# Patient Record
Sex: Female | Born: 1955 | Race: White | Hispanic: No | Marital: Married | Smoking: Former smoker
Health system: Southern US, Community
[De-identification: ages and names within clinical notes are randomized; demographics above are authoritative.]

## PROBLEM LIST (undated history)

## (undated) DIAGNOSIS — J45909 Unspecified asthma, uncomplicated: Secondary | ICD-10-CM

## (undated) DIAGNOSIS — M810 Age-related osteoporosis without current pathological fracture: Secondary | ICD-10-CM

## (undated) DIAGNOSIS — K219 Gastro-esophageal reflux disease without esophagitis: Secondary | ICD-10-CM

## (undated) DIAGNOSIS — F32A Depression, unspecified: Secondary | ICD-10-CM

## (undated) DIAGNOSIS — Q613 Polycystic kidney, unspecified: Secondary | ICD-10-CM

## (undated) DIAGNOSIS — M109 Gout, unspecified: Secondary | ICD-10-CM

## (undated) DIAGNOSIS — I10 Essential (primary) hypertension: Secondary | ICD-10-CM

## (undated) DIAGNOSIS — M1990 Unspecified osteoarthritis, unspecified site: Secondary | ICD-10-CM

## (undated) DIAGNOSIS — F329 Major depressive disorder, single episode, unspecified: Secondary | ICD-10-CM

## (undated) DIAGNOSIS — J449 Chronic obstructive pulmonary disease, unspecified: Secondary | ICD-10-CM

## (undated) DIAGNOSIS — Z9889 Other specified postprocedural states: Secondary | ICD-10-CM

## (undated) DIAGNOSIS — R112 Nausea with vomiting, unspecified: Secondary | ICD-10-CM

## (undated) DIAGNOSIS — E785 Hyperlipidemia, unspecified: Secondary | ICD-10-CM

## (undated) DIAGNOSIS — I509 Heart failure, unspecified: Secondary | ICD-10-CM

## (undated) DIAGNOSIS — E213 Hyperparathyroidism, unspecified: Secondary | ICD-10-CM

## (undated) HISTORY — DX: Unspecified osteoarthritis, unspecified site: M19.90

## (undated) HISTORY — DX: Major depressive disorder, single episode, unspecified: F32.9

## (undated) HISTORY — PX: BILATERAL OOPHORECTOMY: SHX1221

## (undated) HISTORY — DX: Gastro-esophageal reflux disease without esophagitis: K21.9

## (undated) HISTORY — PX: KIDNEY TRANSPLANT: SHX239

## (undated) HISTORY — DX: Hyperlipidemia, unspecified: E78.5

## (undated) HISTORY — DX: Depression, unspecified: F32.A

## (undated) HISTORY — DX: Polycystic kidney, unspecified: Q61.3

## (undated) HISTORY — DX: Unspecified asthma, uncomplicated: J45.909

## (undated) HISTORY — DX: Essential (primary) hypertension: I10

## (undated) HISTORY — DX: Hyperparathyroidism, unspecified: E21.3

## (undated) HISTORY — DX: Gout, unspecified: M10.9

## (undated) HISTORY — DX: Age-related osteoporosis without current pathological fracture: M81.0

## (undated) NOTE — *Deleted (*Deleted)
Patient had a very large amount of bright red blood in her stool.   MD made aware.  Will continue to monitor closely.

---
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
## 1998-11-23 HISTORY — PX: CARPAL TUNNEL RELEASE: SHX101

## 1999-03-18 ENCOUNTER — Inpatient Hospital Stay (HOSPITAL_COMMUNITY): Admission: RE | Admit: 1999-03-18 | Discharge: 1999-03-18 | Payer: Self-pay | Admitting: *Deleted

## 1999-03-21 ENCOUNTER — Encounter (HOSPITAL_COMMUNITY): Admission: RE | Admit: 1999-03-21 | Discharge: 1999-06-19 | Payer: Self-pay | Admitting: Obstetrics

## 1999-05-25 ENCOUNTER — Inpatient Hospital Stay (HOSPITAL_COMMUNITY): Admission: AD | Admit: 1999-05-25 | Discharge: 1999-05-25 | Payer: Self-pay | Admitting: Obstetrics

## 1999-06-03 ENCOUNTER — Inpatient Hospital Stay (HOSPITAL_COMMUNITY): Admission: AD | Admit: 1999-06-03 | Discharge: 1999-06-03 | Payer: Self-pay | Admitting: *Deleted

## 1999-06-06 ENCOUNTER — Encounter: Payer: Self-pay | Admitting: Obstetrics

## 1999-06-06 ENCOUNTER — Encounter (HOSPITAL_COMMUNITY): Admission: RE | Admit: 1999-06-06 | Discharge: 1999-07-02 | Payer: Self-pay | Admitting: *Deleted

## 1999-06-20 ENCOUNTER — Encounter (HOSPITAL_COMMUNITY): Admission: RE | Admit: 1999-06-20 | Discharge: 1999-09-18 | Payer: Self-pay | Admitting: *Deleted

## 1999-07-08 ENCOUNTER — Observation Stay (HOSPITAL_COMMUNITY): Admission: AD | Admit: 1999-07-08 | Discharge: 1999-07-09 | Payer: Self-pay | Admitting: *Deleted

## 1999-07-21 ENCOUNTER — Inpatient Hospital Stay (HOSPITAL_COMMUNITY): Admission: AD | Admit: 1999-07-21 | Discharge: 1999-07-23 | Payer: Self-pay | Admitting: *Deleted

## 1999-07-24 ENCOUNTER — Inpatient Hospital Stay (HOSPITAL_COMMUNITY): Admission: AD | Admit: 1999-07-24 | Discharge: 1999-07-28 | Payer: Self-pay | Admitting: *Deleted

## 1999-08-12 ENCOUNTER — Encounter: Payer: Self-pay | Admitting: *Deleted

## 1999-08-29 ENCOUNTER — Encounter: Admission: RE | Admit: 1999-08-29 | Discharge: 1999-11-27 | Payer: Self-pay | Admitting: *Deleted

## 1999-09-16 ENCOUNTER — Encounter: Payer: Self-pay | Admitting: *Deleted

## 1999-09-23 ENCOUNTER — Encounter (HOSPITAL_COMMUNITY): Admission: RE | Admit: 1999-09-23 | Discharge: 1999-10-06 | Payer: Self-pay | Admitting: *Deleted

## 1999-09-23 ENCOUNTER — Encounter: Payer: Self-pay | Admitting: *Deleted

## 1999-10-03 ENCOUNTER — Inpatient Hospital Stay (HOSPITAL_COMMUNITY): Admission: AD | Admit: 1999-10-03 | Discharge: 1999-10-06 | Payer: Self-pay | Admitting: *Deleted

## 1999-10-04 ENCOUNTER — Encounter: Payer: Self-pay | Admitting: *Deleted

## 1999-10-07 ENCOUNTER — Encounter: Payer: Self-pay | Admitting: *Deleted

## 1999-10-07 ENCOUNTER — Encounter (HOSPITAL_COMMUNITY): Admission: RE | Admit: 1999-10-07 | Discharge: 1999-11-05 | Payer: Self-pay | Admitting: *Deleted

## 1999-10-10 ENCOUNTER — Encounter: Payer: Self-pay | Admitting: *Deleted

## 1999-10-21 ENCOUNTER — Encounter: Payer: Self-pay | Admitting: *Deleted

## 1999-10-28 ENCOUNTER — Inpatient Hospital Stay (HOSPITAL_COMMUNITY): Admission: AD | Admit: 1999-10-28 | Discharge: 1999-11-04 | Payer: Self-pay | Admitting: *Deleted

## 1999-10-28 ENCOUNTER — Encounter (INDEPENDENT_AMBULATORY_CARE_PROVIDER_SITE_OTHER): Payer: Self-pay | Admitting: Specialist

## 1999-11-06 ENCOUNTER — Inpatient Hospital Stay (HOSPITAL_COMMUNITY): Admission: AD | Admit: 1999-11-06 | Discharge: 1999-11-09 | Payer: Self-pay | Admitting: Obstetrics & Gynecology

## 1999-11-13 ENCOUNTER — Encounter: Admission: RE | Admit: 1999-11-13 | Discharge: 1999-11-13 | Payer: Self-pay | Admitting: Family Medicine

## 1999-11-20 ENCOUNTER — Encounter: Admission: RE | Admit: 1999-11-20 | Discharge: 1999-11-20 | Payer: Self-pay | Admitting: Family Medicine

## 1999-11-28 ENCOUNTER — Encounter: Admission: RE | Admit: 1999-11-28 | Discharge: 1999-11-28 | Payer: Self-pay | Admitting: Family Medicine

## 1999-12-04 ENCOUNTER — Ambulatory Visit (HOSPITAL_COMMUNITY): Admission: RE | Admit: 1999-12-04 | Discharge: 1999-12-04 | Payer: Self-pay | Admitting: Nephrology

## 1999-12-04 ENCOUNTER — Encounter: Payer: Self-pay | Admitting: Nephrology

## 1999-12-09 ENCOUNTER — Inpatient Hospital Stay (HOSPITAL_COMMUNITY): Admission: EM | Admit: 1999-12-09 | Discharge: 1999-12-09 | Payer: Self-pay | Admitting: *Deleted

## 1999-12-09 ENCOUNTER — Encounter (INDEPENDENT_AMBULATORY_CARE_PROVIDER_SITE_OTHER): Payer: Self-pay

## 2000-01-01 ENCOUNTER — Encounter: Admission: RE | Admit: 2000-01-01 | Discharge: 2000-01-01 | Payer: Self-pay | Admitting: Family Medicine

## 2000-03-09 ENCOUNTER — Encounter: Admission: RE | Admit: 2000-03-09 | Discharge: 2000-03-09 | Payer: Self-pay | Admitting: Critical Care Medicine

## 2000-03-09 ENCOUNTER — Encounter: Payer: Self-pay | Admitting: Nephrology

## 2000-03-18 ENCOUNTER — Encounter: Payer: Self-pay | Admitting: Nephrology

## 2000-03-18 ENCOUNTER — Encounter: Admission: RE | Admit: 2000-03-18 | Discharge: 2000-03-18 | Payer: Self-pay | Admitting: Nephrology

## 2000-04-29 ENCOUNTER — Emergency Department (HOSPITAL_COMMUNITY): Admission: EM | Admit: 2000-04-29 | Discharge: 2000-04-29 | Payer: Self-pay | Admitting: Emergency Medicine

## 2000-06-04 ENCOUNTER — Emergency Department (HOSPITAL_COMMUNITY): Admission: EM | Admit: 2000-06-04 | Discharge: 2000-06-04 | Payer: Self-pay | Admitting: Emergency Medicine

## 2000-06-04 ENCOUNTER — Encounter: Payer: Self-pay | Admitting: Emergency Medicine

## 2000-08-24 ENCOUNTER — Encounter: Admission: RE | Admit: 2000-08-24 | Discharge: 2000-08-24 | Payer: Self-pay | Admitting: Family Medicine

## 2001-05-17 ENCOUNTER — Encounter: Admission: RE | Admit: 2001-05-17 | Discharge: 2001-05-17 | Payer: Self-pay | Admitting: Family Medicine

## 2001-05-18 ENCOUNTER — Encounter: Admission: RE | Admit: 2001-05-18 | Discharge: 2001-05-18 | Payer: Self-pay | Admitting: Sports Medicine

## 2001-05-18 ENCOUNTER — Encounter: Payer: Self-pay | Admitting: Sports Medicine

## 2001-05-19 ENCOUNTER — Encounter: Admission: RE | Admit: 2001-05-19 | Discharge: 2001-05-19 | Payer: Self-pay | Admitting: Family Medicine

## 2001-05-20 ENCOUNTER — Encounter: Payer: Self-pay | Admitting: Sports Medicine

## 2001-05-20 ENCOUNTER — Encounter: Admission: RE | Admit: 2001-05-20 | Discharge: 2001-05-20 | Payer: Self-pay | Admitting: Sports Medicine

## 2002-01-24 ENCOUNTER — Encounter: Payer: Self-pay | Admitting: Emergency Medicine

## 2002-01-24 ENCOUNTER — Emergency Department (HOSPITAL_COMMUNITY): Admission: EM | Admit: 2002-01-24 | Discharge: 2002-01-25 | Payer: Self-pay | Admitting: Emergency Medicine

## 2002-01-25 ENCOUNTER — Encounter: Payer: Self-pay | Admitting: Emergency Medicine

## 2002-02-07 ENCOUNTER — Encounter: Payer: Self-pay | Admitting: Nephrology

## 2002-02-07 ENCOUNTER — Encounter: Admission: RE | Admit: 2002-02-07 | Discharge: 2002-02-07 | Payer: Self-pay | Admitting: Nephrology

## 2002-04-25 ENCOUNTER — Other Ambulatory Visit: Admission: RE | Admit: 2002-04-25 | Discharge: 2002-04-25 | Payer: Self-pay | Admitting: Obstetrics & Gynecology

## 2002-05-01 ENCOUNTER — Encounter: Payer: Self-pay | Admitting: Obstetrics & Gynecology

## 2002-05-01 ENCOUNTER — Ambulatory Visit (HOSPITAL_COMMUNITY): Admission: RE | Admit: 2002-05-01 | Discharge: 2002-05-01 | Payer: Self-pay | Admitting: Obstetrics & Gynecology

## 2002-10-18 ENCOUNTER — Encounter: Payer: Self-pay | Admitting: Emergency Medicine

## 2002-10-18 ENCOUNTER — Emergency Department (HOSPITAL_COMMUNITY): Admission: EM | Admit: 2002-10-18 | Discharge: 2002-10-18 | Payer: Self-pay | Admitting: Emergency Medicine

## 2002-10-25 ENCOUNTER — Encounter: Payer: Self-pay | Admitting: Emergency Medicine

## 2002-10-25 ENCOUNTER — Emergency Department (HOSPITAL_COMMUNITY): Admission: EM | Admit: 2002-10-25 | Discharge: 2002-10-25 | Payer: Self-pay | Admitting: Emergency Medicine

## 2002-10-27 ENCOUNTER — Encounter: Payer: Self-pay | Admitting: Internal Medicine

## 2002-10-27 ENCOUNTER — Inpatient Hospital Stay (HOSPITAL_COMMUNITY): Admission: AD | Admit: 2002-10-27 | Discharge: 2002-10-31 | Payer: Self-pay | Admitting: Internal Medicine

## 2002-10-28 ENCOUNTER — Encounter: Payer: Self-pay | Admitting: Internal Medicine

## 2003-06-05 ENCOUNTER — Encounter: Payer: Self-pay | Admitting: Obstetrics & Gynecology

## 2003-06-05 ENCOUNTER — Ambulatory Visit (HOSPITAL_COMMUNITY): Admission: RE | Admit: 2003-06-05 | Discharge: 2003-06-05 | Payer: Self-pay | Admitting: Obstetrics & Gynecology

## 2003-06-08 ENCOUNTER — Encounter: Admission: RE | Admit: 2003-06-08 | Discharge: 2003-06-08 | Payer: Self-pay | Admitting: Rheumatology

## 2003-06-08 ENCOUNTER — Encounter: Payer: Self-pay | Admitting: Rheumatology

## 2003-06-12 ENCOUNTER — Encounter: Payer: Self-pay | Admitting: Rheumatology

## 2003-06-12 ENCOUNTER — Encounter: Admission: RE | Admit: 2003-06-12 | Discharge: 2003-06-12 | Payer: Self-pay | Admitting: Rheumatology

## 2003-07-09 ENCOUNTER — Encounter (HOSPITAL_COMMUNITY): Admission: RE | Admit: 2003-07-09 | Discharge: 2003-10-07 | Payer: Self-pay | Admitting: Nephrology

## 2003-08-30 ENCOUNTER — Ambulatory Visit (HOSPITAL_COMMUNITY): Admission: RE | Admit: 2003-08-30 | Discharge: 2003-08-30 | Payer: Self-pay | Admitting: Obstetrics & Gynecology

## 2003-08-30 ENCOUNTER — Encounter: Payer: Self-pay | Admitting: Obstetrics & Gynecology

## 2003-10-15 ENCOUNTER — Emergency Department (HOSPITAL_COMMUNITY): Admission: EM | Admit: 2003-10-15 | Discharge: 2003-10-16 | Payer: Self-pay | Admitting: Emergency Medicine

## 2003-10-17 ENCOUNTER — Ambulatory Visit (HOSPITAL_BASED_OUTPATIENT_CLINIC_OR_DEPARTMENT_OTHER): Admission: RE | Admit: 2003-10-17 | Discharge: 2003-10-17 | Payer: Self-pay | Admitting: *Deleted

## 2004-04-27 ENCOUNTER — Emergency Department (HOSPITAL_COMMUNITY): Admission: EM | Admit: 2004-04-27 | Discharge: 2004-04-27 | Payer: Self-pay | Admitting: Emergency Medicine

## 2004-12-23 ENCOUNTER — Encounter: Admission: RE | Admit: 2004-12-23 | Discharge: 2004-12-23 | Payer: Self-pay | Admitting: Internal Medicine

## 2005-07-15 ENCOUNTER — Emergency Department (HOSPITAL_COMMUNITY): Admission: EM | Admit: 2005-07-15 | Discharge: 2005-07-15 | Payer: Self-pay | Admitting: Emergency Medicine

## 2006-01-10 ENCOUNTER — Emergency Department (HOSPITAL_COMMUNITY): Admission: EM | Admit: 2006-01-10 | Discharge: 2006-01-10 | Payer: Self-pay | Admitting: Family Medicine

## 2006-06-29 IMAGING — CR DG CHEST 2V
3 series · 3 of 3 positions shown · non-contrast
Comparison: none

CLINICAL DATA: Chest pain. 
 CHEST ? TWO VIEW:
 Two views of the chest are compared to a chest x-ray of 05/09/03.  Basilar linear opacities are noted consistent with atelectasis or scarring.  No effusion is seen.  The heart is within normal limits in size.

[view not recorded (1 of 3)]
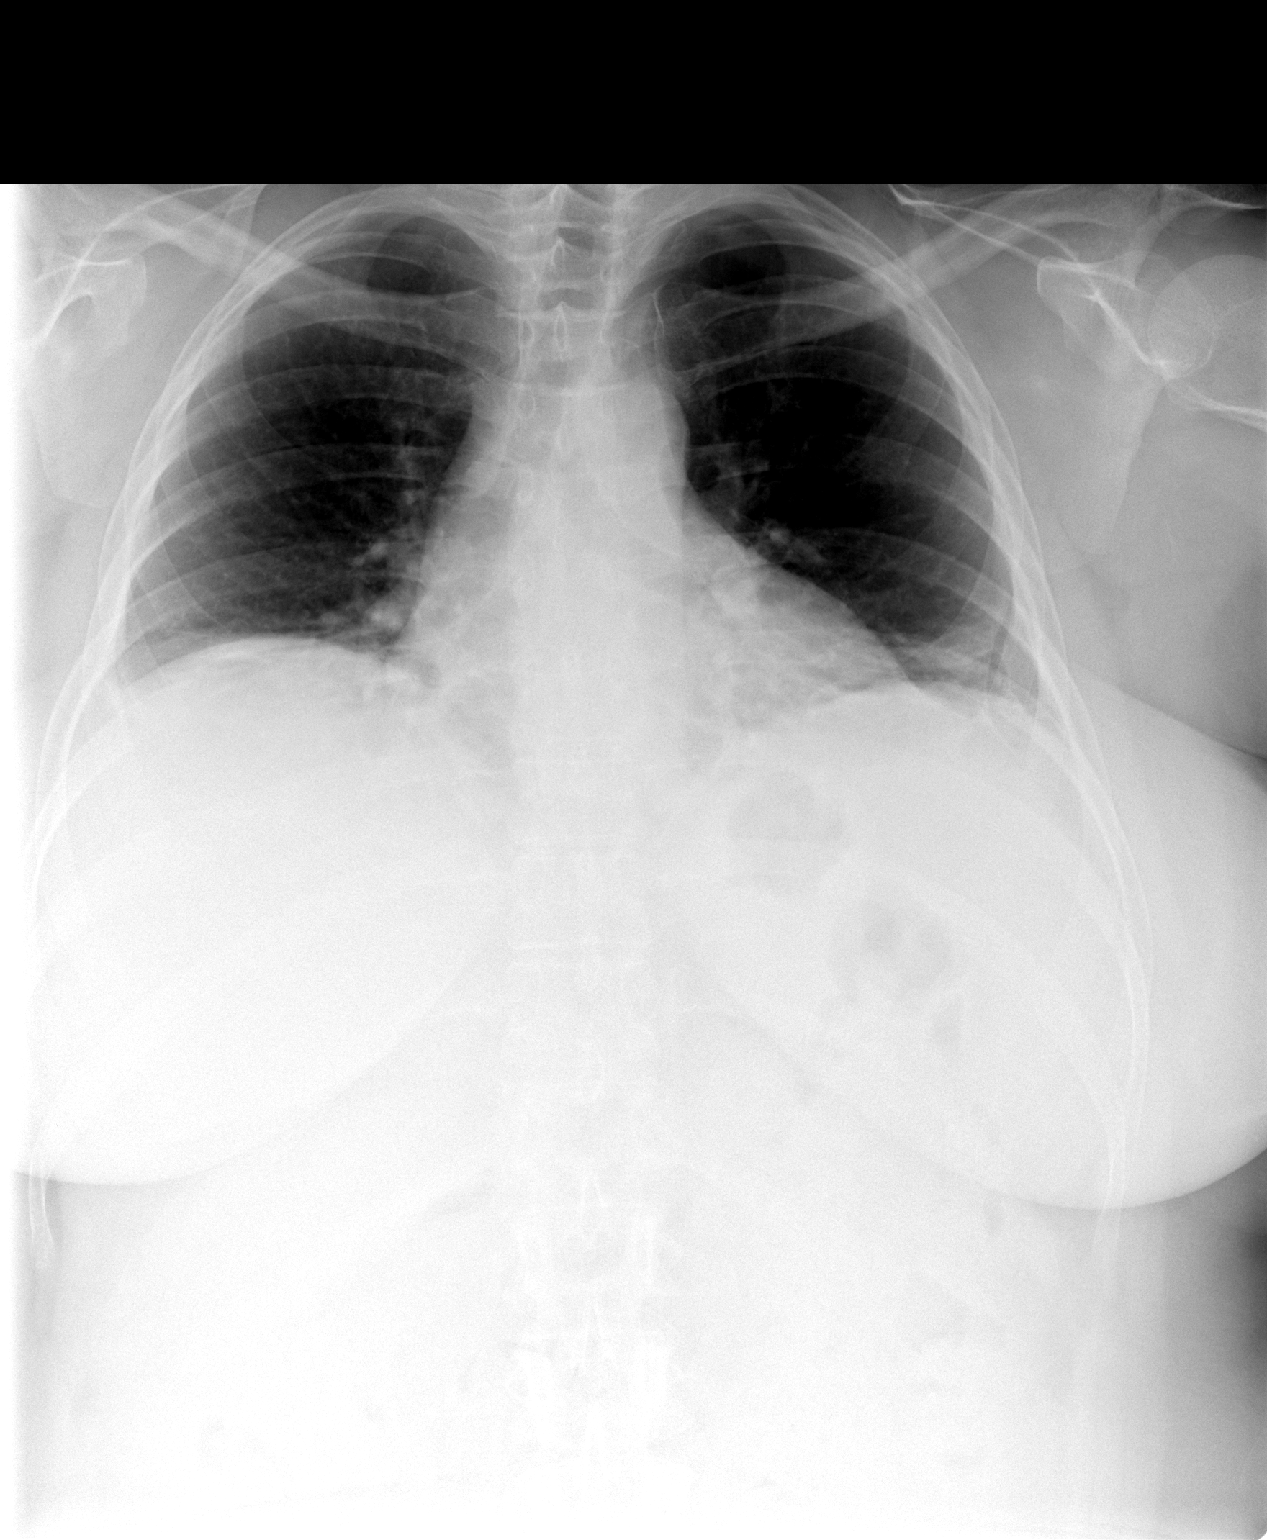

[view not recorded (2 of 3)]
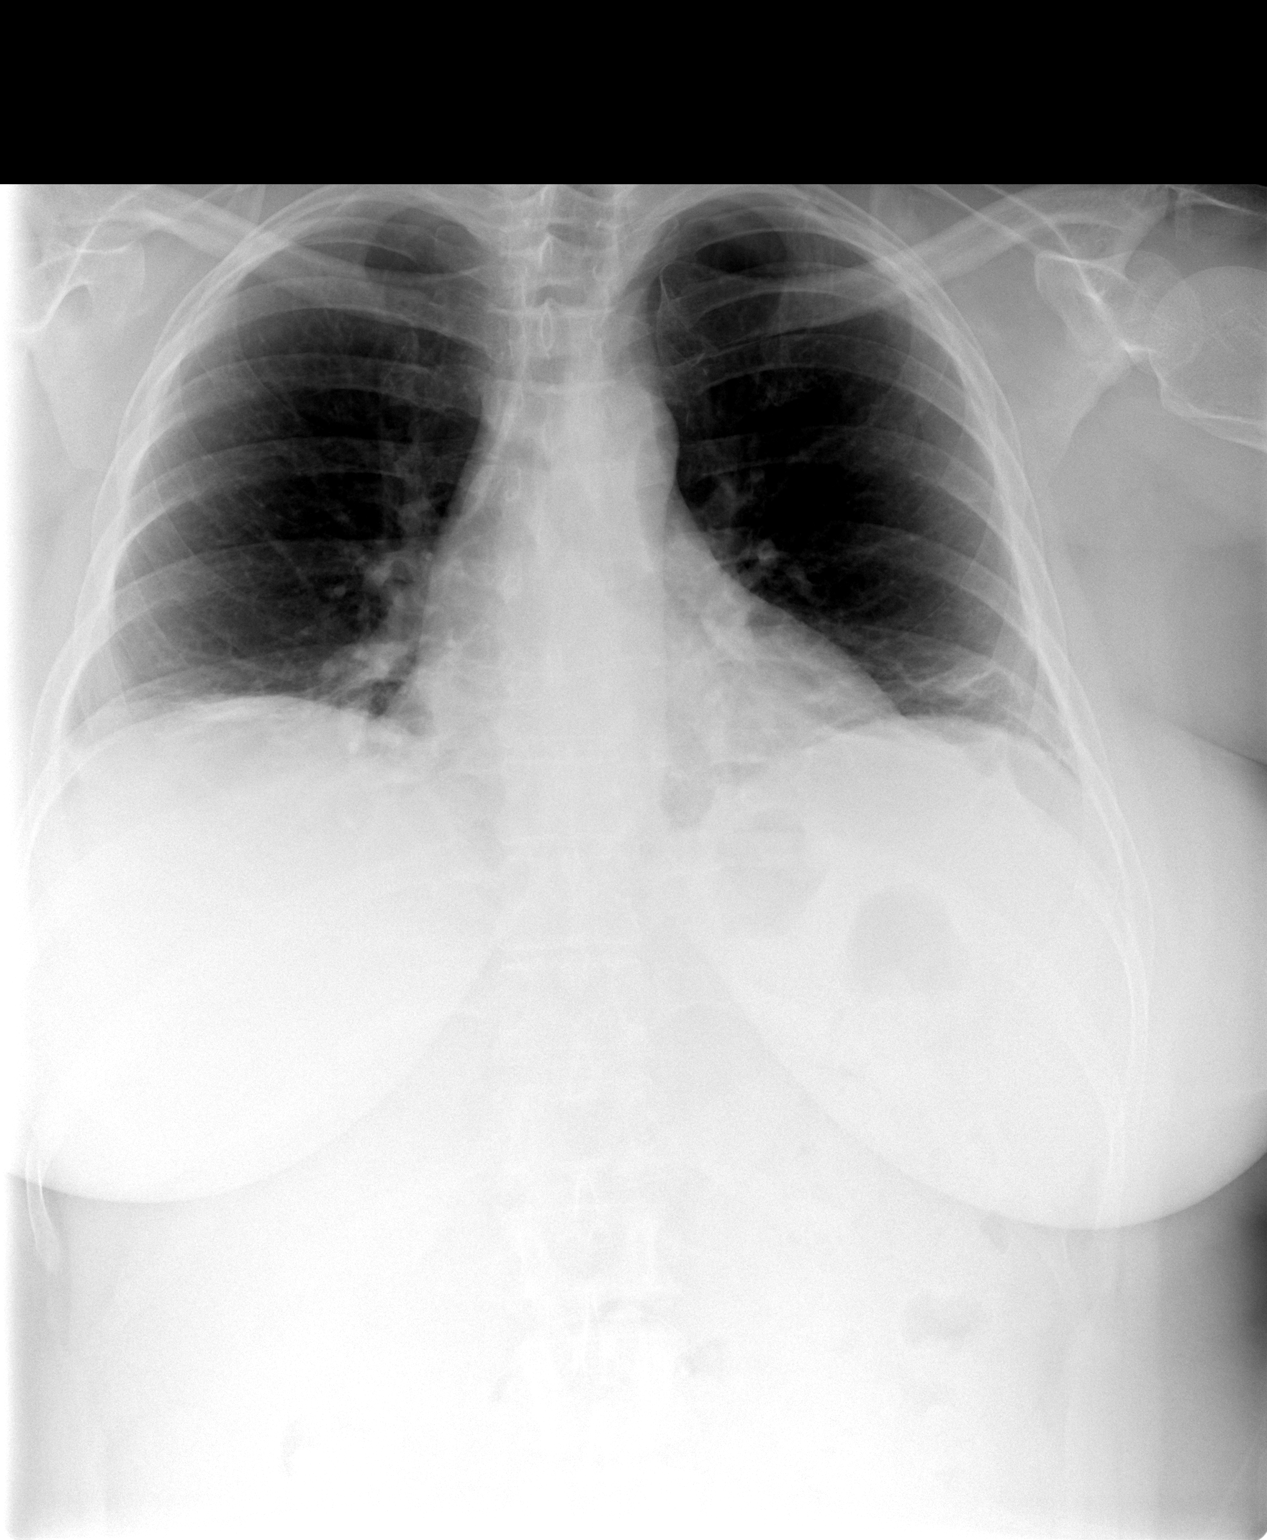

[view not recorded (3 of 3)]
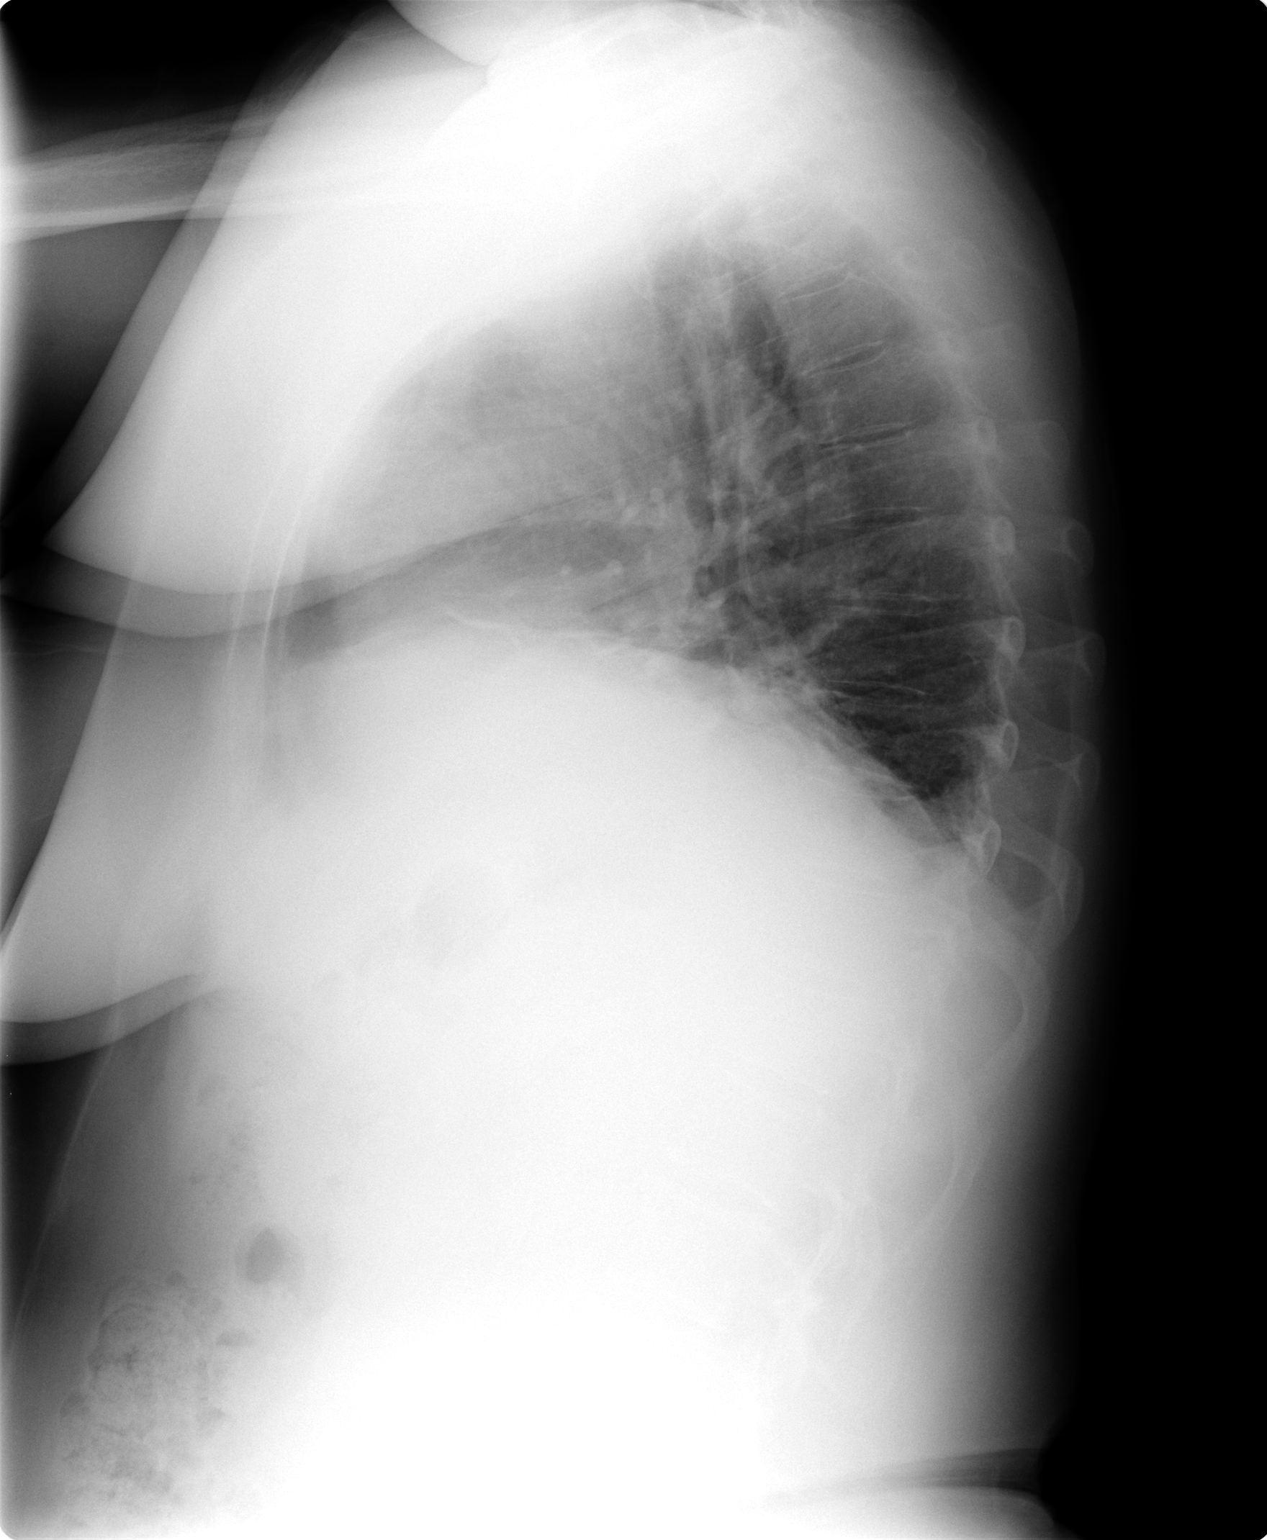

[3 of 3 positions shown; findings below may reference images not displayed]

IMPRESSION: Basilar linear opacities consistent with atelectasis or scarring.  Suboptimal inspiration.

## 2006-07-28 ENCOUNTER — Encounter: Admission: RE | Admit: 2006-07-28 | Discharge: 2006-07-28 | Payer: Self-pay | Admitting: Family Medicine

## 2006-11-11 ENCOUNTER — Encounter: Admission: RE | Admit: 2006-11-11 | Discharge: 2006-11-11 | Payer: Self-pay | Admitting: Family Medicine

## 2006-11-12 ENCOUNTER — Ambulatory Visit (HOSPITAL_COMMUNITY): Admission: RE | Admit: 2006-11-12 | Discharge: 2006-11-12 | Payer: Self-pay | Admitting: Nephrology

## 2006-12-22 ENCOUNTER — Encounter: Admission: RE | Admit: 2006-12-22 | Discharge: 2006-12-22 | Payer: Self-pay | Admitting: Internal Medicine

## 2008-02-01 IMAGING — CR DG LUMBAR SPINE COMPLETE 4+V
5 series · 5 of 5 positions shown · non-contrast
Comparison: None.

LUMBAR SPINE - 4  VIEW:

CLINICAL DATA: Right-sided back pain running into the right hip and leg.

[t l-spine a.p.]
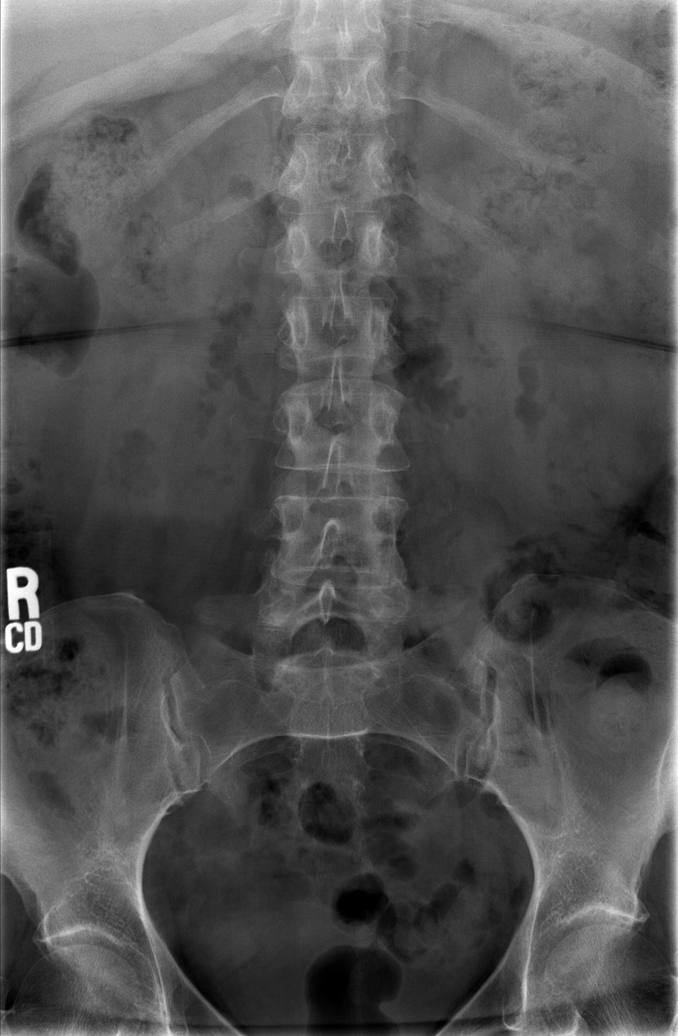

[t l-spine oblique exposure (1 of 2)]
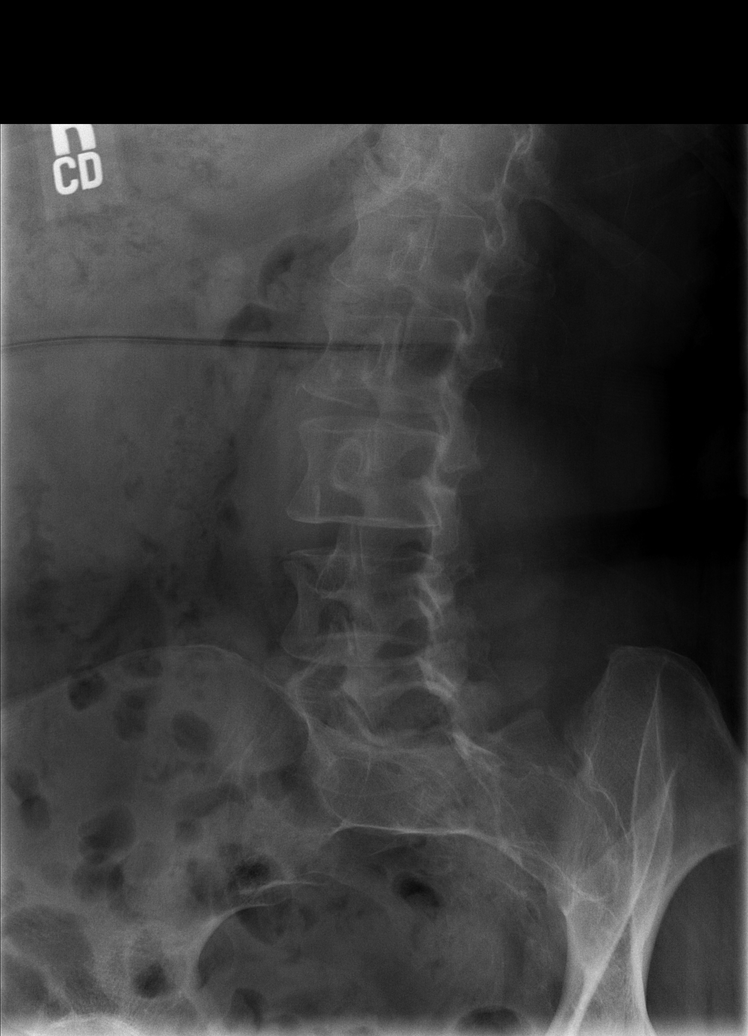

[t l-spine oblique exposure (2 of 2)]
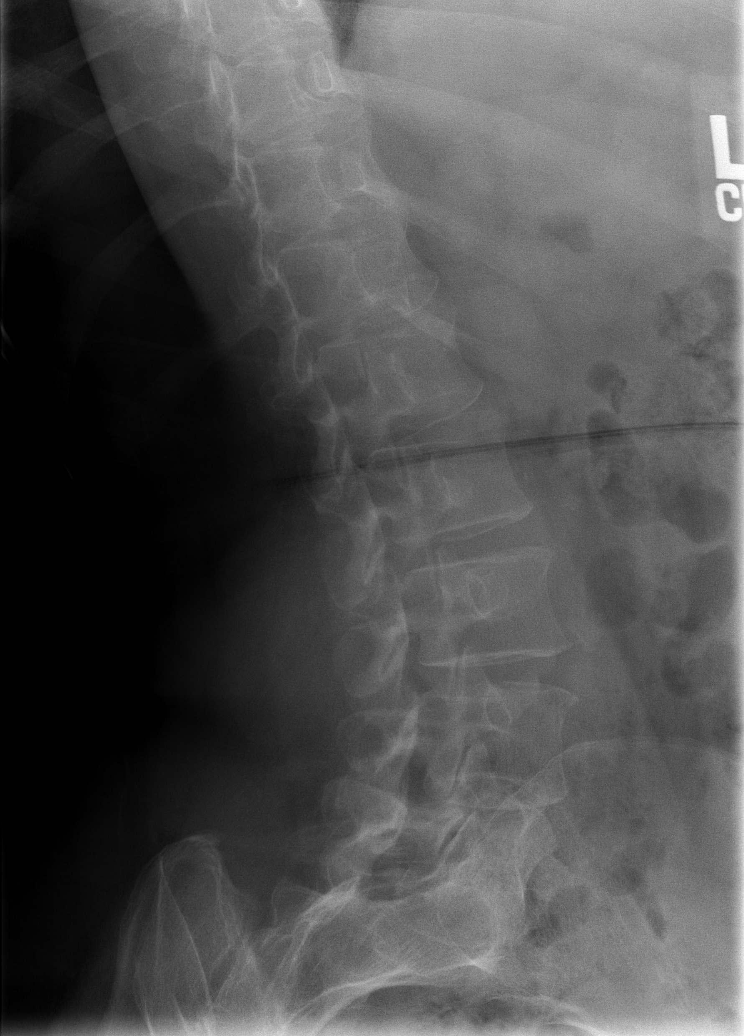

[t l-spine lat]
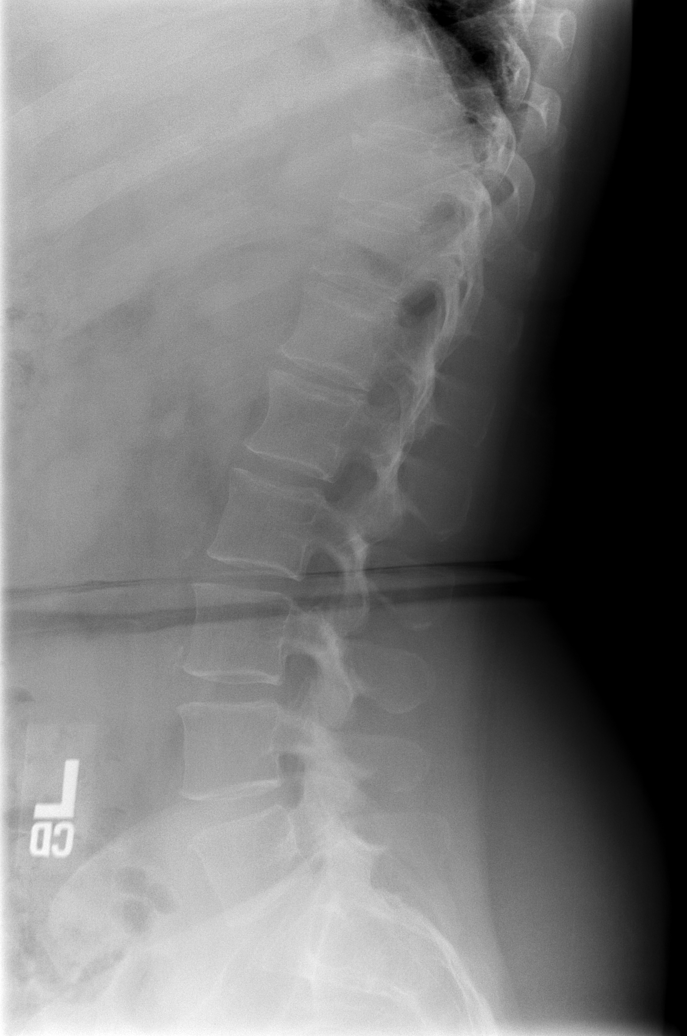

[t l-spine l5-s1 spot]
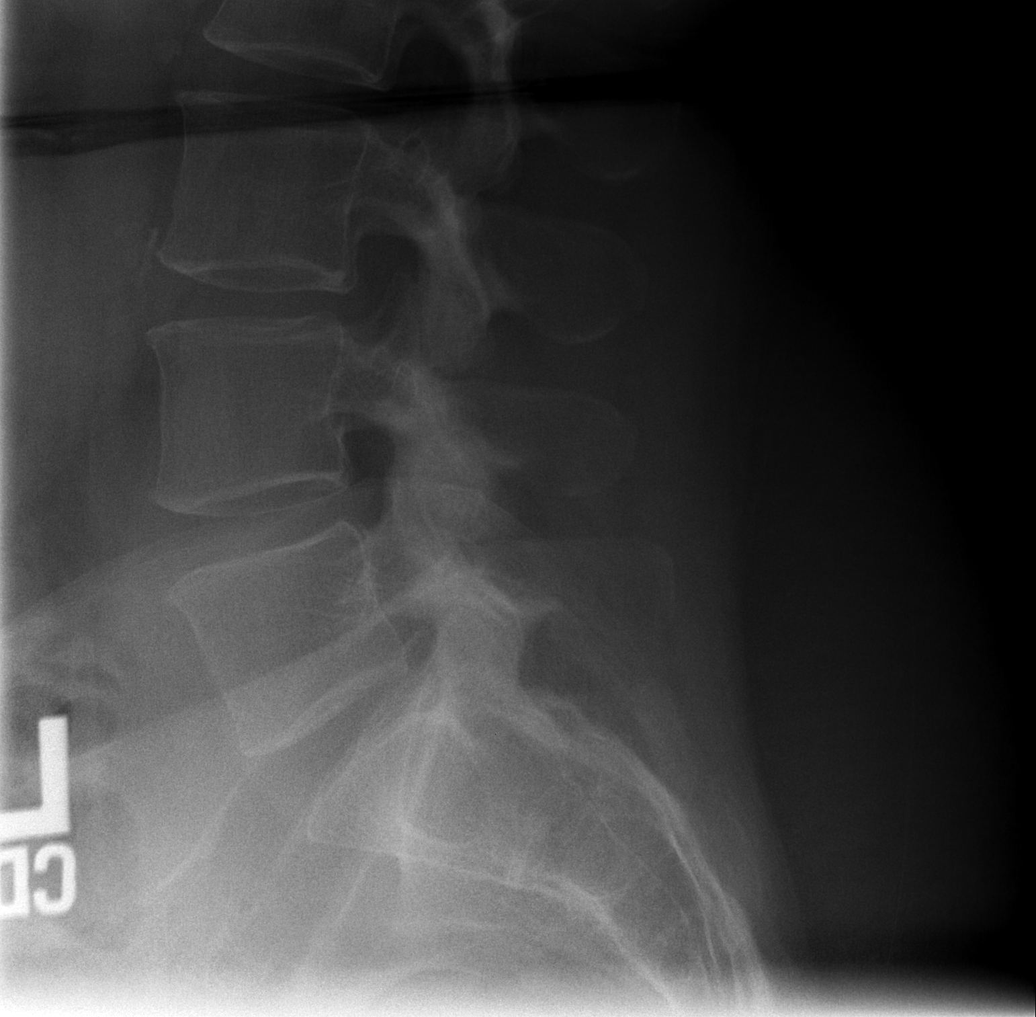

[5 of 5 positions shown; findings below may reference images not displayed]

FINDINGS: There are 5 nonrib-bearing lumbar type vertebral bodies.  Alignment
is normal and intervertebral disc spaces are preserved.  SI joints are
unremarkable.
IMPRESSION: No acute bony abnormality.

## 2008-02-01 IMAGING — CR DG HIP COMPLETE 2+V*R*
2 series · 2 of 2 positions shown · non-contrast
Comparison: none

CLINICAL DATA: Right hip pain

RIGHT HIP - 2 VIEW:

[t hip ap right]
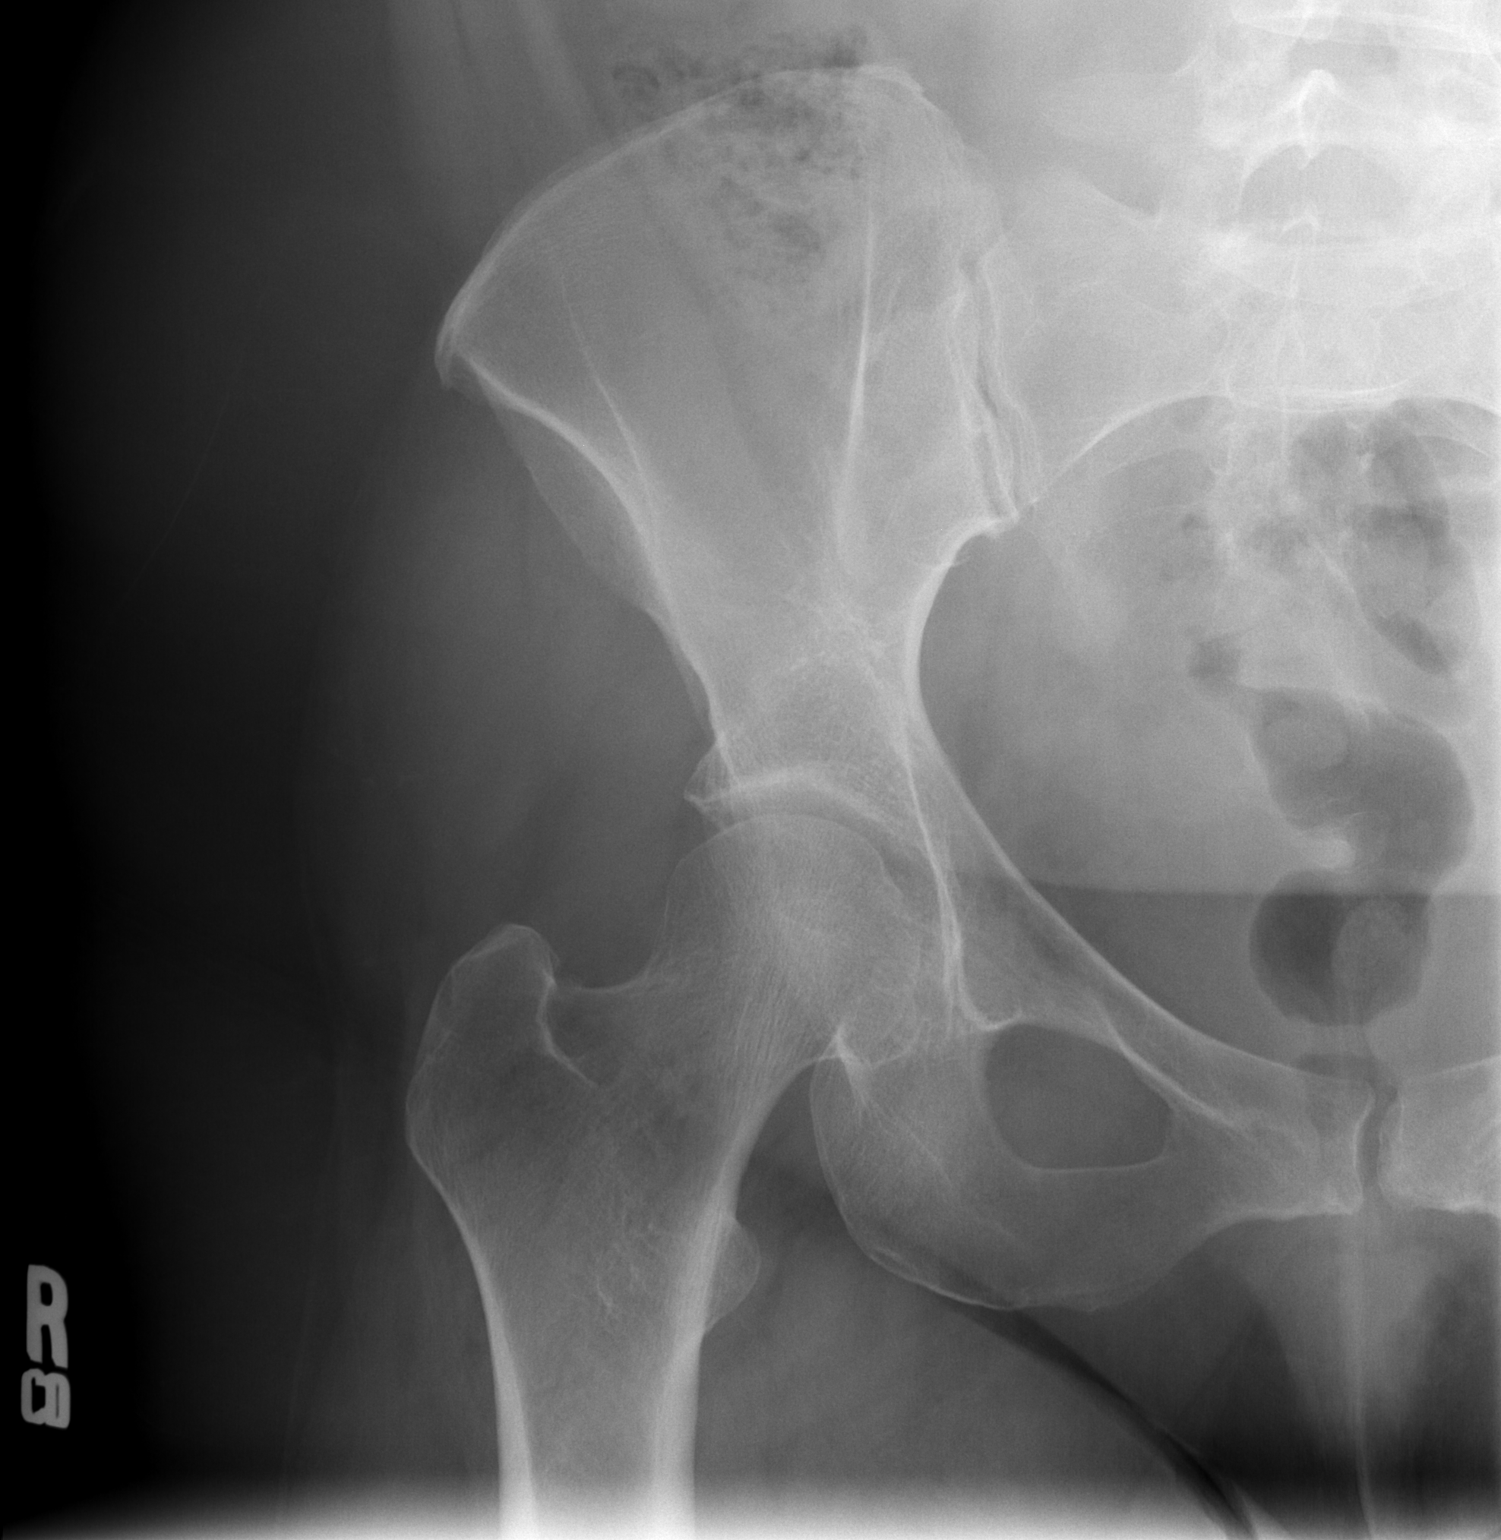

[t hip frog leg right]
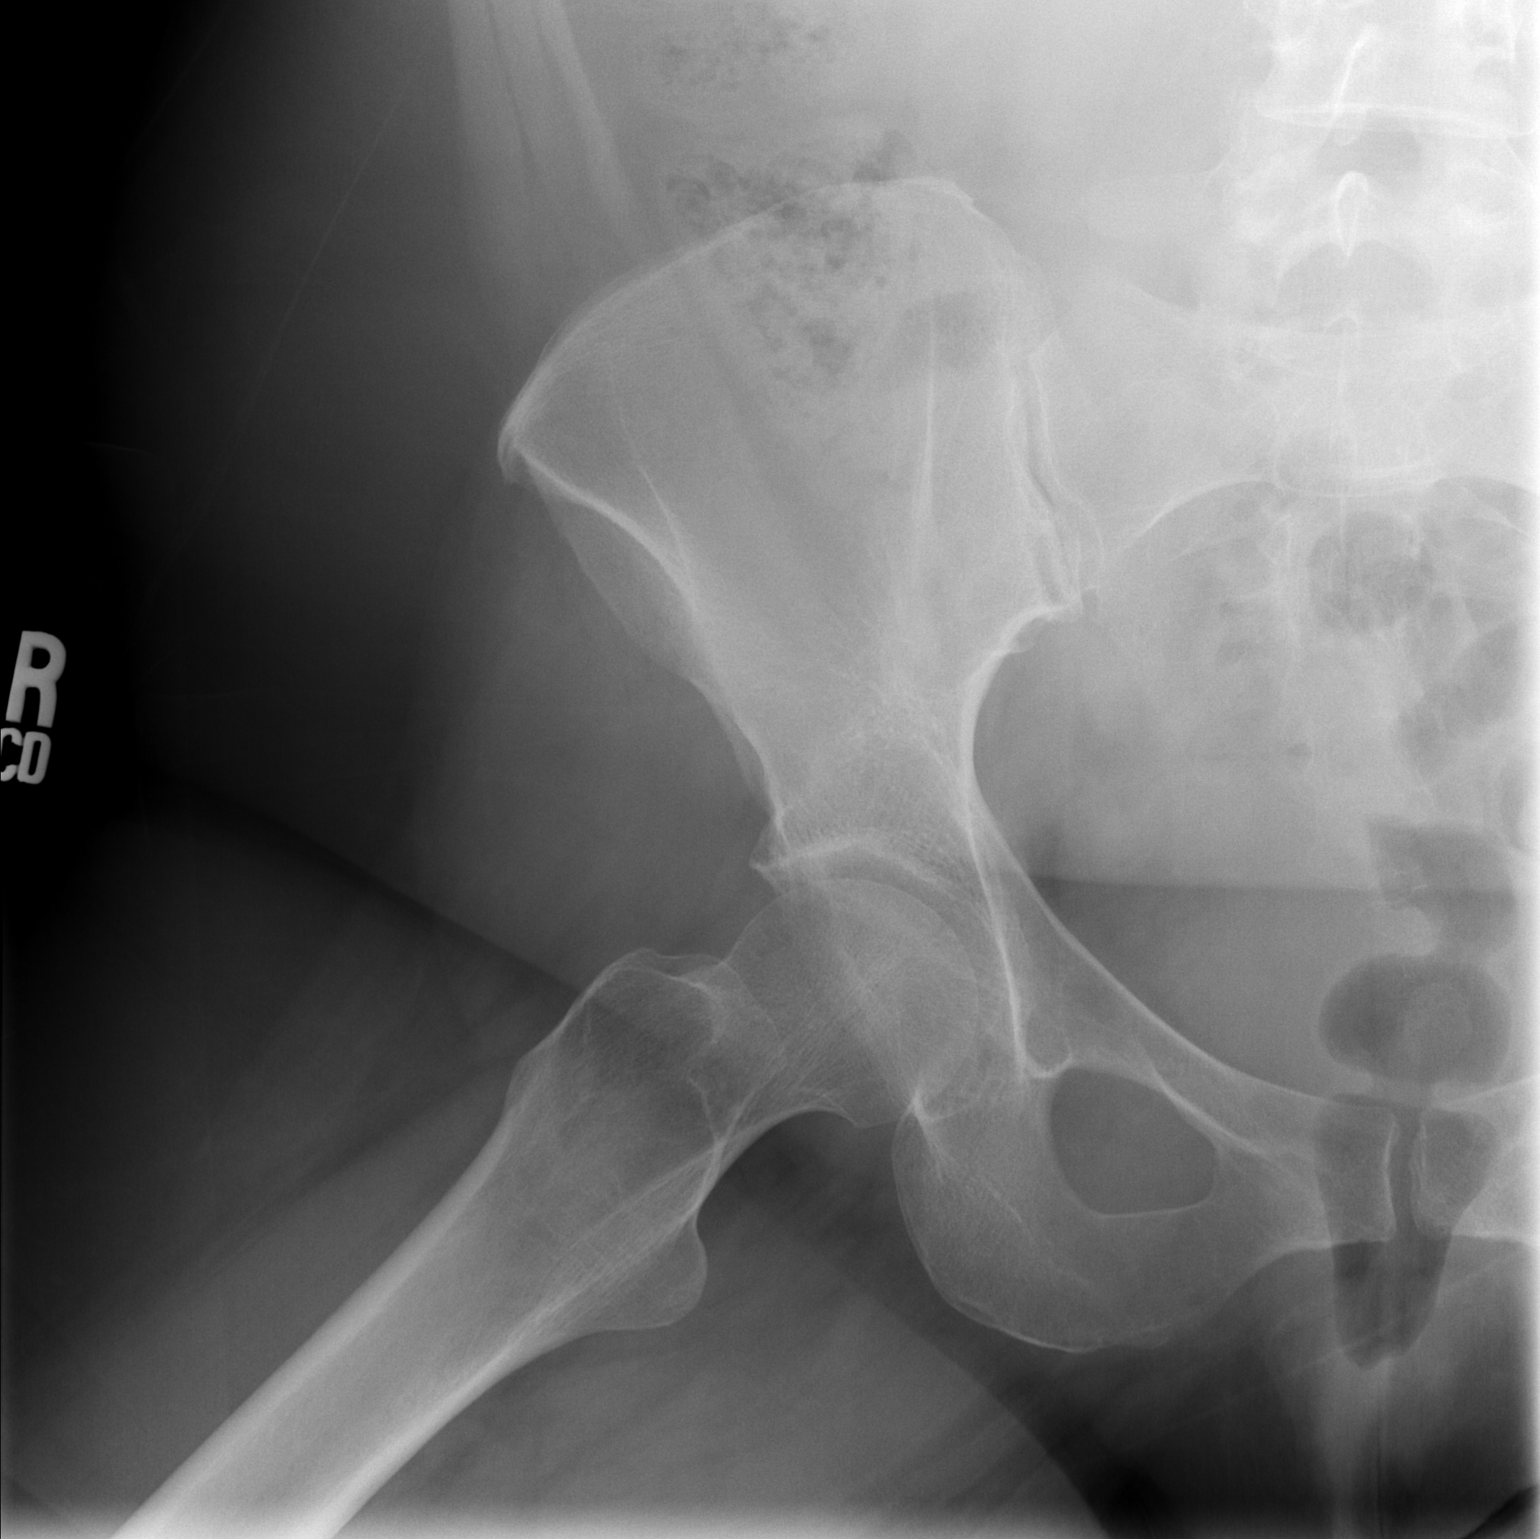

[2 of 2 positions shown; findings below may reference images not displayed]

FINDINGS: AP and frogleg lateral views of the right hip show no acute fracture.
Joint space is preserved. No focal bony abnormality identified. The right SI
joint is unremarkable.
IMPRESSION: No findings to explain this patient's history of pain.

## 2008-05-17 IMAGING — CR DG RIBS 2V*R*
2 series · 2 of 2 positions shown · non-contrast
Comparison: [REDACTED] chest x-ray 12/23/04.
COMPARISON: Chest x-ray 11/11/06.

CLINICAL DATA: Right pleuritic chest pain.  No trauma. 
 DIAGNOSTIC CHEST - 2 VIEW:

[w ribs ap/pa lower right]
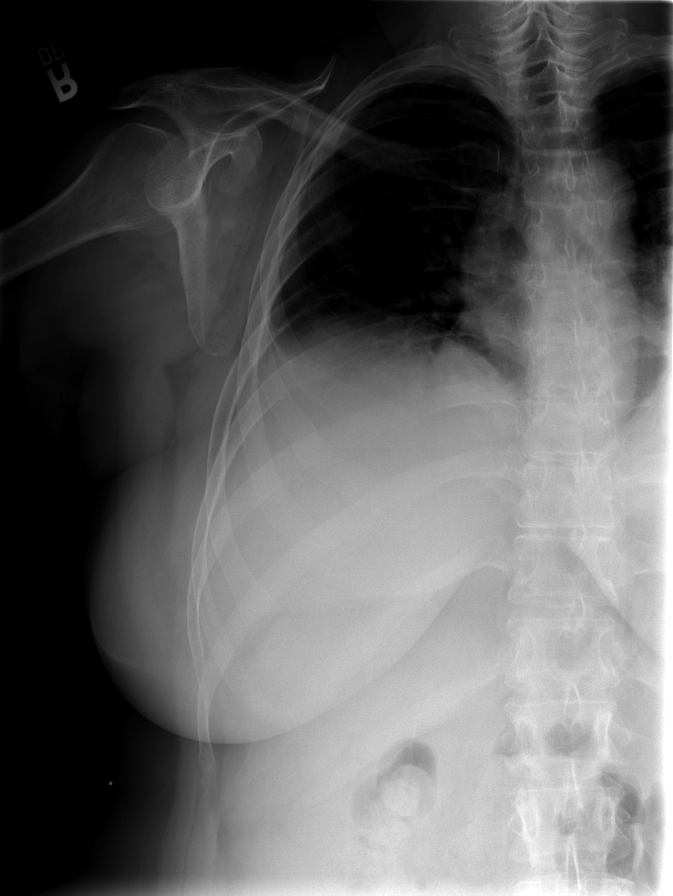

[w ribs oblique right]
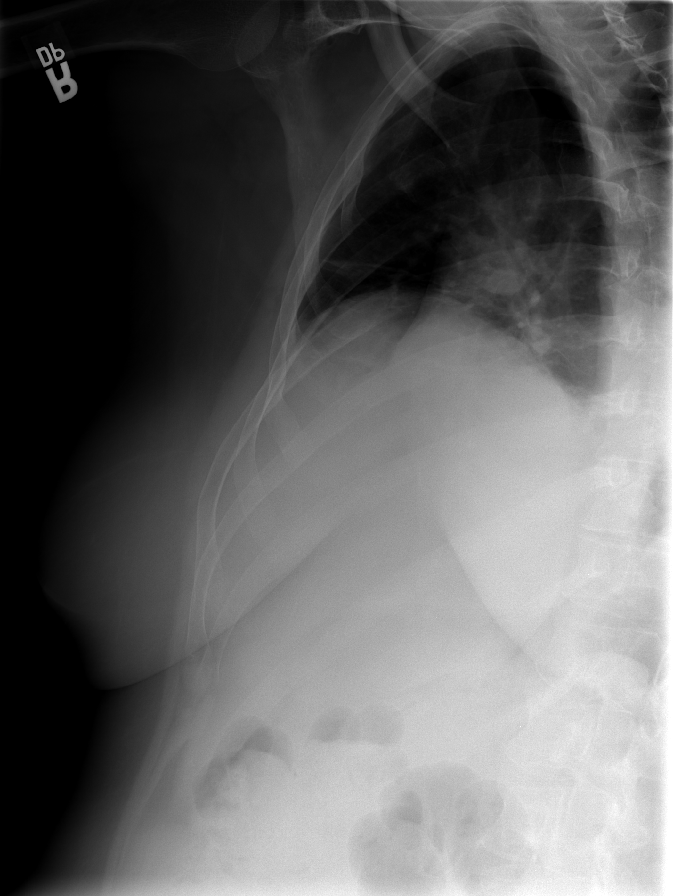

[2 of 2 positions shown; findings below may reference images not displayed]

FINDINGS: Lesser yet persistent linear atelectasis or scarring is seen at the bilateral lung bases with submaximal inspiration and the lungs otherwise clear.  No pneumothorax is seen.  Heart size remains normal.  Mediastinum, hila, pleural remain normal.
IMPRESSION: 1.  Submaximal inspiration with lesser bibasilar linear atelectasis or scarring. 
 2.  Otherwise no active disease. 
 DIAGNOSTIC RIGHT RIBS ? 2 VIEW:
FINDINGS: The heart size and mediastinal contours are within normal limits.  Both lungs are clear.  The visualized skeletal structures are unremarkable.  Region of maximal symptoms marked with surface BB.
IMPRESSION: Negative.

## 2008-05-17 IMAGING — CR DG CHEST 2V
3 series · 3 of 3 positions shown · non-contrast
Comparison: [REDACTED] chest x-ray 12/23/04.
COMPARISON: Chest x-ray 11/11/06.

CLINICAL DATA: Right pleuritic chest pain.  No trauma. 
 DIAGNOSTIC CHEST - 2 VIEW:

[w chest pa (1 of 2)]
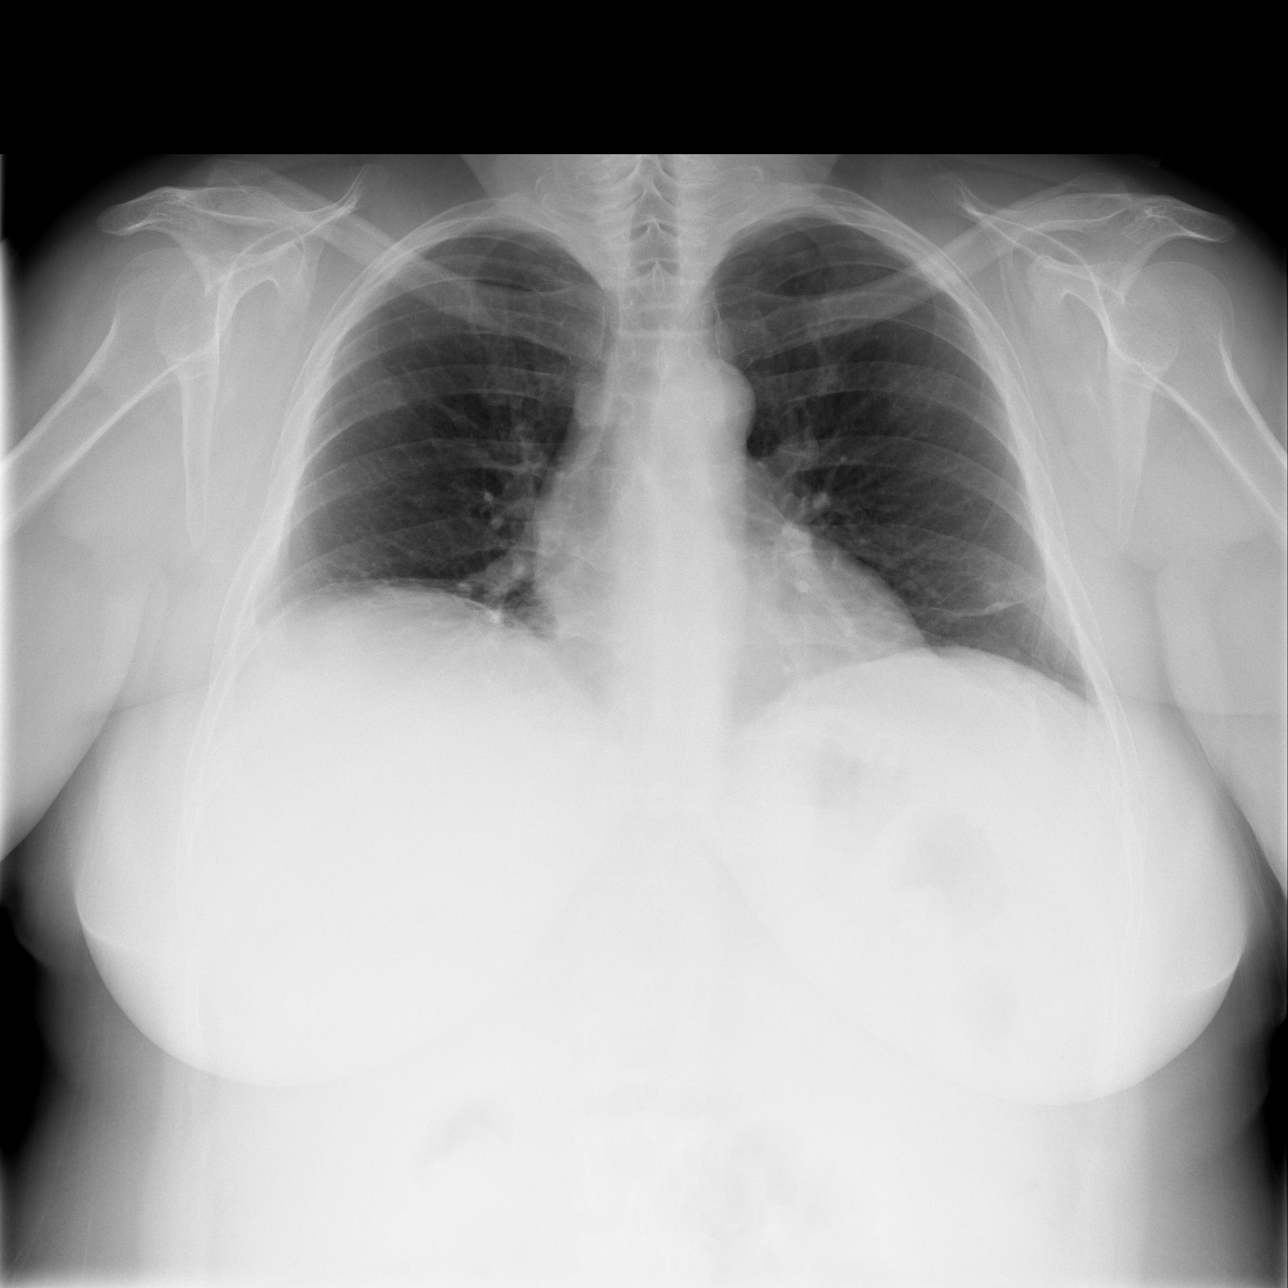

[w chest pa (2 of 2)]
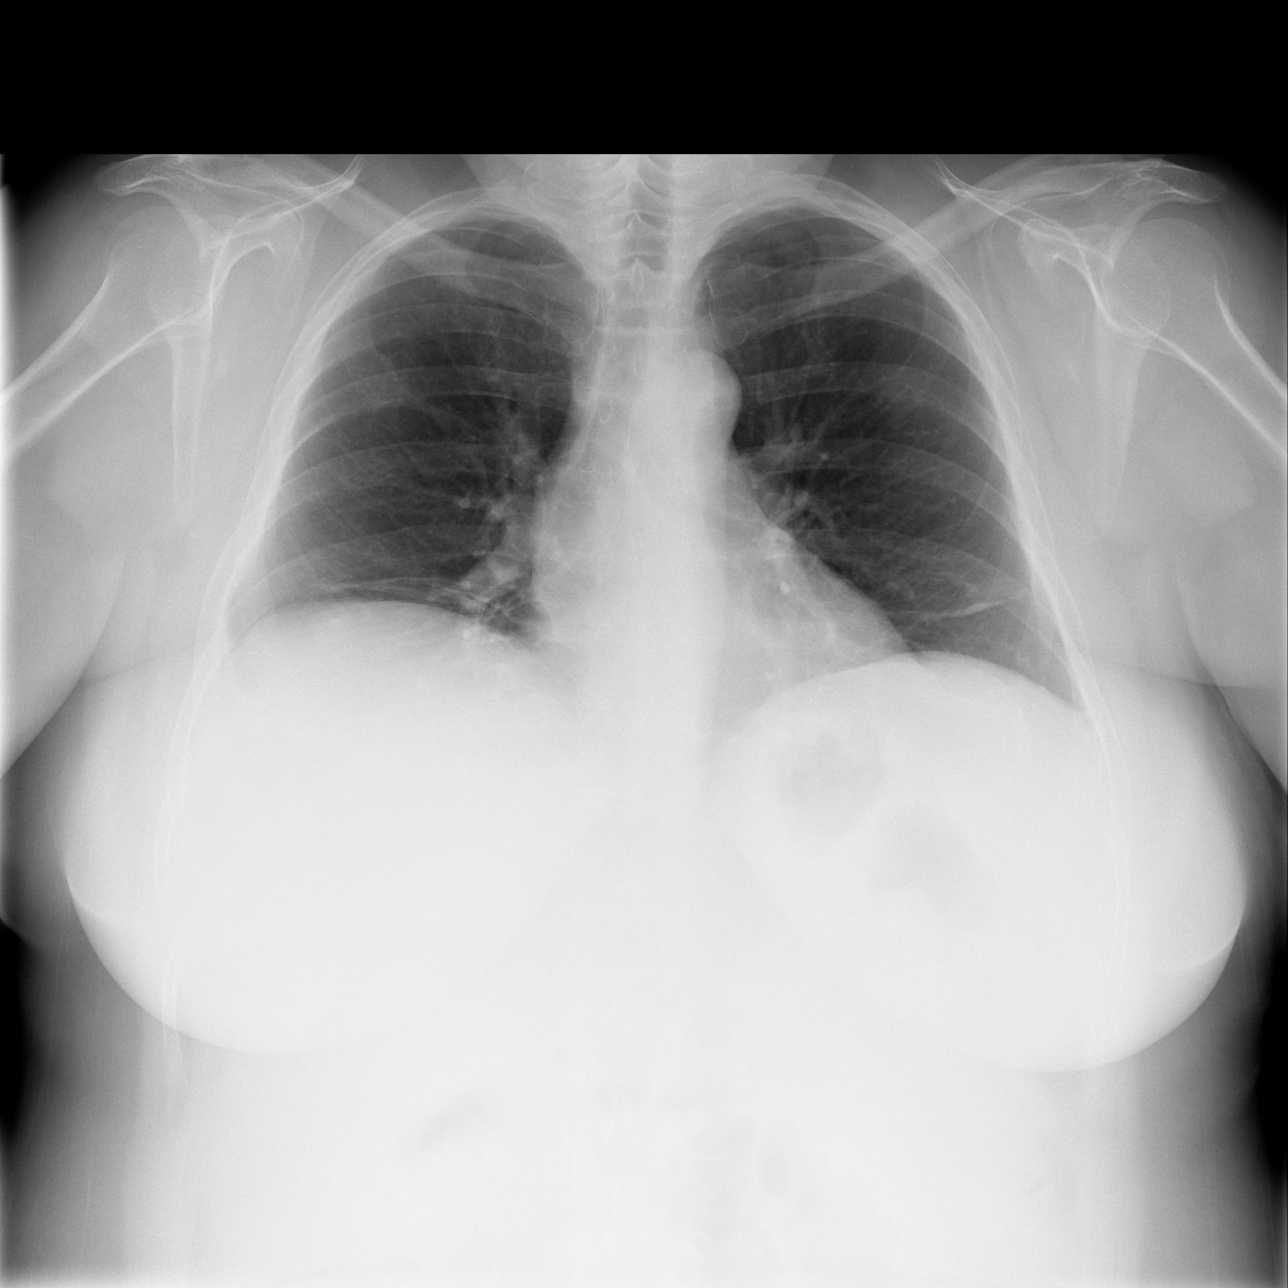

[w chest lat]
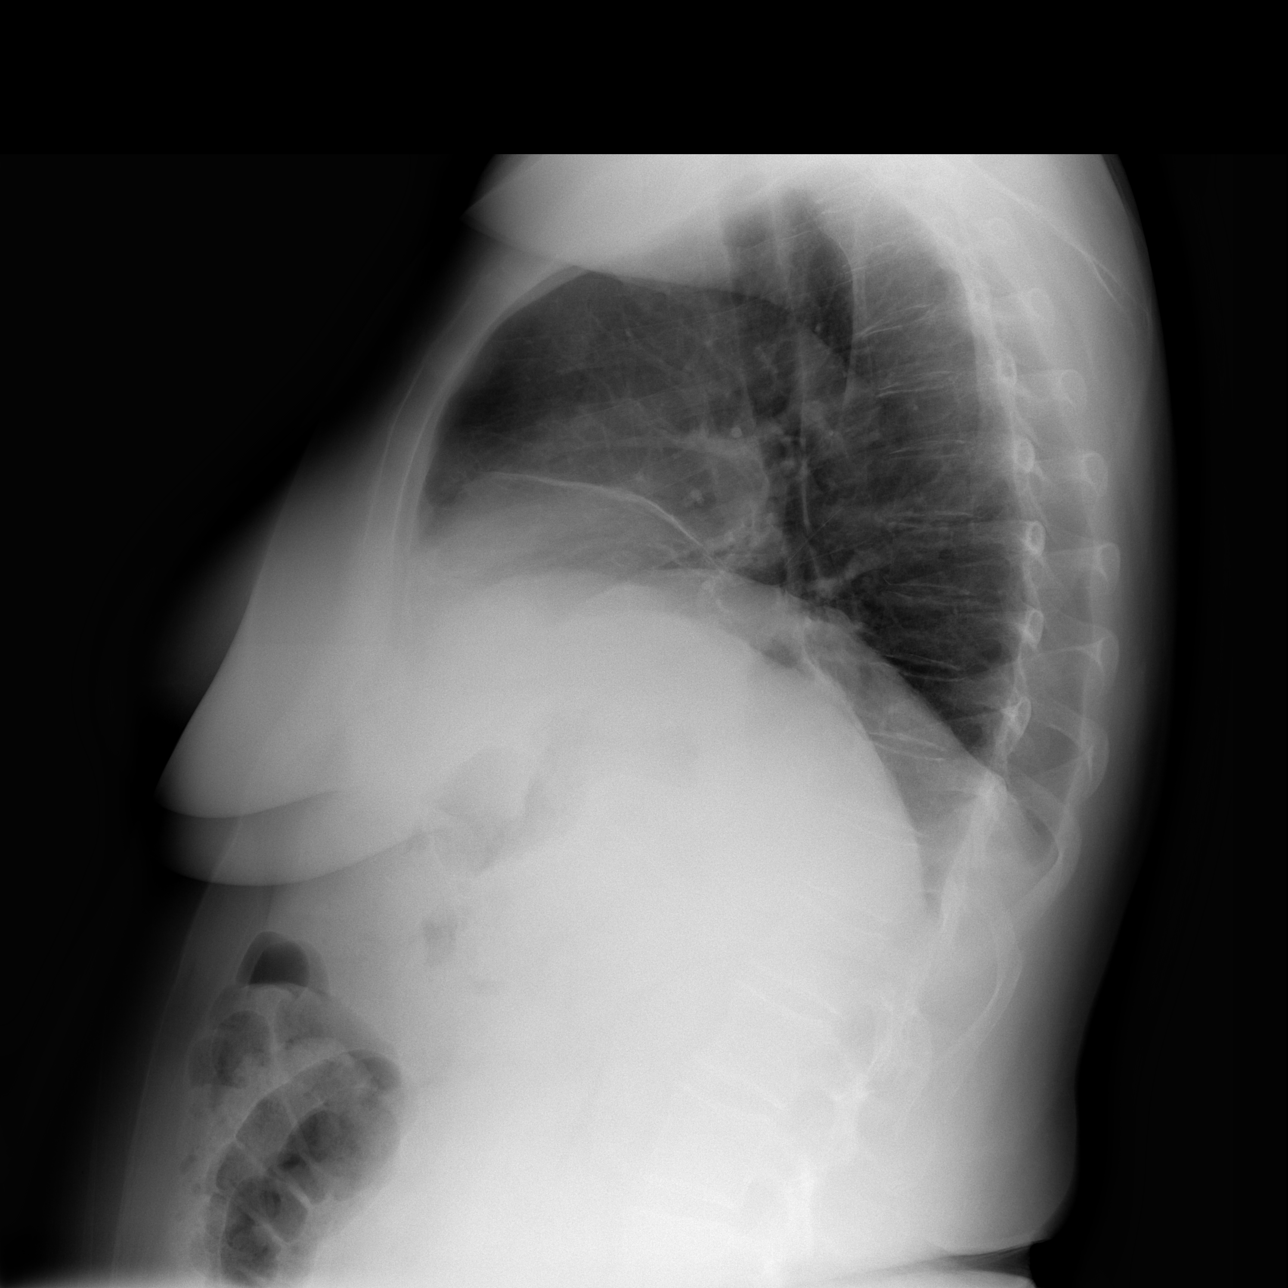

[3 of 3 positions shown; findings below may reference images not displayed]

FINDINGS: Lesser yet persistent linear atelectasis or scarring is seen at the bilateral lung bases with submaximal inspiration and the lungs otherwise clear.  No pneumothorax is seen.  Heart size remains normal.  Mediastinum, hila, pleural remain normal.
IMPRESSION: 1.  Submaximal inspiration with lesser bibasilar linear atelectasis or scarring. 
 2.  Otherwise no active disease. 
 DIAGNOSTIC RIGHT RIBS ? 2 VIEW:
FINDINGS: The heart size and mediastinal contours are within normal limits.  Both lungs are clear.  The visualized skeletal structures are unremarkable.  Region of maximal symptoms marked with surface BB.
IMPRESSION: Negative.

## 2008-05-18 IMAGING — US US RENAL
1 series · 14 of 25 positions shown · non-contrast
Comparison: None.

CLINICAL DATA: Right flank pain. History of hemorrhagic cysts. Polycystic kidney disease. 
RENAL ULTRASOUND:

[Series 1: unknown · 0.33mm/px · 14 of 39 slices shown]
[im 1/39]
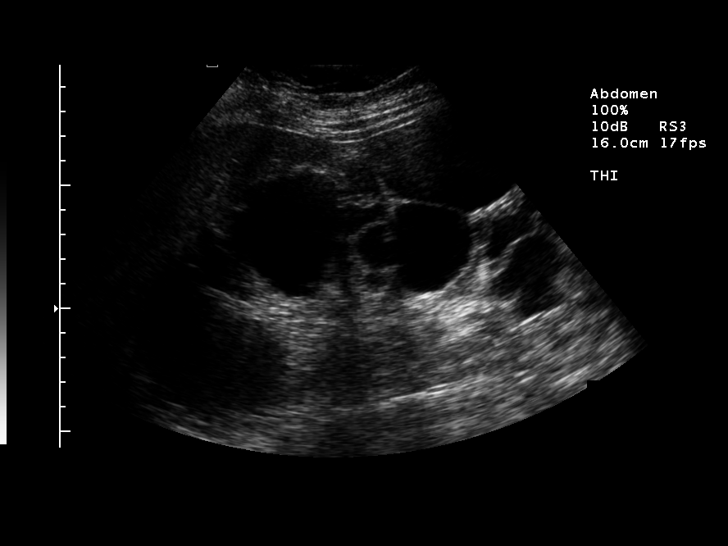
[im 4/39]
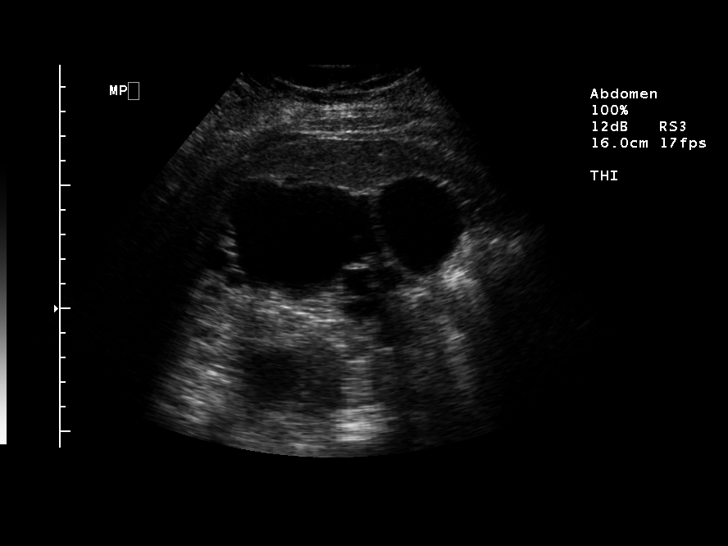
[im 7/39]
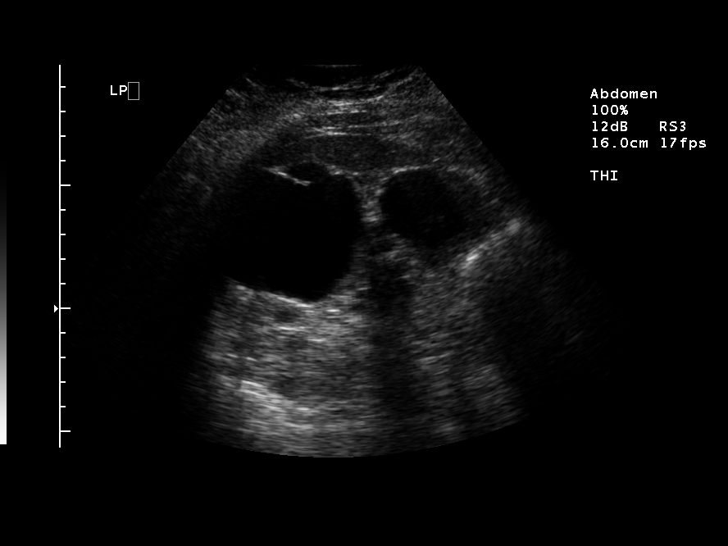
[im 10/39]
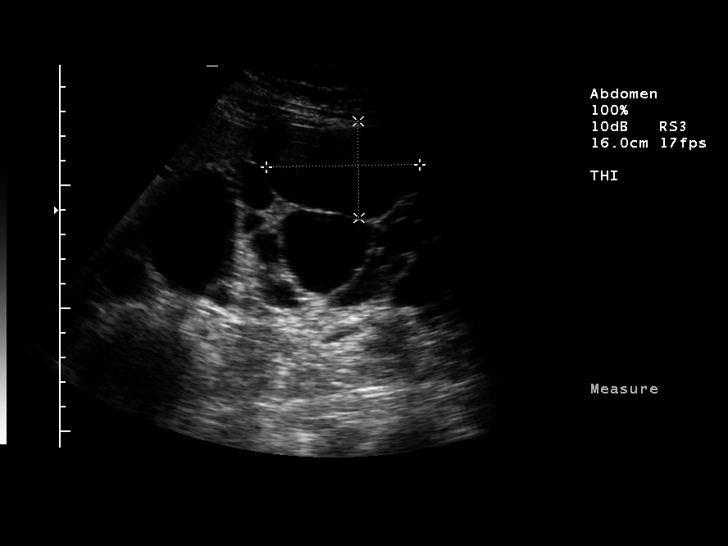
[im 13/39]
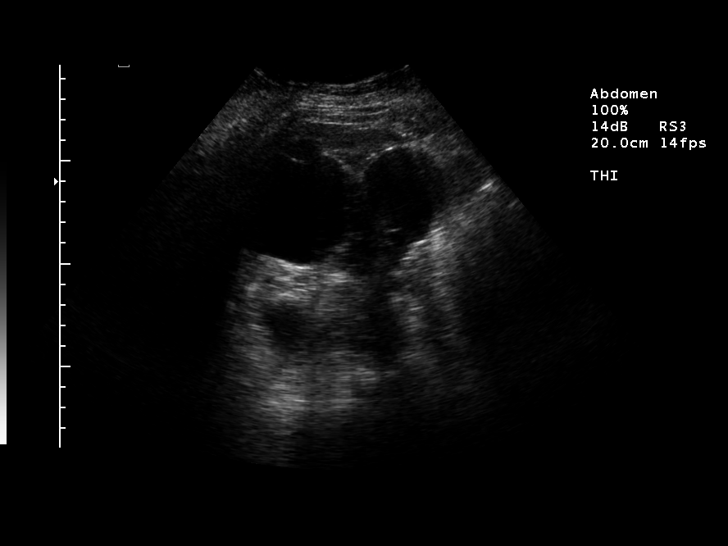
[im 15/39]
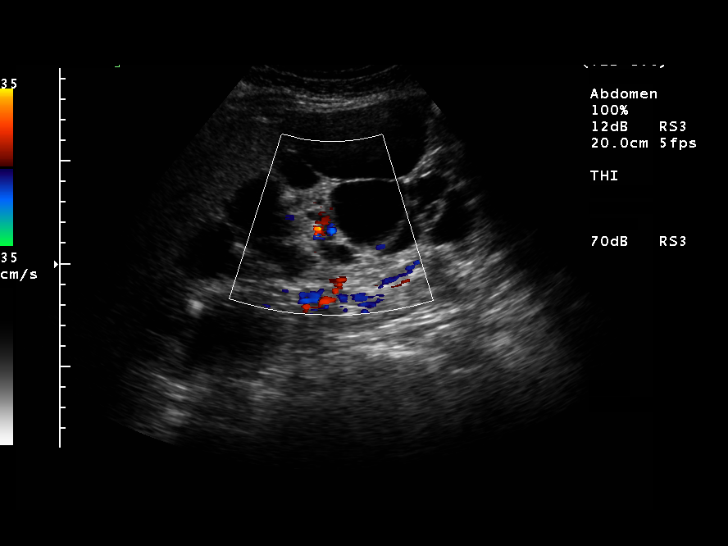
[im 18/39]
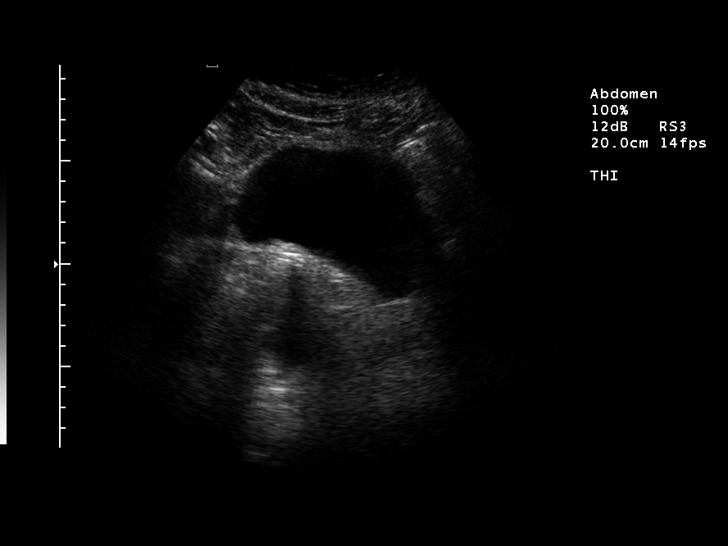
[im 21/39]
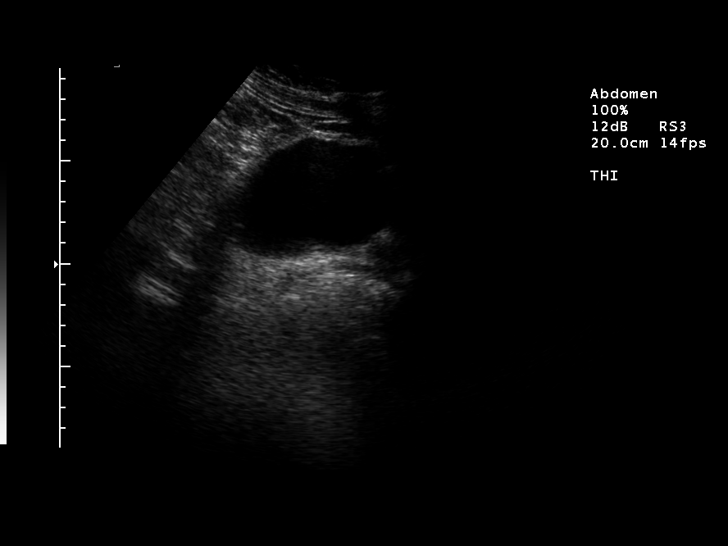
[im 24/39]
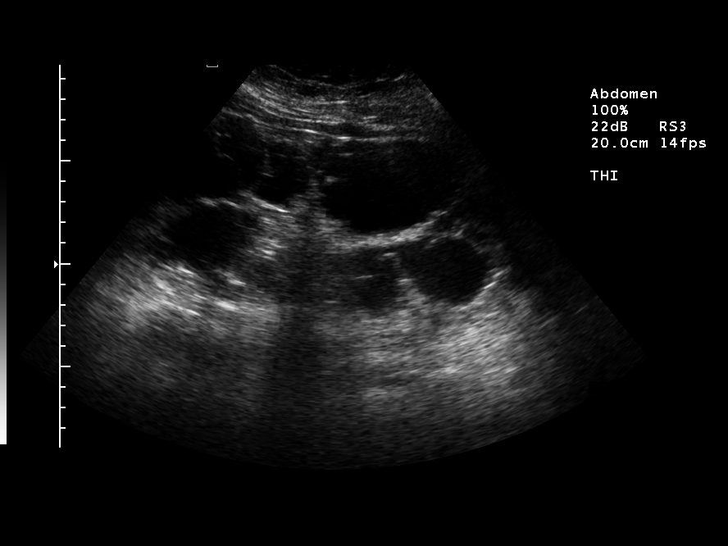
[im 26/39]
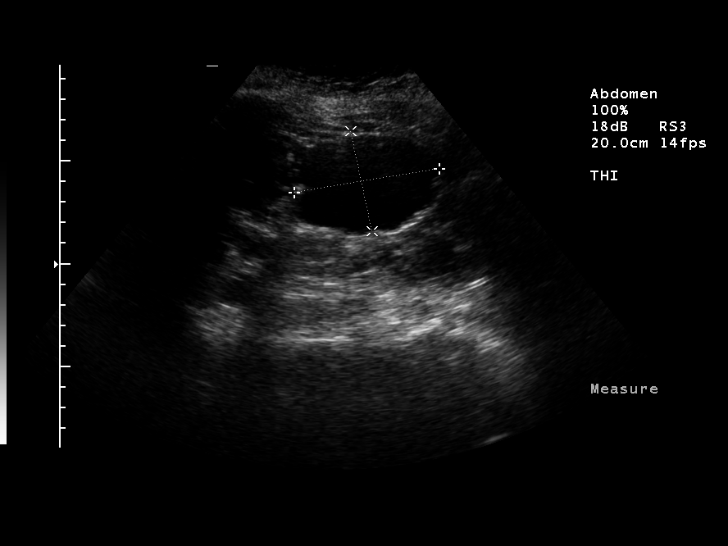
[im 29/39]
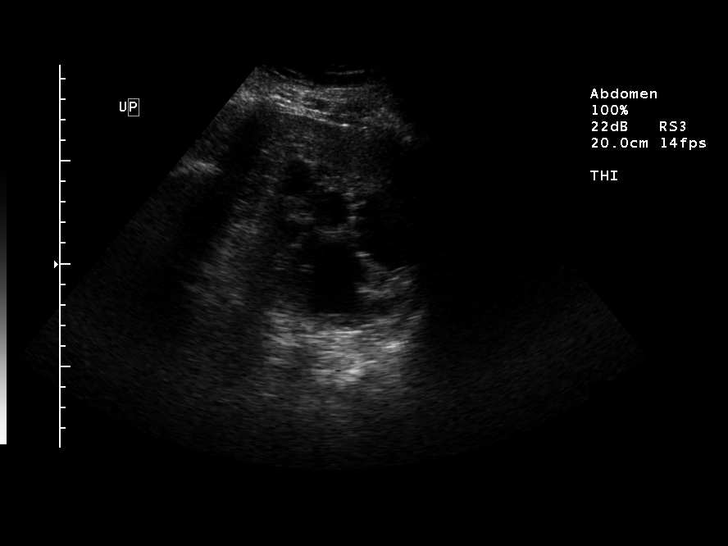
[im 32/39]
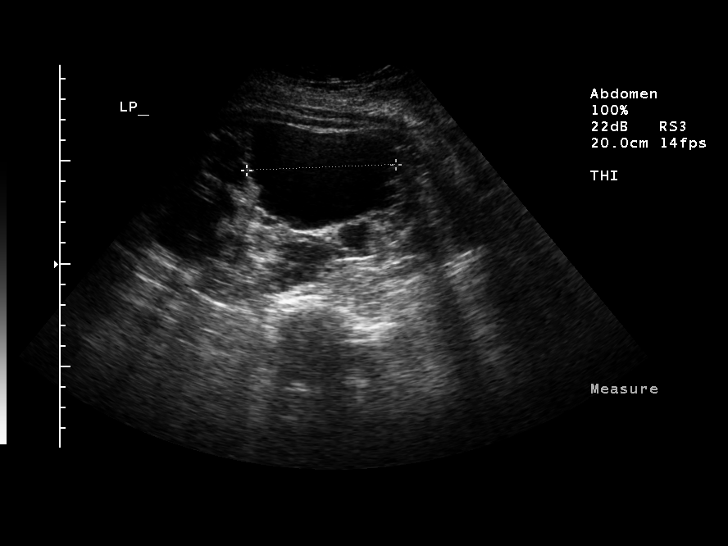
[im 35/39]
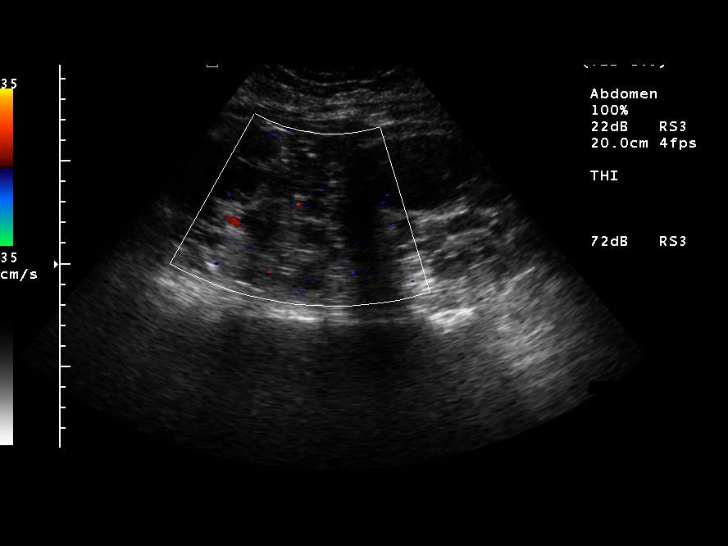
[im 39/39]
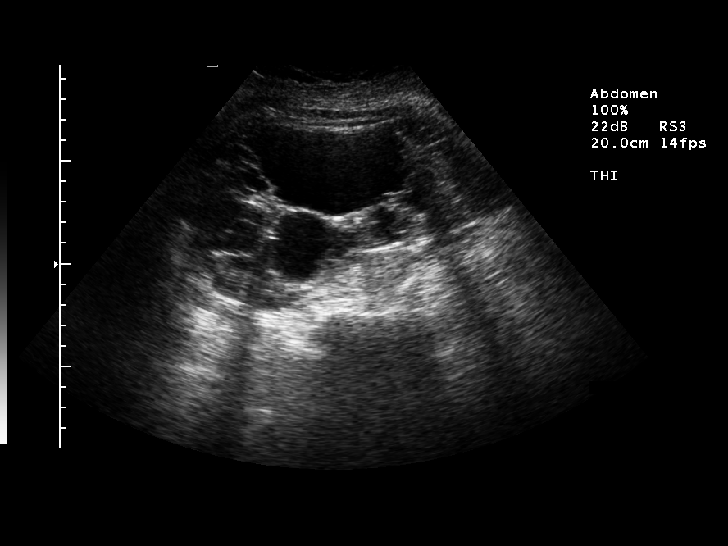

[14 of 25 positions shown; findings below may reference images not displayed]

FINDINGS: Right kidney 19.8 cm and left kidney 21.3 cm in length. There are multiple complex cysts within both kidneys, the largest on the right measures 6.2 x 3.9 x 6.2 cm, whereas the largest on the left measures 7.1 x 5.0 x 7.3 cm.  In this setting, malignancy cannot be completely excluded. If further delineation were clinically desired, MR imaging may be attempted. The bladder appears unremarkable.
IMPRESSION: Polycystic kidney disease with multiple cysts throughout both kidneys, several of which appear somewhat complex. If it would change the patient?s course of therapy and further evaluation of these renal structures is clinically desired, MR imaging (without gadolinium if there is elevated GFR) may be considered. Presently no definate evidence of hemorrhage within cystic lesions.

## 2008-06-27 IMAGING — CR DG FOOT COMPLETE 3+V*L*
3 series · 3 of 3 positions shown · non-contrast
Comparison: none

CLINICAL DATA: Pain left foot.
 LEFT FOOT - 3 VIEW:

[view not recorded (1 of 3)]
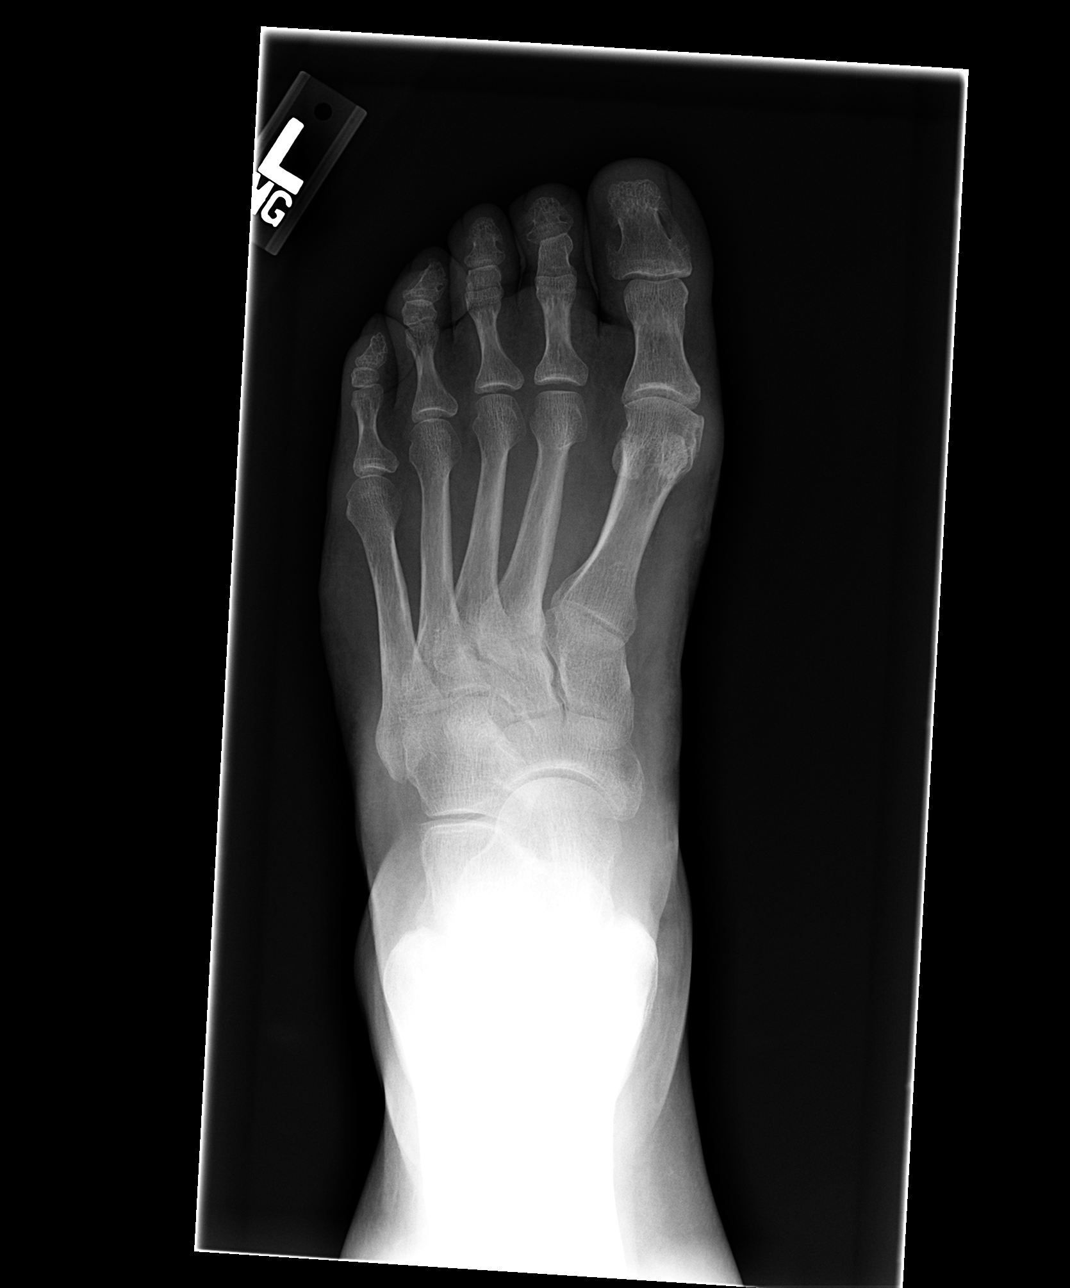

[view not recorded (2 of 3)]
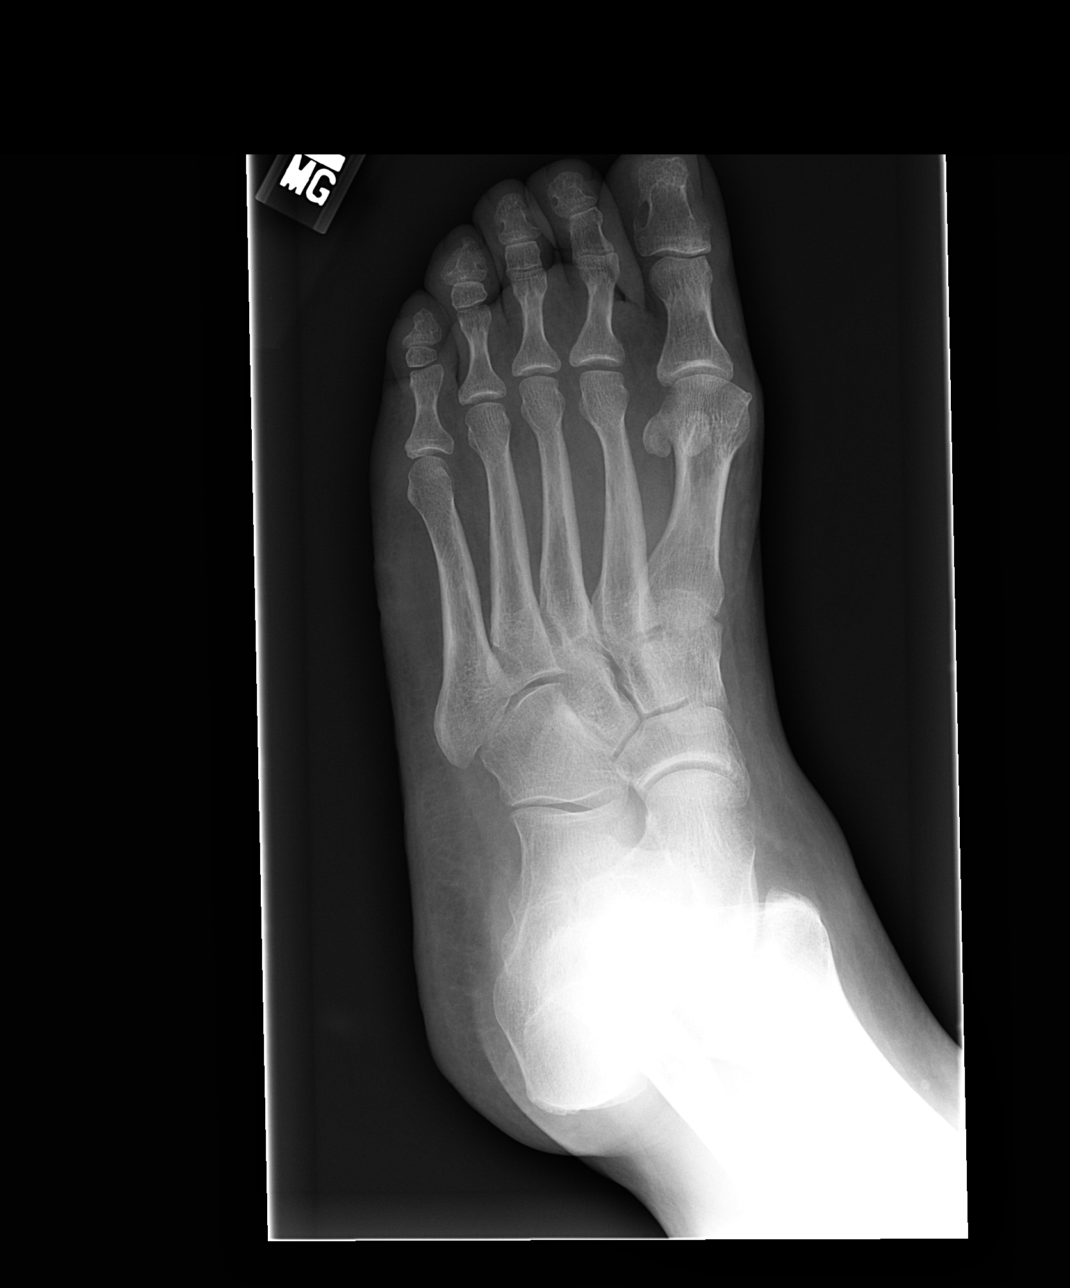

[view not recorded (3 of 3)]
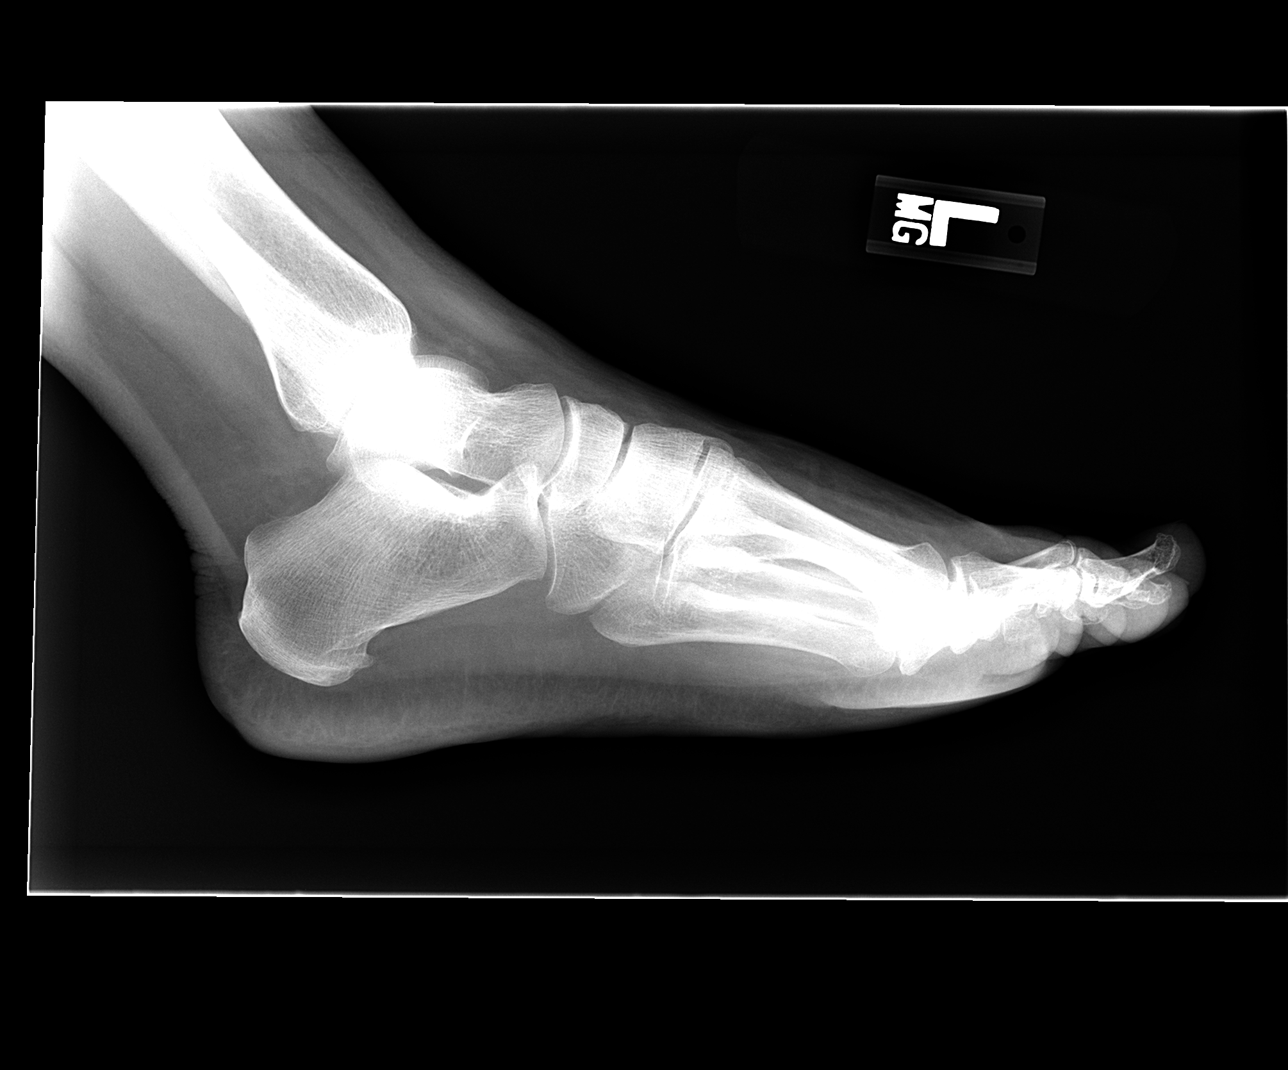

[3 of 3 positions shown; findings below may reference images not displayed]

FINDINGS: Three views of the left foot were obtained.  No acute bony abnormality is seen.  No periosteal reaction is noted.  If stress fracture is a concern, then a limited three phase bone scan of the feet and ankles is recommended.  A small calcaneal degenerative spur is noted on the plantar aspect.
IMPRESSION: No acute abnormality.  Consider a three phase bone scan of the feet and ankles if further assessment is warranted clinically.

## 2008-11-23 HISTORY — PX: TUBAL LIGATION: SHX77

## 2008-12-28 ENCOUNTER — Other Ambulatory Visit: Admission: RE | Admit: 2008-12-28 | Discharge: 2008-12-28 | Payer: Self-pay | Admitting: Family Medicine

## 2009-02-22 ENCOUNTER — Ambulatory Visit (HOSPITAL_COMMUNITY): Admission: RE | Admit: 2009-02-22 | Discharge: 2009-02-22 | Payer: Self-pay | Admitting: Gastroenterology

## 2009-03-27 ENCOUNTER — Encounter: Admission: RE | Admit: 2009-03-27 | Discharge: 2009-03-27 | Payer: Self-pay | Admitting: Gastroenterology

## 2009-04-23 ENCOUNTER — Ambulatory Visit (HOSPITAL_COMMUNITY): Admission: RE | Admit: 2009-04-23 | Discharge: 2009-04-23 | Payer: Self-pay | Admitting: Family Medicine

## 2009-06-03 ENCOUNTER — Ambulatory Visit (HOSPITAL_COMMUNITY): Admission: RE | Admit: 2009-06-03 | Discharge: 2009-06-03 | Payer: Self-pay | Admitting: Family Medicine

## 2009-08-01 ENCOUNTER — Ambulatory Visit (HOSPITAL_COMMUNITY): Admission: RE | Admit: 2009-08-01 | Discharge: 2009-08-01 | Payer: Self-pay | Admitting: Family Medicine

## 2009-08-16 ENCOUNTER — Ambulatory Visit (HOSPITAL_COMMUNITY): Admission: RE | Admit: 2009-08-16 | Discharge: 2009-08-16 | Payer: Self-pay | Admitting: Nephrology

## 2009-08-30 ENCOUNTER — Ambulatory Visit (HOSPITAL_COMMUNITY): Admission: RE | Admit: 2009-08-30 | Discharge: 2009-08-30 | Payer: Self-pay | Admitting: Nephrology

## 2010-01-29 ENCOUNTER — Ambulatory Visit: Payer: Self-pay | Admitting: Cardiology

## 2010-01-29 ENCOUNTER — Observation Stay (HOSPITAL_COMMUNITY): Admission: EM | Admit: 2010-01-29 | Discharge: 2010-01-31 | Payer: Self-pay | Admitting: Emergency Medicine

## 2010-01-31 ENCOUNTER — Encounter (INDEPENDENT_AMBULATORY_CARE_PROVIDER_SITE_OTHER): Payer: Self-pay | Admitting: Internal Medicine

## 2010-01-31 ENCOUNTER — Encounter: Payer: Self-pay | Admitting: Cardiology

## 2010-06-13 ENCOUNTER — Ambulatory Visit (HOSPITAL_COMMUNITY): Admission: RE | Admit: 2010-06-13 | Discharge: 2010-06-13 | Payer: Self-pay | Admitting: Nephrology

## 2010-08-29 IMAGING — NM NM HEPATO W/GB/PHARM/[PERSON_NAME]
2 series · 12 of 12 positions shown · non-contrast
Comparison: Ultrasound dated 02/22/2009

CLINICAL DATA: Epigastric pain.

NUCLEAR MEDICINE HEPATOBILIARY IMAGING WITH GALLBLADDER EF
TECHNIQUE: Sequential images of the abdomen were obtained [DATE]
minutes following intravenous administration of
radiopharmaceutical.  After slow intravenous infusion of 1.9 uCg
Cholecystokinin, gallbladder ejection fraction was determined.
Radiopharmaceutical:  5.2 mCi Rc-HHm Choletec

[Series 1: he hepato · 4.71mm/px · 6 of 30 frames shown (1 of 2)]
[frame 3/30]
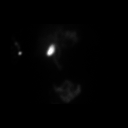
[frame 8/30]
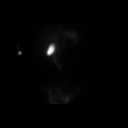
[frame 13/30]
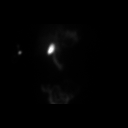
[frame 18/30]
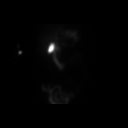
[frame 23/30]
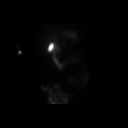
[frame 28/30]
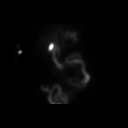

[Series 1: he hepato · 4.71mm/px · 6 of 60 frames shown (2 of 2)]
[frame 6/60]
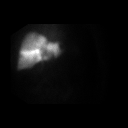
[frame 16/60]
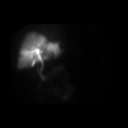
[frame 26/60]
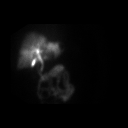
[frame 36/60]
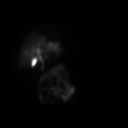
[frame 46/60]
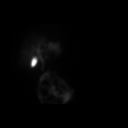
[frame 56/60]
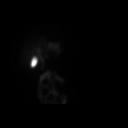

[12 of 12 positions shown; findings below may reference images not displayed]

FINDINGS: There was immediate uptake and excretion of the agent
into the biliary tree at 5 minutes.  The gallbladder was visualized
at 10 minutes.  Duodenum was also visualized at 10 minutes.  The
gallbladder continued to fill.  After CCK administration an
ejection fraction was calculated.  The ejection fraction was 63.9%
at 28 minutes.

The patient did not experience symptoms during CCK infusion.
IMPRESSION: Normal hepatobiliary scan with normal ejection fraction.

## 2010-08-29 IMAGING — US US ABDOMEN COMPLETE
1 series · 14 of 25 positions shown · non-contrast
Comparison: 10/23/2006

CLINICAL DATA: Epigastric pain

COMPLETE ABDOMINAL ULTRASOUND

[Series 1: us abdomen complete · 0.30mm/px · 14 of 82 slices shown]
[im 1/82]
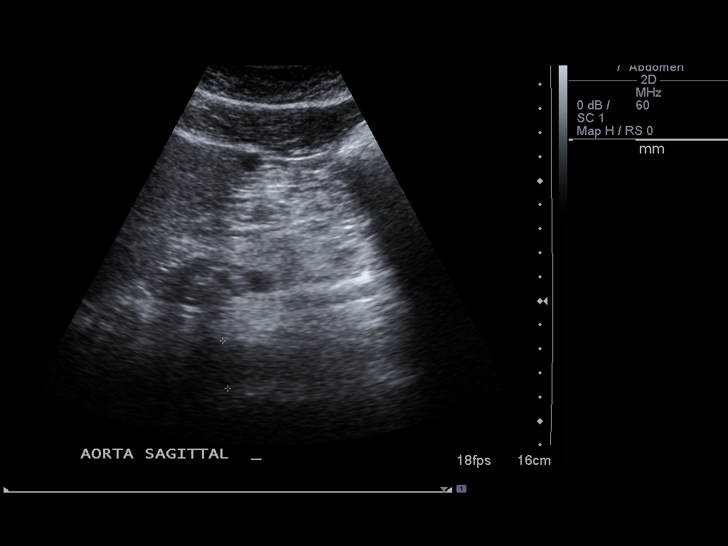
[im 7/82]
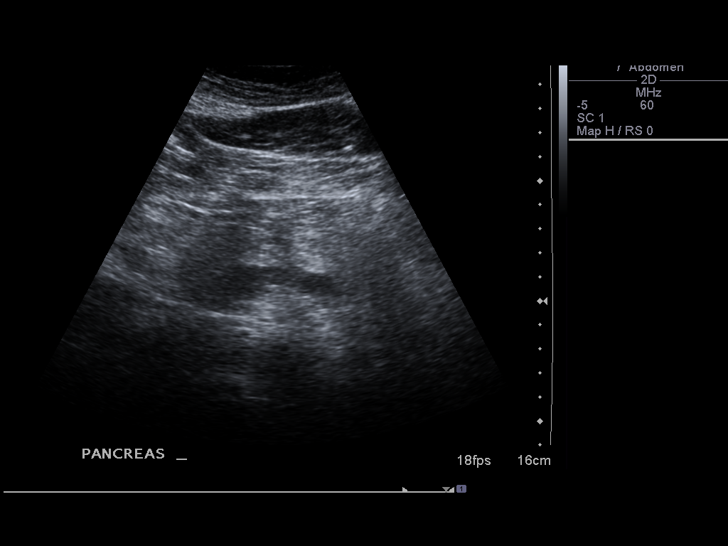
[im 14/82]
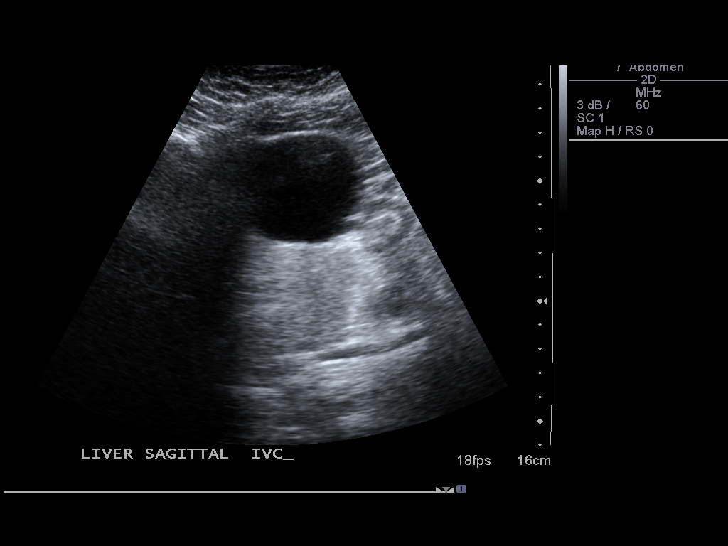
[im 21/82]
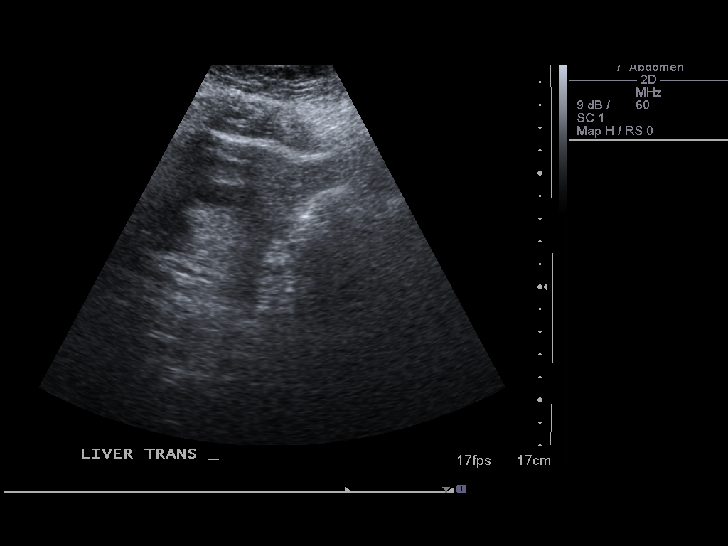
[im 28/82]
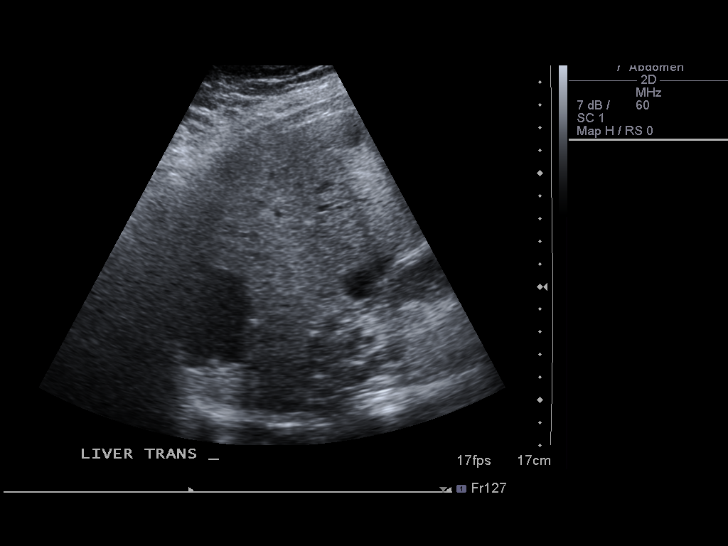
[im 31/82]
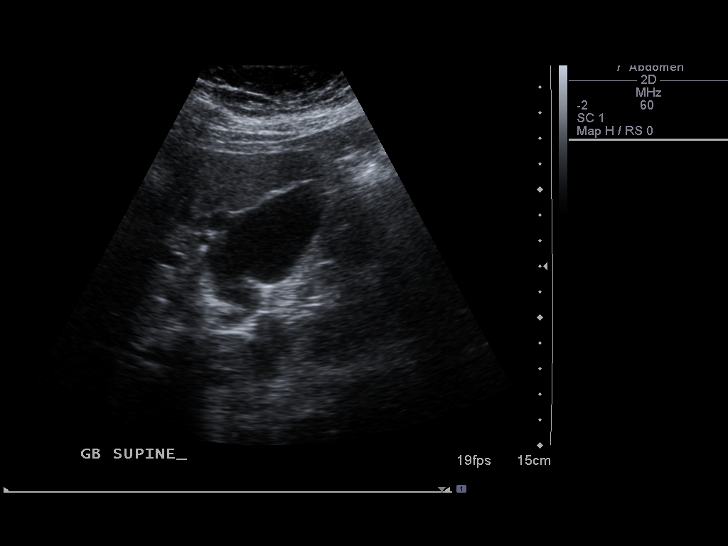
[im 38/82]
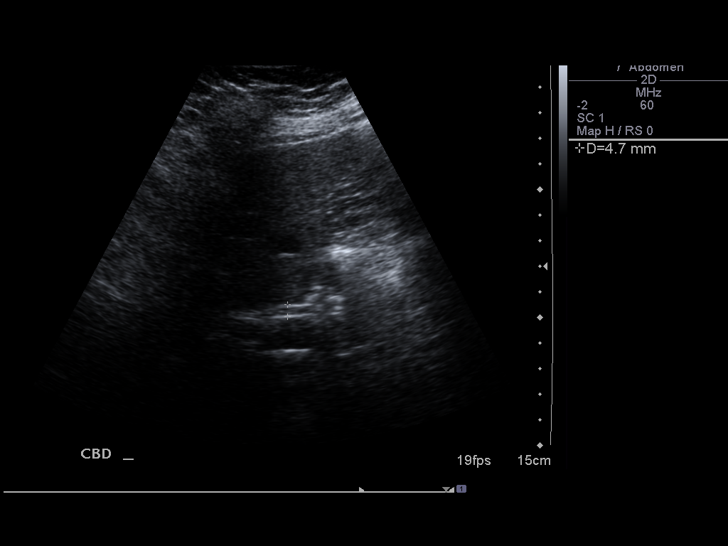
[im 44/82]
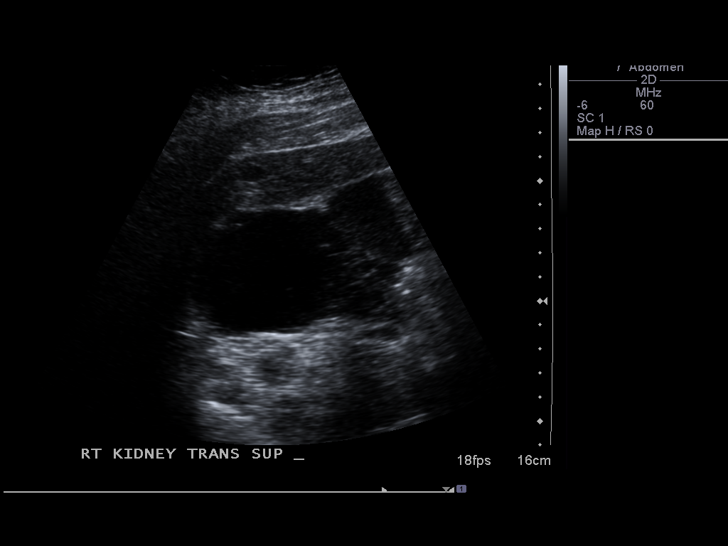
[im 51/82]
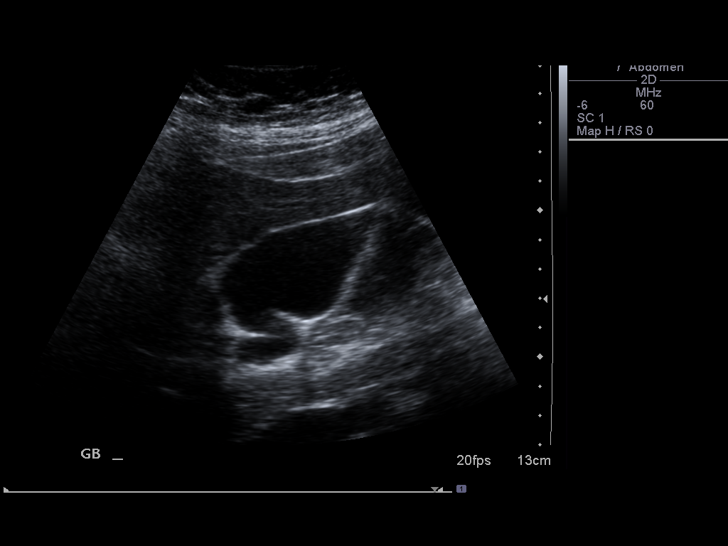
[im 55/82]
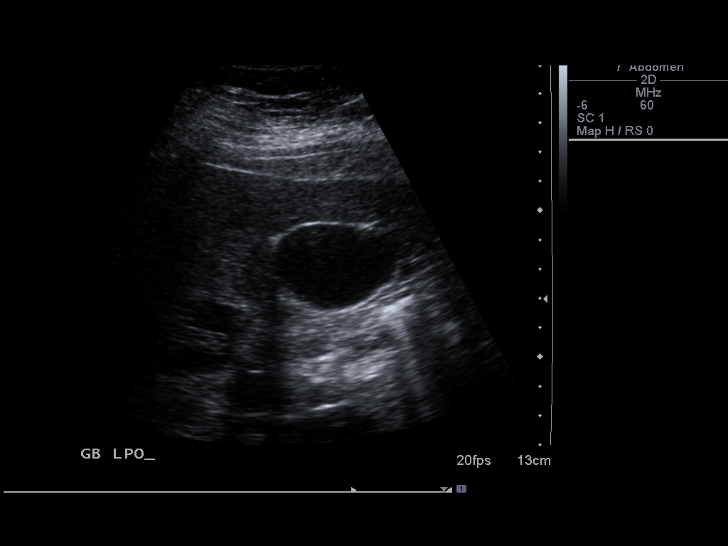
[im 61/82]
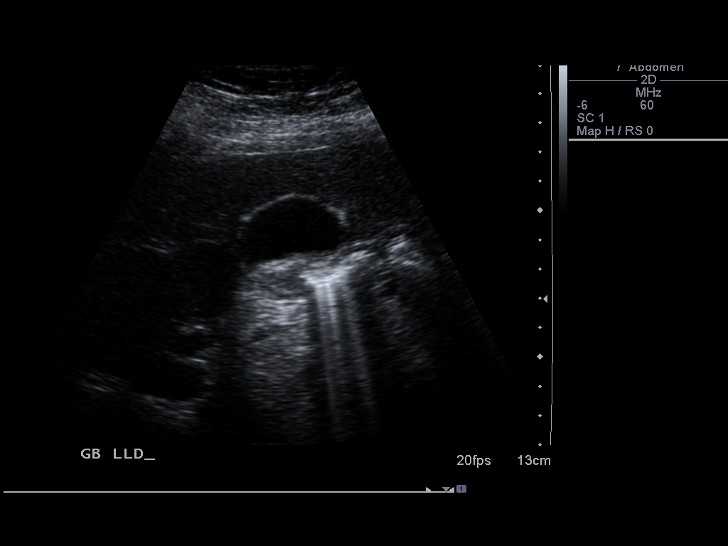
[im 68/82]
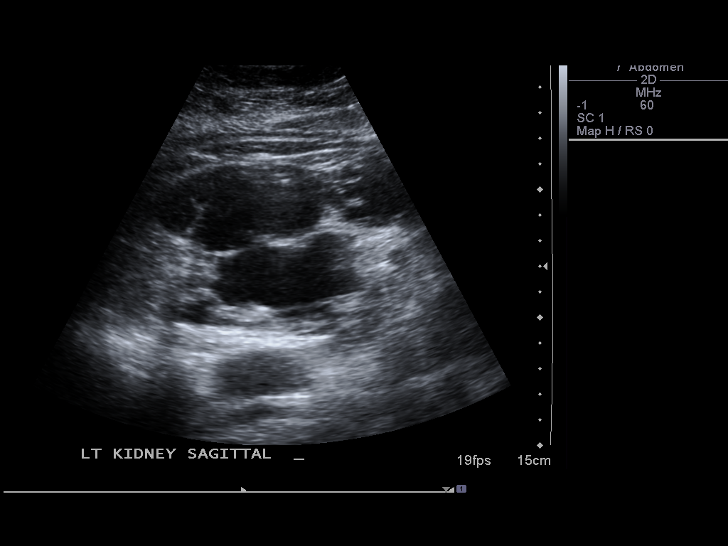
[im 75/82]
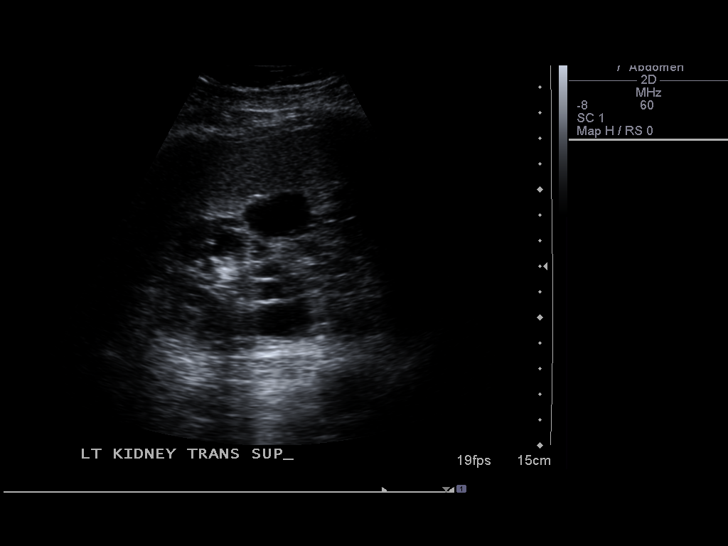
[im 82/82]
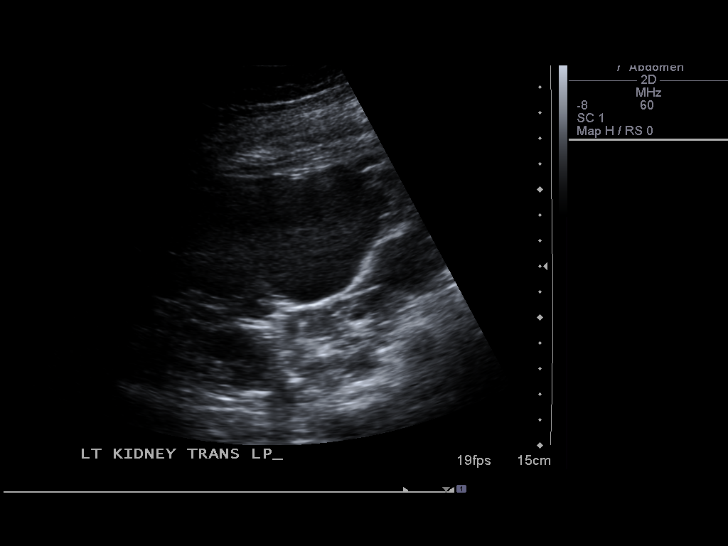

[14 of 25 positions shown; findings below may reference images not displayed]

FINDINGS: Gallbladder:  Physiologically distended without stones, wall
thickening, or pericholecystic fluid.  Sonographer reports no
sonographic Murphy's sign.

Common bile duct:  Normal in caliber, 4.7 mm diameter.

Liver:  Homogeneous in echotexture without   intrahepatic bile duct
dilatation. There is a 4.8 cm simple appearing cyst in the
posterior right hepatic segment.  There is a 5 cm simple appearing
cyst in the left hepatic lobe.

IVC:  Negative

Pancreas:  Negative

Spleen:  No focal lesion, 8.9cm in length.

Right Kidney:  Enlarged with multiple cysts, 16cm in length.

Left Kidney:  Enlarged with multiple cysts, 17cm in length.

Abdominal aorta:  Negative
IMPRESSION: 1.  Normal gallbladder.
2.  Multiple cysts enlarging bilateral kidneys consistent with
polycystic kidney disease.
2.  Two large simple appearing hepatic cysts.

## 2010-10-01 IMAGING — CT CT PELVIS W/O CM
3 of 4 series · 13 of 36 positions shown, 19 images · non-contrast
Comparison: Ultrasound abdomen 02/22/2009

CT ABDOMEN

CLINICAL DATA: Extrinsic compression of the stomach seen on recent
endoscopy.  Polycystic kidney disease.  Nausea and constipation.

CT ABDOMEN AND PELVIS WITHOUT CONTRAST
TECHNIQUE: Multidetector CT imaging of the abdomen and pelvis was
performed following the standard protocol without intravenous
contrast.

[Series 3: unenhanced · axial · 0.86mm/px · z∈[-384,-29]mm · 8 of 93 slices shown, 13 images]
[im 11/93  soft-tissue]
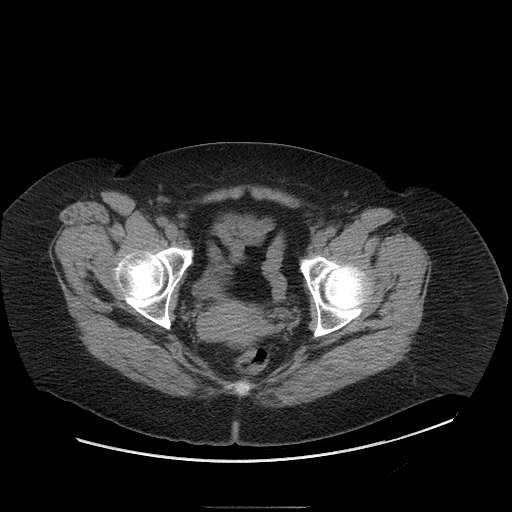
[im 11/93  bone]
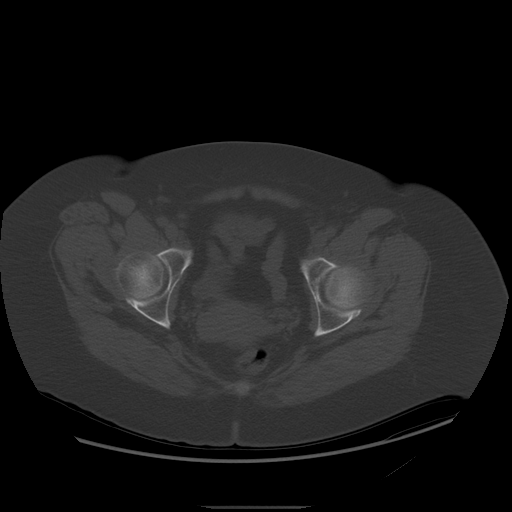
[im 21/93  soft-tissue]
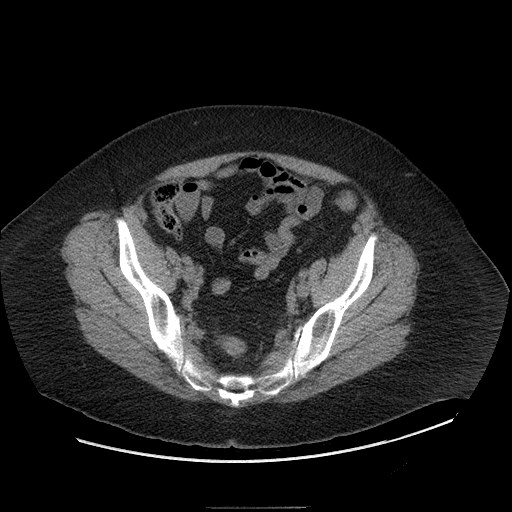
[im 31/93  soft-tissue]
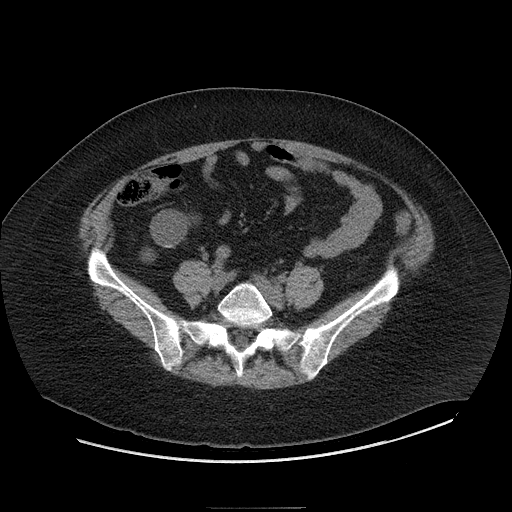
[im 41/93  soft-tissue]
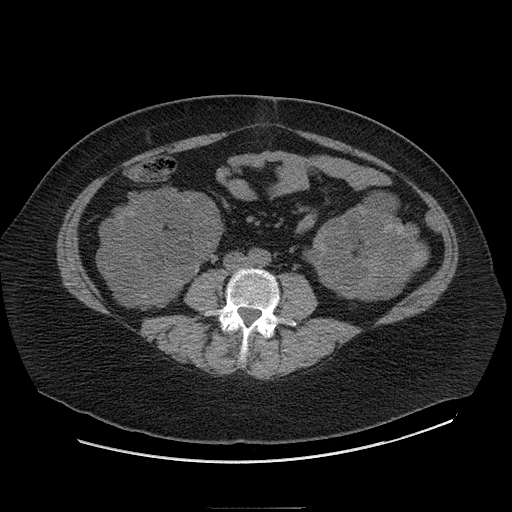
[im 52/93  soft-tissue]
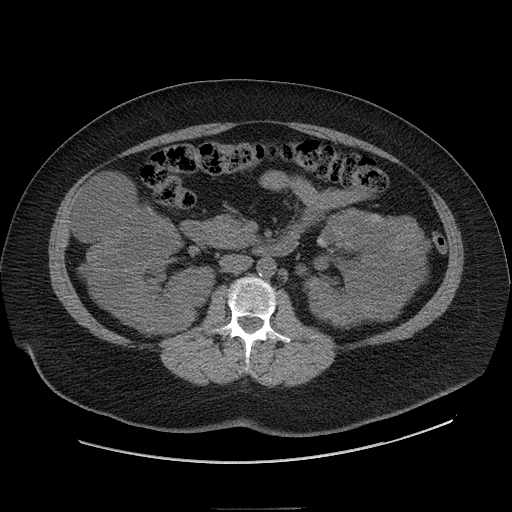
[im 52/93  lung]
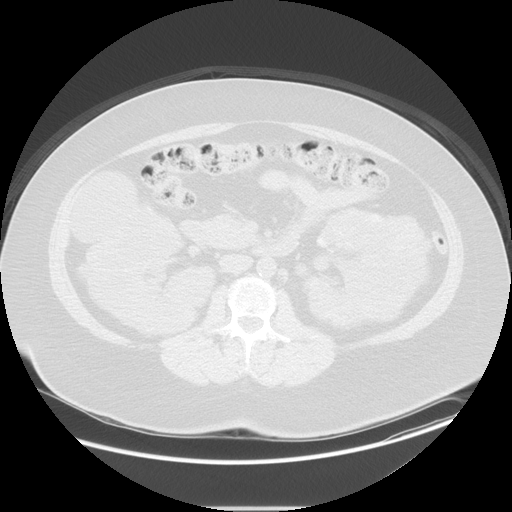
[im 62/93  soft-tissue]
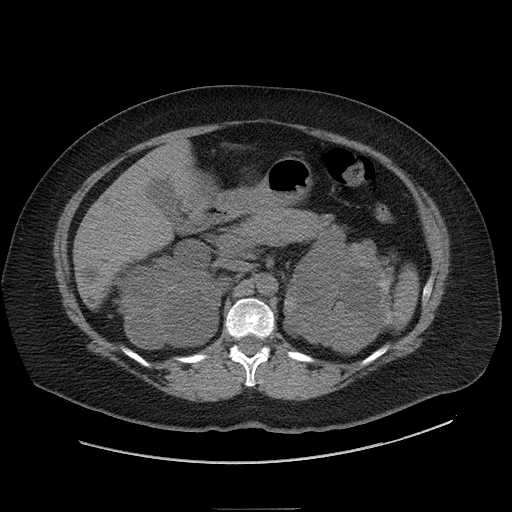
[im 62/93  lung]
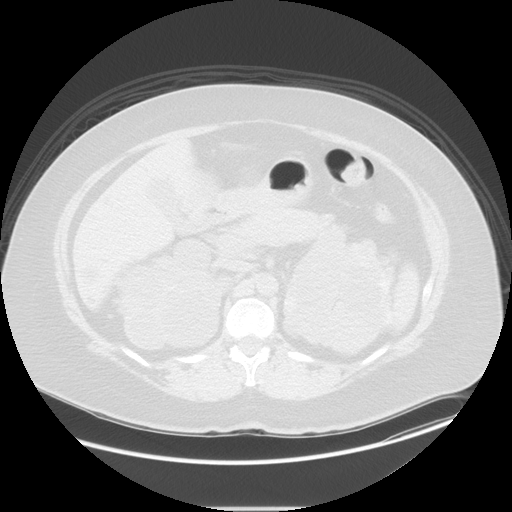
[im 72/93  soft-tissue]
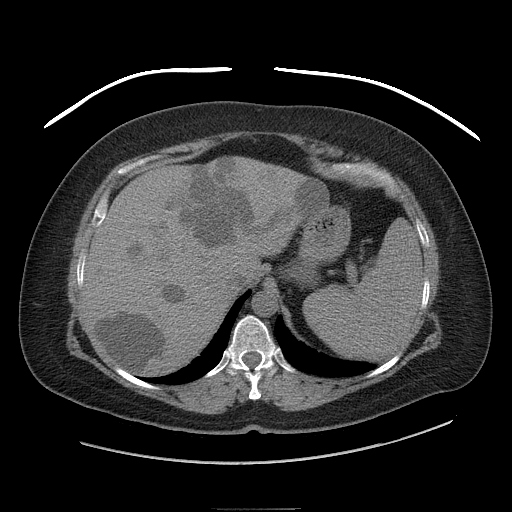
[im 72/93  lung]
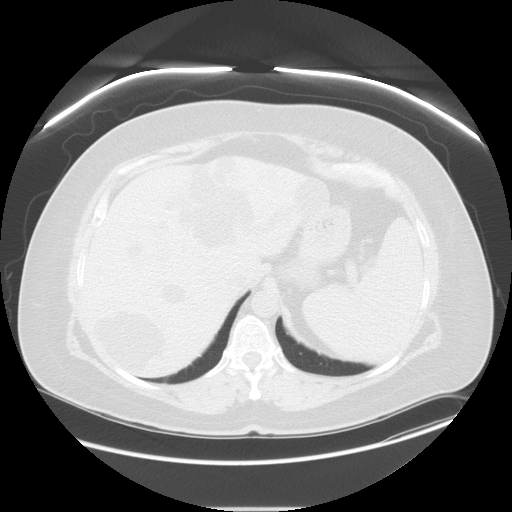
[im 82/93  soft-tissue]
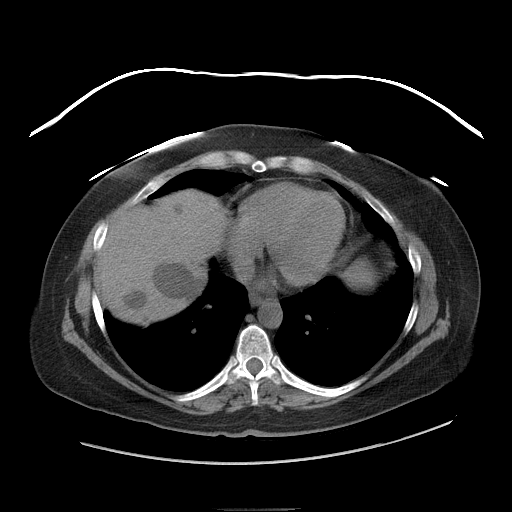
[im 82/93  lung]
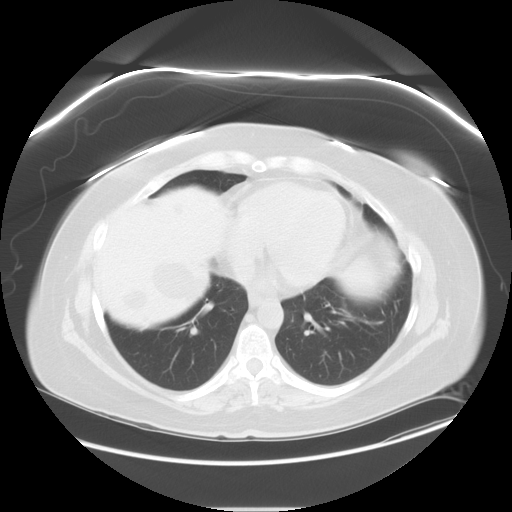

[Series 601: coronal body · coronal · 0.99mm/px · 1 of 130 slices shown, 2 images]
[im 44/130  soft-tissue]
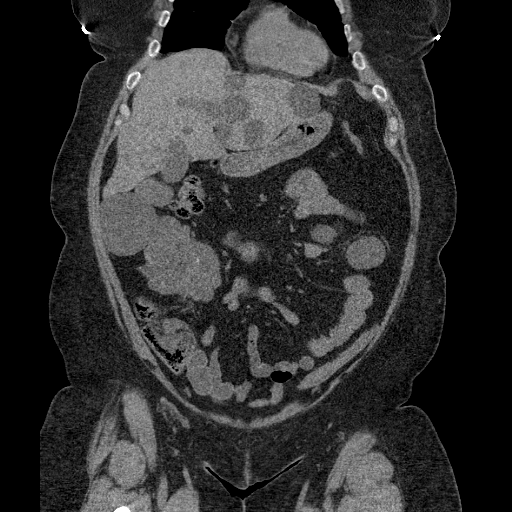
[im 44/130  bone]
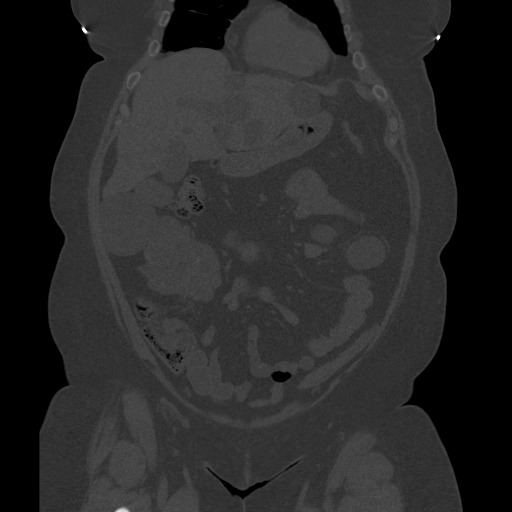

[Series 602: sagittal body · sagittal · 0.99mm/px · 4 of 177 slices shown]
[im 20/177  soft-tissue]
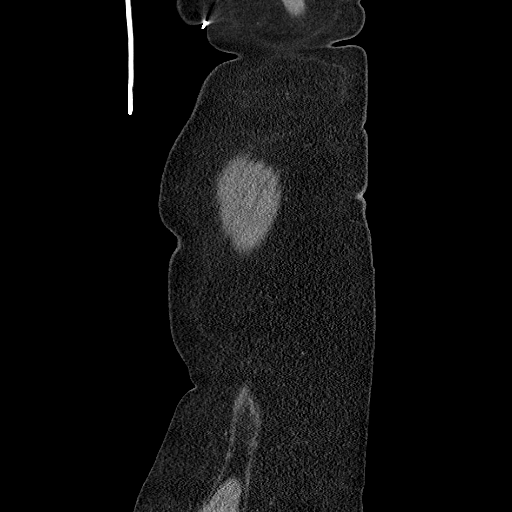
[im 40/177  soft-tissue]
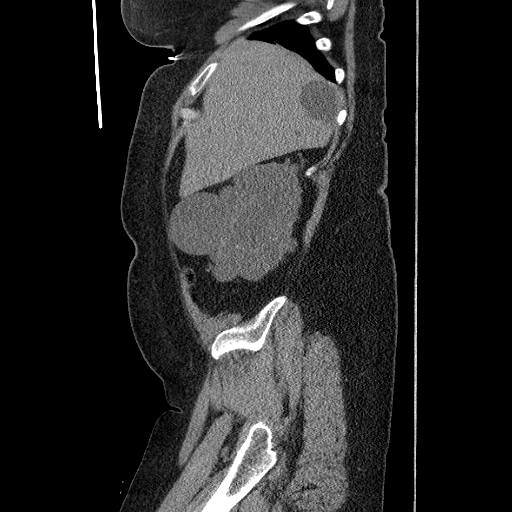
[im 59/177  soft-tissue]
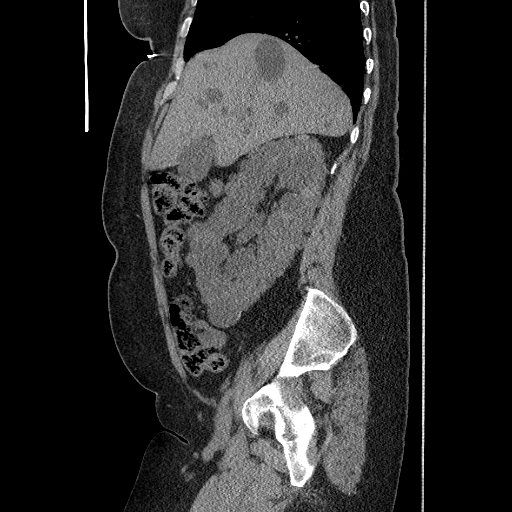
[im 79/177  soft-tissue]
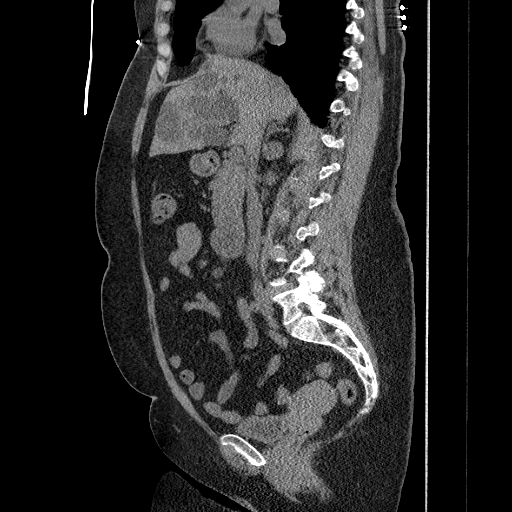

[13 of 36 positions shown; findings below may reference images not displayed]

FINDINGS: Lung bases show a 10 x 13 mm nodule in the left lower
lobe, which is incompletely imaged.  Scattered scarring is seen as
well.  Heart size normal.  No pericardial or pleural effusion.

There are multiple low attenuation lesions in the liver, measuring
up to 4.4 x 6.3 cm in the right hepatic lobe.  Gallbladder and
adrenal glands unremarkable.  Renal parenchyma is replaced by a
cystic lesions of varying density and scattered calcification.
Spleen, pancreas, stomach and small bowel are unremarkable.  Small
periumbilical hernia contains fat.  No pathologically enlarged
lymph nodes.  No free fluid.
IMPRESSION: 1.  Left lower lobe nodule is incompletely imaged.  Primary
bronchogenic carcinoma is considered.  Chest CT would be helpful in
further evaluation. This was discussed with [place], in Dr. [REDACTED], and [DATE] a.m. on 03/27/2009.
2.  No acute findings in the abdomen.
3.  Multiple cysts of varying complexity in the liver and kidneys,
consistent with given history of polycystic kidney disease.

CT PELVIS
FINDINGS: Colon, appendix and bladder are unremarkable.  Uterus
and ovaries are visualized.  No pathologically enlarged lymph
nodes.  No free fluid.  No worrisome lytic or sclerotic lesions. A
probable bone island is seen in the T8 vertebral body.
IMPRESSION: No acute findings in the pelvis.

## 2010-10-09 ENCOUNTER — Ambulatory Visit: Payer: Self-pay | Admitting: Vascular Surgery

## 2010-10-21 ENCOUNTER — Ambulatory Visit: Payer: Self-pay | Admitting: Vascular Surgery

## 2010-10-21 ENCOUNTER — Ambulatory Visit (HOSPITAL_COMMUNITY): Admission: RE | Admit: 2010-10-21 | Discharge: 2010-10-21 | Payer: Self-pay | Admitting: Vascular Surgery

## 2010-10-21 HISTORY — PX: AV FISTULA PLACEMENT: SHX1204

## 2010-10-28 IMAGING — CT NM PET TUM IMG INITIAL (PI) SKULL BASE T - THIGH
6 series · 25 of 25 positions shown · non-contrast
Comparison: Abdominal/pelvic CT dated 03/27/2009

CLINICAL DATA: Initial treatment strategy for left lower lobe lung
nodule.

NUCLEAR MEDICINE PET CT SKULL BASE TO THIGH
TECHNIQUE: 18.0 mCi F-18 FDG was injected intravenously via the
left AC.  Full-ring PET imaging was performed from the skull base
through the mid-thighs 71  minutes after injection.  CT data was
obtained and used for attenuation correction and anatomic
localization only.  (This was not acquired as a diagnostic CT
examination.)
Fasting Blood Glucose:  95

[Series 1: pet ac · axial · 3.3mm · 4.69mm/px · z∈[-870,+0]mm · 5 of 267 slices shown]
[im 1/267]
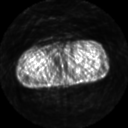
[im 67/267]
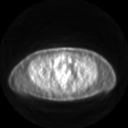
[im 134/267]
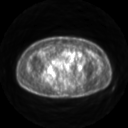
[im 200/267]
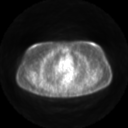
[im 267/267]
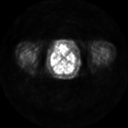

[Series 2: pet nac · axial · 3.3mm · 4.69mm/px · z∈[-870,+0]mm · 6 of 267 slices shown]
[im 1/267]
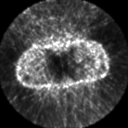
[im 54/267]
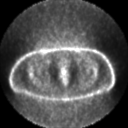
[im 107/267]
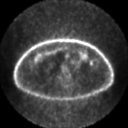
[im 160/267]
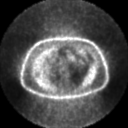
[im 213/267]
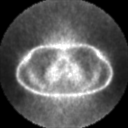
[im 267/267]
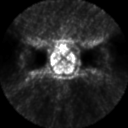

[Series 2: ct images · axial · 3.8mm · 0.98mm/px · z∈[-863,+0]mm · 6 of 257 slices shown]
[im 1/257]
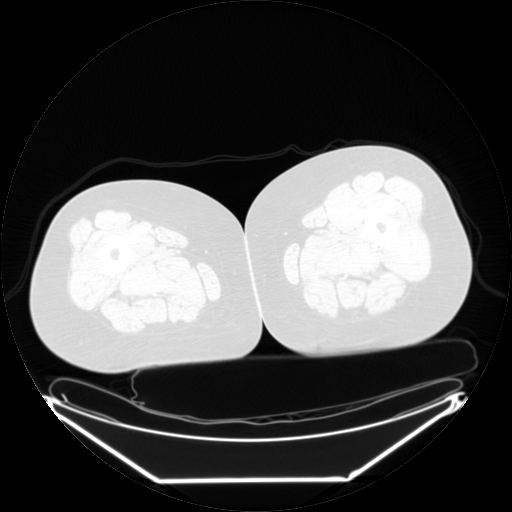
[im 52/257]
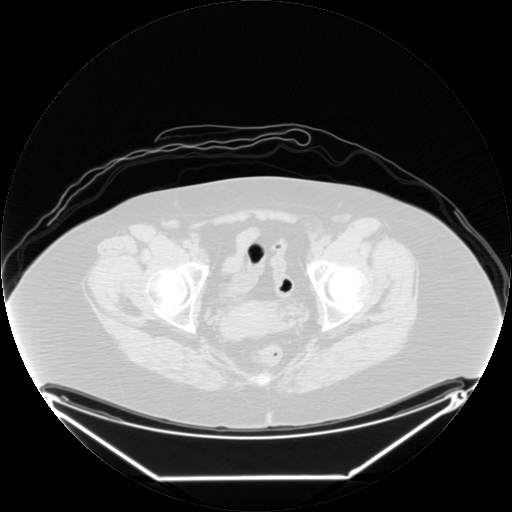
[im 103/257]
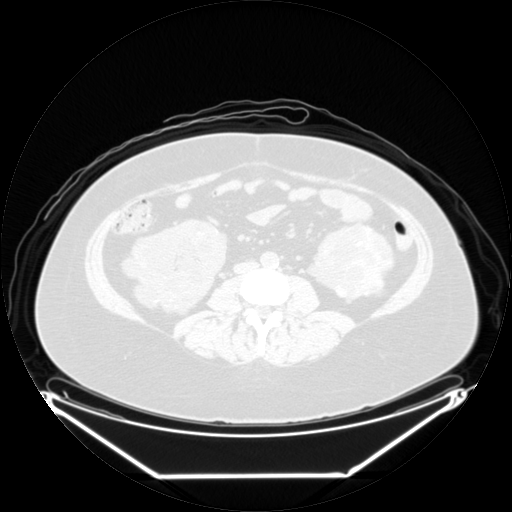
[im 154/257]
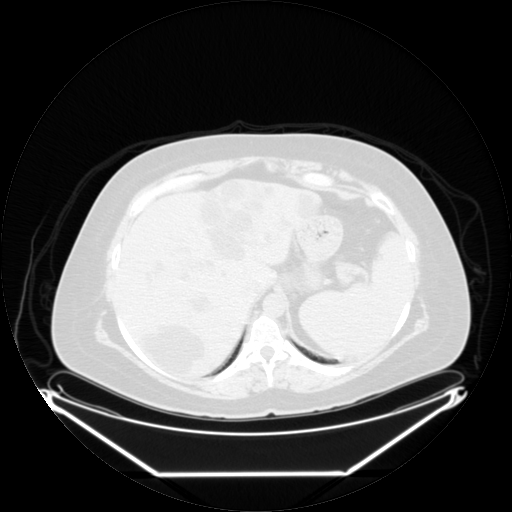
[im 205/257]
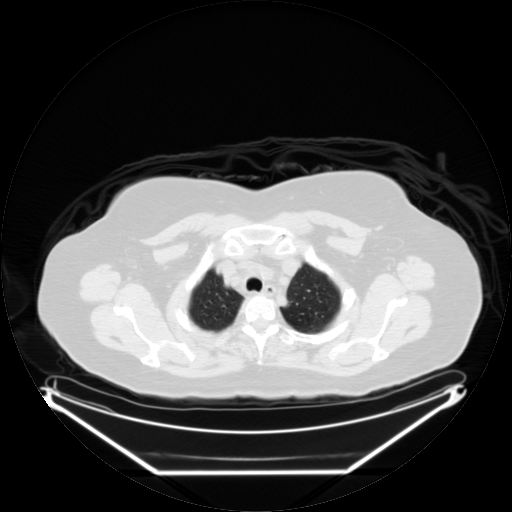
[im 257/257  brain]
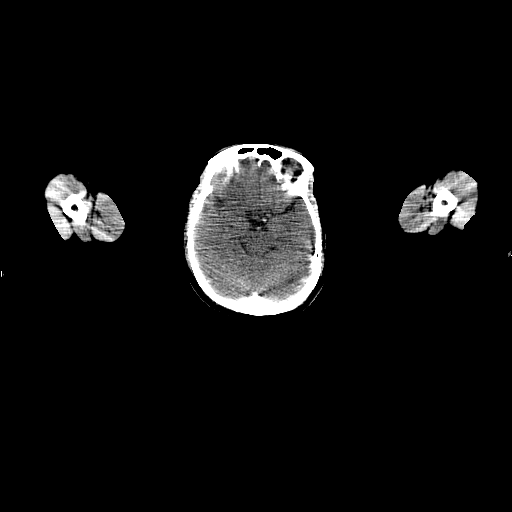

[Series 123: mip · coronal · 3.3mm · 4.69mm/px · 1 of 30 slices shown]
[im 1/30]
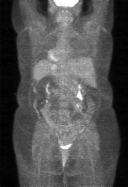

[Series 151: reformatted · axial · 3.3mm · 3.91mm/px · z∈[-870,+0]mm · 6 of 265 slices shown (1 of 2)]
[im 1/265]
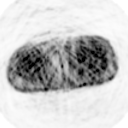
[im 53/265]
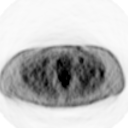
[im 106/265]
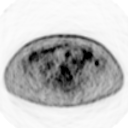
[im 159/265]
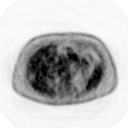
[im 212/265]
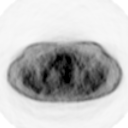
[im 265/265]
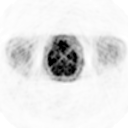

[Series 153: reformatted · coronal · 4.7mm · 6.98mm/px · 1 of 67 slices shown (2 of 2)]
[im 1/67]
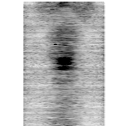

[25 of 25 positions shown; findings below may reference images not displayed]

FINDINGS: PET images demonstrate no abnormal activity within the
neck.  The left lower lobe lung nodule described on the diagnostic
abdominal CT is again identified.  This appears slightly less
conspicuous today.  9 mm on image 71 compared with 1.3 x 1.0 cm on
the prior CT.  This may be related to differences in technique on
this non breath held study or represent slight regression.  This
lesion is not significantly hypermetabolic.  It measures a S.U.V.
max of 1.7.

No abnormal nodal activity within the chest.  No abnormal activity
within the abdomen.  A focus of mild hypermetabolism, measuring a
S.U.V. max of 3.5 corresponds to a not enlarged left ovary on image
193.

CT images performed for attenuation correction demonstrate no
significant findings in the neck.  Mild cardiomegaly.  Mild right
middle lobe atelectasis or scar.  Abdominal/pelvic findings which
will be deferred to recent diagnostic CT.  Again demonstrated are
numerous renal masses of varying complexity.
IMPRESSION: 1.  The left lower lobe lung nodule is not significantly
hypermetabolic.  It appears slightly less conspicuous today.  This
difference may be due to technique or slight regression.
Regardless, differential considerations include most likely a
benign post infectious or inflammatory nodule or a non FDG avid
bronchogenic carcinoma such as a bronchoalveolar carcinoma or low
grade adenocarcinoma.  Recommend follow-up chest CT at 3 months to
confirm stability.
2.  Mild hypermetabolism corresponding to a not enlarged left
ovary.  This is most likely physiologic, especially if the patient
is premenopausal.  If the patient is post menopausal, consider
follow-up with ultrasound at six or 10 weeks to exclude underlying
ovarian lesion.

## 2010-12-03 ENCOUNTER — Ambulatory Visit
Admission: RE | Admit: 2010-12-03 | Discharge: 2010-12-03 | Payer: Self-pay | Source: Home / Self Care | Attending: Vascular Surgery | Admitting: Vascular Surgery

## 2010-12-08 IMAGING — US US TRANSVAGINAL NON-OB
1 series · 14 of 25 positions shown · non-contrast
Comparison: CT 03/27/2009, PET CT 04/23/2009.

CLINICAL DATA: Hypermetabolic activity in the left ovary on PET
scan performed to evaluate a pulmonary nodule.

TRANSABDOMINAL AND TRANSVAGINAL ULTRASOUND OF PELVIS
TECHNIQUE: Both transabdominal and transvaginal ultrasound
examinations of the pelvis were performed including evaluation of
the uterus, ovaries, adnexal regions, and pelvic cul-de-sac.

[Series 1: us pelvis complete modify · 0.25mm/px · 14 of 64 slices shown]
[im 1/64]
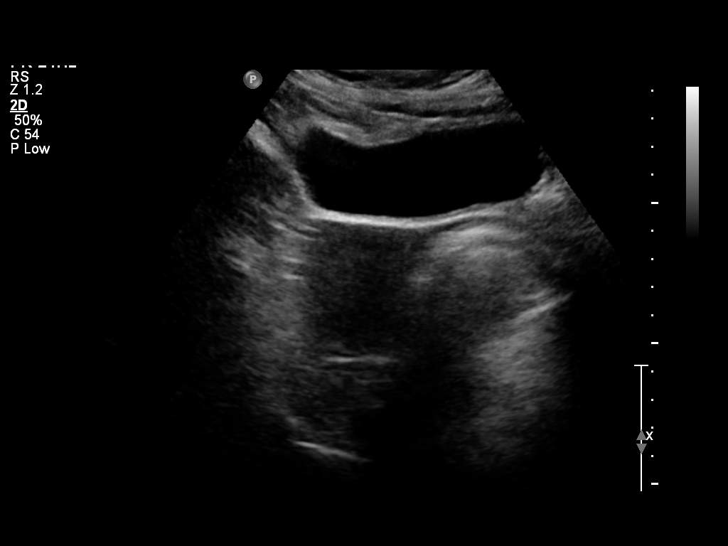
[im 6/64]
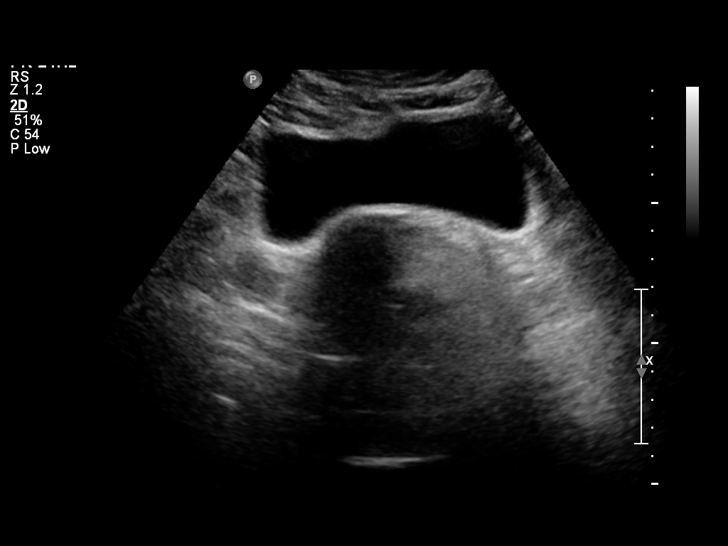
[im 11/64]
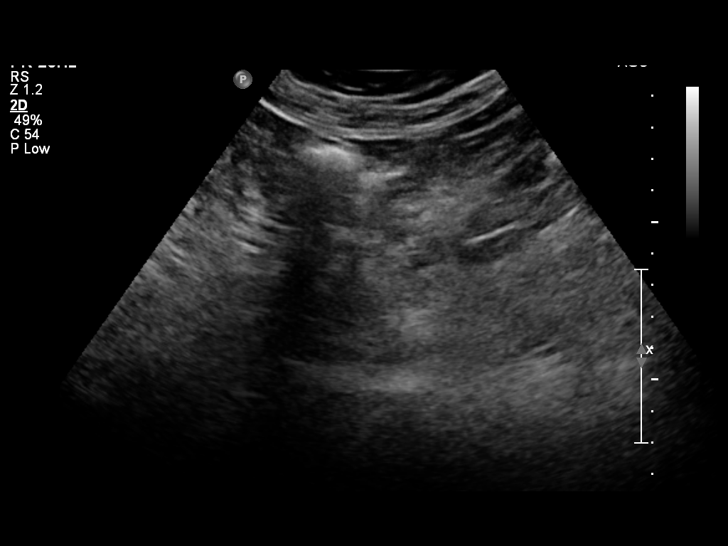
[im 16/64]
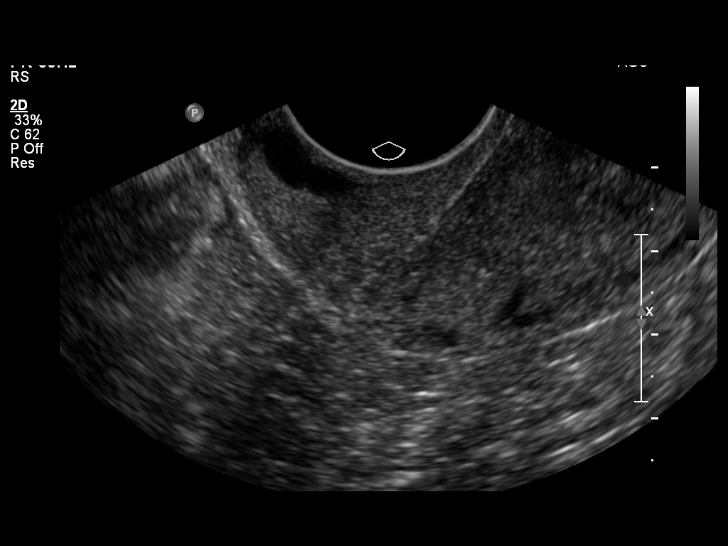
[im 22/64]
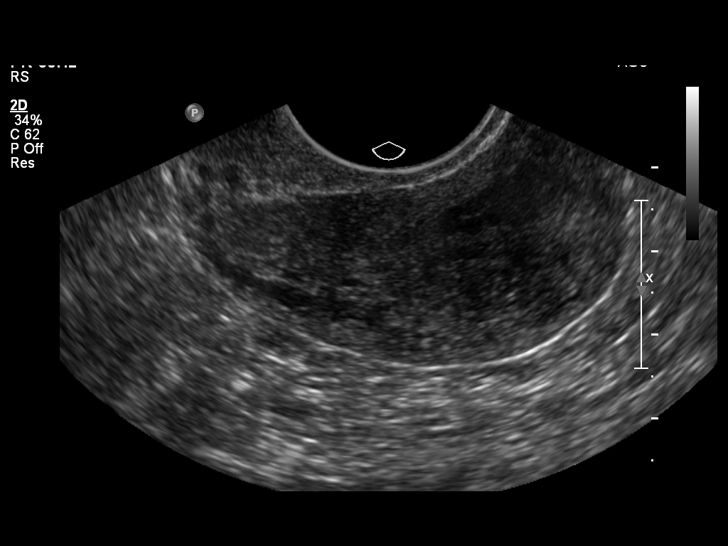
[im 24/64]
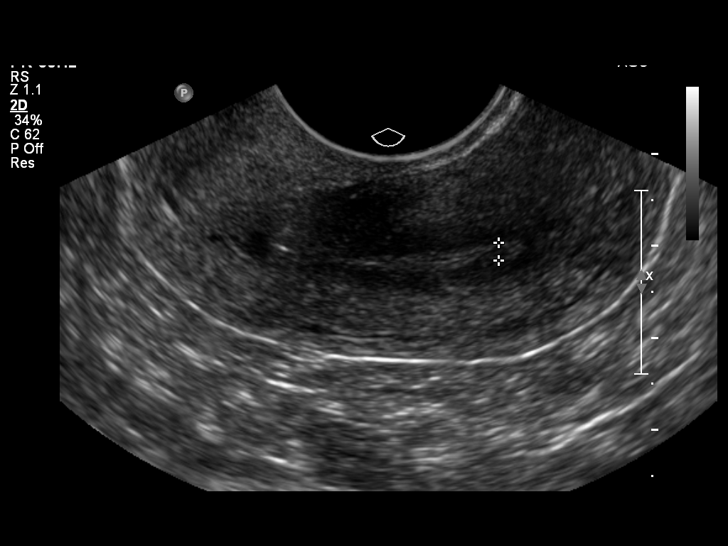
[im 29/64]
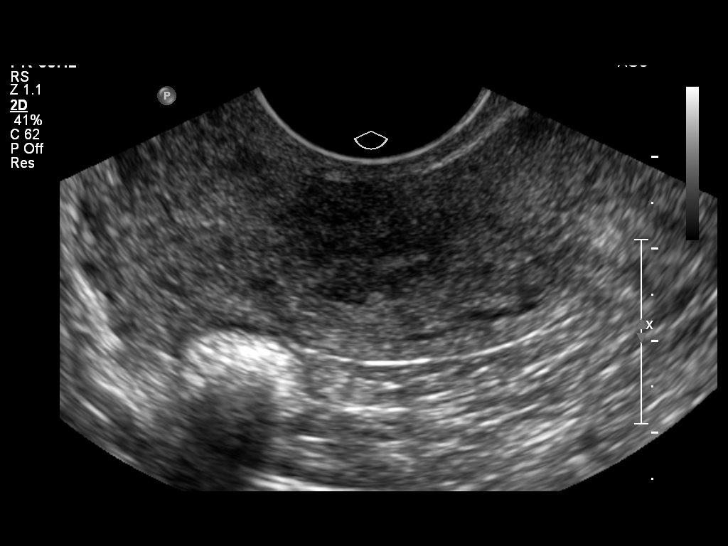
[im 35/64]
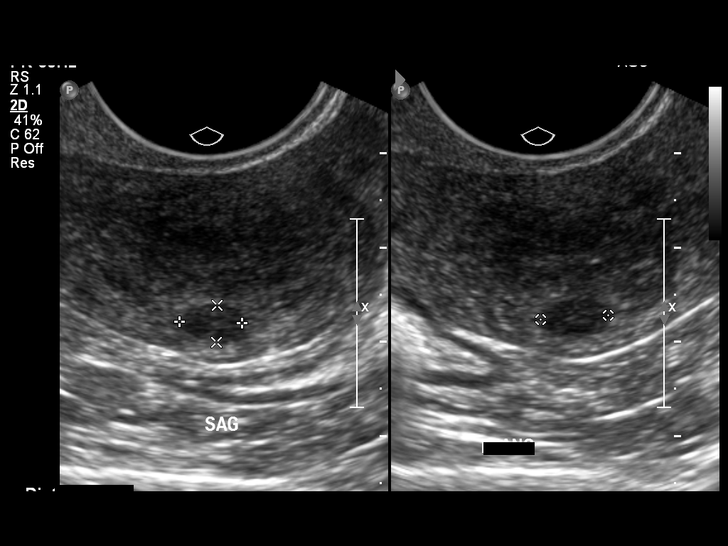
[im 40/64]
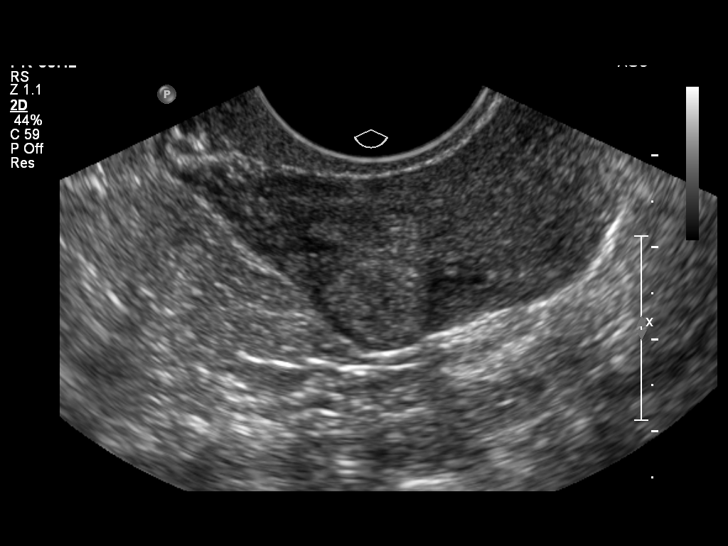
[im 43/64]
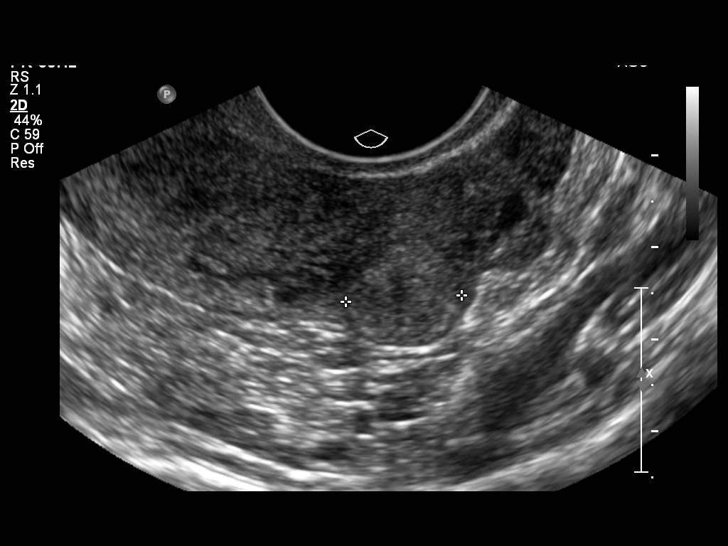
[im 48/64]
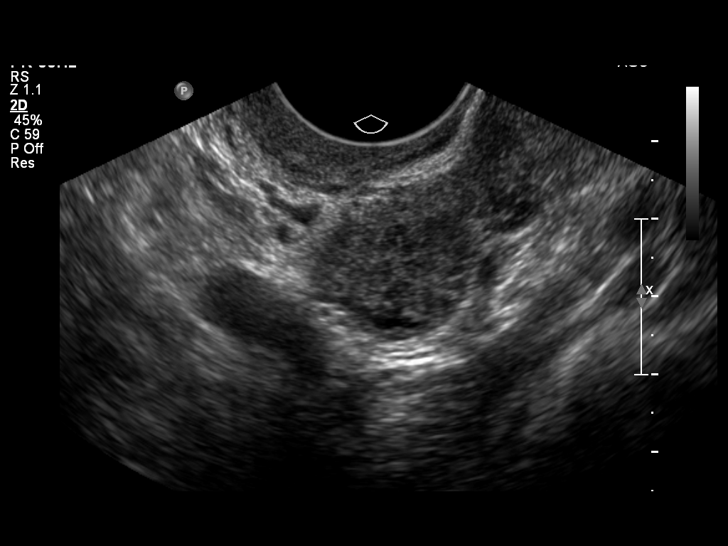
[im 53/64]
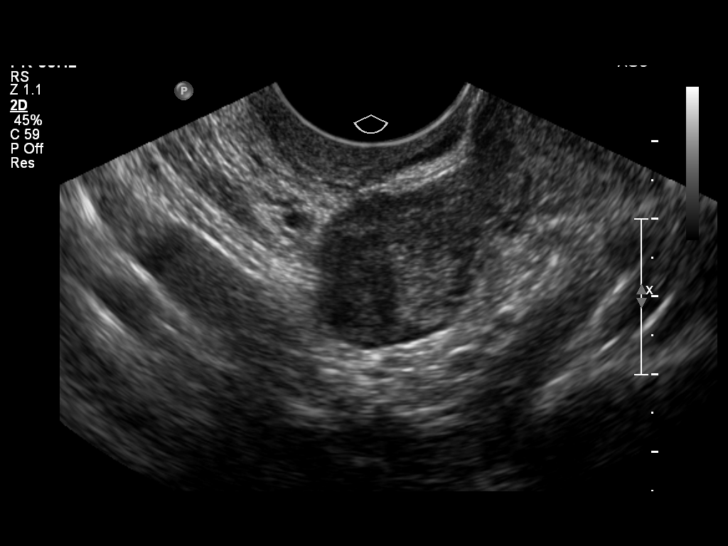
[im 58/64]
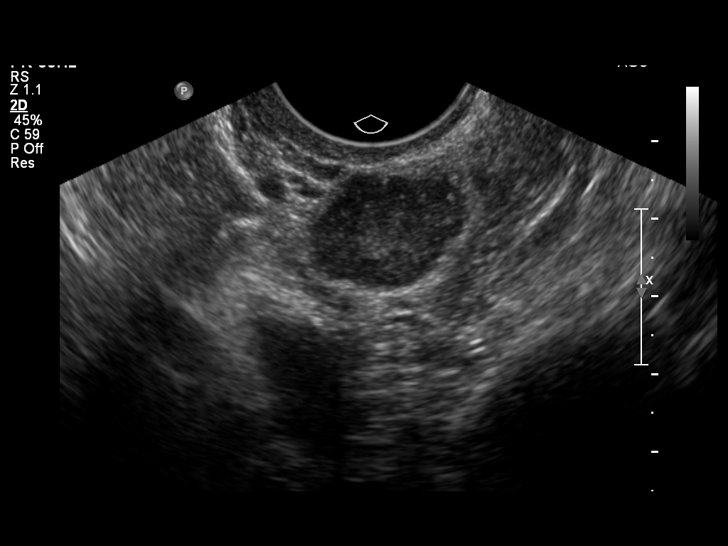
[im 64/64]
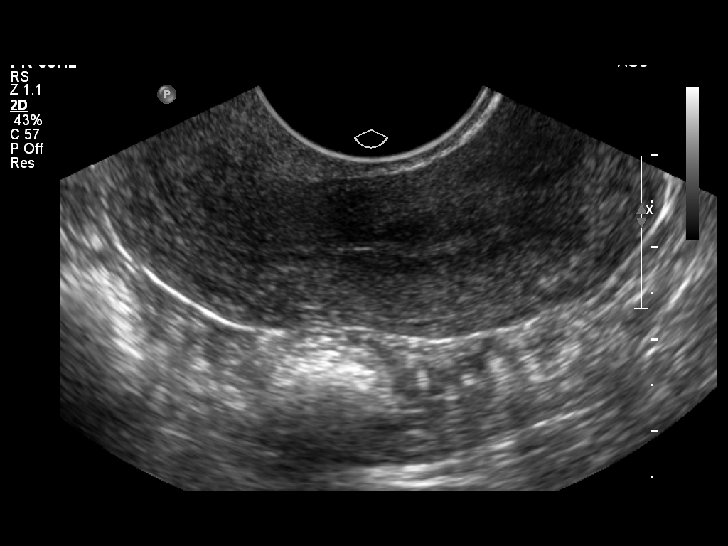

[14 of 25 positions shown; findings below may reference images not displayed]

FINDINGS: Uterus:  Retroverted, retroflexed, with intramural fibroids as
follows:

Anterior uterine fundus, intramural, 0.7 x 0.6 x 0.4 cm

Posterior uterine body, intramural, 1.5 x 0.9 x 0.6 cm

Anterior uterine body, right lateral, intramural, 1.3 x 1.2 x
cm.

Endometrium:  2 mm, uniformly echogenic.

Right Ovary:  Normal

Left Ovary:  Normal

Other Findings:  No free fluid.
IMPRESSION: Fibroid uterus, no sonographic abnormality.

## 2010-12-08 IMAGING — US US ABDOMEN COMPLETE
1 series · 14 of 25 positions shown · non-contrast
Comparison: CT 03/27/2009, ultrasound 02/22/2009

CLINICAL DATA: Polycystic liver and kidney disease.

COMPLETE ABDOMINAL ULTRASOUND

[Series 1: us abdomen complete · 0.25mm/px · 14 of 105 slices shown]
[im 1/105]
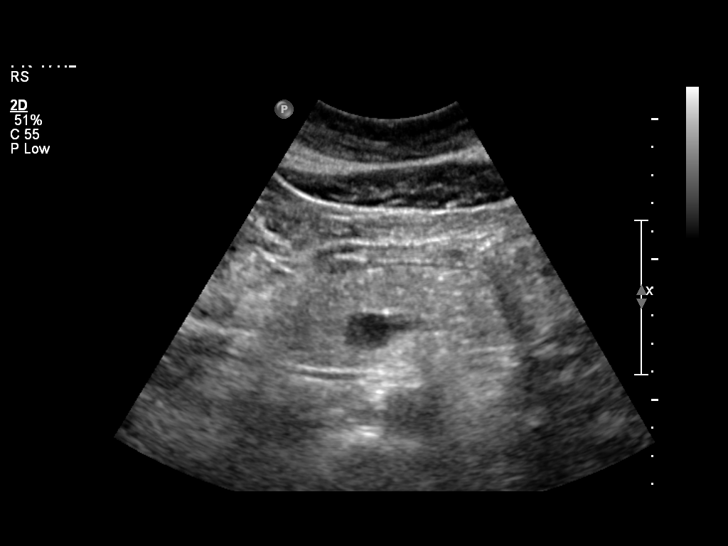
[im 9/105]
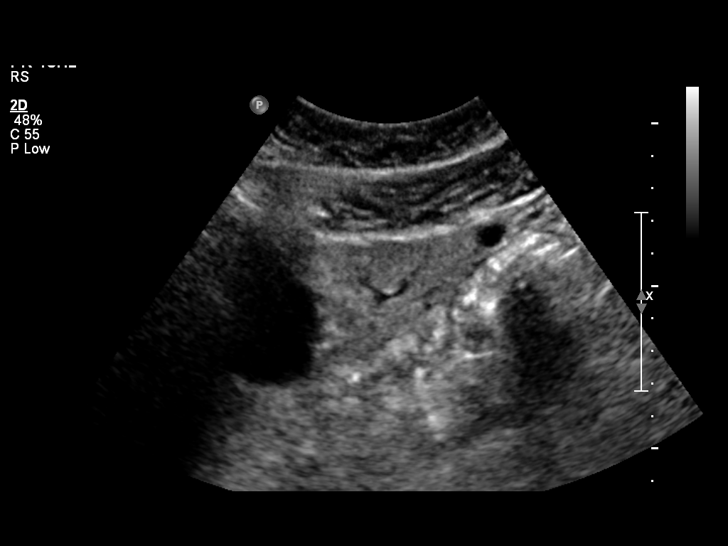
[im 18/105]
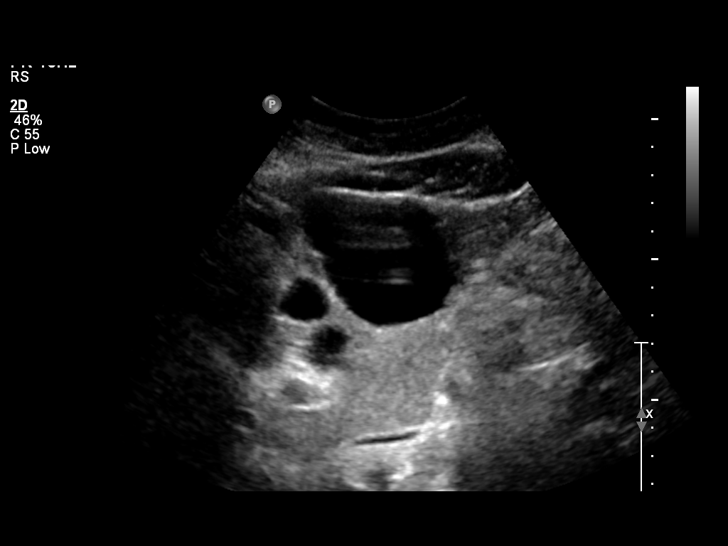
[im 27/105]
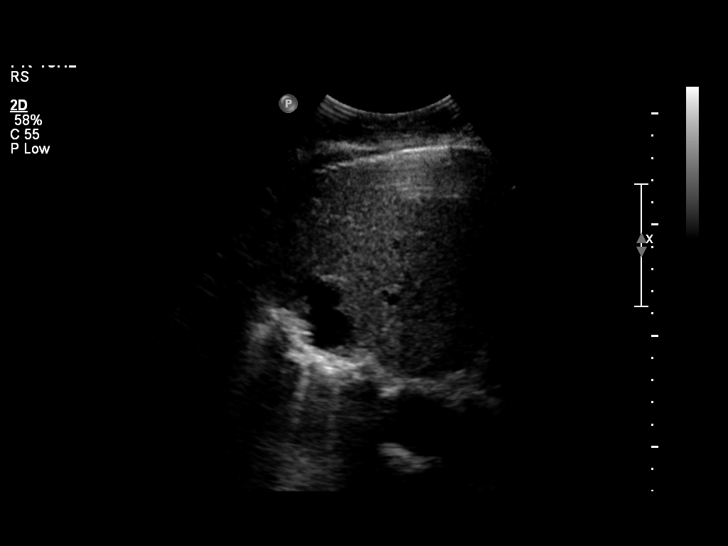
[im 35/105]
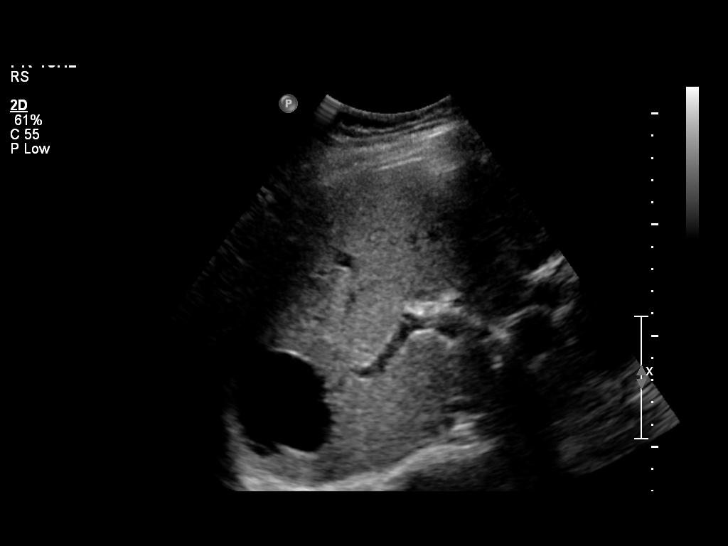
[im 40/105]
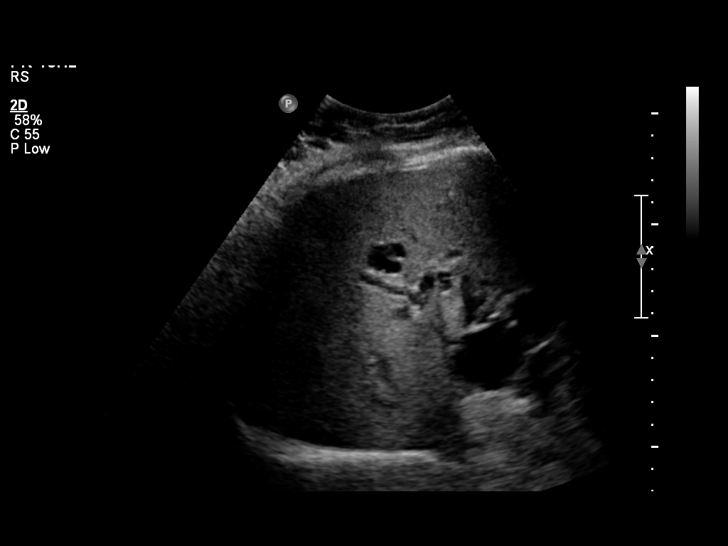
[im 48/105]
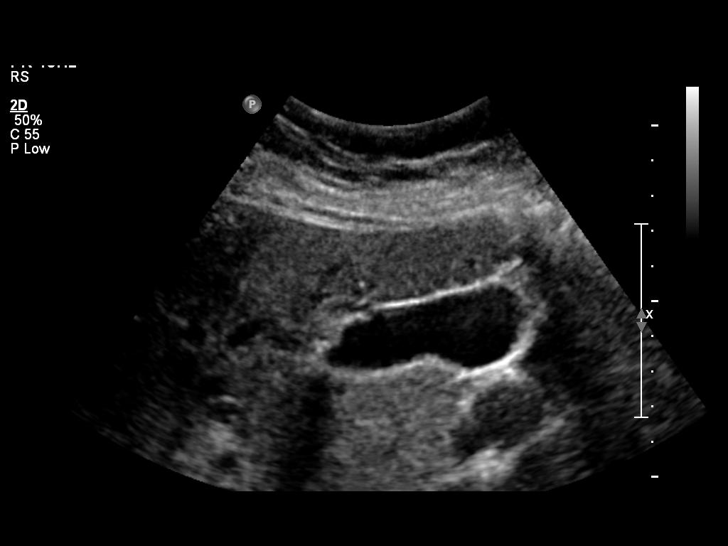
[im 57/105]
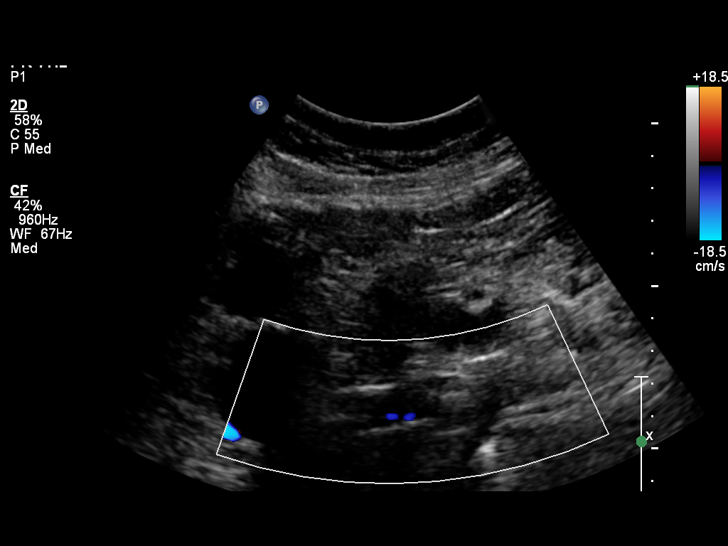
[im 66/105]
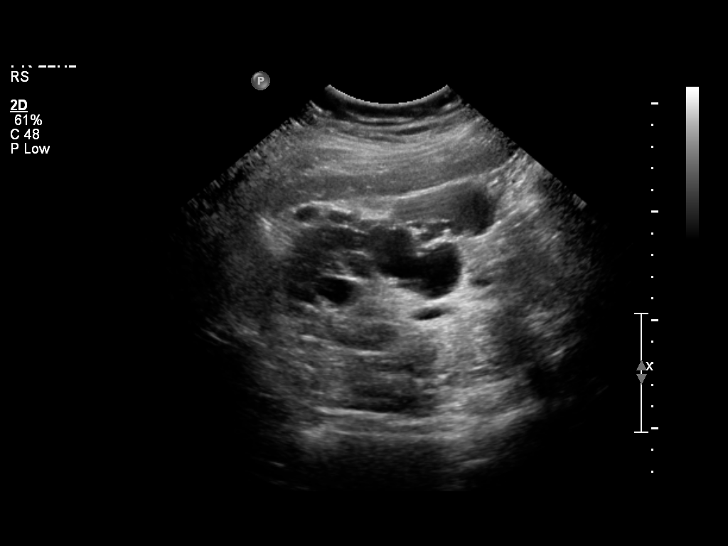
[im 70/105]
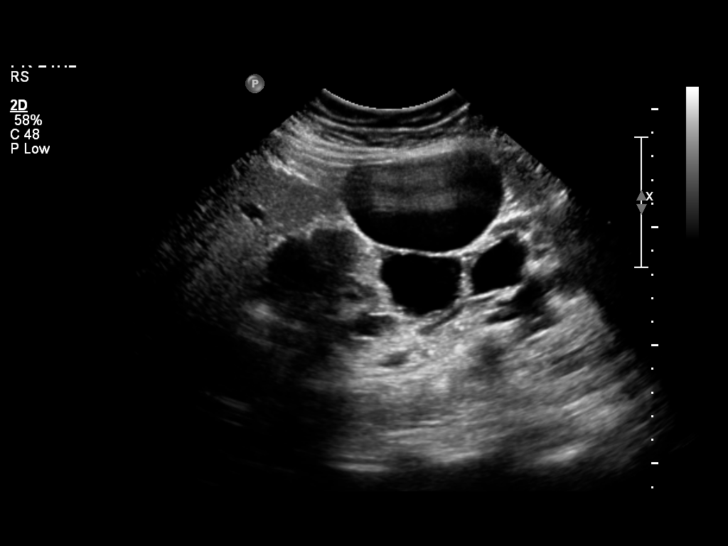
[im 79/105]
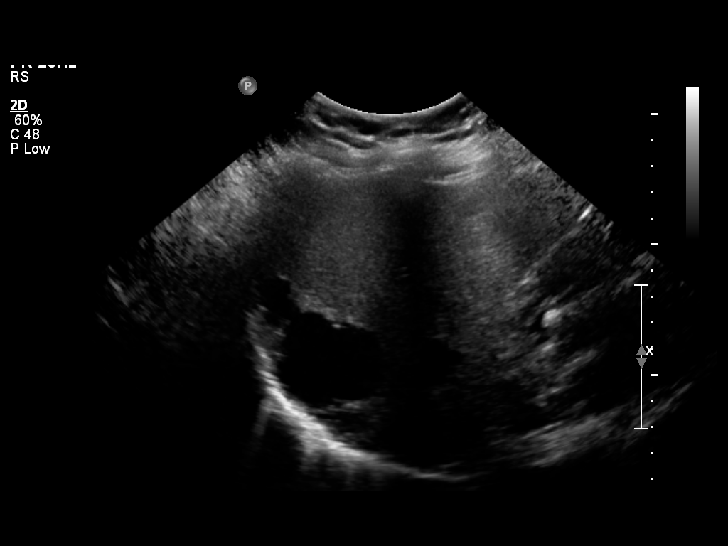
[im 87/105]
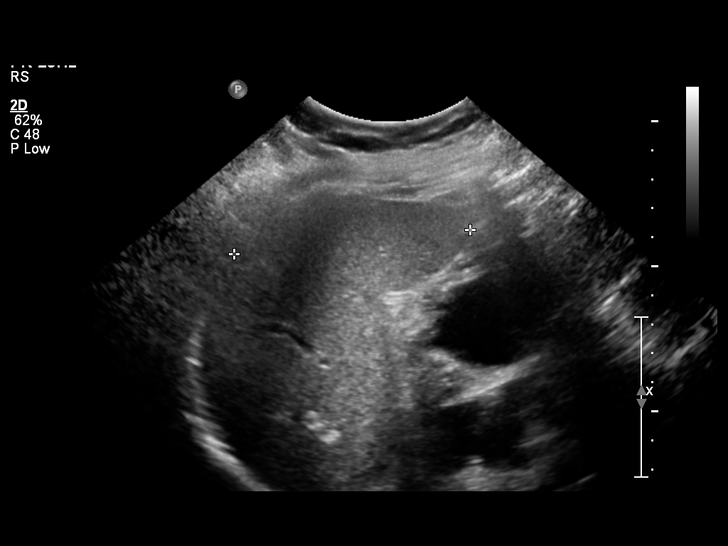
[im 96/105]
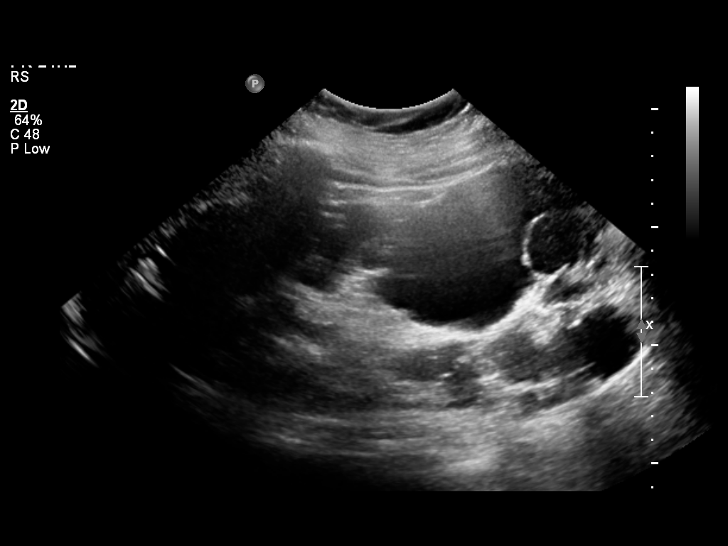
[im 105/105]
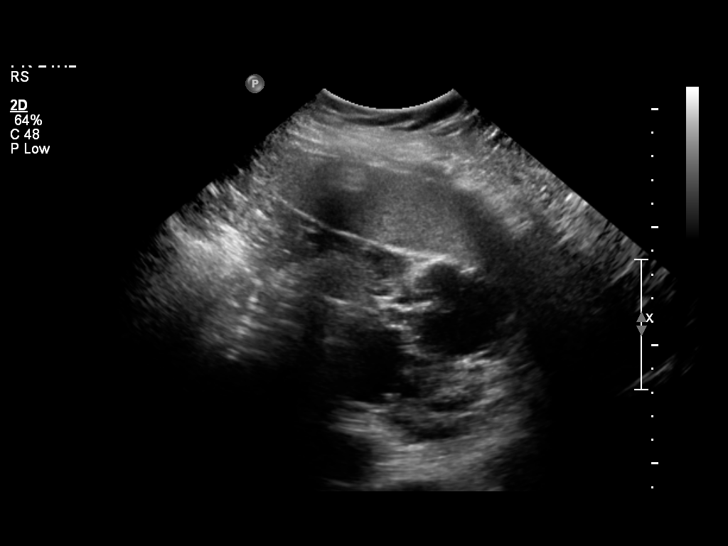

[14 of 25 positions shown; findings below may reference images not displayed]

FINDINGS: Gallbladder:  Normal, no stones

Common bile duct:  4 mm

Liver:  Multiple anechoic cysts again noted, largest in the left
hepatic lobe 5.0 x 5.0 x 4.5 cm and largest in the right hepatic
lobe 4.7 x 5.1 x 4.4 cm.  No intrahepatic ductal dilatation.

IVC:  Normal

Pancreas:  Normal

Spleen:  Normal

Right Kidney:  22.6 cm, multiple simple appearing cysts again
noted.  No hydronephrosis.  Largest cyst measured at the mid aspect
of the kidney 6.8 x 6.8 x 4.0 cm.

Left Kidney:  25 cm, multiple simple appearing cysts again noted.
No hydronephrosis.  Largest measured cyst in the inferior kidney
7.3 x 6.5 x 4.9 cm.

Abdominal aorta:  Normal
IMPRESSION: Multiple renal and hepatic cysts, compatible with polycystic kidney
disease.  No acute abnormality.

## 2010-12-14 ENCOUNTER — Encounter: Payer: Self-pay | Admitting: Nephrology

## 2010-12-16 ENCOUNTER — Other Ambulatory Visit
Admission: RE | Admit: 2010-12-16 | Discharge: 2010-12-16 | Payer: Self-pay | Source: Home / Self Care | Admitting: *Deleted

## 2010-12-16 ENCOUNTER — Other Ambulatory Visit: Payer: Self-pay | Admitting: Family Medicine

## 2010-12-29 ENCOUNTER — Other Ambulatory Visit: Payer: Self-pay | Admitting: Hematology & Oncology

## 2010-12-29 ENCOUNTER — Ambulatory Visit (HOSPITAL_BASED_OUTPATIENT_CLINIC_OR_DEPARTMENT_OTHER): Payer: 59 | Admitting: Hematology & Oncology

## 2010-12-29 DIAGNOSIS — I7300 Raynaud's syndrome without gangrene: Secondary | ICD-10-CM

## 2010-12-29 DIAGNOSIS — Q613 Polycystic kidney, unspecified: Secondary | ICD-10-CM

## 2010-12-29 DIAGNOSIS — N19 Unspecified kidney failure: Secondary | ICD-10-CM

## 2010-12-29 DIAGNOSIS — D472 Monoclonal gammopathy: Secondary | ICD-10-CM

## 2010-12-29 LAB — CBC WITH DIFFERENTIAL (CANCER CENTER ONLY)
BASO%: 0.4 % (ref 0.0–2.0)
EOS%: 2.8 % (ref 0.0–7.0)
HCT: 31.2 % — ABNORMAL LOW (ref 34.8–46.6)
MCH: 32 pg (ref 26.0–34.0)
MONO%: 6.2 % (ref 0.0–13.0)
NEUT#: 3.9 10*3/uL (ref 1.5–6.5)
RBC: 3.32 10*6/uL — ABNORMAL LOW (ref 3.70–5.32)

## 2010-12-29 LAB — CHCC SATELLITE - SMEAR

## 2010-12-31 LAB — SPEP & IFE WITH QIG
Alpha-2-Globulin: 9.4 % (ref 7.1–11.8)
Beta 2: 5.7 % (ref 3.2–6.5)
Beta Globulin: 6.3 % (ref 4.7–7.2)
Gamma Globulin: 14.8 % (ref 11.1–18.8)
IgA: 267 mg/dL (ref 68–378)
IgG (Immunoglobin G), Serum: 1110 mg/dL (ref 694–1618)
M-Spike, %: 0.5 g/dL
Total Protein, Serum Electrophoresis: 7.1 g/dL (ref 6.0–8.3)

## 2010-12-31 LAB — RETICULOCYTES (CHCC)
ABS Retic: 33.7 10*3/uL (ref 19.0–186.0)
RBC.: 3.37 MIL/uL — ABNORMAL LOW (ref 3.87–5.11)
Retic Ct Pct: 1 % (ref 0.4–3.1)

## 2010-12-31 LAB — COMPREHENSIVE METABOLIC PANEL
ALT: 12 U/L (ref 0–35)
AST: 20 U/L (ref 0–37)
Alkaline Phosphatase: 65 U/L (ref 39–117)
BUN: 66 mg/dL — ABNORMAL HIGH (ref 6–23)
Chloride: 100 mEq/L (ref 96–112)
Creatinine, Ser: 5.6 mg/dL — ABNORMAL HIGH (ref 0.40–1.20)
Total Bilirubin: 0.8 mg/dL (ref 0.3–1.2)

## 2010-12-31 LAB — KAPPA/LAMBDA LIGHT CHAINS
Kappa:Lambda Ratio: 0.71 (ref 0.26–1.65)
Lambda Free Lght Chn: 6.19 mg/dL — ABNORMAL HIGH (ref 0.57–2.63)

## 2010-12-31 LAB — ERYTHROPOIETIN: Erythropoietin: 18.7 m[IU]/mL (ref 2.6–34.0)

## 2010-12-31 LAB — FERRITIN: Ferritin: 393 ng/mL — ABNORMAL HIGH (ref 10–291)

## 2011-01-29 ENCOUNTER — Ambulatory Visit (INDEPENDENT_AMBULATORY_CARE_PROVIDER_SITE_OTHER): Payer: 59

## 2011-01-29 DIAGNOSIS — N186 End stage renal disease: Secondary | ICD-10-CM

## 2011-01-29 NOTE — Assessment & Plan Note (Signed)
OFFICE VISIT  April [name], April [name] DOB:  02-Feb-1956                                       01/29/2011 [id number]  CHIEF COMPLAINT:  Bulge at antecubital fossa, status post AV fistula.  HISTORY OF PRESENT ILLNESS:  Patient is a 55 year old woman who had a left brachiocephalic fistula placed in November 2011 in anticipation of hemodialysis for polycystic kidney disease.  She returns today because she had recently slept with her left arm underneath and in a flexed position.  She noted the next morning that she had numbness and tingling throughout her arm. This resolved, but she also noticed increased bulge at the area of the fistula anastomosis.  This is subsequently reduced in size but is not as flat as it was.  She says she has some stinging sensation in the area, but other than that no other issues.  Her wounds are all well-healed, and she has no other issues with the arm.  There no changes in her medical history as per our intake form.  Of note, the patient states she has been hearing increased heartbeat in her "head" at night but denies any signs of stroke, amaurosis, or other TIA symptoms.  PHYSICAL EXAMINATION:  This is a well-developed, well-nourished woman in no acute distress.  Her heart rate was 78.  Her sats were 99%. Respiratory rate was 10.  She had no carotid bruits bilaterally.  Left upper extremity:  She had a good thrill and bruit.  She had good grip, good sensation and motion in the left hand.  She had a palpable radial pulse.  Her AV fistula site was well-healed.  There is no sign of pseudoaneurysm or other issues with the fistula.  She had a good thrill and bruit up to the mid upper arm in the fistula, and it was fairly easily palpable.  ASSESSMENT:  Dilatation of the arteriovenous fistula at the antecubital space after sleeping in a flexed position.  This is improved with time. Plan is to follow up as needed.  Wray Kearns,  PA-C  [place]. Scot Dock, M.D. Electronically Signed  RR/MEDQ  D:  01/29/2011  T:  01/29/2011  Job:  [id number]

## 2011-02-03 LAB — POCT I-STAT 4, (NA,K, GLUC, HGB,HCT)
Hemoglobin: 11.9 g/dL — ABNORMAL LOW (ref 12.0–15.0)
Potassium: 4.6 mEq/L (ref 3.5–5.1)

## 2011-02-03 LAB — SURGICAL PCR SCREEN
MRSA, PCR: NEGATIVE
Staphylococcus aureus: NEGATIVE

## 2011-02-05 IMAGING — CT CT CHEST W/O CM
1 of 2 series · 14 of 30 positions shown, 18 images · non-contrast
Comparison: CT of the abdomen on 03/27/2009 and PET scan on
04/23/2009

CLINICAL DATA: Reevaluate left lower lung nodule seen on prior PET
scan in April 2009.  Nonsmoker.  History of asthma.  Known
polycystic kidney disease

CT CHEST WITHOUT CONTRAST
TECHNIQUE: Multidetector CT imaging of the chest was performed
following the standard protocol without IV contrast.

[Series 2: routine chest w/o · axial · non-contrast · 0.94mm/px · z∈[+217,+472]mm · 14 of 61 slices shown, 18 images]
[im 5/61  mediastinal]
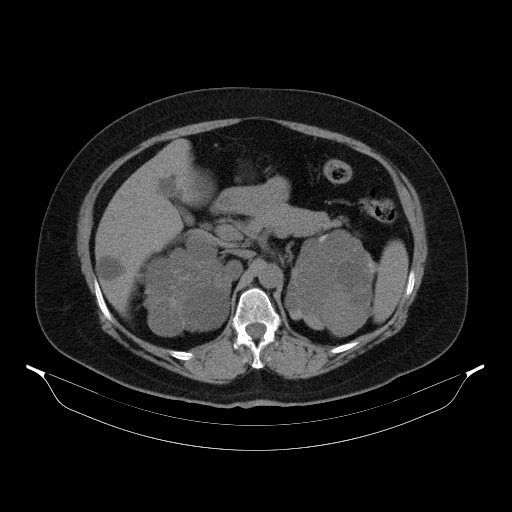
[im 5/61  lung]
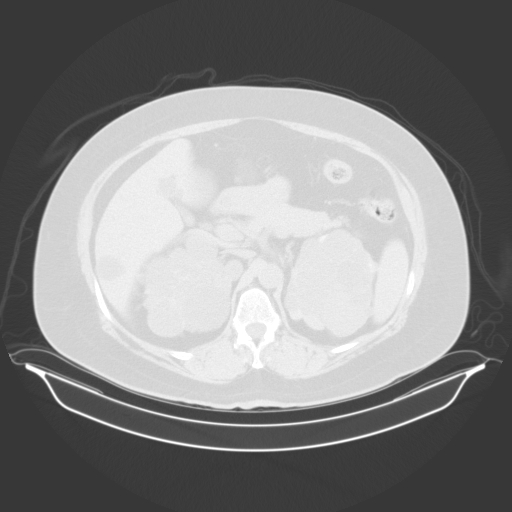
[im 9/61  lung]
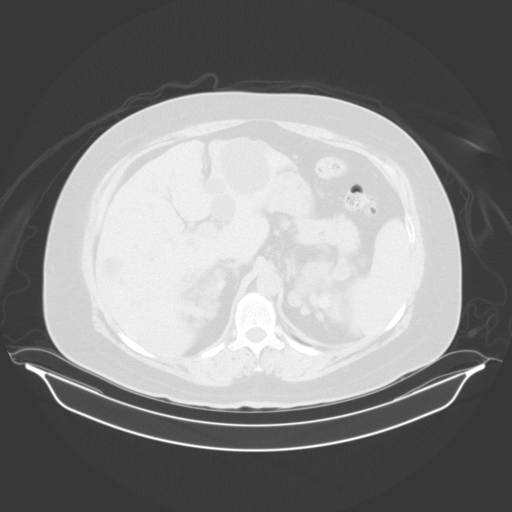
[im 13/61  lung]
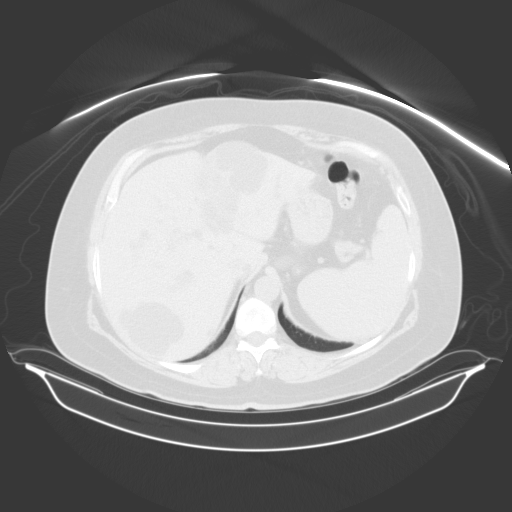
[im 18/61  lung]
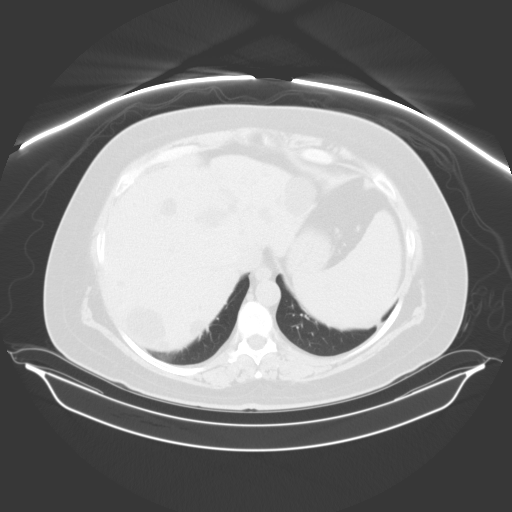
[im 22/61  mediastinal]
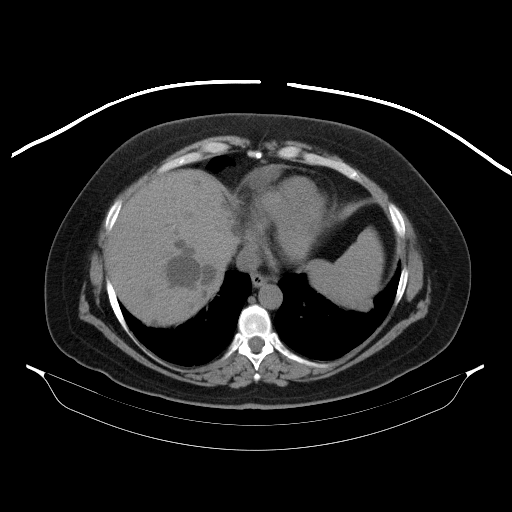
[im 22/61  lung]
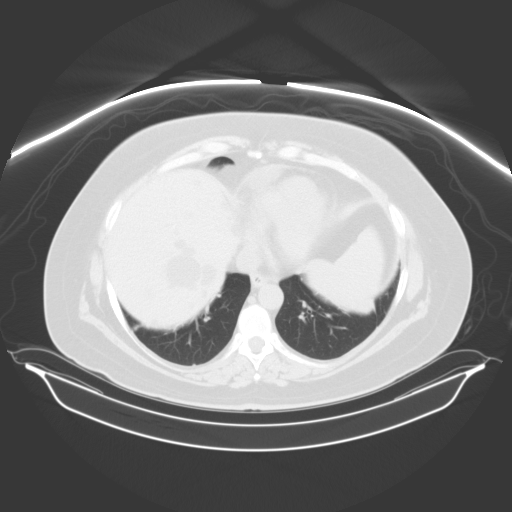
[im 26/61  lung]
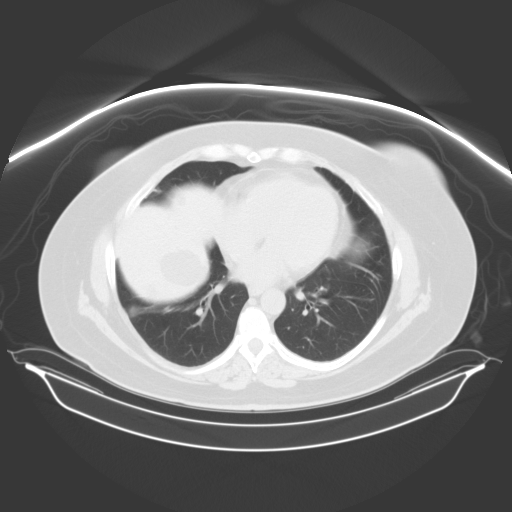
[im 29/61  lung]
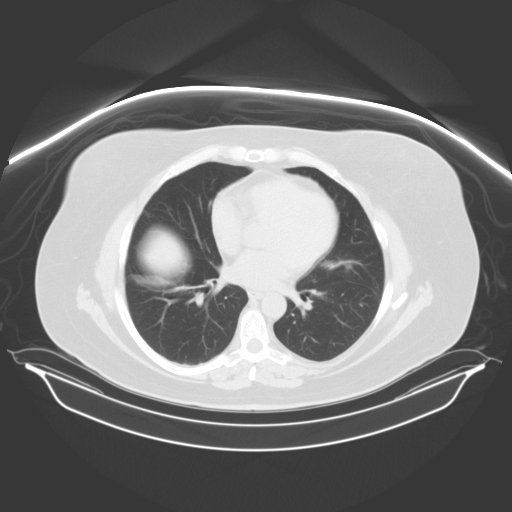
[im 31/61  lung]
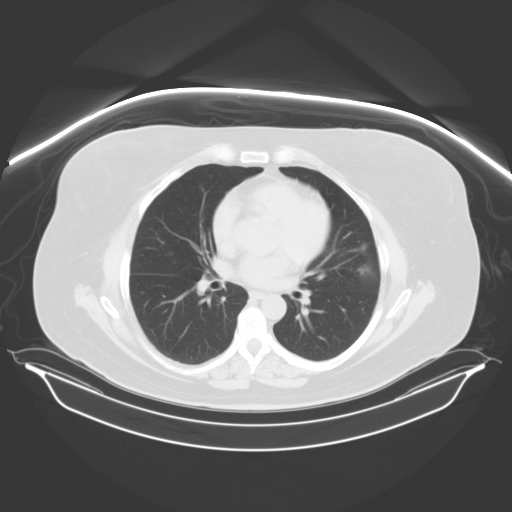
[im 35/61  mediastinal]
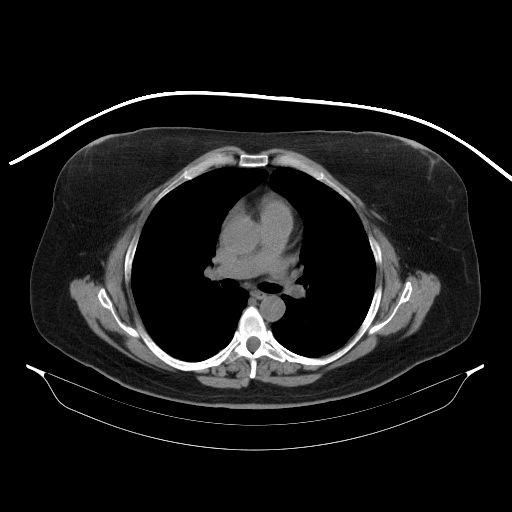
[im 35/61  lung]
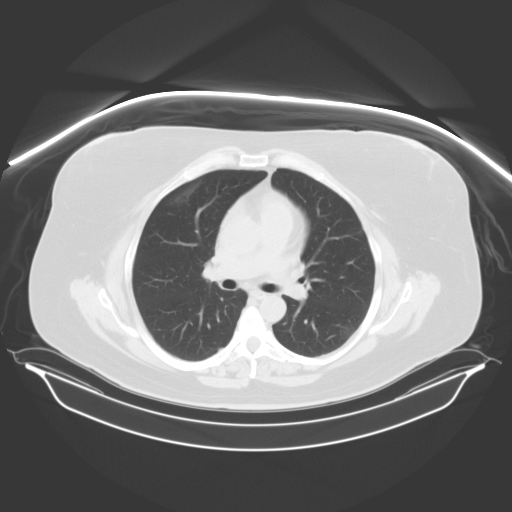
[im 39/61  lung]
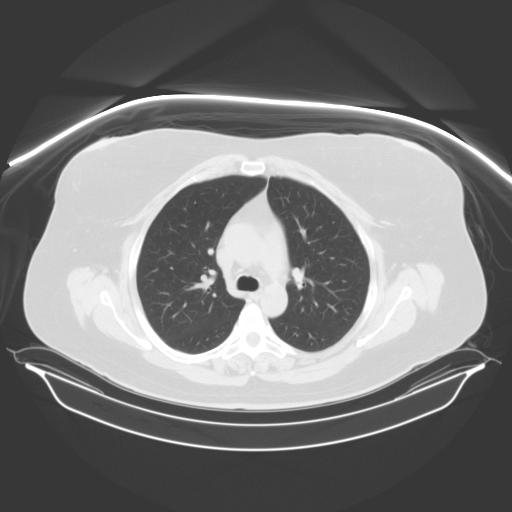
[im 43/61  lung]
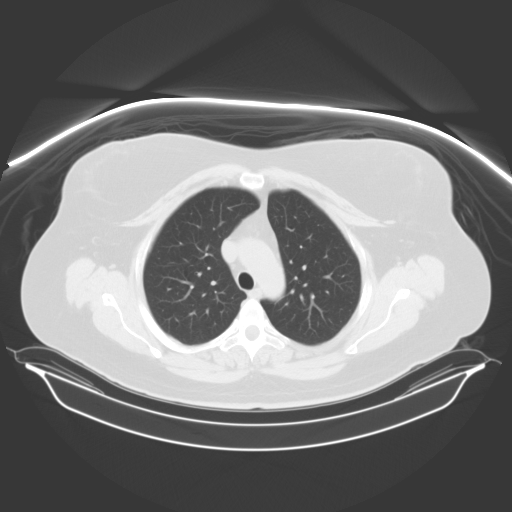
[im 48/61  lung]
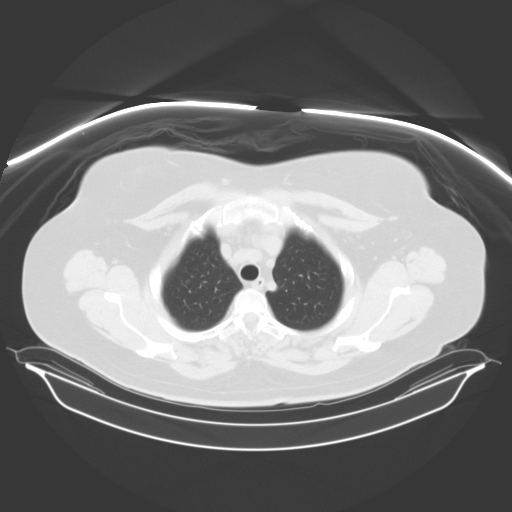
[im 52/61  mediastinal]
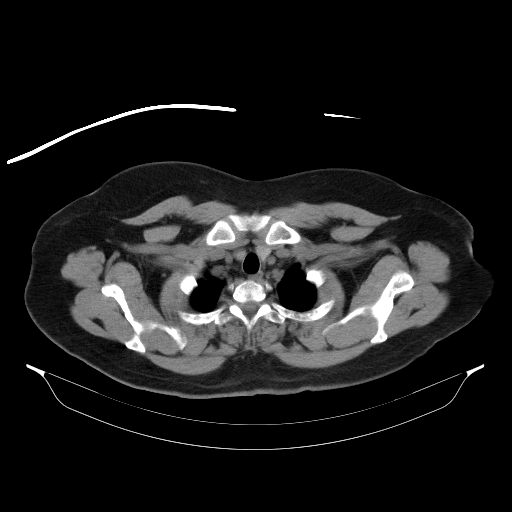
[im 52/61  lung]
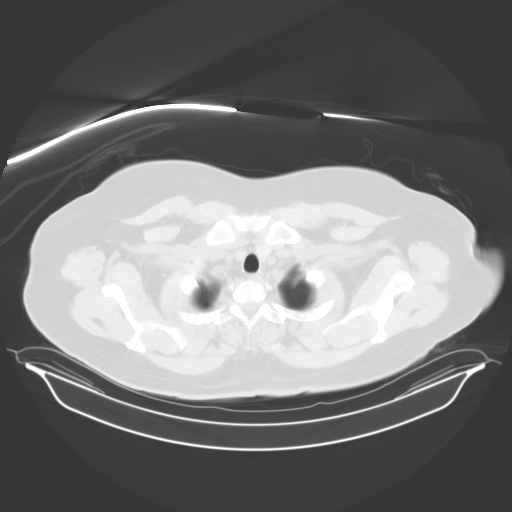
[im 56/61  lung]
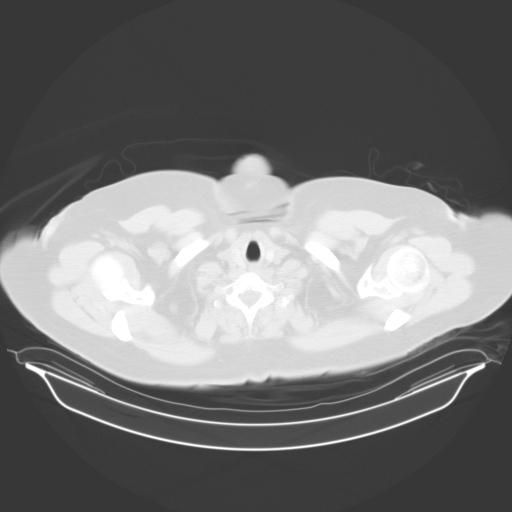

[14 of 30 positions shown; findings below may reference images not displayed]

FINDINGS: The previously noted left lower lobe nodule is no longer
apparent, compatible with interval resolution of an inflammatory
pseudotumor or other benign process.  Some focal linear bibasilar
scarring is seen as well as some linear scarring in the lingula and
lateral aspect of the right middle lobe.  No new nodular foci are
seen.

Multiple low density foci are again seen within both kidneys and
the liver compatible with the patient's history of polycystic
kidney disease and multiple cysts in these organs.  The adrenal
glands , spleen and visualized portion of the pancreas have a
normal noncontrast appearance.  No abnormal adenopathy is seen.
Bony structures demonstrate some minimal degenerative changes of
the thoracic spine and are otherwise intact.
IMPRESSION: Interval resolution of the left lower lobe nodule since the prior
exam compatible with resolution of a benign process.  No further
follow-up is felt warranted.

## 2011-02-15 LAB — DIFFERENTIAL
Eosinophils Absolute: 0.1 10*3/uL (ref 0.0–0.7)
Eosinophils Relative: 1 % (ref 0–5)
Lymphs Abs: 1.4 10*3/uL (ref 0.7–4.0)
Monocytes Absolute: 0.5 10*3/uL (ref 0.1–1.0)
Monocytes Relative: 5 % (ref 3–12)

## 2011-02-15 LAB — LIPID PANEL
HDL: 34 mg/dL — ABNORMAL LOW (ref >39)
Total CHOL/HDL Ratio: 5.6 RATIO
Triglycerides: 136 mg/dL (ref <150)

## 2011-02-15 LAB — CARDIAC PANEL(CRET KIN+CKTOT+MB+TROPI)
CK, MB: 1 ng/mL (ref 0.3–4.0)
Relative Index: INVALID (ref 0.0–2.5)
Relative Index: INVALID (ref 0.0–2.5)
Relative Index: INVALID (ref 0.0–2.5)
Total CK: 39 U/L (ref 7–177)
Troponin I: 0.01 ng/mL (ref 0.00–0.06)
Troponin I: 0.01 ng/mL (ref 0.00–0.06)

## 2011-02-15 LAB — COMPREHENSIVE METABOLIC PANEL
ALT: 16 U/L (ref 0–35)
ALT: 18 U/L (ref 0–35)
AST: 16 U/L (ref 0–37)
AST: 20 U/L (ref 0–37)
Albumin: 4.3 g/dL (ref 3.5–5.2)
CO2: 26 mEq/L (ref 19–32)
Calcium: 9.7 mg/dL (ref 8.4–10.5)
Calcium: 9.8 mg/dL (ref 8.4–10.5)
Creatinine, Ser: 3.61 mg/dL — ABNORMAL HIGH (ref 0.4–1.2)
GFR calc Af Amer: 16 mL/min — ABNORMAL LOW (ref >60)
GFR calc Af Amer: 16 mL/min — ABNORMAL LOW (ref >60)
GFR calc non Af Amer: 13 mL/min — ABNORMAL LOW (ref >60)
GFR calc non Af Amer: 13 mL/min — ABNORMAL LOW (ref >60)
Glucose, Bld: 101 mg/dL — ABNORMAL HIGH (ref 70–99)
Sodium: 140 mEq/L (ref 135–145)
Sodium: 141 mEq/L (ref 135–145)
Total Protein: 7.4 g/dL (ref 6.0–8.3)

## 2011-02-15 LAB — CBC
Hemoglobin: 9.7 g/dL — ABNORMAL LOW (ref 12.0–15.0)
MCHC: 32.6 g/dL (ref 30.0–36.0)
MCHC: 32.7 g/dL (ref 30.0–36.0)
Platelets: 146 10*3/uL — ABNORMAL LOW (ref 150–400)
RBC: 3.13 MIL/uL — ABNORMAL LOW (ref 3.87–5.11)
RBC: 3.59 MIL/uL — ABNORMAL LOW (ref 3.87–5.11)
RBC: 3.95 MIL/uL (ref 3.87–5.11)
WBC: 10.5 10*3/uL (ref 4.0–10.5)
WBC: 6.8 10*3/uL (ref 4.0–10.5)
WBC: 6.9 10*3/uL (ref 4.0–10.5)

## 2011-02-15 LAB — BASIC METABOLIC PANEL
CO2: 26 mEq/L (ref 19–32)
Calcium: 9.5 mg/dL (ref 8.4–10.5)
Creatinine, Ser: 3.67 mg/dL — ABNORMAL HIGH (ref 0.4–1.2)
GFR calc Af Amer: 16 mL/min — ABNORMAL LOW (ref >60)
GFR calc non Af Amer: 13 mL/min — ABNORMAL LOW (ref >60)
Sodium: 140 mEq/L (ref 135–145)

## 2011-02-15 LAB — CK TOTAL AND CKMB (NOT AT ARMC)
Relative Index: INVALID (ref 0.0–2.5)
Total CK: 40 U/L (ref 7–177)

## 2011-02-15 LAB — POCT CARDIAC MARKERS
CKMB, poc: 1.1 ng/mL (ref 1.0–8.0)
Myoglobin, poc: 202 ng/mL (ref 12–200)
Troponin i, poc: <0.05 ng/mL (ref 0.00–0.09)

## 2011-02-15 LAB — TROPONIN I: Troponin I: <0.01 ng/mL (ref 0.00–0.06)

## 2011-02-26 ENCOUNTER — Other Ambulatory Visit: Payer: Self-pay | Admitting: Nephrology

## 2011-02-26 ENCOUNTER — Ambulatory Visit
Admission: RE | Admit: 2011-02-26 | Discharge: 2011-02-26 | Disposition: A | Discharge status: Home / Self Care | Payer: 59 | Source: Ambulatory Visit | Attending: Nephrology | Admitting: Nephrology

## 2011-02-26 DIAGNOSIS — R0789 Other chest pain: Secondary | ICD-10-CM

## 2011-03-02 LAB — GLUCOSE, CAPILLARY: Glucose-Capillary: 95 mg/dL (ref 70–99)

## 2011-03-03 ENCOUNTER — Encounter (HOSPITAL_COMMUNITY): Payer: 59 | Attending: Nephrology

## 2011-03-03 ENCOUNTER — Other Ambulatory Visit: Payer: Self-pay | Admitting: Nephrology

## 2011-03-03 DIAGNOSIS — D638 Anemia in other chronic diseases classified elsewhere: Secondary | ICD-10-CM | POA: Insufficient documentation

## 2011-03-03 DIAGNOSIS — N184 Chronic kidney disease, stage 4 (severe): Secondary | ICD-10-CM | POA: Insufficient documentation

## 2011-03-04 LAB — POCT HEMOGLOBIN-HEMACUE: Hemoglobin: 9.1 g/dL — ABNORMAL LOW (ref 12.0–15.0)

## 2011-03-17 ENCOUNTER — Other Ambulatory Visit: Payer: Self-pay | Admitting: Nephrology

## 2011-03-17 ENCOUNTER — Encounter (HOSPITAL_COMMUNITY): Payer: 59

## 2011-03-18 LAB — POCT HEMOGLOBIN-HEMACUE: Hemoglobin: 10.4 g/dL — ABNORMAL LOW (ref 12.0–15.0)

## 2011-03-31 ENCOUNTER — Other Ambulatory Visit: Payer: Self-pay | Admitting: Nephrology

## 2011-03-31 ENCOUNTER — Encounter (HOSPITAL_COMMUNITY): Payer: 59 | Attending: Nephrology

## 2011-03-31 DIAGNOSIS — N184 Chronic kidney disease, stage 4 (severe): Secondary | ICD-10-CM | POA: Insufficient documentation

## 2011-03-31 DIAGNOSIS — D638 Anemia in other chronic diseases classified elsewhere: Secondary | ICD-10-CM | POA: Insufficient documentation

## 2011-03-31 LAB — IRON AND TIBC
Iron: 62 ug/dL (ref 42–135)
Saturation Ratios: 24 % (ref 20–55)
TIBC: 259 ug/dL (ref 250–470)

## 2011-04-07 NOTE — Assessment & Plan Note (Signed)
OFFICE VISIT   April [name], April [name]  DOB:  February 26, 1956                                       10/09/2010  [id number]   CHIEF COMPLAINT:  Needs hemodialysis access.   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  Chronic medical problems also include hypertension  and hyperlipidemia which are currently controlled and followed by Dr.  Jimmy [name].  She also has a history of asthma.  She is right-handed.   MEDICATIONS:  Enalapril, hydrochlorothiazide, p.r.n. Tylenol, Hectorol,  Protonix, colchicine, sodium bicarbonate, and Nasonex.   REVIEW OF SYSTEMS:  She denies any skin itching, shortness of breath, or  chest pain currently.   Most recent serum creatinine was 4.32.   PHYSICAL EXAM:  Blood pressure 103/71 in the left arm, respirations 18,  heart rate 80 and regular.  HEENT is unremarkable.  Neck has 2+ carotid  pulses.  Upper extremities:  She has 2+ brachial and radial pulses  bilaterally.  She has obese upper arms bilaterally.  On placement of  tourniquet on the arm, the cephalic vein is palpable in its proximal  aspect.   She had a vein mapping ultrasound today which I reviewed and  interpreted.  The cephalic vein in the upper arm on the left side is  between 34 and 50 mm in diameter.  The vein is fairly small at the wrist  level and has branching.  The right upper extremity is similar with a  good upper arm cephalic vein but branching and smaller cephalic vein at  the wrist.  Of note, she does have a good quality basilic vein as well  bilaterally.   I believe the best option for the patient at this point would be  placement of left brachiocephalic AV fistula.  I did discuss with her  that we may need to do additional procedures if the vein is deep due to  her obese upper arms.  I also discussed with her the risks, benefits,  and possible complications of the procedure including nonmaturation of  the fistula, bleeding, infection.  She understands and agrees to proceed  as scheduled for October 21, 2010.     April Oto. Fields, MD  Electronically Signed   CEF/MEDQ  D:  10/09/2010  T:  10/10/2010  Job:  [id number]   cc:   April [name]. [place], M.D.

## 2011-04-07 NOTE — Procedures (Signed)
CEPHALIC VEIN MAPPING   INDICATION:  Preop for AV fistula placement per standing order.   HISTORY:  Chronic kidney disease stage 4.   EXAM:  The right cephalic vein is compressible with diameter measurements  ranging from 0.24 to 0.61 cm.   The right basilic vein is compressible with diameter measurements  ranging from 0.35 to 0.51 cm.   The left cephalic vein is compressible with diameter measurements  ranging from 0.28 to 0.51 cm.   The left basilic vein is compressible with diameter measurements ranging  from 0.43 to 0.49 cm.   See attached worksheet for all measurements.   IMPRESSION:  Patent bilateral cephalic and basilic veins with diameter  measurements as described above.   ___________________________________________  [name], MD   CH/MEDQ  D:  10/10/2010  T:  10/10/2010  Job:  [id number]

## 2011-04-07 NOTE — Assessment & Plan Note (Signed)
OFFICE VISIT   Sia, Tonilynn L  DOB:  09-27-1956                                       12/03/2010  [id number]   I saw the patient in the office today for followup after placement of  her left upper arm AV fistula.  She had a brachiocephalic fistula placed  on 10/21/2010 and comes in for a routine followup visit.  Overall she  has been doing quite well and has no uremic symptoms.  Specifically, she  denies any nausea, vomiting, fatigue, anorexia, shortness of breath or  palpitations.  She plans on going to [facility] for transplant evaluation in  the near future.   PHYSICAL EXAMINATION:  Blood pressure 98/64, heart rate is 68.  Her  fistula has an excellent thrill, and it appears to be enlarging nicely.  She has a palpable radial pulse.  Her incision has healed nicely.   Overall I am pleased with her progress.  I think this will provide  adequate access if and when she needs it.  We will be happy to see her  back at any time.     Judeth Cornfield. Scot Dock, M.D.  Electronically Signed   CSD/MEDQ  D:  12/03/2010  T:  12/04/2010  Job:  [id number]   cc:   Jeneen Rinks L. Deterding, M.D.

## 2011-04-10 NOTE — Discharge Summary (Signed)
[facility]  Patient:    April [name]                     MRN: [id number] Adm. Date:  [id number] Disc. Date: 10/06/99 Attending:  Edmonia Caprio Dictator:   Jerilynn Birkenhead, M.D. CC:         [facility]                           Discharge Summary  DISCHARGE DIAGNOSES:          1. Intrauterine pregnancy at 35-6/7ths weeks.                               2. Viral syndrome.                               3. Dehydration, resolved.                               4. Gestational diabetes mellitus, insulin-requiring.                               5. Chronic hypertension.                               6. Newly diagnosed polycystic kidney disease.  DISCHARGE MEDICATIONS:        1. Labetalol 100 mg p.o. q.d.                               2. Baby aspirin p.o. q.d.                               3. Prenatal vitamins, one p.o. b.i.d.  CONSULTATIONS:                None.  PROCEDURES:                   Abdominal ultrasound to rule out gallstones showed no evidence of stones or common bile duct dilatation.  Liver showed several hepatic cysts in both lobes.  The right kidney and left kidney had multiple large and small cysts noted.  There is no definite hydronephrosis noted.  ADMISSION HISTORY AND PHYSICAL:                     April [name] is a 55 year old gravida 7, para 1-0-5-1, who presented at 35-3/7ths weeks to maternity admissions with the complaint of nausea, vomiting and diarrhea since that morning.  Patient also complained of intestinal cramps.  Patient has a history of gestational diabetes and chronic hypertension.  Patients other risk factors for this pregnancy include advanced maternal age and a history of preterm labor, and a previous C-section.  Patient is also Rh negative.  PHYSICAL EXAMINATION:  VITAL SIGNS:                  Temperature of 99.7, blood pressure 125/84, heart  rate 122, fetal heart rate of 160  to 170, no contractions noted on monitor. Patient had one  beat of clonus on admission.  NECK:                         Slight lymphadenopathy.  CHEST:                        Clear to auscultation bilaterally.  HEART:                        Regular rate and rhythm without murmur.  ABDOMEN:                      Gravid, nontender.  No edema.  Her urine showed positive ketones of 80.  A CBG was 74.  Patient had already given herself her insulin that morning.  ASSESSMENT/PLAN:              It was decided to admit this patient with viral syndrome, dehydration, and gestational diabetes mellitus.  HOSPITAL COURSE: #1 - VIRAL SYNDROME:          Patient presented with maternal and fetal tachycardia.  She was started on D-5 lactated Ringers solution and continued on  that until her ketones were negative.  Patient was having a poor p.o. intake. Patients maternal and fetal heart rate gradually decreased to within normal limits by hospital day #3. At that time her heart rate was 74 and the babys heart rate  was 130s to 140s.  #2 - GESTATIONAL DIABETES:    Patients home dose of NPH was held during this admission.  This is because the patient was not eating very much and she was covered with sliding scale insulin instead.  Patient did not need any insulin during the course of her hospitalization.  Patient had been on terbutaline in the past and this was thought to increase the blood sugar slightly.  Patient instructed to take fasting blood sugars at home and bring in record of them.  Patient instructed not to take insulin until followup tomorrow on October 07, 1999.  #3 - POLYCYSTIC KIDNEY DISEASE:                      Patient does have a family history of a paternal grandmother and a father who has been diagnosed with polycystic kidney disease.  Her father died at a young age, at the age of 49, secondary to this problem. Patient reportedly never had an ultrasound in the past and this  ultrasound seems to confirm this diagnosis.  Patient does not need further workup during this pregnancy; however it is recommended she follow up with her primary care physician for possible nephrologist referral after she delivers the baby.  #4 - CHRONIC HYPERTENSION:    Patients blood pressure was under fairly good control during her stay here at the hospital.  She was continued on her home medicine of Labetalol 100 mg p.o. q.d.  #5 - PRETERM CONTRACTIONS:    The night that the patient was admitted she was noted to have several uterine contractions that were not relieved by subcu or p.o. terbutaline.  She was started on magnesium and this was continued for greater than 24 hours.  After coming off the magnesium the patient had an occasional contraction noted on the monitors.  Patient does not need to take her p.o. terbutaline when  discharged home.  FOLLOWUP:  With Dr. Hoy Morn for an appointment November 14, 000 and also for biophysical profile at that time.  DIET:                         Continue ADA diet.  CONDITION ON DISCHARGE:       Stable.  Discharged to home. DD:  10/06/99 TD:  10/06/99 Job: [id number]

## 2011-04-10 NOTE — Discharge Summary (Signed)
[facility]  Patient:    April [name]                     MRN: [id number] Adm. Date:  [id number] Disc. Date: 11/04/99 Attending:  Edmonia Caprio Dictator:   Charlcie Cradle, M.D.                           Discharge Summary  ADMISSION DIAGNOSES:          1. Intrauterine pregnancy at 39 weeks by seven-week                                  ultrasound.                               2. Gestational diabetes.                               3. Symmetric intrauterine growth retardation.                               4. Chronic hypertension.                               5. Polycystic kidney disease.                               6. Possible superimposed pre-eclampsia.  DISCHARGE DIAGNOSES:          1. Intrauterine pregnancy at 39 weeks by seven-week                                  ultrasound.                               2. Gestational diabetes.                               3. Symmetric intrauterine growth retardation.                               4. Chronic hypertension.                               5. Polycystic kidney disease.                               6. Possible superimposed pre-eclampsia.                               7. Status post vacuum-assisted vaginal delivery of a                                  viable female  infant.                               8. Status post bilateral tubal ligation.  DISCHARGE MEDICATIONS:        1. Labetalol 100 mg one p.o. q.d.                               2. Hydrochlorothiazide 25 mg q.d.                               3. Prenatal vitamins one q.d.                               4. Percocet 1-2 q.4-6h. p.r.n. pain.                               5. Ibuprofen 800 mg q.8h. p.r.n. pain.  BRIEF ADMISSION HISTORY AND PHYSICAL:                 This patient is a 55 year old [place], admitted P1-0-5-1 at 39 weeks by seven-week ultrasound who was admitted for induction of labor secondary to gestational diabetes,  chronic hypertension, polycystic kidney disease, symmetric IUGR, and probable superimposed pre-eclampsia.  Briefly, patients OB history is significant for two therapeutic abortions, a low transverse C-section in 1990 secondary to arrest of descent and two spontaneous abortions in [place]. During the current pregnancy patient had preterm labor at 18 weeks and had been on bed rest at home.  Patients past medical history is significant for the items noted above plus history of cervical dysplasia.  HOSPITAL COURSE:              Upon admission patient was having contractions at 6-8 minutes.  Fetal heart tones were reassuring and her cervix was 1 cm, closed and  high.  Patient was admitted, started on Prostaglandin gel for cervical ripening  secondary to previous C-section.  Patient continued on Prostaglandin gel x 2 days. On hospital day #2, low-dose Pitocin was started.  During the induction process, patients CBGs were monitored carefully and patient was put on an insulin protocol. Status post a one-day trial of Pitocin, patient showed no cervical change and so patient was started back on Prostaglandin gel late on hospital day #2.  Patient was restarted on low-dose Pitocin and on hospital day #4, Pitocin was continued x 24 hours and discontinued overnight.  Patient rested overnight on hospital day #4 nd on hospital day #5, Pitocin was restarted per protocol.  Patient began active labor on hospital day #5 and penicillin was started due to group B strep positive. Patient received an epidural for pain management and patient progressed quickly to complete.  Patient then wavered for approximately one-and-one-half hours more and then began pushing.  After a three-hour second stage, baby began having some variable decelerations; however had good recovery.  Patient continued pushing and at 8:52 on November 02, 1999, patient had a vacuum-assisted vaginal delivery over a second  degree midline episiotomy of a viable female infant with Apgars 8 and 9. Patient delivered spontaneously and the midline episiotomy was repaired with 3-0 Vicryl.  There were no complications for the delivery and mother and baby did very  well after postpartum.  On hospital day #6, patient was scheduled for bilateral  tubal ligation.   Please see dictated operative note.  Otherwise patient received routine postpartum care.  She was breast feeding without difficulty.  Patient had minimal blood loss and pain was well controlled with ibuprofen and Percocet. Patients blood sugars postpartum ranged 74 to 110 and patients blood pressure ranged from 140-188/84-94.  Patient was deemed ready for discharge on hospital ay #7, on postpartum day #2.  DISCHARGE MEDICATIONS:        Patient will be discharged home with labetalol and hydrochlorothiazide for control of blood pressure.  DIET:                         Patient was advised to remain on an advanced diabetic diet and to continue checking her sugars q.a.m. or q.h.s. and record these sugars until her followup visit.  FOLLOWUP:                     Patient will receive six-week postpartum care from Dr. Hoy Morn and patient will follow up with myself, Dr. Lavell Luster, at the family practice center in one-and-one-half weeks for followup for her hypertension, gestational diabetes, and polycystic kidney disease.  CONDITION ON DISCHARGE:       Good.DD:  11/04/99 TD:  11/04/99 Job: [id number]

## 2011-04-10 NOTE — Op Note (Signed)
NAME:  April [name], April [name]                       ACCOUNT NO.:  [id number]   MEDICAL RECORD NO.:  [id number]                   PATIENT TYPE:  AMB   LOCATION:  DSC                                  FACILITY:  [facility]   PHYSICIAN:  Daryl Eastern, M.D.      DATE OF BIRTH:  09/25/1956   DATE OF PROCEDURE:  10/17/2003  DATE OF DISCHARGE:                                 OPERATIVE REPORT   PREOPERATIVE DIAGNOSIS:  Left carpal tunnel syndrome.   POSTOPERATIVE DIAGNOSIS:  Left carpal tunnel syndrome.   PROCEDURE:  Decompression median nerve, left carpal tunnel.   SURGEON:  Daryl Eastern, M.D.   ANESTHESIA:  0.5% Marcaine local with sedation.   OPERATIVE FINDINGS:  The patient had no masses in the carpal canal.  The  motor branch of the nerve was intact.   PROCEDURE:  Under 0.5% Marcaine local anesthesia with the tourniquet on the  left arm, the left hand was prepped and draped in the usual fashion and  after exsanguinating the limb, the tourniquet was inflated to 250 mmHg.  A 3  cm longitudinal incision was made in the palm just ulnar to the thenar  crease.  Sharp dissection was carried through the subcutaneous tissue and  then bleeding points were coagulated.  Blunt dissection was carried through  the superficial palmar fascia, distal to the transverse carpal ligament.  A  hemostat was placed in the carpal canal up against the hook of the hamate  and the transverse carpal ligament was divided on the ulnar border of the  median nerve.  The proximal end of the ligament was divided with the  scissors after dissecting the nerve away from the undersurface of the  ligament.  The carpal canal was then palpated and was found to be adequately  decompressed.  The nerve was examined and the motor branch identified.  The  wound was then irrigated with saline.  The skin was closed with 4-0 nylon  suture.  Sterile dressings were applied followed by a volar wrist splint.  The patient  tolerated the procedure well and went to the recovery room awake  and in stable and good condition.                                               Daryl Eastern, M.D.    EMM/MEDQ  D:  10/17/2003  T:  10/18/2003  Job:  [id number]   cc:   Ardeen Jourdain, M.D.  [place]  [place] [address]  Fax: [phone]

## 2011-04-10 NOTE — H&P (Signed)
NAME:  April [name], April [name]                       ACCOUNT NO.:  [id number]   MEDICAL RECORD NO.:  [id number]                   PATIENT TYPE:  INP   LOCATION:  5033                                 FACILITY:  [place] [facility]   PHYSICIAN:  Royetta Crochet. Karlton Lemon, M.D.           DATE OF BIRTH:  Jul 28, 1956   DATE OF ADMISSION:  10/27/2002  DATE OF DISCHARGE:                                HISTORY & PHYSICAL   CHIEF COMPLAINT:  1. Shortness of breath.  2. Pneumonia.   HISTORY OF PRESENT ILLNESS:  The patient is a 55 year old lady who has had a  two to four-week history of progressively worsening shortness of breath.  She has been seen in the urgent care office at least three times and given  outpatient antibiotic regimen, including Avalox, Augmentin, and Tequin.  She  also has been seen several times by her primary care physician for asthmatic  bronchitis symptoms.  She was most recently seen in the emergency department  several days ago and was started on Biaxin for pneumonia.  However, over the  last several days, her breathing has worsened.  She denies shortness of  breath and chest pain, except with deep breathing.  She has no other acute  constitutional or systemic complaints, including GI/GU symptoms, at this  time.   ALLERGIES:  ERYTHROMYCIN.   PAST MEDICAL HISTORY:  1. Hypertension.  2. Polycystic kidney disease.   MEDICATIONS:  1. Enalapril 10 mg p.o. q.d.  2. HCTZ 25 mg q.d.  3. Celexa 20 mg p.o. q.d.  4. Biaxin XL 1000 mg q.d.   FAMILY HISTORY:  Significant for hypertension in mother.   SOCIAL HISTORY:  The patient is married and is employed as a Education officer, [employer].  She denies tobacco, alcohol, or drugs of abuse.   REVIEW OF SYMPTOMS:  As per HPI.   PHYSICAL EXAMINATION:  GENERAL APPEARANCE:  A well-developed, well-  nourished, pale-appearing, white female.  VITAL SIGNS:  Temperature 97.9 degrees, heart rate 119, respirations 16,  blood pressure 122/67.  HEENT:  TMs within normal  limits bilaterally.  No oropharyngeal lesions.  NECK:  Supple.  No masses.  Carotids 2+.  No bruits.  LUNGS:  Poor air movement.  Decreased breath sounds in the bases  bilaterally.  HEART:  S1 and S2.  Regular rate and rhythm.  No murmurs, rubs, or gallops.  ABDOMEN:  Soft, nontender, and nondistended.  Positive bowel sounds.  EXTREMITIES:  No cyanosis, clubbing, or edema.  NEUROLOGIC:  Alert and oriented x 3.  Cranial nerves intact.   ASSESSMENT AND PLAN:  1. Pneumonia with shortness of breath.  Since the patient has failed     outpatient therapy, she will need admission for further treatment of     pneumonia.  She will be given IV medications with albuterol nebulizers.     She may require steroid treatment for an asthmatic component of her     shortness of breath.  2. Hypertension, well controlled.  She will continue current medications as     directed.                                               Royetta Crochet. Karlton Lemon, M.D.    KRS/MEDQ  D:  10/28/2002  T:  10/28/2002  Job:  [id number]

## 2011-04-10 NOTE — Discharge Summary (Signed)
NAME:  [name], April [name]                       ACCOUNT NO.:  [id number]   MEDICAL RECORD NO.:  [id number]                   PATIENT TYPE:  INP   LOCATION:  5033                                 FACILITY:  [facility]   PHYSICIAN:  Royetta Crochet. Karlton Lemon, M.D.           DATE OF BIRTH:  January 29, 1956   DATE OF ADMISSION:  10/27/2002  DATE OF DISCHARGE:  10/31/2002                                 DISCHARGE SUMMARY   DISCHARGE DIAGNOSES:  1. Pneumonia.  2. Asthmatic bronchitis.  3. Hypertension.  4. Abnormal liver function tests.  5. Anemia.  6. Polycystic kidney disease.   HOSPITAL COURSE:  The patient presented with a three-week history of  progressively worsening shortness of breath after being evaluated on several  occasions at the urgent care and emergency department.  Initial chest x-ray  showed bibasilar air space disease, left greater than right, and atelectasis  versus infiltrate with small effusions.  Her chest x-ray had not changed  significantly with several outpatient regimens of antibiotics.  During the  course of this admission, she was placed on albuterol nebulizer as well as  IV antibiotics.  Her symptoms did improve with this regimen as well as with  the addition of IV Solu-Medrol for treatment of asthmatic bronchitis.  Upon  discharge, she was breathing at 92% on room air with markedly improved air  movement.  During the course of this admission she denied chest pain or any  other acute concerns.  The patient's other chronic medical problems were  stable during this admission.  She did have elevated liver function tests  which she states is not a new problem for her.  This has been evaluated per  Dr. Jimmy Footman.  Acute hepatitis serologies during this admission were  negative.  She states that her abnormal liver function tests have been  attributed to her polycystic kidney disease.  She was noted to have a mild  anemia with a hemoglobin of 10.2.  No further anemia work-up was  pursued  during this admission.   DISCHARGE MEDICATIONS:  1. Enalapril 10 mg p.o. q.d.  2. Hydrochlorothiazide 25 mg p.o. q.d.  3. Celexa 20 mg p.o. q.d.  4. Zithromax 250 mg one p.o. q.d. for seven days.  5. Albuterol nebulizer q.4h.  6. Atrovent nebulizer q.4h.  7. Prednisone 12 day taper.  8. Humibid LA one p.o. b.i.d.  9. Endal HD one teaspoon p.o. q.6h. p.r.n. for cough.  10.      Claritin 10 mg one p.o. q.d.   FOLLOW UP:  The patient will be seen by Royetta Crochet. Karlton Lemon, M.D., on  Monday, November 06, 2002, at 9 a.m. for follow-up.                                               Royetta Crochet.  Karlton Lemon, M.D.   KRS/MEDQ  D:  10/31/2002  T:  10/31/2002  Job:  [id number]

## 2011-04-13 ENCOUNTER — Ambulatory Visit
Admission: RE | Admit: 2011-04-13 | Discharge: 2011-04-13 | Disposition: A | Discharge status: Home / Self Care | Payer: 59 | Source: Ambulatory Visit | Attending: Family Medicine | Admitting: Family Medicine

## 2011-04-13 ENCOUNTER — Other Ambulatory Visit: Payer: Self-pay | Admitting: Family Medicine

## 2011-04-13 DIAGNOSIS — R109 Unspecified abdominal pain: Secondary | ICD-10-CM

## 2011-04-13 DIAGNOSIS — R1011 Right upper quadrant pain: Secondary | ICD-10-CM

## 2011-04-13 DIAGNOSIS — R10A1 Flank pain, right side: Secondary | ICD-10-CM

## 2011-04-14 ENCOUNTER — Encounter (HOSPITAL_COMMUNITY): Payer: 59

## 2011-04-14 ENCOUNTER — Other Ambulatory Visit: Payer: Self-pay | Admitting: Family Medicine

## 2011-04-14 ENCOUNTER — Other Ambulatory Visit: Payer: Self-pay | Admitting: Nephrology

## 2011-04-14 DIAGNOSIS — N838 Other noninflammatory disorders of ovary, fallopian tube and broad ligament: Secondary | ICD-10-CM

## 2011-04-16 ENCOUNTER — Ambulatory Visit
Admission: RE | Admit: 2011-04-16 | Discharge: 2011-04-16 | Disposition: A | Discharge status: Home / Self Care | Payer: 59 | Source: Ambulatory Visit | Attending: Family Medicine | Admitting: Family Medicine

## 2011-04-16 DIAGNOSIS — N838 Other noninflammatory disorders of ovary, fallopian tube and broad ligament: Secondary | ICD-10-CM

## 2011-04-22 ENCOUNTER — Ambulatory Visit: Payer: 59 | Attending: Gynecologic Oncology | Admitting: Gynecologic Oncology

## 2011-04-22 DIAGNOSIS — M109 Gout, unspecified: Secondary | ICD-10-CM | POA: Insufficient documentation

## 2011-04-22 DIAGNOSIS — K7689 Other specified diseases of liver: Secondary | ICD-10-CM | POA: Insufficient documentation

## 2011-04-22 DIAGNOSIS — D259 Leiomyoma of uterus, unspecified: Secondary | ICD-10-CM | POA: Insufficient documentation

## 2011-04-22 DIAGNOSIS — IMO0001 Reserved for inherently not codable concepts without codable children: Secondary | ICD-10-CM | POA: Insufficient documentation

## 2011-04-22 DIAGNOSIS — K429 Umbilical hernia without obstruction or gangrene: Secondary | ICD-10-CM | POA: Insufficient documentation

## 2011-04-22 DIAGNOSIS — Z79899 Other long term (current) drug therapy: Secondary | ICD-10-CM | POA: Insufficient documentation

## 2011-04-22 DIAGNOSIS — N189 Chronic kidney disease, unspecified: Secondary | ICD-10-CM | POA: Insufficient documentation

## 2011-04-22 DIAGNOSIS — M810 Age-related osteoporosis without current pathological fracture: Secondary | ICD-10-CM | POA: Insufficient documentation

## 2011-04-22 DIAGNOSIS — N839 Noninflammatory disorder of ovary, fallopian tube and broad ligament, unspecified: Secondary | ICD-10-CM | POA: Insufficient documentation

## 2011-04-22 DIAGNOSIS — I129 Hypertensive chronic kidney disease with stage 1 through stage 4 chronic kidney disease, or unspecified chronic kidney disease: Secondary | ICD-10-CM | POA: Insufficient documentation

## 2011-04-22 DIAGNOSIS — J45909 Unspecified asthma, uncomplicated: Secondary | ICD-10-CM | POA: Insufficient documentation

## 2011-04-22 DIAGNOSIS — Q613 Polycystic kidney, unspecified: Secondary | ICD-10-CM | POA: Insufficient documentation

## 2011-04-22 DIAGNOSIS — I7300 Raynaud's syndrome without gangrene: Secondary | ICD-10-CM | POA: Insufficient documentation

## 2011-04-22 DIAGNOSIS — K219 Gastro-esophageal reflux disease without esophagitis: Secondary | ICD-10-CM | POA: Insufficient documentation

## 2011-04-22 DIAGNOSIS — Z87891 Personal history of nicotine dependence: Secondary | ICD-10-CM | POA: Insufficient documentation

## 2011-04-23 NOTE — Consult Note (Signed)
NAME:  April [name], April [name]             ACCOUNT NO.:  [id number]  MEDICAL RECORD NO.:  [id number]           PATIENT TYPE:  O  LOCATION:  GYN                          FACILITY:  [facility]  PHYSICIAN:  Teren Franckowiak A. Alycia Rossetti, MD    DATE OF BIRTH:  08-09-1956  DATE OF CONSULTATION:  04/22/2011 DATE OF DISCHARGE:                                CONSULTATION   REFERRING PHYSICIAN:  Leitha Bleak, M.D.  HISTORY OF PRESENT ILLNESS:  The patient is seen today in consultation at request of Dr. Addison Lank.  April [name] is a very pleasant 55 year old gravida 7, para 2 who had some abdominal pain and was evaluated with a CT scan of the abdomen and pelvis on Apr 13, 2011.  It revealed a new 4.3-cm mass in the right ovary when compared to a CT scan from 2 years ago.  There are numerous cysts in the liver and both kidneys consistent with polycystic disease which she is known for.  Many of the cysts in the kidneys were hyperdense consistent with proteinaceous cyst.  There were no worrisome lesions in the kidneys.  There was no adenopathy. There was no ascites.  She did have a small periumbilical hernia containing only fat.  The impression was a new 4.3 cm lesion on the right ovary.  Recommended pelvic ultrasound.  She subsequently underwent a pelvic ultrasound that revealed a uterus that 7.2 x 3.7 x 6.6 cm in size and retroverted.  There were several small fibroids noted.  She had a normal thickness in the endometrium of 2.5 mm.  Within the right ovary, she had a 4.2 x 3.2 x 3.7 cm mass with solid appearing mixed echogenic lesion associated with that ovary.  The left ovary measured 1.9 x 1.2 x 1.6 cm and was otherwise normal.  There was no free fluid. CA-125 was performed and was 18.  For this reason, she comes in to see [place].  She initially saw Dr. Addison Lank with symptoms consistent with UTI. She was treated and her abdominal pain had not really resolved and subsequently worked up with CT and ultrasound as described  above persisted.  The pain has now decreased.  Occasionally she will have some crampy pain like a period.  She denies any change in bowel or bladder habits.  She denies any vaginal bleeding.  She has been menopausal since the age of 30.  Her last delivery was at the age of 45.  She was breast- feeding for a couple of years and really has not had regular periods since that time.  She did take steroids for a medical issue, had a heavy period after that.  She has really not had regular cycles.  She does occasionally have some nausea which she attributes to her kidneys but that has not changed.  She is not eating as much as her appetite is somewhat decreased but she really would not necessarily call it early satiety.  PAST MEDICAL HISTORY:  Polycystic kidney disease, anemia, hypertension, chronic renal insufficiency with creatinine of 5 and under the care of Dr. Jimmy Footman, gastroesophageal reflux disease, fibromyalgia, asthma, gout, obesity, Raynaud's, allergic rhinitis with dysfunctional  eustachian tubes, osteoporosis, diverticulosis.  MEDICATIONS: 1. Sodium bicarb tablets 2 twice daily. 2. Lorazepam 4 mg 1/2 to 1 tablet q.h.s. p.r.n. 3. Albuterol inhaler 2 puffs every 6 hours. 4. Omeprazole 40 mg daily. 5. Enalapril 10 mg daily. 6. Advair 2 puffs twice a day. 7. Allopurinol 100 mg daily. 8. Citalopram 10 mg 1/2 tablet daily. 9. Nasonex 50 mcg 2 sprays via each nostril daily. 10.Allegra 30 mg daily.  ALLERGIES:  ERYTHROMYCIN makes her mouth breakout, OXYCODONE causes nausea and vomiting.  PAST SURGICAL HISTORY:  Cesarean section x1, tubal ligation, fistula in her left arm.  SOCIAL HISTORY:  She quit smoking 23 years ago.  She drinks alcohol rarely.  She is married.  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  She also works part-time at [employer].  Her husband is a English as a second language teacher.  PAST GYN HISTORY:  She has a gravida 7, para 2.  She has an  45 year old son and a 36 year old daughter.  HEALTH MAINTENANCE:  She is up-to-date on her mammogram.  She had a colonoscopy in 2010.  PHYSICAL EXAMINATION:  VITAL SIGNS:  Height 5 feet 3, weight 198 pounds for BMI of 35.  Blood pressure 100/70, pulse 64, respirations 16, temperature 97.8. GENERAL:  Well-nourished, well-developed female, in no acute distress. NECK:  Supple.  There is no lymphadenopathy, no thyromegaly. LUNGS:  Clear to auscultation bilaterally. CARDIOVASCULAR EXAM:  Regular rate and rhythm. ABDOMEN:  She has well-healed surgical incisions.  She has a reducible umbilical hernia that measures approximately 2 cm. In the right upper quadrant, her kidney is palpable.  It is more prominent than on the left.  Groins are negative for adenopathy. EXTREMITIES:  There is no edema. PELVIC:  External genitalia within normal limits.  Bimanual examination, the cervix is palpably normal; corpus of normal size, shape, consistency.  I cannot appreciate any adnexal masses.  ASSESSMENT:  This is a 55 year old with polycystic kidney disease whose baseline creatinine is 5.  She is also on Procrit.  She has been seen at [facility] for consideration of kidney transplant but is not on the kidney transplant list yet.  She did have a fistula put in for dialysis purposes but has not required dialysis at this time.  She does have a solid-appearing mass though I do not believe this is a malignancy, does need surgical removal and evaluation independent of the need for transplant.  We discussed performing laparoscopic bilateral salpingo- oophorectomy.  The right ovary will be removed first, it will be taken for frozen section.  If that is benign, we will proceed with removal of the left side hernia repair.  If there is a malignant process, she understands that she will need more extensive surgery.  She would like to keep the surgery as simple as possible secondary to not having to be under anesthesia  for prolonged period of time and to decrease the risk of injuring to her kidney.  Risks and benefits of surgery including bleeding, injury to surrounding organs clearing including the ureters were discussed with the patient.  She wishes to proceed. We tentatively have scheduled surgery for June 02, 2011.  She will contact us if she has any questions prior to that time.  There are no medications that she is on that need to be discontinued.  Standard DVT prophylaxis will be employed.     Lynna Zamorano A. Alycia Rossetti, MD     PAG/MEDQ  D:  04/22/2011  T:  04/22/2011  Job:  [id number]  cc:   Leitha Bleak, M.D. Fax: [place]. Deterding, M.D. Fax: [phone]  Caswell Corwin, R.N. [address], [place] [address]  Electronically Signed by Nancy Marus MD on 04/23/2011 09:06:17 AM

## 2011-04-28 ENCOUNTER — Other Ambulatory Visit: Payer: Self-pay | Admitting: Nephrology

## 2011-04-28 ENCOUNTER — Encounter (HOSPITAL_COMMUNITY): Payer: 59 | Attending: Nephrology

## 2011-04-28 DIAGNOSIS — N184 Chronic kidney disease, stage 4 (severe): Secondary | ICD-10-CM | POA: Insufficient documentation

## 2011-04-28 DIAGNOSIS — D638 Anemia in other chronic diseases classified elsewhere: Secondary | ICD-10-CM | POA: Insufficient documentation

## 2011-04-28 LAB — IRON AND TIBC
Iron: 58 ug/dL (ref 42–135)
TIBC: 260 ug/dL (ref 250–470)

## 2011-05-07 ENCOUNTER — Encounter (HOSPITAL_COMMUNITY): Payer: 59

## 2011-05-12 ENCOUNTER — Encounter (HOSPITAL_COMMUNITY): Payer: 59

## 2011-05-12 ENCOUNTER — Other Ambulatory Visit: Payer: Self-pay | Admitting: Nephrology

## 2011-05-26 ENCOUNTER — Encounter (HOSPITAL_COMMUNITY): Payer: 59

## 2011-05-29 ENCOUNTER — Other Ambulatory Visit: Payer: Self-pay | Admitting: Gynecologic Oncology

## 2011-05-29 ENCOUNTER — Encounter (HOSPITAL_COMMUNITY): Payer: 59

## 2011-05-29 LAB — CBC
HCT: 38.5 % (ref 36.0–46.0)
Hemoglobin: 12.3 g/dL (ref 12.0–15.0)
MCV: 97.7 fL (ref 78.0–100.0)
RBC: 3.94 MIL/uL (ref 3.87–5.11)
WBC: 5.4 10*3/uL (ref 4.0–10.5)

## 2011-05-29 LAB — COMPREHENSIVE METABOLIC PANEL
ALT: 21 U/L (ref 0–35)
AST: 22 U/L (ref 0–37)
Alkaline Phosphatase: 93 U/L (ref 39–117)
CO2: 24 mEq/L (ref 19–32)
Calcium: 10.2 mg/dL (ref 8.4–10.5)
GFR calc Af Amer: 10 mL/min — ABNORMAL LOW (ref >60)
GFR calc non Af Amer: 9 mL/min — ABNORMAL LOW (ref >60)
Glucose, Bld: 80 mg/dL (ref 70–99)
Potassium: 5.4 mEq/L — ABNORMAL HIGH (ref 3.5–5.1)
Sodium: 138 mEq/L (ref 135–145)
Total Protein: 7.8 g/dL (ref 6.0–8.3)

## 2011-05-29 LAB — SURGICAL PCR SCREEN: Staphylococcus aureus: POSITIVE — AB

## 2011-05-29 LAB — DIFFERENTIAL
Basophils Absolute: 0 10*3/uL (ref 0.0–0.1)
Lymphocytes Relative: 25 % (ref 12–46)
Lymphs Abs: 1.3 10*3/uL (ref 0.7–4.0)
Neutro Abs: 3.6 10*3/uL (ref 1.7–7.7)
Neutrophils Relative %: 66 % (ref 43–77)

## 2011-06-01 ENCOUNTER — Encounter (HOSPITAL_COMMUNITY): Payer: 59 | Attending: Nephrology

## 2011-06-01 ENCOUNTER — Other Ambulatory Visit: Payer: Self-pay | Admitting: Nephrology

## 2011-06-01 DIAGNOSIS — D638 Anemia in other chronic diseases classified elsewhere: Secondary | ICD-10-CM | POA: Insufficient documentation

## 2011-06-01 DIAGNOSIS — N184 Chronic kidney disease, stage 4 (severe): Secondary | ICD-10-CM | POA: Insufficient documentation

## 2011-06-02 ENCOUNTER — Other Ambulatory Visit: Payer: Self-pay | Admitting: Gynecologic Oncology

## 2011-06-02 ENCOUNTER — Ambulatory Visit (HOSPITAL_COMMUNITY)
Admission: RE | Admit: 2011-06-02 | Discharge: 2011-06-02 | Disposition: A | Discharge status: Home / Self Care | Payer: 59 | Source: Ambulatory Visit | Attending: Obstetrics & Gynecology | Admitting: Obstetrics & Gynecology

## 2011-06-02 DIAGNOSIS — K429 Umbilical hernia without obstruction or gangrene: Secondary | ICD-10-CM | POA: Insufficient documentation

## 2011-06-02 DIAGNOSIS — K219 Gastro-esophageal reflux disease without esophagitis: Secondary | ICD-10-CM | POA: Insufficient documentation

## 2011-06-02 DIAGNOSIS — Q613 Polycystic kidney, unspecified: Secondary | ICD-10-CM | POA: Insufficient documentation

## 2011-06-02 DIAGNOSIS — N184 Chronic kidney disease, stage 4 (severe): Secondary | ICD-10-CM | POA: Insufficient documentation

## 2011-06-02 DIAGNOSIS — J45909 Unspecified asthma, uncomplicated: Secondary | ICD-10-CM | POA: Insufficient documentation

## 2011-06-02 DIAGNOSIS — D279 Benign neoplasm of unspecified ovary: Secondary | ICD-10-CM | POA: Insufficient documentation

## 2011-06-02 DIAGNOSIS — Z01812 Encounter for preprocedural laboratory examination: Secondary | ICD-10-CM | POA: Insufficient documentation

## 2011-06-02 LAB — SAMPLE TO BLOOD BANK

## 2011-06-08 NOTE — Op Note (Signed)
NAMELATREECE, PHILIBERT             ACCOUNT NO.:  [id number]  MEDICAL RECORD NO.:  [id number]  LOCATION:  DAYL                         FACILITY:  [facility]  PHYSICIAN:  Maydelin Deming A. Alycia Rossetti, MD    DATE OF BIRTH:  [birth year]  DATE OF PROCEDURE:  06/02/2011 DATE OF DISCHARGE:                              OPERATIVE REPORT   PREOPERATIVE DIAGNOSIS:  Right-sided ovarian mass.  POSTOPERATIVE DIAGNOSIS:  Benign sex cord stromal tumor, favor fibroma or fibrothecoma.  PROCEDURE:  Laparoscopic bilateral salpingo-oophorectomy, hernia repair.  SURGEONS:  Mackinley Cassaday A. Alycia Rossetti, MD and Agnes Lawrence, M.D.  ASSISTANT:  Carlye Grippe  ANESTHESIA:  General.  BLOOD LOSS:  25 mL.  IV FLUIDS:  900 mL.  URINE OUTPUT:  100 mL.  SPECIMENS:  Bilateral tubes and ovaries sent to pathology.  COMPLICATIONS:  None.  OPERATIVE FINDINGS:  Included a 1-cm umbilical hernia with omentum within it.  There is a 4-cm solid appearing right ovarian mass.  A normal-appearing left ovarian mass.  Frozen section revealed an ovarian fibroma.  DESCRIPTION OF PROCEDURE:  The patient was identified in the preop holding area as self.  Informed consent was signed on the chart.  The patient's questions and answers were elicited and answered to her satisfaction.  Risks and benefits of the procedure were discussed with the patient.  She wanted to proceed with a bilateral salpingo- oophorectomy as to avoid an additional issue with the left ovary in the future even though I told her that there was less than a 5% risk.  She was then taken to the operating room, placed in position.  Arms were tucked at her sides while she was awake with appropriate precautions. General anesthesia was then induced.  She was then placed in dorsal lithotomy position with appropriate precautions with SCDs.  Shoulder blocks were placed in the usual fashion.  The abdomen was prepped in usual fashion.  Time-out was performed to confirm the patient,  the procedure, antibiotic and allergy status.  The perineum was then prepped in usual fashion.  Foley catheter was inserted in the bladder under sterile condition.  Sterile speculum was placed in the vagina.  The cervix was grasped with single-tooth tenaculum, dilated without difficulty and a ZUMI uterine manipulator was inserted.  After waiting 3 minutes dry time, the umbilicus was anesthetized with 0.25% Marcaine.  A 5 mm incision was made and with 5 mm Optiview, we entered the abdominal cavity.  Abdomen was insufflated with CO2 gas.  At this point and all points during the patient's procedure, the intra- abdominal pressure did not increase over 50 mmHg.  She was then placed in Trendelenburg position.  Due to visualization with a 5 mm scope, we opted to change to a 10/12.  Therefore a lateral 5-mm port was placed under direct visualization 2 cm above and medial to the ACIS.  Looking up at the port site, the 10/12 port was placed without difficulty.  The adhesive disease of the omentum to the umbilicus was taken down using monopolar cautery.  A right-sided 5 mm port and 10/12 suprapubic port was placed in the usual fashion under direct visualization.  The small bowel was folded out of the surgical field.  A Ray-Tec was used to provide suction for the small bowel to stay out of the surgical field.  Our attention was first drawn to the right side.  The posterior leaves of broad ligament were opened monopolar cautery.  The ureter was identified.  A window was made between the IP and the ureter.  The IP was coagulated with bipolar cautery using a Kleppinger and then transected.  The uteroovarian pedicle and the tube were also coagulated with bipolar cautery and transected.  The right tube and ovary were placed in EndoCatch bag and delivered to the suprapubic port.  A similar procedure was performed on the patient's left side.  Frozen section resistant returned as an ovarian fibroma.  The  IP pedicles were identified and noted to be hemostatic.  The ureters were noted to be coursing inferior to the IP.  CO2 gas was removed from the patient's abdomen.  The fascia of the umbilicus was grasped with Kocher clamps.  The umbilical hernia was repaired using figure-of-eight suture of 0 Vicryl on a UR-6.  The fascial incision of the suprapubic port was similarly closed using 0 Vicryl on a UR-6.  Skin was closed using 4-0 Vicryl. Steri-Strips were placed.  The ZUMI was removed as was the Foley catheter.  All instrument, Ray-Tec and needle counts were correct x2.  The patient tolerated the procedure well, in stable condition.  She was taken to recovery room in stable condition.  There were no complications.     Aalaysia Liggins A. Alycia Rossetti, MD     PAG/MEDQ  D:  06/02/2011  T:  06/02/2011  Job:  [id number]  cc:   Caswell Corwin, R.N. [address] [place], [place] [address]  Agnes Lawrence, M.D. Fax: [phone]  Leitha Bleak, M.D. Fax: [place]. Deterding, M.D. [phone]  Electronically Signed by Nancy Marus MD on 06/08/2011 12:46:21 PM

## 2011-06-15 ENCOUNTER — Encounter (HOSPITAL_COMMUNITY): Payer: 59

## 2011-06-15 ENCOUNTER — Other Ambulatory Visit: Payer: Self-pay | Admitting: Nephrology

## 2011-06-16 LAB — POCT HEMOGLOBIN-HEMACUE: Hemoglobin: 11.9 g/dL — ABNORMAL LOW (ref 12.0–15.0)

## 2011-06-17 ENCOUNTER — Ambulatory Visit: Payer: 59 | Attending: Gynecologic Oncology | Admitting: Gynecologic Oncology

## 2011-06-17 DIAGNOSIS — D259 Leiomyoma of uterus, unspecified: Secondary | ICD-10-CM | POA: Insufficient documentation

## 2011-06-17 DIAGNOSIS — N289 Disorder of kidney and ureter, unspecified: Secondary | ICD-10-CM | POA: Insufficient documentation

## 2011-06-17 DIAGNOSIS — Z09 Encounter for follow-up examination after completed treatment for conditions other than malignant neoplasm: Secondary | ICD-10-CM | POA: Insufficient documentation

## 2011-06-17 DIAGNOSIS — Z9079 Acquired absence of other genital organ(s): Secondary | ICD-10-CM | POA: Insufficient documentation

## 2011-06-18 NOTE — Consult Note (Signed)
  April [name], [name]             ACCOUNT NO.:  [id number]  MEDICAL RECORD NO.:  [id number]  LOCATION:  GYN                          FACILITY:  [facility]  PHYSICIAN:  Janie Morning, MD     DATE OF BIRTH:  [birth year]  DATE OF CONSULTATION:  06/17/2011 DATE OF DISCHARGE:                                CONSULTATION   REASON FOR VISIT:  Postoperative check status post bilateral salpingo- oophorectomy for benign ovarian tumor.  HISTORY OF PRESENT ILLNESS:  This is a very lovely 55 year old with underlying renal disease for which an ultrasound noted uterus retroverted with small fibroids and bilateral right adnexal abnormalities.  There was no free fluid and CA-125 was 18.  On June 02, 2011, she underwent laparoscopic bilateral salpingo-oophorectomy.  Final pathology demonstrated right benign ovarian fibroma and unremarkable left ovary.  Postoperatively, April [name] has done well and has no complaints.  PAST MEDICAL HISTORY:  No interval changes.  PAST SURGICAL HISTORY:  Interim laparoscopic bilateral salpingo- oophorectomy.  REVIEW OF SYSTEMS:  No nausea, vomiting, fever, chills.  No vaginal bleeding.  No flank pain.  Otherwise, 10-point review of systems negative.  PHYSICAL EXAMINATION:  GENERAL:  Well-developed female, in no acute distress. VITAL SIGNS:  Weight 196 pounds, blood pressure 118/62, pulse of 72, respiratory rate 18, temperature 98.1. BACK:  No CVA tenderness. CHEST:  Clear to auscultation. ABDOMEN:  Soft, nontender.  Laparoscopic site is well healed.  Umbilical repair is intact.  There is no tenderness or erythema around the periumbilical incision. PELVIC EXAMINATION:  No cul-de-sac fullness.  IMPRESSION:  Status post laparoscopic bilateral salpingo-oophorectomy with umbilical hernia repair.  April [name] is doing well.  I have advised her to follow up with Dr. Addison Lank for her regular gynecologic evaluations.     Janie Morning, MD     WB/MEDQ  D:   06/17/2011  T:  06/17/2011  Job:  [id number]  cc:   Caswell Corwin, R.N. [address] [place], [place] [address]  Leitha Bleak, M.D. Fax: [phone]  Electronically Signed by Janie Morning MD on 06/18/2011 10:59:26 AM

## 2011-06-30 ENCOUNTER — Encounter (HOSPITAL_COMMUNITY): Payer: 59 | Attending: Nephrology

## 2011-06-30 ENCOUNTER — Other Ambulatory Visit: Payer: Self-pay | Admitting: Nephrology

## 2011-06-30 DIAGNOSIS — N184 Chronic kidney disease, stage 4 (severe): Secondary | ICD-10-CM | POA: Insufficient documentation

## 2011-06-30 DIAGNOSIS — D638 Anemia in other chronic diseases classified elsewhere: Secondary | ICD-10-CM | POA: Insufficient documentation

## 2011-07-14 ENCOUNTER — Other Ambulatory Visit: Payer: Self-pay | Admitting: Nephrology

## 2011-07-14 ENCOUNTER — Encounter (HOSPITAL_COMMUNITY): Payer: 59

## 2011-07-14 LAB — POCT HEMOGLOBIN-HEMACUE: Hemoglobin: 11.5 g/dL — ABNORMAL LOW (ref 12.0–15.0)

## 2011-07-28 ENCOUNTER — Encounter (HOSPITAL_COMMUNITY): Payer: 59

## 2011-07-28 ENCOUNTER — Other Ambulatory Visit: Payer: Self-pay | Admitting: Nephrology

## 2011-07-28 ENCOUNTER — Encounter (HOSPITAL_COMMUNITY): Payer: 59 | Attending: Nephrology

## 2011-07-28 DIAGNOSIS — D638 Anemia in other chronic diseases classified elsewhere: Secondary | ICD-10-CM | POA: Insufficient documentation

## 2011-07-28 DIAGNOSIS — N184 Chronic kidney disease, stage 4 (severe): Secondary | ICD-10-CM | POA: Insufficient documentation

## 2011-07-28 LAB — RENAL FUNCTION PANEL
Albumin: 3.7 g/dL (ref 3.5–5.2)
CO2: 26 mEq/L (ref 19–32)
Calcium: 10.8 mg/dL — ABNORMAL HIGH (ref 8.4–10.5)
Creatinine, Ser: 5.86 mg/dL — ABNORMAL HIGH (ref 0.50–1.10)
GFR calc Af Amer: 9 mL/min — ABNORMAL LOW (ref >60)
GFR calc non Af Amer: 8 mL/min — ABNORMAL LOW (ref >60)
Sodium: 143 mEq/L (ref 135–145)

## 2011-07-28 LAB — FERRITIN: Ferritin: 481 ng/mL — ABNORMAL HIGH (ref 10–291)

## 2011-07-28 LAB — IRON AND TIBC: TIBC: 251 ug/dL (ref 250–470)

## 2011-08-11 ENCOUNTER — Encounter (HOSPITAL_COMMUNITY): Payer: 59

## 2011-08-11 ENCOUNTER — Other Ambulatory Visit: Payer: Self-pay | Admitting: Nephrology

## 2011-08-11 LAB — POCT HEMOGLOBIN-HEMACUE: Hemoglobin: 11.9 g/dL — ABNORMAL LOW (ref 12.0–15.0)

## 2011-08-25 ENCOUNTER — Encounter (HOSPITAL_COMMUNITY): Payer: 59

## 2011-09-01 ENCOUNTER — Encounter (HOSPITAL_COMMUNITY)
Admission: RE | Admit: 2011-09-01 | Discharge: 2011-09-01 | Disposition: A | Discharge status: Home / Self Care | Payer: 59 | Source: Ambulatory Visit | Attending: Nephrology | Admitting: Nephrology

## 2011-09-01 ENCOUNTER — Other Ambulatory Visit: Payer: Self-pay | Admitting: Nephrology

## 2011-09-01 DIAGNOSIS — N184 Chronic kidney disease, stage 4 (severe): Secondary | ICD-10-CM | POA: Insufficient documentation

## 2011-09-01 DIAGNOSIS — D638 Anemia in other chronic diseases classified elsewhere: Secondary | ICD-10-CM | POA: Insufficient documentation

## 2011-09-01 LAB — POCT HEMOGLOBIN-HEMACUE: Hemoglobin: 12.3 g/dL (ref 12.0–15.0)

## 2011-09-15 ENCOUNTER — Encounter (HOSPITAL_COMMUNITY): Payer: 59

## 2011-09-23 ENCOUNTER — Other Ambulatory Visit: Payer: Self-pay | Admitting: Nephrology

## 2011-09-23 ENCOUNTER — Encounter (HOSPITAL_COMMUNITY): Payer: 59 | Attending: Nephrology

## 2011-09-23 DIAGNOSIS — N184 Chronic kidney disease, stage 4 (severe): Secondary | ICD-10-CM | POA: Insufficient documentation

## 2011-09-23 DIAGNOSIS — D638 Anemia in other chronic diseases classified elsewhere: Secondary | ICD-10-CM | POA: Insufficient documentation

## 2011-09-23 LAB — IRON AND TIBC
Iron: 70 ug/dL (ref 42–135)
Saturation Ratios: 27 % (ref 20–55)
TIBC: 260 ug/dL (ref 250–470)

## 2011-10-12 ENCOUNTER — Other Ambulatory Visit (HOSPITAL_COMMUNITY): Payer: Self-pay | Admitting: *Deleted

## 2011-10-13 ENCOUNTER — Encounter (HOSPITAL_COMMUNITY)
Admission: RE | Admit: 2011-10-13 | Discharge: 2011-10-13 | Disposition: A | Discharge status: Home / Self Care | Payer: 59 | Source: Ambulatory Visit | Attending: Nephrology | Admitting: Nephrology

## 2011-10-13 DIAGNOSIS — D638 Anemia in other chronic diseases classified elsewhere: Secondary | ICD-10-CM | POA: Insufficient documentation

## 2011-10-13 DIAGNOSIS — N184 Chronic kidney disease, stage 4 (severe): Secondary | ICD-10-CM | POA: Insufficient documentation

## 2011-10-13 LAB — URINALYSIS, ROUTINE W REFLEX MICROSCOPIC
Bilirubin Urine: NEGATIVE
Ketones, ur: NEGATIVE mg/dL
Protein, ur: 30 mg/dL — AB
Specific Gravity, Urine: 1.009 (ref 1.005–1.030)
Urobilinogen, UA: 0.2 mg/dL (ref 0.0–1.0)

## 2011-10-13 LAB — COMPREHENSIVE METABOLIC PANEL
ALT: 15 U/L (ref 0–35)
Alkaline Phosphatase: 88 U/L (ref 39–117)
CO2: 26 mEq/L (ref 19–32)
GFR calc Af Amer: 7 mL/min — ABNORMAL LOW (ref >90)
GFR calc non Af Amer: 6 mL/min — ABNORMAL LOW (ref >90)
Glucose, Bld: 86 mg/dL (ref 70–99)
Potassium: 4.1 mEq/L (ref 3.5–5.1)
Sodium: 141 mEq/L (ref 135–145)
Total Bilirubin: 0.6 mg/dL (ref 0.3–1.2)

## 2011-10-13 LAB — CBC
HCT: 38.7 % (ref 36.0–46.0)
Hemoglobin: 12.6 g/dL (ref 12.0–15.0)
RBC: 3.9 MIL/uL (ref 3.87–5.11)

## 2011-10-13 LAB — URINE MICROSCOPIC-ADD ON

## 2011-10-13 LAB — POCT HEMOGLOBIN-HEMACUE: Hemoglobin: 12.5 g/dL (ref 12.0–15.0)

## 2011-10-13 MED ORDER — EPOETIN ALFA 10000 UNIT/ML IJ SOLN: 30000.0000 [IU] | INTRAMUSCULAR | Status: DC

## 2011-10-14 LAB — PTH, INTACT AND CALCIUM
Calcium, Total (PTH): 10.8 mg/dL — ABNORMAL HIGH (ref 8.4–10.5)
PTH: 180.3 pg/mL — ABNORMAL HIGH (ref 14.0–72.0)

## 2011-11-03 ENCOUNTER — Encounter (HOSPITAL_COMMUNITY)
Admission: RE | Admit: 2011-11-03 | Discharge: 2011-11-03 | Disposition: A | Discharge status: Home / Self Care | Payer: 59 | Source: Ambulatory Visit | Attending: Nephrology | Admitting: Nephrology

## 2011-11-03 DIAGNOSIS — D638 Anemia in other chronic diseases classified elsewhere: Secondary | ICD-10-CM | POA: Insufficient documentation

## 2011-11-03 DIAGNOSIS — N184 Chronic kidney disease, stage 4 (severe): Secondary | ICD-10-CM | POA: Insufficient documentation

## 2011-11-03 LAB — IRON AND TIBC: Saturation Ratios: 14 % — ABNORMAL LOW (ref 20–55)

## 2011-11-03 MED ORDER — EPOETIN ALFA 10000 UNIT/ML IJ SOLN
30000.0000 [IU] | INTRAMUSCULAR | Status: AC
  Administered 2011-11-03: 30000 [IU] via SUBCUTANEOUS

## 2011-11-25 ENCOUNTER — Encounter (HOSPITAL_COMMUNITY): Payer: 59

## 2011-11-25 ENCOUNTER — Encounter (HOSPITAL_COMMUNITY)
Admission: RE | Admit: 2011-11-25 | Discharge: 2011-11-25 | Disposition: A | Discharge status: Home / Self Care | Payer: 59 | Source: Ambulatory Visit | Attending: Nephrology | Admitting: Nephrology

## 2011-11-25 DIAGNOSIS — D638 Anemia in other chronic diseases classified elsewhere: Secondary | ICD-10-CM | POA: Insufficient documentation

## 2011-11-25 DIAGNOSIS — N184 Chronic kidney disease, stage 4 (severe): Secondary | ICD-10-CM | POA: Insufficient documentation

## 2011-11-25 LAB — POCT HEMOGLOBIN-HEMACUE: Hemoglobin: 11.6 g/dL — ABNORMAL LOW (ref 12.0–15.0)

## 2011-11-25 MED ORDER — EPOETIN ALFA 10000 UNIT/ML IJ SOLN: 30000.0000 [IU] | INTRAMUSCULAR | Status: DC

## 2011-11-25 MED ORDER — EPOETIN ALFA 20000 UNIT/ML IJ SOLN
INTRAMUSCULAR | Status: AC
  Administered 2011-11-25: 20000 [IU] via SUBCUTANEOUS
  Filled 2011-11-25: qty 1

## 2011-11-25 MED ORDER — FERUMOXYTOL INJECTION 510 MG/17 ML
510.0000 mg | Freq: Once | INTRAVENOUS | Status: AC
  Administered 2011-11-25: 510 mg via INTRAVENOUS

## 2011-11-25 MED ORDER — FERUMOXYTOL INJECTION 510 MG/17 ML
INTRAVENOUS | Status: AC
  Filled 2011-11-25: qty 17

## 2011-11-25 MED ORDER — EPOETIN ALFA 10000 UNIT/ML IJ SOLN
INTRAMUSCULAR | Status: AC
  Administered 2011-11-25: 10000 [IU] via SUBCUTANEOUS
  Filled 2011-11-25: qty 1

## 2011-12-11 ENCOUNTER — Other Ambulatory Visit (HOSPITAL_COMMUNITY): Payer: Self-pay | Admitting: *Deleted

## 2011-12-13 ENCOUNTER — Emergency Department (HOSPITAL_COMMUNITY)
Admission: EM | Admit: 2011-12-13 | Discharge: 2011-12-13 | Disposition: A | Discharge status: Home / Self Care | Payer: 59 | Attending: Emergency Medicine | Admitting: Emergency Medicine

## 2011-12-13 ENCOUNTER — Encounter (HOSPITAL_COMMUNITY): Payer: Self-pay | Admitting: Emergency Medicine

## 2011-12-13 DIAGNOSIS — S0501XA Injury of conjunctiva and corneal abrasion without foreign body, right eye, initial encounter: Secondary | ICD-10-CM

## 2011-12-13 DIAGNOSIS — IMO0002 Reserved for concepts with insufficient information to code with codable children: Secondary | ICD-10-CM | POA: Insufficient documentation

## 2011-12-13 DIAGNOSIS — S058X9A Other injuries of unspecified eye and orbit, initial encounter: Secondary | ICD-10-CM | POA: Insufficient documentation

## 2011-12-13 DIAGNOSIS — H5710 Ocular pain, unspecified eye: Secondary | ICD-10-CM | POA: Insufficient documentation

## 2011-12-13 HISTORY — DX: Polycystic kidney, unspecified: Q61.3

## 2011-12-13 MED ORDER — TETRACAINE HCL 0.5 % OP SOLN
2.0000 [drp] | Freq: Once | OPHTHALMIC | Status: AC
  Administered 2011-12-13: 2 [drp] via OPHTHALMIC
  Filled 2011-12-13: qty 2

## 2011-12-13 MED ORDER — KETOROLAC TROMETHAMINE 0.5 % OP SOLN: 2.0000 [drp] | Freq: Four times a day (QID) | OPHTHALMIC | Status: AC

## 2011-12-13 NOTE — ED Provider Notes (Signed)
Medical screening examination/treatment/procedure(s) were performed by non-physician practitioner and as supervising physician I was immediately available for consultation/collaboration.   Trisha Mangle, MD 12/13/11 2249

## 2011-12-13 NOTE — ED Provider Notes (Signed)
History     CSN: [id number]  Arrival date & time 12/13/11  1845   First MD Initiated Contact with Patient 12/13/11 1948      Chief Complaint  Patient presents with  . Eye Pain    was cutting jalapeno and thinks she got some in her eye 2 days ago thinks she rubbed it and cut it     (Consider location/radiation/quality/duration/timing/severity/associated sxs/prior treatment) HPI  Pt presents to the ED with complaints of getting Jalapeno juice in her eye yesterday around lunch time. She states it burned but she put milk in her eye which helped relieve a lot of the burning. When she woke up this morning it stopped burning quite as bad but as she has gone throughout her day it has began to burn again. She states her vision is more blurry than normal (she wears glasses) and does admit to photophobia.  She denies headache, weakness, chest pain, SOB.  Past Medical History  Diagnosis Date  . Polycystic kidney   . Hypertension   . Thyroid disease     Past Surgical History  Procedure Date  . Av fistula placement     not doing diaylsis yet     No family history on file.  History  Substance Use Topics  . Smoking status: Not on file  . Smokeless tobacco: Not on file  . Alcohol Use:     OB History    Grav Para Term Preterm Abortions TAB SAB Ect Mult Living                  Review of Systems  All other systems reviewed and are negative.    Allergies  Review of patient's allergies indicates no known allergies.  Home Medications   Current Outpatient Rx  Name Route Sig Dispense Refill  . KETOROLAC TROMETHAMINE 0.5 % OP SOLN Right Eye Place 2 drops into the right eye every 6 (six) hours. 5 mL 0    BP 155/69  Pulse 91  Resp 18  SpO2 98%  Physical Exam  Constitutional: She appears well-developed and well-nourished.  HENT:  Head: Normocephalic and atraumatic.  Eyes: EOM are normal. Pupils are equal, round, and reactive to light. Right eye exhibits no chemosis, no  discharge, no exudate and no hordeolum. No foreign body present in the right eye. Right conjunctiva is injected. Right conjunctiva has no hemorrhage. No scleral icterus.    Neck: Trachea normal, normal range of motion and full passive range of motion without pain. Neck supple.  Cardiovascular: Normal rate, regular rhythm and normal pulses.   Pulmonary/Chest: Effort normal. Chest wall is not dull to percussion. She exhibits no tenderness, no crepitus, no edema, no deformity and no retraction.  Abdominal: Normal appearance.  Musculoskeletal: Normal range of motion.  Neurological: She is alert. She has normal strength.  Skin: Skin is warm, dry and intact.  Psychiatric: She has a normal mood and affect. Her speech is normal and behavior is normal. Judgment and thought content normal. Cognition and memory are normal.    ED Course  Procedures (including critical care time)  Labs Reviewed - No data to display No results found.   1. Corneal abrasion, right       MDM  Pt has an appointment with her eye doctor coming up this week or next week. I have advised her to see her eye doctor this week. Pt will be given antiinflammatory eye drops for pain.  Visual acuity unchanged from patients baseline  Linus Mako, PA 12/13/11 2129

## 2011-12-15 ENCOUNTER — Encounter (HOSPITAL_COMMUNITY)
Admission: RE | Admit: 2011-12-15 | Discharge: 2011-12-15 | Disposition: A | Discharge status: Home / Self Care | Payer: 59 | Source: Ambulatory Visit | Attending: Nephrology | Admitting: Nephrology

## 2011-12-15 LAB — IRON AND TIBC
Iron: 34 ug/dL — ABNORMAL LOW (ref 42–135)
Saturation Ratios: 13 % — ABNORMAL LOW (ref 20–55)
TIBC: 258 ug/dL (ref 250–470)
UIBC: 224 ug/dL (ref 125–400)

## 2011-12-15 MED ORDER — EPOETIN ALFA 10000 UNIT/ML IJ SOLN: 30000.0000 [IU] | INTRAMUSCULAR | Status: DC

## 2011-12-28 ENCOUNTER — Other Ambulatory Visit (HOSPITAL_COMMUNITY): Payer: Self-pay | Admitting: *Deleted

## 2011-12-29 ENCOUNTER — Encounter (HOSPITAL_COMMUNITY)
Admission: RE | Admit: 2011-12-29 | Discharge: 2011-12-29 | Disposition: A | Discharge status: Home / Self Care | Payer: 59 | Source: Ambulatory Visit | Attending: Nephrology | Admitting: Nephrology

## 2011-12-29 ENCOUNTER — Encounter (HOSPITAL_COMMUNITY): Payer: 59

## 2011-12-29 DIAGNOSIS — N184 Chronic kidney disease, stage 4 (severe): Secondary | ICD-10-CM | POA: Insufficient documentation

## 2011-12-29 DIAGNOSIS — D638 Anemia in other chronic diseases classified elsewhere: Secondary | ICD-10-CM | POA: Insufficient documentation

## 2011-12-29 LAB — RENAL FUNCTION PANEL
BUN: 91 mg/dL — ABNORMAL HIGH (ref 6–23)
Calcium: 9.8 mg/dL (ref 8.4–10.5)
Creatinine, Ser: 6.75 mg/dL — ABNORMAL HIGH (ref 0.50–1.10)
Glucose, Bld: 92 mg/dL (ref 70–99)
Phosphorus: 4.7 mg/dL — ABNORMAL HIGH (ref 2.3–4.6)

## 2011-12-29 MED ORDER — FERUMOXYTOL INJECTION 510 MG/17 ML
510.0000 mg | Freq: Once | INTRAVENOUS | Status: AC
  Administered 2011-12-29: 510 mg via INTRAVENOUS

## 2011-12-29 MED ORDER — EPOETIN ALFA 20000 UNIT/ML IJ SOLN
INTRAMUSCULAR | Status: AC
  Administered 2011-12-29: 30000 [IU] via SUBCUTANEOUS
  Filled 2011-12-29: qty 1

## 2011-12-29 MED ORDER — FERUMOXYTOL INJECTION 510 MG/17 ML
INTRAVENOUS | Status: AC
  Administered 2011-12-29: 510 mg via INTRAVENOUS
  Filled 2011-12-29: qty 17

## 2011-12-29 MED ORDER — EPOETIN ALFA 10000 UNIT/ML IJ SOLN
INTRAMUSCULAR | Status: AC
  Filled 2011-12-29: qty 1

## 2011-12-29 MED ORDER — EPOETIN ALFA 10000 UNIT/ML IJ SOLN: 30000.0000 [IU] | INTRAMUSCULAR | Status: DC

## 2011-12-31 MED FILL — Epoetin Alfa Inj 10000 Unit/ML: INTRAMUSCULAR | Qty: 1 | Status: AC

## 2012-01-05 ENCOUNTER — Encounter (HOSPITAL_COMMUNITY): Payer: 59

## 2012-01-19 ENCOUNTER — Encounter (HOSPITAL_COMMUNITY)
Admission: RE | Admit: 2012-01-19 | Discharge: 2012-01-19 | Disposition: A | Discharge status: Home / Self Care | Payer: 59 | Source: Ambulatory Visit | Attending: Nephrology | Admitting: Nephrology

## 2012-01-19 LAB — IRON AND TIBC
Saturation Ratios: 30 % (ref 20–55)
UIBC: 159 ug/dL (ref 125–400)

## 2012-01-19 LAB — POCT HEMOGLOBIN-HEMACUE: Hemoglobin: 11.8 g/dL — ABNORMAL LOW (ref 12.0–15.0)

## 2012-01-19 MED ORDER — EPOETIN ALFA 10000 UNIT/ML IJ SOLN: 30000.0000 [IU] | INTRAMUSCULAR | Status: DC

## 2012-01-19 MED ORDER — EPOETIN ALFA 20000 UNIT/ML IJ SOLN
INTRAMUSCULAR | Status: AC
  Administered 2012-01-19: 20000 [IU] via SUBCUTANEOUS
  Filled 2012-01-19: qty 1

## 2012-01-19 MED ORDER — EPOETIN ALFA 10000 UNIT/ML IJ SOLN
INTRAMUSCULAR | Status: AC
  Administered 2012-01-19: 10000 [IU] via SUBCUTANEOUS
  Filled 2012-01-19: qty 1

## 2012-01-26 ENCOUNTER — Encounter (HOSPITAL_COMMUNITY): Payer: 59

## 2012-02-09 ENCOUNTER — Encounter (HOSPITAL_COMMUNITY): Payer: 59

## 2012-02-22 ENCOUNTER — Ambulatory Visit (HOSPITAL_COMMUNITY)
Admission: RE | Admit: 2012-02-22 | Discharge: 2012-02-22 | Disposition: A | Discharge status: Home / Self Care | Payer: 59 | Source: Ambulatory Visit | Attending: Nephrology | Admitting: Nephrology

## 2012-02-22 ENCOUNTER — Other Ambulatory Visit (HOSPITAL_COMMUNITY): Payer: Self-pay | Admitting: Nephrology

## 2012-02-22 DIAGNOSIS — N186 End stage renal disease: Secondary | ICD-10-CM | POA: Insufficient documentation

## 2012-02-22 LAB — POCT I-STAT 4, (NA,K, GLUC, HGB,HCT)
Glucose, Bld: 85 mg/dL (ref 70–99)
HCT: 35 % — ABNORMAL LOW (ref 36.0–46.0)

## 2012-02-22 MED ORDER — IOHEXOL 300 MG/ML  SOLN
100.0000 mL | Freq: Once | INTRAMUSCULAR | Status: AC | PRN
  Administered 2012-02-22: 40 mL via INTRAVENOUS

## 2012-02-23 ENCOUNTER — Other Ambulatory Visit (HOSPITAL_COMMUNITY): Payer: Self-pay | Admitting: Interventional Radiology

## 2012-02-23 ENCOUNTER — Other Ambulatory Visit (HOSPITAL_COMMUNITY): Payer: Self-pay | Admitting: Nephrology

## 2012-02-23 DIAGNOSIS — N186 End stage renal disease: Secondary | ICD-10-CM

## 2012-02-24 ENCOUNTER — Telehealth (HOSPITAL_COMMUNITY): Payer: Self-pay

## 2012-02-26 ENCOUNTER — Ambulatory Visit (HOSPITAL_COMMUNITY)
Admission: RE | Admit: 2012-02-26 | Discharge: 2012-02-26 | Disposition: A | Discharge status: Home / Self Care | Payer: 59 | Source: Ambulatory Visit | Attending: Nephrology | Admitting: Nephrology

## 2012-02-26 ENCOUNTER — Other Ambulatory Visit (HOSPITAL_COMMUNITY): Payer: Self-pay | Admitting: Nephrology

## 2012-02-26 DIAGNOSIS — I82619 Acute embolism and thrombosis of superficial veins of unspecified upper extremity: Secondary | ICD-10-CM | POA: Insufficient documentation

## 2012-02-26 DIAGNOSIS — N186 End stage renal disease: Secondary | ICD-10-CM

## 2012-02-26 DIAGNOSIS — T82898A Other specified complication of vascular prosthetic devices, implants and grafts, initial encounter: Secondary | ICD-10-CM | POA: Insufficient documentation

## 2012-02-26 DIAGNOSIS — Y832 Surgical operation with anastomosis, bypass or graft as the cause of abnormal reaction of the patient, or of later complication, without mention of misadventure at the time of the procedure: Secondary | ICD-10-CM | POA: Insufficient documentation

## 2012-02-26 MED ORDER — MIDAZOLAM HCL 2 MG/2ML IJ SOLN
INTRAMUSCULAR | Status: AC
  Filled 2012-02-26: qty 4

## 2012-02-26 MED ORDER — FENTANYL CITRATE 0.05 MG/ML IJ SOLN
INTRAMUSCULAR | Status: AC
  Filled 2012-02-26: qty 4

## 2012-02-26 MED ORDER — IOHEXOL 300 MG/ML  SOLN
100.0000 mL | Freq: Once | INTRAMUSCULAR | Status: AC | PRN
  Administered 2012-02-26: 80 mL via INTRAVENOUS

## 2012-02-26 MED ORDER — FENTANYL CITRATE 0.05 MG/ML IJ SOLN
INTRAMUSCULAR | Status: AC | PRN
  Administered 2012-02-26: 50 ug via INTRAVENOUS

## 2012-02-26 MED ORDER — MIDAZOLAM HCL 5 MG/5ML IJ SOLN
INTRAMUSCULAR | Status: AC | PRN
  Administered 2012-02-26: 1 mg via INTRAVENOUS

## 2012-02-26 NOTE — ED Notes (Signed)
Pt taken to car via wheelchair.  Her daughter will be driving her home.  Discharge instructions given to pt and her daughter

## 2012-02-26 NOTE — Discharge Instructions (Signed)
Moderate Sedation, Adult Moderate sedation is given to help you relax or even sleep through a procedure. You may remain sleepy, be clumsy, or have poor balance for several hours following this procedure. Arrange for a responsible adult, family member, or friend to take you home. A responsible adult should stay with you for at least 24 hours or until the medicines have worn off.  Do not participate in any activities where you could become injured for the next 24 hours, or until you feel normal again. Do not:   Drive.   Swim.   Ride a bicycle.   Operate heavy machinery.   Cook.   Use power tools.   Climb ladders.   Work at heights.   Do not make important decisions or sign legal documents until you are improved.   Vomiting may occur if you eat too soon. When you can drink without vomiting, try water, juice, or soup. Try solid foods if you feel little or no nausea.   Only take over-the-counter or prescription medications for pain, discomfort, or fever as directed by your caregiver.If pain medications have been prescribed for you, ask your caregiver how soon it is safe to take them.   Make sure you and your family fully understands everything about the medication given to you. Make sure you understand what side effects may occur.   You should not drink alcohol, take sleeping pills, or medications that cause drowsiness for at least 24 hours.   If you smoke, do not smoke alone.   If you are feeling better, you may resume normal activities 24 hours after receiving sedation.   Keep all appointments as scheduled. Follow all instructions.   Ask questions if you do not understand.  SEEK MEDICAL CARE IF:   Your skin is pale or bluish in color.   You continue to feel sick to your stomach (nauseous) or throw up (vomit).   Your pain is getting worse and not helped by medication.   You have bleeding or swelling.   You are still sleepy or feeling clumsy after 24 hours.  SEEK IMMEDIATE  MEDICAL CARE IF:   You develop a rash.   You have difficulty breathing.   You develop any type of allergic problem.   You have a fever.  Document Released: 08/04/2001 Document Revised: 10/29/2011 Document Reviewed: 12/26/2007 Exit[employer] Patient Information 2012 [employer]. 

## 2012-02-29 ENCOUNTER — Telehealth (HOSPITAL_COMMUNITY): Payer: Self-pay

## 2012-03-22 ENCOUNTER — Encounter: Payer: Self-pay | Admitting: Vascular Surgery

## 2012-03-23 ENCOUNTER — Encounter: Payer: Self-pay | Admitting: Vascular Surgery

## 2012-03-23 ENCOUNTER — Ambulatory Visit (INDEPENDENT_AMBULATORY_CARE_PROVIDER_SITE_OTHER): Payer: 59 | Admitting: Vascular Surgery

## 2012-03-23 ENCOUNTER — Encounter (HOSPITAL_COMMUNITY): Payer: Self-pay | Admitting: Pharmacy Technician

## 2012-03-23 VITALS — BP 118/67 | HR 95 | Temp 98.2°F | Ht 61.0 in | Wt 183.0 lb

## 2012-03-23 DIAGNOSIS — N186 End stage renal disease: Secondary | ICD-10-CM

## 2012-03-23 DIAGNOSIS — T82898A Other specified complication of vascular prosthetic devices, implants and grafts, initial encounter: Secondary | ICD-10-CM | POA: Insufficient documentation

## 2012-03-23 NOTE — Progress Notes (Signed)
[facility]  Patient name: April [name] MRN: [id number] DOB: May 20, 1956 Sex: female  REASON FOR VISIT: left upper arm AV fistula difficult to access.  HPI: April [name] is a 56 y.o. female who had a left brachiocephalic AV fistula placed in November of 2011. She began hemodialysis in March of this year. She hopes to eventually drained for home dialysis. Is an area of her fistula which has been difficult to cannulate and she had a fistulogram which showed some tortuosity of the fistula in the mid upper arm. There was also an area of stenosis that was angioplastied. She's continued to have problems with access. She currently has a unique dialysis schedule him that she dialyzes on Mondays Wednesdays and Saturdays. She's had no recent uremic symptoms. She denies nausea, vomiting, fatigue, anorexia, or palpitations.   REVIEW OF SYSTEMS: Valu.Nieves ] denotes positive finding; [  ] denotes negative finding  CARDIOVASCULAR:  [ ]  chest pain   [ ]  dyspnea on exertion    CONSTITUTIONAL:  [ ]  fever   [ ]  chills  PHYSICAL EXAM: Filed Vitals:   03/23/12 0857  BP: 118/67  Pulse: 95  Temp: 98.2 F (36.8 C)  TempSrc: Oral  Height: 5\' 1"  (1.549 m)  Weight: 183 lb (83.008 kg)   Body mass index is 34.58 kg/(m^2). GENERAL: The patient is a well-nourished female, in no acute distress. The vital signs are documented above. CARDIOVASCULAR: There is a regular rate and rhythm  PULMONARY: There is good air exchange bilaterally without wheezing or rales. Her fistula in the left upper arm appears to be low large in size. It has an excellent thrill. 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 She has a palpable left radial pulse.  I have reviewed her fistulogram which shows an area of tortuosity in the midportion of the vein. There were also several small branches which are noted.  MEDICAL ISSUES: Given the problems that she is having with accessing her  fistula I think the best way to attempt to salvage the fistula would be to fully mobilize the vein and then tunnel it such that this would straighten out the area of tortuosity. Given the aneurysm at the proximal anastomosis if there is enough length we could potentially excise this and reconnect the vein here. We'll also ligate the competing branches. I think this would be the best solution to her problem. This would require that the fistula not be access for several weeks and therefore we also have to place a dietetic catheter. Have discussed the procedure potential complications with the patient and she is agreeable to proceed. Her surgery scheduled for 03/31/2012.  [facility] [place] Beeper: [phone]

## 2012-03-28 ENCOUNTER — Other Ambulatory Visit: Payer: Self-pay

## 2012-03-30 ENCOUNTER — Encounter (HOSPITAL_COMMUNITY): Payer: Self-pay | Admitting: *Deleted

## 2012-03-30 MED ORDER — CEFAZOLIN SODIUM-DEXTROSE 2-3 GM-% IV SOLR
2.0000 g | INTRAVENOUS | Status: AC
  Administered 2012-03-31: 2 g via INTRAVENOUS
  Filled 2012-03-30: qty 50

## 2012-03-31 ENCOUNTER — Ambulatory Visit (HOSPITAL_COMMUNITY): Payer: 59 | Admitting: Anesthesiology

## 2012-03-31 ENCOUNTER — Ambulatory Visit (HOSPITAL_COMMUNITY): Payer: 59

## 2012-03-31 ENCOUNTER — Encounter (HOSPITAL_COMMUNITY): Payer: Self-pay | Admitting: Anesthesiology

## 2012-03-31 ENCOUNTER — Other Ambulatory Visit: Payer: Self-pay | Admitting: *Deleted

## 2012-03-31 ENCOUNTER — Telehealth: Payer: Self-pay | Admitting: Vascular Surgery

## 2012-03-31 ENCOUNTER — Encounter (HOSPITAL_COMMUNITY): Payer: Self-pay | Admitting: Surgery

## 2012-03-31 ENCOUNTER — Ambulatory Visit (HOSPITAL_COMMUNITY)
Admission: RE | Admit: 2012-03-31 | Discharge: 2012-03-31 | Disposition: A | Discharge status: Home / Self Care | Payer: 59 | Source: Ambulatory Visit | Attending: Vascular Surgery | Admitting: Vascular Surgery

## 2012-03-31 ENCOUNTER — Encounter (HOSPITAL_COMMUNITY)
Admission: RE | Disposition: A | Discharge status: Home / Self Care | Payer: Self-pay | Source: Ambulatory Visit | Attending: Vascular Surgery

## 2012-03-31 DIAGNOSIS — F3289 Other specified depressive episodes: Secondary | ICD-10-CM | POA: Insufficient documentation

## 2012-03-31 DIAGNOSIS — Y832 Surgical operation with anastomosis, bypass or graft as the cause of abnormal reaction of the patient, or of later complication, without mention of misadventure at the time of the procedure: Secondary | ICD-10-CM | POA: Insufficient documentation

## 2012-03-31 DIAGNOSIS — T82898A Other specified complication of vascular prosthetic devices, implants and grafts, initial encounter: Secondary | ICD-10-CM

## 2012-03-31 DIAGNOSIS — N186 End stage renal disease: Secondary | ICD-10-CM

## 2012-03-31 DIAGNOSIS — T82598A Other mechanical complication of other cardiac and vascular devices and implants, initial encounter: Secondary | ICD-10-CM

## 2012-03-31 DIAGNOSIS — F329 Major depressive disorder, single episode, unspecified: Secondary | ICD-10-CM | POA: Insufficient documentation

## 2012-03-31 DIAGNOSIS — K219 Gastro-esophageal reflux disease without esophagitis: Secondary | ICD-10-CM | POA: Insufficient documentation

## 2012-03-31 DIAGNOSIS — J45909 Unspecified asthma, uncomplicated: Secondary | ICD-10-CM | POA: Insufficient documentation

## 2012-03-31 HISTORY — DX: Other specified postprocedural states: R11.2

## 2012-03-31 HISTORY — PX: INSERTION OF DIALYSIS CATHETER: SHX1324

## 2012-03-31 HISTORY — DX: Other specified postprocedural states: Z98.890

## 2012-03-31 LAB — SURGICAL PCR SCREEN
MRSA, PCR: NEGATIVE
Staphylococcus aureus: POSITIVE — AB

## 2012-03-31 SURGERY — INSERTION OF DIALYSIS CATHETER
Anesthesia: General | Site: Neck | Wound class: Clean

## 2012-03-31 MED ORDER — ONDANSETRON HCL 4 MG/2ML IJ SOLN
INTRAMUSCULAR | Status: DC | PRN
  Administered 2012-03-31 (×2): 4 mg via INTRAVENOUS

## 2012-03-31 MED ORDER — ONDANSETRON HCL 4 MG/2ML IJ SOLN: 4.0000 mg | Freq: Once | INTRAMUSCULAR | Status: DC | PRN

## 2012-03-31 MED ORDER — HEPARIN SODIUM (PORCINE) 1000 UNIT/ML IJ SOLN
INTRAMUSCULAR | Status: DC | PRN
  Administered 2012-03-31: 6000 [IU] via INTRAVENOUS

## 2012-03-31 MED ORDER — PROPOFOL 10 MG/ML IV EMUL
INTRAVENOUS | Status: DC | PRN
  Administered 2012-03-31: 150 mg via INTRAVENOUS

## 2012-03-31 MED ORDER — PHENYLEPHRINE HCL 10 MG/ML IJ SOLN
10.0000 mg | INTRAVENOUS | Status: DC | PRN
  Administered 2012-03-31: 10 ug/min via INTRAVENOUS

## 2012-03-31 MED ORDER — SODIUM CHLORIDE 0.9 % IV SOLN
INTRAVENOUS | Status: DC
  Administered 2012-03-31 (×2): via INTRAVENOUS

## 2012-03-31 MED ORDER — HEPARIN SODIUM (PORCINE) 1000 UNIT/ML IJ SOLN
INTRAMUSCULAR | Status: DC | PRN
  Administered 2012-03-31: 4.2 mL

## 2012-03-31 MED ORDER — PROTAMINE SULFATE 10 MG/ML IV SOLN
INTRAVENOUS | Status: DC | PRN
  Administered 2012-03-31: 40 mg via INTRAVENOUS

## 2012-03-31 MED ORDER — MUPIROCIN 2 % EX OINT
TOPICAL_OINTMENT | CUTANEOUS | Status: AC
  Administered 2012-03-31: 1 via NASAL
  Filled 2012-03-31: qty 22

## 2012-03-31 MED ORDER — MIDAZOLAM HCL 5 MG/5ML IJ SOLN
INTRAMUSCULAR | Status: DC | PRN
  Administered 2012-03-31: 2 mg via INTRAVENOUS

## 2012-03-31 MED ORDER — MORPHINE SULFATE 4 MG/ML IJ SOLN: 0.0500 mg/kg | INTRAMUSCULAR | Status: DC | PRN

## 2012-03-31 MED ORDER — OXYCODONE HCL 5 MG PO CAPS: 5.0000 mg | ORAL_CAPSULE | Freq: Four times a day (QID) | ORAL | Status: AC | PRN

## 2012-03-31 MED ORDER — EPHEDRINE SULFATE 50 MG/ML IJ SOLN
INTRAMUSCULAR | Status: DC | PRN
  Administered 2012-03-31: 10 mg via INTRAVENOUS

## 2012-03-31 MED ORDER — LIDOCAINE-EPINEPHRINE (PF) 1 %-1:200000 IJ SOLN
INTRAMUSCULAR | Status: DC | PRN
  Administered 2012-03-31: 13 mL

## 2012-03-31 MED ORDER — SODIUM CHLORIDE 0.9 % IR SOLN
Status: DC | PRN
  Administered 2012-03-31: 08:00:00

## 2012-03-31 MED ORDER — FENTANYL CITRATE 0.05 MG/ML IJ SOLN
INTRAMUSCULAR | Status: DC | PRN
  Administered 2012-03-31: 25 ug via INTRAVENOUS
  Administered 2012-03-31: 100 ug via INTRAVENOUS
  Administered 2012-03-31: 25 ug via INTRAVENOUS
  Administered 2012-03-31: 50 ug via INTRAVENOUS

## 2012-03-31 MED ORDER — LIDOCAINE HCL (CARDIAC) 20 MG/ML IV SOLN
INTRAVENOUS | Status: DC | PRN
  Administered 2012-03-31: 50 mg via INTRAVENOUS

## 2012-03-31 MED ORDER — MUPIROCIN 2 % EX OINT
TOPICAL_OINTMENT | Freq: Two times a day (BID) | CUTANEOUS | Status: DC
  Filled 2012-03-31: qty 22

## 2012-03-31 MED ORDER — PHENYLEPHRINE HCL 10 MG/ML IJ SOLN
INTRAMUSCULAR | Status: DC | PRN
  Administered 2012-03-31: 80 ug via INTRAVENOUS
  Administered 2012-03-31: 40 ug via INTRAVENOUS
  Administered 2012-03-31 (×2): 80 ug via INTRAVENOUS
  Administered 2012-03-31: 120 ug via INTRAVENOUS

## 2012-03-31 MED ORDER — HYDROMORPHONE HCL PF 1 MG/ML IJ SOLN
0.2500 mg | INTRAMUSCULAR | Status: DC | PRN
  Administered 2012-03-31 (×2): 0.5 mg via INTRAVENOUS

## 2012-03-31 MED ORDER — SODIUM CHLORIDE 0.9 % IR SOLN
Status: DC | PRN
  Administered 2012-03-31: 1000 mL

## 2012-03-31 SURGICAL SUPPLY — 63 items
BAG DECANTER FOR FLEXI CONT (MISCELLANEOUS) ×3 IMPLANT
CANISTER SUCTION 2500CC (MISCELLANEOUS) ×3 IMPLANT
CATH CANNON HEMO 15F 50CM (CATHETERS) IMPLANT
CATH CANNON HEMO 15FR 19 (HEMODIALYSIS SUPPLIES) ×3 IMPLANT
CATH CANNON HEMO 15FR 23CM (HEMODIALYSIS SUPPLIES) ×3 IMPLANT
CATH CANNON HEMO 15FR 31CM (HEMODIALYSIS SUPPLIES) IMPLANT
CATH CANNON HEMO 15FR 32CM (HEMODIALYSIS SUPPLIES) IMPLANT
CLIP TI MEDIUM 6 (CLIP) ×3 IMPLANT
CLIP TI WIDE RED SMALL 6 (CLIP) ×3 IMPLANT
CLOTH BEACON ORANGE TIMEOUT ST (SAFETY) ×3 IMPLANT
COVER PROBE W GEL 5X96 (DRAPES) ×3 IMPLANT
COVER SURGICAL LIGHT HANDLE (MISCELLANEOUS) ×6 IMPLANT
DECANTER SPIKE VIAL GLASS SM (MISCELLANEOUS) ×3 IMPLANT
DERMABOND ADHESIVE PROPEN (GAUZE/BANDAGES/DRESSINGS) ×1
DERMABOND ADVANCED (GAUZE/BANDAGES/DRESSINGS) ×1
DERMABOND ADVANCED .7 DNX12 (GAUZE/BANDAGES/DRESSINGS) ×2 IMPLANT
DERMABOND ADVANCED .7 DNX6 (GAUZE/BANDAGES/DRESSINGS) ×2 IMPLANT
DRAIN PENROSE 1/2X12 LTX STRL (WOUND CARE) IMPLANT
DRAPE C-ARM 42X72 X-RAY (DRAPES) ×3 IMPLANT
DRAPE CHEST BREAST 15X10 FENES (DRAPES) ×3 IMPLANT
ELECT REM PT RETURN 9FT ADLT (ELECTROSURGICAL) ×3
ELECTRODE REM PT RTRN 9FT ADLT (ELECTROSURGICAL) ×2 IMPLANT
GAUZE SPONGE 2X2 8PLY STRL LF (GAUZE/BANDAGES/DRESSINGS) ×2 IMPLANT
GAUZE SPONGE 4X4 16PLY XRAY LF (GAUZE/BANDAGES/DRESSINGS) ×3 IMPLANT
GLOVE BIO SURGEON STRL SZ7.5 (GLOVE) ×6 IMPLANT
GLOVE BIOGEL M 6.5 STRL (GLOVE) ×6 IMPLANT
GLOVE BIOGEL PI IND STRL 6.5 (GLOVE) ×4 IMPLANT
GLOVE BIOGEL PI IND STRL 7.5 (GLOVE) ×10 IMPLANT
GLOVE BIOGEL PI IND STRL 8 (GLOVE) ×2 IMPLANT
GLOVE BIOGEL PI INDICATOR 6.5 (GLOVE) ×2
GLOVE BIOGEL PI INDICATOR 7.5 (GLOVE) ×5
GLOVE BIOGEL PI INDICATOR 8 (GLOVE) ×1
GLOVE SURG SS PI 7.5 STRL IVOR (GLOVE) ×9 IMPLANT
GOWN STRL NON-REIN LRG LVL3 (GOWN DISPOSABLE) ×15 IMPLANT
KIT BASIN OR (CUSTOM PROCEDURE TRAY) ×3 IMPLANT
KIT ROOM TURNOVER OR (KITS) ×3 IMPLANT
NEEDLE 18GX1X1/2 (RX/OR ONLY) (NEEDLE) ×3 IMPLANT
NEEDLE 22X1 1/2 (OR ONLY) (NEEDLE) ×3 IMPLANT
NEEDLE HYPO 25GX1X1/2 BEV (NEEDLE) ×3 IMPLANT
NS IRRIG 1000ML POUR BTL (IV SOLUTION) ×3 IMPLANT
PACK CV ACCESS (CUSTOM PROCEDURE TRAY) ×3 IMPLANT
PACK SURGICAL SETUP 50X90 (CUSTOM PROCEDURE TRAY) IMPLANT
PAD ARMBOARD 7.5X6 YLW CONV (MISCELLANEOUS) ×6 IMPLANT
SLEEVE SURGEON STRL (DRAPES) ×3 IMPLANT
SOAP 2 % CHG 4 OZ (WOUND CARE) ×3 IMPLANT
SPONGE GAUZE 2X2 STER 10/PKG (GAUZE/BANDAGES/DRESSINGS) ×1
SPONGE GAUZE 4X4 12PLY (GAUZE/BANDAGES/DRESSINGS) ×3 IMPLANT
SPONGE SURGIFOAM ABS GEL 100 (HEMOSTASIS) IMPLANT
SUT ETHILON 3 0 PS 1 (SUTURE) ×3 IMPLANT
SUT PROLENE 6 0 BV (SUTURE) ×9 IMPLANT
SUT VIC AB 3-0 SH 27 (SUTURE) ×2
SUT VIC AB 3-0 SH 27X BRD (SUTURE) ×4 IMPLANT
SUT VICRYL 4-0 PS2 18IN ABS (SUTURE) ×9 IMPLANT
SYR 20CC LL (SYRINGE) ×6 IMPLANT
SYR 30ML LL (SYRINGE) IMPLANT
SYR 5ML LL (SYRINGE) ×6 IMPLANT
SYR CONTROL 10ML LL (SYRINGE) ×3 IMPLANT
SYRINGE 10CC LL (SYRINGE) ×3 IMPLANT
TAPE CLOTH SURG 4X10 WHT LF (GAUZE/BANDAGES/DRESSINGS) ×3 IMPLANT
TOWEL OR 17X24 6PK STRL BLUE (TOWEL DISPOSABLE) ×9 IMPLANT
TOWEL OR 17X26 10 PK STRL BLUE (TOWEL DISPOSABLE) ×6 IMPLANT
UNDERPAD 30X30 INCONTINENT (UNDERPADS AND DIAPERS) ×3 IMPLANT
WATER STERILE IRR 1000ML POUR (IV SOLUTION) ×3 IMPLANT

## 2012-03-31 NOTE — Op Note (Signed)
NAME: April [name]   MRN: [id number] DOB: [birth year]    DATE OF OPERATION: 03/31/2012  PREOP DIAGNOSIS: poorly functioning left brachiocephalic AV fistula  POSTOP DIAGNOSIS: same  PROCEDURE:  1. Ultrasound-guided placement of right IJ 19 cm Diatek catheter 2. Revision of left brachiocephalic AV fistula (resection of redundant segment with primary anastomosis)  SURGEON: Judeth Cornfield. Scot Dock, MD, FACS  ASSIST: Leontine Locket, PA   ANESTHESIA: general   EBL: minimal  INDICATIONS: April [name] is a 57 y.o. female who has a left upper arm AV fistula. Fistulogram demonstrated a redundant segment with tortuosity making cannulation difficult. She had several infiltrates associated with her fistula. I recommended resection of the redundant segment and placement of a catheter until the fistula had healed.  FINDINGS: patent right IJ. Significant ecchymosis hematoma and induration at previously infiltrated sites of AV fistula.  TECHNIQUE: the patient was taken to the operating room and received a general anesthetic. Neck and upper chest were prepped and draped in the usual sterile fashion. Under ultrasound guidance, the right IJ was cannulated and a guidewire introduced into the superior vena cava under fluoroscopic control. Tract over the wire was dilated and then a dilator and peel-away sheath were passed over the wire the wire and dilator removed the cath was passed through the peel-away sheath and positioned in the right atrium. A second catheter was selected and the catheter brought through the tunnel. It was then cut to the appropriate length and distal ports were attached. Both ports withdrew easily with and flushed with heparinized saline and filled with concentrated heparin. The catheter was secured at its exit site with a 3-0 nylon suture. The IJ cannulation site was closed with 4-0 subcuticular stitch.  Dressing was applied.  Next the left upper extremity was prepped and draped in  the usual sterile fashion. Through 3 incisions along the fistula in the upper arm the fistula was mobilized completely. It was significantly scarred and indurated in the 2 areas that had infiltrated previously making dissection difficult. A Weber I was able to fully mobilize the vein. Area of tortuosity was identified and by mobilizing the vein I was able to retract on the vein to eliminate the redundant tortuosity. The redundant segment of vein was then excised after the patient heparinized and the vein clamped at both ends. The shoulder was then sewn end to end at the level of the middle incision with continuous 6-0 Prolene suture. At the completion and was an excellent thrill in the fistula. The wounds were then closed with a pair of 3-0 Vicryl and the skin closed with 4-0 Vicryl. Sterile dressing was applied the patient tolerated the procedure well and was transferred to recovery room in stable condition. All needle and sponge counts were correct.  Deitra Mayo, MD, FACS [facility]  DATE OF DICTATION:   03/31/2012

## 2012-03-31 NOTE — Discharge Instructions (Signed)
    03/31/2012 April [name] [id number] May 20, 1956  Surgeon(s): Angelia Mould, MD  Procedure(s): INSERTION OF DIALYSIS CATHETER REVISON OF ARTERIOVENOUS FISTULA   May stick graft immediately  x May stick graft on designated area only:   x Do not stick graft for 6 weeks-MAY STICK ABOVE AND BELOW MIDDLE INCISION.    DO NOT STICK OVER MIDDLE INCISION UNTIL AFTER DR. DICKSON SEES PT BACK.

## 2012-03-31 NOTE — Transfer of Care (Signed)
Immediate Anesthesia Transfer of Care Note  Patient: April [name]  Procedure(s) Performed: Procedure(s) (LRB): INSERTION OF DIALYSIS CATHETER (N/A) REVISON OF ARTERIOVENOUS FISTULA (Left)  Patient Location: PACU  Anesthesia Type: General  Level of Consciousness: awake and alert   Airway & Oxygen Therapy: Patient Spontanous Breathing and Patient connected to nasal cannula oxygen  Post-op Assessment: Report given to PACU RN and Post -op Vital signs reviewed and stable  Post vital signs: Reviewed  Complications: No apparent anesthesia complications

## 2012-03-31 NOTE — Anesthesia Postprocedure Evaluation (Signed)
Anesthesia Post Note  Patient: April [name]  Procedure(s) Performed: Procedure(s) (LRB): INSERTION OF DIALYSIS CATHETER (N/A) REVISON OF ARTERIOVENOUS FISTULA (Left)  Anesthesia type: general  Patient location: PACU  Post pain: Pain level controlled  Post assessment: Patient's Cardiovascular Status Stable  Last Vitals:  Filed Vitals:   03/31/12 1240  BP: 106/64  Pulse:   Temp:   Resp:     Post vital signs: Reviewed and stable  Level of consciousness: sedated  Complications: No apparent anesthesia complications

## 2012-03-31 NOTE — Telephone Encounter (Addendum)
Message copied by Doristine Section on Thu Mar 31, 2012  3:26 PM ------      Message from: [place], [place] K      Created: Thu Mar 31, 2012 11:13 AM      Regarding: schedule       AVF duplex added            ----- Message -----         From: Gabriel Earing, PA         Sent: 03/31/2012  10:57 AM           To: Mena Goes, CMA            Mccole,Annya LFemale, 56 y.o., Jul 26, 1957MRN: [id number]            She had a revision of left arm AVF.  She needs to see Dr. Scot Dock in 4 weeks.            Thanks,      Aldona Bar        notified patient of follow-up appt with csd on 05-06-12 10:45am

## 2012-03-31 NOTE — Anesthesia Procedure Notes (Signed)
Procedure Name: LMA Insertion Date/Time: 03/31/2012 8:25 AM Performed by: Chales Abrahams Pre-anesthesia Checklist: Patient identified, Timeout performed, Emergency Drugs available, Suction available and Patient being monitored Patient Re-evaluated:Patient Re-evaluated prior to inductionOxygen Delivery Method: Circle system utilized Preoxygenation: Pre-oxygenation with 100% oxygen Intubation Type: IV induction LMA: LMA inserted LMA Size: 4.0 Number of attempts: 1 Placement Confirmation: positive ETCO2 and breath sounds checked- equal and bilateral Tube secured with: Tape Dental Injury: Teeth and Oropharynx as per pre-operative assessment

## 2012-03-31 NOTE — Preoperative (Signed)
Beta Blockers   Reason not to administer Beta Blockers:Not Applicable. No home beta blockers 

## 2012-03-31 NOTE — H&P (View-Only) (Signed)
[facility] [place]  Patient name: April [name] MRN: [id number] DOB: 05-01-1956 Sex: female  REASON FOR VISIT: left upper arm AV fistula difficult to access.  HPI: April [name] is a 56 y.o. female who had a left brachiocephalic AV fistula placed in November of 2011. She began hemodialysis in March of this year. She hopes to eventually drained for home dialysis. Is an area of her fistula which has been difficult to cannulate and she had a fistulogram which showed some tortuosity of the fistula in the mid upper arm. There was also an area of stenosis that was angioplastied. She's continued to have problems with access. She currently has a unique dialysis schedule him that she dialyzes on Mondays Wednesdays and Saturdays. She's had no recent uremic symptoms. She denies nausea, vomiting, fatigue, anorexia, or palpitations.   REVIEW OF SYSTEMS: Valu.Nieves ] denotes positive finding; [  ] denotes negative finding  CARDIOVASCULAR:  [ ]  chest pain   [ ]  dyspnea on exertion    CONSTITUTIONAL:  [ ]  fever   [ ]  chills  PHYSICAL EXAM: Filed Vitals:   03/23/12 0857  BP: 118/67  Pulse: 95  Temp: 98.2 F (36.8 C)  TempSrc: Oral  Height: 5\' 1"  (1.549 m)  Weight: 183 lb (83.008 kg)   Body mass index is 34.58 kg/(m^2). GENERAL: The patient is a well-nourished female, in no acute distress. The vital signs are documented above. CARDIOVASCULAR: There is a regular rate and rhythm  PULMONARY: There is good air exchange bilaterally without wheezing or rales. Her fistula in the left upper arm appears to be low large in size. It has an excellent thrill. 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 She has a palpable left radial pulse.  I have reviewed her fistulogram which shows an area of tortuosity in the midportion of the vein. There were also several small branches which are noted.  MEDICAL ISSUES: Given the problems that she is having with accessing her  fistula I think the best way to attempt to salvage the fistula would be to fully mobilize the vein and then tunnel it such that this would straighten out the area of tortuosity. Given the aneurysm at the proximal anastomosis if there is enough length we could potentially excise this and reconnect the vein here. We'll also ligate the competing branches. I think this would be the best solution to her problem. This would require that the fistula not be access for several weeks and therefore we also have to place a dietetic catheter. Have discussed the procedure potential complications with the patient and she is agreeable to proceed. Her surgery scheduled for 03/31/2012.  [place] [facility] [place] Beeper: [phone]

## 2012-03-31 NOTE — Anesthesia Preprocedure Evaluation (Addendum)
Anesthesia Evaluation  Patient identified by MRN, date of birth, ID band Patient awake    Reviewed: Allergy & Precautions, H&P , NPO status , Patient's Chart, lab work & pertinent test results, reviewed documented beta blocker date and time   History of Anesthesia Complications (+) PONV  Airway Mallampati: II TM Distance: >3 FB Neck ROM: Full    Dental  (+) Teeth Intact and Dental Advisory Given   Pulmonary asthma ,          Cardiovascular     Neuro/Psych PSYCHIATRIC DISORDERS Depression    GI/Hepatic GERD-  Medicated and Controlled,  Endo/Other  Morbid obesity  Renal/GU      Musculoskeletal   Abdominal (+) + obese,   Peds  Hematology   Anesthesia Other Findings   Reproductive/Obstetrics                         Anesthesia Physical Anesthesia Plan  ASA: III  Anesthesia Plan: MAC   Post-op Pain Management:    Induction: Intravenous  Airway Management Planned: Nasal Cannula  Additional Equipment:   Intra-op Plan:   Post-operative Plan:   Informed Consent: I have reviewed the patients History and Physical, chart, labs and discussed the procedure including the risks, benefits and alternatives for the proposed anesthesia with the patient or authorized representative who has indicated his/her understanding and acceptance.   Dental advisory given  Plan Discussed with: CRNA and Surgeon  Anesthesia Plan Comments:        Anesthesia Quick Evaluation

## 2012-03-31 NOTE — Interval H&P Note (Signed)
History and Physical Interval Note:  03/31/2012 8:10 AM  April [name]  has presented today for surgery, with the diagnosis of End Stage Renal Disease  The various methods of treatment have been discussed with the patient and family. After consideration of risks, benefits and other options for treatment, the patient has consented to: [place] OF LEFT ARTERIOVENOUS FISTULA.  The patients' history has been reviewed, patient examined, no change in status, stable for surgery.  I have reviewed the patients' chart and labs.  Questions were answered to the patient's satisfaction.     [name] S

## 2012-03-31 NOTE — OR Nursing (Signed)
First procedure insertion of dialysis catheter ended at 0858,second procedure revision of left upper arm arteriovenous fistula started at 0912.

## 2012-04-01 LAB — POCT I-STAT 4, (NA,K, GLUC, HGB,HCT)
HCT: 37 % (ref 36.0–46.0)
Hemoglobin: 12.6 g/dL (ref 12.0–15.0)

## 2012-04-04 ENCOUNTER — Encounter (HOSPITAL_COMMUNITY): Payer: Self-pay | Admitting: Vascular Surgery

## 2012-04-26 IMAGING — CR DG CHEST 2V
2 series · 2 of 2 positions shown · non-contrast
Comparison: 01/29/2010

CLINICAL DATA: Preop

CHEST - 2 VIEW

[view not recorded (1 of 2)]
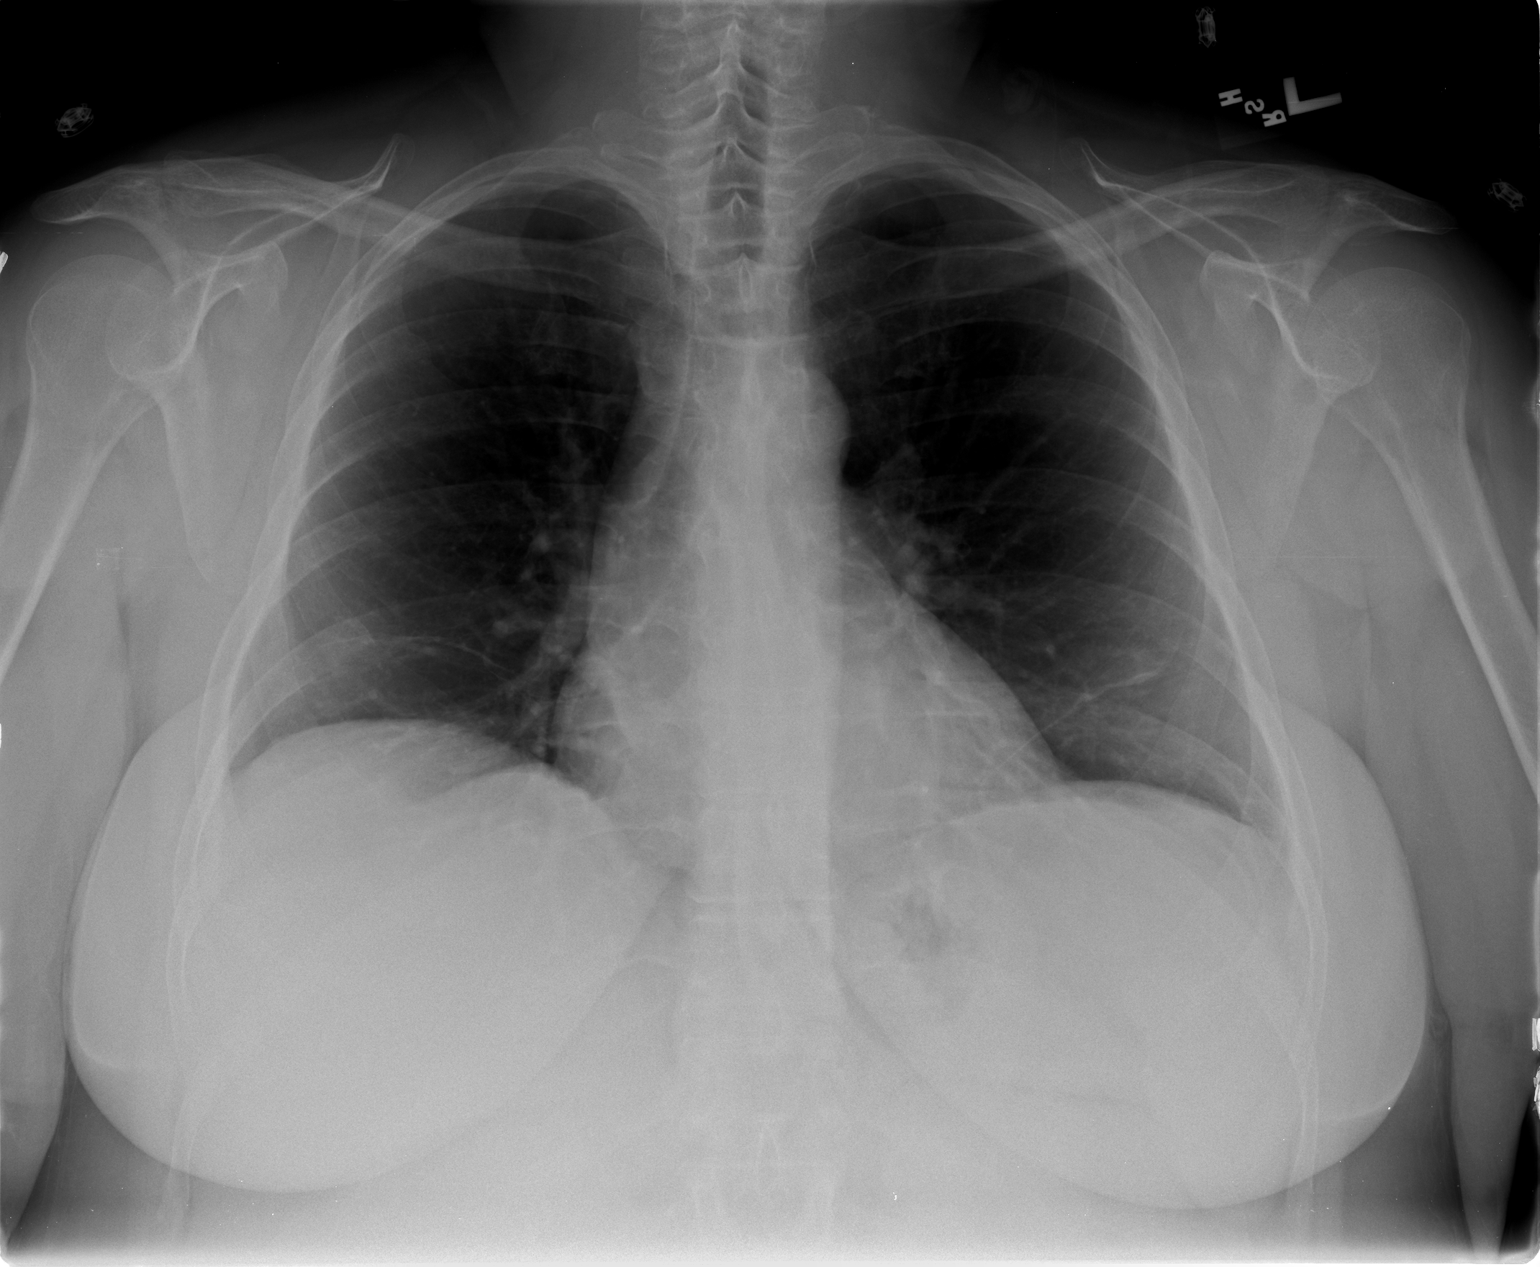

[view not recorded (2 of 2)]
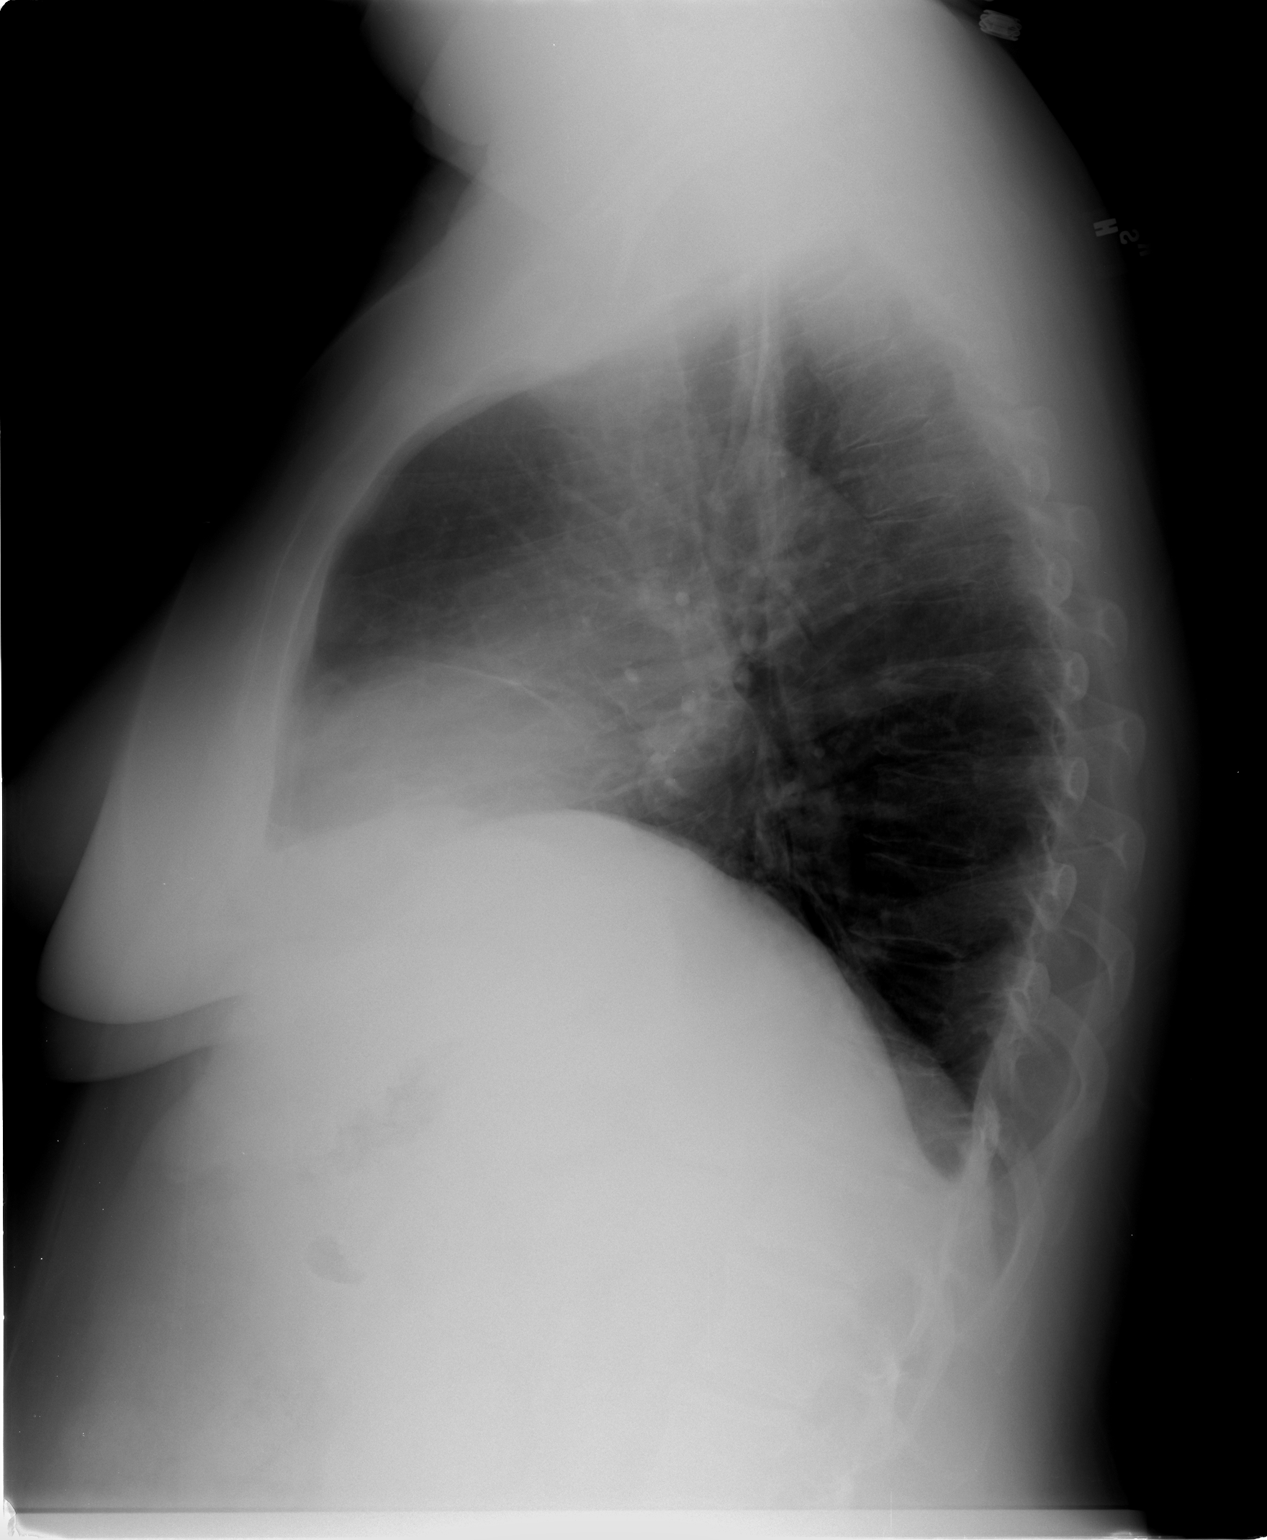

[2 of 2 positions shown; findings below may reference images not displayed]

FINDINGS: Cardiomediastinal silhouette is stable.  No acute
infiltrate or edema .Minimal degenerative changes mid thoracic
spine.  Linear atelectasis or scarring bilateral basilar is stable.
IMPRESSION: No active disease.  No significant change.

## 2012-05-05 ENCOUNTER — Encounter: Payer: Self-pay | Admitting: Vascular Surgery

## 2012-05-06 ENCOUNTER — Ambulatory Visit (INDEPENDENT_AMBULATORY_CARE_PROVIDER_SITE_OTHER): Payer: 59 | Admitting: Vascular Surgery

## 2012-05-06 ENCOUNTER — Encounter: Payer: Self-pay | Admitting: Vascular Surgery

## 2012-05-06 VITALS — BP 110/71 | HR 81 | Temp 98.2°F | Ht 61.5 in | Wt 182.7 lb

## 2012-05-06 DIAGNOSIS — N186 End stage renal disease: Secondary | ICD-10-CM

## 2012-05-06 NOTE — Progress Notes (Signed)
[facility] [place]  Postoperative Access Visit  History of Present Illness  April [name] is a 56 y.o. year old female who presents for postoperative follow-up for: L BC AVF revision by Dr. Scot Dock (Date: 03/31/12).  The patient's wounds are healed.  The patient notes no steal symptoms.  The patient is able to complete their activities of daily living.  The patient's current symptoms are: none.  Physical Examination  Filed Vitals:   05/06/12 1053  BP: 110/71  Pulse: 81  Temp: 98.2 F (36.8 C)   LUE: Incision is healed, skin feels warm, hand grip is 5/5, sensation in digits is intact, palpable thrill, bruit can be auscultated, On Sonosite: AVF is widely patent throughout, >6 mm throughout, at some locations > 6 mm deep, but there are two areas where the fistula are easily palpable  Medical Decision Making  April [name] is a 56 y.o. year old female who presents s/p L BC AVF revision.  The patient's access is  ready for use   The patient's tunneled dialysis catheter can be removed after two successful cannulations and completed dialysis treatments.  Thank you for allowing [place] to participate in this patient's care.  Adele Barthel, MD [facility] [place] Office: [phone] Pager: [phone]

## 2012-05-18 ENCOUNTER — Other Ambulatory Visit: Payer: Self-pay | Admitting: *Deleted

## 2012-05-19 ENCOUNTER — Encounter: Payer: Self-pay | Admitting: Physician Assistant

## 2012-05-19 ENCOUNTER — Encounter (HOSPITAL_COMMUNITY)
Admission: RE | Admit: 2012-05-19 | Discharge: 2012-05-19 | Disposition: A | Discharge status: Home / Self Care | Payer: 59 | Source: Ambulatory Visit | Attending: Vascular Surgery | Admitting: Vascular Surgery

## 2012-05-19 DIAGNOSIS — Z452 Encounter for adjustment and management of vascular access device: Secondary | ICD-10-CM | POA: Insufficient documentation

## 2012-05-19 DIAGNOSIS — N186 End stage renal disease: Secondary | ICD-10-CM

## 2012-05-19 NOTE — Progress Notes (Signed)
Patient ID: April [name], female   DOB: February 25, 1956, 56 y.o.   MRN: [id number] [facility] H&P  CC:  Catheter removal   HPI:  56 year old female with a Working Left arm AV fistula comes in today for diatek catheter removal.  She is currently in dialysis.  Past Medical History  Diagnosis Date  . Polycystic kidney   . Hyperlipidemia   . GERD (gastroesophageal reflux disease)   . Arthritis   . Depression   . PONV (postoperative nausea and vomiting)   . Asthma   . Hyperparathyroidism     FH:  Non-Contributory  History   Social History  . Marital Status: Married    Spouse Name: N/A    Number of Children: N/A  . Years of Education: N/A   Occupational History  . Not on file.   Social History Main Topics  . Smoking status: Former Smoker    Quit date: 03/22/1990  . Smokeless tobacco: Never Used  . Alcohol Use: No  . Drug Use: No  . Sexually Active:    Other Topics Concern  . Not on file   Social History Narrative  . No narrative on file    Allergies  Allergen Reactions  . Infed (Iron Dextran) Other (See Comments)    Chest tightness  . Erythromycin (Erythromycin) Other (See Comments)    Mouth Ulcers  . Ultram (Tramadol Hcl) Anxiety    Current Outpatient Prescriptions  Medication Sig Dispense Refill  . albuterol (PROVENTIL HFA;VENTOLIN HFA) 108 (90 BASE) MCG/ACT inhaler Inhale 2 puffs into the lungs every 6 (six) hours as needed. For shortness of breath      . allopurinol (ZYLOPRIM) 100 MG tablet Take 100 mg by mouth 2 (two) times daily.      . B Complex-C-Folic Acid (DIALYVITE TABLET) TABS Take 1 tablet by mouth daily.       . citalopram (CELEXA) 10 MG tablet Take 5 mg by mouth daily.      . clonazePAM (KLONOPIN) 0.5 MG tablet Take 0.25 mg by mouth 2 (two) times daily as needed. For anxiety      . COLCRYS 0.6 MG tablet       . ethyl chloride spray       . fexofenadine (ALLEGRA) 30 MG tablet Take 30 mg by mouth daily as needed. For  allergies      . fluticasone-salmeterol (ADVAIR HFA) 45-21 MCG/ACT inhaler Inhale 2 puffs into the lungs 2 (two) times daily as needed. For wheezing due to seasonal allergies      . indomethacin (INDOCIN) 25 MG capsule       . mometasone (NASONEX) 50 MCG/ACT nasal spray Place 2 sprays into the nose daily as needed. For congestion      . oxycodone (OXY-IR) 5 MG capsule       . pantoprazole (PROTONIX) 40 MG tablet Take 40 mg by mouth daily.      . paricalcitol (ZEMPLAR) 2 MCG capsule Take 2 mcg by mouth. Three times per week      . RENVELA 800 MG tablet Take 1,600-2,400 mg by mouth 5 (five) times daily as needed. With each meal and snack. 1600mg  with snacks, and 2400mg  with meals      . SENSIPAR 60 MG tablet Take 30 mg by mouth daily.       . sodium chloride 0.9 % SOLN 19 mL with heparin 1000 UNIT/ML SOLN 1,000 Units 2-20 Units/kg/hr by CRRT route. 8 ml q dialysis  day        ROS:  See HPI  PHYSICAL EXAM  There were no vitals filed for this visit.  Gen:  WDWN HEENT:  PERRL Neck:  Supple palpable right IJ diatek catheter Heart:  RRR Lungs:  CTA Abdomen:  soft Extremities:  Moves all extremities Skin:  Ecchymotic left fistula area non tender. Palpable radial pulses bilateral N/V/M intact bilateral upper extremities.  Lab/X-ray:  Impression: This is a 56 y.o. female here for diatek catheter removal  Plan:  Removal of right diatek catheter  Leontine Locket, PA-C [facility] [phone] 05/19/2012 1:09 PM    [facility] Catheter Removal Procedure Note  Diagnosis: ESRD with Functioning AVF/AVGG  Plan:  Remove right diatek catheter  Consent signed:  yes Time out completed:  yes Coumadin:  no PT/INR (if applicable):   Other labs:   Procedure: 1.  Sterile prepping and draping over catheter area 2. 0 ml 2% lidocaine plain instilled at removal site. 3.  right catheter removed in its entirety with cuff in tact. 4.  Complications: none  5.  Tip of catheter sent for culture:  no   Patient tolerated procedure well:  yes Pressure held, no bleeding noted, dressing applied Instructions given to the pt regarding wound care and bleeding.  OtherLaurence Slate [facility] 05/19/2012 1:10 PM

## 2012-05-19 NOTE — Progress Notes (Signed)
Discharged home; right subclavian dressing CDI; denies pain, discomfort,or SOB

## 2012-06-01 DIAGNOSIS — M1990 Unspecified osteoarthritis, unspecified site: Secondary | ICD-10-CM | POA: Insufficient documentation

## 2012-06-01 DIAGNOSIS — M109 Gout, unspecified: Secondary | ICD-10-CM | POA: Insufficient documentation

## 2012-06-01 DIAGNOSIS — Q612 Polycystic kidney, adult type: Secondary | ICD-10-CM

## 2012-06-02 DIAGNOSIS — Z940 Kidney transplant status: Secondary | ICD-10-CM

## 2012-06-02 DIAGNOSIS — D849 Immunodeficiency, unspecified: Secondary | ICD-10-CM | POA: Insufficient documentation

## 2012-06-05 DIAGNOSIS — R739 Hyperglycemia, unspecified: Secondary | ICD-10-CM | POA: Insufficient documentation

## 2012-06-07 DIAGNOSIS — K219 Gastro-esophageal reflux disease without esophagitis: Secondary | ICD-10-CM | POA: Diagnosis present

## 2012-08-15 DIAGNOSIS — M25551 Pain in right hip: Secondary | ICD-10-CM | POA: Insufficient documentation

## 2012-10-17 IMAGING — CT CT ABD-PELV W/O CM
2 of 4 series · 17 of 46 positions shown, 19 images · non-contrast
Comparison: CT scan of the abdomen dated 03/27/2009

CLINICAL DATA: Right flank pain and right upper quadrant abdominal
pain.  Nausea.  History of polycystic kidney disease.

CT ABDOMEN AND PELVIS WITHOUT CONTRAST
TECHNIQUE: Multidetector CT imaging of the abdomen and pelvis was
performed following the standard protocol without intravenous
contrast.

[Series 2: renal stone w/o · axial · non-contrast · 0.80mm/px · z∈[-420,+0]mm · 14 of 92 slices shown, 16 images]
[im 4/92  soft-tissue]
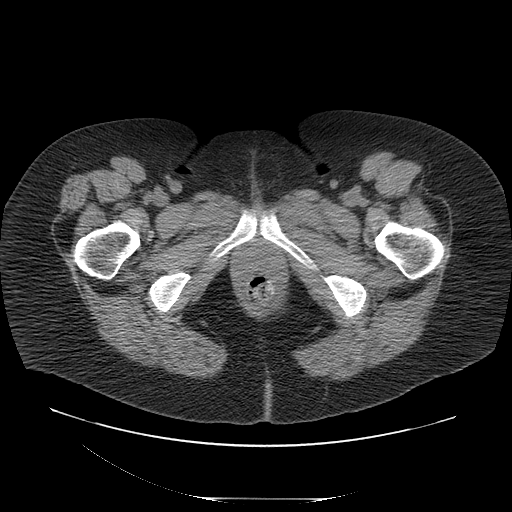
[im 4/92  bone]
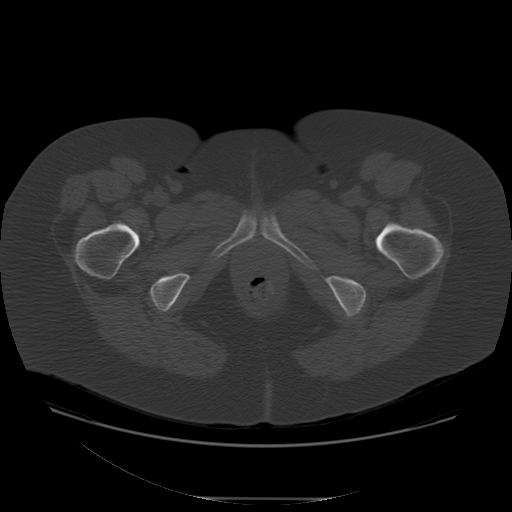
[im 12/92  soft-tissue]
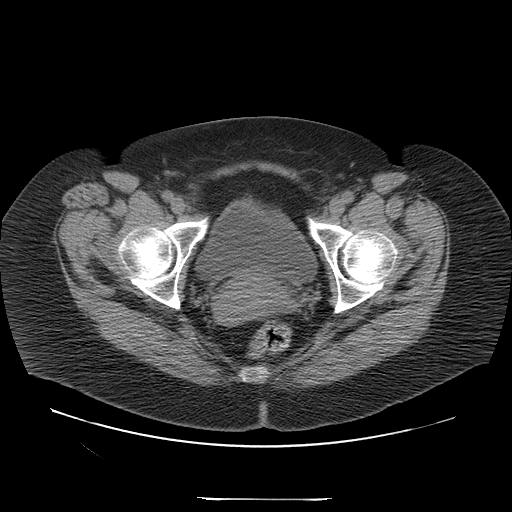
[im 19/92  soft-tissue]
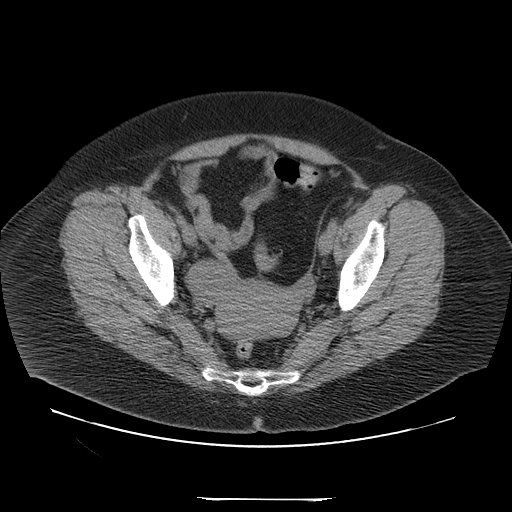
[im 23/92  soft-tissue]
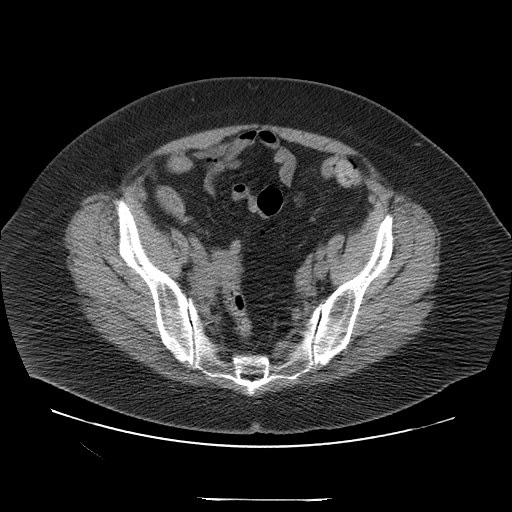
[im 31/92  soft-tissue]
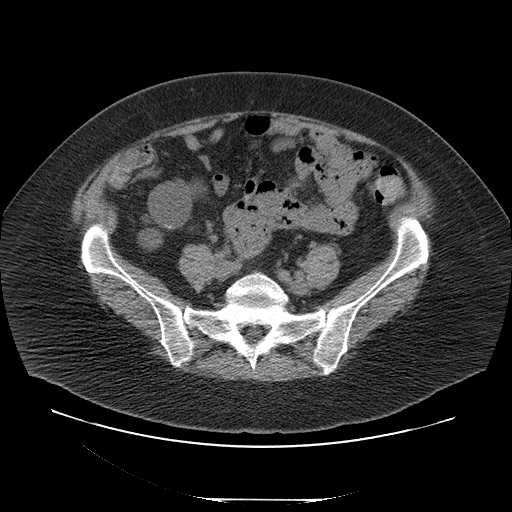
[im 38/92  soft-tissue]
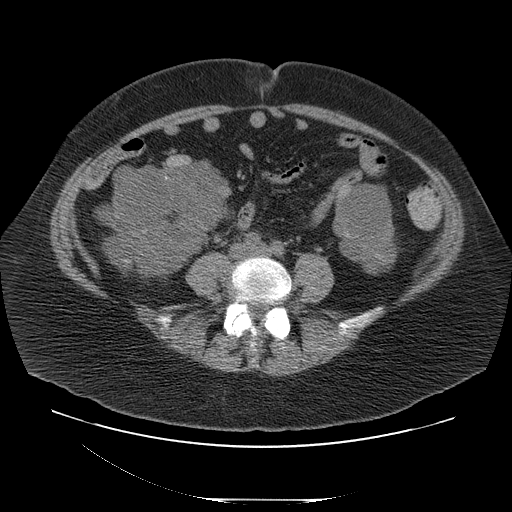
[im 42/92  soft-tissue]
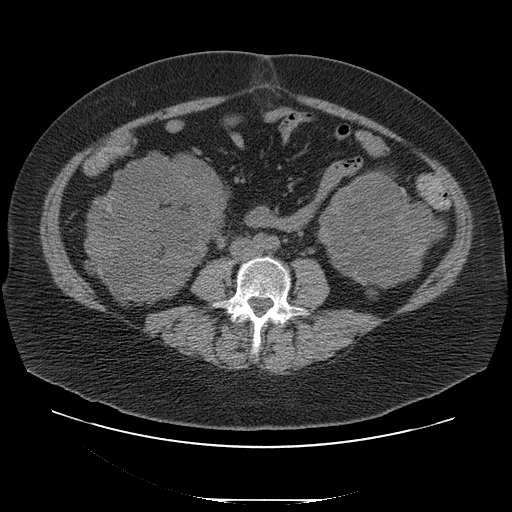
[im 50/92  soft-tissue]
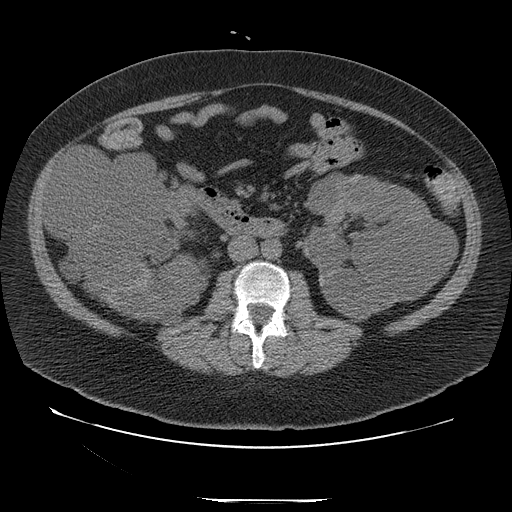
[im 54/92  soft-tissue]
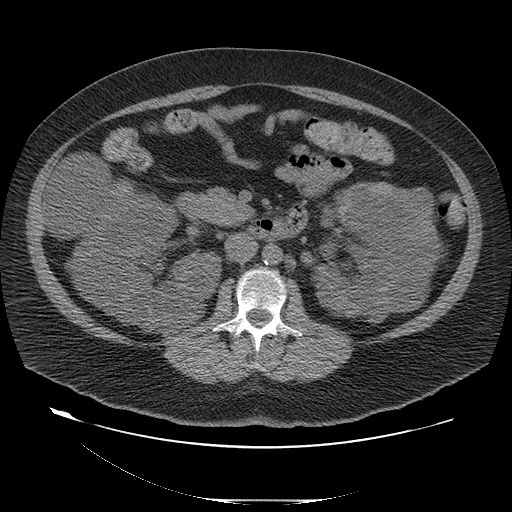
[im 54/92  bone]
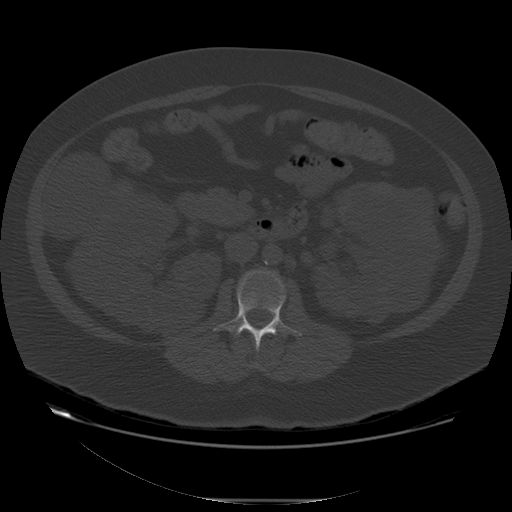
[im 61/92  soft-tissue]
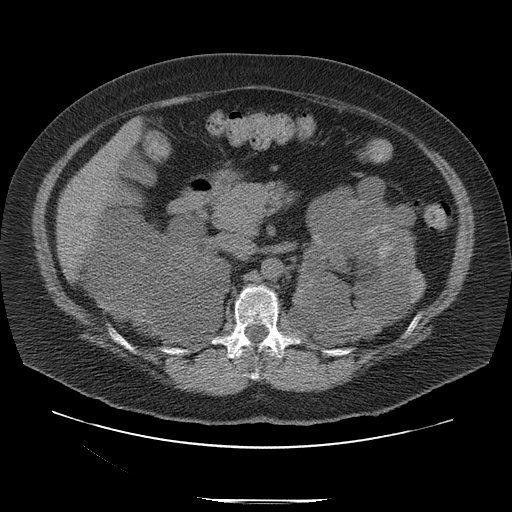
[im 69/92  soft-tissue]
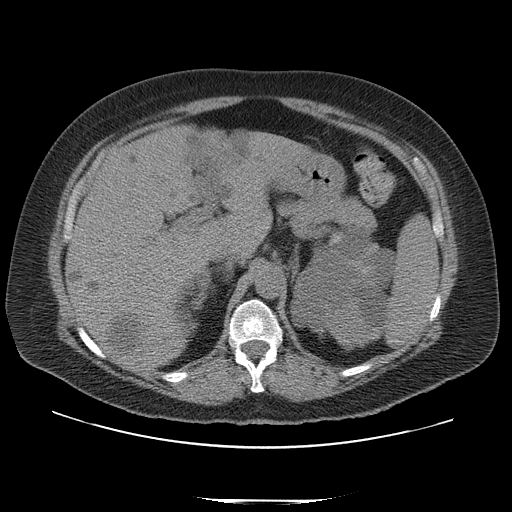
[im 73/92  soft-tissue]
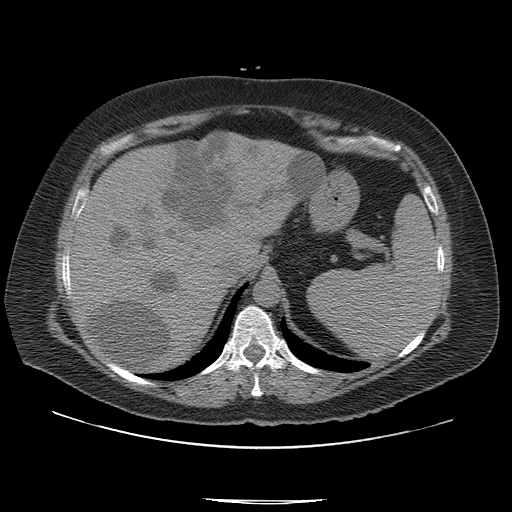
[im 80/92  soft-tissue]
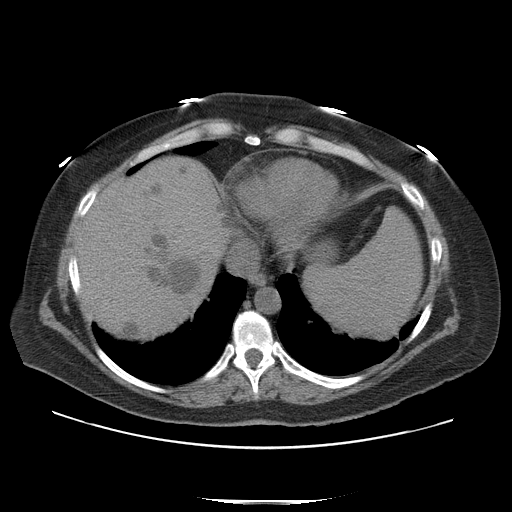
[im 88/92  soft-tissue]
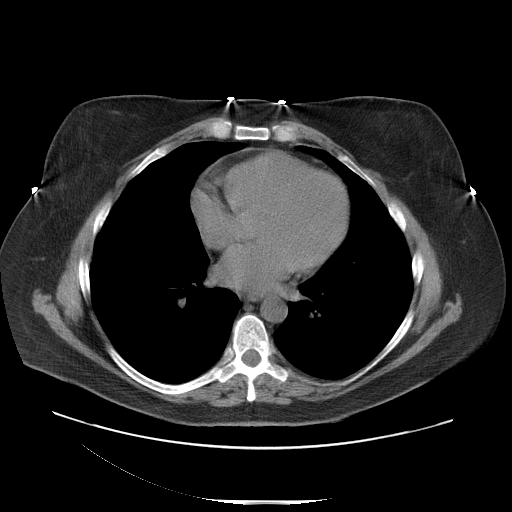

[Series 400: coronals · coronal · 0.93mm/px · 3 of 132 slices shown]
[im 44/132  soft-tissue]
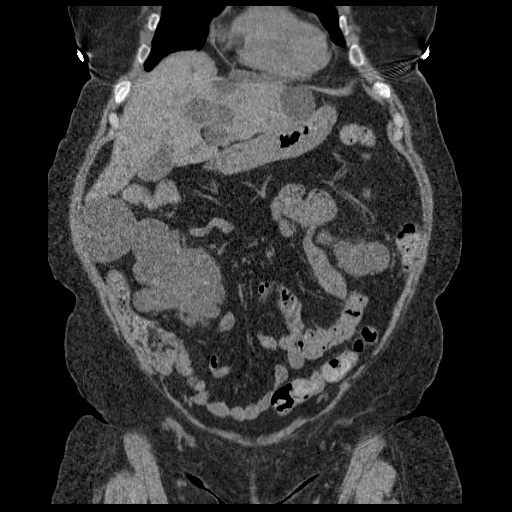
[im 59/132  soft-tissue]
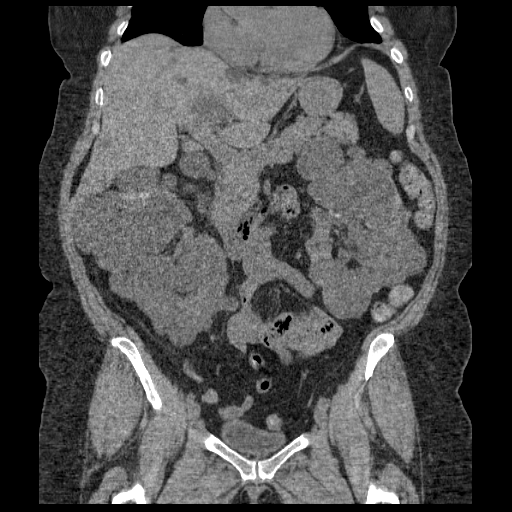
[im 73/132  soft-tissue]
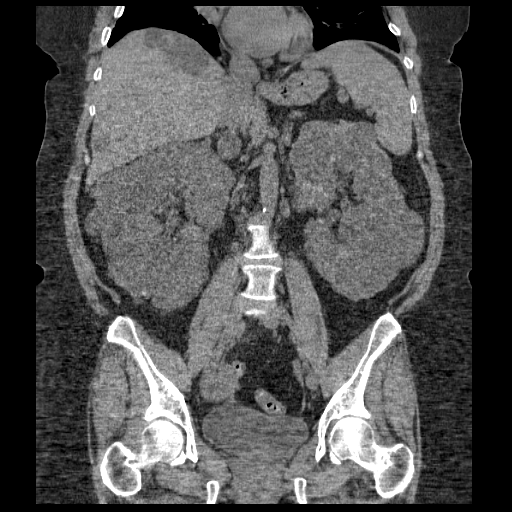

[17 of 46 positions shown; findings below may reference images not displayed]

FINDINGS: There is a new 4.3 cm mass on the right ovary.  The left
ovary and uterus are normal.

There are numerous cysts in the liver and both kidneys consistent
with polycystic disease.  Many of the cysts in both kidneys are
hyperdense consistent with proteinaceous cysts.  No discrete
worrisome lesions in the kidneys. No hydronephrosis.

There is no bile duct dilatation.  The gallbladder is not
distended.

The spleen and the pancreas and adrenal glands are normal.  No
adenopathy.

The bowel is normal including the terminal ileum and appendix.  No
significant osseous abnormality.

There is a small periumbilical hernia containing only fat,
minimally more prominent than on the prior exam.
IMPRESSION: 1.  New 4.3 cm lesion on the right ovary, indeterminate.  Pelvic
ultrasound may be useful for further evaluation.
2.  Polycystic disease involving the kidneys and liver, minimally
progressed in the kidneys.
3.  Tiny periumbilical hernia containing only fat.

## 2012-10-20 IMAGING — US US TRANSVAGINAL NON-OB
1 series · 13 of 25 positions shown · non-contrast
Comparison: CT scan 04/13/2011.

CLINICAL DATA: Follow-up right ovarian lesion.



[Series 1: us transvaginal non-ob · 0.33mm/px · 13 of 75 slices shown]
[im 1/75]
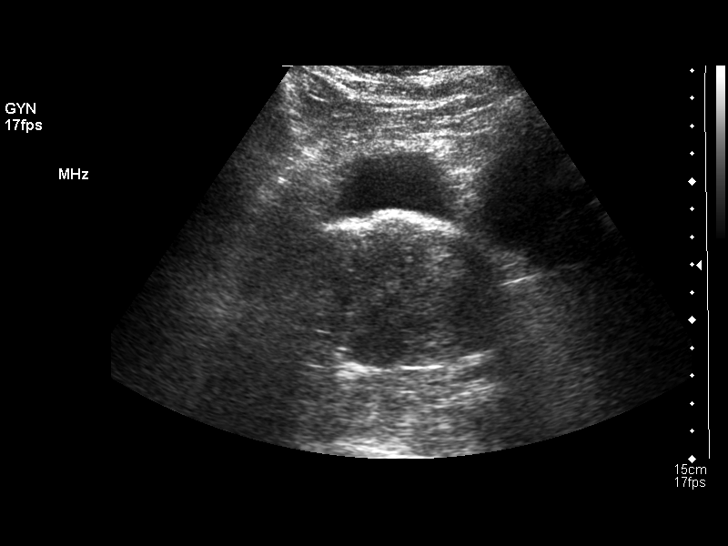
[im 7/75]
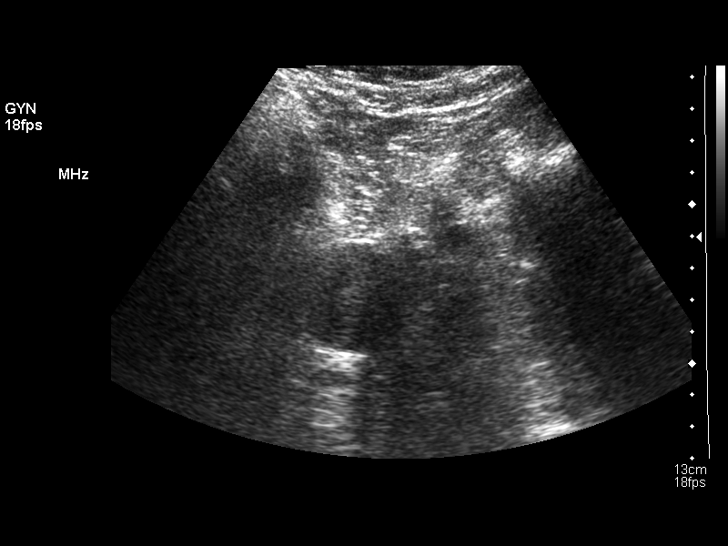
[im 13/75]
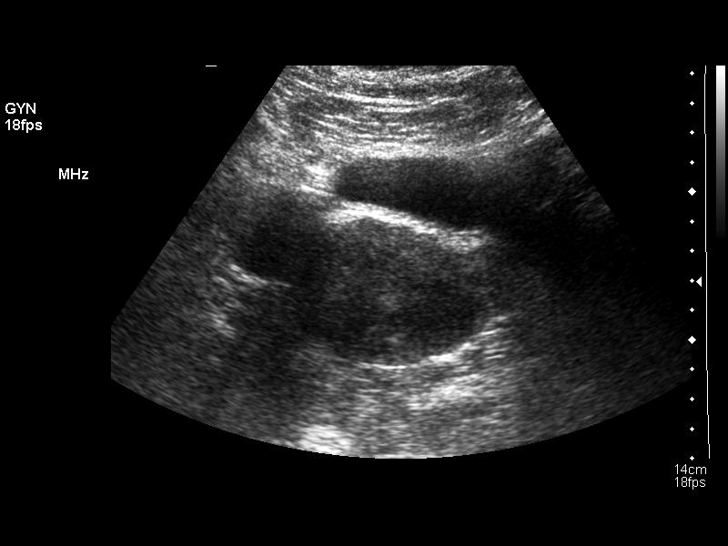
[im 19/75]
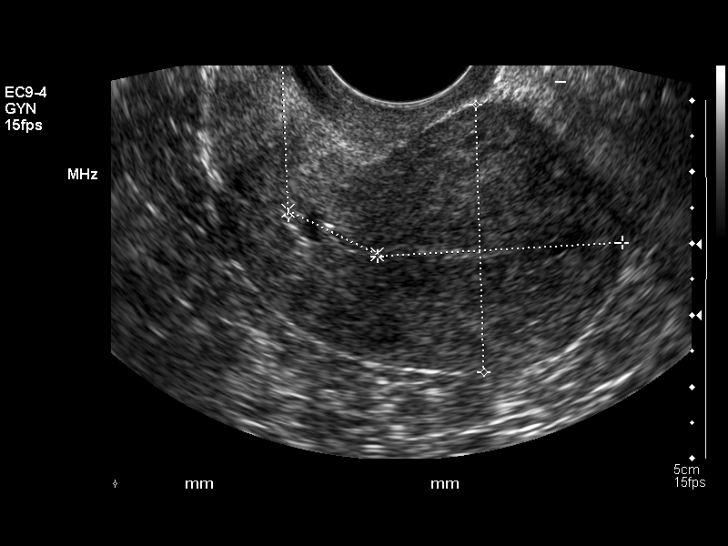
[im 25/75]
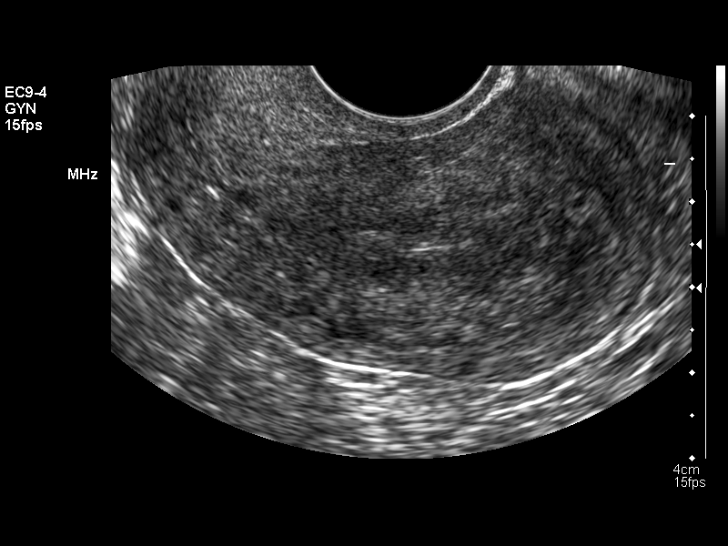
[im 31/75]
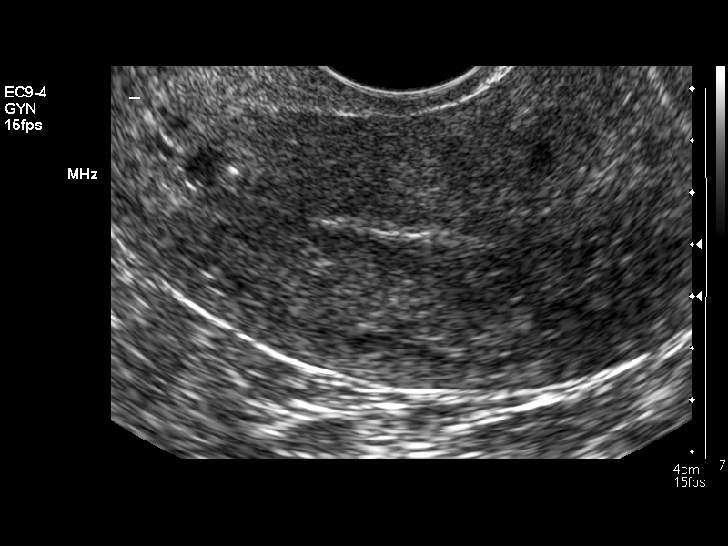
[im 38/75]
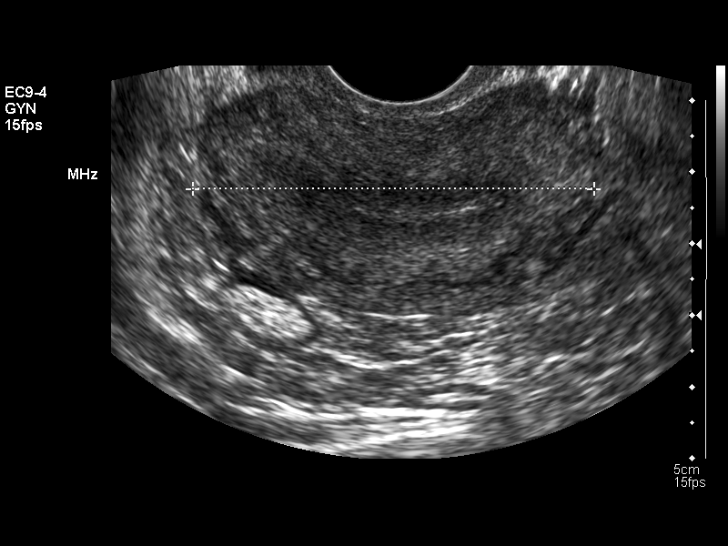
[im 44/75]
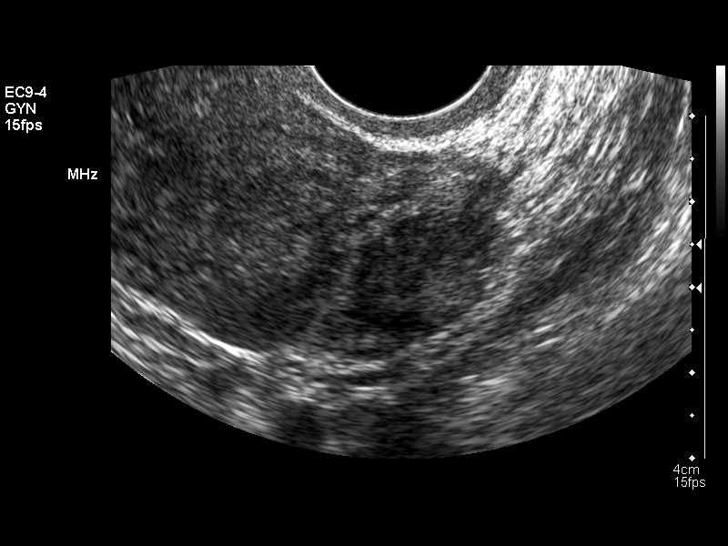
[im 50/75]
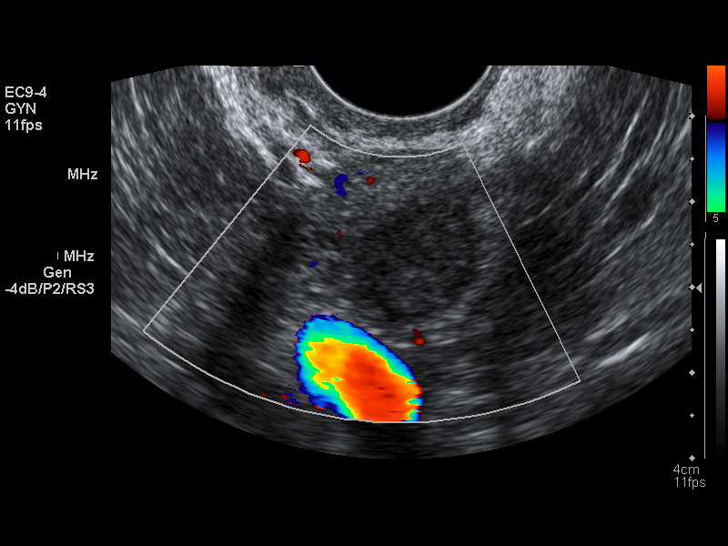
[im 56/75]
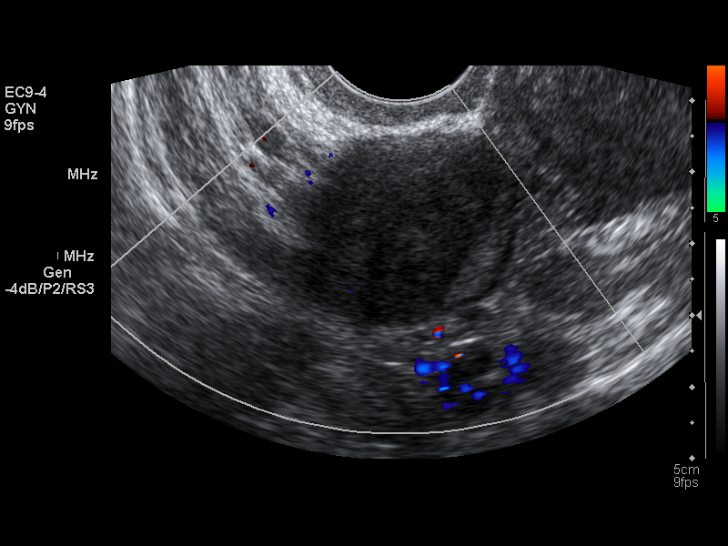
[im 62/75]
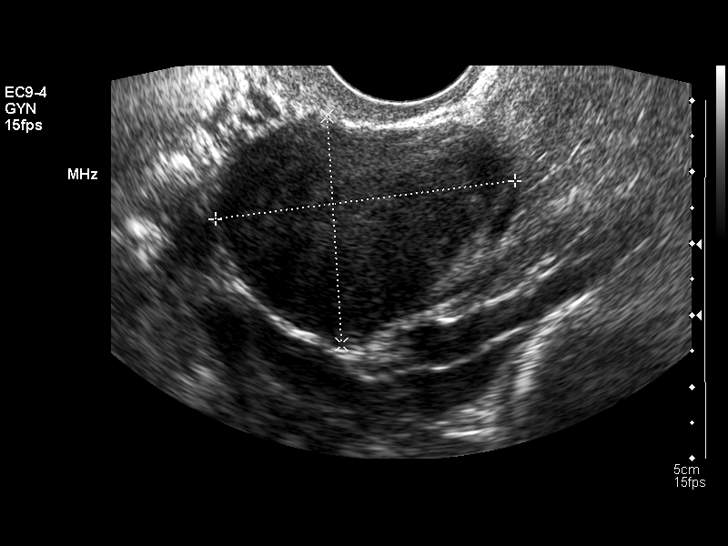
[im 68/75]
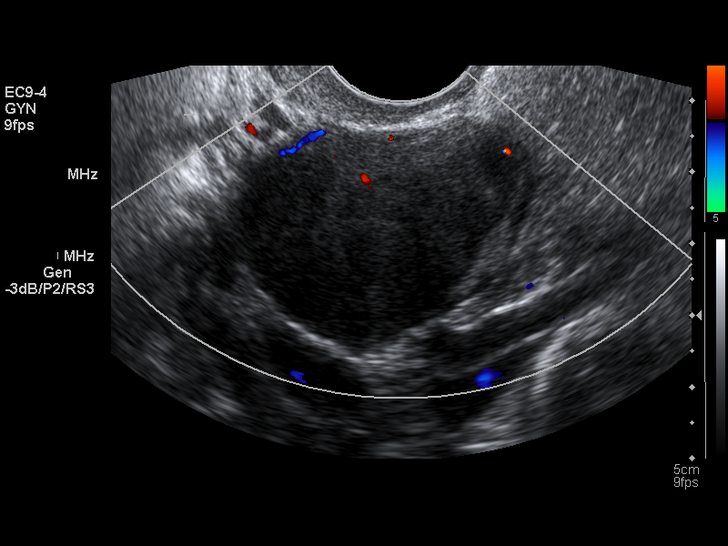
[im 75/75]
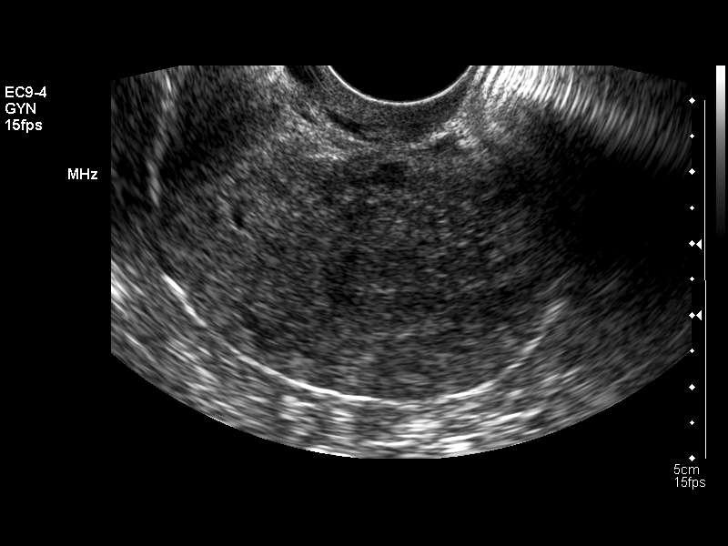

[13 of 25 positions shown; findings below may reference images not displayed]

FINDINGS: Uterus:  Measures 7.2 x 3.7 x 5.6 cm.  The uterus is retroverted.
Several small fibroids are noted.

Endometrium:  Normal in thickness measuring 2.5 mm.

Right ovary:  Measures 4.2 x 3.2 x 3.7 cm.  There is a solid
appearing mixed echogenic lesion associated with the right ovary
which measures 4.0 x 3.1 x 3.2 cm.

Left ovary:  Measures 1.9 x 1.2 x 1.6 cm.  Normal appearance/no
adnexal mass.

Other findings:  No free fluid.
IMPRESSION: 1.  Small uterine fibroids.
2.  Normal endometrium.
3.  Solid appearing 4.0 x 3.1 x 3.2 cm lesion associated with the
right ovary.  MRI of the pelvis without and with contrast may be
helpful for further evaluation and better characterization.

## 2013-04-04 ENCOUNTER — Emergency Department (HOSPITAL_COMMUNITY): Payer: 59

## 2013-04-04 ENCOUNTER — Encounter (HOSPITAL_COMMUNITY): Payer: Self-pay | Admitting: *Deleted

## 2013-04-04 ENCOUNTER — Emergency Department (HOSPITAL_COMMUNITY)
Admission: EM | Admit: 2013-04-04 | Discharge: 2013-04-04 | Disposition: A | Discharge status: Home / Self Care | Payer: 59 | Attending: Emergency Medicine | Admitting: Emergency Medicine

## 2013-04-04 DIAGNOSIS — Z7982 Long term (current) use of aspirin: Secondary | ICD-10-CM | POA: Insufficient documentation

## 2013-04-04 DIAGNOSIS — T148XXA Other injury of unspecified body region, initial encounter: Secondary | ICD-10-CM

## 2013-04-04 DIAGNOSIS — K219 Gastro-esophageal reflux disease without esophagitis: Secondary | ICD-10-CM | POA: Insufficient documentation

## 2013-04-04 DIAGNOSIS — Z79899 Other long term (current) drug therapy: Secondary | ICD-10-CM | POA: Insufficient documentation

## 2013-04-04 DIAGNOSIS — Z8639 Personal history of other endocrine, nutritional and metabolic disease: Secondary | ICD-10-CM | POA: Insufficient documentation

## 2013-04-04 DIAGNOSIS — Z87448 Personal history of other diseases of urinary system: Secondary | ICD-10-CM | POA: Insufficient documentation

## 2013-04-04 DIAGNOSIS — J45909 Unspecified asthma, uncomplicated: Secondary | ICD-10-CM | POA: Insufficient documentation

## 2013-04-04 DIAGNOSIS — IMO0002 Reserved for concepts with insufficient information to code with codable children: Secondary | ICD-10-CM | POA: Insufficient documentation

## 2013-04-04 DIAGNOSIS — Z862 Personal history of diseases of the blood and blood-forming organs and certain disorders involving the immune mechanism: Secondary | ICD-10-CM | POA: Insufficient documentation

## 2013-04-04 DIAGNOSIS — M62838 Other muscle spasm: Secondary | ICD-10-CM | POA: Insufficient documentation

## 2013-04-04 LAB — CBC WITH DIFFERENTIAL/PLATELET
HCT: 46.9 % — ABNORMAL HIGH (ref 36.0–46.0)
Hemoglobin: 15.7 g/dL — ABNORMAL HIGH (ref 12.0–15.0)
Lymphs Abs: 0.9 10*3/uL (ref 0.7–4.0)
MCH: 32.7 pg (ref 26.0–34.0)
Monocytes Relative: 8 % (ref 3–12)
Neutro Abs: 6.4 10*3/uL (ref 1.7–7.7)
Neutrophils Relative %: 80 % — ABNORMAL HIGH (ref 43–77)
RBC: 4.8 MIL/uL (ref 3.87–5.11)

## 2013-04-04 LAB — COMPREHENSIVE METABOLIC PANEL
Alkaline Phosphatase: 120 U/L — ABNORMAL HIGH (ref 39–117)
BUN: 29 mg/dL — ABNORMAL HIGH (ref 6–23)
Chloride: 103 mEq/L (ref 96–112)
GFR calc Af Amer: 52 mL/min — ABNORMAL LOW (ref >90)
GFR calc non Af Amer: 45 mL/min — ABNORMAL LOW (ref >90)
Glucose, Bld: 106 mg/dL — ABNORMAL HIGH (ref 70–99)
Potassium: 4.4 mEq/L (ref 3.5–5.1)
Total Bilirubin: 1 mg/dL (ref 0.3–1.2)

## 2013-04-04 LAB — URINALYSIS, ROUTINE W REFLEX MICROSCOPIC
Ketones, ur: NEGATIVE mg/dL
Leukocytes, UA: NEGATIVE
Nitrite: NEGATIVE
Protein, ur: NEGATIVE mg/dL

## 2013-04-04 MED ORDER — DIAZEPAM 5 MG/ML IJ SOLN
5.0000 mg | Freq: Once | INTRAMUSCULAR | Status: AC
  Administered 2013-04-04: 5 mg via INTRAVENOUS
  Filled 2013-04-04: qty 2

## 2013-04-04 MED ORDER — KETOROLAC TROMETHAMINE 30 MG/ML IJ SOLN
30.0000 mg | Freq: Once | INTRAMUSCULAR | Status: DC
  Filled 2013-04-04: qty 1

## 2013-04-04 MED ORDER — HYDROCODONE-ACETAMINOPHEN 5-325 MG PO TABS: 1.0000 | ORAL_TABLET | ORAL | Status: DC | PRN

## 2013-04-04 MED ORDER — MORPHINE SULFATE 4 MG/ML IJ SOLN
4.0000 mg | Freq: Once | INTRAMUSCULAR | Status: AC
  Administered 2013-04-04: 4 mg via INTRAVENOUS
  Filled 2013-04-04: qty 1

## 2013-04-04 MED ORDER — SODIUM CHLORIDE 0.9 % IV BOLUS (SEPSIS)
1000.0000 mL | Freq: Once | INTRAVENOUS | Status: AC
  Administered 2013-04-04: 1000 mL via INTRAVENOUS

## 2013-04-04 MED ORDER — DIAZEPAM 5 MG PO TABS: 5.0000 mg | ORAL_TABLET | Freq: Two times a day (BID) | ORAL | Status: DC

## 2013-04-04 NOTE — ED Provider Notes (Signed)
Medical screening examination/treatment/procedure(s) were performed by non-physician practitioner and as supervising physician I was immediately available for consultation/collaboration.  Kalman Drape, MD 04/04/13 Tresa Moore

## 2013-04-04 NOTE — ED Provider Notes (Signed)
Patient originally seen by Patria Mane, PA-C, who signed the patient out to me at shift and off. Patient presents to the emergency department with chief complaint of left-sided flank pain. Patient is he renal transplantation, and wants to be evaluated for pneumonia. She states that she might be "ultra cautious." The plan at signout, was to followup on chest x-ray, which if negative, will discharge the patient to home with primary care followup.  Results for orders placed during the hospital encounter of 04/04/13  CBC WITH DIFFERENTIAL      Result Value Range   WBC 8.0  4.0 - 10.5 K/uL   RBC 4.80  3.87 - 5.11 MIL/uL   Hemoglobin 15.7 (*) 12.0 - 15.0 g/dL   HCT 46.9 (*) 36.0 - 46.0 %   MCV 97.7  78.0 - 100.0 fL   MCH 32.7  26.0 - 34.0 pg   MCHC 33.5  30.0 - 36.0 g/dL   RDW 15.9 (*) 11.5 - 15.5 %   Platelets 141 (*) 150 - 400 K/uL   Neutrophils Relative 80 (*) 43 - 77 %   Neutro Abs 6.4  1.7 - 7.7 K/uL   Lymphocytes Relative 11 (*) 12 - 46 %   Lymphs Abs 0.9  0.7 - 4.0 K/uL   Monocytes Relative 8  3 - 12 %   Monocytes Absolute 0.6  0.1 - 1.0 K/uL   Eosinophils Relative 1  0 - 5 %   Eosinophils Absolute 0.1  0.0 - 0.7 K/uL   Basophils Relative 0  0 - 1 %   Basophils Absolute 0.0  0.0 - 0.1 K/uL  COMPREHENSIVE METABOLIC PANEL      Result Value Range   Sodium 139  135 - 145 mEq/L   Potassium 4.4  3.5 - 5.1 mEq/L   Chloride 103  96 - 112 mEq/L   CO2 29  19 - 32 mEq/L   Glucose, Bld 106 (*) 70 - 99 mg/dL   BUN 29 (*) 6 - 23 mg/dL   Creatinine, Ser 1.31 (*) 0.50 - 1.10 mg/dL   Calcium 10.7 (*) 8.4 - 10.5 mg/dL   Total Protein 7.0  6.0 - 8.3 g/dL   Albumin 3.7  3.5 - 5.2 g/dL   AST 29  0 - 37 U/L   ALT 48 (*) 0 - 35 U/L   Alkaline Phosphatase 120 (*) 39 - 117 U/L   Total Bilirubin 1.0  0.3 - 1.2 mg/dL   GFR calc non Af Amer 45 (*) >90 mL/min   GFR calc Af Amer 52 (*) >90 mL/min  URINALYSIS, ROUTINE W REFLEX MICROSCOPIC      Result Value Range   Color, Urine YELLOW  YELLOW   APPearance CLEAR  CLEAR   Specific Gravity, Urine 1.015  1.005 - 1.030   pH 6.5  5.0 - 8.0   Glucose, UA NEGATIVE  NEGATIVE mg/dL   Hgb urine dipstick NEGATIVE  NEGATIVE   Bilirubin Urine NEGATIVE  NEGATIVE   Ketones, ur NEGATIVE  NEGATIVE mg/dL   Protein, ur NEGATIVE  NEGATIVE mg/dL   Urobilinogen, UA 1.0  0.0 - 1.0 mg/dL   Nitrite NEGATIVE  NEGATIVE   Leukocytes, UA NEGATIVE  NEGATIVE   Dg Chest 2 View  04/04/2013  *RADIOLOGY REPORT*  Clinical Data: Left flank pain and cough  CHEST - 2 VIEW  Comparison: 08/14/2012 11/11/2006.  Findings: Hypoaeration.  Linear lung base opacities and right middle lobe.  Nodular component left lung base associated with the scarring is similar.  Heart size upper normal.  Mediastinal contours otherwise within normal range allowing for inspiratory effort.  No pleural effusion or pneumothorax.  No acute osseous finding.  IMPRESSION: Mild opacities at the bases and right middle lobe, favor atelectasis and/or scarring.   Original Report Authenticated By: Carlos Levering, M.D.      7:06 AM No evidence of pneumonia on chest x-ray. Will discharge the patient to home primary care followup.  Return precautions given.  Discussed x-ray results with Dr. Sharol Given, who agrees that the patient can be discharged home at this time.  No fever, cough, or white count.  Montine Circle, PA-C 04/04/13 [phone]

## 2013-04-04 NOTE — ED Notes (Signed)
Pt states that she is having left flank pain, pt states that she started having the pain Sunday and she took medication that did help, but this morning the pain woke her up out of her sleep. Pt has a kidney transplant on her right side. Pt denies blood in urine but is slightly nauseated.

## 2013-04-04 NOTE — ED Provider Notes (Signed)
History     CSN: [id number]  Arrival date & time 04/04/13  [id number]   First MD Initiated Contact with Patient 04/04/13 [phone]      Chief Complaint  Patient presents with  . Flank Pain    (Consider location/radiation/quality/duration/timing/severity/associated sxs/prior treatment) HPI History provided by pt.   Pt has had non-radiating pain in L flank for the past 3 days.  Intermittent at onset but constant for the past day.  Usually dull but becomes a severe spasm w/ certain movements.  Associated w/ cough.  Denies fever, SOB, N/V/D, urinary and vaginal sx.  Denies trauma and recent heavy lifting.  No h/o kidney stones.  Has had pain like this in the past, associated w/ PNA.  Past Medical History  Diagnosis Date  . Polycystic kidney   . Hyperlipidemia   . GERD (gastroesophageal reflux disease)   . Arthritis   . Depression   . PONV (postoperative nausea and vomiting)   . Asthma   . Hyperparathyroidism     Past Surgical History  Procedure Laterality Date  . Tonsillectomy    . Carpal tunnel release    . Cesarean section    . Tubal ligation    . Av fistula placement  10-21-2010    left Brachiocephalic AVF  . Bilateral oophorectomy    . Insertion of dialysis catheter  03/31/2012    Procedure: INSERTION OF DIALYSIS CATHETER;  Surgeon: Angelia Mould, MD;  Location: [facility];  Service: Vascular;  Laterality: N/A;  insertion of dialysis catheter right internal jugular vein  . Kidney transplant      06/02/2012    Family History  Problem Relation Age of Onset  . Hypertension Mother   . Heart disease Father     CABG history    History  Substance Use Topics  . Smoking status: Former Smoker    Quit date: 03/22/1990  . Smokeless tobacco: Never Used  . Alcohol Use: No    OB History   Grav Para Term Preterm Abortions TAB SAB Ect Mult Living                  Review of Systems  All other systems reviewed and are negative.    Allergies  Infed; Erythromycin; and  Ultram  Home Medications   Current Outpatient Rx  Name  Route  Sig  Dispense  Refill  . aspirin EC 81 MG tablet   Oral   Take 81 mg by mouth daily.         . citalopram (CELEXA) 20 MG tablet   Oral   Take 20 mg by mouth daily.         [name] losartan (COZAAR) 25 MG tablet   Oral   Take 25 mg by mouth daily.         . magnesium oxide (MAG-OX) 400 MG tablet   Oral   Take 400 mg by mouth 2 (two) times daily.         . mycophenolate (MYFORTIC) 180 MG EC tablet   Oral   Take 180 mg by mouth 2 (two) times daily.         . pantoprazole (PROTONIX) 40 MG tablet   Oral   Take 40 mg by mouth daily.         . predniSONE (DELTASONE) 5 MG tablet   Oral   Take 2.5-5 mg by mouth 2 (two) times daily. Take 1 tablet every morning and take 1/2 tablet every evening         .  PRESCRIPTION MEDICATION   Both Eyes   Place 1 drop into both eyes every evening. Eye Drops         . tacrolimus (PROGRAF) 1 MG capsule   Oral   Take 1 mg by mouth 2 (two) times daily.           BP 130/70  Pulse 88  Temp(Src) 98.4 F (36.9 C) (Oral)  Resp 16  SpO2 95%  Physical Exam  Nursing note and vitals reviewed. Constitutional: She is oriented to person, place, and time. She appears well-developed and well-nourished. No distress.  HENT:  Head: Normocephalic and atraumatic.  Eyes:  Normal appearance  Neck: Normal range of motion.  Cardiovascular: Normal rate and regular rhythm.   Pulmonary/Chest: Effort normal and breath sounds normal. No respiratory distress.  Pt denies pleuritic pain in L flank on exam  Abdominal: Soft. Bowel sounds are normal. She exhibits no distension. There is no tenderness.  obese  Genitourinary:  L CVA ttp  Musculoskeletal: Normal range of motion.  Neurological: She is alert and oriented to person, place, and time.  Skin: Skin is warm and dry. No rash noted.  Psychiatric: She has a normal mood and affect. Her behavior is normal.    ED Course  Procedures  (including critical care time)  Labs Reviewed  CBC WITH DIFFERENTIAL - Abnormal; Notable for the following:    Hemoglobin 15.7 (*)    HCT 46.9 (*)    RDW 15.9 (*)    Platelets 141 (*)    Neutrophils Relative 80 (*)    Lymphocytes Relative 11 (*)    All other components within normal limits  COMPREHENSIVE METABOLIC PANEL - Abnormal; Notable for the following:    Glucose, Bld 106 (*)    BUN 29 (*)    Creatinine, Ser 1.31 (*)    Calcium 10.7 (*)    ALT 48 (*)    Alkaline Phosphatase 120 (*)    GFR calc non Af Amer 45 (*)    GFR calc Af Amer 52 (*)    All other components within normal limits  URINALYSIS, ROUTINE W REFLEX MICROSCOPIC   No results found.   No diagnosis found.    MDM  56yo F w/ F w/ kidney transplant on immunosuppressants, presents w/ non-traumatic L flank pain.  History and exam most consistent w/ muscle strain/spasm and U/A negative for infection.  CXR ordered b/c patient has had this pain in association w/ PNA in the past and has has had recent cough.  [place], PA-C to follow results and reassess pt after she receive IV morphine and valium.  6:10 AM         Remer Macho, PA-C 04/04/13 [phone]

## 2013-05-15 ENCOUNTER — Encounter (HOSPITAL_COMMUNITY): Payer: Self-pay | Admitting: Emergency Medicine

## 2013-05-15 ENCOUNTER — Emergency Department (HOSPITAL_COMMUNITY): Payer: 59

## 2013-05-15 ENCOUNTER — Emergency Department (HOSPITAL_COMMUNITY)
Admission: EM | Admit: 2013-05-15 | Discharge: 2013-05-15 | Disposition: A | Discharge status: Home / Self Care | Payer: 59 | Attending: Emergency Medicine | Admitting: Emergency Medicine

## 2013-05-15 DIAGNOSIS — Z87891 Personal history of nicotine dependence: Secondary | ICD-10-CM | POA: Insufficient documentation

## 2013-05-15 DIAGNOSIS — R197 Diarrhea, unspecified: Secondary | ICD-10-CM | POA: Insufficient documentation

## 2013-05-15 DIAGNOSIS — R112 Nausea with vomiting, unspecified: Secondary | ICD-10-CM | POA: Insufficient documentation

## 2013-05-15 DIAGNOSIS — Z940 Kidney transplant status: Secondary | ICD-10-CM | POA: Insufficient documentation

## 2013-05-15 DIAGNOSIS — Z7982 Long term (current) use of aspirin: Secondary | ICD-10-CM | POA: Insufficient documentation

## 2013-05-15 DIAGNOSIS — Z79899 Other long term (current) drug therapy: Secondary | ICD-10-CM | POA: Insufficient documentation

## 2013-05-15 DIAGNOSIS — J45909 Unspecified asthma, uncomplicated: Secondary | ICD-10-CM | POA: Insufficient documentation

## 2013-05-15 DIAGNOSIS — Z8719 Personal history of other diseases of the digestive system: Secondary | ICD-10-CM | POA: Insufficient documentation

## 2013-05-15 DIAGNOSIS — F3289 Other specified depressive episodes: Secondary | ICD-10-CM | POA: Insufficient documentation

## 2013-05-15 DIAGNOSIS — IMO0002 Reserved for concepts with insufficient information to code with codable children: Secondary | ICD-10-CM | POA: Insufficient documentation

## 2013-05-15 DIAGNOSIS — K219 Gastro-esophageal reflux disease without esophagitis: Secondary | ICD-10-CM | POA: Insufficient documentation

## 2013-05-15 DIAGNOSIS — R109 Unspecified abdominal pain: Secondary | ICD-10-CM | POA: Insufficient documentation

## 2013-05-15 DIAGNOSIS — Z8639 Personal history of other endocrine, nutritional and metabolic disease: Secondary | ICD-10-CM | POA: Insufficient documentation

## 2013-05-15 DIAGNOSIS — M129 Arthropathy, unspecified: Secondary | ICD-10-CM | POA: Insufficient documentation

## 2013-05-15 DIAGNOSIS — Z87448 Personal history of other diseases of urinary system: Secondary | ICD-10-CM | POA: Insufficient documentation

## 2013-05-15 DIAGNOSIS — F329 Major depressive disorder, single episode, unspecified: Secondary | ICD-10-CM | POA: Insufficient documentation

## 2013-05-15 DIAGNOSIS — Z862 Personal history of diseases of the blood and blood-forming organs and certain disorders involving the immune mechanism: Secondary | ICD-10-CM | POA: Insufficient documentation

## 2013-05-15 LAB — URINALYSIS, ROUTINE W REFLEX MICROSCOPIC
Bilirubin Urine: NEGATIVE
Glucose, UA: NEGATIVE mg/dL
Ketones, ur: NEGATIVE mg/dL
Leukocytes, UA: NEGATIVE
Protein, ur: NEGATIVE mg/dL
pH: 6 (ref 5.0–8.0)

## 2013-05-15 LAB — CBC WITH DIFFERENTIAL/PLATELET
HCT: 44.6 % (ref 36.0–46.0)
Hemoglobin: 14.6 g/dL (ref 12.0–15.0)
Lymphocytes Relative: 9 % — ABNORMAL LOW (ref 12–46)
Monocytes Absolute: 0.7 10*3/uL (ref 0.1–1.0)
Monocytes Relative: 7 % (ref 3–12)
Neutro Abs: 7.4 10*3/uL (ref 1.7–7.7)
Neutrophils Relative %: 82 % — ABNORMAL HIGH (ref 43–77)
RBC: 4.51 MIL/uL (ref 3.87–5.11)
WBC: 9 10*3/uL (ref 4.0–10.5)

## 2013-05-15 LAB — COMPREHENSIVE METABOLIC PANEL
AST: 32 U/L (ref 0–37)
Albumin: 3.7 g/dL (ref 3.5–5.2)
Alkaline Phosphatase: 123 U/L — ABNORMAL HIGH (ref 39–117)
BUN: 26 mg/dL — ABNORMAL HIGH (ref 6–23)
CO2: 25 mEq/L (ref 19–32)
Chloride: 105 mEq/L (ref 96–112)
Creatinine, Ser: 1.22 mg/dL — ABNORMAL HIGH (ref 0.50–1.10)
GFR calc non Af Amer: 49 mL/min — ABNORMAL LOW (ref >90)
Potassium: 4.6 mEq/L (ref 3.5–5.1)
Total Bilirubin: 1 mg/dL (ref 0.3–1.2)

## 2013-05-15 LAB — LIPASE, BLOOD: Lipase: 36 U/L (ref 11–59)

## 2013-05-15 MED ORDER — ONDANSETRON HCL 4 MG/2ML IJ SOLN
INTRAMUSCULAR | Status: AC
  Administered 2013-05-15: 4 mg via INTRAVENOUS
  Filled 2013-05-15: qty 2

## 2013-05-15 MED ORDER — PROMETHAZINE HCL 25 MG/ML IJ SOLN
12.5000 mg | Freq: Once | INTRAMUSCULAR | Status: AC
  Administered 2013-05-15: 12.5 mg via INTRAVENOUS
  Filled 2013-05-15: qty 1

## 2013-05-15 MED ORDER — ONDANSETRON HCL 4 MG PO TABS: 4.0000 mg | ORAL_TABLET | Freq: Four times a day (QID) | ORAL | Status: DC

## 2013-05-15 MED ORDER — ONDANSETRON HCL 4 MG/2ML IJ SOLN
4.0000 mg | INTRAMUSCULAR | Status: AC
  Administered 2013-05-15: 4 mg via INTRAVENOUS
  Filled 2013-05-15: qty 2

## 2013-05-15 MED ORDER — SULFAMETHOXAZOLE-TRIMETHOPRIM 800-160 MG PO TABS: 1.0000 | ORAL_TABLET | Freq: Two times a day (BID) | ORAL | Status: AC

## 2013-05-15 MED ORDER — OXYCODONE-ACETAMINOPHEN 5-325 MG PO TABS: 1.0000 | ORAL_TABLET | Freq: Four times a day (QID) | ORAL | Status: DC | PRN

## 2013-05-15 MED ORDER — HYDROMORPHONE HCL PF 1 MG/ML IJ SOLN
1.0000 mg | Freq: Once | INTRAMUSCULAR | Status: AC
  Administered 2013-05-15: 1 mg via INTRAVENOUS
  Filled 2013-05-15: qty 1

## 2013-05-15 MED ORDER — SODIUM CHLORIDE 0.9 % IV BOLUS (SEPSIS)
1000.0000 mL | Freq: Once | INTRAVENOUS | Status: AC
  Administered 2013-05-15: 1000 mL via INTRAVENOUS

## 2013-05-15 MED ORDER — HYDROMORPHONE HCL PF 1 MG/ML IJ SOLN
0.5000 mg | Freq: Once | INTRAMUSCULAR | Status: AC
  Administered 2013-05-15: 0.5 mg via INTRAVENOUS
  Filled 2013-05-15: qty 1

## 2013-05-15 NOTE — ED Provider Notes (Signed)
History     CSN: [id number]  Arrival date & time 05/15/13  [id number]   None     Chief Complaint  Patient presents with  . Nausea  . Emesis    (Consider location/radiation/quality/duration/timing/severity/associated sxs/prior treatment) HPI Comments: 57 y/o female with hx of kidney transplant in July 2013, currently on immunosuppressants, who presents for epigastric and right-sided abdominal pain with onset 4 hours ago. Patient states the pain is sharp; intermittent at onset, but constant for the last hour or 2. Patient states the pain originates in her epigastric region and radiates to her right upper quadrant. Patient denies any aggravating or alleviating factors of this pain. Patient has had associated nausea and NB/NB emesis. She states she has had too many episodes of emesis to count. She also admits to looser stool which has been brown in color. Patient denies fevers, CP, SOB, melena, hematochezia, and urinary symptoms. Patient further denies hx of cholecystectomy or appendectomy; only other hx of abdominal surgery includes b/l oophorectomy, tubal ligation, and C-section.  The history is provided by the patient. No language interpreter was used.    Past Medical History  Diagnosis Date  . Polycystic kidney   . Hyperlipidemia   . GERD (gastroesophageal reflux disease)   . Arthritis   . Depression   . PONV (postoperative nausea and vomiting)   . Asthma   . Hyperparathyroidism     Past Surgical History  Procedure Laterality Date  . Tonsillectomy    . Carpal tunnel release    . Cesarean section    . Tubal ligation    . Av fistula placement  10-21-2010    left Brachiocephalic AVF  . Bilateral oophorectomy    . Insertion of dialysis catheter  03/31/2012    Procedure: INSERTION OF DIALYSIS CATHETER;  Surgeon: Angelia Mould, MD;  Location: [facility];  Service: Vascular;  Laterality: N/A;  insertion of dialysis catheter right internal jugular vein  . Kidney transplant       06/02/2012    Family History  Problem Relation Age of Onset  . Hypertension Mother   . Heart disease Father     CABG history    History  Substance Use Topics  . Smoking status: Former Smoker    Quit date: 03/22/1990  . Smokeless tobacco: Never Used  . Alcohol Use: No    OB History   Grav Para Term Preterm Abortions TAB SAB Ect Mult Living                  Review of Systems  Constitutional: Negative for fever.  Respiratory: Negative for shortness of breath.   Cardiovascular: Negative for chest pain.  Gastrointestinal: Positive for nausea, vomiting and abdominal pain. Negative for diarrhea and blood in stool.  Genitourinary: Negative for dysuria and hematuria.  All other systems reviewed and are negative.    Allergies  Infed; Erythromycin; and Ultram  Home Medications   Current Outpatient Rx  Name  Route  Sig  Dispense  Refill  . aspirin EC 81 MG tablet   Oral   Take 81 mg by mouth daily.         . citalopram (CELEXA) 20 MG tablet   Oral   Take 20 mg by mouth daily.         [name] losartan (COZAAR) 25 MG tablet   Oral   Take 25 mg by mouth daily.         . magnesium oxide (MAG-OX) 400 MG tablet  Oral   Take 400 mg by mouth 2 (two) times daily.         . mycophenolate (MYFORTIC) 180 MG EC tablet   Oral   Take 180 mg by mouth 2 (two) times daily.         . pantoprazole (PROTONIX) 40 MG tablet   Oral   Take 40 mg by mouth daily.         . predniSONE (DELTASONE) 5 MG tablet   Oral   Take 2.5-5 mg by mouth 2 (two) times daily. Take 1 tablet every morning and take 1/2 tablet every evening         . PRESCRIPTION MEDICATION   Both Eyes   Place 1 drop into both eyes every evening. Eye Drops         . tacrolimus (PROGRAF) 1 MG capsule   Oral   Take 1 mg by mouth 2 (two) times daily.         . ondansetron (ZOFRAN) 4 MG tablet   Oral   Take 1 tablet (4 mg total) by mouth every 6 (six) hours.   12 tablet   0   . oxyCODONE-acetaminophen  (PERCOCET/ROXICET) 5-325 MG per tablet   Oral   Take 1-2 tablets by mouth every 6 (six) hours as needed for pain.   12 tablet   0   . sulfamethoxazole-trimethoprim (BACTRIM DS,SEPTRA DS) 800-160 MG per tablet   Oral   Take 1 tablet by mouth 2 (two) times daily. For 10 days   20 tablet   0     BP 124/60  Pulse 78  Temp(Src) 97.7 F (36.5 C) (Oral)  Resp 18  SpO2 100%  Physical Exam  Nursing note and vitals reviewed. Constitutional: She is oriented to person, place, and time. She appears well-developed and well-nourished. No distress.  HENT:  Head: Normocephalic and atraumatic.  Mouth/Throat: Oropharynx is clear and moist. No oropharyngeal exudate.  Eyes: Conjunctivae and EOM are normal. No scleral icterus.  Neck: Normal range of motion. Neck supple.  Cardiovascular: Normal rate, regular rhythm, normal heart sounds and intact distal pulses.   Pulmonary/Chest: Effort normal and breath sounds normal. No respiratory distress. She has no wheezes. She has no rales.  Abdominal: Soft. She exhibits no distension and no mass. There is tenderness (epigastric and RUQ). There is no rebound and no guarding.  RUQ focal tenderness with mild murphy's sign; no peritoneal signs or palpable abdominal masses appreciated. +mild CVA TTP. No TTP at McBurney's point.  Musculoskeletal: Normal range of motion.  Lymphadenopathy:    She has no cervical adenopathy.  Neurological: She is alert and oriented to person, place, and time.  Skin: Skin is warm and dry. No rash noted. She is not diaphoretic. No erythema. No pallor.  Psychiatric: She has a normal mood and affect. Her behavior is normal.    ED Course  Procedures (including critical care time)  Labs Reviewed  CBC WITH DIFFERENTIAL - Abnormal; Notable for the following:    Platelets 128 (*)    Neutrophils Relative % 82 (*)    Lymphocytes Relative 9 (*)    All other components within normal limits  COMPREHENSIVE METABOLIC PANEL - Abnormal;  Notable for the following:    Glucose, Bld 157 (*)    BUN 26 (*)    Creatinine, Ser 1.22 (*)    ALT 43 (*)    Alkaline Phosphatase 123 (*)    GFR calc non Af Amer 49 (*)  GFR calc Af Amer 57 (*)    All other components within normal limits  URINALYSIS, ROUTINE W REFLEX MICROSCOPIC - Abnormal; Notable for the following:    APPearance HAZY (*)    All other components within normal limits  LIPASE, BLOOD   Ct Abdomen Pelvis Wo Contrast  05/15/2013   *RADIOLOGY REPORT*  Clinical Data:  Right-sided flank and abdominal pain.  History kidney transplant.  CT ABDOMEN AND PELVIS WITHOUT CONTRAST (CT UROGRAM)  Technique: Contiguous axial images of the abdomen and pelvis without oral or intravenous contrast were obtained.  Comparison: Ultrasound earlier today.  Most recent CT of 04/13/2011.  Findings:  Exam is limited for evaluation of entities other than urinary tract calculi due to lack of oral or intravenous contrast.   Lung bases:  Subsegmental atelectasis at the lung bases. Cardiomegaly, without pericardial or pleural effusion.  Abdomen/pelvis:  Multiple hepatic cysts, similar in size and configuration on the prior exam.  Normal spleen, stomach, pancreas, gallbladder, biliary tract, adrenal glands.  Enlarged kidneys with multiple renal masses bilaterally.  Many of these have complexity. No hydronephrosis.  Bilateral renal parenchymal calcifications, likely associated with cysts.  No renal collecting system calculi. No retroperitoneal or retrocrural adenopathy.  Mild motion degradation in the mid abdomen.  Normal colon, appendix, and terminal ileum.  Normal small bowel without abdominal ascites.  No pelvic adenopathy. Since the prior CT, interval renal transplant in the right iliac fossa.  Minimal nonspecific peritransplant edema is identified, including on images 68 - 73/series 2.  This is of indeterminate acuity.  No transplant hydronephrosis.  Normal urinary bladder and uterus, without adnexal mass or  significant free pelvic fluid.  Eccentric right paramidline fat containing ventral hernia. Minimally increased in size.  Bones/Musculoskeletal:  Mild osteopenia.  Bilateral nonacute lower rib fractures.  The left-sided fractures appear new since 04/13/2011 (six lateral and tenth posterior).  IMPRESSION:  1.  Interval right iliac fossa renal transplant.  Mild nonspecific peritransplant edema identified.  This is of indeterminate acuity. Considerations include transplant infection or even rejection, in the right clinical setting.  2.  No other explanation for right-sided pain. 3.  Findings of polycystic kidney/liver disease. 4.  Normal appendix. 5.  Fat containing umbilical hernia, slightly increased. 6.  Nonacute rib fractures, including interval left-sided fractures since 04/13/2011.   Original Report Authenticated By: Abigail Miyamoto, M.D.   US Abdomen Complete  05/15/2013   *RADIOLOGY REPORT*  Clinical Data:  Right upper quadrant abdominal pain.  COMPLETE ABDOMINAL ULTRASOUND  Comparison:  CT 04/13/2011  Findings:  Gallbladder:  Normal, without wall thickening, stone, or pericholecystic fluid.  Sonographic Murphy's sign was not elicited.  Common bile duct: Normal, 4 mm  Liver: Multiple hepatic cysts.  Right liver lobe 3.9 cm lesion on image 24.  A left liver lobe 3.9 cm lesion may have mild complexity on image 12.  IVC: Negative  Pancreas:  Poorly visualized due to overlying bowel gas.  Spleen:  Normal in size and echogenicity.  Right Kidney:  20.1 cm.  Multiple renal cysts.  The largest measures 8.7 cm.  Left Kidney:  19.3 cm. No hydronephrosis.  Multiple renal cysts. The largest measures 9.2 cm.  Abdominal aorta:  Partially obscured distally by bowel gas.  No aneurysm or ascites.  Incidental note is made of right lower quadrant renal transplant. This measures 11.6 cm.  No evidence of transplant hydronephrosis or peritransplant fluid collection.  IMPRESSION:  1.  Findings of polycystic liver/kidney disease. 2.  No  other  explanation for abdominal pain. 3.  Normal appearance of right lower quadrant renal transplant. 4. Portions of anatomy obscured by overlying bowel gas.   Original Report Authenticated By: Abigail Miyamoto, M.D.     1. Abdominal pain   2. Nausea and vomiting      MDM  Patient presents for epigastric and right upper quadrant abdominal pain with nausea and NB/NB emesis. Physical exam significant for focal TTP in epigastrium and RUQ with mild CVA TTP on right. Will further evaluate with abdominal ultrasound as well as CBC, CMP, lipase, and UA. IVF, IV zofran and IV dilaudid ordered for symptoms.  Patient states that abdominal pain, nausea, and emesis have resolved with IV Zofran and Dilaudid. Ultrasound findings consistent with polycystic liver and kidney disease the largest of the right renal cysts measuring 8.7 cm in the largest of the left renal cyst measuring 9.2 cm. No evidence of cholelithiasis or cholecystitis. Will fluid challenge.  Patient tolerating PO fluids, however pain has returned. CT abd/pelvis ordered for further evaluation of abdominal pain etiology.  CT scan with mild nonspecific edema and stranding around the right kidney. No other acute changes to explain patient's abdominal pain and symptoms. Dr. Sabra Heck consulted with Dr. Jimmy Footman who believes the patient may have had an infected cyst or ruptured cyst of R kidney. He has recommended patient be placed on Bactrim twice a day x10 days. Patient has continued to tolerate PO fluids; abdominal pain and nausea well controlled. Patient to d/c with PCP and nephrology follow up. Indications for ED return provided. Patient states comfort and understanding with plan with no unaddressed concerns.     Antonietta Breach, PA-C 05/15/13 1439

## 2013-05-15 NOTE — ED Notes (Addendum)
Per Pt: Patient states she woke up at 0400 with sudden onset n/v. Pt reports having several bowel movements since 0400 with the last few being loose stools. Pt continues to feel nauseated at this time. Pt remains ax4, NAD at this time. Pt states she is kidney transplant recipient.

## 2013-05-15 NOTE — ED Notes (Signed)
Awaiting phenergan to come from pharmacy

## 2013-05-15 NOTE — ED Provider Notes (Signed)
Medical screening examination/treatment/procedure(s) were conducted as a shared visit with non-physician practitioner(s) and myself.  I personally evaluated the patient during the encounter  Please see my separate respective documentation pertaining to this patient encounter   Johnna Acosta, MD 05/15/13 2100

## 2013-05-15 NOTE — ED Provider Notes (Addendum)
57 year old female, history of kidney transplant last year who presents with a complaint of right-sided abdominal and flank pain. Onset was 4:00 AM, is now persistent, sharp, associated with nausea, vomiting and frequent stools. She is on chronic prednisone therapy, immunosuppressive therapy with Prograf. she denies fevers, diarrhea, rectal bleeding or changes in urinary habits. On exam the patient has a soft abdomen which is obese, non-tender on the left but has tenderness in the right upper quadrant and epigastrium. There is mild guarding but no Percell Yeiren Whitecotton sign is present. There are no peritoneal signs. Her heart and lungs are clear, no murmurs rubs or gallops, no peripheral edema, there is mild CVA tenderness on the right. The patient has no jaundice or scleral icterus. Mucous membranes are moist.  At this time the patient will need evaluation for urinary pathology, cholecystitis, colitis or possible hepatitis.  I have reviewed the labs, and imaging and have discussed the findings with Dr. Jimmy Footman who agrees that there is no need to further pursue Ms Stuver sx with further testing or admission to the hospital but to be started on abx and d/c home.  Medical screening examination/treatment/procedure(s) were conducted as a shared visit with non-physician practitioner(s) and myself.  I personally evaluated the patient during the encounter      Johnna Acosta, MD 05/15/13 [id number]  Johnna Acosta, MD 05/15/13 2058

## 2013-05-15 NOTE — ED Notes (Signed)
Patient transported to Ultrasound 

## 2013-05-15 NOTE — ED Notes (Signed)
Remains in u/s.

## 2013-08-28 IMAGING — XA IR SHUNTOGRAM/ FISTULAGRAM
1 series · 12 of 18 positions shown · non-contrast
Comparison: None

CLINICAL DATA: End-stage renal disease.  The patient is dialyzed
through a left arm AV fistula.  There has been recent significant
return of clots.

DIALYSIS AV FISTULOGRAM

[Series 1: run · 12 of 18 slices shown]
[im 1/18]
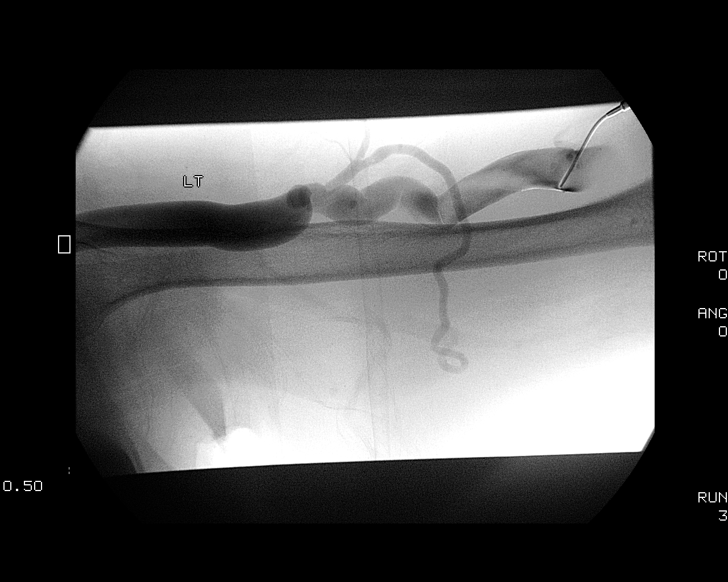
[im 3/18]
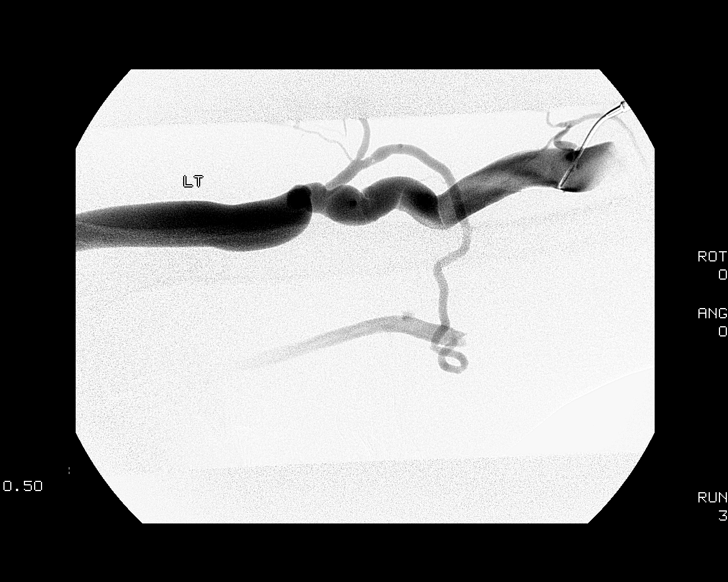
[im 4/18]
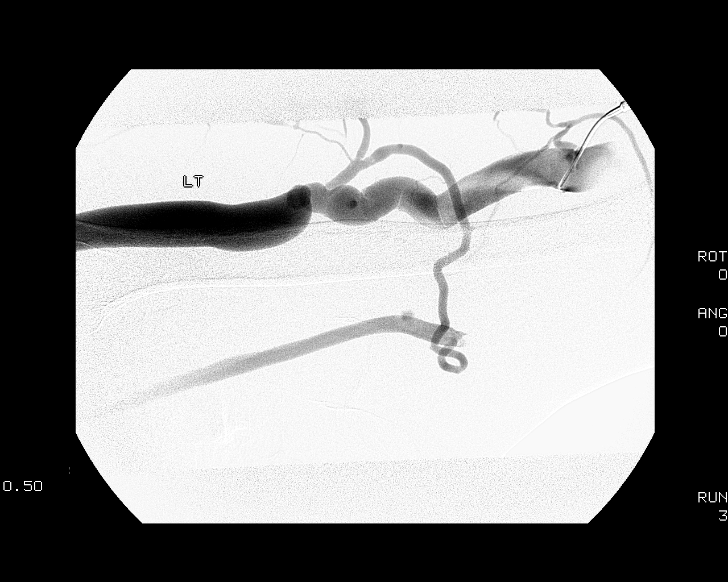
[im 6/18]
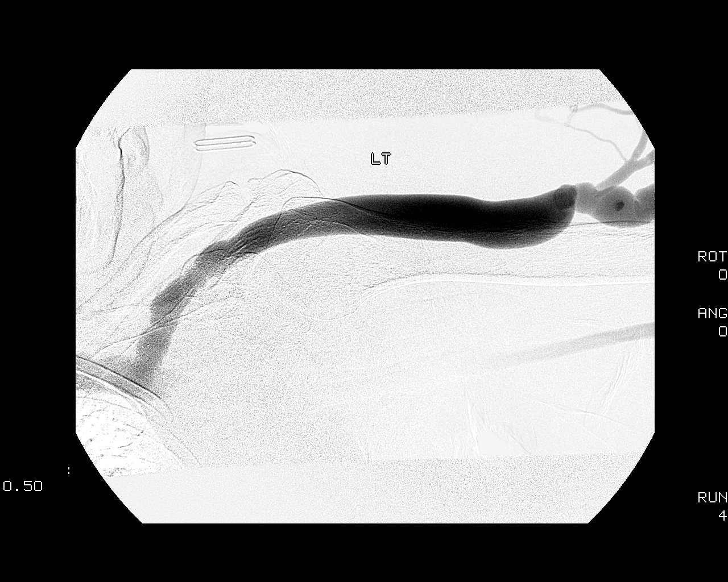
[im 7/18]
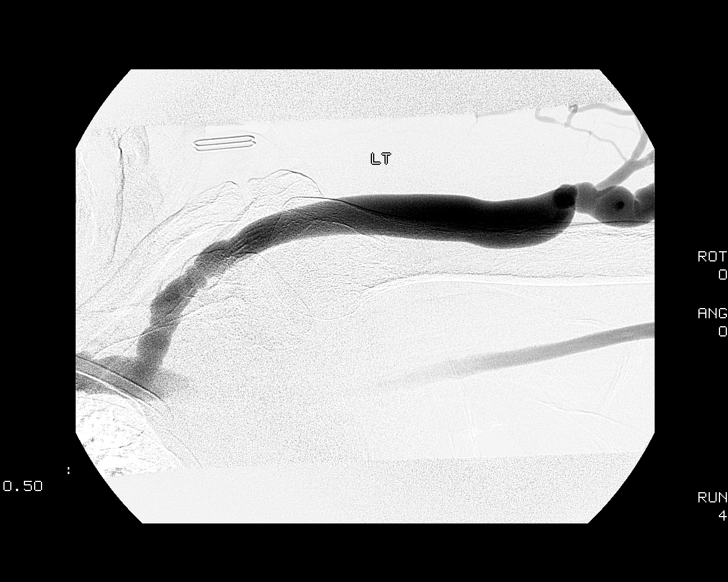
[im 9/18]
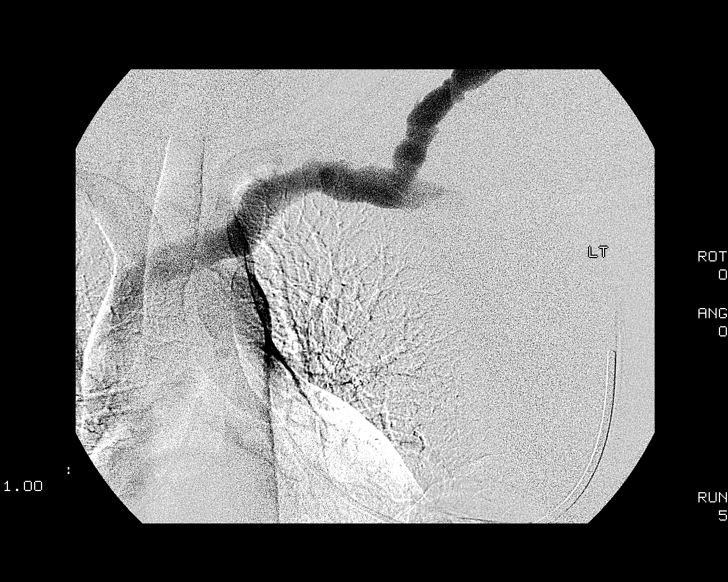
[im 10/18]
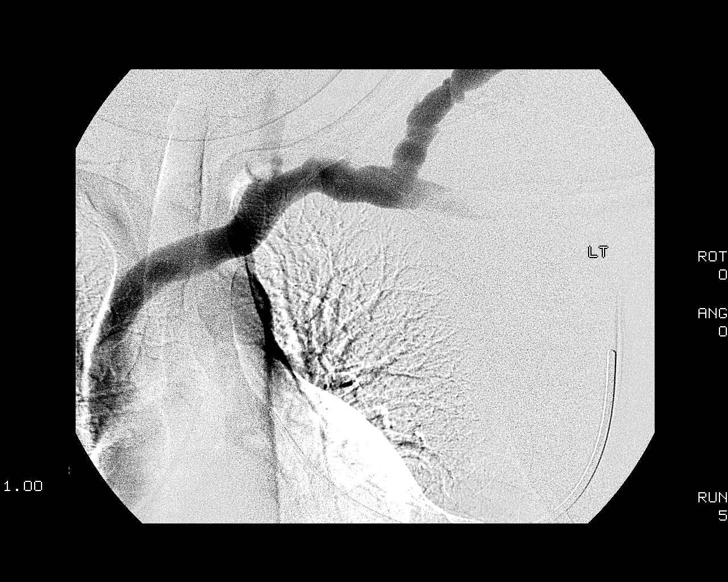
[im 12/18]
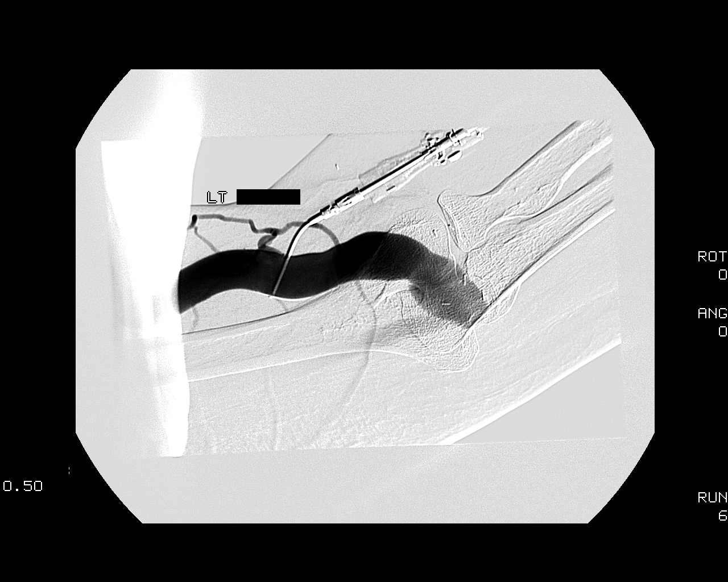
[im 13/18]
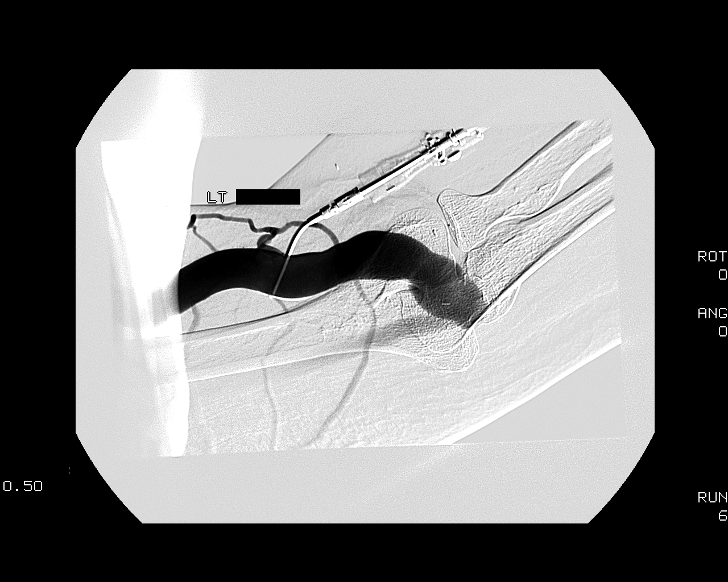
[im 15/18]
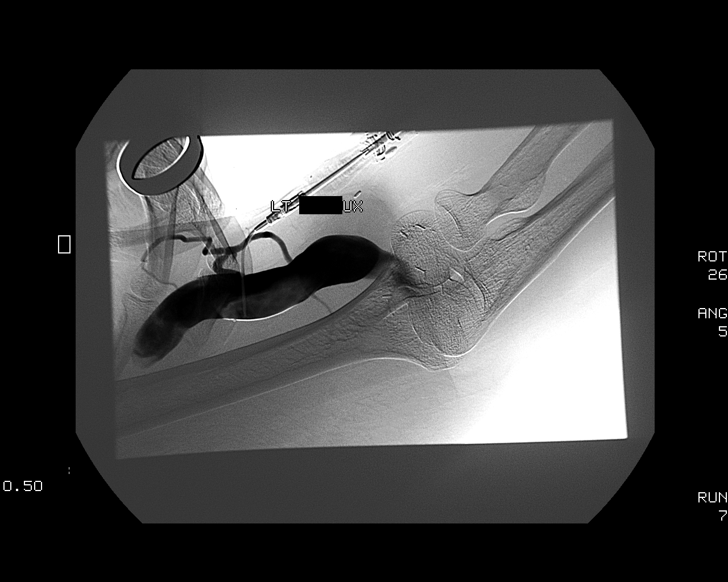
[im 16/18]
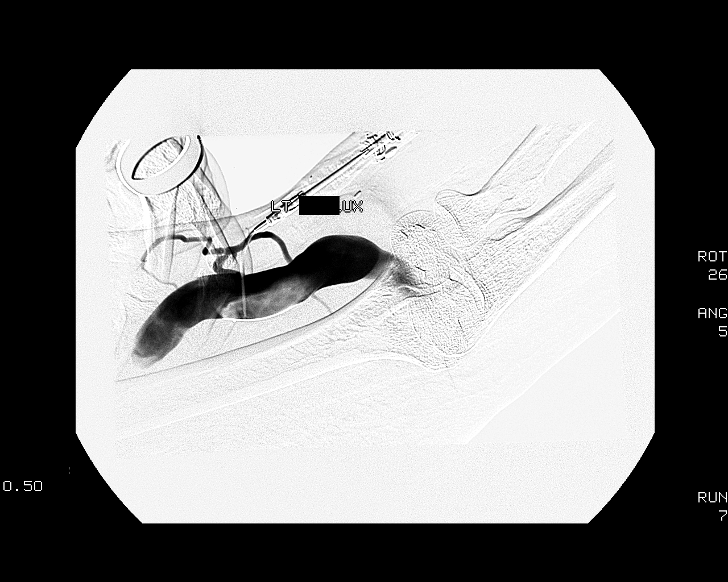
[im 18/18]
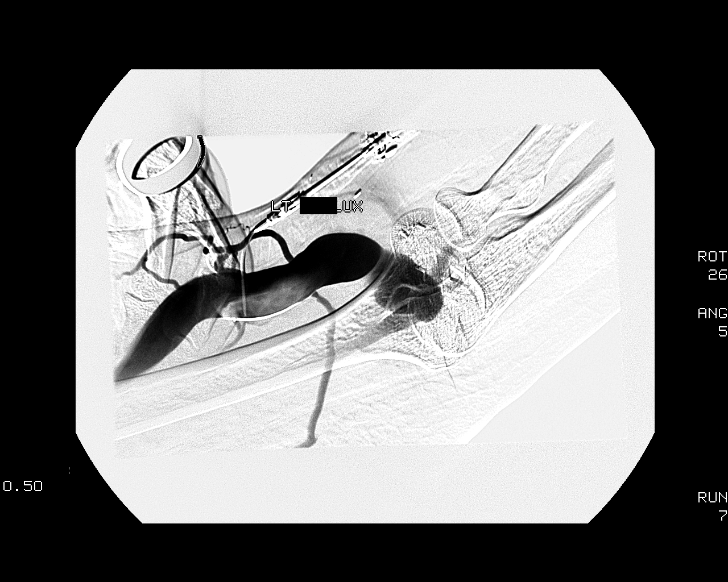

[12 of 18 positions shown; findings below may reference images not displayed]

Contrast:  40 ml Hmnipaque-5AA

Fluoroscopy Time: 1.0 minutes.

Procedure:  The procedure, risks, benefits, and alternatives were
explained to the patient.  Questions regarding the procedure were
encouraged and answered.  The patient understands and consents to
the procedure.

The left arm native fistula was prepped with Betadine in a sterile
fashion, and a sterile drape was applied covering the operative
field.  A diagnostic fistulogram was performed via an 18 gauge
angiocatheter introduced into venous outflow.  Venous drainage was
assessed to the level of the central veins in the chest.  Proximal
fistula was studied by reflux maneuver with temporary compression
of venous outflow.  After the procedure, the angiocatheter was
removed and hemostasis obtained with manual compression.

Complications: None
FINDINGS: Initial inspection of the fistula contrast study did not
reveal any significant stenoses with arterial anastomosis show
normal patency and central venous outflow being widely patent.
However, in review of the images obtained, there is a segment of
the cephalic vein in the mid upper arm that shows tortuosity and
filling of a branch vein that empties into the deep venous system.
At this level, due to tortuous overlap of the vein, an underlying
stenosis may be present.  For this reason, the patient will be
brought back for additional oblique views to determine if there is
a significant underlying stenosis.

The cephalic vein in general is large and well matured.  No other
outflow stenoses or obstructions are identified.
IMPRESSION: Possible focal area of stenosis at the level of venous tortuosity
of the cephalic vein in the mid upper arm with associated filling
of a branch vein which may be acting as a collateral.  This was
only noticed after the fact and the patient will be brought back
for additional oblique views and possible angioplasty if a
significant narrowing of the cephalic vein is uncovered.

Access Management:  See above discussion.

## 2013-09-01 IMAGING — XA IR PTA VENOUS
2 series · 12 of 24 positions shown · non-contrast
Comparison: Recent diagnostic fistulogram dated 02/22/2012.

CLINICAL DATA: Problems during hemodialysis via a left arm
fistula.  There has been return of clots and previous diagnostic
fistulogram demonstrated an area of potential significant narrowing
in the cephalic vein of the mid left upper arm.  The patient
returns for additional views and possible angioplasty.

VENOUS ANGIOPLASTY OF LEFT CEPHALIC VEIN

[Series 1: run · 11 of 113 slices shown (1 of 2)]
[im 6/113]
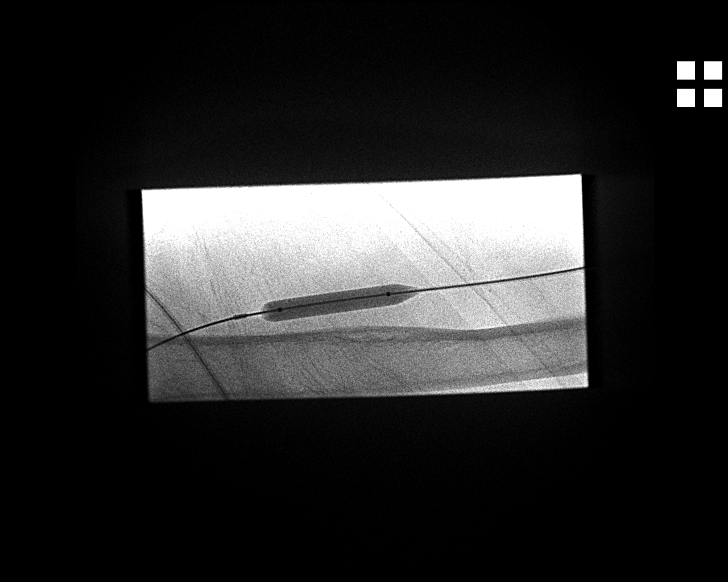
[im 17/113]
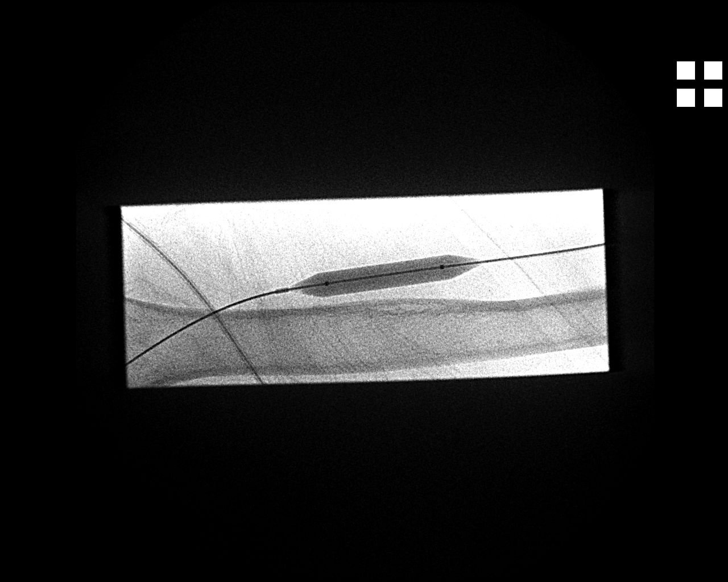
[im 27/113]
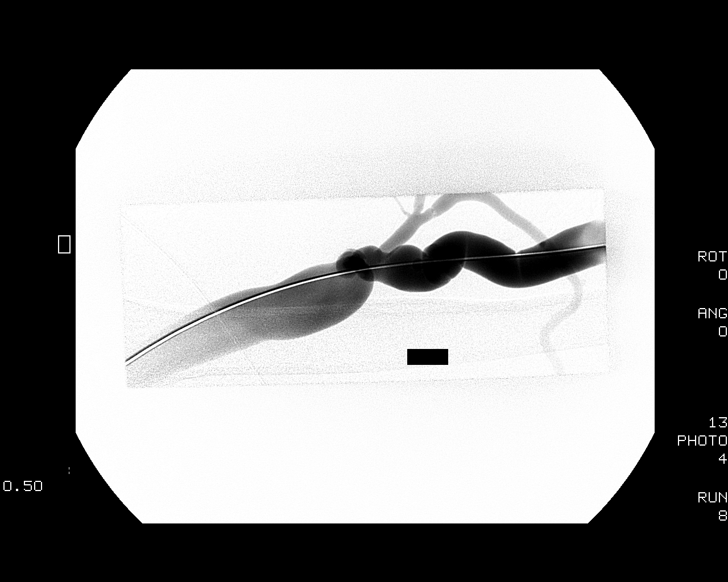
[im 38/113]
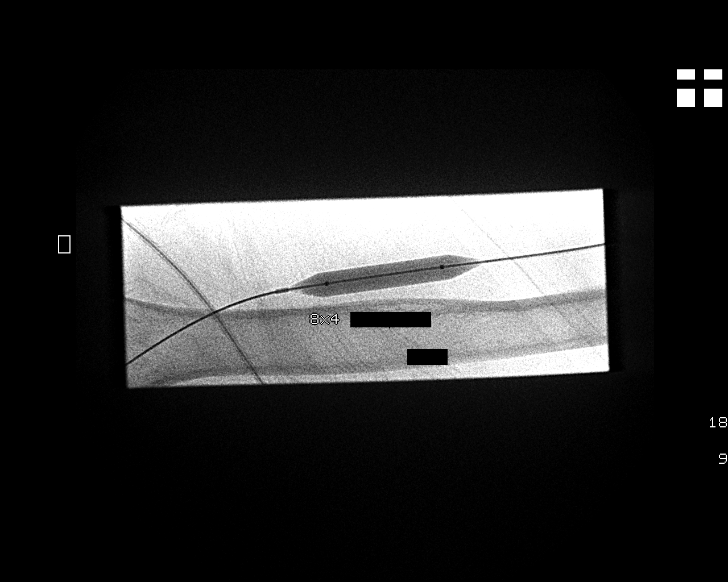
[im 49/113]
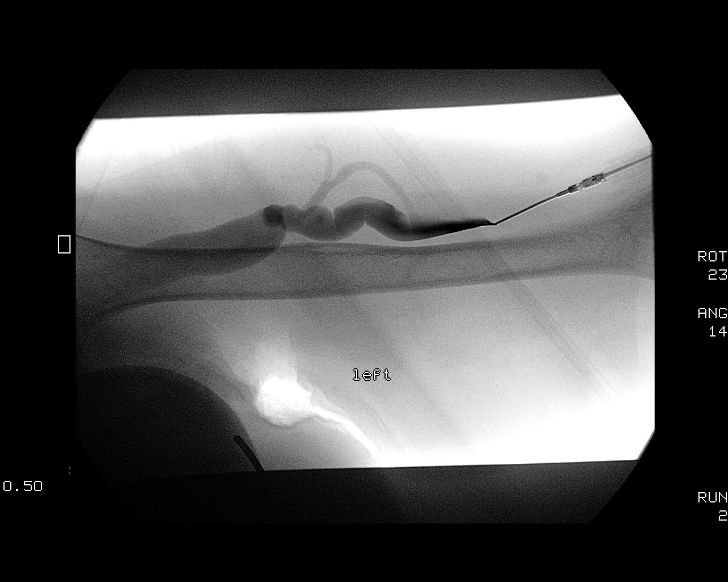
[im 59/113]
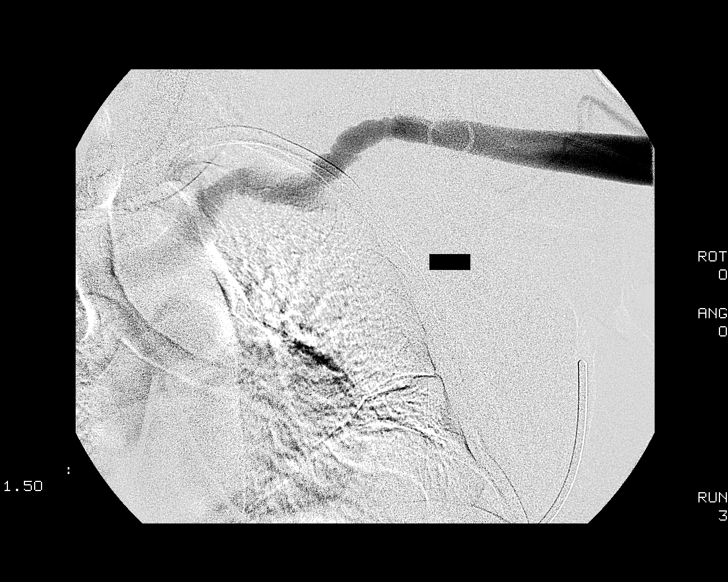
[im 70/113]
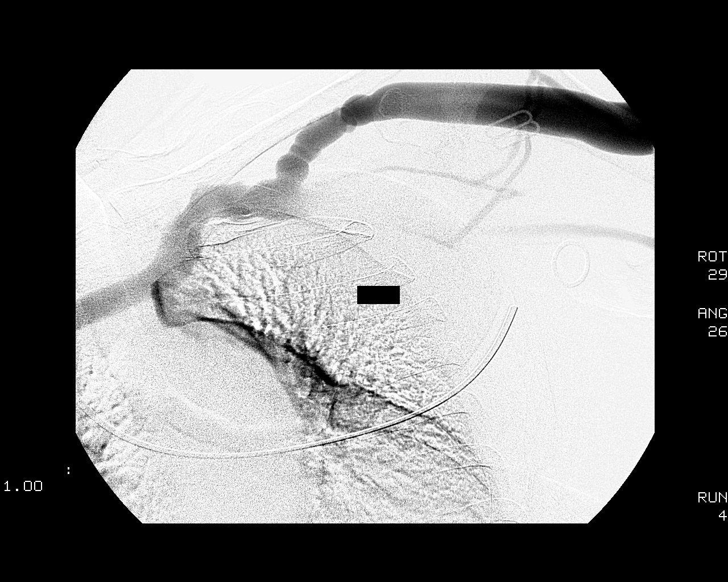
[im 81/113]
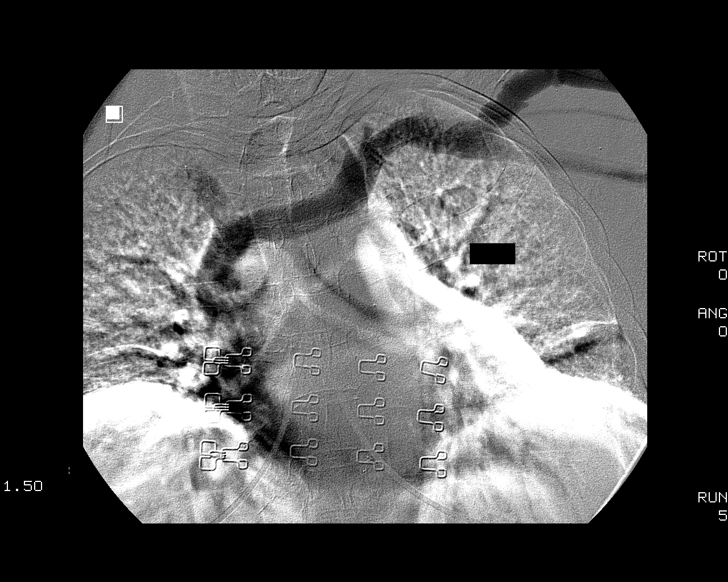
[im 91/113]
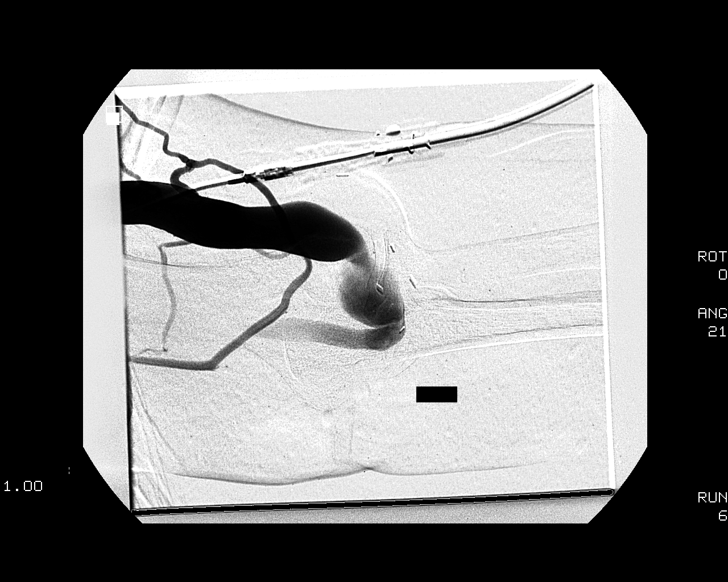
[im 102/113]
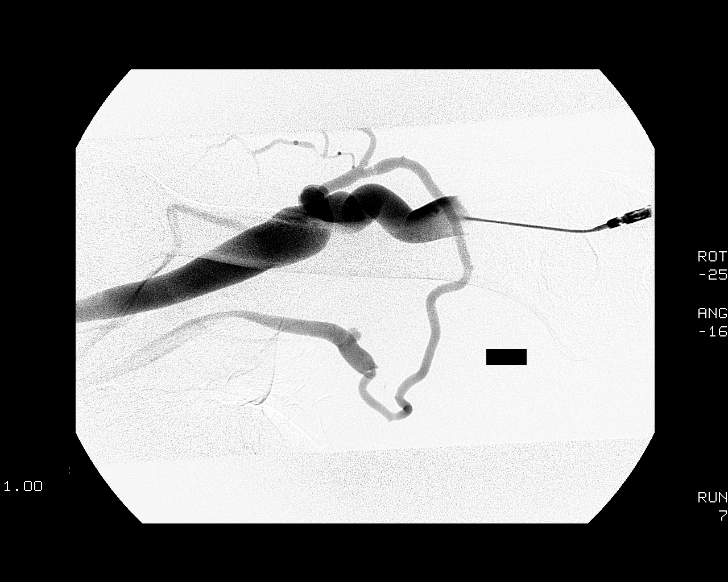
[im 113/113]
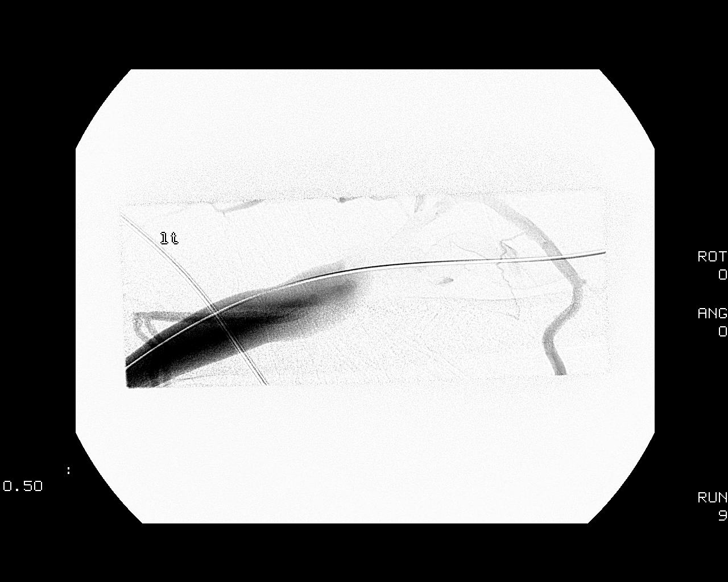

[Series 9: run · 1 of 8 slices shown (2 of 2)]
[im 8/8]
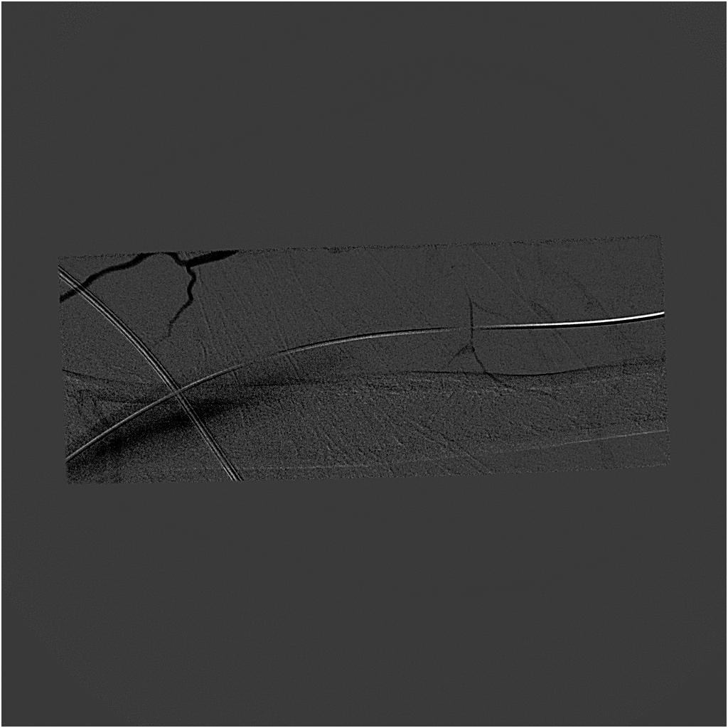

[12 of 24 positions shown; findings below may reference images not displayed]

Sedation:  1 mg IV Versed, 50 mcg IV Fentanyl

Total Moderate Sedation Time:  18 minutes.

Contrast:  80 ml 8mnipaque-HUU

Fluoroscopy Time: 2.3 minutes.

Procedure:  The procedure, risks, benefits, and alternatives were
explained to the patient.  Questions regarding the procedure were
encouraged and answered.  The patient understands and consents to
the procedure.

The left arm dialysis fistula was prepped with Betadine in a
sterile fashion, and a sterile drape was applied covering the
operative field. Cephalic vein patency was assessed into four
projections.

The angiocath was removed and replaced with a 6-French sheath over
a guidewire.  Balloon angioplasty of the cephalic vein was then
performed with a 7 mm x 4 cm Conquest balloon.  Balloon angioplasty
was followed by additional angiography. Further angioplasty was
performed with an 8 mm x 4 cm Conquest balloon.  Additional
angiography was performed.

The sheath was removed and hemostasis obtained with manual
compression.

Complications: None
FINDINGS: Initial imaging again demonstrates a focal area of
tortuosity of the cephalic vein in the mid upper arm with
associated filling of a branch vein that empties into the deep
venous system.  In different projections, there does appear to be a
moderate stenosis at the level of tortuosity of approximately 60 -
70%.  There was appearance of mild improvement after 7 mm balloon
angioplasty and moderate improvement after 8 mm balloon angioplasty
with roughly 30% stenosis remaining after treatment.  Flow did
appear improved through the segment and larger balloons were
therefore not utilized.
IMPRESSION: Moderate focal stenosis of the cephalic vein in the left mid upper
arm was confirmed.  This was treated with 7 mm and 8 mm balloon
angioplasty with improvement after treatment.

Access Management: The fistula remains amenable to percutaneous
intervention.

## 2013-10-05 IMAGING — CR DG CHEST 1V PORT
1 series · 1 of 1 positions shown · non-contrast
Comparison: Portable exam 6611 hours compared to 6461 hrs

CLINICAL DATA: Post right IJ Diatek catheter insertion

PORTABLE CHEST - 1 VIEW

[view not recorded]
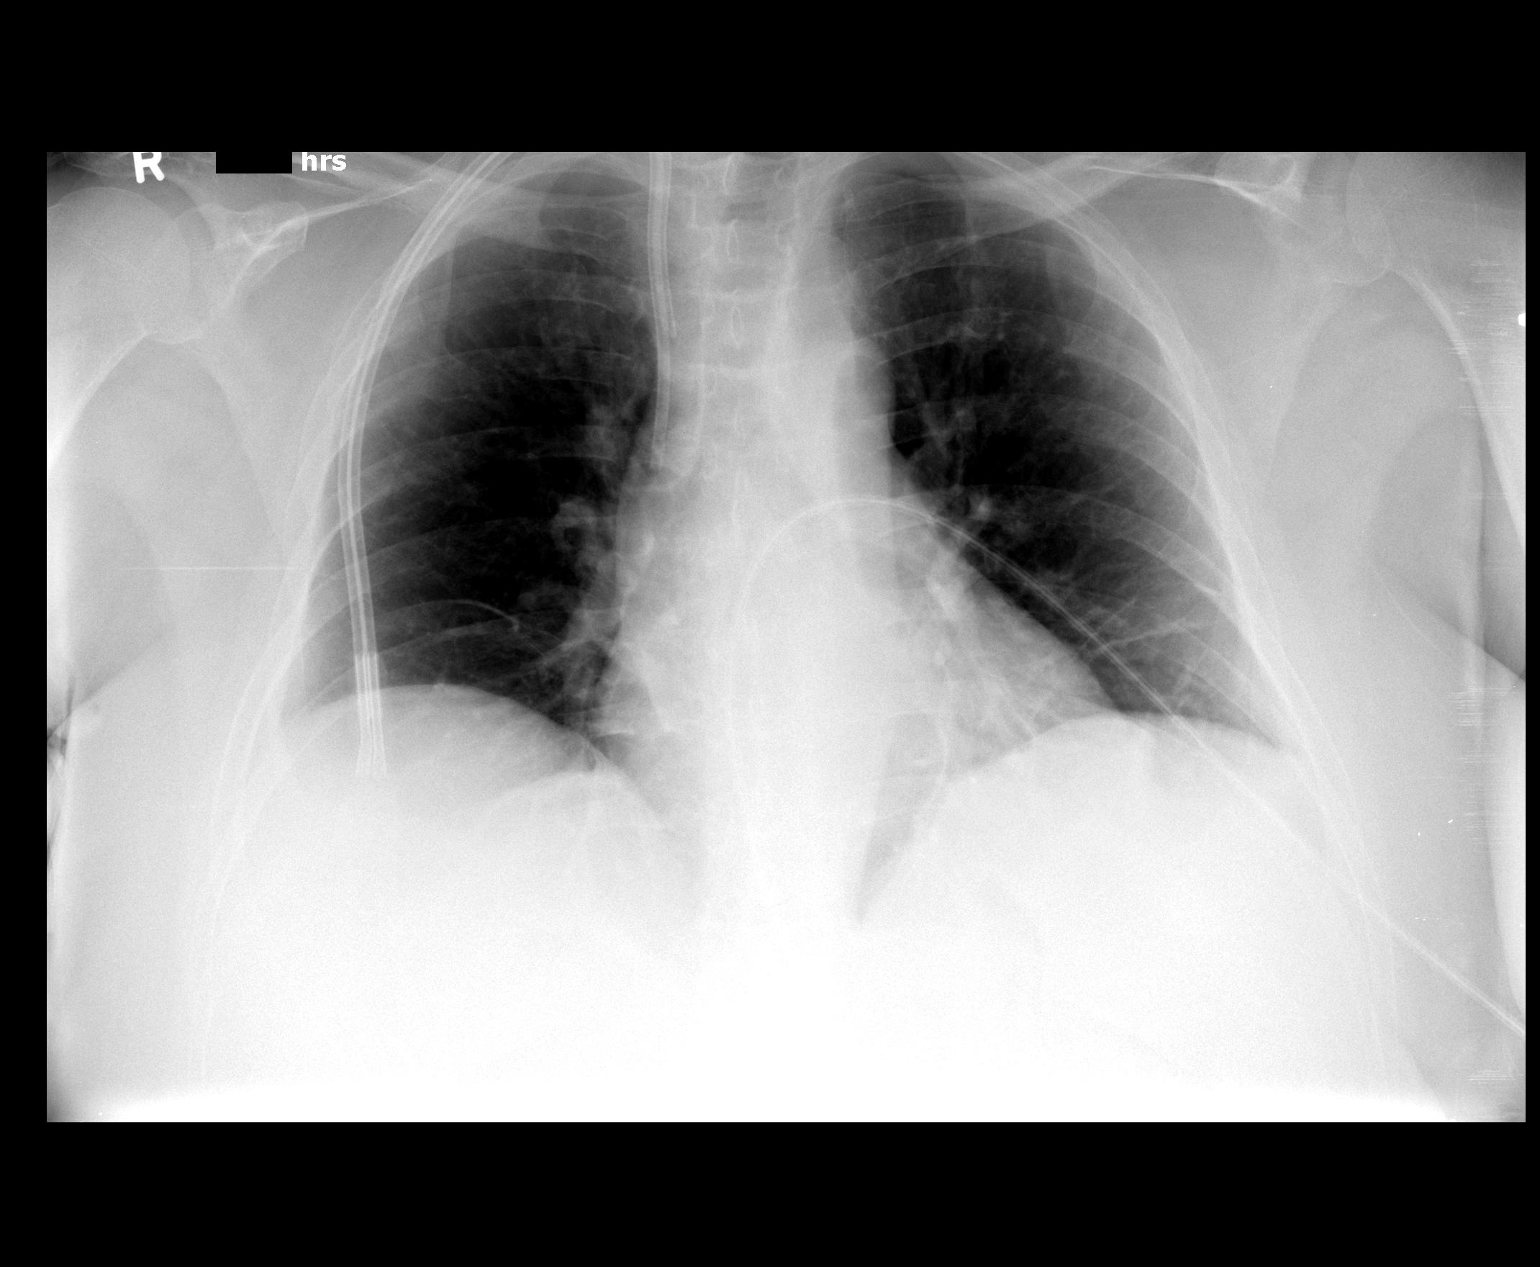

[1 of 1 positions shown; findings below may reference images not displayed]

FINDINGS: Right jugular dual-lumen central venous catheter, tip projecting
over SVC.
No pneumothorax.
Upper normal heart size.
Normal mediastinal contours and pulmonary vascularity.
Mild bibasilar atelectasis.
Lungs otherwise clear.
Bones unremarkable.
IMPRESSION: No pneumothorax following right jugular line insertion.
Mild bibasilar atelectasis.

## 2013-10-05 IMAGING — CR DG CHEST 2V
2 series · 2 of 2 positions shown · non-contrast
Comparison: None

CLINICAL DATA: Preop.  Hypertension.

CHEST - 2 VIEW

[view not recorded (1 of 2)]
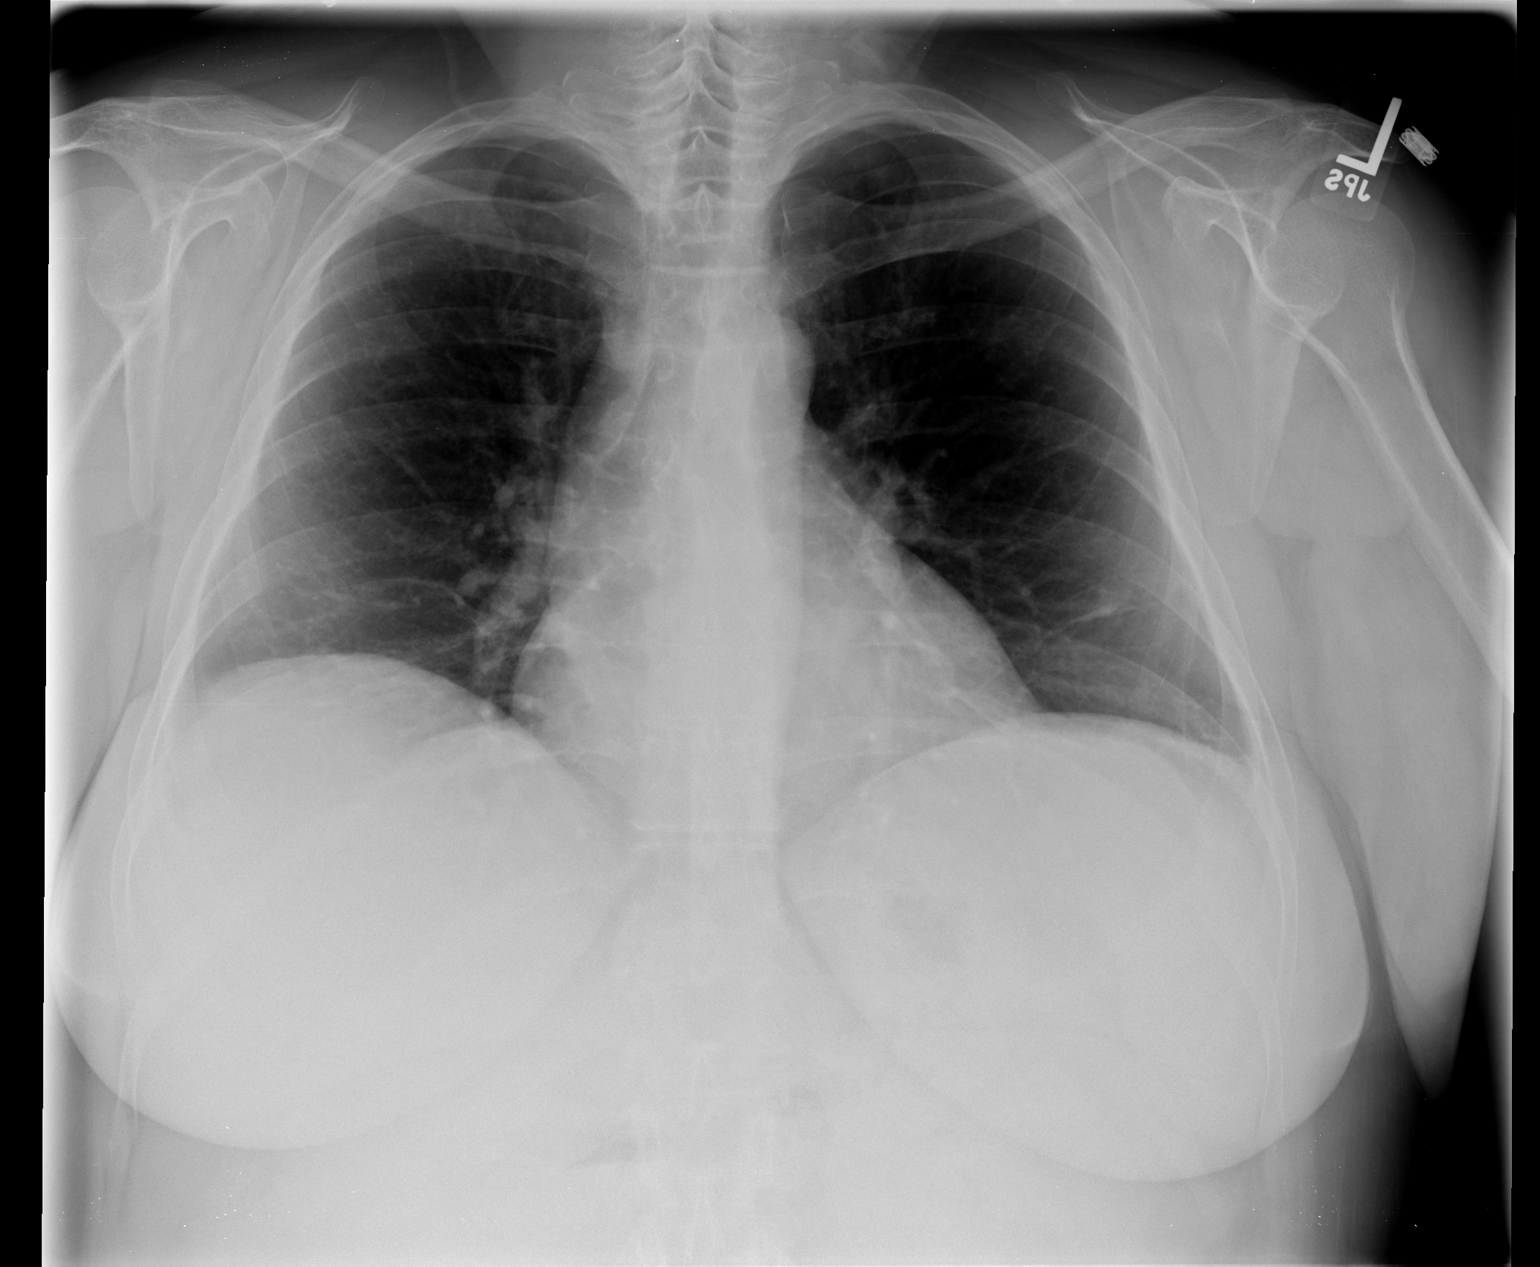

[view not recorded (2 of 2)]
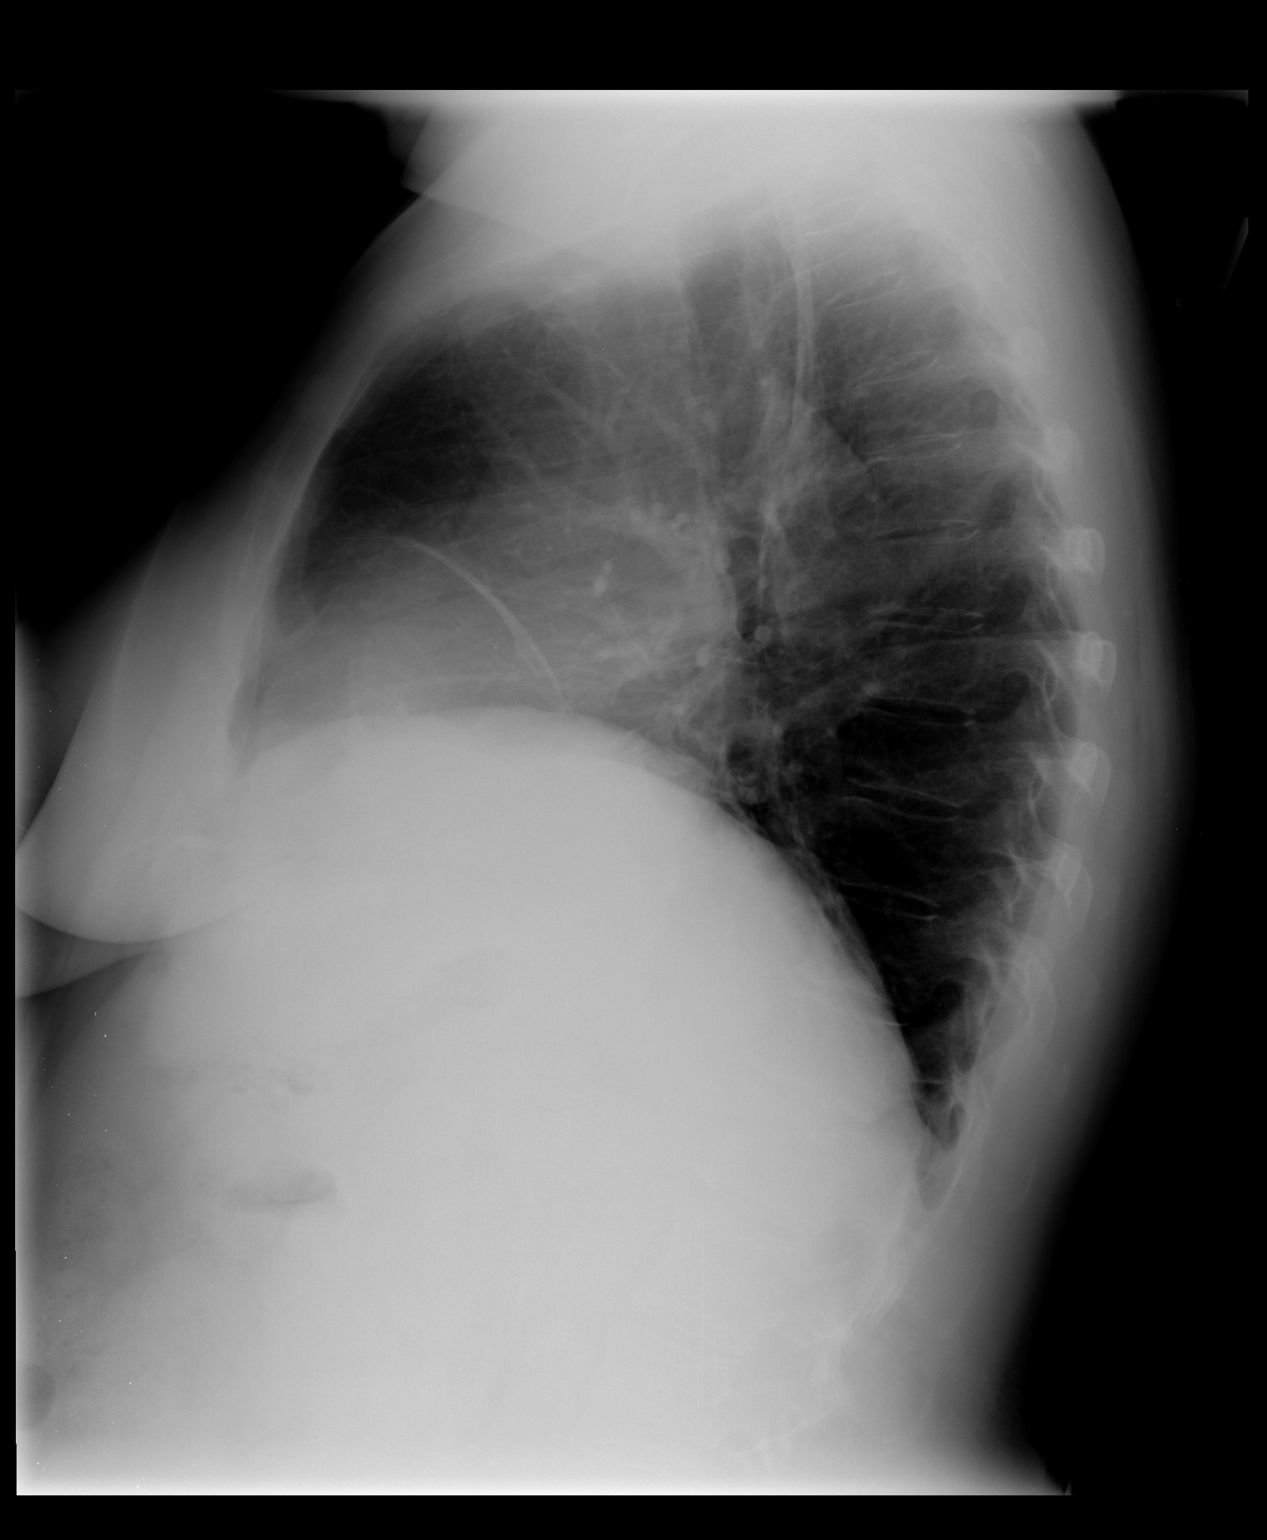

[2 of 2 positions shown; findings below may reference images not displayed]

FINDINGS: Linear areas of scarring in the lung bases.  Low lung
volumes.  Heart is normal size.  No acute opacities or effusions.
No acute bony abnormality.
IMPRESSION: Low lung volumes.  Bibasilar scarring.

## 2014-06-26 ENCOUNTER — Other Ambulatory Visit (HOSPITAL_COMMUNITY)
Admission: RE | Admit: 2014-06-26 | Discharge: 2014-06-26 | Disposition: A | Discharge status: Home / Self Care | Payer: Medicare Other | Source: Ambulatory Visit | Attending: Family Medicine | Admitting: Family Medicine

## 2014-06-26 ENCOUNTER — Other Ambulatory Visit: Payer: Self-pay | Admitting: Family Medicine

## 2014-06-26 DIAGNOSIS — Z124 Encounter for screening for malignant neoplasm of cervix: Secondary | ICD-10-CM | POA: Diagnosis present

## 2014-06-26 DIAGNOSIS — Z1151 Encounter for screening for human papillomavirus (HPV): Secondary | ICD-10-CM | POA: Diagnosis present

## 2014-06-28 LAB — CYTOLOGY - PAP

## 2014-07-24 ENCOUNTER — Encounter (INDEPENDENT_AMBULATORY_CARE_PROVIDER_SITE_OTHER): Payer: Self-pay

## 2014-07-24 ENCOUNTER — Encounter: Payer: Self-pay | Admitting: Pulmonary Disease

## 2014-07-24 ENCOUNTER — Ambulatory Visit (INDEPENDENT_AMBULATORY_CARE_PROVIDER_SITE_OTHER): Payer: Medicare Other | Admitting: Pulmonary Disease

## 2014-07-24 VITALS — BP 120/70 | HR 83 | Temp 97.7°F | Ht 61.0 in | Wt 203.2 lb

## 2014-07-24 DIAGNOSIS — R059 Cough, unspecified: Secondary | ICD-10-CM

## 2014-07-24 DIAGNOSIS — R05 Cough: Secondary | ICD-10-CM

## 2014-07-24 DIAGNOSIS — R053 Chronic cough: Secondary | ICD-10-CM | POA: Insufficient documentation

## 2014-07-24 NOTE — Patient Instructions (Addendum)
Will schedule for high resolution ct chest to evaluate for bronchiectasis and possible indolent opportunistic infection. Will check serum immunoglobulin levels, and can see if can be done at [facility] along with your other planned labs. Stop advair and atrovent inhalers now.  Can use proair 2 inhalations every 6 hrs if needed for worsening symptoms. Will schedule for breathing studies in 2 weeks, and will see you back the same day to review.  Please call if you feel your breathing is worsening before you have your breathing studies.

## 2014-07-24 NOTE — Assessment & Plan Note (Signed)
The patient is having recurring episodes of cough, increased shortness of breath. But only scant mucus production. She typically improves with a course of antibiotics and prednisone. She carries a diagnosis of asthma, but has never had pulmonary function studies. She is also on significant immunosuppressive medications for her kidney transplant. It is unclear whether her immune system has been significantly altered from her medications, and we'll therefore check serum immunoglobulins. Will also check a high-resolution CT to evaluate for bronchiectasis, as well as an indolent opportunistic infection.   Finally, I think it will be very important to find out if she really has asthma or not, since it inhaled corticosteroids in the presence of immunosuppression can increase her risk for pneumonia. Will schedule for pulmonary function studies off maintenance inhalers, and she continues rescue medication in the interim. Will also need to keep in mind the possibility of chronic sinus disease and contribution from laryngopharyngeal reflux.

## 2014-07-24 NOTE — Progress Notes (Signed)
   Subjective:    Patient ID: April [name], female    DOB: 1956/09/06, 58 y.o.   MRN: [id number]  HPI The patient is a 58 year old female who I've been asked to see for recurrent episodes of bronchitis. She has a history of a renal transplant in July of 2013, and is on multiple immunosuppressive medications. She also was diagnosed with asthma many years ago after having multiple bouts of "pneumonia". She has been on a standard bronchodilator regimen since that time. Over the last year or so she has been having recurring episodes where she develops myalgias, chest heaviness, as well as shortness of breath with cough productive of scant mucus. The mucous can be clear to slightly discolored. The patient tells me that she has had 4 episodes this year, and typically is treated with a prednisone taper and antibiotics. She typically will improve slowly, and it takes quite a while to get back to baseline. Her last episode was in August of this year. She tells me that she coughs quite a bit in the mornings and also at night upon lying down. She has postnasal drip and occasional sinus pressure, but tells me that she has not had issues with recurrent sinusitis. She also has a history of reflux for which she is on Protonix, and has rare breakthrough symptoms. The patient has a history of chronic dyspnea on exertion, but feels it is related to deconditioning. She tells me that her weight is up 40 pounds after her transplant because of high-dose prednisone, and she has lost 20 of those recently. She had a chest x-ray in March of this year with her primary care physician, and the results are currently unavailable. To her knowledge, she has never had spirometry or pulmonary function studies.   Review of Systems  Constitutional: Negative for fever and unexpected weight change.  HENT: Negative for congestion, dental problem, ear pain, nosebleeds, postnasal drip, rhinorrhea, sinus pressure, sneezing, sore throat and  trouble swallowing.   Eyes: Negative for redness and itching.  Respiratory: Positive for cough, shortness of breath and wheezing. Negative for chest tightness.   Cardiovascular: Negative for palpitations and leg swelling.  Gastrointestinal: Negative for nausea and vomiting.  Genitourinary: Negative for dysuria.  Musculoskeletal: Negative for joint swelling.  Skin: Negative for rash.  Neurological: Negative for headaches.  Hematological: Does not bruise/bleed easily.  Psychiatric/Behavioral: Positive for dysphoric mood. The patient is nervous/anxious.        Objective:   Physical Exam Constitutional:  Overweight female, no acute distress  HENT:  Nares patent without discharge  Oropharynx without exudate, palate and uvula are normal  Eyes:  Perrla, eomi, no scleral icterus  Neck:  No JVD, no TMG  Cardiovascular:  Normal rate, regular rhythm, no rubs or gallops.  ?diastolic murmur        Intact distal pulses  Pulmonary :  Normal breath sounds, no stridor or respiratory distress   No rales, rhonchi, or wheezing  Abdominal:  Soft, nondistended, bowel sounds present.  No tenderness noted.   Musculoskeletal:  mild lower extremity edema noted.  Lymph Nodes:  No cervical lymphadenopathy noted  Skin:  No cyanosis noted  Neurologic:  Alert, appropriate, moves all 4 extremities without obvious deficit.         Assessment & Plan:

## 2014-07-27 ENCOUNTER — Ambulatory Visit (INDEPENDENT_AMBULATORY_CARE_PROVIDER_SITE_OTHER)
Admission: RE | Admit: 2014-07-27 | Discharge: 2014-07-27 | Disposition: A | Discharge status: Home / Self Care | Payer: Medicare Other | Source: Ambulatory Visit | Attending: Pulmonary Disease | Admitting: Pulmonary Disease

## 2014-07-27 DIAGNOSIS — R05 Cough: Secondary | ICD-10-CM

## 2014-07-27 DIAGNOSIS — R059 Cough, unspecified: Secondary | ICD-10-CM

## 2014-07-27 DIAGNOSIS — R053 Chronic cough: Secondary | ICD-10-CM

## 2014-08-03 ENCOUNTER — Telehealth: Payer: Self-pay | Admitting: Pulmonary Disease

## 2014-08-03 NOTE — Telephone Encounter (Signed)
Please let pt know that her chest ct looks good. We need to track down her serum immunoglobulin levels that were sent to [facility]. They can probably fax over. She is supposed to have a f/u visit next week with pfts.   I spoke with patient about results and she verbalized understanding. She had lab work done at [facility] [employer]. Will need to call Monday to get results. Pt is scheduled for PFT 9/16 w/ OV afterwards with [facility].

## 2014-08-06 NOTE — Telephone Encounter (Signed)
Called [facility]. wcb NA  [phone]

## 2014-08-06 NOTE — Telephone Encounter (Signed)
Called # below. Was told to call [phone]. I called spoke with Legrand Como w/ [facility]. Will await fax.

## 2014-08-06 NOTE — Telephone Encounter (Signed)
Results received and placed in [place] green folder. Please advise thanks

## 2014-08-08 ENCOUNTER — Encounter: Payer: Self-pay | Admitting: Pulmonary Disease

## 2014-08-08 ENCOUNTER — Ambulatory Visit (INDEPENDENT_AMBULATORY_CARE_PROVIDER_SITE_OTHER): Payer: Medicare Other | Admitting: Pulmonary Disease

## 2014-08-08 ENCOUNTER — Ambulatory Visit (HOSPITAL_COMMUNITY)
Admission: RE | Admit: 2014-08-08 | Discharge: 2014-08-08 | Disposition: A | Discharge status: Home / Self Care | Payer: Medicare Other | Source: Ambulatory Visit | Attending: Pulmonary Disease | Admitting: Pulmonary Disease

## 2014-08-08 VITALS — BP 138/70 | HR 111 | Temp 98.0°F | Ht 61.0 in | Wt 201.0 lb

## 2014-08-08 DIAGNOSIS — R0602 Shortness of breath: Secondary | ICD-10-CM | POA: Diagnosis not present

## 2014-08-08 DIAGNOSIS — R053 Chronic cough: Secondary | ICD-10-CM

## 2014-08-08 DIAGNOSIS — F17200 Nicotine dependence, unspecified, uncomplicated: Secondary | ICD-10-CM | POA: Diagnosis not present

## 2014-08-08 DIAGNOSIS — R059 Cough, unspecified: Secondary | ICD-10-CM

## 2014-08-08 DIAGNOSIS — R05 Cough: Secondary | ICD-10-CM | POA: Diagnosis not present

## 2014-08-08 DIAGNOSIS — J411 Mucopurulent chronic bronchitis: Secondary | ICD-10-CM

## 2014-08-08 LAB — PULMONARY FUNCTION TEST
DL/VA % PRED: 98 %
DL/VA: 4.33 ml/min/mmHg/L
DLCO unc % pred: 57 %
DLCO unc: 11.68 ml/min/mmHg
FEF 25-75 PRE: 1.3 L/s
FEF 25-75 Post: 1.7 L/sec
FEF2575-%CHANGE-POST: 30 %
FEF2575-%PRED-POST: 74 %
FEF2575-%PRED-PRE: 56 %
FEV1-%CHANGE-POST: 8 %
FEV1-%PRED-PRE: 57 %
FEV1-%Pred-Post: 61 %
FEV1-PRE: 1.35 L
FEV1-Post: 1.46 L
FEV1FVC-%Change-Post: 2 %
FEV1FVC-%PRED-PRE: 101 %
FEV6-%Change-Post: 6 %
FEV6-%Pred-Post: 60 %
FEV6-%Pred-Pre: 56 %
FEV6-POST: 1.77 L
FEV6-Pre: 1.66 L
FEV6FVC-%PRED-PRE: 103 %
FEV6FVC-%Pred-Post: 103 %
FVC-%CHANGE-POST: 5 %
FVC-%Pred-Post: 58 %
FVC-%Pred-Pre: 55 %
FVC-Post: 1.77 L
FVC-Pre: 1.69 L
POST FEV1/FVC RATIO: 82 %
PRE FEV1/FVC RATIO: 80 %
Post FEV6/FVC ratio: 100 %
Pre FEV6/FVC Ratio: 100 %
RV % pred: 275 %
RV: 4.9 L
TLC % PRED: 145 %
TLC: 6.68 L

## 2014-08-08 MED ORDER — ALBUTEROL SULFATE (2.5 MG/3ML) 0.083% IN NEBU
2.5000 mg | INHALATION_SOLUTION | Freq: Once | RESPIRATORY_TRACT | Status: AC
  Administered 2014-08-08: 2.5 mg via RESPIRATORY_TRACT

## 2014-08-08 NOTE — Progress Notes (Signed)
   Subjective:    Patient ID: April [name], female    DOB: [birth year], 58 y.o.   MRN: [id number]  HPI The patient comes in today for followup of her recent lab work, PFTs, and HRCT as part of a workup for recurring episodes of cough and congestion with scant mucus. She has a questionable history of asthma, but has never had pulmonary function studies. Her PFTs today show no airflow obstruction by her FEV1 to FVC ratio, but her FVC and FEV1 are moderately decreased probably secondary to restriction from obesity. Her flow volume loop is suggestive of subtle airflow obstruction, but it is not overly impressive. Her lung volumes are an accurate, and her diffusion capacity is 57% of predicted but corrects to normal with alveolar volume adjustment. The patient's high-resolution CT showed no evidence of bronchiectasis, interstitial lung disease, or other parenchymal abnormality. 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 Her serum immunoglobulins were totally normal. I have reviewed all of the above studies with the patient, and answered all of her questions.   Review of Systems  Constitutional: Negative for fever and unexpected weight change.  HENT: Positive for congestion and postnasal drip. Negative for dental problem, ear pain, nosebleeds, rhinorrhea, sinus pressure, sneezing, sore throat and trouble swallowing.   Eyes: Negative for redness and itching.  Respiratory: Positive for cough, chest tightness, shortness of breath and wheezing. Negative for apnea.   Cardiovascular: Negative for palpitations and leg swelling.  Gastrointestinal: Negative for nausea and vomiting.  Genitourinary: Negative for dysuria.  Musculoskeletal: Negative for joint swelling.  Skin: Negative for rash.  Neurological: Negative for headaches.  Hematological: Does not bruise/bleed easily.  Psychiatric/Behavioral: Negative for dysphoric mood. The patient is not nervous/anxious.          Objective:   Physical Exam Overweight female in no acute distress  Nose without purulence or discharge noted Neck without lymphadenopathy or thyromegaly Chest fairly clear Exam with regular rate and rhythm Lower extremities with mild edema, no cyanosis Alert and oriented, moves all 4 extremities.       Assessment & Plan:

## 2014-08-08 NOTE — Patient Instructions (Signed)
I suspect asthma is not the cause of your recurrent pulmonary symptoms, but will try you on symbicort 80/4.5  2 puffs am and pm everyday for next 4 weeks.  Please rinse mouth well. Will schedule for xray of your sinuses, and will call you with results. Would discuss your immunosuppression with your transplant team, to see if this can cause you to have recurrent infections.  Please call me in 4 weeks to give update on how things are going with the symbicort.

## 2014-08-08 NOTE — Assessment & Plan Note (Signed)
The patient has no evidence for bronchiectasis on her high-resolution CT, and there is no evidence for hypogammaglobulinemia. One possibility for her recurrent pulmonary symptoms/infection is that it may be related to her aggressive immunosuppressive regimen. I have asked her to discuss this possibility with her transplant team. Her pulmonary function studies are not overly impressive, but there is the suggestion of subtle airflow obstruction by her flow volume loop. Her history is suggestive of recurrent asthmatic bronchitis, and therefore I would like to try her on a LABA/ICS to see if that makes a difference. I will use an inhaler with a low inhaled steroid dose since she is on low-dose prednisone for her transplant. I would also like to do a scan of her sinuses to make sure that is not the source of her recurrent infections/symptoms. If this is unremarkable, I can really find no source for her recurrent symptoms, and we need to strongly consider the role that her immunosuppression may be playing.

## 2014-08-10 ENCOUNTER — Ambulatory Visit (INDEPENDENT_AMBULATORY_CARE_PROVIDER_SITE_OTHER)
Admission: RE | Admit: 2014-08-10 | Discharge: 2014-08-10 | Disposition: A | Discharge status: Home / Self Care | Payer: Medicare Other | Source: Ambulatory Visit | Attending: Pulmonary Disease | Admitting: Pulmonary Disease

## 2014-08-10 DIAGNOSIS — R053 Chronic cough: Secondary | ICD-10-CM

## 2014-08-10 DIAGNOSIS — R059 Cough, unspecified: Secondary | ICD-10-CM

## 2014-08-10 DIAGNOSIS — R05 Cough: Secondary | ICD-10-CM

## 2014-08-10 NOTE — Telephone Encounter (Signed)
The serum immunoglobulins were discussed at the 9.16.15 ov with [place] As pt is aware of the lab and CT results per this note and that visit, will sign off on message.

## 2014-08-13 NOTE — Progress Notes (Signed)
Quick Note:  lmomtcb x1 ______ 

## 2014-08-22 ENCOUNTER — Encounter: Payer: Self-pay | Admitting: Pulmonary Disease

## 2014-09-13 ENCOUNTER — Telehealth: Payer: Self-pay | Admitting: Pulmonary Disease

## 2014-09-13 DIAGNOSIS — R05 Cough: Secondary | ICD-10-CM

## 2014-09-13 DIAGNOSIS — R053 Chronic cough: Secondary | ICD-10-CM

## 2014-09-13 NOTE — Telephone Encounter (Signed)
Called and spoke with pt and she stated that she has been using the symbicort and she is not able to tell a difference---she stated that she is having problems at night after laying down of catching her breath. She is also c/o throbbing/pulsating in her chest, and this can happen anytime.  She is having SOB even with the use of the symbicort.  She is also having to use her rescue inhaler as well.  Wanted to call and let [place] know. Any further recs from [facility]--please advise. Thanks  Allergies  Allergen Reactions  . Infed [Iron Dextran] Other (See Comments)    Chest tightness  . Pentamidine Itching, Shortness Of Breath and Swelling  . Erythromycin [Erythromycin] Other (See Comments)    Mouth Ulcers  . Oxycodone Nausea Only and Nausea And Vomiting  . Erythromycin Rash    Causes breakout in mouth  . Ultram [Tramadol Hcl] Anxiety    Current Outpatient Prescriptions on File Prior to Visit  Medication Sig Dispense Refill  . allopurinol (ZYLOPRIM) 100 MG tablet Take 200 mg by mouth daily.      [name] aspirin EC 81 MG tablet Take 81 mg by mouth daily.      . citalopram (CELEXA) 20 MG tablet Take 20 mg by mouth daily.      . fexofenadine (ALLEGRA) 30 MG/5ML suspension Take 30 mg by mouth as needed.      . furosemide (LASIX) 40 MG tablet Take 1 tablet by mouth daily.      [name] ipratropium (ATROVENT HFA) 17 MCG/ACT inhaler Inhale 2 puffs into the lungs every 6 (six) hours as needed.       . magnesium oxide (MAG-OX) 400 MG tablet Take 400 mg by mouth 2 (two) times daily.      . mycophenolate (MYFORTIC) 180 MG EC tablet Take 180 mg by mouth 2 (two) times daily.      . pantoprazole (PROTONIX) 40 MG tablet Take 40 mg by mouth daily.      . predniSONE (DELTASONE) 5 MG tablet Take 2.5-5 mg by mouth 2 (two) times daily. Take 1 tablet every morning and take 1/2 tablet every evening      . tacrolimus (PROGRAF) 1 MG capsule Take 1 mg by mouth 2 (two) times daily.      . vitamin B-12 (CYANOCOBALAMIN) 100 MCG tablet Take 100  mcg by mouth daily.       No current facility-administered medications on file prior to visit.

## 2014-09-14 NOTE — Telephone Encounter (Signed)
Spoke with the pt and notified of recs per [facility]  She verbalized understanding and verbalized understanding  Order for MCT was sent to [facility]

## 2014-09-14 NOTE — Telephone Encounter (Signed)
If symbicort is not helping, ok to stop Would like to put the issue of asthma to rest, and need to schedule for methacholine challenge testing.  She can use rescue inhaler until study is complete.  See if can be done soon Let her know that other than asthma, I have no explanation for her breathing difficulties and chest discomfort.  If asthma is ruled out, the next step may be what is called a cardiopulmonary exercise test to evaluate further.

## 2014-10-09 IMAGING — CR DG CHEST 2V
2 series · 2 of 2 positions shown · non-contrast
Comparison: 08/14/2012 11/11/2006.

CLINICAL DATA: Left flank pain and cough

CHEST - 2 VIEW

[w chest pa]
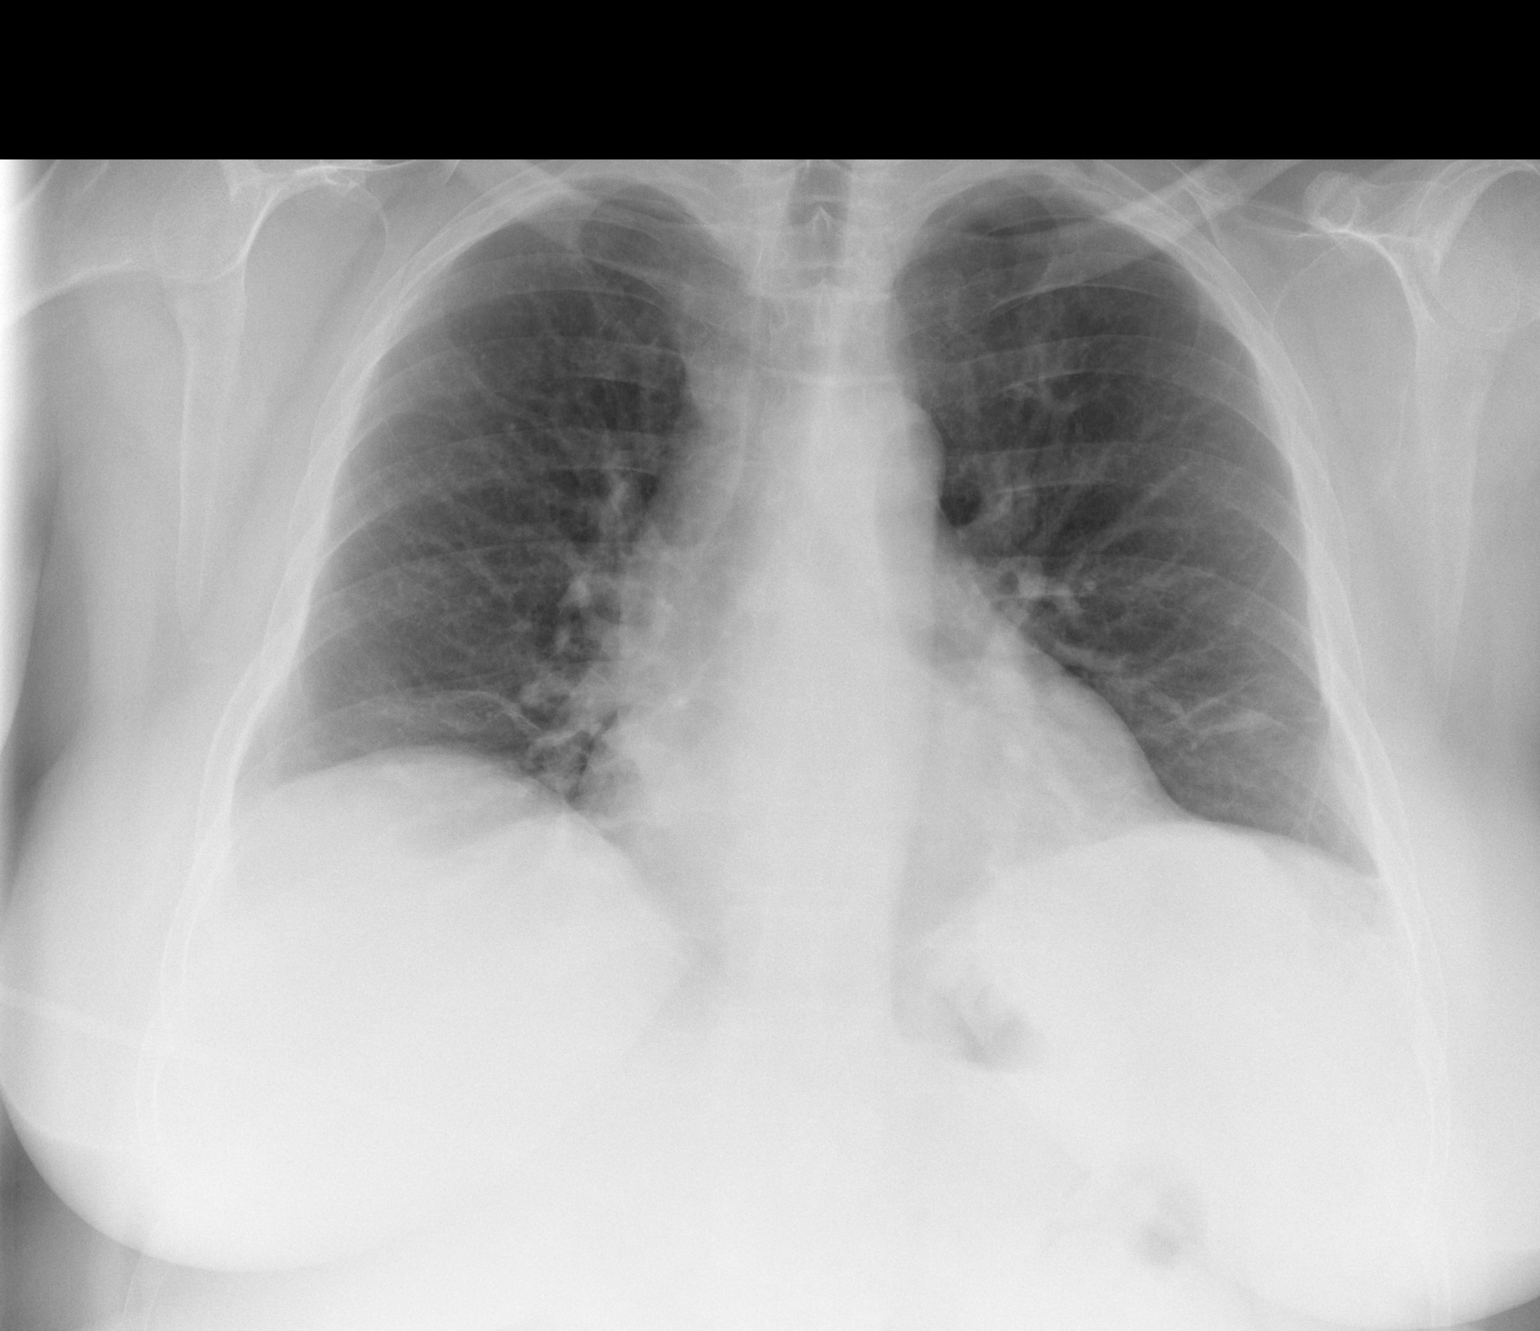

[w chest lat]
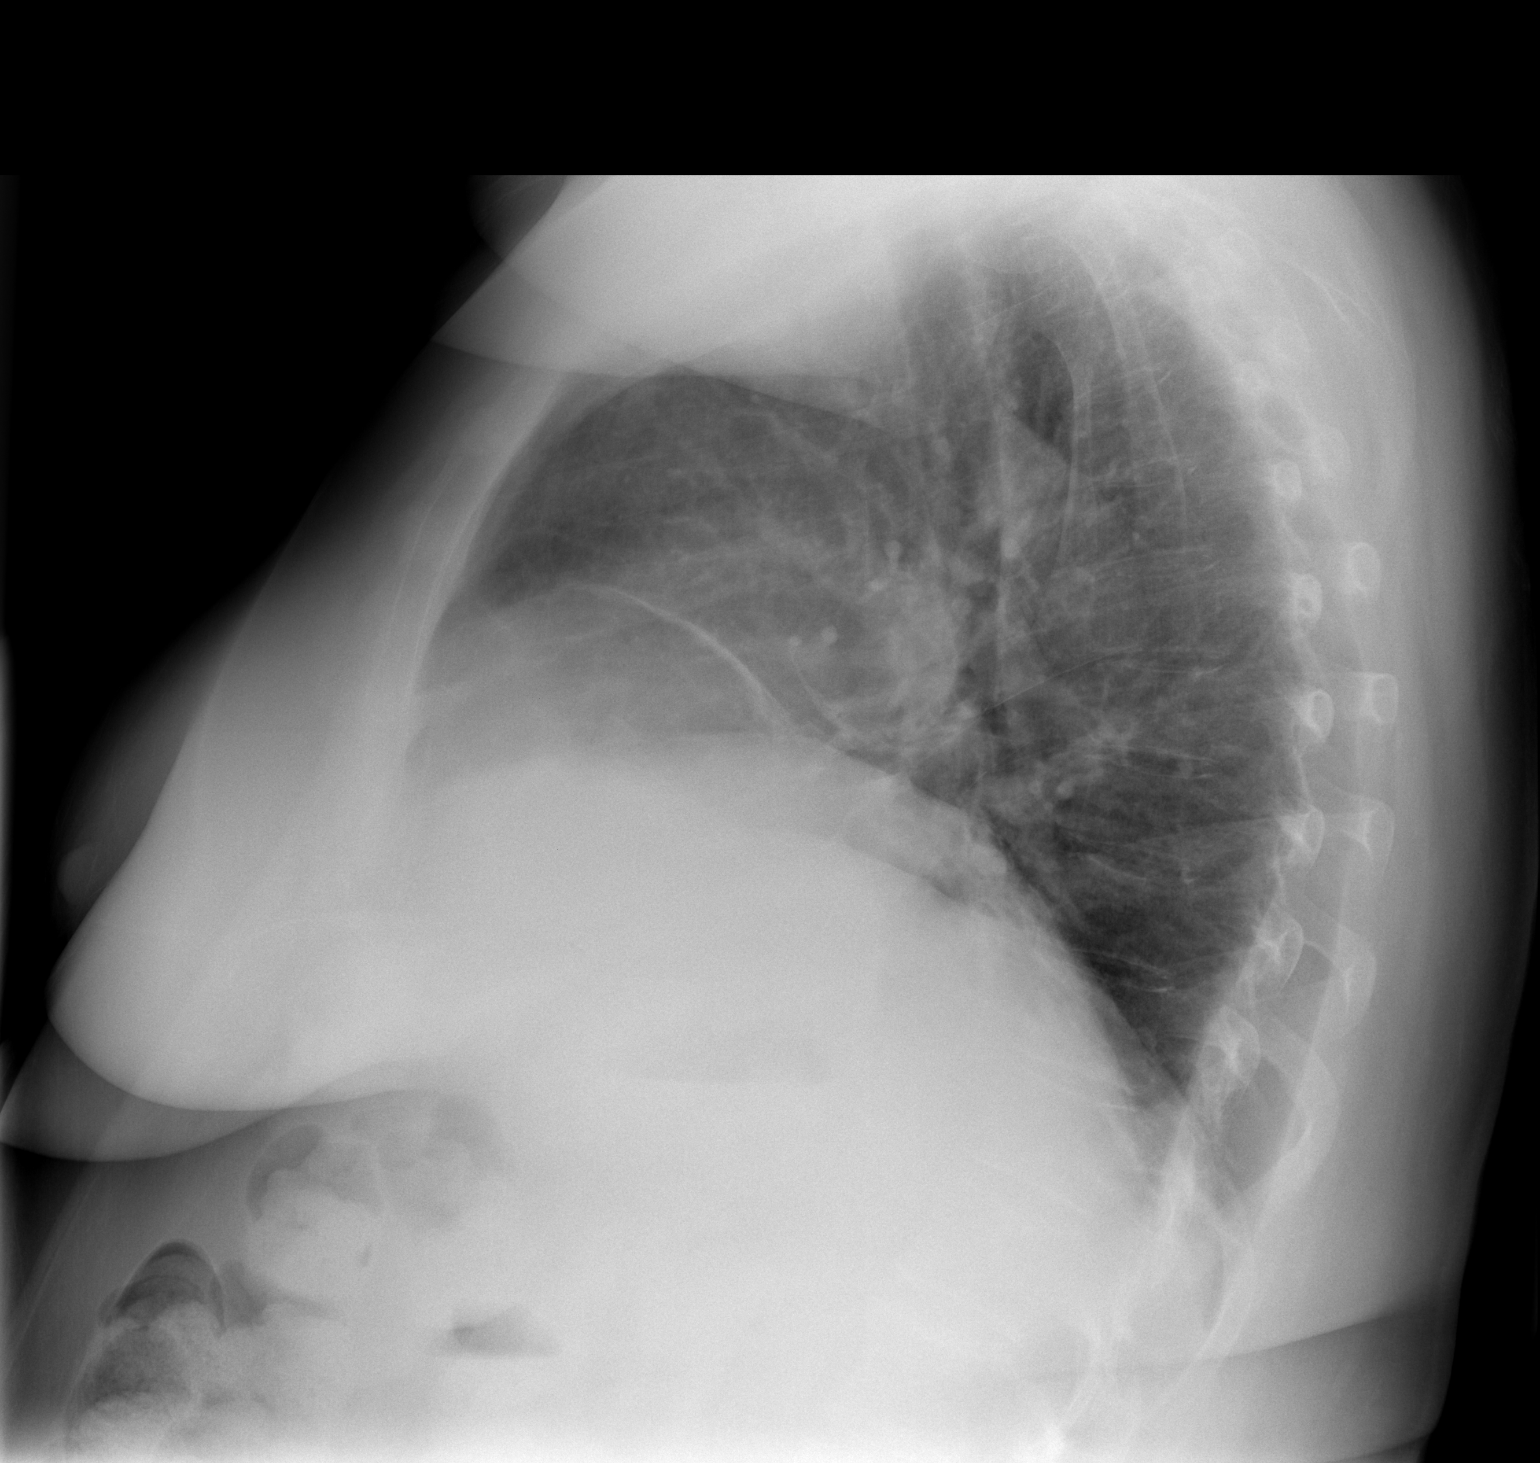

[2 of 2 positions shown; findings below may reference images not displayed]

FINDINGS: Hypoaeration.  Linear lung base opacities and right
middle lobe.  Nodular component left lung base associated with the
scarring is similar.  Heart size upper normal.  Mediastinal
contours otherwise within normal range allowing for inspiratory
effort.  No pleural effusion or pneumothorax.  No acute osseous
finding.
IMPRESSION: Mild opacities at the bases and right middle lobe, favor
atelectasis and/or scarring.

## 2014-11-10 ENCOUNTER — Other Ambulatory Visit: Payer: Self-pay | Admitting: Nurse Practitioner

## 2014-11-19 IMAGING — US US ABDOMEN COMPLETE
1 series · 13 of 25 positions shown · non-contrast
Comparison: CT 04/13/2011

CLINICAL DATA: Right upper quadrant abdominal pain.

COMPLETE ABDOMINAL ULTRASOUND

[Series 1: us abdomen complete · 0.30mm/px · 13 of 87 slices shown]
[im 1/87]
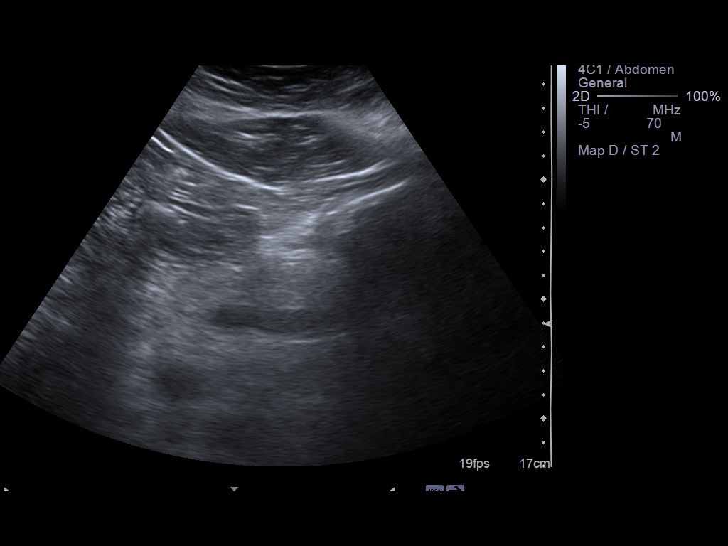
[im 8/87]
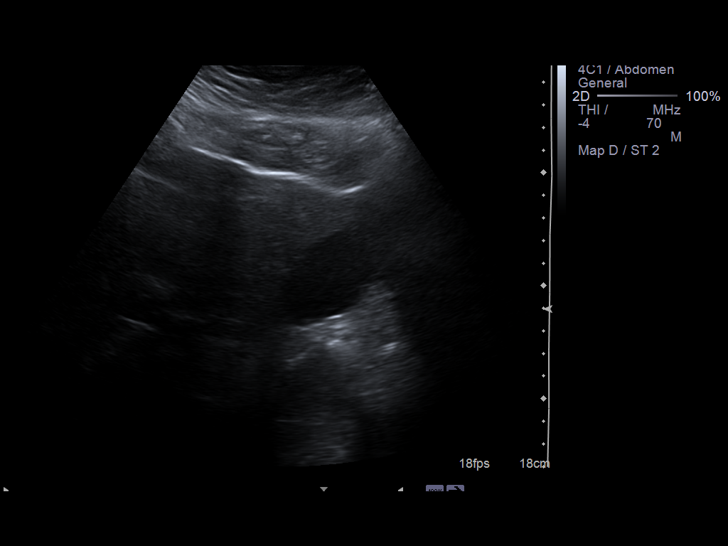
[im 15/87]
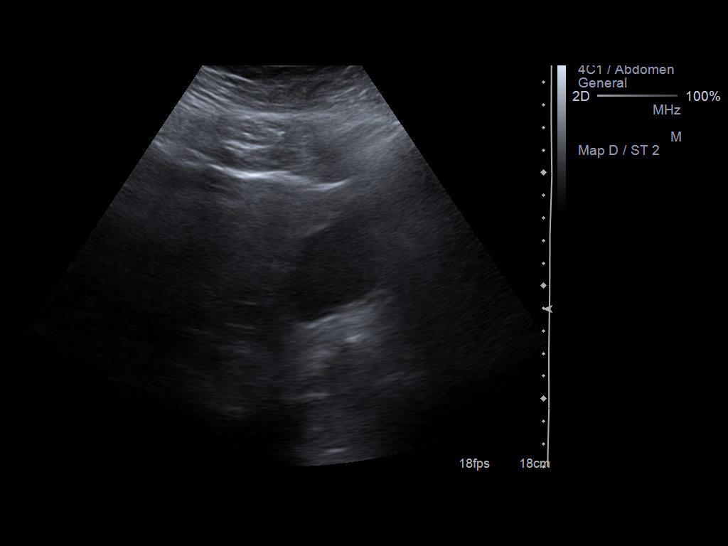
[im 22/87]
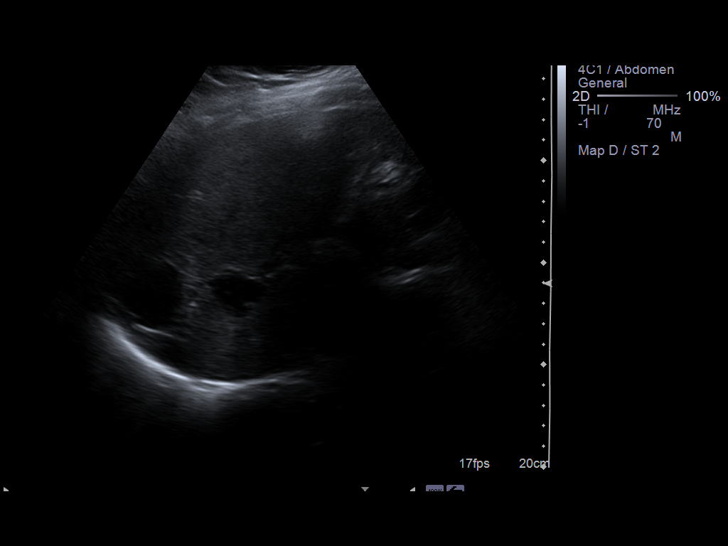
[im 29/87]
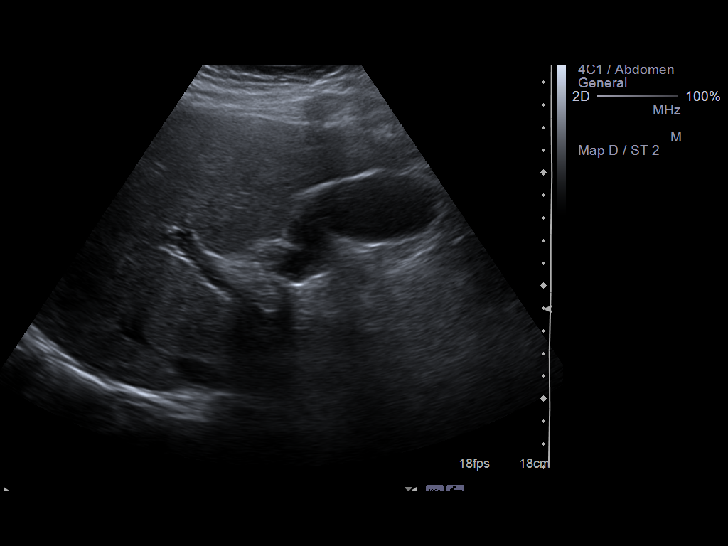
[im 36/87]
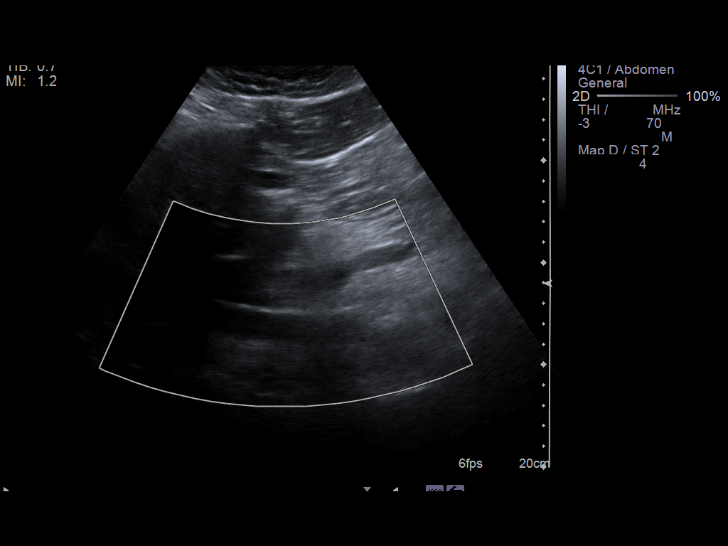
[im 44/87]
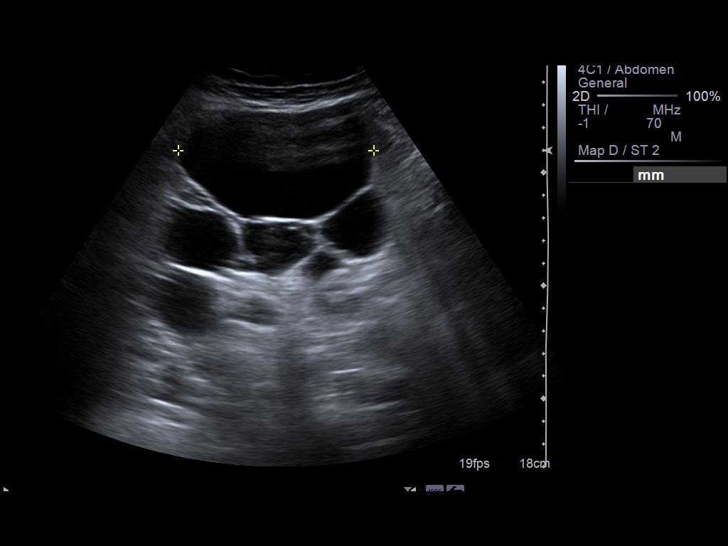
[im 51/87]
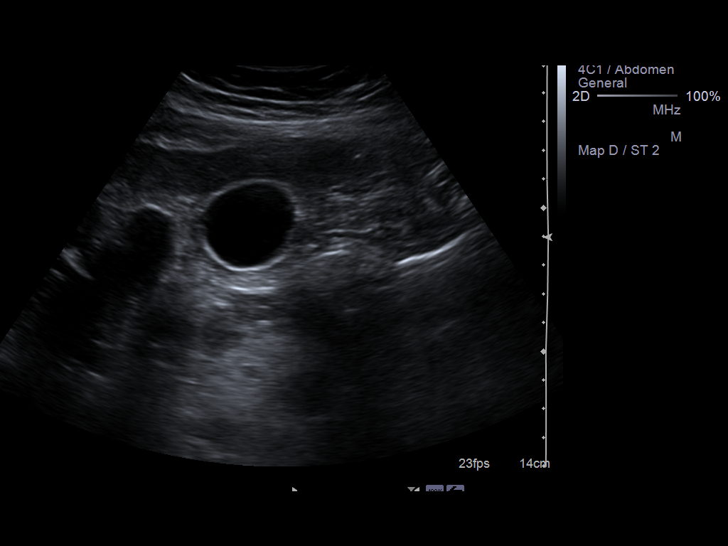
[im 58/87]
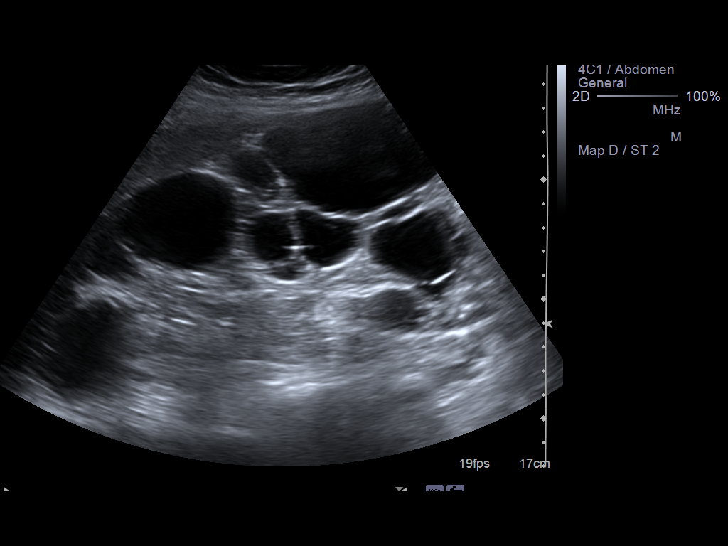
[im 65/87]
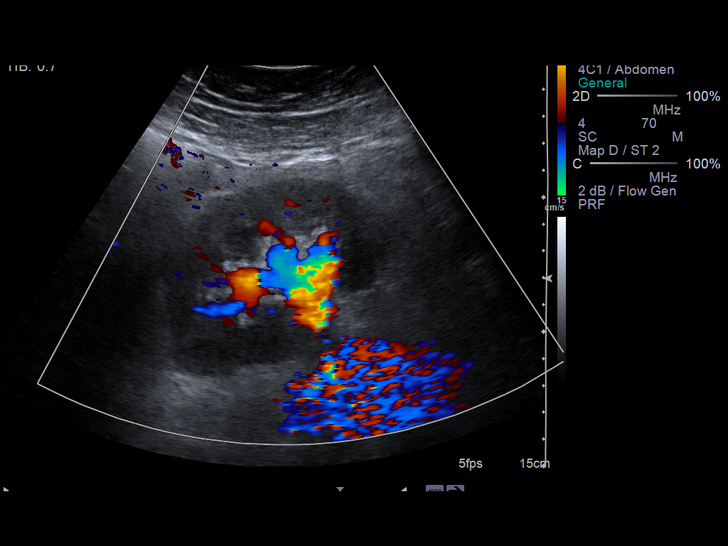
[im 72/87]
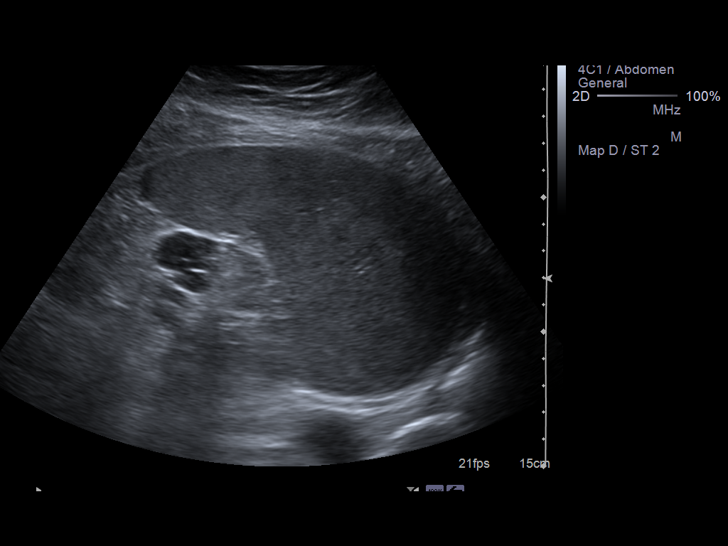
[im 79/87]
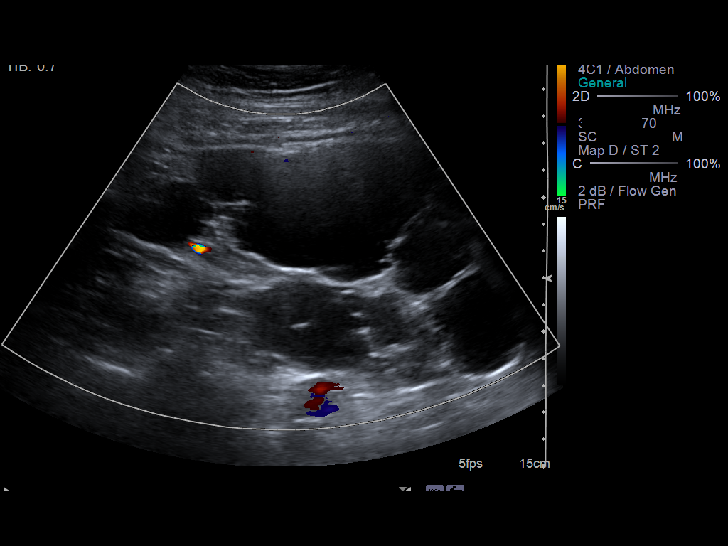
[im 87/87]
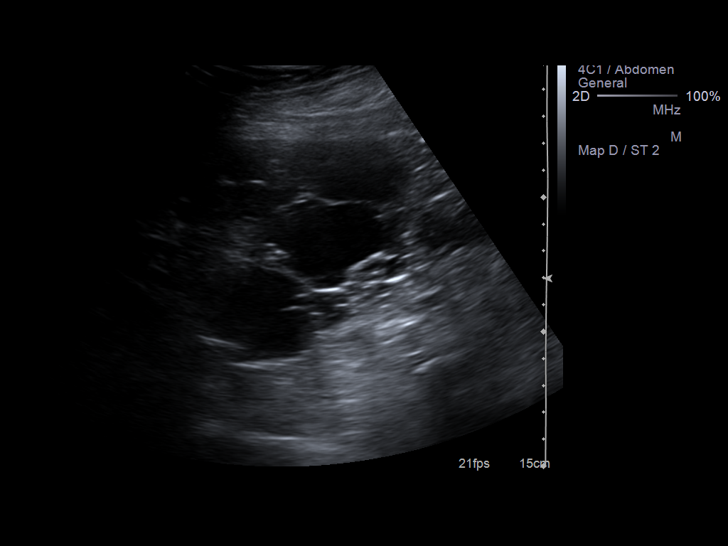

[13 of 25 positions shown; findings below may reference images not displayed]

FINDINGS: Gallbladder:  Normal, without wall thickening, stone, or
pericholecystic fluid.  Sonographic Murphy's sign was not elicited.

Common bile duct: Normal, 4 mm

Liver: Multiple hepatic cysts.  Right liver lobe 3.9 cm lesion on
image 24.  A left liver lobe 3.9 cm lesion may have mild complexity
on image 12.

IVC: Negative

Pancreas:  Poorly visualized due to overlying bowel gas.

Spleen:  Normal in size and echogenicity.

Right Kidney:  20.1 cm.  Multiple renal cysts.  The largest
measures 8.7 cm.

Left Kidney:  19.3 cm. No hydronephrosis.  Multiple renal cysts.
The largest measures 9.2 cm.

Abdominal aorta:  Partially obscured distally by bowel gas.  No
aneurysm or ascites.

Incidental note is made of right lower quadrant renal transplant.
This measures 11.6 cm.  No evidence of transplant hydronephrosis or
peritransplant fluid collection.
IMPRESSION: 1.  Findings of polycystic liver/kidney disease.
2.  No other explanation for abdominal pain.
3.  Normal appearance of right lower quadrant renal transplant.
4. Portions of anatomy obscured by overlying bowel gas.

## 2014-11-19 IMAGING — CT CT ABD-PELV W/O CM
2 of 4 series · 16 of 46 positions shown, 18 images · non-contrast
Comparison: Ultrasound earlier today.  Most recent CT of
04/13/2011.

CLINICAL DATA: Right-sided flank and abdominal pain.  History
kidney transplant.

CT ABDOMEN AND PELVIS WITHOUT CONTRAST (CT UROGRAM)
TECHNIQUE: Contiguous axial images of the abdomen and pelvis
without oral or intravenous contrast were obtained.

[Series 2: stone >200 lbs 5.0 b31f st · axial · 0.82mm/px · z∈[-512,-42]mm · 13 of 104 slices shown, 15 images]
[im 5/104  soft-tissue]
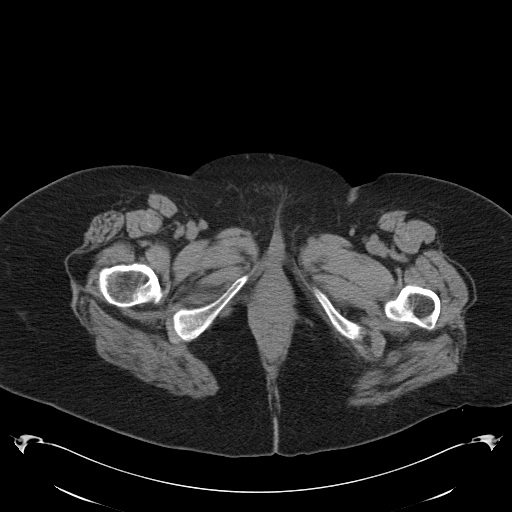
[im 5/104  bone]
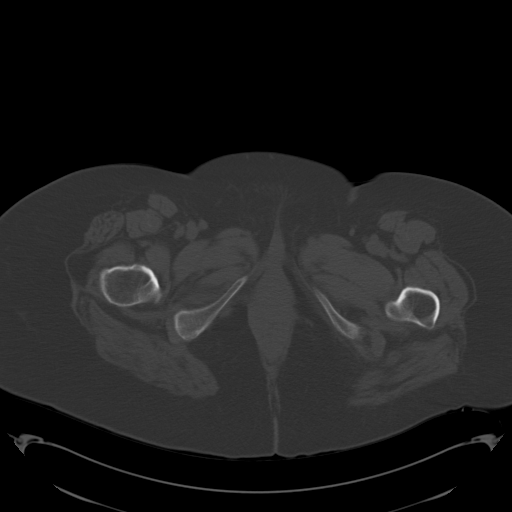
[im 13/104  soft-tissue]
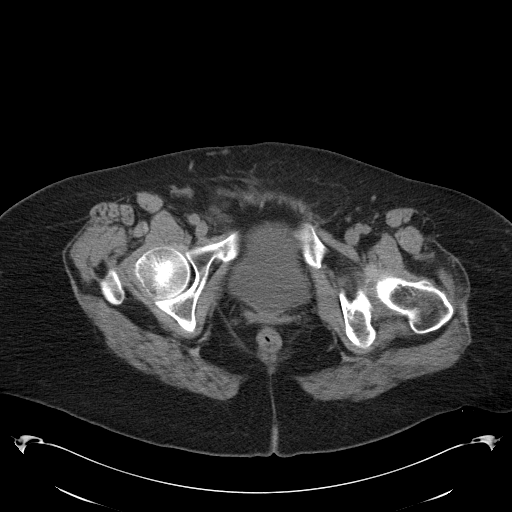
[im 22/104  soft-tissue]
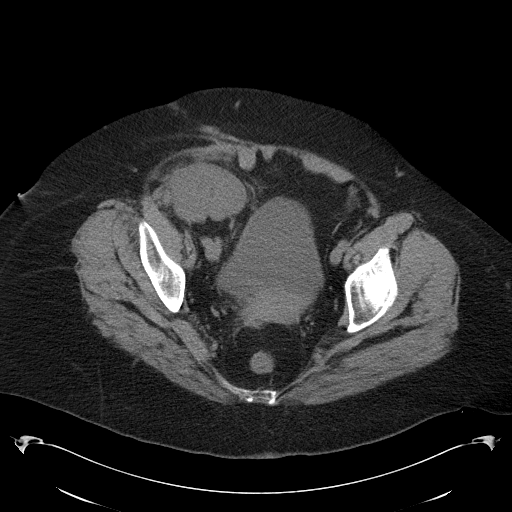
[im 31/104  soft-tissue]
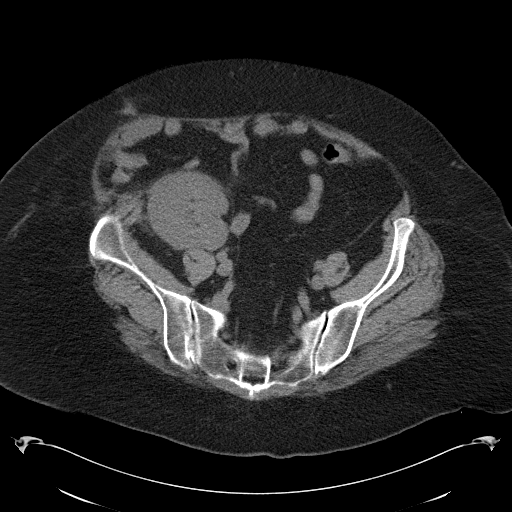
[im 35/104  soft-tissue]
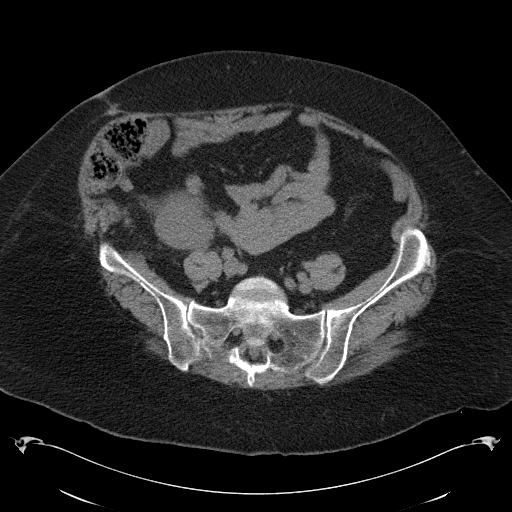
[im 43/104  soft-tissue]
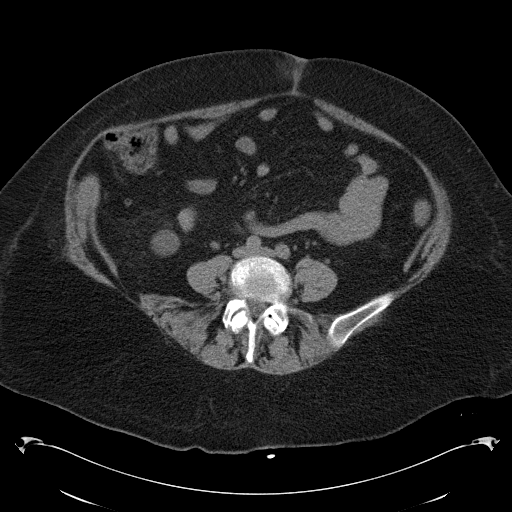
[im 52/104  soft-tissue]
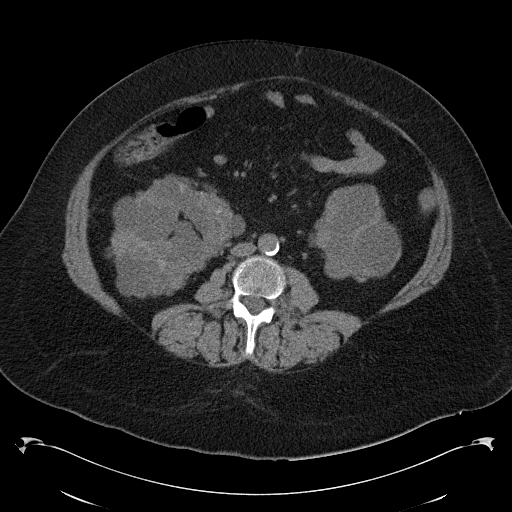
[im 61/104  soft-tissue]
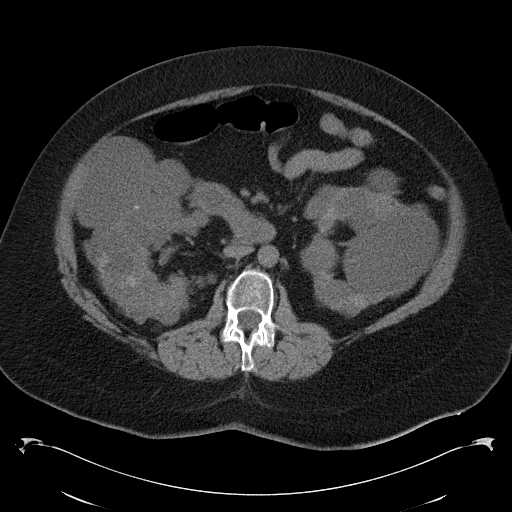
[im 69/104  soft-tissue]
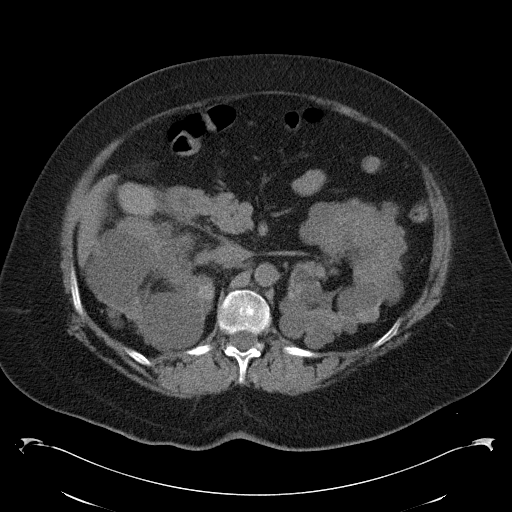
[im 69/104  bone]
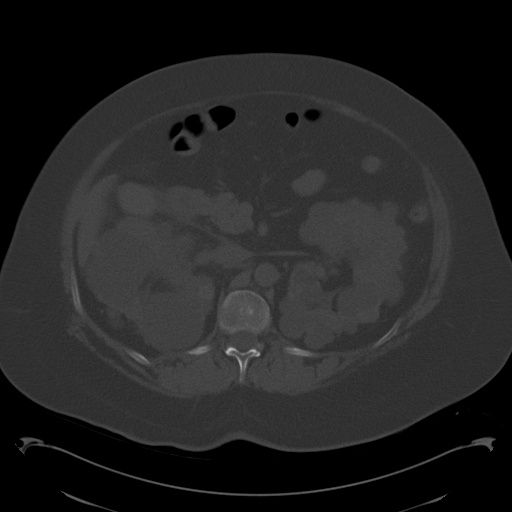
[im 73/104  soft-tissue]
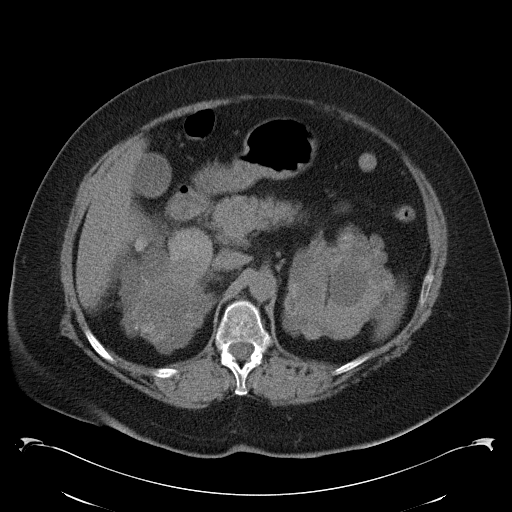
[im 82/104  soft-tissue]
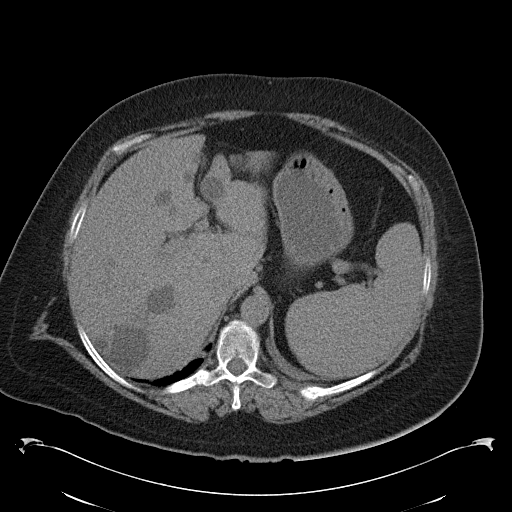
[im 91/104  soft-tissue]
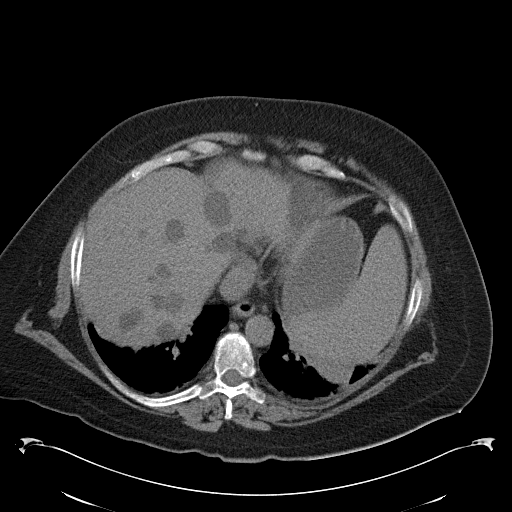
[im 99/104  soft-tissue]
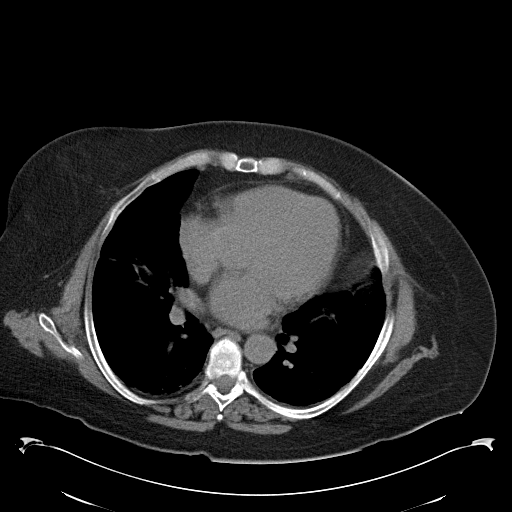

[Series 5: coronals cor · coronal · 0.87mm/px · 3 of 142 slices shown]
[im 48/142  soft-tissue]
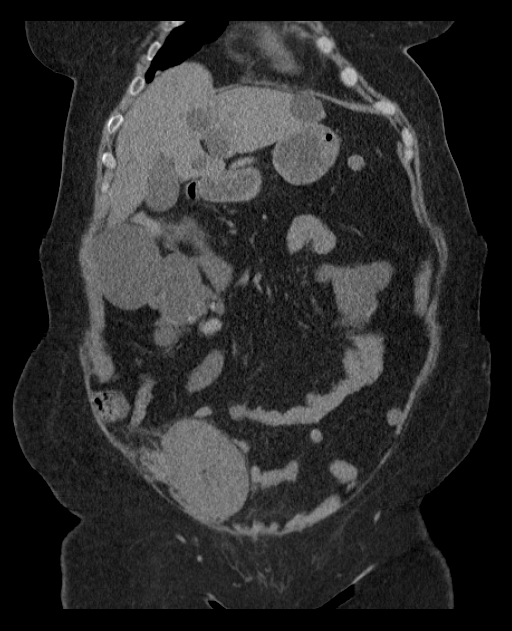
[im 63/142  soft-tissue]
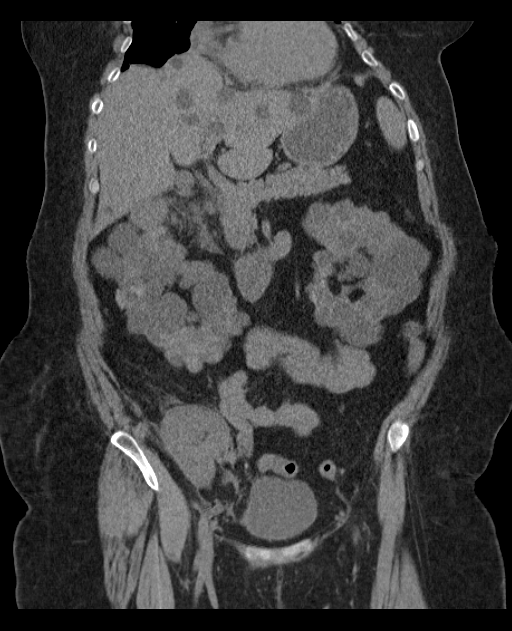
[im 79/142  soft-tissue]
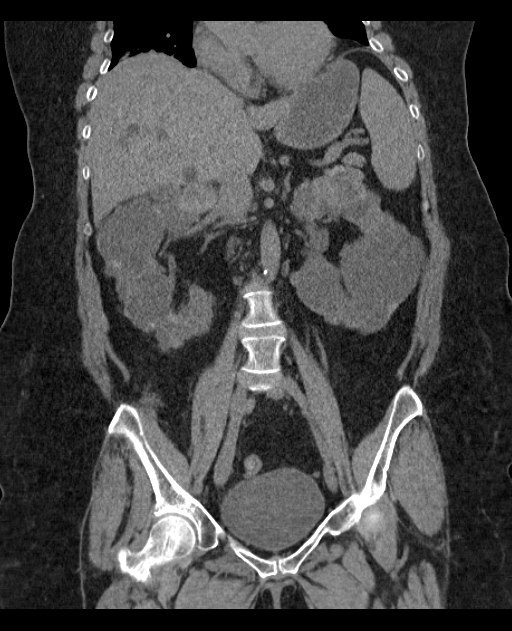

[16 of 46 positions shown; findings below may reference images not displayed]

FINDINGS: Exam is limited for evaluation of entities other than
urinary tract calculi due to lack of oral or intravenous contrast.

 Lung bases:  Subsegmental atelectasis at the lung bases.
Cardiomegaly, without pericardial or pleural effusion.

Abdomen/pelvis:  Multiple hepatic cysts, similar in size and
configuration on the prior exam.  Normal spleen, stomach, pancreas,
gallbladder, biliary tract, adrenal glands.

Enlarged kidneys with multiple renal masses bilaterally.  Many of
these have complexity. No hydronephrosis.  Bilateral renal
parenchymal calcifications, likely associated with cysts.  No renal
collecting system calculi. No retroperitoneal or retrocrural
adenopathy.

Mild motion degradation in the mid abdomen.

Normal colon, appendix, and terminal ileum.  Normal small bowel
without abdominal ascites.

No pelvic adenopathy. Since the prior CT, interval renal transplant
in the right iliac fossa.  Minimal nonspecific peritransplant edema
is identified, including on images 68 - 73/series 2.  This is of
indeterminate acuity.  No transplant hydronephrosis.

Normal urinary bladder and uterus, without adnexal mass or
significant free pelvic fluid.

Eccentric right paramidline fat containing ventral hernia.
Minimally increased in size.

Bones/Musculoskeletal:  Mild osteopenia.  Bilateral nonacute lower
rib fractures.  The left-sided fractures appear new since
04/13/2011 (six lateral and tenth posterior).
IMPRESSION: 1.  Interval right iliac fossa renal transplant.  Mild nonspecific
peritransplant edema identified.  This is of indeterminate acuity.
Considerations include transplant infection or even rejection, in
the right clinical setting.

2.  No other explanation for right-sided pain.
3.  Findings of polycystic kidney/liver disease.
4.  Normal appendix.
5.  Fat containing umbilical hernia, slightly increased.
6.  Nonacute rib fractures, including interval left-sided fractures
since 04/13/2011.

## 2014-12-24 ENCOUNTER — Other Ambulatory Visit: Payer: Self-pay | Admitting: Family Medicine

## 2014-12-24 DIAGNOSIS — R0989 Other specified symptoms and signs involving the circulatory and respiratory systems: Secondary | ICD-10-CM

## 2014-12-27 ENCOUNTER — Ambulatory Visit
Admission: RE | Admit: 2014-12-27 | Discharge: 2014-12-27 | Disposition: A | Discharge status: Home / Self Care | Payer: Medicare Other | Source: Ambulatory Visit | Attending: Family Medicine | Admitting: Family Medicine

## 2014-12-27 DIAGNOSIS — R0989 Other specified symptoms and signs involving the circulatory and respiratory systems: Secondary | ICD-10-CM

## 2015-02-07 ENCOUNTER — Other Ambulatory Visit: Payer: Self-pay | Admitting: Nephrology

## 2015-02-07 DIAGNOSIS — R109 Unspecified abdominal pain: Secondary | ICD-10-CM

## 2015-02-07 DIAGNOSIS — Q613 Polycystic kidney, unspecified: Secondary | ICD-10-CM

## 2015-02-07 DIAGNOSIS — Z940 Kidney transplant status: Secondary | ICD-10-CM

## 2015-02-11 ENCOUNTER — Ambulatory Visit
Admission: RE | Admit: 2015-02-11 | Discharge: 2015-02-11 | Disposition: A | Discharge status: Home / Self Care | Payer: Medicare Other | Source: Ambulatory Visit | Attending: Nephrology | Admitting: Nephrology

## 2015-02-11 DIAGNOSIS — Z940 Kidney transplant status: Secondary | ICD-10-CM

## 2015-02-11 DIAGNOSIS — R109 Unspecified abdominal pain: Secondary | ICD-10-CM

## 2015-02-11 DIAGNOSIS — Q613 Polycystic kidney, unspecified: Secondary | ICD-10-CM

## 2015-10-18 ENCOUNTER — Inpatient Hospital Stay: Payer: No Typology Code available for payment source | Admitting: Internal Medicine

## 2015-10-18 ENCOUNTER — Inpatient Hospital Stay
Admission: EM | Admit: 2015-10-18 | Discharge: 2015-10-21 | DRG: 194 | Disposition: A | Discharge status: Home / Self Care | Payer: No Typology Code available for payment source | Attending: Hospitalist | Admitting: Hospitalist

## 2015-10-18 DIAGNOSIS — J45901 Unspecified asthma with (acute) exacerbation: Secondary | ICD-10-CM | POA: Diagnosis present

## 2015-10-18 DIAGNOSIS — M109 Gout, unspecified: Secondary | ICD-10-CM

## 2015-10-18 DIAGNOSIS — J45909 Unspecified asthma, uncomplicated: Secondary | ICD-10-CM

## 2015-10-18 DIAGNOSIS — R0902 Hypoxemia: Secondary | ICD-10-CM | POA: Diagnosis present

## 2015-10-18 DIAGNOSIS — Z7982 Long term (current) use of aspirin: Secondary | ICD-10-CM

## 2015-10-18 DIAGNOSIS — I10 Essential (primary) hypertension: Secondary | ICD-10-CM

## 2015-10-18 DIAGNOSIS — F32A Depression, unspecified: Secondary | ICD-10-CM

## 2015-10-18 DIAGNOSIS — K219 Gastro-esophageal reflux disease without esophagitis: Secondary | ICD-10-CM

## 2015-10-18 DIAGNOSIS — Q613 Polycystic kidney, unspecified: Secondary | ICD-10-CM

## 2015-10-18 DIAGNOSIS — I129 Hypertensive chronic kidney disease with stage 1 through stage 4 chronic kidney disease, or unspecified chronic kidney disease: Secondary | ICD-10-CM | POA: Diagnosis present

## 2015-10-18 DIAGNOSIS — J189 Pneumonia, unspecified organism: Principal | ICD-10-CM | POA: Diagnosis present

## 2015-10-18 DIAGNOSIS — Z940 Kidney transplant status: Secondary | ICD-10-CM

## 2015-10-18 DIAGNOSIS — Z7952 Long term (current) use of systemic steroids: Secondary | ICD-10-CM

## 2015-10-18 DIAGNOSIS — R0602 Shortness of breath: Secondary | ICD-10-CM | POA: Diagnosis present

## 2015-10-18 DIAGNOSIS — N189 Chronic kidney disease, unspecified: Secondary | ICD-10-CM

## 2015-10-18 DIAGNOSIS — M1A9XX0 Chronic gout, unspecified, without tophus (tophi): Secondary | ICD-10-CM | POA: Diagnosis present

## 2015-10-18 NOTE — ED Notes (Signed)
C/O SOB and cough, onset 5 days ago, reports worsening since onset

## 2015-10-18 NOTE — ED Provider Notes (Signed)
Physician/Midlevel provider first contact with patient: 10/18/15 2351         EMERGENCY DEPARTMENT HISTORY AND PHYSICAL EXAM    Date: 10/18/2015  Patient Name: Judy Barrett,Judy Barrett  Attending Physician: Arlana Lindau, MD      Disposition and Treatment Plan    Clinical Impression: No diagnosis found.  Disposition:   ED Disposition     None               History of Presenting Illness     Chief Complaint:   Chief Complaint   Patient presents with   . Shortness of Breath     Judy Barrett is a 59 y.o. female who  has a past medical history of Polycystic kidney disease; Gout; Hypertension; Asthma; Hyperparathyroidism; and Osteoporosis. c/o increasing SOB for the last few days. Pt has a complex medical hx most recently treated for URI and asthma earlier this month with atb's and inhalers. Visiting form [place]. Now with increasing SOB and chest tightness. Pt is a kidney tx pt secondary to PCO.  This history was obtained from patient    PCP: Pcp, Noneorunknown, MD      Past Medical History     Past Medical History   Diagnosis Date   . Polycystic kidney disease    . Gout    . Hypertension    . Asthma    . Hyperparathyroidism    . Osteoporosis        Past Surgical History   Procedure Laterality Date   . Transplant, kidney (cadaver)     . Bilateral oophorectomy     . Av fistula placement     . Carpal tunnel release     . Cesarean section     . Tonsillectomy         Family History     No family history on file.    Social History     Social History     Social History   . Marital Status: Married     Spouse Name: N/A   . Number of Children: N/A   . Years of Education: N/A     Occupational History   . Not on file.     Social History Main Topics   . Smoking status: Former Games developer   . Smokeless tobacco: Not on file   . Alcohol Use: No   . Drug Use: No   . Sexual Activity: Not on file     Other Topics Concern   . Not on file     Social History Narrative   . No narrative on file        Allergies     Allergies   Allergen Reactions   . Infed  [Iron Dextran] Anaphylaxis   . Pentamidine Anaphylaxis   . Oxycodone Nausea And Vomiting   . Tramadol Anxiety       Medications     No current facility-administered medications for this encounter.    Current outpatient prescriptions:   .  allopurinol (ZYLOPRIM) 300 MG tablet, Take 300 mg by mouth daily., Disp: , Rfl:   .  aspirin 81 MG chewable tablet, Chew 81 mg by mouth daily., Disp: , Rfl:   .  furosemide (LASIX) 40 MG tablet, Take 40 mg by mouth daily., Disp: , Rfl:   .  magnesium oxide (MAG-OX) 400 MG tablet, Take 400 mg by mouth 2 (two) times daily., Disp: , Rfl:   .  pantoprazole (PROTONIX) 40 MG tablet, Take 40 mg by  mouth 2 (two) times daily., Disp: , Rfl:   .  predniSONE (DELTASONE) 5 MG tablet, Take by mouth 2 (two) times daily. 5 mg every morning, 2.5 mg every evening, Disp: , Rfl:   .  tacrolimus (PROGRAF) 0.5 MG capsule, Take 0.5 mg by mouth every evening., Disp: , Rfl:   .  tacrolimus (PROGRAF) 1 MG capsule, Take 1 mg by mouth every morning., Disp: , Rfl:     Review of Systems     Constitutional: Negative.  Negative for fever, chills and appetite change.   Eyes: Negative.    Gastrointestinal: Negative.  Negative for abdominal pain.   Genitourinary: Negative.  Negative for difficulty urinating.   Neurological: Negative.  Negative for headaches.   All Other Systems Reviewed and Negative: Yes    Physical Exam     Physical Exam   Nursing note and vitals reviewed.  Constitutional:Pt is oriented to person, place, and time. Pt appears well-developed and well-nourished. No distress. Elevated BMI  HEENT:   Head: Normocephalic and atraumatic.   Right Ear: External ear normal.   Left Ear: External ear normal.   Nose: Nose normal.   Mouth/Throat: Oropharynx is clear and moist. No oropharyngeal exudate.   Eyes: Conjunctivae and EOM are normal. Pupils are equal, round, and reactive to light. Right eye exhibits no discharge. Left eye exhibits no discharge. No scleral icterus.   Neck: Normal range of motion. Neck  supple. No JVD present. No tracheal deviation present. No thyromegaly present.   Cardiovascular: Normal rate, regular rhythm, normal heart sounds and intact distal pulses.  Exam reveals no gallop and no friction rub.    + 4/5 SEM   Pulmonary/Chest: Effort normal and breath sounds normal. No stridor. No respiratory distress. Pt has no wheezes. Pt has no rales. Pt exhibits no tenderness.   Abdominal: Soft. Bowel sounds are normal. Pt exhibits no distension and no mass. There is no tenderness. There is no rebound and no guarding.   Musculoskeletal:  No edema. FROM  Neurological: . Pt displays normal reflexes. No cranial nerve deficit. Pt exhibits normal muscle tone. Coordination normal.   Skin: Skin is warm and dry. Pt is not diaphoretic.   Psychiatric: Pt has a normal mood and affect. Behavior is normal. Judgment and thought content normal      Diagnostic Study Results     Labs -     Results     ** No results found for the last 24 hours. **          Radiologic Studies -   Radiology Results (24 Hour)     ** No results found for the last 24 hours. **      .    Clinical Course in the Emergency Department     EKG interpreted by me: sinus tach at 103, T wave inversions inf.     Medical Decision Making   I am the first provider for this patient.  I reviewed the vital signs, nursing notes, past medical history, past surgical history, family history and social history.  I have reviewed the patient's previous charts.    Pulse ox reviewed by me. All ordered labs and images reviewed by me.     Diagnosis and Treatment Plan       _______________________________      I am the first provider for this patient and I personally performed the services documented. . This note accurately reflects work and decisions made by me.  Arlana Lindau, MD  Arlana Lindau, MD  10/19/15 [facility]

## 2015-10-19 ENCOUNTER — Emergency Department: Payer: No Typology Code available for payment source

## 2015-10-19 ENCOUNTER — Inpatient Hospital Stay: Payer: No Typology Code available for payment source

## 2015-10-19 DIAGNOSIS — J189 Pneumonia, unspecified organism: Principal | ICD-10-CM

## 2015-10-19 DIAGNOSIS — R0902 Hypoxemia: Secondary | ICD-10-CM

## 2015-10-19 DIAGNOSIS — Z940 Kidney transplant status: Secondary | ICD-10-CM

## 2015-10-19 DIAGNOSIS — R0602 Shortness of breath: Secondary | ICD-10-CM

## 2015-10-19 DIAGNOSIS — N183 Chronic kidney disease, stage 3 (moderate): Secondary | ICD-10-CM

## 2015-10-19 LAB — ECG 12-LEAD
Atrial Rate: 103 {beats}/min
P Axis: 35 degrees
P-R Interval: 116 ms
Q-T Interval: 342 ms
QRS Duration: 78 ms
QTC Calculation (Bezet): 448 ms
R Axis: 7 degrees
T Axis: -6 degrees
Ventricular Rate: 103 {beats}/min

## 2015-10-19 LAB — COMPREHENSIVE METABOLIC PANEL
ALT: 23 U/L (ref 0–55)
AST (SGOT): 20 U/L (ref 5–34)
Albumin/Globulin Ratio: 0.9 (ref 0.9–2.2)
Albumin: 3 g/dL — ABNORMAL LOW (ref 3.5–5.0)
Alkaline Phosphatase: 105 U/L (ref 37–106)
Anion Gap: 10 (ref 5.0–15.0)
BUN: 24.7 mg/dL — ABNORMAL HIGH (ref 7.0–19.0)
Bilirubin, Total: 0.9 mg/dL (ref 0.2–1.2)
CO2: 24 mEq/L (ref 22–29)
Calcium: 9.2 mg/dL (ref 8.5–10.5)
Chloride: 107 mEq/L (ref 100–111)
Creatinine: 1.4 mg/dL — ABNORMAL HIGH (ref 0.6–1.0)
Globulin: 3.3 g/dL (ref 2.0–3.6)
Glucose: 150 mg/dL — ABNORMAL HIGH (ref 70–100)
Potassium: 4.1 mEq/L (ref 3.5–5.1)
Protein, Total: 6.3 g/dL (ref 6.0–8.3)
Sodium: 141 mEq/L (ref 136–145)

## 2015-10-19 LAB — URINALYSIS
Bilirubin, UA: NEGATIVE
Glucose, UA: NEGATIVE
Ketones UA: NEGATIVE
Nitrite, UA: NEGATIVE
Protein, UR: NEGATIVE
Specific Gravity UA: 1.015 (ref 1.001–1.035)
Urine pH: 5.5 (ref 5.0–8.0)
Urobilinogen, UA: 0.2 mg/dL (ref 0.2–2.0)

## 2015-10-19 LAB — PT/INR
PT INR: 1.1 (ref 0.9–1.1)
PT: 14.6 s (ref 12.6–15.0)

## 2015-10-19 LAB — CBC AND DIFFERENTIAL
Basophils Absolute Automated: 0.02 10*3/uL (ref 0.00–0.20)
Basophils Automated: 0 %
Eosinophils Absolute Automated: 0.1 10*3/uL (ref 0.00–0.70)
Eosinophils Automated: 1 %
Hematocrit: 42.2 % (ref 37.0–47.0)
Hgb: 12.6 g/dL (ref 12.0–16.0)
Lymphocytes Absolute Automated: 0.99 10*3/uL (ref 0.50–4.40)
Lymphocytes Automated: 10 %
MCH: 29 pg (ref 28.0–32.0)
MCHC: 29.9 g/dL — ABNORMAL LOW (ref 32.0–36.0)
MCV: 97 fL (ref 80.0–100.0)
MPV: 11.1 fL (ref 9.4–12.3)
Monocytes Absolute Automated: 0.6 10*3/uL (ref 0.00–1.20)
Monocytes: 6 %
Neutrophils Absolute: 8.57 10*3/uL — ABNORMAL HIGH (ref 1.80–8.10)
Neutrophils: 83 %
Platelets: 163 10*3/uL (ref 140–400)
RBC: 4.35 10*6/uL (ref 4.20–5.40)
RDW: 17 % — ABNORMAL HIGH (ref 12–15)
WBC: 10.28 10*3/uL (ref 3.50–10.80)

## 2015-10-19 LAB — APTT: PTT: 33 s (ref 23–37)

## 2015-10-19 LAB — I-STAT TROPONIN: i-STAT Troponin: 0 ng/mL (ref 0.00–0.09)

## 2015-10-19 LAB — URINE MICROSCOPIC

## 2015-10-19 LAB — GFR: EGFR: 38.6

## 2015-10-19 LAB — IHS D-DIMER: D-Dimer: 0.68 ug/mL FEU — ABNORMAL HIGH (ref 0.00–0.51)

## 2015-10-19 MED ORDER — NALOXONE HCL 0.4 MG/ML IJ SOLN (WRAP)
0.2000 mg | INTRAMUSCULAR | Status: DC | PRN
Start: 2015-10-19 — End: 2015-10-21

## 2015-10-19 MED ORDER — ENOXAPARIN SODIUM 100 MG/ML SC SOLN
1.0000 mg/kg | Freq: Once | SUBCUTANEOUS | Status: AC
Start: 2015-10-19 — End: 2015-10-19
  Administered 2015-10-19: 90 mg via SUBCUTANEOUS
  Filled 2015-10-19: qty 1

## 2015-10-19 MED ORDER — METHYLPREDNISOLONE SODIUM SUCC 125 MG IJ SOLR
125.0000 mg | Freq: Once | INTRAMUSCULAR | Status: AC
Start: 2015-10-19 — End: 2015-10-19
  Administered 2015-10-19: 125 mg via INTRAVENOUS
  Filled 2015-10-19: qty 2

## 2015-10-19 MED ORDER — ALBUTEROL SULFATE (2.5 MG/3ML) 0.083% IN NEBU
7.5000 mg | INHALATION_SOLUTION | Freq: Once | RESPIRATORY_TRACT | Status: AC
Start: 2015-10-19 — End: 2015-10-19
  Administered 2015-10-19: 7.5 mg via RESPIRATORY_TRACT
  Filled 2015-10-19: qty 9

## 2015-10-19 MED ORDER — MYCOPHENOLATE SODIUM 180 MG PO TBEC
180.0000 mg | DELAYED_RELEASE_TABLET | Freq: Two times a day (BID) | ORAL | Status: DC
Start: 2015-10-19 — End: 2015-10-21
  Administered 2015-10-19 – 2015-10-21 (×5): 180 mg via ORAL
  Filled 2015-10-19 (×7): qty 1

## 2015-10-19 MED ORDER — ACETAMINOPHEN 325 MG PO TABS
650.0000 mg | ORAL_TABLET | Freq: Three times a day (TID) | ORAL | Status: DC | PRN
Start: 2015-10-19 — End: 2015-10-21

## 2015-10-19 MED ORDER — LEVOFLOXACIN IN D5W 250 MG/50ML IV SOLN
250.0000 mg | INTRAVENOUS | Status: DC
Start: 2015-10-19 — End: 2015-10-21
  Administered 2015-10-19 – 2015-10-21 (×3): 250 mg via INTRAVENOUS
  Filled 2015-10-19 (×3): qty 50

## 2015-10-19 MED ORDER — ALBUTEROL-IPRATROPIUM 2.5-0.5 (3) MG/3ML IN SOLN
3.0000 mL | Freq: Four times a day (QID) | RESPIRATORY_TRACT | Status: DC
Start: 2015-10-19 — End: 2015-10-19
  Administered 2015-10-19 (×2): 3 mL via RESPIRATORY_TRACT
  Filled 2015-10-19 (×2): qty 3

## 2015-10-19 MED ORDER — ENOXAPARIN SODIUM 100 MG/ML SC SOLN
1.0000 mg/kg | Freq: Two times a day (BID) | SUBCUTANEOUS | Status: DC
Start: 2015-10-19 — End: 2015-10-19
  Administered 2015-10-19: 90 mg via SUBCUTANEOUS
  Filled 2015-10-19 (×2): qty 1

## 2015-10-19 MED ORDER — FLUOXETINE HCL 20 MG PO CAPS
20.0000 mg | ORAL_CAPSULE | Freq: Every day | ORAL | Status: DC
Start: 2015-10-19 — End: 2015-10-21
  Administered 2015-10-19 – 2015-10-21 (×3): 20 mg via ORAL
  Filled 2015-10-19 (×3): qty 1

## 2015-10-19 MED ORDER — TECHNETIUM TC 99M PENTETATE AEROSOL
28.6000 | Freq: Once | Status: AC | PRN
Start: 2015-10-19 — End: 2015-10-19
  Administered 2015-10-19: 29 via RESPIRATORY_TRACT
  Filled 2015-10-19: qty 75

## 2015-10-19 MED ORDER — TACROLIMUS 1 MG PO CAPS
1.0000 mg | ORAL_CAPSULE | Freq: Every day | ORAL | Status: DC
Start: 2015-10-19 — End: 2015-10-21
  Administered 2015-10-19 – 2015-10-21 (×3): 1 mg via ORAL
  Filled 2015-10-19 (×5): qty 1

## 2015-10-19 MED ORDER — ACETAMINOPHEN 325 MG PO TABS
650.0000 mg | ORAL_TABLET | ORAL | Status: DC | PRN
Start: 2015-10-19 — End: 2015-10-19

## 2015-10-19 MED ORDER — ASPIRIN 81 MG PO CHEW
81.0000 mg | CHEWABLE_TABLET | Freq: Every day | ORAL | Status: DC
Start: 2015-10-19 — End: 2015-10-21
  Administered 2015-10-19 – 2015-10-21 (×3): 81 mg via ORAL
  Filled 2015-10-19 (×3): qty 1

## 2015-10-19 MED ORDER — PREDNISONE 20 MG PO TABS
40.0000 mg | ORAL_TABLET | Freq: Every morning | ORAL | Status: DC
Start: 2015-10-19 — End: 2015-10-20
  Administered 2015-10-19 – 2015-10-20 (×2): 40 mg via ORAL
  Filled 2015-10-19 (×2): qty 2

## 2015-10-19 MED ORDER — FLUTICASONE PROPIONATE 50 MCG/ACT NA SUSP
1.0000 | Freq: Two times a day (BID) | NASAL | Status: DC
Start: 2015-10-19 — End: 2015-10-21
  Administered 2015-10-19 – 2015-10-21 (×5): 1 via NASAL
  Filled 2015-10-19: qty 16

## 2015-10-19 MED ORDER — TACROLIMUS 0.5 MG PO CAPS
0.5000 mg | ORAL_CAPSULE | Freq: Every day | ORAL | Status: DC
Start: 2015-10-19 — End: 2015-10-21
  Administered 2015-10-19 – 2015-10-20 (×2): 0.5 mg via ORAL
  Filled 2015-10-19 (×3): qty 1

## 2015-10-19 MED ORDER — TECHNETIUM TC 99M ALBUMIN AGGREGATED
5.7400 | Freq: Once | Status: AC | PRN
Start: 2015-10-19 — End: 2015-10-19
  Administered 2015-10-19: 6 via INTRAVENOUS
  Filled 2015-10-19: qty 10

## 2015-10-19 MED ORDER — FLUTICASONE FUROATE-VILANTEROL 100-25 MCG/INH IN AEPB
1.0000 | INHALATION_SPRAY | Freq: Every morning | RESPIRATORY_TRACT | Status: DC
Start: 2015-10-19 — End: 2015-10-21
  Administered 2015-10-20 – 2015-10-21 (×2): 1 via RESPIRATORY_TRACT
  Filled 2015-10-19: qty 28

## 2015-10-19 MED ORDER — ALBUTEROL-IPRATROPIUM 2.5-0.5 (3) MG/3ML IN SOLN
3.0000 mL | Freq: Four times a day (QID) | RESPIRATORY_TRACT | Status: DC | PRN
Start: 2015-10-19 — End: 2015-10-20
  Administered 2015-10-20: 3 mL via RESPIRATORY_TRACT
  Filled 2015-10-19: qty 3

## 2015-10-19 MED ORDER — PANTOPRAZOLE SODIUM 40 MG PO TBEC
40.0000 mg | DELAYED_RELEASE_TABLET | Freq: Every day | ORAL | Status: DC
Start: 2015-10-19 — End: 2015-10-21
  Administered 2015-10-19 – 2015-10-21 (×3): 40 mg via ORAL
  Filled 2015-10-19 (×3): qty 1

## 2015-10-19 MED ORDER — FUROSEMIDE 40 MG PO TABS
40.0000 mg | ORAL_TABLET | Freq: Every day | ORAL | Status: DC
Start: 2015-10-19 — End: 2015-10-21
  Administered 2015-10-19 – 2015-10-21 (×3): 40 mg via ORAL
  Filled 2015-10-19 (×3): qty 1

## 2015-10-19 MED ORDER — HEPARIN SODIUM (PORCINE) 5000 UNIT/ML IJ SOLN
5000.0000 [IU] | Freq: Three times a day (TID) | INTRAMUSCULAR | Status: DC
Start: 2015-10-19 — End: 2015-10-21
  Administered 2015-10-19 – 2015-10-21 (×5): 5000 [IU] via SUBCUTANEOUS
  Filled 2015-10-19 (×5): qty 1

## 2015-10-19 MED ORDER — ALLOPURINOL 300 MG PO TABS
300.0000 mg | ORAL_TABLET | Freq: Every day | ORAL | Status: DC
Start: 2015-10-19 — End: 2015-10-21
  Administered 2015-10-19 – 2015-10-21 (×3): 300 mg via ORAL
  Filled 2015-10-19 (×3): qty 1

## 2015-10-19 NOTE — Progress Notes (Signed)
Pt is off unit via transport stretcher to [facility] for VQ scan

## 2015-10-19 NOTE — Progress Notes (Signed)
10/19/15 1948   Patient Assessment   Status Completed   RT Priority 3   Subjective Breathing is good    Pulmonary History Asthma   Airway type Other (Comment)   Surgical Status None   Mobility Ambulatory with or without assistance   CXR Bibasler atelectasis, low lung volumes   Cough Spontaneous;Productive   Heart Rate 86   Resp Rate 18   Bilateral Breath Sounds Clear   Location Specific No   Home regimen   Home Treatments Y   Home Oxygen N   Home CPAP/BiLevel N   Oxygen Therapy   SpO2 92 %   O2 Device None (Room air)   Criteria for Therapy   Secretion Clearance (BPH) None   Lung Expansion None   Respiratory Medications Patient receives maintenance therapy at home   Oxygen None   Severity Index   Severity Index NONE   Outcomes   Respiratory medication Decreased wheezing, air movement or symptoms   Recommendations   Respiratory Medications Change to as needed   Performing Departments   O2 Device performing department formula [id number]   Resp assess adult performing department formula [id number]   Nebs given performing department formula [id number]   Duonebs changed to Q6 PRN per RT protocol.    Laurence Ferrari RRT-NPS  [phone]

## 2015-10-19 NOTE — ED Notes (Signed)
O2 decreased to 3L via [place]. Patient states she's feeling better.

## 2015-10-19 NOTE — Plan of Care (Signed)
Pt is stable; POC: off oxygen [place] and into RA; pt started incentive spirometer baseline of 1000 mL; pt will receive a VQ scan and possibly BLE doppler to rule out any clots; pt received 1st dose of IV abx; possibly [place] Monday to home

## 2015-10-19 NOTE — H&P (Signed)
ADMISSION HISTORY AND PHYSICAL EXAM    Date Time: 10/19/2015 3:25 AM  Patient Name: Judy Barrett,Judy Barrett  Attending Physician: Arlana Lindau, MD  Primary Care Physician: Christa See, MD    CC: chest tightness    Assessment:   59 y.o. female with history of polycytic kidney disease s/p transplant on chronic steroids, asthma, HTN, who presents with chest tightness found to have hypoxia concerning for pneumonia vs PE.     Plan:     #Hypoxia   CXR with R opacity and pt reporting productive cough with greenish sputum concerning for pneumonia.  PE cannot be ruled out, with recent travel and modified wells = 3.  Asthma exacerbation is also on the ddx given improvement with solumedrol and nebs.    Currently on 3L [place] (from none at baseline), but pt reports dyspnea and chest tightness is much improved.    - Check V/Q scan (CTA contraindicated given Cr 1.4.  Pt thinks baseline Cr is ~ 1.3)  - s/p lovenox at OSH.  Continue full dose lovenox for now and plan to discontinue if V/Q scan is negative  - Duo-nebs Q6h ATC and PRN  - s/p solumedrol IV.  Start PO prednisone 40mg  daily  - start IV levofloxacin for pneumonia  - Check sputum culture  - Check resp viral panel  - Check rapid flu  - check procalcitonin  - Continue home lasix 40mg  daily     #history of asthma - Substitute Breo for home symbicort    #CKD - Cr 1.4 on admission.  Pt reports baseline Cr is ~1.4. Continue to monitor and dose meds renally.    #history of PCKD s/p transplant   - Prednisone as above  - Continue home prograft 1mg  QAM 0.5mg  QHS.  Check prograft level.    - Continue home mycophenolate 180mg  BID    #History of HTN - continue home lasix 40mg  daily     #History of gout - home allopurinol     #hisotry of mood disorder - continue home fluoxetine     FEN: renal diet  Ppx: Lovenox for DVT  Code: FULL       Disposition:     Today's date: 10/19/2015  Admit Date: 10/18/2015 11:37 PM  Service status: Inpatient: risk of morbidity and mortality  Reason for  ongoing hospitalization: new hypoxia  Anticipated discharge needs: TBD    History of Presenting Illness:   Judy Barrett is a 59 y.o. female with history of polycytic kidney disease s/p transplant on chronic steroids, asthma, HTN, who presents with chest tightness.     Judy Barrett reports being in usual state of health until about 2 days ago when she developed productive cough with green sputum and dyspnea. Dyspnea got progressively worse and yesterday she also had chest tightness and severe dyspnea which did not respond to her rescue inhalor so she decided to go to the [facility] urgent care for evaluation.  She reports that she had an episode of bronchitis/sinusitis back in Nov and completed a course of antibiotics at that time, but this episode was much worse.      Of note, pt usually lives in [place] and drove here 2 days ago (5 hour drive). No sick contacts. She denies fevers, chills, back apin, N/V/D, dysuria, weight loss.     In the urgent care, she was noted to be hypoxic with SpO2 89% on room air, requiring 3L [place] to maintain sats.  Labs were notable for D dimer 0.68 and Cr 1.4.  She received IV solumedrol 125mg  x1, full dose lovenox x1, and albuterol neb.  She reports that she now feels a lot better.      Past Medical History:     Past Medical History   Diagnosis Date   . Polycystic kidney disease    . Gout    . Hypertension    . Asthma    . Hyperparathyroidism    . Osteoporosis    . Gastroesophageal reflux disease    . Depression          Past Surgical History:     Past Surgical History   Procedure Laterality Date   . Transplant, kidney (cadaver)     . Bilateral oophorectomy     . Av fistula placement     . Carpal tunnel release     . Cesarean section     . Tonsillectomy           Medications:     Current/Home Medications    ACETAMINOPHEN (TYLENOL) 325 MG TABLET    Take 650 mg by mouth as needed for Pain.    ALBUTEROL (PROVENTIL HFA;VENTOLIN HFA) 108 (90 BASE) MCG/ACT INHALER    Inhale 2 puffs into the lungs every  4 (four) hours as needed.    ALLOPURINOL (ZYLOPRIM) 300 MG TABLET    Take 300 mg by mouth daily.    ASPIRIN 81 MG CHEWABLE TABLET    Chew 81 mg by mouth daily.    BUDESONIDE-FORMOTEROL FUMARATE (SYMBICORT IN)    Inhale into the lungs 2 (two) times daily.    CYCLOSPORINE (RESTASIS) 0.05 % OPHTHALMIC EMULSION    Place 1 drop into both eyes 2 (two) times daily.    FLUOXETINE (PROZAC) 20 MG CAPSULE    Take 20 mg by mouth daily.    FLUTICASONE (FLONASE) 50 MCG/ACT NASAL SPRAY    1 spray by Nasal route 2 (two) times daily.    FUROSEMIDE (LASIX) 40 MG TABLET    Take 40 mg by mouth daily.    MAGNESIUM OXIDE (MAG-OX) 400 MG TABLET    Take 400 mg by mouth 2 (two) times daily.    MYCOPHENOLATE (MYFORTIC) 180 MG EC TABLET    Take by mouth 2 (two) times daily.    PANTOPRAZOLE (PROTONIX) 40 MG TABLET    Take 40 mg by mouth 2 (two) times daily.    PREDNISONE (DELTASONE) 5 MG TABLET    Take by mouth 2 (two) times daily. 5 mg every morning, 2.5 mg every evening    TACROLIMUS (PROGRAF) 0.5 MG CAPSULE    Take 0.5 mg by mouth every evening.    TACROLIMUS (PROGRAF) 1 MG CAPSULE    Take 1 mg by mouth every morning.    TRAVOPROST, BENZALKONIUM, (TRAVATAN) 0.004 % OPHTHALMIC SOLUTION    Place 1 drop into both eyes nightly.    VITAMIN B-12 (CYANOCOBALAMIN) 1000 MCG TABLET    Take 1,000 mcg by mouth daily.       Method by which medications were confirmed on admission: discussed with pt     Allergies:     Allergies   Allergen Reactions   . Infed [Iron Dextran] Anaphylaxis   . Pentamidine Anaphylaxis   . Oxycodone Nausea And Vomiting   . Tramadol Anxiety         Family History:   Father had PCKD and CV    Social History:   Former smoker, quit 26 year ago.  Rare EtOH.    Independent of ADLs. Visitng  from [place]    Review of Systems:   All other systems were reviewed and are negative except: as described in HPI    Physical Exam:   Patient Vitals for the past 24 hrs:   BP Temp Temp src Pulse Resp SpO2 Height Weight   10/19/15 0300 116/56 mmHg - - 96  19 97 % - -   10/19/15 0230 127/60 mmHg - - 97 19 95 % - -   10/19/15 0224 127/60 mmHg - - (!) 101 21 95 % - -   10/19/15 0200 126/59 mmHg - - (!) 103 (!) 30 96 % - -   10/19/15 0100 122/63 mmHg - - (!) 108 (!) 25 93 % - -   10/19/15 0030 142/74 mmHg - - 96 19 94 % - -   10/19/15 0013 - - - (!) 104 22 95 % - -   10/19/15 0008 134/64 mmHg - - (!) 104 (!) 23 93 % - -   10/18/15 2350 - - - - - (!) 89 % - -   10/18/15 2339 164/70 mmHg 99.2 F (37.3 C) Oral (!) 112 18 91 % 1.549 m (5\' 1" ) 88.905 kg (196 lb)     Body mass index is 37.05 kg/(m^2).  No intake or output data in the 24 hours ending 10/19/15 0325    Gen: lying comfortably in bed, no acute distress  HEENT: anicteric, moist mucus membranes, no oral lesions  NECK: supple, no JVD  CV: RRR, + systolic murmur loudest at RSB  PULM: ctab, no wheezes/crackles  ABDO: soft, non-tender, non-distended  EXTREMITIES: no lower/upper extremity edema  NEURO: alert and oriented, able to follow simple commands      Labs:     Results     Procedure Component Value Units Date/Time    Microscopic, Urine [[id number]] Collected:  10/19/15 0310     RBC, UA 0 - 5 /hpf Updated:  10/19/15 0323     WBC, UA 0 - 5 /hpf      Squamous Epithelial Cells, Urine 0 - 5 /hpf     UA with reflex to Micro [[id number]]  (Abnormal) Collected:  10/19/15 0310    Specimen Information:  Urine Updated:  10/19/15 0323     Urine Type Clean Catch      Color, UA Yellow      Clarity, UA Clear      Specific Gravity UA 1.015      Urine pH 5.5      Leukocyte Esterase, UA Trace (A)      Nitrite, UA Negative      Protein, UR Negative      Glucose, UA Negative      Ketones UA Negative      Urobilinogen, UA 0.2 mg/dL      Bilirubin, UA Negative      Blood, UA Trace (A)     APTT [[id number]] Collected:  10/19/15 0015     PTT 33 sec Updated:  10/19/15 0126    Comprehensive metabolic panel [[id number]]  (Abnormal) Collected:  10/19/15 0015    Specimen Information:  Blood Updated:  10/19/15 0047     Glucose 150 (H) mg/dL       BUN 30.1 (H) mg/dL      Creatinine 1.4 (H) mg/dL      Sodium 601 mEq/L      Potassium 4.1 mEq/L      Chloride 107 mEq/L      CO2 24 mEq/L  Calcium 9.2 mg/dL      Protein, Total 6.3 g/dL      Albumin 3.0 (L) g/dL      AST (SGOT) 20 U/L      ALT 23 U/L      Alkaline Phosphatase 105 U/L      Bilirubin, Total 0.9 mg/dL      Globulin 3.3 g/dL      Albumin/Globulin Ratio 0.9      Anion Gap 10.0     GFR [[id number]] Collected:  10/19/15 0015     EGFR 38.6 Updated:  10/19/15 0047    Prothrombin time/INR [[id number]]  (Abnormal) Collected:  10/19/15 0015    Specimen Information:  Blood Updated:  10/19/15 0040     PT 14.6 sec      PT INR 1.1      PT Anticoag. Given Within 48 hrs. None (A)     D-Dimer [[id number]]  (Abnormal) Collected:  10/19/15 0015     D-Dimer 0.68 (H) ug/mL FEU Updated:  10/19/15 0040    i-Stat Troponin [[id number]] Collected:  10/19/15 0015     i-STAT Troponin 0.00 ng/mL Updated:  10/19/15 0034    CBC with differential [[id number]]  (Abnormal) Collected:  10/19/15 0015    Specimen Information:  Blood from Blood Updated:  10/19/15 0025     WBC 10.28 x10 3/uL      Hgb 12.6 g/dL      Hematocrit 25.9 %      Platelets 163 x10 3/uL      RBC 4.35 x10 6/uL      MCV 97.0 fL      MCH 29.0 pg      MCHC 29.9 (L) g/dL      RDW 17 (H) %      MPV 11.1 fL      Neutrophils 83 %      Lymphocytes Automated 10 %      Monocytes 6 %      Eosinophils Automated 1 %      Basophils Automated 0 %      Neutrophils Absolute 8.57 (H) x10 3/uL      Abs Lymph Automated 0.99 x10 3/uL      Abs Mono Automated 0.60 x10 3/uL      Abs Eos Automated 0.10 x10 3/uL      Absolute Baso Automated 0.02 x10 3/uL           Imaging personally reviewed, including: Xr Chest  Ap Portable    10/19/2015    1.  Low lung volumes. Right basilar opacity could represent atelectasis or developing infiltrate. 2.  Possible small right pleural effusion. 3.  Left basilar atelectasis. Demetrios Isaacs, MD 10/19/2015 3:01 AM       Safety Checklist  DVT prophylaxis:  CHEST  guideline (See page [id number]) Chemical and Mechanical   Foley:  [facility] Rn Foley protocol Not present   IVs:  Peripheral IV   PT/OT: Ordered   Daily CBC & or Chem ordered:  SHM/ABIM guidelines (see #5) Yes, due to clinical and lab instability   Reference for approximate charges of common labs: CBC auto diff - $76  BMP - $99  Mg - $79    Signed by: Drenda Freeze, Internal Medicine PGY2 Pager [phone]  cc:Pcp, Noneorunknown, MD

## 2015-10-19 NOTE — Progress Notes (Signed)
IM Attending Addendum:  I have seen and examined patient personally. I have reviewed and agree with findings and plan as per Dr. Daphine Deutscher with following caveats:    Pt states feels much better already and prod cough improving, breathing easier--she feels nebs helped as well.    Impression:  # Acute hypoxic respiratory insufficiency, suspect this may be due to pneumonia and reactive airway disease/asthma exacerbation. Given recent travel and elevated d-dimer will also need to r/o pulmonary embolism  # Probable right lower lobe pneumonia. Patient is at some risk for resistant organisms given her recent oral abx use and chronic steroid/immunosuppressants, though clinically looks well and responded well to steroids/nebs  # Polycystic kidney disease s/p renal transplant in 2013  # Chronic kidney disease, Cr appears about baseline (~1.3 per pt)  # Essential HTN, currently normotensive  # Chronic gout    Plan:  -tele  -wean o2 as able; ISS  -received a dose of therapeutic lovenox at 01:42; will start dvt proph dose heparin sq   -changed order for VQ to stat; decide on need LE dopplers (no evid of dvt on exam)  -will start levaquin for possible RLL pna--f/u procalcitonin, MRSA swab, legionella/strep antigens  -cont increased steroid dose for bronchospasm/asthma: prednisone 40mg  daily and albuterol scheduled and prn; seems to be helping  -consider pulm c/s if no continued improvment  -check prograf level, continue myfortic and prograf home doses    FOLEY: none  DVT prophylaxis: sq heparin    Disposition:   Today's date: 10/19/2015  Admit Date: 10/18/2015 11:37 PM  Anticipated medical stability for discharge:Red - not tomorrow - estimated month/date: 11/28  Service status: Inpatient: risk of morbidity and mortality  Clinical Milestones: improved hypoxia, PE ruled out  Anticipated discharge needs: none    Khrystina Bonnes Maretta Bees, MD

## 2015-10-19 NOTE — UM Notes (Addendum)
Guideline: Pulmonary Disease, GRG  Care Day 1:  Care Date 10/19/15  Level of Care: Inpatient Floor, Medicine Unit     Admit to inpatient on 10/19/15 @0121      History of Presenting Illness: 59 y.o. female with history of polycytic kidney disease s/p transplant on chronic steroids, asthma, HTN, who presents with chest tightness found to have hypoxia concerning for pneumonia vs PE.     ED Care: albuterol 7.5mg  neb, solu-medrol 125mg  IV, lovenox 90mg  sq    EKG: sinus tachycardia    VS: T99.2, P112, O2 89%, R30, BP 164/70    Labs: D-dimer 0.68, glucose 150, BUN 24.7, Cr 1.4, Alb 3    Imaging:  CXR   1. Low lung volumes. Right basilar opacity could represent atelectasis or developing infiltrate.  2. Possible small right pleural effusion.  3. Left basilar atelectasis.    Assessment:   1. Acute hypoxic respiratory insufficiency, suspect this may be due to pneumonia and reactive airway disease/asthma exacerbation. Given recent travel and elevated d-dimer will also need to r/o pulmonary embolism  2. Probable right lower lobe pneumonia. Patient is at some risk for resistant organisms given her recent oral abx use and chronic steroid/immunosuppressants, though clinically looks well and responded well to steroids/nebs  3. Polycystic kidney disease s/p renal transplant in 2013  4. Chronic kidney disease, Cr appears about baseline  5. Essential HTN, currently normotensive  6. Chronic gout    Plan of Care/MEDS:  - medicine/tele inpatient admission  - received a dose of therapeutic lovenox at 01:42  - will check VQ scan and continue therapeutic lovenox while results are pending  - will start levaquin for possible RLL pna  - will check procalcitonin, MRSA swab, legionella/strep antigens  - will give increased steroid dose for bronchospasm/asthma: prednisone 40mg  daily  - albuterol scheduled and prn  - if no improvement in next 24-48h, can consider pulm c/s  - check prograf level, continue myfortic and prograf home  doses    Dispo: Anticipated medical stability for discharge:Red - not tomorrow - estimated month/date: 11/28  Service status: Inpatient: risk of morbidity and mortality  Clinical Milestones: improved hypoxia, PE ruled out  Anticipated discharge needs: none    Jiles Prows, RN, BSN, MHA, MBA  Utilization Review Nurse  [phone] (voicemail)  [phone] (phone)

## 2015-10-19 NOTE — ED Notes (Signed)
To [facility] via PTS.

## 2015-10-19 NOTE — Progress Notes (Signed)
Attending Attestation:     I have seen and personally examined the patient.  I agree with the findings and exam as documented by Dr. Tedra Coupe with following caveats: Patient is a 23F with polycystic kidney disease s/p renal transplant in 05/2102 ([facility]), chronic kidney disease (baseline Cr per patient about 1.3), asthma and HTN, presenting with productive cough, chest tightness and shortness of breath. Patient states that in early November she developed pleuritic chest discomfort and wheezing. She saw her PCP, was dx with bronchitis/sinusitis and asthma flare. She was given ?omnicef and nebs. She states that her sx improved, but 2d after completing the abx her cough returned, now much more productive. She drove from [place] 2d PTA and yesterday became much more short of breath so came to the ED. Has not noted any leg swelling/pain, no diarrhea, does not have any known cardiac history. She did get her flu vaccine this year, no recent hospitalizations. She has been on chronic/stable doses of her prednisone/myfortic/prograf. In the ED she had Tm 99.2, sinus tachycardia 112 and sats 89% on RA--> improved to mid 90s with nebs and 3L O2. Patient states that she feels much better on oxygen and s/p nebs. On exam, no accessory mm use, resting comfortably speaking in full sentences. Scant crackles R base otherwise clear lungs, rrr no m/g/r, no abd tenderness, no calf tenderness or LEE. Labs notbale for WBC 10K, Cr 1.4, d dimer 0.68. CXR with possible RLL infiltrate/effusion.     Assessment and Plan:  1. Acute hypoxic respiratory insufficiency, suspect this may be due to pneumonia and reactive airway disease/asthma exacerbation. Given recent travel and elevated d-dimer will also need to r/o pulmonary embolism  2. Probable right lower lobe pneumonia. Patient is at some risk for resistant organisms given her recent oral abx use and chronic steroid/immunosuppressants, though clinically looks well and responded well to  steroids/nebs  3. Polycystic kidney disease s/p renal transplant in 2013  4. Chronic kidney disease, Cr appears about baseline  5. Essential HTN, currently normotensive  6. Chronic gout    - medicine/tele inpatient admission  - received a dose of therapeutic lovenox at 01:42  - will check VQ scan and continue therapeutic lovenox while results are pending  - will start levaquin for possible RLL pna  - will check procalcitonin, MRSA swab, legionella/strep antigens  - will give increased steroid dose for bronchospasm/asthma: prednisone 40mg  daily  - albuterol scheduled and prn  - if no improvement in next 24-48h, can consider pulm c/s  - check prograf level, continue myfortic and prograf home doses  - full code      Disposition:     Today's date: 10/19/2015  Admit Date: 10/18/2015 11:37 PM  Anticipated medical stability for discharge:Red - not tomorrow - estimated month/date: 11/28  Service status: Inpatient: risk of morbidity and mortality  Clinical Milestones: improved hypoxia, PE ruled out  Anticipated discharge needs: none    Rondel Jumbo, MD

## 2015-10-19 NOTE — Plan of Care (Signed)
Problem: Inadequate breathing pattern (Asthma)  Goal: Effective ventilation maintained  Outcome: Progressing  Pt will be monitored for O2 sat while on RA; pt will adjust well on RA and not need oxygen maintenance

## 2015-10-19 NOTE — Plan of Care (Signed)
Problem: Inadequate breathing pattern (Asthma)  Goal: Effective ventilation maintained  Intervention: Provide mechanical and oxygen support to facilitate gas exchange.  Pt arrived to floor, alert and oriented x4.   On 3L [place], productive cough and sob with exertion.   Pt denies pain. MD notified of pt's arrival, orders pending MD assessment.

## 2015-10-19 NOTE — Progress Notes (Signed)
Pt returned back to the floor from VQ procedure;stable

## 2015-10-19 NOTE — Plan of Care (Signed)
Paged Dr. Silvestre Gunner r/t pt's VQ scan which need the order to be changed to STAT in order for pt to be seen today; awaiting for feedback or order

## 2015-10-20 DIAGNOSIS — N182 Chronic kidney disease, stage 2 (mild): Secondary | ICD-10-CM

## 2015-10-20 DIAGNOSIS — J45901 Unspecified asthma with (acute) exacerbation: Secondary | ICD-10-CM

## 2015-10-20 LAB — BASIC METABOLIC PANEL
BUN: 39 mg/dL — ABNORMAL HIGH (ref 7.0–19.0)
CO2: 23 mEq/L (ref 22–29)
Calcium: 10.3 mg/dL (ref 8.5–10.5)
Chloride: 106 mEq/L (ref 100–111)
Creatinine: 1.3 mg/dL — ABNORMAL HIGH (ref 0.6–1.0)
Glucose: 152 mg/dL — ABNORMAL HIGH (ref 70–100)
Potassium: 4.6 mEq/L (ref 3.5–5.1)
Sodium: 140 mEq/L (ref 136–145)

## 2015-10-20 LAB — GFR: EGFR: 42

## 2015-10-20 LAB — PROCALCITONIN: Procalcitonin: 0.1 (ref 0.0–0.1)

## 2015-10-20 MED ORDER — ALBUTEROL-IPRATROPIUM 2.5-0.5 (3) MG/3ML IN SOLN
3.0000 mL | Freq: Four times a day (QID) | RESPIRATORY_TRACT | Status: DC
Start: 2015-10-20 — End: 2015-10-21
  Administered 2015-10-20 – 2015-10-21 (×2): 3 mL via RESPIRATORY_TRACT
  Filled 2015-10-20 (×3): qty 3

## 2015-10-20 MED ORDER — PREDNISONE 20 MG PO TABS
20.0000 mg | ORAL_TABLET | Freq: Every morning | ORAL | Status: DC
Start: 2015-10-21 — End: 2015-10-21
  Administered 2015-10-21: 20 mg via ORAL
  Filled 2015-10-20: qty 1

## 2015-10-20 NOTE — Plan of Care (Signed)
Problem: Health Promotion  Goal: Knowledge - disease process  Extent of understanding conveyed about a specific disease process.   Outcome: Progressing  Discussed today's plan. Receiving scheduled antibiotic. Anticipated discharge for tomorrow.     Problem: Inadequate Gas Exchange  Goal: Adequately oxygenating and ventilation is improved  Outcome: Progressing  PSAT 97% on RA. Using incentive spirometer. Ambulating. Receiving scheduled respiratory treatment.

## 2015-10-20 NOTE — Plan of Care (Signed)
Problem: Inadequate breathing pattern (Asthma)  Goal: Effective ventilation maintained  Outcome: Progressing  Denies any SOB or chest tightness. On RA. Encouraged to cough and deep breath and use of IS while awake. HOB elevated. Denies any pain. Instructed to use call bell for assistance.

## 2015-10-20 NOTE — Progress Notes (Addendum)
Medicine Progress Note    Date Time: 10/20/2015 8:24 AM  Patient Name: Judy Barrett,Judy Barrett      Assessment & Plan    Assessment:   Hospital Course:  Patient is a 59 yo female visiting from [place] with history of polycytic kidney disease s/p transplant on chronic steroids, asthma, HTN, who presents with SOB, chest tightness and found to be hypoxic. She had CXR showing probable right lower lobe pneumonia and associated asthma exacerbation and had excellent response so far to empiric levaquin, increased steroids and nebs. VQ scan was done and showed no evidence of PE and she was able to weaned to RA. If she continues to improve, plan is to discharge her home tomorrow with course of antibiotics and rapid taper of steroids back to her baseline dosing + MDI bronchodilators. Will need to f/u sputum cultures and flu ag.    # Acute hypoxic respiratory insufficiency, suspect this may be due to pneumonia and reactive airway disease/asthma exacerbation--good improvement so far, now on RA  -V/Q scan normal, no evid of PE  # Probable right lower lobe pneumonia. Patient is at some risk for resistant organisms given her recent oral abx use and chronic steroid/immunosuppressants, though clinically looks well and responded well to steroids/nebs  # Polycystic kidney disease s/p renal transplant in 2013  # Chronic kidney disease, Cr appears about baseline (~1.3 per pt)  # Essential HTN, currently normotensive  # Chronic gout    Plan:   -ISS, incr activity  -cont empiric LQ and f/u sputum cx and flu ag  -start taper steroids toward baseline dose (5mg  am/2.5mg  pm)  -breo, prn nebs  -check prograf level, continue myfortic and prograf home doses    Foley: none  DVT proph: sq heparin    Disposition:   Today's date: 10/20/2015  Anticipated medical stability for discharge: 11/28 AM  Clinical Milestones:   Improved cough/wheezing/sob-esp as needs to travel back home to [place]  F/u sputum cx and flu ag  Anticipated discharge needs: return home to  [facility] and close f/u with PMD there      Signed by: Milana Salay Ashiq Ejay Lashley, MD            CC: sob/cough    Subjective:   Pt feels sob much improved and weaned to RA o/n--states having increased prod cough but wheezing better and no chest tightness      Medications     Current Facility-Administered Medications   Medication Dose Route Frequency   . allopurinol  300 mg Oral Daily   . aspirin  81 mg Oral Daily   . FLUoxetine  20 mg Oral Daily   . fluticasone  1 spray Each Nare BID   . fluticasone furoate-vilanterol  1 puff Inhalation QAM   . furosemide  40 mg Oral Daily   . heparin (porcine)  5,000 Units Subcutaneous Q8H SCH   . levofloxacin  250 mg Intravenous Q24H SCH   . mycophenolate  180 mg Oral Q12H SCH   . pantoprazole  40 mg Oral Daily   . predniSONE  40 mg Oral QAM W/BREAKFAST   . tacrolimus  0.5 mg Oral Daily at 1800   . tacrolimus  1 mg Oral Daily at 0600          Physical Exam   Patient Vitals for the past 24 hrs:   BP Temp Temp src Pulse Resp SpO2 Weight   10/20/15 0757 117/56 mmHg (!) 95.9 F (35.5 C) Axillary 62 18 97 % -  10/20/15 0613 - - - - - - 89.472 kg (197 lb 4 oz)   10/19/15 1948 - - - 86 18 92 % -   10/19/15 1939 147/74 mmHg 97.1 F (36.2 C) Oral 86 18 90 % -   10/19/15 1710 - - - - - 98 % -   10/19/15 1700 - - - - - 98 % -   10/19/15 0900 135/65 mmHg (!) 96 F (35.6 C) Axillary 85 18 97 % -     Body mass index is 37.29 kg/(m^2).  Intake and Output Summary (Last 24 hours) at Date Time  No intake or output data in the 24 hours ending 10/20/15 0824    General: awake, alert, oriented x 3; no acute distress.  Cardiovascular: regular rate and rhythm, no murmurs/rubs/gallops  Lungs: clear to auscultation bilaterally anteriorly, mild end expir wheezing and scat rhonchi diffusely  Abdomen: soft, non-tender, non-distended; normoactive bowel sounds, no rebound or guarding  Extremities: no clubbing/cyanosis/edema, neg homan's sign    Labs & Imaging     Results     Procedure Component Value Units Date/Time     Basic Metabolic Panel [[id number]]  (Abnormal) Collected:  10/20/15 0442    Specimen Information:  Blood Updated:  10/20/15 0641     Glucose 152 (H) mg/dL      BUN 78.2 (H) mg/dL      Creatinine 1.3 (H) mg/dL      Calcium 95.6 mg/dL      Sodium 213 mEq/L      Potassium 4.6 mEq/L      Chloride 106 mEq/L      CO2 23 mEq/L     GFR [[id number]] Collected:  10/20/15 0442     EGFR 42.0 Updated:  10/20/15 0641    Procalcitonin [[id number]] Collected:  10/20/15 0442     Updated:  10/20/15 0634          Imaging personally reviewed, including:  Radiology Results (24 Hour)     Procedure Component Value Units Date/Time    NM Pulmonary Ventilation And Perfusion [employer] Gas) [[id number]] Collected:  10/19/15 1721    Order Status:  Completed Updated:  10/19/15 1727    Narrative:      Question pulmonary embolism short of breath. D-dimer 0.68. Chronic renal  disease    Findings no prior VQ scans.   A ventilation scan was performed with 35 mCi Tc 7m DTPA  (diethylenetriaminepentaacetic acid) aerosol and a perfusion scan was  performed with 6 mCi Tc 65m MAA (macroaggregated albumin) injected  intravenously.  Multiple matched ventilation/perfusion images of the  lungs were obtained in different projections.  There is normal  homogeneous distribution of the radiopharmaceutical throughout both  lungs, and no focal unmatched segmental perfusion defects to suggest  pulmonary embolism.      Impression:        Normal ventilation/perfusion scan.        Marty Heck, MD   10/19/2015 5:22 PM                 Signed by: Ferguson Gertner Maretta Bees, MD

## 2015-10-20 NOTE — Progress Notes (Signed)
10/20/15 1300   Patient Assessment   Status Completed   RT Priority 3   Subjective feeling tight   Pulmonary History Asthma   Airway type Other (Comment)   Surgical Status None   Mobility Ambulatory with or without assistance   Cough Spontaneous   Heart Rate 62   Resp Rate 18   Bilateral Breath Sounds Diminished   R Breath Sounds Diminished   L Breath Sounds Diminished   Location Specific No   Cough Description   Sputum Amount Small   Sputum Color UTA   Sputum Consistency UTA   Sputum How Obtained Spontaneous cough   Home regimen   Home Treatments y   Home Oxygen n   Home CPAP/BiLevel n   Oxygen Therapy   O2 Device None (Room air)   Incentive Spirometry   Baseline Spirometry Level (mL) (n/a)   Criteria for Therapy   Secretion Clearance (BPH) None   Lung Expansion None   Respiratory Medications Patient receives maintenance therapy at home   Oxygen None   Severity Index   Severity Index MILD   Outcomes   Secretion Clearance (BPH)  (n/a)   Lung expansion  (n/a)   Respiratory medication (n/a)   Oxygen goals (n/a)   Recommendations   Secretion Clearance (BPH) (n/a)   Lung Expansion (n/a)   Respiratory Medications (changed neb to scheduled q6wa due to patients breath sounds)   Performing Departments   O2 Device performing department formula [id number]   Resp assess adult performing department formula [id number]   Nebs given performing department formula [id number]   MDI Treatment performing department formula [id number]   changed duo-neb from prn to Q6wa per respiratory patient driven protocol.

## 2015-10-21 ENCOUNTER — Other Ambulatory Visit: Payer: Self-pay

## 2015-10-21 DIAGNOSIS — J45909 Unspecified asthma, uncomplicated: Secondary | ICD-10-CM

## 2015-10-21 DIAGNOSIS — F32A Depression, unspecified: Secondary | ICD-10-CM

## 2015-10-21 DIAGNOSIS — Q613 Polycystic kidney, unspecified: Secondary | ICD-10-CM

## 2015-10-21 DIAGNOSIS — N189 Chronic kidney disease, unspecified: Secondary | ICD-10-CM

## 2015-10-21 DIAGNOSIS — M109 Gout, unspecified: Secondary | ICD-10-CM

## 2015-10-21 DIAGNOSIS — N185 Chronic kidney disease, stage 5: Secondary | ICD-10-CM | POA: Insufficient documentation

## 2015-10-21 DIAGNOSIS — I10 Essential (primary) hypertension: Secondary | ICD-10-CM

## 2015-10-21 DIAGNOSIS — K219 Gastro-esophageal reflux disease without esophagitis: Secondary | ICD-10-CM

## 2015-10-21 LAB — BASIC METABOLIC PANEL
BUN: 45 mg/dL — ABNORMAL HIGH (ref 7.0–19.0)
CO2: 21 mEq/L — ABNORMAL LOW (ref 22–29)
Calcium: 10 mg/dL (ref 8.5–10.5)
Chloride: 110 mEq/L (ref 100–111)
Creatinine: 1.2 mg/dL — ABNORMAL HIGH (ref 0.6–1.0)
Glucose: 155 mg/dL — ABNORMAL HIGH (ref 70–100)
Potassium: 4.2 mEq/L (ref 3.5–5.1)
Sodium: 143 mEq/L (ref 136–145)

## 2015-10-21 LAB — GFR: EGFR: 46.1

## 2015-10-21 LAB — TACROLIMUS LEVEL: FK506: 8.7 ng/mL (ref 5.0–20.0)

## 2015-10-21 MED ORDER — PREDNISONE 5 MG PO TABS
ORAL_TABLET | ORAL | Status: AC
Start: 2015-10-21 — End: ?

## 2015-10-21 MED ORDER — PREDNISONE 10 MG PO TABS
20.0000 mg | ORAL_TABLET | Freq: Every morning | ORAL | 0 refills | Status: AC
Start: 2015-10-21 — End: ?
  Filled 2015-10-21: qty 4, 2d supply, fill #0

## 2015-10-21 MED ORDER — LEVOFLOXACIN 250 MG PO TABS
250.0000 mg | ORAL_TABLET | Freq: Every day | ORAL | 0 refills | Status: DC
Start: 2015-10-22 — End: 2018-03-10
  Filled 2015-10-21: qty 4, 4d supply, fill #0

## 2015-10-21 NOTE — Progress Notes (Signed)
Alter and oriented x4, VSS, o2 sat 96%RA. Denies SOB, however shaking after breathing treatment. [place] instructions given to pt after receiving IVPB Levaquin, states understanding of [place] instructions. PIV [place]'d. [place]'d with brother when family arrives to pick up within the hour per pt.

## 2015-10-21 NOTE — Discharge Summary (Signed)
MEDICINE DISCHARGE SUMMARY    Date Time: 10/21/2015 12:56 PM  Patient Name: Judy Barrett,Judy Barrett  Attending Physician: No att. providers found  Primary Care Physician: Christa See, MD    Date of Admission: 10/18/2015  Date of Discharge: 10/21/2015    Discharge Diagnoses:     Principal Diagnosis (Diagnosis after study, that is chiefly responsible for admission to inpatient status): <principal problem not specified>  Active Hospital Problems    Diagnosis POA   . Polycystic kidney disease Not Applicable   . Hypertension Unknown   . Asthma Unknown   . Gastroesophageal reflux disease Unknown   . Depression Unknown   . Chronic kidney disease Unknown   . Gout Unknown      Resolved Hospital Problems    Diagnosis POA   . Shortness of breath Yes       Disposition:      Home with family    Pending Results, Recommendations & Instructions to providers after discharge:     1. Micro / Labs / Path pending:   Unresulted Labs     None        2. Wound Care Instructions: n/a  3. Date of completion for antibiotics or other medications: 4 more days of levaquin     Recent Labs - Last 2:         Recent Labs  Lab 10/19/15  0015   WBC 10.28   HGB 12.6   HEMATOCRIT 42.2   PLATELETS 163         Recent Labs  Lab 10/19/15  0015   PT 14.6   PT INR 1.1   PTT 33       Recent Labs  Lab 10/19/15  0015   ALKALINE PHOSPHATASE 105   BILIRUBIN, TOTAL 0.9   PROTEIN, TOTAL 6.3   ALBUMIN 3.0*   ALT 23   AST (SGOT) 20        Recent Labs  Lab 10/21/15  0519 10/20/15  0442   SODIUM 143 140   POTASSIUM 4.2 4.6   CHLORIDE 110 106   CO2 21* 23   BUN 45.0* 39.0*   GLUCOSE 155* 152*   CALCIUM 10.0 10.3                 Invalid input(s): FREET4         Procedures/Radiology performed:   Radiology: all results from this admission  Nm Pulmonary Ventilation And Perfusion (aerosol Or Gas)    10/19/2015  Normal ventilation/perfusion scan. Marty Heck, MD 10/19/2015 5:22 PM     Xr Chest  Ap Portable    10/19/2015    1.  Low lung volumes. Right basilar opacity could  represent atelectasis or developing infiltrate. 2.  Possible small right pleural effusion. 3.  Left basilar atelectasis. Demetrios Isaacs, MD 10/19/2015 3:01 AM     Surgery: all results from this admission  * No surgery found [place] Course:     Reason for admission/ HPI: 59 yo F with PMHx for PCKD s/p renal transplant on chronic immunosuppressive therapy, asthma, HTN presented with SOB, chest tightness, coughing. Found to be hypoxic.     Hospital Course:     The patient was admitted to the medicine service. Initial CXR showed probable right lower lobe pneumonia. She was treated with empiric levaquin, increased steroids, and nebs. A VQ scan was done and showed no evidence of PE. She was able to be weaned to room air. She was continued  on her other chronic home medications. She was medically stable for discharge 11/28. She was given a prescription for levaquin and a short course of prednisone taper. After this acute prednisone taper, she may resume her chronic home dose of prednisone. She made an appointment with her PCP later this week as well.     Discharge Day Exam:  Temp:  [96.1 F (35.6 C)-97.4 F (36.3 C)] 96.1 F (35.6 C)  Heart Rate:  [62-91] 78  Resp Rate:  [18] 18  BP: (127-141)/(70) 127/70 mmHg    Wounds/decutibus ulcers/stage: n/a  Gen: alert, oriented x 3, NAD  Heart: RRR S1S2 no m/g/r  Lungs: CTA BL no rales/wheeze/ronchi  Abd: soft, NTND +BS  Ext: no edema     Pt reports feeling much better today. Breathing has improved. No cp/sob. No fevers/chills. No n/v/d.     Consultations:          Discharge Condition:     stable    Discharge Instructions & Follow Up Plan for Patient:     Diet: regualr    Activity/Weight Bearing Status: as tolerated      Patient was instructed to follow up with:     Follow-up Information     Follow up with Primary care doctor in [place]. Schedule an appointment as soon as possible for a visit in 3 days.    Why:  For hospital follow-up            Discharge Code Status:  full  Patient Emergency Contact: Ayse, Mccartin [phone]    Complete instructions and follow up are in the patient's After Visit Summary    Minutes spent coordinating discharge and reviewing discharge plan: 35 minutes    Discharge Medications:        Discharge Medication List      Taking          acetaminophen 325 MG tablet   Dose:  650 mg   Commonly known as:  TYLENOL   Take 650 mg by mouth as needed for Pain.       albuterol 108 (90 BASE) MCG/ACT inhaler   Dose:  2 puff   Commonly known as:  PROVENTIL HFA;VENTOLIN HFA   Inhale 2 puffs into the lungs every 4 (four) hours as needed.       allopurinol 300 MG tablet   Dose:  300 mg   Commonly known as:  ZYLOPRIM   Take 300 mg by mouth daily.       aspirin 81 MG chewable tablet   Dose:  81 mg   Chew 81 mg by mouth daily.       cycloSPORINE 0.05 % ophthalmic emulsion   Dose:  1 drop   Commonly known as:  RESTASIS   Place 1 drop into both eyes 2 (two) times daily.       FLUoxetine 20 MG capsule   Dose:  20 mg   Commonly known as:  PROzac   Take 20 mg by mouth daily.       fluticasone 50 MCG/ACT nasal spray   Dose:  1 spray   Commonly known as:  FLONASE   1 spray by Nasal route 2 (two) times daily.       furosemide 40 MG tablet   Dose:  40 mg   Commonly known as:  LASIX   Take 40 mg by mouth daily.       levoFLOXacin 250 MG tablet   Dose:  250 mg   Commonly  known as:  LEVAQUIN   Start taking on:  10/22/2015   Take 1 tablet (250 mg total) by mouth daily.       magnesium oxide 400 MG tablet   Dose:  400 mg   Commonly known as:  MAG-OX   Take 400 mg by mouth 2 (two) times daily.       mycophenolate 180 MG EC tablet   Commonly known as:  MYFORTIC   Take by mouth 2 (two) times daily.       pantoprazole 40 MG tablet   Dose:  40 mg   Commonly known as:  PROTONIX   Take 40 mg by mouth 2 (two) times daily.       * predniSONE 10 MG tablet   Dose:  20 mg   What changed:  You were already taking a medication with the same name, and this prescription was added. Make sure you  understand how and when to take each.   Commonly known as:  DELTASONE   Take 2 tablets (20mg ) for 1 more day; then take 1 tablet (10 mg) for 2 days, then stop.       * predniSONE 5 MG tablet   What changed:    - how to take this  - when to take this  - additional instructions   Commonly known as:  DELTASONE   5 mg every morning, 2.5 mg every evening; resume after finish steroid taper from hospital       SYMBICORT IN   Inhale into the lungs 2 (two) times daily.       * tacrolimus 1 MG capsule   Dose:  1 mg   Commonly known as:  PROGRAF   Take 1 mg by mouth every morning. 1mg  AM and 0.5mg  HS       * tacrolimus 0.5 MG capsule   Dose:  0.5 mg   Commonly known as:  PROGRAF   Take 0.5 mg by mouth every evening.       travoprost (benzalkonium) 0.004 % ophthalmic solution   Dose:  1 drop   Commonly known as:  TRAVATAN   Place 1 drop into both eyes nightly.       vitamin B-12 1000 MCG tablet   Dose:  1000 mcg   Commonly known as:  CYANOCOBALAMIN   Take 1,000 mcg by mouth daily.       * Notice:  This list has 4 medication(s) that are the same as other medications prescribed for you. Read the directions carefully, and ask your doctor or other care provider to review them with you.            (FYI: you must refresh the link after final D/C Med reconciliation)    Immunizations provided:         [facility]  Department of Medicine  P: [phone]  F: [phone]    Signed by: Adella Hare, PA-C    CC: Christa See, MD

## 2015-10-21 NOTE — Plan of Care (Signed)
Problem: Inadequate Gas Exchange  Goal: Adequately oxygenating and ventilation is improved  Outcome: Progressing  Intervention: Encourage/Assist patient as needed to turn, cough and perform deep breathing every 2 hours.  Denies any SOB or chest tightness. On RA. Encouraged to cough and deep breath and use of IS while awake. HOB elevated. Denies any pain. Instructed to use call bell for assistance. No signs of respiratory distress noted.

## 2016-01-31 IMAGING — CT CT CHEST HIGH RESOLUTION W/O CM
2 of 6 series · 8 of 36 positions shown, 10 images · non-contrast
Comparison: 08/01/2009.

CLINICAL DATA: Recurrent cough, bronchitis. Question interstitial
lung disease.

EXAM:
CT CHEST WITHOUT CONTRAST
TECHNIQUE: Multidetector CT imaging of the chest was performed following the
standard protocol without intravenous contrast. High resolution
imaging of the lungs, as well as inspiratory and expiratory imaging,
was performed.

[Series 8: hires retro entire lungs · axial · 0.66mm/px · z∈[-238,-68]mm · 5 of 27 slices shown, 7 images]
[im 5/27  mediastinal]
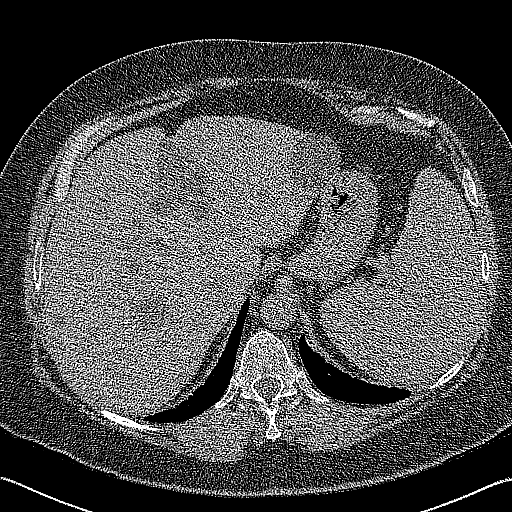
[im 5/27  lung]
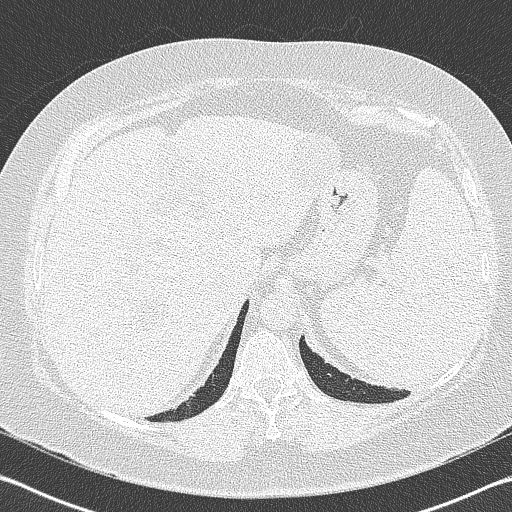
[im 9/27  lung]
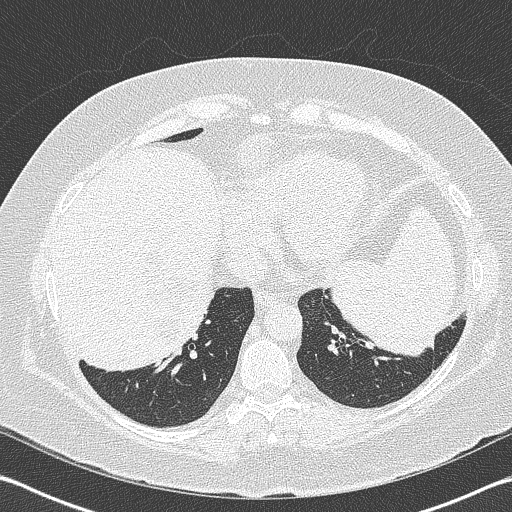
[im 14/27  lung]
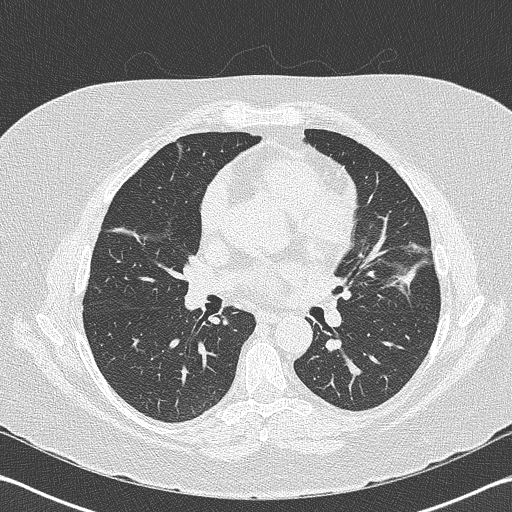
[im 18/27  lung]
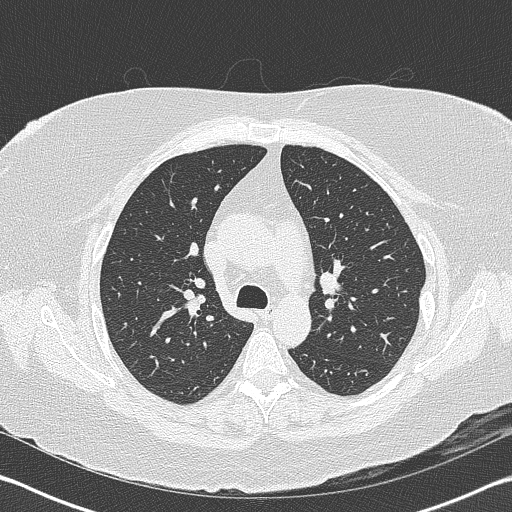
[im 22/27  mediastinal]
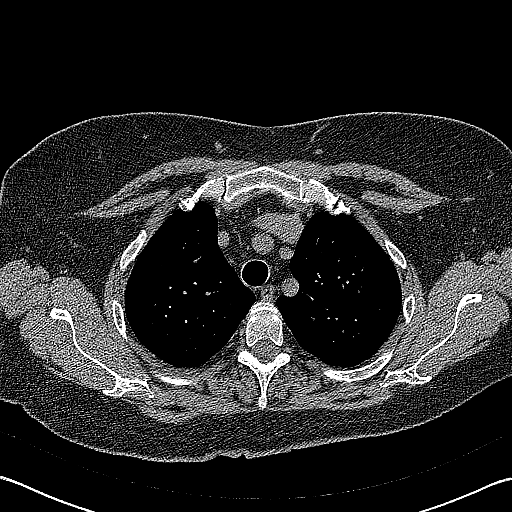
[im 22/27  lung]
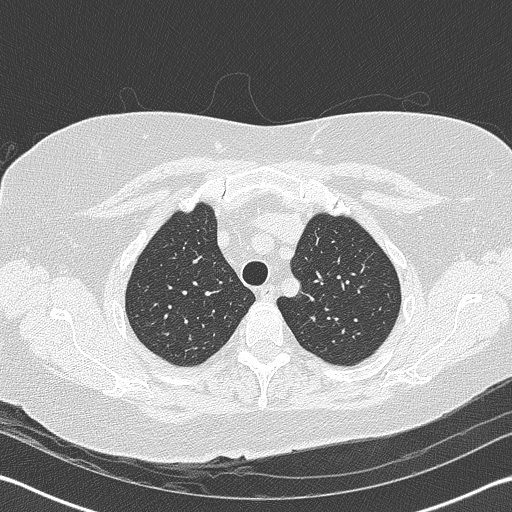

[Series 602: cor · coronal · 0.66mm/px · 3 of 111 slices shown]
[im 23/111  lung]
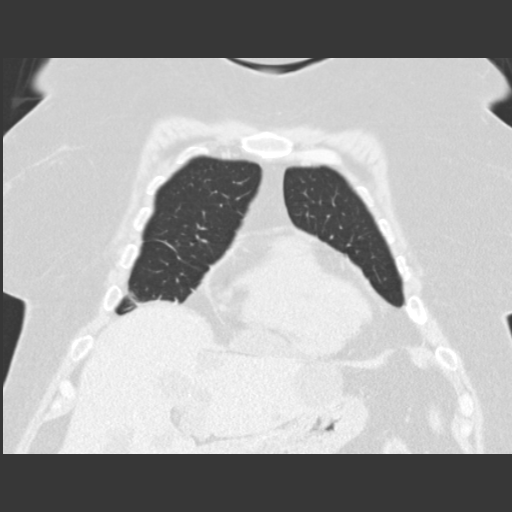
[im 45/111  lung]
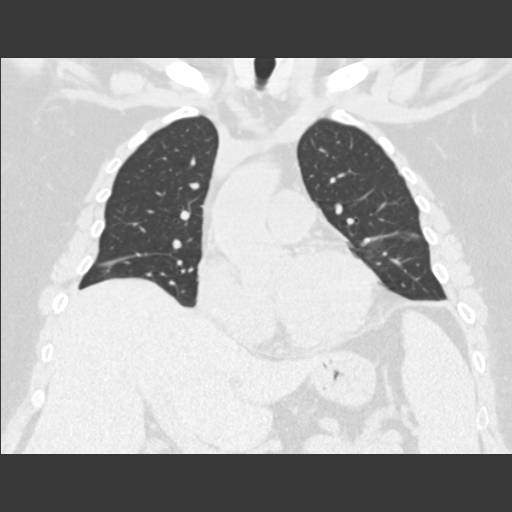
[im 67/111  lung]
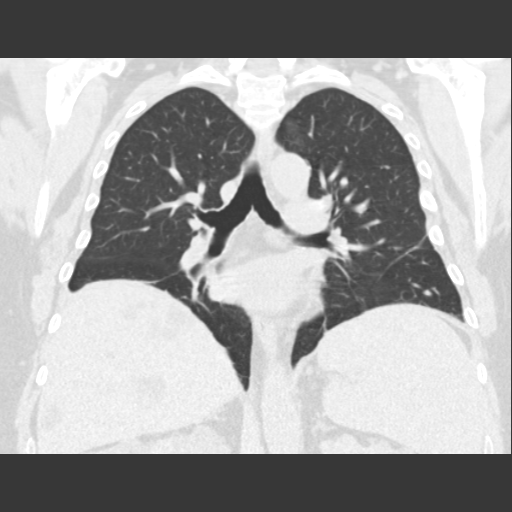

[8 of 36 positions shown; findings below may reference images not displayed]

FINDINGS: No pathologically enlarged mediastinal or axillary lymph nodes.
Hilar regions are difficult to definitively evaluate without IV
contrast. Heart size within normal limits. A 2.3 x 4.7 cm
low-density lesion is seen along the right lateral base of the heart
(coronal image 18). Lesion may be slightly larger than on
08/01/2009. No pericardial effusion.

Scattered scarring in the lung bases. No subpleural reticulation,
traction bronchiectasis/ bronchiolectasis, ground-glass,
architectural distortion or honeycombing. No pleural fluid. No air
trapping. Airway is unremarkable.

Incidental imaging of the upper abdomen shows numerous cysts in the
liver, measuring up to 3.8 cm in the left hepatic lobe. Visualized
portions of the gallbladder and adrenal glands are unremarkable.
Numerous low-attenuation lesions are seen in the visualized portions
of the kidneys, as on 05/15/2013. Spleen appears prominent but is
incompletely imaged. Visualized portions of the pancreas and stomach
are unremarkable. No worrisome lytic or sclerotic lesions.
IMPRESSION: 1. No evidence of interstitial lung disease.
2. Slight interval enlargement of a probable pericardial cyst.
3. Hepatic and renal cysts, as on 05/15/2013.

## 2016-07-02 IMAGING — US US CAROTID DUPLEX BILAT
1 series · 13 of 24 positions shown · non-contrast
Comparison: None.

CLINICAL DATA: Asymptomatic left carotid bruit, visual disturbance,
hyperlipidemia

EXAM:
BILATERAL CAROTID DUPLEX ULTRASOUND
TECHNIQUE: Gray scale imaging, color Doppler and duplex ultrasound were
performed of bilateral carotid and vertebral arteries in the neck.

[Series 1: us carotid duplex bilat · 0.08mm/px · 13 of 65 slices shown]
[im 1/65]
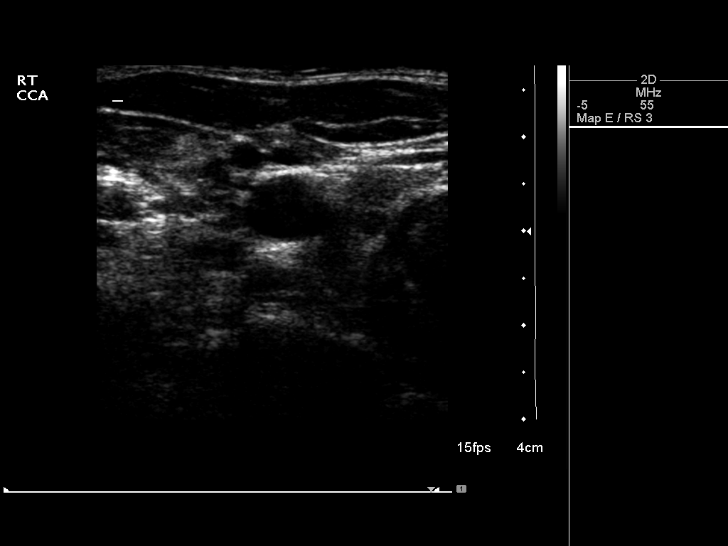
[im 6/65]
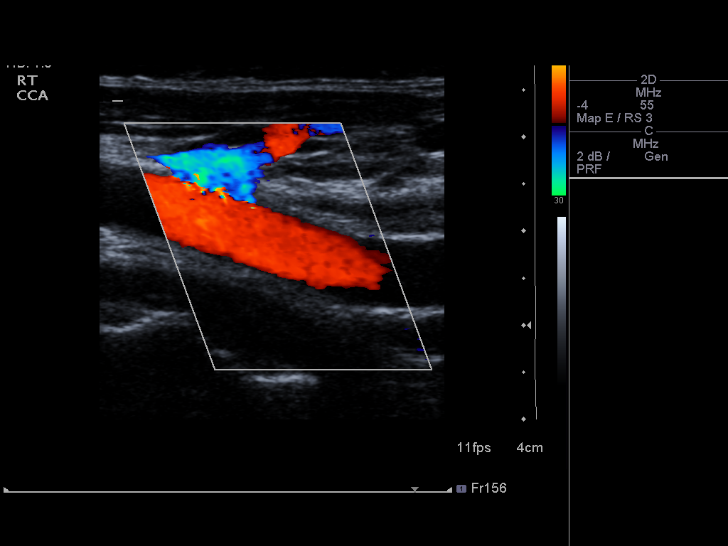
[im 12/65]
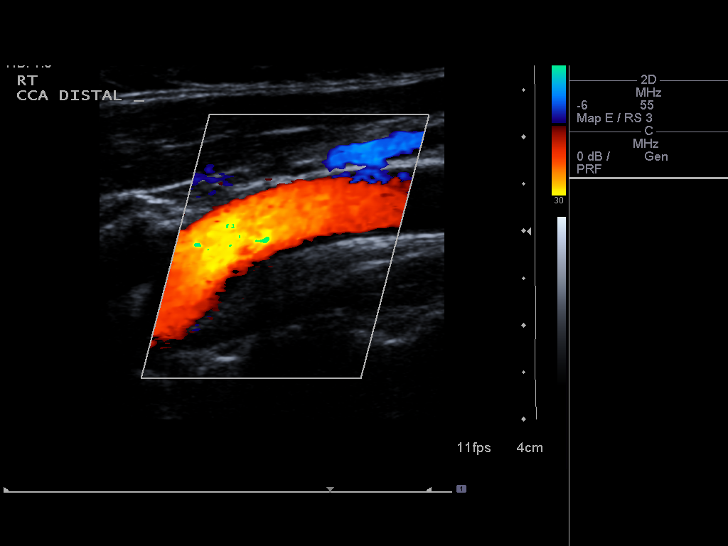
[im 17/65]
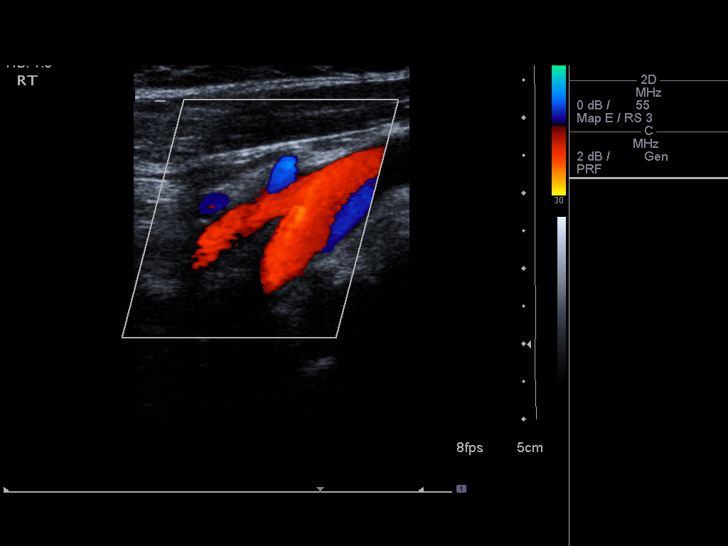
[im 23/65]
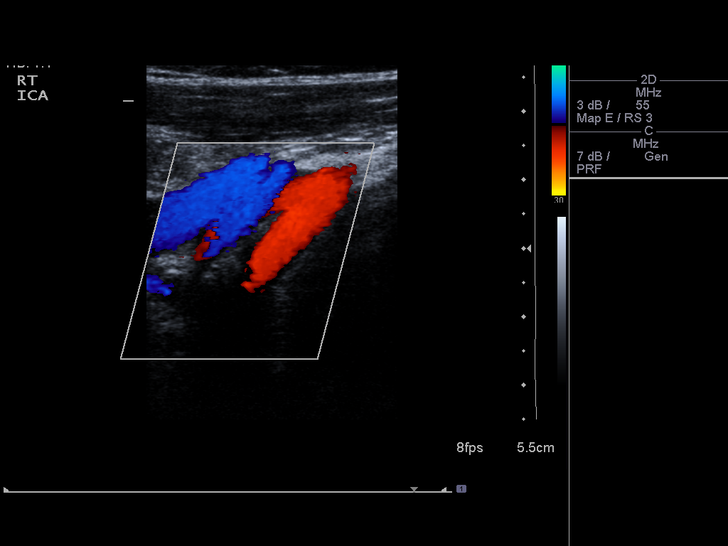
[im 28/65]
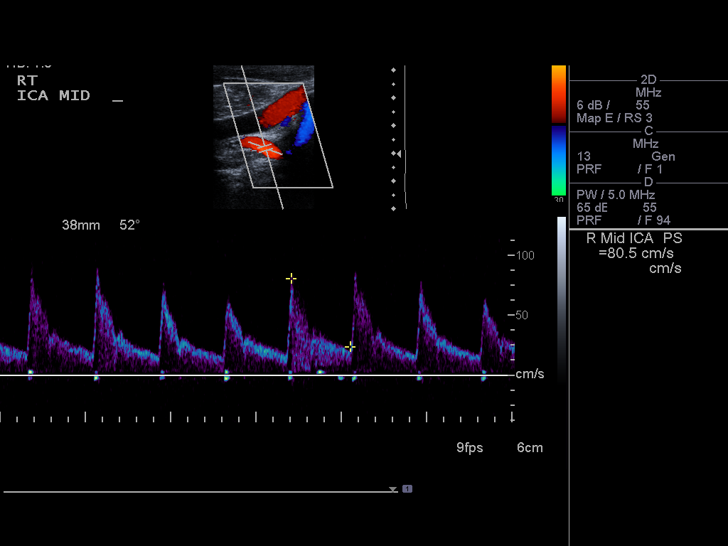
[im 34/65]
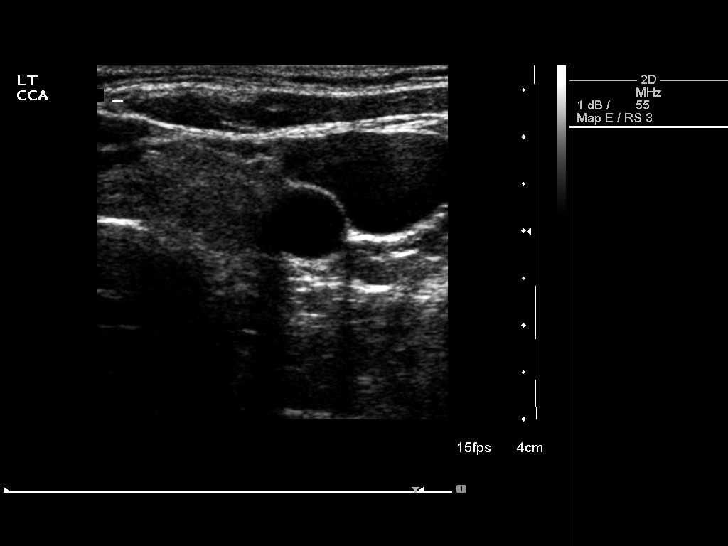
[im 37/65]
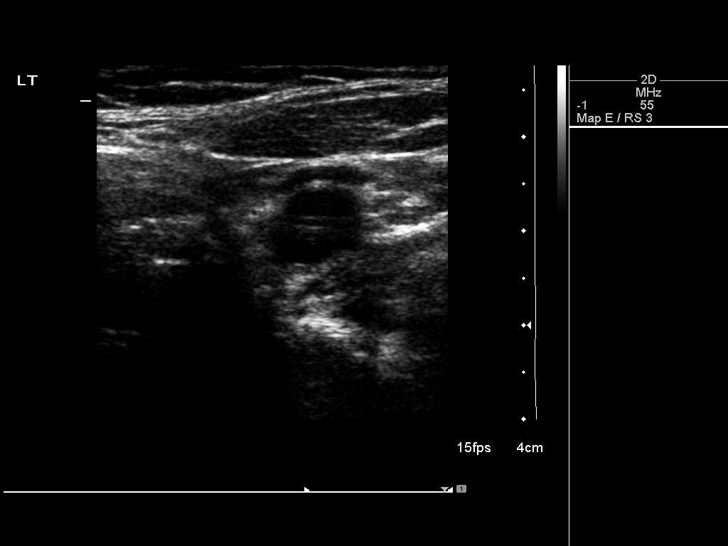
[im 42/65]
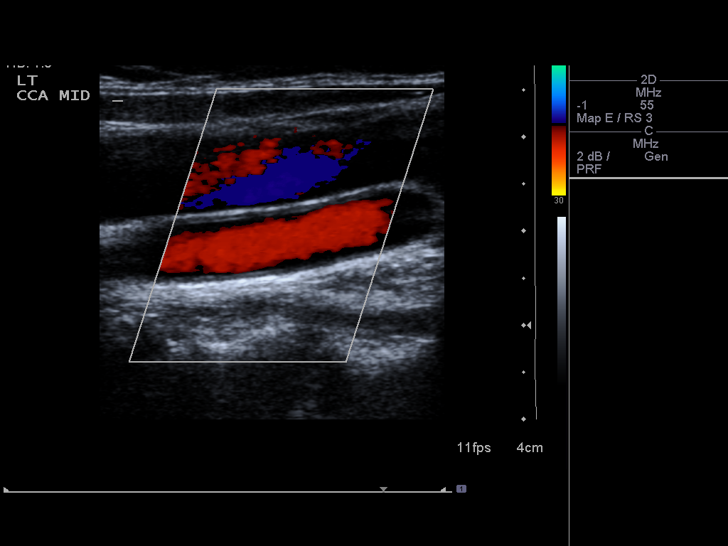
[im 48/65]
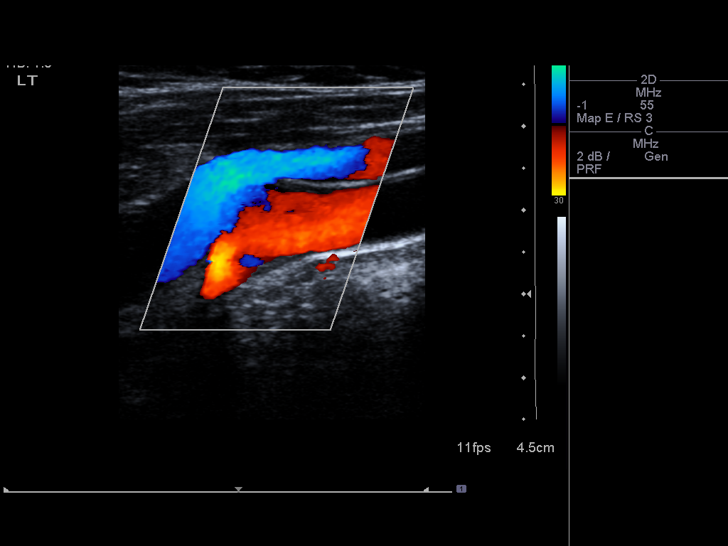
[im 53/65]
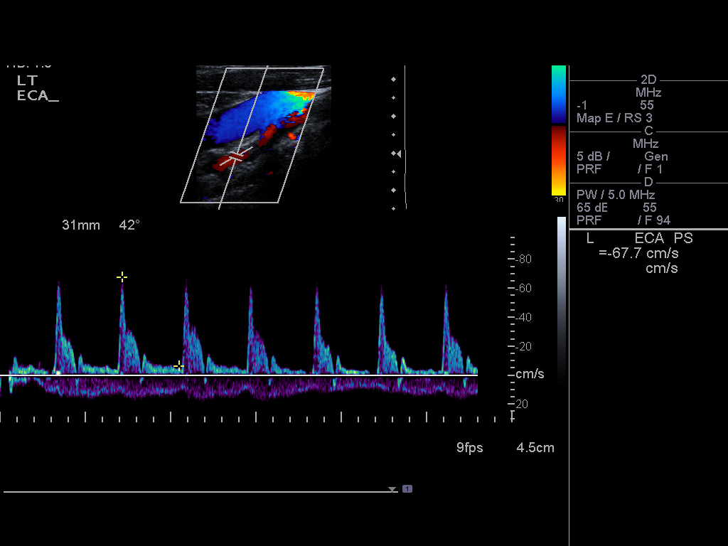
[im 59/65]
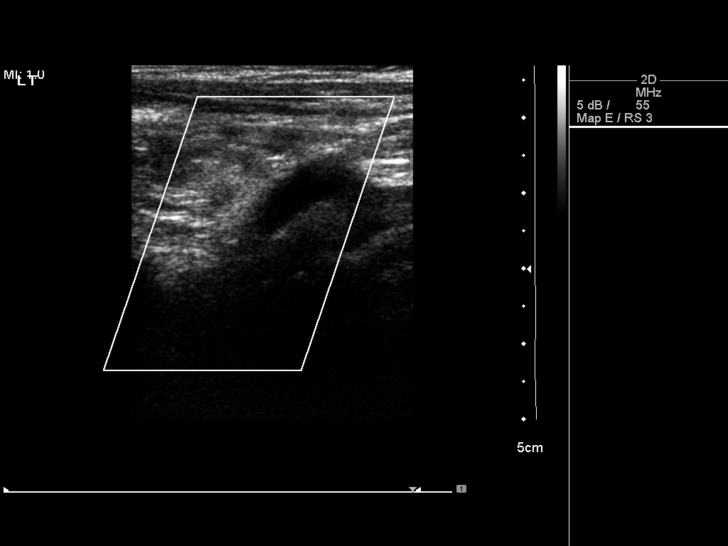
[im 65/65]
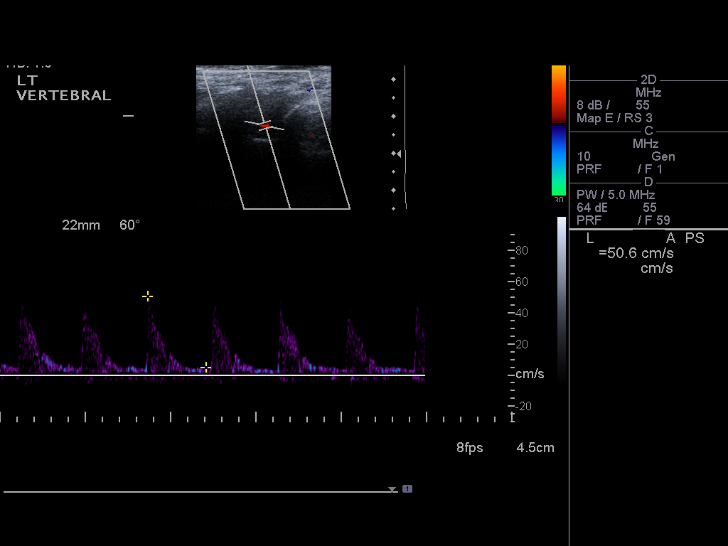

[13 of 24 positions shown; findings below may reference images not displayed]

FINDINGS: Criteria: Quantification of carotid stenosis is based on velocity
parameters that correlate the residual internal carotid diameter
with NASCET-based stenosis levels, using the diameter of the distal
internal carotid lumen as the denominator for stenosis measurement.

The following velocity measurements were obtained:

RIGHT

ICA:  81/24 cm/sec

CCA:  90/9 cm/sec

SYSTOLIC ICA/CCA RATIO:

DIASTOLIC ICA/CCA RATIO:

ECA:  76 cm/sec

LEFT

ICA:  60/20 cm/sec

CCA:  96/14 cm/sec

SYSTOLIC ICA/CCA RATIO:

DIASTOLIC ICA/CCA RATIO:

ECA:  70 cm/sec

RIGHT CAROTID ARTERY: Minor intimal thickening. No hemodynamically
significant right ICA stenosis, velocity elevation, or turbulent
flow. Degree of narrowing less than 50%.

RIGHT VERTEBRAL ARTERY:  Antegrade

LEFT CAROTID ARTERY: Similar minor intimal thickening. No
hemodynamically significant left ICA stenosis, velocity elevation,
or turbulent flow.

LEFT VERTEBRAL ARTERY:  Antegrade
IMPRESSION: Minor carotid intimal thickening.  No significant atherosclerosis.

No hemodynamically significant ICA stenosis by ultrasound.

## 2016-07-15 ENCOUNTER — Encounter (HOSPITAL_COMMUNITY): Payer: Self-pay

## 2016-07-15 ENCOUNTER — Emergency Department (HOSPITAL_COMMUNITY): Payer: Medicare Other

## 2016-07-15 ENCOUNTER — Inpatient Hospital Stay (HOSPITAL_COMMUNITY)
Admission: EM | Admit: 2016-07-15 | Discharge: 2016-07-16 | DRG: 202 | Disposition: A | Discharge status: Home / Self Care | Payer: Medicare Other | Attending: Internal Medicine | Admitting: Internal Medicine

## 2016-07-15 DIAGNOSIS — R0602 Shortness of breath: Secondary | ICD-10-CM | POA: Diagnosis present

## 2016-07-15 DIAGNOSIS — K219 Gastro-esophageal reflux disease without esophagitis: Secondary | ICD-10-CM | POA: Diagnosis present

## 2016-07-15 DIAGNOSIS — E785 Hyperlipidemia, unspecified: Secondary | ICD-10-CM | POA: Diagnosis not present

## 2016-07-15 DIAGNOSIS — J45901 Unspecified asthma with (acute) exacerbation: Principal | ICD-10-CM | POA: Diagnosis present

## 2016-07-15 DIAGNOSIS — H409 Unspecified glaucoma: Secondary | ICD-10-CM | POA: Diagnosis not present

## 2016-07-15 DIAGNOSIS — Z79899 Other long term (current) drug therapy: Secondary | ICD-10-CM | POA: Diagnosis not present

## 2016-07-15 DIAGNOSIS — J189 Pneumonia, unspecified organism: Secondary | ICD-10-CM | POA: Diagnosis present

## 2016-07-15 DIAGNOSIS — F329 Major depressive disorder, single episode, unspecified: Secondary | ICD-10-CM | POA: Diagnosis not present

## 2016-07-15 DIAGNOSIS — Z940 Kidney transplant status: Secondary | ICD-10-CM

## 2016-07-15 DIAGNOSIS — R0902 Hypoxemia: Secondary | ICD-10-CM | POA: Diagnosis present

## 2016-07-15 DIAGNOSIS — Z87891 Personal history of nicotine dependence: Secondary | ICD-10-CM | POA: Diagnosis not present

## 2016-07-15 DIAGNOSIS — Z7951 Long term (current) use of inhaled steroids: Secondary | ICD-10-CM | POA: Diagnosis not present

## 2016-07-15 DIAGNOSIS — Q613 Polycystic kidney, unspecified: Secondary | ICD-10-CM

## 2016-07-15 DIAGNOSIS — J4520 Mild intermittent asthma, uncomplicated: Secondary | ICD-10-CM | POA: Diagnosis present

## 2016-07-15 DIAGNOSIS — Z7982 Long term (current) use of aspirin: Secondary | ICD-10-CM | POA: Diagnosis not present

## 2016-07-15 DIAGNOSIS — E782 Mixed hyperlipidemia: Secondary | ICD-10-CM | POA: Diagnosis present

## 2016-07-15 DIAGNOSIS — Z8249 Family history of ischemic heart disease and other diseases of the circulatory system: Secondary | ICD-10-CM

## 2016-07-15 DIAGNOSIS — F32A Depression, unspecified: Secondary | ICD-10-CM | POA: Diagnosis present

## 2016-07-15 LAB — BASIC METABOLIC PANEL
ANION GAP: 9 (ref 5–15)
BUN: 22 mg/dL — ABNORMAL HIGH (ref 6–20)
CHLORIDE: 105 mmol/L (ref 101–111)
CO2: 24 mmol/L (ref 22–32)
CREATININE: 1.3 mg/dL — AB (ref 0.44–1.00)
Calcium: 10 mg/dL (ref 8.9–10.3)
GFR calc non Af Amer: 44 mL/min — ABNORMAL LOW (ref >60)
GFR, EST AFRICAN AMERICAN: 51 mL/min — AB (ref >60)
Glucose, Bld: 145 mg/dL — ABNORMAL HIGH (ref 65–99)
POTASSIUM: 4.3 mmol/L (ref 3.5–5.1)
SODIUM: 138 mmol/L (ref 135–145)

## 2016-07-15 LAB — I-STAT TROPONIN, ED: Troponin i, poc: 0 ng/mL (ref 0.00–0.08)

## 2016-07-15 LAB — CBC
HCT: 44.9 % (ref 36.0–46.0)
HEMOGLOBIN: 13.5 g/dL (ref 12.0–15.0)
MCH: 28.8 pg (ref 26.0–34.0)
MCHC: 30.1 g/dL (ref 30.0–36.0)
MCV: 95.9 fL (ref 78.0–100.0)
PLATELETS: 207 10*3/uL (ref 150–400)
RBC: 4.68 MIL/uL (ref 3.87–5.11)
RDW: 18.4 % — ABNORMAL HIGH (ref 11.5–15.5)
WBC: 10.5 10*3/uL (ref 4.0–10.5)

## 2016-07-15 LAB — BRAIN NATRIURETIC PEPTIDE: B NATRIURETIC PEPTIDE 5: 94.5 pg/mL (ref 0.0–100.0)

## 2016-07-15 LAB — I-STAT CG4 LACTIC ACID, ED: LACTIC ACID, VENOUS: 0.82 mmol/L (ref 0.5–1.9)

## 2016-07-15 MED ORDER — MYCOPHENOLATE SODIUM 180 MG PO TBEC
180.0000 mg | DELAYED_RELEASE_TABLET | Freq: Two times a day (BID) | ORAL | Status: DC
  Administered 2016-07-16 (×2): 180 mg via ORAL
  Filled 2016-07-15 (×2): qty 1

## 2016-07-15 MED ORDER — ALLOPURINOL 100 MG PO TABS
300.0000 mg | ORAL_TABLET | Freq: Every day | ORAL | Status: DC
  Administered 2016-07-16: 300 mg via ORAL
  Filled 2016-07-15: qty 3

## 2016-07-15 MED ORDER — ASPIRIN EC 81 MG PO TBEC
81.0000 mg | DELAYED_RELEASE_TABLET | Freq: Every day | ORAL | Status: DC
  Administered 2016-07-16: 81 mg via ORAL
  Filled 2016-07-15: qty 1

## 2016-07-15 MED ORDER — VITAMIN B-12 100 MCG PO TABS
100.0000 ug | ORAL_TABLET | Freq: Every day | ORAL | Status: DC
  Administered 2016-07-16: 100 ug via ORAL
  Filled 2016-07-15: qty 1

## 2016-07-15 MED ORDER — CITALOPRAM HYDROBROMIDE 10 MG PO TABS: 20.0000 mg | ORAL_TABLET | Freq: Every day | ORAL | Status: DC

## 2016-07-15 MED ORDER — METHYLPREDNISOLONE SODIUM SUCC 40 MG IJ SOLR
40.0000 mg | Freq: Four times a day (QID) | INTRAMUSCULAR | Status: DC
  Administered 2016-07-16: 40 mg via INTRAVENOUS
  Filled 2016-07-15: qty 1

## 2016-07-15 MED ORDER — PANTOPRAZOLE SODIUM 40 MG PO TBEC
40.0000 mg | DELAYED_RELEASE_TABLET | Freq: Two times a day (BID) | ORAL | Status: DC
  Administered 2016-07-16 (×2): 40 mg via ORAL
  Filled 2016-07-15 (×2): qty 1

## 2016-07-15 MED ORDER — IPRATROPIUM BROMIDE 0.02 % IN SOLN
0.5000 mg | Freq: Four times a day (QID) | RESPIRATORY_TRACT | Status: DC
  Filled 2016-07-15: qty 2.5

## 2016-07-15 MED ORDER — LEVOFLOXACIN IN D5W 750 MG/150ML IV SOLN
750.0000 mg | Freq: Once | INTRAVENOUS | Status: AC
  Administered 2016-07-15: 750 mg via INTRAVENOUS
  Filled 2016-07-15: qty 150

## 2016-07-15 MED ORDER — MAGNESIUM OXIDE 400 (241.3 MG) MG PO TABS
400.0000 mg | ORAL_TABLET | Freq: Two times a day (BID) | ORAL | Status: DC
  Administered 2016-07-16 (×2): 400 mg via ORAL
  Filled 2016-07-15 (×2): qty 1

## 2016-07-15 MED ORDER — LEVOFLOXACIN IN D5W 750 MG/150ML IV SOLN: 750.0000 mg | INTRAVENOUS | Status: DC

## 2016-07-15 MED ORDER — FUROSEMIDE 20 MG PO TABS
40.0000 mg | ORAL_TABLET | Freq: Every day | ORAL | Status: DC
  Administered 2016-07-16: 40 mg via ORAL
  Filled 2016-07-15: qty 2

## 2016-07-15 MED ORDER — SODIUM CHLORIDE 0.9% FLUSH
3.0000 mL | Freq: Two times a day (BID) | INTRAVENOUS | Status: DC
  Administered 2016-07-16 (×2): 3 mL via INTRAVENOUS

## 2016-07-15 MED ORDER — METHYLPREDNISOLONE SODIUM SUCC 125 MG IJ SOLR
125.0000 mg | Freq: Once | INTRAMUSCULAR | Status: AC
  Administered 2016-07-15: 125 mg via INTRAVENOUS
  Filled 2016-07-15: qty 2

## 2016-07-15 MED ORDER — ENOXAPARIN SODIUM 40 MG/0.4ML ~~LOC~~ SOLN
40.0000 mg | Freq: Every day | SUBCUTANEOUS | Status: DC
  Administered 2016-07-16: 40 mg via SUBCUTANEOUS
  Filled 2016-07-15: qty 0.4

## 2016-07-15 MED ORDER — ALBUTEROL SULFATE (2.5 MG/3ML) 0.083% IN NEBU: 2.5000 mg | INHALATION_SOLUTION | RESPIRATORY_TRACT | Status: DC | PRN

## 2016-07-15 MED ORDER — TACROLIMUS 1 MG PO CAPS: 1.0000 mg | ORAL_CAPSULE | Freq: Two times a day (BID) | ORAL | Status: DC

## 2016-07-15 MED ORDER — ALBUTEROL SULFATE (2.5 MG/3ML) 0.083% IN NEBU
2.5000 mg | INHALATION_SOLUTION | Freq: Four times a day (QID) | RESPIRATORY_TRACT | Status: DC
  Filled 2016-07-15: qty 3

## 2016-07-15 NOTE — Progress Notes (Signed)
Received report

## 2016-07-15 NOTE — ED Notes (Signed)
O2 sats 80% RA, placed on 2.5L [place]. O2 sats raised to 98%.

## 2016-07-15 NOTE — H&P (Signed)
History and Physical    April [name] [id number] DOB: 05-Jan-1956 DOA: 07/15/2016  PCP: [name], MD   Patient coming from: PCPs office.  Chief Complaint: Shortness of breath.  HPI: April [name] is a 60 y.o. female with medical history significant of arthritis, polycystic kidney disease, renal transplant recipient, asthma, depression, GERD, hyperlipidemia, hyperparathyroidism who comes to emergency department from her PCPs office due to fever and hypoxia.  Per patient, on Saturday evening she started feeling body aches. She subsequently went to sleep and woke up around 0200 Sunday morning with cough and had to use her rescue inhaler. She stayed awake for some time and subsequently went to sleep. When she woke up later in the morning, she felt that her body aches were worse and fatigued. She continue using her Symbicort and albuterol inhalers on Sunday. She went to sleep later in the evening and woke up early morning Monday with cough, chills, rigors and night sweats. She also complains of having decreased appetite, poor sleep and mild nausea. The same pattern continued to happen until Wednesday when she decided to visit her physician who suggested to her to come to the emergency department due to fever and hypoxia.   ED Course: In the ER, the patient was found to be hypoxic and wheezing. She was given supplemental oxygen, nebulized bronchodilators, IV Solu-Medrol 125 mg 1 and Levaquin 750 mg IVP reporting relief.  Review of Systems: As per HPI otherwise 10 point review of systems negative. Blood work was unremarkable with a normal WBC, but chest x-ray showed right base atelectasis versus early pneumonia.   Past Medical History:  Diagnosis Date  . Arthritis   . Asthma   . Depression   . GERD (gastroesophageal reflux disease)   . Hyperlipidemia   . Hyperparathyroidism   . Polycystic kidney   . PONV (postoperative nausea and vomiting)     Past Surgical History:    Procedure Laterality Date  . 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 BILATERAL OOPHORECTOMY    . CARPAL TUNNEL RELEASE    . CESAREAN SECTION    . INSERTION OF DIALYSIS CATHETER  03/31/2012   Procedure: INSERTION OF DIALYSIS CATHETER;  Surgeon: Christopher S Dickson, MD;  Location: [facility] OR;  Service: Vascular;  Laterality: N/A;  insertion of dialysis catheter right internal jugular vein  . KIDNEY TRANSPLANT     07 /09/2012  . TONSILLECTOMY    . TUBAL LIGATION       reports that she quit smoking about 18 years ago. Her smoking use included Cigarettes. She has a 3.75 pack-year smoking history. She has never used smokeless tobacco. She reports that she drinks alcohol. She reports that she does not use drugs.  Allergies  Allergen Reactions  . Infed [Iron Dextran] Other (See Comments)    Chest tightness  . Pentamidine Itching, Shortness Of Breath and Swelling  . Erythromycin [Erythromycin] Other (See Comments)    Mouth Ulcers  . Oxycodone Nausea Only and Nausea And Vomiting  . Erythromycin Rash    Causes breakout in mouth  . Ultram [Tramadol Hcl] Anxiety    Family History  Problem Relation Age of Onset  . Hypertension Mother   . Heart disease Father     CABG history     Prior to Admission medications   Medication Sig Start Date End Date Taking? Authorizing Provider  allopurinol (ZYLOPRIM) 100 MG tablet Take 200 mg by mouth daily.    Historical Provider, MD  aspirin EC 81 MG tablet Take 81 mg by mouth daily.    Historical Provider, MD  citalopram (CELEXA) 20 MG tablet Take 20 mg by mouth daily.    Historical Provider, MD  fexofenadine (ALLEGRA) 30 MG/5ML suspension Take 30 mg by mouth as needed.    Historical Provider, MD  furosemide (LASIX) 40 MG tablet Take 1 tablet by mouth daily. 04/20/14   Historical Provider, MD  ipratropium (ATROVENT HFA) 17 MCG/ACT inhaler Inhale 2 puffs into the lungs every 6 (six) hours as needed.     Historical Provider, MD   magnesium oxide (MAG-OX) 400 MG tablet Take 400 mg by mouth 2 (two) times daily.    Historical Provider, MD  mycophenolate (MYFORTIC) 180 MG EC tablet Take 180 mg by mouth 2 (two) times daily.    Historical Provider, MD  pantoprazole (PROTONIX) 40 MG tablet Take 40 mg by mouth daily.    Historical Provider, MD  predniSONE (DELTASONE) 5 MG tablet Take 2.5-5 mg by mouth 2 (two) times daily. Take 1 tablet every morning and take 1/2 tablet every evening    Historical Provider, MD  tacrolimus (PROGRAF) 1 MG capsule Take 1 mg by mouth 2 (two) times daily.    Historical Provider, MD  vitamin B-12 (CYANOCOBALAMIN) 100 MCG tablet Take 100 mcg by mouth daily.    Historical Provider, MD    Physical Exam: Vitals:   07/15/16 2105 07/15/16 2207 07/15/16 2230 07/15/16 2300  BP:  122/67 113/62 116/73  Pulse: 84 86 79 86  Resp: 15 21 18 21   Temp:      TempSrc:      SpO2: 98% 96% 96% 97%      Constitutional: NAD, calm, comfortable Vitals:   07/15/16 2105 07/15/16 2207 07/15/16 2230 07/15/16 2300  BP:  122/67 113/62 116/73  Pulse: 84 86 79 86  Resp: 15 21 18 21   Temp:      TempSrc:      SpO2: 98% 96% 96% 97%   Eyes: PERRL, lids and conjunctivae normal ENMT: Mucous membranes are moist. Posterior pharynx clear of any exudate or lesions.  Neck: normal, supple, no masses, no thyromegaly Respiratory: Bibasilar rales. Mild wheezing and rhonchi bilaterally. No accessory muscle use. Cardiovascular: Regular rate and rhythm, no murmurs / rubs / gallops. No extremity edema. 2+ pedal pulses. No carotid bruits.  Abdomen: no tenderness, no masses palpated. No hepatosplenomegaly. Bowel sounds positive.  Musculoskeletal: no clubbing / cyanosis. No joint deformity upper and lower extremities. Good ROM, no contractures. Normal muscle tone.  Skin: Multiple ecchymotic areas on right upper extremities. Neurologic: CN 2-12 grossly intact. Sensation intact, DTR normal. Strength 5/5 in all 4.  Psychiatric: Normal  judgment and insight. Alert and oriented x 4. Normal mood.      Labs on Admission: I have personally reviewed following labs and imaging studies  CBC:  Recent Labs Lab 07/15/16 1723  WBC 10.5  HGB 13.5  HCT 44.9  MCV 95.9  PLT [id number]   Basic Metabolic Panel:  Recent Labs Lab 07/15/16 1723  NA 138  K 4.3  CL 105  CO2 24  GLUCOSE 145*  BUN 22*  CREATININE 1.30*  CALCIUM 10.0   GFR: CrCl cannot be calculated (Unknown ideal weight.).  Urine analysis:    Component Value Date/Time   COLORURINE YELLOW 05/15/2013 0733   APPEARANCEUR HAZY (A) 05/15/2013 0733   LABSPEC 1.025 05/15/2013 0733   PHURINE 6.0 05/15/2013 0733   GLUCOSEU NEGATIVE 05/15/2013 0733   HGBUR NEGATIVE  05/15/2013 [place] 05/15/2013 0733   KETONESUR NEGATIVE 05/15/2013 0733   PROTEINUR NEGATIVE 05/15/2013 0733   UROBILINOGEN 0.2 05/15/2013 0733   NITRITE NEGATIVE 05/15/2013 0733   LEUKOCYTESUR NEGATIVE 05/15/2013 0733     Radiological Exams on Admission: Dg Chest 2 View  Result Date: 07/15/2016 CLINICAL DATA:  Cough.  Chest pain and shortness of breath tonight. EXAM: CHEST  2 VIEW COMPARISON:  Chest radiographs earlier this day. Radiographs 07/02/2015 FINDINGS: Improved right basilar aeration from radiographs earlier this day. Minimal residual streaky opacities. Left basilar scarring again seen. Cardiomegaly is unchanged. Interstitial opacities possibly representing pulmonary edema are unchanged. No new airspace disease, pleural effusion or pneumothorax. The bones appear under mineralized. IMPRESSION: 1. Improved right basilar aeration from exam earlier this day, favor atelectasis, less likely early pneumonia. Left basilar scarring. 2. Unchanged cardiomegaly and interstitial opacities, may reflect mild pulmonary edema. 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 Electronically Signed   By: Jeb Levering M.D.   On: 07/15/2016 21:35     EKG: Independently reviewed.  Vent. rate 98 BPM PR interval 124 ms QRS duration 78 ms QT/QTc 348/444 ms P-R-T axes 58 78 22 Normal sinus rhythm Cannot rule out Anterior infarct , age undetermined Abnormal ECG  Assessment/Plan Principal Problem:   CAP (community acquired pneumonia)   Asthma exacerbation Admit to telemetry/inpatient. Continue supplemental oxygen. Continue nebulized bronchodilators. Hold maintenance and rescue metered-dose inhalers. Continue Solu-Medrol at 40 mg every 6 hours IVP and hold oral prednisone. Continue Levaquin 750 mg IVP daily. Follow-up blood cultures and sensitivity. Check a sputum Gram stain, culture and sensitivity. Check strep pneumoniae and legionella urine antigens.  Active Problems:   GERD (gastroesophageal reflux disease) Continue Pantoprazole 40 mg by mouth twice a day    Renal transplant recipient Prednisone on hold while getting IV methylprednisolone. Continue Myfortic 180 mg by mouth twice a day. Continue tacrolimus 1 mg in the morning and half a milligram at bedtime.    Glaucoma Continue Xalatan, Travatan Z and Restasis drops    Hyperlipidemia Continue lifestyle modifications.    Depression Continue Prozac 40 mg by mouth daily.    DVT prophylaxis: Lovenox SQ. Code Status: Full code. Family Communication: None. Disposition Plan: Admit for IV antibiotic therapy for several days. Consults called: N/A Admission status: Inpatient/telemetry.   Reubin Milan MD [facility] Pager [phone].  If 7PM-7AM, please contact night-coverage [email or web address] Password [facility]  07/15/2016, 11:24 PM

## 2016-07-15 NOTE — ED Notes (Signed)
EDP at bedside  

## 2016-07-15 NOTE — ED Provider Notes (Signed)
[facility] DEPT Provider Note   CSN: [id number] Arrival date & time: 07/15/16  [name]     History   Chief Complaint Chief Complaint  Patient presents with  . Shortness of Breath    HPI April [name] is a 60 y.o. female.  Patient presents today from her PCP office with cough, fever, and new onset hypoxia. Patient has a history of asthma, on Symbicort and albuterol, and ESRD s/p renal transplant on prograf, myfortic, and daily prednisone. She was in her usual state of health until late Saturday night when she developed diffuse body aches, chills, HA, and productive cough. She has been using her rescue inhaler multiple times a day with no relief. She went to her PCP's office today and was found to be febrile at 101 and hypoxic O2 93%. CXR was concerning for early PNA with mild cardiomegaly and mild interstitial prominence suggestive of low-grade compensated HF. She was sent here for treatment and further work up.       Past Medical History:  Diagnosis Date  . Arthritis   . Asthma   . Depression   . GERD (gastroesophageal reflux disease)   . Hyperlipidemia   . Hyperparathyroidism   . Polycystic kidney   . PONV (postoperative nausea and vomiting)     Patient Active Problem List   Diagnosis Date Noted  . Bronchitis, mucopurulent recurrent ([place]) 08/08/2014  . Chronic cough 07/24/2014  . End stage renal disease ([place]) 03/23/2012  . 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 BILATERAL OOPHORECTOMY    . CARPAL TUNNEL RELEASE    . CESAREAN SECTION    . INSERTION OF DIALYSIS CATHETER  03/31/2012   Procedure: INSERTION OF DIALYSIS CATHETER;  Surgeon: Angelia Mould, MD;  Location: [facility];  Service: Vascular;  Laterality: N/A;  insertion of dialysis catheter right internal jugular vein  . 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 TONSILLECTOMY    . TUBAL LIGATION      OB History    No data available       Home Medications    Prior to Admission medications   Medication Sig Start Date End Date Taking? Authorizing Provider  allopurinol (ZYLOPRIM) 100 MG tablet Take 200 mg by mouth daily.    Historical Provider, MD  aspirin EC 81 MG tablet Take 81 mg by mouth daily.    Historical Provider, MD  citalopram (CELEXA) 20 MG tablet Take 20 mg by mouth daily.    Historical Provider, MD  fexofenadine (ALLEGRA) 30 MG/5ML suspension Take 30 mg by mouth as needed.    Historical Provider, MD  furosemide (LASIX) 40 MG tablet Take 1 tablet by mouth daily. 04/20/14   Historical Provider, MD  ipratropium (ATROVENT HFA) 17 MCG/ACT inhaler Inhale 2 puffs into the lungs every 6 (six) hours as needed.     Historical Provider, MD  magnesium oxide (MAG-OX) 400 MG tablet Take 400 mg by mouth 2 (two) times daily.    Historical Provider, MD  mycophenolate (MYFORTIC) 180 MG EC tablet Take 180 mg by mouth 2 (two) times daily.    Historical Provider, MD  pantoprazole (PROTONIX) 40 MG tablet Take 40 mg by mouth daily.    Historical Provider, MD  predniSONE (DELTASONE) 5 MG tablet Take 2.5-5 mg by mouth 2 (two) times daily. Take 1 tablet every  morning and take 1/2 tablet every evening    Historical Provider, MD  tacrolimus (PROGRAF) 1 MG capsule Take 1 mg by mouth 2 (two) times daily.    Historical Provider, MD  vitamin B-12 (CYANOCOBALAMIN) 100 MCG tablet Take 100 mcg by mouth daily.    Historical Provider, MD    Family History Family History  Problem Relation Age of Onset  . Hypertension Mother   . Heart disease Father     CABG history    Social History Social History  Substance Use Topics  . Smoking status: Former Smoker    Packs/day: 0.25    Years: 15.00    Types: Cigarettes    Quit date: 03/22/1998  . Smokeless tobacco: Never Used  . Alcohol use Yes     Comment: social     Allergies   Infed [iron dextran];  Pentamidine; Erythromycin [erythromycin]; Oxycodone; Erythromycin; and Ultram [tramadol hcl]   Review of Systems Review of Systems  Constitutional: Positive for chills and fever.  HENT: Positive for congestion.   Eyes: Negative.   Respiratory: Positive for cough and shortness of breath.   Cardiovascular: Negative for chest pain and palpitations.  Gastrointestinal: Negative.   Endocrine: Negative.   Genitourinary: Negative.   Musculoskeletal: Negative.   Skin: Negative.   Allergic/Immunologic: Negative.   Neurological: Negative.   Hematological: Negative.   Psychiatric/Behavioral: Negative.    Physical Exam Updated Vital Signs BP 122/67   Pulse 86   Temp 97.6 F (36.4 C) (Oral)   Resp 21   SpO2 96%   Physical Exam  Constitutional: She is oriented to person, place, and time. She appears well-developed and well-nourished. No distress.  HENT:  Head: Normocephalic and atraumatic.  Eyes: Conjunctivae are normal.  Neck: Neck supple.  Cardiovascular: Normal rate and regular rhythm.   No murmur heard. Pulmonary/Chest: Effort normal. No respiratory distress. She has no wheezes.  Mild bibasilar crackles   Abdominal: Soft. Bowel sounds are normal. There is no tenderness.  Musculoskeletal: She exhibits no edema.  Neurological: She is alert and oriented to person, place, and time.  Skin: Skin is warm and dry.  Psychiatric: She has a normal mood and affect.  Nursing note and vitals reviewed.  ED Treatments / Results  Labs (all labs ordered are listed, but only abnormal results are displayed) Labs Reviewed  BASIC METABOLIC PANEL - Abnormal; Notable for the following:       Result Value   Glucose, Bld 145 (*)    BUN 22 (*)    Creatinine, Ser 1.30 (*)    GFR calc non Af Amer 44 (*)    GFR calc Af Amer 51 (*)    All other components within normal limits  CBC - Abnormal; Notable for the following:    RDW 18.4 (*)    All other components within normal limits  CULTURE, BLOOD  (ROUTINE X 2)  CULTURE, BLOOD (ROUTINE X 2)  BRAIN NATRIURETIC PEPTIDE  I-STAT TROPOININ, ED  I-STAT CG4 LACTIC ACID, ED    EKG  EKG Interpretation  Date/Time:  Wednesday July 15 2016 17:13:08 EDT Ventricular Rate:  98 PR Interval:  124 QRS Duration: 78 QT Interval:  348 QTC Calculation: 444 R Axis:   78 Text Interpretation:  Normal sinus rhythm Cannot rule out Anterior infarct , age undetermined Abnormal ECG No significant change since last tracing Confirmed by YAO  MD, DAVID ([id number]) on 07/15/2016 9:11:03 PM       Radiology Dg Chest 2 View  Result  Date: 07/15/2016 CLINICAL DATA:  Cough.  Chest pain and shortness of breath tonight. EXAM: CHEST  2 VIEW COMPARISON:  Chest radiographs earlier this day. Radiographs 07/02/2015 FINDINGS: Improved right basilar aeration from radiographs earlier this day. Minimal residual streaky opacities. Left basilar scarring again seen. Cardiomegaly is unchanged. Interstitial opacities possibly representing pulmonary edema are unchanged. No new airspace disease, pleural effusion or pneumothorax. The bones appear under mineralized. IMPRESSION: 1. Improved right basilar aeration from exam earlier this day, favor atelectasis, less likely early pneumonia. Left basilar scarring. 2. Unchanged cardiomegaly and interstitial opacities, may reflect mild pulmonary edema. 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 Electronically Signed   By: Jeb Levering M.D.   On: 07/15/2016 21:35    Procedures Procedures (including critical care time)  Medications Ordered in ED Medications  levofloxacin (LEVAQUIN) IVPB 750 mg (750 mg Intravenous New Bag/Given 07/15/16 2239)  methylPREDNISolone sodium succinate (SOLU-MEDROL) 125 mg/2 mL injection 125 mg (125 mg Intravenous Given 07/15/16 2239)     Initial Impression / Assessment and Plan / ED Course  I have reviewed the triage vital signs and the nursing notes.  Pertinent labs  & imaging results that were available during my care of the patient were reviewed by me and considered in my medical decision making (see chart for details).  Clinical Course   Hypoxia: With cough, fever, and possible PNA on x-ray. On immunosuppression for renal transplant. Started on IV Levaquin. Also with a history of asthma and increase use of home rescue inhaler. Given solumedrol 125 mg IV x 1. PCP concerned for possible new onset CHF - BNP pending. Admit to medicine for further work up and treatment.   Final Clinical Impressions(s) / ED Diagnoses   Final diagnoses:  None    New Prescriptions New Prescriptions   No medications on file     Velna Ochs, MD 07/15/16 [place], MD 07/16/16 [phone]

## 2016-07-15 NOTE — ED Triage Notes (Signed)
Per Pt, Pt is coming from PCP with positive PNA and fever. Pt reports SOB and cough since Saturday with chest pain that worsens with cough and deep breathing. Pt was sent over from PCP for further evaluation.

## 2016-07-16 ENCOUNTER — Encounter (HOSPITAL_COMMUNITY): Payer: Self-pay | Admitting: Internal Medicine

## 2016-07-16 DIAGNOSIS — J45901 Unspecified asthma with (acute) exacerbation: Principal | ICD-10-CM

## 2016-07-16 DIAGNOSIS — R0602 Shortness of breath: Secondary | ICD-10-CM | POA: Diagnosis not present

## 2016-07-16 LAB — CBC WITH DIFFERENTIAL/PLATELET
BASOS ABS: 0 10*3/uL (ref 0.0–0.1)
Basophils Relative: 0 %
EOS ABS: 0 10*3/uL (ref 0.0–0.7)
EOS PCT: 0 %
HCT: 42.5 % (ref 36.0–46.0)
HEMOGLOBIN: 12.7 g/dL (ref 12.0–15.0)
LYMPHS ABS: 0.4 10*3/uL — AB (ref 0.7–4.0)
Lymphocytes Relative: 5 %
MCH: 28.5 pg (ref 26.0–34.0)
MCHC: 29.9 g/dL — ABNORMAL LOW (ref 30.0–36.0)
MCV: 95.3 fL (ref 78.0–100.0)
Monocytes Absolute: 0 10*3/uL — ABNORMAL LOW (ref 0.1–1.0)
Monocytes Relative: 0 %
NEUTROS PCT: 95 %
Neutro Abs: 7 10*3/uL (ref 1.7–7.7)
PLATELETS: 177 10*3/uL (ref 150–400)
RBC: 4.46 MIL/uL (ref 3.87–5.11)
RDW: 18 % — ABNORMAL HIGH (ref 11.5–15.5)
WBC: 7.5 10*3/uL (ref 4.0–10.5)

## 2016-07-16 LAB — COMPREHENSIVE METABOLIC PANEL
ALBUMIN: 2.9 g/dL — AB (ref 3.5–5.0)
ALK PHOS: 103 U/L (ref 38–126)
ALT: 30 U/L (ref 14–54)
AST: 27 U/L (ref 15–41)
Anion gap: 8 (ref 5–15)
BUN: 32 mg/dL — AB (ref 6–20)
CALCIUM: 9.8 mg/dL (ref 8.9–10.3)
CHLORIDE: 102 mmol/L (ref 101–111)
CO2: 24 mmol/L (ref 22–32)
CREATININE: 1.3 mg/dL — AB (ref 0.44–1.00)
GFR calc Af Amer: 51 mL/min — ABNORMAL LOW (ref >60)
GFR calc non Af Amer: 44 mL/min — ABNORMAL LOW (ref >60)
GLUCOSE: 258 mg/dL — AB (ref 65–99)
Potassium: 4.3 mmol/L (ref 3.5–5.1)
SODIUM: 134 mmol/L — AB (ref 135–145)
Total Bilirubin: 0.9 mg/dL (ref 0.3–1.2)
Total Protein: 7.1 g/dL (ref 6.5–8.1)

## 2016-07-16 MED ORDER — FLUOXETINE HCL 40 MG PO CAPS: 40.0000 mg | ORAL_CAPSULE | Freq: Every day | ORAL | Status: DC

## 2016-07-16 MED ORDER — ALBUTEROL SULFATE HFA 108 (90 BASE) MCG/ACT IN AERS: 2.0000 | INHALATION_SPRAY | Freq: Four times a day (QID) | RESPIRATORY_TRACT | Status: DC | PRN

## 2016-07-16 MED ORDER — LATANOPROST 0.005 % OP SOLN
1.0000 [drp] | Freq: Every day | OPHTHALMIC | Status: DC
Start: 2016-07-16 — End: 2016-07-16

## 2016-07-16 MED ORDER — TACROLIMUS 0.5 MG PO CAPS
0.5000 mg | ORAL_CAPSULE | Freq: Every day | ORAL | Status: DC
  Administered 2016-07-16: 0.5 mg via ORAL
  Filled 2016-07-16 (×2): qty 1

## 2016-07-16 MED ORDER — FLUOXETINE HCL 20 MG PO CAPS
40.0000 mg | ORAL_CAPSULE | Freq: Every day | ORAL | Status: DC
  Administered 2016-07-16: 40 mg via ORAL
  Filled 2016-07-16 (×2): qty 2

## 2016-07-16 MED ORDER — LEVOFLOXACIN IN D5W 750 MG/150ML IV SOLN: 750.0000 mg | INTRAVENOUS | Status: DC

## 2016-07-16 MED ORDER — PREDNISONE 5 MG PO TABS: ORAL_TABLET | ORAL | 0 refills | Status: DC

## 2016-07-16 MED ORDER — PREDNISONE 20 MG PO TABS
40.0000 mg | ORAL_TABLET | Freq: Every day | ORAL | Status: DC
  Filled 2016-07-16: qty 2

## 2016-07-16 MED ORDER — LATANOPROST 0.005 % OP SOLN
1.0000 [drp] | Freq: Every day | OPHTHALMIC | Status: DC
  Administered 2016-07-16: 1 [drp] via OPHTHALMIC
  Filled 2016-07-16: qty 2.5

## 2016-07-16 MED ORDER — CYCLOSPORINE 0.05 % OP EMUL: 1.0000 [drp] | Freq: Two times a day (BID) | OPHTHALMIC | Status: DC

## 2016-07-16 MED ORDER — ALBUTEROL SULFATE HFA 108 (90 BASE) MCG/ACT IN AERS: 2.0000 | INHALATION_SPRAY | RESPIRATORY_TRACT | 0 refills | Status: DC | PRN

## 2016-07-16 MED ORDER — TACROLIMUS 1 MG PO CAPS
1.0000 mg | ORAL_CAPSULE | Freq: Every day | ORAL | Status: DC
  Administered 2016-07-16: 1 mg via ORAL
  Filled 2016-07-16 (×2): qty 1

## 2016-07-16 MED ORDER — ALPRAZOLAM 0.5 MG PO TABS: 0.5000 mg | ORAL_TABLET | Freq: Every evening | ORAL | Status: DC | PRN

## 2016-07-16 MED ORDER — TACROLIMUS 0.5 MG PO CAPS: 0.5000 mg | ORAL_CAPSULE | Freq: Every evening | ORAL | Status: DC

## 2016-07-16 MED ORDER — ALBUTEROL SULFATE (2.5 MG/3ML) 0.083% IN NEBU: 2.5000 mg | INHALATION_SOLUTION | RESPIRATORY_TRACT | Status: DC | PRN

## 2016-07-16 MED ORDER — TACROLIMUS 1 MG PO CAPS: 1.0000 mg | ORAL_CAPSULE | Freq: Every day | ORAL | Status: DC

## 2016-07-16 MED ORDER — LEVOFLOXACIN 750 MG PO TABS: 750.0000 mg | ORAL_TABLET | Freq: Every day | ORAL | 0 refills | Status: DC

## 2016-07-16 MED ORDER — ALLOPURINOL 300 MG PO TABS: 300.0000 mg | ORAL_TABLET | Freq: Every day | ORAL | Status: DC

## 2016-07-16 MED ORDER — CYCLOSPORINE 0.05 % OP EMUL
1.0000 [drp] | Freq: Two times a day (BID) | OPHTHALMIC | Status: DC
  Administered 2016-07-16 (×2): 1 [drp] via OPHTHALMIC
  Filled 2016-07-16 (×3): qty 1

## 2016-07-16 MED ORDER — IPRATROPIUM BROMIDE 0.02 % IN SOLN: 0.5000 mg | RESPIRATORY_TRACT | Status: DC | PRN

## 2016-07-16 NOTE — Progress Notes (Signed)
Patient was seen and examined. H&P reviewed. Admitted with worsening cough and shortness of breath-likely has asthmatic bronchitis. Seems to have rapidly improved overnight-lungs are clear on exam, and she is requesting discharge home. I will have the nursing staff ambulate the patient, and we will reassess in the afternoon to see if she is ready for discharge. If she feels that she is close to her baseline, then she will be discharged home at her own request.

## 2016-07-16 NOTE — Care Management Obs Status (Signed)
[place] NOTIFICATION   Patient Details  Name: April [name] MRN: [id number] Date of Birth: [birth year]   Medicare Observation Status Notification Given:  Yes    Bertin Inabinet, Rory Percy, RN 07/16/2016, 1:13 PM

## 2016-07-16 NOTE — Progress Notes (Signed)
New Admission Note:  Arrival Method: Via stretcher with nurse Tech Mental Orientation: Alert and oriented x4 Telemetry: box 22, CCMD notified Assessment: Completed Skin: dry and intact IV: right forearm  Pain: denies pain Tubes: n/a Safety Measures: Safety Fall Prevention Plan discussed. Admission: Completed [address] Orientation: Patient has been orientated to the room, unit and the staff. Family: none at bedside  Orders have been reviewed and implemented. Will continue to monitor the patient. Call light has been placed within reach.   Leandro Reasoner BSN, RN  Phone Number: [phone] [facility]

## 2016-07-16 NOTE — Progress Notes (Signed)
April [name] to be D/C'd Home per MD order.  Discussed prescriptions and follow up appointments with the patient. Prescriptions given to patient, medication list explained in detail. Pt verbalized understanding.    Medication List    TAKE these medications   albuterol 108 (90 Base) MCG/ACT inhaler Commonly known as:  PROVENTIL HFA;VENTOLIN HFA Inhale 2 puffs into the lungs every 4 (four) hours as needed for wheezing or shortness of breath. What changed:  when to take this   allopurinol 300 MG tablet Commonly known as:  ZYLOPRIM Take 300 mg by mouth daily.   aspirin EC 81 MG tablet Take 81 mg by mouth daily.   budesonide-formoterol 80-4.5 MCG/ACT inhaler Commonly known as:  SYMBICORT Inhale 2 puffs into the lungs 2 (two) times daily.   cycloSPORINE 0.05 % ophthalmic emulsion Commonly known as:  RESTASIS Place 1 drop into both eyes 2 (two) times daily.   fexofenadine 30 MG/5ML suspension Commonly known as:  ALLEGRA Take 30 mg by mouth daily as needed (for allergies).   FLUoxetine 40 MG capsule Commonly known as:  PROZAC Take 40 mg by mouth daily.   furosemide 40 MG tablet Commonly known as:  LASIX Take 1 tablet by mouth daily.   levofloxacin 750 MG tablet Commonly known as:  LEVAQUIN Take 1 tablet (750 mg total) by mouth daily.   magnesium oxide 400 MG tablet Commonly known as:  MAG-OX Take 400 mg by mouth 2 (two) times daily.   mycophenolate 180 MG EC tablet Commonly known as:  MYFORTIC Take 180 mg by mouth 2 (two) times daily.   pantoprazole 40 MG tablet Commonly known as:  PROTONIX Take 40 mg by mouth 2 (two) times daily.   predniSONE 5 MG tablet Commonly known as:  DELTASONE 40 mg (8 tablets) daily for 1 day, then, 30 mg (6 tablets) daily for 1 day, then, 20 mg (4 tablets) daily for 1 day, then, 10 mg (2 tablets) daily for 1 day, then, 7.5 mg daily-and stay on it What changed:  how much to take  how to take this  when to take this  additional  instructions   tacrolimus 0.5 MG capsule Commonly known as:  PROGRAF Take 0.5 mg by mouth every evening.   tacrolimus 1 MG capsule Commonly known as:  PROGRAF Take 1 mg by mouth every morning.   TRAVATAN Z 0.004 % Soln ophthalmic solution Generic drug:  Travoprost (BAK Free) Place 1 drop into both eyes at bedtime.   vitamin B-12 100 MCG tablet Commonly known as:  CYANOCOBALAMIN Take 100 mcg by mouth daily.       Vitals:   07/16/16 0531 07/16/16 0835  BP: (!) 123/54 129/67  Pulse: 86 79  Resp: 19 18  Temp: 97.5 F (36.4 C) 97.9 F (36.6 C)    Skin clean, dry and intact without evidence of skin break down, no evidence of skin tears noted. IV catheter discontinued intact. Site without signs and symptoms of complications. Dressing and pressure applied. Pt denies pain at this time. No complaints noted.  An After Visit Summary was printed and given to the patient. Patient escorted via [place], and D/C home via private auto.  Mohammed Kindle 07/16/2016 6:47 PM

## 2016-07-16 NOTE — Care Management CC44 (Signed)
Condition Code 44 Documentation Completed  Patient Details  Name: April [name] MRN: [id number] Date of Birth: 1956/02/11   Condition Code 44 given:   yes Patient signature on Condition Code 44 notice:   yes Documentation of 2 MD's agreement:   yes Code 44 added to claim:   yes    Adron Bene, RN 07/16/2016, 1:13 PM

## 2016-07-16 NOTE — Discharge Summary (Signed)
PATIENT DETAILS Name: April [name] Age: 60 y.o. Sex: female Date of Birth: Jan 09, 1956 MRN: [id number]. Admitting Physician: Reubin Milan, MD [id number], MD  Admit Date: 07/15/2016 Discharge date: 07/16/2016  Recommendations for Outpatient Follow-up:  1. Follow up with PCP in 1-2 weeks 2. Please obtain BMP/CBC in one week 3. Repeat 2 view chest x-ray in 3-4 weeks 4. Please follow up on the following pending results: Blood cultures till final  Admitted From:  Home  Disposition: [place]: No  Equipment/Devices: None  Discharge Condition: Stable  CODE STATUS: FULL CODE  Diet recommendation:  Heart Healthy  Brief Summary: See H&P, Labs, Consult and Test reports for all details in brief, patient is a 60 year old female with history of renal transplant and numerous immunosuppressants, bronchial asthma who presented with worsening cough and shortness of breath for 3-4 days prior to this admission.   Brief Hospital Course: Acute asthmatic bronchitis: Patient was started on intravenous Levaquin, IV steroids and bronchodilators via nebulizers. With these measures patient rapidly improved. This morning, she was feeling close to her usual baseline and was wanting to be discharged home. We continued to observe her during the day, she was ambulated in the hallway by the nurses without any worsening shortness of breath. She feels that she is back to her usual baseline, on exam she is moving air well without any rhonchi. I do not think she had a pneumonia, chest x-ray done on admission was most suggestive of atelectasis. In any event, I think she is stable to be discharged home today as she is back to her baseline. She will be discharged home on tapering prednisone, empirically levofloxacin for a few more days, she is to continue using her rescue inhaler and a maintenance inhaler as before. I have asked her to follow-up with her primary care M.D. in one week. PCP  to repeat chest x-ray in 2-3 weeks to make sure the atelectatic area on the right lower lobe has completely resolved.  Rest of her medical problems were stable during this hospital course.   Procedures/Studies: None  Discharge Diagnoses:  Principal Problem: Acute asthmatic bronchitis Active Problems:   GERD (gastroesophageal reflux disease)   Renal transplant recipient   Hyperlipidemia   Depression  Discharge Instructions:  Activity:  As tolerated  Discharge Instructions    Call MD for:  difficulty breathing, headache or visual disturbances    Complete by:  As directed   Call MD for:  temperature >100.4    Complete by:  As directed   Diet - low sodium heart healthy    Complete by:  As directed   Increase activity slowly    Complete by:  As directed       Medication List    TAKE these medications   albuterol 108 (90 Base) MCG/ACT inhaler Commonly known as:  PROVENTIL HFA;VENTOLIN HFA Inhale 2 puffs into the lungs every 4 (four) hours as needed for wheezing or shortness of breath. What changed:  when to take this   allopurinol 300 MG tablet Commonly known as:  ZYLOPRIM Take 300 mg by mouth daily.   aspirin EC 81 MG tablet Take 81 mg by mouth daily.   budesonide-formoterol 80-4.5 MCG/ACT inhaler Commonly known as:  SYMBICORT Inhale 2 puffs into the lungs 2 (two) times daily.   cycloSPORINE 0.05 % ophthalmic emulsion Commonly known as:  RESTASIS Place 1 drop into both eyes 2 (two) times daily.   fexofenadine 30 MG/5ML suspension Commonly known as:  ALLEGRA Take 30 mg by mouth daily as needed (for allergies).   FLUoxetine 40 MG capsule Commonly known as:  PROZAC Take 40 mg by mouth daily.   furosemide 40 MG tablet Commonly known as:  LASIX Take 1 tablet by mouth daily.   levofloxacin 750 MG tablet Commonly known as:  LEVAQUIN Take 1 tablet (750 mg total) by mouth daily.   magnesium oxide 400 MG tablet Commonly known as:  MAG-OX Take 400 mg by mouth 2  (two) times daily.   mycophenolate 180 MG EC tablet Commonly known as:  MYFORTIC Take 180 mg by mouth 2 (two) times daily.   pantoprazole 40 MG tablet Commonly known as:  PROTONIX Take 40 mg by mouth 2 (two) times daily.   predniSONE 5 MG tablet Commonly known as:  DELTASONE 40 mg (8 tablets) daily for 1 day, then, 30 mg (6 tablets) daily for 1 day, then, 20 mg (4 tablets) daily for 1 day, then, 10 mg (2 tablets) daily for 1 day, then, 7.5 mg daily-and stay on it What changed:  how much to take  how to take this  when to take this  additional instructions   tacrolimus 0.5 MG capsule Commonly known as:  PROGRAF Take 0.5 mg by mouth every evening.   tacrolimus 1 MG capsule Commonly known as:  PROGRAF Take 1 mg by mouth every morning.   TRAVATAN Z 0.004 % Soln ophthalmic solution Generic drug:  Travoprost (BAK Free) Place 1 drop into both eyes at bedtime.   vitamin B-12 100 MCG tablet Commonly known as:  CYANOCOBALAMIN Take 100 mcg by mouth daily.      Follow-up Information    MCNEILL,WENDY, MD. Schedule an appointment as soon as possible for a visit in 1 week(s).   Specialty:  Family Medicine Contact information: [place] [address] [phone]          Allergies  Allergen Reactions  . Infed [Iron Dextran] Other (See Comments)    Chest tightness  . Pentamidine Itching, Shortness Of Breath and Swelling  . Erythromycin [Erythromycin] Other (See Comments)    Mouth Ulcers  . Oxycodone Nausea Only and Nausea And Vomiting  . Erythromycin Rash    Causes breakout in mouth  . Ultram [Tramadol Hcl] Anxiety      Consultations:  None  Other Procedures/Studies: Dg Chest 2 View  Result Date: 07/15/2016 CLINICAL DATA:  Cough.  Chest pain and shortness of breath tonight. EXAM: CHEST  2 VIEW COMPARISON:  Chest radiographs earlier this day. Radiographs 07/02/2015 FINDINGS: Improved right basilar aeration from radiographs earlier this day.  Minimal residual streaky opacities. Left basilar scarring again seen. Cardiomegaly is unchanged. Interstitial opacities possibly representing pulmonary edema are unchanged. No new airspace disease, pleural effusion or pneumothorax. The bones appear under mineralized. IMPRESSION: 1. Improved right basilar aeration from exam earlier this day, favor atelectasis, less likely early pneumonia. Left basilar scarring. 2. Unchanged cardiomegaly and interstitial opacities, may reflect mild pulmonary edema. 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 Electronically Signed   By: Jeb Levering M.D.   On: 07/15/2016 21:35     TODAY-DAY OF DISCHARGE:  Subjective:   April [name] today has no headache,no chest abdominal pain,no new weakness tingling or numbness, feels much better wants to go home today.   Objective:   Blood pressure 129/67, pulse 79, temperature 97.9 F (36.6 C), temperature source Oral, resp. rate 18, height 5\' 1"  (1.549 m),  weight 88.7 kg (195 lb 8.8 oz), SpO2 95 %.  Intake/Output Summary (Last 24 hours) at 07/16/16 1428 Last data filed at 07/16/16 1100  Gross per 24 hour  Intake              480 ml  Output                0 ml  Net              480 ml   Filed Weights   07/16/16 0004  Weight: 88.7 kg (195 lb 8.8 oz)    Exam: Awake Alert, Oriented *3, No new F.N deficits, Normal affect [place].AT,PERRAL Supple Neck,No JVD, No cervical lymphadenopathy appriciated.  Symmetrical Chest wall movement, Good air movement bilaterally, CTAB RRR,No Gallops,Rubs or new Murmurs, No Parasternal Heave +ve B.Sounds, Abd Soft, Non tender, No organomegaly appriciated, No rebound -guarding or rigidity. No Cyanosis, Clubbing or edema, No new Rash or bruise   PERTINENT RADIOLOGIC STUDIES: Dg Chest 2 View  Result Date: 07/15/2016 CLINICAL DATA:  Cough.  Chest pain and shortness of breath tonight. EXAM: CHEST  2 VIEW COMPARISON:  Chest radiographs earlier  this day. Radiographs 07/02/2015 FINDINGS: Improved right basilar aeration from radiographs earlier this day. Minimal residual streaky opacities. Left basilar scarring again seen. Cardiomegaly is unchanged. Interstitial opacities possibly representing pulmonary edema are unchanged. No new airspace disease, pleural effusion or pneumothorax. The bones appear under mineralized. IMPRESSION: 1. Improved right basilar aeration from exam earlier this day, favor atelectasis, less likely early pneumonia. Left basilar scarring. 2. Unchanged cardiomegaly and interstitial opacities, may reflect mild pulmonary edema. 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 Electronically Signed   By: Jeb Levering M.D.   On: 07/15/2016 21:35     PERTINENT LAB RESULTS: CBC:  Recent Labs  07/15/16 1723 07/16/16 0707  WBC 10.5 7.5  HGB 13.5 12.7  HCT 44.9 42.5  PLT 207 177   CMET CMP     Component Value Date/Time   NA 134 (L) 07/16/2016 0707   K 4.3 07/16/2016 0707   CL 102 07/16/2016 0707   CO2 24 07/16/2016 0707   GLUCOSE 258 (H) 07/16/2016 0707   BUN 32 (H) 07/16/2016 0707   CREATININE 1.30 (H) 07/16/2016 0707   CALCIUM 9.8 07/16/2016 0707   CALCIUM 10.8 (H) 10/13/2011 1405   PROT 7.1 07/16/2016 0707   ALBUMIN 2.9 (L) 07/16/2016 0707   AST 27 07/16/2016 0707   ALT 30 07/16/2016 0707   ALKPHOS 103 07/16/2016 0707   BILITOT 0.9 07/16/2016 0707   GFRNONAA 44 (L) 07/16/2016 0707   GFRAA 51 (L) 07/16/2016 0707    GFR Estimated Creatinine Clearance: 47.2 mL/min (by C-G formula based on SCr of 1.3 mg/dL). No results for input(s): LIPASE, AMYLASE in the last 72 hours. No results for input(s): CKTOTAL, CKMB, CKMBINDEX, TROPONINI in the last 72 hours. Invalid input(s): POCBNP No results for input(s): DDIMER in the last 72 hours. No results for input(s): HGBA1C in the last 72 hours. No results for input(s): CHOL, HDL, LDLCALC, TRIG, CHOLHDL, LDLDIRECT in the  last 72 hours. No results for input(s): TSH, T4TOTAL, T3FREE, THYROIDAB in the last 72 hours.  Invalid input(s): FREET3 No results for input(s): VITAMINB12, FOLATE, FERRITIN, TIBC, IRON, RETICCTPCT in the last 72 hours. Coags: No results for input(s): INR in the last 72 hours.  Invalid input(s): PT Microbiology: No results found for this or any previous visit (from the past  240 hour(s)).  FURTHER DISCHARGE INSTRUCTIONS:  Get Medicines reviewed and adjusted: Please take all your medications with you for your next visit with your Primary MD  Laboratory/radiological data: Please request your Primary MD to go over all hospital tests and procedure/radiological results at the follow up, please ask your Primary MD to get all Hospital records sent to his/her office.  In some cases, they will be blood work, cultures and biopsy results pending at the time of your discharge. Please request that your primary care M.D. goes through all the records of your hospital data and follows up on these results.  Also Note the following: If you experience worsening of your admission symptoms, develop shortness of breath, life threatening emergency, suicidal or homicidal thoughts you must seek medical attention immediately by calling 911 or calling your MD immediately  if symptoms less severe.  You must read complete instructions/literature along with all the possible adverse reactions/side effects for all the Medicines you take and that have been prescribed to you. Take any new Medicines after you have completely understood and accpet all the possible adverse reactions/side effects.   Do not drive when taking Pain medications or sleeping medications (Benzodaizepines)  Do not take more than prescribed Pain, Sleep and Anxiety Medications. It is not advisable to combine anxiety,sleep and pain medications without talking with your primary care practitioner  Special Instructions: If you have smoked or chewed  Tobacco  in the last 2 yrs please stop smoking, stop any regular Alcohol  and or any Recreational drug use.  Wear Seat belts while driving.  Please note: You were cared for by a hospitalist during your hospital stay. Once you are discharged, your primary care physician will handle any further medical issues. Please note that NO REFILLS for any discharge medications will be authorized once you are discharged, as it is imperative that you return to your primary care physician (or establish a relationship with a primary care physician if you do not have one) for your post hospital discharge needs so that they can reassess your need for medications and monitor your lab values.  Total Time spent coordinating discharge including counseling, education and face to face time equals 35 minutes.  SignedOren Binet 07/16/2016 2:28 PM

## 2016-07-22 LAB — CULTURE, BLOOD (ROUTINE X 2)
CULTURE: NO GROWTH
Culture: NO GROWTH

## 2016-08-17 IMAGING — US US RENAL
1 series · 14 of 25 positions shown · non-contrast
Comparison: CT 05/15/2013

CLINICAL DATA: Right-sided flank pain. Right-sided renal
transplant. History of polycystic kidney disease.

EXAM:
RENAL/URINARY TRACT ULTRASOUND COMPLETE

[Series 1: us renal · 0.37mm/px · 14 of 58 slices shown]
[im 1/58]
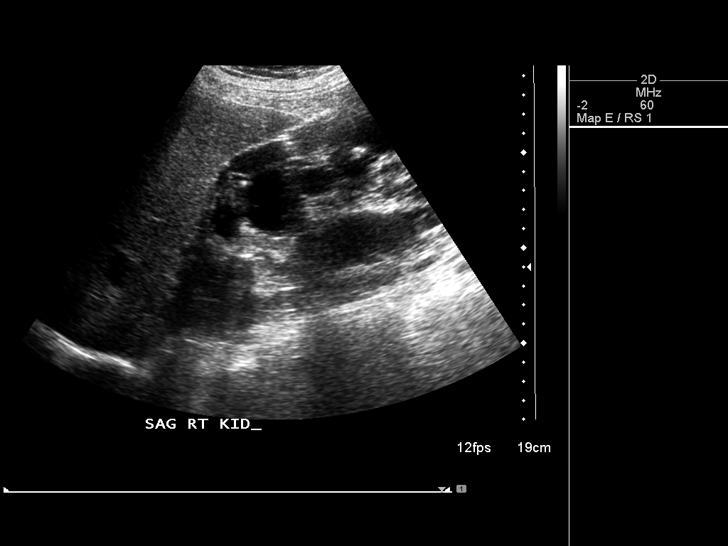
[im 5/58]
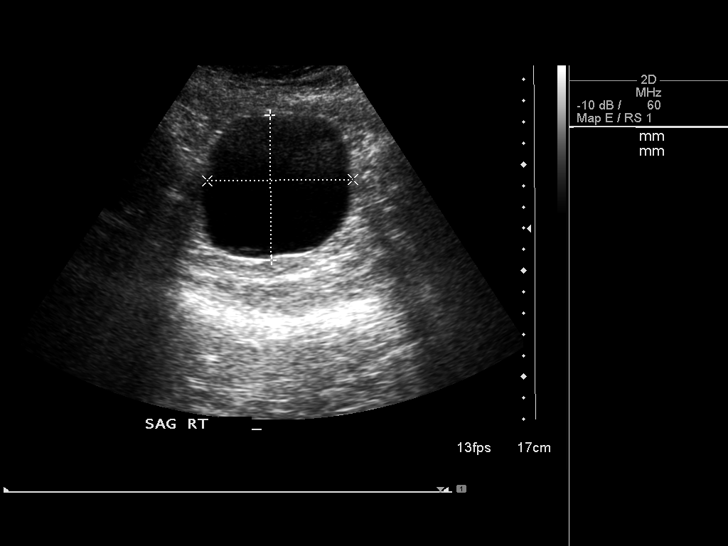
[im 10/58]
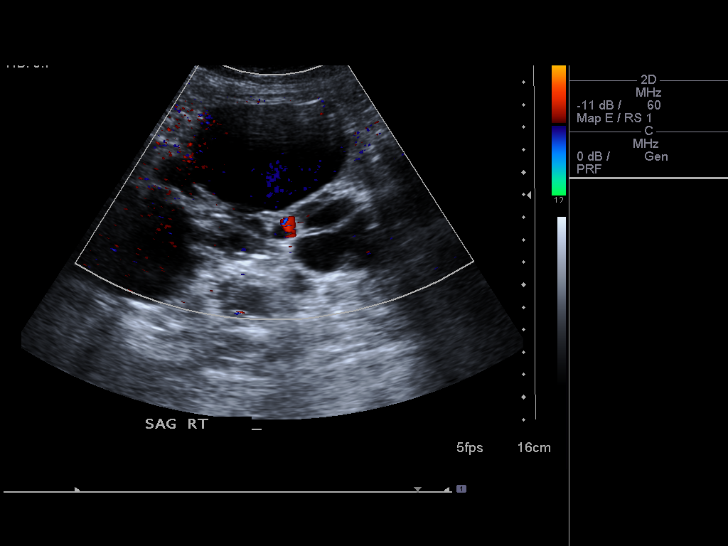
[im 15/58]
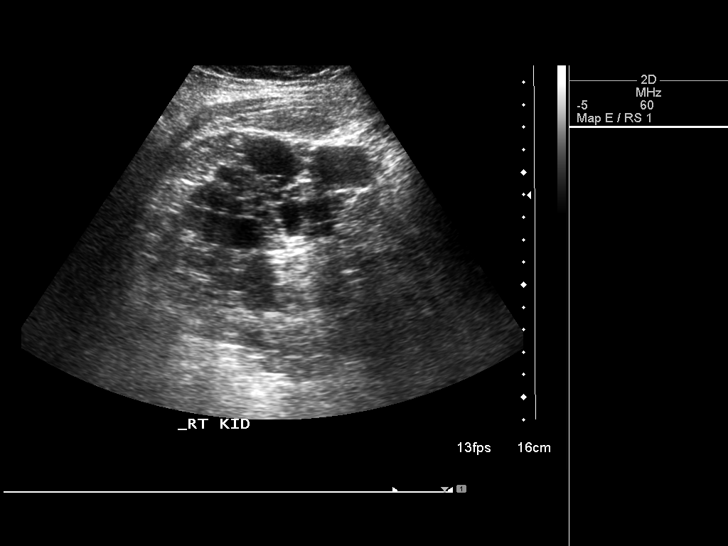
[im 20/58]
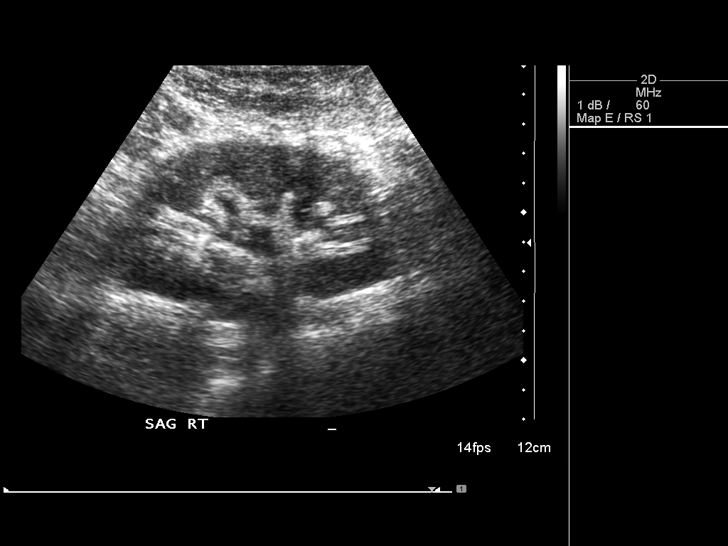
[im 22/58]
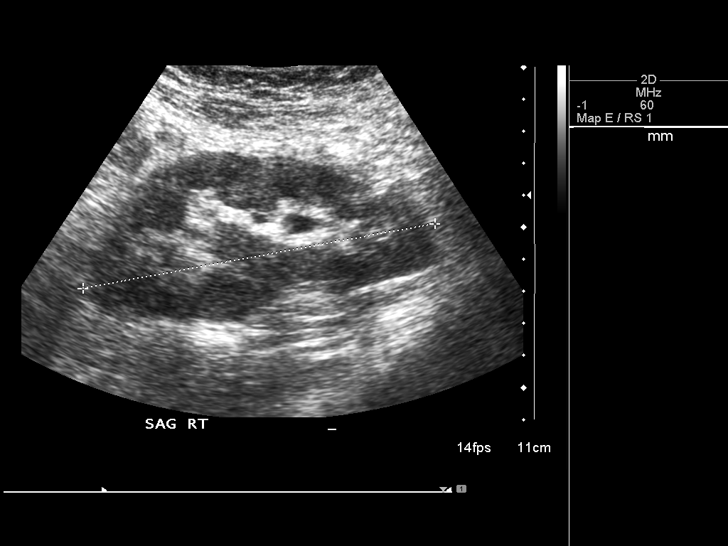
[im 27/58]
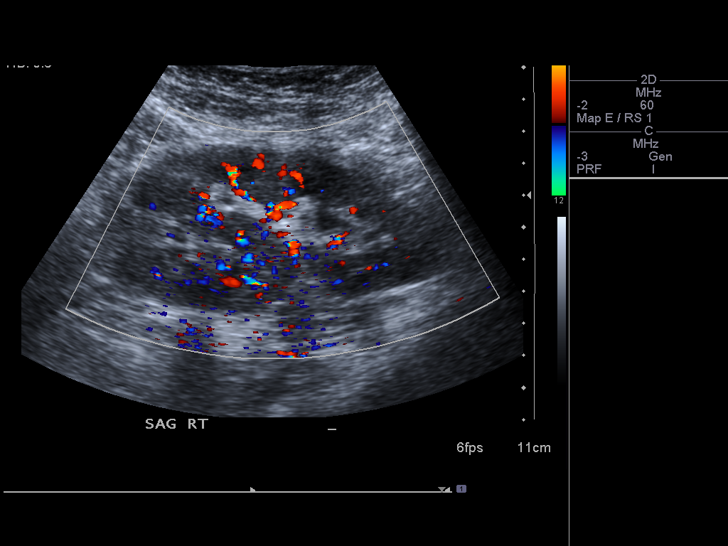
[im 31/58]
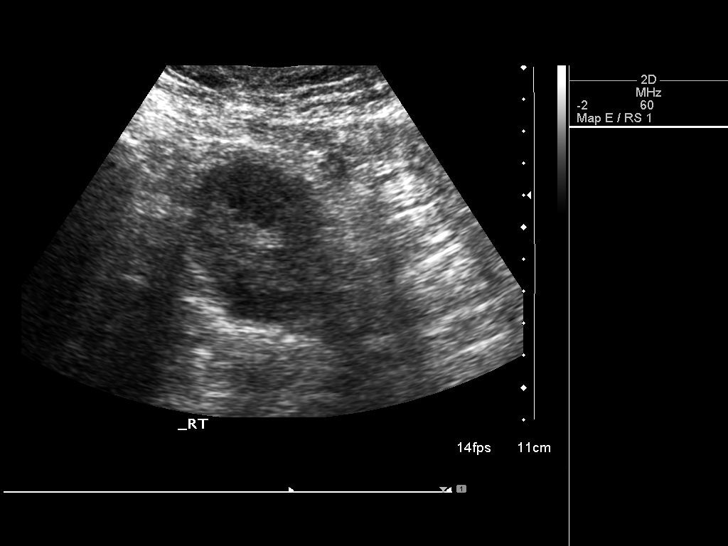
[im 36/58]
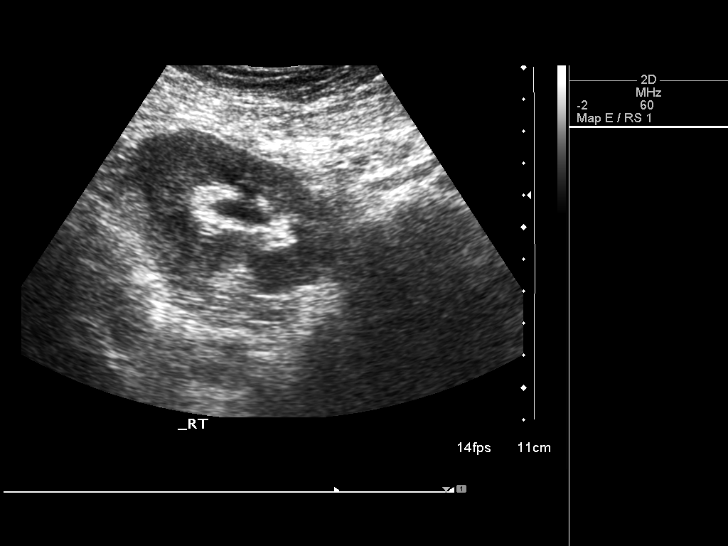
[im 39/58]
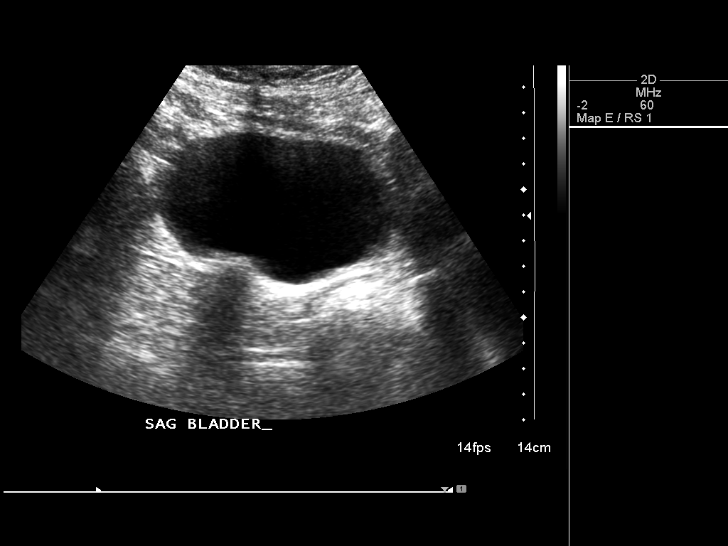
[im 43/58]
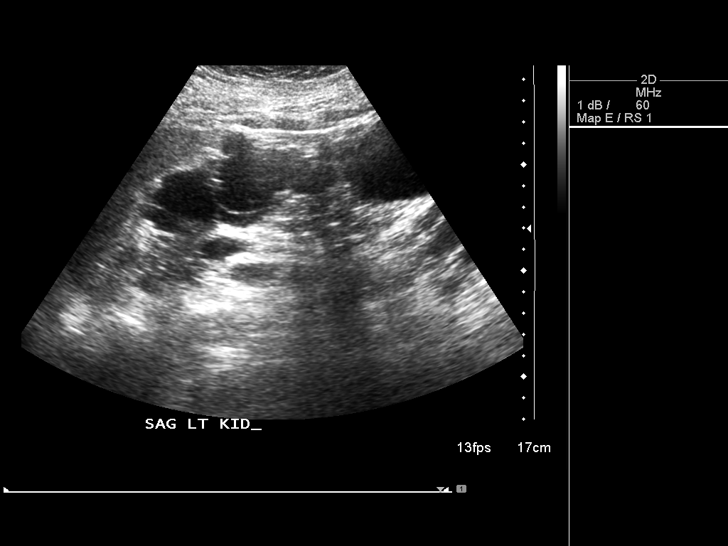
[im 48/58]
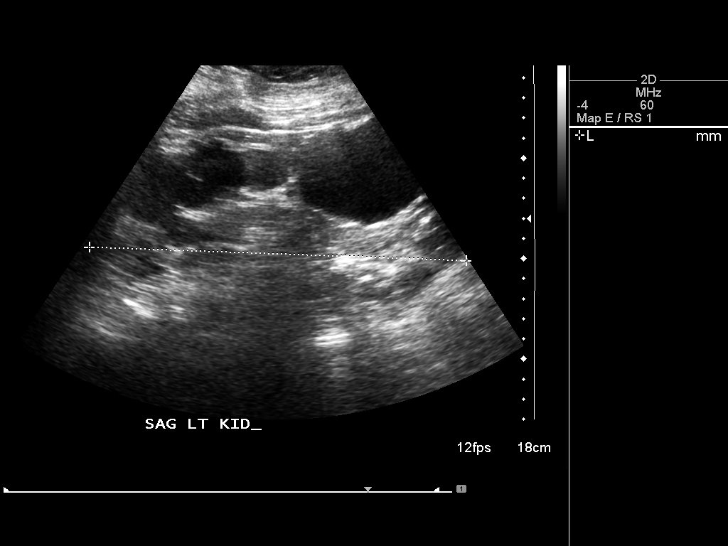
[im 53/58]
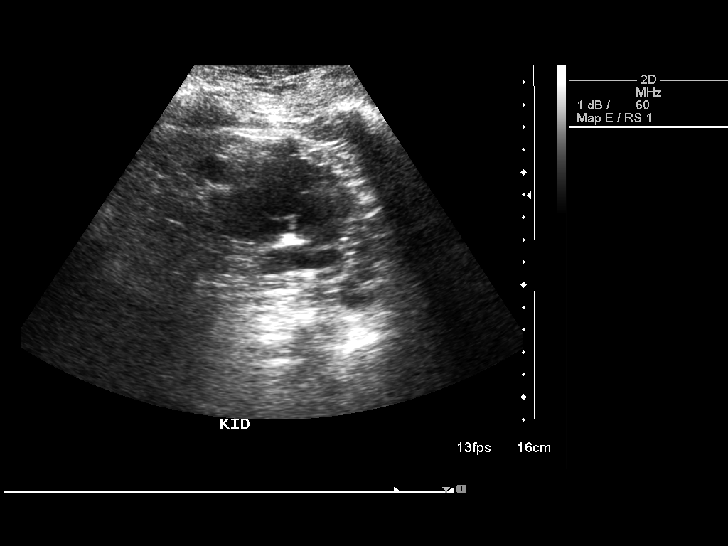
[im 58/58]
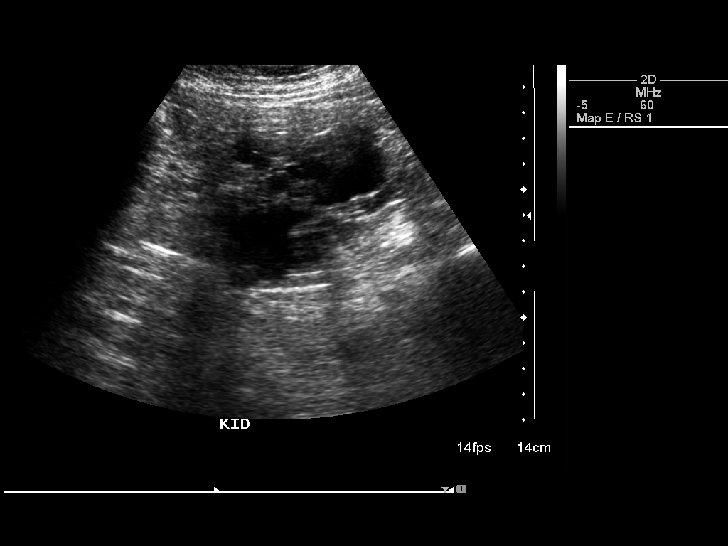

[14 of 25 positions shown; findings below may reference images not displayed]

FINDINGS: Right Kidney:

Length: 18.5 cm, enlarged. Multiple cystic lesions identified,
consistent with the clinical history.. No hydronephrosis.

Left Kidney:

Length: 18.8 cm, enlarged.. Multiple cystic lesions identified,
consistent with the clinical history. No hydronephrosis.

Right-sided renal transplant measures 11.2 cm. No peritransplant
fluid collection. Normal color Doppler signal. Minimal transplant
caliectasis could be physiologic. Example image 30.

Bladder:

Within normal limits.
IMPRESSION: 1. Enlarged bilateral native kidneys with multiple cystic lesions
within. Most consistent with the clinical history of polycystic
kidney disease. No superimposed hydronephrosis.
2. Right renal transplant. Minimal caliectasis which is
indeterminate. This could be physiologic. No peritransplant fluid
collection or other acute complication.

## 2016-08-26 ENCOUNTER — Other Ambulatory Visit (INDEPENDENT_AMBULATORY_CARE_PROVIDER_SITE_OTHER): Payer: Medicare Other

## 2016-08-26 ENCOUNTER — Ambulatory Visit (INDEPENDENT_AMBULATORY_CARE_PROVIDER_SITE_OTHER): Payer: Medicare Other | Admitting: Pulmonary Disease

## 2016-08-26 ENCOUNTER — Encounter: Payer: Self-pay | Admitting: Pulmonary Disease

## 2016-08-26 VITALS — BP 134/66 | HR 81 | Ht 61.5 in | Wt 196.2 lb

## 2016-08-26 DIAGNOSIS — R053 Chronic cough: Secondary | ICD-10-CM

## 2016-08-26 DIAGNOSIS — R05 Cough: Secondary | ICD-10-CM | POA: Diagnosis not present

## 2016-08-26 LAB — CBC WITH DIFFERENTIAL/PLATELET
BASOS PCT: 0.2 % (ref 0.0–3.0)
Basophils Absolute: 0 10*3/uL (ref 0.0–0.1)
EOS ABS: 0.2 10*3/uL (ref 0.0–0.7)
Eosinophils Relative: 1.6 % (ref 0.0–5.0)
HCT: 43.4 % (ref 36.0–46.0)
Hemoglobin: 14 g/dL (ref 12.0–15.0)
LYMPHS ABS: 1.2 10*3/uL (ref 0.7–4.0)
Lymphocytes Relative: 10.8 % — ABNORMAL LOW (ref 12.0–46.0)
MCHC: 32.3 g/dL (ref 30.0–36.0)
MCV: 89.6 fl (ref 78.0–100.0)
MONO ABS: 0.5 10*3/uL (ref 0.1–1.0)
Monocytes Relative: 4.2 % (ref 3.0–12.0)
NEUTROS ABS: 9.1 10*3/uL — AB (ref 1.4–7.7)
Neutrophils Relative %: 83.2 % — ABNORMAL HIGH (ref 43.0–77.0)
PLATELETS: 214 10*3/uL (ref 150.0–400.0)
RBC: 4.84 Mil/uL (ref 3.87–5.11)
RDW: 19.7 % — AB (ref 11.5–15.5)
WBC: 11 10*3/uL — ABNORMAL HIGH (ref 4.0–10.5)

## 2016-08-26 LAB — NITRIC OXIDE: Nitric Oxide: 13

## 2016-08-26 MED ORDER — AZELASTINE-FLUTICASONE 137-50 MCG/ACT NA SUSP: 1.0000 | Freq: Two times a day (BID) | NASAL | 5 refills | Status: DC

## 2016-08-26 MED ORDER — AZELASTINE-FLUTICASONE 137-50 MCG/ACT NA SUSP: 1.0000 | Freq: Two times a day (BID) | NASAL | 0 refills | Status: DC

## 2016-08-26 NOTE — Patient Instructions (Addendum)
Continue using Symbicort 160/4.5. We will schedule you for pulmonary function tests We'll check an FENO, CBC with differential and a blood allergy profile Continue using the Allegra. Will switch to flonase to dymista. Continue using the Protonix for acid reflux.  Return to clinic in 2 months.

## 2016-08-26 NOTE — Progress Notes (Signed)
April [name]    [id number]    [birth year]  Primary Care Physician:MCNEILL,WENDY, MD  Referring Physician: [name], MD [place], [place] [address]  Chief complaint:   Follow-up for dyspnea.  HPI: April [name] is a 60 year old who was evaluated in pulmonary clinic in 2015 for dyspnea by Dr. Gwenette Greet. She had a high-resolution CT of the chest at that time which was unremarkable. Her PFTs showed minimal obstructive lung disease. She was hospitalized in July 15, 2016 for acute asthmatic bronchitis. There was a question of left lower lobe pneumonia with a basal opacity. However this resolved on subsequent chest x-ray on the same day. The opacity was thought to be atelectasis. She was treated with IV Levaquin, IV steroids, bronchodilators and discharged on a prednisone taper. She was later seen in her primary care office and put on Symbicort 160/4.5.  She reports some improvement in symptoms since then. She uses her albuterol inhaler 1-2 times daily. She still has some cough with wheezing, sputum production. She has issues with rhinitis, postnasal drip but is just using the pediatric dose of Allegra as the full dose makes her very sleepy. She is also on Protonix for acid reflux. She has history of renal transplant for polycystic kidney disease and is chronically immune suppressed on mycophenolate and prednisone at 7.5 mg  Outpatient Encounter Prescriptions as of 08/26/2016  Medication Sig  . albuterol (PROVENTIL HFA;VENTOLIN HFA) 108 (90 Base) MCG/ACT inhaler Inhale 2 puffs into the lungs every 4 (four) hours as needed for wheezing or shortness of breath.  . allopurinol (ZYLOPRIM) 300 MG tablet Take 300 mg by mouth daily.  [name] aspirin EC 81 MG tablet Take 81 mg by mouth daily.  . budesonide-formoterol (SYMBICORT) 160-4.5 MCG/ACT inhaler Inhale 2 puffs into the lungs 2 (two) times daily.  . cycloSPORINE (RESTASIS) 0.05 % ophthalmic emulsion Place 1 drop into both  eyes 2 (two) times daily.  . fexofenadine (ALLEGRA) 30 MG/5ML suspension Take 30 mg by mouth daily as needed (for allergies).   [name] FLUoxetine (PROZAC) 40 MG capsule Take 40 mg by mouth daily.  . furosemide (LASIX) 40 MG tablet Take 1 tablet by mouth daily.  . magnesium oxide (MAG-OX) 400 MG tablet Take 400 mg by mouth 2 (two) times daily.  . mycophenolate (MYFORTIC) 180 MG EC tablet Take 180 mg by mouth 2 (two) times daily.  . pantoprazole (PROTONIX) 40 MG tablet Take 40 mg by mouth 2 (two) times daily.   . predniSONE (DELTASONE) 5 MG tablet 40 mg (8 tablets) daily for 1 day, then, 30 mg (6 tablets) daily for 1 day, then, 20 mg (4 tablets) daily for 1 day, then, 10 mg (2 tablets) daily for 1 day, then, 7.5 mg daily-and stay on it  . tacrolimus (PROGRAF) 0.5 MG capsule Take 0.5 mg by mouth every evening.  . tacrolimus (PROGRAF) 1 MG capsule Take 1 mg by mouth every morning.   . TRAVATAN Z 0.004 % SOLN ophthalmic solution Place 1 drop into both eyes at bedtime.  . vitamin B-12 (CYANOCOBALAMIN) 100 MCG tablet Take 100 mcg by mouth daily.  [name] HYDROmorphone (DILAUDID) 2 MG tablet Take 2 mg by mouth every 4 (four) hours as needed.  . [DISCONTINUED] budesonide-formoterol (SYMBICORT) 80-4.5 MCG/ACT inhaler Inhale 2 puffs into the lungs 2 (two) times daily.  . [DISCONTINUED] levofloxacin (LEVAQUIN) 750 MG tablet Take 1 tablet (750 mg total) by mouth daily. (Patient not taking: Reported on 08/26/2016)  No facility-administered encounter medications on file as of 08/26/2016.     Allergies as of 08/26/2016 - Review Complete 08/26/2016  Allergen Reaction Noted  . Infed [iron dextran] Other (See Comments) 02/26/2012  . Pentamidine Itching, Shortness Of Breath, and Swelling 07/24/2014  . Erythromycin [erythromycin] Other (See Comments) 02/26/2012  . Oxycodone Nausea Only and Nausea And Vomiting 07/24/2014  . Erythromycin Rash 07/24/2014  . Ultram [tramadol hcl] Anxiety 02/26/2012    Past Medical  History:  Diagnosis Date  . Arthritis   . Asthma   . Depression   . GERD (gastroesophageal reflux disease)   . Hyperlipidemia   . Hyperparathyroidism   . Polycystic kidney   . PONV (postoperative nausea and vomiting)     Past Surgical History:  Procedure Laterality Date  . 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 BILATERAL OOPHORECTOMY    . CARPAL TUNNEL RELEASE  2000  . CESAREAN SECTION    . INSERTION OF DIALYSIS CATHETER  03/31/2012   Procedure: INSERTION OF DIALYSIS CATHETER;  Surgeon: Angelia Mould, MD;  Location: [facility];  Service: Vascular;  Laterality: N/A;  insertion of dialysis catheter right internal jugular vein  . 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 TONSILLECTOMY  1967  . TUBAL LIGATION  2010    Family History  Problem Relation Age of Onset  . Hypertension Mother   . Heart disease Father     CABG history  . Polycystic kidney disease Father   . Polycystic kidney disease Brother     Social History   Social History  . Marital status: Married    Spouse name: N/A  . Number of children: N/A  . Years of education: N/A   Occupational History  . retired    Social History Main Topics  . Smoking status: Former Smoker    Packs/day: 0.25    Years: 15.00    Types: Cigarettes    Quit date: 03/22/1998  . Smokeless tobacco: Never Used  . Alcohol use No     Comment: social  . Drug use: No  . Sexual activity: Not on file   Other Topics Concern  . Not on file   Social History Narrative   Married, lives with spouse, son and mother   2 children > son and daughter   OCCUPATION: retired Education officer, [employer]     Review of systems: Review of Systems  Constitutional: Negative for fever and chills.  HENT: Negative.   Eyes: Negative for blurred vision.  Respiratory: as per HPI  Cardiovascular: Negative for chest pain and palpitations.  Gastrointestinal: Negative for vomiting, diarrhea, blood per rectum. Genitourinary: Negative for dysuria,  urgency, frequency and hematuria.  Musculoskeletal: Negative for myalgias, back pain and joint pain.  Skin: Negative for itching and rash.  Neurological: Negative for dizziness, tremors, focal weakness, seizures and loss of consciousness.  Endo/Heme/Allergies: Negative for environmental allergies.  Psychiatric/Behavioral: Negative for depression, suicidal ideas and hallucinations.  All other systems reviewed and are negative.   Physical Exam: Blood pressure 134/66, pulse 81, height 5' 1.5" (1.562 m), weight 196 lb 3.2 oz (89 kg), SpO2 93 %. Gen:      No acute distress HEENT:  EOMI, sclera anicteric Neck:     No masses; no thyromegaly Lungs:    Clear to auscultation bilaterally; normal respiratory effort CV:         Regular rate and rhythm; no murmurs Abd:      + bowel sounds; soft,  non-tender; no palpable masses, no distension Ext:    No edema; adequate peripheral perfusion Skin:      Warm and dry; no rash Neuro: alert and oriented x 3 Psych: normal mood and affect  Data Reviewed: PFTs 08/08/14 FVC 1.66 [52%]  FEV1 1.35 (57%] F/F 81 RV/TLC elevated. Mild obstructive small airway disease, hyperinflation, air trapping.  CT high res 07/27/14 No ILD. Images reviewed.  Chest x-ray 07/15/16 Improved right basilar infiltrate. Images reviewed.  Assessment:  Evaluation for dyspnea.  Her symptoms may be from asthmatic bronchitis. Her PFTs and CT scan from 2015 were reviewed. She does not have any evidence of interstitial lung disease but has very minimal small airways obstruction. I'll reevaluate by sending another set of lung function tests. She seems to be improving slowly on Symbicort at 160/4.5 and will continue the same. I'll also evaluate with an FENO, CBC with differential and blood allergy profile.  I suspect her postnasal drip is contributing to her symptoms. She would not tolerate first-generation antihistamine due to somnolence. I'll continue the pediatric dose of Allegra and  start her on dymista nasal spray. She'll also continue her Protonix for acid reflux.  Plan/Recommendations: - Continue Symbicort - PFTs - Check FENO, CBC with diff, blood allergy profile - Continue allegra, start dymista. - Continue protonix for acid reflux.   Marshell Garfinkel MD [facility] Pager [phone] 08/26/2016, 11:52 AM  CC: [name], MD

## 2016-08-27 LAB — RESPIRATORY ALLERGY PROFILE REGION II ~~LOC~~
Allergen, C. Herbarum, M2: <0.1 kU/L
Allergen, Comm Silver Birch, t9: <0.1 kU/L
Allergen, Cottonwood, t14: <0.1 kU/L
Allergen, Mouse Urine Protein, e78: <0.1 kU/L
Allergen, Mulberry, t76: <0.1 kU/L
Allergen, Oak,t7: <0.1 kU/L
Aspergillus fumigatus, m3: <0.1 kU/L
Bermuda Grass: <0.1 kU/L
Cat Dander: <0.1 kU/L
Cockroach: <0.1 kU/L
Dog Dander: <0.1 kU/L
Elm IgE: <0.1 kU/L
IgE (Immunoglobulin E), Serum: 4 kU/L (ref <115)

## 2016-11-05 ENCOUNTER — Ambulatory Visit (INDEPENDENT_AMBULATORY_CARE_PROVIDER_SITE_OTHER): Payer: Medicare Other | Admitting: Pulmonary Disease

## 2016-11-05 ENCOUNTER — Encounter: Payer: Self-pay | Admitting: Pulmonary Disease

## 2016-11-05 VITALS — BP 120/60 | HR 88 | Ht 61.5 in | Wt 194.0 lb

## 2016-11-05 DIAGNOSIS — R05 Cough: Secondary | ICD-10-CM | POA: Diagnosis not present

## 2016-11-05 DIAGNOSIS — R053 Chronic cough: Secondary | ICD-10-CM

## 2016-11-05 LAB — PULMONARY FUNCTION TEST
DL/VA % PRED: 103 %
DL/VA: 4.57 ml/min/mmHg/L
DLCO cor % pred: 65 %
DLCO cor: 13.28 ml/min/mmHg
DLCO unc % pred: 71 %
DLCO unc: 14.39 ml/min/mmHg
FEF 25-75 PRE: 1.03 L/s
FEF 25-75 Post: 1.58 L/sec
FEF2575-%CHANGE-POST: 52 %
FEF2575-%PRED-POST: 71 %
FEF2575-%Pred-Pre: 47 %
FEV1-%CHANGE-POST: 10 %
FEV1-%PRED-PRE: 47 %
FEV1-%Pred-Post: 52 %
FEV1-PRE: 1.09 L
FEV1-Post: 1.2 L
FEV1FVC-%Change-Post: 4 %
FEV1FVC-%Pred-Pre: 104 %
FEV6-%Change-Post: 6 %
FEV6-%PRED-POST: 49 %
FEV6-%Pred-Pre: 46 %
FEV6-PRE: 1.33 L
FEV6-Post: 1.41 L
FEV6FVC-%PRED-PRE: 103 %
FEV6FVC-%Pred-Post: 103 %
FVC-%CHANGE-POST: 6 %
FVC-%PRED-POST: 47 %
FVC-%PRED-PRE: 45 %
FVC-POST: 1.41 L
FVC-PRE: 1.33 L
POST FEV1/FVC RATIO: 85 %
PRE FEV6/FVC RATIO: 100 %
Post FEV6/FVC ratio: 100 %
Pre FEV1/FVC ratio: 82 %
RV % PRED: 85 %
RV: 1.56 L
TLC % pred: 68 %
TLC: 3.13 L

## 2016-11-05 NOTE — Patient Instructions (Signed)
Continue using the Symbicort. Continue using Allegra. Stop using the dymista. We will substitute this with Flonase nasal spray.  Return to clinic in 6 months

## 2016-11-05 NOTE — Progress Notes (Signed)
April [name]    [id number]    1956/04/07  Primary Care Physician:MCNEILL,WENDY, MD  Referring Physician: [name], MD [place], [place] [address]  Chief complaint:   Mild persistent asthma  HPI: April [name] is a 60 year old who was evaluated in pulmonary clinic in 2015 for dyspnea by Dr. Gwenette Greet. She had a high-resolution CT of the chest at that time which was unremarkable. Her PFTs showed minimal obstructive lung disease. She was hospitalized in July 15, 2016 for acute asthmatic bronchitis. There was a question of left lower lobe pneumonia with a basal opacity. However this resolved on subsequent chest x-ray on the same day. The opacity was thought to be atelectasis. She was treated with IV Levaquin, IV steroids, bronchodilators and discharged on a prednisone taper. She was later seen in her primary care office and put on Symbicort 160/4.5.  She reports some improvement in symptoms since then. She uses her albuterol inhaler 1-2 times daily. She still has some cough with wheezing, sputum production. She has issues with rhinitis, postnasal drip but is just using the pediatric dose of Allegra as the full dose makes her very sleepy. She is also on Protonix for acid reflux. She has history of renal transplant for polycystic kidney disease and is chronically immune suppressed on mycophenolate and prednisone at 7.5 mg.  Interim History: She has been put on Symbicort with improvement in symptoms. She still has occasional dyspnea with chest tightness. She uses her albuterol inhaler several times a week. She does not have any nocturnal awakenings.  Outpatient Encounter Prescriptions as of 11/05/2016  Medication Sig  . albuterol (PROVENTIL HFA;VENTOLIN HFA) 108 (90 Base) MCG/ACT inhaler Inhale 2 puffs into the lungs every 4 (four) hours as needed for wheezing or shortness of breath.  . allopurinol (ZYLOPRIM) 300 MG tablet Take 300 mg by mouth daily.  [name] aspirin  EC 81 MG tablet Take 81 mg by mouth daily.  . Azelastine-Fluticasone (DYMISTA) 137-50 MCG/ACT SUSP Place 1 spray into the nose 2 (two) times daily.  . budesonide-formoterol (SYMBICORT) 160-4.5 MCG/ACT inhaler Inhale 2 puffs into the lungs 2 (two) times daily.  . cycloSPORINE (RESTASIS) 0.05 % ophthalmic emulsion Place 1 drop into both eyes 2 (two) times daily.  . fexofenadine (ALLEGRA) 30 MG/5ML suspension Take 30 mg by mouth daily as needed (for allergies).   [name] FLUoxetine (PROZAC) 40 MG capsule Take 40 mg by mouth daily.  . furosemide (LASIX) 40 MG tablet Take 1 tablet by mouth daily.  [name] HYDROmorphone (DILAUDID) 2 MG tablet Take 2 mg by mouth every 4 (four) hours as needed.  . magnesium oxide (MAG-OX) 400 MG tablet Take 400 mg by mouth 2 (two) times daily.  . mycophenolate (MYFORTIC) 180 MG EC tablet Take 180 mg by mouth 2 (two) times daily.  . pantoprazole (PROTONIX) 40 MG tablet Take 40 mg by mouth 2 (two) times daily.   . predniSONE (DELTASONE) 5 MG tablet 40 mg (8 tablets) daily for 1 day, then, 30 mg (6 tablets) daily for 1 day, then, 20 mg (4 tablets) daily for 1 day, then, 10 mg (2 tablets) daily for 1 day, then, 7.5 mg daily-and stay on it  . tacrolimus (PROGRAF) 0.5 MG capsule Take 0.5 mg by mouth every evening.  . tacrolimus (PROGRAF) 1 MG capsule Take 1 mg by mouth every morning.   . TRAVATAN Z 0.004 % SOLN ophthalmic solution Place 1 drop into both eyes at bedtime.  [name]  vitamin B-12 (CYANOCOBALAMIN) 100 MCG tablet Take 100 mcg by mouth daily.  . Azelastine-Fluticasone (DYMISTA) 137-50 MCG/ACT SUSP Place 1 spray into the nose 2 (two) times daily.   No facility-administered encounter medications on file as of 11/05/2016.     Allergies as of 11/05/2016 - Review Complete 11/05/2016  Allergen Reaction Noted  . Infed [iron dextran] Other (See Comments) 02/26/2012  . Pentamidine Itching, Shortness Of Breath, and Swelling 07/24/2014  . Erythromycin [erythromycin] Other (See Comments)  02/26/2012  . Oxycodone Nausea Only and Nausea And Vomiting 07/24/2014  . Erythromycin Rash 07/24/2014  . Ultram [tramadol hcl] Anxiety 02/26/2012    Past Medical History:  Diagnosis Date  . Arthritis   . Asthma   . Depression   . GERD (gastroesophageal reflux disease)   . Hyperlipidemia   . Hyperparathyroidism   . Polycystic kidney   . PONV (postoperative nausea and vomiting)     Past Surgical History:  Procedure Laterality Date  . 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 BILATERAL OOPHORECTOMY    . CARPAL TUNNEL RELEASE  2000  . CESAREAN SECTION    . INSERTION OF DIALYSIS CATHETER  03/31/2012   Procedure: INSERTION OF DIALYSIS CATHETER;  Surgeon: Angelia Mould, MD;  Location: [facility];  Service: Vascular;  Laterality: N/A;  insertion of dialysis catheter right internal jugular vein  . 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 TONSILLECTOMY  1967  . TUBAL LIGATION  2010    Family History  Problem Relation Age of Onset  . Hypertension Mother   . Heart disease Father     CABG history  . Polycystic kidney disease Father   . Polycystic kidney disease Brother     Social History   Social History  . Marital status: Married    Spouse name: N/A  . Number of children: N/A  . Years of education: N/A   Occupational History  . retired    Social History Main Topics  . Smoking status: Former Smoker    Packs/day: 0.25    Years: 15.00    Types: Cigarettes    Quit date: 03/22/1998  . Smokeless tobacco: Never Used  . Alcohol use No     Comment: social  . Drug use: No  . Sexual activity: Not on file   Other Topics Concern  . Not on file   Social History Narrative   Married, lives with spouse, son and mother   2 children > son and daughter   OCCUPATION: retired Education officer, [employer]     Review of systems: Review of Systems  Constitutional: Negative for fever and chills.  HENT: Negative.   Eyes: Negative for blurred vision.  Respiratory: as per HPI   Cardiovascular: Negative for chest pain and palpitations.  Gastrointestinal: Negative for vomiting, diarrhea, blood per rectum. Genitourinary: Negative for dysuria, urgency, frequency and hematuria.  Musculoskeletal: Negative for myalgias, back pain and joint pain.  Skin: Negative for itching and rash.  Neurological: Negative for dizziness, tremors, focal weakness, seizures and loss of consciousness.  Endo/Heme/Allergies: Negative for environmental allergies.  Psychiatric/Behavioral: Negative for depression, suicidal ideas and hallucinations.  All other systems reviewed and are negative.   Physical Exam: Blood pressure 134/66, pulse 81, height 5' 1.5" (1.562 m), weight 196 lb 3.2 oz (89 kg), SpO2 93 %. Gen:      No acute distress HEENT:  EOMI, sclera anicteric Neck:     No masses; no thyromegaly Lungs:    Clear  to auscultation bilaterally; normal respiratory effort CV:         Regular rate and rhythm; no murmurs Abd:      + bowel sounds; soft, non-tender; no palpable masses, no distension Ext:    No edema; adequate peripheral perfusion Skin:      Warm and dry; no rash Neuro: alert and oriented x 3 Psych: normal mood and affect  Data Reviewed: PFTs 08/08/14 FVC 1.66 [52%]  FEV1 1.35 (57%] F/F 81 RV/TLC elevated. Mild obstructive small airway disease, hyperinflation, air trapping.  PFTs 10/26/16 FVC 1.41 [47%) FEV1 1.20 (52%) F/F 85 TLC 68% RV/TLC 127% DLCO 71% Minimal obstructive airway disease, hyperinflation, mild restriction and DLCO impairment  CT high res 07/27/14 No ILD. Images reviewed.  Chest x-ray 07/15/16 Improved right basilar infiltrate. Images reviewed.  FENO 08/26/16- 13 CBC with diff 08/26/16- WBC 11, Eos 1.6, Absolute eos 200 Blood allergy profile 08/26/16- Negative, IgE 4  Assessment:  Her symptoms are likely from asthmatic bronchitis. Her PFTs and CT scan from 2015 were reviewed. She does not have any evidence of interstitial lung disease but has very  minimal small airways obstruction. Repeat PFTs show reduction in mid flow rates suggestive of small airway disease with improvement post bronchodilator. She is improving on Symbicort at 160/4.5 and will continue the same.  I suspect her postnasal drip is contributing to her symptoms. She is using the allegra intermittently as it make her sleepy. She has stopped using the dymista because it makes her nose and eyes dry. I will substitute it with flonase. She'll also continue her Protonix for acid reflux.  Plan/Recommendations: - Continue Symbicort - Continue allegra, Stop dymista, start flonase - Continue protonix for acid reflux.   Marshell Garfinkel MD [facility] Pager [phone] 11/05/2016, 1:48 PM  CC: [name], MD

## 2016-11-22 DIAGNOSIS — A630 Anogenital (venereal) warts: Secondary | ICD-10-CM | POA: Insufficient documentation

## 2017-02-23 DIAGNOSIS — E119 Type 2 diabetes mellitus without complications: Secondary | ICD-10-CM

## 2017-02-23 HISTORY — DX: Type 2 diabetes mellitus without complications: E11.9

## 2017-10-01 ENCOUNTER — Encounter (HOSPITAL_COMMUNITY): Payer: Self-pay | Admitting: Pharmacy Technician

## 2017-10-01 ENCOUNTER — Emergency Department (HOSPITAL_COMMUNITY): Payer: Medicare Other

## 2017-10-01 ENCOUNTER — Observation Stay (HOSPITAL_COMMUNITY)
Admission: EM | Admit: 2017-10-01 | Discharge: 2017-10-02 | Disposition: A | Discharge status: Home / Self Care | Payer: Medicare Other | Attending: Internal Medicine | Admitting: Internal Medicine

## 2017-10-01 DIAGNOSIS — J45909 Unspecified asthma, uncomplicated: Secondary | ICD-10-CM | POA: Diagnosis not present

## 2017-10-01 DIAGNOSIS — M1990 Unspecified osteoarthritis, unspecified site: Secondary | ICD-10-CM | POA: Diagnosis not present

## 2017-10-01 DIAGNOSIS — K219 Gastro-esophageal reflux disease without esophagitis: Secondary | ICD-10-CM | POA: Insufficient documentation

## 2017-10-01 DIAGNOSIS — Z8249 Family history of ischemic heart disease and other diseases of the circulatory system: Secondary | ICD-10-CM | POA: Insufficient documentation

## 2017-10-01 DIAGNOSIS — J45902 Unspecified asthma with status asthmaticus: Secondary | ICD-10-CM | POA: Insufficient documentation

## 2017-10-01 DIAGNOSIS — Q613 Polycystic kidney, unspecified: Secondary | ICD-10-CM | POA: Diagnosis not present

## 2017-10-01 DIAGNOSIS — I5032 Chronic diastolic (congestive) heart failure: Secondary | ICD-10-CM | POA: Diagnosis not present

## 2017-10-01 DIAGNOSIS — Z881 Allergy status to other antibiotic agents status: Secondary | ICD-10-CM | POA: Insufficient documentation

## 2017-10-01 DIAGNOSIS — F32A Depression, unspecified: Secondary | ICD-10-CM | POA: Diagnosis present

## 2017-10-01 DIAGNOSIS — J4520 Mild intermittent asthma, uncomplicated: Secondary | ICD-10-CM | POA: Diagnosis present

## 2017-10-01 DIAGNOSIS — J189 Pneumonia, unspecified organism: Secondary | ICD-10-CM | POA: Diagnosis present

## 2017-10-01 DIAGNOSIS — J181 Lobar pneumonia, unspecified organism: Secondary | ICD-10-CM | POA: Diagnosis not present

## 2017-10-01 DIAGNOSIS — F329 Major depressive disorder, single episode, unspecified: Secondary | ICD-10-CM | POA: Insufficient documentation

## 2017-10-01 DIAGNOSIS — Z79899 Other long term (current) drug therapy: Secondary | ICD-10-CM | POA: Insufficient documentation

## 2017-10-01 DIAGNOSIS — F419 Anxiety disorder, unspecified: Secondary | ICD-10-CM | POA: Insufficient documentation

## 2017-10-01 DIAGNOSIS — J129 Viral pneumonia, unspecified: Secondary | ICD-10-CM | POA: Diagnosis not present

## 2017-10-01 DIAGNOSIS — Z7982 Long term (current) use of aspirin: Secondary | ICD-10-CM | POA: Diagnosis not present

## 2017-10-01 DIAGNOSIS — N182 Chronic kidney disease, stage 2 (mild): Secondary | ICD-10-CM | POA: Insufficient documentation

## 2017-10-01 DIAGNOSIS — Z940 Kidney transplant status: Secondary | ICD-10-CM | POA: Diagnosis not present

## 2017-10-01 DIAGNOSIS — E213 Hyperparathyroidism, unspecified: Secondary | ICD-10-CM | POA: Insufficient documentation

## 2017-10-01 DIAGNOSIS — Z888 Allergy status to other drugs, medicaments and biological substances status: Secondary | ICD-10-CM | POA: Diagnosis not present

## 2017-10-01 DIAGNOSIS — J101 Influenza due to other identified influenza virus with other respiratory manifestations: Secondary | ICD-10-CM | POA: Diagnosis not present

## 2017-10-01 DIAGNOSIS — J111 Influenza due to unidentified influenza virus with other respiratory manifestations: Secondary | ICD-10-CM | POA: Diagnosis present

## 2017-10-01 DIAGNOSIS — J1008 Influenza due to other identified influenza virus with other specified pneumonia: Principal | ICD-10-CM | POA: Insufficient documentation

## 2017-10-01 DIAGNOSIS — Z885 Allergy status to narcotic agent status: Secondary | ICD-10-CM | POA: Insufficient documentation

## 2017-10-01 DIAGNOSIS — Z87891 Personal history of nicotine dependence: Secondary | ICD-10-CM | POA: Diagnosis not present

## 2017-10-01 DIAGNOSIS — E785 Hyperlipidemia, unspecified: Secondary | ICD-10-CM | POA: Insufficient documentation

## 2017-10-01 LAB — COMPREHENSIVE METABOLIC PANEL
ALT: 11 U/L — AB (ref 14–54)
AST: 22 U/L (ref 15–41)
Albumin: 3 g/dL — ABNORMAL LOW (ref 3.5–5.0)
Alkaline Phosphatase: 121 U/L (ref 38–126)
Anion gap: 12 (ref 5–15)
BUN: 23 mg/dL — ABNORMAL HIGH (ref 6–20)
CHLORIDE: 102 mmol/L (ref 101–111)
CO2: 20 mmol/L — AB (ref 22–32)
CREATININE: 1.11 mg/dL — AB (ref 0.44–1.00)
Calcium: 9.6 mg/dL (ref 8.9–10.3)
GFR calc non Af Amer: 53 mL/min — ABNORMAL LOW (ref >60)
Glucose, Bld: 91 mg/dL (ref 65–99)
POTASSIUM: 4 mmol/L (ref 3.5–5.1)
SODIUM: 134 mmol/L — AB (ref 135–145)
Total Bilirubin: 1.4 mg/dL — ABNORMAL HIGH (ref 0.3–1.2)
Total Protein: 7.3 g/dL (ref 6.5–8.1)

## 2017-10-01 LAB — TROPONIN I

## 2017-10-01 LAB — CBC WITH DIFFERENTIAL/PLATELET
Basophils Absolute: 0 10*3/uL (ref 0.0–0.1)
Basophils Relative: 0 %
EOS ABS: 0.2 10*3/uL (ref 0.0–0.7)
Eosinophils Relative: 3 %
HEMATOCRIT: 39.4 % (ref 36.0–46.0)
HEMOGLOBIN: 12.1 g/dL (ref 12.0–15.0)
LYMPHS ABS: 1 10*3/uL (ref 0.7–4.0)
LYMPHS PCT: 16 %
MCH: 28.9 pg (ref 26.0–34.0)
MCHC: 30.7 g/dL (ref 30.0–36.0)
MCV: 94 fL (ref 78.0–100.0)
MONOS PCT: 8 %
Monocytes Absolute: 0.5 10*3/uL (ref 0.1–1.0)
NEUTROS PCT: 73 %
Neutro Abs: 4.6 10*3/uL (ref 1.7–7.7)
Platelets: 151 10*3/uL (ref 150–400)
RBC: 4.19 MIL/uL (ref 3.87–5.11)
RDW: 17.9 % — ABNORMAL HIGH (ref 11.5–15.5)
WBC: 6.2 10*3/uL (ref 4.0–10.5)

## 2017-10-01 LAB — URINALYSIS, ROUTINE W REFLEX MICROSCOPIC
BILIRUBIN URINE: NEGATIVE
Glucose, UA: NEGATIVE mg/dL
KETONES UR: NEGATIVE mg/dL
Nitrite: NEGATIVE
PROTEIN: NEGATIVE mg/dL
Specific Gravity, Urine: 1.011 (ref 1.005–1.030)
pH: 5 (ref 5.0–8.0)

## 2017-10-01 LAB — I-STAT CG4 LACTIC ACID, ED: LACTIC ACID, VENOUS: 1.51 mmol/L (ref 0.5–1.9)

## 2017-10-01 MED ORDER — SODIUM CHLORIDE 0.9 % IV SOLN
1000.0000 mL | INTRAVENOUS | Status: DC
  Administered 2017-10-01: 1000 mL via INTRAVENOUS

## 2017-10-01 MED ORDER — MAGNESIUM OXIDE 400 (241.3 MG) MG PO TABS
400.0000 mg | ORAL_TABLET | Freq: Two times a day (BID) | ORAL | Status: DC
  Administered 2017-10-01 – 2017-10-02 (×2): 400 mg via ORAL
  Filled 2017-10-01 (×3): qty 1

## 2017-10-01 MED ORDER — METHYLPREDNISOLONE SODIUM SUCC 40 MG IJ SOLR
40.0000 mg | Freq: Two times a day (BID) | INTRAMUSCULAR | Status: DC
  Administered 2017-10-01 – 2017-10-02 (×2): 40 mg via INTRAVENOUS
  Filled 2017-10-01 (×2): qty 1

## 2017-10-01 MED ORDER — ACETAMINOPHEN 325 MG PO TABS
650.0000 mg | ORAL_TABLET | Freq: Four times a day (QID) | ORAL | Status: DC | PRN
  Administered 2017-10-02: 650 mg via ORAL
  Filled 2017-10-01: qty 2

## 2017-10-01 MED ORDER — ALBUTEROL SULFATE (2.5 MG/3ML) 0.083% IN NEBU: 2.5000 mg | INHALATION_SOLUTION | RESPIRATORY_TRACT | Status: DC | PRN

## 2017-10-01 MED ORDER — FEXOFENADINE HCL 30 MG/5ML PO SUSP: 30.0000 mg | Freq: Every day | ORAL | Status: DC | PRN

## 2017-10-01 MED ORDER — ASPIRIN EC 81 MG PO TBEC
81.0000 mg | DELAYED_RELEASE_TABLET | Freq: Every day | ORAL | Status: DC
  Administered 2017-10-02: 81 mg via ORAL
  Filled 2017-10-01: qty 1

## 2017-10-01 MED ORDER — VANCOMYCIN HCL IN DEXTROSE 750-5 MG/150ML-% IV SOLN
750.0000 mg | Freq: Two times a day (BID) | INTRAVENOUS | Status: DC
  Administered 2017-10-02: 750 mg via INTRAVENOUS
  Filled 2017-10-01 (×2): qty 150

## 2017-10-01 MED ORDER — MOMETASONE FURO-FORMOTEROL FUM 200-5 MCG/ACT IN AERO
2.0000 | INHALATION_SPRAY | Freq: Two times a day (BID) | RESPIRATORY_TRACT | Status: DC
  Administered 2017-10-01 – 2017-10-02 (×2): 2 via RESPIRATORY_TRACT
  Filled 2017-10-01: qty 8.8

## 2017-10-01 MED ORDER — FLUTICASONE PROPIONATE 50 MCG/ACT NA SUSP
1.0000 | Freq: Two times a day (BID) | NASAL | Status: DC
  Filled 2017-10-01: qty 16

## 2017-10-01 MED ORDER — ENOXAPARIN SODIUM 40 MG/0.4ML ~~LOC~~ SOLN
40.0000 mg | SUBCUTANEOUS | Status: DC
  Administered 2017-10-02: 40 mg via SUBCUTANEOUS
  Filled 2017-10-01: qty 0.4

## 2017-10-01 MED ORDER — TACROLIMUS 1 MG PO CAPS
1.0000 mg | ORAL_CAPSULE | Freq: Every morning | ORAL | Status: DC
  Administered 2017-10-02: 1 mg via ORAL
  Filled 2017-10-01: qty 1

## 2017-10-01 MED ORDER — HYDROMORPHONE HCL 2 MG PO TABS
2.0000 mg | ORAL_TABLET | ORAL | Status: DC | PRN
Start: 2017-10-01 — End: 2017-10-02

## 2017-10-01 MED ORDER — MYCOPHENOLATE SODIUM 180 MG PO TBEC
180.0000 mg | DELAYED_RELEASE_TABLET | Freq: Two times a day (BID) | ORAL | Status: DC
  Administered 2017-10-02 (×2): 180 mg via ORAL
  Filled 2017-10-01 (×4): qty 1

## 2017-10-01 MED ORDER — OSELTAMIVIR PHOSPHATE 30 MG PO CAPS
30.0000 mg | ORAL_CAPSULE | Freq: Two times a day (BID) | ORAL | Status: DC
  Administered 2017-10-01 – 2017-10-02 (×2): 30 mg via ORAL
  Filled 2017-10-01 (×3): qty 1

## 2017-10-01 MED ORDER — VANCOMYCIN HCL 10 G IV SOLR
1500.0000 mg | Freq: Once | INTRAVENOUS | Status: AC
  Administered 2017-10-01: 1500 mg via INTRAVENOUS
  Filled 2017-10-01: qty 1500

## 2017-10-01 MED ORDER — TACROLIMUS 0.5 MG PO CAPS
0.5000 mg | ORAL_CAPSULE | Freq: Every evening | ORAL | Status: DC
  Administered 2017-10-01: 0.5 mg via ORAL
  Filled 2017-10-01 (×2): qty 1

## 2017-10-01 MED ORDER — PANTOPRAZOLE SODIUM 40 MG PO TBEC
40.0000 mg | DELAYED_RELEASE_TABLET | Freq: Two times a day (BID) | ORAL | Status: DC
  Administered 2017-10-02: 40 mg via ORAL
  Filled 2017-10-01: qty 1

## 2017-10-01 MED ORDER — SODIUM CHLORIDE 0.9 % IV SOLN
1000.0000 mL | INTRAVENOUS | Status: AC
  Administered 2017-10-01: 1000 mL via INTRAVENOUS

## 2017-10-01 MED ORDER — ALLOPURINOL 300 MG PO TABS
300.0000 mg | ORAL_TABLET | Freq: Every day | ORAL | Status: DC
  Administered 2017-10-02: 300 mg via ORAL
  Filled 2017-10-01: qty 1

## 2017-10-01 MED ORDER — CYCLOSPORINE 0.05 % OP EMUL
1.0000 [drp] | Freq: Two times a day (BID) | OPHTHALMIC | Status: DC
  Administered 2017-10-01 – 2017-10-02 (×2): 1 [drp] via OPHTHALMIC
  Filled 2017-10-01 (×3): qty 1

## 2017-10-01 MED ORDER — LORATADINE 5 MG/5ML PO SYRP
10.0000 mg | ORAL_SOLUTION | Freq: Every day | ORAL | Status: DC
  Filled 2017-10-01: qty 10

## 2017-10-01 MED ORDER — SODIUM CHLORIDE 0.9 % IV BOLUS (SEPSIS)
500.0000 mL | Freq: Once | INTRAVENOUS | Status: AC
  Administered 2017-10-01: 500 mL via INTRAVENOUS

## 2017-10-01 MED ORDER — VANCOMYCIN HCL IN DEXTROSE 1-5 GM/200ML-% IV SOLN
1000.0000 mg | Freq: Once | INTRAVENOUS | Status: DC
  Filled 2017-10-01: qty 200

## 2017-10-01 MED ORDER — AZELASTINE HCL 0.1 % NA SOLN
1.0000 | Freq: Two times a day (BID) | NASAL | Status: DC
  Administered 2017-10-02: 1 via NASAL
  Filled 2017-10-01: qty 30

## 2017-10-01 MED ORDER — OSELTAMIVIR PHOSPHATE 75 MG PO CAPS: 75.0000 mg | ORAL_CAPSULE | Freq: Once | ORAL | Status: DC

## 2017-10-01 MED ORDER — LEVOFLOXACIN IN D5W 750 MG/150ML IV SOLN
750.0000 mg | INTRAVENOUS | Status: DC
  Filled 2017-10-01: qty 150

## 2017-10-01 MED ORDER — FLUOXETINE HCL 20 MG PO CAPS
40.0000 mg | ORAL_CAPSULE | Freq: Every day | ORAL | Status: DC
  Administered 2017-10-02: 40 mg via ORAL
  Filled 2017-10-01 (×2): qty 2

## 2017-10-01 MED ORDER — AZELASTINE-FLUTICASONE 137-50 MCG/ACT NA SUSP: 1.0000 | Freq: Two times a day (BID) | NASAL | Status: DC

## 2017-10-01 MED ORDER — VITAMIN B-12 100 MCG PO TABS
100.0000 ug | ORAL_TABLET | Freq: Every day | ORAL | Status: DC
  Administered 2017-10-02: 100 ug via ORAL
  Filled 2017-10-01: qty 1

## 2017-10-01 MED ORDER — ONDANSETRON HCL 4 MG/2ML IJ SOLN: 4.0000 mg | Freq: Four times a day (QID) | INTRAMUSCULAR | Status: DC | PRN

## 2017-10-01 MED ORDER — LEVOFLOXACIN IN D5W 750 MG/150ML IV SOLN
750.0000 mg | Freq: Once | INTRAVENOUS | Status: AC
  Administered 2017-10-01: 750 mg via INTRAVENOUS
  Filled 2017-10-01: qty 150

## 2017-10-01 MED ORDER — LATANOPROST 0.005 % OP SOLN
1.0000 [drp] | Freq: Every day | OPHTHALMIC | Status: DC
  Administered 2017-10-01: 1 [drp] via OPHTHALMIC
  Filled 2017-10-01: qty 2.5

## 2017-10-01 NOTE — ED Triage Notes (Signed)
Pt arrives via [facility]. NVD with cough and chills since Sunday. Went to [facility] and +flu per MD. Kidney transplant in 2015. Unable to keep meds down. BP 84/42 with ems. 12 lead unremarkable. 90% RA. Simple mask at 8 increased oxygen saturations to 96%. 102.2 tympanic. 300cc NS en route. 22 R hand. Pt appears to be jaundiced.

## 2017-10-01 NOTE — ED Provider Notes (Signed)
[facility] EMERGENCY DEPARTMENT Provider Note   CSN: [id number] Arrival date & time: 10/01/17  1805     History   Chief Complaint No chief complaint on file.   HPI April [name] is a 61 y.o. female.  61 year old female with history of kidney transplant secondary to polycystic kidney disease who presents from her doctor's office after being diagnosed with influenza B.  Patient has had symptoms times 5 days consisting of a mild headache, diarrhea, emesis.  Some sore throat and congestion.  Cannot keep down any fluids.  Has had a cough which is been nonproductive.  No reported urinary symptoms.  No neck pain or photophobia.  Her physician's office today was noted to be hypotensive with a blood pressure of 84.  Was also noted to have a tympanic temperature of 102.2.  Patient given a bolus of IV saline 300 cc.  Was transferred here for further management      Past Medical History:  Diagnosis Date  . Arthritis   . Asthma   . Depression   . GERD (gastroesophageal reflux disease)   . Hyperlipidemia   . Hyperparathyroidism   . Polycystic kidney   . PONV (postoperative nausea and vomiting)     Patient Active Problem List   Diagnosis Date Noted  . CAP (community acquired pneumonia) 07/15/2016  . Asthma exacerbation 07/15/2016  . GERD (gastroesophageal reflux disease) 07/15/2016  . Renal transplant recipient 07/15/2016  . Hyperlipidemia 07/15/2016  . Depression 07/15/2016  . Bronchitis, mucopurulent recurrent ([place]) 08/08/2014  . Chronic cough 07/24/2014  . End stage renal disease ([place]) 03/23/2012  . 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 BILATERAL OOPHORECTOMY    . CARPAL TUNNEL RELEASE  2000  . CESAREAN SECTION    . 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 TONSILLECTOMY  1967  . TUBAL LIGATION   2010    OB History    No data available       Home Medications    Prior to Admission medications   Medication Sig Start Date End Date Taking? Authorizing Provider  albuterol (PROVENTIL HFA;VENTOLIN HFA) 108 (90 Base) MCG/ACT inhaler Inhale 2 puffs into the lungs every 4 (four) hours as needed for wheezing or shortness of breath. 07/16/16   Ghimire, Henreitta Leber, MD  allopurinol (ZYLOPRIM) 300 MG tablet Take 300 mg by mouth daily.    [provider]  aspirin EC 81 MG tablet Take 81 mg by mouth daily.    [provider]  Azelastine-Fluticasone (DYMISTA) 137-50 MCG/ACT SUSP Place 1 spray into the nose 2 (two) times daily. 08/26/16   Mannam, Hart Robinsons, MD  Azelastine-Fluticasone (DYMISTA) 137-50 MCG/ACT SUSP Place 1 spray into the nose 2 (two) times daily. 08/26/16 08/27/16  Marshell Garfinkel, MD  budesonide-formoterol (SYMBICORT) 160-4.5 MCG/ACT inhaler Inhale 2 puffs into the lungs 2 (two) times daily.    [provider]  cycloSPORINE (RESTASIS) 0.05 % ophthalmic emulsion Place 1 drop into both eyes 2 (two) times daily.    [provider]  fexofenadine (ALLEGRA) 30 MG/5ML suspension Take 30 mg by mouth daily as needed (for allergies).     [provider]  FLUoxetine (PROZAC) 40 MG capsule Take 40 mg by mouth daily.    [provider]  furosemide (LASIX) 40 MG tablet Take 1 tablet by mouth  daily. 04/20/14   [provider]  HYDROmorphone (DILAUDID) 2 MG tablet Take 2 mg by mouth every 4 (four) hours as needed. 06/04/16   [provider]  magnesium oxide (MAG-OX) 400 MG tablet Take 400 mg by mouth 2 (two) times daily.    [provider]  mycophenolate (MYFORTIC) 180 MG EC tablet Take 180 mg by mouth 2 (two) times daily.    [provider]  pantoprazole (PROTONIX) 40 MG tablet Take 40 mg by mouth 2 (two) times daily.     [provider]  predniSONE (DELTASONE) 5 MG tablet 40 mg (8 tablets) daily for 1 day,  then, 30 mg (6 tablets) daily for 1 day, then, 20 mg (4 tablets) daily for 1 day, then, 10 mg (2 tablets) daily for 1 day, then, 7.5 mg daily-and stay on it 07/16/16   Jonetta Osgood, MD  tacrolimus (PROGRAF) 0.5 MG capsule Take 0.5 mg by mouth every evening.    [provider]  tacrolimus (PROGRAF) 1 MG capsule Take 1 mg by mouth every morning.     [provider]  TRAVATAN Z 0.004 % SOLN ophthalmic solution Place 1 drop into both eyes at bedtime. 07/03/16   [provider]  vitamin B-12 (CYANOCOBALAMIN) 100 MCG tablet Take 100 mcg by mouth daily.    [provider]    Family History Family History  Problem Relation Age of Onset  . Hypertension Mother   . Heart disease Father        CABG history  . Polycystic kidney disease Father   . Polycystic kidney disease Brother     Social History Social History   Tobacco Use  . Smoking status: Former Smoker    Packs/day: 0.25    Years: 15.00    Pack years: 3.75    Types: Cigarettes    Last attempt to quit: 03/22/1998    Years since quitting: 19.5  . Smokeless tobacco: Never Used  Substance Use Topics  . Alcohol use: No    Comment: social  . Drug use: No     Allergies   Infed [iron dextran]; Pentamidine; Erythromycin [erythromycin]; Oxycodone; Erythromycin; and Ultram [tramadol hcl]   Review of Systems Review of Systems  All other systems reviewed and are negative.    Physical Exam Updated Vital Signs BP 129/70 (BP Location: Right Arm)   Pulse 96   Temp 100.1 F (37.8 C) (Oral)   Resp (!) 24   SpO2 100%   Physical Exam  Constitutional: She is oriented to person, place, and time. She appears well-developed and well-nourished.  Non-toxic appearance. No distress.  HENT:  Head: Normocephalic and atraumatic.  Eyes: Conjunctivae, EOM and lids are normal. Pupils are equal, round, and reactive to light.  Neck: Normal range of motion. Neck supple. No tracheal deviation present. No  thyroid mass present.  Cardiovascular: Normal rate, regular rhythm and normal heart sounds. Exam reveals no gallop.  No murmur heard. Pulmonary/Chest: Effort normal and breath sounds normal. No stridor. No respiratory distress. She has no decreased breath sounds. She has no wheezes. She has no rhonchi. She has no rales.  Abdominal: Soft. Normal appearance and bowel sounds are normal. She exhibits no distension. There is no tenderness. There is no rebound and no CVA tenderness.  Musculoskeletal: Normal range of motion. She exhibits no edema or tenderness.  Neurological: She is alert and oriented to person, place, and time. She has normal strength. No cranial nerve deficit or sensory deficit. GCS  eye subscore is 4. GCS verbal subscore is 5. GCS motor subscore is 6.  Skin: Skin is warm and dry. No abrasion and no rash noted.  Psychiatric: She has a normal mood and affect. Her speech is normal and behavior is normal.  Nursing note and vitals reviewed.    ED Treatments / Results  Labs (all labs ordered are listed, but only abnormal results are displayed) Labs Reviewed  CULTURE, BLOOD (ROUTINE X 2)  CULTURE, BLOOD (ROUTINE X 2)  COMPREHENSIVE METABOLIC PANEL  CBC WITH DIFFERENTIAL/PLATELET  URINALYSIS, ROUTINE W REFLEX MICROSCOPIC  TROPONIN I  I-STAT CG4 LACTIC ACID, ED    EKG  EKG Interpretation  Date/Time:  Friday October 01 2017 18:17:20 EST Ventricular Rate:  97 PR Interval:    QRS Duration: 107 QT Interval:  334 QTC Calculation: 425 R Axis:   86 Text Interpretation:  Sinus rhythm Borderline right axis deviation Low voltage, extremity and precordial leads Anteroseptal infarct, old No significant change since last tracing Confirmed by Lacretia Leigh ([id number]) on 10/01/2017 6:23:46 PM       Radiology No results found.  Procedures Procedures (including critical care time)  Medications Ordered in ED Medications  0.9 %  sodium chloride infusion (not administered)      Initial Impression / Assessment and Plan / ED Course  I have reviewed the triage vital signs and the nursing notes.  Pertinent labs & imaging results that were available during my care of the patient were reviewed by me and considered in my medical decision making (see chart for details).     Patient is started on IV antibiotics and given IV fluids here.  Suspect possible early pneumonia.  Urinalysis also positive for infection.  Discussed with hospitalist and patient to be admitted  Final Clinical Impressions(s) / ED Diagnoses   Final diagnoses:  None    ED Discharge Orders    None       Lacretia Leigh, MD 10/01/17 2117

## 2017-10-01 NOTE — ED Notes (Signed)
Family at bedside. 

## 2017-10-01 NOTE — H&P (Signed)
History and Physical    April [name] WFU:[id number] DOB: 1956-10-15 DOA: 10/01/2017  PCP: [name], MD   Patient coming from: Home, by way of PCP office   Chief Complaint: SOB, productive cough, chills, N/V   HPI: April [name] is a 61 y.o. female with medical history significant for end-stage renal disease status post renal transplantation, depression with anxiety, chronic diastolic CHF, and GERD, now presenting to the emergency department at the direction of her PCP for evaluation of shortness of breath, productive cough, chills, nausea, and vomiting with positive influenza B testing.  Patient reports that she had been in her usual state of health until 4-5 days ago when she noted the insidious development of chills and a mild headache.  She soon developed nausea and sinus congestion.  Since that time, nausea and vomiting has improved some, but she continues to have chills and now shortness of breath and productive cough.  Shortness of breath was particularly bad today and associated with cough productive of thick sputum, prompting her to see her PCP.  Blood pressure was on the low side, she was positive for influenza B, EMS was called, IV was started and she was treated with 300 cc of normal saline prior to arrival in the ED.  She was febrile at the PCP office.  ED Course: Upon arrival to the ED, patient is found to have a temp of 37.8 degree C, saturating adequately on 2 L/min of supplemental oxygen, tachypneic, and with vitals otherwise stable.  EKG features normal sinus rhythm and chest x-ray is notable for low lung volumes and streaky bibasilar opacities.  Chemistry panel reveals a sodium of 134, BUN of 23, and creatinine of 1.11, consistent with her apparent baseline.  CBC is unremarkable, troponin is undetectable, and lactic acid is reassuring at 1.51.  Blood cultures were collected, IV fluid was started, the patient was treated with Levaquin and vancomycin in the ED.  She  remained hemodynamically stable and has not been in any acute distress.  She will be admitted to the medical/surgical unit for ongoing evaluation and influenza B with concern for possible superimposed bacterial pneumonia.  Review of Systems:  All other systems reviewed and apart from HPI, are negative.  Past Medical History:  Diagnosis Date  . Arthritis   . Asthma   . Depression   . GERD (gastroesophageal reflux disease)   . Hyperlipidemia   . Hyperparathyroidism   . Polycystic kidney   . PONV (postoperative nausea and vomiting)     Past Surgical History:  Procedure Laterality Date  . 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 BILATERAL OOPHORECTOMY    . CARPAL TUNNEL RELEASE  2000  . CESAREAN SECTION    . 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 TONSILLECTOMY  1967  . TUBAL LIGATION  2010     reports that she quit smoking about 19 years ago. Her smoking use included cigarettes. She has a 3.75 pack-year smoking history. she has never used smokeless tobacco. She reports that she does not drink alcohol or use drugs.  Allergies  Allergen Reactions  . Infed [Iron Dextran] Other (See Comments)    Chest tightness  . Pentamidine Itching, Shortness Of Breath and Swelling  . Erythromycin [Erythromycin] Other (See Comments)    Mouth Ulcers  . Oxycodone Nausea Only and Nausea And Vomiting  . Erythromycin Rash    Causes breakout in mouth  . Ultram [Tramadol Hcl] Anxiety  Family History  Problem Relation Age of Onset  . Hypertension Mother   . Heart disease Father        CABG history  . Polycystic kidney disease Father   . Polycystic kidney disease Brother      Prior to Admission medications   Medication Sig Start Date End Date Taking? Authorizing Provider  albuterol (PROVENTIL HFA;VENTOLIN HFA) 108 (90 Base) MCG/ACT inhaler Inhale 2 puffs into the lungs every 4 (four) hours as needed for wheezing or shortness of breath. 07/16/16   Ghimire, Henreitta Leber, MD    allopurinol (ZYLOPRIM) 300 MG tablet Take 300 mg by mouth daily.    [provider]  aspirin EC 81 MG tablet Take 81 mg by mouth daily.    [provider]  Azelastine-Fluticasone (DYMISTA) 137-50 MCG/ACT SUSP Place 1 spray into the nose 2 (two) times daily. 08/26/16   Mannam, Hart Robinsons, MD  Azelastine-Fluticasone (DYMISTA) 137-50 MCG/ACT SUSP Place 1 spray into the nose 2 (two) times daily. 08/26/16 08/27/16  Marshell Garfinkel, MD  budesonide-formoterol (SYMBICORT) 160-4.5 MCG/ACT inhaler Inhale 2 puffs into the lungs 2 (two) times daily.    [provider]  cycloSPORINE (RESTASIS) 0.05 % ophthalmic emulsion Place 1 drop into both eyes 2 (two) times daily.    [provider]  fexofenadine (ALLEGRA) 30 MG/5ML suspension Take 30 mg by mouth daily as needed (for allergies).     [provider]  FLUoxetine (PROZAC) 40 MG capsule Take 40 mg by mouth daily.    [provider]  furosemide (LASIX) 40 MG tablet Take 1 tablet by mouth daily. 04/20/14   [provider]  HYDROmorphone (DILAUDID) 2 MG tablet Take 2 mg by mouth every 4 (four) hours as needed. 06/04/16   [provider]  magnesium oxide (MAG-OX) 400 MG tablet Take 400 mg by mouth 2 (two) times daily.    [provider]  mycophenolate (MYFORTIC) 180 MG EC tablet Take 180 mg by mouth 2 (two) times daily.    [provider]  pantoprazole (PROTONIX) 40 MG tablet Take 40 mg by mouth 2 (two) times daily.     [provider]  predniSONE (DELTASONE) 5 MG tablet 40 mg (8 tablets) daily for 1 day, then, 30 mg (6 tablets) daily for 1 day, then, 20 mg (4 tablets) daily for 1 day, then, 10 mg (2 tablets) daily for 1 day, then, 7.5 mg daily-and stay on it 07/16/16   Jonetta Osgood, MD  tacrolimus (PROGRAF) 0.5 MG capsule Take 0.5 mg by mouth every evening.    [provider]  tacrolimus (PROGRAF) 1 MG capsule Take 1 mg by mouth every morning.     [provider]  TRAVATAN Z 0.004 % SOLN ophthalmic solution Place 1 drop into both eyes at bedtime. 07/03/16   [provider]  vitamin B-12 (CYANOCOBALAMIN) 100 MCG tablet Take 100 mcg by mouth daily.    [provider]    Physical Exam: Vitals:   10/01/17 1915 10/01/17 2015 10/01/17 2045 10/01/17 2100  BP: 124/68 133/66 102/86 104/70  Pulse: 97  88 96  Resp: 18  18 (!) 24  Temp:      TempSrc:      SpO2: 97%  97% 97%  Weight:  87.1 kg (192 lb)    Height:  5' 1.5" (1.562 m)        Constitutional: NAD, calm, appears uncomfortable Eyes: PERTLA, lids and conjunctivae normal ENMT: Mucous membranes are moist. Posterior pharynx  clear of any exudate or lesions.   Neck: normal, supple, no masses, no thyromegaly Respiratory: Mild tachypnea and dyspnea with speech. Rhonchi at both bases Lt > Rt.  No accessory muscle use.  Cardiovascular: S1 & S2 heard, regular rate and rhythm. No extremity edema. No significant JVD. Abdomen: No distension, no tenderness, no masses palpated. Bowel sounds normal.  Musculoskeletal: no clubbing / cyanosis. No joint deformity upper and lower extremities.   Skin: no significant rashes, lesions, ulcers. Warm, dry, well-perfused. Neurologic: CN 2-12 grossly intact. Sensation intact. Strength 5/5 in all 4 limbs.  Psychiatric: Alert and oriented x 3. Calm, cooperative.     Labs on Admission: I have personally reviewed following labs and imaging studies  CBC: Recent Labs  Lab 10/01/17 1837  WBC 6.2  NEUTROABS 4.6  HGB 12.1  HCT 39.4  MCV 94.0  PLT 161   Basic Metabolic Panel: Recent Labs  Lab 10/01/17 1837  NA 134*  K 4.0  CL 102  CO2 20*  GLUCOSE 91  BUN 23*  CREATININE 1.11*  CALCIUM 9.6   GFR: Estimated Creatinine Clearance: 54.6 mL/min (A) (by C-G formula based on SCr of 1.11 mg/dL (H)). Liver Function Tests: Recent Labs  Lab 10/01/17 1837  AST 22  ALT 11*  ALKPHOS 121  BILITOT 1.4*  PROT 7.3  ALBUMIN 3.0*   No  results for input(s): LIPASE, AMYLASE in the last 168 hours. No results for input(s): AMMONIA in the last 168 hours. Coagulation Profile: No results for input(s): INR, PROTIME in the last 168 hours. Cardiac Enzymes: Recent Labs  Lab 10/01/17 1837  TROPONINI <0.03   BNP (last 3 results) No results for input(s): PROBNP in the last 8760 hours. HbA1C: No results for input(s): HGBA1C in the last 72 hours. CBG: No results for input(s): GLUCAP in the last 168 hours. Lipid Profile: No results for input(s): CHOL, HDL, LDLCALC, TRIG, CHOLHDL, LDLDIRECT in the last 72 hours. Thyroid Function Tests: No results for input(s): TSH, T4TOTAL, FREET4, T3FREE, THYROIDAB in the last 72 hours. Anemia Panel: No results for input(s): VITAMINB12, FOLATE, FERRITIN, TIBC, IRON, RETICCTPCT in the last 72 hours. Urine analysis:    Component Value Date/Time   COLORURINE YELLOW 10/01/2017 2027   APPEARANCEUR HAZY (A) 10/01/2017 2027   LABSPEC 1.011 10/01/2017 2027   PHURINE 5.0 10/01/2017 2027   GLUCOSEU NEGATIVE 10/01/2017 2027   HGBUR MODERATE (A) 10/01/2017 2027   BILIRUBINUR NEGATIVE 10/01/2017 2027   KETONESUR NEGATIVE 10/01/2017 2027   PROTEINUR NEGATIVE 10/01/2017 2027   UROBILINOGEN 0.2 05/15/2013 0733   NITRITE NEGATIVE 10/01/2017 2027   LEUKOCYTESUR LARGE (A) 10/01/2017 2027   Sepsis Labs: @LABRCNTIP (procalcitonin:4,lacticidven:4) )No results found for this or any previous visit (from the past 240 hour(s)).   Radiological Exams on Admission: Dg Chest 2 View  Result Date: 10/01/2017 CLINICAL DATA:  Short of breath EXAM: CHEST  2 VIEW COMPARISON:  10/01/2017, 04/24/2017 FINDINGS: Low lung volumes with streaky bibasilar atelectasis. Tiny right pleural effusion or thickening. Mild cardiomegaly. No pneumothorax. IMPRESSION: 1. Low lung volumes with streaky bibasilar atelectasis 2. Mild cardiomegaly. Electronically Signed   By: Donavan Foil M.D.   On: 10/01/2017 19:09    EKG: Independently  reviewed. Normal sinus rhythm.   Assessment/Plan  1. Influenza B, pneumonia  - Pt presents with fevers, chills, SOB, productive cough, and N/V  - She was febrile with PCP just PTA, had BP 84/42, and tested positive for influenza B  - There is bibasilar rhonchi on exam, new suplemental  O2 requirement, and concern for a superimposed bacterial PNA in this immunosuppressed patient  - Blood cultures were collected in ED and she was treated with Levaquin and vancomycin  - Plan to add sputum culture, check urine for strep pneumo and legionella antigens, continue supplemental O2, continue current abx, start Tamiflu and adjunctive glucocorticoid with Solu-Medrol 40 mg BID    2. Renal transplant recipient, CKD stage II  - No protein on UA, no HTN, SCr is stable  - Continue Prograf and mycophenolate, hold prednisone while using Solu-Medrol as above    3. Chronic diastolic CHF   - Appears well-compensated  - She is being hydrated in setting of acute infection with low-normal BP  - Hold Lasix initially, follow I/O's and daily wts    4. Asthma  - No wheezing on exam  - Continue ICS/LABA and prn albuterol    5. Anxiety  - Stable  - Continue Prozac    6. GERD - No EGD report on file  - Managed with daily PPI at home, will continue     DVT prophylaxis: Lovenox Code Status: Full  Family Communication: Husband updated at bedside Disposition Plan: Admit to med-surg Consults called: None Admission status: Inpatient    Vianne Bulls, MD [facility] Pager [phone]  If 7PM-7AM, please contact night-coverage [email or web address] Password [email or web address]  10/01/2017, 9:10 PM

## 2017-10-01 NOTE — ED Notes (Signed)
Duplicate HIV blood draw, blood collected at 2207.   Will have nurse to cancel HIV blood test for 0500

## 2017-10-01 NOTE — Progress Notes (Signed)
  Pharmacy Antibiotic Note  April [name] is a 61 y.o. female admitted on 10/01/2017 with pneumonia.  Pharmacy has been consulted for Levaquin/Vanc dosing.  Patient positive for flu per MD   LA 1.51, WBC 6.2, Scr 1.11   Plan: Vancomycin 1500mg  IV X1 then 750mg  IV Q12H.  Goal trough 15-20 mcg/mL. Levaquin 750mg  Q24H  F/U renal function, clinical status, C&S, vanc trough at SS and LOT   Height: 5' 1.5" (156.2 cm) Weight: 192 lb (87.1 kg) IBW/kg (Calculated) : 48.95     Temp (24hrs), Avg:100.1 F (37.8 C), Min:100.1 F (37.8 C), Max:100.1 F (37.8 C)  Recent Labs  Lab 10/01/17 1837 10/01/17 1852  WBC 6.2  --   CREATININE 1.11*  --   LATICACIDVEN  --  1.51    Estimated Creatinine Clearance: 54.6 mL/min (A) (by C-G formula based on SCr of 1.11 mg/dL (H)).    Allergies  Allergen Reactions  . Infed [Iron Dextran] Other (See Comments)    Chest tightness  . Pentamidine Itching, Shortness Of Breath and Swelling  . Erythromycin [Erythromycin] Other (See Comments)    Mouth Ulcers  . Oxycodone Nausea Only and Nausea And Vomiting  . Erythromycin Rash    Causes breakout in mouth  . Ultram [Tramadol Hcl] Anxiety    Antimicrobials this admission: Levaquin 11/9 >> Vanc 11/9 >>    Microbiology results 11/9 BCx:   Thank you for allowing pharmacy to be a part of this patient's care.  Felicity Pellegrini  PharmD Candidate '19 09/27/2017 3:30 PM

## 2017-10-02 ENCOUNTER — Other Ambulatory Visit: Payer: Self-pay

## 2017-10-02 DIAGNOSIS — J181 Lobar pneumonia, unspecified organism: Secondary | ICD-10-CM | POA: Diagnosis not present

## 2017-10-02 LAB — CBC WITH DIFFERENTIAL/PLATELET
BASOS PCT: 0 %
Basophils Absolute: 0 10*3/uL (ref 0.0–0.1)
EOS ABS: 0 10*3/uL (ref 0.0–0.7)
Eosinophils Relative: 1 %
HCT: 37.4 % (ref 36.0–46.0)
HEMOGLOBIN: 11.2 g/dL — AB (ref 12.0–15.0)
LYMPHS ABS: 0.4 10*3/uL — AB (ref 0.7–4.0)
Lymphocytes Relative: 7 %
MCH: 28.3 pg (ref 26.0–34.0)
MCHC: 29.9 g/dL — ABNORMAL LOW (ref 30.0–36.0)
MCV: 94.4 fL (ref 78.0–100.0)
Monocytes Absolute: 0.1 10*3/uL (ref 0.1–1.0)
Monocytes Relative: 2 %
NEUTROS ABS: 5.2 10*3/uL (ref 1.7–7.7)
NEUTROS PCT: 90 %
Platelets: 136 10*3/uL — ABNORMAL LOW (ref 150–400)
RBC: 3.96 MIL/uL (ref 3.87–5.11)
RDW: 17.8 % — ABNORMAL HIGH (ref 11.5–15.5)
WBC: 5.7 10*3/uL (ref 4.0–10.5)

## 2017-10-02 LAB — HIV ANTIBODY (ROUTINE TESTING W REFLEX): HIV Screen 4th Generation wRfx: NONREACTIVE

## 2017-10-02 LAB — BASIC METABOLIC PANEL
ANION GAP: 4 — AB (ref 5–15)
BUN: 18 mg/dL (ref 6–20)
CALCIUM: 7.4 mg/dL — AB (ref 8.9–10.3)
CHLORIDE: 114 mmol/L — AB (ref 101–111)
CO2: 19 mmol/L — ABNORMAL LOW (ref 22–32)
CREATININE: 0.8 mg/dL (ref 0.44–1.00)
GFR calc non Af Amer: >60 mL/min (ref >60)
Glucose, Bld: 124 mg/dL — ABNORMAL HIGH (ref 65–99)
Potassium: 3.6 mmol/L (ref 3.5–5.1)
SODIUM: 137 mmol/L (ref 135–145)

## 2017-10-02 LAB — STREP PNEUMONIAE URINARY ANTIGEN: Strep Pneumo Urinary Antigen: NEGATIVE

## 2017-10-02 MED ORDER — PREDNISONE 10 MG PO TABS: 30.0000 mg | ORAL_TABLET | Freq: Every day | ORAL | 0 refills | Status: DC

## 2017-10-02 MED ORDER — LORATADINE 10 MG PO TABS
10.0000 mg | ORAL_TABLET | Freq: Every day | ORAL | Status: DC
  Administered 2017-10-02: 10 mg via ORAL
  Filled 2017-10-02: qty 1

## 2017-10-02 MED ORDER — LEVOFLOXACIN 750 MG PO TABS: 750.0000 mg | ORAL_TABLET | Freq: Every day | ORAL | 0 refills | Status: AC

## 2017-10-02 MED ORDER — PREDNISONE 5 MG PO TABS: 7.5000 mg | ORAL_TABLET | Freq: Every day | ORAL | 0 refills | Status: AC

## 2017-10-02 MED ORDER — OSELTAMIVIR PHOSPHATE 30 MG PO CAPS
30.0000 mg | ORAL_CAPSULE | Freq: Two times a day (BID) | ORAL | 0 refills | Status: AC
Start: 2017-10-02 — End: 2017-10-06

## 2017-10-02 NOTE — Discharge Instructions (Signed)

## 2017-10-02 NOTE — Discharge Summary (Addendum)
Physician Discharge Summary  April [name] [facility]:[id number] DOB: 1956-09-25 DOA: 10/01/2017  PCP: [name], MD  Admit date: 10/01/2017 Discharge date: 10/02/2017  Recommendations for Outpatient Follow-up:  1. Continue Levaquin for 10 days on discharge 2. Continue Tamiflu for 4 days   Take 3 tablets (30 mg ) on 11/11, then 20 mg (2 tablets) on 11/12 and then 1 tablet (20 mg ) on 11/13. Then, starting 11/14 resume 7.5 mg daily dose  Discharge Diagnoses:  Principal Problem:   Pneumonia Active Problems:   Asthma   GERD (gastroesophageal reflux disease)   Renal transplant recipient   Depression   Chronic diastolic CHF (congestive heart failure) ([place])   Influenza B    Discharge Condition: stable   Diet recommendation: as tolerated   History of present illness:   Per HPI "61 y.o. female with medical history significant for end-stage renal disease status post renal transplantation, depression with anxiety, chronic diastolic CHF, and GERD, now presenting to the emergency department at the direction of her PCP for evaluation of shortness of breath, productive cough, chills, nausea, and vomiting with positive influenza B testing.  Patient reports that she had been in her usual state of health until 4-5 days ago when she noted the insidious development of chills and a mild headache.  She soon developed nausea and sinus congestion.  Since that time, nausea and vomiting has improved some, but she continues to have chills and now shortness of breath and productive cough.  Shortness of breath was particularly bad today and associated with cough productive of thick sputum, prompting her to see her PCP.  Blood pressure was on the low side, she was positive for influenza B, EMS was called, IV was started and she was treated with 300 cc of normal saline prior to arrival in the ED.  She was febrile at the PCP office. ED Course: Upon arrival to the ED, patient is found to have a temp of 37.8 degree  C, saturating adequately on 2 L/min of supplemental oxygen, tachypneic, and with vitals otherwise stable.  EKG features normal sinus rhythm and chest x-ray is notable for low lung volumes and streaky bibasilar opacities.  Chemistry panel reveals a sodium of 134, BUN of 23, and creatinine of 1.11, consistent with her apparent baseline.  CBC is unremarkable, troponin is undetectable, and lactic acid is reassuring at 1.51.  Blood cultures were collected, IV fluid was started, the patient was treated with Levaquin and vancomycin in the ED.  She remained hemodynamically stable and has not been in any acute distress.  She will be admitted to the medical/surgical unit for ongoing evaluation and influenza B with concern for possible superimposed bacterial pneumonia."   Hospital Course:   Principal Problem: Influenza B pneumonia and viral pneumonia - Continue tamiflu for 4 days and Levaquin for 10 days on discharge   Active Problems:   Renal transplant recipient - Continue steroid as per prior to the admission - Continue home meds    Chronic diastolic CHF (congestive heart failure) ([place]) - Compensated   Signed:  Leisa Lenz, MD  [facility] 10/02/2017, 3:22 PM  Pager #: [phone]  Time spent in minutes: more than 30 minutes  Procedures:  None  Consultations:  None  Discharge Exam: Vitals:   10/02/17 1000 10/02/17 1455  BP:  127/61  Pulse:  76  Resp:  19  Temp:  97.9 F (36.6 C)  SpO2: 96% 96%   Vitals:   10/02/17 0500 10/02/17 0502 10/02/17 1000  10/02/17 1455  BP:  116/61  127/61  Pulse:  62  76  Resp:  19  19  Temp:  98.7 F (37.1 C)  97.9 F (36.6 C)  TempSrc:    Oral  SpO2:  95% 96% 96%  Weight: 88.5 kg (195 lb)     Height:        General: Pt is alert, follows commands appropriately, not in acute distress Cardiovascular: Regular rate and rhythm, S1/S2 (+) Respiratory: Clear to auscultation bilaterally, no wheezing, no crackles, no rhonchi Abdominal:  Soft, non tender, non distended, bowel sounds +, no guarding Extremities: no edema, no cyanosis, pulses palpable bilaterally DP and PT Neuro: Grossly nonfocal  Discharge Instructions  Discharge Instructions    Call MD for:  persistant nausea and vomiting   Complete by:  As directed    Call MD for:  redness, tenderness, or signs of infection (pain, swelling, redness, odor or green/yellow discharge around incision site)   Complete by:  As directed    Call MD for:  severe uncontrolled pain   Complete by:  As directed    Diet - low sodium heart healthy   Complete by:  As directed    Diet - low sodium heart healthy   Complete by:  As directed    Discharge instructions   Complete by:  As directed    Take Levaquin for 10 days Take Tamiflu for 4 more days on discharge   Increase activity slowly   Complete by:  As directed    Increase activity slowly   Complete by:  As directed      Allergies as of 10/02/2017      Reactions   Infed [iron Dextran] Other (See Comments)   Chest tightness   Pentamidine Itching, Shortness Of Breath, Swelling   Erythromycin [erythromycin] Other (See Comments)   Mouth Ulcers   Oxycodone Nausea Only, Nausea And Vomiting   Erythromycin Rash   Causes breakout in mouth   Ultram [tramadol Hcl] Anxiety      Medication List    TAKE these medications   albuterol 108 (90 Base) MCG/ACT inhaler Commonly known as:  PROVENTIL HFA;VENTOLIN HFA Inhale 2 puffs into the lungs every 4 (four) hours as needed for wheezing or shortness of breath.   allopurinol 300 MG tablet Commonly known as:  ZYLOPRIM Take 300 mg by mouth daily.   aspirin EC 81 MG tablet Take 81 mg by mouth daily.   Azelastine-Fluticasone 137-50 MCG/ACT Susp Commonly known as:  DYMISTA Place 1 spray into the nose 2 (two) times daily. What changed:    when to take this  reasons to take this   brimonidine 0.1 % Soln Commonly known as:  ALPHAGAN P Place 1 drop 2 (two) times daily into the  left eye.   budesonide-formoterol 160-4.5 MCG/ACT inhaler Commonly known as:  SYMBICORT Inhale 2 puffs 2 (two) times daily as needed into the lungs (SOB).   cycloSPORINE 0.05 % ophthalmic emulsion Commonly known as:  RESTASIS Place 1 drop into both eyes 2 (two) times daily.   fexofenadine 30 MG/5ML suspension Commonly known as:  ALLEGRA Take 30 mg by mouth daily as needed (for allergies).   FLUoxetine 40 MG capsule Commonly known as:  PROZAC Take 40 mg by mouth daily.   furosemide 40 MG tablet Commonly known as:  LASIX Take 1 tablet by mouth daily.   HYDROmorphone 2 MG tablet Commonly known as:  DILAUDID Take 2 mg by mouth every 4 (four) hours as  needed.   levofloxacin 750 MG tablet Commonly known as:  LEVAQUIN Take 1 tablet (750 mg total) daily for 10 days by mouth.   magnesium oxide 400 MG tablet Commonly known as:  MAG-OX Take 400 mg by mouth 2 (two) times daily.   mycophenolate 180 MG EC tablet Commonly known as:  MYFORTIC Take 180 mg by mouth 2 (two) times daily.   oseltamivir 30 MG capsule Commonly known as:  TAMIFLU Take 1 capsule (30 mg total) 2 (two) times daily for 4 days by mouth.   pantoprazole 40 MG tablet Commonly known as:  PROTONIX Take 40 mg 2 (two) times daily as needed by mouth (acid reflux).   prednisoLONE acetate 1 % ophthalmic suspension Commonly known as:  PRED FORTE Place 1 drop See admin instructions into both eyes. 1 drop left eye four times daily. 1 drop right eye once daily.   predniSONE 5 MG tablet Commonly known as:  DELTASONE Take 1.5 tablets (7.5 mg total) daily by mouth. What changed:    how much to take  how to take this  when to take this  additional instructions   predniSONE 10 MG tablet Commonly known as:  DELTASONE Take 3 tablets (30 mg total) daily by mouth. Take 3 tablets (30 mg ) on 11/11, then 20 mg (2 tablets) on 11/12 and then 1 tablet (20 mg ) on 11/13. Then, starting 11/14 resume 7.5 mg daily dose What  changed:  You were already taking a medication with the same name, and this prescription was added. Make sure you understand how and when to take each.   tacrolimus 0.5 MG capsule Commonly known as:  PROGRAF Take 0.5 mg by mouth every evening.   tacrolimus 1 MG capsule Commonly known as:  PROGRAF Take 1 mg by mouth every morning.   TRAVATAN Z 0.004 % Soln ophthalmic solution Generic drug:  Travoprost (BAK Free) Place 1 drop into both eyes at bedtime.   vitamin B-12 100 MCG tablet Commonly known as:  CYANOCOBALAMIN Take 100 mcg by mouth daily.       Follow-up Information    [name], MD. Schedule an appointment as soon as possible for a visit in 1 week(s).   Specialty:  Family Medicine Contact information: [place] [address] [phone]            The results of significant diagnostics from this hospitalization (including imaging, microbiology, ancillary and laboratory) are listed below for reference.    Significant Diagnostic Studies: Dg Chest 2 View  Result Date: 10/01/2017 CLINICAL DATA:  Short of breath EXAM: CHEST  2 VIEW COMPARISON:  10/01/2017, 04/24/2017 FINDINGS: Low lung volumes with streaky bibasilar atelectasis. Tiny right pleural effusion or thickening. Mild cardiomegaly. No pneumothorax. IMPRESSION: 1. Low lung volumes with streaky bibasilar atelectasis 2. Mild cardiomegaly. Electronically Signed   By: Donavan Foil M.D.   On: 10/01/2017 19:09    Microbiology: No results found for this or any previous visit (from the past 240 hour(s)).   Labs: Basic Metabolic Panel: Recent Labs  Lab 10/01/17 1837 10/02/17 0508  NA 134* 137  K 4.0 3.6  CL 102 114*  CO2 20* 19*  GLUCOSE 91 124*  BUN 23* 18  CREATININE 1.11* 0.80  CALCIUM 9.6 7.4*   Liver Function Tests: Recent Labs  Lab 10/01/17 1837  AST 22  ALT 11*  ALKPHOS 121  BILITOT 1.4*  PROT 7.3  ALBUMIN 3.0*   No results for input(s): LIPASE, AMYLASE in  the last  168 hours. No results for input(s): AMMONIA in the last 168 hours. CBC: Recent Labs  Lab 10/01/17 1837 10/02/17 0508  WBC 6.2 5.7  NEUTROABS 4.6 5.2  HGB 12.1 11.2*  HCT 39.4 37.4  MCV 94.0 94.4  PLT 151 136*   Cardiac Enzymes: Recent Labs  Lab 10/01/17 1837  TROPONINI <0.03   BNP: BNP (last 3 results) No results for input(s): BNP in the last 8760 hours.  ProBNP (last 3 results) No results for input(s): PROBNP in the last 8760 hours.  CBG: No results for input(s): GLUCAP in the last 168 hours.

## 2017-10-02 NOTE — Care Management CC44 (Signed)
Condition Code 44 Documentation Completed  Patient Details  Name: April [name] MRN: [id number] Date of Birth: 1955/12/26   Condition Code 44 given:  Yes Patient signature on Condition Code 44 notice:  Yes Documentation of 2 MD's agreement:  Yes Code 44 added to claim:  Yes    Carles Collet, RN 10/02/2017, 3:35 PM

## 2017-10-02 NOTE — Care Management Obs Status (Signed)
[place] NOTIFICATION   Patient Details  Name: April [name] MRN: [id number] Date of Birth: 30-Mar-1956   Medicare Observation Status Notification Given:  Yes    Carles Collet, RN 10/02/2017, 3:35 PM

## 2017-10-04 LAB — LEGIONELLA PNEUMOPHILA SEROGP 1 UR AG: L. pneumophila Serogp 1 Ur Ag: NEGATIVE

## 2017-10-06 LAB — CULTURE, BLOOD (ROUTINE X 2)
CULTURE: NO GROWTH
Culture: NO GROWTH
Special Requests: ADEQUATE

## 2018-01-04 DIAGNOSIS — R87610 Atypical squamous cells of undetermined significance on cytologic smear of cervix (ASC-US): Secondary | ICD-10-CM | POA: Insufficient documentation

## 2018-01-19 IMAGING — DX DG CHEST 2V
2 series · 2 of 2 positions shown · non-contrast
Comparison: Chest radiographs earlier this day. Radiographs
07/02/2015

CLINICAL DATA: Cough.  Chest pain and shortness of breath tonight.

EXAM:
CHEST  2 VIEW

[chest pa]
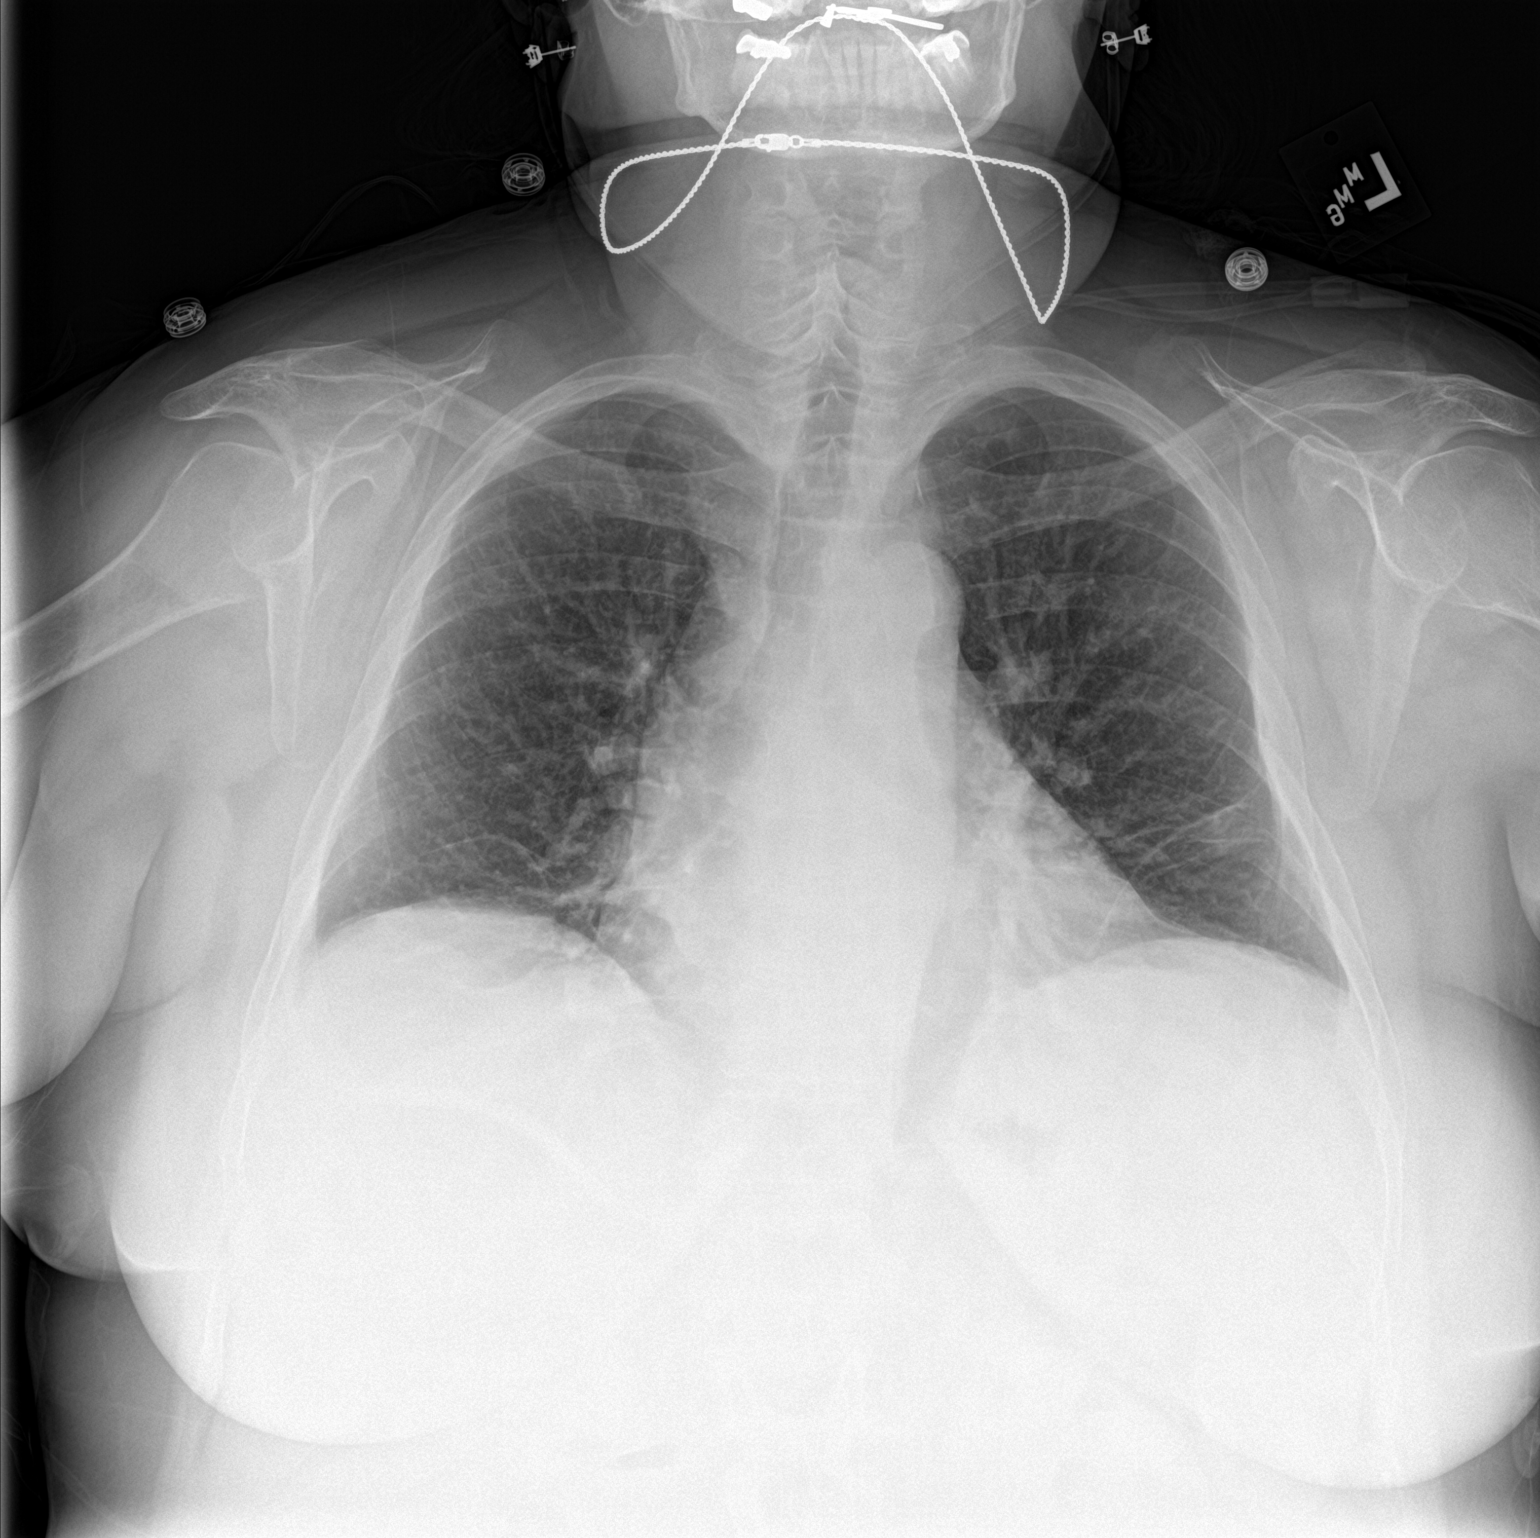

[chest lat]
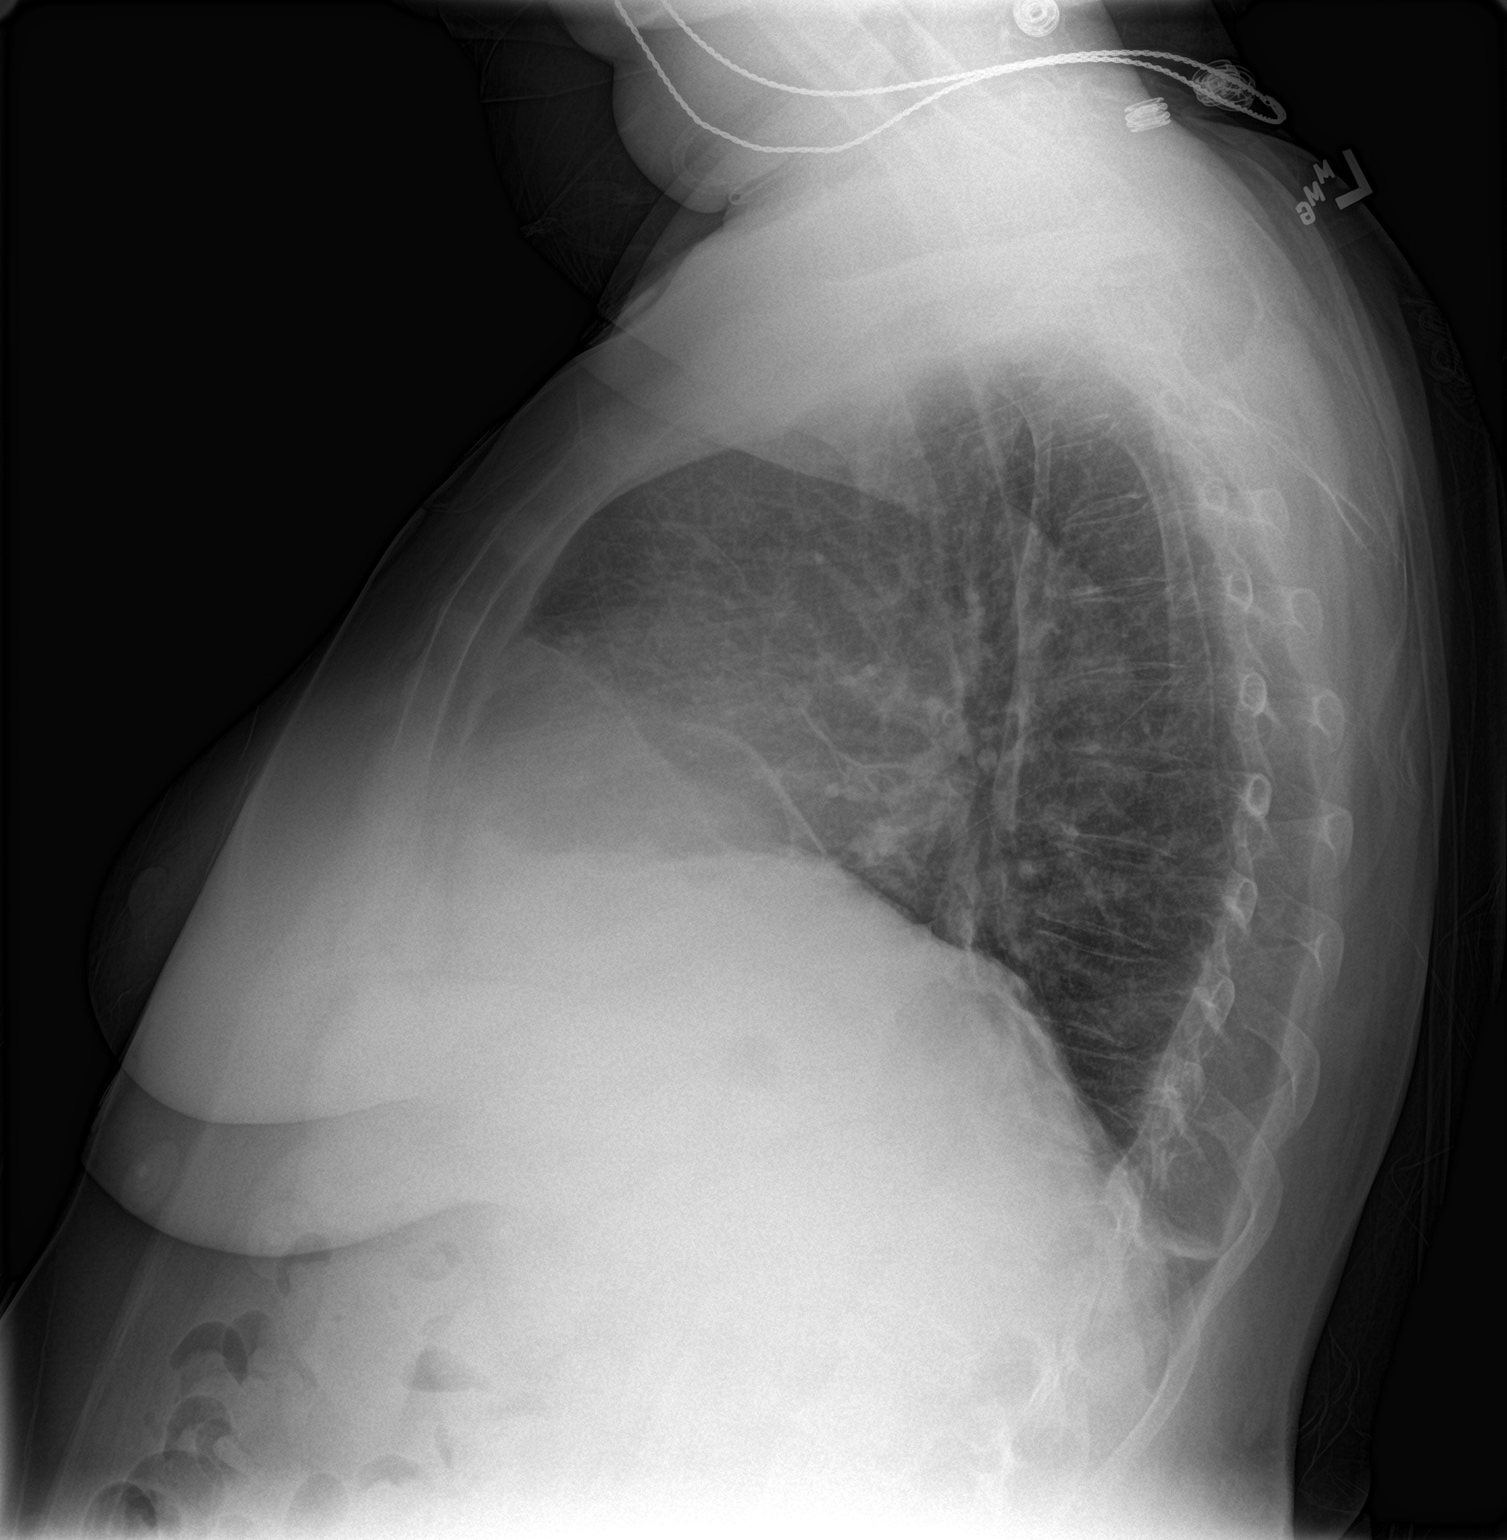

[2 of 2 positions shown; findings below may reference images not displayed]

FINDINGS: Improved right basilar aeration from radiographs earlier this day.
Minimal residual streaky opacities. Left basilar scarring again
seen. Cardiomegaly is unchanged. Interstitial opacities possibly
representing pulmonary edema are unchanged. No new airspace disease,
pleural effusion or pneumothorax. The bones appear under
mineralized.
IMPRESSION: 1. Improved right basilar aeration from exam earlier this day, favor
atelectasis, less likely early pneumonia. Left basilar scarring.
2. Unchanged cardiomegaly and interstitial opacities, may reflect
mild pulmonary edema. This pattern is unchanged from exam 1 year
prior and may be chronic cogestive failure.

## 2018-10-02 ENCOUNTER — Other Ambulatory Visit: Payer: Self-pay

## 2018-10-02 ENCOUNTER — Encounter (HOSPITAL_BASED_OUTPATIENT_CLINIC_OR_DEPARTMENT_OTHER): Payer: Self-pay | Admitting: Emergency Medicine

## 2018-10-02 ENCOUNTER — Emergency Department (HOSPITAL_BASED_OUTPATIENT_CLINIC_OR_DEPARTMENT_OTHER): Payer: Medicare Other

## 2018-10-02 ENCOUNTER — Inpatient Hospital Stay (HOSPITAL_BASED_OUTPATIENT_CLINIC_OR_DEPARTMENT_OTHER)
Admission: EM | Admit: 2018-10-02 | Discharge: 2018-10-05 | DRG: 872 | Disposition: A | Discharge status: Home / Self Care | Payer: Medicare Other | Attending: Internal Medicine | Admitting: Internal Medicine

## 2018-10-02 DIAGNOSIS — K219 Gastro-esophageal reflux disease without esophagitis: Secondary | ICD-10-CM | POA: Diagnosis present

## 2018-10-02 DIAGNOSIS — Z7982 Long term (current) use of aspirin: Secondary | ICD-10-CM | POA: Diagnosis not present

## 2018-10-02 DIAGNOSIS — M109 Gout, unspecified: Secondary | ICD-10-CM | POA: Diagnosis present

## 2018-10-02 DIAGNOSIS — Z940 Kidney transplant status: Secondary | ICD-10-CM

## 2018-10-02 DIAGNOSIS — Z79899 Other long term (current) drug therapy: Secondary | ICD-10-CM | POA: Diagnosis not present

## 2018-10-02 DIAGNOSIS — E785 Hyperlipidemia, unspecified: Secondary | ICD-10-CM | POA: Diagnosis present

## 2018-10-02 DIAGNOSIS — A419 Sepsis, unspecified organism: Secondary | ICD-10-CM | POA: Diagnosis not present

## 2018-10-02 DIAGNOSIS — Q613 Polycystic kidney, unspecified: Secondary | ICD-10-CM

## 2018-10-02 DIAGNOSIS — R197 Diarrhea, unspecified: Secondary | ICD-10-CM

## 2018-10-02 DIAGNOSIS — J45909 Unspecified asthma, uncomplicated: Secondary | ICD-10-CM | POA: Diagnosis present

## 2018-10-02 DIAGNOSIS — J45901 Unspecified asthma with (acute) exacerbation: Secondary | ICD-10-CM | POA: Diagnosis present

## 2018-10-02 DIAGNOSIS — Z87891 Personal history of nicotine dependence: Secondary | ICD-10-CM | POA: Diagnosis not present

## 2018-10-02 DIAGNOSIS — F329 Major depressive disorder, single episode, unspecified: Secondary | ICD-10-CM | POA: Diagnosis present

## 2018-10-02 DIAGNOSIS — E213 Hyperparathyroidism, unspecified: Secondary | ICD-10-CM | POA: Diagnosis not present

## 2018-10-02 DIAGNOSIS — Z7951 Long term (current) use of inhaled steroids: Secondary | ICD-10-CM

## 2018-10-02 DIAGNOSIS — R6510 Systemic inflammatory response syndrome (SIRS) of non-infectious origin without acute organ dysfunction: Secondary | ICD-10-CM | POA: Diagnosis present

## 2018-10-02 DIAGNOSIS — Z888 Allergy status to other drugs, medicaments and biological substances status: Secondary | ICD-10-CM | POA: Diagnosis not present

## 2018-10-02 DIAGNOSIS — N390 Urinary tract infection, site not specified: Secondary | ICD-10-CM | POA: Diagnosis present

## 2018-10-02 DIAGNOSIS — N183 Chronic kidney disease, stage 3 (moderate): Secondary | ICD-10-CM | POA: Diagnosis present

## 2018-10-02 DIAGNOSIS — E860 Dehydration: Secondary | ICD-10-CM | POA: Diagnosis not present

## 2018-10-02 DIAGNOSIS — R112 Nausea with vomiting, unspecified: Secondary | ICD-10-CM | POA: Diagnosis present

## 2018-10-02 DIAGNOSIS — Z885 Allergy status to narcotic agent status: Secondary | ICD-10-CM

## 2018-10-02 DIAGNOSIS — R0602 Shortness of breath: Secondary | ICD-10-CM

## 2018-10-02 DIAGNOSIS — I5032 Chronic diastolic (congestive) heart failure: Secondary | ICD-10-CM | POA: Diagnosis not present

## 2018-10-02 LAB — CBC
HEMATOCRIT: 45.1 % (ref 36.0–46.0)
HEMOGLOBIN: 13.2 g/dL (ref 12.0–15.0)
MCH: 28 pg (ref 26.0–34.0)
MCHC: 29.3 g/dL — AB (ref 30.0–36.0)
MCV: 95.8 fL (ref 80.0–100.0)
Platelets: 198 10*3/uL (ref 150–400)
RBC: 4.71 MIL/uL (ref 3.87–5.11)
RDW: 18.6 % — ABNORMAL HIGH (ref 11.5–15.5)
WBC: 22.7 10*3/uL — ABNORMAL HIGH (ref 4.0–10.5)
nRBC: 0 % (ref 0.0–0.2)

## 2018-10-02 LAB — URINALYSIS, ROUTINE W REFLEX MICROSCOPIC
Glucose, UA: NEGATIVE mg/dL
Ketones, ur: 15 mg/dL — AB
Nitrite: POSITIVE — AB
PH: 5.5 (ref 5.0–8.0)
Protein, ur: 100 mg/dL — AB
SPECIFIC GRAVITY, URINE: 1.025 (ref 1.005–1.030)

## 2018-10-02 LAB — COMPREHENSIVE METABOLIC PANEL
ALBUMIN: 3.3 g/dL — AB (ref 3.5–5.0)
ALK PHOS: 88 U/L (ref 38–126)
ALT: 16 U/L (ref 0–44)
AST: 24 U/L (ref 15–41)
Anion gap: 13 (ref 5–15)
BILIRUBIN TOTAL: 4.5 mg/dL — AB (ref 0.3–1.2)
BUN: 26 mg/dL — AB (ref 8–23)
CALCIUM: 9.2 mg/dL (ref 8.9–10.3)
CO2: 21 mmol/L — ABNORMAL LOW (ref 22–32)
CREATININE: 1.09 mg/dL — AB (ref 0.44–1.00)
Chloride: 100 mmol/L (ref 98–111)
GFR calc Af Amer: >60 mL/min (ref >60)
GFR calc non Af Amer: 54 mL/min — ABNORMAL LOW (ref >60)
GLUCOSE: 110 mg/dL — AB (ref 70–99)
Potassium: 4.3 mmol/L (ref 3.5–5.1)
Sodium: 134 mmol/L — ABNORMAL LOW (ref 135–145)
TOTAL PROTEIN: 7.6 g/dL (ref 6.5–8.1)

## 2018-10-02 LAB — I-STAT CG4 LACTIC ACID, ED: Lactic Acid, Venous: 2.18 mmol/L (ref 0.5–1.9)

## 2018-10-02 LAB — TROPONIN I

## 2018-10-02 LAB — URINALYSIS, MICROSCOPIC (REFLEX)

## 2018-10-02 LAB — BRAIN NATRIURETIC PEPTIDE: B Natriuretic Peptide: 309.1 pg/mL — ABNORMAL HIGH (ref 0.0–100.0)

## 2018-10-02 LAB — LIPASE, BLOOD: Lipase: 28 U/L (ref 11–51)

## 2018-10-02 MED ORDER — METRONIDAZOLE IN NACL 5-0.79 MG/ML-% IV SOLN
500.0000 mg | Freq: Three times a day (TID) | INTRAVENOUS | Status: DC
  Administered 2018-10-02 – 2018-10-03 (×2): 500 mg via INTRAVENOUS
  Filled 2018-10-02 (×2): qty 100

## 2018-10-02 MED ORDER — SODIUM CHLORIDE 0.9 % IV SOLN
2.0000 g | Freq: Once | INTRAVENOUS | Status: AC
  Administered 2018-10-02: 2 g via INTRAVENOUS

## 2018-10-02 MED ORDER — ALBUTEROL SULFATE (2.5 MG/3ML) 0.083% IN NEBU
2.5000 mg | INHALATION_SOLUTION | Freq: Once | RESPIRATORY_TRACT | Status: AC
  Administered 2018-10-02: 2.5 mg via RESPIRATORY_TRACT
  Filled 2018-10-02: qty 3

## 2018-10-02 MED ORDER — CEFEPIME HCL 2 G IJ SOLR
INTRAMUSCULAR | Status: AC
  Filled 2018-10-02: qty 2

## 2018-10-02 MED ORDER — SODIUM CHLORIDE 0.9 % IV BOLUS
1000.0000 mL | Freq: Once | INTRAVENOUS | Status: AC
  Administered 2018-10-02: 1000 mL via INTRAVENOUS

## 2018-10-02 MED ORDER — SODIUM CHLORIDE 0.9 % IV SOLN
2.0000 g | Freq: Two times a day (BID) | INTRAVENOUS | Status: DC
  Administered 2018-10-03: 2 g via INTRAVENOUS
  Filled 2018-10-02 (×2): qty 2

## 2018-10-02 MED ORDER — ONDANSETRON HCL 4 MG/2ML IJ SOLN
4.0000 mg | Freq: Once | INTRAMUSCULAR | Status: AC
  Administered 2018-10-02: 4 mg via INTRAVENOUS
  Filled 2018-10-02: qty 2

## 2018-10-02 MED ORDER — HYDROCORTISONE NA SUCCINATE PF 100 MG IJ SOLR
100.0000 mg | Freq: Once | INTRAMUSCULAR | Status: AC
  Administered 2018-10-02: 100 mg via INTRAVENOUS
  Filled 2018-10-02: qty 2

## 2018-10-02 NOTE — ED Provider Notes (Signed)
[facility] EMERGENCY DEPARTMENT Provider Note   CSN: [id number] Arrival date & time: 10/02/18  [place]     History   Chief Complaint Chief Complaint  Patient presents with  . Fever  . Emesis  . Diarrhea  . Generalized Body Aches    HPI April [name] is a 62 y.o. female.  Patient is a 62 year old female with a history of polycystic kidney disease status post kidney transplant, CHF, hyperlipidemia, and asthma who is presenting today with 3 days of fever, nausea, vomiting and diarrhea.  She states after the numerous episodes of vomiting she started to have some shortness of breath and cough.  She denies any productive cough.  Shortness of breath is worse if she attempts to ambulate and she is felt very weak.  She is not able to hold anything down.  She has not kept down her antirejection medication for the last 2 days.  She has had her kidney transplant for approximately 7 years with no prior rejection.  She denies any urinary complaints.  She is not having any localized abdominal pain or chest pain.  Highest temperature was 102 measured orally yesterday.  The history is provided by the patient.  Fever   This is a new problem. Episode onset: 3 days ago. The problem occurs constantly. The problem has been gradually worsening. The maximum temperature noted was 102 to 102.9 F. The temperature was taken using an oral thermometer. Associated symptoms include diarrhea, vomiting, congestion and cough. Associated symptoms comments: Weakness, myalgias, SOB.  No leg swelling.  No urinary or vaginal sx.  Family reports more yellow than normal.. Treatments tried: phenergan. The treatment provided no relief.  Emesis   Associated symptoms include cough, diarrhea and a fever.  Diarrhea   Associated symptoms include vomiting and cough.    Past Medical History:  Diagnosis Date  . Arthritis   . Asthma   . Depression   . GERD (gastroesophageal reflux disease)   . Hyperlipidemia   .  Hyperparathyroidism   . Polycystic kidney   . PONV (postoperative nausea and vomiting)     Patient Active Problem List   Diagnosis Date Noted  . Chronic diastolic CHF (congestive heart failure) ([place]) 10/01/2017  . Influenza B 10/01/2017  . Persistent asthma with status asthmaticus   . Pneumonia 07/15/2016  . Asthma 07/15/2016  . GERD (gastroesophageal reflux disease) 07/15/2016  . Renal transplant recipient 07/15/2016  . Hyperlipidemia 07/15/2016  . Depression 07/15/2016  . Bronchitis, mucopurulent recurrent ([place]) 08/08/2014  . Chronic cough 07/24/2014  . End stage renal disease ([place]) 03/23/2012  . 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 BILATERAL OOPHORECTOMY    . CARPAL TUNNEL RELEASE  2000  . CESAREAN SECTION    . INSERTION OF DIALYSIS CATHETER  03/31/2012   Procedure: INSERTION OF DIALYSIS CATHETER;  Surgeon: Angelia Mould, MD;  Location: [facility];  Service: Vascular;  Laterality: N/A;  insertion of dialysis catheter right internal jugular vein  . 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 TONSILLECTOMY  1967  . TUBAL LIGATION  2010     OB History   None      Home Medications    Prior to Admission medications   Medication Sig Start Date End Date Taking? Authorizing Provider  albuterol (PROVENTIL HFA;VENTOLIN HFA) 108 (90 Base) MCG/ACT  inhaler Inhale 2 puffs into the lungs every 4 (four) hours as needed for wheezing or shortness of breath. 07/16/16   Ghimire, Henreitta Leber, MD  allopurinol (ZYLOPRIM) 300 MG tablet Take 300 mg by mouth daily.    [provider]  aspirin EC 81 MG tablet Take 81 mg by mouth daily.    [provider]  Azelastine-Fluticasone (DYMISTA) 137-50 MCG/ACT SUSP Place 1 spray into the nose 2 (two) times daily. Patient taking differently: Place 1 spray 2 (two) times daily as  needed into the nose (alergies).  08/26/16   Mannam, Hart Robinsons, MD  brimonidine (ALPHAGAN P) 0.1 % SOLN Place 1 drop 2 (two) times daily into the left eye.    [provider]  budesonide-formoterol (SYMBICORT) 160-4.5 MCG/ACT inhaler Inhale 2 puffs 2 (two) times daily as needed into the lungs (SOB).     [provider]  cycloSPORINE (RESTASIS) 0.05 % ophthalmic emulsion Place 1 drop into both eyes 2 (two) times daily.    [provider]  fexofenadine (ALLEGRA) 30 MG/5ML suspension Take 30 mg by mouth daily as needed (for allergies).     [provider]  FLUoxetine (PROZAC) 40 MG capsule Take 40 mg by mouth daily.    [provider]  furosemide (LASIX) 40 MG tablet Take 1 tablet by mouth daily. 04/20/14   [provider]  HYDROmorphone (DILAUDID) 2 MG tablet Take 2 mg by mouth every 4 (four) hours as needed. 06/04/16   [provider]  magnesium oxide (MAG-OX) 400 MG tablet Take 400 mg by mouth 2 (two) times daily.    [provider]  mycophenolate (MYFORTIC) 180 MG EC tablet Take 180 mg by mouth 2 (two) times daily.    [provider]  pantoprazole (PROTONIX) 40 MG tablet Take 40 mg 2 (two) times daily as needed by mouth (acid reflux).     [provider]  prednisoLONE acetate (PRED FORTE) 1 % ophthalmic suspension Place 1 drop See admin instructions into both eyes. 1 drop left eye four times daily. 1 drop right eye once daily.    [provider]  predniSONE (DELTASONE) 10 MG tablet Take 3 tablets (30 mg total) daily by mouth. Take 3 tablets (30 mg ) on 11/11, then 20 mg (2 tablets) on 11/12 and then 1 tablet (20 mg ) on 11/13. Then, starting 11/14 resume 7.5 mg daily dose 10/02/17   Robbie Lis, MD  tacrolimus (PROGRAF) 0.5 MG capsule Take 0.5 mg by mouth every evening.    [provider]  tacrolimus (PROGRAF) 1 MG capsule Take 1 mg by mouth every morning.     [provider]  TRAVATAN Z  0.004 % SOLN ophthalmic solution Place 1 drop into both eyes at bedtime. 07/03/16   [provider]  vitamin B-12 (CYANOCOBALAMIN) 100 MCG tablet Take 100 mcg by mouth daily.    [provider]    Family History Family History  Problem Relation Age of Onset  . Hypertension Mother   . Heart disease Father        CABG history  . Polycystic kidney disease Father   . Polycystic kidney disease Brother     Social History Social History   Tobacco Use  . Smoking status: Former Smoker    Packs/day: 0.25    Years: 15.00    Pack years: 3.75    Types: Cigarettes    Last attempt to quit: 03/22/1998    Years since quitting: 20.5  .  Smokeless tobacco: Never Used  Substance Use Topics  . Alcohol use: No    Comment: social  . Drug use: No     Allergies   Infed [iron dextran]; Pentamidine; Erythromycin [erythromycin]; Oxycodone; Erythromycin; and Ultram [tramadol hcl]   Review of Systems Review of Systems  Constitutional: Positive for fever.  HENT: Positive for congestion.   Respiratory: Positive for cough.   Gastrointestinal: Positive for diarrhea and vomiting.  All other systems reviewed and are negative.    Physical Exam Updated Vital Signs BP (!) 115/55 (BP Location: Right Arm)   Pulse (!) 111   Temp 98.8 F (37.1 C) (Oral) Comment: Pt had ice water PTA  Resp 20   Ht 5' 1.5" (1.562 m)   Wt 83.5 kg   SpO2 96%   BMI 34.20 kg/m   Physical Exam  Constitutional: She is oriented to person, place, and time. She appears well-developed and well-nourished. She appears ill. No distress.  HENT:  Head: Normocephalic and atraumatic.  Dry mucous membranes  Eyes: Pupils are equal, round, and reactive to light. EOM are normal. Scleral icterus is present.  Pale conjuctiva  Neck: Normal range of motion. Neck supple.  Cardiovascular: Regular rhythm and intact distal pulses. Tachycardia present.  Murmur heard.  Systolic murmur is present with a grade of  2/6. Pulmonary/Chest: Effort normal. No respiratory distress. She has decreased breath sounds in the right lower field and the left lower field. She has no wheezes. She has rales in the left lower field.  Abdominal: Soft. She exhibits no distension. There is no tenderness. There is no rebound and no guarding.  Musculoskeletal: Normal range of motion. She exhibits no edema or tenderness.  Neurological: She is alert and oriented to person, place, and time.  Skin: Skin is warm and dry. No rash noted. No erythema.  Appears jaundiced  Psychiatric: She has a normal mood and affect. Her behavior is normal.  Nursing note and vitals reviewed.    ED Treatments / Results  Labs (all labs ordered are listed, but only abnormal results are displayed) Labs Reviewed  CBC - Abnormal; Notable for the following components:      Result Value   WBC 22.7 (*)    MCHC 29.3 (*)    RDW 18.6 (*)    All other components within normal limits  URINALYSIS, ROUTINE W REFLEX MICROSCOPIC - Abnormal; Notable for the following components:   Color, Urine ORANGE (*)    APPearance CLOUDY (*)    Hgb urine dipstick MODERATE (*)    Bilirubin Urine MODERATE (*)    Ketones, ur 15 (*)    Protein, ur 100 (*)    Nitrite POSITIVE (*)    Leukocytes, UA MODERATE (*)    All other components within normal limits  BRAIN NATRIURETIC PEPTIDE - Abnormal; Notable for the following components:   B Natriuretic Peptide 309.1 (*)    All other components within normal limits  COMPREHENSIVE METABOLIC PANEL - Abnormal; Notable for the following components:   Sodium 134 (*)    CO2 21 (*)    Glucose, Bld 110 (*)    BUN 26 (*)    Creatinine, Ser 1.09 (*)    Albumin 3.3 (*)    Total Bilirubin 4.5 (*)    GFR calc non Af Amer 54 (*)    All other components within normal limits  URINALYSIS, MICROSCOPIC (REFLEX) - Abnormal; Notable for the following components:   Bacteria, UA MANY (*)    All other components  within normal limits  I-STAT CG4  LACTIC ACID, ED - Abnormal; Notable for the following components:   Lactic Acid, Venous 2.18 (*)    All other components within normal limits  CULTURE, BLOOD (ROUTINE X 2)  CULTURE, BLOOD (ROUTINE X 2)  URINE CULTURE  TROPONIN I  LIPASE, BLOOD  I-STAT CG4 LACTIC ACID, ED    EKG EKG Interpretation  Date/Time:  Sunday October 02 2018 19:03:21 EST Ventricular Rate:  112 PR Interval:  124 QRS Duration: 78 QT Interval:  342 QTC Calculation: 466 R Axis:   86 Text Interpretation:  Sinus tachycardia Cannot rule out Anterior infarct , age undetermined No significant change since last tracing Confirmed by Blanchie Dessert [phone]) on 10/02/2018 7:07:05 PM   Radiology Dg Chest Port 1 View  Result Date: 10/02/2018 CLINICAL DATA:  Shortness of breath, fever for 2 days. EXAM: PORTABLE CHEST 1 VIEW COMPARISON:  Chest x-rays dated 08/30/2018 and 07/15/2016. FINDINGS: Study is hypoinspiratory with crowding of the perihilar and bibasilar bronchovascular markings. Given the low lung volumes, lungs appear clear. No evidence of pneumonia. No pleural effusion or pneumothorax seen. Heart size and mediastinal contours are stable. IMPRESSION: Low lung volumes. No active disease. No evidence of pneumonia or pulmonary edema. Electronically Signed   By: Franki Cabot M.D.   On: 10/02/2018 20:00    Procedures Procedures (including critical care time)  Medications Ordered in ED Medications  sodium chloride 0.9 % bolus 1,000 mL (has no administration in time range)     Initial Impression / Assessment and Plan / ED Course  I have reviewed the triage vital signs and the nursing notes.  Pertinent labs & imaging results that were available during my care of the patient were reviewed by me and considered in my medical decision making (see chart for details).  Pt with multiple medical problems who is currently immunocompromise from renal transplant presenting with 3 days of fever, vomiting and diarrhea.   Patient also appears slightly jaundiced on exam.  Concern for dehydration in addition to sepsis.  Patient has no focal abdominal tenderness but concern for acute kidney injury and UTI versus cholecystitis versus hepatitis.  Also possibility this is a viral etiology. Pt also c/o of cough and mild SOB but thinks it's her asthma.  No wheezing on exam and rales in the LLL.  No JVD or distal edema consistent with CHF.   Patient has not been able to hold down her antirejection medication.  On exam here she is neurologically intact.  She is tachycardic and appears dehydrated.  9:28 PM CBC with a leukocytosis of 22,000, lactate mildly elevated at 2.18, CMP with creatinine still within baseline at 1.09 mildly elevated BUN.  Patient's LFTs are within normal limits however total bilirubin is elevated at 4.5.  Patient still has no pain in the right upper quadrant or other places within the abdomen.  Chest x-ray negative for pneumonia or signs of fluid overload.  BNP of 300.  After 2 L of IV fluid nausea has improved.  UA with concern for possible UTI with positive nitrites, leukocytes and no evidence of contamination.  Patient was cover for intra-abdominal pathology.  Stress dose hydrocortisone was given per hospitalist request that she has not been able to hold down her antirejection medication.  She was still complaining of feeling tight like she could not get a deep breath.  Tried albuterol nebulizer to see if that helped.  However patient's oxygen saturation has remained in the mid to high 90s.  Will admit for further care.  CRITICAL CARE Performed by: Matti Minney Total critical care time: 30 minutes Critical care time was exclusive of separately billable procedures and treating other patients. Critical care was necessary to treat or prevent imminent or life-threatening deterioration. Critical care was time spent personally by me on the following activities: development of treatment plan with patient and/or  surrogate as well as nursing, discussions with consultants, evaluation of patient's response to treatment, examination of patient, obtaining history from patient or surrogate, ordering and performing treatments and interventions, ordering and review of laboratory studies, ordering and review of radiographic studies, pulse oximetry and re-evaluation of patient's condition.  Final Clinical Impressions(s) / ED Diagnoses   Final diagnoses:  Sepsis without acute organ dysfunction, due to unspecified organism [facility])  Lower urinary tract infectious disease  Nausea vomiting and diarrhea    ED Discharge Orders    None       Blanchie Dessert, MD 10/02/18 2131

## 2018-10-02 NOTE — Progress Notes (Signed)
Pharmacy Antibiotic Note  April [name] is a 62 y.o. female admitted on 10/02/2018 with sepsis.  Pharmacy has been consulted for cefepime dosing. CrCl ~50, lactate up.  Plan: Cefepime 2g IV q12h Follow cultures, renal function  Height: 5' 1.5" (156.2 cm) Weight: 184 lb (83.5 kg) IBW/kg (Calculated) : 48.95  Temp (24hrs), Avg:98.8 F (37.1 C), Min:98.8 F (37.1 C), Max:98.8 F (37.1 C)  Recent Labs  Lab 10/02/18 1929 10/02/18 1934 10/02/18 2021  WBC 22.7*  --   --   CREATININE  --   --  1.09*  LATICACIDVEN  --  2.18*  --     Estimated Creatinine Clearance: 53.7 mL/min (A) (by C-G formula based on SCr of 1.09 mg/dL (H)).    Allergies  Allergen Reactions  . Infed [Iron Dextran] Other (See Comments)    Chest tightness  . Pentamidine Itching, Shortness Of Breath and Swelling  . Erythromycin [Erythromycin] Other (See Comments)    Mouth Ulcers  . Oxycodone Nausea Only and Nausea And Vomiting  . Erythromycin Rash    Causes breakout in mouth  . Ultram [Tramadol Hcl] Anxiety    Antimicrobials this admission: Cefepime 11/10 >>   Dose adjustments this admission: none  Microbiology results: sent  Thank you for allowing pharmacy to be a part of this patient's care.  Arrie Senate, PharmD, BCPS Clinical Pharmacist [phone] Please check AMION for all [facility] numbers 10/02/2018

## 2018-10-02 NOTE — ED Notes (Signed)
Date and time results received: 10/02/18 1941 (use smartphrase ".now" to insert current time)  Test:ISTAT Lactic Acid Critical Value: 2.18  Name of Provider Notified: Dr. Purcell Mouton  Orders Received? Or Actions Taken?:

## 2018-10-02 NOTE — ED Notes (Signed)
[facility] notified (Tammy) - patient ready for transport 

## 2018-10-02 NOTE — ED Triage Notes (Signed)
Pt reports fever, vomiting, diarrhea, generalized body aches, shortness of breath and dizziness since Friday.

## 2018-10-03 ENCOUNTER — Encounter (HOSPITAL_COMMUNITY): Payer: Self-pay | Admitting: Internal Medicine

## 2018-10-03 ENCOUNTER — Inpatient Hospital Stay (HOSPITAL_COMMUNITY): Payer: Medicare Other

## 2018-10-03 DIAGNOSIS — Z87891 Personal history of nicotine dependence: Secondary | ICD-10-CM | POA: Diagnosis not present

## 2018-10-03 DIAGNOSIS — N390 Urinary tract infection, site not specified: Secondary | ICD-10-CM | POA: Diagnosis present

## 2018-10-03 DIAGNOSIS — J45909 Unspecified asthma, uncomplicated: Secondary | ICD-10-CM | POA: Diagnosis present

## 2018-10-03 DIAGNOSIS — F329 Major depressive disorder, single episode, unspecified: Secondary | ICD-10-CM | POA: Diagnosis present

## 2018-10-03 DIAGNOSIS — J45901 Unspecified asthma with (acute) exacerbation: Secondary | ICD-10-CM | POA: Diagnosis present

## 2018-10-03 DIAGNOSIS — Z888 Allergy status to other drugs, medicaments and biological substances status: Secondary | ICD-10-CM | POA: Diagnosis not present

## 2018-10-03 DIAGNOSIS — M109 Gout, unspecified: Secondary | ICD-10-CM | POA: Diagnosis present

## 2018-10-03 DIAGNOSIS — Z940 Kidney transplant status: Secondary | ICD-10-CM | POA: Diagnosis not present

## 2018-10-03 DIAGNOSIS — A419 Sepsis, unspecified organism: Secondary | ICD-10-CM | POA: Diagnosis present

## 2018-10-03 DIAGNOSIS — I5032 Chronic diastolic (congestive) heart failure: Secondary | ICD-10-CM | POA: Diagnosis present

## 2018-10-03 DIAGNOSIS — E213 Hyperparathyroidism, unspecified: Secondary | ICD-10-CM | POA: Diagnosis present

## 2018-10-03 DIAGNOSIS — R112 Nausea with vomiting, unspecified: Secondary | ICD-10-CM | POA: Diagnosis not present

## 2018-10-03 DIAGNOSIS — R6510 Systemic inflammatory response syndrome (SIRS) of non-infectious origin without acute organ dysfunction: Secondary | ICD-10-CM | POA: Diagnosis present

## 2018-10-03 DIAGNOSIS — R7989 Other specified abnormal findings of blood chemistry: Secondary | ICD-10-CM

## 2018-10-03 DIAGNOSIS — R197 Diarrhea, unspecified: Secondary | ICD-10-CM

## 2018-10-03 DIAGNOSIS — R0602 Shortness of breath: Secondary | ICD-10-CM | POA: Diagnosis not present

## 2018-10-03 DIAGNOSIS — Z7951 Long term (current) use of inhaled steroids: Secondary | ICD-10-CM | POA: Diagnosis not present

## 2018-10-03 DIAGNOSIS — E785 Hyperlipidemia, unspecified: Secondary | ICD-10-CM | POA: Diagnosis present

## 2018-10-03 DIAGNOSIS — Z885 Allergy status to narcotic agent status: Secondary | ICD-10-CM | POA: Diagnosis not present

## 2018-10-03 DIAGNOSIS — N183 Chronic kidney disease, stage 3 (moderate): Secondary | ICD-10-CM | POA: Diagnosis present

## 2018-10-03 DIAGNOSIS — Z79899 Other long term (current) drug therapy: Secondary | ICD-10-CM | POA: Diagnosis not present

## 2018-10-03 DIAGNOSIS — Z7982 Long term (current) use of aspirin: Secondary | ICD-10-CM | POA: Diagnosis not present

## 2018-10-03 DIAGNOSIS — E860 Dehydration: Secondary | ICD-10-CM | POA: Diagnosis present

## 2018-10-03 DIAGNOSIS — K219 Gastro-esophageal reflux disease without esophagitis: Secondary | ICD-10-CM | POA: Diagnosis present

## 2018-10-03 DIAGNOSIS — Q613 Polycystic kidney, unspecified: Secondary | ICD-10-CM | POA: Diagnosis not present

## 2018-10-03 LAB — CBC WITH DIFFERENTIAL/PLATELET
ABS IMMATURE GRANULOCYTES: 0.1 10*3/uL — AB (ref 0.00–0.07)
BASOS ABS: 0 10*3/uL (ref 0.0–0.1)
Basophils Relative: 0 %
EOS PCT: 0 %
Eosinophils Absolute: 0 10*3/uL (ref 0.0–0.5)
HEMATOCRIT: 39.5 % (ref 36.0–46.0)
HEMOGLOBIN: 11.4 g/dL — AB (ref 12.0–15.0)
Immature Granulocytes: 1 %
LYMPHS PCT: 4 %
Lymphs Abs: 0.6 10*3/uL — ABNORMAL LOW (ref 0.7–4.0)
MCH: 28.4 pg (ref 26.0–34.0)
MCHC: 28.9 g/dL — AB (ref 30.0–36.0)
MCV: 98.5 fL (ref 80.0–100.0)
Monocytes Absolute: 0.3 10*3/uL (ref 0.1–1.0)
Monocytes Relative: 2 %
NEUTROS ABS: 13.8 10*3/uL — AB (ref 1.7–7.7)
NRBC: 0 % (ref 0.0–0.2)
Neutrophils Relative %: 93 %
Platelets: 153 10*3/uL (ref 150–400)
RBC: 4.01 MIL/uL (ref 3.87–5.11)
RDW: 18.2 % — ABNORMAL HIGH (ref 11.5–15.5)
WBC: 14.8 10*3/uL — AB (ref 4.0–10.5)

## 2018-10-03 LAB — BASIC METABOLIC PANEL
ANION GAP: 8 (ref 5–15)
BUN: 26 mg/dL — ABNORMAL HIGH (ref 8–23)
CALCIUM: 8.5 mg/dL — AB (ref 8.9–10.3)
CO2: 22 mmol/L (ref 22–32)
CREATININE: 1.14 mg/dL — AB (ref 0.44–1.00)
Chloride: 107 mmol/L (ref 98–111)
GFR calc non Af Amer: 51 mL/min — ABNORMAL LOW (ref >60)
GFR, EST AFRICAN AMERICAN: 59 mL/min — AB (ref >60)
Glucose, Bld: 138 mg/dL — ABNORMAL HIGH (ref 70–99)
Potassium: 5.3 mmol/L — ABNORMAL HIGH (ref 3.5–5.1)
Sodium: 137 mmol/L (ref 135–145)

## 2018-10-03 LAB — HEPATIC FUNCTION PANEL
ALBUMIN: 2.8 g/dL — AB (ref 3.5–5.0)
ALK PHOS: 73 U/L (ref 38–126)
ALT: 15 U/L (ref 0–44)
AST: 19 U/L (ref 15–41)
BILIRUBIN TOTAL: 3.1 mg/dL — AB (ref 0.3–1.2)
Bilirubin, Direct: 1.2 mg/dL — ABNORMAL HIGH (ref 0.0–0.2)
Indirect Bilirubin: 1.9 mg/dL — ABNORMAL HIGH (ref 0.3–0.9)
TOTAL PROTEIN: 6.9 g/dL (ref 6.5–8.1)

## 2018-10-03 LAB — PROCALCITONIN: Procalcitonin: 1.01 ng/mL

## 2018-10-03 LAB — D-DIMER, QUANTITATIVE (NOT AT ARMC): D DIMER QUANT: 2.52 ug{FEU}/mL — AB (ref 0.00–0.50)

## 2018-10-03 LAB — TSH: TSH: 0.777 u[IU]/mL (ref 0.350–4.500)

## 2018-10-03 LAB — C DIFFICILE QUICK SCREEN W PCR REFLEX
C DIFFICILE (CDIFF) INTERP: NOT DETECTED
C Diff antigen: NEGATIVE
C Diff toxin: NEGATIVE

## 2018-10-03 LAB — MAGNESIUM: MAGNESIUM: 2.1 mg/dL (ref 1.7–2.4)

## 2018-10-03 LAB — TROPONIN I

## 2018-10-03 LAB — LACTIC ACID, PLASMA
Lactic Acid, Venous: 1.2 mmol/L (ref 0.5–1.9)
Lactic Acid, Venous: 1.3 mmol/L (ref 0.5–1.9)

## 2018-10-03 LAB — HIV ANTIBODY (ROUTINE TESTING W REFLEX): HIV SCREEN 4TH GENERATION: NONREACTIVE

## 2018-10-03 MED ORDER — FLUTICASONE PROPIONATE 50 MCG/ACT NA SUSP
1.0000 | Freq: Every day | NASAL | Status: DC
  Administered 2018-10-03 – 2018-10-05 (×3): 1 via NASAL
  Filled 2018-10-03: qty 16

## 2018-10-03 MED ORDER — CYCLOSPORINE 0.05 % OP EMUL
1.0000 [drp] | Freq: Two times a day (BID) | OPHTHALMIC | Status: DC
  Administered 2018-10-03 – 2018-10-05 (×5): 1 [drp] via OPHTHALMIC
  Filled 2018-10-03 (×5): qty 1

## 2018-10-03 MED ORDER — LATANOPROST 0.005 % OP SOLN
1.0000 [drp] | Freq: Every day | OPHTHALMIC | Status: DC
  Administered 2018-10-03 – 2018-10-04 (×3): 1 [drp] via OPHTHALMIC
  Filled 2018-10-03: qty 2.5

## 2018-10-03 MED ORDER — ACETAMINOPHEN 650 MG RE SUPP: 650.0000 mg | Freq: Four times a day (QID) | RECTAL | Status: DC | PRN

## 2018-10-03 MED ORDER — MYCOPHENOLATE SODIUM 180 MG PO TBEC
180.0000 mg | DELAYED_RELEASE_TABLET | Freq: Two times a day (BID) | ORAL | Status: DC
  Administered 2018-10-03 – 2018-10-05 (×5): 180 mg via ORAL
  Filled 2018-10-03 (×5): qty 1

## 2018-10-03 MED ORDER — ONDANSETRON HCL 4 MG/2ML IJ SOLN: 4.0000 mg | Freq: Four times a day (QID) | INTRAMUSCULAR | Status: DC | PRN

## 2018-10-03 MED ORDER — MAGNESIUM OXIDE 400 (241.3 MG) MG PO TABS
400.0000 mg | ORAL_TABLET | Freq: Two times a day (BID) | ORAL | Status: DC
  Administered 2018-10-03 – 2018-10-05 (×5): 400 mg via ORAL
  Filled 2018-10-03 (×5): qty 1

## 2018-10-03 MED ORDER — TACROLIMUS 1 MG PO CAPS
1.0000 mg | ORAL_CAPSULE | Freq: Every morning | ORAL | Status: DC
  Administered 2018-10-03 – 2018-10-05 (×3): 1 mg via ORAL
  Filled 2018-10-03 (×3): qty 1

## 2018-10-03 MED ORDER — BRIMONIDINE TARTRATE 0.15 % OP SOLN
1.0000 [drp] | Freq: Two times a day (BID) | OPHTHALMIC | Status: DC
  Administered 2018-10-03 – 2018-10-05 (×5): 1 [drp] via OPHTHALMIC
  Filled 2018-10-03: qty 5

## 2018-10-03 MED ORDER — SODIUM CHLORIDE 0.9 % IV SOLN
INTRAVENOUS | Status: DC
  Administered 2018-10-03 – 2018-10-04 (×2): via INTRAVENOUS

## 2018-10-03 MED ORDER — TACROLIMUS 0.5 MG PO CAPS
0.5000 mg | ORAL_CAPSULE | Freq: Every evening | ORAL | Status: DC
  Administered 2018-10-03 – 2018-10-04 (×2): 0.5 mg via ORAL
  Filled 2018-10-03 (×3): qty 1

## 2018-10-03 MED ORDER — ENOXAPARIN SODIUM 40 MG/0.4ML ~~LOC~~ SOLN
40.0000 mg | Freq: Every day | SUBCUTANEOUS | Status: DC
  Administered 2018-10-03 – 2018-10-04 (×3): 40 mg via SUBCUTANEOUS
  Filled 2018-10-03 (×3): qty 0.4

## 2018-10-03 MED ORDER — FLUOXETINE HCL 20 MG PO CAPS
40.0000 mg | ORAL_CAPSULE | Freq: Every day | ORAL | Status: DC
  Administered 2018-10-03 – 2018-10-05 (×3): 40 mg via ORAL
  Filled 2018-10-03 (×3): qty 2

## 2018-10-03 MED ORDER — VITAMIN B-12 100 MCG PO TABS
100.0000 ug | ORAL_TABLET | Freq: Every day | ORAL | Status: DC
  Administered 2018-10-03 – 2018-10-05 (×3): 100 ug via ORAL
  Filled 2018-10-03 (×3): qty 1

## 2018-10-03 MED ORDER — ALLOPURINOL 300 MG PO TABS
300.0000 mg | ORAL_TABLET | Freq: Every day | ORAL | Status: DC
  Administered 2018-10-03 – 2018-10-05 (×3): 300 mg via ORAL
  Filled 2018-10-03 (×3): qty 1

## 2018-10-03 MED ORDER — ACETAMINOPHEN 325 MG PO TABS
650.0000 mg | ORAL_TABLET | Freq: Four times a day (QID) | ORAL | Status: DC | PRN
  Administered 2018-10-03: 650 mg via ORAL
  Filled 2018-10-03: qty 2

## 2018-10-03 MED ORDER — MOMETASONE FURO-FORMOTEROL FUM 200-5 MCG/ACT IN AERO
2.0000 | INHALATION_SPRAY | Freq: Two times a day (BID) | RESPIRATORY_TRACT | Status: DC
  Administered 2018-10-03 – 2018-10-05 (×5): 2 via RESPIRATORY_TRACT
  Filled 2018-10-03: qty 8.8

## 2018-10-03 MED ORDER — PANTOPRAZOLE SODIUM 40 MG PO TBEC: 40.0000 mg | DELAYED_RELEASE_TABLET | Freq: Two times a day (BID) | ORAL | Status: DC | PRN

## 2018-10-03 MED ORDER — ASPIRIN EC 81 MG PO TBEC
81.0000 mg | DELAYED_RELEASE_TABLET | Freq: Every day | ORAL | Status: DC
  Administered 2018-10-03 – 2018-10-05 (×3): 81 mg via ORAL
  Filled 2018-10-03 (×3): qty 1

## 2018-10-03 MED ORDER — HYDROCORTISONE NA SUCCINATE PF 100 MG IJ SOLR
50.0000 mg | Freq: Three times a day (TID) | INTRAMUSCULAR | Status: DC
  Administered 2018-10-03 – 2018-10-04 (×5): 50 mg via INTRAVENOUS
  Filled 2018-10-03 (×5): qty 2

## 2018-10-03 MED ORDER — SODIUM CHLORIDE 0.9 % IV SOLN
1.0000 g | INTRAVENOUS | Status: DC
  Administered 2018-10-03 – 2018-10-04 (×2): 1 g via INTRAVENOUS
  Filled 2018-10-03 (×2): qty 1

## 2018-10-03 MED ORDER — ONDANSETRON HCL 4 MG PO TABS: 4.0000 mg | ORAL_TABLET | Freq: Four times a day (QID) | ORAL | Status: DC | PRN

## 2018-10-03 MED ORDER — ALBUTEROL SULFATE (2.5 MG/3ML) 0.083% IN NEBU: 2.5000 mg | INHALATION_SOLUTION | RESPIRATORY_TRACT | Status: DC | PRN

## 2018-10-03 NOTE — Progress Notes (Signed)
PROGRESS NOTE    April [name]  NKN:[id number] DOB: Sep 08, 1956 DOA: 10/02/2018 PCP: [name], MD    Brief Narrative:  62 year old female who presented with nausea, vomiting and diarrhea.  She does have significant past medical history of kidney transplant, heart failure, and asthma.  Reported nausea and vomiting positive fevers for last 3 days prior to hospitalization.  On her initial physical examination blood pressure 133/53, heart rate 103, respirate 231, oxygen saturation 92%, is clear to auscultation bilaterally, heart S1-S2 present rhythmic, abdomen soft nontender, no lower extremity edema.  Sodium 134, potassium 4.3, chloride 100, bicarb 21, glucose 110, BUN 26, creatinine 1.0, lipase 28, AST 24, ALT 16, white count 14.8, hemoglobin 0.4, hematocrit 39.5, platelets 153.  D-dimer 2.5, urinalysis with 11-20 white cells, 0-5 red cells, chest radiograph, hyperinflation, right base atelectasis.  EKG normal sinus rhythm, normal axis, poor R wave progression.  Patient was admitted to the hospital with working diagnosis of sepsis due to urinary tract infection.   Assessment & Plan:   Principal Problem:   SIRS (systemic inflammatory response syndrome) (H[facility]) Active Problems:   Chronic diastolic CHF (congestive heart failure) ([facility])   Nausea vomiting and diarrhea   Lower urinary tract infectious disease   Asthma  1. Sepsis due to urinary tract infection (present on admission). Will continue antibiotic therapy with ceftriaxone IV, will continue to follow on cell count, cultures and temperature curve. Continue gentle IV fluids with isotonics saline.   2. Diastolic heart failure. Stable with no signs of exacerbation, will continue gentle hydration with isotonic saline.   3. SP renal transplant. Continue immunosuppressive therapy, close follow up of renal function and electrolytes. Avoid hypotension and nephrotoxic medications.   4. Depression.  Continue fluoxetine.   5. Asthma. No  signs of exacerbation, continue with dulera.   6. Elevated D dimer. Will check lower extremities [place].   DVT prophylaxis: enoxaparin   Code Status: full Family Communication: no family at the bedside  Disposition Plan/ discharge barriers: pending clinical improvement   Body mass index is 35.91 kg/m. Malnutrition Type:      Malnutrition Characteristics:      Nutrition Interventions:     RN Pressure Injury Documentation:     Consultants:     Procedures:     Antimicrobials:       Subjective: Patient is feeling better, positive sinus congestion, improved nausea or vomiting, but not back to baseline.   Objective: Vitals:   10/02/18 2300 10/03/18 0500 10/03/18 0502 10/03/18 0749  BP: 132/64  113/65   Pulse: 99  79   Resp: 20  20   Temp: 99.8 F (37.7 C)  97.7 F (36.5 C)   TempSrc: Oral  Oral   SpO2: 94%  97% 97%  Weight: 86.3 kg 86.2 kg    Height: 5\' 1"  (1.549 m)       Intake/Output Summary (Last 24 hours) at 10/03/2018 1525 Last data filed at 10/03/2018 3790 Gross per 24 hour  Intake 220 ml  Output 450 ml  Net -230 ml   Filed Weights   10/02/18 1855 10/02/18 2300 10/03/18 0500  Weight: 83.5 kg 86.3 kg 86.2 kg    Examination:   General: deconditioned  Neurology: Awake and alert, non focal  E ENT: mild pallor, no icterus, oral mucosa moist Cardiovascular: No JVD. S1-S2 present, rhythmic, no gallops, rubs, or murmurs. No lower extremity edema. Pulmonary: decreased breath sounds bilaterally, adequate air movement, no wheezing, rhonchi or rales. Gastrointestinal. Abdomen distended with no  organomegaly, non tender, no rebound or guarding Skin. No rashes Musculoskeletal: no joint deformities     Data Reviewed: I have personally reviewed following labs and imaging studies  CBC: Recent Labs  Lab 10/02/18 1929 10/03/18 0404  WBC 22.7* 14.8*  NEUTROABS  --  13.8*  HGB 13.2 11.4*  HCT 45.1 39.5  MCV 95.8 98.5  PLT 198 025   Basic  Metabolic Panel: Recent Labs  Lab 10/02/18 2021 10/03/18 0117 10/03/18 0404  NA 134*  --  137  K 4.3  --  5.3*  CL 100  --  107  CO2 21*  --  22  GLUCOSE 110*  --  138*  BUN 26*  --  26*  CREATININE 1.09*  --  1.14*  CALCIUM 9.2  --  8.5*  MG  --  2.1  --    GFR: Estimated Creatinine Clearance: 51.7 mL/min (A) (by C-G formula based on SCr of 1.14 mg/dL (H)). Liver Function Tests: Recent Labs  Lab 10/02/18 2021 10/03/18 0404  AST 24 19  ALT 16 15  ALKPHOS 88 73  BILITOT 4.5* 3.1*  PROT 7.6 6.9  ALBUMIN 3.3* 2.8*   Recent Labs  Lab 10/02/18 2021  LIPASE 28   No results for input(s): AMMONIA in the last 168 hours. Coagulation Profile: No results for input(s): INR, PROTIME in the last 168 hours. Cardiac Enzymes: Recent Labs  Lab 10/02/18 1929 10/03/18 0117 10/03/18 0643 10/03/18 1233  TROPONINI <0.03 <0.03 <0.03 <0.03   BNP (last 3 results) No results for input(s): PROBNP in the last 8760 hours. HbA1C: No results for input(s): HGBA1C in the last 72 hours. CBG: No results for input(s): GLUCAP in the last 168 hours. Lipid Profile: No results for input(s): CHOL, HDL, LDLCALC, TRIG, CHOLHDL, LDLDIRECT in the last 72 hours. Thyroid Function Tests: Recent Labs    10/03/18 0117  TSH 0.777   Anemia Panel: No results for input(s): VITAMINB12, FOLATE, FERRITIN, TIBC, IRON, RETICCTPCT in the last 72 hours.    Radiology Studies: I have reviewed all of the imaging during this hospital visit personally     Scheduled Meds: . allopurinol  300 mg Oral Daily  . aspirin EC  81 mg Oral Daily  . brimonidine  1 drop Left Eye BID  . cycloSPORINE  1 drop Both Eyes BID  . enoxaparin (LOVENOX) injection  40 mg Subcutaneous QHS  . FLUoxetine  40 mg Oral Daily  . fluticasone  1 spray Each Nare Daily  . hydrocortisone sod succinate (SOLU-CORTEF) inj  50 mg Intravenous Q8H  . latanoprost  1 drop Both Eyes QHS  . magnesium oxide  400 mg Oral BID  .  mometasone-formoterol  2 puff Inhalation BID  . mycophenolate  180 mg Oral BID  . tacrolimus  0.5 mg Oral QPM  . tacrolimus  1 mg Oral q morning - 10a  . vitamin B-12  100 mcg Oral Daily   Continuous Infusions: . sodium chloride    . cefTRIAXone (ROCEPHIN)  IV       LOS: 0 days        Tawni Millers, MD [facility] Pager [phone]

## 2018-10-03 NOTE — H&P (Addendum)
History and Physical    April [name]:[id number] DOB: 08/26/1956 DOA: 10/02/2018  PCP: [name], MD  Patient coming from: Home.  Chief Complaint: Nausea vomiting and diarrhea.  HPI: April [name] is a 62 y.o. female with history of renal transplant, CHF, asthma presents to the ER with complaints of nausea vomiting and diarrhea with some fever last 3 days.  Patient states she has been on antibiotics recently but does not recall what she was on before.  Denies any recent travel or sick contacts.  Denies any abdominal pain.  Both of vomiting and diarrhea have was clear.  Denies any blood in it.  ED Course: While in the ER patient was mildly short of breath.  After nebulizer treatment patient shortness of breath improved chest x-ray was unremarkable mildly febrile WBC count was 22,000 lactate was 2.1 heart rate was 111.  Patient was given fluid bolus for possible sepsis.  UA showing features concerning for UTI and was placed on antibiotics.  At the time of my exam patient is not in distress.  Last diarrhea was almost 7 hours ago.  Abdomen appears benign on my exam.  Review of Systems: As per HPI, rest all negative.   Past Medical History:  Diagnosis Date  . Arthritis   . Asthma   . Depression   . GERD (gastroesophageal reflux disease)   . Hyperlipidemia   . Hyperparathyroidism   . Polycystic kidney   . PONV (postoperative nausea and vomiting)     Past Surgical History:  Procedure Laterality Date  . 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 BILATERAL OOPHORECTOMY    . CARPAL TUNNEL RELEASE  2000  . CESAREAN SECTION    . INSERTION OF DIALYSIS CATHETER  03/31/2012   Procedure: INSERTION OF DIALYSIS CATHETER;  Surgeon: Angelia Mould, MD;  Location: [facility];  Service: Vascular;  Laterality: N/A;  insertion of dialysis catheter right internal jugular vein  . 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 TONSILLECTOMY  1967  . TUBAL LIGATION  2010      reports that she quit smoking about 20 years ago. Her smoking use included cigarettes. She has a 3.75 pack-year smoking history. She has never used smokeless tobacco. She reports that she does not drink alcohol or use drugs.  Allergies  Allergen Reactions  . Infed [Iron Dextran] Other (See Comments)    Chest tightness  . Pentamidine Itching, Shortness Of Breath and Swelling  . Erythromycin [Erythromycin] Other (See Comments)    Mouth Ulcers  . Oxycodone Nausea Only and Nausea And Vomiting  . Erythromycin Rash    Causes breakout in mouth  . Ultram [Tramadol Hcl] Anxiety    Family History  Problem Relation Age of Onset  . Hypertension Mother   . Heart disease Father        CABG history  . Polycystic kidney disease Father   . Polycystic kidney disease Brother     Prior to Admission medications   Medication Sig Start Date End Date Taking? Authorizing Provider  albuterol (PROVENTIL HFA;VENTOLIN HFA) 108 (90 Base) MCG/ACT inhaler Inhale 2 puffs into the lungs every 4 (four) hours as needed for wheezing or shortness of breath. 07/16/16   Ghimire, Henreitta Leber, MD  allopurinol (ZYLOPRIM) 300 MG tablet Take 300 mg by mouth daily.    [provider]  aspirin EC 81 MG tablet Take 81 mg by mouth daily.    [provider]  Azelastine-Fluticasone (DYMISTA) 137-50 MCG/ACT SUSP Place 1 spray into the nose 2 (two) times daily. Patient taking differently: Place 1 spray 2 (two) times daily as needed into the nose (alergies).  08/26/16   Mannam, Hart Robinsons, MD  brimonidine (ALPHAGAN P) 0.1 % SOLN Place 1 drop 2 (two) times daily into the left eye.    [provider]  budesonide-formoterol (SYMBICORT) 160-4.5 MCG/ACT inhaler Inhale 2 puffs 2 (two) times daily as needed into the lungs (SOB).     [provider]  cycloSPORINE (RESTASIS) 0.05 % ophthalmic emulsion Place 1 drop into both eyes 2 (two) times daily.    [provider]  fexofenadine (ALLEGRA) 30  MG/5ML suspension Take 30 mg by mouth daily as needed (for allergies).     [provider]  FLUoxetine (PROZAC) 40 MG capsule Take 40 mg by mouth daily.    [provider]  furosemide (LASIX) 40 MG tablet Take 1 tablet by mouth daily. 04/20/14   [provider]  HYDROmorphone (DILAUDID) 2 MG tablet Take 2 mg by mouth every 4 (four) hours as needed. 06/04/16   [provider]  magnesium oxide (MAG-OX) 400 MG tablet Take 400 mg by mouth 2 (two) times daily.    [provider]  mycophenolate (MYFORTIC) 180 MG EC tablet Take 180 mg by mouth 2 (two) times daily.    [provider]  pantoprazole (PROTONIX) 40 MG tablet Take 40 mg 2 (two) times daily as needed by mouth (acid reflux).     [provider]  prednisoLONE acetate (PRED FORTE) 1 % ophthalmic suspension Place 1 drop See admin instructions into both eyes. 1 drop left eye four times daily. 1 drop right eye once daily.    [provider]  predniSONE (DELTASONE) 10 MG tablet Take 3 tablets (30 mg total) daily by mouth. Take 3 tablets (30 mg ) on 11/11, then 20 mg (2 tablets) on 11/12 and then 1 tablet (20 mg ) on 11/13. Then, starting 11/14 resume 7.5 mg daily dose 10/02/17   Robbie Lis, MD  tacrolimus (PROGRAF) 0.5 MG capsule Take 0.5 mg by mouth every evening.    [provider]  tacrolimus (PROGRAF) 1 MG capsule Take 1 mg by mouth every morning.     [provider]  TRAVATAN Z 0.004 % SOLN ophthalmic solution Place 1 drop into both eyes at bedtime. 07/03/16   [provider]  vitamin B-12 (CYANOCOBALAMIN) 100 MCG tablet Take 100 mcg by mouth daily.    [provider]    Physical Exam: Vitals:   10/02/18 2113 10/02/18 2130 10/02/18 2200 10/02/18 2300  BP:  (!) 133/53 (!) 120/59 132/64  Pulse:  (!) 103 (!) 101 99  Resp:  (!) 25 (!) 31 20  Temp:    99.8 F (37.7 C)  TempSrc:    Oral  SpO2: 95% 94% 92% 94%  Weight:    87.9 kg  Height:     5\' 1"  (1.549 m)      Constitutional: Moderately built and nourished. Vitals:   10/02/18 2113 10/02/18 2130 10/02/18 2200 10/02/18 2300  BP:  (!) 133/53 (!) 120/59 132/64  Pulse:  (!) 103 (!) 101 99  Resp:  (!) 25 (!) 31 20  Temp:    99.8 F (37.7 C)  TempSrc:    Oral  SpO2: 95% 94% 92% 94%  Weight:    87.9 kg  Height:    5\' 1"  (1.549 m)   Eyes: Anicteric no  pallor. ENMT: No discharge from the ears eyes nose or mouth. Neck: No mass or.  No neck rigidity.  No JVD appreciated. Respiratory: No rhonchi or crepitations. Cardiovascular: S1-S2 heard no murmurs appreciated. Abdomen: Soft nontender bowel sounds present. Musculoskeletal: No edema.  No joint effusion. Skin: No rash. Neurologic: Alert awake oriented to time place and person.  Moves all extremities. Psychiatric: Appears normal per normal affect.   Labs on Admission: I have personally reviewed following labs and imaging studies  CBC: Recent Labs  Lab 10/02/18 1929  WBC 22.7*  HGB 13.2  HCT 45.1  MCV 95.8  PLT 086   Basic Metabolic Panel: Recent Labs  Lab 10/02/18 2021  NA 134*  K 4.3  CL 100  CO2 21*  GLUCOSE 110*  BUN 26*  CREATININE 1.09*  CALCIUM 9.2   GFR: Estimated Creatinine Clearance: 54.6 mL/min (A) (by C-G formula based on SCr of 1.09 mg/dL (H)). Liver Function Tests: Recent Labs  Lab 10/02/18 2021  AST 24  ALT 16  ALKPHOS 88  BILITOT 4.5*  PROT 7.6  ALBUMIN 3.3*   Recent Labs  Lab 10/02/18 2021  LIPASE 28   No results for input(s): AMMONIA in the last 168 hours. Coagulation Profile: No results for input(s): INR, PROTIME in the last 168 hours. Cardiac Enzymes: Recent Labs  Lab 10/02/18 1929  TROPONINI <0.03   BNP (last 3 results) No results for input(s): PROBNP in the last 8760 hours. HbA1C: No results for input(s): HGBA1C in the last 72 hours. CBG: No results for input(s): GLUCAP in the last 168 hours. Lipid Profile: No results for input(s): CHOL, HDL, LDLCALC,  TRIG, CHOLHDL, LDLDIRECT in the last 72 hours. Thyroid Function Tests: No results for input(s): TSH, T4TOTAL, FREET4, T3FREE, THYROIDAB in the last 72 hours. Anemia Panel: No results for input(s): VITAMINB12, FOLATE, FERRITIN, TIBC, IRON, RETICCTPCT in the last 72 hours. Urine analysis:    Component Value Date/Time   COLORURINE ORANGE (A) 10/02/2018 1929   APPEARANCEUR CLOUDY (A) 10/02/2018 1929   LABSPEC 1.025 10/02/2018 1929   PHURINE 5.5 10/02/2018 1929   GLUCOSEU NEGATIVE 10/02/2018 1929   HGBUR MODERATE (A) 10/02/2018 1929   BILIRUBINUR MODERATE (A) 10/02/2018 1929   KETONESUR 15 (A) 10/02/2018 1929   PROTEINUR 100 (A) 10/02/2018 1929   UROBILINOGEN 0.2 05/15/2013 0733   NITRITE POSITIVE (A) 10/02/2018 1929   LEUKOCYTESUR MODERATE (A) 10/02/2018 1929   Sepsis Labs: @LABRCNTIP (procalcitonin:4,lacticidven:4) ) Recent Results (from the past 240 hour(s))  Blood Culture (routine x 2)     Status: None (Preliminary result)   Collection Time: 10/02/18  7:29 PM  Result Value Ref Range Status   Specimen Description BLOOD RIGHT HAND  Final   Special Requests   Final    Immunocompromised Blood Culture adequate volume BOTTLES DRAWN AEROBIC AND ANAEROBIC Performed at [facility], [address] [place], [place] [address]    Culture PENDING  Incomplete   Report Status PENDING  Incomplete     Radiological Exams on Admission: Dg Chest Port 1 View  Result Date: 10/02/2018 CLINICAL DATA:  Shortness of breath, fever for 2 days. EXAM: PORTABLE CHEST 1 VIEW COMPARISON:  Chest x-rays dated 08/30/2018 and 07/15/2016. FINDINGS: Study is hypoinspiratory with crowding of the perihilar and bibasilar bronchovascular markings. Given the low lung volumes, lungs appear clear. No evidence of pneumonia. No pleural effusion or pneumothorax seen. Heart size and mediastinal contours are stable. IMPRESSION: Low lung volumes. No active disease. No evidence of pneumonia or  pulmonary edema.  Electronically Signed   By: Franki Cabot M.D.   On: 10/02/2018 20:00    EKG: Independently reviewed.  Sinus tachycardia.  Assessment/Plan Principal Problem:   SIRS (systemic inflammatory response syndrome) (HCC) Active Problems:   Chronic diastolic CHF (congestive heart failure) (HCC)   Nausea vomiting and diarrhea   Lower urinary tract infectious disease   Asthma    1. SIRS secondary nausea vomiting in a.m. possible UTI -patient received sepsis protocol fluids.  Will follow lactate procalcitonin and blood cultures and urine cultures.  Patient has been empirically started on cefepime and metronidazole for UTI.  Since patient has had recent exposure to antibiotics will check stool for C. difficile given the markedly elevated leukocytosis. 2. Shortness of breath history of asthma and CHF -patient's shortness of breath improved with nebulizer which we will keep patient on PRN as needed.  Continue Symbicort.  Holding Lasix due to dehydration.  BNP is mildly elevated at 300.  Will check d-dimer.  Patient's last 2D echo done in 2011 was showing EF of 60 to 65% with grade 2 diastolic dysfunction. 3. History of renal transplant on Prograf mycophenolate and prednisone.  Since patient may not have reliable oral intake of prednisone I have kept patient on stress dose steroids. 4. History of depression continue Prozac. 5. Patient's total bilirubin is elevated but AST ALT are normal.  Abdomen appears benign.  Follow LFTs. 6. History of gout on allopurinol.   DVT prophylaxis: Lovenox. Code Status: Full code. Family Communication: Discussed with patient. Disposition Plan: Home. Consults called: None. Admission status: Observation.   Rise Patience MD [facility] Pager [phone].  If 7PM-7AM, please contact night-coverage [email or web address] Password [email or web address]  10/03/2018, 12:49 AM

## 2018-10-03 NOTE — Progress Notes (Signed)
LE venous duplex prelim: negative for DVT. April [name] Eunice, RDMS, RVT  

## 2018-10-03 NOTE — Progress Notes (Addendum)
PROGRESS NOTE    April [name]  MPN:[id number] DOB: 1956/11/17 DOA: 10/02/2018 PCP: [name], MD (Confirm with patient/family/NH records and if not entered, this HAS to be entered at [facility] point of entry. "No PCP" if truly none.) Outpatient Specialists: (Specify speciality and name if known)    Brief Narrative: April [name] is 62 y.o admitted with SIRS and possible UTI. Has a history of kidney transplant in 2013, CHF, and asthma. Patient presented with three days of nausea, vomiting, diarrhea, and SOB. Also, reported use of AA about a month ago. In the ED patient was febrile, tachycardic, tachypneic, and SOB. Placed on breathing treatment and admitted to the hospital with SIRS criteria. Chest x-ray is unremarkable. UA is positive for UTI.    Assessment & Plan:   Principal Problem:   SIRS (systemic inflammatory response syndrome) (HCC) Active Problems:   Chronic diastolic CHF (congestive heart failure) (HCC)   Nausea vomiting and diarrhea   Lower urinary tract infectious disease   Asthma  Sepsis: Secondary to UTI. Patient is afibrile, not tachycardic or tachypneic. N/V/D has resolved. WBC is trending down, today 14. Will discontinue metronidazole as there is no indication for it. Started on ceftriaxone for UTI treatment and Zoferen IV for nausea PRN. Urine and blood culture pending.   SOB with a history of Asthma: Patient breathing improved with neb treatment. Continue on Dulera inhaler and  albuterol 2.5 mg every 2 hours PRN. Added Flonase spray for sinus infection.   Chronic diastolic CHF: Echo done in 2011 shows EF of 60 to 65% with grade 2 diastolic dysfunction. BNP is elevated at 309. D-dimer is also elevated at 2.52 . Will order [place] to evaluate for DVT.    History of kidney transplant: Continue on Progra, mycophenolate and prednisone. Creatinine is slowly trending up, 1.09-->1.14. Placed on IV fluids.   History of depression: Continue Prozac 40 mg PO daily.   History  of gout: Continue on allopurinol  GERD: no acute symptom continue on Protonix 40 mg PO BID, PRN   DVT prophylaxis: Lovenox Code Status: Full Family Communication: No family present at bedside.  Disposition Plan:  Consultants: None    Procedures: Ultrasound  Antimicrobials: Cefepime Subjective: Patient indicated that her breathing has improved. No nausea, vomiting, and diarrhea since admission. Denied chest pain and abdominal pain.   Objective: Vitals:   10/02/18 2300 10/03/18 0500 10/03/18 0502 10/03/18 0749  BP: 132/64  113/65   Pulse: 99  79   Resp: 20  20   Temp: 99.8 F (37.7 C)  97.7 F (36.5 C)   TempSrc: Oral  Oral   SpO2: 94%  97% 97%  Weight: 86.3 kg 86.2 kg    Height: 5\' 1"  (1.549 m)       Intake/Output Summary (Last 24 hours) at 10/03/2018 1111 Last data filed at 10/03/2018 0625 Gross per 24 hour  Intake 220 ml  Output 450 ml  Net -230 ml   Filed Weights   10/02/18 1855 10/02/18 2300 10/03/18 0500  Weight: 83.5 kg 86.3 kg 86.2 kg    Examination:  General exam: deconditioned Respiratory system: Clear to auscultation. Respiratory effort normal. Cardiovascular system: S1 & S2 heard, RRR. No JVD, murmurs, rubs, gallops or clicks. No pedal edema. Gastrointestinal system: Abdomen is nondistended, soft and nontender. No organomegaly or masses felt. Normal bowel sounds heard. Central nervous system: Alert and oriented. No focal neurological deficits. Extremities: Symmetric 5 x 5 power. Skin: No rashes, lesions or ulcers Psychiatry: Judgement and insight  appear normal. Mood & affect appropriate.     Data Reviewed: I have personally reviewed following labs and imaging studies  CBC: Recent Labs  Lab 10/02/18 1929 10/03/18 0404  WBC 22.7* 14.8*  NEUTROABS  --  13.8*  HGB 13.2 11.4*  HCT 45.1 39.5  MCV 95.8 98.5  PLT 198 629   Basic Metabolic Panel: Recent Labs  Lab 10/02/18 2021 10/03/18 0117 10/03/18 0404  NA 134*  --  137  K 4.3  --  5.3*   CL 100  --  107  CO2 21*  --  22  GLUCOSE 110*  --  138*  BUN 26*  --  26*  CREATININE 1.09*  --  1.14*  CALCIUM 9.2  --  8.5*  MG  --  2.1  --    GFR: Estimated Creatinine Clearance: 51.7 mL/min (A) (by C-G formula based on SCr of 1.14 mg/dL (H)). Liver Function Tests: Recent Labs  Lab 10/02/18 2021 10/03/18 0404  AST 24 19  ALT 16 15  ALKPHOS 88 73  BILITOT 4.5* 3.1*  PROT 7.6 6.9  ALBUMIN 3.3* 2.8*   Recent Labs  Lab 10/02/18 2021  LIPASE 28   No results for input(s): AMMONIA in the last 168 hours. Coagulation Profile: No results for input(s): INR, PROTIME in the last 168 hours. Cardiac Enzymes: Recent Labs  Lab 10/02/18 1929 10/03/18 0117 10/03/18 0643  TROPONINI <0.03 <0.03 <0.03   BNP (last 3 results) No results for input(s): PROBNP in the last 8760 hours. HbA1C: No results for input(s): HGBA1C in the last 72 hours. CBG: No results for input(s): GLUCAP in the last 168 hours. Lipid Profile: No results for input(s): CHOL, HDL, LDLCALC, TRIG, CHOLHDL, LDLDIRECT in the last 72 hours. Thyroid Function Tests: Recent Labs    10/03/18 0117  TSH 0.777   Anemia Panel: No results for input(s): VITAMINB12, FOLATE, FERRITIN, TIBC, IRON, RETICCTPCT in the last 72 hours. Urine analysis:    Component Value Date/Time   COLORURINE ORANGE (A) 10/02/2018 1929   APPEARANCEUR CLOUDY (A) 10/02/2018 1929   LABSPEC 1.025 10/02/2018 1929   PHURINE 5.5 10/02/2018 1929   GLUCOSEU NEGATIVE 10/02/2018 1929   HGBUR MODERATE (A) 10/02/2018 1929   BILIRUBINUR MODERATE (A) 10/02/2018 1929   KETONESUR 15 (A) 10/02/2018 1929   PROTEINUR 100 (A) 10/02/2018 1929   UROBILINOGEN 0.2 05/15/2013 0733   NITRITE POSITIVE (A) 10/02/2018 1929   LEUKOCYTESUR MODERATE (A) 10/02/2018 1929   Sepsis Labs: @LABRCNTIP (procalcitonin:4,lacticidven:4)  ) Recent Results (from the past 240 hour(s))  Blood Culture (routine x 2)     Status: None (Preliminary result)   Collection Time:  10/02/18  7:29 PM  Result Value Ref Range Status   Specimen Description BLOOD RIGHT HAND  Final   Special Requests   Final    Immunocompromised Blood Culture adequate volume BOTTLES DRAWN AEROBIC AND ANAEROBIC Performed at [facility], [address] [place], [place] [address]    Culture   Final    NO GROWTH < 12 HOURS Performed at [facility], [place] [address], [place], [place] [address]    Report Status PENDING  Incomplete  Blood Culture (routine x 2)     Status: None (Preliminary result)   Collection Time: 10/02/18  8:15 PM  Result Value Ref Range Status   Specimen Description   Final    BLOOD RIGHT FOREARM Performed at [facility], [address], [place],  [address]  Special Requests   Final    BOTTLES DRAWN AEROBIC AND ANAEROBIC Blood Culture adequate volume Performed at [facility], [address] [place], [place] [address]    Culture   Final    NO GROWTH < 12 HOURS Performed at [facility] [address] [place], [place] [address]    Report Status PENDING  Incomplete         Radiology Studies: Dg Chest Port 1 View  Result Date: 10/02/2018 CLINICAL DATA:  Shortness of breath, fever for 2 days. EXAM: PORTABLE CHEST 1 VIEW COMPARISON:  Chest x-rays dated 08/30/2018 and 07/15/2016. FINDINGS: Study is hypoinspiratory with crowding of the perihilar and bibasilar bronchovascular markings. Given the low lung volumes, lungs appear clear. No evidence of pneumonia. No pleural effusion or pneumothorax seen. Heart size and mediastinal contours are stable. IMPRESSION: Low lung volumes. No active disease. No evidence of pneumonia or pulmonary edema. Electronically Signed   By: Franki Cabot M.D.   On: 10/02/2018 20:00        Scheduled Meds: . allopurinol  300 mg Oral Daily  . aspirin EC  81 mg Oral Daily  . brimonidine  1 drop Left Eye BID  . cycloSPORINE  1 drop Both Eyes BID  . enoxaparin (LOVENOX) injection  40  mg Subcutaneous QHS  . FLUoxetine  40 mg Oral Daily  . fluticasone  1 spray Each Nare Daily  . hydrocortisone sod succinate (SOLU-CORTEF) inj  50 mg Intravenous Q8H  . latanoprost  1 drop Both Eyes QHS  . magnesium oxide  400 mg Oral BID  . mometasone-formoterol  2 puff Inhalation BID  . mycophenolate  180 mg Oral BID  . tacrolimus  0.5 mg Oral QPM  . tacrolimus  1 mg Oral q morning - 10a  . vitamin B-12  100 mcg Oral Daily   Continuous Infusions: . sodium chloride    . ceFEPime (MAXIPIME) IV 2 g (10/03/18 1038)     LOS: 0 days    Time spent:     Sterling Big, MD [facility] Pager [phone]  If 7PM-7AM, please contact night-coverage [email or web address] Password [email or web address] 10/03/2018, 11:11 AM

## 2018-10-03 NOTE — Progress Notes (Signed)
Resumed care of this patient at 2300. I agree with previous RN's assessment. Pt resting at this time. Will continue to monitor.

## 2018-10-04 ENCOUNTER — Inpatient Hospital Stay (HOSPITAL_COMMUNITY): Payer: Medicare Other

## 2018-10-04 DIAGNOSIS — R0602 Shortness of breath: Secondary | ICD-10-CM

## 2018-10-04 LAB — CBC WITH DIFFERENTIAL/PLATELET
Abs Immature Granulocytes: 0.08 10*3/uL — ABNORMAL HIGH (ref 0.00–0.07)
Basophils Absolute: 0 10*3/uL (ref 0.0–0.1)
Basophils Relative: 0 %
Eosinophils Absolute: 0 10*3/uL (ref 0.0–0.5)
Eosinophils Relative: 0 %
HEMATOCRIT: 37 % (ref 36.0–46.0)
HEMOGLOBIN: 10.5 g/dL — AB (ref 12.0–15.0)
Immature Granulocytes: 1 %
LYMPHS PCT: 6 %
Lymphs Abs: 0.6 10*3/uL — ABNORMAL LOW (ref 0.7–4.0)
MCH: 28.6 pg (ref 26.0–34.0)
MCHC: 28.4 g/dL — AB (ref 30.0–36.0)
MCV: 100.8 fL — AB (ref 80.0–100.0)
MONO ABS: 0.4 10*3/uL (ref 0.1–1.0)
Monocytes Relative: 4 %
Neutro Abs: 8.8 10*3/uL — ABNORMAL HIGH (ref 1.7–7.7)
Neutrophils Relative %: 89 %
Platelets: 141 10*3/uL — ABNORMAL LOW (ref 150–400)
RBC: 3.67 MIL/uL — AB (ref 3.87–5.11)
RDW: 17.9 % — ABNORMAL HIGH (ref 11.5–15.5)
WBC: 9.9 10*3/uL (ref 4.0–10.5)
nRBC: 0 % (ref 0.0–0.2)

## 2018-10-04 LAB — COMPREHENSIVE METABOLIC PANEL
ALT: 14 U/L (ref 0–44)
AST: 16 U/L (ref 15–41)
Albumin: 2.5 g/dL — ABNORMAL LOW (ref 3.5–5.0)
Alkaline Phosphatase: 60 U/L (ref 38–126)
Anion gap: 7 (ref 5–15)
BUN: 36 mg/dL — AB (ref 8–23)
CHLORIDE: 109 mmol/L (ref 98–111)
CO2: 22 mmol/L (ref 22–32)
CREATININE: 1.16 mg/dL — AB (ref 0.44–1.00)
Calcium: 8.7 mg/dL — ABNORMAL LOW (ref 8.9–10.3)
GFR calc Af Amer: 58 mL/min — ABNORMAL LOW (ref >60)
GFR, EST NON AFRICAN AMERICAN: 50 mL/min — AB (ref >60)
Glucose, Bld: 127 mg/dL — ABNORMAL HIGH (ref 70–99)
Potassium: 4.3 mmol/L (ref 3.5–5.1)
Sodium: 138 mmol/L (ref 135–145)
Total Bilirubin: 0.9 mg/dL (ref 0.3–1.2)
Total Protein: 6.3 g/dL — ABNORMAL LOW (ref 6.5–8.1)

## 2018-10-04 LAB — URINE CULTURE

## 2018-10-04 MED ORDER — LORAZEPAM 1 MG PO TABS
1.0000 mg | ORAL_TABLET | Freq: Once | ORAL | Status: AC
  Administered 2018-10-04: 1 mg via ORAL
  Filled 2018-10-04: qty 1

## 2018-10-04 MED ORDER — PREDNISONE 5 MG PO TABS
7.5000 mg | ORAL_TABLET | Freq: Every day | ORAL | Status: DC
  Administered 2018-10-05: 7.5 mg via ORAL
  Filled 2018-10-04: qty 2

## 2018-10-04 MED ORDER — TECHNETIUM TC 99M DIETHYLENETRIAME-PENTAACETIC ACID
31.7000 | Freq: Once | INTRAVENOUS | Status: AC | PRN
  Administered 2018-10-04: 31.7 via RESPIRATORY_TRACT

## 2018-10-04 MED ORDER — TECHNETIUM TO 99M ALBUMIN AGGREGATED
4.4000 | Freq: Once | INTRAVENOUS | Status: AC | PRN
  Administered 2018-10-04: 4.4 via INTRAVENOUS

## 2018-10-04 NOTE — Progress Notes (Signed)
PROGRESS NOTE    April [name]  AGT:[id number] DOB: 1956-05-03 DOA: 10/02/2018 PCP: [name], MD    Brief Narrative:  62 year old female who presented with nausea, vomiting and diarrhea.  She does have significant past medical history of kidney transplant, heart failure, and asthma.  Reported nausea and vomiting positive fevers for last 3 days prior to hospitalization.  On her initial physical examination blood pressure 133/53, heart rate 103, respirate 231, oxygen saturation 92%, is clear to auscultation bilaterally, heart S1-S2 present rhythmic, abdomen soft nontender, no lower extremity edema.  Sodium 134, potassium 4.3, chloride 100, bicarb 21, glucose 110, BUN 26, creatinine 1.0, lipase 28, AST 24, ALT 16, white count 14.8, hemoglobin 0.4, hematocrit 39.5, platelets 153.  D-dimer 2.5, urinalysis with 11-20 white cells, 0-5 red cells, chest radiograph, hyperinflation, right base atelectasis.  EKG normal sinus rhythm, normal axis, poor R wave progression.  Patient was admitted to the hospital with working diagnosis of sepsis due to urinary tract infection.   Assessment & Plan:   Principal Problem:   SIRS (systemic inflammatory response syndrome) (HCC) Active Problems:   Chronic diastolic CHF (congestive heart failure) (HCC)   Nausea vomiting and diarrhea   Lower urinary tract infectious disease   Asthma   1. Sepsis due to urinary tract infection (present on admission).  Continue antibiotic therapy with ceftriaxone IV, WBC at 9.9, cultures have been no growth. Will discontinue IV fluids.   2. Diastolic heart failure. No signs of exacerbation, will continue blood pressure monitoring, dc IV fluids.  3. SP renal transplant. Immunosuppressive therapy. Avoid hypotension and nephrotoxic medications. Follow on renal panel in am.   4. Depression.  On fluoxetine.   5. Asthma. No clinical signs of exacerbation. On dulera.   6. Elevated D dimer. Negative [place] lower extremities  for DVT, will proceed with pulmonary V/Q scan, will avoid contrast to avoid kidney injury (sp transplant).    7. CKD stage 3. Will continue close monitoring of renal function and electrolytes, avoid nephrotoxic medications and hypotension.    DVT prophylaxis: enoxaparin   Code Status: full Family Communication: no family at the bedside  Disposition Plan/ discharge barriers: pending clinical improvement   Body mass index is 36.78 kg/m. Malnutrition Type:      Malnutrition Characteristics:      Nutrition Interventions:     RN Pressure Injury Documentation:     Consultants:     Procedures:     Antimicrobials:   Ceftriaxone     Subjective: Patient is feeling better, not yet back to baseline, mild confusion, no chest pain, no dyspnea, no nausea or vomiting.   Objective: Vitals:   10/03/18 2105 10/04/18 0500 10/04/18 0646 10/04/18 0934  BP: 112/65  118/68   Pulse: 70  70   Resp: 18  18   Temp: 98.1 F (36.7 C)  98.3 F (36.8 C)   TempSrc: Oral     SpO2: 97%  100% 95%  Weight:  88.3 kg    Height:        Intake/Output Summary (Last 24 hours) at 10/04/2018 1215 Last data filed at 10/04/2018 0649 Gross per 24 hour  Intake 875.15 ml  Output 1550 ml  Net -674.85 ml   Filed Weights   10/02/18 2300 10/03/18 0500 10/04/18 0500  Weight: 86.3 kg 86.2 kg 88.3 kg    Examination:   General: deconditioned  Neurology: Awake and alert, non focal  E ENT: mild pallor, no icterus, oral mucosa moist Cardiovascular: No JVD. S1-S2  present, rhythmic, no gallops, rubs, or murmurs. Trace lower extremity edema. Pulmonary: positive breath sounds bilaterally, adequate air movement, no wheezing, rhonchi or rales. Gastrointestinal. Abdomen with no organomegaly, non tender, no rebound or guarding Skin. No rashes Musculoskeletal: no joint deformities     Data Reviewed: I have personally reviewed following labs and imaging studies  CBC: Recent Labs  Lab  10/02/18 1929 10/03/18 0404 10/04/18 0523  WBC 22.7* 14.8* 9.9  NEUTROABS  --  13.8* 8.8*  HGB 13.2 11.4* 10.5*  HCT 45.1 39.5 37.0  MCV 95.8 98.5 100.8*  PLT 198 153 017*   Basic Metabolic Panel: Recent Labs  Lab 10/02/18 2021 10/03/18 0117 10/03/18 0404 10/04/18 0523  NA 134*  --  137 138  K 4.3  --  5.3* 4.3  CL 100  --  107 109  CO2 21*  --  22 22  GLUCOSE 110*  --  138* 127*  BUN 26*  --  26* 36*  CREATININE 1.09*  --  1.14* 1.16*  CALCIUM 9.2  --  8.5* 8.7*  MG  --  2.1  --   --    GFR: Estimated Creatinine Clearance: 51.5 mL/min (A) (by C-G formula based on SCr of 1.16 mg/dL (H)). Liver Function Tests: Recent Labs  Lab 10/02/18 2021 10/03/18 0404 10/04/18 0523  AST 24 19 16   ALT 16 15 14   ALKPHOS 88 73 60  BILITOT 4.5* 3.1* 0.9  PROT 7.6 6.9 6.3*  ALBUMIN 3.3* 2.8* 2.5*   Recent Labs  Lab 10/02/18 2021  LIPASE 28   No results for input(s): AMMONIA in the last 168 hours. Coagulation Profile: No results for input(s): INR, PROTIME in the last 168 hours. Cardiac Enzymes: Recent Labs  Lab 10/02/18 1929 10/03/18 0117 10/03/18 0643 10/03/18 1233  TROPONINI <0.03 <0.03 <0.03 <0.03   BNP (last 3 results) No results for input(s): PROBNP in the last 8760 hours. HbA1C: No results for input(s): HGBA1C in the last 72 hours. CBG: No results for input(s): GLUCAP in the last 168 hours. Lipid Profile: No results for input(s): CHOL, HDL, LDLCALC, TRIG, CHOLHDL, LDLDIRECT in the last 72 hours. Thyroid Function Tests: Recent Labs    10/03/18 0117  TSH 0.777   Anemia Panel: No results for input(s): VITAMINB12, FOLATE, FERRITIN, TIBC, IRON, RETICCTPCT in the last 72 hours.    Radiology Studies: I have reviewed all of the imaging during this hospital visit personally     Scheduled Meds: . allopurinol  300 mg Oral Daily  . aspirin EC  81 mg Oral Daily  . brimonidine  1 drop Left Eye BID  . cycloSPORINE  1 drop Both Eyes BID  . enoxaparin  (LOVENOX) injection  40 mg Subcutaneous QHS  . FLUoxetine  40 mg Oral Daily  . fluticasone  1 spray Each Nare Daily  . hydrocortisone sod succinate (SOLU-CORTEF) inj  50 mg Intravenous Q8H  . latanoprost  1 drop Both Eyes QHS  . magnesium oxide  400 mg Oral BID  . mometasone-formoterol  2 puff Inhalation BID  . mycophenolate  180 mg Oral BID  . tacrolimus  0.5 mg Oral QPM  . tacrolimus  1 mg Oral q morning - 10a  . vitamin B-12  100 mcg Oral Daily   Continuous Infusions: . cefTRIAXone (ROCEPHIN)  IV 1 g (10/03/18 1849)     LOS: 1 day        Dustine Stickler Gerome Apley, MD [facility] Pager [phone]

## 2018-10-05 DIAGNOSIS — R6510 Systemic inflammatory response syndrome (SIRS) of non-infectious origin without acute organ dysfunction: Secondary | ICD-10-CM

## 2018-10-05 LAB — BASIC METABOLIC PANEL
ANION GAP: 9 (ref 5–15)
BUN: 43 mg/dL — ABNORMAL HIGH (ref 8–23)
CALCIUM: 8.8 mg/dL — AB (ref 8.9–10.3)
CO2: 21 mmol/L — ABNORMAL LOW (ref 22–32)
Chloride: 108 mmol/L (ref 98–111)
Creatinine, Ser: 1.17 mg/dL — ABNORMAL HIGH (ref 0.44–1.00)
GFR, EST AFRICAN AMERICAN: 57 mL/min — AB (ref >60)
GFR, EST NON AFRICAN AMERICAN: 49 mL/min — AB (ref >60)
GLUCOSE: 131 mg/dL — AB (ref 70–99)
Potassium: 4.2 mmol/L (ref 3.5–5.1)
SODIUM: 138 mmol/L (ref 135–145)

## 2018-10-05 LAB — CBC WITH DIFFERENTIAL/PLATELET
Abs Immature Granulocytes: 0.05 10*3/uL (ref 0.00–0.07)
BASOS ABS: 0 10*3/uL (ref 0.0–0.1)
Basophils Relative: 0 %
Eosinophils Absolute: 0 10*3/uL (ref 0.0–0.5)
Eosinophils Relative: 0 %
HCT: 35.4 % — ABNORMAL LOW (ref 36.0–46.0)
Hemoglobin: 9.9 g/dL — ABNORMAL LOW (ref 12.0–15.0)
IMMATURE GRANULOCYTES: 1 %
LYMPHS ABS: 0.8 10*3/uL (ref 0.7–4.0)
LYMPHS PCT: 11 %
MCH: 28.6 pg (ref 26.0–34.0)
MCHC: 28 g/dL — ABNORMAL LOW (ref 30.0–36.0)
MCV: 102.3 fL — AB (ref 80.0–100.0)
MONOS PCT: 5 %
Monocytes Absolute: 0.4 10*3/uL (ref 0.1–1.0)
NEUTROS PCT: 83 %
NRBC: 0 % (ref 0.0–0.2)
Neutro Abs: 5.9 10*3/uL (ref 1.7–7.7)
PLATELETS: 128 10*3/uL — AB (ref 150–400)
RBC: 3.46 MIL/uL — ABNORMAL LOW (ref 3.87–5.11)
RDW: 17.8 % — AB (ref 11.5–15.5)
WBC: 7.2 10*3/uL (ref 4.0–10.5)

## 2018-10-05 MED ORDER — PANTOPRAZOLE SODIUM 40 MG PO TBEC
40.0000 mg | DELAYED_RELEASE_TABLET | Freq: Every day | ORAL | Status: DC
  Administered 2018-10-05: 40 mg via ORAL
  Filled 2018-10-05: qty 1

## 2018-10-05 MED ORDER — CEPHALEXIN 500 MG PO CAPS: 500.0000 mg | ORAL_CAPSULE | Freq: Two times a day (BID) | ORAL | Status: DC

## 2018-10-05 MED ORDER — CEPHALEXIN 500 MG PO CAPS: 500.0000 mg | ORAL_CAPSULE | Freq: Two times a day (BID) | ORAL | 0 refills | Status: AC

## 2018-10-05 NOTE — Care Management Note (Signed)
Case Management Note  Patient Details  Name: April [name] MRN: [id number] Date of Birth: 04-Jan-1956  Subjective/Objective:     Pt admitted in with SIRS.                Action/Plan: to Plan to discharge home with no needs at present time.   Expected Discharge Date:  10/05/18               Expected Discharge Plan:  Home/Self Care  In-House Referral:     Discharge planning Services  CM Consult  Post Acute Care Choice:    Choice offered to:     DME Arranged:    DME Agency:     HH Arranged:    HH Agency:     Status of Service:  Completed, signed off  If discussed at [employer] of Stay Meetings, dates discussed:    Additional CommentsPurcell Mouton, RN 10/05/2018, 3:04 PM

## 2018-10-05 NOTE — Evaluation (Signed)
Physical Therapy Evaluation Patient Details Name: April [name] MRN: [id number] DOB: Sep 25, 1956 Today's Date: 10/05/2018   History of Present Illness  62 year old female who presented with nausea, vomiting and diarrhea.  She does have significant past medical history of kidney transplant, heart failure, and asthma.  Reported nausea and vomiting positive fevers   Clinical Impression  Patient evaluated by Physical Therapy with no further acute PT needs identified. All education has been completed and the patient has no further questions. See below for any follow-up Physical Therapy or equipment needs. PT is signing off. Thank you for this referral. Reviewed activity progression at home, pt's goal is to return to exercising in the pool     Follow Up Recommendations No PT follow up;Supervision for mobility/OOB    Equipment Recommendations  None recommended by PT    Recommendations for Other Services       Precautions / Restrictions Precautions Precautions: Fall      Mobility  Bed Mobility Overal bed mobility: Modified Independent                Transfers Overall transfer level: Modified independent               General transfer comment: from bed, toilet; cues for safety not physical assist  Ambulation/Gait Ambulation/Gait assistance: Supervision;Min guard Gait Distance (Feet): 240 Feet Assistive device: Rolling walker (2 wheeled) Gait Pattern/deviations: Step-through pattern;Decreased stride length;Wide base of support Gait velocity: decr   General Gait Details: ~10' without  device,min/guard to close supervision for safety;  pt with mild instability however improved with distance, no overt LOB, RW for safety  Stairs            Wheelchair Mobility    Modified Rankin (Stroke Patients Only)       Balance Overall balance assessment: Needs assistance   Sitting balance-Leahy Scale: Good     Standing balance support: No upper extremity  supported;During functional activity Standing balance-Leahy Scale: Fair Standing balance comment: pt able to perform toilet hygiene without UE support, wash hands with intermittent unilateral UE support; no LOB during gait with RW, unable to tol challenges             High level balance activites: Side stepping;Direction changes;Turns;Head turns High Level Balance Comments: no LOB with above             Pertinent Vitals/Pain Pain Assessment: No/denies pain    Home Living Family/patient expects to be discharged to:: Private residence Living Arrangements: Spouse/significant other;Parent;Children Available Help at Discharge: Family Type of Home: House Home Access: Ramped entrance     Home Layout: Two level;Able to live on main level with bedroom/bathroom Home Equipment: Gilford Rile - 2 wheels      Prior Function Level of Independence: Independent with assistive device(s)         Comments: amb with RW at times     Hand Dominance        Extremity/Trunk Assessment   Upper Extremity Assessment Upper Extremity Assessment: Overall WFL for tasks assessed    Lower Extremity Assessment Lower Extremity Assessment: Overall WFL for tasks assessed       Communication   Communication: No difficulties  Cognition Arousal/Alertness: Awake/alert Behavior During Therapy: WFL for tasks assessed/performed   Area of Impairment: Following commands                       Following Commands: Follows multi-step commands with increased time       General  Comments: mildly delayed processing at times      General Comments      Exercises     Assessment/Plan    PT Assessment Patent does not need any further PT services  PT Problem List         PT Treatment Interventions      PT Goals (Current goals can be found in the Care Plan section)  Acute Rehab PT Goals Patient Stated Goal: get back in the pool and exercise PT Goal Formulation: All assessment and  education complete, DC therapy    Frequency     Barriers to discharge        Co-evaluation               AM-PAC PT "6 Clicks" Daily Activity  Outcome Measure Difficulty turning over in bed (including adjusting bedclothes, sheets and blankets)?: A Little Difficulty moving from lying on back to sitting on the side of the bed? : A Little Difficulty sitting down on and standing up from a chair with arms (e.g., wheelchair, bedside commode, etc,.)?: A Little Help needed moving to and from a bed to chair (including a wheelchair)?: None Help needed walking in hospital room?: A Little Help needed climbing 3-5 steps with a railing? : A Little 6 Click Score: 19    End of Session Equipment Utilized During Treatment: Gait belt Activity Tolerance: Patient tolerated treatment well Patient left: with call bell/phone within reach;in bed;with bed alarm set   PT Visit Diagnosis: Unsteadiness on feet (R26.81)    Time: 0141-0301 PT Time Calculation (min) (ACUTE ONLY): 20 min   Charges:   PT Evaluation $PT Eval Low Complexity: 1 Low          Kenyon Ana, PT  Pager: [phone] Acute Rehab Dept [facility]): [phone]   10/05/2018   [facility] 10/05/2018, 2:11 PM

## 2018-10-05 NOTE — Discharge Summary (Signed)
Discharge Summary  April [name] ZDG:[id number] DOB: 11-Mar-1956  PCP: [name], MD  Admit date: 10/02/2018 Discharge date: 10/05/2018  Time spent: 54mins  Recommendations for Outpatient Follow-up:  1. F/u with PCP within a week  for hospital discharge follow up, repeat cbc/bmp at follow up 2. F/u with nephrology Dr Deterding   Discharge Diagnoses:  Active Hospital Problems   Diagnosis Date Noted  . SIRS (systemic inflammatory response syndrome) ([place]) 10/03/2018  . Lower urinary tract infectious disease 10/03/2018  . Asthma 10/03/2018  . Nausea vomiting and diarrhea 10/02/2018  . Chronic diastolic CHF (congestive heart failure) ([place]) 10/01/2017    Resolved Hospital Problems  No resolved problems to display.    Discharge Condition: stable  Diet recommendation: regular diet  Filed Weights   10/03/18 0500 10/04/18 0500 10/05/18 0511  Weight: 86.2 kg 88.3 kg 88.1 kg    History of present illness: (per admitting MD Dr Louanne Skye) PCP: [name], MD  Patient coming from: Home.  Chief Complaint: Nausea vomiting and diarrhea.  HPI: April [name] is a 62 y.o. female with history of renal transplant, CHF, asthma presents to the ER with complaints of nausea vomiting and diarrhea with some fever last 3 days.  Patient states she has been on antibiotics recently but does not recall what she was on before.  Denies any recent travel or sick contacts.  Denies any abdominal pain.  Both of vomiting and diarrhea have was clear.  Denies any blood in it.  ED Course: While in the ER patient was mildly short of breath.  After nebulizer treatment patient shortness of breath improved chest x-ray was unremarkable mildly febrile WBC count was 22,000 lactate was 2.1 heart rate was 111.  Patient was given fluid bolus for possible sepsis.  UA showing features concerning for UTI and was placed on antibiotics.  At the time of my exam patient is not in distress.  Last diarrhea was  almost 7 hours ago.  Abdomen appears benign on my exam.  Hospital Course:  Principal Problem:   SIRS (systemic inflammatory response syndrome) ([facility]) Active Problems:   Chronic diastolic CHF (congestive heart failure) ([facility])   Nausea vomiting and diarrhea   Lower urinary tract infectious disease   Asthma  Sepsis due to urinary tract infection (present on admission) in an Immunosupressed patient.   -She presented with n/v/diarrhea, she presented with sinus tachycardia her heart rate 111, tachypnea respiration rate ranged from 27-31, leukocytosis WBC 22.7 , lactic acidosis lactic acid 2.18 , -C. difficile negative , blood culture no growth , urine culture with multiple bacteria non-predominant -treat improved with hydration and IV Rocephin , nausea vomiting diarrhea has resolved , sepsis etiology resolved ,  -She is discharged on Keflex to finish total 5 days antibiotic treatment .   Chronic Diastolic heart failure.  -She presented with dehydration and received hydration .  Home meds Lasix held during hospitalization , resumed at discharge  no signs of exacerbation, euvolemic at discharge  S/P renal transplant (h/o polycystic kidney)/CKDIII.  -continue Immunosuppressive therapy including prednisone, mycophenolate, tacrolimus. -cr at baseline, continue follow up with nephrology Dr Deterding  Asthma. No clinical signs of exacerbation. On dulera.     Depression. stable on home meds fluoxetine.   Procedures:  none  Consultations:  none  Discharge Exam: BP 105/60 (BP Location: Right Arm)   Pulse 62   Temp 97.6 F (36.4 C) (Oral)   Resp 16   Ht 5\' 1"  (1.549 m)   Wt 88.1  kg   SpO2 96%   BMI 36.70 kg/m   General: NAD Cardiovascular: RRR Respiratory: CTABL  Discharge Instructions You were cared for by a hospitalist during your hospital stay. If you have any questions about your discharge medications or the care you received while you were in the hospital after you are  discharged, you can call the unit and asked to speak with the hospitalist on call if the hospitalist that took care of you is not available. Once you are discharged, your primary care physician will handle any further medical issues. Please note that NO REFILLS for any discharge medications will be authorized once you are discharged, as it is imperative that you return to your primary care physician (or establish a relationship with a primary care physician if you do not have one) for your aftercare needs so that they can reassess your need for medications and monitor your lab values.  Discharge Instructions    Diet general   Complete by:  As directed    Increase activity slowly   Complete by:  As directed      Allergies as of 10/05/2018      Reactions   Infed [iron Dextran] Other (See Comments)   Chest tightness   Pentamidine Itching, Shortness Of Breath, Swelling   Erythromycin [erythromycin] Other (See Comments)   Mouth Ulcers   Oxycodone Nausea Only, Nausea And Vomiting   Erythromycin Rash   Causes breakout in mouth   Ultram [tramadol Hcl] Anxiety      Medication List    STOP taking these medications   Azelastine-Fluticasone 137-50 MCG/ACT Susp     TAKE these medications   acetaminophen 500 MG tablet Commonly known as:  TYLENOL Take 1,000 mg by mouth every 6 (six) hours as needed for moderate pain.   albuterol 108 (90 Base) MCG/ACT inhaler Commonly known as:  PROVENTIL HFA;VENTOLIN HFA Inhale 2 puffs into the lungs every 4 (four) hours as needed for wheezing or shortness of breath.   alendronate 70 MG tablet Commonly known as:  FOSAMAX Take 70 mg by mouth every Friday.   allopurinol 300 MG tablet Commonly known as:  ZYLOPRIM Take 300 mg by mouth daily.   aspirin EC 81 MG tablet Take 81 mg by mouth daily.   brimonidine 0.1 % Soln Commonly known as:  ALPHAGAN P Place 1 drop 2 (two) times daily into the left eye.   budesonide-formoterol 160-4.5 MCG/ACT  inhaler Commonly known as:  SYMBICORT Inhale 2 puffs 2 (two) times daily as needed into the lungs (SOB).   cephALEXin 500 MG capsule Commonly known as:  KEFLEX Take 1 capsule (500 mg total) by mouth every 12 (twelve) hours for 3 days.   cholecalciferol 25 MCG (1000 UT) tablet Commonly known as:  VITAMIN D3 Take 1,000 Units by mouth daily.   cycloSPORINE 0.05 % ophthalmic emulsion Commonly known as:  RESTASIS Place 1 drop into both eyes 2 (two) times daily.   FLUoxetine 40 MG capsule Commonly known as:  PROZAC Take 40 mg by mouth daily.   furosemide 40 MG tablet Commonly known as:  LASIX Take 1 tablet by mouth daily.   magnesium oxide 400 MG tablet Commonly known as:  MAG-OX Take 400 mg by mouth 2 (two) times daily.   mycophenolate 180 MG EC tablet Commonly known as:  MYFORTIC Take 180 mg by mouth 2 (two) times daily.   pantoprazole 40 MG tablet Commonly known as:  PROTONIX Take 40 mg by mouth daily.   prednisoLONE  acetate 1 % ophthalmic suspension Commonly known as:  PRED FORTE Place 1 drop See admin instructions into both eyes. 1 drop left eye four times daily. 1 drop right eye once daily.   predniSONE 5 MG tablet Commonly known as:  DELTASONE Take 7.5 mg by mouth daily with breakfast. What changed:  Another medication with the same name was removed. Continue taking this medication, and follow the directions you see here.   tacrolimus 0.5 MG capsule Commonly known as:  PROGRAF Take 0.5 mg by mouth every evening.   tacrolimus 1 MG capsule Commonly known as:  PROGRAF Take 1 mg by mouth every morning.   TRAVATAN Z 0.004 % Soln ophthalmic solution Generic drug:  Travoprost (BAK Free) Place 1 drop into both eyes at bedtime.   vitamin B-12 100 MCG tablet Commonly known as:  CYANOCOBALAMIN Take 100 mcg by mouth daily.      Allergies  Allergen Reactions  . Infed [Iron Dextran] Other (See Comments)    Chest tightness  . Pentamidine Itching, Shortness Of  Breath and Swelling  . Erythromycin [Erythromycin] Other (See Comments)    Mouth Ulcers  . Oxycodone Nausea Only and Nausea And Vomiting  . Erythromycin Rash    Causes breakout in mouth  . Ultram [Tramadol Hcl] Anxiety   Follow-up Information    [name], MD Follow up.   Specialty:  Family Medicine Why:  hospital discharge follow up, repeat cbc/bmp at follow up Contact information: [place] [address] [phone]        Mauricia Area, MD Follow up.   Specialty:  Nephrology Contact information: [place] [address] [phone]            The results of significant diagnostics from this hospitalization (including imaging, microbiology, ancillary and laboratory) are listed below for reference.    Significant Diagnostic Studies: Dg Chest 2 View  Result Date: 10/04/2018 CLINICAL DATA:  Increase shortness of breath over baseline. History of asthma. EXAM: CHEST - 2 VIEW COMPARISON:  Portable chest x-ray of October 02, 2018 and August 30, 2018 FINDINGS: The lungs are reasonably well inflated. The interstitial markings are coarse bilaterally and slightly more conspicuous overall today. There are linear increased densities at both lung bases which are also more conspicuous. There are no alveolar infiltrates. There is no pleural effusion. The heart is normal in size. The pulmonary vascularity is not engorged. IMPRESSION: Chronic bronchitic-reactive airway changes. I cannot exclude superimposed acute bronchitis. There is subsegmental atelectasis at both lung bases which is slightly more conspicuous today. Electronically Signed   By: David  Martinique M.D.   On: 10/04/2018 14:57   Nm Pulmonary Perf And Vent  Result Date: 10/04/2018 CLINICAL DATA:  Shortness of breath with chest pain EXAM: NUCLEAR MEDICINE VENTILATION - PERFUSION LUNG SCAN VIEWS: Anterior, posterior, left lateral, right lateral, RPO, LPO, RAO, LAO-ventilation and perfusion  RADIOPHARMACEUTICALS:  31.7 mCi of Tc-36m DTPA aerosol inhalation and 4.4 mCi Tc24m MAA IV COMPARISON:  Chest radiograph October 04, 2018 FINDINGS: Ventilation: Radiotracer uptake is homogeneous and symmetric bilaterally. No ventilation defects evident. Perfusion: Radiotracer uptake is homogeneous and symmetric bilaterally. No perfusion defects evident. IMPRESSION: No ventilation or perfusion defects evident. Very low probability of pulmonary embolus. Electronically Signed   By: Lowella Grip III M.D.   On: 10/04/2018 16:31   Dg Chest Port 1 View  Result Date: 10/02/2018 CLINICAL DATA:  Shortness of breath, fever for 2 days. EXAM: PORTABLE CHEST 1 VIEW COMPARISON:  Chest  x-rays dated 08/30/2018 and 07/15/2016. FINDINGS: Study is hypoinspiratory with crowding of the perihilar and bibasilar bronchovascular markings. Given the low lung volumes, lungs appear clear. No evidence of pneumonia. No pleural effusion or pneumothorax seen. Heart size and mediastinal contours are stable. IMPRESSION: Low lung volumes. No active disease. No evidence of pneumonia or pulmonary edema. Electronically Signed   By: Franki Cabot M.D.   On: 10/02/2018 20:00   Vas [place] Lower Extremity Venous (dvt)  Result Date: 10/03/2018  Lower Venous Study Indications: D dimer.  Performing Technologist: Landry Mellow RDMS, RVT  Examination Guidelines: A complete evaluation includes B-mode imaging, spectral Doppler, color Doppler, and power Doppler as needed of all accessible portions of each vessel. Bilateral testing is considered an integral part of a complete examination. Limited examinations for reoccurring indications may be performed as noted.  Right Venous Findings: +---------+---------------+---------+-----------+----------+-------+          CompressibilityPhasicitySpontaneityPropertiesSummary +---------+---------------+---------+-----------+----------+-------+ CFV      Full           Yes      Yes                           +---------+---------------+---------+-----------+----------+-------+ SFJ      Full                                                 +---------+---------------+---------+-----------+----------+-------+ FV Prox  Full                                                 +---------+---------------+---------+-----------+----------+-------+ FV Mid   Full                                                 +---------+---------------+---------+-----------+----------+-------+ FV DistalFull                                                 +---------+---------------+---------+-----------+----------+-------+ PFV      Full                                                 +---------+---------------+---------+-----------+----------+-------+ POP      Full           Yes      Yes                          +---------+---------------+---------+-----------+----------+-------+ PTV      Full                                                 +---------+---------------+---------+-----------+----------+-------+ PERO     Full                                                 +---------+---------------+---------+-----------+----------+-------+  Left Venous Findings: +---------+---------------+---------+-----------+----------+-------+          CompressibilityPhasicitySpontaneityPropertiesSummary +---------+---------------+---------+-----------+----------+-------+ CFV      Full           Yes      Yes                          +---------+---------------+---------+-----------+----------+-------+ SFJ      Full                                                 +---------+---------------+---------+-----------+----------+-------+ FV Prox  Full                                                 +---------+---------------+---------+-----------+----------+-------+ FV Mid   Full                                                  +---------+---------------+---------+-----------+----------+-------+ FV DistalFull                                                 +---------+---------------+---------+-----------+----------+-------+ PFV      Full                                                 +---------+---------------+---------+-----------+----------+-------+ POP      Full           Yes      Yes                          +---------+---------------+---------+-----------+----------+-------+ PTV      Full                                                 +---------+---------------+---------+-----------+----------+-------+ PERO     Full                                                 +---------+---------------+---------+-----------+----------+-------+    Summary: Right: There is no evidence of deep vein thrombosis in the lower extremity. No cystic structure found in the popliteal fossa. Left: There is no evidence of deep vein thrombosis in the lower extremity. No cystic structure found in the popliteal fossa.  *See table(s) above for measurements and observations. Electronically signed by Ruta Hinds MD on 10/03/2018 at 4:30:27 PM.    Final     Microbiology: Recent Results (from the past 240 hour(s))  Blood Culture (routine x 2)     Status: None (Preliminary result)   Collection Time: 10/02/18  7:29 PM  Result Value Ref Range Status   Specimen Description BLOOD RIGHT HAND  Final   Special Requests   Final    Immunocompromised Blood Culture adequate volume BOTTLES DRAWN AEROBIC AND ANAEROBIC Performed at [facility], [address], [place], [place] [address]    Culture   Final    NO GROWTH 3 DAYS Performed at [facility], [place] [address] [place], [place] [address]    Report Status PENDING  Incomplete  Urine culture     Status: None   Collection Time: 10/02/18  7:47 PM  Result Value Ref Range Status   Specimen Description   Final    URINE, CLEAN CATCH Performed at [facility], [address] [place], [place] [address]    Special Requests   Final    Immunocompromised Performed at [facility], [address] [place], [place] [address]    Culture   Final    Multiple bacterial morphotypes present, none predominant. Suggest appropriate recollection if clinically indicated.   Report Status 10/04/2018 FINAL  Final  Blood Culture (routine x 2)     Status: None (Preliminary result)   Collection Time: 10/02/18  8:15 PM  Result Value Ref Range Status   Specimen Description   Final    BLOOD RIGHT FOREARM Performed at [facility], [address] [place], [place] [address]    Special Requests   Final    BOTTLES DRAWN AEROBIC AND ANAEROBIC Blood Culture adequate volume Performed at [facility], [address] [place], [place] [address]    Culture   Final    NO GROWTH 3 DAYS Performed at [facility], [place] [address] [place], [place] [address]    Report Status PENDING  Incomplete  C difficile quick scan w PCR reflex     Status: None   Collection Time: 10/03/18 11:54 AM  Result Value Ref Range Status   C Diff antigen NEGATIVE NEGATIVE Final   C Diff toxin NEGATIVE NEGATIVE Final   C Diff interpretation No C. difficile detected.  Final    Comment: Performed at [name], [place] [address] [place], [place] [address]     Labs: Basic Metabolic Panel: Recent Labs  Lab 10/02/18 2021 10/03/18 0117 10/03/18 0404 10/04/18 0523 10/05/18 0449  NA 134*  --  137 138 138  K 4.3  --  5.3* 4.3 4.2  CL 100  --  107 109 108  CO2 21*  --  22 22 21*  GLUCOSE 110*  --  138* 127* 131*  BUN 26*  --  26* 36* 43*  CREATININE 1.09*  --  1.14* 1.16* 1.17*  CALCIUM 9.2  --  8.5* 8.7* 8.8*  MG  --  2.1  --   --   --    Liver Function Tests: Recent Labs  Lab 10/02/18 2021 10/03/18 0404 10/04/18 0523  AST 24 19 16   ALT 16 15 14   ALKPHOS 88 73 60  BILITOT 4.5* 3.1* 0.9  PROT 7.6 6.9 6.3*    ALBUMIN 3.3* 2.8* 2.5*   Recent Labs  Lab 10/02/18 2021  LIPASE 28   No results for input(s): AMMONIA in the last 168 hours. CBC: Recent Labs  Lab 10/02/18 1929 10/03/18 0404 10/04/18 0523 10/05/18 0449  WBC 22.7* 14.8* 9.9 7.2  NEUTROABS  --  13.8* 8.8* 5.9  HGB 13.2 11.4* 10.5* 9.9*  HCT 45.1  39.5 37.0 35.4*  MCV 95.8 98.5 100.8* 102.3*  PLT 198 153 141* 128*   Cardiac Enzymes: Recent Labs  Lab 10/02/18 1929 10/03/18 0117 10/03/18 0643 10/03/18 1233  TROPONINI <0.03 <0.03 <0.03 <0.03   BNP: BNP (last 3 results) Recent Labs    10/02/18 1930  BNP 309.1*    ProBNP (last 3 results) No results for input(s): PROBNP in the last 8760 hours.  CBG: No results for input(s): GLUCAP in the last 168 hours.     Signed:  Florencia Reasons MD, PhD  [facility] 10/05/2018, 2:45 PM

## 2018-10-07 LAB — CULTURE, BLOOD (ROUTINE X 2)
CULTURE: NO GROWTH
Culture: NO GROWTH
SPECIAL REQUESTS: ADEQUATE

## 2018-12-21 ENCOUNTER — Encounter (HOSPITAL_COMMUNITY): Payer: Self-pay | Admitting: *Deleted

## 2018-12-21 ENCOUNTER — Inpatient Hospital Stay (HOSPITAL_COMMUNITY): Payer: Medicare Other

## 2018-12-21 ENCOUNTER — Emergency Department (HOSPITAL_COMMUNITY): Payer: Medicare Other

## 2018-12-21 ENCOUNTER — Inpatient Hospital Stay (HOSPITAL_COMMUNITY)
Admission: EM | Admit: 2018-12-21 | Discharge: 2018-12-29 | DRG: 492 | Disposition: A | Discharge status: Skilled Nursing Facility | Payer: Medicare Other | Attending: Internal Medicine | Admitting: Internal Medicine

## 2018-12-21 DIAGNOSIS — Z8271 Family history of polycystic kidney: Secondary | ICD-10-CM

## 2018-12-21 DIAGNOSIS — S82102A Unspecified fracture of upper end of left tibia, initial encounter for closed fracture: Secondary | ICD-10-CM | POA: Diagnosis present

## 2018-12-21 DIAGNOSIS — T07XXXA Unspecified multiple injuries, initial encounter: Secondary | ICD-10-CM | POA: Diagnosis not present

## 2018-12-21 DIAGNOSIS — N179 Acute kidney failure, unspecified: Secondary | ICD-10-CM

## 2018-12-21 DIAGNOSIS — Z419 Encounter for procedure for purposes other than remedying health state, unspecified: Secondary | ICD-10-CM

## 2018-12-21 DIAGNOSIS — W010XXA Fall on same level from slipping, tripping and stumbling without subsequent striking against object, initial encounter: Secondary | ICD-10-CM | POA: Diagnosis not present

## 2018-12-21 DIAGNOSIS — Z951 Presence of aortocoronary bypass graft: Secondary | ICD-10-CM | POA: Diagnosis not present

## 2018-12-21 DIAGNOSIS — Y92009 Unspecified place in unspecified non-institutional (private) residence as the place of occurrence of the external cause: Secondary | ICD-10-CM

## 2018-12-21 DIAGNOSIS — Q613 Polycystic kidney, unspecified: Secondary | ICD-10-CM | POA: Diagnosis not present

## 2018-12-21 DIAGNOSIS — Z8249 Family history of ischemic heart disease and other diseases of the circulatory system: Secondary | ICD-10-CM

## 2018-12-21 DIAGNOSIS — I5032 Chronic diastolic (congestive) heart failure: Secondary | ICD-10-CM

## 2018-12-21 DIAGNOSIS — D72829 Elevated white blood cell count, unspecified: Secondary | ICD-10-CM | POA: Diagnosis present

## 2018-12-21 DIAGNOSIS — K219 Gastro-esophageal reflux disease without esophagitis: Secondary | ICD-10-CM | POA: Diagnosis present

## 2018-12-21 DIAGNOSIS — F329 Major depressive disorder, single episode, unspecified: Secondary | ICD-10-CM | POA: Diagnosis present

## 2018-12-21 DIAGNOSIS — W19XXXA Unspecified fall, initial encounter: Secondary | ICD-10-CM

## 2018-12-21 DIAGNOSIS — S82201A Unspecified fracture of shaft of right tibia, initial encounter for closed fracture: Secondary | ICD-10-CM

## 2018-12-21 DIAGNOSIS — S82141A Displaced bicondylar fracture of right tibia, initial encounter for closed fracture: Principal | ICD-10-CM | POA: Diagnosis present

## 2018-12-21 DIAGNOSIS — M810 Age-related osteoporosis without current pathological fracture: Secondary | ICD-10-CM | POA: Diagnosis not present

## 2018-12-21 DIAGNOSIS — S82891A Other fracture of right lower leg, initial encounter for closed fracture: Secondary | ICD-10-CM | POA: Diagnosis not present

## 2018-12-21 DIAGNOSIS — J4520 Mild intermittent asthma, uncomplicated: Secondary | ICD-10-CM | POA: Diagnosis present

## 2018-12-21 DIAGNOSIS — Z90721 Acquired absence of ovaries, unilateral: Secondary | ICD-10-CM | POA: Diagnosis not present

## 2018-12-21 DIAGNOSIS — S82202A Unspecified fracture of shaft of left tibia, initial encounter for closed fracture: Secondary | ICD-10-CM

## 2018-12-21 DIAGNOSIS — R0902 Hypoxemia: Secondary | ICD-10-CM

## 2018-12-21 DIAGNOSIS — G9340 Encephalopathy, unspecified: Secondary | ICD-10-CM | POA: Diagnosis not present

## 2018-12-21 DIAGNOSIS — T148XXA Other injury of unspecified body region, initial encounter: Secondary | ICD-10-CM | POA: Diagnosis not present

## 2018-12-21 DIAGNOSIS — Z87891 Personal history of nicotine dependence: Secondary | ICD-10-CM | POA: Diagnosis not present

## 2018-12-21 DIAGNOSIS — S82101A Unspecified fracture of upper end of right tibia, initial encounter for closed fracture: Secondary | ICD-10-CM

## 2018-12-21 DIAGNOSIS — N170 Acute kidney failure with tubular necrosis: Secondary | ICD-10-CM | POA: Diagnosis present

## 2018-12-21 DIAGNOSIS — R110 Nausea: Secondary | ICD-10-CM | POA: Diagnosis present

## 2018-12-21 DIAGNOSIS — M25531 Pain in right wrist: Secondary | ICD-10-CM | POA: Diagnosis present

## 2018-12-21 DIAGNOSIS — E875 Hyperkalemia: Secondary | ICD-10-CM | POA: Diagnosis present

## 2018-12-21 DIAGNOSIS — Z940 Kidney transplant status: Secondary | ICD-10-CM | POA: Diagnosis not present

## 2018-12-21 DIAGNOSIS — E213 Hyperparathyroidism, unspecified: Secondary | ICD-10-CM | POA: Diagnosis present

## 2018-12-21 DIAGNOSIS — R739 Hyperglycemia, unspecified: Secondary | ICD-10-CM | POA: Diagnosis not present

## 2018-12-21 DIAGNOSIS — E785 Hyperlipidemia, unspecified: Secondary | ICD-10-CM | POA: Diagnosis not present

## 2018-12-21 DIAGNOSIS — G9341 Metabolic encephalopathy: Secondary | ICD-10-CM | POA: Diagnosis not present

## 2018-12-21 DIAGNOSIS — E871 Hypo-osmolality and hyponatremia: Secondary | ICD-10-CM | POA: Diagnosis present

## 2018-12-21 LAB — CBC WITH DIFFERENTIAL/PLATELET
Abs Immature Granulocytes: 0.05 10*3/uL (ref 0.00–0.07)
Basophils Absolute: 0 10*3/uL (ref 0.0–0.1)
Basophils Relative: 0 %
EOS ABS: 0.1 10*3/uL (ref 0.0–0.5)
Eosinophils Relative: 1 %
HEMATOCRIT: 40 % (ref 36.0–46.0)
HEMOGLOBIN: 11.5 g/dL — AB (ref 12.0–15.0)
IMMATURE GRANULOCYTES: 1 %
LYMPHS ABS: 0.6 10*3/uL — AB (ref 0.7–4.0)
LYMPHS PCT: 7 %
MCH: 28.8 pg (ref 26.0–34.0)
MCHC: 28.8 g/dL — ABNORMAL LOW (ref 30.0–36.0)
MCV: 100.3 fL — AB (ref 80.0–100.0)
MONOS PCT: 3 %
Monocytes Absolute: 0.3 10*3/uL (ref 0.1–1.0)
NEUTROS PCT: 88 %
Neutro Abs: 8.1 10*3/uL — ABNORMAL HIGH (ref 1.7–7.7)
Platelets: 168 10*3/uL (ref 150–400)
RBC: 3.99 MIL/uL (ref 3.87–5.11)
RDW: 18.5 % — AB (ref 11.5–15.5)
WBC: 9.1 10*3/uL (ref 4.0–10.5)
nRBC: 0 % (ref 0.0–0.2)

## 2018-12-21 LAB — COMPREHENSIVE METABOLIC PANEL
ALBUMIN: 3.4 g/dL — AB (ref 3.5–5.0)
ALK PHOS: 67 U/L (ref 38–126)
ALT: 15 U/L (ref 0–44)
ANION GAP: 8 (ref 5–15)
AST: 22 U/L (ref 15–41)
BILIRUBIN TOTAL: 1.1 mg/dL (ref 0.3–1.2)
BUN: 31 mg/dL — ABNORMAL HIGH (ref 8–23)
CALCIUM: 9.1 mg/dL (ref 8.9–10.3)
CO2: 24 mmol/L (ref 22–32)
CREATININE: 1.18 mg/dL — AB (ref 0.44–1.00)
Chloride: 104 mmol/L (ref 98–111)
GFR, EST AFRICAN AMERICAN: 58 mL/min — AB (ref >60)
GFR, EST NON AFRICAN AMERICAN: 50 mL/min — AB (ref >60)
Glucose, Bld: 166 mg/dL — ABNORMAL HIGH (ref 70–99)
Potassium: 4 mmol/L (ref 3.5–5.1)
Sodium: 136 mmol/L (ref 135–145)
TOTAL PROTEIN: 7.3 g/dL (ref 6.5–8.1)

## 2018-12-21 MED ORDER — PREDNISONE 5 MG PO TABS
2.5000 mg | ORAL_TABLET | Freq: Every day | ORAL | Status: DC
  Administered 2018-12-22 – 2018-12-28 (×7): 2.5 mg via ORAL
  Filled 2018-12-21 (×7): qty 1

## 2018-12-21 MED ORDER — HYDROMORPHONE HCL 1 MG/ML IJ SOLN
0.5000 mg | Freq: Once | INTRAMUSCULAR | Status: AC
  Administered 2018-12-21: 0.5 mg via INTRAVENOUS
  Filled 2018-12-21: qty 1

## 2018-12-21 MED ORDER — PREDNISONE 5 MG PO TABS
5.0000 mg | ORAL_TABLET | Freq: Every day | ORAL | Status: DC
  Administered 2018-12-22: 5 mg via ORAL
  Filled 2018-12-21 (×2): qty 1

## 2018-12-21 MED ORDER — TACROLIMUS 1 MG PO CAPS
1.0000 mg | ORAL_CAPSULE | Freq: Every day | ORAL | Status: DC
  Administered 2018-12-22 – 2018-12-29 (×7): 1 mg via ORAL
  Filled 2018-12-21 (×8): qty 1

## 2018-12-21 MED ORDER — METHYLPREDNISOLONE SODIUM SUCC 40 MG IJ SOLR
5.0000 mg | Freq: Once | INTRAMUSCULAR | Status: AC
  Administered 2018-12-21: 5.2 mg via INTRAVENOUS
  Filled 2018-12-21: qty 1

## 2018-12-21 MED ORDER — TACROLIMUS 0.5 MG PO CAPS
0.5000 mg | ORAL_CAPSULE | Freq: Every day | ORAL | Status: DC
  Administered 2018-12-21 – 2018-12-28 (×8): 0.5 mg via ORAL
  Filled 2018-12-21 (×9): qty 1

## 2018-12-21 MED ORDER — METHOCARBAMOL 1000 MG/10ML IJ SOLN
500.0000 mg | Freq: Once | INTRAVENOUS | Status: AC
  Administered 2018-12-21: 500 mg via INTRAVENOUS
  Filled 2018-12-21: qty 500

## 2018-12-21 NOTE — ED Provider Notes (Addendum)
[facility] DEPT Provider Note   CSN: [id number] Arrival date & time: 12/21/18  1841     History   Chief Complaint Chief Complaint  Patient presents with  . Knee Injury  . Fall    HPI April [name] is a 63 y.o. female.  63 year old female presents with bilateral knee, right wrist, right ankle pain after a fall today.  States that she tripped and fell forward.  Did not strike her head or lose consciousness.  Denies any neck back chest or abdominal discomfort.  Denies any hip discomfort.  Notes severe pain characterizes sharp and worse with movement to both knees.  She cannot stand at this time.  EMS called and patient given 100 mcg of fentanyl and transported here.     Past Medical History:  Diagnosis Date  . Arthritis   . Asthma   . Depression   . GERD (gastroesophageal reflux disease)   . Hyperlipidemia   . Hyperparathyroidism   . Polycystic kidney   . PONV (postoperative nausea and vomiting)     Patient Active Problem List   Diagnosis Date Noted  . Lower urinary tract infectious disease 10/03/2018  . Asthma 10/03/2018  . SIRS (systemic inflammatory response syndrome) ([place]) 10/03/2018  . Nausea vomiting and diarrhea 10/02/2018  . Chronic diastolic CHF (congestive heart failure) ([place]) 10/01/2017  . Influenza B 10/01/2017  . Persistent asthma with status asthmaticus   . Pneumonia 07/15/2016  . Asthma 07/15/2016  . GERD (gastroesophageal reflux disease) 07/15/2016  . Renal transplant recipient 07/15/2016  . Hyperlipidemia 07/15/2016  . Depression 07/15/2016  . Bronchitis, mucopurulent recurrent ([place]) 08/08/2014  . Chronic cough 07/24/2014  . End stage renal disease ([place]) 03/23/2012  . 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 BILATERAL OOPHORECTOMY    . CARPAL TUNNEL  RELEASE  2000  . CESAREAN SECTION    . INSERTION OF DIALYSIS CATHETER  03/31/2012   Procedure: INSERTION OF DIALYSIS CATHETER;  Surgeon: Angelia Mould, MD;  Location: [facility];  Service: Vascular;  Laterality: N/A;  insertion of dialysis catheter right internal jugular vein  . 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 TONSILLECTOMY  1967  . TUBAL LIGATION  2010     OB History   No obstetric history on file.      Home Medications    Prior to Admission medications   Medication Sig Start Date End Date Taking? Authorizing Provider  acetaminophen (TYLENOL) 500 MG tablet Take 1,000 mg by mouth every 6 (six) hours as needed for moderate pain.    [provider]  albuterol (PROVENTIL HFA;VENTOLIN HFA) 108 (90 Base) MCG/ACT inhaler Inhale 2 puffs into the lungs every 4 (four) hours as needed for wheezing or shortness of breath. 07/16/16   Ghimire, Henreitta Leber, MD  alendronate (FOSAMAX) 70 MG tablet Take 70 mg by mouth every Friday.  05/26/18   [provider]  allopurinol (ZYLOPRIM) 300 MG tablet Take 300 mg by mouth daily.    [provider]  aspirin EC 81 MG tablet Take 81 mg by mouth daily.    [provider]  brimonidine (ALPHAGAN P) 0.1 % SOLN Place 1 drop 2 (two) times daily into the left eye.    [provider]  budesonide-formoterol (SYMBICORT) 160-4.5 MCG/ACT inhaler Inhale 2 puffs 2 (two) times  daily as needed into the lungs (SOB).     [provider]  cholecalciferol (VITAMIN D3) 25 MCG (1000 UT) tablet Take 1,000 Units by mouth daily.    [provider]  cycloSPORINE (RESTASIS) 0.05 % ophthalmic emulsion Place 1 drop into both eyes 2 (two) times daily.    [provider]  FLUoxetine (PROZAC) 40 MG capsule Take 40 mg by mouth daily.    [provider]  furosemide (LASIX) 40 MG tablet Take 1 tablet by mouth daily. 04/20/14   [provider]  magnesium oxide (MAG-OX) 400 MG tablet Take 400 mg by mouth 2  (two) times daily.    [provider]  mycophenolate (MYFORTIC) 180 MG EC tablet Take 180 mg by mouth 2 (two) times daily.    [provider]  pantoprazole (PROTONIX) 40 MG tablet Take 40 mg by mouth daily.     [provider]  prednisoLONE acetate (PRED FORTE) 1 % ophthalmic suspension Place 1 drop See admin instructions into both eyes. 1 drop left eye four times daily. 1 drop right eye once daily.    [provider]  predniSONE (DELTASONE) 5 MG tablet Take 7.5 mg by mouth daily with breakfast.    [provider]  tacrolimus (PROGRAF) 0.5 MG capsule Take 0.5 mg by mouth every evening.    [provider]  tacrolimus (PROGRAF) 1 MG capsule Take 1 mg by mouth every morning.     [provider]  TRAVATAN Z 0.004 % SOLN ophthalmic solution Place 1 drop into both eyes at bedtime. 07/03/16   [provider]  vitamin B-12 (CYANOCOBALAMIN) 100 MCG tablet Take 100 mcg by mouth daily.    [provider]    Family History Family History  Problem Relation Age of Onset  . Hypertension Mother   . Heart disease Father        CABG history  . Polycystic kidney disease Father   . Polycystic kidney disease Brother     Social History Social History   Tobacco Use  . Smoking status: Former Smoker    Packs/day: 0.25    Years: 15.00    Pack years: 3.75    Types: Cigarettes    Last attempt to quit: 03/22/1998    Years since quitting: 20.7  . Smokeless tobacco: Never Used  Substance Use Topics  . Alcohol use: No    Comment: social  . Drug use: No     Allergies   Infed [iron dextran]; Pentamidine; Erythromycin [erythromycin]; Oxycodone; Erythromycin; and Ultram [tramadol hcl]   Review of Systems Review of Systems  All other systems reviewed and are negative.    Physical Exam Updated Vital Signs BP 136/73 (BP Location: Right Arm)   Pulse 83   Temp 97.8 F (36.6 C) (Oral)   Resp 18   SpO2 92%   Physical  Exam Vitals signs and nursing note reviewed.  Constitutional:      General: She is not in acute distress.    Appearance: Normal appearance. She is well-developed. She is not toxic-appearing.  HENT:     Head: Normocephalic and atraumatic.  Eyes:     General: Lids are normal.     Conjunctiva/sclera: Conjunctivae normal.     Pupils: Pupils are equal, round, and reactive to light.  Neck:     Musculoskeletal: Normal range of motion and neck supple.     Thyroid: No thyroid mass.     Trachea: No tracheal deviation.  Cardiovascular:  Rate and Rhythm: Normal rate and regular rhythm.     Heart sounds: Normal heart sounds. No murmur. No gallop.   Pulmonary:     Effort: Pulmonary effort is normal. No respiratory distress.     Breath sounds: Normal breath sounds. No stridor. No decreased breath sounds, wheezing, rhonchi or rales.  Abdominal:     General: Bowel sounds are normal. There is no distension.     Palpations: Abdomen is soft.     Tenderness: There is no abdominal tenderness. There is no rebound.  Musculoskeletal:     Right wrist: She exhibits tenderness. She exhibits normal range of motion and no bony tenderness.     Right knee: She exhibits decreased range of motion. She exhibits no effusion and no deformity.     Left knee: She exhibits decreased range of motion, swelling and effusion.       Legs:  Skin:    General: Skin is warm and dry.     Findings: No abrasion or rash.  Neurological:     Mental Status: She is alert and oriented to person, place, and time.     GCS: GCS eye subscore is 4. GCS verbal subscore is 5. GCS motor subscore is 6.     Cranial Nerves: No cranial nerve deficit.     Sensory: No sensory deficit.  Psychiatric:        Speech: Speech normal.        Behavior: Behavior normal.      ED Treatments / Results  Labs (all labs ordered are listed, but only abnormal results are displayed) Labs Reviewed - No data to display  EKG EKG  Interpretation  Date/Time:  Wednesday December 21 2018 19:51:35 EST Ventricular Rate:  88 PR Interval:    QRS Duration: 89 QT Interval:  376 QTC Calculation: 455 R Axis:   80 Text Interpretation:  Sinus rhythm Low voltage with right axis deviation No significant change since last tracing Confirmed by Lacretia Leigh ([id number]) on 12/21/2018 9:03:40 PM   Radiology No results found.  Procedures Procedures (including critical care time)  Medications Ordered in ED Medications  HYDROmorphone (DILAUDID) injection 0.5 mg (has no administration in time range)     Initial Impression / Assessment and Plan / ED Course  I have reviewed the triage vital signs and the nursing notes.  Pertinent labs & imaging results that were available during my care of the patient were reviewed by me and considered in my medical decision making (see chart for details).    Patient medicated for pain here. Patient was stable neurovascular status at both feet.  X-rays noted and discussed with Dr. Lyla Glassing from orthopedics.  Recommends bilateral knee immolbilzers and transferred to [facility] to be seen by trauma orthopedics.  Request hospitalist admission.  Final Clinical Impressions(s) / ED Diagnoses   Final diagnoses:  None    ED Discharge Orders    None       Lacretia Leigh, MD 12/21/18 2059    Lacretia Leigh, MD 12/21/18 2104

## 2018-12-21 NOTE — ED Triage Notes (Signed)
Per EMS, pt tripped and fell onto both knees, states she felt a pop. Pt has deformity to left knee. Pt received 132mcg fentanyl en route. Pt denies head injury, loss of consciousness. Pt denies other injury. Pt is A&Ox4, lives at home with husband.   BP 143/76 HR 90s NSR O2 96% on RA

## 2018-12-21 NOTE — ED Notes (Signed)
EKG given to EDP,Allen,MD., for review. 

## 2018-12-21 NOTE — Progress Notes (Signed)
Orthopedic Tech Progress Note Patient Details:  April [name] [birth year] [id number]   Applied knee immobilizer to right knee was told to leave off left knee.  Patient ID: April [name], female   DOB: May 01, 1956, 63 y.o.   MRN: [id number]   [place] November 12/21/2018, 11:09 PM

## 2018-12-21 NOTE — ED Notes (Signed)
ED TO INPATIENT HANDOFF REPORT  Name/Age/Gender April [name] 63 y.o. female  Code Status Code Status History    Date Active Date Inactive Code Status Order ID Comments User Context   10/03/2018 0047 10/05/2018 1907 Full Code [id number]  Rise Patience, MD Inpatient   10/01/2017 2110 10/02/2017 1942 Full Code [id number]  Vianne Bulls, MD ED   07/15/2016 2352 07/16/2016 2035 Full Code [id number]  Reubin Milan, MD ED      Home/SNF/Other Home  Chief Complaint Fall  Level of Care/Admitting Diagnosis ED Disposition    ED Disposition Condition [place]: [facility] [100100]  Level of Care: Med-Surg [16]  Diagnosis: Multiple fractures [053976]  Admitting Physician: Toy Baker [[id number]]  Attending Physician: Toy Baker [[id number]]  Estimated length of stay: 3 - 4 days  Certification:: [name] will need inpatient services for at least 2 midnights  PT Class (Do Not Modify): Inpatient [101]  PT Acc Code (Do Not Modify): Private [1]       Medical History Past Medical History:  Diagnosis Date  . Arthritis   . Asthma   . Depression   . GERD (gastroesophageal reflux disease)   . Hyperlipidemia   . Hyperparathyroidism   . Polycystic kidney   . PONV (postoperative nausea and vomiting)     Allergies Allergies  Allergen Reactions  . Infed [Iron Dextran] Other (See Comments)    Chest tightness  . Pentamidine Itching, Shortness Of Breath and Swelling  . Erythromycin [Erythromycin] Other (See Comments)    Mouth Ulcers  . Oxycodone Nausea Only and Nausea And Vomiting  . Erythromycin Rash    Causes breakout in mouth  . Ultram [Tramadol Hcl] Anxiety    IV Location/Drains/Wounds Patient Lines/Drains/Airways Status   Active Line/Drains/Airways    Name:   Placement date:   Placement time:   Site:   Days:   Peripheral IV 12/21/18 Right Wrist   12/21/18    -    Wrist   less than 1   Vascular Access  Right Internal jugular   03/31/12    0847    Internal jugular   2456   Vascular Access Left Upper arm Arteriovenous fistula   -    -    Upper arm             Labs/Imaging Results for orders placed or performed during the hospital encounter of 12/21/18 (from the past 48 hour(s))  CBC with Differential/Platelet     Status: Abnormal   Collection Time: 12/21/18  7:59 PM  Result Value Ref Range   WBC 9.1 4.0 - 10.5 K/uL   RBC 3.99 3.87 - 5.11 MIL/uL   Hemoglobin 11.5 (L) 12.0 - 15.0 g/dL   HCT 40.0 36.0 - 46.0 %   MCV 100.3 (H) 80.0 - 100.0 fL   MCH 28.8 26.0 - 34.0 pg   MCHC 28.8 (L) 30.0 - 36.0 g/dL   RDW 18.5 (H) 11.5 - 15.5 %   Platelets 168 150 - 400 K/uL   nRBC 0.0 0.0 - 0.2 %   Neutrophils Relative % 88 %   Neutro Abs 8.1 (H) 1.7 - 7.7 K/uL   Lymphocytes Relative 7 %   Lymphs Abs 0.6 (L) 0.7 - 4.0 K/uL   Monocytes Relative 3 %   Monocytes Absolute 0.3 0.1 - 1.0 K/uL   Eosinophils Relative 1 %   Eosinophils Absolute 0.1 0.0 - 0.5 K/uL   Basophils  Relative 0 %   Basophils Absolute 0.0 0.0 - 0.1 K/uL   Immature Granulocytes 1 %   Abs Immature Granulocytes 0.05 0.00 - 0.07 K/uL    Comment: Performed at [facility], [place] [address], [place], [place] [address]  Comprehensive metabolic panel     Status: Abnormal   Collection Time: 12/21/18  7:59 PM  Result Value Ref Range   Sodium 136 135 - 145 mmol/L   Potassium 4.0 3.5 - 5.1 mmol/L   Chloride 104 98 - 111 mmol/L   CO2 24 22 - 32 mmol/L   Glucose, Bld 166 (H) 70 - 99 mg/dL   BUN 31 (H) 8 - 23 mg/dL   Creatinine, Ser 1.18 (H) 0.44 - 1.00 mg/dL   Calcium 9.1 8.9 - 10.3 mg/dL   Total Protein 7.3 6.5 - 8.1 g/dL   Albumin 3.4 (L) 3.5 - 5.0 g/dL   AST 22 15 - 41 U/L   ALT 15 0 - 44 U/L   Alkaline Phosphatase 67 38 - 126 U/L   Total Bilirubin 1.1 0.3 - 1.2 mg/dL   GFR calc non Af Amer 50 (L) >60 mL/min   GFR calc Af Amer 58 (L) >60 mL/min   Anion gap 8 5 - 15    Comment: Performed at [facility], [place] [address] [place],  [address]   Dg Wrist Complete Right  Result Date: 12/21/2018 CLINICAL DATA:  Trip and fall RIGHT wrist pain. EXAM: RIGHT WRIST - COMPLETE 3+ VIEW COMPARISON:  RIGHT wrist radiograph October 13, 2014. FINDINGS: There is no evidence of fracture or dislocation. There is no evidence of arthropathy or other focal bone abnormality. Osteopenia. Intravenous catheter in place. Soft tissues are unremarkable. IMPRESSION: 1. No acute fracture deformity or dislocation. 2. Osteopenia decreases sensitivity for acute nondisplaced fractures. Electronically Signed   By: Elon Alas M.D.   On: 12/21/2018 19:58   Dg Ankle 2 Views Left  Result Date: 12/21/2018 CLINICAL DATA:  Fall EXAM: LEFT ANKLE - 2 VIEW COMPARISON:  None. FINDINGS: There is no evidence of fracture, dislocation, or joint effusion. There is no evidence of arthropathy or other focal bone abnormality. Soft tissues are unremarkable. IMPRESSION: Negative. Electronically Signed   By: Ulyses Jarred M.D.   On: 12/21/2018 19:56   Dg Ankle 2 Views Right  Result Date: 12/21/2018 CLINICAL DATA:  Fall EXAM: RIGHT ANKLE - 2 VIEW COMPARISON:  None. FINDINGS: Nondisplaced fracture of the distal right fibula with mild soft tissue swelling. Ankle mortise remains approximated. No distal tibia fracture. IMPRESSION: Nondisplaced fracture of the distal right fibula with overlying soft tissue swelling. Electronically Signed   By: Ulyses Jarred M.D.   On: 12/21/2018 19:57   Dg Chest Port 1 View  Result Date: 12/21/2018 CLINICAL DATA:  Stated history of postop. Patient reports preop knee surgery. EXAM: PORTABLE CHEST 1 VIEW COMPARISON:  Radiographs 10/13/2018 FINDINGS: The cardiomediastinal contours are unchanged. Streaky left lung base scarring, unchanged from prior. Pulmonary vasculature is normal. No consolidation, pleural effusion, or pneumothorax. No acute osseous abnormalities are seen. IMPRESSION: Left lung  base scarring.  No acute chest findings. Electronically Signed   By: Keith Rake M.D.   On: 12/21/2018 20:21   Dg Knee Complete 4 Views Left  Result Date: 12/21/2018 CLINICAL DATA:  Fall with knee pain EXAM: LEFT KNEE - COMPLETE 4+ VIEW COMPARISON:  None. FINDINGS: Oblique fracture of the proximal left tibia with mild medial displacement. No intra-articular extension. There is  also a comminuted fracture of the proximal shaft of the left fibula. Minimal displacement. The knee remains approximated. IMPRESSION: Extra-articular fractures of the proximal tibia and fibula, with mild medial displacement of the tibia fracture. Electronically Signed   By: Ulyses Jarred M.D.   On: 12/21/2018 19:54   Dg Knee Complete 4 Views Right  Result Date: 12/21/2018 CLINICAL DATA:  Trip and fall onto knee, deformity. EXAM: RIGHT KNEE - COMPLETE 4+ VIEW COMPARISON:  None. FINDINGS: Acute mildly depressed lateral tibial plateau fracture. Linear lucency through medial tibial plateau. No dislocation. No destructive bony lesions. Osteopenia. Lipohemarthrosis suspected. IMPRESSION: 1. Acute depressed lateral tibial plateau fracture. Potential nondisplaced medial plateau component. No dislocation. 2. Osteopenia decreases sensitivity for acute nondisplaced fractures. Electronically Signed   By: Elon Alas M.D.   On: 12/21/2018 19:57    Pending Labs [employer] (From admission, onward)    Start     Ordered   Signed and Held  CBC  Tomorrow morning,   R     Signed and Held   Signed and Occupational hygienist morning,   R     Signed and Held   Signed and Held  Type and screen  Once,   R     Signed and Held   Signed and Held  VITAMIN D 25 Hydroxy (Vit-D Deficiency, Fractures)  Add-on,   R     Signed and Held          Vitals/Pain Today's Vitals   12/21/18 2100 12/21/18 2100 12/21/18 2103 12/21/18 2225  BP: 119/66     Pulse: 91     Resp: 17     Temp:      TempSrc:      SpO2: 97%      PainSc:  7  7  7      Isolation Precautions No active isolations  Medications Medications  methocarbamol (ROBAXIN) 500 mg in dextrose 5 % 50 mL IVPB ( Intravenous Rate/Dose Verify 12/21/18 2235)  predniSONE (DELTASONE) tablet 5 mg (has no administration in time range)  tacrolimus (PROGRAF) capsule 0.5 mg (has no administration in time range)  tacrolimus (PROGRAF) capsule 1 mg (has no administration in time range)  methylPREDNISolone sodium succinate (SOLU-MEDROL) 40 mg/mL injection 5.2 mg (has no administration in time range)  predniSONE (DELTASONE) tablet 2.5 mg (has no administration in time range)  HYDROmorphone (DILAUDID) injection 0.5 mg (0.5 mg Intravenous Given 12/21/18 2000)  HYDROmorphone (DILAUDID) injection 0.5 mg (0.5 mg Intravenous Given 12/21/18 2146)    Mobility non-ambulatory

## 2018-12-21 NOTE — H&P (Signed)
JERNEE MURTAUGH TDV:[id number] DOB: 28-Oct-1956 DOA: 12/21/2018     PCP: [name], MD   Outpatient Specialists:     NEphrology:  Dr Deterding  orthopedics Dr.  Boone Master  Patient arrived to ER on 12/21/18 at [place]  Patient coming from: home Lives  With family    Chief Complaint:  Chief Complaint  Patient presents with  . Knee Injury  . Fall    HPI: ASENATH BALASH is a 63 y.o. female with medical history significant of this post renal transplant  (h/o polycystic kidney)/CKDIII , asthma, chronic diastolic CHF    Presented with mechanical fall fell forward with upper body going one direction and lower body another.  in both knees patient felt a pop and noted to have deformity of the left knee EMS was called received 100 mg of fentanyl in route did not hit her head no LOC  She has Asthma she lately have had some exacerbations  Reports occasional chest pain that has been relieved with asthma medications.  At baseline unable to walk a block due to joint pain. She has hard time walking up the stairs due to joint pain.    Regarding pertinent Chronic problems:  Status post renal transplant at [facility] in 2013 secondary to polycystic kidney disease on prednisone mycophenolate and tacrolimus followed by nephrology Was in November in the setting of sepsis  Diastolic CHF last echo prior to transplant in 2013 showed grade 1 diastolic CHF no echo since non symptomatic  While in ER:  The following Work up has been ordered so far:  Orders Placed This Encounter  Procedures  . DG Knee Complete 4 Views Left  . DG Wrist Complete Right  . DG Ankle 2 Views Right  . DG Knee Complete 4 Views Right  . DG Ankle 2 Views Left  . DG Chest Port 1 View  . CBC with Differential/Platelet  . Comprehensive metabolic panel  . Diet NPO time specified  . Apply ice to affected area (if injury is <48 hours old)  . Remove jewelry  . Consult to orthopedic surgery  . Consult to hospitalist    . EKG 12-Lead  . EKG 12-Lead     Following Medications were ordered in ER: Medications  HYDROmorphone (DILAUDID) injection 0.5 mg (0.5 mg Intravenous Given 12/21/18 2000)    Significant initial  Findings: Abnormal Labs Reviewed  CBC WITH DIFFERENTIAL/PLATELET - Abnormal; Notable for the following components:      Result Value   Hemoglobin 11.5 (*)    MCV 100.3 (*)    MCHC 28.8 (*)    RDW 18.5 (*)    Neutro Abs 8.1 (*)    Lymphs Abs 0.6 (*)    All other components within normal limits  COMPREHENSIVE METABOLIC PANEL - Abnormal; Notable for the following components:   Glucose, Bld 166 (*)    BUN 31 (*)    Creatinine, Ser 1.18 (*)    Albumin 3.4 (*)    GFR calc non Af Amer 50 (*)    GFR calc Af Amer 58 (*)    All other components within normal limits    Lactic Acid, Venous    Component Value Date/Time   LATICACIDVEN 1.3 10/03/2018 0404    Na 136 K 4.0  Cr   Stable,  Lab Results  Component Value Date   CREATININE 1.18 (H) 12/21/2018   CREATININE 1.17 (H) 10/05/2018   CREATININE 1.16 (H) 10/04/2018    WBC  9.1  HG/HCT   stable,      Component Value Date/Time   HGB 11.5 (L) 12/21/2018 1959   HGB 10.6 (L) 12/29/2010 1522   HCT 40.0 12/21/2018 1959   HCT 31.2 (L) 12/29/2010 1522    Troponin (Point of Care Test) No results for input(s): TROPIPOC in the last 72 hours.   BNP (last 3 results) Recent Labs    10/02/18 1930  BNP 309.1*    ProBNP (last 3 results) No results for input(s): PROBNP in the last 8760 hours.    UA not ordered  Left knee - Extra-articular fractures of the proximal tibia and fibula, with mild medial displacement of the tibia fracture.   R Wrist neg R ankle Nondisplaced fracture of the distal right fibula with overlying soft tissue swelling. Left ankle - neg R. Knee -  lateral tibial plateau fracture  CXR -  NON acute   ECG:  Personally reviewed by me showing: HR : 88 Rhythm:  NSR,   Paced  no evidence of ischemic changes QTC  455     ED Triage Vitals  Enc Vitals Group     BP 12/21/18 1900 136/73     Pulse Rate 12/21/18 1900 83     Resp 12/21/18 1900 18     Temp 12/21/18 1900 97.8 F (36.6 C)     Temp Source 12/21/18 1900 Oral     SpO2 12/21/18 1900 92 %     Weight --      Height --      Head Circumference --      Peak Flow --      Pain Score 12/21/18 1903 7     Pain Loc --      Pain Edu? --      Excl. in [place]? --   TMAX(24)@       Latest  Blood pressure 136/73, pulse 83, temperature 97.8 F (36.6 C), temperature source Oral, resp. rate 18, SpO2 92 %.    ER Provider Called:     Dr.Swintek  They Recommend admit to medicine and transferred to [facility] Will see in AM   Hospitalist was called for admission for bilateral knee fractures as well as right ankle fracture   Review of Systems:    Pertinent positives include: knee  Constitutional:  No weight loss, night sweats, Fevers, chills, fatigue, weight loss  HEENT:  No headaches, Difficulty swallowing,Tooth/dental problems,Sore throat,  No sneezing, itching, ear ache, nasal congestion, post nasal drip,  Cardio-vascular:  No chest pain, Orthopnea, PND, anasarca, dizziness, palpitations.no Bilateral lower extremity swelling  GI:  No heartburn, indigestion, abdominal pain, nausea, vomiting, diarrhea, change in bowel habits, loss of appetite, melena, blood in stool, hematemesis Resp:  no shortness of breath at rest. No dyspnea on exertion, No excess mucus, no productive cough, No non-productive cough, No coughing up of blood.No change in color of mucus.No wheezing. Skin:  no rash or lesions. No jaundice GU:  no dysuria, change in color of urine, no urgency or frequency. No straining to urinate.  No flank pain.  Musculoskeletal:  No joint pain or no joint swelling. No decreased range of motion. No back pain.  Psych:  No change in mood or affect. No depression or anxiety. No memory loss.  Neuro: no localizing neurological complaints, no  tingling, no weakness, no double vision, no gait abnormality, no slurred speech, no confusion  All systems reviewed and apart from [place] all are negative  Past Medical History:   Past Medical  History:  Diagnosis Date  . Arthritis   . Asthma   . Depression   . GERD (gastroesophageal reflux disease)   . Hyperlipidemia   . Hyperparathyroidism   . Polycystic kidney   . PONV (postoperative nausea and vomiting)       Past Surgical History:  Procedure Laterality Date  . 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 BILATERAL OOPHORECTOMY    . CARPAL TUNNEL RELEASE  2000  . CESAREAN SECTION    . INSERTION OF DIALYSIS CATHETER  03/31/2012   Procedure: INSERTION OF DIALYSIS CATHETER;  Surgeon: Angelia Mould, MD;  Location: [place];  Service: Vascular;  Laterality: N/A;  insertion of dialysis catheter right internal jugular vein  . 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 TONSILLECTOMY  1967  . TUBAL LIGATION  2010    Social History:  Ambulatory   Independently     reports that she quit smoking about 20 years ago. Her smoking use included cigarettes. She has a 3.75 pack-year smoking history. She has never used smokeless tobacco. She reports that she does not drink alcohol or use drugs.     Family History:   Family History  Problem Relation Age of Onset  . Hypertension Mother   . Heart disease Father        CABG history  . Polycystic kidney disease Father   . Polycystic kidney disease Brother     Allergies: Allergies  Allergen Reactions  . Infed [Iron Dextran] Other (See Comments)    Chest tightness  . Pentamidine Itching, Shortness Of Breath and Swelling  . Erythromycin [Erythromycin] Other (See Comments)    Mouth Ulcers  . Oxycodone Nausea Only and Nausea And Vomiting  . Erythromycin Rash    Causes breakout in mouth  . Ultram [Tramadol Hcl] Anxiety     Prior to Admission medications   Medication Sig Start Date End Date Taking? Authorizing  Provider  acetaminophen (TYLENOL) 500 MG tablet Take 1,000 mg by mouth every 6 (six) hours as needed for moderate pain.   Yes [provider]  acyclovir (ZOVIRAX) 400 MG tablet Take 400 mg by mouth 2 (two) times daily as needed (outbreaks).    Yes [provider]  albuterol (PROVENTIL HFA;VENTOLIN HFA) 108 (90 Base) MCG/ACT inhaler Inhale 2 puffs into the lungs every 4 (four) hours as needed for wheezing or shortness of breath. 07/16/16  Yes Ghimire, Henreitta Leber, MD  alendronate (FOSAMAX) 70 MG tablet Take 70 mg by mouth every Friday.  05/26/18  Yes [provider]  allopurinol (ZYLOPRIM) 300 MG tablet Take 300 mg by mouth daily.   Yes [provider]  aspirin EC 81 MG tablet Take 81 mg by mouth daily.   Yes [provider]  brimonidine (ALPHAGAN P) 0.1 % SOLN Place 1 drop 2 (two) times daily into the left eye.   Yes [provider]  budesonide-formoterol (SYMBICORT) 160-4.5 MCG/ACT inhaler Inhale 2 puffs 2 (two) times daily as needed into the lungs (SOB).    Yes [provider]  cholecalciferol (VITAMIN D3) 25 MCG (1000 UT) tablet Take 1,000 Units by mouth every other day.    Yes [provider]  Colchicine 0.6 MG CAPS Take 0.6 mg by mouth daily as needed (gout).    Yes [provider]  cycloSPORINE (RESTASIS) 0.05 % ophthalmic emulsion Place 1 drop into both eyes 2 (two) times daily.   Yes [provider]  FLUoxetine (PROZAC) 40  MG capsule Take 40 mg by mouth daily.   Yes [provider]  furosemide (LASIX) 40 MG tablet Take 1 tablet by mouth daily as needed for edema.  04/20/14  Yes [provider]  latanoprost (XALATAN) 0.005 % ophthalmic solution Place 1 drop into both eyes at bedtime.   Yes [provider]  magnesium oxide (MAG-OX) 400 MG tablet Take 400 mg by mouth 2 (two) times daily.   Yes [provider]  mycophenolate (MYFORTIC) 180 MG EC tablet Take 180 mg by mouth 2  (two) times daily.   Yes [provider]  pantoprazole (PROTONIX) 40 MG tablet Take 40 mg by mouth daily as needed (acid reflux).    Yes [provider]  prednisoLONE acetate (PRED FORTE) 1 % ophthalmic suspension Place 1 drop See admin instructions into both eyes. 1 drop left eye four times daily. 1 drop right eye once daily.   Yes [provider]  predniSONE (DELTASONE) 5 MG tablet Take 2.5-5 mg by mouth See admin instructions. Take 5mg  by mouth in the morning and 2.5mg  in the evening   Yes [provider]  tacrolimus (PROGRAF) 0.5 MG capsule Take 0.5 mg by mouth daily. Take 0.5mg  by mouth daily at 22:00. Pt is taking brand name prograf.   Yes [provider]  tacrolimus (PROGRAF) 1 MG capsule Take 1 mg by mouth daily. Take 1mg  by mouth each morning at 10:00. Pt is taking brand name prograf. 12/17/17 12/29/18 Yes [provider]  TRAVATAN Z 0.004 % SOLN ophthalmic solution Place 1 drop into both eyes at bedtime. 07/03/16  Yes [provider]  vitamin B-12 (CYANOCOBALAMIN) 100 MCG tablet Take 100 mcg by mouth daily.   Yes [provider]   Physical Exam: Blood pressure 136/73, pulse 83, temperature 97.8 F (36.6 C), temperature source Oral, resp. rate 18, SpO2 92 %. 1. General:  in   Acute distress complaining of severe pain    Chronically ill  -appearing 2. Psychological: Alert and   Oriented 3. Head/ENT:   Dry Mucous Membranes                          Head Non traumatic, neck supple                           Poor Dentition 4. SKIN:   decreased Skin turgor,  Skin clean Dry and intact no rash 5. Heart: Regular rate and rhythm no  Murmur, no Rub or gallop 6. Lungs: Clear to auscultation bilaterally, no wheezes or crackles   7. Abdomen: Soft,  non-tender, Non distended  Obese bowel sounds present 8. Lower extremities: no clubbing, cyanosis, no  Edema left knee deformity  9. Neurologically Grossly intact, moving all 4  extremities equally  10. MSK: Normal range of motion limited due to pain    LABS:     Recent Labs  Lab 12/21/18 1959  WBC 9.1  NEUTROABS 8.1*  HGB 11.5*  HCT 40.0  MCV 100.3*  PLT 967   Basic Metabolic Panel: Recent Labs  Lab 12/21/18 1959  NA 136  K 4.0  CL 104  CO2 24  GLUCOSE 166*  BUN 31*  CREATININE 1.18*  CALCIUM 9.1      Recent Labs  Lab 12/21/18 1959  AST 22  ALT 15  ALKPHOS 67  BILITOT 1.1  PROT 7.3  ALBUMIN 3.4*   No results for  input(s): LIPASE, AMYLASE in the last 168 hours. No results for input(s): AMMONIA in the last 168 hours.    HbA1C: No results for input(s): HGBA1C in the last 72 hours. CBG: No results for input(s): GLUCAP in the last 168 hours.    Urine analysis:    Component Value Date/Time   COLORURINE ORANGE (A) 10/02/2018 1929   APPEARANCEUR CLOUDY (A) 10/02/2018 1929   LABSPEC 1.025 10/02/2018 1929   PHURINE 5.5 10/02/2018 1929   GLUCOSEU NEGATIVE 10/02/2018 1929   HGBUR MODERATE (A) 10/02/2018 1929   BILIRUBINUR MODERATE (A) 10/02/2018 1929   KETONESUR 15 (A) 10/02/2018 1929   PROTEINUR 100 (A) 10/02/2018 1929   UROBILINOGEN 0.2 05/15/2013 0733   NITRITE POSITIVE (A) 10/02/2018 1929   LEUKOCYTESUR MODERATE (A) 10/02/2018 1929     Cultures:    Component Value Date/Time   SDES  10/02/2018 2015    BLOOD RIGHT FOREARM Performed at [facility], [address] [place], [place] [address]    [facility]  10/02/2018 2015    BOTTLES DRAWN AEROBIC AND ANAEROBIC Blood Culture adequate volume Performed at [facility], [address], [place], [place] [address]    CULT  10/02/2018 2015    NO GROWTH 5 DAYS Performed at [facility], [place] [address], [place], [place] [address]    REPTSTATUS 10/07/2018 FINAL 10/02/2018 2015     Radiological Exams on Admission: Dg Wrist Complete Right  Result Date: 12/21/2018 CLINICAL DATA:  Trip and fall RIGHT wrist pain. EXAM: RIGHT WRIST - COMPLETE 3+ VIEW  COMPARISON:  RIGHT wrist radiograph October 13, 2014. FINDINGS: There is no evidence of fracture or dislocation. There is no evidence of arthropathy or other focal bone abnormality. Osteopenia. Intravenous catheter in place. Soft tissues are unremarkable. IMPRESSION: 1. No acute fracture deformity or dislocation. 2. Osteopenia decreases sensitivity for acute nondisplaced fractures. Electronically Signed   By: Elon Alas M.D.   On: 12/21/2018 19:58   Dg Ankle 2 Views Left  Result Date: 12/21/2018 CLINICAL DATA:  Fall EXAM: LEFT ANKLE - 2 VIEW COMPARISON:  None. FINDINGS: There is no evidence of fracture, dislocation, or joint effusion. There is no evidence of arthropathy or other focal bone abnormality. Soft tissues are unremarkable. IMPRESSION: Negative. Electronically Signed   By: Ulyses Jarred M.D.   On: 12/21/2018 19:56   Dg Ankle 2 Views Right  Result Date: 12/21/2018 CLINICAL DATA:  Fall EXAM: RIGHT ANKLE - 2 VIEW COMPARISON:  None. FINDINGS: Nondisplaced fracture of the distal right fibula with mild soft tissue swelling. Ankle mortise remains approximated. No distal tibia fracture. IMPRESSION: Nondisplaced fracture of the distal right fibula with overlying soft tissue swelling. Electronically Signed   By: Ulyses Jarred M.D.   On: 12/21/2018 19:57   Dg Chest Port 1 View  Result Date: 12/21/2018 CLINICAL DATA:  Stated history of postop. Patient reports preop knee surgery. EXAM: PORTABLE CHEST 1 VIEW COMPARISON:  Radiographs 10/13/2018 FINDINGS: The cardiomediastinal contours are unchanged. Streaky left lung base scarring, unchanged from prior. Pulmonary vasculature is normal. No consolidation, pleural effusion, or pneumothorax. No acute osseous abnormalities are seen. IMPRESSION: Left lung base scarring.  No acute chest findings. Electronically Signed   By: Keith Rake M.D.   On: 12/21/2018 20:21   Dg Knee Complete 4 Views Left  Result Date: 12/21/2018 CLINICAL DATA:  Fall with knee  pain EXAM: LEFT KNEE - COMPLETE 4+ VIEW COMPARISON:  None. FINDINGS: Oblique fracture of the proximal left  tibia with mild medial displacement. No intra-articular extension. There is also a comminuted fracture of the proximal shaft of the left fibula. Minimal displacement. The knee remains approximated. IMPRESSION: Extra-articular fractures of the proximal tibia and fibula, with mild medial displacement of the tibia fracture. Electronically Signed   By: Ulyses Jarred M.D.   On: 12/21/2018 19:54   Dg Knee Complete 4 Views Right  Result Date: 12/21/2018 CLINICAL DATA:  Trip and fall onto knee, deformity. EXAM: RIGHT KNEE - COMPLETE 4+ VIEW COMPARISON:  None. FINDINGS: Acute mildly depressed lateral tibial plateau fracture. Linear lucency through medial tibial plateau. No dislocation. No destructive bony lesions. Osteopenia. Lipohemarthrosis suspected. IMPRESSION: 1. Acute depressed lateral tibial plateau fracture. Potential nondisplaced medial plateau component. No dislocation. 2. Osteopenia decreases sensitivity for acute nondisplaced fractures. Electronically Signed   By: Elon Alas M.D.   On: 12/21/2018 19:57    Chart has been reviewed    Assessment/Plan  62 y.o. female with medical history significant of this post renal transplant  (h/o polycystic kidney)/CKDIII., asthma, chronic diastolic CHF  Admitted for bilateral knee fractures  Present on Admission: . Bilateral tibial fractures, closed, initial encounter - - management as per orthopedics and trauma surgery who plan to operate   in  a.m.     Keep nothing by mouth post midnight. Patient  not on anticoagulation or antiplatelet agents    Ordered type and screen, Place Foley, order a vitamin D level  Patient at baseline  unable to walk a flight of stairs or 100 feet   due to   joint pain     Patient denies any chest pain currently or shortness of breath currently and/or with exertion,  ECG showing no evidence of acute ischemia    known history of   CKD  Given sense of medical disease patient is at least moderate  risk   which has been discussed with family but at this point no furthther cardiac workup is indicated.        . Chronic diastolic CHF (congestive heart failure) (HCC) rate 1 diastolic dysfunction remotely currently appears to be asymptomatic continue to monitor  . Closed right ankle fracture as per orthopedics/trauma surgery plan to operate in a.m.   [name] Asthma, mild intermittent -continue albuterol as needed currently appears to be stable  Other plan as per orders.  DVT prophylaxis:  SCD    Code Status:  FULL CODE as per patient   I had personally discussed CODE STATUS with patient and family  Family Communication:   Family   at  Bedside  plan of care was discussed with      Husband,   Disposition Plan:    likely will need placement for rehabilitation                    Social Work  consulted                                     Consults called: Discussed with Dr. Lyla Glassing with orthopedics who recommends transfer to [facility] trauma surgery will see in consult with plan to operate in a.m.  Admission status:   inpatient     Expect 2 midnight stay secondary to severity of patient's current illness including   Severe lab/radiological/exam abnormalities including: Bilateral knee fractures   and extensive comorbidities including: History of renal transplant   CKD  That are currently  affecting medical management.   I expect  patient to be hospitalized for 2 midnights requiring inpatient medical care.  Patient is at high risk for adverse outcome (such as loss of life or disability) if not treated.  Indication for inpatient stay as follows:     severe pain requiring acute inpatient management,  inability to maintain oral hydration    Need for operative/procedural  intervention    Need for I  IV fluids IV pain medications    Level of care      medical floor            Toy Baker 12/21/2018, 10:55 PM    [facility]     after 2 AM please page floor coverage PA If 7AM-7PM, please contact the day team taking care of the patient using [email or web address]

## 2018-12-21 NOTE — ED Notes (Signed)
Patient transported to X-ray 

## 2018-12-22 ENCOUNTER — Other Ambulatory Visit: Payer: Self-pay

## 2018-12-22 DIAGNOSIS — S82101A Unspecified fracture of upper end of right tibia, initial encounter for closed fracture: Secondary | ICD-10-CM

## 2018-12-22 DIAGNOSIS — S82102A Unspecified fracture of upper end of left tibia, initial encounter for closed fracture: Secondary | ICD-10-CM

## 2018-12-22 LAB — TYPE AND SCREEN
ABO/RH(D): A NEG
Antibody Screen: NEGATIVE

## 2018-12-22 LAB — CBC
HCT: 40.2 % (ref 36.0–46.0)
Hemoglobin: 11.8 g/dL — ABNORMAL LOW (ref 12.0–15.0)
MCH: 28.3 pg (ref 26.0–34.0)
MCHC: 29.4 g/dL — ABNORMAL LOW (ref 30.0–36.0)
MCV: 96.4 fL (ref 80.0–100.0)
Platelets: 191 10*3/uL (ref 150–400)
RBC: 4.17 MIL/uL (ref 3.87–5.11)
RDW: 18.4 % — ABNORMAL HIGH (ref 11.5–15.5)
WBC: 14.5 10*3/uL — ABNORMAL HIGH (ref 4.0–10.5)
nRBC: 0 % (ref 0.0–0.2)

## 2018-12-22 LAB — BASIC METABOLIC PANEL
ANION GAP: 10 (ref 5–15)
BUN: 26 mg/dL — ABNORMAL HIGH (ref 8–23)
CO2: 24 mmol/L (ref 22–32)
Calcium: 9.4 mg/dL (ref 8.9–10.3)
Chloride: 103 mmol/L (ref 98–111)
Creatinine, Ser: 1.27 mg/dL — ABNORMAL HIGH (ref 0.44–1.00)
GFR calc Af Amer: 53 mL/min — ABNORMAL LOW (ref >60)
GFR calc non Af Amer: 46 mL/min — ABNORMAL LOW (ref >60)
Glucose, Bld: 209 mg/dL — ABNORMAL HIGH (ref 70–99)
Potassium: 4.4 mmol/L (ref 3.5–5.1)
Sodium: 137 mmol/L (ref 135–145)

## 2018-12-22 LAB — SURGICAL PCR SCREEN
MRSA, PCR: NEGATIVE
Staphylococcus aureus: NEGATIVE

## 2018-12-22 LAB — ABO/RH: ABO/RH(D): A NEG

## 2018-12-22 MED ORDER — CEFAZOLIN SODIUM-DEXTROSE 2-4 GM/100ML-% IV SOLN
2.0000 g | INTRAVENOUS | Status: AC
  Administered 2018-12-23: 2 g via INTRAVENOUS
  Filled 2018-12-22 (×2): qty 100

## 2018-12-22 MED ORDER — PREDNISOLONE ACETATE 1 % OP SUSP
1.0000 [drp] | Freq: Every day | OPHTHALMIC | Status: DC
  Administered 2018-12-22 – 2018-12-29 (×7): 1 [drp] via OPHTHALMIC

## 2018-12-22 MED ORDER — HYDROMORPHONE HCL 1 MG/ML IJ SOLN
1.0000 mg | Freq: Once | INTRAMUSCULAR | Status: AC
  Administered 2018-12-22: 1 mg via INTRAVENOUS
  Filled 2018-12-22: qty 1

## 2018-12-22 MED ORDER — HYDROCODONE-ACETAMINOPHEN 5-325 MG PO TABS: 1.0000 | ORAL_TABLET | Freq: Four times a day (QID) | ORAL | Status: DC | PRN

## 2018-12-22 MED ORDER — HEPARIN SODIUM (PORCINE) 5000 UNIT/ML IJ SOLN
5000.0000 [IU] | Freq: Three times a day (TID) | INTRAMUSCULAR | Status: AC
  Administered 2018-12-22 (×2): 5000 [IU] via SUBCUTANEOUS
  Filled 2018-12-22 (×2): qty 1

## 2018-12-22 MED ORDER — MIDAZOLAM HCL 2 MG/2ML IJ SOLN
2.0000 mg | Freq: Once | INTRAMUSCULAR | Status: AC
  Administered 2018-12-22: 2 mg via INTRAVENOUS
  Filled 2018-12-22: qty 2

## 2018-12-22 MED ORDER — MORPHINE SULFATE (PF) 2 MG/ML IV SOLN
0.5000 mg | INTRAVENOUS | Status: DC | PRN
  Administered 2018-12-22 (×2): 0.5 mg via INTRAVENOUS
  Filled 2018-12-22 (×2): qty 1

## 2018-12-22 MED ORDER — BRIMONIDINE TARTRATE 0.15 % OP SOLN
1.0000 [drp] | Freq: Two times a day (BID) | OPHTHALMIC | Status: DC
  Administered 2018-12-22 – 2018-12-29 (×14): 1 [drp] via OPHTHALMIC
  Filled 2018-12-22: qty 5

## 2018-12-22 MED ORDER — MYCOPHENOLATE SODIUM 180 MG PO TBEC
180.0000 mg | DELAYED_RELEASE_TABLET | Freq: Two times a day (BID) | ORAL | Status: DC
  Administered 2018-12-22 – 2018-12-29 (×14): 180 mg via ORAL
  Filled 2018-12-22 (×18): qty 1

## 2018-12-22 MED ORDER — PREDNISONE 5 MG PO TABS
5.0000 mg | ORAL_TABLET | Freq: Every day | ORAL | Status: DC
  Administered 2018-12-24 – 2018-12-29 (×6): 5 mg via ORAL
  Filled 2018-12-22 (×6): qty 1

## 2018-12-22 MED ORDER — PROMETHAZINE HCL 25 MG PO TABS: 12.5000 mg | ORAL_TABLET | Freq: Four times a day (QID) | ORAL | Status: DC | PRN

## 2018-12-22 MED ORDER — ALBUTEROL SULFATE (2.5 MG/3ML) 0.083% IN NEBU: 2.5000 mg | INHALATION_SOLUTION | Freq: Four times a day (QID) | RESPIRATORY_TRACT | Status: DC | PRN

## 2018-12-22 MED ORDER — CHLORHEXIDINE GLUCONATE 4 % EX LIQD
60.0000 mL | Freq: Once | CUTANEOUS | Status: AC
  Administered 2018-12-23: 4 via TOPICAL

## 2018-12-22 MED ORDER — METHOCARBAMOL 1000 MG/10ML IJ SOLN: 500.0000 mg | Freq: Four times a day (QID) | INTRAVENOUS | Status: DC | PRN

## 2018-12-22 MED ORDER — PREDNISOLONE ACETATE 1 % OP SUSP
1.0000 [drp] | Freq: Four times a day (QID) | OPHTHALMIC | Status: DC
  Administered 2018-12-22 – 2018-12-29 (×27): 1 [drp] via OPHTHALMIC
  Filled 2018-12-22: qty 5

## 2018-12-22 MED ORDER — SENNA 8.6 MG PO TABS
1.0000 | ORAL_TABLET | Freq: Every day | ORAL | Status: DC
  Administered 2018-12-22 – 2018-12-24 (×3): 8.6 mg via ORAL
  Filled 2018-12-22 (×3): qty 1

## 2018-12-22 MED ORDER — POLYETHYLENE GLYCOL 3350 17 G PO PACK
17.0000 g | PACK | Freq: Every day | ORAL | Status: DC
  Administered 2018-12-22 – 2018-12-25 (×3): 17 g via ORAL
  Filled 2018-12-22 (×3): qty 1

## 2018-12-22 MED ORDER — LACTATED RINGERS IV SOLN
INTRAVENOUS | Status: AC
  Administered 2018-12-22: 17:00:00 via INTRAVENOUS

## 2018-12-22 MED ORDER — PREDNISONE 5 MG PO TABS
5.0000 mg | ORAL_TABLET | Freq: Every day | ORAL | Status: AC
  Administered 2018-12-23: 5 mg via ORAL
  Filled 2018-12-22: qty 1

## 2018-12-22 MED ORDER — MORPHINE SULFATE (PF) 2 MG/ML IV SOLN
2.0000 mg | INTRAVENOUS | Status: DC | PRN
  Administered 2018-12-22 – 2018-12-23 (×5): 2 mg via INTRAVENOUS
  Filled 2018-12-22 (×5): qty 1

## 2018-12-22 MED ORDER — METHOCARBAMOL 500 MG PO TABS
500.0000 mg | ORAL_TABLET | Freq: Four times a day (QID) | ORAL | Status: DC | PRN
  Administered 2018-12-22 – 2018-12-27 (×10): 500 mg via ORAL
  Filled 2018-12-22 (×11): qty 1

## 2018-12-22 MED ORDER — POVIDONE-IODINE 10 % EX SWAB: 2.0000 "application " | Freq: Once | CUTANEOUS | Status: DC

## 2018-12-22 MED ORDER — ONDANSETRON HCL 4 MG/2ML IJ SOLN
4.0000 mg | Freq: Four times a day (QID) | INTRAMUSCULAR | Status: DC | PRN
  Administered 2018-12-22 – 2018-12-24 (×4): 4 mg via INTRAVENOUS
  Filled 2018-12-22 (×4): qty 2

## 2018-12-22 MED ORDER — HYDROCODONE-ACETAMINOPHEN 5-325 MG PO TABS
1.0000 | ORAL_TABLET | ORAL | Status: DC | PRN
  Administered 2018-12-23: 2 via ORAL
  Administered 2018-12-23: 1 via ORAL
  Administered 2018-12-24 (×3): 2 via ORAL
  Administered 2018-12-25 – 2018-12-27 (×4): 1 via ORAL
  Administered 2018-12-28: 2 via ORAL
  Administered 2018-12-29 (×2): 1 via ORAL
  Filled 2018-12-22: qty 1
  Filled 2018-12-22: qty 2
  Filled 2018-12-22 (×4): qty 1
  Filled 2018-12-22: qty 2
  Filled 2018-12-22 (×2): qty 1
  Filled 2018-12-22 (×3): qty 2
  Filled 2018-12-22: qty 1

## 2018-12-22 MED ORDER — LATANOPROST 0.005 % OP SOLN
1.0000 [drp] | Freq: Every day | OPHTHALMIC | Status: DC
  Administered 2018-12-22 – 2018-12-28 (×7): 1 [drp] via OPHTHALMIC
  Filled 2018-12-22: qty 2.5

## 2018-12-22 MED ORDER — CYCLOSPORINE 0.05 % OP EMUL
1.0000 [drp] | Freq: Two times a day (BID) | OPHTHALMIC | Status: DC
  Administered 2018-12-22 – 2018-12-29 (×14): 1 [drp] via OPHTHALMIC
  Filled 2018-12-22 (×17): qty 1

## 2018-12-22 MED ORDER — MOMETASONE FURO-FORMOTEROL FUM 200-5 MCG/ACT IN AERO
2.0000 | INHALATION_SPRAY | Freq: Two times a day (BID) | RESPIRATORY_TRACT | Status: DC
  Administered 2018-12-22 – 2018-12-29 (×14): 2 via RESPIRATORY_TRACT
  Filled 2018-12-22: qty 8.8

## 2018-12-22 NOTE — Progress Notes (Signed)
Orthopedic Tech Progress Note Patient Details:  April [name] 03-11-1956 [id number] Assisted Dr. With K.I    Ortho Devices Type of Ortho Device: Knee Immobilizer Ortho Device/Splint Location: left knee Ortho Device/Splint Interventions: Adjustment, Application, Ordered   Post Interventions Patient Tolerated: Well Instructions Provided: Care of device, Adjustment of device   Janit Pagan 12/22/2018, 11:03 AM

## 2018-12-22 NOTE — ED Notes (Signed)
[facility] on site to transport patient

## 2018-12-22 NOTE — Consult Note (Signed)
Reason for Consult:Bilateral tibia fxs Referring Physician: A Anyelin Mogle is an 63 y.o. female.  HPI: April [name] tripped at home and fell. She felt a pop in her knees and had immediate pain. She could not get up or ambulate. She was brought to [facility] where x-rays showed bilateral proximal tibia fxs. She was transferred to [facility]. She does not work and lives at home with her husband, son, and mother.  Past Medical History:  Diagnosis Date  . Arthritis   . Asthma   . Depression   . GERD (gastroesophageal reflux disease)   . Hyperlipidemia   . Hyperparathyroidism   . Polycystic kidney   . PONV (postoperative nausea and vomiting)     Past Surgical History:  Procedure Laterality Date  . 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 BILATERAL OOPHORECTOMY    . CARPAL TUNNEL RELEASE  2000  . CESAREAN SECTION    . INSERTION OF DIALYSIS CATHETER  03/31/2012   Procedure: INSERTION OF DIALYSIS CATHETER;  Surgeon: Angelia Mould, MD;  Location: [place];  Service: Vascular;  Laterality: N/A;  insertion of dialysis catheter right internal jugular vein  . 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 TONSILLECTOMY  1967  . TUBAL LIGATION  2010    Family History  Problem Relation Age of Onset  . Hypertension Mother   . Heart disease Father        CABG history  . Polycystic kidney disease Father   . Polycystic kidney disease Brother     Social History:  reports that she quit smoking about 20 years ago. Her smoking use included cigarettes. She has a 3.75 pack-year smoking history. She has never used smokeless tobacco. She reports that she does not drink alcohol or use drugs.  Allergies:  Allergies  Allergen Reactions  . Infed [Iron Dextran] Other (See Comments)    Chest tightness  . Pentamidine Itching, Shortness Of Breath and Swelling  . Erythromycin [Erythromycin] Other (See Comments)    Mouth Ulcers  . Oxycodone Nausea Only and Nausea And Vomiting  .  Erythromycin Rash    Causes breakout in mouth  . Ultram [Tramadol Hcl] Anxiety    Medications: I have reviewed the patient's current medications.  Results for orders placed or performed during the hospital encounter of 12/21/18 (from the past 48 hour(s))  CBC with Differential/Platelet     Status: Abnormal   Collection Time: 12/21/18  7:59 PM  Result Value Ref Range   WBC 9.1 4.0 - 10.5 K/uL   RBC 3.99 3.87 - 5.11 MIL/uL   Hemoglobin 11.5 (L) 12.0 - 15.0 g/dL   HCT 40.0 36.0 - 46.0 %   MCV 100.3 (H) 80.0 - 100.0 fL   MCH 28.8 26.0 - 34.0 pg   MCHC 28.8 (L) 30.0 - 36.0 g/dL   RDW 18.5 (H) 11.5 - 15.5 %   Platelets 168 150 - 400 K/uL   nRBC 0.0 0.0 - 0.2 %   Neutrophils Relative % 88 %   Neutro Abs 8.1 (H) 1.7 - 7.7 K/uL   Lymphocytes Relative 7 %   Lymphs Abs 0.6 (L) 0.7 - 4.0 K/uL   Monocytes Relative 3 %   Monocytes Absolute 0.3 0.1 - 1.0 K/uL   Eosinophils Relative 1 %   Eosinophils Absolute 0.1 0.0 - 0.5 K/uL   Basophils Relative 0 %   Basophils Absolute 0.0 0.0 - 0.1 K/uL   Immature Granulocytes 1 %  Abs Immature Granulocytes 0.05 0.00 - 0.07 K/uL    Comment: Performed at [facility], [place] [address] [place], [place] [address]  Comprehensive metabolic panel     Status: Abnormal   Collection Time: 12/21/18  7:59 PM  Result Value Ref Range   Sodium 136 135 - 145 mmol/L   Potassium 4.0 3.5 - 5.1 mmol/L   Chloride 104 98 - 111 mmol/L   CO2 24 22 - 32 mmol/L   Glucose, Bld 166 (H) 70 - 99 mg/dL   BUN 31 (H) 8 - 23 mg/dL   Creatinine, Ser 1.18 (H) 0.44 - 1.00 mg/dL   Calcium 9.1 8.9 - 10.3 mg/dL   Total Protein 7.3 6.5 - 8.1 g/dL   Albumin 3.4 (L) 3.5 - 5.0 g/dL   AST 22 15 - 41 U/L   ALT 15 0 - 44 U/L   Alkaline Phosphatase 67 38 - 126 U/L   Total Bilirubin 1.1 0.3 - 1.2 mg/dL   GFR calc non Af Amer 50 (L) >60 mL/min   GFR calc Af Amer 58 (L) >60 mL/min   Anion gap 8 5 - 15    Comment: Performed at [facility], [place]  [address], [place], [place] [address]  Surgical pcr screen     Status: None   Collection Time: 12/22/18  1:29 AM  Result Value Ref Range   MRSA, PCR NEGATIVE NEGATIVE   Staphylococcus aureus NEGATIVE NEGATIVE    Comment: (NOTE) The Xpert SA Assay (FDA approved for NASAL specimens in patients 82 years of age and older), is one component of a comprehensive surveillance program. It is not intended to diagnose infection nor to guide or monitor treatment. Performed at [facility], [place] [address], [place], [place] [address]   CBC     Status: Abnormal   Collection Time: 12/22/18  2:09 AM  Result Value Ref Range   WBC 14.5 (H) 4.0 - 10.5 K/uL   RBC 4.17 3.87 - 5.11 MIL/uL   Hemoglobin 11.8 (L) 12.0 - 15.0 g/dL   HCT 40.2 36.0 - 46.0 %   MCV 96.4 80.0 - 100.0 fL   MCH 28.3 26.0 - 34.0 pg   MCHC 29.4 (L) 30.0 - 36.0 g/dL   RDW 18.4 (H) 11.5 - 15.5 %   Platelets 191 150 - 400 K/uL   nRBC 0.0 0.0 - 0.2 %    Comment: Performed at [facility], [place] [address] [place], [place] [address]  Basic metabolic panel     Status: Abnormal   Collection Time: 12/22/18  2:09 AM  Result Value Ref Range   Sodium 137 135 - 145 mmol/L   Potassium 4.4 3.5 - 5.1 mmol/L   Chloride 103 98 - 111 mmol/L   CO2 24 22 - 32 mmol/L   Glucose, Bld 209 (H) 70 - 99 mg/dL   BUN 26 (H) 8 - 23 mg/dL   Creatinine, Ser 1.27 (H) 0.44 - 1.00 mg/dL   Calcium 9.4 8.9 - 10.3 mg/dL   GFR calc non Af Amer 46 (L) >60 mL/min   GFR calc Af Amer 53 (L) >60 mL/min   Anion gap 10 5 - 15    Comment: Performed at [facility] [address], [place], [place] [address]  Type and screen     Status: None   Collection Time: 12/22/18  2:10 AM  Result Value Ref Range   ABO/RH(D) A NEG    Antibody  Screen      NEG Performed at [facility], [place] [address] [place], [place] [address]    Sample Expiration 12/25/2018   ABO/Rh     Status: None   Collection Time: 12/22/18  2:10 AM  Result Value Ref Range    ABO/RH(D)      A NEG Performed at [facility] [address], [place], [place] [address]     Dg Wrist Complete Right  Result Date: 12/21/2018 CLINICAL DATA:  Trip and fall RIGHT wrist pain. EXAM: RIGHT WRIST - COMPLETE 3+ VIEW COMPARISON:  RIGHT wrist radiograph October 13, 2014. FINDINGS: There is no evidence of fracture or dislocation. There is no evidence of arthropathy or other focal bone abnormality. Osteopenia. Intravenous catheter in place. Soft tissues are unremarkable. IMPRESSION: 1. No acute fracture deformity or dislocation. 2. Osteopenia decreases sensitivity for acute nondisplaced fractures. Electronically Signed   By: Elon Alas M.D.   On: 12/21/2018 19:58   Dg Ankle 2 Views Left  Result Date: 12/21/2018 CLINICAL DATA:  Fall EXAM: LEFT ANKLE - 2 VIEW COMPARISON:  None. FINDINGS: There is no evidence of fracture, dislocation, or joint effusion. There is no evidence of arthropathy or other focal bone abnormality. Soft tissues are unremarkable. IMPRESSION: Negative. Electronically Signed   By: Ulyses Jarred M.D.   On: 12/21/2018 19:56   Dg Ankle 2 Views Right  Result Date: 12/21/2018 CLINICAL DATA:  Fall EXAM: RIGHT ANKLE - 2 VIEW COMPARISON:  None. FINDINGS: Nondisplaced fracture of the distal right fibula with mild soft tissue swelling. Ankle mortise remains approximated. No distal tibia fracture. IMPRESSION: Nondisplaced fracture of the distal right fibula with overlying soft tissue swelling. Electronically Signed   By: Ulyses Jarred M.D.   On: 12/21/2018 19:57   Ct Knee Left Wo Contrast  Result Date: 12/21/2018 CLINICAL DATA:  Post fall with bilateral knee pain. Bilateral knee fractures. EXAM: CT OF THE LEFT KNEE WITHOUT CONTRAST TECHNIQUE: Multidetector CT imaging of the LEFT knee was performed according to the standard protocol. Multiplanar CT image reconstructions were also generated. COMPARISON:  Radiographs earlier this day. FINDINGS: Bones/Joint/Cartilage  Comminuted displaced fracture of the proximal tibial metaphysis. Displacement anteriorly of approximately 9 mm. Fracture approaches but does not extend to the articular surface laterally. There is fracture involvement of the tibial attachment of the patellar tendon. Comminuted displaced proximal fibular diaphyseal fracture. No involvement of the fibular head. Patella and distal femur are intact. There is no knee joint effusion. Ligaments Suboptimally assessed by CT. ACL and PCL fibers are visualized. Muscles and Tendons Hematoma superficial to the patellar tendon, with fracture at the tibial insertion. Quadriceps tendon is intact. No intramuscular collection. Soft tissues Soft tissue hematoma anteriorly extending from the patellar tendon to the proximal tibial diaphysis. IMPRESSION: 1. Comminuted displaced fracture of the proximal tibial metaphysis. Displacement anteriorly of approximately 9 mm, involving the patellar tendon insertion. Fracture approaches but does not extend to the articular surface laterally. 2. Comminuted displaced proximal fibular diaphyseal fracture. Electronically Signed   By: Keith Rake M.D.   On: 12/21/2018 22:56   Ct Knee Right Wo Contrast  Result Date: 12/21/2018 CLINICAL DATA:  Trip and fall with bilateral knee pain. Bilateral knee fractures. EXAM: CT OF THE RIGHT KNEE WITHOUT CONTRAST TECHNIQUE: Multidetector CT imaging of the RIGHT knee was performed according to the standard protocol. Multiplanar CT image reconstructions were also generated. COMPARISON:  Radiographs earlier this day. FINDINGS: Bones/Joint/Cartilage Comminuted tibial plateau fracture with dominant fracture in  the lateral tibial plateau which is decompressed, articular depression of approximately 7 mm. Fracture extends through the tibial spines with suspected nondisplaced posteromedial compartment involvement. No metaphyseal fracture plane. Proximal fibula, patella, and distal femur are intact without additional  acute fracture. Moderately point hemarthrosis. Ligaments Suboptimally assessed by CT. PCL fibers are visualized, attenuated ACL fibers tentatively visualized. Muscles and Tendons Quadriceps and patellar tendons are intact. No evidence of intramuscular hematoma. Soft tissues Mild subcutaneous soft tissue edema. IMPRESSION: Comminuted lateral tibial plateau fracture with articular depression of approximately 7 mm. Fracture extends through the tibial spines with suspected nondisplaced posteromedial compartment involvement. Electronically Signed   By: Keith Rake M.D.   On: 12/21/2018 22:51   Dg Chest Port 1 View  Result Date: 12/21/2018 CLINICAL DATA:  Stated history of postop. Patient reports preop knee surgery. EXAM: PORTABLE CHEST 1 VIEW COMPARISON:  Radiographs 10/13/2018 FINDINGS: The cardiomediastinal contours are unchanged. Streaky left lung base scarring, unchanged from prior. Pulmonary vasculature is normal. No consolidation, pleural effusion, or pneumothorax. No acute osseous abnormalities are seen. IMPRESSION: Left lung base scarring.  No acute chest findings. Electronically Signed   By: Keith Rake M.D.   On: 12/21/2018 20:21   Dg Knee Complete 4 Views Left  Result Date: 12/21/2018 CLINICAL DATA:  Fall with knee pain EXAM: LEFT KNEE - COMPLETE 4+ VIEW COMPARISON:  None. FINDINGS: Oblique fracture of the proximal left tibia with mild medial displacement. No intra-articular extension. There is also a comminuted fracture of the proximal shaft of the left fibula. Minimal displacement. The knee remains approximated. IMPRESSION: Extra-articular fractures of the proximal tibia and fibula, with mild medial displacement of the tibia fracture. Electronically Signed   By: Ulyses Jarred M.D.   On: 12/21/2018 19:54   Dg Knee Complete 4 Views Right  Result Date: 12/21/2018 CLINICAL DATA:  Trip and fall onto knee, deformity. EXAM: RIGHT KNEE - COMPLETE 4+ VIEW COMPARISON:  None. FINDINGS: Acute  mildly depressed lateral tibial plateau fracture. Linear lucency through medial tibial plateau. No dislocation. No destructive bony lesions. Osteopenia. Lipohemarthrosis suspected. IMPRESSION: 1. Acute depressed lateral tibial plateau fracture. Potential nondisplaced medial plateau component. No dislocation. 2. Osteopenia decreases sensitivity for acute nondisplaced fractures. Electronically Signed   By: Elon Alas M.D.   On: 12/21/2018 19:57    Review of Systems  Constitutional: Negative for weight loss.  HENT: Negative for ear discharge, ear pain, hearing loss and tinnitus.   Eyes: Negative for blurred vision, double vision, photophobia and pain.  Respiratory: Negative for cough, sputum production and shortness of breath.   Cardiovascular: Negative for chest pain.  Gastrointestinal: Negative for abdominal pain, nausea and vomiting.  Genitourinary: Negative for dysuria, flank pain, frequency and urgency.  Musculoskeletal: Positive for joint pain (Bilateral knees). Negative for back pain, falls, myalgias and neck pain.  Neurological: Negative for dizziness, tingling, sensory change, focal weakness, loss of consciousness and headaches.  Endo/Heme/Allergies: Does not bruise/bleed easily.  Psychiatric/Behavioral: Negative for depression, memory loss and substance abuse. The patient is not nervous/anxious.    Blood pressure (!) 153/81, pulse 95, temperature 97.8 F (36.6 C), temperature source Oral, resp. rate 16, height 5' 1.2" (1.554 m), weight 82.7 kg, SpO2 99 %. Physical Exam  Constitutional: She appears well-developed and well-nourished. No distress.  HENT:  Head: Normocephalic and atraumatic.  Eyes: Conjunctivae are normal. Right eye exhibits no discharge. Left eye exhibits no discharge. No scleral icterus.  Neck: Normal range of motion.  Cardiovascular: Normal rate and regular rhythm.  Respiratory:  Effort normal. No respiratory distress.  Musculoskeletal:     Comments: RLE No  traumatic wounds, ecchymosis, or rash  KI in place  No ankle effusion  Sens DPN, SPN, TN intact  Motor EHL, ext, flex, evers 5/5  DP 2+, PT 0, No significant edema  LLE No traumatic wounds, ecchymosis, or rash  Knee bent at 45 degree angle  No knee or ankle effusion  Sens DPN, SPN, TN intact  Motor EHL, ext, flex, evers 5/5  DP 2+, PT 0, No significant edema  Neurological: She is alert.  Skin: Skin is warm and dry. She is not diaphoretic.  Psychiatric: She has a normal mood and affect. Her behavior is normal.    Assessment/Plan: Fall Right tibia plateau fx -- Plan for ORIF tomorrow by Dr. Doreatha Martin. NPO after MN. Will be NWB following surgery. Left tib/fib fxs -- Plan for IMN tomorrow. Will be WBAT following surgery. Multiple medical problems including PCKD s/p renal transplant, asthma, and CHF -- per primary service    Lisette Abu, PA-C Orthopedic Surgery [phone] 12/22/2018, 9:42 AM

## 2018-12-22 NOTE — Progress Notes (Signed)
**Note De-Identified vi Obfusction** PROGRESS NOTE    April [name]  AUQ:[id number] DOB: 21-My-1957 DOA: 12/21/2018 PCP: Cri Crwy, MD   Brief Nrrtive:  April [name] is  63 y.o. femle with medicl history significnt of this post renl trnsplnt (h/o polycystic kidney disese), chronic kidney disese stge III, sthm, chronic distolic CHF who presented fter  mechnicl fll found to hve bilterl knee frctures.   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  Bowel regimen.  Ortho plnning to discuss ntirejection meds with nephrology given risk of infection Follow vitmin D level RCRI 0.  No dditionl crdic w/u indicted t this point.  Discussed by previous MD with pt. As prednisone indicted for renl trnsplnt nd not primry drenl insufficiency, will continue current PO dose      Hx Renl Trnsplnt  Hx PCKD  CKD Continue prednisone, tcrolimus, nd mycophenolte Orthopedics plnning to discuss with renl s noted bove  Asthm  O2 Requirement:  No evidence of excerbtion Wen O2 s tolerted, dditionl w/u s needed  HFpEF: Per previous provider, pt with grde 1 distolic dysfunction.  I don't see echo in chrt.  In cre everywhere, she hs unremrkble echo from 2013.    Nuse: possibly relted to pin meds bove (oxy cuses nuse for her).  Prn ntiemetics.  Leukocytosis: likely rective  DVT prophylxis: heprin for tody, hold tomorrow prior to surgery Code Sttus: full  Fmily Communiction: none t  bedside Disposition Pln: pending further improvement  Consultnts:   orthopedics  Procedures:   none  Antimicrobils:  Anti-infectives (From dmission, onwrd)   Strt     Dose/Rte Route Frequency Ordered Stop   12/23/18 0615  ceFAZolin (ANCEF) IVPB 2g/100 mL premix     2 g 200 mL/hr over 30 Minutes Intrvenous On cll to O.R. 12/22/18 1147 12/24/18 0559     Subjective: C/o nuse, pin  Objective: Vitls:   12/22/18 0035 12/22/18 0131 12/22/18 0139 12/22/18 0522  BP: (!) 165/77 (!) 159/68  (!) 153/81  Pulse: 96 99  95  Resp: (!) 23 16  16   Temp:  98.7 F (37.1 C)  97.8 F (36.6 C)  TempSrc:  Orl  Orl  SpO2: 91% 95%  99%  Weight:   82.7 kg   Height:   5' 1.2" (1.554 m)     Intke/Output Summry (Lst 24 hours) t 12/22/2018 0934 Lst dt filed t 12/21/2018 2253 Gross per 24 hour  Intke 68.58 ml  Output -  Net 68.58 ml   Filed Weights   12/22/18 0139  Weight: 82.7 kg    Exmintion:  Generl exm: Appers clm nd comfortble  Respirtory system: Cler to usculttion. Respirtory effort norml. Crdiovsculr system: S1 & S2 herd, RRR.  Gstrointestinl system: Abdomen is nondistended, soft nd nontender.  Centrl nervous system: Alert nd oriented. No focl neurologicl deficits. Extremities: L knee flexed, R knee in brce Skin: No rshes, lesions or ulcers Psychitry: Judgement nd insight pper norml. Mood & ffect pproprite.     Dt Reviewed: I hve personlly reviewed following lbs nd imging studies  CBC: Recent Lbs  Lb 12/21/18 1959 **Note De-Identified vi Obfusction** 12/22/18 0209  WBC 9.1 14.5*  NEUTROBS 8.1*  --   HGB 11.5* 11.8*  HCT 40.0 40.2  MCV 100.3* 96.4  PLT 168 789   Bsic Metbolic Pnel: Recent Lbs  Lb 12/21/18 1959 12/22/18 0209  N 136 137  K 4.0 4.4  CL 104 103  CO2 24 24  GLUCOSE 166* 209*  BUN 31* 26*  CRETININE 1.18* 1.27*  CLCIUM 9.1 9.4   GFR: Estimted Cretinine Clernce: 45.6 mL/min () (by [place] formul bsed  on SCr of 1.27 mg/dL (H)). Liver Function Tests: Recent Lbs  Lb 12/21/18 1959  ST 22  LT 15  LKPHOS 67  BILITOT 1.1  PROT 7.3  LBUMIN 3.4*   No results for input(s): LIPSE, MYLSE in the lst 168 hours. No results for input(s): MMONI in the lst 168 hours. Cogultion Profile: No results for input(s): INR, PROTIME in the lst 168 hours. Crdic Enzymes: No results for input(s): CKTOTL, CKMB, CKMBINDEX, TROPONINI in the lst 168 hours. BNP (lst 3 results) No results for input(s): PROBNP in the lst 8760 hours. Hb1C: No results for input(s): HGB1C in the lst 72 hours. CBG: No results for input(s): GLUCP in the lst 168 hours. Lipid Profile: No results for input(s): CHOL, HDL, LDLCLC, TRIG, CHOLHDL, LDLDIRECT in the lst 72 hours. Thyroid Function Tests: No results for input(s): TSH, T4TOTL, FREET4, T3FREE, THYROIDB in the lst 72 hours. nemi Pnel: No results for input(s): VITMINB12, FOLTE, FERRITIN, TIBC, IRON, RETICCTPCT in the lst 72 hours. Sepsis Lbs: No results for input(s): PROCLCITON, LTICCIDVEN in the lst 168 hours.  Recent Results (from the pst 240 hour(s))  Surgicl pcr screen     Sttus: None   Collection Time: 12/22/18  1:29 M  Result Vlue Ref Rnge Sttus   MRS, PCR NEGTIVE NEGTIVE Finl   Stphylococcus ureus NEGTIVE NEGTIVE Finl    Comment: (NOTE) The Xpert S ssy (FD pproved for NSL specimens in ptients 15 yers of ge nd older), is one component of  comprehensive surveillnce progrm. It is not intended to dignose infection nor to guide or monitor tretment. Performed t [facility], [address], [place], [place] [address]          Rdiology Studies: Dg Wrist Complete Right  Result Dte: 12/21/2018 CLINICL DT:  Trip nd fll RIGHT wrist pin. EXM: RIGHT WRIST - COMPLETE 3+ VIEW COMPRISON:  RIGHT wrist rdiogrph October 13, 2014. FINDINGS: There is no evidence of frcture or  disloction. There is no evidence of rthropthy or other focl bone bnormlity. Osteopeni. Intrvenous ctheter in plce. Soft tissues re unremrkble. IMPRESSION: 1. No cute frcture deformity or disloction. 2. Osteopeni decreses sensitivity for cute nondisplced frctures. Electroniclly Signed   By: Elon ls M.D.   On: 12/21/2018 19:58   Dg nkle 2 Views Left  Result Dte: 12/21/2018 CLINICL DT:  Fll EXM: LEFT NKLE - 2 VIEW COMPRISON:  None. FINDINGS: There is no evidence of frcture, disloction, or joint effusion. There is no evidence of rthropthy or other focl bone bnormlity. Soft tissues re unremrkble. IMPRESSION: Negtive. Electroniclly Signed   By: Ulyses Jrred M.D.   On: 12/21/2018 19:56   Dg nkle 2 Views Right  Result Dte: 12/21/2018 CLINICL DT:  Fll EXM: RIGHT NKLE - 2 VIEW COMPRISON:  None. FINDINGS: Nondisplced frcture of the distl right fibul with mild soft tissue swelling. nkle mortise remins pproximted. No distl tibi frcture. IMPRESSION: Nondisplced frcture of the distl right fibul with overlying soft tissue swelling. **Note De-Identified vi Obfusction** Electroniclly Signed   By: Ulyses Jrred M.D.   On: 12/21/2018 19:57   Ct Knee Left Wo Contrst  Result Dte: 12/21/2018 CLINICAL DATA:  Post fll with bilterl knee pin. Bilterl knee frctures. EXAM: CT OF THE LEFT KNEE WITHOUT CONTRAST TECHNIQUE: Multidetector CT imging of the LEFT knee ws performed ccording to the stndrd protocol. Multiplnr CT imge reconstructions were lso generted. COMPARISON:  Rdiogrphs erlier this dy. FINDINGS: Bones/Joint/Crtilge Comminuted displced frcture of the proximl tibil metphysis. Displcement nteriorly of pproximtely 9 mm. Frcture pproches but does not extend to the rticulr surfce lterlly. There is frcture involvement of the tibil ttchment of the ptellr tendon. Comminuted displced proximl fibulr diphysel frcture. No involvement of the  fibulr hed. Ptell nd distl femur re intct. There is no knee joint effusion. Ligments Suboptimlly ssessed by CT. ACL nd PCL fibers re visulized. Muscles nd Tendons Hemtom superficil to the ptellr tendon, with frcture t the tibil insertion. Qudriceps tendon is intct. No intrmusculr collection. Soft tissues Soft tissue hemtom nteriorly extending from the ptellr tendon to the proximl tibil diphysis. IMPRESSION: 1. Comminuted displced frcture of the proximl tibil metphysis. Displcement nteriorly of pproximtely 9 mm, involving the ptellr tendon insertion. Frcture pproches but does not extend to the rticulr surfce lterlly. 2. Comminuted displced proximl fibulr diphysel frcture. Electroniclly Signed   By: Keith Rke M.D.   On: 12/21/2018 22:56   Ct Knee Right Wo Contrst  Result Dte: 12/21/2018 CLINICAL DATA:  Trip nd fll with bilterl knee pin. Bilterl knee frctures. EXAM: CT OF THE RIGHT KNEE WITHOUT CONTRAST TECHNIQUE: Multidetector CT imging of the RIGHT knee ws performed ccording to the stndrd protocol. Multiplnr CT imge reconstructions were lso generted. COMPARISON:  Rdiogrphs erlier this dy. FINDINGS: Bones/Joint/Crtilge Comminuted tibil plteu frcture with dominnt frcture in the lterl tibil plteu which is decompressed, rticulr depression of pproximtely 7 mm. Frcture extends through the tibil spines with suspected nondisplced posteromedil comprtment involvement. No metphysel frcture plne. Proximl fibul, ptell, nd distl femur re intct without dditionl cute frcture. Modertely point hemrthrosis. Ligments Suboptimlly ssessed by CT. PCL fibers re visulized, ttenuted ACL fibers tenttively visulized. Muscles nd Tendons Qudriceps nd ptellr tendons re intct. No evidence of intrmusculr hemtom. Soft tissues Mild subcutneous soft tissue edem. IMPRESSION: Comminuted lterl  tibil plteu frcture with rticulr depression of pproximtely 7 mm. Frcture extends through the tibil spines with suspected nondisplced posteromedil comprtment involvement. Electroniclly Signed   By: Keith Rke M.D.   On: 12/21/2018 22:51   Dg Chest Port 1 View  Result Dte: 12/21/2018 CLINICAL DATA:  Stted history of postop. Ptient reports preop knee surgery. EXAM: PORTABLE CHEST 1 VIEW COMPARISON:  Rdiogrphs 10/13/2018 FINDINGS: The crdiomedistinl contours re unchnged. Streky left lung bse scrring, unchnged from prior. Pulmonry vsculture is norml. No consolidtion, pleurl effusion, or pneumothorx. No cute osseous bnormlities re seen. IMPRESSION: Left lung bse scrring.  No cute chest findings. Electroniclly Signed   By: Keith Rke M.D.   On: 12/21/2018 20:21   Dg Knee Complete 4 Views Left  Result Dte: 12/21/2018 CLINICAL DATA:  Fll with knee pin EXAM: LEFT KNEE - COMPLETE 4+ VIEW COMPARISON:  None. FINDINGS: Oblique frcture of the proximl left tibi with mild medil displcement. No intr-rticulr extension. There is lso  comminuted frcture of the proximl shft of the left fibul. Miniml displcement. The knee remins pproximted. IMPRESSION: Extr-rticulr frctures of the proximl tibi nd fibul, with mild medil displcement of the tibi frcture. Electroniclly Signed   By: Ulyses Jarred M.D.   On: 12/21/2018 19:54   Dg Knee Complete 4 Views Right  Result Date: 12/21/2018 CLINICAL DATA:  Trip and fall onto knee, deformity. EXAM: RIGHT KNEE - COMPLETE 4+ VIEW COMPARISON:  None. FINDINGS: Acute mildly depressed lateral tibial plateau fracture. Linear lucency through medial tibial plateau. No dislocation. No destructive bony lesions. Osteopenia. Lipohemarthrosis suspected. IMPRESSION: 1. Acute depressed lateral tibial plateau fracture. Potential nondisplaced medial plateau component. No dislocation. 2. Osteopenia decreases sensitivity  for acute nondisplaced fractures. Electronically Signed   By: Elon Alas M.D.   On: 12/21/2018 19:57        Scheduled Meds: . brimonidine  1 drop Left Eye BID  . cycloSPORINE  1 drop Both Eyes BID  .  HYDROmorphone (DILAUDID) injection  1 mg Intravenous Once  . latanoprost  1 drop Both Eyes QHS  . midazolam  2 mg Intravenous Once  . mometasone-formoterol  2 puff Inhalation BID  . mycophenolate  180 mg Oral BID  . prednisoLONE acetate  1 drop Left Eye QID  . prednisoLONE acetate  1 drop Right Eye Daily  . predniSONE  2.5 mg Oral Q supper  . predniSONE  5 mg Oral Q breakfast  . tacrolimus  0.5 mg Oral QHS  . tacrolimus  1 mg Oral Daily   Continuous Infusions: . methocarbamol (ROBAXIN) IV       LOS: 1 day    Time spent: over 30 min    Fayrene Helper, MD [facility] Pager AMION  If 7PM-7AM, please contact night-coverage [email or web address] Password [facility] 12/22/2018, 9:34 AM

## 2018-12-22 NOTE — Anesthesia Preprocedure Evaluation (Addendum)
Anesthesia Evaluation  Patient identified by MRN, date of birth, ID band Patient awake    Reviewed: Allergy & Precautions, NPO status , Patient's Chart, lab work & pertinent test results  History of Anesthesia Complications (+) PONV  Airway Mallampati: II  TM Distance: >3 FB Neck ROM: Full    Dental  (+) Teeth Intact, Dental Advisory Given   Pulmonary asthma , former smoker,    breath sounds clear to auscultation       Cardiovascular +CHF   Rhythm:Regular Rate:Normal  Previous notes indicate grade 1 diastolic dysfunction, no echos in EMR   Neuro/Psych Depression negative neurological ROS     GI/Hepatic Neg liver ROS, GERD  Controlled and Medicated,  Endo/Other  negative endocrine ROS  Renal/GU Renal InsufficiencyRenal diseasePCKD s/p renal transplant in 2013, CKD3, immunosuppression with prednisone, tacrolimus, mycophenolate     Musculoskeletal  (+) Arthritis ,   Abdominal   Peds  Hematology negative hematology ROS (+)   Anesthesia Other Findings Day of surgery medications reviewed with the patient.  Reproductive/Obstetrics                           Anesthesia Physical Anesthesia Plan  ASA: III  Anesthesia Plan: General   Post-op Pain Management:    Induction: Intravenous  PONV Risk Score and Plan: 4 or greater and Treatment may vary due to age or medical condition, Ondansetron, Dexamethasone, Midazolam and Scopolamine patch - Pre-op  Airway Management Planned: Oral ETT  Additional Equipment:   Intra-op Plan:   Post-operative Plan: Extubation in OR  Informed Consent: I have reviewed the patients History and Physical, chart, labs and discussed the procedure including the risks, benefits and alternatives for the proposed anesthesia with the patient or authorized representative who has indicated his/her understanding and acceptance.     Dental advisory given  Plan Discussed  with: Anesthesiologist, CRNA and Surgeon  Anesthesia Plan Comments:       Anesthesia Quick Evaluation

## 2018-12-22 NOTE — H&P (View-Only) (Signed)
Reason for Consult:Bilateral tibia fxs Referring Physician: A [name] is an 63 y.o. female.  HPI: April [name] tripped at home and fell. She felt a pop in her knees and had immediate pain. She could not get up or ambulate. She was brought to [facility] where x-rays showed bilateral proximal tibia fxs. She was transferred to [facility]. She does not work and lives at home with her husband, son, and mother.  Past Medical History:  Diagnosis Date  . Arthritis   . Asthma   . Depression   . GERD (gastroesophageal reflux disease)   . Hyperlipidemia   . Hyperparathyroidism   . Polycystic kidney   . PONV (postoperative nausea and vomiting)     Past Surgical History:  Procedure Laterality Date  . 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 BILATERAL OOPHORECTOMY    . CARPAL TUNNEL RELEASE  2000  . CESAREAN SECTION    . INSERTION OF DIALYSIS CATHETER  03/31/2012   Procedure: INSERTION OF DIALYSIS CATHETER;  Surgeon: Angelia Mould, MD;  Location: [place];  Service: Vascular;  Laterality: N/A;  insertion of dialysis catheter right internal jugular vein  . 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 TONSILLECTOMY  1967  . TUBAL LIGATION  2010    Family History  Problem Relation Age of Onset  . Hypertension Mother   . Heart disease Father        CABG history  . Polycystic kidney disease Father   . Polycystic kidney disease Brother     Social History:  reports that she quit smoking about 20 years ago. Her smoking use included cigarettes. She has a 3.75 pack-year smoking history. She has never used smokeless tobacco. She reports that she does not drink alcohol or use drugs.  Allergies:  Allergies  Allergen Reactions  . Infed [Iron Dextran] Other (See Comments)    Chest tightness  . Pentamidine Itching, Shortness Of Breath and Swelling  . Erythromycin [Erythromycin] Other (See Comments)    Mouth Ulcers  . Oxycodone Nausea Only and Nausea And Vomiting  .  Erythromycin Rash    Causes breakout in mouth  . Ultram [Tramadol Hcl] Anxiety    Medications: I have reviewed the patient's current medications.  Results for orders placed or performed during the hospital encounter of 12/21/18 (from the past 48 hour(s))  CBC with Differential/Platelet     Status: Abnormal   Collection Time: 12/21/18  7:59 PM  Result Value Ref Range   WBC 9.1 4.0 - 10.5 K/uL   RBC 3.99 3.87 - 5.11 MIL/uL   Hemoglobin 11.5 (L) 12.0 - 15.0 g/dL   HCT 40.0 36.0 - 46.0 %   MCV 100.3 (H) 80.0 - 100.0 fL   MCH 28.8 26.0 - 34.0 pg   MCHC 28.8 (L) 30.0 - 36.0 g/dL   RDW 18.5 (H) 11.5 - 15.5 %   Platelets 168 150 - 400 K/uL   nRBC 0.0 0.0 - 0.2 %   Neutrophils Relative % 88 %   Neutro Abs 8.1 (H) 1.7 - 7.7 K/uL   Lymphocytes Relative 7 %   Lymphs Abs 0.6 (L) 0.7 - 4.0 K/uL   Monocytes Relative 3 %   Monocytes Absolute 0.3 0.1 - 1.0 K/uL   Eosinophils Relative 1 %   Eosinophils Absolute 0.1 0.0 - 0.5 K/uL   Basophils Relative 0 %   Basophils Absolute 0.0 0.0 - 0.1 K/uL   Immature Granulocytes 1 %  Abs Immature Granulocytes 0.05 0.00 - 0.07 K/uL    Comment: Performed at [facility], [place] [address], [place], [place] [address]  Comprehensive metabolic panel     Status: Abnormal   Collection Time: 12/21/18  7:59 PM  Result Value Ref Range   Sodium 136 135 - 145 mmol/L   Potassium 4.0 3.5 - 5.1 mmol/L   Chloride 104 98 - 111 mmol/L   CO2 24 22 - 32 mmol/L   Glucose, Bld 166 (H) 70 - 99 mg/dL   BUN 31 (H) 8 - 23 mg/dL   Creatinine, Ser 1.18 (H) 0.44 - 1.00 mg/dL   Calcium 9.1 8.9 - 10.3 mg/dL   Total Protein 7.3 6.5 - 8.1 g/dL   Albumin 3.4 (L) 3.5 - 5.0 g/dL   AST 22 15 - 41 U/L   ALT 15 0 - 44 U/L   Alkaline Phosphatase 67 38 - 126 U/L   Total Bilirubin 1.1 0.3 - 1.2 mg/dL   GFR calc non Af Amer 50 (L) >60 mL/min   GFR calc Af Amer 58 (L) >60 mL/min   Anion gap 8 5 - 15    Comment: Performed at [facility], [place]  [address], [place], [place] [address]  Surgical pcr screen     Status: None   Collection Time: 12/22/18  1:29 AM  Result Value Ref Range   MRSA, PCR NEGATIVE NEGATIVE   Staphylococcus aureus NEGATIVE NEGATIVE    Comment: (NOTE) The Xpert SA Assay (FDA approved for NASAL specimens in patients 17 years of age and older), is one component of a comprehensive surveillance program. It is not intended to diagnose infection nor to guide or monitor treatment. Performed at [facility], [place] [address], [place], [place] [address]   CBC     Status: Abnormal   Collection Time: 12/22/18  2:09 AM  Result Value Ref Range   WBC 14.5 (H) 4.0 - 10.5 K/uL   RBC 4.17 3.87 - 5.11 MIL/uL   Hemoglobin 11.8 (L) 12.0 - 15.0 g/dL   HCT 40.2 36.0 - 46.0 %   MCV 96.4 80.0 - 100.0 fL   MCH 28.3 26.0 - 34.0 pg   MCHC 29.4 (L) 30.0 - 36.0 g/dL   RDW 18.4 (H) 11.5 - 15.5 %   Platelets 191 150 - 400 K/uL   nRBC 0.0 0.0 - 0.2 %    Comment: Performed at [facility], [place] [address], [place], [place] [address]  Basic metabolic panel     Status: Abnormal   Collection Time: 12/22/18  2:09 AM  Result Value Ref Range   Sodium 137 135 - 145 mmol/L   Potassium 4.4 3.5 - 5.1 mmol/L   Chloride 103 98 - 111 mmol/L   CO2 24 22 - 32 mmol/L   Glucose, Bld 209 (H) 70 - 99 mg/dL   BUN 26 (H) 8 - 23 mg/dL   Creatinine, Ser 1.27 (H) 0.44 - 1.00 mg/dL   Calcium 9.4 8.9 - 10.3 mg/dL   GFR calc non Af Amer 46 (L) >60 mL/min   GFR calc Af Amer 53 (L) >60 mL/min   Anion gap 10 5 - 15    Comment: Performed at [facility] [address] [place], [place] [address]  Type and screen     Status: None   Collection Time: 12/22/18  2:10 AM  Result Value Ref Range   ABO/RH(D) A NEG    Antibody  Screen      NEG Performed at [facility], [place] [address] [place], [place] [address]    Sample Expiration 12/25/2018   ABO/Rh     Status: None   Collection Time: 12/22/18  2:10 AM  Result Value Ref Range    ABO/RH(D)      A NEG Performed at [facility] [address], [place], [place] [address]     Dg Wrist Complete Right  Result Date: 12/21/2018 CLINICAL DATA:  Trip and fall RIGHT wrist pain. EXAM: RIGHT WRIST - COMPLETE 3+ VIEW COMPARISON:  RIGHT wrist radiograph October 13, 2014. FINDINGS: There is no evidence of fracture or dislocation. There is no evidence of arthropathy or other focal bone abnormality. Osteopenia. Intravenous catheter in place. Soft tissues are unremarkable. IMPRESSION: 1. No acute fracture deformity or dislocation. 2. Osteopenia decreases sensitivity for acute nondisplaced fractures. Electronically Signed   By: Elon Alas M.D.   On: 12/21/2018 19:58   Dg Ankle 2 Views Left  Result Date: 12/21/2018 CLINICAL DATA:  Fall EXAM: LEFT ANKLE - 2 VIEW COMPARISON:  None. FINDINGS: There is no evidence of fracture, dislocation, or joint effusion. There is no evidence of arthropathy or other focal bone abnormality. Soft tissues are unremarkable. IMPRESSION: Negative. Electronically Signed   By: Ulyses Jarred M.D.   On: 12/21/2018 19:56   Dg Ankle 2 Views Right  Result Date: 12/21/2018 CLINICAL DATA:  Fall EXAM: RIGHT ANKLE - 2 VIEW COMPARISON:  None. FINDINGS: Nondisplaced fracture of the distal right fibula with mild soft tissue swelling. Ankle mortise remains approximated. No distal tibia fracture. IMPRESSION: Nondisplaced fracture of the distal right fibula with overlying soft tissue swelling. Electronically Signed   By: Ulyses Jarred M.D.   On: 12/21/2018 19:57   Ct Knee Left Wo Contrast  Result Date: 12/21/2018 CLINICAL DATA:  Post fall with bilateral knee pain. Bilateral knee fractures. EXAM: CT OF THE LEFT KNEE WITHOUT CONTRAST TECHNIQUE: Multidetector CT imaging of the LEFT knee was performed according to the standard protocol. Multiplanar CT image reconstructions were also generated. COMPARISON:  Radiographs earlier this day. FINDINGS: Bones/Joint/Cartilage  Comminuted displaced fracture of the proximal tibial metaphysis. Displacement anteriorly of approximately 9 mm. Fracture approaches but does not extend to the articular surface laterally. There is fracture involvement of the tibial attachment of the patellar tendon. Comminuted displaced proximal fibular diaphyseal fracture. No involvement of the fibular head. Patella and distal femur are intact. There is no knee joint effusion. Ligaments Suboptimally assessed by CT. ACL and PCL fibers are visualized. Muscles and Tendons Hematoma superficial to the patellar tendon, with fracture at the tibial insertion. Quadriceps tendon is intact. No intramuscular collection. Soft tissues Soft tissue hematoma anteriorly extending from the patellar tendon to the proximal tibial diaphysis. IMPRESSION: 1. Comminuted displaced fracture of the proximal tibial metaphysis. Displacement anteriorly of approximately 9 mm, involving the patellar tendon insertion. Fracture approaches but does not extend to the articular surface laterally. 2. Comminuted displaced proximal fibular diaphyseal fracture. Electronically Signed   By: Keith Rake M.D.   On: 12/21/2018 22:56   Ct Knee Right Wo Contrast  Result Date: 12/21/2018 CLINICAL DATA:  Trip and fall with bilateral knee pain. Bilateral knee fractures. EXAM: CT OF THE RIGHT KNEE WITHOUT CONTRAST TECHNIQUE: Multidetector CT imaging of the RIGHT knee was performed according to the standard protocol. Multiplanar CT image reconstructions were also generated. COMPARISON:  Radiographs earlier this day. FINDINGS: Bones/Joint/Cartilage Comminuted tibial plateau fracture with dominant fracture in  the lateral tibial plateau which is decompressed, articular depression of approximately 7 mm. Fracture extends through the tibial spines with suspected nondisplaced posteromedial compartment involvement. No metaphyseal fracture plane. Proximal fibula, patella, and distal femur are intact without additional  acute fracture. Moderately point hemarthrosis. Ligaments Suboptimally assessed by CT. PCL fibers are visualized, attenuated ACL fibers tentatively visualized. Muscles and Tendons Quadriceps and patellar tendons are intact. No evidence of intramuscular hematoma. Soft tissues Mild subcutaneous soft tissue edema. IMPRESSION: Comminuted lateral tibial plateau fracture with articular depression of approximately 7 mm. Fracture extends through the tibial spines with suspected nondisplaced posteromedial compartment involvement. Electronically Signed   By: Keith Rake M.D.   On: 12/21/2018 22:51   Dg Chest Port 1 View  Result Date: 12/21/2018 CLINICAL DATA:  Stated history of postop. Patient reports preop knee surgery. EXAM: PORTABLE CHEST 1 VIEW COMPARISON:  Radiographs 10/13/2018 FINDINGS: The cardiomediastinal contours are unchanged. Streaky left lung base scarring, unchanged from prior. Pulmonary vasculature is normal. No consolidation, pleural effusion, or pneumothorax. No acute osseous abnormalities are seen. IMPRESSION: Left lung base scarring.  No acute chest findings. Electronically Signed   By: Keith Rake M.D.   On: 12/21/2018 20:21   Dg Knee Complete 4 Views Left  Result Date: 12/21/2018 CLINICAL DATA:  Fall with knee pain EXAM: LEFT KNEE - COMPLETE 4+ VIEW COMPARISON:  None. FINDINGS: Oblique fracture of the proximal left tibia with mild medial displacement. No intra-articular extension. There is also a comminuted fracture of the proximal shaft of the left fibula. Minimal displacement. The knee remains approximated. IMPRESSION: Extra-articular fractures of the proximal tibia and fibula, with mild medial displacement of the tibia fracture. Electronically Signed   By: Ulyses Jarred M.D.   On: 12/21/2018 19:54   Dg Knee Complete 4 Views Right  Result Date: 12/21/2018 CLINICAL DATA:  Trip and fall onto knee, deformity. EXAM: RIGHT KNEE - COMPLETE 4+ VIEW COMPARISON:  None. FINDINGS: Acute  mildly depressed lateral tibial plateau fracture. Linear lucency through medial tibial plateau. No dislocation. No destructive bony lesions. Osteopenia. Lipohemarthrosis suspected. IMPRESSION: 1. Acute depressed lateral tibial plateau fracture. Potential nondisplaced medial plateau component. No dislocation. 2. Osteopenia decreases sensitivity for acute nondisplaced fractures. Electronically Signed   By: Elon Alas M.D.   On: 12/21/2018 19:57    Review of Systems  Constitutional: Negative for weight loss.  HENT: Negative for ear discharge, ear pain, hearing loss and tinnitus.   Eyes: Negative for blurred vision, double vision, photophobia and pain.  Respiratory: Negative for cough, sputum production and shortness of breath.   Cardiovascular: Negative for chest pain.  Gastrointestinal: Negative for abdominal pain, nausea and vomiting.  Genitourinary: Negative for dysuria, flank pain, frequency and urgency.  Musculoskeletal: Positive for joint pain (Bilateral knees). Negative for back pain, falls, myalgias and neck pain.  Neurological: Negative for dizziness, tingling, sensory change, focal weakness, loss of consciousness and headaches.  Endo/Heme/Allergies: Does not bruise/bleed easily.  Psychiatric/Behavioral: Negative for depression, memory loss and substance abuse. The patient is not nervous/anxious.    Blood pressure (!) 153/81, pulse 95, temperature 97.8 F (36.6 C), temperature source Oral, resp. rate 16, height 5' 1.2" (1.554 m), weight 82.7 kg, SpO2 99 %. Physical Exam  Constitutional: She appears well-developed and well-nourished. No distress.  HENT:  Head: Normocephalic and atraumatic.  Eyes: Conjunctivae are normal. Right eye exhibits no discharge. Left eye exhibits no discharge. No scleral icterus.  Neck: Normal range of motion.  Cardiovascular: Normal rate and regular rhythm.  Respiratory:  Effort normal. No respiratory distress.  Musculoskeletal:     Comments: RLE No  traumatic wounds, ecchymosis, or rash  KI in place  No ankle effusion  Sens DPN, SPN, TN intact  Motor EHL, ext, flex, evers 5/5  DP 2+, PT 0, No significant edema  LLE No traumatic wounds, ecchymosis, or rash  Knee bent at 45 degree angle  No knee or ankle effusion  Sens DPN, SPN, TN intact  Motor EHL, ext, flex, evers 5/5  DP 2+, PT 0, No significant edema  Neurological: She is alert.  Skin: Skin is warm and dry. She is not diaphoretic.  Psychiatric: She has a normal mood and affect. Her behavior is normal.    Assessment/Plan: Fall Right tibia plateau fx -- Plan for ORIF tomorrow by Dr. Doreatha Martin. NPO after MN. Will be NWB following surgery. Left tib/fib fxs -- Plan for IMN tomorrow. Will be WBAT following surgery. Multiple medical problems including PCKD s/p renal transplant, asthma, and CHF -- per primary service    Lisette Abu, PA-C Orthopedic Surgery [phone] 12/22/2018, 9:42 AM

## 2018-12-22 NOTE — Progress Notes (Signed)
Patient arrived from [facility] via [place]. Patient is alert and oriented. Patient transferred from stretcher to bed. Complained of pain when transferring.  Patient is oriented to the room. Call bell is within reach. Pain med administered. Will continue to monitor.

## 2018-12-23 ENCOUNTER — Inpatient Hospital Stay (HOSPITAL_COMMUNITY): Payer: Medicare Other | Admitting: Anesthesiology

## 2018-12-23 ENCOUNTER — Inpatient Hospital Stay (HOSPITAL_COMMUNITY): Payer: Medicare Other

## 2018-12-23 ENCOUNTER — Encounter (HOSPITAL_COMMUNITY): Payer: Self-pay | Admitting: Certified Registered Nurse Anesthetist

## 2018-12-23 ENCOUNTER — Encounter (HOSPITAL_COMMUNITY)
Admission: EM | Disposition: A | Discharge status: Skilled Nursing Facility | Payer: Self-pay | Source: Home / Self Care | Attending: Family Medicine

## 2018-12-23 HISTORY — PX: ORIF TIBIA PLATEAU: SHX2132

## 2018-12-23 HISTORY — PX: ORIF TIBIA FRACTURE: SHX5416

## 2018-12-23 LAB — COMPREHENSIVE METABOLIC PANEL
ALT: 12 U/L (ref 0–44)
AST: 17 U/L (ref 15–41)
Albumin: 2.8 g/dL — ABNORMAL LOW (ref 3.5–5.0)
Alkaline Phosphatase: 66 U/L (ref 38–126)
Anion gap: 11 (ref 5–15)
BUN: 27 mg/dL — AB (ref 8–23)
CO2: 22 mmol/L (ref 22–32)
Calcium: 9.1 mg/dL (ref 8.9–10.3)
Chloride: 100 mmol/L (ref 98–111)
Creatinine, Ser: 1.27 mg/dL — ABNORMAL HIGH (ref 0.44–1.00)
GFR calc Af Amer: 53 mL/min — ABNORMAL LOW (ref >60)
GFR calc non Af Amer: 46 mL/min — ABNORMAL LOW (ref >60)
Glucose, Bld: 141 mg/dL — ABNORMAL HIGH (ref 70–99)
Potassium: 4.9 mmol/L (ref 3.5–5.1)
SODIUM: 133 mmol/L — AB (ref 135–145)
Total Bilirubin: 2.6 mg/dL — ABNORMAL HIGH (ref 0.3–1.2)
Total Protein: 7.3 g/dL (ref 6.5–8.1)

## 2018-12-23 LAB — CBC
HCT: 36.2 % (ref 36.0–46.0)
Hemoglobin: 11 g/dL — ABNORMAL LOW (ref 12.0–15.0)
MCH: 29 pg (ref 26.0–34.0)
MCHC: 30.4 g/dL (ref 30.0–36.0)
MCV: 95.5 fL (ref 80.0–100.0)
NRBC: 0 % (ref 0.0–0.2)
Platelets: 181 10*3/uL (ref 150–400)
RBC: 3.79 MIL/uL — AB (ref 3.87–5.11)
RDW: 18.6 % — ABNORMAL HIGH (ref 11.5–15.5)
WBC: 20.2 10*3/uL — ABNORMAL HIGH (ref 4.0–10.5)

## 2018-12-23 LAB — VITAMIN D 25 HYDROXY (VIT D DEFICIENCY, FRACTURES): Vit D, 25-Hydroxy: 24.4 ng/mL — ABNORMAL LOW (ref 30.0–100.0)

## 2018-12-23 LAB — MAGNESIUM: Magnesium: 1.9 mg/dL (ref 1.7–2.4)

## 2018-12-23 SURGERY — OPEN REDUCTION INTERNAL FIXATION (ORIF) TIBIAL PLATEAU
Anesthesia: General | Laterality: Right

## 2018-12-23 MED ORDER — LACTATED RINGERS IV SOLN
INTRAVENOUS | Status: DC | PRN
  Administered 2018-12-23 (×2): via INTRAVENOUS

## 2018-12-23 MED ORDER — ROCURONIUM BROMIDE 50 MG/5ML IV SOSY
PREFILLED_SYRINGE | INTRAVENOUS | Status: AC
  Filled 2018-12-23: qty 5

## 2018-12-23 MED ORDER — MORPHINE SULFATE (PF) 2 MG/ML IV SOLN
2.0000 mg | INTRAVENOUS | Status: DC | PRN
  Administered 2018-12-23 – 2018-12-27 (×3): 2 mg via INTRAVENOUS
  Filled 2018-12-23 (×3): qty 1

## 2018-12-23 MED ORDER — ROCURONIUM BROMIDE 10 MG/ML (PF) SYRINGE
PREFILLED_SYRINGE | INTRAVENOUS | Status: DC | PRN
  Administered 2018-12-23: 10 mg via INTRAVENOUS
  Administered 2018-12-23: 50 mg via INTRAVENOUS

## 2018-12-23 MED ORDER — SUGAMMADEX SODIUM 200 MG/2ML IV SOLN
INTRAVENOUS | Status: DC | PRN
  Administered 2018-12-23: 200 mg via INTRAVENOUS

## 2018-12-23 MED ORDER — ONDANSETRON HCL 4 MG/2ML IJ SOLN
INTRAMUSCULAR | Status: DC | PRN
  Administered 2018-12-23: 4 mg via INTRAVENOUS

## 2018-12-23 MED ORDER — PHENYLEPHRINE 40 MCG/ML (10ML) SYRINGE FOR IV PUSH (FOR BLOOD PRESSURE SUPPORT)
PREFILLED_SYRINGE | INTRAVENOUS | Status: AC
  Filled 2018-12-23: qty 20

## 2018-12-23 MED ORDER — ACETAMINOPHEN 10 MG/ML IV SOLN: 1000.0000 mg | Freq: Once | INTRAVENOUS | Status: DC | PRN

## 2018-12-23 MED ORDER — HYDROMORPHONE HCL 1 MG/ML IJ SOLN
0.2500 mg | INTRAMUSCULAR | Status: DC | PRN
  Administered 2018-12-23: 0.5 mg via INTRAVENOUS
  Administered 2018-12-23: 1 mg via INTRAVENOUS
  Administered 2018-12-23: 0.5 mg via INTRAVENOUS

## 2018-12-23 MED ORDER — PROPOFOL 10 MG/ML IV BOLUS
INTRAVENOUS | Status: DC | PRN
  Administered 2018-12-23: 160 mg via INTRAVENOUS

## 2018-12-23 MED ORDER — HYDROCODONE-ACETAMINOPHEN 5-325 MG PO TABS
ORAL_TABLET | ORAL | Status: AC
  Filled 2018-12-23: qty 2

## 2018-12-23 MED ORDER — LACTATED RINGERS IV SOLN: INTRAVENOUS | Status: DC

## 2018-12-23 MED ORDER — 0.9 % SODIUM CHLORIDE (POUR BTL) OPTIME
TOPICAL | Status: DC | PRN
  Administered 2018-12-23: 1000 mL

## 2018-12-23 MED ORDER — SCOPOLAMINE 1 MG/3DAYS TD PT72
MEDICATED_PATCH | TRANSDERMAL | Status: DC | PRN
  Administered 2018-12-23: 1 via TRANSDERMAL

## 2018-12-23 MED ORDER — LIDOCAINE 2% (20 MG/ML) 5 ML SYRINGE
INTRAMUSCULAR | Status: DC | PRN
  Administered 2018-12-23: 100 mg via INTRAVENOUS

## 2018-12-23 MED ORDER — CEFAZOLIN SODIUM-DEXTROSE 2-4 GM/100ML-% IV SOLN
2.0000 g | Freq: Three times a day (TID) | INTRAVENOUS | Status: AC
  Administered 2018-12-23 – 2018-12-24 (×3): 2 g via INTRAVENOUS
  Filled 2018-12-23 (×3): qty 100

## 2018-12-23 MED ORDER — SODIUM CHLORIDE 0.9 % IV SOLN
INTRAVENOUS | Status: DC | PRN
  Administered 2018-12-23: 80 ug/min via INTRAVENOUS

## 2018-12-23 MED ORDER — FENTANYL CITRATE (PF) 100 MCG/2ML IJ SOLN
INTRAMUSCULAR | Status: DC | PRN
  Administered 2018-12-23: 100 ug via INTRAVENOUS

## 2018-12-23 MED ORDER — FENTANYL CITRATE (PF) 250 MCG/5ML IJ SOLN
INTRAMUSCULAR | Status: AC
  Filled 2018-12-23: qty 5

## 2018-12-23 MED ORDER — SCOPOLAMINE 1 MG/3DAYS TD PT72
MEDICATED_PATCH | TRANSDERMAL | Status: AC
  Filled 2018-12-23: qty 1

## 2018-12-23 MED ORDER — PROMETHAZINE HCL 25 MG/ML IJ SOLN: 6.2500 mg | INTRAMUSCULAR | Status: DC | PRN

## 2018-12-23 MED ORDER — HYDROMORPHONE HCL 1 MG/ML IJ SOLN
INTRAMUSCULAR | Status: AC
  Administered 2018-12-23: 0.5 mg via INTRAVENOUS
  Filled 2018-12-23: qty 1

## 2018-12-23 MED ORDER — ONDANSETRON HCL 4 MG/2ML IJ SOLN
INTRAMUSCULAR | Status: AC
  Filled 2018-12-23: qty 2

## 2018-12-23 MED ORDER — VANCOMYCIN HCL 1000 MG IV SOLR
INTRAVENOUS | Status: AC
  Filled 2018-12-23: qty 2000

## 2018-12-23 MED ORDER — HYDROMORPHONE HCL 1 MG/ML IJ SOLN
INTRAMUSCULAR | Status: AC
  Filled 2018-12-23: qty 1

## 2018-12-23 MED ORDER — ENOXAPARIN SODIUM 40 MG/0.4ML ~~LOC~~ SOLN
40.0000 mg | SUBCUTANEOUS | Status: DC
  Administered 2018-12-24 – 2018-12-29 (×6): 40 mg via SUBCUTANEOUS
  Filled 2018-12-23 (×6): qty 0.4

## 2018-12-23 MED ORDER — VANCOMYCIN HCL 1000 MG IV SOLR
INTRAVENOUS | Status: DC | PRN
  Administered 2018-12-23 (×2): 1000 mg via TOPICAL

## 2018-12-23 MED ORDER — DEXAMETHASONE SODIUM PHOSPHATE 10 MG/ML IJ SOLN
INTRAMUSCULAR | Status: DC | PRN
  Administered 2018-12-23: 4 mg via INTRAVENOUS

## 2018-12-23 MED ORDER — MIDAZOLAM HCL 2 MG/2ML IJ SOLN
INTRAMUSCULAR | Status: AC
  Filled 2018-12-23: qty 2

## 2018-12-23 MED ORDER — SUCCINYLCHOLINE CHLORIDE 200 MG/10ML IV SOSY
PREFILLED_SYRINGE | INTRAVENOUS | Status: DC | PRN
  Administered 2018-12-23: 120 mg via INTRAVENOUS

## 2018-12-23 MED ORDER — SUCCINYLCHOLINE CHLORIDE 200 MG/10ML IV SOSY
PREFILLED_SYRINGE | INTRAVENOUS | Status: AC
  Filled 2018-12-23: qty 10

## 2018-12-23 MED ORDER — PHENYLEPHRINE 40 MCG/ML (10ML) SYRINGE FOR IV PUSH (FOR BLOOD PRESSURE SUPPORT)
PREFILLED_SYRINGE | INTRAVENOUS | Status: DC | PRN
  Administered 2018-12-23: 240 ug via INTRAVENOUS
  Administered 2018-12-23: 280 ug via INTRAVENOUS

## 2018-12-23 MED ORDER — PROPOFOL 10 MG/ML IV BOLUS
INTRAVENOUS | Status: AC
  Filled 2018-12-23: qty 20

## 2018-12-23 MED ORDER — BACITRACIN ZINC 500 UNIT/GM EX OINT
TOPICAL_OINTMENT | CUTANEOUS | Status: AC
  Filled 2018-12-23: qty 28.35

## 2018-12-23 MED ORDER — DEXAMETHASONE SODIUM PHOSPHATE 10 MG/ML IJ SOLN
INTRAMUSCULAR | Status: AC
  Filled 2018-12-23: qty 1

## 2018-12-23 MED FILL — Ondansetron HCl Inj 4 MG/2ML (2 MG/ML): INTRAMUSCULAR | Qty: 2 | Status: AC

## 2018-12-23 SURGICAL SUPPLY — 109 items
BANDAGE ACE 4X5 VEL STRL LF (GAUZE/BANDAGES/DRESSINGS) ×6 IMPLANT
BANDAGE ACE 6X5 VEL STRL LF (GAUZE/BANDAGES/DRESSINGS) ×6 IMPLANT
BANDAGE ESMARK 6X9 LF (GAUZE/BANDAGES/DRESSINGS) ×2 IMPLANT
BIT DRILL 2.5 X LONG (BIT) ×2
BIT DRILL 3.3 LONG (BIT) ×2 IMPLANT
BIT DRILL CALIBR QC 2.8X250 (BIT) ×2 IMPLANT
BIT DRILL PERC QC 2.8X200 100 (BIT) IMPLANT
BIT DRILL QC 3.3X195 (BIT) ×2 IMPLANT
BIT DRILL X LONG 2.5 (BIT) IMPLANT
BLADE CLIPPER SURG (BLADE) IMPLANT
BLADE SURG 15 STRL LF DISP TIS (BLADE) ×2 IMPLANT
BLADE SURG 15 STRL SS (BLADE) ×2
BNDG COHESIVE 4X5 TAN STRL (GAUZE/BANDAGES/DRESSINGS) IMPLANT
BNDG ESMARK 6X9 LF (GAUZE/BANDAGES/DRESSINGS) ×4
BNDG GAUZE ELAST 4 BULKY (GAUZE/BANDAGES/DRESSINGS) ×4 IMPLANT
BONE CANC CHIPS 20CC PCAN1/4 (Bone Implant) ×4 IMPLANT
BRUSH SCRUB SURG 4.25 DISP (MISCELLANEOUS) ×8 IMPLANT
CANISTER SUCT 3000ML PPV (MISCELLANEOUS) ×4 IMPLANT
CAP LOCK NCB (Cap) ×8 IMPLANT
CHIPS CANC BONE 20CC PCAN1/4 (Bone Implant) ×2 IMPLANT
CHLORAPREP W/TINT 26ML (MISCELLANEOUS) ×8 IMPLANT
COVER MAYO STAND STRL (DRAPES) ×2 IMPLANT
COVER SURGICAL LIGHT HANDLE (MISCELLANEOUS) ×4 IMPLANT
COVER WAND RF STERILE (DRAPES) ×4 IMPLANT
CUFF TOURNIQUET SINGLE 34IN LL (TOURNIQUET CUFF) ×6 IMPLANT
DRAPE C-ARM 42X72 X-RAY (DRAPES) ×4 IMPLANT
DRAPE C-ARMOR (DRAPES) ×4 IMPLANT
DRAPE EXTREMITY BILATERAL (DRAPES) ×2 IMPLANT
DRAPE HALF SHEET 40X57 (DRAPES) ×8 IMPLANT
DRAPE INCISE IOBAN 66X45 STRL (DRAPES) ×4 IMPLANT
DRAPE ORTHO SPLIT 77X108 STRL (DRAPES) ×4
DRAPE SURG ORHT 6 SPLT 77X108 (DRAPES) ×4 IMPLANT
DRAPE U-SHAPE 47X51 STRL (DRAPES) ×4 IMPLANT
DRILL BIT QUICK COUP 2.8MM 100 (BIT) ×2
DRILL BIT X LONG 2.5 (BIT) ×2
DRSG ADAPTIC 3X8 NADH LF (GAUZE/BANDAGES/DRESSINGS) ×6 IMPLANT
DRSG PAD ABDOMINAL 8X10 ST (GAUZE/BANDAGES/DRESSINGS) ×12 IMPLANT
ELECT REM PT RETURN 9FT ADLT (ELECTROSURGICAL) ×4
ELECTRODE REM PT RTRN 9FT ADLT (ELECTROSURGICAL) ×2 IMPLANT
GAUZE SPONGE 4X4 12PLY STRL (GAUZE/BANDAGES/DRESSINGS) ×4 IMPLANT
GAUZE SPONGE 4X4 12PLY STRL LF (GAUZE/BANDAGES/DRESSINGS) ×4 IMPLANT
GLOVE BIO SURGEON STRL SZ 6.5 (GLOVE) ×9 IMPLANT
GLOVE BIO SURGEON STRL SZ7.5 (GLOVE) ×16 IMPLANT
GLOVE BIO SURGEONS STRL SZ 6.5 (GLOVE) ×3
GLOVE BIOGEL PI IND STRL 6.5 (GLOVE) ×2 IMPLANT
GLOVE BIOGEL PI IND STRL 7.5 (GLOVE) ×2 IMPLANT
GLOVE BIOGEL PI INDICATOR 6.5 (GLOVE) ×2
GLOVE BIOGEL PI INDICATOR 7.5 (GLOVE) ×2
GLOVE PROGUARD SZ 7 1/2 (GLOVE) ×4 IMPLANT
GOWN STRL REUS W/ TWL LRG LVL3 (GOWN DISPOSABLE) ×4 IMPLANT
GOWN STRL REUS W/TWL LRG LVL3 (GOWN DISPOSABLE) ×4
GRAFT BNE CANC CHIPS 1-8 20CC (Bone Implant) IMPLANT
IMMOBILIZER KNEE 22 UNIV (SOFTGOODS) ×2 IMPLANT
K-WIRE 1.6X150 (WIRE) ×4
K-WIRE 2.0 (WIRE) ×4
K-WIRE FXSTD 280X2XNS SS (WIRE) ×4
KIT BASIN OR (CUSTOM PROCEDURE TRAY) ×4 IMPLANT
KIT TURNOVER KIT B (KITS) ×4 IMPLANT
KWIRE 1.6X150 (WIRE) IMPLANT
KWIRE FXSTD 280X2XNS SS (WIRE) IMPLANT
MANIFOLD NEPTUNE II (INSTRUMENTS) ×4 IMPLANT
NDL SUT 6 .5 CRC .975X.05 MAYO (NEEDLE) ×2 IMPLANT
NEEDLE MAYO TAPER (NEEDLE) ×2
NS IRRIG 1000ML POUR BTL (IV SOLUTION) ×4 IMPLANT
PACK TOTAL JOINT (CUSTOM PROCEDURE TRAY) ×4 IMPLANT
PAD ARMBOARD 7.5X6 YLW CONV (MISCELLANEOUS) ×8 IMPLANT
PAD CAST 4YDX4 CTTN HI CHSV (CAST SUPPLIES) ×2 IMPLANT
PADDING CAST COTTON 4X4 STRL (CAST SUPPLIES) ×4
PADDING CAST COTTON 6X4 STRL (CAST SUPPLIES) ×6 IMPLANT
PLATE NCB LAT PROX 3H TIB 7H (Plate) ×2 IMPLANT
PLATE PROX TIBIA RT 4H (Plate) ×2 IMPLANT
SCREW HEADED ST 3.5X34 (Screw) ×2 IMPLANT
SCREW HEADED ST 3.5X38 (Screw) ×2 IMPLANT
SCREW HEADED ST 3.5X80 (Screw) ×2 IMPLANT
SCREW LOCKING 3.5X70MM VA (Screw) ×2 IMPLANT
SCREW LOCKING VA 3.5X75MM (Screw) ×6 IMPLANT
SCREW NCB 4.0 28MM (Screw) ×2 IMPLANT
SCREW NCB 4.0MX30M (Screw) ×2 IMPLANT
SCREW NCB 4.0X40MM (Screw) ×2 IMPLANT
SCREW NCB 4.0X75 CORT S/T (Screw) ×4 IMPLANT
SCREW PROX ST NCB 4X80 (Screw) ×4 IMPLANT
SPONGE LAP 18X18 X RAY DECT (DISPOSABLE) ×2 IMPLANT
STAPLER VISISTAT 35W (STAPLE) ×4 IMPLANT
STOCKINETTE IMPERVIOUS LG (DRAPES) IMPLANT
SUCTION FRAZIER HANDLE 10FR (MISCELLANEOUS) ×2
SUCTION TUBE FRAZIER 10FR DISP (MISCELLANEOUS) ×2 IMPLANT
SUT ETHILON 2 0 FS 18 (SUTURE) ×6 IMPLANT
SUT ETHILON 3 0 PS 1 (SUTURE) IMPLANT
SUT FIBERWIRE #2 38 T-5 BLUE (SUTURE)
SUT MNCRL AB 3-0 PS2 18 (SUTURE) ×2 IMPLANT
SUT PROLENE 0 CT (SUTURE) IMPLANT
SUT VIC AB 0 CT1 18XCR BRD 8 (SUTURE) IMPLANT
SUT VIC AB 0 CT1 27 (SUTURE) ×2
SUT VIC AB 0 CT1 27XBRD ANBCTR (SUTURE) ×2 IMPLANT
SUT VIC AB 0 CT1 8-18 (SUTURE) ×2
SUT VIC AB 1 CT1 18XCR BRD 8 (SUTURE) IMPLANT
SUT VIC AB 1 CT1 27 (SUTURE) ×4
SUT VIC AB 1 CT1 27XBRD ANBCTR (SUTURE) ×2 IMPLANT
SUT VIC AB 1 CT1 8-18 (SUTURE)
SUT VIC AB 2-0 CT1 27 (SUTURE) ×4
SUT VIC AB 2-0 CT1 TAPERPNT 27 (SUTURE) ×4 IMPLANT
SUTURE FIBERWR #2 38 T-5 BLUE (SUTURE) IMPLANT
TOWEL OR 17X24 6PK STRL BLUE (TOWEL DISPOSABLE) ×4 IMPLANT
TOWEL OR 17X26 10 PK STRL BLUE (TOWEL DISPOSABLE) ×8 IMPLANT
TRAY FOLEY MTR SLVR 16FR STAT (SET/KITS/TRAYS/PACK) IMPLANT
TUBE CONNECTING 12'X1/4 (SUCTIONS) ×1
TUBE CONNECTING 12X1/4 (SUCTIONS) ×3 IMPLANT
WATER STERILE IRR 1000ML POUR (IV SOLUTION) ×4 IMPLANT
YANKAUER SUCT BULB TIP NO VENT (SUCTIONS) ×4 IMPLANT

## 2018-12-23 NOTE — Interval H&P Note (Signed)
History and Physical Interval Note:  12/23/2018 7:18 AM  April [name]  has presented today for surgery, with the diagnosis of Bilateral proximal tibia fractures  The various methods of treatment have been discussed with the patient and family. After consideration of risks, benefits and other options for treatment, the patient has consented to  Procedure(s): OPEN REDUCTION INTERNAL FIXATION (ORIF) TIBIAL PLATEAU (Right) OPEN REDUCTION INTERNAL FIXATION (ORIF) TIBIA FRACTURE (Left) as a surgical intervention .  The patient's history has been reviewed, patient examined, no change in status, stable for surgery.  I have reviewed the patient's chart and labs.  Questions were answered to the patient's satisfaction.     Lennette Bihari P Haddix

## 2018-12-23 NOTE — Op Note (Signed)
Orthopaedic Surgery Operative Note (CSN: [id number] ) Date of Surgery: 12/23/2018  Admit Date: 12/21/2018   Diagnoses: Pre-Op Diagnoses: Right Schatzker II tibial plateau fracture Right lateral malleolus fracture Left proximal tibial shaft fracture S/p renal transplant   Post-Op Diagnosis: Same  Procedures: 1. CPT 27535-Open reduction internal fixation of right lateral tibial plateau fracture 2. CPT 27558-Open reduction internal fixation of left proximal tibial fracture 3. CPT 77071-Stress examination of right ankle  Surgeons : Primary: Shona Needles, MD  Assistant: Patrecia Pace, PA-C  Location:[facility] 3   Anesthesia:General   Antibiotics: Ancef 2g preop   Tourniquet time: * Missing tourniquet times found for documented tourniquets in log: [id number] * Total Tourniquet Time Documented: Thigh (Right) - 56 minutes Total: Thigh (Right) - 56 minutes  Estimated Blood Loss:Minimal  Complications:None  Specimens:None   Implants: Implant Name Type Inv. Item Serial No. Manufacturer Lot No. LRB No. Used Action  BONE CANC CHIPS 20CC - [id number] Bone Implant BONE [facility] CHIPS 20CC [id number] [employer] [place] [employer]  Bilateral 1 Implanted  PLATE PROX TIBIA RT 4H - [id number] Plate PLATE PROX TIBIA RT 4H  [employer]   1 Implanted  SCREW HEADED ST 3.5X80 - [id number] Screw SCREW HEADED ST 3.5X80  [employer]   1 Implanted  SCREW HEADED ST 3.5X34 - [id number] Screw SCREW HEADED ST 3.5X34  [employer]   1 Implanted  SCREW LOCKING VA 3.5X75MM - [id number] Screw SCREW LOCKING VA 3.5X75MM  [employer]   3 Implanted  SCREW LOCKING 3.5X70MM VA - [id number] Screw SCREW LOCKING 3.5X70MM VA  [employer]   1 Implanted  SCREW HEADED ST 3.5X38 - [id number] Screw SCREW HEADED ST 3.5X38  [employer]MA   1 Implanted  7 hole proximal tibial plate    Zimmer   1 Implanted  SCREW NCB 4.0 28MM - [id number] Screw SCREW NCB 4.0 28MM  ZIMMER [employer]ON(ORTH,TRAU,BIO,SG)   1 Implanted  SCREW NCB  4.0MX30M - [id number] Screw SCREW NCB 4.0MX30M  [employer]IMMER [employer](ORTH,TRAU,BIO,SG)   1 Implanted  SCREW NCB 4.0X40MM - [id number] Screw SCREW NCB 4.0X40MM  [employer]IMMER [employer](ORTH,TRAU,BIO,SG)   1 Implanted  SCREW NCB 4.0X75 CORT S/T - [id number] Screw SCREW NCB 4.0X75 CORT S/T  ZIMMER [employer]ON(ORTH,TRAU,BIO,SG)   2 Implanted  SCREW PROX ST NCB 4X80 - [id number] Screw SCREW PROX ST NCB 4X80  ZIMMER [employer]ON(ORTH,TRAU,BIO,SG)   2 Implanted  CAP LOCK NCB - [id number] Cap CAP LOCK NCB  ZIMMER RECON(ORTH,TRAU,BIO,SG)   4 Implanted    Indications for Surgery: 63 year old female who has a history of renal transplant on multiple immunomodulators as well as prednisone that presented after a fall down a few stairs with a left proximal tibia fracture, right tibial plateau fracture and nondisplaced right lateral malleolus fracture.  I felt that due to the amount of displacement and need to restore anatomic alignment that fixation would be most appropriate for her bilateral tibia fractures and with stress the fibula fracture under fluoroscopy.  Risks and benefits were discussed with the patient.Risks discussed included bleeding requiring blood transfusion, bleeding causing a hematoma, infection, malunion, nonunion, damage to surrounding nerves and blood vessels, pain, hardware prominence or irritation, hardware failure, stiffness, post-traumatic arthritis, DVT/PE, compartment syndrome, and even death.  She agreed to proceed with surgery and consent was obtained.  Operative Findings: 1.  Open reduction internal fixation of right Schatzker 2 tibial plateau fracture using Synthes 4-hole proximal tibial locking plate 2.  Stress examination of right lateral malleolus fracture with external rotation stress showed no syndesmotic widening or  medial clear space widening. 3.  Open reduction internal fixation of left proximal tibia fracture using a Zimmer [employer]omet proximal tibia NCB 7 hole plate.  Procedure: The patient was identified in the  preoperative holding area. Consent was confirmed with the patient and their family and all questions were answered. The operative extremity was marked after confirmation with the patient. she was then brought back to the operating room by our anesthesia colleagues.  The patient was placed under general anesthetic and carefully transferred over to a radiolucent flat top table.  Nonsterile tourniquets were placed to her bilateral upper thighs. The extremities were then prepped and draped in usual sterile fashion. A preoperative timeout was performed to verify the patient, the procedure, and the extremity. Preoperative antibiotics were dosed.  Fluoroscopy was used evaluate the injury and were saved. An esmarch was used to exsanguinate the leg and it was inflated to 335mmHg. An anterolateral parapatellar incision was performed and carried through skin and subcutaneous tissue. The overlying fascia was incised in line with the skin incision just lateral to the patellar tendon. It was extended distally into the anterior compartment fascia. Bovie electrocautery was then used to elevated the IT band and musculature off of the anterolateral cortex of the tibia. The release was taken back until the fibular head was palpable. The plane between the IT band and the lateral capsule was developed and the anterior fat pad was resected to expose the capsule.  A submensical arthrotomy was then performed with a 15 blade. Tag sutures were used to retract the capsule and here I was able to visualize the impacted lateral joint and was able to see the meniscus. There was no meniscus tear visualized.  There was a split in the anterolateral cortex that was entered into with a Cobb elevator. The clot was debrided and bone tamp was used to start elevating the articular surface of the lateral tibial plateau. This was done with assistance by fluoroscopy in both the lateral and AP. Once I had elevated the joint sufficiently both visualized  and fluoroscopically, I used crushed cancellous allograft to back filled the void left. I packed the graft underneath the articular surface to provide some structural support. I reduced the lateral plateau split and restore the width of the tibial plateau. I provisionally held this in place with a number of K-wires.  A 3.15mm LCP proximal tibial locking plate was chosen and was aligned to the tibia in both the AP and lateral fluoroscopic views.  A non-locking screw was placed in the proximal segment to bring the plate flush with bone.  A percutaneous incision was made along the tibial shaft to place a nonlocking screw in the shaft to bring the distal portion of the plate flush with bone.  Locking screws were then drilled and placed crossing both the lateral and medial plateau, thereby rafting the impacted lateral joint.  Another nonlocking screw was placed in the tibial shaft.  The tourniquet was dropped and hemostasis was obtained. The incision was thoroughly irrigated. The tag sutures for the capsule were brought through the plate and tied down. One gram of vancomycin powder was placed in the wound and the IT band and anterior tibialis fascia was closed with 0-vicryl suture. The skin was closed with 2-0 vicryl and 3-0 nylon. The percutaneous incisions were closed with 3-0 nylon suture.  I then performed a external rotation stress view of the right ankle.  There is no medial clear space widening and I felt that this could  be treated nonoperatively.  I then turned my attention to the left lower extremity.  Marking our incision using AP and lateral fluoroscopic imaging I made a small incision over Gertie's tubercle and the split the IT band in line with my incision.  I reflected the soft tissue off of the proximal tibia to be able to place a plate and slide this submuscularly along the lateral border of the tibia.  Once the plate was appropriately aligned I held it provisionally with a 1.6 mm K wire.  I made  a percutaneous incision at the distal hole plate and held the distal portion in place with a K wire sleeve.  I then drilled into the proximal metaphysis in the proximal segment and I placed a 4.0 millimeter screw.  I then placed a nonlocking screw in the tibial shaft through the jig.  I was able to reduce the coronal alignment using this technique.  I placed another 2 nonlocking screws in the proximal segment.  Another screw was placed into the shaft.  Total of 3 screws were placed in the shaft and 3 screws were placed in the proximal segment.  Locking caps were placed on all the screws except the most distal 1.  I then removed the jig and placed another oblique screw into the proximal segment to reinforce the fixation and placed a locking cap on this.  Final fluoroscopic imaging was obtained.  The incision was copiously irrigated.  A gram of vancomycin powder was placed into the incision.  The IT band was closed over the plate.  The skin was closed with 2-0 Vicryl and 3-0 nylon.  Both lower extremities were dressed with bacitracin ointment, Adaptic, 4 x 4's and sterile cast padding with Ace wraps.  She was placed back in a knee immobilizer and awoken from anesthesia and taken to PACU in stable condition.  Post Op Plan/Instructions: The patient will be nonweightbearing to the bilateral lower extremities.  She will receive postoperative Ancef.  She will receive Lovenox for DVT prophylaxis.  She may be stand pivot transfer for the left lower extremity but no walker ambulation.  She will be nonweightbearing to the right lower extremity.  She will be placed in a hinged knee brace and allow for early knee range of motion.  I was present and performed the entire surgery.  Patrecia Pace, PA-C did assist me throughout the case. An assistant was necessary given the difficulty in approach, maintenance of reduction and ability to instrument the fracture.   Katha Hamming, MD [facility]

## 2018-12-23 NOTE — Transfer of Care (Signed)
Immediate Anesthesia Transfer of Care Note  Patient: April [name]  Procedure(s) Performed: OPEN REDUCTION INTERNAL FIXATION (ORIF) TIBIAL PLATEAU (Right ) OPEN REDUCTION INTERNAL FIXATION (ORIF) TIBIA FRACTURE (Left )  Patient Location: PACU  Anesthesia Type:General  Level of Consciousness: awake, alert , oriented and patient cooperative  Airway & Oxygen Therapy: Patient Spontanous Breathing and Patient connected to nasal cannula oxygen  Post-op Assessment: Report given to RN, Post -op Vital signs reviewed and stable and Patient moving all extremities X 4  Post vital signs: Reviewed and stable  Last Vitals:  Vitals Value Taken Time  BP 140/61 12/23/2018 10:15 AM  Temp    Pulse 104 12/23/2018 10:21 AM  Resp 21 12/23/2018 10:21 AM  SpO2 95 % 12/23/2018 10:21 AM  Vitals shown include unvalidated device data.  Last Pain:  Vitals:   12/23/18 0531  TempSrc: Oral  PainSc:       Patients Stated Pain Goal: 2 (03/70/48 8891)  Complications: No apparent anesthesia complications

## 2018-12-23 NOTE — Anesthesia Procedure Notes (Signed)
Procedure Name: Intubation Date/Time: 12/23/2018 7:35 AM Performed by: Carney Living, CRNA Pre-anesthesia Checklist: Patient identified, Emergency Drugs available, Suction available, Timeout performed and Patient being monitored Patient Re-evaluated:Patient Re-evaluated prior to induction Oxygen Delivery Method: Circle system utilized Preoxygenation: Pre-oxygenation with 100% oxygen Induction Type: IV induction, Rapid sequence and Cricoid Pressure applied Laryngoscope Size: Mac and 4 Grade View: Grade I Tube type: Oral Tube size: 7.0 mm Number of attempts: 1 Airway Equipment and Method: Stylet Placement Confirmation: ETT inserted through vocal cords under direct vision,  positive ETCO2 and breath sounds checked- equal and bilateral Secured at: 22 cm Tube secured with: Tape Dental Injury: Teeth and Oropharynx as per pre-operative assessment

## 2018-12-23 NOTE — Progress Notes (Signed)
PROGRESS NOTE    April [name]  IOM:[id number] DOB: 05/13/1956 DOA: 12/21/2018 PCP: April Caraway, MD   Brief Narrative:  April [name] is April [name] 63 y.o. female with medical history significant of this post renal transplant (h/o polycystic kidney disease), chronic kidney disease stage III, asthma, chronic diastolic CHF who presented after April [name] mechanical fall found to have bilateral knee fractures.   Assessment & Plan:   Active Problems:   Asthma, mild intermittent   Chronic diastolic CHF (congestive heart failure) (HCC)   Bilateral closed proximal tibial fracture   Closed right ankle fracture   Multiple fractures   Right Lateral Tibial Plateau Fracture Left Comminuted Displaced Fracture of Proximal Tibial Metaphysis Comminuted Displaced Proximal Fibular Diaphyseal Fracture Non displaced Fracture of Distal Right Fibula S/p ORIF R lateral tibial plateau fx, ORIF L proximal tibial fracture, stress examination of R ankle on 1/31 by ortho Plan for nonweightbearing (can stand pivot transfer to LLE, but NWB to RLE) Lovenox for DVT ppx Norco, morphine prn pain control.  Bowel regimen.  Follow vitamin D level (low, will need supplementation)  Hx Renal Transplant  Hx PCKD  CKD Continue prednisone, tacrolimus, and mycophenolate Discussed with ortho, ok with continuing  Asthma  O2 Requirement:  No evidence of exacerbation Continues on 2 L [place] post op, will need to wean as tolerated additional w/u as needed  HFpEF: Per previous provider, pt with grade 1 diastolic dysfunction.  I don't see echo in chart.  In care everywhere, she has unremarkable echo from 2013.    Nausea: possibly related to pain meds above (oxy causes nausea for her).  Prn antiemetics.  Leukocytosis: likely reactive, worse today, she's afebrile  DVT prophylaxis: lovenox Code Status: full  Family Communication: husband, daughter at bedside Disposition Plan: pending further improvement, ortho sign off, PT  eval  Consultants:   orthopedics  Procedures:   none  Antimicrobials:  Anti-infectives (From admission, onward)   Start     Dose/Rate Route Frequency Ordered Stop   12/23/18 1400  ceFAZolin (ANCEF) IVPB 2g/100 mL premix     2 g 200 mL/hr over 30 Minutes Intravenous Every 8 hours 12/23/18 1212 12/24/18 1359   12/23/18 0853  vancomycin (VANCOCIN) powder  Status:  Discontinued       As needed 12/23/18 0854 12/23/18 1008   12/23/18 0615  ceFAZolin (ANCEF) IVPB 2g/100 mL premix     2 g 200 mL/hr over 30 Minutes Intravenous On call to O.R. 12/22/18 1147 12/23/18 0800     Subjective: C/o some nausea and pain Sleepy, drowsy  Objective: Vitals:   12/23/18 1100 12/23/18 1115 12/23/18 1130 12/23/18 1359  BP: 130/63 130/65 119/71 115/60  Pulse: (!) 105 (!) 102 (!) 102 (!) 104  Resp: (!) 24 19 17 18   Temp:    97.7 F (36.5 C)  TempSrc:    Oral  SpO2: 93% 94% 93% (!) 72%  Weight:      Height:        Intake/Output Summary (Last 24 hours) at 12/23/2018 1458 Last data filed at 12/23/2018 1022 Gross per 24 hour  Intake 1860 ml  Output 675 ml  Net 1185 ml   Filed Weights   12/22/18 0139  Weight: 82.7 kg    Examination:  General: No acute distress. Cardiovascular: Heart sounds show April [name] regular rate, and rhythm Lungs: Clear to auscultation bilaterally Abdomen: Soft, nontender, nondistended  Neurological: Drowsy, but appropriately awakens. Moves all extremities 4. Cranial nerves II through XII grossly intact. Skin:  Warm and dry. No rashes or lesions. Extremities: bilateral LE in braces. Psychiatric: Mood and affect are normal. Insight and judgment are appropriate.   Data Reviewed: I have personally reviewed following labs and imaging studies  CBC: Recent Labs  Lab 12/21/18 1959 12/22/18 0209 12/23/18 0244  WBC 9.1 14.5* 20.2*  NEUTROABS 8.1*  --   --   HGB 11.5* 11.8* 11.0*  HCT 40.0 40.2 36.2  MCV 100.3* 96.4 95.5  PLT 168 191 875   Basic Metabolic  Panel: Recent Labs  Lab 12/21/18 1959 12/22/18 0209 12/23/18 0244  NA 136 137 133*  K 4.0 4.4 4.9  CL 104 103 100  CO2 24 24 22   GLUCOSE 166* 209* 141*  BUN 31* 26* 27*  CREATININE 1.18* 1.27* 1.27*  CALCIUM 9.1 9.4 9.1  MG  --   --  1.9   GFR: Estimated Creatinine Clearance: 45.6 mL/min (April [name]) (by C-G formula based on SCr of 1.27 mg/dL (H)). Liver Function Tests: Recent Labs  Lab 12/21/18 1959 12/23/18 0244  AST 22 17  ALT 15 12  ALKPHOS 67 66  BILITOT 1.1 2.6*  PROT 7.3 7.3  ALBUMIN 3.4* 2.8*   No results for input(s): LIPASE, AMYLASE in the last 168 hours. No results for input(s): AMMONIA in the last 168 hours. Coagulation Profile: No results for input(s): INR, PROTIME in the last 168 hours. Cardiac Enzymes: No results for input(s): CKTOTAL, CKMB, CKMBINDEX, TROPONINI in the last 168 hours. BNP (last 3 results) No results for input(s): PROBNP in the last 8760 hours. HbA1C: No results for input(s): HGBA1C in the last 72 hours. CBG: No results for input(s): GLUCAP in the last 168 hours. Lipid Profile: No results for input(s): CHOL, HDL, LDLCALC, TRIG, CHOLHDL, LDLDIRECT in the last 72 hours. Thyroid Function Tests: No results for input(s): TSH, T4TOTAL, FREET4, T3FREE, THYROIDAB in the last 72 hours. Anemia Panel: No results for input(s): VITAMINB12, FOLATE, FERRITIN, TIBC, IRON, RETICCTPCT in the last 72 hours. Sepsis Labs: No results for input(s): PROCALCITON, LATICACIDVEN in the last 168 hours.  Recent Results (from the past 240 hour(s))  Surgical pcr screen     Status: None   Collection Time: 12/22/18  1:29 AM  Result Value Ref Range Status   MRSA, PCR NEGATIVE NEGATIVE Final   Staphylococcus aureus NEGATIVE NEGATIVE Final    Comment: (NOTE) The Xpert SA Assay (FDA approved for NASAL specimens in patients 53 years of age and older), is one component of April [name] comprehensive surveillance program. It is not intended to diagnose infection nor to guide or monitor  treatment. Performed at [facility], [place] [address], [place], [place] [address]          Radiology Studies: Dg Wrist Complete Right  Result Date: 12/21/2018 CLINICAL DATA:  Trip and fall RIGHT wrist pain. EXAM: RIGHT WRIST - COMPLETE 3+ VIEW COMPARISON:  RIGHT wrist radiograph October 13, 2014. FINDINGS: There is no evidence of fracture or dislocation. There is no evidence of arthropathy or other focal bone abnormality. Osteopenia. Intravenous catheter in place. Soft tissues are unremarkable. IMPRESSION: 1. No acute fracture deformity or dislocation. 2. Osteopenia decreases sensitivity for acute nondisplaced fractures. Electronically Signed   By: Elon Alas M.D.   On: 12/21/2018 19:58   Dg Ankle 2 Views Left  Result Date: 12/21/2018 CLINICAL DATA:  Fall EXAM: LEFT ANKLE - 2 VIEW COMPARISON:  None. FINDINGS: There is no evidence of fracture, dislocation, or joint effusion. There is no evidence of arthropathy or other  focal bone abnormality. Soft tissues are unremarkable. IMPRESSION: Negative. Electronically Signed   By: Ulyses Jarred M.D.   On: 12/21/2018 19:56   Dg Ankle 2 Views Right  Result Date: 12/21/2018 CLINICAL DATA:  Fall EXAM: RIGHT ANKLE - 2 VIEW COMPARISON:  None. FINDINGS: Nondisplaced fracture of the distal right fibula with mild soft tissue swelling. Ankle mortise remains approximated. No distal tibia fracture. IMPRESSION: Nondisplaced fracture of the distal right fibula with overlying soft tissue swelling. Electronically Signed   By: Ulyses Jarred M.D.   On: 12/21/2018 19:57   Ct Knee Left Wo Contrast  Result Date: 12/21/2018 CLINICAL DATA:  Post fall with bilateral knee pain. Bilateral knee fractures. EXAM: CT OF THE LEFT KNEE WITHOUT CONTRAST TECHNIQUE: Multidetector CT imaging of the LEFT knee was performed according to the standard protocol. Multiplanar CT image reconstructions were also generated. COMPARISON:  Radiographs earlier this day. FINDINGS:  Bones/Joint/Cartilage Comminuted displaced fracture of the proximal tibial metaphysis. Displacement anteriorly of approximately 9 mm. Fracture approaches but does not extend to the articular surface laterally. There is fracture involvement of the tibial attachment of the patellar tendon. Comminuted displaced proximal fibular diaphyseal fracture. No involvement of the fibular head. Patella and distal femur are intact. There is no knee joint effusion. Ligaments Suboptimally assessed by CT. ACL and PCL fibers are visualized. Muscles and Tendons Hematoma superficial to the patellar tendon, with fracture at the tibial insertion. Quadriceps tendon is intact. No intramuscular collection. Soft tissues Soft tissue hematoma anteriorly extending from the patellar tendon to the proximal tibial diaphysis. IMPRESSION: 1. Comminuted displaced fracture of the proximal tibial metaphysis. Displacement anteriorly of approximately 9 mm, involving the patellar tendon insertion. Fracture approaches but does not extend to the articular surface laterally. 2. Comminuted displaced proximal fibular diaphyseal fracture. Electronically Signed   By: Keith Rake M.D.   On: 12/21/2018 22:56   Ct Knee Right Wo Contrast  Result Date: 12/21/2018 CLINICAL DATA:  Trip and fall with bilateral knee pain. Bilateral knee fractures. EXAM: CT OF THE RIGHT KNEE WITHOUT CONTRAST TECHNIQUE: Multidetector CT imaging of the RIGHT knee was performed according to the standard protocol. Multiplanar CT image reconstructions were also generated. COMPARISON:  Radiographs earlier this day. FINDINGS: Bones/Joint/Cartilage Comminuted tibial plateau fracture with dominant fracture in the lateral tibial plateau which is decompressed, articular depression of approximately 7 mm. Fracture extends through the tibial spines with suspected nondisplaced posteromedial compartment involvement. No metaphyseal fracture plane. Proximal fibula, patella, and distal femur are  intact without additional acute fracture. Moderately point hemarthrosis. Ligaments Suboptimally assessed by CT. PCL fibers are visualized, attenuated ACL fibers tentatively visualized. Muscles and Tendons Quadriceps and patellar tendons are intact. No evidence of intramuscular hematoma. Soft tissues Mild subcutaneous soft tissue edema. IMPRESSION: Comminuted lateral tibial plateau fracture with articular depression of approximately 7 mm. Fracture extends through the tibial spines with suspected nondisplaced posteromedial compartment involvement. Electronically Signed   By: Keith Rake M.D.   On: 12/21/2018 22:51   Dg Chest Port 1 View  Result Date: 12/21/2018 CLINICAL DATA:  Stated history of postop. Patient reports preop knee surgery. EXAM: PORTABLE CHEST 1 VIEW COMPARISON:  Radiographs 10/13/2018 FINDINGS: The cardiomediastinal contours are unchanged. Streaky left lung base scarring, unchanged from prior. Pulmonary vasculature is normal. No consolidation, pleural effusion, or pneumothorax. No acute osseous abnormalities are seen. IMPRESSION: Left lung base scarring.  No acute chest findings. Electronically Signed   By: Keith Rake M.D.   On: 12/21/2018 20:21   Dg Knee Complete  4 Views Left  Result Date: 12/23/2018 CLINICAL DATA:  ORIF. EXAM: DG C-ARM 61-120 MIN; LEFT KNEE - COMPLETE 4+ VIEW COMPARISON:  CT 12/21/2018. FINDINGS: Plate and screw fixation of the proximal tibia noted. Hardware intact. Near anatomic alignment. Proximal fibular fracture noted. IMPRESSION: Plate screw fixation of proximal tibia. Hardware intact. Near anatomic alignment. Electronically Signed   By: Marcello Moores  Register   On: 12/23/2018 11:10   Dg Knee Complete 4 Views Left  Result Date: 12/21/2018 CLINICAL DATA:  Fall with knee pain EXAM: LEFT KNEE - COMPLETE 4+ VIEW COMPARISON:  None. FINDINGS: Oblique fracture of the proximal left tibia with mild medial displacement. No intra-articular extension. There is also Cybill Uriegas  comminuted fracture of the proximal shaft of the left fibula. Minimal displacement. The knee remains approximated. IMPRESSION: Extra-articular fractures of the proximal tibia and fibula, with mild medial displacement of the tibia fracture. Electronically Signed   By: Ulyses Jarred M.D.   On: 12/21/2018 19:54   Dg Knee Complete 4 Views Right  Result Date: 12/23/2018 CLINICAL DATA:  ORIF EXAM: RIGHT KNEE - COMPLETE 4+ VIEW COMPARISON:  12/21/2018. FINDINGS: ORIF proximal right tibia.  Hardware intact.  Anatomic alignment. IMPRESSION: ORIF proximal right tibia.  Anatomic alignment. Electronically Signed   By: Marcello Moores  Register   On: 12/23/2018 11:14   Dg Knee Complete 4 Views Right  Result Date: 12/21/2018 CLINICAL DATA:  Trip and fall onto knee, deformity. EXAM: RIGHT KNEE - COMPLETE 4+ VIEW COMPARISON:  None. FINDINGS: Acute mildly depressed lateral tibial plateau fracture. Linear lucency through medial tibial plateau. No dislocation. No destructive bony lesions. Osteopenia. Lipohemarthrosis suspected. IMPRESSION: 1. Acute depressed lateral tibial plateau fracture. Potential nondisplaced medial plateau component. No dislocation. 2. Osteopenia decreases sensitivity for acute nondisplaced fractures. Electronically Signed   By: Elon Alas M.D.   On: 12/21/2018 19:57   Dg Knee Right Port  Result Date: 12/23/2018 CLINICAL DATA:  S/P bilateral tibial plateau's. EXAM: PORTABLE RIGHT KNEE - 1-2 VIEW COMPARISON:  Radiograph 12/21/2018 FINDINGS: Internal plate fixation tibial plateau fracture with dynamic fixation plate and 5 fixation cortical screws. Anatomic alignment. No complication. IMPRESSION: Internal fixation of tibial plateau fracture. Electronically Signed   By: Suzy Bouchard M.D.   On: 12/23/2018 12:44   Dg Tibia/fibula Left Port  Result Date: 12/23/2018 CLINICAL DATA:  BILATERAL tibial plateau fractures post surgery EXAM: PORTABLE LEFT TIBIA AND FIBULA - 2 VIEW COMPARISON:  Intraoperative  images [id number] FINDINGS: Lateral bar tests plate and multiple screws present at the proximal LEFT tibia post ORIF of Mikeal Winstanley proximal metaphyseal fracture. Knee and ankle joint alignments normal. Mildly displaced fracture of proximal LEFT fibular diaphysis also identified. IMPRESSION: Proximal LEFT fibular diaphyseal fracture, mildly displaced. Post ORIF of Katoya Amato proximal LEFT tibial metaphyseal fracture. Electronically Signed   By: Lavonia Dana M.D.   On: 12/23/2018 12:36   Dg C-arm 1-60 Min  Result Date: 12/23/2018 CLINICAL DATA:  ORIF. EXAM: DG C-ARM 61-120 MIN; LEFT KNEE - COMPLETE 4+ VIEW COMPARISON:  CT 12/21/2018. FINDINGS: Plate and screw fixation of the proximal tibia noted. Hardware intact. Near anatomic alignment. Proximal fibular fracture noted. IMPRESSION: Plate screw fixation of proximal tibia. Hardware intact. Near anatomic alignment. Electronically Signed   By: [place]   On: 12/23/2018 11:10        Scheduled Meds: . brimonidine  1 drop Left Eye BID  . cycloSPORINE  1 drop Both Eyes BID  . [START ON 12/24/2018] enoxaparin (LOVENOX) injection  40 mg Subcutaneous Q24H  .  HYDROcodone-acetaminophen      . HYDROmorphone      . latanoprost  1 drop Both Eyes QHS  . mometasone-formoterol  2 puff Inhalation BID  . mycophenolate  180 mg Oral BID  . polyethylene glycol  17 g Oral Daily  . prednisoLONE acetate  1 drop Left Eye QID  . prednisoLONE acetate  1 drop Right Eye Daily  . predniSONE  2.5 mg Oral Q supper  . [START ON 12/24/2018] predniSONE  5 mg Oral Q breakfast  . senna  1 tablet Oral QHS  . tacrolimus  0.5 mg Oral QHS  . tacrolimus  1 mg Oral Daily   Continuous Infusions: .  ceFAZolin (ANCEF) IV 2 g (12/23/18 1405)  . lactated ringers 100 mL/hr at 12/22/18 1716  . methocarbamol (ROBAXIN) IV       LOS: 2 days    Time spent: over 30 min    Fayrene Helper, MD [facility] Pager AMION  If 7PM-7AM, please contact night-coverage [email or web address] Password  [facility] 12/23/2018, 2:58 PM

## 2018-12-23 NOTE — Progress Notes (Signed)
Orthopedic Tech Progress Note Patient Details:  April [name] 1956/03/23 [id number] Called in order  Patient ID: April [name], female   DOB: [birth year], 63 y.o.   MRN: [id number]   Janit Pagan 12/23/2018, 12:41 PM

## 2018-12-23 NOTE — Anesthesia Postprocedure Evaluation (Signed)
Anesthesia Post Note  Patient: April [name]  Procedure(s) Performed: OPEN REDUCTION INTERNAL FIXATION (ORIF) TIBIAL PLATEAU (Right ) OPEN REDUCTION INTERNAL FIXATION (ORIF) TIBIA FRACTURE (Left )     Patient location during evaluation: PACU Anesthesia Type: General Level of consciousness: awake and alert Pain management: pain level controlled Vital Signs Assessment: post-procedure vital signs reviewed and stable Respiratory status: spontaneous breathing, nonlabored ventilation and respiratory function stable Cardiovascular status: blood pressure returned to baseline and stable Postop Assessment: no apparent nausea or vomiting Anesthetic complications: no    Last Vitals:  Vitals:   12/23/18 1115 12/23/18 1130  BP: 130/65 119/71  Pulse: (!) 102 (!) 102  Resp: 19 17  Temp:    SpO2: 94% 93%    Last Pain:  Vitals:   12/23/18 1030  TempSrc:   PainSc: [place]

## 2018-12-24 DIAGNOSIS — T148XXA Other injury of unspecified body region, initial encounter: Secondary | ICD-10-CM

## 2018-12-24 LAB — BASIC METABOLIC PANEL
ANION GAP: 12 (ref 5–15)
Anion gap: 12 (ref 5–15)
BUN: 39 mg/dL — ABNORMAL HIGH (ref 8–23)
BUN: 41 mg/dL — ABNORMAL HIGH (ref 8–23)
CO2: 22 mmol/L (ref 22–32)
CO2: 21 mmol/L — ABNORMAL LOW (ref 22–32)
Calcium: 9.1 mg/dL (ref 8.9–10.3)
Calcium: 8.7 mg/dL — ABNORMAL LOW (ref 8.9–10.3)
Chloride: 98 mmol/L (ref 98–111)
Chloride: 96 mmol/L — ABNORMAL LOW (ref 98–111)
Creatinine, Ser: 1.83 mg/dL — ABNORMAL HIGH (ref 0.44–1.00)
Creatinine, Ser: 1.67 mg/dL — ABNORMAL HIGH (ref 0.44–1.00)
GFR calc Af Amer: 34 mL/min — ABNORMAL LOW (ref >60)
GFR calc Af Amer: 38 mL/min — ABNORMAL LOW (ref >60)
GFR calc non Af Amer: 29 mL/min — ABNORMAL LOW (ref >60)
GFR calc non Af Amer: 33 mL/min — ABNORMAL LOW (ref >60)
Glucose, Bld: 148 mg/dL — ABNORMAL HIGH (ref 70–99)
Glucose, Bld: 142 mg/dL — ABNORMAL HIGH (ref 70–99)
Potassium: 5.5 mmol/L — ABNORMAL HIGH (ref 3.5–5.1)
Potassium: 5.2 mmol/L — ABNORMAL HIGH (ref 3.5–5.1)
SODIUM: 129 mmol/L — AB (ref 135–145)
Sodium: 132 mmol/L — ABNORMAL LOW (ref 135–145)

## 2018-12-24 LAB — URINALYSIS, ROUTINE W REFLEX MICROSCOPIC
Bilirubin Urine: NEGATIVE
Glucose, UA: NEGATIVE mg/dL
Hgb urine dipstick: NEGATIVE
KETONES UR: NEGATIVE mg/dL
Leukocytes, UA: NEGATIVE
Nitrite: NEGATIVE
PH: 5 (ref 5.0–8.0)
Protein, ur: NEGATIVE mg/dL
Specific Gravity, Urine: 1.012 (ref 1.005–1.030)

## 2018-12-24 LAB — CBC
HCT: 34 % — ABNORMAL LOW (ref 36.0–46.0)
Hemoglobin: 10 g/dL — ABNORMAL LOW (ref 12.0–15.0)
MCH: 28.4 pg (ref 26.0–34.0)
MCHC: 29.4 g/dL — ABNORMAL LOW (ref 30.0–36.0)
MCV: 96.6 fL (ref 80.0–100.0)
NRBC: 0 % (ref 0.0–0.2)
Platelets: 201 10*3/uL (ref 150–400)
RBC: 3.52 MIL/uL — ABNORMAL LOW (ref 3.87–5.11)
RDW: 18.2 % — AB (ref 11.5–15.5)
WBC: 22.1 10*3/uL — ABNORMAL HIGH (ref 4.0–10.5)

## 2018-12-24 LAB — HEMOGLOBIN A1C
Hgb A1c MFr Bld: 5.4 % (ref 4.8–5.6)
Mean Plasma Glucose: 108.28 mg/dL

## 2018-12-24 LAB — OSMOLALITY, URINE: Osmolality, Ur: 295 mOsm/kg — ABNORMAL LOW (ref 300–900)

## 2018-12-24 LAB — OSMOLALITY: Osmolality: 285 mOsm/kg (ref 275–295)

## 2018-12-24 LAB — SODIUM, URINE, RANDOM: Sodium, Ur: <10 mmol/L

## 2018-12-24 LAB — MAGNESIUM: Magnesium: 2 mg/dL (ref 1.7–2.4)

## 2018-12-24 MED ORDER — LACTATED RINGERS IV SOLN
INTRAVENOUS | Status: AC
  Administered 2018-12-24 – 2018-12-25 (×3): via INTRAVENOUS

## 2018-12-24 MED ORDER — CHOLECALCIFEROL 10 MCG (400 UNIT) PO TABS
1000.0000 [IU] | ORAL_TABLET | Freq: Every day | ORAL | Status: DC
  Administered 2018-12-24 – 2018-12-29 (×6): 1000 [IU] via ORAL
  Filled 2018-12-24 (×6): qty 3

## 2018-12-24 NOTE — Plan of Care (Signed)
  Problem: Activity: Goal: Risk for activity intolerance will decrease Outcome: Progressing   Problem: Nutrition: Goal: Adequate nutrition will be maintained Outcome: Progressing   Problem: Coping: Goal: Level of anxiety will decrease Outcome: Progressing   Problem: Elimination: Goal: Will not experience complications related to bowel motility Outcome: Progressing   Problem: Pain Managment: Goal: General experience of comfort will improve Outcome: Progressing   Problem: Safety: Goal: Ability to remain free from injury will improve Outcome: Progressing   Problem: Skin Integrity: Goal: Risk for impaired skin integrity will decrease Outcome: Progressing   

## 2018-12-24 NOTE — Progress Notes (Signed)
SPORTS MEDICINE AND JOINT REPLACEMENT  Lara Mulch, MD    Carlyon Shadow, PA-C [place], [place], [place]  [address]                             [phone]   PROGRESS NOTE  Subjective:  negative for Chest Pain  negative for Shortness of Breath  negative for Nausea/Vomiting   negative for Calf Pain  negative for Bowel Movement   Tolerating Diet: yes         Patient reports pain as 4 on 0-10 scale.    Objective: Vital signs in last 24 hours:    Patient Vitals for the past 24 hrs:  BP Temp Temp src Pulse Resp SpO2  12/24/18 0829 (!) 149/78 97.8 F (36.6 C) Oral 90 16 97 %  12/24/18 0553 134/70 97.6 F (36.4 C) Oral 91 - 98 %  12/24/18 0114 125/73 98 F (36.7 C) Oral 89 - 97 %  12/23/18 2041 135/67 97.8 F (36.6 C) Axillary 96 - 99 %  12/23/18 2027 - - - - - 90 %  12/23/18 1508 - - - - - 93 %  12/23/18 1359 115/60 97.7 F (36.5 C) Oral (!) 104 18 (!) 72 %  12/23/18 1130 119/71 - - (!) 102 17 93 %  12/23/18 1115 130/65 - - (!) 102 19 94 %  12/23/18 1100 130/63 - - (!) 105 (!) 24 93 %  12/23/18 1051 - - - (!) 106 20 94 %  12/23/18 1045 (!) 146/71 - - (!) 106 19 95 %  12/23/18 1030 (!) 148/68 - - (!) 103 (!) 21 93 %  12/23/18 1015 140/61 97.7 F (36.5 C) - (!) 106 19 96 %    @flow {1959:LAST@   Intake/Output from previous day:   01/31 0701 - 02/01 0700 In: 1500 [I.V.:1400] Out: 675 [Urine:600]   Intake/Output this shift:   No intake/output data recorded.   Intake/Output      01/31 0701 - 02/01 0700 02/01 0701 - 02/02 0700   P.O.     I.V. (mL/kg) 1400 (16.9)    IV Piggyback 100    Total Intake(mL/kg) 1500 (18.1)    Urine (mL/kg/hr) 600 (0.3)    Blood 75    Total Output 675    Net +825            LABORATORY DATA: Recent Labs    12/21/18 1959 12/22/18 0209 12/23/18 0244 12/24/18 0655  WBC 9.1 14.5* 20.2* 22.1*  HGB 11.5* 11.8* 11.0* 10.0*  HCT 40.0 40.2 36.2 34.0*  PLT 168 191 181 201   Recent Labs    12/21/18 1959 12/22/18 0209  12/23/18 0244 12/24/18 0655  NA 136 137 133* 132*  K 4.0 4.4 4.9 5.5*  CL 104 103 100 98  CO2 24 24 22 22   BUN 31* 26* 27* 39*  CREATININE 1.18* 1.27* 1.27* 1.83*  GLUCOSE 166* 209* 141* 148*  CALCIUM 9.1 9.4 9.1 9.1   Lab Results  Component Value Date   INR 1.10 01/29/2010    Examination:  General appearance: alert, cooperative and no distress Extremities: extremities normal, atraumatic, no cyanosis or edema  Wound Exam: clean, dry, intact   Drainage:  None: wound tissue dry  Motor Exam: Quadriceps and Hamstrings Intact  Sensory Exam: Superficial Peroneal, Deep Peroneal and Tibial normal   Assessment:    1 Day Post-Op  Procedure(s) (LRB): OPEN REDUCTION INTERNAL FIXATION (ORIF)  TIBIAL PLATEAU (Right) OPEN REDUCTION INTERNAL FIXATION (ORIF) TIBIA FRACTURE (Left)  ADDITIONAL DIAGNOSIS:  Active Problems:   Asthma, mild intermittent   Chronic diastolic CHF (congestive heart failure) (HCC)   Bilateral closed proximal tibial fracture   Closed right ankle fracture   Multiple fractures     Plan: Physical Therapy as ordered Non Weight Bearing (NWB)  DVT Prophylaxis:  Lovenox  DISCHARGE PLAN: TBD likely SNF  Donia Ast 12/24/2018, 8:43 AM

## 2018-12-24 NOTE — Progress Notes (Signed)
**Note De-Identified vi Obfusction** PROGRESS NOTE    April [name]  NGE:[id number] DOB: [birth year] DOA: 12/21/2018 PCP: April Crwy, MD   Brief Nrrtive:  April [name] is  63 y.o. femle with medicl history significnt of this post renl trnsplnt (h/o polycystic kidney disese), chronic kidney disese stge III, sthm, chronic distolic CHF who presented fter  mechnicl fll found to hve bilterl knee frctures.   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  Bowel regimen.  Follow vitmin D level (low, will need supplementtion)  Acute Kidney Injury on CKD  Hx Renl Trnsplnt  Hx PCKD  CKD I don't see ny obviously nephrotoxic meds on mr other thn vncomycin No obvious hypotension noted.  Possibly poor PO intke fter surgery yesterdy? Will check UA, increse IVF Recheck BMP in fternoon, lso hs mild hyperklemi Consider renl [place] nd discussion with renl if not improving Continue prednisone, tcrolimus, nd mycophenolte Discussed with ortho, ok with continuing  Hyperglycemi: check 1c, follow, consider SSI  Asthm  O2 Requirement:  No evidence of excerbtion Wened to 1 L this AM, continue to wen s tolerted dditionl w/u s needed  HFpEF: Per previous provider, pt with grde 1 distolic dysfunction.  I don't see echo in chrt.  In cre everywhere,  she hs unremrkble echo from 2013.    Nuse: possibly relted to pin meds bove (oxy cuses nuse for her).  Prn ntiemetics.  Leukocytosis: worsening tody, she continues to be febrile, will continue to monitor for now  DVT prophylxis: lovenox Code Sttus: full  Fmily Communiction: son t bedside Disposition Pln: pending further improvement, ortho sign off, PT evl  Consultnts:   orthopedics  Procedures:   none  Antimicrobils:  Anti-infectives (From dmission, onwrd)   Strt     Dose/Rte Route Frequency Ordered Stop   12/23/18 1400  ceFAZolin (ANCEF) IVPB 2g/100 mL premix     2 g 200 mL/hr over 30 Minutes Intrvenous Every 8 hours 12/23/18 1212 12/24/18 0612   12/23/18 0853  vncomycin (VANCOCIN) powder  Sttus:  Discontinued       As needed 12/23/18 0854 12/23/18 1008   12/23/18 0615  ceFAZolin (ANCEF) IVPB 2g/100 mL premix     2 g 200 mL/hr over 30 Minutes Intrvenous On cll to O.R. 12/22/18 1147 12/23/18 0800     Subjective: C/o nuse, pin Better thn yesterdy, but persistent Son t bedside tody  Objective: Vitls:   12/24/18 0114 12/24/18 0553 12/24/18 0829 12/24/18 0919  BP: 125/73 134/70 (!) 149/78   Pulse: 89 91 90   Resp:   16   Temp: 98 F (36.7 C) 97.6 F (36.4 C) 97.8 F (36.6 C)   TempSrc: Orl Orl Orl   SpO2: 97% 98% 97% 96%  Weight:      Height:        Intke/Output Summry (Lst 24 hours) t 12/24/2018 1020 Lst dt filed t 12/24/2018 0900 Gross per 24 **Note De-Identified vi Obfusction** hour  Intke 740 ml  Output 800 ml  Net -60 ml   Filed Weights   12/22/18 0139  Weight: 82.7 kg    Exmintion:  Generl: No cute distress. Crdiovsculr: Hert sounds show  regulr rte, nd rhythm. Lungs: Cler to usculttion bilterlly  bdomen: Soft, nontender, nondistended Neurologicl: lert nd oriented 3. Moves ll extremities 4. Crnil nerves II through XII grossly intct. Skin: Wrm nd dry. No rshes or lesions. Extremities: bilterl Le in  brce/dressing Psychitric: Mood nd ffect re norml. Insight nd judgment re pproprite.  Dt Reviewed: I hve personlly reviewed following lbs nd imging studies  CBC: Recent Lbs  Lb 12/21/18 1959 12/22/18 0209 12/23/18 0244 12/24/18 0655  WBC 9.1 14.5* 20.2* 22.1*  NEUTROBS 8.1*  --   --   --   HGB 11.5* 11.8* 11.0* 10.0*  HCT 40.0 40.2 36.2 34.0*  MCV 100.3* 96.4 95.5 96.6  PLT 168 191 181 503   Bsic Metbolic Pnel: Recent Lbs  Lb 12/21/18 1959 12/22/18 0209 12/23/18 0244 12/24/18 0655  N 136 137 133* 132*  K 4.0 4.4 4.9 5.5*  CL 104 103 100 98  CO2 24 24 22 22   GLUCOSE 166* 209* 141* 148*  BUN 31* 26* 27* 39*  CRETININE 1.18* 1.27* 1.27* 1.83*  CLCIUM 9.1 9.4 9.1 9.1  MG  --   --  1.9 2.0   GFR: Estimted Cretinine Clernce: 31.6 mL/min () (by [place] formul bsed on SCr of 1.83 mg/dL (H)). Liver Function Tests: Recent Lbs  Lb 12/21/18 1959 12/23/18 0244  ST 22 17  LT 15 12  LKPHOS 67 66  BILITOT 1.1 2.6*  PROT 7.3 7.3  LBUMIN 3.4* 2.8*   No results for input(s): LIPSE, MYLSE in the lst 168 hours. No results for input(s): MMONI in the lst 168 hours. Cogultion Profile: No results for input(s): INR, PROTIME in the lst 168 hours. Crdic Enzymes: No results for input(s): CKTOTL, CKMB, CKMBINDEX, TROPONINI in the lst 168 hours. BNP (lst 3 results) No results for input(s): PROBNP in the lst 8760 hours. Hb1C: No results for input(s): HGB1C in the lst 72 hours. CBG: No results for input(s): GLUCP in the lst 168 hours. Lipid Profile: No results for input(s): CHOL, HDL, LDLCLC, TRIG, CHOLHDL, LDLDIRECT in the lst 72 hours. Thyroid Function Tests: No results for input(s): TSH, T4TOTL, FREET4, T3FREE, THYROIDB in the lst 72 hours. nemi Pnel: No results for input(s): VITMINB12, FOLTE, FERRITIN, TIBC, IRON, RETICCTPCT in the lst 72 hours. Sepsis Lbs: No results for input(s): PROCLCITON, LTICCIDVEN  in the lst 168 hours.  Recent Results (from the pst 240 hour(s))  Surgicl pcr screen     Sttus: None   Collection Time: 12/22/18  1:29 M  Result Vlue Ref Rnge Sttus   MRS, PCR NEGTIVE NEGTIVE Finl   Stphylococcus ureus NEGTIVE NEGTIVE Finl    Comment: (NOTE) The Xpert S ssy (FD pproved for NSL specimens in ptients 69 yers of ge nd older), is one component of  comprehensive surveillnce progrm. It is not intended to dignose infection nor to guide or monitor tretment. Performed t [facility], ttll [address] [place], [place] [address]          Rdiology Studies: Dg Knee Complete 4 Views Left  Result Dte: 12/23/2018 CLINICL DT:  ORIF. EXM: DG C-RM 61-120 MIN; LEFT KNEE - COMPLETE 4+ VIEW COMPRISON:  CT 12/21/2018. FINDINGS: Plte nd screw fixtion of the proximl tibi noted. Hrdwre intct. Ner ntomic lignment. **Note De-Identified vi Obfusction** Proximl fibulr frcture noted. IMPRESSION: Plte screw fixtion of proximl tibi. Hrdwre intct. Ner ntomic lignment. Electroniclly Signed   By: Mrcello Moores  Register   On: 12/23/2018 11:10   Dg Knee Complete 4 Views Right  Result Dte: 12/23/2018 CLINICAL DATA:  ORIF EXAM: RIGHT KNEE - COMPLETE 4+ VIEW COMPARISON:  12/21/2018. FINDINGS: ORIF proximl right tibi.  Hrdwre intct.  Antomic lignment. IMPRESSION: ORIF proximl right tibi.  Antomic lignment. Electroniclly Signed   By: Stnley   On: 12/23/2018 11:14   Dg Knee Right Port  Result Dte: 12/23/2018 CLINICAL DATA:  S/P bilterl tibil plteu's. EXAM: PORTABLE RIGHT KNEE - 1-2 VIEW COMPARISON:  Rdiogrph 12/21/2018 FINDINGS: Internl plte fixtion tibil plteu frcture with dynmic fixtion plte nd 5 fixtion corticl screws. Antomic lignment. No compliction. IMPRESSION: Internl fixtion of tibil plteu frcture. Electroniclly Signed   By: Suzy Bouchrd M.D.   On: 12/23/2018 12:44   Dg Tibi/fibul Left Port  Result Dte:  12/23/2018 CLINICAL DATA:  BILATERAL tibil plteu frctures post surgery EXAM: PORTABLE LEFT TIBIA AND FIBULA - 2 VIEW COMPARISON:  Intropertive imges 1302020 FINDINGS: Lterl br tests plte nd multiple screws present t the proximl LEFT tibi post ORIF of  proximl metphysel frcture. Knee nd nkle joint lignments norml. Mildly displced frcture of proximl LEFT fibulr diphysis lso identified. IMPRESSION: Proximl LEFT fibulr diphysel frcture, mildly displced. Post ORIF of  proximl LEFT tibil metphysel frcture. Electroniclly Signed   By: Lvoni Dn M.D.   On: 12/23/2018 12:36   Dg C-rm 1-60 Min  Result Dte: 12/23/2018 CLINICAL DATA:  ORIF. EXAM: DG C-ARM 61-120 MIN; LEFT KNEE - COMPLETE 4+ VIEW COMPARISON:  CT 12/21/2018. FINDINGS: Plte nd screw fixtion of the proximl tibi noted. Hrdwre intct. Ner ntomic lignment. Proximl fibulr frcture noted. IMPRESSION: Plte screw fixtion of proximl tibi. Hrdwre intct. Ner ntomic lignment. Electroniclly Signed   By: Crystl Lkes   On: 12/23/2018 11:10        Scheduled Meds: . brimonidine  1 drop Left Eye BID  . choleclciferol  1,000 Units Orl Dily  . cycloSPORINE  1 drop Both Eyes BID  . enoxprin (LOVENOX) injection  40 mg Subcutneous Q24H  . ltnoprost  1 drop Both Eyes QHS  . mometsone-formoterol  2 puff Inhltion BID  . mycophenolte  180 mg Orl BID  . polyethylene glycol  17 g Orl Dily  . prednisoLONE cette  1 drop Left Eye QID  . prednisoLONE cette  1 drop Right Eye Dily  . predniSONE  2.5 mg Orl Q supper  . predniSONE  5 mg Orl Q brekfst  . senn  1 tblet Orl QHS  . tcrolimus  0.5 mg Orl QHS  . tcrolimus  1 mg Orl Dily   Continuous Infusions: . lctted ringers    . methocrbmol (ROBAXIN) IV       LOS: 3 dys    Time spent: over 30 min    Fyrene Helper, MD [facility] AMION  If 7PM-7AM, plese contct  night-coverge [email or web address] Pssword [email or web address] 12/24/2018, 10:20 AM

## 2018-12-24 NOTE — Evaluation (Signed)
Physical Therapy Evaluation Patient Details Name: April [name] MRN: [id number] DOB: 1956/02/23 Today's Date: 12/24/2018   History of Present Illness  Pt is a 63 yo female s/p fall resulting in R tibial plateau fx requiring ORIF and L ORIF to tibial fx. PMHx: arthritis and depression.  Clinical Impression  Patient required max a+2 to transfer to the edge of the bed. She was unable to transfer to the chair safely while maintaining weight bearing precautions. She would benefit from skilled therapy at [facility] or a SNF/ Skilled therapy will continue to work on transfers with the patient.     Follow Up Recommendations CIR;SNF    Equipment Recommendations  Rolling walker with 5" wheels    Recommendations for Other Services Rehab consult     Precautions / Restrictions Precautions Precautions: Knee;Fall Restrictions Weight Bearing Restrictions: Yes RLE Weight Bearing: Non weight bearing LLE Weight Bearing: Weight bearing as tolerated Other Position/Activity Restrictions: can pivot on left knee. No weight on the right       Mobility  Bed Mobility Overal bed mobility: Needs Assistance Bed Mobility: Supine to Sit;Sit to Supine Rolling: Max assist;+2 for physical assistance Sidelying to sit: Max assist;+2 for physical assistance;HOB elevated Supine to sit: +2 for physical assistance;+2 for safety/equipment;Max assist Sit to supine: +2 for physical assistance;+2 for safety/equipment;Max assist   General bed mobility comments: Max a to control legs in and out of bed. Max a to scoot to the edge of the bed  Transfers Overall transfer level: Needs assistance Equipment used: Rolling walker (2 wheeled) Transfers: Sit to/from Stand Sit to Stand: Total assist         General transfer comment: Therapy attmepted to stand patient 2 times. She wzas ubnable to stand without putting weight into the right LE. She was unable to come to a full standing position,.   Ambulation/Gait                 Stairs            Wheelchair Mobility    Modified Rankin (Stroke Patients Only)       Balance Overall balance assessment: Needs assistance   Sitting balance-Leahy Scale: Poor     Standing balance support: Bilateral upper extremity supported Standing balance-Leahy Scale: Zero                               Pertinent Vitals/Pain Pain Assessment: 0-10 Pain Score: 9  Pain Location: bilateral legs Pain Descriptors / Indicators: Burning;Jabbing Pain Intervention(s): Limited activity within patient's tolerance;Monitored during session;Premedicated before session;Repositioned    Home Living Family/patient expects to be discharged to:: Skilled nursing facility Living Arrangements: Spouse/significant other;Children Available Help at Discharge: Family;Available PRN/intermittently Type of Home: House Home Access: Ramped entrance     Home Layout: Two level;Able to live on main level with bedroom/bathroom Home Equipment: Walker - 2 wheels      Prior Function Level of Independence: Independent with assistive device(s)         Comments: amb with RW at times     Hand Dominance   Dominant Hand: Right    Extremity/Trunk Assessment   Upper Extremity Assessment Upper Extremity Assessment: Defer to OT evaluation    Lower Extremity Assessment Lower Extremity Assessment: LLE deficits/detail;RLE deficits/detail RLE Deficits / Details: NWB tibial plateau fx ORIF  LLE Deficits / Details: s/p L ORIF to tibial fx LLE: Unable to fully assess due to immobilization  Cervical / Trunk Assessment Cervical / Trunk Assessment: Normal  Communication   Communication: No difficulties  Cognition Arousal/Alertness: Awake/alert Behavior During Therapy: WFL for tasks assessed/performed Overall Cognitive Status: Within Functional Limits for tasks assessed                                        General Comments      Exercises      Assessment/Plan    PT Assessment Patient needs continued PT services  PT Problem List Decreased strength;Decreased range of motion;Decreased activity tolerance;Decreased balance;Decreased mobility;Decreased knowledge of use of DME;Pain       PT Treatment Interventions DME instruction;Gait training;Stair training;Functional mobility training;Therapeutic activities;Therapeutic exercise;Neuromuscular re-education;Patient/family education    PT Goals (Current goals can be found in the Care Plan section)  Acute Rehab PT Goals Patient Stated Goal: to get stronger to go home PT Goal Formulation: With patient Time For Goal Achievement: 12/31/18 Potential to Achieve Goals: Good    Frequency Min 5X/week   Barriers to discharge        Co-evaluation PT/OT/SLP Co-Evaluation/Treatment: Yes Reason for Co-Treatment: Complexity of the patient's impairments (multi-system involvement);Necessary to address cognition/behavior during functional activity;For patient/therapist safety;To address functional/ADL transfers PT goals addressed during session: Mobility/safety with mobility;Balance;Proper use of DME;Strengthening/ROM OT goals addressed during session: Strengthening/ROM;ADL's and self-care       AM-PAC PT "6 Clicks" Mobility  Outcome Measure Help needed turning from your back to your side while in a flat bed without using bedrails?: A Lot Help needed moving from lying on your back to sitting on the side of a flat bed without using bedrails?: A Lot Help needed moving to and from a bed to a chair (including a wheelchair)?: Total Help needed standing up from a chair using your arms (e.g., wheelchair or bedside chair)?: Total Help needed to walk in hospital room?: Total Help needed climbing 3-5 steps with a railing? : Total 6 Click Score: 8    End of Session Equipment Utilized During Treatment: Gait belt Activity Tolerance: Patient tolerated treatment well Patient left: in bed;with call  bell/phone within reach Nurse Communication: Mobility status PT Visit Diagnosis: Unsteadiness on feet (R26.81);Repeated falls (R29.6)    Time: 7425-9563 PT Time Calculation (min) (ACUTE ONLY): 30 min   Charges:   PT Evaluation $PT Eval High Complexity: 1 High            Carney Living PT DPT  12/24/2018, 2:59 PM

## 2018-12-24 NOTE — Evaluation (Signed)
Occupational Therapy Evaluation Patient Details Name: April [name] MRN: [id number] DOB: Sep 12, 1956 Today's Date: 12/24/2018    History of Present Illness Pt is a 63 yo female s/p fall resulting in R tibial plateau fx requiring ORIF and L ORIF to tibial fx. PMHx: arthritis and depression.   Clinical Impression   Pt is a 63 yo female s/p above dx. Pt PTA: living with family, independent with ADL and mobility. Pt currently, pt limited by severe pain in BLEs. Pt with b/l knee immobilizers and unable to move BLEs well. Pt performing UB self care with minA and LB self care with MaxA. Pt unable to safely sit to stand nor squat pivot with maxA +2. Pt would greatly benefit from continued OT skilled services for ADL, mobility and safety in CIR setting.    Follow Up Recommendations  CIR;Supervision/Assistance - 24 hour    Equipment Recommendations  3 in 1 bedside commode    Recommendations for Other Services       Precautions / Restrictions Precautions Precautions: Knee;Fall Restrictions Weight Bearing Restrictions: Yes RLE Weight Bearing: Non weight bearing LLE Weight Bearing: Weight bearing as tolerated Other Position/Activity Restrictions: can pivot on left knee. No weight on the right       Mobility Bed Mobility Overal bed mobility: Needs Assistance Bed Mobility: Supine to Sit;Sit to Supine Rolling: Max assist;+2 for physical assistance Sidelying to sit: Max assist;+2 for physical assistance;HOB elevated Supine to sit: +2 for physical assistance;+2 for safety/equipment;Max assist Sit to supine: +2 for physical assistance;+2 for safety/equipment;Max assist   General bed mobility comments: Max a to control legs in and out of bed. Max a to scoot to the edge of the bed  Transfers Overall transfer level: Needs assistance Equipment used: Rolling walker (2 wheeled) Transfers: Sit to/from Stand Sit to Stand: Total assist         General transfer comment: Therapy attmepted to  stand patient 2 times. She wzas ubnable to stand without putting weight into the right LE. She was unable to come to a full standing position,.     Balance Overall balance assessment: Needs assistance   Sitting balance-Leahy Scale: Poor                                     ADL either performed or assessed with clinical judgement   ADL Overall ADL's : Needs assistance/impaired Eating/Feeding: Modified independent   Grooming: Set up;Sitting   Upper Body Bathing: Minimal assistance;Sitting   Lower Body Bathing: Maximal assistance;Sitting/lateral leans   Upper Body Dressing : Minimal assistance;Sitting   Lower Body Dressing: Maximal assistance;Sitting/lateral leans;Bed level   Toilet Transfer: Total assistance(not ready for BSC)   Toileting- Clothing Manipulation and Hygiene: Maximal assistance;Sitting/lateral lean;Bed level       Functional mobility during ADLs: Maximal assistance;+2 for physical assistance;+2 for safety/equipment General ADL Comments: Pt performing ADLs UB with MinA due to R hand swelling and MaxA to [place] for LB ADL     Vision Baseline Vision/History: Wears glasses(previous retinal surgery still having effects) Wears Glasses: At all times Patient Visual Report: No change from baseline Vision Assessment?: Vision impaired- to be further tested in functional context     Perception     Praxis      Pertinent Vitals/Pain Pain Assessment: 0-10 Pain Score: 9  Pain Location: bilateral legs Pain Descriptors / Indicators: Burning;Jabbing Pain Intervention(s): Limited activity within patient's tolerance;Monitored during session;Premedicated before  session;Repositioned     Hand Dominance Right   Extremity/Trunk Assessment Upper Extremity Assessment Upper Extremity Assessment: Defer to OT evaluation   Lower Extremity Assessment Lower Extremity Assessment: LLE deficits/detail;RLE deficits/detail RLE Deficits / Details: NWB tibial plateau fx  ORIF  LLE Deficits / Details: s/p L ORIF to tibial fx LLE: Unable to fully assess due to immobilization   Cervical / Trunk Assessment Cervical / Trunk Assessment: Normal   Communication Communication Communication: No difficulties   Cognition Arousal/Alertness: Awake/alert Behavior During Therapy: WFL for tasks assessed/performed Overall Cognitive Status: Within Functional Limits for tasks assessed                                     General Comments       Exercises     Shoulder Instructions      Home Living Family/patient expects to be discharged to:: Skilled nursing facility Living Arrangements: Spouse/significant other;Children Available Help at Discharge: Family;Available PRN/intermittently Type of Home: House Home Access: Ramped entrance     Home Layout: Two level;Able to live on main level with bedroom/bathroom Alternate Level Stairs-Number of Steps: 5   Bathroom Shower/Tub: Teacher, early years/pre: Standard     Home Equipment: Environmental consultant - 2 wheels          Prior Functioning/Environment Level of Independence: Independent with assistive device(s)        Comments: amb with RW at times        OT Problem List: Decreased strength;Decreased activity tolerance;Impaired balance (sitting and/or standing);Decreased safety awareness;Pain      OT Treatment/Interventions: Self-care/ADL training;Therapeutic exercise;Energy conservation;Therapeutic activities;Patient/family education;Balance training;Neuromuscular education    OT Goals(Current goals can be found in the care plan section) Acute Rehab OT Goals Patient Stated Goal: to get stronger to go home OT Goal Formulation: With patient Time For Goal Achievement: 01/07/19 Potential to Achieve Goals: Good ADL Goals Pt Will Perform Grooming: with set-up;sitting Pt Will Perform Lower Body Dressing: with mod assist;sitting/lateral leans;bed level Pt Will Transfer to Toilet: with min  assist;with +2 assist;bedside commode Additional ADL Goal #1: Pt will perform transfers with modA +2 with sliding board  OT Frequency: Min 2X/week   Barriers to D/C: Inaccessible home environment          Co-evaluation PT/OT/SLP Co-Evaluation/Treatment: Yes Reason for Co-Treatment: Complexity of the patient's impairments (multi-system involvement);Necessary to address cognition/behavior during functional activity;For patient/therapist safety;To address functional/ADL transfers PT goals addressed during session: Mobility/safety with mobility;Balance;Proper use of DME;Strengthening/ROM OT goals addressed during session: Strengthening/ROM;ADL's and self-care      AM-PAC OT "6 Clicks" Daily Activity     Outcome Measure Help from another person eating meals?: None Help from another person taking care of personal grooming?: A Little Help from another person toileting, which includes using toliet, bedpan, or urinal?: Total Help from another person bathing (including washing, rinsing, drying)?: A Lot Help from another person to put on and taking off regular upper body clothing?: A Little Help from another person to put on and taking off regular lower body clothing?: Total 6 Click Score: 14   End of Session Equipment Utilized During Treatment: Gait belt;Rolling walker Nurse Communication: Mobility status  Activity Tolerance: Patient limited by pain;Treatment limited secondary to medical complications (Comment) Patient left: in bed;with call bell/phone within reach;with bed alarm set  OT Visit Diagnosis: Unsteadiness on feet (R26.81);Muscle weakness (generalized) (M62.81)  Time: 8250-0370 OT Time Calculation (min): 30 min Charges:  OT General Charges $OT Visit: 1 Visit OT Evaluation $OT Eval Moderate Complexity: 1 Mod  Darryl Nestle) Marsa Aris OTR/L Acute Rehabilitation Services Pager: [phone] Office: [phone]   Fredda Hammed 12/24/2018, 2:32 PM

## 2018-12-24 NOTE — Progress Notes (Signed)
Rehab Admissions Coordinator Note:  Per PT and OT recommendation, this patient was screened by Jhonnie Garner for appropriateness for an Inpatient Acute Rehab Consult.  At this time, we are recommending Inpatient Rehab consult. AC will contact MD regarding request for an IP Rehab Consult Order.   Jhonnie Garner 12/24/2018, 3:23 PM  I can be reached at [phone].

## 2018-12-25 ENCOUNTER — Inpatient Hospital Stay (HOSPITAL_COMMUNITY): Payer: Medicare Other

## 2018-12-25 LAB — CBC
HEMATOCRIT: 31.7 % — AB (ref 36.0–46.0)
Hemoglobin: 9.6 g/dL — ABNORMAL LOW (ref 12.0–15.0)
MCH: 28.9 pg (ref 26.0–34.0)
MCHC: 30.3 g/dL (ref 30.0–36.0)
MCV: 95.5 fL (ref 80.0–100.0)
Platelets: 210 10*3/uL (ref 150–400)
RBC: 3.32 MIL/uL — ABNORMAL LOW (ref 3.87–5.11)
RDW: 18 % — ABNORMAL HIGH (ref 11.5–15.5)
WBC: 19.5 10*3/uL — AB (ref 4.0–10.5)
nRBC: 0 % (ref 0.0–0.2)

## 2018-12-25 LAB — COMPREHENSIVE METABOLIC PANEL
ALT: 26 U/L (ref 0–44)
AST: 49 U/L — ABNORMAL HIGH (ref 15–41)
Albumin: 2.3 g/dL — ABNORMAL LOW (ref 3.5–5.0)
Alkaline Phosphatase: 70 U/L (ref 38–126)
Anion gap: 11 (ref 5–15)
BUN: 47 mg/dL — ABNORMAL HIGH (ref 8–23)
CO2: 21 mmol/L — ABNORMAL LOW (ref 22–32)
Calcium: 9 mg/dL (ref 8.9–10.3)
Chloride: 97 mmol/L — ABNORMAL LOW (ref 98–111)
Creatinine, Ser: 1.74 mg/dL — ABNORMAL HIGH (ref 0.44–1.00)
GFR calc Af Amer: 36 mL/min — ABNORMAL LOW (ref >60)
GFR calc non Af Amer: 31 mL/min — ABNORMAL LOW (ref >60)
Glucose, Bld: 139 mg/dL — ABNORMAL HIGH (ref 70–99)
Potassium: 5.1 mmol/L (ref 3.5–5.1)
Sodium: 129 mmol/L — ABNORMAL LOW (ref 135–145)
Total Bilirubin: 2.5 mg/dL — ABNORMAL HIGH (ref 0.3–1.2)
Total Protein: 6.7 g/dL (ref 6.5–8.1)

## 2018-12-25 LAB — MAGNESIUM: Magnesium: 2.2 mg/dL (ref 1.7–2.4)

## 2018-12-25 LAB — CK: Total CK: 128 U/L (ref 38–234)

## 2018-12-25 MED ORDER — SODIUM CHLORIDE 0.9 % IV SOLN
INTRAVENOUS | Status: AC
  Administered 2018-12-25: 12:00:00 via INTRAVENOUS

## 2018-12-25 MED ORDER — POLYETHYLENE GLYCOL 3350 17 G PO PACK
17.0000 g | PACK | Freq: Two times a day (BID) | ORAL | Status: DC
  Administered 2018-12-25 – 2018-12-29 (×7): 17 g via ORAL
  Filled 2018-12-25 (×8): qty 1

## 2018-12-25 MED ORDER — SENNA 8.6 MG PO TABS
2.0000 | ORAL_TABLET | Freq: Every day | ORAL | Status: DC
  Administered 2018-12-25 – 2018-12-28 (×4): 17.2 mg via ORAL
  Filled 2018-12-25 (×4): qty 2

## 2018-12-25 NOTE — Progress Notes (Signed)
RT NOTE: Patient sating 87% on RA. RT placed patient on nasal cannula 2L. Patient now sating 92%.

## 2018-12-25 NOTE — Plan of Care (Signed)
  Problem: Activity: Goal: Risk for activity intolerance will decrease Outcome: Progressing   Problem: Nutrition: Goal: Adequate nutrition will be maintained Outcome: Progressing   Problem: Coping: Goal: Level of anxiety will decrease Outcome: Progressing   Problem: Elimination: Goal: Will not experience complications related to bowel motility Outcome: Progressing   Problem: Pain Managment: Goal: General experience of comfort will improve Outcome: Progressing   Problem: Safety: Goal: Ability to remain free from injury will improve Outcome: Progressing   Problem: Skin Integrity: Goal: Risk for impaired skin integrity will decrease Outcome: Progressing   

## 2018-12-25 NOTE — Progress Notes (Signed)
PROGRESS NOTE    April [name]  YIF:[id number] DOB: [birth year] DOA: 12/21/2018 PCP: [name], MD   Brief Narrative:  April [name] is April [name] 63 y.o. female with medical history significant of this post renal transplant (h/o polycystic kidney disease), chronic kidney disease stage III, asthma, chronic diastolic CHF who presented after April [name] mechanical fall found to have bilateral knee fractures.   Assessment & Plan:   Active Problems:   Asthma, mild intermittent   Chronic diastolic CHF (congestive heart failure) (HCC)   Bilateral closed proximal tibial fracture   Closed right ankle fracture   Fracture   Right Lateral Tibial Plateau Fracture Left Comminuted Displaced Fracture of Proximal Tibial Metaphysis Comminuted Displaced Proximal Fibular Diaphyseal Fracture Non displaced Fracture of Distal Right Fibula S/p ORIF R lateral tibial plateau fx, ORIF L proximal tibial fracture, stress examination of R ankle on 1/31 by ortho Plan for nonweightbearing (can stand pivot transfer to LLE, but NWB to RLE) Lovenox for DVT ppx Norco, morphine prn pain control.  Bowel regimen.  Follow vitamin D level (low, start supplementation) PT/OT recommending CIR   Acute Kidney Injury on CKD  Hyponatremia  Hx Renal Transplant  Hx PCKD  CKD I don't see any obviously nephrotoxic meds on mar other than vancomycin No obvious hypotension noted.  Possibly poor PO intake after surgery yesterday? UA bland.  Urine sodium low, suggestive of pre renal etiology.  She does not appear grossly overloaded. Follow CXR Renal US is pending Renal c/s given lack of improvement, appreciate recs - suspects mild ATN Pt also hyponatremic, continue IVF Continue prednisone, tacrolimus, and mycophenolate  Hyperglycemia: A1c 5.4, continue to monitor BG  Asthma  O2 Requirement:  No evidence of exacerbation Continues to require supplemental O2 Follow CXR today additional w/u as needed  HFpEF: Per previous  provider, pt with grade 1 diastolic dysfunction.  I don't see echo in chart.  In care everywhere, she has unremarkable echo from 2013.    Nausea: possibly related to pain meds above (oxy causes nausea for her).  Prn antiemetics.  Leukocytosis: improved, persistent.  Continue to monitor.  Afebrile.   Elevated LFT's: mild, follow  DVT prophylaxis: lovenox Code Status: full  Family Communication: daughter at bedside Disposition Plan: pending further improvement, ortho sign off, PT eval  Consultants:   orthopedics  Procedures:   none  Antimicrobials:  Anti-infectives (From admission, onward)   Start     Dose/Rate Route Frequency Ordered Stop   12/23/18 1400  ceFAZolin (ANCEF) IVPB 2g/100 mL premix     2 g 200 mL/hr over 30 Minutes Intravenous Every 8 hours 12/23/18 1212 12/24/18 0612   12/23/18 0853  vancomycin (VANCOCIN) powder  Status:  Discontinued       As needed 12/23/18 0854 12/23/18 1008   12/23/18 0615  ceFAZolin (ANCEF) IVPB 2g/100 mL premix     2 g 200 mL/hr over 30 Minutes Intravenous On call to O.R. 12/22/18 1147 12/23/18 0800     Subjective: C/o persistent pain, overall better Nausea better  Objective: Vitals:   12/24/18 2053 12/25/18 0538 12/25/18 0811 12/25/18 0923  BP:  131/72 128/73   Pulse:  96 90   Resp:   16   Temp:  98.1 F (36.7 C)    TempSrc:  Oral    SpO2: 95% 92% 91% 92%  Weight:      Height:        Intake/Output Summary (Last 24 hours) at 12/25/2018 1309 Last data filed at 12/25/2018 1143  Gross per 24 hour  Intake 3151.04 ml  Output 1450 ml  Net 1701.04 ml   Filed Weights   12/22/18 0139  Weight: 82.7 kg    Examination:  General: No acute distress. Cardiovascular: Heart sounds show April [name] regular rate, and rhythm.  Lungs: Clear to auscultation bilaterally Abdomen: Soft, nontender, nondistended Neurological: Alert and oriented 3. Moves all extremities 4. Cranial nerves II through XII grossly intact. Skin: Warm and dry. No rashes or  lesions. Extremities: bilateral LE in dressing/brace Psychiatric: Mood and affect are normal. Insight and judgment are appropriate.  Data Reviewed: I have personally reviewed following labs and imaging studies  CBC: Recent Labs  Lab 12/21/18 1959 12/22/18 0209 12/23/18 0244 12/24/18 0655 12/25/18 0309  WBC 9.1 14.5* 20.2* 22.1* 19.5*  NEUTROABS 8.1*  --   --   --   --   HGB 11.5* 11.8* 11.0* 10.0* 9.6*  HCT 40.0 40.2 36.2 34.0* 31.7*  MCV 100.3* 96.4 95.5 96.6 95.5  PLT 168 191 181 201 828   Basic Metabolic Panel: Recent Labs  Lab 12/22/18 0209 12/23/18 0244 12/24/18 0655 12/24/18 1558 12/25/18 0309  NA 137 133* 132* 129* 129*  K 4.4 4.9 5.5* 5.2* 5.1  CL 103 100 98 96* 97*  CO2 24 22 22  21* 21*  GLUCOSE 209* 141* 148* 142* 139*  BUN 26* 27* 39* 41* 47*  CREATININE 1.27* 1.27* 1.83* 1.67* 1.74*  CALCIUM 9.4 9.1 9.1 8.7* 9.0  MG  --  1.9 2.0  --  2.2   GFR: Estimated Creatinine Clearance: 33.3 mL/min (April [name]) (by C-G formula based on SCr of 1.74 mg/dL (H)). Liver Function Tests: Recent Labs  Lab 12/21/18 1959 12/23/18 0244 12/25/18 0309  AST 22 17 49*  ALT 15 12 26   ALKPHOS 67 66 70  BILITOT 1.1 2.6* 2.5*  PROT 7.3 7.3 6.7  ALBUMIN 3.4* 2.8* 2.3*   No results for input(s): LIPASE, AMYLASE in the last 168 hours. No results for input(s): AMMONIA in the last 168 hours. Coagulation Profile: No results for input(s): INR, PROTIME in the last 168 hours. Cardiac Enzymes: Recent Labs  Lab 12/25/18 0309  CKTOTAL 128   BNP (last 3 results) No results for input(s): PROBNP in the last 8760 hours. HbA1C: Recent Labs    12/24/18 0655  HGBA1C 5.4   CBG: No results for input(s): GLUCAP in the last 168 hours. Lipid Profile: No results for input(s): CHOL, HDL, LDLCALC, TRIG, CHOLHDL, LDLDIRECT in the last 72 hours. Thyroid Function Tests: No results for input(s): TSH, T4TOTAL, FREET4, T3FREE, THYROIDAB in the last 72 hours. Anemia Panel: No results for input(s):  VITAMINB12, FOLATE, FERRITIN, TIBC, IRON, RETICCTPCT in the last 72 hours. Sepsis Labs: No results for input(s): PROCALCITON, LATICACIDVEN in the last 168 hours.  Recent Results (from the past 240 hour(s))  Surgical pcr screen     Status: None   Collection Time: 12/22/18  1:29 AM  Result Value Ref Range Status   MRSA, PCR NEGATIVE NEGATIVE Final   Staphylococcus aureus NEGATIVE NEGATIVE Final    Comment: (NOTE) The Xpert SA Assay (FDA approved for NASAL specimens in patients 77 years of age and older), is one component of [facility] comprehensive surveillance program. It is not intended to diagnose infection nor to guide or monitor treatment. Performed at [facility], [place] [address], [place],  [address]          Radiology Studies: No results found.      Scheduled Meds: . brimonidine  1 drop Left Eye BID  . cholecalciferol  1,000 Units Oral Daily  . cycloSPORINE  1 drop Both Eyes BID  . enoxaparin (LOVENOX) injection  40 mg Subcutaneous Q24H  . latanoprost  1 drop Both Eyes QHS  . mometasone-formoterol  2 puff Inhalation BID  . mycophenolate  180 mg Oral BID  . polyethylene glycol  17 g Oral BID  . prednisoLONE acetate  1 drop Left Eye QID  . prednisoLONE acetate  1 drop Right Eye Daily  . predniSONE  2.5 mg Oral Q supper  . predniSONE  5 mg Oral Q breakfast  . senna  2 tablet Oral QHS  . tacrolimus  0.5 mg Oral QHS  . tacrolimus  1 mg Oral Daily   Continuous Infusions: . sodium chloride 75 mL/hr at 12/25/18 1143  . methocarbamol (ROBAXIN) IV       LOS: 4 days    Time spent: over 30 min    Fayrene Helper, MD [facility] Pager AMION  If 7PM-7AM, please contact night-coverage [email or web address] Password [email or web address] 12/25/2018, 1:09 PM

## 2018-12-25 NOTE — Consult Note (Signed)
April [name] Admit Date: 12/21/2018 12/25/2018 April [name] Requesting Physician:  April Llanos [name]  Reason for Consult:  AoCKD3, renal transplant HPI:  63 year old female who had a mechanical fall on 1/29 and sustained fractures to bilateral tibia as well as right ankle.  Patient went for ORIF of bilateral knees on 1/31.  PMH Incudes:  Status post kidney transplant 2013, primary disease polycystic kidney disease, on immunosuppression mycophenolate, tacrolimus, prednisone.  Follows with April [name] at our office  Asthma  Osteoporosis  History of diastolic dysfunction  Patient's admission creatinine was 1.2, consistent with outpatient creatinine of 1.1-1.3.  Subsequently her creatinine has increased to evaluate 1.7 today.  Labs are also notable for mild hyponatremia with sodium of 129, potassium 5.1, bicarbonate 21.  She has a leukocytosis with WBC of 19.5, hemoglobin 9.6, platelet count 210.  Urine analysis on 2/1 without hemoglobin, protein.  Urine sodium on 10/1 was less than 10.  She made 1.2 L of urine yesterday.  She has been maintained on outpatient immunosuppression.  No IV contrast exposure.  No nonsteroidal use.  Has received cefazolin and vancomycin in a preoperative setting.  Currently receiving LR at 125 mL/h.  Is 2.5 L positive since admission.   Creatinine, Ser (mg/dL)  Date Value  12/25/2018 1.74 (H)  12/24/2018 1.67 (H)  12/24/2018 1.83 (H)  12/23/2018 1.27 (H)  12/22/2018 1.27 (H)  12/21/2018 1.18 (H)  10/05/2018 1.17 (H)  10/04/2018 1.16 (H)  10/03/2018 1.14 (H)  10/02/2018 1.09 (H)  ] I/Os: I/O last 3 completed shifts: In: 2870.4 [P.O.:660; I.V.:2210.4] Out: 1200 [Urine:1200]   ROS NSAIDS: No exposure IV Contrast no exposure TMP/SMX no exposure Hypotension no exposure Balance of 12 systems is negative w/ exceptions as above  PMH  Past Medical History:  Diagnosis Date  . Arthritis   . Asthma   . Depression   . GERD (gastroesophageal reflux  disease)   . Hyperlipidemia   . Hyperparathyroidism   . Polycystic kidney   . PONV (postoperative nausea and vomiting)    PSH  Past Surgical History:  Procedure Laterality Date  . 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 BILATERAL OOPHORECTOMY    . CARPAL TUNNEL RELEASE  2000  . CESAREAN SECTION    . INSERTION OF DIALYSIS CATHETER  03/31/2012   Procedure: INSERTION OF DIALYSIS CATHETER;  Surgeon: April Mould, [name];  Location: [facility];  Service: Vascular;  Laterality: N/A;  insertion of dialysis catheter right internal jugular vein  . 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 TONSILLECTOMY  1967  . TUBAL LIGATION  2010   FH  Family History  Problem Relation Age of Onset  . Hypertension Mother   . Heart disease Father        CABG history  . Polycystic kidney disease Father   . Polycystic kidney disease Brother    SH  reports that she quit smoking about 20 years ago. Her smoking use included cigarettes. She has a 3.75 pack-year smoking history. She has never used smokeless tobacco. She reports that she does not drink alcohol or use drugs. Allergies  Allergies  Allergen Reactions  . Infed [Iron Dextran] Other (See Comments)    Chest tightness  . Pentamidine Itching, Shortness Of Breath and Swelling  . Erythromycin [Erythromycin] Other (See Comments)    Mouth Ulcers  . Oxycodone Nausea Only and Nausea And Vomiting  . Erythromycin Rash    Causes breakout in mouth  .  Ultram [Tramadol Hcl] Anxiety   Home medications Prior to Admission medications   Medication Sig Start Date End Date Taking? Authorizing Provider  acetaminophen (TYLENOL) 500 MG tablet Take 1,000 mg by mouth every 6 (six) hours as needed for moderate pain.   Yes April [name]  acyclovir (ZOVIRAX) 400 MG tablet Take 400 mg by mouth 2 (two) times daily as needed (outbreaks).    Yes April [name]  albuterol (PROVENTIL HFA;VENTOLIN HFA) 108 (90 Base) MCG/ACT inhaler Inhale 2  puffs into the lungs every 4 (four) hours as needed for wheezing or shortness of breath. 07/16/16  Yes Ghimire, Henreitta Leber, [name]  alendronate (FOSAMAX) 70 MG tablet Take 70 mg by mouth every Friday.  05/26/18  Yes April [name]  allopurinol (ZYLOPRIM) 300 MG tablet Take 300 mg by mouth daily.   Yes April [name]  aspirin EC 81 MG tablet Take 81 mg by mouth daily.   Yes April [name]  brimonidine (ALPHAGAN P) 0.1 % SOLN Place 1 drop 2 (two) times daily into the left eye.   Yes April [name]  budesonide-formoterol (SYMBICORT) 160-4.5 MCG/ACT inhaler Inhale 2 puffs 2 (two) times daily as needed into the lungs (SOB).    Yes April [name]  cholecalciferol (VITAMIN D3) 25 MCG (1000 UT) tablet Take 1,000 Units by mouth every other day.    Yes April [name]  Colchicine 0.6 MG CAPS Take 0.6 mg by mouth daily as needed (gout).    Yes April [name]  cycloSPORINE (RESTASIS) 0.05 % ophthalmic emulsion Place 1 drop into both eyes 2 (two) times daily.   Yes April [name]  FLUoxetine (PROZAC) 40 MG capsule Take 40 mg by mouth daily.   Yes April [name]  furosemide (LASIX) 40 MG tablet Take 1 tablet by mouth daily as needed for edema.  04/20/14  Yes April [name]  latanoprost (XALATAN) 0.005 % ophthalmic solution Place 1 drop into both eyes at bedtime.   Yes April [name]  magnesium oxide (MAG-OX) 400 MG tablet Take 400 mg by mouth 2 (two) times daily.   Yes April [name]  mycophenolate (MYFORTIC) 180 MG EC tablet Take 180 mg by mouth 2 (two) times daily.   Yes April [name]  pantoprazole (PROTONIX) 40 MG tablet Take 40 mg by mouth daily as needed (acid reflux).    Yes April [name]  prednisoLONE acetate (PRED FORTE) 1 % ophthalmic suspension Place 1 drop See admin instructions into both eyes. 1 drop left eye four times daily. 1 drop right eye once daily.   Yes Provider,  Historical, [name]  predniSONE (DELTASONE) 5 MG tablet Take 2.5-5 mg by mouth See admin instructions. Take 5mg  by mouth in the morning and 2.5mg  in the evening   Yes April [name]  tacrolimus (PROGRAF) 0.5 MG capsule Take 0.5 mg by mouth daily. Take 0.5mg  by mouth daily at 22:00. Pt is taking brand name prograf.   Yes April [name]  tacrolimus (PROGRAF) 1 MG capsule Take 1 mg by mouth daily. Take 1mg  by mouth each morning at 10:00. Pt is taking brand name prograf. 12/17/17 12/29/18 Yes April [name]  TRAVATAN Z 0.004 % SOLN ophthalmic solution Place 1 drop into both eyes at bedtime. 07/03/16  Yes April [name]  vitamin B-12 (CYANOCOBALAMIN) 100 MCG tablet Take 100 mcg by mouth daily.   Yes April [name]    Current Medications Scheduled Meds: . brimonidine  1 drop Left  Eye BID  . cholecalciferol  1,000 Units Oral Daily  . cycloSPORINE  1 drop Both Eyes BID  . enoxaparin (LOVENOX) injection  40 mg Subcutaneous Q24H  . latanoprost  1 drop Both Eyes QHS  . mometasone-formoterol  2 puff Inhalation BID  . mycophenolate  180 mg Oral BID  . polyethylene glycol  17 g Oral Daily  . prednisoLONE acetate  1 drop Left Eye QID  . prednisoLONE acetate  1 drop Right Eye Daily  . predniSONE  2.5 mg Oral Q supper  . predniSONE  5 mg Oral Q breakfast  . senna  1 tablet Oral QHS  . tacrolimus  0.5 mg Oral QHS  . tacrolimus  1 mg Oral Daily   Continuous Infusions: . methocarbamol (ROBAXIN) IV     PRN Meds:.albuterol, HYDROcodone-acetaminophen, methocarbamol **OR** methocarbamol (ROBAXIN) IV, morphine injection, ondansetron (ZOFRAN) IV  CBC Recent Labs  Lab 12/21/18 1959  12/23/18 0244 12/24/18 0655 12/25/18 0309  WBC 9.1   < > 20.2* 22.1* 19.5*  NEUTROABS 8.1*  --   --   --   --   HGB 11.5*   < > 11.0* 10.0* 9.6*  HCT 40.0   < > 36.2 34.0* 31.7*  MCV 100.3*   < > 95.5 96.6 95.5  PLT 168   < > 181 201 210   < > = values in this interval not  displayed.   Basic Metabolic Panel Recent Labs  Lab 12/21/18 1959 12/22/18 0209 12/23/18 0244 12/24/18 0655 12/24/18 1558 12/25/18 0309  NA 136 137 133* 132* 129* 129*  K 4.0 4.4 4.9 5.5* 5.2* 5.1  CL 104 103 100 98 96* 97*  CO2 24 24 22 22  21* 21*  GLUCOSE 166* 209* 141* 148* 142* 139*  BUN 31* 26* 27* 39* 41* 47*  CREATININE 1.18* 1.27* 1.27* 1.83* 1.67* 1.74*  CALCIUM 9.1 9.4 9.1 9.1 8.7* 9.0    Physical Exam  Blood pressure 128/73, pulse 90, temperature 98.1 F (36.7 C), temperature source Oral, resp. rate 16, height 5' 1.2" (1.554 m), weight 82.7 kg, SpO2 92 %. GEN: Mild disorientation after pain medication, alert, conversant ENT: NCAT EYES: EOMI CV: Regular, normal S1-S2 PULM: Clear bilaterally ABD: Soft, , no focal tenderness SKIN: No rashes or lesions; bilateral knees immobilized and bandaged EXT: Difficult to assess with bandaging, trace to 1+ present   Assessment 69F with AoCKD3 with kidney transplant after b/l tibial fractures s/p ORIF  1. Nonoliguric AoCKD3T 1. BL SCr around 1.2;  2. Home IS is Tac/MMF/Pred -- cont at this time 3. No clear nephrotoxin exposure; no obvious hypotension 4. U Na suggests pre-renal but has had decent fluid challenge 5. Would cont maintenance IVFs at this time: NS @ 19mL/h given mild inc in K 6. F/u renal US 7. Check CK but doubt any rhabdo given neg Hb on UA 8. Most likely mild ATN 2. Chronic IS as above 3. S/p b/l tibial fractures s/p ORIF b/l 12/23/18  Plan 1. As above.  Daily weights, Daily Renal Panel, Strict I/Os, Avoid nephrotoxins (NSAIDs, judicious IV Contrast)    Pearson Grippe [name]  12/25/2018, 10:53 AM

## 2018-12-26 ENCOUNTER — Encounter (HOSPITAL_COMMUNITY): Payer: Self-pay | Admitting: *Deleted

## 2018-12-26 LAB — COMPREHENSIVE METABOLIC PANEL
ALT: 23 U/L (ref 0–44)
AST: 41 U/L (ref 15–41)
Albumin: 2.2 g/dL — ABNORMAL LOW (ref 3.5–5.0)
Alkaline Phosphatase: 74 U/L (ref 38–126)
Anion gap: 13 (ref 5–15)
BUN: 48 mg/dL — AB (ref 8–23)
CALCIUM: 9.1 mg/dL (ref 8.9–10.3)
CO2: 20 mmol/L — ABNORMAL LOW (ref 22–32)
CREATININE: 1.32 mg/dL — AB (ref 0.44–1.00)
Chloride: 98 mmol/L (ref 98–111)
GFR calc Af Amer: 50 mL/min — ABNORMAL LOW (ref >60)
GFR calc non Af Amer: 43 mL/min — ABNORMAL LOW (ref >60)
Glucose, Bld: 121 mg/dL — ABNORMAL HIGH (ref 70–99)
Potassium: 4.9 mmol/L (ref 3.5–5.1)
Sodium: 131 mmol/L — ABNORMAL LOW (ref 135–145)
Total Bilirubin: 2.2 mg/dL — ABNORMAL HIGH (ref 0.3–1.2)
Total Protein: 5.8 g/dL — ABNORMAL LOW (ref 6.5–8.1)

## 2018-12-26 LAB — URINALYSIS, ROUTINE W REFLEX MICROSCOPIC
Bacteria, UA: NONE SEEN
Bilirubin Urine: NEGATIVE
Glucose, UA: NEGATIVE mg/dL
Ketones, ur: NEGATIVE mg/dL
Leukocytes, UA: NEGATIVE
Nitrite: NEGATIVE
Protein, ur: NEGATIVE mg/dL
Specific Gravity, Urine: 1.008 (ref 1.005–1.030)
pH: 6 (ref 5.0–8.0)

## 2018-12-26 LAB — CBC
HEMATOCRIT: 31.7 % — AB (ref 36.0–46.0)
Hemoglobin: 9.3 g/dL — ABNORMAL LOW (ref 12.0–15.0)
MCH: 28.2 pg (ref 26.0–34.0)
MCHC: 29.3 g/dL — ABNORMAL LOW (ref 30.0–36.0)
MCV: 96.1 fL (ref 80.0–100.0)
NRBC: 0.2 % (ref 0.0–0.2)
Platelets: 159 10*3/uL (ref 150–400)
RBC: 3.3 MIL/uL — ABNORMAL LOW (ref 3.87–5.11)
RDW: 17.8 % — ABNORMAL HIGH (ref 11.5–15.5)
WBC: 10.7 10*3/uL — ABNORMAL HIGH (ref 4.0–10.5)

## 2018-12-26 LAB — MAGNESIUM: Magnesium: 2.3 mg/dL (ref 1.7–2.4)

## 2018-12-26 MED ORDER — TRAZODONE HCL 50 MG PO TABS
50.0000 mg | ORAL_TABLET | Freq: Every day | ORAL | Status: DC
  Administered 2018-12-26 – 2018-12-28 (×3): 50 mg via ORAL
  Filled 2018-12-26 (×3): qty 1

## 2018-12-26 MED ORDER — ACETAMINOPHEN 325 MG PO TABS: 650.0000 mg | ORAL_TABLET | Freq: Four times a day (QID) | ORAL | Status: DC | PRN

## 2018-12-26 NOTE — Progress Notes (Signed)
Physical Therapy Treatment Patient Details Name: April [name] MRN: [id number] DOB: July 11, 1956 Today's Date: 12/26/2018    History of Present Illness Pt is a 63 yo female s/p fall resulting in R tibial plateau fx requiring ORIF and L ORIF to tibial fx. PMHx: arthritis and depression.    PT Comments    Patient seen for mobility progression. Pt requires max/total A +2-3 for bed mobility and functional transfer training. Pt will continue to benefit from further skilled PT services both acute and post acute to maximize independence and safety with mobility.    Follow Up Recommendations  CIR;SNF     Equipment Recommendations  Other (comment)(TBD next venue)    Recommendations for Other Services Rehab consult     Precautions / Restrictions Precautions Precautions: Knee;Fall Precaution Comments: hinged braces locked in extension at night and unlocked during the day for unrestricted ROM Required Braces or Orthoses: Other Brace(bilateral Hinged knee braces) Restrictions Weight Bearing Restrictions: Yes RLE Weight Bearing: Non weight bearing LLE Weight Bearing: Weight bearing as tolerated Other Position/Activity Restrictions: can pivot on left LE; NWB on R LE    Mobility  Bed Mobility Overal bed mobility: Needs Assistance Bed Mobility: Rolling;Sidelying to Sit Rolling: Max assist;+2 for physical assistance Sidelying to sit: Max assist;+2 for physical assistance;HOB elevated Supine to sit: +2 for physical assistance;+2 for safety/equipment;Max assist     General bed mobility comments: Max a to control legs in and out of bed. Max a to scoot to the edge of the bed. VCs to use BUE to assist with pushing to seated position at EOB.   Transfers Overall transfer level: Needs assistance Equipment used: Rolling walker (2 wheeled) Transfers: Sit to/from Stand;Lateral/Scoot Transfers Sit to Stand: Total assist;+2 physical assistance;From elevated surface        Lateral/Scoot  Transfers: Max assist;Total assist(+3 ) General transfer comment: pt needs cues to maintain anterior translation of trunk while sitting EOB and when working on transfers; attempted sit to stand transfer but pt unable to achieve standing and pt attempting to put weight through bilat LE despite max multimodal cues; +3 for lateral scoot transfer EOB to drop arm recliner using bed pad and gait belt; therapist maintained NWB R LE during transfer; due to difficulty of transfer a maxi move lift pad was placed behind pt in recliner for nursing staff use   Ambulation/Gait                 Stairs             Wheelchair Mobility    Modified Rankin (Stroke Patients Only)       Balance Overall balance assessment: Needs assistance Sitting-balance support: Bilateral upper extremity supported Sitting balance-Leahy Scale: Poor     Standing balance support: Bilateral upper extremity supported Standing balance-Leahy Scale: Zero                              Cognition Arousal/Alertness: Awake/alert Behavior During Therapy: Restless Overall Cognitive Status: Within Functional Limits for tasks assessed                                 General Comments: Pt appeared fatigue at the start of session.       Exercises      General Comments        Pertinent Vitals/Pain Pain Assessment: 0-10 Pain Score: 8  Pain Location:  bilateral legs Pain Descriptors / Indicators: Burning;Jabbing Pain Intervention(s): Limited activity within patient's tolerance;Monitored during session;Repositioned;Patient requesting pain meds-RN notified    Home Living                      Prior Function            PT Goals (current goals can now be found in the care plan section) Acute Rehab PT Goals Patient Stated Goal: to get stronger to go home Progress towards PT goals: Progressing toward goals    Frequency    Min 5X/week      PT Plan Current plan remains  appropriate    Co-evaluation PT/OT/SLP Co-Evaluation/Treatment: Yes Reason for Co-Treatment: For patient/therapist safety;To address functional/ADL transfers PT goals addressed during session: Mobility/safety with mobility;Balance;Proper use of DME OT goals addressed during session: ADL's and self-care;Proper use of Adaptive equipment and DME;Strengthening/ROM      AM-PAC PT "6 Clicks" Mobility   Outcome Measure  Help needed turning from your back to your side while in a flat bed without using bedrails?: A Lot Help needed moving from lying on your back to sitting on the side of a flat bed without using bedrails?: A Lot Help needed moving to and from a bed to a chair (including a wheelchair)?: Total Help needed standing up from a chair using your arms (e.g., wheelchair or bedside chair)?: Total Help needed to walk in hospital room?: Total Help needed climbing 3-5 steps with a railing? : Total 6 Click Score: 8    End of Session Equipment Utilized During Treatment: Gait belt Activity Tolerance: Patient tolerated treatment well Patient left: in chair;with call bell/phone within reach;with family/visitor present Nurse Communication: Mobility status;Other (comment)(use of lift for return to bed) PT Visit Diagnosis: Unsteadiness on feet (R26.81);Repeated falls (R29.6)     Time: 6808-8110 PT Time Calculation (min) (ACUTE ONLY): 32 min  Charges:  $Gait Training: 8-22 mins                     Earney Navy, PTA Acute Rehabilitation Services Pager: [phone] Office: [phone]     Darliss Cheney 12/26/2018, 5:27 PM

## 2018-12-26 NOTE — Progress Notes (Signed)
Occupational Therapy Treatment Patient Details Name: April [name] MRN: [id number] DOB: Apr 14, 1956 Today's Date: 12/26/2018    History of present illness Pt is a 63 yo female s/p fall resulting in R tibial plateau fx requiring ORIF and L ORIF to tibial fx. PMHx: arthritis and depression.   OT comments  Pt not progressing toward OT goals. Pt demonstrates difficulty with functional transfers due to decreased stregnth and increased pain. Pt required Max A bed mobility to transition from supine to sit at EOB with rolling to R side and powering up to sit up right. Total A +3 simulated toilet transfer to drop arm recliner. Pt left with Maxi Move lift pad to assist with transfer back in to bed. Pt would benefit from continued OT to address weakness in functional transfer, education of AE, and compensatory techniques to maximize independence and safety to transition to next venue. OT will continue to follow acutely.   Follow Up Recommendations  CIR;Supervision/Assistance - 24 hour    Equipment Recommendations  3 in 1 bedside commode    Recommendations for Other Services      Precautions / Restrictions Precautions Precautions: Knee;Fall Required Braces or Orthoses: Knee Immobilizer - Right;Knee Immobilizer - Left(Hinge knee braces) Restrictions Weight Bearing Restrictions: Yes RLE Weight Bearing: Non weight bearing LLE Weight Bearing: Weight bearing as tolerated Other Position/Activity Restrictions: can pivot on left knee. No weight on the right        Mobility Bed Mobility Overal bed mobility: Needs Assistance Bed Mobility: Rolling;Sidelying to Sit Rolling: Max assist;+2 for physical assistance Sidelying to sit: Max assist;+2 for physical assistance;HOB elevated Supine to sit: +2 for physical assistance;+2 for safety/equipment;Max assist     General bed mobility comments: Max a to control legs in and out of bed. Max a to scoot to the edge of the bed. VCs to use BUE to assist with  pushing to seated position at EOB.   Transfers Overall transfer level: Needs assistance   Transfers: Sit to/from Stand Sit to Stand: Total assist         General transfer comment: OT/PT attempted to stand patient 2 times. She wzas ubnable to stand without putting weight into the right LE. She was unable to come to a full standing position,. Pt placed in recliner with lift pads for transfer back into bed due to decreased strength.    Balance Overall balance assessment: Needs assistance Sitting-balance support: Bilateral upper extremity supported Sitting balance-Leahy Scale: Poor     Standing balance support: Bilateral upper extremity supported Standing balance-Leahy Scale: Zero                             ADL either performed or assessed with clinical judgement   ADL Overall ADL's : Needs assistance/impaired                         Toilet Transfer: Total assistance;+2 for physical assistance;+2 for safety/equipment;Cueing for sequencing(Scoot transfer to drop arm recliner from L side. ) Toilet Transfer Details (indicate cue type and reason): Pt required total A due to generalized weakness of BUE and inability to not put weight on RLE.  Toileting- Clothing Manipulation and Hygiene: Maximal assistance;Sitting/lateral lean;Bed level Toileting - Clothing Manipulation Details (indicate cue type and reason): Max A hygiene required assist to roll from side to side to clean after using bed pan.     Functional mobility during ADLs: +2 for physical assistance;+2  for safety/equipment;Total assistance;Cueing for safety;Cueing for sequencing General ADL Comments: Simulated toilet transfer from bed to drop recliner, pt demonstrated decreased strength to assist with transfer.     Vision   Vision Assessment?: Vision impaired- to be further tested in functional context   Perception     Praxis      Cognition Arousal/Alertness: Awake/alert Behavior During Therapy:  Restless Overall Cognitive Status: Within Functional Limits for tasks assessed                                 General Comments: Pt appeared fatigue at the start of session.         Exercises     Shoulder Instructions       General Comments      Pertinent Vitals/ Pain       Pain Assessment: 0-10 Pain Score: 8  Pain Location: bilateral legs Pain Descriptors / Indicators: Burning;Jabbing Pain Intervention(s): Limited activity within patient's tolerance;Monitored during session;Repositioned;Patient requesting pain meds-RN notified  Home Living                                          Prior Functioning/Environment              Frequency  Min 2X/week        Progress Toward Goals  OT Goals(current goals can now be found in the care plan section)  Progress towards OT goals: Not progressing toward goals - comment  Acute Rehab OT Goals Patient Stated Goal: to get stronger to go home OT Goal Formulation: With patient Time For Goal Achievement: 01/07/19 Potential to Achieve Goals: Good ADL Goals Pt Will Perform Grooming: with set-up;sitting Pt Will Perform Lower Body Dressing: with mod assist;sitting/lateral leans;bed level Pt Will Transfer to Toilet: with min assist;with +2 assist;bedside commode Additional ADL Goal #1: Pt will perform transfers with modA +2 with sliding board  Plan Discharge plan remains appropriate;Frequency remains appropriate    Co-evaluation    PT/OT/SLP Co-Evaluation/Treatment: Yes Reason for Co-Treatment: Complexity of the patient's impairments (multi-system involvement);For patient/therapist safety   OT goals addressed during session: ADL's and self-care;Proper use of Adaptive equipment and DME;Strengthening/ROM      AM-PAC OT "6 Clicks" Daily Activity     Outcome Measure   Help from another person eating meals?: None Help from another person taking care of personal grooming?: A Little Help from  another person toileting, which includes using toliet, bedpan, or urinal?: Total Help from another person bathing (including washing, rinsing, drying)?: A Lot Help from another person to put on and taking off regular upper body clothing?: A Little Help from another person to put on and taking off regular lower body clothing?: Total 6 Click Score: 14    End of Session Equipment Utilized During Treatment: Gait belt;Rolling walker;Right knee immobilizer;Left knee immobilizer  OT Visit Diagnosis: Unsteadiness on feet (R26.81);Muscle weakness (generalized) (M62.81)   Activity Tolerance Patient limited by pain;Treatment limited secondary to medical complications (Comment)   Patient Left with call bell/phone within reach;with bed alarm set;in chair   Nurse Communication Mobility status        Time: 2353-6144 OT Time Calculation (min): 32 min  Charges: OT General Charges $OT Visit: 1 Visit OT Treatments $Self Care/Home Management : 8-22 mins  Minus Breeding, Onset  [phone]  Marius Ditch 12/26/2018, 5:02 PM

## 2018-12-26 NOTE — Progress Notes (Signed)
Orthopaedic Trauma Progress Note  S: Patient doing okay today. Pain has been well controlled on current regimen. Was able to begin working with therapies yesterday. Continues to wear bilateral hinge knee braces.  O:  Vitals:   12/26/18 0912 12/26/18 1327  BP:  (!) 141/73  Pulse: 89 88  Resp: 16 18  Temp:  98.4 F (36.9 C)  SpO2: 94% 94%   General: Sitting up in bed. Alert and oriented x3, NAD  RLE: Hinge knee brace and dressing in place. Minimal swelling noted in foot. Mild tenderness to palpation of knee and lower leg. Able to wiggle toes. Sensation intact. Neurovascularly intact with brisk cap refill.   LLE: Hinge knee brace and dressing in place. Minimal swelling noted in foot. Tenderness to palpation of knee. Able to wiggle toes. Sensation intact. Neurovascularly intact with brisk cap refill.  Imaging: stable post op imaging  Labs:  Results for orders placed or performed during the hospital encounter of 12/21/18 (from the past 24 hour(s))  CBC     Status: Abnormal   Collection Time: 12/26/18  3:42 AM  Result Value Ref Range   WBC 10.7 (H) 4.0 - 10.5 K/uL   RBC 3.30 (L) 3.87 - 5.11 MIL/uL   Hemoglobin 9.3 (L) 12.0 - 15.0 g/dL   HCT 31.7 (L) 36.0 - 46.0 %   MCV 96.1 80.0 - 100.0 fL   MCH 28.2 26.0 - 34.0 pg   MCHC 29.3 (L) 30.0 - 36.0 g/dL   RDW 17.8 (H) 11.5 - 15.5 %   Platelets 159 150 - 400 K/uL   nRBC 0.2 0.0 - 0.2 %  Comprehensive metabolic panel     Status: Abnormal   Collection Time: 12/26/18  3:42 AM  Result Value Ref Range   Sodium 131 (L) 135 - 145 mmol/L   Potassium 4.9 3.5 - 5.1 mmol/L   Chloride 98 98 - 111 mmol/L   CO2 20 (L) 22 - 32 mmol/L   Glucose, Bld 121 (H) 70 - 99 mg/dL   BUN 48 (H) 8 - 23 mg/dL   Creatinine, Ser 1.32 (H) 0.44 - 1.00 mg/dL   Calcium 9.1 8.9 - 10.3 mg/dL   Total Protein 5.8 (L) 6.5 - 8.1 g/dL   Albumin 2.2 (L) 3.5 - 5.0 g/dL   AST 41 15 - 41 U/L   ALT 23 0 - 44 U/L   Alkaline Phosphatase 74 38 - 126 U/L   Total Bilirubin 2.2  (H) 0.3 - 1.2 mg/dL   GFR calc non Af Amer 43 (L) >60 mL/min   GFR calc Af Amer 50 (L) >60 mL/min   Anion gap 13 5 - 15  Magnesium     Status: None   Collection Time: 12/26/18  3:42 AM  Result Value Ref Range   Magnesium 2.3 1.7 - 2.4 mg/dL    Assessment: 63 year old female s/p fall down steps  Injuries: 1. Right Schatzker II tibial plateau fracture s/p ORIF on 12/23/18 2. Right lateral malleolus fracture s/p stress exam under anesthesia and non-operative treatment  3. Left proximal tibial shaft fracture s/p ORIF on 12/23/18  Weightbearing: NWB RLE, Weightbearing LLE for transfers only  Insicional and dressing care: bilateral lower extremity dressings clean, dry, intact. Will change tomorrow.    Orthopedic device(s): bilateral hinge knee braces  CV/Blood loss: Hgb 9.3 today.  Hemodynamically stable  Pain management: 1. Robaxin 500 mg q 6 hours PRN 2.Morphine 2mg  q 2 hours PRN 3. Norco 5/325 , 1-2 tablets  q 4 hours  VTE prophylaxis: Lovenox 40 mg daily  ID: Ancef 2gm postoperatively completed  Foley/Lines: no foley, KVO IVFs  Medical co-morbidities: Asthma, CHF, ESRD, Hyperlipidemia, Hyperparathyroidism, Depression, Polycystic Kidney   Dispo: PT/OT eval, dispo pending  Follow - up plan: TBD   Jori Thrall A. [facility] ?[phone]? (phone)

## 2018-12-26 NOTE — Progress Notes (Signed)
Inpatient Rehabilitation-Admissions Coordinator   Met with pt at the bedside to discuss CIR program. [facility] provided pt and her daughter with booklets and handouts regarding program. Pt's daughter states she is an OT and helped progress discussion towards dispo planning. Per daughter, the pt will not have 24/7 A at DC but could have supervision. Based on current therapy notes, doubt the pt would be able to achieve a supervision level of assistance after short term rehab. After speaking with PTA via phone, pt may benefit from premedicating prior to next therapy session. Asked therapies to see her early tomorrow morning, with hopes that pt will be able to participate to a greater extent. If no progress, pt would require SNF placement.  AC will follow up tomorrow.   Jhonnie Garner, OTR/L  Rehab Admissions Coordinator  [phone] 12/26/2018 5:29 PM

## 2018-12-26 NOTE — Progress Notes (Signed)
PROGRESS NOTE    ADANYA SOSINSKI  BMW:[id number] DOB: August 24, 1956 DOA: 12/21/2018 PCP: [name], MD   Brief Narrative:  April [name] is a 63 y.o. female with medical history significant of this post renal transplant (h/o polycystic kidney disease), chronic kidney disease stage III, asthma, chronic diastolic CHF who presented after a mechanical fall found to have bilateral knee fractures.   Assessment & Plan:   Active Problems:   Asthma, mild intermittent   Chronic diastolic CHF (congestive heart failure) (HCC)   Bilateral closed proximal tibial fracture   Closed right ankle fracture   Fracture   Right Lateral Tibial Plateau Fracture Left Comminuted Displaced Fracture of Proximal Tibial Metaphysis Comminuted Displaced Proximal Fibular Diaphyseal Fracture Non displaced Fracture of Distal Right Fibula S/p ORIF R lateral tibial plateau fx, ORIF L proximal tibial fracture, stress examination of R ankle on 1/31 by ortho Plan for nonweightbearing (can stand pivot transfer to LLE, but NWB to RLE) Lovenox for DVT ppx - ortho recommending 30 days Norco, morphine prn pain control.  Bowel regimen.  Follow vitamin D level (low, start supplementation) PT/OT recommending CIR   Acute Encephalopathy: A&Ox2-3 (knew month, but not day or date).  Daughter has noted some mild confusion, intermittent over past few days.  Suspect this is delirium related to her acute hospitalization, pain, surgery. No focal neuro deficits on exam. Follow UA Delirium precautions Will continue to monitor, additional w/u as needed  Acute Kidney Injury on CKD  Hyponatremia  Hx Renal Transplant  Hx PCKD  CKD I don't see any obviously nephrotoxic meds on mar other than vancomycin No obvious hypotension noted.  Possibly poor PO intake after surgery? UA bland.  Urine sodium low, suggestive of pre renal etiology.  She does not appear grossly overloaded. Improved today with IVF, continue to monitor.  Follow  CXR (stable scarring L base, no acute cardiopulmonary findings) Renal US (with renal transplant in R pelvis without adverse features) Renal c/s given lack of improvement, appreciate recs - suspects mild ATN Pt also hyponatremic, continue IVF Continue prednisone, tacrolimus, and mycophenolate  Hyperglycemia: A1c 5.4, continue to monitor BG  Asthma  O2 Requirement:  No evidence of exacerbation Improved, on RA today  HFpEF: Per previous provider, pt with grade 1 diastolic dysfunction.  I don't see echo in chart.  In care everywhere, she has unremarkable echo from 2013.    Nausea: possibly related to pain meds above (oxy causes nausea for her).  Prn antiemetics.  Leukocytosis: improved, persistent.  Continue to monitor.  Afebrile.   Elevated LFT's: mild, follow  Concern for OSA and hoarseness: pt concerned she may have OSA, also concerned about hoarseness.  Discussed some hoarseness may be from recent surgery/intubation.  Will recommend follow up outpatient for sleep study.  Follow to ensure hoarseness resolves as well.  DVT prophylaxis: lovenox Code Status: full  Family Communication: none at bedside Disposition Plan: pending CIR  Consultants:   orthopedics  Procedures:   none  Antimicrobials:  Anti-infectives (From admission, onward)   Start     Dose/Rate Route Frequency Ordered Stop   12/23/18 1400  ceFAZolin (ANCEF) IVPB 2g/100 mL premix     2 g 200 mL/hr over 30 Minutes Intravenous Every 8 hours 12/23/18 1212 12/24/18 0612   12/23/18 0853  vancomycin (VANCOCIN) powder  Status:  Discontinued       As needed 12/23/18 0854 12/23/18 1008   12/23/18 0615  ceFAZolin (ANCEF) IVPB 2g/100 mL premix     2  g 200 mL/hr over 30 Minutes Intravenous On call to O.R. 12/22/18 1147 12/23/18 0800     Subjective: Still c/o persistent pain  Objective: Vitals:   12/25/18 1942 12/25/18 2057 12/26/18 0912 12/26/18 1327  BP:  139/73  (!) 141/73  Pulse:  89 89 88  Resp:   16 18    Temp:    98.4 F (36.9 C)  TempSrc:    Oral  SpO2: 94% 92% 94% 94%  Weight:      Height:        Intake/Output Summary (Last 24 hours) at 12/26/2018 1417 Last data filed at 12/26/2018 1114 Gross per 24 hour  Intake 1656.76 ml  Output 1350 ml  Net 306.76 ml   Filed Weights   12/22/18 0139  Weight: 82.7 kg    Examination:  General: No acute distress. Cardiovascular: Heart sounds show a regular rate, and rhythm. Lungs: Clear to auscultation bilaterally Abdomen: Soft, nontender, nondistended  Neurological: Alert and oriented 3. Moves all extremities 4. Cranial nerves II through XII intact. Skin: Warm and dry. No rashes or lesions. Extremities: bilateral LE in dressing/brace Psychiatric: Mood and affect are normal. Insight and judgment are appropraite.   Data Reviewed: I have personally reviewed following labs and imaging studies  CBC: Recent Labs  Lab 12/21/18 1959 12/22/18 0209 12/23/18 0244 12/24/18 0655 12/25/18 0309 12/26/18 0342  WBC 9.1 14.5* 20.2* 22.1* 19.5* 10.7*  NEUTROABS 8.1*  --   --   --   --   --   HGB 11.5* 11.8* 11.0* 10.0* 9.6* 9.3*  HCT 40.0 40.2 36.2 34.0* 31.7* 31.7*  MCV 100.3* 96.4 95.5 96.6 95.5 96.1  PLT 168 191 181 201 210 956   Basic Metabolic Panel: Recent Labs  Lab 12/23/18 0244 12/24/18 0655 12/24/18 1558 12/25/18 0309 12/26/18 0342  NA 133* 132* 129* 129* 131*  K 4.9 5.5* 5.2* 5.1 4.9  CL 100 98 96* 97* 98  CO2 22 22 21* 21* 20*  GLUCOSE 141* 148* 142* 139* 121*  BUN 27* 39* 41* 47* 48*  CREATININE 1.27* 1.83* 1.67* 1.74* 1.32*  CALCIUM 9.1 9.1 8.7* 9.0 9.1  MG 1.9 2.0  --  2.2 2.3   GFR: Estimated Creatinine Clearance: 43.9 mL/min (A) (by C-G formula based on SCr of 1.32 mg/dL (H)). Liver Function Tests: Recent Labs  Lab 12/21/18 1959 12/23/18 0244 12/25/18 0309 12/26/18 0342  AST 22 17 49* 41  ALT 15 12 26 23   ALKPHOS 67 66 70 74  BILITOT 1.1 2.6* 2.5* 2.2*  PROT 7.3 7.3 6.7 5.8*  ALBUMIN 3.4* 2.8* 2.3* 2.2*    No results for input(s): LIPASE, AMYLASE in the last 168 hours. No results for input(s): AMMONIA in the last 168 hours. Coagulation Profile: No results for input(s): INR, PROTIME in the last 168 hours. Cardiac Enzymes: Recent Labs  Lab 12/25/18 0309  CKTOTAL 128   BNP (last 3 results) No results for input(s): PROBNP in the last 8760 hours. HbA1C: Recent Labs    12/24/18 0655  HGBA1C 5.4   CBG: No results for input(s): GLUCAP in the last 168 hours. Lipid Profile: No results for input(s): CHOL, HDL, LDLCALC, TRIG, CHOLHDL, LDLDIRECT in the last 72 hours. Thyroid Function Tests: No results for input(s): TSH, T4TOTAL, FREET4, T3FREE, THYROIDAB in the last 72 hours. Anemia Panel: No results for input(s): VITAMINB12, FOLATE, FERRITIN, TIBC, IRON, RETICCTPCT in the last 72 hours. Sepsis Labs: No results for input(s): PROCALCITON, LATICACIDVEN in the last 168 hours.  Recent  Results (from the past 240 hour(s))  Surgical pcr screen     Status: None   Collection Time: 12/22/18  1:29 AM  Result Value Ref Range Status   MRSA, PCR NEGATIVE NEGATIVE Final   Staphylococcus aureus NEGATIVE NEGATIVE Final    Comment: (NOTE) The Xpert SA Assay (FDA approved for NASAL specimens in patients 68 years of age and older), is one component of a comprehensive surveillance program. It is not intended to diagnose infection nor to guide or monitor treatment. Performed at [facility], [place] [address] [place], [place] [address]          Radiology Studies: US Renal  Result Date: 12/25/2018 CLINICAL DATA:  63 year old female with acute kidney injury. Polycystic renal disease, renal transplant. EXAM: RENAL / URINARY TRACT ULTRASOUND COMPLETE COMPARISON:  CT Abdomen and Pelvis 05/15/2013. FINDINGS: Right Kidney: Renal measurements: 13.6 x 6.5 x 7.5 centimeters = volume: 345 mL. Virtually no normal right renal parenchyma is identified. Numerous renal cysts, more than 10 individually up to  6.1 centimeters diameter. No right hydronephrosis suspected. Left Kidney: Renal measurements: 15.4 x 8.8 x 9.3 centimeters = volume: 659 mL. Virtually no normal left renal parenchyma, with more than 10 left renal cysts individually up to 6.6 centimeters diameter. No left hydronephrosis suspected. Bladder: Appears normal for degree of bladder distention. Other findings: Right pelvis renal transplant with normal cortical echogenicity, no transplant hydronephrosis. The transplant kidney measures 10.6 x 6.3 x 4.8 centimeters (volume 168 milliliters). IMPRESSION: 1. Renal transplant in the right pelvis with no adverse features. 2. Native polycystic kidney disease with no normal native renal parenchyma identified. 3. Unremarkable urinary bladder. Electronically Signed   By: Genevie Ann M.D.   On: 12/25/2018 16:25   Dg Chest Port 1 View  Result Date: 12/25/2018 CLINICAL DATA:  Hypoxia. EXAM: PORTABLE CHEST 1 VIEW COMPARISON:  12/21/2018 FINDINGS: 1232 hours. The cardio pericardial silhouette is enlarged. There is pulmonary vascular congestion without overt pulmonary edema. Interstitial markings are diffusely coarsened with chronic features. Scarring at the left base is stable in the interval. The visualized bony structures of the thorax are intact. IMPRESSION: Stable scarring left base.  No acute cardiopulmonary findings. Electronically Signed   By: Misty Stanley M.D.   On: 12/25/2018 15:06        Scheduled Meds: . brimonidine  1 drop Left Eye BID  . cholecalciferol  1,000 Units Oral Daily  . cycloSPORINE  1 drop Both Eyes BID  . enoxaparin (LOVENOX) injection  40 mg Subcutaneous Q24H  . latanoprost  1 drop Both Eyes QHS  . mometasone-formoterol  2 puff Inhalation BID  . mycophenolate  180 mg Oral BID  . polyethylene glycol  17 g Oral BID  . prednisoLONE acetate  1 drop Left Eye QID  . prednisoLONE acetate  1 drop Right Eye Daily  . predniSONE  2.5 mg Oral Q supper  . predniSONE  5 mg Oral Q breakfast  .  senna  2 tablet Oral QHS  . tacrolimus  0.5 mg Oral QHS  . tacrolimus  1 mg Oral Daily   Continuous Infusions: . methocarbamol (ROBAXIN) IV       LOS: 5 days    Time spent: over 30 min    Fayrene Helper, MD [facility] Pager AMION  If 7PM-7AM, please contact night-coverage [email or web address] Password [email or web address] 12/26/2018, 2:17 PM

## 2018-12-27 ENCOUNTER — Inpatient Hospital Stay (HOSPITAL_COMMUNITY): Payer: Medicare Other

## 2018-12-27 ENCOUNTER — Encounter (HOSPITAL_COMMUNITY): Payer: Self-pay | Admitting: Student

## 2018-12-27 LAB — BLOOD GAS, VENOUS
Acid-Base Excess: 1 mmol/L (ref 0.0–2.0)
Bicarbonate: 24.5 mmol/L (ref 20.0–28.0)
O2 Saturation: 97.9 %
PATIENT TEMPERATURE: 98.6
pCO2, Ven: 34.8 mmHg — ABNORMAL LOW (ref 44.0–60.0)
pH, Ven: 7.461 — ABNORMAL HIGH (ref 7.250–7.430)
pO2, Ven: 98.3 mmHg — ABNORMAL HIGH (ref 32.0–45.0)

## 2018-12-27 LAB — TSH: TSH: 1.046 u[IU]/mL (ref 0.350–4.500)

## 2018-12-27 LAB — CBC
HCT: 30.6 % — ABNORMAL LOW (ref 36.0–46.0)
Hemoglobin: 9.3 g/dL — ABNORMAL LOW (ref 12.0–15.0)
MCH: 29 pg (ref 26.0–34.0)
MCHC: 30.4 g/dL (ref 30.0–36.0)
MCV: 95.3 fL (ref 80.0–100.0)
Platelets: 144 10*3/uL — ABNORMAL LOW (ref 150–400)
RBC: 3.21 MIL/uL — ABNORMAL LOW (ref 3.87–5.11)
RDW: 17.7 % — ABNORMAL HIGH (ref 11.5–15.5)
WBC: 9.7 10*3/uL (ref 4.0–10.5)
nRBC: 0.3 % — ABNORMAL HIGH (ref 0.0–0.2)

## 2018-12-27 LAB — VITAMIN B12: Vitamin B-12: 1045 pg/mL — ABNORMAL HIGH (ref 180–914)

## 2018-12-27 LAB — BASIC METABOLIC PANEL
Anion gap: 9 (ref 5–15)
BUN: 44 mg/dL — ABNORMAL HIGH (ref 8–23)
CO2: 24 mmol/L (ref 22–32)
Calcium: 9 mg/dL (ref 8.9–10.3)
Chloride: 99 mmol/L (ref 98–111)
Creatinine, Ser: 1.22 mg/dL — ABNORMAL HIGH (ref 0.44–1.00)
GFR calc Af Amer: 55 mL/min — ABNORMAL LOW (ref >60)
GFR, EST NON AFRICAN AMERICAN: 48 mL/min — AB (ref >60)
Glucose, Bld: 133 mg/dL — ABNORMAL HIGH (ref 70–99)
POTASSIUM: 4.5 mmol/L (ref 3.5–5.1)
Sodium: 132 mmol/L — ABNORMAL LOW (ref 135–145)

## 2018-12-27 LAB — MAGNESIUM: Magnesium: 2.1 mg/dL (ref 1.7–2.4)

## 2018-12-27 LAB — FOLATE: Folate: 10.1 ng/mL (ref >5.9)

## 2018-12-27 LAB — AMMONIA: Ammonia: 23 umol/L (ref 9–35)

## 2018-12-27 MED ORDER — ACETAMINOPHEN 500 MG PO TABS
500.0000 mg | ORAL_TABLET | Freq: Four times a day (QID) | ORAL | Status: DC
  Administered 2018-12-27 – 2018-12-29 (×8): 500 mg via ORAL
  Filled 2018-12-27 (×8): qty 1

## 2018-12-27 NOTE — Care Management Important Message (Signed)
Important Message  Patient Details  Name: April [name] MRN: [id number] Date of Birth: [birth year]   Medicare Important Message Given:  Yes    Ivon Roedel 12/27/2018, 8:25 AM

## 2018-12-27 NOTE — Progress Notes (Signed)
Inpatient Rehabilitation-Admissions Coordinator   After conversation with PTA this AM. Feel pt is most appropriate for SNF placement as pt unable to tolerate the intensity of the CIR program at this time. AC will contact CM/SW and sign off.   Please call if questions.   Jhonnie Garner, OTR/L  Rehab Admissions Coordinator  [phone] 12/27/2018 9:57 AM

## 2018-12-27 NOTE — Progress Notes (Addendum)
Orthopaedic Trauma Progress Note  S: Patient doing okay this morning, mildly confused during exam. States she feels kind of groggy this morning. Pain has been well controlled on current regimen. Continuing to work with therapies, will likely go to CIR today.  O:  Vitals:   12/26/18 2050 12/27/18 0453  BP:  130/71  Pulse:  95  Resp:  16  Temp:  98.8 F (37.1 C)  SpO2: 99% 90%   General: Laying in bed, No acute distress  RLE: Hinge knee brace in place. Dressing changed. Incisions healing well, are clean/dry/intact. Minimal swelling noted in foot. Mild tenderness to palpation of knee and lower leg. Able to wiggle toes. Sensation intact. Neurovascularly intact with brisk cap refill.   LLE: Hinge knee brace in place. Dressing removed and changed. Incisions healing well, are clean/dry/intact. Some swelling in ankle and foot. Significant bruising over anterior tibia. Tenderness to palpation of lower leg over fracture site. Compartments soft and compressible. Able to wiggle toes. Sensation intact. Neurovascularly intact with brisk cap refill.  Imaging: stable post op imaging  Labs:  Results for orders placed or performed during the hospital encounter of 12/21/18 (from the past 24 hour(s))  Urinalysis, Routine w reflex microscopic     Status: Abnormal   Collection Time: 12/26/18  3:31 PM  Result Value Ref Range   Color, Urine YELLOW YELLOW   APPearance CLEAR CLEAR   Specific Gravity, Urine 1.008 1.005 - 1.030   pH 6.0 5.0 - 8.0   Glucose, UA NEGATIVE NEGATIVE mg/dL   Hgb urine dipstick SMALL (A) NEGATIVE   Bilirubin Urine NEGATIVE NEGATIVE   Ketones, ur NEGATIVE NEGATIVE mg/dL   Protein, ur NEGATIVE NEGATIVE mg/dL   Nitrite NEGATIVE NEGATIVE   Leukocytes, UA NEGATIVE NEGATIVE   RBC / HPF 0-5 0 - 5 RBC/hpf   WBC, UA 0-5 0 - 5 WBC/hpf   Bacteria, UA NONE SEEN NONE SEEN   Squamous Epithelial / LPF 0-5 0 - 5  CBC     Status: Abnormal   Collection Time: 12/27/18  3:43 AM  Result Value  Ref Range   WBC 9.7 4.0 - 10.5 K/uL   RBC 3.21 (L) 3.87 - 5.11 MIL/uL   Hemoglobin 9.3 (L) 12.0 - 15.0 g/dL   HCT 30.6 (L) 36.0 - 46.0 %   MCV 95.3 80.0 - 100.0 fL   MCH 29.0 26.0 - 34.0 pg   MCHC 30.4 30.0 - 36.0 g/dL   RDW 17.7 (H) 11.5 - 15.5 %   Platelets 144 (L) 150 - 400 K/uL   nRBC 0.3 (H) 0.0 - 0.2 %  Basic metabolic panel     Status: Abnormal   Collection Time: 12/27/18  3:43 AM  Result Value Ref Range   Sodium 132 (L) 135 - 145 mmol/L   Potassium 4.5 3.5 - 5.1 mmol/L   Chloride 99 98 - 111 mmol/L   CO2 24 22 - 32 mmol/L   Glucose, Bld 133 (H) 70 - 99 mg/dL   BUN 44 (H) 8 - 23 mg/dL   Creatinine, Ser 1.22 (H) 0.44 - 1.00 mg/dL   Calcium 9.0 8.9 - 10.3 mg/dL   GFR calc non Af Amer 48 (L) >60 mL/min   GFR calc Af Amer 55 (L) >60 mL/min   Anion gap 9 5 - 15  Magnesium     Status: None   Collection Time: 12/27/18  3:43 AM  Result Value Ref Range   Magnesium 2.1 1.7 - 2.4 mg/dL  Assessment: 63 year old female s/p fall down steps  Injuries: 1. Right Schatzker II tibial plateau fracture s/p ORIF on 12/23/18 2. Right lateral malleolus fracture s/p stress exam under anesthesia and non-operative treatment  3. Left proximal tibial shaft fracture s/p ORIF on 12/23/18  Weightbearing: NWB RLE, Weightbearing LLE for transfers only  Insicional and dressing care: bilateral lower extremity dressings chamged. Can be changed as needed  Orthopedic device(s): bilateral hinge knee braces  CV/Blood loss: Hgb 9.3 today.  Hemodynamically stable  Pain management: 1. Robaxin 500 mg q 6 hours PRN 2. Morphine 2mg  q 2 hours PRN 3. Norco 5/325 , 1-2 tablets q 4 hours PRN 4. Tylenol 650 mg q 6 hours PRN  VTE prophylaxis: Lovenox 40 mg daily  ID: Ancef 2gm postoperatively completed  Foley/Lines: no foley, KVO IVFs  Medical co-morbidities: Asthma, CHF, ESRD, Hyperlipidemia, Hyperparathyroidism, Depression, Polycystic Kidney   Dispo: PT/OT eval, recommending CIR. Will likely be moved to  CIR today, okay from ortho perspective  Follow - up plan: Will continue to follow while patient is in hospital. Will follow up as outpatient after discharge   Felicha Frayne A. [facility] ?[phone]? (phone)

## 2018-12-27 NOTE — Progress Notes (Signed)
CSW spoke with patient husband who reports he would like a SNF close to the hospital and reports preference of [facility]. CSW sent initial referral and will request they initiate insurance auth.   [place], [place]

## 2018-12-27 NOTE — Progress Notes (Addendum)
PROGRESS NOTE    April [name]  DQQ:[id number] DOB: [birth year] DOA: 12/21/2018 PCP: [name], MD   Brief Narrative:  April [name] is a 63 y.o. female with medical history significant of this post renal transplant (h/o polycystic kidney disease), chronic kidney disease stage III, asthma, chronic diastolic CHF who presented after a mechanical fall found to have bilateral knee fractures.   Assessment & Plan:   Active Problems:   Asthma, mild intermittent   Chronic diastolic CHF (congestive heart failure) (HCC)   Bilateral closed proximal tibial fracture   Closed right ankle fracture   Fracture   Right Lateral Tibial Plateau Fracture Left Comminuted Displaced Fracture of Proximal Tibial Metaphysis Comminuted Displaced Proximal Fibular Diaphyseal Fracture Non displaced Fracture of Distal Right Fibula S/p ORIF R lateral tibial plateau fx, ORIF L proximal tibial fracture, stress examination of R ankle on 1/31 by ortho Plan for nonweightbearing (can stand pivot transfer to LLE, but NWB to RLE) Lovenox for DVT ppx - ortho recommending 30 days Norco, morphine prn pain control.  Bowel regimen.  Will schedule APAP, watch total daily APAP dose.  Follow vitamin D level (low, start supplementation) PT/OT recommending CIR   Acute Encephalopathy: A&Ox2 (intermittently confused, said [facility] briefly today, but able to correct).  Daughter has noted some mild confusion, intermittent over past few days.  Suspect this is delirium related to her acute hospitalization, pain, surgery.  No focal deficits appreciated on exam.  Today she seems more confused, mumbling incoherently at times.  This is interfering with her ability to participate with therapy  Follow UA (not suggestive of infection) Follow VBG (without evidence hypercarbia), B12 (elevated), folate (wnl), ammonia (wnl) Will obtain head CT today given persistent delirium, which has not significantly improved Delirium precautions Will  continue to monitor, additional w/u as needed  Acute Kidney Injury on CKD  Hyponatremia  Hx Renal Transplant  Hx PCKD  CKD I don't see any obviously nephrotoxic meds on mar other than vancomycin No obvious hypotension noted.  Possibly poor PO intake after surgery? UA bland.  Urine sodium low, suggestive of pre renal etiology.  She does not appear grossly overloaded. Continues to improve today with IVF, continue to monitor.  Follow CXR (stable scarring L base, no acute cardiopulmonary findings) Renal US (with renal transplant in R pelvis without adverse features) Renal c/s given lack of improvement, appreciate recs - suspects mild ATN Pt also hyponatremic, continue IVF Continue prednisone, tacrolimus, and mycophenolate  Hyperglycemia: A1c 5.4, continue to monitor BG  Asthma  O2 Requirement:  No evidence of exacerbation Improved, intermittently on and off O2, I think this will continue to improve post op Continue IS, flutter W/u additionally if persistent or worsening  HFpEF: Per previous provider, pt with grade 1 diastolic dysfunction.  I don't see echo in chart.  In care everywhere, she has unremarkable echo from 2013.    Nausea: possibly related to pain meds above (oxy causes nausea for her).  Prn antiemetics.  Leukocytosis: improved, persistent.  Continue to monitor.  Afebrile.   Elevated LFT's: mild, follow  Concern for OSA and hoarseness: pt concerned she may have OSA, also concerned about hoarseness.  Discussed some hoarseness may be from recent surgery/intubation.  Will recommend follow up outpatient for sleep study.  Follow to ensure hoarseness resolves as well.  DVT prophylaxis: lovenox Code Status: full  Family Communication: none at bedside Disposition Plan: pending SNF, improvement in delirium  Consultants:   orthopedics  Procedures:   none  Antimicrobials:  Anti-infectives (From admission, onward)   Start     Dose/Rate Route Frequency Ordered Stop    12/23/18 1400  ceFAZolin (ANCEF) IVPB 2g/100 mL premix     2 g 200 mL/hr over 30 Minutes Intravenous Every 8 hours 12/23/18 1212 12/24/18 0612   12/23/18 0853  vancomycin (VANCOCIN) powder  Status:  Discontinued       As needed 12/23/18 0854 12/23/18 1008   12/23/18 0615  ceFAZolin (ANCEF) IVPB 2g/100 mL premix     2 g 200 mL/hr over 30 Minutes Intravenous On call to O.R. 12/22/18 1147 12/23/18 0800     Subjective: Pain improved A&Ox~2 Confused.  Mumbles incoherently at time.  Inattention.  Objective: Vitals:   12/27/18 0453 12/27/18 0500 12/27/18 0934 12/27/18 1548  BP: 130/71   127/62  Pulse: 95   87  Resp: 16   18  Temp: 98.8 F (37.1 C)     TempSrc: Oral     SpO2: 90%  92% 97%  Weight:  92.5 kg    Height:        Intake/Output Summary (Last 24 hours) at 12/27/2018 1622 Last data filed at 12/27/2018 1300 Gross per 24 hour  Intake 840 ml  Output 1400 ml  Net -560 ml   Filed Weights   12/22/18 0139 12/27/18 0500  Weight: 82.7 kg 92.5 kg    Examination:  General: No acute distress. Cardiovascular: Heart sounds show a regular rate, and rhythm. Lungs: Clear to auscultation bilaterally Abdomen: Soft, nontender, nondistended. Neurological: Alert and oriented 2. Moves all extremities 4. Cranial nerves II through XII intact. Skin: Warm and dry. No rashes or lesions. Extremities: bilateral LE in dressing/braces Psychiatric: Mood and affect are normal. Insight and judgment are impaired.  Data Reviewed: I have personally reviewed following labs and imaging studies  CBC: Recent Labs  Lab 12/21/18 1959  12/23/18 0244 12/24/18 0655 12/25/18 0309 12/26/18 0342 12/27/18 0343  WBC 9.1   < > 20.2* 22.1* 19.5* 10.7* 9.7  NEUTROABS 8.1*  --   --   --   --   --   --   HGB 11.5*   < > 11.0* 10.0* 9.6* 9.3* 9.3*  HCT 40.0   < > 36.2 34.0* 31.7* 31.7* 30.6*  MCV 100.3*   < > 95.5 96.6 95.5 96.1 95.3  PLT 168   < > 181 201 210 159 144*   < > = values in this interval not  displayed.   Basic Metabolic Panel: Recent Labs  Lab 12/23/18 0244 12/24/18 0655 12/24/18 1558 12/25/18 0309 12/26/18 0342 12/27/18 0343  NA 133* 132* 129* 129* 131* 132*  K 4.9 5.5* 5.2* 5.1 4.9 4.5  CL 100 98 96* 97* 98 99  CO2 22 22 21* 21* 20* 24  GLUCOSE 141* 148* 142* 139* 121* 133*  BUN 27* 39* 41* 47* 48* 44*  CREATININE 1.27* 1.83* 1.67* 1.74* 1.32* 1.22*  CALCIUM 9.1 9.1 8.7* 9.0 9.1 9.0  MG 1.9 2.0  --  2.2 2.3 2.1   GFR: Estimated Creatinine Clearance: 50.5 mL/min (A) (by C-G formula based on SCr of 1.22 mg/dL (H)). Liver Function Tests: Recent Labs  Lab 12/21/18 1959 12/23/18 0244 12/25/18 0309 12/26/18 0342  AST 22 17 49* 41  ALT 15 12 26 23   ALKPHOS 67 66 70 74  BILITOT 1.1 2.6* 2.5* 2.2*  PROT 7.3 7.3 6.7 5.8*  ALBUMIN 3.4* 2.8* 2.3* 2.2*   No results for input(s): LIPASE, AMYLASE in the last  168 hours. Recent Labs  Lab 12/27/18 1029  AMMONIA 23   Coagulation Profile: No results for input(s): INR, PROTIME in the last 168 hours. Cardiac Enzymes: Recent Labs  Lab 12/25/18 0309  CKTOTAL 128   BNP (last 3 results) No results for input(s): PROBNP in the last 8760 hours. HbA1C: No results for input(s): HGBA1C in the last 72 hours. CBG: No results for input(s): GLUCAP in the last 168 hours. Lipid Profile: No results for input(s): CHOL, HDL, LDLCALC, TRIG, CHOLHDL, LDLDIRECT in the last 72 hours. Thyroid Function Tests: Recent Labs    12/27/18 1029  TSH 1.046   Anemia Panel: Recent Labs    12/27/18 1029  VITAMINB12 1,045*  FOLATE 10.1   Sepsis Labs: No results for input(s): PROCALCITON, LATICACIDVEN in the last 168 hours.  Recent Results (from the past 240 hour(s))  Surgical pcr screen     Status: None   Collection Time: 12/22/18  1:29 AM  Result Value Ref Range Status   MRSA, PCR NEGATIVE NEGATIVE Final   Staphylococcus aureus NEGATIVE NEGATIVE Final    Comment: (NOTE) The Xpert SA Assay (FDA approved for NASAL specimens in  patients 9 years of age and older), is one component of a comprehensive surveillance program. It is not intended to diagnose infection nor to guide or monitor treatment. Performed at [facility], [place] [address] [place], [place] [address]          Radiology Studies: Ct Head Wo Contrast  Result Date: 12/27/2018 CLINICAL DATA:  Encephalopathy EXAM: CT HEAD WITHOUT CONTRAST TECHNIQUE: Contiguous axial images were obtained from the base of the skull through the vertex without intravenous contrast. COMPARISON:  None. FINDINGS: Brain: No evidence of acute infarction, hemorrhage, hydrocephalus, extra-axial collection or mass lesion/mass effect. Vascular: Negative for hyperdense vessel Skull: Negative Sinuses/Orbits: Paranasal sinuses clear. Bilateral cataract surgery. Other: None IMPRESSION: Negative CT head Electronically Signed   By: Franchot Gallo M.D.   On: 12/27/2018 13:45        Scheduled Meds: . acetaminophen  500 mg Oral Q6H  . brimonidine  1 drop Left Eye BID  . cholecalciferol  1,000 Units Oral Daily  . cycloSPORINE  1 drop Both Eyes BID  . enoxaparin (LOVENOX) injection  40 mg Subcutaneous Q24H  . latanoprost  1 drop Both Eyes QHS  . mometasone-formoterol  2 puff Inhalation BID  . mycophenolate  180 mg Oral BID  . polyethylene glycol  17 g Oral BID  . prednisoLONE acetate  1 drop Left Eye QID  . prednisoLONE acetate  1 drop Right Eye Daily  . predniSONE  2.5 mg Oral Q supper  . predniSONE  5 mg Oral Q breakfast  . senna  2 tablet Oral QHS  . tacrolimus  0.5 mg Oral QHS  . tacrolimus  1 mg Oral Daily  . traZODone  50 mg Oral QHS   Continuous Infusions: . methocarbamol (ROBAXIN) IV       LOS: 6 days    Time spent: over 30 min    Fayrene Helper, MD [facility] Pager AMION  If 7PM-7AM, please contact night-coverage [email or web address] Password [email or web address] 12/27/2018, 4:22 PM

## 2018-12-27 NOTE — NC FL2 (Addendum)
[place] LEVEL OF CARE SCREENING TOOL     IDENTIFICATION  Patient Name: April [name] Birthdate: 1956-11-19 Sex: female Admission Date (Current Location): 12/21/2018  [facility] and [place] Number:  Herbalist and Address:  [facility] [address], [place] [address], [place], [place] [address]      Provider Number: [id number]  Attending Physician Name and Address:  Elodia Florence., *  Relative Name and Phone Number:  Edison Nasuti (spouse) [phone]    Current Level of Care: Hospital Recommended Level of Care: [place] Prior Approval Number:    Date Approved/Denied:   PASRR Number:    Discharge Plan: SNF    Current Diagnoses: Patient Active Problem List   Diagnosis Date Noted  . Bilateral closed proximal tibial fracture 12/21/2018  . Closed right ankle fracture 12/21/2018  . Fracture 12/21/2018  . Lower urinary tract infectious disease 10/03/2018  . Asthma 10/03/2018  . SIRS (systemic inflammatory response syndrome) ([place]) 10/03/2018  . Nausea vomiting and diarrhea 10/02/2018  . Chronic diastolic CHF (congestive heart failure) ([place]) 10/01/2017  . Influenza B 10/01/2017  . Persistent asthma with status asthmaticus   . Pneumonia 07/15/2016  . Asthma, mild intermittent 07/15/2016  . GERD (gastroesophageal reflux disease) 07/15/2016  . Renal transplant recipient 07/15/2016  . Hyperlipidemia 07/15/2016  . Depression 07/15/2016  . Bronchitis, mucopurulent recurrent ([place]) 08/08/2014  . Chronic cough 07/24/2014  . End stage renal disease ([place]) 03/23/2012  . 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, implant, and graft 03/23/2012    Orientation RESPIRATION BLADDER Height & Weight     Self  Normal Continent, External catheter Weight: 92.5 kg Height:  5' 1.2" (155.4 cm)  BEHAVIORAL SYMPTOMS/MOOD NEUROLOGICAL BOWEL NUTRITION STATUS      Continent Diet(see discharge summary)  AMBULATORY STATUS COMMUNICATION OF  NEEDS Skin   Extensive Assist Verbally Skin abrasions, Surgical wounds(left knee skin abrasion, ecchymosis right arm, right leg closed surgical incision, left pretibial closed surgical incision)                       Personal Care Assistance Level of Assistance  Bathing, Feeding, Dressing, Total care Bathing Assistance: Maximum assistance Feeding assistance: Independent Dressing Assistance: Maximum assistance Total Care Assistance: Maximum assistance   Functional Limitations Info  Sight, Hearing, Speech Sight Info: Impaired(retinal detachment right and left eyes) Hearing Info: Adequate Speech Info: Adequate    SPECIAL CARE FACTORS FREQUENCY  PT (By licensed PT), OT (By licensed OT)     PT Frequency: min 5x weekly OT Frequency: min 5x weekly             Contractures Contractures Info: Not present    Additional Factors Info  Code Status, Allergies Code Status Info: full Allergies Info: Infed (iron dextran), Pentamidine, Erythromycin, Oxycodone, Ultram (tramadol Hcl)           Current Medications (12/27/2018):  This is the current hospital active medication list Current Facility-Administered Medications  Medication Dose Route Frequency Provider Last Rate Last Dose  . acetaminophen (TYLENOL) tablet 500 mg  500 mg Oral Q6H Elodia Florence., MD      . albuterol (PROVENTIL) (2.5 MG/3ML) 0.083% nebulizer solution 2.5 mg  2.5 mg Nebulization Q6H PRN Patrecia Pace A, PA-C      . brimonidine (ALPHAGAN) 0.15 % ophthalmic solution 1 drop  1 drop Left Eye BID Delray Alt, PA-C   1 drop at 12/27/18 0847  . cholecalciferol (VITAMIN  D3) tablet 1,000 Units  1,000 Units Oral Daily Elodia Florence., MD   1,000 Units at 12/27/18 0845  . cycloSPORINE (RESTASIS) 0.05 % ophthalmic emulsion 1 drop  1 drop Both Eyes BID Delray Alt, PA-C   1 drop at 12/27/18 0846  . enoxaparin (LOVENOX) injection 40 mg  40 mg Subcutaneous Q24H Patrecia Pace A, PA-C   40 mg at 12/27/18 0901   . HYDROcodone-acetaminophen (NORCO/VICODIN) 5-325 MG per tablet 1-2 tablet  1-2 tablet Oral Q4H PRN Delray Alt, PA-C   1 tablet at 12/27/18 0845  . latanoprost (XALATAN) 0.005 % ophthalmic solution 1 drop  1 drop Both Eyes QHS Delray Alt, PA-C   1 drop at 12/26/18 2339  . methocarbamol (ROBAXIN) tablet 500 mg  500 mg Oral Q6H PRN Delray Alt, PA-C   500 mg at 12/26/18 2348   Or  . methocarbamol (ROBAXIN) 500 mg in dextrose 5 % 50 mL IVPB  500 mg Intravenous Q6H PRN Delray Alt, PA-C      . mometasone-formoterol (DULERA) 200-5 MCG/ACT inhaler 2 puff  2 puff Inhalation BID Patrecia Pace A, PA-C   2 puff at 12/27/18 0934  . morphine 2 MG/ML injection 2 mg  2 mg Intravenous Q2H PRN Delray Alt, PA-C   2 mg at 12/27/18 0450  . mycophenolate (MYFORTIC) EC tablet 180 mg  180 mg Oral BID Delray Alt, PA-C   180 mg at 12/27/18 0845  . ondansetron (ZOFRAN) injection 4 mg  4 mg Intravenous Q6H PRN Patrecia Pace A, PA-C   4 mg at 12/24/18 1004  . polyethylene glycol (MIRALAX / GLYCOLAX) packet 17 g  17 g Oral BID Elodia Florence., MD   17 g at 12/26/18 2345  . prednisoLONE acetate (PRED FORTE) 1 % ophthalmic suspension 1 drop  1 drop Left Eye QID Delray Alt, PA-C   1 drop at 12/27/18 0849  . prednisoLONE acetate (PRED FORTE) 1 % ophthalmic suspension 1 drop  1 drop Right Eye Daily Delray Alt, PA-C   1 drop at 12/27/18 0849  . predniSONE (DELTASONE) tablet 2.5 mg  2.5 mg Oral Q supper Patrecia Pace A, PA-C   2.5 mg at 12/26/18 1722  . predniSONE (DELTASONE) tablet 5 mg  5 mg Oral Q breakfast Patrecia Pace A, PA-C   5 mg at 12/27/18 0845  . senna (SENOKOT) tablet 17.2 mg  2 tablet Oral QHS Elodia Florence., MD   17.2 mg at 12/26/18 2345  . tacrolimus (PROGRAF) capsule 0.5 mg  0.5 mg Oral QHS Patrecia Pace A, PA-C   0.5 mg at 12/26/18 2347  . tacrolimus (PROGRAF) capsule 1 mg  1 mg Oral Daily Patrecia Pace A, PA-C   1 mg at 12/27/18 0846  . traZODone (DESYREL)  tablet 50 mg  50 mg Oral QHS Elodia Florence., MD   50 mg at 12/26/18 2347     Discharge Medications: Please see discharge summary for a list of discharge medications.  Relevant Imaging Results:  Relevant Lab Results:   Additional Information SSN: [id number]  Alberteen Sam, LCSW

## 2018-12-27 NOTE — Progress Notes (Signed)
Physical Therapy Treatment Patient Details Name: April [name] MRN: [id number] DOB: 01-05-1956 Today's Date: 12/27/2018    History of Present Illness Pt is a 63 yo female s/p fall resulting in R tibial plateau fx requiring ORIF and L ORIF to tibial fx. PMHx: arthritis and depression.    PT Comments    Patient seen for mobility progression. Pt was premedicated which helped with decreased pain however pt limited by lethargy this session. Pt on RA upon arrival and SpO2 87% so 1L O2 via [place] placed on pt and SpO2 up to 95% in sitting EOB. Worked on sitting balance EOB with pt reaching anteriorly as she tends to have posterior bias in sitting and while transferring. Attempted sit to stand but unable to achieve this session due to pt's difficulty with participation. Maxi move hoyer lift pad placed in pt's room for transfers OOB with nursing staff. Continue to progress as tolerated.     Follow Up Recommendations  SNF     Equipment Recommendations  Other (comment)(TBD next venue)    Recommendations for Other Services       Precautions / Restrictions Precautions Precautions: Knee;Fall Precaution Comments: hinged braces locked in extension at night and unlocked during the day for unrestricted ROM Required Braces or Orthoses: Other Brace(bilateral Hinged knee braces) Restrictions Weight Bearing Restrictions: Yes RLE Weight Bearing: Non weight bearing LLE Weight Bearing: Weight bearing as tolerated Other Position/Activity Restrictions: can pivot on left LE; NWB on R LE    Mobility  Bed Mobility Overal bed mobility: Needs Assistance Bed Mobility: Supine to Sit;Sit to Supine     Supine to sit: +2 for physical assistance;Max assist;HOB elevated Sit to supine: +2 for physical assistance;Total assist   General bed mobility comments: assistance to bring bilat LE and hips to EOB using bed pad and to elevate trunk into sitting; total for return to supine  Transfers Overall transfer level:  Needs assistance Equipment used: Rolling walker (2 wheeled) Transfers: Sit to/from Stand Sit to Stand: Total assist;+2 physical assistance;From elevated surface         General transfer comment: attempted sit to stand but unable; cues for hand placement and bilat LE positioning with therapist maintaining R LE NWB  Ambulation/Gait                 Stairs             Wheelchair Mobility    Modified Rankin (Stroke Patients Only)       Balance Overall balance assessment: Needs assistance Sitting-balance support: Bilateral upper extremity supported Sitting balance-Leahy Scale: Poor Sitting balance - Comments: pt likes to sit EOB with posterior lean; worked on sitting balance EOB with pt reaching anteriorly                                      Cognition Arousal/Alertness: Lethargic;Suspect due to medications Behavior During Therapy: Flat affect Overall Cognitive Status: Impaired/Different from baseline Area of Impairment: Following commands;Problem solving;Orientation                 Orientation Level: Disoriented to;Place;Time;Situation     Following Commands: Follows one step commands inconsistently;Follows one step commands with increased time     Problem Solving: Slow processing;Decreased initiation;Difficulty sequencing;Requires verbal cues;Requires tactile cues        Exercises      General Comments General comments (skin integrity, edema, etc.): pt on RA upon  arrival and SpO2 87% so placed on 1L and up to 95% in sitting and RN notified      Pertinent Vitals/Pain Pain Assessment: Faces Faces Pain Scale: Hurts little more Pain Location: bilateral legs Pain Descriptors / Indicators: Grimacing Pain Intervention(s): Limited activity within patient's tolerance;Premedicated before session;Repositioned    Home Living                      Prior Function            PT Goals (current goals can now be found in the care  plan section) Progress towards PT goals: Not progressing toward goals - comment(limited by lethargy)    Frequency    Min 5X/week      PT Plan Current plan remains appropriate    Co-evaluation              AM-PAC PT "6 Clicks" Mobility   Outcome Measure  Help needed turning from your back to your side while in a flat bed without using bedrails?: A Lot Help needed moving from lying on your back to sitting on the side of a flat bed without using bedrails?: A Lot Help needed moving to and from a bed to a chair (including a wheelchair)?: Total Help needed standing up from a chair using your arms (e.g., wheelchair or bedside chair)?: Total Help needed to walk in hospital room?: Total Help needed climbing 3-5 steps with a railing? : Total 6 Click Score: 8    End of Session Equipment Utilized During Treatment: Gait belt Activity Tolerance: Patient limited by lethargy Patient left: with call bell/phone within reach;in bed;with bed alarm set Nurse Communication: Mobility status;Other (comment)(use of lift for return to bed) PT Visit Diagnosis: Unsteadiness on feet (R26.81);Repeated falls (R29.6)     Time: 1540-0867 PT Time Calculation (min) (ACUTE ONLY): 31 min  Charges:  $Gait Training: 8-22 mins $Therapeutic Activity: 8-22 mins                     Earney Navy, PTA Acute Rehabilitation Services Pager: [phone] Office: [phone]     Darliss Cheney 12/27/2018, 10:28 AM

## 2018-12-27 NOTE — Clinical Social Work Note (Signed)
Clinical Social Work Assessment  Patient Details  Name: April [name] MRN: [id number] Date of Birth: 03/16/1956  Date of referral:  12/27/18               Reason for consult:  Discharge Planning                Permission sought to share information with:  Case Manager, Facility Sport and exercise psychologist, Family Supports Permission granted to share information::  Yes, Verbal Permission Granted  Name::     Edison Nasuti  Agency::  SNFs  Relationship::  spouse  Contact Information:  [phone]  Housing/Transportation Living arrangements for the past 2 months:  [place] of Information:  Patient Patient Interpreter Needed:  None Criminal Activity/Legal Involvement Pertinent to Current Situation/Hospitalization:  No - Comment as needed Significant Relationships:  Spouse Lives with:  Spouse Do you feel safe going back to the place where you live?  No Need for family participation in patient care:  Yes (Comment)  Care giving concerns:  CSW received referral for possible SNF placement at time of discharge. Spoke with patient regarding possibility of SNF placement . Patient's reports her family is currently unable to care for her at their home given patient's current needs and fall risk.  Patient expressed understanding of PT recommendation and are agreeable to SNF placement at time of discharge. CSW to continue to follow and assist with discharge planning needs.     Social Worker assessment / plan:  Spoke with patient concerning possibility of rehab at SNF before returning home. Patient appeared not oriented at times, asking CSW if she was at [facility] [place] and when the funeral was.CSW lvm with patient's husband to further assess discharge plan and choice of SNF due to patient's lack of orientation.    Employment status:  Retired Nurse, adult PT Recommendations:  [place] / Referral to community resources:  [place]  Patient/Family's Response to care:  Patient  recognizes need for rehab before returning home and are agreeable to a SNF . CSW left voice mail with patient's spouse to discuss SNF options, as patient appeared disoriented at times and poor historian. CSW explained insurance authorization process. CSW will continue to await husband's phone call back to consult regarding SNF placement options.    Patient/Family's Understanding of and Emotional Response to Diagnosis, Current Treatment, and Prognosis:  Patient is realistic regarding therapy needs and expressed being hopeful for SNF placement. Patient expressed understanding of CSW role and discharge process as well as medical condition. No questions/concerns about plan or treatment.    Emotional Assessment Appearance:  Appears stated age Attitude/Demeanor/Rapport:  Engaged Affect (typically observed):  Accepting, Adaptable Orientation:  Oriented to Self Alcohol / Substance use:  Not Applicable Psych involvement (Current and /or in the community):  No (Comment)  Discharge Needs  Concerns to be addressed:  Discharge Planning Concerns Readmission within the last 30 days:  No Current discharge risk:  Dependent with Mobility Barriers to Discharge:  Continued Medical Work up   [employer], LCSW 12/27/2018, 3:13 PM

## 2018-12-28 DIAGNOSIS — G9341 Metabolic encephalopathy: Secondary | ICD-10-CM

## 2018-12-28 LAB — MAGNESIUM: Magnesium: 2.1 mg/dL (ref 1.7–2.4)

## 2018-12-28 LAB — COMPREHENSIVE METABOLIC PANEL
ALBUMIN: 2.1 g/dL — AB (ref 3.5–5.0)
ALT: 16 U/L (ref 0–44)
AST: 24 U/L (ref 15–41)
Alkaline Phosphatase: 82 U/L (ref 38–126)
Anion gap: 11 (ref 5–15)
BUN: 40 mg/dL — ABNORMAL HIGH (ref 8–23)
CO2: 24 mmol/L (ref 22–32)
CREATININE: 1.21 mg/dL — AB (ref 0.44–1.00)
Calcium: 9.3 mg/dL (ref 8.9–10.3)
Chloride: 100 mmol/L (ref 98–111)
GFR calc Af Amer: 56 mL/min — ABNORMAL LOW (ref >60)
GFR calc non Af Amer: 48 mL/min — ABNORMAL LOW (ref >60)
Glucose, Bld: 103 mg/dL — ABNORMAL HIGH (ref 70–99)
Potassium: 4.8 mmol/L (ref 3.5–5.1)
Sodium: 135 mmol/L (ref 135–145)
Total Bilirubin: 2.3 mg/dL — ABNORMAL HIGH (ref 0.3–1.2)
Total Protein: 6.2 g/dL — ABNORMAL LOW (ref 6.5–8.1)

## 2018-12-28 LAB — CBC
HCT: 32.6 % — ABNORMAL LOW (ref 36.0–46.0)
Hemoglobin: 9.5 g/dL — ABNORMAL LOW (ref 12.0–15.0)
MCH: 28.4 pg (ref 26.0–34.0)
MCHC: 29.1 g/dL — ABNORMAL LOW (ref 30.0–36.0)
MCV: 97.6 fL (ref 80.0–100.0)
NRBC: 0 % (ref 0.0–0.2)
Platelets: 133 10*3/uL — ABNORMAL LOW (ref 150–400)
RBC: 3.34 MIL/uL — AB (ref 3.87–5.11)
RDW: 18 % — ABNORMAL HIGH (ref 11.5–15.5)
WBC: 8.3 10*3/uL (ref 4.0–10.5)

## 2018-12-28 NOTE — Progress Notes (Signed)
Orthopaedic Trauma Progress Note  S: Patient doing well this morning, delirium much improved from yesterday. Pain has been well controlled on current regimen. Continuing to work with therapies, states this is going okay. LCSW working on Ship [employer] for SNF placement.  O:  Vitals:   12/28/18 0241 12/28/18 0817  BP: (!) 147/68   Pulse: 87   Resp: 18   Temp: 98.4 F (36.9 C)   SpO2: 100% 96%   General: Laying in bed, alert and oriented. No acute distress  RLE: Hinge knee brace in place. Dressing clean/dry/intact. Minimal swelling noted in foot. Mild tenderness to palpation of knee. Able to wiggle toes. Sensation intact. Neurovascularly intact with brisk cap refill.   LLE: Hinge knee brace in place. Dressing clean/dry/intact. Some swelling in ankle and foot.Tenderness to palpation of lower leg over fracture site. Compartments soft and compressible. Able to wiggle toes. Sensation intact. Neurovascularly intact with brisk cap refill.  Imaging: stable post op imaging  Labs:  Results for orders placed or performed during the hospital encounter of 12/21/18 (from the past 24 hour(s))  Ammonia     Status: None   Collection Time: 12/27/18 10:29 AM  Result Value Ref Range   Ammonia 23 9 - 35 umol/L  Vitamin B12     Status: Abnormal   Collection Time: 12/27/18 10:29 AM  Result Value Ref Range   Vitamin B-12 1,045 (H) 180 - 914 pg/mL  TSH     Status: None   Collection Time: 12/27/18 10:29 AM  Result Value Ref Range   TSH 1.046 0.350 - 4.500 uIU/mL  Folate     Status: None   Collection Time: 12/27/18 10:29 AM  Result Value Ref Range   Folate 10.1 >5.9 ng/mL  Blood gas, venous     Status: Abnormal   Collection Time: 12/27/18 10:50 AM  Result Value Ref Range   pH, Ven 7.461 (H) 7.250 - 7.430   pCO2, Ven 34.8 (L) 44.0 - 60.0 mmHg   pO2, Ven 98.3 (H) 32.0 - 45.0 mmHg   Bicarbonate 24.5 20.0 - 28.0 mmol/L   Acid-Base Excess 1.0 0.0 - 2.0 mmol/L   O2 Saturation 97.9 %    Patient temperature 98.6   CBC     Status: Abnormal   Collection Time: 12/28/18  2:36 AM  Result Value Ref Range   WBC 8.3 4.0 - 10.5 K/uL   RBC 3.34 (L) 3.87 - 5.11 MIL/uL   Hemoglobin 9.5 (L) 12.0 - 15.0 g/dL   HCT 32.6 (L) 36.0 - 46.0 %   MCV 97.6 80.0 - 100.0 fL   MCH 28.4 26.0 - 34.0 pg   MCHC 29.1 (L) 30.0 - 36.0 g/dL   RDW 18.0 (H) 11.5 - 15.5 %   Platelets 133 (L) 150 - 400 K/uL   nRBC 0.0 0.0 - 0.2 %  Comprehensive metabolic panel     Status: Abnormal   Collection Time: 12/28/18  2:36 AM  Result Value Ref Range   Sodium 135 135 - 145 mmol/L   Potassium 4.8 3.5 - 5.1 mmol/L   Chloride 100 98 - 111 mmol/L   CO2 24 22 - 32 mmol/L   Glucose, Bld 103 (H) 70 - 99 mg/dL   BUN 40 (H) 8 - 23 mg/dL   Creatinine, Ser 1.21 (H) 0.44 - 1.00 mg/dL   Calcium 9.3 8.9 - 10.3 mg/dL   Total Protein 6.2 (L) 6.5 - 8.1 g/dL   Albumin 2.1 (L) 3.5 - 5.0 g/dL  AST 24 15 - 41 U/L   ALT 16 0 - 44 U/L   Alkaline Phosphatase 82 38 - 126 U/L   Total Bilirubin 2.3 (H) 0.3 - 1.2 mg/dL   GFR calc non Af Amer 48 (L) >60 mL/min   GFR calc Af Amer 56 (L) >60 mL/min   Anion gap 11 5 - 15  Magnesium     Status: None   Collection Time: 12/28/18  2:36 AM  Result Value Ref Range   Magnesium 2.1 1.7 - 2.4 mg/dL    Assessment: 63 year old female s/p fall down steps  Injuries: 1. Right Schatzker II tibial plateau fracture s/p ORIF on 12/23/18 2. Right lateral malleolus fracture s/p stress exam under anesthesia and non-operative treatment  3. Left proximal tibial shaft fracture s/p ORIF on 12/23/18  Weightbearing: NWB RLE, Weightbearing LLE for transfers only  Insicional and dressing care: bilateral lower extremity dressings chamged. Can be changed as needed  Orthopedic device(s): bilateral hinge knee braces  CV/Blood loss: Hgb 9.5 today.  Hemodynamically stable  Pain management: 1. Robaxin 500 mg q 6 hours PRN 2. Morphine 2mg  q 2 hours PRN 3. Norco 5/325 , 1-2 tablets q 4 hours PRN 4. Tylenol 500 mg  q 6 hours scheduled  VTE prophylaxis: Lovenox 40 mg daily  ID: Ancef 2gm postoperatively completed  Foley/Lines: no foley, KVO IVFs  Medical co-morbidities: Asthma, CHF, ESRD, Hyperlipidemia, Hyperparathyroidism, Depression, Polycystic Kidney   Dispo: PT/OT eval, awaiting insurance auth for SNF placement  Follow - up plan: 2 weeks   April [name] [facility] ?[phone]? (phone)

## 2018-12-28 NOTE — Progress Notes (Signed)
Physical Therapy Treatment Patient Details Name: April [name] MRN: [id number] DOB: 1956/04/01 Today's Date: 12/28/2018    History of Present Illness Pt is a 63 yo female s/p fall resulting in R tibial plateau fx requiring ORIF and L ORIF to tibial fx. PMHx: arthritis and depression.    PT Comments    Patient seen for mobility progression. Pt is making gradual progress toward PT goals and requires less for bed mobility and lateral scoot transfer this session. Pt demonstrates much improved unsupported sitting balance EOB and seems to be tolerating pain a little better today. Pt requires mod A-mod A +2 for bed mobility and max A +2 for lateral scoot transfer EOB to drop arm recliner. Maxi move hoyer lift should be utilized for OOB transfers with nursing staff.  Continue to progress as tolerated with anticipated d/c to SNF for further skilled PT services.     Follow Up Recommendations  SNF     Equipment Recommendations  Other (comment)(TBD next venue)    Recommendations for Other Services       Precautions / Restrictions Precautions Precautions: Knee;Fall Precaution Comments: hinged braces locked in extension at night and unlocked during the day for unrestricted ROM Required Braces or Orthoses: Other Brace(bilateral Hinged knee braces) Restrictions Weight Bearing Restrictions: Yes RLE Weight Bearing: Non weight bearing LLE Weight Bearing: Weight bearing as tolerated(for transfers only)    Mobility  Bed Mobility Overal bed mobility: Needs Assistance Bed Mobility: Rolling;Sidelying to Sit Rolling: Mod assist Sidelying to sit: +2 for physical assistance;Mod assist       General bed mobility comments: cues for sequencing to roll R and L with use of bed rail and assist to mobilize LE; pt assisted to lift trunk into sitting; assistance required to bring bilat LE and hips to EOB and to elevate trunk into sitting  Transfers Overall transfer level: Needs assistance Equipment  used: Rolling walker (2 wheeled) Transfers: Lateral/Scoot Transfers          Lateral/Scoot Transfers: Max assist;+2 physical assistance General transfer comment: pt able to assist mininally with L LE; therapist assisted to move bilat LE througout pivot and to maintain R LE NWB status; use of gait belt and bed pad; pt did much better maintaining anterior translation of trunk throughout; cues for sequencing   Ambulation/Gait                 Stairs             Wheelchair Mobility    Modified Rankin (Stroke Patients Only)       Balance Overall balance assessment: Needs assistance Sitting-balance support: Bilateral upper extremity supported Sitting balance-Leahy Scale: Fair Sitting balance - Comments: improved sitting balance without posterior bias today                                    Cognition Arousal/Alertness: Awake/alert Behavior During Therapy: WFL for tasks assessed/performed Overall Cognitive Status: Within Functional Limits for tasks assessed                                        Exercises      General Comments General comments (skin integrity, edema, etc.): pt c/o soreness around top of L LE brace so top strap loosended and there is a red spot underneath. RN notified and bilat top  straps loosened to skin a break      Pertinent Vitals/Pain Pain Assessment: Faces Faces Pain Scale: Hurts even more Pain Location: bilateral legs with mobility  Pain Descriptors / Indicators: Grimacing;Guarding;Sore Pain Intervention(s): Limited activity within patient's tolerance;Monitored during session;Repositioned    Home Living                      Prior Function            PT Goals (current goals can now be found in the care plan section) Progress towards PT goals: Progressing toward goals    Frequency    Min 5X/week      PT Plan Current plan remains appropriate    Co-evaluation               AM-PAC PT "6 Clicks" Mobility   Outcome Measure  Help needed turning from your back to your side while in a flat bed without using bedrails?: A Lot Help needed moving from lying on your back to sitting on the side of a flat bed without using bedrails?: A Lot Help needed moving to and from a bed to a chair (including a wheelchair)?: Total Help needed standing up from a chair using your arms (e.g., wheelchair or bedside chair)?: Total Help needed to walk in hospital room?: Total Help needed climbing 3-5 steps with a railing? : Total 6 Click Score: 8    End of Session Equipment Utilized During Treatment: Gait belt Activity Tolerance: Patient tolerated treatment well Patient left: with call bell/phone within reach;in chair;with chair alarm set Nurse Communication: Mobility status;Other (comment)(use of lift for return to bed) PT Visit Diagnosis: Unsteadiness on feet (R26.81);Repeated falls (R29.6)     Time: 0802-2336 PT Time Calculation (min) (ACUTE ONLY): 47 min  Charges:  $Gait Training: 8-22 mins $Therapeutic Activity: 23-37 mins                     Earney Navy, PTA Acute Rehabilitation Services Pager: [phone] Office: [phone]     Darliss Cheney 12/28/2018, 2:51 PM

## 2018-12-28 NOTE — Progress Notes (Signed)
Occupational Therapy Treatment Patient Details Name: April [name] MRN: [id number] DOB: Mar 28, 1956 Today's Date: 12/28/2018    History of present illness Pt is a 63 yo female s/p fall resulting in R tibial plateau fx requiring ORIF and L ORIF to tibial fx. PMHx: arthritis and depression.   OT comments  Pt is a 63 yo female s/p above dx. Pt tolerating sitting in recliner for OT session. Pt performing BUE AROM exercises seen in section below shoulder through digits 10 reps, 2 sets- handout provided. Pt pulling self using armrests forward in chair for 20 secs, 30 secs and 1 minute. Pt performing grooming task seated with set-upA due to weakness in grip strength in BUEs. Pt with decreased strength and mobility now requiring lift for safe transfers. Pt improving slowly and continues to require modA for UB ADL and MaxA to April [name] for ADL. Pt would benefit from continued OT skilled services for ADL, mobility and safety in SNF setting.    Follow Up Recommendations  SNF;Supervision/Assistance - 24 hour    Equipment Recommendations  3 in 1 bedside commode    Recommendations for Other Services      Precautions / Restrictions Precautions Precautions: Knee;Fall Precaution Comments: hinged braces locked in extension at night and unlocked during the day for unrestricted ROM Required Braces or Orthoses: Other Brace(bilateral hinge knee braces) Restrictions Weight Bearing Restrictions: Yes RLE Weight Bearing: Non weight bearing LLE Weight Bearing: Weight bearing as tolerated(transfers only)       Mobility Bed Mobility Overal bed mobility: Needs Assistance Bed Mobility: Rolling;Sidelying to Sit Rolling: Mod assist Sidelying to sit: +2 for physical assistance;Mod assist       General bed mobility comments: received in recliner  Transfers Overall transfer level: Needs assistance Equipment used: Rolling walker (2 wheeled) Transfers: Lateral/Scoot Transfers          Lateral/Scoot  Transfers: Max assist;+2 physical assistance General transfer comment: Pt pulling self up in chair with BLEs reclined to simulate bed mobility strengthening    Balance Overall balance assessment: Needs assistance Sitting-balance support: Bilateral upper extremity supported Sitting balance-Leahy Scale: Fair Sitting balance - Comments: improved sitting balance without posterior bias today                                   ADL either performed or assessed with clinical judgement   ADL Overall ADL's : Needs assistance/impaired     Grooming: Set up;Sitting                                 General ADL Comments: Pt requires increased assist at this time for mobility and ADLs as her RUE with swelling and pain- also her dominant hand. Set-upA for grooming in chair     Vision   Vision Assessment?: Vision impaired- to be further tested in functional context   Perception     Praxis      Cognition Arousal/Alertness: Awake/alert Behavior During Therapy: WFL for tasks assessed/performed Overall Cognitive Status: Within Functional Limits for tasks assessed                                          Exercises Exercises: General Upper Extremity(handout provided) General Exercises - Upper Extremity Shoulder Flexion: AROM;20 reps;Both;Seated Shoulder ABduction: AROM;20  reps;Both;Seated Elbow Flexion: AROM;20 reps;Both;Seated Elbow Extension: AROM;20 reps;Seated;Both Wrist Flexion: AROM;20 reps;Seated Wrist Extension: AROM;20 reps;Seated;Both Digit Composite Flexion: AROM;20 reps;Seated;Both Chair Push Up: (attempted)   Shoulder Instructions       General Comments pt c/o soreness around top of L LE brace so top strap loosended and there is a red spot underneath. RN notified and bilat top straps loosened to skin a break    Pertinent Vitals/ Pain       Pain Assessment: 0-10 Pain Score: 5  Faces Pain Scale: Hurts even more Pain Location:  bilateral legs with mobility  Pain Descriptors / Indicators: Grimacing;Guarding;Sore Pain Intervention(s): Limited activity within patient's tolerance  Home Living                                          Prior Functioning/Environment              Frequency  Min 2X/week        Progress Toward Goals  OT Goals(current goals can now be found in the care plan section)  Progress towards OT goals: Progressing toward goals  Acute Rehab OT Goals Patient Stated Goal: to get stronger to go home OT Goal Formulation: With patient Time For Goal Achievement: 01/07/19 Potential to Achieve Goals: Good ADL Goals Pt Will Perform Grooming: with set-up;sitting Pt Will Perform Lower Body Dressing: with mod assist;sitting/lateral leans;bed level Pt Will Transfer to Toilet: with min assist;with +2 assist;bedside commode Additional ADL Goal #1: Pt will perform transfers with modA +2 with sliding board  Plan Discharge plan needs to be updated    Co-evaluation                 AM-PAC OT "6 Clicks" Daily Activity     Outcome Measure   Help from another person eating meals?: None Help from another person taking care of personal grooming?: A Little Help from another person toileting, which includes using toliet, bedpan, or urinal?: Total Help from another person bathing (including washing, rinsing, drying)?: A Lot Help from another person to put on and taking off regular upper body clothing?: A Little Help from another person to put on and taking off regular lower body clothing?: Total 6 Click Score: 14    End of Session    OT Visit Diagnosis: Unsteadiness on feet (R26.81);Muscle weakness (generalized) (M62.81)   Activity Tolerance Patient limited by pain;Treatment limited secondary to medical complications (Comment)   Patient Left in chair;with call bell/phone within reach;with chair alarm set   Nurse Communication Mobility status        Time:  1500-1530 OT Time Calculation (min): 30 min  Charges: OT General Charges $OT Visit: 1 Visit OT Treatments $Self Care/Home Management : 8-22 mins $Therapeutic Exercise: 8-22 mins  Darryl Nestle) Marsa Aris OTR/L Acute Rehabilitation Services Pager: [phone] Office: [phone]    Fredda Hammed 12/28/2018, 3:42 PM

## 2018-12-28 NOTE — Progress Notes (Signed)
PROGRESS NOTE    April [name]  GYI:[id number] DOB: 1956-01-04 DOA: 12/21/2018 PCP: [name], MD   Brief Narrative:  April [name] a 63 y.o.femalewith medical history significant ofthis post renal transplant (h/o polycystic kidney disease), chronic kidney disease stage III,asthma, chronic diastolic CHF who presented after a mechanical fall found to have bilateral knee fractures    Assessment & Plan:   Active Problems:   Asthma, mild intermittent   Chronic diastolic CHF (congestive heart failure) (HCC)   Bilateral closed proximal tibial fracture   Closed right ankle fracture   Fracture   Right Lateral Tibial Plateau Fracture Left Comminuted Displaced Fracture of Proximal Tibial Metaphysis Comminuted Displaced Proximal Fibular Diaphyseal Fracture Non displaced Fracture of Distal Right Fibula S/p ORIF R lateral tibial plateau fx, ORIF L proximal tibial fracture, stress examination of R ankle on 1/31 by ortho Plan for nonweightbearing (can stand pivot transfer to LLE, but NWB to RLE) Lovenox for DVT ppx - ortho recommending 30 days Norco, morphine prn pain control.  Bowel regimen.  Will cont APAP, watch total daily APAP dose.  Follow vitamin D level (low, start supplementation) PT/OT recommending CIR   Acute Encephalopathy: A&Ox2 (intermittently confused, said [facility] briefly today, but able to correct).  Daughter has noted some mild confusion, intermittent over past few days.  Suspect this is delirium related to her acute hospitalization, pain, surgery.  No focal deficits appreciated on exam.  Today she seems more alert and awake although does doze off easily. This is interfering with her ability to participate with therapy  Follow UA (not suggestive of infection) Follow VBG (without evidence hypercarbia), B12 (elevated), folate (wnl), ammonia (wnl) CT of head was negative.  Suspect confusion is multifactorial with medications, pain, and acute illness. Delirium  precautions Will continue to monitor, additional w/u as needed  Acute Kidney Injury on CKD  Hyponatremia  Hx Renal Transplant  Hx PCKD  CKD I don't see any obviously nephrotoxic meds on mar other than vancomycin No obvious hypotension noted.  Possibly poor PO intake after surgery? UA bland.  Urine sodium low, suggestive of pre renal etiology.  She does not appear grossly overloaded. Continues to improve today with IVF with creatinine of 1.2, continue to monitor.  Follow CXR (stable scarring L base, no acute cardiopulmonary findings) Renal US (with renal transplant in R pelvis without adverse features) Renal c/s given lack of improvement, appreciate recs - suspects mild ATN Pt also hyponatremic, continue IVF Continue prednisone, tacrolimus, and mycophenolate  Hyperglycemia: A1c 5.4, continue to monitor BG  Asthma  O2 Requirement:  No evidence of exacerbation Improved, intermittently on and off O2, I think this will continue to improve post op Continue IS, flutter W/u additionally if persistent or worsening  HFpEF: Per previous provider, pt with grade 1 diastolic dysfunction.  I don't see echo in chart.  In care everywhere, she has unremarkable echo from 2013.    Nausea: possibly related to pain meds above (oxy causes nausea for her).  Prn antiemetics.  Leukocytosis: improved, persistent.  Continue to monitor.  Afebrile.   Elevated LFT's: mild, follow  Concern for OSA and hoarseness: pt concerned she may have OSA, also concerned about hoarseness.  Discussed some hoarseness may be from recent surgery/intubation.  Will recommend follow up outpatient for sleep study.  Follow to ensure hoarseness resolves as well.  DVT prophylaxis: Lovenox SQ  Code Status:     Code Status Orders  (From admission, onward)  Start     Ordered   12/22/18 0126  Full code  Continuous     12/22/18 0125        Code Status History    Date Active Date Inactive Code Status Order ID  Comments User Context   10/03/2018 0047 10/05/2018 1907 Full Code [id number]  Rise Patience, MD Inpatient   10/01/2017 2110 10/02/2017 1942 Full Code [id number]  Vianne Bulls, MD ED   07/15/2016 2352 07/16/2016 2035 Full Code [id number]  Reubin Milan, MD ED     Family Communication: None present Disposition Plan:   Skilled nursing facility awaiting authorization Consults called: ortho Admission status: Inpatient   Consultants:   ortho  Procedures:  Dg Wrist Complete Right  Result Date: 12/21/2018 CLINICAL DATA:  Trip and fall RIGHT wrist pain. EXAM: RIGHT WRIST - COMPLETE 3+ VIEW COMPARISON:  RIGHT wrist radiograph October 13, 2014. FINDINGS: There is no evidence of fracture or dislocation. There is no evidence of arthropathy or other focal bone abnormality. Osteopenia. Intravenous catheter in place. Soft tissues are unremarkable. IMPRESSION: 1. No acute fracture deformity or dislocation. 2. Osteopenia decreases sensitivity for acute nondisplaced fractures. Electronically Signed   By: Elon Alas M.D.   On: 12/21/2018 19:58   Dg Ankle 2 Views Left  Result Date: 12/21/2018 CLINICAL DATA:  Fall EXAM: LEFT ANKLE - 2 VIEW COMPARISON:  None. FINDINGS: There is no evidence of fracture, dislocation, or joint effusion. There is no evidence of arthropathy or other focal bone abnormality. Soft tissues are unremarkable. IMPRESSION: Negative. Electronically Signed   By: Ulyses Jarred M.D.   On: 12/21/2018 19:56   Dg Ankle 2 Views Right  Result Date: 12/21/2018 CLINICAL DATA:  Fall EXAM: RIGHT ANKLE - 2 VIEW COMPARISON:  None. FINDINGS: Nondisplaced fracture of the distal right fibula with mild soft tissue swelling. Ankle mortise remains approximated. No distal tibia fracture. IMPRESSION: Nondisplaced fracture of the distal right fibula with overlying soft tissue swelling. Electronically Signed   By: Ulyses Jarred M.D.   On: 12/21/2018 19:57   Ct Head Wo Contrast  Result Date:  12/27/2018 CLINICAL DATA:  Encephalopathy EXAM: CT HEAD WITHOUT CONTRAST TECHNIQUE: Contiguous axial images were obtained from the base of the skull through the vertex without intravenous contrast. COMPARISON:  None. FINDINGS: Brain: No evidence of acute infarction, hemorrhage, hydrocephalus, extra-axial collection or mass lesion/mass effect. Vascular: Negative for hyperdense vessel Skull: Negative Sinuses/Orbits: Paranasal sinuses clear. Bilateral cataract surgery. Other: None IMPRESSION: Negative CT head Electronically Signed   By: Franchot Gallo M.D.   On: 12/27/2018 13:45   Ct Knee Left Wo Contrast  Result Date: 12/21/2018 CLINICAL DATA:  Post fall with bilateral knee pain. Bilateral knee fractures. EXAM: CT OF THE LEFT KNEE WITHOUT CONTRAST TECHNIQUE: Multidetector CT imaging of the LEFT knee was performed according to the standard protocol. Multiplanar CT image reconstructions were also generated. COMPARISON:  Radiographs earlier this day. FINDINGS: Bones/Joint/Cartilage Comminuted displaced fracture of the proximal tibial metaphysis. Displacement anteriorly of approximately 9 mm. Fracture approaches but does not extend to the articular surface laterally. There is fracture involvement of the tibial attachment of the patellar tendon. Comminuted displaced proximal fibular diaphyseal fracture. No involvement of the fibular head. Patella and distal femur are intact. There is no knee joint effusion. Ligaments Suboptimally assessed by CT. ACL and PCL fibers are visualized. Muscles and Tendons Hematoma superficial to the patellar tendon, with fracture at the tibial insertion. Quadriceps tendon is intact. No intramuscular collection. Soft tissues Soft  tissue hematoma anteriorly extending from the patellar tendon to the proximal tibial diaphysis. IMPRESSION: 1. Comminuted displaced fracture of the proximal tibial metaphysis. Displacement anteriorly of approximately 9 mm, involving the patellar tendon insertion.  Fracture approaches but does not extend to the articular surface laterally. 2. Comminuted displaced proximal fibular diaphyseal fracture. Electronically Signed   By: Keith Rake M.D.   On: 12/21/2018 22:56   Ct Knee Right Wo Contrast  Result Date: 12/21/2018 CLINICAL DATA:  Trip and fall with bilateral knee pain. Bilateral knee fractures. EXAM: CT OF THE RIGHT KNEE WITHOUT CONTRAST TECHNIQUE: Multidetector CT imaging of the RIGHT knee was performed according to the standard protocol. Multiplanar CT image reconstructions were also generated. COMPARISON:  Radiographs earlier this day. FINDINGS: Bones/Joint/Cartilage Comminuted tibial plateau fracture with dominant fracture in the lateral tibial plateau which is decompressed, articular depression of approximately 7 mm. Fracture extends through the tibial spines with suspected nondisplaced posteromedial compartment involvement. No metaphyseal fracture plane. Proximal fibula, patella, and distal femur are intact without additional acute fracture. Moderately point hemarthrosis. Ligaments Suboptimally assessed by CT. PCL fibers are visualized, attenuated ACL fibers tentatively visualized. Muscles and Tendons Quadriceps and patellar tendons are intact. No evidence of intramuscular hematoma. Soft tissues Mild subcutaneous soft tissue edema. IMPRESSION: Comminuted lateral tibial plateau fracture with articular depression of approximately 7 mm. Fracture extends through the tibial spines with suspected nondisplaced posteromedial compartment involvement. Electronically Signed   By: Keith Rake M.D.   On: 12/21/2018 22:51   US Renal  Result Date: 12/25/2018 CLINICAL DATA:  63 year old female with acute kidney injury. Polycystic renal disease, renal transplant. EXAM: RENAL / URINARY TRACT ULTRASOUND COMPLETE COMPARISON:  CT Abdomen and Pelvis 05/15/2013. FINDINGS: Right Kidney: Renal measurements: 13.6 x 6.5 x 7.5 centimeters = volume: 345 mL. Virtually no normal  right renal parenchyma is identified. Numerous renal cysts, more than 10 individually up to 6.1 centimeters diameter. No right hydronephrosis suspected. Left Kidney: Renal measurements: 15.4 x 8.8 x 9.3 centimeters = volume: 659 mL. Virtually no normal left renal parenchyma, with more than 10 left renal cysts individually up to 6.6 centimeters diameter. No left hydronephrosis suspected. Bladder: Appears normal for degree of bladder distention. Other findings: Right pelvis renal transplant with normal cortical echogenicity, no transplant hydronephrosis. The transplant kidney measures 10.6 x 6.3 x 4.8 centimeters (volume 168 milliliters). IMPRESSION: 1. Renal transplant in the right pelvis with no adverse features. 2. Native polycystic kidney disease with no normal native renal parenchyma identified. 3. Unremarkable urinary bladder. Electronically Signed   By: Genevie Ann M.D.   On: 12/25/2018 16:25   Dg Chest Port 1 View  Result Date: 12/25/2018 CLINICAL DATA:  Hypoxia. EXAM: PORTABLE CHEST 1 VIEW COMPARISON:  12/21/2018 FINDINGS: 1232 hours. The cardio pericardial silhouette is enlarged. There is pulmonary vascular congestion without overt pulmonary edema. Interstitial markings are diffusely coarsened with chronic features. Scarring at the left base is stable in the interval. The visualized bony structures of the thorax are intact. IMPRESSION: Stable scarring left base.  No acute cardiopulmonary findings. Electronically Signed   By: Misty Stanley M.D.   On: 12/25/2018 15:06   Dg Chest Port 1 View  Result Date: 12/21/2018 CLINICAL DATA:  Stated history of postop. Patient reports preop knee surgery. EXAM: PORTABLE CHEST 1 VIEW COMPARISON:  Radiographs 10/13/2018 FINDINGS: The cardiomediastinal contours are unchanged. Streaky left lung base scarring, unchanged from prior. Pulmonary vasculature is normal. No consolidation, pleural effusion, or pneumothorax. No acute osseous abnormalities are seen. IMPRESSION: Left  lung base scarring.  No acute chest findings. Electronically Signed   By: Keith Rake M.D.   On: 12/21/2018 20:21   Dg Knee Complete 4 Views Left  Result Date: 12/23/2018 CLINICAL DATA:  ORIF. EXAM: DG C-ARM 61-120 MIN; LEFT KNEE - COMPLETE 4+ VIEW COMPARISON:  CT 12/21/2018. FINDINGS: Plate and screw fixation of the proximal tibia noted. Hardware intact. Near anatomic alignment. Proximal fibular fracture noted. IMPRESSION: Plate screw fixation of proximal tibia. Hardware intact. Near anatomic alignment. Electronically Signed   By: Marcello Moores  Register   On: 12/23/2018 11:10   Dg Knee Complete 4 Views Left  Result Date: 12/21/2018 CLINICAL DATA:  Fall with knee pain EXAM: LEFT KNEE - COMPLETE 4+ VIEW COMPARISON:  None. FINDINGS: Oblique fracture of the proximal left tibia with mild medial displacement. No intra-articular extension. There is also a comminuted fracture of the proximal shaft of the left fibula. Minimal displacement. The knee remains approximated. IMPRESSION: Extra-articular fractures of the proximal tibia and fibula, with mild medial displacement of the tibia fracture. Electronically Signed   By: Ulyses Jarred M.D.   On: 12/21/2018 19:54   Dg Knee Complete 4 Views Right  Result Date: 12/23/2018 CLINICAL DATA:  ORIF EXAM: RIGHT KNEE - COMPLETE 4+ VIEW COMPARISON:  12/21/2018. FINDINGS: ORIF proximal right tibia.  Hardware intact.  Anatomic alignment. IMPRESSION: ORIF proximal right tibia.  Anatomic alignment. Electronically Signed   By: Marcello Moores  Register   On: 12/23/2018 11:14   Dg Knee Complete 4 Views Right  Result Date: 12/21/2018 CLINICAL DATA:  Trip and fall onto knee, deformity. EXAM: RIGHT KNEE - COMPLETE 4+ VIEW COMPARISON:  None. FINDINGS: Acute mildly depressed lateral tibial plateau fracture. Linear lucency through medial tibial plateau. No dislocation. No destructive bony lesions. Osteopenia. Lipohemarthrosis suspected. IMPRESSION: 1. Acute depressed lateral tibial plateau  fracture. Potential nondisplaced medial plateau component. No dislocation. 2. Osteopenia decreases sensitivity for acute nondisplaced fractures. Electronically Signed   By: Elon Alas M.D.   On: 12/21/2018 19:57   Dg Knee Right Port  Result Date: 12/23/2018 CLINICAL DATA:  S/P bilateral tibial plateau's. EXAM: PORTABLE RIGHT KNEE - 1-2 VIEW COMPARISON:  Radiograph 12/21/2018 FINDINGS: Internal plate fixation tibial plateau fracture with dynamic fixation plate and 5 fixation cortical screws. Anatomic alignment. No complication. IMPRESSION: Internal fixation of tibial plateau fracture. Electronically Signed   By: Suzy Bouchard M.D.   On: 12/23/2018 12:44   Dg Tibia/fibula Left Port  Result Date: 12/23/2018 CLINICAL DATA:  BILATERAL tibial plateau fractures post surgery EXAM: PORTABLE LEFT TIBIA AND FIBULA - 2 VIEW COMPARISON:  Intraoperative images [id number] FINDINGS: Lateral bar tests plate and multiple screws present at the proximal LEFT tibia post ORIF of a proximal metaphyseal fracture. Knee and ankle joint alignments normal. Mildly displaced fracture of proximal LEFT fibular diaphysis also identified. IMPRESSION: Proximal LEFT fibular diaphyseal fracture, mildly displaced. Post ORIF of a proximal LEFT tibial metaphyseal fracture. Electronically Signed   By: Lavonia Dana M.D.   On: 12/23/2018 12:36   Dg C-arm 1-60 Min  Result Date: 12/23/2018 CLINICAL DATA:  ORIF. EXAM: DG C-ARM 61-120 MIN; LEFT KNEE - COMPLETE 4+ VIEW COMPARISON:  CT 12/21/2018. FINDINGS: Plate and screw fixation of the proximal tibia noted. Hardware intact. Near anatomic alignment. Proximal fibular fracture noted. IMPRESSION: Plate screw fixation of proximal tibia. Hardware intact. Near anatomic alignment. Electronically Signed   By: [place]   On: 12/23/2018 11:10     Antimicrobials:   None   Subjective: Patient appears to be  less confused this morning answering questions appropriately, does fall asleep  easily in the setting of pain medications postoperatively.  No evidence of an acute delirium  Objective: Vitals:   12/28/18 0500 12/28/18 0817 12/28/18 0924 12/28/18 1311  BP:   132/79 (!) 146/71  Pulse:   92 88  Resp:   16 16  Temp:   98.4 F (36.9 C)   TempSrc:   Oral   SpO2:  96% 97% 97%  Weight: 90.4 kg     Height:        Intake/Output Summary (Last 24 hours) at 12/28/2018 1428 Last data filed at 12/28/2018 0830 Gross per 24 hour  Intake 360 ml  Output 1900 ml  Net -1540 ml   Filed Weights   12/22/18 0139 12/27/18 0500 12/28/18 0500  Weight: 82.7 kg 92.5 kg 90.4 kg    Examination:  General exam: Appears calm and comfortable  Respiratory system: Clear to auscultation. Respiratory effort normal. Cardiovascular system: S1 & S2 heard, RRR. No JVD, murmurs, rubs, gallops or clicks. No pedal edema. Gastrointestinal system: Abdomen is nondistended, soft and nontender. No organomegaly or masses felt. Normal bowel sounds heard. Central nervous system: Alert and oriented. No focal neurological deficits. Extremities: Symmetric 5 x 5 power. Skin: No rashes, lesions or ulcers Psychiatry: Judgement and insight appear normal. Mood & affect appropriate.     Data Reviewed: I have personally reviewed following labs and imaging studies  CBC: Recent Labs  Lab 12/21/18 1959  12/24/18 0655 12/25/18 0309 12/26/18 0342 12/27/18 0343 12/28/18 0236  WBC 9.1   < > 22.1* 19.5* 10.7* 9.7 8.3  NEUTROABS 8.1*  --   --   --   --   --   --   HGB 11.5*   < > 10.0* 9.6* 9.3* 9.3* 9.5*  HCT 40.0   < > 34.0* 31.7* 31.7* 30.6* 32.6*  MCV 100.3*   < > 96.6 95.5 96.1 95.3 97.6  PLT 168   < > 201 210 159 144* 133*   < > = values in this interval not displayed.   Basic Metabolic Panel: Recent Labs  Lab 12/24/18 0655 12/24/18 1558 12/25/18 0309 12/26/18 0342 12/27/18 0343 12/28/18 0236  NA 132* 129* 129* 131* 132* 135  K 5.5* 5.2* 5.1 4.9 4.5 4.8  CL 98 96* 97* 98 99 100  CO2 22 21* 21*  20* 24 24  GLUCOSE 148* 142* 139* 121* 133* 103*  BUN 39* 41* 47* 48* 44* 40*  CREATININE 1.83* 1.67* 1.74* 1.32* 1.22* 1.21*  CALCIUM 9.1 8.7* 9.0 9.1 9.0 9.3  MG 2.0  --  2.2 2.3 2.1 2.1   GFR: Estimated Creatinine Clearance: 50.2 mL/min (A) (by C-G formula based on SCr of 1.21 mg/dL (H)). Liver Function Tests: Recent Labs  Lab 12/21/18 1959 12/23/18 0244 12/25/18 0309 12/26/18 0342 12/28/18 0236  AST 22 17 49* 41 24  ALT 15 12 26 23 16   ALKPHOS 67 66 70 74 82  BILITOT 1.1 2.6* 2.5* 2.2* 2.3*  PROT 7.3 7.3 6.7 5.8* 6.2*  ALBUMIN 3.4* 2.8* 2.3* 2.2* 2.1*   No results for input(s): LIPASE, AMYLASE in the last 168 hours. Recent Labs  Lab 12/27/18 1029  AMMONIA 23   Coagulation Profile: No results for input(s): INR, PROTIME in the last 168 hours. Cardiac Enzymes: Recent Labs  Lab 12/25/18 0309  CKTOTAL 128   BNP (last 3 results) No results for input(s): PROBNP in the last 8760 hours. HbA1C: No results for input(s):  HGBA1C in the last 72 hours. CBG: No results for input(s): GLUCAP in the last 168 hours. Lipid Profile: No results for input(s): CHOL, HDL, LDLCALC, TRIG, CHOLHDL, LDLDIRECT in the last 72 hours. Thyroid Function Tests: Recent Labs    12/27/18 1029  TSH 1.046   Anemia Panel: Recent Labs    12/27/18 1029  VITAMINB12 1,045*  FOLATE 10.1   Sepsis Labs: No results for input(s): PROCALCITON, LATICACIDVEN in the last 168 hours.  Recent Results (from the past 240 hour(s))  Surgical pcr screen     Status: None   Collection Time: 12/22/18  1:29 AM  Result Value Ref Range Status   MRSA, PCR NEGATIVE NEGATIVE Final   Staphylococcus aureus NEGATIVE NEGATIVE Final    Comment: (NOTE) The Xpert SA Assay (FDA approved for NASAL specimens in patients 63 years of age and older), is one component of a comprehensive surveillance program. It is not intended to diagnose infection nor to guide or monitor treatment. Performed at [facility], [place] [address], [place], [place] [address]          Radiology Studies: Ct Head Wo Contrast  Result Date: 12/27/2018 CLINICAL DATA:  Encephalopathy EXAM: CT HEAD WITHOUT CONTRAST TECHNIQUE: Contiguous axial images were obtained from the base of the skull through the vertex without intravenous contrast. COMPARISON:  None. FINDINGS: Brain: No evidence of acute infarction, hemorrhage, hydrocephalus, extra-axial collection or mass lesion/mass effect. Vascular: Negative for hyperdense vessel Skull: Negative Sinuses/Orbits: Paranasal sinuses clear. Bilateral cataract surgery. Other: None IMPRESSION: Negative CT head Electronically Signed   By: Franchot Gallo M.D.   On: 12/27/2018 13:45        Scheduled Meds: . acetaminophen  500 mg Oral Q6H  . brimonidine  1 drop Left Eye BID  . cholecalciferol  1,000 Units Oral Daily  . cycloSPORINE  1 drop Both Eyes BID  . enoxaparin (LOVENOX) injection  40 mg Subcutaneous Q24H  . latanoprost  1 drop Both Eyes QHS  . mometasone-formoterol  2 puff Inhalation BID  . mycophenolate  180 mg Oral BID  . polyethylene glycol  17 g Oral BID  . prednisoLONE acetate  1 drop Left Eye QID  . prednisoLONE acetate  1 drop Right Eye Daily  . predniSONE  2.5 mg Oral Q supper  . predniSONE  5 mg Oral Q breakfast  . senna  2 tablet Oral QHS  . tacrolimus  0.5 mg Oral QHS  . tacrolimus  1 mg Oral Daily  . traZODone  50 mg Oral QHS   Continuous Infusions: . methocarbamol (ROBAXIN) IV       LOS: 7 days    Time spent: 35 min    Nicolette Bang, MD [facility]  If 7PM-7AM, please contact night-coverage  12/28/2018, 2:28 PM

## 2018-12-29 LAB — BASIC METABOLIC PANEL
ANION GAP: 10 (ref 5–15)
BUN: 35 mg/dL — ABNORMAL HIGH (ref 8–23)
CO2: 23 mmol/L (ref 22–32)
Calcium: 9 mg/dL (ref 8.9–10.3)
Chloride: 104 mmol/L (ref 98–111)
Creatinine, Ser: 1.04 mg/dL — ABNORMAL HIGH (ref 0.44–1.00)
GFR calc Af Amer: >60 mL/min (ref >60)
GFR calc non Af Amer: 58 mL/min — ABNORMAL LOW (ref >60)
Glucose, Bld: 134 mg/dL — ABNORMAL HIGH (ref 70–99)
Potassium: 5.2 mmol/L — ABNORMAL HIGH (ref 3.5–5.1)
Sodium: 137 mmol/L (ref 135–145)

## 2018-12-29 MED ORDER — HYDROCODONE-ACETAMINOPHEN 5-325 MG PO TABS: 1.0000 | ORAL_TABLET | ORAL | 0 refills | Status: DC | PRN

## 2018-12-29 MED ORDER — ENOXAPARIN SODIUM 40 MG/0.4ML ~~LOC~~ SOLN: 40.0000 mg | SUBCUTANEOUS | 0 refills | Status: DC

## 2018-12-29 NOTE — Progress Notes (Signed)
Patient will DC to:[facility] Anticipated DC date:12/29/2018 Family notified:Spouse Transport PH:XTAV  Per MD patient ready for DC to [facility] . RN, patient, patient's family, and facility notified of DC. Discharge Summary sent to facility. RN given number for report [phone] Room 606 Lelon Frohlich is supervisor). DC packet on chart. Ambulance transport requested for patient.  CSW signing off.  [place], [place]

## 2018-12-29 NOTE — Clinical Social Work Placement (Signed)
   CLINICAL SOCIAL WORK PLACEMENT  NOTE  Date:  12/29/2018  Patient Details  Name: April [name] MRN: [id number] Date of Birth: 02-Oct-1956  Clinical Social Work is seeking post-discharge placement for this patient at the [place] level of care (*CSW will initial, date and re-position this form in  chart as items are completed):      Patient/family provided with [facility] Work Department's list of facilities offering this level of care within the geographic area requested by the patient (or if unable, by the patient's family).  Yes   Patient/family informed of their freedom to choose among providers that offer the needed level of care, that participate in Medicare, Medicaid or managed care program needed by the patient, have an available bed and are willing to accept the patient.      Patient/family informed of [facility]'s ownership interest in [facility] and [facility], as well as of the fact that they are under no obligation to receive care at these facilities.  PASRR submitted to EDS on       PASRR number received on 12/29/18     Existing PASRR number confirmed on       FL2 transmitted to all facilities in geographic area requested by pt/family on 12/28/18     FL2 transmitted to all facilities within larger geographic area on       Patient informed that his/her managed care company has contracts with or will negotiate with certain facilities, including the following:        Yes   Patient/family informed of bed offers received.  Patient chooses bed at [facility]     Physician recommends and patient chooses bed at      Patient to be transferred to [facility] on 12/29/18.  Patient to be transferred to facility by [facility]     Patient family notified on 12/29/18 of transfer.  Name of family member notified:  Edison Nasuti     PHYSICIAN       Additional Comment:    _______________________________________________ Alberteen Sam,  LCSW 12/29/2018, 1:44 PM

## 2018-12-29 NOTE — Progress Notes (Signed)
Discharge instructions completed with pt and her daughter.  They verbalized understanding of the information.  Pt denies chest pain, shortness of breath, dizziness, lightheadedness, and n/v.  Pt's IV discontinued.  Pt waiting for [facility].

## 2018-12-29 NOTE — Progress Notes (Signed)
PASRR number received: [id number] [place], [place]

## 2018-12-29 NOTE — Discharge Summary (Signed)
Physician Discharge Summary  April [name] QIW:[id number] DOB: 11/05/1956 DOA: 12/21/2018  PCP: [name], MD  Admit date: 12/21/2018 Discharge date: 12/29/2018  Admitted From: Inpatient Disposition: SNF  Recommendations for Outpatient Follow-up:  1. Follow up with PCP in 1-2 weeks 2. Please obtain BMP/CBC in one week 3. Please follow up on the following pending results:  Home Health:No Equipment/Devices:NONE  Discharge Condition:Stable CODE STATUS:Full code Diet recommendation: Renal diet  Brief/Interim Summary: April Zehring Jeffersis a 63 y.o.femalewith medical history significant ofthis post renal transplant (h/o polycystic kidney disease), chronic kidney disease stage III,asthma, chronic diastolic CHF who presented after a mechanical fall found to have bilateral knee fractures   Hospital course: Right Lateral Tibial Plateau Fracture Left Comminuted Displaced Fracture of Proximal Tibial Metaphysis Comminuted Displaced Proximal Fibular Diaphyseal Fracture Non displaced Fracture of Distal Right Fibula S/p ORIF R lateral tibial plateau fx, ORIF L proximal tibial fracture, stress examination of R ankle on 1/31 by ortho Plan for nonweightbearing (can stand pivot transfer to LLE, but NWB to RLE) Lovenox for DVT ppx - ortho recommending 30 days Norco, morphine prn pain control. Bowel regimen. Will cont APAP, watch total daily APAP dose. PT/OT recommending CIR with patient transitioning there today  Acute Encephalopathy: Resolved  Acute Kidney Injury on CKD  Hyponatremia  Hx Renal Transplant  Hx PCKD  CKD I don't see any obviously nephrotoxic meds on mar No obvious hypotension noted. Likely attributable to poor p.o. intake after surgery UA bland. Urine sodium low, suggestive of pre renal etiology. She does not appear grossly overloaded. ContinueD to improvewith IVF with creatinine of 1.2, continue to monitor.  Follow CXR (stable scarring L base, no acute  cardiopulmonary findings) Renal US (with renal transplant in R pelvis without adverse features) Renal c/s given lack of improvement, appreciate recs - suspects mild ATN Pt also hyponatremic which resolved with continued IVF Continue prednisone, tacrolimus, and mycophenolate  Hyperglycemia: A1c 5.4, continue to monitor BG  Asthma  O2 Requirement:  Improved postoperatively.  Patient now at baseline  HFpEF: Per previous provider, pt with grade 1 diastolic dysfunction. I don't see echo in chart. In care everywhere, she has unremarkable echo from 2013.   Nausea: possibly related to pain meds above (oxy causes nausea for her). Prn antiemetics treated effectively.  Now stable and resolved  Leukocytosis: improved, persistent. Continue to monitor. Afebrile.   Elevated LFT's:mild, follow outpatient PCP  Concern for OSA and hoarseness: pt concerned she may have OSA, also concerned about hoarseness. Discussed some hoarseness may be from recent surgery/intubation. Will recommend follow up outpatient for sleep study. Follow to ensure hoarseness resolves as well.  Discharge Diagnoses:  Active Problems:   Asthma, mild intermittent   Chronic diastolic CHF (congestive heart failure) (HCC)   Bilateral closed proximal tibial fracture   Closed right ankle fracture   Fracture    Discharge Instructions  Discharge Instructions    Call MD for:  difficulty breathing, headache or visual disturbances   Complete by:  As directed    Call MD for:  persistant nausea and vomiting   Complete by:  As directed    Call MD for:  redness, tenderness, or signs of infection (pain, swelling, redness, odor or green/yellow discharge around incision site)   Complete by:  As directed    Call MD for:  severe uncontrolled pain   Complete by:  As directed    Call MD for:  temperature >100.4   Complete by:  As directed    Diet -  low sodium heart healthy   Complete by:  As directed    Increase activity  slowly   Complete by:  As directed      Allergies as of 12/29/2018      Reactions   Infed [iron Dextran] Other (See Comments)   Chest tightness   Pentamidine Itching, Shortness Of Breath, Swelling   Erythromycin [erythromycin] Other (See Comments)   Mouth Ulcers   Oxycodone Nausea Only, Nausea And Vomiting   Erythromycin Rash   Causes breakout in mouth   Ultram [tramadol Hcl] Anxiety      Medication List    TAKE these medications   acetaminophen 500 MG tablet Commonly known as:  TYLENOL Take 1,000 mg by mouth every 6 (six) hours as needed for moderate pain.   acyclovir 400 MG tablet Commonly known as:  ZOVIRAX Take 400 mg by mouth 2 (two) times daily as needed (outbreaks).   albuterol 108 (90 Base) MCG/ACT inhaler Commonly known as:  PROVENTIL HFA;VENTOLIN HFA Inhale 2 puffs into the lungs every 4 (four) hours as needed for wheezing or shortness of breath.   alendronate 70 MG tablet Commonly known as:  FOSAMAX Take 70 mg by mouth every Friday.   allopurinol 300 MG tablet Commonly known as:  ZYLOPRIM Take 300 mg by mouth daily.   aspirin EC 81 MG tablet Take 81 mg by mouth daily.   brimonidine 0.1 % Soln Commonly known as:  ALPHAGAN P Place 1 drop 2 (two) times daily into the left eye.   budesonide-formoterol 160-4.5 MCG/ACT inhaler Commonly known as:  SYMBICORT Inhale 2 puffs 2 (two) times daily as needed into the lungs (SOB).   cholecalciferol 25 MCG (1000 UT) tablet Commonly known as:  VITAMIN D3 Take 1,000 Units by mouth every other day.   Colchicine 0.6 MG Caps Take 0.6 mg by mouth daily as needed (gout).   cycloSPORINE 0.05 % ophthalmic emulsion Commonly known as:  RESTASIS Place 1 drop into both eyes 2 (two) times daily.   enoxaparin 40 MG/0.4ML injection Commonly known as:  LOVENOX Inject 0.4 mLs (40 mg total) into the skin daily for 28 days. Start taking on:  December 30, 2018   FLUoxetine 40 MG capsule Commonly known as:  PROZAC Take 40 mg by  mouth daily.   furosemide 40 MG tablet Commonly known as:  LASIX Take 1 tablet by mouth daily as needed for edema.   HYDROcodone-acetaminophen 5-325 MG tablet Commonly known as:  NORCO/VICODIN Take 1-2 tablets by mouth every 4 (four) hours as needed for moderate pain.   latanoprost 0.005 % ophthalmic solution Commonly known as:  XALATAN Place 1 drop into both eyes at bedtime.   magnesium oxide 400 MG tablet Commonly known as:  MAG-OX Take 400 mg by mouth 2 (two) times daily.   mycophenolate 180 MG EC tablet Commonly known as:  MYFORTIC Take 180 mg by mouth 2 (two) times daily.   pantoprazole 40 MG tablet Commonly known as:  PROTONIX Take 40 mg by mouth daily as needed (acid reflux).   prednisoLONE acetate 1 % ophthalmic suspension Commonly known as:  PRED FORTE Place 1 drop See admin instructions into both eyes. 1 drop left eye four times daily. 1 drop right eye once daily.   predniSONE 5 MG tablet Commonly known as:  DELTASONE Take 2.5-5 mg by mouth See admin instructions. Take 5mg  by mouth in the morning and 2.5mg  in the evening   tacrolimus 0.5 MG capsule Commonly known as:  PROGRAF Take  0.5 mg by mouth daily. Take 0.5mg  by mouth daily at 22:00. Pt is taking brand name prograf.   PROGRAF 1 MG capsule Generic drug:  tacrolimus Take 1 mg by mouth daily. Take 1mg  by mouth each morning at 10:00. Pt is taking brand name prograf.   TRAVATAN Z 0.004 % Soln ophthalmic solution Generic drug:  Travoprost (BAK Free) Place 1 drop into both eyes at bedtime.   vitamin B-12 100 MCG tablet Commonly known as:  CYANOCOBALAMIN Take 100 mcg by mouth daily.       Allergies  Allergen Reactions  . Infed [Iron Dextran] Other (See Comments)    Chest tightness  . Pentamidine Itching, Shortness Of Breath and Swelling  . Erythromycin [Erythromycin] Other (See Comments)    Mouth Ulcers  . Oxycodone Nausea Only and Nausea And Vomiting  . Erythromycin Rash    Causes breakout in  mouth  . Ultram [Tramadol Hcl] Anxiety    Consultations:  ortho   Procedures/Studies: Dg Wrist Complete Right  Result Date: 12/21/2018 CLINICAL DATA:  Trip and fall RIGHT wrist pain. EXAM: RIGHT WRIST - COMPLETE 3+ VIEW COMPARISON:  RIGHT wrist radiograph October 13, 2014. FINDINGS: There is no evidence of fracture or dislocation. There is no evidence of arthropathy or other focal bone abnormality. Osteopenia. Intravenous catheter in place. Soft tissues are unremarkable. IMPRESSION: 1. No acute fracture deformity or dislocation. 2. Osteopenia decreases sensitivity for acute nondisplaced fractures. Electronically Signed   By: Elon Alas M.D.   On: 12/21/2018 19:58   Dg Ankle 2 Views Left  Result Date: 12/21/2018 CLINICAL DATA:  Fall EXAM: LEFT ANKLE - 2 VIEW COMPARISON:  None. FINDINGS: There is no evidence of fracture, dislocation, or joint effusion. There is no evidence of arthropathy or other focal bone abnormality. Soft tissues are unremarkable. IMPRESSION: Negative. Electronically Signed   By: Ulyses Jarred M.D.   On: 12/21/2018 19:56   Dg Ankle 2 Views Right  Result Date: 12/21/2018 CLINICAL DATA:  Fall EXAM: RIGHT ANKLE - 2 VIEW COMPARISON:  None. FINDINGS: Nondisplaced fracture of the distal right fibula with mild soft tissue swelling. Ankle mortise remains approximated. No distal tibia fracture. IMPRESSION: Nondisplaced fracture of the distal right fibula with overlying soft tissue swelling. Electronically Signed   By: Ulyses Jarred M.D.   On: 12/21/2018 19:57   Ct Head Wo Contrast  Result Date: 12/27/2018 CLINICAL DATA:  Encephalopathy EXAM: CT HEAD WITHOUT CONTRAST TECHNIQUE: Contiguous axial images were obtained from the base of the skull through the vertex without intravenous contrast. COMPARISON:  None. FINDINGS: Brain: No evidence of acute infarction, hemorrhage, hydrocephalus, extra-axial collection or mass lesion/mass effect. Vascular: Negative for hyperdense vessel  Skull: Negative Sinuses/Orbits: Paranasal sinuses clear. Bilateral cataract surgery. Other: None IMPRESSION: Negative CT head Electronically Signed   By: Franchot Gallo M.D.   On: 12/27/2018 13:45   Ct Knee Left Wo Contrast  Result Date: 12/21/2018 CLINICAL DATA:  Post fall with bilateral knee pain. Bilateral knee fractures. EXAM: CT OF THE LEFT KNEE WITHOUT CONTRAST TECHNIQUE: Multidetector CT imaging of the LEFT knee was performed according to the standard protocol. Multiplanar CT image reconstructions were also generated. COMPARISON:  Radiographs earlier this day. FINDINGS: Bones/Joint/Cartilage Comminuted displaced fracture of the proximal tibial metaphysis. Displacement anteriorly of approximately 9 mm. Fracture approaches but does not extend to the articular surface laterally. There is fracture involvement of the tibial attachment of the patellar tendon. Comminuted displaced proximal fibular diaphyseal fracture. No involvement of the fibular head. Patella and  distal femur are intact. There is no knee joint effusion. Ligaments Suboptimally assessed by CT. ACL and PCL fibers are visualized. Muscles and Tendons Hematoma superficial to the patellar tendon, with fracture at the tibial insertion. Quadriceps tendon is intact. No intramuscular collection. Soft tissues Soft tissue hematoma anteriorly extending from the patellar tendon to the proximal tibial diaphysis. IMPRESSION: 1. Comminuted displaced fracture of the proximal tibial metaphysis. Displacement anteriorly of approximately 9 mm, involving the patellar tendon insertion. Fracture approaches but does not extend to the articular surface laterally. 2. Comminuted displaced proximal fibular diaphyseal fracture. Electronically Signed   By: Keith Rake M.D.   On: 12/21/2018 22:56   Ct Knee Right Wo Contrast  Result Date: 12/21/2018 CLINICAL DATA:  Trip and fall with bilateral knee pain. Bilateral knee fractures. EXAM: CT OF THE RIGHT KNEE WITHOUT  CONTRAST TECHNIQUE: Multidetector CT imaging of the RIGHT knee was performed according to the standard protocol. Multiplanar CT image reconstructions were also generated. COMPARISON:  Radiographs earlier this day. FINDINGS: Bones/Joint/Cartilage Comminuted tibial plateau fracture with dominant fracture in the lateral tibial plateau which is decompressed, articular depression of approximately 7 mm. Fracture extends through the tibial spines with suspected nondisplaced posteromedial compartment involvement. No metaphyseal fracture plane. Proximal fibula, patella, and distal femur are intact without additional acute fracture. Moderately point hemarthrosis. Ligaments Suboptimally assessed by CT. PCL fibers are visualized, attenuated ACL fibers tentatively visualized. Muscles and Tendons Quadriceps and patellar tendons are intact. No evidence of intramuscular hematoma. Soft tissues Mild subcutaneous soft tissue edema. IMPRESSION: Comminuted lateral tibial plateau fracture with articular depression of approximately 7 mm. Fracture extends through the tibial spines with suspected nondisplaced posteromedial compartment involvement. Electronically Signed   By: Keith Rake M.D.   On: 12/21/2018 22:51   US Renal  Result Date: 12/25/2018 CLINICAL DATA:  64 year old female with acute kidney injury. Polycystic renal disease, renal transplant. EXAM: RENAL / URINARY TRACT ULTRASOUND COMPLETE COMPARISON:  CT Abdomen and Pelvis 05/15/2013. FINDINGS: Right Kidney: Renal measurements: 13.6 x 6.5 x 7.5 centimeters = volume: 345 mL. Virtually no normal right renal parenchyma is identified. Numerous renal cysts, more than 10 individually up to 6.1 centimeters diameter. No right hydronephrosis suspected. Left Kidney: Renal measurements: 15.4 x 8.8 x 9.3 centimeters = volume: 659 mL. Virtually no normal left renal parenchyma, with more than 10 left renal cysts individually up to 6.6 centimeters diameter. No left hydronephrosis  suspected. Bladder: Appears normal for degree of bladder distention. Other findings: Right pelvis renal transplant with normal cortical echogenicity, no transplant hydronephrosis. The transplant kidney measures 10.6 x 6.3 x 4.8 centimeters (volume 168 milliliters). IMPRESSION: 1. Renal transplant in the right pelvis with no adverse features. 2. Native polycystic kidney disease with no normal native renal parenchyma identified. 3. Unremarkable urinary bladder. Electronically Signed   By: Genevie Ann M.D.   On: 12/25/2018 16:25   Dg Chest Port 1 View  Result Date: 12/25/2018 CLINICAL DATA:  Hypoxia. EXAM: PORTABLE CHEST 1 VIEW COMPARISON:  12/21/2018 FINDINGS: 1232 hours. The cardio pericardial silhouette is enlarged. There is pulmonary vascular congestion without overt pulmonary edema. Interstitial markings are diffusely coarsened with chronic features. Scarring at the left base is stable in the interval. The visualized bony structures of the thorax are intact. IMPRESSION: Stable scarring left base.  No acute cardiopulmonary findings. Electronically Signed   By: Misty Stanley M.D.   On: 12/25/2018 15:06   Dg Chest Port 1 View  Result Date: 12/21/2018 CLINICAL DATA:  Stated history of postop.  Patient reports preop knee surgery. EXAM: PORTABLE CHEST 1 VIEW COMPARISON:  Radiographs 10/13/2018 FINDINGS: The cardiomediastinal contours are unchanged. Streaky left lung base scarring, unchanged from prior. Pulmonary vasculature is normal. No consolidation, pleural effusion, or pneumothorax. No acute osseous abnormalities are seen. IMPRESSION: Left lung base scarring.  No acute chest findings. Electronically Signed   By: Keith Rake M.D.   On: 12/21/2018 20:21   Dg Knee Complete 4 Views Left  Result Date: 12/23/2018 CLINICAL DATA:  ORIF. EXAM: DG C-ARM 61-120 MIN; LEFT KNEE - COMPLETE 4+ VIEW COMPARISON:  CT 12/21/2018. FINDINGS: Plate and screw fixation of the proximal tibia noted. Hardware intact. Near anatomic  alignment. Proximal fibular fracture noted. IMPRESSION: Plate screw fixation of proximal tibia. Hardware intact. Near anatomic alignment. Electronically Signed   By: Marcello Moores  Register   On: 12/23/2018 11:10   Dg Knee Complete 4 Views Left  Result Date: 12/21/2018 CLINICAL DATA:  Fall with knee pain EXAM: LEFT KNEE - COMPLETE 4+ VIEW COMPARISON:  None. FINDINGS: Oblique fracture of the proximal left tibia with mild medial displacement. No intra-articular extension. There is also a comminuted fracture of the proximal shaft of the left fibula. Minimal displacement. The knee remains approximated. IMPRESSION: Extra-articular fractures of the proximal tibia and fibula, with mild medial displacement of the tibia fracture. Electronically Signed   By: Ulyses Jarred M.D.   On: 12/21/2018 19:54   Dg Knee Complete 4 Views Right  Result Date: 12/23/2018 CLINICAL DATA:  ORIF EXAM: RIGHT KNEE - COMPLETE 4+ VIEW COMPARISON:  12/21/2018. FINDINGS: ORIF proximal right tibia.  Hardware intact.  Anatomic alignment. IMPRESSION: ORIF proximal right tibia.  Anatomic alignment. Electronically Signed   By: Marcello Moores  Register   On: 12/23/2018 11:14   Dg Knee Complete 4 Views Right  Result Date: 12/21/2018 CLINICAL DATA:  Trip and fall onto knee, deformity. EXAM: RIGHT KNEE - COMPLETE 4+ VIEW COMPARISON:  None. FINDINGS: Acute mildly depressed lateral tibial plateau fracture. Linear lucency through medial tibial plateau. No dislocation. No destructive bony lesions. Osteopenia. Lipohemarthrosis suspected. IMPRESSION: 1. Acute depressed lateral tibial plateau fracture. Potential nondisplaced medial plateau component. No dislocation. 2. Osteopenia decreases sensitivity for acute nondisplaced fractures. Electronically Signed   By: Elon Alas M.D.   On: 12/21/2018 19:57   Dg Knee Right Port  Result Date: 12/23/2018 CLINICAL DATA:  S/P bilateral tibial plateau's. EXAM: PORTABLE RIGHT KNEE - 1-2 VIEW COMPARISON:  Radiograph  12/21/2018 FINDINGS: Internal plate fixation tibial plateau fracture with dynamic fixation plate and 5 fixation cortical screws. Anatomic alignment. No complication. IMPRESSION: Internal fixation of tibial plateau fracture. Electronically Signed   By: Suzy Bouchard M.D.   On: 12/23/2018 12:44   Dg Tibia/fibula Left Port  Result Date: 12/23/2018 CLINICAL DATA:  BILATERAL tibial plateau fractures post surgery EXAM: PORTABLE LEFT TIBIA AND FIBULA - 2 VIEW COMPARISON:  Intraoperative images 1302020 FINDINGS: Lateral bar tests plate and multiple screws present at the proximal LEFT tibia post ORIF of a proximal metaphyseal fracture. Knee and ankle joint alignments normal. Mildly displaced fracture of proximal LEFT fibular diaphysis also identified. IMPRESSION: Proximal LEFT fibular diaphyseal fracture, mildly displaced. Post ORIF of a proximal LEFT tibial metaphyseal fracture. Electronically Signed   By: Lavonia Dana M.D.   On: 12/23/2018 12:36   Dg C-arm 1-60 Min  Result Date: 12/23/2018 CLINICAL DATA:  ORIF. EXAM: DG C-ARM 61-120 MIN; LEFT KNEE - COMPLETE 4+ VIEW COMPARISON:  CT 12/21/2018. FINDINGS: Plate and screw fixation of the proximal tibia noted. Hardware intact.  Near anatomic alignment. Proximal fibular fracture noted. IMPRESSION: Plate screw fixation of proximal tibia. Hardware intact. Near anatomic alignment. Electronically Signed   By: Marcello Moores  Register   On: 12/23/2018 11:10       Subjective:   Discharge Exam: Vitals:   12/29/18 0414 12/29/18 0840  BP: 132/79   Pulse: 80 96  Resp: 16 18  Temp: 97.7 F (36.5 C)   SpO2: 99% 96%   Vitals:   12/28/18 1311 12/28/18 2126 12/29/18 0414 12/29/18 0840  BP: (!) 146/71  132/79   Pulse: 88  80 96  Resp: 16  16 18   Temp:   97.7 F (36.5 C)   TempSrc:   Oral   SpO2: 97% 97% 99% 96%  Weight:      Height:        General: Pt is alert, awake, not in acute distress Cardiovascular: RRR, S1/S2 +, no rubs, no gallops Respiratory: CTA  bilaterally, no wheezing, no rhonchi Abdominal: Soft, NT, ND, bowel sounds + Extremities: no edema, no cyanosis    The results of significant diagnostics from this hospitalization (including imaging, microbiology, ancillary and laboratory) are listed below for reference.     Microbiology: Recent Results (from the past 240 hour(s))  Surgical pcr screen     Status: None   Collection Time: 12/22/18  1:29 AM  Result Value Ref Range Status   MRSA, PCR NEGATIVE NEGATIVE Final   Staphylococcus aureus NEGATIVE NEGATIVE Final    Comment: (NOTE) The Xpert SA Assay (FDA approved for NASAL specimens in patients 47 years of age and older), is one component of a comprehensive surveillance program. It is not intended to diagnose infection nor to guide or monitor treatment. Performed at [facility], [place] [address], [place], [place] [address]      Labs: BNP (last 3 results) Recent Labs    10/02/18 1930  BNP 977.4*   Basic Metabolic Panel: Recent Labs  Lab 12/24/18 0655  12/25/18 0309 12/26/18 0342 12/27/18 0343 12/28/18 0236 12/29/18 0233  NA 132*   < > 129* 131* 132* 135 137  K 5.5*   < > 5.1 4.9 4.5 4.8 5.2*  CL 98   < > 97* 98 99 100 104  CO2 22   < > 21* 20* 24 24 23   GLUCOSE 148*   < > 139* 121* 133* 103* 134*  BUN 39*   < > 47* 48* 44* 40* 35*  CREATININE 1.83*   < > 1.74* 1.32* 1.22* 1.21* 1.04*  CALCIUM 9.1   < > 9.0 9.1 9.0 9.3 9.0  MG 2.0  --  2.2 2.3 2.1 2.1  --    < > = values in this interval not displayed.   Liver Function Tests: Recent Labs  Lab 12/23/18 0244 12/25/18 0309 12/26/18 0342 12/28/18 0236  AST 17 49* 41 24  ALT 12 26 23 16   ALKPHOS 66 70 74 82  BILITOT 2.6* 2.5* 2.2* 2.3*  PROT 7.3 6.7 5.8* 6.2*  ALBUMIN 2.8* 2.3* 2.2* 2.1*   No results for input(s): LIPASE, AMYLASE in the last 168 hours. Recent Labs  Lab 12/27/18 1029  AMMONIA 23   CBC: Recent Labs  Lab 12/24/18 0655 12/25/18 0309 12/26/18 0342 12/27/18 0343  12/28/18 0236  WBC 22.1* 19.5* 10.7* 9.7 8.3  HGB 10.0* 9.6* 9.3* 9.3* 9.5*  HCT 34.0* 31.7* 31.7* 30.6* 32.6*  MCV 96.6 95.5 96.1 95.3 97.6  PLT 201 210 159 144* 133*   Cardiac Enzymes:  Recent Labs  Lab 12/25/18 0309  CKTOTAL 128   BNP: Invalid input(s): POCBNP CBG: No results for input(s): GLUCAP in the last 168 hours. D-Dimer No results for input(s): DDIMER in the last 72 hours. Hgb A1c No results for input(s): HGBA1C in the last 72 hours. Lipid Profile No results for input(s): CHOL, HDL, LDLCALC, TRIG, CHOLHDL, LDLDIRECT in the last 72 hours. Thyroid function studies Recent Labs    12/27/18 1029  TSH 1.046   Anemia work up Recent Labs    12/27/18 1029  VITAMINB12 1,045*  FOLATE 10.1   Urinalysis    Component Value Date/Time   COLORURINE YELLOW 12/26/2018 1531   APPEARANCEUR CLEAR 12/26/2018 1531   LABSPEC 1.008 12/26/2018 1531   PHURINE 6.0 12/26/2018 1531   GLUCOSEU NEGATIVE 12/26/2018 1531   HGBUR SMALL (A) 12/26/2018 1531   BILIRUBINUR NEGATIVE 12/26/2018 1531   KETONESUR NEGATIVE 12/26/2018 1531   PROTEINUR NEGATIVE 12/26/2018 1531   UROBILINOGEN 0.2 05/15/2013 0733   NITRITE NEGATIVE 12/26/2018 1531   LEUKOCYTESUR NEGATIVE 12/26/2018 1531   Sepsis Labs Invalid input(s): PROCALCITONIN,  WBC,  LACTICIDVEN Microbiology Recent Results (from the past 240 hour(s))  Surgical pcr screen     Status: None   Collection Time: 12/22/18  1:29 AM  Result Value Ref Range Status   MRSA, PCR NEGATIVE NEGATIVE Final   Staphylococcus aureus NEGATIVE NEGATIVE Final    Comment: (NOTE) The Xpert SA Assay (FDA approved for NASAL specimens in patients 12 years of age and older), is one component of a comprehensive surveillance program. It is not intended to diagnose infection nor to guide or monitor treatment. Performed at [facility], [place] [address], [place], [place] [address]      Time coordinating discharge: Over 30  minutes  SIGNED:   Nicolette Bang, MD  [facility] 12/29/2018, 1:34 PM Pager   If 7PM-7AM, please contact night-coverage [email or web address] Password [email or web address]

## 2018-12-29 NOTE — Progress Notes (Signed)
Physical Therapy Treatment Patient Details Name: April [name] MRN: [id number] DOB: 07/22/1956 Today's Date: 12/29/2018    History of Present Illness Pt is a 63 yo female s/p fall resulting in R tibial plateau fx requiring ORIF and L ORIF to tibial fx. PMHx: arthritis and depression.    PT Comments    Pt bilateral bledsoe braces unlocked on entry and pt able to perform AAROM of knees and hips prior to getting up with therapy. Pt requires mod Ax2 for bed mobility and for lateral scoot transfer to drop arm recliner. D/c plans remain appropriate at this time. PT will continue to follow acutely.   Follow Up Recommendations  SNF     Equipment Recommendations  Other (comment)(TBD next venue)       Precautions / Restrictions Precautions Precautions: Knee;Fall Precaution Comments: hinged braces locked in extension at night and unlocked during the day for unrestricted ROM Required Braces or Orthoses: Other Brace(bilateral Hinged knee braces) Restrictions Weight Bearing Restrictions: Yes RLE Weight Bearing: Non weight bearing LLE Weight Bearing: Weight bearing as tolerated(for transfers only)    Mobility  Bed Mobility Overal bed mobility: Needs Assistance Bed Mobility: Supine to Sit Rolling: Mod assist Sidelying to sit: +2 for physical assistance;Mod assist       General bed mobility comments: mod Ax2 for management of LE across bed and for trunk to   Transfers Overall transfer level: Needs assistance Equipment used: Rolling walker (2 wheeled) Transfers: Lateral/Scoot Transfers          Lateral/Scoot Transfers: +2 physical assistance;Mod assist General transfer comment: Pt able to provide increased assistance today for lateral scoot with multiple bumps to get to chair                          Balance Overall balance assessment: Needs assistance Sitting-balance support: Bilateral upper extremity supported Sitting balance-Leahy Scale: Fair Sitting balance -  Comments: improved sitting balance without posterior bias today                                    Cognition Arousal/Alertness: Awake/alert Behavior During Therapy: WFL for tasks assessed/performed Overall Cognitive Status: Within Functional Limits for tasks assessed                                        Exercises General Exercises - Lower Extremity Ankle Circles/Pumps: AROM;Both;10 reps;Supine Long Arc Quad: AROM;Both;5 reps;Seated Heel Slides: AAROM;Both;5 reps;Supine        Pertinent Vitals/Pain Pain Assessment: Faces Faces Pain Scale: Hurts whole lot Pain Location: bilateral legs with mobility  Pain Descriptors / Indicators: Grimacing;Guarding;Sore Pain Intervention(s): Limited activity within patient's tolerance;Monitored during session;Premedicated before session;Repositioned           PT Goals (current goals can now be found in the care plan section) Acute Rehab PT Goals Patient Stated Goal: to get stronger to go home PT Goal Formulation: With patient Time For Goal Achievement: 12/31/18 Potential to Achieve Goals: Good Progress towards PT goals: Progressing toward goals    Frequency    Min 5X/week      PT Plan Current plan remains appropriate       AM-PAC PT "6 Clicks" Mobility   Outcome Measure  Help needed turning from your back to your side while in a flat  bed without using bedrails?: A Lot Help needed moving from lying on your back to sitting on the side of a flat bed without using bedrails?: A Lot Help needed moving to and from a bed to a chair (including a wheelchair)?: Total Help needed standing up from a chair using your arms (e.g., wheelchair or bedside chair)?: Total Help needed to walk in hospital room?: Total Help needed climbing 3-5 steps with a railing? : Total 6 Click Score: 8    End of Session Equipment Utilized During Treatment: Gait belt Activity Tolerance: Patient tolerated treatment well Patient  left: with call bell/phone within reach;in chair;with chair alarm set Nurse Communication: Mobility status;Other (comment)(use of lift for return to bed) PT Visit Diagnosis: Unsteadiness on feet (R26.81);Repeated falls (R29.6)     Time: 7106-2694 PT Time Calculation (min) (ACUTE ONLY): 25 min  Charges:  $Therapeutic Exercise: 8-22 mins $Therapeutic Activity: 8-22 mins                     Tyrez Berrios B. Migdalia Dk PT, DPT Acute Rehabilitation Services Pager [phone] Office [phone]    [name] 12/29/2018, 3:49 PM

## 2018-12-29 NOTE — Plan of Care (Signed)
  Problem: Pain Managment: Goal: General experience of comfort will improve Outcome: Progressing   Problem: Safety: Goal: Ability to remain free from injury will improve Outcome: Progressing   

## 2019-03-31 ENCOUNTER — Emergency Department (HOSPITAL_COMMUNITY): Payer: Medicare Other

## 2019-03-31 ENCOUNTER — Other Ambulatory Visit: Payer: Self-pay

## 2019-03-31 ENCOUNTER — Emergency Department (HOSPITAL_COMMUNITY)
Admission: EM | Admit: 2019-03-31 | Discharge: 2019-03-31 | Disposition: A | Discharge status: Home / Self Care | Payer: Medicare Other | Attending: Emergency Medicine | Admitting: Emergency Medicine

## 2019-03-31 DIAGNOSIS — Z20828 Contact with and (suspected) exposure to other viral communicable diseases: Secondary | ICD-10-CM | POA: Insufficient documentation

## 2019-03-31 DIAGNOSIS — I5032 Chronic diastolic (congestive) heart failure: Secondary | ICD-10-CM | POA: Insufficient documentation

## 2019-03-31 DIAGNOSIS — Z87891 Personal history of nicotine dependence: Secondary | ICD-10-CM | POA: Diagnosis not present

## 2019-03-31 DIAGNOSIS — Z7982 Long term (current) use of aspirin: Secondary | ICD-10-CM | POA: Insufficient documentation

## 2019-03-31 DIAGNOSIS — Z79899 Other long term (current) drug therapy: Secondary | ICD-10-CM | POA: Diagnosis not present

## 2019-03-31 DIAGNOSIS — Z940 Kidney transplant status: Secondary | ICD-10-CM | POA: Diagnosis not present

## 2019-03-31 DIAGNOSIS — R112 Nausea with vomiting, unspecified: Secondary | ICD-10-CM | POA: Insufficient documentation

## 2019-03-31 DIAGNOSIS — N186 End stage renal disease: Secondary | ICD-10-CM | POA: Diagnosis not present

## 2019-03-31 LAB — CBC WITH DIFFERENTIAL/PLATELET
Abs Immature Granulocytes: 0.06 10*3/uL (ref 0.00–0.07)
Basophils Absolute: 0 10*3/uL (ref 0.0–0.1)
Basophils Relative: 0 %
Eosinophils Absolute: 0.1 10*3/uL (ref 0.0–0.5)
Eosinophils Relative: 1 %
HCT: 37 % (ref 36.0–46.0)
Hemoglobin: 10.7 g/dL — ABNORMAL LOW (ref 12.0–15.0)
Immature Granulocytes: 1 %
Lymphocytes Relative: 12 %
Lymphs Abs: 1.6 10*3/uL (ref 0.7–4.0)
MCH: 27.5 pg (ref 26.0–34.0)
MCHC: 28.9 g/dL — ABNORMAL LOW (ref 30.0–36.0)
MCV: 95.1 fL (ref 80.0–100.0)
Monocytes Absolute: 0.6 10*3/uL (ref 0.1–1.0)
Monocytes Relative: 5 %
Neutro Abs: 10.5 10*3/uL — ABNORMAL HIGH (ref 1.7–7.7)
Neutrophils Relative %: 81 %
Platelets: 223 10*3/uL (ref 150–400)
RBC: 3.89 MIL/uL (ref 3.87–5.11)
RDW: 20.1 % — ABNORMAL HIGH (ref 11.5–15.5)
WBC: 12.9 10*3/uL — ABNORMAL HIGH (ref 4.0–10.5)
nRBC: 0 % (ref 0.0–0.2)

## 2019-03-31 LAB — COMPREHENSIVE METABOLIC PANEL
ALT: 31 U/L (ref 0–44)
AST: 27 U/L (ref 15–41)
Albumin: 3.6 g/dL (ref 3.5–5.0)
Alkaline Phosphatase: 149 U/L — ABNORMAL HIGH (ref 38–126)
Anion gap: 9 (ref 5–15)
BUN: 26 mg/dL — ABNORMAL HIGH (ref 8–23)
CO2: 24 mmol/L (ref 22–32)
Calcium: 9.9 mg/dL (ref 8.9–10.3)
Chloride: 99 mmol/L (ref 98–111)
Creatinine, Ser: 0.97 mg/dL (ref 0.44–1.00)
GFR calc Af Amer: >60 mL/min (ref >60)
GFR calc non Af Amer: >60 mL/min (ref >60)
Glucose, Bld: 100 mg/dL — ABNORMAL HIGH (ref 70–99)
Potassium: 4.2 mmol/L (ref 3.5–5.1)
Sodium: 132 mmol/L — ABNORMAL LOW (ref 135–145)
Total Bilirubin: 4.1 mg/dL — ABNORMAL HIGH (ref 0.3–1.2)
Total Protein: 8.3 g/dL — ABNORMAL HIGH (ref 6.5–8.1)

## 2019-03-31 LAB — URINALYSIS, ROUTINE W REFLEX MICROSCOPIC
Glucose, UA: NEGATIVE mg/dL
Ketones, ur: NEGATIVE mg/dL
Leukocytes,Ua: NEGATIVE
Nitrite: NEGATIVE
Protein, ur: 30 mg/dL — AB
Specific Gravity, Urine: <1.005 — ABNORMAL LOW (ref 1.005–1.030)
pH: 6 (ref 5.0–8.0)

## 2019-03-31 LAB — URINALYSIS, MICROSCOPIC (REFLEX)
RBC / HPF: NONE SEEN RBC/hpf (ref 0–5)
Squamous Epithelial / HPF: NONE SEEN (ref 0–5)

## 2019-03-31 LAB — SARS CORONAVIRUS 2 BY RT PCR (HOSPITAL ORDER, PERFORMED IN ~~LOC~~ HOSPITAL LAB): SARS Coronavirus 2: NEGATIVE

## 2019-03-31 LAB — LIPASE, BLOOD: Lipase: 28 U/L (ref 11–51)

## 2019-03-31 LAB — LACTIC ACID, PLASMA: Lactic Acid, Venous: 1 mmol/L (ref 0.5–1.9)

## 2019-03-31 MED ORDER — ACETAMINOPHEN 325 MG PO TABS
650.0000 mg | ORAL_TABLET | Freq: Once | ORAL | Status: AC
  Administered 2019-03-31: 650 mg via ORAL
  Filled 2019-03-31: qty 2

## 2019-03-31 MED ORDER — SODIUM CHLORIDE 0.9 % IV BOLUS
1000.0000 mL | Freq: Once | INTRAVENOUS | Status: AC
  Administered 2019-03-31: 1000 mL via INTRAVENOUS

## 2019-03-31 MED ORDER — ONDANSETRON HCL 4 MG/2ML IJ SOLN
4.0000 mg | Freq: Once | INTRAMUSCULAR | Status: AC
  Administered 2019-03-31: 4 mg via INTRAVENOUS
  Filled 2019-03-31: qty 2

## 2019-03-31 MED ORDER — ONDANSETRON 4 MG PO TBDP: ORAL_TABLET | ORAL | 0 refills | Status: DC

## 2019-03-31 NOTE — ED Notes (Signed)
Bed: JD05 Expected date:  Expected time:  Means of arrival:  Comments: [place] -POV- cough, fever, N/V

## 2019-03-31 NOTE — ED Triage Notes (Signed)
Pt states she began to get sick on Wednesday with headache and low grade fever.  Thursday pt woke up with higher fever and began to have N/V. Pt hx of kidney transplant (2013).

## 2019-03-31 NOTE — ED Provider Notes (Signed)
[facility] DEPT Provider Note   CSN: [id number] Arrival date & time: 03/31/19  1633    History   Chief Complaint Chief Complaint  Patient presents with  . Fever  . Nausea  . Emesis  . Cough  . transplant pt    HPI RAIVYN KABLER is a 63 y.o. female history of hyperlipidemia, reflux, depression, polycystic kidney disease status post renal transplant on tacrolimus, here presenting with nausea, vomiting, headache, low-grade temperature, shortness of breath.  Patient states that for the last 2 days, she has been nauseated and had poor appetite.  She has several episodes of nausea and vomiting as well.  He also has some chills and low-grade temperature.  Moreover she has been having some headaches and subjective shortness of breath.  Patient does have a kidney transplant and is still taking her tacrolimus.  Patient denies any flank pain or abdominal pain.  Patient denies any dysuria or hematuria.  Patient states that she stays at home and has no sick contacts and has no known COVID exposure.      The history is provided by the patient.    Past Medical History:  Diagnosis Date  . Arthritis   . Asthma   . Depression   . GERD (gastroesophageal reflux disease)   . Hyperlipidemia   . Hyperparathyroidism   . Polycystic kidney   . PONV (postoperative nausea and vomiting)     Patient Active Problem List   Diagnosis Date Noted  . Bilateral closed proximal tibial fracture 12/21/2018  . Closed right ankle fracture 12/21/2018  . Fracture 12/21/2018  . Lower urinary tract infectious disease 10/03/2018  . Asthma 10/03/2018  . SIRS (systemic inflammatory response syndrome) ([place]) 10/03/2018  . Nausea vomiting and diarrhea 10/02/2018  . Chronic diastolic CHF (congestive heart failure) ([place]) 10/01/2017  . Influenza B 10/01/2017  . Persistent asthma with status asthmaticus   . Pneumonia 07/15/2016  . Asthma, mild intermittent 07/15/2016  . GERD  (gastroesophageal reflux disease) 07/15/2016  . Renal transplant recipient 07/15/2016  . Hyperlipidemia 07/15/2016  . Depression 07/15/2016  . Bronchitis, mucopurulent recurrent ([place]) 08/08/2014  . Chronic cough 07/24/2014  . End stage renal disease ([place]) 03/23/2012  . 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 BILATERAL OOPHORECTOMY    . CARPAL TUNNEL RELEASE  2000  . CESAREAN SECTION    . INSERTION OF DIALYSIS CATHETER  03/31/2012   Procedure: INSERTION OF DIALYSIS CATHETER;  Surgeon: Angelia Mould, MD;  Location: [facility];  Service: Vascular;  Laterality: N/A;  insertion of dialysis catheter right internal jugular vein  . 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 ORIF TIBIA FRACTURE Left 12/23/2018   Procedure: OPEN REDUCTION INTERNAL FIXATION (ORIF) TIBIA FRACTURE;  Surgeon: Shona Needles, MD;  Location: [facility];  Service: Orthopedics;  Laterality: Left;  . ORIF TIBIA PLATEAU Right 12/23/2018   Procedure: OPEN REDUCTION INTERNAL FIXATION (ORIF) TIBIAL PLATEAU;  Surgeon: Shona Needles, MD;  Location: [facility];  Service: Orthopedics;  Laterality: Right;  . TONSILLECTOMY  1967  . TUBAL LIGATION  2010     OB History   No obstetric history on file.      Home Medications    Prior to Admission medications   Medication Sig Start Date End Date Taking? Authorizing Provider  acetaminophen (TYLENOL) 500  MG tablet Take 1,000 mg by mouth every 6 (six) hours as needed for moderate pain.    [provider]  acyclovir (ZOVIRAX) 400 MG tablet Take 400 mg by mouth 2 (two) times daily as needed (outbreaks).     [provider]  albuterol (PROVENTIL HFA;VENTOLIN HFA) 108 (90 Base) MCG/ACT inhaler Inhale 2 puffs into the lungs every 4 (four) hours as needed for wheezing or shortness of breath. 07/16/16   Ghimire, Henreitta Leber, MD   alendronate (FOSAMAX) 70 MG tablet Take 70 mg by mouth every Friday.  05/26/18   [provider]  allopurinol (ZYLOPRIM) 300 MG tablet Take 300 mg by mouth daily.    [provider]  aspirin EC 81 MG tablet Take 81 mg by mouth daily.    [provider]  brimonidine (ALPHAGAN P) 0.1 % SOLN Place 1 drop 2 (two) times daily into the left eye.    [provider]  budesonide-formoterol (SYMBICORT) 160-4.5 MCG/ACT inhaler Inhale 2 puffs 2 (two) times daily as needed into the lungs (SOB).     [provider]  cholecalciferol (VITAMIN D3) 25 MCG (1000 UT) tablet Take 1,000 Units by mouth every other day.     [provider]  Colchicine 0.6 MG CAPS Take 0.6 mg by mouth daily as needed (gout).     [provider]  cycloSPORINE (RESTASIS) 0.05 % ophthalmic emulsion Place 1 drop into both eyes 2 (two) times daily.    [provider]  enoxaparin (LOVENOX) 40 MG/0.4ML injection Inject 0.4 mLs (40 mg total) into the skin daily for 28 days. 12/30/18 01/27/19  Marcell Anger, MD  FLUoxetine (PROZAC) 40 MG capsule Take 40 mg by mouth daily.    [provider]  furosemide (LASIX) 40 MG tablet Take 1 tablet by mouth daily as needed for edema.  04/20/14   [provider]  HYDROcodone-acetaminophen (NORCO/VICODIN) 5-325 MG tablet Take 1-2 tablets by mouth every 4 (four) hours as needed for moderate pain. 12/29/18   Spongberg, Audie Pinto, MD  latanoprost (XALATAN) 0.005 % ophthalmic solution Place 1 drop into both eyes at bedtime.    [provider]  magnesium oxide (MAG-OX) 400 MG tablet Take 400 mg by mouth 2 (two) times daily.    [provider]  mycophenolate (MYFORTIC) 180 MG EC tablet Take 180 mg by mouth 2 (two) times daily.    [provider]  pantoprazole (PROTONIX) 40 MG tablet Take 40 mg by mouth daily as needed (acid reflux).     [provider]  prednisoLONE acetate (PRED FORTE) 1  % ophthalmic suspension Place 1 drop See admin instructions into both eyes. 1 drop left eye four times daily. 1 drop right eye once daily.    [provider]  predniSONE (DELTASONE) 5 MG tablet Take 2.5-5 mg by mouth See admin instructions. Take 43m by mouth in the morning and 2.54min the evening    [provider]  tacrolimus (PROGRAF) 0.5 MG capsule Take 0.5 mg by mouth daily. Take 0.40m31my mouth daily at 22:00. Pt is taking brand name prograf.    [provider]  TRAVATAN Z 0.004 % SOLN ophthalmic solution Place 1 drop into both eyes at bedtime. 07/03/16   [provider]  vitamin B-12 (CYANOCOBALAMIN) 100 MCG tablet Take 100 mcg by mouth daily.    [provider]    Family History Family History  Problem Relation Age of Onset  . Hypertension Mother   .  Heart disease Father        CABG history  . Polycystic kidney disease Father   . Polycystic kidney disease Brother     Social History Social History   Tobacco Use  . Smoking status: Former Smoker    Packs/day: 0.25    Years: 15.00    Pack years: 3.75    Types: Cigarettes    Last attempt to quit: 03/22/1998    Years since quitting: 21.0  . Smokeless tobacco: Never Used  Substance Use Topics  . Alcohol use: No    Comment: social  . Drug use: No     Allergies   Infed [iron dextran]; Pentamidine; Erythromycin [erythromycin]; Oxycodone; Erythromycin; and Ultram [tramadol hcl]   Review of Systems Review of Systems  Constitutional: Positive for fever.  Respiratory: Positive for cough.   Gastrointestinal: Positive for vomiting.  All other systems reviewed and are negative.    Physical Exam Updated Vital Signs BP (!) 131/57 (BP Location: Right Arm)   Pulse (!) 107   Temp 99 F (37.2 C) (Oral)   Resp 19   Ht 5' 2"  (1.575 m)   Wt 82.6 kg   SpO2 95%   BMI 33.29 kg/m   Physical Exam Vitals signs and nursing note reviewed.  Constitutional:      Comments: Dehydrated    HENT:     Head: Normocephalic.     Right Ear: Tympanic membrane normal.     Left Ear: Tympanic membrane normal.     Nose: Nose normal.     Mouth/Throat:     Mouth: Mucous membranes are dry.  Eyes:     Extraocular Movements: Extraocular movements intact.     Pupils: Pupils are equal, round, and reactive to light.  Neck:     Musculoskeletal: Normal range of motion.  Cardiovascular:     Rate and Rhythm: Normal rate and regular rhythm.     Pulses: Normal pulses.     Heart sounds: Normal heart sounds.  Pulmonary:     Effort: Pulmonary effort is normal.     Breath sounds: Normal breath sounds.  Abdominal:     General: Abdomen is flat.     Palpations: Abdomen is soft.     Comments: Minimal epigastric tenderness. Renal transplant site nontender and no obvious cellulitis   Musculoskeletal: Normal range of motion.  Skin:    General: Skin is warm.     Capillary Refill: Capillary refill takes less than 2 seconds.  Neurological:     General: No focal deficit present.     Mental Status: She is alert and oriented to person, place, and time.  Psychiatric:        Mood and Affect: Mood normal.        Behavior: Behavior normal.      ED Treatments / Results  Labs (all labs ordered are listed, but only abnormal results are displayed) Labs Reviewed  CULTURE, BLOOD (ROUTINE X 2)  CULTURE, BLOOD (ROUTINE X 2)  URINE CULTURE  SARS CORONAVIRUS 2 (HOSPITAL ORDER, [facility])  CBC WITH DIFFERENTIAL/PLATELET  COMPREHENSIVE METABOLIC PANEL  LACTIC ACID, PLASMA  LACTIC ACID, PLASMA  LIPASE, BLOOD  URINALYSIS, ROUTINE W REFLEX MICROSCOPIC    EKG None  Radiology Dg Chest Port 1 View  Result Date: 03/31/2019 CLINICAL DATA:  Fever and cough EXAM: PORTABLE CHEST 1 VIEW COMPARISON:  Chest x-ray dated 12/25/2018 FINDINGS: The cardiac silhouette is enlarged. There is no pneumothorax. No significant pleural effusion. Bibasilar scarring  versus atelectasis is again noted.  No acute osseous abnormality. IMPRESSION: No active disease. Electronically Signed   By: Constance Holster M.D.   On: 03/31/2019 17:19    Procedures Procedures (including critical care time)  Medications Ordered in ED Medications  sodium chloride 0.9 % bolus 1,000 mL (has no administration in time range)  ondansetron (ZOFRAN) injection 4 mg (has no administration in time range)  acetaminophen (TYLENOL) tablet 650 mg (650 mg Oral Given 03/31/19 1714)     Initial Impression / Assessment and Plan / ED Course  I have reviewed the triage vital signs and the nursing notes.  Pertinent labs & imaging results that were available during my care of the patient were reviewed by me and considered in my medical decision making (see chart for details).       ALIEGHA PAULLIN is a 63 y.o. female here with SOB, chills, headaches, vomiting. Low grade temp in the ED. No meningeal signs of suggest meningitis. Minimal epigastric tenderness. Consider sepsis from UTI vs pneumonia vs COVID vs gastroenteritis. Will get labs, lactate, cultures, UA, CXR, COVID.   9:59 PM Labs showed WBC 12. UA showed no UTI. COVID negative. CXR clear. Bili is 4.1, Alk Phos 149. CT showed known polycystic kidney disease in the liver and kidneys. No obvious dilated CBD. Felt better after IVF and zofran. Tolerated PO fluids in the ED. Likely viral gastroenteritis. Will dc home with zofran prn. She will need to follow up with her transplant doctor this week and gave strict return precautions.    Final Clinical Impressions(s) / ED Diagnoses   Final diagnoses:  None    ED Discharge Orders    None       Drenda Freeze, MD 03/31/19 2201

## 2019-03-31 NOTE — ED Notes (Signed)
Patient transported back from CT via stretcher.  

## 2019-03-31 NOTE — Discharge Instructions (Signed)
Stay hydrated   Take zofran for nausea.   Take tylenol for chills or fever.   You were tested negative for coronavirus but the test is not perfect. Please self quarantine per guidelines.   See your transplant doctor in a week   Return to ER if you have fever > 101, vomiting, dehydration, severe abdominal pain, worse headaches, neck pain.       Person Under Monitoring Name: April [name]  Location: [place] [address]   Infection Prevention Recommendations for Individuals Confirmed to have, or Being Evaluated for, 2019 Novel Coronavirus (COVID-19) Infection Who Receive Care at Home  Individuals who are confirmed to have, or are being evaluated for, COVID-19 should follow the prevention steps below until a healthcare provider or local or state health department says they can return to normal activities.  Stay home except to get medical care You should restrict activities outside your home, except for getting medical care. Do not go to work, school, or public areas, and do not use public transportation or taxis.  Call ahead before visiting your doctor Before your medical appointment, call the healthcare provider and tell them that you have, or are being evaluated for, COVID-19 infection. This will help the healthcare providers office take steps to keep other people from getting infected. Ask your healthcare provider to call the local or state health department.  Monitor your symptoms Seek prompt medical attention if your illness is worsening (e.g., difficulty breathing). Before going to your medical appointment, call the healthcare provider and tell them that you have, or are being evaluated for, COVID-19 infection. Ask your healthcare provider to call the local or state health department.  Wear a facemask You should wear a facemask that covers your nose and mouth when you are in the same room with other people and when you visit a healthcare provider.  People who live with or visit you should also wear a facemask while they are in the same room with you.  Separate yourself from other people in your home As much as possible, you should stay in a different room from other people in your home. Also, you should use a separate bathroom, if available.  Avoid sharing household items You should not share dishes, drinking glasses, cups, eating utensils, towels, bedding, or other items with other people in your home. After using these items, you should wash them thoroughly with soap and water.  Cover your coughs and sneezes Cover your mouth and nose with a tissue when you cough or sneeze, or you can cough or sneeze into your sleeve. Throw used tissues in a lined trash can, and immediately wash your hands with soap and water for at least 20 seconds or use an alcohol-based hand rub.  Wash your [facility] your hands often and thoroughly with soap and water for at least 20 seconds. You can use an alcohol-based hand sanitizer if soap and water are not available and if your hands are not visibly dirty. Avoid touching your eyes, nose, and mouth with unwashed hands.   Prevention Steps for Caregivers and Household Members of Individuals Confirmed to have, or Being Evaluated for, COVID-19 Infection Being Cared for in the Home  If you live with, or provide care at home for, a person confirmed to have, or being evaluated for, COVID-19 infection please follow these guidelines to prevent infection:  Follow healthcare providers instructions Make sure that you understand and can help the patient follow any healthcare provider instructions for  all care.  Provide for the patients basic needs You should help the patient with basic needs in the home and provide support for getting groceries, prescriptions, and other personal needs.  Monitor the patients symptoms If they are getting sicker, call his or her medical provider and tell them that the patient  has, or is being evaluated for, COVID-19 infection. This will help the healthcare providers office take steps to keep other people from getting infected. Ask the healthcare provider to call the local or state health department.  Limit the number of people who have contact with the patient If possible, have only one caregiver for the patient. Other household members should stay in another home or place of residence. If this is not possible, they should stay in another room, or be separated from the patient as much as possible. Use a separate bathroom, if available. Restrict visitors who do not have an essential need to be in the home.  Keep older adults, very young children, and other sick people away from the patient Keep older adults, very young children, and those who have compromised immune systems or chronic health conditions away from the patient. This includes people with chronic heart, lung, or kidney conditions, diabetes, and cancer.  Ensure good ventilation Make sure that shared spaces in the home have good air flow, such as from an air conditioner or an opened window, weather permitting.  Wash your hands often Wash your hands often and thoroughly with soap and water for at least 20 seconds. You can use an alcohol based hand sanitizer if soap and water are not available and if your hands are not visibly dirty. Avoid touching your eyes, nose, and mouth with unwashed hands. Use disposable paper towels to dry your hands. If not available, use dedicated cloth towels and replace them when they become wet.  Wear a facemask and gloves Wear a disposable facemask at all times in the room and gloves when you touch or have contact with the patients blood, body fluids, and/or secretions or excretions, such as sweat, saliva, sputum, nasal mucus, vomit, urine, or feces.  Ensure the mask fits over your nose and mouth tightly, and do not touch it during use. Throw out disposable facemasks and  gloves after using them. Do not reuse. Wash your hands immediately after removing your facemask and gloves. If your personal clothing becomes contaminated, carefully remove clothing and launder. Wash your hands after handling contaminated clothing. Place all used disposable facemasks, gloves, and other waste in a lined container before disposing them with other household waste. Remove gloves and wash your hands immediately after handling these items.  Do not share dishes, glasses, or other household items with the patient Avoid sharing household items. You should not share dishes, drinking glasses, cups, eating utensils, towels, bedding, or other items with a patient who is confirmed to have, or being evaluated for, COVID-19 infection. After the person uses these items, you should wash them thoroughly with soap and water.  Wash laundry thoroughly Immediately remove and wash clothes or bedding that have blood, body fluids, and/or secretions or excretions, such as sweat, saliva, sputum, nasal mucus, vomit, urine, or feces, on them. Wear gloves when handling laundry from the patient. Read and follow directions on labels of laundry or clothing items and detergent. In general, wash and dry with the warmest temperatures recommended on the label.  Clean all areas the individual has used often Clean all touchable surfaces, such as counters, tabletops, doorknobs, bathroom fixtures, toilets,  phones, keyboards, tablets, and bedside tables, every day. Also, clean any surfaces that may have blood, body fluids, and/or secretions or excretions on them. Wear gloves when cleaning surfaces the patient has come in contact with. Use a diluted bleach solution (e.g., dilute bleach with 1 part bleach and 10 parts water) or a household disinfectant with a label that says EPA-registered for coronaviruses. To make a bleach solution at home, add 1 tablespoon of bleach to 1 quart (4 cups) of water. For a larger supply, add   cup of bleach to 1 gallon (16 cups) of water. Read labels of cleaning products and follow recommendations provided on product labels. Labels contain instructions for safe and effective use of the cleaning product including precautions you should take when applying the product, such as wearing gloves or eye protection and making sure you have good ventilation during use of the product. Remove gloves and wash hands immediately after cleaning.  Monitor yourself for signs and symptoms of illness Caregivers and household members are considered close contacts, should monitor their health, and will be asked to limit movement outside of the home to the extent possible. Follow the monitoring steps for close contacts listed on the symptom monitoring form.   ? If you have additional questions, contact your local health department or call the epidemiologist on call at [phone] (available 24/7). ? This guidance is subject to change. For the most up-to-date guidance from [facility], please refer to their website: [email or web address]

## 2019-03-31 NOTE — ED Notes (Signed)
pts O2 sats in upper 80's and low 90's.  Pt repositioned and placed on 2 L O2.

## 2019-03-31 NOTE — ED Notes (Signed)
Pt placed on purewick 

## 2019-04-02 LAB — URINE CULTURE: Culture: NO GROWTH

## 2019-04-05 LAB — CULTURE, BLOOD (ROUTINE X 2)
Culture: NO GROWTH
Culture: NO GROWTH
Special Requests: ADEQUATE

## 2019-04-07 IMAGING — CR DG CHEST 2V
2 series · 2 of 2 positions shown · non-contrast
Comparison: 10/01/2017, 04/24/2017

CLINICAL DATA: Short of breath

EXAM:
CHEST  2 VIEW

[chest lat]
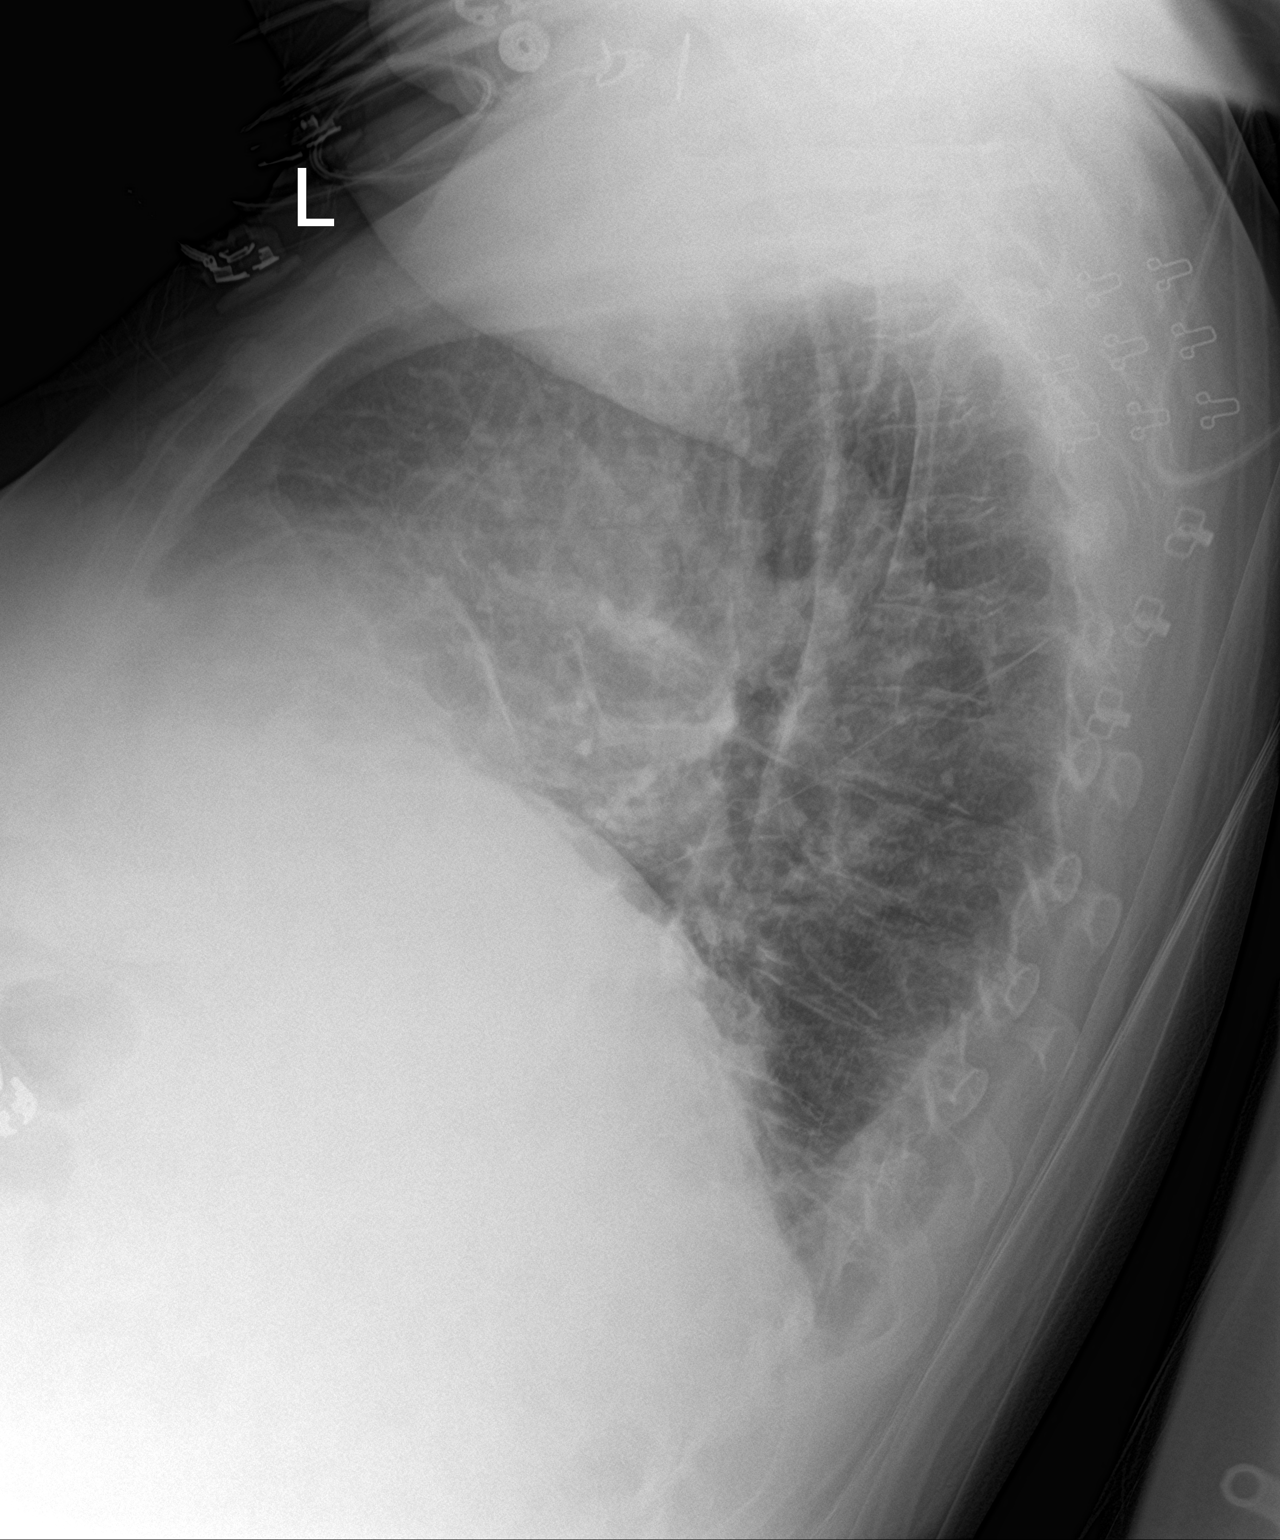

[chest ap]
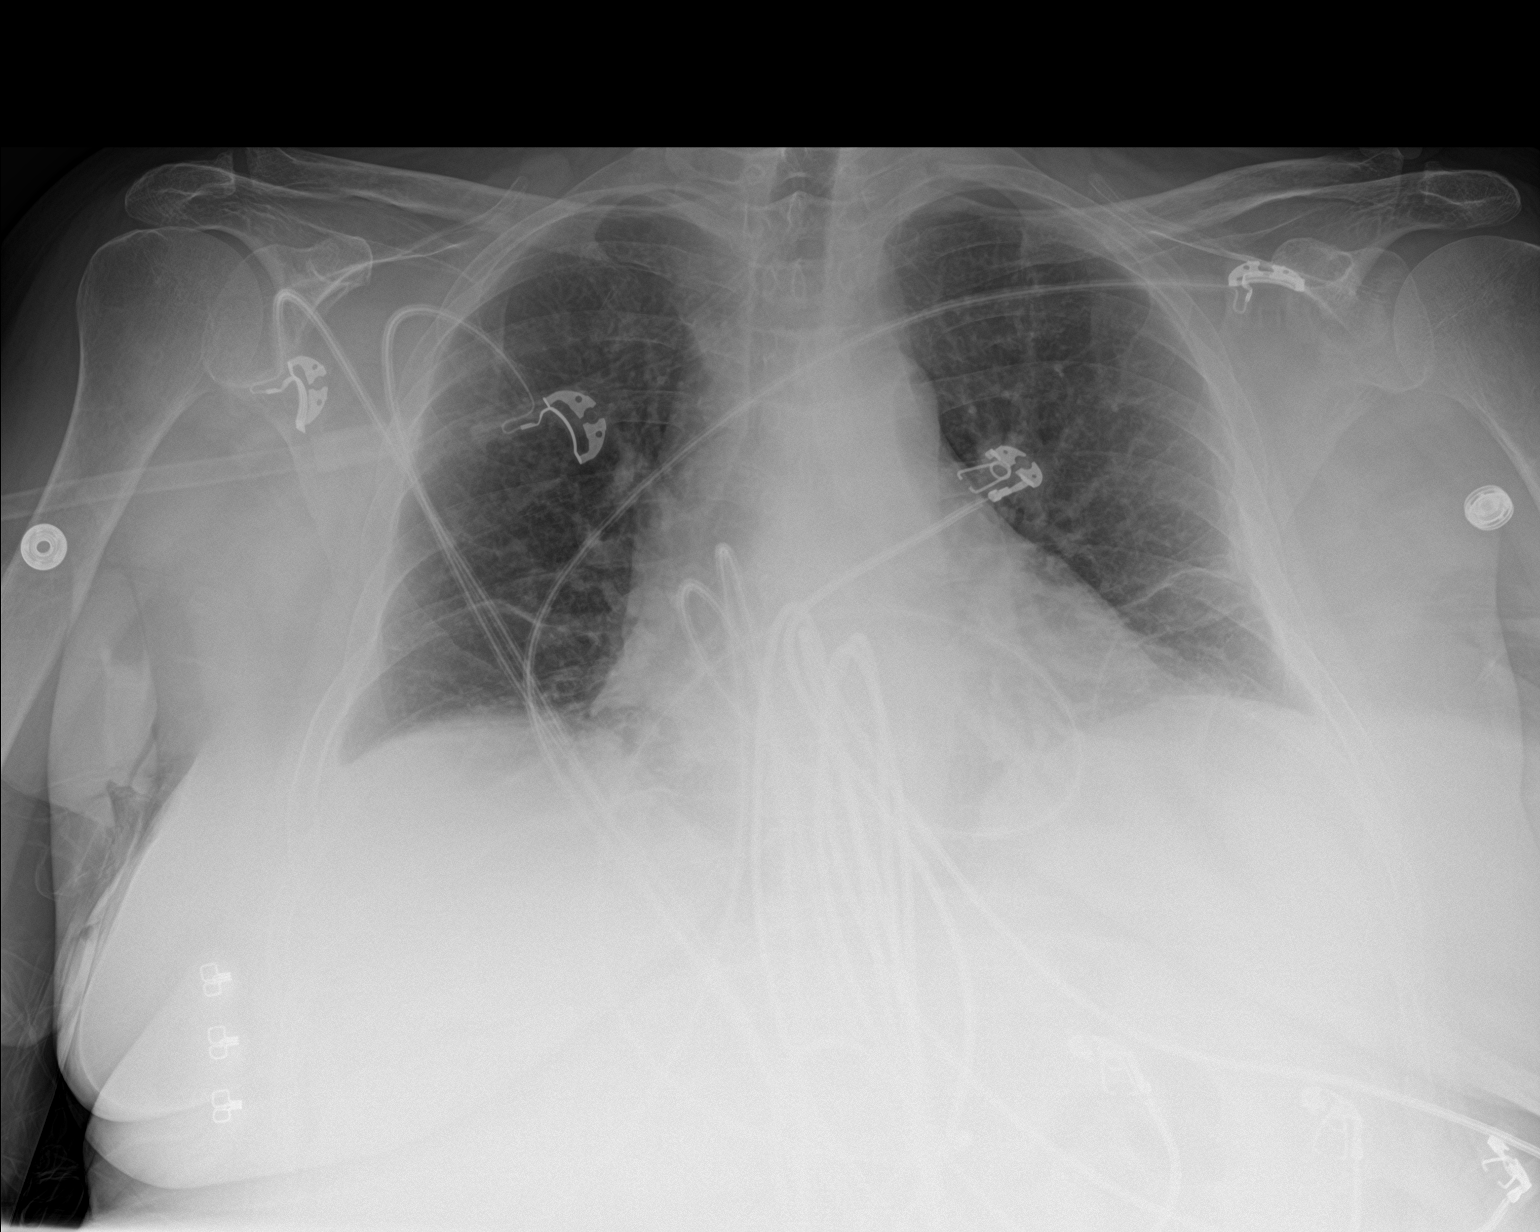

[2 of 2 positions shown; findings below may reference images not displayed]

FINDINGS: Low lung volumes with streaky bibasilar atelectasis. Tiny right
pleural effusion or thickening. Mild cardiomegaly. No pneumothorax.
IMPRESSION: 1. Low lung volumes with streaky bibasilar atelectasis
2. Mild cardiomegaly.

## 2019-04-19 ENCOUNTER — Inpatient Hospital Stay (HOSPITAL_COMMUNITY)
Admission: EM | Admit: 2019-04-19 | Discharge: 2019-04-23 | DRG: 291 | Disposition: A | Discharge status: Home / Self Care | Payer: Medicare Other | Attending: Internal Medicine | Admitting: Internal Medicine

## 2019-04-19 ENCOUNTER — Inpatient Hospital Stay (HOSPITAL_COMMUNITY): Payer: Medicare Other

## 2019-04-19 ENCOUNTER — Emergency Department (HOSPITAL_COMMUNITY): Payer: Medicare Other

## 2019-04-19 ENCOUNTER — Encounter (HOSPITAL_COMMUNITY): Payer: Self-pay

## 2019-04-19 ENCOUNTER — Other Ambulatory Visit: Payer: Self-pay

## 2019-04-19 DIAGNOSIS — Z940 Kidney transplant status: Secondary | ICD-10-CM | POA: Diagnosis not present

## 2019-04-19 DIAGNOSIS — Z8249 Family history of ischemic heart disease and other diseases of the circulatory system: Secondary | ICD-10-CM | POA: Diagnosis not present

## 2019-04-19 DIAGNOSIS — Z888 Allergy status to other drugs, medicaments and biological substances status: Secondary | ICD-10-CM | POA: Diagnosis not present

## 2019-04-19 DIAGNOSIS — J159 Unspecified bacterial pneumonia: Secondary | ICD-10-CM | POA: Diagnosis present

## 2019-04-19 DIAGNOSIS — E785 Hyperlipidemia, unspecified: Secondary | ICD-10-CM | POA: Diagnosis present

## 2019-04-19 DIAGNOSIS — Z7952 Long term (current) use of systemic steroids: Secondary | ICD-10-CM | POA: Diagnosis not present

## 2019-04-19 DIAGNOSIS — Z7982 Long term (current) use of aspirin: Secondary | ICD-10-CM

## 2019-04-19 DIAGNOSIS — E860 Dehydration: Secondary | ICD-10-CM | POA: Diagnosis present

## 2019-04-19 DIAGNOSIS — J9601 Acute respiratory failure with hypoxia: Secondary | ICD-10-CM | POA: Diagnosis present

## 2019-04-19 DIAGNOSIS — Z8271 Family history of polycystic kidney: Secondary | ICD-10-CM

## 2019-04-19 DIAGNOSIS — Z87891 Personal history of nicotine dependence: Secondary | ICD-10-CM

## 2019-04-19 DIAGNOSIS — Z79899 Other long term (current) drug therapy: Secondary | ICD-10-CM | POA: Diagnosis not present

## 2019-04-19 DIAGNOSIS — Z20828 Contact with and (suspected) exposure to other viral communicable diseases: Secondary | ICD-10-CM | POA: Diagnosis present

## 2019-04-19 DIAGNOSIS — I5033 Acute on chronic diastolic (congestive) heart failure: Principal | ICD-10-CM | POA: Diagnosis present

## 2019-04-19 DIAGNOSIS — Z885 Allergy status to narcotic agent status: Secondary | ICD-10-CM

## 2019-04-19 DIAGNOSIS — F329 Major depressive disorder, single episode, unspecified: Secondary | ICD-10-CM | POA: Diagnosis present

## 2019-04-19 DIAGNOSIS — J4521 Mild intermittent asthma with (acute) exacerbation: Secondary | ICD-10-CM | POA: Diagnosis not present

## 2019-04-19 DIAGNOSIS — K219 Gastro-esophageal reflux disease without esophagitis: Secondary | ICD-10-CM | POA: Diagnosis present

## 2019-04-19 DIAGNOSIS — M1990 Unspecified osteoarthritis, unspecified site: Secondary | ICD-10-CM | POA: Diagnosis present

## 2019-04-19 DIAGNOSIS — R110 Nausea: Secondary | ICD-10-CM | POA: Diagnosis not present

## 2019-04-19 DIAGNOSIS — M109 Gout, unspecified: Secondary | ICD-10-CM | POA: Diagnosis present

## 2019-04-19 DIAGNOSIS — I509 Heart failure, unspecified: Secondary | ICD-10-CM

## 2019-04-19 DIAGNOSIS — R0602 Shortness of breath: Secondary | ICD-10-CM

## 2019-04-19 DIAGNOSIS — J4520 Mild intermittent asthma, uncomplicated: Secondary | ICD-10-CM | POA: Diagnosis present

## 2019-04-19 DIAGNOSIS — Z881 Allergy status to other antibiotic agents status: Secondary | ICD-10-CM

## 2019-04-19 LAB — COMPREHENSIVE METABOLIC PANEL
ALT: 21 U/L (ref 0–44)
AST: 22 U/L (ref 15–41)
Albumin: 3.2 g/dL — ABNORMAL LOW (ref 3.5–5.0)
Alkaline Phosphatase: 114 U/L (ref 38–126)
Anion gap: 10 (ref 5–15)
BUN: 25 mg/dL — ABNORMAL HIGH (ref 8–23)
CO2: 24 mmol/L (ref 22–32)
Calcium: 9.5 mg/dL (ref 8.9–10.3)
Chloride: 97 mmol/L — ABNORMAL LOW (ref 98–111)
Creatinine, Ser: 1.05 mg/dL — ABNORMAL HIGH (ref 0.44–1.00)
GFR calc Af Amer: >60 mL/min (ref >60)
GFR calc non Af Amer: 57 mL/min — ABNORMAL LOW (ref >60)
Glucose, Bld: 111 mg/dL — ABNORMAL HIGH (ref 70–99)
Potassium: 4.2 mmol/L (ref 3.5–5.1)
Sodium: 131 mmol/L — ABNORMAL LOW (ref 135–145)
Total Bilirubin: 3.9 mg/dL — ABNORMAL HIGH (ref 0.3–1.2)
Total Protein: 7.6 g/dL (ref 6.5–8.1)

## 2019-04-19 LAB — BLOOD GAS, ARTERIAL
Acid-Base Excess: 0.2 mmol/L (ref 0.0–2.0)
Bicarbonate: 24.7 mmol/L (ref 20.0–28.0)
Drawn by: 560031
O2 Content: 2 L/min
O2 Saturation: 99.1 %
Patient temperature: 98.6
pCO2 arterial: 41.9 mmHg (ref 32.0–48.0)
pH, Arterial: 7.388 (ref 7.350–7.450)
pO2, Arterial: 153 mmHg — ABNORMAL HIGH (ref 83.0–108.0)

## 2019-04-19 LAB — CBC WITH DIFFERENTIAL/PLATELET
Abs Immature Granulocytes: 0.13 10*3/uL — ABNORMAL HIGH (ref 0.00–0.07)
Basophils Absolute: 0 10*3/uL (ref 0.0–0.1)
Basophils Relative: 0 %
Eosinophils Absolute: 0.1 10*3/uL (ref 0.0–0.5)
Eosinophils Relative: 0 %
HCT: 36 % (ref 36.0–46.0)
Hemoglobin: 10.8 g/dL — ABNORMAL LOW (ref 12.0–15.0)
Immature Granulocytes: 1 %
Lymphocytes Relative: 8 %
Lymphs Abs: 1.5 10*3/uL (ref 0.7–4.0)
MCH: 28.3 pg (ref 26.0–34.0)
MCHC: 30 g/dL (ref 30.0–36.0)
MCV: 94.5 fL (ref 80.0–100.0)
Monocytes Absolute: 0.8 10*3/uL (ref 0.1–1.0)
Monocytes Relative: 4 %
Neutro Abs: 15.6 10*3/uL — ABNORMAL HIGH (ref 1.7–7.7)
Neutrophils Relative %: 87 %
Platelets: 178 10*3/uL (ref 150–400)
RBC: 3.81 MIL/uL — ABNORMAL LOW (ref 3.87–5.11)
RDW: 19.9 % — ABNORMAL HIGH (ref 11.5–15.5)
WBC: 18.1 10*3/uL — ABNORMAL HIGH (ref 4.0–10.5)
nRBC: 0 % (ref 0.0–0.2)

## 2019-04-19 LAB — SARS CORONAVIRUS 2 BY RT PCR (HOSPITAL ORDER, PERFORMED IN ~~LOC~~ HOSPITAL LAB): SARS Coronavirus 2: NEGATIVE

## 2019-04-19 LAB — PROCALCITONIN: Procalcitonin: 0.82 ng/mL

## 2019-04-19 LAB — URINALYSIS, ROUTINE W REFLEX MICROSCOPIC
Bilirubin Urine: NEGATIVE
Glucose, UA: NEGATIVE mg/dL
Ketones, ur: NEGATIVE mg/dL
Nitrite: NEGATIVE
Protein, ur: 100 mg/dL — AB
Specific Gravity, Urine: 1.009 (ref 1.005–1.030)
pH: 5 (ref 5.0–8.0)

## 2019-04-19 LAB — PROTIME-INR
INR: 1.2 (ref 0.8–1.2)
Prothrombin Time: 15.4 seconds — ABNORMAL HIGH (ref 11.4–15.2)

## 2019-04-19 LAB — D-DIMER, QUANTITATIVE: D-Dimer, Quant: 1.77 ug/mL-FEU — ABNORMAL HIGH (ref 0.00–0.50)

## 2019-04-19 LAB — MRSA PCR SCREENING: MRSA by PCR: NEGATIVE

## 2019-04-19 LAB — FERRITIN: Ferritin: 168 ng/mL (ref 11–307)

## 2019-04-19 LAB — LACTIC ACID, PLASMA: Lactic Acid, Venous: 1 mmol/L (ref 0.5–1.9)

## 2019-04-19 LAB — ABO/RH: ABO/RH(D): A NEG

## 2019-04-19 LAB — C-REACTIVE PROTEIN: CRP: 28.4 mg/dL — ABNORMAL HIGH (ref <1.0)

## 2019-04-19 LAB — FIBRINOGEN: Fibrinogen: >800 mg/dL — ABNORMAL HIGH (ref 210–475)

## 2019-04-19 LAB — TROPONIN I: Troponin I: 0.03 ng/mL (ref <0.03)

## 2019-04-19 LAB — LIPASE, BLOOD: Lipase: 22 U/L (ref 11–51)

## 2019-04-19 LAB — BRAIN NATRIURETIC PEPTIDE: B Natriuretic Peptide: 1089.2 pg/mL — ABNORMAL HIGH (ref 0.0–100.0)

## 2019-04-19 MED ORDER — ALLOPURINOL 300 MG PO TABS
300.0000 mg | ORAL_TABLET | Freq: Every day | ORAL | Status: DC
  Administered 2019-04-19 – 2019-04-23 (×5): 300 mg via ORAL
  Filled 2019-04-19 (×2): qty 1
  Filled 2019-04-19 (×3): qty 3

## 2019-04-19 MED ORDER — CYCLOSPORINE 0.05 % OP EMUL
1.0000 [drp] | Freq: Two times a day (BID) | OPHTHALMIC | Status: DC
  Administered 2019-04-19 – 2019-04-23 (×8): 1 [drp] via OPHTHALMIC
  Filled 2019-04-19 (×8): qty 30

## 2019-04-19 MED ORDER — ONDANSETRON HCL 4 MG/2ML IJ SOLN: 4.0000 mg | Freq: Four times a day (QID) | INTRAMUSCULAR | Status: DC | PRN

## 2019-04-19 MED ORDER — TACROLIMUS 0.5 MG PO CAPS
0.5000 mg | ORAL_CAPSULE | Freq: Every day | ORAL | Status: DC
  Administered 2019-04-19 – 2019-04-22 (×4): 0.5 mg via ORAL
  Filled 2019-04-19 (×5): qty 1

## 2019-04-19 MED ORDER — ACETAMINOPHEN 325 MG PO TABS
650.0000 mg | ORAL_TABLET | Freq: Four times a day (QID) | ORAL | Status: DC | PRN
  Administered 2019-04-20 – 2019-04-21 (×4): 650 mg via ORAL
  Filled 2019-04-19 (×4): qty 2

## 2019-04-19 MED ORDER — TACROLIMUS 1 MG PO CAPS
1.0000 mg | ORAL_CAPSULE | Freq: Every day | ORAL | Status: DC
  Administered 2019-04-20 – 2019-04-23 (×4): 1 mg via ORAL
  Filled 2019-04-19 (×4): qty 1

## 2019-04-19 MED ORDER — LATANOPROST 0.005 % OP SOLN
1.0000 [drp] | Freq: Every day | OPHTHALMIC | Status: DC
  Administered 2019-04-19 – 2019-04-22 (×4): 1 [drp] via OPHTHALMIC
  Filled 2019-04-19: qty 2.5

## 2019-04-19 MED ORDER — ACETAMINOPHEN 650 MG RE SUPP: 650.0000 mg | Freq: Four times a day (QID) | RECTAL | Status: DC | PRN

## 2019-04-19 MED ORDER — MYCOPHENOLATE SODIUM 180 MG PO TBEC
180.0000 mg | DELAYED_RELEASE_TABLET | Freq: Two times a day (BID) | ORAL | Status: DC
  Administered 2019-04-19 – 2019-04-23 (×8): 180 mg via ORAL
  Filled 2019-04-19 (×8): qty 1

## 2019-04-19 MED ORDER — MAGNESIUM OXIDE 400 (241.3 MG) MG PO TABS
400.0000 mg | ORAL_TABLET | Freq: Two times a day (BID) | ORAL | Status: DC
  Administered 2019-04-19 – 2019-04-23 (×8): 400 mg via ORAL
  Filled 2019-04-19 (×8): qty 1

## 2019-04-19 MED ORDER — ALBUTEROL SULFATE HFA 108 (90 BASE) MCG/ACT IN AERS
4.0000 | INHALATION_SPRAY | Freq: Once | RESPIRATORY_TRACT | Status: AC
  Administered 2019-04-19: 4 via RESPIRATORY_TRACT
  Filled 2019-04-19: qty 6.7

## 2019-04-19 MED ORDER — MOMETASONE FURO-FORMOTEROL FUM 100-5 MCG/ACT IN AERO
2.0000 | INHALATION_SPRAY | Freq: Two times a day (BID) | RESPIRATORY_TRACT | Status: DC
  Administered 2019-04-19 – 2019-04-23 (×8): 2 via RESPIRATORY_TRACT
  Filled 2019-04-19: qty 8.8

## 2019-04-19 MED ORDER — PREDNISOLONE ACETATE 1 % OP SUSP
1.0000 [drp] | Freq: Three times a day (TID) | OPHTHALMIC | Status: DC
  Administered 2019-04-19 – 2019-04-23 (×14): 1 [drp] via OPHTHALMIC
  Filled 2019-04-19: qty 5

## 2019-04-19 MED ORDER — FLUOXETINE HCL 20 MG PO CAPS
40.0000 mg | ORAL_CAPSULE | Freq: Every day | ORAL | Status: DC
  Administered 2019-04-19 – 2019-04-23 (×5): 40 mg via ORAL
  Filled 2019-04-19 (×5): qty 2

## 2019-04-19 MED ORDER — SODIUM CHLORIDE 0.9 % IV SOLN
2.0000 g | Freq: Two times a day (BID) | INTRAVENOUS | Status: DC
  Administered 2019-04-20 – 2019-04-22 (×5): 2 g via INTRAVENOUS
  Filled 2019-04-19 (×6): qty 2

## 2019-04-19 MED ORDER — PREDNISONE 5 MG PO TABS
7.5000 mg | ORAL_TABLET | Freq: Every day | ORAL | Status: DC
  Administered 2019-04-20 – 2019-04-21 (×2): 7.5 mg via ORAL
  Filled 2019-04-19 (×2): qty 2

## 2019-04-19 MED ORDER — METOCLOPRAMIDE HCL 5 MG/ML IJ SOLN
10.0000 mg | Freq: Once | INTRAMUSCULAR | Status: AC
  Administered 2019-04-19: 10 mg via INTRAVENOUS
  Filled 2019-04-19: qty 2

## 2019-04-19 MED ORDER — PANTOPRAZOLE SODIUM 40 MG PO TBEC
40.0000 mg | DELAYED_RELEASE_TABLET | Freq: Every day | ORAL | Status: DC
  Administered 2019-04-19 – 2019-04-23 (×5): 40 mg via ORAL
  Filled 2019-04-19 (×5): qty 1

## 2019-04-19 MED ORDER — HYDRALAZINE HCL 20 MG/ML IJ SOLN
10.0000 mg | Freq: Once | INTRAMUSCULAR | Status: AC
  Administered 2019-04-19: 10 mg via INTRAVENOUS
  Filled 2019-04-19: qty 1

## 2019-04-19 MED ORDER — ASPIRIN EC 81 MG PO TBEC
81.0000 mg | DELAYED_RELEASE_TABLET | Freq: Every day | ORAL | Status: DC
  Administered 2019-04-19 – 2019-04-23 (×5): 81 mg via ORAL
  Filled 2019-04-19 (×5): qty 1

## 2019-04-19 MED ORDER — CYCLOBENZAPRINE HCL 5 MG PO TABS
5.0000 mg | ORAL_TABLET | Freq: Three times a day (TID) | ORAL | Status: DC | PRN
  Administered 2019-04-20 – 2019-04-21 (×3): 5 mg via ORAL
  Filled 2019-04-19 (×3): qty 1

## 2019-04-19 MED ORDER — ENOXAPARIN SODIUM 40 MG/0.4ML ~~LOC~~ SOLN
40.0000 mg | SUBCUTANEOUS | Status: DC
  Administered 2019-04-19 – 2019-04-22 (×4): 40 mg via SUBCUTANEOUS
  Filled 2019-04-19 (×4): qty 0.4

## 2019-04-19 MED ORDER — SODIUM CHLORIDE 0.9 % IV SOLN
2.0000 g | Freq: Once | INTRAVENOUS | Status: AC
  Administered 2019-04-19: 2 g via INTRAVENOUS
  Filled 2019-04-19: qty 2

## 2019-04-19 MED ORDER — SODIUM CHLORIDE 0.9% FLUSH: 3.0000 mL | INTRAVENOUS | Status: DC | PRN

## 2019-04-19 MED ORDER — DIPHENHYDRAMINE HCL 50 MG/ML IJ SOLN
25.0000 mg | Freq: Once | INTRAMUSCULAR | Status: AC
  Administered 2019-04-19: 20:00:00 25 mg via INTRAVENOUS
  Filled 2019-04-19: qty 1

## 2019-04-19 MED ORDER — PREDNISOLONE ACETATE 1 % OP SUSP
1.0000 [drp] | Freq: Every day | OPHTHALMIC | Status: DC
  Administered 2019-04-20 – 2019-04-23 (×4): 1 [drp] via OPHTHALMIC

## 2019-04-19 MED ORDER — SODIUM CHLORIDE 0.9 % IV SOLN: 250.0000 mL | INTRAVENOUS | Status: DC | PRN

## 2019-04-19 MED ORDER — FUROSEMIDE 10 MG/ML IJ SOLN
40.0000 mg | Freq: Once | INTRAMUSCULAR | Status: AC
  Administered 2019-04-19: 14:00:00 40 mg via INTRAVENOUS
  Filled 2019-04-19: qty 4

## 2019-04-19 MED ORDER — FUROSEMIDE 10 MG/ML IJ SOLN
40.0000 mg | Freq: Every day | INTRAMUSCULAR | Status: DC
  Administered 2019-04-20: 40 mg via INTRAVENOUS
  Filled 2019-04-19: qty 4

## 2019-04-19 MED ORDER — ONDANSETRON HCL 4 MG/2ML IJ SOLN
4.0000 mg | Freq: Once | INTRAMUSCULAR | Status: AC
  Administered 2019-04-19: 4 mg via INTRAVENOUS
  Filled 2019-04-19: qty 2

## 2019-04-19 MED ORDER — BRIMONIDINE TARTRATE 0.15 % OP SOLN
1.0000 [drp] | Freq: Two times a day (BID) | OPHTHALMIC | Status: DC
  Administered 2019-04-19 – 2019-04-23 (×8): 1 [drp] via OPHTHALMIC
  Filled 2019-04-19: qty 5

## 2019-04-19 MED ORDER — SODIUM CHLORIDE 0.9% FLUSH
3.0000 mL | Freq: Two times a day (BID) | INTRAVENOUS | Status: DC
  Administered 2019-04-19 – 2019-04-23 (×5): 3 mL via INTRAVENOUS

## 2019-04-19 NOTE — ED Triage Notes (Signed)
Patient c/o headache x 6 days and emesis x 3 days ago.

## 2019-04-19 NOTE — ED Notes (Signed)
ED TO INPATIENT HANDOFF REPORT  ED Nurse Name and Phone #: Tana Coast Name/Age/Gender April [name] 63 y.o. female Room/Bed: WA10/WA10  Code Status   Code Status: Prior  Home/SNF/Other Home Patient oriented to: self, place, time and situation Is this baseline? Yes   Triage Complete: Triage complete  Chief Complaint chest pain 2 days  Triage Note Patient c/o headache x 6 days and emesis x 3 days ago.   Allergies Allergies  Allergen Reactions  . Infed [Iron Dextran] Other (See Comments)    Chest tightness  . Pentamidine Itching, Shortness Of Breath and Swelling  . Erythromycin [Erythromycin] Other (See Comments)    Mouth Ulcers  . Oxycodone Nausea Only and Nausea And Vomiting  . Erythromycin Rash    Causes breakout in mouth  . Ultram [Tramadol Hcl] Anxiety    Level of Care/Admitting Diagnosis ED Disposition    ED Disposition Condition Comment   Admit  Hospital Area: [name] [[id number]]  Level of Care: Stepdown [14]  Admit to SDU based on following criteria: Respiratory Distress:  Frequent assessment and/or intervention to maintain adequate ventilation/respiration, pulmonary toilet, and respiratory treatment.  Admit to SDU based on following criteria: Cardiac Instability:  Patients experiencing chest pain, unconfirmed MI and stable, arrhythmias and CHF requiring medical management and potentially compromising patient's stability  Covid Evaluation: Person Under Investigation (PUI)  Isolation Risk Level: Low Risk/Droplet (Less than 4L [place] supplementation)  Diagnosis: Acute respiratory failure with hypoxia [facility]) [[id number]]  Admitting Physician: Mariel Aloe [phone]  Attending Physician: Mariel Aloe [phone]  Estimated length of stay: past midnight tomorrow  Certification:: [name] will need inpatient services for at least 2 midnights  PT Class (Do Not Modify): Inpatient [101]  PT Acc Code (Do Not Modify): Private [1]        B Medical/Surgery History Past Medical History:  Diagnosis Date  . Arthritis   . Asthma   . Depression   . GERD (gastroesophageal reflux disease)   . Hyperlipidemia   . Hyperparathyroidism   . Polycystic kidney   . PONV (postoperative nausea and vomiting)    Past Surgical History:  Procedure Laterality Date  . 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 BILATERAL OOPHORECTOMY    . CARPAL TUNNEL RELEASE  2000  . CESAREAN SECTION    . INSERTION OF DIALYSIS CATHETER  03/31/2012   Procedure: INSERTION OF DIALYSIS CATHETER;  Surgeon: Angelia Mould, MD;  Location: [place];  Service: Vascular;  Laterality: N/A;  insertion of dialysis catheter right internal jugular vein  . 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 ORIF TIBIA FRACTURE Left 12/23/2018   Procedure: OPEN REDUCTION INTERNAL FIXATION (ORIF) TIBIA FRACTURE;  Surgeon: Shona Needles, MD;  Location: [place];  Service: Orthopedics;  Laterality: Left;  . ORIF TIBIA PLATEAU Right 12/23/2018   Procedure: OPEN REDUCTION INTERNAL FIXATION (ORIF) TIBIAL PLATEAU;  Surgeon: Shona Needles, MD;  Location: [place];  Service: Orthopedics;  Laterality: Right;  . TONSILLECTOMY  1967  . TUBAL LIGATION  2010     A IV Location/Drains/Wounds Patient Lines/Drains/Airways Status   Active Line/Drains/Airways    Name:   Placement date:   Placement time:   Site:   Days:   Peripheral IV 04/19/19 Right Antecubital   04/19/19    1247    Antecubital   less than 1   Vascular Access Left Upper arm Arteriovenous fistula   -    -  Upper arm      Incision (Closed) 12/23/18 Leg Right   12/23/18    0949     117   Incision (Closed) 12/23/18 Pretibial Left   12/23/18    1032     117          Intake/Output Last 24 hours No intake or output data in the 24 hours ending 04/19/19 1654  Labs/Imaging Results for orders placed or performed during the hospital encounter of 04/19/19 (from the past 48 hour(s))  SARS Coronavirus 2 (CEPHEID-  Performed in [place] hospital lab), Hosp Order     Status: None   Collection Time: 04/19/19 12:14 PM  Result Value Ref Range   SARS Coronavirus 2 NEGATIVE NEGATIVE    Comment: (NOTE) If result is NEGATIVE SARS-CoV-2 target nucleic acids are NOT DETECTED. The SARS-CoV-2 RNA is generally detectable in upper and lower  respiratory specimens during the acute phase of infection. The lowest  concentration of SARS-CoV-2 viral copies this assay can detect is 250  copies / mL. A negative result does not preclude SARS-CoV-2 infection  and should not be used as the sole basis for treatment or other  patient management decisions.  A negative result may occur with  improper specimen collection / handling, submission of specimen other  than nasopharyngeal swab, presence of viral mutation(s) within the  areas targeted by this assay, and inadequate number of viral copies  (<250 copies / mL). A negative result must be combined with clinical  observations, patient history, and epidemiological information. If result is POSITIVE SARS-CoV-2 target nucleic acids are DETECTED. The SARS-CoV-2 RNA is generally detectable in upper and lower  respiratory specimens dur ing the acute phase of infection.  Positive  results are indicative of active infection with SARS-CoV-2.  Clinical  correlation with patient history and other diagnostic information is  necessary to determine patient infection status.  Positive results do  not rule out bacterial infection or co-infection with other viruses. If result is PRESUMPTIVE POSTIVE SARS-CoV-2 nucleic acids MAY BE PRESENT.   A presumptive positive result was obtained on the submitted specimen  and confirmed on repeat testing.  While 2019 novel coronavirus  (SARS-CoV-2) nucleic acids may be present in the submitted sample  additional confirmatory testing may be necessary for epidemiological  and / or clinical management purposes  to differentiate between  SARS-CoV-2  and other Sarbecovirus currently known to infect humans.  If clinically indicated additional testing with an alternate test  methodology [phone]) is advised. The SARS-CoV-2 RNA is generally  detectable in upper and lower respiratory sp ecimens during the acute  phase of infection. The expected result is Negative. Fact Sheet for Patients:  StrictlyIdeas.no Fact Sheet for Healthcare Providers: BankingDealers.co.za This test is not yet approved or cleared by the [place] FDA and has been authorized for detection and/or diagnosis of SARS-CoV-2 by FDA under an Emergency Use Authorization (EUA).  This EUA will remain in effect (meaning this test can be used) for the duration of the COVID-19 declaration under Section 564(b)(1) of the Act, 21 U.S.C. section 360bbb-3(b)(1), unless the authorization is terminated or revoked sooner. Performed at [facility], [place] [address], [place],  [address]   Comprehensive metabolic panel     Status: Abnormal   Collection Time: 04/19/19 12:43 PM  Result Value Ref Range   Sodium 131 (L) 135 - 145 mmol/L   Potassium 4.2 3.5 - 5.1 mmol/L   Chloride 97 (L) 98 - 111 mmol/L  CO2 24 22 - 32 mmol/L   Glucose, Bld 111 (H) 70 - 99 mg/dL   BUN 25 (H) 8 - 23 mg/dL   Creatinine, Ser 1.05 (H) 0.44 - 1.00 mg/dL   Calcium 9.5 8.9 - 10.3 mg/dL   Total Protein 7.6 6.5 - 8.1 g/dL   Albumin 3.2 (L) 3.5 - 5.0 g/dL   AST 22 15 - 41 U/L   ALT 21 0 - 44 U/L   Alkaline Phosphatase 114 38 - 126 U/L   Total Bilirubin 3.9 (H) 0.3 - 1.2 mg/dL   GFR calc non Af Amer 57 (L) >60 mL/min   GFR calc Af Amer >60 >60 mL/min   Anion gap 10 5 - 15    Comment: Performed at [facility], [place] [address] [place], [place] [address]  Lipase, blood     Status: None   Collection Time: 04/19/19 12:43 PM  Result Value Ref Range   Lipase 22 11 - 51 U/L    Comment: Performed at [facility], [place] [address], [place], [place] [address]  Brain natriuretic peptide     Status: Abnormal   Collection Time: 04/19/19 12:44 PM  Result Value Ref Range   B Natriuretic Peptide 1,089.2 (H) 0.0 - 100.0 pg/mL    Comment: Performed at [facility], [place] [address], [place], [place] [address]  Troponin I - Once     Status: Abnormal   Collection Time: 04/19/19 12:44 PM  Result Value Ref Range   Troponin I 0.03 (HH) <0.03 ng/mL    Comment: CRITICAL RESULT CALLED TO, READ BACK BY AND VERIFIED WITH: S.BINGHAM RN AT 1323 ON 04/19/2019 BY S.VANHOORNE Performed at [place] [address] [place], [place] [address]   Lactic acid, plasma     Status: None   Collection Time: 04/19/19 12:44 PM  Result Value Ref Range   Lactic Acid, Venous 1.0 0.5 - 1.9 mmol/L    Comment: Performed at [facility], [place] [address], [place], [place] [address]  CBC with Differential     Status: Abnormal   Collection Time: 04/19/19 12:44 PM  Result Value Ref Range   WBC 18.1 (H) 4.0 - 10.5 K/uL   RBC 3.81 (L) 3.87 - 5.11 MIL/uL   Hemoglobin 10.8 (L) 12.0 - 15.0 g/dL   HCT 36.0 36.0 - 46.0 %   MCV 94.5 80.0 - 100.0 fL   MCH 28.3 26.0 - 34.0 pg   MCHC 30.0 30.0 - 36.0 g/dL   RDW 19.9 (H) 11.5 - 15.5 %   Platelets 178 150 - 400 K/uL   nRBC 0.0 0.0 - 0.2 %   Neutrophils Relative % 87 %   Neutro Abs 15.6 (H) 1.7 - 7.7 K/uL   Lymphocytes Relative 8 %   Lymphs Abs 1.5 0.7 - 4.0 K/uL   Monocytes Relative 4 %   Monocytes Absolute 0.8 0.1 - 1.0 K/uL   Eosinophils Relative 0 %   Eosinophils Absolute 0.1 0.0 - 0.5 K/uL   Basophils Relative 0 %   Basophils Absolute 0.0 0.0 - 0.1 K/uL   WBC Morphology VACUOLATED NEUTROPHILS    Immature Granulocytes 1 %   Abs Immature Granulocytes 0.13 (H) 0.00 - 0.07 K/uL   Polychromasia PRESENT     Comment: Performed at [facility], [place] [address] [place], [place] [address]  Protime-INR      Status: Abnormal   Collection Time: 04/19/19 12:44 PM  Result Value Ref Range   Prothrombin Time 15.4 (H) 11.4 - 15.2 seconds   INR 1.2 0.8 - 1.2    Comment: (NOTE) INR goal varies based on device and disease states. Performed at [facility], [place] [address], [place], [place] [address]   Blood gas, arterial     Status: Abnormal   Collection Time: 04/19/19 12:58 PM  Result Value Ref Range   O2 Content 2.0 L/min   pH, Arterial 7.388 7.350 - 7.450   pCO2 arterial 41.9 32.0 - 48.0 mmHg   pO2, Arterial 153 (H) 83.0 - 108.0 mmHg   Bicarbonate 24.7 20.0 - 28.0 mmol/L   Acid-Base Excess 0.2 0.0 - 2.0 mmol/L   O2 Saturation 99.1 %   Patient temperature 98.6    Collection site RIGHT RADIAL    Drawn by [id number]    Sample type ARTERIAL DRAW    Allens test (pass/fail) PASS PASS    Comment: Performed at [facility], [place] [address] [place], [place] [address]   Ct Head Wo Contrast  Result Date: 04/19/2019 CLINICAL DATA:  Headache EXAM: CT HEAD WITHOUT CONTRAST TECHNIQUE: Contiguous axial images were obtained from the base of the skull through the vertex without intravenous contrast. COMPARISON:  CT head dated 12/27/2018 FINDINGS: Brain: No evidence of acute infarction, hemorrhage, hydrocephalus, extra-axial collection or mass lesion/mass effect. Vascular: No hyperdense vessel or unexpected calcification. Skull: Normal. Negative for fracture or focal lesion. Sinuses/Orbits: No acute finding. The patient is status post bilateral cataract surgery. Other: None. IMPRESSION: No acute intracranial abnormality detected. Electronically Signed   By: Constance Holster M.D.   On: 04/19/2019 15:17   Dg Abd Acute W/chest  Result Date: 04/19/2019 CLINICAL DATA:  Cough, fever, shortness of breath, and vomiting. EXAM: DG ABDOMEN ACUTE W/ 1V CHEST COMPARISON:  CT abdomen pelvis dated Mar 31, 2019. Chest x-ray dated Mar 31, 2019. FINDINGS: The cardiomediastinal silhouette is  normal in size. New mild pulmonary vascular congestion. No focal consolidation, pleural effusion, or pneumothorax. Unchanged linear bibasilar atelectasis/scarring. No acute osseous abnormality. There is no evidence of dilated bowel loops or free intraperitoneal air. No radiopaque calculi or other significant radiographic abnormality is seen. Unchanged splenomegaly. IMPRESSION: 1. No acute abnormality in the abdomen. 2. Unchanged splenomegaly. 3. New mild pulmonary vascular congestion. Electronically Signed   By: Titus Dubin M.D.   On: 04/19/2019 12:44    Pending Labs Unresulted Labs (From admission, onward)    Start     Ordered   04/19/19 1213  Culture, blood (routine x 2)  BLOOD CULTURE X 2,   STAT    Question:  Patient immune status  Answer:  Immunocompromised   04/19/19 1214   04/19/19 1212  Urinalysis, Routine w reflex microscopic  ONCE - STAT,   STAT     04/19/19 1214   04/19/19 1212  Urine culture  ONCE - STAT,   STAT    Question:  Patient immune status  Answer:  Immunocompromised   04/19/19 1214   Signed and Held  ABO/Rh  Once,   R     Signed and Held   Signed and Held  Novel Coronavirus, NAA (hospital order; send-out to ref lab)  (Novel Coronavirus, NAA [facility] Order))  Once,   R    Question Answer Comment  Current symptoms Fever and Shortness of breath   Excluded other viral illnesses No (testing not indicated)      Signed and Held   Signed and Held  C-reactive  protein  Once,   R     Signed and Held   Signed and Held  Procalcitonin  Once,   R     Signed and Held   Signed and Held  Ferritin  Once,   R     Signed and Held   Signed and Held  D-dimer, quantitative (not at [facility])  Once,   R     Signed and Held   Signed and [employer]  Once,   R     Signed and Held   Signed and Held  CBC with Differential/Platelet  Daily,   R     Signed and Held   Visual merchandiser and Held  Comprehensive metabolic panel  Daily,   R     Signed and Held   Signed and Held  Basic metabolic panel   Daily,   R     Signed and Held   Signed and Held  Creatinine, serum  (enoxaparin (LOVENOX)    CrCl >/= 30 ml/min)  Weekly,   R    Comments:  while on enoxaparin therapy    Signed and Held          Vitals/Pain Today's Vitals   04/19/19 1412 04/19/19 1415 04/19/19 1430 04/19/19 1611  BP: (!) 142/64 (!) 141/76 (!) 150/71 139/67  Pulse: (!) 106 (!) 107 (!) 115 (!) 109  Resp: (!) 24 (!) 29 (!) 33 (!) 27  Temp:      TempSrc:      SpO2: 99% 100% 100% 99%  Weight:      Height:      PainSc:        Isolation Precautions Droplet and Contact precautions  Medications Medications  ondansetron (ZOFRAN) injection 4 mg (has no administration in time range)  furosemide (LASIX) injection 40 mg (40 mg Intravenous Given 04/19/19 1410)  albuterol (VENTOLIN HFA) 108 (90 Base) MCG/ACT inhaler 4 puff (4 puffs Inhalation Given 04/19/19 1410)    Mobility walks Low fall risk   Focused Assessments CHF   R Recommendations: See Admitting Provider Note  Report given to:   Additional Notes: .

## 2019-04-19 NOTE — ED Provider Notes (Signed)
[facility] DEPT Provider Note   CSN: [id number] Arrival date & time: 04/19/19  1056    History   Chief Complaint Chief Complaint  Patient presents with  . Headache  . Nausea    HPI April [name] is a 63 y.o. female.     HPI April [name] is a 63 y.o. female history of hyperlipidemia, reflux, depression, polycystic kidney disease status post renal transplant on tacrolimus, here presenting with nausea, dry heaves, headache, fever to 101 this morning, shortness of breath.  Patient identifies the symptoms as going on for about the past 6 to 7 days.  Patient was seen for similar symptoms about 3 weeks ago.  She tested negative for coronavirus at that time. Patient denies she is having abdominal pain.  She reports she just gets to feeling very nauseated and starts retching with dry heaves.  She has had a fairly persistent headache now.  Is generalized and aching in quality.  No sore throat difficulty swallowing.  He does feel like she has congestion and drainage in her sinuses.  She is also had cough and shortness of breath are made worse by lying flat.  Her chest is felt just slightly generally tight.  She reports that some of this feels like her asthma.  She reports she finds that she is fairly chronically short of breath due to asthma.  She does use inhalers and a nebulizer machine at home with some improvement.  Does not use home oxygen.  She called her primary care doctor today who advised her to come to the emergency department.  She reports she has not updated her transplant team on her recent illness or symptoms at this time. Past Medical History:  Diagnosis Date  . Arthritis   . Asthma   . Depression   . GERD (gastroesophageal reflux disease)   . Hyperlipidemia   . Hyperparathyroidism   . Polycystic kidney   . PONV (postoperative nausea and vomiting)     Patient Active Problem List   Diagnosis Date Noted  . Bilateral closed proximal  tibial fracture 12/21/2018  . Closed right ankle fracture 12/21/2018  . Fracture 12/21/2018  . Lower urinary tract infectious disease 10/03/2018  . Asthma 10/03/2018  . SIRS (systemic inflammatory response syndrome) ([place]) 10/03/2018  . Nausea vomiting and diarrhea 10/02/2018  . Chronic diastolic CHF (congestive heart failure) ([place]) 10/01/2017  . Influenza B 10/01/2017  . Persistent asthma with status asthmaticus   . Pneumonia 07/15/2016  . Asthma, mild intermittent 07/15/2016  . GERD (gastroesophageal reflux disease) 07/15/2016  . Renal transplant recipient 07/15/2016  . Hyperlipidemia 07/15/2016  . Depression 07/15/2016  . Bronchitis, mucopurulent recurrent ([place]) 08/08/2014  . Chronic cough 07/24/2014  . End stage renal disease ([place]) 03/23/2012  . 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 BILATERAL OOPHORECTOMY    . CARPAL TUNNEL RELEASE  2000  . CESAREAN SECTION    . INSERTION OF DIALYSIS CATHETER  03/31/2012   Procedure: INSERTION OF DIALYSIS CATHETER;  Surgeon: Angelia Mould, MD;  Location: [facility];  Service: Vascular;  Laterality: N/A;  insertion of dialysis catheter right internal jugular vein  . 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 ORIF TIBIA FRACTURE Left 12/23/2018   Procedure: OPEN REDUCTION INTERNAL FIXATION (ORIF) TIBIA FRACTURE;  Surgeon: Shona Needles, MD;  Location: [facility];  Service: Orthopedics;  Laterality: Left;  . ORIF TIBIA PLATEAU Right 12/23/2018   Procedure: OPEN REDUCTION INTERNAL FIXATION (ORIF) TIBIAL PLATEAU;  Surgeon: Shona Needles, MD;  Location: [facility];  Service: Orthopedics;  Laterality: Right;  . TONSILLECTOMY  1967  . TUBAL LIGATION  2010     OB History   No obstetric history on file.      Home Medications    Prior to Admission medications   Medication Sig Start Date End Date  Taking? Authorizing Provider  acetaminophen (TYLENOL) 500 MG tablet Take 1,000 mg by mouth every 6 (six) hours as needed for moderate pain.   Yes [provider]  acyclovir (ZOVIRAX) 400 MG tablet Take 400 mg by mouth 2 (two) times daily as needed (outbreaks).    Yes [provider]  albuterol (PROVENTIL HFA;VENTOLIN HFA) 108 (90 Base) MCG/ACT inhaler Inhale 2 puffs into the lungs every 4 (four) hours as needed for wheezing or shortness of breath. 07/16/16  Yes Ghimire, Henreitta Leber, MD  allopurinol (ZYLOPRIM) 300 MG tablet Take 300 mg by mouth daily.   Yes [provider]  aspirin EC 81 MG tablet Take 81 mg by mouth daily.   Yes [provider]  brimonidine (ALPHAGAN P) 0.1 % SOLN Place 1 drop 2 (two) times daily into the left eye.   Yes [provider]  cholecalciferol (VITAMIN D3) 25 MCG (1000 UT) tablet Take 1,000 Units by mouth every other day.    Yes [provider]  Colchicine 0.6 MG CAPS Take 0.6 mg by mouth daily as needed (gout).    Yes [provider]  cycloSPORINE (RESTASIS) 0.05 % ophthalmic emulsion Place 1 drop into both eyes 2 (two) times daily.   Yes [provider]  FLUoxetine (PROZAC) 40 MG capsule Take 40 mg by mouth daily.   Yes [provider]  furosemide (LASIX) 40 MG tablet Take 1 tablet by mouth daily.  04/20/14  Yes [provider]  HYDROcodone-acetaminophen (NORCO/VICODIN) 5-325 MG tablet Take 1-2 tablets by mouth every 4 (four) hours as needed for moderate pain. 12/29/18  Yes Spongberg, Audie Pinto, MD  ipratropium-albuterol (DUONEB) 0.5-2.5 (3) MG/3ML SOLN Take 3 mLs by nebulization every 6 (six) hours as needed (shortness of breath).  03/11/19  Yes [provider]  latanoprost (XALATAN) 0.005 % ophthalmic solution Place 1 drop into both eyes at bedtime.   Yes [provider]  magnesium oxide (MAG-OX) 400 MG tablet Take 400 mg by mouth 2 (two) times daily.   Yes [provider]  mycophenolate (MYFORTIC) 180 MG EC tablet Take 180 mg by mouth 2 (two) times daily.   Yes [provider]  ondansetron (ZOFRAN ODT) 4 MG disintegrating tablet 4mg  ODT q4 hours prn nausea/vomit Patient taking differently: Take 4 mg by mouth every 4 (four) hours as needed for nausea or vomiting.  03/31/19  Yes Drenda Freeze, MD  pantoprazole (PROTONIX) 40 MG tablet Take 40 mg by mouth daily.    Yes [provider]  prednisoLONE acetate (PRED FORTE) 1 % ophthalmic suspension Place 1 drop into both eyes See admin instructions. Instill 1 drop left eye four times daily and 1 drop right eye once daily.   Yes [provider]  predniSONE (DELTASONE) 5 MG tablet Take 7.5 mg by mouth daily.    Yes [provider]  PROGRAF 1 MG capsule Take 1 mg by mouth daily. 03/22/19  Yes [provider]  [facility]  80-4.5 MCG/ACT inhaler Inhale 2 puffs into the lungs 2 (two) times a day.  03/11/19  Yes [provider]  tacrolimus (PROGRAF) 0.5 MG capsule Take 0.5 mg by mouth at bedtime. Takes brand name Prograf   Yes [provider]  TRAVATAN Z 0.004 % SOLN ophthalmic solution Place 1 drop into both eyes at bedtime. 07/03/16  Yes [provider]  vitamin B-12 (CYANOCOBALAMIN) 100 MCG tablet Take 100 mcg by mouth daily.   Yes [provider]  enoxaparin (LOVENOX) 40 MG/0.4ML injection Inject 0.4 mLs (40 mg total) into the skin daily for 28 days. Patient not taking: Reported on 04/19/2019 12/30/18 01/27/19  Marcell Anger, MD    Family History Family History  Problem Relation Age of Onset  . Hypertension Mother   . Heart disease Father        CABG history  . Polycystic kidney disease Father   . Polycystic kidney disease Brother     Social History Social History   Tobacco Use  . Smoking status: Former Smoker    Packs/day: 0.25    Years: 15.00    Pack years: 3.75    Types: Cigarettes    Last attempt to quit:  03/22/1998    Years since quitting: 21.0  . Smokeless tobacco: Never Used  Substance Use Topics  . Alcohol use: No    Comment: social  . Drug use: No     Allergies   Infed [iron dextran]; Pentamidine; Erythromycin [erythromycin]; Oxycodone; Erythromycin; and Ultram [tramadol hcl]   Review of Systems Review of Systems 10 Systems reviewed and are negative for acute change except as noted in the HPI.  Physical Exam Updated Vital Signs BP (!) 142/64   Pulse (!) 106   Temp 99.5 F (37.5 C) (Oral)   Resp (!) 24   Ht 5\' 2"  (1.575 m)   Wt 82.5 kg   SpO2 99%   BMI 33.27 kg/m   Physical Exam Constitutional:      Comments: Patient is alert and appropriate.  She does have clear mental status.  She appears mildly uncomfortable and slightly ill.  No respiratory distress at rest.  HENT:     Head: Normocephalic and atraumatic.     Nose: Nose normal.     Mouth/Throat:     Mouth: Mucous membranes are moist.     Pharynx: Oropharynx is clear.  Eyes:     Extraocular Movements: Extraocular movements intact.     Pupils: Pupils are equal, round, and reactive to light.  Neck:     Musculoskeletal: Neck supple.  Cardiovascular:     Comments: Tachycardia.  No gross rub murmur gallop. Pulmonary:     Comments: No respiratory distress at this time.  Lungs are grossly clear.  Slightly diminished breath sounds at the bases. Abdominal:     General: There is no distension.     Palpations: Abdomen is soft.     Tenderness: There is no abdominal tenderness. There is no guarding.  Musculoskeletal:     Comments: Patient has 1+ pitting edema bilateral lower legs ankles and feet.  Calves are soft and nontender.  Feet are in good condition.  There is no signs of cellulitis or wounds to the lower legs.  Skin:    General: Skin is warm and dry.  Neurological:     General: No focal deficit present.     Mental Status: She is oriented to person, place, and time.     Coordination: Coordination normal.  Psychiatric:        Mood and Affect: Mood normal.      ED Treatments / Results  Labs (all labs ordered are listed, but only abnormal results are displayed) Labs Reviewed  COMPREHENSIVE METABOLIC PANEL - Abnormal; Notable for the following components:      Result Value   Sodium 131 (*)    Chloride 97 (*)    Glucose, Bld 111 (*)    BUN 25 (*)    Creatinine, Ser 1.05 (*)    Albumin 3.2 (*)    Total Bilirubin 3.9 (*)    GFR calc non Af Amer 57 (*)    All other components within normal limits  BRAIN NATRIURETIC PEPTIDE - Abnormal; Notable for the following components:   B Natriuretic Peptide 1,089.2 (*)    All other components within normal limits  TROPONIN I - Abnormal; Notable for the following components:   Troponin I 0.03 (*)    All other components within normal limits  CBC WITH DIFFERENTIAL/PLATELET - Abnormal; Notable for the following components:   WBC 18.1 (*)    RBC 3.81 (*)    Hemoglobin 10.8 (*)    RDW 19.9 (*)    Neutro Abs 15.6 (*)    Abs Immature Granulocytes 0.13 (*)    All other components within normal limits  PROTIME-INR - Abnormal; Notable for the following components:   Prothrombin Time 15.4 (*)    All other components within normal limits  BLOOD GAS, ARTERIAL - Abnormal; Notable for the following components:   pO2, Arterial 153 (*)    All other components within normal limits  SARS CORONAVIRUS 2 (HOSPITAL ORDER, [facility])  URINE CULTURE  CULTURE, BLOOD (ROUTINE X 2)  CULTURE, BLOOD (ROUTINE X 2)  LIPASE, BLOOD  LACTIC ACID, PLASMA  URINALYSIS, ROUTINE W REFLEX MICROSCOPIC    EKG EKG Interpretation  Date/Time:  Wednesday Apr 19 2019 11:25:48 EDT Ventricular Rate:  112 PR Interval:    QRS Duration: 84 QT Interval:  323 QTC Calculation: 441 R Axis:   78 Text Interpretation:  Sinus tachycardia Borderline low voltage, extremity leads no sig change from previous Confirmed by Charlesetta Shanks [id number]) on 04/19/2019  2:01:08 PM   Radiology Dg Abd Acute W/chest  Result Date: 04/19/2019 CLINICAL DATA:  Cough, fever, shortness of breath, and vomiting. EXAM: DG ABDOMEN ACUTE W/ 1V CHEST COMPARISON:  CT abdomen pelvis dated Mar 31, 2019. Chest x-ray dated Mar 31, 2019. FINDINGS: The cardiomediastinal silhouette is normal in size. New mild pulmonary vascular congestion. No focal consolidation, pleural effusion, or pneumothorax. Unchanged linear bibasilar atelectasis/scarring. No acute osseous abnormality. There is no evidence of dilated bowel loops or free intraperitoneal air. No radiopaque calculi or other significant radiographic abnormality is seen. Unchanged splenomegaly. IMPRESSION: 1. No acute abnormality in the abdomen. 2. Unchanged splenomegaly. 3. New mild pulmonary vascular congestion. Electronically Signed   By: Titus Dubin M.D.   On: 04/19/2019 12:44    Procedures Procedures (including critical care time) CRITICAL CARE Performed by: Charlesetta Shanks   Total critical care time: 30 minutes  Critical care time was exclusive of separately billable procedures and treating other patients.  Critical care was necessary to treat or prevent imminent or life-threatening deterioration.  Critical care was time spent personally by me on the following activities: development of treatment plan with patient and/or surrogate as well as nursing, discussions with consultants, evaluation of patient's response to treatment, examination of patient, obtaining history from patient or  surrogate, ordering and performing treatments and interventions, ordering and review of laboratory studies, ordering and review of radiographic studies, pulse oximetry and re-evaluation of patient's condition. Medications Ordered in ED Medications  ondansetron (ZOFRAN) injection 4 mg (has no administration in time range)  furosemide (LASIX) injection 40 mg (40 mg Intravenous Given 04/19/19 1410)  albuterol (VENTOLIN HFA) 108 (90 Base) MCG/ACT  inhaler 4 puff (4 puffs Inhalation Given 04/19/19 1410)     Initial Impression / Assessment and Plan / ED Course  I have reviewed the triage vital signs and the nursing notes.  Pertinent labs & imaging results that were available during my care of the patient were reviewed by me and considered in my medical decision making (see chart for details).       Patient has had fairly severe nausea with dry heaves and inability to take her medications.  She does feel short of breath which is worsened by lying flat.  She also has peripheral edema of the lower extremities.  BNP is elevated and patient does have vascular congestion on chest x-ray.  At this time, I suspect CHF.  Patient does not apparently have known history of CHF however diagnostic studies and clinical appearance right now are very suggestive.  Patient has been given a dose of Lasix.  He continues to complain of significant nausea but denies abdominal pain.  She has complex comorbid condition of history of renal transplant on tacrolimus.  At this time, kidney function is stable.  Plan for admission for further management.  Final Clinical Impressions(s) / ED Diagnoses   Final diagnoses:  Acute congestive heart failure, unspecified heart failure type [facility])  H/O kidney transplant  Nausea    ED Discharge Orders    None       Charlesetta Shanks, MD 04/19/19 1506

## 2019-04-19 NOTE — H&P (Signed)
History and Physical    April [name] HQP:[id number] DOB: December 09, 1955 DOA: 04/19/2019  PCP: April Caraway, MD Patient coming from: Home  Chief Complaint: Dyspnea, vomiting  HPI: April [name] is a 63 y.o. female with medical history significant of asthma, GERD, ESRD status post renal transplant, chronic diastolic heart failure. Shortness of breath and vomiting started three days ago and had been persistent but has since improved. She also reports a occipital/sinus headache for the past 6 days that has progressed. Oxygen has helped her dyspnea. She reports taking not taking anything at home to help with symptoms secondary to vomiting, although she tried to take some Protonix. No sick contacts. No known exposure to COVID-19.  Patient reports a T-max of 101.7 F this morning.  ED Course: Vitals: T-max of 99.5 F, pulse in the 100s to low 1 teens, respirations in the mid 20s, mildly elevated blood pressure in the 599J to 570V systolic over 77L diastolic, SPO2 of high 39Q to 100% on 2 L via nasal cannula Labs: BNP of 1000, troponin of 0.03, WBC of 18.1k, hemoglobin of 10.8 Imaging: CT head significant for no acute abnormalities and acute chest/abdominal x-ray significant for pulmonary vascular congestion Medications/Course: Lasix IV  Review of Systems: Review of Systems  Constitutional: Positive for fever.  Respiratory: Positive for shortness of breath.   Cardiovascular: Negative for chest pain and leg swelling.  Gastrointestinal: Positive for nausea and vomiting. Negative for abdominal pain, constipation and diarrhea.  Neurological: Positive for headaches.  All other systems reviewed and are negative.   Past Medical History:  Diagnosis Date  . Arthritis   . Asthma   . Depression   . GERD (gastroesophageal reflux disease)   . Hyperlipidemia   . Hyperparathyroidism   . Polycystic kidney   . PONV (postoperative nausea and vomiting)     Past Surgical History:  Procedure  Laterality Date  . 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 BILATERAL OOPHORECTOMY    . CARPAL TUNNEL RELEASE  2000  . CESAREAN SECTION    . INSERTION OF DIALYSIS CATHETER  03/31/2012   Procedure: INSERTION OF DIALYSIS CATHETER;  Surgeon: Angelia Mould, MD;  Location: [facility];  Service: Vascular;  Laterality: N/A;  insertion of dialysis catheter right internal jugular vein  . 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 ORIF TIBIA FRACTURE Left 12/23/2018   Procedure: OPEN REDUCTION INTERNAL FIXATION (ORIF) TIBIA FRACTURE;  Surgeon: Shona Needles, MD;  Location: [facility];  Service: Orthopedics;  Laterality: Left;  . ORIF TIBIA PLATEAU Right 12/23/2018   Procedure: OPEN REDUCTION INTERNAL FIXATION (ORIF) TIBIAL PLATEAU;  Surgeon: Shona Needles, MD;  Location: [place];  Service: Orthopedics;  Laterality: Right;  . TONSILLECTOMY  1967  . TUBAL LIGATION  2010     reports that she quit smoking about 21 years ago. Her smoking use included cigarettes. She has a 3.75 pack-year smoking history. She has never used smokeless tobacco. She reports that she does not drink alcohol or use drugs.  Allergies  Allergen Reactions  . Infed [Iron Dextran] Other (See Comments)    Chest tightness  . Pentamidine Itching, Shortness Of Breath and Swelling  . Erythromycin [Erythromycin] Other (See Comments)    Mouth Ulcers  . Oxycodone Nausea Only and Nausea And Vomiting  . Erythromycin Rash    Causes breakout in mouth  . Ultram [Tramadol Hcl] Anxiety    Family History  Problem Relation Age of Onset  .  Hypertension Mother   . Heart disease Father        CABG history  . Polycystic kidney disease Father   . Polycystic kidney disease Brother    Prior to Admission medications   Medication Sig Start Date End Date Taking? Authorizing Provider  acetaminophen (TYLENOL) 500 MG tablet Take 1,000 mg by mouth every 6 (six) hours as needed for moderate pain.   Yes [provider]  acyclovir  (ZOVIRAX) 400 MG tablet Take 400 mg by mouth 2 (two) times daily as needed (outbreaks).    Yes [provider]  albuterol (PROVENTIL HFA;VENTOLIN HFA) 108 (90 Base) MCG/ACT inhaler Inhale 2 puffs into the lungs every 4 (four) hours as needed for wheezing or shortness of breath. 07/16/16  Yes Ghimire, Henreitta Leber, MD  allopurinol (ZYLOPRIM) 300 MG tablet Take 300 mg by mouth daily.   Yes [provider]  aspirin EC 81 MG tablet Take 81 mg by mouth daily.   Yes [provider]  brimonidine (ALPHAGAN P) 0.1 % SOLN Place 1 drop 2 (two) times daily into the left eye.   Yes [provider]  cholecalciferol (VITAMIN D3) 25 MCG (1000 UT) tablet Take 1,000 Units by mouth every other day.    Yes [provider]  Colchicine 0.6 MG CAPS Take 0.6 mg by mouth daily as needed (gout).    Yes [provider]  cycloSPORINE (RESTASIS) 0.05 % ophthalmic emulsion Place 1 drop into both eyes 2 (two) times daily.   Yes [provider]  FLUoxetine (PROZAC) 40 MG capsule Take 40 mg by mouth daily.   Yes [provider]  furosemide (LASIX) 40 MG tablet Take 1 tablet by mouth daily.  04/20/14  Yes [provider]  HYDROcodone-acetaminophen (NORCO/VICODIN) 5-325 MG tablet Take 1-2 tablets by mouth every 4 (four) hours as needed for moderate pain. 12/29/18  Yes Spongberg, Audie Pinto, MD  ipratropium-albuterol (DUONEB) 0.5-2.5 (3) MG/3ML SOLN Take 3 mLs by nebulization every 6 (six) hours as needed (shortness of breath).  03/11/19  Yes [provider]  latanoprost (XALATAN) 0.005 % ophthalmic solution Place 1 drop into both eyes at bedtime.   Yes [provider]  magnesium oxide (MAG-OX) 400 MG tablet Take 400 mg by mouth 2 (two) times daily.   Yes [provider]  mycophenolate (MYFORTIC) 180 MG EC tablet Take 180 mg by mouth 2 (two) times daily.   Yes [provider]  ondansetron (ZOFRAN ODT) 4 MG disintegrating  tablet 4mg  ODT q4 hours prn nausea/vomit Patient taking differently: Take 4 mg by mouth every 4 (four) hours as needed for nausea or vomiting.  03/31/19  Yes Drenda Freeze, MD  pantoprazole (PROTONIX) 40 MG tablet Take 40 mg by mouth daily.    Yes [provider]  prednisoLONE acetate (PRED FORTE) 1 % ophthalmic suspension Place 1 drop into both eyes See admin instructions. Instill 1 drop left eye four times daily and 1 drop right eye once daily.   Yes [provider]  predniSONE (DELTASONE) 5 MG tablet Take 7.5 mg by mouth daily.    Yes [provider]  PROGRAF 1 MG capsule Take 1 mg by mouth daily. 03/22/19  Yes [provider]  SYMBICORT 80-4.5 MCG/ACT inhaler Inhale 2 puffs into the lungs 2 (two) times a day.  03/11/19  Yes [provider]  tacrolimus (PROGRAF) 0.5 MG capsule Take 0.5 mg by mouth at bedtime. Takes brand name Prograf   Yes [provider]  TRAVATAN Z 0.004 % SOLN ophthalmic solution Place 1 drop into both eyes at bedtime. 07/03/16  Yes [provider]  vitamin B-12 (CYANOCOBALAMIN) 100 MCG tablet Take 100 mcg by mouth daily.   Yes [provider]  enoxaparin (LOVENOX) 40 MG/0.4ML injection Inject 0.4 mLs (40 mg total) into the skin daily for 28 days. Patient not taking: Reported on 04/19/2019 12/30/18 01/27/19  Marcell Anger, MD    Physical Exam:  Physical Exam Constitutional:      General: She is not in acute distress. HENT:     Mouth/Throat:     Mouth: Mucous membranes are moist.     Pharynx: Oropharynx is clear.  Neck:     Musculoskeletal: Normal range of motion.  Cardiovascular:     Rate and Rhythm: Regular rhythm. Tachycardia present.     Heart sounds: Murmur present. Systolic (harsh) murmur present with a grade of 3/6.  Pulmonary:     Effort: Tachypnea present.     Breath sounds: Decreased breath sounds present. No wheezing.  Abdominal:     General: There is no distension.      Palpations: Abdomen is soft.     Tenderness: There is no abdominal tenderness.  Musculoskeletal: Normal range of motion.  Lymphadenopathy:     Cervical: No cervical adenopathy.  Skin:    General: Skin is warm and dry.  Neurological:     Mental Status: She is alert and oriented to person, place, and time.  Psychiatric:        Mood and Affect: Mood normal.     Labs on Admission: I have personally reviewed following labs and imaging studies  CBC: Recent Labs  Lab 04/19/19 1244  WBC 18.1*  NEUTROABS 15.6*  HGB 10.8*  HCT 36.0  MCV 94.5  PLT 696    Basic Metabolic Panel: Recent Labs  Lab 04/19/19 1243  NA 131*  K 4.2  CL 97*  CO2 24  GLUCOSE 111*  BUN 25*  CREATININE 1.05*  CALCIUM 9.5    GFR: Estimated Creatinine Clearance: 56 mL/min (A) (by C-G formula based on SCr of 1.05 mg/dL (H)).  Liver Function Tests: Recent Labs  Lab 04/19/19 1243  AST 22  ALT 21  ALKPHOS 114  BILITOT 3.9*  PROT 7.6  ALBUMIN 3.2*   Recent Labs  Lab 04/19/19 1243  LIPASE 22   No results for input(s): AMMONIA in the last 168 hours.  Coagulation Profile: Recent Labs  Lab 04/19/19 1244  INR 1.2    Cardiac Enzymes: Recent Labs  Lab 04/19/19 1244  TROPONINI 0.03*    BNP (last 3 results) No results for input(s): PROBNP in the last 8760 hours.  HbA1C: No results for input(s): HGBA1C in the last 72 hours.  CBG: No results for input(s): GLUCAP in the last 168 hours.  Lipid Profile: No results for input(s): CHOL, HDL, LDLCALC, TRIG, CHOLHDL, LDLDIRECT in the last 72 hours.  Thyroid Function Tests: No results for input(s): TSH, T4TOTAL, FREET4, T3FREE, THYROIDAB in the last 72 hours.  Anemia Panel: No results for input(s): VITAMINB12, FOLATE, FERRITIN, TIBC, IRON, RETICCTPCT in the last 72 hours.  Urine analysis:    Component Value Date/Time   COLORURINE ORANGE (A) 03/31/2019 2035   APPEARANCEUR CLEAR 03/31/2019 2035   LABSPEC <1.005 (L) 03/31/2019 2035    PHURINE 6.0 03/31/2019 2035   GLUCOSEU NEGATIVE 03/31/2019 2035   HGBUR MODERATE (A) 03/31/2019 2035   BILIRUBINUR SMALL (A) 03/31/2019 2035   KETONESUR NEGATIVE 03/31/2019  2035   PROTEINUR 30 (A) 03/31/2019 2035   UROBILINOGEN 0.2 05/15/2013 0733   NITRITE NEGATIVE 03/31/2019 2035   LEUKOCYTESUR NEGATIVE 03/31/2019 2035     Radiological Exams on Admission: Ct Head Wo Contrast  Result Date: 04/19/2019 CLINICAL DATA:  Headache EXAM: CT HEAD WITHOUT CONTRAST TECHNIQUE: Contiguous axial images were obtained from the base of the skull through the vertex without intravenous contrast. COMPARISON:  CT head dated 12/27/2018 FINDINGS: Brain: No evidence of acute infarction, hemorrhage, hydrocephalus, extra-axial collection or mass lesion/mass effect. Vascular: No hyperdense vessel or unexpected calcification. Skull: Normal. Negative for fracture or focal lesion. Sinuses/Orbits: No acute finding. The patient is status post bilateral cataract surgery. Other: None. IMPRESSION: No acute intracranial abnormality detected. Electronically Signed   By: Constance Holster M.D.   On: 04/19/2019 15:17   Dg Abd Acute W/chest  Result Date: 04/19/2019 CLINICAL DATA:  Cough, fever, shortness of breath, and vomiting. EXAM: DG ABDOMEN ACUTE W/ 1V CHEST COMPARISON:  CT abdomen pelvis dated Mar 31, 2019. Chest x-ray dated Mar 31, 2019. FINDINGS: The cardiomediastinal silhouette is normal in size. New mild pulmonary vascular congestion. No focal consolidation, pleural effusion, or pneumothorax. Unchanged linear bibasilar atelectasis/scarring. No acute osseous abnormality. There is no evidence of dilated bowel loops or free intraperitoneal air. No radiopaque calculi or other significant radiographic abnormality is seen. Unchanged splenomegaly. IMPRESSION: 1. No acute abnormality in the abdomen. 2. Unchanged splenomegaly. 3. New mild pulmonary vascular congestion. Electronically Signed   By: Titus Dubin M.D.   On: 04/19/2019  12:44    EKG: Independently reviewed. Sinus tachycardia  Assessment/Plan Active Problems:   Asthma, mild intermittent   GERD (gastroesophageal reflux disease)   Renal transplant recipient   Acute respiratory failure with hypoxia (HCC)   Acute on chronic diastolic CHF (congestive heart failure) (HCC)   Acute respiratory failure with hypoxia Acute on chronic diastolic heart failure Patient with a history of grade to diastolic heart dysfunction from 2011 Transthoracic Echocardiogram. BNP elevated. Associated tachycardia. No chest pain. No history of DVT. Associated fever and significantly elevated WBC. Normal leukocytes. No known COVID-19 exposure. Chest x-ray unremarkable for definite infiltrate. -Continue O2 supplementation -Transthoracic Echocardiogram ordered and pending -Lasix 40 mg IV -Strict in and out with daily weights -Daily metabolic panel  SIRS No source. No obvious pneumonia. Other than dyspnea and hypoxia, no symptoms consistent with pneumonia. Initial COVID-19 testing was negative in the ED. Blood cultures obtained in the ED -Will start empiric Zosyn in setting of immunosuppression -Will not start Cefepime secondary to history of renal transplant and last hospitalization about 4 months ago  History of ESRD s/p kidney transplant Follows with [facility] as an outpatient. Creatinine stable. -Continue home prednisone, Prograf -Continuing Myfortic for now, but if concern for serious infection, would discuss with nephrology about discontinuing acutely -AM BMP  Headache Occipital. Mild. CT scan negative. -Analgesics prn -Will add Flexeril prn  Gout -Continue Allopurinol  Depression -Continue Prozac   DVT prophylaxis: Lovenox Code Status: Full code Family Communication: None Disposition Plan: Stepdown unit, discharge home pending improvement of oxygen requirement and improvement of heart failure Consults called: None Admission status: Inpatient   Cordelia Poche,  MD [facility] 04/19/2019, 3:41 PM  If 7PM-7AM, please contact night-coverage [email or web address] Password [email or web address]

## 2019-04-19 NOTE — Progress Notes (Signed)
Pharmacy Antibiotic Note  April [name] is a 63 y.o. female with hx renal transplant, presented to t he ED on 04/19/2019 with c/o headache and emesis.  To start cefepime for sepsis.  - scr 1.05 (crcl~56)  Plan: - cefepime 2gm IV q12h  __________________________________  Height: 5\' 2"  (157.5 cm) Weight: 181 lb 14.1 oz (82.5 kg) IBW/kg (Calculated) : 50.1  Temp (24hrs), Avg:99.5 F (37.5 C), Min:99.5 F (37.5 C), Max:99.5 F (37.5 C)  Recent Labs  Lab 04/19/19 1243 04/19/19 1244  WBC  --  18.1*  CREATININE 1.05*  --   LATICACIDVEN  --  1.0    Estimated Creatinine Clearance: 56 mL/min (A) (by C-G formula based on SCr of 1.05 mg/dL (H)).    Allergies  Allergen Reactions  . Infed [Iron Dextran] Other (See Comments)    Chest tightness  . Pentamidine Itching, Shortness Of Breath and Swelling  . Erythromycin [Erythromycin] Other (See Comments)    Mouth Ulcers  . Oxycodone Nausea Only and Nausea And Vomiting  . Erythromycin Rash    Causes breakout in mouth  . Ultram [Tramadol Hcl] Anxiety      Thank you for allowing pharmacy to be a part of this patient's care.  Lynelle Doctor 04/19/2019 6:31 PM

## 2019-04-20 ENCOUNTER — Other Ambulatory Visit (HOSPITAL_COMMUNITY): Payer: Medicare Other

## 2019-04-20 ENCOUNTER — Encounter (HOSPITAL_COMMUNITY): Payer: Self-pay | Admitting: Nephrology

## 2019-04-20 DIAGNOSIS — J4521 Mild intermittent asthma with (acute) exacerbation: Secondary | ICD-10-CM

## 2019-04-20 LAB — COMPREHENSIVE METABOLIC PANEL
ALT: 18 U/L (ref 0–44)
AST: 19 U/L (ref 15–41)
Albumin: 2.8 g/dL — ABNORMAL LOW (ref 3.5–5.0)
Alkaline Phosphatase: 102 U/L (ref 38–126)
Anion gap: 12 (ref 5–15)
BUN: 28 mg/dL — ABNORMAL HIGH (ref 8–23)
CO2: 21 mmol/L — ABNORMAL LOW (ref 22–32)
Calcium: 9.2 mg/dL (ref 8.9–10.3)
Chloride: 97 mmol/L — ABNORMAL LOW (ref 98–111)
Creatinine, Ser: 1.22 mg/dL — ABNORMAL HIGH (ref 0.44–1.00)
GFR calc Af Amer: 55 mL/min — ABNORMAL LOW (ref >60)
GFR calc non Af Amer: 48 mL/min — ABNORMAL LOW (ref >60)
Glucose, Bld: 98 mg/dL (ref 70–99)
Potassium: 4 mmol/L (ref 3.5–5.1)
Sodium: 130 mmol/L — ABNORMAL LOW (ref 135–145)
Total Bilirubin: 3.6 mg/dL — ABNORMAL HIGH (ref 0.3–1.2)
Total Protein: 7 g/dL (ref 6.5–8.1)

## 2019-04-20 LAB — CBC WITH DIFFERENTIAL/PLATELET
Abs Immature Granulocytes: 0.09 10*3/uL — ABNORMAL HIGH (ref 0.00–0.07)
Basophils Absolute: 0 10*3/uL (ref 0.0–0.1)
Basophils Relative: 0 %
Eosinophils Absolute: 0.1 10*3/uL (ref 0.0–0.5)
Eosinophils Relative: 1 %
HCT: 35.8 % — ABNORMAL LOW (ref 36.0–46.0)
Hemoglobin: 10.5 g/dL — ABNORMAL LOW (ref 12.0–15.0)
Immature Granulocytes: 1 %
Lymphocytes Relative: 11 %
Lymphs Abs: 1.9 10*3/uL (ref 0.7–4.0)
MCH: 27.1 pg (ref 26.0–34.0)
MCHC: 29.3 g/dL — ABNORMAL LOW (ref 30.0–36.0)
MCV: 92.5 fL (ref 80.0–100.0)
Monocytes Absolute: 0.5 10*3/uL (ref 0.1–1.0)
Monocytes Relative: 3 %
Neutro Abs: 14.1 10*3/uL — ABNORMAL HIGH (ref 1.7–7.7)
Neutrophils Relative %: 84 %
Platelets: 174 10*3/uL (ref 150–400)
RBC: 3.87 MIL/uL (ref 3.87–5.11)
RDW: 19.8 % — ABNORMAL HIGH (ref 11.5–15.5)
WBC: 16.8 10*3/uL — ABNORMAL HIGH (ref 4.0–10.5)
nRBC: 0 % (ref 0.0–0.2)

## 2019-04-20 LAB — URINE CULTURE: Culture: <10000 — AB

## 2019-04-20 MED ORDER — ORAL CARE MOUTH RINSE
15.0000 mL | Freq: Two times a day (BID) | OROMUCOSAL | Status: DC
  Administered 2019-04-20 – 2019-04-22 (×4): 15 mL via OROMUCOSAL

## 2019-04-20 MED ORDER — CHLORHEXIDINE GLUCONATE 0.12 % MT SOLN
15.0000 mL | Freq: Two times a day (BID) | OROMUCOSAL | Status: DC
  Administered 2019-04-20 – 2019-04-23 (×7): 15 mL via OROMUCOSAL
  Filled 2019-04-20 (×7): qty 15

## 2019-04-20 MED ORDER — SODIUM CHLORIDE 0.45 % IV SOLN
INTRAVENOUS | Status: DC
  Administered 2019-04-20 – 2019-04-21 (×3): via INTRAVENOUS

## 2019-04-20 MED ORDER — CHLORHEXIDINE GLUCONATE CLOTH 2 % EX PADS
6.0000 | MEDICATED_PAD | Freq: Every day | CUTANEOUS | Status: DC
  Administered 2019-04-20: 23:00:00 6 via TOPICAL

## 2019-04-20 NOTE — TOC Initial Note (Signed)
Transition of Care [facility]) - Initial/Assessment Note    Patient Details  Name: April [name] MRN: [id number] Date of Birth: [birth year]  Transition of Care [facility] CM/SW Contact:    Joaquin Courts, RN Phone Number: 04/20/2019, 10:27 AM  Clinical Narrative:    Pt from Home                Expected Discharge Plan: Home/Self Care Barriers to Discharge: Continued Medical Work up   Patient Goals and CMS Choice        Expected Discharge Plan and Services Expected Discharge Plan: Home/Self Care         Expected Discharge Date: (unknown)                                    Prior Living Arrangements/Services     Patient language and need for interpreter reviewed:: Yes Do you feel safe going back to the place where you live?: Yes      Need for Family Participation in Patient Care: Yes (Comment) Care giver support system in place?: Yes (comment)   Criminal Activity/Legal Involvement Pertinent to Current Situation/Hospitalization: No - Comment as needed  Activities of Daily Living Home Assistive Devices/Equipment: Eyeglasses, Environmental consultant (specify type), Other (Comment)(front wheeled walker, ramp entrance) ADL Screening (condition at time of admission) Patient's cognitive ability adequate to safely complete daily activities?: Yes Is the patient deaf or have difficulty hearing?: No Does the patient have difficulty seeing, even when wearing glasses/contacts?: No Does the patient have difficulty concentrating, remembering, or making decisions?: No Patient able to express need for assistance with ADLs?: Yes Does the patient have difficulty dressing or bathing?: No Independently performs ADLs?: Yes (appropriate for developmental age) Does the patient have difficulty walking or climbing stairs?: Yes Weakness of Legs: Both Weakness of Arms/Hands: None  Permission Sought/Granted                  Emotional Assessment Appearance:: Appears stated age      Orientation: : Oriented to Self, Oriented to Place, Oriented to  Time, Oriented to Situation Alcohol / Substance Use: Tobacco Use(quit) Psych Involvement: No (comment)  Admission diagnosis:  Nausea [R11.0] H/O kidney transplant [Z94.0] Acute congestive heart failure, unspecified heart failure type ([place]) [I50.9] Patient Active Problem List   Diagnosis Date Noted  . Acute respiratory failure with hypoxia ([place]) 04/19/2019  . Acute on chronic diastolic CHF (congestive heart failure) ([place]) 04/19/2019  . Bilateral closed proximal tibial fracture 12/21/2018  . Closed right ankle fracture 12/21/2018  . Fracture 12/21/2018  . Lower urinary tract infectious disease 10/03/2018  . Asthma 10/03/2018  . SIRS (systemic inflammatory response syndrome) ([place]) 10/03/2018  . Nausea vomiting and diarrhea 10/02/2018  . Chronic diastolic CHF (congestive heart failure) ([place]) 10/01/2017  . Influenza B 10/01/2017  . Persistent asthma with status asthmaticus   . Pneumonia 07/15/2016  . Asthma, mild intermittent 07/15/2016  . GERD (gastroesophageal reflux disease) 07/15/2016  . Renal transplant recipient 07/15/2016  . Hyperlipidemia 07/15/2016  . Depression 07/15/2016  . Bronchitis, mucopurulent recurrent ([place]) 08/08/2014  . Chronic cough 07/24/2014  . End stage renal disease ([place]) 03/23/2012  . 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, implant, and graft 03/23/2012   PCP:  [name], MD Pharmacy:   [place], [place] [address] Phone: [phone] Fax: [phone]  Social Determinants of Health (SDOH) Interventions    Readmission Risk Interventions Readmission Risk Prevention Plan 04/20/2019  Transportation Screening Complete  PCP or Specialist Appt within 3-5 Days Not Complete  Not Complete comments not yet ready for d/c  HRI or [place] Not Complete  HRI or Home Care Consult  comments no needs identified  Social Work Consult for [place] Planning/Counseling Not Complete  SW consult not completed comments no needs identified  Palliative Care Screening Not Applicable  Medication Review Press photographer) Complete  Some recent data might be hidden

## 2019-04-20 NOTE — Progress Notes (Signed)
PROGRESS NOTE    April [name]  YQI:[id number] DOB: 01-04-1956 DOA: 04/19/2019 PCP: [name], MD    Brief Narrative:  63 year old female with history of asthma, GERD, ESRD status post renal transplant and chronic diastolic heart failure who was admitted to the hospital with about 3 days of shortness of breath and nausea and vomiting.  Patient reported temperature 101.7 at home. In the emergency room, patient had low-grade temperature 99.5.  She was tachycardic and tachypneic.  BNP 1000.  WBC 18,000.  CT head was normal.  Chest x-ray showed pulmonary vascular congestion.  COVID-19 was negative.  She was given IV Lasix and admitted to the hospital.  Assessment & Plan:   Active Problems:   Asthma, mild intermittent   GERD (gastroesophageal reflux disease)   Renal transplant recipient   Acute respiratory failure with hypoxia (HCC)   Acute on chronic diastolic CHF (congestive heart failure) (HCC)  Acute respiratory failure with hypoxia: Suspect acute on chronic diastolic heart failure. Had some response with IV Lasix.  Will continue.  Intake and output monitoring.  Her creatinine slightly elevated today with diuresis.  Echocardiogram is done, results are pending. Continue Lasix IV. Keep on oxygen to keep saturations more than 90%.  Sirs: With no obvious source of infection.  Patient does have pulmonary symptoms including cough and wheezing.  She does have history of asthma.  Currently remains on cefepime.  Cultures are pending.  Blood pressures are fairly stable.  Continue cefepime until clinical improvement or final cultures.  Repeat COVID-19 test pending. Patient start aggressive bronchodilator therapy, cough and deep breathing exercises and incentive spirometry. Since patient has pulmonary symptoms, will continue COVID-19 precautions until repeat test resulted.  History of kidney transplant for ESRD: Renal functions more or less is stable.  Patient is on prednisone and Prograf.   Called and discussed case with nephrology and they will see patient in consultation.  Depression: On Prozac.  DVT prophylaxis: Lovenox subcu Code Status: Full code Family Communication: None Disposition Plan: Inpatient   Consultants:   Nephrology  Procedures:   None  Antimicrobials:   Cefepime, 04/19/2019----   Subjective: Patient was seen and examined.  Complained of some cough and wheezing.  T-max 99.6.  No sputum production.  Cultures negative so far.  Denies any nausea vomiting.  She was able to eat regular diet.  Objective: Vitals:   04/20/19 0500 04/20/19 0700 04/20/19 0800 04/20/19 0900  BP: (!) 123/47 (!) 116/46 (!) 114/50 (!) 121/44  Pulse: (!) 106 (!) 102 100 99  Resp: (!) 31 (!) 24 (!) 21 (!) 27  Temp:   97.9 F (36.6 C)   TempSrc:  Oral    SpO2: 99% 99% 99% 100%  Weight: 80.4 kg     Height:        Intake/Output Summary (Last 24 hours) at 04/20/2019 1251 Last data filed at 04/20/2019 0618 Gross per 24 hour  Intake 100 ml  Output 200 ml  Net -100 ml   Filed Weights   04/19/19 1112 04/20/19 0500  Weight: 82.5 kg 80.4 kg    Examination:  General exam: Appears calm and comfortable, chronically sick looking.  Slightly anxious. Respiratory system: Bilateral expiratory crackles and end expiratory wheezes. Cardiovascular system: S1 & S2 heard, RRR. No JVD, systolic murmurs, rubs, gallops or clicks.  1+ bilateral pedal edema. Gastrointestinal system: Abdomen is nondistended, soft and nontender. No organomegaly or masses felt. Normal bowel sounds heard. Central nervous system: Alert and oriented. No focal neurological deficits.  Extremities: Symmetric 5 x 5 power. Skin: No rashes, lesions or ulcers Psychiatry: Judgement and insight appear normal. Mood & affect flat and anxious.    Data Reviewed: I have personally reviewed following labs and imaging studies  CBC: Recent Labs  Lab 04/19/19 1244 04/20/19 0250  WBC 18.1* 16.8*  NEUTROABS 15.6* 14.1*   HGB 10.8* 10.5*  HCT 36.0 35.8*  MCV 94.5 92.5  PLT 178 299   Basic Metabolic Panel: Recent Labs  Lab 04/19/19 1243 04/20/19 0250  NA 131* 130*  K 4.2 4.0  CL 97* 97*  CO2 24 21*  GLUCOSE 111* 98  BUN 25* 28*  CREATININE 1.05* 1.22*  CALCIUM 9.5 9.2   GFR: Estimated Creatinine Clearance: 47.5 mL/min (A) (by C-G formula based on SCr of 1.22 mg/dL (H)). Liver Function Tests: Recent Labs  Lab 04/19/19 1243 04/20/19 0250  AST 22 19  ALT 21 18  ALKPHOS 114 102  BILITOT 3.9* 3.6*  PROT 7.6 7.0  ALBUMIN 3.2* 2.8*   Recent Labs  Lab 04/19/19 1243  LIPASE 22   No results for input(s): AMMONIA in the last 168 hours. Coagulation Profile: Recent Labs  Lab 04/19/19 1244  INR 1.2   Cardiac Enzymes: Recent Labs  Lab 04/19/19 1244  TROPONINI 0.03*   BNP (last 3 results) No results for input(s): PROBNP in the last 8760 hours. HbA1C: No results for input(s): HGBA1C in the last 72 hours. CBG: No results for input(s): GLUCAP in the last 168 hours. Lipid Profile: No results for input(s): CHOL, HDL, LDLCALC, TRIG, CHOLHDL, LDLDIRECT in the last 72 hours. Thyroid Function Tests: No results for input(s): TSH, T4TOTAL, FREET4, T3FREE, THYROIDAB in the last 72 hours. Anemia Panel: Recent Labs    04/19/19 1809  FERRITIN 168   Sepsis Labs: Recent Labs  Lab 04/19/19 1244 04/19/19 1809  PROCALCITON  --  0.82  LATICACIDVEN 1.0  --     Recent Results (from the past 240 hour(s))  SARS Coronavirus 2 (CEPHEID- Performed in [place] hospital lab), Hosp Order     Status: None   Collection Time: 04/19/19 12:14 PM  Result Value Ref Range Status   SARS Coronavirus 2 NEGATIVE NEGATIVE Final    Comment: (NOTE) If result is NEGATIVE SARS-CoV-2 target nucleic acids are NOT DETECTED. The SARS-CoV-2 RNA is generally detectable in upper and lower  respiratory specimens during the acute phase of infection. The lowest  concentration of SARS-CoV-2 viral copies this assay can  detect is 250  copies / mL. A negative result does not preclude SARS-CoV-2 infection  and should not be used as the sole basis for treatment or other  patient management decisions.  A negative result may occur with  improper specimen collection / handling, submission of specimen other  than nasopharyngeal swab, presence of viral mutation(s) within the  areas targeted by this assay, and inadequate number of viral copies  (<250 copies / mL). A negative result must be combined with clinical  observations, patient history, and epidemiological information. If result is POSITIVE SARS-CoV-2 target nucleic acids are DETECTED. The SARS-CoV-2 RNA is generally detectable in upper and lower  respiratory specimens dur ing the acute phase of infection.  Positive  results are indicative of active infection with SARS-CoV-2.  Clinical  correlation with patient history and other diagnostic information is  necessary to determine patient infection status.  Positive results do  not rule out bacterial infection or co-infection with other viruses. If result is PRESUMPTIVE POSTIVE SARS-CoV-2 nucleic acids MAY  BE PRESENT.   A presumptive positive result was obtained on the submitted specimen  and confirmed on repeat testing.  While 2019 novel coronavirus  (SARS-CoV-2) nucleic acids may be present in the submitted sample  additional confirmatory testing may be necessary for epidemiological  and / or clinical management purposes  to differentiate between  SARS-CoV-2 and other Sarbecovirus currently known to infect humans.  If clinically indicated additional testing with an alternate test  methodology [phone]) is advised. The SARS-CoV-2 RNA is generally  detectable in upper and lower respiratory sp ecimens during the acute  phase of infection. The expected result is Negative. Fact Sheet for Patients:  StrictlyIdeas.no Fact Sheet for Healthcare  Providers: BankingDealers.co.za This test is not yet approved or cleared by the [place] FDA and has been authorized for detection and/or diagnosis of SARS-CoV-2 by FDA under an Emergency Use Authorization (EUA).  This EUA will remain in effect (meaning this test can be used) for the duration of the COVID-19 declaration under Section 564(b)(1) of the Act, 21 U.S.C. section 360bbb-3(b)(1), unless the authorization is terminated or revoked sooner. Performed at [facility], [place] [address] [place], [place] [address]   MRSA PCR Screening     Status: None   Collection Time: 04/19/19  5:25 PM  Result Value Ref Range Status   MRSA by PCR NEGATIVE NEGATIVE Final    Comment:        The GeneXpert MRSA Assay (FDA approved for NASAL specimens only), is one component of a comprehensive MRSA colonization surveillance program. It is not intended to diagnose MRSA infection nor to guide or monitor treatment for MRSA infections. Performed at [facility], [place] [address] [place], [place] [address]          Radiology Studies: Ct Head Wo Contrast  Result Date: 04/19/2019 CLINICAL DATA:  Headache EXAM: CT HEAD WITHOUT CONTRAST TECHNIQUE: Contiguous axial images were obtained from the base of the skull through the vertex without intravenous contrast. COMPARISON:  CT head dated 12/27/2018 FINDINGS: Brain: No evidence of acute infarction, hemorrhage, hydrocephalus, extra-axial collection or mass lesion/mass effect. Vascular: No hyperdense vessel or unexpected calcification. Skull: Normal. Negative for fracture or focal lesion. Sinuses/Orbits: No acute finding. The patient is status post bilateral cataract surgery. Other: None. IMPRESSION: No acute intracranial abnormality detected. Electronically Signed   By: Constance Holster M.D.   On: 04/19/2019 15:17   Dg Chest Port 1 View  Result Date: 04/19/2019 CLINICAL DATA:  Shortness of  breath EXAM: PORTABLE CHEST 1 VIEW COMPARISON:  04/19/2019 FINDINGS: Cardiac shadow is stable. Vascular congestion has improved slightly in the interval from the prior exam. Mild bibasilar atelectasis is now noted. No bony abnormality is seen. IMPRESSION: Improved vascular congestion when compare with the prior exam. Mild bibasilar atelectasis is noted. Electronically Signed   By: Inez Catalina M.D.   On: 04/19/2019 22:16   Dg Abd Acute W/chest  Result Date: 04/19/2019 CLINICAL DATA:  Cough, fever, shortness of breath, and vomiting. EXAM: DG ABDOMEN ACUTE W/ 1V CHEST COMPARISON:  CT abdomen pelvis dated Mar 31, 2019. Chest x-ray dated Mar 31, 2019. FINDINGS: The cardiomediastinal silhouette is normal in size. New mild pulmonary vascular congestion. No focal consolidation, pleural effusion, or pneumothorax. Unchanged linear bibasilar atelectasis/scarring. No acute osseous abnormality. There is no evidence of dilated bowel loops or free intraperitoneal air. No radiopaque calculi or other significant radiographic abnormality is seen. Unchanged splenomegaly. IMPRESSION: 1. No acute abnormality in the abdomen. 2. Unchanged splenomegaly.  3. New mild pulmonary vascular congestion. Electronically Signed   By: Titus Dubin M.D.   On: 04/19/2019 12:44        Scheduled Meds: . allopurinol  300 mg Oral Daily  . aspirin EC  81 mg Oral Daily  . brimonidine  1 drop Left Eye BID  . chlorhexidine  15 mL Mouth Rinse BID  . Chlorhexidine Gluconate Cloth  6 each Topical Daily  . cycloSPORINE  1 drop Both Eyes BID  . enoxaparin (LOVENOX) injection  40 mg Subcutaneous Q24H  . FLUoxetine  40 mg Oral Daily  . furosemide  40 mg Intravenous Daily  . latanoprost  1 drop Both Eyes QHS  . magnesium oxide  400 mg Oral BID  . mouth rinse  15 mL Mouth Rinse q12n4p  . mometasone-formoterol  2 puff Inhalation BID  . mycophenolate  180 mg Oral BID  . pantoprazole  40 mg Oral Daily  . prednisoLONE acetate  1 drop Left Eye  TID WC & HS  . prednisoLONE acetate  1 drop Right Eye Q breakfast  . predniSONE  7.5 mg Oral Q breakfast  . sodium chloride flush  3 mL Intravenous Q12H  . tacrolimus  0.5 mg Oral QHS  . tacrolimus  1 mg Oral Daily   Continuous Infusions: . sodium chloride    . ceFEPime (MAXIPIME) IV Stopped (04/20/19 0616)     LOS: 1 day    Time spent: 25 minutes    Barb Merino, MD [facility] Pager [phone]  If 7PM-7AM, please contact night-coverage [email or web address] Password [facility] 04/20/2019, 12:51 PM

## 2019-04-20 NOTE — Consult Note (Addendum)
Renal Service Consult Note [facility]  April [name] 04/20/2019 [name] Requesting Physician:  Dr Sloan Leiter, Raliegh Ip.   Reason for Consult:  Renal Tx pt w/ fevers and N/V HPI: The patient is a 63 y.o. year-old with hx of esrd sp kidney transplant , diast HF, GERD and asthma presented w/ fevers, N/V, cough and wheezing, hx of asthma.  Admitted and started on IV abx and nebs. CXR w/o infiltrates. BP's stable.  Rapid COVID negative. Asked to see for renal transplant.    Pt got 1 dose IV lasix 78m for vasc congestion on CXR w/ SOB.  Repeat CXR last night should improved congestion.  Pt is taking all her immunosuppressive meds properly and w/o vomiting.       Pt states typical wt's are 182- 183 lbs.  On admit was 181 lbs.  Got IV lasix and today is 177lb.    Labs OK except for tbili 3.2 w/ normal AST/ ALT.  No abd pain.  CT done recently of abdomen showed cystic native kidneys and normal appearing transplant kidney in RLQ.   ROS  denies CP  no joint pain   no HA  no blurry vision  no rash  no diarrhea  no nausea/ vomiting  no dysuria  no difficulty voiding  no change in urine color    Past Medical History  Past Medical History:  Diagnosis Date  . Arthritis   . Asthma   . Depression   . GERD (gastroesophageal reflux disease)   . Hyperlipidemia   . Hyperparathyroidism   . Polycystic kidney   . PONV (postoperative nausea and vomiting)    Past Surgical History  Past Surgical History:  Procedure Laterality Date  . 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 BILATERAL OOPHORECTOMY    . CARPAL TUNNEL RELEASE  2000  . CESAREAN SECTION    . INSERTION OF DIALYSIS CATHETER  03/31/2012   Procedure: INSERTION OF DIALYSIS CATHETER;  Surgeon: CAngelia Mould MD;  [facility]  Service: Vascular;  Laterality: N/A;  insertion of dialysis catheter right internal jugular vein  . 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 ORIF TIBIA FRACTURE  Left 12/23/2018   Procedure: OPEN REDUCTION INTERNAL FIXATION (ORIF) TIBIA FRACTURE;  Surgeon: HShona Needles MD;  [facility]  Service: Orthopedics;  Laterality: Left;  . ORIF TIBIA PLATEAU Right 12/23/2018   Procedure: OPEN REDUCTION INTERNAL FIXATION (ORIF) TIBIAL PLATEAU;  Surgeon: HShona Needles MD;  [facility]  Service: Orthopedics;  Laterality: Right;  . TONSILLECTOMY  1967  . TUBAL LIGATION  2010   Family History  Family History  Problem Relation Age of Onset  . Hypertension Mother   . Heart disease Father        CABG history  . Polycystic kidney disease Father   . Polycystic kidney disease Brother    Social History  reports that she quit smoking about 21 years ago. Her smoking use included cigarettes. She has a 3.75 pack-year smoking history. She has never used smokeless tobacco. She reports that she does not drink alcohol or use drugs. Allergies  Allergies  Allergen Reactions  . Infed [Iron Dextran] Other (See Comments)    Chest tightness  . Pentamidine Itching, Shortness Of Breath and Swelling  . Erythromycin [Erythromycin] Other (See Comments)    Mouth Ulcers  . Oxycodone Nausea Only and Nausea And Vomiting  . Erythromycin Rash    Causes breakout  in mouth  . Ultram [Tramadol Hcl] Anxiety   Home medications Prior to Admission medications   Medication Sig Start Date End Date Taking? Authorizing Provider  acetaminophen (TYLENOL) 500 MG tablet Take 1,000 mg by mouth every 6 (six) hours as needed for moderate pain.   Yes [provider]  acyclovir (ZOVIRAX) 400 MG tablet Take 400 mg by mouth 2 (two) times daily as needed (outbreaks).    Yes [provider]  albuterol (PROVENTIL HFA;VENTOLIN HFA) 108 (90 Base) MCG/ACT inhaler Inhale 2 puffs into the lungs every 4 (four) hours as needed for wheezing or shortness of breath. 07/16/16  Yes Ghimire, Henreitta Leber, MD  allopurinol (ZYLOPRIM) 300 MG tablet Take 300 mg by mouth daily.   Yes [provider]  aspirin EC 81 MG tablet Take 81 mg by mouth daily.   Yes [provider]  brimonidine (ALPHAGAN P) 0.1 % SOLN Place 1 drop 2 (two) times daily into the left eye.   Yes [provider]  cholecalciferol (VITAMIN D3) 25 MCG (1000 UT) tablet Take 1,000 Units by mouth every other day.    Yes [provider]  Colchicine 0.6 MG CAPS Take 0.6 mg by mouth daily as needed (gout).    Yes [provider]  cycloSPORINE (RESTASIS) 0.05 % ophthalmic emulsion Place 1 drop into both eyes 2 (two) times daily.   Yes [provider]  FLUoxetine (PROZAC) 40 MG capsule Take 40 mg by mouth daily.   Yes [provider]  furosemide (LASIX) 40 MG tablet Take 1 tablet by mouth daily.  04/20/14  Yes [provider]  HYDROcodone-acetaminophen (NORCO/VICODIN) 5-325 MG tablet Take 1-2 tablets by mouth every 4 (four) hours as needed for moderate pain. 12/29/18  Yes Spongberg, Audie Pinto, MD  ipratropium-albuterol (DUONEB) 0.5-2.5 (3) MG/3ML SOLN Take 3 mLs by nebulization every 6 (six) hours as needed (shortness of breath).  03/11/19  Yes [provider]  latanoprost (XALATAN) 0.005 % ophthalmic solution Place 1 drop into both eyes at bedtime.   Yes [provider]  magnesium oxide (MAG-OX) 400 MG tablet Take 400 mg by mouth 2 (two) times daily.   Yes [provider]  mycophenolate (MYFORTIC) 180 MG EC tablet Take 180 mg by mouth 2 (two) times daily.   Yes [provider]  ondansetron (ZOFRAN ODT) 4 MG disintegrating tablet 78m ODT q4 hours prn nausea/vomit Patient taking differently: Take 4 mg by mouth every 4 (four) hours as needed for nausea or vomiting.  03/31/19  Yes YDrenda Freeze MD  pantoprazole (PROTONIX) 40 MG tablet Take 40 mg by mouth daily.    Yes [provider]  prednisoLONE acetate (PRED FORTE) 1 % ophthalmic suspension Place 1 drop into both eyes See admin instructions. Instill 1 drop left  eye four times daily and 1 drop right eye once daily.   Yes [provider]  predniSONE (DELTASONE) 5 MG tablet Take 7.5 mg by mouth daily.    Yes [provider]  PROGRAF 1 MG capsule Take 1 mg by mouth daily. 03/22/19  Yes [provider]  SYMBICORT 80-4.5 MCG/ACT inhaler Inhale 2 puffs into the lungs 2 (two) times a day.  03/11/19  Yes [provider]  tacrolimus (PROGRAF) 0.5 MG capsule Take 0.5 mg by mouth at bedtime. Takes brand name Prograf   Yes [provider]  TRAVATAN Z 0.004 % SOLN ophthalmic solution Place 1 drop into both eyes at bedtime. 07/03/16  Yes [provider]  vitamin B-12 (CYANOCOBALAMIN) 100 MCG tablet Take 100 mcg by mouth daily.   Yes [provider]  enoxaparin (LOVENOX) 40 MG/0.4ML injection Inject 0.4 mLs (40 mg total) into the skin daily for 28 days. Patient not taking: Reported on 04/19/2019 12/30/18 01/27/19  Marcell Anger, MD   Liver Function Tests Recent Labs  Lab 04/19/19 1243 04/20/19 0250  AST 22 19  ALT 21 18  ALKPHOS 114 102  BILITOT 3.9* 3.6*  PROT 7.6 7.0  ALBUMIN 3.2* 2.8*   Recent Labs  Lab 04/19/19 1243  LIPASE 22   CBC Recent Labs  Lab 04/19/19 1244 04/20/19 0250  WBC 18.1* 16.8*  NEUTROABS 15.6* 14.1*  HGB 10.8* 10.5*  HCT 36.0 35.8*  MCV 94.5 92.5  PLT 178 532   Basic Metabolic Panel Recent Labs  Lab 04/19/19 1243 04/20/19 0250  NA 131* 130*  K 4.2 4.0  CL 97* 97*  CO2 24 21*  GLUCOSE 111* 98  BUN 25* 28*  CREATININE 1.05* 1.22*  CALCIUM 9.5 9.2   Iron/TIBC/Ferritin/ %Sat    Component Value Date/Time   IRON 68 01/19/2012 0904   TIBC 227 (L) 01/19/2012 0904   FERRITIN 168 04/19/2019 1809   IRONPCTSAT 30 01/19/2012 0904    Vitals:   04/20/19 0500 04/20/19 0700 04/20/19 0800 04/20/19 0900  BP: (!) 123/47 (!) 116/46 (!) 114/50 (!) 121/44  Pulse: (!) 106 (!) 102 100 99  Resp: (!) 31 (!) 24 (!) 21 (!) 27  Temp:   97.9 F (36.6 C)   TempSrc:   Oral    SpO2: 99% 99% 99% 100%  Weight: 80.4 kg     Height:       Exam Gen tired appearing, no distress, chronically ill appearing, alert No rash, cyanosis or gangrene Sclera anicteric, throat clear and somewhat dry  No jvd or bruits Chest clear bilat to bases RRR no MRG Abd soft ntnd no mass or ascites +bs, RLQ transplant nontender GU deferred MS no joint effusions or deformity Ext no LE  edema, no wounds or ulcers Neuro is alert, Ox 3 , nf    Home meds:  - pred 7.5 qd/ mycophenolate 180 bid/ prograf 46m am and 0.5 mg pm  - pantoprazole 40/ allopurinol 300 qd  - aspirin 81  - furosemide 40 qd  - fluoxetine 40 qd/ prn norco - ipratropium-albuterol nebs prn  - prn's/ vitamins/ supplements/ eyedrops   UA negative, 100 prot  Rapid COVID 5/8 and 5/27 negative  Send-out COVID 5/27 - pending   CXR 5/27 12 pm - 1. No acute abnormality in the abdomen. 2. Unchanged splenomegaly. 3. New mild pulmonary vascular congestion.  CXR 5/27 10 pm - Improved vascular congestion when compare with the prior exam. Mild bibasilar atelectasis is noted.  Na 130  K 4  CO2 21  BUN 28  CR 1.22  Alb 2.8  Ca 9.2  AST/ ALT wnl   Tbili 3.6  eGFR 48   WBC 16k   Hb 10.5  plt 174   Blood cx's neg x 2   Renal CT stone study 03/31/19 > Hepatobiliary: Scattered cysts are noted throughout the liver. The gallbladder is within normal limits....  Adrenals/Urinary Tract: Adrenal glands are within normal limits. The native kidneys demonstrate multiple cysts consistent with polycystic kidney disease. No obstructive changes are seen. No obstructive changes are noted in the native kidneys. Bladder is well distended. A transplant kidney is noted in the right lower quadrant  without obstructive change or renal calculi. No inflammatory changes are seen.   Assessment: 1. Renal transplant (2013)- creat at baseline 1.2. UA negative, grossly normal appearing on CT abdomen from 5/8, nontender.  Keeping down her Tx meds here.  No  signs of rejection. Cont meds 2. Volume - don't think she is vol overloaded, dry if anything. CXR was a poor study/ poor inspiration. Will give IVF's gently and dc lasix.  3. Nausea/ vomiting/ fever - similar admit Nov 2019 w/o specific diagnosis. no abd pain now, renal Tx is not inflamed/ swollen on CT from recent ED visit. Tbili has been off and on elevated dating back to 09/2018 admission. Will d/w PMD.  AST/ ALT not elevated, no jaundice or biliary changes on recent CT abdomen.  4. H/o asthma 5. H/o fall and bilat knee/ tibial fractures - underwent ORIF bilat LE fractures on 1/31 by orthopedics    Plan: 1. Will follow.       Kelly Splinter  MD 04/20/2019, 3:19 PM

## 2019-04-21 ENCOUNTER — Inpatient Hospital Stay (HOSPITAL_COMMUNITY): Payer: Medicare Other

## 2019-04-21 DIAGNOSIS — I5033 Acute on chronic diastolic (congestive) heart failure: Secondary | ICD-10-CM

## 2019-04-21 LAB — BASIC METABOLIC PANEL
Anion gap: 10 (ref 5–15)
BUN: 36 mg/dL — ABNORMAL HIGH (ref 8–23)
CO2: 23 mmol/L (ref 22–32)
Calcium: 8.9 mg/dL (ref 8.9–10.3)
Chloride: 98 mmol/L (ref 98–111)
Creatinine, Ser: 1.3 mg/dL — ABNORMAL HIGH (ref 0.44–1.00)
GFR calc Af Amer: 51 mL/min — ABNORMAL LOW (ref >60)
GFR calc non Af Amer: 44 mL/min — ABNORMAL LOW (ref >60)
Glucose, Bld: 103 mg/dL — ABNORMAL HIGH (ref 70–99)
Potassium: 4 mmol/L (ref 3.5–5.1)
Sodium: 131 mmol/L — ABNORMAL LOW (ref 135–145)

## 2019-04-21 LAB — CBC WITH DIFFERENTIAL/PLATELET
Abs Immature Granulocytes: 0.09 10*3/uL — ABNORMAL HIGH (ref 0.00–0.07)
Basophils Absolute: 0 10*3/uL (ref 0.0–0.1)
Basophils Relative: 0 %
Eosinophils Absolute: 0.1 10*3/uL (ref 0.0–0.5)
Eosinophils Relative: 1 %
HCT: 32.1 % — ABNORMAL LOW (ref 36.0–46.0)
Hemoglobin: 9.2 g/dL — ABNORMAL LOW (ref 12.0–15.0)
Immature Granulocytes: 1 %
Lymphocytes Relative: 10 %
Lymphs Abs: 1.1 10*3/uL (ref 0.7–4.0)
MCH: 27.2 pg (ref 26.0–34.0)
MCHC: 28.7 g/dL — ABNORMAL LOW (ref 30.0–36.0)
MCV: 95 fL (ref 80.0–100.0)
Monocytes Absolute: 0.4 10*3/uL (ref 0.1–1.0)
Monocytes Relative: 4 %
Neutro Abs: 8.7 10*3/uL — ABNORMAL HIGH (ref 1.7–7.7)
Neutrophils Relative %: 84 %
Platelets: 148 10*3/uL — ABNORMAL LOW (ref 150–400)
RBC: 3.38 MIL/uL — ABNORMAL LOW (ref 3.87–5.11)
RDW: 19.2 % — ABNORMAL HIGH (ref 11.5–15.5)
WBC: 10.3 10*3/uL (ref 4.0–10.5)
nRBC: 0 % (ref 0.0–0.2)

## 2019-04-21 LAB — ECHOCARDIOGRAM COMPLETE
Height: 62 in
Weight: 2765.45 oz

## 2019-04-21 LAB — NOVEL CORONAVIRUS, NAA (HOSP ORDER, SEND-OUT TO REF LAB; TAT 18-24 HRS): SARS-CoV-2, NAA: NOT DETECTED

## 2019-04-21 MED ORDER — PREDNISONE 20 MG PO TABS
40.0000 mg | ORAL_TABLET | Freq: Every day | ORAL | Status: DC
  Administered 2019-04-21 – 2019-04-23 (×3): 40 mg via ORAL
  Filled 2019-04-21 (×3): qty 2

## 2019-04-21 NOTE — Progress Notes (Signed)
PROGRESS NOTE    April [name]  YPP:[id number] DOB: Jan 18, 1956 DOA: 04/19/2019 PCP: [name], MD    Brief Narrative:  63 year old female with history of asthma, GERD, ESRD status post renal transplant and chronic diastolic heart failure who was admitted to the hospital with about 3 days of shortness of breath and nausea and vomiting.  Patient reported temperature 101.7 at home. In the emergency room, patient had low-grade temperature 99.5.  She was tachycardic and tachypneic.  BNP 1000.  WBC 18,000.  CT head was normal.  Chest x-ray showed pulmonary vascular congestion.  COVID-19 was negative.  She was given IV Lasix and admitted to the hospital.   Assessment & Plan:   Active Problems:   Asthma, mild intermittent   GERD (gastroesophageal reflux disease)   Renal transplant recipient   Acute respiratory failure with hypoxia (HCC)   Acute on chronic diastolic CHF (congestive heart failure) (HCC)  Acute respiratory failure with hypoxia: Initially suspected with acute on chronic diastolic heart failure.  However, patient was clinically dehydrated.  Treated with IV fluids with some clinical improvement today. Her creatinine slightly elevated today.  Echocardiogram pending from today. Keep on oxygen to keep saturations more than 90%.  Sirs: Probable pneumonia, bacterial versus atypical.   Suspected pulmonary source.  Patient does have pulmonary symptoms including cough and wheezing.  She does have history of asthma.  Currently remains on cefepime.  Cultures are pending.  Blood pressures are fairly stable.  Continue cefepime until clinical improvement or final cultures.  Repeat COVID-19 test pending. aggressive bronchodilator therapy, cough and deep breathing exercises and incentive spirometry. Since patient has pulmonary symptoms, will continue COVID-19 precautions until repeat test resulted. Patient is on maintenance prednisone at home, will increase doses to 40 mg once a day for the  next few days.  History of kidney transplant for ESRD: Renal functions more or less stable.  Patient is on prednisone and Prograf.  Fairly stable.  Followed by nephrology.  Depression: On Prozac.  Abnormal bilirubin: Patient recently has elevated bilirubin level, transaminase normal.  Cause unknown.  Will request follow-up with transplant nephrology team.  DVT prophylaxis: Lovenox subcu Code Status: Full code Family Communication: None Disposition Plan: Inpatient   Consultants:   Nephrology  Procedures:   None  Antimicrobials:   Cefepime, 04/19/2019----   Subjective: Patient was seen and examined.  Afebrile overnight.  Remains on room air today.  Has some dry cough.  Mostly feels chest tightness and tightness at epigastrium. Patient walks with a walker because she had injury to both legs recently. Remains on room air overnight. Urine output 1350 mL over last 24 hours.  Objective: Vitals:   04/21/19 0400 04/21/19 0500 04/21/19 0700 04/21/19 0800  BP: (!) 110/54 (!) 115/49 (!) 106/37 (!) 115/46  Pulse: 81 79 88 89  Resp: (!) 30 (!) 21 (!) 30 (!) 29  Temp:      TempSrc:      SpO2: 96% 98% 96% 96%  Weight:  78.4 kg    Height:        Intake/Output Summary (Last 24 hours) at 04/21/2019 0859 Last data filed at 04/21/2019 0600 Gross per 24 hour  Intake 1139.6 ml  Output 1350 ml  Net -210.4 ml   Filed Weights   04/19/19 1112 04/20/19 0500 04/21/19 0500  Weight: 82.5 kg 80.4 kg 78.4 kg    Examination:  Physical Exam  Constitutional: She is oriented to person, place, and time. She appears well-developed and well-nourished. No  distress.  HENT:  Head: Normocephalic and atraumatic.  Mouth/Throat: No oropharyngeal exudate.  Eyes: Pupils are equal, round, and reactive to light. EOM are normal. Left eye exhibits no discharge.  Neck: Normal range of motion. Neck supple.  Cardiovascular: Normal rate and regular rhythm.  Murmur heard. Soft systolic murmur at apex.   Respiratory: Effort normal and breath sounds normal.  Patient has some expiratory crackles and mostly conducted airway sounds.  Bilateral equal air entry.  GI: Soft. Bowel sounds are normal. She exhibits no distension.  Musculoskeletal: Normal range of motion.        General: Edema present.     Comments: Trace bilateral pedal edema.  Lymphadenopathy:    She has no cervical adenopathy.  Neurological: She is alert and oriented to person, place, and time.  Skin: Skin is warm and dry.  Psychiatric: She has a normal mood and affect. Judgment normal.     Data Reviewed: I have personally reviewed following labs and imaging studies  CBC: Recent Labs  Lab 04/19/19 1244 04/20/19 0250 04/21/19 0233  WBC 18.1* 16.8* 10.3  NEUTROABS 15.6* 14.1* 8.7*  HGB 10.8* 10.5* 9.2*  HCT 36.0 35.8* 32.1*  MCV 94.5 92.5 95.0  PLT 178 174 409*   Basic Metabolic Panel: Recent Labs  Lab 04/19/19 1243 04/20/19 0250 04/21/19 0233  NA 131* 130* 131*  K 4.2 4.0 4.0  CL 97* 97* 98  CO2 24 21* 23  GLUCOSE 111* 98 103*  BUN 25* 28* 36*  CREATININE 1.05* 1.22* 1.30*  CALCIUM 9.5 9.2 8.9   GFR: Estimated Creatinine Clearance: 44 mL/min (A) (by C-G formula based on SCr of 1.3 mg/dL (H)). Liver Function Tests: Recent Labs  Lab 04/19/19 1243 04/20/19 0250  AST 22 19  ALT 21 18  ALKPHOS 114 102  BILITOT 3.9* 3.6*  PROT 7.6 7.0  ALBUMIN 3.2* 2.8*   Recent Labs  Lab 04/19/19 1243  LIPASE 22   No results for input(s): AMMONIA in the last 168 hours. Coagulation Profile: Recent Labs  Lab 04/19/19 1244  INR 1.2   Cardiac Enzymes: Recent Labs  Lab 04/19/19 1244  TROPONINI 0.03*   BNP (last 3 results) No results for input(s): PROBNP in the last 8760 hours. HbA1C: No results for input(s): HGBA1C in the last 72 hours. CBG: No results for input(s): GLUCAP in the last 168 hours. Lipid Profile: No results for input(s): CHOL, HDL, LDLCALC, TRIG, CHOLHDL, LDLDIRECT in the last 72  hours. Thyroid Function Tests: No results for input(s): TSH, T4TOTAL, FREET4, T3FREE, THYROIDAB in the last 72 hours. Anemia Panel: Recent Labs    04/19/19 1809  FERRITIN 168   Sepsis Labs: Recent Labs  Lab 04/19/19 1244 04/19/19 1809  PROCALCITON  --  0.82  LATICACIDVEN 1.0  --     Recent Results (from the past 240 hour(s))  SARS Coronavirus 2 (CEPHEID- Performed in [place] hospital lab), Hosp Order     Status: None   Collection Time: 04/19/19 12:14 PM  Result Value Ref Range Status   SARS Coronavirus 2 NEGATIVE NEGATIVE Final    Comment: (NOTE) If result is NEGATIVE SARS-CoV-2 target nucleic acids are NOT DETECTED. The SARS-CoV-2 RNA is generally detectable in upper and lower  respiratory specimens during the acute phase of infection. The lowest  concentration of SARS-CoV-2 viral copies this assay can detect is 250  copies / mL. A negative result does not preclude SARS-CoV-2 infection  and should not be used as the sole basis for treatment  or other  patient management decisions.  A negative result may occur with  improper specimen collection / handling, submission of specimen other  than nasopharyngeal swab, presence of viral mutation(s) within the  areas targeted by this assay, and inadequate number of viral copies  (<250 copies / mL). A negative result must be combined with clinical  observations, patient history, and epidemiological information. If result is POSITIVE SARS-CoV-2 target nucleic acids are DETECTED. The SARS-CoV-2 RNA is generally detectable in upper and lower  respiratory specimens dur ing the acute phase of infection.  Positive  results are indicative of active infection with SARS-CoV-2.  Clinical  correlation with patient history and other diagnostic information is  necessary to determine patient infection status.  Positive results do  not rule out bacterial infection or co-infection with other viruses. If result is PRESUMPTIVE  POSTIVE SARS-CoV-2 nucleic acids MAY BE PRESENT.   A presumptive positive result was obtained on the submitted specimen  and confirmed on repeat testing.  While 2019 novel coronavirus  (SARS-CoV-2) nucleic acids may be present in the submitted sample  additional confirmatory testing may be necessary for epidemiological  and / or clinical management purposes  to differentiate between  SARS-CoV-2 and other Sarbecovirus currently known to infect humans.  If clinically indicated additional testing with an alternate test  methodology [phone]) is advised. The SARS-CoV-2 RNA is generally  detectable in upper and lower respiratory sp ecimens during the acute  phase of infection. The expected result is Negative. Fact Sheet for Patients:  StrictlyIdeas.no Fact Sheet for Healthcare Providers: BankingDealers.co.za This test is not yet approved or cleared by the [place] FDA and has been authorized for detection and/or diagnosis of SARS-CoV-2 by FDA under an Emergency Use Authorization (EUA).  This EUA will remain in effect (meaning this test can be used) for the duration of the COVID-19 declaration under Section 564(b)(1) of the Act, 21 U.S.C. section 360bbb-3(b)(1), unless the authorization is terminated or revoked sooner. Performed at [facility], [place] [address] [place], [place] [address]   Culture, blood (routine x 2)     Status: None (Preliminary result)   Collection Time: 04/19/19 12:43 PM  Result Value Ref Range Status   Specimen Description   Final    BLOOD LEFT ANTECUBITAL Performed at [place] [address] [place], [place] [address]    Special Requests   Final    BOTTLES DRAWN AEROBIC AND ANAEROBIC Blood Culture adequate volume Performed at [place] [address], [place], [name] [address]    Culture   Final    NO GROWTH 1 DAY Performed at [facility], [place] [address] [place], [place] [address]    Report Status PENDING  Incomplete  Culture, blood (routine x 2)     Status: None (Preliminary result)   Collection Time: 04/19/19  1:00 PM  Result Value Ref Range Status   Specimen Description   Final    BLOOD RIGHT ANTECUBITAL Performed at [place] [address] [place], [place] [address]    Special Requests   Final    BOTTLES DRAWN AEROBIC AND ANAEROBIC Blood Culture adequate volume Performed at [place] [address], [place], [place] [address]    Culture   Final    NO GROWTH 1 DAY Performed at [facility], [place] [address], [place], [place] [address]    Report Status PENDING  Incomplete  Urine culture  Status: Abnormal   Collection Time: 04/19/19  5:03 PM  Result Value Ref Range Status   Specimen Description   Final    URINE, CLEAN CATCH Performed at [facility], [place] [address], [place], [place] [address]    Special Requests   Final    Immunocompromised Performed at [place] [facility], [place] [address], [place], [place] [address]    Culture (A)  Final    <10,000 COLONIES/mL INSIGNIFICANT GROWTH Performed at [place] [address] [place], [place] [address]    Report Status 04/20/2019 FINAL  Final  MRSA PCR Screening     Status: None   Collection Time: 04/19/19  5:25 PM  Result Value Ref Range Status   MRSA by PCR NEGATIVE NEGATIVE Final    Comment:        The GeneXpert MRSA Assay (FDA approved for NASAL specimens only), is one component of a comprehensive MRSA colonization surveillance program. It is not intended to diagnose MRSA infection nor to guide or monitor treatment for MRSA infections. Performed at [facility], [place] [address], [place],  [address]   Novel Coronavirus, NAA (hospital order; send-out to ref lab)     Status: None   Collection Time: 04/19/19  5:40 PM  Result  Value Ref Range Status   SARS-CoV-2, NAA NOT DETECTED NOT DETECTED Final    Comment: (NOTE) This test was developed and its performance characteristics determined by [employer]. This test has not been FDA cleared or approved. This test has been authorized by FDA under an Emergency Use Authorization (EUA). This test is only authorized for the duration of time the declaration that circumstances exist justifying the authorization of the emergency use of in vitro diagnostic tests for detection of SARS-CoV-2 virus and/or diagnosis of COVID-19 infection under section 564(b)(1) of the Act, 21 U.S.C. 557DUK-0(U)(5), unless the authorization is terminated or revoked sooner. When diagnostic testing is negative, the possibility of a false negative result should be considered in the context of a patient's recent exposures and the presence of clinical signs and symptoms consistent with COVID-19. An individual without symptoms of COVID-19 and who is not shedding SARS-CoV-2 virus would expect to have a negative (not detected) result in this assay. Performed  At: [facility] [place], [place] [id number] Rush Farmer MD EG:[id number]    [place]  Final    Comment: Performed at [place] [address] [place],  [address]         Radiology Studies: Ct Head Wo Contrast  Result Date: 04/19/2019 CLINICAL DATA:  Headache EXAM: CT HEAD WITHOUT CONTRAST TECHNIQUE: Contiguous axial images were obtained from the base of the skull through the vertex without intravenous contrast. COMPARISON:  CT head dated 12/27/2018 FINDINGS: Brain: No evidence of acute infarction, hemorrhage, hydrocephalus, extra-axial collection or mass lesion/mass effect. Vascular: No hyperdense vessel or unexpected calcification. Skull: Normal. Negative for fracture or focal lesion. Sinuses/Orbits: No acute finding. The patient is status post  bilateral cataract surgery. Other: None. IMPRESSION: No acute intracranial abnormality detected. Electronically Signed   By: Constance Holster M.D.   On: 04/19/2019 15:17   Dg Chest Port 1 View  Result Date: 04/19/2019 CLINICAL DATA:  Shortness of breath EXAM: PORTABLE CHEST 1 VIEW COMPARISON:  04/19/2019 FINDINGS: Cardiac shadow is stable. Vascular congestion has improved slightly in the interval from the prior exam. Mild bibasilar atelectasis is now noted. No bony abnormality is seen. IMPRESSION:  Improved vascular congestion when compare with the prior exam. Mild bibasilar atelectasis is noted. Electronically Signed   By: Inez Catalina M.D.   On: 04/19/2019 22:16   Dg Abd Acute W/chest  Result Date: 04/19/2019 CLINICAL DATA:  Cough, fever, shortness of breath, and vomiting. EXAM: DG ABDOMEN ACUTE W/ 1V CHEST COMPARISON:  CT abdomen pelvis dated Mar 31, 2019. Chest x-ray dated Mar 31, 2019. FINDINGS: The cardiomediastinal silhouette is normal in size. New mild pulmonary vascular congestion. No focal consolidation, pleural effusion, or pneumothorax. Unchanged linear bibasilar atelectasis/scarring. No acute osseous abnormality. There is no evidence of dilated bowel loops or free intraperitoneal air. No radiopaque calculi or other significant radiographic abnormality is seen. Unchanged splenomegaly. IMPRESSION: 1. No acute abnormality in the abdomen. 2. Unchanged splenomegaly. 3. New mild pulmonary vascular congestion. Electronically Signed   By: Titus Dubin M.D.   On: 04/19/2019 12:44        Scheduled Meds: . allopurinol  300 mg Oral Daily  . aspirin EC  81 mg Oral Daily  . brimonidine  1 drop Left Eye BID  . chlorhexidine  15 mL Mouth Rinse BID  . Chlorhexidine Gluconate Cloth  6 each Topical Daily  . cycloSPORINE  1 drop Both Eyes BID  . enoxaparin (LOVENOX) injection  40 mg Subcutaneous Q24H  . FLUoxetine  40 mg Oral Daily  . latanoprost  1 drop Both Eyes QHS  . magnesium oxide  400 mg  Oral BID  . mouth rinse  15 mL Mouth Rinse q12n4p  . mometasone-formoterol  2 puff Inhalation BID  . mycophenolate  180 mg Oral BID  . pantoprazole  40 mg Oral Daily  . prednisoLONE acetate  1 drop Left Eye TID WC & HS  . prednisoLONE acetate  1 drop Right Eye Q breakfast  . predniSONE  40 mg Oral Q breakfast  . sodium chloride flush  3 mL Intravenous Q12H  . tacrolimus  0.5 mg Oral QHS  . tacrolimus  1 mg Oral Daily   Continuous Infusions: . sodium chloride 75 mL/hr at 04/21/19 0828  . sodium chloride    . ceFEPime (MAXIPIME) IV Stopped (04/21/19 0539)     LOS: 2 days    Time spent: 25 minutes    Barb Merino, MD [facility] Pager [phone]  If 7PM-7AM, please contact night-coverage [email or web address] Password [email or web address] 04/21/2019, 8:59 AM

## 2019-04-21 NOTE — Progress Notes (Signed)
[facility] Progress Note  Subjective: "I feel much better", up in chair. WBC down to 8k and low grade temps resolving. BCx's negative. UCx <10K colonies.   Vitals:   04/21/19 0900 04/21/19 1000 04/21/19 1100 04/21/19 1200  BP: (!) 111/42 (!) 117/44 (!) 103/42 127/62  Pulse: 86 92 88 86  Resp: (!) 30 (!) 23 (!) 27 (!) 27  Temp:    98.3 F (36.8 C)  TempSrc:    Axillary  SpO2: 95% 95% 95% 97%  Weight:      Height:        Inpatient medications: . allopurinol  300 mg Oral Daily  . aspirin EC  81 mg Oral Daily  . brimonidine  1 drop Left Eye BID  . chlorhexidine  15 mL Mouth Rinse BID  . Chlorhexidine Gluconate Cloth  6 each Topical Daily  . cycloSPORINE  1 drop Both Eyes BID  . enoxaparin (LOVENOX) injection  40 mg Subcutaneous Q24H  . FLUoxetine  40 mg Oral Daily  . latanoprost  1 drop Both Eyes QHS  . magnesium oxide  400 mg Oral BID  . mouth rinse  15 mL Mouth Rinse q12n4p  . mometasone-formoterol  2 puff Inhalation BID  . mycophenolate  180 mg Oral BID  . pantoprazole  40 mg Oral Daily  . prednisoLONE acetate  1 drop Left Eye TID WC & HS  . prednisoLONE acetate  1 drop Right Eye Q breakfast  . predniSONE  40 mg Oral Q breakfast  . sodium chloride flush  3 mL Intravenous Q12H  . tacrolimus  0.5 mg Oral QHS  . tacrolimus  1 mg Oral Daily   . sodium chloride Stopped (04/21/19 1024)  . sodium chloride    . ceFEPime (MAXIPIME) IV Stopped (04/21/19 0539)   sodium chloride, acetaminophen **OR** acetaminophen, cyclobenzaprine, ondansetron (ZOFRAN) IV, sodium chloride flush    Exam: Gen looks a lot better, up in chair and cheerful Sclera anicteric, throat clear and moist No jvd or bruits Chest clear bilat to bases RRR no MRG Abd soft ntnd no mass or ascites +bs, RLQ transplant nontender Ext no LE  edema Neuro is alert, Ox 3 , nf    Home meds:  - pred 7.5 qd/ mycophenolate 180 bid/ prograf 1mg  am and 0.5 mg pm  - pantoprazole 40/ allopurinol 300  qd  - aspirin 81  - furosemide 40 qd  - fluoxetine 40 qd/ prn norco - ipratropium-albuterol nebs prn  - prn's/ vitamins/ supplements/ eyedrops   UA negative, 100 prot   CXR 5/27 12 pm - 1. No acute abnormality in the abdomen. 2. Unchanged splenomegaly. 3. New mild pulmonary vascular congestion.  CXR 5/27 10 pm - Improved vascular congestion when compare with the prior exam. Mild bibasilar atelectasis is noted.   Blood cx's neg x 2   Renal CT stone study 03/31/19 > Adrenals/Urinary Tract: Adrenal glands are within normal limits. The native kidneys demonstrate multiple cysts consistent with polycystic kidney disease. No obstructive changes are seen. No obstructive changes are noted in the native kidneys. Bladder is well distended. A transplant kidney is noted in the right lower quadrant without obstructive change or renal calculi. No inflammatory changes are seen.   Assessment: 1. Renal transplant (2013)- baseline creat 1.2. UA negative, grossly normal appearing on CT abdomen from 5/8, nontender.  taking all her po meds here. Feeling much better today.  No further recommendations. Will sign off.  2. Volume - feels better w/ IVF's  3. Nausea/ vomiting/ fever - similar admit Nov 2019. Covid neg x 2 here 4. H/o asthma 5. H/o fall and bilat knee/ tibial fractures - underwent ORIF bilat LE fractures on 1/31 by orthopedics  6. ^Tbili - 3-4 range, AST/ ALT wnl, asymptomatic, not sure cause, have d/w primary team    [facility] 04/21/2019, 2:56 PM  Iron/TIBC/Ferritin/ %Sat    Component Value Date/Time   IRON 68 01/19/2012 0904   TIBC 227 (L) 01/19/2012 0904   FERRITIN 168 04/19/2019 1809   IRONPCTSAT 30 01/19/2012 0904   Recent Labs  Lab 04/19/19 1244 04/20/19 0250 04/21/19 0233  NA  --  130* 131*  K  --  4.0 4.0  CL  --  97* 98  CO2  --  21* 23  GLUCOSE  --  98 103*  BUN  --  28* 36*  CREATININE  --  1.22* 1.30*  CALCIUM  --  9.2 8.9  ALBUMIN  --  2.8*  --    INR 1.2  --   --    Recent Labs  Lab 04/20/19 0250  AST 19  ALT 18  ALKPHOS 102  BILITOT 3.6*  PROT 7.0   Recent Labs  Lab 04/21/19 0233  WBC 10.3  HGB 9.2*  HCT 32.1*  PLT 148*

## 2019-04-21 NOTE — Progress Notes (Signed)
Echocardiogram 2D Echocardiogram has been performed.  April [name] 04/21/2019, 2:52 PM

## 2019-04-22 LAB — CBC WITH DIFFERENTIAL/PLATELET
Abs Immature Granulocytes: 0.04 10*3/uL (ref 0.00–0.07)
Basophils Absolute: 0 10*3/uL (ref 0.0–0.1)
Basophils Relative: 0 %
Eosinophils Absolute: 0 10*3/uL (ref 0.0–0.5)
Eosinophils Relative: 0 %
HCT: 30 % — ABNORMAL LOW (ref 36.0–46.0)
Hemoglobin: 8.7 g/dL — ABNORMAL LOW (ref 12.0–15.0)
Immature Granulocytes: 1 %
Lymphocytes Relative: 7 %
Lymphs Abs: 0.4 10*3/uL — ABNORMAL LOW (ref 0.7–4.0)
MCH: 27.8 pg (ref 26.0–34.0)
MCHC: 29 g/dL — ABNORMAL LOW (ref 30.0–36.0)
MCV: 95.8 fL (ref 80.0–100.0)
Monocytes Absolute: 0.1 10*3/uL (ref 0.1–1.0)
Monocytes Relative: 2 %
Neutro Abs: 5.3 10*3/uL (ref 1.7–7.7)
Neutrophils Relative %: 90 %
Platelets: 123 10*3/uL — ABNORMAL LOW (ref 150–400)
RBC: 3.13 MIL/uL — ABNORMAL LOW (ref 3.87–5.11)
RDW: 18.6 % — ABNORMAL HIGH (ref 11.5–15.5)
WBC: 5.9 10*3/uL (ref 4.0–10.5)
nRBC: 0 % (ref 0.0–0.2)

## 2019-04-22 LAB — BASIC METABOLIC PANEL
Anion gap: 9 (ref 5–15)
BUN: 44 mg/dL — ABNORMAL HIGH (ref 8–23)
CO2: 21 mmol/L — ABNORMAL LOW (ref 22–32)
Calcium: 9 mg/dL (ref 8.9–10.3)
Chloride: 99 mmol/L (ref 98–111)
Creatinine, Ser: 1.26 mg/dL — ABNORMAL HIGH (ref 0.44–1.00)
GFR calc Af Amer: 53 mL/min — ABNORMAL LOW (ref >60)
GFR calc non Af Amer: 46 mL/min — ABNORMAL LOW (ref >60)
Glucose, Bld: 161 mg/dL — ABNORMAL HIGH (ref 70–99)
Potassium: 4.7 mmol/L (ref 3.5–5.1)
Sodium: 129 mmol/L — ABNORMAL LOW (ref 135–145)

## 2019-04-22 NOTE — Progress Notes (Signed)
PROGRESS NOTE    April [name]  UTM:[id number] DOB: 18-Oct-1956 DOA: 04/19/2019 PCP: [name], MD    Brief Narrative:  63 year old female with history of asthma, GERD, ESRD status post renal transplant and chronic diastolic heart failure who was admitted to the hospital with about 3 days of shortness of breath and nausea and vomiting.  Patient reported temperature 101.7 at home. In the emergency room, patient had low-grade temperature 99.5.  She was tachycardic and tachypneic.  BNP 1000.  WBC 18,000.  CT head was normal.  Chest x-ray showed pulmonary vascular congestion.  COVID-19 was negative.  She was given IV Lasix and admitted to the hospital.   Assessment & Plan:   Active Problems:   Asthma, mild intermittent   GERD (gastroesophageal reflux disease)   Renal transplant recipient   Acute respiratory failure with hypoxia (HCC)   Acute on chronic diastolic CHF (congestive heart failure) (HCC)  Acute respiratory failure with hypoxia: Initially suspected with acute on chronic diastolic heart failure.  However, patient was clinically dehydrated.  Treated with IV fluids with clinical improvement today.  Renal functions normalizing. Echocardiogram with normal ejection fraction. Oxygenation improved.  Currently on room air.  Sirs: Probable pneumonia, bacterial versus atypical.   Suspected pulmonary source.  Patient did have pulmonary symptoms including cough and wheezing.  She does have history of asthma.  Treated with cefepime.  No positive data.  No evidence of ongoing infection.  We will discontinue antibiotics and monitor.  aggressive bronchodilator therapy, cough and deep breathing exercises and incentive spirometry. Patient is on maintenance prednisone at home, will stay on 40 mg once a day for the next few days.  History of kidney transplant for ESRD: Renal functions more or less stable.  Patient is on prednisone and Prograf.  Fairly stable.  Followed by  nephrology.  Depression: On Prozac.  Abnormal bilirubin: Patient recently has elevated bilirubin level, transaminase normal.  Cause unknown.  Fairly stable.  Will follow up with his provider as outpatient.  DVT prophylaxis: Lovenox subcu Code Status: Full code Family Communication: None Disposition Plan: Inpatient Advance activities.  Ambulate. Discontinue telemetry.  Discontinue IV fluids.  Discontinue antibiotics and monitor.  Anticipate discharge home tomorrow.  Consultants:   Nephrology, signed off  Procedures:   None  Antimicrobials:   Cefepime, 04/19/2019----04/22/2019   Subjective: Patient was seen and examined.  Afebrile overnight.  Remains on room air today.  Chest tightness improved.  Objective: Vitals:   04/21/19 2018 04/22/19 0500 04/22/19 0511 04/22/19 1050  BP: 139/79  120/68   Pulse: 80  73   Resp: 20  20   Temp: 98.2 F (36.8 C)  97.8 F (36.6 C)   TempSrc: Oral  Oral   SpO2: 96%  96% 99%  Weight:  80.6 kg    Height:        Intake/Output Summary (Last 24 hours) at 04/22/2019 1146 Last data filed at 04/22/2019 1003 Gross per 24 hour  Intake 1578.4 ml  Output 1300 ml  Net 278.4 ml   Filed Weights   04/20/19 0500 04/21/19 0500 04/22/19 0500  Weight: 80.4 kg 78.4 kg 80.6 kg    Examination:  Physical Exam  Constitutional: She is oriented to person, place, and time. She appears well-developed and well-nourished. No distress.  HENT:  Head: Normocephalic and atraumatic.  Mouth/Throat: No oropharyngeal exudate.  Eyes: Pupils are equal, round, and reactive to light. EOM are normal. Left eye exhibits no discharge.  Neck: Normal range of motion. Neck  supple.  Cardiovascular: Normal rate and regular rhythm.  Murmur heard. Soft systolic murmur at apex.  Respiratory: Effort normal and breath sounds normal.  Patient has some expiratory crackles and mostly conducted airway sounds.  Bilateral equal air entry.  GI: Soft. Bowel sounds are normal. She  exhibits no distension.  Musculoskeletal: Normal range of motion.        General: Edema present.     Comments: Trace bilateral pedal edema.  Lymphadenopathy:    She has no cervical adenopathy.  Neurological: She is alert and oriented to person, place, and time.  Skin: Skin is warm and dry.  Psychiatric: She has a normal mood and affect. Judgment normal.     Data Reviewed: I have personally reviewed following labs and imaging studies  CBC: Recent Labs  Lab 04/19/19 1244 04/20/19 0250 04/21/19 0233 04/22/19 0505  WBC 18.1* 16.8* 10.3 5.9  NEUTROABS 15.6* 14.1* 8.7* 5.3  HGB 10.8* 10.5* 9.2* 8.7*  HCT 36.0 35.8* 32.1* 30.0*  MCV 94.5 92.5 95.0 95.8  PLT 178 174 148* 616*   Basic Metabolic Panel: Recent Labs  Lab 04/19/19 1243 04/20/19 0250 04/21/19 0233 04/22/19 0505  NA 131* 130* 131* 129*  K 4.2 4.0 4.0 4.7  CL 97* 97* 98 99  CO2 24 21* 23 21*  GLUCOSE 111* 98 103* 161*  BUN 25* 28* 36* 44*  CREATININE 1.05* 1.22* 1.30* 1.26*  CALCIUM 9.5 9.2 8.9 9.0   GFR: Estimated Creatinine Clearance: 46.1 mL/min (A) (by C-G formula based on SCr of 1.26 mg/dL (H)). Liver Function Tests: Recent Labs  Lab 04/19/19 1243 04/20/19 0250  AST 22 19  ALT 21 18  ALKPHOS 114 102  BILITOT 3.9* 3.6*  PROT 7.6 7.0  ALBUMIN 3.2* 2.8*   Recent Labs  Lab 04/19/19 1243  LIPASE 22   No results for input(s): AMMONIA in the last 168 hours. Coagulation Profile: Recent Labs  Lab 04/19/19 1244  INR 1.2   Cardiac Enzymes: Recent Labs  Lab 04/19/19 1244  TROPONINI 0.03*   BNP (last 3 results) No results for input(s): PROBNP in the last 8760 hours. HbA1C: No results for input(s): HGBA1C in the last 72 hours. CBG: No results for input(s): GLUCAP in the last 168 hours. Lipid Profile: No results for input(s): CHOL, HDL, LDLCALC, TRIG, CHOLHDL, LDLDIRECT in the last 72 hours. Thyroid Function Tests: No results for input(s): TSH, T4TOTAL, FREET4, T3FREE, THYROIDAB in the last  72 hours. Anemia Panel: Recent Labs    04/19/19 1809  FERRITIN 168   Sepsis Labs: Recent Labs  Lab 04/19/19 1244 04/19/19 1809  PROCALCITON  --  0.82  LATICACIDVEN 1.0  --     Recent Results (from the past 240 hour(s))  SARS Coronavirus 2 (CEPHEID- Performed in [place] hospital lab), Hosp Order     Status: None   Collection Time: 04/19/19 12:14 PM  Result Value Ref Range Status   SARS Coronavirus 2 NEGATIVE NEGATIVE Final    Comment: (NOTE) If result is NEGATIVE SARS-CoV-2 target nucleic acids are NOT DETECTED. The SARS-CoV-2 RNA is generally detectable in upper and lower  respiratory specimens during the acute phase of infection. The lowest  concentration of SARS-CoV-2 viral copies this assay can detect is 250  copies / mL. A negative result does not preclude SARS-CoV-2 infection  and should not be used as the sole basis for treatment or other  patient management decisions.  A negative result may occur with  improper specimen collection / handling, submission  of specimen other  than nasopharyngeal swab, presence of viral mutation(s) within the  areas targeted by this assay, and inadequate number of viral copies  (<250 copies / mL). A negative result must be combined with clinical  observations, patient history, and epidemiological information. If result is POSITIVE SARS-CoV-2 target nucleic acids are DETECTED. The SARS-CoV-2 RNA is generally detectable in upper and lower  respiratory specimens dur ing the acute phase of infection.  Positive  results are indicative of active infection with SARS-CoV-2.  Clinical  correlation with patient history and other diagnostic information is  necessary to determine patient infection status.  Positive results do  not rule out bacterial infection or co-infection with other viruses. If result is PRESUMPTIVE POSTIVE SARS-CoV-2 nucleic acids MAY BE PRESENT.   A presumptive positive result was obtained on the submitted specimen  and  confirmed on repeat testing.  While 2019 novel coronavirus  (SARS-CoV-2) nucleic acids may be present in the submitted sample  additional confirmatory testing may be necessary for epidemiological  and / or clinical management purposes  to differentiate between  SARS-CoV-2 and other Sarbecovirus currently known to infect humans.  If clinically indicated additional testing with an alternate test  methodology [phone]) is advised. The SARS-CoV-2 RNA is generally  detectable in upper and lower respiratory sp ecimens during the acute  phase of infection. The expected result is Negative. Fact Sheet for Patients:  StrictlyIdeas.no Fact Sheet for Healthcare Providers: BankingDealers.co.za This test is not yet approved or cleared by the [place] FDA and has been authorized for detection and/or diagnosis of SARS-CoV-2 by FDA under an Emergency Use Authorization (EUA).  This EUA will remain in effect (meaning this test can be used) for the duration of the COVID-19 declaration under Section 564(b)(1) of the Act, 21 U.S.C. section 360bbb-3(b)(1), unless the authorization is terminated or revoked sooner. Performed at [facility], [place] [address] [place], [place] [address]   Culture, blood (routine x 2)     Status: None (Preliminary result)   Collection Time: 04/19/19 12:43 PM  Result Value Ref Range Status   Specimen Description   Final    BLOOD LEFT ANTECUBITAL Performed at [facility] [address] [place], [place] [address]    Special Requests   Final    BOTTLES DRAWN AEROBIC AND ANAEROBIC Blood Culture adequate volume Performed at [facility] [address] [place], [place] [address]    Culture   Final    NO GROWTH 2 DAYS Performed at [facility] [address], [place], [place] [address]    Report Status PENDING  Incomplete  Culture, blood (routine x 2)      Status: None (Preliminary result)   Collection Time: 04/19/19  1:00 PM  Result Value Ref Range Status   Specimen Description   Final    BLOOD RIGHT ANTECUBITAL Performed at [facility] [address] [place], [place] [address]    Special Requests   Final    BOTTLES DRAWN AEROBIC AND ANAEROBIC Blood Culture adequate volume Performed at [facility] [address] [place], [place] [address]    Culture   Final    NO GROWTH 2 DAYS Performed at [facility] [address], [place],  [address]    Report Status PENDING  Incomplete  Urine culture     Status: Abnormal   Collection Time: 04/19/19  5:03 PM  Result Value Ref Range Status  Specimen Description   Final    URINE, CLEAN CATCH Performed at [facility], [place] [address] [place], [place] [address]    Special Requests   Final    Immunocompromised Performed at [facility], [place] [address], [place], [place] [address]    Culture (A)  Final    <10,000 COLONIES/mL INSIGNIFICANT GROWTH Performed at [facility] [address], [place], [place] [address]    Report Status 04/20/2019 FINAL  Final  MRSA PCR Screening     Status: None   Collection Time: 04/19/19  5:25 PM  Result Value Ref Range Status   MRSA by PCR NEGATIVE NEGATIVE Final    Comment:        The GeneXpert MRSA Assay (FDA approved for NASAL specimens only), is one component of a comprehensive MRSA colonization surveillance program. It is not intended to diagnose MRSA infection nor to guide or monitor treatment for MRSA infections. Performed at [facility], [place] [address], [place], [place] [address]   Novel Coronavirus, NAA (hospital order; send-out to ref lab)     Status: None   Collection Time: 04/19/19  5:40 PM  Result Value Ref Range Status   SARS-CoV-2, NAA NOT DETECTED NOT DETECTED Final    Comment: (NOTE) This test was developed and its  performance characteristics determined by [employer]. This test has not been FDA cleared or approved. This test has been authorized by FDA under an Emergency Use Authorization (EUA). This test is only authorized for the duration of time the declaration that circumstances exist justifying the authorization of the emergency use of in vitro diagnostic tests for detection of SARS-CoV-2 virus and/or diagnosis of COVID-19 infection under section 564(b)(1) of the Act, 21 U.S.C. 267TIW-5(Y)(0), unless the authorization is terminated or revoked sooner. When diagnostic testing is negative, the possibility of a false negative result should be considered in the context of a patient's recent exposures and the presence of clinical signs and symptoms consistent with COVID-19. An individual without symptoms of COVID-19 and who is not shedding SARS-CoV-2 virus would expect to have a negative (not detected) result in this assay. Performed  At: [facility] [place], [place] [id number] Rush Farmer MD NL:[id number]    [place]  Final    Comment: Performed at [facility] [address], [place],  [address]         Radiology Studies: No results found.      Scheduled Meds: . allopurinol  300 mg Oral Daily  . aspirin EC  81 mg Oral Daily  . brimonidine  1 drop Left Eye BID  . chlorhexidine  15 mL Mouth Rinse BID  . Chlorhexidine Gluconate Cloth  6 each Topical Daily  . cycloSPORINE  1 drop Both Eyes BID  . enoxaparin (LOVENOX) injection  40 mg Subcutaneous Q24H  . FLUoxetine  40 mg Oral Daily  . latanoprost  1 drop Both Eyes QHS  . magnesium oxide  400 mg Oral BID  . mouth rinse  15 mL Mouth Rinse q12n4p  . mometasone-formoterol  2 puff Inhalation BID  . mycophenolate  180 mg Oral BID  . pantoprazole  40 mg Oral Daily  . prednisoLONE acetate  1 drop Left Eye TID WC & HS  . prednisoLONE acetate  1 drop  Right Eye Q breakfast  . predniSONE  40 mg Oral Q breakfast  . sodium chloride flush  3 mL Intravenous Q12H  .  tacrolimus  0.5 mg Oral QHS  . tacrolimus  1 mg Oral Daily   Continuous Infusions: . sodium chloride       LOS: 3 days    Time spent: 25 minutes    Barb Merino, MD [facility] Pager [phone]  If 7PM-7AM, please contact night-coverage [email or web address] Password [email or web address] 04/22/2019, 11:46 AM

## 2019-04-22 NOTE — Evaluation (Signed)
Occupational Therapy Evaluation Patient Details Name: April [name] MRN: [id number] DOB: [birth year] Today's Date: 04/22/2019    History of Present Illness 63 year old female with history of recent R tibial plateau fx s/p ORIF and L tibia fx s/p ORIF 12/23/2018,  asthma, GERD, ESRD status post renal transplant and chronic diastolic heart failure who was admitted to the hospital with about 3 days of shortness of breath and nausea and vomiting.    Clinical Impression   Pt admitted with the above diagnoses and presents with below problem list. Pt will benefit from continued acute OT to address the below listed deficits and maximize independence with basic ADLs prior to d/c home. PTA pt was setup to supervision with basic ADLs, using a rw since BLE surgeries earlier this year. Pt reported feeling lightheaded upon sitting forward in recliner and throughout session. Pt reports she feels this at home at times as well and she suspects it's med related. Pt is currently min guard with LB ADLs, functional mobility and toilet/tub (tub bench) transfers.      Follow Up Recommendations  Home health OT;Other (comment)(continue HH therapy)    Equipment Recommendations  None recommended by OT    Recommendations for Other Services       Precautions / Restrictions Precautions Precautions: Fall Restrictions Weight Bearing Restrictions: No      Mobility Bed Mobility               General bed mobility comments: up in chair  Transfers Overall transfer level: Needs assistance Equipment used: Rolling walker (2 wheeled) Transfers: Sit to/from Stand Sit to Stand: Min guard         General transfer comment: to/from recliner and 3n1 over toilet, min guard for safety    Balance Overall balance assessment: Needs assistance Sitting-balance support: Feet supported Sitting balance-Leahy Scale: Fair     Standing balance support: Bilateral upper extremity supported Standing balance-Leahy  Scale: Poor Standing balance comment: rw for support                           ADL either performed or assessed with clinical judgement   ADL Overall ADL's : Needs assistance/impaired Eating/Feeding: Set up;Sitting   Grooming: Min guard;Standing   Upper Body Bathing: Set up;Sitting   Lower Body Bathing: Min guard;Sit to/from stand   Upper Body Dressing : Set up;Sitting   Lower Body Dressing: Min guard;Sit to/from stand   Toilet Transfer: Min guard;Ambulation;RW Toilet Transfer Details (indicate cue type and reason): 3n1 over toilet Toileting- Clothing Manipulation and Hygiene: Set up;Min guard;Sitting/lateral lean;Sit to/from stand   Tub/ Shower Transfer: Tub transfer;Ambulation;Tub bench;Rolling walker;Min guard Tub/Shower Transfer Details (indicate cue type and reason): clinical judgement, based on home setup Functional mobility during ADLs: Min guard;Rolling walker General ADL Comments: Pt completed mobility to/from bathroom, toilet transfer, pericare, and grooming as detailed above.      Vision Baseline Vision/History: Wears glasses       Perception     Praxis      Pertinent Vitals/Pain Pain Assessment: Faces Faces Pain Scale: Hurts a little bit Pain Intervention(s): Monitored during session     Hand Dominance     Extremity/Trunk Assessment Upper Extremity Assessment Upper Extremity Assessment: Overall WFL for tasks assessed   Lower Extremity Assessment Lower Extremity Assessment: Defer to PT evaluation       Communication Communication Communication: No difficulties   Cognition Arousal/Alertness: Awake/alert Behavior During Therapy: WFL for tasks assessed/performed Overall Cognitive Status:  Within Functional Limits for tasks assessed                                     General Comments  pt reporting lightheadedness once sitting upright and while ambulating. Pt reports she suspects med related and that this happens at home  as well. VSS.    Exercises     Shoulder Instructions      Home Living Family/patient expects to be discharged to:: Private residence Living Arrangements: Spouse/significant other;Children Available Help at Discharge: Family;Available PRN/intermittently Type of Home: House Home Access: Ramped entrance     Home Layout: Two level;Able to live on main level with bedroom/bathroom Alternate Level Stairs-Number of Steps: 5   Bathroom Shower/Tub: Teacher, early years/pre: Standard     Home Equipment: Environmental consultant - 2 wheels;Tub bench;Wheelchair - manual;Bedside commode          Prior Functioning/Environment Level of Independence: Independent with assistive device(s)        Comments: amb with RW at times        OT Problem List: Decreased strength;Decreased activity tolerance;Impaired balance (sitting and/or standing);Decreased knowledge of use of DME or AE;Decreased knowledge of precautions      OT Treatment/Interventions: Self-care/ADL training;Therapeutic exercise;Energy conservation;DME and/or AE instruction;Therapeutic activities;Patient/family education;Balance training    OT Goals(Current goals can be found in the care plan section) Acute Rehab OT Goals Patient Stated Goal: home and continue rehab OT Goal Formulation: With patient Time For Goal Achievement: 05/06/19 Potential to Achieve Goals: Good ADL Goals Pt Will Perform Grooming: with set-up;with supervision;standing Pt Will Perform Upper Body Dressing: with set-up;sitting;with modified independence Pt Will Perform Lower Body Dressing: with modified independence;sit to/from stand Pt Will Perform Tub/Shower Transfer: with supervision;ambulating;tub bench;rolling walker  OT Frequency: Min 2X/week   Barriers to D/C:            Co-evaluation              AM-PAC OT "6 Clicks" Daily Activity     Outcome Measure Help from another person eating meals?: None Help from another person taking care of  personal grooming?: None Help from another person toileting, which includes using toliet, bedpan, or urinal?: None Help from another person bathing (including washing, rinsing, drying)?: A Little Help from another person to put on and taking off regular upper body clothing?: None Help from another person to put on and taking off regular lower body clothing?: None 6 Click Score: 23   End of Session Equipment Utilized During Treatment: Rolling walker  Activity Tolerance: Patient limited by fatigue;Patient tolerated treatment well;Other (comment)(lightheaded) Patient left: in chair;with call bell/phone within reach  OT Visit Diagnosis: Unsteadiness on feet (R26.81);Pain                Time: 3491-7915 OT Time Calculation (min): 22 min Charges:  OT General Charges $OT Visit: 1 Visit OT Evaluation $OT Eval Low Complexity: [place], OT Acute Rehabilitation Services Pager: [phone] Office: [phone]  Hortencia Pilar 04/22/2019, 2:29 PM

## 2019-04-22 NOTE — Progress Notes (Signed)
Pharmacy Antibiotic Note  April [name] is a 63 y.o. female with hx renal transplant, presented to t he ED on 04/19/2019 with c/o headache and emesis.  To start cefepime for sepsis.  Today, 04/22/19  Day 3 Cefepime  WBC WNL improved  Afebrile   SCr pretty stable - CrCl 46  Cultures negative or insignificant growth  Plan: Cefepime 2g IV q12 appropriate per renal function but would recommend to discontinue abx's at this time  __________________________________  Height: 5\' 2"  (157.5 cm) Weight: 177 lb 11.1 oz (80.6 kg) IBW/kg (Calculated) : 50.1  Temp (24hrs), Avg:98.2 F (36.8 C), Min:97.8 F (36.6 C), Max:98.3 F (36.8 C)  Recent Labs  Lab 04/19/19 1243 04/19/19 1244 04/20/19 0250 04/21/19 0233 04/22/19 0505  WBC  --  18.1* 16.8* 10.3 5.9  CREATININE 1.05*  --  1.22* 1.30* 1.26*  LATICACIDVEN  --  1.0  --   --   --     Estimated Creatinine Clearance: 46.1 mL/min (A) (by C-G formula based on SCr of 1.26 mg/dL (H)).    Allergies  Allergen Reactions  . Infed [Iron Dextran] Other (See Comments)    Chest tightness  . Pentamidine Itching, Shortness Of Breath and Swelling  . Erythromycin [Erythromycin] Other (See Comments)    Mouth Ulcers  . Oxycodone Nausea Only and Nausea And Vomiting  . Erythromycin Rash    Causes breakout in mouth  . Ultram [Tramadol Hcl] Anxiety      Thank you for allowing pharmacy to be a part of this patient's care.  Kara Mead 04/22/2019 11:13 AM

## 2019-04-23 LAB — COMPREHENSIVE METABOLIC PANEL
ALT: 13 U/L (ref 0–44)
AST: 12 U/L — ABNORMAL LOW (ref 15–41)
Albumin: 2.5 g/dL — ABNORMAL LOW (ref 3.5–5.0)
Alkaline Phosphatase: 77 U/L (ref 38–126)
Anion gap: 8 (ref 5–15)
BUN: 51 mg/dL — ABNORMAL HIGH (ref 8–23)
CO2: 21 mmol/L — ABNORMAL LOW (ref 22–32)
Calcium: 9.2 mg/dL (ref 8.9–10.3)
Chloride: 104 mmol/L (ref 98–111)
Creatinine, Ser: 1.15 mg/dL — ABNORMAL HIGH (ref 0.44–1.00)
GFR calc Af Amer: 59 mL/min — ABNORMAL LOW (ref >60)
GFR calc non Af Amer: 51 mL/min — ABNORMAL LOW (ref >60)
Glucose, Bld: 177 mg/dL — ABNORMAL HIGH (ref 70–99)
Potassium: 4.5 mmol/L (ref 3.5–5.1)
Sodium: 133 mmol/L — ABNORMAL LOW (ref 135–145)
Total Bilirubin: 0.5 mg/dL (ref 0.3–1.2)
Total Protein: 6.3 g/dL — ABNORMAL LOW (ref 6.5–8.1)

## 2019-04-23 LAB — GLUCOSE, CAPILLARY: Glucose-Capillary: 101 mg/dL — ABNORMAL HIGH (ref 70–99)

## 2019-04-23 NOTE — Progress Notes (Signed)
AVS given to patient and explained at the bedside. Medications and follow up appointments have been explained with pt verbalizing understanding.  

## 2019-04-23 NOTE — Discharge Summary (Signed)
Physician Discharge Summary  April [name] VPX:[id number] DOB: Mar 30, 1956 DOA: 04/19/2019  PCP: [name], MD  Admit date: 04/19/2019 Discharge date: 04/23/2019  Admitted From: Home. Disposition: Home  Recommendations for Outpatient Follow-up:  1. Follow up with PCP in 1-2 weeks 2. Please follow-up with your transplant team as well as nephrology as outpatient.  Home Health: Not applicable Equipment/Devices: Not applicable  Discharge Condition: Stable CODE STATUS: Full code Diet recommendation: Cardiac diet  Brief/Interim Summary: 63 year old female with history of asthma, GERD, ESRD status post renal transplant and chronic diastolic heart failure who was admitted to the hospital with 3 days of shortness of breath and nausea and vomiting.  Also reported temperature of 101.7 at home.  Patient had low-grade temperature in the ER.  Was tachycardic and tachypneic.  BNP was 1000.  WBC count 18,000.  CT head was normal.  Chest x-ray showed pulmonary vascular congestion.'s COVID-19 negative.  Initially given IV Lasix with diagnosis of diastolic heart failure.  Discharge Diagnoses:  Active Problems:   Asthma, mild intermittent   GERD (gastroesophageal reflux disease)   Renal transplant recipient   Acute respiratory failure with hypoxia (HCC)   Acute on chronic diastolic CHF (congestive heart failure) (HCC)  Acute respiratory failure with hypoxia: Initially suspected with acute on chronic diastolic heart failure.  However patient was clinically dehydrated and was treated with IV fluids.  Echocardiogram showed normal ejection fraction and diastolic dysfunction.  She was treated symptomatically with breathing exercises and bronchodilator therapy and improved symptomatically and currently on room air.  She was also treated with antibiotics, however did not show any evidence of infection on culture data.  Patient responded well to increased dose of prednisone from her home doses.  Currently  all symptoms improved, on room air.  History of kidney transplant for ESRD: Renal functions are stable.  On prednisone and Prograf.  Stable.  She will follow-up with her nephrologist as outpatient.  Abnormal bilirubin: Patient had bilirubin of 3 with normal transaminases.  Cause unknown.  However improved with hydration and normalized.  Patient is currently medically stable.  She is going home with outpatient follow-up.   Discharge Instructions  Discharge Instructions    Diet - low sodium heart healthy   Complete by:  As directed    Discharge instructions   Complete by:  As directed    Call and follow up with your Kidney doctor   Increase activity slowly   Complete by:  As directed      Allergies as of 04/23/2019      Reactions   Infed [iron Dextran] Other (See Comments)   Chest tightness   Pentamidine Itching, Shortness Of Breath, Swelling   Erythromycin [erythromycin] Other (See Comments)   Mouth Ulcers   Oxycodone Nausea Only, Nausea And Vomiting   Erythromycin Rash   Causes breakout in mouth   Ultram [tramadol Hcl] Anxiety      Medication List    STOP taking these medications   enoxaparin 40 MG/0.4ML injection Commonly known as:  LOVENOX     TAKE these medications   acetaminophen 500 MG tablet Commonly known as:  TYLENOL Take 1,000 mg by mouth every 6 (six) hours as needed for moderate pain.   acyclovir 400 MG tablet Commonly known as:  ZOVIRAX Take 400 mg by mouth 2 (two) times daily as needed (outbreaks).   albuterol 108 (90 Base) MCG/ACT inhaler Commonly known as:  VENTOLIN HFA Inhale 2 puffs into the lungs every 4 (four) hours as needed  for wheezing or shortness of breath.   allopurinol 300 MG tablet Commonly known as:  ZYLOPRIM Take 300 mg by mouth daily.   aspirin EC 81 MG tablet Take 81 mg by mouth daily.   brimonidine 0.1 % Soln Commonly known as:  ALPHAGAN P Place 1 drop 2 (two) times daily into the left eye.   cholecalciferol 25 MCG (1000  UT) tablet Commonly known as:  VITAMIN D3 Take 1,000 Units by mouth every other day.   Colchicine 0.6 MG Caps Take 0.6 mg by mouth daily as needed (gout).   cycloSPORINE 0.05 % ophthalmic emulsion Commonly known as:  RESTASIS Place 1 drop into both eyes 2 (two) times daily.   FLUoxetine 40 MG capsule Commonly known as:  PROZAC Take 40 mg by mouth daily.   furosemide 40 MG tablet Commonly known as:  LASIX Take 1 tablet by mouth daily.   HYDROcodone-acetaminophen 5-325 MG tablet Commonly known as:  NORCO/VICODIN Take 1-2 tablets by mouth every 4 (four) hours as needed for moderate pain.   ipratropium-albuterol 0.5-2.5 (3) MG/3ML Soln Commonly known as:  DUONEB Take 3 mLs by nebulization every 6 (six) hours as needed (shortness of breath).   latanoprost 0.005 % ophthalmic solution Commonly known as:  XALATAN Place 1 drop into both eyes at bedtime.   magnesium oxide 400 MG tablet Commonly known as:  MAG-OX Take 400 mg by mouth 2 (two) times daily.   mycophenolate 180 MG EC tablet Commonly known as:  MYFORTIC Take 180 mg by mouth 2 (two) times daily.   ondansetron 4 MG disintegrating tablet Commonly known as:  Zofran ODT 4mg  ODT q4 hours prn nausea/vomit What changed:    how much to take  how to take this  when to take this  reasons to take this  additional instructions   pantoprazole 40 MG tablet Commonly known as:  PROTONIX Take 40 mg by mouth daily.   prednisoLONE acetate 1 % ophthalmic suspension Commonly known as:  PRED FORTE Place 1 drop into both eyes See admin instructions. Instill 1 drop left eye four times daily and 1 drop right eye once daily.   predniSONE 5 MG tablet Commonly known as:  DELTASONE Take 7.5 mg by mouth daily.   Symbicort 80-4.5 MCG/ACT inhaler Generic drug:  budesonide-formoterol Inhale 2 puffs into the lungs 2 (two) times a day.   tacrolimus 0.5 MG capsule Commonly known as:  PROGRAF Take 0.5 mg by mouth at bedtime. Takes  brand name Prograf   Prograf 1 MG capsule Generic drug:  tacrolimus Take 1 mg by mouth daily.   Travatan Z 0.004 % Soln ophthalmic solution Generic drug:  Travoprost (BAK Free) Place 1 drop into both eyes at bedtime.   vitamin B-12 100 MCG tablet Commonly known as:  CYANOCOBALAMIN Take 100 mcg by mouth daily.       Allergies  Allergen Reactions  . Infed [Iron Dextran] Other (See Comments)    Chest tightness  . Pentamidine Itching, Shortness Of Breath and Swelling  . Erythromycin [Erythromycin] Other (See Comments)    Mouth Ulcers  . Oxycodone Nausea Only and Nausea And Vomiting  . Erythromycin Rash    Causes breakout in mouth  . Ultram [Tramadol Hcl] Anxiety    Consultations:  Nephrology   Procedures/Studies: Ct Head Wo Contrast  Result Date: 04/19/2019 CLINICAL DATA:  Headache EXAM: CT HEAD WITHOUT CONTRAST TECHNIQUE: Contiguous axial images were obtained from the base of the skull through the vertex without intravenous contrast. COMPARISON:  CT head dated 12/27/2018 FINDINGS: Brain: No evidence of acute infarction, hemorrhage, hydrocephalus, extra-axial collection or mass lesion/mass effect. Vascular: No hyperdense vessel or unexpected calcification. Skull: Normal. Negative for fracture or focal lesion. Sinuses/Orbits: No acute finding. The patient is status post bilateral cataract surgery. Other: None. IMPRESSION: No acute intracranial abnormality detected. Electronically Signed   By: Constance Holster M.D.   On: 04/19/2019 15:17   Dg Chest Port 1 View  Result Date: 04/19/2019 CLINICAL DATA:  Shortness of breath EXAM: PORTABLE CHEST 1 VIEW COMPARISON:  04/19/2019 FINDINGS: Cardiac shadow is stable. Vascular congestion has improved slightly in the interval from the prior exam. Mild bibasilar atelectasis is now noted. No bony abnormality is seen. IMPRESSION: Improved vascular congestion when compare with the prior exam. Mild bibasilar atelectasis is noted. Electronically  Signed   By: Inez Catalina M.D.   On: 04/19/2019 22:16   Dg Chest Port 1 View  Result Date: 03/31/2019 CLINICAL DATA:  Fever and cough EXAM: PORTABLE CHEST 1 VIEW COMPARISON:  Chest x-ray dated 12/25/2018 FINDINGS: The cardiac silhouette is enlarged. There is no pneumothorax. No significant pleural effusion. Bibasilar scarring versus atelectasis is again noted. No acute osseous abnormality. IMPRESSION: No active disease. Electronically Signed   By: Constance Holster M.D.   On: 03/31/2019 17:19   Dg Abd Acute W/chest  Result Date: 04/19/2019 CLINICAL DATA:  Cough, fever, shortness of breath, and vomiting. EXAM: DG ABDOMEN ACUTE W/ 1V CHEST COMPARISON:  CT abdomen pelvis dated Mar 31, 2019. Chest x-ray dated Mar 31, 2019. FINDINGS: The cardiomediastinal silhouette is normal in size. New mild pulmonary vascular congestion. No focal consolidation, pleural effusion, or pneumothorax. Unchanged linear bibasilar atelectasis/scarring. No acute osseous abnormality. There is no evidence of dilated bowel loops or free intraperitoneal air. No radiopaque calculi or other significant radiographic abnormality is seen. Unchanged splenomegaly. IMPRESSION: 1. No acute abnormality in the abdomen. 2. Unchanged splenomegaly. 3. New mild pulmonary vascular congestion. Electronically Signed   By: Titus Dubin M.D.   On: 04/19/2019 12:44   Ct Renal Stone Study  Result Date: 03/31/2019 CLINICAL DATA:  Nausea and flank pain EXAM: CT ABDOMEN AND PELVIS WITHOUT CONTRAST TECHNIQUE: Multidetector CT imaging of the abdomen and pelvis was performed following the standard protocol without IV contrast. COMPARISON:  None. FINDINGS: Lower chest: Mild atelectatic changes are noted in the bases bilaterally. Hepatobiliary: Scattered cysts are noted throughout the liver. The gallbladder is within normal limits. Pancreas: Pancreas is within normal limits. Spleen: Spleen is unremarkable. Adrenals/Urinary Tract: Adrenal glands are within normal  limits. The native kidneys demonstrate multiple cysts consistent with polycystic kidney disease. No obstructive changes are seen. No obstructive changes are noted in the native kidneys. Bladder is well distended. A transplant kidney is noted in the right lower quadrant without obstructive change or renal calculi. No inflammatory changes are seen. Stomach/Bowel: Scattered diverticular changes noted throughout the colon. No obstructive or inflammatory changes are seen. The appendix is within normal limits. Small bowel and stomach are unremarkable. Vascular/Lymphatic: Aortic atherosclerosis. No enlarged abdominal or pelvic lymph nodes. Reproductive: Uterus and bilateral adnexa are unremarkable. Other: Small periumbilical fat containing hernia is noted. Musculoskeletal: Mild degenerative changes of lumbar spine are noted. IMPRESSION: Renal transplant kidney in the right lower quadrant without obstructive change. Changes consistent with polycystic renal disease and multiple hepatic cysts. No acute obstructive changes are seen. Diverticulosis without diverticulitis. Normal-appearing appendix. Periumbilical fat containing hernia. No bowel loops are noted within. Electronically Signed   By: Linus Mako.D.  On: 03/31/2019 20:14    Echocardiogram was normal   Subjective: Patient was seen and examined on the day of discharge.  Denied any complaints.  No fever.  No shortness of breath.  On room air.   Discharge Exam: Vitals:   04/23/19 0743 04/23/19 0746  BP:    Pulse:    Resp:    Temp:    SpO2: 97% 97%   Vitals:   04/22/19 2143 04/23/19 0552 04/23/19 0743 04/23/19 0746  BP: 123/65 121/63    Pulse: 69 78    Resp: 18 16    Temp: 97.9 F (36.6 C) 98 F (36.7 C)    TempSrc:  Oral    SpO2: 98% 97% 97% 97%  Weight:  79.9 kg    Height:        General: Pt is alert, awake, not in acute distress Cardiovascular: RRR, S1/S2 +, no rubs, no gallops Respiratory: CTA bilaterally, no wheezing, no  rhonchi Abdominal: Soft, NT, ND, bowel sounds + Extremities: no edema, no cyanosis    The results of significant diagnostics from this hospitalization (including imaging, microbiology, ancillary and laboratory) are listed below for reference.     Microbiology: Recent Results (from the past 240 hour(s))  SARS Coronavirus 2 (CEPHEID- Performed in [place] hospital lab), Hosp Order     Status: None   Collection Time: 04/19/19 12:14 PM  Result Value Ref Range Status   SARS Coronavirus 2 NEGATIVE NEGATIVE Final    Comment: (NOTE) If result is NEGATIVE SARS-CoV-2 target nucleic acids are NOT DETECTED. The SARS-CoV-2 RNA is generally detectable in upper and lower  respiratory specimens during the acute phase of infection. The lowest  concentration of SARS-CoV-2 viral copies this assay can detect is 250  copies / mL. A negative result does not preclude SARS-CoV-2 infection  and should not be used as the sole basis for treatment or other  patient management decisions.  A negative result may occur with  improper specimen collection / handling, submission of specimen other  than nasopharyngeal swab, presence of viral mutation(s) within the  areas targeted by this assay, and inadequate number of viral copies  (<250 copies / mL). A negative result must be combined with clinical  observations, patient history, and epidemiological information. If result is POSITIVE SARS-CoV-2 target nucleic acids are DETECTED. The SARS-CoV-2 RNA is generally detectable in upper and lower  respiratory specimens dur ing the acute phase of infection.  Positive  results are indicative of active infection with SARS-CoV-2.  Clinical  correlation with patient history and other diagnostic information is  necessary to determine patient infection status.  Positive results do  not rule out bacterial infection or co-infection with other viruses. If result is PRESUMPTIVE POSTIVE SARS-CoV-2 nucleic acids MAY BE PRESENT.    A presumptive positive result was obtained on the submitted specimen  and confirmed on repeat testing.  While 2019 novel coronavirus  (SARS-CoV-2) nucleic acids may be present in the submitted sample  additional confirmatory testing may be necessary for epidemiological  and / or clinical management purposes  to differentiate between  SARS-CoV-2 and other Sarbecovirus currently known to infect humans.  If clinically indicated additional testing with an alternate test  methodology [phone]) is advised. The SARS-CoV-2 RNA is generally  detectable in upper and lower respiratory sp ecimens during the acute  phase of infection. The expected result is Negative. Fact Sheet for Patients:  StrictlyIdeas.no Fact Sheet for Healthcare Providers: BankingDealers.co.za This test is not yet approved or  cleared by the [place] and has been authorized for detection and/or diagnosis of SARS-CoV-2 by FDA under an Emergency Use Authorization (EUA).  This EUA will remain in effect (meaning this test can be used) for the duration of the COVID-19 declaration under Section 564(b)(1) of the Act, 21 U.S.C. section 360bbb-3(b)(1), unless the authorization is terminated or revoked sooner. Performed at [facility], [place] [address], [place], [place] [address]   Culture, blood (routine x 2)     Status: None (Preliminary result)   Collection Time: 04/19/19 12:43 PM  Result Value Ref Range Status   Specimen Description   Final    BLOOD LEFT ANTECUBITAL Performed at [facility] [address], [place], [place] [address]    Special Requests   Final    BOTTLES DRAWN AEROBIC AND ANAEROBIC Blood Culture adequate volume Performed at [place] [address] [place], [place] [address]    Culture   Final    NO GROWTH 3 DAYS Performed at [facility], [place] [address], [place], [place] [address]     Report Status PENDING  Incomplete  Culture, blood (routine x 2)     Status: None (Preliminary result)   Collection Time: 04/19/19  1:00 PM  Result Value Ref Range Status   Specimen Description   Final    BLOOD RIGHT ANTECUBITAL Performed at [facility] [address], [place], [place] [address]    Special Requests   Final    BOTTLES DRAWN AEROBIC AND ANAEROBIC Blood Culture adequate volume Performed at [facility] [address] [place], [place] [address]    Culture   Final    NO GROWTH 3 DAYS Performed at [facility], [place] [address] [place], [place] [address]    Report Status PENDING  Incomplete  Urine culture     Status: Abnormal   Collection Time: 04/19/19  5:03 PM  Result Value Ref Range Status   Specimen Description   Final    URINE, CLEAN CATCH Performed at [facility], [place] [address], [place], [place] [address]    Special Requests   Final    Immunocompromised Performed at [facility], [place] [address], [place], [place] [address]    Culture (A)  Final    <10,000 COLONIES/mL INSIGNIFICANT GROWTH Performed at [place] [facility] [address] [place], [place] [address]    Report Status 04/20/2019 FINAL  Final  MRSA PCR Screening     Status: None   Collection Time: 04/19/19  5:25 PM  Result Value Ref Range Status   MRSA by PCR NEGATIVE NEGATIVE Final    Comment:        The GeneXpert MRSA Assay (FDA approved for NASAL specimens only), is one component of a comprehensive MRSA colonization surveillance program. It is not intended to diagnose MRSA infection nor to guide or monitor treatment for MRSA infections. Performed at [facility], [place] [address], [place], [place] [address]   Novel Coronavirus, NAA (hospital order; send-out to ref lab)     Status: None   Collection Time: 04/19/19  5:40 PM  Result Value Ref Range Status   SARS-CoV-2, NAA NOT DETECTED  NOT DETECTED Final    Comment: (NOTE) This test was developed and its performance characteristics determined by [employer]. This test has not been FDA cleared or approved. This test has been authorized by FDA under an Emergency  Use Authorization (EUA). This test is only authorized for the duration of time the declaration that circumstances exist justifying the authorization of the emergency use of in vitro diagnostic tests for detection of SARS-CoV-2 virus and/or diagnosis of COVID-19 infection under section 564(b)(1) of the Act, 21 U.S.C. 568SHU-8(H)(7), unless the authorization is terminated or revoked sooner. When diagnostic testing is negative, the possibility of a false negative result should be considered in the context of a patient's recent exposures and the presence of clinical signs and symptoms consistent with COVID-19. An individual without symptoms of COVID-19 and who is not shedding SARS-CoV-2 virus would expect to have a negative (not detected) result in this assay. Performed  At: [facility] [place], [place] [id number] Rush Farmer MD [id number]    [place]  Final    Comment: Performed at [place] [address] [place], [place] [address]     Labs: BNP (last 3 results) Recent Labs    10/02/18 1930 04/19/19 1244  BNP 309.1* 3,005.1*   Basic Metabolic Panel: Recent Labs  Lab 04/19/19 1243 04/20/19 0250 04/21/19 0233 04/22/19 0505 04/23/19 0353  NA 131* 130* 131* 129* 133*  K 4.2 4.0 4.0 4.7 4.5  CL 97* 97* 98 99 104  CO2 24 21* 23 21* 21*  GLUCOSE 111* 98 103* 161* 177*  BUN 25* 28* 36* 44* 51*  CREATININE 1.05* 1.22* 1.30* 1.26* 1.15*  CALCIUM 9.5 9.2 8.9 9.0 9.2   Liver Function Tests: Recent Labs  Lab 04/19/19 1243 04/20/19 0250 04/23/19 0353  AST 22 19 12*  ALT 21 18 13   ALKPHOS 114 102 77  BILITOT 3.9* 3.6* 0.5  PROT 7.6 7.0 6.3*  ALBUMIN 3.2* 2.8*  2.5*   Recent Labs  Lab 04/19/19 1243  LIPASE 22   No results for input(s): AMMONIA in the last 168 hours. CBC: Recent Labs  Lab 04/19/19 1244 04/20/19 0250 04/21/19 0233 04/22/19 0505  WBC 18.1* 16.8* 10.3 5.9  NEUTROABS 15.6* 14.1* 8.7* 5.3  HGB 10.8* 10.5* 9.2* 8.7*  HCT 36.0 35.8* 32.1* 30.0*  MCV 94.5 92.5 95.0 95.8  PLT 178 174 148* 123*   Cardiac Enzymes: Recent Labs  Lab 04/19/19 1244  TROPONINI 0.03*   BNP: Invalid input(s): POCBNP CBG: Recent Labs  Lab 04/23/19 0730  GLUCAP 101*   D-Dimer No results for input(s): DDIMER in the last 72 hours. Hgb A1c No results for input(s): HGBA1C in the last 72 hours. Lipid Profile No results for input(s): CHOL, HDL, LDLCALC, TRIG, CHOLHDL, LDLDIRECT in the last 72 hours. Thyroid function studies No results for input(s): TSH, T4TOTAL, T3FREE, THYROIDAB in the last 72 hours.  Invalid input(s): FREET3 Anemia work up No results for input(s): VITAMINB12, FOLATE, FERRITIN, TIBC, IRON, RETICCTPCT in the last 72 hours. Urinalysis    Component Value Date/Time   COLORURINE YELLOW 04/19/2019 1703   APPEARANCEUR CLEAR 04/19/2019 1703   LABSPEC 1.009 04/19/2019 1703   PHURINE 5.0 04/19/2019 1703   GLUCOSEU NEGATIVE 04/19/2019 1703   HGBUR MODERATE (A) 04/19/2019 1703   BILIRUBINUR NEGATIVE 04/19/2019 1703   KETONESUR NEGATIVE 04/19/2019 1703   PROTEINUR 100 (A) 04/19/2019 1703   UROBILINOGEN 0.2 05/15/2013 0733   NITRITE NEGATIVE 04/19/2019 1703   LEUKOCYTESUR MODERATE (A) 04/19/2019 1703   Sepsis Labs Invalid input(s): PROCALCITONIN,  WBC,  LACTICIDVEN Microbiology Recent Results (from the past 240 hour(s))  SARS Coronavirus 2 (CEPHEID- Performed in [place] hospital lab), Hosp Order     Status:  None   Collection Time: 04/19/19 12:14 PM  Result Value Ref Range Status   SARS Coronavirus 2 NEGATIVE NEGATIVE Final    Comment: (NOTE) If result is NEGATIVE SARS-CoV-2 target nucleic acids are NOT DETECTED. The  SARS-CoV-2 RNA is generally detectable in upper and lower  respiratory specimens during the acute phase of infection. The lowest  concentration of SARS-CoV-2 viral copies this assay can detect is 250  copies / mL. A negative result does not preclude SARS-CoV-2 infection  and should not be used as the sole basis for treatment or other  patient management decisions.  A negative result may occur with  improper specimen collection / handling, submission of specimen other  than nasopharyngeal swab, presence of viral mutation(s) within the  areas targeted by this assay, and inadequate number of viral copies  (<250 copies / mL). A negative result must be combined with clinical  observations, patient history, and epidemiological information. If result is POSITIVE SARS-CoV-2 target nucleic acids are DETECTED. The SARS-CoV-2 RNA is generally detectable in upper and lower  respiratory specimens dur ing the acute phase of infection.  Positive  results are indicative of active infection with SARS-CoV-2.  Clinical  correlation with patient history and other diagnostic information is  necessary to determine patient infection status.  Positive results do  not rule out bacterial infection or co-infection with other viruses. If result is PRESUMPTIVE POSTIVE SARS-CoV-2 nucleic acids MAY BE PRESENT.   A presumptive positive result was obtained on the submitted specimen  and confirmed on repeat testing.  While 2019 novel coronavirus  (SARS-CoV-2) nucleic acids may be present in the submitted sample  additional confirmatory testing may be necessary for epidemiological  and / or clinical management purposes  to differentiate between  SARS-CoV-2 and other Sarbecovirus currently known to infect humans.  If clinically indicated additional testing with an alternate test  methodology [phone]) is advised. The SARS-CoV-2 RNA is generally  detectable in upper and lower respiratory sp ecimens during the acute   phase of infection. The expected result is Negative. Fact Sheet for Patients:  StrictlyIdeas.no Fact Sheet for Healthcare Providers: BankingDealers.co.za This test is not yet approved or cleared by the [place] FDA and has been authorized for detection and/or diagnosis of SARS-CoV-2 by FDA under an Emergency Use Authorization (EUA).  This EUA will remain in effect (meaning this test can be used) for the duration of the COVID-19 declaration under Section 564(b)(1) of the Act, 21 U.S.C. section 360bbb-3(b)(1), unless the authorization is terminated or revoked sooner. Performed at [facility], [place] [address], [place], [place] [address]   Culture, blood (routine x 2)     Status: None (Preliminary result)   Collection Time: 04/19/19 12:43 PM  Result Value Ref Range Status   Specimen Description   Final    BLOOD LEFT ANTECUBITAL Performed at [facility] [address] [place], [place] [address]    Special Requests   Final    BOTTLES DRAWN AEROBIC AND ANAEROBIC Blood Culture adequate volume Performed at [place] [address] [place], [place] [address]    Culture   Final    NO GROWTH 3 DAYS Performed at [facility], [place] [address] [place], [place] [address]    Report Status PENDING  Incomplete  Culture, blood (routine x 2)     Status: None (Preliminary result)   Collection Time: 04/19/19  1:00 PM  Result Value Ref Range Status   Specimen  Description   Final    BLOOD RIGHT ANTECUBITAL Performed at [place] [address] [place], [place] [address]    Special Requests   Final    BOTTLES DRAWN AEROBIC AND ANAEROBIC Blood Culture adequate volume Performed at [place] [address] [place], [place] [address]    Culture   Final    NO GROWTH 3 DAYS Performed at [facility], [place] [address]  [place], [place] [address]    Report Status PENDING  Incomplete  Urine culture     Status: Abnormal   Collection Time: 04/19/19  5:03 PM  Result Value Ref Range Status   Specimen Description   Final    URINE, CLEAN CATCH Performed at [facility], [place] [address], [place], [place] [address]    Special Requests   Final    Immunocompromised Performed at [facility], [place] [address] [place], [place] [address]    Culture (A)  Final    <10,000 COLONIES/mL INSIGNIFICANT GROWTH Performed at [place] [address], [place], [place] [address]    Report Status 04/20/2019 FINAL  Final  MRSA PCR Screening     Status: None   Collection Time: 04/19/19  5:25 PM  Result Value Ref Range Status   MRSA by PCR NEGATIVE NEGATIVE Final    Comment:        The GeneXpert MRSA Assay (FDA approved for NASAL specimens only), is one component of a comprehensive MRSA colonization surveillance program. It is not intended to diagnose MRSA infection nor to guide or monitor treatment for MRSA infections. Performed at [facility], [place] [address], [place], [place] [address]   Novel Coronavirus, NAA (hospital order; send-out to ref lab)     Status: None   Collection Time: 04/19/19  5:40 PM  Result Value Ref Range Status   SARS-CoV-2, NAA NOT DETECTED NOT DETECTED Final    Comment: (NOTE) This test was developed and its performance characteristics determined by [employer]. This test has not been FDA cleared or approved. This test has been authorized by FDA under an Emergency Use Authorization (EUA). This test is only authorized for the duration of time the declaration that circumstances exist justifying the authorization of the emergency use of in vitro diagnostic tests for detection of SARS-CoV-2 virus and/or diagnosis of COVID-19 infection under section 564(b)(1) of the Act, 21 U.S.C. 700FVC-9(S)(4), unless the authorization is  terminated or revoked sooner. When diagnostic testing is negative, the possibility of a false negative result should be considered in the context of a patient's recent exposures and the presence of clinical signs and symptoms consistent with COVID-19. An individual without symptoms of COVID-19 and who is not shedding SARS-CoV-2 virus would expect to have a negative (not detected) result in this assay. Performed  At: [facility] [place], [place] [id number] Rush Farmer MD GY:[id number]    [place]  Final    Comment: Performed at [place] [address] [place],  [address]     Time coordinating discharge: 25 minutes  SIGNED:   Barb Merino, MD  [facility] 04/23/2019, 11:04 AM Pager [phone]  If 7PM-7AM, please contact night-coverage [email or web address] Password [email or web address]

## 2019-04-24 LAB — CULTURE, BLOOD (ROUTINE X 2)
Culture: NO GROWTH
Culture: NO GROWTH
Special Requests: ADEQUATE
Special Requests: ADEQUATE

## 2019-05-11 ENCOUNTER — Other Ambulatory Visit: Payer: Self-pay | Admitting: Orthopedic Surgery

## 2019-05-11 DIAGNOSIS — M810 Age-related osteoporosis without current pathological fracture: Secondary | ICD-10-CM

## 2019-05-17 ENCOUNTER — Other Ambulatory Visit: Payer: Self-pay | Admitting: Orthopedic Surgery

## 2019-05-17 ENCOUNTER — Other Ambulatory Visit: Payer: Self-pay | Admitting: Family Medicine

## 2019-05-17 DIAGNOSIS — Z13820 Encounter for screening for osteoporosis: Secondary | ICD-10-CM

## 2019-05-18 ENCOUNTER — Other Ambulatory Visit: Payer: Self-pay

## 2019-05-18 ENCOUNTER — Ambulatory Visit
Admission: RE | Admit: 2019-05-18 | Discharge: 2019-05-18 | Disposition: A | Discharge status: Home / Self Care | Payer: Medicare Other | Source: Ambulatory Visit | Attending: Orthopedic Surgery | Admitting: Orthopedic Surgery

## 2019-05-18 DIAGNOSIS — Z13820 Encounter for screening for osteoporosis: Secondary | ICD-10-CM

## 2019-06-29 ENCOUNTER — Encounter (HOSPITAL_COMMUNITY): Payer: Self-pay | Admitting: Emergency Medicine

## 2019-06-29 ENCOUNTER — Emergency Department (HOSPITAL_COMMUNITY): Payer: Medicare Other

## 2019-06-29 ENCOUNTER — Other Ambulatory Visit: Payer: Self-pay

## 2019-06-29 ENCOUNTER — Inpatient Hospital Stay (HOSPITAL_COMMUNITY)
Admission: EM | Admit: 2019-06-29 | Discharge: 2019-07-02 | DRG: 189 | Disposition: A | Discharge status: Home Health | Payer: Medicare Other | Attending: Internal Medicine | Admitting: Internal Medicine

## 2019-06-29 DIAGNOSIS — N390 Urinary tract infection, site not specified: Secondary | ICD-10-CM | POA: Diagnosis present

## 2019-06-29 DIAGNOSIS — Z8271 Family history of polycystic kidney: Secondary | ICD-10-CM

## 2019-06-29 DIAGNOSIS — I132 Hypertensive heart and chronic kidney disease with heart failure and with stage 5 chronic kidney disease, or end stage renal disease: Secondary | ICD-10-CM | POA: Diagnosis present

## 2019-06-29 DIAGNOSIS — E782 Mixed hyperlipidemia: Secondary | ICD-10-CM | POA: Diagnosis present

## 2019-06-29 DIAGNOSIS — Z8249 Family history of ischemic heart disease and other diseases of the circulatory system: Secondary | ICD-10-CM | POA: Diagnosis not present

## 2019-06-29 DIAGNOSIS — Q613 Polycystic kidney, unspecified: Secondary | ICD-10-CM

## 2019-06-29 DIAGNOSIS — Z20828 Contact with and (suspected) exposure to other viral communicable diseases: Secondary | ICD-10-CM | POA: Diagnosis present

## 2019-06-29 DIAGNOSIS — K219 Gastro-esophageal reflux disease without esophagitis: Secondary | ICD-10-CM | POA: Diagnosis present

## 2019-06-29 DIAGNOSIS — J9601 Acute respiratory failure with hypoxia: Principal | ICD-10-CM | POA: Diagnosis present

## 2019-06-29 DIAGNOSIS — E785 Hyperlipidemia, unspecified: Secondary | ICD-10-CM | POA: Diagnosis present

## 2019-06-29 DIAGNOSIS — Z79891 Long term (current) use of opiate analgesic: Secondary | ICD-10-CM | POA: Diagnosis not present

## 2019-06-29 DIAGNOSIS — Z885 Allergy status to narcotic agent status: Secondary | ICD-10-CM

## 2019-06-29 DIAGNOSIS — F329 Major depressive disorder, single episode, unspecified: Secondary | ICD-10-CM

## 2019-06-29 DIAGNOSIS — Z940 Kidney transplant status: Secondary | ICD-10-CM | POA: Diagnosis not present

## 2019-06-29 DIAGNOSIS — B951 Streptococcus, group B, as the cause of diseases classified elsewhere: Secondary | ICD-10-CM | POA: Diagnosis present

## 2019-06-29 DIAGNOSIS — Z79899 Other long term (current) drug therapy: Secondary | ICD-10-CM

## 2019-06-29 DIAGNOSIS — M109 Gout, unspecified: Secondary | ICD-10-CM | POA: Diagnosis present

## 2019-06-29 DIAGNOSIS — E871 Hypo-osmolality and hyponatremia: Secondary | ICD-10-CM | POA: Diagnosis present

## 2019-06-29 DIAGNOSIS — Z87891 Personal history of nicotine dependence: Secondary | ICD-10-CM | POA: Diagnosis not present

## 2019-06-29 DIAGNOSIS — J9811 Atelectasis: Secondary | ICD-10-CM | POA: Diagnosis present

## 2019-06-29 DIAGNOSIS — R0902 Hypoxemia: Secondary | ICD-10-CM | POA: Diagnosis not present

## 2019-06-29 DIAGNOSIS — Z7982 Long term (current) use of aspirin: Secondary | ICD-10-CM

## 2019-06-29 DIAGNOSIS — H409 Unspecified glaucoma: Secondary | ICD-10-CM | POA: Diagnosis present

## 2019-06-29 DIAGNOSIS — J45901 Unspecified asthma with (acute) exacerbation: Secondary | ICD-10-CM | POA: Diagnosis present

## 2019-06-29 DIAGNOSIS — F418 Other specified anxiety disorders: Secondary | ICD-10-CM | POA: Diagnosis present

## 2019-06-29 DIAGNOSIS — Z7952 Long term (current) use of systemic steroids: Secondary | ICD-10-CM | POA: Diagnosis not present

## 2019-06-29 DIAGNOSIS — I5032 Chronic diastolic (congestive) heart failure: Secondary | ICD-10-CM | POA: Diagnosis present

## 2019-06-29 DIAGNOSIS — R17 Unspecified jaundice: Secondary | ICD-10-CM | POA: Diagnosis present

## 2019-06-29 DIAGNOSIS — E875 Hyperkalemia: Secondary | ICD-10-CM | POA: Diagnosis present

## 2019-06-29 DIAGNOSIS — F32A Depression, unspecified: Secondary | ICD-10-CM | POA: Diagnosis present

## 2019-06-29 DIAGNOSIS — Z881 Allergy status to other antibiotic agents status: Secondary | ICD-10-CM

## 2019-06-29 DIAGNOSIS — D899 Disorder involving the immune mechanism, unspecified: Secondary | ICD-10-CM | POA: Diagnosis present

## 2019-06-29 LAB — BASIC METABOLIC PANEL
Anion gap: 11 (ref 5–15)
BUN: 20 mg/dL (ref 8–23)
CO2: 23 mmol/L (ref 22–32)
Calcium: 9.4 mg/dL (ref 8.9–10.3)
Chloride: 99 mmol/L (ref 98–111)
Creatinine, Ser: 1.14 mg/dL — ABNORMAL HIGH (ref 0.44–1.00)
GFR calc Af Amer: 60 mL/min — ABNORMAL LOW (ref >60)
GFR calc non Af Amer: 51 mL/min — ABNORMAL LOW (ref >60)
Glucose, Bld: 97 mg/dL (ref 70–99)
Potassium: 4.5 mmol/L (ref 3.5–5.1)
Sodium: 133 mmol/L — ABNORMAL LOW (ref 135–145)

## 2019-06-29 LAB — TROPONIN I (HIGH SENSITIVITY)
Troponin I (High Sensitivity): 5 ng/L (ref <18)
Troponin I (High Sensitivity): 5 ng/L (ref <18)

## 2019-06-29 LAB — CBC
HCT: 38.8 % (ref 36.0–46.0)
Hemoglobin: 11.2 g/dL — ABNORMAL LOW (ref 12.0–15.0)
MCH: 27.9 pg (ref 26.0–34.0)
MCHC: 28.9 g/dL — ABNORMAL LOW (ref 30.0–36.0)
MCV: 96.5 fL (ref 80.0–100.0)
Platelets: 171 10*3/uL (ref 150–400)
RBC: 4.02 MIL/uL (ref 3.87–5.11)
RDW: 18.7 % — ABNORMAL HIGH (ref 11.5–15.5)
WBC: 11.5 10*3/uL — ABNORMAL HIGH (ref 4.0–10.5)
nRBC: 0 % (ref 0.0–0.2)

## 2019-06-29 LAB — SARS CORONAVIRUS 2 BY RT PCR (HOSPITAL ORDER, PERFORMED IN ~~LOC~~ HOSPITAL LAB): SARS Coronavirus 2: NEGATIVE

## 2019-06-29 LAB — BRAIN NATRIURETIC PEPTIDE: B Natriuretic Peptide: 224.1 pg/mL — ABNORMAL HIGH (ref 0.0–100.0)

## 2019-06-29 MED ORDER — LATANOPROST 0.005 % OP SOLN
1.0000 [drp] | Freq: Every day | OPHTHALMIC | Status: DC
  Administered 2019-06-30 – 2019-07-01 (×3): 1 [drp] via OPHTHALMIC
  Filled 2019-06-29: qty 2.5

## 2019-06-29 MED ORDER — MAGNESIUM OXIDE 400 (241.3 MG) MG PO TABS
400.0000 mg | ORAL_TABLET | Freq: Two times a day (BID) | ORAL | Status: DC
  Administered 2019-06-30 – 2019-07-02 (×6): 400 mg via ORAL
  Filled 2019-06-29 (×6): qty 1

## 2019-06-29 MED ORDER — ASPIRIN EC 81 MG PO TBEC
81.0000 mg | DELAYED_RELEASE_TABLET | Freq: Every day | ORAL | Status: DC
  Administered 2019-06-30 – 2019-07-02 (×3): 81 mg via ORAL
  Filled 2019-06-29 (×3): qty 1

## 2019-06-29 MED ORDER — PANTOPRAZOLE SODIUM 40 MG PO TBEC
40.0000 mg | DELAYED_RELEASE_TABLET | Freq: Every day | ORAL | Status: DC
  Administered 2019-06-30 – 2019-07-02 (×3): 40 mg via ORAL
  Filled 2019-06-29 (×3): qty 1

## 2019-06-29 MED ORDER — PREDNISOLONE ACETATE 1 % OP SUSP
1.0000 [drp] | Freq: Four times a day (QID) | OPHTHALMIC | Status: DC
  Administered 2019-06-30 (×2): 1 [drp] via OPHTHALMIC
  Filled 2019-06-29: qty 5

## 2019-06-29 MED ORDER — BRIMONIDINE TARTRATE 0.15 % OP SOLN
1.0000 [drp] | Freq: Two times a day (BID) | OPHTHALMIC | Status: DC
  Administered 2019-06-30 – 2019-07-02 (×6): 1 [drp] via OPHTHALMIC
  Filled 2019-06-29: qty 5

## 2019-06-29 MED ORDER — TACROLIMUS 0.5 MG PO CAPS
0.5000 mg | ORAL_CAPSULE | Freq: Every day | ORAL | Status: DC
  Administered 2019-07-01 (×2): 0.5 mg via ORAL

## 2019-06-29 MED ORDER — FLUOXETINE HCL 20 MG PO CAPS
40.0000 mg | ORAL_CAPSULE | Freq: Every day | ORAL | Status: DC
  Administered 2019-06-30 – 2019-07-02 (×3): 40 mg via ORAL
  Filled 2019-06-29 (×3): qty 2

## 2019-06-29 MED ORDER — DORZOLAMIDE HCL 2 % OP SOLN
1.0000 [drp] | Freq: Every day | OPHTHALMIC | Status: DC
  Administered 2019-06-30 – 2019-07-02 (×3): 1 [drp] via OPHTHALMIC
  Filled 2019-06-29: qty 10

## 2019-06-29 MED ORDER — METHYLPREDNISOLONE SODIUM SUCC 125 MG IJ SOLR
125.0000 mg | Freq: Once | INTRAMUSCULAR | Status: AC
  Administered 2019-06-29: 20:00:00 125 mg via INTRAVENOUS
  Filled 2019-06-29: qty 2

## 2019-06-29 MED ORDER — ALBUTEROL SULFATE (2.5 MG/3ML) 0.083% IN NEBU
2.5000 mg | INHALATION_SOLUTION | Freq: Four times a day (QID) | RESPIRATORY_TRACT | Status: DC
  Administered 2019-06-29: 23:00:00 2.5 mg via RESPIRATORY_TRACT
  Filled 2019-06-29: qty 3

## 2019-06-29 MED ORDER — ACETAMINOPHEN 650 MG RE SUPP: 650.0000 mg | Freq: Four times a day (QID) | RECTAL | Status: DC | PRN

## 2019-06-29 MED ORDER — ALBUTEROL SULFATE (2.5 MG/3ML) 0.083% IN NEBU
2.5000 mg | INHALATION_SOLUTION | Freq: Four times a day (QID) | RESPIRATORY_TRACT | Status: DC | PRN
  Filled 2019-06-29: qty 3

## 2019-06-29 MED ORDER — VITAMIN D 25 MCG (1000 UNIT) PO TABS
1000.0000 [IU] | ORAL_TABLET | Freq: Every day | ORAL | Status: DC
  Administered 2019-06-30 – 2019-07-02 (×3): 1000 [IU] via ORAL
  Filled 2019-06-29 (×3): qty 1

## 2019-06-29 MED ORDER — SODIUM CHLORIDE 0.9 % IV SOLN
100.0000 mg | Freq: Two times a day (BID) | INTRAVENOUS | Status: DC
  Administered 2019-06-30 (×2): 100 mg via INTRAVENOUS
  Filled 2019-06-29 (×4): qty 100

## 2019-06-29 MED ORDER — SODIUM CHLORIDE 0.9% FLUSH
3.0000 mL | Freq: Two times a day (BID) | INTRAVENOUS | Status: DC
  Administered 2019-06-29 – 2019-07-01 (×4): 3 mL via INTRAVENOUS

## 2019-06-29 MED ORDER — METHYLPREDNISOLONE SODIUM SUCC 125 MG IJ SOLR
80.0000 mg | Freq: Three times a day (TID) | INTRAMUSCULAR | Status: DC
  Administered 2019-06-30 (×3): 80 mg via INTRAVENOUS
  Filled 2019-06-29 (×4): qty 2

## 2019-06-29 MED ORDER — AEROCHAMBER PLUS FLO-VU MEDIUM MISC
1.0000 | Freq: Once | Status: AC
  Administered 2019-06-29: 19:00:00 1
  Filled 2019-06-29: qty 1

## 2019-06-29 MED ORDER — COLCHICINE 0.6 MG PO TABS: 0.6000 mg | ORAL_TABLET | Freq: Every day | ORAL | Status: DC | PRN

## 2019-06-29 MED ORDER — SODIUM CHLORIDE 0.9% FLUSH: 3.0000 mL | INTRAVENOUS | Status: DC | PRN

## 2019-06-29 MED ORDER — ONDANSETRON HCL 4 MG/2ML IJ SOLN
4.0000 mg | Freq: Once | INTRAMUSCULAR | Status: AC
  Administered 2019-06-29: 18:00:00 4 mg via INTRAVENOUS
  Filled 2019-06-29: qty 2

## 2019-06-29 MED ORDER — MYCOPHENOLATE SODIUM 180 MG PO TBEC
180.0000 mg | DELAYED_RELEASE_TABLET | Freq: Two times a day (BID) | ORAL | Status: DC
  Administered 2019-06-30 – 2019-07-02 (×6): 180 mg via ORAL
  Filled 2019-06-29 (×7): qty 1

## 2019-06-29 MED ORDER — ACETAMINOPHEN 325 MG PO TABS: 650.0000 mg | ORAL_TABLET | Freq: Four times a day (QID) | ORAL | Status: DC | PRN

## 2019-06-29 MED ORDER — ENOXAPARIN SODIUM 40 MG/0.4ML ~~LOC~~ SOLN
40.0000 mg | Freq: Every day | SUBCUTANEOUS | Status: DC
  Administered 2019-06-29 – 2019-07-01 (×3): 40 mg via SUBCUTANEOUS
  Filled 2019-06-29 (×3): qty 0.4

## 2019-06-29 MED ORDER — ALBUTEROL SULFATE (2.5 MG/3ML) 0.083% IN NEBU
2.5000 mg | INHALATION_SOLUTION | Freq: Three times a day (TID) | RESPIRATORY_TRACT | Status: DC
  Administered 2019-06-30 – 2019-07-01 (×4): 2.5 mg via RESPIRATORY_TRACT
  Filled 2019-06-29 (×5): qty 3

## 2019-06-29 MED ORDER — SODIUM CHLORIDE 0.9 % IV SOLN: 250.0000 mL | INTRAVENOUS | Status: DC | PRN

## 2019-06-29 MED ORDER — ALLOPURINOL 300 MG PO TABS
300.0000 mg | ORAL_TABLET | Freq: Every day | ORAL | Status: DC
  Administered 2019-06-30 – 2019-07-02 (×3): 300 mg via ORAL
  Filled 2019-06-29 (×3): qty 1

## 2019-06-29 MED ORDER — MOMETASONE FURO-FORMOTEROL FUM 100-5 MCG/ACT IN AERO
2.0000 | INHALATION_SPRAY | Freq: Two times a day (BID) | RESPIRATORY_TRACT | Status: DC
  Administered 2019-06-30 – 2019-07-02 (×5): 2 via RESPIRATORY_TRACT
  Filled 2019-06-29: qty 8.8

## 2019-06-29 MED ORDER — ACETAMINOPHEN 325 MG PO TABS
650.0000 mg | ORAL_TABLET | Freq: Once | ORAL | Status: AC
  Administered 2019-06-29: 650 mg via ORAL
  Filled 2019-06-29: qty 2

## 2019-06-29 MED ORDER — CYCLOSPORINE 0.05 % OP EMUL
1.0000 [drp] | Freq: Two times a day (BID) | OPHTHALMIC | Status: DC
  Administered 2019-06-30 – 2019-07-02 (×6): 1 [drp] via OPHTHALMIC
  Filled 2019-06-29 (×7): qty 30

## 2019-06-29 MED ORDER — FUROSEMIDE 40 MG PO TABS
40.0000 mg | ORAL_TABLET | Freq: Every day | ORAL | Status: DC
  Administered 2019-06-30 – 2019-07-02 (×3): 40 mg via ORAL
  Filled 2019-06-29 (×3): qty 1

## 2019-06-29 MED ORDER — PREDNISOLONE ACETATE 1 % OP SUSP: 1.0000 [drp] | Freq: Every day | OPHTHALMIC | Status: DC

## 2019-06-29 MED ORDER — ALBUTEROL SULFATE HFA 108 (90 BASE) MCG/ACT IN AERS
8.0000 | INHALATION_SPRAY | Freq: Once | RESPIRATORY_TRACT | Status: AC
  Administered 2019-06-29: 18:00:00 8 via RESPIRATORY_TRACT
  Filled 2019-06-29: qty 6.7

## 2019-06-29 MED ORDER — TACROLIMUS 1 MG PO CAPS
1.0000 mg | ORAL_CAPSULE | Freq: Every day | ORAL | Status: DC
  Administered 2019-07-01 – 2019-07-02 (×2): 1 mg via ORAL

## 2019-06-29 NOTE — H&P (Addendum)
TRH H&P    Patient Demographics:    April [name], is a 63 y.o. female  MRN: [id number]  DOB - [birth year]  Admit Date - 06/29/2019  Referring MD/NP/PA:  Madalyn Rob  Outpatient Primary MD for the patient is [name], MD  Patient coming from:  home  Chief complaint- dyspnea   HPI:    April [name]  is a 63 y.o. female,  W/ history of Jerrye Bushy, polycystic kidney, ESRD s/p renal transplant , hyperlipidemia, chronic diastolic CHF, asthma, who presents with c/o dyspnea. Dyspnea since Monday.  Pt notes slight cough w yellow- green sputum.  Denies fever, chills, cp, palp, orthopnea, pnd, n/v, abd pain, diarrhea, brbpr, black stool, lower ext edema.   In ED,  T 99.8 P 107 R 24, Bp 149/67  Pox 86% on RA  Na 133, K 4.5, Bun 20, Creatinine 1.14 Wbc 11.5, Hgb 11.2, Plt 171 Trop 5  BNP 224.1 (prior 1,089.2)  CXR IMPRESSION: There is a degree of pulmonary vascular congestion. Bibasilar atelectasis. No edema or consolidation.  Pt treated with solumedrol 125mg  iv x1, and albuterol HFA 8 puffs without improvement  Pt will be admitted for acute respiratory failure w hypoxia secondary to asthma exacerbation   Review of systems:    In addition to the HPI above,  No Fever-chills, No Headache, No changes with Vision or hearing, No problems swallowing food or Liquids, No Chest pain,   No Abdominal pain, No Nausea or Vomiting, bowel movements are regular, No Blood in stool or Urine, No dysuria, No new skin rashes or bruises, No new joints pains-aches,  No new weakness, tingling, numbness in any extremity, No recent weight gain or loss, No polyuria, polydypsia or polyphagia, No significant Mental Stressors.  All other systems reviewed and are negative.    Past History of the following :    Past Medical History:  Diagnosis Date  . Arthritis   . Asthma   . Depression   . GERD  (gastroesophageal reflux disease)   . Hyperlipidemia   . Hyperparathyroidism   . Polycystic kidney   . PONV (postoperative nausea and vomiting)       Past Surgical History:  Procedure Laterality Date  . 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 BILATERAL OOPHORECTOMY    . CARPAL TUNNEL RELEASE  2000  . CESAREAN SECTION    . INSERTION OF DIALYSIS CATHETER  03/31/2012   Procedure: INSERTION OF DIALYSIS CATHETER;  Surgeon: Angelia Mould, MD;  Location: [facility];  Service: Vascular;  Laterality: N/A;  insertion of dialysis catheter right internal jugular vein  . 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 ORIF TIBIA FRACTURE Left 12/23/2018   Procedure: OPEN REDUCTION INTERNAL FIXATION (ORIF) TIBIA FRACTURE;  Surgeon: Shona Needles, MD;  Location: [facility];  Service: Orthopedics;  Laterality: Left;  . ORIF TIBIA PLATEAU Right 12/23/2018   Procedure: OPEN REDUCTION INTERNAL FIXATION (ORIF) TIBIAL PLATEAU;  Surgeon: Shona Needles, MD;  Location: [facility];  Service: Orthopedics;  Laterality: Right;  .  TONSILLECTOMY  1967  . TUBAL LIGATION  2010      Social History:      Social History   Tobacco Use  . Smoking status: Former Smoker    Packs/day: 0.25    Years: 15.00    Pack years: 3.75    Types: Cigarettes    Quit date: 03/22/1998    Years since quitting: 21.2  . Smokeless tobacco: Never Used  Substance Use Topics  . Alcohol use: No    Comment: social       Family History :     Family History  Problem Relation Age of Onset  . Hypertension Mother   . Heart disease Father        CABG history  . Polycystic kidney disease Father   . Polycystic kidney disease Brother        Home Medications:   Prior to Admission medications   Medication Sig Start Date End Date Taking? Authorizing Provider  acetaminophen (TYLENOL) 500 MG tablet Take 1,000 mg by mouth every 6 (six) hours as needed for moderate pain.    [provider]  acyclovir (ZOVIRAX) 400 MG  tablet Take 400 mg by mouth 2 (two) times daily as needed (outbreaks).     [provider]  albuterol (PROVENTIL HFA;VENTOLIN HFA) 108 (90 Base) MCG/ACT inhaler Inhale 2 puffs into the lungs every 4 (four) hours as needed for wheezing or shortness of breath. 07/16/16   Ghimire, Henreitta Leber, MD  allopurinol (ZYLOPRIM) 300 MG tablet Take 300 mg by mouth daily.    [provider]  aspirin EC 81 MG tablet Take 81 mg by mouth daily.    [provider]  brimonidine (ALPHAGAN P) 0.1 % SOLN Place 1 drop 2 (two) times daily into the left eye.    [provider]  cholecalciferol (VITAMIN D3) 25 MCG (1000 UT) tablet Take 1,000 Units by mouth every other day.     [provider]  Colchicine 0.6 MG CAPS Take 0.6 mg by mouth daily as needed (gout).     [provider]  cycloSPORINE (RESTASIS) 0.05 % ophthalmic emulsion Place 1 drop into both eyes 2 (two) times daily.    [provider]  FLUoxetine (PROZAC) 40 MG capsule Take 40 mg by mouth daily.    [provider]  furosemide (LASIX) 40 MG tablet Take 1 tablet by mouth daily.  04/20/14   [provider]  HYDROcodone-acetaminophen (NORCO/VICODIN) 5-325 MG tablet Take 1-2 tablets by mouth every 4 (four) hours as needed for moderate pain. 12/29/18   Spongberg, Audie Pinto, MD  ipratropium-albuterol (DUONEB) 0.5-2.5 (3) MG/3ML SOLN Take 3 mLs by nebulization every 6 (six) hours as needed (shortness of breath).  03/11/19   [provider]  latanoprost (XALATAN) 0.005 % ophthalmic solution Place 1 drop into both eyes at bedtime.    [provider]  magnesium oxide (MAG-OX) 400 MG tablet Take 400 mg by mouth 2 (two) times daily.    [provider]  mycophenolate (MYFORTIC) 180 MG EC tablet Take 180 mg by mouth 2 (two) times daily.    [provider]  ondansetron (ZOFRAN ODT) 4 MG disintegrating tablet 4mg  ODT q4 hours prn nausea/vomit Patient taking  differently: Take 4 mg by mouth every 4 (four) hours as needed for nausea or vomiting.  03/31/19   Drenda Freeze, MD  pantoprazole (PROTONIX) 40 MG tablet Take 40 mg by mouth daily.     [provider]  prednisoLONE  acetate (PRED FORTE) 1 % ophthalmic suspension Place 1 drop into both eyes See admin instructions. Instill 1 drop left eye four times daily and 1 drop right eye once daily.    [provider]  predniSONE (DELTASONE) 5 MG tablet Take 7.5 mg by mouth daily.     [provider]  PROGRAF 1 MG capsule Take 1 mg by mouth daily. 03/22/19   [provider]  SYMBICORT 80-4.5 MCG/ACT inhaler Inhale 2 puffs into the lungs 2 (two) times a day.  03/11/19   [provider]  tacrolimus (PROGRAF) 0.5 MG capsule Take 0.5 mg by mouth at bedtime. Takes brand name Prograf    [provider]  TRAVATAN Z 0.004 % SOLN ophthalmic solution Place 1 drop into both eyes at bedtime. 07/03/16   [provider]  vitamin B-12 (CYANOCOBALAMIN) 100 MCG tablet Take 100 mcg by mouth daily.    [provider]     Allergies:     Allergies  Allergen Reactions  . Infed [Iron Dextran] Other (See Comments)    Chest tightness  . Pentamidine Itching, Shortness Of Breath and Swelling  . Erythromycin [Erythromycin] Other (See Comments)    Mouth Ulcers  . Oxycodone Nausea Only and Nausea And Vomiting  . Erythromycin Rash    Causes breakout in mouth  . Ultram [Tramadol Hcl] Anxiety     Physical Exam:   Vitals  Blood pressure 130/60, pulse 99, temperature 99.8 F (37.7 C), temperature source Oral, resp. rate (!) 23, SpO2 99 %.  1.  General: axoxo3  2. Psychiatric: euthymic  3. Neurologic: cn2-12 intact, reflexes 2+ symmetric, diffuse with no clonus, motor 5/5 in all 4 ext  4. HEENMT:  Anicteric, pupils 1.63mm symmetric, direct consensual, near intact Neck:  equivocal jvd,   5. Respiratory : Tight, prolonged exp phase, slight crackles  bilateral base, + wheezing bilateral lung fields  6. Cardiovascular : rrr s1, s2,   7. Gastrointestinal:  Abd: soft, nt, nd, +bs  8. Skin:  Ext: no c/c/e  9.Musculoskeletal:  Good ROM,  No adenopathy    Data Review:    CBC Recent Labs  Lab 06/29/19 1419  WBC 11.5*  HGB 11.2*  HCT 38.8  PLT 171  MCV 96.5  MCH 27.9  MCHC 28.9*  RDW 18.7*   ------------------------------------------------------------------------------------------------------------------  Results for orders placed or performed during the hospital encounter of 06/29/19 (from the past 48 hour(s))  Basic metabolic panel     Status: Abnormal   Collection Time: 06/29/19  2:19 PM  Result Value Ref Range   Sodium 133 (L) 135 - 145 mmol/L   Potassium 4.5 3.5 - 5.1 mmol/L   Chloride 99 98 - 111 mmol/L   CO2 23 22 - 32 mmol/L   Glucose, Bld 97 70 - 99 mg/dL   BUN 20 8 - 23 mg/dL   Creatinine, Ser 1.14 (H) 0.44 - 1.00 mg/dL   Calcium 9.4 8.9 - 10.3 mg/dL   GFR calc non Af Amer 51 (L) >60 mL/min   GFR calc Af Amer 60 (L) >60 mL/min   Anion gap 11 5 - 15    Comment: Performed at [facility], [place] [address], [place], [place] [address]  CBC     Status: Abnormal   Collection Time: 06/29/19  2:19 PM  Result Value Ref Range   WBC 11.5 (H) 4.0 - 10.5 K/uL   RBC 4.02 3.87 - 5.11 MIL/uL   Hemoglobin 11.2 (L) 12.0 - 15.0  g/dL   HCT 38.8 36.0 - 46.0 %   MCV 96.5 80.0 - 100.0 fL   MCH 27.9 26.0 - 34.0 pg   MCHC 28.9 (L) 30.0 - 36.0 g/dL   RDW 18.7 (H) 11.5 - 15.5 %   Platelets 171 150 - 400 K/uL   nRBC 0.0 0.0 - 0.2 %    Comment: Performed at [facility], [place] [address] [place], [place] [address]  Troponin I (High Sensitivity)     Status: None   Collection Time: 06/29/19  2:19 PM  Result Value Ref Range   Troponin I (High Sensitivity) 5 <18 ng/L    Comment: (NOTE) Elevated high sensitivity troponin I (hsTnI) values and significant  changes across serial measurements may  suggest ACS but many other  chronic and acute conditions are known to elevate hsTnI results.  Refer to the "Links" section for chest pain algorithms and additional  guidance. Performed at [facility], [place] [address], [place], [place] [address]   Brain natriuretic peptide     Status: Abnormal   Collection Time: 06/29/19  2:19 PM  Result Value Ref Range   B Natriuretic Peptide 224.1 (H) 0.0 - 100.0 pg/mL    Comment: Performed at [facility], [place] [address] [place], [place] [address]  SARS Coronavirus 2 [facility] order, Performed in [facility] hospital lab) Nasopharyngeal Nasopharyngeal Swab     Status: None   Collection Time: 06/29/19  5:44 PM   Specimen: Nasopharyngeal Swab  Result Value Ref Range   SARS Coronavirus 2 NEGATIVE NEGATIVE    Comment: (NOTE) If result is NEGATIVE SARS-CoV-2 target nucleic acids are NOT DETECTED. The SARS-CoV-2 RNA is generally detectable in upper and lower  respiratory specimens during the acute phase of infection. The lowest  concentration of SARS-CoV-2 viral copies this assay can detect is 250  copies / mL. A negative result does not preclude SARS-CoV-2 infection  and should not be used as the sole basis for treatment or other  patient management decisions.  A negative result may occur with  improper specimen collection / handling, submission of specimen other  than nasopharyngeal swab, presence of viral mutation(s) within the  areas targeted by this assay, and inadequate number of viral copies  (<250 copies / mL). A negative result must be combined with clinical  observations, patient history, and epidemiological information. If result is POSITIVE SARS-CoV-2 target nucleic acids are DETECTED. The SARS-CoV-2 RNA is generally detectable in upper and lower  respiratory specimens dur ing the acute phase of infection.  Positive  results are indicative of active infection with SARS-CoV-2.  Clinical  correlation  with patient history and other diagnostic information is  necessary to determine patient infection status.  Positive results do  not rule out bacterial infection or co-infection with other viruses. If result is PRESUMPTIVE POSTIVE SARS-CoV-2 nucleic acids MAY BE PRESENT.   A presumptive positive result was obtained on the submitted specimen  and confirmed on repeat testing.  While 2019 novel coronavirus  (SARS-CoV-2) nucleic acids may be present in the submitted sample  additional confirmatory testing may be necessary for epidemiological  and / or clinical management purposes  to differentiate between  SARS-CoV-2 and other Sarbecovirus currently known to infect humans.  If clinically indicated additional testing with an alternate test  methodology [phone]) is advised. The SARS-CoV-2 RNA is generally  detectable in upper and lower respiratory sp ecimens during the acute  phase of infection. The expected result is  Negative. Fact Sheet for Patients:  StrictlyIdeas.no Fact Sheet for Healthcare Providers: BankingDealers.co.za This test is not yet approved or cleared by the [place] FDA and has been authorized for detection and/or diagnosis of SARS-CoV-2 by FDA under an Emergency Use Authorization (EUA).  This EUA will remain in effect (meaning this test can be used) for the duration of the COVID-19 declaration under Section 564(b)(1) of the Act, 21 U.S.C. section 360bbb-3(b)(1), unless the authorization is terminated or revoked sooner. Performed at [facility], [place] [address], [place], [place] [address]   Troponin I (High Sensitivity)     Status: None   Collection Time: 06/29/19  6:14 PM  Result Value Ref Range   Troponin I (High Sensitivity) 5 <18 ng/L    Comment: (NOTE) Elevated high sensitivity troponin I (hsTnI) values and significant  changes across serial measurements may suggest ACS but many other  chronic  and acute conditions are known to elevate hsTnI results.  Refer to the "Links" section for chest pain algorithms and additional  guidance. Performed at [facility], [place] [address] [place], [place] [address]     Chemistries  Recent Labs  Lab 06/29/19 1419  NA 133*  K 4.5  CL 99  CO2 23  GLUCOSE 97  BUN 20  CREATININE 1.14*  CALCIUM 9.4   ------------------------------------------------------------------------------------------------------------------  ------------------------------------------------------------------------------------------------------------------ GFR: CrCl cannot be calculated (Unknown ideal weight.). Liver Function Tests: No results for input(s): AST, ALT, ALKPHOS, BILITOT, PROT, ALBUMIN in the last 168 hours. No results for input(s): LIPASE, AMYLASE in the last 168 hours. No results for input(s): AMMONIA in the last 168 hours. Coagulation Profile: No results for input(s): INR, PROTIME in the last 168 hours. Cardiac Enzymes: No results for input(s): CKTOTAL, CKMB, CKMBINDEX, TROPONINI in the last 168 hours. BNP (last 3 results) No results for input(s): PROBNP in the last 8760 hours. HbA1C: No results for input(s): HGBA1C in the last 72 hours. CBG: No results for input(s): GLUCAP in the last 168 hours. Lipid Profile: No results for input(s): CHOL, HDL, LDLCALC, TRIG, CHOLHDL, LDLDIRECT in the last 72 hours. Thyroid Function Tests: No results for input(s): TSH, T4TOTAL, FREET4, T3FREE, THYROIDAB in the last 72 hours. Anemia Panel: No results for input(s): VITAMINB12, FOLATE, FERRITIN, TIBC, IRON, RETICCTPCT in the last 72 hours.  --------------------------------------------------------------------------------------------------------------- Urine analysis:    Component Value Date/Time   COLORURINE YELLOW 04/19/2019 1703   APPEARANCEUR CLEAR 04/19/2019 1703   LABSPEC 1.009 04/19/2019 1703   PHURINE 5.0 04/19/2019 1703    GLUCOSEU NEGATIVE 04/19/2019 1703   HGBUR MODERATE (A) 04/19/2019 1703   BILIRUBINUR NEGATIVE 04/19/2019 1703   KETONESUR NEGATIVE 04/19/2019 1703   PROTEINUR 100 (A) 04/19/2019 1703   UROBILINOGEN 0.2 05/15/2013 0733   NITRITE NEGATIVE 04/19/2019 1703   LEUKOCYTESUR MODERATE (A) 04/19/2019 1703      Imaging Results:    Dg Chest 2 View  Result Date: 06/29/2019 CLINICAL DATA:  Shortness of breath and chest tightness EXAM: CHEST - 2 VIEW COMPARISON:  Apr 19, 2019 FINDINGS: There is bibasilar atelectasis. There is no edema or consolidation. Heart is borderline prominent with mild pulmonary venous hypertension. No adenopathy. No bone lesions. IMPRESSION: There is a degree of pulmonary vascular congestion. Bibasilar atelectasis. No edema or consolidation. Electronically Signed   By: Lowella Grip III M.D.   On: 06/29/2019 14:41    ST at 105, nl axis, nl int, no st-t changes c/w ischemia   Assessment & Plan:    Principal Problem:  Acute respiratory failure with hypoxia (HCC) Active Problems:   GERD (gastroesophageal reflux disease)   Hyperlipidemia   Depression   Chronic diastolic CHF (congestive heart failure) (HCC)   Asthma, chronic, unspecified asthma severity, with acute exacerbation  Acute respiratory failure with hypoxia Secondary to asthma exacerbation, and chronic diastolic CHF Solumedrol 80mg  iv q8h Cont Symbicort 2puff bid  Albuterol neb q6h and q6h prn Zithromax 500mg  iv qday Lasix 40mg  iv x1  Chronic diastolic CHF Lasix 40mg  po qday  Depression Cont prozac 40mg  po qday  Gerd Cont PPI  H/o renal transplant Prednisone -> solumedrol above, at baseline on 7.5mg  po qday Cont Prograf Cont Myfortic  Glaucoma Cont Alphagan Cont Trusopt Cont Xalatan Cont Travatan   Gout Cont Allopurinol 300mg  po qday Cont Colchicine 0.6mg  po qday prn   DVT Prophylaxis-   Lovenox - SCDs  AM Labs Ordered, also please review Full Orders  Family Communication:  Admission, patients condition and plan of care including tests being ordered have been discussed with the patient  who indicate understanding and agree with the plan and Code Status.  Code Status:  FULL CODE, notified husband pt admitted to [facility]  Admission status: Observation/Inpatient: Based on patients clinical presentation and evaluation of above clinical data, I have made determination that patient meets Inpatient criteria at this time.  Pt has high risk of clinical deterioration,  Pt hypoxic with pox 86%, requiring o2 [place] as well as iv solumedrol and zithromax.  Pt didn't improve in Ed, and will likely require > 2 nites stay to improve enough to go home, and may require home o2,   Time spent in minutes : 70   Jani Gravel M.D on 06/29/2019 at 8:33 PM

## 2019-06-29 NOTE — ED Notes (Signed)
ED TO INPATIENT HANDOFF REPORT  Name/Age/Gender April [name] 63 y.o. female  Code Status    Code Status Orders  (From admission, onward)         Start     Ordered   06/29/19 2043  Full code  Continuous     06/29/19 2043        Code Status History    Date Active Date Inactive Code Status Order ID Comments User Context   04/19/2019 1739 04/23/2019 1713 Full Code [id number]  Mariel Aloe, MD Inpatient   12/22/2018 0125 12/29/2018 2007 Full Code [id number]  Toy Baker, MD Inpatient   10/03/2018 0047 10/05/2018 1907 Full Code [id number]  Rise Patience, MD Inpatient   10/01/2017 2110 10/02/2017 1942 Full Code [id number]  Vianne Bulls, MD ED   07/15/2016 2352 07/16/2016 2035 Full Code [id number]  Reubin Milan, MD ED   Advance Care Planning Activity      Home/SNF/Other Home  Chief Complaint nausea,headache  Level of Care/Admitting Diagnosis ED Disposition    ED Disposition Condition [place]: [facility] [[id number]]  Level of Care: Telemetry [5]  Admit to tele based on following criteria: Monitor for Ischemic changes  Covid Evaluation: Confirmed COVID Negative  Diagnosis: Acute respiratory failure with hypoxia [facility]) [[id number]]  Admitting Physician: Jani Gravel [[id number]]  Attending Physician: Jani Gravel [phone]  Estimated length of stay: past midnight tomorrow  Certification:: [name] will need inpatient services for at least 2 midnights  PT Class (Do Not Modify): Inpatient [101]  PT Acc Code (Do Not Modify): Private [1]       Medical History Past Medical History:  Diagnosis Date  . Arthritis   . Asthma   . Depression   . GERD (gastroesophageal reflux disease)   . Hyperlipidemia   . Hyperparathyroidism   . Polycystic kidney   . PONV (postoperative nausea and vomiting)     Allergies Allergies  Allergen Reactions  . Infed [Iron Dextran] Other (See Comments)    Chest tightness  .  Pentamidine Itching, Shortness Of Breath and Swelling  . Erythromycin [Erythromycin] Other (See Comments)    Mouth Ulcers  . Oxycodone Nausea Only and Nausea And Vomiting  . Erythromycin Rash    Causes breakout in mouth  . Ultram [Tramadol Hcl] Anxiety    IV Location/Drains/Wounds Patient Lines/Drains/Airways Status   Active Line/Drains/Airways    Name:   Placement date:   Placement time:   Site:   Days:   Peripheral IV 06/29/19 Right Wrist   06/29/19    1805    Wrist   less than 1   Vascular Access Left Upper arm Arteriovenous fistula   -    -    Upper arm      External Urinary Catheter   04/19/19    1700    -   71   Incision (Closed) 12/23/18 Leg Right   12/23/18    0949     188   Incision (Closed) 12/23/18 Pretibial Left   12/23/18    1032     188          Labs/Imaging Results for orders placed or performed during the hospital encounter of 06/29/19 (from the past 48 hour(s))  Basic metabolic panel     Status: Abnormal   Collection Time: 06/29/19  2:19 PM  Result Value Ref Range   Sodium 133 (L) 135 - 145 mmol/L   Potassium 4.5  3.5 - 5.1 mmol/L   Chloride 99 98 - 111 mmol/L   CO2 23 22 - 32 mmol/L   Glucose, Bld 97 70 - 99 mg/dL   BUN 20 8 - 23 mg/dL   Creatinine, Ser 1.14 (H) 0.44 - 1.00 mg/dL   Calcium 9.4 8.9 - 10.3 mg/dL   GFR calc non Af Amer 51 (L) >60 mL/min   GFR calc Af Amer 60 (L) >60 mL/min   Anion gap 11 5 - 15    Comment: Performed at [facility], [place] [address], [place], [place] [address]  CBC     Status: Abnormal   Collection Time: 06/29/19  2:19 PM  Result Value Ref Range   WBC 11.5 (H) 4.0 - 10.5 K/uL   RBC 4.02 3.87 - 5.11 MIL/uL   Hemoglobin 11.2 (L) 12.0 - 15.0 g/dL   HCT 38.8 36.0 - 46.0 %   MCV 96.5 80.0 - 100.0 fL   MCH 27.9 26.0 - 34.0 pg   MCHC 28.9 (L) 30.0 - 36.0 g/dL   RDW 18.7 (H) 11.5 - 15.5 %   Platelets 171 150 - 400 K/uL   nRBC 0.0 0.0 - 0.2 %    Comment: Performed at [facility], [place]  [address] [place], [place] [address]  Troponin I (High Sensitivity)     Status: None   Collection Time: 06/29/19  2:19 PM  Result Value Ref Range   Troponin I (High Sensitivity) 5 <18 ng/L    Comment: (NOTE) Elevated high sensitivity troponin I (hsTnI) values and significant  changes across serial measurements may suggest ACS but many other  chronic and acute conditions are known to elevate hsTnI results.  Refer to the "Links" section for chest pain algorithms and additional  guidance. Performed at [facility], [place] [address], [place], [place] [address]   Brain natriuretic peptide     Status: Abnormal   Collection Time: 06/29/19  2:19 PM  Result Value Ref Range   B Natriuretic Peptide 224.1 (H) 0.0 - 100.0 pg/mL    Comment: Performed at [facility], [place] [address] [place], [place] [address]  SARS Coronavirus 2 [facility] order, Performed in [facility] hospital lab) Nasopharyngeal Nasopharyngeal Swab     Status: None   Collection Time: 06/29/19  5:44 PM   Specimen: Nasopharyngeal Swab  Result Value Ref Range   SARS Coronavirus 2 NEGATIVE NEGATIVE    Comment: (NOTE) If result is NEGATIVE SARS-CoV-2 target nucleic acids are NOT DETECTED. The SARS-CoV-2 RNA is generally detectable in upper and lower  respiratory specimens during the acute phase of infection. The lowest  concentration of SARS-CoV-2 viral copies this assay can detect is 250  copies / mL. A negative result does not preclude SARS-CoV-2 infection  and should not be used as the sole basis for treatment or other  patient management decisions.  A negative result may occur with  improper specimen collection / handling, submission of specimen other  than nasopharyngeal swab, presence of viral mutation(s) within the  areas targeted by this assay, and inadequate number of viral copies  (<250 copies / mL). A negative result must be combined with clinical  observations, patient history, and  epidemiological information. If result is POSITIVE SARS-CoV-2 target nucleic acids are DETECTED. The SARS-CoV-2 RNA is generally detectable in upper and lower  respiratory specimens dur ing the acute phase of infection.  Positive  results are indicative of active infection with SARS-CoV-2.  Clinical  correlation with patient history and other diagnostic information is  necessary to determine patient infection status.  Positive results do  not rule out bacterial infection or co-infection with other viruses. If result is PRESUMPTIVE POSTIVE SARS-CoV-2 nucleic acids MAY BE PRESENT.   A presumptive positive result was obtained on the submitted specimen  and confirmed on repeat testing.  While 2019 novel coronavirus  (SARS-CoV-2) nucleic acids may be present in the submitted sample  additional confirmatory testing may be necessary for epidemiological  and / or clinical management purposes  to differentiate between  SARS-CoV-2 and other Sarbecovirus currently known to infect humans.  If clinically indicated additional testing with an alternate test  methodology [phone]) is advised. The SARS-CoV-2 RNA is generally  detectable in upper and lower respiratory sp ecimens during the acute  phase of infection. The expected result is Negative. Fact Sheet for Patients:  StrictlyIdeas.no Fact Sheet for Healthcare Providers: BankingDealers.co.za This test is not yet approved or cleared by the [place] FDA and has been authorized for detection and/or diagnosis of SARS-CoV-2 by FDA under an Emergency Use Authorization (EUA).  This EUA will remain in effect (meaning this test can be used) for the duration of the COVID-19 declaration under Section 564(b)(1) of the Act, 21 U.S.C. section 360bbb-3(b)(1), unless the authorization is terminated or revoked sooner. Performed at [facility], [place] [address], [place], [place] [address]    Troponin I (High Sensitivity)     Status: None   Collection Time: 06/29/19  6:14 PM  Result Value Ref Range   Troponin I (High Sensitivity) 5 <18 ng/L    Comment: (NOTE) Elevated high sensitivity troponin I (hsTnI) values and significant  changes across serial measurements may suggest ACS but many other  chronic and acute conditions are known to elevate hsTnI results.  Refer to the "Links" section for chest pain algorithms and additional  guidance. Performed at [facility], [place] [address], [place], [place] [address]    Dg Chest 2 View  Result Date: 06/29/2019 CLINICAL DATA:  Shortness of breath and chest tightness EXAM: CHEST - 2 VIEW COMPARISON:  Apr 19, 2019 FINDINGS: There is bibasilar atelectasis. There is no edema or consolidation. Heart is borderline prominent with mild pulmonary venous hypertension. No adenopathy. No bone lesions. IMPRESSION: There is a degree of pulmonary vascular congestion. Bibasilar atelectasis. No edema or consolidation. Electronically Signed   By: Lowella Grip III M.D.   On: 06/29/2019 14:41    Pending Labs Unresulted Labs (From admission, onward)    Start     Ordered   07/06/19 0500  Creatinine, serum  (enoxaparin (LOVENOX)    CrCl >/= 30 ml/min)  Weekly,   R    Comments: while on enoxaparin therapy    06/29/19 2043   06/30/19 0500  Comprehensive metabolic panel  Tomorrow morning,   R     06/29/19 2043   06/30/19 0500  CBC  Tomorrow morning,   R     06/29/19 2043          Vitals/Pain Today's Vitals   06/29/19 2002 06/29/19 2030 06/29/19 2100 06/29/19 2130  BP: 130/60 (!) 142/70 (!) 142/71 136/72  Pulse: 99 (!) 102 (!) 101 97  Resp: (!) 23 (!) 32 (!) 22 (!) 23  Temp:      TempSrc:      SpO2: 99% 97% 98% 96%  PainSc:  4       Isolation Precautions No active isolations  Medications Medications  enoxaparin (LOVENOX) injection 40 mg (has no administration in time range)  sodium chloride flush (NS) 0.9 % injection  3 mL (has no administration in time range)  sodium chloride flush (NS) 0.9 % injection 3 mL (has no administration in time range)  0.9 %  sodium chloride infusion (has no administration in time range)  acetaminophen (TYLENOL) tablet 650 mg (has no administration in time range)    Or  acetaminophen (TYLENOL) suppository 650 mg (has no administration in time range)  albuterol (VENTOLIN HFA) 108 (90 Base) MCG/ACT inhaler 8 puff (8 puffs Inhalation Given 06/29/19 1810)  AeroChamber Plus Flo-Vu Medium MISC 1 each (1 each Other Given 06/29/19 1834)  ondansetron (ZOFRAN) injection 4 mg (4 mg Intravenous Given 06/29/19 1808)  methylPREDNISolone sodium succinate (SOLU-MEDROL) 125 mg/2 mL injection 125 mg (125 mg Intravenous Given 06/29/19 2026)  acetaminophen (TYLENOL) tablet 650 mg (650 mg Oral Given 06/29/19 2032)    Mobility walks

## 2019-06-29 NOTE — ED Triage Notes (Signed)
Patient reports SOB with chest tightness, headache, and nausea x3 days. Hx asthma. 86% on RA.

## 2019-06-29 NOTE — ED Provider Notes (Signed)
[facility] DEPT Provider Note   CSN: [id number] Arrival date & time: 06/29/19  1235    History   Chief Complaint Chief Complaint  Patient presents with  . Shortness of Breath    HPI April [name] is a 63 y.o. female past medical history of asthma, GERD, hypertension, hyperlipidemia, diastolic CHF, ESRD status post kidney transplant, presenting to the emergency department with complaint of shortness of breath consistent with her asthma symptoms.  She states over the last few days she has been feeling chest tightness and shortness of breath.  She is also had some fevers at home, however has not treated them.  She been treating her symptoms with her inhalers without significant improvement.  She states she feels more short of breath when she lays flat and exerts herself.  She was reports associated body aches, headache, and nausea.  She denies new cough or contact with COVID positive people. Per chart review, patient had echo 04/21/2019 with EF of 55 to 60%.    The history is provided by the patient and medical records.    Past Medical History:  Diagnosis Date  . Arthritis   . Asthma   . Depression   . GERD (gastroesophageal reflux disease)   . Hyperlipidemia   . Hyperparathyroidism   . Polycystic kidney   . PONV (postoperative nausea and vomiting)     Patient Active Problem List   Diagnosis Date Noted  . Hyponatremia 06/29/2019  . Acute respiratory failure with hypoxia ([place]) 04/19/2019  . Acute on chronic diastolic CHF (congestive heart failure) ([place]) 04/19/2019  . Bilateral closed proximal tibial fracture 12/21/2018  . Closed right ankle fracture 12/21/2018  . Fracture 12/21/2018  . Lower urinary tract infectious disease 10/03/2018  . Asthma, chronic, unspecified asthma severity, with acute exacerbation 10/03/2018  . SIRS (systemic inflammatory response syndrome) ([place]) 10/03/2018  . Nausea vomiting and diarrhea 10/02/2018  . Chronic  diastolic CHF (congestive heart failure) ([place]) 10/01/2017  . Influenza B 10/01/2017  . Persistent asthma with status asthmaticus   . Pneumonia 07/15/2016  . Asthma, mild intermittent 07/15/2016  . GERD (gastroesophageal reflux disease) 07/15/2016  . Renal transplant recipient 07/15/2016  . Hyperlipidemia 07/15/2016  . Depression 07/15/2016  . Bronchitis, mucopurulent recurrent ([place]) 08/08/2014  . Chronic cough 07/24/2014  . End stage renal disease ([place]) 03/23/2012  . 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 BILATERAL OOPHORECTOMY    . CARPAL TUNNEL RELEASE  2000  . CESAREAN SECTION    . INSERTION OF DIALYSIS CATHETER  03/31/2012   Procedure: INSERTION OF DIALYSIS CATHETER;  Surgeon: Angelia Mould, MD;  Location: [facility];  Service: Vascular;  Laterality: N/A;  insertion of dialysis catheter right internal jugular vein  . 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 ORIF TIBIA FRACTURE Left 12/23/2018   Procedure: OPEN REDUCTION INTERNAL FIXATION (ORIF) TIBIA FRACTURE;  Surgeon: Shona Needles, MD;  Location: [facility];  Service: Orthopedics;  Laterality: Left;  . ORIF TIBIA PLATEAU Right 12/23/2018   Procedure: OPEN REDUCTION INTERNAL FIXATION (ORIF) TIBIAL PLATEAU;  Surgeon: Shona Needles, MD;  Location: [facility];  Service: Orthopedics;  Laterality: Right;  . TONSILLECTOMY  1967  . TUBAL LIGATION  2010     OB History   No obstetric history on file.  Home Medications    Prior to Admission medications   Medication Sig Start Date End Date Taking? Authorizing Provider  acetaminophen (TYLENOL) 500 MG tablet Take 1,000 mg by mouth every 6 (six) hours as needed for moderate pain.   Yes [provider]  acyclovir (ZOVIRAX) 400 MG tablet Take 400 mg by mouth 2 (two) times daily as needed (outbreaks).    Yes [provider]  albuterol (PROVENTIL HFA;VENTOLIN HFA) 108 (90 Base) MCG/ACT inhaler Inhale 2 puffs into the lungs every 4 (four) hours as needed for wheezing or shortness of breath. 07/16/16  Yes Ghimire, Henreitta Leber, MD  alendronate (FOSAMAX) 70 MG tablet Take 70 mg by mouth every 7 (seven) days. 05/11/19  Yes [provider]  allopurinol (ZYLOPRIM) 300 MG tablet Take 300 mg by mouth daily.   Yes [provider]  aspirin EC 81 MG tablet Take 81 mg by mouth daily.   Yes [provider]  brimonidine (ALPHAGAN P) 0.1 % SOLN Place 1 drop 2 (two) times daily into the left eye.   Yes [provider]  cholecalciferol (VITAMIN D3) 25 MCG (1000 UT) tablet Take 1,000 Units by mouth daily.    Yes [provider]  cycloSPORINE (RESTASIS) 0.05 % ophthalmic emulsion Place 1 drop into both eyes 2 (two) times daily.   Yes [provider]  FLUoxetine (PROZAC) 40 MG capsule Take 40 mg by mouth daily.   Yes [provider]  furosemide (LASIX) 40 MG tablet Take 1 tablet by mouth daily.  04/20/14  Yes [provider]  ipratropium-albuterol (DUONEB) 0.5-2.5 (3) MG/3ML SOLN Take 3 mLs by nebulization every 6 (six) hours as needed (shortness of breath).  03/11/19  Yes [provider]  latanoprost (XALATAN) 0.005 % ophthalmic solution Place 1 drop into both eyes at bedtime.   Yes [provider]  magnesium oxide (MAG-OX) 400 MG tablet Take 400 mg by mouth 2 (two) times daily.   Yes [provider]  mycophenolate (MYFORTIC) 180 MG EC tablet Take 180 mg by mouth 2 (two) times daily.   Yes [provider]  pantoprazole (PROTONIX) 40 MG tablet Take 40 mg by mouth daily.    Yes [provider]  prednisoLONE acetate (PRED FORTE) 1 % ophthalmic suspension Place 1 drop into both eyes See admin instructions. Instill 1 drop left eye four times daily and 1 drop right eye once daily.   Yes [provider]  predniSONE  (DELTASONE) 5 MG tablet Take 7.5 mg by mouth daily.    Yes [provider]  PROGRAF 1 MG capsule Take 1 mg by mouth daily. 03/22/19  Yes [provider]  SYMBICORT 80-4.5 MCG/ACT inhaler Inhale 2 puffs into the lungs 2 (two) times a day.  03/11/19  Yes [provider]  tacrolimus (PROGRAF) 0.5 MG capsule Take 0.5 mg by mouth at bedtime. Takes brand name Prograf   Yes [provider]  TRAVATAN Z 0.004 % SOLN ophthalmic solution Place 1 drop into both eyes at bedtime. 07/03/16  Yes [provider]  vitamin B-12 (CYANOCOBALAMIN) 100 MCG tablet Take 100 mcg by mouth daily.   Yes [provider]  Colchicine 0.6 MG CAPS Take 0.6 mg by mouth daily as needed (gout).     [provider]  dorzolamide (TRUSOPT) 2 % ophthalmic solution Place 1 drop into both eyes daily. 06/25/19   [provider]  HYDROcodone-acetaminophen (NORCO/VICODIN) 5-325 MG tablet Take 1-2 tablets by mouth every 4 (four)  hours as needed for moderate pain. Patient not taking: Reported on 06/29/2019 12/29/18   Marcell Anger, MD  ondansetron (ZOFRAN ODT) 4 MG disintegrating tablet 4mg  ODT q4 hours prn nausea/vomit Patient not taking: Reported on 06/29/2019 03/31/19   Drenda Freeze, MD    Family History Family History  Problem Relation Age of Onset  . Hypertension Mother   . Heart disease Father        CABG history  . Polycystic kidney disease Father   . Polycystic kidney disease Brother     Social History Social History   Tobacco Use  . Smoking status: Former Smoker    Packs/day: 0.25    Years: 15.00    Pack years: 3.75    Types: Cigarettes    Quit date: 03/22/1998    Years since quitting: 21.2  . Smokeless tobacco: Never Used  Substance Use Topics  . Alcohol use: No    Comment: social  . Drug use: No     Allergies   Infed [iron dextran], Pentamidine, Erythromycin [erythromycin], Oxycodone, Erythromycin, and Ultram [tramadol hcl]   Review  of Systems Review of Systems  All other systems reviewed and are negative.    Physical Exam Updated Vital Signs BP 136/72   Pulse 97   Temp 99.8 F (37.7 C) (Oral)   Resp (!) 23   SpO2 96%   Physical Exam Vitals signs and nursing note reviewed.  Constitutional:      General: She is not in acute distress.    Appearance: She is well-developed.  HENT:     Head: Normocephalic and atraumatic.  Eyes:     Conjunctiva/sclera: Conjunctivae normal.  Cardiovascular:     Rate and Rhythm: Regular rhythm. Tachycardia present.  Pulmonary:     Effort: Pulmonary effort is normal.     Comments: Breath sounds diminished bilaterally with faint crackles.  No wheezing appreciated.  O2 saturation is 91% on 3 L nasal cannula.  Normal respiratory effort. Abdominal:     General: Bowel sounds are normal.     Palpations: Abdomen is soft.     Tenderness: There is no abdominal tenderness. There is no guarding or rebound.  Musculoskeletal:     Comments: 2+ pedal edema bilaterally.  Skin:    General: Skin is warm.  Neurological:     Mental Status: She is alert.  Psychiatric:        Behavior: Behavior normal.      ED Treatments / Results  Labs (all labs ordered are listed, but only abnormal results are displayed) Labs Reviewed  BASIC METABOLIC PANEL - Abnormal; Notable for the following components:      Result Value   Sodium 133 (*)    Creatinine, Ser 1.14 (*)    GFR calc non Af Amer 51 (*)    GFR calc Af Amer 60 (*)    All other components within normal limits  CBC - Abnormal; Notable for the following components:   WBC 11.5 (*)    Hemoglobin 11.2 (*)    MCHC 28.9 (*)    RDW 18.7 (*)    All other components within normal limits  BRAIN NATRIURETIC PEPTIDE - Abnormal; Notable for the following components:   B Natriuretic Peptide 224.1 (*)    All other components within normal limits  SARS CORONAVIRUS 2 (HOSPITAL ORDER, [place] [facility])  COMPREHENSIVE METABOLIC  PANEL  CBC  TROPONIN I (HIGH SENSITIVITY)  TROPONIN I (HIGH SENSITIVITY)    EKG None  Radiology Dg Chest 2 View  Result Date: 06/29/2019 CLINICAL DATA:  Shortness of breath and chest tightness EXAM: CHEST - 2 VIEW COMPARISON:  Apr 19, 2019 FINDINGS: There is bibasilar atelectasis. There is no edema or consolidation. Heart is borderline prominent with mild pulmonary venous hypertension. No adenopathy. No bone lesions. IMPRESSION: There is a degree of pulmonary vascular congestion. Bibasilar atelectasis. No edema or consolidation. Electronically Signed   By: Lowella Grip III M.D.   On: 06/29/2019 14:41    Procedures Procedures (including critical care time)  Medications Ordered in ED Medications  enoxaparin (LOVENOX) injection 40 mg (has no administration in time range)  sodium chloride flush (NS) 0.9 % injection 3 mL (has no administration in time range)  sodium chloride flush (NS) 0.9 % injection 3 mL (has no administration in time range)  0.9 %  sodium chloride infusion (has no administration in time range)  acetaminophen (TYLENOL) tablet 650 mg (has no administration in time range)    Or  acetaminophen (TYLENOL) suppository 650 mg (has no administration in time range)  albuterol (VENTOLIN HFA) 108 (90 Base) MCG/ACT inhaler 8 puff (8 puffs Inhalation Given 06/29/19 1810)  AeroChamber Plus Flo-Vu Medium MISC 1 each (1 each Other Given 06/29/19 1834)  ondansetron (ZOFRAN) injection 4 mg (4 mg Intravenous Given 06/29/19 1808)  methylPREDNISolone sodium succinate (SOLU-MEDROL) 125 mg/2 mL injection 125 mg (125 mg Intravenous Given 06/29/19 2026)  acetaminophen (TYLENOL) tablet 650 mg (650 mg Oral Given 06/29/19 2032)     Initial Impression / Assessment and Plan / ED Course  I have reviewed the triage vital signs and the nursing notes.  Pertinent labs & imaging results that were available during my care of the patient were reviewed by me and considered in my medical decision making (see  chart for details).        Patient presenting with shortness of breath and chest tightness, which patient attributes to her asthma.  She states her symptoms feel very similar, not improved with her inhalers at home.  On arrival she is hypoxic 86% on room air, with some improvement on 3 L nasal cannula.  She is not normally on oxygen at home.  Lung sounds are diminished bilaterally normal effort.  Chest x-ray with pulmonary vascular congestion and bibasilar atelectasis, however no consolidation.  Troponins are normal.  She does have a mild white count and her BNP is unremarkable at 224.  COVID is negative.  She reports some mild improvement after albuterol inhaler, however without other findings on exam today, this is likely secondary to patient's asthma she says this feels very similar.  Do not feel the need to pursue PE at this time.  Consulted hospitalist for admission, Dr. Maudie Mercury accepting.  The patient appears reasonably stabilized for admission considering the current resources, flow, and capabilities available in the ED at this time, and I doubt any other [facility] requiring further screening and/or treatment in the ED prior to admission.\   Final Clinical Impressions(s) / ED Diagnoses   Final diagnoses:  Hypoxia  Exacerbation of asthma, unspecified asthma severity, unspecified whether persistent    ED Discharge Orders    None       Jenascia Bumpass, [place] N, PA-C 06/29/19 2211    Lucrezia Starch, MD 06/29/19 [phone]

## 2019-06-30 ENCOUNTER — Encounter (HOSPITAL_COMMUNITY): Payer: Self-pay | Admitting: *Deleted

## 2019-06-30 LAB — COMPREHENSIVE METABOLIC PANEL
ALT: 12 U/L (ref 0–44)
AST: 15 U/L (ref 15–41)
Albumin: 2.7 g/dL — ABNORMAL LOW (ref 3.5–5.0)
Alkaline Phosphatase: 85 U/L (ref 38–126)
Anion gap: 11 (ref 5–15)
BUN: 28 mg/dL — ABNORMAL HIGH (ref 8–23)
CO2: 22 mmol/L (ref 22–32)
Calcium: 9.5 mg/dL (ref 8.9–10.3)
Chloride: 102 mmol/L (ref 98–111)
Creatinine, Ser: 1.26 mg/dL — ABNORMAL HIGH (ref 0.44–1.00)
GFR calc Af Amer: 53 mL/min — ABNORMAL LOW (ref >60)
GFR calc non Af Amer: 46 mL/min — ABNORMAL LOW (ref >60)
Glucose, Bld: 145 mg/dL — ABNORMAL HIGH (ref 70–99)
Potassium: 5.3 mmol/L — ABNORMAL HIGH (ref 3.5–5.1)
Sodium: 135 mmol/L (ref 135–145)
Total Bilirubin: 2.2 mg/dL — ABNORMAL HIGH (ref 0.3–1.2)
Total Protein: 7.4 g/dL (ref 6.5–8.1)

## 2019-06-30 LAB — URINALYSIS, ROUTINE W REFLEX MICROSCOPIC
Bacteria, UA: NONE SEEN
Bilirubin Urine: NEGATIVE
Glucose, UA: NEGATIVE mg/dL
Ketones, ur: NEGATIVE mg/dL
Nitrite: NEGATIVE
Protein, ur: 30 mg/dL — AB
Specific Gravity, Urine: 1.013 (ref 1.005–1.030)
pH: 5 (ref 5.0–8.0)

## 2019-06-30 LAB — CBC
HCT: 39 % (ref 36.0–46.0)
Hemoglobin: 10.9 g/dL — ABNORMAL LOW (ref 12.0–15.0)
MCH: 27.3 pg (ref 26.0–34.0)
MCHC: 27.9 g/dL — ABNORMAL LOW (ref 30.0–36.0)
MCV: 97.5 fL (ref 80.0–100.0)
Platelets: 153 10*3/uL (ref 150–400)
RBC: 4 MIL/uL (ref 3.87–5.11)
RDW: 18.4 % — ABNORMAL HIGH (ref 11.5–15.5)
WBC: 8.7 10*3/uL (ref 4.0–10.5)
nRBC: 0 % (ref 0.0–0.2)

## 2019-06-30 MED ORDER — GUAIFENESIN ER 600 MG PO TB12
1200.0000 mg | ORAL_TABLET | Freq: Two times a day (BID) | ORAL | Status: DC
  Administered 2019-06-30 – 2019-07-02 (×5): 1200 mg via ORAL
  Filled 2019-06-30 (×5): qty 2

## 2019-06-30 MED ORDER — PREDNISOLONE ACETATE 1 % OP SUSP
1.0000 [drp] | Freq: Every day | OPHTHALMIC | Status: DC
  Administered 2019-07-01 – 2019-07-02 (×2): 1 [drp] via OPHTHALMIC

## 2019-06-30 NOTE — Progress Notes (Signed)
PROGRESS NOTE  April [name] IEP:[id number] DOB: 1956/03/28 DOA: 06/29/2019 PCP: [name], MD  HPI/Recap of past 24 hours: April [name]  is a 63 y.o. female,  W/ history of Gerd, polycystic kidney, ESRD s/p renal transplant , hyperlipidemia, chronic diastolic CHF, asthma, who presents with c/o dyspnea. Dyspnea since Monday.  Pt notes slight cough w yellow- green sputum.  Denies fever, chills, cp, palp, orthopnea, pnd, n/v, abd pain, diarrhea, brbpr, black stool, lower ext edema.   In ED,  T 99.8 P 107 R 24, Bp 149/67  Pox 86% on RA  Na 133, K 4.5, Bun 20, Creatinine 1.14 Wbc 11.5, Hgb 11.2, Plt 171 Trop 5  BNP 224.1 (prior 1,089.2)  06/30/19: Patient was seen and examined at her bedside this morning.  States cough is improved with current management.  Still dyspneic with minimal exertion.  Not on oxygen supplementation at baseline.  Assessment/Plan: Principal Problem:   Acute respiratory failure with hypoxia (HCC) Active Problems:   GERD (gastroesophageal reflux disease)   Hyperlipidemia   Depression   Chronic diastolic CHF (congestive heart failure) (HCC)   Asthma, chronic, unspecified asthma severity, with acute exacerbation   Hyponatremia  Acute hypoxic respiratory failure likely secondary to acute asthma exacerbation The source of asthma exacerbation is unclear She has responded well to IV Solu-Medrol, continue Continue bronchodilators Continue azithromycin 500 mg daily Continue diuretics Wean off oxygen supplementation as tolerated Home O2 evaluation for DC planning Maintain O2 saturation greater than 92% Advised to follow-up with pulmonology outpatient post discharge  Hyperkalemia possibly in the setting of albuterol Potassium 5.3 Repeat levels in the morning  Hyperbilirubinemia Unclear etiology T bili 222 LFTs normal Repeat levels in the morning  CKD 3 post renal transplant Renal function appears to be at baseline creatinine of 1.2 and GFR  46 Avoid nephrotoxins Continue immunosuppressants Repeat BMP in the morning  Post renal transplant on chronic immunosuppression Continue home medications  Chronic diastolic CHF Last 2D echo done on 04/21/2019 showed LVEF 55 to 60% Continue diuretics Continue strict I's and O's and daily weight  Physical debility Uses a walker noted to ambulate PT OT to assess For precautions  Chronic depression/anxiety Continue Prozac  GERD Continue PPI  Glaucoma Continue home medications  Gout Continue medications     Code Status: Full code  Family Communication: We will call if okay with the patient.  Disposition Plan: Home possibly tomorrow 07/01/2019 if symptomatology has improved and weaned off oxygen supplementation.   Consultants:  None  Procedures:  None  Antimicrobials:  Azithromycin for anti-inflammatory properties.  DVT prophylaxis: Subcu Lovenox daily   Objective: Vitals:   06/29/19 2319 06/30/19 0523 06/30/19 0803 06/30/19 1226  BP:  111/66  117/66  Pulse:  72  70  Resp:  16  20  Temp:  (!) 97.5 F (36.4 C)  97.7 F (36.5 C)  TempSrc:  Axillary  Oral  SpO2: 98% 100% 98% 97%  Weight:  78.8 kg    Height:  5\' 1"  (1.549 m)      Intake/Output Summary (Last 24 hours) at 06/30/2019 1328 Last data filed at 06/30/2019 1231 Gross per 24 hour  Intake 360 ml  Output -  Net 360 ml   Filed Weights   06/30/19 0523  Weight: 78.8 kg    Exam:  . General: 63 y.o. year-old female well developed well nourished in no acute distress.  Alert and oriented x3. . Cardiovascular: Regular rate and rhythm with no rubs or gallops.  No thyromegaly or JVD noted.   [name] Respiratory: Mild rales at bases no wheezes noted.  Poor inspiratory effort.  . Abdomen: Soft nontender nondistended with normal bowel sounds x4 quadrants. . Musculoskeletal: Trace lower extremity edema. 2/4 pulses in all 4 extremities. . Skin: Bruising noted in upper extremities and left lower  extremity. [name] Psychiatry: Mood is appropriate for condition and setting   Data Reviewed: CBC: Recent Labs  Lab 06/29/19 1419 06/30/19 0341  WBC 11.5* 8.7  HGB 11.2* 10.9*  HCT 38.8 39.0  MCV 96.5 97.5  PLT 171 588   Basic Metabolic Panel: Recent Labs  Lab 06/29/19 1419 06/30/19 0341  NA 133* 135  K 4.5 5.3*  CL 99 102  CO2 23 22  GLUCOSE 97 145*  BUN 20 28*  CREATININE 1.14* 1.26*  CALCIUM 9.4 9.5   GFR: Estimated Creatinine Clearance: 44 mL/min (A) (by C-G formula based on SCr of 1.26 mg/dL (H)). Liver Function Tests: Recent Labs  Lab 06/30/19 0341  AST 15  ALT 12  ALKPHOS 85  BILITOT 2.2*  PROT 7.4  ALBUMIN 2.7*   No results for input(s): LIPASE, AMYLASE in the last 168 hours. No results for input(s): AMMONIA in the last 168 hours. Coagulation Profile: No results for input(s): INR, PROTIME in the last 168 hours. Cardiac Enzymes: No results for input(s): CKTOTAL, CKMB, CKMBINDEX, TROPONINI in the last 168 hours. BNP (last 3 results) No results for input(s): PROBNP in the last 8760 hours. HbA1C: No results for input(s): HGBA1C in the last 72 hours. CBG: No results for input(s): GLUCAP in the last 168 hours. Lipid Profile: No results for input(s): CHOL, HDL, LDLCALC, TRIG, CHOLHDL, LDLDIRECT in the last 72 hours. Thyroid Function Tests: No results for input(s): TSH, T4TOTAL, FREET4, T3FREE, THYROIDAB in the last 72 hours. Anemia Panel: No results for input(s): VITAMINB12, FOLATE, FERRITIN, TIBC, IRON, RETICCTPCT in the last 72 hours. Urine analysis:    Component Value Date/Time   COLORURINE YELLOW 04/19/2019 1703   APPEARANCEUR CLEAR 04/19/2019 1703   LABSPEC 1.009 04/19/2019 1703   PHURINE 5.0 04/19/2019 1703   GLUCOSEU NEGATIVE 04/19/2019 1703   HGBUR MODERATE (A) 04/19/2019 1703   BILIRUBINUR NEGATIVE 04/19/2019 1703   KETONESUR NEGATIVE 04/19/2019 1703   PROTEINUR 100 (A) 04/19/2019 1703   UROBILINOGEN 0.2 05/15/2013 0733   NITRITE NEGATIVE  04/19/2019 1703   LEUKOCYTESUR MODERATE (A) 04/19/2019 1703   Sepsis Labs: @LABRCNTIP (procalcitonin:4,lacticidven:4)  ) Recent Results (from the past 240 hour(s))  SARS Coronavirus 2 [facility] order, Performed in [facility] hospital lab) Nasopharyngeal Nasopharyngeal Swab     Status: None   Collection Time: 06/29/19  5:44 PM   Specimen: Nasopharyngeal Swab  Result Value Ref Range Status   SARS Coronavirus 2 NEGATIVE NEGATIVE Final    Comment: (NOTE) If result is NEGATIVE SARS-CoV-2 target nucleic acids are NOT DETECTED. The SARS-CoV-2 RNA is generally detectable in upper and lower  respiratory specimens during the acute phase of infection. The lowest  concentration of SARS-CoV-2 viral copies this assay can detect is 250  copies / mL. A negative result does not preclude SARS-CoV-2 infection  and should not be used as the sole basis for treatment or other  patient management decisions.  A negative result may occur with  improper specimen collection / handling, submission of specimen other  than nasopharyngeal swab, presence of viral mutation(s) within the  areas targeted by this assay, and inadequate number of viral copies  (<250 copies / mL). A negative result must  be combined with clinical  observations, patient history, and epidemiological information. If result is POSITIVE SARS-CoV-2 target nucleic acids are DETECTED. The SARS-CoV-2 RNA is generally detectable in upper and lower  respiratory specimens dur ing the acute phase of infection.  Positive  results are indicative of active infection with SARS-CoV-2.  Clinical  correlation with patient history and other diagnostic information is  necessary to determine patient infection status.  Positive results do  not rule out bacterial infection or co-infection with other viruses. If result is PRESUMPTIVE POSTIVE SARS-CoV-2 nucleic acids MAY BE PRESENT.   A presumptive positive result was obtained on the submitted specimen  and  confirmed on repeat testing.  While 2019 novel coronavirus  (SARS-CoV-2) nucleic acids may be present in the submitted sample  additional confirmatory testing may be necessary for epidemiological  and / or clinical management purposes  to differentiate between  SARS-CoV-2 and other Sarbecovirus currently known to infect humans.  If clinically indicated additional testing with an alternate test  methodology [phone]) is advised. The SARS-CoV-2 RNA is generally  detectable in upper and lower respiratory sp ecimens during the acute  phase of infection. The expected result is Negative. Fact Sheet for Patients:  StrictlyIdeas.no Fact Sheet for Healthcare Providers: BankingDealers.co.za This test is not yet approved or cleared by the [place] FDA and has been authorized for detection and/or diagnosis of SARS-CoV-2 by FDA under an Emergency Use Authorization (EUA).  This EUA will remain in effect (meaning this test can be used) for the duration of the COVID-19 declaration under Section 564(b)(1) of the Act, 21 U.S.C. section 360bbb-3(b)(1), unless the authorization is terminated or revoked sooner. Performed at [facility], [place] [address], [place], [place] [address]       Studies: Dg Chest 2 View  Result Date: 06/29/2019 CLINICAL DATA:  Shortness of breath and chest tightness EXAM: CHEST - 2 VIEW COMPARISON:  Apr 19, 2019 FINDINGS: There is bibasilar atelectasis. There is no edema or consolidation. Heart is borderline prominent with mild pulmonary venous hypertension. No adenopathy. No bone lesions. IMPRESSION: There is a degree of pulmonary vascular congestion. Bibasilar atelectasis. No edema or consolidation. Electronically Signed   By: Lowella Grip III M.D.   On: 06/29/2019 14:41    Scheduled Meds: . albuterol  2.5 mg Nebulization TID  . allopurinol  300 mg Oral Daily  . aspirin EC  81 mg Oral Daily  .  brimonidine  1 drop Left Eye BID  . cholecalciferol  1,000 Units Oral Daily  . cycloSPORINE  1 drop Both Eyes BID  . dorzolamide  1 drop Both Eyes Daily  . enoxaparin (LOVENOX) injection  40 mg Subcutaneous QHS  . FLUoxetine  40 mg Oral Daily  . furosemide  40 mg Oral Daily  . latanoprost  1 drop Both Eyes QHS  . magnesium oxide  400 mg Oral BID  . methylPREDNISolone (SOLU-MEDROL) injection  80 mg Intravenous Q8H  . mometasone-formoterol  2 puff Inhalation BID  . mycophenolate  180 mg Oral BID  . pantoprazole  40 mg Oral Daily  . [START ON 07/01/2019] prednisoLONE acetate  1 drop Both Eyes Daily  . sodium chloride flush  3 mL Intravenous Q12H  . tacrolimus  0.5 mg Oral QHS  . tacrolimus  1 mg Oral Daily    Continuous Infusions: . sodium chloride    . doxycycline (VIBRAMYCIN) IV 100 mg (06/30/19 0955)     LOS: 1 day     Lorenda Cahill  [place] Crane, MD [facility] Pager [phone]  If 7PM-7AM, please contact night-coverage [email or web address] Password [email or web address] 06/30/2019, 1:28 PM

## 2019-06-30 NOTE — Progress Notes (Signed)
Patients daughter brought prograf medication to patient, but patient refuses to send to pharmacy, states med is very expensive and does not want to take any chance of losing it or having it left.

## 2019-07-01 LAB — BASIC METABOLIC PANEL
Anion gap: 9 (ref 5–15)
BUN: 46 mg/dL — ABNORMAL HIGH (ref 8–23)
CO2: 22 mmol/L (ref 22–32)
Calcium: 9.2 mg/dL (ref 8.9–10.3)
Chloride: 100 mmol/L (ref 98–111)
Creatinine, Ser: 1.29 mg/dL — ABNORMAL HIGH (ref 0.44–1.00)
GFR calc Af Amer: 51 mL/min — ABNORMAL LOW (ref >60)
GFR calc non Af Amer: 44 mL/min — ABNORMAL LOW (ref >60)
Glucose, Bld: 170 mg/dL — ABNORMAL HIGH (ref 70–99)
Potassium: 4.8 mmol/L (ref 3.5–5.1)
Sodium: 131 mmol/L — ABNORMAL LOW (ref 135–145)

## 2019-07-01 LAB — URINE CULTURE: Culture: 60000 — AB

## 2019-07-01 LAB — PROCALCITONIN: Procalcitonin: 0.38 ng/mL

## 2019-07-01 MED ORDER — ALBUTEROL SULFATE (2.5 MG/3ML) 0.083% IN NEBU
2.5000 mg | INHALATION_SOLUTION | Freq: Two times a day (BID) | RESPIRATORY_TRACT | Status: DC
  Administered 2019-07-01 – 2019-07-02 (×2): 2.5 mg via RESPIRATORY_TRACT
  Filled 2019-07-01: qty 3

## 2019-07-01 MED ORDER — METHYLPREDNISOLONE SODIUM SUCC 125 MG IJ SOLR
60.0000 mg | Freq: Two times a day (BID) | INTRAMUSCULAR | Status: DC
  Administered 2019-07-01 (×2): 60 mg via INTRAVENOUS
  Filled 2019-07-01 (×2): qty 2

## 2019-07-01 MED ORDER — SODIUM CHLORIDE 0.9 % IV SOLN
1.0000 g | INTRAVENOUS | Status: DC
  Administered 2019-07-01: 09:00:00 1 g via INTRAVENOUS
  Filled 2019-07-01: qty 10
  Filled 2019-07-01: qty 1
  Filled 2019-07-01: qty 10

## 2019-07-01 MED ORDER — SODIUM CHLORIDE 0.9 % IV SOLN: INTRAVENOUS | Status: DC

## 2019-07-01 NOTE — Progress Notes (Addendum)
Noticed pt's meds (proGraf) at bedside and pt has already refused to send meds to pharmacy from [facility] nurse. Pt is experiencing occasional light headedness and concerned about her safety. Health teaching provided regarding safe meds administration and pt verbalized understanding, agreed to send to pharmacy; count verified with patient;

## 2019-07-01 NOTE — Progress Notes (Addendum)
PROGRESS NOTE  April [name] TGY:[id number] DOB: Apr 04, 1956 DOA: 06/29/2019 PCP: [name], MD  HPI/Recap of past 24 hours: April [name]  is a 63 y.o. female,  W/ history of Gerd, polycystic kidney, ESRD s/p renal transplant , hyperlipidemia, chronic diastolic CHF, asthma, who presents with c/o dyspnea. Dyspnea since Monday.  Pt notes slight cough w yellow- green sputum.  Denies fever, chills, cp, palp, orthopnea, pnd, n/v, abd pain, diarrhea, brbpr, black stool, lower ext edema.   In ED,  T 99.8 P 107 R 24, Bp 149/67  Pox 86% on RA  Na 133, K 4.5, Bun 20, Creatinine 1.14 Wbc 11.5, Hgb 11.2, Plt 171 Trop 5  BNP 224.1 (prior 1,089.2)  07/01/19: Reports intermittent nausea and dizziness at rest.  UA done on 06/30/2019+ for pyuria in the setting of renal transplant and immunosuppression.  Urine culture in process.  Will treat empirically with Rocephin.  Assessment/Plan: Principal Problem:   Acute respiratory failure with hypoxia (HCC) Active Problems:   GERD (gastroesophageal reflux disease)   Hyperlipidemia   Depression   Chronic diastolic CHF (congestive heart failure) (HCC)   Asthma, chronic, unspecified asthma severity, with acute exacerbation   Hyponatremia  Resolving acute hypoxic respiratory failure likely secondary to acute asthma exacerbation The source of asthma exacerbation is unclear She has responded well to IV Solu-Medrol, continue Continue bronchodilators On p.o. Lasix 40 mg daily at home which is continued. Continue to wean off oxygen as tolerated Home O2 evaluation prior to discharge Reduce dose of IV Solu-Medrol down to 60 mg twice daily Advised to follow-up with pulmonology posthospitalization. O2 saturation 100% on 1 L  Intermittent dizziness States typically chronic for her but recently has been occurring at rest. Obtain orthostatic vital signs PT OT to assess Fall precautions Uses a walker at home for ambulation On p.o. Lasix which could  contribute if orthostasis is present  Suspected UTI with pyuria, POA UA done on 06/30/2019 positive for pyuria Procalcitonin 0.38 Urine culture in process Started on Rocephin empirically until urine culture results  Resolving hyperkalemia possibly in the setting of albuterol  Hyperbilirubinemia Unclear etiology T bili 222 LFTs normal Repeat levels in the morning  CKD 3 post renal transplant Renal function is at baseline creatinine of 1.2 and GFR 46 Avoid nephrotoxins Continue immunosuppressants Repeat BMP in the morning  Post renal transplant on chronic immunosuppression Continue home medications  Chronic diastolic CHF Last 2D echo done on 04/21/2019 showed LVEF 55 to 60% On 40 mg p.o. Lasix at home daily Continue strict I's and O's and daily weight  Physical debility Uses a walker at home to ambulate Pending PT OT assessment Fall precaution  Chronic depression/anxiety Continue Prozac  GERD Continue PPI  Glaucoma Continue home medications  Gout Continue medications     Code Status: Full code  Family Communication: We will call if okay with the patient.  Disposition Plan: Home possibly tomorrow with home health services 07/02/2019 after results of urine culture.  Consultants:  None  Procedures:  None  Antimicrobials:  Rocephin  DVT prophylaxis: Subcu Lovenox daily   Objective: Vitals:   06/30/19 2101 06/30/19 2144 07/01/19 0508 07/01/19 0857  BP: 125/66  (!) 101/59   Pulse: 79  68   Resp: 14  14   Temp: 98.2 F (36.8 C)  (!) 97.5 F (36.4 C)   TempSrc: Oral  Oral   SpO2: 97% 97% 100% 97%  Weight:      Height:  Intake/Output Summary (Last 24 hours) at 07/01/2019 0950 Last data filed at 07/01/2019 0254 Gross per 24 hour  Intake 1209.53 ml  Output 1700 ml  Net -490.47 ml   Filed Weights   06/30/19 0523  Weight: 78.8 kg    Exam:  . General: 63 y.o. year-old female well developed well nourished in no acute distress.  Alert  and oriented times 3. . Cardiovascular: Regular rate and rhythm no rubs or gallops no JVD or thyromegaly noted. [name] Respiratory: Clear to auscultation no wheezes or rales.  Poor inspiratory effort..  . Abdomen: Soft obese nontender nondistended normal bowel sounds present.  . Musculoskeletal: Trace lower extremity edema.  2 out of 4 pulses in all 4 extremities. . Skin: Bruising in upper extremities and left lower extremity. Psychiatry: Mood is appropriate for condition and setting.  Data Reviewed: CBC: Recent Labs  Lab 06/29/19 1419 06/30/19 0341  WBC 11.5* 8.7  HGB 11.2* 10.9*  HCT 38.8 39.0  MCV 96.5 97.5  PLT 171 314   Basic Metabolic Panel: Recent Labs  Lab 06/29/19 1419 06/30/19 0341 07/01/19 0400  NA 133* 135 131*  K 4.5 5.3* 4.8  CL 99 102 100  CO2 23 22 22   GLUCOSE 97 145* 170*  BUN 20 28* 46*  CREATININE 1.14* 1.26* 1.29*  CALCIUM 9.4 9.5 9.2   GFR: Estimated Creatinine Clearance: 43 mL/min (A) (by C-G formula based on SCr of 1.29 mg/dL (H)). Liver Function Tests: Recent Labs  Lab 06/30/19 0341  AST 15  ALT 12  ALKPHOS 85  BILITOT 2.2*  PROT 7.4  ALBUMIN 2.7*   No results for input(s): LIPASE, AMYLASE in the last 168 hours. No results for input(s): AMMONIA in the last 168 hours. Coagulation Profile: No results for input(s): INR, PROTIME in the last 168 hours. Cardiac Enzymes: No results for input(s): CKTOTAL, CKMB, CKMBINDEX, TROPONINI in the last 168 hours. BNP (last 3 results) No results for input(s): PROBNP in the last 8760 hours. HbA1C: No results for input(s): HGBA1C in the last 72 hours. CBG: No results for input(s): GLUCAP in the last 168 hours. Lipid Profile: No results for input(s): CHOL, HDL, LDLCALC, TRIG, CHOLHDL, LDLDIRECT in the last 72 hours. Thyroid Function Tests: No results for input(s): TSH, T4TOTAL, FREET4, T3FREE, THYROIDAB in the last 72 hours. Anemia Panel: No results for input(s): VITAMINB12, FOLATE, FERRITIN, TIBC, IRON,  RETICCTPCT in the last 72 hours. Urine analysis:    Component Value Date/Time   COLORURINE AMBER (A) 06/30/2019 1513   APPEARANCEUR HAZY (A) 06/30/2019 1513   LABSPEC 1.013 06/30/2019 1513   PHURINE 5.0 06/30/2019 1513   GLUCOSEU NEGATIVE 06/30/2019 1513   HGBUR SMALL (A) 06/30/2019 1513   BILIRUBINUR NEGATIVE 06/30/2019 1513   KETONESUR NEGATIVE 06/30/2019 1513   PROTEINUR 30 (A) 06/30/2019 1513   UROBILINOGEN 0.2 05/15/2013 0733   NITRITE NEGATIVE 06/30/2019 1513   LEUKOCYTESUR MODERATE (A) 06/30/2019 1513   Sepsis Labs: @LABRCNTIP (procalcitonin:4,lacticidven:4)  ) Recent Results (from the past 240 hour(s))  SARS Coronavirus 2 [facility] order, Performed in [facility] hospital lab) Nasopharyngeal Nasopharyngeal Swab     Status: None   Collection Time: 06/29/19  5:44 PM   Specimen: Nasopharyngeal Swab  Result Value Ref Range Status   SARS Coronavirus 2 NEGATIVE NEGATIVE Final    Comment: (NOTE) If result is NEGATIVE SARS-CoV-2 target nucleic acids are NOT DETECTED. The SARS-CoV-2 RNA is generally detectable in upper and lower  respiratory specimens during the acute phase of infection. The lowest  concentration  of SARS-CoV-2 viral copies this assay can detect is 250  copies / mL. A negative result does not preclude SARS-CoV-2 infection  and should not be used as the sole basis for treatment or other  patient management decisions.  A negative result may occur with  improper specimen collection / handling, submission of specimen other  than nasopharyngeal swab, presence of viral mutation(s) within the  areas targeted by this assay, and inadequate number of viral copies  (<250 copies / mL). A negative result must be combined with clinical  observations, patient history, and epidemiological information. If result is POSITIVE SARS-CoV-2 target nucleic acids are DETECTED. The SARS-CoV-2 RNA is generally detectable in upper and lower  respiratory specimens dur ing the acute  phase of infection.  Positive  results are indicative of active infection with SARS-CoV-2.  Clinical  correlation with patient history and other diagnostic information is  necessary to determine patient infection status.  Positive results do  not rule out bacterial infection or co-infection with other viruses. If result is PRESUMPTIVE POSTIVE SARS-CoV-2 nucleic acids MAY BE PRESENT.   A presumptive positive result was obtained on the submitted specimen  and confirmed on repeat testing.  While 2019 novel coronavirus  (SARS-CoV-2) nucleic acids may be present in the submitted sample  additional confirmatory testing may be necessary for epidemiological  and / or clinical management purposes  to differentiate between  SARS-CoV-2 and other Sarbecovirus currently known to infect humans.  If clinically indicated additional testing with an alternate test  methodology [phone]) is advised. The SARS-CoV-2 RNA is generally  detectable in upper and lower respiratory sp ecimens during the acute  phase of infection. The expected result is Negative. Fact Sheet for Patients:  StrictlyIdeas.no Fact Sheet for Healthcare Providers: BankingDealers.co.za This test is not yet approved or cleared by the [place] FDA and has been authorized for detection and/or diagnosis of SARS-CoV-2 by FDA under an Emergency Use Authorization (EUA).  This EUA will remain in effect (meaning this test can be used) for the duration of the COVID-19 declaration under Section 564(b)(1) of the Act, 21 U.S.C. section 360bbb-3(b)(1), unless the authorization is terminated or revoked sooner. Performed at [facility], [place] [address] [place], [place] [address]       Studies: No results found.  Scheduled Meds: . albuterol  2.5 mg Nebulization TID  . allopurinol  300 mg Oral Daily  . aspirin EC  81 mg Oral Daily  . brimonidine  1 drop Left Eye BID  .  cholecalciferol  1,000 Units Oral Daily  . cycloSPORINE  1 drop Both Eyes BID  . dorzolamide  1 drop Both Eyes Daily  . enoxaparin (LOVENOX) injection  40 mg Subcutaneous QHS  . FLUoxetine  40 mg Oral Daily  . furosemide  40 mg Oral Daily  . guaiFENesin  1,200 mg Oral BID  . latanoprost  1 drop Both Eyes QHS  . magnesium oxide  400 mg Oral BID  . methylPREDNISolone (SOLU-MEDROL) injection  60 mg Intravenous Q12H  . mometasone-formoterol  2 puff Inhalation BID  . mycophenolate  180 mg Oral BID  . pantoprazole  40 mg Oral Daily  . prednisoLONE acetate  1 drop Both Eyes Daily  . sodium chloride flush  3 mL Intravenous Q12H  . tacrolimus  0.5 mg Oral QHS  . tacrolimus  1 mg Oral Daily    Continuous Infusions: . sodium chloride    . cefTRIAXone (ROCEPHIN)  IV 1 g (07/01/19 0906)  LOS: 2 days     Kayleen Memos, MD [facility] Pager [phone]  If 7PM-7AM, please contact night-coverage [email or web address] Password [email or web address] 07/01/2019, 9:50 AM

## 2019-07-01 NOTE — Evaluation (Signed)
Physical Therapy Evaluation Patient Details Name: April [name] MRN: [id number] DOB: [birth year] Today's Date: 07/01/2019   History of Present Illness  63 y.o. female adm with c/o dyspnea   PMH: Gerd, polycystic kidney, ESRD s/p renal transplant, hyperlipidemia, chronic diastolic CHF, asthma .  Clinical Impression  Pt admitted with above diagnosis. Pt currently with functional limitations due to the deficits listed below (see PT Problem List).  Pt agreeable to short distance amb with PT, feeling generally unwell but willing to be OOB. Will continue to follow in acute setting. Recommend pt resume HHPT at d/c  Pt will benefit from skilled PT to increase their independence and safety with mobility to allow discharge to the venue listed below.       Follow Up Recommendations Home health PT(getting HHPT prior to adm)    Equipment Recommendations  None recommended by PT    Recommendations for Other Services       Precautions / Restrictions Precautions Precautions: Fall Restrictions Weight Bearing Restrictions: No      Mobility  Bed Mobility Overal bed mobility: Needs Assistance Bed Mobility: Supine to Sit     Supine to sit: Supervision;HOB elevated     General bed mobility comments: for safety, incr  time needed  Transfers Overall transfer level: Needs assistance Equipment used: Rolling walker (2 wheeled) Transfers: Sit to/from Stand Sit to Stand: Min guard;Min assist         General transfer comment: light assist to rise and steady, pt self cues for hand placement  Ambulation/Gait Ambulation/Gait assistance: Min assist;Min guard Gait Distance (Feet): 12 Feet(x2) Assistive device: Rolling walker (2 wheeled) Gait Pattern/deviations: Step-through pattern;Decreased stride length     General Gait Details: cues for RW distance and overall safety; SpO2= 92-95%  Stairs            Wheelchair Mobility    Modified Rankin (Stroke Patients Only)        Balance Overall balance assessment: Needs assistance         Standing balance support: During functional activity;Bilateral upper extremity supported Standing balance-Leahy Scale: Fair                               Pertinent Vitals/Pain Pain Assessment: 0-10 Pain Score: 2  Pain Location: knees (chronic per pt) Pain Descriptors / Indicators: Aching Pain Intervention(s): Limited activity within patient's tolerance;Monitored during session    Home Living Family/patient expects to be discharged to:: Private residence   Available Help at Discharge: Family;Available PRN/intermittently Type of Home: House Home Access: Ramped entrance     Home Layout: Two level;Able to live on main level with bedroom/bathroom Home Equipment: Gilford Rile - 2 wheels;Tub bench;Wheelchair - manual;Bedside commode      Prior Function Level of Independence: Independent with assistive device(s)         Comments: amb with RW at times     Hand Dominance        Extremity/Trunk Assessment   Upper Extremity Assessment Upper Extremity Assessment: Defer to OT evaluation    Lower Extremity Assessment Lower Extremity Assessment: Generalized weakness       Communication   Communication: No difficulties  Cognition Arousal/Alertness: Awake/alert Behavior During Therapy: WFL for tasks assessed/performed Overall Cognitive Status: Within Functional Limits for tasks assessed  General Comments      Exercises     Assessment/Plan    PT Assessment Patient needs continued PT services  PT Problem List Decreased strength;Decreased activity tolerance;Decreased mobility;Decreased balance       PT Treatment Interventions DME instruction;Therapeutic activities;Gait training;Functional mobility training;Therapeutic exercise;Patient/family education    PT Goals (Current goals can be found in the Care Plan section)  Acute Rehab PT  Goals PT Goal Formulation: With patient Time For Goal Achievement: 07/08/19 Potential to Achieve Goals: Good    Frequency Min 3X/week   Barriers to discharge        Co-evaluation               AM-PAC PT "6 Clicks" Mobility  Outcome Measure Help needed turning from your back to your side while in a flat bed without using bedrails?: A Little Help needed moving from lying on your back to sitting on the side of a flat bed without using bedrails?: A Little Help needed moving to and from a bed to a chair (including a wheelchair)?: A Little   Help needed to walk in hospital room?: A Little Help needed climbing 3-5 steps with a railing? : A Little 6 Click Score: 15    End of Session Equipment Utilized During Treatment: Gait belt Activity Tolerance: Patient tolerated treatment well Patient left: with call bell/phone within reach;in chair   PT Visit Diagnosis: Unsteadiness on feet (R26.81)    Time: 2353-6144 PT Time Calculation (min) (ACUTE ONLY): 20 min   Charges:   PT Evaluation $PT Eval Low Complexity: 1 Low          Kenyon Ana, PT  Pager: [phone] Acute Rehab Dept [facility]): [phone]   07/01/2019   [facility] 07/01/2019, 12:57 PM

## 2019-07-02 MED ORDER — PREDNISONE 20 MG PO TABS: 40.0000 mg | ORAL_TABLET | Freq: Every day | ORAL | 0 refills | Status: AC

## 2019-07-02 MED ORDER — AMOXICILLIN-POT CLAVULANATE 875-125 MG PO TABS: 1.0000 | ORAL_TABLET | Freq: Two times a day (BID) | ORAL | 0 refills | Status: AC

## 2019-07-02 MED ORDER — PREDNISONE 20 MG PO TABS
40.0000 mg | ORAL_TABLET | Freq: Every day | ORAL | Status: DC
  Administered 2019-07-02: 07:00:00 40 mg via ORAL
  Filled 2019-07-02: qty 2

## 2019-07-02 MED ORDER — AMOXICILLIN-POT CLAVULANATE 875-125 MG PO TABS
1.0000 | ORAL_TABLET | Freq: Two times a day (BID) | ORAL | Status: DC
  Administered 2019-07-02: 07:00:00 1 via ORAL
  Filled 2019-07-02: qty 1

## 2019-07-02 MED ORDER — ALBUTEROL SULFATE (2.5 MG/3ML) 0.083% IN NEBU: 2.5000 mg | INHALATION_SOLUTION | Freq: Three times a day (TID) | RESPIRATORY_TRACT | 0 refills | Status: DC | PRN

## 2019-07-02 MED ORDER — AMOXICILLIN-POT CLAVULANATE 875-125 MG PO TABS: 1.0000 | ORAL_TABLET | Freq: Two times a day (BID) | ORAL | Status: DC

## 2019-07-02 MED ORDER — AMOXICILLIN-POT CLAVULANATE 875-125 MG PO TABS: 1.0000 | ORAL_TABLET | Freq: Two times a day (BID) | ORAL | 0 refills | Status: DC

## 2019-07-02 NOTE — Progress Notes (Signed)
Home health agencies that serve [phone].        [place] Quality of Patient Care Rating Patient Survey Summary Rating  [facility] [phone] 3 out of 5 stars 5 out of [place] [phone] 3 out of 5 stars 4 out of [place] [phone] 4 out of 5 stars 4 out of [place] [phone] 4  out of 5 stars 3 out of [employer] [phone] 4 out of 5 stars 4 out of [place] [phone] 4 out of 5 stars 4 out of [place] [phone]  out of 5 stars 4 out of [place] [phone] 4 out of 5 stars 4 out of 5 stars  [facility] [phone] 3  out of 5 stars 4 out of [place] [phone] 3 out of 5 stars 4 out of 5 stars  [facility] [phone] 4 out of 5 stars Not Available12  [facility] [phone] 3  out of 5 stars 3 out of 5 stars  [facility] [phone] 3  out of 5 stars 4 out of [place] [phone]  out of 5 stars 4 out of [place] [phone] 3  out of 5 stars 3 out of [place] [phone]  out of 5 stars Not [place] [phone] 3  out of 5 stars 4 out of [place] [phone] 4  out of 5 stars 3 out of [place] [phone]  out of 5 stars 2 out of [place] number Footnote as displayed on [place]  1 This agency provides services under a federal waiver program to non-traditional, chronic long term population.  2 This agency provides services to a  special needs population.  3 Not Available.  4 The number of patient episodes for this measure is too small to report.  5 This measure currently does not have data or provider has been certified/recertified for less than 6 months.  6 The national average for this measure is not provided because of state-to-state differences in data collection.  7 Medicare is not displaying rates for this measure for any home health agency, because of an issue with the data.  8 There were problems with the data and they are being corrected.  9 Zero, or very few, patients met the survey's rules for inclusion. The scores shown, if any, reflect a very small number of surveys and may not accurately tell how an agency is doing.  10 Survey results are based on less than 12 months of data.  11 Fewer than 70 patients completed the survey. Use the scores shown, if any, with caution as the number of surveys may be too low to accurately tell how an agency is doing.  12 No survey results are available for this period.  13 Data suppressed by CMS  for one or more quarters.

## 2019-07-02 NOTE — Discharge Summary (Signed)
Discharge Summary  April [name] HAL:[id number] DOB: [birth year]  PCP: [name], MD  Admit date: 06/29/2019 Discharge date: 07/02/2019  Time spent: 35 minutes  Recommendations for Outpatient Follow-up:  1. Follow-up with your primary care provider 2. Please obtain pulmonology referral from your primary care provider. 3. Follow-up with your nephrologist 4. Continue physical therapy 5. Take your medications as prescribed 6. Fall precautions.  Discharge Diagnoses:  Active Hospital Problems   Diagnosis Date Noted  . Acute respiratory failure with hypoxia ([place]) 04/19/2019  . Hyponatremia 06/29/2019  . Asthma, chronic, unspecified asthma severity, with acute exacerbation 10/03/2018  . Chronic diastolic CHF (congestive heart failure) ([place]) 10/01/2017  . GERD (gastroesophageal reflux disease) 07/15/2016  . Hyperlipidemia 07/15/2016  . Depression 07/15/2016    Resolved Hospital Problems  No resolved problems to display.    Discharge Condition: Stable  Diet recommendation: Resume previous diet.  Vitals:   07/02/19 0607 07/02/19 0804  BP: 111/70   Pulse: 75   Resp: 20   Temp: 97.6 F (36.4 C)   SpO2: 95% 97%    History of present illness:  [name] avery pleasant 63 y.o.female,W/ history of Gerd, polycystic kidney disease, ESRD s/p renal transplant, hyperlipidemia, chronic diastolic CHF, asthma, who presents with c/o dyspnea. Associated with slight cough with yellow- green sputum. Admitted for acute asthma exacerbation.  Hospital course complicated by group B strep UTI, initially treated with Rocephin then switched to Augmentin.  07/02/19: Patient was seen and examined at her bedside.  O2 saturation much improved, 97% on room air.  No acute issues.    All vital signs and labs were reviewed.  Patient is stable for discharge to home.  Will need to continue physical therapy and take medications as prescribed.  Fall precautions.    Hospital Course:   Principal Problem:   Acute respiratory failure with hypoxia (HCC) Active Problems:   GERD (gastroesophageal reflux disease)   Hyperlipidemia   Depression   Chronic diastolic CHF (congestive heart failure) (HCC)   Asthma, chronic, unspecified asthma severity, with acute exacerbation   Hyponatremia  Resolved acute hypoxic respiratory failure secondary to acute asthma exacerbation The source of asthma exacerbation is unclear She has responded well to to steroids and bronchodilators.   Patient would benefit from pulmonology referral from PCP On p.o. Lasix 40 mg daily at home which is continued. Continue prednisone 40 mg daily x5 days then resume your home dose regimen of prednisone as recommended by your nephrologist or transplant team for your renal transplant. O2 saturation 97% on room air on 07/02/2019.  Resolved dizziness likely from orthostasis Positive orthostatics on 07/01/2019. Continue physical therapy Euvolemic on home p.o. Lasix 40 mg daily Avoid dehydration Continue fall precautions  Group B strep UTI in the setting of chronic immunosuppression, poa UA done on 06/30/2019 positive for pyuria Urine culture grew 60,000 colonies of group B strep Started on Augmentin 07/02/19  825 mg/125 mg x 7 days Completed 1 day of Rocephin  Resolving hyperkalemia possibly in the setting of albuterol  Hyperbilirubinemia Unclear etiology T bili 2.2 LFTs normal Follow-up with your renal transplant team.  CKD 3 post renal transplant Renal function is at baseline creatinine of 1.2 and GFR 44 Continue to avoid nephrotoxins Follow-up with your renal transplant team.  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 Lasix at home daily Continue strict I's and O's and daily weight  Continue low-salt diet  Physical debility post surgery Uses a walker at home to  ambulate PT recommended home health PT Continue physical therapy Fall precautions  Chronic depression/anxiety Continue Prozac  GERD Continue PPI  Glaucoma Continue home medications  Gout Continue home medications     Code Status: Full code    Consultants:  None  Procedures:  None  Antimicrobials:  Rocephin  Augmentin    Discharge Exam: BP 111/70 (BP Location: Right Arm)   Pulse 75   Temp 97.6 F (36.4 C) (Oral)   Resp 20   Ht 5\' 1"  (1.549 m)   Wt 78.8 kg   SpO2 97%   BMI 32.82 kg/m  . General: 63 y.o. year-old female well developed well nourished in no acute distress.  Alert and oriented x3. . Cardiovascular: Regular rate and rhythm with no rubs or gallops.  No thyromegaly or JVD noted.   [name] Respiratory: Clear to auscultation with no wheezes or rales. Good inspiratory effort. . Abdomen: Soft nontender nondistended with normal bowel sounds x4 quadrants. . Musculoskeletal: No lower extremity edema. 2/4 pulses in all 4 extremities. [name] Psychiatry: Mood is appropriate for condition and setting  Discharge Instructions You were cared for by a hospitalist during your hospital stay. If you have any questions about your discharge medications or the care you received while you were in the hospital after you are discharged, you can call the unit and asked to speak with the hospitalist on call if the hospitalist that took care of you is not available. Once you are discharged, your primary care physician will handle any further medical issues. Please note that NO REFILLS for any discharge medications will be authorized once you are discharged, as it is imperative that you return to your primary care physician (or establish a relationship with a primary care physician if you do not have one) for your aftercare needs so that they can reassess your need for medications and monitor your lab values.   Allergies as of 07/02/2019      Reactions   Infed [iron  Dextran] Other (See Comments)   Chest tightness   Pentamidine Itching, Shortness Of Breath, Swelling   Erythromycin [erythromycin] Other (See Comments)   Mouth Ulcers   Oxycodone Nausea Only, Nausea And Vomiting   Erythromycin Rash   Causes breakout in mouth   Ultram [tramadol Hcl] Anxiety      Medication List    STOP taking these medications   acetaminophen 500 MG tablet Commonly known as: TYLENOL   HYDROcodone-acetaminophen 5-325 MG tablet Commonly known as: NORCO/VICODIN   ipratropium-albuterol 0.5-2.5 (3) MG/3ML Soln Commonly known as: DUONEB   ondansetron 4 MG disintegrating tablet Commonly known as: Zofran ODT     TAKE these medications   acyclovir 400 MG tablet Commonly known as: ZOVIRAX Take 400 mg by mouth 2 (two) times daily as needed (outbreaks).   albuterol 108 (90 Base) MCG/ACT inhaler Commonly known as: VENTOLIN HFA Inhale 2 puffs into the lungs every 4 (four) hours as needed for wheezing or shortness of breath. What changed: Another medication with the same name was added. Make sure you understand how and when to take each.   albuterol (2.5 MG/3ML) 0.083% nebulizer solution Commonly known as: PROVENTIL Take 3 mLs (2.5 mg total) by nebulization 3 (three) times daily as needed for wheezing or shortness of breath. What changed: You were already taking a medication with the same name, and this prescription was added. Make sure you understand how and when  to take each.   alendronate 70 MG tablet Commonly known as: FOSAMAX Take 70 mg by mouth every 7 (seven) days.   allopurinol 300 MG tablet Commonly known as: ZYLOPRIM Take 300 mg by mouth daily.   amoxicillin-clavulanate 875-125 MG tablet Commonly known as: AUGMENTIN Take 1 tablet by mouth every 12 (twelve) hours for 7 days.   aspirin EC 81 MG tablet Take 81 mg by mouth daily.   brimonidine 0.1 % Soln Commonly known as: ALPHAGAN P Place 1 drop 2 (two) times daily into the left eye.    cholecalciferol 25 MCG (1000 UT) tablet Commonly known as: VITAMIN D3 Take 1,000 Units by mouth daily.   Colchicine 0.6 MG Caps Take 0.6 mg by mouth daily as needed (gout).   cycloSPORINE 0.05 % ophthalmic emulsion Commonly known as: RESTASIS Place 1 drop into both eyes 2 (two) times daily.   dorzolamide 2 % ophthalmic solution Commonly known as: TRUSOPT Place 1 drop into both eyes daily.   FLUoxetine 40 MG capsule Commonly known as: PROZAC Take 40 mg by mouth daily.   furosemide 40 MG tablet Commonly known as: LASIX Take 1 tablet by mouth daily.   latanoprost 0.005 % ophthalmic solution Commonly known as: XALATAN Place 1 drop into both eyes at bedtime.   magnesium oxide 400 MG tablet Commonly known as: MAG-OX Take 400 mg by mouth 2 (two) times daily.   mycophenolate 180 MG EC tablet Commonly known as: MYFORTIC Take 180 mg by mouth 2 (two) times daily.   pantoprazole 40 MG tablet Commonly known as: PROTONIX Take 40 mg by mouth daily.   prednisoLONE acetate 1 % ophthalmic suspension Commonly known as: PRED FORTE Place 1 drop into both eyes See admin instructions. Instill 1 drop left eye four times daily and 1 drop right eye once daily.   predniSONE 5 MG tablet Commonly known as: DELTASONE Take 7.5 mg by mouth daily. What changed: Another medication with the same name was added. Make sure you understand how and when to take each.   predniSONE 20 MG tablet Commonly known as: DELTASONE Take 2 tablets (40 mg total) by mouth daily with breakfast for 5 days. Start taking on: July 03, 2019 What changed: You were already taking a medication with the same name, and this prescription was added. Make sure you understand how and when to take each.   Symbicort 80-4.5 MCG/ACT inhaler Generic drug: budesonide-formoterol Inhale 2 puffs into the lungs 2 (two) times a day.   tacrolimus 0.5 MG capsule Commonly known as: PROGRAF Take 0.5 mg by mouth at bedtime. Takes brand  name Prograf   Prograf 1 MG capsule Generic drug: tacrolimus Take 1 mg by mouth daily.   Travatan Z 0.004 % Soln ophthalmic solution Generic drug: Travoprost (BAK Free) Place 1 drop into both eyes at bedtime.   vitamin B-12 100 MCG tablet Commonly known as: CYANOCOBALAMIN Take 100 mcg by mouth daily.      Allergies  Allergen Reactions  . Infed [Iron Dextran] Other (See Comments)    Chest tightness  . Pentamidine Itching, Shortness Of Breath and Swelling  . Erythromycin [Erythromycin] Other (See Comments)    Mouth Ulcers  . Oxycodone Nausea Only and Nausea And Vomiting  . Erythromycin Rash    Causes breakout in mouth  . Ultram [Tramadol Hcl] Anxiety   Follow-up Information    [name], MD. Call in 1 day(s).   Specialty: Family Medicine Why: Please call for a post hospital follow-up appointment. Contact  information: [place] [address] [phone]        Mauricia Area, MD. Call in 1 day(s).   Specialty: Nephrology Why: Please call for a post hospital follow-up appointment. Contact information: [place] [address] [phone]            The results of significant diagnostics from this hospitalization (including imaging, microbiology, ancillary and laboratory) are listed below for reference.    Significant Diagnostic Studies: Dg Chest 2 View  Result Date: 06/29/2019 CLINICAL DATA:  Shortness of breath and chest tightness EXAM: CHEST - 2 VIEW COMPARISON:  Apr 19, 2019 FINDINGS: There is bibasilar atelectasis. There is no edema or consolidation. Heart is borderline prominent with mild pulmonary venous hypertension. No adenopathy. No bone lesions. IMPRESSION: There is a degree of pulmonary vascular congestion. Bibasilar atelectasis. No edema or consolidation. Electronically Signed   By: Lowella Grip III M.D.   On: 06/29/2019 14:41    Microbiology: Recent Results (from the past 240 hour(s))  SARS Coronavirus 2  [facility] order, Performed in [facility] hospital lab) Nasopharyngeal Nasopharyngeal Swab     Status: None   Collection Time: 06/29/19  5:44 PM   Specimen: Nasopharyngeal Swab  Result Value Ref Range Status   SARS Coronavirus 2 NEGATIVE NEGATIVE Final    Comment: (NOTE) If result is NEGATIVE SARS-CoV-2 target nucleic acids are NOT DETECTED. The SARS-CoV-2 RNA is generally detectable in upper and lower  respiratory specimens during the acute phase of infection. The lowest  concentration of SARS-CoV-2 viral copies this assay can detect is 250  copies / mL. A negative result does not preclude SARS-CoV-2 infection  and should not be used as the sole basis for treatment or other  patient management decisions.  A negative result may occur with  improper specimen collection / handling, submission of specimen other  than nasopharyngeal swab, presence of viral mutation(s) within the  areas targeted by this assay, and inadequate number of viral copies  (<250 copies / mL). A negative result must be combined with clinical  observations, patient history, and epidemiological information. If result is POSITIVE SARS-CoV-2 target nucleic acids are DETECTED. The SARS-CoV-2 RNA is generally detectable in upper and lower  respiratory specimens dur ing the acute phase of infection.  Positive  results are indicative of active infection with SARS-CoV-2.  Clinical  correlation with patient history and other diagnostic information is  necessary to determine patient infection status.  Positive results do  not rule out bacterial infection or co-infection with other viruses. If result is PRESUMPTIVE POSTIVE SARS-CoV-2 nucleic acids MAY BE PRESENT.   A presumptive positive result was obtained on the submitted specimen  and confirmed on repeat testing.  While 2019 novel coronavirus  (SARS-CoV-2) nucleic acids may be present in the submitted sample  additional confirmatory testing may be necessary for  epidemiological  and / or clinical management purposes  to differentiate between  SARS-CoV-2 and other Sarbecovirus currently known to infect humans.  If clinically indicated additional testing with an alternate test  methodology [phone]) is advised. The SARS-CoV-2 RNA is generally  detectable in upper and lower respiratory sp ecimens during the acute  phase of infection. The expected result is Negative. Fact Sheet for Patients:  StrictlyIdeas.no Fact Sheet for Healthcare Providers: BankingDealers.co.za This test is not yet approved or cleared by the [place] FDA and has been authorized for detection and/or diagnosis of SARS-CoV-2 by FDA under an Emergency Use Authorization (EUA).  This EUA will  remain in effect (meaning this test can be used) for the duration of the COVID-19 declaration under Section 564(b)(1) of the Act, 21 U.S.C. section 360bbb-3(b)(1), unless the authorization is terminated or revoked sooner. Performed at [facility], [place] [address], [place], [place] [address]   Culture, Urine     Status: Abnormal   Collection Time: 06/30/19  3:13 PM   Specimen: Urine, Clean Catch  Result Value Ref Range Status   Specimen Description   Final    URINE, CLEAN CATCH Performed at [facility], [place] [address] [place], [place] [address]    Special Requests   Final    NONE Performed at [facility], [place] [address], [place], [place] [address]    Culture (A)  Final    60,000 COLONIES/mL GROUP B STREP(S.AGALACTIAE)ISOLATED TESTING AGAINST S. AGALACTIAE NOT ROUTINELY PERFORMED DUE TO PREDICTABILITY OF AMP/PEN/VAN SUSCEPTIBILITY. Performed at [facility], [place] [address] [place], [place] [address]    Report Status 07/01/2019 FINAL  Final     Labs: Basic Metabolic Panel: Recent Labs  Lab 06/29/19 1419 06/30/19 0341 07/01/19 0400  NA 133* 135 131*  K 4.5 5.3*  4.8  CL 99 102 100  CO2 23 22 22   GLUCOSE 97 145* 170*  BUN 20 28* 46*  CREATININE 1.14* 1.26* 1.29*  CALCIUM 9.4 9.5 9.2   Liver Function Tests: Recent Labs  Lab 06/30/19 0341  AST 15  ALT 12  ALKPHOS 85  BILITOT 2.2*  PROT 7.4  ALBUMIN 2.7*   No results for input(s): LIPASE, AMYLASE in the last 168 hours. No results for input(s): AMMONIA in the last 168 hours. CBC: Recent Labs  Lab 06/29/19 1419 06/30/19 0341  WBC 11.5* 8.7  HGB 11.2* 10.9*  HCT 38.8 39.0  MCV 96.5 97.5  PLT 171 153   Cardiac Enzymes: No results for input(s): CKTOTAL, CKMB, CKMBINDEX, TROPONINI in the last 168 hours. BNP: BNP (last 3 results) Recent Labs    10/02/18 1930 04/19/19 1244 06/29/19 1419  BNP 309.1* 1,089.2* 224.1*    ProBNP (last 3 results) No results for input(s): PROBNP in the last 8760 hours.  CBG: No results for input(s): GLUCAP in the last 168 hours.     Signed:  Kayleen Memos, MD [facility] 07/02/2019, 9:32 AM

## 2019-07-02 NOTE — Discharge Instructions (Addendum)
Immunosuppression Immunosuppression uses medicines to lower your immune response. Your immune response is your body's natural way of defending itself against something new or unknown that enters your body, such as bacteria or a virus. Why am I receiving immunosuppression? You may be receiving immunosuppression:  To treat an autoimmune disorder. Autoimmune disorders cause your immune response to attack your body.  To keep your body from rejecting a transplant of cells, tissues, or organs that you received from a donor. When you receive a transplant, your body knows that there is something new and unknown in your body. Sometimes this triggers the immune response to attack the transplant.  To prevent inflammation that is caused by a long-term (chronic) condition, such as asthma or chronic obstructive pulmonary disease (COPD). Certain types of chronic conditions are related to severe immune responses. These responses can cause inflammation that can lead to life-threatening situations. What are the side effects of immunosuppression? The main side effect of immunosuppression is a higher risk of infection. Because immunosuppression lowers your body's ability to defend itself, you should call your health care provider if you have any symptoms of infection, such as:  A fever.  Fluid or a bad smell coming from a surgical scar.  A burning feeling when you pass urine.  A cold or cough that will not go away.  Body aches. Other side effects of immunosuppression include:  Nausea and vomiting.  Loss of appetite.  Increased hair growth.  Shaky hands (hand tremor). These side effects typically go away as your body gets used to the medicine. They may also go away or get better if your health care provider changes your dosage. Why is it important for me to take my medicine exactly as instructed?  For immunosuppression to work, your body needs to have just the right amount of medicine all the time. Your  health care provider will tell you exactly how much medicine to take and exactly when you need to take it. It is very important to follow instructions from your health care provider. Missing even a single dose can cause your condition to get worse. If you forget to take your medicine, take it as soon as you remember and call your health care provider right away. But, if you forget to take your medicine and it is time to take it again, do not take two doses. What are some tips for remembering to take my medicine? Here are some tips to remind you to take your medicine:  Organize your daily medicines using one of these tools: ? Pill organizer. ? Written chart from your health care provider. ? Notebook. ? Binder. ? Your own calendar.  Use your tool to help you keep track of the: ? Name of the medicine and the dosage. ? Days to take the medicine(s). ? Time of day to take the medicine(s).  Set cues or reminders for taking your medicine(s), such as using a clock or cell phone alarm.  Review your medicine schedule with family members or friends. They can help remind you when to take your medicine(s) and how much to take. This information is not intended to replace advice given to you by your health care provider. Make sure you discuss any questions you have with your health care provider. Document Released: 11/14/2013 Document Revised: 03/03/2019 Document Reviewed: 08/10/2016 Elsevier Patient Education  [place] Prevention, Adult Although you may not be able to control the fact that you have asthma, you can take actions to prevent  episodes of asthma (asthma attacks). These actions include:  Creating a written plan for managing and treating your asthma attacks (asthma action plan).  Monitoring your asthma.  Avoiding things that can irritate your airways or make your asthma symptoms worse (asthma triggers).  Taking your medicines as directed.  Acting quickly if you  have signs or symptoms of an asthma attack. What are some ways to prevent an asthma attack? Create a plan Work with your health care provider to create an asthma action plan. This plan should include:  A list of your asthma triggers and how to avoid them.  A list of symptoms that you experience during an asthma attack.  Information about when to take medicine and how much medicine to take.  Information to help you understand your peak flow measurements.  Contact information for your health care providers.  Daily actions that you can take to control asthma. Monitor your asthma To monitor your asthma:  Use your peak flow meter every morning and every evening for 2-3 weeks. Record the results in a journal. A drop in your peak flow numbers on one or more days may mean that you are starting to have an asthma attack, even if you are not having symptoms.  When you have asthma symptoms, write them down in a journal.  Avoid asthma triggers Work with your health care provider to find out what your asthma triggers are. This can be done by:  Being tested for allergies.  Keeping a journal that notes when asthma attacks occur and what may have contributed to them.  Asking your health care provider whether other medical conditions make your asthma worse. Common asthma triggers include:  Dust.  Smoke. This includes campfire smoke and secondhand smoke from tobacco products.  Pet dander.  Trees, grasses or pollens.  Very cold, dry, or humid air.  Mold.  Foods that contain high amounts of sulfites.  Strong smells.  Engine exhaust and air pollution.  Aerosol sprays and fumes from household cleaners.  Household pests and their droppings, including dust mites and cockroaches.  Certain medicines, including NSAIDs. Once you have determined your asthma triggers, take steps to avoid them. Depending on your triggers, you may be able to reduce the chance of an asthma attack by:  Keeping  your home clean. Have someone dust and vacuum your home for you 1 or 2 times a week. If possible, have them use a high-efficiency particulate arrestance (HEPA) vacuum.  Washing your sheets weekly in hot water.  Using allergy-proof mattress covers and casings on your bed.  Keeping pets out of your home.  Taking care of mold and water problems in your home.  Avoiding areas where people smoke.  Avoiding using strong perfumes or odor sprays.  Avoid spending a lot of time outdoors when pollen counts are high and on very windy days.  Talking with your health care provider before stopping or starting any new medicines. Medicines Take over-the-counter and prescription medicines only as told by your health care provider. Many asthma attacks can be prevented by carefully following your medicine schedule. Taking your medicines correctly is especially important when you cannot avoid certain asthma triggers. Even if you are doing well, do not stop taking your medicine and do not take less medicine. Act quickly If an asthma attack happens, acting quickly can decrease how severe it is and how long it lasts. Take these actions:  Pay attention to your symptoms. If you are coughing, wheezing, or having difficulty breathing, do  not wait to see if your symptoms go away on their own. Follow your asthma action plan.  If you have followed your asthma action plan and your symptoms are not improving, call your health care provider or seek immediate medical care at the nearest hospital. It is important to write down how often you need to use your fast-acting rescue inhaler. You can track how often you use an inhaler in your journal. If you are using your rescue inhaler more often, it may mean that your asthma is not under control. Adjusting your asthma treatment plan may help you to prevent future asthma attacks and help you to gain better control of your condition. How can I prevent an asthma attack when I  exercise? Exercise is a common asthma trigger. To prevent asthma attacks during exercise:  Follow advice from your health care provider about whether you should use your fast-acting inhaler before exercising. Many people with asthma experience exercise-induced bronchoconstriction (EIB). This condition often worsens during vigorous exercise in cold, humid, or dry environments. Usually, people with EIB can stay very active by using a fast-acting inhaler before exercising.  Avoid exercising outdoors in very cold or humid weather.  Avoid exercising outdoors when pollen counts are high.  Warm up and cool down when exercising.  Stop exercising right away if asthma symptoms start. Consider taking part in exercises that are less likely to cause asthma symptoms such as:  Indoor swimming.  Biking.  Walking.  Hiking.  Playing football. This information is not intended to replace advice given to you by your health care provider. Make sure you discuss any questions you have with your health care provider. Document Released: 10/28/2009 Document Revised: 10/22/2017 Document Reviewed: 04/25/2016 Elsevier Patient Education  2020 [place].   Asthma and Physical Activity Physical activity is an important part of a healthy lifestyle. If you have asthma, it is important to exercise because physical activity can help you to:  Control your asthma.  Maintain your weight or lose weight.  Increase your energy.  Decrease stress and anxiety.  Lower your risk of getting sick.  Improve your heart health. However, asthma symptoms can flare up when you are physically active or exercising. You can learn how to control your asthma and prevent symptoms during exercise. This will help you to remain physically active. How can asthma affect my ability to be physically active? When you have asthma, physical activity can cause you to have symptoms such as:  Wheezing. This may sound like whistling while  breathing.  A feeling of tightness in the chest.  Sore throat.  Coughing.  Shortness of breath.  Tiredness (fatigue) with minimal activity.  Increased sputum production.  Chest pain. What actions can I take to prevent asthma problems during physical activity? Pulmonary rehabilitation Enroll in a pulmonary rehabilitation program. Benefits of this type of program include:  Education on lung diseases.  Classes that teach you how to exercise and be more active while decreasing your shortness of breath.  A group setting that allows you to talk with others who have asthma. Asthma action plan Follow the asthma action plan set by your health care provider. Your personal asthma plan may include:  Taking your medicines as told by your health care provider.  Avoiding your asthma triggers, except physical activity. Triggers may include cold air, dust, pollen, pet dander, and air pollution.  Tracking your asthma control.  Using a peak flow meter.  Being aware of worsening symptoms.  Knowing when to seek  emergency care. Proper breathing During exercise, follow these tips for proper breathing:  Breathe in before starting the exercise and breathe out during the hardest part of the exercise.  Take slow breaths.  Pace yourself and do not try to go too fast.  While breathing out, purse your lips. Before beginning any exercise program or new activity, talk with your health care provider. Medicines If physical activity triggers your asthma, your health care provider may order the following medicines:  A rescue inhaler (short-acting beta2-agonist) for you to use shortly before physical activity or exercise. Its effects may reduce exercise-related symptoms for 2-3 hours.  A long-acting beta2-agonist that can offer up to 12 hours of relief if taken daily.  Leukotriene modifiers. These pills are taken several hours before physical activity or exercise to help prevent asthma symptoms  that are caused by exercise.  Long-term control medicines. These will be given if you have severe or frequent asthma symptoms during or after exercise. These symptoms may also mean that your asthma is not well controlled.  General information  Exercise indoors when the air is dry or during allergy season.  Try to breathe in warm, moist air by wearing a scarf over your nose and mouth or breathing only through your nose.  Spend a few minutes warming up before your workout.  Cool down after exercise. What should I do if my asthma symptoms get worse? Contact your health care provider if your asthma symptoms are getting worse. Your asthma is getting worse if:  You have symptoms more often.  Your symptoms are more severe.  Your symptoms get worse at night and make you lose sleep.  Your peak flow number is lower than your personal best or changes a lot from day to day.  Your asthma medicines do not work as well as they used to.  You use your rescue inhaler more often. If you use your rescue inhaler more than 2 days a week, your asthma is not well controlled.  You go to the emergency room or see your health care provider because of an asthma attack. Where to find support  Ask your health care provider about signing up for a pulmonary rehabilitation program.  Ask your health care provider about asthma support groups.  Visit your local community health department.  Check out local hospitals' community health programs. Where can I get more information?  Your health care provider.  American Lung Association: lung.org  National Heart, Lung, and Blood Institute: https://www.[place].biz/ Contact a health care provider if:  You have trouble walking and talking because you are out of breath. Get help right away if:  Your lips or fingernails are blue.  You are not able to breathe or catch your breath. Summary  Physical activity is an important part of a healthy lifestyle. However, if you  have asthma, your symptoms can flare up during exercise or physical activity.  You can prevent problems during physical activity by doing pulmonary rehabilitation, following an asthma action plan, doing proper breathing, and using medicines.  Talk with your health care provider before starting any exercise program or new activity. This information is not intended to replace advice given to you by your health care provider. Make sure you discuss any questions you have with your health care provider. Document Released: 01/25/2018 Document Revised: 02/28/2019 Document Reviewed: 01/25/2018 Elsevier Patient Education  [place] Attack  Acute bronchospasm caused by asthma is also referred to as an asthma attack. Bronchospasm means  that the air passages become narrowed or "tight," which limits the amount of oxygen that can get into the lungs. The narrowing is caused by inflammation and tightening of the muscles in the air tubes (bronchi) in the lungs. Excessive mucus is also produced, which narrows the airways more. This can cause trouble breathing, coughing, and loud breathing (wheezing). What are the causes? Possible triggers include:  Animal dander from the skin, hair, or feathers of animals.  Dust mites contained in house dust.  Cockroaches.  Pollen from trees or grass.  Mold.  Cigarette or tobacco smoke.  Air pollutants such as dust, household cleaners, hair sprays, aerosol sprays, paint fumes, strong chemicals, or strong odors.  Cold air or weather changes. Cold air may trigger inflammation. Winds increase molds and pollens in the air.  Strong emotions such as crying or laughing hard.  Stress.  Certain medicines, such as aspirin or beta-blockers.  Sulfites in foods and drinks, such as dried fruits and wine.  Infections or inflammatory conditions, such as a flu, a cold, pneumonia, or inflammation of the nasal membranes (rhinitis).  Gastroesophageal reflux  disease (GERD). GERD is a condition in which stomach acid backs up into your esophagus, which can irritate nearby airway structures.  Exercise or activity that requires a lot of energy. What are the signs or symptoms? Symptoms of this condition include:  Wheezing. This may sound like whistling while breathing. This may be more noticeable at night.  Excessive coughing, particularly at night.  Chest tightness or pain.  Shortness of breath.  Feeling like you cannot get enough air no matter how hard you try (air hunger). How is this diagnosed? This condition may be diagnosed based on:  Your medical history.  Your symptoms.  A physical exam.  Tests to check for other causes of your symptoms or other conditions that may have triggered your asthma attack. These tests may include: ? Chest X-ray. ? Blood tests. ? Specialized tests to assess lung function, such as breathing into a device that measures how much air you inhale and exhale (spirometry). How is this treated? The goal of treatment is to open the airways in your lungs and reduce inflammation. Most asthma attacks are treated with medicines that you inhale through a hand-held inhaler (metered dose inhaler, MDI) or a device that turns liquid medicine into a mist that you inhale (nebulizer). Medicines may include:  Quick relief or rescue medicines that relax the muscles of the bronchi. These medicines include bronchodilators, such as albuterol.  Controller medicines, such as inhaled corticosteroids. These are long-acting medicines that are used for daily asthma maintenance. If you have a moderate or severe asthma attack, you may be treated with steroid medicines by mouth or through an IV injection at the hospital. Steroid medicines reduce inflammation in your lungs. Depending on the severity of your attack, you may need oxygen therapy to help you breathe. If your asthma attack was caused by a bacterial infection, such as pneumonia, you  will be given antibiotic medicines. Follow these instructions at home: Medicines  Take over-the-counter and prescription medicines only as told by your health care provider. Keep your medicines up-to-date and available.  If you are more than [redacted] weeks pregnant and you are prescribed any new medicines, tell your obstetrician about those medicines.  If you were prescribed an antibiotic medicine, take it as told by your health care provider. Do not stop taking the antibiotic even if you start to feel better. Avoiding triggers   Keep track  of things that trigger your asthma attacks or cause you to have breathing problems, and avoid exposure to these triggers.  Do not use any products that contain nicotine or tobacco, such as cigarettes and e-cigarettes. If you need help quitting, ask your health care provider.  Avoid secondhand smoke.  Avoid strong smells, such as perfumes, aerosols, and cleaning solvents.  When pollen or air pollution is bad, keep windows closed and use an air conditioner or go to places with air conditioning. Asthma action plan  Work with your health care provider to make a written plan for managing and treating your asthma attacks (asthma action plan). This plan should include: ? A list of your asthma triggers and how to avoid them. ? Information about when your medicines should be taken and when their dosage should be changed. ? Instructions about using a device called a peak flow meter to monitor your condition. A peak flow meter measures how well your lungs are working and measures how severe your asthma is at a given time. Your "personal best" is the highest peak flow rate you can reach when you feel good and have no asthma symptoms. General instructions  Avoid excessive exercise or activity until your asthma attack resolves. Ask your health care provider what activities are safe for you and when you can return to your normal activities.  Stay up to date on all  vaccinations recommended by your health care provider, such as flu and pneumonia vaccines.  Drink enough fluid to keep your urine clear or pale yellow. Staying hydrated helps keep mucus in your lungs thin so it can be coughed up easily.  If you drink caffeine, do so in moderation.  Do not use alcohol until you have recovered.  Keep all follow-up visits as told by your health care provider. This is important. Asthma requires careful medical care, and you and your health care provider can work together to reduce the likelihood of future attacks. Contact a health care provider if:  Your peak flow reading is still at 50-79% of your personal best after you have followed your action plan for 1 hour. This is in the yellow zone, which means "caution."  You need to use a reliever medicine more than 2-3 times a week.  Your medicines are causing side effects, such as: ? Rash. ? Itching. ? Swelling. ? Trouble breathing.  Your symptoms do not improve after 48 hours.  You cough up mucus (sputum) that is thicker than usual.  You have a fever.  You need to use your medicines much more frequently than normal. Get help right away if:  Your peak flow reading is less than 50% of your personal best. This is in the red zone, which means "danger."  You have severe trouble breathing.  You develop chest pain or discomfort.  Your medicines no longer seem to be helping.  You vomit.  You cannot eat or drink without vomiting.  You are coughing up yellow, green, brown, or bloody mucus.  You have a fever and your symptoms suddenly get worse.  You have trouble swallowing.  You feel very tired, and breathing becomes tiring. Summary  Acute bronchospasm caused by asthma is also referred to as an asthma attack.  Bronchospasm is caused by narrowing or tightness in air passages, which causes shortness of breath, coughing, and loud breathing (wheezing).  Many things can trigger an asthma attack, such  as allergens, weather changes, exercise, smoke, and other fumes.  Treatment for an asthma attack  may include inhaled rescue medicines for immediate relief, as well as the use of maintenance therapy.  Get help right away if you have worsening shortness of breath, chest pain, or fever, or if your home medicines are no longer helping with your symptoms. This information is not intended to replace advice given to you by your health care provider. Make sure you discuss any questions you have with your health care provider. Document Released: 02/24/2007 Document Revised: 02/28/2019 Document Reviewed: 12/11/2016 Elsevier Patient Education  2020 [place].   Asthma, Adult  Asthma is a long-term (chronic) condition in which the airways get tight and narrow. The airways are the breathing passages that lead from the nose and mouth down into the lungs. A person with asthma will have times when symptoms get worse. These are called asthma attacks. They can cause coughing, whistling sounds when you breathe (wheezing), shortness of breath, and chest pain. They can make it hard to breathe. There is no cure for asthma, but medicines and lifestyle changes can help control it. There are many things that can bring on an asthma attack or make asthma symptoms worse (triggers). Common triggers include:  Mold.  Dust.  Cigarette smoke.  Cockroaches.  Things that can cause allergy symptoms (allergens). These include animal skin flakes (dander) and pollen from trees or grass.  Things that pollute the air. These may include household cleaners, wood smoke, smog, or chemical odors.  Cold air, weather changes, and wind.  Crying or laughing hard.  Stress.  Certain medicines or drugs.  Certain foods such as dried fruit, potato chips, and grape juice.  Infections, such as a cold or the flu.  Certain medical conditions or diseases.  Exercise or tiring activities. Asthma may be treated with medicines and by  staying away from the things that cause asthma attacks. Types of medicines may include:  Controller medicines. These help prevent asthma symptoms. They are usually taken every day.  Fast-acting reliever or rescue medicines. These quickly relieve asthma symptoms. They are used as needed and provide short-term relief.  Allergy medicines if your attacks are brought on by allergens.  Medicines to help control the body's defense (immune) system. Follow these instructions at home: Avoiding triggers in your home  Change your heating and air conditioning filter often.  Limit your use of fireplaces and wood stoves.  Get rid of pests (such as roaches and mice) and their droppings.  Throw away plants if you see mold on them.  Clean your floors. Dust regularly. Use cleaning products that do not smell.  Have someone vacuum when you are not home. Use a vacuum cleaner with a HEPA filter if possible.  Replace carpet with wood, tile, or vinyl flooring. Carpet can trap animal skin flakes and dust.  Use allergy-proof pillows, mattress covers, and box spring covers.  Wash bed sheets and blankets every week in hot water. Dry them in a dryer.  Keep your bedroom free of any triggers.  Avoid pets and keep windows closed when things that cause allergy symptoms are in the air.  Use blankets that are made of polyester or cotton.  Clean bathrooms and kitchens with bleach. If possible, have someone repaint the walls in these rooms with mold-resistant paint. Keep out of the rooms that are being cleaned and painted.  Wash your hands often with soap and water. If soap and water are not available, use hand sanitizer.  Do not allow anyone to smoke in your home. General instructions  Take over-the-counter  and prescription medicines only as told by your doctor. ? Talk with your doctor if you have questions about how or when to take your medicines. ? Make note if you need to use your medicines more often  than usual.  Do not use any products that contain nicotine or tobacco, such as cigarettes and e-cigarettes. If you need help quitting, ask your doctor.  Stay away from secondhand smoke.  Avoid doing things outdoors when allergen counts are high and when air quality is low.  Wear a ski mask when doing outdoor activities in the winter. The mask should cover your nose and mouth. Exercise indoors on cold days if you can.  Warm up before you exercise. Take time to cool down after exercise.  Use a peak flow meter as told by your doctor. A peak flow meter is a tool that measures how well the lungs are working.  Keep track of the peak flow meter's readings. Write them down.  Follow your asthma action plan. This is a written plan for taking care of your asthma and treating your attacks.  Make sure you get all the shots (vaccines) that your doctor recommends. Ask your doctor about a flu shot and a pneumonia shot.  Keep all follow-up visits as told by your doctor. This is important. Contact a doctor if:  You have wheezing, shortness of breath, or a cough even while taking medicine to prevent attacks.  The mucus you cough up (sputum) is thicker than usual.  The mucus you cough up changes from clear or white to yellow, green, gray, or bloody.  You have problems from the medicine you are taking, such as: ? A rash. ? Itching. ? Swelling. ? Trouble breathing.  You need reliever medicines more than 2-3 times a week.  Your peak flow reading is still at 50-79% of your personal best after following the action plan for 1 hour.  You have a fever. Get help right away if:  You seem to be worse and are not responding to medicine during an asthma attack.  You are short of breath even at rest.  You get short of breath when doing very little activity.  You have trouble eating, drinking, or talking.  You have chest pain or tightness.  You have a fast heartbeat.  Your lips or fingernails  start to turn blue.  You are light-headed or dizzy, or you faint.  Your peak flow is less than 50% of your personal best.  You feel too tired to breathe normally. Summary  Asthma is a long-term (chronic) condition in which the airways get tight and narrow. An asthma attack can make it hard to breathe.  Asthma cannot be cured, but medicines and lifestyle changes can help control it.  Make sure you understand how to avoid triggers and how and when to use your medicines. This information is not intended to replace advice given to you by your health care provider. Make sure you discuss any questions you have with your health care provider. Document Released: 04/27/2008 Document Revised: 01/12/2019 Document Reviewed: 12/14/2016 Elsevier Patient Education  [employer].   Bronchospasm, Adult  Bronchospasm is a tightening of the airways going into the lungs. During an episode, it may be harder to breathe. You may cough, and you may make a whistling sound when you breathe (wheeze). This condition often affects people with asthma. What are the causes? This condition is caused by swelling and irritation in the airways. It can be triggered by:  An infection (common).  Seasonal allergies.  An allergic reaction.  Exercise.  Irritants. These include pollution, cigarette smoke, strong odors, aerosol sprays, and paint fumes.  Weather changes. Winds increase molds and pollens in the air. Cold air may cause swelling.  Stress and emotional upset. What are the signs or symptoms? Symptoms of this condition include:  Wheezing. If the episode was triggered by an allergy, wheezing may start right away or hours later.  Nighttime coughing.  Frequent or severe coughing with a simple cold.  Chest tightness.  Shortness of breath.  Decreased ability to exercise. How is this diagnosed? This condition is usually diagnosed with a review of your medical history and a physical exam. Tests,  such as lung function tests, are sometimes done to look for other conditions. The need for a chest X-ray depends on where the wheezing occurs and whether it is the first time you have wheezed. How is this treated? This condition may be treated with:  Inhaled medicines. These open up the airways and help you breathe. They can be taken with an inhaler or a nebulizer device.  Corticosteroid medicines. These may be given for severe bronchospasm, usually when it is associated with asthma.  Avoiding triggers, such as irritants, infection, or allergies. Follow these instructions at home: Medicines  Take over-the-counter and prescription medicines only as told by your health care provider.  If you need to use an inhaler or nebulizer to take your medicine, ask your health care provider to explain how to use it correctly. If you were given a spacer, always use it with your inhaler. Lifestyle  Reduce the number of triggers in your home. To do this: ? Change your heating and air conditioning filter at least once a month. ? Limit your use of fireplaces and wood stoves. ? Do not smoke. Do not allow smoking in your home. ? Avoid using perfumes and fragrances. ? Get rid of pests, such as roaches and mice, and their droppings. ? Remove any mold from your home. ? Keep your house clean and dust free. Use unscented cleaning products. ? Replace carpet with wood, tile, or vinyl flooring. Carpet can trap dander and dust. ? Use allergy-proof pillows, mattress covers, and box spring covers. ? Wash bed sheets and blankets every week in hot water. Dry them in a dryer. ? Use blankets that are made of polyester or cotton. ? Wash your hands often. ? Do not allow pets in your bedroom.  Avoid breathing in cold air when you exercise. General instructions  Have a plan for seeking medical care. Know when to call your health care provider and local emergency services, and where to get emergency care.  Stay up to  date on your immunizations.  When you have an episode of bronchospasm, stay calm. Try to relax and breathe more slowly.  If you have asthma, make sure you have an asthma action plan.  Keep all follow-up visits as told by your health care provider. This is important. Contact a health care provider if:  You have muscle aches.  You have chest pain.  The mucus that you cough up (sputum) changes from clear or white to yellow, green, gray, or bloody.  You have a fever.  Your sputum gets thicker. Get help right away if:  Your wheezing and coughing get worse, even after you take your prescribed medicines.  It gets even harder to breathe.  You develop severe chest pain. Summary  Bronchospasm is a tightening of the airways going  into the lungs.  During an episode of bronchospasm, you may have a harder time breathing. You may cough and make a whistling sound when you breathe (wheeze).  Avoid exposure to triggers such as smoke, dust, mold, animal dander, and fragrances.  When you have an episode of bronchospasm, stay calm. Try to relax and breathe more slowly. This information is not intended to replace advice given to you by your health care provider. Make sure you discuss any questions you have with your health care provider. Document Released: 11/12/2003 Document Revised: 10/22/2017 Document Reviewed: 11/05/2016 Elsevier Patient Education  2020 [place], Adult An asthma action plan helps you understand how to manage your asthma and what to do when you have an asthma attack. The action plan is a color-coded plan that lists the symptoms that indicate whether or not your condition is under control and what actions to take.  If you have symptoms in the green zone, it means you are doing well.  If you have symptoms in the yellow zone, it means you are having problems.  If you have symptoms in the red zone, you need medical care right away. Follow the plan  that you and your health care provider develop. Review your plan with your health care provider at each visit. What triggers your asthma? Knowing the things that can trigger an asthma attack or make your asthma symptoms worse is very important. Talk to your health care provider about your asthma triggers and how to avoid them. Record your known asthma triggers here: _______________ What is your personal best peak flow reading? If you use a peak flow meter, determine your personal best reading. Record it here: _______________ Red zone Symptoms in this zone mean that you should get medical help right away. You will likely feel distressed and have symptoms at rest that restrict your activity. You are in the red zone if:  You are breathing hard and quickly.  Your nose opens wide, your ribs show, and your neck muscles become visible when you breathe in.  Your lips, fingers, or toes are a bluish color.  You have trouble speaking in full sentences.  Your peak flow reading is less than __________ (less than 50% of your personal best).  Your symptoms do not improve within 15-20 minutes after you use your reliever or rescue medicine (bronchodilator). If you have any of these symptoms:  Call your local emergency services (911 in the [place] or go to the nearest emergency room.  Use your reliever or rescue medicine. ? Start a nebulizer treatment or take 2-4 puffs from a metered-dose inhaler with a spacer. ? Repeat this action every 15-20 minutes until help arrives. Yellow zone Symptoms in this zone mean that your condition may be getting worse. You may have symptoms that interfere with exercise, are noticeably worse after exposure to triggers, or are worse at the first sign of a cold (upper respiratory infection). These may include:  Waking from sleep.  Coughing, especially at night or first thing in the morning.  Mild wheezing.  Chest tightness.  A peak flow reading that is __________ to  __________ (50-79% of your personal best). If you have any of these symptoms:  Add the following medicine to the ones that you use daily: ? Reliever or rescue medicine and dosage: _______________ ? Additional medicine and dosage: _______________ Call your health care provider if:  You remain in the yellow zone for __________ hours.  You are using a  reliever or rescue medicine more than 2-3 times a week. Green zone This zone means that your asthma is under control. You may not have any symptoms while you are in the green zone. This means that you:  Have no coughing or wheezing, even while you are working or playing.  Sleep through the night.  Are breathing well.  Have a peak flow reading that is above __________ (80% of your personal best or greater). If you are in the green zone, continue to manage your asthma as directed:  Take these medicines every day: ? Controller medicine and dosage: _______________ ? Controller medicine and dosage: _______________ ? Controller medicine and dosage: _______________ ? Controller medicine and dosage: _______________  Before exercise, use this reliever or rescue medicine: _______________ Call your health care provider if you are using a reliever or rescue medicine more than 2-3 times a week. Where to find more information You can find more information about asthma from:  Centers for Disease Control and Prevention: SamedayLab.co.za  American Lung Association: www.lung.org This information is not intended to replace advice given to you by your health care provider. Make sure you discuss any questions you have with your health care provider. Document Released: 09/06/2009 Document Revised: 08/23/2018 Document Reviewed: 07/21/2017 Elsevier Patient Education  [place].   Hypoxemia  Hypoxemia occurs when the blood does not contain enough oxygen. The body cannot work well when it does not have enough oxygen because every part of the  body needs oxygen. Oxygen enters the lungs when we breathe in, then it travels to all parts of the body through the blood. Hypoxemia can develop suddenly or slowly. What are the causes? Common causes of this condition include:  Long-term (chronic) lung diseases, such as chronic obstructive pulmonary disease (COPD) or interstitial lung disease.  Disorders that affect breathing at night, such as sleep apnea.  Fluid buildup in the lungs (pulmonary edema).  Lung infection (pneumonia).  Lung or throat cancer.  Abnormal blood flow that bypasses the lungs (having a shunt).  Certain diseases that affect nerves or muscles.  A collapsed lung (pneumothorax).  A blood clot in the lungs (pulmonary embolus).  Certain types of heart disease.  Slow or shallow breathing (hypoventilation).  Certain medicines.  High altitudes.  Toxic chemicals, smoke, and gases. What are the signs or symptoms? In some cases, there may be no symptoms of this condition. If you do have symptoms, they may include:  Shortness of breath (dyspnea).  Bluish color of the skin, lips, or nail beds.  Breathing that is fast, noisy, or shallow.  A fast heartbeat.  Feeling tired or sleepy.  Feeling confused or worried. If hypoxemia develops quickly, you will likely have dyspnea. If hypoxemia develops slowly over months or years, you may not notice any symptoms. How is this diagnosed? This condition is diagnosed by:  A physical exam.  Blood tests.  A test that measures the percentage of oxygen in your blood (pulse oximetry). This is done with a sensor that is placed on your finger, toe, or earlobe. How is this treated? Treatment for this condition depends on the underlying cause of your hypoxemia. You will likely be treated with oxygen therapy to restore your blood oxygen level. Depending on the cause of your hypoxemia, you may need oxygen therapy for a short time (weeks or months), or you may need it for the  rest of your life. Your health care provider may also recommend other therapies to treat the underlying cause of  your hypoxemia. Follow these instructions at home:   Take over-the-counter and prescription medicines only as told by your health care provider.  If you are on oxygen therapy, follow oxygen safety precautions as directed by your health care provider. These may include: ? Always having a backup supply of oxygen. ? Not allowing anyone to smoke or have a fire around oxygen. ? Handling oxygen tanks carefully and as instructed.  Do not use any products that contain nicotine or tobacco, such as cigarettes and e-cigarettes. If you need help quitting, ask your health care provider. Stay away from people who smoke.  Keep all follow-up visits as told by your health care provider. This is important. Contact a health care provider if:  You have any concerns about your oxygen therapy.  You have trouble breathing, even during or after treatment.  You become short of breath when you exercise.  You are tired when you wake up.  You have a headache when you wake up. Get help right away if:  Your shortness of breath gets worse, especially with normal or minimal activity.  You have a bluish color of the skin, lips, or nail beds.  You become confused or you cannot think properly.  You cough up dark mucus or blood.  You have chest pain.  You have a fever. Summary  Hypoxemia occurs when the blood does not contain enough oxygen.  Hypoxemia may or may not cause symptoms. Often, the main symptom is shortness of breath (dyspnea).  Depending on the cause of your hypoxemia, you may need oxygen therapy for a short time (weeks or months), or you may need it for the rest of your life.  If you are on oxygen therapy, follow oxygen safety precautions as directed by your health care provider. This information is not intended to replace advice given to you by your health care provider. Make  sure you discuss any questions you have with your health care provider. Document Released: 05/25/2011 Document Revised: 08/30/2018 Document Reviewed: 10/13/2016 Elsevier Patient Education  2020 [employer].

## 2019-07-02 NOTE — Progress Notes (Signed)
Pt for d/c home with family. Awaiting ride home. D/C instructions given. Pt verbalized understanding. Will call primary and neph providres for post hospital f/u appts/ IV d/c. No changes in initial am assessment. Stable for d/c

## 2019-07-02 NOTE — TOC Initial Note (Signed)
Transition of Care [facility]) - Initial/Assessment Note    Patient Details  Name: ILINE BUCHINGER MRN: [id number] Date of Birth: 07/24/1956  Transition of [facility] CM/SW Contact:    Joaquin Courts, RN Phone Number: 07/02/2019, 10:51 AM  Clinical Narrative:    CM spoke with patient who reports she is active with [facility] for [place]. This was confirmed and services will continue after discharge. Pt reports she has rollin walker, bedside commode, and nebulizer machine at home.                Expected Discharge Plan: [place] Barriers to Discharge: No Barriers Identified   Patient Goals and CMS Choice Patient states their goals for this hospitalization and ongoing recovery are:: to go home CMS Medicare.gov Compare Post Acute Care list provided to:: Patient Choice offered to / list presented to : Patient  Expected Discharge Plan and Services Expected Discharge Plan: [place]   Discharge Planning Services: CM Consult Post Acute Care Choice: [place] arrangements for the past 2 months: Single Family Home Expected Discharge Date: 07/02/19               DME Arranged: N/A DME Agency: NA       HH Arranged: PT HH Agency: [facility] [employer]) Date [place]: 07/02/19 Time [place]: [place] spoke with at [place]: [place] Arrangements/Services Living arrangements for the past 2 months: [place]   Patient language and need for interpreter reviewed:: Yes Do you feel safe going back to the place where you live?: Yes      Need for Family Participation in Patient Care: Yes (Comment) Care giver support system in place?: Yes (comment)   Criminal Activity/Legal Involvement Pertinent to Current Situation/Hospitalization: No - Comment as needed  Activities of Daily Living Home Assistive Devices/Equipment: Eyeglasses, Environmental consultant (specify type) ADL Screening (condition  at time of admission) Patient's cognitive ability adequate to safely complete daily activities?: Yes Is the patient deaf or have difficulty hearing?: No Does the patient have difficulty seeing, even when wearing glasses/contacts?: No Does the patient have difficulty concentrating, remembering, or making decisions?: No Patient able to express need for assistance with ADLs?: Yes Does the patient have difficulty dressing or bathing?: Yes Independently performs ADLs?: No Does the patient have difficulty walking or climbing stairs?: Yes Weakness of Legs: Both Weakness of Arms/Hands: None  Permission Sought/Granted                  Emotional Assessment Appearance:: Appears stated age Attitude/Demeanor/Rapport: Engaged Affect (typically observed): Accepting Orientation: : Oriented to Place, Oriented to  Time, Oriented to Situation, Oriented to Self   Psych Involvement: No (comment)  Admission diagnosis:  Hypoxia [R09.02] Exacerbation of asthma, unspecified asthma severity, unspecified whether persistent [J45.901] Patient Active Problem List   Diagnosis Date Noted  . Hyponatremia 06/29/2019  . Acute respiratory failure with hypoxia ([place]) 04/19/2019  . Acute on chronic diastolic CHF (congestive heart failure) ([place]) 04/19/2019  . Bilateral closed proximal tibial fracture 12/21/2018  . Closed right ankle fracture 12/21/2018  . Fracture 12/21/2018  . Lower urinary tract infectious disease 10/03/2018  . Asthma, chronic, unspecified asthma severity, with acute exacerbation 10/03/2018  . SIRS (systemic inflammatory response syndrome) ([place]) 10/03/2018  . Nausea vomiting and diarrhea 10/02/2018  . Chronic diastolic CHF (congestive heart failure) ([place]) 10/01/2017  . Influenza B 10/01/2017  . Persistent asthma with  status asthmaticus   . Pneumonia 07/15/2016  . Asthma, mild intermittent 07/15/2016  . GERD (gastroesophageal reflux disease) 07/15/2016  . Renal transplant recipient  07/15/2016  . Hyperlipidemia 07/15/2016  . Depression 07/15/2016  . Bronchitis, mucopurulent recurrent ([place]) 08/08/2014  . Chronic cough 07/24/2014  . End stage renal disease ([place]) 03/23/2012  . 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, implant, and graft 03/23/2012   PCP:  [name], MD Pharmacy:   [place], [place] [address] Phone: [phone] Fax: [phone]     Social Determinants of Health (SDOH) Interventions    Readmission Risk Interventions Readmission Risk Prevention Plan 04/20/2019  Transportation Screening Complete  PCP or Specialist Appt within 3-5 Days Not Complete  Not Complete comments not yet ready for d/c  HRI or [place] Not Complete  HRI or Home Care Consult comments no needs identified  Social Work Consult for [place] Planning/Counseling Not Complete  SW consult not completed comments no needs identified  Palliative Care Screening Not Applicable  Medication Review Press photographer) Complete  Some recent data might be hidden

## 2019-07-02 NOTE — Plan of Care (Signed)
D/C HOME

## 2019-08-16 ENCOUNTER — Ambulatory Visit (INDEPENDENT_AMBULATORY_CARE_PROVIDER_SITE_OTHER): Payer: Medicare Other | Admitting: Pulmonary Disease

## 2019-08-16 ENCOUNTER — Other Ambulatory Visit: Payer: Self-pay

## 2019-08-16 ENCOUNTER — Encounter: Payer: Self-pay | Admitting: Pulmonary Disease

## 2019-08-16 VITALS — BP 114/60 | HR 80 | Temp 97.2°F | Ht 61.0 in | Wt 175.0 lb

## 2019-08-16 DIAGNOSIS — J45901 Unspecified asthma with (acute) exacerbation: Secondary | ICD-10-CM | POA: Diagnosis not present

## 2019-08-16 LAB — CBC WITH DIFFERENTIAL/PLATELET
Basophils Absolute: 0.1 10*3/uL (ref 0.0–0.1)
Basophils Relative: 0.5 % (ref 0.0–3.0)
Eosinophils Absolute: 0.2 10*3/uL (ref 0.0–0.7)
Eosinophils Relative: 2.1 % (ref 0.0–5.0)
HCT: 37.1 % (ref 36.0–46.0)
Hemoglobin: 11.6 g/dL — ABNORMAL LOW (ref 12.0–15.0)
Lymphocytes Relative: 14.3 % (ref 12.0–46.0)
Lymphs Abs: 1.4 10*3/uL (ref 0.7–4.0)
MCHC: 31.3 g/dL (ref 30.0–36.0)
MCV: 89.1 fl (ref 78.0–100.0)
Monocytes Absolute: 0.3 10*3/uL (ref 0.1–1.0)
Monocytes Relative: 3.4 % (ref 3.0–12.0)
Neutro Abs: 7.8 10*3/uL — ABNORMAL HIGH (ref 1.4–7.7)
Neutrophils Relative %: 79.7 % — ABNORMAL HIGH (ref 43.0–77.0)
Platelets: 294 10*3/uL (ref 150.0–400.0)
RBC: 4.16 Mil/uL (ref 3.87–5.11)
RDW: 20.6 % — ABNORMAL HIGH (ref 11.5–15.5)
WBC: 9.8 10*3/uL (ref 4.0–10.5)

## 2019-08-16 NOTE — Progress Notes (Addendum)
April [name]    [id number]    1956-08-19  Primary Care Physician:McNeill, Abigail Butts, MD  Referring Physician: [name], [address] [place],  [place] [address]  Chief complaint: Follow-up for asthma  HPI: April [name] has history of asthma, GERD, kidney failure status post transplant in 2013 secondary to polycystic kidney disease, hyperlipidemia  Previously seen in pulmonary clinic in 2017 and lost to follow-up.  At that time she had mild symptoms and was prescribed Symbicort.  She had stopped taking Symbicort since her dyspnea was under good control.  Over the past 1 to 2 years she reports increasing frequency of exacerbation requiring multiple hospitalizations and prednisone taper.  Asthma control worsened after she had tibial fracture and was sent to rehab in February 2020.  Hospitalized at [facility] in August 2020 for asthma exacerbation treated with prednisone taper.  Reports recent mold exposure from leaky roof.  She also has history of GERD and symptoms are controlled on Protonix once daily.  Pets: No pets Occupation: Retired Education officer, museum Exposures: Reports roof leak and discovery of mold in the home in August 2020.  They are in the process of getting this fixed. Smoking history: 7-pack-year smoker.  Quit in 1989 Travel history: No significant travel history Relevant family history: No significant family history of lung disease  ACT score 08/16/2019-16  Outpatient Encounter Medications as of 08/16/2019  Medication Sig  . acyclovir (ZOVIRAX) 400 MG tablet Take 400 mg by mouth 2 (two) times daily as needed (outbreaks).   [name] albuterol (PROVENTIL HFA;VENTOLIN HFA) 108 (90 Base) MCG/ACT inhaler Inhale 2 puffs into the lungs every 4 (four) hours as needed for wheezing or shortness of breath.  [name] albuterol (PROVENTIL) (2.5 MG/3ML) 0.083% nebulizer solution Take 3 mLs (2.5 mg total) by nebulization 3 (three) times daily as needed for wheezing or  shortness of breath.  [name] alendronate (FOSAMAX) 70 MG tablet Take 70 mg by mouth every 7 (seven) days.  [name] allopurinol (ZYLOPRIM) 300 MG tablet Take 300 mg by mouth daily.  [name] aspirin EC 81 MG tablet Take 81 mg by mouth daily.  . brimonidine (ALPHAGAN P) 0.1 % SOLN Place 1 drop 2 (two) times daily into the left eye.  . cholecalciferol (VITAMIN D3) 25 MCG (1000 UT) tablet Take 1,000 Units by mouth daily.   . Colchicine 0.6 MG CAPS Take 0.6 mg by mouth daily as needed (gout).   . cycloSPORINE (RESTASIS) 0.05 % ophthalmic emulsion Place 1 drop into both eyes 2 (two) times daily.  . dorzolamide (TRUSOPT) 2 % ophthalmic solution Place 1 drop into both eyes daily.  [name] FLUoxetine (PROZAC) 40 MG capsule Take 40 mg by mouth daily.  . furosemide (LASIX) 40 MG tablet Take 1 tablet by mouth daily.   [name] latanoprost (XALATAN) 0.005 % ophthalmic solution Place 1 drop into both eyes at bedtime.  . magnesium oxide (MAG-OX) 400 MG tablet Take 400 mg by mouth 2 (two) times daily.  . mycophenolate (MYFORTIC) 180 MG EC tablet Take 180 mg by mouth 2 (two) times daily.  . pantoprazole (PROTONIX) 40 MG tablet Take 40 mg by mouth daily.   . prednisoLONE acetate (PRED FORTE) 1 % ophthalmic suspension Place 1 drop into both eyes See admin instructions. Instill 1 drop left eye four times daily and 1 drop right eye once daily.  . predniSONE (DELTASONE) 5 MG tablet Take 7.5 mg by mouth daily.   [name] PROGRAF 1 MG  capsule Take 1 mg by mouth daily.  . SYMBICORT 80-4.5 MCG/ACT inhaler Inhale 2 puffs into the lungs 2 (two) times a day.   . tacrolimus (PROGRAF) 0.5 MG capsule Take 0.5 mg by mouth at bedtime. Takes brand name Prograf  . TRAVATAN Z 0.004 % SOLN ophthalmic solution Place 1 drop into both eyes at bedtime.  . vitamin B-12 (CYANOCOBALAMIN) 100 MCG tablet Take 100 mcg by mouth daily.   No facility-administered encounter medications on file as of 08/16/2019.     Allergies as of 08/16/2019 - Review Complete 08/16/2019   Allergen Reaction Noted  . Infed [iron dextran] Other (See Comments) 02/26/2012  . Pentamidine Itching, Shortness Of Breath, and Swelling 07/24/2014  . Erythromycin [erythromycin] Other (See Comments) 02/26/2012  . Oxycodone Nausea Only and Nausea And Vomiting 07/24/2014  . Erythromycin Rash 07/24/2014  . Ultram [tramadol hcl] Anxiety 02/26/2012    Past Medical History:  Diagnosis Date  . Arthritis   . Asthma   . Depression   . GERD (gastroesophageal reflux disease)   . Hyperlipidemia   . Hyperparathyroidism   . Polycystic kidney   . PONV (postoperative nausea and vomiting)     Past Surgical History:  Procedure Laterality Date  . 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 BILATERAL OOPHORECTOMY    . CARPAL TUNNEL RELEASE  2000  . CESAREAN SECTION    . INSERTION OF DIALYSIS CATHETER  03/31/2012   Procedure: INSERTION OF DIALYSIS CATHETER;  Surgeon: Angelia Mould, MD;  Location: [facility];  Service: Vascular;  Laterality: N/A;  insertion of dialysis catheter right internal jugular vein  . 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 ORIF TIBIA FRACTURE Left 12/23/2018   Procedure: OPEN REDUCTION INTERNAL FIXATION (ORIF) TIBIA FRACTURE;  Surgeon: Shona Needles, MD;  Location: [facility];  Service: Orthopedics;  Laterality: Left;  . ORIF TIBIA PLATEAU Right 12/23/2018   Procedure: OPEN REDUCTION INTERNAL FIXATION (ORIF) TIBIAL PLATEAU;  Surgeon: Shona Needles, MD;  Location: [facility];  Service: Orthopedics;  Laterality: Right;  . TONSILLECTOMY  1967  . TUBAL LIGATION  2010    Family History  Problem Relation Age of Onset  . Hypertension Mother   . Heart disease Father        CABG history  . Polycystic kidney disease Father   . Polycystic kidney disease Brother     Social History   Socioeconomic History  . Marital status: Married    Spouse name: Not on file  . Number of children: Not on file  . Years of education: Not on file  . Highest education level: Not  on file  Occupational History  . Occupation: retired  Scientific laboratory technician  . Financial resource strain: Not on file  . Food insecurity    Worry: Not on file    Inability: Not on file  . Transportation needs    Medical: Not on file    Non-medical: Not on file  Tobacco Use  . Smoking status: Former Smoker    Packs/day: 0.25    Years: 15.00    Pack years: 3.75    Types: Cigarettes    Quit date: 03/22/1998    Years since quitting: 21.4  . Smokeless tobacco: Never Used  Substance and Sexual Activity  . Alcohol use: No    Comment: social  . Drug use: No  . Sexual activity: Not on file  Lifestyle  . Physical activity    Days per week:  Not on file    Minutes per session: Not on file  . Stress: Not on file  Relationships  . Social Herbalist on phone: Not on file    Gets together: Not on file    Attends religious service: Not on file    Active member of club or organization: Not on file    Attends meetings of clubs or organizations: Not on file    Relationship status: Not on file  . Intimate partner violence    Fear of current or ex partner: Not on file    Emotionally abused: Not on file    Physically abused: Not on file    Forced sexual activity: Not on file  Other Topics Concern  . Not on file  Social History Narrative   Married, lives with spouse, son and mother   2 children > son and daughter   OCCUPATION: retired Education officer, museum    Review of systems: Review of Systems  Constitutional: Negative for fever and chills.  HENT: Negative.   Eyes: Negative for blurred vision.  Respiratory: as per HPI  Cardiovascular: Negative for chest pain and palpitations.  Gastrointestinal: Negative for vomiting, diarrhea, blood per rectum. Genitourinary: Negative for dysuria, urgency, frequency and hematuria.  Musculoskeletal: Negative for myalgias, back pain and joint pain.  Skin: Negative for itching and rash.  Neurological: Negative for dizziness, tremors, focal weakness,  seizures and loss of consciousness.  Endo/Heme/Allergies: Negative for environmental allergies.  Psychiatric/Behavioral: Negative for depression, suicidal ideas and hallucinations.  All other systems reviewed and are negative.  Physical Exam: Blood pressure 114/60, pulse 80, temperature (!) 97.2 F (36.2 C), temperature source Temporal, height 5\' 1"  (1.549 m), weight 175 lb (79.4 kg), SpO2 96 %. Gen:      No acute distress HEENT:  EOMI, sclera anicteric Neck:     No masses; no thyromegaly Lungs:    Clear to auscultation bilaterally; normal respiratory effort CV:         Regular rate and rhythm; no murmurs Abd:      + bowel sounds; soft, non-tender; no palpable masses, no distension Ext:    No edema; adequate peripheral perfusion Skin:      Warm and dry; no rash Neuro: alert and oriented x 3 Psych: normal mood and affect  Data Reviewed: Imaging: CT high res 07/27/14 No ILD.   Chest x-ray 06/29/2019-mild vascular congestion, bibasilar atelectasis.  I reviewed the images personally.  PFTs: 08/08/14 FVC 1.66 [52%] , FEV1 1.35 (57%], F/F 81, RV/TLC elevated. Mild obstructive small airway disease, hyperinflation, air trapping.  10/26/16 FVC 1.41 [47%), FEV1 1.20 (52%), F/F 85, TLC 68%, RV/TLC 127%, DLCO 71% Minimal obstructive airway disease, hyperinflation, mild restriction and DLCO impairment  FENO 08/26/16- 13  Labs: CBC10/4/17- WBC 11, Eos 1.6, Absolute eos 200 CBC 04/20/2019-WBC 16.8, eos 1%, absolute eosinophil count 160 Blood allergy profile 08/26/16- Negative, IgE 4  Assessment:  Asthma Has worsening control over the past few years.  This could be secondary to mold as she reports her roof leak. She is in the process of getting her house cleared of the mold  Continue Symbicort 160 Reassess with CBC differential, IgE, mold antibody panel.  Her eosinophils are likely low as she is on chronic prednisone for kidney transplant Recheck pulmonary function test Based on these  results we may consider a biologic  GERD Continue PPI  Plan/Recommendations: - CBC, IgE, mold antibody panel - PFTs - Continue Symbicort  Marshell Garfinkel MD [facility] 08/16/2019, 11:04 AM  CC: [name], MD

## 2019-08-16 NOTE — Addendum Note (Signed)
Addended by: Suzzanne Cloud E on: 08/16/2019 11:36 AM   Modules accepted: Orders

## 2019-08-16 NOTE — Patient Instructions (Signed)
We will get some labs today including CBC differential, IgE, mold antibody panel, hypersensitivity panel Continue the Symbicort We will schedule you for pulmonary function test Follow-up 2 to 4 weeks after PFTs.

## 2019-08-17 LAB — ALLERGEN PROFILE, MOLD
Allergen, A. alternata, m6: <0.1 kU/L
Allergen, Mucor Racemosus, M4: <0.1 kU/L
Allergen, P. notatum, m1: <0.1 kU/L
Allergen, S. Botryosum, m10: <0.1 kU/L
Aspergillus fumigatus, m3: <0.1 kU/L
CLADOSPORIUM HERBARUM (M2) IGE: <0.1 kU/L
CLASS: 0
Class: 0
Class: 0
Class: 0
Class: 0
Class: 0

## 2019-08-17 LAB — IGE: IgE (Immunoglobulin E), Serum: <2 kU/L (ref <=114)

## 2019-08-17 LAB — INTERPRETATION:

## 2019-08-22 LAB — HYPERSENSITIVITY PNEUMONITIS
A. Pullulans Abs: NEGATIVE
A.Fumigatus #1 Abs: NEGATIVE
Micropolyspora faeni, IgG: NEGATIVE
Pigeon Serum Abs: NEGATIVE
Thermoact. Saccharii: NEGATIVE
Thermoactinomyces vulgaris, IgG: NEGATIVE

## 2019-08-28 ENCOUNTER — Telehealth: Payer: Self-pay | Admitting: Pulmonary Disease

## 2019-08-28 NOTE — Telephone Encounter (Signed)
LMTCB   Notes recorded by Marshell Garfinkel, MD on 08/25/2019 at 5:21 PM EDT  Labs look okay and stable.

## 2019-08-29 NOTE — Progress Notes (Signed)
Spoke with pt and notified of results per Dr. Mannam.  Pt verbalized understanding and denied any questions. 

## 2019-08-29 NOTE — Telephone Encounter (Signed)
Pt returned missed call 

## 2019-08-29 NOTE — Telephone Encounter (Signed)
ATC patient unable to reach LM to call back office (x2) 

## 2019-08-29 NOTE — Telephone Encounter (Signed)
Spoke with pt and notified of results per Dr. Mannam.  Pt verbalized understanding and denied any questions. 

## 2019-09-19 ENCOUNTER — Other Ambulatory Visit (HOSPITAL_COMMUNITY)
Admission: RE | Admit: 2019-09-19 | Discharge: 2019-09-19 | Disposition: A | Discharge status: Home / Self Care | Payer: Medicare Other | Source: Ambulatory Visit | Attending: Pulmonary Disease | Admitting: Pulmonary Disease

## 2019-09-19 DIAGNOSIS — Z01812 Encounter for preprocedural laboratory examination: Secondary | ICD-10-CM | POA: Insufficient documentation

## 2019-09-19 DIAGNOSIS — Z20828 Contact with and (suspected) exposure to other viral communicable diseases: Secondary | ICD-10-CM | POA: Insufficient documentation

## 2019-09-20 LAB — NOVEL CORONAVIRUS, NAA (HOSP ORDER, SEND-OUT TO REF LAB; TAT 18-24 HRS): SARS-CoV-2, NAA: NOT DETECTED

## 2019-09-21 NOTE — Progress Notes (Signed)
@Patient  ID: April [name], female    DOB: Feb 16, 1956, 63 y.o.   MRN: [id number]  Chief Complaint  Patient presents with  . Follow-up    F/U PFT Patient stated breathing is good.    Referring provider: [name], MD  HPI:  63 year old female former smoker followed in our office for asthma.  Patient previously been managed in our office in 2017 but was lost to follow-up.  She reconsulted with our practice on 08/16/2019 after worsening symptoms of shortness of breath as well as multiple hospitalizations or emergency room visits for exacerbations requiring prednisone tapers.  She was hospitalized at [facility] in August/2020 for an asthma exacerbation.  PMH: GERD, kidney failure status post transplant 2013 secondary to polycystic kidney disease, hyperlipidemia Smoker/ Smoking History: Former smoker.  Quit 1999.  3.75-pack-year smoking history Maintenance:  Symbicort 80, Prednisone 5mg  daily  Pt of: Dr. Vaughan Browner  09/22/2019  - Visit   63 year old female former smoker followed in our office for asthma.  Patient completing follow-up with our office today.  Patient completed pulmonary function test today.  Those results are listed below:  09/22/2019-pulmonary function test-FVC 1.33 (46% predicted), postbronchodilator ratio 83, postbronchodilator FEV1 1.19 (53% predicted), positive bronchodilator response in FEV1 as well as mid flow reversibility, DLCO 12.9 (71% predicted)  These results today are fairly comparable to previous pulmonary function testing results in 2017.  Patient's recent blood work from last office visit has also been stable.  Patient is maintained on Symbicort 80.  She uses her rescue inhaler maybe 1-2 times a week.  She is currently finishing up home physical therapy after her fracture.  They are recommending outpatient physical therapy.  She is hoping to get an extension on her home physical therapy as she is concerned about going to outpatient physical therapy during a  pandemic.  Questionaires / Pulmonary Flowsheets:   ACT:  Asthma Control Test ACT Total Score  09/22/2019 19  08/16/2019 16   Tests:   Imaging: CT high res 07/27/14 No ILD.   Chest x-ray 06/29/2019-mild vascular congestion, bibasilar atelectasis.   PFTs: 08/08/14 FVC 1.66 [52%] , FEV1 1.35 (57%], F/F 81, RV/TLC elevated. Mild obstructive small airway disease, hyperinflation, air trapping.  10/26/16 FVC 1.41 [47%), FEV1 1.20 (52%), F/F 85, TLC 68%, RV/TLC 127%, DLCO 71% Minimal obstructive airway disease, hyperinflation, mild restriction and DLCO impairment  09/22/2019-pulmonary function test-FVC 1.33 (46% predicted), postbronchodilator ratio 83, postbronchodilator FEV1 1.19 (53% predicted), positive bronchodilator response in FEV1 as well as mid flow reversibility, DLCO 12.9 (71% predicted)  FENO 08/26/16- 13  Labs: CBC10/4/17- WBC 11, Eos 1.6, Absolute eos 200 CBC 04/20/2019-WBC 16.8, eos 1%, absolute eosinophil count 160 Blood allergy profile 08/26/16- Negative, IgE 4  08/16/2019-CBC with differential-eosinophils absolute 0.2, eosinophils relative 2.1 08/16/2019-IgE-less than 2 08/16/2019-hypersensitivity pneumonitis-negative 08/16/2019-allergen profile, mold-negative  09/19/2019-SARS-CoV-2-negative  FENO:  Lab Results  Component Value Date   NITRICOXIDE 13 08/26/2016    PFT: PFT Results Latest Ref Rng & Units 09/22/2019 11/05/2016 08/08/2014  FVC-Pre L 1.33 1.33 1.69  FVC-Predicted Pre % 46 45 55  FVC-Post L 1.43 1.41 1.77  FVC-Predicted Post % 49 47 58  Pre FEV1/FVC % % 73 82 80  Post FEV1/FCV % % 83 85 82  FEV1-Pre L 0.98 1.09 1.35  FEV1-Predicted Pre % 44 47 57  FEV1-Post L 1.19 1.20 1.46  DLCO UNC% % 71 71 57  DLCO COR %Predicted % 108 103 98  TLC L 3.40 3.13 6.68  TLC % Predicted %  73 68 145  RV % Predicted % 100 85 275    WALK:  No flowsheet data found.  Imaging: No results found.  Lab Results:  CBC    Component Value Date/Time   WBC 9.8  08/16/2019 1136   RBC 4.16 08/16/2019 1136   HGB 11.6 (L) 08/16/2019 1136   HGB 10.6 (L) 12/29/2010 1522   HCT 37.1 08/16/2019 1136   HCT 31.2 (L) 12/29/2010 1522   PLT 294.0 08/16/2019 1136   PLT 205 12/29/2010 1522   MCV 89.1 08/16/2019 1136   MCV 94 12/29/2010 1522   MCH 27.3 06/30/2019 0341   MCHC 31.3 08/16/2019 1136   RDW 20.6 (H) 08/16/2019 1136   RDW 12.8 12/29/2010 1522   LYMPHSABS 1.4 08/16/2019 1136   LYMPHSABS 1.1 12/29/2010 1522   MONOABS 0.3 08/16/2019 1136   EOSABS 0.2 08/16/2019 1136   EOSABS 0.2 12/29/2010 1522   BASOSABS 0.1 08/16/2019 1136   BASOSABS 0.0 12/29/2010 1522    BMET    Component Value Date/Time   NA 131 (L) 07/01/2019 0400   K 4.8 07/01/2019 0400   CL 100 07/01/2019 0400   CO2 22 07/01/2019 0400   GLUCOSE 170 (H) 07/01/2019 0400   BUN 46 (H) 07/01/2019 0400   CREATININE 1.29 (H) 07/01/2019 0400   CALCIUM 9.2 07/01/2019 0400   CALCIUM 10.8 (H) 10/13/2011 1405   GFRNONAA 44 (L) 07/01/2019 0400   GFRAA 51 (L) 07/01/2019 0400    BNP    Component Value Date/Time   BNP 224.1 (H) 06/29/2019 1419    ProBNP No results found for: PROBNP  Specialty Problems      Pulmonary Problems   Chronic cough   Bronchitis, mucopurulent recurrent (HCC)   Asthma, mild intermittent   Pneumonia   Influenza B   Persistent asthma with status asthmaticus   Asthma, chronic, unspecified asthma severity, with acute exacerbation   Acute respiratory failure with hypoxia (HCC)      Allergies  Allergen Reactions  . Infed [Iron Dextran] Other (See Comments)    Chest tightness  . Pentamidine Itching, Shortness Of Breath and Swelling  . Erythromycin [Erythromycin] Other (See Comments)    Mouth Ulcers  . Oxycodone Nausea Only and Nausea And Vomiting  . Erythromycin Rash    Causes breakout in mouth  . Ultram [Tramadol Hcl] Anxiety    Immunization History  Administered Date(s) Administered  . Influenza Split 09/23/2013, 08/24/2015  . Influenza,inj,Quad  PF,6+ Mos 09/23/2016  . Influenza-Unspecified 07/27/2011, 09/28/2012, 07/25/2015, 07/24/2017  . Pneumococcal Conjugate-13 11/06/2014  . Pneumococcal Polysaccharide-23 08/14/2002, 05/22/2015    Past Medical History:  Diagnosis Date  . Arthritis   . Asthma   . Depression   . GERD (gastroesophageal reflux disease)   . Hyperlipidemia   . Hyperparathyroidism   . Polycystic kidney   . PONV (postoperative nausea and vomiting)     Tobacco History: Social History   Tobacco Use  Smoking Status Former Smoker  . Packs/day: 0.25  . Years: 15.00  . Pack years: 3.75  . Types: Cigarettes  . Quit date: 03/22/1998  . Years since quitting: 21.5  Smokeless Tobacco Never Used   Counseling given: Yes   Continue to not smoke  Outpatient Encounter Medications as of 09/22/2019  Medication Sig  . acyclovir (ZOVIRAX) 400 MG tablet Take 400 mg by mouth 2 (two) times daily as needed (outbreaks).   [name] albuterol (PROVENTIL HFA;VENTOLIN HFA) 108 (90 Base) MCG/ACT inhaler Inhale 2 puffs into the lungs every 4 (  four) hours as needed for wheezing or shortness of breath.  [name] albuterol (PROVENTIL) (2.5 MG/3ML) 0.083% nebulizer solution Take 3 mLs (2.5 mg total) by nebulization 3 (three) times daily as needed for wheezing or shortness of breath.  [name] alendronate (FOSAMAX) 70 MG tablet Take 70 mg by mouth every 7 (seven) days.  [name] allopurinol (ZYLOPRIM) 300 MG tablet Take 300 mg by mouth daily.  [name] aspirin EC 81 MG tablet Take 81 mg by mouth daily.  . brimonidine (ALPHAGAN P) 0.1 % SOLN Place 1 drop 2 (two) times daily into the left eye.  . cholecalciferol (VITAMIN D3) 25 MCG (1000 UT) tablet Take 1,000 Units by mouth daily.   . Colchicine 0.6 MG CAPS Take 0.6 mg by mouth daily as needed (gout).   . cycloSPORINE (RESTASIS) 0.05 % ophthalmic emulsion Place 1 drop into both eyes 2 (two) times daily.  . dorzolamide (TRUSOPT) 2 % ophthalmic solution Place 1 drop into both eyes daily.  [name] FLUoxetine (PROZAC) 40 MG  capsule Take 40 mg by mouth daily.  . furosemide (LASIX) 40 MG tablet Take 1 tablet by mouth daily.   [name] latanoprost (XALATAN) 0.005 % ophthalmic solution Place 1 drop into both eyes at bedtime.  . magnesium oxide (MAG-OX) 400 MG tablet Take 400 mg by mouth 2 (two) times daily.  . mycophenolate (MYFORTIC) 180 MG EC tablet Take 180 mg by mouth 2 (two) times daily.  . pantoprazole (PROTONIX) 40 MG tablet Take 40 mg by mouth daily.   . prednisoLONE acetate (PRED FORTE) 1 % ophthalmic suspension Place 1 drop into both eyes See admin instructions. Instill 1 drop left eye four times daily and 1 drop right eye once daily.  . predniSONE (DELTASONE) 5 MG tablet Take 7.5 mg by mouth daily.   [name] PROGRAF 1 MG capsule Take 1 mg by mouth daily.  . SYMBICORT 80-4.5 MCG/ACT inhaler Inhale 2 puffs into the lungs 2 (two) times a day.   . tacrolimus (PROGRAF) 0.5 MG capsule Take 0.5 mg by mouth at bedtime. Takes brand name Prograf  . TRAVATAN Z 0.004 % SOLN ophthalmic solution Place 1 drop into both eyes at bedtime.  . vitamin B-12 (CYANOCOBALAMIN) 100 MCG tablet Take 100 mcg by mouth daily.   No facility-administered encounter medications on file as of 09/22/2019.      Review of Systems  Review of Systems  Constitutional: Positive for fatigue. Negative for activity change and fever.  HENT: Negative for sinus pressure, sinus pain and sore throat.   Respiratory: Positive for shortness of breath. Negative for cough and wheezing.   Cardiovascular: Negative for chest pain and palpitations.  Musculoskeletal: Negative for arthralgias.  Neurological: Negative for dizziness.  Psychiatric/Behavioral: Negative for sleep disturbance. The patient is not nervous/anxious.      Physical Exam  BP 106/62 (BP Location: Left Arm, Patient Position: Sitting, Cuff Size: Normal)   Pulse 88   Temp (!) 97 F (36.1 C) (Temporal)   Ht 5\' 1"  (1.549 m)   Wt 175 lb (79.4 kg)   SpO2 93%   BMI 33.07 kg/m   Wt Readings from  Last 5 Encounters:  09/22/19 175 lb (79.4 kg)  08/16/19 175 lb (79.4 kg)  06/30/19 173 lb 11.6 oz (78.8 kg)  04/23/19 176 lb 2.4 oz (79.9 kg)  03/31/19 182 lb (82.6 kg)    BMI Readings from Last 5 Encounters:  09/22/19 33.07 kg/m  08/16/19 33.07 kg/m  06/30/19 32.82 kg/m  04/23/19 32.22 kg/m  03/31/19 33.29  kg/m     Physical Exam Vitals signs and nursing note reviewed.  Constitutional:      General: She is not in acute distress.    Appearance: Normal appearance. She is obese.     Comments: Chronically ill elderly female  HENT:     Head: Normocephalic and atraumatic.     Right Ear: Tympanic membrane, ear canal and external ear normal. There is no impacted cerumen.     Left Ear: Tympanic membrane, ear canal and external ear normal. There is no impacted cerumen.     Nose: Nose normal. No congestion or rhinorrhea.     Mouth/Throat:     Mouth: Mucous membranes are moist.     Pharynx: Oropharynx is clear.  Eyes:     Pupils: Pupils are equal, round, and reactive to light.  Neck:     Musculoskeletal: Normal range of motion.  Cardiovascular:     Rate and Rhythm: Normal rate and regular rhythm.     Pulses: Normal pulses.     Heart sounds: Normal heart sounds. No murmur.  Pulmonary:     Effort: Pulmonary effort is normal. No respiratory distress.     Breath sounds: Normal breath sounds. No decreased air movement. No decreased breath sounds, wheezing or rales.  Abdominal:     General: Abdomen is flat. Bowel sounds are normal.     Palpations: Abdomen is soft.  Skin:    General: Skin is warm and dry.     Capillary Refill: Capillary refill takes less than 2 seconds.  Neurological:     General: No focal deficit present.     Mental Status: She is alert and oriented to person, place, and time. Mental status is at baseline.     Gait: Gait abnormal (Walks with walker).  Psychiatric:        Mood and Affect: Mood normal.        Behavior: Behavior normal.        Thought Content:  Thought content normal.        Judgment: Judgment normal.       Assessment & Plan:   Asthma, mild intermittent Plan: Continue Symbicort 80 Continue use rescue inhaler as needed Schedule clinical pharmacy appointment to review inhaler use Follow-up with our office with an appointment with Dr. Vaughan Browner in 6 weeks Continue to increase physical activity   GERD (gastroesophageal reflux disease) Plan: Continue Protonix Continue follow-up with primary care  End stage renal disease ([place]) Plan: Continue follow-up with nephrology   Medication management Plan: Schedule clinical pharmacy appointment for inhaler education/HFA adherence  Physical deconditioning Plan: Continue home physical therapy Discussed your concerns with outpatient physical therapy with primary care See if they can continue home physical therapy to increase her overall physical activity  Renal transplant recipient Plan: Continue medications as ordered by nephrology    Return in about 6 weeks (around 11/03/2019), or if symptoms worsen or fail to improve, for Follow up with Dr. Vaughan Browner.   Lauraine Rinne, NP 09/22/2019   This appointment was 28 minutes long with over 50% of the time in direct face-to-face patient care, assessment, plan of care, and follow-up.

## 2019-09-22 ENCOUNTER — Ambulatory Visit (INDEPENDENT_AMBULATORY_CARE_PROVIDER_SITE_OTHER): Payer: Medicare Other | Admitting: Pulmonary Disease

## 2019-09-22 ENCOUNTER — Other Ambulatory Visit: Payer: Self-pay

## 2019-09-22 ENCOUNTER — Encounter: Payer: Self-pay | Admitting: Pulmonary Disease

## 2019-09-22 VITALS — BP 106/62 | HR 88 | Temp 97.0°F | Ht 61.0 in | Wt 175.0 lb

## 2019-09-22 DIAGNOSIS — Z940 Kidney transplant status: Secondary | ICD-10-CM

## 2019-09-22 DIAGNOSIS — Z79899 Other long term (current) drug therapy: Secondary | ICD-10-CM

## 2019-09-22 DIAGNOSIS — N186 End stage renal disease: Secondary | ICD-10-CM | POA: Diagnosis not present

## 2019-09-22 DIAGNOSIS — J45901 Unspecified asthma with (acute) exacerbation: Secondary | ICD-10-CM | POA: Diagnosis not present

## 2019-09-22 DIAGNOSIS — K219 Gastro-esophageal reflux disease without esophagitis: Secondary | ICD-10-CM | POA: Diagnosis not present

## 2019-09-22 DIAGNOSIS — J4520 Mild intermittent asthma, uncomplicated: Secondary | ICD-10-CM

## 2019-09-22 DIAGNOSIS — R5381 Other malaise: Secondary | ICD-10-CM | POA: Insufficient documentation

## 2019-09-22 LAB — PULMONARY FUNCTION TEST
DL/VA % pred: 108 %
DL/VA: 4.66 ml/min/mmHg/L
DLCO cor % pred: 75 %
DLCO cor: 13.73 ml/min/mmHg
DLCO unc % pred: 71 %
DLCO unc: 12.9 ml/min/mmHg
FEF 25-75 Post: 1.33 L/sec
FEF 25-75 Pre: 0.68 L/sec
FEF2575-%Change-Post: 93 %
FEF2575-%Pred-Post: 63 %
FEF2575-%Pred-Pre: 32 %
FEV1-%Change-Post: 21 %
FEV1-%Pred-Post: 53 %
FEV1-%Pred-Pre: 44 %
FEV1-Post: 1.19 L
FEV1-Pre: 0.98 L
FEV1FVC-%Change-Post: 13 %
FEV1FVC-%Pred-Pre: 94 %
FEV6-%Change-Post: 7 %
FEV6-%Pred-Post: 51 %
FEV6-%Pred-Pre: 47 %
FEV6-Post: 1.43 L
FEV6-Pre: 1.33 L
FEV6FVC-%Pred-Post: 103 %
FEV6FVC-%Pred-Pre: 103 %
FVC-%Change-Post: 7 %
FVC-%Pred-Post: 49 %
FVC-%Pred-Pre: 46 %
FVC-Post: 1.43 L
FVC-Pre: 1.33 L
Post FEV1/FVC ratio: 83 %
Post FEV6/FVC ratio: 100 %
Pre FEV1/FVC ratio: 73 %
Pre FEV6/FVC Ratio: 100 %
RV % pred: 100 %
RV: 1.88 L
TLC % pred: 73 %
TLC: 3.4 L

## 2019-09-22 NOTE — Assessment & Plan Note (Signed)
Plan: Continue follow-up with nephrology

## 2019-09-22 NOTE — Assessment & Plan Note (Signed)
Plan: Continue home physical therapy Discussed your concerns with outpatient physical therapy with primary care See if they can continue home physical therapy to increase her overall physical activity

## 2019-09-22 NOTE — Assessment & Plan Note (Addendum)
Plan: Continue Symbicort 80 Continue use rescue inhaler as needed Schedule clinical pharmacy appointment to review inhaler use Follow-up with our office with an appointment with Dr. Vaughan Browner in 6 weeks Continue to increase physical activity

## 2019-09-22 NOTE — Progress Notes (Signed)
Full PFT performed today. °

## 2019-09-22 NOTE — Assessment & Plan Note (Signed)
Plan: Continue medications as ordered by nephrology

## 2019-09-22 NOTE — Assessment & Plan Note (Signed)
Plan: Schedule clinical pharmacy appointment for inhaler education/HFA adherence

## 2019-09-22 NOTE — Assessment & Plan Note (Signed)
Plan: Continue Protonix Continue follow-up with primary care

## 2019-09-22 NOTE — Patient Instructions (Addendum)
You were seen today by Lauraine Rinne, NP  for:   1. Mild intermittent asthma without complication  Continue Symbicort >>> 2 puffs in the morning right when you wake up, rinse out your mouth after use, 12 hours later 2 puffs, rinse after use >>> Take this daily, no matter what >>> This is not a rescue inhaler   Only use your albuterol as a rescue medication to be used if you can't catch your breath by resting or doing a relaxed purse lip breathing pattern.  - The less you use it, the better it will work when you need it. - Ok to use up to 2 puffs  every 4 hours if you must but call for immediate appointment if use goes up over your usual need - Don't leave home without it !!  (think of it like the spare tire for your car)   Continue to work on increasing physical activity  Would recommend that you return to our office for a clinical pharmacy team visit to review inhaler education and use  2. Gastroesophageal reflux disease, unspecified whether esophagitis present  Continue Protonix  GERD management: >>>Avoid laying flat until 2 hours after meals >>>Elevate head of the bed including entire chest >>>Reduce size of meals and amount of fat, acid, spices, caffeine and sweets >>>If you are smoking, Please stop! >>>Decrease alcohol consumption >>>Work on maintaining a healthy weight with normal BMI    3. End stage renal disease ([place])  Continue follow-up with nephrology  4. Medication management  Would recommend that you return to our office for a clinical pharmacy team appointment this can be scheduled at your discretion  5. Physical deconditioning  Follow-up with primary care regarding home physical therapy as well as potential need for home health referral to increase overall physical activity    Follow Up:    Return in about 6 weeks (around 11/03/2019), or if symptoms worsen or fail to improve, for Follow up with Dr. Vaughan Browner.  Please schedule an additional appointment with  clinical pharmacy team.  They are here Monday mornings as well as Fridays.  This can be on the same day as your follow-up with Dr. Vaughan Browner or earlier at your convenience.  Please do your part to reduce the spread of COVID-19:      Reduce your risk of any infection  and COVID19 by using the similar precautions used for avoiding the common cold or flu:  [name] Wash your hands often with soap and warm water for at least 20 seconds.  If soap and water are not readily available, use an alcohol-based hand sanitizer with at least 60% alcohol.  . If coughing or sneezing, cover your mouth and nose by coughing or sneezing into the elbow areas of your shirt or coat, into a tissue or into your sleeve (not your hands). Langley Gauss A MASK when in public  . Avoid shaking hands with others and consider head nods or verbal greetings only. . Avoid touching your eyes, nose, or mouth with unwashed hands.  . Avoid close contact with people who are sick. . Avoid places or events with large numbers of people in one location, like concerts or sporting events. . If you have some symptoms but not all symptoms, continue to monitor at home and seek medical attention if your symptoms worsen. . If you are having a medical emergency, call 911.   ADDITIONAL HEALTHCARE OPTIONS FOR PATIENTS  [facility] / e-Visit: [email or web address]  [facility]: [facility]: [phone]                   [facility]: [phone]     It is flu season:   >>> Best ways to protect herself from the flu: Receive the yearly flu vaccine, practice good hand hygiene washing with soap and also using hand sanitizer when available, eat a nutritious meals, get adequate rest, hydrate appropriately   Please contact the office if your symptoms worsen or you have concerns that you are not improving.   Thank you for choosing [facility] for your healthcare, and for allowing [place] to partner with you on your healthcare journey. I am thankful to be able to provide care to you today.   Wyn Quaker FNP-C

## 2019-10-12 ENCOUNTER — Telehealth: Payer: Self-pay

## 2019-10-12 NOTE — Telephone Encounter (Signed)
Left message for patient to call back to get scheduled.  

## 2019-10-12 NOTE — Telephone Encounter (Signed)
-----   Message from Valerie Salts, [place] sent at 09/22/2019  2:49 PM EDT ----- Regarding: Schedule Schedule patient with PM and pharmacy around December 11th.

## 2019-10-16 IMAGING — US US RENAL
2 series · 13 of 25 positions shown · non-contrast
Comparison: CT Abdomen and Pelvis 05/15/2013.

CLINICAL DATA: 62-year-old female with acute kidney injury.
Polycystic renal disease, renal transplant.

EXAM:
RENAL / URINARY TRACT ULTRASOUND COMPLETE

[Series 1: us renal · 0.23mm/px · 11 of 34 slices shown (1 of 2)]
[im 1/34]
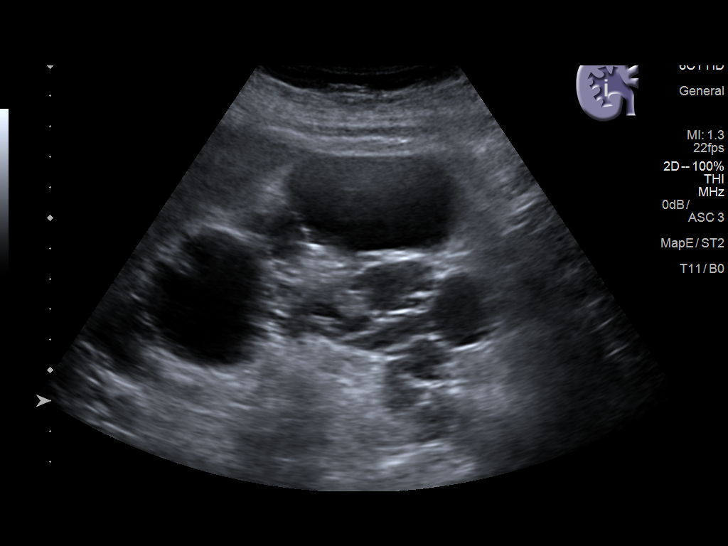
[im 4/34]
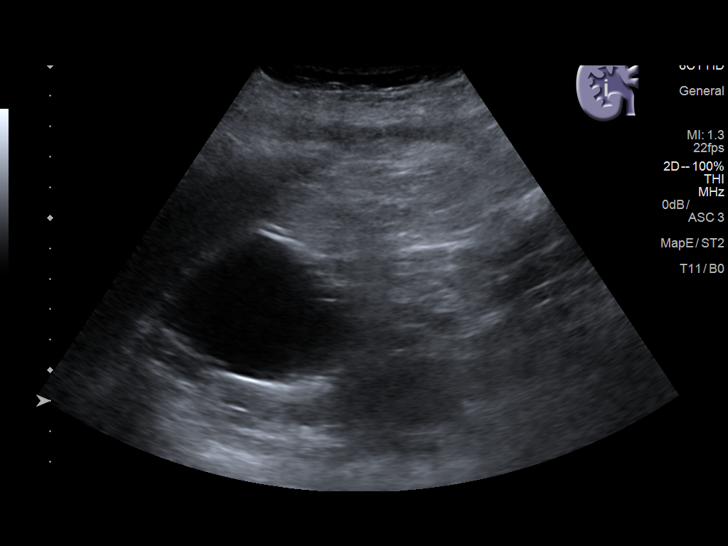
[im 7/34]
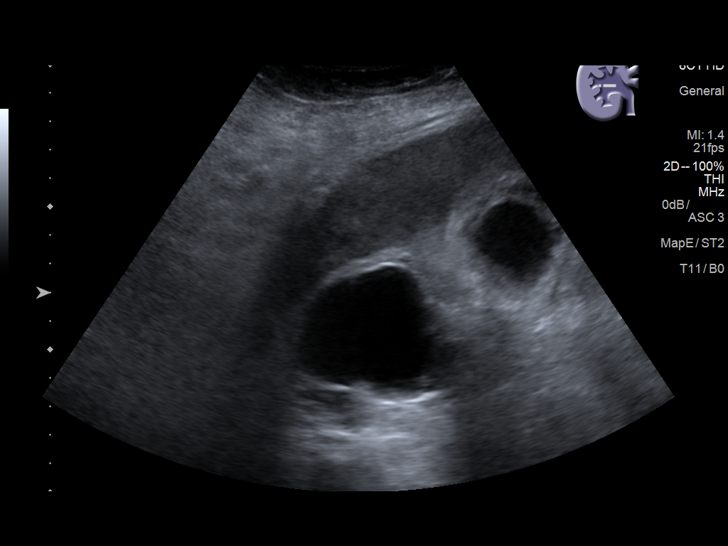
[im 10/34]
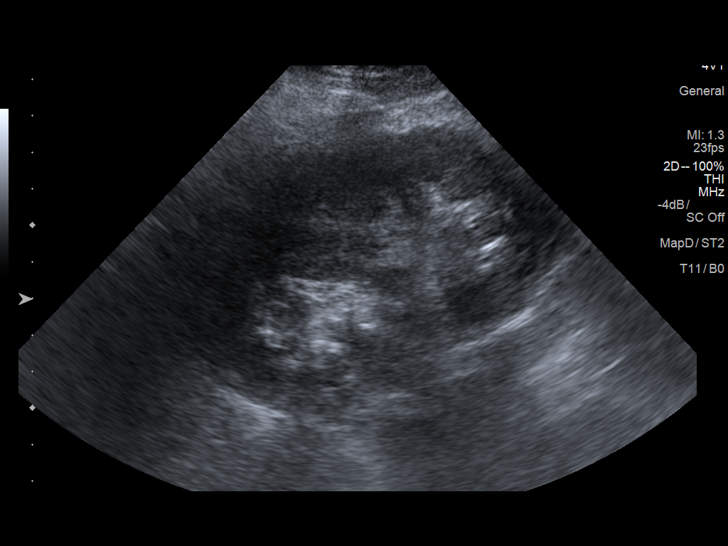
[im 13/34]
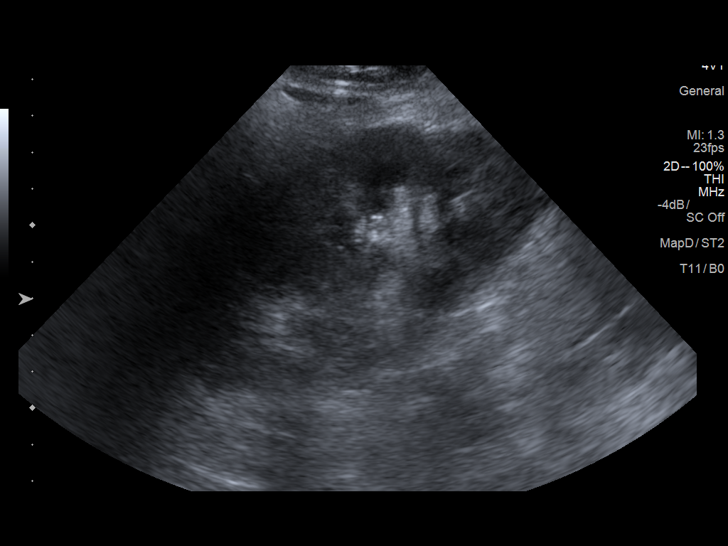
[im 16/34]
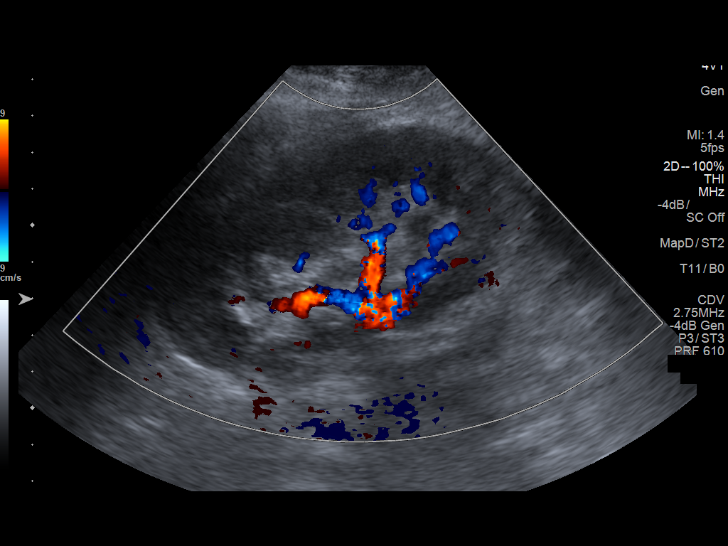
[im 19/34]
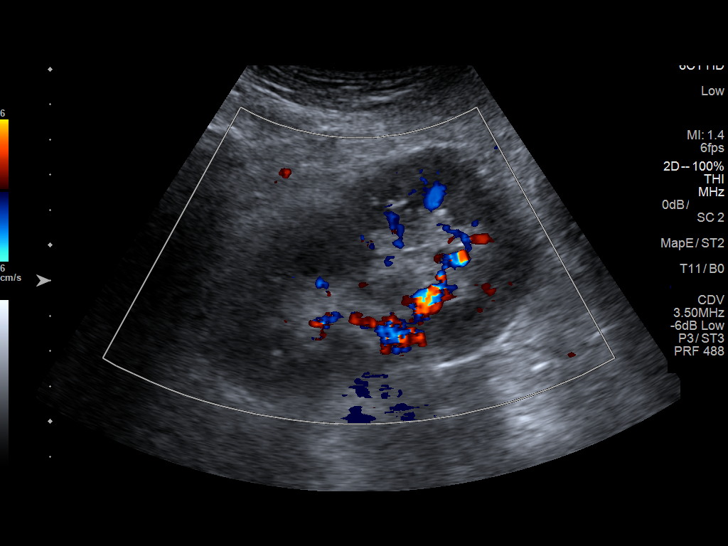
[im 23/34]
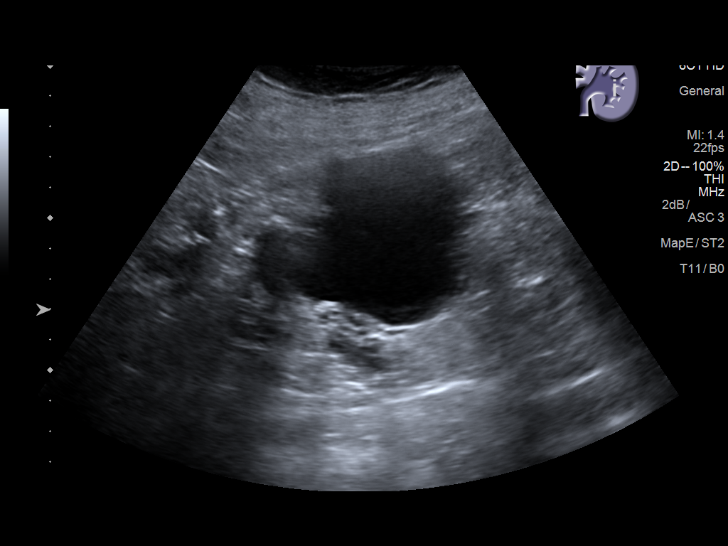
[im 26/34]
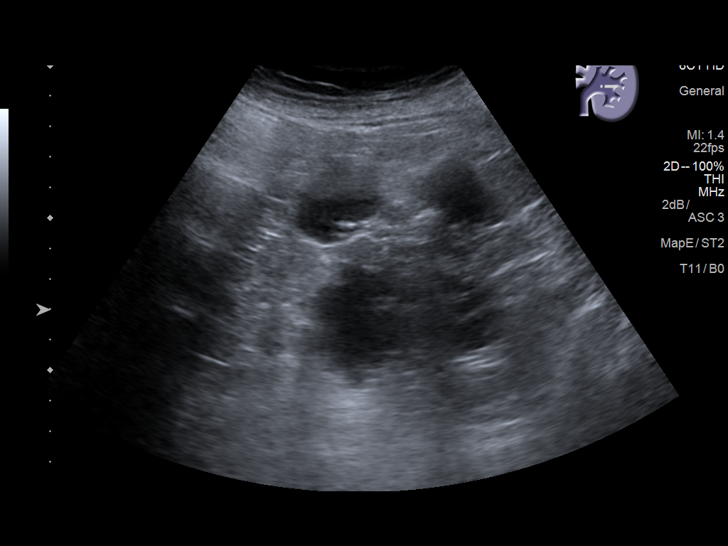
[im 29/34]
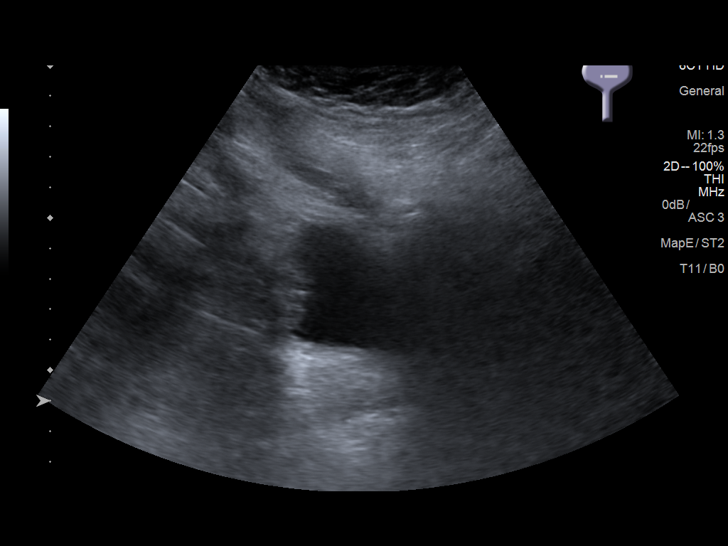
[im 32/34]
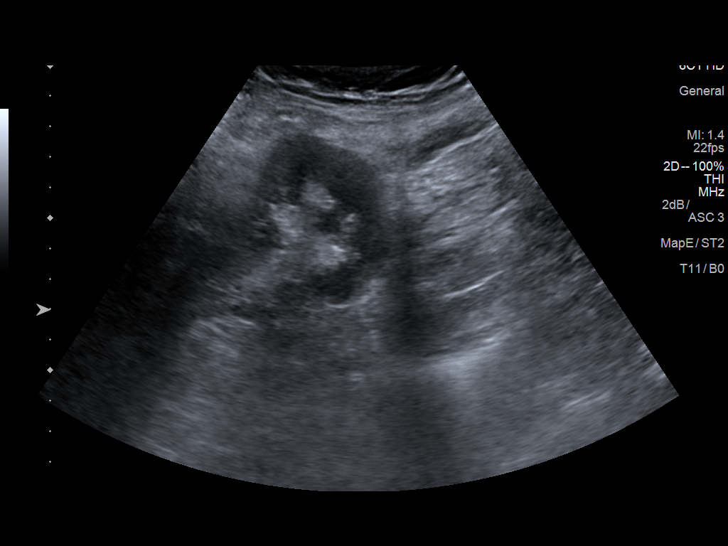

[Series 2: us renal · 0.23mm/px · 2 of 5 slices shown (2 of 2)]
[im 1/5]
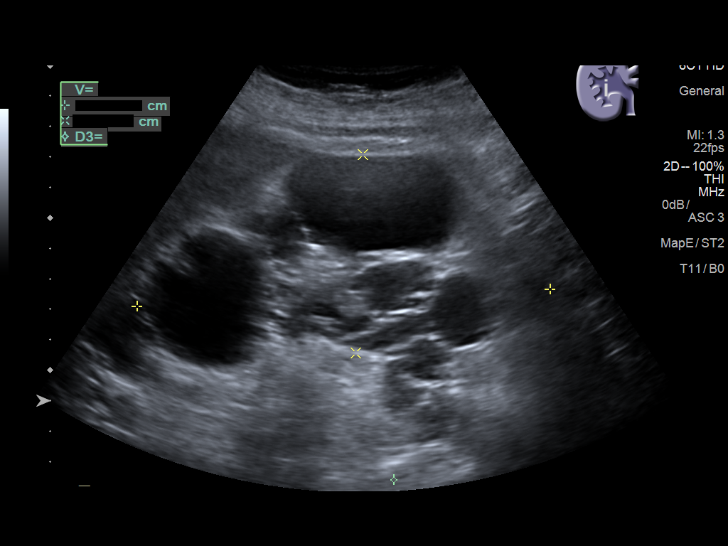
[im 5/5]
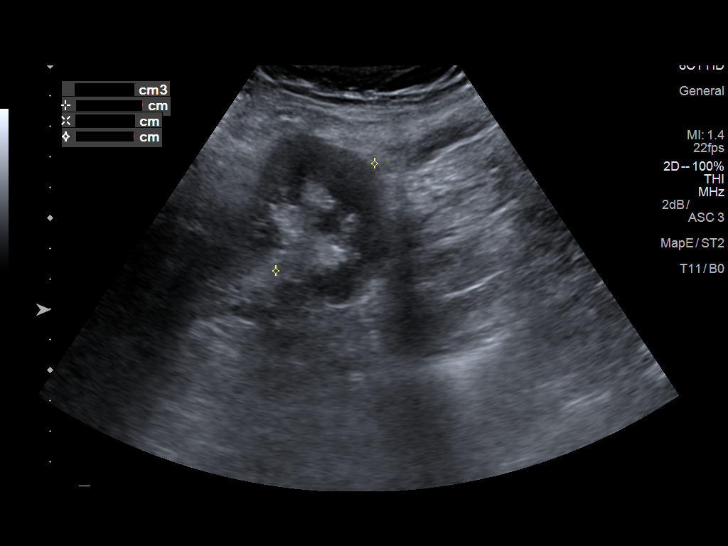

[13 of 25 positions shown; findings below may reference images not displayed]

FINDINGS: Right Kidney:

Renal measurements: 13.6 x 6.5 x 7.5 centimeters = volume: 345 mL.
Virtually no normal right renal parenchyma is identified. Numerous
renal cysts, more than 10 individually up to 6.1 centimeters
diameter. No right hydronephrosis suspected.

Left Kidney:

Renal measurements: 15.4 x 8.8 x 9.3 centimeters = volume: 659 mL.
Virtually no normal left renal parenchyma, with more than 10 left
renal cysts individually up to 6.6 centimeters diameter. No left
hydronephrosis suspected.

Bladder:

Appears normal for degree of bladder distention.

Other findings: Right pelvis renal transplant with normal cortical
echogenicity, no transplant hydronephrosis. The transplant kidney
measures 10.6 x 6.3 x 4.8 centimeters (volume 168 milliliters).
IMPRESSION: 1. Renal transplant in the right pelvis with no adverse features.
2. Native polycystic kidney disease with no normal native renal
parenchyma identified.
3. Unremarkable urinary bladder.

## 2019-10-24 NOTE — Telephone Encounter (Signed)
Left message for patient to call back. I will send a letter to patient.   Nothing further needed at time of call.

## 2019-12-07 ENCOUNTER — Inpatient Hospital Stay (HOSPITAL_COMMUNITY): Payer: Medicare Other

## 2019-12-07 ENCOUNTER — Emergency Department (HOSPITAL_COMMUNITY): Payer: Medicare Other

## 2019-12-07 ENCOUNTER — Encounter (HOSPITAL_COMMUNITY): Payer: Self-pay

## 2019-12-07 ENCOUNTER — Inpatient Hospital Stay (HOSPITAL_COMMUNITY)
Admission: EM | Admit: 2019-12-07 | Discharge: 2019-12-15 | DRG: 522 | Disposition: A | Discharge status: Rehabilitation Facility | Payer: Medicare Other | Attending: Internal Medicine | Admitting: Internal Medicine

## 2019-12-07 ENCOUNTER — Other Ambulatory Visit: Payer: Self-pay

## 2019-12-07 DIAGNOSIS — Z7951 Long term (current) use of inhaled steroids: Secondary | ICD-10-CM

## 2019-12-07 DIAGNOSIS — W010XXA Fall on same level from slipping, tripping and stumbling without subsequent striking against object, initial encounter: Secondary | ICD-10-CM | POA: Diagnosis present

## 2019-12-07 DIAGNOSIS — D62 Acute posthemorrhagic anemia: Secondary | ICD-10-CM

## 2019-12-07 DIAGNOSIS — R52 Pain, unspecified: Secondary | ICD-10-CM

## 2019-12-07 DIAGNOSIS — I5032 Chronic diastolic (congestive) heart failure: Secondary | ICD-10-CM | POA: Diagnosis not present

## 2019-12-07 DIAGNOSIS — Z881 Allergy status to other antibiotic agents status: Secondary | ICD-10-CM | POA: Diagnosis not present

## 2019-12-07 DIAGNOSIS — S51812A Laceration without foreign body of left forearm, initial encounter: Secondary | ICD-10-CM | POA: Diagnosis present

## 2019-12-07 DIAGNOSIS — Z888 Allergy status to other drugs, medicaments and biological substances status: Secondary | ICD-10-CM

## 2019-12-07 DIAGNOSIS — Z8271 Family history of polycystic kidney: Secondary | ICD-10-CM | POA: Diagnosis not present

## 2019-12-07 DIAGNOSIS — Y92010 Kitchen of single-family (private) house as the place of occurrence of the external cause: Secondary | ICD-10-CM | POA: Diagnosis not present

## 2019-12-07 DIAGNOSIS — S72112A Displaced fracture of greater trochanter of left femur, initial encounter for closed fracture: Secondary | ICD-10-CM | POA: Diagnosis present

## 2019-12-07 DIAGNOSIS — Z8249 Family history of ischemic heart disease and other diseases of the circulatory system: Secondary | ICD-10-CM

## 2019-12-07 DIAGNOSIS — E785 Hyperlipidemia, unspecified: Secondary | ICD-10-CM | POA: Diagnosis present

## 2019-12-07 DIAGNOSIS — E213 Hyperparathyroidism, unspecified: Secondary | ICD-10-CM | POA: Diagnosis present

## 2019-12-07 DIAGNOSIS — Z7983 Long term (current) use of bisphosphonates: Secondary | ICD-10-CM | POA: Diagnosis not present

## 2019-12-07 DIAGNOSIS — R0902 Hypoxemia: Secondary | ICD-10-CM | POA: Diagnosis not present

## 2019-12-07 DIAGNOSIS — Z20822 Contact with and (suspected) exposure to covid-19: Secondary | ICD-10-CM | POA: Diagnosis present

## 2019-12-07 DIAGNOSIS — Z96642 Presence of left artificial hip joint: Secondary | ICD-10-CM | POA: Diagnosis not present

## 2019-12-07 DIAGNOSIS — Y830 Surgical operation with transplant of whole organ as the cause of abnormal reaction of the patient, or of later complication, without mention of misadventure at the time of the procedure: Secondary | ICD-10-CM | POA: Diagnosis not present

## 2019-12-07 DIAGNOSIS — E782 Mixed hyperlipidemia: Secondary | ICD-10-CM | POA: Diagnosis present

## 2019-12-07 DIAGNOSIS — M109 Gout, unspecified: Secondary | ICD-10-CM | POA: Diagnosis present

## 2019-12-07 DIAGNOSIS — F411 Generalized anxiety disorder: Secondary | ICD-10-CM | POA: Diagnosis not present

## 2019-12-07 DIAGNOSIS — S72002A Fracture of unspecified part of neck of left femur, initial encounter for closed fracture: Secondary | ICD-10-CM | POA: Diagnosis not present

## 2019-12-07 DIAGNOSIS — N186 End stage renal disease: Secondary | ICD-10-CM | POA: Diagnosis not present

## 2019-12-07 DIAGNOSIS — S62102A Fracture of unspecified carpal bone, left wrist, initial encounter for closed fracture: Secondary | ICD-10-CM | POA: Diagnosis not present

## 2019-12-07 DIAGNOSIS — Z79899 Other long term (current) drug therapy: Secondary | ICD-10-CM

## 2019-12-07 DIAGNOSIS — T148XXA Other injury of unspecified body region, initial encounter: Secondary | ICD-10-CM | POA: Diagnosis present

## 2019-12-07 DIAGNOSIS — Z885 Allergy status to narcotic agent status: Secondary | ICD-10-CM

## 2019-12-07 DIAGNOSIS — S32512S Fracture of superior rim of left pubis, sequela: Secondary | ICD-10-CM | POA: Diagnosis not present

## 2019-12-07 DIAGNOSIS — Q613 Polycystic kidney, unspecified: Secondary | ICD-10-CM

## 2019-12-07 DIAGNOSIS — Z940 Kidney transplant status: Secondary | ICD-10-CM

## 2019-12-07 DIAGNOSIS — Z7982 Long term (current) use of aspirin: Secondary | ICD-10-CM | POA: Diagnosis not present

## 2019-12-07 DIAGNOSIS — Z87891 Personal history of nicotine dependence: Secondary | ICD-10-CM | POA: Diagnosis not present

## 2019-12-07 DIAGNOSIS — N179 Acute kidney failure, unspecified: Secondary | ICD-10-CM | POA: Diagnosis not present

## 2019-12-07 DIAGNOSIS — M1990 Unspecified osteoarthritis, unspecified site: Secondary | ICD-10-CM | POA: Diagnosis present

## 2019-12-07 DIAGNOSIS — S52572A Other intraarticular fracture of lower end of left radius, initial encounter for closed fracture: Secondary | ICD-10-CM | POA: Diagnosis present

## 2019-12-07 DIAGNOSIS — K219 Gastro-esophageal reflux disease without esophagitis: Secondary | ICD-10-CM | POA: Diagnosis present

## 2019-12-07 DIAGNOSIS — G8918 Other acute postprocedural pain: Secondary | ICD-10-CM | POA: Diagnosis not present

## 2019-12-07 DIAGNOSIS — Z419 Encounter for procedure for purposes other than remedying health state, unspecified: Secondary | ICD-10-CM

## 2019-12-07 DIAGNOSIS — T8619 Other complication of kidney transplant: Secondary | ICD-10-CM | POA: Diagnosis present

## 2019-12-07 DIAGNOSIS — J4520 Mild intermittent asthma, uncomplicated: Secondary | ICD-10-CM | POA: Diagnosis present

## 2019-12-07 DIAGNOSIS — D72829 Elevated white blood cell count, unspecified: Secondary | ICD-10-CM

## 2019-12-07 DIAGNOSIS — E860 Dehydration: Secondary | ICD-10-CM | POA: Diagnosis present

## 2019-12-07 LAB — BASIC METABOLIC PANEL
Anion gap: 8 (ref 5–15)
BUN: 39 mg/dL — ABNORMAL HIGH (ref 8–23)
CO2: 25 mmol/L (ref 22–32)
Calcium: 9.6 mg/dL (ref 8.9–10.3)
Chloride: 107 mmol/L (ref 98–111)
Creatinine, Ser: 1.02 mg/dL — ABNORMAL HIGH (ref 0.44–1.00)
GFR calc Af Amer: >60 mL/min (ref >60)
GFR calc non Af Amer: 59 mL/min — ABNORMAL LOW (ref >60)
Glucose, Bld: 113 mg/dL — ABNORMAL HIGH (ref 70–99)
Potassium: 4.3 mmol/L (ref 3.5–5.1)
Sodium: 140 mmol/L (ref 135–145)

## 2019-12-07 LAB — CBC WITH DIFFERENTIAL/PLATELET
Abs Immature Granulocytes: 0.19 10*3/uL — ABNORMAL HIGH (ref 0.00–0.07)
Basophils Absolute: 0 10*3/uL (ref 0.0–0.1)
Basophils Relative: 0 %
Eosinophils Absolute: 0 10*3/uL (ref 0.0–0.5)
Eosinophils Relative: 0 %
HCT: 38 % (ref 36.0–46.0)
Hemoglobin: 10.8 g/dL — ABNORMAL LOW (ref 12.0–15.0)
Immature Granulocytes: 2 %
Lymphocytes Relative: 6 %
Lymphs Abs: 0.5 10*3/uL — ABNORMAL LOW (ref 0.7–4.0)
MCH: 27.3 pg (ref 26.0–34.0)
MCHC: 28.4 g/dL — ABNORMAL LOW (ref 30.0–36.0)
MCV: 96.2 fL (ref 80.0–100.0)
Monocytes Absolute: 0.3 10*3/uL (ref 0.1–1.0)
Monocytes Relative: 4 %
Neutro Abs: 7.6 10*3/uL (ref 1.7–7.7)
Neutrophils Relative %: 88 %
Platelets: 172 10*3/uL (ref 150–400)
RBC: 3.95 MIL/uL (ref 3.87–5.11)
RDW: 18.5 % — ABNORMAL HIGH (ref 11.5–15.5)
WBC: 8.7 10*3/uL (ref 4.0–10.5)
nRBC: 0 % (ref 0.0–0.2)

## 2019-12-07 LAB — RESPIRATORY PANEL BY RT PCR (FLU A&B, COVID)
Influenza A by PCR: NEGATIVE
Influenza B by PCR: NEGATIVE
SARS Coronavirus 2 by RT PCR: NEGATIVE

## 2019-12-07 LAB — MRSA PCR SCREENING: MRSA by PCR: NEGATIVE

## 2019-12-07 MED ORDER — ONDANSETRON HCL 4 MG PO TABS
4.0000 mg | ORAL_TABLET | Freq: Four times a day (QID) | ORAL | Status: DC | PRN
  Administered 2019-12-07: 4 mg via ORAL
  Filled 2019-12-07: qty 1

## 2019-12-07 MED ORDER — METHOCARBAMOL 500 MG PO TABS
500.0000 mg | ORAL_TABLET | Freq: Four times a day (QID) | ORAL | Status: DC | PRN
  Administered 2019-12-08 – 2019-12-14 (×11): 500 mg via ORAL
  Filled 2019-12-07 (×11): qty 1

## 2019-12-07 MED ORDER — PREDNISONE 20 MG PO TABS
20.0000 mg | ORAL_TABLET | Freq: Once | ORAL | Status: AC
  Administered 2019-12-07: 20 mg via ORAL
  Filled 2019-12-07: qty 1

## 2019-12-07 MED ORDER — ALLOPURINOL 300 MG PO TABS
300.0000 mg | ORAL_TABLET | Freq: Every day | ORAL | Status: DC
  Administered 2019-12-07 – 2019-12-15 (×8): 300 mg via ORAL
  Filled 2019-12-07 (×8): qty 1

## 2019-12-07 MED ORDER — PREDNISONE 5 MG PO TABS
7.5000 mg | ORAL_TABLET | Freq: Every day | ORAL | Status: DC
  Administered 2019-12-08 – 2019-12-15 (×8): 7.5 mg via ORAL
  Filled 2019-12-07 (×8): qty 2

## 2019-12-07 MED ORDER — DOCUSATE SODIUM 100 MG PO CAPS
100.0000 mg | ORAL_CAPSULE | Freq: Two times a day (BID) | ORAL | Status: DC
  Administered 2019-12-07: 100 mg via ORAL
  Filled 2019-12-07: qty 1

## 2019-12-07 MED ORDER — MOMETASONE FURO-FORMOTEROL FUM 200-5 MCG/ACT IN AERO
2.0000 | INHALATION_SPRAY | Freq: Two times a day (BID) | RESPIRATORY_TRACT | Status: DC
  Administered 2019-12-07 – 2019-12-15 (×16): 2 via RESPIRATORY_TRACT
  Filled 2019-12-07: qty 8.8

## 2019-12-07 MED ORDER — POLYETHYLENE GLYCOL 3350 17 G PO PACK
17.0000 g | PACK | Freq: Every day | ORAL | Status: DC | PRN
  Administered 2019-12-11 – 2019-12-13 (×2): 17 g via ORAL
  Filled 2019-12-07 (×2): qty 1

## 2019-12-07 MED ORDER — TACROLIMUS 1 MG PO CAPS
1.0000 mg | ORAL_CAPSULE | Freq: Every day | ORAL | Status: DC
  Administered 2019-12-09 – 2019-12-15 (×7): 1 mg via ORAL
  Filled 2019-12-07 (×9): qty 1

## 2019-12-07 MED ORDER — LIDOCAINE HCL (PF) 1 % IJ SOLN
5.0000 mL | Freq: Once | INTRAMUSCULAR | Status: AC
  Administered 2019-12-07: 14:00:00 5 mL
  Filled 2019-12-07: qty 30

## 2019-12-07 MED ORDER — PANTOPRAZOLE SODIUM 40 MG PO TBEC
40.0000 mg | DELAYED_RELEASE_TABLET | Freq: Every day | ORAL | Status: DC
  Administered 2019-12-07 – 2019-12-15 (×8): 40 mg via ORAL
  Filled 2019-12-07 (×8): qty 1

## 2019-12-07 MED ORDER — FENTANYL CITRATE (PF) 100 MCG/2ML IJ SOLN
100.0000 ug | Freq: Once | INTRAMUSCULAR | Status: AC
  Administered 2019-12-07: 100 ug via INTRAVENOUS
  Filled 2019-12-07: qty 2

## 2019-12-07 MED ORDER — VITAMIN D 25 MCG (1000 UNIT) PO TABS
1000.0000 [IU] | ORAL_TABLET | Freq: Every day | ORAL | Status: DC
  Administered 2019-12-09 – 2019-12-15 (×7): 1000 [IU] via ORAL
  Filled 2019-12-07 (×7): qty 1

## 2019-12-07 MED ORDER — BACITRACIN ZINC 500 UNIT/GM EX OINT
TOPICAL_OINTMENT | CUTANEOUS | Status: AC
  Filled 2019-12-07: qty 1.8

## 2019-12-07 MED ORDER — LATANOPROST 0.005 % OP SOLN
1.0000 [drp] | Freq: Every day | OPHTHALMIC | Status: DC
  Administered 2019-12-07 – 2019-12-14 (×9): 1 [drp] via OPHTHALMIC
  Filled 2019-12-07: qty 2.5

## 2019-12-07 MED ORDER — TRANEXAMIC ACID-NACL 1000-0.7 MG/100ML-% IV SOLN
1000.0000 mg | INTRAVENOUS | Status: AC
  Administered 2019-12-08: 1000 mg via INTRAVENOUS

## 2019-12-07 MED ORDER — ENSURE PRE-SURGERY PO LIQD
296.0000 mL | Freq: Once | ORAL | Status: DC
  Filled 2019-12-07: qty 296

## 2019-12-07 MED ORDER — POVIDONE-IODINE 10 % EX SWAB: 2.0000 "application " | Freq: Once | CUTANEOUS | Status: DC

## 2019-12-07 MED ORDER — VITAMIN B-12 100 MCG PO TABS
100.0000 ug | ORAL_TABLET | Freq: Every day | ORAL | Status: DC
  Administered 2019-12-09 – 2019-12-15 (×7): 100 ug via ORAL
  Filled 2019-12-07 (×8): qty 1

## 2019-12-07 MED ORDER — MORPHINE SULFATE (PF) 2 MG/ML IV SOLN
0.5000 mg | INTRAVENOUS | Status: DC | PRN
  Administered 2019-12-07 – 2019-12-08 (×3): 0.5 mg via INTRAVENOUS
  Filled 2019-12-07 (×3): qty 1

## 2019-12-07 MED ORDER — CYCLOSPORINE 0.05 % OP EMUL
1.0000 [drp] | Freq: Two times a day (BID) | OPHTHALMIC | Status: DC
  Administered 2019-12-07 – 2019-12-15 (×16): 1 [drp] via OPHTHALMIC
  Filled 2019-12-07 (×17): qty 1

## 2019-12-07 MED ORDER — MYCOPHENOLATE SODIUM 180 MG PO TBEC
180.0000 mg | DELAYED_RELEASE_TABLET | Freq: Two times a day (BID) | ORAL | Status: DC
  Administered 2019-12-07 – 2019-12-15 (×15): 180 mg via ORAL
  Filled 2019-12-07 (×19): qty 1

## 2019-12-07 MED ORDER — TACROLIMUS 0.5 MG PO CAPS
0.5000 mg | ORAL_CAPSULE | Freq: Every day | ORAL | Status: DC
  Administered 2019-12-07 – 2019-12-14 (×8): 0.5 mg via ORAL
  Filled 2019-12-07 (×10): qty 1

## 2019-12-07 MED ORDER — MAGNESIUM OXIDE 400 MG PO TABS
400.0000 mg | ORAL_TABLET | Freq: Two times a day (BID) | ORAL | Status: DC
  Administered 2019-12-07: 400 mg via ORAL
  Filled 2019-12-07 (×3): qty 1

## 2019-12-07 MED ORDER — CEFAZOLIN SODIUM-DEXTROSE 2-4 GM/100ML-% IV SOLN
2.0000 g | INTRAVENOUS | Status: AC
  Administered 2019-12-08: 2 g via INTRAVENOUS
  Filled 2019-12-07 (×2): qty 100

## 2019-12-07 MED ORDER — ASPIRIN EC 81 MG PO TBEC
81.0000 mg | DELAYED_RELEASE_TABLET | Freq: Every day | ORAL | Status: DC
  Administered 2019-12-07: 81 mg via ORAL
  Filled 2019-12-07: qty 1

## 2019-12-07 MED ORDER — ONDANSETRON HCL 4 MG/2ML IJ SOLN: 4.0000 mg | Freq: Four times a day (QID) | INTRAMUSCULAR | Status: DC | PRN

## 2019-12-07 MED ORDER — DORZOLAMIDE HCL 2 % OP SOLN
1.0000 [drp] | Freq: Every day | OPHTHALMIC | Status: DC
  Administered 2019-12-07 – 2019-12-15 (×9): 1 [drp] via OPHTHALMIC
  Filled 2019-12-07: qty 10

## 2019-12-07 MED ORDER — ALBUTEROL SULFATE HFA 108 (90 BASE) MCG/ACT IN AERS: 2.0000 | INHALATION_SPRAY | RESPIRATORY_TRACT | Status: DC | PRN

## 2019-12-07 MED ORDER — BRIMONIDINE TARTRATE 0.15 % OP SOLN
1.0000 [drp] | Freq: Two times a day (BID) | OPHTHALMIC | Status: DC
  Administered 2019-12-07 – 2019-12-15 (×16): 1 [drp] via OPHTHALMIC
  Filled 2019-12-07: qty 5

## 2019-12-07 MED ORDER — METHOCARBAMOL 1000 MG/10ML IJ SOLN
500.0000 mg | Freq: Four times a day (QID) | INTRAVENOUS | Status: DC | PRN
  Filled 2019-12-07: qty 5

## 2019-12-07 MED ORDER — CHLORHEXIDINE GLUCONATE 4 % EX LIQD: 60.0000 mL | Freq: Once | CUTANEOUS | Status: DC

## 2019-12-07 MED ORDER — FENTANYL CITRATE (PF) 100 MCG/2ML IJ SOLN
50.0000 ug | Freq: Once | INTRAMUSCULAR | Status: AC
  Administered 2019-12-07: 50 ug via INTRAVENOUS
  Filled 2019-12-07: qty 2

## 2019-12-07 MED ORDER — HYDROCODONE-ACETAMINOPHEN 5-325 MG PO TABS
1.0000 | ORAL_TABLET | Freq: Four times a day (QID) | ORAL | Status: DC | PRN
  Administered 2019-12-07 – 2019-12-11 (×12): 2 via ORAL
  Filled 2019-12-07 (×12): qty 2

## 2019-12-07 MED ORDER — FLUOXETINE HCL 20 MG PO CAPS
40.0000 mg | ORAL_CAPSULE | Freq: Every day | ORAL | Status: DC
  Administered 2019-12-07 – 2019-12-15 (×8): 40 mg via ORAL
  Filled 2019-12-07 (×8): qty 2

## 2019-12-07 NOTE — H&P (Signed)
History and Physical        Hospital Admission Note Date: 12/07/2019  Patient name: April [name] Medical record number: [id number] Date of birth: Mar 29, 1956 Age: 64 y.o. Gender: female  PCP: [name], MD    Patient coming from: Home  I have reviewed all records in the [place].    Chief Complaint:  Fall with left hip pain, wrist pain today  HPI: Patient is a 64 year old female with history of arthritis, GERD, polycystic kidney disease, ESRD, post renal transplant in 2017, asthma, hyperlipidemia, presented after mechanical fall at home today.  History was obtained from the patient who reported that she was in her kitchen this morning, thinks she tripped over something and landed on her left side.  Subsequently she had severe pain to the left wrist and hip, 10/10, constant and sharp.  Denied any dizziness, lightheadedness, loss of consciousness or hitting her head.  Currently not on any anticoagulation. No chest pain, shortness of breath or palpitations. No recent Covid exposures.  COVID-19 test pending  ED work-up/course: Temp 97.8, respiratory 21, BP 146/88, O2 sats 93% on room air CBC, BMET unremarkable Left hip x-ray showed nondisplaced, noncomminuted fracture of the left mid femoral neck with mild valgus angulation, no dislocation Left wrist x-ray showed comminuted, dorsally displaced and dorsally angulated fracture of the distal left radius with intra-articular component associated with mildly comminuted dorsally angulated fracture of the distal ulna  Orthopedics was consulted, Dr. Doreatha Martin, recommended OR in the morning  Review of Systems: Positives marked in 'bold' Constitutional: Denies fever, chills, diaphoresis, poor appetite and fatigue.  HEENT: Denies photophobia, eye pain, redness, hearing loss, ear pain, congestion, sore throat, rhinorrhea,  sneezing, mouth sores, trouble swallowing, neck pain, neck stiffness and tinnitus.   Respiratory: Denies SOB, DOE, cough, chest tightness,  and wheezing.   Cardiovascular: Denies chest pain, palpitations and leg swelling.  Gastrointestinal: Denies nausea, vomiting, abdominal pain, diarrhea, constipation, blood in stool and abdominal distention.  Genitourinary: Denies dysuria, urgency, frequency, hematuria, flank pain and difficulty urinating.  Musculoskeletal: Please see HPI Skin: Denies pallor, rash and wound.  Neurological: Denies dizziness, seizures, syncope, weakness, light-headedness, numbness and headaches.  Hematological: Denies adenopathy. Easy bruising, personal or family bleeding history  Psychiatric/Behavioral: Denies suicidal ideation, mood changes, confusion, nervousness, sleep disturbance and agitation  Past Medical History: Past Medical History:  Diagnosis Date  . Arthritis   . Asthma   . Depression   . GERD (gastroesophageal reflux disease)   . Hyperlipidemia   . Hyperparathyroidism   . Polycystic kidney   . PONV (postoperative nausea and vomiting)     Past Surgical History:  Procedure Laterality Date  . 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 BILATERAL OOPHORECTOMY    . CARPAL TUNNEL RELEASE  2000  . CESAREAN SECTION    . INSERTION OF DIALYSIS CATHETER  03/31/2012   Procedure: INSERTION OF DIALYSIS CATHETER;  Surgeon: Angelia Mould, MD;  Location: [facility];  Service: Vascular;  Laterality: N/A;  insertion of dialysis catheter right internal jugular vein  . 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 ORIF TIBIA FRACTURE Left 12/23/2018  Procedure: OPEN REDUCTION INTERNAL FIXATION (ORIF) TIBIA FRACTURE;  Surgeon: Shona Needles, MD;  Location: [facility];  Service: Orthopedics;  Laterality: Left;  . ORIF TIBIA PLATEAU Right 12/23/2018   Procedure: OPEN REDUCTION INTERNAL FIXATION (ORIF) TIBIAL PLATEAU;  Surgeon: Shona Needles, MD;  Location: [place];   Service: Orthopedics;  Laterality: Right;  . TONSILLECTOMY  1967  . TUBAL LIGATION  2010    Medications: Prior to Admission medications   Medication Sig Start Date End Date Taking? Authorizing Provider  acyclovir (ZOVIRAX) 400 MG tablet Take 400 mg by mouth 2 (two) times daily as needed (outbreaks).    Yes [provider]  albuterol (PROVENTIL HFA;VENTOLIN HFA) 108 (90 Base) MCG/ACT inhaler Inhale 2 puffs into the lungs every 4 (four) hours as needed for wheezing or shortness of breath. 07/16/16  Yes Ghimire, Henreitta Leber, MD  albuterol (PROVENTIL) (2.5 MG/3ML) 0.083% nebulizer solution Take 3 mLs (2.5 mg total) by nebulization 3 (three) times daily as needed for wheezing or shortness of breath. 07/02/19  Yes Kayleen Memos, DO  alendronate (FOSAMAX) 70 MG tablet Take 70 mg by mouth every 7 (seven) days. 05/11/19  Yes [provider]  allopurinol (ZYLOPRIM) 300 MG tablet Take 300 mg by mouth daily.   Yes [provider]  aspirin EC 81 MG tablet Take 81 mg by mouth daily.   Yes [provider]  brimonidine (ALPHAGAN P) 0.1 % SOLN Place 1 drop 2 (two) times daily into the left eye.   Yes [provider]  cholecalciferol (VITAMIN D3) 25 MCG (1000 UT) tablet Take 1,000 Units by mouth daily.    Yes [provider]  Colchicine 0.6 MG CAPS Take 0.6 mg by mouth daily as needed (gout).    Yes [provider]  cycloSPORINE (RESTASIS) 0.05 % ophthalmic emulsion Place 1 drop into both eyes 2 (two) times daily.   Yes [provider]  dorzolamide (TRUSOPT) 2 % ophthalmic solution Place 1 drop into both eyes daily. 06/25/19  Yes [provider]  FLUoxetine (PROZAC) 40 MG capsule Take 40 mg by mouth daily.   Yes [provider]  furosemide (LASIX) 40 MG tablet Take 1 tablet by mouth daily.  04/20/14  Yes [provider]  magnesium oxide (MAG-OX) 400 MG tablet Take 400 mg by mouth 2 (two) times daily.   Yes [provider]  mycophenolate (MYFORTIC) 180 MG EC tablet Take 180 mg by mouth 2 (two) times daily.   Yes [provider]  pantoprazole (PROTONIX) 40 MG tablet Take 40 mg by mouth daily.    Yes [provider]  predniSONE (DELTASONE) 5 MG tablet Take 7.5 mg by mouth daily.    Yes [provider]  PROGRAF 1 MG capsule Take 1 mg by mouth daily. 03/22/19  Yes [provider]  SYMBICORT 160-4.5 MCG/ACT inhaler Inhale 2 puffs into the lungs 2 (two) times daily. 10/13/19  Yes [provider]  tacrolimus (PROGRAF) 0.5 MG capsule Take 0.5 mg by mouth at bedtime. Takes brand name Prograf   Yes [provider]  TRAVATAN Z 0.004 % SOLN ophthalmic solution Place 1 drop into both eyes at bedtime. 07/03/16  Yes [provider]  vitamin B-12 (CYANOCOBALAMIN) 100 MCG tablet Take 100 mcg by mouth daily.   Yes [provider]    Allergies:   Allergies  Allergen Reactions  . Infed [Iron Dextran] Other (See Comments)    Chest tightness  . Pentamidine Itching, Shortness  Of Breath and Swelling  . Erythromycin [Erythromycin] Other (See Comments)    Mouth Ulcers  . Oxycodone Nausea Only and Nausea And Vomiting  . Erythromycin Rash    Causes breakout in mouth  . Ultram [Tramadol Hcl] Anxiety    Social History:  reports that she quit smoking about 21 years ago. Her smoking use included cigarettes. She has a 3.75 pack-year smoking history. She has never used smokeless tobacco. She reports that she does not drink alcohol or use drugs.  Family History: Family History  Problem Relation Age of Onset  . Hypertension Mother   . Heart disease Father        CABG history  . Polycystic kidney disease Father   . Polycystic kidney disease Brother     Physical Exam: Blood pressure (!) 146/85, pulse 83, temperature 97.8 F (36.6 C), temperature source Oral, resp. rate 19, height 5\' 1"  (1.549 m), weight 79.4 kg, SpO2 100 %. General: Alert, awake,  oriented x3, in no acute distress. Eyes: pink conjunctiva,anicteric sclera, pupils equal and reactive to light and accomodation, HEENT: normocephalic, atraumatic, oropharynx clear Neck: supple, no masses or lymphadenopathy, no goiter, no bruits, no JVD CVS: Regular rate and rhythm, without murmurs, rubs or gallops. No lower extremity edema Resp : Clear to auscultation bilaterally, no wheezing, rales or rhonchi. GI : Soft, nontender, nondistended, positive bowel sounds, no masses. No hepatomegaly. No hernia.  Musculoskeletal: Left arm in sling.  Pain in the left leg Neuro: Grossly intact, no focal neurological deficits, strength 5/5 upper and lower extremities right, LUE 5/5, L LE difficult to lift up the leg due to pain Psych: alert and oriented x 3, normal mood and affect Skin: no rashes or lesions, warm and dry   LABS on Admission: I have personally reviewed all the labs and imagings below    Basic Metabolic Panel: Recent Labs  Lab 12/07/19 1303  NA 140  K 4.3  CL 107  CO2 25  GLUCOSE 113*  BUN 39*  CREATININE 1.02*  CALCIUM 9.6   Liver Function Tests: No results for input(s): AST, ALT, ALKPHOS, BILITOT, PROT, ALBUMIN in the last 168 hours. No results for input(s): LIPASE, AMYLASE in the last 168 hours. No results for input(s): AMMONIA in the last 168 hours. CBC: Recent Labs  Lab 12/07/19 1303  WBC 8.7  NEUTROABS 7.6  HGB 10.8*  HCT 38.0  MCV 96.2  PLT 172   Cardiac Enzymes: No results for input(s): CKTOTAL, CKMB, CKMBINDEX, TROPONINI in the last 168 hours. BNP: Invalid input(s): POCBNP CBG: No results for input(s): GLUCAP in the last 168 hours.  Radiological Exams on Admission:  DG Wrist Complete Left  Result Date: 12/07/2019 CLINICAL DATA:  Fall.  Left wrist pain and swelling.  Deformity. EXAM: LEFT WRIST - COMPLETE 3+ VIEW COMPARISON:  None. FINDINGS: There is a comminuted, impacted and dorsally displaced fracture of the distal radius, with dorsal  displacement approximately 1.5 cm. Apparent intra-articular component of the fracture. The dorsal articular surface is also angulated dorsally approximately 20 degrees. There is an associated fracture of the distal ulna, mildly comminuted, also somewhat impacted and dorsally angulated. The carpus displaces with the distal radial fracture component. There is no dislocation. No other fractures. Skeletal structures are demineralized. There is surrounding soft tissue swelling. IMPRESSION: 1. Comminuted, dorsally displaced and dorsally angulated fracture of the distal left radius with an intra-articular component, associated with a mildly comminuted, dorsally angulated fracture of the distal ulna. No dislocation. Electronically Signed  By: Lajean Manes M.D.   On: 12/07/2019 13:51   DG Hip Unilat With Pelvis 2-3 Views Left  Result Date: 12/07/2019 CLINICAL DATA:  Fall.  Left hip pain. EXAM: DG HIP (WITH OR WITHOUT PELVIS) 2-3V LEFT COMPARISON:  None. FINDINGS: Nondisplaced, non comminuted fracture across the mid left femoral neck. Mild valgus angulation. No other fractures. Hip joints, SI joints and symphysis pubis are normally aligned. Skeletal structures are demineralized. Soft tissues are unremarkable. IMPRESSION: 1. Nondisplaced, non comminuted fracture of the left mid femoral neck with mild valgus angulation. No dislocation. Electronically Signed   By: Lajean Manes M.D.   On: 12/07/2019 13:52      EKG: Independently reviewed.  Rate 84, normal sinus rhythm   Assessment/Plan Principal Problem:   Closed left hip fracture (HCC) after mechanical fall -Status post mechanical fall, no cardiac symptoms, no near syncope or syncopal episode. -Orthopedics consulted, Dr Doreatha Martin, plan for the OR in a.m. -N.p.o. after midnight -Continue pain control.  DVT prophylaxis per orthopedics  Active Problems: Left wrist fracture -Orthopedics consulted, currently placed in sling -Follow orthopedics recommendations,  pain control  Polycystic kidney disease, end stage renal disease ([place]) status post renal transplant -Continue prednisone, Prograf, tacrolimus, mycophenolate -Holding Lasix today    Asthma, mild intermittent, recent exacerbation last week -Currently no wheezing -Continue inhalers, patient finishing prednisone taper, will give 1 dose of prednisone 20 mg -Continue maintenance dose of prednisone (renal transplant) starting tomorrow    GERD (gastroesophageal reflux disease) -Continue PPI    Chronic diastolic CHF (congestive heart failure) (HCC) -Currently stable, compensated, holding Lasix today lasix today    DVT prophylaxis: ted hoses, DVT prophylaxis per ortho surgery   CODE STATUS: full code   Consults called: ortho   Family Communication: Admission, patients condition and plan of care including tests being ordered have been discussed with the patient who indicates understanding and agree with the plan and Code Status  Admission status:   The medical decision making on this patient was of high complexity and the patient is at high risk for clinical deterioration, therefore this is a level 3 admission.  Severity of Illness:      The appropriate patient status for this patient is INPATIENT. Inpatient status is judged to be reasonable and necessary in order to provide the required intensity of service to ensure the patient's safety. The patient's presenting symptoms, physical exam findings, and initial radiographic and laboratory data in the context of their chronic comorbidities is felt to place them at high risk for further clinical deterioration. Furthermore, it is not anticipated that the patient will be medically stable for discharge from the hospital within 2 midnights of admission. The following factors support the patient status of inpatient.   " The patient's presenting symptoms include right hip and wrist pain after fall " The worrisome physical exam findings include right hip  and wrist fracture  " The initial radiographic and laboratory data are worrisome because of  right hip and wrist fracture  " The chronic co-morbidities include renal transplant, CHF   * I certify that at the point of admission it is my clinical judgment that the patient will require inpatient hospital care spanning beyond 2 midnights from the point of admission due to high intensity of service, high risk for further deterioration and high frequency of surveillance required.*    Time Spent on Admission: 60mins      Madelena Maturin M.D. [facility] 12/07/2019, 3:47 PM

## 2019-12-07 NOTE — ED Provider Notes (Addendum)
April [name]   CSN: [id number] Arrival date & time: 12/07/19  1215     History Chief Complaint  Patient presents with  . Fall  . Wrist Pain  . Hip Pain    April [name] is a 64 y.o. female.  The history is provided by the patient.  Fall This is a new problem. The current episode started less than 1 hour ago. The problem has been resolved. Pertinent negatives include no chest pain, no abdominal pain, no headaches and no shortness of breath. Associated symptoms comments: Fall while using walker today, landed on left side, pain to left wrist and hip. Patient with no LOC, no headache. Not on blood thinner. Kidney transplant hx. . Exacerbated by: movement. Relieved by: fentanyl. Treatments tried: fentanyl. The treatment provided moderate relief.  Wrist Pain Pertinent negatives include no chest pain, no abdominal pain, no headaches and no shortness of breath.  Hip Pain Pertinent negatives include no chest pain, no abdominal pain, no headaches and no shortness of breath.       Past Medical History:  Diagnosis Date  . Arthritis   . Asthma   . Depression   . GERD (gastroesophageal reflux disease)   . Hyperlipidemia   . Hyperparathyroidism   . Polycystic kidney   . PONV (postoperative nausea and vomiting)     Patient Active Problem List   Diagnosis Date Noted  . Closed left hip fracture ([place]) 12/07/2019  . Left wrist fracture 12/07/2019  . Medication management 09/22/2019  . Physical deconditioning 09/22/2019  . Hyponatremia 06/29/2019  . Acute respiratory failure with hypoxia ([place]) 04/19/2019  . Acute on chronic diastolic CHF (congestive heart failure) ([place]) 04/19/2019  . Bilateral closed proximal tibial fracture 12/21/2018  . Closed right ankle fracture 12/21/2018  . Fracture 12/21/2018  . Lower urinary tract infectious disease 10/03/2018  . Asthma, chronic, unspecified asthma severity, with acute exacerbation  10/03/2018  . SIRS (systemic inflammatory response syndrome) ([place]) 10/03/2018  . Nausea vomiting and diarrhea 10/02/2018  . Chronic diastolic CHF (congestive heart failure) ([place]) 10/01/2017  . Influenza B 10/01/2017  . Persistent asthma with status asthmaticus   . Pneumonia 07/15/2016  . Asthma, mild intermittent 07/15/2016  . GERD (gastroesophageal reflux disease) 07/15/2016  . Renal transplant recipient 07/15/2016  . Hyperlipidemia 07/15/2016  . Depression 07/15/2016  . Bronchitis, mucopurulent recurrent ([place]) 08/08/2014  . Chronic cough 07/24/2014  . End stage renal disease ([place]) 03/23/2012  . 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 BILATERAL OOPHORECTOMY    . CARPAL TUNNEL RELEASE  2000  . CESAREAN SECTION    . INSERTION OF DIALYSIS CATHETER  03/31/2012   Procedure: INSERTION OF DIALYSIS CATHETER;  Surgeon: Angelia Mould, MD;  Location: [facility];  Service: Vascular;  Laterality: N/A;  insertion of dialysis catheter right internal jugular vein  . 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 ORIF TIBIA FRACTURE Left 12/23/2018   Procedure: OPEN REDUCTION INTERNAL FIXATION (ORIF) TIBIA FRACTURE;  Surgeon: Shona Needles, MD;  Location: [facility];  Service: Orthopedics;  Laterality: Left;  . ORIF TIBIA PLATEAU Right 12/23/2018   Procedure: OPEN REDUCTION INTERNAL FIXATION (ORIF) TIBIAL PLATEAU;  Surgeon: Shona Needles, MD;  Location: [place] [facility];  Service: Orthopedics;  Laterality: Right;  . TONSILLECTOMY  1967  . TUBAL LIGATION  2010     OB History   No obstetric history on file.     Family History  Problem Relation Age of Onset  . Hypertension Mother   . Heart disease Father        CABG history  . Polycystic kidney disease Father   . Polycystic kidney disease Brother     Social History   Tobacco Use  . Smoking status:  Former Smoker    Packs/day: 0.25    Years: 15.00    Pack years: 3.75    Types: Cigarettes    Quit date: 03/22/1998    Years since quitting: 21.7  . Smokeless tobacco: Never Used  Substance Use Topics  . Alcohol use: No    Comment: social  . Drug use: No    Home Medications Prior to Admission medications   Medication Sig Start Date End Date Taking? Authorizing Provider  acyclovir (ZOVIRAX) 400 MG tablet Take 400 mg by mouth 2 (two) times daily as needed (outbreaks).    Yes [provider]  albuterol (PROVENTIL HFA;VENTOLIN HFA) 108 (90 Base) MCG/ACT inhaler Inhale 2 puffs into the lungs every 4 (four) hours as needed for wheezing or shortness of breath. 07/16/16  Yes Ghimire, Henreitta Leber, MD  albuterol (PROVENTIL) (2.5 MG/3ML) 0.083% nebulizer solution Take 3 mLs (2.5 mg total) by nebulization 3 (three) times daily as needed for wheezing or shortness of breath. 07/02/19  Yes Kayleen Memos, DO  alendronate (FOSAMAX) 70 MG tablet Take 70 mg by mouth every 7 (seven) days. 05/11/19  Yes [provider]  allopurinol (ZYLOPRIM) 300 MG tablet Take 300 mg by mouth daily.   Yes [provider]  aspirin EC 81 MG tablet Take 81 mg by mouth daily.   Yes [provider]  brimonidine (ALPHAGAN P) 0.1 % SOLN Place 1 drop 2 (two) times daily into the left eye.   Yes [provider]  cholecalciferol (VITAMIN D3) 25 MCG (1000 UT) tablet Take 1,000 Units by mouth daily.    Yes [provider]  Colchicine 0.6 MG CAPS Take 0.6 mg by mouth daily as needed (gout).    Yes [provider]  cycloSPORINE (RESTASIS) 0.05 % ophthalmic emulsion Place 1 drop into both eyes 2 (two) times daily.   Yes [provider]  dorzolamide (TRUSOPT) 2 % ophthalmic solution Place 1 drop into both eyes daily. 06/25/19  Yes [provider]  FLUoxetine (PROZAC) 40 MG capsule Take 40 mg by mouth daily.   Yes [provider]  furosemide (LASIX) 40 MG  tablet Take 1 tablet by mouth daily.  04/20/14  Yes [provider]  magnesium oxide (MAG-OX) 400 MG tablet Take 400 mg by mouth 2 (two) times daily.   Yes [provider]  mycophenolate (MYFORTIC) 180 MG EC tablet Take 180 mg by mouth 2 (two) times daily.   Yes [provider]  pantoprazole (PROTONIX) 40 MG tablet Take 40 mg by mouth daily.    Yes [provider]  predniSONE (DELTASONE) 5 MG tablet Take 7.5 mg by mouth daily.    Yes [provider]  PROGRAF 1 MG capsule Take 1 mg by mouth daily. 03/22/19  Yes [provider]  SYMBICORT 160-4.5 MCG/ACT inhaler Inhale 2 puffs into the lungs 2 (two) times daily. 10/13/19  Yes [provider]  tacrolimus (PROGRAF) 0.5 MG capsule Take 0.5 mg by mouth at bedtime. Takes brand name Prograf   Yes [provider]  Dyane Dustman  0.004 % SOLN ophthalmic solution Place 1 drop into both eyes at bedtime. 07/03/16  Yes [provider]  vitamin B-12 (CYANOCOBALAMIN) 100 MCG tablet Take 100 mcg by mouth daily.   Yes [provider]    Allergies    Infed [iron dextran], Pentamidine, Erythromycin [erythromycin], Oxycodone, Erythromycin, and Ultram [tramadol hcl]  Review of Systems   Review of Systems  Constitutional: Negative for chills and fever.  HENT: Negative for ear pain and sore throat.   Eyes: Negative for pain and visual disturbance.  Respiratory: Negative for cough and shortness of breath.   Cardiovascular: Negative for chest pain and palpitations.  Gastrointestinal: Negative for abdominal pain and vomiting.  Genitourinary: Negative for dysuria and hematuria.  Musculoskeletal: Positive for arthralgias and gait problem. Negative for back pain.  Skin: Negative for color change and rash.  Neurological: Negative for seizures, syncope and headaches.  All other systems reviewed and are negative.   Physical Exam Updated Vital Signs  ED Triage Vitals  Enc Vitals Group      BP 12/07/19 1227 (!) 146/88     Pulse Rate 12/07/19 1247 68     Resp --      Temp 12/07/19 1227 97.8 F (36.6 C)     Temp Source 12/07/19 1227 Oral     SpO2 12/07/19 1247 93 %     Weight --      Height --      Head Circumference --      Peak Flow --      Pain Score 12/07/19 1228 6     Pain Loc --      Pain Edu? --      Excl. in [place]? --     Physical Exam Vitals and nursing [name] reviewed.  Constitutional:      General: She is not in acute distress.    Appearance: She is well-developed. She is not ill-appearing.  HENT:     Head: Normocephalic and atraumatic.     Nose: Nose normal.     Mouth/Throat:     Mouth: Mucous membranes are moist.  Eyes:     Extraocular Movements: Extraocular movements intact.     Conjunctiva/sclera: Conjunctivae normal.     Pupils: Pupils are equal, round, and reactive to light.  Cardiovascular:     Rate and Rhythm: Normal rate and regular rhythm.     Pulses: Normal pulses.     Heart sounds: Normal heart sounds. No murmur.  Pulmonary:     Effort: Pulmonary effort is normal. No respiratory distress.     Breath sounds: Normal breath sounds.  Abdominal:     Palpations: Abdomen is soft.     Tenderness: There is no abdominal tenderness.  Musculoskeletal:        General: Tenderness (left wrist and hip) and deformity (left wrist) present.     Cervical back: Normal range of motion and neck supple.  Skin:    General: Skin is warm and dry.  Neurological:     General: No focal deficit present.     Mental Status: She is alert.     Sensory: No sensory deficit.     Motor: No weakness.     ED Results / Procedures / Treatments   Labs (all labs ordered are listed, but only abnormal results are displayed) Labs Reviewed  CBC WITH DIFFERENTIAL/PLATELET - Abnormal; Notable for the following components:      Result Value   Hemoglobin 10.8 (*)    MCHC 28.4 (*)  RDW 18.5 (*)    Lymphs Abs 0.5 (*)    Abs Immature Granulocytes 0.19 (*)    All other  components within normal limits  BASIC METABOLIC PANEL - Abnormal; Notable for the following components:   Glucose, Bld 113 (*)    BUN 39 (*)    Creatinine, Ser 1.02 (*)    GFR calc non Af Amer 59 (*)    All other components within normal limits  RESPIRATORY PANEL BY RT PCR (FLU A&B, COVID)    EKG EKG Interpretation  Date/Time:  Thursday December 07 2019 12:48:36 EST Ventricular Rate:  84 PR Interval:    QRS Duration: 93 QT Interval:  399 QTC Calculation: 472 R Axis:   88 Text Interpretation: Sinus rhythm Borderline right axis deviation Borderline low voltage, extremity leads Confirmed by Lennice Sites [phone]) on 12/07/2019 12:52:19 PM   Radiology DG Wrist Complete Left  Result Date: 12/07/2019 CLINICAL DATA:  Fall.  Left wrist pain and swelling.  Deformity. EXAM: LEFT WRIST - COMPLETE 3+ VIEW COMPARISON:  None. FINDINGS: There is a comminuted, impacted and dorsally displaced fracture of the distal radius, with dorsal displacement approximately 1.5 cm. Apparent intra-articular component of the fracture. The dorsal articular surface is also angulated dorsally approximately 20 degrees. There is an associated fracture of the distal ulna, mildly comminuted, also somewhat impacted and dorsally angulated. The carpus displaces with the distal radial fracture component. There is no dislocation. No other fractures. Skeletal structures are demineralized. There is surrounding soft tissue swelling. IMPRESSION: 1. Comminuted, dorsally displaced and dorsally angulated fracture of the distal left radius with an intra-articular component, associated with a mildly comminuted, dorsally angulated fracture of the distal ulna. No dislocation. Electronically Signed   By: Lajean Manes M.D.   On: 12/07/2019 13:51   DG Hip Unilat With Pelvis 2-3 Views Left  Result Date: 12/07/2019 CLINICAL DATA:  Fall.  Left hip pain. EXAM: DG HIP (WITH OR WITHOUT PELVIS) 2-3V LEFT COMPARISON:  None. FINDINGS: Nondisplaced, non  comminuted fracture across the mid left femoral neck. Mild valgus angulation. No other fractures. Hip joints, SI joints and symphysis pubis are normally aligned. Skeletal structures are demineralized. Soft tissues are unremarkable. IMPRESSION: 1. Nondisplaced, non comminuted fracture of the left mid femoral neck with mild valgus angulation. No dislocation. Electronically Signed   By: Lajean Manes M.D.   On: 12/07/2019 13:52    Procedures .Ortho Injury Treatment  Date/Time: 12/07/2019 3:14 PM Performed by: Lennice Sites, DO Authorized by: Lennice Sites, DO   Consent:    Consent obtained:  Verbal   Consent given by:  Patient   Risks discussed:  Fracture   Alternatives discussed:  Immobilization and delayed treatmentInjury location: wrist Location details: left wrist Injury type: fracture Fracture type: distal radius and ulnar styloid Pre-procedure neurovascular assessment: neurovascularly intact Pre-procedure distal perfusion: normal Pre-procedure neurological function: normal Pre-procedure range of motion: reduced Anesthesia: local infiltration  Anesthesia: Local anesthesia used: yes Local Anesthetic: lidocaine 1% without epinephrine Anesthetic total: 5 mL  Patient sedated: NoManipulation performed: yes Skin traction used: yes Skeletal traction used: yes Reduction successful: yes X-ray confirmed reduction: yes Immobilization: splint Splint type: sugar tong Post-procedure neurovascular assessment: post-procedure neurovascularly intact Post-procedure distal perfusion: normal Post-procedure neurological function: normal Post-procedure range of motion: unchanged Patient tolerance: patient tolerated the procedure well with no immediate complications    (including critical care time)  Medications Ordered in ED Medications  lidocaine (PF) (XYLOCAINE) 1 % injection 5 mL (has no administration in time range)  fentaNYL (SUBLIMAZE) injection 100 mcg (has no administration in  time range)  bacitracin 500 UNIT/GM ointment (has no administration in time range)  predniSONE (DELTASONE) tablet 20 mg (has no administration in time range)  fentaNYL (SUBLIMAZE) injection 50 mcg (50 mcg Intravenous Given 12/07/19 1301)    ED Course  I have reviewed the triage vital signs and the nursing notes.  Pertinent labs & imaging results that were available during my care of the patient were reviewed by me and considered in my medical decision making (see chart for details).    MDM Rules/Calculators/A&P  April [name] is a 64 year old female with history of polycystic kidney disease status post transplant who presents to the ED after mechanical fall.  Patient normal vitals.  No fever.  Patient fell while using her walker.  Landed on her left side.  Has left wrist deformity, left hip pain.  Suspect left wrist fracture and possible left hip fracture.  Did not hit her head or lose consciousness.  Patient is not on blood thinners.  Patient given IV fentanyl by EMS with improvement of pain.  Suspect patient will do well with a hematoma block for left wrist fracture reduction but did discuss propofol sedation if needed.  We will get x-rays of her hip and wrist.  We will get screening labs as anticipate admission for pain control and PT/OT given that she has poor baseline ambulation.  EKG shows sinus rhythm.  Patient with comminuted left distal radial fracture as well as ulnar fracture that is dorsally angulated.  Hematoma block was performed and patient was given IV fentanyl and reduction of fracture was performed.  X-ray postreduction shows improvement.  Patient was neurovascularly intact before and after reduction.  Patient also with left hip fracture.  Dr. Doreatha Martin was consulted as patient has had surgery with him in the past and he recommends admission at [facility] and plan for the OR tomorrow with either himself or one of his partners.  Medical clearance labs unremarkable.  EKG shows sinus  rhythm.  This chart was dictated using voice recognition software.  Despite best efforts to proofread,  errors can occur which can change the documentation meaning.    Final Clinical Impression(s) / ED Diagnoses Final diagnoses:  Pain  Closed fracture of left wrist, initial encounter  Closed fracture of left hip, initial encounter [facility])    Rx / Branch Orders ED Discharge Orders    None       Lennice Sites, DO 12/07/19 [place], [place], DO 12/07/19 1625

## 2019-12-07 NOTE — Consult Note (Signed)
Reason for Consult: Status post fall with femoral neck fracture left side and comminuted distal radius fracture left side Referring Physician: Hospitalist  April [name] is an 64 y.o. female.  HPI: The patient is a 64 year old female with history of renal transplant who has had multiple orthopedic problems in the past who fell earlier in the day and suffered a femoral neck fracture in the severely displaced distal radius fracture.  She had a distal radius fracture manipulated in the emergency room and has a much nicer reduction at this point.  We are consulted for evaluation and management of her injuries.  The patient denies numbness or tingling in her fingers since the reduction of her wrist.  She is having pain with all range of motion of her hip.  She is a household ambulator with a walker and continues to have some balance issues status post the problems that she had with her lower extremities that were treated by Dr. Doreatha Martin in the past.  Past Medical History:  Diagnosis Date  . Arthritis   . Asthma   . Depression   . GERD (gastroesophageal reflux disease)   . Hyperlipidemia   . Hyperparathyroidism   . Polycystic kidney   . PONV (postoperative nausea and vomiting)     Past Surgical History:  Procedure Laterality Date  . 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 BILATERAL OOPHORECTOMY    . CARPAL TUNNEL RELEASE  2000  . CESAREAN SECTION    . INSERTION OF DIALYSIS CATHETER  03/31/2012   Procedure: INSERTION OF DIALYSIS CATHETER;  Surgeon: Angelia Mould, MD;  Location: [facility];  Service: Vascular;  Laterality: N/A;  insertion of dialysis catheter right internal jugular vein  . 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 ORIF TIBIA FRACTURE Left 12/23/2018   Procedure: OPEN REDUCTION INTERNAL FIXATION (ORIF) TIBIA FRACTURE;  Surgeon: Shona Needles, MD;  Location: [facility];  Service: Orthopedics;  Laterality: Left;  . ORIF TIBIA PLATEAU Right 12/23/2018   Procedure:  OPEN REDUCTION INTERNAL FIXATION (ORIF) TIBIAL PLATEAU;  Surgeon: Shona Needles, MD;  Location: [facility];  Service: Orthopedics;  Laterality: Right;  . TONSILLECTOMY  1967  . TUBAL LIGATION  2010    Family History  Problem Relation Age of Onset  . Hypertension Mother   . Heart disease Father        CABG history  . Polycystic kidney disease Father   . Polycystic kidney disease Brother     Social History:  reports that she quit smoking about 21 years ago. Her smoking use included cigarettes. She has a 3.75 pack-year smoking history. She has never used smokeless tobacco. She reports that she does not drink alcohol or use drugs.  Allergies:  Allergies  Allergen Reactions  . Infed [Iron Dextran] Other (See Comments)    Chest tightness  . Pentamidine Itching, Shortness Of Breath and Swelling  . Erythromycin [Erythromycin] Other (See Comments)    Mouth Ulcers  . Oxycodone Nausea Only and Nausea And Vomiting  . Erythromycin Rash    Causes breakout in mouth  . Ultram [Tramadol Hcl] Anxiety    Medications: I have reviewed the patient's current medications.  Results for orders placed or performed during the hospital encounter of 12/07/19 (from the past 48 hour(s))  CBC with Differential     Status: Abnormal   Collection Time: 12/07/19  1:03 PM  Result Value Ref Range   WBC 8.7 4.0 - 10.5 K/uL  RBC 3.95 3.87 - 5.11 MIL/uL   Hemoglobin 10.8 (L) 12.0 - 15.0 g/dL   HCT 38.0 36.0 - 46.0 %   MCV 96.2 80.0 - 100.0 fL   MCH 27.3 26.0 - 34.0 pg   MCHC 28.4 (L) 30.0 - 36.0 g/dL   RDW 18.5 (H) 11.5 - 15.5 %   Platelets 172 150 - 400 K/uL   nRBC 0.0 0.0 - 0.2 %   Neutrophils Relative % 88 %   Neutro Abs 7.6 1.7 - 7.7 K/uL   Lymphocytes Relative 6 %   Lymphs Abs 0.5 (L) 0.7 - 4.0 K/uL   Monocytes Relative 4 %   Monocytes Absolute 0.3 0.1 - 1.0 K/uL   Eosinophils Relative 0 %   Eosinophils Absolute 0.0 0.0 - 0.5 K/uL   Basophils Relative 0 %   Basophils Absolute 0.0 0.0 - 0.1 K/uL    Immature Granulocytes 2 %   Abs Immature Granulocytes 0.19 (H) 0.00 - 0.07 K/uL    Comment: Performed at [facility], [place] [address], [place], [place] [address]  Basic metabolic panel     Status: Abnormal   Collection Time: 12/07/19  1:03 PM  Result Value Ref Range   Sodium 140 135 - 145 mmol/L   Potassium 4.3 3.5 - 5.1 mmol/L   Chloride 107 98 - 111 mmol/L   CO2 25 22 - 32 mmol/L   Glucose, Bld 113 (H) 70 - 99 mg/dL   BUN 39 (H) 8 - 23 mg/dL   Creatinine, Ser 1.02 (H) 0.44 - 1.00 mg/dL   Calcium 9.6 8.9 - 10.3 mg/dL   GFR calc non Af Amer 59 (L) >60 mL/min   GFR calc Af Amer >60 >60 mL/min   Anion gap 8 5 - 15    Comment: Performed at [facility], [place] [address], [place], [place] [address]    DG Wrist 2 Views Left  Result Date: 12/07/2019 CLINICAL DATA:  Post reduction EXAM: LEFT WRIST - 2 VIEW COMPARISON:  12/06/2018 FINDINGS: Interval casting material limits bone detail. Acute markedly comminuted intra-articular distal radius fracture with residual 1/3 shaft diameter of dorsal displacement of distal fracture fragment, though improved alignment compared to previous. Decreased angulation and impaction. Acute comminuted distal ulna fracture, also with decreased angulation and impaction. IMPRESSION: Acute comminuted intra-articular distal radius fracture with decreased impaction and displacement. Acute comminuted distal ulna fracture, also with decreased impaction and angulation Electronically Signed   By: Donavan Foil M.D.   On: 12/07/2019 16:44   DG Wrist Complete Left  Result Date: 12/07/2019 CLINICAL DATA:  Fall.  Left wrist pain and swelling.  Deformity. EXAM: LEFT WRIST - COMPLETE 3+ VIEW COMPARISON:  None. FINDINGS: There is a comminuted, impacted and dorsally displaced fracture of the distal radius, with dorsal displacement approximately 1.5 cm. Apparent intra-articular component of the fracture. The dorsal articular surface is also angulated  dorsally approximately 20 degrees. There is an associated fracture of the distal ulna, mildly comminuted, also somewhat impacted and dorsally angulated. The carpus displaces with the distal radial fracture component. There is no dislocation. No other fractures. Skeletal structures are demineralized. There is surrounding soft tissue swelling. IMPRESSION: 1. Comminuted, dorsally displaced and dorsally angulated fracture of the distal left radius with an intra-articular component, associated with a mildly comminuted, dorsally angulated fracture of the distal ulna. No dislocation. Electronically Signed   By: Lajean Manes M.D.   On: 12/07/2019 13:51   DG Hip Unilat With Pelvis 2-3 Views  Left  Result Date: 12/07/2019 CLINICAL DATA:  Fall.  Left hip pain. EXAM: DG HIP (WITH OR WITHOUT PELVIS) 2-3V LEFT COMPARISON:  None. FINDINGS: Nondisplaced, non comminuted fracture across the mid left femoral neck. Mild valgus angulation. No other fractures. Hip joints, SI joints and symphysis pubis are normally aligned. Skeletal structures are demineralized. Soft tissues are unremarkable. IMPRESSION: 1. Nondisplaced, non comminuted fracture of the left mid femoral neck with mild valgus angulation. No dislocation. Electronically Signed   By: Lajean Manes M.D.   On: 12/07/2019 13:52    ROS  ROS: I have reviewed the patient's review of systems thoroughly and there are no positive responses as relates to the HPI. Blood pressure (!) 143/75, pulse 72, temperature (!) 97.3 F (36.3 C), temperature source Oral, resp. rate 18, height 5\' 1"  (1.549 m), weight 79.4 kg, SpO2 98 %. Physical Exam Well-developed well-nourished patient in no acute distress. Alert and oriented x3 HEENT:within normal limits Cardiac: Regular rate and rhythm Pulmonary: Lungs clear to auscultation Abdomen: Soft and nontender.  Normal active bowel sounds  Musculoskeletal: (Left wrist is in a sugar tong splint with excellent range of motion of her  fingers.  Left hip has pain with all range of motion.  She is neurovascular intact distally.  She has well-healed wounds around the knee.) Assessment/Plan: 64 year old patient with displaced femoral neck fracture left side as well as severely comminuted distal radius fracture that has been manipulated and placed into a splint.  I had a long conversation with the patient today but I think that she would benefit from left total hip replacement.  Given the renal dialysis and long-term renal disease I think that percutaneous fixation is likely to not go on to heal.  I think given her young age hip replacement offers her the best option of having the best long-term result.  The distal radius will benefit from open reduction internal fixation with a volar plating.  I will plan on doing these 2 surgeries tomorrow at approximately 1:30.  Greatly appreciate the help of the hospitalist in terms of admitting and managing the patient in these complex times.  I had a long discussion with the patient about the risks and benefits of surgery.  She is well aware of the complex nature of her issues giving her increased risk of having infection long-term.  She certainly has the risk of need for repeat surgery and failure of surgery to help her.  She understands there is a slight risk of death and around the time of surgery but she wished to proceed in light of these issues.  We will plan on getting her fixed tomorrow.  Alta Corning 12/07/2019, 5:03 PM

## 2019-12-07 NOTE — ED Notes (Signed)
Pure wick has been placed. Suction set to 45mmHg.  

## 2019-12-07 NOTE — ED Notes (Signed)
After giving 50 mcg fentanyl patient desat to 88%. Put patient on 2L [place], 97% SpO2.

## 2019-12-07 NOTE — Progress Notes (Signed)
Orthopedic Tech Progress Note Patient Details:  April [name] [birth year] [id number]  Ortho Devices Type of Ortho Device: Cotton web roll, Wrist splint, Sugartong splint Ortho Device/Splint Location: LUE Ortho Device/Splint Interventions: Ordered, Application   Post Interventions Patient Tolerated: Well Instructions Provided: Care of device   Staci Righter 12/07/2019, 3:07 PM

## 2019-12-07 NOTE — ED Triage Notes (Signed)
Home via EMS Mechanical fall, no LOC, no blood thinners Pt fell to floor, L hip pain, L wrist obvious deformity and pain  Pt positioned on R side 6/10 pain when moving   150 mcg fentanyl  22 R FA AOx4  140/92 88 HR  Hx: kidney transplant, dialysis site in L arm

## 2019-12-08 ENCOUNTER — Encounter (HOSPITAL_COMMUNITY): Payer: Self-pay | Admitting: Internal Medicine

## 2019-12-08 ENCOUNTER — Encounter (HOSPITAL_COMMUNITY)
Admission: EM | Disposition: A | Discharge status: Rehabilitation Facility | Payer: Self-pay | Source: Home / Self Care | Attending: Internal Medicine

## 2019-12-08 ENCOUNTER — Inpatient Hospital Stay (HOSPITAL_COMMUNITY): Payer: Medicare Other

## 2019-12-08 ENCOUNTER — Inpatient Hospital Stay (HOSPITAL_COMMUNITY): Payer: Medicare Other | Admitting: Certified Registered"

## 2019-12-08 HISTORY — PX: ORIF WRIST FRACTURE: SHX2133

## 2019-12-08 HISTORY — PX: TOTAL HIP ARTHROPLASTY: SHX124

## 2019-12-08 LAB — POCT I-STAT EG7
Acid-base deficit: 1 mmol/L (ref 0.0–2.0)
Bicarbonate: 26.5 mmol/L (ref 20.0–28.0)
Calcium, Ion: 1.31 mmol/L (ref 1.15–1.40)
HCT: 35 % — ABNORMAL LOW (ref 36.0–46.0)
Hemoglobin: 11.9 g/dL — ABNORMAL LOW (ref 12.0–15.0)
O2 Saturation: 79 %
Potassium: 4.8 mmol/L (ref 3.5–5.1)
Sodium: 137 mmol/L (ref 135–145)
TCO2: 28 mmol/L (ref 22–32)
pCO2, Ven: 54.8 mmHg (ref 44.0–60.0)
pH, Ven: 7.292 (ref 7.250–7.430)
pO2, Ven: 50 mmHg — ABNORMAL HIGH (ref 32.0–45.0)

## 2019-12-08 LAB — PREPARE RBC (CROSSMATCH)

## 2019-12-08 LAB — POCT I-STAT, CHEM 8
BUN: 28 mg/dL — ABNORMAL HIGH (ref 8–23)
Calcium, Ion: 1.19 mmol/L (ref 1.15–1.40)
Chloride: 104 mmol/L (ref 98–111)
Creatinine, Ser: 1.2 mg/dL — ABNORMAL HIGH (ref 0.44–1.00)
Glucose, Bld: 225 mg/dL — ABNORMAL HIGH (ref 70–99)
HCT: 31 % — ABNORMAL LOW (ref 36.0–46.0)
Hemoglobin: 10.5 g/dL — ABNORMAL LOW (ref 12.0–15.0)
Potassium: 4.9 mmol/L (ref 3.5–5.1)
Sodium: 138 mmol/L (ref 135–145)
TCO2: 22 mmol/L (ref 22–32)

## 2019-12-08 LAB — HIV ANTIBODY (ROUTINE TESTING W REFLEX): HIV Screen 4th Generation wRfx: NONREACTIVE

## 2019-12-08 SURGERY — ARTHROPLASTY, HIP, TOTAL, ANTERIOR APPROACH
Anesthesia: General | Site: Wrist | Laterality: Left

## 2019-12-08 MED ORDER — LIDOCAINE 2% (20 MG/ML) 5 ML SYRINGE
INTRAMUSCULAR | Status: DC | PRN
  Administered 2019-12-08: 60 mg via INTRAVENOUS

## 2019-12-08 MED ORDER — FENTANYL CITRATE (PF) 100 MCG/2ML IJ SOLN: 50.0000 ug | Freq: Once | INTRAMUSCULAR | Status: DC

## 2019-12-08 MED ORDER — MIDAZOLAM HCL 2 MG/2ML IJ SOLN: 1.0000 mg | Freq: Once | INTRAMUSCULAR | Status: AC

## 2019-12-08 MED ORDER — PHENYLEPHRINE 40 MCG/ML (10ML) SYRINGE FOR IV PUSH (FOR BLOOD PRESSURE SUPPORT)
PREFILLED_SYRINGE | INTRAVENOUS | Status: AC
  Filled 2019-12-08: qty 10

## 2019-12-08 MED ORDER — CEFAZOLIN SODIUM-DEXTROSE 2-4 GM/100ML-% IV SOLN
2.0000 g | Freq: Four times a day (QID) | INTRAVENOUS | Status: AC
  Administered 2019-12-08 – 2019-12-09 (×2): 2 g via INTRAVENOUS
  Filled 2019-12-08 (×2): qty 100

## 2019-12-08 MED ORDER — HYDROMORPHONE HCL 1 MG/ML IJ SOLN: 0.2500 mg | INTRAMUSCULAR | Status: DC | PRN

## 2019-12-08 MED ORDER — ALBUMIN HUMAN 5 % IV SOLN: INTRAVENOUS | Status: DC | PRN

## 2019-12-08 MED ORDER — ACETAMINOPHEN 500 MG PO TABS: 500.0000 mg | ORAL_TABLET | Freq: Four times a day (QID) | ORAL | Status: AC

## 2019-12-08 MED ORDER — PHENYLEPHRINE HCL-NACL 10-0.9 MG/250ML-% IV SOLN
INTRAVENOUS | Status: DC | PRN
  Administered 2019-12-08: 50 ug/min via INTRAVENOUS

## 2019-12-08 MED ORDER — ACETAMINOPHEN 325 MG PO TABS
325.0000 mg | ORAL_TABLET | Freq: Four times a day (QID) | ORAL | Status: DC | PRN
  Administered 2019-12-13: 650 mg via ORAL
  Filled 2019-12-08: qty 2

## 2019-12-08 MED ORDER — LACTATED RINGERS IV SOLN: INTRAVENOUS | Status: DC

## 2019-12-08 MED ORDER — ONDANSETRON HCL 4 MG/2ML IJ SOLN
INTRAMUSCULAR | Status: AC
  Filled 2019-12-08: qty 2

## 2019-12-08 MED ORDER — LIDOCAINE 2% (20 MG/ML) 5 ML SYRINGE
INTRAMUSCULAR | Status: AC
  Filled 2019-12-08: qty 5

## 2019-12-08 MED ORDER — FENTANYL CITRATE (PF) 100 MCG/2ML IJ SOLN
INTRAMUSCULAR | Status: AC
  Filled 2019-12-08: qty 2

## 2019-12-08 MED ORDER — DEXAMETHASONE SODIUM PHOSPHATE 10 MG/ML IJ SOLN
INTRAMUSCULAR | Status: DC | PRN
  Administered 2019-12-08: 8 mg via INTRAVENOUS

## 2019-12-08 MED ORDER — FUROSEMIDE 40 MG PO TABS
40.0000 mg | ORAL_TABLET | Freq: Every day | ORAL | Status: DC
  Administered 2019-12-08: 40 mg via ORAL
  Filled 2019-12-08 (×2): qty 1

## 2019-12-08 MED ORDER — CALCIUM CHLORIDE 10 % IV SOLN
INTRAVENOUS | Status: DC | PRN
  Administered 2019-12-08: .25 g via INTRAVENOUS

## 2019-12-08 MED ORDER — PROMETHAZINE HCL 25 MG/ML IJ SOLN: 6.2500 mg | INTRAMUSCULAR | Status: DC | PRN

## 2019-12-08 MED ORDER — LACTATED RINGERS IV SOLN: INTRAVENOUS | Status: DC | PRN

## 2019-12-08 MED ORDER — PHENYLEPHRINE HCL (PRESSORS) 10 MG/ML IV SOLN
INTRAVENOUS | Status: AC
  Filled 2019-12-08: qty 1

## 2019-12-08 MED ORDER — ASPIRIN EC 325 MG PO TBEC
325.0000 mg | DELAYED_RELEASE_TABLET | Freq: Every day | ORAL | Status: DC
  Administered 2019-12-09 – 2019-12-15 (×7): 325 mg via ORAL
  Filled 2019-12-08 (×7): qty 1

## 2019-12-08 MED ORDER — ROPIVACAINE HCL 5 MG/ML IJ SOLN: INTRAMUSCULAR | Status: DC | PRN

## 2019-12-08 MED ORDER — SODIUM CHLORIDE 0.9 % IV SOLN: INTRAVENOUS | Status: DC | PRN

## 2019-12-08 MED ORDER — ONDANSETRON HCL 4 MG PO TABS: 4.0000 mg | ORAL_TABLET | Freq: Four times a day (QID) | ORAL | Status: DC | PRN

## 2019-12-08 MED ORDER — DEXAMETHASONE SODIUM PHOSPHATE 10 MG/ML IJ SOLN
INTRAMUSCULAR | Status: AC
  Filled 2019-12-08: qty 1

## 2019-12-08 MED ORDER — MORPHINE SULFATE (PF) 2 MG/ML IV SOLN
0.5000 mg | INTRAVENOUS | Status: DC | PRN
  Administered 2019-12-08: 1 mg via INTRAVENOUS
  Filled 2019-12-08: qty 1

## 2019-12-08 MED ORDER — SCOPOLAMINE 1 MG/3DAYS TD PT72
MEDICATED_PATCH | TRANSDERMAL | Status: DC | PRN
  Administered 2019-12-08: 1 via TRANSDERMAL

## 2019-12-08 MED ORDER — MAGNESIUM OXIDE 400 (241.3 MG) MG PO TABS
400.0000 mg | ORAL_TABLET | Freq: Two times a day (BID) | ORAL | Status: DC
  Administered 2019-12-08 – 2019-12-11 (×7): 400 mg via ORAL
  Filled 2019-12-08 (×7): qty 1

## 2019-12-08 MED ORDER — LIDOCAINE-EPINEPHRINE 2 %-1:100000 IJ SOLN
INTRAMUSCULAR | Status: DC | PRN
  Administered 2019-12-08: 10 mL via PERINEURAL

## 2019-12-08 MED ORDER — ALBUMIN HUMAN 5 % IV SOLN
INTRAVENOUS | Status: AC
  Filled 2019-12-08: qty 250

## 2019-12-08 MED ORDER — CHLORHEXIDINE GLUCONATE CLOTH 2 % EX PADS
6.0000 | MEDICATED_PAD | Freq: Every day | CUTANEOUS | Status: DC
  Administered 2019-12-12 – 2019-12-15 (×2): 6 via TOPICAL

## 2019-12-08 MED ORDER — TRANEXAMIC ACID 1000 MG/10ML IV SOLN
2000.0000 mg | Freq: Once | INTRAVENOUS | Status: AC
  Administered 2019-12-08: 2000 mg via TOPICAL
  Filled 2019-12-08: qty 20

## 2019-12-08 MED ORDER — DOCUSATE SODIUM 100 MG PO CAPS
100.0000 mg | ORAL_CAPSULE | Freq: Two times a day (BID) | ORAL | Status: DC
  Administered 2019-12-08 – 2019-12-15 (×14): 100 mg via ORAL
  Filled 2019-12-08 (×14): qty 1

## 2019-12-08 MED ORDER — PROPOFOL 10 MG/ML IV BOLUS
INTRAVENOUS | Status: AC
  Filled 2019-12-08: qty 20

## 2019-12-08 MED ORDER — DEXTROSE-NACL 5-0.45 % IV SOLN: INTRAVENOUS | Status: DC

## 2019-12-08 MED ORDER — FENTANYL CITRATE (PF) 250 MCG/5ML IJ SOLN
INTRAMUSCULAR | Status: AC
  Filled 2019-12-08: qty 5

## 2019-12-08 MED ORDER — SCOPOLAMINE 1 MG/3DAYS TD PT72
MEDICATED_PATCH | TRANSDERMAL | Status: AC
  Filled 2019-12-08: qty 1

## 2019-12-08 MED ORDER — FENTANYL CITRATE (PF) 100 MCG/2ML IJ SOLN
INTRAMUSCULAR | Status: DC | PRN
  Administered 2019-12-08 (×2): 50 ug via INTRAVENOUS
  Administered 2019-12-08 (×2): 25 ug via INTRAVENOUS
  Administered 2019-12-08: 50 ug via INTRAVENOUS
  Administered 2019-12-08 (×2): 25 ug via INTRAVENOUS

## 2019-12-08 MED ORDER — ALUM & MAG HYDROXIDE-SIMETH 200-200-20 MG/5ML PO SUSP: 30.0000 mL | ORAL | Status: DC | PRN

## 2019-12-08 MED ORDER — BUPIVACAINE LIPOSOME 1.3 % IJ SUSP
10.0000 mL | Freq: Once | INTRAMUSCULAR | Status: AC
  Administered 2019-12-08: 10 mL
  Filled 2019-12-08: qty 10

## 2019-12-08 MED ORDER — WATER FOR IRRIGATION, STERILE IR SOLN
Status: DC | PRN
  Administered 2019-12-08: 2000 mL

## 2019-12-08 MED ORDER — SUCCINYLCHOLINE CHLORIDE 200 MG/10ML IV SOSY
PREFILLED_SYRINGE | INTRAVENOUS | Status: AC
  Filled 2019-12-08: qty 10

## 2019-12-08 MED ORDER — PHENYLEPHRINE 40 MCG/ML (10ML) SYRINGE FOR IV PUSH (FOR BLOOD PRESSURE SUPPORT)
PREFILLED_SYRINGE | INTRAVENOUS | Status: DC | PRN
  Administered 2019-12-08: 160 ug via INTRAVENOUS
  Administered 2019-12-08: 40 ug via INTRAVENOUS
  Administered 2019-12-08 (×6): 120 ug via INTRAVENOUS

## 2019-12-08 MED ORDER — ARTIFICIAL TEARS OPHTHALMIC OINT
TOPICAL_OINTMENT | OPHTHALMIC | Status: AC
  Filled 2019-12-08: qty 3.5

## 2019-12-08 MED ORDER — FENTANYL CITRATE (PF) 100 MCG/2ML IJ SOLN
INTRAMUSCULAR | Status: AC
  Administered 2019-12-08: 50 ug via INTRAVENOUS
  Filled 2019-12-08: qty 2

## 2019-12-08 MED ORDER — MIDAZOLAM HCL 2 MG/2ML IJ SOLN
INTRAMUSCULAR | Status: AC
  Administered 2019-12-08: 2 mg via INTRAVENOUS
  Filled 2019-12-08: qty 2

## 2019-12-08 MED ORDER — BUPIVACAINE HCL (PF) 0.25 % IJ SOLN
INTRAMUSCULAR | Status: DC | PRN
  Administered 2019-12-08: 30 mL

## 2019-12-08 MED ORDER — BUPIVACAINE HCL (PF) 0.25 % IJ SOLN
INTRAMUSCULAR | Status: AC
  Filled 2019-12-08: qty 30

## 2019-12-08 MED ORDER — ASPIRIN EC 325 MG PO TBEC: 325.0000 mg | DELAYED_RELEASE_TABLET | Freq: Every day | ORAL | 0 refills | Status: DC

## 2019-12-08 MED ORDER — ONDANSETRON HCL 4 MG/2ML IJ SOLN: 4.0000 mg | Freq: Four times a day (QID) | INTRAMUSCULAR | Status: DC | PRN

## 2019-12-08 MED ORDER — ROPIVACAINE HCL 5 MG/ML IJ SOLN
INTRAMUSCULAR | Status: DC | PRN
  Administered 2019-12-08: 20 mL via PERINEURAL

## 2019-12-08 MED ORDER — CLONIDINE HCL (ANALGESIA) 100 MCG/ML EP SOLN
EPIDURAL | Status: DC | PRN
  Administered 2019-12-08: 100 ug

## 2019-12-08 MED ORDER — SUCCINYLCHOLINE CHLORIDE 200 MG/10ML IV SOSY
PREFILLED_SYRINGE | INTRAVENOUS | Status: DC | PRN
  Administered 2019-12-08: 120 mg via INTRAVENOUS

## 2019-12-08 MED ORDER — SODIUM CHLORIDE 0.9 % IR SOLN
Status: DC | PRN
  Administered 2019-12-08: 1000 mL

## 2019-12-08 MED ORDER — PROPOFOL 10 MG/ML IV BOLUS
INTRAVENOUS | Status: DC | PRN
  Administered 2019-12-08: 130 mg via INTRAVENOUS

## 2019-12-08 SURGICAL SUPPLY — 73 items
BAG ZIPLOCK 12X15 (MISCELLANEOUS) IMPLANT
BENZOIN TINCTURE PRP APPL 2/3 (GAUZE/BANDAGES/DRESSINGS) IMPLANT
BIT DRILL 2 FAST STEP (BIT) ×2 IMPLANT
BIT DRILL 2.5X4 QC (BIT) ×2 IMPLANT
BLADE SAW SGTL 18X1.27X75 (BLADE) ×3 IMPLANT
BLADE SAW SGTL 18X1.27X75MM (BLADE) ×1
BLADE SURG SZ10 CARB STEEL (BLADE) ×8 IMPLANT
BNDG ELASTIC 3X5.8 VLCR STR LF (GAUZE/BANDAGES/DRESSINGS) ×2 IMPLANT
BNDG ELASTIC 4X5.8 VLCR STR LF (GAUZE/BANDAGES/DRESSINGS) ×2 IMPLANT
CABLE READY CERCLAGE W/CRIP (Trauma Fixation) ×2 IMPLANT
CLOSURE WOUND 1/2 X4 (GAUZE/BANDAGES/DRESSINGS)
COVER PERINEAL POST (MISCELLANEOUS) ×4 IMPLANT
COVER SURGICAL LIGHT HANDLE (MISCELLANEOUS) ×4 IMPLANT
COVER WAND RF STERILE (DRAPES) IMPLANT
CUP GRIPTON 48MM 100 HIP (Hips) ×2 IMPLANT
DECANTER SPIKE VIAL GLASS SM (MISCELLANEOUS) ×4 IMPLANT
DRAPE EXTREMITY T 121X128X90 (DISPOSABLE) ×2 IMPLANT
DRAPE STERI IOBAN 125X83 (DRAPES) ×4 IMPLANT
DRAPE U-SHAPE 47X51 STRL (DRAPES) ×8 IMPLANT
DRSG AQUACEL AG ADV 3.5X 6 (GAUZE/BANDAGES/DRESSINGS) ×4 IMPLANT
DURAPREP 26ML APPLICATOR (WOUND CARE) ×4 IMPLANT
ELECT BLADE TIP CTD 4 INCH (ELECTRODE) ×4 IMPLANT
ELECT REM PT RETURN 15FT ADLT (MISCELLANEOUS) ×4 IMPLANT
ELIMINATOR HOLE APEX DEPUY (Hips) ×2 IMPLANT
GAUZE SPONGE 4X4 12PLY STRL (GAUZE/BANDAGES/DRESSINGS) ×2 IMPLANT
GAUZE XEROFORM 1X8 LF (GAUZE/BANDAGES/DRESSINGS) IMPLANT
GAUZE XEROFORM 5X9 LF (GAUZE/BANDAGES/DRESSINGS) ×2 IMPLANT
GLOVE BIO SURGEON STRL SZ8 (GLOVE) ×4 IMPLANT
GLOVE BIOGEL PI IND STRL 8 (GLOVE) ×4 IMPLANT
GLOVE BIOGEL PI INDICATOR 8 (GLOVE) ×4
GLOVE ECLIPSE 7.5 STRL STRAW (GLOVE) ×8 IMPLANT
GOWN STRL REUS W/TWL XL LVL3 (GOWN DISPOSABLE) ×8 IMPLANT
HEAD FEM STD 32X+5 STRL (Hips) ×2 IMPLANT
HOLDER FOLEY CATH W/STRAP (MISCELLANEOUS) ×4 IMPLANT
HOOD PEEL AWAY FLYTE STAYCOOL (MISCELLANEOUS) ×8 IMPLANT
K-WIRE 1.6 (WIRE) ×4
K-WIRE FX5X1.6XNS BN SS (WIRE) ×4
KIT TURNOVER KIT A (KITS) IMPLANT
KWIRE FX5X1.6XNS BN SS (WIRE) IMPLANT
LINER ACET 32X48 (Liner) ×2 IMPLANT
MANIFOLD NEPTUNE II (INSTRUMENTS) ×4 IMPLANT
NEEDLE HYPO 22GX1.5 SAFETY (NEEDLE) ×4 IMPLANT
PACK ANTERIOR HIP CUSTOM (KITS) ×4 IMPLANT
PAD CAST 4YDX4 CTTN HI CHSV (CAST SUPPLIES) IMPLANT
PADDING CAST COTTON 4X4 STRL (CAST SUPPLIES) ×2
PEG SUBCHONDRAL SMOOTH 2.0X14 (Peg) ×4 IMPLANT
PEG SUBCHONDRAL SMOOTH 2.0X18 (Peg) ×6 IMPLANT
PEG SUBCHONDRAL SMOOTH 2.0X20 (Peg) ×2 IMPLANT
PEG SUBCHONDRAL SMOOTH 2.0X22 (Peg) ×2 IMPLANT
PENCIL SMOKE EVACUATOR (MISCELLANEOUS) IMPLANT
PLATE SHORT 57 NRRW LT (Plate) ×2 IMPLANT
PROTECTOR NERVE ULNAR (MISCELLANEOUS) ×4 IMPLANT
SCREW BN 12X3.5XNS CORT TI (Screw) IMPLANT
SCREW BN 13X3.5XNS CORT TI (Screw) IMPLANT
SCREW CORT 3.5X12 (Screw) ×4 IMPLANT
SCREW CORT 3.5X13 (Screw) ×2 IMPLANT
SCREW CORT 3.5X14 LNG (Screw) ×2 IMPLANT
SPLINT FIBERGLASS 3X35 (CAST SUPPLIES) ×2 IMPLANT
STAPLER VISISTAT 35W (STAPLE) ×2 IMPLANT
STEM CORAIL KA16 (Stem) ×2 IMPLANT
STRIP CLOSURE SKIN 1/2X4 (GAUZE/BANDAGES/DRESSINGS) IMPLANT
SUT ETHIBOND NAB CT1 #1 30IN (SUTURE) ×8 IMPLANT
SUT ETHILON 3 0 FSL (SUTURE) ×2 IMPLANT
SUT MNCRL AB 3-0 PS2 18 (SUTURE) IMPLANT
SUT VIC AB 0 CT1 27 (SUTURE) ×4
SUT VIC AB 0 CT1 27XBRD ANTBC (SUTURE) IMPLANT
SUT VIC AB 0 CT1 36 (SUTURE) ×6 IMPLANT
SUT VIC AB 1 CT1 36 (SUTURE) ×4 IMPLANT
SUT VIC AB 2-0 CT1 27 (SUTURE) ×2
SUT VIC AB 2-0 CT1 TAPERPNT 27 (SUTURE) IMPLANT
SUT VIC AB 4-0 PS2 27 (SUTURE) ×2 IMPLANT
TRAY FOLEY MTR SLVR 16FR STAT (SET/KITS/TRAYS/PACK) ×4 IMPLANT
YANKAUER SUCT BULB TIP 10FT TU (MISCELLANEOUS) ×4 IMPLANT

## 2019-12-08 NOTE — Progress Notes (Signed)
Orthopedic Tech Progress Note Patient Details:  April [name] May 08, 1956 [id number]  Ortho Devices Type of Ortho Device: Bone foam zero knee Ortho Device/Splint Location: LUE Ortho Device/Splint Interventions: Ordered   Post Interventions Patient Tolerated: Well Instructions Provided: Care of device   Staci Righter 12/08/2019, 7:05 PM

## 2019-12-08 NOTE — Anesthesia Preprocedure Evaluation (Signed)
Anesthesia Evaluation  Patient identified by MRN, date of birth, ID band Patient awake    Reviewed: Allergy & Precautions, NPO status , Patient's Chart, lab work & pertinent test results  History of Anesthesia Complications (+) PONV  Airway Mallampati: II  TM Distance: >3 FB Neck ROM: Full    Dental no notable dental hx.    Pulmonary asthma , former smoker,    Pulmonary exam normal breath sounds clear to auscultation       Cardiovascular negative cardio ROS Normal cardiovascular exam Rhythm:Regular Rate:Normal     Neuro/Psych negative neurological ROS  negative psych ROS   GI/Hepatic Neg liver ROS, GERD  ,  Endo/Other  negative endocrine ROS  Renal/GU Renal disease  negative genitourinary   Musculoskeletal negative musculoskeletal ROS (+)   Abdominal   Peds negative pediatric ROS (+)  Hematology negative hematology ROS (+)   Anesthesia Other Findings   Reproductive/Obstetrics negative OB ROS                             Anesthesia Physical Anesthesia Plan  ASA: III  Anesthesia Plan: General   Post-op Pain Management:    Induction: Intravenous  PONV Risk Score and Plan: 4 or greater and Ondansetron, Dexamethasone, Scopolamine patch - Pre-op and Treatment may vary due to age or medical condition  Airway Management Planned: Oral ETT  Additional Equipment:   Intra-op Plan:   Post-operative Plan: Extubation in OR  Informed Consent: I have reviewed the patients History and Physical, chart, labs and discussed the procedure including the risks, benefits and alternatives for the proposed anesthesia with the patient or authorized representative who has indicated his/her understanding and acceptance.     Dental advisory given  Plan Discussed with: CRNA and Surgeon  Anesthesia Plan Comments:         Anesthesia Quick Evaluation

## 2019-12-08 NOTE — Progress Notes (Signed)
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  X-ray imaging showed nondisplaced, noncomminuted fracture of the left mid femoral neck with mid to valgus angulation, comminuted fracture of the distal left radius.  Orthopedics consulted.  Planning for ORIF today.  Assessment & Plan:   Principal Problem:   Closed left hip fracture (HCC) Active Problems:   End stage renal disease (HCC)   Asthma, mild intermittent   GERD (gastroesophageal reflux disease)   Renal transplant recipient   Hyperlipidemia   Chronic diastolic CHF (congestive heart failure) (HCC)   Fracture   Left wrist fracture   Closed left hip fracture after mechanical fall: Orthopedics following.  X-ray finding as above.  Undergoing ORIF today.  DVT prophylaxis after surgery as per orthopedics PT/OT will be consulted after surgery.  Might need rehab on discharge  Left wrist fracture: Currently in sling.  Plan as per orthopedics  Polycystic kidney disease, end-stage renal disease status post renal transplant: On prednisone, Prograf, tacrolimus, mycophenolate, Lasix at home.  Follows with Dr. Jimmy Footman  Mild intermittent asthma: Currently stable.  Not wheezing.  On inhalers at home.  GERD: Continue PPI  Chronic diastolic CHF: Currently euvolemic.  On Lasix at home.         DVT prophylaxis:SCD Code Status: Full code Family Communication: None present at the bedside Disposition Plan: Likely skilled nursing facility.  Pending PT/OT evaluation   Consultants: Orthopedics  Procedures: None  Antimicrobials:  Anti-infectives (From admission, onward)   Start     Dose/Rate Route Frequency Ordered Stop   12/08/19 0600  ceFAZolin  (ANCEF) IVPB 2g/100 mL premix     2 g 200 mL/hr over 30 Minutes Intravenous On call to O.R. 12/07/19 2132 12/08/19 1412      Subjective:  Patient seen and examined at the bedside this morning.  Hemodynamically stable.  Not in distress.  Complains of pain on the fracture site on the wrist.  He pain well controlled.  Objective: Vitals:   12/08/19 1327 12/08/19 1328 12/08/19 1329 12/08/19 1330  BP:  140/84    Pulse: 87 88 88 89  Resp: 13 12 13 13   Temp:      TempSrc:      SpO2: 100% 100% 100% 100%  Weight:      Height:        Intake/Output Summary (Last 24 hours) at 12/08/2019 1418 Last data filed at 12/08/2019 1357 Gross per 24 hour  Intake 920 ml  Output 1350 ml  Net -430 ml   Filed Weights   12/07/19 1304  Weight: 79.4 kg    Examination:  General exam: Not in distress, obese HEENT:PERRL, Ear/Nose normal on gross exam Respiratory system: Bilateral equal air entry, normal vesicular breath sounds, no wheezes or crackles  Cardiovascular system: S1 & S2 heard, RRR. No JVD, murmurs, rubs, gallops or clicks. No pedal edema. Gastrointestinal system: Abdomen is nondistended, soft and nontender. No organomegaly or masses felt. Normal bowel sounds heard. Central nervous system: Alert and oriented. No focal neurological deficits. Extremities: No edema, no clubbing ,no cyanosis, sling on the left arm, decreased range of motion on the left lower extremity, tenderness on the left hip  skin: No rashes, lesions or ulcers,no icterus ,no pallor  Data Reviewed: I have personally reviewed following labs and imaging studies  CBC: Recent Labs  Lab 12/07/19 1303  WBC 8.7  NEUTROABS 7.6  HGB 10.8*  HCT 38.0  MCV 96.2  PLT 301   Basic Metabolic Panel: Recent Labs  Lab 12/07/19 1303  NA 140  K 4.3  CL 107  CO2 25  GLUCOSE 113*  BUN 39*  CREATININE 1.02*  CALCIUM 9.6   GFR: Estimated Creatinine Clearance: 54.5 mL/min (A) (by C-G formula based on SCr of 1.02 mg/dL  (H)). Liver Function Tests: No results for input(s): AST, ALT, ALKPHOS, BILITOT, PROT, ALBUMIN in the last 168 hours. No results for input(s): LIPASE, AMYLASE in the last 168 hours. No results for input(s): AMMONIA in the last 168 hours. Coagulation Profile: No results for input(s): INR, PROTIME in the last 168 hours. Cardiac Enzymes: No results for input(s): CKTOTAL, CKMB, CKMBINDEX, TROPONINI in the last 168 hours. BNP (last 3 results) No results for input(s): PROBNP in the last 8760 hours. HbA1C: No results for input(s): HGBA1C in the last 72 hours. CBG: No results for input(s): GLUCAP in the last 168 hours. Lipid Profile: No results for input(s): CHOL, HDL, LDLCALC, TRIG, CHOLHDL, LDLDIRECT in the last 72 hours. Thyroid Function Tests: No results for input(s): TSH, T4TOTAL, FREET4, T3FREE, THYROIDAB in the last 72 hours. Anemia Panel: No results for input(s): VITAMINB12, FOLATE, FERRITIN, TIBC, IRON, RETICCTPCT in the last 72 hours. Sepsis Labs: No results for input(s): PROCALCITON, LATICACIDVEN in the last 168 hours.  Recent Results (from the past 240 hour(s))  Respiratory Panel by RT PCR (Flu A&B, Covid) - Nasopharyngeal Swab     Status: None   Collection Time: 12/07/19  4:33 PM   Specimen: Nasopharyngeal Swab  Result Value Ref Range Status   SARS Coronavirus 2 by RT PCR NEGATIVE NEGATIVE Final    Comment: (NOTE) SARS-CoV-2 target nucleic acids are NOT DETECTED. The SARS-CoV-2 RNA is generally detectable in upper respiratoy specimens during the acute phase of infection. The lowest concentration of SARS-CoV-2 viral copies this assay can detect is 131 copies/mL. A negative result does not preclude SARS-Cov-2 infection and should not be used as the sole basis for treatment or other patient management decisions. A negative result may occur with  improper specimen collection/handling, submission of specimen other than nasopharyngeal swab, presence of viral mutation(s) within  the areas targeted by this assay, and inadequate number of viral copies (<131 copies/mL). A negative result must be combined with clinical observations, patient history, and epidemiological information. The expected result is Negative. Fact Sheet for Patients:  PinkCheek.be Fact Sheet for Healthcare Providers:  GravelBags.it This test is not yet ap proved or cleared by the [place] FDA and  has been authorized for detection and/or diagnosis of SARS-CoV-2 by FDA under an Emergency Use Authorization (EUA). This EUA will remain  in effect (meaning this test can be used) for the duration of the COVID-19 declaration under Section 564(b)(1) of the Act, 21 U.S.C. section 360bbb-3(b)(1), unless the authorization is terminated or revoked sooner.    Influenza A by PCR NEGATIVE NEGATIVE Final   Influenza B by PCR NEGATIVE NEGATIVE Final    Comment: (NOTE) The Xpert Xpress SARS-CoV-2/FLU/RSV assay is intended as an aid in  the diagnosis of influenza from Nasopharyngeal swab specimens and  should not be used as a sole basis for treatment. Nasal washings and  aspirates are unacceptable for Xpert Xpress SARS-CoV-2/FLU/RSV  testing. Fact Sheet for Patients: PinkCheek.be Fact Sheet for Healthcare Providers:  GravelBags.it This test is not yet approved or cleared by the [place] and  has been authorized for detection and/or diagnosis of SARS-CoV-2 by  FDA under an Emergency Use Authorization (EUA). This EUA will remain  in effect (meaning this test can be used) for the duration of the  Covid-19 declaration under Section 564(b)(1) of the Act, 21  U.S.C. section 360bbb-3(b)(1), unless the authorization is  terminated or revoked. Performed at [facility], [place] [address] [place], [place] [address]   MRSA PCR Screening     Status: None   Collection Time:  12/07/19  9:31 PM   Specimen: Nasal Mucosa; Nasopharyngeal  Result Value Ref Range Status   MRSA by PCR NEGATIVE NEGATIVE Final    Comment:        The GeneXpert MRSA Assay (FDA approved for NASAL specimens only), is one component of a comprehensive MRSA colonization surveillance program. It is not intended to diagnose MRSA infection nor to guide or monitor treatment for MRSA infections. Performed at [facility], [place] [address] [place], [place] [address]          Radiology Studies: DG Wrist 2 Views Left  Result Date: 12/07/2019 CLINICAL DATA:  Post reduction EXAM: LEFT WRIST - 2 VIEW COMPARISON:  12/06/2018 FINDINGS: Interval casting material limits bone detail. Acute markedly comminuted intra-articular distal radius fracture with residual 1/3 shaft diameter of dorsal displacement of distal fracture fragment, though improved alignment compared to previous. Decreased angulation and impaction. Acute comminuted distal ulna fracture, also with decreased angulation and impaction. IMPRESSION: Acute comminuted intra-articular distal radius fracture with decreased impaction and displacement. Acute comminuted distal ulna fracture, also with decreased impaction and angulation Electronically Signed   By: Donavan Foil M.D.   On: 12/07/2019 16:44   DG Wrist Complete Left  Result Date: 12/07/2019 CLINICAL DATA:  Fall.  Left wrist pain and swelling.  Deformity. EXAM: LEFT WRIST - COMPLETE 3+ VIEW COMPARISON:  None. FINDINGS: There is a comminuted, impacted and dorsally displaced fracture of the distal radius, with dorsal displacement approximately 1.5 cm. Apparent intra-articular component of the fracture. The dorsal articular surface is also angulated dorsally approximately 20 degrees. There is an associated fracture of the distal ulna, mildly comminuted, also somewhat impacted and dorsally angulated. The carpus displaces with the distal radial fracture component. There is no  dislocation. No other fractures. Skeletal structures are demineralized. There is surrounding soft tissue swelling. IMPRESSION: 1. Comminuted, dorsally displaced and dorsally angulated fracture of the distal left radius with an intra-articular component, associated with a mildly comminuted, dorsally angulated fracture of the distal ulna. No dislocation. Electronically Signed   By: Lajean Manes M.D.   On: 12/07/2019 13:51   DG Hip Unilat With Pelvis 2-3 Views Left  Result Date: 12/07/2019 CLINICAL DATA:  Fall.  Left hip pain. EXAM: DG HIP (WITH OR WITHOUT PELVIS) 2-3V LEFT COMPARISON:  None. FINDINGS: Nondisplaced, non comminuted fracture across the mid left femoral neck. Mild valgus angulation. No other fractures. Hip joints, SI joints and symphysis pubis are normally aligned. Skeletal structures are demineralized. Soft tissues are unremarkable. IMPRESSION: 1. Nondisplaced, non comminuted fracture of the left mid femoral neck with mild valgus angulation. No dislocation. Electronically Signed   By: Lajean Manes M.D.   On: 12/07/2019 13:52        Scheduled Meds: . [MAR Hold] allopurinol  300 mg Oral Daily  . [MAR Hold] aspirin EC  81 mg Oral Daily  . [MAR Hold] brimonidine  1 drop Left Eye BID  . bupivacaine liposome  10 mL Infiltration Once  . chlorhexidine  60 mL Topical Once  . [MAR Hold] cholecalciferol  1,000 Units Oral Daily  . [MAR Hold] cycloSPORINE  1 drop Both Eyes BID  . [MAR Hold] docusate sodium  100 mg Oral BID  . [MAR Hold] dorzolamide  1 drop Both Eyes Daily  . feeding supplement  296 mL Oral Once  . fentaNYL  50-100 mcg Intravenous Once  . [MAR Hold] FLUoxetine  40 mg Oral Daily  . [MAR Hold] latanoprost  1 drop Both Eyes QHS  . [MAR Hold] magnesium oxide  400 mg Oral BID  . [MAR Hold] mometasone-formoterol  2 puff Inhalation BID  . [MAR Hold] mycophenolate  180 mg Oral BID  . [MAR Hold] pantoprazole  40 mg Oral Daily  . povidone-iodine  2 application Topical Once  .  [MAR Hold] predniSONE  7.5 mg Oral Q breakfast  . [MAR Hold] tacrolimus  0.5 mg Oral QHS  . [MAR Hold] tacrolimus  1 mg Oral Daily  . [MAR Hold] vitamin B-12  100 mcg Oral Daily   Continuous Infusions: . lactated ringers 50 mL/hr at 12/08/19 1310  . [MAR Hold] methocarbamol (ROBAXIN) IV       LOS: 1 day    Time spent: 25 mins,More than 50% of that time was spent in counseling and/or coordination of care.      Shelly Coss, MD [facility] Pager [phone]  If 7PM-7AM, please contact night-coverage [email or web address] Password [facility] 12/08/2019, 2:18 PM

## 2019-12-08 NOTE — Progress Notes (Signed)
Assisted Dr. Rose with left, ultrasound guided, supraclavicular block. Side rails up, monitors on throughout procedure. See vital signs in flow sheet. Tolerated Procedure well. 

## 2019-12-08 NOTE — Anesthesia Procedure Notes (Addendum)
Anesthesia Regional Block: Supraclavicular block   Pre-Anesthetic Checklist: ,, timeout performed, Correct Patient, Correct Site, Correct Laterality, Correct Procedure, Correct Position, site marked, Risks and benefits discussed,  Surgical consent,  Pre-op evaluation,  At surgeon's request and post-op pain management  Laterality: Left  Prep: chloraprep       Needles:  Injection technique: Single-shot  Needle Type: Echogenic Needle     Needle Length: 9cm      Additional Needles:   Procedures:,,,, ultrasound used (permanent image in chart),,,,  Narrative:  Start time: 12/08/2019 1:20 PM End time: 12/08/2019 1:26 PM Injection made incrementally with aspirations every 5 mL.  Performed by: Personally  Anesthesiologist: Myrtie Soman, MD  Additional Notes: Patient tolerated the procedure well without complications

## 2019-12-08 NOTE — Brief Op Note (Signed)
12/07/2019 - 12/08/2019  5:46 PM  PATIENT:  April [name]  64 y.o. female  PRE-OPERATIVE DIAGNOSIS:  LEFT FEMORAL NECK FRACTURE LEFT DISTAL RADIUS FRACTURE  POST-OPERATIVE DIAGNOSIS:  LEFT FEMORAL NECK FRACTURE  PROCEDURE:  Procedure(s): TOTAL HIP ARTHROPLASTY ANTERIOR APPROACH (Left) OPEN REDUCTION INTERNAL FIXATION (ORIF) WRIST FRACTURE (Left)  SURGEON:  Surgeon(s) and Role:    Dorna Leitz, MD - Primary  PHYSICIAN ASSISTANT:   ASSISTANTS: jim bethune   ANESTHESIA:   general  EBL:  2800 mL   BLOOD ADMINISTERED:3 CC PRBC  DRAINS: none   LOCAL MEDICATIONS USED:  MARCAINE    and OTHER experel  SPECIMEN:  No Specimen  DISPOSITION OF SPECIMEN:  N/A  COUNTS:  YES  TOURNIQUET:  * Missing tourniquet times found for documented tourniquets in log: [id number] *  DICTATION: .Other Dictation: Dictation Number [id number]  PLAN OF CARE: Admit to inpatient   PATIENT DISPOSITION:  PACU - hemodynamically stable.   Delay start of Pharmacological VTE agent (>24hrs) due to surgical blood loss or risk of bleeding: no

## 2019-12-08 NOTE — Progress Notes (Signed)
April [name] came and saw patient and ordered to be placed in a bone foam will in bed. Ortho tech notified and brought the bone foam.

## 2019-12-08 NOTE — Progress Notes (Signed)
Subjective: Persistent left hip and left arm pain with a little numbness and tingling in her fingers.   Objective: Vital signs in last 24 hours: Temp:  [97.3 F (36.3 C)-98.2 F (36.8 C)] 98.2 F (36.8 C) (01/15 1222) Pulse Rate:  [70-97] 97 (01/15 1222) Resp:  [14-25] 16 (01/15 1222) BP: (136-153)/(66-85) 144/71 (01/15 1222) SpO2:  [95 %-100 %] 96 % (01/15 1222)  Intake/Output from previous day: 01/14 0701 - 01/15 0700 In: 720 [P.O.:720] Out: 1350 [Urine:1350] Intake/Output this shift: No intake/output data recorded.  Recent Labs    12/07/19 1303  HGB 10.8*   Recent Labs    12/07/19 1303  WBC 8.7  RBC 3.95  HCT 38.0  PLT 172   Recent Labs    12/07/19 1303  NA 140  K 4.3  CL 107  CO2 25  BUN 39*  CREATININE 1.02*  GLUCOSE 113*  CALCIUM 9.6   No results for input(s): LABPT, INR in the last 72 hours.  Neurologically intact ABD soft Neurovascular intact Sensation intact distally Intact pulses distally Dorsiflexion/Plantar flexion intact No cellulitis present Compartment soft    Assessment/Plan: 64 year old female with displaced femoral neck fracture left and severely comminuted intra-articular distal radius fracture left//after discussion we feel that left total hip replacement given her young age and need for instant mobility is the appropriate course of action.  We will also plan on fixing her wrist at the same time.  She is well aware of the risks and benefits have been discussed previously.  We will take her to the operating room today for fixation.  She will be continue to be followed by the medical service given her medical complexity.     Alta Corning 12/08/2019, 1:24 PM

## 2019-12-08 NOTE — Anesthesia Procedure Notes (Signed)
Anesthesia Procedure Image    

## 2019-12-08 NOTE — Anesthesia Procedure Notes (Signed)
Procedure Name: Intubation Date/Time: 12/08/2019 1:42 PM Performed by: Myrtie Soman, MD Pre-anesthesia Checklist: Patient identified, Emergency Drugs available and Suction available Patient Re-evaluated:Patient Re-evaluated prior to induction Oxygen Delivery Method: Circle system utilized Preoxygenation: Pre-oxygenation with 100% oxygen Induction Type: IV induction Ventilation: Mask ventilation without difficulty Laryngoscope Size: Mac and 4 Grade View: Grade I Tube type: Oral Tube size: 7.0 mm Number of attempts: 1 Airway Equipment and Method: Stylet Placement Confirmation: ETT inserted through vocal cords under direct vision,  positive ETCO2 and breath sounds checked- equal and bilateral Secured at: 22 cm Tube secured with: Tape Dental Injury: Teeth and Oropharynx as per pre-operative assessment

## 2019-12-09 ENCOUNTER — Inpatient Hospital Stay (HOSPITAL_COMMUNITY): Payer: Medicare Other

## 2019-12-09 LAB — CBC WITH DIFFERENTIAL/PLATELET
Abs Immature Granulocytes: 0.23 10*3/uL — ABNORMAL HIGH (ref 0.00–0.07)
Basophils Absolute: 0 10*3/uL (ref 0.0–0.1)
Basophils Relative: 0 %
Eosinophils Absolute: 0 10*3/uL (ref 0.0–0.5)
Eosinophils Relative: 0 %
HCT: 29.1 % — ABNORMAL LOW (ref 36.0–46.0)
Hemoglobin: 9 g/dL — ABNORMAL LOW (ref 12.0–15.0)
Immature Granulocytes: 2 %
Lymphocytes Relative: 4 %
Lymphs Abs: 0.6 10*3/uL — ABNORMAL LOW (ref 0.7–4.0)
MCH: 29.1 pg (ref 26.0–34.0)
MCHC: 30.9 g/dL (ref 30.0–36.0)
MCV: 94.2 fL (ref 80.0–100.0)
Monocytes Absolute: 0.5 10*3/uL (ref 0.1–1.0)
Monocytes Relative: 4 %
Neutro Abs: 13.8 10*3/uL — ABNORMAL HIGH (ref 1.7–7.7)
Neutrophils Relative %: 90 %
Platelets: 130 10*3/uL — ABNORMAL LOW (ref 150–400)
RBC: 3.09 MIL/uL — ABNORMAL LOW (ref 3.87–5.11)
RDW: 17.2 % — ABNORMAL HIGH (ref 11.5–15.5)
WBC: 15.2 10*3/uL — ABNORMAL HIGH (ref 4.0–10.5)
nRBC: 0.1 % (ref 0.0–0.2)

## 2019-12-09 LAB — BASIC METABOLIC PANEL
Anion gap: 8 (ref 5–15)
BUN: 35 mg/dL — ABNORMAL HIGH (ref 8–23)
CO2: 23 mmol/L (ref 22–32)
Calcium: 8.1 mg/dL — ABNORMAL LOW (ref 8.9–10.3)
Chloride: 103 mmol/L (ref 98–111)
Creatinine, Ser: 1.42 mg/dL — ABNORMAL HIGH (ref 0.44–1.00)
GFR calc Af Amer: 46 mL/min — ABNORMAL LOW (ref >60)
GFR calc non Af Amer: 39 mL/min — ABNORMAL LOW (ref >60)
Glucose, Bld: 258 mg/dL — ABNORMAL HIGH (ref 70–99)
Potassium: 4.5 mmol/L (ref 3.5–5.1)
Sodium: 134 mmol/L — ABNORMAL LOW (ref 135–145)

## 2019-12-09 NOTE — Evaluation (Signed)
Occupational Therapy Evaluation Patient Details Name: April [name] MRN: [id number] DOB: [birth year] Today's Date: 12/09/2019    History of Present Illness Pt is a 64 year old woman admitted on 12/07/19 after falling in her kitchen and sustaining L wrist and L hip fractures. Underwent ORIF of L wrist and repair of forearm laceration and  L anterior total hip arthroplasty. PMH: arthritis, kidney transplant 2013, asthma.   Clinical Impression   Pt was functioning modified independently with RW prior to admission. Presents with L hip pain, generalized weakness and decreased standing balance. She requires +2 assistance for OOB activity and min to total assist for ADL. Pt has availability of family 24 hours, but they are able to offer minimal physical assistance. Recommending ST rehab in SNF. Will follow acutely.    Follow Up Recommendations  SNF;Supervision/Assistance - 24 hour    Equipment Recommendations  Other (comment)(defer to next venue)    Recommendations for Other Services       Precautions / Restrictions Precautions Precautions: Fall Required Braces or Orthoses: Sling Restrictions Weight Bearing Restrictions: Yes LUE Weight Bearing: Weight bear through elbow only LLE Weight Bearing: Weight bearing as tolerated Other Position/Activity Restrictions: no L hip abduction      Mobility Bed Mobility Overal bed mobility: Needs Assistance Bed Mobility: Supine to Sit     Supine to sit: Max assist     General bed mobility comments: verbal cues for technique, assist for L LE over EOB, to raise trunk and position hips at EOB with bed pad  Transfers Overall transfer level: Needs assistance Equipment used: Rolling walker (2 wheeled) Transfers: Sit to/from [facility] Sit to Stand: +2 physical assistance;Mod assist Stand pivot transfers: +2 physical assistance;Mod assist       General transfer comment: verbal cues for technique, assist to rise and steady  from bed and BSC    Balance Overall balance assessment: Needs assistance Sitting-balance support: Single extremity supported;Feet supported Sitting balance-Leahy Scale: Fair     Standing balance support: Bilateral upper extremity supported Standing balance-Leahy Scale: Poor                             ADL either performed or assessed with clinical judgement   ADL Overall ADL's : Needs assistance/impaired Eating/Feeding: Minimal assistance;Sitting   Grooming: Sitting;Minimal assistance Grooming Details (indicate cue type and reason): total assist to put hair up Upper Body Bathing: Moderate assistance;Sitting   Lower Body Bathing: Total assistance;+2 for physical assistance;Sit to/from stand   Upper Body Dressing : Sitting;Moderate assistance Upper Body Dressing Details (indicate cue type and reason): front opening gown Lower Body Dressing: Total assistance;Sit to/from stand;+2 for physical assistance   Toilet Transfer: +2 for physical assistance;Stand-pivot;BSC;RW;Moderate assistance   Toileting- Clothing Manipulation and Hygiene: +2 for physical assistance;Total assistance;Sit to/from stand               Vision Baseline Vision/History: Wears glasses Wears Glasses: At all times Patient Visual Report: No change from baseline       Perception     Praxis      Pertinent Vitals/Pain Pain Assessment: Faces Faces Pain Scale: Hurts even more Pain Location: L hip Pain Descriptors / Indicators: Burning;Grimacing;Guarding Pain Intervention(s): Monitored during session;Patient requesting pain meds-RN notified;RN gave pain meds during session;Repositioned     Hand Dominance Right   Extremity/Trunk Assessment Upper Extremity Assessment Upper Extremity Assessment: LUE deficits/detail LUE Deficits / Details: splinted from forearm to MPs, full shoulder  ROM, edematous fingers with bruising LUE: Unable to fully assess due to immobilization LUE Coordination:  decreased fine motor   Lower Extremity Assessment Lower Extremity Assessment: Defer to PT evaluation   Cervical / Trunk Assessment Cervical / Trunk Assessment: Normal   Communication Communication Communication: No difficulties   Cognition Arousal/Alertness: Awake/alert Behavior During Therapy: WFL for tasks assessed/performed Overall Cognitive Status: Within Functional Limits for tasks assessed                                     General Comments       Exercises     Shoulder Instructions      Home Living Family/patient expects to be discharged to:: Private residence Living Arrangements: Spouse/significant other;Children;Parent(husband, son and mother) Available Help at Discharge: Family;Available 24 hours/day(can provide minimum assistance) Type of Home: House Home Access: Ramped entrance     Home Layout: Two level;Able to live on main level with bedroom/bathroom     Bathroom Shower/Tub: Occupational psychologist: Standard     Home Equipment: Environmental consultant - 2 wheels;Wheelchair - [employer];Shower seat;Adaptive equipment Adaptive Equipment: Reacher;Sock aid        Prior Functioning/Environment Level of Independence: Independent with assistive device(s)        Comments: ambulates with RW        OT Problem List: Decreased strength;Impaired balance (sitting and/or standing);Decreased knowledge of use of DME or AE;Pain;Impaired UE functional use      OT Treatment/Interventions: Self-care/ADL training;Patient/family education;Balance training;Therapeutic activities;DME and/or AE instruction    OT Goals(Current goals can be found in the care plan section) Acute Rehab OT Goals Patient Stated Goal: to return to PLOF OT Goal Formulation: With patient Time For Goal Achievement: 12/23/19 Potential to Achieve Goals: Good ADL Goals Pt Will Perform Grooming: with supervision;with min assist;standing Pt Will Perform Lower Body Bathing:  with min assist;with adaptive equipment;sit to/from stand Pt Will Perform Lower Body Dressing: with min assist;with adaptive equipment;sit to/from stand Pt Will Transfer to Toilet: with min assist;ambulating;bedside commode Pt Will Perform Toileting - Clothing Manipulation and hygiene: with min assist;sit to/from stand Additional ADL Goal #1: Pt will perform bed mobility with min assist in preparation for ADL.  OT Frequency: Min 2X/week   Barriers to D/C: Decreased caregiver support          Co-evaluation PT/OT/SLP Co-Evaluation/Treatment: Yes Reason for Co-Treatment: For patient/therapist safety   OT goals addressed during session: ADL's and self-care;Proper use of Adaptive equipment and DME      AM-PAC OT "6 Clicks" Daily Activity     Outcome Measure Help from another person eating meals?: A Little Help from another person taking care of personal grooming?: A Little Help from another person toileting, which includes using toliet, bedpan, or urinal?: Total Help from another person bathing (including washing, rinsing, drying)?: A Lot Help from another person to put on and taking off regular upper body clothing?: A Little Help from another person to put on and taking off regular lower body clothing?: Total 6 Click Score: 13   End of Session Equipment Utilized During Treatment: Gait belt;Rolling walker Nurse Communication: Patient requests pain meds  Activity Tolerance: Patient tolerated treatment well Patient left: in chair;with call bell/phone within reach  OT Visit Diagnosis: Unsteadiness on feet (R26.81);Pain;Muscle weakness (generalized) (M62.81)                Time: 9211-9417 OT Time Calculation (min):  34 min Charges:  OT General Charges $OT Visit: 1 Visit OT Evaluation $OT Eval Moderate Complexity: S[place], OTR/L Acute Rehabilitation Services Pager: [phone] Office: [phone]  Malka So 12/09/2019, 2:31 PM

## 2019-12-09 NOTE — Progress Notes (Signed)
  PATIENT ID: April [name]  MRN: [id number]  DOB/AGE:  [birth year] / 64 y.o.  1 Day Post-Op Procedure(s) (LRB): TOTAL HIP ARTHROPLASTY ANTERIOR APPROACH (Left) OPEN REDUCTION INTERNAL FIXATION (ORIF) WRIST FRACTURE, REPAIR LACERATION LEFT FOREARM (Left)  Subjective: Pain is moderate.  No c/o chest pain or SOB.   Tol PO liquids and PB crackers thus far   Objective: Vital signs in last 24 hours: Temp:  [97.5 F (36.4 C)-99.2 F (37.3 C)] 97.7 F (36.5 C) (01/16 0405) Pulse Rate:  [72-103] 72 (01/16 0405) Resp:  [10-40] 16 (01/16 0405) BP: (101-151)/(60-84) 113/66 (01/16 0405) SpO2:  [94 %-100 %] 94 % (01/16 0757)  Intake/Output from previous day: 01/15 0701 - 01/16 0700 In: 6706.5 [I.V.:4841.5; Blood:1065; IV [id number]:701] Out: 7793 [Urine:725; Blood:2800] Intake/Output this shift: No intake/output data recorded.  Recent Labs    12/07/19 1303 12/08/19 1518 12/08/19 1736 12/09/19 0457  HGB 10.8* 11.9* 10.5* 9.0*   Recent Labs    12/07/19 1303 12/08/19 1518 12/08/19 1736 12/09/19 0457  WBC 8.7  --   --  15.2*  RBC 3.95  --   --  3.09*  HCT 38.0   < > 31.0* 29.1*  PLT 172  --   --  130*   < > = values in this interval not displayed.   Recent Labs    12/07/19 1303 12/08/19 1518 12/08/19 1736 12/09/19 0457  NA 140   < > 138 134*  K 4.3   < > 4.9 4.5  CL 107  --  104 103  CO2 25  --   --  23  BUN 39*  --  28* 35*  CREATININE 1.02*  --  1.20* 1.42*  GLUCOSE 113*  --  225* 258*  CALCIUM 9.6  --   --  8.1*   < > = values in this interval not displayed.   No results for input(s): LABPT, INR in the last 72 hours.  Physical Exam: L UE splint intact.  Intact LT sens R/M/U distribution with intact motor,  subjectively diminished @ IF. L hip dressing mostly clean/dry.  Intact LT sens P/D/1web, f/e toes.  Assessment/Plan: 1 Day Post-Op Procedure(s) (LRB): TOTAL HIP ARTHROPLASTY ANTERIOR APPROACH (Left) OPEN REDUCTION INTERNAL FIXATION (ORIF) WRIST FRACTURE,  REPAIR LACERATION LEFT FOREARM (Left)   Acute blood-loss anemia-HCT 9, but normotensive and not tachycardic   Advance diet Weight Bearing as Tolerated (WBAT) L LE, but no active abduction exercises WBAT L FA with platform, but not on hand/wrist VTE prophylaxis: SCDs Will follow while inpatient.   Can d/c from surgical perspective once clears PT for d/c and determined to be medically stable.  Rayvon Char [address], [place], [place]  [address] Office: [phone] Mobile: [phone]  12/09/2019, 8:57 AM

## 2019-12-09 NOTE — Op Note (Signed)
NAME: April [name], April [name] MEDICAL RECORD AL:[id number] ACCOUNT [id number] DATE OF BIRTH:11-22-1956 FACILITY: [facility] LOCATION: [facility] PHYSICIAN:Lamar Meter L. Katherine Syme, MD  OPERATIVE REPORT  DATE OF PROCEDURE:  12/08/2019  PREOPERATIVE DIAGNOSES:   1.  Femoral neck fracture, left. 2.  Comminuted intraarticular distal radius fracture.  POSTOPERATIVE DIAGNOSES: 1.  Femoral neck fracture, left. 2.  Comminuted intraarticular distal radius fracture. 3.  Comminuted proximal femur fracture. 4.  Comminuted greater trochanteric fracture. 5.  Laceration of 3 cm dorsal left arm.  PROCEDURE:   1.  Left total hip replacement with a Corail stem size 16, 48 mm Pinnacle Gription cup, neutral liner and a 32 mm +5 metal hip ball including open reduction internal fixation of proximal femur with Dall-Miles cable4 severely comminuted proximal femur fracture including femoral Mack. 2.  Open reduction internal fixation of comminuted greater trochanteric fracture with suture repair. 3.  Open reduction internal fixation of severely comminuted distal radius intraarticular fracture with fixation of greater than 3 articular fragments. 4.  Repair of 3 cm laceration dorsal forearm. 5.  Interpretation of multiple intraoperative fluoroscopic images of the hip. 6.  Interpretation of multiple intraoperative fluoroscopic images of the distal radius fracture  SURGEON:  Dorna Leitz, MD  ASSISTANT:  Gary Fleet PA-C, was present for the entire case and assisted by retraction of tissues, manipulation of leg and arm and  closing to minimize OR time.  BRIEF HISTORY:  The patient is a 64 year old female with a history of severe renal condition and ultimately underwent transplant several years ago.  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  I toyed with the idea of getting a CAT scan, but I felt that given  her age and severe poor bone quality, that total hip replacement would be the appropriate course of action.  We brought  her to the operating room for that.  She had a severely comminuted displaced distal radius fracture, which was reduced in the emergency room and we felt that that needed fixation as well and our plan was to fixate that as well.  She was brought to the  operating room for these procedures.  DESCRIPTION OF PROCEDURE:  The patient was taken to the operating room.  After adequate anesthesia obtained with a general anesthetic, the patient was placed supine on the Hana bed.  All bony prominences were well padded.  Attention was turned to the  left hip where after routine prep and drape, an incision was made for an anterior approach to the hip.  Subcutaneous tissue was dissected down to the level of the tensor fascia, divided in line with its fibers.  Muscle finger dissected and retractors put  into place above and below the femoral neck.  At this point, the femoral neck fracture could be identified.  It was obviously displaced.  I did a provisional neck cut just below that and went ahead and removed this bone.  Once this was done, the hip was  externally rotated and we put retractors above and below the acetabulum.  The acetabulum was exposed.  Labrectomy was performed, sequentially reamed to a level of 47 mm and a 48 mm Pinnacle Gription cup was hammered into place, 45 degrees of lateral  opening, 30 degrees of anteversion.  A hole eliminator was placed and final neutral poly.  Attention was then turned to the stem side where a retractor was placed on  the medial neck and unfortunately on placement of the retractor, a large piece medially  just came into the wound.  Unclear whether this had been fractured at the time of her fall or whether this was just fractured based on placement of the retractor.  At this point, we did a release of the posterior capsule and then putting the retractor   behind the greater, the greater now was fractured and were sort of left with not much in terms of greater trochanter.  We were able to use some different retractors, kind of got this retracted and with external rotation, extension and adduction, we were  able to get the femoral neck exposed.  At this point, I thought that the best course of action would be to try to get a Corail to fit in if it would and if the Corail would fit, then I would leave it.  If not, I was going to have to cement in something  just to get some kind of fixation.  At this point, I gently sort of broached her up, got to a 16 broach and actually got a really good purchase.  I was happy with that and did a reduction with a hip ball, assessed the leg length situation and then  redislocated the hip.  At that point, the rasp did not move at all.  Because of that fractured portion and the concern for propagation, at this point, I felt like she needed an open reduction internal fixation of the proximal femur.  At this point, a Dall-Miles cable passer was used around the proximal femur and just  above the lesser trochanter.  The cable was tightened, not excessively so, but tightened with our broach in place and then the broach was removed and the final 16  Corail stem was opened and placed.  I took some bone graft that had been removed earlier and used it to help in that lateral position of the Corail to try to help bolster up that fit.  The greater trochanter at this point really was fractured.  I did not  feel there was any ability to fixate this or that it would add to the stability at all.  So at this point, I used sutures to sew portions of the greater trochanter back down to the lateral proximal femur. The final images were taken after we put in the final +5 metal hip ball.  Excellent symmetric leg lengths were achieved.  Stability was excellent.   At this point, the patient had the capsule closed and the tensor fascia was closed with  0 Vicryl running.  The capsule was closed with #1 Vicryl, tensor fascia closed with 0 Vicryl running.  The skin was closed with 0 and 2-0 Vicryl and skin staples.   A sterile compressive dressing was applied.  Final fluoroscopic images were taken, which showed anatomic alignment of her hips.  The estimated blood loss for this procedure was 2700 mL according to anesthesia and we obviously had concerns about her  hemodynamic stability.  I talked to Dr. Jillyn Hidden, the anesthesiologist in charge of her care, about whether proceeding with her wrist was appropriate given her hemodynamic situation.  He felt that it was at that point, I encouraged them aggressively for  resuscitation and they gave her 3 units of packed cells.  She hemodynamically was stable the entire time, but certainly did not want her to get into an unstable position based on the blood loss from the hip.  Predominantly,  we did not see any bleeding,  which was a long case and there was just ooze throughout the case.  I used topical tranexamic acid on 2 separate occasions to try to help curtail some of the blood loss.  At this point, attention was turned away from the total hip and open reduction  internal fixation of the comminuted femur fracture to the wrist.  A brand new prep and drape was used, new gowning and gloving for everybody.  The left wrist was prepped and draped in the usual sterile fashion.  At this time, we noted when we pulled off her splint  that she had a 3 cm laceration on the dorsal aspect of her forearm and felt that this needed to be repaired as well.  We prepped and draped her in the usual sterile fashion as outlined.  After exsanguinating the wrist, blood pressure tourniquet was  inflated to 350 mmHg.  An incision was made over the volar wrist.  Subcutaneous ____ to the level of the flexor carpi radialis, which was clearly identified, sheath opened, retracted radially and then the floor was explored down to the pronator   quadratus, which was released on its radial side giving access to the fracture.  Once the fracture was exposed, we did a manipulative closed reduction and a plate was put in place with the provisional fixation with a K-wire after manipulative closed  reduction and given near anatomic reduction of the distal radius fracture.  Once we were satisfied with the positioning of the plate, a screw was placed.  We then under fluoroscopy made sure that the plate was positioned well and once it was, I held it  in a reduced position and we then put 4 smooth pegs in the proximal row.  Once this was done, we used fluoroscopy.  The radial styloid peg was a little long.  We changed it from an 18 to a 14 and then went and put the 3 pegs in the distal row.  X-rays  were taken at this point, showed that we had an excellent reduction of the distal radius fracture.  We then put screws in the standard fashion in the proximal forearm.  At this point, the wounds were copiously irrigated, suctioned dry.  We then took the  pronator quadratus and repaired it.  Fluoroscopic images were taken multiple times throughout the case to ensure proper position of the plate as well as reduction of the fracture.We then closed the skin with 2-0 Vicryl and nylon.  At this point, attention was turned to the dorsal forearm where the 3 cm laceration was identified and repaired with nylon sterile sutures.  We then put her into a  sugar tong splint.  Sterile compressive dressing was applied.  She was taken to recovery.  She was noted to be in satisfactory condition.  Anesthesia will decide in the recovery room based on her hemodynamic status and her extubation status and overall  i-STAT recordings where she should go, whether she needs to go to a regular bed or whether she go to a step-down unit overnight.  There was no blood loss for this procedure, so the total blood loss for the entire procedure was 2700 mL.  Of note, we used  fluoroscopy for both  the wrist and the hip to make sure that everything was adequately aligned, reduced and replaced.  Note, Gaspar Skeeters was present for the entire case, assisted by retraction of tissues, manipulation of the bone and closing to minimize  OR time.  VN/NUANCE  D:12/08/2019 T:12/08/2019 JOB:[id number]

## 2019-12-09 NOTE — Evaluation (Signed)
Physical Therapy Evaluation Patient Details Name: April [name] MRN: [id number] DOB: 30-Jul-1956 Today's Date: 12/09/2019   History of Present Illness  Pt is a 64 year old woman admitted on 12/07/19 after falling in her kitchen and sustaining L wrist and L hip fractures. Underwent ORIF of L wrist and repair of forearm laceration and  L anterior total hip arthroplasty. PMH: arthritis, kidney transplant 2013, asthma.  Clinical Impression  Pt admitted as above and presenting with functional mobility limitations 2* generalized weakness, premorbid deconditioning, balance deficits, L wrist fx, decreased L LE strength/ROM and post op pain.  Pt would benefit from follow up rehab at SNF level to maximize IND and safety prior to return home with limited assist.    Follow Up Recommendations SNF    Equipment Recommendations  Other (comment)(Platform for RW if pt returns home)    Recommendations for Other Services OT consult     Precautions / Restrictions Precautions Precautions: Fall Required Braces or Orthoses: Sling Restrictions Weight Bearing Restrictions: Yes LUE Weight Bearing: Weight bear through elbow only LLE Weight Bearing: Weight bearing as tolerated Other Position/Activity Restrictions: no L hip active abduction      Mobility  Bed Mobility Overal bed mobility: Needs Assistance Bed Mobility: Supine to Sit     Supine to sit: Max assist     General bed mobility comments: verbal cues for technique, assist for L LE over EOB, to raise trunk and position hips at EOB with bed pad  Transfers Overall transfer level: Needs assistance Equipment used: Rolling walker (2 wheeled) Transfers: Sit to/from [facility] Sit to Stand: +2 physical assistance;Mod assist Stand pivot transfers: +2 physical assistance;Mod assist       General transfer comment: verbal cues for technique, assist to rise and steady from bed and [facility]  Ambulation/Gait             General  Gait Details: Pt tolerated stand/pvt transfers to [facility] and to recliner   Stairs            Wheelchair Mobility    Modified Rankin (Stroke Patients Only)       Balance Overall balance assessment: Needs assistance Sitting-balance support: Single extremity supported;Feet supported Sitting balance-Leahy Scale: Fair     Standing balance support: Bilateral upper extremity supported Standing balance-Leahy Scale: Poor                               Pertinent Vitals/Pain Pain Assessment: Faces Faces Pain Scale: Hurts even more Pain Location: L hip Pain Descriptors / Indicators: Burning;Grimacing;Guarding Pain Intervention(s): Limited activity within patient's tolerance;Monitored during session;Patient requesting pain meds-RN notified;RN gave pain meds during session    Home Living Family/patient expects to be discharged to:: Private residence Living Arrangements: Spouse/significant other;Children;Parent Available Help at Discharge: Family;Available 24 hours/day Type of Home: House Home Access: Ramped entrance     Home Layout: Two level;Able to live on main level with bedroom/bathroom Home Equipment: Gilford Rile - 2 wheels;Wheelchair - [employer];Shower seat;Adaptive equipment Additional Comments: Pt lives with family but all members are restricted in ability to assist pt    Prior Function Level of Independence: Independent with assistive device(s)         Comments: ambulates with RW     Hand Dominance   Dominant Hand: Right    Extremity/Trunk Assessment   Upper Extremity Assessment Upper Extremity Assessment: Defer to OT evaluation LUE Deficits / Details: splinted from forearm to MPs,  full shoulder ROM, edematous fingers with bruising LUE Coordination: decreased fine motor    Lower Extremity Assessment Lower Extremity Assessment: LLE deficits/detail    Cervical / Trunk Assessment Cervical / Trunk Assessment: Normal  Communication    Communication: No difficulties  Cognition Arousal/Alertness: Awake/alert Behavior During Therapy: WFL for tasks assessed/performed Overall Cognitive Status: Within Functional Limits for tasks assessed                                        General Comments      Exercises     Assessment/Plan    PT Assessment Patient needs continued PT services  PT Problem List Decreased strength;Decreased range of motion;Decreased activity tolerance;Decreased balance;Decreased mobility;Decreased knowledge of use of DME;Obesity;Pain       PT Treatment Interventions DME instruction;Gait training;Functional mobility training;Therapeutic activities;Therapeutic exercise;Balance training;Patient/family education    PT Goals (Current goals can be found in the Care Plan section)  Acute Rehab PT Goals Patient Stated Goal: to return to PLOF PT Goal Formulation: With patient Time For Goal Achievement: 12/23/19 Potential to Achieve Goals: Fair    Frequency Min 5X/week   Barriers to discharge Decreased caregiver support All members of family restricted in ability to assist pt    Co-evaluation PT/OT/SLP Co-Evaluation/Treatment: Yes Reason for Co-Treatment: For patient/therapist safety PT goals addressed during session: Mobility/safety with mobility OT goals addressed during session: ADL's and self-care       AM-PAC PT "6 Clicks" Mobility  Outcome Measure Help needed turning from your back to your side while in a flat bed without using bedrails?: A Lot Help needed moving from lying on your back to sitting on the side of a flat bed without using bedrails?: A Lot Help needed moving to and from a bed to a chair (including a wheelchair)?: A Lot Help needed standing up from a chair using your arms (e.g., wheelchair or bedside chair)?: A Lot Help needed to walk in hospital room?: A Lot Help needed climbing 3-5 steps with a railing? : Total 6 Click Score: 11    End of Session Equipment  Utilized During Treatment: Gait belt Activity Tolerance: Patient limited by fatigue;Patient limited by pain Patient left: in chair;with call bell/phone within reach;with chair alarm set Nurse Communication: Mobility status PT Visit Diagnosis: Difficulty in walking, not elsewhere classified (R26.2);Muscle weakness (generalized) (M62.81)    Time: 3532-9924 PT Time Calculation (min) (ACUTE ONLY): 32 min   Charges:   PT Evaluation $PT Eval Moderate Complexity: [name] Pager [phone] Office [phone]   Alois Mincer 12/09/2019, 4:00 PM

## 2019-12-09 NOTE — Progress Notes (Signed)
Physical Therapy Treatment Patient Details Name: April [name] MRN: [id number] DOB: 08-25-1956 Today's Date: 12/09/2019    History of Present Illness Pt is a 64 year old woman admitted on 12/07/19 after falling in her kitchen and sustaining L wrist and L hip fractures. Underwent ORIF of L wrist and repair of forearm laceration and  L anterior total hip arthroplasty. PMH: arthritis, kidney transplant 2013, asthma.    PT Comments     Pt continues cooperative but requiring increased time, multiple rest breaks and significant assist for completion of any/all mobility tasks.   Follow Up Recommendations  CIR     Equipment Recommendations  Other (comment)    Recommendations for Other Services OT consult     Precautions / Restrictions Precautions Precautions: Fall Required Braces or Orthoses: Sling Restrictions Weight Bearing Restrictions: Yes LUE Weight Bearing: Weight bear through elbow only LLE Weight Bearing: Weight bearing as tolerated Other Position/Activity Restrictions: no L hip active abduction    Mobility  Bed Mobility Overal bed mobility: Needs Assistance Bed Mobility: Sit to Supine     Supine to sit: Max assist Sit to supine: Mod assist;+2 for physical assistance;+2 for safety/equipment   General bed mobility comments: cues for sequence, use of R LE to self assist.  Physical assist to manage LEs and to control trunk  Transfers Overall transfer level: Needs assistance Equipment used: Rolling walker (2 wheeled) Transfers: Sit to/from [facility] Sit to Stand: +2 physical assistance;Mod assist Stand pivot transfers: +2 physical assistance;Mod assist       General transfer comment: verbal cues for technique, assist to rise and steady from recliner; stand pvt to [facility]  Ambulation/Gait Ambulation/Gait assistance: Min assist;Mod assist;+2 physical assistance;+2 safety/equipment Gait Distance (Feet): 1 Feet Assistive device: Left platform  walker Gait Pattern/deviations: Step-to pattern;Decreased step length - right;Decreased step length - left;Shuffle;Trunk flexed     General Gait Details: Pt able to take several small steps to step back to bed but unable to complete task and bed pulled up behind    Stairs             Wheelchair Mobility    Modified Rankin (Stroke Patients Only)       Balance Overall balance assessment: Needs assistance Sitting-balance support: Single extremity supported;Feet supported Sitting balance-Leahy Scale: Fair     Standing balance support: Bilateral upper extremity supported Standing balance-Leahy Scale: Poor                              Cognition Arousal/Alertness: Awake/alert Behavior During Therapy: WFL for tasks assessed/performed Overall Cognitive Status: Within Functional Limits for tasks assessed                                        Exercises      General Comments        Pertinent Vitals/Pain Pain Assessment: Faces Faces Pain Scale: Hurts even more Pain Location: L hip Pain Descriptors / Indicators: Burning;Grimacing;Guarding Pain Intervention(s): Limited activity within patient's tolerance;Monitored during session;Premedicated before session    Home Living Family/patient expects to be discharged to:: Private residence Living Arrangements: Spouse/significant other;Children;Parent Available Help at Discharge: Family;Available 24 hours/day Type of Home: House Home Access: Ramped entrance   Home Layout: Two level;Able to live on main level with bedroom/bathroom Home Equipment: Gilford Rile - 2 wheels;Wheelchair - [employer];Shower seat;Adaptive equipment Additional  Comments: Pt lives with family but all members are restricted in ability to assist pt    Prior Function Level of Independence: Independent with assistive device(s)      Comments: ambulates with RW   PT Goals (current goals can now be found in the care plan  section) Acute Rehab PT Goals Patient Stated Goal: to return to PLOF PT Goal Formulation: With patient Time For Goal Achievement: 12/23/19 Potential to Achieve Goals: Fair Progress towards PT goals: Progressing toward goals    Frequency    Min 5X/week      PT Plan Discharge plan needs to be updated    Co-evaluation PT/OT/SLP Co-Evaluation/Treatment: Yes Reason for Co-Treatment: For patient/therapist safety PT goals addressed during session: Mobility/safety with mobility OT goals addressed during session: ADL's and self-care      AM-PAC PT "6 Clicks" Mobility   Outcome Measure  Help needed turning from your back to your side while in a flat bed without using bedrails?: A Lot Help needed moving from lying on your back to sitting on the side of a flat bed without using bedrails?: A Lot Help needed moving to and from a bed to a chair (including a wheelchair)?: A Lot Help needed standing up from a chair using your arms (e.g., wheelchair or bedside chair)?: A Lot Help needed to walk in hospital room?: A Lot Help needed climbing 3-5 steps with a railing? : Total 6 Click Score: 11    End of Session Equipment Utilized During Treatment: Gait belt Activity Tolerance: Patient limited by fatigue;Patient limited by pain Patient left: in bed;with call bell/phone within reach;with bed alarm set;with nursing/sitter in room Nurse Communication: Mobility status PT Visit Diagnosis: Difficulty in walking, not elsewhere classified (R26.2);Muscle weakness (generalized) (M62.81)     Time: 1610-9604 PT Time Calculation (min) (ACUTE ONLY): 29 min  Charges:  $Therapeutic Activity: 23-37 mins                     [place] Pager [phone] Office [phone]    Tanav Orsak 12/09/2019, 5:04 PM

## 2019-12-09 NOTE — Progress Notes (Signed)
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  X-ray imaging showed nondisplaced, noncomminuted fracture of the left mid femoral neck with mid to valgus angulation, comminuted fracture of the distal left radius.  Orthopedics consulted.  Status post left total hip arthroplasty and ORIF of left wrist fracture, repair of laceration of left forearm by orthopedics on 12/08/2019.  Pending PT/OT evaluation  Assessment & Plan:   Principal Problem:   Closed left hip fracture (HCC) Active Problems:   End stage renal disease (HCC)   Asthma, mild intermittent   GERD (gastroesophageal reflux disease)   Renal transplant recipient   Hyperlipidemia   Chronic diastolic CHF (congestive heart failure) (HCC)   Fracture   Left wrist fracture   Closed left hip fracture after mechanical fall: Orthopedics following.  X-ray finding as above.  Status post left total hip arthroplasty  by orthopedics on 12/08/2019.  Started on aspirin for DVT prophylaxis .pending PT/OT evaluation. Might need rehab on discharge  Left wrist fracture: Currently in sling.  S/P  ORIF of left wrist fracture, repair of laceration of left forearm by orthopedics on 12/08/2019.  Leukocytosis: Elevated WC count today.  No fever.  Will check x-ray to rule out pneumonia.  Also noted to be hypoxic on room air earlier.  AKI: Creatinine trended up to 1.4.Check BMP tomorrow  Polycystic kidney disease, end-stage renal disease status post renal transplant: On prednisone, Prograf, tacrolimus, mycophenolate, Lasix at home.  Follows with Dr. Jimmy Footman  Mild intermittent asthma: Currently stable.  Not wheezing.  On inhalers at home.  GERD: Continue PPI  Chronic  diastolic CHF: Currently euvolemic.  On Lasix at home.Restarted         DVT prophylaxis:SCD Code Status: Full code Family Communication: Discussed with patient Disposition Plan: Likely skilled nursing facility.  Pending PT/OT evaluation   Consultants: Orthopedics  Procedures: None  Antimicrobials:  Anti-infectives (From admission, onward)   Start     Dose/Rate Route Frequency Ordered Stop   12/08/19 2200  ceFAZolin (ANCEF) IVPB 2g/100 mL premix     2 g 200 mL/hr over 30 Minutes Intravenous Every 6 hours 12/08/19 2053 12/09/19 0559   12/08/19 0600  ceFAZolin (ANCEF) IVPB 2g/100 mL premix     2 g 200 mL/hr over 30 Minutes Intravenous On call to O.R. 12/07/19 2132 12/08/19 1412      Subjective:  Patient seen and examined at the bedside this morning.  Hemodynamically stable.  She appeared little uncomfortable, more weak, jittery today.  She says her pain is well controlled.  She was on 2 L of oxygen per minute.  I am monitoring  her on room air.  Objective: Vitals:   12/08/19 2303 12/09/19 0405 12/09/19 0757 12/09/19 0939  BP: 110/61 113/66  (!) 111/53  Pulse: 81 72  77  Resp: 12 16  16   Temp: (!) 97.5 F (36.4 C) 97.7 F (36.5 C)  97.8 F (36.6 C)  TempSrc: Oral Oral  Oral  SpO2: 100% 100% 94% (!) 88%  Weight:      Height:        Intake/Output Summary (Last 24 hours) at 12/09/2019 1339 Last data filed at 12/09/2019 1000 Gross per 24 hour  Intake 7380 ml  Output 3525 ml  Net  3855 ml   Filed Weights   12/07/19 1304  Weight: 79.4 kg    Examination:  General exam: Generalized weakness  HEENT:PERRL, Ear/Nose normal on gross exam Respiratory system: Clear to auscultation bilaterally Cardiovascular system: S1 & S2 heard, RRR. No JVD, murmurs, rubs, gallops or clicks. No pedal edema. Gastrointestinal system: Abdomen is nondistended, soft and nontender. No organomegaly or masses felt. Normal bowel sounds heard. Central nervous system: Alert and oriented. No focal  neurological deficits. Extremities: No edema, no clubbing ,no cyanosis, sling on the left arm, decreased range of motion on the left upper and lower extremity, tenderness on the left hip .  Clean surgical wound on the left hip skin: No rashes, lesions or ulcers,no icterus ,no pallor     Data Reviewed: I have personally reviewed following labs and imaging studies  CBC: Recent Labs  Lab 12/07/19 1303 12/08/19 1518 12/08/19 1736 12/09/19 0457  WBC 8.7  --   --  15.2*  NEUTROABS 7.6  --   --  13.8*  HGB 10.8* 11.9* 10.5* 9.0*  HCT 38.0 35.0* 31.0* 29.1*  MCV 96.2  --   --  94.2  PLT 172  --   --  595*   Basic Metabolic Panel: Recent Labs  Lab 12/07/19 1303 12/08/19 1518 12/08/19 1736 12/09/19 0457  NA 140 137 138 134*  K 4.3 4.8 4.9 4.5  CL 107  --  104 103  CO2 25  --   --  23  GLUCOSE 113*  --  225* 258*  BUN 39*  --  28* 35*  CREATININE 1.02*  --  1.20* 1.42*  CALCIUM 9.6  --   --  8.1*   GFR: Estimated Creatinine Clearance: 39.2 mL/min (A) (by C-G formula based on SCr of 1.42 mg/dL (H)). Liver Function Tests: No results for input(s): AST, ALT, ALKPHOS, BILITOT, PROT, ALBUMIN in the last 168 hours. No results for input(s): LIPASE, AMYLASE in the last 168 hours. No results for input(s): AMMONIA in the last 168 hours. Coagulation Profile: No results for input(s): INR, PROTIME in the last 168 hours. Cardiac Enzymes: No results for input(s): CKTOTAL, CKMB, CKMBINDEX, TROPONINI in the last 168 hours. BNP (last 3 results) No results for input(s): PROBNP in the last 8760 hours. HbA1C: No results for input(s): HGBA1C in the last 72 hours. CBG: No results for input(s): GLUCAP in the last 168 hours. Lipid Profile: No results for input(s): CHOL, HDL, LDLCALC, TRIG, CHOLHDL, LDLDIRECT in the last 72 hours. Thyroid Function Tests: No results for input(s): TSH, T4TOTAL, FREET4, T3FREE, THYROIDAB in the last 72 hours. Anemia Panel: No results for input(s): VITAMINB12,  FOLATE, FERRITIN, TIBC, IRON, RETICCTPCT in the last 72 hours. Sepsis Labs: No results for input(s): PROCALCITON, LATICACIDVEN in the last 168 hours.  Recent Results (from the past 240 hour(s))  Respiratory Panel by RT PCR (Flu A&B, Covid) - Nasopharyngeal Swab     Status: None   Collection Time: 12/07/19  4:33 PM   Specimen: Nasopharyngeal Swab  Result Value Ref Range Status   SARS Coronavirus 2 by RT PCR NEGATIVE NEGATIVE Final    Comment: (NOTE) SARS-CoV-2 target nucleic acids are NOT DETECTED. The SARS-CoV-2 RNA is generally detectable in upper respiratoy specimens during the acute phase of infection. The lowest concentration of SARS-CoV-2 viral copies this assay can detect is 131 copies/mL. A negative result does not preclude SARS-Cov-2 infection and should not be used as the sole basis for treatment or other patient management decisions. A negative  result may occur with  improper specimen collection/handling, submission of specimen other than nasopharyngeal swab, presence of viral mutation(s) within the areas targeted by this assay, and inadequate number of viral copies (<131 copies/mL). A negative result must be combined with clinical observations, patient history, and epidemiological information. The expected result is Negative. Fact Sheet for Patients:  PinkCheek.be Fact Sheet for Healthcare Providers:  GravelBags.it This test is not yet ap proved or cleared by the [place] FDA and  has been authorized for detection and/or diagnosis of SARS-CoV-2 by FDA under an Emergency Use Authorization (EUA). This EUA will remain  in effect (meaning this test can be used) for the duration of the COVID-19 declaration under Section 564(b)(1) of the Act, 21 U.S.C. section 360bbb-3(b)(1), unless the authorization is terminated or revoked sooner.    Influenza A by PCR NEGATIVE NEGATIVE Final   Influenza B by PCR NEGATIVE  NEGATIVE Final    Comment: (NOTE) The Xpert Xpress SARS-CoV-2/FLU/RSV assay is intended as an aid in  the diagnosis of influenza from Nasopharyngeal swab specimens and  should not be used as a sole basis for treatment. Nasal washings and  aspirates are unacceptable for Xpert Xpress SARS-CoV-2/FLU/RSV  testing. Fact Sheet for Patients: PinkCheek.be Fact Sheet for Healthcare Providers: GravelBags.it This test is not yet approved or cleared by the [place] FDA and  has been authorized for detection and/or diagnosis of SARS-CoV-2 by  FDA under an Emergency Use Authorization (EUA). This EUA will remain  in effect (meaning this test can be used) for the duration of the  Covid-19 declaration under Section 564(b)(1) of the Act, 21  U.S.C. section 360bbb-3(b)(1), unless the authorization is  terminated or revoked. Performed at [facility], [place] [address], [place], [place] [address]   MRSA PCR Screening     Status: None   Collection Time: 12/07/19  9:31 PM   Specimen: Nasal Mucosa; Nasopharyngeal  Result Value Ref Range Status   MRSA by PCR NEGATIVE NEGATIVE Final    Comment:        The GeneXpert MRSA Assay (FDA approved for NASAL specimens only), is one component of a comprehensive MRSA colonization surveillance program. It is not intended to diagnose MRSA infection nor to guide or monitor treatment for MRSA infections. Performed at [facility], [place] [address] [place], [place] [address]          Radiology Studies: DG Wrist 2 Views Left  Result Date: 12/07/2019 CLINICAL DATA:  Post reduction EXAM: LEFT WRIST - 2 VIEW COMPARISON:  12/06/2018 FINDINGS: Interval casting material limits bone detail. Acute markedly comminuted intra-articular distal radius fracture with residual 1/3 shaft diameter of dorsal displacement of distal fracture fragment, though improved alignment compared  to previous. Decreased angulation and impaction. Acute comminuted distal ulna fracture, also with decreased angulation and impaction. IMPRESSION: Acute comminuted intra-articular distal radius fracture with decreased impaction and displacement. Acute comminuted distal ulna fracture, also with decreased impaction and angulation Electronically Signed   By: Donavan Foil M.D.   On: 12/07/2019 16:44   DG Wrist Complete Left  Result Date: 12/07/2019 CLINICAL DATA:  Fall.  Left wrist pain and swelling.  Deformity. EXAM: LEFT WRIST - COMPLETE 3+ VIEW COMPARISON:  None. FINDINGS: There is a comminuted, impacted and dorsally displaced fracture of the distal radius, with dorsal displacement approximately 1.5 cm. Apparent intra-articular component of the fracture. The dorsal articular surface is also angulated dorsally approximately 20 degrees. There is an associated fracture of the distal  ulna, mildly comminuted, also somewhat impacted and dorsally angulated. The carpus displaces with the distal radial fracture component. There is no dislocation. No other fractures. Skeletal structures are demineralized. There is surrounding soft tissue swelling. IMPRESSION: 1. Comminuted, dorsally displaced and dorsally angulated fracture of the distal left radius with an intra-articular component, associated with a mildly comminuted, dorsally angulated fracture of the distal ulna. No dislocation. Electronically Signed   By: Lajean Manes M.D.   On: 12/07/2019 13:51   Pelvis Portable  Result Date: 12/08/2019 CLINICAL DATA:  Status post hip replacement EXAM: PORTABLE PELVIS 1-2 VIEWS COMPARISON:  12/07/2019 FINDINGS: Interval left hip replacement with normal alignment. Apparent cortical fracture deformity at the inferior aspect of the greater trochanter. Gas in the soft tissues along with cutaneous staples. Possible acute left superior pubic ramus fracture. IMPRESSION: 1. Interval left hip replacement with intact hardware and normal  alignment. Expected postsurgical changes overlying left hip. 2. Cortex deformity at the inferior aspect of the greater trochanter on the left, suspect for fracture. Possible acute left superior pubic ramus fracture as well. Electronically Signed   By: Donavan Foil M.D.   On: 12/08/2019 18:54   DG C-Arm 1-60 Min-No Report  Result Date: 12/08/2019 Fluoroscopy was utilized by the requesting physician.  No radiographic interpretation.   DG HIP OPERATIVE UNILAT WITH PELVIS LEFT  Result Date: 12/08/2019 CLINICAL DATA:  Total left hip replacement. EXAM: OPERATIVE LEFT HIP (WITH PELVIS IF PERFORMED) 2 VIEWS TECHNIQUE: Fluoroscopic spot image(s) were submitted for interpretation post-operatively. COMPARISON:  12/07/2019. FINDINGS: Total left hip replacement. Hardware intact. Anatomic alignment. Pelvic calcifications consistent phleboliths. IMPRESSION: Total left hip replacement with anatomic alignment. Electronically Signed   By: Marcello Moores  Register   On: 12/08/2019 16:13   DG Hip Unilat With Pelvis 2-3 Views Left  Result Date: 12/07/2019 CLINICAL DATA:  Fall.  Left hip pain. EXAM: DG HIP (WITH OR WITHOUT PELVIS) 2-3V LEFT COMPARISON:  None. FINDINGS: Nondisplaced, non comminuted fracture across the mid left femoral neck. Mild valgus angulation. No other fractures. Hip joints, SI joints and symphysis pubis are normally aligned. Skeletal structures are demineralized. Soft tissues are unremarkable. IMPRESSION: 1. Nondisplaced, non comminuted fracture of the left mid femoral neck with mild valgus angulation. No dislocation. Electronically Signed   By: Lajean Manes M.D.   On: 12/07/2019 13:52        Scheduled Meds: . acetaminophen  500 mg Oral Q6H  . allopurinol  300 mg Oral Daily  . aspirin EC  325 mg Oral Q breakfast  . brimonidine  1 drop Left Eye BID  . Chlorhexidine Gluconate Cloth  6 each Topical Daily  . cholecalciferol  1,000 Units Oral Daily  . cycloSPORINE  1 drop Both Eyes BID  . docusate  sodium  100 mg Oral BID  . dorzolamide  1 drop Both Eyes Daily  . fentaNYL  50-100 mcg Intravenous Once  . FLUoxetine  40 mg Oral Daily  . furosemide  40 mg Oral Daily  . latanoprost  1 drop Both Eyes QHS  . magnesium oxide  400 mg Oral BID  . mometasone-formoterol  2 puff Inhalation BID  . mycophenolate  180 mg Oral BID  . pantoprazole  40 mg Oral Daily  . predniSONE  7.5 mg Oral Q breakfast  . tacrolimus  0.5 mg Oral QHS  . tacrolimus  1 mg Oral Daily  . vitamin B-12  100 mcg Oral Daily   Continuous Infusions: . methocarbamol (ROBAXIN) IV  LOS: 2 days    Time spent: 25 mins,More than 50% of that time was spent in counseling and/or coordination of care.      Shelly Coss, MD [facility] Pager [phone]  If 7PM-7AM, please contact night-coverage [email or web address] Password [email or web address] 12/09/2019, 1:39 PM

## 2019-12-09 NOTE — Anesthesia Postprocedure Evaluation (Signed)
Anesthesia Post Note  Patient: April [name]  Procedure(s) Performed: TOTAL HIP ARTHROPLASTY ANTERIOR APPROACH (Left Hip) OPEN REDUCTION INTERNAL FIXATION (ORIF) WRIST FRACTURE, REPAIR LACERATION LEFT FOREARM (Left Wrist)     Patient location during evaluation: PACU Anesthesia Type: General Level of consciousness: awake and alert Pain management: pain level controlled Vital Signs Assessment: post-procedure vital signs reviewed and stable Respiratory status: spontaneous breathing, nonlabored ventilation, respiratory function stable and patient connected to nasal cannula oxygen Cardiovascular status: blood pressure returned to baseline and stable Postop Assessment: no apparent nausea or vomiting Anesthetic complications: no    Last Vitals:  Vitals:   12/09/19 0939 12/09/19 1400  BP: (!) 111/53 (!) 119/58  Pulse: 77 80  Resp: 16 18  Temp: 36.6 C 36.5 C  SpO2: (!) 88% 98%    Last Pain:  Vitals:   12/09/19 1400  TempSrc: Oral  PainSc:    Pain Goal: Patients Stated Pain Goal: 4 (12/09/19 1251)                 Lidia Collum

## 2019-12-10 LAB — BASIC METABOLIC PANEL
Anion gap: 7 (ref 5–15)
BUN: 38 mg/dL — ABNORMAL HIGH (ref 8–23)
CO2: 26 mmol/L (ref 22–32)
Calcium: 8.6 mg/dL — ABNORMAL LOW (ref 8.9–10.3)
Chloride: 100 mmol/L (ref 98–111)
Creatinine, Ser: 1.46 mg/dL — ABNORMAL HIGH (ref 0.44–1.00)
GFR calc Af Amer: 44 mL/min — ABNORMAL LOW (ref >60)
GFR calc non Af Amer: 38 mL/min — ABNORMAL LOW (ref >60)
Glucose, Bld: 150 mg/dL — ABNORMAL HIGH (ref 70–99)
Potassium: 4.4 mmol/L (ref 3.5–5.1)
Sodium: 133 mmol/L — ABNORMAL LOW (ref 135–145)

## 2019-12-10 LAB — CBC WITH DIFFERENTIAL/PLATELET
Abs Immature Granulocytes: 0.17 10*3/uL — ABNORMAL HIGH (ref 0.00–0.07)
Basophils Absolute: 0 10*3/uL (ref 0.0–0.1)
Basophils Relative: 0 %
Eosinophils Absolute: 0 10*3/uL (ref 0.0–0.5)
Eosinophils Relative: 0 %
HCT: 26.1 % — ABNORMAL LOW (ref 36.0–46.0)
Hemoglobin: 8 g/dL — ABNORMAL LOW (ref 12.0–15.0)
Immature Granulocytes: 1 %
Lymphocytes Relative: 7 %
Lymphs Abs: 0.9 10*3/uL (ref 0.7–4.0)
MCH: 29 pg (ref 26.0–34.0)
MCHC: 30.7 g/dL (ref 30.0–36.0)
MCV: 94.6 fL (ref 80.0–100.0)
Monocytes Absolute: 0.6 10*3/uL (ref 0.1–1.0)
Monocytes Relative: 5 %
Neutro Abs: 10.3 10*3/uL — ABNORMAL HIGH (ref 1.7–7.7)
Neutrophils Relative %: 87 %
Platelets: 135 10*3/uL — ABNORMAL LOW (ref 150–400)
RBC: 2.76 MIL/uL — ABNORMAL LOW (ref 3.87–5.11)
RDW: 17.7 % — ABNORMAL HIGH (ref 11.5–15.5)
WBC: 12 10*3/uL — ABNORMAL HIGH (ref 4.0–10.5)
nRBC: 0.3 % — ABNORMAL HIGH (ref 0.0–0.2)

## 2019-12-10 MED ORDER — SODIUM CHLORIDE 0.9 % IV SOLN: INTRAVENOUS | Status: AC

## 2019-12-10 NOTE — Progress Notes (Signed)
Rehab Admissions Coordinator Note:  Per PT recommendation, this patient was screened by Raechel Ache for appropriateness for an Inpatient Acute Rehab Consult.  At this time, we are recommending an Inpatient Rehab consult.  This AC will place consult order in the chart and an [facility] will follow up Monday for further assessment.   Raechel Ache 12/10/2019, 9:37 AM  I can be reached at [phone].

## 2019-12-10 NOTE — Progress Notes (Addendum)
  PATIENT ID: April [name]  MRN: [id number]  DOB/AGE:  [birth year] / 63 y.o.  2 Days Post-Op Procedure(s) (LRB): TOTAL HIP ARTHROPLASTY ANTERIOR APPROACH (Left) OPEN REDUCTION INTERNAL FIXATION (ORIF) WRIST FRACTURE, REPAIR LACERATION LEFT FOREARM (Left)  Subjective: Increased left hip pain this am (no pain med since 6pm)  No c/o chest pain or SOB.  Slight light-headedness.  Tol PO solid/liquid, +flatus PT worked with patient twice y'day, recommends CIR placement  Objective: Vital signs in last 24 hours: Temp:  [97.7 F (36.5 C)-98.2 F (36.8 C)] 97.7 F (36.5 C) (01/17 0459) Pulse Rate:  [77-98] 91 (01/17 0459) Resp:  [16-18] 17 (01/17 0459) BP: (111-141)/(53-64) 113/62 (01/17 0459) SpO2:  [88 %-98 %] 92 % (01/17 0752)  Intake/Output from previous day: 01/16 0701 - 01/17 0700 In: 1273.5 [P.O.:840; I.V.:433.5] Out: 1400 [Urine:1400] Intake/Output this shift: Total I/O In: 120 [P.O.:120] Out: 650 [Urine:650]  Recent Labs    12/07/19 1303 12/08/19 1518 12/08/19 1736 12/09/19 0457 12/10/19 0434  HGB 10.8* 11.9* 10.5* 9.0* 8.0*   Recent Labs    12/09/19 0457 12/10/19 0434  WBC 15.2* 12.0*  RBC 3.09* 2.76*  HCT 29.1* 26.1*  PLT 130* 135*   Recent Labs    12/09/19 0457 12/10/19 0434  NA 134* 133*  K 4.5 4.4  CL 103 100  CO2 23 26  BUN 35* 38*  CREATININE 1.42* 1.46*  GLUCOSE 258* 150*  CALCIUM 8.1* 8.6*   No results for input(s): LABPT, INR in the last 72 hours.  Physical Exam: L UE splint intact.  Intact LT sens R/M/U distribution with intact motor,  subjectively diminished @ IF., but improved from y'day.  Good AAROM of digits L hip dressing mostly clean/dry.  Intact LT sens P/D/1web, f/e toes.  Assessment/Plan: 2 Days Post-Op Procedure(s) (LRB): TOTAL HIP ARTHROPLASTY ANTERIOR APPROACH (Left) OPEN REDUCTION INTERNAL FIXATION (ORIF) WRIST FRACTURE, REPAIR LACERATION LEFT FOREARM (Left)   Acute blood-loss anemia-HCT 8, but HR 91.  Conferred with Dr.  Tawanna Solo, who indicated preference to not transfuse unless HGB at 7.   Advance diet Weight Bearing as Tolerated (WBAT) L LE, but no active abduction exercises Pain--getting 2 norco and robaxin now WBAT L FA with platform, but not on hand/wrist VTE prophylaxis: SCDs Will follow while inpatient.   Can d/c from surgical perspective once clears PT for d/c and determined to be medically stable.  Rayvon Char [address], [place], [place]  [address] Office: [phone] Mobile: [phone]  12/10/2019, 9:31 AM

## 2019-12-10 NOTE — Progress Notes (Signed)
Occupational Therapy Treatment Patient Details Name: RYNN MARKIEWICZ MRN: [id number] DOB: 1955-11-29 Today's Date: 12/10/2019    History of present illness Pt is a 64 year old woman admitted on 12/07/19 after falling in her kitchen and sustaining L wrist and L hip fractures. Underwent ORIF of L wrist and repair of forearm laceration and  L anterior total hip arthroplasty. PMH: arthritis, kidney transplant 2013, asthma.   OT comments  Pt progressing toward established OT goals, updated d/c venue this date to [place] given pt's level of functioning, motivation to participate with therapy and prior level of functioning. She currently requires modA+2 for bed mobility, she sat EOB and completed grooming with minA. She requires modA+2 for simulated toilet transfer demonstrating good adherence of precautions. Pt will continue to benefit from skilled OT services to maximize safety and independence with ADL/IADL and functional mobility. Will continue to follow acutely and progress as tolerated.    Follow Up Recommendations  Supervision/Assistance - 24 hour;CIR    Equipment Recommendations  Other (comment)(defer to next venue)    Recommendations for Other Services      Precautions / Restrictions Precautions Precautions: Fall Required Braces or Orthoses: Sling Restrictions Weight Bearing Restrictions: Yes LUE Weight Bearing: Weight bear through elbow only LLE Weight Bearing: Weight bearing as tolerated Other Position/Activity Restrictions: no L hip active abduction       Mobility Bed Mobility Overal bed mobility: Needs Assistance Bed Mobility: Supine to Sit;Sit to Supine     Supine to sit: Mod assist;+2 for physical assistance Sit to supine: Mod assist;+2 for physical assistance;+2 for safety/equipment   General bed mobility comments: cues for sequence, use of R LE to self assist.  Physical assist to manage LEs and to control trunk  Transfers Overall transfer level: Needs  assistance Equipment used: Left platform walker Transfers: Sit to/from Stand Sit to Stand: +2 physical assistance;Mod assist Stand pivot transfers: +2 physical assistance;Mod assist       General transfer comment: verbal cues for technique, assist to bring wt up and fwd and to balance in standing    Balance Overall balance assessment: Needs assistance Sitting-balance support: Feet supported;No upper extremity supported Sitting balance-Leahy Scale: Good     Standing balance support: Bilateral upper extremity supported Standing balance-Leahy Scale: Poor                             ADL either performed or assessed with clinical judgement   ADL Overall ADL's : Needs assistance/impaired     Grooming: Sitting;Minimal assistance;Oral care;Wash/dry face Grooming Details (indicate cue type and reason): minA for bimanual tasks;completed oral care sitting EOB                 Toilet Transfer: Moderate assistance;+2 for physical assistance;+2 for safety/equipment;RW Toilet Transfer Details (indicate cue type and reason): sit<>stand with lateral side stepping alongside EOB Toileting- Clothing Manipulation and Hygiene: +2 for physical assistance;Total assistance;Sit to/from stand       Functional mobility during ADLs: Rolling walker;+2 for physical assistance;+2 for safety/equipment;Moderate assistance       Vision       Perception     Praxis      Cognition Arousal/Alertness: Awake/alert Behavior During Therapy: WFL for tasks assessed/performed Overall Cognitive Status: Within Functional Limits for tasks assessed  Exercises Exercises: Total Joint Total Joint Exercises Ankle Circles/Pumps: AROM;Both;20 reps;Supine Quad Sets: AROM;Both;10 reps;Supine Heel Slides: AAROM;20 reps;Left;Supine   Shoulder Instructions       General Comments productive cough at end of session     Pertinent Vitals/  Pain       Pain Assessment: 0-10 Pain Score: 5  Pain Location: L hip Pain Descriptors / Indicators: Burning;Grimacing;Guarding Pain Intervention(s): Limited activity within patient's tolerance;Monitored during session  Home Living                                          Prior Functioning/Environment              Frequency  Min 2X/week        Progress Toward Goals  OT Goals(current goals can now be found in the care plan section)  Progress towards OT goals: Progressing toward goals  Acute Rehab OT Goals Patient Stated Goal: to return to PLOF OT Goal Formulation: With patient Time For Goal Achievement: 12/23/19 Potential to Achieve Goals: Good ADL Goals Pt Will Perform Grooming: with supervision;with min assist;standing Pt Will Perform Lower Body Bathing: with min assist;with adaptive equipment;sit to/from stand Pt Will Perform Lower Body Dressing: with min assist;with adaptive equipment;sit to/from stand Pt Will Transfer to Toilet: with min assist;ambulating;bedside commode Pt Will Perform Toileting - Clothing Manipulation and hygiene: with min assist;sit to/from stand Additional ADL Goal #1: Pt will perform bed mobility with min assist in preparation for ADL.  Plan Discharge plan needs to be updated    Co-evaluation      Reason for Co-Treatment: For patient/therapist safety;To address functional/ADL transfers PT goals addressed during session: Mobility/safety with mobility OT goals addressed during session: ADL's and self-care      AM-PAC OT "6 Clicks" Daily Activity     Outcome Measure   Help from another person eating meals?: A Little Help from another person taking care of personal grooming?: A Little Help from another person toileting, which includes using toliet, bedpan, or urinal?: A Lot Help from another person bathing (including washing, rinsing, drying)?: A Lot Help from another person to put on and taking off regular upper body  clothing?: A Little Help from another person to put on and taking off regular lower body clothing?: A Lot 6 Click Score: 15    End of Session Equipment Utilized During Treatment: Gait belt;Rolling walker  OT Visit Diagnosis: Unsteadiness on feet (R26.81);Pain;Muscle weakness (generalized) (M62.81) Pain - Right/Left: Left Pain - part of body: Arm;Hip   Activity Tolerance Patient tolerated treatment well   Patient Left in bed;with call bell/phone within reach;with bed alarm set   Nurse Communication Mobility status        Time: 6045-4098 OT Time Calculation (min): 27 min  Charges: OT General Charges $OT Visit: 1 Visit OT Treatments $Self Care/Home Management : 8-22 mins  Dorinda Hill OTR/L [place] Office: [place] 12/10/2019, 4:25 PM

## 2019-12-10 NOTE — Progress Notes (Signed)
Physical Therapy Treatment Patient Details Name: April [name] MRN: [id number] DOB: 09-17-1956 Today's Date: 12/10/2019    History of Present Illness Pt is a 64 year old woman admitted on 12/07/19 after falling in her kitchen and sustaining L wrist and L hip fractures. Underwent ORIF of L wrist and repair of forearm laceration and  L anterior total hip arthroplasty. PMH: arthritis, kidney transplant 2013, asthma.    PT Comments    Pt continues motivated to progress but continues to require significant assist of 2 for performance of all mobility tasks.   Follow Up Recommendations        Equipment Recommendations       Recommendations for Other Services       Precautions / Restrictions Precautions Precautions: Fall Restrictions Weight Bearing Restrictions: Yes LUE Weight Bearing: Weight bear through elbow only LLE Weight Bearing: Weight bearing as tolerated Other Position/Activity Restrictions: no L hip active abduction    Mobility  Bed Mobility Overal bed mobility: Needs Assistance Bed Mobility: Supine to Sit;Sit to Supine     Supine to sit: Mod assist;+2 for physical assistance        Transfers                    Ambulation/Gait                 Stairs             Wheelchair Mobility    Modified Rankin (Stroke Patients Only)       Balance                                            Cognition Arousal/Alertness: Awake/alert Behavior During Therapy: WFL for tasks assessed/performed Overall Cognitive Status: Within Functional Limits for tasks assessed                                        Exercises      General Comments        Pertinent Vitals/Pain Pain Assessment: 0-10 Pain Score: 5  Pain Location: L hip Pain Descriptors / Indicators: Burning;Grimacing;Guarding Pain Intervention(s): Limited activity within patient's tolerance;Monitored during session;RN gave pain meds during session     Home Living                      Prior Function            PT Goals (current goals can now be found in the care plan section) Acute Rehab PT Goals Patient Stated Goal: to return to PLOF    Frequency           PT Plan      Co-evaluation PT/OT/SLP Co-Evaluation/Treatment: Yes Reason for Co-Treatment: For patient/therapist safety PT goals addressed during session: Mobility/safety with mobility OT goals addressed during session: ADL's and self-care      AM-PAC PT "6 Clicks" Mobility   Outcome Measure                   End of Session               Time:  -     Charges:  [place] Pager [phone] Office [phone]    Ruqayya Ventress 12/10/2019, 4:00 PM

## 2019-12-10 NOTE — Progress Notes (Signed)
Physical Therapy Treatment Patient Details Name: April [name] MRN: [id number] DOB: 1956-02-13 Today's Date: 12/10/2019    History of Present Illness Pt is a 64 year old woman admitted on 12/07/19 after falling in her kitchen and sustaining L wrist and L hip fractures. Underwent ORIF of L wrist and repair of forearm laceration and  L anterior total hip arthroplasty. PMH: arthritis, kidney transplant 2013, asthma.    PT Comments     Pt continues to require assist of 2 for safe performance of all mobility tasks but is very motivated and is progressing slowly but steadily with mobility.  Pt up to EOB sitting and to stand and tolerated ltd ambulation to side-step up side of bed.   Follow Up Recommendations  CIR     Equipment Recommendations  Other (comment)    Recommendations for Other Services OT consult     Precautions / Restrictions Precautions Precautions: Fall Restrictions Weight Bearing Restrictions: Yes LUE Weight Bearing: Weight bear through elbow only LLE Weight Bearing: Weight bearing as tolerated Other Position/Activity Restrictions: no L hip active abduction    Mobility  Bed Mobility Overal bed mobility: Needs Assistance Bed Mobility: Supine to Sit;Sit to Supine     Supine to sit: Mod assist;+2 for physical assistance Sit to supine: Mod assist;+2 for physical assistance;+2 for safety/equipment   General bed mobility comments: cues for sequence, use of R LE to self assist.  Physical assist to manage LEs and to control trunk  Transfers Overall transfer level: Needs assistance Equipment used: Left platform walker Transfers: Sit to/from Stand Sit to Stand: +2 physical assistance;Mod assist Stand pivot transfers: +2 physical assistance;Mod assist       General transfer comment: verbal cues for technique, assist to bring wt up and fwd and to balance in standing  Ambulation/Gait Ambulation/Gait assistance: Min assist;Mod assist;+2 physical assistance;+2  safety/equipment Gait Distance (Feet): 3 Feet Assistive device: Left platform walker Gait Pattern/deviations: Step-to pattern;Decreased step length - right;Decreased step length - left;Shuffle;Trunk flexed     General Gait Details: pt side-stepped up side of bed; physical assist for support/balance and to manage RW   Stairs             Wheelchair Mobility    Modified Rankin (Stroke Patients Only)       Balance Overall balance assessment: Needs assistance Sitting-balance support: Feet supported;No upper extremity supported Sitting balance-Leahy Scale: Good     Standing balance support: Bilateral upper extremity supported Standing balance-Leahy Scale: Poor                              Cognition Arousal/Alertness: Awake/alert Behavior During Therapy: WFL for tasks assessed/performed Overall Cognitive Status: Within Functional Limits for tasks assessed                                        Exercises Total Joint Exercises Ankle Circles/Pumps: AROM;Both;20 reps;Supine Quad Sets: AROM;Both;10 reps;Supine Heel Slides: AAROM;20 reps;Left;Supine    General Comments        Pertinent Vitals/Pain Pain Assessment: 0-10 Pain Score: 5  Pain Location: L hip Pain Descriptors / Indicators: Burning;Grimacing;Guarding Pain Intervention(s): Limited activity within patient's tolerance;Monitored during session;RN gave pain meds during session    Home Living                      Prior  Function            PT Goals (current goals can now be found in the care plan section) Acute Rehab PT Goals Patient Stated Goal: to return to PLOF PT Goal Formulation: With patient Time For Goal Achievement: 12/23/19 Potential to Achieve Goals: Fair Progress towards PT goals: Progressing toward goals    Frequency    Min 4X/week      PT Plan Current plan remains appropriate    Co-evaluation PT/OT/SLP Co-Evaluation/Treatment: Yes Reason for  Co-Treatment: For patient/therapist safety PT goals addressed during session: Mobility/safety with mobility OT goals addressed during session: ADL's and self-care      AM-PAC PT "6 Clicks" Mobility   Outcome Measure  Help needed turning from your back to your side while in a flat bed without using bedrails?: A Lot Help needed moving from lying on your back to sitting on the side of a flat bed without using bedrails?: A Lot Help needed moving to and from a bed to a chair (including a wheelchair)?: A Lot Help needed standing up from a chair using your arms (e.g., wheelchair or bedside chair)?: A Lot Help needed to walk in hospital room?: A Lot Help needed climbing 3-5 steps with a railing? : Total 6 Click Score: 11    End of Session Equipment Utilized During Treatment: Gait belt Activity Tolerance: Patient limited by fatigue;Patient tolerated treatment well Patient left: in bed;with call bell/phone within reach;with bed alarm set Nurse Communication: Mobility status PT Visit Diagnosis: Difficulty in walking, not elsewhere classified (R26.2);Muscle weakness (generalized) (M62.81)     Time: 4562-5638 PT Time Calculation (min) (ACUTE ONLY): 40 min  Charges:  $Gait Training: 8-22 mins $Therapeutic Exercise: 8-22 mins                     [place] Pager [phone] Office [phone]    April [name] 12/10/2019, 4:07 PM

## 2019-12-10 NOTE — Progress Notes (Addendum)
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  X-ray imaging showed nondisplaced, noncomminuted fracture of the left mid femoral neck with mid to valgus angulation, comminuted fracture of the distal left radius.  Orthopedics consulted.  Status post left total hip arthroplasty and ORIF of left wrist fracture, repair of laceration of left forearm by orthopedics on 12/08/2019.  PT/OT evaluation done and recommended inpatient rehab.    She is medically stable for discharge to inpatient rehab as soon as bed is available.  Assessment & Plan:   Principal Problem:   Closed left hip fracture (HCC) Active Problems:   End stage renal disease (HCC)   Asthma, mild intermittent   GERD (gastroesophageal reflux disease)   Renal transplant recipient   Hyperlipidemia   Chronic diastolic CHF (congestive heart failure) (HCC)   Fracture   Left wrist fracture   Closed left hip fracture after mechanical fall: Orthopedics following.  X-ray finding as above.  Status post left total hip arthroplasty  by orthopedics on 12/08/2019.  Started on aspirin for DVT prophylaxis .pending PT/OT evaluation. Plan for inpatient rehab on discharge  Left wrist fracture: Currently in sling.  S/P  ORIF of left wrist fracture, repair of laceration of left forearm by orthopedics on 12/08/2019.  Acute blood loss anemia: Secondary to surgery.  Hemoglobin in the range of 8 today.  Check CBC tomorrow.  Transfuse if hemoglobin drops below 7.  Leukocytosis: Elevated WC count today.  No fever.  Chest x-ray did not show pneumonia.  Most likely associated with atelectasis.  She also had mild hypoxia on room air and requiring 2 L of oxygen per minute.   Continue incentive spirometer  AKI: Creatinine trended up to 1.4.she appears mildly dehydrated.  Continue gentle IV fluids for 12 hours.  Check BMP tomorrow  Polycystic kidney disease, end-stage renal disease status post renal transplant: On prednisone, Prograf, tacrolimus, mycophenolate, Lasix at home.  Follows with Dr. Jimmy Footman  Mild intermittent asthma: Currently stable.  Not wheezing.  On inhalers at home.  GERD: Continue PPI  Chronic diastolic CHF: Currently euvolemic.  On Lasix at home.lasix on hold due to AKI         DVT prophylaxis:SCD, aspirin Code Status: Full code Family Communication: Discussed with husband on phone on 12/10/19 Disposition Plan: CIR as soon as bed is available  Consultants: Orthopedics  Procedures: None  Antimicrobials:  Anti-infectives (From admission, onward)   Start     Dose/Rate Route Frequency Ordered Stop   12/08/19 2200  ceFAZolin (ANCEF) IVPB 2g/100 mL premix     2 g 200 mL/hr over 30 Minutes Intravenous Every 6 hours 12/08/19 2053 12/09/19 0559   12/08/19 0600  ceFAZolin (ANCEF) IVPB 2g/100 mL premix     2 g 200 mL/hr over 30 Minutes Intravenous On call to O.R. 12/07/19 2132 12/08/19 1412      Subjective:  Patient seen and examined at the bedside this morning.  Hemodynamically stable.  Still on 2 L of oxygen per minute.  Desaturated to 88% on room air.  Appears weak.  Denies any shortness of breath.  Complains of pain on the surgical wound on the left hip  Objective: Vitals:   12/09/19 2137 12/10/19 0135 12/10/19 0459 12/10/19 0752  BP: 140/64 [name])  141/60 113/62   Pulse: 98 94 91   Resp: 17 16 17    Temp: 98.2 F (36.8 C) 98.1 F (36.7 C) 97.7 F (36.5 C)   TempSrc:      SpO2: 98% 95% 97% 92%  Weight:      Height:        Intake/Output Summary (Last 24 hours) at 12/10/2019 1109 Last data filed at 12/10/2019 4098 Gross per 24 hour  Intake 720 ml  Output 2050 ml  Net -1330 ml   Filed Weights   12/07/19 1304  Weight:  79.4 kg    Examination:   General exam: Generalized weakness  HEENT:PERRL,Oral mucosa dry, Ear/Nose normal on gross exam Respiratory system: Decreased air entry on the bases Cardiovascular system: S1 & S2 heard, RRR. No JVD, murmurs, rubs, gallops or clicks. Gastrointestinal system: Abdomen is nondistended, soft and nontender. No organomegaly or masses felt. Normal bowel sounds heard. Central nervous system: Alert and oriented. No focal neurological deficits. Extremities: No edema, no clubbing ,no cyanosis, sling on the left arm, clean surgical wound on the left hip  skin: No rashes, lesions or ulcers,no icterus ,no pallor ent and insight appear normal. Mood & affect appropriate.      Data Reviewed: I have personally reviewed following labs and imaging studies  CBC: Recent Labs  Lab 12/07/19 1303 12/08/19 1518 12/08/19 1736 12/09/19 0457 12/10/19 0434  WBC 8.7  --   --  15.2* 12.0*  NEUTROABS 7.6  --   --  13.8* 10.3*  HGB 10.8* 11.9* 10.5* 9.0* 8.0*  HCT 38.0 35.0* 31.0* 29.1* 26.1*  MCV 96.2  --   --  94.2 94.6  PLT 172  --   --  130* 119*   Basic Metabolic Panel: Recent Labs  Lab 12/07/19 1303 12/08/19 1518 12/08/19 1736 12/09/19 0457 12/10/19 0434  NA 140 137 138 134* 133*  K 4.3 4.8 4.9 4.5 4.4  CL 107  --  104 103 100  CO2 25  --   --  23 26  GLUCOSE 113*  --  225* 258* 150*  BUN 39*  --  28* 35* 38*  CREATININE 1.02*  --  1.20* 1.42* 1.46*  CALCIUM 9.6  --   --  8.1* 8.6*   GFR: Estimated Creatinine Clearance: 38.1 mL/min (A) (by C-G formula based on SCr of 1.46 mg/dL (H)). Liver Function Tests: No results for input(s): AST, ALT, ALKPHOS, BILITOT, PROT, ALBUMIN in the last 168 hours. No results for input(s): LIPASE, AMYLASE in the last 168 hours. No results for input(s): AMMONIA in the last 168 hours. Coagulation Profile: No results for input(s): INR, PROTIME in the last 168 hours. Cardiac Enzymes: No results for input(s): CKTOTAL, CKMB, CKMBINDEX,  TROPONINI in the last 168 hours. BNP (last 3 results) No results for input(s): PROBNP in the last 8760 hours. HbA1C: No results for input(s): HGBA1C in the last 72 hours. CBG: No results for input(s): GLUCAP in the last 168 hours. Lipid Profile: No results for input(s): CHOL, HDL, LDLCALC, TRIG, CHOLHDL, LDLDIRECT in the last 72 hours. Thyroid Function Tests: No results for input(s): TSH, T4TOTAL, FREET4, T3FREE, THYROIDAB in the last 72 hours. Anemia Panel: No results for input(s): VITAMINB12, FOLATE, FERRITIN, TIBC, IRON, RETICCTPCT in the last 72 hours. Sepsis Labs: No results for input(s): PROCALCITON, LATICACIDVEN in the last 168 hours.  Recent Results (from the past 240 hour(s))  Respiratory Panel by RT PCR (Flu A&B, Covid) - Nasopharyngeal Swab     Status: None  Collection Time: 12/07/19  4:33 PM   Specimen: Nasopharyngeal Swab  Result Value Ref Range Status   SARS Coronavirus 2 by RT PCR NEGATIVE NEGATIVE Final    Comment: (NOTE) SARS-CoV-2 target nucleic acids are NOT DETECTED. The SARS-CoV-2 RNA is generally detectable in upper respiratoy specimens during the acute phase of infection. The lowest concentration of SARS-CoV-2 viral copies this assay can detect is 131 copies/mL. A negative result does not preclude SARS-Cov-2 infection and should not be used as the sole basis for treatment or other patient management decisions. A negative result may occur with  improper specimen collection/handling, submission of specimen other than nasopharyngeal swab, presence of viral mutation(s) within the areas targeted by this assay, and inadequate number of viral copies (<131 copies/mL). A negative result must be combined with clinical observations, patient history, and epidemiological information. The expected result is Negative. Fact Sheet for Patients:  PinkCheek.be Fact Sheet for Healthcare Providers:  GravelBags.it This  test is not yet ap proved or cleared by the [place] FDA and  has been authorized for detection and/or diagnosis of SARS-CoV-2 by FDA under an Emergency Use Authorization (EUA). This EUA will remain  in effect (meaning this test can be used) for the duration of the COVID-19 declaration under Section 564(b)(1) of the Act, 21 U.S.C. section 360bbb-3(b)(1), unless the authorization is terminated or revoked sooner.    Influenza A by PCR NEGATIVE NEGATIVE Final   Influenza B by PCR NEGATIVE NEGATIVE Final    Comment: (NOTE) The Xpert Xpress SARS-CoV-2/FLU/RSV assay is intended as an aid in  the diagnosis of influenza from Nasopharyngeal swab specimens and  should not be used as a sole basis for treatment. Nasal washings and  aspirates are unacceptable for Xpert Xpress SARS-CoV-2/FLU/RSV  testing. Fact Sheet for Patients: PinkCheek.be Fact Sheet for Healthcare Providers: GravelBags.it This test is not yet approved or cleared by the [place] FDA and  has been authorized for detection and/or diagnosis of SARS-CoV-2 by  FDA under an Emergency Use Authorization (EUA). This EUA will remain  in effect (meaning this test can be used) for the duration of the  Covid-19 declaration under Section 564(b)(1) of the Act, 21  U.S.C. section 360bbb-3(b)(1), unless the authorization is  terminated or revoked. Performed at [facility], [place] [address], [place], [place] [address]   MRSA PCR Screening     Status: None   Collection Time: 12/07/19  9:31 PM   Specimen: Nasal Mucosa; Nasopharyngeal  Result Value Ref Range Status   MRSA by PCR NEGATIVE NEGATIVE Final    Comment:        The GeneXpert MRSA Assay (FDA approved for NASAL specimens only), is one component of a comprehensive MRSA colonization surveillance program. It is not intended to diagnose MRSA infection nor to guide or monitor treatment for MRSA  infections. Performed at [facility], [place] [address] [place], [place] [address]          Radiology Studies: DG Chest 1 View  Result Date: 12/09/2019 CLINICAL DATA:  64 year old female with history of leukocytosis. EXAM: CHEST  1 VIEW COMPARISON:  Chest x-ray 06/29/2019. FINDINGS: Elevation of the left hemidiaphragm. Chronic linear scarring in the left lung base, similar to prior studies. No acute consolidative airspace disease. No pleural effusions. No evidence of pulmonary edema. Mild cardiomegaly. Upper mediastinal contours are within normal limits. IMPRESSION: 1. Low lung volumes without radiographic evidence of acute cardiopulmonary disease. Electronically Signed   By: Mauri Brooklyn.D.  On: 12/09/2019 14:31   Pelvis Portable  Result Date: 12/08/2019 CLINICAL DATA:  Status post hip replacement EXAM: PORTABLE PELVIS 1-2 VIEWS COMPARISON:  12/07/2019 FINDINGS: Interval left hip replacement with normal alignment. Apparent cortical fracture deformity at the inferior aspect of the greater trochanter. Gas in the soft tissues along with cutaneous staples. Possible acute left superior pubic ramus fracture. IMPRESSION: 1. Interval left hip replacement with intact hardware and normal alignment. Expected postsurgical changes overlying left hip. 2. Cortex deformity at the inferior aspect of the greater trochanter on the left, suspect for fracture. Possible acute left superior pubic ramus fracture as well. Electronically Signed   By: Donavan Foil M.D.   On: 12/08/2019 18:54   DG C-Arm 1-60 Min-No Report  Result Date: 12/08/2019 Fluoroscopy was utilized by the requesting physician.  No radiographic interpretation.   DG HIP OPERATIVE UNILAT WITH PELVIS LEFT  Result Date: 12/08/2019 CLINICAL DATA:  Total left hip replacement. EXAM: OPERATIVE LEFT HIP (WITH PELVIS IF PERFORMED) 2 VIEWS TECHNIQUE: Fluoroscopic spot image(s) were submitted for interpretation post-operatively.  COMPARISON:  12/07/2019. FINDINGS: Total left hip replacement. Hardware intact. Anatomic alignment. Pelvic calcifications consistent phleboliths. IMPRESSION: Total left hip replacement with anatomic alignment. Electronically Signed   By: [place]   On: 12/08/2019 16:13        Scheduled Meds: . allopurinol  300 mg Oral Daily  . aspirin EC  325 mg Oral Q breakfast  . brimonidine  1 drop Left Eye BID  . Chlorhexidine Gluconate Cloth  6 each Topical Daily  . cholecalciferol  1,000 Units Oral Daily  . cycloSPORINE  1 drop Both Eyes BID  . docusate sodium  100 mg Oral BID  . dorzolamide  1 drop Both Eyes Daily  . fentaNYL  50-100 mcg Intravenous Once  . FLUoxetine  40 mg Oral Daily  . latanoprost  1 drop Both Eyes QHS  . magnesium oxide  400 mg Oral BID  . mometasone-formoterol  2 puff Inhalation BID  . mycophenolate  180 mg Oral BID  . pantoprazole  40 mg Oral Daily  . predniSONE  7.5 mg Oral Q breakfast  . tacrolimus  0.5 mg Oral QHS  . tacrolimus  1 mg Oral Daily  . vitamin B-12  100 mcg Oral Daily   Continuous Infusions: . sodium chloride    . methocarbamol (ROBAXIN) IV       LOS: 3 days    Time spent: 25 mins,More than 50% of that time was spent in counseling and/or coordination of care.      Shelly Coss, MD [facility] Pager [phone]  If 7PM-7AM, please contact night-coverage [email or web address] Password [facility] 12/10/2019, 11:09 AM

## 2019-12-11 ENCOUNTER — Encounter: Payer: Self-pay | Admitting: *Deleted

## 2019-12-11 LAB — CBC WITH DIFFERENTIAL/PLATELET
Abs Immature Granulocytes: 0.13 10*3/uL — ABNORMAL HIGH (ref 0.00–0.07)
Basophils Absolute: 0 10*3/uL (ref 0.0–0.1)
Basophils Relative: 0 %
Eosinophils Absolute: 0.1 10*3/uL (ref 0.0–0.5)
Eosinophils Relative: 1 %
HCT: 24.7 % — ABNORMAL LOW (ref 36.0–46.0)
Hemoglobin: 7.6 g/dL — ABNORMAL LOW (ref 12.0–15.0)
Immature Granulocytes: 1 %
Lymphocytes Relative: 9 %
Lymphs Abs: 0.8 10*3/uL (ref 0.7–4.0)
MCH: 29.9 pg (ref 26.0–34.0)
MCHC: 30.8 g/dL (ref 30.0–36.0)
MCV: 97.2 fL (ref 80.0–100.0)
Monocytes Absolute: 0.5 10*3/uL (ref 0.1–1.0)
Monocytes Relative: 5 %
Neutro Abs: 7.8 10*3/uL — ABNORMAL HIGH (ref 1.7–7.7)
Neutrophils Relative %: 84 %
Platelets: 109 10*3/uL — ABNORMAL LOW (ref 150–400)
RBC: 2.54 MIL/uL — ABNORMAL LOW (ref 3.87–5.11)
RDW: 17.9 % — ABNORMAL HIGH (ref 11.5–15.5)
WBC: 9.3 10*3/uL (ref 4.0–10.5)
nRBC: 0.2 % (ref 0.0–0.2)

## 2019-12-11 LAB — BASIC METABOLIC PANEL
Anion gap: 4 — ABNORMAL LOW (ref 5–15)
BUN: 29 mg/dL — ABNORMAL HIGH (ref 8–23)
CO2: 28 mmol/L (ref 22–32)
Calcium: 8.6 mg/dL — ABNORMAL LOW (ref 8.9–10.3)
Chloride: 106 mmol/L (ref 98–111)
Creatinine, Ser: 1.31 mg/dL — ABNORMAL HIGH (ref 0.44–1.00)
GFR calc Af Amer: 50 mL/min — ABNORMAL LOW (ref >60)
GFR calc non Af Amer: 44 mL/min — ABNORMAL LOW (ref >60)
Glucose, Bld: 153 mg/dL — ABNORMAL HIGH (ref 70–99)
Potassium: 4.7 mmol/L (ref 3.5–5.1)
Sodium: 138 mmol/L (ref 135–145)

## 2019-12-11 LAB — PREPARE RBC (CROSSMATCH)

## 2019-12-11 MED ORDER — SODIUM CHLORIDE 0.9 % IV SOLN: INTRAVENOUS | Status: DC

## 2019-12-11 MED ORDER — SODIUM CHLORIDE 0.9% IV SOLUTION: Freq: Once | INTRAVENOUS | Status: AC

## 2019-12-11 MED ORDER — HYDROCODONE-ACETAMINOPHEN 5-325 MG PO TABS
1.0000 | ORAL_TABLET | ORAL | Status: DC | PRN
  Administered 2019-12-12 (×2): 1 via ORAL
  Administered 2019-12-13 – 2019-12-15 (×8): 2 via ORAL
  Filled 2019-12-11: qty 1
  Filled 2019-12-11 (×9): qty 2

## 2019-12-11 MED ORDER — FUROSEMIDE 10 MG/ML IJ SOLN
10.0000 mg | Freq: Once | INTRAMUSCULAR | Status: AC
  Administered 2019-12-11: 10 mg via INTRAVENOUS
  Filled 2019-12-11: qty 2

## 2019-12-11 NOTE — Progress Notes (Signed)
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  X-ray imaging showed nondisplaced, noncomminuted fracture of the left mid femoral neck with mid to valgus angulation, comminuted fracture of the distal left radius.  Orthopedics consulted.  Status post left total hip arthroplasty and ORIF of left wrist fracture, repair of laceration of left forearm by orthopedics on 12/08/2019.  PT/OT evaluation done and recommended inpatient rehab.    She is medically stable for discharge to inpatient rehab as soon as bed is available.  Assessment & Plan:   Principal Problem:   Closed left hip fracture (HCC) Active Problems:   End stage renal disease (HCC)   Asthma, mild intermittent   GERD (gastroesophageal reflux disease)   Renal transplant recipient   Hyperlipidemia   Chronic diastolic CHF (congestive heart failure) (HCC)   Fracture   Left wrist fracture   Closed left hip fracture after mechanical fall: Orthopedics following.  X-ray finding as above.  Status post left total hip arthroplasty  by orthopedics on 12/08/2019.  Started on aspirin for DVT prophylaxis .pending PT/OT evaluation. Plan for inpatient rehab on discharge  Left wrist fracture: Currently in sling.  S/P  ORIF of left wrist fracture, repair of laceration of left forearm by orthopedics on 12/08/2019.  Acute blood loss anemia: Secondary to surgery.  Hemoglobin in the range of 7 today.  Being transfused with 2 units of PRBC today.Check CBC tomorrow  Leukocytosis: Improved.  Most likely associated with atelectasis.  She also had mild hypoxia on room air and requiring 2 L of oxygen per minute.  Continue incentive spirometer  AKI: Creatinine trended down to  1.3.she appears mildly dehydrated.  Hopefully blood transfusion will help.  Check BMP tomorrow  Polycystic kidney disease, end-stage renal disease status post renal transplant: On prednisone, Prograf, tacrolimus, mycophenolate, Lasix at home.  Follows with Dr. Jimmy Footman  Mild intermittent asthma: Currently stable.  Not wheezing.  On inhalers at home.  GERD: Continue PPI  Chronic diastolic CHF: Currently euvolemic.  On Lasix at home.lasix on hold due to AKI         DVT prophylaxis:SCD, aspirin Code Status: Full code Family Communication: Discussed with husband on phone on 12/10/19 Disposition Plan: CIR as soon as bed is available  Consultants: Orthopedics  Procedures: None  Antimicrobials:  Anti-infectives (From admission, onward)   Start     Dose/Rate Route Frequency Ordered Stop   12/08/19 2200  ceFAZolin (ANCEF) IVPB 2g/100 mL premix     2 g 200 mL/hr over 30 Minutes Intravenous Every 6 hours 12/08/19 2053 12/09/19 0559   12/08/19 0600  ceFAZolin (ANCEF) IVPB 2g/100 mL premix     2 g 200 mL/hr over 30 Minutes Intravenous On call to O.R. 12/07/19 2132 12/08/19 1412      Subjective:  Patient seen and examined at the bedside this morning.  Hemodynamically stable.  Looks more comfortable than yesterday but is still very weak.  Pain better than yesterday.  Objective: Vitals:   12/10/19 1952 12/10/19 2202 12/11/19 0535 12/11/19 0824  BP:  (!) 143/57 (!) 138/58   Pulse:  100 94   Resp:  15 20   Temp:  98.3 F (36.8 C) 97.7 F (36.5 C)   TempSrc:  Oral Oral  SpO2: 96% 98%  98%  Weight:      Height:        Intake/Output Summary (Last 24 hours) at 12/11/2019 1146 Last data filed at 12/11/2019 5170 Gross per 24 hour  Intake 498.08 ml  Output 1400 ml  Net -901.92 ml   Filed Weights   12/07/19 1304  Weight: 79.4 kg    Examination:   General exam: Generalized weakness, obese HEENT:PERRL,Oral mucosa dry, Ear/Nose normal on gross exam Respiratory system:  Decreased air entry on the bases Cardiovascular system: S1 & S2 heard, RRR. No JVD, murmurs, rubs, gallops or clicks. Gastrointestinal system: Abdomen is nondistended, soft and nontender. No organomegaly or masses felt. Normal bowel sounds heard. Central nervous system: Alert and oriented. No focal neurological deficits. Extremities: No edema, no clubbing ,no cyanosis, sling on the left arm, clean surgical wound on the left hip  skin: No rashes, lesions or ulcers,no icterus ,no pallor    Data Reviewed: I have personally reviewed following labs and imaging studies  CBC: Recent Labs  Lab 12/07/19 1303 12/07/19 1303 12/08/19 1518 12/08/19 1736 12/09/19 0457 12/10/19 0434 12/11/19 0345  WBC 8.7  --   --   --  15.2* 12.0* 9.3  NEUTROABS 7.6  --   --   --  13.8* 10.3* 7.8*  HGB 10.8*   < > 11.9* 10.5* 9.0* 8.0* 7.6*  HCT 38.0   < > 35.0* 31.0* 29.1* 26.1* 24.7*  MCV 96.2  --   --   --  94.2 94.6 97.2  PLT 172  --   --   --  130* 135* 109*   < > = values in this interval not displayed.   Basic Metabolic Panel: Recent Labs  Lab 12/07/19 1303 12/07/19 1303 12/08/19 1518 12/08/19 1736 12/09/19 0457 12/10/19 0434 12/11/19 0345  NA 140   < > 137 138 134* 133* 138  K 4.3   < > 4.8 4.9 4.5 4.4 4.7  CL 107  --   --  104 103 100 106  CO2 25  --   --   --  23 26 28   GLUCOSE 113*  --   --  225* 258* 150* 153*  BUN 39*  --   --  28* 35* 38* 29*  CREATININE 1.02*  --   --  1.20* 1.42* 1.46* 1.31*  CALCIUM 9.6  --   --   --  8.1* 8.6* 8.6*   < > = values in this interval not displayed.   GFR: Estimated Creatinine Clearance: 42.5 mL/min (A) (by C-G formula based on SCr of 1.31 mg/dL (H)). Liver Function Tests: No results for input(s): AST, ALT, ALKPHOS, BILITOT, PROT, ALBUMIN in the last 168 hours. No results for input(s): LIPASE, AMYLASE in the last 168 hours. No results for input(s): AMMONIA in the last 168 hours. Coagulation Profile: No results for input(s): INR, PROTIME in the  last 168 hours. Cardiac Enzymes: No results for input(s): CKTOTAL, CKMB, CKMBINDEX, TROPONINI in the last 168 hours. BNP (last 3 results) No results for input(s): PROBNP in the last 8760 hours. HbA1C: No results for input(s): HGBA1C in the last 72 hours. CBG: No results for input(s): GLUCAP in the last 168 hours. Lipid Profile: No results for input(s): CHOL, HDL, LDLCALC, TRIG, CHOLHDL, LDLDIRECT in the last 72 hours. Thyroid Function Tests: No results for input(s): TSH, T4TOTAL, FREET4, T3FREE, THYROIDAB in the last 72 hours. Anemia Panel: No results for input(s): VITAMINB12, FOLATE, FERRITIN, TIBC, IRON, RETICCTPCT in the  last 72 hours. Sepsis Labs: No results for input(s): PROCALCITON, LATICACIDVEN in the last 168 hours.  Recent Results (from the past 240 hour(s))  Respiratory Panel by RT PCR (Flu A&B, Covid) - Nasopharyngeal Swab     Status: None   Collection Time: 12/07/19  4:33 PM   Specimen: Nasopharyngeal Swab  Result Value Ref Range Status   SARS Coronavirus 2 by RT PCR NEGATIVE NEGATIVE Final    Comment: (NOTE) SARS-CoV-2 target nucleic acids are NOT DETECTED. The SARS-CoV-2 RNA is generally detectable in upper respiratoy specimens during the acute phase of infection. The lowest concentration of SARS-CoV-2 viral copies this assay can detect is 131 copies/mL. A negative result does not preclude SARS-Cov-2 infection and should not be used as the sole basis for treatment or other patient management decisions. A negative result may occur with  improper specimen collection/handling, submission of specimen other than nasopharyngeal swab, presence of viral mutation(s) within the areas targeted by this assay, and inadequate number of viral copies (<131 copies/mL). A negative result must be combined with clinical observations, patient history, and epidemiological information. The expected result is Negative. Fact Sheet for Patients:   PinkCheek.be Fact Sheet for Healthcare Providers:  GravelBags.it This test is not yet ap proved or cleared by the [place] FDA and  has been authorized for detection and/or diagnosis of SARS-CoV-2 by FDA under an Emergency Use Authorization (EUA). This EUA will remain  in effect (meaning this test can be used) for the duration of the COVID-19 declaration under Section 564(b)(1) of the Act, 21 U.S.C. section 360bbb-3(b)(1), unless the authorization is terminated or revoked sooner.    Influenza A by PCR NEGATIVE NEGATIVE Final   Influenza B by PCR NEGATIVE NEGATIVE Final    Comment: (NOTE) The Xpert Xpress SARS-CoV-2/FLU/RSV assay is intended as an aid in  the diagnosis of influenza from Nasopharyngeal swab specimens and  should not be used as a sole basis for treatment. Nasal washings and  aspirates are unacceptable for Xpert Xpress SARS-CoV-2/FLU/RSV  testing. Fact Sheet for Patients: PinkCheek.be Fact Sheet for Healthcare Providers: GravelBags.it This test is not yet approved or cleared by the [place] FDA and  has been authorized for detection and/or diagnosis of SARS-CoV-2 by  FDA under an Emergency Use Authorization (EUA). This EUA will remain  in effect (meaning this test can be used) for the duration of the  Covid-19 declaration under Section 564(b)(1) of the Act, 21  U.S.C. section 360bbb-3(b)(1), unless the authorization is  terminated or revoked. Performed at [facility], [place] [address] [place], [place] [address]   MRSA PCR Screening     Status: None   Collection Time: 12/07/19  9:31 PM   Specimen: Nasal Mucosa; Nasopharyngeal  Result Value Ref Range Status   MRSA by PCR NEGATIVE NEGATIVE Final    Comment:        The GeneXpert MRSA Assay (FDA approved for NASAL specimens only), is one component of a comprehensive MRSA  colonization surveillance program. It is not intended to diagnose MRSA infection nor to guide or monitor treatment for MRSA infections. Performed at [facility], [place] [address] [place], [place] [address]          Radiology Studies: DG Chest 1 View  Result Date: 12/09/2019 CLINICAL DATA:  64 year old female with history of leukocytosis. EXAM: CHEST  1 VIEW COMPARISON:  Chest x-ray 06/29/2019. FINDINGS: Elevation of the left hemidiaphragm. Chronic linear scarring in the left lung base, similar to prior  studies. No acute consolidative airspace disease. No pleural effusions. No evidence of pulmonary edema. Mild cardiomegaly. Upper mediastinal contours are within normal limits. IMPRESSION: 1. Low lung volumes without radiographic evidence of acute cardiopulmonary disease. Electronically Signed   By: Vinnie Langton M.D.   On: 12/09/2019 14:31        Scheduled Meds: . sodium chloride   Intravenous Once  . allopurinol  300 mg Oral Daily  . aspirin EC  325 mg Oral Q breakfast  . brimonidine  1 drop Left Eye BID  . Chlorhexidine Gluconate Cloth  6 each Topical Daily  . cholecalciferol  1,000 Units Oral Daily  . cycloSPORINE  1 drop Both Eyes BID  . docusate sodium  100 mg Oral BID  . dorzolamide  1 drop Both Eyes Daily  . fentaNYL  50-100 mcg Intravenous Once  . FLUoxetine  40 mg Oral Daily  . latanoprost  1 drop Both Eyes QHS  . magnesium oxide  400 mg Oral BID  . mometasone-formoterol  2 puff Inhalation BID  . mycophenolate  180 mg Oral BID  . pantoprazole  40 mg Oral Daily  . predniSONE  7.5 mg Oral Q breakfast  . tacrolimus  0.5 mg Oral QHS  . tacrolimus  1 mg Oral Daily  . vitamin B-12  100 mcg Oral Daily   Continuous Infusions: . methocarbamol (ROBAXIN) IV       LOS: 4 days    Time spent: 25 mins,More than 50% of that time was spent in counseling and/or coordination of care.      Shelly Coss, MD [facility] Pager  [phone]  If 7PM-7AM, please contact night-coverage [email or web address] Password [facility] 12/11/2019, 11:46 AM

## 2019-12-11 NOTE — Progress Notes (Signed)
Inpatient Rehabilitation-Admissions Coordinator   Spoke with pt via phone for rehab assessment. Discussed recommended program, anticipated LOS, expected assistance needed at DC, and participation requirements. Pt interested in the program and would like to pursue. I confirmed DC support from pt's spouse. I will begin insurance authorization process for possible admit.   Will update once I hear back form insurance regarding decision for CIR.   Please call if questions.   Raechel Ache, OTR/L  Rehab Admissions Coordinator  [phone] 12/11/2019 3:05 PM

## 2019-12-11 NOTE — Progress Notes (Signed)
Subjective: 3 Days Post-Op Procedure(s) (LRB): TOTAL HIP ARTHROPLASTY ANTERIOR APPROACH (Left) OPEN REDUCTION INTERNAL FIXATION (ORIF) WRIST FRACTURE, REPAIR LACERATION LEFT FOREARM (Left) Patient reports pain as moderate.  The patient reports that she was dizzy when she got up with physical therapy yesterday and also feels very weak and tired.  Objective: Vital signs in last 24 hours: Temp:  [97.7 F (36.5 C)-98.6 F (37 C)] 97.7 F (36.5 C) (01/18 0535) Pulse Rate:  [94-100] 94 (01/18 0535) Resp:  [15-20] 20 (01/18 0535) BP: (132-143)/(57-58) 138/58 (01/18 0535) SpO2:  [96 %-99 %] 98 % (01/17 2202)  Intake/Output from previous day: 01/17 0701 - 01/18 0700 In: 498.1 [P.O.:440; I.V.:58.1] Out: 1450 [Urine:1450] Intake/Output this shift: No intake/output data recorded.  Recent Labs    12/08/19 1518 12/08/19 1736 12/09/19 0457 12/10/19 0434 12/11/19 0345  HGB 11.9* 10.5* 9.0* 8.0* 7.6*   Recent Labs    12/10/19 0434 12/11/19 0345  WBC 12.0* 9.3  RBC 2.76* 2.54*  HCT 26.1* 24.7*  PLT 135* 109*   Recent Labs    12/10/19 0434 12/11/19 0345  NA 133* 138  K 4.4 4.7  CL 100 106  CO2 26 28  BUN 38* 29*  CREATININE 1.46* 1.31*  GLUCOSE 150* 153*  CALCIUM 8.6* 8.6*   No results for input(s): LABPT, INR in the last 72 hours. Left hip exam: Her left hip dressing is benign.  No significant drainage.  She does have moderate thigh swelling as would be expected.  Calf is soft and nontender.  N/V is intact to the left lower extremity. Left upper extremity exam: She is in a sugar tong splint.  She is able to move her fingers actively but does have some swelling in them.  I loosened her splint and this improved this.  Good sensation in the fingers.    Assessment/Plan: 3 Days Post-Op Procedure(s) (LRB): TOTAL HIP ARTHROPLASTY ANTERIOR APPROACH (Left) OPEN REDUCTION INTERNAL FIXATION (ORIF) WRIST FRACTURE, REPAIR LACERATION LEFT FOREARM (Left)  Symptomatic postop blood loss  anemia.  She has significant blood loss intraoperatively. History of kidney transplant.  Kidney function is okay at this point. Plan: After lengthy discussion with the patient and after consulting with Dr.Graves we will plan on transfusing 2 units of packed RBCs today and give 10 mg of Lasix IV between units. This should increase her rehab potential and allow adequate perfusion of her kidney.  She is symptomatic. She will continue physical therapy/Occupational Therapy weightbearing as tolerated on left lower extremity avoiding active abduction.  She can weight-bear on the left upper extremity forearm but try to keep the weight off of her wrist. Agree with CIR consult.  Hopefully we can get her over there soon. Continue aspirin 325 mg 1 daily with food along with SCDs for DVT prophylaxis. Can go to rehab at any point after blood is transfused if medically stable.   Erlene Senters 12/11/2019, 8:18 AM

## 2019-12-11 NOTE — Transfer of Care (Signed)
Immediate Anesthesia Transfer of Care Note  Patient: April [name]  Procedure(s) Performed: TOTAL HIP ARTHROPLASTY ANTERIOR APPROACH (Left Hip) OPEN REDUCTION INTERNAL FIXATION (ORIF) WRIST FRACTURE, REPAIR LACERATION LEFT FOREARM (Left Wrist)  Patient Location: PACU  Anesthesia Type:General  Level of Consciousness: awake, drowsy and patient cooperative  Airway & Oxygen Therapy: Patient Spontanous Breathing and Patient connected to face mask oxygen  Post-op Assessment: Report given to RN, Post -op Vital signs reviewed and stable and Patient moving all extremities X 4  Post vital signs: stable  Last Vitals:  Vitals Value Taken Time  BP 138/58 12/11/19 0535  Temp 36.5 C 12/11/19 0535  Pulse 94 12/11/19 0535  Resp 20 12/11/19 0535  SpO2 98 % 12/10/19 2202    Last Pain:  Vitals:   12/11/19 0535  TempSrc: Oral  PainSc:       Patients Stated Pain Goal: 4 (15/72/62 0355)  Complications: No apparent anesthesia complications

## 2019-12-12 LAB — BASIC METABOLIC PANEL
Anion gap: 5 (ref 5–15)
BUN: 21 mg/dL (ref 8–23)
CO2: 29 mmol/L (ref 22–32)
Calcium: 8.7 mg/dL — ABNORMAL LOW (ref 8.9–10.3)
Chloride: 102 mmol/L (ref 98–111)
Creatinine, Ser: 0.91 mg/dL (ref 0.44–1.00)
GFR calc Af Amer: >60 mL/min (ref >60)
GFR calc non Af Amer: >60 mL/min (ref >60)
Glucose, Bld: 108 mg/dL — ABNORMAL HIGH (ref 70–99)
Potassium: 4.6 mmol/L (ref 3.5–5.1)
Sodium: 136 mmol/L (ref 135–145)

## 2019-12-12 LAB — BPAM RBC
Blood Product Expiration Date: 202101222359
Blood Product Expiration Date: 202102032359
Blood Product Expiration Date: 202102082359
Blood Product Expiration Date: 202102082359
Blood Product Expiration Date: 202102082359
ISSUE DATE / TIME: 202101151555
ISSUE DATE / TIME: 202101151555
ISSUE DATE / TIME: 202101151650
ISSUE DATE / TIME: 202101181508
ISSUE DATE / TIME: 202101182203
Unit Type and Rh: 600
Unit Type and Rh: 600
Unit Type and Rh: 600
Unit Type and Rh: 600
Unit Type and Rh: 600

## 2019-12-12 LAB — CBC WITH DIFFERENTIAL/PLATELET
Abs Immature Granulocytes: 0.14 10*3/uL — ABNORMAL HIGH (ref 0.00–0.07)
Basophils Absolute: 0 10*3/uL (ref 0.0–0.1)
Basophils Relative: 0 %
Eosinophils Absolute: 0.1 10*3/uL (ref 0.0–0.5)
Eosinophils Relative: 1 %
HCT: 29.8 % — ABNORMAL LOW (ref 36.0–46.0)
Hemoglobin: 9.3 g/dL — ABNORMAL LOW (ref 12.0–15.0)
Immature Granulocytes: 2 %
Lymphocytes Relative: 10 %
Lymphs Abs: 0.9 10*3/uL (ref 0.7–4.0)
MCH: 29.2 pg (ref 26.0–34.0)
MCHC: 31.2 g/dL (ref 30.0–36.0)
MCV: 93.7 fL (ref 80.0–100.0)
Monocytes Absolute: 0.5 10*3/uL (ref 0.1–1.0)
Monocytes Relative: 6 %
Neutro Abs: 6.8 10*3/uL (ref 1.7–7.7)
Neutrophils Relative %: 81 %
Platelets: 110 10*3/uL — ABNORMAL LOW (ref 150–400)
RBC: 3.18 MIL/uL — ABNORMAL LOW (ref 3.87–5.11)
RDW: 17.5 % — ABNORMAL HIGH (ref 11.5–15.5)
WBC: 8.4 10*3/uL (ref 4.0–10.5)
nRBC: 0 % (ref 0.0–0.2)

## 2019-12-12 LAB — TYPE AND SCREEN
ABO/RH(D): A NEG
Antibody Screen: NEGATIVE
Unit division: 0
Unit division: 0
Unit division: 0
Unit division: 0
Unit division: 0

## 2019-12-12 NOTE — Progress Notes (Signed)
Inpatient Rehabilitation-Admissions Coordinator   I have insurance approval for admit to CIR. However, I do not have a bed available today for this patient. I will follow up tomorrow for possible admit, pending medical clearance from attending service and bed availability here in rehab. Pt is aware of insurance approval and that I will call her tomorrow if we have a bed opening for her.   Please call if questions.   Raechel Ache, OTR/L  Rehab Admissions Coordinator  [phone] 12/12/2019 4:00 PM

## 2019-12-12 NOTE — Progress Notes (Signed)
Subjective: 4 Days Post-Op Procedure(s) (LRB): TOTAL HIP ARTHROPLASTY ANTERIOR APPROACH (Left) OPEN REDUCTION INTERNAL FIXATION (ORIF) WRIST FRACTURE, REPAIR LACERATION LEFT FOREARM (Left) Patient reports pain as moderate.  The patient reports that she feels better since getting the blood transfusion yesterday.  She is still making slow progress with physical therapy.  She does have some left hip pain.  Objective: Vital signs in last 24 hours: Temp:  [97.7 F (36.5 C)-99.1 F (37.3 C)] 97.7 F (36.5 C) (01/19 0443) Pulse Rate:  [83-95] 83 (01/19 0443) Resp:  [10-22] 16 (01/19 0443) BP: (123-152)/(53-67) 152/66 (01/19 0443) SpO2:  [98 %-100 %] 98 % (01/19 0821)  Intake/Output from previous day: 01/18 0701 - 01/19 0700 In: 2400.5 [P.O.:1140; I.V.:292.5; Blood:968] Out: 2600 [Urine:2600] Intake/Output this shift: Total I/O In: 240 [P.O.:240] Out: -   Recent Labs    12/10/19 0434 12/11/19 0345 12/12/19 0413  HGB 8.0* 7.6* 9.3*   Recent Labs    12/11/19 0345 12/12/19 0413  WBC 9.3 8.4  RBC 2.54* 3.18*  HCT 24.7* 29.8*  PLT 109* 110*   Recent Labs    12/11/19 0345 12/12/19 0823  NA 138 136  K 4.7 4.6  CL 106 102  CO2 28 29  BUN 29* 21  CREATININE 1.31* 0.91  GLUCOSE 153* 108*  CALCIUM 8.6* 8.7*   No results for input(s): LABPT, INR in the last 72 hours.  The patient is seen and is in bed now. Left hip exam: Her left hip dressing is benign.  Minimal pain with range of motion of the left hip.  Her left calf is soft and nontender. Left upper extremity exam: Her sugar tong splint is intact.  She is able to move her fingers with some mild swelling in them.   Assessment/Plan: 4 Days Post-Op Procedure(s) (LRB): TOTAL HIP ARTHROPLASTY ANTERIOR APPROACH (Left) OPEN REDUCTION INTERNAL FIXATION (ORIF) WRIST FRACTURE, REPAIR LACERATION LEFT FOREARM (Left)  Postop blood loss anemia, improved after transfusion. Plan: Continue 325 mg aspirin daily for DVT prophylaxis  along with SCDs. Encouraged ambulation/mobility. She is making slow progress with physical therapy and continue to feel that she would potentially be a good candidate for CIR if appropriate arrangements can be made. Weight-bear as tolerated on left lower extremity and left forearm avoiding putting significant weight on her left wrist. She will need to follow-up in the office in 2 weeks.  Erlene Senters 12/12/2019, 12:49 PM

## 2019-12-12 NOTE — Care Management Important Message (Signed)
Important Message  Patient Details IM Letter given to [facility] Case Manager to present to the Patient Name: MADYSUN THALL MRN: [id number] Date of Birth: 1956/10/15   Medicare Important Message Given:  Yes     Kerin Salen 12/12/2019, 2:22 PM

## 2019-12-12 NOTE — Progress Notes (Signed)
PROGRESS NOTE    April [name]  XBM:[id number] DOB: [birth year] DOA: 12/07/2019 PCP: [name], MD   Brief Narrative:  Patient is a 64 year old female with history of arthritis, GERD, polycystic kidney disease, ESRD, post renal transplant in thousand 17, asthma, hyperlipidemia who presented with mechanical fall from home.  X-ray imaging showed nondisplaced, noncomminuted fracture of the left mid femoral neck with mid to valgus angulation, comminuted fracture of the distal left radius.  Orthopedics consulted.  Status post left total hip arthroplasty and ORIF of left wrist fracture, repair of laceration of left forearm by orthopedics on 12/08/2019.  PT/OT evaluation done and recommended inpatient rehab.    She is medically stable for discharge to inpatient rehab as soon as bed is available.  Assessment & Plan:   Principal Problem:   Closed left hip fracture (HCC) Active Problems:   End stage renal disease (HCC)   Asthma, mild intermittent   GERD (gastroesophageal reflux disease)   Renal transplant recipient   Hyperlipidemia   Chronic diastolic CHF (congestive heart failure) (HCC)   Fracture   Left wrist fracture   Closed left hip fracture after mechanical fall: Orthopedics following.  X-ray finding as above.  Status post left total hip arthroplasty  by orthopedics on 12/08/2019.  Started on aspirin for DVT prophylaxis .pending PT/OT evaluation. Plan for inpatient rehab on discharge  Left wrist fracture: Currently in sling.  S/P  ORIF of left wrist fracture, repair of laceration of left forearm by orthopedics on 12/08/2019.  Acute blood loss anemia: Secondary to surgery.  Status post 2 units of blood transfusion on 12/11/19.  Hemoglobin in the range of 9 .  Leukocytosis: Improved.  Most likely associated with atelectasis.  She also had mild hypoxia on room air and requiring 2 L of oxygen per minute.  Continue incentive spirometer  AKI: Resolved.  Kidney function at  baseline.  Polycystic kidney disease, end-stage renal disease status post renal transplant: On prednisone, Prograf, tacrolimus, mycophenolate, Lasix at home.  Follows with Dr. Jimmy Footman  Mild intermittent asthma: Currently stable.  Not wheezing.  On inhalers at home.  GERD: Continue PPI  Chronic diastolic CHF: Currently euvolemic.  On Lasix at home.lasix on hold due to AKI.  Resume on discharge.        DVT prophylaxis:SCD, aspirin Code Status: Full code Family Communication: Discussed with husband on phone on 12/10/19 Disposition Plan: CIR as soon as bed is available  Consultants: Orthopedics  Procedures: None  Antimicrobials:  Anti-infectives (From admission, onward)   Start     Dose/Rate Route Frequency Ordered Stop   12/08/19 2200  ceFAZolin (ANCEF) IVPB 2g/100 mL premix     2 g 200 mL/hr over 30 Minutes Intravenous Every 6 hours 12/08/19 2053 12/09/19 0559   12/08/19 0600  ceFAZolin (ANCEF) IVPB 2g/100 mL premix     2 g 200 mL/hr over 30 Minutes Intravenous On call to O.R. 12/07/19 2132 12/08/19 1412      Subjective:  Patient seen and examined the bedside this morning.  Hemodynamically stable.  Comfortable.  No active issues.  Waiting for bed and CIR.  Objective: Vitals:   12/11/19 2229 12/12/19 0037 12/12/19 0443 12/12/19 0821  BP: (!) 143/62 129/64 (!) 152/66   Pulse: 95 83 83   Resp: 18 18 16    Temp: 99 F (37.2 C) 98 F (36.7 C) 97.7 F (36.5 C)   TempSrc: Oral Oral Oral   SpO2: 99% 100% 99% 98%  Weight:  Height:        Intake/Output Summary (Last 24 hours) at 12/12/2019 1231 Last data filed at 12/12/2019 1000 Gross per 24 hour  Intake 2520.47 ml  Output 2000 ml  Net 520.47 ml   Filed Weights   12/07/19 1304  Weight: 79.4 kg    Examination:  General exam: Generalized weakness, obese  Respiratory system: No wheezes or crackles Cardiovascular system: S1 & S2 heard, RRR. No JVD, murmurs, rubs, gallops or clicks. Gastrointestinal system:  Abdomen is nondistended, soft and nontender. No organomegaly or masses felt. Normal bowel sounds heard. Central nervous system: Alert and oriented. No focal neurological deficits. Extremities: No edema, no clubbing ,no cyanosis, sling on the left arm, clean surgical wound on the left hip  skin: No rashes, lesions or ulcers,no icterus ,no pallor    Data Reviewed: I have personally reviewed following labs and imaging studies  CBC: Recent Labs  Lab 12/07/19 1303 12/08/19 1518 12/08/19 1736 12/09/19 0457 12/10/19 0434 12/11/19 0345 12/12/19 0413  WBC 8.7  --   --  15.2* 12.0* 9.3 8.4  NEUTROABS 7.6  --   --  13.8* 10.3* 7.8* 6.8  HGB 10.8*   < > 10.5* 9.0* 8.0* 7.6* 9.3*  HCT 38.0   < > 31.0* 29.1* 26.1* 24.7* 29.8*  MCV 96.2  --   --  94.2 94.6 97.2 93.7  PLT 172  --   --  130* 135* 109* 110*   < > = values in this interval not displayed.   Basic Metabolic Panel: Recent Labs  Lab 12/07/19 1303 12/08/19 1518 12/08/19 1736 12/09/19 0457 12/10/19 0434 12/11/19 0345 12/12/19 0823  NA 140   < > 138 134* 133* 138 136  K 4.3   < > 4.9 4.5 4.4 4.7 4.6  CL 107  --  104 103 100 106 102  CO2 25  --   --  23 26 28 29   GLUCOSE 113*  --  225* 258* 150* 153* 108*  BUN 39*  --  28* 35* 38* 29* 21  CREATININE 1.02*  --  1.20* 1.42* 1.46* 1.31* 0.91  CALCIUM 9.6  --   --  8.1* 8.6* 8.6* 8.7*   < > = values in this interval not displayed.   GFR: Estimated Creatinine Clearance: 61.1 mL/min (by C-G formula based on SCr of 0.91 mg/dL). Liver Function Tests: No results for input(s): AST, ALT, ALKPHOS, BILITOT, PROT, ALBUMIN in the last 168 hours. No results for input(s): LIPASE, AMYLASE in the last 168 hours. No results for input(s): AMMONIA in the last 168 hours. Coagulation Profile: No results for input(s): INR, PROTIME in the last 168 hours. Cardiac Enzymes: No results for input(s): CKTOTAL, CKMB, CKMBINDEX, TROPONINI in the last 168 hours. BNP (last 3 results) No results for  input(s): PROBNP in the last 8760 hours. HbA1C: No results for input(s): HGBA1C in the last 72 hours. CBG: No results for input(s): GLUCAP in the last 168 hours. Lipid Profile: No results for input(s): CHOL, HDL, LDLCALC, TRIG, CHOLHDL, LDLDIRECT in the last 72 hours. Thyroid Function Tests: No results for input(s): TSH, T4TOTAL, FREET4, T3FREE, THYROIDAB in the last 72 hours. Anemia Panel: No results for input(s): VITAMINB12, FOLATE, FERRITIN, TIBC, IRON, RETICCTPCT in the last 72 hours. Sepsis Labs: No results for input(s): PROCALCITON, LATICACIDVEN in the last 168 hours.  Recent Results (from the past 240 hour(s))  Respiratory Panel by RT PCR (Flu A&B, Covid) - Nasopharyngeal Swab     Status: None  Collection Time: 12/07/19  4:33 PM   Specimen: Nasopharyngeal Swab  Result Value Ref Range Status   SARS Coronavirus 2 by RT PCR NEGATIVE NEGATIVE Final    Comment: (NOTE) SARS-CoV-2 target nucleic acids are NOT DETECTED. The SARS-CoV-2 RNA is generally detectable in upper respiratoy specimens during the acute phase of infection. The lowest concentration of SARS-CoV-2 viral copies this assay can detect is 131 copies/mL. A negative result does not preclude SARS-Cov-2 infection and should not be used as the sole basis for treatment or other patient management decisions. A negative result may occur with  improper specimen collection/handling, submission of specimen other than nasopharyngeal swab, presence of viral mutation(s) within the areas targeted by this assay, and inadequate number of viral copies (<131 copies/mL). A negative result must be combined with clinical observations, patient history, and epidemiological information. The expected result is Negative. Fact Sheet for Patients:  PinkCheek.be Fact Sheet for Healthcare Providers:  GravelBags.it This test is not yet ap proved or cleared by the [place] FDA and   has been authorized for detection and/or diagnosis of SARS-CoV-2 by FDA under an Emergency Use Authorization (EUA). This EUA will remain  in effect (meaning this test can be used) for the duration of the COVID-19 declaration under Section 564(b)(1) of the Act, 21 U.S.C. section 360bbb-3(b)(1), unless the authorization is terminated or revoked sooner.    Influenza A by PCR NEGATIVE NEGATIVE Final   Influenza B by PCR NEGATIVE NEGATIVE Final    Comment: (NOTE) The Xpert Xpress SARS-CoV-2/FLU/RSV assay is intended as an aid in  the diagnosis of influenza from Nasopharyngeal swab specimens and  should not be used as a sole basis for treatment. Nasal washings and  aspirates are unacceptable for Xpert Xpress SARS-CoV-2/FLU/RSV  testing. Fact Sheet for Patients: PinkCheek.be Fact Sheet for Healthcare Providers: GravelBags.it This test is not yet approved or cleared by the [place] FDA and  has been authorized for detection and/or diagnosis of SARS-CoV-2 by  FDA under an Emergency Use Authorization (EUA). This EUA will remain  in effect (meaning this test can be used) for the duration of the  Covid-19 declaration under Section 564(b)(1) of the Act, 21  U.S.C. section 360bbb-3(b)(1), unless the authorization is  terminated or revoked. Performed at [facility], [place] [address], [place], [place] [address]   MRSA PCR Screening     Status: None   Collection Time: 12/07/19  9:31 PM   Specimen: Nasal Mucosa; Nasopharyngeal  Result Value Ref Range Status   MRSA by PCR NEGATIVE NEGATIVE Final    Comment:        The GeneXpert MRSA Assay (FDA approved for NASAL specimens only), is one component of a comprehensive MRSA colonization surveillance program. It is not intended to diagnose MRSA infection nor to guide or monitor treatment for MRSA infections. Performed at [facility], [place]  [address] [place], [place] [address]          Radiology Studies: No results found.      Scheduled Meds: . allopurinol  300 mg Oral Daily  . aspirin EC  325 mg Oral Q breakfast  . brimonidine  1 drop Left Eye BID  . Chlorhexidine Gluconate Cloth  6 each Topical Daily  . cholecalciferol  1,000 Units Oral Daily  . cycloSPORINE  1 drop Both Eyes BID  . docusate sodium  100 mg Oral BID  . dorzolamide  1 drop Both Eyes Daily  . FLUoxetine  40 mg Oral  Daily  . latanoprost  1 drop Both Eyes QHS  . mometasone-formoterol  2 puff Inhalation BID  . mycophenolate  180 mg Oral BID  . pantoprazole  40 mg Oral Daily  . predniSONE  7.5 mg Oral Q breakfast  . tacrolimus  0.5 mg Oral QHS  . tacrolimus  1 mg Oral Daily  . vitamin B-12  100 mcg Oral Daily   Continuous Infusions: . methocarbamol (ROBAXIN) IV       LOS: 5 days    Time spent: 25 mins,More than 50% of that time was spent in counseling and/or coordination of care.      Shelly Coss, MD [facility] Pager [phone]  If 7PM-7AM, please contact night-coverage [email or web address] Password [facility] 12/12/2019, 12:31 PM

## 2019-12-12 NOTE — Progress Notes (Signed)
Physical Therapy Treatment Patient Details Name: April [name] MRN: [id number] DOB: 1956/11/19 Today's Date: 12/12/2019    History of Present Illness Pt is a 64 year old woman admitted on 12/07/19 after falling in her kitchen and sustaining L wrist and L hip fractures. Underwent ORIF of L wrist and repair of forearm laceration and  L anterior total hip arthroplasty. PMH: arthritis, kidney transplant 2013, asthma.    PT Comments    POD # 4 Assisted OOB to [facility] and back to bed.  General bed mobility comments: cues for sequence, use of R LE to self assist.  Physical assist to manage LEs and to control trunk.  General transfer comment: assisted OOB to [facility] 1/4 pivot towards pt R with increased assist to complete turn.  Assisted with sit to stand usinmg platform walker with 50% VC's on proper hand placement and backward gait to bed. General Gait Details: a few backward and side steps from [facility] to [facility] with 50% VC's on proper sequencing and safety with turn completion.  Performed some L LE TE's of AP, knee presses, AAROM HS and glut squeezes followed by ICE.   Follow Up Recommendations  CIR     Equipment Recommendations       Recommendations for Other Services       Precautions / Restrictions Precautions Precautions: Fall Required Braces or Orthoses: Sling Restrictions Weight Bearing Restrictions: Yes LUE Weight Bearing: Weight bear through elbow only LLE Weight Bearing: Weight bearing as tolerated Other Position/Activity Restrictions: no L hip active abduction    Mobility  Bed Mobility Overal bed mobility: Needs Assistance Bed Mobility: Supine to Sit;Sit to Supine     Supine to sit: Mod assist;+2 for physical assistance Sit to supine: Mod assist;+2 for physical assistance;+2 for safety/equipment;Max assist   General bed mobility comments: cues for sequence, use of R LE to self assist.  Physical assist to manage LEs and to control trunk  Transfers Overall transfer level: Needs  assistance Equipment used: Left platform walker;None Transfers: Sit to/from Stand;Stand Pivot Transfers Sit to Stand: +2 physical assistance;Mod assist;Max assist;+2 safety/equipment Stand pivot transfers: Max assist;+2 physical assistance;+2 safety/equipment       General transfer comment: assisted OOB to [facility] 1/4 pivot towards pt R with increased assist to complete turn.  Assisted with sit to stand usinmg platform walker with 50% VC's on proper hand placement and backward gait to bed.  Ambulation/Gait Ambulation/Gait assistance: Mod assist;+2 physical assistance;+2 safety/equipment Gait Distance (Feet): 1 Feet Assistive device: Left platform walker Gait Pattern/deviations: Step-to pattern;Decreased step length - right;Decreased step length - left;Shuffle;Trunk flexed Gait velocity: decreased   General Gait Details: a few backward and side steps from [facility] to [facility] with 50% VC's on proper sequencing and safety with turn completion.   Stairs             Wheelchair Mobility    Modified Rankin (Stroke Patients Only)       Balance                                            Cognition Arousal/Alertness: Awake/alert Behavior During Therapy: WFL for tasks assessed/performed Overall Cognitive Status: Within Functional Limits for tasks assessed  Exercises      General Comments        Pertinent Vitals/Pain Pain Assessment: 0-10 Pain Score: 7  Pain Location: L hip Pain Descriptors / Indicators: Burning;Grimacing;Guarding;Operative site guarding;Discomfort Pain Intervention(s): Monitored during session;Premedicated before session;Repositioned;Ice applied    Home Living                      Prior Function            PT Goals (current goals can now be found in the care plan section) Progress towards PT goals: Progressing toward goals    Frequency    Min 4X/week      PT Plan  Current plan remains appropriate    Co-evaluation              AM-PAC PT "6 Clicks" Mobility   Outcome Measure  Help needed turning from your back to your side while in a flat bed without using bedrails?: A Lot Help needed moving from lying on your back to sitting on the side of a flat bed without using bedrails?: A Lot Help needed moving to and from a bed to a chair (including a wheelchair)?: A Lot Help needed standing up from a chair using your arms (e.g., wheelchair or bedside chair)?: A Lot Help needed to walk in hospital room?: A Lot Help needed climbing 3-5 steps with a railing? : Total 6 Click Score: 11    End of Session Equipment Utilized During Treatment: Gait belt Activity Tolerance: Patient limited by fatigue;Patient tolerated treatment well Patient left: in bed;with call bell/phone within reach;with bed alarm set Nurse Communication: Mobility status PT Visit Diagnosis: Difficulty in walking, not elsewhere classified (R26.2);Muscle weakness (generalized) (M62.81)     Time: 5329-9242 PT Time Calculation (min) (ACUTE ONLY): 26 min  Charges:  $Therapeutic Activity: 23-37 mins                     Rica Koyanagi  PTA Acute  Rehabilitation Services Pager      [phone] Office      [phone]

## 2019-12-12 NOTE — PMR Pre-admission (Signed)
PMR Admission Coordinator Pre-Admission Assessment  Patient: April [name] is an 64 y.o., female MRN: [id number] DOB: September 01, 1956 Height: _0  (154.9 cm) Weight: 79.4 kg  Insurance Information HMO:     PPO: yes     PCP:      IPA:      80/20:      OTHER:  PRIMARY: U[employer] Medicare      Policy#: [id number]      Subscriber: Patient CM Name: Andee Poles      Phone#: [phone]     Fax#: [phone] Pre-Cert#: [id number]      Employer:  [employer]auf[employer] provided by Andee Poles from Bernadene Bell for admit to CIR. Pt is approved for 7 days with initial start date of 1/19 with updates due 1/25. Follow up clinicals are to (f): [phone] with heading "continued stay." Benefits:  Phone #: online     Name: [email or web address] Eff. Date: 11/24/19 - present     Deduct: $200 ($0 met)      Out of Pocket Max: $2,200 (includes deductible - $0 met)      Life Max:  CIR: after $200 deductible is met, then 100% coverage, 0% co-insurance      SNF: after $200 deductible met, then 100% coverage, 0% co-insurance; limited to 100 days/cal yr  Outpatient: after $200 deductible met, then 100% coverage, 0% co-insurance; limited by medical necessity      Home Health: after $200 deductible met, then 100% coverage, 0% co-insurance; limited by medical necessity (no more than 8 hrs/d and 35 hrs/wk)      DME: after $200 deductible met, then 100% coverage, 0% co-insurance    Providers:  SECONDARY: None       Policy#:       Subscriber:  CM Name:       Phone#:      Fax#:  Pre-Cert#:       Employer:  Benefits:  Phone #:     Name: Eff. Date:      Deduct:      Out of Pocket Max:       Life Max: CIR:       SNF: Outpatient:      Co-Pay:  Home Health:       Co-Pay:  DME:      Co-Pay:   Medicaid Application Date:       Case Manager:  Disability Application Date:       Case Worker:   The "Data Collection Information Summary" for patients in Inpatient Rehabilitation Facilities with attached "Privacy Act [place] Records" was provided and  verbally reviewed with: Patient  Emergency Contact Information Contact Information    Name Relation Home Work Mobile   Scholze,Jacob Spouse [phone]      Current Medical History  Patient Admitting Diagnosis: multi-ortho   History of Present Illness: April [name] is a 64 year old female with history of polycystic kidney disease, chronic diastolic congestive heart failure, end-stage renal disease post renal transplant 06/02/2012 with baseline creatinine 1.02-1.29 maintained on Prograf, chronic prednisone as well as Myfortic followed by renal service Dr. Jimmy Footman, asthma, remote tobacco abuse 21 years ago, hyperlipidemia. Presented 12/07/2019 after mechanical fall in her kitchen landing on her left side without loss of consciousness.  X-rays and imaging revealed left side displaced femoral neck fracture as well as severely comminuted distal radius fracture on the left.  Underwent left total hip replacement anterior approach as well as ORIF left distal radius fracture 12/08/2019 per Dr. Berenice Primas as well as repair of forearm  laceration.  Hospital course pain management.  Weightbearing as tolerated through elbow only of left upper extremity weightbearing as tolerated left lower extremity.  No left hip abduction.  Acute blood loss anemia status post 2 units blood transfusion 12/11/2019 with latest hemoglobin 9.8.  Maintained on aspirin 325 mg daily for DVT prophylaxis.  Her renal function remained stable with latest creatinine 0.91.  Therapy evaluations completed and patient is to be admitted for a comprehensive rehab program on 12/15/19    Patient's medical record from [facility] has been reviewed by the rehabilitation admission coordinator and physician.  Past Medical History  Past Medical History:  Diagnosis Date  . Arthritis   . Asthma   . Depression   . GERD (gastroesophageal reflux disease)   . Hyperlipidemia   . Hyperparathyroidism   . Polycystic  kidney   . PONV (postoperative nausea and vomiting)     Family History   family history includes Heart disease in her father; Hypertension in her mother; Polycystic kidney disease in her brother and father.  Prior Rehab/Hospitalizations Has the patient had prior rehab or hospitalizations prior to admission? Yes  Has the patient had major surgery during 100 days prior to admission? Yes   Current Medications  Current Facility-Administered Medications:  .  acetaminophen (TYLENOL) tablet 325-650 mg, 325-650 mg, Oral, Q6H PRN, Gary Fleet, PA-C, 650 mg at 12/13/19 0105 .  albuterol (VENTOLIN HFA) 108 (90 Base) MCG/ACT inhaler 2 puff, 2 puff, Inhalation, Q4H PRN, Gary Fleet, PA-C .  allopurinol (ZYLOPRIM) tablet 300 mg, 300 mg, Oral, Daily, Gary Fleet, PA-C, 300 mg at 12/15/19 0807 .  alum & mag hydroxide-simeth (MAALOX/MYLANTA) 200-200-20 MG/5ML suspension 30 mL, 30 mL, Oral, Q4H PRN, Gary Fleet, PA-C .  aspirin EC tablet 325 mg, 325 mg, Oral, Q breakfast, Gary Fleet, PA-C, 325 mg at 12/15/19 0805 .  brimonidine (ALPHAGAN) 0.15 % ophthalmic solution 1 drop, 1 drop, Left Eye, BID, Gary Fleet, PA-C, 1 drop at 12/15/19 0817 .  Chlorhexidine Gluconate Cloth 2 % PADS 6 each, 6 each, Topical, Daily, Shelly Coss, MD, 6 each at 12/12/19 1044 .  cholecalciferol (VITAMIN D3) tablet 1,000 Units, 1,000 Units, Oral, Daily, Gary Fleet, PA-C, 1,000 Units at 12/15/19 0806 .  cycloSPORINE (RESTASIS) 0.05 % ophthalmic emulsion 1 drop, 1 drop, Both Eyes, BID, Gary Fleet, PA-C, 1 drop at 12/15/19 0810 .  docusate sodium (COLACE) capsule 100 mg, 100 mg, Oral, BID, Gary Fleet, PA-C, 100 mg at 12/15/19 0805 .  dorzolamide (TRUSOPT) 2 % ophthalmic solution 1 drop, 1 drop, Both Eyes, Daily, Gary Fleet, PA-C, 1 drop at 12/15/19 0811 .  FLUoxetine (PROZAC) capsule 40 mg, 40 mg, Oral, Daily, Gary Fleet, PA-C, 40 mg at 12/15/19 4098 .  HYDROcodone-acetaminophen (NORCO/VICODIN)  5-325 MG per tablet 1-2 tablet, 1-2 tablet, Oral, Q4H PRN, Gary Fleet, PA-C, 2 tablet at 12/15/19 0804 .  latanoprost (XALATAN) 0.005 % ophthalmic solution 1 drop, 1 drop, Both Eyes, QHS, Bethune, Jeneen Rinks, PA-C, 1 drop at 12/14/19 2143 .  methocarbamol (ROBAXIN) tablet 500 mg, 500 mg, Oral, Q6H PRN, 500 mg at 12/14/19 2142 **OR** methocarbamol (ROBAXIN) 500 mg in dextrose 5 % 50 mL IVPB, 500 mg, Intravenous, Q6H PRN, Bethune, James, PA-C .  mometasone-formoterol (DULERA) 200-5 MCG/ACT inhaler 2 puff, 2 puff, Inhalation, BID, Gary Fleet, PA-C, 2 puff at 12/15/19 [phone] .  morphine 2 MG/ML injection 0.5-1 mg, 0.5-1 mg, Intravenous, Q2H PRN, Gary Fleet, PA-C, 1 mg at 12/08/19 2310 .  mycophenolate (MYFORTIC) EC tablet 180  mg, 180 mg, Oral, BID, Gary Fleet, PA-C, 180 mg at 12/15/19 2330 .  ondansetron (ZOFRAN) tablet 4 mg, 4 mg, Oral, Q6H PRN **OR** ondansetron (ZOFRAN) injection 4 mg, 4 mg, Intravenous, Q6H PRN, Bethune, James, PA-C .  pantoprazole (PROTONIX) EC tablet 40 mg, 40 mg, Oral, Daily, Gary Fleet, PA-C, 40 mg at 12/15/19 0806 .  polyethylene glycol (MIRALAX / GLYCOLAX) packet 17 g, 17 g, Oral, Daily PRN, Gary Fleet, PA-C, 17 g at 12/13/19 2152 .  predniSONE (DELTASONE) tablet 7.5 mg, 7.5 mg, Oral, Q breakfast, Gary Fleet, PA-C, 7.5 mg at 12/15/19 0806 .  tacrolimus (PROGRAF) capsule 0.5 mg, 0.5 mg, Oral, QHS, Gary Fleet, PA-C, 0.5 mg at 12/14/19 2144 .  tacrolimus (PROGRAF) capsule 1 mg, 1 mg, Oral, Daily, Gary Fleet, PA-C, 1 mg at 12/15/19 0762 .  vitamin B-12 (CYANOCOBALAMIN) tablet 100 mcg, 100 mcg, Oral, Daily, Gary Fleet, PA-C, 100 mcg at 12/15/19 2633  Patients Current Diet:  Diet Order            Diet - low sodium heart healthy        Diet regular Room service appropriate? Yes; Fluid consistency: Thin  Diet effective now              Precautions / Restrictions Precautions Precautions: Fall Restrictions Weight Bearing Restrictions:  Yes LUE Weight Bearing: Weight bear through elbow only LLE Weight Bearing: Weight bearing as tolerated Other Position/Activity Restrictions: no L hip active abduction   Has the patient had 2 or more falls or a fall with injury in the past year? Yes  Prior Activity Level Limited Community (1-2x/wk): not working or driving since last fall, recent kidney transplant so trying to avoid out of house activities as well  Prior Functional Level Self Care: Did the patient need help bathing, dressing, using the toilet or eating? Independent  Indoor Mobility: Did the patient need assistance with walking from room to room (with or without device)? Independent  Stairs: Did the patient need assistance with internal or external stairs (with or without device)? Needed some help  Functional Cognition: Did the patient need help planning regular tasks such as shopping or remembering to take medications? [place] / Equipment Home Assistive Devices/Equipment: Bedside commode/3-in-1, Eyeglasses, Shower chair with back, Environmental consultant (specify type), Nebulizer(4 wheeled walker) Home Equipment: Environmental consultant - 2 wheels, Wheelchair - manual, Bedside commode, Shower seat, Adaptive equipment  Prior Device Use: Indicate devices/aids used by the patient prior to current illness, exacerbation or injury? Walker  Current Functional Level Cognition  Overall Cognitive Status: Within Functional Limits for tasks assessed Orientation Level: Oriented X4    Extremity Assessment (includes Sensation/Coordination)  Upper Extremity Assessment: LUE deficits/detail LUE Deficits / Details: splinted from forearm to MPs, full shoulder ROM, edematous fingers with bruising LUE: Unable to fully assess due to immobilization LUE Coordination: decreased fine motor  Lower Extremity Assessment: Defer to PT evaluation    ADLs  Overall ADL's : Needs assistance/impaired Eating/Feeding: Minimal assistance, Sitting Grooming:  Oral care, Minimal assistance, Sitting Grooming Details (indicate cue type and reason): minA for bimanual tasks;completed oral care sitting EOB Upper Body Bathing: Moderate assistance, Sitting Lower Body Bathing: Total assistance, +2 for physical assistance, Sit to/from stand Upper Body Dressing : Moderate assistance, Sitting Upper Body Dressing Details (indicate cue type and reason): front opening gown Lower Body Dressing: Total assistance, Sit to/from stand, +2 for physical assistance Toilet Transfer: Moderate assistance, +2 for physical assistance, +2 for safety/equipment, RW Toilet Transfer Details (  indicate cue type and reason): sit<>stand with lateral side stepping alongside EOB Toileting- Clothing Manipulation and Hygiene: +2 for physical assistance, Total assistance, Sit to/from stand Functional mobility during ADLs: Rolling walker, +2 for physical assistance, +2 for safety/equipment, Moderate assistance General ADL Comments: sat EOB for adl.  Pt requested pain medication at beginning of session as movement to EOB increased pain from 4-6.  Pain was decreased back to baseline at end of session, but repositioning spiked up to 8.  Pt reports pressure at L pelvis. Did not want to reposition off hip at end of session due to just having pain level decrease.  In supine, she is unable to reach peri area.  She is familar with AE, but she cannot yet move LLE on her own. She would benefit from reacher for bathing at this time.  (Pt is R dominant)    Mobility  Overal bed mobility: Needs Assistance Bed Mobility: Sit to Supine Supine to sit: Mod assist, +2 for physical assistance Sit to supine: Max assist General bed mobility comments: assisted B LE at same time while keeping knees flexed as pt was able to sidely onto right then carefully roll onto back to avoid L hip active ABD.  Positioned with multiple pillows to elevate L UE and position L LE to comfort.    Transfers  Overall transfer level: Needs  assistance Equipment used: None Transfers: Stand Pivot Transfers Sit to Stand: +2 physical assistance, Mod assist, +2 safety/equipment Stand pivot transfers: Mod assist General transfer comment: assisted back to bed 1/4 pivot from recliner to bed without walker such that pt could push off with R UE then transfer hand to bed rail to sure self in partial stance as therapist assist on L side.  Feeling more secure holding to "something solid" pt was able to turn and pivot with increased time.  Assisted with controlled desend onto bed.    Ambulation / Gait / Stairs / Wheelchair Mobility  Ambulation/Gait Ambulation/Gait assistance: Mod assist, +2 physical assistance, +2 safety/equipment Gait Distance (Feet): 1 Feet Assistive device: Left platform walker Gait Pattern/deviations: Step-to pattern, Decreased step length - right, Decreased step length - left, Shuffle, Trunk flexed General Gait Details: performed transfer only this afternnoon due to fatigue.  Pt was in recliner approx 5.5 hours and comfortable. Gait velocity: decreased    Posture / Balance Balance Overall balance assessment: Needs assistance Sitting-balance support: Feet supported, No upper extremity supported Sitting balance-Leahy Scale: Good Standing balance support: Bilateral upper extremity supported Standing balance-Leahy Scale: Poor    Special needs/care consideration BiPAP/CPAP : no CPM : no Continuous Drip IV : no Dialysis : no        Days : no Life Vest : no Oxygen : on RA Special Bed : no Trach Size : no Wound Vac (area)  :no      Location : no Skin: ecchymosis to hand, arm (right;left), skin tear to right arm, surgical incision to left hip and surgical incision to left wrist                             Bowel mgmt: last BM 12/06/19, continence Bladder mgmt: incontinence, urinary catheter in place Diabetic mgmt: no Behavioral consideration : no Chemo/radiation : no   Previous Home Environment (from acute therapy  documentation) Living Arrangements: Spouse/significant other, Children, Parent Available Help at Discharge: Family, Available 24 hours/day Type of Home: House Home Layout: Two level, Able to live on  main level with bedroom/bathroom Alternate Level Stairs-Number of Steps: 5 Home Access: Ramped entrance Bathroom Shower/Tub: Multimedia programmer: [place]: No Additional Comments: Pt lives with family but all members are restricted in ability to assist pt  Discharge Living Setting Plans for Discharge Living Setting: Patient's home, Lives with (comment)(husband and >32 yo son) Type of Home at Discharge: House Discharge Home Layout: Two level, Able to live on main level with bedroom/bathroom Alternate Level Stairs-Rails: None Alternate Level Stairs-Number of Steps: NA Discharge Home Access: Ramped entrance Discharge Bathroom Shower/Tub: Walk-in shower Discharge Bathroom Toilet: Standard Discharge Bathroom Accessibility: Yes How Accessible: Accessible via walker Does the patient have any problems obtaining your medications?: No  Social/Family/Support Systems Patient Roles: Spouse Contact Information: husband: Edison Nasuti (home: [phone]) (cell: [phone]) Anticipated Caregiver: husband (who had recent hip replacement but is Independent with a cane currently, and son (61 yo) who is attending college online. Husband will not go back to work until March 3rd so will be home Anticipated Caregiver's Contact Information: see above Ability/Limitations of Caregiver: supervision/Min G Caregiver Availability: 24/7 Discharge Plan Discussed with Primary Caregiver: Yes(pt and husband) Is Caregiver In Agreement with Plan?: Yes Does Caregiver/Family have Issues with Lodging/Transportation while Pt is in Rehab?: No  Goals/Additional Needs Patient/Family Goal for Rehab: PT/OT: Supervision SLP: NA Expected length of stay: 10-14 days  Cultural Considerations: TBD Dietary  Needs: regular diet, thin liquids Equipment Needs: TBD Additional Information: currently using [facility] with acute therapy Pt/Family Agrees to Admission and willing to participate: Yes Program Orientation Provided & Reviewed with Pt/Caregiver Including Roles  & Responsibilities: Yes(pt and her husband)  Barriers to Discharge: Lack of/limited family support, Weight bearing restrictions(husband recently had a hip replacement, son can help)  Decrease burden of Care through IP rehab admission: NA  Possible need for SNF placement upon discharge: Not anticipated. Pt has good support from her family at DC and has an accessible home entryway. Anticipate pt will progress quickly and return home at a level where she can be managed by her family.   Patient Condition: I have reviewed medical records from [facility], spoken with RN and MD, and patient and spouse. I discussed via phone for inpatient rehabilitation assessment.  Patient will benefit from ongoing PT and OT, can actively participate in 3 hours of therapy a day 5 days of the week, and can make measurable gains during the admission.  Patient will also benefit from the coordinated team approach during an Inpatient Acute Rehabilitation admission.  The patient will receive intensive therapy as well as Rehabilitation physician, nursing, social worker, and care management interventions.  Due to safety, skin/wound care, disease management, medication administration, pain management and patient education the patient requires 24 hour a day rehabilitation nursing.  The patient is currently Mod A + 2 with mobility and Min/Mod A for basic ADLs.  Discharge setting and therapy post discharge at home with home health is anticipated.  Patient has agreed to participate in the Acute Inpatient Rehabilitation Program and will admit 12/15/19.  Preadmission Screen Completed By:  Raechel Ache, 12/15/2019 10:59  AM ______________________________________________________________________   Discussed status with Dr. Posey Pronto on 12/15/19 at 10:57AM and received approval for admission today.  Admission Coordinator:  Raechel Ache, OT, time 10:57AM/Date 12/15/19    Assessment/Plan: Diagnosis: multi-ortho  1. Does the need for close, 24 hr/day Medical supervision in concert with the patient's rehab needs make it unreasonable for this patient to be served in a less intensive setting? Yes  2. Co-Morbidities requiring supervision/potential complications: polycystic kidney disease, chronic diastolic congestive heart failure, end-stage renal disease post renal transplant, asthma, hyperlipidemia 3. Due to bowel management, safety, skin/wound care, disease management, pain management and patient education, does the patient require 24 hr/day rehab nursing? Yes 4. Does the patient require coordinated care of a physician, rehab nurse, PT, OT, to address physical and functional deficits in the context of the above medical diagnosis(es)? Yes Addressing deficits in the following areas: balance, endurance, locomotion, strength, transferring, bathing, dressing, toileting and psychosocial support 5. Can the patient actively participate in an intensive therapy program of at least 3 hrs of therapy 5 days a week? Yes 6. The potential for patient to make measurable gains while on inpatient rehab is excellent 7. Anticipated functional outcomes upon discharge from inpatient rehab: min assist PT, min assist OT, n/a SLP 8. Estimated rehab length of stay to reach the above functional goals is: 13-17 days. 9. Anticipated discharge destination: Home 10. Overall Rehab/Functional Prognosis: excellent   MD Signature: Delice Lesch, MD, ABPMR

## 2019-12-13 LAB — CBC WITH DIFFERENTIAL/PLATELET
Abs Immature Granulocytes: 0.18 10*3/uL — ABNORMAL HIGH (ref 0.00–0.07)
Basophils Absolute: 0 10*3/uL (ref 0.0–0.1)
Basophils Relative: 0 %
Eosinophils Absolute: 0.1 10*3/uL (ref 0.0–0.5)
Eosinophils Relative: 1 %
HCT: 30.6 % — ABNORMAL LOW (ref 36.0–46.0)
Hemoglobin: 9.4 g/dL — ABNORMAL LOW (ref 12.0–15.0)
Immature Granulocytes: 2 %
Lymphocytes Relative: 12 %
Lymphs Abs: 1.1 10*3/uL (ref 0.7–4.0)
MCH: 29.1 pg (ref 26.0–34.0)
MCHC: 30.7 g/dL (ref 30.0–36.0)
MCV: 94.7 fL (ref 80.0–100.0)
Monocytes Absolute: 0.5 10*3/uL (ref 0.1–1.0)
Monocytes Relative: 5 %
Neutro Abs: 7.5 10*3/uL (ref 1.7–7.7)
Neutrophils Relative %: 80 %
Platelets: 120 10*3/uL — ABNORMAL LOW (ref 150–400)
RBC: 3.23 MIL/uL — ABNORMAL LOW (ref 3.87–5.11)
RDW: 18.2 % — ABNORMAL HIGH (ref 11.5–15.5)
WBC: 9.3 10*3/uL (ref 4.0–10.5)
nRBC: 0 % (ref 0.0–0.2)

## 2019-12-13 MED ORDER — DOCUSATE SODIUM 100 MG PO CAPS: 100.0000 mg | ORAL_CAPSULE | Freq: Two times a day (BID) | ORAL | 0 refills | Status: DC

## 2019-12-13 MED ORDER — HYDROCODONE-ACETAMINOPHEN 5-325 MG PO TABS: 1.0000 | ORAL_TABLET | ORAL | 0 refills | Status: DC | PRN

## 2019-12-13 MED ORDER — POLYETHYLENE GLYCOL 3350 17 G PO PACK: 17.0000 g | PACK | Freq: Every day | ORAL | 0 refills | Status: DC | PRN

## 2019-12-13 NOTE — Discharge Summary (Addendum)
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  X-ray imaging showed nondisplaced, noncomminuted fracture of the left mid femoral neck with mid to valgus angulation, comminuted fracture of the distal left radius.  Orthopedics consulted.  Status post left total hip arthroplasty and ORIF of left wrist fracture, repair of laceration of left forearm by orthopedics on 12/08/2019.  PT/OT evaluation done and recommended inpatient rehab.    She is medically stable for discharge to inpatient rehab as soon as bed is available.   Closed left hip fracture after mechanical fall:  X-ray finding as above.  Status post left total hip arthroplasty  by orthopedics on 12/08/2019.  Started on aspirin 325 mg daily  for DVT prophylaxis .PT/OT recommended CIR.  Left wrist fracture: Currently in sling.  S/P  ORIF of left wrist fracture, repair of laceration of left forearm by orthopedics on 12/08/2019.  Acute blood loss anemia: Secondary to surgery.  Status post 2 units of blood transfusion on 12/11/19.  Hemoglobin in the range of 9.Check CBC in a week.  Leukocytosis: Improved.   AKI: Resolved.  Kidney function at baseline.  Polycystic kidney disease, end-stage renal disease status post renal transplant: On prednisone, Prograf, tacrolimus, mycophenolate, Lasix at home.  Follows with Dr. Jimmy Footman  Mild intermittent asthma: Currently stable.  Not wheezing.  On inhalers at home.  She also had mild hypoxia on room air and requiring 2 L of oxygen  per minute. Likely secondary to atelectasis. Continue incentive spirometer.  Continue supplemental oxygen as needed.  GERD: Continue PPI  Chronic diastolic CHF: Currently euvolemic.  On Lasix at home.lasix on hold due to AKI.  Resume on discharge.    Discharge Diagnoses:  Principal Problem:   Closed left hip fracture (HCC) Active Problems:   End stage renal disease (HCC)   Asthma, mild intermittent   GERD (gastroesophageal reflux disease)   Renal transplant recipient   Hyperlipidemia   Chronic diastolic CHF (congestive heart failure) (HCC)   Fracture   Left wrist fracture    Discharge Instructions  Discharge Instructions    Diet - low sodium heart healthy   Complete by: As directed    Discharge instructions   Complete by: As directed    1)Please follow up with orthopedics in 2 weeks.  Name and number of the provider has been attached. 2)Do a CBC and BMP tests in a week.   Increase activity slowly   Complete by: As directed    Weight bearing as tolerated   Complete by: As directed    Put weight on forearm with triceps walker.  May weight-bear as tolerated on left lower extremity without hip precautions.  Do not put weight directly on wrist.   Laterality: left   Extremity: Upper     Allergies as of 12/13/2019      Reactions   Infed [iron Dextran] Other (See Comments)   Chest tightness   Pentamidine Itching, Shortness Of Breath, Swelling   Erythromycin [erythromycin] Other (See Comments)   Mouth Ulcers   Oxycodone Nausea Only, Nausea And Vomiting   Erythromycin Rash   Causes breakout in mouth  Ultram [tramadol Hcl] Anxiety      Medication List    TAKE these medications   acyclovir 400 MG tablet Commonly known as: ZOVIRAX Take 400 mg by mouth 2 (two) times daily as needed (outbreaks).   albuterol 108 (90 Base) MCG/ACT inhaler Commonly known as: VENTOLIN HFA Inhale 2 puffs into the lungs every 4 (four) hours as needed for wheezing or shortness of breath.    albuterol (2.5 MG/3ML) 0.083% nebulizer solution Commonly known as: PROVENTIL Take 3 mLs (2.5 mg total) by nebulization 3 (three) times daily as needed for wheezing or shortness of breath.   alendronate 70 MG tablet Commonly known as: FOSAMAX Take 70 mg by mouth every 7 (seven) days.   allopurinol 300 MG tablet Commonly known as: ZYLOPRIM Take 300 mg by mouth daily.   aspirin EC 325 MG tablet Take 1 tablet (325 mg total) by mouth daily after breakfast. Take x 1 month post op to decrease risk of blood clots. What changed:   medication strength  how much to take  when to take this  additional instructions   brimonidine 0.1 % Soln Commonly known as: ALPHAGAN P Place 1 drop 2 (two) times daily into the left eye.   cholecalciferol 25 MCG (1000 UNIT) tablet Commonly known as: VITAMIN D3 Take 1,000 Units by mouth daily.   Colchicine 0.6 MG Caps Take 0.6 mg by mouth daily as needed (gout).   cycloSPORINE 0.05 % ophthalmic emulsion Commonly known as: RESTASIS Place 1 drop into both eyes 2 (two) times daily.   docusate sodium 100 MG capsule Commonly known as: COLACE Take 1 capsule (100 mg total) by mouth 2 (two) times daily.   dorzolamide 2 % ophthalmic solution Commonly known as: TRUSOPT Place 1 drop into both eyes daily.   FLUoxetine 40 MG capsule Commonly known as: PROZAC Take 40 mg by mouth daily.   furosemide 40 MG tablet Commonly known as: LASIX Take 1 tablet by mouth daily.   HYDROcodone-acetaminophen 5-325 MG tablet Commonly known as: NORCO/VICODIN Take 1-2 tablets by mouth every 4 (four) hours as needed for moderate pain (pain score 4-6).   magnesium oxide 400 MG tablet Commonly known as: MAG-OX Take 400 mg by mouth 2 (two) times daily.   mycophenolate 180 MG EC tablet Commonly known as: MYFORTIC Take 180 mg by mouth 2 (two) times daily.   pantoprazole 40 MG tablet Commonly known as: PROTONIX Take 40 mg by mouth daily.   polyethylene glycol 17 g  packet Commonly known as: MIRALAX / GLYCOLAX Take 17 g by mouth daily as needed for mild constipation.   predniSONE 5 MG tablet Commonly known as: DELTASONE Take 7.5 mg by mouth daily.   Symbicort 160-4.5 MCG/ACT inhaler Generic drug: budesonide-formoterol Inhale 2 puffs into the lungs 2 (two) times daily.   tacrolimus 0.5 MG capsule Commonly known as: PROGRAF Take 0.5 mg by mouth at bedtime. Takes brand name Prograf   Prograf 1 MG capsule Generic drug: tacrolimus Take 1 mg by mouth daily.   Travatan Z 0.004 % Soln ophthalmic solution Generic drug: Travoprost (BAK Free) Place 1 drop into both eyes at bedtime.   vitamin B-12 100 MCG tablet Commonly known as: CYANOCOBALAMIN Take 100 mcg by mouth daily.            Discharge Care Instructions  (From admission, onward)         Start     Ordered   12/08/19 0000  Weight bearing as tolerated    Comments:  Put weight on forearm with triceps walker.  May weight-bear as tolerated on left lower extremity without hip precautions.  Do not put weight directly on wrist.  Question Answer Comment  Laterality left   Extremity Upper      12/08/19 1828         Follow-up Information    Dorna Leitz, MD. Schedule an appointment as soon as possible for a visit in 2 weeks.   Specialty: Orthopedic Surgery Contact information: [place] [address] [phone]          Allergies  Allergen Reactions  . Infed [Iron Dextran] Other (See Comments)    Chest tightness  . Pentamidine Itching, Shortness Of Breath and Swelling  . Erythromycin [Erythromycin] Other (See Comments)    Mouth Ulcers  . Oxycodone Nausea Only and Nausea And Vomiting  . Erythromycin Rash    Causes breakout in mouth  . Ultram [Tramadol Hcl] Anxiety    Consultations:  Orthopedics   Procedures/Studies: DG Chest 1 View  Result Date: 12/09/2019 CLINICAL DATA:  64 year old female with history of leukocytosis. EXAM: CHEST  1 VIEW  COMPARISON:  Chest x-ray 06/29/2019. FINDINGS: Elevation of the left hemidiaphragm. Chronic linear scarring in the left lung base, similar to prior studies. No acute consolidative airspace disease. No pleural effusions. No evidence of pulmonary edema. Mild cardiomegaly. Upper mediastinal contours are within normal limits. IMPRESSION: 1. Low lung volumes without radiographic evidence of acute cardiopulmonary disease. Electronically Signed   By: Vinnie Langton M.D.   On: 12/09/2019 14:31   DG Wrist 2 Views Left  Result Date: 12/07/2019 CLINICAL DATA:  Post reduction EXAM: LEFT WRIST - 2 VIEW COMPARISON:  12/06/2018 FINDINGS: Interval casting material limits bone detail. Acute markedly comminuted intra-articular distal radius fracture with residual 1/3 shaft diameter of dorsal displacement of distal fracture fragment, though improved alignment compared to previous. Decreased angulation and impaction. Acute comminuted distal ulna fracture, also with decreased angulation and impaction. IMPRESSION: Acute comminuted intra-articular distal radius fracture with decreased impaction and displacement. Acute comminuted distal ulna fracture, also with decreased impaction and angulation Electronically Signed   By: Donavan Foil M.D.   On: 12/07/2019 16:44   DG Wrist Complete Left  Result Date: 12/07/2019 CLINICAL DATA:  Fall.  Left wrist pain and swelling.  Deformity. EXAM: LEFT WRIST - COMPLETE 3+ VIEW COMPARISON:  None. FINDINGS: There is a comminuted, impacted and dorsally displaced fracture of the distal radius, with dorsal displacement approximately 1.5 cm. Apparent intra-articular component of the fracture. The dorsal articular surface is also angulated dorsally approximately 20 degrees. There is an associated fracture of the distal ulna, mildly comminuted, also somewhat impacted and dorsally angulated. The carpus displaces with the distal radial fracture component. There is no dislocation. No other fractures.  Skeletal structures are demineralized. There is surrounding soft tissue swelling. IMPRESSION: 1. Comminuted, dorsally displaced and dorsally angulated fracture of the distal left radius with an intra-articular component, associated with a mildly comminuted, dorsally angulated fracture of the distal ulna. No dislocation. Electronically Signed   By: Lajean Manes M.D.   On: 12/07/2019 13:51   Pelvis Portable  Result Date: 12/08/2019 CLINICAL DATA:  Status post hip replacement EXAM: PORTABLE PELVIS 1-2 VIEWS COMPARISON:  12/07/2019 FINDINGS: Interval left hip replacement with normal alignment. Apparent cortical fracture deformity at the inferior aspect of the greater trochanter. Gas in the soft tissues along with cutaneous staples. Possible acute left superior pubic ramus fracture. IMPRESSION: 1. Interval left hip replacement with intact hardware  and normal alignment. Expected postsurgical changes overlying left hip. 2. Cortex deformity at the inferior aspect of the greater trochanter on the left, suspect for fracture. Possible acute left superior pubic ramus fracture as well. Electronically Signed   By: Donavan Foil M.D.   On: 12/08/2019 18:54   DG C-Arm 1-60 Min-No Report  Result Date: 12/08/2019 Fluoroscopy was utilized by the requesting physician.  No radiographic interpretation.   DG HIP OPERATIVE UNILAT WITH PELVIS LEFT  Result Date: 12/08/2019 CLINICAL DATA:  Total left hip replacement. EXAM: OPERATIVE LEFT HIP (WITH PELVIS IF PERFORMED) 2 VIEWS TECHNIQUE: Fluoroscopic spot image(s) were submitted for interpretation post-operatively. COMPARISON:  12/07/2019. FINDINGS: Total left hip replacement. Hardware intact. Anatomic alignment. Pelvic calcifications consistent phleboliths. IMPRESSION: Total left hip replacement with anatomic alignment. Electronically Signed   By: Marcello Moores  Register   On: 12/08/2019 16:13   DG Hip Unilat With Pelvis 2-3 Views Left  Result Date: 12/07/2019 CLINICAL DATA:  Fall.   Left hip pain. EXAM: DG HIP (WITH OR WITHOUT PELVIS) 2-3V LEFT COMPARISON:  None. FINDINGS: Nondisplaced, non comminuted fracture across the mid left femoral neck. Mild valgus angulation. No other fractures. Hip joints, SI joints and symphysis pubis are normally aligned. Skeletal structures are demineralized. Soft tissues are unremarkable. IMPRESSION: 1. Nondisplaced, non comminuted fracture of the left mid femoral neck with mild valgus angulation. No dislocation. Electronically Signed   By: Lajean Manes M.D.   On: 12/07/2019 13:52       Subjective: Patient seen and examined at the bedside this morning.  Hemodynamically stable for discharge .  Discharge Exam: Vitals:   12/13/19 0452 12/13/19 0919  BP: 139/78   Pulse: 89   Resp: 18   Temp: 97.6 F (36.4 C)   SpO2: 98% 98%   Vitals:   12/12/19 1959 12/12/19 2121 12/13/19 0452 12/13/19 0919  BP:  (!) 153/66 139/78   Pulse:  96 89   Resp:  16 18   Temp:  98.6 F (37 C) 97.6 F (36.4 C)   TempSrc:  Oral    SpO2: 99% 99% 98% 98%  Weight:      Height:        General: Pt is alert, awake, not in acute distress Cardiovascular: RRR, S1/S2 +, no rubs, no gallops Respiratory: CTA bilaterally, no wheezing, no rhonchi Abdominal: Soft, NT, ND, bowel sounds + Extremities: no edema, no cyanosis, sling on the left arm, clean surgical wound on the left hip    The results of significant diagnostics from this hospitalization (including imaging, microbiology, ancillary and laboratory) are listed below for reference.     Microbiology: Recent Results (from the past 240 hour(s))  Respiratory Panel by RT PCR (Flu A&B, Covid) - Nasopharyngeal Swab     Status: None   Collection Time: 12/07/19  4:33 PM   Specimen: Nasopharyngeal Swab  Result Value Ref Range Status   SARS Coronavirus 2 by RT PCR NEGATIVE NEGATIVE Final    Comment: (NOTE) SARS-CoV-2 target nucleic acids are NOT DETECTED. The SARS-CoV-2 RNA is generally detectable in upper  respiratoy specimens during the acute phase of infection. The lowest concentration of SARS-CoV-2 viral copies this assay can detect is 131 copies/mL. A negative result does not preclude SARS-Cov-2 infection and should not be used as the sole basis for treatment or other patient management decisions. A negative result may occur with  improper specimen collection/handling, submission of specimen other than nasopharyngeal swab, presence of viral mutation(s) within the areas targeted by this assay,  and inadequate number of viral copies (<131 copies/mL). A negative result must be combined with clinical observations, patient history, and epidemiological information. The expected result is Negative. Fact Sheet for Patients:  PinkCheek.be Fact Sheet for Healthcare Providers:  GravelBags.it This test is not yet ap proved or cleared by the [place] FDA and  has been authorized for detection and/or diagnosis of SARS-CoV-2 by FDA under an Emergency Use Authorization (EUA). This EUA will remain  in effect (meaning this test can be used) for the duration of the COVID-19 declaration under Section 564(b)(1) of the Act, 21 U.S.C. section 360bbb-3(b)(1), unless the authorization is terminated or revoked sooner.    Influenza A by PCR NEGATIVE NEGATIVE Final   Influenza B by PCR NEGATIVE NEGATIVE Final    Comment: (NOTE) The Xpert Xpress SARS-CoV-2/FLU/RSV assay is intended as an aid in  the diagnosis of influenza from Nasopharyngeal swab specimens and  should not be used as a sole basis for treatment. Nasal washings and  aspirates are unacceptable for Xpert Xpress SARS-CoV-2/FLU/RSV  testing. Fact Sheet for Patients: PinkCheek.be Fact Sheet for Healthcare Providers: GravelBags.it This test is not yet approved or cleared by the [place] FDA and  has been authorized for  detection and/or diagnosis of SARS-CoV-2 by  FDA under an Emergency Use Authorization (EUA). This EUA will remain  in effect (meaning this test can be used) for the duration of the  Covid-19 declaration under Section 564(b)(1) of the Act, 21  U.S.C. section 360bbb-3(b)(1), unless the authorization is  terminated or revoked. Performed at [facility], [place] [address], [place], [place] [address]   MRSA PCR Screening     Status: None   Collection Time: 12/07/19  9:31 PM   Specimen: Nasal Mucosa; Nasopharyngeal  Result Value Ref Range Status   MRSA by PCR NEGATIVE NEGATIVE Final    Comment:        The GeneXpert MRSA Assay (FDA approved for NASAL specimens only), is one component of a comprehensive MRSA colonization surveillance program. It is not intended to diagnose MRSA infection nor to guide or monitor treatment for MRSA infections. Performed at [facility], [place] [address] [place],  [address]      Labs: BNP (last 3 results) Recent Labs    04/19/19 1244 06/29/19 1419  BNP 1,089.2* 992.4*   Basic Metabolic Panel: Recent Labs  Lab 12/07/19 1303 12/08/19 1518 12/08/19 1736 12/09/19 0457 12/10/19 0434 12/11/19 0345 12/12/19 0823  NA 140   < > 138 134* 133* 138 136  K 4.3   < > 4.9 4.5 4.4 4.7 4.6  CL 107  --  104 103 100 106 102  CO2 25  --   --  23 26 28 29   GLUCOSE 113*  --  225* 258* 150* 153* 108*  BUN 39*  --  28* 35* 38* 29* 21  CREATININE 1.02*  --  1.20* 1.42* 1.46* 1.31* 0.91  CALCIUM 9.6  --   --  8.1* 8.6* 8.6* 8.7*   < > = values in this interval not displayed.   Liver Function Tests: No results for input(s): AST, ALT, ALKPHOS, BILITOT, PROT, ALBUMIN in the last 168 hours. No results for input(s): LIPASE, AMYLASE in the last 168 hours. No results for input(s): AMMONIA in the last 168 hours. CBC: Recent Labs  Lab 12/09/19 0457 12/10/19 0434 12/11/19 0345 12/12/19 0413 12/13/19 0430  WBC 15.2* 12.0*  9.3 8.4 9.3  NEUTROABS 13.8* 10.3* 7.8* 6.8 7.5  HGB 9.0* 8.0* 7.6* 9.3* 9.4*  HCT 29.1* 26.1* 24.7* 29.8* 30.6*  MCV 94.2 94.6 97.2 93.7 94.7  PLT 130* 135* 109* 110* 120*   Cardiac Enzymes: No results for input(s): CKTOTAL, CKMB, CKMBINDEX, TROPONINI in the last 168 hours. BNP: Invalid input(s): POCBNP CBG: No results for input(s): GLUCAP in the last 168 hours. D-Dimer No results for input(s): DDIMER in the last 72 hours. Hgb A1c No results for input(s): HGBA1C in the last 72 hours. Lipid Profile No results for input(s): CHOL, HDL, LDLCALC, TRIG, CHOLHDL, LDLDIRECT in the last 72 hours. Thyroid function studies No results for input(s): TSH, T4TOTAL, T3FREE, THYROIDAB in the last 72 hours.  Invalid input(s): FREET3 Anemia work up No results for input(s): VITAMINB12, FOLATE, FERRITIN, TIBC, IRON, RETICCTPCT in the last 72 hours. Urinalysis    Component Value Date/Time   COLORURINE AMBER (A) 06/30/2019 1513   APPEARANCEUR HAZY (A) 06/30/2019 1513   LABSPEC 1.013 06/30/2019 1513   PHURINE 5.0 06/30/2019 1513   GLUCOSEU NEGATIVE 06/30/2019 1513   HGBUR SMALL (A) 06/30/2019 1513   BILIRUBINUR NEGATIVE 06/30/2019 1513   KETONESUR NEGATIVE 06/30/2019 1513   PROTEINUR 30 (A) 06/30/2019 1513   UROBILINOGEN 0.2 05/15/2013 0733   NITRITE NEGATIVE 06/30/2019 1513   LEUKOCYTESUR MODERATE (A) 06/30/2019 1513   Sepsis Labs Invalid input(s): PROCALCITONIN,  WBC,  LACTICIDVEN Microbiology Recent Results (from the past 240 hour(s))  Respiratory Panel by RT PCR (Flu A&B, Covid) - Nasopharyngeal Swab     Status: None   Collection Time: 12/07/19  4:33 PM   Specimen: Nasopharyngeal Swab  Result Value Ref Range Status   SARS Coronavirus 2 by RT PCR NEGATIVE NEGATIVE Final    Comment: (NOTE) SARS-CoV-2 target nucleic acids are NOT DETECTED. The SARS-CoV-2 RNA is generally detectable in upper respiratoy specimens during the acute phase of infection. The lowest concentration of  SARS-CoV-2 viral copies this assay can detect is 131 copies/mL. A negative result does not preclude SARS-Cov-2 infection and should not be used as the sole basis for treatment or other patient management decisions. A negative result may occur with  improper specimen collection/handling, submission of specimen other than nasopharyngeal swab, presence of viral mutation(s) within the areas targeted by this assay, and inadequate number of viral copies (<131 copies/mL). A negative result must be combined with clinical observations, patient history, and epidemiological information. The expected result is Negative. Fact Sheet for Patients:  PinkCheek.be Fact Sheet for Healthcare Providers:  GravelBags.it This test is not yet ap proved or cleared by the [place] FDA and  has been authorized for detection and/or diagnosis of SARS-CoV-2 by FDA under an Emergency Use Authorization (EUA). This EUA will remain  in effect (meaning this test can be used) for the duration of the COVID-19 declaration under Section 564(b)(1) of the Act, 21 U.S.C. section 360bbb-3(b)(1), unless the authorization is terminated or revoked sooner.    Influenza A by PCR NEGATIVE NEGATIVE Final   Influenza B by PCR NEGATIVE NEGATIVE Final    Comment: (NOTE) The Xpert Xpress SARS-CoV-2/FLU/RSV assay is intended as an aid in  the diagnosis of influenza from Nasopharyngeal swab specimens and  should not be used as a sole basis for treatment. Nasal washings and  aspirates are unacceptable for Xpert Xpress SARS-CoV-2/FLU/RSV  testing. Fact Sheet for Patients: PinkCheek.be Fact Sheet for Healthcare Providers: GravelBags.it This test is not yet approved or cleared by the [place] FDA and  has been authorized for detection and/or diagnosis of SARS-CoV-2 by  FDA under an Emergency Use Authorization (EUA).  This EUA will remain  in effect (meaning this test can be used) for the duration of the  Covid-19 declaration under Section 564(b)(1) of the Act, 21  U.S.C. section 360bbb-3(b)(1), unless the authorization is  terminated or revoked. Performed at [facility], [place] [address] [place], [place] [address]   MRSA PCR Screening     Status: None   Collection Time: 12/07/19  9:31 PM   Specimen: Nasal Mucosa; Nasopharyngeal  Result Value Ref Range Status   MRSA by PCR NEGATIVE NEGATIVE Final    Comment:        The GeneXpert MRSA Assay (FDA approved for NASAL specimens only), is one component of a comprehensive MRSA colonization surveillance program. It is not intended to diagnose MRSA infection nor to guide or monitor treatment for MRSA infections. Performed at [facility], [place] [address] [place],  [address]     Please note: You were cared for by a hospitalist during your hospital stay. Once you are discharged, your primary care physician will handle any further medical issues. Please note that NO REFILLS for any discharge medications will be authorized once you are discharged, as it is imperative that you return to your primary care physician (or establish a relationship with a primary care physician if you do not have one) for your post hospital discharge needs so that they can reassess your need for medications and monitor your lab values.    Time coordinating discharge: 40 minutes  SIGNED:   Shelly Coss, MD  [facility] 12/13/2019, 11:52 AM Pager [phone]  If 7PM-7AM, please contact night-coverage [email or web address] Password [email or web address]

## 2019-12-13 NOTE — Progress Notes (Signed)
Inpatient Rehabilitation-Admissions Coordinator   I do not have a bed available in IP Rehab for this patient today. I have notified both the patient and TOC team to make them aware. I will continue to follow along for bed availability for this patient if she remains in house.   Please call if questions.   Raechel Ache, OTR/L  Rehab Admissions Coordinator  [phone] 12/13/2019 10:59 AM

## 2019-12-13 NOTE — Progress Notes (Signed)
Occupational Therapy Treatment Patient Details Name: April [name] MRN: [id number] DOB: 09-15-1956 Today's Date: 12/13/2019    History of present illness Pt is a 64 year old woman admitted on 12/07/19 after falling in her kitchen and sustaining L wrist and L hip fractures. Underwent ORIF of L wrist and repair of forearm laceration and  L anterior total hip arthroplasty. PMH: arthritis, kidney transplant 2013, asthma.   OT comments  Pt was limited by pain, but she remains very motivated and willing to participate in therapy.  She is familiar with AE and would benefit from reacher for LB bathing only at this time due to inability to advance LLE.  Fingers have edema; repositioned and reinforced movement for edema management.   Follow Up Recommendations  CIR    Equipment Recommendations  3 in 1 bedside commode    Recommendations for Other Services      Precautions / Restrictions Precautions Precautions: Fall Required Braces or Orthoses: Sling Restrictions LUE Weight Bearing: Weight bear through elbow only LLE Weight Bearing: Weight bearing as tolerated       Mobility Bed Mobility         Supine to sit: Mod assist;+2 for physical assistance Sit to supine: Mod assist;+2 for physical assistance;+2 for safety/equipment;Max assist   General bed mobility comments: assist for legs and trunk.  NT assisted  Transfers                      Balance     Sitting balance-Leahy Scale: Good                                     ADL either performed or assessed with clinical judgement   ADL       Grooming: Oral care;Minimal assistance;Sitting   Upper Body Bathing: Moderate assistance;Sitting       Upper Body Dressing : Moderate assistance;Sitting                     General ADL Comments: sat EOB for adl.  Pt requested pain medication at beginning of session as movement to EOB increased pain from 4-6.  Pain was decreased back to baseline at end  of session, but repositioning spiked up to 8.  Pt reports pressure at L pelvis. Did not want to reposition off hip at end of session due to just having pain level decrease.  In supine, she is unable to reach peri area.  She is familar with AE, but she cannot yet move LLE on her own. She would benefit from reacher for bathing at this time.  (Pt is R dominant)     Vision       Perception     Praxis      Cognition Arousal/Alertness: Awake/alert Behavior During Therapy: WFL for tasks assessed/performed Overall Cognitive Status: Within Functional Limits for tasks assessed                                          Exercises     Shoulder Instructions       General Comments L fingers remain swollen. Pt reports she has been moving them. She cannot fully extend actively, but can do so passively. Repositioned up at end of session    Pertinent Vitals/ Pain  Pain Score: (4-8) Pain Location: L hip Pain Descriptors / Indicators: Pressure Pain Intervention(s): Limited activity within patient's tolerance;Monitored during session;Repositioned;Patient requesting pain meds-RN notified;RN gave pain meds during session  Home Living                                          Prior Functioning/Environment              Frequency  Min 2X/week        Progress Toward Goals  OT Goals(current goals can now be found in the care plan section)  Progress towards OT goals: Progressing toward goals     Plan      Co-evaluation                 AM-PAC OT "6 Clicks" Daily Activity     Outcome Measure   Help from another person eating meals?: A Little Help from another person taking care of personal grooming?: A Little Help from another person toileting, which includes using toliet, bedpan, or urinal?: A Lot Help from another person bathing (including washing, rinsing, drying)?: A Lot Help from another person to put on and taking off regular upper  body clothing?: A Lot Help from another person to put on and taking off regular lower body clothing?: A Lot 6 Click Score: 14    End of Session    OT Visit Diagnosis: Unsteadiness on feet (R26.81);Pain;Muscle weakness (generalized) (M62.81) Pain - Right/Left: Left Pain - part of body: Arm;Hip   Activity Tolerance Patient limited by pain   Patient Left in bed;with call bell/phone within reach;with bed alarm set   Nurse Communication          Time: 5521-7471 OT Time Calculation (min): 44 min  Charges: OT General Charges $OT Visit: 1 Visit OT Treatments $Self Care/Home Management : 23-37 mins $Therapeutic Activity: 8-22 mins  Senya Hinzman S, OTR/L Acute Rehabilitation Services 12/13/2019   Gunther Zawadzki 12/13/2019, 11:10 AM

## 2019-12-13 NOTE — H&P (Addendum)
Physical Medicine and Rehabilitation Admission H&P    Chief Complaint  Patient presents with  . Fall  . Wrist Pain  . Hip Pain  : HPI: April [name] is a 64 year old right-handed female with history of polycystic kidney disease, chronic diastolic congestive heart failure, end-stage renal disease post renal transplant 06/02/2012 with baseline creatinine 1.02-1.29 maintained on Prograf, chronic prednisone as well as Myfortic followed by renal service Dr. Jimmy Footman, asthma, remote tobacco abuse 21 years ago, hyperlipidemia.  History taken from chart review and patient. Patient lives with spouse and family.  Two-level home bed and bath on main level with ramped entrance.  Independent with rolling walker prior to admission.  Presented 12/07/2019 after fall in her kitchen without LOC, landing on her left side. X-rays and imaging revealed left displaced femoral neck fracture as well as severely comminuted distal radius fracture on the left.  Underwent left total hip replacement anterior approach as well as ORIF left distal radius fracture 12/08/2019 per Dr. Berenice Primas as well as repair of forearm laceration.  Hospital course further complicated by pain. Weightbearing as tolerated through elbow only of left upper extremity weightbearing as tolerated left lower extremity.  No left hip abduction.  Acute blood loss anemia status post 2 units blood transfusion 12/11/2019 with latest hemoglobin 9.8 on 12/15/2019  Maintained on aspirin 325 mg daily for DVT prophylaxis.  Her renal function remained stable with latest creatinine 0.91.  Therapy evaluations completed and patient was admitted for a comprehensive rehab program. Please see preadmission assessment from earlier today as well.   Review of Systems  Constitutional: Negative for fever.  HENT: Negative for hearing loss.   Eyes: Negative for blurred vision and double vision.  Respiratory: Negative for cough and shortness of breath.   Cardiovascular: Negative  for chest pain and palpitations.  Gastrointestinal: Positive for constipation. Negative for heartburn, nausea and vomiting.       GERD  Genitourinary: Negative for dysuria, flank pain and hematuria.  Musculoskeletal: Positive for joint pain and myalgias.  Neurological: Positive for sensory change, focal weakness and weakness.  Psychiatric/Behavioral: Positive for depression. The patient has insomnia.    Past Medical History:  Diagnosis Date  . Arthritis   . Asthma   . Depression   . GERD (gastroesophageal reflux disease)   . Hyperlipidemia   . Hyperparathyroidism   . Polycystic kidney   . PONV (postoperative nausea and vomiting)    Past Surgical History:  Procedure Laterality Date  . 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 BILATERAL OOPHORECTOMY    . CARPAL TUNNEL RELEASE  2000  . CESAREAN SECTION    . INSERTION OF DIALYSIS CATHETER  03/31/2012   Procedure: INSERTION OF DIALYSIS CATHETER;  Surgeon: Angelia Mould, MD;  Location: [facility];  Service: Vascular;  Laterality: N/A;  insertion of dialysis catheter right internal jugular vein  . 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 ORIF TIBIA FRACTURE Left 12/23/2018   Procedure: OPEN REDUCTION INTERNAL FIXATION (ORIF) TIBIA FRACTURE;  Surgeon: Shona Needles, MD;  Location: [facility];  Service: Orthopedics;  Laterality: Left;  . ORIF TIBIA PLATEAU Right 12/23/2018   Procedure: OPEN REDUCTION INTERNAL FIXATION (ORIF) TIBIAL PLATEAU;  Surgeon: Shona Needles, MD;  Location: [facility];  Service: Orthopedics;  Laterality: Right;  . ORIF WRIST FRACTURE Left 12/08/2019   Procedure: OPEN REDUCTION INTERNAL FIXATION (ORIF) WRIST FRACTURE, REPAIR LACERATION LEFT FOREARM;  Surgeon: Dorna Leitz, MD;  Location: [facility];  Service: Orthopedics;  Laterality: Left;  . TONSILLECTOMY  1967  . TOTAL HIP ARTHROPLASTY Left 12/08/2019   Procedure: TOTAL HIP ARTHROPLASTY ANTERIOR APPROACH;  Surgeon: Dorna Leitz, MD;  Location: [facility];   Service: Orthopedics;  Laterality: Left;  . TUBAL LIGATION  2010   Family History  Problem Relation Age of Onset  . Hypertension Mother   . Heart disease Father        CABG history  . Polycystic kidney disease Father   . Polycystic kidney disease Brother    Social History:  reports that she quit smoking about 21 years ago. Her smoking use included cigarettes. She has a 3.75 pack-year smoking history. She has never used smokeless tobacco. She reports that she does not drink alcohol or use drugs. Allergies:  Allergies  Allergen Reactions  . Infed [Iron Dextran] Other (See Comments)    Chest tightness  . Pentamidine Itching, Shortness Of Breath and Swelling  . Erythromycin [Erythromycin] Other (See Comments)    Mouth Ulcers  . Oxycodone Nausea Only and Nausea And Vomiting  . Erythromycin Rash    Causes breakout in mouth  . Ultram [Tramadol Hcl] Anxiety   Medications Prior to Admission  Medication Sig Dispense Refill  . acyclovir (ZOVIRAX) 400 MG tablet Take 400 mg by mouth 2 (two) times daily as needed (outbreaks).     [name] albuterol (PROVENTIL HFA;VENTOLIN HFA) 108 (90 Base) MCG/ACT inhaler Inhale 2 puffs into the lungs every 4 (four) hours as needed for wheezing or shortness of breath. 18 g 0  . albuterol (PROVENTIL) (2.5 MG/3ML) 0.083% nebulizer solution Take 3 mLs (2.5 mg total) by nebulization 3 (three) times daily as needed for wheezing or shortness of breath. 75 mL 0  . alendronate (FOSAMAX) 70 MG tablet Take 70 mg by mouth every 7 (seven) days.    [name] allopurinol (ZYLOPRIM) 300 MG tablet Take 300 mg by mouth daily.    [name] aspirin EC 81 MG tablet Take 81 mg by mouth daily.    . brimonidine (ALPHAGAN P) 0.1 % SOLN Place 1 drop 2 (two) times daily into the left eye.    . cholecalciferol (VITAMIN D3) 25 MCG (1000 UT) tablet Take 1,000 Units by mouth daily.     . Colchicine 0.6 MG CAPS Take 0.6 mg by mouth daily as needed (gout).     . cycloSPORINE (RESTASIS) 0.05 % ophthalmic emulsion  Place 1 drop into both eyes 2 (two) times daily.    . dorzolamide (TRUSOPT) 2 % ophthalmic solution Place 1 drop into both eyes daily.    [name] FLUoxetine (PROZAC) 40 MG capsule Take 40 mg by mouth daily.    . furosemide (LASIX) 40 MG tablet Take 1 tablet by mouth daily.     . magnesium oxide (MAG-OX) 400 MG tablet Take 400 mg by mouth 2 (two) times daily.    . mycophenolate (MYFORTIC) 180 MG EC tablet Take 180 mg by mouth 2 (two) times daily.    . pantoprazole (PROTONIX) 40 MG tablet Take 40 mg by mouth daily.     . predniSONE (DELTASONE) 5 MG tablet Take 7.5 mg by mouth daily.     [name] PROGRAF 1 MG capsule Take 1 mg by mouth daily.    . SYMBICORT 160-4.5 MCG/ACT inhaler Inhale 2 puffs into the lungs 2 (two) times daily.    . tacrolimus (PROGRAF) 0.5 MG capsule Take 0.5 mg by mouth at bedtime. Takes brand name Prograf    . TRAVATAN Z 0.004 %  SOLN ophthalmic solution Place 1 drop into both eyes at bedtime.    . vitamin B-12 (CYANOCOBALAMIN) 100 MCG tablet Take 100 mcg by mouth daily.      Drug Regimen Review Drug regimen was reviewed and remains appropriate with no significant issues identified  Home: Home Living Family/patient expects to be discharged to:: Private residence Living Arrangements: Spouse/significant other, Children, Parent Available Help at Discharge: Family, Available 24 hours/day Type of Home: House Home Access: Ramped entrance Home Layout: Two level, Able to live on main level with bedroom/bathroom Alternate Level Stairs-Number of Steps: 5 Bathroom Shower/Tub: Multimedia programmer: Standard Home Equipment: Environmental consultant - 2 wheels, Wheelchair - manual, Bedside commode, Shower seat, Financial controller: Secondary school teacher, Sock aid Additional Comments: Pt lives with family but all members are restricted in ability to assist pt   Functional History: Prior Function Level of Independence: Independent with assistive device(s) Comments: ambulates with  RW  Functional Status:  Mobility: Bed Mobility Overal bed mobility: Needs Assistance Bed Mobility: Sit to Supine Supine to sit: Mod assist, +2 for physical assistance Sit to supine: Max assist General bed mobility comments: assisted B LE at same time while keeping knees flexed as pt was able to sidely onto right then carefully roll onto back to avoid L hip active ABD.  Positioned with multiple pillows to elevate L UE and position L LE to comfort. Transfers Overall transfer level: Needs assistance Equipment used: None Transfers: Stand Pivot Transfers Sit to Stand: +2 physical assistance, Mod assist, +2 safety/equipment Stand pivot transfers: Mod assist General transfer comment: assisted back to bed 1/4 pivot from recliner to bed without walker such that pt could push off with R UE then transfer hand to bed rail to sure self in partial stance as therapist assist on L side.  Feeling more secure holding to "something solid" pt was able to turn and pivot with increased time.  Assisted with controlled desend onto bed. Ambulation/Gait Ambulation/Gait assistance: Mod assist, +2 physical assistance, +2 safety/equipment Gait Distance (Feet): 1 Feet Assistive device: Left platform walker Gait Pattern/deviations: Step-to pattern, Decreased step length - right, Decreased step length - left, Shuffle, Trunk flexed General Gait Details: performed transfer only this afternnoon due to fatigue.  Pt was in recliner approx 5.5 hours and comfortable. Gait velocity: decreased    ADL: ADL Overall ADL's : Needs assistance/impaired Eating/Feeding: Minimal assistance, Sitting Grooming: Oral care, Minimal assistance, Sitting Grooming Details (indicate cue type and reason): minA for bimanual tasks;completed oral care sitting EOB Upper Body Bathing: Moderate assistance, Sitting Lower Body Bathing: Total assistance, +2 for physical assistance, Sit to/from stand Upper Body Dressing : Moderate assistance,  Sitting Upper Body Dressing Details (indicate cue type and reason): front opening gown Lower Body Dressing: Total assistance, Sit to/from stand, +2 for physical assistance Toilet Transfer: Moderate assistance, +2 for physical assistance, +2 for safety/equipment, RW Toilet Transfer Details (indicate cue type and reason): sit<>stand with lateral side stepping alongside EOB Toileting- Clothing Manipulation and Hygiene: +2 for physical assistance, Total assistance, Sit to/from stand Functional mobility during ADLs: Rolling walker, +2 for physical assistance, +2 for safety/equipment, Moderate assistance General ADL Comments: sat EOB for adl.  Pt requested pain medication at beginning of session as movement to EOB increased pain from 4-6.  Pain was decreased back to baseline at end of session, but repositioning spiked up to 8.  Pt reports pressure at L pelvis. Did not want to reposition off hip at end of session due to just having pain  level decrease.  In supine, she is unable to reach peri area.  She is familar with AE, but she cannot yet move LLE on her own. She would benefit from reacher for bathing at this time.  (Pt is R dominant)  Cognition: Cognition Overall Cognitive Status: Within Functional Limits for tasks assessed Orientation Level: Oriented X4 Cognition Arousal/Alertness: Awake/alert Behavior During Therapy: WFL for tasks assessed/performed Overall Cognitive Status: Within Functional Limits for tasks assessed  Physical Exam: Blood pressure 138/63, pulse 96, temperature 97.7 F (36.5 C), temperature source Oral, resp. rate 20, height 5\' 1"  (1.549 m), weight 79.4 kg, SpO2 99 %. Physical Exam  Vitals reviewed. Constitutional: She is oriented to person, place, and time. She appears well-developed.  Morbidly obese  HENT:  Head: Normocephalic and atraumatic.  Eyes: EOM are normal. Right eye exhibits no discharge. Left eye exhibits no discharge.  Neck: No tracheal deviation present. No  thyromegaly present.  Respiratory: Effort normal. No stridor. No respiratory distress.  GI: Soft. She exhibits distension.  Musculoskeletal:     Comments: Generalized edema  Neurological: She is alert and oriented to person, place, and time.  Patient is alert and oriented x3 Follows full commands. Motor: RUE: 4-/5 proximal to distal LLE: 3+-4-/5 proximal to distal LUE: proximally limited by splint, hand grip 2/5 LLE: 2+/5 proximal to distal Sensation diminished to light touch b/le feet  Skin:  Left hip incision with dressing clean/dry/intact.   Left upper extremity with sugar tong splint in place. Scattered ecchymosis   Psychiatric: She has a normal mood and affect. Her behavior is normal.    Results for orders placed or performed during the hospital encounter of 12/07/19 (from the past 48 hour(s))  CBC with Differential/Platelet     Status: Abnormal   Collection Time: 12/14/19  4:16 AM  Result Value Ref Range   WBC 11.5 (H) 4.0 - 10.5 K/uL   RBC 3.39 (L) 3.87 - 5.11 MIL/uL   Hemoglobin 9.8 (L) 12.0 - 15.0 g/dL   HCT 32.6 (L) 36.0 - 46.0 %   MCV 96.2 80.0 - 100.0 fL   MCH 28.9 26.0 - 34.0 pg   MCHC 30.1 30.0 - 36.0 g/dL   RDW 18.0 (H) 11.5 - 15.5 %   Platelets 145 (L) 150 - 400 K/uL   nRBC 0.0 0.0 - 0.2 %   Neutrophils Relative % 82 %   Neutro Abs 9.4 (H) 1.7 - 7.7 K/uL   Lymphocytes Relative 10 %   Lymphs Abs 1.1 0.7 - 4.0 K/uL   Monocytes Relative 5 %   Monocytes Absolute 0.6 0.1 - 1.0 K/uL   Eosinophils Relative 1 %   Eosinophils Absolute 0.1 0.0 - 0.5 K/uL   Basophils Relative 0 %   Basophils Absolute 0.0 0.0 - 0.1 K/uL   Immature Granulocytes 2 %   Abs Immature Granulocytes 0.23 (H) 0.00 - 0.07 K/uL    Comment: Performed at [facility], [place] [address], [place], [place] [address]  CBC with Differential/Platelet     Status: Abnormal   Collection Time: 12/15/19  3:53 AM  Result Value Ref Range   WBC 11.9 (H) 4.0 - 10.5 K/uL   RBC 3.38 (L) 3.87  - 5.11 MIL/uL   Hemoglobin 9.8 (L) 12.0 - 15.0 g/dL   HCT 32.6 (L) 36.0 - 46.0 %   MCV 96.4 80.0 - 100.0 fL   MCH 29.0 26.0 - 34.0 pg   MCHC 30.1 30.0 - 36.0 g/dL   RDW 17.9 (  H) 11.5 - 15.5 %   Platelets 155 150 - 400 K/uL   nRBC 0.0 0.0 - 0.2 %   Neutrophils Relative % 81 %   Neutro Abs 9.8 (H) 1.7 - 7.7 K/uL   Lymphocytes Relative 10 %   Lymphs Abs 1.2 0.7 - 4.0 K/uL   Monocytes Relative 5 %   Monocytes Absolute 0.5 0.1 - 1.0 K/uL   Eosinophils Relative 1 %   Eosinophils Absolute 0.1 0.0 - 0.5 K/uL   Basophils Relative 0 %   Basophils Absolute 0.0 0.0 - 0.1 K/uL   Immature Granulocytes 3 %   Abs Immature Granulocytes 0.32 (H) 0.00 - 0.07 K/uL    Comment: Performed at [facility], [place] [address], [place], [place] [address]   No results found.     Medical Problem List and Plan: 1.  Decreased functional mobility secondary to left displaced femoral neck fracture status post left total hip replacement anterior approach as well as severely left comminuted distal radius fracture status post ORIF 12/08/2019 as well as forearm laceration repair.  Weightbearing as tolerated through left elbow only and weightbearing as tolerated left lower extremity.  No left hip abduction  -patient may not shower  -ELOS/Goals: 16-20 days/Min/Mod A  Admit to CIR 2.  Antithrombotics: -DVT/anticoagulation: SCDs.  Dopplers ordered.  -antiplatelet therapy: Aspirin 325 mg daily 3. Pain Management: Hydrocodone and Robaxin as needed  Monitor with increased mobility. 4. Mood: Prozac 40 mg daily  -antipsychotic agents: N/A 5. Neuropsych: This patient is capable of making decisions on her own behalf. 6. Skin/Wound Care: Routine skin checks 7. Fluids/Electrolytes/Nutrition: Routine in and outs. CMP ordered. 8.  Polycystic kidney disease status post renal transplant 06/02/2012.  Baseline creatinine 1.02-1.29.  Continue Prograf, chronic prednisone as well as Myfortic.  Followed by Dr.  Jimmy Footman  CMP ordered. 9.  Acute blood loss anemia.  Transfused 2 units packed red blood cells 12/11/2019.    CBC ordered. 10.  Asthma.  Continue inhalers as directed. 11.  History of gout.  Allopurinol daily.  Monitor for any gout flareups 12.  Chronic diastolic congestive heart failure.  Lasix 40 mg daily. Monitor for sign/symptoms of fluid overload. Daily weights.  Lavon Paganini Angiulli, PA-C 12/15/2019  I have personally performed a face to face diagnostic evaluation, including, but not limited to relevant history and physical exam findings, of this patient and developed relevant assessment and plan.  Additionally, I have reviewed and concur with the physician assistant's documentation above.  Delice Lesch, MD, ABPMR

## 2019-12-14 LAB — CBC WITH DIFFERENTIAL/PLATELET
Abs Immature Granulocytes: 0.23 10*3/uL — ABNORMAL HIGH (ref 0.00–0.07)
Basophils Absolute: 0 10*3/uL (ref 0.0–0.1)
Basophils Relative: 0 %
Eosinophils Absolute: 0.1 10*3/uL (ref 0.0–0.5)
Eosinophils Relative: 1 %
HCT: 32.6 % — ABNORMAL LOW (ref 36.0–46.0)
Hemoglobin: 9.8 g/dL — ABNORMAL LOW (ref 12.0–15.0)
Immature Granulocytes: 2 %
Lymphocytes Relative: 10 %
Lymphs Abs: 1.1 10*3/uL (ref 0.7–4.0)
MCH: 28.9 pg (ref 26.0–34.0)
MCHC: 30.1 g/dL (ref 30.0–36.0)
MCV: 96.2 fL (ref 80.0–100.0)
Monocytes Absolute: 0.6 10*3/uL (ref 0.1–1.0)
Monocytes Relative: 5 %
Neutro Abs: 9.4 10*3/uL — ABNORMAL HIGH (ref 1.7–7.7)
Neutrophils Relative %: 82 %
Platelets: 145 10*3/uL — ABNORMAL LOW (ref 150–400)
RBC: 3.39 MIL/uL — ABNORMAL LOW (ref 3.87–5.11)
RDW: 18 % — ABNORMAL HIGH (ref 11.5–15.5)
WBC: 11.5 10*3/uL — ABNORMAL HIGH (ref 4.0–10.5)
nRBC: 0 % (ref 0.0–0.2)

## 2019-12-14 NOTE — Progress Notes (Signed)
Physical Therapy Treatment Patient Details Name: April [name] MRN: [id number] DOB: Nov 21, 1956 Today's Date: 12/14/2019    History of Present Illness Pt is a 64 year old woman admitted on 12/07/19 after falling in her kitchen and sustaining L wrist and L hip fractures. Underwent ORIF of L wrist and repair of forearm laceration and  L anterior total hip arthroplasty. PMH: arthritis, kidney transplant 2013, asthma.    PT Comments    Pt performed therex program with assist.  OOB deferred to after additional pain meds at pt request.     Follow Up Recommendations  CIR     Equipment Recommendations  Other (comment)    Recommendations for Other Services OT consult     Precautions / Restrictions Precautions Precautions: Fall Required Braces or Orthoses: Sling Restrictions Weight Bearing Restrictions: Yes LUE Weight Bearing: Weight bear through elbow only LLE Weight Bearing: Weight bearing as tolerated Other Position/Activity Restrictions: no L hip active abduction    Mobility  Bed Mobility               General bed mobility comments: deferred to after additional pain meds at pt request  Transfers                    Ambulation/Gait                 Stairs             Wheelchair Mobility    Modified Rankin (Stroke Patients Only)       Balance                                            Cognition Arousal/Alertness: Awake/alert Behavior During Therapy: WFL for tasks assessed/performed Overall Cognitive Status: Within Functional Limits for tasks assessed                                        Exercises Total Joint Exercises Ankle Circles/Pumps: AROM;Both;20 reps;Supine Quad Sets: AROM;Both;10 reps;Supine Gluteal Sets: AROM;Both;10 reps;Supine Short Arc Quad: AAROM;Left;10 reps;Supine Heel Slides: AAROM;20 reps;Left;Supine    General Comments        Pertinent Vitals/Pain Pain Assessment:  0-10 Pain Score: 5  Pain Location: L hip/pelvis Pain Descriptors / Indicators: Pressure;Sore Pain Intervention(s): Limited activity within patient's tolerance;Monitored during session;Premedicated before session    Home Living                      Prior Function            PT Goals (current goals can now be found in the care plan section) Acute Rehab PT Goals Patient Stated Goal: to return to PLOF PT Goal Formulation: With patient Time For Goal Achievement: 12/23/19 Potential to Achieve Goals: Fair Progress towards PT goals: Progressing toward goals    Frequency    Min 4X/week      PT Plan Current plan remains appropriate    Co-evaluation              AM-PAC PT "6 Clicks" Mobility   Outcome Measure  Help needed turning from your back to your side while in a flat bed without using bedrails?: A Lot Help needed moving from lying on your back to sitting on the side of a  flat bed without using bedrails?: A Lot Help needed moving to and from a bed to a chair (including a wheelchair)?: A Lot Help needed standing up from a chair using your arms (e.g., wheelchair or bedside chair)?: A Lot Help needed to walk in hospital room?: A Lot Help needed climbing 3-5 steps with a railing? : Total 6 Click Score: 11    End of Session   Activity Tolerance: Patient tolerated treatment well Patient left: in bed;with call bell/phone within reach;with bed alarm set Nurse Communication: Mobility status PT Visit Diagnosis: Difficulty in walking, not elsewhere classified (R26.2);Muscle weakness (generalized) (M62.81)     Time: 2111-5520 PT Time Calculation (min) (ACUTE ONLY): 17 min  Charges:  $Therapeutic Exercise: 8-22 mins                     Debe Coder PT Acute Rehabilitation Services Pager [phone] Office [phone]    Jahlil Ziller 12/14/2019, 9:57 AM

## 2019-12-14 NOTE — Progress Notes (Signed)
Physical Therapy Treatment Patient Details Name: April [name] MRN: [id number] DOB: 09-Jan-1956 Today's Date: 12/14/2019    History of Present Illness Pt is a 64 year old woman admitted on 12/07/19 after falling in her kitchen and sustaining L wrist and L hip fractures. Underwent ORIF of L wrist and repair of forearm laceration and  L anterior total hip arthroplasty. PMH: arthritis, kidney transplant 2013, asthma.    PT Comments    Pt continues cooperative and progressing but slowly with all mobility tasks.  Pt continues to require increased time and assist of 2 for safe performance of all mobility tasks.   Follow Up Recommendations  CIR     Equipment Recommendations  Other (comment)    Recommendations for Other Services OT consult     Precautions / Restrictions Precautions Precautions: Fall Required Braces or Orthoses: Sling Restrictions Weight Bearing Restrictions: Yes LUE Weight Bearing: Weight bear through elbow only LLE Weight Bearing: Weight bearing as tolerated Other Position/Activity Restrictions: no L hip active abduction    Mobility  Bed Mobility Overal bed mobility: Needs Assistance Bed Mobility: Supine to Sit     Supine to sit: Mod assist;+2 for physical assistance     General bed mobility comments: Increased time with cues for sequence and use of R LE to self assist.  Physical assist to manage L LE and to bring trunk to upright  Transfers Overall transfer level: Needs assistance Equipment used: Left platform walker Transfers: Sit to/from Stand;Stand Pivot Transfers Sit to Stand: +2 physical assistance;Mod assist;+2 safety/equipment Stand pivot transfers: Min assist;Mod assist;+2 safety/equipment       General transfer comment: up from EOB to take single step and stand/pvt with PFRL to sit in recliner  Ambulation/Gait Ambulation/Gait assistance: Mod assist;+2 physical assistance;+2 safety/equipment Gait Distance (Feet): 1 Feet Assistive device:  Left platform walker Gait Pattern/deviations: Step-to pattern;Decreased step length - right;Decreased step length - left;Shuffle;Trunk flexed Gait velocity: decreased   General Gait Details: cues for sequence, posture and position from RW.     Stairs             Wheelchair Mobility    Modified Rankin (Stroke Patients Only)       Balance Overall balance assessment: Needs assistance Sitting-balance support: Feet supported;No upper extremity supported Sitting balance-Leahy Scale: Good     Standing balance support: Bilateral upper extremity supported Standing balance-Leahy Scale: Poor                              Cognition Arousal/Alertness: Awake/alert Behavior During Therapy: WFL for tasks assessed/performed Overall Cognitive Status: Within Functional Limits for tasks assessed                                        Exercises Total Joint Exercises Ankle Circles/Pumps: AROM;Both;20 reps;Supine Quad Sets: AROM;Both;10 reps;Supine Gluteal Sets: AROM;Both;10 reps;Supine Short Arc Quad: AAROM;Left;10 reps;Supine Heel Slides: AAROM;20 reps;Left;Supine    General Comments        Pertinent Vitals/Pain Pain Assessment: 0-10 Pain Score: 5  Pain Location: L hip/pelvis Pain Descriptors / Indicators: Pressure;Sore Pain Intervention(s): Limited activity within patient's tolerance;Monitored during session;Premedicated before session    Home Living                      Prior Function  PT Goals (current goals can now be found in the care plan section) Acute Rehab PT Goals Patient Stated Goal: to return to PLOF PT Goal Formulation: With patient Time For Goal Achievement: 12/23/19 Potential to Achieve Goals: Fair Progress towards PT goals: Progressing toward goals    Frequency    Min 4X/week      PT Plan Current plan remains appropriate    Co-evaluation              AM-PAC PT "6 Clicks" Mobility    Outcome Measure  Help needed turning from your back to your side while in a flat bed without using bedrails?: A Lot Help needed moving from lying on your back to sitting on the side of a flat bed without using bedrails?: A Lot Help needed moving to and from a bed to a chair (including a wheelchair)?: A Lot Help needed standing up from a chair using your arms (e.g., wheelchair or bedside chair)?: A Lot Help needed to walk in hospital room?: A Lot Help needed climbing 3-5 steps with a railing? : Total 6 Click Score: 11    End of Session Equipment Utilized During Treatment: Gait belt Activity Tolerance: Patient tolerated treatment well Patient left: in chair;with call bell/phone within reach;with chair alarm set Nurse Communication: Mobility status PT Visit Diagnosis: Difficulty in walking, not elsewhere classified (R26.2);Muscle weakness (generalized) (M62.81)     Time: 5784-6962 PT Time Calculation (min) (ACUTE ONLY): 28 min  Charges:  $Therapeutic Exercise: 8-22 mins $Therapeutic Activity: 23-37 mins                     Debe Coder PT Acute Rehabilitation Services Pager [phone] Office [phone]    [facility] 12/14/2019, 12:33 PM

## 2019-12-14 NOTE — Progress Notes (Signed)
Inpatient Rehabilitation-Admissions Coordinator   I do not have a bed available for this patient today. Will continue to follow for possible admission pending bed availability. TOC team notified.   Raechel Ache, OTR/L  Rehab Admissions Coordinator  [phone] 12/14/2019 9:56 AM

## 2019-12-14 NOTE — Progress Notes (Signed)
Physical Therapy Treatment Patient Details Name: April [name] MRN: [id number] DOB: 09-24-1956 Today's Date: 12/14/2019    History of Present Illness Pt is a 64 year old woman admitted on 12/07/19 after falling in her kitchen and sustaining L wrist and L hip fractures. Underwent ORIF of L wrist and repair of forearm laceration and  L anterior total hip arthroplasty. PMH: arthritis, kidney transplant 2013, asthma.    PT Comments    POD # 6 pm session Assisted pt back to bed.  General transfer comment: assisted back to bed 1/4 pivot from recliner to bed without walker such that pt could push off with R UE then transfer hand to bed rail to sure self in partial stance as therapist assist on L side.  Feeling more secure holding to "something solid" pt was able to turn and pivot with increased time.  Assisted with controlled desend onto bed. General bed mobility comments: assisted B LE at same time while keeping knees flexed as pt was able to sidely onto right then carefully roll onto back to avoid L hip active ABD.  Positioned with multiple pillows to elevate L UE and position L LE to comfort.  Follow Up Recommendations  CIR     Equipment Recommendations  Other (comment)    Recommendations for Other Services OT consult     Precautions / Restrictions Precautions Precautions: Fall Required Braces or Orthoses: Sling Restrictions Weight Bearing Restrictions: Yes LUE Weight Bearing: Weight bear through elbow only LLE Weight Bearing: Weight bearing as tolerated Other Position/Activity Restrictions: no L hip active abduction    Mobility  Bed Mobility Overal bed mobility: Needs Assistance Bed Mobility: Sit to Supine     Supine to sit: Mod assist;+2 for physical assistance Sit to supine: Max assist   General bed mobility comments: assisted B LE at same time while keeping knees flexed as pt was able to sidely onto right then carefully roll onto back to avoid L hip active ABD.   Positioned with multiple pillows to elevate L UE and position L LE to comfort.  Transfers Overall transfer level: Needs assistance Equipment used: None Transfers: Stand Pivot Transfers Sit to Stand: +2 physical assistance;Mod assist;+2 safety/equipment Stand pivot transfers: Mod assist       General transfer comment: assisted back to bed 1/4 pivot from recliner to bed without walker such that pt could push off with R UE then transfer hand to bed rail to sure self in partial stance as therapist assist on L side.  Feeling more secure holding to "something solid" pt was able to turn and pivot with increased time.  Assisted with controlled desend onto bed.  Ambulation/Gait Ambulation/Gait assistance: Mod assist;+2 physical assistance;+2 safety/equipment Gait Distance (Feet): 1 Feet Assistive device: Left platform walker Gait Pattern/deviations: Step-to pattern;Decreased step length - right;Decreased step length - left;Shuffle;Trunk flexed Gait velocity: decreased   General Gait Details: performed transfer only this afternnoon due to fatigue.  Pt was in recliner approx 5.5 hours and comfortable.   Stairs             Wheelchair Mobility    Modified Rankin (Stroke Patients Only)       Balance Overall balance assessment: Needs assistance Sitting-balance support: Feet supported;No upper extremity supported Sitting balance-Leahy Scale: Good     Standing balance support: Bilateral upper extremity supported Standing balance-Leahy Scale: Poor  Cognition Arousal/Alertness: Awake/alert Behavior During Therapy: WFL for tasks assessed/performed Overall Cognitive Status: Within Functional Limits for tasks assessed                                        Exercises      General Comments        Pertinent Vitals/Pain Pain Assessment: 0-10 Pain Score: 6  Pain Location: L hip Pain Descriptors / Indicators:  Grimacing;Operative site guarding;Tightness Pain Intervention(s): Monitored during session;Repositioned    Home Living                      Prior Function            PT Goals (current goals can now be found in the care plan section) Acute Rehab PT Goals Patient Stated Goal: to return to PLOF PT Goal Formulation: With patient Time For Goal Achievement: 12/23/19 Potential to Achieve Goals: Fair Progress towards PT goals: Progressing toward goals    Frequency    Min 4X/week      PT Plan Current plan remains appropriate    Co-evaluation              AM-PAC PT "6 Clicks" Mobility   Outcome Measure  Help needed turning from your back to your side while in a flat bed without using bedrails?: A Lot Help needed moving from lying on your back to sitting on the side of a flat bed without using bedrails?: A Lot Help needed moving to and from a bed to a chair (including a wheelchair)?: A Lot Help needed standing up from a chair using your arms (e.g., wheelchair or bedside chair)?: A Lot Help needed to walk in hospital room?: Total Help needed climbing 3-5 steps with a railing? : Total 6 Click Score: 10    End of Session Equipment Utilized During Treatment: Gait belt Activity Tolerance: Patient tolerated treatment well Patient left: in bed;with bed alarm set;with call bell/phone within reach Nurse Communication: Mobility status PT Visit Diagnosis: Difficulty in walking, not elsewhere classified (R26.2);Muscle weakness (generalized) (M62.81)     Time: 3662-9476 PT Time Calculation (min) (ACUTE ONLY): 18 min  Charges:  $Therapeutic Activity: 8-22 mins                     Rica Koyanagi  PTA Acute  Rehabilitation Services Pager      [phone] Office      [phone]

## 2019-12-14 NOTE — Progress Notes (Signed)
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  X-ray imaging showed nondisplaced, noncomminuted fracture of the left mid femoral neck with mid to valgus angulation, comminuted fracture of the distal left radius.  Orthopedics consulted.  Status post left total hip arthroplasty and ORIF of left wrist fracture, repair of laceration of left forearm by orthopedics on 12/08/2019.  PT/OT evaluation done and recommended inpatient rehab.    She is medically stable for discharge to inpatient rehab as soon as bed is available.  Assessment & Plan:   Principal Problem:   Closed left hip fracture (HCC) Active Problems:   End stage renal disease (HCC)   Asthma, mild intermittent   GERD (gastroesophageal reflux disease)   Renal transplant recipient   Hyperlipidemia   Chronic diastolic CHF (congestive heart failure) (HCC)   Fracture   Left wrist fracture  Closed left hip fracture after mechanical fall: X-ray finding as above.  Status post left total hip arthroplasty by orthopedics on 12/08/2019. Started on aspirin 325 mg daily  for DVT prophylaxis .PT/OT recommended CIR.  Left wrist fracture:Currently in sling. S/P ORIF of left wrist fracture, repair of laceration of left forearm by orthopedics on 12/08/2019.  Acute blood loss anemia:Secondary to surgery. Status post 2 units of blood transfusion on 12/11/19.Hemoglobin in the range of 9.Check CBC in a week.  Leukocytosis: Improved.   IDP:[id number]. Kidney function at baseline.  Polycystic kidney disease, end-stage renal disease status post renal transplant: On prednisone, Prograf, tacrolimus, mycophenolate, Lasix at home. Follows with Dr.  Jimmy Footman  Mild intermittent asthma: Currently stable. Not wheezing. On inhalers at home. She also had mild hypoxia on room air and requiring 2 L of oxygen per minute. Likely secondary to atelectasis.Continue incentive spirometer.  Continue supplemental oxygen as needed.  GERD: Continue PPI  Chronic diastolic RWE:[id number] euvolemic. On Lasix at home.lasix on hold due to AKI.Resume on discharge.         DVT prophylaxis:SCD, aspirin Code Status: Full code Family Communication: Discussed with husband on phone on 12/10/19 Disposition Plan: Patient is from home.  She will be discharged to CIR as per PT recommendation.  Waiting for bed. Consultants: Orthopedics  Procedures: None  Antimicrobials:  Anti-infectives (From admission, onward)   Start     Dose/Rate Route Frequency Ordered Stop   12/08/19 2200  ceFAZolin (ANCEF) IVPB 2g/100 mL premix     2 g 200 mL/hr over 30 Minutes Intravenous Every 6 hours 12/08/19 2053 12/09/19 0559   12/08/19 0600  ceFAZolin (ANCEF) IVPB 2g/100 mL premix     2 g 200 mL/hr over 30 Minutes Intravenous On call to O.R. 12/07/19 2132 12/08/19 1412      Subjective:  Patient seen and examined at the bedside this morning.  Hemodynamically stable. Stable  for discharge to CIR whenever possible. Objective: Vitals:   12/13/19 1406 12/13/19 2028 12/13/19 2219 12/14/19 0544  BP:   (!) 143/68 139/66  Pulse: (!) 101  (!) 103 93  Resp:   18 16  Temp:   98 F (36.7 C) (!) 97.4 F (36.3 C)  TempSrc:   Oral Oral  SpO2: 94% 92% 100% 100%  Weight:      Height:        Intake/Output Summary (Last 24  hours) at 12/14/2019 1049 Last data filed at 12/14/2019 0900 Gross per 24 hour  Intake 1200 ml  Output 800 ml  Net 400 ml   Filed Weights   12/07/19 1304  Weight: 79.4 kg    Examination:   General exam: Obese, comfortable Respiratory: No wheezes or crackles, decreased air entry in the bases   Cardiovascular system: S1 & S2 heard, RRR.   Gastrointestinal system: Abdomen is nondistended, soft and nontender. No organomegaly or masses felt. Normal bowel sounds heard. Central nervous system: Alert and oriented. No focal neurological deficits. Extremities: Sling on the left arm, clean surgical wound on the left hip   Data Reviewed: I have personally reviewed following labs and imaging studies  CBC: Recent Labs  Lab 12/10/19 0434 12/11/19 0345 12/12/19 0413 12/13/19 0430 12/14/19 0416  WBC 12.0* 9.3 8.4 9.3 11.5*  NEUTROABS 10.3* 7.8* 6.8 7.5 9.4*  HGB 8.0* 7.6* 9.3* 9.4* 9.8*  HCT 26.1* 24.7* 29.8* 30.6* 32.6*  MCV 94.6 97.2 93.7 94.7 96.2  PLT 135* 109* 110* 120* 381*   Basic Metabolic Panel: Recent Labs  Lab 12/07/19 1303 12/08/19 1518 12/08/19 1736 12/09/19 0457 12/10/19 0434 12/11/19 0345 12/12/19 0823  NA 140   < > 138 134* 133* 138 136  K 4.3   < > 4.9 4.5 4.4 4.7 4.6  CL 107  --  104 103 100 106 102  CO2 25  --   --  23 26 28 29   GLUCOSE 113*  --  225* 258* 150* 153* 108*  BUN 39*  --  28* 35* 38* 29* 21  CREATININE 1.02*  --  1.20* 1.42* 1.46* 1.31* 0.91  CALCIUM 9.6  --   --  8.1* 8.6* 8.6* 8.7*   < > = values in this interval not displayed.   GFR: Estimated Creatinine Clearance: 61.1 mL/min (by C-G formula based on SCr of 0.91 mg/dL). Liver Function Tests: No results for input(s): AST, ALT, ALKPHOS, BILITOT, PROT, ALBUMIN in the last 168 hours. No results for input(s): LIPASE, AMYLASE in the last 168 hours. No results for input(s): AMMONIA in the last 168 hours. Coagulation Profile: No results for input(s): INR, PROTIME in the last 168 hours. Cardiac Enzymes: No results for input(s): CKTOTAL, CKMB, CKMBINDEX, TROPONINI in the last 168 hours. BNP (last 3 results) No results for input(s): PROBNP in the last 8760 hours. HbA1C: No results for input(s): HGBA1C in the last 72 hours. CBG: No results for input(s): GLUCAP in the last 168 hours. Lipid Profile: No results for input(s): CHOL,  HDL, LDLCALC, TRIG, CHOLHDL, LDLDIRECT in the last 72 hours. Thyroid Function Tests: No results for input(s): TSH, T4TOTAL, FREET4, T3FREE, THYROIDAB in the last 72 hours. Anemia Panel: No results for input(s): VITAMINB12, FOLATE, FERRITIN, TIBC, IRON, RETICCTPCT in the last 72 hours. Sepsis Labs: No results for input(s): PROCALCITON, LATICACIDVEN in the last 168 hours.  Recent Results (from the past 240 hour(s))  Respiratory Panel by RT PCR (Flu A&B, Covid) - Nasopharyngeal Swab     Status: None   Collection Time: 12/07/19  4:33 PM   Specimen: Nasopharyngeal Swab  Result Value Ref Range Status   SARS Coronavirus 2 by RT PCR NEGATIVE NEGATIVE Final    Comment: (NOTE) SARS-CoV-2 target nucleic acids are NOT DETECTED. The SARS-CoV-2 RNA is generally detectable in upper respiratoy specimens during the acute phase of infection. The lowest concentration of SARS-CoV-2 viral copies this assay can detect is 131 copies/mL. A negative result does not preclude SARS-Cov-2 infection  and should not be used as the sole basis for treatment or other patient management decisions. A negative result may occur with  improper specimen collection/handling, submission of specimen other than nasopharyngeal swab, presence of viral mutation(s) within the areas targeted by this assay, and inadequate number of viral copies (<131 copies/mL). A negative result must be combined with clinical observations, patient history, and epidemiological information. The expected result is Negative. Fact Sheet for Patients:  PinkCheek.be Fact Sheet for Healthcare Providers:  GravelBags.it This test is not yet ap proved or cleared by the [place] FDA and  has been authorized for detection and/or diagnosis of SARS-CoV-2 by FDA under an Emergency Use Authorization (EUA). This EUA will remain  in effect (meaning this test can be used) for the duration of the COVID-19  declaration under Section 564(b)(1) of the Act, 21 U.S.C. section 360bbb-3(b)(1), unless the authorization is terminated or revoked sooner.    Influenza A by PCR NEGATIVE NEGATIVE Final   Influenza B by PCR NEGATIVE NEGATIVE Final    Comment: (NOTE) The Xpert Xpress SARS-CoV-2/FLU/RSV assay is intended as an aid in  the diagnosis of influenza from Nasopharyngeal swab specimens and  should not be used as a sole basis for treatment. Nasal washings and  aspirates are unacceptable for Xpert Xpress SARS-CoV-2/FLU/RSV  testing. Fact Sheet for Patients: PinkCheek.be Fact Sheet for Healthcare Providers: GravelBags.it This test is not yet approved or cleared by the [place] FDA and  has been authorized for detection and/or diagnosis of SARS-CoV-2 by  FDA under an Emergency Use Authorization (EUA). This EUA will remain  in effect (meaning this test can be used) for the duration of the  Covid-19 declaration under Section 564(b)(1) of the Act, 21  U.S.C. section 360bbb-3(b)(1), unless the authorization is  terminated or revoked. Performed at [facility], [place] [address] [place], [place] [address]   MRSA PCR Screening     Status: None   Collection Time: 12/07/19  9:31 PM   Specimen: Nasal Mucosa; Nasopharyngeal  Result Value Ref Range Status   MRSA by PCR NEGATIVE NEGATIVE Final    Comment:        The GeneXpert MRSA Assay (FDA approved for NASAL specimens only), is one component of a comprehensive MRSA colonization surveillance program. It is not intended to diagnose MRSA infection nor to guide or monitor treatment for MRSA infections. Performed at [facility], [place] [address], [place], [place] [address]          Radiology Studies: No results found.      Scheduled Meds: . allopurinol  300 mg Oral Daily  . aspirin EC  325 mg Oral Q breakfast  . brimonidine  1 drop Left Eye  BID  . Chlorhexidine Gluconate Cloth  6 each Topical Daily  . cholecalciferol  1,000 Units Oral Daily  . cycloSPORINE  1 drop Both Eyes BID  . docusate sodium  100 mg Oral BID  . dorzolamide  1 drop Both Eyes Daily  . FLUoxetine  40 mg Oral Daily  . latanoprost  1 drop Both Eyes QHS  . mometasone-formoterol  2 puff Inhalation BID  . mycophenolate  180 mg Oral BID  . pantoprazole  40 mg Oral Daily  . predniSONE  7.5 mg Oral Q breakfast  . tacrolimus  0.5 mg Oral QHS  . tacrolimus  1 mg Oral Daily  . vitamin B-12  100 mcg Oral Daily   Continuous Infusions: . methocarbamol (ROBAXIN) IV  LOS: 7 days    Time spent: 25 min.,More than 50% of that time was spent in counseling and/or coordination of care.      Shelly Coss, MD [facility] Pager [phone]  If 7PM-7AM, please contact night-coverage [email or web address] Password [facility] 12/14/2019, 10:49 AM

## 2019-12-15 ENCOUNTER — Other Ambulatory Visit: Payer: Self-pay

## 2019-12-15 ENCOUNTER — Inpatient Hospital Stay (HOSPITAL_COMMUNITY)
Admission: RE | Admit: 2019-12-15 | Discharge: 2020-01-05 | DRG: 560 | Disposition: A | Discharge status: Home Health | Payer: Medicare Other | Source: Other Acute Inpatient Hospital | Attending: Physical Medicine & Rehabilitation | Admitting: Physical Medicine & Rehabilitation

## 2019-12-15 ENCOUNTER — Encounter (HOSPITAL_COMMUNITY): Payer: Self-pay | Admitting: Physical Medicine & Rehabilitation

## 2019-12-15 DIAGNOSIS — J45909 Unspecified asthma, uncomplicated: Secondary | ICD-10-CM | POA: Diagnosis present

## 2019-12-15 DIAGNOSIS — Z471 Aftercare following joint replacement surgery: Secondary | ICD-10-CM | POA: Diagnosis present

## 2019-12-15 DIAGNOSIS — S32512A Fracture of superior rim of left pubis, initial encounter for closed fracture: Secondary | ICD-10-CM

## 2019-12-15 DIAGNOSIS — S32512S Fracture of superior rim of left pubis, sequela: Secondary | ICD-10-CM | POA: Diagnosis not present

## 2019-12-15 DIAGNOSIS — Z96642 Presence of left artificial hip joint: Secondary | ICD-10-CM

## 2019-12-15 DIAGNOSIS — M109 Gout, unspecified: Secondary | ICD-10-CM | POA: Diagnosis present

## 2019-12-15 DIAGNOSIS — Z7951 Long term (current) use of inhaled steroids: Secondary | ICD-10-CM

## 2019-12-15 DIAGNOSIS — S32592A Other specified fracture of left pubis, initial encounter for closed fracture: Secondary | ICD-10-CM | POA: Clinically undetermined

## 2019-12-15 DIAGNOSIS — N186 End stage renal disease: Secondary | ICD-10-CM

## 2019-12-15 DIAGNOSIS — Z8249 Family history of ischemic heart disease and other diseases of the circulatory system: Secondary | ICD-10-CM

## 2019-12-15 DIAGNOSIS — K219 Gastro-esophageal reflux disease without esophagitis: Secondary | ICD-10-CM | POA: Diagnosis present

## 2019-12-15 DIAGNOSIS — S52502D Unspecified fracture of the lower end of left radius, subsequent encounter for closed fracture with routine healing: Secondary | ICD-10-CM | POA: Diagnosis not present

## 2019-12-15 DIAGNOSIS — Z885 Allergy status to narcotic agent status: Secondary | ICD-10-CM | POA: Diagnosis not present

## 2019-12-15 DIAGNOSIS — S72002A Fracture of unspecified part of neck of left femur, initial encounter for closed fracture: Secondary | ICD-10-CM | POA: Diagnosis not present

## 2019-12-15 DIAGNOSIS — R52 Pain, unspecified: Secondary | ICD-10-CM

## 2019-12-15 DIAGNOSIS — Q613 Polycystic kidney, unspecified: Secondary | ICD-10-CM

## 2019-12-15 DIAGNOSIS — X58XXXA Exposure to other specified factors, initial encounter: Secondary | ICD-10-CM | POA: Clinically undetermined

## 2019-12-15 DIAGNOSIS — Z79899 Other long term (current) drug therapy: Secondary | ICD-10-CM

## 2019-12-15 DIAGNOSIS — Z87891 Personal history of nicotine dependence: Secondary | ICD-10-CM | POA: Diagnosis not present

## 2019-12-15 DIAGNOSIS — M545 Low back pain, unspecified: Secondary | ICD-10-CM

## 2019-12-15 DIAGNOSIS — D62 Acute posthemorrhagic anemia: Secondary | ICD-10-CM

## 2019-12-15 DIAGNOSIS — F329 Major depressive disorder, single episode, unspecified: Secondary | ICD-10-CM | POA: Diagnosis present

## 2019-12-15 DIAGNOSIS — Z940 Kidney transplant status: Secondary | ICD-10-CM

## 2019-12-15 DIAGNOSIS — W19XXXD Unspecified fall, subsequent encounter: Secondary | ICD-10-CM | POA: Diagnosis present

## 2019-12-15 DIAGNOSIS — M1990 Unspecified osteoarthritis, unspecified site: Secondary | ICD-10-CM | POA: Diagnosis present

## 2019-12-15 DIAGNOSIS — K5900 Constipation, unspecified: Secondary | ICD-10-CM | POA: Diagnosis present

## 2019-12-15 DIAGNOSIS — F4310 Post-traumatic stress disorder, unspecified: Secondary | ICD-10-CM | POA: Diagnosis present

## 2019-12-15 DIAGNOSIS — E871 Hypo-osmolality and hyponatremia: Secondary | ICD-10-CM | POA: Diagnosis present

## 2019-12-15 DIAGNOSIS — E785 Hyperlipidemia, unspecified: Secondary | ICD-10-CM | POA: Diagnosis present

## 2019-12-15 DIAGNOSIS — Z7983 Long term (current) use of bisphosphonates: Secondary | ICD-10-CM

## 2019-12-15 DIAGNOSIS — F411 Generalized anxiety disorder: Secondary | ICD-10-CM

## 2019-12-15 DIAGNOSIS — Z7982 Long term (current) use of aspirin: Secondary | ICD-10-CM

## 2019-12-15 DIAGNOSIS — E213 Hyperparathyroidism, unspecified: Secondary | ICD-10-CM | POA: Diagnosis present

## 2019-12-15 DIAGNOSIS — S3210XA Unspecified fracture of sacrum, initial encounter for closed fracture: Secondary | ICD-10-CM | POA: Clinically undetermined

## 2019-12-15 DIAGNOSIS — S72002D Fracture of unspecified part of neck of left femur, subsequent encounter for closed fracture with routine healing: Secondary | ICD-10-CM | POA: Diagnosis not present

## 2019-12-15 DIAGNOSIS — M62838 Other muscle spasm: Secondary | ICD-10-CM | POA: Diagnosis not present

## 2019-12-15 DIAGNOSIS — Z7952 Long term (current) use of systemic steroids: Secondary | ICD-10-CM | POA: Diagnosis not present

## 2019-12-15 DIAGNOSIS — Z6833 Body mass index (BMI) 33.0-33.9, adult: Secondary | ICD-10-CM

## 2019-12-15 DIAGNOSIS — S5292XD Unspecified fracture of left forearm, subsequent encounter for closed fracture with routine healing: Secondary | ICD-10-CM | POA: Diagnosis not present

## 2019-12-15 DIAGNOSIS — F419 Anxiety disorder, unspecified: Secondary | ICD-10-CM

## 2019-12-15 DIAGNOSIS — Z881 Allergy status to other antibiotic agents status: Secondary | ICD-10-CM | POA: Diagnosis not present

## 2019-12-15 DIAGNOSIS — Z8271 Family history of polycystic kidney: Secondary | ICD-10-CM

## 2019-12-15 DIAGNOSIS — M7989 Other specified soft tissue disorders: Secondary | ICD-10-CM | POA: Diagnosis not present

## 2019-12-15 DIAGNOSIS — Z888 Allergy status to other drugs, medicaments and biological substances status: Secondary | ICD-10-CM | POA: Diagnosis not present

## 2019-12-15 DIAGNOSIS — I5032 Chronic diastolic (congestive) heart failure: Secondary | ICD-10-CM

## 2019-12-15 DIAGNOSIS — R5383 Other fatigue: Secondary | ICD-10-CM | POA: Diagnosis not present

## 2019-12-15 DIAGNOSIS — G8918 Other acute postprocedural pain: Secondary | ICD-10-CM

## 2019-12-15 LAB — CBC WITH DIFFERENTIAL/PLATELET
Abs Immature Granulocytes: 0.32 10*3/uL — ABNORMAL HIGH (ref 0.00–0.07)
Basophils Absolute: 0 10*3/uL (ref 0.0–0.1)
Basophils Relative: 0 %
Eosinophils Absolute: 0.1 10*3/uL (ref 0.0–0.5)
Eosinophils Relative: 1 %
HCT: 32.6 % — ABNORMAL LOW (ref 36.0–46.0)
Hemoglobin: 9.8 g/dL — ABNORMAL LOW (ref 12.0–15.0)
Immature Granulocytes: 3 %
Lymphocytes Relative: 10 %
Lymphs Abs: 1.2 10*3/uL (ref 0.7–4.0)
MCH: 29 pg (ref 26.0–34.0)
MCHC: 30.1 g/dL (ref 30.0–36.0)
MCV: 96.4 fL (ref 80.0–100.0)
Monocytes Absolute: 0.5 10*3/uL (ref 0.1–1.0)
Monocytes Relative: 5 %
Neutro Abs: 9.8 10*3/uL — ABNORMAL HIGH (ref 1.7–7.7)
Neutrophils Relative %: 81 %
Platelets: 155 10*3/uL (ref 150–400)
RBC: 3.38 MIL/uL — ABNORMAL LOW (ref 3.87–5.11)
RDW: 17.9 % — ABNORMAL HIGH (ref 11.5–15.5)
WBC: 11.9 10*3/uL — ABNORMAL HIGH (ref 4.0–10.5)
nRBC: 0 % (ref 0.0–0.2)

## 2019-12-15 MED ORDER — ALLOPURINOL 300 MG PO TABS
300.0000 mg | ORAL_TABLET | Freq: Every day | ORAL | Status: DC
  Administered 2019-12-16 – 2020-01-05 (×21): 300 mg via ORAL
  Filled 2019-12-15 (×21): qty 1

## 2019-12-15 MED ORDER — PANTOPRAZOLE SODIUM 40 MG PO TBEC
40.0000 mg | DELAYED_RELEASE_TABLET | Freq: Every day | ORAL | Status: DC
  Administered 2019-12-16 – 2019-12-18 (×3): 40 mg via ORAL
  Filled 2019-12-15 (×3): qty 1

## 2019-12-15 MED ORDER — DORZOLAMIDE HCL 2 % OP SOLN
1.0000 [drp] | Freq: Every day | OPHTHALMIC | Status: DC
  Administered 2019-12-16 – 2020-01-05 (×21): 1 [drp] via OPHTHALMIC
  Filled 2019-12-15: qty 10

## 2019-12-15 MED ORDER — ASPIRIN EC 325 MG PO TBEC
325.0000 mg | DELAYED_RELEASE_TABLET | Freq: Every day | ORAL | Status: DC
  Administered 2019-12-16 – 2020-01-05 (×21): 325 mg via ORAL
  Filled 2019-12-15 (×21): qty 1

## 2019-12-15 MED ORDER — VITAMIN D 25 MCG (1000 UNIT) PO TABS
1000.0000 [IU] | ORAL_TABLET | Freq: Every day | ORAL | Status: DC
  Administered 2019-12-16 – 2020-01-05 (×19): 1000 [IU] via ORAL
  Filled 2019-12-15 (×19): qty 1

## 2019-12-15 MED ORDER — SORBITOL 70 % SOLN
30.0000 mL | Freq: Every day | Status: DC | PRN
  Administered 2019-12-16 – 2019-12-20 (×3): 30 mL via ORAL
  Filled 2019-12-15 (×5): qty 30

## 2019-12-15 MED ORDER — BRIMONIDINE TARTRATE 0.15 % OP SOLN
1.0000 [drp] | Freq: Two times a day (BID) | OPHTHALMIC | Status: DC
  Administered 2019-12-15 – 2020-01-05 (×42): 1 [drp] via OPHTHALMIC
  Filled 2019-12-15: qty 5

## 2019-12-15 MED ORDER — METHOCARBAMOL 1000 MG/10ML IJ SOLN
500.0000 mg | Freq: Four times a day (QID) | INTRAVENOUS | Status: DC | PRN
  Filled 2019-12-15: qty 5

## 2019-12-15 MED ORDER — FLUOXETINE HCL 20 MG PO CAPS
40.0000 mg | ORAL_CAPSULE | Freq: Every day | ORAL | Status: DC
  Administered 2019-12-16 – 2020-01-05 (×21): 40 mg via ORAL
  Filled 2019-12-15 (×22): qty 2

## 2019-12-15 MED ORDER — LATANOPROST 0.005 % OP SOLN
1.0000 [drp] | Freq: Every day | OPHTHALMIC | Status: DC
  Administered 2019-12-15 – 2020-01-04 (×21): 1 [drp] via OPHTHALMIC
  Filled 2019-12-15: qty 2.5

## 2019-12-15 MED ORDER — PREDNISONE 5 MG PO TABS
7.5000 mg | ORAL_TABLET | Freq: Every day | ORAL | Status: DC
  Administered 2019-12-16 – 2020-01-05 (×21): 7.5 mg via ORAL
  Filled 2019-12-15 (×21): qty 2

## 2019-12-15 MED ORDER — TACROLIMUS 1 MG PO CAPS
1.0000 mg | ORAL_CAPSULE | Freq: Every day | ORAL | Status: DC
  Administered 2019-12-16 – 2020-01-05 (×21): 1 mg via ORAL
  Filled 2019-12-15 (×22): qty 1

## 2019-12-15 MED ORDER — HYDROCODONE-ACETAMINOPHEN 5-325 MG PO TABS
1.0000 | ORAL_TABLET | ORAL | Status: DC | PRN
  Administered 2019-12-15 – 2019-12-20 (×10): 2 via ORAL
  Filled 2019-12-15 (×2): qty 2
  Filled 2019-12-15: qty 1
  Filled 2019-12-15 (×6): qty 2
  Filled 2019-12-15: qty 1
  Filled 2019-12-15 (×2): qty 2

## 2019-12-15 MED ORDER — TACROLIMUS 0.5 MG PO CAPS
0.5000 mg | ORAL_CAPSULE | Freq: Every day | ORAL | Status: DC
  Administered 2019-12-15 – 2020-01-04 (×21): 0.5 mg via ORAL
  Filled 2019-12-15 (×22): qty 1

## 2019-12-15 MED ORDER — POLYETHYLENE GLYCOL 3350 17 G PO PACK
17.0000 g | PACK | Freq: Every day | ORAL | Status: DC | PRN
  Administered 2019-12-15 – 2019-12-25 (×5): 17 g via ORAL
  Filled 2019-12-15 (×4): qty 1

## 2019-12-15 MED ORDER — FUROSEMIDE 40 MG PO TABS
40.0000 mg | ORAL_TABLET | Freq: Every day | ORAL | Status: DC
  Administered 2019-12-15 – 2019-12-19 (×5): 40 mg via ORAL
  Filled 2019-12-15 (×5): qty 1

## 2019-12-15 MED ORDER — VITAMIN B-12 100 MCG PO TABS
100.0000 ug | ORAL_TABLET | Freq: Every day | ORAL | Status: DC
  Administered 2019-12-16 – 2020-01-05 (×21): 100 ug via ORAL
  Filled 2019-12-15 (×22): qty 1

## 2019-12-15 MED ORDER — SENNOSIDES-DOCUSATE SODIUM 8.6-50 MG PO TABS
1.0000 | ORAL_TABLET | Freq: Two times a day (BID) | ORAL | Status: DC
  Administered 2019-12-15 – 2019-12-31 (×32): 1 via ORAL
  Filled 2019-12-15 (×33): qty 1

## 2019-12-15 MED ORDER — MYCOPHENOLATE SODIUM 180 MG PO TBEC
180.0000 mg | DELAYED_RELEASE_TABLET | Freq: Two times a day (BID) | ORAL | Status: DC
  Administered 2019-12-15 – 2020-01-05 (×42): 180 mg via ORAL
  Filled 2019-12-15 (×43): qty 1

## 2019-12-15 MED ORDER — METHOCARBAMOL 500 MG PO TABS
500.0000 mg | ORAL_TABLET | Freq: Four times a day (QID) | ORAL | Status: DC | PRN
  Administered 2019-12-15 – 2019-12-19 (×6): 500 mg via ORAL
  Filled 2019-12-15 (×6): qty 1

## 2019-12-15 MED ORDER — MOMETASONE FURO-FORMOTEROL FUM 200-5 MCG/ACT IN AERO
2.0000 | INHALATION_SPRAY | Freq: Two times a day (BID) | RESPIRATORY_TRACT | Status: DC
  Administered 2019-12-16 – 2020-01-05 (×40): 2 via RESPIRATORY_TRACT
  Filled 2019-12-15: qty 8.8

## 2019-12-15 MED ORDER — ALBUTEROL SULFATE (2.5 MG/3ML) 0.083% IN NEBU: 2.5000 mg | INHALATION_SOLUTION | RESPIRATORY_TRACT | Status: DC | PRN

## 2019-12-15 MED ORDER — ONDANSETRON HCL 4 MG PO TABS: 4.0000 mg | ORAL_TABLET | Freq: Four times a day (QID) | ORAL | Status: DC | PRN

## 2019-12-15 MED ORDER — CYCLOSPORINE 0.05 % OP EMUL
1.0000 [drp] | Freq: Two times a day (BID) | OPHTHALMIC | Status: DC
  Administered 2019-12-15 – 2020-01-05 (×42): 1 [drp] via OPHTHALMIC
  Filled 2019-12-15 (×43): qty 1

## 2019-12-15 MED ORDER — ONDANSETRON HCL 4 MG/2ML IJ SOLN: 4.0000 mg | Freq: Four times a day (QID) | INTRAMUSCULAR | Status: DC | PRN

## 2019-12-15 MED ORDER — ACETAMINOPHEN 325 MG PO TABS: 325.0000 mg | ORAL_TABLET | Freq: Four times a day (QID) | ORAL | Status: DC | PRN

## 2019-12-15 NOTE — Progress Notes (Addendum)
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  X-ray imaging showed nondisplaced, noncomminuted fracture of the left mid femoral neck with mid to valgus angulation, comminuted fracture of the distal left radius.  Orthopedics consulted.  Status post left total hip arthroplasty and ORIF of left wrist fracture, repair of laceration of left forearm by orthopedics on 12/08/2019.  PT/OT evaluation done and recommended inpatient rehab.    She is medically stable for discharge to inpatient rehab today.  Assessment & Plan:   Principal Problem:   Closed left hip fracture (HCC) Active Problems:   End stage renal disease (HCC)   Asthma, mild intermittent   GERD (gastroesophageal reflux disease)   Renal transplant recipient   Hyperlipidemia   Chronic diastolic CHF (congestive heart failure) (HCC)   Fracture   Left wrist fracture  Closed left hip fracture after mechanical fall: X-ray finding as above.  Status post left total hip arthroplasty by orthopedics on 12/08/2019. Started on aspirin 325 mg daily  for DVT prophylaxis .PT/OT recommended CIR.  Left wrist fracture:Currently in sling. S/P ORIF of left wrist fracture, repair of laceration of left forearm by orthopedics on 12/08/2019.  Acute blood loss anemia:Secondary to surgery. Status post 2 units of blood transfusion on 12/11/19.Hemoglobin in the range of 9.Check CBC in a week.  Leukocytosis: Improved.   GQQ:[id number]. Kidney function at baseline.  Polycystic kidney disease, end-stage renal disease status post renal transplant: On prednisone, Prograf, tacrolimus, mycophenolate, Lasix at home. Follows with Dr. Jimmy Footman  Mild intermittent  asthma: Currently stable. Not wheezing. On inhalers at home. She also had mild hypoxia on room air and requiring 2 L of oxygen per minute. Likely secondary to atelectasis.Continue incentive spirometer.  Continue supplemental oxygen as needed.  GERD: Continue PPI  Chronic diastolic OIZ:[id number] euvolemic. On Lasix at home.lasix on hold due to AKI.Resume on discharge.         DVT prophylaxis:SCD, aspirin Code Status: Full code Family Communication: Discussed with husband on phone on 12/10/19 Disposition Plan: Patient is from home.  She will be discharged to CIR as per PT recommendation.   Consultants: Orthopedics  Procedures: None  Antimicrobials:  Anti-infectives (From admission, onward)   Start     Dose/Rate Route Frequency Ordered Stop   12/08/19 2200  ceFAZolin (ANCEF) IVPB 2g/100 mL premix     2 g 200 mL/hr over 30 Minutes Intravenous Every 6 hours 12/08/19 2053 12/09/19 0559   12/08/19 0600  ceFAZolin (ANCEF) IVPB 2g/100 mL premix     2 g 200 mL/hr over 30 Minutes Intravenous On call to O.R. 12/07/19 2132 12/08/19 1412      Subjective:  Patient seen and examined at the bedside this morning.  Looks much better today.  Not in any kind of distress.  Continues to complain of some pain but better controlled today.  Stable for discharge today, she has a bed in CIR   objective: Vitals:   12/14/19 1942 12/14/19 2125 12/15/19 0539 12/15/19 0852  BP:  (!) 154/64 131/68   Pulse:  (!) 106 96   Resp:  20 20   Temp:  98.5 F (36.9 C) 97.8 F (36.6 C)   TempSrc:  Oral Oral   SpO2: 91% 100% 92% 91%  Weight:  Height:        Intake/Output Summary (Last 24 hours) at 12/15/2019 1033 Last data filed at 12/15/2019 7829 Gross per 24 hour  Intake 760 ml  Output 1850 ml  Net -1090 ml   Filed Weights   12/07/19 1304  Weight: 79.4 kg    Examination:   General exam: Not in distress  Respiratory system: No wheezes or crackles, decreased air entry on the  bases Cardiovascular system: S1 & S2 heard, RRR Gastrointestinal system: Abdomen is nondistended, soft and nontender Central nervous system: Alert and oriented. No focal neurological deficits. Extremities: Sling on the left arm, clean surgical on the left hip. Skin: No rashes, lesions or ulcers,no icterus ,no pallor   Data Reviewed: I have personally reviewed following labs and imaging studies  CBC: Recent Labs  Lab 12/11/19 0345 12/12/19 0413 12/13/19 0430 12/14/19 0416 12/15/19 0353  WBC 9.3 8.4 9.3 11.5* 11.9*  NEUTROABS 7.8* 6.8 7.5 9.4* 9.8*  HGB 7.6* 9.3* 9.4* 9.8* 9.8*  HCT 24.7* 29.8* 30.6* 32.6* 32.6*  MCV 97.2 93.7 94.7 96.2 96.4  PLT 109* 110* 120* 145* 562   Basic Metabolic Panel: Recent Labs  Lab 12/08/19 1736 12/09/19 0457 12/10/19 0434 12/11/19 0345 12/12/19 0823  NA 138 134* 133* 138 136  K 4.9 4.5 4.4 4.7 4.6  CL 104 103 100 106 102  CO2  --  23 26 28 29   GLUCOSE 225* 258* 150* 153* 108*  BUN 28* 35* 38* 29* 21  CREATININE 1.20* 1.42* 1.46* 1.31* 0.91  CALCIUM  --  8.1* 8.6* 8.6* 8.7*   GFR: Estimated Creatinine Clearance: 61.1 mL/min (by C-G formula based on SCr of 0.91 mg/dL). Liver Function Tests: No results for input(s): AST, ALT, ALKPHOS, BILITOT, PROT, ALBUMIN in the last 168 hours. No results for input(s): LIPASE, AMYLASE in the last 168 hours. No results for input(s): AMMONIA in the last 168 hours. Coagulation Profile: No results for input(s): INR, PROTIME in the last 168 hours. Cardiac Enzymes: No results for input(s): CKTOTAL, CKMB, CKMBINDEX, TROPONINI in the last 168 hours. BNP (last 3 results) No results for input(s): PROBNP in the last 8760 hours. HbA1C: No results for input(s): HGBA1C in the last 72 hours. CBG: No results for input(s): GLUCAP in the last 168 hours. Lipid Profile: No results for input(s): CHOL, HDL, LDLCALC, TRIG, CHOLHDL, LDLDIRECT in the last 72 hours. Thyroid Function Tests: No results for input(s): TSH,  T4TOTAL, FREET4, T3FREE, THYROIDAB in the last 72 hours. Anemia Panel: No results for input(s): VITAMINB12, FOLATE, FERRITIN, TIBC, IRON, RETICCTPCT in the last 72 hours. Sepsis Labs: No results for input(s): PROCALCITON, LATICACIDVEN in the last 168 hours.  Recent Results (from the past 240 hour(s))  Respiratory Panel by RT PCR (Flu A&B, Covid) - Nasopharyngeal Swab     Status: None   Collection Time: 12/07/19  4:33 PM   Specimen: Nasopharyngeal Swab  Result Value Ref Range Status   SARS Coronavirus 2 by RT PCR NEGATIVE NEGATIVE Final    Comment: (NOTE) SARS-CoV-2 target nucleic acids are NOT DETECTED. The SARS-CoV-2 RNA is generally detectable in upper respiratoy specimens during the acute phase of infection. The lowest concentration of SARS-CoV-2 viral copies this assay can detect is 131 copies/mL. A negative result does not preclude SARS-Cov-2 infection and should not be used as the sole basis for treatment or other patient management decisions. A negative result may occur with  improper specimen collection/handling, submission of specimen other than nasopharyngeal swab, presence of viral mutation(s) within  the areas targeted by this assay, and inadequate number of viral copies (<131 copies/mL). A negative result must be combined with clinical observations, patient history, and epidemiological information. The expected result is Negative. Fact Sheet for Patients:  PinkCheek.be Fact Sheet for Healthcare Providers:  GravelBags.it This test is not yet ap proved or cleared by the [place] FDA and  has been authorized for detection and/or diagnosis of SARS-CoV-2 by FDA under an Emergency Use Authorization (EUA). This EUA will remain  in effect (meaning this test can be used) for the duration of the COVID-19 declaration under Section 564(b)(1) of the Act, 21 U.S.C. section 360bbb-3(b)(1), unless the authorization is  terminated or revoked sooner.    Influenza A by PCR NEGATIVE NEGATIVE Final   Influenza B by PCR NEGATIVE NEGATIVE Final    Comment: (NOTE) The Xpert Xpress SARS-CoV-2/FLU/RSV assay is intended as an aid in  the diagnosis of influenza from Nasopharyngeal swab specimens and  should not be used as a sole basis for treatment. Nasal washings and  aspirates are unacceptable for Xpert Xpress SARS-CoV-2/FLU/RSV  testing. Fact Sheet for Patients: PinkCheek.be Fact Sheet for Healthcare Providers: GravelBags.it This test is not yet approved or cleared by the [place] FDA and  has been authorized for detection and/or diagnosis of SARS-CoV-2 by  FDA under an Emergency Use Authorization (EUA). This EUA will remain  in effect (meaning this test can be used) for the duration of the  Covid-19 declaration under Section 564(b)(1) of the Act, 21  U.S.C. section 360bbb-3(b)(1), unless the authorization is  terminated or revoked. Performed at [facility], [place] [address], [place], [place] [address]   MRSA PCR Screening     Status: None   Collection Time: 12/07/19  9:31 PM   Specimen: Nasal Mucosa; Nasopharyngeal  Result Value Ref Range Status   MRSA by PCR NEGATIVE NEGATIVE Final    Comment:        The GeneXpert MRSA Assay (FDA approved for NASAL specimens only), is one component of a comprehensive MRSA colonization surveillance program. It is not intended to diagnose MRSA infection nor to guide or monitor treatment for MRSA infections. Performed at [facility], [place] [address] [place], [place] [address]          Radiology Studies: No results found.      Scheduled Meds: . allopurinol  300 mg Oral Daily  . aspirin EC  325 mg Oral Q breakfast  . brimonidine  1 drop Left Eye BID  . Chlorhexidine Gluconate Cloth  6 each Topical Daily  . cholecalciferol  1,000 Units Oral Daily  .  cycloSPORINE  1 drop Both Eyes BID  . docusate sodium  100 mg Oral BID  . dorzolamide  1 drop Both Eyes Daily  . FLUoxetine  40 mg Oral Daily  . latanoprost  1 drop Both Eyes QHS  . mometasone-formoterol  2 puff Inhalation BID  . mycophenolate  180 mg Oral BID  . pantoprazole  40 mg Oral Daily  . predniSONE  7.5 mg Oral Q breakfast  . tacrolimus  0.5 mg Oral QHS  . tacrolimus  1 mg Oral Daily  . vitamin B-12  100 mcg Oral Daily   Continuous Infusions: . methocarbamol (ROBAXIN) IV       LOS: 8 days    Time spent: 25 min.More than 50% of that time was spent in counseling and/or coordination of care.      Shelly Coss, MD [facility] Pager  [phone]  If 7PM-7AM, please contact night-coverage [email or web address] Password [facility] 12/15/2019, 10:33 AM

## 2019-12-15 NOTE — Progress Notes (Signed)
Pt admitted to 4W09. Pt alert and oriented and vitals are stable. Pt complains of mild pain and was treated with prn medications. Pt was oriented to unit and safety precautions. Pt is in room with call bell in reach ordering dinner. Will continue to monitor.

## 2019-12-15 NOTE — Progress Notes (Signed)
April Arn, MD  Physician  Physical Medicine and Rehabilitation  PMR Pre-admission  Signed  Date of Service:  12/12/2019  4:08 PM      Related encounter: ED to Hosp-Admission (Discharged) from 12/07/2019 in [facility]      Signed        PMR Admission Coordinator Pre-Admission Assessment   Patient: April [name] is an 64 y.o., female MRN: [id number] DOB: 1956/10/28 Height: 5' 1"  (154.9 cm) Weight: 79.4 kg   Insurance Information HMO:     PPO: yes     PCP:      IPA:      80/20:      OTHER:  PRIMARY: U[employer] Medicare      Policy#: [id number]      Subscriber: Patient CM Name: April [name]      Phone#: [phone]     Fax#: [phone] Pre-Cert#: [id number]      Employer:  April [name] provided by April [name] from April [name] for admit to CIR. Pt is approved for 7 days with initial start date of 1/19 with updates due 1/25. Follow up clinicals are to (f): [phone] with heading "continued stay." Benefits:  Phone #: online     Name: [email or web address] Eff. Date: 11/24/19 - present     Deduct: $200 ($0 met)      Out of Pocket Max: $2,200 (includes deductible - $0 met)      Life Max:  CIR: after $200 deductible is met, then 100% coverage, 0% co-insurance      SNF: after $200 deductible met, then 100% coverage, 0% co-insurance; limited to 100 days/cal yr  Outpatient: after $200 deductible met, then 100% coverage, 0% co-insurance; limited by medical necessity      Home Health: after $200 deductible met, then 100% coverage, 0% co-insurance; limited by medical necessity (no more than 8 hrs/d and 35 hrs/wk)      DME: after $200 deductible met, then 100% coverage, 0% co-insurance    Providers:  SECONDARY: None       Policy#:       Subscriber:  CM Name:       Phone#:      Fax#:  Pre-Cert#:       Employer:  Benefits:  Phone #:     Name: Eff. Date:      Deduct:      Out of Pocket Max:       Life Max: CIR:       SNF: Outpatient:      Co-Pay:  Home Health:       Co-Pay:  DME:       Co-Pay:    Medicaid Application Date:       Case Manager:  Disability Application Date:       Case Worker:    The "Data Collection Information Summary" for patients in Inpatient Rehabilitation Facilities with attached "Privacy Act [place] Records" was provided and verbally reviewed with: Patient   Emergency Contact Information         Contact Information     Name Relation Home Work Mobile    April [name] Spouse [phone]         Current Medical History  Patient Admitting Diagnosis: multi-ortho    History of Present Illness: April [name] is a 64 year old female with history of polycystic kidney disease, chronic diastolic congestive heart failure, end-stage renal disease post renal transplant 06/02/2012 with baseline creatinine 1.02-1.29 maintained on Prograf, chronic prednisone as well as Myfortic followed by renal service  Dr. Jimmy Footman, asthma, remote tobacco abuse 21 years ago, hyperlipidemia. Presented 12/07/2019 after mechanical fall in her kitchen landing on her left side without loss of consciousness.  X-rays and imaging revealed left side displaced femoral neck fracture as well as severely comminuted distal radius fracture on the left.  Underwent left total hip replacement anterior approach as well as ORIF left distal radius fracture 12/08/2019 per Dr. Berenice Primas as well as repair of forearm laceration.  Hospital course pain management.  Weightbearing as tolerated through elbow only of left upper extremity weightbearing as tolerated left lower extremity.  No left hip abduction.  Acute blood loss anemia status post 2 units blood transfusion 12/11/2019 with latest hemoglobin 9.8.  Maintained on aspirin 325 mg daily for DVT prophylaxis.  Her renal function remained stable with latest creatinine 0.91.  Therapy evaluations completed and patient is to be admitted for a comprehensive rehab program on 12/15/19   Patient's medical record from [facility] has been reviewed by the rehabilitation admission coordinator and physician.   Past Medical History      Past Medical History:  Diagnosis Date  . Arthritis    . Asthma    . Depression    . GERD (gastroesophageal reflux disease)    . Hyperlipidemia    . Hyperparathyroidism    . Polycystic kidney    . PONV (postoperative nausea and vomiting)        Family History   family history includes Heart disease in her father; Hypertension in her mother; Polycystic kidney disease in her brother and father.   Prior Rehab/Hospitalizations Has the patient had prior rehab or hospitalizations prior to admission? Yes   Has the patient had major surgery during 100 days prior to admission? Yes              Current Medications   Current Facility-Administered Medications:  .  acetaminophen (TYLENOL) tablet 325-650 mg, 325-650 mg, Oral, Q6H PRN, Gary Fleet, PA-C, 650 mg at 12/13/19 0105 .  albuterol (VENTOLIN HFA) 108 (90 Base) MCG/ACT inhaler 2 puff, 2 puff, Inhalation, Q4H PRN, Gary Fleet, PA-C .  allopurinol (ZYLOPRIM) tablet 300 mg, 300 mg, Oral, Daily, Gary Fleet, PA-C, 300 mg at 12/15/19 0807 .  alum & mag hydroxide-simeth (MAALOX/MYLANTA) 200-200-20 MG/5ML suspension 30 mL, 30 mL, Oral, Q4H PRN, Gary Fleet, PA-C .  aspirin EC tablet 325 mg, 325 mg, Oral, Q breakfast, Gary Fleet, PA-C, 325 mg at 12/15/19 0805 .  brimonidine (ALPHAGAN) 0.15 % ophthalmic solution 1 drop, 1 drop, Left Eye, BID, Gary Fleet, PA-C, 1 drop at 12/15/19 0817 .  Chlorhexidine Gluconate Cloth 2 % PADS 6 each, 6 each, Topical, Daily, Shelly Coss, MD, 6 each at 12/12/19 1044 .  cholecalciferol (VITAMIN D3) tablet 1,000 Units, 1,000 Units, Oral, Daily, Gary Fleet, PA-C, 1,000 Units at 12/15/19 0806 .  cycloSPORINE (RESTASIS) 0.05 % ophthalmic emulsion 1 drop, 1 drop, Both Eyes, BID, Gary Fleet, PA-C, 1 drop at 12/15/19 0810 .  docusate sodium (COLACE) capsule 100 mg, 100 mg, Oral,  BID, Gary Fleet, PA-C, 100 mg at 12/15/19 0805 .  dorzolamide (TRUSOPT) 2 % ophthalmic solution 1 drop, 1 drop, Both Eyes, Daily, Gary Fleet, PA-C, 1 drop at 12/15/19 0811 .  FLUoxetine (PROZAC) capsule 40 mg, 40 mg, Oral, Daily, Gary Fleet, PA-C, 40 mg at 12/15/19 3329 .  HYDROcodone-acetaminophen (NORCO/VICODIN) 5-325 MG per tablet 1-2 tablet, 1-2 tablet, Oral, Q4H PRN, Gary Fleet, PA-C, 2 tablet at 12/15/19 0804 .  latanoprost (XALATAN) 0.005 %  ophthalmic solution 1 drop, 1 drop, Both Eyes, QHS, Gary Fleet, PA-C, 1 drop at 12/14/19 2143 .  methocarbamol (ROBAXIN) tablet 500 mg, 500 mg, Oral, Q6H PRN, 500 mg at 12/14/19 2142 **OR** methocarbamol (ROBAXIN) 500 mg in dextrose 5 % 50 mL IVPB, 500 mg, Intravenous, Q6H PRN, [place], James, PA-C .  mometasone-formoterol (DULERA) 200-5 MCG/ACT inhaler 2 puff, 2 puff, Inhalation, BID, Gary Fleet, PA-C, 2 puff at 12/15/19 [phone] .  morphine 2 MG/ML injection 0.5-1 mg, 0.5-1 mg, Intravenous, Q2H PRN, Gary Fleet, PA-C, 1 mg at 12/08/19 2310 .  mycophenolate (MYFORTIC) EC tablet 180 mg, 180 mg, Oral, BID, Gary Fleet, PA-C, 180 mg at 12/15/19 4332 .  ondansetron (ZOFRAN) tablet 4 mg, 4 mg, Oral, Q6H PRN **OR** ondansetron (ZOFRAN) injection 4 mg, 4 mg, Intravenous, Q6H PRN, [place]une, James, PA-C .  pantoprazole (PROTONIX) EC tablet 40 mg, 40 mg, Oral, Daily, Gary Fleet, PA-C, 40 mg at 12/15/19 0806 .  polyethylene glycol (MIRALAX / GLYCOLAX) packet 17 g, 17 g, Oral, Daily PRN, Gary Fleet, PA-C, 17 g at 12/13/19 2152 .  predniSONE (DELTASONE) tablet 7.5 mg, 7.5 mg, Oral, Q breakfast, Gary Fleet, PA-C, 7.5 mg at 12/15/19 0806 .  tacrolimus (PROGRAF) capsule 0.5 mg, 0.5 mg, Oral, QHS, Gary Fleet, PA-C, 0.5 mg at 12/14/19 2144 .  tacrolimus (PROGRAF) capsule 1 mg, 1 mg, Oral, Daily, Gary Fleet, PA-C, 1 mg at 12/15/19 9518 .  vitamin B-12 (CYANOCOBALAMIN) tablet 100 mcg, 100 mcg, Oral, Daily, Gary Fleet, PA-C, 100  mcg at 12/15/19 8416   Patients Current Diet:     Diet Order                 Diet - low sodium heart healthy           Diet regular Room service appropriate? Yes; Fluid consistency: Thin  Diet effective now                   Precautions / Restrictions Precautions Precautions: Fall Restrictions Weight Bearing Restrictions: Yes LUE Weight Bearing: Weight bear through elbow only LLE Weight Bearing: Weight bearing as tolerated Other Position/Activity Restrictions: no L hip active abduction    Has the patient had 2 or more falls or a fall with injury in the past year? Yes   Prior Activity Level Limited Community (1-2x/wk): not working or driving since last fall, recent kidney transplant so trying to avoid out of house activities as well   Prior Functional Level Self Care: Did the patient need help bathing, dressing, using the toilet or eating? Independent   Indoor Mobility: Did the patient need assistance with walking from room to room (with or without device)? Independent   Stairs: Did the patient need assistance with internal or external stairs (with or without device)? Needed some help   Functional Cognition: Did the patient need help planning regular tasks such as shopping or remembering to take medications? [place] / Equipment Home Assistive Devices/Equipment: Bedside commode/3-in-1, Eyeglasses, Shower chair with back, Environmental consultant (specify type), Nebulizer(4 wheeled walker) Home Equipment: Environmental consultant - 2 wheels, Wheelchair - manual, Bedside commode, Shower seat, Adaptive equipment   Prior Device Use: Indicate devices/aids used by the patient prior to current illness, exacerbation or injury? Walker   Current Functional Level Cognition   Overall Cognitive Status: Within Functional Limits for tasks assessed Orientation Level: Oriented X4    Extremity Assessment (includes Sensation/Coordination)   Upper Extremity Assessment: LUE deficits/detail LUE  Deficits / Details: splinted  from forearm to MPs, full shoulder ROM, edematous fingers with bruising LUE: Unable to fully assess due to immobilization LUE Coordination: decreased fine motor  Lower Extremity Assessment: Defer to PT evaluation     ADLs   Overall ADL's : Needs assistance/impaired Eating/Feeding: Minimal assistance, Sitting Grooming: Oral care, Minimal assistance, Sitting Grooming Details (indicate cue type and reason): minA for bimanual tasks;completed oral care sitting EOB Upper Body Bathing: Moderate assistance, Sitting Lower Body Bathing: Total assistance, +2 for physical assistance, Sit to/from stand Upper Body Dressing : Moderate assistance, Sitting Upper Body Dressing Details (indicate cue type and reason): front opening gown Lower Body Dressing: Total assistance, Sit to/from stand, +2 for physical assistance Toilet Transfer: Moderate assistance, +2 for physical assistance, +2 for safety/equipment, RW Toilet Transfer Details (indicate cue type and reason): sit<>stand with lateral side stepping alongside EOB Toileting- Clothing Manipulation and Hygiene: +2 for physical assistance, Total assistance, Sit to/from stand Functional mobility during ADLs: Rolling walker, +2 for physical assistance, +2 for safety/equipment, Moderate assistance General ADL Comments: sat EOB for adl.  Pt requested pain medication at beginning of session as movement to EOB increased pain from 4-6.  Pain was decreased back to baseline at end of session, but repositioning spiked up to 8.  Pt reports pressure at L pelvis. Did not want to reposition off hip at end of session due to just having pain level decrease.  In supine, she is unable to reach peri area.  She is familar with AE, but she cannot yet move LLE on her own. She would benefit from reacher for bathing at this time.  (Pt is R dominant)     Mobility   Overal bed mobility: Needs Assistance Bed Mobility: Sit to Supine Supine to sit: Mod assist,  +2 for physical assistance Sit to supine: Max assist General bed mobility comments: assisted B LE at same time while keeping knees flexed as pt was able to sidely onto right then carefully roll onto back to avoid L hip active ABD.  Positioned with multiple pillows to elevate L UE and position L LE to comfort.     Transfers   Overall transfer level: Needs assistance Equipment used: None Transfers: Stand Pivot Transfers Sit to Stand: +2 physical assistance, Mod assist, +2 safety/equipment Stand pivot transfers: Mod assist General transfer comment: assisted back to bed 1/4 pivot from recliner to bed without walker such that pt could push off with R UE then transfer hand to bed rail to sure self in partial stance as therapist assist on L side.  Feeling more secure holding to "something solid" pt was able to turn and pivot with increased time.  Assisted with controlled desend onto bed.     Ambulation / Gait / Stairs / Wheelchair Mobility   Ambulation/Gait Ambulation/Gait assistance: Mod assist, +2 physical assistance, +2 safety/equipment Gait Distance (Feet): 1 Feet Assistive device: Left platform walker Gait Pattern/deviations: Step-to pattern, Decreased step length - right, Decreased step length - left, Shuffle, Trunk flexed General Gait Details: performed transfer only this afternnoon due to fatigue.  Pt was in recliner approx 5.5 hours and comfortable. Gait velocity: decreased     Posture / Balance Balance Overall balance assessment: Needs assistance Sitting-balance support: Feet supported, No upper extremity supported Sitting balance-Leahy Scale: Good Standing balance support: Bilateral upper extremity supported Standing balance-Leahy Scale: Poor     Special needs/care consideration BiPAP/CPAP : no CPM : no Continuous Drip IV : no Dialysis : no  Days : no Life Vest : no Oxygen : on RA Special Bed : no Trach Size : no Wound Vac (area)  :no      Location : no Skin: ecchymosis  to hand, arm (right;left), skin tear to right arm, surgical incision to left hip and surgical incision to left wrist                             Bowel mgmt: last BM 12/06/19, continence Bladder mgmt: incontinence, urinary catheter in place Diabetic mgmt: no Behavioral consideration : no Chemo/radiation : no    Previous Home Environment (from acute therapy documentation) Living Arrangements: Spouse/significant other, Children, Parent Available Help at Discharge: Family, Available 24 hours/day Type of Home: House Home Layout: Two level, Able to live on main level with bedroom/bathroom Alternate Level Stairs-Number of Steps: 5 Home Access: Ramped entrance Bathroom Shower/Tub: Multimedia programmer: Standard Home Care Services: No Additional Comments: Pt lives with family but all members are restricted in ability to assist pt   Discharge Living Setting Plans for Discharge Living Setting: Patient's home, Lives with (comment)(husband and >49 yo son) Type of Home at Discharge: House Discharge Home Layout: Two level, Able to live on main level with bedroom/bathroom Alternate Level Stairs-Rails: None Alternate Level Stairs-Number of Steps: NA Discharge Home Access: Ramped entrance Discharge Bathroom Shower/Tub: Walk-in shower Discharge Bathroom Toilet: Standard Discharge Bathroom Accessibility: Yes How Accessible: Accessible via walker Does the patient have any problems obtaining your medications?: No   Social/Family/Support Systems Patient Roles: Spouse Contact Information: husband: Edison Nasuti (home: [phone]) (cell: [phone]) Anticipated Caregiver: husband (who had recent hip replacement but is Independent with a cane currently, and son (45 yo) who is attending college online. Husband will not go back to work until March 3rd so will be home Anticipated Caregiver's Contact Information: see above Ability/Limitations of Caregiver: supervision/Min G Caregiver Availability:  24/7 Discharge Plan Discussed with Primary Caregiver: Yes(pt and husband) Is Caregiver In Agreement with Plan?: Yes Does Caregiver/Family have Issues with Lodging/Transportation while Pt is in Rehab?: No   Goals/Additional Needs Patient/Family Goal for Rehab: PT/OT: Supervision SLP: NA Expected length of stay: 10-14 days  Cultural Considerations: TBD Dietary Needs: regular diet, thin liquids Equipment Needs: TBD Additional Information: currently using [place] with acute therapy Pt/Family Agrees to Admission and willing to participate: Yes Program Orientation Provided & Reviewed with Pt/Caregiver Including Roles  & Responsibilities: Yes(pt and her husband)  Barriers to Discharge: Lack of/limited family support, Weight bearing restrictions(husband recently had a hip replacement, son can help)   Decrease burden of Care through IP rehab admission: NA   Possible need for SNF placement upon discharge: Not anticipated. Pt has good support from her family at DC and has an accessible home entryway. Anticipate pt will progress quickly and return home at a level where she can be managed by her family.    Patient Condition: I have reviewed medical records from [facility], spoken with RN and MD, and patient and spouse. I discussed via phone for inpatient rehabilitation assessment.  Patient will benefit from ongoing PT and OT, can actively participate in 3 hours of therapy a day 5 days of the week, and can make measurable gains during the admission.  Patient will also benefit from the coordinated team approach during an Inpatient Acute Rehabilitation admission.  The patient will receive intensive therapy as well as Rehabilitation physician, nursing, social worker, and care management interventions.  Due to  safety, skin/wound care, disease management, medication administration, pain management and patient education the patient requires 24 hour a day rehabilitation nursing.  The patient is currently Mod A  + 2 with mobility and Min/Mod A for basic ADLs.  Discharge setting and therapy post discharge at home with home health is anticipated.  Patient has agreed to participate in the Acute Inpatient Rehabilitation Program and will admit 12/15/19.   Preadmission Screen Completed By:  Raechel Ache, 12/15/2019 10:59 AM ______________________________________________________________________   Discussed status with Dr. Posey Pronto on 12/15/19 at 10:57AM and received approval for admission today.   Admission Coordinator:  Raechel Ache, OT, time 10:57AM/Date 12/15/19    Assessment/Plan: Diagnosis: multi-ortho   1. Does the need for close, 24 hr/day Medical supervision in concert with the patient's rehab needs make it unreasonable for this patient to be served in a less intensive setting? Yes  2. Co-Morbidities requiring supervision/potential complications: polycystic kidney disease, chronic diastolic congestive heart failure, end-stage renal disease post renal transplant, asthma, hyperlipidemia 3. Due to bowel management, safety, skin/wound care, disease management, pain management and patient education, does the patient require 24 hr/day rehab nursing? Yes 4. Does the patient require coordinated care of a physician, rehab nurse, PT, OT, to address physical and functional deficits in the context of the above medical diagnosis(es)? Yes Addressing deficits in the following areas: balance, endurance, locomotion, strength, transferring, bathing, dressing, toileting and psychosocial support 5. Can the patient actively participate in an intensive therapy program of at least 3 hrs of therapy 5 days a week? Yes 6. The potential for patient to make measurable gains while on inpatient rehab is excellent 7. Anticipated functional outcomes upon discharge from inpatient rehab: min assist PT, min assist OT, n/a SLP 8. Estimated rehab length of stay to reach the above functional goals is: 13-17 days. 9. Anticipated discharge  destination: Home 10. Overall Rehab/Functional Prognosis: excellent     MD Signature: Delice Lesch, MD, ABPMR        Revision History Date/Time User Provider Type Action  12/15/2019 11:41 AM April Arn, MD Physician Sign  12/15/2019 10:59 AM Raechel Ache, OT Rehab Admission Coordinator Share   View Details Report

## 2019-12-15 NOTE — Progress Notes (Signed)
Inpatient Rehabilitation-Admissions Coordinator   I have received medical approval for admit to CIR today. I have a bed available and can accept this patient today. Pt is aware and we have reviewed consent forms and insurance benefits letter. RN aware and TOC team notified.   We should have a ready bed by 4:30PM and AC will arrange transport through [facility].   Please call if questions.   Raechel Ache, OTR/L  Rehab Admissions Coordinator  [phone] 12/15/2019 10:44 AM

## 2019-12-15 NOTE — Plan of Care (Signed)
  Problem: Education: Goal: Knowledge of General Education information will improve Description: Including pain rating scale, medication(s)/side effects and non-pharmacologic comfort measures Outcome: Progressing   Problem: Health Behavior/Discharge Planning: Goal: Ability to manage health-related needs will improve Outcome: Progressing   Problem: Clinical Measurements: Goal: Ability to maintain clinical measurements within normal limits will improve Outcome: Progressing Goal: Will remain free from infection Outcome: Progressing Goal: Diagnostic test results will improve Outcome: Progressing Goal: Respiratory complications will improve Outcome: Progressing Goal: Cardiovascular complication will be avoided Outcome: Progressing   Problem: Activity: Goal: Risk for activity intolerance will decrease Outcome: Progressing   Problem: Nutrition: Goal: Adequate nutrition will be maintained Outcome: Progressing   Problem: Coping: Goal: Level of anxiety will decrease Outcome: Progressing   Problem: Elimination: Goal: Will not experience complications related to bowel motility Outcome: Progressing Goal: Will not experience complications related to urinary retention Outcome: Progressing   Problem: Pain Managment: Goal: General experience of comfort will improve Outcome: Progressing   Problem: Safety: Goal: Ability to remain free from injury will improve Outcome: Progressing   Problem: Skin Integrity: Goal: Risk for impaired skin integrity will decrease Outcome: Progressing   Problem: Education: Goal: Verbalization of understanding the information provided (i.e., activity precautions, restrictions, etc) will improve Outcome: Progressing Goal: Individualized Educational Video(s) Outcome: Progressing   Problem: Activity: Goal: Ability to ambulate and perform ADLs will improve Outcome: Progressing   Problem: Clinical Measurements: Goal: Postoperative complications will be  avoided or minimized Outcome: Progressing   Problem: Self-Concept: Goal: Ability to maintain and perform role responsibilities to the fullest extent possible will improve Outcome: Progressing   Problem: Pain Management: Goal: Pain level will decrease Outcome: Progressing   

## 2019-12-15 NOTE — H&P (Signed)
Physical Medicine and Rehabilitation Admission H&P    Chief Complaint  Patient presents with  . Fall  . Wrist Pain  . Hip Pain  : HPI: April [name] is a 64 year old right-handed female with history of polycystic kidney disease, chronic diastolic congestive heart failure, end-stage renal disease post renal transplant 06/02/2012 with baseline creatinine 1.02-1.29 maintained on Prograf, chronic prednisone as well as Myfortic followed by renal service Dr. Jimmy Footman, asthma, remote tobacco abuse 21 years ago, hyperlipidemia.  History taken from chart review and patient. Patient lives with spouse and family.  Two-level home bed and bath on main level with ramped entrance.  Independent with rolling walker prior to admission.  Presented 12/07/2019 after fall in her kitchen without LOC, landing on her left side. X-rays and imaging revealed left displaced femoral neck fracture as well as severely comminuted distal radius fracture on the left.  Underwent left total hip replacement anterior approach as well as ORIF left distal radius fracture 12/08/2019 per Dr. Berenice Primas as well as repair of forearm laceration.  Hospital course further complicated by pain. Weightbearing as tolerated through elbow only of left upper extremity weightbearing as tolerated left lower extremity.  No left hip abduction.  Acute blood loss anemia status post 2 units blood transfusion 12/11/2019 with latest hemoglobin 9.8 on 12/15/2019  Maintained on aspirin 325 mg daily for DVT prophylaxis.  Her renal function remained stable with latest creatinine 0.91.  Therapy evaluations completed and patient was admitted for a comprehensive rehab program. Please see preadmission assessment from earlier today as well.   Review of Systems  Constitutional: Negative for fever.  HENT: Negative for hearing loss.   Eyes: Negative for blurred vision and double vision.  Respiratory: Negative for cough and shortness of breath.   Cardiovascular: Negative  for chest pain and palpitations.  Gastrointestinal: Positive for constipation. Negative for heartburn, nausea and vomiting.       GERD  Genitourinary: Negative for dysuria, flank pain and hematuria.  Musculoskeletal: Positive for joint pain and myalgias.  Neurological: Positive for sensory change, focal weakness and weakness.  Psychiatric/Behavioral: Positive for depression. The patient has insomnia.    Past Medical History:  Diagnosis Date  . Arthritis   . Asthma   . Depression   . GERD (gastroesophageal reflux disease)   . Hyperlipidemia   . Hyperparathyroidism   . Polycystic kidney   . PONV (postoperative nausea and vomiting)    Past Surgical History:  Procedure Laterality Date  . 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 BILATERAL OOPHORECTOMY    . CARPAL TUNNEL RELEASE  2000  . CESAREAN SECTION    . INSERTION OF DIALYSIS CATHETER  03/31/2012   Procedure: INSERTION OF DIALYSIS CATHETER;  Surgeon: Angelia Mould, MD;  Location: [facility];  Service: Vascular;  Laterality: N/A;  insertion of dialysis catheter right internal jugular vein  . 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 ORIF TIBIA FRACTURE Left 12/23/2018   Procedure: OPEN REDUCTION INTERNAL FIXATION (ORIF) TIBIA FRACTURE;  Surgeon: Shona Needles, MD;  Location: [facility];  Service: Orthopedics;  Laterality: Left;  . ORIF TIBIA PLATEAU Right 12/23/2018   Procedure: OPEN REDUCTION INTERNAL FIXATION (ORIF) TIBIAL PLATEAU;  Surgeon: Shona Needles, MD;  Location: [facility];  Service: Orthopedics;  Laterality: Right;  . ORIF WRIST FRACTURE Left 12/08/2019   Procedure: OPEN REDUCTION INTERNAL FIXATION (ORIF) WRIST FRACTURE, REPAIR LACERATION LEFT FOREARM;  Surgeon: Dorna Leitz, MD;  Location: [facility];  Service: Orthopedics;  Laterality: Left;  . TONSILLECTOMY  1967  . TOTAL HIP ARTHROPLASTY Left 12/08/2019   Procedure: TOTAL HIP ARTHROPLASTY ANTERIOR APPROACH;  Surgeon: Dorna Leitz, MD;  Location: [facility];   Service: Orthopedics;  Laterality: Left;  . TUBAL LIGATION  2010   Family History  Problem Relation Age of Onset  . Hypertension Mother   . Heart disease Father        CABG history  . Polycystic kidney disease Father   . Polycystic kidney disease Brother    Social History:  reports that she quit smoking about 21 years ago. Her smoking use included cigarettes. She has a 3.75 pack-year smoking history. She has never used smokeless tobacco. She reports that she does not drink alcohol or use drugs. Allergies:  Allergies  Allergen Reactions  . Infed [Iron Dextran] Other (See Comments)    Chest tightness  . Pentamidine Itching, Shortness Of Breath and Swelling  . Erythromycin [Erythromycin] Other (See Comments)    Mouth Ulcers  . Oxycodone Nausea Only and Nausea And Vomiting  . Erythromycin Rash    Causes breakout in mouth  . Ultram [Tramadol Hcl] Anxiety   Medications Prior to Admission  Medication Sig Dispense Refill  . acyclovir (ZOVIRAX) 400 MG tablet Take 400 mg by mouth 2 (two) times daily as needed (outbreaks).     [name] albuterol (PROVENTIL HFA;VENTOLIN HFA) 108 (90 Base) MCG/ACT inhaler Inhale 2 puffs into the lungs every 4 (four) hours as needed for wheezing or shortness of breath. 18 g 0  . albuterol (PROVENTIL) (2.5 MG/3ML) 0.083% nebulizer solution Take 3 mLs (2.5 mg total) by nebulization 3 (three) times daily as needed for wheezing or shortness of breath. 75 mL 0  . alendronate (FOSAMAX) 70 MG tablet Take 70 mg by mouth every 7 (seven) days.    [name] allopurinol (ZYLOPRIM) 300 MG tablet Take 300 mg by mouth daily.    [name] aspirin EC 81 MG tablet Take 81 mg by mouth daily.    . brimonidine (ALPHAGAN P) 0.1 % SOLN Place 1 drop 2 (two) times daily into the left eye.    . cholecalciferol (VITAMIN D3) 25 MCG (1000 UT) tablet Take 1,000 Units by mouth daily.     . Colchicine 0.6 MG CAPS Take 0.6 mg by mouth daily as needed (gout).     . cycloSPORINE (RESTASIS) 0.05 % ophthalmic emulsion  Place 1 drop into both eyes 2 (two) times daily.    . dorzolamide (TRUSOPT) 2 % ophthalmic solution Place 1 drop into both eyes daily.    [name] FLUoxetine (PROZAC) 40 MG capsule Take 40 mg by mouth daily.    . furosemide (LASIX) 40 MG tablet Take 1 tablet by mouth daily.     . magnesium oxide (MAG-OX) 400 MG tablet Take 400 mg by mouth 2 (two) times daily.    . mycophenolate (MYFORTIC) 180 MG EC tablet Take 180 mg by mouth 2 (two) times daily.    . pantoprazole (PROTONIX) 40 MG tablet Take 40 mg by mouth daily.     . predniSONE (DELTASONE) 5 MG tablet Take 7.5 mg by mouth daily.     [name] PROGRAF 1 MG capsule Take 1 mg by mouth daily.    . SYMBICORT 160-4.5 MCG/ACT inhaler Inhale 2 puffs into the lungs 2 (two) times daily.    . tacrolimus (PROGRAF) 0.5 MG capsule Take 0.5 mg by mouth at bedtime. Takes brand name Prograf    . TRAVATAN Z 0.004 %  SOLN ophthalmic solution Place 1 drop into both eyes at bedtime.    . vitamin B-12 (CYANOCOBALAMIN) 100 MCG tablet Take 100 mcg by mouth daily.      Drug Regimen Review Drug regimen was reviewed and remains appropriate with no significant issues identified  Home: Home Living Family/patient expects to be discharged to:: Private residence Living Arrangements: Spouse/significant other, Children, Parent Available Help at Discharge: Family, Available 24 hours/day Type of Home: House Home Access: Ramped entrance Home Layout: Two level, Able to live on main level with bedroom/bathroom Alternate Level Stairs-Number of Steps: 5 Bathroom Shower/Tub: Multimedia programmer: Standard Home Equipment: Environmental consultant - 2 wheels, Wheelchair - manual, Bedside commode, Shower seat, Financial controller: Secondary school teacher, Sock aid Additional Comments: Pt lives with family but all members are restricted in ability to assist pt   Functional History: Prior Function Level of Independence: Independent with assistive device(s) Comments: ambulates with  RW  Functional Status:  Mobility: Bed Mobility Overal bed mobility: Needs Assistance Bed Mobility: Sit to Supine Supine to sit: Mod assist, +2 for physical assistance Sit to supine: Max assist General bed mobility comments: assisted B LE at same time while keeping knees flexed as pt was able to sidely onto right then carefully roll onto back to avoid L hip active ABD.  Positioned with multiple pillows to elevate L UE and position L LE to comfort. Transfers Overall transfer level: Needs assistance Equipment used: None Transfers: Stand Pivot Transfers Sit to Stand: +2 physical assistance, Mod assist, +2 safety/equipment Stand pivot transfers: Mod assist General transfer comment: assisted back to bed 1/4 pivot from recliner to bed without walker such that pt could push off with R UE then transfer hand to bed rail to sure self in partial stance as therapist assist on L side.  Feeling more secure holding to "something solid" pt was able to turn and pivot with increased time.  Assisted with controlled desend onto bed. Ambulation/Gait Ambulation/Gait assistance: Mod assist, +2 physical assistance, +2 safety/equipment Gait Distance (Feet): 1 Feet Assistive device: Left platform walker Gait Pattern/deviations: Step-to pattern, Decreased step length - right, Decreased step length - left, Shuffle, Trunk flexed General Gait Details: performed transfer only this afternnoon due to fatigue.  Pt was in recliner approx 5.5 hours and comfortable. Gait velocity: decreased    ADL: ADL Overall ADL's : Needs assistance/impaired Eating/Feeding: Minimal assistance, Sitting Grooming: Oral care, Minimal assistance, Sitting Grooming Details (indicate cue type and reason): minA for bimanual tasks;completed oral care sitting EOB Upper Body Bathing: Moderate assistance, Sitting Lower Body Bathing: Total assistance, +2 for physical assistance, Sit to/from stand Upper Body Dressing : Moderate assistance,  Sitting Upper Body Dressing Details (indicate cue type and reason): front opening gown Lower Body Dressing: Total assistance, Sit to/from stand, +2 for physical assistance Toilet Transfer: Moderate assistance, +2 for physical assistance, +2 for safety/equipment, RW Toilet Transfer Details (indicate cue type and reason): sit<>stand with lateral side stepping alongside EOB Toileting- Clothing Manipulation and Hygiene: +2 for physical assistance, Total assistance, Sit to/from stand Functional mobility during ADLs: Rolling walker, +2 for physical assistance, +2 for safety/equipment, Moderate assistance General ADL Comments: sat EOB for adl.  Pt requested pain medication at beginning of session as movement to EOB increased pain from 4-6.  Pain was decreased back to baseline at end of session, but repositioning spiked up to 8.  Pt reports pressure at L pelvis. Did not want to reposition off hip at end of session due to just having pain  level decrease.  In supine, she is unable to reach peri area.  She is familar with AE, but she cannot yet move LLE on her own. She would benefit from reacher for bathing at this time.  (Pt is R dominant)  Cognition: Cognition Overall Cognitive Status: Within Functional Limits for tasks assessed Orientation Level: Oriented X4 Cognition Arousal/Alertness: Awake/alert Behavior During Therapy: WFL for tasks assessed/performed Overall Cognitive Status: Within Functional Limits for tasks assessed  Physical Exam: Blood pressure 138/63, pulse 96, temperature 97.7 F (36.5 C), temperature source Oral, resp. rate 20, height 5\' 1"  (1.549 m), weight 79.4 kg, SpO2 99 %. Physical Exam  Vitals reviewed. Constitutional: She is oriented to person, place, and time. She appears well-developed.  Morbidly obese  HENT:  Head: Normocephalic and atraumatic.  Eyes: EOM are normal. Right eye exhibits no discharge. Left eye exhibits no discharge.  Neck: No tracheal deviation present. No  thyromegaly present.  Respiratory: Effort normal. No stridor. No respiratory distress.  GI: Soft. She exhibits distension.  Musculoskeletal:     Comments: Generalized edema  Neurological: She is alert and oriented to person, place, and time.  Patient is alert and oriented x3 Follows full commands. Motor: RUE: 4-/5 proximal to distal LLE: 3+-4-/5 proximal to distal LUE: proximally limited by splint, hand grip 2/5 LLE: 2+/5 proximal to distal Sensation diminished to light touch b/le feet  Skin:  Left hip incision with dressing clean/dry/intact.   Left upper extremity with sugar tong splint in place. Scattered ecchymosis   Psychiatric: She has a normal mood and affect. Her behavior is normal.    Results for orders placed or performed during the hospital encounter of 12/07/19 (from the past 48 hour(s))  CBC with Differential/Platelet     Status: Abnormal   Collection Time: 12/14/19  4:16 AM  Result Value Ref Range   WBC 11.5 (H) 4.0 - 10.5 K/uL   RBC 3.39 (L) 3.87 - 5.11 MIL/uL   Hemoglobin 9.8 (L) 12.0 - 15.0 g/dL   HCT 32.6 (L) 36.0 - 46.0 %   MCV 96.2 80.0 - 100.0 fL   MCH 28.9 26.0 - 34.0 pg   MCHC 30.1 30.0 - 36.0 g/dL   RDW 18.0 (H) 11.5 - 15.5 %   Platelets 145 (L) 150 - 400 K/uL   nRBC 0.0 0.0 - 0.2 %   Neutrophils Relative % 82 %   Neutro Abs 9.4 (H) 1.7 - 7.7 K/uL   Lymphocytes Relative 10 %   Lymphs Abs 1.1 0.7 - 4.0 K/uL   Monocytes Relative 5 %   Monocytes Absolute 0.6 0.1 - 1.0 K/uL   Eosinophils Relative 1 %   Eosinophils Absolute 0.1 0.0 - 0.5 K/uL   Basophils Relative 0 %   Basophils Absolute 0.0 0.0 - 0.1 K/uL   Immature Granulocytes 2 %   Abs Immature Granulocytes 0.23 (H) 0.00 - 0.07 K/uL    Comment: Performed at [facility], [place] [address] [place], [place] [address]  CBC with Differential/Platelet     Status: Abnormal   Collection Time: 12/15/19  3:53 AM  Result Value Ref Range   WBC 11.9 (H) 4.0 - 10.5 K/uL   RBC 3.38 (L) 3.87  - 5.11 MIL/uL   Hemoglobin 9.8 (L) 12.0 - 15.0 g/dL   HCT 32.6 (L) 36.0 - 46.0 %   MCV 96.4 80.0 - 100.0 fL   MCH 29.0 26.0 - 34.0 pg   MCHC 30.1 30.0 - 36.0 g/dL   RDW 17.9 (  H) 11.5 - 15.5 %   Platelets 155 150 - 400 K/uL   nRBC 0.0 0.0 - 0.2 %   Neutrophils Relative % 81 %   Neutro Abs 9.8 (H) 1.7 - 7.7 K/uL   Lymphocytes Relative 10 %   Lymphs Abs 1.2 0.7 - 4.0 K/uL   Monocytes Relative 5 %   Monocytes Absolute 0.5 0.1 - 1.0 K/uL   Eosinophils Relative 1 %   Eosinophils Absolute 0.1 0.0 - 0.5 K/uL   Basophils Relative 0 %   Basophils Absolute 0.0 0.0 - 0.1 K/uL   Immature Granulocytes 3 %   Abs Immature Granulocytes 0.32 (H) 0.00 - 0.07 K/uL    Comment: Performed at [facility], [place] [address], [place], [place] [address]   No results found.     Medical Problem List and Plan: 1.  Decreased functional mobility secondary to left displaced femoral neck fracture status post left total hip replacement anterior approach as well as severely left comminuted distal radius fracture status post ORIF 12/08/2019 as well as forearm laceration repair.  Weightbearing as tolerated through left elbow only and weightbearing as tolerated left lower extremity.  No left hip abduction  -patient may not shower  -ELOS/Goals: 16-20 days/Min/Mod A  Admit to CIR 2.  Antithrombotics: -DVT/anticoagulation: SCDs.  Dopplers ordered.  -antiplatelet therapy: Aspirin 325 mg daily 3. Pain Management: Hydrocodone and Robaxin as needed  Monitor with increased mobility. 4. Mood: Prozac 40 mg daily  -antipsychotic agents: N/A 5. Neuropsych: This patient is capable of making decisions on her own behalf. 6. Skin/Wound Care: Routine skin checks 7. Fluids/Electrolytes/Nutrition: Routine in and outs. CMP ordered. 8.  Polycystic kidney disease status post renal transplant 06/02/2012.  Baseline creatinine 1.02-1.29.  Continue Prograf, chronic prednisone as well as Myfortic.  Followed by Dr.  Jimmy Footman  CMP ordered. 9.  Acute blood loss anemia.  Transfused 2 units packed red blood cells 12/11/2019.    CBC ordered. 10.  Asthma.  Continue inhalers as directed. 11.  History of gout.  Allopurinol daily.  Monitor for any gout flareups 12.  Chronic diastolic congestive heart failure.  Lasix 40 mg daily. Monitor for sign/symptoms of fluid overload. Daily weights.  Lavon Paganini Angiulli, PA-C 12/15/2019  I have personally performed a face to face diagnostic evaluation, including, but not limited to relevant history and physical exam findings, of this patient and developed relevant assessment and plan.  Additionally, I have reviewed and concur with the physician assistant's documentation above.  Delice Lesch, MD, ABPMR  The patient's status has not changed. The original post admission physician evaluation remains appropriate, and any changes from the pre-admission screening or documentation from the acute chart are noted above.   Delice Lesch, MD, ABPMR

## 2019-12-16 ENCOUNTER — Inpatient Hospital Stay (HOSPITAL_COMMUNITY): Payer: Medicare Other

## 2019-12-16 ENCOUNTER — Inpatient Hospital Stay (HOSPITAL_COMMUNITY): Payer: Medicare Other | Admitting: Physical Therapy

## 2019-12-16 DIAGNOSIS — Q613 Polycystic kidney, unspecified: Secondary | ICD-10-CM

## 2019-12-16 DIAGNOSIS — G8918 Other acute postprocedural pain: Secondary | ICD-10-CM

## 2019-12-16 DIAGNOSIS — I5032 Chronic diastolic (congestive) heart failure: Secondary | ICD-10-CM

## 2019-12-16 DIAGNOSIS — S72002A Fracture of unspecified part of neck of left femur, initial encounter for closed fracture: Secondary | ICD-10-CM

## 2019-12-16 DIAGNOSIS — M7989 Other specified soft tissue disorders: Secondary | ICD-10-CM

## 2019-12-16 MED ORDER — MORPHINE SULFATE ER 15 MG PO TBCR
15.0000 mg | EXTENDED_RELEASE_TABLET | Freq: Two times a day (BID) | ORAL | Status: DC
  Administered 2019-12-16 – 2019-12-21 (×11): 15 mg via ORAL
  Filled 2019-12-16 (×11): qty 1

## 2019-12-16 MED ORDER — SORBITOL 70 % SOLN
60.0000 mL | Status: AC
  Administered 2019-12-16: 60 mL via ORAL
  Filled 2019-12-16: qty 60
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
## 2019-12-16 NOTE — Progress Notes (Signed)
Occupational Therapy Session Note  Patient Details  Name: April [name] MRN: [id number] Date of Birth: Apr 11, 1956  Today's Date: 12/16/2019 OT Individual Time: 1500-1600 OT Individual Time Calculation (min): 60 min    Short Term Goals: Week 1:  OT Short Term Goal 1 (Week 1): Pt will sit to stand wiht MOD A of 1 in prep for CM OT Short Term Goal 2 (Week 1): Pt will transfer to w/c or BSC with MAX A of 1 to decrease BOC OT Short Term Goal 3 (Week 1): Pt will thread 1LE into pants with AE PRN OT Short Term Goal 4 (Week 1): Pt will groom with set up  Skilled Therapeutic Interventions/Progress Updates:    1:1. Pt received in bed with 3/10 pain that is much improved since this mornings session. Pt agreeable to OT. OT threads pants on feet and pt rolls with MOD A for OT to advance past hips. Pt supine>sitting with MAX A overall for LE and trunk management. Pt doffs socks with reacher and dons with sock aide and significantly increased time. Pt requires VC for technique to hold sock aide between thighs to allow for pt to place sock onto device with both UEs. OT adjusts PFRW to appropriate height. Pt completes 1 sit to stand with MAX A from EOB and takes 4 side steps towards [facility] with MIN A. Pt placed on bed pan d/t time constraint at end of session with call light tin reach and all needs met   Therapy Documentation Precautions:  Precautions Precautions: Fall Required Braces or Orthoses: Sling Restrictions Weight Bearing Restrictions: Yes LUE Weight Bearing: Weight bear through elbow only LLE Weight Bearing: Weight bearing as tolerated Other Position/Activity Restrictions: no L hip active abduction General:   Vital Signs:  Pain: Pain Assessment Pain Score: 5  ADL: ADL Grooming: Minimal assistance Where Assessed-Grooming: Edge of bed Upper Body Bathing: Minimal assistance Where Assessed-Upper Body Bathing: Edge of bed Lower Body Bathing: Maximal assistance Where Assessed-Lower Body  Bathing: Edge of bed Upper Body Dressing: Supervision/safety Where Assessed-Upper Body Dressing: Edge of bed Lower Body Dressing: (2 helper sit to stand) Where Assessed-Lower Body Dressing: Edge of bed Vision   Perception    Praxis Praxis: Intact Exercises:   Other Treatments:     Therapy/Group: Individual Therapy  Tonny Branch 12/16/2019, 4:08 PM

## 2019-12-16 NOTE — Progress Notes (Signed)
Bilateral lower extremity venous duplex completed. Refer to "CV Proc" under chart review to view preliminary results.  12/16/2019 2:13 PM Kelby Aline., MHA, RVT, RDCS, RDMS

## 2019-12-16 NOTE — Evaluation (Signed)
Occupational Therapy Assessment and Plan  Patient Details  Name: April [name] MRN: [id number] Date of Birth: 11/10/1956  OT Diagnosis: abnormal posture, acute pain, muscle weakness (generalized), pain in joint and swelling of limb Rehab Potential:   ELOS: 2.5-3 weeks   Today's Date: 12/16/2019 OT Individual Time: 0800-0900 OT Individual Time Calculation (min): 60 min     Problem List:  Patient Active Problem List   Diagnosis Date Noted  . Left displaced femoral neck fracture ([place]) 12/15/2019  . Status post total hip replacement, left   . Post-op pain   . Acute blood loss anemia   . Polycystic kidney   . Closed left hip fracture ([place]) 12/07/2019  . Left wrist fracture 12/07/2019  . Medication management 09/22/2019  . Physical deconditioning 09/22/2019  . Hyponatremia 06/29/2019  . Acute respiratory failure with hypoxia ([place]) 04/19/2019  . Acute on chronic diastolic CHF (congestive heart failure) ([place]) 04/19/2019  . Bilateral closed proximal tibial fracture 12/21/2018  . Closed right ankle fracture 12/21/2018  . Fracture 12/21/2018  . Lower urinary tract infectious disease 10/03/2018  . Asthma, chronic, unspecified asthma severity, with acute exacerbation 10/03/2018  . SIRS (systemic inflammatory response syndrome) ([place]) 10/03/2018  . Nausea vomiting and diarrhea 10/02/2018  . Chronic diastolic CHF (congestive heart failure) ([place]) 10/01/2017  . Influenza B 10/01/2017  . Persistent asthma with status asthmaticus   . Pneumonia 07/15/2016  . Asthma, mild intermittent 07/15/2016  . GERD (gastroesophageal reflux disease) 07/15/2016  . Renal transplant recipient 07/15/2016  . Hyperlipidemia 07/15/2016  . Depression 07/15/2016  . Bronchitis, mucopurulent recurrent ([place]) 08/08/2014  . Chronic cough 07/24/2014  . End stage renal disease ([place]) 03/23/2012  . 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, implant, and graft 03/23/2012    Past Medical History:  Past Medical  History:  Diagnosis Date  . Arthritis   . Asthma   . Depression   . GERD (gastroesophageal reflux disease)   . Hyperlipidemia   . Hyperparathyroidism   . Polycystic kidney   . PONV (postoperative nausea and vomiting)    Past Surgical History:  Past Surgical History:  Procedure Laterality Date  . 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 BILATERAL OOPHORECTOMY    . CARPAL TUNNEL RELEASE  2000  . CESAREAN SECTION    . INSERTION OF DIALYSIS CATHETER  03/31/2012   Procedure: INSERTION OF DIALYSIS CATHETER;  Surgeon: Angelia Mould, MD;  Location: [facility];  Service: Vascular;  Laterality: N/A;  insertion of dialysis catheter right internal jugular vein  . 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 ORIF TIBIA FRACTURE Left 12/23/2018   Procedure: OPEN REDUCTION INTERNAL FIXATION (ORIF) TIBIA FRACTURE;  Surgeon: Shona Needles, MD;  Location: [facility];  Service: Orthopedics;  Laterality: Left;  . ORIF TIBIA PLATEAU Right 12/23/2018   Procedure: OPEN REDUCTION INTERNAL FIXATION (ORIF) TIBIAL PLATEAU;  Surgeon: Shona Needles, MD;  Location: [facility];  Service: Orthopedics;  Laterality: Right;  . ORIF WRIST FRACTURE Left 12/08/2019   Procedure: OPEN REDUCTION INTERNAL FIXATION (ORIF) WRIST FRACTURE, REPAIR LACERATION LEFT FOREARM;  Surgeon: Dorna Leitz, MD;  Location: [facility];  Service: Orthopedics;  Laterality: Left;  . TONSILLECTOMY  1967  . TOTAL HIP ARTHROPLASTY Left 12/08/2019   Procedure: TOTAL HIP ARTHROPLASTY ANTERIOR APPROACH;  Surgeon: Dorna Leitz, MD;  Location: [facility];  Service: Orthopedics;  Laterality: Left;  . TUBAL LIGATION  2010    Assessment & Plan Clinical Impression: .  Pt is a 64 year old woman admitted on 12/07/19 after falling in her kitchen and sustaining L wrist and L hip fractures. Underwent ORIF of L wrist and repair of forearm laceration and  L anterior total hip arthroplasty. PMH: arthritis, kidney transplant 2013, asthma.  Patient currently requires  max with basic self-care skills secondary to muscle weakness, decreased cardiorespiratoy endurance and decreased sitting balance, decreased standing balance, decreased postural control, decreased balance strategies and difficulty maintaining precautions.  Prior to hospitalization, patient could complete BADL/IADL with modified independent .  Patient will benefit from skilled intervention to decrease level of assist with basic self-care skills and increase independence with basic self-care skills prior to discharge home with care partner.  Anticipate patient will require 24 hour supervision and follow up home health.  OT - End of Session Activity Tolerance: Tolerates 10 - 20 min activity with multiple rests Endurance Deficit: Yes OT Assessment OT Barriers to Discharge: Decreased caregiver support;Lack of/limited family support OT Patient demonstrates impairments in the following area(s): Balance;Edema;Endurance;Motor;Nutrition;Pain;Safety;Skin Integrity OT Basic ADL's Functional Problem(s): Grooming;Bathing;Dressing;Toileting OT Transfers Functional Problem(s): Toilet;Tub/Shower OT Plan OT Intensity: Minimum of 1-2 x/day, 45 to 90 minutes OT Frequency: 5 out of 7 days OT Duration/Estimated Length of Stay: 2.5-3 weeks OT Treatment/Interventions: Balance/vestibular training;Discharge planning;Pain management;Self Care/advanced ADL retraining;Therapeutic Activities;UE/LE Coordination activities;Disease mangement/prevention;Functional mobility training;Patient/family education;Skin care/wound managment;Therapeutic Exercise;Wheelchair propulsion/positioning;UE/LE Strength taining/ROM;Splinting/orthotics;Psychosocial support;Neuromuscular re-education;DME/adaptive equipment instruction;Community reintegration OT Self Feeding Anticipated Outcome(s): no goal OT Basic Self-Care Anticipated Outcome(s): S-MIN A OT Toileting Anticipated Outcome(s): S OT Bathroom Transfers Anticipated Outcome(s): S OT  Recommendation Follow Up Recommendations: Home health OT   Skilled Therapeutic Intervention 1;1. Pt received in bed agreeable to OT but in pain. Pt has familiarity with rehab process from previous accident. OT retrieved w/c, cushion and platform for RW. Pt completes bathing dressing and grooming sesated at EOB with MIN A for UB ADLs and MAX A-+2 A for LB ADLs at sit to stand level. Pt limited by pain and uses bed rail to sit to stand with RUE. Exited session with pt seated in bed, exi talarm on and call light in reach  OT Evaluation Precautions/Restrictions  Precautions Precautions: Fall Required Braces or Orthoses: Sling Restrictions Weight Bearing Restrictions: Yes LUE Weight Bearing: Weight bear through elbow only LLE Weight Bearing: Weight bearing as tolerated Other Position/Activity Restrictions: no L hip active abduction General Chart Reviewed: Yes Family/Caregiver Present: No Vital Signs Therapy Vitals Temp: (!) 97.5 F (36.4 C) Temp Source: Oral Pulse Rate: (!) 101 Resp: 16 BP: (!) 151/61 Patient Position (if appropriate): Lying Oxygen Therapy SpO2: 93 % O2 Device: Room Air Pain Pain Assessment Pain Scale: 0-10 Pain Score: 7  Pain Type: Acute pain Pain Location: Hip Pain Orientation: Left Pain Descriptors / Indicators: Aching;Crying Pain Frequency: Constant Pain Onset: On-going Patients Stated Pain Goal: 2 Pain Intervention(s): Medication (See eMAR) Home Living/Prior Functioning Home Living Family/patient expects to be discharged to:: Private residence Living Arrangements: Spouse/significant other, Children, Parent Available Help at Discharge: Family, Available 24 hours/day Type of Home: House Home Layout: Two level, Able to live on main level with bedroom/bathroom Alternate Level Stairs-Number of Steps: 5 Bathroom Shower/Tub: Multimedia programmer: Standard Additional Comments: Pt lives with family but all members are restricted in ability to  assist pt Prior Function Level of Independence: Independent with basic ADLs, Requires assistive device for independence Driving: No Comments: ambulates with RW ADL ADL Grooming: Minimal assistance Where Assessed-Grooming: Edge of bed Upper Body Bathing: Minimal assistance Where Assessed-Upper Body  Bathing: Edge of bed Lower Body Bathing: Maximal assistance Where Assessed-Lower Body Bathing: Edge of bed Upper Body Dressing: Supervision/safety Where Assessed-Upper Body Dressing: Edge of bed Lower Body Dressing: (2 helper sit to stand) Where Assessed-Lower Body Dressing: Edge of bed Vision Baseline Vision/History: Wears glasses Wears Glasses: At all times Patient Visual Report: No change from baseline Vision Assessment?: No apparent visual deficits Perception  Perception: Within Functional Limits Praxis Praxis: Intact Cognition Overall Cognitive Status: Within Functional Limits for tasks assessed Arousal/Alertness: Awake/alert Orientation Level: Person;Place;Situation Person: Oriented Place: Oriented Situation: Oriented Year: 2021 Month: January Day of Week: Correct Memory: Appears intact Immediate Memory Recall: Sock;Blue;Bed Memory Recall Sock: Without Cue Memory Recall Blue: Without Cue Memory Recall Bed: Without Cue Attention: Selective Selective Attention: Appears intact Sensation Sensation Light Touch: Appears Intact Proprioception: Appears Intact Coordination Gross Motor Movements are Fluid and Coordinated: No Fine Motor Movements are Fluid and Coordinated: No Coordination and Movement Description: limited by pain Motor  Motor Motor: Abnormal postural alignment and control Mobility  Bed Mobility Bed Mobility: Supine to Sit;Sit to Supine Supine to Sit: Total Assistance - Patient < 25% Sit to Supine: Total Assistance - Patient < 25% Transfers Sit to Stand: Maximal Assistance - Patient 25-49% Stand to Sit: Maximal Assistance - Patient 25-49%   Trunk/Postural Assessment  Cervical Assessment Cervical Assessment: (head forward) Thoracic Assessment Thoracic Assessment: (ropunded shoulders) Lumbar Assessment Lumbar Assessment: (post pelvic tilt) Postural Control Postural Control: Deficits on evaluation(delayed)  Balance Balance Balance Assessed: Yes Dynamic Sitting Balance Dynamic Sitting - Balance Support: Feet unsupported;Right upper extremity supported Dynamic Sitting - Level of Assistance: 4: Min assist Static Standing Balance Static Standing - Balance Support: During functional activity Static Standing - Level of Assistance: 3: Mod assist Extremity/Trunk Assessment RUE Assessment RUE Assessment: Within Functional Limits LUE Assessment LUE Assessment: Exceptions to [facility] General Strength Comments: wrist FX, no formal assessment, edematous digits     Refer to Care Plan for Long Term Goals  Recommendations for other services: None    Discharge Criteria: Patient will be discharged from OT if patient refuses treatment 3 consecutive times without medical reason, if treatment goals not met, if there is a change in medical status, if patient makes no progress towards goals or if patient is discharged from hospital.  The above assessment, treatment plan, treatment alternatives and goals were discussed and mutually agreed upon: by patient  Tonny Branch 12/16/2019, 9:00 AM

## 2019-12-16 NOTE — Progress Notes (Signed)
[facility] PHYSICAL MEDICINE & REHABILITATION PROGRESS NOTE   Subjective/Complaints: Had a fair night. Still with a lot of pain in left arm and leg. Sometimes a struggle just to move in bed. Still hasn't moved bowels since in hospital. Appetite ok  ROS: Patient denies fever, rash, sore throat, blurred vision, nausea, vomiting, diarrhea, cough, shortness of breath or chest pain, joint or back pain, headache, or mood change.    Objective:   No results found. Recent Labs    12/14/19 0416 12/15/19 0353  WBC 11.5* 11.9*  HGB 9.8* 9.8*  HCT 32.6* 32.6*  PLT 145* 155   No results for input(s): NA, K, CL, CO2, GLUCOSE, BUN, CREATININE, CALCIUM in the last 72 hours.  Intake/Output Summary (Last 24 hours) at 12/16/2019 0919 Last data filed at 12/16/2019 0549 Gross per 24 hour  Intake --  Output 1425 ml  Net -1425 ml     Physical Exam: Vital Signs Blood pressure (!) 151/61, pulse (!) 101, temperature (!) 97.5 F (36.4 C), temperature source Oral, resp. rate 16, height 5\' 1"  (1.549 m), weight 81.2 kg, SpO2 93 %. Constitutional: No  Obvious distress . Vital signs reviewed. HEENT: EOMI, oral membranes moist Neck: supple Cardiovascular: RRR without murmur. No JVD    Respiratory: CTA Bilaterally without wheezes or rales. Normal effort    GI: BS +, non-tender, mildly-distended  Musculoskeletal:     Comments: Generalized edema LUE and LLE, bruises left hand and legs.  Neurological: She is alert and oriented to person, place, and time.  Patient is alert and oriented x3 Follows full commands. Motor: RUE: 4-/5 proximal to distal LLE: 3+-4-/5 proximal to distal limited by pain LUE: proximally limited by splint, hand grip 2/5 limited by splint/swelling LLE: 2+/5 proximal to distal Sensation diminished to light touch b/le feet  Skin:  Left hip incision with foam dressing, saturated in old blood Left upper extremity with sugar tong splint in place. Scattered ecchymoses as above.     Psychiatric: She has a normal mood and affect. Her behavior is normal.     Assessment/Plan: 1. Functional deficits secondary to polytrauma which require 3+ hours per day of interdisciplinary therapy in a comprehensive inpatient rehab setting.  Physiatrist is providing close team supervision and 24 hour management of active medical problems listed below.  Physiatrist and rehab team continue to assess barriers to discharge/monitor patient progress toward functional and medical goals  Care Tool:  Bathing              Bathing assist       Upper Body Dressing/Undressing Upper body dressing        Upper body assist      Lower Body Dressing/Undressing Lower body dressing            Lower body assist       Toileting Toileting    Toileting assist       Transfers Chair/bed transfer  Transfers assist           Locomotion Ambulation   Ambulation assist              Walk 10 feet activity   Assist           Walk 50 feet activity   Assist           Walk 150 feet activity   Assist           Walk 10 feet on uneven surface  activity   Assist  Wheelchair     Assist               Wheelchair 50 feet with 2 turns activity    Assist            Wheelchair 150 feet activity     Assist          Blood pressure (!) 151/61, pulse (!) 101, temperature (!) 97.5 F (36.4 C), temperature source Oral, resp. rate 16, height 5\' 1"  (1.549 m), weight 81.2 kg, SpO2 93 %.  Medical Problem List and Plan: 1.  Decreased functional mobility secondary to left displaced femoral neck fracture status post left total hip replacement anterior approach as well as severely left comminuted distal radius fracture status post ORIF 12/08/2019 as well as forearm laceration repair.  Weightbearing as tolerated through left elbow only and weightbearing as tolerated left lower extremity.  No left hip abduction              -patient may   Shower if left arm and hip wound covered             -ELOS/Goals: 16-20 days/Min/Mod A            -beginning therapies 2.  Antithrombotics: -DVT/anticoagulation: SCDs.  Dopplers ordered.             -antiplatelet therapy: Aspirin 325 mg daily 3. Pain Management: Hydrocodone and Robaxin as needed             using hydrocodone around the clock, having a hard time keeping up with pain.   -add ms contin 15mg  q12 for now 4. Mood: Prozac 40 mg daily             -antipsychotic agents: N/A 5. Neuropsych: This patient is capable of making decisions on her own behalf. 6. Skin/Wound Care: change hip dressing 7. Fluids/Electrolytes/Nutrition: Routine in and outs. CMP ordered. 8.  Polycystic kidney disease status post renal transplant 06/02/2012.  Baseline creatinine 1.02-1.29.  Continue Prograf, chronic prednisone as well as Myfortic.  Followed by Dr. Jimmy Footman             CMP ordered. 9.  Acute blood loss anemia.  Transfused 2 units packed red blood cells 12/11/2019.               Hgb holding at 9.8 on 1.22 10.  Asthma.  Continue inhalers as directed. 11.  History of gout.  Allopurinol daily.  Monitor for any gout flareups 12.  Chronic diastolic congestive heart failure.  Lasix 40 mg daily. Monitor for sign/symptoms of fluid overload. Daily weights.   Filed Weights   12/15/19 1757  Weight: 81.2 kg    13. Constipation: sorbitol, SSE later today if needed  -scheduled senna-s LOS: 1 days A FACE TO FACE EVALUATION WAS PERFORMED  Meredith Staggers 12/16/2019, 9:19 AM

## 2019-12-17 ENCOUNTER — Inpatient Hospital Stay (HOSPITAL_COMMUNITY): Payer: Medicare Other | Admitting: Occupational Therapy

## 2019-12-17 NOTE — Progress Notes (Signed)
[place] PHYSICAL MEDICINE & REHABILITATION PROGRESS NOTE   Subjective/Complaints: Still having left leg/hip pain. Better control with MS contin. Worked through pain with therapies. Moved bowels  ROS: Patient denies fever, rash, sore throat, blurred vision, nausea, vomiting, diarrhea, cough, shortness of breath or chest pain,  headache, or mood change.     Objective:   VAS [place] LOWER EXTREMITY VENOUS (DVT)  Result Date: 12/16/2019  Lower Venous Study Indications: Swelling.  Risk Factors: Surgery left hip. Limitations: Body habitus and poor ultrasound/tissue interface. Comparison Study: 10/03/2018- negative bilateral lower extremity venous duplex Performing Technologist: Maudry Mayhew MHA, RDMS, RVT, RDCS  Examination Guidelines: A complete evaluation includes B-mode imaging, spectral Doppler, color Doppler, and power Doppler as needed of all accessible portions of each vessel. Bilateral testing is considered an integral part of a complete examination. Limited examinations for reoccurring indications may be performed as noted.  +---------+---------------+---------+-----------+----------+--------------+ RIGHT    CompressibilityPhasicitySpontaneityPropertiesThrombus Aging +---------+---------------+---------+-----------+----------+--------------+ CFV      Full           Yes      Yes                                 +---------+---------------+---------+-----------+----------+--------------+ SFJ      Full                                                        +---------+---------------+---------+-----------+----------+--------------+ FV Prox  Full                                                        +---------+---------------+---------+-----------+----------+--------------+ FV Mid   Full                                                        +---------+---------------+---------+-----------+----------+--------------+ FV DistalFull                                                         +---------+---------------+---------+-----------+----------+--------------+ PFV      Full                                                        +---------+---------------+---------+-----------+----------+--------------+ POP      Full           Yes      Yes                                 +---------+---------------+---------+-----------+----------+--------------+ PTV      Full                                                        +---------+---------------+---------+-----------+----------+--------------+  PERO     Full                                                        +---------+---------------+---------+-----------+----------+--------------+   +---------+---------------+---------+-----------+----------+--------------+ LEFT     CompressibilityPhasicitySpontaneityPropertiesThrombus Aging +---------+---------------+---------+-----------+----------+--------------+ CFV      Full           Yes      Yes                                 +---------+---------------+---------+-----------+----------+--------------+ SFJ      Full                                                        +---------+---------------+---------+-----------+----------+--------------+ FV Prox  Full                                                        +---------+---------------+---------+-----------+----------+--------------+ FV Mid   Full                                                        +---------+---------------+---------+-----------+----------+--------------+ FV DistalFull                                                        +---------+---------------+---------+-----------+----------+--------------+ PFV      Full                                                        +---------+---------------+---------+-----------+----------+--------------+ POP      Full           Yes      Yes                                  +---------+---------------+---------+-----------+----------+--------------+ PTV      Full                                                        +---------+---------------+---------+-----------+----------+--------------+ PERO     Full                                                        +---------+---------------+---------+-----------+----------+--------------+  Summary: Right: There is no evidence of deep vein thrombosis in the lower extremity. No cystic structure found in the popliteal fossa. Left: There is no evidence of deep vein thrombosis in the lower extremity. No cystic structure found in the popliteal fossa.  *See table(s) above for measurements and observations.    Preliminary    Recent Labs    12/15/19 0353  WBC 11.9*  HGB 9.8*  HCT 32.6*  PLT 155   No results for input(s): NA, K, CL, CO2, GLUCOSE, BUN, CREATININE, CALCIUM in the last 72 hours.  Intake/Output Summary (Last 24 hours) at 12/17/2019 1102 Last data filed at 12/17/2019 0527 Gross per 24 hour  Intake 720 ml  Output 600 ml  Net 120 ml     Physical Exam: Vital Signs Blood pressure 135/60, pulse 95, temperature 97.9 F (36.6 C), temperature source Oral, resp. rate 16, height 5\' 1"  (1.549 m), weight 81.2 kg, SpO2 93 %. Constitutional: No distress . Vital signs reviewed. obese HEENT: EOMI, oral membranes moist Neck: supple Cardiovascular: RRR without murmur. No JVD    Respiratory: CTA Bilaterally without wheezes or rales. Normal effort    GI: BS +, non-tender, non-distended  Musculoskeletal:     Comments: Generalized edema LUE and LLE, bruises left hand and legs. Left leg tender with minimal PROM Neurological: She is alert and oriented to person, place, and time.  Patient is alert and oriented x3 Follows full commands. Motor: RUE: 4-/5 proximal to distal LLE: 3+-4-/5 proximal to distal limited by pain LUE: proximally limited by splint, hand grip 2/5 limited by splint/swelling LLE: 2+/5 proximal  to distal Sensation diminished to light touch b/le feet  Skin:  Left hip incision with new foam dressing Left upper extremity with sugar tong splint in place. Scattered ecchymoses as above.    Psychiatric: She has a normal mood and affect. Her behavior is normal.     Assessment/Plan: 1. Functional deficits secondary to polytrauma which require 3+ hours per day of interdisciplinary therapy in a comprehensive inpatient rehab setting.  Physiatrist is providing close team supervision and 24 hour management of active medical problems listed below.  Physiatrist and rehab team continue to assess barriers to discharge/monitor patient progress toward functional and medical goals  Care Tool:  Bathing    Body parts bathed by patient: Left arm, Chest, Abdomen, Front perineal area, Right upper leg, Left upper leg, Face   Body parts bathed by helper: Right arm, Buttocks Body parts n/a: Right lower leg, Left lower leg   Bathing assist Assist Level: Maximal Assistance - Patient 24 - 49%     Upper Body Dressing/Undressing Upper body dressing   What is the patient wearing?: Pull over shirt    Upper body assist Assist Level: Supervision/Verbal cueing    Lower Body Dressing/Undressing Lower body dressing      What is the patient wearing?: Pants     Lower body assist Assist for lower body dressing: 2 Helpers     Toileting Toileting Toileting Activity did not occur (Clothing management and hygiene only): N/A (no void or bm)  Toileting assist Assist for toileting: Moderate Assistance - Patient 50 - 74%     Transfers Chair/bed transfer  Transfers assist  Chair/bed transfer activity did not occur: Safety/medical concerns        Locomotion Ambulation   Ambulation assist   Ambulation activity did not occur: Safety/medical concerns          Walk 10 feet activity   Assist  Walk  10 feet activity did not occur: Safety/medical concerns        Walk 50 feet  activity   Assist Walk 50 feet with 2 turns activity did not occur: Safety/medical concerns         Walk 150 feet activity   Assist Walk 150 feet activity did not occur: Safety/medical concerns         Walk 10 feet on uneven surface  activity   Assist Walk 10 feet on uneven surfaces activity did not occur: Safety/medical concerns         Wheelchair     Assist     Wheelchair activity did not occur: Safety/medical concerns         Wheelchair 50 feet with 2 turns activity    Assist    Wheelchair 50 feet with 2 turns activity did not occur: Safety/medical concerns       Wheelchair 150 feet activity     Assist  Wheelchair 150 feet activity did not occur: Safety/medical concerns       Blood pressure 135/60, pulse 95, temperature 97.9 F (36.6 C), temperature source Oral, resp. rate 16, height 5\' 1"  (1.549 m), weight 81.2 kg, SpO2 93 %.  Medical Problem List and Plan: 1.  Decreased functional mobility secondary to left displaced femoral neck fracture status post left total hip replacement anterior approach as well as severely left comminuted distal radius fracture status post ORIF 12/08/2019 as well as forearm laceration repair.  Weightbearing as tolerated through left elbow only and weightbearing as tolerated left lower extremity.  No left hip abduction             -patient may  shower if left arm and hip wound covered             -ELOS/Goals: 16-20 days/Min/Mod A            -Continue CIR therapies including PT, OT  2.  Antithrombotics: -DVT/anticoagulation: SCDs.  Dopplers pending             -antiplatelet therapy: Aspirin 325 mg daily 3. Pain Management: Hydrocodone and Robaxin as needed              1/23-add ms contin 15mg  q12 for now d/t significant pain at rest/with activity  1/24 improved pain control 4. Mood: Prozac 40 mg daily             -antipsychotic agents: N/A 5. Neuropsych: This patient is capable of making decisions on her own  behalf. 6. Skin/Wound Care: change hip dressing 7. Fluids/Electrolytes/Nutrition: Routine in and outs. CMP ordered. 8.  Polycystic kidney disease status post renal transplant 06/02/2012.  Baseline creatinine 1.02-1.29.  Continue Prograf, chronic prednisone as well as Myfortic.  Followed by Dr. Jimmy Footman             CMP ordered. 9.  Acute blood loss anemia.  Transfused 2 units packed red blood cells 12/11/2019.               Hgb holding at 9.8 on 1.22 10.  Asthma.  Continue inhalers as directed. 11.  History of gout.  Allopurinol daily.  Monitor for any gout flareups 12.  Chronic diastolic congestive heart failure.  Lasix 40 mg daily. Monitor for sign/symptoms of fluid overload. Need daily weights Filed Weights   12/15/19 1757  Weight: 81.2 kg    13. Constipation: sorbitol, SSE   if needed  -scheduled senna-s  -large BM 1/23 LOS: 2 days A FACE TO FACE EVALUATION WAS  PERFORMED  Meredith Staggers 12/17/2019, 11:02 AM

## 2019-12-17 NOTE — Progress Notes (Signed)
Occupational Therapy Session Note  Patient Details  Name: April [name] MRN: [id number] Date of Birth: 1956/05/10  Today's Date: 12/17/2019 OT Individual Time: 1425-1500 OT Individual Time Calculation (min): 35 min    Short Term Goals: Week 1:  OT Short Term Goal 1 (Week 1): Pt will sit to stand wiht MOD A of 1 in prep for CM OT Short Term Goal 2 (Week 1): Pt will transfer to w/c or BSC with MAX A of 1 to decrease BOC OT Short Term Goal 3 (Week 1): Pt will thread 1LE into pants with AE PRN OT Short Term Goal 4 (Week 1): Pt will groom with set up  Skilled Therapeutic Interventions/Progress Updates:    Had attempted to see pt eariler at scheduled time but pt with fatigue and lots of left hip pain and had requested to rest.    Came back in pm and pt able to participate. Pt came to EOB off right side of bed with mod A with extra time- required A for left hip translation forward and for trunk support, Pt very sweaty throughout session. Performed stand pivot transfer from bed to [facility] with platform walker with min to mod A with total A for toileting steps. Pt then transferred with min A to recliner with cues for sequencing and for RW management. Pt doff sweaty shirt and donned clean one with max A. Pt left in recliner to rest with LEs elevated and ice packs below.  Missed 30 min from earlier.   Therapy Documentation Precautions:  Precautions Precautions: Fall Required Braces or Orthoses: Sling Restrictions Weight Bearing Restrictions: Yes LUE Weight Bearing: Weight bear through elbow only LLE Weight Bearing: Weight bearing as tolerated Other Position/Activity Restrictions: no L hip active abduction General:   Vital Signs:   Pain:  ongoing pain in left hip - allowed for rest breaks, rescheduled session and applied ice packs after session to left hip and left wrist.   Therapy/Group: Individual Therapy  Willeen Cass [facility] 12/17/2019, 3:05 PM

## 2019-12-18 ENCOUNTER — Inpatient Hospital Stay (HOSPITAL_COMMUNITY): Payer: Medicare Other | Admitting: Physical Therapy

## 2019-12-18 ENCOUNTER — Inpatient Hospital Stay (HOSPITAL_COMMUNITY): Payer: Medicare Other

## 2019-12-18 LAB — COMPREHENSIVE METABOLIC PANEL
ALT: 13 U/L (ref 0–44)
AST: 14 U/L — ABNORMAL LOW (ref 15–41)
Albumin: 2 g/dL — ABNORMAL LOW (ref 3.5–5.0)
Alkaline Phosphatase: 138 U/L — ABNORMAL HIGH (ref 38–126)
Anion gap: 8 (ref 5–15)
BUN: 36 mg/dL — ABNORMAL HIGH (ref 8–23)
CO2: 27 mmol/L (ref 22–32)
Calcium: 9.2 mg/dL (ref 8.9–10.3)
Chloride: 96 mmol/L — ABNORMAL LOW (ref 98–111)
Creatinine, Ser: 1.21 mg/dL — ABNORMAL HIGH (ref 0.44–1.00)
GFR calc Af Amer: 56 mL/min — ABNORMAL LOW (ref >60)
GFR calc non Af Amer: 48 mL/min — ABNORMAL LOW (ref >60)
Glucose, Bld: 129 mg/dL — ABNORMAL HIGH (ref 70–99)
Potassium: 4.4 mmol/L (ref 3.5–5.1)
Sodium: 131 mmol/L — ABNORMAL LOW (ref 135–145)
Total Bilirubin: 1.5 mg/dL — ABNORMAL HIGH (ref 0.3–1.2)
Total Protein: 6.1 g/dL — ABNORMAL LOW (ref 6.5–8.1)

## 2019-12-18 LAB — CBC WITH DIFFERENTIAL/PLATELET
Abs Immature Granulocytes: 0.13 10*3/uL — ABNORMAL HIGH (ref 0.00–0.07)
Basophils Absolute: 0 10*3/uL (ref 0.0–0.1)
Basophils Relative: 0 %
Eosinophils Absolute: 0.1 10*3/uL (ref 0.0–0.5)
Eosinophils Relative: 1 %
HCT: 29.8 % — ABNORMAL LOW (ref 36.0–46.0)
Hemoglobin: 9.2 g/dL — ABNORMAL LOW (ref 12.0–15.0)
Immature Granulocytes: 1 %
Lymphocytes Relative: 8 %
Lymphs Abs: 0.9 10*3/uL (ref 0.7–4.0)
MCH: 28.8 pg (ref 26.0–34.0)
MCHC: 30.9 g/dL (ref 30.0–36.0)
MCV: 93.4 fL (ref 80.0–100.0)
Monocytes Absolute: 0.4 10*3/uL (ref 0.1–1.0)
Monocytes Relative: 3 %
Neutro Abs: 10.5 10*3/uL — ABNORMAL HIGH (ref 1.7–7.7)
Neutrophils Relative %: 87 %
Platelets: 228 10*3/uL (ref 150–400)
RBC: 3.19 MIL/uL — ABNORMAL LOW (ref 3.87–5.11)
RDW: 17.8 % — ABNORMAL HIGH (ref 11.5–15.5)
WBC: 12 10*3/uL — ABNORMAL HIGH (ref 4.0–10.5)
nRBC: 0.2 % (ref 0.0–0.2)

## 2019-12-18 MED ORDER — PANTOPRAZOLE SODIUM 40 MG PO TBEC
40.0000 mg | DELAYED_RELEASE_TABLET | Freq: Two times a day (BID) | ORAL | Status: DC
  Administered 2019-12-18 – 2020-01-05 (×36): 40 mg via ORAL
  Filled 2019-12-18 (×36): qty 1

## 2019-12-18 MED ORDER — ACETAMINOPHEN 325 MG PO TABS
650.0000 mg | ORAL_TABLET | Freq: Three times a day (TID) | ORAL | Status: DC
  Administered 2019-12-18 – 2020-01-05 (×51): 650 mg via ORAL
  Filled 2019-12-18 (×53): qty 2

## 2019-12-18 MED ORDER — CALCIUM CARBONATE ANTACID 500 MG PO CHEW
2.0000 | CHEWABLE_TABLET | Freq: Four times a day (QID) | ORAL | Status: DC | PRN
  Administered 2019-12-18: 400 mg via ORAL
  Filled 2019-12-18: qty 2

## 2019-12-18 NOTE — Progress Notes (Signed)
Social Work Assessment and Plan   Patient Details  Name: April [name] MRN: [id number] Date of Birth: 06/20/1956  Today's Date: 12/18/2019  Problem List:  Patient Active Problem List   Diagnosis Date Noted  . Left displaced femoral neck fracture (HCC) 12/15/2019  . Status post total hip replacement, left   . Post-op pain   . Acute blood loss anemia   . Polycystic kidney   . Closed left hip fracture (HCC) 12/07/2019  . Left wrist fracture 12/07/2019  . Medication management 09/22/2019  . Physical deconditioning 09/22/2019  . Hyponatremia 06/29/2019  . Acute respiratory failure with hypoxia (HCC) 04/19/2019  . Acute on chronic diastolic CHF (congestive heart failure) (HCC) 04/19/2019  . Bilateral closed proximal tibial fracture 12/21/2018  . Closed right ankle fracture 12/21/2018  . Fracture 12/21/2018  . Lower urinary tract infectious disease 10/03/2018  . Asthma, chronic, unspecified asthma severity, with acute exacerbation 10/03/2018  . SIRS (systemic inflammatory response syndrome) (HCC) 10/03/2018  . Nausea vomiting and diarrhea 10/02/2018  . Chronic diastolic CHF (congestive heart failure) (HCC) 10/01/2017  . Influenza B 10/01/2017  . Persistent asthma with status asthmaticus   . Pneumonia 07/15/2016  . Asthma, mild intermittent 07/15/2016  . GERD (gastroesophageal reflux disease) 07/15/2016  . Renal transplant recipient 07/15/2016  . Hyperlipidemia 07/15/2016  . Depression 07/15/2016  . Bronchitis, mucopurulent recurrent (HCC) 08/08/2014  . Chronic cough 07/24/2014  . End stage renal disease (HCC) 03/23/2012  . 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, implant, and graft 03/23/2012   Past Medical History:  Past Medical History:  Diagnosis Date  . Arthritis   . Asthma   . Depression   . GERD (gastroesophageal reflux disease)   . Hyperlipidemia   . Hyperparathyroidism   . Polycystic kidney   . PONV (postoperative nausea and vomiting)    Past  Surgical History:  Past Surgical History:  Procedure Laterality Date  . 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 BILATERAL OOPHORECTOMY    . CARPAL TUNNEL RELEASE  2000  . CESAREAN SECTION    . INSERTION OF DIALYSIS CATHETER  03/31/2012   Procedure: INSERTION OF DIALYSIS CATHETER;  Surgeon: Christopher S Dickson, MD;  Location: [facility] OR;  Service: Vascular;  Laterality: N/A;  insertion of dialysis catheter right internal jugular vein  . 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 ORIF TIBIA FRACTURE Left 12/23/2018   Procedure: OPEN REDUCTION INTERNAL FIXATION (ORIF) TIBIA FRACTURE;  Surgeon: Haddix, Kevin P, MD;  Location: [facility] OR;  Service: Orthopedics;  Laterality: Left;  . ORIF TIBIA PLATEAU Right 12/23/2018   Procedure: OPEN REDUCTION INTERNAL FIXATION (ORIF) TIBIAL PLATEAU;  Surgeon: Haddix, Kevin P, MD;  Location: [facility] OR;  Service: Orthopedics;  Laterality: Right;  . ORIF WRIST FRACTURE Left 12/08/2019   Procedure: OPEN REDUCTION INTERNAL FIXATION (ORIF) WRIST FRACTURE, REPAIR LACERATION LEFT FOREARM;  Surgeon: Graves, John, MD;  Location: [facility] ORS;  Service: Orthopedics;  Laterality: Left;  . TONSILLECTOMY  1967  . TOTAL HIP ARTHROPLASTY Left 12/08/2019   Procedure: TOTAL HIP ARTHROPLASTY ANTERIOR APPROACH;  Surgeon: Graves, John, MD;  Location: [facility] ORS;  Service: Orthopedics;  Laterality: Left;  . TUBAL LIGATION  2010   Social History:  reports that she quit smoking about 21 years ago. Her smoking use included cigarettes. She has a 3.75 pack-year smoking history. She has never used smokeless tobacco. She reports that she does not drink alcohol or use drugs.  Family / Support Systems Marital   Status: Married How Long?: 31 yrs Patient Roles: Spouse Spouse/Significant Other: Deborah Lazcano (husband): [phone] home; [phone] cell Children: 65-20 yr old son that lives in the home; 35- 47 yr old dtr that lives locally. Other Supports: Pt reports support from friends/church  family Anticipated Caregiver: Pt primary caregivers will be her husband (Limited support) and adult son who lives in the home who will help with more physical support and transportation to/from medical appointments. Pt reports her mother will continue to make meals as she is able. Dtr will help with running errands PRN. Ability/Limitations of Caregiver: Pt husband is limited to provide support due to recent  hip fracture and currently in OPT and using RW. Pt reports husband's physical support will be with caution. Caregiver Availability: 24/7 Family Dynamics: Pt lives in the home with her husband, 56 yr old son, and 58 yr old mother. Pt reports her husband will return to work in March. Reports that her son will continue to stay in the home. Pt reports that her mother is physically able to care for herself.  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 Retired in 2013. Education: BA in Social Work Read: Yes Write: Yes Employment Status: Retired Date Retired/Disabled/Unemployed: 2013 Age Retired: 56 Public relations account executive Issues: Denies any Guardian/Conservator: N/A   Abuse/Neglect Abuse/Neglect Assessment Can Be Completed: (None per pt report) Physical Abuse: Denies Verbal Abuse: Denies Sexual Abuse: Denies Exploitation of patient/patient's resources: Denies Self-Neglect: Denies  Emotional Status Pt's affect, behavior and adjustment status: Pt is in good spirits and has experience with rehab as she was here the same time last year due to leg fractures. Pt reports that if she feels her depression is affected, she is willing to ask for help if needed. Currently on fluoxetine prescribed by PCP. Recent Psychosocial Issues: None Reported Psychiatric History: None Substance Abuse History: N/A  Patient / Family Perceptions, Expectations &  Goals Pt/Family understanding of illness & functional limitations: Pt has a general understanding of her own limitations. Premorbid pt/family roles/activities: Pt was using a walker PTA. Pt reports that she made some meals at times. Anticipated changes in roles/activities/participation: Pt will be unable to ambulate and get out of bed without assistance. Pt reports she intends to rely on support from her son. Pt/family expectations/goals: Pt would like to be able to be independent with the use of her RW as she was PTA.  Community Resources [employer]: None Premorbid Home Care/DME Agencies: None Transportation available at discharge: Pt son or husband to transport to home at discharge Resource referrals recommended: Psychology  Discharge Planning Living Arrangements: Spouse/significant other, Children, Parent Support Systems: Spouse/significant other, Children, Parent, Water engineer, Social worker community Type of Residence: Private residence Insurance underwriter Resources: ([place] Medicare) Museum/gallery curator Resources: [place], Other (Comment)(savings) Financial Screen Referred: No Living Expenses: Own Money Management: Patient Does the patient have any problems obtaining your medications?: No Home Management: Pt manages her own medications Social Work Anticipated Follow Up Needs: [place] Additional Notes/Comments: Plan is for pt to discharge to home with support from her husband and son. Expected length of stay: 18-21 days  Clinical Impression SW met with pt in room at bedside to introduce self, explain role, and discuss discharge process. Pt reports plan is to discharge to home with support from husband and son. Pt denies jail/prison hx. No HCPOA in place.Pt admits that she quit smoking cigarettes 30 yrs  ago due to becoming pregnant. Pt states she does not drink. Pt denies recreational drug use. Pt admits to depression in which she takes Fluoxetine. Pt admits to counseling in  the past. Pt reported DME: RW, BSC, w/c, TTB, shower chair, and a bed rail ( on her side of the bed).   Madolin Twaddle A Jaqueline Uber 12/18/2019, 4:03 PM

## 2019-12-18 NOTE — Progress Notes (Signed)
Occupational Therapy Session Note  Patient Details  Name: April [name] MRN: [id number] Date of Birth: [birth year]  Today's Date: 12/18/2019 OT Individual Time: 4081-4481 OT Individual Time Calculation (min): 45 min   Session 2: OT Individual Time: 8563-1497 OT Individual Time Calculation (min): 69 min    Short Term Goals: Week 1:  OT Short Term Goal 1 (Week 1): Pt will sit to stand wiht MOD A of 1 in prep for CM OT Short Term Goal 2 (Week 1): Pt will transfer to w/c or BSC with MAX A of 1 to decrease BOC OT Short Term Goal 3 (Week 1): Pt will thread 1LE into pants with AE PRN OT Short Term Goal 4 (Week 1): Pt will groom with set up  Skilled Therapeutic Interventions/Progress Updates:    Pt received supine in bed, c/o back pain 5/10 radiating laterally above hips. Pt completed bed mobility to EOB with increased time and effort/cueing with mod A overall. Pt completed sit > stand with PFRW with cueing for UE placement with mod A. Pt completed stand pivot to the [facility] with min A once on her feet. Pain increased to 7/10 once OOB, pt willing to continue on through pain to see if OOB and upright posture alleviated. Pt voided urine. Pt used reacher to attempt and thread pants over legs- requiring max A overall. Pt required cues for breathing and relaxing throughout session for pain management. Pt completed sit > stand from [facility] with mod A. She required total A for peri hygiene and clothing management. Pt completed several small steps to transfer to the recliner, mod A overall and mod cueing. Pt was left sitting up in the recliner with all needs met.   Session 2: pt received supine agreeable to OT session. Pt reporting 3/10 pain in her back at rest. Pt completed bed mobility to EOB with min A. Pt able to use PFRW to stand from EOB with min A. Min A provided for standing balance for pt to stand pivot to w/c. Pt sat at sink and was assisted in washing hair and removing large very knotted section with max  A overall. Extra time required for this task and resulted in increased mood and self-efficacy following. Pt completed UB dressing with min cueing for hemi techniques. Min A overall to wash under RUE. Pt able to utilize compensatory method to don deodorant, still asking for min A for thoroughness. Pt was left sitting up in the w/c with all needs met. Sling was donned to LUE for comfort per pt request.     Therapy Documentation Precautions:  Precautions Precautions: Fall Required Braces or Orthoses: Sling Restrictions Weight Bearing Restrictions: Yes LUE Weight Bearing: Weight bear through elbow only LLE Weight Bearing: Weight bearing as tolerated Other Position/Activity Restrictions: no L hip active abduction   Therapy/Group: Individual Therapy  Curtis Sites 12/18/2019, 7:01 AM

## 2019-12-18 NOTE — Progress Notes (Signed)
Physical Therapy Session Note  Patient Details  Name: April [name] MRN: [id number] Date of Birth: March 09, 1956  Today's Date: 12/18/2019 PT Individual Time: 0900-1000 PT Individual Time Calculation (min): 60 min   Short Term Goals: Week 1:  PT Short Term Goal 1 (Week 1): Pt will initiate transfer training PT Short Term Goal 2 (Week 1): Pt will perform bed mobility w/ max assist PT Short Term Goal 3 (Week 1): Pt will tolerate 60 min of upright activity w/ minimal increase in fatigue  Skilled Therapeutic Interventions/Progress Updates:   Pt in recliner and agreeable to therapy, pain 5/10 in L hip (premedicated). Got w/c cushion and L ELR for pt and performed stand pivot transfer to w/c w/ min assist and verbal/tactile cues for technique. Pt grimacing in L hip pain, resolves once seated in w/c. Total assist w/c transport to/from therapy gym. Worked on LLE ROM/strengthening this session (see list below), pt states pain remains 5/10, however she visibly looks closer to 8-9/10 on faces scale. Pt states "it feels like something is out of place in my hip". Educated on post-op healing of bone and effect of muscular contraction on healing and pain at that site. However made PA aware of pt's description of pain. Pt needed many rest breaks and slow speed 2/2 L hip pain. Returned to room via w/c. Stand pivot to EOB w/ min assist using PFRW and increased time 2/2 pain. Mod assist sit>supine. Ended session in supine, LLE elevated and ice applied to L anterior and lateral hip for pain relief. Missed 15 min of skilled PT 2/2 pain.   LLE ROM/strengthening: -quad set 3x10 -active assist SAQ 3x5 -ankle pump 3x10 -isometric knee flexion 2x10 -isometric hip abduction 2x10  Therapy Documentation Precautions:  Precautions Precautions: Fall Required Braces or Orthoses: Sling Restrictions Weight Bearing Restrictions: Yes LUE Weight Bearing: Weight bear through elbow only LLE Weight Bearing: Weight bearing  as tolerated Other Position/Activity Restrictions: no L hip active abduction Vital Signs: Oxygen Therapy SpO2: 95 % O2 Device: Room Air Pain: Pain Assessment Pain Scale: 0-10 Pain Score: 6  Pain Type: Acute pain Pain Location: Hip Pain Orientation: Left Pain Descriptors / Indicators: Stabbing Pain Frequency: Constant Pain Onset: On-going Pain Intervention(s): Medication (See eMAR)  Therapy/Group: Individual Therapy  Ramir Malerba K Janaki Exley 12/18/2019, 10:30 AM

## 2019-12-18 NOTE — Progress Notes (Addendum)
Pt c/o of indigestion, requesting Tums or Pepcid. Called on-call, left message for return call.  Received call back and have new orders to change Protonix to BID and add Tums (2) every 6 hrs PRN for indigestion.

## 2019-12-18 NOTE — Progress Notes (Signed)
[facility] PHYSICAL MEDICINE & REHABILITATION PROGRESS NOTE   Subjective/Complaints: Still having left leg/hip pain; limited therapies yesterday. While seated, also has coccyx pain.  Has not had BM yet today, but had large one yesterday.  Sleeping well at night.   ROS: Patient denies fever, rash, sore throat, blurred vision, nausea, vomiting, diarrhea, cough, shortness of breath or chest pain,  headache, or mood change.   Objective:   VAS [place] LOWER EXTREMITY VENOUS (DVT)  Result Date: 12/16/2019  Lower Venous Study Indications: Swelling.  Risk Factors: Surgery left hip. Limitations: Body habitus and poor ultrasound/tissue interface. Comparison Study: 10/03/2018- negative bilateral lower extremity venous duplex Performing Technologist: Maudry Mayhew MHA, RDMS, RVT, RDCS  Examination Guidelines: A complete evaluation includes B-mode imaging, spectral Doppler, color Doppler, and power Doppler as needed of all accessible portions of each vessel. Bilateral testing is considered an integral part of a complete examination. Limited examinations for reoccurring indications may be performed as noted.  +---------+---------------+---------+-----------+----------+--------------+ RIGHT    CompressibilityPhasicitySpontaneityPropertiesThrombus Aging +---------+---------------+---------+-----------+----------+--------------+ CFV      Full           Yes      Yes                                 +---------+---------------+---------+-----------+----------+--------------+ SFJ      Full                                                        +---------+---------------+---------+-----------+----------+--------------+ FV Prox  Full                                                        +---------+---------------+---------+-----------+----------+--------------+ FV Mid   Full                                                         +---------+---------------+---------+-----------+----------+--------------+ FV DistalFull                                                        +---------+---------------+---------+-----------+----------+--------------+ PFV      Full                                                        +---------+---------------+---------+-----------+----------+--------------+ POP      Full           Yes      Yes                                 +---------+---------------+---------+-----------+----------+--------------+ PTV  Full                                                        +---------+---------------+---------+-----------+----------+--------------+ PERO     Full                                                        +---------+---------------+---------+-----------+----------+--------------+   +---------+---------------+---------+-----------+----------+--------------+ LEFT     CompressibilityPhasicitySpontaneityPropertiesThrombus Aging +---------+---------------+---------+-----------+----------+--------------+ CFV      Full           Yes      Yes                                 +---------+---------------+---------+-----------+----------+--------------+ SFJ      Full                                                        +---------+---------------+---------+-----------+----------+--------------+ FV Prox  Full                                                        +---------+---------------+---------+-----------+----------+--------------+ FV Mid   Full                                                        +---------+---------------+---------+-----------+----------+--------------+ FV DistalFull                                                        +---------+---------------+---------+-----------+----------+--------------+ PFV      Full                                                         +---------+---------------+---------+-----------+----------+--------------+ POP      Full           Yes      Yes                                 +---------+---------------+---------+-----------+----------+--------------+ PTV      Full                                                        +---------+---------------+---------+-----------+----------+--------------+  PERO     Full                                                        +---------+---------------+---------+-----------+----------+--------------+     Summary: Right: There is no evidence of deep vein thrombosis in the lower extremity. No cystic structure found in the popliteal fossa. Left: There is no evidence of deep vein thrombosis in the lower extremity. No cystic structure found in the popliteal fossa.  *See table(s) above for measurements and observations.    Preliminary    Recent Labs    12/18/19 0719  WBC 12.0*  HGB 9.2*  HCT 29.8*  PLT 228   Recent Labs    12/18/19 0719  NA 131*  K 4.4  CL 96*  CO2 27  GLUCOSE 129*  BUN 36*  CREATININE 1.21*  CALCIUM 9.2   No intake or output data in the 24 hours ending 12/18/19 1004   Physical Exam: Vital Signs Blood pressure 120/63, pulse 98, temperature 98.2 F (36.8 C), temperature source Oral, resp. rate 18, height 5\' 1"  (1.549 m), weight 81.2 kg, SpO2 95 %. Constitutional: No distress . Vital signs reviewed. obese HEENT: EOMI, oral membranes moist Neck: supple Cardiovascular: RRR without murmur. No JVD    Respiratory: CTA Bilaterally without wheezes or rales. Normal effort    GI: BS +, non-tender, non-distended  Musculoskeletal:     Comments: Generalized edema LUE and LLE, bruises left hand and legs. Left leg tender with minimal PROM Neurological: She is alert and oriented to person, place, and time.  Patient is alert and oriented x3 Follows full commands. Motor: RUE: 4-/5 proximal to distal LLE: 3+-4-/5 proximal to distal limited by pain LUE:  proximally limited by splint, hand grip 2/5 limited by splint/swelling LLE: 2+/5 proximal to distal Sensation diminished to light touch b/le feet  Skin:  Left hip incision with new foam dressing Left upper extremity with sugar tong splint in place. Scattered ecchymoses as above.    Psychiatric: She has a normal mood and affect. Her behavior is normal.     Assessment/Plan: 1. Functional deficits secondary to polytrauma which require 3+ hours per day of interdisciplinary therapy in a comprehensive inpatient rehab setting.  Physiatrist is providing close team supervision and 24 hour management of active medical problems listed below.  Physiatrist and rehab team continue to assess barriers to discharge/monitor patient progress toward functional and medical goals  Care Tool:  Bathing    Body parts bathed by patient: Left arm, Chest, Abdomen, Front perineal area, Right upper leg, Left upper leg, Face   Body parts bathed by helper: Right arm, Buttocks Body parts n/a: Right lower leg, Left lower leg   Bathing assist Assist Level: Maximal Assistance - Patient 24 - 49%     Upper Body Dressing/Undressing Upper body dressing   What is the patient wearing?: Pull over shirt    Upper body assist Assist Level: Moderate Assistance - Patient 50 - 74%    Lower Body Dressing/Undressing Lower body dressing      What is the patient wearing?: Pants     Lower body assist Assist for lower body dressing: 2 Helpers     Toileting Toileting Toileting Activity did not occur (Clothing management and hygiene only): N/A (no void or bm)  Toileting assist Assist for toileting:  Total Assistance - Patient < 25%     Transfers Chair/bed transfer  Transfers assist  Chair/bed transfer activity did not occur: Safety/medical concerns  Chair/bed transfer assist level: 2 Helpers     Locomotion Ambulation   Ambulation assist   Ambulation activity did not occur: Safety/medical concerns           Walk 10 feet activity   Assist  Walk 10 feet activity did not occur: Safety/medical concerns        Walk 50 feet activity   Assist Walk 50 feet with 2 turns activity did not occur: Safety/medical concerns         Walk 150 feet activity   Assist Walk 150 feet activity did not occur: Safety/medical concerns         Walk 10 feet on uneven surface  activity   Assist Walk 10 feet on uneven surfaces activity did not occur: Safety/medical concerns         Wheelchair     Assist     Wheelchair activity did not occur: Safety/medical concerns         Wheelchair 50 feet with 2 turns activity    Assist    Wheelchair 50 feet with 2 turns activity did not occur: Safety/medical concerns       Wheelchair 150 feet activity     Assist  Wheelchair 150 feet activity did not occur: Safety/medical concerns       Blood pressure 120/63, pulse 98, temperature 98.2 F (36.8 C), temperature source Oral, resp. rate 18, height 5\' 1"  (1.549 m), weight 81.2 kg, SpO2 95 %.  Medical Problem List and Plan: 1.  Decreased functional mobility secondary to left displaced femoral neck fracture status post left total hip replacement anterior approach as well as severely left comminuted distal radius fracture status post ORIF 12/08/2019 as well as forearm laceration repair.  Weightbearing as tolerated through left elbow only and weightbearing as tolerated left lower extremity.  No left hip abduction             -patient may  shower if left arm and hip wound covered             -ELOS/Goals: 16-20 days/Min/Mod A            -Continue CIR therapies including PT, OT  2.  Antithrombotics: -DVT/anticoagulation: SCDs.  Dopplers pending             -antiplatelet therapy: Aspirin 325 mg daily 3. Pain Management: Hydrocodone and Robaxin as needed              1/23-add ms contin 15mg  q12 for now d/t significant pain at rest/with activity  1/24 improved pain control  1/25: Complains  of right coccyx pain. Agreeable to [employer]; ordered. As per therapy notes, pain continues to limit patient's mobility in sessions. AST and ALT are low/normal; will schedule Tylenol 650mg  q8Hfor better pain control.  4. Mood: Prozac 40 mg daily             -antipsychotic agents: N/A 5. Neuropsych: This patient is capable of making decisions on her own behalf. 6. Skin/Wound Care: change hip dressing 7. Fluids/Electrolytes/Nutrition: Routine in and outs. CMP ordered. 8.  Polycystic kidney disease status post renal transplant 06/02/2012.  Baseline creatinine 1.02-1.29.  Continue Prograf, chronic prednisone as well as Myfortic.  Followed by Dr. Jimmy Footman            1/25: CMP stable except for low Na 131 and elevated Cr 1.21.  Will repeat tomorrow to trend.  9.  Acute blood loss anemia.  Transfused 2 units packed red blood cells 12/11/2019.               Hgb holding at 9.8 on 1.22, 9.2 on 1/25 10.  Asthma.  Continue inhalers as directed. 11.  History of gout.  Allopurinol daily.  Monitor for any gout flareups 12.  Chronic diastolic congestive heart failure.  Lasix 40 mg daily. Monitor for sign/symptoms of fluid overload. Need daily weights Filed Weights   12/15/19 1757  Weight: 81.2 kg    13. Constipation: sorbitol, SSE   if needed  -scheduled senna-s  -large BM 1/23 and 1/24.  LOS: 3 days A FACE TO FACE EVALUATION WAS PERFORMED  Morayo Leven P Eliezer Khawaja 12/18/2019, 10:04 AM

## 2019-12-18 NOTE — Progress Notes (Signed)
Inpatient Rehabilitation  Patient information reviewed and entered into eRehab system by Fabrizio Filip M. Rosaly Labarbera, M.A., CCC/SLP, PPS Coordinator.  Information including medical coding, functional ability and quality indicators will be reviewed and updated through discharge.    

## 2019-12-18 NOTE — Care Management (Signed)
[facility] Individual Statement of Services  Patient Name:  April [name]  Date:  12/18/2019  Welcome to the [facility].  Our goal is to provide you with an individualized program based on your diagnosis and situation, designed to meet your specific needs.  With this comprehensive rehabilitation program, you will be expected to participate in at least 3 hours of rehabilitation therapies Monday-Friday, with modified therapy programming on the weekends.  Your rehabilitation program will include the following services:  Physical Therapy (PT), Occupational Therapy (OT), 24 hour per day rehabilitation nursing, Therapeutic Recreaction (TR), Neuropsychology, Case Management (Social Worker), Rehabilitation Medicine, Nutrition Services and Pharmacy Services  Weekly team conferences will be held on ] to discuss your progress.  Your Social Worker will talk with you frequently to get your input and to update you on team discussions.  Team conferences with you and your family in attendance may also be held.  Expected length of stay: 18-21 days  Overall anticipated outcome: Discharge to home with Supervision to Independent.  Depending on your progress and recovery, your program may change. Your Social Worker will coordinate services and will keep you informed of any changes. Your Social Worker's name and contact numbers are listed  below.  The following services may also be recommended but are not provided by the [facility] will be made to provide these services after discharge if needed.  Arrangements include referral to agencies that provide these services.  Your insurance has been verified to be:  [facility] Medicare  Your primary doctor is:  [name]  Pertinent information will  be shared with your doctor and your insurance company.  Social Worker:  Loralee Pacas, MSW  Information discussed with and copy given to patient by: [name], 12/18/2019, 4:09 PM

## 2019-12-18 NOTE — IPOC Note (Addendum)
Overall Plan of Care [facility]) Patient Details Name: April [name] MRN: [id number] DOB: 1956-01-07  Admitting Diagnosis: Left displaced femoral neck fracture [facility])  Hospital Problems: Principal Problem:   Left displaced femoral neck fracture ([facility])     Functional Problem List: Nursing Skin Integrity, Bladder, Bowel, Endurance, Medication Management, Motor, Pain  PT Balance, Edema, Behavior, Endurance, Motor, Pain, Safety, Sensory  OT Balance, Edema, Endurance, Motor, Nutrition, Pain, Safety, Skin Integrity  SLP    TR         Basic ADL's: OT Grooming, Bathing, Dressing, Toileting     Advanced  ADL's: OT       Transfers: PT Bed Mobility, Bed to Chair, Car, Floor, Manufacturing systems engineer, Metallurgist: PT Emergency planning/management officer, Ambulation, Stairs     Additional Impairments: OT    SLP        TR      Anticipated Outcomes Item Anticipated Outcome  Self Feeding no goal  Swallowing      Basic self-care  S-MIN A  Toileting  S   Bathroom Transfers S  Bowel/Bladder  Pt will manage bowel and bladder with mod assist  Transfers  supervision  Locomotion  supervision w/c mobility, no gait goals set at this time  Communication     Cognition     Pain  Pt will manage pain at 3 or less on a scale of 0-10.  Safety/Judgment  Pt will remain free of falls with injury while in rehab with min assist   Therapy Plan: PT Intensity: Minimum of 1-2 x/day ,45 to 90 minutes PT Frequency: 5 out of 7 days PT Duration Estimated Length of Stay: 18-21 days OT Intensity: Minimum of 1-2 x/day, 45 to 90 minutes OT Frequency: 5 out of 7 days OT Duration/Estimated Length of Stay: 2.5-3 weeks     Due to the current state of emergency, patients may not be receiving their 3-hours of Medicare-mandated therapy.   Team Interventions: Nursing Interventions Patient/Family Education, Bladder Management, Bowel Management, Pain Management, Discharge Planning, Skin Care/Wound  Management  PT interventions Ambulation/gait training, Community reintegration, DME/adaptive equipment instruction, Neuromuscular re-education, Psychosocial support, Stair training, UE/LE Strength taining/ROM, Wheelchair propulsion/positioning, UE/LE Coordination activities, Therapeutic Activities, Skin care/wound management, Functional electrical stimulation, Pain management, Discharge planning, Balance/vestibular training, Disease management/prevention, Functional mobility training, Patient/family education, Splinting/orthotics, Therapeutic Exercise  OT Interventions Balance/vestibular training, Discharge planning, Pain management, Self Care/advanced ADL retraining, Therapeutic Activities, UE/LE Coordination activities, Disease mangement/prevention, Functional mobility training, Patient/family education, Skin care/wound managment, Therapeutic Exercise, Wheelchair propulsion/positioning, UE/LE Strength taining/ROM, Splinting/orthotics, Psychosocial support, Neuromuscular re-education, DME/adaptive equipment instruction, Community reintegration  SLP Interventions    TR Interventions    SW/CM Interventions Discharge Planning, Psychosocial Support, Patient/Family Education   Barriers to Discharge MD  Medical stability  Nursing      PT Decreased caregiver support, Weight bearing restrictions caregivers only able to provide supervision level support  OT Decreased caregiver support, Lack of/limited family support    SLP      SW       Team Discharge Planning: Destination: PT-Home ,OT- Home , SLP-  Projected Follow-up: PT-Home health PT, OT-  Home health OT, SLP-  Projected Equipment Needs: PT-To be determined, OT- To be determined, SLP-  Equipment Details: PT-Has RW and w/c already, OT-  Patient/family involved in discharge planning: PT- Patient,  OT-Patient, SLP-   MD ELOS: 16-20 days Medical Rehab Prognosis:  Excellent Assessment:  April [name], April [name] is progressing well with therapy, though  limited by  fatigue and hip pain. She can transfer with Min A, perform lower body dressing MaxA, and sat at EOB for 2-3 minutes. Have increased pain medications and tried modalities to alleviate her pain and help her to better tolerate therapy; still working on achieving better pain control.    See Team Conference Notes for weekly updates to the plan of care

## 2019-12-19 ENCOUNTER — Inpatient Hospital Stay (HOSPITAL_COMMUNITY): Payer: Medicare Other | Admitting: Physical Therapy

## 2019-12-19 ENCOUNTER — Inpatient Hospital Stay (HOSPITAL_COMMUNITY): Payer: Medicare Other

## 2019-12-19 LAB — BASIC METABOLIC PANEL
Anion gap: 10 (ref 5–15)
BUN: 44 mg/dL — ABNORMAL HIGH (ref 8–23)
CO2: 26 mmol/L (ref 22–32)
Calcium: 9.3 mg/dL (ref 8.9–10.3)
Chloride: 95 mmol/L — ABNORMAL LOW (ref 98–111)
Creatinine, Ser: 1.32 mg/dL — ABNORMAL HIGH (ref 0.44–1.00)
GFR calc Af Amer: 50 mL/min — ABNORMAL LOW (ref >60)
GFR calc non Af Amer: 43 mL/min — ABNORMAL LOW (ref >60)
Glucose, Bld: 114 mg/dL — ABNORMAL HIGH (ref 70–99)
Potassium: 4.1 mmol/L (ref 3.5–5.1)
Sodium: 131 mmol/L — ABNORMAL LOW (ref 135–145)

## 2019-12-19 MED ORDER — FUROSEMIDE 40 MG PO TABS
40.0000 mg | ORAL_TABLET | Freq: Every day | ORAL | Status: DC
  Administered 2019-12-22 – 2019-12-25 (×4): 40 mg via ORAL
  Filled 2019-12-19 (×4): qty 1

## 2019-12-19 MED ORDER — METHOCARBAMOL 750 MG PO TABS
750.0000 mg | ORAL_TABLET | Freq: Four times a day (QID) | ORAL | Status: DC
  Administered 2019-12-19 – 2019-12-22 (×12): 750 mg via ORAL
  Filled 2019-12-19 (×12): qty 1

## 2019-12-19 NOTE — Progress Notes (Addendum)
[place] PHYSICAL MEDICINE & REHABILITATION PROGRESS NOTE   Subjective/Complaints: Pt had a good day yesterday but a lot of pain today in her "very low back down to her left buttock".   ROS: Patient denies fever, rash, sore throat, blurred vision, nausea, vomiting, diarrhea, cough, shortness of breath or chest pain,  headache, or mood change.    Objective:   No results found. Recent Labs    12/18/19 0719  WBC 12.0*  HGB 9.2*  HCT 29.8*  PLT 228   Recent Labs    12/18/19 0719 12/19/19 0533  NA 131* 131*  K 4.4 4.1  CL 96* 95*  CO2 27 26  GLUCOSE 129* 114*  BUN 36* 44*  CREATININE 1.21* 1.32*  CALCIUM 9.2 9.3    Intake/Output Summary (Last 24 hours) at 12/19/2019 1114 Last data filed at 12/19/2019 9242 Gross per 24 hour  Intake 358 ml  Output 500 ml  Net -142 ml     Physical Exam: Vital Signs Blood pressure (!) 142/67, pulse 100, temperature 98.1 F (36.7 C), resp. rate 16, height 5\' 1"  (1.549 m), weight 86.3 kg, SpO2 99 %. Constitutional: No distress . Vital signs reviewed. HEENT: EOMI, oral membranes moist Neck: supple Cardiovascular: RRR without murmur. No JVD    Respiratory: CTA Bilaterally without wheezes or rales. Normal effort    GI: BS +, non-tender, non-distended  Musculoskeletal:     Comments: Generalized edema LUE and LLE, bruises left hand and legs. Left leg tender with minimal PROM, Low back with mild TTP Neurological: She is alert and oriented to person, place, and time.  Patient is alert and oriented x3 Follows full commands. Motor: RUE: 4-/5 proximal to distal LLE: 3+-4-/5 proximal to distal remains limited by pain LUE: proximally limited by splint, hand grip 2/5 limited by splint/swelling LLE: 2+/5 proximal to distal Sensation diminished to light touch b/le feet  Skin:  Left hip incision with new foam dressing Left upper extremity with sugar tong splint in place. Scattered ecchymoses as above.    Psychiatric: She has a normal mood and  affect. Her behavior is normal.     Assessment/Plan: 1. Functional deficits secondary to polytrauma which require 3+ hours per day of interdisciplinary therapy in a comprehensive inpatient rehab setting.  Physiatrist is providing close team supervision and 24 hour management of active medical problems listed below.  Physiatrist and rehab team continue to assess barriers to discharge/monitor patient progress toward functional and medical goals  Care Tool:  Bathing    Body parts bathed by patient: Left arm, Chest, Abdomen, Front perineal area, Right upper leg, Left upper leg, Face   Body parts bathed by helper: Right arm, Buttocks Body parts n/a: Right lower leg, Left lower leg   Bathing assist Assist Level: Maximal Assistance - Patient 24 - 49%     Upper Body Dressing/Undressing Upper body dressing   What is the patient wearing?: Pull over shirt    Upper body assist Assist Level: Moderate Assistance - Patient 50 - 74%    Lower Body Dressing/Undressing Lower body dressing      What is the patient wearing?: Pants     Lower body assist Assist for lower body dressing: 2 Helpers     Toileting Toileting Toileting Activity did not occur (Clothing management and hygiene only): N/A (no void or bm)  Toileting assist Assist for toileting: Total Assistance - Patient < 25%     Transfers Chair/bed transfer  Transfers assist  Chair/bed transfer activity did not  occur: Safety/medical concerns  Chair/bed transfer assist level: Minimal Assistance - Patient > 75%     Locomotion Ambulation   Ambulation assist   Ambulation activity did not occur: Safety/medical concerns          Walk 10 feet activity   Assist  Walk 10 feet activity did not occur: Safety/medical concerns        Walk 50 feet activity   Assist Walk 50 feet with 2 turns activity did not occur: Safety/medical concerns         Walk 150 feet activity   Assist Walk 150 feet activity did not  occur: Safety/medical concerns         Walk 10 feet on uneven surface  activity   Assist Walk 10 feet on uneven surfaces activity did not occur: Safety/medical concerns         Wheelchair     Assist     Wheelchair activity did not occur: Safety/medical concerns         Wheelchair 50 feet with 2 turns activity    Assist    Wheelchair 50 feet with 2 turns activity did not occur: Safety/medical concerns       Wheelchair 150 feet activity     Assist  Wheelchair 150 feet activity did not occur: Safety/medical concerns       Blood pressure (!) 142/67, pulse 100, temperature 98.1 F (36.7 C), resp. rate 16, height 5\' 1"  (1.549 m), weight 86.3 kg, SpO2 99 %.  Medical Problem List and Plan: 1.  Decreased functional mobility secondary to left displaced femoral neck fracture status post left total hip replacement anterior approach as well as severely left comminuted distal radius fracture status post ORIF 12/08/2019 as well as forearm laceration repair.  Weightbearing as tolerated through left elbow only and weightbearing as tolerated left lower extremity.  No left hip abduction             -patient may  shower if left arm and hip wound covered            -Interdisciplinary Team Conference today              -Continue CIR therapies including PT, OT  2.  Antithrombotics: -DVT/anticoagulation: SCDs.  Dopplers pending             -antiplatelet therapy: Aspirin 325 mg daily 3. Pain Management: Hydrocodone and Robaxin as needed              1/23-add ms contin 15mg  q12 for now d/t significant pain at rest/with activity  1/24 improved pain control  1/25: tylenol scheduled for cocygeal pain  1/26: continued significant low back/left buttock pain. I reviewed her images and saw a left SPR fracture.    -ordered pelvic-lumar xrays to assess for any displacement, examine lower lumbar spine (UPDATE: xray confirms SPR fx, non-displaced. Lumbar films without acute changes.  Continue with current plan)   -increase robaxin to 750mg  and schedule QID 4. Mood: Prozac 40 mg daily             -antipsychotic agents: N/A 5. Neuropsych: This patient is capable of making decisions on her own behalf. 6. Skin/Wound Care: change hip dressing 7. Fluids/Electrolytes/Nutrition: Routine in and outs. CMP ordered. 8.  Polycystic kidney disease status post renal transplant 06/02/2012.  Baseline creatinine 1.02-1.29.  Continue Prograf, chronic prednisone as well as Myfortic.  Followed by Dr. Jimmy Footman            1/25: CMP stable except  for low Na 131 and elevated Cr 1.21. Will repeat tomorrow to trend.    1/26 Sodium unchanged, BUN/Cr continue to drift up   -encourage fluids, hold lasix   -recheck labs again tomorrow 9.  Acute blood loss anemia.  Transfused 2 units packed red blood cells 12/11/2019.               Hgb holding at 9.8 on 1.22, 9.2 on 1/25 10.  Asthma.  Continue inhalers as directed. 11.  History of gout.  Allopurinol daily.  Monitor for any gout flareups 12.  Chronic diastolic congestive heart failure.  Lasix 40 mg daily. Monitor for sign/symptoms of fluid overload.    1/26-?trend, unsure about weights---need more together to assess for trend, she was -20cc for today by I/O's yesterday  -holding lasix at least for tomorrow d/t #8 Filed Weights   12/15/19 1757 12/19/19 0500  Weight: 81.2 kg 86.3 kg    13. Constipation: sorbitol, SSE   if needed  -scheduled senna-s  -large BM 1/23 and 1/24.  LOS: 4 days A FACE TO FACE EVALUATION WAS PERFORMED  Meredith Staggers 12/19/2019, 11:14 AM

## 2019-12-19 NOTE — Progress Notes (Signed)
Occupational Therapy Session Note  Patient Details  Name: April [name] MRN: [id number] Date of Birth: 09/04/1956  Today's Date: 12/19/2019 OT Individual Time: 0905-1000 OT Individual Time Calculation (min): 55 min   Session 2: OT Individual Time: 1405-1420 OT Individual Time Calculation (min): 15 min    Short Term Goals: Week 1:  OT Short Term Goal 1 (Week 1): Pt will sit to stand wiht MOD A of 1 in prep for CM OT Short Term Goal 2 (Week 1): Pt will transfer to w/c or BSC with MAX A of 1 to decrease BOC OT Short Term Goal 3 (Week 1): Pt will thread 1LE into pants with AE PRN OT Short Term Goal 4 (Week 1): Pt will groom with set up  Skilled Therapeutic Interventions/Progress Updates:    Pt received supine with c/o back pain 3/10 willing to get OOB. Pt reporting pain is jumping to a 8-9/10 with activity this session. Breathing techniques cued. Pt completed sit > stand from EOB with min A using PFRW. Min A for stand pivot transfer to w/c. Pt brought to sink where she completed UB bathing and dressing with min A overall. Pt completed oral care with set up assist. Pt reported need for BM and completed stand pivot transfer to the [facility] with min A. High amounts of pain reported. Discussed positioning of recliner with feet up to relieve lower back pressure. Pt unable to void and requesting to return to bed. Pt completed ~5 steps of functional mobility with the [place] with min-mod A to the bed. Mod A to bring LE into bed. Pt was left supine with all needs met, bed alarm set.   Session 2: Pt received supine in high amounts of pain at rest. Pt also stating that x-rays were very painful and she would prefer to not be OOB 2/2 pain. Pt was guided through muscle pumps, opening and closing hand gently with LUE supported in elevation to promote fluid reabsorption and reduce edema. Discussed family education with pt and d/c planning. Pt left supine with all needs met. 30 min missed 2/2 pain.    Therapy  Documentation Precautions:  Precautions Precautions: Fall Required Braces or Orthoses: Sling Restrictions Weight Bearing Restrictions: Yes LUE Weight Bearing: Weight bear through elbow only LLE Weight Bearing: Weight bearing as tolerated Other Position/Activity Restrictions: no L hip active abduction   Therapy/Group: Individual Therapy  Curtis Sites 12/19/2019, 6:45 AM

## 2019-12-19 NOTE — Progress Notes (Addendum)
Social Work Patient ID: April [name], female   DOB: 11/23/1956, 64 y.o.   MRN: [id number]   SW met with pt and pt called her husband while in room to discuss team updates and provide discharge date. Sw informed goals include Min Asst to Supervision and possibly at w/c level with a d/c date of 12/30/2019. Pt recognizes at this time she requires more assistance. Pt states she has a w/c that is new and obtained last yr. SW discussed family education. Husband reports he will speak with his daughter to get her schedule and follow-up with a good date early next week.   *SW received updates from pt reporting pt dtr is available next week: Tuesday after 3pm and anytime Wednesday. SW encouraged pt to discuss with her dtr to plan for therapy on Wednesday to allot enough time to meet with therapists. SW informed pt there will be follow-up to confirm time. SW informed scheduling team on date (2/3).  Loralee Pacas, MSW Office: [phone]

## 2019-12-19 NOTE — Progress Notes (Signed)
Physical Therapy Session Note  Patient Details  Name: April [name] MRN: [id number] Date of Birth: 09/12/1956   Short Term Goals: Week 1:  PT Short Term Goal 1 (Week 1): Pt will initiate transfer training PT Short Term Goal 2 (Week 1): Pt will perform bed mobility w/ max assist PT Short Term Goal 3 (Week 1): Pt will tolerate 60 min of upright activity w/ minimal increase in fatigue  Skilled Therapeutic Interventions/Progress Updates:   Session 1: Pt in supine upon arrival, reports pain is 6/10 however she is shaking and grimacing in pain. Reports pain is in L hip and doesn't think she can move from that position. Provided w/ ice for pain relief. Pt remained in supine and appreciative. Missed 45 min of skilled PT 2/2 pain.   Session 2:  Re-attempted visit in PM, pt remained in bed and pain unchanged from morning attempt. Pt w/ slightly better tolerance to positioning of LLE, however pt's entire body shaking in pain at any attempts of movement. Deferred further activity 2/2 pain. Missed 75 min of skilled PT 2/2 pain.   Therapy Documentation Precautions:  Precautions Precautions: Fall Required Braces or Orthoses: Sling Restrictions Weight Bearing Restrictions: Yes LUE Weight Bearing: Weight bear through elbow only LLE Weight Bearing: Weight bearing as tolerated Other Position/Activity Restrictions: no L hip active abduction Vital Signs: Therapy Vitals Temp: 98.1 F (36.7 C) Pulse Rate: 95 Resp: 17 BP: (!) 142/67 Patient Position (if appropriate): Lying Oxygen Therapy SpO2: 94 %  Therapy/Group: Individual Therapy  Bhavya Eschete Clent Demark 12/19/2019, 7:36 AM

## 2019-12-19 NOTE — Patient Care Conference (Signed)
Inpatient RehabilitationTeam Conference and Plan of Care Update Date: 12/19/2019   Time: 10:05 AM    Patient Name: April [name]      Medical Record Number: [id number]  Date of Birth: 03-14-1956 Sex: Female         Room/Bed: 4W09C/4W09C-01 Payor Info: Payor: [employer] MEDICARE / Plan: [facility] MEDICARE / Product Type: *No Product type* /    Admit Date/Time:  12/15/2019  4:47 PM  Primary Diagnosis:  Left displaced femoral neck fracture [facility])  Patient Active Problem List   Diagnosis Date Noted  . Left displaced femoral neck fracture ([place]) 12/15/2019  . Status post total hip replacement, left   . Post-op pain   . Acute blood loss anemia   . Polycystic kidney   . Closed left hip fracture ([place]) 12/07/2019  . Left wrist fracture 12/07/2019  . Medication management 09/22/2019  . Physical deconditioning 09/22/2019  . Hyponatremia 06/29/2019  . Acute respiratory failure with hypoxia ([place]) 04/19/2019  . Acute on chronic diastolic CHF (congestive heart failure) ([place]) 04/19/2019  . Bilateral closed proximal tibial fracture 12/21/2018  . Closed right ankle fracture 12/21/2018  . Fracture 12/21/2018  . Lower urinary tract infectious disease 10/03/2018  . Asthma, chronic, unspecified asthma severity, with acute exacerbation 10/03/2018  . SIRS (systemic inflammatory response syndrome) ([place]) 10/03/2018  . Nausea vomiting and diarrhea 10/02/2018  . Chronic diastolic CHF (congestive heart failure) ([place]) 10/01/2017  . Influenza B 10/01/2017  . Persistent asthma with status asthmaticus   . Pneumonia 07/15/2016  . Asthma, mild intermittent 07/15/2016  . GERD (gastroesophageal reflux disease) 07/15/2016  . Renal transplant recipient 07/15/2016  . Hyperlipidemia 07/15/2016  . Depression 07/15/2016  . Bronchitis, mucopurulent recurrent ([place]) 08/08/2014  . Chronic cough 07/24/2014  . End stage renal disease ([place]) 03/23/2012  . 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 Alger Simons Social Worker Present: Lennart Pall, LCSW Nurse Present: Isla Pence, RN;Deborah Hervey Ard, RN Case Manager: Karene Fry, RN PT Present: Burnard Bunting, PT OT Present: Laverle Hobby, OT SLP Present: Weston Anna, SLP PPS Coordinator present : Gunnar Fusi, SLP     Current Status/Progress Goal Weekly Team Focus  Bowel/Bladder   Patient is continent of bowel and incontinent at times with bladder  Patient to remain continent of bowel and encourage continence of bladder  Toileting every 2 hours/PRN   Swallow/Nutrition/ Hydration             ADL's   min A UB bathing/dressing, min A transfers, mod-max A LB, max A toileting tasks  Supervision ADLs and transfers  ADL transfers, ADL retraining, L UE edema control, d/c planning   Mobility   mod-max assist bed mobility, min assist stand pivots w/ PFRW, very limited by pain in L hip  supervision, w/c level  OOB tolerance, LLE strengthening/ROM and WB tolerance, transfers and initiating gait   Communication             Safety/Cognition/ Behavioral Observations            Pain   Patient states pain is 3 out of 10 to LUE and LLE  Pain to remain 3 or less out of 10, Give scheduled pain meds  Assess pain every shift/ PRN   Skin   Patinent has ecchymosis to BUE, Skin tear to right arm, and closed incision with staples to left hip  Prevent  further skin breakdown  Assess skin every shift/PRN    Rehab Goals Patient on target to meet rehab goals: Yes *See Care Plan and progress notes for long and short-term goals.     Barriers to Discharge  Current Status/Progress Possible Resolutions Date Resolved   Nursing                  PT  Decreased caregiver support;Weight bearing restrictions  caregivers only able to provide supervision level support              OT Decreased caregiver support;Lack  of/limited family support                SLP                SW                Discharge Planning/Teaching Needs:  Plan is for pt to discharge to home with 24/7 care from her husband (until March) and her adult son (home from college;remote/online edu). Intermittent support from her adult dtr for assistance with errands.  TBD   Team Discussion: Polytrauma, L wrist fx, ORIF wrist, L femur fx, pain, meds adjusted, anxious, monitoring labs, MD to order xrays.  RN BM 1/23, miralax given, staples L hip, yellow hip drainage, norco and robaxin for L hip pain.  OT increased pain today with session, low back pain, min UB B/D, min transfers, mod/max LB ADL, max A toileting, S goals.  PT mod max bed, min stand PFW, S goals w/c level, limited by pain.  Husband recovering from hip fx w/cane.  Son in home, Dtr is an OT, mom in home as well.  Fam ed next week SW to schedule.    Revisions to Treatment Plan: N/A     Medical Summary Current Status: left wrist fracture, left FNF, wound care, pain issues, constipation, anemia Weekly Focus/Goal: improve activity tolerance, pain control, watching labs  Barriers to Discharge: Medical stability       Continued Need for Acute Rehabilitation Level of Care: The patient requires daily medical management by a physician with specialized training in physical medicine and rehabilitation for the following reasons: Direction of a multidisciplinary physical rehabilitation program to maximize functional independence : Yes Medical management of patient stability for increased activity during participation in an intensive rehabilitation regime.: Yes Analysis of laboratory values and/or radiology reports with any subsequent need for medication adjustment and/or medical intervention. : Yes   I attest that I was present, lead the team conference, and concur with the assessment and plan of the team.   Jodell Cipro M 12/19/2019, 4:05 PM   Team conference was held via web/  teleconference due to [place] - 19

## 2019-12-20 ENCOUNTER — Inpatient Hospital Stay (HOSPITAL_COMMUNITY): Payer: Medicare Other | Admitting: Physical Therapy

## 2019-12-20 ENCOUNTER — Inpatient Hospital Stay (HOSPITAL_COMMUNITY): Payer: Medicare Other

## 2019-12-20 LAB — BASIC METABOLIC PANEL
Anion gap: 10 (ref 5–15)
BUN: 41 mg/dL — ABNORMAL HIGH (ref 8–23)
CO2: 26 mmol/L (ref 22–32)
Calcium: 9.4 mg/dL (ref 8.9–10.3)
Chloride: 97 mmol/L — ABNORMAL LOW (ref 98–111)
Creatinine, Ser: 1.27 mg/dL — ABNORMAL HIGH (ref 0.44–1.00)
GFR calc Af Amer: 52 mL/min — ABNORMAL LOW (ref >60)
GFR calc non Af Amer: 45 mL/min — ABNORMAL LOW (ref >60)
Glucose, Bld: 112 mg/dL — ABNORMAL HIGH (ref 70–99)
Potassium: 5 mmol/L (ref 3.5–5.1)
Sodium: 133 mmol/L — ABNORMAL LOW (ref 135–145)

## 2019-12-20 MED ORDER — HYDROCODONE-ACETAMINOPHEN 7.5-325 MG PO TABS
1.0000 | ORAL_TABLET | ORAL | Status: DC | PRN
  Administered 2019-12-20 – 2019-12-25 (×8): 1 via ORAL
  Filled 2019-12-20 (×9): qty 1

## 2019-12-20 NOTE — Progress Notes (Signed)
Occupational Therapy Session Note  Patient Details  Name: April [name] MRN: [id number] Date of Birth: [birth year]    Session 1: Today's Date: 12/20/2019 OT Individual Time: 0352-4818 OT Individual Time Calculation (min): 45 min  OT Missed Time: 30 Minutes Missed Time Reason: Pain   Session 2: OT Individual Time: 1415-1445 OT Individual Time Calculation (min): 30 min   OT Missed Time: 30 Minutes Missed Time Reason: Pain    Short Term Goals: Week 1:  OT Short Term Goal 1 (Week 1): Pt will sit to stand wiht MOD A of 1 in prep for CM OT Short Term Goal 2 (Week 1): Pt will transfer to w/c or BSC with MAX A of 1 to decrease BOC OT Short Term Goal 3 (Week 1): Pt will thread 1LE into pants with AE PRN OT Short Term Goal 4 (Week 1): Pt will groom with set up  Skilled Therapeutic Interventions/Progress Updates:    Pt received supine in bed with high c/o pain at rest. Pt reporting pain "up in the groin around the back" and that she was unable to participate in earlier PT session. Care coordination completed with MD re pt's pain and its' limitation on therapy participation. Pt edu on location of fx and associated pain levels. Pt politely requesting to remain bed level for therapy. Pt held a 3 lb dumbbell in her RUE and a resistive theraband placed around her humerus to adhere to WB precautions in the L wrist as she completed shoulder flexion, cross body reaching, and chest presses. Pt's pain rising and session was terminated 2/2 pain. 30 min missed.   Session 2: Pt received supine in bed with high c/o pain at rest. Pt willing to attempt bed mobility . Mod cueing and mod A overall to come EOB. Pt lasted several seconds before shaking, sweating profusely and in tears re pain. Pt was transferred back to supine with mod A for lifting LLE and guiding trunk. Pt required mod A to reposition in bed comfortable. Pt given cool washcloth and she completed modified UB bathing supine. Pt was left supine with  all needs met. 30 min missed 2/2 pain.   Therapy Documentation Precautions:  Precautions Precautions: Fall Required Braces or Orthoses: Sling Restrictions Weight Bearing Restrictions: Yes LUE Weight Bearing: Weight bear through elbow only LLE Weight Bearing: Weight bearing as tolerated Other Position/Activity Restrictions: no L hip active abduction   Therapy/Group: Individual Therapy  Curtis Sites 12/20/2019, 6:55 AM

## 2019-12-20 NOTE — Progress Notes (Signed)
CT pelvis results show minimally displaced acute fractures of the left superior and inferior pubic rami.  Acute minimally displaced fracture of the left sacrum adjacent to the sacroiliac joint.  Orthopedic services contacted to review films patient currently continue weightbearing as tolerated until otherwise stated by orthopedic services.

## 2019-12-20 NOTE — Progress Notes (Addendum)
[place] PHYSICAL MEDICINE & REHABILITATION PROGRESS NOTE   Subjective/Complaints: Patient says she was in a lot of pain yesterday. Nurse informs that her Robaxin was increased to 750mg  and scheduled yesterday for better pain control. The pain is sharp and stabbing and feels like a muscle spasm.  She is sleeping well at night. Denies constipation.   ROS: Patient denies fever, rash, sore throat, blurred vision, nausea, vomiting, diarrhea, cough, shortness of breath or chest pain,  headache, or mood change.    Objective:   DG Lumbar Spine 2-3 Views  Result Date: 12/19/2019 CLINICAL DATA:  Low back pain. EXAM: LUMBAR SPINE - 2-3 VIEW COMPARISON:  No prior. FINDINGS: Diffuse osteopenia. Diffuse degenerative changes lumbar spine with scoliosis concave right. No acute bony abnormality identified. No evidence of fracture. Surgical clips in the pelvis. Total left hip replacement. IMPRESSION: Diffuse osteopenia degenerative change. Scoliosis lumbar spine concave right. No acute bony abnormality. Electronically Signed   By: Marcello Moores  Register   On: 12/19/2019 14:22   DG Pelvis 1-2 Views  Result Date: 12/19/2019 CLINICAL DATA:  Low back pain, fracture of left superior pubic ramus EXAM: PELVIS - 1-2 VIEW COMPARISON:  12/08/2019 FINDINGS: Generalized osteopenia. Non displaced fracture of the left superior pubic ramus. Left hip arthroplasty. No pelvic bone lesions are seen. IMPRESSION: Nondisplaced fracture of the left superior pubic ramus. Electronically Signed   By: Kathreen Devoid   On: 12/19/2019 14:21   Recent Labs    12/18/19 0719  WBC 12.0*  HGB 9.2*  HCT 29.8*  PLT 228   Recent Labs    12/19/19 0533 12/20/19 0505  NA 131* 133*  K 4.1 5.0  CL 95* 97*  CO2 26 26  GLUCOSE 114* 112*  BUN 44* 41*  CREATININE 1.32* 1.27*  CALCIUM 9.3 9.4    Intake/Output Summary (Last 24 hours) at 12/20/2019 0922 Last data filed at 12/20/2019 0541 Gross per 24 hour  Intake 480 ml  Output 1600 ml  Net  -1120 ml     Physical Exam: Vital Signs Blood pressure 116/65, pulse 87, temperature 97.6 F (36.4 C), resp. rate 16, height 5\' 1"  (1.549 m), weight 85.3 kg, SpO2 94 %. Constitutional: No distress . Vital signs reviewed. Lying in bed. HEENT: EOMI, oral membranes moist Neck: supple Cardiovascular: RRR without murmur. No JVD    Respiratory: CTA Bilaterally without wheezes or rales. Normal effort    GI: BS +, non-tender, non-distended  Musculoskeletal:     Comments: Generalized edema LUE and LLE, bruises left hand and legs. Left leg tender with minimal PROM, Low back with mild TTP Neurological: She is alert and oriented to person, place, and time.  Patient is alert and oriented x3 Follows full commands. Motor: RUE: 4-/5 proximal to distal LLE: 3+-4-/5 proximal to distal remains limited by pain LUE: proximally limited by splint, hand grip 2/5 limited by splint/swelling LLE: 2+/5 proximal to distal Sensation diminished to light touch b/le feet  Skin:  Left hip incision with new foam dressing Left upper extremity with sugar tong splint in place. Scattered ecchymoses as above.    Psychiatric: She has a normal mood and affect. Her behavior is normal.   Assessment/Plan: 1. Functional deficits secondary to polytrauma which require 3+ hours per day of interdisciplinary therapy in a comprehensive inpatient rehab setting.  Physiatrist is providing close team supervision and 24 hour management of active medical problems listed below.  Physiatrist and rehab team continue to assess barriers to discharge/monitor patient progress toward functional and  medical goals  Care Tool:  Bathing    Body parts bathed by patient: Left arm, Chest, Abdomen, Front perineal area, Right upper leg, Left upper leg, Face   Body parts bathed by helper: Right arm, Buttocks Body parts n/a: Right lower leg, Left lower leg   Bathing assist Assist Level: Maximal Assistance - Patient 24 - 49%     Upper Body  Dressing/Undressing Upper body dressing   What is the patient wearing?: Pull over shirt    Upper body assist Assist Level: Moderate Assistance - Patient 50 - 74%    Lower Body Dressing/Undressing Lower body dressing      What is the patient wearing?: Pants     Lower body assist Assist for lower body dressing: 2 Helpers     Toileting Toileting Toileting Activity did not occur (Clothing management and hygiene only): N/A (no void or bm)  Toileting assist Assist for toileting: Total Assistance - Patient < 25%     Transfers Chair/bed transfer  Transfers assist  Chair/bed transfer activity did not occur: Safety/medical concerns  Chair/bed transfer assist level: Minimal Assistance - Patient > 75%     Locomotion Ambulation   Ambulation assist   Ambulation activity did not occur: Safety/medical concerns          Walk 10 feet activity   Assist  Walk 10 feet activity did not occur: Safety/medical concerns        Walk 50 feet activity   Assist Walk 50 feet with 2 turns activity did not occur: Safety/medical concerns         Walk 150 feet activity   Assist Walk 150 feet activity did not occur: Safety/medical concerns         Walk 10 feet on uneven surface  activity   Assist Walk 10 feet on uneven surfaces activity did not occur: Safety/medical concerns         Wheelchair     Assist     Wheelchair activity did not occur: Safety/medical concerns         Wheelchair 50 feet with 2 turns activity    Assist    Wheelchair 50 feet with 2 turns activity did not occur: Safety/medical concerns       Wheelchair 150 feet activity     Assist  Wheelchair 150 feet activity did not occur: Safety/medical concerns       Blood pressure 116/65, pulse 87, temperature 97.6 F (36.4 C), resp. rate 16, height 5\' 1"  (1.549 m), weight 85.3 kg, SpO2 94 %.  Medical Problem List and Plan: 1.  Decreased functional mobility secondary to left  displaced femoral neck fracture status post left total hip replacement anterior approach as well as severely left comminuted distal radius fracture status post ORIF 12/08/2019 as well as forearm laceration repair.  Weightbearing as tolerated through left elbow only and weightbearing as tolerated left lower extremity.  No left hip abduction             -patient may  shower if left arm and hip wound covered            -Interdisciplinary Team Conference today              -Continue CIR therapies including PT, OT  2.  Antithrombotics: -DVT/anticoagulation: SCDs.  Dopplers pending             -antiplatelet therapy: Aspirin 325 mg daily 3. Pain Management: Hydrocodone and Robaxin as needed  1/23-add ms contin 15mg  q12 for now d/t significant pain at rest/with activity  1/24 improved pain control  1/25: tylenol scheduled for cocygeal pain  1/26: continued significant low back/left buttock pain. I reviewed her images and saw a left SPR fracture.    -ordered pelvic-lumar xrays to assess for any displacement, examine lower lumbar spine (UPDATE: xray confirms SPR fx, non-displaced. Lumbar films without acute changes. Continue with current plan)   -increase robaxin to 750mg  and schedule QID  1/27: Patient, nurse, and I discussed pain regimen this morning. She asked for Norco twice yesterday. Nurse feels that pain escalates when she does not receive the medication regularly. She will communicate with night RN to administer Norco when patient wakes with pain at night and day nurse to administer in morning to make sure she is getting Norco more regularly. During OT session with Katharine Look, OT notified me that was patient was in more pain than usual and unable to tolerate therapy session. Usually she pushes through pain but she is unable to today and also missed 75 min of PT and 30 min OT yesterday.  Pain is worst at site of superior pubic ramus fracture. Patient shown picture of where pubic ramus is located  and discussed that it can take some time for fracture to heal. Given severity and worsening of pain will order CT of pelvis to assess for additional pelvic ring fractures that may not be assessed on XR and will increase Norco to 7.5mg  1-2 tablets q4H PRN. 4. Mood: Prozac 40 mg daily             -antipsychotic agents: N/A 5. Neuropsych: This patient is capable of making decisions on her own behalf. 6. Skin/Wound Care: change hip dressing 7. Fluids/Electrolytes/Nutrition: Routine in and outs. CMP ordered. 8.  Polycystic kidney disease status post renal transplant 06/02/2012.  Baseline creatinine 1.02-1.29.  Continue Prograf, chronic prednisone as well as Myfortic.  Followed by Dr. Jimmy Footman            1/25: CMP stable except for low Na 131 and elevated Cr 1.21. Will repeat tomorrow to trend.    1/26 Sodium unchanged, BUN/Cr continue to drift up   -encourage fluids, hold lasix   -Na increased to 133 9.  Acute blood loss anemia.  Transfused 2 units packed red blood cells 12/11/2019.               Hgb holding at 9.8 on 1.22, 9.2 on 1/25 10.  Asthma.  Continue inhalers as directed. 11.  History of gout.  Allopurinol daily.  Monitor for any gout flareups 12.  Chronic diastolic congestive heart failure.  Lasix 40 mg daily. Monitor for sign/symptoms of fluid overload.    1/26-?trend, unsure about weights---need more together to assess for trend, she was -20cc for today by I/O's yesterday  -holding lasix at least for tomorrow d/t #8  1/27: weight down.  Filed Weights   12/15/19 1757 12/19/19 0500 12/20/19 0500  Weight: 81.2 kg 86.3 kg 85.3 kg    13. Constipation: sorbitol, SSE   if needed  -scheduled senna-s  -large BM 1/23 and 1/24.  LOS: 5 days A FACE TO FACE EVALUATION WAS PERFORMED  Neela Zecca P Beyonca Wisz 12/20/2019, 9:22 AM

## 2019-12-20 NOTE — Progress Notes (Signed)
Physical Therapy Session Note  Patient Details  Name: April [name] MRN: [id number] Date of Birth: Jun 25, 1956  Today's Date: 12/20/2019 PT Individual Time: 0905-0930 PT Individual Time Calculation (min): 25 min  and Today's Date: 12/20/2019 PT Missed Time: 35 min Missed Time Reason: Pain  Short Term Goals: Week 1:  PT Short Term Goal 1 (Week 1): Pt will initiate transfer training PT Short Term Goal 2 (Week 1): Pt will perform bed mobility w/ max assist PT Short Term Goal 3 (Week 1): Pt will tolerate 60 min of upright activity w/ minimal increase in fatigue  Skilled Therapeutic Interventions/Progress Updates:   Pt in supine and agreeable to attempt therapy today. Pain 6/10 in L anterior hip. Set-up assist to void in urinal, pt continent of void w/ extra time and placing bed in forward tilt position to assist w/ voiding. Supine>sit w/ min assist and tactile/verbal cues for log-roll. Most assist needed at [place] for bracing during transitioning to seated at EOB. Pt immediately yelling out in pain at L hip. Stating it was now 7/10 and grimacing. Rested at EOB for 2-3 min w/ verbal and visual cues for breathing strategies w/ some improvement in pain. Agreeable to attempt standing but pt very fearful and apprehensive. Sit>stand w/ max assist to RW and not able to achieve full upright 2/2 pain. Pt yelling out in pain at L hip and grimacing/shaking. Returned to seated and total assist back to supine. Pt unable to tolerate further activity 2/2 pain and very apologetic. Reassurance provided and encouragement to continue attempting therapies in general despite pain levels. Ended session in supine, all needs in reach. Pt not due for any pain medication at this time but propped up LLE for pain relief. Missed 35 min of skilled PT 2/2 pain.   Therapy Documentation Precautions:  Precautions Precautions: Fall Required Braces or Orthoses: Sling Restrictions Weight Bearing Restrictions: Yes LUE Weight  Bearing: Weight bear through elbow only LLE Weight Bearing: Weight bearing as tolerated Other Position/Activity Restrictions: no L hip active abduction Vital Signs: Therapy Vitals Pulse Rate: 87 Resp: 16 Patient Position (if appropriate): Lying Oxygen Therapy SpO2: 94 % O2 Device: Room Air Pain: Pain Assessment Pain Scale: 0-10(premedicated prior to therapy) Pain Score: 3  Pain Location: Hip Pain Orientation: Left Pain Radiating Towards: pelvis Pain Descriptors / Indicators: Sharp;Stabbing Pain Onset: With Activity Pain Intervention(s): Medication (See eMAR)  Therapy/Group: Individual Therapy  Francis Yardley K Bilal Manzer 12/20/2019, 11:03 AM

## 2019-12-21 ENCOUNTER — Inpatient Hospital Stay (HOSPITAL_COMMUNITY): Payer: Medicare Other

## 2019-12-21 ENCOUNTER — Inpatient Hospital Stay (HOSPITAL_COMMUNITY): Payer: Medicare Other | Admitting: Physical Therapy

## 2019-12-21 MED ORDER — MORPHINE SULFATE ER 15 MG PO TBCR
15.0000 mg | EXTENDED_RELEASE_TABLET | Freq: Once | ORAL | Status: AC
  Administered 2019-12-21: 15 mg via ORAL
  Filled 2019-12-21: qty 1

## 2019-12-21 MED ORDER — MORPHINE SULFATE ER 15 MG PO TBCR
30.0000 mg | EXTENDED_RELEASE_TABLET | Freq: Two times a day (BID) | ORAL | Status: DC
  Administered 2019-12-21 – 2019-12-25 (×9): 30 mg via ORAL
  Administered 2019-12-26: 09:00:00 15 mg via ORAL
  Filled 2019-12-21 (×10): qty 2

## 2019-12-21 NOTE — Progress Notes (Signed)
Physical Therapy Session Note  Patient Details  Name: April [name] MRN: [id number] Date of Birth: 13-Feb-1956  Today's Date: 12/21/2019 PT Individual Time: 1300-1340 PT Individual Time Calculation (min): 40 min  and Today's Date: 12/21/2019 PT Missed Time: 60 Minutes Missed Time Reason: Pain  Short Term Goals: Week 1:  PT Short Term Goal 1 (Week 1): Pt will initiate transfer training PT Short Term Goal 2 (Week 1): Pt will perform bed mobility w/ max assist PT Short Term Goal 3 (Week 1): Pt will tolerate 60 min of upright activity w/ minimal increase in fatigue  Skilled Therapeutic Interventions/Progress Updates:   Session 1:  Attempted to see pt in morning for scheduled session. Pt reporting she couldn't participate 2/2 L hip pain. Made RN aware of pain level. Missed 60 min of skilled PT 2/2 pain.   Session 2:  Pt in supine and agreeable to attempt bed level exercises, pain remains 4-5/10 in L hip at rest and pt intermittently grimacing. Pt also reporting low back pain but denies assistance to turn on side, states it hurts more than when in supine. Pt attempting to eat lunch but unable in current bed position. Elevated bed to more of a chair position w/ improved success.   Worked on gentle LLE ROM/strengthening while eating lunch including quad set 4x5, ankle pump 3x5, and knee march 1x5.   Educated on pain relief strategies including pursed-lip breathing, hooklying position to keep pressure off hips and back, and progressive muscle relaxation. Pt w/ multiple episodes of L hamstring spasms throughout session. Therapist providing verbal cues for breathing and relaxation throughout the spasm episode, heat and gentle trigger point massage provided to L distal HS as well w/ pain relief.   Ended session in hooklying position w/ LLE propped on pillow, all needs in reach. Pt states this is the most relaxed she has felt in days, appreciative of therapist's assistance.   Therapy  Documentation Precautions:  Precautions Precautions: Fall Required Braces or Orthoses: Sling Restrictions Weight Bearing Restrictions: Yes LUE Weight Bearing: Weight bear through elbow only LLE Weight Bearing: Weight bearing as tolerated Other Position/Activity Restrictions: no L hip active abduction Vital Signs: Therapy Vitals Temp: 98.4 F (36.9 C) Temp Source: Oral Pulse Rate: 89 Resp: 18 BP: (!) 123/57 Patient Position (if appropriate): Lying Oxygen Therapy SpO2: 90 % O2 Device: Room Air Therapy/Group: Individual Therapy  Jennea Rager K Kash [place] 12/21/2019, 1:40 PM

## 2019-12-21 NOTE — Progress Notes (Signed)
Occupational Therapy Session Note  Patient Details  Name: April [name] MRN: [id number] Date of Birth: 12/30/1955  Today's Date: 12/21/2019 OT Individual Time: 1520-1544 OT Individual Time Calculation (min): 24 min    Short Term Goals: Week 1:  OT Short Term Goal 1 (Week 1): Pt will sit to stand wiht MOD A of 1 in prep for CM OT Short Term Goal 2 (Week 1): Pt will transfer to w/c or BSC with MAX A of 1 to decrease BOC OT Short Term Goal 3 (Week 1): Pt will thread 1LE into pants with AE PRN OT Short Term Goal 4 (Week 1): Pt will groom with set up  Skilled Therapeutic Interventions/Progress Updates:    1:1. Pt reporting pain "better than this morning" but does not rate while in supine. Pt reporting need to void bladder but declines getting up to [facility], Urinal used in bed and pt positioned with cuing for comfort. Pt agreeable to attempt to sit EOB, however after sitting up 85% of way pt states, "this just hurts too bad, I need to lay back down." Pt frustrated with progress and feels like, "something has to have happened for me to be going backwards [pain/progress]." Provided support and encouragement and empowerment to continue to advocate for self with medical team. Discussed alternative pain management strategies to explore on other days. Exited session with pt seated in bed, exit alarm on and call light in reach  Therapy Documentation Precautions:  Precautions Precautions: Fall Required Braces or Orthoses: Sling Restrictions Weight Bearing Restrictions: Yes LUE Weight Bearing: Weight bear through elbow only LLE Weight Bearing: Weight bearing as tolerated Other Position/Activity Restrictions: no L hip active abduction General: General PT Missed Treatment Reason: Pain Vital Signs: Therapy Vitals Temp: 98.4 F (36.9 C) Temp Source: Oral Pulse Rate: 89 Resp: 18 BP: (!) 123/57 Patient Position (if appropriate): Lying Oxygen Therapy SpO2: 90 % O2 Device: Room Air Pain:    ADL: ADL Grooming: Minimal assistance Where Assessed-Grooming: Edge of bed Upper Body Bathing: Minimal assistance Where Assessed-Upper Body Bathing: Edge of bed Lower Body Bathing: Maximal assistance Where Assessed-Lower Body Bathing: Edge of bed Upper Body Dressing: Supervision/safety Where Assessed-Upper Body Dressing: Edge of bed Lower Body Dressing: (2 helper sit to stand) Where Assessed-Lower Body Dressing: Edge of bed Vision   Perception    Praxis   Exercises:   Other Treatments:     Therapy/Group: Individual Therapy  Tonny Branch 12/21/2019, 3:48 PM

## 2019-12-21 NOTE — Progress Notes (Signed)
Occupational Therapy Session Note  Patient Details  Name: April [name] MRN: [id number] Date of Birth: 07/02/1956  Today's Date: 12/21/2019 OT Individual Time: 1000-1045 OT Individual Time Calculation (min): 45 min    Short Term Goals: Week 1:  OT Short Term Goal 1 (Week 1): Pt will sit to stand wiht MOD A of 1 in prep for CM OT Short Term Goal 2 (Week 1): Pt will transfer to w/c or BSC with MAX A of 1 to decrease BOC OT Short Term Goal 3 (Week 1): Pt will thread 1LE into pants with AE PRN OT Short Term Goal 4 (Week 1): Pt will groom with set up  Skilled Therapeutic Interventions/Progress Updates:    1:1. Pt received in room in 9/10 pain and declining getting EOB or OOB. Pt agreeable to grooming and only tolerating sitting with HOB 26* elevated. Pt grooms with MIN A for toothpaste application. Pt agreeable to UB therex at bed level. Pt completes alternating squeezes with foam block in LUE with wrsit elevated for edema management and 3# DB therex for RUE: shoulder flex/ext, ab/adduciton, horizontal ab/adduct, elbow flex/ext, and chest press. Exited session with pt seated in bed, exit alarm on and call light in reach  Therapy Documentation Precautions:  Precautions Precautions: Fall Required Braces or Orthoses: Sling Restrictions Weight Bearing Restrictions: Yes LUE Weight Bearing: Weight bear through elbow only LLE Weight Bearing: Weight bearing as tolerated Other Position/Activity Restrictions: no L hip active abduction General:   Vital Signs: Oxygen Therapy SpO2: 90 % O2 Device: Room Air Pain: Pain Assessment Pain Scale: 0-10 Pain Score: 5  Pain Type: Acute pain Pain Location: Hip Pain Orientation: Right;Left Pain Descriptors / Indicators: Aching;Sharp Pain Frequency: Constant Pain Onset: On-going Patients Stated Pain Goal: 1 Pain Intervention(s): Medication (See eMAR) ADL: ADL Grooming: Minimal assistance Where Assessed-Grooming: Edge of bed Upper Body Bathing:  Minimal assistance Where Assessed-Upper Body Bathing: Edge of bed Lower Body Bathing: Maximal assistance Where Assessed-Lower Body Bathing: Edge of bed Upper Body Dressing: Supervision/safety Where Assessed-Upper Body Dressing: Edge of bed Lower Body Dressing: (2 helper sit to stand) Where Assessed-Lower Body Dressing: Edge of bed Vision   Perception    Praxis   Exercises:   Other Treatments:     Therapy/Group: Individual Therapy  Tonny Branch 12/21/2019, 10:19 AM

## 2019-12-21 NOTE — Progress Notes (Signed)
[place] PHYSICAL MEDICINE & REHABILITATION PROGRESS NOTE   Subjective/Complaints: Still having a lot of pain. Having difficulty even moving bed let alone tyring to put weight on LLE. Doesn't have much appetite d/t pain  ROS: Patient denies fever, rash, sore throat, blurred vision, nausea, vomiting, diarrhea, cough, shortness of breath or chest pain,  headache, or mood change.     Objective:   DG Lumbar Spine 2-3 Views  Result Date: 12/19/2019 CLINICAL DATA:  Low back pain. EXAM: LUMBAR SPINE - 2-3 VIEW COMPARISON:  No prior. FINDINGS: Diffuse osteopenia. Diffuse degenerative changes lumbar spine with scoliosis concave right. No acute bony abnormality identified. No evidence of fracture. Surgical clips in the pelvis. Total left hip replacement. IMPRESSION: Diffuse osteopenia degenerative change. Scoliosis lumbar spine concave right. No acute bony abnormality. Electronically Signed   By: Marcello Moores  Register   On: 12/19/2019 14:22   DG Pelvis 1-2 Views  Result Date: 12/19/2019 CLINICAL DATA:  Low back pain, fracture of left superior pubic ramus EXAM: PELVIS - 1-2 VIEW COMPARISON:  12/08/2019 FINDINGS: Generalized osteopenia. Non displaced fracture of the left superior pubic ramus. Left hip arthroplasty. No pelvic bone lesions are seen. IMPRESSION: Nondisplaced fracture of the left superior pubic ramus. Electronically Signed   By: Kathreen Devoid   On: 12/19/2019 14:21   CT PELVIS WO CONTRAST  Result Date: 12/20/2019 CLINICAL DATA:  Follow-up x-ray to evaluate possible pelvic fracture. EXAM: CT PELVIS WITHOUT CONTRAST TECHNIQUE: Multidetector CT imaging of the pelvis was performed following the standard protocol without intravenous contrast. 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 Transplant kidney over the right lower quadrant/upper pelvis unchanged. Bladder is normal. Bowel: Visualized large and  small bowel are normal. Appendix is normal. Vascular/Lymphatic: Normal. Reproductive:  Normal. Other: Small umbilical hernia containing only peritoneal fat unchanged. Musculoskeletal: Evidence of patient's recent left hip arthroplasty intact. Subtle fracture involving the left side of the sacrum adjacent the sacroiliac joint. There is a mildly displaced acute fracture of the midportion of the left inferior pubic ramus. There is also a minimally displaced fracture of the left superior pubic ramus extending towards the symphysis. Fragmentation of the left greater trochanter which may be part patient's recent acute left hip fracture versus acute re-injury. Soft tissue swelling over the left hip. IMPRESSION: 1. Minimally displaced acute fractures of the left superior and inferior pubic rami. 2. Acute minimally displaced fracture of the left sacrum adjacent the sacroiliac joint. 3. Fragmentation of the left greater trochanter which may be part of patient's known recent acute left femoral neck fracture versus acute re-injury. Recent left hip arthroplasty otherwise intact. Electronically Signed   By: Marin Olp M.D.   On: 12/20/2019 14:13   DG HIP UNILAT WITH PELVIS 2-3 VIEWS LEFT  Result Date: 12/21/2019 CLINICAL DATA:  Left hip pain. EXAM: DG HIP (WITH OR WITHOUT PELVIS) 2-3V LEFT COMPARISON:  CT from yesterday FINDINGS: Total left hip arthroplasty with cerclage wire. Unchanged alignment of a greater trochanter base fracture. Unchanged alignment of a left superior and inferior pubic ramus fracture. Clips over the right hemipelvis. Osteopenia. IMPRESSION: Unchanged alignment of left obturator ring and greater trochanter fractures. Electronically Signed   By: Monte Fantasia M.D.   On: 12/21/2019 10:07   No results for input(s): WBC, HGB, HCT, PLT in the last 72 hours. Recent Labs    12/19/19 0533 12/20/19 0505  NA 131* 133*  K 4.1 5.0  CL 95* 97*  CO2 26  26  GLUCOSE 114* 112*  BUN 44* 41*  CREATININE  1.32* 1.27*  CALCIUM 9.3 9.4    Intake/Output Summary (Last 24 hours) at 12/21/2019 1046 Last data filed at 12/21/2019 0857 Gross per 24 hour  Intake 600 ml  Output 725 ml  Net -125 ml     Physical Exam: Vital Signs Blood pressure (!) 129/53, pulse 88, temperature 98 F (36.7 C), temperature source Oral, resp. rate 18, height 5\' 1"  (1.549 m), weight 85 kg, SpO2 90 %. Constitutional: appears uncomfortable . Vital signs reviewed. HEENT: EOMI, oral membranes moist Neck: supple Cardiovascular: RRR without murmur. No JVD    Respiratory: CTA Bilaterally without wheezes or rales. Normal effort    GI: BS +, non-tender, non-distended  Musculoskeletal:     Comments: Generalized edema LUE (improving) and LLE, bruises left hand and legs. Left leg tender with minimal PROM, Low back with mild TTP Neurological: She is alert and oriented to person, place, and time.  Patient is alert and oriented x3 Follows full commands. Motor: RUE: 4-/5 proximal to distal LLE: 1-4/5 limited proximally by pain LUE: proximally limited by splint, hand grip 2/5 limited by splint/swelling LLE: 2+/5 proximal to distal Sensation diminished to light touch b/le feet  Skin:  Left hip incision with dry dressing, mild serous drainage, staples Left upper extremity with sugar tong splint in place. Scattered ecchymoses as above.    Psychiatric: flat  Assessment/Plan: 1. Functional deficits secondary to polytrauma which require 3+ hours per day of interdisciplinary therapy in a comprehensive inpatient rehab setting.  Physiatrist is providing close team supervision and 24 hour management of active medical problems listed below.  Physiatrist and rehab team continue to assess barriers to discharge/monitor patient progress toward functional and medical goals  Care Tool:  Bathing    Body parts bathed by patient: Left arm, Chest, Abdomen, Front perineal area, Right upper leg, Left upper leg, Face   Body parts bathed by  helper: Right arm, Buttocks Body parts n/a: Right lower leg, Left lower leg   Bathing assist Assist Level: Maximal Assistance - Patient 24 - 49%     Upper Body Dressing/Undressing Upper body dressing   What is the patient wearing?: Pull over shirt    Upper body assist Assist Level: Moderate Assistance - Patient 50 - 74%    Lower Body Dressing/Undressing Lower body dressing      What is the patient wearing?: Pants     Lower body assist Assist for lower body dressing: 2 Helpers     Toileting Toileting Toileting Activity did not occur (Clothing management and hygiene only): N/A (no void or bm)  Toileting assist Assist for toileting: Total Assistance - Patient < 25%     Transfers Chair/bed transfer  Transfers assist  Chair/bed transfer activity did not occur: Safety/medical concerns  Chair/bed transfer assist level: Minimal Assistance - Patient > 75%     Locomotion Ambulation   Ambulation assist   Ambulation activity did not occur: Safety/medical concerns          Walk 10 feet activity   Assist  Walk 10 feet activity did not occur: Safety/medical concerns        Walk 50 feet activity   Assist Walk 50 feet with 2 turns activity did not occur: Safety/medical concerns         Walk 150 feet activity   Assist Walk 150 feet activity did not occur: Safety/medical concerns         Walk 10 feet on  uneven surface  activity   Assist Walk 10 feet on uneven surfaces activity did not occur: Safety/medical Armed forces technical officer activity did not occur: Safety/medical concerns         Wheelchair 50 feet with 2 turns activity    Assist    Wheelchair 50 feet with 2 turns activity did not occur: Safety/medical concerns       Wheelchair 150 feet activity     Assist  Wheelchair 150 feet activity did not occur: Safety/medical concerns       Blood pressure (!) 129/53, pulse 88, temperature 98  F (36.7 C), temperature source Oral, resp. rate 18, height 5\' 1"  (1.549 m), weight 85 kg, SpO2 90 %.  Medical Problem List and Plan: 1.  Decreased functional mobility secondary to left displaced femoral neck fracture status post left total hip replacement anterior approach as well as severely left comminuted distal radius fracture status post ORIF 12/08/2019 as well as forearm laceration repair.  Weightbearing as tolerated through left elbow only and weightbearing as tolerated left lower extremity.  No left hip abduction             -patient may  shower if left arm and hip wound covered            -limited by pain              -Continue CIR therapies including PT, OT  2.  Antithrombotics: -DVT/anticoagulation: SCDs.  Dopplers pending             -antiplatelet therapy: Aspirin 325 mg daily 3. Pain Management: Hydrocodone and Robaxin as needed              1/23-add ms contin 15mg  q12 for now d/t significant pain at rest/with activity  1/24 improved pain control  1/25: tylenol scheduled for cocygeal pain  1/26: continued significant low back/left buttock pain. I reviewed her images and saw a left SPR fracture.    -ordered pelvic-lumar xrays to assess for any displacement, examine lower lumbar spine (UPDATE: xray confirms SPR fx, non-displaced. Lumbar films without acute changes. Continue with current plan)   -increase robaxin to 750mg  and schedule QID  1/27: norco increased, CT ordered which revealed SPR as well IPR and left lateral sacral fx  1/28: ortho recs weight bearing as tolerated for now.   -increase ms contin to 30mg  q12 4. Mood: Prozac 40 mg daily             -antipsychotic agents: N/A 5. Neuropsych: This patient is capable of making decisions on her own behalf. 6. Skin/Wound Care: change hip dressing 7. Fluids/Electrolytes/Nutrition: Routine in and outs. CMP ordered. 8.  Polycystic kidney disease status post renal transplant 06/02/2012.  Baseline creatinine 1.02-1.29.  Continue  Prograf, chronic prednisone as well as Myfortic.  Followed by Dr. Jimmy Footman            1/25: CMP stable except for low Na 131 and elevated Cr 1.21. Will repeat tomorrow to trend.    1/26 Sodium unchanged, BUN/Cr continue to drift up   -encourage fluids, hold lasix   -Na increased to 133 9.  Acute blood loss anemia.  Transfused 2 units packed red blood cells 12/11/2019.               Hgb holding at 9.8 on 1.22, 9.2 on 1/25 10.  Asthma.  Continue inhalers as directed. 11.  History of gout.  Allopurinol daily.  Monitor for any gout flareups 12.  Chronic diastolic congestive heart failure.  Lasix 40 mg daily. Monitor for sign/symptoms of fluid overload.    1/26-?trend, unsure about weights---need more together to assess for trend, she was -20cc for today by I/O's yesterday  -holding lasix at least for tomorrow d/t #8  1/28 weight stable Filed Weights   12/19/19 0500 12/20/19 0500 12/21/19 0500  Weight: 86.3 kg 85.3 kg 85 kg    13. Constipation: sorbitol, SSE   if needed  -scheduled senna-s  -large BM 1/27  LOS: 6 days A FACE TO FACE EVALUATION WAS PERFORMED  Meredith Staggers 12/21/2019, 10:46 AM

## 2019-12-21 NOTE — Progress Notes (Signed)
Social Work Patient ID: Deloris Ping, female   DOB: October 19, 1956, 64 y.o.   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 SW indicated will follow-up once able to get schedule information.   Pt placed on schedule for family education for Tuesday, Feb 2 9am-10am and 10:30am-11:30am. SW called pt dtr [name] to inform. She reports both she and her father will be coming in on Tuesday for education.    Loralee Pacas, MSW Office: [phone] Cell: [phone]

## 2019-12-22 ENCOUNTER — Inpatient Hospital Stay (HOSPITAL_COMMUNITY): Payer: Medicare Other | Admitting: Physical Therapy

## 2019-12-22 ENCOUNTER — Inpatient Hospital Stay (HOSPITAL_COMMUNITY): Payer: Medicare Other

## 2019-12-22 MED ORDER — METHOCARBAMOL 500 MG PO TABS
500.0000 mg | ORAL_TABLET | Freq: Four times a day (QID) | ORAL | Status: DC | PRN
  Administered 2019-12-22 – 2020-01-04 (×13): 500 mg via ORAL
  Filled 2019-12-22 (×13): qty 1

## 2019-12-22 MED ORDER — ENOXAPARIN SODIUM 40 MG/0.4ML ~~LOC~~ SOLN
40.0000 mg | SUBCUTANEOUS | Status: DC
  Administered 2019-12-22 – 2020-01-05 (×15): 40 mg via SUBCUTANEOUS
  Filled 2019-12-22 (×15): qty 0.4

## 2019-12-22 NOTE — Progress Notes (Signed)
Physical Therapy Session Note  Patient Details  Name: April [name] MRN: [id number] Date of Birth: 1956/01/28  Today's Date: 12/22/2019 PT Individual Time: 1300-1335 PT Individual Time Calculation (min): 35 min   Short Term Goals: Week 1:  PT Short Term Goal 1 (Week 1): Pt will initiate transfer training PT Short Term Goal 1 - Progress (Week 1): Met PT Short Term Goal 2 (Week 1): Pt will perform bed mobility w/ max assist PT Short Term Goal 2 - Progress (Week 1): Met PT Short Term Goal 3 (Week 1): Pt will tolerate 60 min of upright activity w/ minimal increase in fatigue PT Short Term Goal 3 - Progress (Week 1): Met Week 2:  PT Short Term Goal 1 (Week 2): =LTGs due to ELOS  Skilled Therapeutic Interventions/Progress Updates:    Pt reporting not being able to "do anything" due to pain levels. Educated on pain cycle and discussed non-medicated options for pain relief and pt reporting the pain medication is not really making a difference with mobility. She also states that she feels a bit loopy and groggy right now from medication she had and in the "twilight zone." emotional support provided throughout session but also offered her to have realistic pain level expectations as some amount of pain is going to be normal. Focused on education and practice of diaphragmatic breathing and exhaling on exertion. Had patient perform 10 reps in semireclined position with hand over belly and cues for slower rate. Then attempted while performing minimal ROM for active assisted heel slides on R and L with exhaling on exertion. On initial repetitions pt with grimaced face but as progressed after 5 trials, pt able to perform (within very limited range) with decreased pain level. Encouraged use of incentive spirometer as well for a visual feedback of breath control. Encouraged to attempt this next time she is performing bed mobility (eventually progressing to upright mobility).   Therapy  Documentation Precautions:  Precautions Precautions: Fall Required Braces or Orthoses: Sling Restrictions Weight Bearing Restrictions: Yes LUE Weight Bearing: Weight bear through elbow only LLE Weight Bearing: Weight bearing as tolerated Other Position/Activity Restrictions: no L hip active abduction Pain: Ok at rest but increases to being unbearable with attempted mobility. Pt did not rate but grimaces.   Therapy/Group: Individual Therapy  Canary Brim Ivory Broad, PT, DPT, CBIS  12/22/2019, 2:20 PM

## 2019-12-22 NOTE — Progress Notes (Signed)
Physical Therapy Weekly Progress Note  Patient Details  Name: April [name] MRN: [id number] Date of Birth: Apr 05, 1956  Beginning of progress report period: December 16, 2019 End of progress report period: December 22, 2019  Today's Date: 12/22/2019 PT Individual Time: 2500-3704 PT Individual Time Calculation (min): 20 min   Patient has met 3 of 3 short term goals. Though pt is making progress towards LTGs, she continues to be significantly limited by pain. Her pain has progressed since evaluation as she now does not tolerate sitting EOB. She is performing bed mobility anywhere from min-max assist depending on level of pain and is able to perform stand pivot transfers w/ min assist using PFRW when tolerable.   Patient continues to demonstrate the following deficits muscle weakness and muscle joint tightness, decreased cardiorespiratoy endurance and decreased standing balance and decreased balance strategies and therefore will continue to benefit from skilled PT intervention to increase functional independence with mobility.  Patient progressing toward long term goals..  Continue plan of care. Will continue to work towards supervision goals as this is the only extent her family can provide caregiver assistance at home. However, may need to adjust d/c date should her pain continue to significantly limit her participation in therapy. Will plan to re-evaluate w/ treatment team at conference on Tuesday.   PT Short Term Goals Week 1:  PT Short Term Goal 1 (Week 1): Pt will initiate transfer training PT Short Term Goal 1 - Progress (Week 1): Met PT Short Term Goal 2 (Week 1): Pt will perform bed mobility w/ max assist PT Short Term Goal 2 - Progress (Week 1): Met PT Short Term Goal 3 (Week 1): Pt will tolerate 60 min of upright activity w/ minimal increase in fatigue PT Short Term Goal 3 - Progress (Week 1): Met Week 2:  PT Short Term Goal 1 (Week 2): =LTGs due to ELOS  Skilled Therapeutic  Interventions/Progress Updates:   Pt received in supine, pain 5-6/10 in L hip and lower back. Pt agreeable to attempt OOB activity but states pain is unchanged from yesterday and previous therapy attempts. Supine>sit w/ min-mod assist for LLE management. Once OOB, pt stating 10/10 pain in L hip and pt yelling out and grimacing in pain. Agreeable to stay up long enough to change shirts as one she was wearing was soaked with sweat. Total assist to do so 2/2 pain. Therapist also washed off her back and underarms w/ cold wash cloth to remove sweat. Donned shirt and returned to supine w/ mod assist. Total assist to pull up in bed. Ended session in hooklying position w/ mild decrease in pain. All needs in reach. Missed 25 min of skilled PT 2/2 pain.   Therapy Documentation Precautions:  Precautions Precautions: Fall Required Braces or Orthoses: Sling Restrictions Weight Bearing Restrictions: Yes LUE Weight Bearing: Weight bear through elbow only LLE Weight Bearing: Weight bearing as tolerated Other Position/Activity Restrictions: no L hip active abduction  Therapy/Group: Individual Therapy  Khayman Kirsch Clent Demark 12/22/2019, 11:34 AM

## 2019-12-22 NOTE — Progress Notes (Addendum)
[place] PHYSICAL MEDICINE & REHABILITATION PROGRESS NOTE   Subjective/Complaints: Struggling with pain especially with WB LLE. Admits to being groggy from medications. Not much of an appetite. Says she doesn't always get help cutting up foods  ROS: Patient denies fever, rash, sore throat, blurred vision, nausea, vomiting, diarrhea, cough, shortness of breath or chest pain, headache, or mood change.        Objective:   CT PELVIS WO CONTRAST  Result Date: 12/20/2019 CLINICAL DATA:  Follow-up x-ray to evaluate possible pelvic fracture. EXAM: CT PELVIS WITHOUT CONTRAST TECHNIQUE: Multidetector CT imaging of the pelvis was performed following the standard protocol without intravenous contrast. 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 Transplant kidney over the right lower quadrant/upper pelvis unchanged. Bladder is normal. Bowel: Visualized large and small bowel are normal. Appendix is normal. Vascular/Lymphatic: Normal. Reproductive:  Normal. Other: Small umbilical hernia containing only peritoneal fat unchanged. Musculoskeletal: Evidence of patient's recent left hip arthroplasty intact. Subtle fracture involving the left side of the sacrum adjacent the sacroiliac joint. There is a mildly displaced acute fracture of the midportion of the left inferior pubic ramus. There is also a minimally displaced fracture of the left superior pubic ramus extending towards the symphysis. Fragmentation of the left greater trochanter which may be part patient's recent acute left hip fracture versus acute re-injury. Soft tissue swelling over the left hip. IMPRESSION: 1. Minimally displaced acute fractures of the left superior and inferior pubic rami. 2. Acute minimally displaced fracture of the left sacrum adjacent the sacroiliac joint. 3. Fragmentation of the left greater trochanter which may be part of patient's  known recent acute left femoral neck fracture versus acute re-injury. Recent left hip arthroplasty otherwise intact. Electronically Signed   By: Marin Olp M.D.   On: 12/20/2019 14:13   DG HIP UNILAT WITH PELVIS 2-3 VIEWS LEFT  Result Date: 12/21/2019 CLINICAL DATA:  Left hip pain. EXAM: DG HIP (WITH OR WITHOUT PELVIS) 2-3V LEFT COMPARISON:  CT from yesterday FINDINGS: Total left hip arthroplasty with cerclage wire. Unchanged alignment of a greater trochanter base fracture. Unchanged alignment of a left superior and inferior pubic ramus fracture. Clips over the right hemipelvis. Osteopenia. IMPRESSION: Unchanged alignment of left obturator ring and greater trochanter fractures. Electronically Signed   By: Monte Fantasia M.D.   On: 12/21/2019 10:07   No results for input(s): WBC, HGB, HCT, PLT in the last 72 hours. Recent Labs    12/20/19 0505  NA 133*  K 5.0  CL 97*  CO2 26  GLUCOSE 112*  BUN 41*  CREATININE 1.27*  CALCIUM 9.4    Intake/Output Summary (Last 24 hours) at 12/22/2019 1039 Last data filed at 12/22/2019 0800 Gross per 24 hour  Intake 240 ml  Output 250 ml  Net -10 ml     Physical Exam: Vital Signs Blood pressure 131/69, pulse 96, temperature 98.2 F (36.8 C), temperature source Oral, resp. rate 18, height 5\' 1"  (1.549 m), weight 85 kg, SpO2 95 %. Constitutional: No obvious distress . Vital signs reviewed. HEENT: EOMI, oral membranes moist Neck: supple Cardiovascular: RRR without murmur. No JVD    Respiratory: CTA Bilaterally without wheezes or rales. Normal effort    GI: BS +, non-tender, non-distended  Musculoskeletal:     Comments: Generalized edema LUE (improving) and LLE,  Left leg remains tender with minimal PROM, Low back with mild TTP Neurological: appears a little groggy but is oriented x 3. Follows  full commands. Motor: RUE: 4-/5 proximal to distal LLE: 1-4/5 limited proximally by pain LUE: proximally limited by splint, hand grip 2/5 limited by  splint/swelling LLE: 2+/5 proximal to distal Sensation diminished to light touch b/le feet  Skin:  Left hip incision with dry dressing, mild serous drainage, staples Left upper extremity with sugar tong splint in place. Scattered ecchymoses as above on all 4 limbs, especially in UE's.    Psychiatric: flat, appears groggy  Assessment/Plan: 1. Functional deficits secondary to polytrauma which require 3+ hours per day of interdisciplinary therapy in a comprehensive inpatient rehab setting.  Physiatrist is providing close team supervision and 24 hour management of active medical problems listed below.  Physiatrist and rehab team continue to assess barriers to discharge/monitor patient progress toward functional and medical goals  Care Tool:  Bathing    Body parts bathed by patient: Left arm, Chest, Abdomen, Front perineal area, Right upper leg, Left upper leg, Face   Body parts bathed by helper: Right arm, Buttocks Body parts n/a: Right lower leg, Left lower leg   Bathing assist Assist Level: Maximal Assistance - Patient 24 - 49%     Upper Body Dressing/Undressing Upper body dressing   What is the patient wearing?: Pull over shirt    Upper body assist Assist Level: Moderate Assistance - Patient 50 - 74%    Lower Body Dressing/Undressing Lower body dressing      What is the patient wearing?: Pants     Lower body assist Assist for lower body dressing: 2 Helpers     Toileting Toileting Toileting Activity did not occur (Clothing management and hygiene only): N/A (no void or bm)  Toileting assist Assist for toileting: Total Assistance - Patient < 25%     Transfers Chair/bed transfer  Transfers assist  Chair/bed transfer activity did not occur: Safety/medical concerns  Chair/bed transfer assist level: Minimal Assistance - Patient > 75%     Locomotion Ambulation   Ambulation assist   Ambulation activity did not occur: Safety/medical concerns          Walk  10 feet activity   Assist  Walk 10 feet activity did not occur: Safety/medical concerns        Walk 50 feet activity   Assist Walk 50 feet with 2 turns activity did not occur: Safety/medical concerns         Walk 150 feet activity   Assist Walk 150 feet activity did not occur: Safety/medical concerns         Walk 10 feet on uneven surface  activity   Assist Walk 10 feet on uneven surfaces activity did not occur: Safety/medical concerns         Wheelchair     Assist     Wheelchair activity did not occur: Safety/medical concerns         Wheelchair 50 feet with 2 turns activity    Assist    Wheelchair 50 feet with 2 turns activity did not occur: Safety/medical concerns       Wheelchair 150 feet activity     Assist  Wheelchair 150 feet activity did not occur: Safety/medical concerns       Blood pressure 131/69, pulse 96, temperature 98.2 F (36.8 C), temperature source Oral, resp. rate 18, height 5\' 1"  (1.549 m), weight 85 kg, SpO2 95 %.  Medical Problem List and Plan: 1.  Decreased functional mobility secondary to left displaced femoral neck fracture status post left total hip replacement anterior approach as well as  severely left comminuted distal radius fracture status post ORIF 12/08/2019 as well as forearm laceration repair.  Weightbearing as tolerated through left elbow only and weightbearing as tolerated left lower extremity.  No left hip abduction. Most recent CT from 1/27 revealed fx's of left SPR/IPR and left sacrum.              -patient may  shower if left arm and hip wound covered            -limited by pain. There are no WB restrictions but for all intents and purposes she's unable to bear weight on the LLE. Ortho aware and doesn't recommend changes            -Continue CIR therapies including PT, OT  2.  Antithrombotics: -DVT/anticoagulation: SCDs.  Dopplers negative  1/29 given her lack of mobility this week, will add  lovenox for dvt prophylaxis 40mg  daily             -antiplatelet therapy: Aspirin 325 mg daily 3. Pain Management: Hydrocodone and Robaxin as needed              1/23-added ms contin 15mg  q12 for now d/t significant pain at rest/with activity  1/26: continued significant low back/left buttock pain. I reviewed her images and saw a left SPR fracture.    -increase robaxin to 750mg  and schedule QID  1/27: norco increased   1/28: ortho recs weight bearing as tolerated for now.   -increase ms contin to 30mg  q12  1/29: appears lethargic today. Will change robaxin to 500mg  q6 PRN   -continue same MS contin for now   -encouraged use of heat, ice to area (she already has kpad) 4. Mood: Prozac 40 mg daily             -antipsychotic agents: N/A 5. Neuropsych: This patient is capable of making decisions on her own behalf. 6. Skin/Wound Care: change hip dressing 7. Fluids/Electrolytes/Nutrition: intake poor  1/29 -reiterated the importance of adequate PO intake rightnow 8.  Polycystic kidney disease status post renal transplant 06/02/2012.  Baseline creatinine 1.02-1.29.  Continue Prograf, chronic prednisone as well as Myfortic.  Followed by Dr. Jimmy Footman            1/25: CMP stable except for low Na 131 and elevated Cr 1.21. Will repeat tomorrow to trend.    1/26 Sodium unchanged, BUN/Cr continue to drift up   -encourage fluids, hold lasix   -Na increased to 133  1/29 check labs Monday  9.  Acute blood loss anemia.  Transfused 2 units packed red blood cells 12/11/2019.               Hgb holding at 9.8 on 1.22, 9.2 on 1/25  1/29 recheck monday 10.  Asthma.  Continue inhalers as directed. 11.  History of gout.  Allopurinol daily.  Monitor for any gout flareups 12.  Chronic diastolic congestive heart failure.  Lasix 40 mg daily. Monitor for sign/symptoms of fluid overload.    1/26-?trend, unsure about weights---need more together to assess for trend, she was -20cc for today by I/O's yesterday  -holding  lasix at least for tomorrow d/t #8  1/29 weight stable Filed Weights   12/19/19 0500 12/20/19 0500 12/21/19 0500  Weight: 86.3 kg 85.3 kg 85 kg    13. Constipation: sorbitol, SSE   if needed  -scheduled senna-s  -large BM 1/27  LOS: 7 days A FACE TO FACE EVALUATION WAS PERFORMED  Meredith Staggers 12/22/2019,  10:39 AM

## 2019-12-22 NOTE — Plan of Care (Signed)
  Problem: Consults Goal: RH GENERAL PATIENT EDUCATION Description: See Patient Education module for education specifics. Outcome: Progressing   Problem: RH BOWEL ELIMINATION Goal: RH STG MANAGE BOWEL WITH ASSISTANCE Description: STG Manage Bowel with Mod Assistance. Outcome: Progressing   Problem: RH BLADDER ELIMINATION Goal: RH STG MANAGE BLADDER WITH ASSISTANCE Description: STG Manage Bladder With Mod Assistance Outcome: Progressing   Problem: RH SKIN INTEGRITY Goal: RH STG SKIN FREE OF INFECTION/BREAKDOWN Description: No new breakdown with min assist  Outcome: Progressing Goal: RH STG ABLE TO PERFORM INCISION/WOUND CARE W/ASSISTANCE Description: STG Able To Perform Incision/Wound Care With Mod Assistance. Outcome: Progressing   Problem: RH SAFETY Goal: RH STG ADHERE TO SAFETY PRECAUTIONS W/ASSISTANCE/DEVICE Description: STG Adhere to Safety Precautions With Mod Assistance/Device. Outcome: Progressing   Problem: RH PAIN MANAGEMENT Goal: RH STG PAIN MANAGED AT OR BELOW PT'S PAIN GOAL Description: < 4 out of 10.  Outcome: Progressing

## 2019-12-22 NOTE — Progress Notes (Signed)
Occupational Therapy Note  Patient Details  Name: April [name] MRN: [id number] Date of Birth: 1956/09/30  Today's Date: 12/22/2019 OT Missed Time: 54 Minutes Missed Time Reason: Pain;Other (comment)  Pt asleep in bed upon arrival but easily aroused. Pt commented that although she wasn't in pain she was fearful that if she sat EOB or repositioned in bed she would be in pain.  Pt declined participating at this time. Pt missed 45 mins skilled OT services.    Leotis Shames [facility] 12/22/2019, 12:01 PM

## 2019-12-23 ENCOUNTER — Inpatient Hospital Stay (HOSPITAL_COMMUNITY): Payer: Medicare Other

## 2019-12-23 ENCOUNTER — Inpatient Hospital Stay (HOSPITAL_COMMUNITY): Payer: Medicare Other | Admitting: Occupational Therapy

## 2019-12-23 NOTE — Progress Notes (Signed)
Physical Therapy Session Note  Patient Details  Name: April [name] MRN: [id number] Date of Birth: 05-Jan-1956  Today's Date: 12/23/2019 PT Individual Time: 1000-1058 PT Individual Time Calculation (min): 58 min   Short Term Goals: Week 1:  PT Short Term Goal 1 (Week 1): Pt will initiate transfer training PT Short Term Goal 1 - Progress (Week 1): Met PT Short Term Goal 2 (Week 1): Pt will perform bed mobility w/ max assist PT Short Term Goal 2 - Progress (Week 1): Met PT Short Term Goal 3 (Week 1): Pt will tolerate 60 min of upright activity w/ minimal increase in fatigue PT Short Term Goal 3 - Progress (Week 1): Met Week 2:  PT Short Term Goal 1 (Week 2): =LTGs due to ELOS  Skilled Therapeutic Interventions/Progress Updates:   Received pt supine in bed, pt agreeable to therapy and stated pain 3/10 in bilateral hips. RN aware and patient recently received pain medication. Session focused on functional mobility, ROM exercises in a pain-free range, strategies for reducing pain and muscle spasms, and improved tolerance to activity. Pt attempted to roll to R x 2 trials with HOB elevated and use of bedrails, however pt with increased pain and severe facial grimacing and had to stop activity. Pt appeared frustrated with high pain levels limiting her ability to participate in therapy. Therapist provided emotional support and encouragement. Pt performed diaphramatic breathing 2x10 reps with cues for slow controlled breaths and to rest hand on stomach for feedback. Pt reported decreased pain and muscle spasms after breathing. Therapist provided hot packs to patient for pain relief and positioned them on back and anterior stomach. Pt performed L LE heel slides x 3, but stopped due to increased pain. Pt with decreased L knee flexion ROM. Therapist applied deep pressure and performed manual therapy massage to bilateral quadriceps to calm muscle spasms; pt reported relief in quads. Pt reported increased pain  traveling down the posterior aspect of L LE. Therapist performed L hamstring massage. Pt reported pain relief and decreased muscle spasms after manual therapy. Pt performed the following exercises in supine: -Ankle pumps x15 bilaterally -R LE heel slides x5 and R hip abduction x5 (unable to perform on L due to precautions) -Bilateral quad sets x5 -L LE heel slides x trial 2 (pt able to complete x5 this time with decreased pain) Attempted RLE SLR however pt with increased pain and facial grimacing and stopped activity. Discussed with pt that some discomfort with exercises is normal, but to stop activities that produce severe pain. Concluded session with pt supine in bed, needs within reach, bed alarm on, and MD present.    Therapy Documentation Precautions:  Precautions Precautions: Fall Required Braces or Orthoses: Sling Restrictions Weight Bearing Restrictions: Yes LUE Weight Bearing: Weight bear through elbow only LLE Weight Bearing: Weight bearing as tolerated Other Position/Activity Restrictions: no L hip active abduction   Therapy/Group: Individual Therapy Alfonse Alpers PT, DPT   12/23/2019, 7:33 AM

## 2019-12-23 NOTE — Progress Notes (Signed)
Occupational Therapy Session Note  Patient Details  Name: April [name] MRN: [id number] Date of Birth: 1956/10/10  Today's Date: 12/23/2019 OT Individual Time: 1332-1446 OT Individual Time Calculation (min): 74 min   Skilled Therapeutic Interventions/Progress Updates:    Pt greeted in bed, requesting to use the urinal. Encouraged BSC transfer for OOB toileting however pt stated movement causes too much pain and refused to attempt. Pt dependent for toileting using urinal and also for pericare. Noted bowel incontinence in brief with pt needing assist for additional hygiene and brief change. She rolled Rt>Lt with Min-Mod A, placed pillow between legs with instruction and assist to keep knees together to prevent L LE abduction. Pt reported resting pain was normally 3/10, pain after bed mobility today 8/10. Afterwards we openly discussed coping strategy development for holistic pain mgt. Discussed the concept of pain maps in the brain and how using mindfulness intervention strategies can help to dampen pain perception by increasing PNS activity. Pt willing to try a guided visualization exercise during tx. OT first created a calming environment with aromatherapy per pts preference. 02 sats on 1L 90%, resting pain 3/10. OT then guided pt through one mindfulness meditation, focusing on deep diaphragmatic breathing with Rt hand on belly and progressive muscle relaxation. Pt afterwards stated her resting pain was 2/10, 02 sats 93% on 1L. She was interested in having resources to use meditations independently in the room. We used her electronic device to create a personal playlist to use throughout her day. Pt able to access playlist by herself via teach back method after several attempts with therapist's guidance. Wrote down instructions for her to follow in case she forgets steps, placed instructions within reach in her health resource notebook. Discussed how meditation exercises can also help with stress,  anxiety, and insomnia. Pt grateful for education. She remained comfortably in bed with all needs within reach and bed alarm set. Tx focus placed on holistic pain mgt in order to enable participation in functional tasks.    Therapy Documentation Precautions:  Precautions Precautions: Fall Required Braces or Orthoses: Sling Restrictions Weight Bearing Restrictions: Yes LUE Weight Bearing: Weight bear through elbow only LLE Weight Bearing: Weight bearing as tolerated Other Position/Activity Restrictions: no L hip active abduction Vital Signs: Therapy Vitals Temp: 98.7 F (37.1 C) Pulse Rate: 96 Resp: 16 BP: (!) 125/58 Patient Position (if appropriate): Lying Oxygen Therapy SpO2: (!) 88 % O2 Device: Room Air   ADL: ADL Grooming: Minimal assistance Where Assessed-Grooming: Edge of bed Upper Body Bathing: Minimal assistance Where Assessed-Upper Body Bathing: Edge of bed Lower Body Bathing: Maximal assistance Where Assessed-Lower Body Bathing: Edge of bed Upper Body Dressing: Supervision/safety Where Assessed-Upper Body Dressing: Edge of bed Lower Body Dressing: (2 helper sit to stand) Where Assessed-Lower Body Dressing: Edge of bed      Therapy/Group: Individual Therapy  April [name] 12/23/2019, 4:28 PM

## 2019-12-23 NOTE — Progress Notes (Signed)
[name] PHYSICAL MEDICINE & REHABILITATION PROGRESS NOTE   Subjective/Complaints: In intense pain. Finishing up PT session. Upset at her inability to tolerate session. Later in day desaturated to high 80s and started on 1L [name].  Sat at EOB during PT session and felt fatigue and in severe pain. Did bed-level exercises.   ROS: Patient denies fever, rash, sore throat, blurred vision, nausea, vomiting, diarrhea, cough, shortness of breath or chest pain, headache, or mood change.     Objective:   No results found. No results for input(s): WBC, HGB, HCT, PLT in the last 72 hours. No results for input(s): NA, K, CL, CO2, GLUCOSE, BUN, CREATININE, CALCIUM in the last 72 hours.  Intake/Output Summary (Last 24 hours) at 12/23/2019 1539 Last data filed at 12/23/2019 1320 Gross per 24 hour  Intake 240 ml  Output 450 ml  Net -210 ml     Physical Exam: Vital Signs Blood pressure (!) 125/58, pulse 96, temperature 98.7 F (37.1 C), resp. rate 16, height 5\' 1"  (1.549 m), weight 85 kg, SpO2 (!) 88 %. Constitutional: No obvious distress . Vital signs reviewed. In intense pain.  HEENT: EOMI, oral membranes moist Neck: supple Cardiovascular: RRR without murmur. No JVD    Respiratory: CTA Bilaterally without wheezes or rales. Desaturated to high 80s. Now on 1L [name].  GI: BS +, non-tender, non-distended  Musculoskeletal:     Comments: Generalized edema LUE (improving) and LLE,  Left leg remains tender with minimal PROM, Low back with mild TTP Neurological: appears a little groggy but is oriented x 3. Follows full commands. Motor: RUE: 4-/5 proximal to distal LLE: 1-4/5 limited proximally by pain LUE: proximally limited by splint, hand grip 2/5 limited by splint/swelling LLE: 2+/5 proximal to distal Sensation diminished to light touch b/le feet  Skin:  Left hip incision with dry dressing, mild serous drainage, staples Left upper extremity with sugar tong splint in place. Scattered ecchymoses as  above on all 4 limbs, especially in UE's.    Psychiatric: flat, appears groggy  Assessment/Plan: 1. Functional deficits secondary to polytrauma which require 3+ hours per day of interdisciplinary therapy in a comprehensive inpatient rehab setting.  Physiatrist is providing close team supervision and 24 hour management of active medical problems listed below.  Physiatrist and rehab team continue to assess barriers to discharge/monitor patient progress toward functional and medical goals  Care Tool:  Bathing    Body parts bathed by patient: Left arm, Chest, Abdomen, Front perineal area, Right upper leg, Left upper leg, Face   Body parts bathed by helper: Right arm, Buttocks Body parts n/a: Right lower leg, Left lower leg   Bathing assist Assist Level: Maximal Assistance - Patient 24 - 49%     Upper Body Dressing/Undressing Upper body dressing   What is the patient wearing?: Pull over shirt    Upper body assist Assist Level: Moderate Assistance - Patient 50 - 74%    Lower Body Dressing/Undressing Lower body dressing      What is the patient wearing?: Pants     Lower body assist Assist for lower body dressing: 2 Helpers     Toileting Toileting Toileting Activity did not occur (Clothing management and hygiene only): N/A (no void or bm)  Toileting assist Assist for toileting: Total Assistance - Patient < 25%     Transfers Chair/bed transfer  Transfers assist  Chair/bed transfer activity did not occur: Safety/medical concerns  Chair/bed transfer assist level: Minimal Assistance - Patient > 75%  Locomotion Ambulation   Ambulation assist   Ambulation activity did not occur: Safety/medical concerns          Walk 10 feet activity   Assist  Walk 10 feet activity did not occur: Safety/medical concerns        Walk 50 feet activity   Assist Walk 50 feet with 2 turns activity did not occur: Safety/medical concerns         Walk 150 feet  activity   Assist Walk 150 feet activity did not occur: Safety/medical concerns         Walk 10 feet on uneven surface  activity   Assist Walk 10 feet on uneven surfaces activity did not occur: Safety/medical concerns         Wheelchair     Assist     Wheelchair activity did not occur: Safety/medical concerns         Wheelchair 50 feet with 2 turns activity    Assist    Wheelchair 50 feet with 2 turns activity did not occur: Safety/medical concerns       Wheelchair 150 feet activity     Assist  Wheelchair 150 feet activity did not occur: Safety/medical concerns       Blood pressure (!) 125/58, pulse 96, temperature 98.7 F (37.1 C), resp. rate 16, height 5\' 1"  (1.549 m), weight 85 kg, SpO2 (!) 88 %.  Medical Problem List and Plan: 1.  Decreased functional mobility secondary to left displaced femoral neck fracture status post left total hip replacement anterior approach as well as severely left comminuted distal radius fracture status post ORIF 12/08/2019 as well as forearm laceration repair.  Weightbearing as tolerated through left elbow only and weightbearing as tolerated left lower extremity.  No left hip abduction. Most recent CT from 1/27 revealed fx's of left SPR/IPR and left sacrum.              -patient may  shower if left arm and hip wound covered            -limited by pain. There are no WB restrictions but for all intents and purposes she's unable to bear weight on the LLE. Ortho aware and doesn't recommend changes            -Continue CIR therapies including PT, OT  2.  Antithrombotics: -DVT/anticoagulation: SCDs.  Dopplers negative  1/29 given her lack of mobility this week, will add lovenox for dvt prophylaxis 40mg  daily             -antiplatelet therapy: Aspirin 325 mg daily 3. Pain Management: Hydrocodone and Robaxin as needed              1/23-added ms contin 15mg  q12 for now d/t significant pain at rest/with activity  1/26: continued  significant low back/left buttock pain. I reviewed her images and saw a left SPR fracture.    -increase robaxin to 750mg  and schedule QID  1/27: norco increased   1/28: ortho recs weight bearing as tolerated for now.   -increase ms contin to 30mg  q12  1/29: appears lethargic today. Will change robaxin to 500mg  q6 PRN   -continue same MS contin for now   -encouraged use of heat, ice to area (she already has kpad)  1/30: In intense pain that limited OOB therapies. Engaged in bed-level exercise with PT. She is frustrated by her inability to tolerate therapy. Advised her that she has multiple pelvic/sacral fractures that can cause intense pain and may take  time to heal. Mobility is important but she should not blame herself for her limitations given her fractures.  4. Mood: Prozac 40 mg daily             -antipsychotic agents: N/A 5. Neuropsych: This patient is capable of making decisions on her own behalf. 6. Skin/Wound Care: change hip dressing 7. Fluids/Electrolytes/Nutrition: intake poor  1/29 -reiterated the importance of adequate PO intake rightnow 8.  Polycystic kidney disease status post renal transplant 06/02/2012.  Baseline creatinine 1.02-1.29.  Continue Prograf, chronic prednisone as well as Myfortic.  Followed by Dr. Jimmy Footman            1/25: CMP stable except for low Na 131 and elevated Cr 1.21. Will repeat tomorrow to trend.    1/26 Sodium unchanged, BUN/Cr continue to drift up   -encourage fluids, hold lasix   -Na increased to 133  1/29 check labs Monday  9.  Acute blood loss anemia.  Transfused 2 units packed red blood cells 12/11/2019.               Hgb holding at 9.8 on 1.22, 9.2 on 1/25  1/29 recheck monday 10.  Asthma.  Continue inhalers as directed. 11.  History of gout.  Allopurinol daily.  Monitor for any gout flareups 12.  Chronic diastolic congestive heart failure.  Lasix 40 mg daily. Monitor for sign/symptoms of fluid overload.    1/26-?trend, unsure about  weights---need more together to assess for trend, she was -20cc for today by I/O's yesterday  -holding lasix at least for tomorrow d/t #8  1/29, 1/30 weight stable Filed Weights   12/19/19 0500 12/20/19 0500 12/21/19 0500  Weight: 86.3 kg 85.3 kg 85 kg    13. Constipation: sorbitol, SSE   if needed  -scheduled senna-s  -large BM 1/27 14. Disposition: Discussed expected challenges at home given her regression in mobility due to her painful fractures. Her mother is in her 64s and her husband is recovering from hip surgery so neither will be able to help her much. She has a son home from college who will be doing his schoolwork at home, and I advised that he should be able to help her as needed during the day. She would benefit from discussing with social work further homecare options available to her upon discharge.   LOS: 8 days A FACE TO FACE EVALUATION WAS PERFORMED  April [name] 12/23/2019, 3:39 PM

## 2019-12-23 NOTE — Plan of Care (Signed)
  Problem: Consults Goal: RH GENERAL PATIENT EDUCATION Description: See Patient Education module for education specifics. Outcome: Progressing   Problem: RH BOWEL ELIMINATION Goal: RH STG MANAGE BOWEL WITH ASSISTANCE Description: STG Manage Bowel with Mod Assistance. Outcome: Progressing   Problem: RH BLADDER ELIMINATION Goal: RH STG MANAGE BLADDER WITH ASSISTANCE Description: STG Manage Bladder With Mod Assistance Outcome: Progressing   Problem: RH SKIN INTEGRITY Goal: RH STG SKIN FREE OF INFECTION/BREAKDOWN Description: No new breakdown with min assist  Outcome: Progressing Goal: RH STG ABLE TO PERFORM INCISION/WOUND CARE W/ASSISTANCE Description: STG Able To Perform Incision/Wound Care With Mod Assistance. Outcome: Progressing   Problem: RH SAFETY Goal: RH STG ADHERE TO SAFETY PRECAUTIONS W/ASSISTANCE/DEVICE Description: STG Adhere to Safety Precautions With Mod Assistance/Device. Outcome: Progressing   Problem: RH PAIN MANAGEMENT Goal: RH STG PAIN MANAGED AT OR BELOW PT'S PAIN GOAL Description: < 4 out of 10.  Outcome: Progressing

## 2019-12-24 ENCOUNTER — Inpatient Hospital Stay (HOSPITAL_COMMUNITY): Payer: Medicare Other

## 2019-12-24 MED ORDER — FUROSEMIDE 20 MG PO TABS
20.0000 mg | ORAL_TABLET | Freq: Once | ORAL | Status: AC
  Administered 2019-12-24: 20 mg via ORAL
  Filled 2019-12-24: qty 1

## 2019-12-24 NOTE — Progress Notes (Signed)
[place] PHYSICAL MEDICINE & REHABILITATION PROGRESS NOTE   Subjective/Complaints: Just finished working with therapy.  Now in pain but resting. Denies SOB Slept well last night.   ROS: Patient denies fever, rash, sore throat, blurred vision, nausea, vomiting, diarrhea, cough, shortness of breath or chest pain, headache, or mood change.     Objective:   No results found. No results for input(s): WBC, HGB, HCT, PLT in the last 72 hours. No results for input(s): NA, K, CL, CO2, GLUCOSE, BUN, CREATININE, CALCIUM in the last 72 hours.  Intake/Output Summary (Last 24 hours) at 12/24/2019 1149 Last data filed at 12/24/2019 0900 Gross per 24 hour  Intake 480 ml  Output 500 ml  Net -20 ml     Physical Exam: Vital Signs Blood pressure (!) 134/57, pulse 92, temperature 97.9 F (36.6 C), resp. rate 16, height 5\' 1"  (1.549 m), weight 86.9 kg, SpO2 92 %. Constitutional: No obvious distress . Vital signs reviewed. In pain.  HEENT: EOMI, oral membranes moist Neck: supple Cardiovascular: RRR without murmur. No JVD    Respiratory: CTA Bilaterally without wheezes or rales. Mildly increased work of breathing today.  GI: BS +, non-tender, non-distended  Musculoskeletal:     Comments: Generalized edema LUE (improving) and LLE,  Left leg remains tender with minimal PROM, Low back with mild TTP Neurological: appears a little groggy but is oriented x 3. Follows full commands. Motor: RUE: 4-/5 proximal to distal LLE: 1-4/5 limited proximally by pain LUE: proximally limited by splint, hand grip 2/5 limited by splint/swelling LLE: 2+/5 proximal to distal Sensation diminished to light touch b/le feet  Skin:  Left hip incision with dry dressing, mild serous drainage, staples Left upper extremity with sugar tong splint in place. Scattered ecchymoses as above on all 4 limbs, especially in UE's.    Psychiatric: flat, appears groggy  Assessment/Plan: 1. Functional deficits secondary to polytrauma  which require 3+ hours per day of interdisciplinary therapy in a comprehensive inpatient rehab setting.  Physiatrist is providing close team supervision and 24 hour management of active medical problems listed below.  Physiatrist and rehab team continue to assess barriers to discharge/monitor patient progress toward functional and medical goals  Care Tool:  Bathing    Body parts bathed by patient: Left arm, Chest, Abdomen, Front perineal area, Right upper leg, Left upper leg, Face   Body parts bathed by helper: Right arm, Buttocks Body parts n/a: Right lower leg, Left lower leg   Bathing assist Assist Level: Maximal Assistance - Patient 24 - 49%     Upper Body Dressing/Undressing Upper body dressing   What is the patient wearing?: Pull over shirt    Upper body assist Assist Level: Moderate Assistance - Patient 50 - 74%    Lower Body Dressing/Undressing Lower body dressing      What is the patient wearing?: Pants     Lower body assist Assist for lower body dressing: 2 Helpers     Toileting Toileting Toileting Activity did not occur (Clothing management and hygiene only): N/A (no void or bm)  Toileting assist Assist for toileting: Total Assistance - Patient < 25%     Transfers Chair/bed transfer  Transfers assist  Chair/bed transfer activity did not occur: Safety/medical concerns  Chair/bed transfer assist level: Minimal Assistance - Patient > 75%     Locomotion Ambulation   Ambulation assist   Ambulation activity did not occur: Safety/medical concerns          Walk 10 feet activity  Assist  Walk 10 feet activity did not occur: Safety/medical concerns        Walk 50 feet activity   Assist Walk 50 feet with 2 turns activity did not occur: Safety/medical concerns         Walk 150 feet activity   Assist Walk 150 feet activity did not occur: Safety/medical concerns         Walk 10 feet on uneven surface  activity   Assist Walk 10  feet on uneven surfaces activity did not occur: Safety/medical concerns         Wheelchair     Assist     Wheelchair activity did not occur: Safety/medical concerns         Wheelchair 50 feet with 2 turns activity    Assist    Wheelchair 50 feet with 2 turns activity did not occur: Safety/medical concerns       Wheelchair 150 feet activity     Assist  Wheelchair 150 feet activity did not occur: Safety/medical concerns       Blood pressure (!) 134/57, pulse 92, temperature 97.9 F (36.6 C), resp. rate 16, height 5\' 1"  (1.549 m), weight 86.9 kg, SpO2 92 %.  Medical Problem List and Plan: 1.  Decreased functional mobility secondary to left displaced femoral neck fracture status post left total hip replacement anterior approach as well as severely left comminuted distal radius fracture status post ORIF 12/08/2019 as well as forearm laceration repair.  Weightbearing as tolerated through left elbow only and weightbearing as tolerated left lower extremity.  No left hip abduction. Most recent CT from 1/27 revealed fx's of left SPR/IPR and left sacrum.              -patient may  shower if left arm and hip wound covered            -limited by pain. There are no WB restrictions but for all intents and purposes she's unable to bear weight on the LLE. Ortho aware and doesn't recommend changes            -Continue CIR therapies including PT, OT  2.  Antithrombotics: -DVT/anticoagulation: SCDs.  Dopplers negative  1/29 given her lack of mobility this week, will add lovenox for dvt prophylaxis 40mg  daily             -antiplatelet therapy: Aspirin 325 mg daily 3. Pain Management: Hydrocodone and Robaxin as needed              1/23-added ms contin 15mg  q12 for now d/t significant pain at rest/with activity  1/26: continued significant low back/left buttock pain. I reviewed her images and saw a left SPR fracture.    -increase robaxin to 750mg  and schedule QID  1/27: norco  increased   1/28: ortho recs weight bearing as tolerated for now.   -increase ms contin to 30mg  q12  1/29: appears lethargic today. Will change robaxin to 500mg  q6 PRN   -continue same MS contin for now   -encouraged use of heat, ice to area (she already has kpad)  1/30: In intense pain that limited OOB therapies. Engaged in bed-level exercise with PT. She is frustrated by her inability to tolerate therapy. Advised her that she has multiple pelvic/sacral fractures that can cause intense pain and may take time to heal. Mobility is important but she should not blame herself for her limitations given her fractures.   1/31: Currently in pain after morning therapy session. With some sedation  from pain medications, will continue current regimen.  4. Mood: Prozac 40 mg daily             -antipsychotic agents: N/A 5. Neuropsych: This patient is capable of making decisions on her own behalf. 6. Skin/Wound Care: change hip dressing 7. Fluids/Electrolytes/Nutrition: intake poor  1/29 -reiterated the importance of adequate PO intake rightnow 8.  Polycystic kidney disease status post renal transplant 06/02/2012.  Baseline creatinine 1.02-1.29.  Continue Prograf, chronic prednisone as well as Myfortic.  Followed by Dr. Jimmy Footman            1/25: CMP stable except for low Na 131 and elevated Cr 1.21. Will repeat tomorrow to trend.    1/26 Sodium unchanged, BUN/Cr continue to drift up   -encourage fluids, hold lasix   -Na increased to 133  1/29 check labs Monday  9.  Acute blood loss anemia.  Transfused 2 units packed red blood cells 12/11/2019.               Hgb holding at 9.8 on 1.22, 9.2 on 1/25  1/29 recheck monday 10.  Asthma.  Continue inhalers as directed. 11.  History of gout.  Allopurinol daily.  Monitor for any gout flareups 12.  Chronic diastolic congestive heart failure.  Lasix 40 mg daily. Monitor for sign/symptoms of fluid overload.    1/26-?trend, unsure about weights---need more together to  assess for trend, she was -20cc for today by I/O's yesterday  -holding lasix at least for tomorrow d/t #8  1/29, 1/30 weight stable  1/31: increased weight, does appear to have increased work of breathing. Received Lasix 40 this morning. Will add additional 20mg  now.  Filed Weights   12/20/19 0500 12/21/19 0500 12/24/19 0526  Weight: 85.3 kg 85 kg 86.9 kg    13. Constipation: sorbitol, SSE   if needed  -scheduled senna-s  -large BM 1/27 14. Disposition: Discussed expected challenges at home given her regression in mobility due to her painful fractures. Her mother is in her 18s and her husband is recovering from hip surgery so neither will be able to help her much. She has a son home from college who will be doing his schoolwork at home, and I advised that he should be able to help her as needed during the day. She would benefit from discussing with social work further homecare options available to her upon discharge.   LOS: 9 days A FACE TO FACE EVALUATION WAS PERFORMED  April [name] 12/24/2019, 11:49 AM

## 2019-12-24 NOTE — Progress Notes (Signed)
Occupational Therapy Weekly Progress Note  Patient Details  Name: April [name] MRN: [id number] Date of Birth: 1956-02-07  Beginning of progress report period: December 16, 2019 End of progress report period: December 24, 2019  Today's Date: 12/24/2019 OT Individual Time: 7482-7078 OT Individual Time Calculation (min): 58 min    Patient has met 2 of 4 short term goals.  Pt is making slow progress this reporting period. Pt had improved mobility to min-mod A with PFRW, however is having increased pain with mobilization and is no longer able to tolerate pain sitting EOB or WB. ADL participation has been limited d/t pain and alternative pain management strategies are being explored to improve functional performance  Patient continues to demonstrate the following deficits: muscle weakness, decreased cardiorespiratoy endurance and decreased sitting balance, decreased standing balance, decreased postural control and decreased balance strategies and therefore will continue to benefit from skilled OT intervention to enhance overall performance with BADL.  Patient progressing toward long term goals..  Continue plan of care.  OT Short Term Goals Week 1:  OT Short Term Goal 1 (Week 1): Pt will sit to stand wiht MOD A of 1 in prep for CM OT Short Term Goal 1 - Progress (Week 1): Met OT Short Term Goal 2 (Week 1): Pt will transfer to w/c or BSC with MAX A of 1 to decrease BOC OT Short Term Goal 2 - Progress (Week 1): Not met OT Short Term Goal 3 (Week 1): Pt will thread 1LE into pants with AE PRN OT Short Term Goal 3 - Progress (Week 1): Not met OT Short Term Goal 4 (Week 1): Pt will groom with set up OT Short Term Goal 4 - Progress (Week 1): Met Week 2:  OT Short Term Goal 1 (Week 2): Pt will transfer to [facility] wiht MAX A of 1 and LRAD OT Short Term Goal 2 (Week 2): Pt will tolerate sitting EOB/getting OOB at least 1x per day OT Short Term Goal 3 (Week 2): Pt will thread 1LE into pants wiht AE PRN OT  Short Term Goal 4 (Week 2): Pt will demo alternative pain management strategies with min VC to improve participation in mobility/ADLs  Skilled Therapeutic Interventions/Progress Updates:    1:1. Pt received in bed reporting uncomfortable eating position as pt unable to tolerate HOB elevated. Attempted positioning patient in chair position to improve tolerance to sitting Upright in prep for EOB movement. Pt reporting 3/10 in chair position 30* elevated HOB. Provided pt with modular hose phon holder d/t bulky cast limiting bimanual use of LUE to hold phone. Reviewed use of progressive muscle relaxation to use as continued elevation into chair position. Pt able to tolerate maximum of 41 degrees HOB elevation. Pt reporting 2/10 pain after strategy, however still uncomfortable sitting up. Pt agreed to sitting in chair position 10 min to build tolerance to upright. Pt also trialed wedge under L hip for comfort positioning in bed, with no improvement in comfort. Exited session with pt seated in bed exit alarm on and call light in reach  Therapy Documentation Precautions:  Precautions Precautions: Fall Required Braces or Orthoses: Sling Restrictions Weight Bearing Restrictions: Yes LUE Weight Bearing: Weight bear through elbow only LLE Weight Bearing: Weight bearing as tolerated Other Position/Activity Restrictions: no L hip active abduction General:   Vital Signs: Therapy Vitals Temp: 97.9 F (36.6 C) Pulse Rate: 95 Resp: 18 BP: (!) 134/57 Patient Position (if appropriate): Lying Oxygen Therapy SpO2: 91 % O2 Device: Nasal Cannula O2  Flow Rate (L/min): 1 L/min Pain:   ADL: ADL Grooming: Minimal assistance Where Assessed-Grooming: Edge of bed Upper Body Bathing: Minimal assistance Where Assessed-Upper Body Bathing: Edge of bed Lower Body Bathing: Maximal assistance Where Assessed-Lower Body Bathing: Edge of bed Upper Body Dressing: Supervision/safety Where Assessed-Upper Body Dressing:  Edge of bed Lower Body Dressing: (2 helper sit to stand) Where Assessed-Lower Body Dressing: Edge of bed Vision   Perception    Praxis   Exercises:   Other Treatments:     Therapy/Group: Individual Therapy  Tonny Branch 12/24/2019, 6:37 AM

## 2019-12-24 NOTE — Progress Notes (Signed)
Physical Therapy Session Note  Patient Details  Name: SALEEMA WEPPLER MRN: [id number] Date of Birth: [birth year]  Today's Date: 12/24/2019 PT Individual Time: 1100-1200 PT Individual Time Calculation (min): 60 min   Short Term Goals: Week 1:  PT Short Term Goal 1 (Week 1): Pt will initiate transfer training PT Short Term Goal 1 - Progress (Week 1): Met PT Short Term Goal 2 (Week 1): Pt will perform bed mobility w/ max assist PT Short Term Goal 2 - Progress (Week 1): Met PT Short Term Goal 3 (Week 1): Pt will tolerate 60 min of upright activity w/ minimal increase in fatigue PT Short Term Goal 3 - Progress (Week 1): Met Week 2:  PT Short Term Goal 1 (Week 2): =LTGs due to ELOS Week 3:     Skilled Therapeutic Interventions/Progress Updates:     PAIN  10/10 L hip, treatment to tolerance, repositioning/rest as needed  Pt initially supine and agreeable to treatment session with focus on mobilization.  Pt appears anxious and noted to be sighing at times/rapid breathing at others.  Also fluctuating level of consciousness ?pain med related.    Attempted supine  To sit on edge of bed via elevating hob to 60 degrees and despite mult efforts/additional time/ and cues, pt unable to tolerate due to c/o pubic symphysis region pain.  Returned to supine and recovered/repositioned.   Performed bilat LE therex including 3x10-15 of the following: Hip ir/er Heel slides TKEs Ankle pumps Glut sets Pt requires frequent rest breaks and repositioning due to pain in area of pubic symphysis, tearful at times, additional time.  Encouraged relaxation/deep breathing, pt w/limited receptiveness to this.    Pt agreeable to attempting supine to side to sit to R side.  Performed successfully w/max assist and gradual progression.  Much less painful w/this approach.   Pt then able to tolerate 12 min sitting on edge of bed w/cga and RUE support, performs LAQ's , ankle pumps, distraction provided in attempt to prolong  tolerance.  When therapist breached idea of transfer to wc pt became very anxious and stated he was in so much pain she immediately needed to return supine.   Pt returned sit to supine w/max assist. Moving LLE more spontaneously then noted during rest of session.   Able to scoot in bed w/mod assist, bed in partial trendelenberg, use of RUE and RLE. HOB very gradually increased to angle allowing for pt to eat lunch in bed, but slow process limited by bouts of pain.   Pt left supine w/rails up x 3, alarm set, bed in lowest position, and needs in reach.  Mobility is very limited by c/o pain, ? Anxiety component to pain.  May tolerate reclining wc better due to increased hip pain in full upright after short periods.    Therapy Documentation Precautions:  Precautions Precautions: Fall Required Braces or Orthoses: Sling Restrictions Weight Bearing Restrictions: Yes LUE Weight Bearing: Weight bear through elbow only LLE Weight Bearing: Weight bearing as tolerated Other Position/Activity Restrictions: no L hip active abduction    Therapy/Group: Individual Therapy  Callie Fielding, Kyle 12/24/2019, 12:28 PM

## 2019-12-25 ENCOUNTER — Inpatient Hospital Stay (HOSPITAL_COMMUNITY): Payer: Medicare Other | Admitting: Physical Therapy

## 2019-12-25 ENCOUNTER — Inpatient Hospital Stay (HOSPITAL_COMMUNITY): Payer: Medicare Other | Admitting: Occupational Therapy

## 2019-12-25 LAB — CBC
HCT: 30.2 % — ABNORMAL LOW (ref 36.0–46.0)
Hemoglobin: 8.9 g/dL — ABNORMAL LOW (ref 12.0–15.0)
MCH: 28.7 pg (ref 26.0–34.0)
MCHC: 29.5 g/dL — ABNORMAL LOW (ref 30.0–36.0)
MCV: 97.4 fL (ref 80.0–100.0)
Platelets: 282 10*3/uL (ref 150–400)
RBC: 3.1 MIL/uL — ABNORMAL LOW (ref 3.87–5.11)
RDW: 19.4 % — ABNORMAL HIGH (ref 11.5–15.5)
WBC: 10.3 10*3/uL (ref 4.0–10.5)
nRBC: 0.3 % — ABNORMAL HIGH (ref 0.0–0.2)

## 2019-12-25 LAB — BASIC METABOLIC PANEL
Anion gap: 12 (ref 5–15)
BUN: 50 mg/dL — ABNORMAL HIGH (ref 8–23)
CO2: 19 mmol/L — ABNORMAL LOW (ref 22–32)
Calcium: 8.7 mg/dL — ABNORMAL LOW (ref 8.9–10.3)
Chloride: 96 mmol/L — ABNORMAL LOW (ref 98–111)
Creatinine, Ser: 1.43 mg/dL — ABNORMAL HIGH (ref 0.44–1.00)
GFR calc Af Amer: 45 mL/min — ABNORMAL LOW (ref >60)
GFR calc non Af Amer: 39 mL/min — ABNORMAL LOW (ref >60)
Glucose, Bld: 102 mg/dL — ABNORMAL HIGH (ref 70–99)
Potassium: 4.4 mmol/L (ref 3.5–5.1)
Sodium: 127 mmol/L — ABNORMAL LOW (ref 135–145)

## 2019-12-25 MED ORDER — ALPRAZOLAM 0.25 MG PO TABS
0.2500 mg | ORAL_TABLET | Freq: Two times a day (BID) | ORAL | Status: DC
  Administered 2019-12-25 – 2019-12-29 (×8): 0.25 mg via ORAL
  Filled 2019-12-25 (×8): qty 1

## 2019-12-25 MED ORDER — ALPRAZOLAM 0.25 MG PO TABS
0.2500 mg | ORAL_TABLET | Freq: Two times a day (BID) | ORAL | Status: DC
  Administered 2019-12-25: 0.25 mg via ORAL
  Filled 2019-12-25: qty 1

## 2019-12-25 NOTE — Progress Notes (Signed)
Occupational Therapy Session Note  Patient Details  Name: April [name] MRN: [id number] Date of Birth: 09-Sep-1956  Today's Date: 12/25/2019 OT Individual Time: 1300-1325 OT Individual Time Calculation (min): 25 min    Short Term Goals: Week 2:  OT Short Term Goal 1 (Week 2): Pt will transfer to [facility] wiht MAX A of 1 and LRAD OT Short Term Goal 2 (Week 2): Pt will tolerate sitting EOB/getting OOB at least 1x per day OT Short Term Goal 3 (Week 2): Pt will thread 1LE into pants wiht AE PRN OT Short Term Goal 4 (Week 2): Pt will demo alternative pain management strategies with min VC to improve participation in mobility/ADLs Skilled Therapeutic Interventions/Progress Updates:    Upon entering the room, pt supine in bed with HOB elevated and RN present. Per RN report, pt with increasing back spasms and is unable to get additional pain medication at this time. Pt appearing to be very lethargic and having difficulty keeping eyes open. Pt is agreeable to roll L <> R for therapist to place k pad for pain management. Pt rolling with max A in both directions this session with cuing to maintain precautions. HOB placed flat with LEs elevated slightly as well to help with lower back spasms. Bed alarm activated and call bell within reach upon exiting the room.   Therapy Documentation Precautions:  Precautions Precautions: Fall Required Braces or Orthoses: Sling Restrictions Weight Bearing Restrictions: Yes LUE Weight Bearing: Weight bear through elbow only LLE Weight Bearing: Weight bearing as tolerated Other Position/Activity Restrictions: no L hip active abduction General: General PT Missed Treatment Reason: Pain Vital Signs: Therapy Vitals Temp: 97.7 F (36.5 C) Pulse Rate: 98 Resp: 20 BP: 139/71 Patient Position (if appropriate): Lying Oxygen Therapy SpO2: (!) 82 % O2 Device: Room Air ADL: ADL Grooming: Minimal assistance Where Assessed-Grooming: Edge of bed Upper Body Bathing:  Minimal assistance Where Assessed-Upper Body Bathing: Edge of bed Lower Body Bathing: Maximal assistance Where Assessed-Lower Body Bathing: Edge of bed Upper Body Dressing: Supervision/safety Where Assessed-Upper Body Dressing: Edge of bed Lower Body Dressing: (2 helper sit to stand) Where Assessed-Lower Body Dressing: Edge of bed   Therapy/Group: Individual Therapy  Gypsy Decant 12/25/2019, 1:26 PM

## 2019-12-25 NOTE — Progress Notes (Signed)
[facility] PHYSICAL MEDICINE & REHABILITATION PROGRESS NOTE   Subjective/Complaints: Still having a lot of pain. Talked with her about anxiety contributing and she admitted to this. Therapy has noticed this as well.   ROS: Patient denies fever, rash, sore throat, blurred vision, nausea, vomiting, diarrhea, cough, shortness of breath or chest pain,   headache, or mood change.   Objective:   No results found. Recent Labs    12/25/19 0621  WBC 10.3  HGB 8.9*  HCT 30.2*  PLT 282   Recent Labs    12/25/19 0621  NA 127*  K 4.4  CL 96*  CO2 19*  GLUCOSE 102*  BUN 50*  CREATININE 1.43*  CALCIUM 8.7*    Intake/Output Summary (Last 24 hours) at 12/25/2019 1021 Last data filed at 12/25/2019 0816 Gross per 24 hour  Intake 790 ml  Output 950 ml  Net -160 ml     Physical Exam: Vital Signs Blood pressure 127/63, pulse 91, temperature 98.2 F (36.8 C), resp. rate 17, height 5\' 1"  (1.549 m), weight 87.1 kg, SpO2 90 %. Constitutional: No distress . Vital signs reviewed. HEENT: EOMI, oral membranes moist Neck: supple Cardiovascular: RRR without murmur. No JVD    Respiratory: CTA Bilaterally without wheezes or rales. Normal effort    GI: BS +, non-tender, non-distended  Musculoskeletal:     Comments: Generalized edema LUE (improving) and LLE,  Left leg remains tender with minimal PROM, Low back with mild TTP Neurological: appears a little groggy but is oriented x 3. Follows full commands. Motor: RUE: 4-/5 proximal to distal LLE: 1-4/5 limited proximally by pain LUE: proximally limited by splint, hand grip 2/5 limited by splint/swelling LLE: 2+/5 proximal to distal Sensation diminished to light touch b/le feet  Skin:  Left hip incision with dry dressing, decreasing drainage Left upper extremity with sugar tong splint in place. Scattered ecchymoses as above on all 4 limbs, especially in UE's.--slowly resolving Psychiatric: alert, appears a little anxious  Assessment/Plan: 1.  Functional deficits secondary to polytrauma which require 3+ hours per day of interdisciplinary therapy in a comprehensive inpatient rehab setting.  Physiatrist is providing close team supervision and 24 hour management of active medical problems listed below.  Physiatrist and rehab team continue to assess barriers to discharge/monitor patient progress toward functional and medical goals  Care Tool:  Bathing    Body parts bathed by patient: Left arm, Chest, Abdomen, Front perineal area, Right upper leg, Left upper leg, Face   Body parts bathed by helper: Right arm, Buttocks Body parts n/a: Right lower leg, Left lower leg   Bathing assist Assist Level: Maximal Assistance - Patient 24 - 49%     Upper Body Dressing/Undressing Upper body dressing   What is the patient wearing?: Pull over shirt    Upper body assist Assist Level: Moderate Assistance - Patient 50 - 74%    Lower Body Dressing/Undressing Lower body dressing      What is the patient wearing?: Pants     Lower body assist Assist for lower body dressing: 2 Helpers     Toileting Toileting Toileting Activity did not occur (Clothing management and hygiene only): N/A (no void or bm)  Toileting assist Assist for toileting: Total Assistance - Patient < 25%     Transfers Chair/bed transfer  Transfers assist  Chair/bed transfer activity did not occur: Safety/medical concerns  Chair/bed transfer assist level: Minimal Assistance - Patient > 75%     Locomotion Ambulation   Ambulation assist  Ambulation activity did not occur: Safety/medical concerns          Walk 10 feet activity   Assist  Walk 10 feet activity did not occur: Safety/medical concerns        Walk 50 feet activity   Assist Walk 50 feet with 2 turns activity did not occur: Safety/medical concerns         Walk 150 feet activity   Assist Walk 150 feet activity did not occur: Safety/medical concerns         Walk 10 feet on  uneven surface  activity   Assist Walk 10 feet on uneven surfaces activity did not occur: Safety/medical concerns         Wheelchair     Assist     Wheelchair activity did not occur: Safety/medical concerns         Wheelchair 50 feet with 2 turns activity    Assist    Wheelchair 50 feet with 2 turns activity did not occur: Safety/medical concerns       Wheelchair 150 feet activity     Assist  Wheelchair 150 feet activity did not occur: Safety/medical concerns       Blood pressure 127/63, pulse 91, temperature 98.2 F (36.8 C), resp. rate 17, height 5\' 1"  (1.549 m), weight 87.1 kg, SpO2 90 %.  Medical Problem List and Plan: 1.  Decreased functional mobility secondary to left displaced femoral neck fracture status post left total hip replacement anterior approach as well as severely left comminuted distal radius fracture status post ORIF 12/08/2019 as well as forearm laceration repair.  Weightbearing as tolerated through left elbow only and weightbearing as tolerated left lower extremity.  No left hip abduction. Most recent CT from 1/27 revealed fx's of left SPR/IPR and left sacrum.              -patient may  shower if left arm and hip wound covered            -limited by pain and anxiety-see below            -Continue CIR therapies including PT, OT   -adjust dc date potentially based on her slow progress 2.  Antithrombotics: -DVT/anticoagulation: SCDs.  Dopplers negative  1/29 given her lack of mobility this week, will add lovenox for dvt prophylaxis 40mg  daily             -antiplatelet therapy: Aspirin 325 mg daily 3. Pain Management: Hydrocodone and Robaxin as needed              1/23-added ms contin 15mg  q12 for now d/t significant pain at rest/with activity  1/26: continued significant low back/left buttock pain. I reviewed her images and saw a left SPR fracture.    -increase robaxin to 750mg  and schedule QID  1/27: norco increased   1/28: ortho recs  weight bearing as tolerated for now.   -increase ms contin to 30mg  q12  1/29: appears lethargic today. Will change robaxin to 500mg  q6 PRN   -continue same MS contin for now   -encouraged use of heat, ice to area (she already has kpad)  2/1: had a discussion about her pain and possible contribution of anxiety to her pain. She agrees and is willing to try something to help 4. Mood: Prozac 40 mg daily  2/1: xanax 0.25mg  bid at 0700  and 1200 to help with mobility associated anxiety             -antipsychotic agents:  N/A 5. Neuropsych: This patient is capable of making decisions on her own behalf. 6. Skin/Wound Care: change hip dressing 7. Fluids/Electrolytes/Nutrition: intake poor  1/29 -reiterated the importance of adequate PO intake rightnow 8.  Polycystic kidney disease status post renal transplant 06/02/2012.  Baseline creatinine 1.02-1.29.  Continue Prograf, chronic prednisone as well as Myfortic.  Followed by Dr. Jimmy Footman            1/25: CMP stable except for low Na 131 and elevated Cr 1.21. Will repeat tomorrow to trend.    1/26 Sodium unchanged, BUN/Cr continue to drift up   -encourage fluids, hold lasix   -Na increased to 133  2/1 sodium down to 127   -dry by labs. Hold lasix 9.  Acute blood loss anemia.  Transfused 2 units packed red blood cells 12/11/2019.               Hgb holding at 9.8 on 1.22, 9.2 on 1/25  2/1: hgb 8.9 today 10.  Asthma.  Continue inhalers as directed. 11.  History of gout.  Allopurinol daily.  Monitor for any gout flareups 12.  Chronic diastolic congestive heart failure.  Lasix 40 mg daily. Monitor for sign/symptoms of fluid overload.    1/26-?trend, unsure about weights---need more together to assess for trend, she was -20cc for today by I/O's yesterday  -holding lasix at least for tomorrow d/t #8  1/29, 1/30 weight stable  1/31: increased weight, does appear to have increased work of breathing. Received Lasix 40 this morning. Will add additional 20mg  now.    2/1: weight essentially the same from yesterday. However  Filed Weights   12/21/19 0500 12/24/19 0526 12/25/19 0535  Weight: 85 kg 86.9 kg 87.1 kg    13. Constipation: sorbitol, SSE   if needed  -scheduled senna-s  -large BM 1/30   LOS: 10 days A FACE TO [place] 12/25/2019, 10:21 AM

## 2019-12-25 NOTE — Progress Notes (Signed)
Social Work Patient ID: April [name], female   DOB: [birth year], 65 y.o.   MRN: [id number]   SW received updates from medical team to reschedule family education tomorrow due to pt pain and anxiety. Reports indicate Xanax to begin today for pt to hopefully counteract these behaviors.   SW left message for pt dtr April [name] [phone]) to inform on rescheduling family education. SW waiting on follow-up.   SW received return phone call from pt dtr April [name] to discuss above. Reports she would still like to come in to observe pt progress and has questions related to medications. SW will follow-up after speaking with team members. *medical team agreeable for family to come in for family education to observe pt. SW spoke with pt dtr April [name] who states she and father will be in tomorrow. Family also intends to bring in w/c pt uses at home. SW informed medical team on updates.   Loralee Pacas, MSW Office: [phone] Cell: [phone]

## 2019-12-25 NOTE — Progress Notes (Signed)
Physical Therapy Session Note  Patient Details  Name: April [name] MRN: [id number] Date of Birth: 05/29/1956  Today's Date: 12/25/2019 PT Individual Time: 0900-0930 AND 1440-1515 PT Individual Time Calculation (min): 30 min  And 30 min Today's Date: 12/25/2019 PT Missed Time: 11 Minutes Missed Time Reason: Pain  Short Term Goals: Week 2:  PT Short Term Goal 1 (Week 2): =LTGs due to ELOS  Skilled Therapeutic Interventions/Progress Updates:   Session 1:  Pt in supine and agreeable to therapy, pain 5/10 at rest in L hip/pelvis region. Introduced pain science theories in light of her pain and anxiety related to movement. Pt endorsed anxiety w/ movement and understands how that can contribute and exacerbate the very real pain she is feeling. Discussed fracture/bone healing and importance of WB to promote healing correctly. Pt verbalized understanding of education and agreeable to attempt OOB activity. Donned pants in supine w/ total assist, R roll w/ min assist and remained in this position for 1-2 min w/ verbal cues for breathing strategies and tactile cues for muscle relaxation at quad and hamstring, pt w/ mild improvement in pain and noticeably less tense. R sidelying to seated w/ min-mod assist and verbal/tactile cues for technique. Pt w/ increasing pain in this position, held for 5 min w/ supervision, therapist providing verbal and visual cues for breathing strategies and gentle PROM to L knee to promote muscle relaxation and to minimize spasms. This also helped to improve pain, however pain continued to remain increased compared to supine. Pt agreeable to attempt sit>stand to [place] w/ max encouragement. Sit>stand w/ min-mod assist and CGA to maintain static stance for 5-10 sec. Pt yelling out and crying in pain, tactile and verbal cues to reach full upright. Attempted to guide pt w/ breathing strategies to reduce pain in standing, pt reported she had to sit and couldn't push any further despite  encouragement. Stand>sit w/ min assist and mod assist sit>supine. Once in supine, pt's pain subsided back to 5/10. Discussed that she can likely tolerate standing since she tolerates bed mobility much better today vs in previous sessions, however will need to work through the anxiety of standing and anticipation of pain. Provided w/ aromatherapy for pain relief as pt stated this helped her the other day. Pt very apologetic, therapist provided encouragement and appreciation for her being willing to attempt OOB mobility w/ therapist. Pt ended session in hooklying position in supine for pain relief, heat applied to posterior L thigh for spasm relief. Missed 30 min of skilled PT 2/2 pain.   Session 2:  Pt in supine, attempting to void in urinal. Tilted bed to assist w/ bladder emptying and pt was more successful. R/L rolling w/ total assist to replace brief. Pt agreeable to therapy w/ encouragement, pain 5/10 in L hip. Pt's husband present for session. Educated him on pain management strategies therapy has been using, pt's fractures and contribution to pain, contribution of anxiety on pain, and importance of WB and activity to promote bone healing. Pt agreeable to attempt supine>sit again this session. Supine>sit w/ mod assist and maintained static sitting at EOB for 3-4 min. Therapist observing BLEs spasms and verbal/tactile cues for progressive muscle relaxation, pt now 10/10 pain and grimacing/crying at EOB. BLEs spasms did not subside until therapist assisted pt to supine. Total assist to boost up in bed. Provided encouragement to patient and thanked her for trying to get up during both PT sessions today. Reinforced mindfulness and relaxation strategies for pain management. Emphasized internal locus  of control. Ended session in supine, all needs in reach. Heat applied to lower back and L hip for pain relief. Missed 25 min of skilled PT 2/2 pain.   Therapy Documentation Precautions:  Precautions Precautions:  Fall Required Braces or Orthoses: Sling Restrictions Weight Bearing Restrictions: Yes LUE Weight Bearing: Weight bear through elbow only LLE Weight Bearing: Weight bearing as tolerated Other Position/Activity Restrictions: no L hip active abduction General: PT Amount of Missed Time (min): 30 Minutes PT Missed Treatment Reason: Pain Pain: Pain Assessment Pain Scale: 0-10 Pain Score: 4  Pain Type: Acute pain Pain Location: Hip Pain Orientation: Right;Left Pain Radiating Towards: pelvis Pain Descriptors / Indicators: Sharp;Stabbing Pain Frequency: Constant Pain Onset: On-going Pain Intervention(s): Medication (See eMAR)  Therapy/Group: Individual Therapy  Chamara Dyck Clent Demark 12/25/2019, 9:42 AM

## 2019-12-26 ENCOUNTER — Encounter (HOSPITAL_COMMUNITY): Payer: Medicare Other | Admitting: Psychology

## 2019-12-26 ENCOUNTER — Encounter (HOSPITAL_COMMUNITY): Payer: Medicare Other

## 2019-12-26 ENCOUNTER — Inpatient Hospital Stay (HOSPITAL_COMMUNITY): Payer: Medicare Other

## 2019-12-26 ENCOUNTER — Inpatient Hospital Stay (HOSPITAL_COMMUNITY): Payer: Medicare Other | Admitting: Physical Therapy

## 2019-12-26 DIAGNOSIS — S32512A Fracture of superior rim of left pubis, initial encounter for closed fracture: Secondary | ICD-10-CM

## 2019-12-26 DIAGNOSIS — R52 Pain, unspecified: Secondary | ICD-10-CM

## 2019-12-26 DIAGNOSIS — S32512S Fracture of superior rim of left pubis, sequela: Secondary | ICD-10-CM

## 2019-12-26 DIAGNOSIS — F411 Generalized anxiety disorder: Secondary | ICD-10-CM

## 2019-12-26 LAB — BASIC METABOLIC PANEL
Anion gap: 9 (ref 5–15)
BUN: 54 mg/dL — ABNORMAL HIGH (ref 8–23)
CO2: 25 mmol/L (ref 22–32)
Calcium: 8.8 mg/dL — ABNORMAL LOW (ref 8.9–10.3)
Chloride: 93 mmol/L — ABNORMAL LOW (ref 98–111)
Creatinine, Ser: 1.53 mg/dL — ABNORMAL HIGH (ref 0.44–1.00)
GFR calc Af Amer: 42 mL/min — ABNORMAL LOW (ref >60)
GFR calc non Af Amer: 36 mL/min — ABNORMAL LOW (ref >60)
Glucose, Bld: 95 mg/dL (ref 70–99)
Potassium: 4.2 mmol/L (ref 3.5–5.1)
Sodium: 127 mmol/L — ABNORMAL LOW (ref 135–145)

## 2019-12-26 MED ORDER — MORPHINE SULFATE ER 15 MG PO TBCR
15.0000 mg | EXTENDED_RELEASE_TABLET | Freq: Two times a day (BID) | ORAL | Status: DC
  Administered 2019-12-26 – 2019-12-28 (×4): 15 mg via ORAL
  Filled 2019-12-26 (×4): qty 1

## 2019-12-26 MED ORDER — HYDROCODONE-ACETAMINOPHEN 5-325 MG PO TABS
1.0000 | ORAL_TABLET | ORAL | Status: DC | PRN
  Administered 2019-12-28 – 2020-01-05 (×13): 1 via ORAL
  Filled 2019-12-26 (×14): qty 1

## 2019-12-26 NOTE — Progress Notes (Signed)
[facility] PHYSICAL MEDICINE & REHABILITATION PROGRESS NOTE   Subjective/Complaints: Pain better to an extent but more lethargic, delays in processing.   ROS: Patient denies fever, rash, sore throat, blurred vision, nausea, vomiting, diarrhea, cough, shortness of breath or chest pain,  Headache .     Objective:   No results found. Recent Labs    12/25/19 0621  WBC 10.3  HGB 8.9*  HCT 30.2*  PLT 282   Recent Labs    12/25/19 0621 12/26/19 0532  NA 127* 127*  K 4.4 4.2  CL 96* 93*  CO2 19* 25  GLUCOSE 102* 95  BUN 50* 54*  CREATININE 1.43* 1.53*  CALCIUM 8.7* 8.8*    Intake/Output Summary (Last 24 hours) at 12/26/2019 1024 Last data filed at 12/26/2019 0900 Gross per 24 hour  Intake 570 ml  Output 535 ml  Net 35 ml     Physical Exam: Vital Signs Blood pressure 127/70, pulse 90, temperature 97.6 F (36.4 C), temperature source Oral, resp. rate 16, height 5\' 1"  (1.549 m), weight 86.6 kg, SpO2 97 %. Constitutional: No distress . Vital signs reviewed. obese HEENT: EOMI, oral membranes moist Neck: supple Cardiovascular: RRR without murmur. No JVD    Respiratory: CTA Bilaterally without wheezes or rales. Normal effort    GI: BS +, non-tender, non-distended  Musculoskeletal:     Comments: edema actually decreased.  Left leg remains tender with minimal PROM, Low back with mild TTP Neurological: appears a little groggy but is oriented x 3. Follows full commands. Motor: RUE: 4-/5 proximal to distal LLE: 1-4/5 limited proximally by pain LUE: proximally limited by splint, hand grip 2/5 limited by splint/swelling LLE: 2+/5 proximal to distal Sensation diminished to light touch b/le feet  Skin:  Left hip incision with decreasing drainage Left upper extremity with sugar tong splint in place. Scattered ecchymoses as above on all 4 limbs, especially in UE's.--all decreased Psychiatric: alert but slow to process, has a wide-eyed stare at times  Assessment/Plan: 1.  Functional deficits secondary to polytrauma which require 3+ hours per day of interdisciplinary therapy in a comprehensive inpatient rehab setting.  Physiatrist is providing close team supervision and 24 hour management of active medical problems listed below.  Physiatrist and rehab team continue to assess barriers to discharge/monitor patient progress toward functional and medical goals  Care Tool:  Bathing    Body parts bathed by patient: Left arm, Chest, Abdomen, Front perineal area, Right upper leg, Left upper leg, Face   Body parts bathed by helper: Right arm, Buttocks Body parts n/a: Right lower leg, Left lower leg   Bathing assist Assist Level: Maximal Assistance - Patient 24 - 49%     Upper Body Dressing/Undressing Upper body dressing   What is the patient wearing?: Pull over shirt    Upper body assist Assist Level: Moderate Assistance - Patient 50 - 74%    Lower Body Dressing/Undressing Lower body dressing      What is the patient wearing?: Pants     Lower body assist Assist for lower body dressing: 2 Helpers     Toileting Toileting Toileting Activity did not occur (Clothing management and hygiene only): N/A (no void or bm)  Toileting assist Assist for toileting: Total Assistance - Patient < 25%     Transfers Chair/bed transfer  Transfers assist  Chair/bed transfer activity did not occur: Safety/medical concerns  Chair/bed transfer assist level: Minimal Assistance - Patient > 75%     Locomotion Ambulation   Ambulation assist  Ambulation activity did not occur: Safety/medical concerns          Walk 10 feet activity   Assist  Walk 10 feet activity did not occur: Safety/medical concerns        Walk 50 feet activity   Assist Walk 50 feet with 2 turns activity did not occur: Safety/medical concerns         Walk 150 feet activity   Assist Walk 150 feet activity did not occur: Safety/medical concerns         Walk 10 feet on  uneven surface  activity   Assist Walk 10 feet on uneven surfaces activity did not occur: Safety/medical concerns         Wheelchair     Assist     Wheelchair activity did not occur: Safety/medical concerns         Wheelchair 50 feet with 2 turns activity    Assist    Wheelchair 50 feet with 2 turns activity did not occur: Safety/medical concerns       Wheelchair 150 feet activity     Assist  Wheelchair 150 feet activity did not occur: Safety/medical concerns       Blood pressure 127/70, pulse 90, temperature 97.6 F (36.4 C), temperature source Oral, resp. rate 16, height 5\' 1"  (1.549 m), weight 86.6 kg, SpO2 97 %.  Medical Problem List and Plan: 1.  Decreased functional mobility secondary to left displaced femoral neck fracture status post left total hip replacement anterior approach as well as severely left comminuted distal radius fracture status post ORIF 12/08/2019 as well as forearm laceration repair.  Weightbearing as tolerated through left elbow only and weightbearing as tolerated left lower extremity.  No left hip abduction. Most recent CT from 1/27 revealed fx's of left SPR/IPR and left sacrum.              -patient may  shower if left arm and hip wound covered            -limited by pain and anxiety-see below            -Continue CIR therapies including PT, OT   -team conference today, adjust dc date to 2/10 given pain/lethargy   -family ed 2.  Antithrombotics: -DVT/anticoagulation: SCDs.  Dopplers negative  1/29 given her lack of mobility this week, will add lovenox for dvt prophylaxis 40mg  daily             -antiplatelet therapy: Aspirin 325 mg daily 3. Pain Management: Hydrocodone and Robaxin as needed              1/23-added ms contin 15mg  q12 for now d/t significant pain at rest/with activity  1/26: continued significant low back/left buttock pain. I reviewed her images and saw a left SPR fracture.    -increase robaxin to 750mg  and  schedule QID  1/27: norco increased   1/28: ortho recs weight bearing as tolerated for now.   -increase ms contin to 30mg  q12  1/29: appears lethargic today. Will change robaxin to 500mg  q6 PRN   -continue same MS contin for now   -encouraged use of heat, ice to area (she already has kpad)  2/2: more lethargic, likely d/t meds   -reduce ms contin to 15mg  q12 and hydrocodone to 5/325 q4 prn 4. Mood: Prozac 40 mg daily  2/1: xanax 0.25mg  bid at 0700  and 1200 to help with mobility associated anxiety             -  antipsychotic agents: N/A 5. Neuropsych: This patient is capable of making decisions on her own behalf. 6. Skin/Wound Care: change hip dressing 7. Fluids/Electrolytes/Nutrition: intake poor  1/29 -reiterated the importance of adequate PO intake rightnow 8.  Polycystic kidney disease status post renal transplant 06/02/2012.  Baseline creatinine 1.02-1.29.  Continue Prograf, chronic prednisone as well as Myfortic.  Followed by Dr. Jimmy Footman            1/25: CMP stable except for low Na 131 and elevated Cr 1.21. Will repeat tomorrow to trend.    1/26 Sodium unchanged, BUN/Cr continue to drift up   -encourage fluids, hold lasix   -Na increased to 133  2/1 sodium down to 127   -dry by labs. Hold lasix  2/2 sodium holding at 127 today   -she is still -2600cc for admit, weight down from 1/31    -will not resume lasix at this time   -check bmet serially 9.  Acute blood loss anemia.  Transfused 2 units packed red blood cells 12/11/2019.               Hgb holding at 9.8 on 1.22, 9.2 on 1/25  2/1: hgb 8.9   10.  Asthma.  Continue inhalers as directed. 11.  History of gout.  Allopurinol daily.  Monitor for any gout flareups 12.  Chronic diastolic congestive heart failure.  Lasix 40 mg daily. Monitor for sign/symptoms of fluid overload.    1/26-?trend, unsure about weights---need more together to assess for trend, she was -20cc for today by I/O's yesterday  -holding lasix at least for  tomorrow d/t #8  1/29, 1/30 weight stable  1/31: increased weight, does appear to have increased work of breathing. Received Lasix 40 this morning. Will add additional 20mg  now.   2/2: weight down today  Filed Weights   12/24/19 0526 12/25/19 0535 12/26/19 0500  Weight: 86.9 kg 87.1 kg 86.6 kg    13. Constipation: sorbitol, SSE   if needed  -scheduled senna-s  -large BM 1/30   LOS: 11 days A FACE TO [place] 12/26/2019, 10:24 AM

## 2019-12-26 NOTE — Consult Note (Signed)
Neuropsychological Consultation   Patient:   April [name]   DOB:   09-14-1956  MR Number:  [id number]  Location:  [facility] [place] [id number] [place] [address] Dept: [place]: [phone]           Date of Service:   12/26/19  Start Time:   2 PM End Time:   3 PM  Provider/Observer:  Ilean Skill, Psy.D.       Clinical Neuropsychologist       Billing Code/Service: 42595  Chief Complaint:    April [name]. Nakagawa is a 64 year old female with history of polycystic kidney disease, chronic diastolic CHF, ESRD with transplant 06/02/12.  Presented on 12/07/19 after fall in her kitchen without LOC, landing on her left side.  Patient with displaced femoral neck fracture as well as severely comminuted distal radius fracture on the left.  Patient with pelvis fractures as well.  Dr. Berenice Primas with orthopedic interventions including total hip.  Patient now at the CIR due to deficits during healing process.  Patient having a lot of anxiety and worry about falling and reacting to movement when experience pain.    Reason for Service:  Patient referred for neuropsychological consultation due to coping and adjustment issues and anxiety and pain management issues.  Below is the HPI for the current admission.  HPI: April [name]. Klinger is a 64 year old right-handed female with history of polycystic kidney disease, chronic diastolic congestive heart failure, end-stage renal disease post renal transplant 06/02/2012 with baseline creatinine 1.02-1.29 maintained on Prograf, chronic prednisone as well as Myfortic followed by renal service Dr. Jimmy Footman, asthma, remote tobacco abuse 21 years ago, hyperlipidemia.  History taken from chart review and patient. Patient lives with spouse and family.  Two-level home bed and bath on main level with ramped entrance.  Independent with rolling walker prior to admission.  Presented  12/07/2019 after fall in her kitchen without LOC, landing on her left side. X-rays and imaging revealed left displaced femoral neck fracture as well as severely comminuted distal radius fracture on the left.  Underwent left total hip replacement anterior approach as well as ORIF left distal radius fracture 12/08/2019 per Dr. Berenice Primas as well as repair of forearm laceration.  Hospital course further complicated by pain. Weightbearing as tolerated through elbow only of left upper extremity weightbearing as tolerated left lower extremity.  No left hip abduction.  Acute blood loss anemia status post 2 units blood transfusion 12/11/2019 with latest hemoglobin 9.8 on 12/15/2019  Maintained on aspirin 325 mg daily for DVT prophylaxis.  Her renal function remained stable with latest creatinine 0.91.  Therapy evaluations completed and patient was admitted for a comprehensive rehab program. Please see preadmission assessment from earlier today as well.   Current Status:  Patient was drowsy today but oriented and aware of what has happened with fall.  Patient was very jittery and anxious about pain when moving and talked about fear of falling again and what might happen.  Reports that this anxiety is greater than past anxiety.  Admits that anxiety is holding her back in therapy.     Behavioral Observation: MARITSSA HAUGHTON  presents as a 64 y.o.-year-old Right Caucasian Female who appeared her stated age. her dress was Appropriate and she was Well Groomed and her manners were Appropriate to the situation.  her participation was indicative of Appropriate, Drowsy and Redirectable behaviors.  There were any physical disabilities noted.  she displayed an appropriate level of cooperation and motivation.     Interactions:    Active Appropriate, Drowsy and Redirectable  Attention:   abnormal and attention span appeared shorter than expected for age  Memory:   within normal limits; recent and remote memory  intact  Visuo-spatial:  not examined  Speech (Volume):  low  Speech:   normal; normal  Thought Process:  Coherent  Though Content:  Rumination; not suicidal and not homicidal  Orientation:   person, place, time/date and situation  Judgment:   Fair  Planning:   Poor  Affect:    Anxious  Mood:    Anxious  Insight:   Fair  Intelligence:   normal  Medical History:   Past Medical History:  Diagnosis Date  . Arthritis   . Asthma   . Depression   . GERD (gastroesophageal reflux disease)   . Hyperlipidemia   . Hyperparathyroidism   . Polycystic kidney   . PONV (postoperative nausea and vomiting)    Psychiatric History:  Patient has history of depressive disorder and likely prior issues with anxiety  Family Med/Psych History:  Family History  Problem Relation Age of Onset  . Hypertension Mother   . Heart disease Father        CABG history  . Polycystic kidney disease Father   . Polycystic kidney disease Brother    Impression/DX:  April [name] is a 64 year old female with history of polycystic kidney disease, chronic diastolic CHF, ESRD with transplant 06/02/12.  Presented on 12/07/19 after fall in her kitchen without LOC, landing on her left side.  Patient with displaced femoral neck fracture as well as severely comminuted distal radius fracture on the left.  Patient with pelvis fractures as well.  Dr. Berenice Primas with orthopedic interventions including total hip.  Patient now at the CIR due to deficits during healing process.  Patient having a lot of anxiety and worry about falling and reacting to movement when experience pain.  Patient was drowsy today but oriented and aware of what has happened with fall.  Patient was very jittery and anxious about pain when moving and talked about fear of falling again and what might happen.  Reports that this anxiety is greater than past anxiety.  Admits that anxiety is holding her back in therapy.   Disposition/Plan:  Worked on  anxiety and fear that is exacerbated by pain.  Low tolerance to pain and fear of falling are intrusive thoughts.  Reiterated efforts of PT around pain management.  Worked on cognitive side of fear and anxiety and coping with need for PT/OT to get functional improvements for reduced fall risk in future vs avoidance any movements that trigger acute increase in pain.  Diagnosis:    Low back pain  Fracture of left superior pubic ramus (HCC)  Pain   Anxiety exacerbated by pain and fear of falling.         Electronically Signed   _______________________ Ilean Skill, Psy.D.

## 2019-12-26 NOTE — Progress Notes (Signed)
Occupational Therapy Session Note  Patient Details  Name: April [name] MRN: [id number] Date of Birth: 27-Mar-1956  Today's Date: 12/26/2019 OT Individual Time: 0906-1001 OT Individual Time Calculation (min): 55 min    Session 2: OT Individual Time: 6754-4920 OT Individual Time Calculation (min): 45 min    Short Term Goals: Week 2:  OT Short Term Goal 1 (Week 2): Pt will transfer to [facility] wiht MAX A of 1 and LRAD OT Short Term Goal 2 (Week 2): Pt will tolerate sitting EOB/getting OOB at least 1x per day OT Short Term Goal 3 (Week 2): Pt will thread 1LE into pants wiht AE PRN OT Short Term Goal 4 (Week 2): Pt will demo alternative pain management strategies with min VC to improve participation in mobility/ADLs  Skilled Therapeutic Interventions/Progress Updates:    Pt's daughter and husband present for family education session. Extensive edu and discussion re previously attempted pain management strategies. Discussed d/c and help available at home. Pt completed supine > side lying with mod A. Rest breaks required throughout session for pain management, with cueing for breathing provided. Pt transitioned into sitting EOB with mod A. Pt c/o pain increasing, described as back spasms and required return to sidelying. Positioned pt with pressure relief off of back and discussed need to do this frequently for skin integrity. After discussion with family pt was willing to attempt again. She transitioned back to EOB with mod A again. With mod cueing for initiation, pt completed sit > stand with PFRW with min +2 assist. Daughter assisting, providing pt with increased assurance/ perceived support. Pt able to complete stand pivot transfer with mod cueing, min A overall to recliner. +2 assist to position pt in recliner 2/2 short stature. Multiple angles of the back reclined were attempted and pt could only really tolerate almost supine positioning. Discussed progression of therapy from here on and OT POC. Pt  left seated, all the way reclined, with family present, all needs within reach.   Session 2: Pt received supine in bed with HOB elevated to 20 degrees, requesting to return to flat for pain relief. Daughter and husband present. Pt agreeable to bed level ADLs. Pt completed UB bathing with min A, requiring cueing for LUE use and technique. Pt donned shirt with mod A at bed level. Pt rolled R and L with mod A for peri care. Pt able to reach around posteriorly to wash her bottom with mod A. New brief donned with max A. Pt required rest rbeaks for pain relief throughout session. Areas of redness/minor skin breakfown observed on pts lower back and bottom. Barrier cream applied and powder for moisture control. Total A provided to pull pt up in bed to ensure proper positioning with HOB raised. Pt was left supine, with HOB raised to 24 degrees and pillow placed under R hip for pressure relief. Family present and all needs met.   Therapy Documentation Precautions:  Precautions Precautions: Fall Required Braces or Orthoses: Sling Restrictions Weight Bearing Restrictions: Yes LUE Weight Bearing: Weight bear through elbow only LLE Weight Bearing: Weight bearing as tolerated Other Position/Activity Restrictions: no L hip active abduction   Therapy/Group: Individual Therapy  Curtis Sites 12/26/2019, 6:31 AM

## 2019-12-26 NOTE — Progress Notes (Signed)
Social Work  Patient ID: April [name], female   DOB: 02/27/1956, 63 y.o.   MRN: [id number]    SW met with pt, pt husband and pt dtr [name] in room to discuss updates from team meeting, extending d/c date to 01/02/2020. SW to follow up as there are more updates on recommendations with pt care.   Auria Chamberlain, MSW, Social Worker Office (preferred): [phone] Cell: [phone] Fax: [phone]   

## 2019-12-26 NOTE — Patient Care Conference (Signed)
Inpatient RehabilitationTeam Conference and Plan of Care Update Date: 12/26/2019   Time: 10:05 AM   Patient Name: April [name]      Medical Record Number: [id number]  Date of Birth: [birth year] Sex: Female         Room/Bed: 4W09C/4W09C-01 Payor Info: Payor: [employer] MEDICARE / Plan: [facility] MEDICARE / Product Type: *No Product type* /    Admit Date/Time:  12/15/2019  4:47 PM  Primary Diagnosis:  Left displaced femoral neck fracture [facility])  Patient Active Problem List   Diagnosis Date Noted  . Fracture of left superior pubic ramus (HCC)   . Pain   . Anxiety state   . Left displaced femoral neck fracture ([place]) 12/15/2019  . Status post total hip replacement, left   . Post-op pain   . Acute blood loss anemia   . Polycystic kidney   . Closed left hip fracture ([facility]) 12/07/2019  . Left wrist fracture 12/07/2019  . Medication management 09/22/2019  . Physical deconditioning 09/22/2019  . Hyponatremia 06/29/2019  . Acute respiratory failure with hypoxia ([place]) 04/19/2019  . Acute on chronic diastolic CHF (congestive heart failure) ([place]) 04/19/2019  . Bilateral closed proximal tibial fracture 12/21/2018  . Closed right ankle fracture 12/21/2018  . Fracture 12/21/2018  . Lower urinary tract infectious disease 10/03/2018  . Asthma, chronic, unspecified asthma severity, with acute exacerbation 10/03/2018  . SIRS (systemic inflammatory response syndrome) ([place]) 10/03/2018  . Nausea vomiting and diarrhea 10/02/2018  . Chronic diastolic CHF (congestive heart failure) ([place]) 10/01/2017  . Influenza B 10/01/2017  . Persistent asthma with status asthmaticus   . Pneumonia 07/15/2016  . Asthma, mild intermittent 07/15/2016  . GERD (gastroesophageal reflux disease) 07/15/2016  . Renal transplant recipient 07/15/2016  . Hyperlipidemia 07/15/2016  . Depression 07/15/2016  . Bronchitis, mucopurulent recurrent ([place]) 08/08/2014  . Chronic cough 07/24/2014  . End stage renal disease ([place])  03/23/2012  . 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, implant, and graft 03/23/2012    Expected Discharge Date: Expected Discharge Date: 01/02/20  Team Members Present: Physician leading conference: Dr. Alger Simons Social Worker Present: Lennart Pall, [place], [place]) Nurse Present: Isla Pence, RN Case Manager: Karene Fry, RN PT Present: Burnard Bunting, PT OT Present: Laverle Hobby, OT SLP Present: Weston Anna, SLP PPS Coordinator present : Ileana Ladd, Burna Mortimer, SLP     Current Status/Progress Goal Weekly Team Focus  Bowel/Bladder   Pt is continent of bowel LBM 02/01 occasional bladder incontinence  Timed toileting for bladder regain bladder continence remain continent of bowel with normal bowel pattern  timed toilet laxatives prn   Swallow/Nutrition/ Hydration             ADL's   No longer tolerating OOB mobility or EOB for longer than ~1 min. Mod-max A ADLs overall 2/2 this  Supervision ADLs and transfers  OOB tolerance, pain management/anxiety, ADL retraining, ADL transfers   Mobility   min-mod assist bed mobility, is no longer tolerating sitting EOB or transfers, extremely limited by L hip pain and anxiety w/ mobility  supervision, w/c level  OOB tolerance, transfers, pain management strategies, education and d/c planning   Communication             Safety/Cognition/ Behavioral Observations            Pain      Pain <=3/10  assess pain qshift and prn, medicate as directed   Skin   skin tear to right arm ecchymosis to BUE staples  to left hip  skin remain intact and free of infection  assess skin qshift and prn    Rehab Goals Patient on target to meet rehab goals: No Rehab Goals Revised: Revised goals to help reduce patient anxiety and address pain issues *See Care Plan and progress notes for long and short-term goals.     Barriers to Discharge  Current Status/Progress Possible Resolutions Date Resolved   Nursing                   PT                    OT                  SLP                SW                Discharge Planning/Teaching Needs:  Plan is to discharge to home  Continued family education per rehab recommendations   Team Discussion: Pain issues, medication tolerance issues, anxiety, med started, lethargy, low Na+, med changes.  RN cont B/ B, incision ok, spasms legs.  OT session with fam today, min A in recliner, Dtr is OT, sedated from meds, husband present as well, mod/max ADLs this week, S goals.  PT min/mod bed up to max A, goals S, Dtr there occasionally, husband no help, son 52 yo with [employer].   Revisions to Treatment Plan: N/A     Medical Summary Current Status: pain issues, d/t numerous pelvic fx's. anxiety factor. more lethargic over last 24 likely d/t meds Weekly Focus/Goal: treat problems above  Barriers to Discharge: Behavior;Medical stability;Wound care   Possible Resolutions to Barriers: reducing meds   Continued Need for Acute Rehabilitation Level of Care: The patient requires daily medical management by a physician with specialized training in physical medicine and rehabilitation for the following reasons: Direction of a multidisciplinary physical rehabilitation program to maximize functional independence : Yes Medical management of patient stability for increased activity during participation in an intensive rehabilitation regime.: Yes Analysis of laboratory values and/or radiology reports with any subsequent need for medication adjustment and/or medical intervention. : Yes   I attest that I was present, lead the team conference, and concur with the assessment and plan of the team.   Retta Diones 12/28/2019, 9:56 AM   Team conference was held via web/ teleconference due to [place] - 19

## 2019-12-26 NOTE — Progress Notes (Signed)
Physical Therapy Session Note  Patient Details  Name: April [name] MRN: [id number] Date of Birth: 1956-05-08  Today's Date: 12/26/2019 PT Individual Time: 1030-1108 PT Individual Time Calculation (min): 38 min   Short Term Goals: Week 2:  PT Short Term Goal 1 (Week 2): =LTGs due to ELOS  Skilled Therapeutic Interventions/Progress Updates:   Pt received nearly supine in recliner, agreeable to therapy and pain 5/10 in L hip. Pt's daughter and husband present to observe session. Discussed goal of session to get back to bed and pt agreeable. Slowly brought legs of recliner down and brought back of recliner up ~50%. Pt w/ increased L hip pain and L HS spasm, verbal and visual cues for breathing strategies. Once in full upright, pt unable to tolerate position 2/2 muscle spasms despite max encouragement to push through and therapist providing gentle PROM to LLE for muscle spasm relief. Returned back to 50% recline briefly before re-attempting. Daughter present and also encouraging pt. Mod assist to scoot forward in chair once back in full upright and min assist sit>stand to [place]. Pt benefited from having 2nd helper there to encourage her and provide reassurance (daughter). Min assist stand pivot to EOB w/ PFRW. Pt performing lateral weight shifting well and actually maintaining a majority of her weight on L elbow and LE. No cues needed to maintain full upright standing, cues only needed for step placement. Sit>supine at EOB w/ mod assist. Once resting in supine, pain slowly returned to 5/10 and muscle spasms relaxed. Discussed POC w/ daughter and husband and continuing to push pt in therapy and OOB participation. Pt in agreement to make a goal of getting OOB at least once per day and sitting in full upright for a certain amount of time to increase tolerance to upright and WB through pelvis. Also discussed w/ daughter and pt importance of being able to tolerate ~30 min of full upright sitting for car  transport and also toileting on BSC. All in agreement w/ this plan and continuing to work towards supervision level goals - w/c level.   Therapy Documentation Precautions:  Precautions Precautions: Fall Required Braces or Orthoses: Sling Restrictions Weight Bearing Restrictions: Yes LUE Weight Bearing: Weight bear through elbow only LLE Weight Bearing: Weight bearing as tolerated Other Position/Activity Restrictions: no L hip active abduction  Therapy/Group: Individual Therapy  [name] 12/26/2019, 11:35 AM

## 2019-12-27 ENCOUNTER — Inpatient Hospital Stay (HOSPITAL_COMMUNITY): Payer: Medicare Other | Admitting: Physical Therapy

## 2019-12-27 ENCOUNTER — Inpatient Hospital Stay (HOSPITAL_COMMUNITY): Payer: Medicare Other

## 2019-12-27 LAB — BASIC METABOLIC PANEL
Anion gap: 13 (ref 5–15)
BUN: 50 mg/dL — ABNORMAL HIGH (ref 8–23)
CO2: 26 mmol/L (ref 22–32)
Calcium: 9.1 mg/dL (ref 8.9–10.3)
Chloride: 90 mmol/L — ABNORMAL LOW (ref 98–111)
Creatinine, Ser: 1.3 mg/dL — ABNORMAL HIGH (ref 0.44–1.00)
GFR calc Af Amer: 51 mL/min — ABNORMAL LOW (ref >60)
GFR calc non Af Amer: 44 mL/min — ABNORMAL LOW (ref >60)
Glucose, Bld: 89 mg/dL (ref 70–99)
Potassium: 4.2 mmol/L (ref 3.5–5.1)
Sodium: 129 mmol/L — ABNORMAL LOW (ref 135–145)

## 2019-12-27 MED ORDER — POLYETHYLENE GLYCOL 3350 17 G PO PACK
17.0000 g | PACK | Freq: Every day | ORAL | Status: DC
  Administered 2019-12-27 – 2019-12-30 (×4): 17 g via ORAL
  Filled 2019-12-27 (×5): qty 1

## 2019-12-27 NOTE — Progress Notes (Signed)
PHYSICAL MEDICINE & REHABILITATION PROGRESS NOTE   Subjective/Complaints: Continues to have pain. Medications were cut back due to sedation, and patient feels less sedated.  Minimal appetite this morning. Cannot remember when she last had BM.   ROS: Patient denies fever, rash, sore throat, blurred vision, nausea, vomiting, diarrhea, cough, shortness of breath or chest pain,  Headache .   Objective: No results found. Recent Labs    12/25/19 0621  WBC 10.3  HGB 8.9*  HCT 30.2*  PLT 282   Recent Labs    12/26/19 0532 12/27/19 0657  NA 127* 129*  K 4.2 4.2  CL 93* 90*  CO2 25 26  GLUCOSE 95 89  BUN 54* 50*  CREATININE 1.53* 1.30*  CALCIUM 8.8* 9.1    Intake/Output Summary (Last 24 hours) at 12/27/2019 1104 Last data filed at 12/27/2019 0745 Gross per 24 hour  Intake 330 ml  Output 1150 ml  Net -820 ml     Physical Exam: Vital Signs Blood pressure 139/62, pulse 92, temperature 97.7 F (36.5 C), resp. rate 18, height 5\' 1"  (1.549 m), weight 86.7 kg, SpO2 95 %. Constitutional: No distress . Vital signs reviewed. Obese. Lying in bed, appears tired and uncomfortable HEENT: EOMI, oral membranes moist Neck: supple Cardiovascular: RRR without murmur. No JVD    Respiratory: CTA Bilaterally without wheezes or rales. Normal effort    GI: BS +, non-tender, non-distended  Musculoskeletal:     Comments: edema actually decreased.  Left leg remains tender with minimal PROM, Low back with mild TTP Neurological: appears a little groggy but is oriented x 3. Follows full commands. Motor: RUE: 4-/5 proximal to distal LLE: 1-4/5 limited proximally by pain LUE: proximally limited by splint, hand grip 2/5 limited by splint/swelling LLE: 2+/5 proximal to distal Sensation diminished to light touch b/le feet  Skin:  Left hip incision with decreasing drainage Left upper extremity with sugar tong splint in place. Scattered ecchymoses as above on all 4 limbs, especially in  UE's.--all decreased Psychiatric: alert but slow to process, has a wide-eyed stare at times  Assessment/Plan: 1. Functional deficits secondary to polytrauma which require 3+ hours per day of interdisciplinary therapy in a comprehensive inpatient rehab setting.  Physiatrist is providing close team supervision and 24 hour management of active medical problems listed below.  Physiatrist and rehab team continue to assess barriers to discharge/monitor patient progress toward functional and medical goals  Care Tool:  Bathing    Body parts bathed by patient: Left arm, Chest, Abdomen, Front perineal area, Right upper leg, Left upper leg, Face, Right arm   Body parts bathed by helper: Buttocks, Right lower leg, Left lower leg Body parts n/a: Right lower leg, Left lower leg   Bathing assist Assist Level: Moderate Assistance - Patient 50 - 74%     Upper Body Dressing/Undressing Upper body dressing   What is the patient wearing?: Pull over shirt    Upper body assist Assist Level: Moderate Assistance - Patient 50 - 74%    Lower Body Dressing/Undressing Lower body dressing      What is the patient wearing?: Incontinence brief     Lower body assist Assist for lower body dressing: Maximal Assistance - Patient 25 - 49%     Toileting Toileting Toileting Activity did not occur (Clothing management and hygiene only): N/A (no void or bm)  Toileting assist Assist for toileting: Total Assistance - Patient < 25%     Transfers Chair/bed transfer  Transfers assist  Chair/bed transfer activity did not occur: Safety/medical concerns  Chair/bed transfer assist level: Maximal Assistance - Patient 25 - 49%     Locomotion Ambulation   Ambulation assist   Ambulation activity did not occur: Safety/medical concerns          Walk 10 feet activity   Assist  Walk 10 feet activity did not occur: Safety/medical concerns        Walk 50 feet activity   Assist Walk 50 feet with 2  turns activity did not occur: Safety/medical concerns         Walk 150 feet activity   Assist Walk 150 feet activity did not occur: Safety/medical concerns         Walk 10 feet on uneven surface  activity   Assist Walk 10 feet on uneven surfaces activity did not occur: Safety/medical concerns         Wheelchair     Assist     Wheelchair activity did not occur: Safety/medical concerns         Wheelchair 50 feet with 2 turns activity    Assist    Wheelchair 50 feet with 2 turns activity did not occur: Safety/medical concerns       Wheelchair 150 feet activity     Assist  Wheelchair 150 feet activity did not occur: Safety/medical concerns       Blood pressure 139/62, pulse 92, temperature 97.7 F (36.5 C), resp. rate 18, height 5\' 1"  (1.549 m), weight 86.7 kg, SpO2 95 %.  Medical Problem List and Plan: 1.  Decreased functional mobility secondary to left displaced femoral neck fracture status post left total hip replacement anterior approach as well as severely left comminuted distal radius fracture status post ORIF 12/08/2019 as well as forearm laceration repair.  Weightbearing as tolerated through left elbow only and weightbearing as tolerated left lower extremity.  No left hip abduction. Most recent CT from 1/27 revealed fx's of left SPR/IPR and left sacrum.              -patient may  shower if left arm and hip wound covered            -limited by pain and anxiety-see below            -Continue CIR therapies including PT, OT  2.  Antithrombotics: -DVT/anticoagulation: SCDs.  Dopplers negative  1/29 given her lack of mobility this week, will add lovenox for dvt prophylaxis 40mg  daily             -antiplatelet therapy: Aspirin 325 mg daily 3. Pain Management: Hydrocodone and Robaxin as needed              1/23-added ms contin 15mg  q12 for now d/t significant pain at rest/with activity  1/26: continued significant low back/left buttock pain. I  reviewed her images and saw a left SPR fracture.    -increase robaxin to 750mg  and schedule QID  1/27: norco increased   1/28: ortho recs weight bearing as tolerated for now.   -increase ms contin to 30mg  q12  1/29: appears lethargic today. 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 In pain, but pain is stable 4. Mood: Prozac 40 mg daily  2/1: xanax  0.25mg  bid at 0700  and 1200 to help with mobility associated anxiety             -antipsychotic agents: N/A 5. Neuropsych: This patient is capable of making decisions on her own behalf. 6. Skin/Wound Care: change hip dressing 7. Fluids/Electrolytes/Nutrition: intake poor  1/29 -reiterated the importance of adequate PO intake rightnow 8.  Polycystic kidney disease status post renal transplant 06/02/2012.  Baseline creatinine 1.02-1.29.  Continue Prograf, chronic prednisone as well as Myfortic.  Followed by Dr. Jimmy Footman            1/25: CMP stable except for low Na 131 and elevated Cr 1.21. Will repeat tomorrow to trend.    1/26 Sodium unchanged, BUN/Cr continue to drift up   -encourage fluids, hold lasix   -Na increased to 133  2/1 sodium down to 127   -dry by labs. Hold lasix  2/2 sodium holding at 127 today   -she is still -2600cc for admit, weight down from 1/31    -will not resume lasix at this time   -check bmet serially  2/3: Na 129 9.  Acute blood loss anemia.  Transfused 2 units packed red blood cells 12/11/2019.               Hgb holding at 9.8 on 1.22, 9.2 on 1/25  2/1: hgb 8.9   10.  Asthma.  Continue inhalers as directed. 11.  History of gout.  Allopurinol daily.  Monitor for any gout flareups 12.  Chronic diastolic congestive heart failure.  Lasix 40 mg daily. Monitor for sign/symptoms of fluid overload.     1/26-?trend, unsure about weights---need more together to assess for trend, she was -20cc for today by I/O's yesterday  -holding lasix at least for tomorrow d/t #8  1/29, 1/30 weight stable  1/31: increased weight, does appear to have increased work of breathing. Received Lasix 40 this morning. Will add additional 20mg  now.   2/2: weight down today  2/3: weight stable Filed Weights   12/25/19 0535 12/26/19 0500 12/27/19 0535  Weight: 87.1 kg 86.6 kg 86.7 kg    13. Constipation: sorbitol, SSE   if needed  -scheduled senna-s  -large BM 1/30  2/3: constipated: scheduled Miralax.   LOS: 12 days A FACE TO FACE EVALUATION WAS PERFORMED  Charlsie Fleeger P Margaux Engen 12/27/2019, 11:04 AM

## 2019-12-27 NOTE — Plan of Care (Signed)
  Problem: Consults Goal: RH GENERAL PATIENT EDUCATION Description: See Patient Education module for education specifics. Outcome: Progressing   Problem: RH BOWEL ELIMINATION Goal: RH STG MANAGE BOWEL WITH ASSISTANCE Description: STG Manage Bowel with Mod Assistance. Outcome: Progressing Flowsheets (Taken 12/27/2019 1518) STG: Pt will manage bowels with assistance: 3-Moderate assistance   Problem: RH BLADDER ELIMINATION Goal: RH STG MANAGE BLADDER WITH ASSISTANCE Description: STG Manage Bladder With Mod Assistance Outcome: Progressing Flowsheets (Taken 12/27/2019 1518) STG: Pt will manage bladder with assistance: 3-Moderate assistance   Problem: RH SKIN INTEGRITY Goal: RH STG SKIN FREE OF INFECTION/BREAKDOWN Description: No new breakdown with min assist  Outcome: Progressing Goal: RH STG ABLE TO PERFORM INCISION/WOUND CARE W/ASSISTANCE Description: STG Able To Perform Incision/Wound Care With Mod Assistance. Outcome: Progressing Flowsheets (Taken 12/27/2019 1518) STG: Pt will be able to perform incision/wound care with assistance: 4-Minimal assistance   Problem: RH SAFETY Goal: RH STG ADHERE TO SAFETY PRECAUTIONS W/ASSISTANCE/DEVICE Description: STG Adhere to Safety Precautions With Mod Assistance/Device. Outcome: Progressing Flowsheets (Taken 12/27/2019 1518) STG:Pt will adhere to safety precautions with assistance/device: 2-Maximum assistance   Problem: RH PAIN MANAGEMENT Goal: RH STG PAIN MANAGED AT OR BELOW PT'S PAIN GOAL Description: < 4 out of 10.  Outcome: Progressing

## 2019-12-27 NOTE — Progress Notes (Signed)
Occupational Therapy Session Note  Patient Details  Name: April [name] MRN: [id number] Date of Birth: November 27, 1955  Today's Date: 12/27/2019 OT Individual Time: 1355-1425 OT Individual Time Calculation (min): 30 min    Short Term Goals: Week 2:  OT Short Term Goal 1 (Week 2): Pt will transfer to [facility] wiht MAX A of 1 and LRAD OT Short Term Goal 2 (Week 2): Pt will tolerate sitting EOB/getting OOB at least 1x per day OT Short Term Goal 3 (Week 2): Pt will thread 1LE into pants wiht AE PRN OT Short Term Goal 4 (Week 2): Pt will demo alternative pain management strategies with min VC to improve participation in mobility/ADLs  Skilled Therapeutic Interventions/Progress Updates:    Pt received supine, bed totally flat with eyes closed. Pt easily aroused and reporting she cannot do anything today 2/2 pain. Discussed pain, anxiety, and impact of laying flat/supine on OOB tolerance and strength. Pt agreeable to brush teeth at bed level. Pt tolerated HOB being raised to 10 degrees initially before yelling for OT to stop. Pt tolerated another 10 degrees before yelling to stop. Pt was able to be distracted from pain at this level and engaged in conversation with therapist. Discussed yesterday's conversation re importance of pressure relief from buttocks and need for pt to attempt some form of side-lying. Pt initiated rolling, bringing L arm across body before yelling at OT to "stop stop stop". OT informed and showed pt that she was initiating movement all on her own and OT was standing back from the bed- not touching pt at all. Attempted to discuss relationship of anxiety to pain again, but pt flattened head of bed and stated she could no tolerate any more movement. Pt requested to use female urinal at bed level. Pt attempted to void for several minutes with no success and urinal was removed. Pt left supine with HOB at 10 degrees.   Therapy Documentation Precautions:  Precautions Precautions:  Fall Required Braces or Orthoses: Sling Restrictions Weight Bearing Restrictions: Yes LUE Weight Bearing: Weight bear through elbow only LLE Weight Bearing: Weight bearing as tolerated Other Position/Activity Restrictions: no L hip active abduction   Therapy/Group: Individual Therapy  Curtis Sites 12/27/2019, 6:48 AM

## 2019-12-27 NOTE — Progress Notes (Signed)
Physical Therapy Session Note  Patient Details  Name: April [name] MRN: [id number] Date of Birth: [birth year]  Today's Date: 12/27/2019 PT Individual Time: 0915-1010 PT Individual Time Calculation (min): 55 min   Short Term Goals: Week 2:  PT Short Term Goal 1 (Week 2): =LTGs due to ELOS  Skilled Therapeutic Interventions/Progress Updates:   Pt received in supine, agreeable to therapy and pain 4/10 on FACES scale. Therapist attempting not to discuss pain unless brought up by patient 2/2 fear of movement and anticipation of pain w/ movement. Pt agreeable to attempt OOB. Supine>sit w/ mod assist. Pain 8/10 on FACES scale in L hip and pelvis once seated EOB. Supervision to scoot forward and place feet on floor, pt noted to have no increase in pain w/ scooting.    Min assist sit>stand to RW. Pt able to reach full upright posture and take a few pivot steps towards recliner, no noticeable increase in L hip/pelvis pain once in WB. Pt then stated she was falling, however pt remaining upright w/o increase in assistance from therapist. Upon stating that she was falling, pt unable to straighten legs and slumped over RW, however pt still holding herself up w/ only min assist from therapist. Pt ultimately needed max assist to safely reach recliner. Mod assist to scoot back in recliner, no evidence of pain w/ this task.   Once in recliner, pain decreases w/ feet elevated. Pt performing pursed-lip breathing well w/o cues. Discussed transfer w/ pt reports being very fearful of falling. Therapist provided encouragement and reassured her of her ability to safely transfer as evidenced by fluid and safe min assist transfer yesterday. Discussed w/ pt that her anxiety w/ mobility and fear of falling appear to make her pain worse at times. Pt stated "you all aren't listening to me, something is wrong". Reminded pt that pelvic CT scans and x-rays have all showed no change in her fracture status and no identifiable cause  for her sudden increase in pain approximately 1 week ago, per chart review and per conversations w/ medical team. Pt continued to state she felt something was wrong.   Pt requesting to return to bed after ~20 min of upright sitting. Made 3-4 attempts at standing, pt unable to reach full upright despite max assist, she stated she her legs were not working and that she was falling. Pt w/ increased fatigue and agreeable to maximove back to supine for energy conservation. Performed maximove back to supine, pt noted to be incontinent of small amount of bowel, min assist to roll and total assist for pericare. Ended session in supine, all needs in reach. Pt reports pain is better than when up in chair, pt also noticeably more relaxed once in supine. Cold wash cloth applied to forehead per pt request.   Therapy Documentation Precautions:  Precautions Precautions: Fall Required Braces or Orthoses: Sling Restrictions Weight Bearing Restrictions: Yes LUE Weight Bearing: Weight bear through elbow only LLE Weight Bearing: Weight bearing as tolerated Other Position/Activity Restrictions: no L hip active abduction Vital Signs: Therapy Vitals Pulse Rate: 92 Resp: 18 Patient Position (if appropriate): Lying Oxygen Therapy SpO2: 95 % O2 Device: Nasal Cannula O2 Flow Rate (L/min): 1 L/min FiO2 (%): 24 %  Therapy/Group: Individual Therapy  April [name] 12/27/2019, 10:26 AM

## 2019-12-27 NOTE — Progress Notes (Signed)
Occupational Therapy Session Note  Patient Details  Name: April [name] MRN: [id number] Date of Birth: Sep 13, 1956  Today's Date: 12/27/2019 OT Individual Time: 1045-1130 OT Individual Time Calculation (min): 45 min    Short Term Goals: Week 1:  OT Short Term Goal 1 (Week 1): Pt will sit to stand wiht MOD A of 1 in prep for CM OT Short Term Goal 1 - Progress (Week 1): Met OT Short Term Goal 2 (Week 1): Pt will transfer to w/c or BSC with MAX A of 1 to decrease BOC OT Short Term Goal 2 - Progress (Week 1): Not met OT Short Term Goal 3 (Week 1): Pt will thread 1LE into pants with AE PRN OT Short Term Goal 3 - Progress (Week 1): Not met OT Short Term Goal 4 (Week 1): Pt will groom with set up OT Short Term Goal 4 - Progress (Week 1): Met  Skilled Therapeutic Interventions/Progress Updates:    1:1. Pt received in bed reporting PT session earlier was "bad." pt reporting significant pain in pelvis/hip and declines OOB/EOB tx. Continued education on anxiety, anticipation, and expectation of pain impacting participation in tx. Attempted pt centered goal setting for daily tx, however pt frequently closing eyes stating medicaitons are making her tired. OT wrote goals up on wall of sitting EOB every session, sitting in recliner 1x per day, and elevating HOB >/= 30* for 30 min after session to improve tolerance to sitting upright and improving pain tolerance. Discussed goals with pt for motivation, however pt often closing eyes and just stating, "I cant do it, it hurts too much." Exited session with pt seated in bed, exit alarm on and call light inr each  Therapy Documentation Precautions:  Precautions Precautions: Fall Required Braces or Orthoses: Sling Restrictions Weight Bearing Restrictions: Yes LUE Weight Bearing: Weight bear through elbow only LLE Weight Bearing: Weight bearing as tolerated Other Position/Activity Restrictions: no L hip active abduction General:   Vital Signs: Therapy  Vitals Pulse Rate: 92 Resp: 18 Patient Position (if appropriate): Lying Oxygen Therapy SpO2: 95 % O2 Device: Nasal Cannula O2 Flow Rate (L/min): 1 L/min FiO2 (%): 24 % Pain: Pain Assessment Pain Scale: 0-10 Pain Score: 0-No pain ADL: ADL Grooming: Minimal assistance Where Assessed-Grooming: Edge of bed Upper Body Bathing: Minimal assistance Where Assessed-Upper Body Bathing: Edge of bed Lower Body Bathing: Maximal assistance Where Assessed-Lower Body Bathing: Edge of bed Upper Body Dressing: Supervision/safety Where Assessed-Upper Body Dressing: Edge of bed Lower Body Dressing: (2 helper sit to stand) Where Assessed-Lower Body Dressing: Edge of bed Vision   Perception    Praxis   Exercises:   Other Treatments:     Therapy/Group: Individual Therapy  Tonny Branch 12/27/2019, 11:13 AM

## 2019-12-28 ENCOUNTER — Inpatient Hospital Stay (HOSPITAL_COMMUNITY): Payer: Medicare Other | Admitting: Physical Therapy

## 2019-12-28 ENCOUNTER — Inpatient Hospital Stay (HOSPITAL_COMMUNITY): Payer: Medicare Other

## 2019-12-28 NOTE — Progress Notes (Signed)
[place] PHYSICAL MEDICINE & REHABILITATION PROGRESS NOTE   Subjective/Complaints: Pt lying in bed. Needed to have bm urgently. Daughter came in a bit later. Patient still having pain but mentation still slow  ROS: Patient denies fever, rash, sore throat, blurred vision, nausea, vomiting, diarrhea, cough, shortness of breath or chest pain,   Headache    Objective: No results found. No results for input(s): WBC, HGB, HCT, PLT in the last 72 hours. Recent Labs    12/26/19 0532 12/27/19 0657  NA 127* 129*  K 4.2 4.2  CL 93* 90*  CO2 25 26  GLUCOSE 95 89  BUN 54* 50*  CREATININE 1.53* 1.30*  CALCIUM 8.8* 9.1    Intake/Output Summary (Last 24 hours) at 12/28/2019 1022 Last data filed at 12/28/2019 0730 Gross per 24 hour  Intake 150 ml  Output 2600 ml  Net -2450 ml     Physical Exam: Vital Signs Blood pressure 135/61, pulse 88, temperature (!) 97.5 F (36.4 C), resp. rate 18, height 5\' 1"  (1.549 m), weight 85.7 kg, SpO2 97 %. Constitutional: No distress . Vital signs reviewed. HEENT: EOMI, oral membranes moist Neck: supple Cardiovascular: RRR without murmur. No JVD    Respiratory: CTA Bilaterally without wheezes or rales. Normal effort    GI: BS +, non-tender, non-distended  Musculoskeletal:     Comments: ongoing pelvic and LB pain. Edema improved Neurological: awake but delayed processing. Oriented to month and year only.  Motor: RUE: 4-/5 proximal to distal LLE: 1-4/5 limited proximally by pain LUE: proximally limited by splint, hand grip 2/5 limited by splint/swelling LLE: 2+/5 proximal to distal Sensation diminished to light touch b/le feet --no changes Skin:  Left hip cdi with staples Left upper extremity with sugar tong splint in place. Scattered ecchymoses as above on all 4 limbs, especially in UE's.--improving Psychiatric: flat, delayed  Assessment/Plan: 1. Functional deficits secondary to polytrauma which require 3+ hours per day of interdisciplinary  therapy in a comprehensive inpatient rehab setting.  Physiatrist is providing close team supervision and 24 hour management of active medical problems listed below.  Physiatrist and rehab team continue to assess barriers to discharge/monitor patient progress toward functional and medical goals  Care Tool:  Bathing    Body parts bathed by patient: Left arm, Chest, Abdomen, Front perineal area, Right upper leg, Left upper leg, Face, Right arm   Body parts bathed by helper: Buttocks, Right lower leg, Left lower leg Body parts n/a: Right lower leg, Left lower leg   Bathing assist Assist Level: Moderate Assistance - Patient 50 - 74%     Upper Body Dressing/Undressing Upper body dressing   What is the patient wearing?: Pull over shirt    Upper body assist Assist Level: Moderate Assistance - Patient 50 - 74%    Lower Body Dressing/Undressing Lower body dressing      What is the patient wearing?: Incontinence brief     Lower body assist Assist for lower body dressing: Maximal Assistance - Patient 25 - 49%     Toileting Toileting Toileting Activity did not occur (Clothing management and hygiene only): N/A (no void or bm)  Toileting assist Assist for toileting: Total Assistance - Patient < 25%     Transfers Chair/bed transfer  Transfers assist  Chair/bed transfer activity did not occur: Safety/medical concerns  Chair/bed transfer assist level: Maximal Assistance - Patient 25 - 49%     Locomotion Ambulation   Ambulation assist   Ambulation activity did not occur: Safety/medical concerns  Walk 10 feet activity   Assist  Walk 10 feet activity did not occur: Safety/medical concerns        Walk 50 feet activity   Assist Walk 50 feet with 2 turns activity did not occur: Safety/medical concerns         Walk 150 feet activity   Assist Walk 150 feet activity did not occur: Safety/medical concerns         Walk 10 feet on uneven surface   activity   Assist Walk 10 feet on uneven surfaces activity did not occur: Safety/medical concerns         Wheelchair     Assist     Wheelchair activity did not occur: Safety/medical concerns         Wheelchair 50 feet with 2 turns activity    Assist    Wheelchair 50 feet with 2 turns activity did not occur: Safety/medical concerns       Wheelchair 150 feet activity     Assist  Wheelchair 150 feet activity did not occur: Safety/medical concerns       Blood pressure 135/61, pulse 88, temperature (!) 97.5 F (36.4 C), resp. rate 18, height 5\' 1"  (1.549 m), weight 85.7 kg, SpO2 97 %.  Medical Problem List and Plan: 1.  Decreased functional mobility secondary to left displaced femoral neck fracture status post left total hip replacement anterior approach as well as severely left comminuted distal radius fracture status post ORIF 12/08/2019 as well as forearm laceration repair.  Weightbearing as tolerated through left elbow only and weightbearing as tolerated left lower extremity.  No left hip abduction. Most recent CT from 1/27 revealed fx's of left SPR/IPR and left sacrum.              -patient may  shower if left arm and hip wound covered            -remains limited by pain and mentation            -Continue CIR therapies including PT, OT  2.  Antithrombotics: -DVT/anticoagulation: SCDs.  Dopplers negative   lovenox for dvt prophylaxis 40mg  daily             -antiplatelet therapy: Aspirin 325 mg daily 3. Pain Management: Hydrocodone and Robaxin as needed              1/23-added ms contin 15mg  q12 for now d/t significant pain at rest/with activity  1/26: continued significant low back/left buttock pain. I reviewed her images and saw a left SPR fracture.    -increase robaxin to 750mg  and schedule QID  1/27: norco increased   1/28: ortho recs weight bearing as tolerated for now.   -increase ms contin to 30mg  q12  1/29: appears lethargic today. Will change  robaxin to 500mg  q6 PRN   -continue same MS contin for now   -encouraged use of heat, ice to area (she already has kpad)  2/2: more lethargic, likely d/t meds   -reduce ms contin to 15mg  q12 and hydrocodone to 5/325 q4 prn  2/3: Less lethargic. In pain, but pain is stable  2/4: less lethargic but far from baseline, daughter agrees   -dc ms contin   -continue vicodin PRN 4. Mood: Prozac 40 mg daily  2/1: xanax 0.25mg  bid at 0700  and 1200 to help with mobility associated anxiety             -antipsychotic agents: N/A 5. Neuropsych: This patient is capable of making  decisions on her own behalf. 6. Skin/Wound Care: dc staples from hip today 7. Fluids/Electrolytes/Nutrition: intake poor  1/29 -reiterated the importance of adequate PO intake rightnow 8.  Polycystic kidney disease status post renal transplant 06/02/2012.  Baseline creatinine 1.02-1.29.  Continue Prograf, chronic prednisone as well as Myfortic.  Followed by Dr. Jimmy Footman            1/25: CMP stable except for low Na 131 and elevated Cr 1.21. Will repeat tomorrow to trend.    1/26 Sodium unchanged, BUN/Cr continue to drift up   -encourage fluids, hold lasix   -Na increased to 133  2/1 sodium down to 127   -dry by labs. Hold lasix  2/2 sodium holding at 127 today   -she is still -2600cc for admit, weight down from 1/31    -will not resume lasix at this time   -check bmet serially  2/3: Na 129  2/4: no bmet today---recheck tomorrow 9.  Acute blood loss anemia.  Transfused 2 units packed red blood cells 12/11/2019.               Hgb holding at 9.8 on 1.22, 9.2 on 1/25  2/1: hgb 8.9   10.  Asthma.  Continue inhalers as directed. 11.  History of gout.  Allopurinol daily.  Monitor for any gout flareups 12.  Chronic diastolic congestive heart failure.  Lasix 40 mg daily. Monitor for sign/symptoms of fluid overload.    1/26-?trend, unsure about weights---need more together to assess for trend, she was -20cc for today by I/O's  yesterday  -holding lasix at least for tomorrow d/t #8  1/29, 1/30 weight stable  1/31: increased weight, does appear to have increased work of breathing. Received Lasix 40 this morning. Will add additional 20mg  now.   2/2: weight down today  2/4: weight stable to decreased Filed Weights   12/26/19 0500 12/27/19 0535 12/28/19 0500  Weight: 86.6 kg 86.7 kg 85.7 kg    13. Constipation: sorbitol, SSE   if needed  -scheduled senna-s  -large BM 1/30  2/3: constipated: scheduled Miralax.   2/4 large bm this morning LOS: 13 days [place] 12/28/2019, 10:22 AM

## 2019-12-28 NOTE — Progress Notes (Signed)
Physical Therapy Session Note  Patient Details  Name: April [name] MRN: [id number] Date of Birth: 03-May-1956  Today's Date: 12/28/2019 PT Individual Time: 1100-1130 AND 1355-1424 PT Individual Time Calculation (min): 30 min AND 29 min  Short Term Goals: Week 2:  PT Short Term Goal 1 (Week 2): =LTGs due to ELOS  Skilled Therapeutic Interventions/Progress Updates:   Session 1:  Pt received in recliner, slightly reclined and daughter reported she had sat in nearly full upright for about an hour, pt 4/10 on FACES pain scale (had just been medicated). Pt requesting to return to bed. Sit>stand to [place] x2 reps before she was able to achieve full upright, however needed max encouragement to reach and maintain full upright. Daughter present providing assist w/ transfer. Pt able to take 2-3 small pivot steps towards bed, daughter managing PFRW and therapist providing support at hips and tactile/verbal cues for step-by-step technique, pain 8/10 on FACES scale and pt stated she was falling, unable to take any further steps and BLEs slowly becoming more flexed. Max encouragement to attempt to stand up straight but pt unable, needed max-total assist to bring hips safely onto EOB. Mod assist sit>supine. Pt and daughter both in agreement, that transfer was worse than earlier transfer. Discussed w/ pt and daughter options of transferring w/ less pain medicine but safer and more fluid transfer OR transferring w/ more pain medicine but less safe transfer. Pt unable to comprehend what therapist was asking 2/2 lethargy. Discussed w/ daughter that pt is making minimal progress while on CIR and that she would likely benefit from a lower level of therapies - HH or SNF 2/2 inability to tolerate an agressive rehab. Discussed that d/c home would require 24/7 hands-on care and likely a hospital bed. Daughter is an OT and is very aware of the type of care pt would require and will discuss these options with her family. Pt  ended session in supine, all needs in reach.   Session 2:  Pt in supine, pain 2/10 on FACES (pt premedicated). Sat up bed to 30 deg while pt took some bites of lunch, reinforced need to sit upright to eat meals, daughter also providing encouragement. Min cues to use RUE to self-feed. After a few bites, pt agreeable to sit EOB w/ encouragement from daughter and therapist. Supine>sit w/ mod assist and verbal/tactile cues for technique. Maintained static sitting for 1-2 min w/ supervision, 6/10 on FACES scale. Pt tolerating EOB relatively well compared to other sessions w/ this therapist. Pt requesting to lay down 2/2 pressure on L hip, encouraged to stand in order to relieve pressure, daughter providing encouragement as well. Sit>stand w/ min assist +2. Therapist assisting to boost hips up into standing and daughter assisting w/ RUE placement and then transition to [place]. Encouraged pt to bear majority of weight on RLE to minimize pain in LLE, pt states she was unable to do so, 10/10 pain on FACES scale. Pt was able to reach full upright in standing for 10-15 sec. Returned to seated and mod assist sit>supine. Total assist to boost up in bed. Encouraged pt in her progress today and to recognize that she does have strength and she can push through the pain, daughter reinforcing progress. Ended session in supine, all needs in reach. Pt declining further activity 2/2 pain, missed 36 min of skilled PT 2/2 pain.   Therapy Documentation Precautions:  Precautions Precautions: Fall Required Braces or Orthoses: Sling Restrictions Weight Bearing Restrictions: Yes LUE Weight Bearing: Weight bear  through elbow only LLE Weight Bearing: Weight bearing as tolerated Other Position/Activity Restrictions: no L hip active abduction  Therapy/Group: Individual Therapy  Takoya Jonas Clent Demark 12/28/2019, 11:53 AM

## 2019-12-28 NOTE — Progress Notes (Signed)
Occupational Therapy Session Note  Patient Details  Name: April [name] MRN: [id number] Date of Birth: 1956-08-28  Today's Date: 12/28/2019 OT Individual Time: 0900-1000 OT Individual Time Calculation (min): 60 min    Short Term Goals: Week 1:  OT Short Term Goal 1 (Week 1): Pt will sit to stand wiht MOD A of 1 in prep for CM OT Short Term Goal 1 - Progress (Week 1): Met OT Short Term Goal 2 (Week 1): Pt will transfer to w/c or BSC with MAX A of 1 to decrease BOC OT Short Term Goal 2 - Progress (Week 1): Not met OT Short Term Goal 3 (Week 1): Pt will thread 1LE into pants with AE PRN OT Short Term Goal 3 - Progress (Week 1): Not met OT Short Term Goal 4 (Week 1): Pt will groom with set up OT Short Term Goal 4 - Progress (Week 1): Met  Skilled Therapeutic Interventions/Progress Updates:    1:1. Pt received in bed reporting "I just pooped myself." EDU re getting onto bed pan or BSC for continence and to decrease risk of UTI as pt [place] sitting in bowel. Pt verbalized understanding. Pt rolls with MIN A to B sides while OT completes dependent peri care/changes brief. Pt daughter arrives and encourages pt to sit up on EOB. Supine<>sitting 2x with MAX A for trunk elevation with calming music playing in background and max VC for breathing technqiues. Pt bathes UB with demo of strategy to was R armpit. Pt dons shirt with MIN A to pull down back. Pt requires rest break in supine for pain relief. Stand picot transfer with PFRW with mod A for power up and min A to pivot to recliner. Daughter encourages pt to sit up in recliner in betwee sessions and pt agrees. Exited session with pt seated in recliner, call light in reach and all needs met  Therapy Documentation Precautions:  Precautions Precautions: Fall Required Braces or Orthoses: Sling Restrictions Weight Bearing Restrictions: Yes LUE Weight Bearing: Weight bear through elbow only LLE Weight Bearing: Weight bearing as tolerated Other  Position/Activity Restrictions: no L hip active abduction General:   Vital Signs: Therapy Vitals Temp: (!) 97.5 F (36.4 C) Pulse Rate: 88 Resp: 18 BP: 135/61 Patient Position (if appropriate): Lying Oxygen Therapy SpO2: 97 % O2 Device: Nasal Cannula O2 Flow Rate (L/min): 1 L/min FiO2 (%): 24 % Pain:   ADL: ADL Grooming: Minimal assistance Where Assessed-Grooming: Edge of bed Upper Body Bathing: Minimal assistance Where Assessed-Upper Body Bathing: Edge of bed Lower Body Bathing: Maximal assistance Where Assessed-Lower Body Bathing: Edge of bed Upper Body Dressing: Supervision/safety Where Assessed-Upper Body Dressing: Edge of bed Lower Body Dressing: (2 helper sit to stand) Where Assessed-Lower Body Dressing: Edge of bed Vision   Perception    Praxis   Exercises:   Other Treatments:     Therapy/Group: Individual Therapy  Tonny Branch 12/28/2019, 9:59 AM

## 2019-12-29 ENCOUNTER — Inpatient Hospital Stay (HOSPITAL_COMMUNITY): Payer: Medicare Other | Admitting: Physical Therapy

## 2019-12-29 ENCOUNTER — Inpatient Hospital Stay (HOSPITAL_COMMUNITY): Payer: Medicare Other

## 2019-12-29 LAB — BASIC METABOLIC PANEL
Anion gap: 8 (ref 5–15)
BUN: 45 mg/dL — ABNORMAL HIGH (ref 8–23)
CO2: 28 mmol/L (ref 22–32)
Calcium: 9.1 mg/dL (ref 8.9–10.3)
Chloride: 96 mmol/L — ABNORMAL LOW (ref 98–111)
Creatinine, Ser: 1.29 mg/dL — ABNORMAL HIGH (ref 0.44–1.00)
GFR calc Af Amer: 51 mL/min — ABNORMAL LOW (ref >60)
GFR calc non Af Amer: 44 mL/min — ABNORMAL LOW (ref >60)
Glucose, Bld: 92 mg/dL (ref 70–99)
Potassium: 4.9 mmol/L (ref 3.5–5.1)
Sodium: 132 mmol/L — ABNORMAL LOW (ref 135–145)

## 2019-12-29 NOTE — Progress Notes (Signed)
Physical Therapy Weekly Progress Note  Patient Details  Name: April [name] MRN: [id number] Date of Birth: 06/05/1956  Beginning of progress report period: December 22, 2019 End of progress report period: December 29, 2019  Today's Date: 12/29/2019 PT Individual Time: 1100-1115 PT Individual Time Calculation (min): 15 min   Patient has met 0 of 7 long term goals and has not made progress towards LTGs over last week. She continues to decline in function 2/2 pain that appears exacerbated by anxiety and fear of falling w/ movement. She demonstrates behaviors consistent w/ pain catastrophizing including increased pain in anticipation of pain with movement, difficulty shifting thoughts away from pain, and helplessness in regards to controlling and managing pain. Would recommend transitioning to a decreased level of therapies to allow for better tolerance to activity and a slower progression in functional level. She can be min assist for stand pivot transfers w/ PFRW, however is more consistently max-total assist 2/2 fear of falling and pain. Bed mobility remains mod-max assist. She can additionally tolerate sitting in an upright position for only 1-2 min at a time.   Patient continues to demonstrate the following deficits muscle weakness and muscle joint tightness, decreased cardiorespiratoy endurance and decreased standing balance, decreased balance strategies and difficulty maintaining precautions and therefore will continue to benefit from skilled PT intervention to increase functional independence with mobility.  Patient not progressing toward long term goals.  See goal revision..  Plan of care revisions: pt made QD 2/2 inability to tolerate activity . LTGs downgraded to min assist overall.  PT Short Term Goals Week 2:  PT Short Term Goal 1 (Week 2): =LTGs due to ELOS Week 3:  PT Short Term Goal 1 (Week 3): =LTGs due to ELOS  Skilled Therapeutic Interventions/Progress Updates:   Pt received in  supine, pain 0/10 on FACES scale and agreeable to attempt therapy session. Pt needing to void, encouraged by therapist to sit up at EOB to attempt voiding in urinal. Upon discussion of this, pt w/ increased LLE spasms, declining attempts at getting to EOB. Pt then stated she was actively having a BM. Max assist to roll for bedpan placement. Ended session on bedpan - made RN aware and call bell within reach. Pt missed 45 min of skilled PT 2/2 toileting and pain.   Therapy Documentation Precautions:  Precautions Precautions: Fall Required Braces or Orthoses: Sling Restrictions Weight Bearing Restrictions: Yes LUE Weight Bearing: Weight bear through elbow only LLE Weight Bearing: Weight bearing as tolerated Other Position/Activity Restrictions: no L hip active abduction  Therapy/Group: Individual Therapy  Amy K Tally 12/29/2019, 12:21 PM  

## 2019-12-29 NOTE — Progress Notes (Signed)
Occupational Therapy Session Note  Patient Details  Name: April [name] MRN: [id number] Date of Birth: 1955/12/03  Today's Date: 12/29/2019 OT Individual Time: 9485-4627 OT Individual Time Calculation (min): 45 min  and Today's Date: 12/29/2019 OT Missed Time: 15 Minutes Missed Time Reason: Pain;Patient unwilling/refused to participate without medical reason   Short Term Goals: Week 1:  OT Short Term Goal 1 (Week 1): Pt will sit to stand wiht MOD A of 1 in prep for CM OT Short Term Goal 1 - Progress (Week 1): Met OT Short Term Goal 2 (Week 1): Pt will transfer to w/c or BSC with MAX A of 1 to decrease BOC OT Short Term Goal 2 - Progress (Week 1): Not met OT Short Term Goal 3 (Week 1): Pt will thread 1LE into pants with AE PRN OT Short Term Goal 3 - Progress (Week 1): Not met OT Short Term Goal 4 (Week 1): Pt will groom with set up OT Short Term Goal 4 - Progress (Week 1): Met  Skilled Therapeutic Interventions/Progress Updates:    1;1. Pt received in bed with 2/10 pain in hip laying flat. Pt reporting need to toilet and agreeable to getting up to [facility]. Pt completes Supine>sitting with MAX A and VC for sequencing. Pt completes stand pivot transfer with MOD A for power up and MIN A for steadying to pivot to toilet. VC provided for pivoting walker. Pt requires total A for components of toileting d/t pain and decreased standing tolerance. Pt able to have BM in [facility]! Pt stand pivot back to bed however prematurely sits requiring guiding A to EOB for safety. Total A sit to supine. Supine rest break for pain relief. Pt initially agreeable to getting back up and into w/c, however once EOB, pt states, "I cant do this I need to lay back down. Pt able to manage LEs in to bed with CGA and multimodal cuing for sequencing. Discussed with pt current tolerance to therapy and potential for changing POC, and SNF placement v Home with 24/7 A. Pt states husband has to go back to work soon and likely wont be able to  provide A. Exited session with pt missing 15 min work as pt refuses to do any EOB/OOB tx. Exited session with pt seated in bed, exit alarm on and call light in reach  Therapy Documentation Precautions:  Precautions Precautions: Fall Required Braces or Orthoses: Sling Restrictions Weight Bearing Restrictions: Yes LUE Weight Bearing: Weight bear through elbow only LLE Weight Bearing: Weight bearing as tolerated Other Position/Activity Restrictions: no L hip active abduction General:   Vital Signs:  Pain: Pain Assessment Pain Scale: 0-10 Pain Score: 2  Pain Type: Acute pain;Surgical pain Pain Location: Arm Pain Orientation: Left Pain Descriptors / Indicators: Aching Pain Frequency: Intermittent Pain Onset: On-going Patients Stated Pain Goal: 1 Pain Intervention(s): Medication (See eMAR) ADL: ADL Grooming: Minimal assistance Where Assessed-Grooming: Edge of bed Upper Body Bathing: Minimal assistance Where Assessed-Upper Body Bathing: Edge of bed Lower Body Bathing: Maximal assistance Where Assessed-Lower Body Bathing: Edge of bed Upper Body Dressing: Supervision/safety Where Assessed-Upper Body Dressing: Edge of bed Lower Body Dressing: (2 helper sit to stand) Where Assessed-Lower Body Dressing: Edge of bed Vision   Perception    Praxis   Exercises:   Other Treatments:     Therapy/Group: Individual Therapy  Tonny Branch 12/29/2019, 9:22 AM

## 2019-12-29 NOTE — Progress Notes (Signed)
Social Work Patient ID: April [name], female   DOB: 09-19-1956, 64 y.o.   MRN: [id number]    SW met with pt and pt husband in room to perform wellness check-in after continued family education this week. Discussion with regard on best decision to home or short term rehab SNF was discussed. Pt husband recognizes his physical limitations, and intends to return to work the first week of March. Reports that their son is home (on-line college) but leaves most days to use internet services at [employer]. Pt is hesitant about SNF placement due to COVID. Discussion on pros/cons of SNF short term rehab vs home were discussed. Pt will discuss further with family. SW will follow-up.  Loralee Pacas, MSW, Social Worker Office (preferred): [phone] Cell: [phone] Fax: [phone]

## 2019-12-29 NOTE — Progress Notes (Addendum)
[place] PHYSICAL MEDICINE & REHABILITATION PROGRESS NOTE   Subjective/Complaints: Lying in bed again. Still has pelvic pain. Was able to sleep last night. Feels a little more alert today  ROS: Patient denies fever, rash, sore throat, blurred vision, nausea, vomiting, diarrhea, cough, shortness of breath or chest pain,   Headache .    Objective: No results found. No results for input(s): WBC, HGB, HCT, PLT in the last 72 hours. Recent Labs    12/27/19 0657 12/29/19 0745  NA 129* 132*  K 4.2 4.9  CL 90* 96*  CO2 26 28  GLUCOSE 89 92  BUN 50* 45*  CREATININE 1.30* 1.29*  CALCIUM 9.1 9.1    Intake/Output Summary (Last 24 hours) at 12/29/2019 1024 Last data filed at 12/29/2019 0700 Gross per 24 hour  Intake 240 ml  Output 875 ml  Net -635 ml     Physical Exam: Vital Signs Blood pressure (!) 122/55, pulse 84, temperature 98 F (36.7 C), resp. rate 18, height 5\' 1"  (1.549 m), weight 85.7 kg, SpO2 99 %. Constitutional: No distress . Vital signs reviewed. HEENT: EOMI, oral membranes moist Neck: supple Cardiovascular: RRR without murmur. No JVD    Respiratory: CTA Bilaterally without wheezes or rales. Normal effort    GI: BS +, non-tender, non-distended  Musculoskeletal:     Comments: ongoing pelvic and LB pain. Edema improved Neurological: oriented to person, month, day, reason she's here. Still delayed but more alert and a little quicker with processing Motor: RUE: 4-/5 proximal to distal LLE: 1-4/5 limited proximally by pain LUE: proximally limited by splint, hand grip 2/5 limited by splint/swelling LLE: 2+/5 proximal to distal Sensation diminished to light touch b/le feet --stable Skin:  Left hip cdi with staples Left upper extremity with sugar tong splint in place. Scattered ecchymoses as above on all 4 limbs, especially in UE's.--improving Psychiatric: a little more animated today  Assessment/Plan: 1. Functional deficits secondary to polytrauma which require 3+  hours per day of interdisciplinary therapy in a comprehensive inpatient rehab setting.  Physiatrist is providing close team supervision and 24 hour management of active medical problems listed below.  Physiatrist and rehab team continue to assess barriers to discharge/monitor patient progress toward functional and medical goals  Care Tool:  Bathing    Body parts bathed by patient: Left arm, Chest, Abdomen, Front perineal area, Right upper leg, Left upper leg, Face, Right arm   Body parts bathed by helper: Buttocks, Right lower leg, Left lower leg Body parts n/a: Right lower leg, Left lower leg   Bathing assist Assist Level: Moderate Assistance - Patient 50 - 74%     Upper Body Dressing/Undressing Upper body dressing   What is the patient wearing?: Pull over shirt    Upper body assist Assist Level: Moderate Assistance - Patient 50 - 74%    Lower Body Dressing/Undressing Lower body dressing      What is the patient wearing?: Incontinence brief     Lower body assist Assist for lower body dressing: Maximal Assistance - Patient 25 - 49%     Toileting Toileting Toileting Activity did not occur (Clothing management and hygiene only): N/A (no void or bm)  Toileting assist Assist for toileting: Total Assistance - Patient < 25%     Transfers Chair/bed transfer  Transfers assist  Chair/bed transfer activity did not occur: Safety/medical concerns  Chair/bed transfer assist level: 2 Helpers     Locomotion Ambulation   Ambulation assist   Ambulation activity did not occur:  Safety/medical concerns          Walk 10 feet activity   Assist  Walk 10 feet activity did not occur: Safety/medical concerns        Walk 50 feet activity   Assist Walk 50 feet with 2 turns activity did not occur: Safety/medical concerns         Walk 150 feet activity   Assist Walk 150 feet activity did not occur: Safety/medical concerns         Walk 10 feet on uneven  surface  activity   Assist Walk 10 feet on uneven surfaces activity did not occur: Safety/medical concerns         Wheelchair     Assist     Wheelchair activity did not occur: Safety/medical concerns         Wheelchair 50 feet with 2 turns activity    Assist    Wheelchair 50 feet with 2 turns activity did not occur: Safety/medical concerns       Wheelchair 150 feet activity     Assist  Wheelchair 150 feet activity did not occur: Safety/medical concerns       Blood pressure (!) 122/55, pulse 84, temperature 98 F (36.7 C), resp. rate 18, height 5\' 1"  (1.549 m), weight 85.7 kg, SpO2 99 %.  Medical Problem List and Plan: 1.  Decreased functional mobility secondary to left displaced femoral neck fracture status post left total hip replacement anterior approach as well as severely left comminuted distal radius fracture status post ORIF 12/08/2019 as well as forearm laceration repair.  Weightbearing as tolerated through left elbow only and weightbearing as tolerated left lower extremity.  No left hip abduction. Most recent CT from 1/27 revealed fx's of left SPR/IPR and left sacrum.              -patient may  shower if left arm and hip wound covered            -remains limited by pain and mentation           2/5 -Continue CIR therapies including PT, OT as tolerated. May have to look at short term SNF d/t ongoing pain/tolerance issues  2.  Antithrombotics: -DVT/anticoagulation: SCDs.  Dopplers negative   lovenox for dvt prophylaxis 40mg  daily             -antiplatelet therapy: Aspirin 325 mg daily 3. Pain Management: continue Hydrocodone and Robaxin as needed  -kpad/ice prn               -MS Contin stopped and hydrocodone decreased d/t lethargy  2/5 more alert today but pain likely will be limiting. Still not at cognitive baseline either. 4. Mood: Prozac 40 mg daily  2/1: xanax 0.25mg  bid at 0700  and 1200 to help with mobility associated anxiety  2/5 stop xanax  d/t persistent delays in processing             -antipsychotic agents: N/A 5. Neuropsych: This patient is capable of making decisions on her own behalf. 6. Skin/Wound Care: dc staples from hip today 7. Fluids/Electrolytes/Nutrition: intake poor  Continue to push po 8.  Polycystic kidney disease status post renal transplant 06/02/2012.  Baseline creatinine 1.02-1.29.  Continue Prograf, chronic prednisone as well as Myfortic.  Followed by Dr. Jimmy Footman            1/25: CMP stable except for low Na 131 and elevated Cr 1.21. Will repeat tomorrow to trend.    1/26 Sodium unchanged,  BUN/Cr continue to drift up   -encourage fluids, hold lasix   -Na increased to 133  2/1 sodium down to 127   -dry by labs. Hold lasix  2/2 sodium holding at 127 today   -she is still -2600cc for admit, weight down from 1/31    -will not resume lasix at this time   -check bmet serially  2/3: Na 129  2/5 Na 132 and BUN/Cr improving off lasix   -continue to encourage fluids   -recheck labs Monday  9.  Acute blood loss anemia.  Transfused 2 units packed red blood cells 12/11/2019.               Hgb holding at 9.8 on 1.22, 9.2 on 1/25  2/1: hgb 8.9   10.  Asthma.  Continue inhalers as directed. 11.  History of gout.  Allopurinol daily.  Monitor for any gout flareups 12.  Chronic diastolic congestive heart failure.  Lasix 40 mg daily. Monitor for sign/symptoms of fluid overload.    1/26-?trend, unsure about weights---need more together to assess for trend, she was -20cc for today by I/O's yesterday  -holding lasix at least for tomorrow d/t #8  1/29, 1/30 weight stable  1/31: increased weight, does appear to have increased work of breathing. Received Lasix 40 this morning. Will add additional 20mg  now.   2/5: weights stable to decreased  2/4: weight stable to decreased Filed Weights   12/27/19 0535 12/28/19 0500 12/29/19 0428  Weight: 86.7 kg 85.7 kg 85.7 kg    13. Constipation: sorbitol, SSE   if needed  -scheduled  senna-s  -large BM 1/30  2/3: constipated: scheduled Miralax.   2/4 large bm this morning LOS: 14 days A FACE TO [place] 12/29/2019, 10:24 AM

## 2019-12-29 NOTE — Plan of Care (Signed)
  Problem: Consults Goal: RH GENERAL PATIENT EDUCATION Description: See Patient Education module for education specifics. Outcome: Progressing   Problem: RH BOWEL ELIMINATION Goal: RH STG MANAGE BOWEL WITH ASSISTANCE Description: STG Manage Bowel with Mod Assistance. Outcome: Progressing   Problem: RH BLADDER ELIMINATION Goal: RH STG MANAGE BLADDER WITH ASSISTANCE Description: STG Manage Bladder With Mod Assistance Outcome: Progressing   Problem: RH SKIN INTEGRITY Goal: RH STG SKIN FREE OF INFECTION/BREAKDOWN Description: No new breakdown with min assist  Outcome: Progressing Goal: RH STG ABLE TO PERFORM INCISION/WOUND CARE W/ASSISTANCE Description: STG Able To Perform Incision/Wound Care With Mod Assistance. Outcome: Progressing   Problem: RH SAFETY Goal: RH STG ADHERE TO SAFETY PRECAUTIONS W/ASSISTANCE/DEVICE Description: STG Adhere to Safety Precautions With Mod Assistance/Device. Outcome: Progressing   Problem: RH PAIN MANAGEMENT Goal: RH STG PAIN MANAGED AT OR BELOW PT'S PAIN GOAL Description: < 4 out of 10.  Outcome: Progressing

## 2019-12-29 NOTE — Plan of Care (Signed)
  Problem: Sit to Stand Goal: LTG:  Patient will perform sit to stand with assistance level (PT) Description: LTG:  Patient will perform sit to stand with assistance level (PT) Flowsheets (Taken 12/29/2019 1242) LTG: PT will perform sit to stand in preparation for functional mobility with assistance level: (goal downgraded 2/5 due to lack of progress - AAT) Minimal Assistance - Patient > 75% Note: goal downgraded 2/5 due to lack of progress - AAT   Problem: RH Bed Mobility Goal: LTG Patient will perform bed mobility with assist (PT) Description: LTG: Patient will perform bed mobility with assistance, with/without cues (PT). Flowsheets (Taken 12/29/2019 1242) LTG: Pt will perform bed mobility with assistance level of: (goal downgraded 2/5 due to lack of progress - AAT) Minimal Assistance - Patient > 75% Note: goal downgraded 2/5 due to lack of progress - AAT   Problem: RH Bed to Chair Transfers Goal: LTG Patient will perform bed/chair transfers w/assist (PT) Description: LTG: Patient will perform bed to chair transfers with assistance (PT). Flowsheets (Taken 12/29/2019 1242) LTG: Pt will perform Bed to Chair Transfers with assistance level: (goal downgraded 2/5 due to lack of progress - AAT) Minimal Assistance - Patient > 75% Note: goal downgraded 2/5 due to lack of progress - AAT   Problem: RH Car Transfers Goal: LTG Patient will perform car transfers with assist (PT) Description: LTG: Patient will perform car transfers with assistance (PT). Outcome: Not Applicable Flowsheets (Taken 12/29/2019 1242) LTG: Pt will perform car transfers with assist:: (goal discontineud 2/5 due to lack of progress - AAT) -- Note: goal discontinued 2/5 due to lack of progress - AAT   Problem: RH Wheelchair Mobility Goal: LTG Patient will propel w/c in controlled environment (PT) Description: LTG: Patient will propel wheelchair in controlled environment, # of feet with assist (PT) Outcome: Not  Applicable Flowsheets (Taken 12/29/2019 1242) LTG: Pt will propel w/c in controlled environ  assist needed:: (goal discontinued 2/5 due to lack of progress - AAT) -- Note: goal discontinued 2/5 due to lack of progress - AAT Goal: LTG Patient will propel w/c in home environment (PT) Description: LTG: Patient will propel wheelchair in home environment, # of feet with assistance (PT). Outcome: Not Applicable Flowsheets (Taken 12/29/2019 1242) LTG: Pt will propel w/c in home environ  assist needed:: (goal discontinued 2/5 due to lack of progress - AAT) -- Note: goal discontinued 2/5 due to lack of progress - AAT

## 2019-12-30 ENCOUNTER — Inpatient Hospital Stay (HOSPITAL_COMMUNITY): Payer: Medicare Other

## 2019-12-30 ENCOUNTER — Inpatient Hospital Stay (HOSPITAL_COMMUNITY): Payer: Medicare Other | Admitting: Physical Therapy

## 2019-12-30 NOTE — Progress Notes (Signed)
[place] PHYSICAL MEDICINE & REHABILITATION PROGRESS NOTE   Subjective/Complaints:  Pt reports having pelvic pain- not sure what's due, but thinks it's either Norco or Morphine- hasn't gotten pain meds since had an accident and forgot to get nursing to give her- will call now.   Overall, a little frustrated- feels "like needs to get moving".   ROS: Patient denies fever, rash, sore throat, blurred vision, nausea, vomiting, diarrhea, cough, shortness of breath or chest pain,   Headache .    Objective: No results found. No results for input(s): WBC, HGB, HCT, PLT in the last 72 hours. Recent Labs    12/29/19 0745  NA 132*  K 4.9  CL 96*  CO2 28  GLUCOSE 92  BUN 45*  CREATININE 1.29*  CALCIUM 9.1    Intake/Output Summary (Last 24 hours) at 12/30/2019 1333 Last data filed at 12/30/2019 0644 Gross per 24 hour  Intake 180 ml  Output 1675 ml  Net -1495 ml     Physical Exam: Vital Signs Blood pressure (!) 137/56, pulse 91, temperature 98.1 F (36.7 C), resp. rate 19, height 5\' 1"  (1.549 m), weight 84.6 kg, SpO2 95 %. Constitutional: No distress . Vital signs reviewed; meds reviewed; sitting up in bed - frustrated about pain; NAD. HEENT: EOMI, oral membranes moist Neck: supple Cardiovascular: RRR without murmur. No JVD    Respiratory: CTA Bilaterally without wheezes or rales. Normal effort    GI: BS +, non-tender, non-distended  Musculoskeletal:     Comments: ongoing pelvic and LB pain. Edema improved Neurological: oriented to person, month, day, reason she's here. Still delayed but more alert and a little quicker with processing Motor: RUE: 4-/5 proximal to distal LLE: 1-4/5 limited proximally by pain LUE: proximally limited by splint, hand grip 2/5 limited by splint/swelling LLE: 2+/5 proximal to distal Sensation diminished to light touch b/le feet --stable Skin:  Left hip cdi with staples Left upper extremity with sugar tong splint in place. Scattered ecchymoses as  above on all 4 limbs, especially in UE's.--improving Psychiatric: a little more animated today  Assessment/Plan: 1. Functional deficits secondary to polytrauma which require 3+ hours per day of interdisciplinary therapy in a comprehensive inpatient rehab setting.  Physiatrist is providing close team supervision and 24 hour management of active medical problems listed below.  Physiatrist and rehab team continue to assess barriers to discharge/monitor patient progress toward functional and medical goals  Care Tool:  Bathing    Body parts bathed by patient: Left arm, Chest, Abdomen, Front perineal area, Right upper leg, Left upper leg, Face, Right arm   Body parts bathed by helper: Buttocks, Right lower leg, Left lower leg Body parts n/a: Right lower leg, Left lower leg   Bathing assist Assist Level: Moderate Assistance - Patient 50 - 74%     Upper Body Dressing/Undressing Upper body dressing   What is the patient wearing?: Pull over shirt    Upper body assist Assist Level: Moderate Assistance - Patient 50 - 74%    Lower Body Dressing/Undressing Lower body dressing      What is the patient wearing?: Incontinence brief     Lower body assist Assist for lower body dressing: Maximal Assistance - Patient 25 - 49%     Toileting Toileting Toileting Activity did not occur (Clothing management and hygiene only): N/A (no void or bm)  Toileting assist Assist for toileting: Total Assistance - Patient < 25%     Transfers Chair/bed transfer  Transfers assist  Chair/bed transfer  activity did not occur: Safety/medical concerns  Chair/bed transfer assist level: 2 Helpers     Locomotion Ambulation   Ambulation assist   Ambulation activity did not occur: Safety/medical concerns          Walk 10 feet activity   Assist  Walk 10 feet activity did not occur: Safety/medical concerns        Walk 50 feet activity   Assist Walk 50 feet with 2 turns activity did not  occur: Safety/medical concerns         Walk 150 feet activity   Assist Walk 150 feet activity did not occur: Safety/medical concerns         Walk 10 feet on uneven surface  activity   Assist Walk 10 feet on uneven surfaces activity did not occur: Safety/medical concerns         Wheelchair     Assist     Wheelchair activity did not occur: Safety/medical concerns         Wheelchair 50 feet with 2 turns activity    Assist    Wheelchair 50 feet with 2 turns activity did not occur: Safety/medical concerns       Wheelchair 150 feet activity     Assist  Wheelchair 150 feet activity did not occur: Safety/medical concerns       Blood pressure (!) 137/56, pulse 91, temperature 98.1 F (36.7 C), resp. rate 19, height 5\' 1"  (1.549 m), weight 84.6 kg, SpO2 95 %.  Medical Problem List and Plan: 1.  Decreased functional mobility secondary to left displaced femoral neck fracture status post left total hip replacement anterior approach as well as severely left comminuted distal radius fracture status post ORIF 12/08/2019 as well as forearm laceration repair.  Weightbearing as tolerated through left elbow only and weightbearing as tolerated left lower extremity.  No left hip abduction. Most recent CT from 1/27 revealed fx's of left SPR/IPR and left sacrum.              -patient may  shower if left arm and hip wound covered            -remains limited by pain and mentation           2/5 -Continue CIR therapies including PT, OT as tolerated. May have to look at short term SNF d/t ongoing pain/tolerance issues  2.  Antithrombotics: -DVT/anticoagulation: SCDs.  Dopplers negative   lovenox for dvt prophylaxis 40mg  daily             -antiplatelet therapy: Aspirin 325 mg daily 3. Pain Management: continue Hydrocodone and Robaxin as needed  -kpad/ice prn               -MS Contin stopped and hydrocodone decreased d/t lethargy  2/5 more alert today but pain likely will  be limiting. Still not at cognitive baseline either.  2/6- Won't change Norco-can't take tramadol- on allergy list;  -per primary doctor 4. Mood: Prozac 40 mg daily  2/1: xanax 0.25mg  bid at 0700  and 1200 to help with mobility associated anxiety  2/5 stop xanax d/t persistent delays in processing             -antipsychotic agents: N/A 5. Neuropsych: This patient is capable of making decisions on her own behalf. 6. Skin/Wound Care: dc staples from hip today 7. Fluids/Electrolytes/Nutrition: intake poor  Continue to push po 8.  Polycystic kidney disease status post renal transplant 06/02/2012.  Baseline creatinine 1.02-1.29.  Continue Prograf, chronic  prednisone as well as Myfortic.  Followed by Dr. Jimmy Footman            1/25: CMP stable except for low Na 131 and elevated Cr 1.21. Will repeat tomorrow to trend.    1/26 Sodium unchanged, BUN/Cr continue to drift up   -encourage fluids, hold lasix   -Na increased to 133  2/1 sodium down to 127   -dry by labs. Hold lasix  2/2 sodium holding at 127 today   -she is still -2600cc for admit, weight down from 1/31    -will not resume lasix at this time   -check bmet serially  2/3: Na 129  2/5 Na 132 and BUN/Cr improving off lasix   -continue to encourage fluids   -recheck labs Monday  9.  Acute blood loss anemia.  Transfused 2 units packed red blood cells 12/11/2019.               Hgb holding at 9.8 on 1.22, 9.2 on 1/25  2/1: hgb 8.9   10.  Asthma.  Continue inhalers as directed. 11.  History of gout.  Allopurinol daily.  Monitor for any gout flareups 12.  Chronic diastolic congestive heart failure.  Lasix 40 mg daily. Monitor for sign/symptoms of fluid overload.    1/26-?trend, unsure about weights---need more together to assess for trend, she was -20cc for today by I/O's yesterday  -holding lasix at least for tomorrow d/t #8  1/29, 1/30 weight stable  1/31: increased weight, does appear to have increased work of breathing. Received Lasix 40  this morning. Will add additional 20mg  now.   2/5: weights stable to decreased  2/6: weight stable to decreased- down to 84.6- was 85.7kg Filed Weights   12/28/19 0500 12/29/19 0428 12/30/19 0500  Weight: 85.7 kg 85.7 kg 84.6 kg    13. Constipation: sorbitol, SSE   if needed  -scheduled senna-s  -large BM 1/30  2/3: constipated: scheduled Miralax.   2/4 large bm this morning LOS: 15 days A FACE TO FACE EVALUATION WAS PERFORMED  Kayshawn Ozburn 12/30/2019, 1:33 PM

## 2019-12-30 NOTE — Progress Notes (Signed)
Occupational Therapy Session Note  Patient Details  Name: April [name] MRN: [id number] Date of Birth: 1955/12/30  Today's Date: 12/30/2019 OT Individual Time: 1400-1418 OT Individual Time Calculation (min): 18 min    Short Term Goals: Week 2:  OT Short Term Goal 1 (Week 2): Pt will transfer to [facility] wiht MAX A of 1 and LRAD OT Short Term Goal 2 (Week 2): Pt will tolerate sitting EOB/getting OOB at least 1x per day OT Short Term Goal 3 (Week 2): Pt will thread 1LE into pants wiht AE PRN OT Short Term Goal 4 (Week 2): Pt will demo alternative pain management strategies with min VC to improve participation in mobility/ADLs  Skilled Therapeutic Interventions/Progress Updates:    Pt received supine with 2/10 faces pain scale. Attempted to keep tempo of session going and reduce discussion of pain to reduce anxiety around pain. Pt rolled onto her L side with min A, grimacing in pain. Cueing for breathing provided. Pt completed sidelying > sitting EOB with min A. Pt maintained static sitting balance with (S) while OT fixed pt's hair- performed to motivate pt to remain sitting and not immediately return to supine. Pt lasted ~4 min EOB before returning to supine with min A. Pt was positioned with wedge under her R hip to offset sacral pressure. Pt left supine with all needs met, bed alarm set.   Therapy Documentation Precautions:  Precautions Precautions: Fall Required Braces or Orthoses: Sling Restrictions Weight Bearing Restrictions: Yes LUE Weight Bearing: Weight bear through elbow only LLE Weight Bearing: Weight bearing as tolerated Other Position/Activity Restrictions: no L hip active abduction   Therapy/Group: Individual Therapy  Curtis Sites 12/30/2019, 7:29 AM

## 2019-12-30 NOTE — Plan of Care (Signed)
  Problem: Consults Goal: RH GENERAL PATIENT EDUCATION Description: See Patient Education module for education specifics. Outcome: Progressing   Problem: RH BOWEL ELIMINATION Goal: RH STG MANAGE BOWEL WITH ASSISTANCE Description: STG Manage Bowel with Mod Assistance. Outcome: Progressing   Problem: RH BLADDER ELIMINATION Goal: RH STG MANAGE BLADDER WITH ASSISTANCE Description: STG Manage Bladder With Mod Assistance Outcome: Progressing   Problem: RH SKIN INTEGRITY Goal: RH STG SKIN FREE OF INFECTION/BREAKDOWN Description: No new breakdown with min assist  Outcome: Progressing Goal: RH STG ABLE TO PERFORM INCISION/WOUND CARE W/ASSISTANCE Description: STG Able To Perform Incision/Wound Care With Mod Assistance. Outcome: Progressing   Problem: RH SAFETY Goal: RH STG ADHERE TO SAFETY PRECAUTIONS W/ASSISTANCE/DEVICE Description: STG Adhere to Safety Precautions With Mod Assistance/Device. Outcome: Progressing   Problem: RH PAIN MANAGEMENT Goal: RH STG PAIN MANAGED AT OR BELOW PT'S PAIN GOAL Description: < 4 out of 10.  Outcome: Progressing

## 2019-12-30 NOTE — Progress Notes (Signed)
Physical Therapy Session Note  Patient Details  Name: April [name] MRN: [id number] Date of Birth: 09/07/1956  Today's Date: 12/30/2019 PT Individual Time: 1130-1157 PT Individual Time Calculation (min): 27 min   Short Term Goals: Week 1:  PT Short Term Goal 1 (Week 1): Pt will initiate transfer training PT Short Term Goal 1 - Progress (Week 1): Met PT Short Term Goal 2 (Week 1): Pt will perform bed mobility w/ max assist PT Short Term Goal 2 - Progress (Week 1): Met PT Short Term Goal 3 (Week 1): Pt will tolerate 60 min of upright activity w/ minimal increase in fatigue PT Short Term Goal 3 - Progress (Week 1): Met  Skilled Therapeutic Interventions/Progress Updates:  Pt was seen bedside in the am. Pt transferred supine to edge of bed with mod A and verbal cues. Pt tolerated edge of bed about 2 minutes with min to mod A and verbal cues. Pt stating she couldn't do it, it was to painful. Pt returned to supine with mod A and verbal cues. Pt moved up in bed with max A and verbal cues. Performed ARROM B heel slides, 2 sets x 10 reps each. Pt left sitting up in bed at 30 at end of treatment with call bell within reach and bed alarm on.   Therapy Documentation Precautions:  Precautions Precautions: Fall Required Braces or Orthoses: Sling Restrictions Weight Bearing Restrictions: Yes LUE Weight Bearing: Weight bear through elbow only LLE Weight Bearing: Weight bearing as tolerated Other Position/Activity Restrictions: no L hip active abduction General:   Vital Signs: Oxygen Therapy SpO2: 95 % O2 Device: Room Air Pain: Pt c/o pain with any mobility.   Therapy/Group: Individual Therapy  Mitchell, James G 12/30/2019, 12:44 PM  

## 2019-12-31 ENCOUNTER — Inpatient Hospital Stay (HOSPITAL_COMMUNITY): Payer: Medicare Other

## 2019-12-31 ENCOUNTER — Inpatient Hospital Stay (HOSPITAL_COMMUNITY): Payer: Medicare Other | Admitting: Physical Therapy

## 2019-12-31 NOTE — Progress Notes (Signed)
[facility] PHYSICAL MEDICINE & REHABILITATION PROGRESS NOTE   Subjective/Complaints:  Pt reports exhausted this AM- said slept but still tired.   Per nursing, no specific issues, except trying to keep pt's pain controlled with Norco. Is allergic to Tramadol- so cannot add.    ROS: Patient denies fever, rash, sore throat, blurred vision, nausea, vomiting, diarrhea, cough, shortness of breath or chest pain,   Headache .    Objective: No results found. No results for input(s): WBC, HGB, HCT, PLT in the last 72 hours. Recent Labs    12/29/19 0745  NA 132*  K 4.9  CL 96*  CO2 28  GLUCOSE 92  BUN 45*  CREATININE 1.29*  CALCIUM 9.1    Intake/Output Summary (Last 24 hours) at 12/31/2019 1125 Last data filed at 12/31/2019 0930 Gross per 24 hour  Intake 480 ml  Output 900 ml  Net -420 ml     Physical Exam: Vital Signs Blood pressure (!) 138/48, pulse 89, temperature 98 F (36.7 C), resp. rate 18, height 5\' 1"  (1.549 m), weight 84.1 kg, SpO2 96 %. Constitutional: No distress . Vital signs reviewed; meds reviewed ; sitting up in bed -very tired appearing and c/o fatigue; NAD. HEENT: EOMI, oral membranes moist Neck: supple Cardiovascular: RRR without murmur. No JVD    Respiratory: CTA Bilaterally without wheezes or rales. Normal effort    GI: BS +, non-tender, non-distended  Musculoskeletal:     Comments: ongoing pelvic and LB pain. Edema improved Neurological: oriented to person, month, day, reason she's here. Still delayed but more alert and a little quicker with processing Motor: RUE: 4-/5 proximal to distal LLE: 1-4/5 limited proximally by pain LUE: proximally limited by splint, hand grip 2/5 limited by splint/swelling LLE: 2+/5 proximal to distal Sensation diminished to light touch b/le feet --stable Skin:  Left hip cdi with staples Left upper extremity with sugar tong splint in place. Scattered ecchymoses as above on all 4 limbs, especially in  UE's.--improving Psychiatric: a little more animated today  Assessment/Plan: 1. Functional deficits secondary to polytrauma which require 3+ hours per day of interdisciplinary therapy in a comprehensive inpatient rehab setting.  Physiatrist is providing close team supervision and 24 hour management of active medical problems listed below.  Physiatrist and rehab team continue to assess barriers to discharge/monitor patient progress toward functional and medical goals  Care Tool:  Bathing    Body parts bathed by patient: Left arm, Chest, Abdomen, Front perineal area, Right upper leg, Left upper leg, Face, Right arm   Body parts bathed by helper: Buttocks, Right lower leg, Left lower leg Body parts n/a: Right lower leg, Left lower leg   Bathing assist Assist Level: Moderate Assistance - Patient 50 - 74%     Upper Body Dressing/Undressing Upper body dressing   What is the patient wearing?: Pull over shirt    Upper body assist Assist Level: Moderate Assistance - Patient 50 - 74%    Lower Body Dressing/Undressing Lower body dressing      What is the patient wearing?: Incontinence brief     Lower body assist Assist for lower body dressing: Maximal Assistance - Patient 25 - 49%     Toileting Toileting Toileting Activity did not occur (Clothing management and hygiene only): N/A (no void or bm)  Toileting assist Assist for toileting: Total Assistance - Patient < 25%     Transfers Chair/bed transfer  Transfers assist  Chair/bed transfer activity did not occur: Safety/medical concerns  Chair/bed transfer assist  level: 2 Helpers     Locomotion Ambulation   Ambulation assist   Ambulation activity did not occur: Safety/medical concerns          Walk 10 feet activity   Assist  Walk 10 feet activity did not occur: Safety/medical concerns        Walk 50 feet activity   Assist Walk 50 feet with 2 turns activity did not occur: Safety/medical concerns          Walk 150 feet activity   Assist Walk 150 feet activity did not occur: Safety/medical concerns         Walk 10 feet on uneven surface  activity   Assist Walk 10 feet on uneven surfaces activity did not occur: Safety/medical concerns         Wheelchair     Assist     Wheelchair activity did not occur: Safety/medical concerns         Wheelchair 50 feet with 2 turns activity    Assist    Wheelchair 50 feet with 2 turns activity did not occur: Safety/medical concerns       Wheelchair 150 feet activity     Assist  Wheelchair 150 feet activity did not occur: Safety/medical concerns       Blood pressure (!) 138/48, pulse 89, temperature 98 F (36.7 C), resp. rate 18, height 5\' 1"  (1.549 m), weight 84.1 kg, SpO2 96 %.  Medical Problem List and Plan: 1.  Decreased functional mobility secondary to left displaced femoral neck fracture status post left total hip replacement anterior approach as well as severely left comminuted distal radius fracture status post ORIF 12/08/2019 as well as forearm laceration repair.  Weightbearing as tolerated through left elbow only and weightbearing as tolerated left lower extremity.  No left hip abduction. Most recent CT from 1/27 revealed fx's of left SPR/IPR and left sacrum.              -patient may  shower if left arm and hip wound covered            -remains limited by pain and mentation           2/5 -Continue CIR therapies including PT, OT as tolerated. May have to look at short term SNF d/t ongoing pain/tolerance issues  2.  Antithrombotics: -DVT/anticoagulation: SCDs.  Dopplers negative   lovenox for dvt prophylaxis 40mg  daily             -antiplatelet therapy: Aspirin 325 mg daily 3. Pain Management: continue Hydrocodone and Robaxin as needed  -kpad/ice prn               -MS Contin stopped and hydrocodone decreased d/t lethargy  2/5 more alert today but pain likely will be limiting. Still not at cognitive  baseline either.  2/6- Won't change Norco-can't take tramadol- on allergy list;  -per primary doctor 4. Mood: Prozac 40 mg daily  2/1: xanax 0.25mg  bid at 0700  and 1200 to help with mobility associated anxiety  2/5 stop xanax d/t persistent delays in processing             -antipsychotic agents: N/A 5. Neuropsych: This patient is capable of making decisions on her own behalf. 6. Skin/Wound Care: dc staples from hip today 7. Fluids/Electrolytes/Nutrition: intake poor  Continue to push po 8.  Polycystic kidney disease status post renal transplant 06/02/2012.  Baseline creatinine 1.02-1.29.  Continue Prograf, chronic prednisone as well as Myfortic.  Followed by Dr. Jimmy Footman  1/25: CMP stable except for low Na 131 and elevated Cr 1.21. Will repeat tomorrow to trend.    1/26 Sodium unchanged, BUN/Cr continue to drift up   -encourage fluids, hold lasix   -Na increased to 133  2/1 sodium down to 127   -dry by labs. Hold lasix  2/2 sodium holding at 127 today   -she is still -2600cc for admit, weight down from 1/31    -will not resume lasix at this time   -check bmet serially  2/3: Na 129  2/5 Na 132 and BUN/Cr improving off lasix   -continue to encourage fluids   -recheck labs Monday  9.  Acute blood loss anemia.  Transfused 2 units packed red blood cells 12/11/2019.               Hgb holding at 9.8 on 1.22, 9.2 on 1/25  2/1: hgb 8.9   10.  Asthma.  Continue inhalers as directed. 11.  History of gout.  Allopurinol daily.  Monitor for any gout flareups 12.  Chronic diastolic congestive heart failure.  Lasix 40 mg daily. Monitor for sign/symptoms of fluid overload.    1/26-?trend, unsure about weights---need more together to assess for trend, she was -20cc for today by I/O's yesterday  -holding lasix at least for tomorrow d/t #8  1/29, 1/30 weight stable  1/31: increased weight, does appear to have increased work of breathing. Received Lasix 40 this morning. Will add additional  20mg  now.   2/5: weights stable to decreased  2/6-7: weight stable to decreased- down to 84.1 kg- was 85.7kg Filed Weights   12/29/19 0428 12/30/19 0500 12/31/19 0541  Weight: 85.7 kg 84.6 kg 84.1 kg    13. Constipation: sorbitol, SSE   if needed  -scheduled senna-s  -large BM 1/30  2/3: constipated: scheduled Miralax.   2/4 large bm this morning LOS: 16 days A FACE TO FACE EVALUATION WAS PERFORMED  April [name] 12/31/2019, 11:25 AM

## 2019-12-31 NOTE — Progress Notes (Signed)
Physical Therapy Session Note  Patient Details  Name: April [name] MRN: [id number] Date of Birth: [birth year]  Today's Date: 12/31/2019 PT Individual Time: 2493-2419 PT Individual Time Calculation (min): 32 min   Short Term Goals: Week 1:  PT Short Term Goal 1 (Week 1): Pt will initiate transfer training PT Short Term Goal 1 - Progress (Week 1): Met PT Short Term Goal 2 (Week 1): Pt will perform bed mobility w/ max assist PT Short Term Goal 2 - Progress (Week 1): Met PT Short Term Goal 3 (Week 1): Pt will tolerate 60 min of upright activity w/ minimal increase in fatigue PT Short Term Goal 3 - Progress (Week 1): Met  Skilled Therapeutic Interventions/Progress Updates:  Pt was seen bedside in the pm. Pt performed AAROM B LEs, hip flex and SAQs, 3 sets x 10 reps each. Pt transferred supine to edge of bed with min A and side rail and verbal cues to R side of the bed. Pt tolerated edge of bed about 5 minutes with c/g to min A. Pt transferred edge of bed to supine with mod A and verbal cues. Pt moved up in bed with mod A and verbal cues. Pt left sitting up in bed with head of bed elevated to 30 degrees and call bell within reach.   Therapy Documentation Precautions:  Precautions Precautions: Fall Required Braces or Orthoses: Sling Restrictions Weight Bearing Restrictions: Yes LUE Weight Bearing: Weight bear through elbow only LLE Weight Bearing: Weight bearing as tolerated Other Position/Activity Restrictions: no L hip active abduction General:   Pain: No c/o at rest, c/o back low back and down B LEs when sitting on edge of bed.   Therapy/Group: Individual Therapy  Dub Amis 12/31/2019, 2:32 PM

## 2019-12-31 NOTE — Progress Notes (Signed)
Occupational Therapy Weekly Progress Note  Patient Details  Name: April [name] MRN: [id number] Date of Birth: Nov 26, 1955  Beginning of progress report period: December 24, 2019 End of progress report period: December 31, 2019  Today's Date: 12/31/2019 OT Individual Time: 8250-5397 OT Individual Time Calculation (min): 25 min    Patient has met 1 of 3 short term goals.  Pt has made little progress this reporting period. Pt remains limited by severe pain that is heavily related to high anxiety. With increases in pain medication pt is unable to remain alert enough to participate in therapy functionally. Pt requires min-mod A overall for transfers when she is able to do so. Pt requires mod A overall for ADLs at bed level.   Discussions are occurring to transition POC for d/c to SNF.   Patient continues to demonstrate the following deficits: muscle weakness, decreased cardiorespiratoy endurance, decreased initiation, decreased awareness, decreased problem solving and delayed processing and decreased standing balance and therefore will continue to benefit from skilled OT intervention to enhance overall performance with BADL and Reduce care partner burden.  Patient not progressing toward long term goals.  See goal revision..  Plan of care revisions: Goals downgraded to min A overall to reflect lack of progress toward goals and participation.  OT Short Term Goals Week 2:  OT Short Term Goal 1 (Week 2): Pt will transfer to [facility] wiht MAX A of 1 and LRAD OT Short Term Goal 1 - Progress (Week 2): Met OT Short Term Goal 2 (Week 2): Pt will tolerate sitting EOB/getting OOB at least 1x per day OT Short Term Goal 2 - Progress (Week 2): Progressing toward goal OT Short Term Goal 3 (Week 2): Pt will thread 1LE into pants wiht AE PRN OT Short Term Goal 4 (Week 2): Pt will demo alternative pain management strategies with min VC to improve participation in mobility/ADLs OT Short Term Goal 4 - Progress (Week 2):  Progressing toward goal Week 3:  OT Short Term Goal 1 (Week 3): STG= LTG d/t ELOS  Skilled Therapeutic Interventions/Progress Updates:    Pt received supine sleeping, easily awoken. Pt calm and not in pain (faces scale) at rest. Discussed d/c planning and SNF vs  Home. Pt agreeable to SNF as long as insurance will cover her stay. Shoes donned bed level with total A. Pt completed rolling L with CGA and transitioned into sitting EOB with min A. Pt lasted ~1 min before abruptly returning to supine despite plan/agreeance to attempt transfer to recliner. Pt stated she was now incontinent of BM and requested to use bed pan. Attempted to encourage pt to use [facility] but pt adamantly refused. Pt was placed on bed pan and left supine with call bell to alert staff when she was finished.   Therapy Documentation Precautions:  Precautions Precautions: Fall Required Braces or Orthoses: Sling Restrictions Weight Bearing Restrictions: Yes LUE Weight Bearing: Weight bear through elbow only LLE Weight Bearing: Weight bearing as tolerated Other Position/Activity Restrictions: no L hip active abduction   Therapy/Group: Individual Therapy  Curtis Sites 12/31/2019, 7:18 AM

## 2019-12-31 NOTE — Plan of Care (Signed)
  Problem: RH Balance Goal: LTG: Patient will maintain dynamic sitting balance (OT) Description: LTG:  Patient will maintain dynamic sitting balance with assistance during activities of daily living (OT) Flowsheets (Taken 12/31/2019 0720) LTG: Pt will maintain dynamic sitting balance during ADLs with: (downgraded 2-7 2/2 lack of progress) Supervision/Verbal cueing   Problem: Sit to Stand Goal: LTG:  Patient will perform sit to stand in prep for activites of daily living with assistance level (OT) Description: LTG:  Patient will perform sit to stand in prep for activites of daily living with assistance level (OT) Flowsheets (Taken 12/31/2019 0720) LTG: PT will perform sit to stand in prep for activites of daily living with assistance level: (downgraded 2-7 2/2 lack of progress) Minimal Assistance - Patient > 75%   Problem: RH Bathing Goal: LTG Patient will bathe all body parts with assist levels (OT) Description: LTG: Patient will bathe all body parts with assist levels (OT) Flowsheets (Taken 12/31/2019 0720) LTG: Pt will perform bathing with assistance level/cueing: (downgraded 2-7 2/2 lack of progress) Minimal Assistance - Patient > 75%   Problem: RH Dressing Goal: LTG Patient will perform upper body dressing (OT) Description: LTG Patient will perform upper body dressing with assist, with/without cues (OT). Flowsheets (Taken 12/31/2019 0720) LTG: Pt will perform upper body dressing with assistance level of: (downgraded 2-7 2/2 lack of progress) Minimal Assistance - Patient > 75% Goal: LTG Patient will perform lower body dressing w/assist (OT) Description: LTG: Patient will perform lower body dressing with assist, with/without cues in positioning using equipment (OT) Flowsheets (Taken 12/31/2019 0720) LTG: Pt will perform lower body dressing with assistance level of: (downgraded 2-7 2/2 lack of progress) Minimal Assistance - Patient > 75%  Goals downgraded to min A overall to reflect lack of  progress and participation. D/c location still pending.  Problem: RH Toileting Goal: LTG Patient will perform toileting task (3/3 steps) with assistance level (OT) Description: LTG: Patient will perform toileting task (3/3 steps) with assistance level (OT)  Flowsheets (Taken 12/31/2019 0720) LTG: Pt will perform toileting task (3/3 steps) with assistance level: (downgraded 2-7 2/2 lack of progress) Moderate Assistance - Patient 50 - 74%   Problem: RH Tub/Shower Transfers Goal: LTG Patient will perform tub/shower transfers w/assist (OT) Description: LTG: Patient will perform tub/shower transfers with assist, with/without cues using equipment (OT) Outcome: Not Applicable Flowsheets (Taken 12/31/2019 0720) LTG: Pt will perform tub/shower stall transfers with assistance level of: (goal discontinued 2-7 2/2 lack of progress) --

## 2020-01-01 ENCOUNTER — Inpatient Hospital Stay (HOSPITAL_COMMUNITY): Payer: Medicare Other | Admitting: Physical Therapy

## 2020-01-01 ENCOUNTER — Inpatient Hospital Stay (HOSPITAL_COMMUNITY): Payer: Medicare Other

## 2020-01-01 LAB — BASIC METABOLIC PANEL
Anion gap: 8 (ref 5–15)
BUN: 23 mg/dL (ref 8–23)
CO2: 25 mmol/L (ref 22–32)
Calcium: 9 mg/dL (ref 8.9–10.3)
Chloride: 103 mmol/L (ref 98–111)
Creatinine, Ser: 0.88 mg/dL (ref 0.44–1.00)
GFR calc Af Amer: >60 mL/min (ref >60)
GFR calc non Af Amer: >60 mL/min (ref >60)
Glucose, Bld: 93 mg/dL (ref 70–99)
Potassium: 4.6 mmol/L (ref 3.5–5.1)
Sodium: 136 mmol/L (ref 135–145)

## 2020-01-01 LAB — CBC
HCT: 29.9 % — ABNORMAL LOW (ref 36.0–46.0)
Hemoglobin: 8.9 g/dL — ABNORMAL LOW (ref 12.0–15.0)
MCH: 28.8 pg (ref 26.0–34.0)
MCHC: 29.8 g/dL — ABNORMAL LOW (ref 30.0–36.0)
MCV: 96.8 fL (ref 80.0–100.0)
Platelets: 289 10*3/uL (ref 150–400)
RBC: 3.09 MIL/uL — ABNORMAL LOW (ref 3.87–5.11)
RDW: 20.7 % — ABNORMAL HIGH (ref 11.5–15.5)
WBC: 7.6 10*3/uL (ref 4.0–10.5)
nRBC: 0 % (ref 0.0–0.2)

## 2020-01-01 MED ORDER — LOPERAMIDE HCL 2 MG PO CAPS
2.0000 mg | ORAL_CAPSULE | ORAL | Status: DC | PRN
  Administered 2020-01-02: 08:00:00 2 mg via ORAL
  Filled 2020-01-01: qty 1

## 2020-01-01 NOTE — Progress Notes (Signed)
Physical Therapy Session Note  Patient Details  Name: April [name] MRN: [id number] Date of Birth: 24-Jun-1956  Today's Date: 01/01/2020 PT Individual Time: 1130-1155 PT Individual Time Calculation (min): 25 min   Short Term Goals: Week 3:  PT Short Term Goal 1 (Week 3): =LTGs due to ELOS  Skilled Therapeutic Interventions/Progress Updates:   Pt in supine and daughter on phone, SW also present. Discussed d/c options over phone w/ daughter and w/ pt in person. Discussed that pt requires skilled care w/ any OOB mobility 2/2 inconsistencies in function. Pt's daughter would be safe to assist as she is a licensed OT, however it would likely be unsafe w/ other members of the family assisting during moments that pt is max-total to transfer. Told daughter that I do believe pt would benefit the most from being home w/ a familiar people that she trusts, however it would have to be a safe set up. Discussed that pt would likely benefit more from a lower level of rehab 2/2 pain and poor OOB tolerance. Daughter verbalized understanding and will continue to speak with her family to make the best decision.  After discussion, pt politely declining attempts at getting OOB and to EOB. Pt actually appearing much more alert today compared to last session with this therapist. Discussed theory behind central pain sensitization and importance of slow progression of functional mobility and anxiety management strategies to decrease peripheral response to pain. Pt able to bring up topics that yesterday's therapist covered w/ her regarding pain science, she seemed to understand and knows this will be a long recovery for her. Pt performed gentle LLE AROM exercises in chair position in bed, SAQs 2x5 and knee march 1x5. Pt w/ increased L hamstring spasm at end of session (4/10 on FACES scale), returned to supine position w/ some relief and provided w/ hot packs at HS as well. Pt ended session in supine, all needs in reach.    Therapy Documentation Precautions:  Precautions Precautions: Fall Required Braces or Orthoses: Sling Restrictions Weight Bearing Restrictions: Yes LUE Weight Bearing: Weight bear through elbow only LLE Weight Bearing: Weight bearing as tolerated Other Position/Activity Restrictions: no L hip active abduction Vital Signs: Oxygen Therapy SpO2: 97 % Pain: Pain Assessment Pain Scale: 0-10 Pain Score: 0-No pain Faces Pain Scale: No hurt  Therapy/Group: Individual Therapy  Odyn Turko Clent Demark 01/01/2020, 12:11 PM

## 2020-01-01 NOTE — Progress Notes (Signed)
Occupational Therapy Session Note  Patient Details  Name: April [name] MRN: [id number] Date of Birth: [birth year]  Today's Date: 01/01/2020 OT Individual Time: 0936-1000 OT Individual Time Calculation (min): 24 min    Week 3:  OT Short Term Goal 1 (Week 3): STG= LTG d/t ELOS  Skilled Therapeutic Interventions/Progress Updates:    Pt received supine with no c/o pain at rest (faces scale). Care coordination completed with team re d/c planning. Pt completed bed mobility to EOB with CGA, increased time required and 5/10 faces pain scale. Pt distracted EOB with UB bathing and she was able to complete with min A. Pt able to tolerate sitting for approx. 8 min. Max A for hair care EOB. Pt returned to supine despite therapist encouraging OOB mobility. Pt was assisted in changing brief d/t small stool incontinence. Pt completed peri hygiene with min A at bed level. Pt was left supine with all needs met, bed alarm set.   Therapy Documentation Precautions:  Precautions Precautions: Fall Required Braces or Orthoses: Sling Restrictions Weight Bearing Restrictions: Yes LUE Weight Bearing: Weight bear through elbow only LLE Weight Bearing: Weight bearing as tolerated Other Position/Activity Restrictions: no L hip active abduction  Therapy/Group: Individual Therapy  Curtis Sites 01/01/2020, 6:53 AM

## 2020-01-01 NOTE — Progress Notes (Signed)
[facility] PHYSICAL MEDICINE & REHABILITATION PROGRESS NOTE   Subjective/Complaints: Having a lot of loose stool this weekend. Pain is an ongoing problem. Asked if her dc date was still tomorrow.   ROS: Patient denies fever, rash, sore throat, blurred vision, nausea, vomiting,   cough, shortness of breath or chest pain,  headache, or mood change.    Objective: No results found. Recent Labs    01/01/20 0530  WBC 7.6  HGB 8.9*  HCT 29.9*  PLT 289   Recent Labs    01/01/20 0530  NA 136  K 4.6  CL 103  CO2 25  GLUCOSE 93  BUN 23  CREATININE 0.88  CALCIUM 9.0    Intake/Output Summary (Last 24 hours) at 01/01/2020 1043 Last data filed at 01/01/2020 0700 Gross per 24 hour  Intake 340 ml  Output 450 ml  Net -110 ml     Physical Exam: Vital Signs Blood pressure 140/61, pulse 93, temperature 98.6 F (37 C), resp. rate 18, height 5\' 1"  (1.549 m), weight 83 kg, SpO2 97 %. Constitutional: No distress . Vital signs reviewed. HEENT: EOMI, oral membranes moist Neck: supple Cardiovascular: RRR without murmur. No JVD    Respiratory: CTA Bilaterally without wheezes or rales. Normal effort    GI: BS +, non-tender, non-distended  Musculoskeletal:     Comments: ongoing pelvic and LB pain. Edema present but better Neurological: oriented to person, month, day, reason she's here. Quicker processing better insight. Motor: RUE: 4-/5 proximal to distal LLE: 1-4/5 limited proximally by pain LUE: proximally limited by splint, hand grip 2/5 limited by splint/swelling LLE: 2+/5 proximal to distal Sensation diminished to light touch b/le feet --stable Skin:  Left hip cdi. Staples out Left upper extremity with sugar tong splint in place. Scattered ecchymoses as above on all 4 limbs, especially in UE's.--gradually improving.  Psychiatric: more alert.   Assessment/Plan: 1. Functional deficits secondary to polytrauma which require 3+ hours per day of interdisciplinary therapy in a  comprehensive inpatient rehab setting.  Physiatrist is providing close team supervision and 24 hour management of active medical problems listed below.  Physiatrist and rehab team continue to assess barriers to discharge/monitor patient progress toward functional and medical goals  Care Tool:  Bathing    Body parts bathed by patient: Left arm, Chest, Abdomen, Front perineal area, Right upper leg, Left upper leg, Face, Right arm   Body parts bathed by helper: Buttocks, Right lower leg, Left lower leg Body parts n/a: Right lower leg, Left lower leg   Bathing assist Assist Level: Moderate Assistance - Patient 50 - 74%     Upper Body Dressing/Undressing Upper body dressing   What is the patient wearing?: Pull over shirt    Upper body assist Assist Level: Moderate Assistance - Patient 50 - 74%    Lower Body Dressing/Undressing Lower body dressing      What is the patient wearing?: Incontinence brief     Lower body assist Assist for lower body dressing: Maximal Assistance - Patient 25 - 49%     Toileting Toileting Toileting Activity did not occur (Clothing management and hygiene only): N/A (no void or bm)  Toileting assist Assist for toileting: Total Assistance - Patient < 25%     Transfers Chair/bed transfer  Transfers assist  Chair/bed transfer activity did not occur: Safety/medical concerns  Chair/bed transfer assist level: 2 Helpers     Locomotion Ambulation   Ambulation assist   Ambulation activity did not occur: Safety/medical concerns  Walk 10 feet activity   Assist  Walk 10 feet activity did not occur: Safety/medical concerns        Walk 50 feet activity   Assist Walk 50 feet with 2 turns activity did not occur: Safety/medical concerns         Walk 150 feet activity   Assist Walk 150 feet activity did not occur: Safety/medical concerns         Walk 10 feet on uneven surface  activity   Assist Walk 10 feet on uneven  surfaces activity did not occur: Safety/medical concerns         Wheelchair     Assist     Wheelchair activity did not occur: Safety/medical concerns         Wheelchair 50 feet with 2 turns activity    Assist    Wheelchair 50 feet with 2 turns activity did not occur: Safety/medical concerns       Wheelchair 150 feet activity     Assist  Wheelchair 150 feet activity did not occur: Safety/medical concerns       Blood pressure 140/61, pulse 93, temperature 98.6 F (37 C), resp. rate 18, height 5\' 1"  (1.549 m), weight 83 kg, SpO2 97 %.  Medical Problem List and Plan: 1.  Decreased functional mobility secondary to left displaced femoral neck fracture status post left total hip replacement anterior approach as well as severely left comminuted distal radius fracture status post ORIF 12/08/2019 as well as forearm laceration repair.  Weightbearing as tolerated through left elbow only and weightbearing as tolerated left lower extremity.  No left hip abduction. Most recent CT from 1/27 revealed fx's of left SPR/IPR and left sacrum.              -patient may  shower if left arm and hip wound covered            -remains limited by pain and mentation           2/8 -Continue CIR therapies including PT, OT as tolerated. May have to look at short term SNF d/t ongoing pain/tolerance issues  2.  Antithrombotics: -DVT/anticoagulation: SCDs.  Dopplers negative   lovenox for dvt prophylaxis 40mg  daily             -antiplatelet therapy: Aspirin 325 mg daily 3. Pain Management: continue Hydrocodone and Robaxin as needed  -kpad/ice prn               -MS Contin stopped and hydrocodone decreased d/t lethargy  2/5 more alert today but pain likely will be limiting. Still not at cognitive baseline either.  2/8-pain about the same. Cognitively improved 4. Mood: Prozac 40 mg daily  2/1: xanax 0.25mg  bid at 0700  and 1200 to help with mobility associated anxiety  2/5 stop xanax d/t  persistent delays in processing             -antipsychotic agents: N/A 5. Neuropsych: This patient is capable of making decisions on her own behalf. 6. Skin/Wound Care: dc staples from hip today 7. Fluids/Electrolytes/Nutrition: intake poor  Continue to push po 8.  Polycystic kidney disease status post renal transplant 06/02/2012.  Baseline creatinine 1.02-1.29.  Continue Prograf, chronic prednisone as well as Myfortic.  Followed by Dr. Jimmy Footman            1/25: CMP stable except for low Na 131 and elevated Cr 1.21. Will repeat tomorrow to trend.    1/26 Sodium unchanged, BUN/Cr continue to drift up   -  encourage fluids, hold lasix   -Na increased to 133  2/1 sodium down to 127   -dry by labs. Hold lasix  2/2 sodium holding at 127 today   -she is still -2600cc for admit, weight down from 1/31    -will not resume lasix at this time   -check bmet serially  2/8 Na+ back to 136   BUN/Cr almost normal   Continue to encourage PO   Avoid diuretics   Intake fair at best 9.  Acute blood loss anemia.  Transfused 2 units packed red blood cells 12/11/2019.               Hgb holding at 9.8 on 1.22, 9.2 on 1/25  2/1: hgb 8.9   10.  Asthma.  Continue inhalers as directed. 11.  History of gout.  Allopurinol daily.  Monitor for any gout flareups 12.  Chronic diastolic congestive heart failure.  Lasix 40 mg daily. Monitor for sign/symptoms of fluid overload.    1/26-?trend, unsure about weights---need more together to assess for trend, she was -20cc for today by I/O's yesterday  -holding lasix at least for tomorrow d/t #8  1/29, 1/30 weight stable  1/31: increased weight, does appear to have increased work of breathing. Received Lasix 40 this morning. Will add additional 20mg  now.   2/8 weights slowly decreasing   Filed Weights   12/30/19 0500 12/31/19 0541 01/01/20 0559  Weight: 84.6 kg 84.1 kg 83 kg    13. Constipation: sorbitol, SSE   if needed  -scheduled senna-s  -large BM 1/30  2/3:  constipated: scheduled Miralax.   2/8 multiple mushy bms    -holding miralax and senokot-s LOS: 17 days A FACE TO [place] 01/01/2020, 10:43 AM

## 2020-01-01 NOTE — Progress Notes (Addendum)
Social Work Patient ID: April [name], female   DOB: 1955/11/25, 64 y.o.   MRN: [id number]   SW met with pt in room to follow-up about discharge plan home vs SNF. Pt called her dtr [name] to dsicuss further. Pt and family unsure on SNF due to [place], however, recognize the burden associated with going home, and also being concerned about contracting COVID in a SNF. During session, PT Amy was present and addressed pt dtr concerns related to pt gains/limitations in therapy. SW discussed will send out referral to SNF locations, and will allow pt and family to discuss further.   NCPASRR: [id number] E- expires on 01/28/2020 (limited for 30 days ro less of rehab). SW sent out SNF referral to [facility].   Loralee Pacas, MSW, [place] Office (preferred): [phone] Cell: [phone] Fax: [phone]

## 2020-01-01 NOTE — NC FL2 (Signed)
[place] LEVEL OF CARE SCREENING TOOL     IDENTIFICATION  Patient Name: April [name] Birthdate: 07-05-1956 Sex: female Admission Date (Current Location): 12/15/2019  [facility] and [place] Number:  Herbalist and Address:  [facility], [place] [address] [place], [place] [address]      Provider Number: [id number]  Attending Physician Name and Address:  Meredith Staggers, MD  Relative Name and Phone Number:  Lummie Montijo #[phone]    Current Level of Care: Hospital Recommended Level of Care: [place] Prior Approval Number:    Date Approved/Denied:   PASRR Number: [id number]  Discharge Plan: SNF    Current Diagnoses: Patient Active Problem List   Diagnosis Date Noted  . Fracture of left superior pubic ramus (HCC)   . Pain   . Anxiety state   . Left displaced femoral neck fracture ([place]) 12/15/2019  . Status post total hip replacement, left   . Post-op pain   . Acute blood loss anemia   . Polycystic kidney   . Closed left hip fracture ([place]) 12/07/2019  . Left wrist fracture 12/07/2019  . Medication management 09/22/2019  . Physical deconditioning 09/22/2019  . Hyponatremia 06/29/2019  . Acute respiratory failure with hypoxia ([place]) 04/19/2019  . Acute on chronic diastolic CHF (congestive heart failure) ([place]) 04/19/2019  . Bilateral closed proximal tibial fracture 12/21/2018  . Closed right ankle fracture 12/21/2018  . Fracture 12/21/2018  . Lower urinary tract infectious disease 10/03/2018  . Asthma, chronic, unspecified asthma severity, with acute exacerbation 10/03/2018  . SIRS (systemic inflammatory response syndrome) ([place]) 10/03/2018  . Nausea vomiting and diarrhea 10/02/2018  . Chronic diastolic CHF (congestive heart failure) ([place]) 10/01/2017  . Influenza B 10/01/2017  . Persistent asthma with status asthmaticus   . Pneumonia 07/15/2016  . Asthma, mild intermittent 07/15/2016   . GERD (gastroesophageal reflux disease) 07/15/2016  . Renal transplant recipient 07/15/2016  . Hyperlipidemia 07/15/2016  . Depression 07/15/2016  . Bronchitis, mucopurulent recurrent ([place]) 08/08/2014  . Chronic cough 07/24/2014  . End stage renal disease ([place]) 03/23/2012  . 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, implant, and graft 03/23/2012    Orientation RESPIRATION BLADDER Height & Weight     Self, Time, Situation, Place  Normal Continent Weight: 182 lb 15.7 oz (83 kg) Height:  5\' 1"  (154.9 cm)  BEHAVIORAL SYMPTOMS/MOOD NEUROLOGICAL BOWEL NUTRITION STATUS      Continent    AMBULATORY STATUS COMMUNICATION OF NEEDS Skin   Extensive Assist Verbally Normal, Other (Comment)(surgical dressing (left side))                       Personal Care Assistance Level of Assistance  Bathing, Dressing Bathing Assistance: Maximum assistance   Dressing Assistance: Maximum assistance     Functional Limitations Info             SPECIAL CARE FACTORS FREQUENCY  PT (By licensed PT), OT (By licensed OT), Speech therapy     PT Frequency: 5xs per week OT Frequency: 5xs per week            Contractures      Additional Factors Info                  Current Medications (01/01/2020):  This is the current hospital active medication list Current Facility-Administered Medications  Medication Dose Route Frequency Provider Last Rate Last Admin  . acetaminophen (TYLENOL) tablet 650 mg  650 mg Oral Q8H Raulkar, Clide Deutscher, MD   650 mg at 01/01/20 0618  . albuterol (PROVENTIL) (2.5 MG/3ML) 0.083% nebulizer solution 2.5 mg  2.5 mg Inhalation Q4H PRN Angiulli, Lavon Paganini, PA-C      . allopurinol (ZYLOPRIM) tablet 300 mg  300 mg Oral Daily Cathlyn Parsons, PA-C   300 mg at 01/01/20 2229  . aspirin EC tablet 325 mg  325 mg Oral Q breakfast Cathlyn Parsons, PA-C   325 mg at 01/01/20 7989  . brimonidine (ALPHAGAN) 0.15 % ophthalmic solution 1 drop  1 drop Left Eye BID  Cathlyn Parsons, PA-C   1 drop at 01/01/20 2119  . calcium carbonate (TUMS - dosed in mg elemental calcium) chewable tablet 400 mg of elemental calcium  2 tablet Oral QID PRN Bary Leriche, PA-C   400 mg of elemental calcium at 12/18/19 1942  . cholecalciferol (VITAMIN D3) tablet 1,000 Units  1,000 Units Oral Daily Cathlyn Parsons, PA-C   1,000 Units at 12/31/19 0840  . cycloSPORINE (RESTASIS) 0.05 % ophthalmic emulsion 1 drop  1 drop Both Eyes BID Cathlyn Parsons, PA-C   1 drop at 01/01/20 4174  . dorzolamide (TRUSOPT) 2 % ophthalmic solution 1 drop  1 drop Both Eyes Daily Cathlyn Parsons, PA-C   1 drop at 01/01/20 0814  . enoxaparin (LOVENOX) injection 40 mg  40 mg Subcutaneous Q24H Meredith Staggers, MD   40 mg at 01/01/20 1233  . FLUoxetine (PROZAC) capsule 40 mg  40 mg Oral Daily Cathlyn Parsons, PA-C   40 mg at 01/01/20 4818  . HYDROcodone-acetaminophen (NORCO/VICODIN) 5-325 MG per tablet 1 tablet  1 tablet Oral Q4H PRN Meredith Staggers, MD   1 tablet at 01/01/20 [phone]  . latanoprost (XALATAN) 0.005 % ophthalmic solution 1 drop  1 drop Both Eyes QHS Cathlyn Parsons, PA-C   1 drop at 12/31/19 2127  . loperamide (IMODIUM) capsule 2 mg  2 mg Oral PRN Meredith Staggers, MD      . methocarbamol (ROBAXIN) tablet 500 mg  500 mg Oral Q6H PRN Meredith Staggers, MD   500 mg at 01/01/20 4970  . mometasone-formoterol (DULERA) 200-5 MCG/ACT inhaler 2 puff  2 puff Inhalation BID Cathlyn Parsons, PA-C   2 puff at 01/01/20 0859  . mycophenolate (MYFORTIC) EC tablet 180 mg  180 mg Oral BID Cathlyn Parsons, PA-C   180 mg at 01/01/20 2637  . ondansetron (ZOFRAN) tablet 4 mg  4 mg Oral Q6H PRN Angiulli, Lavon Paganini, PA-C       Or  . ondansetron [facility]) injection 4 mg  4 mg Intravenous Q6H PRN Angiulli, Lavon Paganini, PA-C      . pantoprazole (PROTONIX) EC tablet 40 mg  40 mg Oral BID Reesa Chew S, PA-C   40 mg at 01/01/20 0925  . predniSONE (DELTASONE) tablet 7.5 mg  7.5 mg Oral Q breakfast  Cathlyn Parsons, PA-C   7.5 mg at 01/01/20 8588  . sorbitol 70 % solution 30 mL  30 mL Oral Daily PRN Cathlyn Parsons, PA-C   30 mL at 12/20/19 1654  . tacrolimus (PROGRAF) capsule 0.5 mg  0.5 mg Oral QHS AngiulliLavon Paganini, PA-C   0.5 mg at 12/31/19 2028  . tacrolimus (PROGRAF) capsule 1 mg  1 mg Oral Daily Cathlyn Parsons, PA-C   1 mg at 01/01/20 5027  . vitamin B-12 (CYANOCOBALAMIN) tablet 100 mcg  100 mcg  Oral Daily Cathlyn Parsons, PA-C   100 mcg at 01/01/20 5027     Discharge Medications: Please see discharge summary for a list of discharge medications.  Relevant Imaging Results:  Relevant Lab Results:   Additional Information    Loralee Pacas, MSW, [place] Office (preferred): [phone] Cell: [phone] Fax: [phone]

## 2020-01-01 NOTE — Plan of Care (Signed)
  Problem: Consults Goal: RH GENERAL PATIENT EDUCATION Description: See Patient Education module for education specifics. Outcome: Progressing   Problem: RH BOWEL ELIMINATION Goal: RH STG MANAGE BOWEL WITH ASSISTANCE Description: STG Manage Bowel with Mod Assistance. Outcome: Progressing   Problem: RH BLADDER ELIMINATION Goal: RH STG MANAGE BLADDER WITH ASSISTANCE Description: STG Manage Bladder With Mod Assistance Outcome: Progressing   Problem: RH SKIN INTEGRITY Goal: RH STG SKIN FREE OF INFECTION/BREAKDOWN Description: No new breakdown with min assist  Outcome: Progressing Goal: RH STG ABLE TO PERFORM INCISION/WOUND CARE W/ASSISTANCE Description: STG Able To Perform Incision/Wound Care With Mod Assistance. Outcome: Progressing   Problem: RH SAFETY Goal: RH STG ADHERE TO SAFETY PRECAUTIONS W/ASSISTANCE/DEVICE Description: STG Adhere to Safety Precautions With Mod Assistance/Device. Outcome: Progressing   Problem: RH PAIN MANAGEMENT Goal: RH STG PAIN MANAGED AT OR BELOW PT'S PAIN GOAL Description: < 4 out of 10.  Outcome: Progressing

## 2020-01-02 ENCOUNTER — Inpatient Hospital Stay (HOSPITAL_COMMUNITY): Payer: Medicare Other | Admitting: Physical Therapy

## 2020-01-02 ENCOUNTER — Inpatient Hospital Stay (HOSPITAL_COMMUNITY): Payer: Medicare Other

## 2020-01-02 MED ORDER — LOPERAMIDE HCL 2 MG PO CAPS
2.0000 mg | ORAL_CAPSULE | Freq: Three times a day (TID) | ORAL | Status: DC
  Administered 2020-01-02 – 2020-01-03 (×4): 2 mg via ORAL
  Filled 2020-01-02 (×5): qty 1

## 2020-01-02 MED ORDER — SACCHAROMYCES BOULARDII 250 MG PO CAPS
250.0000 mg | ORAL_CAPSULE | Freq: Two times a day (BID) | ORAL | Status: DC
  Administered 2020-01-02 – 2020-01-05 (×6): 250 mg via ORAL
  Filled 2020-01-02 (×6): qty 1

## 2020-01-02 NOTE — Progress Notes (Signed)
Physical Therapy Session Note  Patient Details  Name: April [name] MRN: [id number] Date of Birth: February 19, 1956  Today's Date: 01/02/2020 PT Individual Time: 1032-1100 PT Individual Time Calculation (min): 28 min   Short Term Goals: Week 3:  PT Short Term Goal 1 (Week 3): =LTGs due to ELOS  Skilled Therapeutic Interventions/Progress Updates:   Pt received in recliner, agreeable to therapy and pain 3/10 on FACES scale. Pt requesting to get back to bed. Made multiple attempts at standing w/ min assist, but upon transitioning RUE to walker, both of pt's knees buckled and she sat back down. Discussed that this is likely 2/2 fear of falling and that therapist believes she can confidently perform task. Brief rest break recliner in recliner and discussed d/c plan. Pt reports she understands self-care and ADLs is what she will need the most help with, and that her husband cannot provide this level of care. Pt only agreeable to stand again if a 2nd person was there. RN available to help and pt performed stand pivot w/ PFRW and min-mod assist +2. Therapist believes pt would have been safe w/ just +1 assist however. Assist mostly needed for upright posture, walker management and verbal cues for sequencing and step placement. Mod assist sit>supine. Pt w/ increased SOB in supine, elevated HOB and provided w/ verbal and visual cues for pursed lip breathing. SOB resolved within a few minutes. Ended session in supine, all needs in reach.   Therapy Documentation Precautions:  Precautions Precautions: Fall Required Braces or Orthoses: Sling Restrictions Weight Bearing Restrictions: Yes LUE Weight Bearing: Weight bear through elbow only LLE Weight Bearing: Weight bearing as tolerated Other Position/Activity Restrictions: no L hip active abduction  Therapy/Group: Individual Therapy  Ananda Sitzer K Phylicia Mcgaugh 01/02/2020, 11:03 AM

## 2020-01-02 NOTE — Progress Notes (Signed)
Social Work Patient ID: April [name], female   DOB: 05-22-1956, 64 y.o.   MRN: [id number]   SW met with pt in room at bedside, and pt called her dtr [name] to discuss team updates. SW informed on change in pt d/c date to 01/05/2020. Pt reports plan is to d/c to home.Pt family believes that a discharge to home is feasible.   Pt would like to have a hospital bed. SW reviewed with pt services that can be put in place such as recommended therapies and CNA for personal care needs. SW indicated will follow-up about discharge recommendations. *SW provided pt with HHA list. SW will follow up about preference.   Loralee Pacas, MSW, [place] Office (preferred): [phone] Cell: [phone] Fax: [phone]

## 2020-01-02 NOTE — Progress Notes (Signed)
[place] PHYSICAL MEDICINE & REHABILITATION PROGRESS NOTE   Subjective/Complaints: Still having frequent, mushy stools. More alert. Pain an issue, can perseverate on it a times  ROS: Patient denies fever, rash, sore throat, blurred vision, nausea, vomiting, diarrhea, cough, shortness of breath or chest pain,  headache, or mood change.   Objective: No results found. Recent Labs    01/01/20 0530  WBC 7.6  HGB 8.9*  HCT 29.9*  PLT 289   Recent Labs    01/01/20 0530  NA 136  K 4.6  CL 103  CO2 25  GLUCOSE 93  BUN 23  CREATININE 0.88  CALCIUM 9.0    Intake/Output Summary (Last 24 hours) at 01/02/2020 1112 Last data filed at 01/02/2020 0720 Gross per 24 hour  Intake 510 ml  Output --  Net 510 ml     Physical Exam: Vital Signs Blood pressure 136/62, pulse 91, temperature 98.1 F (36.7 C), resp. rate 18, height 5\' 1"  (1.549 m), weight 83.9 kg, SpO2 98 %. Constitutional: No distress . Vital signs reviewed. Appears comfortable in recliner HEENT: EOMI, oral membranes moist Neck: supple Cardiovascular: RRR without murmur. No JVD    Respiratory: CTA Bilaterally without wheezes or rales. Normal effort    GI: BS +, non-tender, non-distended  Musculoskeletal:     Comments: ongoing pelvic and LB pain. Edema present but improved LUE Neurological: A&O x3, back to cognitive baseline. Motor: RUE: 4-/5 proximal to distal LLE: 1-4/5 limited proximally by pain LUE: proximally limited by splint, hand grip 2/5 limited by splint/swelling LLE: 2+/5 proximal to distal Sensation diminished to light touch b/le feet --no changes Skin:  Left hip cdi. Staples out Left upper extremity with sugar tong splint in place. Scattered ecchymoses as above on all 4 limbs, especially in UE's.--improving Psychiatric: pleasant   Assessment/Plan: 1. Functional deficits secondary to polytrauma which require 3+ hours per day of interdisciplinary therapy in a comprehensive inpatient rehab  setting.  Physiatrist is providing close team supervision and 24 hour management of active medical problems listed below.  Physiatrist and rehab team continue to assess barriers to discharge/monitor patient progress toward functional and medical goals  Care Tool:  Bathing    Body parts bathed by patient: Left arm, Chest, Abdomen, Front perineal area, Right upper leg, Left upper leg, Face, Right arm   Body parts bathed by helper: Buttocks, Right lower leg, Left lower leg Body parts n/a: Right lower leg, Left lower leg   Bathing assist Assist Level: Moderate Assistance - Patient 50 - 74%     Upper Body Dressing/Undressing Upper body dressing   What is the patient wearing?: Pull over shirt    Upper body assist Assist Level: Moderate Assistance - Patient 50 - 74%    Lower Body Dressing/Undressing Lower body dressing      What is the patient wearing?: Incontinence brief     Lower body assist Assist for lower body dressing: Maximal Assistance - Patient 25 - 49%     Toileting Toileting Toileting Activity did not occur (Clothing management and hygiene only): N/A (no void or bm)  Toileting assist Assist for toileting: Total Assistance - Patient < 25%     Transfers Chair/bed transfer  Transfers assist  Chair/bed transfer activity did not occur: Safety/medical concerns  Chair/bed transfer assist level: 2 Helpers     Locomotion Ambulation   Ambulation assist   Ambulation activity did not occur: Safety/medical concerns          Walk 10 feet activity  Assist  Walk 10 feet activity did not occur: Safety/medical concerns        Walk 50 feet activity   Assist Walk 50 feet with 2 turns activity did not occur: Safety/medical concerns         Walk 150 feet activity   Assist Walk 150 feet activity did not occur: Safety/medical concerns         Walk 10 feet on uneven surface  activity   Assist Walk 10 feet on uneven surfaces activity did not  occur: Safety/medical concerns         Wheelchair     Assist     Wheelchair activity did not occur: Safety/medical concerns         Wheelchair 50 feet with 2 turns activity    Assist    Wheelchair 50 feet with 2 turns activity did not occur: Safety/medical concerns       Wheelchair 150 feet activity     Assist  Wheelchair 150 feet activity did not occur: Safety/medical concerns       Blood pressure 136/62, pulse 91, temperature 98.1 F (36.7 C), resp. rate 18, height 5\' 1"  (1.549 m), weight 83.9 kg, SpO2 98 %.  Medical Problem List and Plan: 1.  Decreased functional mobility secondary to left displaced femoral neck fracture status post left total hip replacement anterior approach as well as severely left comminuted distal radius fracture status post ORIF 12/08/2019 as well as forearm laceration repair.  Weightbearing as tolerated through left elbow only and weightbearing as tolerated left lower extremity.  No left hip abduction. Most recent CT from 1/27 revealed fx's of left SPR/IPR and left sacrum.              -patient may  shower if left arm and hip wound covered            -remains limited by pain and mentation           2/9 pt is essentially at min assist currently. She is not likely to change in the near future given her pain limitations, body habitus, etc. If we maintain this level and family can provide needed assistance at home, we could shoot for Friday discharge. If not, we need to pursue SNF placement immediately.  2.  Antithrombotics: -DVT/anticoagulation: SCDs.  Dopplers negative   lovenox for dvt prophylaxis 40mg  daily             -antiplatelet therapy: Aspirin 325 mg daily 3. Pain Management: continue Hydrocodone and Robaxin as needed  -kpad/ice prn               -MS Contin stopped and hydrocodone decreased d/t lethargy  2/5 more alert today but pain likely will be limiting. Still not at cognitive baseline either.  2/9 pain level stable, still  inhibiting, pt self-limiting also 4. Mood: Prozac 40 mg daily  2/1: xanax 0.25mg  bid at 0700  and 1200 to help with mobility associated anxiety  2/5 stop xanax d/t persistent delays in processing             -antipsychotic agents: N/A 5. Neuropsych: This patient is capable of making decisions on her own behalf. 6. Skin/Wound Care: dc staples from hip today 7. Fluids/Electrolytes/Nutrition: intake poor  Continue to push po 8.  Polycystic kidney disease status post renal transplant 06/02/2012.  Baseline creatinine 1.02-1.29.  Continue Prograf, chronic prednisone as well as Myfortic.  Followed by Dr. Jimmy Footman  1/25: CMP stable except for low Na 131 and elevated Cr 1.21. Will repeat tomorrow to trend.    1/26 Sodium unchanged, BUN/Cr continue to drift up   -encourage fluids, hold lasix   -Na increased to 133  2/1 sodium down to 127   -dry by labs. Hold lasix  2/2 sodium holding at 127 today   -she is still -2600cc for admit, weight down from 1/31    -will not resume lasix at this time   -check bmet serially  2/8 Na+ back to 136   BUN/Cr almost normal   Continue to encourage PO   Avoid diuretics   Intake fair at best  2/9 recheck labs thursday 9.  Acute blood loss anemia.  Transfused 2 units packed red blood cells 12/11/2019.               Hgb holding at 9.8 on 1.22, 9.2 on 1/25  2/1: hgb 8.9   10.  Asthma.  Continue inhalers as directed. 11.  History of gout.  Allopurinol daily.  Monitor for any gout flareups 12.  Chronic diastolic congestive heart failure.  Lasix 40 mg daily. Monitor for sign/symptoms of fluid overload.    1/26-?trend, unsure about weights---need more together to assess for trend, she was -20cc for today by I/O's yesterday  -holding lasix at least for tomorrow d/t #8  1/29, 1/30 weight stable  1/31: increased weight, does appear to have increased work of breathing. Received Lasix 40 this morning. Will add additional 20mg  now.   2/9 weights stable   Filed  Weights   12/31/19 0541 01/01/20 0559 01/02/20 0338  Weight: 84.1 kg 83 kg 83.9 kg    13. Constipation: sorbitol, SSE   if needed  -scheduled senna-s  -large BM 1/30  2/3: constipated: scheduled Miralax.   2/8 multiple mushy bms    -holding miralax and senokot-s now that narcs substantially decr  2/9 scheduled imodium and probiotic LOS: 18 days A FACE TO [place] 01/02/2020, 11:12 AM

## 2020-01-02 NOTE — Progress Notes (Signed)
Occupational Therapy Session Note  Patient Details  Name: April [name] MRN: [id number] Date of Birth: Feb 08, 1956  Today's Date: 01/02/2020 OT Individual Time: 0830-0900 OT Individual Time Calculation (min): 30 min    Short Term Goals: Week 3:  OT Short Term Goal 1 (Week 3): STG= LTG d/t ELOS  Skilled Therapeutic Interventions/Progress Updates:    Pt received supine with 2/10 faces pain scale. Pt completed bed mobility to EOB with CGA. Pt required increased time for initiation and for pain rest breaks. Pt completed sit > stand with min A from EOB using PFRW. Stand pivot to the recliner with poor L LE pivoting, min A overall. Pt required immediate rest break with feet propped up and back reclined. After extended rest break pt was able to stand once more with min A for max A peri hygiene 2/2 bowel incontinence in brief. Pt was left sitting up in the recliner with all needs met, back fully reclined and heat pack placed. Pt also had a small skin tear on [name] that was occluded with gauze and tegaderm.   All vitals stable throughout session with no O2 desaturation.   Therapy Documentation Precautions:  Precautions Precautions: Fall Required Braces or Orthoses: Sling Restrictions Weight Bearing Restrictions: Yes LUE Weight Bearing: Weight bear through elbow only LLE Weight Bearing: Weight bearing as tolerated Other Position/Activity Restrictions: no L hip active abduction   Therapy/Group: Individual Therapy  Curtis Sites 01/02/2020, 6:49 AM

## 2020-01-02 NOTE — Patient Care Conference (Addendum)
Inpatient RehabilitationTeam Conference and Plan of Care Update Date: 01/02/2020   Time: 10:10 AM    Patient Name: April [name]      Medical Record Number: [id number]  Date of Birth: 1956-09-21 Sex: Female         Room/Bed: 4W09C/4W09C-01 Payor Info: Payor: [employer] MEDICARE / Plan: [facility] MEDICARE / Product Type: *No Product type* /    Admit Date/Time:  12/15/2019  4:47 PM  Primary Diagnosis:  Left displaced femoral neck fracture [facility])  Patient Active Problem List   Diagnosis Date Noted  . Fracture of left superior pubic ramus (HCC)   . Pain   . Anxiety state   . Left displaced femoral neck fracture ([place]) 12/15/2019  . Status post total hip replacement, left   . Post-op pain   . Acute blood loss anemia   . Polycystic kidney   . Closed left hip fracture ([place]) 12/07/2019  . Left wrist fracture 12/07/2019  . Medication management 09/22/2019  . Physical deconditioning 09/22/2019  . Hyponatremia 06/29/2019  . Acute respiratory failure with hypoxia ([place]) 04/19/2019  . Acute on chronic diastolic CHF (congestive heart failure) ([place]) 04/19/2019  . Bilateral closed proximal tibial fracture 12/21/2018  . Closed right ankle fracture 12/21/2018  . Fracture 12/21/2018  . Lower urinary tract infectious disease 10/03/2018  . Asthma, chronic, unspecified asthma severity, with acute exacerbation 10/03/2018  . SIRS (systemic inflammatory response syndrome) ([place]) 10/03/2018  . Nausea vomiting and diarrhea 10/02/2018  . Chronic diastolic CHF (congestive heart failure) ([place]) 10/01/2017  . Influenza B 10/01/2017  . Persistent asthma with status asthmaticus   . Pneumonia 07/15/2016  . Asthma, mild intermittent 07/15/2016  . GERD (gastroesophageal reflux disease) 07/15/2016  . Renal transplant recipient 07/15/2016  . Hyperlipidemia 07/15/2016  . Depression 07/15/2016  . Bronchitis, mucopurulent recurrent ([place]) 08/08/2014  . Chronic cough 07/24/2014  . End stage renal disease ([place])  03/23/2012  . 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 Alger Simons Social Worker Present: Lennart Pall, LCSW(Auria Barbra Sarks, [place]) Nurse Present: Ellison Carwin, LPN Case Manager: Karene Fry, RN PT Present: Burnard Bunting, PT OT Present: Laverle Hobby, OT SLP Present: Weston Anna, SLP PPS Coordinator present : Ileana Ladd, Burna Mortimer, SLP     Current Status/Progress Goal Weekly Team Focus  Bowel/Bladder   pt continent of bowel and bladder  times touilette and utilize urinal  timed toilette and use lazitives prn   Swallow/Nutrition/ Hydration             ADL's   Little progress/participation from last conference. CGA-min A bed mobility when able to do so. No transfers done this week...  Downgraded to min A overall  OOB tolerance, pain management/anxiety, d/c planning, ADLs   Mobility   mod assist bed mobility, can be min assist sit<>stand and stand pivot transfer, however can be up to max-total assist w/ pain and lethargy  min assist, w/c level  OOB tolerance, d/c planning, pain management strategies, functional strengthening/endurance   Communication             Safety/Cognition/ Behavioral Observations            Pain   pt states pain is 5-6/10 in pelvic area  pain <=3/10   asses pain q shift and prn medicate prn    Skin   ecchymosis to BUE, skin tyear to right arm, closed  incision ot right hip.  skin to remain free from breakdown and infection  assess q shift and PRN    Rehab Goals Patient on target to meet rehab goals: No Rehab Goals Revised: Currently discussing SNF vs home due to limited supports *See Care Plan and progress notes for long and short-term goals.     Barriers to Discharge  Current Status/Progress Possible Resolutions Date Resolved   Nursing                  PT                     OT                  SLP                SW Decreased caregiver support;Lack of/limited family support Pt husband not physically able to assist pt and will return to work in March 2021; pt dtr able to help PRN outside of her work hours; pt son is home intermittently.            Discharge Planning/Teaching Needs:  Possible SNF  For pt to continue to have family edu as recommended by therapy   Team Discussion: More alert, pain limiting, wounds okay, labs better.  RN cont B/B, diarrhea, immodium given, staples out pain, norco/robaxin for pain.  OT stand pivot min A.  PT min A transfers, up to max/tot, sit upright few minutes, less therapy is helpful.  Husband not able to do 24/7, family undecided on SNF placement.  Dtr is an OT, she works.   Revisions to Treatment Plan: N/A     Medical Summary Current Status: improved mentation. pain an issue still. recently loose stools--meds being adjusted. Weekly Focus/Goal: pain mgt, activity tolerance, Na+ normalization    Barriers to Discharge Comments: pain, medication tolerance Possible Resolutions to Barriers: extra help at home. balancing mentation/pain   Continued Need for Acute Rehabilitation Level of Care: The patient requires daily medical management by a physician with specialized training in physical medicine and rehabilitation for the following reasons: Direction of a multidisciplinary physical rehabilitation program to maximize functional independence : Yes Medical management of patient stability for increased activity during participation in an intensive rehabilitation regime.: Yes Analysis of laboratory values and/or radiology reports with any subsequent need for medication adjustment and/or medical intervention. : Yes   I attest that I was present, lead the team conference, and concur with the assessment and plan of the team.   Jodell Cipro M 01/03/2020, 9:47 AM   Team conference was held via web/ teleconference due to [place] -  19

## 2020-01-03 ENCOUNTER — Inpatient Hospital Stay (HOSPITAL_COMMUNITY): Payer: Medicare Other | Admitting: Physical Therapy

## 2020-01-03 ENCOUNTER — Inpatient Hospital Stay (HOSPITAL_COMMUNITY): Payer: Medicare Other

## 2020-01-03 MED ORDER — METHOCARBAMOL 500 MG PO TABS
500.0000 mg | ORAL_TABLET | Freq: Once | ORAL | Status: DC
  Filled 2020-01-03: qty 1

## 2020-01-03 NOTE — Discharge Summary (Signed)
Physician Discharge Summary  Patient ID: April [name] MRN: [id number] DOB/AGE: 1956/03/30 64 y.o.  Admit date: 12/15/2019 Discharge date: 01/05/2020  Discharge Diagnoses:  Principal Problem:   Left displaced femoral neck fracture [employer]) Active Problems:   Fracture of left superior pubic ramus (HCC)   Pain   Anxiety state Left comminuted distal radius fracture Forearm laceration DVT prophylaxis Pain management Mood stabilization Acute blood loss anemia Polycystic kidney disease status post renal transplant 06/02/2012 History of asthma Gout Chronic diastolic congestive heart failure Constipation  Discharged Condition: Stable  Significant Diagnostic Studies: DG Chest 1 View  Result Date: 12/09/2019 CLINICAL DATA:  64 year old female with history of leukocytosis. EXAM: CHEST  1 VIEW COMPARISON:  Chest x-ray 06/29/2019. FINDINGS: Elevation of the left hemidiaphragm. Chronic linear scarring in the left lung base, similar to prior studies. No acute consolidative airspace disease. No pleural effusions. No evidence of pulmonary edema. Mild cardiomegaly. Upper mediastinal contours are within normal limits. IMPRESSION: 1. Low lung volumes without radiographic evidence of acute cardiopulmonary disease. Electronically Signed   By: Vinnie Langton M.D.   On: 12/09/2019 14:31   DG Lumbar Spine 2-3 Views  Result Date: 12/19/2019 CLINICAL DATA:  Low back pain. EXAM: LUMBAR SPINE - 2-3 VIEW COMPARISON:  No prior. FINDINGS: Diffuse osteopenia. Diffuse degenerative changes lumbar spine with scoliosis concave right. No acute bony abnormality identified. No evidence of fracture. Surgical clips in the pelvis. Total left hip replacement. IMPRESSION: Diffuse osteopenia degenerative change. Scoliosis lumbar spine concave right. No acute bony abnormality. Electronically Signed   By: Marcello Moores  Register   On: 12/19/2019 14:22   DG Pelvis 1-2 Views  Result Date: 12/19/2019 CLINICAL DATA:  Low back pain,  fracture of left superior pubic ramus EXAM: PELVIS - 1-2 VIEW COMPARISON:  12/08/2019 FINDINGS: Generalized osteopenia. Non displaced fracture of the left superior pubic ramus. Left hip arthroplasty. No pelvic bone lesions are seen. IMPRESSION: Nondisplaced fracture of the left superior pubic ramus. Electronically Signed   By: Kathreen Devoid   On: 12/19/2019 14:21   DG Wrist 2 Views Left  Result Date: 12/07/2019 CLINICAL DATA:  Post reduction EXAM: LEFT WRIST - 2 VIEW COMPARISON:  12/06/2018 FINDINGS: Interval casting material limits bone detail. Acute markedly comminuted intra-articular distal radius fracture with residual 1/3 shaft diameter of dorsal displacement of distal fracture fragment, though improved alignment compared to previous. Decreased angulation and impaction. Acute comminuted distal ulna fracture, also with decreased angulation and impaction. IMPRESSION: Acute comminuted intra-articular distal radius fracture with decreased impaction and displacement. Acute comminuted distal ulna fracture, also with decreased impaction and angulation Electronically Signed   By: Donavan Foil M.D.   On: 12/07/2019 16:44   DG Wrist Complete Left  Result Date: 12/07/2019 CLINICAL DATA:  Fall.  Left wrist pain and swelling.  Deformity. EXAM: LEFT WRIST - COMPLETE 3+ VIEW COMPARISON:  None. FINDINGS: There is a comminuted, impacted and dorsally displaced fracture of the distal radius, with dorsal displacement approximately 1.5 cm. Apparent intra-articular component of the fracture. The dorsal articular surface is also angulated dorsally approximately 20 degrees. There is an associated fracture of the distal ulna, mildly comminuted, also somewhat impacted and dorsally angulated. The carpus displaces with the distal radial fracture component. There is no dislocation. No other fractures. Skeletal structures are demineralized. There is surrounding soft tissue swelling. IMPRESSION: 1. Comminuted, dorsally displaced and  dorsally angulated fracture of the distal left radius with an intra-articular component, associated with a mildly comminuted, dorsally angulated fracture of the distal ulna.  No dislocation. Electronically Signed   By: Lajean Manes M.D.   On: 12/07/2019 13:51   CT PELVIS WO CONTRAST  Result Date: 12/20/2019 CLINICAL DATA:  Follow-up x-ray to evaluate possible pelvic fracture. EXAM: CT PELVIS WITHOUT CONTRAST TECHNIQUE: Multidetector CT imaging of the pelvis was performed following the standard protocol without intravenous contrast. 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 Transplant kidney over the right lower quadrant/upper pelvis unchanged. Bladder is normal. Bowel: Visualized large and small bowel are normal. Appendix is normal. Vascular/Lymphatic: Normal. Reproductive:  Normal. Other: Small umbilical hernia containing only peritoneal fat unchanged. Musculoskeletal: Evidence of patient's recent left hip arthroplasty intact. Subtle fracture involving the left side of the sacrum adjacent the sacroiliac joint. There is a mildly displaced acute fracture of the midportion of the left inferior pubic ramus. There is also a minimally displaced fracture of the left superior pubic ramus extending towards the symphysis. Fragmentation of the left greater trochanter which may be part patient's recent acute left hip fracture versus acute re-injury. Soft tissue swelling over the left hip. IMPRESSION: 1. Minimally displaced acute fractures of the left superior and inferior pubic rami. 2. Acute minimally displaced fracture of the left sacrum adjacent the sacroiliac joint. 3. Fragmentation of the left greater trochanter which may be part of patient's known recent acute left femoral neck fracture versus acute re-injury. Recent left hip arthroplasty otherwise intact. Electronically Signed   By: Marin Olp M.D.   On:  12/20/2019 14:13   Pelvis Portable  Result Date: 12/08/2019 CLINICAL DATA:  Status post hip replacement EXAM: PORTABLE PELVIS 1-2 VIEWS COMPARISON:  12/07/2019 FINDINGS: Interval left hip replacement with normal alignment. Apparent cortical fracture deformity at the inferior aspect of the greater trochanter. Gas in the soft tissues along with cutaneous staples. Possible acute left superior pubic ramus fracture. IMPRESSION: 1. Interval left hip replacement with intact hardware and normal alignment. Expected postsurgical changes overlying left hip. 2. Cortex deformity at the inferior aspect of the greater trochanter on the left, suspect for fracture. Possible acute left superior pubic ramus fracture as well. Electronically Signed   By: Donavan Foil M.D.   On: 12/08/2019 18:54   DG C-Arm 1-60 Min-No Report  Result Date: 12/08/2019 Fluoroscopy was utilized by the requesting physician.  No radiographic interpretation.   DG HIP OPERATIVE UNILAT WITH PELVIS LEFT  Result Date: 12/08/2019 CLINICAL DATA:  Total left hip replacement. EXAM: OPERATIVE LEFT HIP (WITH PELVIS IF PERFORMED) 2 VIEWS TECHNIQUE: Fluoroscopic spot image(s) were submitted for interpretation post-operatively. COMPARISON:  12/07/2019. FINDINGS: Total left hip replacement. Hardware intact. Anatomic alignment. Pelvic calcifications consistent phleboliths. IMPRESSION: Total left hip replacement with anatomic alignment. Electronically Signed   By: Marcello Moores  Register   On: 12/08/2019 16:13   DG HIP UNILAT WITH PELVIS 2-3 VIEWS LEFT  Result Date: 12/21/2019 CLINICAL DATA:  Left hip pain. EXAM: DG HIP (WITH OR WITHOUT PELVIS) 2-3V LEFT COMPARISON:  CT from yesterday FINDINGS: Total left hip arthroplasty with cerclage wire. Unchanged alignment of a greater trochanter base fracture. Unchanged alignment of a left superior and inferior pubic ramus fracture. Clips over the right hemipelvis. Osteopenia. IMPRESSION: Unchanged alignment of left obturator  ring and greater trochanter fractures. Electronically Signed   By: Monte Fantasia M.D.   On: 12/21/2019 10:07   DG Hip Unilat With Pelvis 2-3 Views Left  Result Date: 12/07/2019 CLINICAL DATA:  Fall.  Left hip pain. EXAM: DG HIP (WITH OR WITHOUT PELVIS) 2-3V  LEFT COMPARISON:  None. FINDINGS: Nondisplaced, non comminuted fracture across the mid left femoral neck. Mild valgus angulation. No other fractures. Hip joints, SI joints and symphysis pubis are normally aligned. Skeletal structures are demineralized. Soft tissues are unremarkable. IMPRESSION: 1. Nondisplaced, non comminuted fracture of the left mid femoral neck with mild valgus angulation. No dislocation. Electronically Signed   By: Lajean Manes M.D.   On: 12/07/2019 13:52   VAS [place] LOWER EXTREMITY VENOUS (DVT)  Result Date: 12/18/2019  Lower Venous Study Indications: Swelling.  Risk Factors: Surgery left hip. Limitations: Body habitus and poor ultrasound/tissue interface. Comparison Study: 10/03/2018- negative bilateral lower extremity venous duplex Performing Technologist: Maudry Mayhew MHA, RDMS, RVT, RDCS  Examination Guidelines: A complete evaluation includes B-mode imaging, spectral Doppler, color Doppler, and power Doppler as needed of all accessible portions of each vessel. Bilateral testing is considered an integral part of a complete examination. Limited examinations for reoccurring indications may be performed as noted.  +---------+---------------+---------+-----------+----------+--------------+ RIGHT    CompressibilityPhasicitySpontaneityPropertiesThrombus Aging +---------+---------------+---------+-----------+----------+--------------+ CFV      Full           Yes      Yes                                 +---------+---------------+---------+-----------+----------+--------------+ SFJ      Full                                                         +---------+---------------+---------+-----------+----------+--------------+ FV Prox  Full                                                        +---------+---------------+---------+-----------+----------+--------------+ FV Mid   Full                                                        +---------+---------------+---------+-----------+----------+--------------+ FV DistalFull                                                        +---------+---------------+---------+-----------+----------+--------------+ PFV      Full                                                        +---------+---------------+---------+-----------+----------+--------------+ POP      Full           Yes      Yes                                 +---------+---------------+---------+-----------+----------+--------------+ PTV  Full                                                        +---------+---------------+---------+-----------+----------+--------------+ PERO     Full                                                        +---------+---------------+---------+-----------+----------+--------------+   +---------+---------------+---------+-----------+----------+--------------+ LEFT     CompressibilityPhasicitySpontaneityPropertiesThrombus Aging +---------+---------------+---------+-----------+----------+--------------+ CFV      Full           Yes      Yes                                 +---------+---------------+---------+-----------+----------+--------------+ SFJ      Full                                                        +---------+---------------+---------+-----------+----------+--------------+ FV Prox  Full                                                        +---------+---------------+---------+-----------+----------+--------------+ FV Mid   Full                                                         +---------+---------------+---------+-----------+----------+--------------+ FV DistalFull                                                        +---------+---------------+---------+-----------+----------+--------------+ PFV      Full                                                        +---------+---------------+---------+-----------+----------+--------------+ POP      Full           Yes      Yes                                 +---------+---------------+---------+-----------+----------+--------------+ PTV      Full                                                        +---------+---------------+---------+-----------+----------+--------------+  PERO     Full                                                        +---------+---------------+---------+-----------+----------+--------------+     Summary: Right: There is no evidence of deep vein thrombosis in the lower extremity. No cystic structure found in the popliteal fossa. Left: There is no evidence of deep vein thrombosis in the lower extremity. No cystic structure found in the popliteal fossa.  *See table(s) above for measurements and observations. Electronically signed by Monica Martinez MD on 12/18/2019 at 4:33:06 PM.    Final     Labs:  Basic Metabolic Panel: Recent Labs  Lab 12/29/19 0745 01/01/20 0530 01/04/20 0559  NA 132* 136 136  K 4.9 4.6 4.0  CL 96* 103 99  CO2 28 25 25   GLUCOSE 92 93 92  BUN 45* 23 17  CREATININE 1.29* 0.88 0.86  CALCIUM 9.1 9.0 9.0    CBC: Recent Labs  Lab 01/01/20 0530 01/04/20 0559  WBC 7.6 7.8  HGB 8.9* 8.4*  HCT 29.9* 29.0*  MCV 96.8 99.0  PLT 289 255    CBG: No results for input(s): GLUCAP in the last 168 hours.  Family history.  Mother with hypertension as well as father with CAD.  Father with polycystic kidney disease as well as brother.  Denies any diabetes mellitus colon cancer or esophageal cancer  Brief HPI:   April [name] is a 64 y.o.  right-handed female with history of polycystic kidney disease, chronic diastolic congestive heart failure, status post renal transplant 06/02/2012 with creatinine baseline 1.02-1.29 maintained on Prograf chronic prednisone as well as Myfortic by Dr. Jimmy Footman, asthma, remote tobacco abuse, hyperlipidemia.  Patient lives with spouse and family.  Two-level home bed and bath on main level with ramped entrance.  Independent with rolling walker prior to admission.  Presented 12/07/2019 after a fall in the kitchen without loss of consciousness landing on her left side.  X-rays and imaging revealed left displaced femoral neck fracture as well as severely comminuted distal radius fracture on the left.  Underwent left total hip replacement anterior approach as well as ORIF left radius fracture 12/08/2019 per Dr. Berenice Primas as well as repair of forearm laceration.  Hospital course complicated by pain weightbearing as tolerated through elbow only of left upper extremity weightbearing as tolerated left lower extremity.  No left hip abduction.  Acute blood loss anemia 2 units packed red blood cells transfused 12/11/2019.  Maintain on aspirin 325 mg daily for DVT prophylaxis.  Renal function remained stable latest creatinine 0.91.  Patient was admitted for a comprehensive rehab program   Hospital Course: KARRON GOENS was admitted to rehab 12/15/2019 for inpatient therapies to consist of PT, ST and OT at least three hours five days a week. Past admission physiatrist, therapy team and rehab RN have worked together to provide customized collaborative inpatient rehab.  Pertaining to patient's left displaced femoral neck fracture had undergone left total hip replacement anterior approach as well as severely left comminuted distal radius fracture with ORIF 12/08/2019.  Patient would follow-up orthopedic services.  Weightbearing as tolerated through left elbow and weightbearing as tolerated left lower extremity.  A recent CT 12/20/2019  revealed fractures of left SPR/IPR and left sacrum reviewed by orthopedic services continued weightbearing as tolerated.  Maintained on aspirin 3 and 25 mg daily for DVT prophylaxis as well as Lovenox added with venous Doppler studies negative.  Pain managed with use of hydrocodone as needed as well as Robaxin as needed for muscle spasms.  Patient had been on MS Contin for pain control discontinued due to lethargy.  Mood stabilization with Prozac scheduled and emotional support provided.  Patient did have a history of polycystic kidney disease renal transplant 06/02/2012 followed by outpatient renal services she continued on Prograf chronic prednisone as well as Myfortic as advised and latest creatinine 0.88.  Acute blood loss anemia stable latest hemoglobin 8.9 no bleeding episodes.  She did have a history of asthma she remained on inhalers as directed no shortness of breath.  History of gout no flareups noted maintained on Zyloprim.  She exhibited no signs of fluid overload with history of chronic diastolic congestive heart failure she had been on Lasix held due to some hyponatremia and plan to resume back as needed.  Bouts of constipation resolved with laxative assistance.   Blood pressures were monitored on TID basis and controlled   She is continent of bowel and bladder.  She/ has made gains during rehab stay and is attending therapies  She/ will continue to receive follow up therapies after discharge  Rehab course: During patient's stay in rehab weekly team conferences were held to monitor patient's progress, set goals and discuss barriers to discharge. At admission, patient required moderate assist ambulate 1 foot with left platform walker, moderate assist stand pivot transfers, max assist sit to supine.  Moderate assist upper body bathing total assist lower body bathing moderate assist upper body dressing total assist lower body dressing  Physical exam.  Blood pressure 138/63 pulse 96  temperature 97.7 respirations 20 oxygen saturation 99% room air Constitutional alert and oriented no distress H EENT Head.  Normocephalic and atraumatic Eyes.  Pupils round and reactive to light no discharge without nystagmus Neck supple nontender no tracheal deviation no thyromegaly Respiratory effort normal no stridor no respiratory distress Cardiac regular rate rhythm without any extra sounds or murmur heard GI.  Soft nontender positive bowel sounds without rebound Neurological.  Alert and oriented follows commands Motor.  Right upper extremity 4- out of 5 proximal to distal Left lower extremity 3+ 4- out of 5 proximal to distal Left upper extremity proximal limited by splint handgrip 2 out of 5 Sensation diminished to light touch lateral feet Skin.  Left hip incision with dressing clean dry and intact left upper extremity with sugar tong splint in place  /She  has had improvement in activity tolerance, balance, postural control as well as ability to compensate for deficits./She has had improvement in functional use RUE/LUE  and RLE/LLE as well as improvement in awareness.  Working with energy conservation techniques.  Needed some encouragement at times to participate.  Made multiple attempts at standing with minimal assist but up on transitioning right upper extremity walker both knees would weaken she would need frequent rest breaks.  Patient understands self-care and ADLs that she will need the most help from her husband who is working with family teaching.  Perform stand pivot with platform rolling walker min to mod assist +2.  Moderate assist sit to supine.  Patient needing some increased time for sit to supine.  Completed bed mobility edge of bed with contact-guard assist for ADLs.  Required increased time for initiation.  Completed sit to stand with minimal assist edge of bed platform rolling walker for basic  hygiene.  Full family teaching was completed it was discussed at length the amount  of physical assistance patient needed for discharge with family.  Plan was discharged to home       Disposition: Discharge to home    Diet: Regular  Special Instructions: Weightbearing as tolerated through left elbow only.  Weightbearing as tolerated left lower extremity  Medications at discharge 1.  Tylenol as needed 2.  Allopurinol 300 mg p.o. daily 3.  Aspirin 3 and 25 mg p.o. daily 4.  Alphagan ophthalmic solution 0.15% 1 drop left eye twice daily 5.  Tums 1 tablet 4 times daily as needed 6.  Vitamin D3 1000 units p.o. daily 7.restasis ophthalmic solution 0.05% 1 drop both eyes twice daily 8.  Trusopt ophthalmic solution 2% 1 drop both eyes daily 9.  Prozac 40 mg p.o. daily 10.  Hydrocodone 1 tablet every 4 hours as needed pain 11.  xalatan ophthalmic solution 0.05% 1 drop both eyes nightly 12.  Imodium 2 mg 3 times daily as needed 13.  Robaxin 500 mg every 6 as needed muscle spasms 14.  Dulera 2 puffs twice daily 15.  Myfortic 180 mg p.o. twice daily 16.  Protonix 40 mg p.o. twice daily 17.  Prednisone 7.5 mg p.o. with breakfast 18.  Florastor 250 mg p.o. twice daily 19.  Prograf 0.5 mg nightly and 1 mg daily 20.  Vitamin B12 1000 mcg p.o. daily  Follow up PCP for resuming Lasix as sodium levels improve  Follow-up Information    Raulkar, Clide Deutscher, MD Follow up.   Specialty: Physical Medicine and Rehabilitation Why: Office to call for appointment Contact information: [address] [place] [address] [phone]        Dorna Leitz, MD Follow up.   Specialty: Orthopedic Surgery Why: Call for appointment Contact information: [place] [address] [phone]           Signed: Cathlyn Parsons 01/05/2020, 5:28 AM

## 2020-01-03 NOTE — Progress Notes (Addendum)
Social Work Patient ID: April [name], female   DOB: June 02, 1956, 64 y.o.   MRN: [id number]   SW met with pt in room to follow-up on preferred [facility]. Reports preference is [facility] since she has had in the past. SW and pt discussed transport home. Pt requests ambulance transport to home. Pt reminded to contact insurance to discuss [facility] coverage if she has further questions/concerns. SW will follow up with pt to inform if hospital bed recommended.   SW spoke with Fabiola Backer at [facility] [phone]) about HHPT/OT/SW/CNA referral. Will follow after speaking with branch.   SW arranged ambulance transport to home with [facility] [phone]) for Friday, Feb 12 at 11am.  Per medical team, pt will need hospital bed. Hospital bed order sent to [facility] [phone].  *SW received confirmation referral was accepted by [facility]. SW met with pt and pt husband in room to inform on above. Pt husband requests for hospital bed before pt arrives, between 8am-10am.  Pt will need med necessity form completed.  Loralee Pacas, [place] Office (preferred): [phone] Cell: [phone] Fax: [phone]

## 2020-01-03 NOTE — Progress Notes (Signed)
[place] PHYSICAL MEDICINE & REHABILITATION PROGRESS NOTE   Subjective/Complaints: In severe pain this morning, shaking. Says that pain feels like sharp spasms in her hip and going down her left leg. Says Robaxin usually helps her and she has one at 5am this morning but it did not help today.  Nurse at bedside is giving her Norco now.   ROS: Patient denies fever, rash, sore throat, blurred vision, nausea, vomiting, diarrhea, cough, shortness of breath or chest pain,  headache, or mood change.   Objective: No results found. Recent Labs    01/01/20 0530  WBC 7.6  HGB 8.9*  HCT 29.9*  PLT 289   Recent Labs    01/01/20 0530  NA 136  K 4.6  CL 103  CO2 25  GLUCOSE 93  BUN 23  CREATININE 0.88  CALCIUM 9.0    Intake/Output Summary (Last 24 hours) at 01/03/2020 1109 Last data filed at 01/03/2020 0841 Gross per 24 hour  Intake 550 ml  Output 750 ml  Net -200 ml     Physical Exam: Vital Signs Blood pressure 132/60, pulse 97, temperature 98.1 F (36.7 C), resp. rate 18, height 5\' 1"  (1.549 m), weight 83.9 kg, SpO2 93 %. Constitutional: No distress . Vital signs reviewed. In severe pain, laying flat on bed.  HEENT: EOMI, oral membranes moist Neck: supple Cardiovascular: RRR without murmur. No JVD    Respiratory: CTA Bilaterally without wheezes or rales. Normal effort    GI: BS +, non-tender, non-distended  Musculoskeletal:     Comments: ongoing pelvic and LB pain. Edema present but improved LUE Neurological: A&O x3, back to cognitive baseline. Motor: RUE: 4-/5 proximal to distal LLE: 1-4/5 limited proximally by pain LUE: proximally limited by splint, hand grip 2/5 limited by splint/swelling LLE: 2+/5 proximal to distal Sensation diminished to light touch b/le feet --no changes Skin:  Left hip cdi. Staples out Left upper extremity with sugar tong splint in place. Scattered ecchymoses as above on all 4 limbs, especially in UE's.--improving Psychiatric:  pleasant   Assessment/Plan: 1. Functional deficits secondary to polytrauma which require 3+ hours per day of interdisciplinary therapy in a comprehensive inpatient rehab setting.  Physiatrist is providing close team supervision and 24 hour management of active medical problems listed below.  Physiatrist and rehab team continue to assess barriers to discharge/monitor patient progress toward functional and medical goals  Care Tool:  Bathing    Body parts bathed by patient: Left arm, Chest, Abdomen, Front perineal area, Right upper leg, Left upper leg, Face, Right arm   Body parts bathed by helper: Buttocks, Right lower leg, Left lower leg Body parts n/a: Right lower leg, Left lower leg   Bathing assist Assist Level: Moderate Assistance - Patient 50 - 74%     Upper Body Dressing/Undressing Upper body dressing   What is the patient wearing?: Pull over shirt    Upper body assist Assist Level: Moderate Assistance - Patient 50 - 74%    Lower Body Dressing/Undressing Lower body dressing      What is the patient wearing?: Incontinence brief     Lower body assist Assist for lower body dressing: Maximal Assistance - Patient 25 - 49%     Toileting Toileting Toileting Activity did not occur (Clothing management and hygiene only): N/A (no void or bm)  Toileting assist Assist for toileting: Total Assistance - Patient < 25%     Transfers Chair/bed transfer  Transfers assist  Chair/bed transfer activity did not occur: Safety/medical concerns  Chair/bed transfer assist level: 2 Helpers     Locomotion Ambulation   Ambulation assist   Ambulation activity did not occur: Safety/medical concerns          Walk 10 feet activity   Assist  Walk 10 feet activity did not occur: Safety/medical concerns        Walk 50 feet activity   Assist Walk 50 feet with 2 turns activity did not occur: Safety/medical concerns         Walk 150 feet activity   Assist Walk 150  feet activity did not occur: Safety/medical concerns         Walk 10 feet on uneven surface  activity   Assist Walk 10 feet on uneven surfaces activity did not occur: Safety/medical concerns         Wheelchair     Assist     Wheelchair activity did not occur: Safety/medical concerns         Wheelchair 50 feet with 2 turns activity    Assist    Wheelchair 50 feet with 2 turns activity did not occur: Safety/medical concerns       Wheelchair 150 feet activity     Assist  Wheelchair 150 feet activity did not occur: Safety/medical concerns       Blood pressure 132/60, pulse 97, temperature 98.1 F (36.7 C), resp. rate 18, height 5\' 1"  (1.549 m), weight 83.9 kg, SpO2 93 %.  Medical Problem List and Plan: 1.  Decreased functional mobility secondary to left displaced femoral neck fracture status post left total hip replacement anterior approach as well as severely left comminuted distal radius fracture status post ORIF 12/08/2019 as well as forearm laceration repair.  Weightbearing as tolerated through left elbow only and weightbearing as tolerated left lower extremity.  No left hip abduction. Most recent CT from 1/27 revealed fx's of left SPR/IPR and left sacrum.              -patient may  shower if left arm and hip wound covered            -remains limited by pain and mentation           2/9 pt is essentially at min assist currently. She is not likely to change in the near future given her pain limitations, body habitus, etc. If we maintain this level and family can provide needed assistance at home, we could shoot for Friday discharge. If not, we need to pursue SNF placement immediately.   2/10: Pain significantly limiting patient's participation in therapy- she was unable to participate in OT today.  2.  Antithrombotics: -DVT/anticoagulation: SCDs.  Dopplers negative   lovenox for dvt prophylaxis 40mg  daily             -antiplatelet therapy: Aspirin 325 mg  daily 3. Pain Management: continue Hydrocodone and Robaxin as needed  -kpad/ice prn               -MS Contin stopped and hydrocodone decreased d/t lethargy  2/5 more alert today but pain likely will be limiting. Still not at cognitive baseline either.  2/9 pain level stable, still inhibiting, pt self-limiting also  2/10: Increase in pain this morning- spasms in right leg and lower back. Just received Norco which should help. Also added 1x dose of 500mg  Robaxin which usually helps patient with her spasms. Advised that if spasms remain uncontrolled after receiving both these medications, we can increase Robaxin dose to 750mg  QID.  4. Mood:  Prozac 40 mg daily  2/1: xanax 0.25mg  bid at 0700  and 1200 to help with mobility associated anxiety  2/5 stop xanax d/t persistent delays in processing             -antipsychotic agents: N/A 5. Neuropsych: This patient is capable of making decisions on her own behalf. 6. Skin/Wound Care: dc staples from hip today 7. Fluids/Electrolytes/Nutrition: intake poor  Continue to push po 8.  Polycystic kidney disease status post renal transplant 06/02/2012.  Baseline creatinine 1.02-1.29.  Continue Prograf, chronic prednisone as well as Myfortic.  Followed by Dr. Jimmy Footman            1/25: CMP stable except for low Na 131 and elevated Cr 1.21. Will repeat tomorrow to trend.    1/26 Sodium unchanged, BUN/Cr continue to drift up   -encourage fluids, hold lasix   -Na increased to 133  2/1 sodium down to 127   -dry by labs. Hold lasix  2/2 sodium holding at 127 today   -she is still -2600cc for admit, weight down from 1/31    -will not resume lasix at this time   -check bmet serially  2/8 Na+ back to 136   BUN/Cr almost normal   Continue to encourage PO   Avoid diuretics   Intake fair at best  2/9 recheck labs thursday 9.  Acute blood loss anemia.  Transfused 2 units packed red blood cells 12/11/2019.               Hgb holding at 9.8 on 1.22, 9.2 on 1/25  2/1:  hgb 8.9   10.  Asthma.  Continue inhalers as directed. 11.  History of gout.  Allopurinol daily.  Monitor for any gout flareups 12.  Chronic diastolic congestive heart failure.  Lasix 40 mg daily. Monitor for sign/symptoms of fluid overload.    1/26-?trend, unsure about weights---need more together to assess for trend, she was -20cc for today by I/O's yesterday  -holding lasix at least for tomorrow d/t #8  1/29, 1/30 weight stable  1/31: increased weight, does appear to have increased work of breathing. Received Lasix 40 this morning. Will add additional 20mg  now.   2/9 weights stable  2/10: weight stable   Filed Weights   12/31/19 0541 01/01/20 0559 01/02/20 0338  Weight: 84.1 kg 83 kg 83.9 kg    13. Constipation: sorbitol, SSE   if needed  -scheduled senna-s  -large BM 1/30  2/3: constipated: scheduled Miralax.   2/8 multiple mushy bms    -holding miralax and senokot-s now that narcs substantially decr  2/9 scheduled imodium and probiotic  2/10: under much better control yesterday and today.  LOS: 19 days A FACE TO FACE EVALUATION WAS PERFORMED  Harmonii Karle P Daveion Robar 01/03/2020, 11:09 AM

## 2020-01-03 NOTE — Progress Notes (Signed)
Physical Therapy Session Note  Patient Details  Name: April [name] MRN: [id number] Date of Birth: May 27, 1956  Today's Date: 01/03/2020 PT Individual Time: 1030-1045 PT Individual Time Calculation (min): 15 min  and Today's Date: 01/03/2020 PT Missed Time: 15 Minutes Missed Time Reason: Pain  Short Term Goals: Week 3:  PT Short Term Goal 1 (Week 3): =LTGs due to ELOS  Skilled Therapeutic Interventions/Progress Updates:   Pt in supine and reports significant increase in pain today, 4/10 on FACES. Reports she may have hurt something in her L foot yesterday, pt unable to tolerate MMT on foot 2/2 pain and it's painful w/ all PROM. Provided w/ ice for swelling and pain - MD already aware of status. Pt politely declining OOB activity, but she did agree to sit up to 20 deg of HOB elevation at end of session. Missed 15 min of skilled PT 2/2 pain.   Therapy Documentation Precautions:  Precautions Precautions: Fall Required Braces or Orthoses: Sling Restrictions Weight Bearing Restrictions: Yes LUE Weight Bearing: Weight bear through elbow only LLE Weight Bearing: Weight bearing as tolerated Other Position/Activity Restrictions: no L hip active abduction General: PT Amount of Missed Time (min): 15 Minutes PT Missed Treatment Reason: Pain  Therapy/Group: Individual Therapy  Sharisa Toves K Chante Mayson 01/03/2020, 11:59 AM

## 2020-01-03 NOTE — Discharge Instructions (Signed)
Inpatient Rehab Discharge Instructions  ERRICKA FALKNER Discharge date and time: No discharge date for patient encounter.   Activities/Precautions/ Functional Status: Activity: Weightbearing as tolerated through left elbow only.  Weightbearing as tolerated left lower extremity Diet: regular diet Wound Care: keep wound clean and dry Functional status:  ___ No restrictions     ___ Walk up steps independently ___ 24/7 supervision/assistance   ___ Walk up steps with assistance ___ Intermittent supervision/assistance  ___ Bathe/dress independently ___ Walk with walker     _x__ Bathe/dress with assistance ___ Walk Independently    ___ Shower independently ___ Walk with assistance    ___ Shower with assistance ___ No alcohol     ___ Return to work/school ________    COMMUNITY REFERRALS UPON DISCHARGE:    Home Health:   PT     OT        SNA    SW                Agency:[facility] Phone: [phone]    Medical Equipment/Items Ordered: Hospital Bed                                                 Agency/Supplier: [facility] [phone]     Special Instructions: No driving smoking or alcohol   My questions have been answered and I understand these instructions. I will adhere to these goals and the provided educational materials after my discharge from the hospital.  Patient/Caregiver Signature _______________________________ Date __________  Clinician Signature _______________________________________ Date __________  Please bring this form and your medication list with you to all your follow-up doctor's appointments.

## 2020-01-03 NOTE — Progress Notes (Signed)
Physical Therapy Discharge Summary  Patient Details  Name: April [name] MRN: [id number] Date of Birth: 17-Sep-1956  Today's Date: 01/04/2020 PT Individual Time: 1020-1045 PT Individual Time Calculation (min): 25 min   Pt received in supine, politely declining any OOB activity, states she already got to [facility] this morning. Reviewed d/c information and recommendations. Pt in agreement that she can only safely transfer w/ her daughter or a skilled caregiver at this time. Discussed directing her care w/ bed mobility for using bed pan and voiding in urinal. Pt performed R/L rolling w/ CGA w/ increased time 2/2 pain. (pain 6/10 on FACES scale, premedicated). Discussed HEP for BLEs including glut squeezes, quad sets (LLE only), heel slides, assisted SLRs, and ankle pumps. Instructed to perform 2x5 reps and gradually build up to 3x10 reps. Provided w/ written hand out and pt verbalized understanding. Pt believes her son could help her w/ active assist exercises. Pt performed all other exercises safely and w/ minimal increase in pain/spasms. Ended session in supine, all needs in reach.   Patient has met 2 of 4 long term goals. Pt made minimal progress towards LTGs while admitted to CIR 2/2 ongoing pain and behaviors consistent w/ cental pain sensitization (feelings of helplessness regarding pain, ruminating on pain, pain at rest, and pain in anticipation of movement). She tolerates upright sitting for seconds to 1-2 min at a time 2/2 pain and pain is only relieved in a near supine or very reclined position. She would benefit from a lower level of therapies to slowly progress her functional level and tolerance to upright mobility.   Patient to discharge at a wheelchair level Total Assist for w/c propulsion, min assist for stand pivot transfers. Patient's care partner (husband) is unable to provide the necessary physical assistance at discharge. Pt's husband w/ recent hip replacement himself, unable to provide  physical assist. Pt's daughter, [name], is a licensed OT who has provided hands on assist for many stand pivot transfers. [name] has additionally witnessed and assisted w/ transfers requiring max-total assist to safely pivot to bed 2/2 pt's pain. Both daughter and husband are aware of care pt requires 2/2 inability to tolerate OOB. Daughter and husband also verbalize understanding that pt requires 24/7 care that husband is unable to provide.   Reasons goals not met: see note above  Recommendation:  Patient will benefit from ongoing skilled PT services in home health setting to continue to advance safe functional mobility, address ongoing impairments in LLE strengthening/ROM, endurance, OOB tolerance, and balance, and minimize fall risk.  Equipment: L PFRW, pt already has w/c  Reasons for discharge: lack of progress toward goals and discharge from hospital  Patient/family agrees with progress made and goals achieved: Yes  PT Discharge Precautions/Restrictions Restrictions Weight Bearing Restrictions: Yes LUE Weight Bearing: Weight bear through elbow only LLE Weight Bearing: Weight bearing as tolerated Vital Signs Therapy Vitals Temp: 98.2 F (36.8 C) Pulse Rate: 91 Resp: 20 BP: 128/65 Patient Position (if appropriate): Lying Oxygen Therapy SpO2: 98 % O2 Device: Room Air Pain Pain Assessment Pain Scale: 0-10 Pain Score: 0-No pain Faces Pain Scale: No hurt Vision/Perception  Perception Perception: Within Functional Limits Praxis Praxis: Intact  Sensation Sensation Light Touch: Appears Intact Proprioception: Appears Intact Coordination Gross Motor Movements are Fluid and Coordinated: No Fine Motor Movements are Fluid and Coordinated: No Coordination and Movement Description: limited by pain Motor  Motor Motor: Other (comment) Motor - Discharge Observations: BLE spasms in hamstrings, pt reports "burning/shooting" sensation down posterior legs  w/ spasms  Mobility Bed  Mobility Bed Mobility: Supine to Sit;Sit to Supine;Rolling Right;Rolling Left Rolling Right: Contact Guard/Touching assist Rolling Left: Contact Guard/Touching assist Supine to Sit: Minimal Assistance - Patient > 75% Sit to Supine: Moderate Assistance - Patient 50-74% Transfers Transfers: Sit to Stand;Stand to Sit;Stand Pivot Transfers Sit to Stand: Minimal Assistance - Patient > 75% Stand to Sit: Minimal Assistance - Patient > 75% Stand Pivot Transfers: Minimal Assistance - Patient > 75% Stand Pivot Transfer Details: Manual facilitation for placement;Verbal cues for safe use of DME/AE;Verbal cues for precautions/safety;Manual facilitation for weight shifting;Verbal cues for technique;Tactile cues for posture;Tactile cues for weight shifting Transfer (Assistive device): Left platform walker Locomotion  Gait Ambulation: No Gait Gait: No Stairs / Additional Locomotion Stairs: No Wheelchair Mobility Wheelchair Mobility: No  Trunk/Postural Assessment  Cervical Assessment Cervical Assessment: Exceptions to WFL(forward head) Thoracic Assessment Thoracic Assessment: Exceptions to WFL(rounded shoulders) Lumbar Assessment Lumbar Assessment: Exceptions to WFL(posterior pelvic tilt) Postural Control Postural Control: Deficits on evaluation(delayed/insufficient, posterior lean bias 2/2 pain in WB through pelvis)  Balance Balance Balance Assessed: Yes Static Sitting Balance Static Sitting - Level of Assistance: 5: Stand by assistance Dynamic Sitting Balance Dynamic Sitting - Level of Assistance: 4: Min assist Static Standing Balance Static Standing - Level of Assistance: 4: Min assist Dynamic Standing Balance Dynamic Standing - Level of Assistance: 4: Min assist Extremity Assessment  RLE Assessment RLE Assessment: Exceptions to WFL(does not tolerate formal testing 2/2 pain, pt minimally moves extremity against gravity in supine, but able to functionally utilize extremity to stand w/  min assist) LLE Assessment LLE Assessment: Exceptions to WFL(does not tolerate formal testing 2/2 pain, pt minimally moves extremity against gravity in supine, but able to functionally utilize extremity to stand w/ min assist)    Caterina Racine K Jospeh Mangel 01/03/2020, 5:50 PM

## 2020-01-03 NOTE — Progress Notes (Signed)
Occupational Therapy Session Note  Patient Details  Name: DARLENE BROZOWSKI MRN: [id number] Date of Birth: 1956-06-17  Today's Date: 01/03/2020 OT Individual Time: 1712-7871 OT Individual Time Calculation (min): 15 min    Short Term Goals: Week 3:  OT Short Term Goal 1 (Week 3): STG= LTG d/t ELOS  Skilled Therapeutic Interventions/Progress Updates:    Upon entry to room pt immediately reporting "very high" pain in her back and leg. Pt reporting she "must have done something to her foot and shoulder yesterday by getting OOB". Attempted to distract and redirect but pt perseverative on pain. Pt discussing pain medication taken that morning and she had also had not eaten breakfast. Encouraged pt to eat breakfast to ensure she does not become nausea with pain medication on an empty stomach. Pt agreeable and bed was flattened for pt to scoot up in bed, with bed in high trend position. Pt was increased to 30 degrees and began eating but then with an extra degree of increase pt reported she could no longer tolerate upright sitting and needed to be flat. Pt was lowered and after a rest break re-increased to 30 degrees. Pt was left supine eating breakfast.   Therapy Documentation Precautions:  Precautions Precautions: Fall Required Braces or Orthoses: Sling Restrictions Weight Bearing Restrictions: Yes LUE Weight Bearing: Weight bear through elbow only LLE Weight Bearing: Weight bearing as tolerated Other Position/Activity Restrictions: no L hip active abduction   Therapy/Group: Individual Therapy  Curtis Sites 01/03/2020, 6:56 AM

## 2020-01-04 ENCOUNTER — Inpatient Hospital Stay (HOSPITAL_COMMUNITY): Payer: Medicare Other | Admitting: Physical Therapy

## 2020-01-04 ENCOUNTER — Inpatient Hospital Stay (HOSPITAL_COMMUNITY): Payer: Medicare Other

## 2020-01-04 LAB — BASIC METABOLIC PANEL
Anion gap: 12 (ref 5–15)
BUN: 17 mg/dL (ref 8–23)
CO2: 25 mmol/L (ref 22–32)
Calcium: 9 mg/dL (ref 8.9–10.3)
Chloride: 99 mmol/L (ref 98–111)
Creatinine, Ser: 0.86 mg/dL (ref 0.44–1.00)
GFR calc Af Amer: >60 mL/min (ref >60)
GFR calc non Af Amer: >60 mL/min (ref >60)
Glucose, Bld: 92 mg/dL (ref 70–99)
Potassium: 4 mmol/L (ref 3.5–5.1)
Sodium: 136 mmol/L (ref 135–145)

## 2020-01-04 LAB — CBC
HCT: 29 % — ABNORMAL LOW (ref 36.0–46.0)
Hemoglobin: 8.4 g/dL — ABNORMAL LOW (ref 12.0–15.0)
MCH: 28.7 pg (ref 26.0–34.0)
MCHC: 29 g/dL — ABNORMAL LOW (ref 30.0–36.0)
MCV: 99 fL (ref 80.0–100.0)
Platelets: 255 10*3/uL (ref 150–400)
RBC: 2.93 MIL/uL — ABNORMAL LOW (ref 3.87–5.11)
RDW: 20.2 % — ABNORMAL HIGH (ref 11.5–15.5)
WBC: 7.8 10*3/uL (ref 4.0–10.5)
nRBC: 0 % (ref 0.0–0.2)

## 2020-01-04 MED ORDER — CYCLOSPORINE 0.05 % OP EMUL: 1.0000 [drp] | Freq: Two times a day (BID) | OPHTHALMIC | 0 refills | Status: AC

## 2020-01-04 MED ORDER — VITAMIN D 25 MCG (1000 UNIT) PO TABS: 1000.0000 [IU] | ORAL_TABLET | Freq: Every day | ORAL | 0 refills | Status: AC

## 2020-01-04 MED ORDER — SYMBICORT 160-4.5 MCG/ACT IN AERO: 2.0000 | INHALATION_SPRAY | Freq: Two times a day (BID) | RESPIRATORY_TRACT | 12 refills | Status: DC

## 2020-01-04 MED ORDER — ALLOPURINOL 300 MG PO TABS: 300.0000 mg | ORAL_TABLET | Freq: Every day | ORAL | 0 refills | Status: DC

## 2020-01-04 MED ORDER — LOPERAMIDE HCL 2 MG PO CAPS: 2.0000 mg | ORAL_CAPSULE | ORAL | Status: DC | PRN

## 2020-01-04 MED ORDER — ALBUTEROL SULFATE HFA 108 (90 BASE) MCG/ACT IN AERS: 2.0000 | INHALATION_SPRAY | RESPIRATORY_TRACT | 0 refills | Status: AC | PRN

## 2020-01-04 MED ORDER — CALCIUM CARBONATE ANTACID 500 MG PO CHEW: 2.0000 | CHEWABLE_TABLET | Freq: Four times a day (QID) | ORAL | Status: DC | PRN

## 2020-01-04 MED ORDER — HYDROCODONE-ACETAMINOPHEN 5-325 MG PO TABS: 1.0000 | ORAL_TABLET | ORAL | 0 refills | Status: DC | PRN

## 2020-01-04 MED ORDER — METHOCARBAMOL 500 MG PO TABS
500.0000 mg | ORAL_TABLET | Freq: Four times a day (QID) | ORAL | Status: DC
  Administered 2020-01-04 – 2020-01-05 (×5): 500 mg via ORAL
  Filled 2020-01-04 (×5): qty 1

## 2020-01-04 MED ORDER — PANTOPRAZOLE SODIUM 40 MG PO TBEC: 40.0000 mg | DELAYED_RELEASE_TABLET | Freq: Two times a day (BID) | ORAL | 0 refills | Status: DC

## 2020-01-04 MED ORDER — METHOCARBAMOL 500 MG PO TABS: 500.0000 mg | ORAL_TABLET | Freq: Four times a day (QID) | ORAL | 0 refills | Status: DC

## 2020-01-04 MED ORDER — ASPIRIN 325 MG PO TBEC: 325.0000 mg | DELAYED_RELEASE_TABLET | Freq: Every day | ORAL | 0 refills | Status: DC

## 2020-01-04 MED ORDER — FLUOXETINE HCL 40 MG PO CAPS: 40.0000 mg | ORAL_CAPSULE | Freq: Every day | ORAL | 3 refills | Status: AC

## 2020-01-04 MED ORDER — MAGNESIUM OXIDE 400 MG PO TABS: 400.0000 mg | ORAL_TABLET | Freq: Two times a day (BID) | ORAL | 0 refills | Status: DC

## 2020-01-04 MED ORDER — SACCHAROMYCES BOULARDII 250 MG PO CAPS: 250.0000 mg | ORAL_CAPSULE | Freq: Two times a day (BID) | ORAL | 0 refills | Status: DC

## 2020-01-04 NOTE — Progress Notes (Signed)
Occupational Therapy Discharge Summary  Patient Details  Name: April [name] MRN: [id number] Date of Birth: 03/08/1956  Today's Date: 01/04/2020 OT Individual Time: 6468-0321 OT Individual Time Calculation (min): 30 min    Patient has met 4 of 8 long term goals due to improved balance, functional use of  LEFT upper and LEFT lower extremity and improved awareness.  Patient to discharge at [facility] Max Assist level. Pt is able ot complete mobility with overall MIN A but is unwilling/unable to tolerate EOB/w/c level ADls d/t pain. Patient's care partner is independent to provide the necessary physical assistance at discharge.  Pt has had limited participation throughout rehab stay d/t anxiety and pain. Pt's husband is aware to only provide A to place bed pan, bed mobility and sitting EOB. Educated pt on use of compression socks to A managing BP as needed if feeling dizzy sitting EOB. Pt's daughter is an OT and has participated in hands on transfers with pt. Daughter is only pt recommended to transfer with pt at this time and all in agreement with this decision.   Reasons goals not met: participation has been limited d/t pain and anxiety  Recommendation:  Patient will benefit from ongoing skilled OT services in home health setting to continue to advance functional skills in the area of BADL and Reduce care partner burden.  Equipment: No equipment provided  Reasons for discharge: discharge from hospital  Patient/family agrees with progress made and goals achieved: Yes  OT Discharge Precautions/Restrictions  Precautions Precautions: Fall Restrictions Weight Bearing Restrictions: Yes LUE Weight Bearing: Weight bear through elbow only LLE Weight Bearing: Weight bearing as tolerated Other Position/Activity Restrictions: no L hip active abduction General   Vital Signs   Pain   ADL ADL Eating: Set up Grooming: Setup Where Assessed-Grooming: Edge of bed Upper Body Bathing:  Supervision/safety Where Assessed-Upper Body Bathing: Edge of bed Lower Body Bathing: Maximal assistance Where Assessed-Lower Body Bathing: Edge of bed Upper Body Dressing: Moderate assistance Where Assessed-Upper Body Dressing: Edge of bed Lower Body Dressing: (refused) Where Assessed-Lower Body Dressing: Edge of bed Toileting: Dependent Where Assessed-Toileting: Bedside Commode Toilet Transfer: Minimal assistance Toilet Transfer Method: Stand pivot Toilet Transfer Equipment: Bedside commode Vision Baseline Vision/History: Wears glasses Wears Glasses: At all times Patient Visual Report: No change from baseline Vision Assessment?: No apparent visual deficits Perception  Perception: Within Functional Limits Praxis Praxis: Intact Cognition Overall Cognitive Status: Within Functional Limits for tasks assessed Arousal/Alertness: Awake/alert Orientation Level: Oriented X4 Sensation Sensation Light Touch: Impaired by gross assessment Proprioception: Appears Intact Coordination Gross Motor Movements are Fluid and Coordinated: No Fine Motor Movements are Fluid and Coordinated: No Coordination and Movement Description: limited by pain Motor  Motor Motor - Discharge Observations: BLE spasms in hamstrings, pt reports "burning/shooting" sensation down posterior legs w/ spasms Mobility  Bed Mobility Rolling Right: Contact Guard/Touching assist Rolling Left: Contact Guard/Touching assist Supine to Sit: Contact Guard/Touching assist Transfers Sit to Stand: Minimal Assistance - Patient > 75% Stand to Sit: Minimal Assistance - Patient > 75%  Trunk/Postural Assessment  Cervical Assessment Cervical Assessment: (forward head) Thoracic Assessment Thoracic Assessment: (rounded hsoulders) Lumbar Assessment Lumbar Assessment: (post pelvic tilt) Postural Control Postural Control: Deficits on evaluation  Balance Static Sitting Balance Static Sitting - Level of Assistance: 5: Stand by  assistance Dynamic Sitting Balance Dynamic Sitting - Balance Support: Feet unsupported;Right upper extremity supported Dynamic Sitting - Level of Assistance: 5: Stand by assistance Static Standing Balance Static Standing - Balance Support: During functional activity Static  Standing - Level of Assistance: 4: Min assist Extremity/Trunk Assessment RUE Assessment RUE Assessment: Within Functional Limits LUE Assessment General Strength Comments: wrist FX, no formal assessment, edematous digits improved since eval   Tonny Branch 01/04/2020, 12:21 PM

## 2020-01-04 NOTE — Progress Notes (Signed)
[place] PHYSICAL MEDICINE & REHABILITATION PROGRESS NOTE   Subjective/Complaints: In severe pain this morning, shaking. Says that pain feels like sharp spasms in her hip and going down her left leg. Says Robaxin usually helps her and she has one at 5am this morning but it did not help today.  Nurse at bedside is giving her Norco now.   ROS: Patient denies fever, rash, sore throat, blurred vision, nausea, vomiting, diarrhea, cough, shortness of breath or chest pain,  headache, or mood change.   Objective: No results found. Recent Labs    01/04/20 0559  WBC 7.8  HGB 8.4*  HCT 29.0*  PLT 255   Recent Labs    01/04/20 0559  NA 136  K 4.0  CL 99  CO2 25  GLUCOSE 92  BUN 17  CREATININE 0.86  CALCIUM 9.0    Intake/Output Summary (Last 24 hours) at 01/04/2020 1011 Last data filed at 01/04/2020 0830 Gross per 24 hour  Intake 700 ml  Output 1800 ml  Net -1100 ml     Physical Exam: Vital Signs Blood pressure 138/65, pulse 96, temperature 98.7 F (37.1 C), temperature source Oral, resp. rate 20, height 5\' 1"  (1.549 m), weight 80.1 kg, SpO2 96 %. Constitutional: No distress . Vital signs reviewed. In severe pain, laying flat on bed.  HEENT: EOMI, oral membranes moist Neck: supple Cardiovascular: RRR without murmur. No JVD    Respiratory: CTA Bilaterally without wheezes or rales. Normal effort    GI: BS +, non-tender, non-distended  Musculoskeletal:     Comments: ongoing pelvic and LB pain. Edema present but improved LUE Neurological: A&O x3, back to cognitive baseline. Motor: RUE: 4-/5 proximal to distal LLE: 1-4/5 limited proximally by pain LUE: proximally limited by splint, hand grip 2/5 limited by splint/swelling LLE: 2+/5 proximal to distal Sensation diminished to light touch b/le feet --no changes Skin:  Left hip cdi. Staples out Left upper extremity with sugar tong splint in place. Scattered ecchymoses as above on all 4 limbs, especially in  UE's.--improving Psychiatric: pleasant   Assessment/Plan: 1. Functional deficits secondary to polytrauma which require 3+ hours per day of interdisciplinary therapy in a comprehensive inpatient rehab setting.  Physiatrist is providing close team supervision and 24 hour management of active medical problems listed below.  Physiatrist and rehab team continue to assess barriers to discharge/monitor patient progress toward functional and medical goals  Care Tool:  Bathing    Body parts bathed by patient: Left arm, Chest, Abdomen, Front perineal area, Right upper leg, Left upper leg, Face, Right arm   Body parts bathed by helper: Buttocks, Right lower leg, Left lower leg Body parts n/a: Right lower leg, Left lower leg   Bathing assist Assist Level: Moderate Assistance - Patient 50 - 74%     Upper Body Dressing/Undressing Upper body dressing   What is the patient wearing?: Pull over shirt    Upper body assist Assist Level: Moderate Assistance - Patient 50 - 74%    Lower Body Dressing/Undressing Lower body dressing      What is the patient wearing?: Incontinence brief     Lower body assist Assist for lower body dressing: Maximal Assistance - Patient 25 - 49%     Toileting Toileting Toileting Activity did not occur (Clothing management and hygiene only): N/A (no void or bm)  Toileting assist Assist for toileting: Total Assistance - Patient < 25%     Transfers Chair/bed transfer  Transfers assist  Chair/bed transfer activity did not  occur: Safety/medical concerns  Chair/bed transfer assist level: 2 Helpers     Locomotion Ambulation   Ambulation assist   Ambulation activity did not occur: Safety/medical concerns          Walk 10 feet activity   Assist  Walk 10 feet activity did not occur: Safety/medical concerns        Walk 50 feet activity   Assist Walk 50 feet with 2 turns activity did not occur: Safety/medical concerns         Walk 150 feet  activity   Assist Walk 150 feet activity did not occur: Safety/medical concerns         Walk 10 feet on uneven surface  activity   Assist Walk 10 feet on uneven surfaces activity did not occur: Safety/medical concerns         Wheelchair     Assist     Wheelchair activity did not occur: Safety/medical concerns         Wheelchair 50 feet with 2 turns activity    Assist    Wheelchair 50 feet with 2 turns activity did not occur: Safety/medical concerns       Wheelchair 150 feet activity     Assist  Wheelchair 150 feet activity did not occur: Safety/medical concerns       Blood pressure 138/65, pulse 96, temperature 98.7 F (37.1 C), temperature source Oral, resp. rate 20, height 5\' 1"  (1.549 m), weight 80.1 kg, SpO2 96 %.  Medical Problem List and Plan: 1.  Decreased functional mobility secondary to left displaced femoral neck fracture status post left total hip replacement anterior approach as well as severely left comminuted distal radius fracture status post ORIF 12/08/2019 as well as forearm laceration repair.  Weightbearing as tolerated through left elbow only and weightbearing as tolerated left lower extremity.  No left hip abduction. Most recent CT from 1/27 revealed fx's of left SPR/IPR and left sacrum.              -patient may  shower if left arm and hip wound covered            -remains limited by pain and mentation           2/11 family feels they can manage her at home given current functional levels. Probably best that she goes about things less intensively given her pain  -dc home tomorrow 2.  Antithrombotics: -DVT/anticoagulation: SCDs.  Dopplers negative   lovenox for dvt prophylaxis 40mg  daily             -antiplatelet therapy: Aspirin 325 mg daily 3. Pain Management: continue Hydrocodone and Robaxin as needed  -kpad/ice prn               -MS Contin stopped and hydrocodone decreased d/t lethargy  2/5 more alert today but pain likely  will be limiting. Still not at cognitive baseline either.  2/9 pain level stable, still inhibiting, pt self-limiting also  2/10: Increase in pain this morning- spasms in right leg and lower back. Just received Norco which should help. Also added 1x dose of 500mg  Robaxin which usually helps patient with her spasms. Advised that if spasms remain uncontrolled after receiving both these medications, we can increase Robaxin dose to 750mg  QID.   2/11-continue with hydrocodone prn   -will go ahead and schedule robaxin 500mg  tid and see how she does. Did well with one dose yesterday 4. Mood: Prozac 40 mg daily  2/1: xanax 0.25mg  bid at  0700  and 1200 to help with mobility associated anxiety  2/5 stop xanax d/t persistent delays in processing             -antipsychotic agents: N/A 5. Neuropsych: This patient is capable of making decisions on her own behalf. 6. Skin/Wound Care: dc staples from hip today 7. Fluids/Electrolytes/Nutrition: intake poor  Continue to push po 8.  Polycystic kidney disease status post renal transplant 06/02/2012.  Baseline creatinine 1.02-1.29.  Continue Prograf, chronic prednisone as well as Myfortic.  Followed by Dr. Jimmy Footman            1/25: CMP stable except for low Na 131 and elevated Cr 1.21. Will repeat tomorrow to trend.    1/26 Sodium unchanged, BUN/Cr continue to drift up   -encourage fluids, hold lasix   -Na increased to 133  2/1 sodium down to 127   -dry by labs. Hold lasix  2/2 sodium holding at 127 today   -she is still -2600cc for admit, weight down from 1/31    -will not resume lasix at this time   -check bmet serially  2/8 Na+ back to 136   BUN/Cr almost normal   Continue to encourage PO   Avoid diuretics   Intake fair at best  2/11 sodium holding at 136 today 9.  Acute blood loss anemia.  Transfused 2 units packed red blood cells 12/11/2019.               Hgb holding at 9.8 on 1.22, 9.2 on 1/25  2/1: hgb 8.9   10.  Asthma.  Continue inhalers as  directed. 11.  History of gout.  Allopurinol daily.  Monitor for any gout flareups 12.  Chronic diastolic congestive heart failure.  Lasix 40 mg daily. Monitor for sign/symptoms of fluid overload.    1/26-?trend, unsure about weights---need more together to assess for trend, she was -20cc for today by I/O's yesterday  -holding lasix at least for tomorrow d/t #8  1/29, 1/30 weight stable  1/31: increased weight, does appear to have increased work of breathing. Received Lasix 40 this morning. Will add additional 20mg  now.   2/9 weights stable  2/11: weight stable   Filed Weights   01/01/20 0559 01/02/20 0338 01/04/20 0500  Weight: 83 kg 83.9 kg 80.1 kg    13. Constipation: sorbitol, SSE   if needed  -scheduled senna-s  -large BM 1/30  2/3: constipated: scheduled Miralax.   2/8 multiple mushy bms    -holding miralax and senokot-s now that narcs substantially decr  2/9 scheduled imodium and probiotic  2/11 bowels have improved, make imodium prn  LOS: 20 days A FACE TO FACE EVALUATION WAS PERFORMED  Meredith Staggers 01/04/2020, 10:11 AM

## 2020-01-04 NOTE — Progress Notes (Signed)
Occupational Therapy Session Note  Patient Details  Name: April [name] MRN: [id number] Date of Birth: Apr 13, 1956  Today's Date: 01/04/2020 OT Individual Time: 5329-9242 OT Individual Time Calculation (min): 30 min    Short Term Goals: Week 1:  OT Short Term Goal 1 (Week 1): Pt will sit to stand wiht MOD A of 1 in prep for CM OT Short Term Goal 1 - Progress (Week 1): Met OT Short Term Goal 2 (Week 1): Pt will transfer to w/c or BSC with MAX A of 1 to decrease BOC OT Short Term Goal 2 - Progress (Week 1): Not met OT Short Term Goal 3 (Week 1): Pt will thread 1LE into pants with AE PRN OT Short Term Goal 3 - Progress (Week 1): Not met OT Short Term Goal 4 (Week 1): Pt will groom with set up OT Short Term Goal 4 - Progress (Week 1): Met  Skilled Therapeutic Interventions/Progress Updates:    1:1. Pt received in bed reporting need to toilet, and begrudgingly agreeable to getting up to [facility]. Pt reporting pain "better than yesterday" but doesn't rate. Pt supine>sitting EOB with CGA. Pt stand pivot transfer with VC for PFRW management with MIN A to BSc. Pt unable to void on BSC d/t "pain". Pt requires VC for reciprocal scooting to [facility]. During transfer back to bed pt has total LOB anteriorly and requires total A to intervene/prevent fall. Discussed with pt skilled help for all transfers (daughter is OT/HHOT/PT). Bedpan recommended for home use. Exited session with pt seated in bed, exit alarm on and call light inr each  Therapy Documentation Precautions:  Precautions Precautions: Fall Required Braces or Orthoses: Sling Restrictions Weight Bearing Restrictions: Yes LUE Weight Bearing: Weight bear through elbow only LLE Weight Bearing: Weight bearing as tolerated Other Position/Activity Restrictions: no L hip active abduction General:   Vital Signs: Oxygen Therapy SpO2: 96 % O2 Device: Room Air Pain: Pain Assessment Pain Scale: 0-10 Pain Score: 4  Pain Type: Acute pain Pain  Location: Leg Pain Orientation: Left Pain Descriptors / Indicators: Aching Pain Frequency: Intermittent Pain Onset: On-going Pain Intervention(s): Medication (See eMAR) ADL: ADL Grooming: Minimal assistance Where Assessed-Grooming: Edge of bed Upper Body Bathing: Minimal assistance Where Assessed-Upper Body Bathing: Edge of bed Lower Body Bathing: Maximal assistance Where Assessed-Lower Body Bathing: Edge of bed Upper Body Dressing: Supervision/safety Where Assessed-Upper Body Dressing: Edge of bed Lower Body Dressing: (2 helper sit to stand) Where Assessed-Lower Body Dressing: Edge of bed Vision   Perception    Praxis   Exercises:   Other Treatments:     Therapy/Group: Individual Therapy  Tonny Branch 01/04/2020, 9:44 AM

## 2020-01-05 ENCOUNTER — Inpatient Hospital Stay (HOSPITAL_COMMUNITY): Payer: Medicare Other

## 2020-01-05 ENCOUNTER — Telehealth: Payer: Self-pay

## 2020-01-05 NOTE — Progress Notes (Signed)
Social Work Patient ID: April [name], female   DOB: Aug 18, 1956, 65 y.o.   MRN: [id number]   Per medical team, pt down for X-Ray of arm at ortho request. SW called [facility] ([phone]) to change ambulance pick up time to 12pm. SW called pt husband April [name] [phone]) to inform on change. He reports hospital bed is currently being set up in home. D/c packet left at front desk with Network engineer.    Loralee Pacas, MSW, [place] Office (preferred): [phone] Cell: [phone] Fax: [phone]

## 2020-01-05 NOTE — Progress Notes (Signed)
[place] PHYSICAL MEDICINE & REHABILITATION PROGRESS NOTE   Subjective/Complaints: No new complaints. Anxious about going home today but happy to be going. Pain an ongoing issue  ROS: Patient denies fever, rash, sore throat, blurred vision, nausea, vomiting, diarrhea, cough, shortness of breath or chest pain,  headache, or mood change.     Objective: No results found. Recent Labs    01/04/20 0559  WBC 7.8  HGB 8.4*  HCT 29.0*  PLT 255   Recent Labs    01/04/20 0559  NA 136  K 4.0  CL 99  CO2 25  GLUCOSE 92  BUN 17  CREATININE 0.86  CALCIUM 9.0    Intake/Output Summary (Last 24 hours) at 01/05/2020 1047 Last data filed at 01/05/2020 0816 Gross per 24 hour  Intake 350 ml  Output 500 ml  Net -150 ml     Physical Exam: Vital Signs Blood pressure 136/65, pulse 95, temperature 98.5 F (36.9 C), resp. rate 20, height 5\' 1"  (1.549 m), weight 80.1 kg, SpO2 98 %. Constitutional: No distress . Vital signs reviewed. HEENT: EOMI, oral membranes moist Neck: supple Cardiovascular: RRR without murmur. No JVD    Respiratory: CTA Bilaterally without wheezes or rales. Normal effort    GI: BS +, non-tender, non-distended  Musculoskeletal:     Comments: pelvic and LLE pain Neurological: A&O x3, back to cognitive baseline. Motor: RUE: 4-/5 proximal to distal LLE: 1-4/5 limited proximally by pain LUE: proximally limited by splint, hand grip 2/5 limited by splint/swelling LLE: 2+/5 proximal to distal Sensation diminished to light touch b/le feet --chronic changes Skin:  Left hip CDI. Left upper extremity with sugar tong splint in place. Scattered ecchymoses as above on all 4 limbs are resolving Psychiatric: pleasant a little anxoius   Assessment/Plan: 1. Functional deficits secondary to polytrauma which require 3+ hours per day of interdisciplinary therapy in a comprehensive inpatient rehab setting.  Physiatrist is providing close team supervision and 24 hour management  of active medical problems listed below.  Physiatrist and rehab team continue to assess barriers to discharge/monitor patient progress toward functional and medical goals  Care Tool:  Bathing  Bathing activity did not occur: Refused Body parts bathed by patient: Left arm, Chest, Abdomen, Front perineal area, Right upper leg, Left upper leg, Face, Right arm   Body parts bathed by helper: Buttocks, Right lower leg, Left lower leg Body parts n/a: Right lower leg, Left lower leg   Bathing assist Assist Level: Moderate Assistance - Patient 50 - 74%     Upper Body Dressing/Undressing Upper body dressing Upper body dressing/undressing activity did not occur (including orthotics): Refused What is the patient wearing?: Pull over shirt    Upper body assist Assist Level: Moderate Assistance - Patient 50 - 74%    Lower Body Dressing/Undressing Lower body dressing      What is the patient wearing?: Incontinence brief     Lower body assist Assist for lower body dressing: Total Assistance - Patient < 25%     Toileting Toileting Toileting Activity did not occur (Clothing management and hygiene only): N/A (no void or bm)  Toileting assist Assist for toileting: Total Assistance - Patient < 25%     Transfers Chair/bed transfer  Transfers assist  Chair/bed transfer activity did not occur: Safety/medical concerns  Chair/bed transfer assist level: Minimal Assistance - Patient > 75%     Locomotion Ambulation   Ambulation assist   Ambulation activity did not occur: Safety/medical concerns  Walk 10 feet activity   Assist  Walk 10 feet activity did not occur: Safety/medical concerns        Walk 50 feet activity   Assist Walk 50 feet with 2 turns activity did not occur: Safety/medical concerns         Walk 150 feet activity   Assist Walk 150 feet activity did not occur: Safety/medical concerns         Walk 10 feet on uneven surface   activity   Assist Walk 10 feet on uneven surfaces activity did not occur: Safety/medical concerns         Wheelchair     Assist     Wheelchair activity did not occur: Safety/medical concerns         Wheelchair 50 feet with 2 turns activity    Assist    Wheelchair 50 feet with 2 turns activity did not occur: Safety/medical concerns       Wheelchair 150 feet activity     Assist  Wheelchair 150 feet activity did not occur: Safety/medical concerns       Blood pressure 136/65, pulse 95, temperature 98.5 F (36.9 C), resp. rate 20, height 5\' 1"  (1.549 m), weight 80.1 kg, SpO2 98 %.  Medical Problem List and Plan: 1.  Decreased functional mobility secondary to left displaced femoral neck fracture status post left total hip replacement anterior approach as well as severely left comminuted distal radius fracture status post ORIF 12/08/2019 as well as forearm laceration repair.  Weightbearing as tolerated through left elbow only and weightbearing as tolerated left lower extremity.  No left hip abduction. Most recent CT from 1/27 revealed fx's of left SPR/IPR and left sacrum.              -patient may  shower if left arm and hip wound covered            -family ed completed           2/12 dc home today with [facility]  -Patient to see Dr. Ranell Patrick in the office for transitional care encounter in 1-2 weeks.   -ordered f/u wrist films today prior to discharge 2.  Antithrombotics: -DVT/anticoagulation: SCDs.  Dopplers negative   lovenox for dvt prophylaxis 40mg  daily             -antiplatelet therapy: Aspirin 325 mg daily 3. Pain Management: continue Hydrocodone and Robaxin as needed  -kpad/ice prn              home on scheduled robaxin, prn hydrocodone  -further mgt as outpt  -anxiety mgt 4. Mood: Prozac 40 mg daily  2/1: xanax 0.25mg  bid at 0700  and 1200 to help with mobility associated anxiety  2/5 stop xanax d/t persistent delays in processing  2/12 pt is aware that  her anxiety is limiting. 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 Neuropsych: This patient is capable of making decisions on her own behalf. 6. Skin/Wound Care: dc staples from hip today 7. Fluids/Electrolytes/Nutrition: intake poor  Continue to push po 8.  Polycystic kidney disease status post renal transplant 06/02/2012.  Baseline creatinine 1.02-1.29.  Continue Prograf, chronic prednisone as well as Myfortic.  Followed by Dr. Jimmy Footman             2/11 sodium holding at 136 today, better off lasix 9.  Acute blood loss anemia.  Transfused  2 units packed red blood cells 12/11/2019.               Hgb holding at 9.8 on 1.22, 9.2 on 1/25  2/1: hgb 8.9   10.  Asthma.  Continue inhalers as directed. 11.  History of gout.  Allopurinol daily.  Monitor for any gout flareups 12.  Chronic diastolic congestive heart failure.  Lasix 40 mg daily. Monitor for sign/symptoms of fluid overload.    1/26-?trend, unsure about weights---need more together to assess for trend, she was -20cc for today by I/O's yesterday  -holding lasix at least for tomorrow d/t #8  1/29, 1/30 weight stable  1/31: increased weight, does appear to have increased work of breathing. Received Lasix 40 this morning. Will add additional 20mg  now.   2/9 weights stable  2/12: weight stable    Filed Weights   01/01/20 0559 01/02/20 0338 01/04/20 0500  Weight: 83 kg 83.9 kg 80.1 kg    13. Constipation: sorbitol, SSE   if needed  -scheduled senna-s  -large BM 1/30  2/3: constipated: scheduled Miralax.   2/8 multiple mushy bms    -holding miralax and senokot-s now that narcs substantially decr  2/9 scheduled imodium and probiotic  2/11 bowels have improved,  imodium prn  LOS: 21 days A FACE TO FACE EVALUATION WAS PERFORMED  Meredith Staggers 01/05/2020, 10:47 AM

## 2020-01-05 NOTE — Progress Notes (Signed)
Late entryLinna Hoff PA reviewed discharge instructions with patient. Patient belongings packed and sent home with patient. Patient transported via stretcher by [facility] for discharge home.

## 2020-01-05 NOTE — Plan of Care (Signed)
  Problem: Consults Goal: RH GENERAL PATIENT EDUCATION Description: See Patient Education module for education specifics. Outcome: Completed/Met   Problem: RH BOWEL ELIMINATION Goal: RH STG MANAGE BOWEL WITH ASSISTANCE Description: STG Manage Bowel with Mod Assistance. Outcome: Completed/Met   Problem: RH BLADDER ELIMINATION Goal: RH STG MANAGE BLADDER WITH ASSISTANCE Description: STG Manage Bladder With Mod Assistance Outcome: Completed/Met   Problem: RH SKIN INTEGRITY Goal: RH STG SKIN FREE OF INFECTION/BREAKDOWN Description: No new breakdown with min assist  Outcome: Completed/Met Goal: RH STG ABLE TO PERFORM INCISION/WOUND CARE W/ASSISTANCE Description: STG Able To Perform Incision/Wound Care With Mod Assistance. Outcome: Completed/Met   Problem: RH SAFETY Goal: RH STG ADHERE TO SAFETY PRECAUTIONS W/ASSISTANCE/DEVICE Description: STG Adhere to Safety Precautions With Mod Assistance/Device. Outcome: Completed/Met   Problem: RH PAIN MANAGEMENT Goal: RH STG PAIN MANAGED AT OR BELOW PT'S PAIN GOAL Description: < 4 out of 10.  Outcome: Completed/Met

## 2020-01-05 NOTE — Progress Notes (Addendum)
Social Work Discharge Note   The overall goal for the admission was met for:   Discharge location: Yes. Pt discharged to home via [facility] ambulance [phone]) due to hip fracture.   Length of Stay: Yes. 21 days  Discharge activity level: Yes. Min to [employer]  Home/community participation: Yes  Services provided included: MD, RD, PT, OT, RN, CM, TR, Pharmacy, Neuropsych and SW  Financial Services: Other: [employer] Medicare  Follow-up services arranged: Home Health: [facility] (HHPT/OT/Aide/SW) [phone] and DME: [facility] [phone] (hospital bed)  Comments (or additional information): Pt discharged to home with support from her husband, PRN assistance from dtr, and PRN support from son (who lives in the home). Pt primary caregiver her husband Edison Nasuti [phone]) until his return to work at the beginning of March.  Patient/Family verbalized understanding of follow-up arrangements: Yes  Individual responsible for coordination of the follow-up plan: Pt husband to coordinate pt d/c needs.   Confirmed correct DME delivered: [name] 01/05/2020    Loralee Pacas, MSW, [place] Office: [phone] Cell: [phone] Fax: [phone]

## 2020-01-07 ENCOUNTER — Encounter (HOSPITAL_COMMUNITY): Payer: Self-pay

## 2020-01-07 ENCOUNTER — Other Ambulatory Visit: Payer: Self-pay

## 2020-01-07 ENCOUNTER — Emergency Department (HOSPITAL_COMMUNITY): Payer: Medicare Other

## 2020-01-07 ENCOUNTER — Emergency Department (HOSPITAL_COMMUNITY)
Admission: EM | Admit: 2020-01-07 | Discharge: 2020-01-08 | Disposition: A | Discharge status: Home / Self Care | Payer: Medicare Other | Attending: Emergency Medicine | Admitting: Emergency Medicine

## 2020-01-07 DIAGNOSIS — R0600 Dyspnea, unspecified: Secondary | ICD-10-CM | POA: Insufficient documentation

## 2020-01-07 DIAGNOSIS — R0602 Shortness of breath: Secondary | ICD-10-CM | POA: Diagnosis present

## 2020-01-07 DIAGNOSIS — Z87891 Personal history of nicotine dependence: Secondary | ICD-10-CM | POA: Insufficient documentation

## 2020-01-07 DIAGNOSIS — Z79899 Other long term (current) drug therapy: Secondary | ICD-10-CM | POA: Insufficient documentation

## 2020-01-07 DIAGNOSIS — Z940 Kidney transplant status: Secondary | ICD-10-CM | POA: Insufficient documentation

## 2020-01-07 DIAGNOSIS — Z7982 Long term (current) use of aspirin: Secondary | ICD-10-CM | POA: Insufficient documentation

## 2020-01-07 DIAGNOSIS — J45909 Unspecified asthma, uncomplicated: Secondary | ICD-10-CM | POA: Insufficient documentation

## 2020-01-07 DIAGNOSIS — I5032 Chronic diastolic (congestive) heart failure: Secondary | ICD-10-CM | POA: Insufficient documentation

## 2020-01-07 DIAGNOSIS — J441 Chronic obstructive pulmonary disease with (acute) exacerbation: Secondary | ICD-10-CM | POA: Insufficient documentation

## 2020-01-07 DIAGNOSIS — Z20822 Contact with and (suspected) exposure to covid-19: Secondary | ICD-10-CM | POA: Diagnosis not present

## 2020-01-07 LAB — POC SARS CORONAVIRUS 2 AG -  ED: SARS Coronavirus 2 Ag: NEGATIVE

## 2020-01-07 LAB — CBC
HCT: 29.4 % — ABNORMAL LOW (ref 36.0–46.0)
Hemoglobin: 8.6 g/dL — ABNORMAL LOW (ref 12.0–15.0)
MCH: 28.5 pg (ref 26.0–34.0)
MCHC: 29.3 g/dL — ABNORMAL LOW (ref 30.0–36.0)
MCV: 97.4 fL (ref 80.0–100.0)
Platelets: 251 10*3/uL (ref 150–400)
RBC: 3.02 MIL/uL — ABNORMAL LOW (ref 3.87–5.11)
RDW: 19.6 % — ABNORMAL HIGH (ref 11.5–15.5)
WBC: 9.7 10*3/uL (ref 4.0–10.5)
nRBC: 0 % (ref 0.0–0.2)

## 2020-01-07 LAB — BASIC METABOLIC PANEL
Anion gap: 9 (ref 5–15)
BUN: 19 mg/dL (ref 8–23)
CO2: 23 mmol/L (ref 22–32)
Calcium: 8.9 mg/dL (ref 8.9–10.3)
Chloride: 97 mmol/L — ABNORMAL LOW (ref 98–111)
Creatinine, Ser: 0.79 mg/dL (ref 0.44–1.00)
GFR calc Af Amer: >60 mL/min (ref >60)
GFR calc non Af Amer: >60 mL/min (ref >60)
Glucose, Bld: 101 mg/dL — ABNORMAL HIGH (ref 70–99)
Potassium: 4.6 mmol/L (ref 3.5–5.1)
Sodium: 129 mmol/L — ABNORMAL LOW (ref 135–145)

## 2020-01-07 LAB — D-DIMER, QUANTITATIVE: D-Dimer, Quant: 4.76 ug/mL-FEU — ABNORMAL HIGH (ref 0.00–0.50)

## 2020-01-07 LAB — BRAIN NATRIURETIC PEPTIDE: B Natriuretic Peptide: 535.4 pg/mL — ABNORMAL HIGH (ref 0.0–100.0)

## 2020-01-07 MED ORDER — ALBUTEROL SULFATE HFA 108 (90 BASE) MCG/ACT IN AERS
4.0000 | INHALATION_SPRAY | Freq: Once | RESPIRATORY_TRACT | Status: AC
  Administered 2020-01-07: 4 via RESPIRATORY_TRACT

## 2020-01-07 MED ORDER — ALBUTEROL SULFATE HFA 108 (90 BASE) MCG/ACT IN AERS
4.0000 | INHALATION_SPRAY | Freq: Once | RESPIRATORY_TRACT | Status: AC
  Administered 2020-01-07: 4 via RESPIRATORY_TRACT
  Filled 2020-01-07: qty 6.7

## 2020-01-07 MED ORDER — IOHEXOL 350 MG/ML SOLN
100.0000 mL | Freq: Once | INTRAVENOUS | Status: AC | PRN
  Administered 2020-01-07: 100 mL via INTRAVENOUS

## 2020-01-07 MED ORDER — SODIUM CHLORIDE (PF) 0.9 % IJ SOLN
INTRAMUSCULAR | Status: AC
  Filled 2020-01-07: qty 50

## 2020-01-07 MED ORDER — TECHNETIUM TO 99M ALBUMIN AGGREGATED: 1.5000 | Freq: Once | INTRAVENOUS | Status: DC | PRN

## 2020-01-07 MED ORDER — ONDANSETRON HCL 4 MG/2ML IJ SOLN
4.0000 mg | Freq: Once | INTRAMUSCULAR | Status: AC
  Administered 2020-01-07: 4 mg via INTRAVENOUS
  Filled 2020-01-07: qty 2

## 2020-01-07 MED ORDER — METHYLPREDNISOLONE SODIUM SUCC 125 MG IJ SOLR
125.0000 mg | Freq: Once | INTRAMUSCULAR | Status: AC
  Administered 2020-01-07: 125 mg via INTRAVENOUS
  Filled 2020-01-07: qty 2

## 2020-01-07 MED ORDER — TECHNETIUM TC 99M DIETHYLENETRIAME-PENTAACETIC ACID
38.0000 | Freq: Once | INTRAVENOUS | Status: AC | PRN
  Administered 2020-01-07: 38 via INTRAVENOUS

## 2020-01-07 NOTE — ED Provider Notes (Signed)
Renal transplant (polycystic) Jan adm fx hip, wrist, d/ch to rehab, home 2 days ago Here for SOB, ?asthma Nebs and solumedrol w/o wheezing VQ scan pending for elevated d-dimer, pending read  Re-evaluate to determine disposition Ambulate with pulse ox Review VQ  11:15 - VQ scan low probability Recheck of the patient. She is not wheezing. She reports her SOB is much better after the nebulizer tx here and solumedrol.   She is very somnolent. O2 saturation 97% on RA. She has significantly prolonged expirations. Capnography, ABG ordered.  ABG reviewed and without concerning finding. The patient reports she continues to feel improved. She states she is comfortable being discharged to home. Return precautions discussed.     Charlann Lange, PA-C 01/08/20 2130    Dorie Rank, MD 01/09/20 1048

## 2020-01-07 NOTE — ED Notes (Signed)
Pt placed on capnography. Per request from [place], [place]

## 2020-01-07 NOTE — ED Notes (Signed)
Pt transported to [facility] for VQ Scan.

## 2020-01-07 NOTE — ED Notes (Signed)
Pt Returned from [employer]

## 2020-01-07 NOTE — ED Provider Notes (Signed)
[place] DEPT Provider Note   CSN: [id number] Arrival date & time: 01/07/20  1556     History Chief Complaint  Patient presents with  . Shortness of Breath    April [name] is a 64 y.o. female.  HPI   Patient has a complex medical history of polycystic kidney disease, asthma status post renal transplant.  Patient was admitted to the hospital on January 14 for treatment of injuries associated with a fall at home.  Patient sustained a left wrist fracture, as well as left hip fracture.  Patient had surgery and was ultimately discharged from the hospital on January 20 to [facility] health rehab facility.  Patient was discharged from there on February 12, 2 days ago.  Patient presented to the ED today for difficulty with her breathing.  Patient states she has a history of asthma and has had difficulties like this before.  She usually requires steroids even though her lungs sound fine.  Patient has noticed some leg swelling.  Her mobility is still rather limited but her family is helping her at home.  She denies any fevers or chills.  No chest pain.  Patient has not been taking her meds well the last day or so because of nausea. Past Medical History:  Diagnosis Date  . Arthritis   . Asthma   . Depression   . GERD (gastroesophageal reflux disease)   . Hyperlipidemia   . Hyperparathyroidism   . Polycystic kidney   . PONV (postoperative nausea and vomiting)     Patient Active Problem List   Diagnosis Date Noted  . Fracture of left superior pubic ramus (HCC)   . Pain   . Anxiety state   . Left displaced femoral neck fracture ([place]) 12/15/2019  . Status post total hip replacement, left   . Post-op pain   . Acute blood loss anemia   . Polycystic kidney   . Closed left hip fracture ([place]) 12/07/2019  . Left wrist fracture 12/07/2019  . Medication management 09/22/2019  . Physical deconditioning 09/22/2019  . Hyponatremia 06/29/2019  . Acute respiratory  failure with hypoxia ([place]) 04/19/2019  . Acute on chronic diastolic CHF (congestive heart failure) ([place]) 04/19/2019  . Bilateral closed proximal tibial fracture 12/21/2018  . Closed right ankle fracture 12/21/2018  . Fracture 12/21/2018  . Lower urinary tract infectious disease 10/03/2018  . Asthma, chronic, unspecified asthma severity, with acute exacerbation 10/03/2018  . SIRS (systemic inflammatory response syndrome) ([place]) 10/03/2018  . Nausea vomiting and diarrhea 10/02/2018  . Chronic diastolic CHF (congestive heart failure) ([place]) 10/01/2017  . Influenza B 10/01/2017  . Persistent asthma with status asthmaticus   . Pneumonia 07/15/2016  . Asthma, mild intermittent 07/15/2016  . GERD (gastroesophageal reflux disease) 07/15/2016  . Renal transplant recipient 07/15/2016  . Hyperlipidemia 07/15/2016  . Depression 07/15/2016  . Bronchitis, mucopurulent recurrent ([place]) 08/08/2014  . Chronic cough 07/24/2014  . End stage renal disease ([place]) 03/23/2012  . 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 BILATERAL OOPHORECTOMY    . CARPAL TUNNEL RELEASE  2000  . CESAREAN SECTION    . INSERTION OF DIALYSIS CATHETER  03/31/2012   Procedure: INSERTION OF DIALYSIS CATHETER;  Surgeon: Angelia Mould, MD;  Location: [place];  Service: Vascular;  Laterality: N/A;  insertion of dialysis catheter right internal jugular  vein  . 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 ORIF TIBIA FRACTURE Left 12/23/2018   Procedure: OPEN REDUCTION INTERNAL FIXATION (ORIF) TIBIA FRACTURE;  Surgeon: Shona Needles, MD;  Location: [place];  Service: Orthopedics;  Laterality: Left;  . ORIF TIBIA PLATEAU Right 12/23/2018   Procedure: OPEN REDUCTION INTERNAL FIXATION (ORIF) TIBIAL PLATEAU;  Surgeon: Shona Needles, MD;  Location: [place];  Service: Orthopedics;  Laterality:  Right;  . ORIF WRIST FRACTURE Left 12/08/2019   Procedure: OPEN REDUCTION INTERNAL FIXATION (ORIF) WRIST FRACTURE, REPAIR LACERATION LEFT FOREARM;  Surgeon: Dorna Leitz, MD;  Location: [facility];  Service: Orthopedics;  Laterality: Left;  . TONSILLECTOMY  1967  . TOTAL HIP ARTHROPLASTY Left 12/08/2019   Procedure: TOTAL HIP ARTHROPLASTY ANTERIOR APPROACH;  Surgeon: Dorna Leitz, MD;  Location: [facility];  Service: Orthopedics;  Laterality: Left;  . TUBAL LIGATION  2010     OB History   No obstetric history on file.     Family History  Problem Relation Age of Onset  . Hypertension Mother   . Heart disease Father        CABG history  . Polycystic kidney disease Father   . Polycystic kidney disease Brother     Social History   Tobacco Use  . Smoking status: Former Smoker    Packs/day: 0.25    Years: 15.00    Pack years: 3.75    Types: Cigarettes    Quit date: 03/22/1998    Years since quitting: 21.8  . Smokeless tobacco: Never Used  Substance Use Topics  . Alcohol use: No    Comment: social  . Drug use: No    Home Medications Prior to Admission medications   Medication Sig Start Date End Date Taking? Authorizing Provider  albuterol (VENTOLIN HFA) 108 (90 Base) MCG/ACT inhaler Inhale 2 puffs into the lungs every 4 (four) hours as needed for wheezing or shortness of breath. 01/04/20  Yes Angiulli, Lavon Paganini, PA-C  allopurinol (ZYLOPRIM) 300 MG tablet Take 1 tablet (300 mg total) by mouth daily. 01/04/20  Yes Angiulli, Lavon Paganini, PA-C  aspirin EC 325 MG EC tablet Take 1 tablet (325 mg total) by mouth daily with breakfast. 01/05/20  Yes Angiulli, Lavon Paganini, PA-C  brimonidine (ALPHAGAN P) 0.1 % SOLN Place 1 drop 2 (two) times daily into the left eye.   Yes [provider]  calcium carbonate (TUMS - DOSED IN MG ELEMENTAL CALCIUM) 500 MG chewable tablet Chew 2 tablets (400 mg of elemental calcium total) by mouth 4 (four) times daily as needed for indigestion or heartburn. 01/04/20  Yes  Angiulli, Lavon Paganini, PA-C  cholecalciferol (VITAMIN D3) 25 MCG (1000 UNIT) tablet Take 1 tablet (1,000 Units total) by mouth daily. 01/04/20  Yes Angiulli, Lavon Paganini, PA-C  cycloSPORINE (RESTASIS) 0.05 % ophthalmic emulsion Place 1 drop into both eyes 2 (two) times daily. 01/04/20  Yes Angiulli, Lavon Paganini, PA-C  docusate sodium (COLACE) 100 MG capsule Take 1 capsule (100 mg total) by mouth 2 (two) times daily. 12/13/19  Yes Shelly Coss, MD  dorzolamide (TRUSOPT) 2 % ophthalmic solution Place 1 drop into both eyes daily. 06/25/19  Yes [provider]  FLUoxetine (PROZAC) 40 MG capsule Take 1 capsule (40 mg total) by mouth daily. 01/04/20  Yes Angiulli, Lavon Paganini, PA-C  HYDROcodone-acetaminophen (NORCO/VICODIN) 5-325 MG tablet Take 1 tablet by mouth every 4 (four) hours as needed for severe pain. 01/04/20  Yes Angiulli, Lavon Paganini, PA-C  magnesium oxide (  MAG-OX) 400 MG tablet Take 1 tablet (400 mg total) by mouth 2 (two) times daily. 01/04/20  Yes Angiulli, Lavon Paganini, PA-C  methocarbamol (ROBAXIN) 500 MG tablet Take 1 tablet (500 mg total) by mouth 4 (four) times daily. 01/04/20  Yes Angiulli, Lavon Paganini, PA-C  mycophenolate (MYFORTIC) 180 MG EC tablet Take 180 mg by mouth 2 (two) times daily.   Yes [provider]  pantoprazole (PROTONIX) 40 MG tablet Take 1 tablet (40 mg total) by mouth 2 (two) times daily. 01/04/20  Yes Angiulli, Lavon Paganini, PA-C  polyethylene glycol (MIRALAX / GLYCOLAX) 17 g packet Take 17 g by mouth daily as needed for mild constipation. 12/13/19  Yes Shelly Coss, MD  predniSONE (DELTASONE) 5 MG tablet Take 7.5 mg by mouth daily.    Yes [provider]  PROGRAF 1 MG capsule Take 1 mg by mouth daily. 03/22/19  Yes [provider]  saccharomyces boulardii (FLORASTOR) 250 MG capsule Take 1 capsule (250 mg total) by mouth 2 (two) times daily. 01/04/20  Yes Angiulli, Lavon Paganini, PA-C  SYMBICORT 160-4.5 MCG/ACT inhaler Inhale 2 puffs into the lungs 2 (two) times daily.  01/04/20  Yes Angiulli, Lavon Paganini, PA-C  tacrolimus (PROGRAF) 0.5 MG capsule Take 0.5 mg by mouth at bedtime. Takes brand name Prograf   Yes [provider]  TRAVATAN Z 0.004 % SOLN ophthalmic solution Place 1 drop into both eyes at bedtime. 07/03/16  Yes [provider]  vitamin B-12 (CYANOCOBALAMIN) 100 MCG tablet Take 100 mcg by mouth daily.   Yes [provider]    Allergies    Infed [iron dextran], Pentamidine, Erythromycin [erythromycin], Iohexol, Oxycodone, Erythromycin, and Ultram [tramadol hcl]  Review of Systems   Review of Systems  All other systems reviewed and are negative.   Physical Exam Updated Vital Signs BP (!) 152/63   Pulse (!) 109   Temp 99.5 F (37.5 C) (Oral)   Resp (!) 25   SpO2 92%   Physical Exam Vitals and nursing note reviewed.  Constitutional:      Appearance: She is well-developed. She is ill-appearing.  HENT:     Head: Normocephalic and atraumatic.     Right Ear: External ear normal.     Left Ear: External ear normal.  Eyes:     General: No scleral icterus.       Right eye: No discharge.        Left eye: No discharge.     Conjunctiva/sclera: Conjunctivae normal.  Neck:     Trachea: No tracheal deviation.  Cardiovascular:     Rate and Rhythm: Normal rate and regular rhythm.  Pulmonary:     Effort: Tachypnea present. No respiratory distress.     Breath sounds: Normal breath sounds. No stridor. No decreased breath sounds, wheezing or rales.  Abdominal:     General: Bowel sounds are normal. There is no distension.     Palpations: Abdomen is soft.     Tenderness: There is no abdominal tenderness. There is no guarding or rebound.  Musculoskeletal:        General: No tenderness.     Cervical back: Neck supple.     Right lower leg: Edema present.     Left lower leg: Edema present.     Comments: Left wrist in a splint   Skin:    General: Skin is warm and dry.     Findings: No rash.  Neurological:     Mental  Status: She is alert.  Cranial Nerves: No cranial nerve deficit (no facial droop, extraocular movements intact, no slurred speech).     Sensory: No sensory deficit.     Motor: No tremor, abnormal muscle tone or seizure activity.     Coordination: Coordination normal.     ED Results / Procedures / Treatments   Labs (all labs ordered are listed, but only abnormal results are displayed) Labs Reviewed  BASIC METABOLIC PANEL - Abnormal; Notable for the following components:      Result Value   Sodium 129 (*)    Chloride 97 (*)    Glucose, Bld 101 (*)    All other components within normal limits  CBC - Abnormal; Notable for the following components:   RBC 3.02 (*)    Hemoglobin 8.6 (*)    HCT 29.4 (*)    MCHC 29.3 (*)    RDW 19.6 (*)    All other components within normal limits  BRAIN NATRIURETIC PEPTIDE - Abnormal; Notable for the following components:   B Natriuretic Peptide 535.4 (*)    All other components within normal limits  D-DIMER, QUANTITATIVE (NOT AT [facility]) - Abnormal; Notable for the following components:   D-Dimer, Quant 4.76 (*)    All other components within normal limits  POC SARS CORONAVIRUS 2 AG -  ED    EKG None  Radiology DG Chest Port 1 View  Result Date: 01/07/2020 CLINICAL DATA:  Shortness of breath for 2 days EXAM: PORTABLE CHEST 1 VIEW COMPARISON:  12/09/2019 FINDINGS: Cardiac shadow is mildly enlarged but stable. The lungs are well aerated bilaterally. Minimal blunting of the costophrenic angles is noted bilaterally. No acute abnormality is noted. IMPRESSION: No acute abnormality seen. Electronically Signed   By: Inez Catalina M.D.   On: 01/07/2020 17:10    Procedures Procedures (including critical care time)  Medications Ordered in ED Medications  sodium chloride (PF) 0.9 % injection (0 mLs  Hold 01/07/20 1921)  albuterol (VENTOLIN HFA) 108 (90 Base) MCG/ACT inhaler 4 puff (4 puffs Inhalation Given 01/07/20 1648)  methylPREDNISolone sodium  succinate (SOLU-MEDROL) 125 mg/2 mL injection 125 mg (125 mg Intravenous Given 01/07/20 1647)  ondansetron (ZOFRAN) injection 4 mg (4 mg Intravenous Given 01/07/20 1647)  albuterol (VENTOLIN HFA) 108 (90 Base) MCG/ACT inhaler 4 puff (4 puffs Inhalation Given 01/07/20 1841)  iohexol (OMNIPAQUE) 350 MG/ML injection 100 mL (100 mLs Intravenous Contrast Given 01/07/20 1850)    ED Course  I have reviewed the triage vital signs and the nursing notes.  Pertinent labs & imaging results that were available during my care of the patient were reviewed by me and considered in my medical decision making (see chart for details).  Clinical Course as of Jan 06 2202  Nancy Fetter Jan 07, 2020  [place] reviewed.  D Dimer elevated.  Pt with recent surgery.  At risk for PE.  DIscussed with pt, ct angio ordered for further eval.  Before getting test, pt informed CT tech she was told to never receive IV contrast because of her renal transplant.  Will order VQ scan   [JK]    Clinical Course User Index [JK] Dorie Rank, MD   MDM Rules/Calculators/A&P                      Patient presented to ED with complaints of shortness of breath.  Patient was treated with albuterol as well as Solu-Medrol.  No definite wheezing but she has a history of asthma.  Patient was recently  in the hospital recovering from surgery.  Patient is certainly at risk for DVT and she has a positive D-dimer.  Patient has history of renal transplant therefore a VQ scan was ordered.  Test is currently pending plan.  Plan on disposition after the results of the VQ scan and reassessment of her breathing status.  If patient continues to feel short of breath and there is no PE could consider admission for COPD/asthma exacerbation. Care turn over to PA Julian  Final Clinical Impression(s) / ED Diagnoses Final diagnoses:  Dyspnea, unspecified type    Rx / DC Orders ED Discharge Orders    None       Dorie Rank, MD 01/07/20 2203

## 2020-01-07 NOTE — ED Triage Notes (Signed)
EMS reports from home, SOB x 2 days. Pt left [facility] rehab facility Friday after left hip replacement, hip fractures and arm fracture. Pt unable to take meds or eat x 1 day due to nausea. Hx of asthma.  BP 180/100 HR 110 RR 22 Sp02 96 RA Temp 97.4

## 2020-01-08 LAB — BLOOD GAS, ARTERIAL
Acid-base deficit: 1.6 mmol/L (ref 0.0–2.0)
Bicarbonate: 22.6 mmol/L (ref 20.0–28.0)
FIO2: 21
O2 Saturation: 95.5 %
Patient temperature: 99.5
pCO2 arterial: 39.6 mmHg (ref 32.0–48.0)
pH, Arterial: 7.377 (ref 7.350–7.450)
pO2, Arterial: 81.8 mmHg — ABNORMAL LOW (ref 83.0–108.0)

## 2020-01-08 MED ORDER — PREDNISONE 50 MG PO TABS: ORAL_TABLET | ORAL | 0 refills | Status: AC

## 2020-01-08 NOTE — Telephone Encounter (Signed)
[name] RN [facility] called requesting a start up for home health orders for PT, OT, and SW.  Called her back and approved verbal orders for start ups.

## 2020-01-08 NOTE — Discharge Instructions (Signed)
Use your nebulizer at home every 4 hours for treatment of shortness of breath, likely a COPD exacerbation. Increase your prednisone to 50 mg daily for the next 3 days, then return to 7.5 mg daily.   If you develop any fever, increased or uncontrolled shortness of breath or new have new chest pain, please return to the emergency department.   Schedule an appointment with your doctor for recheck in 3 days, which can be a virtual visit initially if you are doing well.

## 2020-01-09 ENCOUNTER — Telehealth: Payer: Self-pay

## 2020-01-09 NOTE — Telephone Encounter (Signed)
Transitional Care call--patient    1. Are you/is patient experiencing any problems since coming home? Went back to ED, Asthma attack Are there any questions regarding any aspect of care? No 2. Are there any questions regarding medications administration/dosing? No Are meds being taken as prescribed? Yes Patient should review meds with caller to confirm 3. Have there been any falls? No 4. Has Home Health been to the house and/or have they contacted you? Yes If not, have you tried to contact them? Can we help you contact them? 5. Are bowels and bladder emptying properly? Yes Are there any unexpected incontinence issues? No If applicable, is patient following bowel/bladder programs? 6. Any fevers, problems with breathing, unexpected pain? Problems breathing went to ED 7. Are there any skin problems or new areas of breakdown? No 8. Has the patient/family member arranged specialty MD follow up (ie cardiology/neurology/renal/surgical/etc)?  Not yet Can we help arrange? 9. Does the patient need any other services or support that we can help arrange? SW from [facility] coming out to help 10. Are caregivers following through as expected in assisting the patient? Yes 11. Has the patient quit smoking, drinking alcohol, or using drugs as recommended? Yes  Appointment time 12:00, arrive time 11:40 with Dr. Ranell Patrick [address] [phone]

## 2020-01-10 ENCOUNTER — Telehealth: Payer: Self-pay | Admitting: Physical Medicine & Rehabilitation

## 2020-01-10 NOTE — Telephone Encounter (Signed)
SW returned pt phone call to discuss that she would need to private purchase left platform attachment for her current walker. Pt just wanted to clarify. No further questions/concerns per pt.

## 2020-01-18 ENCOUNTER — Other Ambulatory Visit: Payer: Self-pay

## 2020-01-18 ENCOUNTER — Encounter
Payer: Medicare Other | Attending: Physical Medicine and Rehabilitation | Admitting: Physical Medicine and Rehabilitation

## 2020-01-18 ENCOUNTER — Encounter: Payer: Self-pay | Admitting: Physical Medicine and Rehabilitation

## 2020-01-18 VITALS — BP 124/69 | Ht 61.5 in | Wt 176.8 lb

## 2020-01-18 DIAGNOSIS — R52 Pain, unspecified: Secondary | ICD-10-CM

## 2020-01-18 DIAGNOSIS — S72002A Fracture of unspecified part of neck of left femur, initial encounter for closed fracture: Secondary | ICD-10-CM | POA: Diagnosis not present

## 2020-01-18 DIAGNOSIS — S32512G Fracture of superior rim of left pubis, subsequent encounter for fracture with delayed healing: Secondary | ICD-10-CM

## 2020-01-18 DIAGNOSIS — Z96642 Presence of left artificial hip joint: Secondary | ICD-10-CM

## 2020-01-18 DIAGNOSIS — R5381 Other malaise: Secondary | ICD-10-CM | POA: Diagnosis not present

## 2020-01-18 NOTE — Progress Notes (Signed)
Subjective:    Patient ID: April [name], female    DOB: 07/13/1956, 64 y.o.   MRN: [id number]  HPI  Due to national recommendations of social distancing because of COVID 71, an audio/video tele-health visit is felt to be the most appropriate encounter for this patient at this time. See MyChart message from today for the patient's consent to a tele-health encounter with [facility]. This is a follow up tele-visit via Webex. The patient is at home. MD is at office.   April [name] presents for transitional care follow-up after CIR admission for left total hip replacement for femoral neck fracture with associated pelvic ring fractures.   She has received home therapy evaluations but no subsequent sessions. She does have their phone number to follow-up regarding this.   She has followed up with her PCP and her pain is currently well controlled. She has had no falls at home.   All medications were reviewed with patient and she is taking them as prescribed. She requires no refills. She has no questions regarding her medications.   She did have to go to the ED on 2/14 for an asthma attack. She is urinating well and having regular BM. She is sleeping well at night.   She asks whether she should have follow-up with her orthopedic surgeon as well.   Pain Inventory Average Pain 4 Pain Right Now 4 My pain is intermittent  In the last 24 hours, has pain interfered with the following? General activity 4 Relation with others 4 Enjoyment of life 4 What TIME of day is your pain at its worst? daytime Sleep (in general) Fair  Pain is worse with: some activites Pain improves with: rest and medication Relief from Meds: 5  Mobility Do you have any goals in this area?  yes  Function Do you have any goals in this area?  yes  Neuro/Psych trouble walking  Prior Studies Any changes since last visit?  no  Physicians involved in your care Any changes since  last visit?  no   Family History  Problem Relation Age of Onset  . Hypertension Mother   . Heart disease Father        CABG history  . Polycystic kidney disease Father   . Polycystic kidney disease Brother    Social History   Socioeconomic History  . Marital status: Married    Spouse name: Not on file  . Number of children: Not on file  . Years of education: Not on file  . Highest education level: Not on file  Occupational History  . Occupation: retired  Tobacco Use  . Smoking status: Former Smoker    Packs/day: 0.25    Years: 15.00    Pack years: 3.75    Types: Cigarettes    Quit date: 03/22/1998    Years since quitting: 21.8  . Smokeless tobacco: Never Used  Substance and Sexual Activity  . Alcohol use: No    Comment: social  . Drug use: No  . Sexual activity: Not on file  Other Topics Concern  . Not on file  Social History Narrative   Married, lives with spouse, son and mother   2 children > son and daughter   OCCUPATION: retired Education officer, museum   Social Determinants of Radio broadcast assistant Strain:   . Difficulty of Paying Living Expenses: Not on file  Food Insecurity:   . Worried About Charity fundraiser in the Last Year:  Not on file  . Ran Out of Food in the Last Year: Not on file  Transportation Needs:   . Lack of Transportation (Medical): Not on file  . Lack of Transportation (Non-Medical): Not on file  Physical Activity:   . Days of Exercise per Week: Not on file  . Minutes of Exercise per Session: Not on file  Stress:   . Feeling of Stress : Not on file  Social Connections:   . Frequency of Communication with Friends and Family: Not on file  . Frequency of Social Gatherings with Friends and Family: Not on file  . Attends Religious Services: Not on file  . Active Member of Clubs or Organizations: Not on file  . Attends Archivist Meetings: Not on file  . Marital Status: Not on file   Past Surgical History:  Procedure Laterality  Date  . 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 BILATERAL OOPHORECTOMY    . CARPAL TUNNEL RELEASE  2000  . CESAREAN SECTION    . INSERTION OF DIALYSIS CATHETER  03/31/2012   Procedure: INSERTION OF DIALYSIS CATHETER;  Surgeon: Angelia Mould, MD;  Location: [place];  Service: Vascular;  Laterality: N/A;  insertion of dialysis catheter right internal jugular vein  . 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 ORIF TIBIA FRACTURE Left 12/23/2018   Procedure: OPEN REDUCTION INTERNAL FIXATION (ORIF) TIBIA FRACTURE;  Surgeon: Shona Needles, MD;  Location: [place];  Service: Orthopedics;  Laterality: Left;  . ORIF TIBIA PLATEAU Right 12/23/2018   Procedure: OPEN REDUCTION INTERNAL FIXATION (ORIF) TIBIAL PLATEAU;  Surgeon: Shona Needles, MD;  Location: [place];  Service: Orthopedics;  Laterality: Right;  . ORIF WRIST FRACTURE Left 12/08/2019   Procedure: OPEN REDUCTION INTERNAL FIXATION (ORIF) WRIST FRACTURE, REPAIR LACERATION LEFT FOREARM;  Surgeon: Dorna Leitz, MD;  Location[facility];  Service: Orthopedics;  Laterality: Left;  . TONSILLECTOMY  1967  . TOTAL HIP ARTHROPLASTY Left 12/08/2019   Procedure: TOTAL HIP ARTHROPLASTY ANTERIOR APPROACH;  Surgeon: Dorna Leitz, MD;  [facility];  Service: Orthopedics;  Laterality: Left;  . TUBAL LIGATION  2010   Past Medical History:  Diagnosis Date  . Arthritis   . Asthma   . Depression   . GERD (gastroesophageal reflux disease)   . Hyperlipidemia   . Hyperparathyroidism   . Polycystic kidney   . PONV (postoperative nausea and vomiting)    There were no vitals taken for this visit.  Opioid Risk Score:   Fall Risk Score:  `1  Depression screen PHQ 2/9  No flowsheet data found.  Review of Systems  Constitutional: Negative.   HENT: Negative.   Eyes: Negative.   Respiratory: Negative.   Cardiovascular: Negative.   Gastrointestinal: Negative.   Endocrine: Negative.   Genitourinary: Negative.   Musculoskeletal:  Positive for gait problem.  Skin: Negative.   Allergic/Immunologic: Negative.   Hematological: Negative.   Psychiatric/Behavioral: Negative.   All other systems reviewed and are negative.      Objective:   Physical Exam Not performed as patient was seen via Webex     Assessment & Plan:  1. Decreased functional mobility secondary to left displaced femoral neck fracture status post left total hip replacement anterior approach as well as severely left comminuted distal radius fracture status post ORIF 12/08/2019 as well as forearm laceration repair. Weightbearing as tolerated through left elbow only and weightbearing as tolerated left lower extremity. No left hip abduction.  Most recent CT from 1/27 revealed fx's of left SPR/IPR and left sacrum.  -Continue Home PT and OT. Adivsed patient to call service to ask why follow-up services were not received beyond additional evals. Asked her to call me if she has any issues getting the necessary services.   -Advised that she does require follow-up with her orthopedic surgeon. Provided her with his name, phone number, and address for her to schedule an appointment as soon as possible.  -Reviewed results of XR from last day in CIR and wrist fractures appeared stable on these films.    2. Pain Management: pain is well controlled! This was a big issue for her in CIR. May be well controlled now because she is not moving as much. Advised that she mobilize as much as possible without worsening her pain.  3. Asthma. Continue inhalers as directed. She had an asthma exacerbation on 2/14 for which she went to the ED. Has not had any episodes of SOB since.   4. History of gout. Continue Allopurinol daily.   5. Chronic diastolic congestive heart failure. Continue Lasix 40 mg daily.   6. Constipation: moving bowels regularly.  20 minutes of patient care time were spent during this visit. All questions were encouraged and answered.  Follow  up with me PRN

## 2020-01-31 ENCOUNTER — Other Ambulatory Visit: Payer: Self-pay

## 2020-01-31 ENCOUNTER — Inpatient Hospital Stay (HOSPITAL_COMMUNITY)
Admission: EM | Admit: 2020-01-31 | Discharge: 2020-02-06 | DRG: 871 | Disposition: A | Discharge status: Home Health | Payer: Medicare Other | Attending: Internal Medicine | Admitting: Internal Medicine

## 2020-01-31 ENCOUNTER — Emergency Department (HOSPITAL_COMMUNITY): Payer: Medicare Other

## 2020-01-31 ENCOUNTER — Encounter (HOSPITAL_COMMUNITY): Payer: Self-pay | Admitting: Emergency Medicine

## 2020-01-31 DIAGNOSIS — Z885 Allergy status to narcotic agent status: Secondary | ICD-10-CM | POA: Diagnosis not present

## 2020-01-31 DIAGNOSIS — E782 Mixed hyperlipidemia: Secondary | ICD-10-CM | POA: Diagnosis present

## 2020-01-31 DIAGNOSIS — I5032 Chronic diastolic (congestive) heart failure: Secondary | ICD-10-CM | POA: Diagnosis not present

## 2020-01-31 DIAGNOSIS — E875 Hyperkalemia: Secondary | ICD-10-CM | POA: Diagnosis not present

## 2020-01-31 DIAGNOSIS — Z888 Allergy status to other drugs, medicaments and biological substances status: Secondary | ICD-10-CM | POA: Diagnosis not present

## 2020-01-31 DIAGNOSIS — Z881 Allergy status to other antibiotic agents status: Secondary | ICD-10-CM | POA: Diagnosis not present

## 2020-01-31 DIAGNOSIS — Z20822 Contact with and (suspected) exposure to covid-19: Secondary | ICD-10-CM | POA: Diagnosis present

## 2020-01-31 DIAGNOSIS — J4520 Mild intermittent asthma, uncomplicated: Secondary | ICD-10-CM | POA: Diagnosis present

## 2020-01-31 DIAGNOSIS — E213 Hyperparathyroidism, unspecified: Secondary | ICD-10-CM | POA: Diagnosis present

## 2020-01-31 DIAGNOSIS — Z940 Kidney transplant status: Secondary | ICD-10-CM

## 2020-01-31 DIAGNOSIS — Z7982 Long term (current) use of aspirin: Secondary | ICD-10-CM

## 2020-01-31 DIAGNOSIS — R6520 Severe sepsis without septic shock: Secondary | ICD-10-CM | POA: Diagnosis not present

## 2020-01-31 DIAGNOSIS — R52 Pain, unspecified: Secondary | ICD-10-CM

## 2020-01-31 DIAGNOSIS — H409 Unspecified glaucoma: Secondary | ICD-10-CM | POA: Diagnosis present

## 2020-01-31 DIAGNOSIS — J411 Mucopurulent chronic bronchitis: Secondary | ICD-10-CM | POA: Diagnosis present

## 2020-01-31 DIAGNOSIS — Z8271 Family history of polycystic kidney: Secondary | ICD-10-CM

## 2020-01-31 DIAGNOSIS — Z79891 Long term (current) use of opiate analgesic: Secondary | ICD-10-CM

## 2020-01-31 DIAGNOSIS — I110 Hypertensive heart disease with heart failure: Secondary | ICD-10-CM | POA: Diagnosis present

## 2020-01-31 DIAGNOSIS — I5033 Acute on chronic diastolic (congestive) heart failure: Secondary | ICD-10-CM | POA: Diagnosis present

## 2020-01-31 DIAGNOSIS — E872 Acidosis: Secondary | ICD-10-CM | POA: Diagnosis present

## 2020-01-31 DIAGNOSIS — A419 Sepsis, unspecified organism: Secondary | ICD-10-CM | POA: Diagnosis present

## 2020-01-31 DIAGNOSIS — Z79899 Other long term (current) drug therapy: Secondary | ICD-10-CM

## 2020-01-31 DIAGNOSIS — D849 Immunodeficiency, unspecified: Secondary | ICD-10-CM | POA: Diagnosis present

## 2020-01-31 DIAGNOSIS — I472 Ventricular tachycardia: Secondary | ICD-10-CM | POA: Diagnosis present

## 2020-01-31 DIAGNOSIS — I471 Supraventricular tachycardia: Secondary | ICD-10-CM | POA: Diagnosis present

## 2020-01-31 DIAGNOSIS — J9601 Acute respiratory failure with hypoxia: Secondary | ICD-10-CM | POA: Diagnosis present

## 2020-01-31 DIAGNOSIS — Z96642 Presence of left artificial hip joint: Secondary | ICD-10-CM | POA: Diagnosis present

## 2020-01-31 DIAGNOSIS — R0902 Hypoxemia: Secondary | ICD-10-CM | POA: Diagnosis not present

## 2020-01-31 DIAGNOSIS — K219 Gastro-esophageal reflux disease without esophagitis: Secondary | ICD-10-CM | POA: Diagnosis not present

## 2020-01-31 DIAGNOSIS — E871 Hypo-osmolality and hyponatremia: Secondary | ICD-10-CM | POA: Diagnosis present

## 2020-01-31 DIAGNOSIS — E669 Obesity, unspecified: Secondary | ICD-10-CM | POA: Diagnosis present

## 2020-01-31 DIAGNOSIS — M109 Gout, unspecified: Secondary | ICD-10-CM | POA: Diagnosis present

## 2020-01-31 DIAGNOSIS — F329 Major depressive disorder, single episode, unspecified: Secondary | ICD-10-CM | POA: Diagnosis present

## 2020-01-31 DIAGNOSIS — Z87891 Personal history of nicotine dependence: Secondary | ICD-10-CM

## 2020-01-31 DIAGNOSIS — M1990 Unspecified osteoarthritis, unspecified site: Secondary | ICD-10-CM | POA: Diagnosis present

## 2020-01-31 DIAGNOSIS — R0602 Shortness of breath: Secondary | ICD-10-CM | POA: Insufficient documentation

## 2020-01-31 DIAGNOSIS — Z8249 Family history of ischemic heart disease and other diseases of the circulatory system: Secondary | ICD-10-CM

## 2020-01-31 DIAGNOSIS — M25552 Pain in left hip: Secondary | ICD-10-CM | POA: Diagnosis present

## 2020-01-31 DIAGNOSIS — R609 Edema, unspecified: Secondary | ICD-10-CM | POA: Diagnosis not present

## 2020-01-31 DIAGNOSIS — Z6832 Body mass index (BMI) 32.0-32.9, adult: Secondary | ICD-10-CM

## 2020-01-31 DIAGNOSIS — Z9289 Personal history of other medical treatment: Secondary | ICD-10-CM

## 2020-01-31 DIAGNOSIS — Z7951 Long term (current) use of inhaled steroids: Secondary | ICD-10-CM

## 2020-01-31 DIAGNOSIS — K429 Umbilical hernia without obstruction or gangrene: Secondary | ICD-10-CM | POA: Diagnosis present

## 2020-01-31 DIAGNOSIS — R7989 Other specified abnormal findings of blood chemistry: Secondary | ICD-10-CM | POA: Diagnosis not present

## 2020-01-31 DIAGNOSIS — E785 Hyperlipidemia, unspecified: Secondary | ICD-10-CM | POA: Diagnosis present

## 2020-01-31 DIAGNOSIS — D649 Anemia, unspecified: Secondary | ICD-10-CM | POA: Diagnosis present

## 2020-01-31 DIAGNOSIS — I5031 Acute diastolic (congestive) heart failure: Secondary | ICD-10-CM | POA: Diagnosis not present

## 2020-01-31 DIAGNOSIS — M7989 Other specified soft tissue disorders: Secondary | ICD-10-CM | POA: Diagnosis not present

## 2020-01-31 HISTORY — DX: Heart failure, unspecified: I50.9

## 2020-01-31 LAB — COMPREHENSIVE METABOLIC PANEL
ALT: 8 U/L (ref 0–44)
AST: 14 U/L — ABNORMAL LOW (ref 15–41)
Albumin: 2.5 g/dL — ABNORMAL LOW (ref 3.5–5.0)
Alkaline Phosphatase: 143 U/L — ABNORMAL HIGH (ref 38–126)
Anion gap: 9 (ref 5–15)
BUN: 13 mg/dL (ref 8–23)
CO2: 22 mmol/L (ref 22–32)
Calcium: 9.2 mg/dL (ref 8.9–10.3)
Chloride: 102 mmol/L (ref 98–111)
Creatinine, Ser: 0.92 mg/dL (ref 0.44–1.00)
GFR calc Af Amer: >60 mL/min (ref >60)
GFR calc non Af Amer: >60 mL/min (ref >60)
Glucose, Bld: 136 mg/dL — ABNORMAL HIGH (ref 70–99)
Potassium: 4.4 mmol/L (ref 3.5–5.1)
Sodium: 133 mmol/L — ABNORMAL LOW (ref 135–145)
Total Bilirubin: 1.2 mg/dL (ref 0.3–1.2)
Total Protein: 6.9 g/dL (ref 6.5–8.1)

## 2020-01-31 LAB — POCT I-STAT 7, (LYTES, BLD GAS, ICA,H+H)
Acid-base deficit: 3 mmol/L — ABNORMAL HIGH (ref 0.0–2.0)
Bicarbonate: 20.1 mmol/L (ref 20.0–28.0)
Calcium, Ion: 1.25 mmol/L (ref 1.15–1.40)
HCT: 22 % — ABNORMAL LOW (ref 36.0–46.0)
Hemoglobin: 7.5 g/dL — ABNORMAL LOW (ref 12.0–15.0)
O2 Saturation: 100 %
Patient temperature: 103.7
Potassium: 4.7 mmol/L (ref 3.5–5.1)
Sodium: 132 mmol/L — ABNORMAL LOW (ref 135–145)
TCO2: 21 mmol/L — ABNORMAL LOW (ref 22–32)
pCO2 arterial: 29.6 mmHg — ABNORMAL LOW (ref 32.0–48.0)
pH, Arterial: 7.45 (ref 7.350–7.450)
pO2, Arterial: 187 mmHg — ABNORMAL HIGH (ref 83.0–108.0)

## 2020-01-31 LAB — URINALYSIS, ROUTINE W REFLEX MICROSCOPIC
Bacteria, UA: NONE SEEN
Bilirubin Urine: NEGATIVE
Glucose, UA: NEGATIVE mg/dL
Ketones, ur: NEGATIVE mg/dL
Leukocytes,Ua: NEGATIVE
Nitrite: NEGATIVE
Protein, ur: 30 mg/dL — AB
Specific Gravity, Urine: 1.016 (ref 1.005–1.030)
pH: 5 (ref 5.0–8.0)

## 2020-01-31 LAB — LACTIC ACID, PLASMA: Lactic Acid, Venous: 1.1 mmol/L (ref 0.5–1.9)

## 2020-01-31 LAB — CBC WITH DIFFERENTIAL/PLATELET
Abs Immature Granulocytes: 0.06 10*3/uL (ref 0.00–0.07)
Basophils Absolute: 0 10*3/uL (ref 0.0–0.1)
Basophils Relative: 0 %
Eosinophils Absolute: 0.1 10*3/uL (ref 0.0–0.5)
Eosinophils Relative: 1 %
HCT: 30.3 % — ABNORMAL LOW (ref 36.0–46.0)
Hemoglobin: 8.7 g/dL — ABNORMAL LOW (ref 12.0–15.0)
Immature Granulocytes: 1 %
Lymphocytes Relative: 6 %
Lymphs Abs: 0.6 10*3/uL — ABNORMAL LOW (ref 0.7–4.0)
MCH: 28.2 pg (ref 26.0–34.0)
MCHC: 28.7 g/dL — ABNORMAL LOW (ref 30.0–36.0)
MCV: 98.1 fL (ref 80.0–100.0)
Monocytes Absolute: 0.3 10*3/uL (ref 0.1–1.0)
Monocytes Relative: 3 %
Neutro Abs: 8.1 10*3/uL — ABNORMAL HIGH (ref 1.7–7.7)
Neutrophils Relative %: 89 %
Platelets: 293 10*3/uL (ref 150–400)
RBC: 3.09 MIL/uL — ABNORMAL LOW (ref 3.87–5.11)
RDW: 20.5 % — ABNORMAL HIGH (ref 11.5–15.5)
WBC: 9.2 10*3/uL (ref 4.0–10.5)
nRBC: 0.2 % (ref 0.0–0.2)

## 2020-01-31 LAB — APTT: aPTT: 35 seconds (ref 24–36)

## 2020-01-31 LAB — PROTIME-INR
INR: 1.2 (ref 0.8–1.2)
Prothrombin Time: 15.6 seconds — ABNORMAL HIGH (ref 11.4–15.2)

## 2020-01-31 LAB — PROCALCITONIN: Procalcitonin: 0.41 ng/mL

## 2020-01-31 LAB — POC SARS CORONAVIRUS 2 AG -  ED: SARS Coronavirus 2 Ag: NEGATIVE

## 2020-01-31 MED ORDER — ADENOSINE 6 MG/2ML IV SOLN
INTRAVENOUS | Status: AC
  Administered 2020-01-31: 6 mg
  Filled 2020-01-31: qty 6

## 2020-01-31 MED ORDER — SODIUM CHLORIDE 0.9 % IV SOLN
2.0000 g | Freq: Three times a day (TID) | INTRAVENOUS | Status: DC
  Administered 2020-01-31 – 2020-02-02 (×5): 2 g via INTRAVENOUS
  Filled 2020-01-31 (×5): qty 2

## 2020-01-31 MED ORDER — ONDANSETRON HCL 4 MG/2ML IJ SOLN
4.0000 mg | Freq: Once | INTRAMUSCULAR | Status: AC
  Administered 2020-01-31: 4 mg via INTRAVENOUS
  Filled 2020-01-31: qty 2

## 2020-01-31 MED ORDER — VANCOMYCIN HCL IN DEXTROSE 1-5 GM/200ML-% IV SOLN
1000.0000 mg | INTRAVENOUS | Status: DC
  Administered 2020-02-01: 1000 mg via INTRAVENOUS
  Filled 2020-01-31: qty 200

## 2020-01-31 MED ORDER — ACETAMINOPHEN 500 MG PO TABS
1000.0000 mg | ORAL_TABLET | Freq: Once | ORAL | Status: AC
  Administered 2020-01-31: 1000 mg via ORAL
  Filled 2020-01-31: qty 2

## 2020-01-31 MED ORDER — VANCOMYCIN HCL 1750 MG/350ML IV SOLN
1750.0000 mg | Freq: Once | INTRAVENOUS | Status: AC
  Administered 2020-01-31: 1750 mg via INTRAVENOUS
  Filled 2020-01-31: qty 350

## 2020-01-31 MED ORDER — METRONIDAZOLE IN NACL 5-0.79 MG/ML-% IV SOLN
500.0000 mg | Freq: Once | INTRAVENOUS | Status: AC
  Administered 2020-01-31: 500 mg via INTRAVENOUS
  Filled 2020-01-31: qty 100

## 2020-01-31 NOTE — H&P (Signed)
History and Physical    April RICHWINE MCN:[id number] DOB: [birth year] DOA: 01/31/2020  PCP: [name], MD  Patient coming from: Home  Chief Complaint: Fever, SOB  HPI: April [name] is a 65 y.o. 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  She reports that her symptoms started this morning.  She became more and more short of breath.  She has this issue sometimes and takes her home asthma medications, however, she was not able to get the symptoms under control.  Usually when she cannot get it under control, she will come to the hospital to take steroids.  She was noted by EMS to have saturation to 74% on RA.  She improved with medications to 90% on RA and then to 96% with 2LNC.  She further had an elevated HR with palpitations, fever to 103F which improved with tylenol and tachypnea.  She notes 1 day of productive cough, chills, sneezing, sinus congestion, nausea.  She has had no sick contacts and no contacts with coronavirus.  She is remaining at home and waiting to get her vaccine.  She denied any urinary symptoms, change in urination, chest pain, pain with deep breathing.  She does have chronic skin changes, a small area of soreness on her left buttock and some chronic LE swelling, worse on the left.  She notes that she had a hip replacement in January and also had pelvic fractures which have limited her mobility recently.  She has no headache or vision changes.    ED Course: In the ED, she was found to be Febrile with an elevated HR and RR with hypoxia.  She was noted to have a normal WBC.  CXR showed only pulmonary edema, but no obvious pneumonia.  She may have had a bronchitis.  Blood cultures and urine culture were obtained.  She was started on antibiotics for an unknown source in an immunocompromised person.  Initial COVID Ag testing is negative.   Review of Systems: As  per HPI otherwise 10 point review of systems negative.    Past Medical History:  Diagnosis Date  . Arthritis   . Asthma   . Depression   . GERD (gastroesophageal reflux disease)   . Hyperlipidemia   . Hyperparathyroidism   . Polycystic kidney   . PONV (postoperative nausea and vomiting)     Past Surgical History:  Procedure Laterality Date  . 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 BILATERAL OOPHORECTOMY    . CARPAL TUNNEL RELEASE  2000  . CESAREAN SECTION    . INSERTION OF DIALYSIS CATHETER  03/31/2012   Procedure: INSERTION OF DIALYSIS CATHETER;  Surgeon: Angelia Mould, MD;  Location: [place];  Service: Vascular;  Laterality: N/A;  insertion of dialysis catheter right internal jugular vein  . 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 ORIF TIBIA FRACTURE Left 12/23/2018   Procedure: OPEN REDUCTION INTERNAL FIXATION (ORIF) TIBIA FRACTURE;  Surgeon: Shona Needles, MD;  Location: [place];  Service: Orthopedics;  Laterality: Left;  . ORIF TIBIA PLATEAU Right 12/23/2018   Procedure: OPEN REDUCTION INTERNAL FIXATION (ORIF) TIBIAL PLATEAU;  Surgeon: Shona Needles, MD;  Location: [place];  Service: Orthopedics;  Laterality: Right;  . ORIF WRIST FRACTURE Left 12/08/2019   Procedure: OPEN REDUCTION INTERNAL FIXATION (ORIF) WRIST FRACTURE, REPAIR LACERATION LEFT FOREARM;  Surgeon: Dorna Leitz, MD;  Location: [facility];  Service: Orthopedics;  Laterality: Left;  . TONSILLECTOMY  1967  . TOTAL HIP ARTHROPLASTY Left 12/08/2019   Procedure: TOTAL HIP ARTHROPLASTY ANTERIOR APPROACH;  Surgeon: Dorna Leitz, MD;  Location: [facility];  Service: Orthopedics;  Laterality: Left;  . TUBAL LIGATION  2010   Reviewed with patient.   reports that she quit smoking about 21 years ago. Her smoking use included cigarettes. She has a 3.75 pack-year smoking history. She has never used smokeless tobacco. She reports that she does not drink alcohol or use drugs.  Allergies  Allergen Reactions  .  Infed [Iron Dextran] Other (See Comments)    Chest tightness  . Pentamidine Itching, Shortness Of Breath and Swelling  . Erythromycin [Erythromycin] Other (See Comments)    Mouth Ulcers  . Iohexol Other (See Comments)    Per patient "she has had a kidney transplant and should never have contrast"  . Oxycodone Nausea Only and Nausea And Vomiting  . Erythromycin Rash    Causes breakout in mouth  . Ultram [Tramadol Hcl] Anxiety   Reviewed with patient.  Family History  Problem Relation Age of Onset  . Hypertension Mother   . Heart disease Father        CABG history  . Polycystic kidney disease Father   . Polycystic kidney disease Brother     Prior to Admission medications   Medication Sig Start Date End Date Taking? Authorizing Provider  albuterol (VENTOLIN HFA) 108 (90 Base) MCG/ACT inhaler Inhale 2 puffs into the lungs every 4 (four) hours as needed for wheezing or shortness of breath. 01/04/20   Angiulli, Lavon Paganini, PA-C  allopurinol (ZYLOPRIM) 300 MG tablet Take 1 tablet (300 mg total) by mouth daily. 01/04/20   Angiulli, Lavon Paganini, PA-C  aspirin EC 325 MG EC tablet Take 1 tablet (325 mg total) by mouth daily with breakfast. 01/05/20   Angiulli, Lavon Paganini, PA-C  brimonidine (ALPHAGAN P) 0.1 % SOLN Place 1 drop 2 (two) times daily into the left eye.    [provider]  calcium carbonate (TUMS - DOSED IN MG ELEMENTAL CALCIUM) 500 MG chewable tablet Chew 2 tablets (400 mg of elemental calcium total) by mouth 4 (four) times daily as needed for indigestion or heartburn. 01/04/20   Angiulli, Lavon Paganini, PA-C  cholecalciferol (VITAMIN D3) 25 MCG (1000 UNIT) tablet Take 1 tablet (1,000 Units total) by mouth daily. 01/04/20   Angiulli, Lavon Paganini, PA-C  cycloSPORINE (RESTASIS) 0.05 % ophthalmic emulsion Place 1 drop into both eyes 2 (two) times daily. 01/04/20   Angiulli, Lavon Paganini, PA-C  docusate sodium (COLACE) 100 MG capsule Take 1 capsule (100 mg total) by mouth 2 (two) times daily. 12/13/19    Shelly Coss, MD  dorzolamide (TRUSOPT) 2 % ophthalmic solution Place 1 drop into both eyes daily. 06/25/19   [provider]  FLUoxetine (PROZAC) 40 MG capsule Take 1 capsule (40 mg total) by mouth daily. 01/04/20   Angiulli, Lavon Paganini, PA-C  gabapentin (NEURONTIN) 100 MG capsule Take 200 mg by mouth 3 (three) times daily.    [provider]  HYDROcodone-acetaminophen (NORCO/VICODIN) 5-325 MG tablet Take 1 tablet by mouth every 4 (four) hours as needed for severe pain. 01/04/20   Angiulli, Lavon Paganini, PA-C  magnesium oxide (MAG-OX) 400 MG tablet Take 1 tablet (400 mg total) by mouth 2 (two) times daily. 01/04/20   Angiulli, Lavon Paganini, PA-C  methocarbamol (ROBAXIN) 500 MG tablet Take 1 tablet (500 mg total) by mouth  4 (four) times daily. 01/04/20   Angiulli, Lavon Paganini, PA-C  mycophenolate (MYFORTIC) 180 MG EC tablet Take 180 mg by mouth 2 (two) times daily.    [provider]  pantoprazole (PROTONIX) 40 MG tablet Take 1 tablet (40 mg total) by mouth 2 (two) times daily. 01/04/20   Angiulli, Lavon Paganini, PA-C  polyethylene glycol (MIRALAX / GLYCOLAX) 17 g packet Take 17 g by mouth daily as needed for mild constipation. 12/13/19   Shelly Coss, MD  PROGRAF 1 MG capsule Take 1 mg by mouth daily. 03/22/19   [provider]  saccharomyces boulardii (FLORASTOR) 250 MG capsule Take 1 capsule (250 mg total) by mouth 2 (two) times daily. 01/04/20   Angiulli, Lavon Paganini, PA-C  SYMBICORT 160-4.5 MCG/ACT inhaler Inhale 2 puffs into the lungs 2 (two) times daily. 01/04/20   Angiulli, Lavon Paganini, PA-C  tacrolimus (PROGRAF) 0.5 MG capsule Take 0.5 mg by mouth at bedtime. Takes brand name Prograf    [provider]  TRAVATAN Z 0.004 % SOLN ophthalmic solution Place 1 drop into both eyes at bedtime. 07/03/16   [provider]  vitamin B-12 (CYANOCOBALAMIN) 100 MCG tablet Take 100 mcg by mouth daily.    [provider]    Physical Exam: Vitals:   01/31/20 2045 01/31/20  2100 01/31/20 2112 01/31/20 2115  BP: (!) 118/56 115/64  (!) 142/58  Pulse: (!) 107 (!) 107 (!) 191 (!) 110  Resp: (!) 26 (!) 30 (!) 28 18  Temp:      TempSrc:      SpO2: 96% 97% 96% 96%  Weight:      Height:        Constitutional: Lying in bed, appears acutely ill, breathing quickly Eyes:  lids and conjunctivae normal, EOMI, no icterus ENMT: Mucous membranes are dry. Posterior pharynx clear of any exudate or lesions. Normal dentition.  Neck: normal, supple Respiratory: Crackles at bases, otherwise clear and without wheezing or rales.  She is using some accessory muscles and RR is elevated.  Cardiovascular: Tachycardic, regular.  She has non pitting edema of both legs, worse on the left Abdomen: No tenderness.  Transplanted kidney present in right lower quadrant with small nodular area over right lateral pole (no open skin, no drainage, no pain).  She had an umbilical hernia which is reducible.  She has +BS, NT to palpation Musculoskeletal: no clubbing / cyanosis.  She has decreased muscle tone in the arms and legs. She has a retained fistula in the left upper arm with a palpable thrill, non tender to palpation Skin: She has chronic skin changes on her hands and bruising.  She has dark chronic skin changes on the left lower shin which she reports is related to a fracture which never healed.  She has an erythematous patch on the left buttock which is warm and has an area of healing induration centrally.  The skin overlying her transplant is normal appearing, no rash or erythema.   Neurologic: Grossly intact.  She is somewhat lethargic given acute illness Psychiatric: Normal judgment and insight.   Labs on Admission: I have personally reviewed following labs and imaging studies  CBC: Recent Labs  Lab 01/31/20 2007 01/31/20 2229  WBC 9.2  --   NEUTROABS 8.1*  --   HGB 8.7* 7.5*  HCT 30.3* 22.0*  MCV 98.1  --   PLT 293  --    Basic Metabolic Panel: Recent Labs  Lab 01/31/20 2007  01/31/20 2229  NA  133* 132*  K 4.4 4.7  CL 102  --   CO2 22  --   GLUCOSE 136*  --   BUN 13  --   CREATININE 0.92  --   CALCIUM 9.2  --    GFR: Estimated Creatinine Clearance: 62.2 mL/min (by C-G formula based on SCr of 0.92 mg/dL). Liver Function Tests: Recent Labs  Lab 01/31/20 2007  AST 14*  ALT 8  ALKPHOS 143*  BILITOT 1.2  PROT 6.9  ALBUMIN 2.5*   No results for input(s): LIPASE, AMYLASE in the last 168 hours. No results for input(s): AMMONIA in the last 168 hours. Coagulation Profile: Recent Labs  Lab 01/31/20 2007  INR 1.2   Cardiac Enzymes: No results for input(s): CKTOTAL, CKMB, CKMBINDEX, TROPONINI in the last 168 hours. BNP (last 3 results) No results for input(s): PROBNP in the last 8760 hours. HbA1C: No results for input(s): HGBA1C in the last 72 hours. CBG: No results for input(s): GLUCAP in the last 168 hours. Lipid Profile: No results for input(s): CHOL, HDL, LDLCALC, TRIG, CHOLHDL, LDLDIRECT in the last 72 hours. Thyroid Function Tests: No results for input(s): TSH, T4TOTAL, FREET4, T3FREE, THYROIDAB in the last 72 hours. Anemia Panel: No results for input(s): VITAMINB12, FOLATE, FERRITIN, TIBC, IRON, RETICCTPCT in the last 72 hours. Urine analysis:    Component Value Date/Time   COLORURINE YELLOW 01/31/2020 2015   APPEARANCEUR CLEAR 01/31/2020 2015   LABSPEC 1.016 01/31/2020 2015   PHURINE 5.0 01/31/2020 2015   GLUCOSEU NEGATIVE 01/31/2020 2015   HGBUR MODERATE (A) 01/31/2020 2015   BILIRUBINUR NEGATIVE 01/31/2020 2015   [place] 01/31/2020 2015   PROTEINUR 30 (A) 01/31/2020 2015   UROBILINOGEN 0.2 05/15/2013 0733   NITRITE NEGATIVE 01/31/2020 2015   LEUKOCYTESUR NEGATIVE 01/31/2020 2015    Radiological Exams on Admission: DG Chest Port 1 View  Result Date: 01/31/2020 CLINICAL DATA:  Dyspnea. EXAM: PORTABLE CHEST 1 VIEW COMPARISON:  January 07, 2020. FINDINGS: Inspiration remains shallow. There is stable cardiomegaly with  central pulmonary vascular congestion, trace left pleural effusion and mild pulmonary edema. Telemetry leads preclude evaluation of the right lateral costophrenic angle for any residual right pleural effusion. Trace fluid continues to layer along the minor fissure. Bilateral infrahilar streaky consolidation probably represents bronchitis. There is no convincing evidence for pneumonia. There is no pneumothorax. Diffuse bone demineralization and mild skeletal degenerative changes are present. There are telemetry leads. IMPRESSION: Shallow inspiration with stable cardiomegaly, mild pulmonary edema and trace bilateral pleural effusion. Streaky infrahilar consolidation, probably a nonspecific bronchitis. No pneumonia. Electronically Signed   By: Revonda Humphrey   On: 01/31/2020 21:17    EKG: Independently reviewed. Sinus tachycardia, no apparent ST changes, artifact in baseline  Assessment/Plan Sepsis in a transplant recipient, source unknown Acute respiratory failure with hypoxia  - Leukocytosis with a neutrophil predominance noted - Sources include lung, skin, urinary, implanted material (fistula) - Though this could certainly be COVID related, she does not have a clear CXR which is consistent with viral pneumonia, she does not have lymphopenia and no contacts.  She is at high risk for a bacterial infection given her immunosuppresion, and based on chart review, she has had recurrent mucopurulent bronchitis in the past - Check PCT - Trend Lactic acid - Cefepime, metronidazole, vancomycin started given unknown source - Follow BC and UC - Progressive unit, telemetry - IVF with NS at 100cc/hr - Oxygen to keep O2 sats > 90% - Continue asthma treatment, hold off on steroids given  acute infection - Tylenol for fever - In an immunocompromised patient, it would be prudent to get ID involved early, consider consult in the AM - Initial COVID testing negative in ED, follow up PCR testing sent  PCKD s/p  transplant - Continue immunosuppressive medications - Follow up cultures; she is at risk for fungal infections and concomitant viral infections like JC virus and BK virus.  It is reassuring that her renal function is stable.     Asthma, mild intermittent - Continue ICS/LABA - Continue prn albuterol    GERD (gastroesophageal reflux disease) - Continue PPI    Chronic diastolic CHF (congestive heart failure) Hypoalbuminemia - Last TTE in may showed indeterminate diastolic parameters - She has some mild swelling and crackles in the bases - If respiratory status worsens, would consider a dose of lasix to see if this improves oxygenation  Bronchitis, mucopurulent recurrent  - Per chart review from 2015 by Dr. Gwenette Greet:  The patient has no evidence for bronchiectasis on her high-resolution CT, and there is no evidence for hypogammaglobulinemia. One possibility for her recurrent pulmonary symptoms/infection is that it may be related to her aggressive immunosuppressive regimen. I have asked her to discuss this possibility with her transplant team. Her pulmonary function studies are not overly impressive, but there is the suggestion of subtle airflow obstruction by her flow volume loop. Her history is suggestive of recurrent asthmatic bronchitis, and therefore I would like to try her on a LABA/ICS to see if that makes a difference. I will use an inhaler with a low inhaled steroid dose since she is on low-dose prednisone for her transplant. I would also like to do a scan of her sinuses to make sure that is not the source of her recurrent infections/symptoms. If this is unremarkable, I can really find no source for her recurrent symptoms, and we need to strongly consider the role that her immunosuppression may be playing.  - I am curious if this may be another recurrence related to her transplant medications - As above, it would be prudent to consult ID in the AM  Mild hyponatremia - Trend, chronic issue  and not largely different from previous.             DVT prophylaxis: Lovenox  Code Status: Full  Disposition Plan: Admit for antibiotics  Consults called: Would likely call ID in the AM  Admission status: Progressive, INP    Gilles Chiquito MD [facility]   If 7PM-7AM, please contact night-coverage [email or web address] Use universal [place] password for that web site. If you do not have the password, please call the hospital operator.  01/31/2020, 10:59 PM

## 2020-01-31 NOTE — Progress Notes (Signed)
Pharmacy Antibiotic Note  April [name] is a 64 y.o. female admitted on 01/31/2020 with sepsis. Pharmacy has been consulted for Cefepime and Vancomycin dosing. Patient has a HX of kidney transplant and is on anti-rejection therapy.   Height: 5\' 2"  (157.5 cm) Weight: 176 lb 5.9 oz (80 kg) IBW/kg (Calculated) : 50.1  Temp (24hrs), Avg:103.7 F (39.8 C), Min:103.7 F (39.8 C), Max:103.7 F (39.8 C)  Recent Labs  Lab 01/31/20 2007  WBC 9.2  CREATININE 0.92  LATICACIDVEN 1.1    Estimated Creatinine Clearance: 62.2 mL/min (by C-G formula based on SCr of 0.92 mg/dL).    Allergies  Allergen Reactions  . Infed [Iron Dextran] Other (See Comments)    Chest tightness  . Pentamidine Itching, Shortness Of Breath and Swelling  . Erythromycin [Erythromycin] Other (See Comments)    Mouth Ulcers  . Iohexol Other (See Comments)    Per patient "she has had a kidney transplant and should never have contrast"  . Oxycodone Nausea Only and Nausea And Vomiting  . Erythromycin Rash    Causes breakout in mouth  . Ultram [Tramadol Hcl] Anxiety    Antimicrobials this admission: 3/10 Cefepime >>  3/10 Vancomycin >>   Dose adjustments this admission:   Microbiology results: Pending   Plan:  - Cefepime 2g IV q8hr  - Vancomycin 1750mg   IV x 1 dose  - Followed by Vancomycin 1000mg  IV q24hr - Est Calc AUC 543 - Monitor patients renal function and urine output   Thank you for allowing pharmacy to be a part of this patient's care.  Duanne Limerick PharmD. BCPS 01/31/2020 9:56 PM

## 2020-01-31 NOTE — ED Provider Notes (Signed)
[facility] EMERGENCY DEPARTMENT Provider Note   CSN: [id number] Arrival date & time: 01/31/20  1939     History Chief Complaint  Patient presents with  . Respiratory Distress    April [name] is a 64 y.o. female.  HPI      April [name] is a 64 y.o. female, with a history of arthritis, asthma, GERD, hyperlipidemia, presenting to the ED with shortness of breath and cough beginning today.  Accompanied by subjective fever and nausea. She states she felt normal yesterday. Upon EMS arrival, patient had room air SPO2 at 74%.  She received 2 g magnesium, 125 mg Solu-Medrol, albuterol nebulizer, and was placed on nonrebreather.  Patient underwent left total hip arthroplasty December 08, 2019 performed by Dr. Berenice Primas.  She was then in a rehab facility for a few weeks before being released and sent home February 12.  She denies chest pain, vomiting, diarrhea, abdominal pain, increased hip pain, urinary symptoms, syncope, flank/back pain, recent falls/trauma, or any other complaints.    Past Medical History:  Diagnosis Date  . Arthritis   . Asthma   . Depression   . GERD (gastroesophageal reflux disease)   . Hyperlipidemia   . Hyperparathyroidism   . Polycystic kidney   . PONV (postoperative nausea and vomiting)     Patient Active Problem List   Diagnosis Date Noted  . Fracture of left superior pubic ramus (HCC)   . Pain   . Anxiety state   . Left displaced femoral neck fracture ([place]) 12/15/2019  . Status post total hip replacement, left   . Post-op pain   . Acute blood loss anemia   . Polycystic kidney   . Closed left hip fracture ([place]) 12/07/2019  . Left wrist fracture 12/07/2019  . Medication management 09/22/2019  . Physical deconditioning 09/22/2019  . Hyponatremia 06/29/2019  . Acute respiratory failure with hypoxia ([place]) 04/19/2019  . Acute on chronic diastolic CHF (congestive heart failure) ([place]) 04/19/2019  . Bilateral closed proximal  tibial fracture 12/21/2018  . Closed right ankle fracture 12/21/2018  . Fracture 12/21/2018  . Lower urinary tract infectious disease 10/03/2018  . Asthma, chronic, unspecified asthma severity, with acute exacerbation 10/03/2018  . SIRS (systemic inflammatory response syndrome) ([place]) 10/03/2018  . Nausea vomiting and diarrhea 10/02/2018  . Chronic diastolic CHF (congestive heart failure) ([place]) 10/01/2017  . Influenza B 10/01/2017  . Persistent asthma with status asthmaticus   . Pneumonia 07/15/2016  . Asthma, mild intermittent 07/15/2016  . GERD (gastroesophageal reflux disease) 07/15/2016  . Renal transplant recipient 07/15/2016  . Hyperlipidemia 07/15/2016  . Depression 07/15/2016  . Bronchitis, mucopurulent recurrent ([place]) 08/08/2014  . Chronic cough 07/24/2014  . End stage renal disease ([place]) 03/23/2012  . 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 BILATERAL OOPHORECTOMY    . CARPAL TUNNEL RELEASE  2000  . CESAREAN SECTION    . INSERTION OF DIALYSIS CATHETER  03/31/2012   Procedure: INSERTION OF DIALYSIS CATHETER;  Surgeon: Angelia Mould, MD;  Location: [facility];  Service: Vascular;  Laterality: N/A;  insertion of dialysis catheter right internal jugular vein  . 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 ORIF TIBIA FRACTURE Left 12/23/2018   Procedure: OPEN REDUCTION INTERNAL FIXATION (ORIF) TIBIA FRACTURE;  Surgeon: Shona Needles, MD;  Location: [facility];  Service: Orthopedics;  Laterality: Left;  . ORIF TIBIA PLATEAU Right 12/23/2018   Procedure: OPEN REDUCTION INTERNAL FIXATION (ORIF) TIBIAL PLATEAU;  Surgeon: Shona Needles, MD;  Location: [facility];  Service: Orthopedics;  Laterality: Right;  . ORIF WRIST FRACTURE Left 12/08/2019   Procedure: OPEN REDUCTION INTERNAL FIXATION (ORIF) WRIST FRACTURE, REPAIR LACERATION LEFT  FOREARM;  Surgeon: Dorna Leitz, MD;  Location: [facility];  Service: Orthopedics;  Laterality: Left;  . TONSILLECTOMY  1967  . TOTAL HIP ARTHROPLASTY Left 12/08/2019   Procedure: TOTAL HIP ARTHROPLASTY ANTERIOR APPROACH;  Surgeon: Dorna Leitz, MD;  Location: [facility];  Service: Orthopedics;  Laterality: Left;  . TUBAL LIGATION  2010     OB History   No obstetric history on file.     Family History  Problem Relation Age of Onset  . Hypertension Mother   . Heart disease Father        CABG history  . Polycystic kidney disease Father   . Polycystic kidney disease Brother     Social History   Tobacco Use  . Smoking status: Former Smoker    Packs/day: 0.25    Years: 15.00    Pack years: 3.75    Types: Cigarettes    Quit date: 03/22/1998    Years since quitting: 21.8  . Smokeless tobacco: Never Used  Substance Use Topics  . Alcohol use: No    Comment: social  . Drug use: No    Home Medications Prior to Admission medications   Medication Sig Start Date End Date Taking? Authorizing Provider  albuterol (VENTOLIN HFA) 108 (90 Base) MCG/ACT inhaler Inhale 2 puffs into the lungs every 4 (four) hours as needed for wheezing or shortness of breath. 01/04/20   Angiulli, Lavon Paganini, PA-C  allopurinol (ZYLOPRIM) 300 MG tablet Take 1 tablet (300 mg total) by mouth daily. 01/04/20   Angiulli, Lavon Paganini, PA-C  aspirin EC 325 MG EC tablet Take 1 tablet (325 mg total) by mouth daily with breakfast. 01/05/20   Angiulli, Lavon Paganini, PA-C  brimonidine (ALPHAGAN P) 0.1 % SOLN Place 1 drop 2 (two) times daily into the left eye.    [provider]  calcium carbonate (TUMS - DOSED IN MG ELEMENTAL CALCIUM) 500 MG chewable tablet Chew 2 tablets (400 mg of elemental calcium total) by mouth 4 (four) times daily as needed for indigestion or heartburn. 01/04/20   Angiulli, Lavon Paganini, PA-C  cholecalciferol (VITAMIN D3) 25 MCG (1000 UNIT) tablet Take 1 tablet (1,000 Units total) by mouth daily. 01/04/20   Angiulli,  Lavon Paganini, PA-C  cycloSPORINE (RESTASIS) 0.05 % ophthalmic emulsion Place 1 drop into both eyes 2 (two) times daily. 01/04/20   Angiulli, Lavon Paganini, PA-C  docusate sodium (COLACE) 100 MG capsule Take 1 capsule (100 mg total) by mouth 2 (two) times daily. 12/13/19   Shelly Coss, MD  dorzolamide (TRUSOPT) 2 % ophthalmic solution Place 1 drop into both eyes daily. 06/25/19   [provider]  FLUoxetine (PROZAC) 40 MG capsule Take 1 capsule (40 mg total) by mouth daily. 01/04/20   Angiulli, Lavon Paganini, PA-C  gabapentin (NEURONTIN) 100 MG capsule Take 200 mg by mouth 3 (three) times daily.    [provider]  HYDROcodone-acetaminophen (NORCO/VICODIN) 5-325 MG tablet Take 1 tablet by mouth every 4 (four) hours as needed for severe pain. 01/04/20   Angiulli, Lavon Paganini, PA-C  magnesium oxide (MAG-OX) 400 MG tablet Take 1 tablet (400 mg total) by mouth 2 (two) times daily. 01/04/20  Angiulli, Lavon Paganini, PA-C  methocarbamol (ROBAXIN) 500 MG tablet Take 1 tablet (500 mg total) by mouth 4 (four) times daily. 01/04/20   Angiulli, Lavon Paganini, PA-C  mycophenolate (MYFORTIC) 180 MG EC tablet Take 180 mg by mouth 2 (two) times daily.    [provider]  pantoprazole (PROTONIX) 40 MG tablet Take 1 tablet (40 mg total) by mouth 2 (two) times daily. 01/04/20   Angiulli, Lavon Paganini, PA-C  polyethylene glycol (MIRALAX / GLYCOLAX) 17 g packet Take 17 g by mouth daily as needed for mild constipation. 12/13/19   Shelly Coss, MD  PROGRAF 1 MG capsule Take 1 mg by mouth daily. 03/22/19   [provider]  saccharomyces boulardii (FLORASTOR) 250 MG capsule Take 1 capsule (250 mg total) by mouth 2 (two) times daily. 01/04/20   Angiulli, Lavon Paganini, PA-C  SYMBICORT 160-4.5 MCG/ACT inhaler Inhale 2 puffs into the lungs 2 (two) times daily. 01/04/20   Angiulli, Lavon Paganini, PA-C  tacrolimus (PROGRAF) 0.5 MG capsule Take 0.5 mg by mouth at bedtime. Takes brand name Prograf    [provider]  TRAVATAN Z  0.004 % SOLN ophthalmic solution Place 1 drop into both eyes at bedtime. 07/03/16   [provider]  vitamin B-12 (CYANOCOBALAMIN) 100 MCG tablet Take 100 mcg by mouth daily.    [provider]    Allergies    Ardyth Harps [iron dextran], Pentamidine, Erythromycin [erythromycin], Iohexol, Oxycodone, Erythromycin, and Ultram [tramadol hcl]  Review of Systems   Review of Systems  Constitutional: Positive for fever.  Respiratory: Positive for cough and shortness of breath.   Cardiovascular: Negative for chest pain.  Gastrointestinal: Positive for nausea. Negative for abdominal pain, blood in stool, diarrhea and vomiting.  Genitourinary: Negative for dysuria, flank pain and frequency.  Musculoskeletal: Negative for back pain.  Neurological: Negative for syncope.  All other systems reviewed and are negative.   Physical Exam Updated Vital Signs BP (!) 157/73 (BP Location: Right Arm)   Pulse (!) 116   Temp (!) 103.7 F (39.8 C) (Rectal)   Resp (!) 28   Ht 5\' 2"  (1.575 m)   Wt 80 kg   SpO2 92%   BMI 32.26 kg/m   Physical Exam Vitals and nursing note reviewed.  Constitutional:      General: She is not in acute distress.    Appearance: She is well-developed. She is ill-appearing. She is not diaphoretic.  HENT:     Head: Normocephalic and atraumatic.     Mouth/Throat:     Mouth: Mucous membranes are moist.     Pharynx: Oropharynx is clear.  Eyes:     Conjunctiva/sclera: Conjunctivae normal.  Cardiovascular:     Rate and Rhythm: Regular rhythm. Tachycardia present.     Pulses: Normal pulses.          Radial pulses are 2+ on the right side and 2+ on the left side.       Posterior tibial pulses are 2+ on the right side and 2+ on the left side.     Heart sounds: Normal heart sounds.     Comments: Tactile temperature in the extremities appropriate and equal bilaterally. Pulmonary:     Effort: Tachypnea present.     Breath sounds: Normal breath sounds.     Comments:  Some mild tachypnea.  Able to answer questions with full sentences. Abdominal:     Palpations: Abdomen is soft.     Tenderness: There is no abdominal tenderness. There is no  guarding.  Musculoskeletal:     Cervical back: Neck supple.     Right lower leg: No edema.     Left lower leg: No edema.     Comments: Passive range of motion intact in the left hip without pain. No noted swelling, increased warmth, or erythema. No tenderness throughout the left lower extremity.  Lymphadenopathy:     Cervical: No cervical adenopathy.  Skin:    General: Skin is warm and dry.  Neurological:     Mental Status: She is alert.     Comments: Sensation grossly intact to light touch in each of the extremities. Appropriate motor function intact in each of the extremities.  Psychiatric:        Mood and Affect: Mood and affect normal.        Speech: Speech normal.        Behavior: Behavior normal.     ED Results / Procedures / Treatments   Labs (all labs ordered are listed, but only abnormal results are displayed) Labs Reviewed  COMPREHENSIVE METABOLIC PANEL - Abnormal; Notable for the following components:      Result Value   Sodium 133 (*)    Glucose, Bld 136 (*)    Albumin 2.5 (*)    AST 14 (*)    Alkaline Phosphatase 143 (*)    All other components within normal limits  CBC WITH DIFFERENTIAL/PLATELET - Abnormal; Notable for the following components:   RBC 3.09 (*)    Hemoglobin 8.7 (*)    HCT 30.3 (*)    MCHC 28.7 (*)    RDW 20.5 (*)    Neutro Abs 8.1 (*)    Lymphs Abs 0.6 (*)    All other components within normal limits  PROTIME-INR - Abnormal; Notable for the following components:   Prothrombin Time 15.6 (*)    All other components within normal limits  URINALYSIS, ROUTINE W REFLEX MICROSCOPIC - Abnormal; Notable for the following components:   Hgb urine dipstick MODERATE (*)    Protein, ur 30 (*)    All other components within normal limits  CULTURE, BLOOD (ROUTINE X 2)  CULTURE,  BLOOD (ROUTINE X 2)  URINE CULTURE  SARS CORONAVIRUS 2 (TAT 6-24 HRS)  LACTIC ACID, PLASMA  APTT  PROCALCITONIN  LACTIC ACID, PLASMA  BRAIN NATRIURETIC PEPTIDE  BLOOD GAS, ARTERIAL  POC SARS CORONAVIRUS 2 AG -  ED   Hemoglobin  Date Value Ref Range Status  01/31/2020 7.5 (L) 12.0 - 15.0 g/dL Final  01/31/2020 8.7 (L) 12.0 - 15.0 g/dL Final  01/07/2020 8.6 (L) 12.0 - 15.0 g/dL Final  01/04/2020 8.4 (L) 12.0 - 15.0 g/dL Final   HGB  Date Value Ref Range Status  12/29/2010 10.6 (L) 11.6 - 15.9 g/dL Final    EKG EKG Interpretation  Date/Time:  Wednesday January 31 2020 19:42:24 EST Ventricular Rate:  117 PR Interval:    QRS Duration: 103 QT Interval:  322 QTC Calculation: 450 R Axis:   82 Text Interpretation: Sinus tachycardia Borderline right axis deviation Low voltage, extremity and precordial leads Baseline wander in lead(s) V2 Artifact Abnormal ECG Confirmed by Carmin Muskrat [phone]) on 01/31/2020 7:49:13 PM   Radiology DG Chest Port 1 View  Result Date: 01/31/2020 CLINICAL DATA:  Dyspnea. EXAM: PORTABLE CHEST 1 VIEW COMPARISON:  January 07, 2020. FINDINGS: Inspiration remains shallow. There is stable cardiomegaly with central pulmonary vascular congestion, trace left pleural effusion and mild pulmonary edema. Telemetry leads preclude evaluation of the right lateral costophrenic  angle for any residual right pleural effusion. Trace fluid continues to layer along the minor fissure. Bilateral infrahilar streaky consolidation probably represents bronchitis. There is no convincing evidence for pneumonia. There is no pneumothorax. Diffuse bone demineralization and mild skeletal degenerative changes are present. There are telemetry leads. IMPRESSION: Shallow inspiration with stable cardiomegaly, mild pulmonary edema and trace bilateral pleural effusion. Streaky infrahilar consolidation, probably a nonspecific bronchitis. No pneumonia. Electronically Signed   By: Revonda Humphrey   On:  01/31/2020 21:17    Procedures .Critical Care Performed by: Lorayne Bender, PA-C Authorized by: Lorayne Bender, PA-C   Critical care provider statement:    Critical care time (minutes):  35   Critical care time was exclusive of:  Separately billable procedures and treating other patients   Critical care was necessary to treat or prevent imminent or life-threatening deterioration of the following conditions:  Respiratory failure   Critical care was time spent personally by me on the following activities:  Ordering and performing treatments and interventions, ordering and review of laboratory studies, ordering and review of radiographic studies, pulse oximetry, re-evaluation of patient's condition, review of old charts, obtaining history from patient or surrogate, examination of patient, evaluation of patient's response to treatment, discussions with consultants and development of treatment plan with patient or surrogate   I assumed direction of critical care for this patient from another provider in my specialty: no     (including critical care time)  Medications Ordered in ED Medications  metroNIDAZOLE (FLAGYL) IVPB 500 mg (500 mg Intravenous New Bag/Given 01/31/20 2206)  ceFEPIme (MAXIPIME) 2 g in sodium chloride 0.9 % 100 mL IVPB (2 g Intravenous New Bag/Given 01/31/20 2207)  vancomycin (VANCOREADY) IVPB 1750 mg/350 mL (has no administration in time range)    Followed by  vancomycin (VANCOCIN) IVPB 1000 mg/200 mL premix (has no administration in time range)  acetaminophen (TYLENOL) tablet 1,000 mg (1,000 mg Oral Given 01/31/20 2024)  adenosine (ADENOCARD) 6 MG/2ML injection (6 mg  Given 01/31/20 2118)  ondansetron (ZOFRAN) injection 4 mg (4 mg Intravenous Given 01/31/20 2143)    ED Course  I have reviewed the triage vital signs and the nursing notes.  Pertinent labs & imaging results that were available during my care of the patient were reviewed by me and considered in my medical decision  making (see chart for details).  Clinical Course as of Jan 31 2311  Wed Jan 31, 2020  2113 RN reports patient in SVT.  Noted to have heart rate of 180-190.  Confirmed SVT on the monitor.  6 mg adenosine administered.  Patient returned to heart rate around 110.   [SJ]  2207 Spoke with Dr. Daryll Drown, hospitalist.  Agrees to admit the patient.   [SJ]    Clinical Course User Index [SJ] Jathen Sudano, Helane Gunther, PA-C   MDM Rules/Calculators/A&P                      Patient presents with shortness of breath and cough beginning today.  She is ill-appearing, febrile, tachycardic, tachypneic.  She meets SIRS criteria, however, Covid is certainly high on the differential and therefore we decided to wait on the patient's POC Covid test and chest x-ray prior to activating code sepsis.  PE was a consideration, however, thought less likely due to the patient's high fever and presence of cough without hemoptysis. Septic arthritis of the patient's left hip was also considered, however, thought less likely as patient has no increased pain  and exam is not suggestive. I personally reviewed and interpreted the patient's labs and radiological studies.  Patient admitted for further management.  Findings and plan of care discussed with Adrian Prows, MD. Dr. Vanita Panda personally evaluated and examined this patient.  Vitals:   01/31/20 2045 01/31/20 2100 01/31/20 2112 01/31/20 2115  BP: (!) 118/56 115/64  (!) 142/58  Pulse: (!) 107 (!) 107 (!) 191 (!) 110  Resp: (!) 26 (!) 30 (!) 28 18  Temp:      TempSrc:      SpO2: 96% 97% 96% 96%  Weight:      Height:         Final Clinical Impression(s) / ED Diagnoses Final diagnoses:  Shortness of breath  Hypoxia    Rx / DC Orders ED Discharge Orders    None       Layla Maw 01/31/20 2312    Carmin Muskrat, MD 02/01/20 0031

## 2020-01-31 NOTE — ED Notes (Signed)
   Patient was noted in SVT rhythm at rate of 192 bpm.  Dr. Vanita Panda was consulted and he and Shawn PA came to the bedside.  Patient was stable and A&O x4.  Patient was assessed and given 6mg  adenosine which converted her back to ST with rate of 108 bpm.    Patient is A&O x4.  Tolerated medication well.

## 2020-01-31 NOTE — ED Triage Notes (Addendum)
  Patient BIB EMS for SOB that has been going on for a few days.  Patient has hx of asthma and was recently hospitalized.  Patient was 74% SPO2 on EMS arrival, patient was given 125mg  solumedrol and started 2mg  magnesium infusion.  Patient is A&O x4.  Was febrile on arrival at 101.2   No sick contacts but was recently discharged from hospital.  Patient rectal temp 103.7  Patient was on dialysis during kidney transplant but does not do it anymore.  Fistula still in L arm.

## 2020-02-01 ENCOUNTER — Encounter (HOSPITAL_COMMUNITY): Payer: Self-pay | Admitting: Internal Medicine

## 2020-02-01 ENCOUNTER — Inpatient Hospital Stay (HOSPITAL_COMMUNITY): Payer: Medicare Other

## 2020-02-01 DIAGNOSIS — Z940 Kidney transplant status: Secondary | ICD-10-CM

## 2020-02-01 DIAGNOSIS — J4520 Mild intermittent asthma, uncomplicated: Secondary | ICD-10-CM

## 2020-02-01 DIAGNOSIS — I5032 Chronic diastolic (congestive) heart failure: Secondary | ICD-10-CM

## 2020-02-01 DIAGNOSIS — E785 Hyperlipidemia, unspecified: Secondary | ICD-10-CM

## 2020-02-01 DIAGNOSIS — J9601 Acute respiratory failure with hypoxia: Secondary | ICD-10-CM

## 2020-02-01 DIAGNOSIS — K219 Gastro-esophageal reflux disease without esophagitis: Secondary | ICD-10-CM

## 2020-02-01 DIAGNOSIS — A419 Sepsis, unspecified organism: Principal | ICD-10-CM

## 2020-02-01 DIAGNOSIS — J411 Mucopurulent chronic bronchitis: Secondary | ICD-10-CM

## 2020-02-01 DIAGNOSIS — R6520 Severe sepsis without septic shock: Secondary | ICD-10-CM

## 2020-02-01 DIAGNOSIS — M7989 Other specified soft tissue disorders: Secondary | ICD-10-CM

## 2020-02-01 DIAGNOSIS — D649 Anemia, unspecified: Secondary | ICD-10-CM

## 2020-02-01 LAB — COMPREHENSIVE METABOLIC PANEL
ALT: 8 U/L (ref 0–44)
AST: 13 U/L — ABNORMAL LOW (ref 15–41)
Albumin: 2.3 g/dL — ABNORMAL LOW (ref 3.5–5.0)
Alkaline Phosphatase: 130 U/L — ABNORMAL HIGH (ref 38–126)
Anion gap: 8 (ref 5–15)
BUN: 18 mg/dL (ref 8–23)
CO2: 21 mmol/L — ABNORMAL LOW (ref 22–32)
Calcium: 8.8 mg/dL — ABNORMAL LOW (ref 8.9–10.3)
Chloride: 107 mmol/L (ref 98–111)
Creatinine, Ser: 0.78 mg/dL (ref 0.44–1.00)
GFR calc Af Amer: >60 mL/min (ref >60)
GFR calc non Af Amer: >60 mL/min (ref >60)
Glucose, Bld: 173 mg/dL — ABNORMAL HIGH (ref 70–99)
Potassium: 5.1 mmol/L (ref 3.5–5.1)
Sodium: 136 mmol/L (ref 135–145)
Total Bilirubin: 1.1 mg/dL (ref 0.3–1.2)
Total Protein: 6.5 g/dL (ref 6.5–8.1)

## 2020-02-01 LAB — CREATININE, SERUM
Creatinine, Ser: 0.89 mg/dL (ref 0.44–1.00)
GFR calc Af Amer: >60 mL/min (ref >60)
GFR calc non Af Amer: >60 mL/min (ref >60)

## 2020-02-01 LAB — PROTIME-INR
INR: 1.4 — ABNORMAL HIGH (ref 0.8–1.2)
Prothrombin Time: 16.6 seconds — ABNORMAL HIGH (ref 11.4–15.2)

## 2020-02-01 LAB — MAGNESIUM: Magnesium: 2.1 mg/dL (ref 1.7–2.4)

## 2020-02-01 LAB — CBC
HCT: 27.7 % — ABNORMAL LOW (ref 36.0–46.0)
Hemoglobin: 7.6 g/dL — ABNORMAL LOW (ref 12.0–15.0)
MCH: 27 pg (ref 26.0–34.0)
MCHC: 27.4 g/dL — ABNORMAL LOW (ref 30.0–36.0)
MCV: 98.6 fL (ref 80.0–100.0)
Platelets: 247 10*3/uL (ref 150–400)
RBC: 2.81 MIL/uL — ABNORMAL LOW (ref 3.87–5.11)
RDW: 19.9 % — ABNORMAL HIGH (ref 11.5–15.5)
WBC: 9.8 10*3/uL (ref 4.0–10.5)
nRBC: 0.2 % (ref 0.0–0.2)

## 2020-02-01 LAB — URINE CULTURE

## 2020-02-01 LAB — PHOSPHORUS: Phosphorus: 4.5 mg/dL (ref 2.5–4.6)

## 2020-02-01 LAB — SARS CORONAVIRUS 2 (TAT 6-24 HRS): SARS Coronavirus 2: NEGATIVE

## 2020-02-01 LAB — CORTISOL-AM, BLOOD: Cortisol - AM: 8.8 ug/dL (ref 6.7–22.6)

## 2020-02-01 LAB — BRAIN NATRIURETIC PEPTIDE: B Natriuretic Peptide: 574.8 pg/mL — ABNORMAL HIGH (ref 0.0–100.0)

## 2020-02-01 LAB — PROCALCITONIN: Procalcitonin: 0.71 ng/mL

## 2020-02-01 LAB — LACTIC ACID, PLASMA: Lactic Acid, Venous: 0.8 mmol/L (ref 0.5–1.9)

## 2020-02-01 MED ORDER — SACCHAROMYCES BOULARDII 250 MG PO CAPS
250.0000 mg | ORAL_CAPSULE | Freq: Two times a day (BID) | ORAL | Status: DC
  Administered 2020-02-01 – 2020-02-06 (×11): 250 mg via ORAL
  Filled 2020-02-01 (×13): qty 1

## 2020-02-01 MED ORDER — DOCUSATE SODIUM 100 MG PO CAPS
100.0000 mg | ORAL_CAPSULE | Freq: Two times a day (BID) | ORAL | Status: DC
  Administered 2020-02-01 – 2020-02-05 (×5): 100 mg via ORAL
  Filled 2020-02-01 (×7): qty 1

## 2020-02-01 MED ORDER — ALBUTEROL SULFATE HFA 108 (90 BASE) MCG/ACT IN AERS
2.0000 | INHALATION_SPRAY | RESPIRATORY_TRACT | Status: DC | PRN
  Filled 2020-02-01: qty 6.7

## 2020-02-01 MED ORDER — FLUOXETINE HCL 20 MG PO CAPS
40.0000 mg | ORAL_CAPSULE | Freq: Every day | ORAL | Status: DC
  Administered 2020-02-01 – 2020-02-06 (×6): 40 mg via ORAL
  Filled 2020-02-01 (×7): qty 2

## 2020-02-01 MED ORDER — LATANOPROST 0.005 % OP SOLN
1.0000 [drp] | Freq: Every day | OPHTHALMIC | Status: DC
  Administered 2020-02-01 – 2020-02-05 (×5): 1 [drp] via OPHTHALMIC
  Filled 2020-02-01: qty 2.5

## 2020-02-01 MED ORDER — PANTOPRAZOLE SODIUM 40 MG PO TBEC
40.0000 mg | DELAYED_RELEASE_TABLET | Freq: Two times a day (BID) | ORAL | Status: DC
  Administered 2020-02-01 – 2020-02-06 (×11): 40 mg via ORAL
  Filled 2020-02-01 (×11): qty 1

## 2020-02-01 MED ORDER — ASPIRIN EC 325 MG PO TBEC
325.0000 mg | DELAYED_RELEASE_TABLET | Freq: Every day | ORAL | Status: DC
  Administered 2020-02-01 – 2020-02-06 (×6): 325 mg via ORAL
  Filled 2020-02-01 (×6): qty 1

## 2020-02-01 MED ORDER — DORZOLAMIDE HCL 2 % OP SOLN
1.0000 [drp] | Freq: Every day | OPHTHALMIC | Status: DC
  Administered 2020-02-01 – 2020-02-06 (×6): 1 [drp] via OPHTHALMIC
  Filled 2020-02-01: qty 10

## 2020-02-01 MED ORDER — ALBUTEROL SULFATE (2.5 MG/3ML) 0.083% IN NEBU: 2.5000 mg | INHALATION_SOLUTION | RESPIRATORY_TRACT | Status: DC | PRN

## 2020-02-01 MED ORDER — BRIMONIDINE TARTRATE 0.2 % OP SOLN
1.0000 [drp] | Freq: Two times a day (BID) | OPHTHALMIC | Status: DC
  Administered 2020-02-01 – 2020-02-06 (×10): 1 [drp] via OPHTHALMIC
  Filled 2020-02-01: qty 5

## 2020-02-01 MED ORDER — MAGNESIUM OXIDE 400 (241.3 MG) MG PO TABS
400.0000 mg | ORAL_TABLET | Freq: Two times a day (BID) | ORAL | Status: DC
  Administered 2020-02-01 – 2020-02-06 (×11): 400 mg via ORAL
  Filled 2020-02-01 (×11): qty 1

## 2020-02-01 MED ORDER — TACROLIMUS 0.5 MG PO CAPS
0.5000 mg | ORAL_CAPSULE | Freq: Every day | ORAL | Status: DC
  Administered 2020-02-01 – 2020-02-05 (×5): 0.5 mg via ORAL
  Filled 2020-02-01 (×6): qty 1

## 2020-02-01 MED ORDER — SODIUM CHLORIDE 0.9 % IV SOLN
INTRAVENOUS | Status: DC | PRN
  Administered 2020-02-01 – 2020-02-02 (×2): 500 mL via INTRAVENOUS

## 2020-02-01 MED ORDER — ONDANSETRON HCL 4 MG/2ML IJ SOLN: 4.0000 mg | Freq: Four times a day (QID) | INTRAMUSCULAR | Status: DC | PRN

## 2020-02-01 MED ORDER — PREDNISONE 5 MG PO TABS
7.5000 mg | ORAL_TABLET | Freq: Every day | ORAL | Status: DC
  Administered 2020-02-02 – 2020-02-06 (×5): 7.5 mg via ORAL
  Filled 2020-02-01 (×5): qty 2

## 2020-02-01 MED ORDER — MYCOPHENOLATE SODIUM 180 MG PO TBEC
180.0000 mg | DELAYED_RELEASE_TABLET | Freq: Two times a day (BID) | ORAL | Status: DC
  Administered 2020-02-01 – 2020-02-06 (×11): 180 mg via ORAL
  Filled 2020-02-01 (×13): qty 1

## 2020-02-01 MED ORDER — HYDROCODONE-ACETAMINOPHEN 5-325 MG PO TABS: 1.0000 | ORAL_TABLET | ORAL | Status: DC | PRN

## 2020-02-01 MED ORDER — ONDANSETRON HCL 4 MG PO TABS: 4.0000 mg | ORAL_TABLET | Freq: Four times a day (QID) | ORAL | Status: DC | PRN

## 2020-02-01 MED ORDER — SODIUM CHLORIDE 0.9% FLUSH
3.0000 mL | Freq: Two times a day (BID) | INTRAVENOUS | Status: DC
  Administered 2020-02-01 – 2020-02-06 (×11): 3 mL via INTRAVENOUS

## 2020-02-01 MED ORDER — MOMETASONE FURO-FORMOTEROL FUM 200-5 MCG/ACT IN AERO
2.0000 | INHALATION_SPRAY | Freq: Two times a day (BID) | RESPIRATORY_TRACT | Status: DC
  Administered 2020-02-01 – 2020-02-06 (×12): 2 via RESPIRATORY_TRACT
  Filled 2020-02-01 (×2): qty 8.8

## 2020-02-01 MED ORDER — ENOXAPARIN SODIUM 40 MG/0.4ML ~~LOC~~ SOLN
40.0000 mg | Freq: Every day | SUBCUTANEOUS | Status: DC
  Administered 2020-02-01 – 2020-02-06 (×6): 40 mg via SUBCUTANEOUS
  Filled 2020-02-01 (×6): qty 0.4

## 2020-02-01 MED ORDER — ALLOPURINOL 300 MG PO TABS
300.0000 mg | ORAL_TABLET | Freq: Every day | ORAL | Status: DC
  Administered 2020-02-01 – 2020-02-06 (×6): 300 mg via ORAL
  Filled 2020-02-01 (×7): qty 1

## 2020-02-01 MED ORDER — POLYETHYLENE GLYCOL 3350 17 G PO PACK: 17.0000 g | PACK | Freq: Every day | ORAL | Status: DC | PRN

## 2020-02-01 MED ORDER — TACROLIMUS 1 MG PO CAPS
1.0000 mg | ORAL_CAPSULE | Freq: Every day | ORAL | Status: DC
  Administered 2020-02-01 – 2020-02-06 (×6): 1 mg via ORAL
  Filled 2020-02-01 (×7): qty 1

## 2020-02-01 MED ORDER — METHOCARBAMOL 500 MG PO TABS
500.0000 mg | ORAL_TABLET | Freq: Four times a day (QID) | ORAL | Status: DC | PRN
  Administered 2020-02-03 – 2020-02-05 (×4): 500 mg via ORAL
  Filled 2020-02-01 (×4): qty 1

## 2020-02-01 MED ORDER — SODIUM CHLORIDE 0.9 % IV SOLN: INTRAVENOUS | Status: AC

## 2020-02-01 MED ORDER — GABAPENTIN 100 MG PO CAPS
200.0000 mg | ORAL_CAPSULE | Freq: Three times a day (TID) | ORAL | Status: DC
  Administered 2020-02-01 – 2020-02-06 (×16): 200 mg via ORAL
  Filled 2020-02-01 (×16): qty 2

## 2020-02-01 MED ORDER — ACETAMINOPHEN 325 MG PO TABS
650.0000 mg | ORAL_TABLET | Freq: Four times a day (QID) | ORAL | Status: DC | PRN
  Administered 2020-02-02 – 2020-02-05 (×8): 650 mg via ORAL
  Filled 2020-02-01 (×8): qty 2

## 2020-02-01 MED ORDER — ACETAMINOPHEN 650 MG RE SUPP: 650.0000 mg | Freq: Four times a day (QID) | RECTAL | Status: DC | PRN

## 2020-02-01 NOTE — Consult Note (Addendum)
[facility] Renal Consultation Note    Indication for Consultation:  Renal transplant patient febrile and SOB PCP: Theadore Nan, MD  HPI: April [name] is a 64 y.o. female presenting to the ED febrile with SOB. Patient noted to be tachypnic and tachycardic in the ED meeting SIRS criteria. Patient underwent cardioversion with 6mg  adenosine for SVT. Patient with periods of hypotension, but responsive to IVF's.  Patient started on empiric abx vancomycin, cefepime and metronidazole, awaiting culture results.  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 21), recurrent mucopurulent bronchitis. Last discharge from rehab with creatinine baseline 1.02-1.29 (01/05/20). Consulted due to hx of renal transplant.  Fortunately her allograft function is stable- she is feeling clinically better. Source of fever as of yet is unknown   Past Medical History:  Diagnosis Date  . Arthritis   . Asthma   . Depression   . GERD (gastroesophageal reflux disease)   . Hyperlipidemia   . Hyperparathyroidism   . Polycystic kidney   . PONV (postoperative nausea and vomiting)    Past Surgical History:  Procedure Laterality Date  . 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 BILATERAL OOPHORECTOMY    . CARPAL TUNNEL RELEASE  2000  . CESAREAN SECTION    . INSERTION OF DIALYSIS CATHETER  03/31/2012   Procedure: INSERTION OF DIALYSIS CATHETER;  Surgeon: Angelia Mould, MD;  Location: [place];  Service: Vascular;  Laterality: N/A;  insertion of dialysis catheter right internal jugular vein  . 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 ORIF TIBIA FRACTURE Left 12/23/2018   Procedure: OPEN REDUCTION INTERNAL FIXATION (ORIF) TIBIA FRACTURE;  Surgeon: Shona Needles, MD;  Location: [place];  Service: Orthopedics;  Laterality: Left;  . ORIF TIBIA PLATEAU Right 12/23/2018   Procedure: OPEN REDUCTION INTERNAL FIXATION  (ORIF) TIBIAL PLATEAU;  Surgeon: Shona Needles, MD;  Location: [place];  Service: Orthopedics;  Laterality: Right;  . ORIF WRIST FRACTURE Left 12/08/2019   Procedure: OPEN REDUCTION INTERNAL FIXATION (ORIF) WRIST FRACTURE, REPAIR LACERATION LEFT FOREARM;  Surgeon: Dorna Leitz, MD;  Location: [facility];  Service: Orthopedics;  Laterality: Left;  . TONSILLECTOMY  1967  . TOTAL HIP ARTHROPLASTY Left 12/08/2019   Procedure: TOTAL HIP ARTHROPLASTY ANTERIOR APPROACH;  Surgeon: Dorna Leitz, MD;  Location: [facility];  Service: Orthopedics;  Laterality: Left;  . TUBAL LIGATION  2010   Family History  Problem Relation Age of Onset  . Hypertension Mother   . Heart disease Father        CABG history  . Polycystic kidney disease Father   . Polycystic kidney disease Brother    Social History:  reports that she quit smoking about 21 years ago. Her smoking use included cigarettes. She has a 3.75 pack-year smoking history. She has never used smokeless tobacco. She reports that she does not drink alcohol or use drugs.  ROS: As per HPI otherwise negative.  Review of Systems: Gen: endorses fever, chills, fatigue, AMS, weakness Denies weight loss, and/or sleep disorders. HEENT: Denies blurred vision, sore throat or epistaxis. No sinusitis.   CV: endorses peripheral edema L>R Denies chest pain, angina, palpitations, orthopnea, PND, and claudication. Resp: Denies dyspnea at rest, dyspnea with exercise, cough, sputum, or wheezing. GI: Denies nausea, vomiting, abdominal pain, constipation or diarrhea. GU: endorses dysuria denies hematuria. MS: endorses back and leg pain associated with recent surgery Denies joint pain, muscle pain  or cramps Psych: endorses anxiety Denies depression  Heme: endorses easy bruising  Denies: bleeding, and enlarged lymph nodes. Neuro: No headache, dizziness, or weakness. Endocrine: No hypoglycemia or thyroid disease.  Physical Exam: Vitals:   02/01/20 0645 02/01/20 0700  02/01/20 0715 02/01/20 0800  BP: 116/60 (!) 105/59 119/62 123/70  Pulse: 75 76 73 74  Resp:   16   Temp:      TempSrc:      SpO2: 100% 100% 100% 100%  Weight:      Height:         General: Well developed, well nourished, in no acute distress. Head: Normocephalic, atraumatic, sclera non-icteric, mucus membranes are moist. Neck: Supple without lymphadenopathy/masses. JVD not elevated. Lungs: Crackles at bases bilaterally, without wheezes, rales, or rhonchi. Breathing is mildly labored. Heart: RRR with normal S1, S2. No murmurs, rubs, or gallops appreciated. Abdomen: Soft, non-tender, non-distended with normoactive bowel sounds. No rebound/guarding. No obvious abdominal masses. Musculoskeletal:  Strength and tone appear normal for age. Lower extremities: 1+ pitting edema  Neuro: Alert and oriented X 3. Moves all extremities spontaneously. Psych:  Responds to questions appropriately with a normal affect.  Allergies  Allergen Reactions  . Infed [Iron Dextran] Other (See Comments)    Chest tightness  . Pentamidine Itching, Shortness Of Breath and Swelling  . Erythromycin [Erythromycin] Other (See Comments)    Mouth Ulcers  . Iohexol Other (See Comments)    Per patient "she has had a kidney transplant and should never have contrast"  . Oxycodone Nausea Only and Nausea And Vomiting  . Erythromycin Rash    Causes breakout in mouth  . Ultram [Tramadol Hcl] Anxiety   Prior to Admission medications   Medication Sig Start Date End Date Taking? Authorizing Provider  albuterol (VENTOLIN HFA) 108 (90 Base) MCG/ACT inhaler Inhale 2 puffs into the lungs every 4 (four) hours as needed for wheezing or shortness of breath. 01/04/20  Yes Angiulli, Lavon Paganini, PA-C  allopurinol (ZYLOPRIM) 300 MG tablet Take 1 tablet (300 mg total) by mouth daily. 01/04/20  Yes Angiulli, Lavon Paganini, PA-C  aspirin 81 MG chewable tablet Chew 81 mg by mouth daily.   Yes [provider]  brimonidine (ALPHAGAN P) 0.1 %  SOLN Place 1 drop 2 (two) times daily into the left eye.   Yes [provider]  calcium carbonate (TUMS - DOSED IN MG ELEMENTAL CALCIUM) 500 MG chewable tablet Chew 2 tablets (400 mg of elemental calcium total) by mouth 4 (four) times daily as needed for indigestion or heartburn. 01/04/20  Yes Angiulli, Lavon Paganini, PA-C  cholecalciferol (VITAMIN D3) 25 MCG (1000 UNIT) tablet Take 1 tablet (1,000 Units total) by mouth daily. 01/04/20  Yes Angiulli, Lavon Paganini, PA-C  cycloSPORINE (RESTASIS) 0.05 % ophthalmic emulsion Place 1 drop into both eyes 2 (two) times daily. 01/04/20  Yes Angiulli, Lavon Paganini, PA-C  docusate sodium (COLACE) 100 MG capsule Take 1 capsule (100 mg total) by mouth 2 (two) times daily. Patient taking differently: Take 100 mg by mouth 2 (two) times daily as needed for mild constipation.  12/13/19  Yes Shelly Coss, MD  dorzolamide (TRUSOPT) 2 % ophthalmic solution Place 1 drop into both eyes daily. 06/25/19  Yes [provider]  FLUoxetine (PROZAC) 40 MG capsule Take 1 capsule (40 mg total) by mouth daily. 01/04/20  Yes Angiulli, Lavon Paganini, PA-C  gabapentin (NEURONTIN) 100 MG capsule Take 200 mg by mouth 3 (three) times daily.   Yes [provider]  HYDROcodone-acetaminophen (NORCO/VICODIN) 5-325 MG tablet Take 1 tablet by mouth every 4 (four) hours as needed for severe pain. 01/04/20  Yes Angiulli, Lavon Paganini, PA-C  magnesium oxide (MAG-OX) 400 MG tablet Take 1 tablet (400 mg total) by mouth 2 (two) times daily. 01/04/20  Yes Angiulli, Lavon Paganini, PA-C  methocarbamol (ROBAXIN) 500 MG tablet Take 1 tablet (500 mg total) by mouth 4 (four) times daily. 01/04/20  Yes Angiulli, Lavon Paganini, PA-C  mycophenolate (MYFORTIC) 180 MG EC tablet Take 180 mg by mouth 2 (two) times daily.   Yes [provider]  pantoprazole (PROTONIX) 40 MG tablet Take 1 tablet (40 mg total) by mouth 2 (two) times daily. 01/04/20  Yes Angiulli, Lavon Paganini, PA-C  polyethylene glycol (MIRALAX / GLYCOLAX) 17  g packet Take 17 g by mouth daily as needed for mild constipation. 12/13/19  Yes Shelly Coss, MD  predniSONE (DELTASONE) 5 MG tablet Take 2.5-5 mg by mouth See admin instructions. 5 mg in the morning, 2.5 mg in the evening   Yes [provider]  PROGRAF 1 MG capsule Take 1 mg by mouth daily. 03/22/19  Yes [provider]  SYMBICORT 160-4.5 MCG/ACT inhaler Inhale 2 puffs into the lungs 2 (two) times daily. 01/04/20  Yes Angiulli, Lavon Paganini, PA-C  tacrolimus (PROGRAF) 0.5 MG capsule Take 0.5 mg by mouth at bedtime. Takes brand name Prograf   Yes [provider]  TRAVATAN Z 0.004 % SOLN ophthalmic solution Place 1 drop into both eyes at bedtime. 07/03/16  Yes [provider]  vitamin B-12 (CYANOCOBALAMIN) 100 MCG tablet Take 100 mcg by mouth daily.   Yes [provider]   Current Facility-Administered Medications  Medication Dose Route Frequency Provider Last Rate Last Admin  . 0.9 %  sodium chloride infusion   Intravenous Continuous Gilles Chiquito B, MD 100 mL/hr at 02/01/20 0300 New Bag at 02/01/20 0300  . acetaminophen (TYLENOL) tablet 650 mg  650 mg Oral Q6H PRN Sid Falcon, MD       Or  . acetaminophen (TYLENOL) suppository 650 mg  650 mg Rectal Q6H PRN Sid Falcon, MD      . albuterol (VENTOLIN HFA) 108 (90 Base) MCG/ACT inhaler 2 puff  2 puff Inhalation Q4H PRN Gilles Chiquito B, MD      . allopurinol (ZYLOPRIM) tablet 300 mg  300 mg Oral Daily Gilles Chiquito B, MD   300 mg at 02/01/20 0947  . aspirin EC tablet 325 mg  325 mg Oral Q breakfast Gilles Chiquito B, MD   325 mg at 02/01/20 0946  . brimonidine (ALPHAGAN) 0.2 % ophthalmic solution 1 drop  1 drop Left Eye BID Gilles Chiquito B, MD      . ceFEPIme (MAXIPIME) 2 g in sodium chloride 0.9 % 100 mL IVPB  2 g Intravenous Q8H Sid Falcon, MD   Stopped at 02/01/20 0825  . docusate sodium (COLACE) capsule 100 mg  100 mg Oral BID Gilles Chiquito B, MD   100 mg at 02/01/20 0947  . dorzolamide  (TRUSOPT) 2 % ophthalmic solution 1 drop  1 drop Both Eyes Daily Sid Falcon, MD   1 drop at 02/01/20 0950  . enoxaparin (LOVENOX) injection 40 mg  40 mg Subcutaneous Daily Gilles Chiquito B, MD   40 mg at 02/01/20 0950  . FLUoxetine (PROZAC) capsule 40 mg  40 mg Oral Daily Gilles Chiquito B, MD   40 mg at 02/01/20 0947  . gabapentin (NEURONTIN) capsule 200  mg  200 mg Oral TID Sid Falcon, MD      . HYDROcodone-acetaminophen (NORCO/VICODIN) 5-325 MG per tablet 1 tablet  1 tablet Oral Q4H PRN Sid Falcon, MD      . latanoprost (XALATAN) 0.005 % ophthalmic solution 1 drop  1 drop Both Eyes QHS Gilles Chiquito B, MD      . magnesium oxide (MAG-OX) tablet 400 mg  400 mg Oral BID Gilles Chiquito B, MD   400 mg at 02/01/20 0947  . methocarbamol (ROBAXIN) tablet 500 mg  500 mg Oral Q6H PRN Sid Falcon, MD      . mometasone-formoterol [facility]) 200-5 MCG/ACT inhaler 2 puff  2 puff Inhalation BID Sid Falcon, MD   2 puff at 02/01/20 0950  . mycophenolate (MYFORTIC) EC tablet 180 mg  180 mg Oral BID Gilles Chiquito B, MD   180 mg at 02/01/20 0949  . ondansetron (ZOFRAN) tablet 4 mg  4 mg Oral Q6H PRN Sid Falcon, MD       Or  . ondansetron [facility]) injection 4 mg  4 mg Intravenous Q6H PRN Sid Falcon, MD      . pantoprazole (PROTONIX) EC tablet 40 mg  40 mg Oral BID Gilles Chiquito B, MD   40 mg at 02/01/20 0947  . polyethylene glycol (MIRALAX / GLYCOLAX) packet 17 g  17 g Oral Daily PRN Sid Falcon, MD      . saccharomyces boulardii (FLORASTOR) capsule 250 mg  250 mg Oral BID Gilles Chiquito B, MD   250 mg at 02/01/20 0949  . sodium chloride flush (NS) 0.9 % injection 3 mL  3 mL Intravenous Q12H Gilles Chiquito B, MD      . tacrolimus (PROGRAF) capsule 0.5 mg  0.5 mg Oral QHS Gilles Chiquito B, MD      . tacrolimus (PROGRAF) capsule 1 mg  1 mg Oral Daily Gilles Chiquito B, MD   1 mg at 02/01/20 0947  . vancomycin (VANCOCIN) IVPB 1000 mg/200 mL premix  1,000 mg Intravenous Q24H Duanne Limerick, [facility]        Current Outpatient Medications  Medication Sig Dispense Refill  . albuterol (VENTOLIN HFA) 108 (90 Base) MCG/ACT inhaler Inhale 2 puffs into the lungs every 4 (four) hours as needed for wheezing or shortness of breath. 18 g 0  . allopurinol (ZYLOPRIM) 300 MG tablet Take 1 tablet (300 mg total) by mouth daily. 30 tablet 0  . aspirin 81 MG chewable tablet Chew 81 mg by mouth daily.    . brimonidine (ALPHAGAN P) 0.1 % SOLN Place 1 drop 2 (two) times daily into the left eye.    . calcium carbonate (TUMS - DOSED IN MG ELEMENTAL CALCIUM) 500 MG chewable tablet Chew 2 tablets (400 mg of elemental calcium total) by mouth 4 (four) times daily as needed for indigestion or heartburn.    . cholecalciferol (VITAMIN D3) 25 MCG (1000 UNIT) tablet Take 1 tablet (1,000 Units total) by mouth daily. 30 tablet 0  . cycloSPORINE (RESTASIS) 0.05 % ophthalmic emulsion Place 1 drop into both eyes 2 (two) times daily. 0.4 mL 0  . docusate sodium (COLACE) 100 MG capsule Take 1 capsule (100 mg total) by mouth 2 (two) times daily. (Patient taking differently: Take 100 mg by mouth 2 (two) times daily as needed for mild constipation. ) 10 capsule 0  . dorzolamide (TRUSOPT) 2 % ophthalmic solution Place 1 drop into both eyes daily.    [name] FLUoxetine (  PROZAC) 40 MG capsule Take 1 capsule (40 mg total) by mouth daily. 30 capsule 3  . gabapentin (NEURONTIN) 100 MG capsule Take 200 mg by mouth 3 (three) times daily.    [name] HYDROcodone-acetaminophen (NORCO/VICODIN) 5-325 MG tablet Take 1 tablet by mouth every 4 (four) hours as needed for severe pain. 30 tablet 0  . magnesium oxide (MAG-OX) 400 MG tablet Take 1 tablet (400 mg total) by mouth 2 (two) times daily. 60 tablet 0  . methocarbamol (ROBAXIN) 500 MG tablet Take 1 tablet (500 mg total) by mouth 4 (four) times daily. 180 tablet 0  . mycophenolate (MYFORTIC) 180 MG EC tablet Take 180 mg by mouth 2 (two) times daily.    . pantoprazole (PROTONIX) 40 MG tablet Take 1 tablet (40  mg total) by mouth 2 (two) times daily. 60 tablet 0  . polyethylene glycol (MIRALAX / GLYCOLAX) 17 g packet Take 17 g by mouth daily as needed for mild constipation. 14 each 0  . predniSONE (DELTASONE) 5 MG tablet Take 2.5-5 mg by mouth See admin instructions. 5 mg in the morning, 2.5 mg in the evening    . PROGRAF 1 MG capsule Take 1 mg by mouth daily.    . SYMBICORT 160-4.5 MCG/ACT inhaler Inhale 2 puffs into the lungs 2 (two) times daily. 1 Inhaler 12  . tacrolimus (PROGRAF) 0.5 MG capsule Take 0.5 mg by mouth at bedtime. Takes brand name Prograf    . TRAVATAN Z 0.004 % SOLN ophthalmic solution Place 1 drop into both eyes at bedtime.    . vitamin B-12 (CYANOCOBALAMIN) 100 MCG tablet Take 100 mcg by mouth daily.     Labs: Basic Metabolic Panel: Recent Labs  Lab 01/31/20 2007 01/31/20 2229 02/01/20 0628  NA 133* 132*  --   K 4.4 4.7  --   CL 102  --   --   CO2 22  --   --   GLUCOSE 136*  --   --   BUN 13  --   --   CREATININE 0.92  --  0.89  CALCIUM 9.2  --   --    Liver Function Tests: Recent Labs  Lab 01/31/20 2007  AST 14*  ALT 8  ALKPHOS 143*  BILITOT 1.2  PROT 6.9  ALBUMIN 2.5*   CBC: Recent Labs  Lab 01/31/20 2007 01/31/20 2229 02/01/20 0628  WBC 9.2  --  9.8  NEUTROABS 8.1*  --   --   HGB 8.7* 7.5* 7.6*  HCT 30.3* 22.0* 27.7*  MCV 98.1  --  98.6  PLT 293  --  247   Studies/Results: DG Chest Port 1 View  Result Date: 01/31/2020 CLINICAL DATA:  Dyspnea. EXAM: PORTABLE CHEST 1 VIEW COMPARISON:  January 07, 2020. FINDINGS: Inspiration remains shallow. There is stable cardiomegaly with central pulmonary vascular congestion, trace left pleural effusion and mild pulmonary edema. Telemetry leads preclude evaluation of the right lateral costophrenic angle for any residual right pleural effusion. Trace fluid continues to layer along the minor fissure. Bilateral infrahilar streaky consolidation probably represents bronchitis. There is no convincing evidence for  pneumonia. There is no pneumothorax. Diffuse bone demineralization and mild skeletal degenerative changes are present. There are telemetry leads. IMPRESSION: Shallow inspiration with stable cardiomegaly, mild pulmonary edema and trace bilateral pleural effusion. Streaky infrahilar consolidation, probably a nonspecific bronchitis. No pneumonia. Electronically Signed   By: Revonda Humphrey   On: 01/31/2020 21:17   Dialysis Orders: n/a  Assessment/Plan:  #Sepsis in a  transplant recipient, source unknown Acute respiratory failure with hypoxia  Leukocytosis with a neutrophil predominance noted, possible sources include lung, skin, urinary, implanted material (fistula). Though this could certainly be COVID related, she does not have a clear CXR which is consistent with viral pneumonia, she does not have lymphopenia and no contacts.  She is at high risk for a bacterial infection given her immunosuppresion, and based on chart review, she has had recurrent mucopurulent bronchitis in the past - trent PCT, LA - empiric Cefepime, metronidazole, vancomycin started -BC and UC pending - IVF with NS at 100cc/hr - Oxygen to keep O2 sats > 90% - Continue asthma treatment, hold off on steroids given acute infection - Tylenol for fever - In an immunocompromised patient, it would be prudent to get ID involved early, consider consult in the AM - Initial COVID testing negative in ED, follow up PCR testing sent  # PCKD s/p transplant (2013) creatinine baseline 1.02-1.29 at discharge from rehab 01/05/20>>In ED creatinine 0.92>>0.89 -continue IVF - actually will stop them-  She is not dry -monitor daily BMP -continue home immunosuppression management-prograf 1mg  qam & 0.5mg  qhs, myfortic 180mg  BID, prednisone 7.5mg  daily currently held for infective r/o   #Hypertension/volume No home HTN management from chart review. Chronic LE edema L>R CXR with evidence of bilateral mild pulmonary edema and trace pleural  effusions -consider low dose lasix for mild pulmonary edema and SOB  #Anemia Baseline anemia-hgb range 8-10 on arrival 8.7(3/10)>>7.6 (3/11) -monitor daily CBC -maintain EAV>4.0  #Metabolic bone disease Corrected calcium 10.4/phos-pending  Patient seen and examined, agree with above note with above modifications. Briefly a 64 year old WF s/p renal transplant for PKD in 2013.  Has done well in terms of allograft function -  crt is normal - followed by Dr. Jimmy Footman.  She is recently discharged from rehab after hip fracture.  She felt poorly - came to ER found to be febrile and with SVT- req adenosine-  Then given ABX with good clinical results.  We are consulted as she is transplant patient.  Since her infectious illness does not seem to be severe, I really do not see a need to modify her current IS regimen.  So I will resume her prednisone 7.5 mg daily. I will stop her IVF as I dont feel she is dry, is actually edematous.  We would be happy to follow along with you.  Hopefully willl not need prolonged hospitalization  Corliss Parish, MD 02/01/2020

## 2020-02-01 NOTE — Progress Notes (Signed)
PROGRESS NOTE    April [name]  IOE:[id number] DOB: May 10, 1956 DOA: 01/31/2020 PCP: [name], MD   Brief Narrative:  HPI per Dr. Gilles Chiquito on 01/31/20 April [name] is a 64 y.o. 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  She reports that her symptoms started this morning.  She became more and more short of breath.  She has this issue sometimes and takes her home asthma medications, however, she was not able to get the symptoms under control.  Usually when she cannot get it under control, she will come to the hospital to take steroids.  She was noted by EMS to have saturation to 74% on RA.  She improved with medications to 90% on RA and then to 96% with 2LNC.  She further had an elevated HR with palpitations, fever to 103F which improved with tylenol and tachypnea.  She notes 1 day of productive cough, chills, sneezing, sinus congestion, nausea.  She has had no sick contacts and no contacts with coronavirus.  She is remaining at home and waiting to get her vaccine.  She denied any urinary symptoms, change in urination, chest pain, pain with deep breathing.  She does have chronic skin changes, a small area of soreness on her left buttock and some chronic LE swelling, worse on the left.  She notes that she had a hip replacement in January and also had pelvic fractures which have limited her mobility recently.  She has no headache or vision changes.    ED Course: In the ED, she was found to be Febrile with an elevated HR and RR with hypoxia.  She was noted to have a normal WBC.  CXR showed only pulmonary edema, but no obvious pneumonia.  She may have had a bronchitis.  Blood cultures and urine culture were obtained.  She was started on antibiotics for an unknown source in an immunocompromised person.  Initial COVID Ag testing is negative  **Interim History She states that  she is feeling much better nephrology was consulted for further evaluation recommendations.  Nephrology recommends continuing her current suppressive medications and they have stopped IV fluids.  We will continue broad-spectrum antibiotics and await for cultures to result prior to calling infectious diseases.  For now she will remain on broad-spectrum antibiotics.  Assessment & Plan:   Active Problems:   Bronchitis, mucopurulent recurrent (HCC)   Asthma, mild intermittent   GERD (gastroesophageal reflux disease)   Hyperlipidemia   Chronic diastolic CHF (congestive heart failure) (HCC)   Acute respiratory failure with hypoxia (HCC)   Sepsis ([place])  Sepsis in a transplant recipient, source unknown Acute respiratory failure with hypoxia  -She currently does not have a leukocytosis anymore and her WBC is 9.8 - Sources include lung, skin, urinary, implanted material (fistula) unclear what her etiology is -Though this could certainly be COVID related this is highly unlikely, she does not have a CXR which is consistent with viral pneumonia, she does not have lymphopenia and no contacts.  She is at high risk for a bacterial infection given her immunosuppresion, and based on chart review, she has had recurrent mucopurulent bronchitis in the past -CXR did show "Shallow inspiration with stable cardiomegaly, mild pulmonary edema and trace bilateral pleural effusion. Streaky infrahilar consolidation, probably a nonspecific bronchitis. No pneumonia." -ABG showed pH of 7.45, PCO2 29.6, PO2 of 187, bicarbonate level of 20.1 and an ABG  O2 saturation of 100% -Check PCT 0.41 and slightly worsened to 0.71 -Trend Lactic acid and went from 1.1 is now 0.8 - Cefepime, metronidazole, vancomycin started given unknown source -Blood cultures and urine culture obtained however source is unlikely to be the urine as her urinalysis was clear which showed negative nitrites, negative leukocytes and no bacteria seen -  Progressive unit, telemetry - IVF with NS at 100cc/hr now stopped by nephrology -Patient's cortisol level is 8.8 - Oxygen to keep O2 sats > 90% - Continue asthma treatment, hold off on steroids given acute infection -Continue with acetaminophen for antipyretics - In an immunocompromised patient, it would be prudent to get ID involved but will await to see if she had a blood cultures grow and then consult them - Initial COVID testing negative in ED, follow up PCR testing sent and this was negative as well -Her sepsis physiology is improved significantly as she is now afebrile and is not tachycardic or tachypneic and blood pressure remained stable  Supraventricular Tachycardia -Heart rates were into the 190s yesterday and she was given adenosine with conversion -Continue monitor telemetry in the progressive unit  PCKD s/p transplant - Continue immunosuppressive medications nephrology was consulted and they recommending continuing them - Follow up cultures; she is at risk for fungal infections and concomitant viral infections like JC virus and BK virus.  It is reassuring that her renal function is stable.  -Her BUNs/creatinine is now 18/0.78 -Continue monitor and trend and avoid nephrotoxic medications, contrast dyes, hypotension and renally adjust medications -Repeat CMP in a.m.    Leg Edema -Per Patient is Chronic but will r/o DVT -Check LE Venous Duplex  Asthma, mild intermittent - Continue ICS/LABA - Continue prn albuterol  -Repeat chest x-ray in the a.m.    GERD (gastroesophageal reflux disease) - Continue PPI    Chronic diastolic CHF (congestive heart failure) Hypoalbuminemia -Last TTE in may showed indeterminate diastolic parameters -She has some mild swelling and crackles in the bases - If respiratory status worsens, would consider a dose of lasix to see if this improves oxygenation -Continue to monitor volume status carefully and IV fluids have now been stopped by  nephrology  Bronchitis, mucopurulent recurrent  - Per chart review from 2015 by Dr. Gwenette Greet: "The patient has no evidence for bronchiectasis on her high-resolution CT, and there is no evidence for hypogammaglobulinemia. One possibility for her recurrent pulmonary symptoms/infection is that it may be related to her aggressive immunosuppressive regimen. I have asked her to discuss this possibility with her transplant team. Her pulmonary function studies are not overly impressive, but there is the suggestion of subtle airflow obstruction by her flow volume loop. Her history is suggestive of recurrent asthmatic bronchitis, and therefore I would like to try her on a LABA/ICS to see if that makes a difference. I will use an inhaler with a low inhaled steroid dose since she is on low-dose prednisone for her transplant. I would also like to do a scan of her sinuses to make sure that is not the source of her recurrent infections/symptoms. If this is unremarkable, I can really find no source for her recurrent symptoms, and we need to strongly consider the role that her immunosuppression may be playing." - Curious if this may be another recurrence related to her transplant medications as CXR showed "Shallow inspiration with stable cardiomegaly, mild pulmonary edema and trace bilateral pleural effusion.  Streaky infrahilar consolidation, probably a nonspecific bronchitis. No pneumonia." -If no source of infection is  found on blood cultures will consult infectious disease for further evaluation  Mild Hyponatremia -Was 132 on admission and is now 136 after fluid resuscitation -Continue to monitor and trend and repeat CMP in a.m.  Metabolic Acidosis -Mild as CO2 is 21, chloride is 107 and anion gap is 8 -IV fluid hydration is now stopped  -Continue to monitor and trend and repeat CMP in a.m.  Normocytic Anemia -Patient's hemoglobin/hematocrit is 7.6/27.7 -Check anemia panel in the a.m. -Continue to monitor  for signs and symptoms of bleeding; currently no overt bleeding note -Repeat CBC in a.m.  Recent Hip Surgery -Patient complaining of Left Hip Pain -Obtain X-Ray  -Asking for Gabapentin and will need to make sure it is ok with Nephrology to gvie  DVT prophylaxis: (Lovenox/Heparin/SCD's/anticoagulated/None (if comfort care) Code Status: FULL CODE  Family Communication: No Family present at bedside  Disposition Plan: Patient is from home and likely can be discharged when she has further clinical improvement and source of her infection is known with antibiotics deescalated and she is weaned off of oxygen she does not wear oxygen at home.  Consultants:   Nephrology    Procedures: None   Antimicrobials:  Anti-infectives (From admission, onward)   Start     Dose/Rate Route Frequency Ordered Stop   02/01/20 2200  vancomycin (VANCOCIN) IVPB 1000 mg/200 mL premix     1,000 mg 200 mL/hr over 60 Minutes Intravenous Every 24 hours 01/31/20 2155     01/31/20 2200  ceFEPIme (MAXIPIME) 2 g in sodium chloride 0.9 % 100 mL IVPB     2 g 200 mL/hr over 30 Minutes Intravenous Every 8 hours 01/31/20 2155     01/31/20 2200  vancomycin (VANCOREADY) IVPB 1750 mg/350 mL     1,750 mg 175 mL/hr over 120 Minutes Intravenous  Once 01/31/20 2155 02/01/20 0238   01/31/20 2145  metroNIDAZOLE (FLAGYL) IVPB 500 mg     500 mg 100 mL/hr over 60 Minutes Intravenous  Once 01/31/20 2141 01/31/20 2308     Subjective: Seen and examined at bedside and she was feeling much better.  No nausea or vomiting.  Complaining of some left.  States that she was a little short of breath And states that happened pretty insidiously.  No lightheadedness or dizziness.  No other concerns or complaints at this time.  Objective: Vitals:   02/01/20 1330 02/01/20 1345 02/01/20 1400 02/01/20 1451  BP: 125/60 123/62 (!) 124/59 126/65  Pulse: 87 88 86 83  Resp: 17 18 18    Temp:   (!) 97.4 F (36.3 C) 97.9 F (36.6 C)  TempSrc:    Oral Oral  SpO2: 92% 94% 96% 99%  Weight:      Height:        Intake/Output Summary (Last 24 hours) at 02/01/2020 1901 Last data filed at 02/01/2020 0825 Gross per 24 hour  Intake 100 ml  Output -  Net 100 ml   Filed Weights   01/31/20 1948  Weight: 80 kg   Examination: Physical Exam:  Constitutional: WN/WD obese Caucasian female currently in NAD and appears calm but slightly uncomfortable Eyes: Lids and conjunctivae normal, sclerae anicteric  ENMT: External Ears, Nose appear normal. Grossly normal hearing. Neck: Appears normal, supple, no cervical masses, normal ROM, no appreciable thyromegaly Respiratory: Slightly diminished to auscultation bilaterally with mildly coarse, no wheezing, rales, rhonchi or crackles. Normal respiratory effort and patient is not tachypenic. No accessory muscle use.  Wearing supplemental oxygen via nasal cannula Cardiovascular: RRR, no  murmurs / rubs / gallops. S1 and S2 auscultated.  Has worsening leg edema on the left compared to the right Abdomen: Soft, non-tender, distended secondary body habitus. Bowel sounds positive.  GU: Deferred. Musculoskeletal: No clubbing / cyanosis of digits/nails. No joint deformity upper and lower extremities.  Skin: No rashes, lesions, ulcers on a limited skin evaluation. No induration; Warm and dry.  Neurologic: CN 2-12 grossly intact with no focal deficits. Romberg sign cerebellar reflexes not assessed.  Psychiatric: Normal judgment and insight. Alert and oriented x 3. Normal mood and appropriate affect.   Data Reviewed: I have personally reviewed following labs and imaging studies  CBC: Recent Labs  Lab 01/31/20 2007 01/31/20 2229 02/01/20 0628  WBC 9.2  --  9.8  NEUTROABS 8.1*  --   --   HGB 8.7* 7.5* 7.6*  HCT 30.3* 22.0* 27.7*  MCV 98.1  --  98.6  PLT 293  --  240   Basic Metabolic Panel: Recent Labs  Lab 01/31/20 2007 01/31/20 2229 02/01/20 0628 02/01/20 0848  NA 133* 132*  --  136  K 4.4 4.7   --  5.1  CL 102  --   --  107  CO2 22  --   --  21*  GLUCOSE 136*  --   --  173*  BUN 13  --   --  18  CREATININE 0.92  --  0.89 0.78  CALCIUM 9.2  --   --  8.8*  MG  --   --   --  2.1  PHOS  --   --   --  4.5   GFR: Estimated Creatinine Clearance: 71.5 mL/min (by C-G formula based on SCr of 0.78 mg/dL). Liver Function Tests: Recent Labs  Lab 01/31/20 2007 02/01/20 0848  AST 14* 13*  ALT 8 8  ALKPHOS 143* 130*  BILITOT 1.2 1.1  PROT 6.9 6.5  ALBUMIN 2.5* 2.3*   No results for input(s): LIPASE, AMYLASE in the last 168 hours. No results for input(s): AMMONIA in the last 168 hours. Coagulation Profile: Recent Labs  Lab 01/31/20 2007 02/01/20 9735  INR 1.2 1.4*   Cardiac Enzymes: No results for input(s): CKTOTAL, CKMB, CKMBINDEX, TROPONINI in the last 168 hours. BNP (last 3 results) No results for input(s): PROBNP in the last 8760 hours. HbA1C: No results for input(s): HGBA1C in the last 72 hours. CBG: No results for input(s): GLUCAP in the last 168 hours. Lipid Profile: No results for input(s): CHOL, HDL, LDLCALC, TRIG, CHOLHDL, LDLDIRECT in the last 72 hours. Thyroid Function Tests: No results for input(s): TSH, T4TOTAL, FREET4, T3FREE, THYROIDAB in the last 72 hours. Anemia Panel: No results for input(s): VITAMINB12, FOLATE, FERRITIN, TIBC, IRON, RETICCTPCT in the last 72 hours. Sepsis Labs: Recent Labs  Lab 01/31/20 2007 02/01/20 3299 02/01/20 0835  PROCALCITON 0.41 0.71  --   LATICACIDVEN 1.1  --  0.8    Recent Results (from the past 240 hour(s))  SARS CORONAVIRUS 2 (TAT 6-24 HRS) Nasopharyngeal Nasopharyngeal Swab     Status: None   Collection Time: 01/31/20 12:54 AM   Specimen: Nasopharyngeal Swab  Result Value Ref Range Status   SARS Coronavirus 2 NEGATIVE NEGATIVE Final    Comment: (NOTE) SARS-CoV-2 target nucleic acids are NOT DETECTED. The SARS-CoV-2 RNA is generally detectable in upper and lower respiratory specimens during the acute phase of  infection. Negative results do not preclude SARS-CoV-2 infection, do not rule out co-infections with other pathogens, and should not be used as  the sole basis for treatment or other patient management decisions. Negative results must be combined with clinical observations, patient history, and epidemiological information. The expected result is Negative. Fact Sheet for Patients: SugarRoll.be Fact Sheet for Healthcare Providers: https://www.woods-mathews.com/ This test is not yet approved or cleared by the [place] FDA and  has been authorized for detection and/or diagnosis of SARS-CoV-2 by FDA under an Emergency Use Authorization (EUA). This EUA will remain  in effect (meaning this test can be used) for the duration of the COVID-19 declaration under Section 56 4(b)(1) of the Act, 21 U.S.C. section 360bbb-3(b)(1), unless the authorization is terminated or revoked sooner. Performed at [facility], [place] [address] [place], [place] [address]   Blood Culture (routine x 2)     Status: None (Preliminary result)   Collection Time: 01/31/20  8:15 PM   Specimen: BLOOD RIGHT HAND  Result Value Ref Range Status   Specimen Description BLOOD RIGHT HAND  Final   Special Requests   Final    BOTTLES DRAWN AEROBIC AND ANAEROBIC Blood Culture adequate volume   Culture   Final    NO GROWTH < 12 HOURS Performed at [facility], [place] [address], [place], [place] [address]    Report Status PENDING  Incomplete  Blood Culture (routine x 2)     Status: None (Preliminary result)   Collection Time: 02/01/20  8:58 AM   Specimen: BLOOD RIGHT ARM  Result Value Ref Range Status   Specimen Description BLOOD RIGHT ARM  Final   Special Requests   Final    AEROBIC BOTTLE ONLY Blood Culture results may not be optimal due to an inadequate volume of blood received in culture bottles   Culture PENDING  Incomplete   Report Status PENDING  Incomplete     RN  Pressure Injury Documentation: Pressure Injury 02/01/20 Buttocks Left (Active)  02/01/20 1457  Location: Buttocks  Location Orientation: Left  Staging:   Wound Description (Comments):   Present on Admission: Yes     Estimated body mass index is 32.26 kg/m as calculated from the following:   Height as of this encounter: 5\' 2"  (1.575 m).   Weight as of this encounter: 80 kg.  Malnutrition Type:      Malnutrition Characteristics:      Nutrition Interventions:     Radiology Studies: DG Chest Port 1 View  Result Date: 01/31/2020 CLINICAL DATA:  Dyspnea. EXAM: PORTABLE CHEST 1 VIEW COMPARISON:  January 07, 2020. FINDINGS: Inspiration remains shallow. There is stable cardiomegaly with central pulmonary vascular congestion, trace left pleural effusion and mild pulmonary edema. Telemetry leads preclude evaluation of the right lateral costophrenic angle for any residual right pleural effusion. Trace fluid continues to layer along the minor fissure. Bilateral infrahilar streaky consolidation probably represents bronchitis. There is no convincing evidence for pneumonia. There is no pneumothorax. Diffuse bone demineralization and mild skeletal degenerative changes are present. There are telemetry leads. IMPRESSION: Shallow inspiration with stable cardiomegaly, mild pulmonary edema and trace bilateral pleural effusion. Streaky infrahilar consolidation, probably a nonspecific bronchitis. No pneumonia. Electronically Signed   By: Revonda Humphrey   On: 01/31/2020 21:17   Scheduled Meds: . allopurinol  300 mg Oral Daily  . aspirin  325 mg Oral Q breakfast  . brimonidine  1 drop Left Eye BID  . docusate sodium  100 mg Oral BID  . dorzolamide  1 drop Both Eyes Daily  . enoxaparin (LOVENOX) injection  40 mg Subcutaneous Daily  .  FLUoxetine  40 mg Oral Daily  . gabapentin  200 mg Oral TID  . latanoprost  1 drop Both Eyes QHS  . magnesium oxide  400 mg Oral BID  . mometasone-formoterol  2 puff  Inhalation BID  . mycophenolate  180 mg Oral BID  . pantoprazole  40 mg Oral BID  . [START ON 02/02/2020] predniSONE  7.5 mg Oral Q breakfast  . saccharomyces boulardii  250 mg Oral BID  . sodium chloride flush  3 mL Intravenous Q12H  . tacrolimus  0.5 mg Oral QHS  . tacrolimus  1 mg Oral Daily   Continuous Infusions: . ceFEPime (MAXIPIME) IV 2 g (02/01/20 1509)  . vancomycin      LOS: 1 day   Kerney Elbe, DO [facility] PAGER is on [place]  If 7PM-7AM, please contact night-coverage [email or web address]

## 2020-02-01 NOTE — ED Provider Notes (Signed)
.  Cardioversion  Date/Time: 02/01/2020 8:30 PM Performed by: Carmin Muskrat, MD Authorized by: Carmin Muskrat, MD   Consent:    Consent obtained:  Verbal   Consent given by:  Patient   Risks discussed:  Induced arrhythmia and pain   Alternatives discussed:  No treatment and rate-control medication Pre-procedure details:    Cardioversion basis:  Emergent   Rhythm:  Supraventricular tachycardia   Electrode placement:  Anterior-posterior Patient sedated: No Post-procedure details:    Patient status:  Awake   Patient tolerance of procedure:  Tolerated well, no immediate complications Comments:     With adenosine 6 mg, the patient had appropriate cardioversion on 1 attempt.        Carmin Muskrat, MD 02/01/20 [id number]

## 2020-02-01 NOTE — Plan of Care (Signed)

## 2020-02-02 ENCOUNTER — Inpatient Hospital Stay (HOSPITAL_COMMUNITY): Payer: Medicare Other

## 2020-02-02 ENCOUNTER — Encounter (HOSPITAL_COMMUNITY): Payer: Medicare Other

## 2020-02-02 DIAGNOSIS — R7989 Other specified abnormal findings of blood chemistry: Secondary | ICD-10-CM

## 2020-02-02 DIAGNOSIS — R609 Edema, unspecified: Secondary | ICD-10-CM

## 2020-02-02 LAB — COMPREHENSIVE METABOLIC PANEL
ALT: 7 U/L (ref 0–44)
AST: 12 U/L — ABNORMAL LOW (ref 15–41)
Albumin: 2.2 g/dL — ABNORMAL LOW (ref 3.5–5.0)
Alkaline Phosphatase: 111 U/L (ref 38–126)
Anion gap: 9 (ref 5–15)
BUN: 25 mg/dL — ABNORMAL HIGH (ref 8–23)
CO2: 21 mmol/L — ABNORMAL LOW (ref 22–32)
Calcium: 9.3 mg/dL (ref 8.9–10.3)
Chloride: 111 mmol/L (ref 98–111)
Creatinine, Ser: 0.91 mg/dL (ref 0.44–1.00)
GFR calc Af Amer: >60 mL/min (ref >60)
GFR calc non Af Amer: >60 mL/min (ref >60)
Glucose, Bld: 134 mg/dL — ABNORMAL HIGH (ref 70–99)
Potassium: 5.3 mmol/L — ABNORMAL HIGH (ref 3.5–5.1)
Sodium: 141 mmol/L (ref 135–145)
Total Bilirubin: 0.6 mg/dL (ref 0.3–1.2)
Total Protein: 6.1 g/dL — ABNORMAL LOW (ref 6.5–8.1)

## 2020-02-02 LAB — CBC WITH DIFFERENTIAL/PLATELET
Abs Immature Granulocytes: 0.07 10*3/uL (ref 0.00–0.07)
Basophils Absolute: 0 10*3/uL (ref 0.0–0.1)
Basophils Relative: 0 %
Eosinophils Absolute: 0 10*3/uL (ref 0.0–0.5)
Eosinophils Relative: 0 %
HCT: 26.1 % — ABNORMAL LOW (ref 36.0–46.0)
Hemoglobin: 7.4 g/dL — ABNORMAL LOW (ref 12.0–15.0)
Immature Granulocytes: 1 %
Lymphocytes Relative: 6 %
Lymphs Abs: 0.5 10*3/uL — ABNORMAL LOW (ref 0.7–4.0)
MCH: 27.6 pg (ref 26.0–34.0)
MCHC: 28.4 g/dL — ABNORMAL LOW (ref 30.0–36.0)
MCV: 97.4 fL (ref 80.0–100.0)
Monocytes Absolute: 0.3 10*3/uL (ref 0.1–1.0)
Monocytes Relative: 4 %
Neutro Abs: 7.2 10*3/uL (ref 1.7–7.7)
Neutrophils Relative %: 89 %
Platelets: 230 10*3/uL (ref 150–400)
RBC: 2.68 MIL/uL — ABNORMAL LOW (ref 3.87–5.11)
RDW: 20.1 % — ABNORMAL HIGH (ref 11.5–15.5)
WBC: 8.1 10*3/uL (ref 4.0–10.5)
nRBC: 0 % (ref 0.0–0.2)

## 2020-02-02 LAB — MAGNESIUM: Magnesium: 2.2 mg/dL (ref 1.7–2.4)

## 2020-02-02 LAB — PHOSPHORUS: Phosphorus: 4.1 mg/dL (ref 2.5–4.6)

## 2020-02-02 LAB — D-DIMER, QUANTITATIVE: D-Dimer, Quant: 1.46 ug/mL-FEU — ABNORMAL HIGH (ref 0.00–0.50)

## 2020-02-02 MED ORDER — SODIUM CHLORIDE 0.9 % IV SOLN
2.0000 g | INTRAVENOUS | Status: DC
  Administered 2020-02-02 – 2020-02-05 (×4): 2 g via INTRAVENOUS
  Filled 2020-02-02: qty 20
  Filled 2020-02-02: qty 2
  Filled 2020-02-02: qty 20
  Filled 2020-02-02: qty 2
  Filled 2020-02-02 (×2): qty 20

## 2020-02-02 MED ORDER — ORAL CARE MOUTH RINSE
15.0000 mL | Freq: Two times a day (BID) | OROMUCOSAL | Status: DC
  Administered 2020-02-03 – 2020-02-06 (×5): 15 mL via OROMUCOSAL

## 2020-02-02 MED ORDER — TECHNETIUM TO 99M ALBUMIN AGGREGATED
1.5500 | Freq: Once | INTRAVENOUS | Status: AC | PRN
  Administered 2020-02-02: 1.55 via INTRAVENOUS

## 2020-02-02 MED ORDER — GUAIFENESIN ER 600 MG PO TB12
1200.0000 mg | ORAL_TABLET | Freq: Two times a day (BID) | ORAL | Status: DC
  Administered 2020-02-02 – 2020-02-06 (×8): 1200 mg via ORAL
  Filled 2020-02-02 (×8): qty 2

## 2020-02-02 MED ORDER — FUROSEMIDE 10 MG/ML IJ SOLN
40.0000 mg | Freq: Once | INTRAMUSCULAR | Status: AC
  Administered 2020-02-02: 40 mg via INTRAVENOUS
  Filled 2020-02-02: qty 4

## 2020-02-02 NOTE — Progress Notes (Signed)
PROGRESS NOTE    April [name]  LNL:[id number] DOB: 1956/03/22 DOA: 01/31/2020 PCP: [name], MD   Brief Narrative:  HPI per Dr. Gilles Chiquito on 01/31/20 April [name] is a 64 y.o. 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  She reports that her symptoms started this morning.  She became more and more short of breath.  She has this issue sometimes and takes her home asthma medications, however, she was not able to get the symptoms under control.  Usually when she cannot get it under control, she will come to the hospital to take steroids.  She was noted by EMS to have saturation to 74% on RA.  She improved with medications to 90% on RA and then to 96% with 2LNC.  She further had an elevated HR with palpitations, fever to 103F which improved with tylenol and tachypnea.  She notes 1 day of productive cough, chills, sneezing, sinus congestion, nausea.  She has had no sick contacts and no contacts with coronavirus.  She is remaining at home and waiting to get her vaccine.  She denied any urinary symptoms, change in urination, chest pain, pain with deep breathing.  She does have chronic skin changes, a small area of soreness on her left buttock and some chronic LE swelling, worse on the left.  She notes that she had a hip replacement in January and also had pelvic fractures which have limited her mobility recently.  She has no headache or vision changes.    ED Course: In the ED, she was found to be Febrile with an elevated HR and RR with hypoxia.  She was noted to have a normal WBC.  CXR showed only pulmonary edema, but no obvious pneumonia.  She may have had a bronchitis.  Blood cultures and urine culture were obtained.  She was started on antibiotics for an unknown source in an immunocompromised person.  Initial COVID Ag testing is negative  **Interim History She states that  she is feeling much better nephrology was consulted for further evaluation recommendations.  Nephrology recommends continuing her current Immunosuppressive medications and they have stopped IV fluids.  Broad-spectrum antibiotics were changed to IV ceftriaxone after discussion with Dr. Graylon Good of infectious diseases.  Dr. Graylon Good was concerned for possible PE however her VQ scan shows very low probability for pulmonary embolus.  Her lower extremity duplex negative for DVT in the left leg.  Patient still continued to remain short of breath and may need to try some Lasix but will need to discuss with nephrology.  Assessment & Plan:   Active Problems:   Bronchitis, mucopurulent recurrent (HCC)   Asthma, mild intermittent   GERD (gastroesophageal reflux disease)   Hyperlipidemia   Chronic diastolic CHF (congestive heart failure) (HCC)   Acute respiratory failure with hypoxia (HCC)   Sepsis (HCC)  Sepsis in a transplant recipient, source unknown likely it was from her mucopurulent bronchitis Acute respiratory failure with hypoxia  in the setting of above versus an acute on chronic diastolic CHF exacerbation or anemia -She currently does not have a leukocytosis anymore and her WBC is now 8.1 - Sources include lung, skin, urinary, implanted material (fistula) unclear what her etiology is -Though this could certainly be COVID related this is highly unlikely, she does not have a CXR which is consistent with viral pneumonia, she does not have lymphopenia and no contacts.  She  is at high risk for a bacterial infection given her immunosuppresion, and based on chart review, she has had recurrent mucopurulent bronchitis in the past -Initial CXR did show "Shallow inspiration with stable cardiomegaly, mild pulmonary edema and trace bilateral pleural effusion. Streaky infrahilar consolidation, probably a nonspecific bronchitis. No pneumonia." -ABG showed pH of 7.45, PCO2 29.6, PO2 of 187, bicarbonate level of 20.1  and an ABG O2 saturation of 100% -Check PCT 0.41 and slightly worsened to 0.71 -Trend Lactic acid and went from 1.1 is now 0.8 - Cefepime, metronidazole, vancomycin started given unknown source but will change to IV ceftriaxone given infectious disease recommendations -Blood cultures and urine culture obtained however source is unlikely to be the urine as her urinalysis was clear which showed negative nitrites, negative leukocytes and no bacteria seen - Progressive unit, telemetry - IVF with NS at 100cc/hr now stopped by nephrology -Patient's cortisol level is 8.8 - Oxygen to keep O2 sats > 90% - Continue asthma treatment, hold off on steroids given acute infection -Continue with acetaminophen for antipyretics -Spoke with Dr. Baxter Flattery of infectious diseases who is less concerned about infection and more concerned about possible PE -VQ scan was obtained given patient's renal status and was very low probability for PE.  Her D-dimer is slightly elevated at 1.46 but her lower extremity duplex from the left leg was negative for any VTE -Repeat chest x-ray today showed "Stable cardiomediastinal silhouette. No pneumothorax or pleural effusion is noted. Left lung is clear. Minimal right basilar subsegmental atelectasis is noted. The visualized skeletal structures are unremarkable." -Continue to monitor she has been afebrile now and has no leukocytosis however she still remains on 2 L supplemental oxygen -May need some diuresis given that she did have an elevated BNP on admission - Initial COVID testing negative in ED, follow up PCR testing sent and this was negative as well -Her sepsis physiology is improved significantly as she is now afebrile and is not tachycardic or tachypneic and blood pressure remained stable  Supraventricular Tachycardia status post conversion to normal sinus rhythm -Heart rates were into the 190s yesterday and she was given adenosine with conversion -Continue monitor telemetry in  the progressive unit  PCKD s/p transplant - Continue immunosuppressive medications nephrology was consulted and they recommending continuing them - Follow up cultures; she is at risk for fungal infections and concomitant viral infections like JC virus and BK virus.  It is reassuring that her renal function is stable.  -Her BUNs/creatinine is now 18/0.78 -Continue monitor and trend and avoid nephrotoxic medications, contrast dyes, hypotension and renally adjust medications; wanted to order a CTA of the chest to rule out PE however substituted this with the VQ scan and VQ scan showed low probability for pulmonary embolus with no perfusion defects -Repeat CMP in a.m.    Leg Edema -Per Patient is Chronic but will r/o DVT -Check LE Venous Duplex and left side did not show any blood clot  Asthma, mild intermittent - Continue ICS/LABA - Continue prn albuterol  -Repeat chest x-ray this AM showed "Stable cardiomediastinal silhouette. No pneumothorax or pleural effusion is noted. Left lung is clear. Minimal right basilar subsegmental atelectasis is noted. The visualized skeletal structures are unremarkable." -We will order a peak flow meter continue with incentive spirometry and flutter valve -Continue guaifenesin    GERD (gastroesophageal reflux disease) - Continue PPI    Acute on chronic Diastolic CHF (congestive heart failure) Hypoalbuminemia -Last TTE in May showed indeterminate diastolic parameters -She has some  mild swelling and crackles in the bases and still is short of breath and requires 2 L supplemental oxygen -We will try a dose of IV Lasix 40 mg x 1 -Continue to monitor volume status carefully and IV fluids have now been stopped by nephrology -Strict I's and O's and daily weights -She is -719.5 mL since admission however question if this is accurate -BNP was elevated on admission was 574.8 -Continue to monitor for signs and symptoms of volume overload  Bronchitis, mucopurulent  recurrent  - Per chart review from 2015 by Dr. Gwenette Greet: "The patient has no evidence for bronchiectasis on her high-resolution CT, and there is no evidence for hypogammaglobulinemia. One possibility for her recurrent pulmonary symptoms/infection is that it may be related to her aggressive immunosuppressive regimen. I have asked her to discuss this possibility with her transplant team. Her pulmonary function studies are not overly impressive, but there is the suggestion of subtle airflow obstruction by her flow volume loop. Her history is suggestive of recurrent asthmatic bronchitis, and therefore I would like to try her on a LABA/ICS to see if that makes a difference. I will use an inhaler with a low inhaled steroid dose since she is on low-dose prednisone for her transplant. I would also like to do a scan of her sinuses to make sure that is not the source of her recurrent infections/symptoms. If this is unremarkable, I can really find no source for her recurrent symptoms, and we need to strongly consider the role that her immunosuppression may be playing." - Curious if this may be another recurrence related to her transplant medications as CXR showed "Shallow inspiration with stable cardiomegaly, mild pulmonary edema and trace bilateral pleural effusion.  Streaky infrahilar consolidation, probably a nonspecific bronchitis. No pneumonia." -See above -Continue with guaifenesin, flutter valve and incentive spirometry  Mild Hyponatremia -Was 132 on admission and is now 141 -Continue to monitor and trend and repeat CMP in a.m.  Metabolic Acidosis -Mild as CO2 is 21, chloride is 111 and anion gap is 9 -IV fluid hydration is now stopped  -Continue to monitor and trend and repeat CMP in a.m.  Hyperkalemia -Mild and patient states that her potassium runs around 5 usually next-patient's potassium today is 5.3 -We will give a dose of IV Lasix 40 mg x 1 X-nephrology recommending to start Lokelma 10 mg p.o.  daily if potassium gets over 5.5  Normocytic Anemia -Patient's hemoglobin/hematocrit is 7.6/27.7 -Check anemia panel in the a.m. -Continue to monitor for signs and symptoms of bleeding; currently no overt bleeding note -Repeat CBC in a.m.  Recent Hip Surgery -Patient complaining of Left Hip Pain -Obtain X-Ray showed "Changes consistent with prior left hip replacement. Healing pubic rami fractures on the left are noted. Increased fragmentation of the greater trochanter fragment seen on the prior exam." -We will resume her gabapentin  Glaucoma -Continue with home eyedrops of dorzolamide, latanoprost and brimonidine  DVT prophylaxis: Enoxaparin 40 mg sq q24h Code Status: FULL CODE  Family Communication: No Family present at bedside  Disposition Plan: Patient is from home and likely can be discharged when she has further clinical improvement and source of her infection is known with antibiotics deescalated and she is weaned off of oxygen she does not wear oxygen at home.  Consultants:   Nephrology    Procedures: None   Antimicrobials:  Anti-infectives (From admission, onward)   Start     Dose/Rate Route Frequency Ordered Stop   02/02/20 1000  cefTRIAXone (ROCEPHIN) 2  g in sodium chloride 0.9 % 100 mL IVPB     2 g 200 mL/hr over 30 Minutes Intravenous Every 24 hours 02/02/20 0918     02/01/20 2200  vancomycin (VANCOCIN) IVPB 1000 mg/200 mL premix  Status:  Discontinued     1,000 mg 200 mL/hr over 60 Minutes Intravenous Every 24 hours 01/31/20 2155 02/02/20 0918   01/31/20 2200  ceFEPIme (MAXIPIME) 2 g in sodium chloride 0.9 % 100 mL IVPB  Status:  Discontinued     2 g 200 mL/hr over 30 Minutes Intravenous Every 8 hours 01/31/20 2155 02/02/20 0918   01/31/20 2200  vancomycin (VANCOREADY) IVPB 1750 mg/350 mL     1,750 mg 175 mL/hr over 120 Minutes Intravenous  Once 01/31/20 2155 02/01/20 0238   01/31/20 2145  metroNIDAZOLE (FLAGYL) IVPB 500 mg     500 mg 100 mL/hr over 60  Minutes Intravenous  Once 01/31/20 2141 01/31/20 2308     Subjective: Seen and examined at bedside and thinks her shortness of breath is slightly better but still feels short of breath.  No nausea or vomiting.  No lightheadedness or dizziness and still requiring 2 L of oxygen via CAT scan.  Denies any chest pain.  No other concerns or complaints at this time.  Objective: Vitals:   02/01/20 1927 02/01/20 1930 02/02/20 0511 02/02/20 0857  BP:  (!) 114/57 100/64   Pulse:  92 76   Resp:   17   Temp:  98.5 F (36.9 C) (!) 97.5 F (36.4 C)   TempSrc:  Oral Oral   SpO2: 100% 100% 100% 100%  Weight:   78 kg   Height:        Intake/Output Summary (Last 24 hours) at 02/02/2020 1634 Last data filed at 02/02/2020 1500 Gross per 24 hour  Intake 380.54 ml  Output 1200 ml  Net -819.46 ml   Filed Weights   01/31/20 1948 02/02/20 0511  Weight: 80 kg 78 kg   Examination: Physical Exam:  Constitutional: WN/WD obese Caucasian female currently no acute distress appears calm Eyes: Lids and conjunctivae normal, sclerae anicteric  ENMT: External Ears, Nose appear normal. Grossly normal hearing.  Neck: Appears normal, supple, no cervical masses, normal ROM, no appreciable thyromegaly neck: No JVD Respiratory: Diminished to auscultation bilaterally with coarse breath sounds and some mild crackles, no wheezing, rales, rhonchi. Normal respiratory effort and patient is not tachypenic. No accessory muscle use.  Wearing 2 L of supplemental oxygen via nasal cannula Cardiovascular: RRR, no murmurs / rubs / gallops. S1 and S2 auscultated.  Has 1+ extremity edema on the left compared to the right Abdomen: Soft, non-tender, distended secondary to body habitus. No masses palpated. No appreciable hepatosplenomegaly. Bowel sounds positive x4  GU: Deferred. Musculoskeletal: No clubbing / cyanosis of digits/nails. No joint deformity upper and lower extremities.  Skin: No rashes, lesions, ulcers on limited skin  evaluation. No induration; Warm and dry.  Neurologic: CN 2-12 grossly intact with no focal deficits. Romberg sig and cerebellar reflexes not assessed.  Psychiatric: Normal judgment and insight. Alert and oriented x 3. Normal mood and appropriate affect.   Data Reviewed: I have personally reviewed following labs and imaging studies  CBC: Recent Labs  Lab 01/31/20 2007 01/31/20 2229 02/01/20 0628 02/02/20 0347  WBC 9.2  --  9.8 8.1  NEUTROABS 8.1*  --   --  7.2  HGB 8.7* 7.5* 7.6* 7.4*  HCT 30.3* 22.0* 27.7* 26.1*  MCV 98.1  --  98.6  97.4  PLT 293  --  247 902   Basic Metabolic Panel: Recent Labs  Lab 01/31/20 2007 01/31/20 2229 02/01/20 0628 02/01/20 0848 02/02/20 0347  NA 133* 132*  --  136 141  K 4.4 4.7  --  5.1 5.3*  CL 102  --   --  107 111  CO2 22  --   --  21* 21*  GLUCOSE 136*  --   --  173* 134*  BUN 13  --   --  18 25*  CREATININE 0.92  --  0.89 0.78 0.91  CALCIUM 9.2  --   --  8.8* 9.3  MG  --   --   --  2.1 2.2  PHOS  --   --   --  4.5 4.1   GFR: Estimated Creatinine Clearance: 62 mL/min (by C-G formula based on SCr of 0.91 mg/dL). Liver Function Tests: Recent Labs  Lab 01/31/20 2007 02/01/20 0848 02/02/20 0347  AST 14* 13* 12*  ALT 8 8 7   ALKPHOS 143* 130* 111  BILITOT 1.2 1.1 0.6  PROT 6.9 6.5 6.1*  ALBUMIN 2.5* 2.3* 2.2*   No results for input(s): LIPASE, AMYLASE in the last 168 hours. No results for input(s): AMMONIA in the last 168 hours. Coagulation Profile: Recent Labs  Lab 01/31/20 2007 02/01/20 4097  INR 1.2 1.4*   Cardiac Enzymes: No results for input(s): CKTOTAL, CKMB, CKMBINDEX, TROPONINI in the last 168 hours. BNP (last 3 results) No results for input(s): PROBNP in the last 8760 hours. HbA1C: No results for input(s): HGBA1C in the last 72 hours. CBG: No results for input(s): GLUCAP in the last 168 hours. Lipid Profile: No results for input(s): CHOL, HDL, LDLCALC, TRIG, CHOLHDL, LDLDIRECT in the last 72 hours. Thyroid  Function Tests: No results for input(s): TSH, T4TOTAL, FREET4, T3FREE, THYROIDAB in the last 72 hours. Anemia Panel: No results for input(s): VITAMINB12, FOLATE, FERRITIN, TIBC, IRON, RETICCTPCT in the last 72 hours. Sepsis Labs: Recent Labs  Lab 01/31/20 2007 02/01/20 3532 02/01/20 0835  PROCALCITON 0.41 0.71  --   LATICACIDVEN 1.1  --  0.8    Recent Results (from the past 240 hour(s))  SARS CORONAVIRUS 2 (TAT 6-24 HRS) Nasopharyngeal Nasopharyngeal Swab     Status: None   Collection Time: 01/31/20 12:54 AM   Specimen: Nasopharyngeal Swab  Result Value Ref Range Status   SARS Coronavirus 2 NEGATIVE NEGATIVE Final    Comment: (NOTE) SARS-CoV-2 target nucleic acids are NOT DETECTED. The SARS-CoV-2 RNA is generally detectable in upper and lower respiratory specimens during the acute phase of infection. Negative results do not preclude SARS-CoV-2 infection, do not rule out co-infections with other pathogens, and should not be used as the sole basis for treatment or other patient management decisions. Negative results must be combined with clinical observations, patient history, and epidemiological information. The expected result is Negative. Fact Sheet for Patients: SugarRoll.be Fact Sheet for Healthcare Providers: https://www.woods-mathews.com/ This test is not yet approved or cleared by the [place] FDA and  has been authorized for detection and/or diagnosis of SARS-CoV-2 by FDA under an Emergency Use Authorization (EUA). This EUA will remain  in effect (meaning this test can be used) for the duration of the COVID-19 declaration under Section 56 4(b)(1) of the Act, 21 U.S.C. section 360bbb-3(b)(1), unless the authorization is terminated or revoked sooner. Performed at [facility], [place] [address], [name], [place] [address]   Blood Culture (routine x 2)  Status: None (Preliminary result)   Collection Time: 01/31/20   8:15 PM   Specimen: BLOOD RIGHT HAND  Result Value Ref Range Status   Specimen Description BLOOD RIGHT HAND  Final   Special Requests   Final    BOTTLES DRAWN AEROBIC AND ANAEROBIC Blood Culture adequate volume   Culture   Final    NO GROWTH 2 DAYS Performed at [facility], [address], [place], [place] [address]    Report Status PENDING  Incomplete  Urine culture     Status: Abnormal   Collection Time: 01/31/20  8:15 PM   Specimen: In/Out Cath Urine  Result Value Ref Range Status   Specimen Description IN/OUT CATH URINE  Final   Special Requests   Final    NONE Performed at [facility], [place] [address] [place], [place] [address]    Culture MULTIPLE SPECIES PRESENT, SUGGEST RECOLLECTION (A)  Final   Report Status 02/01/2020 FINAL  Final  Blood Culture (routine x 2)     Status: None (Preliminary result)   Collection Time: 02/01/20  8:58 AM   Specimen: BLOOD RIGHT ARM  Result Value Ref Range Status   Specimen Description BLOOD RIGHT ARM  Final   Special Requests   Final    AEROBIC BOTTLE ONLY Blood Culture results may not be optimal due to an inadequate volume of blood received in culture bottles   Culture   Final    NO GROWTH < 24 HOURS Performed at [facility], [place] [address], [place], [place] [address]    Report Status PENDING  Incomplete     RN Pressure Injury Documentation: Pressure Injury 02/01/20 Buttocks Left (Active)  02/01/20 1457  Location: Buttocks  Location Orientation: Left  Staging:   Wound Description (Comments):   Present on Admission: Yes     Estimated body mass index is 31.45 kg/m as calculated from the following:   Height as of this encounter: 5\' 2"  (1.575 m).   Weight as of this encounter: 78 kg.  Malnutrition Type:      Malnutrition Characteristics:      Nutrition Interventions:     Radiology Studies: DG Chest 2 View  Result Date: 02/02/2020 CLINICAL DATA:  Dyspnea. EXAM: CHEST - 2 VIEW COMPARISON:  January 31, 2020. FINDINGS: Stable cardiomediastinal silhouette. No pneumothorax or pleural effusion is noted. Left lung is clear. Minimal right basilar subsegmental atelectasis is noted. The visualized skeletal structures are unremarkable. IMPRESSION: Minimal right basilar subsegmental atelectasis. Electronically Signed   By: Marijo Conception M.D.   On: 02/02/2020 14:25   NM Pulmonary Perfusion  Result Date: 02/02/2020 CLINICAL DATA:  Shortness of breath EXAM: NUCLEAR MEDICINE PERFUSION LUNG SCAN TECHNIQUE: Perfusion images were obtained in multiple projections after intravenous injection of radiopharmaceutical. Ventilation scans intentionally deferred if perfusion scan and chest x-ray adequate for interpretation during COVID 19 epidemic. Views: Anterior, posterior, left lateral, right lateral, RPO, LPO, RAO, LAO RADIOPHARMACEUTICALS:  1.55 mCi Tc-20m MAA IV COMPARISON:  Chest radiograph January 31, 2020 FINDINGS: Radiotracer uptake bilaterally is homogeneous and symmetric. No focal perfusion defects evident. IMPRESSION: No perfusion defects evident. Very low probability of pulmonary embolus. Electronically Signed   By: Lowella Grip III M.D.   On: 02/02/2020 13:34   DG Chest Port 1 View  Result Date: 01/31/2020 CLINICAL DATA:  Dyspnea. EXAM: PORTABLE CHEST 1 VIEW COMPARISON:  January 07, 2020. FINDINGS: Inspiration remains shallow. There is stable cardiomegaly with central pulmonary vascular congestion, trace  left pleural effusion and mild pulmonary edema. Telemetry leads preclude evaluation of the right lateral costophrenic angle for any residual right pleural effusion. Trace fluid continues to layer along the minor fissure. Bilateral infrahilar streaky consolidation probably represents bronchitis. There is no convincing evidence for pneumonia. There is no pneumothorax. Diffuse bone demineralization and mild skeletal degenerative changes are present. There are telemetry leads. IMPRESSION: Shallow inspiration  with stable cardiomegaly, mild pulmonary edema and trace bilateral pleural effusion. Streaky infrahilar consolidation, probably a nonspecific bronchitis. No pneumonia. Electronically Signed   By: Revonda Humphrey   On: 01/31/2020 21:17   DG HIP UNILAT WITH PELVIS 2-3 VIEWS LEFT  Result Date: 02/01/2020 CLINICAL DATA:  Left hip pain for 1 day, no known injury, initial encounter EXAM: DG HIP (WITH OR WITHOUT PELVIS) 3V LEFT COMPARISON:  12/21/2019 FINDINGS: Left hip replacement is again seen and stable. The greater trochanter fracture fragment is again seen and slightly displaced when compare with the prior exam. Prior left pubic rami fractures are again noted. Callus formation in these fractures. No soft tissue abnormality is seen. IMPRESSION: Changes consistent with prior left hip replacement. Healing pubic rami fractures on the left are noted. Increased fragmentation of the greater trochanter fragment seen on the prior exam. Electronically Signed   By: Inez Catalina M.D.   On: 02/01/2020 21:41   VAS [place] LOWER EXTREMITY VENOUS (DVT)  Result Date: 02/02/2020  Lower Venous DVTStudy Indications: Edema.  Limitations: Poor ultrasound/tissue interface. Comparison Study: 12/16/2019-negative lower extremity venous duplex Performing Technologist: Maudry Mayhew MHA, RDMS, RVT, RDCS  Examination Guidelines: A complete evaluation includes B-mode imaging, spectral Doppler, color Doppler, and power Doppler as needed of all accessible portions of each vessel. Bilateral testing is considered an integral part of a complete examination. Limited examinations for reoccurring indications may be performed as noted. The reflux portion of the exam is performed with the patient in reverse Trendelenburg.  +---------+---------------+---------+-----------+----------+--------------+ LEFT     CompressibilityPhasicitySpontaneityPropertiesThrombus Aging +---------+---------------+---------+-----------+----------+--------------+ CFV       Full           Yes      Yes                                 +---------+---------------+---------+-----------+----------+--------------+ SFJ      Full                                                        +---------+---------------+---------+-----------+----------+--------------+ FV Prox  Full                                                        +---------+---------------+---------+-----------+----------+--------------+ FV Mid   Full                                                        +---------+---------------+---------+-----------+----------+--------------+ FV DistalFull                                                        +---------+---------------+---------+-----------+----------+--------------+  PFV      Full                                                        +---------+---------------+---------+-----------+----------+--------------+ POP      Full           Yes      Yes                                 +---------+---------------+---------+-----------+----------+--------------+ PTV      Full                                                        +---------+---------------+---------+-----------+----------+--------------+ PERO     Full                                                        +---------+---------------+---------+-----------+----------+--------------+     Summary: LEFT: - There is no evidence of deep vein thrombosis in the lower extremity.  - No cystic structure found in the popliteal fossa.  *See table(s) above for measurements and observations.    Preliminary    Scheduled Meds: . allopurinol  300 mg Oral Daily  . aspirin  325 mg Oral Q breakfast  . brimonidine  1 drop Left Eye BID  . docusate sodium  100 mg Oral BID  . dorzolamide  1 drop Both Eyes Daily  . enoxaparin (LOVENOX) injection  40 mg Subcutaneous Daily  . FLUoxetine  40 mg Oral Daily  . gabapentin  200 mg Oral TID  . latanoprost  1 drop Both Eyes QHS  .  magnesium oxide  400 mg Oral BID  . mouth rinse  15 mL Mouth Rinse BID  . mometasone-formoterol  2 puff Inhalation BID  . mycophenolate  180 mg Oral BID  . pantoprazole  40 mg Oral BID  . predniSONE  7.5 mg Oral Q breakfast  . saccharomyces boulardii  250 mg Oral BID  . sodium chloride flush  3 mL Intravenous Q12H  . tacrolimus  0.5 mg Oral QHS  . tacrolimus  1 mg Oral Daily   Continuous Infusions: . sodium chloride 500 mL (02/02/20 0603)  . cefTRIAXone (ROCEPHIN)  IV 2 g (02/02/20 1430)    LOS: 2 days   Kerney Elbe, DO [facility] PAGER is on [place]  If 7PM-7AM, please contact night-coverage [email or web address]

## 2020-02-02 NOTE — Progress Notes (Addendum)
Subjective April [name] is alert and active, sitting up in bed in NAD. She had no overnight events. She states her SOB is improving but not to baseline. Lower extremity edema is improved. She has a mild productive cough. She denies CP, palpitations, dizziness, NVDC.  She feels a little better- still some SOB.  Had low prob v/q  Objective Physical Exam  Constitutional: She is oriented to person, place, and time and well-developed, well-nourished, and in no distress.  Nasal cannula in place  Cardiovascular: Normal rate, regular rhythm, S1 normal, S2 normal and normal heart sounds. Exam reveals no gallop and no friction rub.  No murmur heard. Pulmonary/Chest: Effort normal and breath sounds normal. No respiratory distress. She has no wheezes. She has no rales.  Abdominal: Soft. Bowel sounds are normal. She exhibits no distension. There is no abdominal tenderness.  Musculoskeletal:     Right lower leg: Edema present.     Left lower leg: Edema (+1) present.  Neurological: She is alert and oriented to person, place, and time.  Skin: Skin is warm and dry.   Blood pressure 100/64, pulse 76, temperature (!) 97.5 F (36.4 C), temperature source Oral, resp. rate 17, height 5\' 2"  (1.575 m), weight 78 kg, SpO2 100 %.  Intake/Output Summary (Last 24 hours) at 02/02/2020 1008 Last data filed at 02/02/2020 0500 Gross per 24 hour  Intake 380.54 ml  Output 200 ml  Net 180.54 ml   Results for orders placed or performed during the hospital encounter of 01/31/20 (from the past 24 hour(s))  CBC with Differential/Platelet     Status: Abnormal   Collection Time: 02/02/20  3:47 AM  Result Value Ref Range   WBC 8.1 4.0 - 10.5 K/uL   RBC 2.68 (L) 3.87 - 5.11 MIL/uL   Hemoglobin 7.4 (L) 12.0 - 15.0 g/dL   HCT 26.1 (L) 36.0 - 46.0 %   MCV 97.4 80.0 - 100.0 fL   MCH 27.6 26.0 - 34.0 pg   MCHC 28.4 (L) 30.0 - 36.0 g/dL   RDW 20.1 (H) 11.5 - 15.5 %   Platelets 230 150 - 400 K/uL   nRBC 0.0 0.0 - 0.2 %    Neutrophils Relative % 89 %   Neutro Abs 7.2 1.7 - 7.7 K/uL   Lymphocytes Relative 6 %   Lymphs Abs 0.5 (L) 0.7 - 4.0 K/uL   Monocytes Relative 4 %   Monocytes Absolute 0.3 0.1 - 1.0 K/uL   Eosinophils Relative 0 %   Eosinophils Absolute 0.0 0.0 - 0.5 K/uL   Basophils Relative 0 %   Basophils Absolute 0.0 0.0 - 0.1 K/uL   Immature Granulocytes 1 %   Abs Immature Granulocytes 0.07 0.00 - 0.07 K/uL  Comprehensive metabolic panel     Status: Abnormal   Collection Time: 02/02/20  3:47 AM  Result Value Ref Range   Sodium 141 135 - 145 mmol/L   Potassium 5.3 (H) 3.5 - 5.1 mmol/L   Chloride 111 98 - 111 mmol/L   CO2 21 (L) 22 - 32 mmol/L   Glucose, Bld 134 (H) 70 - 99 mg/dL   BUN 25 (H) 8 - 23 mg/dL   Creatinine, Ser 0.91 0.44 - 1.00 mg/dL   Calcium 9.3 8.9 - 10.3 mg/dL   Total Protein 6.1 (L) 6.5 - 8.1 g/dL   Albumin 2.2 (L) 3.5 - 5.0 g/dL   AST 12 (L) 15 - 41 U/L   ALT 7 0 - 44 U/L   Alkaline Phosphatase  111 38 - 126 U/L   Total Bilirubin 0.6 0.3 - 1.2 mg/dL   GFR calc non Af Amer >60 >60 mL/min   GFR calc Af Amer >60 >60 mL/min   Anion gap 9 5 - 15  Phosphorus     Status: None   Collection Time: 02/02/20  3:47 AM  Result Value Ref Range   Phosphorus 4.1 2.5 - 4.6 mg/dL  Magnesium     Status: None   Collection Time: 02/02/20  3:47 AM  Result Value Ref Range   Magnesium 2.2 1.7 - 2.4 mg/dL   Assessment & Plan  #Sepsisin a transplant recipient, source unknown #Acute respiratory failure with hypoxia Leukocytosis with a neutrophil predominance noted, possible sources include lung, skin, urinary, implanted material (fistula). She is at high risk for a bacterial infection given her immunosuppresion, and based on chart review, she has had recurrent mucopurulent bronchitis in the past  - empiric Cefepime, metronidazole, vancomycin started -BC and UC pending - Oxygen to keep O2 sats > 90% - Continue asthma treatment, hold off on steroids given acute infection ID involved-  Wanted  to rule out PE- had low prob v/q   # PCKD s/p transplant (2013) creatinine baseline 1.02-1.29 at discharge from rehab 01/05/20>>In ED creatinine 0.92>>0.89 Fortunately her allograft function (followed by Dr. Jimmy [name]) is stable- she is feeling clinically better.  -IVF d/c'ed due to edematous presentation of lower extremities  -monitor daily BMP -continue home immunosuppression management-prograf 1mg  qam & 0.5mg  qhs, myfortic 180mg  BID, prednisone 7.5mg  daily reinitiated by nephrology team as infectious illness does not seem severe and without need to modify IS regimen   #Hypertension/volume No home HTN management from chart review. Chronic LE edema L>R CXR with evidence of bilateral mild pulmonary edema and trace pleural effusions -consider low dose lasix for mild pulmonary edema and SOB -repeat CXR today to assess edema and effusion status  #Anemia Baseline anemia-hgb range 8-10 -Hgb 7.4(3/12)<<7.6 (3/11) -monitor daily CBC -maintain PTW>6.5  #Metabolic bone disease Corrected calcium 10.7/phos 4.1 (3/12)  Patient seen and examined, agree with above note with above modifications.  Pt feels slightly better.  Allograft function is fine-  Is on home immunosuppression.  BP is soft on no BP meds- stopped IVF as has edema.  Has baseline anemia- but slightly worse, could that be contributing.  I cannot blame that on her renal function because it is quite good.    Regarding K -  She seems to understand it always runs around 5.  If gets over 5.5 I would start lokelma 10 mg daily -  Prograf can cause  Renal is going to sign off due to high consult volume.  Please call if questions   April Parish, MD 02/02/2020

## 2020-02-02 NOTE — Progress Notes (Signed)
Left lower extremity venous duplex completed. Refer to "CV Proc" under chart review to view preliminary results.  02/02/2020 11:12 AM Kelby Aline., MHA, RVT, RDCS, RDMS

## 2020-02-03 ENCOUNTER — Inpatient Hospital Stay (HOSPITAL_COMMUNITY): Payer: Medicare Other

## 2020-02-03 DIAGNOSIS — I5031 Acute diastolic (congestive) heart failure: Secondary | ICD-10-CM

## 2020-02-03 LAB — CBC WITH DIFFERENTIAL/PLATELET
Abs Immature Granulocytes: 0.06 10*3/uL (ref 0.00–0.07)
Basophils Absolute: 0 10*3/uL (ref 0.0–0.1)
Basophils Relative: 0 %
Eosinophils Absolute: 0 10*3/uL (ref 0.0–0.5)
Eosinophils Relative: 0 %
HCT: 25.6 % — ABNORMAL LOW (ref 36.0–46.0)
Hemoglobin: 7.4 g/dL — ABNORMAL LOW (ref 12.0–15.0)
Immature Granulocytes: 1 %
Lymphocytes Relative: 8 %
Lymphs Abs: 0.6 10*3/uL — ABNORMAL LOW (ref 0.7–4.0)
MCH: 28.5 pg (ref 26.0–34.0)
MCHC: 28.9 g/dL — ABNORMAL LOW (ref 30.0–36.0)
MCV: 98.5 fL (ref 80.0–100.0)
Monocytes Absolute: 0.4 10*3/uL (ref 0.1–1.0)
Monocytes Relative: 5 %
Neutro Abs: 6.8 10*3/uL (ref 1.7–7.7)
Neutrophils Relative %: 86 %
Platelets: 203 10*3/uL (ref 150–400)
RBC: 2.6 MIL/uL — ABNORMAL LOW (ref 3.87–5.11)
RDW: 20 % — ABNORMAL HIGH (ref 11.5–15.5)
WBC: 7.9 10*3/uL (ref 4.0–10.5)
nRBC: 0 % (ref 0.0–0.2)

## 2020-02-03 LAB — RETICULOCYTES
Immature Retic Fract: 29.7 % — ABNORMAL HIGH (ref 2.3–15.9)
RBC.: 2.65 MIL/uL — ABNORMAL LOW (ref 3.87–5.11)
Retic Count, Absolute: 57.5 10*3/uL (ref 19.0–186.0)
Retic Ct Pct: 2.2 % (ref 0.4–3.1)

## 2020-02-03 LAB — COMPREHENSIVE METABOLIC PANEL
ALT: 7 U/L (ref 0–44)
AST: 17 U/L (ref 15–41)
Albumin: 2.2 g/dL — ABNORMAL LOW (ref 3.5–5.0)
Alkaline Phosphatase: 105 U/L (ref 38–126)
Anion gap: 10 (ref 5–15)
BUN: 26 mg/dL — ABNORMAL HIGH (ref 8–23)
CO2: 20 mmol/L — ABNORMAL LOW (ref 22–32)
Calcium: 9.4 mg/dL (ref 8.9–10.3)
Chloride: 110 mmol/L (ref 98–111)
Creatinine, Ser: 0.93 mg/dL (ref 0.44–1.00)
GFR calc Af Amer: >60 mL/min (ref >60)
GFR calc non Af Amer: >60 mL/min (ref >60)
Glucose, Bld: 109 mg/dL — ABNORMAL HIGH (ref 70–99)
Potassium: 4.6 mmol/L (ref 3.5–5.1)
Sodium: 140 mmol/L (ref 135–145)
Total Bilirubin: 1 mg/dL (ref 0.3–1.2)
Total Protein: 6.1 g/dL — ABNORMAL LOW (ref 6.5–8.1)

## 2020-02-03 LAB — VITAMIN B12: Vitamin B-12: 795 pg/mL (ref 180–914)

## 2020-02-03 LAB — MAGNESIUM: Magnesium: 2.1 mg/dL (ref 1.7–2.4)

## 2020-02-03 LAB — IRON AND TIBC
Iron: 88 ug/dL (ref 28–170)
Saturation Ratios: 45 % — ABNORMAL HIGH (ref 10.4–31.8)
TIBC: 197 ug/dL — ABNORMAL LOW (ref 250–450)
UIBC: 109 ug/dL

## 2020-02-03 LAB — PHOSPHORUS: Phosphorus: 3.1 mg/dL (ref 2.5–4.6)

## 2020-02-03 LAB — FOLATE: Folate: 6.4 ng/mL (ref >5.9)

## 2020-02-03 LAB — FERRITIN: Ferritin: 155 ng/mL (ref 11–307)

## 2020-02-03 MED ORDER — SALINE SPRAY 0.65 % NA SOLN
1.0000 | NASAL | Status: DC | PRN
  Administered 2020-02-04: 01:00:00 1 via NASAL
  Filled 2020-02-03: qty 44

## 2020-02-03 MED ORDER — FUROSEMIDE 40 MG PO TABS
40.0000 mg | ORAL_TABLET | Freq: Once | ORAL | Status: AC
  Administered 2020-02-03: 40 mg via ORAL
  Filled 2020-02-03: qty 1

## 2020-02-03 NOTE — Progress Notes (Signed)
PROGRESS NOTE    April [name]  HQI:[id number] DOB: 05-18-1956 DOA: 01/31/2020 PCP: [name], MD   Brief Narrative:  HPI per Dr. Gilles Chiquito on 01/31/20 April [name] is a 64 y.o. 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  She reports that her symptoms started this morning.  She became more and more short of breath.  She has this issue sometimes and takes her home asthma medications, however, she was not able to get the symptoms under control.  Usually when she cannot get it under control, she will come to the hospital to take steroids.  She was noted by EMS to have saturation to 74% on RA.  She improved with medications to 90% on RA and then to 96% with 2LNC.  She further had an elevated HR with palpitations, fever to 103F which improved with tylenol and tachypnea.  She notes 1 day of productive cough, chills, sneezing, sinus congestion, nausea.  She has had no sick contacts and no contacts with coronavirus.  She is remaining at home and waiting to get her vaccine.  She denied any urinary symptoms, change in urination, chest pain, pain with deep breathing.  She does have chronic skin changes, a small area of soreness on her left buttock and some chronic LE swelling, worse on the left.  She notes that she had a hip replacement in January and also had pelvic fractures which have limited her mobility recently.  She has no headache or vision changes.    ED Course: In the ED, she was found to be Febrile with an elevated HR and RR with hypoxia.  She was noted to have a normal WBC.  CXR showed only pulmonary edema, but no obvious pneumonia.  She may have had a bronchitis.  Blood cultures and urine culture were obtained.  She was started on antibiotics for an unknown source in an immunocompromised person.  Initial COVID Ag testing is negative  **Interim History She states that  she is feeling much better nephrology was consulted for further evaluation recommendations.  Nephrology recommends continuing her current Immunosuppressive medications and they have stopped IV fluids.  Broad-spectrum antibiotics were changed to IV ceftriaxone after discussion with Dr. Graylon Good of infectious diseases.  Dr. Graylon Good was concerned for possible PE however her VQ scan shows very low probability for pulmonary embolus.  Her lower extremity duplex negative for DVT in the left leg.  Was given a dose of IV Lasix yesterday and will be given a dose of p.o. Lasix as she states that she is feeling better.  Will obtain PT OT evaluation prior to discharge for safe discharge disposition.  Nephrology has now signed off the case.  Assessment & Plan:   Active Problems:   Bronchitis, mucopurulent recurrent (HCC)   Asthma, mild intermittent   GERD (gastroesophageal reflux disease)   Hyperlipidemia   Chronic diastolic CHF (congestive heart failure) (HCC)   Acute respiratory failure with hypoxia (HCC)   Sepsis ([place])  Sepsis in a transplant recipient, source unknown likely it was from her mucopurulent bronchitis Acute respiratory failure with hypoxia  in the setting of above versus an acute on chronic diastolic CHF exacerbation or anemia -She currently does not have a leukocytosis anymore and her WBC is now 7.9 - Sources include lung, skin, urinary, implanted material (fistula) unclear what her etiology is -Though this could certainly be COVID related this is  highly unlikely, she does not have a CXR which is consistent with viral pneumonia, she does not have lymphopenia and no contacts.  She is at high risk for a bacterial infection given her immunosuppresion, and based on chart review, she has had recurrent mucopurulent bronchitis in the past -Initial CXR did show "Shallow inspiration with stable cardiomegaly, mild pulmonary edema and trace bilateral pleural effusion. Streaky infrahilar consolidation,  probably a nonspecific bronchitis. No pneumonia." -ABG showed pH of 7.45, PCO2 29.6, PO2 of 187, bicarbonate level of 20.1 and an ABG O2 saturation of 100% -Check PCT 0.41 and slightly worsened to 0.71 -Trend Lactic acid and went from 1.1 is now 0.8 - Cefepime, metronidazole, vancomycin started given unknown source but will change to IV ceftriaxone given infectious disease recommendations and will continue for now -Blood cultures and urine culture obtained however source is unlikely to be the urine as her urinalysis was clear which showed negative nitrites, negative leukocytes and no bacteria seen - Progressive unit, telemetry - IVF with NS at 100cc/hr now stopped by nephrology -Patient's cortisol level is 8.8 -SpO2: 99 % O2 Flow Rate (L/min): 2 L/min - Oxygen to keep O2 sats > 90% - Continue asthma treatment, hold off on steroids given acute infection -Continue with acetaminophen for antipyretics -Spoke with Dr. Baxter Flattery of infectious diseases who is less concerned about infection and more concerned about possible PE -VQ scan was obtained given patient's renal status and was very low probability for PE.  Her D-dimer is slightly elevated at 1.46 but her lower extremity duplex from the left leg was negative for any VTE -Repeat chest x-ray today showed "Stable cardiomediastinal silhouette. No pneumothorax or pleural effusion is noted. Left lung is clear. Minimal right basilar subsegmental atelectasis is noted. The visualized skeletal structures are unremarkable." -Continue to monitor she has been afebrile now and has no leukocytosis however she still remains on 2 L supplemental oxygen -We will try some diuresis and was given IV Lasix 40 mg once yesterday given her elevated BNP.  Patient states that she is feeling better after the Lasix administration and will try p.o. today given her soft blood pressure - Initial COVID testing negative in ED, follow up PCR testing sent and this was negative as  well -Her sepsis physiology is improved significantly as she is now afebrile and is not tachycardic or tachypneic and blood pressure remained stable -We will get PT OT to evaluate and treat  Supraventricular Tachycardia status post conversion to normal sinus rhythm -Heart rates were into the 190s yesterday and she was given adenosine with conversion -Continue monitor telemetry in the progressive unit  PCKD s/p transplant - Continue immunosuppressive medications nephrology was consulted and they recommending continuing them - Follow up cultures; she is at risk for fungal infections and concomitant viral infections like JC virus and BK virus.  It is reassuring that her renal function is stable.  -Her BUN /creatinine has gone from 18/0.78 -> 25/0.91 -> 26/0.93 after an IV dose of Lasix -Continue monitor and trend and avoid nephrotoxic medications, contrast dyes, hypotension and renally adjust medications; wanted to order a CTA of the chest to rule out PE however substituted this with the VQ scan and VQ scan showed low probability for pulmonary embolus with no perfusion defects -Repeat CMP in a.m and watch creatinine trend    Leg Edema -Per Patient is Chronic but will r/o DVT -Check LE Venous Duplex and left side did not show any blood clot  Asthma, mild intermittent -  Continue ICS/LABA - Continue prn albuterol  -Repeat chest x-ray this AM showed "Stable cardiomediastinal silhouette. No pneumothorax or pleural effusion is noted. Left lung is clear. Minimal right basilar subsegmental atelectasis is noted. The visualized skeletal structures are unremarkable." -We will order a peak flow meter continue with incentive spirometry and flutter valve -Continue guaifenesin    GERD (gastroesophageal reflux disease) - Continue PPI    Acute on chronic Diastolic CHF (congestive heart failure) Hypoalbuminemia -Last TTE in May showed indeterminate diastolic parameters -She has some mild swelling and  crackles in the bases and still is short of breath and requires 2 L supplemental oxygen -We will try a dose of IV Lasix 40 mg x 1 yesterday and will give a dose of p.o. 40 Lasix today -Continue to monitor volume status carefully and IV fluids have now been stopped by nephrology -Strict I's and O's and daily weights -She is - 2.337 L since admission -BNP was elevated on admission was 574.8 -Continue to monitor for signs and symptoms of volume overload  Bronchitis, mucopurulent recurrent  - Per chart review from 2015 by Dr. Gwenette Greet: "The patient has no evidence for bronchiectasis on her high-resolution CT, and there is no evidence for hypogammaglobulinemia. One possibility for her recurrent pulmonary symptoms/infection is that it may be related to her aggressive immunosuppressive regimen. I have asked her to discuss this possibility with her transplant team. Her pulmonary function studies are not overly impressive, but there is the suggestion of subtle airflow obstruction by her flow volume loop. Her history is suggestive of recurrent asthmatic bronchitis, and therefore I would like to try her on a LABA/ICS to see if that makes a difference. I will use an inhaler with a low inhaled steroid dose since she is on low-dose prednisone for her transplant. I would also like to do a scan of her sinuses to make sure that is not the source of her recurrent infections/symptoms. If this is unremarkable, I can really find no source for her recurrent symptoms, and we need to strongly consider the role that her immunosuppression may be playing." - Curious if this may be another recurrence related to her transplant medications as CXR showed "Shallow inspiration with stable cardiomegaly, mild pulmonary edema and trace bilateral pleural effusion.  Streaky infrahilar consolidation, probably a nonspecific bronchitis. No pneumonia." -See above -Continue with guaifenesin, flutter valve and incentive spirometry -Continue with  IV ceftriaxone as above  Mild Hyponatremia -Was 132 on admission and is now 140 -Continue to monitor and trend and repeat CMP in a.m.  Metabolic Acidosis -Mild.  CO2 is now 20, chloride is 110, anion gap is 10 -IV fluid hydration is now stopped  -Continue to monitor and trend and repeat CMP in a.m.  Hyperkalemia -Mild and patient states that her potassium runs around 5 usually  -Patient's potassium yesterday was 5.3 and today is now 4.6 -She was given IV Lasix 40 mg x 1 yesterday and will be given p.o. 40 Lasix this morning  -Nephrology recommending to start Lokelma 10 mg p.o. daily if potassium gets over 5.5  Normocytic Anemia -Patient's hemoglobin/hematocrit was 7.6/27.7 and dropped down to 7.4/26.1 and today is 7.4/25.6 -Checked anemia panel showed an iron level of 88, U IBC of 109, TIBC of 197, saturation ratios of 45%, ferritin level 155, folate level 6.4, vitamin B12 795 -Continue to monitor for signs and symptoms of bleeding; currently no overt bleeding note -Repeat CBC in a.m.  Recent Hip Surgery -Patient complaining of Left Hip Pain -  Obtain X-Ray showed "Changes consistent with prior left hip replacement. Healing pubic rami fractures on the left are noted. Increased fragmentation of the greater trochanter fragment seen on the prior exam." -We will resume her gabapentin -We will get PT and OT to evaluate and treat  Glaucoma -Continue with home eyedrops of dorzolamide, latanoprost and brimonidine  DVT prophylaxis: Enoxaparin 40 mg sq q24h Code Status: FULL CODE  Family Communication: No Family present at bedside  Disposition Plan: Patient is from home and likely can be discharged when she has further clinical improvement.  Her oxygenation is improving and will wean her oxygen prior to discharge however we will also obtain a PT OT to evaluate and treat given her generalized weakness  Consultants:   Nephrology    Procedures: None   Antimicrobials:  Anti-infectives  (From admission, onward)   Start     Dose/Rate Route Frequency Ordered Stop   02/02/20 1000  cefTRIAXone (ROCEPHIN) 2 g in sodium chloride 0.9 % 100 mL IVPB     2 g 200 mL/hr over 30 Minutes Intravenous Every 24 hours 02/02/20 0918     02/01/20 2200  vancomycin (VANCOCIN) IVPB 1000 mg/200 mL premix  Status:  Discontinued     1,000 mg 200 mL/hr over 60 Minutes Intravenous Every 24 hours 01/31/20 2155 02/02/20 0918   01/31/20 2200  ceFEPIme (MAXIPIME) 2 g in sodium chloride 0.9 % 100 mL IVPB  Status:  Discontinued     2 g 200 mL/hr over 30 Minutes Intravenous Every 8 hours 01/31/20 2155 02/02/20 0918   01/31/20 2200  vancomycin (VANCOREADY) IVPB 1750 mg/350 mL     1,750 mg 175 mL/hr over 120 Minutes Intravenous  Once 01/31/20 2155 02/01/20 0238   01/31/20 2145  metroNIDAZOLE (FLAGYL) IVPB 500 mg     500 mg 100 mL/hr over 60 Minutes Intravenous  Once 01/31/20 2141 01/31/20 2308     Subjective: Seen and examined at bedside and she states that she is feeling much better today and not short of breath.  Had a bowel movement.  No nausea or vomiting.  I told her that we will attempt to wean her oxygen and she is in agreement.  We will obtain PT OT given her generalized weakness.  No nausea or vomiting.  No other concerns or complaints at this time.  Objective: Vitals:   02/02/20 2108 02/02/20 2114 02/03/20 0357 02/03/20 0746  BP: (!) 124/53  (!) 103/56   Pulse: 85  71   Resp: 19  15   Temp: 98.3 F (36.8 C)  97.7 F (36.5 C)   TempSrc: Oral  Oral   SpO2: 100% 100% 100% 99%  Weight:   77.6 kg   Height:        Intake/Output Summary (Last 24 hours) at 02/03/2020 1529 Last data filed at 02/03/2020 0700 Gross per 24 hour  Intake --  Output 2200 ml  Net -2200 ml   Filed Weights   01/31/20 1948 02/02/20 0511 02/03/20 0357  Weight: 80 kg 78 kg 77.6 kg   Examination: Physical Exam:  Constitutional: WN/WD obese Caucasian female currently in no acute distress appears calm but she looks a  little uncomfortable sitting on the bedpan. Eyes: Lids and conjunctivae normal, sclerae anicteric  ENMT: External Ears, Nose appear normal. Grossly normal hearing. Mucous membranes are moist.  Neck: Appears normal, supple, no cervical masses, normal ROM, no appreciable thyromegaly; no JVD Respiratory: Diminished to auscultation bilaterally with coarse breath sounds and some mild crackles but  she has no appreciable wheezing, rales, rhonchi. Normal respiratory effort and patient is not tachypenic. No accessory muscle use.  Wearing supplemental oxygen via nasal cannula Cardiovascular: RRR, no murmurs / rubs / gallops. S1 and S2 auscultated.  Has 1+ lower extremity edema more so on the left compared to right Abdomen: Soft, non-tender, distended secondary to body habitus.  Bowel sounds positive.  GU: Deferred. Musculoskeletal: No clubbing / cyanosis of digits/nails. No joint deformity upper and lower extremities.  Skin: No rashes, lesions, ulcers on a limited skin evaluation. No induration; Warm and dry.  Neurologic: CN 2-12 grossly intact with no focal deficits.  Romberg sign and cerebellar reflexes not assessed.  Psychiatric: Normal judgment and insight. Alert and oriented x 3. Normal mood and appropriate affect.   Data Reviewed: I have personally reviewed following labs and imaging studies  CBC: Recent Labs  Lab 01/31/20 2007 01/31/20 2229 02/01/20 0628 02/02/20 0347 02/03/20 0205  WBC 9.2  --  9.8 8.1 7.9  NEUTROABS 8.1*  --   --  7.2 6.8  HGB 8.7* 7.5* 7.6* 7.4* 7.4*  HCT 30.3* 22.0* 27.7* 26.1* 25.6*  MCV 98.1  --  98.6 97.4 98.5  PLT 293  --  247 230 790   Basic Metabolic Panel: Recent Labs  Lab 01/31/20 2007 01/31/20 2229 02/01/20 0628 02/01/20 0848 02/02/20 0347 02/03/20 0205  NA 133* 132*  --  136 141 140  K 4.4 4.7  --  5.1 5.3* 4.6  CL 102  --   --  107 111 110  CO2 22  --   --  21* 21* 20*  GLUCOSE 136*  --   --  173* 134* 109*  BUN 13  --   --  18 25* 26*   CREATININE 0.92  --  0.89 0.78 0.91 0.93  CALCIUM 9.2  --   --  8.8* 9.3 9.4  MG  --   --   --  2.1 2.2 2.1  PHOS  --   --   --  4.5 4.1 3.1   GFR: Estimated Creatinine Clearance: 60.5 mL/min (by C-G formula based on SCr of 0.93 mg/dL). Liver Function Tests: Recent Labs  Lab 01/31/20 2007 02/01/20 0848 02/02/20 0347 02/03/20 0205  AST 14* 13* 12* 17  ALT 8 8 7 7   ALKPHOS 143* 130* 111 105  BILITOT 1.2 1.1 0.6 1.0  PROT 6.9 6.5 6.1* 6.1*  ALBUMIN 2.5* 2.3* 2.2* 2.2*   No results for input(s): LIPASE, AMYLASE in the last 168 hours. No results for input(s): AMMONIA in the last 168 hours. Coagulation Profile: Recent Labs  Lab 01/31/20 2007 02/01/20 2409  INR 1.2 1.4*   Cardiac Enzymes: No results for input(s): CKTOTAL, CKMB, CKMBINDEX, TROPONINI in the last 168 hours. BNP (last 3 results) No results for input(s): PROBNP in the last 8760 hours. HbA1C: No results for input(s): HGBA1C in the last 72 hours. CBG: No results for input(s): GLUCAP in the last 168 hours. Lipid Profile: No results for input(s): CHOL, HDL, LDLCALC, TRIG, CHOLHDL, LDLDIRECT in the last 72 hours. Thyroid Function Tests: No results for input(s): TSH, T4TOTAL, FREET4, T3FREE, THYROIDAB in the last 72 hours. Anemia Panel: Recent Labs    02/03/20 0205  VITAMINB12 795  FOLATE 6.4  FERRITIN 155  TIBC 197*  IRON 88  RETICCTPCT 2.2   Sepsis Labs: Recent Labs  Lab 01/31/20 2007 02/01/20 0628 02/01/20 0835  PROCALCITON 0.41 0.71  --   LATICACIDVEN 1.1  --  0.8  Recent Results (from the past 240 hour(s))  SARS CORONAVIRUS 2 (TAT 6-24 HRS) Nasopharyngeal Nasopharyngeal Swab     Status: None   Collection Time: 01/31/20 12:54 AM   Specimen: Nasopharyngeal Swab  Result Value Ref Range Status   SARS Coronavirus 2 NEGATIVE NEGATIVE Final    Comment: (NOTE) SARS-CoV-2 target nucleic acids are NOT DETECTED. The SARS-CoV-2 RNA is generally detectable in upper and lower respiratory specimens  during the acute phase of infection. Negative results do not preclude SARS-CoV-2 infection, do not rule out co-infections with other pathogens, and should not be used as the sole basis for treatment or other patient management decisions. Negative results must be combined with clinical observations, patient history, and epidemiological information. The expected result is Negative. Fact Sheet for Patients: SugarRoll.be Fact Sheet for Healthcare Providers: https://www.woods-mathews.com/ This test is not yet approved or cleared by the [place] FDA and  has been authorized for detection and/or diagnosis of SARS-CoV-2 by FDA under an Emergency Use Authorization (EUA). This EUA will remain  in effect (meaning this test can be used) for the duration of the COVID-19 declaration under Section 56 4(b)(1) of the Act, 21 U.S.C. section 360bbb-3(b)(1), unless the authorization is terminated or revoked sooner. Performed at [facility], [place] [address], [place], [place] [address]   Blood Culture (routine x 2)     Status: None (Preliminary result)   Collection Time: 01/31/20  8:15 PM   Specimen: BLOOD RIGHT HAND  Result Value Ref Range Status   Specimen Description BLOOD RIGHT HAND  Final   Special Requests   Final    BOTTLES DRAWN AEROBIC AND ANAEROBIC Blood Culture adequate volume   Culture   Final    NO GROWTH 3 DAYS Performed at [facility], [place] [address], [place], [place] [address]    Report Status PENDING  Incomplete  Urine culture     Status: Abnormal   Collection Time: 01/31/20  8:15 PM   Specimen: In/Out Cath Urine  Result Value Ref Range Status   Specimen Description IN/OUT CATH URINE  Final   Special Requests   Final    NONE Performed at [facility], [place] [address], [place], [place] [address]    Culture MULTIPLE SPECIES PRESENT, SUGGEST RECOLLECTION (A)  Final   Report Status 02/01/2020 FINAL  Final  Blood Culture  (routine x 2)     Status: None (Preliminary result)   Collection Time: 02/01/20  8:58 AM   Specimen: BLOOD RIGHT ARM  Result Value Ref Range Status   Specimen Description BLOOD RIGHT ARM  Final   Special Requests   Final    AEROBIC BOTTLE ONLY Blood Culture results may not be optimal due to an inadequate volume of blood received in culture bottles   Culture   Final    NO GROWTH 2 DAYS Performed at [facility], [place] [address] [place], [place] [address]    Report Status PENDING  Incomplete     RN Pressure Injury Documentation: Pressure Injury 02/01/20 Buttocks Left (Active)  02/01/20 1457  Location: Buttocks  Location Orientation: Left  Staging:   Wound Description (Comments):   Present on Admission: Yes     Estimated body mass index is 31.29 kg/m as calculated from the following:   Height as of this encounter: 5\' 2"  (1.575 m).   Weight as of this encounter: 77.6 kg.  Malnutrition Type:      Malnutrition Characteristics:  Nutrition Interventions:     Radiology Studies: DG Chest 2 View  Result Date: 02/02/2020 CLINICAL DATA:  Dyspnea. EXAM: CHEST - 2 VIEW COMPARISON:  January 31, 2020. FINDINGS: Stable cardiomediastinal silhouette. No pneumothorax or pleural effusion is noted. Left lung is clear. Minimal right basilar subsegmental atelectasis is noted. The visualized skeletal structures are unremarkable. IMPRESSION: Minimal right basilar subsegmental atelectasis. Electronically Signed   By: Marijo Conception M.D.   On: 02/02/2020 14:25   NM Pulmonary Perfusion  Result Date: 02/02/2020 CLINICAL DATA:  Shortness of breath EXAM: NUCLEAR MEDICINE PERFUSION LUNG SCAN TECHNIQUE: Perfusion images were obtained in multiple projections after intravenous injection of radiopharmaceutical. Ventilation scans intentionally deferred if perfusion scan and chest x-ray adequate for interpretation during COVID 19 epidemic. Views: Anterior, posterior, left lateral, right lateral,  RPO, LPO, RAO, LAO RADIOPHARMACEUTICALS:  1.55 mCi Tc-48m MAA IV COMPARISON:  Chest radiograph January 31, 2020 FINDINGS: Radiotracer uptake bilaterally is homogeneous and symmetric. No focal perfusion defects evident. IMPRESSION: No perfusion defects evident. Very low probability of pulmonary embolus. Electronically Signed   By: Lowella Grip III M.D.   On: 02/02/2020 13:34   DG CHEST PORT 1 VIEW  Result Date: 02/03/2020 CLINICAL DATA:  Shortness of breath EXAM: PORTABLE CHEST 1 VIEW COMPARISON:  02/02/2020 FINDINGS: Mild right basilar opacity, likely atelectasis. Lungs otherwise clear. No pleural effusion or pneumothorax. Mild cardiomegaly. IMPRESSION: No evidence of acute cardiopulmonary disease. Electronically Signed   By: Julian Hy M.D.   On: 02/03/2020 09:21   DG HIP UNILAT WITH PELVIS 2-3 VIEWS LEFT  Result Date: 02/01/2020 CLINICAL DATA:  Left hip pain for 1 day, no known injury, initial encounter EXAM: DG HIP (WITH OR WITHOUT PELVIS) 3V LEFT COMPARISON:  12/21/2019 FINDINGS: Left hip replacement is again seen and stable. The greater trochanter fracture fragment is again seen and slightly displaced when compare with the prior exam. Prior left pubic rami fractures are again noted. Callus formation in these fractures. No soft tissue abnormality is seen. IMPRESSION: Changes consistent with prior left hip replacement. Healing pubic rami fractures on the left are noted. Increased fragmentation of the greater trochanter fragment seen on the prior exam. Electronically Signed   By: Inez Catalina M.D.   On: 02/01/2020 21:41   VAS [place] LOWER EXTREMITY VENOUS (DVT)  Result Date: 02/02/2020  Lower Venous DVTStudy Indications: Edema.  Limitations: Poor ultrasound/tissue interface. Comparison Study: 12/16/2019-negative lower extremity venous duplex Performing Technologist: Maudry Mayhew MHA, RDMS, RVT, RDCS  Examination Guidelines: A complete evaluation includes B-mode imaging, spectral Doppler,  color Doppler, and power Doppler as needed of all accessible portions of each vessel. Bilateral testing is considered an integral part of a complete examination. Limited examinations for reoccurring indications may be performed as noted. The reflux portion of the exam is performed with the patient in reverse Trendelenburg.  +---------+---------------+---------+-----------+----------+--------------+ LEFT     CompressibilityPhasicitySpontaneityPropertiesThrombus Aging +---------+---------------+---------+-----------+----------+--------------+ CFV      Full           Yes      Yes                                 +---------+---------------+---------+-----------+----------+--------------+ SFJ      Full                                                        +---------+---------------+---------+-----------+----------+--------------+  FV Prox  Full                                                        +---------+---------------+---------+-----------+----------+--------------+ FV Mid   Full                                                        +---------+---------------+---------+-----------+----------+--------------+ FV DistalFull                                                        +---------+---------------+---------+-----------+----------+--------------+ PFV      Full                                                        +---------+---------------+---------+-----------+----------+--------------+ POP      Full           Yes      Yes                                 +---------+---------------+---------+-----------+----------+--------------+ PTV      Full                                                        +---------+---------------+---------+-----------+----------+--------------+ PERO     Full                                                        +---------+---------------+---------+-----------+----------+--------------+     Summary: LEFT: - There  is no evidence of deep vein thrombosis in the lower extremity.  - No cystic structure found in the popliteal fossa.  *See table(s) above for measurements and observations.    Preliminary    Scheduled Meds: . allopurinol  300 mg Oral Daily  . aspirin  325 mg Oral Q breakfast  . brimonidine  1 drop Left Eye BID  . docusate sodium  100 mg Oral BID  . dorzolamide  1 drop Both Eyes Daily  . enoxaparin (LOVENOX) injection  40 mg Subcutaneous Daily  . FLUoxetine  40 mg Oral Daily  . furosemide  40 mg Oral Once  . gabapentin  200 mg Oral TID  . guaiFENesin  1,200 mg Oral BID  . latanoprost  1 drop Both Eyes QHS  . magnesium oxide  400 mg Oral BID  . mouth rinse  15 mL Mouth Rinse BID  . mometasone-formoterol  2 puff Inhalation BID  . mycophenolate  180 mg Oral BID  .  pantoprazole  40 mg Oral BID  . predniSONE  7.5 mg Oral Q breakfast  . saccharomyces boulardii  250 mg Oral BID  . sodium chloride flush  3 mL Intravenous Q12H  . tacrolimus  0.5 mg Oral QHS  . tacrolimus  1 mg Oral Daily   Continuous Infusions: . sodium chloride 500 mL (02/02/20 0603)  . cefTRIAXone (ROCEPHIN)  IV 2 g (02/03/20 1011)    LOS: 3 days   Kerney Elbe, DO [facility] PAGER is on [place]  If 7PM-7AM, please contact night-coverage [email or web address]

## 2020-02-04 ENCOUNTER — Inpatient Hospital Stay (HOSPITAL_COMMUNITY): Payer: Medicare Other

## 2020-02-04 LAB — CBC WITH DIFFERENTIAL/PLATELET
Abs Immature Granulocytes: 0.05 10*3/uL (ref 0.00–0.07)
Basophils Absolute: 0 10*3/uL (ref 0.0–0.1)
Basophils Relative: 0 %
Eosinophils Absolute: 0 10*3/uL (ref 0.0–0.5)
Eosinophils Relative: 0 %
HCT: 26.6 % — ABNORMAL LOW (ref 36.0–46.0)
Hemoglobin: 7.4 g/dL — ABNORMAL LOW (ref 12.0–15.0)
Immature Granulocytes: 1 %
Lymphocytes Relative: 12 %
Lymphs Abs: 0.7 10*3/uL (ref 0.7–4.0)
MCH: 27.5 pg (ref 26.0–34.0)
MCHC: 27.8 g/dL — ABNORMAL LOW (ref 30.0–36.0)
MCV: 98.9 fL (ref 80.0–100.0)
Monocytes Absolute: 0.3 10*3/uL (ref 0.1–1.0)
Monocytes Relative: 6 %
Neutro Abs: 4.3 10*3/uL (ref 1.7–7.7)
Neutrophils Relative %: 81 %
Platelets: 210 10*3/uL (ref 150–400)
RBC: 2.69 MIL/uL — ABNORMAL LOW (ref 3.87–5.11)
RDW: 20.1 % — ABNORMAL HIGH (ref 11.5–15.5)
WBC: 5.4 10*3/uL (ref 4.0–10.5)
nRBC: 0 % (ref 0.0–0.2)

## 2020-02-04 LAB — COMPREHENSIVE METABOLIC PANEL
ALT: 8 U/L (ref 0–44)
AST: 12 U/L — ABNORMAL LOW (ref 15–41)
Albumin: 2.2 g/dL — ABNORMAL LOW (ref 3.5–5.0)
Alkaline Phosphatase: 97 U/L (ref 38–126)
Anion gap: 9 (ref 5–15)
BUN: 25 mg/dL — ABNORMAL HIGH (ref 8–23)
CO2: 25 mmol/L (ref 22–32)
Calcium: 9.4 mg/dL (ref 8.9–10.3)
Chloride: 107 mmol/L (ref 98–111)
Creatinine, Ser: 0.86 mg/dL (ref 0.44–1.00)
GFR calc Af Amer: >60 mL/min (ref >60)
GFR calc non Af Amer: >60 mL/min (ref >60)
Glucose, Bld: 112 mg/dL — ABNORMAL HIGH (ref 70–99)
Potassium: 4.1 mmol/L (ref 3.5–5.1)
Sodium: 141 mmol/L (ref 135–145)
Total Bilirubin: 0.9 mg/dL (ref 0.3–1.2)
Total Protein: 6.1 g/dL — ABNORMAL LOW (ref 6.5–8.1)

## 2020-02-04 LAB — MAGNESIUM: Magnesium: 1.8 mg/dL (ref 1.7–2.4)

## 2020-02-04 LAB — PHOSPHORUS: Phosphorus: 2.4 mg/dL — ABNORMAL LOW (ref 2.5–4.6)

## 2020-02-04 MED ORDER — K PHOS MONO-SOD PHOS DI & MONO 155-852-130 MG PO TABS
500.0000 mg | ORAL_TABLET | Freq: Once | ORAL | Status: AC
  Administered 2020-02-04: 500 mg via ORAL
  Filled 2020-02-04: qty 2

## 2020-02-04 MED ORDER — FUROSEMIDE 10 MG/ML IJ SOLN
40.0000 mg | Freq: Once | INTRAMUSCULAR | Status: AC
  Administered 2020-02-04: 40 mg via INTRAVENOUS
  Filled 2020-02-04: qty 4

## 2020-02-04 MED ORDER — MAGNESIUM SULFATE IN D5W 1-5 GM/100ML-% IV SOLN
1.0000 g | Freq: Once | INTRAVENOUS | Status: AC
  Administered 2020-02-04: 1 g via INTRAVENOUS
  Filled 2020-02-04: qty 100

## 2020-02-04 NOTE — Progress Notes (Signed)
PROGRESS NOTE    SANIYYA GAU  JJK:[id number] DOB: [birth year] DOA: 01/31/2020 PCP: [name], MD   Brief Narrative:  HPI per Dr. Gilles Chiquito on 01/31/20 FRONIE HOLSTEIN is a 64 y.o. 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  She reports that her symptoms started this morning.  She became more and more short of breath.  She has this issue sometimes and takes her home asthma medications, however, she was not able to get the symptoms under control.  Usually when she cannot get it under control, she will come to the hospital to take steroids.  She was noted by EMS to have saturation to 74% on RA.  She improved with medications to 90% on RA and then to 96% with 2LNC.  She further had an elevated HR with palpitations, fever to 103F which improved with tylenol and tachypnea.  She notes 1 day of productive cough, chills, sneezing, sinus congestion, nausea.  She has had no sick contacts and no contacts with coronavirus.  She is remaining at home and waiting to get her vaccine.  She denied any urinary symptoms, change in urination, chest pain, pain with deep breathing.  She does have chronic skin changes, a small area of soreness on her left buttock and some chronic LE swelling, worse on the left.  She notes that she had a hip replacement in January and also had pelvic fractures which have limited her mobility recently.  She has no headache or vision changes.    ED Course: In the ED, she was found to be Febrile with an elevated HR and RR with hypoxia.  She was noted to have a normal WBC.  CXR showed only pulmonary edema, but no obvious pneumonia.  She may have had a bronchitis.  Blood cultures and urine culture were obtained.  She was started on antibiotics for an unknown source in an immunocompromised person.  Initial COVID Ag testing is negative  **Interim History She states that  she is feeling much better nephrology was consulted for further evaluation recommendations.  Nephrology recommends continuing her current Immunosuppressive medications and they have stopped IV fluids.  Broad-spectrum antibiotics were changed to IV ceftriaxone after discussion with Dr. Graylon Good of infectious diseases.  Dr. Graylon Good was concerned for possible PE however her VQ scan shows very low probability for pulmonary embolus.  Her lower extremity duplex negative for DVT in the left leg.  Was given a dose of IV Lasix yesterday and will be given a dose of p.o. Lasix as she states that she is feeling better.  Will obtain PT OT evaluation prior to discharge for safe discharge disposition.  Nephrology has now signed off the case.  Patient was given another dose of IV Lasix today and will anticipate discharging home in the a.m.  Assessment & Plan:   Active Problems:   Bronchitis, mucopurulent recurrent (HCC)   Asthma, mild intermittent   GERD (gastroesophageal reflux disease)   Hyperlipidemia   Chronic diastolic CHF (congestive heart failure) (HCC)   Acute respiratory failure with hypoxia (HCC)   Sepsis ([place])  Sepsis in a transplant recipient, source unknown likely it was from her mucopurulent bronchitis Acute respiratory failure with hypoxia  in the setting of above versus an acute on chronic diastolic CHF exacerbation or anemia -She currently does not have a leukocytosis anymore and her WBC is now 7.9 - Sources include lung, skin,  urinary, implanted material (fistula) unclear what her etiology is -Though this could certainly be COVID related this is highly unlikely, she does not have a CXR which is consistent with viral pneumonia, she does not have lymphopenia and no contacts.  She is at high risk for a bacterial infection given her immunosuppresion, and based on chart review, she has had recurrent mucopurulent bronchitis in the past -Initial CXR did show "Shallow inspiration with stable cardiomegaly,  mild pulmonary edema and trace bilateral pleural effusion. Streaky infrahilar consolidation, probably a nonspecific bronchitis. No pneumonia." -ABG showed pH of 7.45, PCO2 29.6, PO2 of 187, bicarbonate level of 20.1 and an ABG O2 saturation of 100% -Check PCT 0.41 and slightly worsened to 0.71 -Trend Lactic acid and went from 1.1 is now 0.8 - Cefepime, metronidazole, vancomycin started given unknown source but will change to IV ceftriaxone given infectious disease recommendations and will continue for now -Blood cultures and urine culture obtained however source is unlikely to be the urine as her urinalysis was clear which showed negative nitrites, negative leukocytes and no bacteria seen - Progressive unit, telemetry - IVF with NS at 100cc/hr now stopped by nephrology -Patient's cortisol level is 8.8 -SpO2: 97 % O2 Flow Rate (L/min): 2 L/min FiO2 (%): 95 %; Now patient has been weaned off of supplemental oxygen via nasal cannula and is saturating well on room air -We will order an ambulatory home O2 screen prior to discharge and will give the patient another dose of IV Lasix today 40 mg x 1 - Oxygen to keep O2 sats > 90% - Continue asthma treatment, hold off on steroids given acute infection -Continue with acetaminophen for antipyretics -Spoke with Dr. Baxter Flattery of infectious diseases who is less concerned about infection and more concerned about possible PE -VQ scan was obtained given patient's renal status and was very low probability for PE.  Her D-dimer is slightly elevated at 1.46 but her lower extremity duplex from the left leg was negative for any VTE -Repeat chest x-ray today showed "Stable cardiomediastinal silhouette. No pneumothorax or pleural effusion is noted. Left lung is clear. Minimal right basilar subsegmental atelectasis is noted. The visualized skeletal structures are unremarkable." -Continue to monitor she has been afebrile now and has no leukocytosis however she still remains  on 2 L supplemental oxygen -She is given IV Lasix the day before yesterday and p.o. Lasix yesterday.  We will try IV Lasix today - Initial COVID testing negative in ED, follow up PCR testing sent and this was negative as well -Her sepsis physiology is improved significantly as she is now afebrile and is not tachycardic or tachypneic and blood pressure remained stable -We will get PT OT to evaluate and treat and they are recommending home health PT  Supraventricular Tachycardia status post conversion to normal sinus rhythm -Heart rates were into the 190s yesterday and she was given adenosine with conversion -Continue monitor telemetry in the progressive unit -Continue to monitor on telemetry   PCKD s/p transplant - Continue immunosuppressive medications nephrology was consulted and they recommending continuing them - Follow up cultures; she is at risk for fungal infections and concomitant viral infections like JC virus and BK virus.  It is reassuring that her renal function is stable.  -Her BUN /creatinine has gone from 18/0.78 -> 25/0.91 -> 26/0.93 -> 25/0.86 -Given IV Lasix 40 mg x1 again  -Continue monitor and trend and avoid nephrotoxic medications, contrast dyes, hypotension and renally adjust medications; wanted to order a CTA of the  chest to rule out PE however substituted this with the VQ scan and VQ scan showed low probability for pulmonary embolus with no perfusion defects -Repeat CMP in a.m and watch creatinine trend    Leg Edema -Per Patient is Chronic but will r/o DVT -Check LE Venous Duplex and left side did not show any blood clot -given another dose of IV Lasix today and will hold further Lasix dosing  Asthma, mild intermittent - Continue ICS/LABA - Continue prn albuterol  -Repeat chest x-ray this AM showed "Stable cardiomediastinal silhouette. No pneumothorax or pleural effusion is noted. Left lung is clear. Minimal right basilar subsegmental atelectasis is noted. The  visualized skeletal structures are unremarkable." -We will order a peak flow meter continue with incentive spirometry and flutter valve -Continue guaifenesin    GERD (gastroesophageal reflux disease) - Continue PPI    Acute on chronic Diastolic CHF (congestive heart failure) Hypoalbuminemia -Last TTE in May showed indeterminate diastolic parameters -Her breathing status is improved and she is off of supplemental oxygen via nasal cannula -She was given IV Lasix the day before yesterday for 40 mg and a p.o. dose of 40 Lasix yesterday.  Today she will get IV Lasix for anasarca -Continue to monitor volume status carefully and IV fluids have now been stopped by nephrology -Strict I's and O's and daily weights -She is -3.237 L since admission -BNP was elevated on admission was 574.8 -Continue to monitor for signs and symptoms of volume overload  Bronchitis, mucopurulent recurrent  - Per chart review from 2015 by Dr. Gwenette Greet: "The patient has no evidence for bronchiectasis on her high-resolution CT, and there is no evidence for hypogammaglobulinemia. One possibility for her recurrent pulmonary symptoms/infection is that it may be related to her aggressive immunosuppressive regimen. I have asked her to discuss this possibility with her transplant team. Her pulmonary function studies are not overly impressive, but there is the suggestion of subtle airflow obstruction by her flow volume loop. Her history is suggestive of recurrent asthmatic bronchitis, and therefore I would like to try her on a LABA/ICS to see if that makes a difference. I will use an inhaler with a low inhaled steroid dose since she is on low-dose prednisone for her transplant. I would also like to do a scan of her sinuses to make sure that is not the source of her recurrent infections/symptoms. If this is unremarkable, I can really find no source for her recurrent symptoms, and we need to strongly consider the role that her  immunosuppression may be playing." - Curious if this may be another recurrence related to her transplant medications as CXR showed "Shallow inspiration with stable cardiomegaly, mild pulmonary edema and trace bilateral pleural effusion.  Streaky infrahilar consolidation, probably a nonspecific bronchitis. No pneumonia." -See above -Continue with guaifenesin, flutter valve and incentive spirometry -Continue with IV ceftriaxone as above and continue for at least 5 days and transition to p.o.  Mild Hyponatremia -Was 132 on admission and is now 141 -Continue to monitor and trend and repeat CMP in a.m.  Metabolic Acidosis -Mild.  CO2 is n now 25, chloride level is 112, and anion gap is 9 -IV fluid hydration is now stopped  -Continue to monitor and trend and repeat CMP in a.m.  Hyperkalemia,, improved -Mild and patient states that her potassium runs around 5 usually  -Patient's potassium yesterday was 5.3 and today is now 4.6 -She was given IV Lasix 40 mg x 1 the day before yesterday and will be given  p.o. 40 Lasix yesterday morning and will be given IV Lasix today -Nephrology recommending to start Lokelma 10 mg p.o. daily if potassium gets over 5.5 -Continue monitor and trend and repeat CMP in a.m.  Normocytic Anemia -Patient's hemoglobin/hematocrit was 7.6/27.7 and has been relatively stable last 3 days at 7.4/26.6 -Checked anemia panel showed an iron level of 88, U IBC of 109, TIBC of 197, saturation ratios of 45%, ferritin level 155, folate level 6.4, vitamin B12 795 -Continue to monitor for signs and symptoms of bleeding; currently no overt bleeding note -Repeat CBC in a.m.  Recent Hip Surgery -Patient complaining of Left Hip Pain -Obtain X-Ray showed "Changes consistent with prior left hip replacement. Healing pubic rami fractures on the left are noted. Increased fragmentation of the greater trochanter fragment seen on the prior exam." -We will resume her gabapentin -We will get PT  and OT to evaluate and treat and they are recommending home health  Glaucoma -Continue with home eyedrops of dorzolamide, latanoprost and brimonidine  Hypophosphatemia -Patient's phosphorus level was 2.4  -Replete with p.o. K-Phos neutral 500 mg x 1  -Continue to monitor and replete as necessary -Repeat phosphorus level in the a.m.  DVT prophylaxis: Enoxaparin 40 mg sq q24h Code Status: FULL CODE  Family Communication: No Family present at bedside  Disposition Plan: Patient is from home and likely can be discharged when she has further clinical improvement.  Her oxygenation is improving and will wean her oxygen prior to discharge however we will also obtain a PT OT to evaluate and treat given her generalized weakness.  She is given a dose of IV Lasix today and will continue monitor volume status and if her blood status is improved and she is no longer requiring supplemental oxygen tomorrow and has had her home O2 screen done.  Will discharge home with home health physical therapy given PT recommendations today.  Consultants:   Nephrology    Procedures: None   Antimicrobials:  Anti-infectives (From admission, onward)   Start     Dose/Rate Route Frequency Ordered Stop   02/02/20 1000  cefTRIAXone (ROCEPHIN) 2 g in sodium chloride 0.9 % 100 mL IVPB     2 g 200 mL/hr over 30 Minutes Intravenous Every 24 hours 02/02/20 0918     02/01/20 2200  vancomycin (VANCOCIN) IVPB 1000 mg/200 mL premix  Status:  Discontinued     1,000 mg 200 mL/hr over 60 Minutes Intravenous Every 24 hours 01/31/20 2155 02/02/20 0918   01/31/20 2200  ceFEPIme (MAXIPIME) 2 g in sodium chloride 0.9 % 100 mL IVPB  Status:  Discontinued     2 g 200 mL/hr over 30 Minutes Intravenous Every 8 hours 01/31/20 2155 02/02/20 0918   01/31/20 2200  vancomycin (VANCOREADY) IVPB 1750 mg/350 mL     1,750 mg 175 mL/hr over 120 Minutes Intravenous  Once 01/31/20 2155 02/01/20 0238   01/31/20 2145  metroNIDAZOLE (FLAGYL) IVPB 500  mg     500 mg 100 mL/hr over 60 Minutes Intravenous  Once 01/31/20 2141 01/31/20 2308     Subjective: Seen and examined at bedside and she states that she is feeling much better.  Denies any nausea or vomiting.  Off of the oxygen now.  Still has some lower extremity swelling but states that she is feeling better.  States that she starts feeling off in her lungs every so often and every few weeks.  No other concerns or complaints at this time.  Objective: Vitals:   02/04/20  6160 02/04/20 0832 02/04/20 0955 02/04/20 1005  BP: (!) 122/56  (!) 125/55 (!) 141/64  Pulse: 86     Resp: 19  17 (!) 24  Temp: 98.3 F (36.8 C)     TempSrc: Oral     SpO2: 96% 97%    Weight:      Height:        Intake/Output Summary (Last 24 hours) at 02/04/2020 1636 Last data filed at 02/03/2020 2348 Gross per 24 hour  Intake --  Output 900 ml  Net -900 ml   Filed Weights   01/31/20 1948 02/02/20 0511 02/03/20 0357  Weight: 80 kg 78 kg 77.6 kg   Examination: Physical Exam:  Constitutional: Well-nourished, well-developed obese Caucasian female currently no acute distress appears calm and a little bit more comfortable today and she does not require any supplemental oxygen via nasal cannula Eyes: Lids and conjunctivae normal, sclerae anicteric  ENMT: External Ears, Nose appear normal. Grossly normal hearing.  Neck: Appears normal, supple, no cervical masses, normal ROM, no appreciable thyromegaly; no JVD Respiratory: Mildly diminished to auscultation bilaterally with coarse breath sounds, no wheezing, rales, rhonchi or crackles. Normal respiratory effort and patient is not tachypenic. No accessory muscle use.  She has unlabored breathing and she is not wearing any supplemental oxygen via nasal cannula Cardiovascular: RRR, no murmurs / rubs / gallops. S1 and S2 auscultated. 1+ left leg edema is slightly worse than right Abdomen: Soft, non-tender, distended secondary body habitus. Bowel sounds positive.  GU:  Deferred. Musculoskeletal: No clubbing / cyanosis of digits/nails. No joint deformity upper and lower extremities.  Skin: No rashes, lesions, ulcers on limited skin evaluation. No induration; Warm and dry.  Neurologic: CN 2-12 grossly intact with no focal deficits. Romberg sign and cerebellar reflexes not assessed.  Psychiatric: Normal judgment and insight. Alert and oriented x 3. Normal mood and appropriate affect.   Data Reviewed: I have personally reviewed following labs and imaging studies  CBC: Recent Labs  Lab 01/31/20 2007 01/31/20 2007 01/31/20 2229 02/01/20 0628 02/02/20 0347 02/03/20 0205 02/04/20 0347  WBC 9.2  --   --  9.8 8.1 7.9 5.4  NEUTROABS 8.1*  --   --   --  7.2 6.8 4.3  HGB 8.7*   < > 7.5* 7.6* 7.4* 7.4* 7.4*  HCT 30.3*   < > 22.0* 27.7* 26.1* 25.6* 26.6*  MCV 98.1  --   --  98.6 97.4 98.5 98.9  PLT 293  --   --  247 230 203 210   < > = values in this interval not displayed.   Basic Metabolic Panel: Recent Labs  Lab 01/31/20 2007 01/31/20 2007 01/31/20 2229 02/01/20 7371 02/01/20 0848 02/02/20 0347 02/03/20 0205 02/04/20 0347  NA 133*   < > 132*  --  136 141 140 141  K 4.4   < > 4.7  --  5.1 5.3* 4.6 4.1  CL 102  --   --   --  107 111 110 107  CO2 22  --   --   --  21* 21* 20* 25  GLUCOSE 136*  --   --   --  173* 134* 109* 112*  BUN 13  --   --   --  18 25* 26* 25*  CREATININE 0.92   < >  --  0.89 0.78 0.91 0.93 0.86  CALCIUM 9.2  --   --   --  8.8* 9.3 9.4 9.4  MG  --   --   --   --  2.1 2.2 2.1 1.8  PHOS  --   --   --   --  4.5 4.1 3.1 2.4*   < > = values in this interval not displayed.   GFR: Estimated Creatinine Clearance: 65.4 mL/min (by C-G formula based on SCr of 0.86 mg/dL). Liver Function Tests: Recent Labs  Lab 01/31/20 2007 02/01/20 0848 02/02/20 0347 02/03/20 0205 02/04/20 0347  AST 14* 13* 12* 17 12*  ALT 8 8 7 7 8   ALKPHOS 143* 130* 111 105 97  BILITOT 1.2 1.1 0.6 1.0 0.9  PROT 6.9 6.5 6.1* 6.1* 6.1*  ALBUMIN 2.5* 2.3*  2.2* 2.2* 2.2*   No results for input(s): LIPASE, AMYLASE in the last 168 hours. No results for input(s): AMMONIA in the last 168 hours. Coagulation Profile: Recent Labs  Lab 01/31/20 2007 02/01/20 9470  INR 1.2 1.4*   Cardiac Enzymes: No results for input(s): CKTOTAL, CKMB, CKMBINDEX, TROPONINI in the last 168 hours. BNP (last 3 results) No results for input(s): PROBNP in the last 8760 hours. HbA1C: No results for input(s): HGBA1C in the last 72 hours. CBG: No results for input(s): GLUCAP in the last 168 hours. Lipid Profile: No results for input(s): CHOL, HDL, LDLCALC, TRIG, CHOLHDL, LDLDIRECT in the last 72 hours. Thyroid Function Tests: No results for input(s): TSH, T4TOTAL, FREET4, T3FREE, THYROIDAB in the last 72 hours. Anemia Panel: Recent Labs    02/03/20 0205  VITAMINB12 795  FOLATE 6.4  FERRITIN 155  TIBC 197*  IRON 88  RETICCTPCT 2.2   Sepsis Labs: Recent Labs  Lab 01/31/20 2007 02/01/20 0628 02/01/20 0835  PROCALCITON 0.41 0.71  --   LATICACIDVEN 1.1  --  0.8    Recent Results (from the past 240 hour(s))  SARS CORONAVIRUS 2 (TAT 6-24 HRS) Nasopharyngeal Nasopharyngeal Swab     Status: None   Collection Time: 01/31/20 12:54 AM   Specimen: Nasopharyngeal Swab  Result Value Ref Range Status   SARS Coronavirus 2 NEGATIVE NEGATIVE Final    Comment: (NOTE) SARS-CoV-2 target nucleic acids are NOT DETECTED. The SARS-CoV-2 RNA is generally detectable in upper and lower respiratory specimens during the acute phase of infection. Negative results do not preclude SARS-CoV-2 infection, do not rule out co-infections with other pathogens, and should not be used as the sole basis for treatment or other patient management decisions. Negative results must be combined with clinical observations, patient history, and epidemiological information. The expected result is Negative. Fact Sheet for Patients: SugarRoll.be Fact Sheet for  Healthcare Providers: https://www.woods-mathews.com/ This test is not yet approved or cleared by the [place] FDA and  has been authorized for detection and/or diagnosis of SARS-CoV-2 by FDA under an Emergency Use Authorization (EUA). This EUA will remain  in effect (meaning this test can be used) for the duration of the COVID-19 declaration under Section 56 4(b)(1) of the Act, 21 U.S.C. section 360bbb-3(b)(1), unless the authorization is terminated or revoked sooner. Performed at [facility], [place] [address], [place], [place] [address]   Blood Culture (routine x 2)     Status: None (Preliminary result)   Collection Time: 01/31/20  8:15 PM   Specimen: BLOOD RIGHT HAND  Result Value Ref Range Status   Specimen Description BLOOD RIGHT HAND  Final   Special Requests   Final    BOTTLES DRAWN AEROBIC AND ANAEROBIC Blood Culture adequate volume   Culture   Final    NO GROWTH 4 DAYS Performed at [facility], [place]  [address] [place], [place] [address]    Report Status PENDING  Incomplete  Urine culture     Status: Abnormal   Collection Time: 01/31/20  8:15 PM   Specimen: In/Out Cath Urine  Result Value Ref Range Status   Specimen Description IN/OUT CATH URINE  Final   Special Requests   Final    NONE Performed at [facility], [place] [address] [place], [place] [address]    Culture MULTIPLE SPECIES PRESENT, SUGGEST RECOLLECTION (A)  Final   Report Status 02/01/2020 FINAL  Final  Blood Culture (routine x 2)     Status: None (Preliminary result)   Collection Time: 02/01/20  8:58 AM   Specimen: BLOOD RIGHT ARM  Result Value Ref Range Status   Specimen Description BLOOD RIGHT ARM  Final   Special Requests   Final    AEROBIC BOTTLE ONLY Blood Culture results may not be optimal due to an inadequate volume of blood received in culture bottles   Culture   Final    NO GROWTH 3 DAYS Performed at [facility], [place] [address], [place], [place]  [address]    Report Status PENDING  Incomplete     RN Pressure Injury Documentation: Pressure Injury 02/01/20 Buttocks Left (Active)  02/01/20 1457  Location: Buttocks  Location Orientation: Left  Staging:   Wound Description (Comments):   Present on Admission: Yes     Estimated body mass index is 31.29 kg/m as calculated from the following:   Height as of this encounter: 5\' 2"  (1.575 m).   Weight as of this encounter: 77.6 kg.  Malnutrition Type:      Malnutrition Characteristics:      Nutrition Interventions:     Radiology Studies: DG CHEST PORT 1 VIEW  Result Date: 02/04/2020 CLINICAL DATA:  Respiratory distress.  Short of breath. EXAM: PORTABLE CHEST 1 VIEW COMPARISON:  02/03/2020 and older exams. FINDINGS: Cardiac silhouette normal in size.  No mediastinal or hilar masses. Linear opacities are noted at the lung bases consistent with scarring or atelectasis, stable. Remainder of the lungs is clear. No convincing pleural effusion.  No pneumothorax. Skeletal structures are demineralized but grossly intact. IMPRESSION: 1. No acute cardiopulmonary disease and no change from the previous day's exam. Electronically Signed   By: Lajean Manes M.D.   On: 02/04/2020 08:37   DG CHEST PORT 1 VIEW  Result Date: 02/03/2020 CLINICAL DATA:  Shortness of breath EXAM: PORTABLE CHEST 1 VIEW COMPARISON:  02/02/2020 FINDINGS: Mild right basilar opacity, likely atelectasis. Lungs otherwise clear. No pleural effusion or pneumothorax. Mild cardiomegaly. IMPRESSION: No evidence of acute cardiopulmonary disease. Electronically Signed   By: Julian Hy M.D.   On: 02/03/2020 09:21   Scheduled Meds: . allopurinol  300 mg Oral Daily  . aspirin  325 mg Oral Q breakfast  . brimonidine  1 drop Left Eye BID  . docusate sodium  100 mg Oral BID  . dorzolamide  1 drop Both Eyes Daily  . enoxaparin (LOVENOX) injection  40 mg Subcutaneous Daily  . FLUoxetine  40 mg Oral Daily  . gabapentin  200 mg  Oral TID  . guaiFENesin  1,200 mg Oral BID  . latanoprost  1 drop Both Eyes QHS  . magnesium oxide  400 mg Oral BID  . mouth rinse  15 mL Mouth Rinse BID  . mometasone-formoterol  2 puff Inhalation BID  . mycophenolate  180 mg Oral BID  . pantoprazole  40 mg Oral BID  .  predniSONE  7.5 mg Oral Q breakfast  . saccharomyces boulardii  250 mg Oral BID  . sodium chloride flush  3 mL Intravenous Q12H  . tacrolimus  0.5 mg Oral QHS  . tacrolimus  1 mg Oral Daily   Continuous Infusions: . sodium chloride 500 mL (02/02/20 0603)  . cefTRIAXone (ROCEPHIN)  IV 2 g (02/03/20 1627)    LOS: 4 days   Kerney Elbe, DO [facility] PAGER is on [place]  If 7PM-7AM, please contact night-coverage [email or web address]

## 2020-02-04 NOTE — Progress Notes (Addendum)
Patient had a 17 beat run of V-tach, patient appears asymptomatic. Kennon Holter, MD notified. Will continue to monitor.   Elaina Hoops, RN

## 2020-02-04 NOTE — Evaluation (Signed)
Physical Therapy Evaluation Patient Details Name: April [name] MRN: [id number] DOB: 06/18/1956 Today's Date: 02/04/2020   History of Present Illness  64 y.o. 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 Recent hip and pelvic fxs in January.  Clinical Impression  Pt presents to PT with deficits in functional mobility, gait, balance, endurance, strength, power. Pt was independent prior to fall and L hip fx, since CIR admission has been working with PT on strengthening and standing. Pt continues to require physical assistance to perform functional mobility tasks, and is unable to safely ambulate at this time due to weakness. Pt will continue to benefit from aggressive mobilization to improve mobility and reduce caregiver burden. PT and pt discuss possibility of inpatient rehab stay and patient declines, preferring to discharge home. Pt is not far from her recent baseline since CIR and has the equipment to safely discharge home with family support. PT recommends continued HHPT services.    Follow Up Recommendations Home health PT;Supervision/Assistance - 24 hour(pt refusing SNF at this time)    Equipment Recommendations  None recommended by PT(pt owns necessary DME)    Recommendations for Other Services       Precautions / Restrictions Precautions Precautions: Fall Restrictions Weight Bearing Restrictions: Yes LUE Weight Bearing: Weight bearing as tolerated LLE Weight Bearing: Weight bearing as tolerated      Mobility  Bed Mobility Overal bed mobility: Needs Assistance Bed Mobility: Supine to Sit     Supine to sit: Min assist;HOB elevated     General bed mobility comments: modA to scoot to edge of bed  Transfers Overall transfer level: Needs assistance Equipment used: 1 person hand held assist Transfers: Sit to/from [facility] Sit to Stand:  Mod assist Stand pivot transfers: Total assist(STEDY)       General transfer comment: PT providing L knee block and BUE support to pt  Ambulation/Gait                Stairs            Wheelchair Mobility    Modified Rankin (Stroke Patients Only)       Balance Overall balance assessment: Needs assistance Sitting-balance support: No upper extremity supported;Feet supported Sitting balance-Leahy Scale: Good Sitting balance - Comments: modI for static sitting balance   Standing balance support: Bilateral upper extremity supported Standing balance-Leahy Scale: Poor Standing balance comment: modA with BUE support of PT                             Pertinent Vitals/Pain Pain Assessment: Faces Faces Pain Scale: Hurts even more Pain Location: L hip Pain Descriptors / Indicators: Aching Pain Intervention(s): Limited activity within patient's tolerance    Home Living Family/patient expects to be discharged to:: Private residence Living Arrangements: Spouse/significant other;Children Available Help at Discharge: Family;Available 24 hours/day Type of Home: House Home Access: Ramped entrance     Home Layout: Two level;Able to live on main level with bedroom/bathroom Home Equipment: Gilford Rile - 2 wheels;Wheelchair - [employer];Shower seat;Adaptive equipment;Hospital bed      Prior Function Level of Independence: Independent with assistive device(s)         Comments: independent with RW prior to fall, since CIR stay has been mostly in bed, working with HHPT on standing and strengthening     Hand Dominance   Dominant Hand:  Right    Extremity/Trunk Assessment   Upper Extremity Assessment Upper Extremity Assessment: Generalized weakness    Lower Extremity Assessment Lower Extremity Assessment: Generalized weakness    Cervical / Trunk Assessment Cervical / Trunk Assessment: Kyphotic  Communication   Communication: No difficulties   Cognition Arousal/Alertness: Awake/alert Behavior During Therapy: WFL for tasks assessed/performed Overall Cognitive Status: Within Functional Limits for tasks assessed                                        General Comments General comments (skin integrity, edema, etc.): VSS on RA    Exercises     Assessment/Plan    PT Assessment Patient needs continued PT services  PT Problem List Decreased strength;Decreased activity tolerance;Decreased balance;Decreased mobility;Decreased knowledge of use of DME;Pain       PT Treatment Interventions DME instruction;Gait training;Functional mobility training;Therapeutic activities;Therapeutic exercise;Balance training;Neuromuscular re-education;Patient/family education;Wheelchair mobility training    PT Goals (Current goals can be found in the Care Plan section)  Acute Rehab PT Goals Patient Stated Goal: To continue improving strength and mobility PT Goal Formulation: With patient Time For Goal Achievement: 02/18/20 Potential to Achieve Goals: Good    Frequency Min 3X/week   Barriers to discharge        Co-evaluation               AM-PAC PT "6 Clicks" Mobility  Outcome Measure Help needed turning from your back to your side while in a flat bed without using bedrails?: A Little Help needed moving from lying on your back to sitting on the side of a flat bed without using bedrails?: A Little Help needed moving to and from a bed to a chair (including a wheelchair)?: A Lot Help needed standing up from a chair using your arms (e.g., wheelchair or bedside chair)?: A Lot Help needed to walk in hospital room?: Total Help needed climbing 3-5 steps with a railing? : Total 6 Click Score: 12    End of Session   Activity Tolerance: Patient tolerated treatment well Patient left: in chair;with call bell/phone within reach Nurse Communication: Mobility status;Need for lift equipment PT Visit Diagnosis: Muscle weakness  (generalized) (M62.81);History of falling (Z91.81)    Time: 5056-9794 PT Time Calculation (min) (ACUTE ONLY): 31 min   Charges:   PT Evaluation $PT Eval Moderate Complexity: 1 Mod PT Treatments $Therapeutic Activity: 8-22 mins        Zenaida Niece, PT, DPT Acute Rehabilitation Pager: [phone]   Zenaida Niece 02/04/2020, 11:17 AM

## 2020-02-05 ENCOUNTER — Inpatient Hospital Stay (HOSPITAL_COMMUNITY): Payer: Medicare Other

## 2020-02-05 LAB — CBC WITH DIFFERENTIAL/PLATELET
Abs Immature Granulocytes: 0.08 10*3/uL — ABNORMAL HIGH (ref 0.00–0.07)
Basophils Absolute: 0 10*3/uL (ref 0.0–0.1)
Basophils Relative: 0 %
Eosinophils Absolute: 0 10*3/uL (ref 0.0–0.5)
Eosinophils Relative: 1 %
HCT: 27.3 % — ABNORMAL LOW (ref 36.0–46.0)
Hemoglobin: 7.7 g/dL — ABNORMAL LOW (ref 12.0–15.0)
Immature Granulocytes: 1 %
Lymphocytes Relative: 13 %
Lymphs Abs: 0.8 10*3/uL (ref 0.7–4.0)
MCH: 27.3 pg (ref 26.0–34.0)
MCHC: 28.2 g/dL — ABNORMAL LOW (ref 30.0–36.0)
MCV: 96.8 fL (ref 80.0–100.0)
Monocytes Absolute: 0.3 10*3/uL (ref 0.1–1.0)
Monocytes Relative: 6 %
Neutro Abs: 4.5 10*3/uL (ref 1.7–7.7)
Neutrophils Relative %: 79 %
Platelets: 190 10*3/uL (ref 150–400)
RBC: 2.82 MIL/uL — ABNORMAL LOW (ref 3.87–5.11)
RDW: 20.1 % — ABNORMAL HIGH (ref 11.5–15.5)
WBC: 5.7 10*3/uL (ref 4.0–10.5)
nRBC: 0 % (ref 0.0–0.2)

## 2020-02-05 LAB — COMPREHENSIVE METABOLIC PANEL
ALT: 9 U/L (ref 0–44)
AST: 13 U/L — ABNORMAL LOW (ref 15–41)
Albumin: 2.5 g/dL — ABNORMAL LOW (ref 3.5–5.0)
Alkaline Phosphatase: 114 U/L (ref 38–126)
Anion gap: 8 (ref 5–15)
BUN: 24 mg/dL — ABNORMAL HIGH (ref 8–23)
CO2: 25 mmol/L (ref 22–32)
Calcium: 9.3 mg/dL (ref 8.9–10.3)
Chloride: 107 mmol/L (ref 98–111)
Creatinine, Ser: 0.93 mg/dL (ref 0.44–1.00)
GFR calc Af Amer: >60 mL/min (ref >60)
GFR calc non Af Amer: >60 mL/min (ref >60)
Glucose, Bld: 115 mg/dL — ABNORMAL HIGH (ref 70–99)
Potassium: 3.8 mmol/L (ref 3.5–5.1)
Sodium: 140 mmol/L (ref 135–145)
Total Bilirubin: 0.9 mg/dL (ref 0.3–1.2)
Total Protein: 6.3 g/dL — ABNORMAL LOW (ref 6.5–8.1)

## 2020-02-05 LAB — MAGNESIUM: Magnesium: 1.9 mg/dL (ref 1.7–2.4)

## 2020-02-05 LAB — CULTURE, BLOOD (ROUTINE X 2)
Culture: NO GROWTH
Special Requests: ADEQUATE

## 2020-02-05 LAB — PHOSPHORUS: Phosphorus: 2.5 mg/dL (ref 2.5–4.6)

## 2020-02-05 MED ORDER — LIDOCAINE 5 % EX PTCH
1.0000 | MEDICATED_PATCH | CUTANEOUS | Status: DC
  Administered 2020-02-05 – 2020-02-06 (×2): 1 via TRANSDERMAL
  Filled 2020-02-05 (×2): qty 1

## 2020-02-05 MED ORDER — FUROSEMIDE 40 MG PO TABS
40.0000 mg | ORAL_TABLET | Freq: Every day | ORAL | Status: DC
  Administered 2020-02-05 – 2020-02-06 (×2): 40 mg via ORAL
  Filled 2020-02-05 (×2): qty 1

## 2020-02-05 NOTE — Progress Notes (Signed)
Inpatient Rehabilitation Admissions Coordinator  Rehab Admissions Coordinator Note:  Patient was screened by Cleatrice Burke for appropriateness for an Inpatient Acute Rehab Consult per therapy recs.   At this time, we are recommending Inpatient Rehab consult. I will place order per protocol.  Cleatrice Burke RN MSN 02/05/2020, 10:54 AM  I can be reached at [phone].

## 2020-02-05 NOTE — TOC Initial Note (Signed)
Transition of Care [facility]) - Initial/Assessment Note    Patient Details  Name: April [name] MRN: [id number] Date of Birth: [birth year]  Transition of Care [facility]) CM/SW Contact:    Arvella Merles, LCSW Phone Number: 02/05/2020, 2:28 PM  Clinical Narrative:                 CSW received consult for possible SNF placement at time of discharge, if CIR is not available to patient. CSW met with patient bedside to discuss recommendation of PT upon discharge. Patient expressed she would like CIR but if she unable to be admitted she will be going home. She expressed concern with being susceptible to Covid at a SNF, as well as concerned with her mental well-being considering current visitation restrictions. Patient stated her daughter is an OT and she has help in the home. Patient stated she has previously had [facility] and she would like to receive [facility] if she returns home. CSW expressed understanding and requested patient to inform staff if anything changes and she would like to go to SNF. No further questions reported at this time. CSW will continue to follow and assist with discharge planning needs.   Expected Discharge Plan: IP Rehab Facility Barriers to Discharge: Ship broker, Continued Medical Work up   Patient Goals and CMS Choice   CMS Medicare.gov Compare Post Acute Care list provided to:: Patient Choice offered to / list presented to : Patient  Expected Discharge Plan and Services Expected Discharge Plan: [place]       Living arrangements for the past 2 months: Single Family Home                                      Prior Living Arrangements/Services Living arrangements for the past 2 months: Single Family Home Lives with:: Spouse Patient language and need for interpreter reviewed:: Yes Do you feel safe going back to the place where you live?: Yes      Need for Family Participation in Patient Care: No (Comment) Care giver support system in place?: Yes  (comment)   Criminal Activity/Legal Involvement Pertinent to Current Situation/Hospitalization: No - Comment as needed  Activities of Daily Living Home Assistive Devices/Equipment: Wheelchair ADL Screening (condition at time of admission) Patient's cognitive ability adequate to safely complete daily activities?: Yes Is the patient deaf or have difficulty hearing?: No Does the patient have difficulty seeing, even when wearing glasses/contacts?: No Does the patient have difficulty concentrating, remembering, or making decisions?: No Patient able to express need for assistance with ADLs?: Yes Does the patient have difficulty dressing or bathing?: No Independently performs ADLs?: Yes (appropriate for developmental age) Does the patient have difficulty walking or climbing stairs?: Yes Weakness of Legs: Both Weakness of Arms/Hands: Left  Permission Sought/Granted                  Emotional Assessment Appearance:: Appears stated age Attitude/Demeanor/Rapport: Engaged Affect (typically observed): Appropriate Orientation: : Oriented to Self, Oriented to  Time, Oriented to Situation, Oriented to Place Alcohol / Substance Use: Not Applicable Psych Involvement: No (comment)  Admission diagnosis:  Shortness of breath [R06.02] Hypoxia [R09.02] Sepsis ([place]) [A41.9] Patient Active Problem List   Diagnosis Date Noted  . Sepsis ([place]) 01/31/2020  . Hypoxia   . Shortness of breath   . Fracture of left superior pubic ramus ([facility])   . Pain   . Anxiety state   .  Left displaced femoral neck fracture ([place]) 12/15/2019  . Status post total hip replacement, left   . Post-op pain   . Acute blood loss anemia   . Polycystic kidney   . Closed left hip fracture ([place]) 12/07/2019  . Left wrist fracture 12/07/2019  . Medication management 09/22/2019  . Physical deconditioning 09/22/2019  . Hyponatremia 06/29/2019  . Acute respiratory failure with hypoxia ([place]) 04/19/2019  . Acute on chronic  diastolic CHF (congestive heart failure) ([place]) 04/19/2019  . Bilateral closed proximal tibial fracture 12/21/2018  . Closed right ankle fracture 12/21/2018  . Fracture 12/21/2018  . Lower urinary tract infectious disease 10/03/2018  . Asthma, chronic, unspecified asthma severity, with acute exacerbation 10/03/2018  . SIRS (systemic inflammatory response syndrome) ([place]) 10/03/2018  . Nausea vomiting and diarrhea 10/02/2018  . Chronic diastolic CHF (congestive heart failure) ([place]) 10/01/2017  . Influenza B 10/01/2017  . Persistent asthma with status asthmaticus   . Pneumonia 07/15/2016  . Asthma, mild intermittent 07/15/2016  . GERD (gastroesophageal reflux disease) 07/15/2016  . Renal transplant recipient 07/15/2016  . Hyperlipidemia 07/15/2016  . Depression 07/15/2016  . Bronchitis, mucopurulent recurrent ([place]) 08/08/2014  . Chronic cough 07/24/2014  . End stage renal disease ([place]) 03/23/2012  . 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, implant, and graft 03/23/2012   PCP:  [name], MD Pharmacy:   [facility], [address] [place] [address] Phone: [phone] Fax: [phone]     Social Determinants of Health (SDOH) Interventions    Readmission Risk Interventions Readmission Risk Prevention Plan 04/20/2019  Transportation Screening Complete  PCP or Specialist Appt within 3-5 Days Not Complete  Not Complete comments not yet ready for d/c  HRI or [place] Not Complete  HRI or Home Care Consult comments no needs identified  Social Work Consult for [place] Planning/Counseling Not Complete  SW consult not completed comments no needs identified  Palliative Care Screening Not Applicable  Medication Review Press photographer) Complete  Some recent data might be hidden

## 2020-02-05 NOTE — Progress Notes (Signed)
PROGRESS NOTE    April [name]  XAJ:[id number] DOB: 1956-05-17 DOA: 01/31/2020 PCP: [name], MD   Brief Narrative:  HPI per Dr. Gilles Chiquito on 01/31/20 April [name] is a 64 y.o. 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  She reports that her symptoms started this morning.  She became more and more short of breath.  She has this issue sometimes and takes her home asthma medications, however, she was not able to get the symptoms under control.  Usually when she cannot get it under control, she will come to the hospital to take steroids.  She was noted by EMS to have saturation to 74% on RA.  She improved with medications to 90% on RA and then to 96% with 2LNC.  She further had an elevated HR with palpitations, fever to 103F which improved with tylenol and tachypnea.  She notes 1 day of productive cough, chills, sneezing, sinus congestion, nausea.  She has had no sick contacts and no contacts with coronavirus.  She is remaining at home and waiting to get her vaccine.  She denied any urinary symptoms, change in urination, chest pain, pain with deep breathing.  She does have chronic skin changes, a small area of soreness on her left buttock and some chronic LE swelling, worse on the left.  She notes that she had a hip replacement in January and also had pelvic fractures which have limited her mobility recently.  She has no headache or vision changes.    ED Course: In the ED, she was found to be Febrile with an elevated HR and RR with hypoxia.  She was noted to have a normal WBC.  CXR showed only pulmonary edema, but no obvious pneumonia.  She may have had a bronchitis.  Blood cultures and urine culture were obtained.  She was started on antibiotics for an unknown source in an immunocompromised person.  Initial COVID Ag testing is negative  **Interim History She states that  she is feeling much better nephrology was consulted for further evaluation recommendations.  Nephrology recommends continuing her current Immunosuppressive medications and they have stopped IV fluids.  Broad-spectrum antibiotics were changed to IV ceftriaxone after discussion with Dr. Graylon Good of infectious diseases.  Dr. Graylon Good was concerned for possible PE however her VQ scan shows very low probability for pulmonary embolus.  Her lower extremity duplex negative for DVT in the left leg.  Was given a dose of IV Lasix yesterday and will be given a dose of p.o. Lasix as she states that she is feeling better.  Will obtain PT OT evaluation prior to discharge for safe discharge disposition.  Nephrology has now signed off the case.  Patient was given another dose of IV Lasix yesterday and and we were anticipating discharging home this a.m. given the patient had initially refused SNF but now OT PT recommending possible CIR and patient is interested in this.  She is more open to also going to SNF so we will continue monitor for safe discharge disposition and see if she qualifies for inpatient rehab versus SNF and if she is agreeable otherwise she will be going home with home health services.  Assessment & Plan:   Active Problems:   Bronchitis, mucopurulent recurrent (HCC)   Asthma, mild intermittent   GERD (gastroesophageal reflux disease)   Hyperlipidemia   Chronic diastolic CHF (congestive heart failure) ([place])  Acute respiratory failure with hypoxia (HCC)   Sepsis ([place])  Sepsis in a transplant recipient, source unknown likely it was from her mucopurulent bronchitis Acute respiratory failure with hypoxia  in the setting of above versus an acute on chronic diastolic CHF exacerbation or anemia -She currently does not have a leukocytosis anymore and her WBC is now 7.9 - Sources include lung, skin, urinary, implanted material (fistula) unclear what her etiology is -Though this could certainly be COVID related  this is highly unlikely, she does not have a CXR which is consistent with viral pneumonia, she does not have lymphopenia and no contacts.  She is at high risk for a bacterial infection given her immunosuppresion, and based on chart review, she has had recurrent mucopurulent bronchitis in the past -Initial CXR did show "Shallow inspiration with stable cardiomegaly, mild pulmonary edema and trace bilateral pleural effusion. Streaky infrahilar consolidation, probably a nonspecific bronchitis. No pneumonia." -ABG showed pH of 7.45, PCO2 29.6, PO2 of 187, bicarbonate level of 20.1 and an ABG O2 saturation of 100% -Check PCT 0.41 and slightly worsened to 0.71 -Trend Lactic acid and went from 1.1 is now 0.8 - Cefepime, metronidazole, vancomycin started given unknown source but will change to IV ceftriaxone given infectious disease recommendations and will continue for now -Blood cultures and urine culture obtained however source is unlikely to be the urine as her urinalysis was clear which showed negative nitrites, negative leukocytes and no bacteria seen - Progressive unit, telemetry - IVF with NS at 100cc/hr now stopped by nephrology -Patient's cortisol level is 8.8 -SpO2: 100 % O2 Flow Rate (L/min): 2 L/min FiO2 (%): 95 %; Now patient has been weaned off of supplemental oxygen via nasal cannula and is saturating well on room air  -We will give another dose of IV Lasix yesterday and will resume p.o. Lasix 40 mg daily today - Oxygen to keep O2 sats > 90% - Continue asthma treatment, hold off on steroids given acute infection -Continue with acetaminophen for antipyretics -Spoke with Dr. Baxter Flattery of infectious diseases who is less concerned about infection and more concerned about possible PE -VQ scan was obtained given patient's renal status and was very low probability for PE.  Her D-dimer is slightly elevated at 1.46 but her lower extremity duplex from the left leg was negative for any VTE -Repeat chest  x-ray today showed "Lung volumes are low. No consolidative airspace disease. No pleural effusions. No evidence of pulmonary edema. No pneumothorax. Heart size is mildly enlarged. Upper mediastinal contours are within normal limits." -Continue to monitor she has been afebrile now and has no leukocytosis however she still remains on 2 L supplemental oxygen -Status post IV Lasix x2 and will resume p.o. Lasix 40 mg daily now - Initial COVID testing negative in ED, follow up PCR testing sent and this was negative as well -Her sepsis physiology is improved significantly as she is now afebrile and is not tachycardic or tachypneic and blood pressure remained stable -We will get PT OT to evaluate and treat and and they were initially recommending SNF level of care patient has refused initially but now is more open to it and she is now open to going to CIR.  -CIR MD was consulted for inpatient rehab consultation and OT is recommending CIR at this time. -We will await further discharge disposition and the patient will end up going back to CIR versus SNF versus going home with home health and this is currently undetermined at this time.  Supraventricular Tachycardia status post conversion to normal sinus rhythm -Heart rates were into the 190s yesterday and she was given adenosine with conversion -Continue monitor telemetry in the progressive unit -Continue to monitor on telemetry   PCKD s/p transplant - Continue immunosuppressive medications nephrology was consulted and they recommending continuing them - Follow up cultures; she is at risk for fungal infections and concomitant viral infections like JC virus and BK virus.  It is reassuring that her renal function is stable.  -Her BUN /creatinine has gone from 18/0.78 -> 25/0.91 -> 26/0.93 -> 25/0.86 -> 24/0.93 -Given IV Lasix 40 mg x1 again yesterday and will start on p.o. Lasix 40 mg -Continue monitor and trend and avoid nephrotoxic medications, contrast  dyes, hypotension and renally adjust medications; wanted to order a CTA of the chest to rule out PE however substituted this with the VQ scan and VQ scan showed low probability for pulmonary embolus with no perfusion defects -Repeat CMP in a.m and watch creatinine trend carefully    Leg Edema -Per Patient is Chronic but will r/o DVT -Check LE Venous Duplex and left side did not show any blood clot -given another dose of IV Lasix today and will hold further IV Lasix dosing and will resume p.o. dosing -Patient states that she was previously on Lasix prior to her surgery and was taken off in anticipation for surgery but was never restarted.  Asthma, mild intermittent - Continue ICS/LABA - Continue prn albuterol  -Repeat chest x-ray this AM showed "Lung volumes are low. No consolidative airspace disease. No pleural effusions. No evidence of pulmonary edema. No pneumothorax. Heart size is mildly enlarged. Upper mediastinal contours are within normal limits." -We will order a peak flow meter continue with incentive spirometry and flutter valve -Continue guaifenesin    GERD (gastroesophageal reflux disease) - Continue PPI with Pantoprazole 40 mg po BID    Acute on chronic Diastolic CHF (congestive heart failure) Hypoalbuminemia -Last TTE in May showed indeterminate diastolic parameters -Her breathing status is improved and she is off of supplemental oxygen via nasal cannula -She was given IV Lasix the day before yesterday for 40 mg and a p.o. dose of 40 Lasix yesterday.  Today she will get IV Lasix for anasarca -Continue to monitor volume status carefully and IV fluids have now been stopped by nephrology -Strict I's and O's and daily weights -She is -3.837 L since admission -BNP was elevated on admission was 574.8 -Continue to monitor for signs and symptoms of volume overload  Bronchitis, mucopurulent recurrent  - Per chart review from 2015 by Dr. Gwenette Greet: "The patient has no evidence for  bronchiectasis on her high-resolution CT, and there is no evidence for hypogammaglobulinemia. One possibility for her recurrent pulmonary symptoms/infection is that it may be related to her aggressive immunosuppressive regimen. I have asked her to discuss this possibility with her transplant team. Her pulmonary function studies are not overly impressive, but there is the suggestion of subtle airflow obstruction by her flow volume loop. Her history is suggestive of recurrent asthmatic bronchitis, and therefore I would like to try her on a LABA/ICS to see if that makes a difference. I will use an inhaler with a low inhaled steroid dose since she is on low-dose prednisone for her transplant. I would also like to do a scan of her sinuses to make sure that is not the source of her recurrent infections/symptoms. If this is unremarkable, I can really find no source for her recurrent symptoms, and we  need to strongly consider the role that her immunosuppression may be playing." - Curious if this may be another recurrence related to her transplant medications as CXR showed "Shallow inspiration with stable cardiomegaly, mild pulmonary edema and trace bilateral pleural effusion.  Streaky infrahilar consolidation, probably a nonspecific bronchitis. No pneumonia." -See above -Continue with guaifenesin, flutter valve and incentive spirometry -Continue with IV ceftriaxone as above and continue for at least 5 days and transition to p.o. Augmentin when actually being discharged   Mild Hyponatremia -Was 132 on admission and is now 140 -Continue to monitor and trend and repeat CMP in a.m.  Metabolic Acidosis -Mild.  CO2 is now 25, chloride level is 107, and anion gap is 8 -IV fluid hydration is now stopped  -Continue to monitor and trend and repeat CMP in a.m.  Hyperkalemia,, improved -Mild and patient states that her potassium runs around 5 usually  -Patient's potassium was 5.3 is now 3.8 -Status post 2 doses of  IV Lasix 40 mg and will resume p.o. Lasix 40 mg daily -Nephrology recommending to start Lokelma 10 mg p.o. daily if potassium gets over 5.5 -Continue monitor and trend and repeat CMP in a.m.  Normocytic Anemia -Patient's hemoglobin/hematocrit is now stable at 7.7/27.3 -Checked anemia panel showed an iron level of 88, U IBC of 109, TIBC of 197, saturation ratios of 45%, ferritin level 155, folate level 6.4, vitamin B12 795 -Continue to monitor for signs and symptoms of bleeding; currently no overt bleeding note -Repeat CBC in a.m.  Recent Hip Surgery -Patient complaining of Left Hip Pain -Obtain X-Ray showed "Changes consistent with prior left hip replacement. Healing pubic rami fractures on the left are noted. Increased fragmentation of the greater trochanter fragment seen on the prior exam." -We will resume her gabapentin -We will get PT and OT to evaluate and treat and they were initially recommending SNF but patient had refused and recommended home health PT but now patient is more agreeable to the possibility of SNF and OT has now recommended CIR.  CIR admissions coordinator was consulted and I have placed a consult for the rehab MD.  Patient could potentially go to CIR again versus SNF versus home health but discharge disposition will need to be found.  Glaucoma -Continue with home eyedrops of dorzolamide, latanoprost and brimonidine  Hypophosphatemia -Patient's phosphorus level was 2.5 -Replete with p.o. K-Phos neutral 500 mg x 1 yesterday -Continue to monitor and replete as necessary -Repeat phosphorus level in the a.m.  DVT prophylaxis: Enoxaparin 40 mg sq q24h Code Status: FULL CODE  Family Communication: No Family present at bedside  Disposition Plan: Patient is from home and likely can be discharged when she has further clinical improvement.  Her oxygenation is improving and will wean her oxygen prior to discharge however we will also obtain a PT OT to evaluate and treat given  her generalized weakness.  Patient's volume status is improved and she is no longer requiring supplemental oxygen but now because of her weakness is open to the fact on going to a possible SNF.  PT and OT recommending CIR now and rehab admissions coordinator was consulted for further evaluation recommendations.  Patient is currently medically stable to be discharged pending whenever she goes to either rehab versus home with home health versus acute SNF.  Consultants:   Nephrology    Procedures: None   Antimicrobials:  Anti-infectives (From admission, onward)   Start     Dose/Rate Route Frequency Ordered Stop   02/02/20 1000  cefTRIAXone (ROCEPHIN) 2 g in sodium chloride 0.9 % 100 mL IVPB     2 g 200 mL/hr over 30 Minutes Intravenous Every 24 hours 02/02/20 0918     02/01/20 2200  vancomycin (VANCOCIN) IVPB 1000 mg/200 mL premix  Status:  Discontinued     1,000 mg 200 mL/hr over 60 Minutes Intravenous Every 24 hours 01/31/20 2155 02/02/20 0918   01/31/20 2200  ceFEPIme (MAXIPIME) 2 g in sodium chloride 0.9 % 100 mL IVPB  Status:  Discontinued     2 g 200 mL/hr over 30 Minutes Intravenous Every 8 hours 01/31/20 2155 02/02/20 0918   01/31/20 2200  vancomycin (VANCOREADY) IVPB 1750 mg/350 mL     1,750 mg 175 mL/hr over 120 Minutes Intravenous  Once 01/31/20 2155 02/01/20 0238   01/31/20 2145  metroNIDAZOLE (FLAGYL) IVPB 500 mg     500 mg 100 mL/hr over 60 Minutes Intravenous  Once 01/31/20 2141 01/31/20 2308     Subjective: Seen and examined at bedside she was sitting up working with the occupational therapist.  She denies any nausea or vomiting.  States her respiratory status is improved but she was complaining of some back spasms.  No lightheadedness or dizziness.  No other concerns or complaints at this time but is now likely more agreeable to SNF.  Social work was consulted for possible SNF placement and then Occupational Therapy recommended CIR so CIR has been consulted for potential  admission.   Objective: Vitals:   02/04/20 2000 02/04/20 2129 02/05/20 0406 02/05/20 1250  BP:  127/60 (!) 130/58 (!) 122/48  Pulse: 96 94 87 86  Resp: 16 13 20 14   Temp:  98 F (36.7 C) 98.1 F (36.7 C) 97.6 F (36.4 C)  TempSrc:  Oral Oral Oral  SpO2: 98% 94% 95% 100%  Weight:   75.7 kg   Height:        Intake/Output Summary (Last 24 hours) at 02/05/2020 1357 Last data filed at 02/05/2020 0139 Gross per 24 hour  Intake --  Output 600 ml  Net -600 ml   Filed Weights   02/02/20 0511 02/03/20 0357 02/05/20 0406  Weight: 78 kg 77.6 kg 75.7 kg   Examination: Physical Exam:  Constitutional: WN/WD obese Caucasian female currently no acute distress appears calm and slightly uncomfortable complaining of back pain.  She is sitting at the edge of the bed about to work with therapy. Eyes: Lids and conjunctivae normal, sclerae anicteric  ENMT: External Ears, Nose appear normal. Grossly normal hearing.  Neck: Appears normal, supple, no cervical masses, normal ROM, no appreciable thyromegaly; no JVD Respiratory: Diminished to auscultation bilaterally with coarse breath sounds, no wheezing, rales, rhonchi or crackles. Normal respiratory effort and patient is not tachypenic. No accessory muscle use.  Unlabored breathing and not wearing any supplemental oxygen via nasal cannula Cardiovascular: RRR, no murmurs / rubs / gallops. S1 and S2 auscultated.  Has 1+ lower extremity edema slightly worse on the left again compared to the Right  Abdomen: Soft, non-tender, distended secondary body habitus. Bowel sounds positive x4.  GU: Deferred. Musculoskeletal: No clubbing / cyanosis of digits/nails. No joint deformity upper and lower extremities.  Skin: No rashes, lesions, ulcers on limited skin evaluation. No induration; Warm and dry.  Neurologic: CN 2-12 grossly intact with no focal deficits. Romberg sign and cerebellar reflexes not assessed.  Psychiatric: Normal judgment and insight. Alert and  oriented x 3. Normal mood and appropriate affect.   Data Reviewed: I have personally reviewed  following labs and imaging studies  CBC: Recent Labs  Lab 01/31/20 2007 01/31/20 2229 02/01/20 8786 02/02/20 0347 02/03/20 0205 02/04/20 0347 02/05/20 0318  WBC 9.2   < > 9.8 8.1 7.9 5.4 5.7  NEUTROABS 8.1*  --   --  7.2 6.8 4.3 4.5  HGB 8.7*   < > 7.6* 7.4* 7.4* 7.4* 7.7*  HCT 30.3*   < > 27.7* 26.1* 25.6* 26.6* 27.3*  MCV 98.1   < > 98.6 97.4 98.5 98.9 96.8  PLT 293   < > 247 230 203 210 190   < > = values in this interval not displayed.   Basic Metabolic Panel: Recent Labs  Lab 02/01/20 0848 02/02/20 0347 02/03/20 0205 02/04/20 0347 02/05/20 0318  NA 136 141 140 141 140  K 5.1 5.3* 4.6 4.1 3.8  CL 107 111 110 107 107  CO2 21* 21* 20* 25 25  GLUCOSE 173* 134* 109* 112* 115*  BUN 18 25* 26* 25* 24*  CREATININE 0.78 0.91 0.93 0.86 0.93  CALCIUM 8.8* 9.3 9.4 9.4 9.3  MG 2.1 2.2 2.1 1.8 1.9  PHOS 4.5 4.1 3.1 2.4* 2.5   GFR: Estimated Creatinine Clearance: 59.7 mL/min (by C-G formula based on SCr of 0.93 mg/dL). Liver Function Tests: Recent Labs  Lab 02/01/20 0848 02/02/20 0347 02/03/20 0205 02/04/20 0347 02/05/20 0318  AST 13* 12* 17 12* 13*  ALT 8 7 7 8 9   ALKPHOS 130* 111 105 97 114  BILITOT 1.1 0.6 1.0 0.9 0.9  PROT 6.5 6.1* 6.1* 6.1* 6.3*  ALBUMIN 2.3* 2.2* 2.2* 2.2* 2.5*   No results for input(s): LIPASE, AMYLASE in the last 168 hours. No results for input(s): AMMONIA in the last 168 hours. Coagulation Profile: Recent Labs  Lab 01/31/20 2007 02/01/20 7672  INR 1.2 1.4*   Cardiac Enzymes: No results for input(s): CKTOTAL, CKMB, CKMBINDEX, TROPONINI in the last 168 hours. BNP (last 3 results) No results for input(s): PROBNP in the last 8760 hours. HbA1C: No results for input(s): HGBA1C in the last 72 hours. CBG: No results for input(s): GLUCAP in the last 168 hours. Lipid Profile: No results for input(s): CHOL, HDL, LDLCALC, TRIG, CHOLHDL,  LDLDIRECT in the last 72 hours. Thyroid Function Tests: No results for input(s): TSH, T4TOTAL, FREET4, T3FREE, THYROIDAB in the last 72 hours. Anemia Panel: Recent Labs    02/03/20 0205  VITAMINB12 795  FOLATE 6.4  FERRITIN 155  TIBC 197*  IRON 88  RETICCTPCT 2.2   Sepsis Labs: Recent Labs  Lab 01/31/20 2007 02/01/20 0628 02/01/20 0835  PROCALCITON 0.41 0.71  --   LATICACIDVEN 1.1  --  0.8    Recent Results (from the past 240 hour(s))  SARS CORONAVIRUS 2 (TAT 6-24 HRS) Nasopharyngeal Nasopharyngeal Swab     Status: None   Collection Time: 01/31/20 12:54 AM   Specimen: Nasopharyngeal Swab  Result Value Ref Range Status   SARS Coronavirus 2 NEGATIVE NEGATIVE Final    Comment: (NOTE) SARS-CoV-2 target nucleic acids are NOT DETECTED. The SARS-CoV-2 RNA is generally detectable in upper and lower respiratory specimens during the acute phase of infection. Negative results do not preclude SARS-CoV-2 infection, do not rule out co-infections with other pathogens, and should not be used as the sole basis for treatment or other patient management decisions. Negative results must be combined with clinical observations, patient history, and epidemiological information. The expected result is Negative. Fact Sheet for Patients: SugarRoll.be Fact Sheet for Healthcare Providers: https://www.woods-mathews.com/ This test is not  yet approved or cleared by the [place] and  has been authorized for detection and/or diagnosis of SARS-CoV-2 by FDA under an Emergency Use Authorization (EUA). This EUA will remain  in effect (meaning this test can be used) for the duration of the COVID-19 declaration under Section 56 4(b)(1) of the Act, 21 U.S.C. section 360bbb-3(b)(1), unless the authorization is terminated or revoked sooner. Performed at [facility], [place] [address], [place], [place] [address]   Blood Culture (routine x 2)     Status:  None   Collection Time: 01/31/20  8:15 PM   Specimen: BLOOD RIGHT HAND  Result Value Ref Range Status   Specimen Description BLOOD RIGHT HAND  Final   Special Requests   Final    BOTTLES DRAWN AEROBIC AND ANAEROBIC Blood Culture adequate volume   Culture NO GROWTH 5 DAYS  Final   Report Status 02/05/2020 FINAL  Final  Urine culture     Status: Abnormal   Collection Time: 01/31/20  8:15 PM   Specimen: In/Out Cath Urine  Result Value Ref Range Status   Specimen Description IN/OUT CATH URINE  Final   Special Requests   Final    NONE Performed at [facility], [place] [address], [place], [place] [address]    Culture MULTIPLE SPECIES PRESENT, SUGGEST RECOLLECTION (A)  Final   Report Status 02/01/2020 FINAL  Final  Blood Culture (routine x 2)     Status: None (Preliminary result)   Collection Time: 02/01/20  8:58 AM   Specimen: BLOOD RIGHT ARM  Result Value Ref Range Status   Specimen Description BLOOD RIGHT ARM  Final   Special Requests   Final    AEROBIC BOTTLE ONLY Blood Culture results may not be optimal due to an inadequate volume of blood received in culture bottles   Culture NO GROWTH 4 DAYS  Final   Report Status PENDING  Incomplete     RN Pressure Injury Documentation: Pressure Injury 02/01/20 Buttocks Left (Active)  02/01/20 1457  Location: Buttocks  Location Orientation: Left  Staging:   Wound Description (Comments):   Present on Admission: Yes     Estimated body mass index is 30.52 kg/m as calculated from the following:   Height as of this encounter: 5\' 2"  (1.575 m).   Weight as of this encounter: 75.7 kg.  Malnutrition Type:      Malnutrition Characteristics:      Nutrition Interventions:     Radiology Studies: DG CHEST PORT 1 VIEW  Result Date: 02/05/2020 CLINICAL DATA:  64 year old female with history of shortness of breath. EXAM: PORTABLE CHEST 1 VIEW COMPARISON:  Chest x-ray 02/04/2020. FINDINGS: Lung volumes are low. No consolidative  airspace disease. No pleural effusions. No evidence of pulmonary edema. No pneumothorax. Heart size is mildly enlarged. Upper mediastinal contours are within normal limits. IMPRESSION: 1. No radiographic evidence of acute cardiopulmonary disease. 2. Mild cardiomegaly. Electronically Signed   By: Vinnie Langton M.D.   On: 02/05/2020 08:05   DG CHEST PORT 1 VIEW  Result Date: 02/04/2020 CLINICAL DATA:  Respiratory distress.  Short of breath. EXAM: PORTABLE CHEST 1 VIEW COMPARISON:  02/03/2020 and older exams. FINDINGS: Cardiac silhouette normal in size.  No mediastinal or hilar masses. Linear opacities are noted at the lung bases consistent with scarring or atelectasis, stable. Remainder of the lungs is clear. No convincing pleural effusion.  No pneumothorax. Skeletal structures are demineralized but grossly intact. IMPRESSION: 1. No acute cardiopulmonary disease and no  change from the previous day's exam. Electronically Signed   By: Lajean Manes M.D.   On: 02/04/2020 08:37   Scheduled Meds: . allopurinol  300 mg Oral Daily  . aspirin  325 mg Oral Q breakfast  . brimonidine  1 drop Left Eye BID  . docusate sodium  100 mg Oral BID  . dorzolamide  1 drop Both Eyes Daily  . enoxaparin (LOVENOX) injection  40 mg Subcutaneous Daily  . FLUoxetine  40 mg Oral Daily  . furosemide  40 mg Oral Daily  . gabapentin  200 mg Oral TID  . guaiFENesin  1,200 mg Oral BID  . latanoprost  1 drop Both Eyes QHS  . lidocaine  1 patch Transdermal Q24H  . magnesium oxide  400 mg Oral BID  . mouth rinse  15 mL Mouth Rinse BID  . mometasone-formoterol  2 puff Inhalation BID  . mycophenolate  180 mg Oral BID  . pantoprazole  40 mg Oral BID  . predniSONE  7.5 mg Oral Q breakfast  . saccharomyces boulardii  250 mg Oral BID  . sodium chloride flush  3 mL Intravenous Q12H  . tacrolimus  0.5 mg Oral QHS  . tacrolimus  1 mg Oral Daily   Continuous Infusions: . sodium chloride 500 mL (02/02/20 0603)  . cefTRIAXone  (ROCEPHIN)  IV 2 g (02/03/20 1627)    LOS: 5 days   Kerney Elbe, DO [facility] PAGER is on [place]  If 7PM-7AM, please contact night-coverage [email or web address]

## 2020-02-05 NOTE — Discharge Summary (Signed)
Physician Discharge Summary  April [name] OJJ:[id number] DOB: 10-05-1956 DOA: 01/31/2020  PCP: [name], MD  Admit date: 01/31/2020 Discharge date: 02/06/2020  Admitted From: Home Disposition:  Home with Home Health  Recommendations for Outpatient Follow-up:  1. Follow up with PCP in 1-2 weeks 2. Follow up with Pulmonary Dr. Vaughan Browner within 1-2 weeks  3. Follow-up with [facility] and appointment is to be made with Dr. Einar Gip within 1 week 4. Follow up with [facility]  5. Please obtain CMP/CBC, Mag, Phos in one week 6. Please follow up on the following pending results:  Home Health: YES  Equipment/Devices: 3in1 Bedside Commode  Discharge Condition: Stable CODE STATUS: FULL CODE  Diet recommendation: Heart Healthy D[place]   Brief/Interim Summary: HPI per Dr. 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 She reports that her symptoms started this morning. She became more and more short of breath. She has this issue sometimes and takes her home asthma medications, however, she was not able to get the symptoms under control. Usually when she cannot get it under control, she will come to the hospital to take steroids. She was noted by EMS to have saturation to 74% on RA. She improved with medications to 90% on RA and then to 96% with 2LNC. She further had an elevated HR with palpitations, fever to 103F which improved with tylenol and tachypnea. She notes 1 day of productive cough, chills, sneezing, sinus congestion, nausea. She has had no sick contacts and no contacts with coronavirus. She is remaining at home and waiting to get her vaccine. She denied any urinary symptoms, change in urination, chest pain, pain with deep breathing. She does have chronic skin changes, a small area of soreness on her left  buttock and some chronic LE swelling, worse on the left. She notes that she had a hip replacement in January and also had pelvic fractures which have limited her mobility recently. She has no headache or vision changes.   ED Course:In the ED, she was found to be Febrile with an elevated HR and RR with hypoxia. She was noted to have a normal WBC. CXR showed only pulmonary edema, but no obvious pneumonia. She may have had a bronchitis. Blood cultures and urine culture were obtained. She was started on antibiotics for an unknown source in an immunocompromised person. Initial COVID Ag testing is negative  **Interim History She states that she is feeling much better nephrology was consulted for further evaluation recommendations.  Nephrology recommends continuing her current Immunosuppressive medications and they have stopped IV fluids.  Broad-spectrum antibiotics were changed to IV ceftriaxone after discussion with Dr. Graylon Good of infectious diseases.  Dr. Graylon Good was concerned for possible PE however her VQ scan shows very low probability for pulmonary embolus.  Her lower extremity duplex negative for DVT in the left leg.  Was given a dose of IV Lasix yesterday and will be given a dose of p.o. Lasix as she states that she is feeling better.  Will obtain PT OT evaluation prior to discharge for safe discharge disposition.  Nephrology has now signed off the case.  Patient was given another dose of IV Lasix yesterday and and we were anticipating discharging home this a.m. given the patient had initially refused SNF but now OT PT recommending possible CIR and patient is interested in this.  Patient now has  elected to go home with home health services.  This morning she is not feeling as well as she was yesterday but she is stable and not requiring oxygen.  EKG was done and showed sinus tachycardia and electrolytes lab work was also checked.  Her magnesium and phosphorus were low so this will be repleted.   Prior to discharge she had a nonsustained 18 beat run of V. tach which she was asymptomatic and she was started on a low-dose beta-blocker with a total of 25 mg p.o. twice daily and electrolytes were repleted.  She is made a referral for cardiology and currently she is stable to be discharged.  She will need to follow-up with PCP, pulmonology, cardiology as well as new transplant team within 1 to 2 weeks.  She is understandable and agreeable with the plan of care.  Discharge Diagnoses:  Active Problems:   Bronchitis, mucopurulent recurrent (HCC)   Asthma, mild intermittent   GERD (gastroesophageal reflux disease)   Hyperlipidemia   Chronic diastolic CHF (congestive heart failure) (HCC)   Acute respiratory failure with hypoxia (HCC)   Sepsis ([place])  Sepsisin a transplant recipient, source unknown likely it was from her mucopurulent bronchitis, improved Acute respiratory failure with hypoxia in the setting of above versus an acute on chronic diastolic CHF exacerbation or anemia -She currently does not have a leukocytosis anymore and her WBC is now 9.6 - Sources include lung, skin, urinary, implanted material (fistula) unclear what her etiology is -Though this could certainly be COVID related this is highly unlikely, she does not have a CXR which is consistent with viral pneumonia, she does not have lymphopenia and no contacts. She is at high risk for a bacterial infection given her immunosuppresion, and based on chart review, she has had recurrent mucopurulent bronchitis in the past -Initial CXR did show "Shallow inspiration with stable cardiomegaly, mild pulmonary edema and trace bilateral pleural effusion. Streaky infrahilar consolidation, probably a nonspecific bronchitis. No pneumonia." -ABG showed pH of 7.45, PCO2 29.6, PO2 of 187, bicarbonate level of 20.1 and an ABG O2 saturation of 100% -Check PCT 0.41 and slightly worsened to 0.71 -Trend Lactic acid and went from 1.1 is now 0.8 -  Cefepime, metronidazole, vancomycin started given unknown source but will change to IV ceftriaxone given infectious disease recommendations and will continue for now -Blood cultures and urine culture obtained however source is unlikely to be the urine as her urinalysis was clear which showed negative nitrites, negative leukocytes and no bacteria seen - Progressive unit, telemetry - IVF with NS at 100cc/hr now stopped by nephrology -Patient's cortisol level is 8.8 -SpO2: 100 % O2 Flow Rate (L/min): 2 L/min FiO2 (%): 95 %; Now patient has been weaned off of supplemental oxygen via nasal cannula and is saturating well on room air and is feeling stable -We will give another dose of IV Lasix yesterday and will resume p.o. Lasix 40 mg daily today - Oxygen to keep O2 sats > 90% - Continue asthma treatment, hold off on steroids given acute infection -Continue with acetaminophen for antipyretics -Spoke with Dr. Baxter Flattery of infectious diseases who is less concerned about infection and more concerned about possible PE -VQ scan was obtained given patient's renal status and was very low probability for PE.  Her D-dimer is slightly elevated at 1.46 but her lower extremity duplex from the left leg was negative for any VTE -Repeat chest x-ray today showed "Lung volumes are low. No consolidative airspace disease. No pleural effusions. No evidence  of pulmonary edema. No pneumothorax. Heart size is mildly enlarged. Upper mediastinal contours are within normal limits." -Continue to monitor she has been afebrile now and has no leukocytosis however she still remains on 2 L supplemental oxygen -Status post IV Lasix x2 and will resume p.o. Lasix 40 mg daily now - Initial COVID testing negative in ED, follow up PCR testing sent and this was negative as well -Her sepsis physiology is improved significantly as she is now afebrile and is not tachycardic or tachypneic and blood pressure remained stable -We will get PT OT to  evaluate and treat and and they were initially recommending SNF level of care patient has refused initially but now is more open to it and she is now open to going to CIR.  -CIR MD was consulted for inpatient rehab consultation and OT is recommending CIR at this time. -Patient has elected not to go to inpatient rehabilitation and has elected to go home with home health services; will max out her home health services with PT OT aide as well as social worker  Supraventricular Tachycardia status post conversion to normal sinus rhythm  nonsustained V. tach -Heart rates were into the 190s on admission and she was given adenosine with conversion -Continue monitor telemetry in the progressive unit -Continue to monitor on telemetry no recurrence but patient had a 18 beat of V. tach which is asymptomatic per nursing -Replete electrolytes as her magnesium was extremely low at 1.4 -currently she is stable for discharge as troponin initially earlier was 6 and EKG earlier showed sinus tachycardia with a rate of 104 and a QTC of 441 -The patient on metoprolol 12.5 mg p.o. twice daily and made referral to cardiology I spoke with Dr. Einar Gip recommends sending her on 25 mg p.o. twice daily   PCKD s/p transplant - Continue immunosuppressive medications nephrology was consulted and they recommending continuing them - Follow up cultures; she is at risk for fungal infections and concomitant viral infections like JC virus and BK virus. It is reassuring that her renal function is stable.  -Her BUN /creatinine has gone from 18/0.78 -> 25/0.91 -> 26/0.93 -> 25/0.86 -> 24/0.93 -> 20/0.88 -Given IV Lasix 40 mg x1 again yesterday and will start on p.o. Lasix 40 mg -Continue monitor and trend and avoid nephrotoxic medications, contrast dyes, hypotension and renally adjust medications; wanted to order a CTA of the chest to rule out PE however substituted this with the VQ scan and VQ scan showed low probability for pulmonary  embolus with no perfusion defects -Repeat CMP in a.m and watch creatinine trend carefully  Leg Edema -Per Patient is Chronic but will r/o DVT -Check LE Venous Duplex and left side did not show any blood clot -given another dose of IV Lasix today and will hold further IV Lasix dosing and will resume p.o. dosing -Patient states that she was previously on Lasix prior to her surgery and was taken off in anticipation for surgery but was never restarted. -I resumed Lasix now  Asthma, mild intermittent - Continue ICS/LABA - Continue prn albuterol  -Repeat chest x-ray this AM showed "Lung volumes are low. No consolidative airspace disease. No pleural effusions. No evidence of pulmonary edema. No pneumothorax. Heart size is mildly enlarged. Upper mediastinal contours are within normal limits." -We will order a peak flow meter continue with incentive spirometry and flutter valve -Continue guaifenesin 600 mg p.o. twice daily at discharge for 5 days  GERD (gastroesophageal reflux disease) - Continue PPI with Pantoprazole  40 mg po BID  Acute on chronic Diastolic CHF (congestive heart failure) Hypoalbuminemia -Last TTE in May showed indeterminate diastolic parameters -Her breathing status is improved and she is off of supplemental oxygen via nasal cannula -Status post 2 doses of IV Lasix and now is on p.o. Lasix 40 mg daily will need to follow-up in outpatient setting -Continue to monitor volume status carefully and IV fluids have now been stopped by nephrology -Strict I's and O's and daily weights -She is - 4.9935 L since admission -BNP was elevated on admission was 574.8 -Continue to monitor for signs and symptoms of volume overload  Bronchitis, mucopurulent recurrent - Per chart review from 2015 by Dr. Molli Posey patient has no evidence for bronchiectasis on her high-resolution CT, and there is no evidence for hypogammaglobulinemia. One possibility for her recurrent pulmonary  symptoms/infection is that it may be related to her aggressive immunosuppressive regimen. I have asked her to discuss this possibility with her transplant team. Her pulmonary function studies are not overly impressive, but there is the suggestion of subtle airflow obstruction by her flow volume loop. Her history is suggestive of recurrent asthmatic bronchitis, and therefore I would like to try her on a LABA/ICS to see if that makes a difference. I will use an inhaler with a low inhaled steroid dose since she is on low-dose prednisone for her transplant. I would also like to do a scan of her sinuses to make sure that is not the source of her recurrent infections/symptoms. If this is unremarkable, I can really find no source for her recurrent symptoms, and we need to strongly consider the role that her immunosuppression may be playing." - Curious if this may be another recurrence related to her transplant medications as CXR showed "Shallow inspiration with stable cardiomegaly, mild pulmonary edema and trace bilateral pleural effusion.  Streaky infrahilar consolidation, probably a nonspecific bronchitis. No pneumonia." -See above -Continue with guaifenesin, flutter valve and incentive spirometry -Continue with IV ceftriaxone as above and continue for at least 5 days and transition to p.o. Augmentin for another 5 days when actually being discharged   Mild Hyponatremia -Was 132 on admission and is now 140 -Continue to monitor and trend and repeat CMP within 1 week  Metabolic Acidosis -Mild.  CO2 is now 22, chloride level is 107, and anion gap is 11 -IV fluid hydration is now stopped  -Continue to monitor and trend and repeat CMP within 1 week  Hyperkalemia,, improved -Mild and patient states that her potassium runs around 5 usually  -Patient's potassium was 5.3 is now 4.0 -Status post 2 doses of IV Lasix 40 mg and will resume p.o. Lasix 40 mg daily at discharge -Nephrology recommending to start  Lokelma 10 mg p.o. daily if potassium gets over 5.5 -Continue monitor and trend and repeat CMP in a.m.  Normocytic Anemia -Patient's hemoglobin/hematocrit is now stable at 8.2/28.8 -Checked anemia panel showed an iron level of 88, U IBC of 109, TIBC of 197, saturation ratios of 45%, ferritin level 155, folate level 6.4, vitamin B12 795 -Continue to monitor for signs and symptoms of bleeding; currently no overt bleeding note -Repeat CBC in a.m.  Recent Hip Surgery -Patient complaining of Left Hip Pain -Obtain X-Ray showed "Changes consistent with prior left hip replacement. Healing pubic rami fractures on the left are noted. Increased fragmentation of the greater trochanter fragment seen on the prior exam." -We will resume her gabapentin -We will get PT and OT to evaluate and treat and they  were initially recommending SNF but patient had refused and recommended home health PT but now patient is more agreeable to the possibility of SNF and OT has now recommended CIR.  CIR admissions coordinator was consulted and I have placed a consult for the rehab MD.  Patient could potentially go to CIR again versus SNF versus home health but after all this the patient has elected to go home with home health and not to go to CIR given her immunocompromise status  Glaucoma -Continue with home eyedrops of dorzolamide, latanoprost and brimonidine  Hypophosphatemia -Patient's phosphorus level was 1.9 -Replete with p.o. K-Phos neutral 500 mg p.o. twice daily x2 doses -Continue to monitor and replete as necessary -Repeat phosphorus level within 1 week  Hypomagnesemia -Patient magnesium level was 1.4 -replete with IV mag sulfate 3 g -Continue home magnesium oxide 400 mg p.o. twice daily next-continue monitor and replete as necessary -Repeat magnesium in the outpatient setting  Discharge Instructions Discharge Instructions    ([place]) Call MD:  Anytime you have any of the following  symptoms: 1) 3 pound weight gain in 24 hours or 5 pounds in 1 week 2) shortness of breath, with or without a dry hacking cough 3) swelling in the hands, feet or stomach 4) if you have to sleep on extra pillows at night in order to breathe.   Complete by: As directed    Call MD for:  difficulty breathing, headache or visual disturbances   Complete by: As directed    Call MD for:  extreme fatigue   Complete by: As directed    Call MD for:  hives   Complete by: As directed    Call MD for:  persistant dizziness or light-headedness   Complete by: As directed    Call MD for:  persistant nausea and vomiting   Complete by: As directed    Call MD for:  redness, tenderness, or signs of infection (pain, swelling, redness, odor or green/yellow discharge around incision site)   Complete by: As directed    Call MD for:  severe uncontrolled pain   Complete by: As directed    Call MD for:  temperature >100.4   Complete by: As directed    Diet - low sodium heart healthy   Complete by: As directed    Discharge instructions   Complete by: As directed    You were cared for by a hospitalist during your hospital stay. If you have any questions about your discharge medications or the care you received while you were in the hospital after you are discharged, you can call the unit and ask to speak with the hospitalist on call if the hospitalist that took care of you is not available. Once you are discharged, your primary care physician will handle any further medical issues. Please note that NO REFILLS for any discharge medications will be authorized once you are discharged, as it is imperative that you return to your primary care physician (or establish a relationship with a primary care physician if you do not have one) for your aftercare needs so that they can reassess your need for medications and monitor your lab values.  Follow up with PCP and Transplant team and Nephrology in the outpatient setting. Take all  medications as prescribed. If symptoms change or worsen please return to the ED for evaluation   Increase activity slowly   Complete by: As directed      Allergies as of 02/06/2020  Reactions   Infed [iron Dextran] Other (See Comments)   Chest tightness   Pentamidine Itching, Shortness Of Breath, Swelling   Erythromycin [erythromycin] Other (See Comments)   Mouth Ulcers   Iohexol Other (See Comments)   Per patient "she has had a kidney transplant and should never have contrast"   Oxycodone Nausea Only, Nausea And Vomiting   Erythromycin Rash   Causes breakout in mouth   Ultram [tramadol Hcl] Anxiety      Medication List    TAKE these medications   albuterol 108 (90 Base) MCG/ACT inhaler Commonly known as: VENTOLIN HFA Inhale 2 puffs into the lungs every 4 (four) hours as needed for wheezing or shortness of breath.   allopurinol 300 MG tablet Commonly known as: ZYLOPRIM Take 1 tablet (300 mg total) by mouth daily.   aspirin 81 MG chewable tablet Chew 81 mg by mouth daily.   brimonidine 0.1 % Soln Commonly known as: ALPHAGAN P Place 1 drop 2 (two) times daily into the left eye.   calcium carbonate 500 MG chewable tablet Commonly known as: TUMS - dosed in mg elemental calcium Chew 2 tablets (400 mg of elemental calcium total) by mouth 4 (four) times daily as needed for indigestion or heartburn.   cholecalciferol 25 MCG (1000 UNIT) tablet Commonly known as: VITAMIN D3 Take 1 tablet (1,000 Units total) by mouth daily.   cycloSPORINE 0.05 % ophthalmic emulsion Commonly known as: RESTASIS Place 1 drop into both eyes 2 (two) times daily.   docusate sodium 100 MG capsule Commonly known as: COLACE Take 1 capsule (100 mg total) by mouth 2 (two) times daily. What changed:   when to take this  reasons to take this   dorzolamide 2 % ophthalmic solution Commonly known as: TRUSOPT Place 1 drop into both eyes daily.   FLUoxetine 40 MG capsule Commonly known as:  PROZAC Take 1 capsule (40 mg total) by mouth daily.   furosemide 40 MG tablet Commonly known as: LASIX Take 1 tablet (40 mg total) by mouth daily. Start taking on: February 07, 2020   gabapentin 100 MG capsule Commonly known as: NEURONTIN Take 200 mg by mouth 3 (three) times daily.   guaiFENesin 600 MG 12 hr tablet Commonly known as: MUCINEX Take 2 tablets (1,200 mg total) by mouth 2 (two) times daily.   HYDROcodone-acetaminophen 5-325 MG tablet Commonly known as: NORCO/VICODIN Take 1 tablet by mouth every 4 (four) hours as needed for severe pain.   lidocaine 5 % Commonly known as: LIDODERM Place 1 patch onto the skin daily. Remove & Discard patch within 12 hours or as directed by MD Start taking on: February 07, 2020   magnesium oxide 400 MG tablet Commonly known as: MAG-OX Take 1 tablet (400 mg total) by mouth 2 (two) times daily.   methocarbamol 500 MG tablet Commonly known as: ROBAXIN Take 1 tablet (500 mg total) by mouth 4 (four) times daily.   mycophenolate 180 MG EC tablet Commonly known as: MYFORTIC Take 180 mg by mouth 2 (two) times daily.   pantoprazole 40 MG tablet Commonly known as: PROTONIX Take 1 tablet (40 mg total) by mouth 2 (two) times daily.   polyethylene glycol 17 g packet Commonly known as: MIRALAX / GLYCOLAX Take 17 g by mouth daily as needed for mild constipation.   predniSONE 5 MG tablet Commonly known as: DELTASONE Take 2.5-5 mg by mouth See admin instructions. 5 mg in the morning, 2.5 mg in the evening   saccharomyces boulardii  250 MG capsule Commonly known as: FLORASTOR Take 1 capsule (250 mg total) by mouth 2 (two) times daily.   sodium chloride 0.65 % Soln nasal spray Commonly known as: OCEAN Place 1 spray into both nostrils as needed for congestion.   Symbicort 160-4.5 MCG/ACT inhaler Generic drug: budesonide-formoterol Inhale 2 puffs into the lungs 2 (two) times daily.   tacrolimus 0.5 MG capsule Commonly known as: PROGRAF Take  0.5 mg by mouth at bedtime. Takes brand name Prograf   Prograf 1 MG capsule Generic drug: tacrolimus Take 1 mg by mouth daily.   Travatan Z 0.004 % Soln ophthalmic solution Generic drug: Travoprost (BAK Free) Place 1 drop into both eyes at bedtime.   vitamin B-12 100 MCG tablet Commonly known as: CYANOCOBALAMIN Take 100 mcg by mouth daily.       Allergies  Allergen Reactions  . Infed [Iron Dextran] Other (See Comments)    Chest tightness  . Pentamidine Itching, Shortness Of Breath and Swelling  . Erythromycin [Erythromycin] Other (See Comments)    Mouth Ulcers  . Iohexol Other (See Comments)    Per patient "she has had a kidney transplant and should never have contrast"  . Oxycodone Nausea Only and Nausea And Vomiting  . Erythromycin Rash    Causes breakout in mouth  . Ultram [Tramadol Hcl] Anxiety    Consultations:  Nephrology  Discussed the case with ID Dr. Baxter Flattery  Discussed case with Dr. Einar Gip  Procedures/Studies: DG Chest 2 View  Result Date: 02/02/2020 CLINICAL DATA:  Dyspnea. EXAM: CHEST - 2 VIEW COMPARISON:  January 31, 2020. FINDINGS: Stable cardiomediastinal silhouette. No pneumothorax or pleural effusion is noted. Left lung is clear. Minimal right basilar subsegmental atelectasis is noted. The visualized skeletal structures are unremarkable. IMPRESSION: Minimal right basilar subsegmental atelectasis. Electronically Signed   By: Marijo Conception M.D.   On: 02/02/2020 14:25   NM Pulmonary Perfusion  Result Date: 02/02/2020 CLINICAL DATA:  Shortness of breath EXAM: NUCLEAR MEDICINE PERFUSION LUNG SCAN TECHNIQUE: Perfusion images were obtained in multiple projections after intravenous injection of radiopharmaceutical. Ventilation scans intentionally deferred if perfusion scan and chest x-ray adequate for interpretation during COVID 19 epidemic. Views: Anterior, posterior, left lateral, right lateral, RPO, LPO, RAO, LAO RADIOPHARMACEUTICALS:  1.55 mCi Tc-44m MAA IV  COMPARISON:  Chest radiograph January 31, 2020 FINDINGS: Radiotracer uptake bilaterally is homogeneous and symmetric. No focal perfusion defects evident. IMPRESSION: No perfusion defects evident. Very low probability of pulmonary embolus. Electronically Signed   By: Lowella Grip III M.D.   On: 02/02/2020 13:34   NM PULMONARY VENT AND PERF (V/Q Scan)  Result Date: 01/07/2020 CLINICAL DATA:  Shortness of breath history of left hip replacement EXAM: NUCLEAR MEDICINE VENTILATION - PERFUSION LUNG SCAN TECHNIQUE: Ventilation images were obtained in multiple projections using inhaled aerosol Tc-33m DTPA. Perfusion images were obtained in multiple projections after intravenous injection of Tc-68m MAA. RADIOPHARMACEUTICALS:  36 mCi of Tc-74m DTPA aerosol inhalation and 1.5 mCi Tc26m MAA IV COMPARISON:  chest x-ray 01/07/2020, V/Q scan 10/04/2018 FINDINGS: Ventilation: No focal ventilation defect. Overall heterogeneous ventilation. Perfusion: No wedge shaped peripheral perfusion defects to suggest acute pulmonary embolism. Overall heterogeneous perfusion. IMPRESSION: Overall heterogeneous perfusion, consistent with low probability study for PE. Electronically Signed   By: Donavan Foil M.D.   On: 01/07/2020 22:28   DG CHEST PORT 1 VIEW  Result Date: 02/05/2020 CLINICAL DATA:  64 year old female with history of shortness of breath. EXAM: PORTABLE CHEST 1 VIEW COMPARISON:  Chest x-ray 02/04/2020.  FINDINGS: Lung volumes are low. No consolidative airspace disease. No pleural effusions. No evidence of pulmonary edema. No pneumothorax. Heart size is mildly enlarged. Upper mediastinal contours are within normal limits. IMPRESSION: 1. No radiographic evidence of acute cardiopulmonary disease. 2. Mild cardiomegaly. Electronically Signed   By: Vinnie Langton M.D.   On: 02/05/2020 08:05   DG CHEST PORT 1 VIEW  Result Date: 02/04/2020 CLINICAL DATA:  Respiratory distress.  Short of breath. EXAM: PORTABLE CHEST 1 VIEW  COMPARISON:  02/03/2020 and older exams. FINDINGS: Cardiac silhouette normal in size.  No mediastinal or hilar masses. Linear opacities are noted at the lung bases consistent with scarring or atelectasis, stable. Remainder of the lungs is clear. No convincing pleural effusion.  No pneumothorax. Skeletal structures are demineralized but grossly intact. IMPRESSION: 1. No acute cardiopulmonary disease and no change from the previous day's exam. Electronically Signed   By: Lajean Manes M.D.   On: 02/04/2020 08:37   DG CHEST PORT 1 VIEW  Result Date: 02/03/2020 CLINICAL DATA:  Shortness of breath EXAM: PORTABLE CHEST 1 VIEW COMPARISON:  02/02/2020 FINDINGS: Mild right basilar opacity, likely atelectasis. Lungs otherwise clear. No pleural effusion or pneumothorax. Mild cardiomegaly. IMPRESSION: No evidence of acute cardiopulmonary disease. Electronically Signed   By: Julian Hy M.D.   On: 02/03/2020 09:21   DG Chest Port 1 View  Result Date: 01/31/2020 CLINICAL DATA:  Dyspnea. EXAM: PORTABLE CHEST 1 VIEW COMPARISON:  January 07, 2020. FINDINGS: Inspiration remains shallow. There is stable cardiomegaly with central pulmonary vascular congestion, trace left pleural effusion and mild pulmonary edema. Telemetry leads preclude evaluation of the right lateral costophrenic angle for any residual right pleural effusion. Trace fluid continues to layer along the minor fissure. Bilateral infrahilar streaky consolidation probably represents bronchitis. There is no convincing evidence for pneumonia. There is no pneumothorax. Diffuse bone demineralization and mild skeletal degenerative changes are present. There are telemetry leads. IMPRESSION: Shallow inspiration with stable cardiomegaly, mild pulmonary edema and trace bilateral pleural effusion. Streaky infrahilar consolidation, probably a nonspecific bronchitis. No pneumonia. Electronically Signed   By: Revonda Humphrey   On: 01/31/2020 21:17   DG Chest Port 1  View  Result Date: 01/07/2020 CLINICAL DATA:  Shortness of breath for 2 days EXAM: PORTABLE CHEST 1 VIEW COMPARISON:  12/09/2019 FINDINGS: Cardiac shadow is mildly enlarged but stable. The lungs are well aerated bilaterally. Minimal blunting of the costophrenic angles is noted bilaterally. No acute abnormality is noted. IMPRESSION: No acute abnormality seen. Electronically Signed   By: Inez Catalina M.D.   On: 01/07/2020 17:10   DG HIP UNILAT WITH PELVIS 2-3 VIEWS LEFT  Result Date: 02/01/2020 CLINICAL DATA:  Left hip pain for 1 day, no known injury, initial encounter EXAM: DG HIP (WITH OR WITHOUT PELVIS) 3V LEFT COMPARISON:  12/21/2019 FINDINGS: Left hip replacement is again seen and stable. The greater trochanter fracture fragment is again seen and slightly displaced when compare with the prior exam. Prior left pubic rami fractures are again noted. Callus formation in these fractures. No soft tissue abnormality is seen. IMPRESSION: Changes consistent with prior left hip replacement. Healing pubic rami fractures on the left are noted. Increased fragmentation of the greater trochanter fragment seen on the prior exam. Electronically Signed   By: Inez Catalina M.D.   On: 02/01/2020 21:41   VAS [place] LOWER EXTREMITY VENOUS (DVT)  Result Date: 02/03/2020  Lower Venous DVTStudy Indications: Edema.  Limitations: Poor ultrasound/tissue interface. Comparison Study: 12/16/2019-negative lower extremity venous duplex Performing  Technologist: Maudry Mayhew MHA, RDMS, RVT, RDCS  Examination Guidelines: A complete evaluation includes B-mode imaging, spectral Doppler, color Doppler, and power Doppler as needed of all accessible portions of each vessel. Bilateral testing is considered an integral part of a complete examination. Limited examinations for reoccurring indications may be performed as noted. The reflux portion of the exam is performed with the patient in reverse Trendelenburg.   +---------+---------------+---------+-----------+----------+--------------+ LEFT     CompressibilityPhasicitySpontaneityPropertiesThrombus Aging +---------+---------------+---------+-----------+----------+--------------+ CFV      Full           Yes      Yes                                 +---------+---------------+---------+-----------+----------+--------------+ SFJ      Full                                                        +---------+---------------+---------+-----------+----------+--------------+ FV Prox  Full                                                        +---------+---------------+---------+-----------+----------+--------------+ FV Mid   Full                                                        +---------+---------------+---------+-----------+----------+--------------+ FV DistalFull                                                        +---------+---------------+---------+-----------+----------+--------------+ PFV      Full                                                        +---------+---------------+---------+-----------+----------+--------------+ POP      Full           Yes      Yes                                 +---------+---------------+---------+-----------+----------+--------------+ PTV      Full                                                        +---------+---------------+---------+-----------+----------+--------------+ PERO     Full                                                        +---------+---------------+---------+-----------+----------+--------------+  Summary: LEFT: - There is no evidence of deep vein thrombosis in the lower extremity.  - No cystic structure found in the popliteal fossa.  *See table(s) above for measurements and observations. Electronically signed by Harold Barban MD on 02/03/2020 at 7:08:44 PM.    Final     Subjective: Seen and examined at bedside states that she is not  feeling as well as she did yesterday and complained of significant pain in her hip specifically her left hip and states a little worse than normal.  States it was a 4 or 5 out of 10 but normally is a 2 or 3 out of 10.  No chest pain, lightheadedness or dizziness.  Shortness breath is stable and she feels better from a respiratory standpoint.  Just feels weak.  Has elected not to go to CIR and wants to go home with home health services.  Prior to discharge she had a nonsustained 18 beat run of V. tach and she is started on beta-blocker and cardiology referral is made.  She is stable for discharge and she will need to follow-up with PCP and pulmonary, cardiology as well as do transplant team in the outpatient setting and she will be continued on antibiotics for least 5 more days.  Discharge Exam: Vitals:   02/06/20 1100 02/06/20 1129  BP:    Pulse: (!) 102 94  Resp: 17 19  Temp:  98.4 F (36.9 C)  SpO2: 96% 97%   Vitals:   02/06/20 0850 02/06/20 1000 02/06/20 1100 02/06/20 1129  BP: (!) 119/56     Pulse: (!) 101  (!) 102 94  Resp:  19 17 19   Temp: 99.5 F (37.5 C)   98.4 F (36.9 C)  TempSrc: Axillary   Oral  SpO2: 97%  96% 97%  Weight:      Height:       General: Pt is alert, awake, not in acute distress but appears fatigued Cardiovascular: Sinus rhythm with slightly tachycardic, S1/S2 +, no rubs, no gallops Respiratory: Diminished bilaterally, no wheezing, no rhonchi; has unlabored breathing and not wearing any supplemental oxygen via nasal cannula Abdominal: Soft, NT, distended secondary to body habitus, bowel sounds + Extremities: 1+ extremity edema bilaterally with worsening on the left compared to right, no cyanosis  The results of significant diagnostics from this hospitalization (including imaging, microbiology, ancillary and laboratory) are listed below for reference.    Microbiology: Recent Results (from the past 240 hour(s))  SARS CORONAVIRUS 2 (TAT 6-24 HRS)  Nasopharyngeal Nasopharyngeal Swab     Status: None   Collection Time: 01/31/20 12:54 AM   Specimen: Nasopharyngeal Swab  Result Value Ref Range Status   SARS Coronavirus 2 NEGATIVE NEGATIVE Final    Comment: (NOTE) SARS-CoV-2 target nucleic acids are NOT DETECTED. The SARS-CoV-2 RNA is generally detectable in upper and lower respiratory specimens during the acute phase of infection. Negative results do not preclude SARS-CoV-2 infection, do not rule out co-infections with other pathogens, and should not be used as the sole basis for treatment or other patient management decisions. Negative results must be combined with clinical observations, patient history, and epidemiological information. The expected result is Negative. Fact Sheet for Patients: SugarRoll.be Fact Sheet for Healthcare Providers: https://www.woods-mathews.com/ This test is not yet approved or cleared by the [place] FDA and  has been authorized for detection and/or diagnosis of SARS-CoV-2 by FDA under an Emergency Use Authorization (EUA). This EUA will remain  in effect (meaning this test can be used)  for the duration of the COVID-19 declaration under Section 56 4(b)(1) of the Act, 21 U.S.C. section 360bbb-3(b)(1), unless the authorization is terminated or revoked sooner. Performed at [facility], [place] [address] [place], [place] [address]   Blood Culture (routine x 2)     Status: None   Collection Time: 01/31/20  8:15 PM   Specimen: BLOOD RIGHT HAND  Result Value Ref Range Status   Specimen Description BLOOD RIGHT HAND  Final   Special Requests   Final    BOTTLES DRAWN AEROBIC AND ANAEROBIC Blood Culture adequate volume   Culture NO GROWTH 5 DAYS  Final   Report Status 02/05/2020 FINAL  Final  Urine culture     Status: Abnormal   Collection Time: 01/31/20  8:15 PM   Specimen: In/Out Cath Urine  Result Value Ref Range Status   Specimen Description IN/OUT  CATH URINE  Final   Special Requests   Final    NONE Performed at [facility], [place] [address] [place], [place] [address]    Culture MULTIPLE SPECIES PRESENT, SUGGEST RECOLLECTION (A)  Final   Report Status 02/01/2020 FINAL  Final  Blood Culture (routine x 2)     Status: None   Collection Time: 02/01/20  8:58 AM   Specimen: BLOOD RIGHT ARM  Result Value Ref Range Status   Specimen Description BLOOD RIGHT ARM  Final   Special Requests   Final    AEROBIC BOTTLE ONLY Blood Culture results may not be optimal due to an inadequate volume of blood received in culture bottles   Culture   Final    NO GROWTH 5 DAYS Performed at [facility], [place] [address] [place], [place] [address]    Report Status 02/06/2020 FINAL  Final    Labs: BNP (last 3 results) Recent Labs    06/29/19 1419 01/07/20 1629 01/31/20 2007  BNP 224.1* 535.4* 458.0*   Basic Metabolic Panel: Recent Labs  Lab 02/02/20 0347 02/03/20 0205 02/04/20 0347 02/05/20 0318 02/06/20 1026  NA 141 140 141 140 140  K 5.3* 4.6 4.1 3.8 4.0  CL 111 110 107 107 107  CO2 21* 20* 25 25 22   GLUCOSE 134* 109* 112* 115* 185*  BUN 25* 26* 25* 24* 20  CREATININE 0.91 0.93 0.86 0.93 0.88  CALCIUM 9.3 9.4 9.4 9.3 9.3  MG 2.2 2.1 1.8 1.9 1.4*  PHOS 4.1 3.1 2.4* 2.5 1.9*   Liver Function Tests: Recent Labs  Lab 02/02/20 0347 02/03/20 0205 02/04/20 0347 02/05/20 0318 02/06/20 1026  AST 12* 17 12* 13* 16  ALT 7 7 8 9 9   ALKPHOS 111 105 97 114 118  BILITOT 0.6 1.0 0.9 0.9 1.1  PROT 6.1* 6.1* 6.1* 6.3* 6.7  ALBUMIN 2.2* 2.2* 2.2* 2.5* 2.6*   No results for input(s): LIPASE, AMYLASE in the last 168 hours. No results for input(s): AMMONIA in the last 168 hours. CBC: Recent Labs  Lab 02/02/20 0347 02/03/20 0205 02/04/20 0347 02/05/20 0318 02/06/20 1026  WBC 8.1 7.9 5.4 5.7 9.6  NEUTROABS 7.2 6.8 4.3 4.5 8.5*  HGB 7.4* 7.4* 7.4* 7.7* 8.2*  HCT 26.1* 25.6* 26.6* 27.3* 28.8*  MCV 97.4 98.5 98.9 96.8 98.3   PLT 230 203 210 190 211   Cardiac Enzymes: No results for input(s): CKTOTAL, CKMB, CKMBINDEX, TROPONINI in the last 168 hours. BNP: Invalid input(s): POCBNP CBG: No results for input(s): GLUCAP in the last 168 hours. D-Dimer No results for input(s): DDIMER  in the last 72 hours. Hgb A1c No results for input(s): HGBA1C in the last 72 hours. Lipid Profile No results for input(s): CHOL, HDL, LDLCALC, TRIG, CHOLHDL, LDLDIRECT in the last 72 hours. Thyroid function studies No results for input(s): TSH, T4TOTAL, T3FREE, THYROIDAB in the last 72 hours.  Invalid input(s): FREET3 Anemia work up No results for input(s): VITAMINB12, FOLATE, FERRITIN, TIBC, IRON, RETICCTPCT in the last 72 hours. Urinalysis    Component Value Date/Time   COLORURINE YELLOW 01/31/2020 2015   APPEARANCEUR CLEAR 01/31/2020 2015   LABSPEC 1.016 01/31/2020 2015   PHURINE 5.0 01/31/2020 2015   GLUCOSEU NEGATIVE 01/31/2020 2015   HGBUR MODERATE (A) 01/31/2020 2015   BILIRUBINUR NEGATIVE 01/31/2020 2015   [place] 01/31/2020 2015   PROTEINUR 30 (A) 01/31/2020 2015   UROBILINOGEN 0.2 05/15/2013 0733   NITRITE NEGATIVE 01/31/2020 2015   LEUKOCYTESUR NEGATIVE 01/31/2020 2015   Sepsis Labs Invalid input(s): PROCALCITONIN,  WBC,  LACTICIDVEN Microbiology Recent Results (from the past 240 hour(s))  SARS CORONAVIRUS 2 (TAT 6-24 HRS) Nasopharyngeal Nasopharyngeal Swab     Status: None   Collection Time: 01/31/20 12:54 AM   Specimen: Nasopharyngeal Swab  Result Value Ref Range Status   SARS Coronavirus 2 NEGATIVE NEGATIVE Final    Comment: (NOTE) SARS-CoV-2 target nucleic acids are NOT DETECTED. The SARS-CoV-2 RNA is generally detectable in upper and lower respiratory specimens during the acute phase of infection. Negative results do not preclude SARS-CoV-2 infection, do not rule out co-infections with other pathogens, and should not be used as the sole basis for treatment or other patient management  decisions. Negative results must be combined with clinical observations, patient history, and epidemiological information. The expected result is Negative. Fact Sheet for Patients: SugarRoll.be Fact Sheet for Healthcare Providers: https://www.woods-mathews.com/ This test is not yet approved or cleared by the [place] FDA and  has been authorized for detection and/or diagnosis of SARS-CoV-2 by FDA under an Emergency Use Authorization (EUA). This EUA will remain  in effect (meaning this test can be used) for the duration of the COVID-19 declaration under Section 56 4(b)(1) of the Act, 21 U.S.C. section 360bbb-3(b)(1), unless the authorization is terminated or revoked sooner. Performed at [facility], [place] [address] [place], [place] [address]   Blood Culture (routine x 2)     Status: None   Collection Time: 01/31/20  8:15 PM   Specimen: BLOOD RIGHT HAND  Result Value Ref Range Status   Specimen Description BLOOD RIGHT HAND  Final   Special Requests   Final    BOTTLES DRAWN AEROBIC AND ANAEROBIC Blood Culture adequate volume   Culture NO GROWTH 5 DAYS  Final   Report Status 02/05/2020 FINAL  Final  Urine culture     Status: Abnormal   Collection Time: 01/31/20  8:15 PM   Specimen: In/Out Cath Urine  Result Value Ref Range Status   Specimen Description IN/OUT CATH URINE  Final   Special Requests   Final    NONE Performed at [facility], [place] [address], [place], [place] [address]    Culture MULTIPLE SPECIES PRESENT, SUGGEST RECOLLECTION (A)  Final   Report Status 02/01/2020 FINAL  Final  Blood Culture (routine x 2)     Status: None   Collection Time: 02/01/20  8:58 AM   Specimen: BLOOD RIGHT ARM  Result Value Ref Range Status   Specimen Description BLOOD RIGHT ARM  Final   Special Requests   Final  AEROBIC BOTTLE ONLY Blood Culture results may not be optimal due to an inadequate volume of blood received in culture  bottles   Culture   Final    NO GROWTH 5 DAYS Performed at [place] [address], [place], [place] [address]    Report Status 02/06/2020 FINAL  Final   Time coordinating discharge: 35 minutes  SIGNED:  Kerney Elbe, DO [facility] 02/06/2020, 3:10 PM Pager is on [place]  If 7PM-7AM, please contact night-coverage [email or web address] Password [email or web address]

## 2020-02-05 NOTE — Evaluation (Signed)
Occupational Therapy Evaluation Patient Details Name: April [name] MRN: [id number] DOB: 1956-02-23 Today's Date: 02/05/2020    History of Present Illness 64 y.o. 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 Recent hip and pelvic fxs in January.   Clinical Impression   Patient admitted for above and limited by problem list below, including pain, generalized weakness, decreased activity tolerance, and impaired balance.  Patient reports PTA requiring increased assist, completing ADLS and toileting from bed level with assist, limited mobility. Patient currently requires min guard for bed mobility, min assist for UB ADLs and max-total assist for LB ADLs, completed lateral scoot transfers with mod assist to recliner today.  She reports having 24/7 assist at discharge, is highly motivated to improve mobility and independence with ADLs. Discussed recommendations for rehab, and patient is interested in CIR level again. Believe she will progress well and best benefit from CIR to optimize independence and safety, decrease burden of care before dc home.  Will follow acutely.     Follow Up Recommendations  Other (comment);Supervision/Assistance - 24 hour(post acute rehab CIR vs SNF; if pt declines HHOT and aide )    Equipment Recommendations  3 in 1 bedside commode(DROP ARM BSC )    Recommendations for Other Services Rehab consult     Precautions / Restrictions Precautions Precautions: Fall Precaution Comments: L wrist brace for comfort Restrictions Weight Bearing Restrictions: No LUE Weight Bearing: Weight bearing as tolerated LLE Weight Bearing: Weight bearing as tolerated      Mobility Bed Mobility Overal bed mobility: Needs Assistance Bed Mobility: Supine to Sit     Supine to sit: HOB elevated;Min guard     General bed mobility comments: increased time and effort,  using bed rails but no phyiscal assist required  Transfers Overall transfer level: Needs assistance   Transfers: Lateral/Scoot Transfers          Lateral/Scoot Transfers: Mod assist General transfer comment: lateral scoot towards R side into recliner with mod assist given cueing for technique and hand placement     Balance Overall balance assessment: Needs assistance Sitting-balance support: No upper extremity supported;Feet supported Sitting balance-Leahy Scale: Fair Sitting balance - Comments: statically, min guard dynamically                                    ADL either performed or assessed with clinical judgement   ADL Overall ADL's : Needs assistance/impaired     Grooming: Set up;Sitting   Upper Body Bathing: Minimal assistance;Sitting   Lower Body Bathing: Maximal assistance;Sitting/lateral leans   Upper Body Dressing : Minimal assistance;Sitting   Lower Body Dressing: Total assistance;Sitting/lateral leans   Toilet Transfer: Moderate assistance Toilet Transfer Details (indicate cue type and reason): lateral scoot towards R side into recliner          Functional mobility during ADLs: Moderate assistance General ADL Comments: pt limited by pain, generalized weakness and decreased activity tolerance      Vision         Perception     Praxis      Pertinent Vitals/Pain Pain Assessment: Faces Faces Pain Scale: Hurts little more Pain Location: L hip, low back Pain Descriptors / Indicators: Discomfort;Aching;Spasm Pain Intervention(s): Limited activity within patient's tolerance;Monitored during session;Repositioned     Hand Dominance Right   Extremity/Trunk Assessment Upper Extremity  Assessment Upper Extremity Assessment: Generalized weakness;LUE deficits/detail LUE Deficits / Details: recent wrist fx, weakness and guarded, impaired sensation reporting numbness in fingers (thumb > 5th digit) LUE Sensation: decreased light touch LUE  Coordination: WNL   Lower Extremity Assessment Lower Extremity Assessment: Defer to PT evaluation       Communication Communication Communication: No difficulties   Cognition Arousal/Alertness: Awake/alert Behavior During Therapy: WFL for tasks assessed/performed Overall Cognitive Status: Within Functional Limits for tasks assessed                                     General Comments  VSS on RA     Exercises     Shoulder Instructions      Home Living Family/patient expects to be discharged to:: Private residence Living Arrangements: Spouse/significant other;Children Available Help at Discharge: Family;Available 24 hours/day Type of Home: House Home Access: Ramped entrance     Home Layout: Two level;Able to live on main level with bedroom/bathroom Alternate Level Stairs-Number of Steps: 5   Bathroom Shower/Tub: Occupational psychologist: Standard     Home Equipment: Environmental consultant - 2 wheels;Wheelchair - [employer];Shower seat;Adaptive equipment;Hospital bed Adaptive Equipment: Reacher;Sock aid        Prior Functioning/Environment Level of Independence: Independent with assistive device(s)        Comments: independent with RW prior to fall, since CIR stay has been mostly in bed, working with HHPT on standing and strengthening; bed level ADLS with assist         OT Problem List: Decreased strength;Decreased activity tolerance;Impaired balance (sitting and/or standing);Decreased knowledge of use of DME or AE;Decreased knowledge of precautions;Impaired sensation;Obesity;Pain      OT Treatment/Interventions: Self-care/ADL training;DME and/or AE instruction;Energy conservation;Therapeutic exercise;Therapeutic activities;Patient/family education;Balance training    OT Goals(Current goals can be found in the care plan section) Acute Rehab OT Goals Patient Stated Goal: to get stronger and get my independence back OT Goal Formulation: With  patient Time For Goal Achievement: 02/19/20 Potential to Achieve Goals: Good  OT Frequency: Min 2X/week   Barriers to D/C:            Co-evaluation              AM-PAC OT "6 Clicks" Daily Activity     Outcome Measure Help from another person eating meals?: None Help from another person taking care of personal grooming?: A Little Help from another person toileting, which includes using toliet, bedpan, or urinal?: A Lot Help from another person bathing (including washing, rinsing, drying)?: A Lot Help from another person to put on and taking off regular upper body clothing?: A Little Help from another person to put on and taking off regular lower body clothing?: Total 6 Click Score: 15   End of Session Equipment Utilized During Treatment: Gait belt Nurse Communication: Mobility status  Activity Tolerance: Patient tolerated treatment well Patient left: in chair;with call bell/phone within reach  OT Visit Diagnosis: Other abnormalities of gait and mobility (R26.89);Muscle weakness (generalized) (M62.81);Pain;History of falling (Z91.81) Pain - Right/Left: Left Pain - part of body: Hip(back)                Time: 0347-4259 OT Time Calculation (min): 37 min Charges:  OT General Charges $OT Visit: 1 Visit OT Evaluation $OT Eval Moderate Complexity: 1 Mod OT Treatments $Self Care/Home Management : 8-22 mins  Jolaine Artist, OT Acute Rehabilitation Services Pager [phone]  Office [place] 02/05/2020, 10:33 AM

## 2020-02-05 NOTE — Care Management Important Message (Signed)
Important Message  Patient Details  Name: April [name] MRN: [id number] Date of Birth: [birth year]   Medicare Important Message Given:  Yes     Shelda Altes 02/05/2020, 9:22 AM

## 2020-02-05 NOTE — Plan of Care (Signed)

## 2020-02-06 ENCOUNTER — Telehealth: Payer: Self-pay

## 2020-02-06 LAB — CBC WITH DIFFERENTIAL/PLATELET
Abs Immature Granulocytes: 0.1 10*3/uL — ABNORMAL HIGH (ref 0.00–0.07)
Basophils Absolute: 0 10*3/uL (ref 0.0–0.1)
Basophils Relative: 0 %
Eosinophils Absolute: 0.1 10*3/uL (ref 0.0–0.5)
Eosinophils Relative: 1 %
HCT: 28.8 % — ABNORMAL LOW (ref 36.0–46.0)
Hemoglobin: 8.2 g/dL — ABNORMAL LOW (ref 12.0–15.0)
Immature Granulocytes: 1 %
Lymphocytes Relative: 7 %
Lymphs Abs: 0.7 10*3/uL (ref 0.7–4.0)
MCH: 28 pg (ref 26.0–34.0)
MCHC: 28.5 g/dL — ABNORMAL LOW (ref 30.0–36.0)
MCV: 98.3 fL (ref 80.0–100.0)
Monocytes Absolute: 0.3 10*3/uL (ref 0.1–1.0)
Monocytes Relative: 3 %
Neutro Abs: 8.5 10*3/uL — ABNORMAL HIGH (ref 1.7–7.7)
Neutrophils Relative %: 88 %
Platelets: 211 10*3/uL (ref 150–400)
RBC: 2.93 MIL/uL — ABNORMAL LOW (ref 3.87–5.11)
RDW: 20.9 % — ABNORMAL HIGH (ref 11.5–15.5)
WBC: 9.6 10*3/uL (ref 4.0–10.5)
nRBC: 0 % (ref 0.0–0.2)

## 2020-02-06 LAB — TROPONIN I (HIGH SENSITIVITY): Troponin I (High Sensitivity): 6 ng/L (ref <18)

## 2020-02-06 LAB — CULTURE, BLOOD (ROUTINE X 2): Culture: NO GROWTH

## 2020-02-06 LAB — COMPREHENSIVE METABOLIC PANEL
ALT: 9 U/L (ref 0–44)
AST: 16 U/L (ref 15–41)
Albumin: 2.6 g/dL — ABNORMAL LOW (ref 3.5–5.0)
Alkaline Phosphatase: 118 U/L (ref 38–126)
Anion gap: 11 (ref 5–15)
BUN: 20 mg/dL (ref 8–23)
CO2: 22 mmol/L (ref 22–32)
Calcium: 9.3 mg/dL (ref 8.9–10.3)
Chloride: 107 mmol/L (ref 98–111)
Creatinine, Ser: 0.88 mg/dL (ref 0.44–1.00)
GFR calc Af Amer: >60 mL/min (ref >60)
GFR calc non Af Amer: >60 mL/min (ref >60)
Glucose, Bld: 185 mg/dL — ABNORMAL HIGH (ref 70–99)
Potassium: 4 mmol/L (ref 3.5–5.1)
Sodium: 140 mmol/L (ref 135–145)
Total Bilirubin: 1.1 mg/dL (ref 0.3–1.2)
Total Protein: 6.7 g/dL (ref 6.5–8.1)

## 2020-02-06 LAB — PHOSPHORUS: Phosphorus: 1.9 mg/dL — ABNORMAL LOW (ref 2.5–4.6)

## 2020-02-06 LAB — MAGNESIUM: Magnesium: 1.4 mg/dL — ABNORMAL LOW (ref 1.7–2.4)

## 2020-02-06 MED ORDER — FUROSEMIDE 40 MG PO TABS: 40.0000 mg | ORAL_TABLET | Freq: Every day | ORAL | 0 refills | Status: DC

## 2020-02-06 MED ORDER — METHOCARBAMOL 500 MG PO TABS
500.0000 mg | ORAL_TABLET | Freq: Four times a day (QID) | ORAL | Status: DC
  Administered 2020-02-06 (×3): 500 mg via ORAL
  Filled 2020-02-06 (×3): qty 1

## 2020-02-06 MED ORDER — GUAIFENESIN ER 600 MG PO TB12: 600.0000 mg | ORAL_TABLET | Freq: Two times a day (BID) | ORAL | 0 refills | Status: DC

## 2020-02-06 MED ORDER — GUAIFENESIN ER 600 MG PO TB12: 1200.0000 mg | ORAL_TABLET | Freq: Two times a day (BID) | ORAL | 0 refills | Status: DC

## 2020-02-06 MED ORDER — METOPROLOL TARTRATE 25 MG PO TABS: 25.0000 mg | ORAL_TABLET | Freq: Two times a day (BID) | ORAL | Status: DC

## 2020-02-06 MED ORDER — METOPROLOL TARTRATE 25 MG PO TABS: 12.5000 mg | ORAL_TABLET | Freq: Two times a day (BID) | ORAL | 0 refills | Status: DC

## 2020-02-06 MED ORDER — METOPROLOL TARTRATE 25 MG PO TABS: 25.0000 mg | ORAL_TABLET | Freq: Two times a day (BID) | ORAL | 0 refills | Status: DC

## 2020-02-06 MED ORDER — MAGNESIUM SULFATE 50 % IJ SOLN
3.0000 g | Freq: Once | INTRAVENOUS | Status: AC
  Administered 2020-02-06: 3 g via INTRAVENOUS
  Filled 2020-02-06: qty 6

## 2020-02-06 MED ORDER — SACCHAROMYCES BOULARDII 250 MG PO CAPS: 250.0000 mg | ORAL_CAPSULE | Freq: Two times a day (BID) | ORAL | 0 refills | Status: DC

## 2020-02-06 MED ORDER — METOPROLOL TARTRATE 12.5 MG HALF TABLET
12.5000 mg | ORAL_TABLET | Freq: Two times a day (BID) | ORAL | Status: DC
  Administered 2020-02-06: 12.5 mg via ORAL
  Filled 2020-02-06: qty 1

## 2020-02-06 MED ORDER — K PHOS MONO-SOD PHOS DI & MONO 155-852-130 MG PO TABS
500.0000 mg | ORAL_TABLET | Freq: Two times a day (BID) | ORAL | Status: DC
  Administered 2020-02-06: 500 mg via ORAL
  Filled 2020-02-06 (×2): qty 2

## 2020-02-06 MED ORDER — LIDOCAINE 5 % EX PTCH: 1.0000 | MEDICATED_PATCH | CUTANEOUS | 0 refills | Status: DC

## 2020-02-06 MED ORDER — SALINE SPRAY 0.65 % NA SOLN: 1.0000 | NASAL | 0 refills | Status: DC | PRN

## 2020-02-06 MED ORDER — AMOXICILLIN-POT CLAVULANATE 875-125 MG PO TABS
1.0000 | ORAL_TABLET | Freq: Two times a day (BID) | ORAL | Status: DC
  Administered 2020-02-06: 1 via ORAL
  Filled 2020-02-06: qty 1

## 2020-02-06 MED ORDER — AMOXICILLIN-POT CLAVULANATE 875-125 MG PO TABS: 1.0000 | ORAL_TABLET | Freq: Two times a day (BID) | ORAL | 0 refills | Status: DC

## 2020-02-06 NOTE — Progress Notes (Signed)
Inpatient Rehabilitation-Admissions Coordinator   Met with pt bedside for IP Rehab assessment. Pt recently with [place] in IP Rehab from 1/22-2/12. While pt was able to DC home after CIR her participation was greatly limited by pain and activity tolerance. Pt expressed concerns about her ability to tolerate CIR this time around due to extreme weakness. While we discussed that her pain and injuries that she dealt with back in January have improved, she is still hesitant to commit to another round of IP Rehab. After lengthy discussion, pt has decided she wants to pursue Home with [facility] at this time. AC will communicate pt preference with TOC team and will sign off.   Please call if questions.   Raechel Ache, OTR/L  Rehab Admissions Coordinator  [phone] 02/06/2020 1:04 PM

## 2020-02-06 NOTE — TOC Transition Note (Signed)
Transition of Care [facility]) - CM/SW Discharge Note   Patient Details  Name: April [name] MRN: [id number] Date of Birth: 1956-06-30  Transition of [facility]) CM/SW Contact:  Trula Ore, [place] Phone Number: 02/06/2020, 4:31 PM   Clinical Narrative:     Patient will DC to: Home  Anticipated DC date: 02/06/2020  Family notified: Daughter Web designer by: [name]  ?  Per MD patient ready for DC to home . RN, patient, patient's family, and facility notified of DC. [name] RN given number for report.  Ambulance transport requested for patient.  CSW signing off.  Final next level of care: Other (comment)([id number] [place] [id number]) Barriers to Discharge: No Barriers Identified   Patient Goals and CMS Choice Patient states their goals for this hospitalization and ongoing recovery are:: Patient is going home CMS Medicare.gov Compare Post Acute Care list provided to:: Patient Choice offered to / list presented to : (Currently active with [facility])  Discharge Placement              Patient chooses bed at: [id number] [place] [address]) Patient to be transferred to facility by: PTAR/HOME Name of family member notified: Daughter [name] Patient and family notified of of transfer: 02/06/20  Discharge Plan and Services In-house Referral: NA Discharge Planning Services: CM Consult Post Acute Care Choice: Home Health, Resumption of Svcs/PTA Provider                    HH Arranged: OT, Nurse's Aide, Social Work [employer] Agency: [facility] [employer] Date [place]: 02/06/20 Time [place]: 43 Representative spoke with at [place]: [place] ([place]) Interventions     Readmission Risk Interventions Readmission Risk Prevention Plan 04/20/2019  Transportation Screening Complete  PCP or Specialist Appt within 3-5 Days Not Complete  Not Complete comments not yet ready for d/c  HRI or  [place] Not Complete  HRI or Home Care Consult comments no needs identified  Social Work Consult for [place] Planning/Counseling Not Complete  SW consult not completed comments no needs identified  Palliative Care Screening Not Applicable  Medication Review Press photographer) Complete  Some recent data might be hidden

## 2020-02-06 NOTE — Progress Notes (Signed)
Physical Therapy Treatment Patient Details Name: April [name] MRN: [id number] DOB: [birth year] Today's Date: 02/06/2020    History of Present Illness 64 y.o. 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 Recent hip and pelvic fxs in January.    PT Comments    Pt needs some encouragement with completing tasks, she seems apprehensive about some mobility from past experiences and reports having multi deficits. Pt was able to get to edge of bed with stand by assist using bed rail, was able to stand with mod A and RW at edge of bed x 2, max stand approx 45sec, pt was also able to stand and pivot to recliner with mod/max a and RW. If pt is able to transfer tor recliner she may transfer to w/c and may be mobile around home this way. Pt is not sure if she would be abl to tolerate inpt rehab pace but also states she can not go to SNF as she is a transplant pt. She may do well with home health but will need 24hour or at very least out of bed assist.     Follow Up Recommendations  Home health PT;Supervision/Assistance - 24 hour     Equipment Recommendations  None recommended by PT    Recommendations for Other Services       Precautions / Restrictions Precautions Precautions: Fall Precaution Comments: L wrist brace for comfort Restrictions Weight Bearing Restrictions: No LUE Weight Bearing: Weight bearing as tolerated LLE Weight Bearing: Weight bearing as tolerated    Mobility  Bed Mobility Overal bed mobility: Needs Assistance Bed Mobility: Supine to Sit     Supine to sit: HOB elevated;Supervision     General bed mobility comments: able to get to edge of bed with set up and cues, uses bed rails  Transfers Overall transfer level: Needs assistance Equipment used: Rolling walker (2 wheeled) Transfers: Sit to/from [facility] Sit to Stand: Mod  assist Stand pivot transfers: Max assist       General transfer comment: able to stand x 2 at edge of bed with RW and mod a, stand pivot to recliner with mod/max a and RW  Ambulation/Gait             General Gait Details: did not attempt yet   Stairs             Wheelchair Mobility    Modified Rankin (Stroke Patients Only)       Balance Overall balance assessment: Needs assistance Sitting-balance support: Feet supported Sitting balance-Leahy Scale: Fair     Standing balance support: During functional activity;Bilateral upper extremity supported Standing balance-Leahy Scale: Poor Standing balance comment: needed UE support on walker and max a                            Cognition Arousal/Alertness: Awake/alert Behavior During Therapy: WFL for tasks assessed/performed Overall Cognitive Status: Within Functional Limits for tasks assessed                                        Exercises      General Comments General comments (skin integrity, edema, etc.): VSS on RA HR elevated today      Pertinent Vitals/Pain Pain Assessment: Faces Faces Pain Scale: Hurts little  more Pain Location: L hip, low back Pain Intervention(s): Limited activity within patient's tolerance;Monitored during session    Home Living                      Prior Function            PT Goals (current goals can now be found in the care plan section) Acute Rehab PT Goals Patient Stated Goal: to get stronger and get my independence back PT Goal Formulation: With patient Time For Goal Achievement: 02/18/20 Potential to Achieve Goals: Good Progress towards PT goals: Progressing toward goals    Frequency    Min 3X/week      PT Plan Current plan remains appropriate    Co-evaluation              AM-PAC PT "6 Clicks" Mobility   Outcome Measure  Help needed turning from your back to your side while in a flat bed without using  bedrails?: A Little Help needed moving from lying on your back to sitting on the side of a flat bed without using bedrails?: A Little Help needed moving to and from a bed to a chair (including a wheelchair)?: A Lot Help needed standing up from a chair using your arms (e.g., wheelchair or bedside chair)?: A Lot Help needed to walk in hospital room?: Total Help needed climbing 3-5 steps with a railing? : Total 6 Click Score: 12    End of Session Equipment Utilized During Treatment: Gait belt Activity Tolerance: Patient limited by fatigue;Patient limited by lethargy;Patient tolerated treatment well Patient left: with call bell/phone within reach;in chair Nurse Communication: Mobility status PT Visit Diagnosis: Muscle weakness (generalized) (M62.81);History of falling (Z91.81)     Time: 0240-9735 PT Time Calculation (min) (ACUTE ONLY): 38 min  Charges:  $Therapeutic Activity: 23-37 mins $Neuromuscular Re-education: 8-22 mins                     Horald Chestnut, PT    Delford Field 02/06/2020, 10:53 AM

## 2020-02-06 NOTE — Plan of Care (Signed)
  Problem: Education: Goal: Knowledge of General Education information will improve Description: Including pain rating scale, medication(s)/side effects and non-pharmacologic comfort measures 02/06/2020 1707 by Camillia Herter, RN Outcome: Adequate for Discharge 02/06/2020 0716 by Camillia Herter, RN Outcome: Progressing   Problem: Health Behavior/Discharge Planning: Goal: Ability to manage health-related needs will improve 02/06/2020 1707 by Camillia Herter, RN Outcome: Adequate for Discharge 02/06/2020 0716 by Camillia Herter, RN Outcome: Progressing   Problem: Clinical Measurements: Goal: Ability to maintain clinical measurements within normal limits will improve 02/06/2020 1707 by Camillia Herter, RN Outcome: Adequate for Discharge 02/06/2020 0716 by Camillia Herter, RN Outcome: Progressing Goal: Will remain free from infection 02/06/2020 1707 by Camillia Herter, RN Outcome: Adequate for Discharge 02/06/2020 0716 by Camillia Herter, RN Outcome: Progressing Goal: Diagnostic test results will improve 02/06/2020 1707 by Camillia Herter, RN Outcome: Adequate for Discharge 02/06/2020 0716 by Camillia Herter, RN Outcome: Progressing Goal: Respiratory complications will improve 02/06/2020 1707 by Camillia Herter, RN Outcome: Adequate for Discharge 02/06/2020 0716 by Camillia Herter, RN Outcome: Progressing Goal: Cardiovascular complication will be avoided 02/06/2020 1707 by Camillia Herter, RN Outcome: Adequate for Discharge 02/06/2020 0716 by Camillia Herter, RN Outcome: Progressing   Problem: Activity: Goal: Risk for activity intolerance will decrease 02/06/2020 1707 by Camillia Herter, RN Outcome: Adequate for Discharge 02/06/2020 0716 by Camillia Herter, RN Outcome: Progressing   Problem: Nutrition: Goal: Adequate nutrition will be maintained 02/06/2020 1707 by Camillia Herter, RN Outcome: Adequate for Discharge 02/06/2020 0716 by Camillia Herter, RN Outcome: Progressing   Problem:  Coping: Goal: Level of anxiety will decrease 02/06/2020 1707 by Camillia Herter, RN Outcome: Adequate for Discharge 02/06/2020 0716 by Camillia Herter, RN Outcome: Progressing   Problem: Elimination: Goal: Will not experience complications related to bowel motility 02/06/2020 1707 by Camillia Herter, RN Outcome: Adequate for Discharge 02/06/2020 0716 by Camillia Herter, RN Outcome: Progressing Goal: Will not experience complications related to urinary retention 02/06/2020 1707 by Camillia Herter, RN Outcome: Adequate for Discharge 02/06/2020 0716 by Camillia Herter, RN Outcome: Progressing   Problem: Pain Managment: Goal: General experience of comfort will improve 02/06/2020 1707 by Camillia Herter, RN Outcome: Adequate for Discharge 02/06/2020 0716 by Camillia Herter, RN Outcome: Progressing   Problem: Safety: Goal: Ability to remain free from injury will improve 02/06/2020 1707 by Camillia Herter, RN Outcome: Adequate for Discharge 02/06/2020 0716 by Camillia Herter, RN Outcome: Progressing   Problem: Skin Integrity: Goal: Risk for impaired skin integrity will decrease 02/06/2020 1707 by Camillia Herter, RN Outcome: Adequate for Discharge 02/06/2020 0716 by Camillia Herter, RN Outcome: Progressing

## 2020-02-06 NOTE — Plan of Care (Signed)

## 2020-02-06 NOTE — Progress Notes (Signed)
Occupational Therapy Treatment Patient Details Name: April [name] MRN: [id number] DOB: [birth year] Today's Date: 02/06/2020    History of present illness 64 y.o. 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 Recent hip and pelvic fxs in January.   OT comments  Patient seated in recliner and agreeable to OT session.  Pt reports planning to dc home now, as she believe CIR will be too intensive at this time.  She has 24/7 support available, physical assist for transfers/ADLs as needed.  Session focused on ADL simulation from seated position, working on chair pushups for strengthening for ADLs and transfers. Educated on exercises to complete with cane/towel for UE strength. Educated on sitting in w/c at home for at least meals, then progressing as able. Pt with no questions or concerns about dc home. DC recommendations below.    Follow Up Recommendations  Supervision/Assistance - 24 hour;Home health OT(max HH services [facility] aide))    Equipment Recommendations  3 in 1 bedside commode(drop arm )    Recommendations for Other Services      Precautions / Restrictions Precautions Precautions: Fall Precaution Comments: L wrist brace for comfort Restrictions Weight Bearing Restrictions: No LUE Weight Bearing: Weight bearing as tolerated LLE Weight Bearing: Weight bearing as tolerated       Mobility Bed Mobility               General bed mobility comments: OOB upon entry   Transfers Overall transfer level: Needs assistance                    Balance Overall balance assessment: Needs assistance Sitting-balance support: Feet supported Sitting balance-Leahy Scale: Good                                     ADL either performed or assessed with clinical judgement   ADL Overall ADL's : Needs assistance/impaired                      Lower Body Dressing: Maximal assistance;Sitting/lateral leans Lower Body Dressing Details (indicate cue type and reason): simulated      Toileting- Clothing Manipulation and Hygiene: Moderate assistance;Sitting/lateral lean Toileting - Clothing Manipulation Details (indicate cue type and reason): simulated        General ADL Comments: educated on Exercises to increase UE strength, discussed techniques for transfers/toileting for safety with ADLs      Vision       Perception     Praxis      Cognition Arousal/Alertness: Awake/alert Behavior During Therapy: WFL for tasks assessed/performed Overall Cognitive Status: Within Functional Limits for tasks assessed                                          Exercises Exercises: General Upper Extremity;Other exercises General Exercises - Upper Extremity Shoulder Flexion: Strengthening;10 reps;Seated Shoulder Horizontal ABduction: Strengthening;10 reps;Seated Other Exercises Other Exercises: exercises above and forward press using towel with BUEs x 1 set 10 reps  Other Exercises: chair pushups x 5 reps and min assist to ensure forward lean and hip elevation    Shoulder Instructions       General Comments VSS; educated on assist for all transfers,  use of 3:1 drop arm commode and progressing activity tolerance in w/c (pt agreeable and reports she does not plan to remain supine in bed at dc )     Pertinent Vitals/ Pain       Pain Assessment: Faces Faces Pain Scale: Hurts a little bit Pain Location: L hip, low back Pain Descriptors / Indicators: Discomfort Pain Intervention(s): Limited activity within patient's tolerance;Monitored during session;Repositioned  Home Living                                          Prior Functioning/Environment              Frequency  Min 2X/week        Progress Toward Goals  OT Goals(current goals can now be found in the care plan section)  Progress  towards OT goals: Progressing toward goals  Acute Rehab OT Goals Patient Stated Goal: to get stronger and get my independence back OT Goal Formulation: With patient  Plan Frequency remains appropriate;Discharge plan needs to be updated    Co-evaluation                 AM-PAC OT "6 Clicks" Daily Activity     Outcome Measure   Help from another person eating meals?: None Help from another person taking care of personal grooming?: A Little Help from another person toileting, which includes using toliet, bedpan, or urinal?: A Lot Help from another person bathing (including washing, rinsing, drying)?: A Lot Help from another person to put on and taking off regular upper body clothing?: A Little Help from another person to put on and taking off regular lower body clothing?: A Lot 6 Click Score: 16    End of Session    OT Visit Diagnosis: Other abnormalities of gait and mobility (R26.89);Muscle weakness (generalized) (M62.81);Pain;History of falling (Z91.81) Pain - Right/Left: Left Pain - part of body: Hip(back)   Activity Tolerance Patient tolerated treatment well   Patient Left in chair;with call bell/phone within reach;with nursing/sitter in room   Nurse Communication Mobility status        Time: 1346-1410 OT Time Calculation (min): 24 min  Charges: OT General Charges $OT Visit: 1 Visit OT Treatments $Self Care/Home Management : 8-22 mins $Therapeutic Activity: 8-22 mins  Jolaine Artist, OT [place] Pager [phone] Office [phone]     Delight Stare 02/06/2020, 3:21 PM

## 2020-02-06 NOTE — TOC Initial Note (Addendum)
Transition of Care [facility]) - Initial/Assessment Note    Patient Details  Name: April [name] MRN: [id number] Date of Birth: 12-Jun-1956  Transition of [facility]) CM/SW Contact:    Bethena Roys, RN Phone Number: 02/06/2020, 3:42 PM  Clinical Narrative:   Patient wants to return home. Patient was currently active with [facility]- and wants to resume services. Case Manager did make referral to [place] and start of care to begin within 24-48 hours post transition home. Case Manager did call [employer] to see if they have the 3n1 drop arm available. Awaiting call back. Patient will need transport home and states unable to have someone pick her up and needs transportation via [facility]- Form filled out and will make staff RN aware that the patient is ready to transition home. Case Manager will call [facility] for transport. No further needs from Case Manager at this time.                Patient states she has Wheelchair, rolling walker and shower chair in the home.   Dop arm 3n1 to be delivered to the room from [place].    Expected Discharge Plan: [place] Barriers to Discharge: Barriers Resolved   Patient Goals and CMS Choice Patient states their goals for this hospitalization and ongoing recovery are:: "to return home" CMS Medicare.gov Compare Post Acute Care list provided to:: Patient Choice offered to / list presented to : (Currently active with [facility])  Expected Discharge Plan and Services Expected Discharge Plan: [place] In-house Referral: NA Discharge Planning Services: CM Consult Post Acute Care Choice: Home Health, Resumption of Svcs/PTA Provider Living arrangements for the past 2 months: Single Family Home Expected Discharge Date: 02/06/20                         HH Arranged: OT, Nurse's Aide, Social Work [employer] [facility]: [facility] [employer] Date [place]: 02/06/20 Time [place]:  61 Representative spoke with at [place]: [place]  Prior Living Arrangements/Services Living arrangements for the past 2 months: [place] with:: Spouse Patient language and need for interpreter reviewed:: Yes Do you feel safe going back to the place where you live?: Yes      Need for Family Participation in Patient Care: Yes (Comment) Care giver support system in place?: Yes (comment) Current home services: DME, Home PT, Home OT, Homehealth aide, Other (comment)(Social Worker) Criminal Activity/Legal Involvement Pertinent to Current Situation/Hospitalization: No - Comment as needed  Activities of Daily Living Home Assistive Devices/Equipment: Wheelchair ADL Screening (condition at time of admission) Patient's cognitive ability adequate to safely complete daily activities?: Yes Is the patient deaf or have difficulty hearing?: No Does the patient have difficulty seeing, even when wearing glasses/contacts?: No Does the patient have difficulty concentrating, remembering, or making decisions?: No Patient able to express need for assistance with ADLs?: Yes Does the patient have difficulty dressing or bathing?: No Independently performs ADLs?: Yes (appropriate for developmental age) Does the patient have difficulty walking or climbing stairs?: Yes Weakness of Legs: Both Weakness of Arms/Hands: Left  Permission Sought/Granted Permission sought to share information with : Family Supports, Chartered certified accountant granted to share information with : Yes, Verbal Permission Granted     Permission granted to share info w AGENCY: [facility]        Emotional Assessment Appearance:: Appears stated age  Attitude/Demeanor/Rapport: Engaged Affect (typically observed): Appropriate Orientation: : Oriented to Self, Oriented to  Time, Oriented to Situation, Oriented to Place Alcohol / Substance Use: Not Applicable Psych Involvement: No (comment)  Admission  diagnosis:  Shortness of breath [R06.02] Hypoxia [R09.02] Sepsis ([place]) [A41.9] Patient Active Problem List   Diagnosis Date Noted  . Sepsis ([place]) 01/31/2020  . Hypoxia   . Shortness of breath   . Fracture of left superior pubic ramus (HCC)   . Pain   . Anxiety state   . Left displaced femoral neck fracture ([place]) 12/15/2019  . Status post total hip replacement, left   . Post-op pain   . Acute blood loss anemia   . Polycystic kidney   . Closed left hip fracture ([place]) 12/07/2019  . Left wrist fracture 12/07/2019  . Medication management 09/22/2019  . Physical deconditioning 09/22/2019  . Hyponatremia 06/29/2019  . Acute respiratory failure with hypoxia ([place]) 04/19/2019  . Acute on chronic diastolic CHF (congestive heart failure) ([place]) 04/19/2019  . Bilateral closed proximal tibial fracture 12/21/2018  . Closed right ankle fracture 12/21/2018  . Fracture 12/21/2018  . Lower urinary tract infectious disease 10/03/2018  . Asthma, chronic, unspecified asthma severity, with acute exacerbation 10/03/2018  . SIRS (systemic inflammatory response syndrome) ([place]) 10/03/2018  . Nausea vomiting and diarrhea 10/02/2018  . Chronic diastolic CHF (congestive heart failure) ([place]) 10/01/2017  . Influenza B 10/01/2017  . Persistent asthma with status asthmaticus   . Pneumonia 07/15/2016  . Asthma, mild intermittent 07/15/2016  . GERD (gastroesophageal reflux disease) 07/15/2016  . Renal transplant recipient 07/15/2016  . Hyperlipidemia 07/15/2016  . Depression 07/15/2016  . Bronchitis, mucopurulent recurrent ([place]) 08/08/2014  . Chronic cough 07/24/2014  . End stage renal disease ([place]) 03/23/2012  . 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, implant, and graft 03/23/2012   PCP:  [name], MD Pharmacy:   [facility] [address] [place] [address] Phone: [phone] Fax: [phone]     Social  Determinants of Health (SDOH) Interventions    Readmission Risk Interventions Readmission Risk Prevention Plan 04/20/2019  Transportation Screening Complete  PCP or Specialist Appt within 3-5 Days Not Complete  Not Complete comments not yet ready for d/c  HRI or [place] Not Complete  HRI or Home Care Consult comments no needs identified  Social Work Consult for [place] Planning/Counseling Not Complete  SW consult not completed comments no needs identified  Palliative Care Screening Not Applicable  Medication Review Press photographer) Complete  Some recent data might be hidden

## 2020-02-06 NOTE — Plan of Care (Signed)

## 2020-02-08 ENCOUNTER — Emergency Department (HOSPITAL_COMMUNITY): Payer: Medicare Other

## 2020-02-08 ENCOUNTER — Other Ambulatory Visit: Payer: Self-pay

## 2020-02-08 ENCOUNTER — Encounter (HOSPITAL_COMMUNITY): Payer: Self-pay

## 2020-02-08 ENCOUNTER — Inpatient Hospital Stay (HOSPITAL_COMMUNITY)
Admission: EM | Admit: 2020-02-08 | Discharge: 2020-02-23 | DRG: 291 | Disposition: A | Discharge status: Skilled Nursing Facility | Payer: Medicare Other | Attending: Internal Medicine | Admitting: Internal Medicine

## 2020-02-08 DIAGNOSIS — H04129 Dry eye syndrome of unspecified lacrimal gland: Secondary | ICD-10-CM | POA: Diagnosis not present

## 2020-02-08 DIAGNOSIS — Z91041 Radiographic dye allergy status: Secondary | ICD-10-CM | POA: Diagnosis not present

## 2020-02-08 DIAGNOSIS — M109 Gout, unspecified: Secondary | ICD-10-CM | POA: Diagnosis present

## 2020-02-08 DIAGNOSIS — Z940 Kidney transplant status: Secondary | ICD-10-CM | POA: Diagnosis not present

## 2020-02-08 DIAGNOSIS — H409 Unspecified glaucoma: Secondary | ICD-10-CM | POA: Diagnosis present

## 2020-02-08 DIAGNOSIS — F329 Major depressive disorder, single episode, unspecified: Secondary | ICD-10-CM | POA: Diagnosis present

## 2020-02-08 DIAGNOSIS — I5032 Chronic diastolic (congestive) heart failure: Secondary | ICD-10-CM | POA: Diagnosis not present

## 2020-02-08 DIAGNOSIS — I471 Supraventricular tachycardia, unspecified: Secondary | ICD-10-CM

## 2020-02-08 DIAGNOSIS — Z888 Allergy status to other drugs, medicaments and biological substances status: Secondary | ICD-10-CM | POA: Diagnosis not present

## 2020-02-08 DIAGNOSIS — D696 Thrombocytopenia, unspecified: Secondary | ICD-10-CM | POA: Diagnosis present

## 2020-02-08 DIAGNOSIS — R0902 Hypoxemia: Secondary | ICD-10-CM | POA: Diagnosis not present

## 2020-02-08 DIAGNOSIS — K219 Gastro-esophageal reflux disease without esophagitis: Secondary | ICD-10-CM | POA: Diagnosis present

## 2020-02-08 DIAGNOSIS — Z515 Encounter for palliative care: Secondary | ICD-10-CM | POA: Diagnosis not present

## 2020-02-08 DIAGNOSIS — J9601 Acute respiratory failure with hypoxia: Secondary | ICD-10-CM | POA: Diagnosis present

## 2020-02-08 DIAGNOSIS — R197 Diarrhea, unspecified: Secondary | ICD-10-CM

## 2020-02-08 DIAGNOSIS — G9341 Metabolic encephalopathy: Secondary | ICD-10-CM | POA: Diagnosis present

## 2020-02-08 DIAGNOSIS — Z20822 Contact with and (suspected) exposure to covid-19: Secondary | ICD-10-CM | POA: Diagnosis present

## 2020-02-08 DIAGNOSIS — J40 Bronchitis, not specified as acute or chronic: Secondary | ICD-10-CM

## 2020-02-08 DIAGNOSIS — R6889 Other general symptoms and signs: Secondary | ICD-10-CM | POA: Diagnosis present

## 2020-02-08 DIAGNOSIS — Z883 Allergy status to other anti-infective agents status: Secondary | ICD-10-CM

## 2020-02-08 DIAGNOSIS — D849 Immunodeficiency, unspecified: Secondary | ICD-10-CM | POA: Diagnosis present

## 2020-02-08 DIAGNOSIS — R5081 Fever presenting with conditions classified elsewhere: Secondary | ICD-10-CM

## 2020-02-08 DIAGNOSIS — J45909 Unspecified asthma, uncomplicated: Secondary | ICD-10-CM | POA: Diagnosis present

## 2020-02-08 DIAGNOSIS — E2740 Unspecified adrenocortical insufficiency: Secondary | ICD-10-CM | POA: Diagnosis present

## 2020-02-08 DIAGNOSIS — D539 Nutritional anemia, unspecified: Secondary | ICD-10-CM | POA: Diagnosis present

## 2020-02-08 DIAGNOSIS — Z885 Allergy status to narcotic agent status: Secondary | ICD-10-CM

## 2020-02-08 DIAGNOSIS — R630 Anorexia: Secondary | ICD-10-CM

## 2020-02-08 DIAGNOSIS — Z8249 Family history of ischemic heart disease and other diseases of the circulatory system: Secondary | ICD-10-CM

## 2020-02-08 DIAGNOSIS — I10 Essential (primary) hypertension: Secondary | ICD-10-CM | POA: Diagnosis present

## 2020-02-08 DIAGNOSIS — R0600 Dyspnea, unspecified: Secondary | ICD-10-CM | POA: Diagnosis not present

## 2020-02-08 DIAGNOSIS — E785 Hyperlipidemia, unspecified: Secondary | ICD-10-CM | POA: Diagnosis present

## 2020-02-08 DIAGNOSIS — Z7401 Bed confinement status: Secondary | ICD-10-CM | POA: Diagnosis not present

## 2020-02-08 DIAGNOSIS — R5381 Other malaise: Secondary | ICD-10-CM | POA: Diagnosis not present

## 2020-02-08 DIAGNOSIS — I4892 Unspecified atrial flutter: Secondary | ICD-10-CM | POA: Diagnosis present

## 2020-02-08 DIAGNOSIS — R6510 Systemic inflammatory response syndrome (SIRS) of non-infectious origin without acute organ dysfunction: Secondary | ICD-10-CM | POA: Diagnosis present

## 2020-02-08 DIAGNOSIS — R6520 Severe sepsis without septic shock: Secondary | ICD-10-CM | POA: Diagnosis not present

## 2020-02-08 DIAGNOSIS — Z8271 Family history of polycystic kidney: Secondary | ICD-10-CM

## 2020-02-08 DIAGNOSIS — M1A9XX0 Chronic gout, unspecified, without tophus (tophi): Secondary | ICD-10-CM | POA: Diagnosis not present

## 2020-02-08 DIAGNOSIS — Z881 Allergy status to other antibiotic agents status: Secondary | ICD-10-CM

## 2020-02-08 DIAGNOSIS — D638 Anemia in other chronic diseases classified elsewhere: Secondary | ICD-10-CM

## 2020-02-08 DIAGNOSIS — Z87891 Personal history of nicotine dependence: Secondary | ICD-10-CM | POA: Diagnosis not present

## 2020-02-08 DIAGNOSIS — J45902 Unspecified asthma with status asthmaticus: Secondary | ICD-10-CM | POA: Diagnosis not present

## 2020-02-08 DIAGNOSIS — I110 Hypertensive heart disease with heart failure: Principal | ICD-10-CM | POA: Diagnosis present

## 2020-02-08 DIAGNOSIS — M1990 Unspecified osteoarthritis, unspecified site: Secondary | ICD-10-CM | POA: Diagnosis present

## 2020-02-08 DIAGNOSIS — Q613 Polycystic kidney, unspecified: Secondary | ICD-10-CM

## 2020-02-08 DIAGNOSIS — A419 Sepsis, unspecified organism: Secondary | ICD-10-CM | POA: Diagnosis present

## 2020-02-08 DIAGNOSIS — I4589 Other specified conduction disorders: Secondary | ICD-10-CM | POA: Diagnosis present

## 2020-02-08 DIAGNOSIS — Z7951 Long term (current) use of inhaled steroids: Secondary | ICD-10-CM

## 2020-02-08 DIAGNOSIS — Z7952 Long term (current) use of systemic steroids: Secondary | ICD-10-CM | POA: Diagnosis not present

## 2020-02-08 DIAGNOSIS — D649 Anemia, unspecified: Secondary | ICD-10-CM | POA: Diagnosis not present

## 2020-02-08 DIAGNOSIS — R0602 Shortness of breath: Secondary | ICD-10-CM

## 2020-02-08 DIAGNOSIS — R509 Fever, unspecified: Secondary | ICD-10-CM | POA: Diagnosis not present

## 2020-02-08 DIAGNOSIS — R252 Cramp and spasm: Secondary | ICD-10-CM | POA: Diagnosis present

## 2020-02-08 DIAGNOSIS — F419 Anxiety disorder, unspecified: Secondary | ICD-10-CM | POA: Diagnosis present

## 2020-02-08 DIAGNOSIS — R609 Edema, unspecified: Secondary | ICD-10-CM | POA: Diagnosis present

## 2020-02-08 DIAGNOSIS — Z9289 Personal history of other medical treatment: Secondary | ICD-10-CM

## 2020-02-08 DIAGNOSIS — Z7189 Other specified counseling: Secondary | ICD-10-CM | POA: Diagnosis not present

## 2020-02-08 DIAGNOSIS — R112 Nausea with vomiting, unspecified: Secondary | ICD-10-CM | POA: Diagnosis not present

## 2020-02-08 DIAGNOSIS — E213 Hyperparathyroidism, unspecified: Secondary | ICD-10-CM | POA: Diagnosis present

## 2020-02-08 DIAGNOSIS — Z7982 Long term (current) use of aspirin: Secondary | ICD-10-CM

## 2020-02-08 DIAGNOSIS — Z79899 Other long term (current) drug therapy: Secondary | ICD-10-CM

## 2020-02-08 DIAGNOSIS — Z9181 History of falling: Secondary | ICD-10-CM

## 2020-02-08 HISTORY — DX: Essential (primary) hypertension: I10

## 2020-02-08 LAB — CBC WITH DIFFERENTIAL/PLATELET
Abs Immature Granulocytes: 0.12 10*3/uL — ABNORMAL HIGH (ref 0.00–0.07)
Basophils Absolute: 0 10*3/uL (ref 0.0–0.1)
Basophils Relative: 0 %
Eosinophils Absolute: 0.1 10*3/uL (ref 0.0–0.5)
Eosinophils Relative: 1 %
HCT: 31.6 % — ABNORMAL LOW (ref 36.0–46.0)
Hemoglobin: 8.9 g/dL — ABNORMAL LOW (ref 12.0–15.0)
Immature Granulocytes: 2 %
Lymphocytes Relative: 14 %
Lymphs Abs: 1.2 10*3/uL (ref 0.7–4.0)
MCH: 27.9 pg (ref 26.0–34.0)
MCHC: 28.2 g/dL — ABNORMAL LOW (ref 30.0–36.0)
MCV: 99.1 fL (ref 80.0–100.0)
Monocytes Absolute: 0.3 10*3/uL (ref 0.1–1.0)
Monocytes Relative: 3 %
Neutro Abs: 6.6 10*3/uL (ref 1.7–7.7)
Neutrophils Relative %: 80 %
Platelets: 197 10*3/uL (ref 150–400)
RBC: 3.19 MIL/uL — ABNORMAL LOW (ref 3.87–5.11)
RDW: 21.8 % — ABNORMAL HIGH (ref 11.5–15.5)
WBC: 8.2 10*3/uL (ref 4.0–10.5)
nRBC: 0 % (ref 0.0–0.2)

## 2020-02-08 LAB — I-STAT CHEM 8, ED
BUN: 15 mg/dL (ref 8–23)
Calcium, Ion: 1.23 mmol/L (ref 1.15–1.40)
Chloride: 101 mmol/L (ref 98–111)
Creatinine, Ser: 0.6 mg/dL (ref 0.44–1.00)
Glucose, Bld: 91 mg/dL (ref 70–99)
HCT: 29 % — ABNORMAL LOW (ref 36.0–46.0)
Hemoglobin: 9.9 g/dL — ABNORMAL LOW (ref 12.0–15.0)
Potassium: 4.1 mmol/L (ref 3.5–5.1)
Sodium: 134 mmol/L — ABNORMAL LOW (ref 135–145)
TCO2: 26 mmol/L (ref 22–32)

## 2020-02-08 LAB — COMPREHENSIVE METABOLIC PANEL
ALT: 10 U/L (ref 0–44)
AST: 15 U/L (ref 15–41)
Albumin: 2.7 g/dL — ABNORMAL LOW (ref 3.5–5.0)
Alkaline Phosphatase: 117 U/L (ref 38–126)
Anion gap: 12 (ref 5–15)
BUN: 14 mg/dL (ref 8–23)
CO2: 22 mmol/L (ref 22–32)
Calcium: 9.4 mg/dL (ref 8.9–10.3)
Chloride: 101 mmol/L (ref 98–111)
Creatinine, Ser: 0.72 mg/dL (ref 0.44–1.00)
GFR calc Af Amer: >60 mL/min (ref >60)
GFR calc non Af Amer: >60 mL/min (ref >60)
Glucose, Bld: 102 mg/dL — ABNORMAL HIGH (ref 70–99)
Potassium: 4 mmol/L (ref 3.5–5.1)
Sodium: 135 mmol/L (ref 135–145)
Total Bilirubin: 1.6 mg/dL — ABNORMAL HIGH (ref 0.3–1.2)
Total Protein: 6.8 g/dL (ref 6.5–8.1)

## 2020-02-08 LAB — LACTIC ACID, PLASMA: Lactic Acid, Venous: 0.9 mmol/L (ref 0.5–1.9)

## 2020-02-08 LAB — PROCALCITONIN: Procalcitonin: 0.34 ng/mL

## 2020-02-08 LAB — BRAIN NATRIURETIC PEPTIDE: B Natriuretic Peptide: 459.3 pg/mL — ABNORMAL HIGH (ref 0.0–100.0)

## 2020-02-08 LAB — APTT: aPTT: 35 seconds (ref 24–36)

## 2020-02-08 LAB — SARS CORONAVIRUS 2 (TAT 6-24 HRS): SARS Coronavirus 2: NEGATIVE

## 2020-02-08 LAB — PROTIME-INR
INR: 1.2 (ref 0.8–1.2)
Prothrombin Time: 15.5 seconds — ABNORMAL HIGH (ref 11.4–15.2)

## 2020-02-08 MED ORDER — ALLOPURINOL 300 MG PO TABS
300.0000 mg | ORAL_TABLET | Freq: Every day | ORAL | Status: DC
  Administered 2020-02-08: 100 mg via ORAL
  Administered 2020-02-09 – 2020-02-23 (×15): 300 mg via ORAL
  Filled 2020-02-08 (×12): qty 1
  Filled 2020-02-08: qty 3
  Filled 2020-02-08 (×3): qty 1

## 2020-02-08 MED ORDER — TACROLIMUS 1 MG PO CAPS
1.0000 mg | ORAL_CAPSULE | Freq: Every day | ORAL | Status: DC
  Administered 2020-02-08 – 2020-02-23 (×16): 1 mg via ORAL
  Filled 2020-02-08 (×16): qty 1

## 2020-02-08 MED ORDER — METRONIDAZOLE IN NACL 5-0.79 MG/ML-% IV SOLN: 500.0000 mg | Freq: Three times a day (TID) | INTRAVENOUS | Status: DC

## 2020-02-08 MED ORDER — MYCOPHENOLATE SODIUM 180 MG PO TBEC
180.0000 mg | DELAYED_RELEASE_TABLET | Freq: Two times a day (BID) | ORAL | Status: DC
  Administered 2020-02-08 – 2020-02-23 (×30): 180 mg via ORAL
  Filled 2020-02-08 (×31): qty 1

## 2020-02-08 MED ORDER — CYCLOSPORINE 0.05 % OP EMUL
1.0000 [drp] | Freq: Two times a day (BID) | OPHTHALMIC | Status: DC
  Administered 2020-02-08 – 2020-02-23 (×29): 1 [drp] via OPHTHALMIC
  Filled 2020-02-08 (×33): qty 1

## 2020-02-08 MED ORDER — ACETAMINOPHEN 650 MG RE SUPP: 650.0000 mg | Freq: Four times a day (QID) | RECTAL | Status: DC | PRN

## 2020-02-08 MED ORDER — SACCHAROMYCES BOULARDII 250 MG PO CAPS
250.0000 mg | ORAL_CAPSULE | Freq: Two times a day (BID) | ORAL | Status: DC
  Administered 2020-02-08 – 2020-02-23 (×30): 250 mg via ORAL
  Filled 2020-02-08 (×31): qty 1

## 2020-02-08 MED ORDER — BRIMONIDINE TARTRATE 0.15 % OP SOLN
1.0000 [drp] | Freq: Two times a day (BID) | OPHTHALMIC | Status: DC
  Administered 2020-02-08 – 2020-02-23 (×28): 1 [drp] via OPHTHALMIC
  Filled 2020-02-08 (×3): qty 5

## 2020-02-08 MED ORDER — HYDROCORTISONE NA SUCCINATE PF 100 MG IJ SOLR
50.0000 mg | Freq: Four times a day (QID) | INTRAMUSCULAR | Status: DC
  Administered 2020-02-08 – 2020-02-10 (×8): 50 mg via INTRAVENOUS
  Filled 2020-02-08 (×10): qty 2

## 2020-02-08 MED ORDER — ALBUTEROL SULFATE (2.5 MG/3ML) 0.083% IN NEBU
2.5000 mg | INHALATION_SOLUTION | RESPIRATORY_TRACT | Status: DC | PRN
  Administered 2020-02-11 – 2020-02-17 (×5): 2.5 mg via RESPIRATORY_TRACT
  Filled 2020-02-08 (×5): qty 3

## 2020-02-08 MED ORDER — LATANOPROST 0.005 % OP SOLN
1.0000 [drp] | Freq: Every day | OPHTHALMIC | Status: DC
  Administered 2020-02-08 – 2020-02-22 (×13): 1 [drp] via OPHTHALMIC
  Filled 2020-02-08 (×2): qty 2.5

## 2020-02-08 MED ORDER — VANCOMYCIN HCL 1500 MG/300ML IV SOLN
1500.0000 mg | Freq: Once | INTRAVENOUS | Status: AC
  Administered 2020-02-08: 1500 mg via INTRAVENOUS
  Filled 2020-02-08: qty 300

## 2020-02-08 MED ORDER — METHOCARBAMOL 500 MG PO TABS
500.0000 mg | ORAL_TABLET | Freq: Four times a day (QID) | ORAL | Status: DC
  Administered 2020-02-08 – 2020-02-23 (×56): 500 mg via ORAL
  Filled 2020-02-08 (×56): qty 1

## 2020-02-08 MED ORDER — PANTOPRAZOLE SODIUM 40 MG PO TBEC
40.0000 mg | DELAYED_RELEASE_TABLET | Freq: Two times a day (BID) | ORAL | Status: DC
  Administered 2020-02-08 – 2020-02-23 (×30): 40 mg via ORAL
  Filled 2020-02-08 (×30): qty 1

## 2020-02-08 MED ORDER — GUAIFENESIN ER 600 MG PO TB12
600.0000 mg | ORAL_TABLET | Freq: Two times a day (BID) | ORAL | Status: DC
  Administered 2020-02-09 – 2020-02-23 (×28): 600 mg via ORAL
  Filled 2020-02-08 (×30): qty 1

## 2020-02-08 MED ORDER — METOCLOPRAMIDE HCL 5 MG/ML IJ SOLN
5.0000 mg | Freq: Once | INTRAMUSCULAR | Status: AC
  Administered 2020-02-08: 5 mg via INTRAVENOUS
  Filled 2020-02-08: qty 2

## 2020-02-08 MED ORDER — DORZOLAMIDE HCL 2 % OP SOLN
1.0000 [drp] | Freq: Every day | OPHTHALMIC | Status: DC
  Administered 2020-02-09 – 2020-02-23 (×13): 1 [drp] via OPHTHALMIC
  Filled 2020-02-08 (×3): qty 10

## 2020-02-08 MED ORDER — MOMETASONE FURO-FORMOTEROL FUM 200-5 MCG/ACT IN AERO
2.0000 | INHALATION_SPRAY | Freq: Two times a day (BID) | RESPIRATORY_TRACT | Status: DC
  Administered 2020-02-09 – 2020-02-23 (×30): 2 via RESPIRATORY_TRACT
  Filled 2020-02-08: qty 8.8

## 2020-02-08 MED ORDER — FLUOXETINE HCL 20 MG PO CAPS
40.0000 mg | ORAL_CAPSULE | Freq: Every day | ORAL | Status: DC
  Administered 2020-02-09 – 2020-02-23 (×15): 40 mg via ORAL
  Filled 2020-02-08 (×16): qty 2

## 2020-02-08 MED ORDER — ONDANSETRON HCL 4 MG/2ML IJ SOLN
4.0000 mg | Freq: Four times a day (QID) | INTRAMUSCULAR | Status: DC | PRN
  Administered 2020-02-08 – 2020-02-14 (×4): 4 mg via INTRAVENOUS
  Filled 2020-02-08 (×4): qty 2

## 2020-02-08 MED ORDER — TACROLIMUS 0.5 MG PO CAPS
0.5000 mg | ORAL_CAPSULE | Freq: Every day | ORAL | Status: DC
  Administered 2020-02-08 – 2020-02-22 (×15): 0.5 mg via ORAL
  Filled 2020-02-08 (×17): qty 1

## 2020-02-08 MED ORDER — HYDROCODONE-ACETAMINOPHEN 5-325 MG PO TABS: 1.0000 | ORAL_TABLET | ORAL | Status: DC | PRN

## 2020-02-08 MED ORDER — VANCOMYCIN HCL IN DEXTROSE 1-5 GM/200ML-% IV SOLN: 1000.0000 mg | INTRAVENOUS | Status: DC

## 2020-02-08 MED ORDER — ONDANSETRON HCL 4 MG PO TABS: 4.0000 mg | ORAL_TABLET | Freq: Four times a day (QID) | ORAL | Status: DC | PRN

## 2020-02-08 MED ORDER — ASPIRIN 81 MG PO CHEW
81.0000 mg | CHEWABLE_TABLET | Freq: Every day | ORAL | Status: DC
  Administered 2020-02-09 – 2020-02-23 (×14): 81 mg via ORAL
  Filled 2020-02-08 (×15): qty 1

## 2020-02-08 MED ORDER — VANCOMYCIN HCL IN DEXTROSE 1-5 GM/200ML-% IV SOLN: 1000.0000 mg | Freq: Once | INTRAVENOUS | Status: DC

## 2020-02-08 MED ORDER — ACETAMINOPHEN 500 MG PO TABS
1000.0000 mg | ORAL_TABLET | Freq: Once | ORAL | Status: AC
  Administered 2020-02-08: 1000 mg via ORAL
  Filled 2020-02-08: qty 2

## 2020-02-08 MED ORDER — METOPROLOL TARTRATE 25 MG PO TABS
25.0000 mg | ORAL_TABLET | Freq: Two times a day (BID) | ORAL | Status: DC
  Administered 2020-02-08 – 2020-02-11 (×3): 25 mg via ORAL
  Filled 2020-02-08 (×6): qty 1

## 2020-02-08 MED ORDER — GABAPENTIN 100 MG PO CAPS
200.0000 mg | ORAL_CAPSULE | Freq: Three times a day (TID) | ORAL | Status: DC
  Administered 2020-02-08 – 2020-02-23 (×44): 200 mg via ORAL
  Filled 2020-02-08 (×46): qty 2

## 2020-02-08 MED ORDER — SODIUM CHLORIDE 0.9 % IV SOLN
2.0000 g | Freq: Three times a day (TID) | INTRAVENOUS | Status: DC
  Administered 2020-02-08 – 2020-02-09 (×4): 2 g via INTRAVENOUS
  Filled 2020-02-08 (×5): qty 2

## 2020-02-08 MED ORDER — ACETAMINOPHEN 325 MG PO TABS: 650.0000 mg | ORAL_TABLET | Freq: Four times a day (QID) | ORAL | Status: DC | PRN

## 2020-02-08 MED ORDER — ALBUTEROL SULFATE HFA 108 (90 BASE) MCG/ACT IN AERS: 2.0000 | INHALATION_SPRAY | RESPIRATORY_TRACT | Status: DC | PRN

## 2020-02-08 MED ORDER — LACTATED RINGERS IV SOLN: INTRAVENOUS | Status: DC

## 2020-02-08 MED ORDER — SODIUM CHLORIDE 0.9% FLUSH
3.0000 mL | Freq: Two times a day (BID) | INTRAVENOUS | Status: DC
  Administered 2020-02-08 – 2020-02-23 (×31): 3 mL via INTRAVENOUS

## 2020-02-08 MED ORDER — SODIUM CHLORIDE 0.9 % IV SOLN
2.0000 g | Freq: Once | INTRAVENOUS | Status: AC
  Administered 2020-02-08: 2 g via INTRAVENOUS
  Filled 2020-02-08: qty 2

## 2020-02-08 MED ORDER — ENOXAPARIN SODIUM 40 MG/0.4ML ~~LOC~~ SOLN
40.0000 mg | SUBCUTANEOUS | Status: DC
  Administered 2020-02-08 – 2020-02-10 (×3): 40 mg via SUBCUTANEOUS
  Filled 2020-02-08 (×3): qty 0.4

## 2020-02-08 NOTE — Progress Notes (Signed)
Pharmacy Antibiotic Note  April [name] is a 64 y.o. female with PMH including renal transplant on anti-rejection therapy recently discharged 02/06/20 after sepsis attributed to mucopurulent bronchitis treated with IV antibiotics and transitioned to Augmentin (reports not taking PTA due side effect of diarrhea). Patient now admitted on 02/08/2020 with infection of unknown source.  Pharmacy has been consulted for vancomycin/cefepime dosing. Flagyl also ordered per MD. Tmax/24h 102.1, WBC wnl, LA 0.9. SCr 0.6 on admit.  Patient already given cefepime 2g IV x 1 and vancomycin 1500mg  IV x 1 in the ED earlier this AM.  Plan: Cefepime 2g IV q8h Vancomycin 1000 mg IV Q 24 hrs. Goal AUC 400-550. Expected AUC: 507 SCr used: 0.8 Flagyl 500mg  IV q8h Monitor clinical progress, c/s, renal function F/u de-escalation plan/LOT, vancomycin levels as indicated   Height: 5\' 2"  (157.5 cm) Weight: 166 lb 7.2 oz (75.5 kg) IBW/kg (Calculated) : 50.1  Temp (24hrs), Avg:101.6 F (38.7 C), Min:100.6 F (38.1 C), Max:102.1 F (38.9 C)  Recent Labs  Lab 02/01/20 0835 02/01/20 0848 02/03/20 0205 02/03/20 0205 02/04/20 0347 02/05/20 0318 02/06/20 1026 02/08/20 0435 02/08/20 0502 02/08/20 0541  WBC  --    < > 7.9  --  5.4 5.7 9.6 8.2  --   --   CREATININE  --    < > 0.93   < > 0.86 0.93 0.88 0.72  --  0.60  LATICACIDVEN 0.8  --   --   --   --   --   --   --  0.9  --    < > = values in this interval not displayed.    Estimated Creatinine Clearance: 69.4 mL/min (by C-G formula based on SCr of 0.6 mg/dL).    Allergies  Allergen Reactions  . Infed [Iron Dextran] Other (See Comments)    Chest tightness  . Pentamidine Itching, Shortness Of Breath and Swelling  . Erythromycin [Erythromycin] Other (See Comments)    Mouth Ulcers  . Iohexol Other (See Comments)    Per patient "she has had a kidney transplant and should never have contrast"  . Oxycodone Nausea Only and Nausea And Vomiting  .  Erythromycin Rash    Causes breakout in mouth  . Ultram [Tramadol Hcl] Anxiety    Antimicrobials this admission: 3/18 vancomycin >>  3/18 cefepime >>  3/18 flagyl x 1  Dose adjustments this admission:   Microbiology results: 3/18 BCx - sent   Arturo Morton, PharmD, BCPS Please check AMION for all [place] contact numbers Clinical Pharmacist 02/08/2020 9:28 AM

## 2020-02-08 NOTE — ED Notes (Signed)
Pt 1st Lactic Acid normal. 2nd Lactic Acid to be d/c.

## 2020-02-08 NOTE — Progress Notes (Addendum)
Prior to admission medications clarification  [place] in regards to patient's current transplant regimen. Confirmed patient takes the following:  -Tacrolimus 1 mg in AM and 0.5 mg in PM -Mycophenolate 180 mg BID -Prednisone 5 mg in AM and 2.5 mg in PM   Also determined that the patient's last tacrolimus level was 4.4 ng/mL and was drawn in March of 2019.    Claudina Lick, PharmD Candidate 02/08/2020; 11:51 AM

## 2020-02-08 NOTE — ED Triage Notes (Addendum)
Brought in by [facility] with c/o of SOB and nausea. Found by EMS to be 86% on RA. Placed on 4L [place] with improved sats to to 100%. Recently hospitalized on the 10th for sepsis. Pt was discharged home with PO antibiotics which she states she never took  because they gave her diarrhea. Hx kidney transplant in 2013. Denies CP Give 4 of IM zofran by EMS

## 2020-02-08 NOTE — ED Provider Notes (Signed)
[facility] EMERGENCY DEPARTMENT Provider Note  CSN: [id number] Arrival date & time: 02/08/20 0411  Chief Complaint(s) Shortness of Breath  HPI April [name] is a 64 y.o. female with a past medical history listed below including renal transplant patient on immunosuppressants who was discharged on March 16 after hospitalization for sepsis attributed to mucopurulent bronchitis treated with IV antibiotics and transition to Augmentin.  Since being discharged, patient reports that she has not been taking her antibiotics due to the side effects of diarrhea.  She reports that she was having a diarrhea during hospitalization which stopped after she stopped taking the medication.  She endorses nausea with mucus-like emesis.  She endorses worsening shortness of breath since being discharged.  This is what prompted her to call EMS.  EMS noted patient was hypoxic with sats of 86% on room air.  She was placed on 4 L nasal cannula.  Patient also noted to be febrile.   HPI  Past Medical History Past Medical History:  Diagnosis Date  . Arthritis   . Asthma   . CHF (congestive heart failure) ([place])   . Depression   . GERD (gastroesophageal reflux disease)   . Hyperlipidemia   . Hyperparathyroidism   . Polycystic kidney   . PONV (postoperative nausea and vomiting)    Patient Active Problem List   Diagnosis Date Noted  . Sepsis ([place]) 01/31/2020  . Hypoxia   . Shortness of breath   . Fracture of left superior pubic ramus ([facility])   . Pain   . Anxiety state   . Left displaced femoral neck fracture ([place]) 12/15/2019  . Status post total hip replacement, left   . Post-op pain   . Acute blood loss anemia   . Polycystic kidney   . Closed left hip fracture ([place]) 12/07/2019  . Left wrist fracture 12/07/2019  . Medication management 09/22/2019  . Physical deconditioning 09/22/2019  . Hyponatremia 06/29/2019  . Acute respiratory failure with hypoxia ([place]) 04/19/2019  . Acute on  chronic diastolic CHF (congestive heart failure) ([place]) 04/19/2019  . Bilateral closed proximal tibial fracture 12/21/2018  . Closed right ankle fracture 12/21/2018  . Fracture 12/21/2018  . Lower urinary tract infectious disease 10/03/2018  . Asthma, chronic, unspecified asthma severity, with acute exacerbation 10/03/2018  . SIRS (systemic inflammatory response syndrome) ([place]) 10/03/2018  . Nausea vomiting and diarrhea 10/02/2018  . Chronic diastolic CHF (congestive heart failure) ([place]) 10/01/2017  . Influenza B 10/01/2017  . Persistent asthma with status asthmaticus   . Pneumonia 07/15/2016  . Asthma, mild intermittent 07/15/2016  . GERD (gastroesophageal reflux disease) 07/15/2016  . Renal transplant recipient 07/15/2016  . Hyperlipidemia 07/15/2016  . Depression 07/15/2016  . Bronchitis, mucopurulent recurrent ([place]) 08/08/2014  . Chronic cough 07/24/2014  . End stage renal disease ([place]) 03/23/2012  . 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 Authorizing Provider  acetaminophen (TYLENOL) 325 MG tablet Take 650 mg by mouth every 6 (six) hours as needed for mild pain or headache.   Yes [provider]  albuterol (VENTOLIN HFA) 108 (90 Base) MCG/ACT inhaler Inhale 2 puffs into the lungs every 4 (four) hours as needed for wheezing or shortness of breath. 01/04/20  Yes Angiulli, Lavon Paganini, PA-C  allopurinol (ZYLOPRIM) 300 MG tablet Take 1 tablet (300 mg total) by mouth daily. 01/04/20  Yes Angiulli, Lavon Paganini, PA-C  amoxicillin-clavulanate (  AUGMENTIN) 875-125 MG tablet Take 1 tablet by mouth 2 (two) times daily for 5 days. 02/06/20 02/11/20 Yes Sheikh, Omair Latif, DO  aspirin 81 MG chewable tablet Chew 81 mg by mouth daily.   Yes [provider]  brimonidine (ALPHAGAN P) 0.1 % SOLN Place 1 drop 2 (two) times daily into the left eye.   Yes  [provider]  calcium carbonate (TUMS - DOSED IN MG ELEMENTAL CALCIUM) 500 MG chewable tablet Chew 2 tablets (400 mg of elemental calcium total) by mouth 4 (four) times daily as needed for indigestion or heartburn. 01/04/20  Yes Angiulli, Lavon Paganini, PA-C  cholecalciferol (VITAMIN D3) 25 MCG (1000 UNIT) tablet Take 1 tablet (1,000 Units total) by mouth daily. 01/04/20  Yes Angiulli, Lavon Paganini, PA-C  cycloSPORINE (RESTASIS) 0.05 % ophthalmic emulsion Place 1 drop into both eyes 2 (two) times daily. 01/04/20  Yes Angiulli, Lavon Paganini, PA-C  docusate sodium (COLACE) 100 MG capsule Take 1 capsule (100 mg total) by mouth 2 (two) times daily. Patient taking differently: Take 100 mg by mouth 2 (two) times daily as needed for mild constipation.  12/13/19  Yes Shelly Coss, MD  dorzolamide (TRUSOPT) 2 % ophthalmic solution Place 1 drop into both eyes daily. 06/25/19  Yes [provider]  FLUoxetine (PROZAC) 40 MG capsule Take 1 capsule (40 mg total) by mouth daily. 01/04/20  Yes Angiulli, Lavon Paganini, PA-C  furosemide (LASIX) 40 MG tablet Take 1 tablet (40 mg total) by mouth daily. 02/07/20  Yes Sheikh, Omair Latif, DO  gabapentin (NEURONTIN) 100 MG capsule Take 200 mg by mouth 3 (three) times daily.   Yes [provider]  guaiFENesin (MUCINEX) 600 MG 12 hr tablet Take 1 tablet (600 mg total) by mouth 2 (two) times daily. 02/06/20  Yes Sheikh, Omair Latif, DO  HYDROcodone-acetaminophen (NORCO/VICODIN) 5-325 MG tablet Take 1 tablet by mouth every 4 (four) hours as needed for severe pain. 01/04/20  Yes Angiulli, Lavon Paganini, PA-C  lidocaine (LIDODERM) 5 % Place 1 patch onto the skin daily. Remove & Discard patch within 12 hours or as directed by MD 02/07/20  Yes Raiford Noble Latif, DO  magnesium oxide (MAG-OX) 400 MG tablet Take 1 tablet (400 mg total) by mouth 2 (two) times daily. 01/04/20  Yes Angiulli, Lavon Paganini, PA-C  methocarbamol (ROBAXIN) 500 MG tablet Take 1 tablet (500 mg total) by mouth 4  (four) times daily. 01/04/20  Yes Angiulli, Lavon Paganini, PA-C  metoprolol tartrate (LOPRESSOR) 25 MG tablet Take 1 tablet (25 mg total) by mouth 2 (two) times daily. 02/06/20  Yes Sheikh, Omair Latif, DO  mycophenolate (MYFORTIC) 180 MG EC tablet Take 180 mg by mouth 2 (two) times daily.   Yes [provider]  pantoprazole (PROTONIX) 40 MG tablet Take 1 tablet (40 mg total) by mouth 2 (two) times daily. 01/04/20  Yes Angiulli, Lavon Paganini, PA-C  polyethylene glycol (MIRALAX / GLYCOLAX) 17 g packet Take 17 g by mouth daily as needed for mild constipation. 12/13/19  Yes Shelly Coss, MD  predniSONE (DELTASONE) 5 MG tablet Take 2.5-5 mg by mouth See admin instructions. 5 mg in the morning, 2.5 mg in the evening   Yes [provider]  PROGRAF 1 MG capsule Take 1 mg by mouth daily. 03/22/19  Yes [provider]  saccharomyces boulardii (FLORASTOR) 250 MG capsule Take 1 capsule (250 mg total) by mouth 2 (two) times daily. 02/06/20  Yes Sheikh, Omair Latif, DO  sodium chloride (OCEAN) 0.65 %  SOLN nasal spray Place 1 spray into both nostrils as needed for congestion. 02/06/20  Yes Sheikh, Omair Latif, DO  SYMBICORT 160-4.5 MCG/ACT inhaler Inhale 2 puffs into the lungs 2 (two) times daily. 01/04/20  Yes Angiulli, Lavon Paganini, PA-C  tacrolimus (PROGRAF) 0.5 MG capsule Take 0.5 mg by mouth at bedtime. Takes brand name Prograf   Yes [provider]  TRAVATAN Z 0.004 % SOLN ophthalmic solution Place 1 drop into both eyes at bedtime. 07/03/16  Yes [provider]  vitamin B-12 (CYANOCOBALAMIN) 100 MCG tablet Take 100 mcg by mouth daily.   Yes [provider]                                                                                                                                    Past Surgical History Past Surgical History:  Procedure Laterality Date  . 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 BILATERAL OOPHORECTOMY    . CARPAL TUNNEL RELEASE   2000  . CESAREAN SECTION    . INSERTION OF DIALYSIS CATHETER  03/31/2012   Procedure: INSERTION OF DIALYSIS CATHETER;  Surgeon: Angelia Mould, MD;  Location: [facility];  Service: Vascular;  Laterality: N/A;  insertion of dialysis catheter right internal jugular vein  . 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 ORIF TIBIA FRACTURE Left 12/23/2018   Procedure: OPEN REDUCTION INTERNAL FIXATION (ORIF) TIBIA FRACTURE;  Surgeon: Shona Needles, MD;  Location: [facility];  Service: Orthopedics;  Laterality: Left;  . ORIF TIBIA PLATEAU Right 12/23/2018   Procedure: OPEN REDUCTION INTERNAL FIXATION (ORIF) TIBIAL PLATEAU;  Surgeon: Shona Needles, MD;  Location: [facility];  Service: Orthopedics;  Laterality: Right;  . ORIF WRIST FRACTURE Left 12/08/2019   Procedure: OPEN REDUCTION INTERNAL FIXATION (ORIF) WRIST FRACTURE, REPAIR LACERATION LEFT FOREARM;  Surgeon: Dorna Leitz, MD;  Location: [facility];  Service: Orthopedics;  Laterality: Left;  . TONSILLECTOMY  1967  . TOTAL HIP ARTHROPLASTY Left 12/08/2019   Procedure: TOTAL HIP ARTHROPLASTY ANTERIOR APPROACH;  Surgeon: Dorna Leitz, MD;  Location: [facility];  Service: Orthopedics;  Laterality: Left;  . TUBAL LIGATION  2010   Family History Family History  Problem Relation Age of Onset  . Hypertension Mother   . Heart disease Father        CABG history  . Polycystic kidney disease Father   . Polycystic kidney disease Brother     Social History Social History   Tobacco Use  . Smoking status: Former Smoker    Packs/day: 0.25    Years: 15.00    Pack years: 3.75    Types: Cigarettes    Quit date: 03/22/1998    Years since quitting: 21.8  . Smokeless tobacco: Never Used  Substance Use Topics  . Alcohol use: No    Comment: social  . Drug use: No   Allergies Infed [iron dextran], Pentamidine, Erythromycin [  erythromycin], Iohexol, Oxycodone, Erythromycin, and Ultram [tramadol hcl]  Review of Systems Review of Systems All other systems are reviewed and are  negative for acute change except as noted in the HPI  Physical Exam Vital Signs  I have reviewed the triage vital signs BP (!) 148/67   Pulse (!) 102   Temp (!) 102.1 F (38.9 C) (Oral)   Resp (!) 24   SpO2 100%   Physical Exam Vitals reviewed.  Constitutional:      General: She is not in acute distress.    Appearance: She is well-developed. She is not diaphoretic.     Interventions: Nasal cannula in place.  HENT:     Head: Normocephalic and atraumatic.     Nose: Nose normal.  Eyes:     General: No scleral icterus.       Right eye: No discharge.        Left eye: No discharge.     Conjunctiva/sclera: Conjunctivae normal.     Pupils: Pupils are equal, round, and reactive to light.  Cardiovascular:     Rate and Rhythm: Regular rhythm. Tachycardia present.     Heart sounds: No murmur. No friction rub. No gallop.   Pulmonary:     Effort: Pulmonary effort is normal. Tachypnea present. No respiratory distress.     Breath sounds: No stridor. Examination of the right-middle field reveals rhonchi. Examination of the left-middle field reveals rhonchi. Examination of the right-lower field reveals rhonchi. Examination of the left-lower field reveals rhonchi. Rhonchi present. No rales.  Abdominal:     General: There is no distension.     Palpations: Abdomen is soft.     Tenderness: There is no abdominal tenderness.  Musculoskeletal:        General: No tenderness.     Cervical back: Normal range of motion and neck supple.  Skin:    General: Skin is warm and dry.     Findings: No erythema or rash.  Neurological:     Mental Status: She is alert and oriented to person, place, and time.     ED Results and Treatments Labs (all labs ordered are listed, but only abnormal results are displayed) Labs Reviewed  COMPREHENSIVE METABOLIC PANEL - Abnormal; Notable for the following components:      Result Value   Glucose, Bld 102 (*)    Albumin 2.7 (*)    Total Bilirubin 1.6 (*)    All  other components within normal limits  CBC WITH DIFFERENTIAL/PLATELET - Abnormal; Notable for the following components:   RBC 3.19 (*)    Hemoglobin 8.9 (*)    HCT 31.6 (*)    MCHC 28.2 (*)    RDW 21.8 (*)    Abs Immature Granulocytes 0.12 (*)    All other components within normal limits  PROTIME-INR - Abnormal; Notable for the following components:   Prothrombin Time 15.5 (*)    All other components within normal limits  I-STAT CHEM 8, ED - Abnormal; Notable for the following components:   Sodium 134 (*)    Hemoglobin 9.9 (*)    HCT 29.0 (*)    All other components within normal limits  CULTURE, BLOOD (ROUTINE X 2)  CULTURE, BLOOD (ROUTINE X 2)  URINE CULTURE  LACTIC ACID, PLASMA  APTT  LACTIC ACID, PLASMA  URINALYSIS, ROUTINE W REFLEX MICROSCOPIC  BRAIN NATRIURETIC PEPTIDE  EKG  EKG Interpretation  Date/Time:  Thursday February 08 2020 04:18:03 EDT Ventricular Rate:  103 PR Interval:    QRS Duration: 86 QT Interval:  337 QTC Calculation: 442 R Axis:   67 Text Interpretation: Sinus tachycardia No acute changes Confirmed by Addison Lank [phone]) on 02/08/2020 5:40:35 AM      Radiology DG Chest Port 1 View  Result Date: 02/08/2020 CLINICAL DATA:  Fever EXAM: PORTABLE CHEST 1 VIEW COMPARISON:  Three days ago FINDINGS: Minimal linear opacity that is stable and compatible with scarring. Stable low lung volumes. Normal heart size. There is no edema, consolidation, effusion, or pneumothorax. IMPRESSION: Stable from 3 days ago.  No focal pneumonia. Electronically Signed   By: Monte Fantasia M.D.   On: 02/08/2020 04:52    Pertinent labs & imaging results that were available during my care of the patient were reviewed by me and considered in my medical decision making (see chart for details).  Medications Ordered in ED Medications  vancomycin (VANCOREADY) IVPB  1500 mg/300 mL (1,500 mg Intravenous New Bag/Given 02/08/20 0541)  ceFEPIme (MAXIPIME) 2 g in sodium chloride 0.9 % 100 mL IVPB (0 g Intravenous Stopped 02/08/20 0540)  acetaminophen (TYLENOL) tablet 1,000 mg (1,000 mg Oral Given 02/08/20 0502)  metoCLOPramide (REGLAN) injection 5 mg (5 mg Intravenous Given 02/08/20 0541)                                                                                                                                    Procedures .1-3 Lead EKG Interpretation Performed by: Fatima Blank, MD Authorized by: Fatima Blank, MD     Interpretation: normal     ECG rate:  105   ECG rate assessment: normal     Rhythm: sinus rhythm     Ectopy: none     Conduction: normal   .Critical Care Performed by: Fatima Blank, MD Authorized by: Fatima Blank, MD     CRITICAL CARE Performed by: Grayce Sessions Dionisia Pacholski Total critical care time: 35 minutes Critical care time was exclusive of separately billable procedures and treating other patients. Critical care was necessary to treat or prevent imminent or life-threatening deterioration. Critical care was time spent personally by me on the following activities: development of treatment plan with patient and/or surrogate as well as nursing, discussions with consultants, evaluation of patient's response to treatment, examination of patient, obtaining history from patient or surrogate, ordering and performing treatments and interventions, ordering and review of laboratory studies, ordering and review of radiographic studies, pulse oximetry and re-evaluation of patient's condition.   (including critical care time)  Medical Decision Making / ED Course I have reviewed the nursing notes for this encounter and the patient's prior records (if available in EHR or on provided paperwork).   April [name] was evaluated in Emergency Department on 02/08/2020 for the symptoms described in the history of  present illness. She was  evaluated in the context of the global COVID-19 pandemic, which necessitated consideration that the patient might be at risk for infection with the SARS-CoV-2 virus that causes COVID-19. Institutional protocols and algorithms that pertain to the evaluation of patients at risk for COVID-19 are in a state of rapid change based on information released by regulatory bodies including the CDC and federal and state organizations. These policies and algorithms were followed during the patient's care in the ED.   Patient is febrile and tachycardic.  Known infection from previous admission for sepsis.  Code sepsis initiated.  Given the patient's history of heart failure and need for diuresing, IV fluids were held.  Her lactic acid is normal and does not require 30 cc/kg of IV fluids.  Empiric antibiotics were initiated.  Given the patient's hypoxia, patient will require readmission to the hospital for continued management.      Final Clinical Impression(s) / ED Diagnoses Final diagnoses:  Fever in other diseases  Hypoxia  Bronchitis      This chart was dictated using voice recognition software.  Despite best efforts to proofread,  errors can occur which can change the documentation meaning.   Fatima Blank, MD 02/08/20 [phone]

## 2020-02-08 NOTE — H&P (Addendum)
History and Physical    April [name] ZOX:[id number] DOB: 1956/11/10 DOA: 02/08/2020  PCP: [name], MD Consultants:  Ranell Patrick - PM&R; Berenice Primas - orthopedics; [facility] - pulmonology; [facility] - oncology Patient coming from:  Home - lives with husband, mother, son; NOK: [place], [place], [phone]  Chief Complaint: SOB/nausea  HPI: April [name] is a 64 y.o. female with medical history significant of PCKD s/p renal transplant; HLD; anal condyloma; and CHF presenting with SOB/nausea.  She was previously hospitalized from 3/10-16 for sepsis of uncertain etiology in a transplant recipient - possibly from mucopurulent bronchitis.  She had acute respiratory failure with hypoxia.  ID was involved and she was treated with broad spectrum antibiotics and transitioned to IV ceftriaxone.  She was recommended to go to CIR but declined and went home with home health services.  She felt good when she went home - except that she was having horrible burning diarrhea "from the antibiotics."  That got worse and worse and she couldn't tolerate it anymore.  She didn't have as much help at home to help her clean up.  She has been coughing and dry heaving.  Her last BM was yesterday.  Overnight, she started having a hard time breathing and couldn't quit dry heaving.  She had recurrent fever at home up to 100.4.  She is no longer still having cough.  No urinary symptoms.    She fell in January - hip and pelvic fractures as well as wrist fracture.  She went to CIR but was in too much pain to be successful.  She is now bed bound.  Every time she starts to get more mobile, she has a setback or illness. They recommended return to CIR after last hospitalization.  She declined because she didn't have the strength to keep up with the program.  She refused a SNF rehab due to her immunosuppression.  She continues to feel the same about disposition.   ED Course:  Carryover, per Dr. Hal Hope:  Renal transplant  patient was recently admitted for sepsis and also likely from mucopurulent bronchitis discharged 2 days ago presents back with fever chills again source not clear Covid test is pending started on empiric antibiotics admitted for further management of sepsis.  Review of Systems: As per HPI; otherwise review of systems reviewed and negative.   Ambulatory Status:  Currently nonambulatory  Past Medical History:  Diagnosis Date  . Arthritis   . Asthma   . CHF (congestive heart failure) ([place])   . Depression   . Essential hypertension 02/08/2020  . GERD (gastroesophageal reflux disease)   . Hyperlipidemia   . Hyperparathyroidism   . Polycystic kidney   . PONV (postoperative nausea and vomiting)     Past Surgical History:  Procedure Laterality Date  . 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 BILATERAL OOPHORECTOMY    . CARPAL TUNNEL RELEASE  2000  . CESAREAN SECTION    . INSERTION OF DIALYSIS CATHETER  03/31/2012   Procedure: INSERTION OF DIALYSIS CATHETER;  Surgeon: Angelia Mould, MD;  Location: [place];  Service: Vascular;  Laterality: N/A;  insertion of dialysis catheter right internal jugular vein  . 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 ORIF TIBIA FRACTURE Left 12/23/2018   Procedure: OPEN REDUCTION INTERNAL FIXATION (ORIF) TIBIA FRACTURE;  Surgeon: Shona Needles, MD;  Location: [place];  Service: Orthopedics;  Laterality: Left;  . ORIF TIBIA PLATEAU Right 12/23/2018   Procedure: OPEN  REDUCTION INTERNAL FIXATION (ORIF) TIBIAL PLATEAU;  Surgeon: Shona Needles, MD;  Location: [place];  Service: Orthopedics;  Laterality: Right;  . ORIF WRIST FRACTURE Left 12/08/2019   Procedure: OPEN REDUCTION INTERNAL FIXATION (ORIF) WRIST FRACTURE, REPAIR LACERATION LEFT FOREARM;  Surgeon: Dorna Leitz, MD;  Location: [facility];  Service: Orthopedics;  Laterality: Left;  . TONSILLECTOMY  1967  . TOTAL HIP ARTHROPLASTY Left 12/08/2019   Procedure: TOTAL HIP ARTHROPLASTY ANTERIOR  APPROACH;  Surgeon: Dorna Leitz, MD;  Location: [facility];  Service: Orthopedics;  Laterality: Left;  . TUBAL LIGATION  2010    Social History   Socioeconomic History  . Marital status: Married    Spouse name: Not on file  . Number of children: Not on file  . Years of education: Not on file  . Highest education level: Not on file  Occupational History  . Occupation: retired  Tobacco Use  . Smoking status: Former Smoker    Packs/day: 0.25    Years: 15.00    Pack years: 3.75    Types: Cigarettes    Quit date: 03/22/1998    Years since quitting: 21.8  . Smokeless tobacco: Never Used  Substance and Sexual Activity  . Alcohol use: No    Comment: social  . Drug use: No  . Sexual activity: Not on file  Other Topics Concern  . Not on file  Social History Narrative   Married, lives with spouse, son and mother   2 children > son and daughter   OCCUPATION: retired Education officer, museum   Social Determinants of Radio broadcast assistant Strain:   . Difficulty of Paying Living Expenses:   Food Insecurity:   . Worried About Charity fundraiser in the Last Year:   . Arboriculturist in the Last Year:   Transportation Needs:   . Film/video editor (Medical):   [name] Lack of Transportation (Non-Medical):   Physical Activity:   . Days of Exercise per Week:   . Minutes of Exercise per Session:   Stress:   . Feeling of Stress :   Social Connections:   . Frequency of Communication with Friends and Family:   . Frequency of Social Gatherings with Friends and Family:   . Attends Religious Services:   . Active Member of Clubs or Organizations:   . Attends Archivist Meetings:   [name] Marital Status:   Intimate Partner Violence:   . Fear of Current or Ex-Partner:   . Emotionally Abused:   [name] Physically Abused:   . Sexually Abused:     Allergies  Allergen Reactions  . Infed [Iron Dextran] Other (See Comments)    Chest tightness  . Pentamidine Itching, Shortness Of Breath and  Swelling  . Erythromycin [Erythromycin] Other (See Comments)    Mouth Ulcers  . Iohexol Other (See Comments)    Per patient "she has had a kidney transplant and should never have contrast"  . Oxycodone Nausea Only and Nausea And Vomiting  . Erythromycin Rash    Causes breakout in mouth  . Ultram [Tramadol Hcl] Anxiety    Family History  Problem Relation Age of Onset  . Hypertension Mother   . Heart disease Father        CABG history  . Polycystic kidney disease Father   . Polycystic kidney disease Brother     Prior to Admission medications   Medication Sig Start Date End Date Taking? Authorizing Provider  acetaminophen (TYLENOL) 325 MG tablet  Take 650 mg by mouth every 6 (six) hours as needed for mild pain or headache.   Yes [provider]  albuterol (VENTOLIN HFA) 108 (90 Base) MCG/ACT inhaler Inhale 2 puffs into the lungs every 4 (four) hours as needed for wheezing or shortness of breath. 01/04/20  Yes Angiulli, Lavon Paganini, PA-C  allopurinol (ZYLOPRIM) 300 MG tablet Take 1 tablet (300 mg total) by mouth daily. 01/04/20  Yes Angiulli, Lavon Paganini, PA-C  amoxicillin-clavulanate (AUGMENTIN) 875-125 MG tablet Take 1 tablet by mouth 2 (two) times daily for 5 days. 02/06/20 02/11/20 Yes Sheikh, Omair Latif, DO  aspirin 81 MG chewable tablet Chew 81 mg by mouth daily.   Yes [provider]  brimonidine (ALPHAGAN P) 0.1 % SOLN Place 1 drop 2 (two) times daily into the left eye.   Yes [provider]  calcium carbonate (TUMS - DOSED IN MG ELEMENTAL CALCIUM) 500 MG chewable tablet Chew 2 tablets (400 mg of elemental calcium total) by mouth 4 (four) times daily as needed for indigestion or heartburn. 01/04/20  Yes Angiulli, Lavon Paganini, PA-C  cholecalciferol (VITAMIN D3) 25 MCG (1000 UNIT) tablet Take 1 tablet (1,000 Units total) by mouth daily. 01/04/20  Yes Angiulli, Lavon Paganini, PA-C  cycloSPORINE (RESTASIS) 0.05 % ophthalmic emulsion Place 1 drop into both eyes 2 (two) times  daily. 01/04/20  Yes Angiulli, Lavon Paganini, PA-C  docusate sodium (COLACE) 100 MG capsule Take 1 capsule (100 mg total) by mouth 2 (two) times daily. Patient taking differently: Take 100 mg by mouth 2 (two) times daily as needed for mild constipation.  12/13/19  Yes Shelly Coss, MD  dorzolamide (TRUSOPT) 2 % ophthalmic solution Place 1 drop into both eyes daily. 06/25/19  Yes [provider]  FLUoxetine (PROZAC) 40 MG capsule Take 1 capsule (40 mg total) by mouth daily. 01/04/20  Yes Angiulli, Lavon Paganini, PA-C  furosemide (LASIX) 40 MG tablet Take 1 tablet (40 mg total) by mouth daily. 02/07/20  Yes Sheikh, Omair Latif, DO  gabapentin (NEURONTIN) 100 MG capsule Take 200 mg by mouth 3 (three) times daily.   Yes [provider]  guaiFENesin (MUCINEX) 600 MG 12 hr tablet Take 1 tablet (600 mg total) by mouth 2 (two) times daily. 02/06/20  Yes Sheikh, Omair Latif, DO  HYDROcodone-acetaminophen (NORCO/VICODIN) 5-325 MG tablet Take 1 tablet by mouth every 4 (four) hours as needed for severe pain. 01/04/20  Yes Angiulli, Lavon Paganini, PA-C  lidocaine (LIDODERM) 5 % Place 1 patch onto the skin daily. Remove & Discard patch within 12 hours or as directed by MD 02/07/20  Yes Raiford Noble Latif, DO  magnesium oxide (MAG-OX) 400 MG tablet Take 1 tablet (400 mg total) by mouth 2 (two) times daily. 01/04/20  Yes Angiulli, Lavon Paganini, PA-C  methocarbamol (ROBAXIN) 500 MG tablet Take 1 tablet (500 mg total) by mouth 4 (four) times daily. 01/04/20  Yes Angiulli, Lavon Paganini, PA-C  metoprolol tartrate (LOPRESSOR) 25 MG tablet Take 1 tablet (25 mg total) by mouth 2 (two) times daily. 02/06/20  Yes Sheikh, Omair Latif, DO  mycophenolate (MYFORTIC) 180 MG EC tablet Take 180 mg by mouth 2 (two) times daily.   Yes [provider]  pantoprazole (PROTONIX) 40 MG tablet Take 1 tablet (40 mg total) by mouth 2 (two) times daily. 01/04/20  Yes Angiulli, Lavon Paganini, PA-C  polyethylene glycol (MIRALAX / GLYCOLAX) 17 g packet  Take 17 g by mouth daily as needed for mild constipation. 12/13/19  Yes Adhikari, Tamsen Meek,  MD  predniSONE (DELTASONE) 5 MG tablet Take 2.5-5 mg by mouth See admin instructions. 5 mg in the morning, 2.5 mg in the evening   Yes [provider]  PROGRAF 1 MG capsule Take 1 mg by mouth daily. 03/22/19  Yes [provider]  saccharomyces boulardii (FLORASTOR) 250 MG capsule Take 1 capsule (250 mg total) by mouth 2 (two) times daily. 02/06/20  Yes Sheikh, Omair Latif, DO  sodium chloride (OCEAN) 0.65 % SOLN nasal spray Place 1 spray into both nostrils as needed for congestion. 02/06/20  Yes Sheikh, Omair Latif, DO  SYMBICORT 160-4.5 MCG/ACT inhaler Inhale 2 puffs into the lungs 2 (two) times daily. 01/04/20  Yes Angiulli, Lavon Paganini, PA-C  tacrolimus (PROGRAF) 0.5 MG capsule Take 0.5 mg by mouth at bedtime. Takes brand name Prograf   Yes [provider]  TRAVATAN Z 0.004 % SOLN ophthalmic solution Place 1 drop into both eyes at bedtime. 07/03/16  Yes [provider]  vitamin B-12 (CYANOCOBALAMIN) 100 MCG tablet Take 100 mcg by mouth daily.   Yes [provider]    Physical Exam: Vitals:   02/08/20 1415 02/08/20 1445 02/08/20 1449 02/08/20 1556  BP: (!) 123/49 (!) 116/47  (!) 132/59  Pulse: (!) 110 (!) 101  95  Resp: (!) 27 (!) 25  20  Temp:   99.6 F (37.6 C) 98.6 F (37 C)  TempSrc:   Oral Oral  SpO2: 92% 99%  100%  Weight:      Height:         . General:  Appears calm and comfortable and is NAD . Eyes:  PERRL, EOMI, normal lids, iris . ENT:  grossly normal hearing, lips & tongue, mmm; appropriate dentition . Neck:  no LAD, masses or thyromegaly; no carotid bruits . Cardiovascular:  RRR, no m/r/g. No LE edema.  [name] Respiratory:   CTA bilaterally with no wheezes/rales/rhonchi.  Normal respiratory effort. . Abdomen:  soft, NT, ND, NABS . Back:   normal alignment, no CVAT . Skin:  no rash or induration seen on limited exam . Musculoskeletal:  grossly  normal tone BUE/BLE, good ROM, no bony abnormality . Lower extremity:  No LE edema.  Limited foot exam with no ulcerations.  2+ distal pulses. [name] Psychiatric:  grossly normal mood and affect, speech fluent and appropriate, AOx3 . Neurologic:  CN 2-12 grossly intact, moves all extremities in coordinated fashion, sensation intact    Radiological Exams on Admission: DG Chest Port 1 View  Result Date: 02/08/2020 CLINICAL DATA:  Fever EXAM: PORTABLE CHEST 1 VIEW COMPARISON:  Three days ago FINDINGS: Minimal linear opacity that is stable and compatible with scarring. Stable low lung volumes. Normal heart size. There is no edema, consolidation, effusion, or pneumothorax. IMPRESSION: Stable from 3 days ago.  No focal pneumonia. Electronically Signed   By: Monte Fantasia M.D.   On: 02/08/2020 04:52    EKG: Independently reviewed.  Sinus tachycardia with rate 103; no evidence of acute ischemia   Labs on Admission: I have personally reviewed the available labs and imaging studies at the time of the admission.  Pertinent labs:   Glucose 102 Albumin 2.7 Bili 1.6 BNP 459.3 Lactate 0.9 WBC 8.2 Hgb 8.9 INR 1.2 Blood and urine cultures pending UA pending COVID negative Procalcitonin 0.34   Assessment/Plan Principal Problem:   Sepsis ([place]) Active Problems:   Renal transplant recipient   Chronic diastolic CHF (congestive heart failure) (H[facility])   Physical deconditioning   Essential hypertension  Glaucoma   Sepsis in an immunocompromised patient, uncertain etiology -Patient with recent hospitalization, discharged on 3/16 after sepsis from uncertain source -She returned with fever, tachycardia, tachypnea concerning for recurrent sepsis -This is complicated by her immunocompromise -ID has been consulted for further assistance -She did develop severe diarrhea with Augmentin but this appears to have abated -Sepsis protocol initiated -Blood and urine cultures pending -Will admit due to:  failure of outpatient treatment; immunocompromise -Treat with IV Cefepime monotherapy for now, per ID -Will increase to stress-dosed steroids (hydrocortisone 50 mg IV q6h) for now  PCKD s/p renal transplantation -Continue immunosuppressants including mycophenolate and tacrolimus -Will give stress-dosed steroids as above  HTN -Continue Lopressor  Chronic diastolic CHF -Appears to be compensated at this time  Glaucoma  -Continue home drops  Debility -Since her fall in January, she has been essentially bed bound -She is quite deconditioned and this is unlikely to improve her long-term outcomes -PT/OT consults requested -Patient is encouraged to get OOB to chair at least q shift -She is unwilling to go to CIR because she feels unable to effectively participate for 3 hours a day -She is unwilling to go to SNF due to her immunocompromise -Will order palliative care consultation    Note: This patient has been tested and is negative for the novel coronavirus COVID-19.    DVT prophylaxis:  Lovenox Code Status:  Full - confirmed with patient Family Communication: None present Disposition Plan:  Home once clinically improved Consults called: ID; palliative care; PT/OT Admission status: Admit - It is my clinical opinion that admission to INPATIENT is reasonable and necessary because of the expectation that this patient will require hospital care that crosses at least 2 midnights to treat this condition based on the medical complexity of the problems presented.  Given the aforementioned information, the predictability of an adverse outcome is felt to be significant.   Karmen Bongo MD [facility]   How to contact the [facility] Attending or Consulting provider [place] or covering provider during after hours [place], for this patient?  1. Check the care team in [facility] and look for a) attending/consulting TRH provider listed and b) the [facility] team listed 2. Log into [email or web address] and use [facility]'s universal password to access. If you do not have the password, please contact the hospital operator. 3. Locate the [facility] provider you are looking for under [facility] and page to a number that you can be directly reached. 4. If you still have difficulty reaching the provider, please page the [facility] (Director on Call) for the Hospitalists listed on amion for assistance.   02/08/2020, 7:45 PM

## 2020-02-08 NOTE — Consult Note (Signed)
[place] for Infectious Disease       Reason for Consult: fever    Referring Physician: Dr. Lorin Mercy  Active Problems:   Sepsis ([place])   . allopurinol  300 mg Oral Daily  . aspirin  81 mg Oral Daily  . brimonidine  1 drop Left Eye BID  . cycloSPORINE  1 drop Both Eyes BID  . dorzolamide  1 drop Both Eyes Daily  . enoxaparin (LOVENOX) injection  40 mg Subcutaneous Q24H  . FLUoxetine  40 mg Oral Daily  . gabapentin  200 mg Oral TID  . guaiFENesin  600 mg Oral BID  . hydrocortisone sod succinate (SOLU-CORTEF) inj  50 mg Intravenous Q6H  . latanoprost  1 drop Both Eyes QHS  . methocarbamol  500 mg Oral QID  . metoprolol tartrate  25 mg Oral BID  . mometasone-formoterol  2 puff Inhalation BID  . mycophenolate  180 mg Oral BID  . pantoprazole  40 mg Oral BID  . saccharomyces boulardii  250 mg Oral BID  . sodium chloride flush  3 mL Intravenous Q12H  . tacrolimus  0.5 mg Oral QHS  . tacrolimus  1 mg Oral Daily    Recommendations:  continue cefepime for now Will stop vancomycin and metronidazole I agree with steroids for possible adrenal insufficiency  Assessment: She has a fever with no known source.  Her only symptomatic change is subjective SOB but no new cough, no dysuria, some diarrhea but improved off of Augmentin.  Differential includes infection, medications, adrenal insufficiency from recent steroid use.  She is otherwise hypertensive with a normal lactate and normal procalcitonin.    Antibiotics: Vancomycin, cefepime, metronidazole  HPI: April [name] is a 64 y.o. female with a history of PCKD with a kidney transplant done in 2013 who came in with fever.  She noted a fever last night and febrile here to 102.1.  Some increased sob but same, non-productive cough.  No dysuria, no pyuria, no chills.  Some diarrhea recently with the Augmentin. She was discharged 3 days ago after hospitalization for fever and thought to have purulent bronchitis.  CXR did not show  any particular new issues then and is the same now.     Review of Systems:  Constitutional: positive for fevers and anorexia Respiratory: positive for dypsnea, negative for sputum Gastrointestinal: positive for nausea, vomiting and diarrhea, negative for abdominal pain Genitourinary: negative for frequency, dysuria and hematuria Musculoskeletal: negative for myalgias and arthralgias All other systems reviewed and are negative    Past Medical History:  Diagnosis Date  . Arthritis   . Asthma   . CHF (congestive heart failure) ([place])   . Depression   . GERD (gastroesophageal reflux disease)   . Hyperlipidemia   . Hyperparathyroidism   . Polycystic kidney   . PONV (postoperative nausea and vomiting)     Social History   Tobacco Use  . Smoking status: Former Smoker    Packs/day: 0.25    Years: 15.00    Pack years: 3.75    Types: Cigarettes    Quit date: 03/22/1998    Years since quitting: 21.8  . Smokeless tobacco: Never Used  Substance Use Topics  . Alcohol use: No    Comment: social  . Drug use: No    Family History  Problem Relation Age of Onset  . Hypertension Mother   . Heart disease Father        CABG history  . Polycystic kidney disease  Father   . Polycystic kidney disease Brother     Allergies  Allergen Reactions  . Infed [Iron Dextran] Other (See Comments)    Chest tightness  . Pentamidine Itching, Shortness Of Breath and Swelling  . Erythromycin [Erythromycin] Other (See Comments)    Mouth Ulcers  . Iohexol Other (See Comments)    Per patient "she has had a kidney transplant and should never have contrast"  . Oxycodone Nausea Only and Nausea And Vomiting  . Erythromycin Rash    Causes breakout in mouth  . Ultram [Tramadol Hcl] Anxiety    Physical Exam: Constitutional: in no apparent distress  Vitals:   02/08/20 0845 02/08/20 0900  BP: (!) 102/36 (!) 101/46  Pulse: 95 98  Resp: (!) 22 (!) 26  Temp:    SpO2: 100% 100%   EYES:  anicteric Cardiovascular: Cor Tachy Respiratory: clear; GI: Bowel sounds are normal, liver is not enlarged, spleen is not enlarged Musculoskeletal: no pedal edema noted Skin: negatives: back with minimal erythema from pressure sore area, no ulcer Hematologic: no cervical lad  Lab Results  Component Value Date   WBC 8.2 02/08/2020   HGB 9.9 (L) 02/08/2020   HCT 29.0 (L) 02/08/2020   MCV 99.1 02/08/2020   PLT 197 02/08/2020    Lab Results  Component Value Date   CREATININE 0.60 02/08/2020   BUN 15 02/08/2020   NA 134 (L) 02/08/2020   K 4.1 02/08/2020   CL 101 02/08/2020   CO2 22 02/08/2020    Lab Results  Component Value Date   ALT 10 02/08/2020   AST 15 02/08/2020   ALKPHOS 117 02/08/2020     Microbiology: Recent Results (from the past 240 hour(s))  SARS CORONAVIRUS 2 (TAT 6-24 HRS) Nasopharyngeal Nasopharyngeal Swab     Status: None   Collection Time: 01/31/20 12:54 AM   Specimen: Nasopharyngeal Swab  Result Value Ref Range Status   SARS Coronavirus 2 NEGATIVE NEGATIVE Final    Comment: (NOTE) SARS-CoV-2 target nucleic acids are NOT DETECTED. The SARS-CoV-2 RNA is generally detectable in upper and lower respiratory specimens during the acute phase of infection. Negative results do not preclude SARS-CoV-2 infection, do not rule out co-infections with other pathogens, and should not be used as the sole basis for treatment or other patient management decisions. Negative results must be combined with clinical observations, patient history, and epidemiological information. The expected result is Negative. Fact Sheet for Patients: SugarRoll.be Fact Sheet for Healthcare Providers: https://www.woods-mathews.com/ This test is not yet approved or cleared by the [place] FDA and  has been authorized for detection and/or diagnosis of SARS-CoV-2 by FDA under an Emergency Use Authorization (EUA). This EUA will remain  in effect  (meaning this test can be used) for the duration of the COVID-19 declaration under Section 56 4(b)(1) of the Act, 21 U.S.C. section 360bbb-3(b)(1), unless the authorization is terminated or revoked sooner. Performed at [facility], [place] [address] [place], [place] [address]   Blood Culture (routine x 2)     Status: None   Collection Time: 01/31/20  8:15 PM   Specimen: BLOOD RIGHT HAND  Result Value Ref Range Status   Specimen Description BLOOD RIGHT HAND  Final   Special Requests   Final    BOTTLES DRAWN AEROBIC AND ANAEROBIC Blood Culture adequate volume   Culture NO GROWTH 5 DAYS  Final   Report Status 02/05/2020 FINAL  Final  Urine culture     Status: Abnormal  Collection Time: 01/31/20  8:15 PM   Specimen: In/Out Cath Urine  Result Value Ref Range Status   Specimen Description IN/OUT CATH URINE  Final   Special Requests   Final    NONE Performed at [facility], [place] [address] [place], [place] [address]    Culture MULTIPLE SPECIES PRESENT, SUGGEST RECOLLECTION (A)  Final   Report Status 02/01/2020 FINAL  Final  Blood Culture (routine x 2)     Status: None   Collection Time: 02/01/20  8:58 AM   Specimen: BLOOD RIGHT ARM  Result Value Ref Range Status   Specimen Description BLOOD RIGHT ARM  Final   Special Requests   Final    AEROBIC BOTTLE ONLY Blood Culture results may not be optimal due to an inadequate volume of blood received in culture bottles   Culture   Final    NO GROWTH 5 DAYS Performed at [facility], [place] [address], [place], [place] [address]    Report Status 02/06/2020 FINAL  Final    Thayer Headings, [place] for Infectious Disease [facility] [email or web address] 02/08/2020, 10:31 AM

## 2020-02-09 DIAGNOSIS — R6520 Severe sepsis without septic shock: Secondary | ICD-10-CM

## 2020-02-09 DIAGNOSIS — I10 Essential (primary) hypertension: Secondary | ICD-10-CM

## 2020-02-09 DIAGNOSIS — J9601 Acute respiratory failure with hypoxia: Secondary | ICD-10-CM

## 2020-02-09 DIAGNOSIS — R5381 Other malaise: Secondary | ICD-10-CM

## 2020-02-09 DIAGNOSIS — F419 Anxiety disorder, unspecified: Secondary | ICD-10-CM

## 2020-02-09 DIAGNOSIS — Z7952 Long term (current) use of systemic steroids: Secondary | ICD-10-CM

## 2020-02-09 DIAGNOSIS — M1A9XX0 Chronic gout, unspecified, without tophus (tophi): Secondary | ICD-10-CM

## 2020-02-09 DIAGNOSIS — J45909 Unspecified asthma, uncomplicated: Secondary | ICD-10-CM

## 2020-02-09 DIAGNOSIS — J45902 Unspecified asthma with status asthmaticus: Secondary | ICD-10-CM

## 2020-02-09 DIAGNOSIS — H04129 Dry eye syndrome of unspecified lacrimal gland: Secondary | ICD-10-CM

## 2020-02-09 DIAGNOSIS — I5032 Chronic diastolic (congestive) heart failure: Secondary | ICD-10-CM

## 2020-02-09 DIAGNOSIS — A419 Sepsis, unspecified organism: Secondary | ICD-10-CM

## 2020-02-09 LAB — CBC
HCT: 27.7 % — ABNORMAL LOW (ref 36.0–46.0)
Hemoglobin: 8.1 g/dL — ABNORMAL LOW (ref 12.0–15.0)
MCH: 28.5 pg (ref 26.0–34.0)
MCHC: 29.2 g/dL — ABNORMAL LOW (ref 30.0–36.0)
MCV: 97.5 fL (ref 80.0–100.0)
Platelets: 145 10*3/uL — ABNORMAL LOW (ref 150–400)
RBC: 2.84 MIL/uL — ABNORMAL LOW (ref 3.87–5.11)
RDW: 21.2 % — ABNORMAL HIGH (ref 11.5–15.5)
WBC: 6.3 10*3/uL (ref 4.0–10.5)
nRBC: 0 % (ref 0.0–0.2)

## 2020-02-09 LAB — BASIC METABOLIC PANEL
Anion gap: 10 (ref 5–15)
BUN: 18 mg/dL (ref 8–23)
CO2: 22 mmol/L (ref 22–32)
Calcium: 9 mg/dL (ref 8.9–10.3)
Chloride: 104 mmol/L (ref 98–111)
Creatinine, Ser: 0.81 mg/dL (ref 0.44–1.00)
GFR calc Af Amer: >60 mL/min (ref >60)
GFR calc non Af Amer: >60 mL/min (ref >60)
Glucose, Bld: 112 mg/dL — ABNORMAL HIGH (ref 70–99)
Potassium: 4.5 mmol/L (ref 3.5–5.1)
Sodium: 136 mmol/L (ref 135–145)

## 2020-02-09 MED ORDER — ENSURE ENLIVE PO LIQD
237.0000 mL | Freq: Two times a day (BID) | ORAL | Status: DC
  Administered 2020-02-09 – 2020-02-23 (×16): 237 mL via ORAL

## 2020-02-09 MED ORDER — ADULT MULTIVITAMIN W/MINERALS CH
1.0000 | ORAL_TABLET | Freq: Every day | ORAL | Status: DC
  Administered 2020-02-09 – 2020-02-23 (×15): 1 via ORAL
  Filled 2020-02-09 (×15): qty 1

## 2020-02-09 NOTE — Progress Notes (Signed)
Occupational Therapy Evaluation Patient Details Name: April [name] MRN: [id number] DOB: [birth year] Today's Date: 02/09/2020    History of Present Illness Pt is a 64 y.o. female with PMH significant of PCKD s/p renal transplant; HLD; anal condyloma; CHF presenting with SOB/nausea.  She was previously hospitalized from 3/10-16 for sepsis of uncertain etiology in a transplant recipient - possibly from mucopurulent bronchitis.  She had acute respiratory failure with hypoxia. She was recommended to go to CIR but declined and went home with [facility].  Pt fell in January with subsequent L hip and wrist fxs. Pt started experiencing SOB at home and a fever.   Clinical Impression   Patient is kind and talkative.  She was here last week with sepsis and returned home for one day before coming back.  Has history of falls and hip/wrist fx.  She requires assistance at home with ADLs/IADLs from family and care giver. Reports she typically is able to walk with a rolling walker at home.  Patient presents today very weak and fearful of standing.  Required mod assist with bed mobility and mod/max x 2 for sit <> stand and lateral scoot transfer.  She also wears wrist brace on L hand and is lacking sensation/coordination with L digits, particularly thumb and index.  Patient would benefit from OT to increase strength and activity tolerance, as well as fine motor coordiantion for increased independence with ADLs.    Follow Up Recommendations  Supervision/Assistance - 24 hour;Home health OT    Equipment Recommendations  None recommended by OT    Recommendations for Other Services       Precautions / Restrictions Precautions Precautions: Fall Precaution Comments: L wrist brace for comfort Restrictions Weight Bearing Restrictions: No LUE Weight Bearing: Weight bearing as tolerated LLE Weight Bearing: Weight bearing as tolerated      Mobility Bed Mobility Overal bed mobility: Needs Assistance Bed Mobility:  Supine to Sit     Supine to sit: HOB elevated;Mod assist;+2 for physical assistance     General bed mobility comments: Pt requires increased time and has difficulty raising her trunk to a seated position. Mod A x 2 for trunk and hip positioning.   Transfers Overall transfer level: Needs assistance Equipment used: Rolling walker (2 wheeled) Transfers: Sit to/from Stand;Lateral/Scoot Transfers Sit to Stand: Mod assist;+2 physical assistance;Max assist        Lateral/Scoot Transfers: Mod assist;+2 physical assistance;From elevated surface General transfer comment: Max x 2 for first stand, mod x 2 on second stand.  Lacking core strength to stand up straight    Balance Overall balance assessment: Needs assistance Sitting-balance support: Single extremity supported;Feet supported Sitting balance-Leahy Scale: Poor Sitting balance - Comments: Heavy reliance on unilateral UE support to maintain balance; min guard to min A to maintaing EOB sitting balance   Standing balance support: During functional activity;Bilateral upper extremity supported Standing balance-Leahy Scale: Zero Standing balance comment: B UE support on RW; pt requires max A to stand upright and for safety, pt fatigues easily in standing. Able to maintain standing <1 minute.                            ADL either performed or assessed with clinical judgement   ADL Overall ADL's : Needs assistance/impaired Eating/Feeding: Set up;Sitting   Grooming: Set up;Sitting   Upper Body Bathing: Minimal assistance;Sitting   Lower Body Bathing: Maximal assistance;Sitting/lateral leans   Upper Body Dressing : Minimal assistance;Sitting  Lower Body Dressing: Maximal assistance;Sitting/lateral leans   Toilet Transfer: Maximal assistance;+2 for physical assistance           Functional mobility during ADLs: Maximal assistance;+2 for physical assistance       Vision         Perception     Praxis       Pertinent Vitals/Pain Pain Assessment: Faces Faces Pain Scale: Hurts a little bit Pain Location: L wrist Pain Descriptors / Indicators: Discomfort;Sore Pain Intervention(s): Limited activity within patient's tolerance;Monitored during session     Hand Dominance Right   Extremity/Trunk Assessment Upper Extremity Assessment Upper Extremity Assessment: Generalized weakness;LUE deficits/detail LUE Deficits / Details: recent wrist fx, weakness and guarded, impaired sensation reporting numbness in thumb and index   Lower Extremity Assessment Lower Extremity Assessment: Generalized weakness       Communication Communication Communication: No difficulties   Cognition Arousal/Alertness: Awake/alert Behavior During Therapy: WFL for tasks assessed/performed Overall Cognitive Status: Within Functional Limits for tasks assessed                                     General Comments  BP resting supine is 125/65, EOB 135/67, and after transfer into chair 111/57    Exercises Exercises: Other exercises Other Exercises Other Exercises: pursed lip breathing Other Exercises: x2 sit <> stand Other Exercises: Upright sitting in chair without UE support education and practice- pt educated to sit upright in chair without trunk/UE support every 30 minutes as long as she is up in the chair.   Shoulder Instructions      Home Living Family/patient expects to be discharged to:: Private residence Living Arrangements: Spouse/significant other;Parent;Children Available Help at Discharge: Family;Available 24 hours/day Type of Home: House Home Access: Ramped entrance     Home Layout: Two level;Able to live on main level with bedroom/bathroom Alternate Level Stairs-Number of Steps: 12   Bathroom Shower/Tub: Occupational psychologist: Standard     Home Equipment: Environmental consultant - 2 wheels;Wheelchair - [employer];Shower seat;Adaptive equipment;Hospital bed Adaptive  Equipment: Reacher;Sock aid Additional Comments: Pt has a PCA 3-4 days a week. Pts daughter is an OT. She helps with toileting, medications, bathing, and dressing. Pt reports hx of falling at home and is currently fearful to stand.      Prior Functioning/Environment Level of Independence: Needs assistance  Gait / Transfers Assistance Needed: Primarily uses walker prior to recent hospital admission ADL's / Homemaking Assistance Needed: Has care giver who helps with ADLs and IADLS - does baths, helps with toileting, manages medications   Comments: independent with RW prior to fall, since CIR stay has been mostly in bed, working with HHPT on standing and strengthening; bed level ADLS with assist         OT Problem List: Decreased strength;Decreased activity tolerance;Impaired balance (sitting and/or standing);Decreased coordination;Decreased knowledge of use of DME or AE;Cardiopulmonary status limiting activity;Pain      OT Treatment/Interventions: Self-care/ADL training;Energy conservation;Therapeutic exercise;Therapeutic activities;Patient/family education;Balance training    OT Goals(Current goals can be found in the care plan section) Acute Rehab OT Goals Patient Stated Goal: to get stronger and go home OT Goal Formulation: With patient Time For Goal Achievement: 02/19/20 Potential to Achieve Goals: Good  OT Frequency: Min 2X/week   Barriers to D/C:            Co-evaluation PT/OT/SLP Co-Evaluation/Treatment: Yes Reason for Co-Treatment: Complexity of the patient's impairments (  multi-system involvement);To address functional/ADL transfers;For patient/therapist safety   OT goals addressed during session: ADL's and self-care;Strengthening/ROM      AM-PAC OT "6 Clicks" Daily Activity     Outcome Measure Help from another person eating meals?: A Little Help from another person taking care of personal grooming?: A Little Help from another person toileting, which includes using  toliet, bedpan, or urinal?: A Lot Help from another person bathing (including washing, rinsing, drying)?: A Lot Help from another person to put on and taking off regular upper body clothing?: A Little Help from another person to put on and taking off regular lower body clothing?: A Lot 6 Click Score: 15   End of Session Equipment Utilized During Treatment: Gait belt;Rolling walker;Oxygen Nurse Communication: Mobility status  Activity Tolerance: Patient tolerated treatment well Patient left: in chair;with call bell/phone within reach;Other (comment)(No chair alarm pads available on floor - RN aware)  OT Visit Diagnosis: Other abnormalities of gait and mobility (R26.89);Muscle weakness (generalized) (M62.81);Pain;History of falling (Z91.81) Pain - Right/Left: Left Pain - part of body: Hand                Time: 8721-5872 OT Time Calculation (min): 57 min Charges:  OT General Charges $OT Visit: 1 Visit OT Evaluation $OT Eval Moderate Complexity: 1 Mod OT Treatments $Self Care/Home Management : 8-22 mins  August Luz, OTR/L   Phylliss Bob 02/09/2020, 2:33 PM

## 2020-02-09 NOTE — Progress Notes (Addendum)
Initial Nutrition Assessment  DOCUMENTATION CODES:   Obesity unspecified  INTERVENTION:   -Ensure Enlive po BID, each supplement provides 350 kcal and 20 grams of protein -MVI with minerals daily  NUTRITION DIAGNOSIS:   Inadequate oral intake related to decreased appetite as evidenced by per patient/family report.  GOAL:   Patient will meet greater than or equal to 90% of their needs  MONITOR:   PO intake, Supplement acceptance, Labs, Weight trends, Skin, I & O's  REASON FOR ASSESSMENT:   Malnutrition Screening Tool    ASSESSMENT:   April [name] is a 64 y.o. female with medical history significant of PCKD s/p renal transplant; HLD; anal condyloma; and CHF presenting with SOB/nausea.  She was previously hospitalized from 3/10-16 for sepsis of uncertain etiology in a transplant recipient - possibly from mucopurulent bronchitis.  She had acute respiratory failure with hypoxia.  ID was involved and she was treated with broad spectrum antibiotics and transitioned to IV ceftriaxone.  She was recommended to go to CIR but declined and went home with home health services.  She felt good when she went home - except that she was having horrible burning diarrhea "from the antibiotics."  That got worse and worse and she couldn't tolerate it anymore.  She didn't have as much help at home to help her clean up.  She has been coughing and dry heaving.  Her last BM was yesterday.  Overnight, she started having a hard time breathing and couldn't quit dry heaving.  She had recurrent fever at home up to 100.4.  She is no longer still having cough.  No urinary symptoms.  Pt admitted with sepsis.   Reviewed I/O's: +1.1 L x 24 hours  Spoke with pt, who was sitting in recliner chair at time of visit. Pt shares that she feels better today. PTA, pt reports poor appetite for approximately one week; she explains that she was having very poor intake secondary to diarrhea as a side effect of antibiotics  she was taking. Additionally, pt reports over the past 1-2 days she has eaten "literally nothing" due to feeling bloated and constipated. However, since having a bowel movement today, her intake has improved- pt shares she consumed about 75% of her breakfast this morning.   Pt shares her UBW is around 175# and estimates she has lost about 10# within the past month. Reviewed wt hx; noted pt has experienced a 4.9% wt loss over the past 3 months, which is not significant for time frame.   Pt expresses concern over muscle loss secondary to limited mobility. Discussed importance of good meal and supplement intake to promote healing. Pt amenable to supplements until intake consistently improves.  Medications reviewed and include lactated ringers infusion @ 100 ml/hr.   Labs reviewed.   NUTRITION - FOCUSED PHYSICAL EXAM:    Most Recent Value  Orbital Region  No depletion  Upper Arm Region  No depletion  Thoracic and Lumbar Region  No depletion  Buccal Region  No depletion  Temple Region  No depletion  Clavicle Bone Region  No depletion  Clavicle and Acromion Bone Region  No depletion  Scapular Bone Region  No depletion  Dorsal Hand  No depletion  Patellar Region  No depletion  Anterior Thigh Region  No depletion  Posterior Calf Region  No depletion  Edema (RD Assessment)  Mild  Hair  Reviewed  Eyes  Reviewed  Mouth  Reviewed  Skin  Reviewed  Nails  Reviewed  Diet Order:   Diet Order            Diet regular Room service appropriate? Yes; Fluid consistency: Thin  Diet effective now              EDUCATION NEEDS:   Education needs have been addressed  Skin:  Skin Assessment: Reviewed RN Assessment  Last BM:  02/06/20  Height:   Ht Readings from Last 1 Encounters:  02/08/20 5\' 2"  (1.575 m)    Weight:   Wt Readings from Last 1 Encounters:  02/08/20 75.5 kg    Ideal Body Weight:  50 kg  BMI:  Body mass index is 30.44 kg/m.  Estimated Nutritional Needs:    Kcal:  1900-2100  Protein:  100-115 grams  Fluid:  > 1.9 L    Loistine Chance, RD, LDN, [place] Registered Dietitian II Certified Diabetes Care and Education Specialist Please refer to [facility] for RD and/or RD on-call/weekend/after hours pager

## 2020-02-09 NOTE — Evaluation (Signed)
Physical Therapy Evaluation Patient Details Name: April [name] MRN: [id number] DOB: 16-Dec-1955 Today's Date: 02/09/2020   History of Present Illness  Pt is a 64 y.o. female with PMH significant of PCKD s/p renal transplant; HLD; anal condyloma; CHF presenting with SOB/nausea.  She was previously hospitalized from 3/10-16 for sepsis of uncertain etiology in a transplant recipient - possibly from mucopurulent bronchitis.  She had acute respiratory failure with hypoxia. She was recommended to go to CIR but declined and went home with [facility].  Pt fell in January with subsequent L hip and wrist fxs. Pt started experiencing SOB at home and a fever.    Clinical Impression  Pt is pleasant and agreeable to try to get OOB. Pt is on 1L of supp O2, her BP was running low earlier however remained WNL throughout session with only slight drop after functional mobility (see comments below for readings). Pt lives at home with family and has recently had a PCA come 3-4 days a week to help pt with ADLs and medication management. Pt was previously independent and ambulatory with RW prior to frequent hospitalizations however has a hx of falling with most recent fall in January in which she fractured her L hip and wrist. Pt has subsequent wrist pain (wears a brace), numbness in that hand, and difficulty gripping. Pt is very fearful of falling and standing. Pt requires mod A x 2 supine>sit, and mod to max A x2 sit<>stand at RW with poor hip extension and core strength required to fully stand. Pt has poor standing tolerance and is used to laterally scooting bed<>chair in which she required mod A x2. Pt presents with decreased functional mobility, endurance, strength, and balance. However, due to pt being immunocompromised pt will not accept going to a SNF and wasn't able to tolerate CIF after previous hospitalization. HH with 24 hour supervision is recommended at this time. PT will continue to follow pt acutely.     Follow Up  Recommendations Home health PT;Supervision/Assistance - 24 hour    Equipment Recommendations  None recommended by PT       Precautions / Restrictions Precautions Precautions: Fall Precaution Comments: L wrist brace for comfort Restrictions Weight Bearing Restrictions: No LUE Weight Bearing: Weight bearing as tolerated LLE Weight Bearing: Weight bearing as tolerated      Mobility  Bed Mobility Overal bed mobility: Needs Assistance Bed Mobility: Supine to Sit     Supine to sit: HOB elevated;Mod assist;+2 for physical assistance     General bed mobility comments: Pt requires increased time and has difficulty raising her trunk to a seated position. Mod A x 2 for trunk and hip positioning.   Transfers Overall transfer level: Needs assistance Equipment used: Rolling walker (2 wheeled) Transfers: Sit to/from Stand;Lateral/Scoot Transfers Sit to Stand: Mod assist;+2 physical assistance;Max assist        Lateral/Scoot Transfers: Mod assist;+2 physical assistance;From elevated surface General transfer comment: First attempt to stand, pt required mod A x 2 to power up, has poor core control and ability to extend her hips to an upright position. 2nd stand pt required mod A x 2 to stand upright. Pt is fearful to stand due to hx of falls.   Ambulation/Gait             General Gait Details: unable at this time                 Balance Overall balance assessment: Needs assistance Sitting-balance support: Single extremity supported;Feet supported Sitting  balance-Leahy Scale: Poor Sitting balance - Comments: Heavy reliance on unilateral UE support to maintain balance; min guard to min A to maintaing EOB sitting balance   Standing balance support: During functional activity;Bilateral upper extremity supported Standing balance-Leahy Scale: Zero Standing balance comment: B UE support on RW; pt requires max A to stand upright and for safety, pt fatigues easily in standing.  Able to maintain standing <1 minute.                              Pertinent Vitals/Pain Pain Assessment: Faces Faces Pain Scale: Hurts a little bit Pain Location: L wrist Pain Descriptors / Indicators: Discomfort;Sore Pain Intervention(s): Limited activity within patient's tolerance;Monitored during session    [place] expects to be discharged to:: Private residence Living Arrangements: Spouse/significant other;Parent;Children Available Help at Discharge: Family;Available 24 hours/day Type of Home: House Home Access: Ramped entrance     Home Layout: Two level;Able to live on main level with bedroom/bathroom Home Equipment: Gilford Rile - 2 wheels;Wheelchair - [employer];Shower seat;Adaptive equipment;Hospital bed Additional Comments: Pt has a PCA 3-4 days a week. She helps with toileting, medications, bathing, and dressing. Pt reports hx of falling at home and is currently fearful to stand.    Prior Function Level of Independence: Independent with assistive device(s)         Comments: independent with RW prior to fall, since CIR stay has been mostly in bed, working with HHPT on standing and strengthening; bed level ADLS with assist      Hand Dominance   Dominant Hand: Right    Extremity/Trunk Assessment   Upper Extremity Assessment Upper Extremity Assessment: Defer to OT evaluation    Lower Extremity Assessment Lower Extremity Assessment: Generalized weakness       Communication   Communication: No difficulties  Cognition Arousal/Alertness: Awake/alert Behavior During Therapy: WFL for tasks assessed/performed Overall Cognitive Status: Within Functional Limits for tasks assessed                                        General Comments General comments (skin integrity, edema, etc.): Pt is on 1L of O2. SpO2 remains between 96-99% throughout tx. BP resting supine is 125/65, EOB 135/67, and after transfer into chair  111/57    Exercises Other Exercises Other Exercises: pursed lip breathing Other Exercises: Standing tolerance at RW. STS x 2 with pt ability to stand at [facility] <1 minute intervals. Other Exercises: Upright sitting in chair without UE support education and practice- pt educated to sit upright in chair without trunk/UE support every 30 minutes as long as she is up in the chair.      PT Assessment Patient needs continued PT services  PT Problem List Decreased strength;Decreased activity tolerance;Decreased balance;Decreased mobility;Decreased knowledge of use of DME;Pain;Decreased coordination;Cardiopulmonary status limiting activity       PT Treatment Interventions DME instruction;Gait training;Functional mobility training;Therapeutic activities;Therapeutic exercise;Balance training;Neuromuscular re-education;Patient/family education;Wheelchair mobility training    PT Goals (Current goals can be found in the Care Plan section)  Acute Rehab PT Goals Patient Stated Goal: to get stronger and go home PT Goal Formulation: With patient Time For Goal Achievement: 02/23/20 Potential to Achieve Goals: Fair    Frequency Min 3X/week        Co-evaluation PT/OT/SLP Co-Evaluation/Treatment: Yes Reason for Co-Treatment: Complexity of the patient's impairments (multi-system involvement);For patient/therapist  safety;To address functional/ADL transfers           AM-PAC PT "6 Clicks" Mobility  Outcome Measure Help needed turning from your back to your side while in a flat bed without using bedrails?: A Little Help needed moving from lying on your back to sitting on the side of a flat bed without using bedrails?: A Lot Help needed moving to and from a bed to a chair (including a wheelchair)?: A Lot Help needed standing up from a chair using your arms (e.g., wheelchair or bedside chair)?: Total Help needed to walk in hospital room?: Total Help needed climbing 3-5 steps with a railing? : Total 6  Click Score: 10    End of Session Equipment Utilized During Treatment: Gait belt;Oxygen Activity Tolerance: Patient limited by fatigue;Patient tolerated treatment well Patient left: with call bell/phone within reach;in chair Nurse Communication: Mobility status;Other (comment)(Use Stedy for back to bed) PT Visit Diagnosis: Muscle weakness (generalized) (M62.81);History of falling (Z91.81);Difficulty in walking, not elsewhere classified (R26.2)    Time: 4445-8483 PT Time Calculation (min) (ACUTE ONLY): 53 min   Charges:   PT Evaluation $PT Eval Moderate Complexity: 1 Mod PT Treatments $Therapeutic Activity: 8-22 mins        Jodelle Green, PT, DPT Acute Rehabilitation Services Office [phone]   Jodelle Green 02/09/2020, 12:32 PM

## 2020-02-09 NOTE — Progress Notes (Signed)
PHARMACY - PHYSICIAN COMMUNICATION CRITICAL VALUE ALERT - BLOOD CULTURE IDENTIFICATION (BCID)  April [name] is an 64 y.o. female with sepsis (immunocompromised patient)  Assessment:  Blood cultures show GPC in clusters 1/4 and noted with recent fever. She was on vancomycin/cefepime and flagyl. ID was consulted and antibiotics were stopped with close observation. Culture likely represent a contaminant  Name of physician (or Provider) Contacted: X. Blount NP  Current antibiotics: none  Changes to prescribed antibiotics recommended:  -No changes recommended  Hildred Laser, PharmD Clinical Pharmacist **Pharmacist phone directory can now be found on [email or web address] ([email or web address]).  Listed under [place].

## 2020-02-09 NOTE — Progress Notes (Signed)
[name]  PROGRESS NOTE    LOUISIANA SEARLES  JZP:[id number] DOB: 10/26/1956 DOA: 02/08/2020 PCP: [name], MD   Brief Narrative:   April [name] is a 64 y.o. female with medical history significant of PCKD s/p renal transplant; HLD; anal condyloma; and CHF presenting with SOB/nausea.  She was previously hospitalized from 3/10-16 for sepsis of uncertain etiology in a transplant recipient - possibly from mucopurulent bronchitis.  She had acute respiratory failure with hypoxia.  ID was involved and she was treated with broad spectrum antibiotics and transitioned to IV ceftriaxone.  She was recommended to go to CIR but declined and went home with home health services.  She felt good when she went home - except that she was having horrible burning diarrhea "from the antibiotics."  That got worse and worse and she couldn't tolerate it anymore.  She didn't have as much help at home to help her clean up.  She has been coughing and dry heaving.  Her last BM was yesterday.  Overnight, she started having a hard time breathing and couldn't quit dry heaving.  She had recurrent fever at home up to 100.4.  She is no longer still having cough.  No urinary symptoms.    She fell in January - hip and pelvic fractures as well as wrist fracture.  She went to CIR but was in too much pain to be successful.  She is now bed bound.  Every time she starts to get more mobile, she has a setback or illness. They recommended return to CIR after last hospitalization.  She declined because she didn't have the strength to keep up with the program.  She refused a SNF rehab due to her immunosuppression.  She continues to feel the same about disposition.  02/09/20: No fevers ON. BP ok. White count ok. No sources as yet identified. Spoke to her about stopping abx. ID agrees. Stop abx today. Continue steroids for now.    Assessment & Plan:   Principal Problem:   Sepsis ([place]) Active Problems:   Renal transplant recipient   Chronic  diastolic CHF (congestive heart failure) (HCC)   Physical deconditioning   Essential hypertension   Glaucoma  Sepsis in an immunocompromised patient, uncertain etiology     - Patient with recent hospitalization, discharged on 3/16 after sepsis from uncertain source     - She returned with fever, tachycardia, tachypnea concerning for recurrent sepsis     - She did develop severe diarrhea with Augmentin but this appears to have abated     - continue stress dose steroids for now; abx have been stopped, she is afebrile ON     - Bld Cx NTD     - ID onboard, appreciate assistance  PCKD s/p renal transplantation     - continue immunosuppressants including mycophenolate and tacrolimus     - continue stress dose steroids; will need to tape  HTN     - continue metoprolol  Chronic diastolic CHF     - continue metoprolol     - euvolemic  Glaucoma Dry eye     - continue home drops  Debility     - Since her fall in January, she has been essentially bed bound     - She is quite deconditioned and this is unlikely to improve her long-term outcomes     - PT/OT: HHPT/OT     - Pt is encouraged to get OOB to chair at least q shift     -  will order palliative care consultation  Asthma     - continue dulera, mucinex  Gout     - allopurinol  Chronic spasm/pain     - robaxin, gabapentic  Anxiety/depression     - prozac  GERD     - protonix  DVT prophylaxis: lovenox Code Status: FULL   Disposition Plan: If she remains stable (afebrile, normal WBC); consider for d/c in AM.  Consultants:   ID   ROS:  Reports fatigue . Remainder 10-pt ROS is negative for all not previously mentioned.  Subjective: "C diff is no joke."  Objective: Vitals:   02/08/20 1449 02/08/20 1556 02/08/20 2300 02/09/20 0805  BP:  (!) 132/59 (!) 120/57 133/67  Pulse:  95 85 78  Resp:  20 18 16   Temp: 99.6 F (37.6 C) 98.6 F (37 C) 97.8 F (36.6 C) 98.5 F (36.9 C)  TempSrc: Oral Oral Oral   SpO2:   100% 100% 100%  Weight:      Height:        Intake/Output Summary (Last 24 hours) at 02/09/2020 1647 Last data filed at 02/09/2020 1500 Gross per 24 hour  Intake 2100 ml  Output 850 ml  Net 1250 ml   Filed Weights   02/08/20 0728  Weight: 75.5 kg    Examination:  General: 64 y.o. female resting in chair in NAD Cardiovascular: RRR, +S1, S2, no m/g/r Respiratory: CTABL, no w/r/r, normal WOB GI: BS+, NDNT, soft, obese MSK: No e/c/c Neuro: Alert to name, follows commands  Data Reviewed: I have personally reviewed following labs and imaging studies.  CBC: Recent Labs  Lab 02/03/20 0205 02/03/20 0205 02/04/20 0347 02/04/20 0347 02/05/20 0318 02/06/20 1026 02/08/20 0435 02/08/20 0541 02/09/20 0307  WBC 7.9   < > 5.4  --  5.7 9.6 8.2  --  6.3  NEUTROABS 6.8  --  4.3  --  4.5 8.5* 6.6  --   --   HGB 7.4*   < > 7.4*   < > 7.7* 8.2* 8.9* 9.9* 8.1*  HCT 25.6*   < > 26.6*   < > 27.3* 28.8* 31.6* 29.0* 27.7*  MCV 98.5   < > 98.9  --  96.8 98.3 99.1  --  97.5  PLT 203   < > 210  --  190 211 197  --  145*   < > = values in this interval not displayed.   Basic Metabolic Panel: Recent Labs  Lab 02/03/20 0205 02/03/20 0205 02/04/20 0347 02/04/20 0347 02/05/20 0318 02/06/20 1026 02/08/20 0435 02/08/20 0541 02/09/20 0307  NA 140   < > 141   < > 140 140 135 134* 136  K 4.6   < > 4.1   < > 3.8 4.0 4.0 4.1 4.5  CL 110   < > 107   < > 107 107 101 101 104  CO2 20*   < > 25  --  25 22 22   --  22  GLUCOSE 109*   < > 112*   < > 115* 185* 102* 91 112*  BUN 26*   < > 25*   < > 24* 20 14 15 18   CREATININE 0.93   < > 0.86   < > 0.93 0.88 0.72 0.60 0.81  CALCIUM 9.4   < > 9.4  --  9.3 9.3 9.4  --  9.0  MG 2.1  --  1.8  --  1.9 1.4*  --   --   --  PHOS 3.1  --  2.4*  --  2.5 1.9*  --   --   --    < > = values in this interval not displayed.   GFR: Estimated Creatinine Clearance: 68.6 mL/min (by C-G formula based on SCr of 0.81 mg/dL). Liver Function Tests: Recent Labs  Lab  02/03/20 0205 02/04/20 0347 02/05/20 0318 02/06/20 1026 02/08/20 0435  AST 17 12* 13* 16 15  ALT 7 8 9 9 10   ALKPHOS 105 97 114 118 117  BILITOT 1.0 0.9 0.9 1.1 1.6*  PROT 6.1* 6.1* 6.3* 6.7 6.8  ALBUMIN 2.2* 2.2* 2.5* 2.6* 2.7*   No results for input(s): LIPASE, AMYLASE in the last 168 hours. No results for input(s): AMMONIA in the last 168 hours. Coagulation Profile: Recent Labs  Lab 02/08/20 0435  INR 1.2   Cardiac Enzymes: No results for input(s): CKTOTAL, CKMB, CKMBINDEX, TROPONINI in the last 168 hours. BNP (last 3 results) No results for input(s): PROBNP in the last 8760 hours. HbA1C: No results for input(s): HGBA1C in the last 72 hours. CBG: No results for input(s): GLUCAP in the last 168 hours. Lipid Profile: No results for input(s): CHOL, HDL, LDLCALC, TRIG, CHOLHDL, LDLDIRECT in the last 72 hours. Thyroid Function Tests: No results for input(s): TSH, T4TOTAL, FREET4, T3FREE, THYROIDAB in the last 72 hours. Anemia Panel: No results for input(s): VITAMINB12, FOLATE, FERRITIN, TIBC, IRON, RETICCTPCT in the last 72 hours. Sepsis Labs: Recent Labs  Lab 02/08/20 0502 02/08/20 0913  PROCALCITON  --  0.34  LATICACIDVEN 0.9  --     Recent Results (from the past 240 hour(s))  SARS CORONAVIRUS 2 (TAT 6-24 HRS) Nasopharyngeal Nasopharyngeal Swab     Status: None   Collection Time: 01/31/20 12:54 AM   Specimen: Nasopharyngeal Swab  Result Value Ref Range Status   SARS Coronavirus 2 NEGATIVE NEGATIVE Final    Comment: (NOTE) SARS-CoV-2 target nucleic acids are NOT DETECTED. The SARS-CoV-2 RNA is generally detectable in upper and lower respiratory specimens during the acute phase of infection. Negative results do not preclude SARS-CoV-2 infection, do not rule out co-infections with other pathogens, and should not be used as the sole basis for treatment or other patient management decisions. Negative results must be combined with clinical observations, patient  history, and epidemiological information. The expected result is Negative. Fact Sheet for Patients: SugarRoll.be Fact Sheet for Healthcare Providers: https://www.woods-mathews.com/ This test is not yet approved or cleared by the [place] FDA and  has been authorized for detection and/or diagnosis of SARS-CoV-2 by FDA under an Emergency Use Authorization (EUA). This EUA will remain  in effect (meaning this test can be used) for the duration of the COVID-19 declaration under Section 56 4(b)(1) of the Act, 21 U.S.C. section 360bbb-3(b)(1), unless the authorization is terminated or revoked sooner. Performed at [facility], [place] [address], [place], [place] [address]   Blood Culture (routine x 2)     Status: None   Collection Time: 01/31/20  8:15 PM   Specimen: BLOOD RIGHT HAND  Result Value Ref Range Status   Specimen Description BLOOD RIGHT HAND  Final   Special Requests   Final    BOTTLES DRAWN AEROBIC AND ANAEROBIC Blood Culture adequate volume   Culture NO GROWTH 5 DAYS  Final   Report Status 02/05/2020 FINAL  Final  Urine culture     Status: Abnormal   Collection Time: 01/31/20  8:15 PM   Specimen: In/Out Cath Urine  Result Value Ref  Range Status   Specimen Description IN/OUT CATH URINE  Final   Special Requests   Final    NONE Performed at [facility], [place] [address], [place], [place] [address]    Culture MULTIPLE SPECIES PRESENT, SUGGEST RECOLLECTION (A)  Final   Report Status 02/01/2020 FINAL  Final  Blood Culture (routine x 2)     Status: None   Collection Time: 02/01/20  8:58 AM   Specimen: BLOOD RIGHT ARM  Result Value Ref Range Status   Specimen Description BLOOD RIGHT ARM  Final   Special Requests   Final    AEROBIC BOTTLE ONLY Blood Culture results may not be optimal due to an inadequate volume of blood received in culture bottles   Culture   Final    NO GROWTH 5 DAYS Performed at [facility],  [place] [address] [place], [place] [address]    Report Status 02/06/2020 FINAL  Final  Blood Culture (routine x 2)     Status: None (Preliminary result)   Collection Time: 02/08/20  4:25 AM   Specimen: BLOOD RIGHT HAND  Result Value Ref Range Status   Specimen Description BLOOD RIGHT HAND  Final   Special Requests   Final    BOTTLES DRAWN AEROBIC AND ANAEROBIC Blood Culture adequate volume   Culture   Final    NO GROWTH 1 DAY Performed at [facility], [place] [address] [place], [name] [address]    Report Status PENDING  Incomplete  Blood Culture (routine x 2)     Status: None (Preliminary result)   Collection Time: 02/08/20  4:40 AM   Specimen: BLOOD RIGHT ARM  Result Value Ref Range Status   Specimen Description BLOOD RIGHT ARM  Final   Special Requests   Final    BOTTLES DRAWN AEROBIC AND ANAEROBIC Blood Culture adequate volume   Culture   Final    NO GROWTH 1 DAY Performed at [facility], [place] [address], [place], [place] [address]    Report Status PENDING  Incomplete  SARS CORONAVIRUS 2 (TAT 6-24 HRS) Nasopharyngeal Nasopharyngeal Swab     Status: None   Collection Time: 02/08/20  6:50 AM   Specimen: Nasopharyngeal Swab  Result Value Ref Range Status   SARS Coronavirus 2 NEGATIVE NEGATIVE Final    Comment: (NOTE) SARS-CoV-2 target nucleic acids are NOT DETECTED. The SARS-CoV-2 RNA is generally detectable in upper and lower respiratory specimens during the acute phase of infection. Negative results do not preclude SARS-CoV-2 infection, do not rule out co-infections with other pathogens, and should not be used as the sole basis for treatment or other patient management decisions. Negative results must be combined with clinical observations, patient history, and epidemiological information. The expected result is Negative. Fact Sheet for Patients: SugarRoll.be Fact Sheet for Healthcare  Providers: https://www.woods-mathews.com/ This test is not yet approved or cleared by the [place] FDA and  has been authorized for detection and/or diagnosis of SARS-CoV-2 by FDA under an Emergency Use Authorization (EUA). This EUA will remain  in effect (meaning this test can be used) for the duration of the COVID-19 declaration under Section 56 4(b)(1) of the Act, 21 U.S.C. section 360bbb-3(b)(1), unless the authorization is terminated or revoked sooner. Performed at [facility], [place] [address] [place], [place] [address]       Radiology Studies: DG Chest Port 1 View  Result Date: 02/08/2020 CLINICAL DATA:  Fever EXAM: PORTABLE CHEST 1 VIEW COMPARISON:  Three days ago FINDINGS: Minimal linear opacity that is stable and compatible with scarring. Stable low lung volumes. Normal heart size. There is no edema, consolidation, effusion, or pneumothorax. IMPRESSION: Stable from 3 days ago.  No focal pneumonia. Electronically Signed   By: Monte Fantasia M.D.   On: 02/08/2020 04:52     Scheduled Meds: . allopurinol  300 mg Oral Daily  . aspirin  81 mg Oral Daily  . brimonidine  1 drop Left Eye BID  . cycloSPORINE  1 drop Both Eyes BID  . dorzolamide  1 drop Both Eyes Daily  . enoxaparin (LOVENOX) injection  40 mg Subcutaneous Q24H  . feeding supplement (ENSURE ENLIVE)  237 mL Oral BID BM  . FLUoxetine  40 mg Oral Daily  . gabapentin  200 mg Oral TID  . guaiFENesin  600 mg Oral BID  . hydrocortisone sod succinate (SOLU-CORTEF) inj  50 mg Intravenous Q6H  . latanoprost  1 drop Both Eyes QHS  . methocarbamol  500 mg Oral QID  . metoprolol tartrate  25 mg Oral BID  . mometasone-formoterol  2 puff Inhalation BID  . multivitamin with minerals  1 tablet Oral Daily  . mycophenolate  180 mg Oral BID  . pantoprazole  40 mg Oral BID  . saccharomyces boulardii  250 mg Oral BID  . sodium chloride flush  3 mL Intravenous Q12H  . tacrolimus  0.5 mg Oral QHS  .  tacrolimus  1 mg Oral Daily   Continuous Infusions: . lactated ringers 100 mL/hr at 02/09/20 0258     LOS: 1 day    Time spent: 25 minutes spent in the coordination of care today.    Jonnie Finner, DO [facility]  If 7PM-7AM, please contact night-coverage [email or web address] 02/09/2020, 4:47 PM

## 2020-02-09 NOTE — Progress Notes (Signed)
[facility] for Infectious Disease   Reason for visit: Follow up on fever  Interval History: she has remained afebrile since admission.  She has no new symptoms and feels she is at her baseline.  No worsening sob.  She remains un ambulatory.  WBC remains wnl.     Physical Exam: Constitutional:  Vitals:   02/08/20 2300 02/09/20 0805  BP: (!) 120/57 133/67  Pulse: 85 78  Resp: 18 16  Temp: 97.8 F (36.6 C) 98.5 F (36.9 C)  SpO2: 100% 100%   patient appears in NAD Respiratory: Normal respiratory effort; CTA B Cardiovascular: RRR GI: soft, nt, nd  Review of Systems: Constitutional: negative for fevers and chills Gastrointestinal: negative for nausea and diarrhea Integument/breast: negative for rash Musculoskeletal: negative for myalgias and arthralgias  Lab Results  Component Value Date   WBC 6.3 02/09/2020   HGB 8.1 (L) 02/09/2020   HCT 27.7 (L) 02/09/2020   MCV 97.5 02/09/2020   PLT 145 (L) 02/09/2020    Lab Results  Component Value Date   CREATININE 0.81 02/09/2020   BUN 18 02/09/2020   NA 136 02/09/2020   K 4.5 02/09/2020   CL 104 02/09/2020   CO2 22 02/09/2020    Lab Results  Component Value Date   ALT 10 02/08/2020   AST 15 02/08/2020   ALKPHOS 117 02/08/2020     Microbiology: Recent Results (from the past 240 hour(s))  SARS CORONAVIRUS 2 (TAT 6-24 HRS) Nasopharyngeal Nasopharyngeal Swab     Status: None   Collection Time: 01/31/20 12:54 AM   Specimen: Nasopharyngeal Swab  Result Value Ref Range Status   SARS Coronavirus 2 NEGATIVE NEGATIVE Final    Comment: (NOTE) SARS-CoV-2 target nucleic acids are NOT DETECTED. The SARS-CoV-2 RNA is generally detectable in upper and lower respiratory specimens during the acute phase of infection. Negative results do not preclude SARS-CoV-2 infection, do not rule out co-infections with other pathogens, and should not be used as the sole basis for treatment or other patient management decisions. Negative  results must be combined with clinical observations, patient history, and epidemiological information. The expected result is Negative. Fact Sheet for Patients: SugarRoll.be Fact Sheet for Healthcare Providers: https://www.woods-mathews.com/ This test is not yet approved or cleared by the [place] FDA and  has been authorized for detection and/or diagnosis of SARS-CoV-2 by FDA under an Emergency Use Authorization (EUA). This EUA will remain  in effect (meaning this test can be used) for the duration of the COVID-19 declaration under Section 56 4(b)(1) of the Act, 21 U.S.C. section 360bbb-3(b)(1), unless the authorization is terminated or revoked sooner. Performed at [facility], [place] [address], [place], [place] [address]   Blood Culture (routine x 2)     Status: None   Collection Time: 01/31/20  8:15 PM   Specimen: BLOOD RIGHT HAND  Result Value Ref Range Status   Specimen Description BLOOD RIGHT HAND  Final   Special Requests   Final    BOTTLES DRAWN AEROBIC AND ANAEROBIC Blood Culture adequate volume   Culture NO GROWTH 5 DAYS  Final   Report Status 02/05/2020 FINAL  Final  Urine culture     Status: Abnormal   Collection Time: 01/31/20  8:15 PM   Specimen: In/Out Cath Urine  Result Value Ref Range Status   Specimen Description IN/OUT CATH URINE  Final   Special Requests   Final    NONE Performed at [facility], [place]  [address] [place], [place] [address]    Culture MULTIPLE SPECIES PRESENT, SUGGEST RECOLLECTION (A)  Final   Report Status 02/01/2020 FINAL  Final  Blood Culture (routine x 2)     Status: None   Collection Time: 02/01/20  8:58 AM   Specimen: BLOOD RIGHT ARM  Result Value Ref Range Status   Specimen Description BLOOD RIGHT ARM  Final   Special Requests   Final    AEROBIC BOTTLE ONLY Blood Culture results may not be optimal due to an inadequate volume of blood received in culture bottles   Culture    Final    NO GROWTH 5 DAYS Performed at [facility], [place] [address], [place], [place] [address]    Report Status 02/06/2020 FINAL  Final  Blood Culture (routine x 2)     Status: None (Preliminary result)   Collection Time: 02/08/20  4:25 AM   Specimen: BLOOD RIGHT HAND  Result Value Ref Range Status   Specimen Description BLOOD RIGHT HAND  Final   Special Requests   Final    BOTTLES DRAWN AEROBIC AND ANAEROBIC Blood Culture adequate volume   Culture   Final    NO GROWTH 1 DAY Performed at [facility], [place] [address] [place], [place] [address]    Report Status PENDING  Incomplete  Blood Culture (routine x 2)     Status: None (Preliminary result)   Collection Time: 02/08/20  4:40 AM   Specimen: BLOOD RIGHT ARM  Result Value Ref Range Status   Specimen Description BLOOD RIGHT ARM  Final   Special Requests   Final    BOTTLES DRAWN AEROBIC AND ANAEROBIC Blood Culture adequate volume   Culture   Final    NO GROWTH 1 DAY Performed at [facility], [place] [address] [place], [place] [address]    Report Status PENDING  Incomplete  SARS CORONAVIRUS 2 (TAT 6-24 HRS) Nasopharyngeal Nasopharyngeal Swab     Status: None   Collection Time: 02/08/20  6:50 AM   Specimen: Nasopharyngeal Swab  Result Value Ref Range Status   SARS Coronavirus 2 NEGATIVE NEGATIVE Final    Comment: (NOTE) SARS-CoV-2 target nucleic acids are NOT DETECTED. The SARS-CoV-2 RNA is generally detectable in upper and lower respiratory specimens during the acute phase of infection. Negative results do not preclude SARS-CoV-2 infection, do not rule out co-infections with other pathogens, and should not be used as the sole basis for treatment or other patient management decisions. Negative results must be combined with clinical observations, patient history, and epidemiological information. The expected result is Negative. Fact Sheet for Patients: SugarRoll.be Fact Sheet  for Healthcare Providers: https://www.woods-mathews.com/ This test is not yet approved or cleared by the [place] FDA and  has been authorized for detection and/or diagnosis of SARS-CoV-2 by FDA under an Emergency Use Authorization (EUA). This EUA will remain  in effect (meaning this test can be used) for the duration of the COVID-19 declaration under Section 56 4(b)(1) of the Act, 21 U.S.C. section 360bbb-3(b)(1), unless the authorization is terminated or revoked sooner. Performed at [facility], [place] [address], [place], [place] [address]     Impression/Plan:  1. Fever - she has remained afebrile since her initial fever.  She has no new or concerning symptoms suggesting infection and feels she is at her baseline.  I do not see any indication for continuing antibiotics with no bacterial infection concerns.  I suspect the fever is related more to  her immunosuppression, either the adrenal insufficiency on chronic prednisone and responded to the hydrocortisone she is getting or with her other medications.   I have stopped the cefepime and can watch her over the next 24 hours.   She may need an increase in prednisone and taper back down to her home regimen.  2.  Asthma/reactive airway - this is a chronic problem and evaluation has been unrevealing.  She can follow up with pulmonary.

## 2020-02-09 NOTE — TOC Initial Note (Signed)
Transition of Care [facility]) - Initial/Assessment Note    Patient Details  Name: April [name] MRN: [id number] Date of Birth: 1956/05/01  [facility] CM/SW Contact:    [name], RN Phone Number: 02/09/2020, 2:05 PM  Clinical Narrative:                 CM at bedside to assess needs. Patient states that she is from home and plans to return home. Patient states that she has had a rough year but has made modifications to home to have a walk in shower. Patient states that she currently has needed DME at home consisting of BSC, wheelchair, bath chair, walker and cane. Patient denies need for any other equipment. Patient states that she is able to afford medications and has transportation to appointments. Support system consists of family and church friends. Patient is currently active with [facility] and wishes to continue services after discharge. Will continue to follow for any further needs.  Expected Discharge Plan: Home/Self Care Barriers to Discharge: Continued Medical Work up   Patient Goals and CMS Choice Patient states their goals for this hospitalization and ongoing recovery are:: To get back home      Expected Discharge Plan and Services Expected Discharge Plan: Home/Self Care In-house Referral: NA Discharge Planning Services: CM Consult   Living arrangements for the past 2 months: Single Family Home(2 story home but can maintain on the first level)                           HH Arranged: ([place] active prior to admit and will continue upon discharge) [place] Agency: [facility] (now [facility] prior to admit)        Prior Living Arrangements/Services Living arrangements for the past 2 months: Single Family Home(2 story home but can maintain on the first level)     Do you feel safe going back to the place where you live?: Yes      Need for Family Participation in Patient Care: Yes (Comment) Care giver support system in place?: Yes  (comment) Current home services: DME, Home OT, Homehealth aide, Other (comment)(HH Education officer, museum) Criminal Activity/Legal Involvement Pertinent to Current Situation/Hospitalization: No - Comment as needed  Activities of Daily Living Home Assistive Devices/Equipment: Wheelchair, Environmental consultant (specify type) ADL Screening (condition at time of admission) Patient's cognitive ability adequate to safely complete daily activities?: Yes Is the patient deaf or have difficulty hearing?: No Does the patient have difficulty seeing, even when wearing glasses/contacts?: No Does the patient have difficulty concentrating, remembering, or making decisions?: No Patient able to express need for assistance with ADLs?: Yes Does the patient have difficulty dressing or bathing?: Yes Independently performs ADLs?: No Does the patient have difficulty walking or climbing stairs?: Yes Weakness of Legs: Left Weakness of Arms/Hands: Both  Permission Sought/Granted                  Emotional Assessment Appearance:: Appears stated age Attitude/Demeanor/Rapport: Engaged Affect (typically observed): Calm Orientation: : Oriented to Self, Oriented to Place, Oriented to  Time, Oriented to Situation Alcohol / Substance Use: Not Applicable Psych Involvement: No (comment)  Admission diagnosis:  Bronchitis [J40] Hypoxia [R09.02] Sepsis ([place]) [A41.9] Fever in other diseases [R50.81] Patient Active Problem List   Diagnosis Date Noted  . Essential hypertension 02/08/2020  . Glaucoma 02/08/2020  . Sepsis ([place]) 01/31/2020  . Hypoxia   . Shortness of breath   .  Fracture of left superior pubic ramus (HCC)   . Pain   . Anxiety state   . Left displaced femoral neck fracture ([place]) 12/15/2019  . Status post total hip replacement, left   . Post-op pain   . Acute blood loss anemia   . Polycystic kidney   . Closed left hip fracture ([place]) 12/07/2019  . Left wrist fracture 12/07/2019  . Medication management 09/22/2019  .  Physical deconditioning 09/22/2019  . Hyponatremia 06/29/2019  . Acute respiratory failure with hypoxia ([place]) 04/19/2019  . Acute on chronic diastolic CHF (congestive heart failure) ([place]) 04/19/2019  . Bilateral closed proximal tibial fracture 12/21/2018  . Closed right ankle fracture 12/21/2018  . Fracture 12/21/2018  . Lower urinary tract infectious disease 10/03/2018  . Asthma, chronic, unspecified asthma severity, with acute exacerbation 10/03/2018  . SIRS (systemic inflammatory response syndrome) ([place]) 10/03/2018  . Nausea vomiting and diarrhea 10/02/2018  . Chronic diastolic CHF (congestive heart failure) ([place]) 10/01/2017  . Influenza B 10/01/2017  . Persistent asthma with status asthmaticus   . Pneumonia 07/15/2016  . Asthma, mild intermittent 07/15/2016  . GERD (gastroesophageal reflux disease) 07/15/2016  . Renal transplant recipient 07/15/2016  . Hyperlipidemia 07/15/2016  . Depression 07/15/2016  . Bronchitis, mucopurulent recurrent ([place]) 08/08/2014  . Chronic cough 07/24/2014  . End stage renal disease ([place]) 03/23/2012  . 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 [address] Phone: [phone] Fax: [phone]     Social Determinants of Health (SDOH) Interventions    Readmission Risk Interventions Readmission Risk Prevention Plan 02/09/2020 04/20/2019  Transportation Screening Complete Complete  PCP or Specialist Appt within 3-5 Days - Not Complete  Not Complete comments - not yet ready for d/c  HRI or [place] - Not Complete  HRI or Home Care Consult comments - no needs identified  Social Work Consult for [place] Planning/Counseling - Not Complete  SW consult not completed comments - no needs identified  Palliative Care Screening - Not Applicable  Medication Review  (RN Care Manager) Referral to Pharmacy Complete  PCP or Specialist appointment within 3-5 days of discharge Complete -  [place] or Home Care Consult Complete -  [place] Patient Refused -  Some recent data might be hidden

## 2020-02-10 LAB — FERRITIN: Ferritin: 236 ng/mL (ref 11–307)

## 2020-02-10 LAB — MAGNESIUM: Magnesium: 1.6 mg/dL — ABNORMAL LOW (ref 1.7–2.4)

## 2020-02-10 LAB — CBC WITH DIFFERENTIAL/PLATELET
Abs Immature Granulocytes: 0.04 10*3/uL (ref 0.00–0.07)
Basophils Absolute: 0 10*3/uL (ref 0.0–0.1)
Basophils Relative: 0 %
Eosinophils Absolute: 0 10*3/uL (ref 0.0–0.5)
Eosinophils Relative: 0 %
HCT: 24.5 % — ABNORMAL LOW (ref 36.0–46.0)
Hemoglobin: 7.2 g/dL — ABNORMAL LOW (ref 12.0–15.0)
Immature Granulocytes: 1 %
Lymphocytes Relative: 12 %
Lymphs Abs: 0.7 10*3/uL (ref 0.7–4.0)
MCH: 28 pg (ref 26.0–34.0)
MCHC: 29.4 g/dL — ABNORMAL LOW (ref 30.0–36.0)
MCV: 95.3 fL (ref 80.0–100.0)
Monocytes Absolute: 0.2 10*3/uL (ref 0.1–1.0)
Monocytes Relative: 3 %
Neutro Abs: 5 10*3/uL (ref 1.7–7.7)
Neutrophils Relative %: 84 %
Platelets: 131 10*3/uL — ABNORMAL LOW (ref 150–400)
RBC: 2.57 MIL/uL — ABNORMAL LOW (ref 3.87–5.11)
RDW: 20.9 % — ABNORMAL HIGH (ref 11.5–15.5)
WBC: 5.9 10*3/uL (ref 4.0–10.5)
nRBC: 0 % (ref 0.0–0.2)

## 2020-02-10 LAB — COMPREHENSIVE METABOLIC PANEL
ALT: 8 U/L (ref 0–44)
AST: 13 U/L — ABNORMAL LOW (ref 15–41)
Albumin: 2.1 g/dL — ABNORMAL LOW (ref 3.5–5.0)
Alkaline Phosphatase: 83 U/L (ref 38–126)
Anion gap: 10 (ref 5–15)
BUN: 26 mg/dL — ABNORMAL HIGH (ref 8–23)
CO2: 20 mmol/L — ABNORMAL LOW (ref 22–32)
Calcium: 8.9 mg/dL (ref 8.9–10.3)
Chloride: 103 mmol/L (ref 98–111)
Creatinine, Ser: 0.98 mg/dL (ref 0.44–1.00)
GFR calc Af Amer: >60 mL/min (ref >60)
GFR calc non Af Amer: >60 mL/min (ref >60)
Glucose, Bld: 143 mg/dL — ABNORMAL HIGH (ref 70–99)
Potassium: 4.2 mmol/L (ref 3.5–5.1)
Sodium: 133 mmol/L — ABNORMAL LOW (ref 135–145)
Total Bilirubin: 1.1 mg/dL (ref 0.3–1.2)
Total Protein: 5.7 g/dL — ABNORMAL LOW (ref 6.5–8.1)

## 2020-02-10 LAB — IRON AND TIBC
Iron: 42 ug/dL (ref 28–170)
Saturation Ratios: 21 % (ref 10.4–31.8)
TIBC: 197 ug/dL — ABNORMAL LOW (ref 250–450)
UIBC: 155 ug/dL

## 2020-02-10 LAB — HEMOGLOBIN AND HEMATOCRIT, BLOOD
HCT: 25.4 % — ABNORMAL LOW (ref 36.0–46.0)
Hemoglobin: 7.3 g/dL — ABNORMAL LOW (ref 12.0–15.0)

## 2020-02-10 MED ORDER — HYDROCORTISONE NA SUCCINATE PF 100 MG IJ SOLR
50.0000 mg | Freq: Two times a day (BID) | INTRAMUSCULAR | Status: DC
  Administered 2020-02-10 – 2020-02-11 (×2): 50 mg via INTRAVENOUS
  Filled 2020-02-10 (×2): qty 2

## 2020-02-10 MED ORDER — MAGNESIUM OXIDE 400 (241.3 MG) MG PO TABS
400.0000 mg | ORAL_TABLET | Freq: Two times a day (BID) | ORAL | Status: DC
  Administered 2020-02-10 (×2): 400 mg via ORAL
  Filled 2020-02-10 (×2): qty 1

## 2020-02-10 NOTE — Progress Notes (Signed)
April [name]  PROGRESS NOTE    April [name]  FAO:[id number] DOB: August 09, 1956 DOA: 02/08/2020 PCP: [name], MD   Brief Narrative:   April [name] a 64 y.o.femalewith medical history significant ofPCKD s/p renal transplant; HLD; anal condyloma; and CHF presenting with SOB/nausea. She was previously hospitalized from 3/10-16 for sepsis of uncertain etiology in a transplant recipient - possibly from mucopurulent bronchitis. She had acute respiratory failure with hypoxia. ID was involved and she was treated with broad spectrum antibiotics and transitioned to IV ceftriaxone. She was recommended to go to CIR but declined and went home with home health services. She felt good when she went home - except that she was having horrible burning diarrhea "from the antibiotics." That got worse and worse and she couldn't tolerate it anymore. She didn't have as much help at home to help her clean up. She has been coughing and dry heaving. Her last BM was yesterday. Overnight, she started having a hard time breathing and couldn't quit dry heaving. She had recurrent fever at home up to 100.4. She is no longer still having cough. No urinary symptoms.   She fell in January - hip and pelvic fractures as well as wrist fracture. She went to CIR but was in too much pain to be successful. She is now bed bound. Every time she starts to get more mobile, she has a setback or illness. They recommended return to CIR after last hospitalization. She declined because she didn't have the strength to keep up with the program. She refused a SNF rehab due to her immunosuppression. She continues to feel the same about disposition.  02/10/20: No fevers ON. BP ok. White count ok. No sources as yet identified. Abx have been stopped. There is a 1/4 Cx positive but likely contaminant. Follow. Can decrease stress steroid. Hgb is down today, but more c/w her data from 3/13 and there are no new signs of bleed. Hemoccult  ordered. Follow.      Assessment & Plan:   Principal Problem:   Sepsis ([place]) Active Problems:   Renal transplant recipient   Chronic diastolic CHF (congestive heart failure) (HCC)   Physical deconditioning   Essential hypertension   Glaucoma  Sepsis in an immunocompromised patient, uncertain etiology     - Patient with recent hospitalization, discharged on 3/16 after sepsis from uncertain source     - She returned with fever, tachycardia, tachypnea concerning for recurrent sepsis     - She did develop severe diarrhea with Augmentin but this appears to have abated     - continue stress dose steroids for now; abx have been stopped, she is afebrile ON     - Bld Cx NTD     - ID onboard, appreciate assistance     - 3/20: No fevers ON. 1/4 vials with growth but c/w contamination     - decrease steroids  PCKD s/p renal transplantation     - continue immunosuppressants including mycophenolate and tacrolimus     - continue stress dose steroids; will need to taper     - 3/20: decrease steroids  HTN     - continue metoprolol with hold parameters  Chronic diastolic CHF     - continue metoprolol     - euvolemic     - see above  Glaucoma Dry eye     - continue home drops  Debility     - Since her fall in January, she has been essentially bed bound     -  She is quite deconditioned and this is unlikely to improve her long-term outcomes     - PT/OT: HHPT/OT     - Pt is encouraged to get OOB to chair at least q shift     - palliative care consultation  Asthma     - continue dulera, mucinex  Gout     - allopurinol  Chronic spasm/pain     - robaxin, gabapentic  Anxiety/depression     - prozac  GERD     - protonix  Macrocytic anemia Thrombocytopenia     - check iron studies     - hold lovenox     - no evidence of bleed     - check hemoccult  DVT prophylaxis: SCDs Code Status: FULL   Disposition Plan: W/u ongoing. Not medically stable for  discharge.  Consultants:   ID   ROS:  Reports fatigue, dyspnea. Remainder 10-pt ROS is negative for all not previously mentioned.  Subjective: "I just feel weak."  Objective: Vitals:   02/09/20 2012 02/09/20 2235 02/10/20 0613 02/10/20 0827  BP:  (!) 98/54    Pulse: 68 67    Resp: 18 17    Temp:  98.1 F (36.7 C)    TempSrc:  Oral    SpO2: 99% 100%  99%  Weight:   80.6 kg   Height:        Intake/Output Summary (Last 24 hours) at 02/10/2020 1434 Last data filed at 02/10/2020 1300 Gross per 24 hour  Intake 1831.67 ml  Output 1600 ml  Net 231.67 ml   Filed Weights   02/08/20 0728 02/10/20 0613  Weight: 75.5 kg 80.6 kg    Examination:  General: 64 y.o. female resting in bed in NAD Cardiovascular: RRR, +S1, S2, no m/g/r, equal pulses throughout Respiratory: CTABL, no w/r/r, normal WOB GI: BS+, NDNT, no masses noted, no organomegaly noted MSK: No e/c/c Neuro: A&O x 3, no focal deficits Psyc: Appropriate interaction and affect, calm/cooperative   Data Reviewed: I have personally reviewed following labs and imaging studies.  CBC: Recent Labs  Lab 02/04/20 0347 02/04/20 0347 02/05/20 0318 02/05/20 0318 02/06/20 1026 02/06/20 1026 02/08/20 0435 02/08/20 0541 02/09/20 0307 02/10/20 0327 02/10/20 0924  WBC 5.4   < > 5.7  --  9.6  --  8.2  --  6.3 5.9  --   NEUTROABS 4.3  --  4.5  --  8.5*  --  6.6  --   --  5.0  --   HGB 7.4*   < > 7.7*   < > 8.2*   < > 8.9* 9.9* 8.1* 7.2* 7.3*  HCT 26.6*   < > 27.3*   < > 28.8*   < > 31.6* 29.0* 27.7* 24.5* 25.4*  MCV 98.9   < > 96.8  --  98.3  --  99.1  --  97.5 95.3  --   PLT 210   < > 190  --  211  --  197  --  145* 131*  --    < > = values in this interval not displayed.   Basic Metabolic Panel: Recent Labs  Lab 02/04/20 0347 02/04/20 0347 02/05/20 0318 02/05/20 0318 02/06/20 1026 02/08/20 0435 02/08/20 0541 02/09/20 0307 02/10/20 0327  NA 141   < > 140   < > 140 135 134* 136 133*  K 4.1   < > 3.8   < > 4.0  4.0 4.1 4.5 4.2  CL 107   < >  107   < > 107 101 101 104 103  CO2 25   < > 25  --  22 22  --  22 20*  GLUCOSE 112*   < > 115*   < > 185* 102* 91 112* 143*  BUN 25*   < > 24*   < > 20 14 15 18  26*  CREATININE 0.86   < > 0.93   < > 0.88 0.72 0.60 0.81 0.98  CALCIUM 9.4   < > 9.3  --  9.3 9.4  --  9.0 8.9  MG 1.8  --  1.9  --  1.4*  --   --   --  1.6*  PHOS 2.4*  --  2.5  --  1.9*  --   --   --   --    < > = values in this interval not displayed.   GFR: Estimated Creatinine Clearance: 58.5 mL/min (by C-G formula based on SCr of 0.98 mg/dL). Liver Function Tests: Recent Labs  Lab 02/04/20 0347 02/05/20 0318 02/06/20 1026 02/08/20 0435 02/10/20 0327  AST 12* 13* 16 15 13*  ALT 8 9 9 10 8   ALKPHOS 97 114 118 117 83  BILITOT 0.9 0.9 1.1 1.6* 1.1  PROT 6.1* 6.3* 6.7 6.8 5.7*  ALBUMIN 2.2* 2.5* 2.6* 2.7* 2.1*   No results for input(s): LIPASE, AMYLASE in the last 168 hours. No results for input(s): AMMONIA in the last 168 hours. Coagulation Profile: Recent Labs  Lab 02/08/20 0435  INR 1.2   Cardiac Enzymes: No results for input(s): CKTOTAL, CKMB, CKMBINDEX, TROPONINI in the last 168 hours. BNP (last 3 results) No results for input(s): PROBNP in the last 8760 hours. HbA1C: No results for input(s): HGBA1C in the last 72 hours. CBG: No results for input(s): GLUCAP in the last 168 hours. Lipid Profile: No results for input(s): CHOL, HDL, LDLCALC, TRIG, CHOLHDL, LDLDIRECT in the last 72 hours. Thyroid Function Tests: No results for input(s): TSH, T4TOTAL, FREET4, T3FREE, THYROIDAB in the last 72 hours. Anemia Panel: No results for input(s): VITAMINB12, FOLATE, FERRITIN, TIBC, IRON, RETICCTPCT in the last 72 hours. Sepsis Labs: Recent Labs  Lab 02/08/20 0502 02/08/20 0913  PROCALCITON  --  0.34  LATICACIDVEN 0.9  --     Recent Results (from the past 240 hour(s))  Blood Culture (routine x 2)     Status: None   Collection Time: 01/31/20  8:15 PM   Specimen: BLOOD RIGHT  HAND  Result Value Ref Range Status   Specimen Description BLOOD RIGHT HAND  Final   Special Requests   Final    BOTTLES DRAWN AEROBIC AND ANAEROBIC Blood Culture adequate volume   Culture NO GROWTH 5 DAYS  Final   Report Status 02/05/2020 FINAL  Final  Urine culture     Status: Abnormal   Collection Time: 01/31/20  8:15 PM   Specimen: In/Out Cath Urine  Result Value Ref Range Status   Specimen Description IN/OUT CATH URINE  Final   Special Requests   Final    NONE Performed at [facility], [address] [place], [place] [address]    Culture MULTIPLE SPECIES PRESENT, SUGGEST RECOLLECTION (A)  Final   Report Status 02/01/2020 FINAL  Final  Blood Culture (routine x 2)     Status: None   Collection Time: 02/01/20  8:58 AM   Specimen: BLOOD RIGHT ARM  Result Value Ref Range Status   Specimen Description BLOOD RIGHT ARM  Final  Special Requests   Final    AEROBIC BOTTLE ONLY Blood Culture results may not be optimal due to an inadequate volume of blood received in culture bottles   Culture   Final    NO GROWTH 5 DAYS Performed at [facility], [place] [address] [place], [place] [address]    Report Status 02/06/2020 FINAL  Final  Blood Culture (routine x 2)     Status: Abnormal (Preliminary result)   Collection Time: 02/08/20  4:25 AM   Specimen: BLOOD RIGHT HAND  Result Value Ref Range Status   Specimen Description BLOOD RIGHT HAND  Final   Special Requests   Final    BOTTLES DRAWN AEROBIC AND ANAEROBIC Blood Culture adequate volume   Culture  Setup Time   Final    ANAEROBIC BOTTLE ONLY GRAM POSITIVE COCCI IN CLUSTERS CRITICAL RESULT CALLED TO, READ BACK BY AND VERIFIED WITH: T MEYER PHARMD 02/09/20 2007 JDW    Culture (A)  Final    STAPHYLOCOCCUS SPECIES (COAGULASE NEGATIVE) THE SIGNIFICANCE OF ISOLATING THIS ORGANISM FROM A SINGLE SET OF BLOOD CULTURES WHEN MULTIPLE SETS ARE DRAWN IS UNCERTAIN. PLEASE NOTIFY THE MICROBIOLOGY DEPARTMENT WITHIN ONE WEEK IF SPECIATION  AND SENSITIVITIES ARE REQUIRED. Performed at [facility], [place] [address] [place], [place] [address]    Report Status PENDING  Incomplete  Blood Culture (routine x 2)     Status: None (Preliminary result)   Collection Time: 02/08/20  4:40 AM   Specimen: BLOOD RIGHT ARM  Result Value Ref Range Status   Specimen Description BLOOD RIGHT ARM  Final   Special Requests   Final    BOTTLES DRAWN AEROBIC AND ANAEROBIC Blood Culture adequate volume   Culture   Final    NO GROWTH 2 DAYS Performed at [facility], [place] [address] [place], [place] [address]    Report Status PENDING  Incomplete  SARS CORONAVIRUS 2 (TAT 6-24 HRS) Nasopharyngeal Nasopharyngeal Swab     Status: None   Collection Time: 02/08/20  6:50 AM   Specimen: Nasopharyngeal Swab  Result Value Ref Range Status   SARS Coronavirus 2 NEGATIVE NEGATIVE Final    Comment: (NOTE) SARS-CoV-2 target nucleic acids are NOT DETECTED. The SARS-CoV-2 RNA is generally detectable in upper and lower respiratory specimens during the acute phase of infection. Negative results do not preclude SARS-CoV-2 infection, do not rule out co-infections with other pathogens, and should not be used as the sole basis for treatment or other patient management decisions. Negative results must be combined with clinical observations, patient history, and epidemiological information. The expected result is Negative. Fact Sheet for Patients: SugarRoll.be Fact Sheet for Healthcare Providers: https://www.woods-mathews.com/ This test is not yet approved or cleared by the [place] FDA and  has been authorized for detection and/or diagnosis of SARS-CoV-2 by FDA under an Emergency Use Authorization (EUA). This EUA will remain  in effect (meaning this test can be used) for the duration of the COVID-19 declaration under Section 56 4(b)(1) of the Act, 21 U.S.C. section 360bbb-3(b)(1), unless the authorization is  terminated or revoked sooner. Performed at [facility], [place] [address] [place],  [address]       Radiology Studies: No results found.   Scheduled Meds: . allopurinol  300 mg Oral Daily  . aspirin  81 mg Oral Daily  . brimonidine  1 drop Left Eye BID  . cycloSPORINE  1 drop Both Eyes BID  . dorzolamide  1 drop  Both Eyes Daily  . enoxaparin (LOVENOX) injection  40 mg Subcutaneous Q24H  . feeding supplement (ENSURE ENLIVE)  237 mL Oral BID BM  . FLUoxetine  40 mg Oral Daily  . gabapentin  200 mg Oral TID  . guaiFENesin  600 mg Oral BID  . hydrocortisone sod succinate (SOLU-CORTEF) inj  50 mg Intravenous Q6H  . latanoprost  1 drop Both Eyes QHS  . magnesium oxide  400 mg Oral BID  . methocarbamol  500 mg Oral QID  . metoprolol tartrate  25 mg Oral BID  . mometasone-formoterol  2 puff Inhalation BID  . multivitamin with minerals  1 tablet Oral Daily  . mycophenolate  180 mg Oral BID  . pantoprazole  40 mg Oral BID  . saccharomyces boulardii  250 mg Oral BID  . sodium chloride flush  3 mL Intravenous Q12H  . tacrolimus  0.5 mg Oral QHS  . tacrolimus  1 mg Oral Daily   Continuous Infusions: . lactated ringers 100 mL/hr at 02/10/20 0200     LOS: 2 days    Time spent: 25 minutes spent in the coordination of care today.    Jonnie Finner, DO [facility]  If 7PM-7AM, please contact night-coverage [email or web address] 02/10/2020, 2:34 PM

## 2020-02-10 NOTE — Progress Notes (Signed)
April [name] has had no new fever.  1 bottle from 1 set of the blood cultures positive with a GPC most c/w a contaminate.   No changes, continued observation off of antbiotics. I am available if needed. Thayer Headings, MD

## 2020-02-11 ENCOUNTER — Other Ambulatory Visit: Payer: Self-pay | Admitting: Physician Assistant

## 2020-02-11 ENCOUNTER — Inpatient Hospital Stay (HOSPITAL_COMMUNITY): Payer: Medicare Other

## 2020-02-11 DIAGNOSIS — Z7189 Other specified counseling: Secondary | ICD-10-CM

## 2020-02-11 DIAGNOSIS — E2740 Unspecified adrenocortical insufficiency: Secondary | ICD-10-CM

## 2020-02-11 DIAGNOSIS — I471 Supraventricular tachycardia, unspecified: Secondary | ICD-10-CM

## 2020-02-11 DIAGNOSIS — Z515 Encounter for palliative care: Secondary | ICD-10-CM

## 2020-02-11 DIAGNOSIS — D649 Anemia, unspecified: Secondary | ICD-10-CM

## 2020-02-11 DIAGNOSIS — D638 Anemia in other chronic diseases classified elsewhere: Secondary | ICD-10-CM

## 2020-02-11 LAB — COMPREHENSIVE METABOLIC PANEL
ALT: 11 U/L (ref 0–44)
AST: 21 U/L (ref 15–41)
Albumin: 2.2 g/dL — ABNORMAL LOW (ref 3.5–5.0)
Alkaline Phosphatase: 98 U/L (ref 38–126)
Anion gap: 10 (ref 5–15)
BUN: 28 mg/dL — ABNORMAL HIGH (ref 8–23)
CO2: 21 mmol/L — ABNORMAL LOW (ref 22–32)
Calcium: 9 mg/dL (ref 8.9–10.3)
Chloride: 103 mmol/L (ref 98–111)
Creatinine, Ser: 1.05 mg/dL — ABNORMAL HIGH (ref 0.44–1.00)
GFR calc Af Amer: >60 mL/min (ref >60)
GFR calc non Af Amer: 57 mL/min — ABNORMAL LOW (ref >60)
Glucose, Bld: 159 mg/dL — ABNORMAL HIGH (ref 70–99)
Potassium: 4.2 mmol/L (ref 3.5–5.1)
Sodium: 134 mmol/L — ABNORMAL LOW (ref 135–145)
Total Bilirubin: 0.8 mg/dL (ref 0.3–1.2)
Total Protein: 5.9 g/dL — ABNORMAL LOW (ref 6.5–8.1)

## 2020-02-11 LAB — CBC WITH DIFFERENTIAL/PLATELET
Abs Immature Granulocytes: 0.02 10*3/uL (ref 0.00–0.07)
Basophils Absolute: 0 10*3/uL (ref 0.0–0.1)
Basophils Relative: 0 %
Eosinophils Absolute: 0 10*3/uL (ref 0.0–0.5)
Eosinophils Relative: 0 %
HCT: 25.6 % — ABNORMAL LOW (ref 36.0–46.0)
Hemoglobin: 7.3 g/dL — ABNORMAL LOW (ref 12.0–15.0)
Immature Granulocytes: 0 %
Lymphocytes Relative: 8 %
Lymphs Abs: 0.6 10*3/uL — ABNORMAL LOW (ref 0.7–4.0)
MCH: 27.5 pg (ref 26.0–34.0)
MCHC: 28.5 g/dL — ABNORMAL LOW (ref 30.0–36.0)
MCV: 96.6 fL (ref 80.0–100.0)
Monocytes Absolute: 0.2 10*3/uL (ref 0.1–1.0)
Monocytes Relative: 2 %
Neutro Abs: 6.5 10*3/uL (ref 1.7–7.7)
Neutrophils Relative %: 90 %
Platelets: 153 10*3/uL (ref 150–400)
RBC: 2.65 MIL/uL — ABNORMAL LOW (ref 3.87–5.11)
RDW: 21 % — ABNORMAL HIGH (ref 11.5–15.5)
WBC: 7.3 10*3/uL (ref 4.0–10.5)
nRBC: 0 % (ref 0.0–0.2)

## 2020-02-11 LAB — CULTURE, BLOOD (ROUTINE X 2): Special Requests: ADEQUATE

## 2020-02-11 LAB — MAGNESIUM: Magnesium: 1.5 mg/dL — ABNORMAL LOW (ref 1.7–2.4)

## 2020-02-11 MED ORDER — METOPROLOL TARTRATE 5 MG/5ML IV SOLN
5.0000 mg | Freq: Once | INTRAVENOUS | Status: AC
  Administered 2020-02-11: 5 mg via INTRAVENOUS

## 2020-02-11 MED ORDER — VERAPAMIL HCL ER 120 MG PO TBCR: 180.0000 mg | EXTENDED_RELEASE_TABLET | Freq: Every day | ORAL | Status: DC

## 2020-02-11 MED ORDER — METOPROLOL TARTRATE 5 MG/5ML IV SOLN
INTRAVENOUS | Status: AC
  Filled 2020-02-11: qty 5

## 2020-02-11 MED ORDER — HYDROCORTISONE NA SUCCINATE PF 100 MG IJ SOLR
50.0000 mg | Freq: Every day | INTRAMUSCULAR | Status: DC
  Administered 2020-02-12 – 2020-02-13 (×2): 50 mg via INTRAVENOUS
  Filled 2020-02-11 (×2): qty 2

## 2020-02-11 MED ORDER — MAGNESIUM SULFATE 2 GM/50ML IV SOLN
2.0000 g | Freq: Once | INTRAVENOUS | Status: AC
  Administered 2020-02-11: 2 g via INTRAVENOUS
  Filled 2020-02-11: qty 50

## 2020-02-11 MED ORDER — ADENOSINE 6 MG/2ML IV SOLN
INTRAVENOUS | Status: AC
  Administered 2020-02-11: 6 mg
  Filled 2020-02-11: qty 2

## 2020-02-11 MED ORDER — ADENOSINE 6 MG/2ML IV SOLN
INTRAVENOUS | Status: AC
  Administered 2020-02-11: 12 mg
  Filled 2020-02-11: qty 4

## 2020-02-11 MED ORDER — VERAPAMIL HCL ER 180 MG PO TBCR
180.0000 mg | EXTENDED_RELEASE_TABLET | Freq: Every day | ORAL | Status: DC
  Administered 2020-02-11 – 2020-02-23 (×13): 180 mg via ORAL
  Filled 2020-02-11 (×15): qty 1
  Filled 2020-02-11: qty 1.5

## 2020-02-11 NOTE — Progress Notes (Signed)
April [name]  PROGRESS NOTE    April [name]  ZJQ:[id number] DOB: Dec 21, 1955 DOA: 02/08/2020 PCP: [name], MD   Brief Narrative:   April [name] a 64 y.o.femalewith medical history significant ofPCKD s/p renal transplant; HLD; anal condyloma; and CHF presenting with SOB/nausea. She was previously hospitalized from 3/10-16 for sepsis of uncertain etiology in a transplant recipient - possibly from mucopurulent bronchitis. She had acute respiratory failure with hypoxia. ID was involved and she was treated with broad spectrum antibiotics and transitioned to IV ceftriaxone. She was recommended to go to CIR but declined and went home with home health services. She felt good when she went home - except that she was having horrible burning diarrhea "from the antibiotics." That got worse and worse and she couldn't tolerate it anymore. She didn't have as much help at home to help her clean up. She has been coughing and dry heaving. Her last BM was yesterday. Overnight, she started having a hard time breathing and couldn't quit dry heaving. She had recurrent fever at home up to 100.4. She is no longer still having cough. No urinary symptoms.   She fell in January - hip and pelvic fractures as well as wrist fracture. She went to CIR but was in too much pain to be successful. She is now bed bound. Every time she starts to get more mobile, she has a setback or illness. They recommended return to CIR after last hospitalization. She declined because she didn't have the strength to keep up with the program. She refused a SNF rehab due to her immunosuppression. She continues to feel the same about disposition.  02/11/20: No new fevers ON. WBC ok. Hgb stable. She says she still feels a little fatigued and hard to catch her breath. CXR negative. She seems euvolemic. Questioning if this is symptomatic anemia. During the morning, she had a spike in her HR into the 190's. Metoprolol 25mg  Po given,  but only came down to 140-160s. EKG showed a flutter w/ 2:1 conduction. Spoke with cardiology. I ordered for 2 rounds of adenosine (6mg  then 12mg ). She converted to sinus. Then was given 5mg  IV metoprolol and 180mg  verapimil ER. Her rates are holding now and her BP is ok.   Assessment & Plan:   Principal Problem:   Sepsis ([place]) Active Problems:   Renal transplant recipient   Chronic diastolic CHF (congestive heart failure) (HCC)   Physical deconditioning   Essential hypertension   Glaucoma  Sepsis in an immunocompromised patient, uncertain etiology - Patient with recent hospitalization, discharged on 3/16 after sepsis from uncertain source - She returned with fever, tachycardia, tachypnea concerning for recurrent sepsis - She did develop severe diarrhea with Augmentin but this appears to have abated - continue stress dose steroids for now; abx have been stopped, she is afebrile ON - Bld Cx NTD - ID onboard, appreciate assistance     - 3/20: No fevers ON. 1/4 vials with growth but c/w contamination     - decrease steroids     - 3/21: continue to wean steroids  PCKD s/p renal transplantation - continue immunosuppressants including mycophenolate and tacrolimus - continue stress dose steroids; will need to taper     - 3/20: decrease steroids     - 3/21: continue to wean steroids  HTN - continue metoprolol with hold parameters     - 3/21: d/c metoprolol and change to verapamil  Chronic diastolic CHF - continue metoprolol - euvolemic     - see  above  Glaucoma Dry eye - continue home drops  Debility - Since her fall in January, she has been essentially bed bound - She is quite deconditioned and this is unlikely to improve her long-term outcomes - PT/OT: HHPT/OT - Pt is encouraged to get OOB to chair at least q shift - palliative care consultation  Asthma - continue dulera, mucinex  Gout -  allopurinol  Chronic spasm/pain - robaxin, gabapentin  Anxiety/depression - prozac  GERD - protonix  Macrocytic anemia Thrombocytopenia     - check iron studies     - hold lovenox     - no evidence of bleed     - check hemoccult     - 3/21: Hgb is stable this AM, but I am curious if she is having symptomatic anemia with her dyspnea nonetheless  SVT     - HR up to 190s and unresponsive to metoprolol     - given 2 rounds of adenosine; converted, started on verapamil     - HR and BP is stable now  DVT prophylaxis: SCDs Code Status: FULL   Disposition Plan: Still not back to baseline. She states she is fatigued and feeling short of breath; possible d/c in AM if improved  Consultants:   Cardiology  PC   ROS:  Reports dyspnea, fatgiue . Remainder 10-pt ROS is negative for all not previously mentioned.  Subjective: "It's just hard to catch up."  Objective: Vitals:   02/11/20 1205 02/11/20 1214 02/11/20 1230 02/11/20 1450  BP: 139/88 133/63 119/65 123/63  Pulse:      Resp:  19    Temp:      TempSrc:      SpO2:      Weight:      Height:        Intake/Output Summary (Last 24 hours) at 02/11/2020 1502 Last data filed at 02/11/2020 3419 Gross per 24 hour  Intake 240 ml  Output 500 ml  Net -260 ml   Filed Weights   02/08/20 0728 02/10/20 3790  Weight: 75.5 kg 80.6 kg    Examination:  General: 64 y.o. female resting in bed in NAD Cardiovascular: RRR, +S1, S2, no m/g/r, equal pulses throughout Respiratory: CTABL, no w/r/r, slightly increased WOB GI: BS+, NDNT, no masses noted, no organomegaly noted MSK: No e/c/c Neuro: A&O x 3, no focal deficits Psyc: Appropriate interaction and affect, calm/cooperative   Data Reviewed: I have personally reviewed following labs and imaging studies.  CBC: Recent Labs  Lab 02/05/20 0318 02/05/20 0318 02/06/20 1026 02/06/20 1026 02/08/20 0435 02/08/20 0435 02/08/20 0541 02/09/20 0307 02/10/20 0327  02/10/20 0924 02/11/20 0246  WBC 5.7   < > 9.6  --  8.2  --   --  6.3 5.9  --  7.3  NEUTROABS 4.5  --  8.5*  --  6.6  --   --   --  5.0  --  6.5  HGB 7.7*   < > 8.2*   < > 8.9*   < > 9.9* 8.1* 7.2* 7.3* 7.3*  HCT 27.3*   < > 28.8*   < > 31.6*   < > 29.0* 27.7* 24.5* 25.4* 25.6*  MCV 96.8   < > 98.3  --  99.1  --   --  97.5 95.3  --  96.6  PLT 190   < > 211  --  197  --   --  145* 131*  --  153   < > =  values in this interval not displayed.   Basic Metabolic Panel: Recent Labs  Lab 02/05/20 0318 02/05/20 0318 02/06/20 1026 02/06/20 1026 02/08/20 0435 02/08/20 0541 02/09/20 0307 02/10/20 0327 02/11/20 0246  NA 140   < > 140   < > 135 134* 136 133* 134*  K 3.8   < > 4.0   < > 4.0 4.1 4.5 4.2 4.2  CL 107   < > 107   < > 101 101 104 103 103  CO2 25   < > 22  --  22  --  22 20* 21*  GLUCOSE 115*   < > 185*   < > 102* 91 112* 143* 159*  BUN 24*   < > 20   < > 14 15 18  26* 28*  CREATININE 0.93   < > 0.88   < > 0.72 0.60 0.81 0.98 1.05*  CALCIUM 9.3   < > 9.3  --  9.4  --  9.0 8.9 9.0  MG 1.9  --  1.4*  --   --   --   --  1.6* 1.5*  PHOS 2.5  --  1.9*  --   --   --   --   --   --    < > = values in this interval not displayed.   GFR: Estimated Creatinine Clearance: 54.6 mL/min (A) (by C-G formula based on SCr of 1.05 mg/dL (H)). Liver Function Tests: Recent Labs  Lab 02/05/20 0318 02/06/20 1026 02/08/20 0435 02/10/20 0327 02/11/20 0246  AST 13* 16 15 13* 21  ALT 9 9 10 8 11   ALKPHOS 114 118 117 83 98  BILITOT 0.9 1.1 1.6* 1.1 0.8  PROT 6.3* 6.7 6.8 5.7* 5.9*  ALBUMIN 2.5* 2.6* 2.7* 2.1* 2.2*   No results for input(s): LIPASE, AMYLASE in the last 168 hours. No results for input(s): AMMONIA in the last 168 hours. Coagulation Profile: Recent Labs  Lab 02/08/20 0435  INR 1.2   Cardiac Enzymes: No results for input(s): CKTOTAL, CKMB, CKMBINDEX, TROPONINI in the last 168 hours. BNP (last 3 results) No results for input(s): PROBNP in the last 8760 hours. HbA1C: No  results for input(s): HGBA1C in the last 72 hours. CBG: No results for input(s): GLUCAP in the last 168 hours. Lipid Profile: No results for input(s): CHOL, HDL, LDLCALC, TRIG, CHOLHDL, LDLDIRECT in the last 72 hours. Thyroid Function Tests: No results for input(s): TSH, T4TOTAL, FREET4, T3FREE, THYROIDAB in the last 72 hours. Anemia Panel: Recent Labs    02/10/20 0924  FERRITIN 236  TIBC 197*  IRON 42   Sepsis Labs: Recent Labs  Lab 02/08/20 0502 02/08/20 0913  PROCALCITON  --  0.34  LATICACIDVEN 0.9  --     Recent Results (from the past 240 hour(s))  Blood Culture (routine x 2)     Status: Abnormal   Collection Time: 02/08/20  4:25 AM   Specimen: BLOOD RIGHT HAND  Result Value Ref Range Status   Specimen Description BLOOD RIGHT HAND  Final   Special Requests   Final    BOTTLES DRAWN AEROBIC AND ANAEROBIC Blood Culture adequate volume   Culture  Setup Time   Final    ANAEROBIC BOTTLE ONLY GRAM POSITIVE COCCI IN CLUSTERS CRITICAL RESULT CALLED TO, READ BACK BY AND VERIFIED WITH: T MEYER PHARMD 02/09/20 2007 JDW    Culture (A)  Final    STAPHYLOCOCCUS SPECIES (COAGULASE NEGATIVE) THE SIGNIFICANCE OF ISOLATING THIS ORGANISM FROM A SINGLE SET  OF BLOOD CULTURES WHEN MULTIPLE SETS ARE DRAWN IS UNCERTAIN. PLEASE NOTIFY THE MICROBIOLOGY DEPARTMENT WITHIN ONE WEEK IF SPECIATION AND SENSITIVITIES ARE REQUIRED. Performed at [facility], [place] [address] [place], [place] [address]    Report Status 02/11/2020 FINAL  Final  Blood Culture (routine x 2)     Status: None (Preliminary result)   Collection Time: 02/08/20  4:40 AM   Specimen: BLOOD RIGHT ARM  Result Value Ref Range Status   Specimen Description BLOOD RIGHT ARM  Final   Special Requests   Final    BOTTLES DRAWN AEROBIC AND ANAEROBIC Blood Culture adequate volume   Culture   Final    NO GROWTH 3 DAYS Performed at [facility], [place] [address], [place], [place] [address]    Report Status PENDING  Incomplete   SARS CORONAVIRUS 2 (TAT 6-24 HRS) Nasopharyngeal Nasopharyngeal Swab     Status: None   Collection Time: 02/08/20  6:50 AM   Specimen: Nasopharyngeal Swab  Result Value Ref Range Status   SARS Coronavirus 2 NEGATIVE NEGATIVE Final    Comment: (NOTE) SARS-CoV-2 target nucleic acids are NOT DETECTED. The SARS-CoV-2 RNA is generally detectable in upper and lower respiratory specimens during the acute phase of infection. Negative results do not preclude SARS-CoV-2 infection, do not rule out co-infections with other pathogens, and should not be used as the sole basis for treatment or other patient management decisions. Negative results must be combined with clinical observations, patient history, and epidemiological information. The expected result is Negative. Fact Sheet for Patients: SugarRoll.be Fact Sheet for Healthcare Providers: https://www.woods-mathews.com/ This test is not yet approved or cleared by the [place] FDA and  has been authorized for detection and/or diagnosis of SARS-CoV-2 by FDA under an Emergency Use Authorization (EUA). This EUA will remain  in effect (meaning this test can be used) for the duration of the COVID-19 declaration under Section 56 4(b)(1) of the Act, 21 U.S.C. section 360bbb-3(b)(1), unless the authorization is terminated or revoked sooner. Performed at [facility], [place] [address] [place], [place] [address]       Radiology Studies: DG CHEST PORT 1 VIEW  Result Date: 02/11/2020 CLINICAL DATA:  Sepsis.  Shortness of breath. EXAM: PORTABLE CHEST 1 VIEW COMPARISON:  February 08, 2020 FINDINGS: Stable cardiomegaly. No pneumothorax. No nodules or masses. No focal infiltrates. IMPRESSION: No active disease. Electronically Signed   By: Dorise Bullion III M.D   On: 02/11/2020 10:27     Scheduled Meds: . allopurinol  300 mg Oral Daily  . aspirin  81 mg Oral Daily  . brimonidine  1 drop Left Eye BID   . cycloSPORINE  1 drop Both Eyes BID  . dorzolamide  1 drop Both Eyes Daily  . feeding supplement (ENSURE ENLIVE)  237 mL Oral BID BM  . FLUoxetine  40 mg Oral Daily  . gabapentin  200 mg Oral TID  . guaiFENesin  600 mg Oral BID  . hydrocortisone sod succinate (SOLU-CORTEF) inj  50 mg Intravenous Q12H  . latanoprost  1 drop Both Eyes QHS  . methocarbamol  500 mg Oral QID  . metoprolol tartrate      . mometasone-formoterol  2 puff Inhalation BID  . multivitamin with minerals  1 tablet Oral Daily  . mycophenolate  180 mg Oral BID  . pantoprazole  40 mg Oral BID  . saccharomyces boulardii  250 mg Oral BID  . sodium chloride flush  3 mL Intravenous  Q12H  . tacrolimus  0.5 mg Oral QHS  . tacrolimus  1 mg Oral Daily  . verapamil  180 mg Oral Daily   Continuous Infusions: . lactated ringers 100 mL/hr at 02/11/20 0816     LOS: 3 days    Time spent: 45 minutes spent in the coordination of care today.    Jonnie Finner, DO [facility]  If 7PM-7AM, please contact night-coverage [email or web address] 02/11/2020, 3:02 PM

## 2020-02-11 NOTE — Progress Notes (Signed)
OT Cancellation Note  Patient Details Name: April [name] MRN: [id number] DOB: 10/10/1956   Cancelled Treatment:    Reason Eval/Treat Not Completed: Patient at procedure or test/ unavailable(Pt in SVT and RN holding pt from therapy.)  OT to continue to follow.  Jefferey Pica, OTR/L Acute Rehabilitation Services Pager: [phone] Office: [phone]   Jefferey Pica 02/11/2020, 3:35 PM

## 2020-02-11 NOTE — Consult Note (Signed)
Consultation Note Date: 02/11/2020   Patient Name: April [name]  DOB: [birth year]  MRN: [id number]  Age / Sex: 64 y.o., female  PCP: [name], MD Referring Physician: Jonnie Finner, DO  Reason for Consultation: Establishing goals of care and Psychosocial/spiritual support  HPI/Patient Profile: 64 y.o. female  with past medical history of kidney transplant on immunosuppression, CHF, two significant falls with fractures - one in January 2020 and the second in January 2021,  who was admitted on 02/08/2020 with shortness of breath, nausea and diarrhea.  She was admitted and treated for sepsis.  She has unfortunately had SVT and required adenosine x 2 today.  Clinical Assessment and Goals of Care:  I have reviewed medical records including EPIC notes, labs and imaging, received report from Dr. Marylyn Ishihara, examined the patient and met at bedside with her  to discuss diagnosis prognosis, GOC, EOL wishes, disposition and options.  I introduced Palliative Medicine as specialized medical care for people living with serious illness. It focuses on providing relief from the symptoms and stress of a serious illness.   We discussed a brief life review of the patient. 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  She currently lives at home with her husband (who recently fractured his hip and then knee) and her mother.  She has 2 daughters.  One of whom is an OT.  She seems to have a realistic but positive perspective on life.  Patient seems to have a relatively good understanding of her health - she praises Dr. Marylyn Ishihara for his communication style.  She tells me about her adrenal insufficiency, bone disease, anemia, kidney disease.  She understands the balancing act between avoiding SVT and treating adrenal insufficiency.  We discussed her current illness and what it means in  the larger context of her on-going co-morbidities.  Natural disease trajectory and expectations at EOL were discussed.  I attempted to elicit values and goals of care important to the patient.   The difference between aggressive medical intervention and comfort care was considered in light of the patient's goals of care.  Advanced directives, concepts specific to code status, artifical feeding and hydration, and rehospitalization were considered and discussed.  Hospice and Palliative Care services outpatient were explained and offered.  Questions and concerns were addressed.  The family was encouraged to call with questions or concerns.    Primary Decision Maker:  PATIENT  The patient would like for her daughter (the OT) rather than the husband to be her surrogate.      SUMMARY OF RECOMMENDATIONS    Full code full scope Palliative Care to follow up outpatient Hard Choices book provided for education Reviewed Advanced Directives and will touch base again tomorrow.  Code Status/Advance Care Planning:  Full code.    Patient would like for both she and her husband to complete and Advance Directive   Symptom Management:   Per primary  Additional Recommendations (Limitations, Scope, Preferences):  Full Scope Treatment  Palliative Prophylaxis:   Frequent Pain Assessment  Psycho-social/Spiritual:   Desire for further Chaplaincy support: requested.  Prognosis:  Unable to determine    Discharge Planning: Home with Palliative Services and Home Health      Primary Diagnoses: Present on Admission: . Sepsis ([place]) . Chronic diastolic CHF (congestive heart failure) ([place]) . Physical deconditioning . Essential hypertension . Glaucoma   I have reviewed the medical record, interviewed the patient and family, and examined the patient. The following aspects are pertinent.  Past Medical History:  Diagnosis Date  . Arthritis   . Asthma   . CHF (congestive heart failure)  ([place])   . Depression   . Essential hypertension 02/08/2020  . GERD (gastroesophageal reflux disease)   . Hyperlipidemia   . Hyperparathyroidism   . Polycystic kidney   . PONV (postoperative nausea and vomiting)    Social History   Socioeconomic History  . Marital status: Married    Spouse name: Not on file  . Number of children: Not on file  . Years of education: Not on file  . Highest education level: Not on file  Occupational History  . Occupation: retired  Tobacco Use  . Smoking status: Former Smoker    Packs/day: 0.25    Years: 15.00    Pack years: 3.75    Types: Cigarettes    Quit date: 03/22/1998    Years since quitting: 21.9  . Smokeless tobacco: Never Used  Substance and Sexual Activity  . Alcohol use: No    Comment: social  . Drug use: No  . Sexual activity: Not on file  Other Topics Concern  . Not on file  Social History Narrative   Married, lives with spouse, son and mother   2 children > son and daughter   OCCUPATION: retired Education officer, museum   Social Determinants of Radio broadcast assistant Strain:   . Difficulty of Paying Living Expenses:   Food Insecurity:   . Worried About Charity fundraiser in the Last Year:   . Arboriculturist in the Last Year:   Transportation Needs:   . Film/video editor (Medical):   [name] Lack of Transportation (Non-Medical):   Physical Activity:   . Days of Exercise per Week:   . Minutes of Exercise per Session:   Stress:   . Feeling of Stress :   Social Connections:   . Frequency of Communication with Friends and Family:   . Frequency of Social Gatherings with Friends and Family:   . Attends Religious Services:   . Active Member of Clubs or Organizations:   . Attends Archivist Meetings:   [name] Marital Status:    Family History  Problem Relation Age of Onset  . Hypertension Mother   . Heart disease Father        CABG history  . Polycystic kidney disease Father   . Polycystic kidney disease Brother     Scheduled Meds: . allopurinol  300 mg Oral Daily  . aspirin  81 mg Oral Daily  . brimonidine  1 drop Left Eye BID  . cycloSPORINE  1 drop Both Eyes BID  . dorzolamide  1 drop Both Eyes Daily  . feeding supplement (ENSURE ENLIVE)  237 mL Oral BID BM  . FLUoxetine  40 mg Oral Daily  . gabapentin  200 mg Oral TID  . guaiFENesin  600 mg Oral BID  . [START ON 02/12/2020] hydrocortisone sod succinate (SOLU-CORTEF) inj  50 mg Intravenous  Daily  . latanoprost  1 drop Both Eyes QHS  . methocarbamol  500 mg Oral QID  . metoprolol tartrate      . mometasone-formoterol  2 puff Inhalation BID  . multivitamin with minerals  1 tablet Oral Daily  . mycophenolate  180 mg Oral BID  . pantoprazole  40 mg Oral BID  . saccharomyces boulardii  250 mg Oral BID  . sodium chloride flush  3 mL Intravenous Q12H  . tacrolimus  0.5 mg Oral QHS  . tacrolimus  1 mg Oral Daily  . verapamil  180 mg Oral Daily   Continuous Infusions: . lactated ringers 100 mL/hr at 02/11/20 0816   PRN Meds:.acetaminophen **OR** acetaminophen, albuterol, HYDROcodone-acetaminophen, ondansetron **OR** ondansetron (ZOFRAN) IV Allergies  Allergen Reactions  . Infed [Iron Dextran] Other (See Comments)    Chest tightness  . Pentamidine Itching, Shortness Of Breath and Swelling  . Erythromycin [Erythromycin] Other (See Comments)    Mouth Ulcers  . Iohexol Other (See Comments)    Per patient "she has had a kidney transplant and should never have contrast"  . Oxycodone Nausea Only and Nausea And Vomiting  . Erythromycin Rash    Causes breakout in mouth  . Ultram [Tramadol Hcl] Anxiety    Review of Systems  Denies pain, SOB, anxiety at this time.  Physical Exam  Well developed very pleasant female A&O x 4 coherent, cooperative.  Vital Signs: BP 119/67 (BP Location: Right Arm)   Pulse 75   Temp 97.8 F (36.6 C)   Resp 16   Ht 5' 2"  (1.575 m)   Wt 80.6 kg   SpO2 100%   BMI 32.50 kg/m  Pain Scale: 0-10   Pain Score:  2    SpO2: SpO2: 100 % O2 Device:SpO2: 100 % O2 Flow Rate: .O2 Flow Rate (L/min): 2 L/min  IO: Intake/output summary:   Intake/Output Summary (Last 24 hours) at 02/11/2020 2007 Last data filed at 02/11/2020 1800 Gross per 24 hour  Intake 1940 ml  Output 500 ml  Net 1440 ml    LBM: Last BM Date: 02/11/20 Baseline Weight: Weight: 75.5 kg Most recent weight: Weight: 80.6 kg     Palliative Assessment/Data: 50%     Time In: 3:00 Time Out: 4:00 Time Total: 60 min. Visit consisted of counseling and education dealing with the complex and emotionally intense issues surrounding the need for palliative care and symptom management in the setting of serious and potentially life-threatening illness. Greater than 50%  of this time was spent counseling and coordinating care related to the above assessment and plan.  Signed by: Florentina Jenny, PA-C Palliative Medicine  Please contact Palliative Medicine Team phone at [phone] for questions and concerns.  For individual provider: See Shea Evans

## 2020-02-11 NOTE — Significant Event (Signed)
Rapid Response Event Note  Overview: Time Called: 1153 Arrival Time: 1155 Event Type: Cardiac Tachycardic, HR up to 198 bpm. Called to bedside to assist with Adenosine administration.  Interventions: -Adenosine 6mg  IV given with no response in heart rate -Adenosine 12 mg IV. HR now 80-90 bpm. 12 lead EKG completed.   Dr. Marylyn Ishihara at bedside for Adenosine administration, code cart at bedside, and pt being monitored on continuous telemetry and Zoll. Pt states she feels better now that her heart rate is 80-90 bpm. 12 lead EKG showed NSR, given to Dr. Marylyn Ishihara. BP trended before and after Adenosine pushes, see flowsheet.  Call rapid response for further needs.   Event Summary: Name of Physician Notified: Dr. Kandace Blitz at bedside) at   Outcome: Stayed in room and stabalized Event End Time: [place]

## 2020-02-12 ENCOUNTER — Inpatient Hospital Stay (HOSPITAL_COMMUNITY): Payer: Medicare Other

## 2020-02-12 LAB — CBC WITH DIFFERENTIAL/PLATELET
Abs Immature Granulocytes: 0.07 10*3/uL (ref 0.00–0.07)
Basophils Absolute: 0 10*3/uL (ref 0.0–0.1)
Basophils Relative: 0 %
Eosinophils Absolute: 0 10*3/uL (ref 0.0–0.5)
Eosinophils Relative: 0 %
HCT: 27 % — ABNORMAL LOW (ref 36.0–46.0)
Hemoglobin: 7.7 g/dL — ABNORMAL LOW (ref 12.0–15.0)
Immature Granulocytes: 1 %
Lymphocytes Relative: 8 %
Lymphs Abs: 0.8 10*3/uL (ref 0.7–4.0)
MCH: 28.3 pg (ref 26.0–34.0)
MCHC: 28.5 g/dL — ABNORMAL LOW (ref 30.0–36.0)
MCV: 99.3 fL (ref 80.0–100.0)
Monocytes Absolute: 0.4 10*3/uL (ref 0.1–1.0)
Monocytes Relative: 4 %
Neutro Abs: 9 10*3/uL — ABNORMAL HIGH (ref 1.7–7.7)
Neutrophils Relative %: 87 %
Platelets: 182 10*3/uL (ref 150–400)
RBC: 2.72 MIL/uL — ABNORMAL LOW (ref 3.87–5.11)
RDW: 21.2 % — ABNORMAL HIGH (ref 11.5–15.5)
WBC: 10.4 10*3/uL (ref 4.0–10.5)
nRBC: 0 % (ref 0.0–0.2)

## 2020-02-12 LAB — RENAL FUNCTION PANEL
Albumin: 2.2 g/dL — ABNORMAL LOW (ref 3.5–5.0)
Anion gap: 10 (ref 5–15)
BUN: 25 mg/dL — ABNORMAL HIGH (ref 8–23)
CO2: 22 mmol/L (ref 22–32)
Calcium: 9.1 mg/dL (ref 8.9–10.3)
Chloride: 106 mmol/L (ref 98–111)
Creatinine, Ser: 0.96 mg/dL (ref 0.44–1.00)
GFR calc Af Amer: >60 mL/min (ref >60)
GFR calc non Af Amer: >60 mL/min (ref >60)
Glucose, Bld: 127 mg/dL — ABNORMAL HIGH (ref 70–99)
Phosphorus: 2 mg/dL — ABNORMAL LOW (ref 2.5–4.6)
Potassium: 3.9 mmol/L (ref 3.5–5.1)
Sodium: 138 mmol/L (ref 135–145)

## 2020-02-12 LAB — MAGNESIUM: Magnesium: 1.8 mg/dL (ref 1.7–2.4)

## 2020-02-12 LAB — D-DIMER, QUANTITATIVE: D-Dimer, Quant: 2.05 ug/mL-FEU — ABNORMAL HIGH (ref 0.00–0.50)

## 2020-02-12 MED ORDER — K PHOS MONO-SOD PHOS DI & MONO 155-852-130 MG PO TABS
500.0000 mg | ORAL_TABLET | Freq: Three times a day (TID) | ORAL | Status: DC
  Administered 2020-02-12 – 2020-02-20 (×23): 500 mg via ORAL
  Filled 2020-02-12 (×28): qty 2

## 2020-02-12 MED ORDER — CLONAZEPAM 0.25 MG PO TBDP
0.2500 mg | ORAL_TABLET | Freq: Two times a day (BID) | ORAL | Status: DC
  Administered 2020-02-12 – 2020-02-23 (×22): 0.25 mg via ORAL
  Filled 2020-02-12 (×22): qty 1

## 2020-02-12 NOTE — Progress Notes (Signed)
[facility]) Outpatient Palliative  Referral received for outpatient palliative care once pt d/c's home.  [facility] will follow and assist with outpatient palliative once d/c date is closer.  Venia Carbon RN, BSN, [facility] Liaison (in [place]) [phone]

## 2020-02-12 NOTE — Progress Notes (Signed)
Physical Therapy Treatment Patient Details Name: April [name] MRN: [id number] DOB: 03/14/1956 Today's Date: 02/12/2020    History of Present Illness Pt is a 64 y.o. female with PMH significant of PCKD s/p renal transplant; HLD; anal condyloma; CHF presenting with SOB/nausea.  She was previously hospitalized from 3/10-16 for sepsis of uncertain etiology in a transplant recipient - possibly from mucopurulent bronchitis.  She had acute respiratory failure with hypoxia. She was recommended to go to CIR but declined and went home with [facility].  Pt fell in January with subsequent L hip and wrist fxs. Pt started experiencing SOB at home and a fever.    PT Comments    With encouragement and humor pt did very well today and was able to complete 2 stands, stand pvt, and standing exercises. Pt desires to return home and states she's going home once she gets stronger. Discussed going back to CIR as pt is stronger than previous admission. Pt lives with 87 yo mother and would need to be a supervision level for safe transition home. Acute PT to cont to follow.    Follow Up Recommendations  CIR(pt in better condition and could tolerate)     Equipment Recommendations  None recommended by PT    Recommendations for Other Services       Precautions / Restrictions Precautions Precautions: Fall Precaution Comments: L wrist brace for comfort Restrictions Weight Bearing Restrictions: No LUE Weight Bearing: Weight bearing as tolerated LLE Weight Bearing: Weight bearing as tolerated    Mobility  Bed Mobility Overal bed mobility: Needs Assistance Bed Mobility: Supine to Sit     Supine to sit: Mod assist;HOB elevated     General bed mobility comments: max encouragement to push up with R UE, increased time, pt used R LE to assist L LE off EOB, assist for trunk elevation  Transfers Overall transfer level: Needs assistance Equipment used: Rolling walker (2 wheeled) Transfers: Sit to/from [employer] Sit to Stand: Mod assist;+2 physical assistance;Max assist Stand pivot transfers: Max assist;+2 physical assistance       General transfer comment: maxAx2 for initial stand, modAx2 for second stand from chair. Pt with noted L UE and LE weakness requriing assist at buttock to achieve full upright standing. Pt with noted L Knee buckling requiring blocking, pt requiring minA for L LE advancement during std pvt  Ambulation/Gait             General Gait Details: uable at this time   Marine scientist [place] (Stroke Patients Only)       Balance Overall balance assessment: Needs assistance Sitting-balance support: Feet supported;No upper extremity supported Sitting balance-Leahy Scale: Fair Sitting balance - Comments: pt able to maintain EOB balance withou physical assist   Standing balance support: During functional activity Standing balance-Leahy Scale: Poor Standing balance comment: dependent on RW and physical assist                            Cognition Arousal/Alertness: Awake/alert Behavior During Therapy: WFL for tasks assessed/performed Overall Cognitive Status: Within Functional Limits for tasks assessed                                 General Comments: pt can be self limiting but responds well to encouragement and humor,  Exercises Other Exercises Other Exercises: worked on weight shifting to L in standing and then marching in place    General Comments General comments (skin integrity, edema, etc.): pt with noted SOB however SpO2 >97% on 3lO2 via [place]      Pertinent Vitals/Pain Pain Assessment: Faces Faces Pain Scale: Hurts a little bit Pain Location: L wrist Pain Descriptors / Indicators: Discomfort;Sore Pain Intervention(s): Monitored during session    Home Living                      Prior Function            PT Goals (current goals can now be found in  the care plan section) Progress towards PT goals: Progressing toward goals    Frequency    Min 3X/week      PT Plan Current plan remains appropriate    Co-evaluation PT/OT/SLP Co-Evaluation/Treatment: Yes Reason for Co-Treatment: Complexity of the patient's impairments (multi-system involvement) PT goals addressed during session: Mobility/safety with mobility        AM-PAC PT "6 Clicks" Mobility   Outcome Measure  Help needed turning from your back to your side while in a flat bed without using bedrails?: A Little Help needed moving from lying on your back to sitting on the side of a flat bed without using bedrails?: A Little Help needed moving to and from a bed to a chair (including a wheelchair)?: A Lot Help needed standing up from a chair using your arms (e.g., wheelchair or bedside chair)?: A Lot Help needed to walk in hospital room?: Total Help needed climbing 3-5 steps with a railing? : Total 6 Click Score: 12    End of Session Equipment Utilized During Treatment: Gait belt;Oxygen Activity Tolerance: Patient limited by fatigue;Patient tolerated treatment well Patient left: with call bell/phone within reach;in chair Nurse Communication: Mobility status;Other (comment) PT Visit Diagnosis: Muscle weakness (generalized) (M62.81);History of falling (Z91.81);Difficulty in walking, not elsewhere classified (R26.2)     Time: 7482-7078 PT Time Calculation (min) (ACUTE ONLY): 35 min  Charges:  $Gait Training: 8-22 mins                     Kittie Plater, PT, DPT Acute Rehabilitation Services Pager #: [phone] Office #: [phone]    Berline Lopes 02/12/2020, 3:04 PM

## 2020-02-12 NOTE — Progress Notes (Signed)
April [name]  PROGRESS NOTE    April [name]  TDD:[id number] DOB: [birth year] DOA: 02/08/2020 PCP: [name], MD   Brief Narrative:   April [name] a 64 y.o.femalewith medical history significant ofPCKD s/p renal transplant; HLD; anal condyloma; and CHF presenting with SOB/nausea. She was previously hospitalized from 3/10-16 for sepsis of uncertain etiology in a transplant recipient - possibly from mucopurulent bronchitis. She had acute respiratory failure with hypoxia. ID was involved and she was treated with broad spectrum antibiotics and transitioned to IV ceftriaxone. She was recommended to go to CIR but declined and went home with home health services. She felt good when she went home - except that she was having horrible burning diarrhea "from the antibiotics." That got worse and worse and she couldn't tolerate it anymore. She didn't have as much help at home to help her clean up. She has been coughing and dry heaving. Her last BM was yesterday. Overnight, she started having a hard time breathing and couldn't quit dry heaving. She had recurrent fever at home up to 100.4. She is no longer still having cough. No urinary symptoms.   She fell in January - hip and pelvic fractures as well as wrist fracture. She went to CIR but was in too much pain to be successful. She is now bed bound. Every time she starts to get more mobile, she has a setback or illness. They recommended return to CIR after last hospitalization. She declined because she didn't have the strength to keep up with the program. She refused a SNF rehab due to her immunosuppression. She continues to feel the same about disposition.  02/12/20: No new fevers ON. She is still feeling somewhat breathless. CXR was negative. D-dimer is up. Will check VQ scan. Worked with PT/TO. They recommended CIR. Will speak with pt about this option. Get her some phos today.    Assessment & Plan:   Principal Problem:   Sepsis  ([place]) Active Problems:   Renal transplant recipient   Chronic diastolic CHF (congestive heart failure) (HCC)   Physical deconditioning   Essential hypertension   Glaucoma   SVT (supraventricular tachycardia) (HCC)   Anemia of chronic disease   Palliative care encounter   Goals of care, counseling/discussion  Sepsis in an immunocompromised patient, uncertain etiology - Patient with recent hospitalization, discharged on 3/16 after sepsis from uncertain source - She returned with fever, tachycardia, tachypnea concerning for recurrent sepsis - She did develop severe diarrhea with Augmentin but this appears to have abated - continue stress dose steroids for now; abx have been stopped, she is afebrile ON - Bld Cx NTD - ID onboard, appreciate assistance - 3/20: No fevers ON. 1/4 vials with growth but c/w contamination - decrease steroids     - 3/21: continue to wean steroids     - 3/22: drop to prednisone 40 in AM and wean over next several days to her baseline use  PCKD s/p renal transplantation - continue immunosuppressants including mycophenolate and tacrolimus - continue stress dose steroids; will need to taper - 3/20: decrease steroids     - 3/21: continue to wean steroids  HTN - continue metoprololwith hold parameters     - 3/21: d/c metoprolol and change to verapamil  Chronic diastolic CHF - continue metoprolol - euvolemic - see above  Glaucoma Dry eye - continue home drops  Debility - Since her fall in January, she has been essentially bed bound - She is quite deconditioned and this is unlikely to  improve her long-term outcomes - PT/OT: HHPT/OT - Pt is encouraged to get OOB to chair at least q shift - palliative care consultation     - 3/22: PT/OT re-eval'd and rec'd CIR. Will speak with CM about this.   Asthma - continue dulera, mucinex  Gout -  allopurinol  Chronic spasm/pain - robaxin, gabapentin  Anxiety/depression - prozac  GERD - protonix  Macrocytic anemia Thrombocytopenia - check iron studies - hold lovenox - no evidence of bleed - check hemoccult     - 3/21: Hgb is stable this AM, but I am curious if she is having symptomatic anemia with her dyspnea nonetheless     - Hgb is improved, monitor  SVT     - HR up to 190s and unresponsive to metoprolol     - given 2 rounds of adenosine; converted, started on verapamil     - HR and BP is stable now     - 3/22: she is in sinus and tolerating verapamil, continue  Dyspnea     - CXR is ok     - d-dimer is up; check VQ scan     - anxiety may be playing a roll; add some klonopin  DVT prophylaxis: SCDs Code Status: FULL   Disposition Plan: Still not back to baseline; she states she is still dyspneic  Consultants:   PC   ROS:  Reports dyspnea . Remainder 10-pt ROS is negative for all not previously mentioned.  Subjective: "I still feel like I can't catch my breath"  Objective: Vitals:   02/11/20 2051 02/11/20 2200 02/11/20 2229 02/12/20 0758  BP:   117/61   Pulse:   62   Resp:  (!) 21 18 (!) 25  Temp:   98.2 F (36.8 C)   TempSrc:   Oral   SpO2: 100%  100% 99%  Weight:      Height:        Intake/Output Summary (Last 24 hours) at 02/12/2020 1539 Last data filed at 02/12/2020 0600 Gross per 24 hour  Intake 2843.23 ml  Output 350 ml  Net 2493.23 ml   Filed Weights   02/08/20 0728 02/10/20 0613  Weight: 75.5 kg 80.6 kg    Examination:  General: 64 y.o. female resting in bed in NAD Cardiovascular: RRR, +S1, S2, no m/g/r Respiratory: CTABL, no w/r/r, increased WOB GI: BS+, NDNT, no masses noted, no organomegaly noted MSK: No e/c/c Neuro: A&O x 3, no focal deficits Psyc: Appropriate interaction and affect, calm/cooperative   Data Reviewed: I have personally reviewed following labs and imaging  studies.  CBC: Recent Labs  Lab 02/06/20 1026 02/06/20 1026 02/08/20 0435 02/08/20 0541 02/09/20 0307 02/10/20 0327 02/10/20 0924 02/11/20 0246 02/12/20 1407  WBC 9.6   < > 8.2  --  6.3 5.9  --  7.3 10.4  NEUTROABS 8.5*  --  6.6  --   --  5.0  --  6.5 9.0*  HGB 8.2*   < > 8.9*   < > 8.1* 7.2* 7.3* 7.3* 7.7*  HCT 28.8*   < > 31.6*   < > 27.7* 24.5* 25.4* 25.6* 27.0*  MCV 98.3   < > 99.1  --  97.5 95.3  --  96.6 99.3  PLT 211   < > 197  --  145* 131*  --  153 182   < > = values in this interval not displayed.   Basic Metabolic Panel: Recent Labs  Lab 02/06/20 1026 02/06/20 1026 02/08/20  4008 02/08/20 0435 02/08/20 0541 02/09/20 0307 02/10/20 0327 02/11/20 0246 02/12/20 0605  NA 140   < > 135   < > 134* 136 133* 134* 138  K 4.0   < > 4.0   < > 4.1 4.5 4.2 4.2 3.9  CL 107   < > 101   < > 101 104 103 103 106  CO2 22   < > 22  --   --  22 20* 21* 22  GLUCOSE 185*   < > 102*   < > 91 112* 143* 159* 127*  BUN 20   < > 14   < > 15 18 26* 28* 25*  CREATININE 0.88   < > 0.72   < > 0.60 0.81 0.98 1.05* 0.96  CALCIUM 9.3   < > 9.4  --   --  9.0 8.9 9.0 9.1  MG 1.4*  --   --   --   --   --  1.6* 1.5* 1.8  PHOS 1.9*  --   --   --   --   --   --   --  2.0*   < > = values in this interval not displayed.   GFR: Estimated Creatinine Clearance: 59.8 mL/min (by C-G formula based on SCr of 0.96 mg/dL). Liver Function Tests: Recent Labs  Lab 02/06/20 1026 02/08/20 0435 02/10/20 0327 02/11/20 0246 02/12/20 0605  AST 16 15 13* 21  --   ALT 9 10 8 11   --   ALKPHOS 118 117 83 98  --   BILITOT 1.1 1.6* 1.1 0.8  --   PROT 6.7 6.8 5.7* 5.9*  --   ALBUMIN 2.6* 2.7* 2.1* 2.2* 2.2*   No results for input(s): LIPASE, AMYLASE in the last 168 hours. No results for input(s): AMMONIA in the last 168 hours. Coagulation Profile: Recent Labs  Lab 02/08/20 0435  INR 1.2   Cardiac Enzymes: No results for input(s): CKTOTAL, CKMB, CKMBINDEX, TROPONINI in the last 168 hours. BNP (last 3  results) No results for input(s): PROBNP in the last 8760 hours. HbA1C: No results for input(s): HGBA1C in the last 72 hours. CBG: No results for input(s): GLUCAP in the last 168 hours. Lipid Profile: No results for input(s): CHOL, HDL, LDLCALC, TRIG, CHOLHDL, LDLDIRECT in the last 72 hours. Thyroid Function Tests: No results for input(s): TSH, T4TOTAL, FREET4, T3FREE, THYROIDAB in the last 72 hours. Anemia Panel: Recent Labs    02/10/20 0924  FERRITIN 236  TIBC 197*  IRON 42   Sepsis Labs: Recent Labs  Lab 02/08/20 0502 02/08/20 0913  PROCALCITON  --  0.34  LATICACIDVEN 0.9  --     Recent Results (from the past 240 hour(s))  Blood Culture (routine x 2)     Status: Abnormal   Collection Time: 02/08/20  4:25 AM   Specimen: BLOOD RIGHT HAND  Result Value Ref Range Status   Specimen Description BLOOD RIGHT HAND  Final   Special Requests   Final    BOTTLES DRAWN AEROBIC AND ANAEROBIC Blood Culture adequate volume   Culture  Setup Time   Final    ANAEROBIC BOTTLE ONLY GRAM POSITIVE COCCI IN CLUSTERS CRITICAL RESULT CALLED TO, READ BACK BY AND VERIFIED WITH: T MEYER PHARMD 02/09/20 2007 JDW    Culture (A)  Final    STAPHYLOCOCCUS SPECIES (COAGULASE NEGATIVE) THE SIGNIFICANCE OF ISOLATING THIS ORGANISM FROM A SINGLE SET OF BLOOD CULTURES WHEN MULTIPLE SETS ARE DRAWN IS UNCERTAIN. PLEASE NOTIFY  THE MICROBIOLOGY DEPARTMENT WITHIN ONE WEEK IF SPECIATION AND SENSITIVITIES ARE REQUIRED. Performed at [facility], [place] [address], [place], [place] [address]    Report Status 02/11/2020 FINAL  Final  Blood Culture (routine x 2)     Status: None (Preliminary result)   Collection Time: 02/08/20  4:40 AM   Specimen: BLOOD RIGHT ARM  Result Value Ref Range Status   Specimen Description BLOOD RIGHT ARM  Final   Special Requests   Final    BOTTLES DRAWN AEROBIC AND ANAEROBIC Blood Culture adequate volume   Culture   Final    NO GROWTH 4 DAYS Performed at [facility],  [place] [address], [place], [place] [address]    Report Status PENDING  Incomplete  SARS CORONAVIRUS 2 (TAT 6-24 HRS) Nasopharyngeal Nasopharyngeal Swab     Status: None   Collection Time: 02/08/20  6:50 AM   Specimen: Nasopharyngeal Swab  Result Value Ref Range Status   SARS Coronavirus 2 NEGATIVE NEGATIVE Final    Comment: (NOTE) SARS-CoV-2 target nucleic acids are NOT DETECTED. The SARS-CoV-2 RNA is generally detectable in upper and lower respiratory specimens during the acute phase of infection. Negative results do not preclude SARS-CoV-2 infection, do not rule out co-infections with other pathogens, and should not be used as the sole basis for treatment or other patient management decisions. Negative results must be combined with clinical observations, patient history, and epidemiological information. The expected result is Negative. Fact Sheet for Patients: SugarRoll.be Fact Sheet for Healthcare Providers: https://www.woods-mathews.com/ This test is not yet approved or cleared by the [place] FDA and  has been authorized for detection and/or diagnosis of SARS-CoV-2 by FDA under an Emergency Use Authorization (EUA). This EUA will remain  in effect (meaning this test can be used) for the duration of the COVID-19 declaration under Section 56 4(b)(1) of the Act, 21 U.S.C. section 360bbb-3(b)(1), unless the authorization is terminated or revoked sooner. Performed at [facility], [place] [address] [place], [place] [address]       Radiology Studies: DG CHEST PORT 1 VIEW  Result Date: 02/11/2020 CLINICAL DATA:  Sepsis.  Shortness of breath. EXAM: PORTABLE CHEST 1 VIEW COMPARISON:  February 08, 2020 FINDINGS: Stable cardiomegaly. No pneumothorax. No nodules or masses. No focal infiltrates. IMPRESSION: No active disease. Electronically Signed   By: Dorise Bullion III M.D   On: 02/11/2020 10:27     Scheduled Meds: . allopurinol  300 mg  Oral Daily  . aspirin  81 mg Oral Daily  . brimonidine  1 drop Left Eye BID  . clonazepam  0.25 mg Oral BID  . cycloSPORINE  1 drop Both Eyes BID  . dorzolamide  1 drop Both Eyes Daily  . feeding supplement (ENSURE ENLIVE)  237 mL Oral BID BM  . FLUoxetine  40 mg Oral Daily  . gabapentin  200 mg Oral TID  . guaiFENesin  600 mg Oral BID  . hydrocortisone sod succinate (SOLU-CORTEF) inj  50 mg Intravenous Daily  . latanoprost  1 drop Both Eyes QHS  . methocarbamol  500 mg Oral QID  . mometasone-formoterol  2 puff Inhalation BID  . multivitamin with minerals  1 tablet Oral Daily  . mycophenolate  180 mg Oral BID  . pantoprazole  40 mg Oral BID  . phosphorus  500 mg Oral TID  . saccharomyces boulardii  250 mg Oral BID  . sodium chloride flush  3 mL Intravenous Q12H  . tacrolimus  0.5 mg Oral QHS  . tacrolimus  1 mg Oral Daily  . verapamil  180 mg Oral Daily   Continuous Infusions: . lactated ringers 100 mL/hr at 02/11/20 2232     LOS: 4 days    Time spent: 25 minutes spent in the coordination of care today.    Jonnie Finner, DO [facility]  If 7PM-7AM, please contact night-coverage [email or web address] 02/12/2020, 3:39 PM

## 2020-02-12 NOTE — Progress Notes (Signed)
Occupational Therapy Treatment Patient Details Name: SCHYLAR ALLARD MRN: [id number] DOB: 12-17-1955 Today's Date: 02/12/2020    History of present illness Pt is a 64 y.o. female with PMH significant of PCKD s/p renal transplant; HLD; anal condyloma; CHF presenting with SOB/nausea.  She was previously hospitalized from 3/10-16 for sepsis of uncertain etiology in a transplant recipient - possibly from mucopurulent bronchitis.  She had acute respiratory failure with hypoxia. She was recommended to go to CIR but declined and went home with [facility].  Pt fell in January with subsequent L hip and wrist fxs. Pt started experiencing SOB at home and a fever.   OT comments  Pt responds well to encouragement. Able to perform stand-pivot transfer with +2 assist followed by 2 more standing trials from chair. Completed AROM L forearm, wrist and digits. Tolerated retrograde and scar massage L UE. Updated d/c recommendation to CIR as pt needs to be more mobile and independent as her support is her elderly mother. Will continue to follow.  Follow Up Recommendations  CIR    Equipment Recommendations  None recommended by OT    Recommendations for Other Services      Precautions / Restrictions Precautions Precautions: Fall Precaution Comments: L wrist brace for comfort, exercise as tolerated Restrictions Weight Bearing Restrictions: No LUE Weight Bearing: Weight bearing as tolerated LLE Weight Bearing: Weight bearing as tolerated       Mobility Bed Mobility Overal bed mobility: Needs Assistance Bed Mobility: Supine to Sit     Supine to sit: Mod assist;HOB elevated     General bed mobility comments: max encouragement to push up with R UE, increased time, pt used R LE to assist L LE off EOB, assist for trunk elevation  Transfers Overall transfer level: Needs assistance Equipment used: Rolling walker (2 wheeled) Transfers: Sit to/from [employer] Sit to Stand: Mod assist;+2 physical  assistance;Max assist Stand pivot transfers: Max assist;+2 physical assistance       General transfer comment: maxAx2 for initial stand, modAx2 for second stand from chair. Pt with noted L UE and LE weakness requriing assist at buttock to achieve full upright standing. Pt with noted L Knee buckling requiring blocking, pt requiring minA for L LE advancement during std pvt    Balance Overall balance assessment: Needs assistance Sitting-balance support: Feet supported;No upper extremity supported Sitting balance-Leahy Scale: Fair Sitting balance - Comments: pt able to maintain EOB balance withou physical assist   Standing balance support: During functional activity Standing balance-Leahy Scale: Poor Standing balance comment: dependent on RW and physical assist                           ADL either performed or assessed with clinical judgement   ADL                                               Vision       Perception     Praxis      Cognition Arousal/Alertness: Awake/alert Behavior During Therapy: WFL for tasks assessed/performed Overall Cognitive Status: Within Functional Limits for tasks assessed                                 General Comments: pt can be self limiting but  responds well to encouragement and humor,        Exercises Other Exercises Other Exercises: worked on weight shifting to L in standing and then marching in place   Shoulder Instructions       General Comments pt with noted SOB however SpO2 >97% on 3lO2 via     Pertinent Vitals/ Pain       Pain Assessment: Faces Faces Pain Scale: Hurts a little bit Pain Location: L wrist Pain Descriptors / Indicators: Discomfort;Sore Pain Intervention(s): Monitored during session;Repositioned  Home Living                                          Prior Functioning/Environment              Frequency  Min 2X/week        Progress  Toward Goals  OT Goals(current goals can now be found in the care plan section)  Progress towards OT goals: Progressing toward goals  Acute Rehab OT Goals Patient Stated Goal: to get stronger and go home OT Goal Formulation: With patient Time For Goal Achievement: 02/19/20 Potential to Achieve Goals: Good  Plan Discharge plan remains appropriate    Co-evaluation    PT/OT/SLP Co-Evaluation/Treatment: Yes Reason for Co-Treatment: For patient/therapist safety PT goals addressed during session: Mobility/safety with mobility OT goals addressed during session: Strengthening/ROM      AM-PAC OT "6 Clicks" Daily Activity     Outcome Measure   Help from another person eating meals?: A Little Help from another person taking care of personal grooming?: A Little Help from another person toileting, which includes using toliet, bedpan, or urinal?: A Lot Help from another person bathing (including washing, rinsing, drying)?: A Lot Help from another person to put on and taking off regular upper body clothing?: A Little Help from another person to put on and taking off regular lower body clothing?: A Lot 6 Click Score: 15    End of Session Equipment Utilized During Treatment: Gait belt;Rolling walker;Oxygen  OT Visit Diagnosis: Other abnormalities of gait and mobility (R26.89);Muscle weakness (generalized) (M62.81);Pain;History of falling (Z91.81) Pain - Right/Left: Left Pain - part of body: Hand   Activity Tolerance Patient tolerated treatment well   Patient Left in chair;with call bell/phone within reach;with chair alarm set   Nurse Communication Mobility status        Time: 0865-7846 OT Time Calculation (min): 35 min  Charges: OT General Charges $OT Visit: 1 Visit OT Treatments $Therapeutic Activity: 8-22 mins  Nestor Lewandowsky, OTR/L Acute Rehabilitation Services Pager: [phone] Office: [phone]  Malka So 02/12/2020, 3:33 PM

## 2020-02-12 NOTE — TOC Progression Note (Signed)
Transition of Care [facility]) - Progression Note    Patient Details  Name: NEILAH FULWIDER MRN: [id number] Date of Birth: Mar 13, 1956  Transition of Care [facility]) CM/SW [place], RN Phone Number: 02/12/2020, 2:38 PM  Clinical Narrative:    Provided choice for palliative. Patient selected [facility]. Referral called to [name] with [facility].    Expected Discharge Plan: Home/Self Care Barriers to Discharge: Continued Medical Work up  Expected Discharge Plan and Services Expected Discharge Plan: Home/Self Care In-house Referral: NA Discharge Planning Services: CM Consult   Living arrangements for the past 2 months: Single Family Home(2 story home but can maintain on the first level)                           HH Arranged: ([place] active prior to admit and will continue upon discharge) [place] Agency: [facility] (now [facility] prior to admit)         Social Determinants of Health (SDOH) Interventions    Readmission Risk Interventions Readmission Risk Prevention Plan 02/09/2020 04/20/2019  Transportation Screening Complete Complete  PCP or Specialist Appt within 3-5 Days - Not Complete  Not Complete comments - not yet ready for d/c  HRI or [place] - Not Complete  HRI or Home Care Consult comments - no needs identified  Social Work Consult for [place] Planning/Counseling - Not Complete  SW consult not completed comments - no needs identified  Palliative Care Screening - Not Applicable  Medication Review (RN Care Manager) Referral to Pharmacy Complete  PCP or Specialist appointment within 3-5 days of discharge Complete -  [place] or Home Care Consult Complete -  [place] Patient Refused -  Some recent data might be hidden

## 2020-02-12 NOTE — Progress Notes (Signed)
Rehab Admissions Coordinator Note:  Per PT and OT recommendation, this patient was re-screened by Raechel Ache for appropriateness for an Inpatient Acute Rehab Consult.  This patient is known to me from recent admissions. Last admission she had concerns regarding her ability to tolerate CIR program as she had been through our program before and ultimately chose to go straight home last admission. IF the patient feels she can tolerate CIR this time around, please have attending MD place consult order for [place] to further assess. Noted that pt has plans to be followed by outpatient palliative at DC and earlier this admission had expressed a desire to return straight home as well.   Raechel Ache 02/12/2020, 5:51 PM  I can be reached at [phone].

## 2020-02-13 ENCOUNTER — Inpatient Hospital Stay (HOSPITAL_COMMUNITY): Payer: Medicare Other

## 2020-02-13 DIAGNOSIS — R0902 Hypoxemia: Secondary | ICD-10-CM

## 2020-02-13 LAB — CBC WITH DIFFERENTIAL/PLATELET
Abs Immature Granulocytes: 0.05 10*3/uL (ref 0.00–0.07)
Basophils Absolute: 0 10*3/uL (ref 0.0–0.1)
Basophils Relative: 0 %
Eosinophils Absolute: 0 10*3/uL (ref 0.0–0.5)
Eosinophils Relative: 0 %
HCT: 25.7 % — ABNORMAL LOW (ref 36.0–46.0)
Hemoglobin: 7.1 g/dL — ABNORMAL LOW (ref 12.0–15.0)
Immature Granulocytes: 1 %
Lymphocytes Relative: 16 %
Lymphs Abs: 1.1 10*3/uL (ref 0.7–4.0)
MCH: 27.8 pg (ref 26.0–34.0)
MCHC: 27.6 g/dL — ABNORMAL LOW (ref 30.0–36.0)
MCV: 100.8 fL — ABNORMAL HIGH (ref 80.0–100.0)
Monocytes Absolute: 0.4 10*3/uL (ref 0.1–1.0)
Monocytes Relative: 6 %
Neutro Abs: 5.6 10*3/uL (ref 1.7–7.7)
Neutrophils Relative %: 77 %
Platelets: 146 10*3/uL — ABNORMAL LOW (ref 150–400)
RBC: 2.55 MIL/uL — ABNORMAL LOW (ref 3.87–5.11)
RDW: 21.2 % — ABNORMAL HIGH (ref 11.5–15.5)
WBC: 7.2 10*3/uL (ref 4.0–10.5)
nRBC: 0 % (ref 0.0–0.2)

## 2020-02-13 LAB — COMPREHENSIVE METABOLIC PANEL
ALT: 17 U/L (ref 0–44)
AST: 19 U/L (ref 15–41)
Albumin: 2.2 g/dL — ABNORMAL LOW (ref 3.5–5.0)
Alkaline Phosphatase: 103 U/L (ref 38–126)
Anion gap: 8 (ref 5–15)
BUN: 25 mg/dL — ABNORMAL HIGH (ref 8–23)
CO2: 22 mmol/L (ref 22–32)
Calcium: 8.8 mg/dL — ABNORMAL LOW (ref 8.9–10.3)
Chloride: 108 mmol/L (ref 98–111)
Creatinine, Ser: 1.01 mg/dL — ABNORMAL HIGH (ref 0.44–1.00)
GFR calc Af Amer: >60 mL/min (ref >60)
GFR calc non Af Amer: 60 mL/min — ABNORMAL LOW (ref >60)
Glucose, Bld: 118 mg/dL — ABNORMAL HIGH (ref 70–99)
Potassium: 3.7 mmol/L (ref 3.5–5.1)
Sodium: 138 mmol/L (ref 135–145)
Total Bilirubin: 0.9 mg/dL (ref 0.3–1.2)
Total Protein: 5.8 g/dL — ABNORMAL LOW (ref 6.5–8.1)

## 2020-02-13 LAB — PHOSPHORUS: Phosphorus: 2.5 mg/dL (ref 2.5–4.6)

## 2020-02-13 LAB — CULTURE, BLOOD (ROUTINE X 2)
Culture: NO GROWTH
Special Requests: ADEQUATE

## 2020-02-13 LAB — MAGNESIUM: Magnesium: 1.6 mg/dL — ABNORMAL LOW (ref 1.7–2.4)

## 2020-02-13 LAB — PREPARE RBC (CROSSMATCH)

## 2020-02-13 MED ORDER — LORAZEPAM 2 MG/ML IJ SOLN
1.0000 mg | Freq: Once | INTRAMUSCULAR | Status: AC
  Administered 2020-02-14: 1 mg via INTRAVENOUS
  Filled 2020-02-13: qty 1

## 2020-02-13 MED ORDER — TECHNETIUM TO 99M ALBUMIN AGGREGATED: 1.7000 | Freq: Once | INTRAVENOUS | Status: DC | PRN

## 2020-02-13 MED ORDER — IPRATROPIUM-ALBUTEROL 0.5-2.5 (3) MG/3ML IN SOLN
3.0000 mL | RESPIRATORY_TRACT | Status: DC | PRN
  Administered 2020-02-13: 3 mL via RESPIRATORY_TRACT
  Filled 2020-02-13: qty 3

## 2020-02-13 MED ORDER — PREDNISONE 20 MG PO TABS
40.0000 mg | ORAL_TABLET | Freq: Every day | ORAL | Status: DC
  Administered 2020-02-14: 40 mg via ORAL
  Filled 2020-02-13: qty 2

## 2020-02-13 MED ORDER — MAGNESIUM OXIDE 400 (241.3 MG) MG PO TABS
400.0000 mg | ORAL_TABLET | Freq: Two times a day (BID) | ORAL | Status: DC
  Administered 2020-02-13 – 2020-02-23 (×20): 400 mg via ORAL
  Filled 2020-02-13 (×20): qty 1

## 2020-02-13 MED ORDER — PREDNISONE 20 MG PO TABS: 50.0000 mg | ORAL_TABLET | Freq: Every day | ORAL | Status: DC

## 2020-02-13 MED ORDER — SODIUM CHLORIDE 0.9% IV SOLUTION: Freq: Once | INTRAVENOUS | Status: AC

## 2020-02-13 NOTE — Progress Notes (Signed)
Attempted to visit patient. She was not in her room. Chaplain will follow up at a later time.    Palliative care  Chaplain Resident Fidel Levy [phone]

## 2020-02-13 NOTE — Progress Notes (Signed)
[name]  PROGRESS NOTE    KHARI LETT  VQQ:[id number] DOB: [birth year] DOA: 02/08/2020 PCP: [name], MD   Brief Narrative:   [name] a 64 y.o.femalewith medical history significant ofPCKD s/p renal transplant; HLD; anal condyloma; and CHF presenting with SOB/nausea. She was previously hospitalized from 3/10-16 for sepsis of uncertain etiology in a transplant recipient - possibly from mucopurulent bronchitis. She had acute respiratory failure with hypoxia. ID was involved and she was treated with broad spectrum antibiotics and transitioned to IV ceftriaxone. She was recommended to go to CIR but declined and went home with home health services. She felt good when she went home - except that she was having horrible burning diarrhea "from the antibiotics." That got worse and worse and she couldn't tolerate it anymore. She didn't have as much help at home to help her clean up. She has been coughing and dry heaving. Her last BM was yesterday. Overnight, she started having a hard time breathing and couldn't quit dry heaving. She had recurrent fever at home up to 100.4. She is no longer still having cough. No urinary symptoms.   She fell in January - hip and pelvic fractures as well as wrist fracture. She went to CIR but was in too much pain to be successful. She is now bed bound. Every time she starts to get more mobile, she has a setback or illness. They recommended return to CIR after last hospitalization. She declined because she didn't have the strength to keep up with the program. She refused a SNF rehab due to her immunosuppression. She continues to feel the same about disposition.  02/13/20: Intention was for discharge today. She felt well enough in the AM, but as the afternoon progressed; she felt more short of breath. Will ^MWT and document O2 needs for discharge. V/Q scan is negative. We will also get her a unit of blood (? Symptomatic anemia). Reassess for  d/c in AM.   Assessment & Plan:   Principal Problem:   Sepsis ([place]) Active Problems:   Renal transplant recipient   Chronic diastolic CHF (congestive heart failure) (HCC)   Physical deconditioning   Essential hypertension   Glaucoma   SVT (supraventricular tachycardia) (HCC)   Anemia of chronic disease   Palliative care encounter   Goals of care, counseling/discussion  Sepsis in an immunocompromised patient, uncertain etiology - Patient with recent hospitalization, discharged on 3/16 after sepsis from uncertain source - She returned with fever, tachycardia, tachypnea concerning for recurrent sepsis - She did develop severe diarrhea with Augmentin but this appears to have abated - continue stress dose steroids for now; abx have been stopped, she is afebrile ON - Bld Cx NTD - ID onboard, appreciate assistance - 3/20: No fevers ON. 1/4 vials with growth but c/w contamination - decrease steroids - 3/21: continue to wean steroids - 3/23: taper steroids; 40mg  prednisone in AM  PCKD s/p renal transplantation - continue immunosuppressants including mycophenolate and tacrolimus - continue stress dose steroids; will need to taper - 3/20: decrease steroids - 3/23: continue to wean steroids  HTN - continue metoprololwith hold parameters - 3/21: d/c metoprolol and change to verapamil  Chronic diastolic CHF - continue metoprolol - euvolemic - see above  Glaucoma Dry eye - continue home drops  Debility - Since her fall in January, she has been essentially bed bound - She is quite deconditioned and this is unlikely to improve her long-term outcomes - PT/OT: HHPT/OT - Pt is encouraged to get  OOB to chair at least q shift - palliative care consultation - 3/22: PT/OT re-eval'd and rec'd CIR. Will speak with CM about this.     - 3/23: pt does not want CIR; wants to go home  with HHPT/OT  Asthma - continue dulera, mucinex     - 3/23: lung exam is clear and she's moving good air  Gout - allopurinol  Chronic spasm/pain - robaxin, gabapentin  Anxiety/depression - prozac  GERD - protonix  Macrocytic anemia Thrombocytopenia - check iron studies - hold lovenox - no evidence of bleed - check hemoccult - 3/23: Hgb is 7.1; question symptomatic anemia, transfuse 1 unit pRBCs  SVT - HR up to 190s and unresponsive to metoprolol - given 2 rounds of adenosine; converted, started on verapamil - HR and BP is stable now - 3/22: she is in sinus and tolerating verapamil, continue     - 3/23: she is stable, continue  Dyspnea Symptomatic anemia? - CXR is ok - d-dimer is up; check VQ scan - anxiety may be playing a role; add some klonopin     - 3/23: VQ negative; add 6MWT, add 1 unit pRBCs  DVT prophylaxis: SCDs Code Status: FULL Family Communication: Spoke with dtr [name] [phone]) by phone and updated with plan.   Disposition Plan: Get her some blood. 6MWT. May need to go home with oxygen. Not back at baseline O2 status.   Consultants:   ID  PC  ROS:  Reports dyspnea . Remainder 10-pt ROS is negative for all not previously mentioned.  Subjective: "I'm still not normal."  Objective: Vitals:   02/12/20 2058 02/13/20 0830 02/13/20 0835 02/13/20 1139  BP: (!) 104/50 (!) 121/59    Pulse:  85 86 86  Resp:  20 20 20   Temp: 98.9 F (37.2 C) 98.5 F (36.9 C)    TempSrc: Oral     SpO2: 100% 99% 98% 98%  Weight:      Height:        Intake/Output Summary (Last 24 hours) at 02/13/2020 1614 Last data filed at 02/13/2020 0930 Gross per 24 hour  Intake --  Output 1200 ml  Net -1200 ml   Filed Weights   02/08/20 0728 02/10/20 2229  Weight: 75.5 kg 80.6 kg    Examination:  General: 64 y.o. female resting in bed in NAD Cardiovascular: RRR, +S1, S2, no  m/g/r, equal pulses throughout Respiratory: CTABL, no w/r/r, normal WOB GI: BS+, NDNT, soft MSK: No e/c/c Neuro: Alert to name, follows commands, somewhat jittery this afternoon Psyc: Appropriate interaction and affect, calm/cooperative   Data Reviewed: I have personally reviewed following labs and imaging studies.  CBC: Recent Labs  Lab 02/08/20 0435 02/08/20 0541 02/09/20 0307 02/09/20 0307 02/10/20 0327 02/10/20 0924 02/11/20 0246 02/12/20 1407 02/13/20 0337  WBC 8.2   < > 6.3  --  5.9  --  7.3 10.4 7.2  NEUTROABS 6.6  --   --   --  5.0  --  6.5 9.0* 5.6  HGB 8.9*   < > 8.1*   < > 7.2* 7.3* 7.3* 7.7* 7.1*  HCT 31.6*   < > 27.7*   < > 24.5* 25.4* 25.6* 27.0* 25.7*  MCV 99.1   < > 97.5  --  95.3  --  96.6 99.3 100.8*  PLT 197   < > 145*  --  131*  --  153 182 146*   < > = values in this interval not displayed.   Basic  Metabolic Panel: Recent Labs  Lab 02/09/20 0307 02/10/20 0327 02/11/20 0246 02/12/20 0605 02/13/20 0337  NA 136 133* 134* 138 138  K 4.5 4.2 4.2 3.9 3.7  CL 104 103 103 106 108  CO2 22 20* 21* 22 22  GLUCOSE 112* 143* 159* 127* 118*  BUN 18 26* 28* 25* 25*  CREATININE 0.81 0.98 1.05* 0.96 1.01*  CALCIUM 9.0 8.9 9.0 9.1 8.8*  MG  --  1.6* 1.5* 1.8 1.6*  PHOS  --   --   --  2.0* 2.5   GFR: Estimated Creatinine Clearance: 56.8 mL/min (A) (by C-G formula based on SCr of 1.01 mg/dL (H)). Liver Function Tests: Recent Labs  Lab 02/08/20 0435 02/10/20 0327 02/11/20 0246 02/12/20 0605 02/13/20 0337  AST 15 13* 21  --  19  ALT 10 8 11   --  17  ALKPHOS 117 83 98  --  103  BILITOT 1.6* 1.1 0.8  --  0.9  PROT 6.8 5.7* 5.9*  --  5.8*  ALBUMIN 2.7* 2.1* 2.2* 2.2* 2.2*   No results for input(s): LIPASE, AMYLASE in the last 168 hours. No results for input(s): AMMONIA in the last 168 hours. Coagulation Profile: Recent Labs  Lab 02/08/20 0435  INR 1.2   Cardiac Enzymes: No results for input(s): CKTOTAL, CKMB, CKMBINDEX, TROPONINI in the last 168  hours. BNP (last 3 results) No results for input(s): PROBNP in the last 8760 hours. HbA1C: No results for input(s): HGBA1C in the last 72 hours. CBG: No results for input(s): GLUCAP in the last 168 hours. Lipid Profile: No results for input(s): CHOL, HDL, LDLCALC, TRIG, CHOLHDL, LDLDIRECT in the last 72 hours. Thyroid Function Tests: No results for input(s): TSH, T4TOTAL, FREET4, T3FREE, THYROIDAB in the last 72 hours. Anemia Panel: No results for input(s): VITAMINB12, FOLATE, FERRITIN, TIBC, IRON, RETICCTPCT in the last 72 hours. Sepsis Labs: Recent Labs  Lab 02/08/20 0502 02/08/20 0913  PROCALCITON  --  0.34  LATICACIDVEN 0.9  --     Recent Results (from the past 240 hour(s))  Blood Culture (routine x 2)     Status: Abnormal   Collection Time: 02/08/20  4:25 AM   Specimen: BLOOD RIGHT HAND  Result Value Ref Range Status   Specimen Description BLOOD RIGHT HAND  Final   Special Requests   Final    BOTTLES DRAWN AEROBIC AND ANAEROBIC Blood Culture adequate volume   Culture  Setup Time   Final    ANAEROBIC BOTTLE ONLY GRAM POSITIVE COCCI IN CLUSTERS CRITICAL RESULT CALLED TO, READ BACK BY AND VERIFIED WITH: T MEYER PHARMD 02/09/20 2007 JDW    Culture (A)  Final    STAPHYLOCOCCUS SPECIES (COAGULASE NEGATIVE) THE SIGNIFICANCE OF ISOLATING THIS ORGANISM FROM A SINGLE SET OF BLOOD CULTURES WHEN MULTIPLE SETS ARE DRAWN IS UNCERTAIN. PLEASE NOTIFY THE MICROBIOLOGY DEPARTMENT WITHIN ONE WEEK IF SPECIATION AND SENSITIVITIES ARE REQUIRED. Performed at [facility], [place] [address] [place], [place] [address]    Report Status 02/11/2020 FINAL  Final  Blood Culture (routine x 2)     Status: None   Collection Time: 02/08/20  4:40 AM   Specimen: BLOOD RIGHT ARM  Result Value Ref Range Status   Specimen Description BLOOD RIGHT ARM  Final   Special Requests   Final    BOTTLES DRAWN AEROBIC AND ANAEROBIC Blood Culture adequate volume Performed at [facility], [place] [address] [place], [place] [address]    Culture NO GROWTH 5  DAYS  Final   Report Status 02/13/2020 FINAL  Final  SARS CORONAVIRUS 2 (TAT 6-24 HRS) Nasopharyngeal Nasopharyngeal Swab     Status: None   Collection Time: 02/08/20  6:50 AM   Specimen: Nasopharyngeal Swab  Result Value Ref Range Status   SARS Coronavirus 2 NEGATIVE NEGATIVE Final    Comment: (NOTE) SARS-CoV-2 target nucleic acids are NOT DETECTED. The SARS-CoV-2 RNA is generally detectable in upper and lower respiratory specimens during the acute phase of infection. Negative results do not preclude SARS-CoV-2 infection, do not rule out co-infections with other pathogens, and should not be used as the sole basis for treatment or other patient management decisions. Negative results must be combined with clinical observations, patient history, and epidemiological information. The expected result is Negative. Fact Sheet for Patients: SugarRoll.be Fact Sheet for Healthcare Providers: https://www.woods-mathews.com/ This test is not yet approved or cleared by the [place] FDA and  has been authorized for detection and/or diagnosis of SARS-CoV-2 by FDA under an Emergency Use Authorization (EUA). This EUA will remain  in effect (meaning this test can be used) for the duration of the COVID-19 declaration under Section 56 4(b)(1) of the Act, 21 U.S.C. section 360bbb-3(b)(1), unless the authorization is terminated or revoked sooner. Performed at [facility], [place] [address], [place], [place] [address]       Radiology Studies: NM Pulmonary Perfusion  Result Date: 02/13/2020 CLINICAL DATA:  PE suspected.  Positive D-dimer. EXAM: NUCLEAR MEDICINE PERFUSION LUNG SCAN TECHNIQUE: Perfusion images were obtained in multiple projections after intravenous injection of radiopharmaceutical. Ventilation scans intentionally deferred if perfusion scan and chest x-ray adequate for interpretation during  COVID 19 epidemic. RADIOPHARMACEUTICALS:  1.7 mCi Tc-66m MAA IV COMPARISON:  Chest radiograph 04/19/2019 FINDINGS: No wedge-shaped peripheral perfusion defects. No segmental defects. No evidence acute pulmonary embolism. IMPRESSION: No evidence of acute pulmonary embolism. Electronically Signed   By: Suzy Bouchard M.D.   On: 02/13/2020 11:39   DG CHEST PORT 1 VIEW  Result Date: 02/12/2020 CLINICAL DATA:  Polycystic kidney disease status post renal transplant, short of breath, nausea, cough EXAM: PORTABLE CHEST 1 VIEW COMPARISON:  02/11/2020 FINDINGS: Single frontal view of the chest demonstrates a stable cardiac silhouette. Small right pleural effusion has developed in the interim. No airspace disease or pneumothorax. Mild central vascular congestion unchanged. No acute bony abnormalities. IMPRESSION: 1. Chronic central vascular congestion. 2. Trace right pleural effusion. Electronically Signed   By: Randa Ngo M.D.   On: 02/12/2020 18:12     Scheduled Meds: . sodium chloride   Intravenous Once  . allopurinol  300 mg Oral Daily  . aspirin  81 mg Oral Daily  . brimonidine  1 drop Left Eye BID  . clonazepam  0.25 mg Oral BID  . cycloSPORINE  1 drop Both Eyes BID  . dorzolamide  1 drop Both Eyes Daily  . feeding supplement (ENSURE ENLIVE)  237 mL Oral BID BM  . FLUoxetine  40 mg Oral Daily  . gabapentin  200 mg Oral TID  . guaiFENesin  600 mg Oral BID  . hydrocortisone sod succinate (SOLU-CORTEF) inj  50 mg Intravenous Daily  . latanoprost  1 drop Both Eyes QHS  . magnesium oxide  400 mg Oral BID  . methocarbamol  500 mg Oral QID  . mometasone-formoterol  2 puff Inhalation BID  . multivitamin with minerals  1 tablet Oral Daily  . mycophenolate  180 mg Oral BID  . pantoprazole  40 mg Oral  BID  . phosphorus  500 mg Oral TID  . saccharomyces boulardii  250 mg Oral BID  . sodium chloride flush  3 mL Intravenous Q12H  . tacrolimus  0.5 mg Oral QHS  . tacrolimus  1 mg Oral Daily  .  verapamil  180 mg Oral Daily   Continuous Infusions: . lactated ringers 100 mL/hr at 02/13/20 0556     LOS: 5 days    Time spent: 25 minutes spent in the coordination of care today.    Jonnie Finner, DO [facility]  If 7PM-7AM, please contact night-coverage [email or web address] 02/13/2020, 4:14 PM

## 2020-02-13 NOTE — Progress Notes (Signed)
Patient reports respiratory distress. States it feels like her diaphragm is closing. Respiratory therapy aware and gave breathing treatment. Oxygen 100% on 2 liters via [place]. Patient requesting prednisone, states she takes that at home when she's having inflammation related to asthma. Awaiting call back or new orders from MD.

## 2020-02-13 NOTE — Progress Notes (Signed)
I spoke with NP Sharlet Salina two additional times since last note. New orders given for Ativan, patient refusing stating its not anxiety. Patient continues to request steroid. Verified with pharmacist that hydrocortisone 50mg  was adminsitered today at 0911. Scheduled to receive prednisone 40 mg tomorrow morning.  Patient requesting to see NP in person. NP states she will attempt to come see patient during the night. New order given for duoneb nebulizer treatment. Respiratory therapist in room and administering treatment. Per respiratory therapy, patient stopped treatment prior to completion. Oxygen saturations 100% on 2 liters via [place]. Continue ongoing monitoring and plan of care.

## 2020-02-13 NOTE — TOC Progression Note (Signed)
Transition of Care [facility]) - Progression Note    Patient Details  Name: RYLIE LIMBURG MRN: [id number] Date of Birth: [birth year]  Transition of Care [facility]) CM/SW [place], RN Phone Number: 02/13/2020, 4:13 PM  Clinical Narrative:  Home palliative care has been set up through [place]. Patient is agreeable to receive services. Will continue to follow      Expected Discharge Plan: Home/Self Care Barriers to Discharge: Continued Medical Work up  Expected Discharge Plan and Services Expected Discharge Plan: Home/Self Care In-house Referral: NA Discharge Planning Services: CM Consult   Living arrangements for the past 2 months: Single Family Home(2 story home but can maintain on the first level)                           HH Arranged: ([place] active prior to admit and will continue upon discharge) [place] Agency: [facility] prior to admit)         Social Determinants of Health (SDOH) Interventions    Readmission Risk Interventions Readmission Risk Prevention Plan 02/09/2020 04/20/2019  Transportation Screening Complete Complete  PCP or Specialist Appt within 3-5 Days - Not Complete  Not Complete comments - not yet ready for d/c  HRI or [place] - Not Complete  HRI or Home Care Consult comments - no needs identified  Social Work Consult for [place] Planning/Counseling - Not Complete  SW consult not completed comments - no needs identified  Palliative Care Screening - Not Applicable  Medication Review (RN Care Manager) Referral to Pharmacy Complete  PCP or Specialist appointment within 3-5 days of discharge Complete -  [place] or Home Care Consult Complete -  [place] Patient Refused -  Some recent data might be hidden

## 2020-02-13 NOTE — Progress Notes (Signed)
PALLIATIVE NOTE:  Patient resting. Easily aroused with verbal stimuli. She denies pain or shortness of breath. She reports she feels as though she is getting somewhat stronger, referencing her PT session on yesterday.   She is extremely appreciative of the medical team and care. Mrs. Settle reports she is ready to get back home to her family but also knows it is important to receive the appropriate care she needs with hopes of getting somewhat better.   We discussed her current illness and she expressed understanding of her conditions. She shares plans for home with rehab versus an inpatient rehab with a goal of improving. She states her daughter also works as an Secretary/administrator and providing her some encouragement. She has her Advance Directive packet at the bedside, with plans to complete with Chaplain naming her daughter as her HCPOA.   Goals remains clear as expressed by patient, to remain a full code and continue with full scope care. Mrs. Coppess remains hopeful for some improvement/stability. She shares she has a lot of support from her family and also church and community members.   Plan -Full Code/Full Scope -Continue with current plan of care per medical team -Patient remains hopeful for improvement and to return home eventually with her husband, mother, and son's support.  -Outpatient Palliative -PMT will continue to support and follow.   Time Total: 40 min.   Visit consisted of counseling and education dealing with the complex and emotionally intense issues of symptom management and palliative care in the setting of serious and potentially life-threatening illness.Greater than 50%  of this time was spent counseling and coordinating care related to the above assessment and plan.  Alda Lea, AGPCNP-BC  Palliative Medicine Team [phone]

## 2020-02-13 NOTE — Progress Notes (Addendum)
Physical Therapy Treatment Patient Details Name: April [name] MRN: [id number] DOB: Nov 15, 1956 Today's Date: 02/13/2020    History of Present Illness Pt is a 64 y.o. female with PMH significant of PCKD s/p renal transplant; HLD; anal condyloma; CHF presenting with SOB/nausea.  She was previously hospitalized from 3/10-16 for sepsis of uncertain etiology in a transplant recipient - possibly from mucopurulent bronchitis.  She had acute respiratory failure with hypoxia. She was recommended to go to CIR but declined and went home with [facility].  Pt fell in January with subsequent L hip and wrist fxs. Pt started experiencing SOB at home and a fever.    PT Comments    Pt refuses getting OOB into recliner stating "I'm going home in an hour." Pt willing to get EOB and do exercises. Resting SpO2 on room air is 93%. Pt requires mod A to transfer supine>sit and requires frequent cuing for breathing as pt tends to hold her breath upon exertion. Pt has difficulty pulling herself upright EOB. Upon EOB, pt becomes increasingly SOB and desats to 85%. Pt is placed on 2L of supp O2 via [place] in which SpO2 slowly increases into the 90s. Pt tolerates sitting EOB for approximately 12 minutes however is very fatigued and winded. Pt educated on pursed lip breathing and "belly" (diaphragmatic) breathing however continues to take short, shallow breaths using accessory inspiratory musculature. D/C plans remain appropriate if pt continues to progress, however otherwise SNF would be more appropriate. PT will continue to follow pt acutely.    Follow Up Recommendations  CIR     Equipment Recommendations  None recommended by PT       Precautions / Restrictions Precautions Precautions: Fall Precaution Comments: L wrist brace for comfort, exercise as tolerated Restrictions Weight Bearing Restrictions: No LUE Weight Bearing: Weight bearing as tolerated LLE Weight Bearing: Weight bearing as tolerated    Mobility  Bed  Mobility Overal bed mobility: Needs Assistance Bed Mobility: Supine to Sit;Sit to Supine     Supine to sit: Mod assist;HOB elevated Sit to supine: Mod assist   General bed mobility comments: Difficulty initiating transfer, mod A for upright trunk and slight LE assistance  Transfers                 General transfer comment: unwilling to transfer to recliner, pt desats EOB today  Ambulation/Gait             General Gait Details: unable                 Balance Overall balance assessment: Needs assistance Sitting-balance support: Feet supported;Single extremity supported Sitting balance-Leahy Scale: Poor Sitting balance - Comments: Pt has increased difficulty sitting EOB today, requiring min guard to maintain upright sitting balance and relies on unilateral UE support       Standing balance comment: unable to stand today, pt SOB/ desats                            Cognition Arousal/Alertness: Awake/alert Behavior During Therapy: WFL for tasks assessed/performed Overall Cognitive Status: Within Functional Limits for tasks assessed                                 General Comments: pt can be self limiting but responds well to encouragement and humor      Exercises Other Exercises Other Exercises: pursed lip breathing Other Exercises: "  belly" breathing (diaphragmatic breathing); pt has difficulty Other Exercises: sitting EOB ~12 minutes    General Comments General comments (skin integrity, edema, etc.): Pt begins therapy without supp O2. Resting SpO2 is 93%. However, upon sitting EOB SpO2 drops to 85% and pt is SOB and wheezing. 2L of O2 provided to pt in which SpO2 slowly rose into the 90s. Pt left on 2L of O2 with nursing present, SpO2 was 98%.       Pertinent Vitals/Pain Pain Assessment: Faces Faces Pain Scale: Hurts little more Pain Location: L wrist, LEs upon sitting EOB for 10 minutes Pain Descriptors / Indicators:  Discomfort;Sore Pain Intervention(s): Limited activity within patient's tolerance;Monitored during session;Repositioned           PT Goals (current goals can now be found in the care plan section) Acute Rehab PT Goals Patient Stated Goal: to get stronger and go home PT Goal Formulation: With patient Time For Goal Achievement: 02/23/20 Potential to Achieve Goals: Fair Progress towards PT goals: Progressing toward goals    Frequency    Min 3X/week      PT Plan Current plan remains appropriate       AM-PAC PT "6 Clicks" Mobility   Outcome Measure  Help needed turning from your back to your side while in a flat bed without using bedrails?: A Little Help needed moving from lying on your back to sitting on the side of a flat bed without using bedrails?: A Lot Help needed moving to and from a bed to a chair (including a wheelchair)?: A Lot Help needed standing up from a chair using your arms (e.g., wheelchair or bedside chair)?: A Lot Help needed to walk in hospital room?: Total Help needed climbing 3-5 steps with a railing? : Total 6 Click Score: 11    End of Session Equipment Utilized During Treatment: Oxygen Activity Tolerance: Patient limited by fatigue;Patient limited by lethargy;Other (comment)(limited due to desats) Patient left: in bed;with call bell/phone within reach;with bed alarm set;with nursing/sitter in room Nurse Communication: Mobility status PT Visit Diagnosis: Muscle weakness (generalized) (M62.81);History of falling (Z91.81);Difficulty in walking, not elsewhere classified (R26.2)     Time: 1610-9604 PT Time Calculation (min) (ACUTE ONLY): 24 min  Charges:  $Therapeutic Activity: 8-22 mins $Neuromuscular Re-education: 8-22 mins                     Jodelle Green, PT, DPT Acute Rehabilitation Services Office [phone]    Jodelle Green 02/13/2020, 4:28 PM

## 2020-02-14 ENCOUNTER — Inpatient Hospital Stay (HOSPITAL_COMMUNITY): Payer: Medicare Other

## 2020-02-14 ENCOUNTER — Ambulatory Visit: Payer: Self-pay | Admitting: Cardiology

## 2020-02-14 LAB — CBC
HCT: 30 % — ABNORMAL LOW (ref 36.0–46.0)
Hemoglobin: 8.6 g/dL — ABNORMAL LOW (ref 12.0–15.0)
MCH: 27.8 pg (ref 26.0–34.0)
MCHC: 28.7 g/dL — ABNORMAL LOW (ref 30.0–36.0)
MCV: 97.1 fL (ref 80.0–100.0)
Platelets: 208 10*3/uL (ref 150–400)
RBC: 3.09 MIL/uL — ABNORMAL LOW (ref 3.87–5.11)
RDW: 22.6 % — ABNORMAL HIGH (ref 11.5–15.5)
WBC: 25 10*3/uL — ABNORMAL HIGH (ref 4.0–10.5)
nRBC: 0 % (ref 0.0–0.2)

## 2020-02-14 LAB — TYPE AND SCREEN
ABO/RH(D): A NEG
Antibody Screen: NEGATIVE
Unit division: 0

## 2020-02-14 LAB — BLOOD GAS, ARTERIAL
Acid-Base Excess: 0.9 mmol/L (ref 0.0–2.0)
Bicarbonate: 25 mmol/L (ref 20.0–28.0)
Drawn by: 30136
FIO2: 30
O2 Saturation: 99.1 %
Patient temperature: 37.1
pCO2 arterial: 40.9 mmHg (ref 32.0–48.0)
pH, Arterial: 7.404 (ref 7.350–7.450)
pO2, Arterial: 134 mmHg — ABNORMAL HIGH (ref 83.0–108.0)

## 2020-02-14 LAB — BPAM RBC
Blood Product Expiration Date: 202104092359
ISSUE DATE / TIME: 202103231748
Unit Type and Rh: 600

## 2020-02-14 LAB — COMPREHENSIVE METABOLIC PANEL
ALT: 17 U/L (ref 0–44)
AST: 19 U/L (ref 15–41)
Albumin: 2.5 g/dL — ABNORMAL LOW (ref 3.5–5.0)
Alkaline Phosphatase: 121 U/L (ref 38–126)
Anion gap: 11 (ref 5–15)
BUN: 24 mg/dL — ABNORMAL HIGH (ref 8–23)
CO2: 23 mmol/L (ref 22–32)
Calcium: 8.9 mg/dL (ref 8.9–10.3)
Chloride: 104 mmol/L (ref 98–111)
Creatinine, Ser: 0.93 mg/dL (ref 0.44–1.00)
GFR calc Af Amer: >60 mL/min (ref >60)
GFR calc non Af Amer: >60 mL/min (ref >60)
Glucose, Bld: 134 mg/dL — ABNORMAL HIGH (ref 70–99)
Potassium: 4.3 mmol/L (ref 3.5–5.1)
Sodium: 138 mmol/L (ref 135–145)
Total Bilirubin: 1.6 mg/dL — ABNORMAL HIGH (ref 0.3–1.2)
Total Protein: 6.4 g/dL — ABNORMAL LOW (ref 6.5–8.1)

## 2020-02-14 LAB — GLUCOSE, CAPILLARY
Glucose-Capillary: 115 mg/dL — ABNORMAL HIGH (ref 70–99)
Glucose-Capillary: 200 mg/dL — ABNORMAL HIGH (ref 70–99)

## 2020-02-14 MED ORDER — FUROSEMIDE 10 MG/ML IJ SOLN
40.0000 mg | Freq: Two times a day (BID) | INTRAMUSCULAR | Status: DC
  Administered 2020-02-14 – 2020-02-16 (×4): 40 mg via INTRAVENOUS
  Filled 2020-02-14 (×4): qty 4

## 2020-02-14 MED ORDER — CHLORHEXIDINE GLUCONATE CLOTH 2 % EX PADS
6.0000 | MEDICATED_PAD | Freq: Every day | CUTANEOUS | Status: DC
  Administered 2020-02-14 – 2020-02-23 (×10): 6 via TOPICAL

## 2020-02-14 MED ORDER — FUROSEMIDE 10 MG/ML IJ SOLN
80.0000 mg | Freq: Once | INTRAMUSCULAR | Status: AC
  Administered 2020-02-14: 80 mg via INTRAVENOUS
  Filled 2020-02-14: qty 8

## 2020-02-14 MED ORDER — METHYLPREDNISOLONE SODIUM SUCC 40 MG IJ SOLR
40.0000 mg | Freq: Four times a day (QID) | INTRAMUSCULAR | Status: AC
  Administered 2020-02-14 – 2020-02-16 (×8): 40 mg via INTRAVENOUS
  Filled 2020-02-14 (×8): qty 1

## 2020-02-14 NOTE — Plan of Care (Signed)

## 2020-02-14 NOTE — Plan of Care (Signed)
  Problem: Clinical Measurements: Goal: Ability to maintain clinical measurements within normal limits will improve Outcome: Not Progressing Goal: Will remain free from infection Outcome: Not Progressing Note: WBC increased from 7 to 25 Goal: Respiratory complications will improve Outcome: Not Progressing Note: Pt placed on BiPAP and IV lasix started.    Problem: Activity: Goal: Risk for activity intolerance will decrease Outcome: Not Progressing   Problem: Elimination: Goal: Will not experience complications related to urinary retention Outcome: Not Progressing Note: Foley in place

## 2020-02-14 NOTE — Progress Notes (Addendum)
RN in room at shift change getting report from [place] RN. Patient short of breath and tacypneic, HR 110s. Assessment completed, see flow sheet. MEWS Yellow of 3. MD paged. Respiratory paged. Rapid RN called. CXR and labs ordered. Charge RN also at bedside. This RN noticed that magnesium was 1.6. RN asked magnesium needed to be replaced.   0812 MD at bedside.   8295 RT in room. BiPAP initiated. 80mg  IV lasix given. No urine output noted. MD notified.  Vitals with BMI 02/14/2020 02/14/2020 02/13/2020  Height - - -  Weight - - -  BMI - - -  Systolic 621 308 657  Diastolic 83 72 58  Pulse 846 116 81    0930-1100 - MD notified of no urine output since lasix administration. Foley placed, 450 of clear/yellow urine immediately removed. Solumedrol Q6 ordered. Vitals changed from yellow MEWS to green MEWS score. Agricultural consultant at bedside.    [place] and Agricultural consultant at bedside again to assess patient with this Therapist, sports. Pt A&Ox3 and continues to have pitting edema BUE/BLE. Rectal temperature obtained and WNL. Heart rhythm assessed - no acute changes. MD notified. ABG order obtained. RT notified of need to collect ABG. Total urine output at this time is ~938mL since lasix administration. MD also notified of increased WBC (7-->25 in 24 hour period).  Today's Vitals   02/14/20 1300 02/14/20 1308 02/14/20 1318 02/14/20 1330  BP: 120/65  110/65 111/63  Pulse:      Resp: 20  20 (!) 21  Temp:  98.8 F (37.1 C)    TempSrc:  Rectal    SpO2: 99%  99% 99%  Weight:      Height:      PainSc:       Body mass index is 32.5 kg/m.  1405 RT obtained ABG, awaiting results.   1430 ABG resulted back. MD and RRT RN notified. No new orders.      Component Value Date/Time   PHART 7.404 02/14/2020 1408   PCO2ART 40.9 02/14/2020 1408   PO2ART 134 (H) 02/14/2020 1408   HCO3 25.0 02/14/2020 1408   TCO2 26 02/08/2020 0541   ACIDBASEDEF 3.0 (H) 01/31/2020 2229   O2SAT 99.1 02/14/2020 1408    1545 RN noticed BPs  starting to trend downward and currently 90s/40s with a MAP less than 65. MD and RRT RN notified. No new orders. Per MD okay to continue with diuresis if MAP greater than 65. This RN asked if LA or blood cultures were appropriate at this time. RN requested RRT RN to come to bedside to assess patient.   1615 RRT RN at bedside to reassess -- discussed with this RN. RRT RN to reach out to MD.   [phone] RN came in to assess patient again. Pt asked to follow fingers -- patient able to complete task however had nystagmus with attempts. Rapid RN notified. MD notified.  1808 CBG checked -- 200.   .. Vitals with BMI 02/14/2020 02/14/2020 02/14/2020  Height - - -  Weight - - -  BMI - - -  Systolic 528 413 244  Diastolic 60 56 59  Pulse 72 - -

## 2020-02-14 NOTE — Progress Notes (Signed)
Nutrition Follow-up RD working remotely.  DOCUMENTATION CODES:   Obesity unspecified  INTERVENTION:   Continue to offer Ensure Enlive po BID, each supplement provides 350 kcal and 20 grams of protein.  Continue MVI daily.  NUTRITION DIAGNOSIS:   Inadequate oral intake related to decreased appetite as evidenced by per patient/family report.  Ongoing   GOAL:   Patient will meet greater than or equal to 90% of their needs  Unmet  MONITOR:   PO intake, Supplement acceptance, Labs, Weight trends, Skin, I & O's  REASON FOR ASSESSMENT:   Malnutrition Screening Tool    ASSESSMENT:   April [name] is a 64 y.o. female with medical history significant of PCKD s/p renal transplant; HLD; anal condyloma; and CHF presenting with SOB/nausea.  She was previously hospitalized from 3/10-16 for sepsis of uncertain etiology in a transplant recipient - possibly from mucopurulent bronchitis.  She had acute respiratory failure with hypoxia.  ID was involved and she was treated with broad spectrum antibiotics and transitioned to IV ceftriaxone.  She was recommended to go to CIR but declined and went home with home health services.  She felt good when she went home - except that she was having horrible burning diarrhea "from the antibiotics."  That got worse and worse and she couldn't tolerate it anymore.  She didn't have as much help at home to help her clean up.  She has been coughing and dry heaving.  Her last BM was yesterday.  Overnight, she started having a hard time breathing and couldn't quit dry heaving.  She had recurrent fever at home up to 100.4.  She is no longer still having cough.  No urinary symptoms.  Noted rapid response event this morning.   Patient remains on a regular diet. 0% meal intake recorded for breakfast this morning, likely related to acute respiratory event. Since 3/21, patient has been refusing Ensure supplements.   Palliative care team is following with plans for  palliative care to follow as an outpatient when patient is discharged home.   Labs and medications reviewed.     Diet Order:   Diet Order            Diet regular Room service appropriate? Yes; Fluid consistency: Thin  Diet effective now              EDUCATION NEEDS:   Education needs have been addressed  Skin:  Skin Assessment: Reviewed RN Assessment  Last BM:  3/22  Height:   Ht Readings from Last 1 Encounters:  02/08/20 5\' 2"  (1.575 m)    Weight:   Wt Readings from Last 1 Encounters:  02/10/20 80.6 kg    Ideal Body Weight:  50 kg  BMI:  Body mass index is 32.5 kg/m.  Estimated Nutritional Needs:   Kcal:  1900-2100  Protein:  100-115 grams  Fluid:  > 1.9 L    Molli Barrows, RD, LDN, CNSC Please refer to Amion for contact information.

## 2020-02-14 NOTE — Significant Event (Addendum)
Rapid Response Event Note  Overview: Time Called: 9675 Arrival Time: 9163  Delayed in arrival due to another emergency. RN advised to notify attending, which had been done. Event Type: Respiratory   HR 114, RR 29, SpO2 96%, CBG 115  Initial Focused Assessment: Pt lying in bed, diaphoretic. Delayed expiratory wheeze heard. Accessory muscle use, RR 28. SpO2 96% on 2LNC. Pt opens eyes to voice, is lethargic and does not maintain eye opening. Pt is able to follow commands and moves all extremities. Pt is weak, response is delayed. Alertness is much different from my assessment of her on 02/11/2020 for another issue. Pt is warm, moist to touch. Per primary RN, she was told in report that pt was anxious overnight, Ativan 1mg  IV once at 0348 was given. Pt denies pain.   0940: Pt is on BiPAP, same setting as below. Pt opens eyes to voice, nonsustained eye opening. Oxygen saturation 99-100%, RR 20.   1245: On BiPAP. Opens eyes to voice, follow commands. Still weak, lethargic. Removed BiPAP mask for orientation assessment. Pt AOx3, admits to be a little confused about situation. Pt denies pain. Speech is whispered, clear. Resumed BiPAP. ABG ordered. Pt still warm, moist to touch. Rectal temperature checked, 98.59F. Pt had at least 1L of UOP following IV Lasix administration. Lung sounds are clear, BiPAP airflow is heard on auscultation. Pt is noted to have intermittent tremor in her hands. RN unsure if this is new, no focal deficits noted, MD made aware. EKG-NSR. BP 108/66, HR 90, RR 23, SpO2 98% on BiPAP.   1630: Pt lying in bed, asleep. AOx3, disoriented to situation. Pt follows commands and moves all extremities. PERRLA. Pt quickly falls back to sleep if not stimulated. MD denied lactic acid check at this time. Pt compliant with BiPAP and pulling good volumes. Still diaphoretic, noted on pt's forehead. Warm to touch. Weeping in extremities. Pt denies pain. RN notified MD of soft trending BPs, 90s/50s (see  flowsheet). MD advised to continue to diurese unless MAP <65. RN to notify MD if MAP <65, consider albumin. BP 109/59, HR 77, RR 19, SpO2 100% on BiPAP.   1800: RN assessed nystagmus when checking horizontal eye movement and asked RR RN to evaluate. Nystagmus not noted during my assessment. EOMI. Pt is able to follow simple commands. Pt moves all extremities. Pt endorses equal sensation in extremities. Pt is lethargic and takes constant stimuli to keep her engaged. Face is symmetrical. Lung sounds are clear. Rectal temperature checked, 97.7. BP 1212/60, HR 72, RR 16, 100% on BiPAP. CBG 200.   Interventions: -CXR -Establish IV access -80mg  IV Lasix -BiPAP   30%  10/5 -Continuous monitoring  -ABG 7.404/40.9/134/25  Plan of Care (if not transferred): -q18min vital signs & LOC x1 hour -Monitor UOP. Bladder scan if little or no UOP within 1 hour following administration of Lasix.   Call rapid response for further needs.  Event Summary: Name of Physician Notified: Dr. Consepcion Hearing bedside upon my arrival) at 0810 Outcome: Stayed in room and stabalized Event End Time: [place]

## 2020-02-14 NOTE — Progress Notes (Signed)
[name]  PROGRESS NOTE    KERI VEALE  IRS:[id number] DOB: 02-06-1956 DOA: 02/08/2020 PCP: [name], MD   Brief Narrative:   April [name] a 64 y.o.femalewith medical history significant ofPCKD s/p renal transplant; HLD; anal condyloma; and CHF presenting with SOB/nausea. She was previously hospitalized from 3/10-16 for sepsis of uncertain etiology in a transplant recipient - possibly from mucopurulent bronchitis. She had acute respiratory failure with hypoxia. ID was involved and she was treated with broad spectrum antibiotics and transitioned to IV ceftriaxone. She was recommended to go to CIR but declined and went home with home health services. She felt good when she went home - except that she was having horrible burning diarrhea "from the antibiotics." That got worse and worse and she couldn't tolerate it anymore. She didn't have as much help at home to help her clean up. She has been coughing and dry heaving. Her last BM was yesterday. Overnight, she started having a hard time breathing and couldn't quit dry heaving. She had recurrent fever at home up to 100.4. She is no longer still having cough. No urinary symptoms. She fell in January - hip and pelvic fractures as well as wrist fracture. She went to CIR but was in too much pain to be successful. She is now bed bound. Every time she starts to get more mobile, she has a setback or illness. They recommended return to CIR after last hospitalization. She declined because she didn't have the strength to keep up with the program. She refused a SNF rehab due to her immunosuppression. She continues to feel the same about disposition.  02/13/20: Intention was for discharge today. She felt well enough in the AM, but as the afternoon progressed; she felt more short of breath. Will ^MWT and document O2 needs for discharge. V/Q scan is negative. We will also get her a unit of blood (? Symptomatic anemia). Reassess for d/c in  AM.   02/14/20: Worsening respiratory status, volume overload with poor urine output over the past 24 hours.  We will continue aggressive diuresis, BiPAP for respiratory support chest x-ray shows possible bibasilar effusion -increase steroids to cover asthma/COPD flare.   Assessment & Plan:   Principal Problem:   Sepsis ([place]) Active Problems:   Renal transplant recipient   Chronic diastolic CHF (congestive heart failure) (HCC)   Physical deconditioning   Essential hypertension   Glaucoma   SVT (supraventricular tachycardia) (HCC)   Anemia of chronic disease   Palliative care encounter   Goals of care, counseling/discussion  Sepsis in an immunocompromised patient, uncertain etiology - Patient with recent hospitalization, discharged on 3/16 after sepsis from uncertain source - She returned with fever, tachycardia, tachypnea concerning for recurrent sepsis - She did develop severe diarrhea with Augmentin but this appears to have abated - continue stress dose steroids for now; abx have been stopped, she is afebrile ON - Bld Cx NTD - ID onboard, appreciate assistance - 3/20: No fevers ON. 1/4 vials with growth but c/w contamination - decrease steroids - 3/21: continue to wean steroids - 3/23: taper steroids     - 3/24: reinitiate high dose steroids given respiratory decline - hold off on abx, remains afebrile - without acute complaint for infection other than shortness of breath.  Acute hypoxic respiratory failure likely in setting of heart failure exacerbation and volume overload     - Patient appears markedly volume overloaded this morning, restart IV Lasix, 80 IV x1 given with adequate urinary response -negative  over 1 L already this morning; continue 40IV BID thereafter.     - Continue BiPAP for respiratory support     - ABG pending; will titrate BiPAP, oxygen, and diuretics accordingly     - VQ scan negative  Acute metabolic  encephalopathy secondary to above      - Continue to treat hypoxia and respiratory status as above     - Consider further work-up if ABG unremarkable  PCKD s/p renal transplantation - continue immunosuppressants including mycophenolate and tacrolimus - continue stress dose steroids; will need to taper - 3/20: decrease steroids - 3/23: continue to wean steroids     - 3/24: restart methylprednisolone given respiratory status as above  HTN, essential - continue metoprololwith hold parameters - 3/21: d/c metoprolol and start verapamil  Glaucoma/Dry eye - continue home drops  Debility - Since her fall in January, she has been essentially bed bound - She is quite deconditioned and this is unlikely to improve her long-term outcomes - PT/OT: HHPT/OT - Pt is encouraged to get OOB to chair at least q shift - palliative care consultation - 3/22: PT/OT re-eval'd and rec'd CIR. Will speak with CM about this.     - 3/23: pt does not want CIR; wants to go home with HHPT/OT  Asthma - continue dulera, mucinex     - 3/23: lung exam is clear and she's moving good air  Gout - allopurinol  Chronic spasm/pain - robaxin, gabapentin  Anxiety/depression - prozac  GERD - protonix  Macrocytic anemia Thrombocytopenia - hold lovenox - no evidence of bleed - check hemoccult - 3/23: Hgb is 7.1; question symptomatic anemia, transfuse 1 unit pRBCs     - Hgb improved to 8.6     - Plt WNL  SVT - HR up to 190s and unresponsive to metoprolol - given 2 rounds of adenosine; converted, started on verapamil - HR and BP is stable now - 3/22: she is in sinus and tolerating verapamil, continue     - 3/23: she is stable, continue current meds   DVT prophylaxis: SCDs Code Status: FULL Family Communication: Spoke with dtr [name] [phone]) by phone and updated with plan.    Disposition Plan: Continues as inpatient, ultimate disposition unknown, pending resolution of hypoxia, volume overload status and clinical improvement would consider discharge to skilled nursing facility(although she has declined this in the past) versus home with home health.  Consultants:   ID  PC  Subjective: Overnight patient worsening respiratory status, worsening volume status with volume overload, patient at bedside this morning indicates she feels markedly short of breath, denies chest pain, nausea, vomiting, diarrhea, constipation, headache, fevers, chills.  Objective: Vitals:   02/14/20 0000 02/14/20 0100 02/14/20 0200 02/14/20 0726  BP:    (!) 157/72  Pulse:    (!) 116  Resp: (!) 30 (!) 27 (!) 22 (!) 24  Temp:    98.3 F (36.8 C)  TempSrc:    Oral  SpO2: 100% 100% 100% 100%  Weight:      Height:        Intake/Output Summary (Last 24 hours) at 02/14/2020 0734 Last data filed at 02/14/2020 0644 Gross per 24 hour  Intake -  Output 1700 ml  Net -1700 ml   Filed Weights   02/08/20 0728 02/10/20 0613  Weight: 75.5 kg 80.6 kg    Examination:  General: Somnolent but easily arousable, no acute distress but looks uncomfortable HEENT:  Normocephalic atraumatic.  Sclerae nonicteric, noninjected.  Extraocular  movements intact bilaterally. Neck:  Without mass or deformity.  Trachea is midline. Lungs: Prolonged expiratory phase, respiratory rate in the 30s, moderately tachypneic without accessory muscle use, no overt rhonchi's rales or wheeze Heart:  Regular rate and rhythm.  Without murmurs, rubs, or gallops. Abdomen:  Soft, nontender, nondistended.  Without guarding or rebound. Extremities: 3+ pitting edema in both upper and lower extremities bilaterally Vascular:  Dorsalis pedis and posterior tibial pulses palpable bilaterally. Skin:  Warm and dry, no erythema, no ulcerations.   Data Reviewed: I have personally reviewed following labs and imaging  studies.  CBC: Recent Labs  Lab 02/08/20 0435 02/08/20 0541 02/09/20 0307 02/09/20 0307 02/10/20 0327 02/10/20 0924 02/11/20 0246 02/12/20 1407 02/13/20 0337  WBC 8.2   < > 6.3  --  5.9  --  7.3 10.4 7.2  NEUTROABS 6.6  --   --   --  5.0  --  6.5 9.0* 5.6  HGB 8.9*   < > 8.1*   < > 7.2* 7.3* 7.3* 7.7* 7.1*  HCT 31.6*   < > 27.7*   < > 24.5* 25.4* 25.6* 27.0* 25.7*  MCV 99.1   < > 97.5  --  95.3  --  96.6 99.3 100.8*  PLT 197   < > 145*  --  131*  --  153 182 146*   < > = values in this interval not displayed.   Basic Metabolic Panel: Recent Labs  Lab 02/09/20 0307 02/10/20 0327 02/11/20 0246 02/12/20 0605 02/13/20 0337  NA 136 133* 134* 138 138  K 4.5 4.2 4.2 3.9 3.7  CL 104 103 103 106 108  CO2 22 20* 21* 22 22  GLUCOSE 112* 143* 159* 127* 118*  BUN 18 26* 28* 25* 25*  CREATININE 0.81 0.98 1.05* 0.96 1.01*  CALCIUM 9.0 8.9 9.0 9.1 8.8*  MG  --  1.6* 1.5* 1.8 1.6*  PHOS  --   --   --  2.0* 2.5   GFR: Estimated Creatinine Clearance: 56.8 mL/min (A) (by C-G formula based on SCr of 1.01 mg/dL (H)). Liver Function Tests: Recent Labs  Lab 02/08/20 0435 02/10/20 0327 02/11/20 0246 02/12/20 0605 02/13/20 0337  AST 15 13* 21  --  19  ALT 10 8 11   --  17  ALKPHOS 117 83 98  --  103  BILITOT 1.6* 1.1 0.8  --  0.9  PROT 6.8 5.7* 5.9*  --  5.8*  ALBUMIN 2.7* 2.1* 2.2* 2.2* 2.2*   Coagulation Profile: Recent Labs  Lab 02/08/20 0435  INR 1.2   Sepsis Labs: Recent Labs  Lab 02/08/20 0502 02/08/20 0913  PROCALCITON  --  0.34  LATICACIDVEN 0.9  --     Recent Results (from the past 240 hour(s))  Blood Culture (routine x 2)     Status: Abnormal   Collection Time: 02/08/20  4:25 AM   Specimen: BLOOD RIGHT HAND  Result Value Ref Range Status   Specimen Description BLOOD RIGHT HAND  Final   Special Requests   Final    BOTTLES DRAWN AEROBIC AND ANAEROBIC Blood Culture adequate volume   Culture  Setup Time   Final    ANAEROBIC BOTTLE ONLY GRAM POSITIVE COCCI  IN CLUSTERS CRITICAL RESULT CALLED TO, READ BACK BY AND VERIFIED WITH: T MEYER PHARMD 02/09/20 2007 JDW    Culture (A)  Final    STAPHYLOCOCCUS SPECIES (COAGULASE NEGATIVE) THE SIGNIFICANCE OF ISOLATING THIS ORGANISM FROM A SINGLE SET OF BLOOD CULTURES WHEN MULTIPLE  SETS ARE DRAWN IS UNCERTAIN. PLEASE NOTIFY THE MICROBIOLOGY DEPARTMENT WITHIN ONE WEEK IF SPECIATION AND SENSITIVITIES ARE REQUIRED. Performed at [facility], [place] [address], [place], [place] [address]    Report Status 02/11/2020 FINAL  Final  Blood Culture (routine x 2)     Status: None   Collection Time: 02/08/20  4:40 AM   Specimen: BLOOD RIGHT ARM  Result Value Ref Range Status   Specimen Description BLOOD RIGHT ARM  Final   Special Requests   Final    BOTTLES DRAWN AEROBIC AND ANAEROBIC Blood Culture adequate volume Performed at [facility], [place] [address] [place], [place] [address]    Culture NO GROWTH 5 DAYS  Final   Report Status 02/13/2020 FINAL  Final  SARS CORONAVIRUS 2 (TAT 6-24 HRS) Nasopharyngeal Nasopharyngeal Swab     Status: None   Collection Time: 02/08/20  6:50 AM   Specimen: Nasopharyngeal Swab  Result Value Ref Range Status   SARS Coronavirus 2 NEGATIVE NEGATIVE Final    Comment: (NOTE) SARS-CoV-2 target nucleic acids are NOT DETECTED. The SARS-CoV-2 RNA is generally detectable in upper and lower respiratory specimens during the acute phase of infection. Negative results do not preclude SARS-CoV-2 infection, do not rule out co-infections with other pathogens, and should not be used as the sole basis for treatment or other patient management decisions. Negative results must be combined with clinical observations, patient history, and epidemiological information. The expected result is Negative. Fact Sheet for Patients: SugarRoll.be Fact Sheet for Healthcare Providers: https://www.woods-mathews.com/ This test is not yet approved or cleared by  the [place] FDA and  has been authorized for detection and/or diagnosis of SARS-CoV-2 by FDA under an Emergency Use Authorization (EUA). This EUA will remain  in effect (meaning this test can be used) for the duration of the COVID-19 declaration under Section 56 4(b)(1) of the Act, 21 U.S.C. section 360bbb-3(b)(1), unless the authorization is terminated or revoked sooner. Performed at [facility], [place] [address], [place], [place] [address]       Radiology Studies: NM Pulmonary Perfusion  Result Date: 02/13/2020 CLINICAL DATA:  PE suspected.  Positive D-dimer. EXAM: NUCLEAR MEDICINE PERFUSION LUNG SCAN TECHNIQUE: Perfusion images were obtained in multiple projections after intravenous injection of radiopharmaceutical. Ventilation scans intentionally deferred if perfusion scan and chest x-ray adequate for interpretation during COVID 19 epidemic. RADIOPHARMACEUTICALS:  1.7 mCi Tc-64m MAA IV COMPARISON:  Chest radiograph 04/19/2019 FINDINGS: No wedge-shaped peripheral perfusion defects. No segmental defects. No evidence acute pulmonary embolism. IMPRESSION: No evidence of acute pulmonary embolism. Electronically Signed   By: Suzy Bouchard M.D.   On: 02/13/2020 11:39   DG CHEST PORT 1 VIEW  Result Date: 02/12/2020 CLINICAL DATA:  Polycystic kidney disease status post renal transplant, short of breath, nausea, cough EXAM: PORTABLE CHEST 1 VIEW COMPARISON:  02/11/2020 FINDINGS: Single frontal view of the chest demonstrates a stable cardiac silhouette. Small right pleural effusion has developed in the interim. No airspace disease or pneumothorax. Mild central vascular congestion unchanged. No acute bony abnormalities. IMPRESSION: 1. Chronic central vascular congestion. 2. Trace right pleural effusion. Electronically Signed   By: Randa Ngo M.D.   On: 02/12/2020 18:12     Scheduled Meds: . allopurinol  300 mg Oral Daily  . aspirin  81 mg Oral Daily  . brimonidine  1 drop Left  Eye BID  . clonazepam  0.25 mg Oral BID  . cycloSPORINE  1 drop Both Eyes BID  .  dorzolamide  1 drop Both Eyes Daily  . feeding supplement (ENSURE ENLIVE)  237 mL Oral BID BM  . FLUoxetine  40 mg Oral Daily  . gabapentin  200 mg Oral TID  . guaiFENesin  600 mg Oral BID  . latanoprost  1 drop Both Eyes QHS  . magnesium oxide  400 mg Oral BID  . methocarbamol  500 mg Oral QID  . mometasone-formoterol  2 puff Inhalation BID  . multivitamin with minerals  1 tablet Oral Daily  . mycophenolate  180 mg Oral BID  . pantoprazole  40 mg Oral BID  . phosphorus  500 mg Oral TID  . predniSONE  40 mg Oral Q breakfast  . saccharomyces boulardii  250 mg Oral BID  . sodium chloride flush  3 mL Intravenous Q12H  . tacrolimus  0.5 mg Oral QHS  . tacrolimus  1 mg Oral Daily  . verapamil  180 mg Oral Daily   Continuous Infusions: . lactated ringers 100 mL/hr at 02/13/20 0556     LOS: 6 days   Time spent: 8minutes  Little Ishikawa, DO [facility]  If 7PM-7AM, please contact night-coverage [email or web address] 02/14/2020, 7:34 AM

## 2020-02-15 LAB — COMPREHENSIVE METABOLIC PANEL
ALT: 13 U/L (ref 0–44)
AST: 12 U/L — ABNORMAL LOW (ref 15–41)
Albumin: 2 g/dL — ABNORMAL LOW (ref 3.5–5.0)
Alkaline Phosphatase: 97 U/L (ref 38–126)
Anion gap: 10 (ref 5–15)
BUN: 30 mg/dL — ABNORMAL HIGH (ref 8–23)
CO2: 26 mmol/L (ref 22–32)
Calcium: 8.9 mg/dL (ref 8.9–10.3)
Chloride: 103 mmol/L (ref 98–111)
Creatinine, Ser: 1.08 mg/dL — ABNORMAL HIGH (ref 0.44–1.00)
GFR calc Af Amer: >60 mL/min (ref >60)
GFR calc non Af Amer: 55 mL/min — ABNORMAL LOW (ref >60)
Glucose, Bld: 169 mg/dL — ABNORMAL HIGH (ref 70–99)
Potassium: 4.2 mmol/L (ref 3.5–5.1)
Sodium: 139 mmol/L (ref 135–145)
Total Bilirubin: 1.2 mg/dL (ref 0.3–1.2)
Total Protein: 6.3 g/dL — ABNORMAL LOW (ref 6.5–8.1)

## 2020-02-15 LAB — CBC
HCT: 25.7 % — ABNORMAL LOW (ref 36.0–46.0)
Hemoglobin: 7.5 g/dL — ABNORMAL LOW (ref 12.0–15.0)
MCH: 27.7 pg (ref 26.0–34.0)
MCHC: 29.2 g/dL — ABNORMAL LOW (ref 30.0–36.0)
MCV: 94.8 fL (ref 80.0–100.0)
Platelets: 146 10*3/uL — ABNORMAL LOW (ref 150–400)
RBC: 2.71 MIL/uL — ABNORMAL LOW (ref 3.87–5.11)
RDW: 22.1 % — ABNORMAL HIGH (ref 11.5–15.5)
WBC: 11.1 10*3/uL — ABNORMAL HIGH (ref 4.0–10.5)
nRBC: 0 % (ref 0.0–0.2)

## 2020-02-15 NOTE — Progress Notes (Signed)
[name]  PROGRESS NOTE    April [name]  DGL:[id number] DOB: [birth year] DOA: 02/08/2020 PCP: [name], MD   Brief Narrative:   April [name] a 64 y.o.femalewith medical history significant ofPCKD s/p renal transplant; HLD; anal condyloma; and CHF presenting with SOB/nausea. She was previously hospitalized from 3/10-16 for sepsis of uncertain etiology in a transplant recipient - possibly from mucopurulent bronchitis. She had acute respiratory failure with hypoxia. ID was involved and she was treated with broad spectrum antibiotics and transitioned to IV ceftriaxone. She was recommended to go to CIR but declined and went home with home health services. She felt good when she went home - except that she was having horrible burning diarrhea "from the antibiotics." That got worse and worse and she couldn't tolerate it anymore. She didn't have as much help at home to help her clean up. She has been coughing and dry heaving. Her last BM was yesterday. Overnight, she started having a hard time breathing and couldn't quit dry heaving. She had recurrent fever at home up to 100.4. She is no longer still having cough. No urinary symptoms. She fell in January - hip and pelvic fractures as well as wrist fracture. She went to CIR but was in too much pain to be successful. She is now bed bound. Every time she starts to get more mobile, she has a setback or illness. They recommended return to CIR after last hospitalization. She declined because she didn't have the strength to keep up with the program. She refused a SNF rehab due to her immunosuppression. She continues to feel the same about disposition.  02/13/20: Intention was for discharge today. She felt well enough in the AM, but as the afternoon progressed; she felt more short of breath. Will ^MWT and document O2 needs for discharge. V/Q scan is negative. We will also get her a unit of blood (? Symptomatic anemia). Reassess for d/c in  AM.   02/14/20: Worsening respiratory status, volume overload with poor urine output over the past 24 hours.  We will continue aggressive diuresis, BiPAP for respiratory support chest x-ray shows possible bibasilar effusion -increase steroids to cover asthma/COPD flare.  02/15/20: Patient's mental status is markedly improved this morning, patient also diuresed quite well over the past 24 hours, now appears less volume overloaded, not yet euvolemic but markedly improving.  Remains confused about yesterday, indicates she does not recall meeting me for any the episodes or incidents of the previous day.  Patient now awake alert oriented x3, feels quite well but somewhat fatigued and weak.  Assessment & Plan:   Principal Problem:   Sepsis (Early) Active Problems:   Renal transplant recipient   Chronic diastolic CHF (congestive heart failure) (HCC)   Physical deconditioning   Essential hypertension   Glaucoma   SVT (supraventricular tachycardia) (HCC)   Anemia of chronic disease   Palliative care encounter   Goals of care, counseling/discussion  Sepsis in an immunocompromised patient, uncertain etiology - Patient with recent hospitalization, discharged on 3/16 after sepsis from uncertain source - She returned with fever, tachycardia, tachypnea concerning for recurrent sepsis - She did develop severe diarrhea with Augmentin but this appears to have abated previously - Resumed stress dose steroids 3/24; abx have been stopped, she remains afebrile at this time - Bld Cx NTD - ID onboard, appreciate assistance - 3/20: No fevers ON. 1/4 vials with growth but c/w contamination - decrease steroids - 3/21: continue to wean steroids - 3/23: taper steroids     -  3/24: reinitiate high dose steroids given respiratory decline - hold off on abx, remains afebrile - without acute complaint for infection other than shortness of breath.     -3/25: Patient more awake alert  oriented, white count decreased nearly normal, afebrile, more euvolemic and mental status improving, continue supportive care  Acute hypoxic respiratory failure likely in setting of heart failure exacerbation and volume overload, resolving     -Patient diuresing extremely well on Lasix, continue to follow I's and O's, not yet euvolemic but markedly improving from yesterday.     - Continue BiPAP for respiratory support only as needed and at night, currently on 2 L nasal cannula and appears well     - ABG yesterday unremarkable     - VQ scan negative  Acute metabolic encephalopathy secondary to above, resolved     -Patient's mental status now back to baseline, ABG unremarkable, BiPAP removed on 2 L nasal cannula currently maintaining sats well     -Continue to follow clinically, supportive care as indicated  PCKD s/p renal transplantation - continue immunosuppressants including mycophenolate and tacrolimus - continue stress dose steroids; will need to taper - 3/20: decrease steroids - 3/23: continue to wean steroids     - 3/24: restart methylprednisolone given respiratory status as above -will likely taper back down to home steroid dose at discharge  HTN, essential - continue metoprololwith hold parameters - 3/21: d/c metoprolol and start verapamil  Glaucoma/Dry eye - continue home drops  Debility, ambulator dysfunction - Since her fall in January, she has been essentially bed bound - She is quite deconditioned and this is unlikely to improve her long-term outcomes - PT/OT: HHPT/OT - Pt is encouraged to get OOB to chair at least q shift - palliative care consultation - 3/22: PT/OT re-eval'd and rec'd CIR. Will speak with CM about this.     - 3/23: pt does not want CIR; wants to go home with HHPT/OT  Asthma, not in acute exacerbation - continue dulera, mucinex     -Appears to be well controlled  Gout -  allopurinol  Chronic spasm/pain - robaxin, gabapentin  Anxiety/depression - prozac  GERD - protonix  Macrocytic anemia Thrombocytopenia - hold lovenox - no evidence of bleed - check hemoccult - 3/23: Hgb is 7.1; question symptomatic anemia, transfuse 1 unit pRBCs     - Hgb improved to 8.6     - Plt WNL  SVT - HR up to 190s and unresponsive to metoprolol at admission - given 2 rounds of adenosine; converted, started on verapamil - HR and BP is stable now -Currently remains in sinus rhythm, follow clinically, continues on telemetry   DVT prophylaxis: SCDs Code Status: FULL Family Communication: Previously spoke with dtr April [name] [phone]) by phone and updated with plan.   Disposition Plan: Continues as inpatient, ultimate disposition unknown, pending resolution of hypoxia, volume overload status and clinical improvement would consider discharge to skilled nursing facility(although she has declined this in the past) versus home with home health.  Consultants:   ID  PC  Subjective: No acute issues or events overnight, patient's mental status markedly improving, confused about events of yesterday but per herself denies any chest pain, shortness of breath, nausea, vomiting, diarrhea, constipation, headache, fevers, chills.  Objective: Vitals:   02/15/20 0328 02/15/20 0345 02/15/20 0430 02/15/20 0700  BP: 96/62 100/62 (!) 102/59 100/62  Pulse:      Resp: 20 20 (!) 22 19  Temp: 98.4 F (36.9 C)  TempSrc: Axillary     SpO2: 97% 94% 95% 100%  Weight:      Height:        Intake/Output Summary (Last 24 hours) at 02/15/2020 0717 Last data filed at 02/14/2020 2203 Gross per 24 hour  Intake 780 ml  Output 2800 ml  Net -2020 ml   Filed Weights   02/08/20 0728 02/10/20 0613  Weight: 75.5 kg 80.6 kg    Examination:  General: Somnolent but easily arousable, no acute distress but looks uncomfortable HEENT:   Normocephalic atraumatic.  Sclerae nonicteric, noninjected.  Extraocular movements intact bilaterally. Neck:  Without mass or deformity.  Trachea is midline. Lungs: Prolonged expiratory phase, respiratory rate in the 30s, moderately tachypneic without accessory muscle use, no overt rhonchi's rales or wheeze Heart:  Regular rate and rhythm.  Without murmurs, rubs, or gallops. Abdomen:  Soft, nontender, nondistended.  Without guarding or rebound. Extremities: 1+ pitting edema in both upper and lower extremities bilaterally Vascular:  Dorsalis pedis and posterior tibial pulses palpable bilaterally. Skin:  Warm and dry, no erythema, no ulcerations.   Data Reviewed: I have personally reviewed following labs and imaging studies.  CBC: Recent Labs  Lab 02/10/20 0327 02/10/20 0924 02/11/20 0246 02/12/20 1407 02/13/20 0337 02/14/20 0830 02/15/20 0217  WBC 5.9  --  7.3 10.4 7.2 25.0* 11.1*  NEUTROABS 5.0  --  6.5 9.0* 5.6  --   --   HGB 7.2*   < > 7.3* 7.7* 7.1* 8.6* 7.5*  HCT 24.5*   < > 25.6* 27.0* 25.7* 30.0* 25.7*  MCV 95.3  --  96.6 99.3 100.8* 97.1 94.8  PLT 131*  --  153 182 146* 208 146*   < > = values in this interval not displayed.   Basic Metabolic Panel: Recent Labs  Lab 02/10/20 0327 02/10/20 0327 02/11/20 0246 02/12/20 0605 02/13/20 0337 02/14/20 0830 02/15/20 0217  NA 133*   < > 134* 138 138 138 139  K 4.2   < > 4.2 3.9 3.7 4.3 4.2  CL 103   < > 103 106 108 104 103  CO2 20*   < > 21* 22 22 23 26   GLUCOSE 143*   < > 159* 127* 118* 134* 169*  BUN 26*   < > 28* 25* 25* 24* 30*  CREATININE 0.98   < > 1.05* 0.96 1.01* 0.93 1.08*  CALCIUM 8.9   < > 9.0 9.1 8.8* 8.9 8.9  MG 1.6*  --  1.5* 1.8 1.6*  --   --   PHOS  --   --   --  2.0* 2.5  --   --    < > = values in this interval not displayed.   GFR: Estimated Creatinine Clearance: 53.1 mL/min (A) (by C-G formula based on SCr of 1.08 mg/dL (H)). Liver Function Tests: Recent Labs  Lab 02/10/20 0327 02/10/20 0327  02/11/20 0246 02/12/20 0605 02/13/20 0337 02/14/20 0830 02/15/20 0217  AST 13*  --  21  --  19 19 12*  ALT 8  --  11  --  17 17 13   ALKPHOS 83  --  98  --  103 121 97  BILITOT 1.1  --  0.8  --  0.9 1.6* 1.2  PROT 5.7*  --  5.9*  --  5.8* 6.4* 6.3*  ALBUMIN 2.1*   < > 2.2* 2.2* 2.2* 2.5* 2.0*   < > = values in this interval not displayed.   Coagulation Profile: No results  for input(s): INR, PROTIME in the last 168 hours. Sepsis Labs: Recent Labs  Lab 02/08/20 0913  PROCALCITON 0.34    Recent Results (from the past 240 hour(s))  Blood Culture (routine x 2)     Status: Abnormal   Collection Time: 02/08/20  4:25 AM   Specimen: BLOOD RIGHT HAND  Result Value Ref Range Status   Specimen Description BLOOD RIGHT HAND  Final   Special Requests   Final    BOTTLES DRAWN AEROBIC AND ANAEROBIC Blood Culture adequate volume   Culture  Setup Time   Final    ANAEROBIC BOTTLE ONLY GRAM POSITIVE COCCI IN CLUSTERS CRITICAL RESULT CALLED TO, READ BACK BY AND VERIFIED WITH: T MEYER PHARMD 02/09/20 2007 JDW    Culture (A)  Final    STAPHYLOCOCCUS SPECIES (COAGULASE NEGATIVE) THE SIGNIFICANCE OF ISOLATING THIS ORGANISM FROM A SINGLE SET OF BLOOD CULTURES WHEN MULTIPLE SETS ARE DRAWN IS UNCERTAIN. PLEASE NOTIFY THE MICROBIOLOGY DEPARTMENT WITHIN ONE WEEK IF SPECIATION AND SENSITIVITIES ARE REQUIRED. Performed at [facility], [place] [address] [place], [place] [address]    Report Status 02/11/2020 FINAL  Final  Blood Culture (routine x 2)     Status: None   Collection Time: 02/08/20  4:40 AM   Specimen: BLOOD RIGHT ARM  Result Value Ref Range Status   Specimen Description BLOOD RIGHT ARM  Final   Special Requests   Final    BOTTLES DRAWN AEROBIC AND ANAEROBIC Blood Culture adequate volume Performed at [facility], [place] [address] [place], [place] [address]    Culture NO GROWTH 5 DAYS  Final   Report Status 02/13/2020 FINAL  Final  SARS CORONAVIRUS 2 (TAT 6-24 HRS) Nasopharyngeal  Nasopharyngeal Swab     Status: None   Collection Time: 02/08/20  6:50 AM   Specimen: Nasopharyngeal Swab  Result Value Ref Range Status   SARS Coronavirus 2 NEGATIVE NEGATIVE Final    Comment: (NOTE) SARS-CoV-2 target nucleic acids are NOT DETECTED. The SARS-CoV-2 RNA is generally detectable in upper and lower respiratory specimens during the acute phase of infection. Negative results do not preclude SARS-CoV-2 infection, do not rule out co-infections with other pathogens, and should not be used as the sole basis for treatment or other patient management decisions. Negative results must be combined with clinical observations, patient history, and epidemiological information. The expected result is Negative. Fact Sheet for Patients: SugarRoll.be Fact Sheet for Healthcare Providers: https://www.woods-mathews.com/ This test is not yet approved or cleared by the [place] FDA and  has been authorized for detection and/or diagnosis of SARS-CoV-2 by FDA under an Emergency Use Authorization (EUA). This EUA will remain  in effect (meaning this test can be used) for the duration of the COVID-19 declaration under Section 56 4(b)(1) of the Act, 21 U.S.C. section 360bbb-3(b)(1), unless the authorization is terminated or revoked sooner. Performed at [facility], [place] [address] [place], [place] [address]       Radiology Studies: NM Pulmonary Perfusion  Result Date: 02/13/2020 CLINICAL DATA:  PE suspected.  Positive D-dimer. EXAM: NUCLEAR MEDICINE PERFUSION LUNG SCAN TECHNIQUE: Perfusion images were obtained in multiple projections after intravenous injection of radiopharmaceutical. Ventilation scans intentionally deferred if perfusion scan and chest x-ray adequate for interpretation during COVID 19 epidemic. RADIOPHARMACEUTICALS:  1.7 mCi Tc-66m MAA IV COMPARISON:  Chest radiograph 04/19/2019 FINDINGS: No wedge-shaped peripheral perfusion  defects. No segmental defects. No evidence acute pulmonary embolism. IMPRESSION: No evidence of acute pulmonary embolism. Electronically Signed  By: Suzy Bouchard M.D.   On: 02/13/2020 11:39   DG Chest Port 1 View  Result Date: 02/14/2020 CLINICAL DATA:  Wheezing EXAM: PORTABLE CHEST 1 VIEW COMPARISON:  February 12, 2020. FINDINGS: There is no evident edema or airspace opacity. Suspected small right pleural effusion. Heart size and pulmonary vascularity are normal. No adenopathy. No bone lesions. IMPRESSION: Small right pleural effusion, similar to recent study. Lungs otherwise clear. Cardiac silhouette within normal limits. Electronically Signed   By: Lowella Grip III M.D.   On: 02/14/2020 10:00     Scheduled Meds: . allopurinol  300 mg Oral Daily  . aspirin  81 mg Oral Daily  . brimonidine  1 drop Left Eye BID  . Chlorhexidine Gluconate Cloth  6 each Topical Daily  . clonazepam  0.25 mg Oral BID  . cycloSPORINE  1 drop Both Eyes BID  . dorzolamide  1 drop Both Eyes Daily  . feeding supplement (ENSURE ENLIVE)  237 mL Oral BID BM  . FLUoxetine  40 mg Oral Daily  . furosemide  40 mg Intravenous Q12H  . gabapentin  200 mg Oral TID  . guaiFENesin  600 mg Oral BID  . latanoprost  1 drop Both Eyes QHS  . magnesium oxide  400 mg Oral BID  . methocarbamol  500 mg Oral QID  . methylPREDNISolone (SOLU-MEDROL) injection  40 mg Intravenous Q6H  . mometasone-formoterol  2 puff Inhalation BID  . multivitamin with minerals  1 tablet Oral Daily  . mycophenolate  180 mg Oral BID  . pantoprazole  40 mg Oral BID  . phosphorus  500 mg Oral TID  . saccharomyces boulardii  250 mg Oral BID  . sodium chloride flush  3 mL Intravenous Q12H  . tacrolimus  0.5 mg Oral QHS  . tacrolimus  1 mg Oral Daily  . verapamil  180 mg Oral Daily   Continuous Infusions:    LOS: 7 days   Time spent: 82minutes  Little Ishikawa, DO [facility]  If 7PM-7AM, please contact  night-coverage [email or web address] 02/15/2020, 7:17 AM

## 2020-02-15 NOTE — Progress Notes (Signed)
I followed up with patient. April [name] was alert and talking. I offered her space to vice concerns. She said she is feeling much better today. I offered her word of comfort, ministry of presence and prayer. She requested continued visits while she is here.   Chaplain Resident Fidel Levy [phone]

## 2020-02-15 NOTE — Progress Notes (Signed)
11 beats of vtach reported. Asymptomatic. Returned to [facility]R. MD notified, no new orders will continue to monitor

## 2020-02-15 NOTE — Progress Notes (Signed)
Physical Therapy Treatment Patient Details Name: April [name] MRN: [id number] DOB: [birth year] Today's Date: 02/15/2020    History of Present Illness Pt is a 64 y.o. female with PMH significant of PCKD s/p renal transplant; HLD; anal condyloma; CHF presenting with SOB/nausea.  She was previously hospitalized from 3/10-16 for sepsis of uncertain etiology in a transplant recipient - possibly from mucopurulent bronchitis.  She had acute respiratory failure with hypoxia. She was recommended to go to CIR but declined and went home with [facility].  Pt fell in January with subsequent L hip and wrist fxs. Pt started experiencing SOB at home and a fever.    PT Comments    Pt presenting with increased anxiety and SOB upon functional mobility. SpO2 remained >95% throughout tx however once sitting EOB for 10+ minutes pt became increasingly anxious, SOB, and taking short, shallow breaths using accessory musculature. Pt practiced belly breathing in supine with a pillow on her abdomen for a tactile cue for diaphragmatic activation. Pt able to produce an up and down motion at her belly stimulating the diaphragm. However, upright pt unable to simulate same inspiratory motion. Pt did not attempt to transfer as she asks to lay back down due to SOB. D/C plans remain appropriate, however if pt continues to decline in mobility/ medical status then SNF will be most appropriate. PT will continue to monitor and progress as appropriate acutely.   Follow Up Recommendations  CIR     Equipment Recommendations  None recommended by PT       Precautions / Restrictions Precautions Precautions: Fall Precaution Comments: L wrist brace for comfort, exercise as tolerated Restrictions Weight Bearing Restrictions: No LUE Weight Bearing: Weight bearing as tolerated LLE Weight Bearing: Weight bearing as tolerated    Mobility  Bed Mobility Overal bed mobility: Needs Assistance Bed Mobility: Supine to Sit;Sit to Supine      Supine to sit: Mod assist;HOB elevated Sit to supine: Mod assist;+2 for physical assistance;HOB elevated   General bed mobility comments: pt crosses R foot underneath L foot to help with sliding her feet out- difficulty initiating and signifcant weakness requiring mod A x 2 for LEs and trunk  Transfers                 General transfer comment: Did not attempt due to SOB and anxiety  Ambulation/Gait             General Gait Details: unable                 Balance Overall balance assessment: Needs assistance Sitting-balance support: Feet supported;Bilateral upper extremity supported Sitting balance-Leahy Scale: Poor Sitting balance - Comments: Reliance on UE support/ handheld assist to maintain balance Postural control: Posterior lean     Standing balance comment: unable to stand today, pt SOB                            Cognition Arousal/Alertness: Awake/alert Behavior During Therapy: WFL for tasks assessed/performed Overall Cognitive Status: Within Functional Limits for tasks assessed                                 General Comments: Pt is anxious due to SOB- takes short, shallow breaths      Exercises Other Exercises Other Exercises: pursed lip breathing Other Exercises: "belly" breathing (diaphragmatic breathing) in supine using pillow on her abdomen for tactile  cue Other Exercises: sitting EOB ~12 minutes    General Comments General comments (skin integrity, edema, etc.): Pt is on 2L of supp O2 via [place]. SpO2 resting at 99-100%. Pt SOB and wheezes EOB, SpO2 is 96-98%.       Pertinent Vitals/Pain Pain Assessment: Faces Faces Pain Scale: Hurts a little bit Pain Location: L wrist Pain Descriptors / Indicators: Discomfort;Sore Pain Intervention(s): Limited activity within patient's tolerance;Monitored during session;Repositioned           PT Goals (current goals can now be found in the care plan section) Acute Rehab PT  Goals Patient Stated Goal: to be able to breathe PT Goal Formulation: With patient/family Time For Goal Achievement: 02/23/20 Potential to Achieve Goals: Fair Progress towards PT goals: Not progressing toward goals - comment(slow progression due to O2/ medical issues/ SOB and anxiety)    Frequency    Min 3X/week      PT Plan Current plan remains appropriate    Co-evaluation PT/OT/SLP Co-Evaluation/Treatment: Yes Reason for Co-Treatment: Complexity of the patient's impairments (multi-system involvement);For patient/therapist safety;To address functional/ADL transfers PT goals addressed during session: Mobility/safety with mobility OT goals addressed during session: ADL's and self-care;Strengthening/ROM      AM-PAC PT "6 Clicks" Mobility   Outcome Measure  Help needed turning from your back to your side while in a flat bed without using bedrails?: A Little Help needed moving from lying on your back to sitting on the side of a flat bed without using bedrails?: A Lot Help needed moving to and from a bed to a chair (including a wheelchair)?: A Lot Help needed standing up from a chair using your arms (e.g., wheelchair or bedside chair)?: A Lot Help needed to walk in hospital room?: Total Help needed climbing 3-5 steps with a railing? : Total 6 Click Score: 11    End of Session Equipment Utilized During Treatment: Oxygen Activity Tolerance: Patient limited by fatigue;Patient limited by lethargy;Other (comment)(limited by SOB/ anxiety) Patient left: in bed;with call bell/phone within reach;with bed alarm set;with family/visitor present Nurse Communication: Mobility status PT Visit Diagnosis: Muscle weakness (generalized) (M62.81);History of falling (Z91.81);Difficulty in walking, not elsewhere classified (R26.2)     Time: 1007-1219 PT Time Calculation (min) (ACUTE ONLY): 39 min  Charges:  $Therapeutic Activity: 8-22 mins $Neuromuscular Re-education: 8-22 mins                      Jodelle Green, PT, DPT Acute Rehabilitation Services Office [phone]    Jodelle Green 02/15/2020, 4:16 PM

## 2020-02-15 NOTE — Progress Notes (Addendum)
Occupational Therapy Treatment Patient Details Name: April [name] MRN: [id number] DOB: 01-04-1956 Today's Date: 02/15/2020    History of present illness Pt is a 64 y.o. female with PMH significant of PCKD s/p renal transplant; HLD; anal condyloma; CHF presenting with SOB/nausea.  She was previously hospitalized from 3/10-16 for sepsis of uncertain etiology in a transplant recipient - possibly from mucopurulent bronchitis.  She had acute respiratory failure with hypoxia. She was recommended to go to CIR but declined and went home with [facility].  Pt fell in January with subsequent L hip and wrist fxs. Pt started experiencing SOB at home and a fever.   OT comments  Patient supine in bed on arrival.  She was not feeling well and said she was having trouble breathing.  Patient on 2L O2 throughout session with SpO2 remaining >96.  Required mod assist x2 with bed mobility and bilateral UE support to maintain balance at EOB.  Practiced deep breathing and pursed lip breathing as patient having anxiety and feeling SOB, despite SpO2 at ~98%.  Patient not wanting to attempt transfer to chair.  Completed grooming at bed level, incorporating use of L hand with fine motor activities.  Patient may have trouble tolerating CIR level care.  Will continue to monitor. Will continue to follow with OT acutely to address the deficits listed below.    Follow Up Recommendations  CIR    Equipment Recommendations  None recommended by OT    Recommendations for Other Services      Precautions / Restrictions Precautions Precautions: Fall Precaution Comments: L wrist brace for comfort, exercise as tolerated Restrictions Weight Bearing Restrictions: No LUE Weight Bearing: Weight bearing as tolerated LLE Weight Bearing: Weight bearing as tolerated       Mobility Bed Mobility Overal bed mobility: Needs Assistance Bed Mobility: Supine to Sit;Sit to Supine     Supine to sit: Mod assist;HOB elevated Sit to supine: Mod  assist;+2 for physical assistance;HOB elevated   General bed mobility comments: pt crosses R foot underneath L foot to help with sliding her feet out- difficulty initiating and signifcant weakness requiring mod A x 2 for LEs and trunk  Transfers                 General transfer comment: Did not attempt due to SOB and anxiety    Balance Overall balance assessment: Needs assistance Sitting-balance support: Feet supported;Bilateral upper extremity supported Sitting balance-Leahy Scale: Poor Sitting balance - Comments: Reliance on UE support/ handheld assist to maintain balance Postural control: Posterior lean     Standing balance comment: unable to stand today, pt SOB                           ADL either performed or assessed with clinical judgement   ADL Overall ADL's : Needs assistance/impaired     Grooming: Set up;Bed level Grooming Details (indicate cue type and reason): Patient too fatigued and SOB to attemp at EOB                             Functional mobility during ADLs: Moderate assistance;+2 for physical assistance General ADL Comments: Practiced depp breathing and relaxation techniques for anxiety     Vision       Perception     Praxis      Cognition Arousal/Alertness: Awake/alert Behavior During Therapy: WFL for tasks assessed/performed Overall Cognitive Status: Within Functional  Limits for tasks assessed                                 General Comments: Pt is anxious due to SOB- takes short, shallow breaths        Exercises Exercises: Other exercises Other Exercises Other Exercises: pursed lip breathing Other Exercises: "belly" breathing (diaphragmatic breathing); pt has difficulty Other Exercises: sitting EOB ~12 minutes   Shoulder Instructions       General Comments     Pertinent Vitals/ Pain       Pain Assessment: Faces Faces Pain Scale: Hurts a little bit Pain Location: L wrist Pain  Descriptors / Indicators: Discomfort;Sore Pain Intervention(s): Limited activity within patient's tolerance;Monitored during session  Home Living                                          Prior Functioning/Environment              Frequency  Min 2X/week        Progress Toward Goals  OT Goals(current goals can now be found in the care plan section)  Progress towards OT goals: Progressing toward goals  Acute Rehab OT Goals Patient Stated Goal: to be able to breathe OT Goal Formulation: With patient Time For Goal Achievement: 02/19/20 Potential to Achieve Goals: Good  Plan Discharge plan remains appropriate    Co-evaluation    PT/OT/SLP Co-Evaluation/Treatment: Yes Reason for Co-Treatment: Complexity of the patient's impairments (multi-system involvement);For patient/therapist safety;To address functional/ADL transfers PT goals addressed during session: Mobility/safety with mobility OT goals addressed during session: ADL's and self-care;Strengthening/ROM      AM-PAC OT "6 Clicks" Daily Activity     Outcome Measure   Help from another person eating meals?: A Little Help from another person taking care of personal grooming?: A Little Help from another person toileting, which includes using toliet, bedpan, or urinal?: A Lot Help from another person bathing (including washing, rinsing, drying)?: A Lot Help from another person to put on and taking off regular upper body clothing?: A Little Help from another person to put on and taking off regular lower body clothing?: A Lot 6 Click Score: 15    End of Session Equipment Utilized During Treatment: Oxygen  OT Visit Diagnosis: Other abnormalities of gait and mobility (R26.89);Muscle weakness (generalized) (M62.81);Pain;History of falling (Z91.81) Pain - Right/Left: Left Pain - part of body: Hand   Activity Tolerance Patient tolerated treatment well   Patient Left in bed;with call bell/phone within  reach;with bed alarm set;with family/visitor present   Nurse Communication Mobility status        Time: 1310-1351 OT Time Calculation (min): 41 min  Charges: OT General Charges $OT Visit: 1 Visit OT Treatments $Self Care/Home Management : 8-22 mins  August Luz, OTR/L    Phylliss Bob 02/15/2020, 4:08 PM

## 2020-02-16 LAB — CBC
HCT: 26.5 % — ABNORMAL LOW (ref 36.0–46.0)
Hemoglobin: 7.7 g/dL — ABNORMAL LOW (ref 12.0–15.0)
MCH: 27.6 pg (ref 26.0–34.0)
MCHC: 29.1 g/dL — ABNORMAL LOW (ref 30.0–36.0)
MCV: 95 fL (ref 80.0–100.0)
Platelets: 163 10*3/uL (ref 150–400)
RBC: 2.79 MIL/uL — ABNORMAL LOW (ref 3.87–5.11)
RDW: 21.7 % — ABNORMAL HIGH (ref 11.5–15.5)
WBC: 7.3 10*3/uL (ref 4.0–10.5)
nRBC: 0.3 % — ABNORMAL HIGH (ref 0.0–0.2)

## 2020-02-16 LAB — COMPREHENSIVE METABOLIC PANEL
ALT: 14 U/L (ref 0–44)
AST: 13 U/L — ABNORMAL LOW (ref 15–41)
Albumin: 2.1 g/dL — ABNORMAL LOW (ref 3.5–5.0)
Alkaline Phosphatase: 88 U/L (ref 38–126)
Anion gap: 14 (ref 5–15)
BUN: 46 mg/dL — ABNORMAL HIGH (ref 8–23)
CO2: 25 mmol/L (ref 22–32)
Calcium: 8.5 mg/dL — ABNORMAL LOW (ref 8.9–10.3)
Chloride: 98 mmol/L (ref 98–111)
Creatinine, Ser: 1.31 mg/dL — ABNORMAL HIGH (ref 0.44–1.00)
GFR calc Af Amer: 50 mL/min — ABNORMAL LOW (ref >60)
GFR calc non Af Amer: 44 mL/min — ABNORMAL LOW (ref >60)
Glucose, Bld: 198 mg/dL — ABNORMAL HIGH (ref 70–99)
Potassium: 4.5 mmol/L (ref 3.5–5.1)
Sodium: 137 mmol/L (ref 135–145)
Total Bilirubin: 1.1 mg/dL (ref 0.3–1.2)
Total Protein: 6.1 g/dL — ABNORMAL LOW (ref 6.5–8.1)

## 2020-02-16 MED ORDER — FUROSEMIDE 10 MG/ML IJ SOLN
20.0000 mg | Freq: Two times a day (BID) | INTRAMUSCULAR | Status: DC
  Administered 2020-02-16 – 2020-02-19 (×6): 20 mg via INTRAVENOUS
  Filled 2020-02-16 (×6): qty 2

## 2020-02-16 MED ORDER — PREDNISONE 20 MG PO TABS
20.0000 mg | ORAL_TABLET | Freq: Every day | ORAL | Status: DC
  Administered 2020-02-17 – 2020-02-22 (×6): 20 mg via ORAL
  Filled 2020-02-16 (×7): qty 1

## 2020-02-16 NOTE — Progress Notes (Signed)
[name]  PROGRESS NOTE    April [name]  WCH:[id number] DOB: 1956-05-24 DOA: 02/08/2020 PCP: [name], MD   Brief Narrative:   April [name] a 64 y.o.femalewith medical history significant ofPCKD s/p renal transplant; HLD; anal condyloma; and CHF presenting with SOB/nausea. She was previously hospitalized from 3/10-16 for sepsis of uncertain etiology in a transplant recipient - possibly from mucopurulent bronchitis. She had acute respiratory failure with hypoxia. ID was involved and she was treated with broad spectrum antibiotics and transitioned to IV ceftriaxone. She was recommended to go to CIR but declined and went home with home health services. She felt good when she went home - except that she was having horrible burning diarrhea "from the antibiotics." That got worse and worse and she couldn't tolerate it anymore. She didn't have as much help at home to help her clean up. She has been coughing and dry heaving. Her last BM was yesterday. Overnight, she started having a hard time breathing and couldn't quit dry heaving. She had recurrent fever at home up to 100.4. She is no longer still having cough. No urinary symptoms. She fell in January - hip and pelvic fractures as well as wrist fracture. She went to CIR but was in too much pain to be successful. She is now bed bound. Every time she starts to get more mobile, she has a setback or illness. They recommended return to CIR after last hospitalization. She declined because she didn't have the strength to keep up with the program. She refused a SNF rehab due to her immunosuppression. She continues to feel the same about disposition.  02/13/20: Intention was for discharge today. She felt well enough in the AM, but as the afternoon progressed; she felt more short of breath. Will ^MWT and document O2 needs for discharge. V/Q scan is negative. We will also get her a unit of blood (? Symptomatic anemia). Reassess for d/c in  AM.   02/14/20: Worsening respiratory status, volume overload with poor urine output over the past 24 hours.  We will continue aggressive diuresis, BiPAP for respiratory support chest x-ray shows possible bibasilar effusion -increase steroids to cover asthma/COPD flare.  02/15/20: Patient's mental status is markedly improved this morning, patient also diuresed quite well over the past 24 hours, now appears less volume overloaded, not yet euvolemic but markedly improving.  Remains confused about yesterday, indicates she does not recall meeting me for any the episodes or incidents of the previous day.  Patient now awake alert oriented x3, feels quite well but somewhat fatigued and weak.  3/26: Appears back to baseline, requesting discharge home however we discussed she is still likely too weak to be discharged home despite support at home and home health.  Defer to PT, appears the patient is being evaluated by CIR as well.  Assessment & Plan:   Principal Problem:   Sepsis ([place]) Active Problems:   Renal transplant recipient   Chronic diastolic CHF (congestive heart failure) (HCC)   Physical deconditioning   Essential hypertension   Glaucoma   SVT (supraventricular tachycardia) (HCC)   Anemia of chronic disease   Palliative care encounter   Goals of care, counseling/discussion  SIRS without source in an immunocompromised patient, uncertain etiology, POA, resolving - Patient with recent hospitalization, discharged on 3/16 after sepsis from uncertain source - She returned with fever, tachycardia, tachypnea concerning for recurrent sepsis - She did develop severe diarrhea with Augmentin but this appears to have abated previously - Resumed stress dose  steroids 3/24; abx have been stopped, she remains afebrile at this time - Bld Cx NTD - ID onboard, appreciate assistance - 3/20: No fevers ON. 1/4 vials with growth but c/w contamination - decrease steroids -  3/21: continue to wean steroids - 3/23: taper steroids     - 3/24: reinitiate high dose steroids given respiratory decline - hold off on abx, remains afebrile - without acute complaint for infection other than shortness of breath.     -3/25: Patient more awake alert oriented, white count decreased nearly normal, afebrile, more euvolemic and mental status improving, continue supportive care     -3/26: wean steroids/lasix - prepare for DC once safe dispo can be obtained  Acute hypoxic respiratory failure likely in setting of heart failure exacerbation and volume overload, resolving     -Patient diuresing extremely well on Lasix, continue to follow I's and O's, not yet euvolemic but markedly improving from yesterday.     - Continue BiPAP for respiratory support only as needed and at night, currently on 2 L nasal cannula and appears well     - ABG yesterday unremarkable     - VQ scan negative  Acute metabolic encephalopathy secondary to above, resolved     -Patient's mental status now back to baseline, ABG unremarkable, BiPAP removed on 2 L nasal cannula currently maintaining sats well     -Continue to follow clinically, supportive care as indicated  PCKD s/p renal transplantation - continue immunosuppressants including mycophenolate and tacrolimus - continue stress dose steroids; will need to taper - 3/20: decrease steroids - 3/23: continue to wean steroids     - 3/24: restart methylprednisolone given respiratory status as above - tapering down now  HTN, essential - continue metoprololwith hold parameters - 3/21: d/c metoprolol and start verapamil  Glaucoma/Dry eye - continue home drops  Debility, ambulator dysfunction - Since her fall in January, she has been essentially bed bound - She is quite deconditioned and this is unlikely to improve her long-term outcomes - PT/OT: HHPT/OT - Pt is encouraged to get OOB to chair at least q  shift - palliative care consultation - 3/22: PT/OT re-eval'd and rec'd CIR. Will speak with CM about this.     - 3/23: pt does not want CIR; wants to go home with HHPT/OT  Asthma, not in acute exacerbation - continue dulera, mucinex     -Appears to be well controlled  Gout - allopurinol  Chronic spasm/pain - robaxin, gabapentin  Anxiety/depression - prozac  GERD - protonix  Macrocytic anemia Thrombocytopenia - hold lovenox - no evidence of bleed - check hemoccult - 3/23: Hgb is 7.1; question symptomatic anemia, transfuse 1 unit pRBCs     - Hgb improved to 8.6     - Plt WNL  SVT - HR up to 190s and unresponsive to metoprolol at admission - given 2 rounds of adenosine; converted, started on verapamil - HR and BP is stable now -Currently remains in sinus rhythm, follow clinically, continues on telemetry   DVT prophylaxis: SCDs Code Status: FULL Family Communication: Previously spoke with dtr [name] [phone]) by phone and updated with plan.   Disposition Plan: Continues as inpatient, ultimate disposition likely SNF/CIR although she is requesting discharge home with home-health (I do not feel this is an appropriate discharge location at this time)  Consultants:   ID  PC  Subjective: No acute issues or events overnight, patient's mental status markedly improving, confused about events of yesterday but per herself denies  any chest pain, shortness of breath, nausea, vomiting, diarrhea, constipation, headache, fevers, chills.  Objective: Vitals:   02/15/20 1946 02/15/20 2100 02/15/20 2344 02/16/20 0410  BP: 96/65  (!) 108/58 113/67  Pulse: 76 79 78 73  Resp: 17 18 19 17   Temp:   98 F (36.7 C) 97.9 F (36.6 C)  TempSrc: Oral  Axillary Axillary  SpO2: 100% 100% 99% 100%  Weight:      Height:        Intake/Output Summary (Last 24 hours) at 02/16/2020 0741 Last data filed at  02/16/2020 0000 Gross per 24 hour  Intake --  Output 900 ml  Net -900 ml   Filed Weights   02/08/20 0728 02/10/20 0613  Weight: 75.5 kg 80.6 kg    Examination:  General: Somnolent but easily arousable, no acute distress but looks uncomfortable HEENT:  Normocephalic atraumatic.  Sclerae nonicteric, noninjected.  Extraocular movements intact bilaterally. Neck:  Without mass or deformity.  Trachea is midline. Lungs: Prolonged expiratory phase, respiratory rate in the 30s, moderately tachypneic without accessory muscle use, no overt rhonchi's rales or wheeze Heart:  Regular rate and rhythm.  Without murmurs, rubs, or gallops. Abdomen:  Soft, nontender, nondistended.  Without guarding or rebound. Extremities: 1+ pitting edema in both upper and lower extremities bilaterally Vascular:  Dorsalis pedis and posterior tibial pulses palpable bilaterally. Skin:  Warm and dry, no erythema, no ulcerations.   Data Reviewed: I have personally reviewed following labs and imaging studies.  CBC: Recent Labs  Lab 02/10/20 0327 02/10/20 0924 02/11/20 0246 02/11/20 0246 02/12/20 1407 02/13/20 0337 02/14/20 0830 02/15/20 0217 02/16/20 0309  WBC 5.9  --  7.3   < > 10.4 7.2 25.0* 11.1* 7.3  NEUTROABS 5.0  --  6.5  --  9.0* 5.6  --   --   --   HGB 7.2*   < > 7.3*   < > 7.7* 7.1* 8.6* 7.5* 7.7*  HCT 24.5*   < > 25.6*   < > 27.0* 25.7* 30.0* 25.7* 26.5*  MCV 95.3  --  96.6   < > 99.3 100.8* 97.1 94.8 95.0  PLT 131*  --  153   < > 182 146* 208 146* 163   < > = values in this interval not displayed.   Basic Metabolic Panel: Recent Labs  Lab 02/10/20 0327 02/10/20 0327 02/11/20 0246 02/11/20 0246 02/12/20 0093 02/13/20 0337 02/14/20 0830 02/15/20 0217 02/16/20 0309  NA 133*   < > 134*   < > 138 138 138 139 137  K 4.2   < > 4.2   < > 3.9 3.7 4.3 4.2 4.5  CL 103   < > 103   < > 106 108 104 103 98  CO2 20*   < > 21*   < > 22 22 23 26 25   GLUCOSE 143*   < > 159*   < > 127* 118* 134* 169*  198*  BUN 26*   < > 28*   < > 25* 25* 24* 30* 46*  CREATININE 0.98   < > 1.05*   < > 0.96 1.01* 0.93 1.08* 1.31*  CALCIUM 8.9   < > 9.0   < > 9.1 8.8* 8.9 8.9 8.5*  MG 1.6*  --  1.5*  --  1.8 1.6*  --   --   --   PHOS  --   --   --   --  2.0* 2.5  --   --   --    < > =  values in this interval not displayed.   GFR: Estimated Creatinine Clearance: 43.8 mL/min (A) (by C-G formula based on SCr of 1.31 mg/dL (H)). Liver Function Tests: Recent Labs  Lab 02/11/20 0246 02/11/20 0246 02/12/20 0605 02/13/20 0337 02/14/20 0830 02/15/20 0217 02/16/20 0309  AST 21  --   --  19 19 12* 13*  ALT 11  --   --  17 17 13 14   ALKPHOS 98  --   --  103 121 97 88  BILITOT 0.8  --   --  0.9 1.6* 1.2 1.1  PROT 5.9*  --   --  5.8* 6.4* 6.3* 6.1*  ALBUMIN 2.2*   < > 2.2* 2.2* 2.5* 2.0* 2.1*   < > = values in this interval not displayed.   Coagulation Profile: No results for input(s): INR, PROTIME in the last 168 hours. Sepsis Labs: No results for input(s): PROCALCITON, LATICACIDVEN in the last 168 hours.  Recent Results (from the past 240 hour(s))  Blood Culture (routine x 2)     Status: Abnormal   Collection Time: 02/08/20  4:25 AM   Specimen: BLOOD RIGHT HAND  Result Value Ref Range Status   Specimen Description BLOOD RIGHT HAND  Final   Special Requests   Final    BOTTLES DRAWN AEROBIC AND ANAEROBIC Blood Culture adequate volume   Culture  Setup Time   Final    ANAEROBIC BOTTLE ONLY GRAM POSITIVE COCCI IN CLUSTERS CRITICAL RESULT CALLED TO, READ BACK BY AND VERIFIED WITH: T MEYER PHARMD 02/09/20 2007 JDW    Culture (A)  Final    STAPHYLOCOCCUS SPECIES (COAGULASE NEGATIVE) THE SIGNIFICANCE OF ISOLATING THIS ORGANISM FROM A SINGLE SET OF BLOOD CULTURES WHEN MULTIPLE SETS ARE DRAWN IS UNCERTAIN. PLEASE NOTIFY THE MICROBIOLOGY DEPARTMENT WITHIN ONE WEEK IF SPECIATION AND SENSITIVITIES ARE REQUIRED. Performed at [facility], [place] [address] [place], [place] [address]    Report Status  02/11/2020 FINAL  Final  Blood Culture (routine x 2)     Status: None   Collection Time: 02/08/20  4:40 AM   Specimen: BLOOD RIGHT ARM  Result Value Ref Range Status   Specimen Description BLOOD RIGHT ARM  Final   Special Requests   Final    BOTTLES DRAWN AEROBIC AND ANAEROBIC Blood Culture adequate volume Performed at [facility], [place] [address], [place], [place] [address]    Culture NO GROWTH 5 DAYS  Final   Report Status 02/13/2020 FINAL  Final  SARS CORONAVIRUS 2 (TAT 6-24 HRS) Nasopharyngeal Nasopharyngeal Swab     Status: None   Collection Time: 02/08/20  6:50 AM   Specimen: Nasopharyngeal Swab  Result Value Ref Range Status   SARS Coronavirus 2 NEGATIVE NEGATIVE Final    Comment: (NOTE) SARS-CoV-2 target nucleic acids are NOT DETECTED. The SARS-CoV-2 RNA is generally detectable in upper and lower respiratory specimens during the acute phase of infection. Negative results do not preclude SARS-CoV-2 infection, do not rule out co-infections with other pathogens, and should not be used as the sole basis for treatment or other patient management decisions. Negative results must be combined with clinical observations, patient history, and epidemiological information. The expected result is Negative. Fact Sheet for Patients: SugarRoll.be Fact Sheet for Healthcare Providers: https://www.woods-mathews.com/ This test is not yet approved or cleared by the [place] FDA and  has been authorized for detection and/or diagnosis of SARS-CoV-2 by FDA under an Emergency Use Authorization (EUA). This EUA will remain  in effect (  meaning this test can be used) for the duration of the COVID-19 declaration under Section 56 4(b)(1) of the Act, 21 U.S.C. section 360bbb-3(b)(1), unless the authorization is terminated or revoked sooner. Performed at [facility], [place] [address] [place], [place] [address]       Radiology Studies: DG  Chest Port 1 View  Result Date: 02/14/2020 CLINICAL DATA:  Wheezing EXAM: PORTABLE CHEST 1 VIEW COMPARISON:  February 12, 2020. FINDINGS: There is no evident edema or airspace opacity. Suspected small right pleural effusion. Heart size and pulmonary vascularity are normal. No adenopathy. No bone lesions. IMPRESSION: Small right pleural effusion, similar to recent study. Lungs otherwise clear. Cardiac silhouette within normal limits. Electronically Signed   By: Lowella Grip III M.D.   On: 02/14/2020 10:00     Scheduled Meds: . allopurinol  300 mg Oral Daily  . aspirin  81 mg Oral Daily  . brimonidine  1 drop Left Eye BID  . Chlorhexidine Gluconate Cloth  6 each Topical Daily  . clonazepam  0.25 mg Oral BID  . cycloSPORINE  1 drop Both Eyes BID  . dorzolamide  1 drop Both Eyes Daily  . feeding supplement (ENSURE ENLIVE)  237 mL Oral BID BM  . FLUoxetine  40 mg Oral Daily  . furosemide  40 mg Intravenous Q12H  . gabapentin  200 mg Oral TID  . guaiFENesin  600 mg Oral BID  . latanoprost  1 drop Both Eyes QHS  . magnesium oxide  400 mg Oral BID  . methocarbamol  500 mg Oral QID  . mometasone-formoterol  2 puff Inhalation BID  . multivitamin with minerals  1 tablet Oral Daily  . mycophenolate  180 mg Oral BID  . pantoprazole  40 mg Oral BID  . phosphorus  500 mg Oral TID  . saccharomyces boulardii  250 mg Oral BID  . sodium chloride flush  3 mL Intravenous Q12H  . tacrolimus  0.5 mg Oral QHS  . tacrolimus  1 mg Oral Daily  . verapamil  180 mg Oral Daily   Continuous Infusions:    LOS: 8 days   Time spent: 37minutes  Little Ishikawa, DO [facility]  If 7PM-7AM, please contact night-coverage [email or web address] 02/16/2020, 7:41 AM

## 2020-02-16 NOTE — Progress Notes (Signed)
PT was ID using name, DOB and MRN   

## 2020-02-16 NOTE — Progress Notes (Signed)
Inpatient Rehabilitation-Admissions Coordinator   [facility] continues to follow at a distance. Pt has declined a return to CIR many times this admission. Based on her previous therapy notes, pt has not yet demonstrated an ability to tolerance 3 hours of therapy a day. If pt is medically ready for DC, would recommend SNF as pt will need a slower paced, less intensive rehab.   Raechel Ache, OTR/L  Rehab Admissions Coordinator  [phone] 02/16/2020 1:35 PM

## 2020-02-17 ENCOUNTER — Inpatient Hospital Stay (HOSPITAL_COMMUNITY): Payer: Medicare Other

## 2020-02-17 LAB — COMPREHENSIVE METABOLIC PANEL
ALT: 14 U/L (ref 0–44)
AST: 13 U/L — ABNORMAL LOW (ref 15–41)
Albumin: 2.2 g/dL — ABNORMAL LOW (ref 3.5–5.0)
Alkaline Phosphatase: 75 U/L (ref 38–126)
Anion gap: 10 (ref 5–15)
BUN: 48 mg/dL — ABNORMAL HIGH (ref 8–23)
CO2: 27 mmol/L (ref 22–32)
Calcium: 8.4 mg/dL — ABNORMAL LOW (ref 8.9–10.3)
Chloride: 101 mmol/L (ref 98–111)
Creatinine, Ser: 1.24 mg/dL — ABNORMAL HIGH (ref 0.44–1.00)
GFR calc Af Amer: 54 mL/min — ABNORMAL LOW (ref >60)
GFR calc non Af Amer: 47 mL/min — ABNORMAL LOW (ref >60)
Glucose, Bld: 184 mg/dL — ABNORMAL HIGH (ref 70–99)
Potassium: 4.1 mmol/L (ref 3.5–5.1)
Sodium: 138 mmol/L (ref 135–145)
Total Bilirubin: 0.9 mg/dL (ref 0.3–1.2)
Total Protein: 6 g/dL — ABNORMAL LOW (ref 6.5–8.1)

## 2020-02-17 LAB — CBC
HCT: 25.9 % — ABNORMAL LOW (ref 36.0–46.0)
Hemoglobin: 7.5 g/dL — ABNORMAL LOW (ref 12.0–15.0)
MCH: 27.8 pg (ref 26.0–34.0)
MCHC: 29 g/dL — ABNORMAL LOW (ref 30.0–36.0)
MCV: 95.9 fL (ref 80.0–100.0)
Platelets: 165 10*3/uL (ref 150–400)
RBC: 2.7 MIL/uL — ABNORMAL LOW (ref 3.87–5.11)
RDW: 21.4 % — ABNORMAL HIGH (ref 11.5–15.5)
WBC: 5.3 10*3/uL (ref 4.0–10.5)
nRBC: 0 % (ref 0.0–0.2)

## 2020-02-17 NOTE — Progress Notes (Addendum)
0230: Pt. Complaining of SOB after waking up. Wheezing auscultated in left lobe. PRN Albuterol given. STAT chest xray obtained.

## 2020-02-17 NOTE — Progress Notes (Signed)
April [name] Kitchen  PROGRESS NOTE    April [name]  PTW:[id number] DOB: August 09, 1956 DOA: 02/08/2020 PCP: [name], MD   Brief Narrative:   April [name] a 64 y.o.femalewith medical history significant ofPCKD s/p renal transplant; HLD; anal condyloma; and CHF presenting with SOB/nausea. She was previously hospitalized from 3/10-16 for sepsis of uncertain etiology in a transplant recipient - possibly from mucopurulent bronchitis. She had acute respiratory failure with hypoxia. ID was involved and she was treated with broad spectrum antibiotics and transitioned to IV ceftriaxone. She was recommended to go to CIR but declined and went home with home health services. She felt good when she went home - except that she was having horrible burning diarrhea "from the antibiotics." That got worse and worse and she couldn't tolerate it anymore. She didn't have as much help at home to help her clean up. She has been coughing and dry heaving. Her last BM was yesterday. Overnight, she started having a hard time breathing and couldn't quit dry heaving. She had recurrent fever at home up to 100.4. She is no longer still having cough. No urinary symptoms. She fell in January - hip and pelvic fractures as well as wrist fracture. She went to CIR but was in too much pain to be successful. She is now bed bound. Every time she starts to get more mobile, she has a setback or illness. They recommended return to CIR after last hospitalization. She declined because she didn't have the strength to keep up with the program. She refused a SNF rehab due to her immunosuppression. She continues to feel the same about disposition.  02/13/20: Intention was for discharge today. She felt well enough in the AM, but as the afternoon progressed; she felt more short of breath. Will ^MWT and document O2 needs for discharge. V/Q scan is negative. We will also get her a unit of blood (? Symptomatic anemia). Reassess for d/c in  AM.   02/14/20: Worsening respiratory status, volume overload with poor urine output over the past 24 hours.  We will continue aggressive diuresis, BiPAP for respiratory support chest x-ray shows possible bibasilar effusion -increase steroids to cover asthma/COPD flare.  02/15/20: Patient's mental status is markedly improved this morning, patient also diuresed quite well over the past 24 hours, now appears less volume overloaded, not yet euvolemic but markedly improving.  Remains confused about yesterday, indicates she does not recall meeting me for any the episodes or incidents of the previous day.  Patient now awake alert oriented x3, feels quite well but somewhat fatigued and weak.  3/26-27: Appears back to baseline, requesting discharge home however we discussed she is still likely too weak to be discharged home despite support at home and home health.  Defer to PT, appears the patient is being evaluated by CIR as well but unwilling to go back to CIR as she does not feel she is able to keep up with physical therapy there now that she is so weak.  Assessment & Plan:   Principal Problem:   Sepsis ([place]) Active Problems:   Renal transplant recipient   Chronic diastolic CHF (congestive heart failure) (HCC)   Physical deconditioning   Essential hypertension   Glaucoma   SVT (supraventricular tachycardia) (HCC)   Anemia of chronic disease   Palliative care encounter   Goals of care, counseling/discussion  SIRS without source in an immunocompromised patient, uncertain etiology, POA, resolving - Patient with recent hospitalization, discharged on 3/16 after sepsis from uncertain source - She  returned with fever, tachycardia, tachypnea concerning for recurrent sepsis - She did develop severe diarrhea with Augmentin but this appears to have abated previously - Resumed stress dose steroids 3/24; abx have been stopped, she remains afebrile at this time - Bld Cx NTD - ID  onboard, appreciate assistance - 3/20: No fevers ON. 1/4 vials with growth but c/w contamination - decrease steroids - 3/21: continue to wean steroids - 3/23: taper steroids     - 3/24: reinitiate high dose steroids given respiratory decline - hold off on abx, remains afebrile - without acute complaint for infection other than shortness of breath.     -3/25: Patient more awake alert oriented, white count decreased nearly normal, afebrile, more euvolemic and mental status improving, continue supportive care     -3/26: wean steroids/lasix - prepare for DC once safe dispo can be obtained  Acute hypoxic respiratory failure likely in setting of heart failure exacerbation and volume overload, resolving     -Patient diuresing extremely well on Lasix, continue to follow I's and O's, not yet euvolemic but markedly improving from yesterday.     - Continue BiPAP for respiratory support only as needed and at night, currently on 2 L nasal cannula and appears well     - ABG yesterday unremarkable     - VQ scan negative  Acute metabolic encephalopathy secondary to above, resolved     -Patient's mental status now back to baseline, ABG unremarkable, BiPAP removed on 2 L nasal cannula currently maintaining sats well     -Continue to follow clinically, supportive care as indicated  PCKD s/p renal transplantation - continue immunosuppressants including mycophenolate and tacrolimus - continue stress dose steroids; will need to taper - 3/20: decrease steroids - 3/23: continue to wean steroids     - 3/24: restart methylprednisolone given respiratory status as above - tapering down now  HTN, essential - 3/21: d/c metoprolol and start verapamil - well controlled currently  Glaucoma/Dry eye - continue home drops  Debility, ambulator dysfunction - Since her fall in January, she has been essentially bed bound - She is quite deconditioned and this is unlikely  to improve her long-term outcomes - PT/OT: HHPT/OT - Pt is encouraged to get OOB to chair at least q shift - palliative care consultation - 3/22: PT/OT re-eval'd and rec'd CIR. Will speak with CM about this.     - 3/23: pt does not want CIR; wants to go home with HHPT/OT  Asthma, not in acute exacerbation - continue dulera, mucinex     -Appears to be well controlled  Gout - allopurinol  Chronic spasm/pain - robaxin, gabapentin  Anxiety/depression - prozac  GERD - protonix  Macrocytic anemia Thrombocytopenia - hold lovenox - no evidence of bleed - check hemoccult - 3/23: Hgb is 7.1; question symptomatic anemia, transfuse 1 unit pRBCs     - Hgb improved to 8.6     - Plt WNL  SVT, transient episode - HR up to 190s and unresponsive to metoprolol at admission - given 2 rounds of adenosine; converted, started on verapamil - HR and BP is stable now -Currently remains in sinus rhythm, follow clinically, continues on telemetry   DVT prophylaxis: SCDs Code Status: FULL Family Communication: Previously spoke with dtr [name] [phone]) by phone and updated with plan.   Disposition Plan: Continues as inpatient, ultimate disposition likely SNF  although she is requesting discharge home with home-health (I do not feel this is an appropriate discharge location at this  time)  Consultants:   ID  PC  Subjective: No acute issues or events overnight, patient's mental status is back to baseline, denies nausea, vomiting, diarrhea, constipation, headache, fevers, chills..  Objective: Vitals:   02/16/20 2328 02/17/20 0300 02/17/20 0359 02/17/20 0400  BP: 122/68   133/64  Pulse: 77   77  Resp: (!) 24 19  17   Temp: 98 F (36.7 C)  97.7 F (36.5 C)   TempSrc: Oral  Axillary   SpO2: 98% 100%  98%  Weight:      Height:        Intake/Output Summary (Last 24 hours) at 02/17/2020 3785 Last data  filed at 02/16/2020 2300 Gross per 24 hour  Intake 360 ml  Output 1300 ml  Net -940 ml   Filed Weights   02/08/20 0728 02/10/20 0613  Weight: 75.5 kg 80.6 kg    Examination:  General: Resting comfortably in bed, awake alert oriented x3, no acute distress HEENT:  Normocephalic atraumatic.  Sclerae nonicteric, noninjected.  Extraocular movements intact bilaterally. Neck:  Without mass or deformity.  Trachea is midline. Lungs: Without overt wheezes rales or rhonchi Heart:  Regular rate and rhythm.  Without murmurs, rubs, or gallops. Abdomen:  Soft, nontender, nondistended.  Without guarding or rebound. Extremities: 1+ pitting edema in both upper and lower extremities bilaterally Vascular:  Dorsalis pedis and posterior tibial pulses palpable bilaterally. Skin:  Warm and dry, no erythema, no ulcerations.   Data Reviewed: I have personally reviewed following labs and imaging studies.  CBC: Recent Labs  Lab 02/11/20 0246 02/11/20 0246 02/12/20 1407 02/12/20 1407 02/13/20 0337 02/14/20 0830 02/15/20 0217 02/16/20 0309 02/17/20 0414  WBC 7.3   < > 10.4   < > 7.2 25.0* 11.1* 7.3 5.3  NEUTROABS 6.5  --  9.0*  --  5.6  --   --   --   --   HGB 7.3*   < > 7.7*   < > 7.1* 8.6* 7.5* 7.7* 7.5*  HCT 25.6*   < > 27.0*   < > 25.7* 30.0* 25.7* 26.5* 25.9*  MCV 96.6   < > 99.3   < > 100.8* 97.1 94.8 95.0 95.9  PLT 153   < > 182   < > 146* 208 146* 163 165   < > = values in this interval not displayed.   Basic Metabolic Panel: Recent Labs  Lab 02/11/20 0246 02/11/20 0246 02/12/20 8850 02/12/20 2774 02/13/20 0337 02/14/20 0830 02/15/20 0217 02/16/20 0309 02/17/20 0414  NA 134*   < > 138   < > 138 138 139 137 138  K 4.2   < > 3.9   < > 3.7 4.3 4.2 4.5 4.1  CL 103   < > 106   < > 108 104 103 98 101  CO2 21*   < > 22   < > 22 23 26 25 27   GLUCOSE 159*   < > 127*   < > 118* 134* 169* 198* 184*  BUN 28*   < > 25*   < > 25* 24* 30* 46* 48*  CREATININE 1.05*   < > 0.96   < > 1.01*  0.93 1.08* 1.31* 1.24*  CALCIUM 9.0   < > 9.1   < > 8.8* 8.9 8.9 8.5* 8.4*  MG 1.5*  --  1.8  --  1.6*  --   --   --   --   PHOS  --   --  2.0*  --  2.5  --   --   --   --    < > = values in this interval not displayed.   GFR: Estimated Creatinine Clearance: 46.3 mL/min (A) (by C-G formula based on SCr of 1.24 mg/dL (H)). Liver Function Tests: Recent Labs  Lab 02/13/20 0337 02/14/20 0830 02/15/20 0217 02/16/20 0309 02/17/20 0414  AST 19 19 12* 13* 13*  ALT 17 17 13 14 14   ALKPHOS 103 121 97 88 75  BILITOT 0.9 1.6* 1.2 1.1 0.9  PROT 5.8* 6.4* 6.3* 6.1* 6.0*  ALBUMIN 2.2* 2.5* 2.0* 2.1* 2.2*   Coagulation Profile: No results for input(s): INR, PROTIME in the last 168 hours. Sepsis Labs: No results for input(s): PROCALCITON, LATICACIDVEN in the last 168 hours.  Recent Results (from the past 240 hour(s))  Blood Culture (routine x 2)     Status: Abnormal   Collection Time: 02/08/20  4:25 AM   Specimen: BLOOD RIGHT HAND  Result Value Ref Range Status   Specimen Description BLOOD RIGHT HAND  Final   Special Requests   Final    BOTTLES DRAWN AEROBIC AND ANAEROBIC Blood Culture adequate volume   Culture  Setup Time   Final    ANAEROBIC BOTTLE ONLY GRAM POSITIVE COCCI IN CLUSTERS CRITICAL RESULT CALLED TO, READ BACK BY AND VERIFIED WITH: T MEYER PHARMD 02/09/20 2007 JDW    Culture (A)  Final    STAPHYLOCOCCUS SPECIES (COAGULASE NEGATIVE) THE SIGNIFICANCE OF ISOLATING THIS ORGANISM FROM A SINGLE SET OF BLOOD CULTURES WHEN MULTIPLE SETS ARE DRAWN IS UNCERTAIN. PLEASE NOTIFY THE MICROBIOLOGY DEPARTMENT WITHIN ONE WEEK IF SPECIATION AND SENSITIVITIES ARE REQUIRED. Performed at [facility], [place] [address] [place], [place] [address]    Report Status 02/11/2020 FINAL  Final  Blood Culture (routine x 2)     Status: None   Collection Time: 02/08/20  4:40 AM   Specimen: BLOOD RIGHT ARM  Result Value Ref Range Status   Specimen Description BLOOD RIGHT ARM  Final   Special  Requests   Final    BOTTLES DRAWN AEROBIC AND ANAEROBIC Blood Culture adequate volume Performed at [facility], [place] [address] [place], [place] [address]    Culture NO GROWTH 5 DAYS  Final   Report Status 02/13/2020 FINAL  Final  SARS CORONAVIRUS 2 (TAT 6-24 HRS) Nasopharyngeal Nasopharyngeal Swab     Status: None   Collection Time: 02/08/20  6:50 AM   Specimen: Nasopharyngeal Swab  Result Value Ref Range Status   SARS Coronavirus 2 NEGATIVE NEGATIVE Final    Comment: (NOTE) SARS-CoV-2 target nucleic acids are NOT DETECTED. The SARS-CoV-2 RNA is generally detectable in upper and lower respiratory specimens during the acute phase of infection. Negative results do not preclude SARS-CoV-2 infection, do not rule out co-infections with other pathogens, and should not be used as the sole basis for treatment or other patient management decisions. Negative results must be combined with clinical observations, patient history, and epidemiological information. The expected result is Negative. Fact Sheet for Patients: SugarRoll.be Fact Sheet for Healthcare Providers: https://www.woods-mathews.com/ This test is not yet approved or cleared by the [place] FDA and  has been authorized for detection and/or diagnosis of SARS-CoV-2 by FDA under an Emergency Use Authorization (EUA). This EUA will remain  in effect (meaning this test can be used) for the duration of the COVID-19 declaration under Section 56 4(b)(1) of the Act, 21 U.S.C. section 360bbb-3(b)(1), unless the authorization is terminated or revoked sooner.  Performed at [facility], [place] [address] [place], [place] [address]       Radiology Studies: DG CHEST PORT 1 VIEW  Result Date: 02/17/2020 CLINICAL DATA:  Shortness of breath EXAM: PORTABLE CHEST 1 VIEW COMPARISON:  02/14/2020 FINDINGS: Bilateral mild interstitial thickening. No pleural effusion or pneumothorax. Stable  cardiomegaly. No aggressive osseous lesion. IMPRESSION: Cardiomegaly with mild pulmonary vascular congestion. Electronically Signed   By: Kathreen Devoid   On: 02/17/2020 04:11     Scheduled Meds: . allopurinol  300 mg Oral Daily  . aspirin  81 mg Oral Daily  . brimonidine  1 drop Left Eye BID  . Chlorhexidine Gluconate Cloth  6 each Topical Daily  . clonazepam  0.25 mg Oral BID  . cycloSPORINE  1 drop Both Eyes BID  . dorzolamide  1 drop Both Eyes Daily  . feeding supplement (ENSURE ENLIVE)  237 mL Oral BID BM  . FLUoxetine  40 mg Oral Daily  . furosemide  20 mg Intravenous Q12H  . gabapentin  200 mg Oral TID  . guaiFENesin  600 mg Oral BID  . latanoprost  1 drop Both Eyes QHS  . magnesium oxide  400 mg Oral BID  . methocarbamol  500 mg Oral QID  . mometasone-formoterol  2 puff Inhalation BID  . multivitamin with minerals  1 tablet Oral Daily  . mycophenolate  180 mg Oral BID  . pantoprazole  40 mg Oral BID  . phosphorus  500 mg Oral TID  . predniSONE  20 mg Oral Q breakfast  . saccharomyces boulardii  250 mg Oral BID  . sodium chloride flush  3 mL Intravenous Q12H  . tacrolimus  0.5 mg Oral QHS  . tacrolimus  1 mg Oral Daily  . verapamil  180 mg Oral Daily   Continuous Infusions:    LOS: 9 days   Time spent: 33minutes  Little Ishikawa, DO [facility]  If 7PM-7AM, please contact night-coverage [email or web address] 02/17/2020, 7:12 AM

## 2020-02-17 NOTE — Progress Notes (Signed)
Patient awake and slightly dyspneic.  Bronchodilator given by RN with no change in respirations.  VS stable on [place].  Placed patient on PPV for the duration of the night.  Bipap tolerated well.

## 2020-02-18 LAB — COMPREHENSIVE METABOLIC PANEL
ALT: 17 U/L (ref 0–44)
AST: 18 U/L (ref 15–41)
Albumin: 2.3 g/dL — ABNORMAL LOW (ref 3.5–5.0)
Alkaline Phosphatase: 78 U/L (ref 38–126)
Anion gap: 11 (ref 5–15)
BUN: 51 mg/dL — ABNORMAL HIGH (ref 8–23)
CO2: 27 mmol/L (ref 22–32)
Calcium: 8.8 mg/dL — ABNORMAL LOW (ref 8.9–10.3)
Chloride: 97 mmol/L — ABNORMAL LOW (ref 98–111)
Creatinine, Ser: 1.16 mg/dL — ABNORMAL HIGH (ref 0.44–1.00)
GFR calc Af Amer: 58 mL/min — ABNORMAL LOW (ref >60)
GFR calc non Af Amer: 50 mL/min — ABNORMAL LOW (ref >60)
Glucose, Bld: 166 mg/dL — ABNORMAL HIGH (ref 70–99)
Potassium: 4.6 mmol/L (ref 3.5–5.1)
Sodium: 135 mmol/L (ref 135–145)
Total Bilirubin: 1 mg/dL (ref 0.3–1.2)
Total Protein: 5.7 g/dL — ABNORMAL LOW (ref 6.5–8.1)

## 2020-02-18 LAB — CBC
HCT: 27.6 % — ABNORMAL LOW (ref 36.0–46.0)
Hemoglobin: 7.8 g/dL — ABNORMAL LOW (ref 12.0–15.0)
MCH: 27.7 pg (ref 26.0–34.0)
MCHC: 28.3 g/dL — ABNORMAL LOW (ref 30.0–36.0)
MCV: 97.9 fL (ref 80.0–100.0)
Platelets: 178 10*3/uL (ref 150–400)
RBC: 2.82 MIL/uL — ABNORMAL LOW (ref 3.87–5.11)
RDW: 20.6 % — ABNORMAL HIGH (ref 11.5–15.5)
WBC: 5.7 10*3/uL (ref 4.0–10.5)
nRBC: 0.4 % — ABNORMAL HIGH (ref 0.0–0.2)

## 2020-02-18 NOTE — Plan of Care (Signed)

## 2020-02-18 NOTE — Progress Notes (Signed)
PROGRESS NOTE    April [name]  IEP:[id number] DOB: [birth year] DOA: 02/08/2020 PCP: [name], MD   Brief Narrative:   April [name] a 64 y.o.femalewith medical history significant ofPCKD s/p renal transplant; HLD; anal condyloma; and CHF presenting with SOB/nausea. She was previously hospitalized from 3/10-16 for sepsis of uncertain etiology in a transplant recipient - possibly from mucopurulent bronchitis. She had acute respiratory failure with hypoxia. ID was involved and she was treated with broad spectrum antibiotics and transitioned to IV ceftriaxone. She was recommended to go to CIR but declined and went home with home health services. She felt good when she went home - except that she was having horrible burning diarrhea "from the antibiotics." That got worse and worse and she couldn't tolerate it anymore. She didn't have as much help at home to help her clean up. She has been coughing and dry heaving. Her last BM was yesterday. Overnight, she started having a hard time breathing and couldn't quit dry heaving. She had recurrent fever at home up to 100.4. She is no longer still having cough. No urinary symptoms. She fell in January - hip and pelvic fractures as well as wrist fracture. She went to CIR but was in too much pain to be successful. She is now bed bound. Every time she starts to get more mobile, she has a setback or illness. They recommended return to CIR after last hospitalization. She declined because she didn't have the strength to keep up with the program. She refused a SNF rehab due to her immunosuppression. She continues to feel the same about disposition.  02/13/20: Intention was for discharge today. She felt well enough in the AM, but as the afternoon progressed; she felt more short of breath. Will ^MWT and document O2 needs for discharge. V/Q scan is negative. We will also get her a unit of blood (? Symptomatic anemia). Reassess for d/c in AM.    02/14/20: Worsening respiratory status, volume overload with poor urine output over the past 24 hours.  We will continue aggressive diuresis, BiPAP for respiratory support chest x-ray shows possible bibasilar effusion -increase steroids to cover asthma/COPD flare.  02/15/20: Patient's mental status is markedly improved this morning, patient also diuresed quite well over the past 24 hours, now appears less volume overloaded, not yet euvolemic but markedly improving.  Remains confused about yesterday, indicates she does not recall meeting me for any the episodes or incidents of the previous day.  Patient now awake alert oriented x3, feels quite well but somewhat fatigued and weak.  3/26-28: Appears back to baseline, requesting discharge home however we discussed she is still likely too weak to be discharged home despite support at home and home health.  Defer to PT, appears the patient is being evaluated by CIR as well but unwilling to go back to CIR as she does not feel she is able to keep up with physical therapy there now that she is so weak.   Assessment & Plan:   Principal Problem:   Sepsis ([place]) Active Problems:   Renal transplant recipient   Chronic diastolic CHF (congestive heart failure) (HCC)   Physical deconditioning   Essential hypertension   Glaucoma   SVT (supraventricular tachycardia) (HCC)   Anemia of chronic disease   Palliative care encounter   Goals of care, counseling/discussion  SIRS without source in an immunocompromised patient, uncertain etiology, POA, resolved  - Patient with recent hospitalization, discharged on 3/16 after sepsis from uncertain source - She  returned with fever, tachycardia, tachypnea concerning for recurrent sepsis - She did develop severe diarrhea with Augmentin but this appears to have abated previously - Resumed stress dose steroids 3/24; abx have been stopped, she remains afebrile at this time - Bld Cx NTD - ID onboard,  appreciate assistance - 3/20: No fevers ON. 1/4 vials with growth but c/w contamination - decrease steroids - 3/21: continue to wean steroids - 3/23: taper steroids     - 3/24: reinitiate high dose steroids given respiratory decline - hold off on abx, remains afebrile - without acute complaint for infection other than shortness of breath.     -3/25: Patient more awake alert oriented, white count decreased nearly normal, afebrile, more euvolemic and mental status improving, continue supportive care     -3/26-28: Continue to wean steroids/lasix to home doses- prepare for DC once safe dispo can be obtained  Acute hypoxic respiratory failure likely in setting of heart failure exacerbation and volume overload, resolved     -Patient diuresing extremely well on Lasix, continue to follow I's and O's, not yet euvolemic but markedly improving from previous.     - ABG previously performed unremarkable, VQ scan negative     -Continue to wean oxygen as tolerated, on room air intermittently over the past 24 hours, placed on BiPAP overnight for transient hypoxia, likely sleep apnea given discussion with patient admits to history of snoring with possible pauses in breathing in the outpatient setting but no formal diagnosis  Acute metabolic encephalopathy secondary to above, resolved     -Patient's mental status now back to baseline, ABG unremarkable, BiPAP removed on 2 L nasal cannula currently maintaining sats well     -Continue to follow clinically, supportive care as indicated  PCKD s/p renal transplantation - continue immunosuppressants including mycophenolate and tacrolimus - continue stress dose steroids; will need to taper - 3/20: decrease steroids - 3/23: continue to wean steroids     - 3/24: restart methylprednisolone given respiratory status as above - tapering down now  HTN, essential - 3/21: d/c metoprolol and start verapamil - well controlled  currently  Glaucoma/Dry eye - continue home drops  Debility, ambulator dysfunction - Since her fall in January, she has been essentially bed bound - She is quite deconditioned and this is unlikely to improve her long-term outcomes - PT/OT: HHPT/OT - Pt is encouraged to get OOB to chair at least q shift - palliative care consultation - 3/22: PT/OT re-eval'd and rec'd CIR. Will speak with CM about this.     - 3/23: pt does not want CIR; wants to go home with HHPT/OT  Asthma, not in acute exacerbation - continue dulera, mucinex     -Appears to be well controlled  Gout - Allopurinol  Chronic spasm/pain - robaxin, gabapentin  Anxiety/depression - prozac  GERD - protonix  Macrocytic anemia Thrombocytopenia - hold lovenox - no evidence of bleed - check hemoccult - 3/23: Hgb is 7.1; question symptomatic anemia, transfuse 1 unit pRBCs     - Hgb improved to 8.6     - Plt WNL  SVT, transient episode - HR up to 190s and unresponsive to metoprolol at admission - given 2 rounds of adenosine; converted, started on verapamil - HR and BP is stable now -Currently remains in sinus rhythm, follow clinically, continues on telemetry   DVT prophylaxis: SCDs Code Status: FULL Family Communication: Patient to update family  Disposition Plan: Continues as inpatient, ultimate disposition likely SNF  although she is requesting  discharge home with home-health (I do not feel this is an appropriate discharge location at this time)  Consultants:   ID  PC  Subjective: Patient transiently hypoxic overnight, placed back on BiPAP given concern for sleep apnea but no formal diagnosis.  Patient declines any overt dyspnea, shortness of breath, nausea, vomiting, diarrhea, constipation, headache, fevers, chills..  Objective: Vitals:   02/17/20 2027 02/17/20 2030 02/17/20 2327 02/18/20 0315  BP:   107/61  140/79  Pulse: 80  75 71  Resp: (!) 22 20    Temp:   97.9 F (36.6 C) 98 F (36.7 C)  TempSrc:   Oral Oral  SpO2: 91% 93%  100%  Weight:      Height:        Intake/Output Summary (Last 24 hours) at 02/18/2020 0725 Last data filed at 02/17/2020 2328 Gross per 24 hour  Intake 240 ml  Output 2425 ml  Net -2185 ml   Filed Weights   02/08/20 0728 02/10/20 0613  Weight: 75.5 kg 80.6 kg    Examination:  General: Resting comfortably in bed, awake alert oriented x3, no acute distress HEENT:  Normocephalic atraumatic.  Sclerae nonicteric, noninjected.  Extraocular movements intact bilaterally. Neck:  Without mass or deformity.  Trachea is midline. Lungs: Without overt wheezes rales or rhonchi Heart:  Regular rate and rhythm.  Without murmurs, rubs, or gallops. Abdomen:  Soft, nontender, nondistended.  Without guarding or rebound. Extremities: 1+ pitting edema in both upper and lower extremities bilaterally Vascular:  Dorsalis pedis and posterior tibial pulses palpable bilaterally. Skin:  Warm and dry, no erythema, no ulcerations.   Data Reviewed: I have personally reviewed following labs and imaging studies.  CBC: Recent Labs  Lab 02/12/20 1407 02/12/20 1407 02/13/20 0337 02/13/20 0337 02/14/20 0830 02/15/20 0217 02/16/20 0309 02/17/20 0414 02/18/20 0202  WBC 10.4   < > 7.2   < > 25.0* 11.1* 7.3 5.3 5.7  NEUTROABS 9.0*  --  5.6  --   --   --   --   --   --   HGB 7.7*   < > 7.1*   < > 8.6* 7.5* 7.7* 7.5* 7.8*  HCT 27.0*   < > 25.7*   < > 30.0* 25.7* 26.5* 25.9* 27.6*  MCV 99.3   < > 100.8*   < > 97.1 94.8 95.0 95.9 97.9  PLT 182   < > 146*   < > 208 146* 163 165 178   < > = values in this interval not displayed.   Basic Metabolic Panel: Recent Labs  Lab 02/12/20 0605 02/12/20 3267 02/13/20 0337 02/13/20 0337 02/14/20 0830 02/15/20 0217 02/16/20 0309 02/17/20 0414 02/18/20 0202  NA 138   < > 138   < > 138 139 137 138 135  K 3.9   < > 3.7   < > 4.3 4.2 4.5 4.1  4.6  CL 106   < > 108   < > 104 103 98 101 97*  CO2 22   < > 22   < > 23 26 25 27 27   GLUCOSE 127*   < > 118*   < > 134* 169* 198* 184* 166*  BUN 25*   < > 25*   < > 24* 30* 46* 48* 51*  CREATININE 0.96   < > 1.01*   < > 0.93 1.08* 1.31* 1.24* 1.16*  CALCIUM 9.1   < > 8.8*   < > 8.9 8.9 8.5* 8.4* 8.8*  MG  1.8  --  1.6*  --   --   --   --   --   --   PHOS 2.0*  --  2.5  --   --   --   --   --   --    < > = values in this interval not displayed.   GFR: Estimated Creatinine Clearance: 49.5 mL/min (A) (by C-G formula based on SCr of 1.16 mg/dL (H)). Liver Function Tests: Recent Labs  Lab 02/14/20 0830 02/15/20 0217 02/16/20 0309 02/17/20 0414 02/18/20 0202  AST 19 12* 13* 13* 18  ALT 17 13 14 14 17   ALKPHOS 121 97 88 75 78  BILITOT 1.6* 1.2 1.1 0.9 1.0  PROT 6.4* 6.3* 6.1* 6.0* 5.7*  ALBUMIN 2.5* 2.0* 2.1* 2.2* 2.3*   Coagulation Profile: No results for input(s): INR, PROTIME in the last 168 hours. Sepsis Labs: No results for input(s): PROCALCITON, LATICACIDVEN in the last 168 hours.  No results found for this or any previous visit (from the past 240 hour(s)).    Radiology Studies: DG CHEST PORT 1 VIEW  Result Date: 02/17/2020 CLINICAL DATA:  Shortness of breath EXAM: PORTABLE CHEST 1 VIEW COMPARISON:  02/14/2020 FINDINGS: Bilateral mild interstitial thickening. No pleural effusion or pneumothorax. Stable cardiomegaly. No aggressive osseous lesion. IMPRESSION: Cardiomegaly with mild pulmonary vascular congestion. Electronically Signed   By: Kathreen Devoid   On: 02/17/2020 04:11     Scheduled Meds: . allopurinol  300 mg Oral Daily  . aspirin  81 mg Oral Daily  . brimonidine  1 drop Left Eye BID  . Chlorhexidine Gluconate Cloth  6 each Topical Daily  . clonazepam  0.25 mg Oral BID  . cycloSPORINE  1 drop Both Eyes BID  . dorzolamide  1 drop Both Eyes Daily  . feeding supplement (ENSURE ENLIVE)  237 mL Oral BID BM  . FLUoxetine  40 mg Oral Daily  . furosemide  20 mg  Intravenous Q12H  . gabapentin  200 mg Oral TID  . guaiFENesin  600 mg Oral BID  . latanoprost  1 drop Both Eyes QHS  . magnesium oxide  400 mg Oral BID  . methocarbamol  500 mg Oral QID  . mometasone-formoterol  2 puff Inhalation BID  . multivitamin with minerals  1 tablet Oral Daily  . mycophenolate  180 mg Oral BID  . pantoprazole  40 mg Oral BID  . phosphorus  500 mg Oral TID  . predniSONE  20 mg Oral Q breakfast  . saccharomyces boulardii  250 mg Oral BID  . sodium chloride flush  3 mL Intravenous Q12H  . tacrolimus  0.5 mg Oral QHS  . tacrolimus  1 mg Oral Daily  . verapamil  180 mg Oral Daily   Continuous Infusions:    LOS: 10 days   Time spent: 74minutes  Little Ishikawa, DO [facility]  If 7PM-7AM, please contact night-coverage [email or web address] 02/18/2020, 7:25 AM

## 2020-02-19 LAB — COMPREHENSIVE METABOLIC PANEL
ALT: 18 U/L (ref 0–44)
AST: 16 U/L (ref 15–41)
Albumin: 2.3 g/dL — ABNORMAL LOW (ref 3.5–5.0)
Alkaline Phosphatase: 71 U/L (ref 38–126)
Anion gap: 10 (ref 5–15)
BUN: 47 mg/dL — ABNORMAL HIGH (ref 8–23)
CO2: 32 mmol/L (ref 22–32)
Calcium: 9 mg/dL (ref 8.9–10.3)
Chloride: 100 mmol/L (ref 98–111)
Creatinine, Ser: 1.04 mg/dL — ABNORMAL HIGH (ref 0.44–1.00)
GFR calc Af Amer: >60 mL/min (ref >60)
GFR calc non Af Amer: 58 mL/min — ABNORMAL LOW (ref >60)
Glucose, Bld: 172 mg/dL — ABNORMAL HIGH (ref 70–99)
Potassium: 5.3 mmol/L — ABNORMAL HIGH (ref 3.5–5.1)
Sodium: 142 mmol/L (ref 135–145)
Total Bilirubin: 1.2 mg/dL (ref 0.3–1.2)
Total Protein: 5.6 g/dL — ABNORMAL LOW (ref 6.5–8.1)

## 2020-02-19 LAB — CBC
HCT: 27.7 % — ABNORMAL LOW (ref 36.0–46.0)
Hemoglobin: 7.9 g/dL — ABNORMAL LOW (ref 12.0–15.0)
MCH: 27.6 pg (ref 26.0–34.0)
MCHC: 28.5 g/dL — ABNORMAL LOW (ref 30.0–36.0)
MCV: 96.9 fL (ref 80.0–100.0)
Platelets: 143 10*3/uL — ABNORMAL LOW (ref 150–400)
RBC: 2.86 MIL/uL — ABNORMAL LOW (ref 3.87–5.11)
RDW: 20.4 % — ABNORMAL HIGH (ref 11.5–15.5)
WBC: 5.9 10*3/uL (ref 4.0–10.5)
nRBC: 0 % (ref 0.0–0.2)

## 2020-02-19 MED ORDER — FUROSEMIDE 40 MG PO TABS
40.0000 mg | ORAL_TABLET | Freq: Every day | ORAL | Status: DC
  Administered 2020-02-19 – 2020-02-23 (×5): 40 mg via ORAL
  Filled 2020-02-19 (×5): qty 1

## 2020-02-19 NOTE — Progress Notes (Signed)
PROGRESS NOTE    April [name]  BPZ:[id number] DOB: 1956-01-31 DOA: 02/08/2020 PCP: [name], MD   Brief Narrative:   April [name] a 65 y.o.femalewith medical history significant ofPCKD s/p renal transplant; HLD; anal condyloma; and CHF presenting with SOB/nausea. She was previously hospitalized from 3/10-16 for sepsis of uncertain etiology in a transplant recipient - possibly from mucopurulent bronchitis. She had acute respiratory failure with hypoxia. ID was involved and she was treated with broad spectrum antibiotics and transitioned to IV ceftriaxone. She was recommended to go to CIR but declined and went home with home health services. She felt good when she went home - except that she was having horrible burning diarrhea "from the antibiotics." That got worse and worse and she couldn't tolerate it anymore. She didn't have as much help at home to help her clean up. She has been coughing and dry heaving. Her last BM was yesterday. Overnight, she started having a hard time breathing and couldn't quit dry heaving. She had recurrent fever at home up to 100.4. She is no longer still having cough. No urinary symptoms. She fell in January - hip and pelvic fractures as well as wrist fracture. She went to CIR but was in too much pain to be successful. She is now bed bound. Every time she starts to get more mobile, she has a setback or illness. They recommended return to CIR after last hospitalization. She declined because she didn't have the strength to keep up with the program. She refused a SNF rehab due to her immunosuppression. She continues to feel the same about disposition.  02/13/20: Intention was for discharge, she felt well enough in the AM, but as the afternoon progressed; she felt more short of breath. Will ^MWT and document O2 needs for discharge. V/Q scan is negative. We will also get her a unit of blood (? Symptomatic anemia). Reassess for d/c in AM.    02/14/20: Worsening respiratory status, volume overload with poor urine output over the past 24 hours.  We will continue aggressive diuresis, BiPAP for respiratory support chest x-ray shows possible bibasilar effusion -increase steroids to cover asthma/COPD flare.  02/15/20: Patient's mental status is markedly improved this morning, patient also diuresed quite well over the past 24 hours, now appears less volume overloaded, not yet euvolemic but markedly improving.  Remains confused about yesterday, indicates she does not recall meeting me for any the episodes or incidents of the previous day.  Patient now awake alert oriented x3, feels quite well but somewhat fatigued and weak.  3/26-29: Appears back to baseline, requesting discharge home however we discussed she is still likely too weak to be discharged home despite support at home and home health.  Defer to PT, appears the patient is being evaluated by CIR as well but unwilling to go back to CIR as she does not feel she is able to keep up with physical therapy there now that she is so weak.   Assessment & Plan:   Principal Problem:   Sepsis ([place]) Active Problems:   Renal transplant recipient   Chronic diastolic CHF (congestive heart failure) (HCC)   Physical deconditioning   Essential hypertension   Glaucoma   SVT (supraventricular tachycardia) (HCC)   Anemia of chronic disease   Palliative care encounter   Goals of care, counseling/discussion   SIRS without source in an immunocompromised patient, uncertain etiology, POA, resolved  - Patient with recent hospitalization, discharged on 3/16 after sepsis from uncertain source - She  returned with fever, tachycardia, tachypnea concerning for recurrent sepsis - She did develop severe diarrhea with Augmentin but this appears to have abated previously - Resumed stress dose steroids 3/24; abx have been stopped, she remains afebrile at this time - Bld Cx NTD - ID  onboard, appreciate assistance - 3/20: No fevers ON. 1/4 vials with growth but c/w contamination - decrease steroids - 3/21: continue to wean steroids - 3/23: taper steroids     - 3/24: reinitiate high dose steroids given respiratory decline - hold off on abx, remains afebrile - without acute complaint for infection other than shortness of breath.     -3/25: Patient more awake alert oriented, white count decreased nearly normal, afebrile, more euvolemic and mental status improving, continue supportive care     -3/26-28: Continue to wean steroids/lasix to home doses- prepare for DC once safe dispo can be obtained (likely SNF)     -3/29 - back on home lasix - likely need prolonged steroid taper at DC, awaiting SNF placement at this point  Acute hypoxic respiratory failure likely in setting of heart failure exacerbation and volume overload, resolved     -Patient diuresing extremely well on Lasix, continue to follow I's and O's, not yet euvolemic but markedly improving from previous.     - ABG previously performed unremarkable, VQ scan negative     -Continue to wean oxygen as tolerated, on room air intermittently over the past 24 hours, placed on BiPAP previously overnight for transient hypoxia, likely sleep apnea given discussion with patient admits to history of snoring with possible pauses in breathing in the outpatient setting but no formal diagnosis  Acute metabolic encephalopathy secondary to above, resolved     -Patient's mental status now back to baseline, ABG unremarkable, BiPAP removed on 2 L nasal cannula currently maintaining sats well     -Continue to follow clinically, supportive care as indicated  PCKD s/p renal transplantation - continue immunosuppressants including mycophenolate and tacrolimus - continue stress dose steroids; will need to taper - 3/20: decrease steroids - 3/23: continue to wean steroids     - 3/24: restart methylprednisolone given  respiratory status as above - tapering down now  HTN, essential - 3/21: d/c metoprolol and start verapamil - well controlled currently  Glaucoma/Dry eye - continue home drops  Debility, ambulator dysfunction - Since her fall in January, she has been essentially bed bound - She is quite deconditioned and this is unlikely to improve her long-term outcomes - PT/OT: HHPT/OT - Pt is encouraged to get OOB to chair at least q shift - palliative care consultation - 3/22: PT/OT re-eval'd and rec'd CIR. Will speak with CM about this.     - 3/23: pt does not want CIR; wants to go home with HHPT/OT  Asthma, not in acute exacerbation - continue dulera, mucinex     -Appears to be well controlled  Gout - Allopurinol  Chronic spasm/pain - robaxin, gabapentin  Anxiety/depression - prozac  GERD - protonix  Macrocytic anemia Thrombocytopenia - hold lovenox - no evidence of bleed - check hemoccult - 3/23: Hgb is 7.1; question symptomatic anemia, transfuse 1 unit pRBCs     - Hgb improved to 8.6     - Plt WNL  SVT, transient episode - HR up to 190s and unresponsive to metoprolol at admission - given 2 rounds of adenosine; converted, started on verapamil - HR and BP is stable now -Currently remains in sinus rhythm, follow clinically, continues on telemetry  DVT prophylaxis: SCDs Code Status: FULL Family Communication: Patient to update family  Disposition Plan: Continues as inpatient, ultimate disposition likely SNF - discussed with CIR but patient refusing admission there as she feels like she is too weak to handle their PT regimen. Awaiting insurance approval and bed authorization for SNF placement -CM aware.  Consultants:   ID  PC  Subjective: No acute issues or events overnight, patient feels quite well, concerned about discharge as she feels she is now too weak to even consider  discharging home, we discussed we are working on placement at [facility] as above, otherwise patient declines nausea, vomiting, diarrhea, constipation, headache, fevers, chills.  Objective: Vitals:   02/18/20 0855 02/18/20 1642 02/18/20 1946 02/19/20 0003  BP: 136/66 (!) 112/51  132/72  Pulse: 81 78  78  Resp: (!) 22 20    Temp: 98.2 F (36.8 C) 97.6 F (36.4 C)  97.8 F (36.6 C)  TempSrc:    Oral  SpO2: 100% 100% 99% 100%  Weight:      Height:        Intake/Output Summary (Last 24 hours) at 02/19/2020 0723 Last data filed at 02/19/2020 0641 Gross per 24 hour  Intake 360 ml  Output 4000 ml  Net -3640 ml   Filed Weights   02/08/20 0728 02/10/20 0613  Weight: 75.5 kg 80.6 kg    Examination:  General: Resting comfortably in bed, awake alert oriented x3, no acute distress HEENT:  Normocephalic atraumatic.  Sclerae nonicteric, noninjected.  Extraocular movements intact bilaterally. Neck:  Without mass or deformity.  Trachea is midline. Lungs: Without overt wheezes rales or rhonchi Heart:  Regular rate and rhythm.  Without murmurs, rubs, or gallops. Abdomen:  Soft, nontender, nondistended.  Without guarding or rebound. Extremities: 1+ pitting edema in both upper and lower extremities bilaterally Vascular:  Dorsalis pedis and posterior tibial pulses palpable bilaterally. Skin:  Warm and dry, no erythema, no ulcerations.   Data Reviewed: I have personally reviewed following labs and imaging studies.  CBC: Recent Labs  Lab 02/12/20 1407 02/12/20 1407 02/13/20 0337 02/14/20 0830 02/15/20 0217 02/16/20 0309 02/17/20 0414 02/18/20 0202 02/19/20 0301  WBC 10.4   < > 7.2   < > 11.1* 7.3 5.3 5.7 5.9  NEUTROABS 9.0*  --  5.6  --   --   --   --   --   --   HGB 7.7*   < > 7.1*   < > 7.5* 7.7* 7.5* 7.8* 7.9*  HCT 27.0*   < > 25.7*   < > 25.7* 26.5* 25.9* 27.6* 27.7*  MCV 99.3   < > 100.8*   < > 94.8 95.0 95.9 97.9 96.9  PLT 182   < > 146*   < > 146* 163 165 178 143*   < > = values  in this interval not displayed.   Basic Metabolic Panel: Recent Labs  Lab 02/13/20 0337 02/14/20 0830 02/15/20 0217 02/16/20 0309 02/17/20 0414 02/18/20 0202 02/19/20 0301  NA 138   < > 139 137 138 135 142  K 3.7   < > 4.2 4.5 4.1 4.6 5.3*  CL 108   < > 103 98 101 97* 100  CO2 22   < > 26 25 27 27  32  GLUCOSE 118*   < > 169* 198* 184* 166* 172*  BUN 25*   < > 30* 46* 48* 51* 47*  CREATININE 1.01*   < > 1.08* 1.31* 1.24* 1.16* 1.04*  CALCIUM  8.8*   < > 8.9 8.5* 8.4* 8.8* 9.0  MG 1.6*  --   --   --   --   --   --   PHOS 2.5  --   --   --   --   --   --    < > = values in this interval not displayed.   GFR: Estimated Creatinine Clearance: 55.2 mL/min (A) (by C-G formula based on SCr of 1.04 mg/dL (H)). Liver Function Tests: Recent Labs  Lab 02/15/20 0217 02/16/20 0309 02/17/20 0414 02/18/20 0202 02/19/20 0301  AST 12* 13* 13* 18 16  ALT 13 14 14 17 18   ALKPHOS 97 88 75 78 71  BILITOT 1.2 1.1 0.9 1.0 1.2  PROT 6.3* 6.1* 6.0* 5.7* 5.6*  ALBUMIN 2.0* 2.1* 2.2* 2.3* 2.3*   Coagulation Profile: No results for input(s): INR, PROTIME in the last 168 hours. Sepsis Labs: No results for input(s): PROCALCITON, LATICACIDVEN in the last 168 hours.  No results found for this or any previous visit (from the past 240 hour(s)).    Radiology Studies: No results found.   Scheduled Meds: . allopurinol  300 mg Oral Daily  . aspirin  81 mg Oral Daily  . brimonidine  1 drop Left Eye BID  . Chlorhexidine Gluconate Cloth  6 each Topical Daily  . clonazepam  0.25 mg Oral BID  . cycloSPORINE  1 drop Both Eyes BID  . dorzolamide  1 drop Both Eyes Daily  . feeding supplement (ENSURE ENLIVE)  237 mL Oral BID BM  . FLUoxetine  40 mg Oral Daily  . furosemide  20 mg Intravenous Q12H  . gabapentin  200 mg Oral TID  . guaiFENesin  600 mg Oral BID  . latanoprost  1 drop Both Eyes QHS  . magnesium oxide  400 mg Oral BID  . methocarbamol  500 mg Oral QID  . mometasone-formoterol  2 puff  Inhalation BID  . multivitamin with minerals  1 tablet Oral Daily  . mycophenolate  180 mg Oral BID  . pantoprazole  40 mg Oral BID  . phosphorus  500 mg Oral TID  . predniSONE  20 mg Oral Q breakfast  . saccharomyces boulardii  250 mg Oral BID  . sodium chloride flush  3 mL Intravenous Q12H  . tacrolimus  0.5 mg Oral QHS  . tacrolimus  1 mg Oral Daily  . verapamil  180 mg Oral Daily   Continuous Infusions:    LOS: 11 days   Time spent: 84minutes  Little Ishikawa, DO [facility]  If 7PM-7AM, please contact night-coverage [email or web address] 02/19/2020, 7:23 AM

## 2020-02-19 NOTE — Progress Notes (Signed)
Physical Therapy Treatment Patient Details Name: April [name] MRN: [id number] DOB: Apr 06, 1956 Today's Date: 02/19/2020    History of Present Illness Pt is a 64 y.o. female with PMH significant of PCKD s/p renal transplant; HLD; anal condyloma; CHF presenting with SOB/nausea.  She was previously hospitalized from 3/10-16 for sepsis of uncertain etiology in a transplant recipient - possibly from mucopurulent bronchitis.  She had acute respiratory failure with hypoxia. She was recommended to go to CIR but declined and went home with [facility].  Pt fell in January with subsequent L hip and wrist fxs. Pt started experiencing SOB at home and a fever.    PT Comments    Pt admitted with above diagnosis. Pt was able to sit EOB 9 min and perform exercises.  Stood with mod to max assist but could not maintain.  Pt currently with functional limitations due to balance and endurance deficits. Pt will benefit from skilled PT to increase their independence and safety with mobility to allow discharge to the venue listed below.     Follow Up Recommendations  CIR     Equipment Recommendations  None recommended by PT    Recommendations for Other Services       Precautions / Restrictions Precautions Precautions: Fall Precaution Comments: L wrist brace for comfort, exercise as tolerated Restrictions Weight Bearing Restrictions: No LUE Weight Bearing: Weight bearing as tolerated LLE Weight Bearing: Weight bearing as tolerated    Mobility  Bed Mobility Overal bed mobility: Needs Assistance Bed Mobility: Supine to Sit;Sit to Supine     Supine to sit: Mod assist;HOB elevated Sit to supine: Mod assist;+2 for physical assistance;HOB elevated   General bed mobility comments: pt crosses R foot underneath L foot to help with sliding her feet out- difficulty initiating and signifcant weakness requiring mod A x 2 for LEs and trunk  Transfers Overall transfer level: Needs assistance Equipment used: Rolling  walker (2 wheeled) Transfers: Sit to/from Stand Sit to Stand: Mod assist;+2 physical assistance;Max assist         General transfer comment: Pt stood to RW but needed max assist progressing to mod assist with pt stanidng partially up and then needing to sit back down. On second attempt to stand, pt could not stand and even slid to edge of bed needing assist to get back on bed.  Poor endurance and could not stand again tehrefore laid pt back down.   Ambulation/Gait             General Gait Details: unable   Stairs             Wheelchair Mobility    Modified Rankin (Stroke Patients Only)       Balance Overall balance assessment: Needs assistance Sitting-balance support: Feet supported;Bilateral upper extremity supported Sitting balance-Leahy Scale: Poor Sitting balance - Comments: Reliance on UE support/ handheld assist to maintain balance. Sat EOB 9 min.  Seated UE and LE exer isees Postural control: Posterior lean Standing balance support: During functional activity;Bilateral upper extremity supported Standing balance-Leahy Scale: Poor Standing balance comment: Stood for less than 5 seconds and unable to stand fully upright.                             Cognition Arousal/Alertness: Awake/alert Behavior During Therapy: WFL for tasks assessed/performed Overall Cognitive Status: Within Functional Limits for tasks assessed  General Comments: Pt is anxious due to SOB- takes short, shallow breaths      Exercises General Exercises - Upper Extremity Shoulder Flexion: Strengthening;10 reps;Seated;Both Shoulder Horizontal ABduction: Strengthening;10 reps;Seated General Exercises - Lower Extremity Long Arc Quad: AROM;15 reps;Both;Seated Other Exercises Other Exercises: pursed lip breathing    General Comments General comments (skin integrity, edema, etc.): Pt is on O2 with VSS      Pertinent Vitals/Pain  Pain Assessment: Faces Faces Pain Scale: No hurt    Home Living                      Prior Function            PT Goals (current goals can now be found in the care plan section) Acute Rehab PT Goals Patient Stated Goal: to be able to breathe Progress towards PT goals: Progressing toward goals    Frequency    Min 3X/week      PT Plan Current plan remains appropriate    Co-evaluation              AM-PAC PT "6 Clicks" Mobility   Outcome Measure  Help needed turning from your back to your side while in a flat bed without using bedrails?: A Little Help needed moving from lying on your back to sitting on the side of a flat bed without using bedrails?: A Lot Help needed moving to and from a bed to a chair (including a wheelchair)?: A Lot Help needed standing up from a chair using your arms (e.g., wheelchair or bedside chair)?: A Lot Help needed to walk in hospital room?: Total Help needed climbing 3-5 steps with a railing? : Total 6 Click Score: 11    End of Session Equipment Utilized During Treatment: Oxygen;Gait belt Activity Tolerance: Patient limited by fatigue;Other (comment)(limited by SOB/ anxiety) Patient left: in bed;with call bell/phone within reach;with bed alarm set Nurse Communication: Mobility status PT Visit Diagnosis: Muscle weakness (generalized) (M62.81);History of falling (Z91.81);Difficulty in walking, not elsewhere classified (R26.2)     Time: 0223-3612 PT Time Calculation (min) (ACUTE ONLY): 23 min  Charges:  $Therapeutic Exercise: 8-22 mins $Therapeutic Activity: 8-22 mins                     Pegge Cumberledge W,PT Acute Rehabilitation Services Pager:  [phone]  Office:  [place] 02/19/2020, 1:47 PM

## 2020-02-19 NOTE — Plan of Care (Signed)

## 2020-02-20 MED ORDER — MAGIC MOUTHWASH W/LIDOCAINE
5.0000 mL | Freq: Three times a day (TID) | ORAL | Status: DC | PRN
  Administered 2020-02-20 – 2020-02-21 (×2): 5 mL via ORAL
  Filled 2020-02-20 (×3): qty 5

## 2020-02-20 NOTE — Progress Notes (Signed)
Pt complained of mouth and lip sores which are causing pt pain/discomfort. Mouth care kits at bedside and pt used a CHG swish and stated that it helped. Reached out to attending and dayshift nurse is aware.

## 2020-02-20 NOTE — Progress Notes (Signed)
PROGRESS NOTE    April [name]  MHD:[id number] DOB: Apr 07, 1956 DOA: 02/08/2020 PCP: [name], MD   Brief Narrative:   April [name] a 64 y.o.femalewith medical history significant ofPCKD s/p renal transplant; HLD; anal condyloma; and CHF presenting with SOB/nausea. She was previously hospitalized from 3/10-16 for sepsis of uncertain etiology in a transplant recipient - possibly from mucopurulent bronchitis. She had acute respiratory failure with hypoxia. ID was involved and she was treated with broad spectrum antibiotics and transitioned to IV ceftriaxone. She was recommended to go to CIR but declined and went home with home health services. She felt good when she went home - except that she was having horrible burning diarrhea "from the antibiotics." That got worse and worse and she couldn't tolerate it anymore. She didn't have as much help at home to help her clean up. She has been coughing and dry heaving. Her last BM was yesterday. Overnight, she started having a hard time breathing and couldn't quit dry heaving. She had recurrent fever at home up to 100.4. She is no longer still having cough. No urinary symptoms. She fell in January - hip and pelvic fractures as well as wrist fracture. She went to CIR but was in too much pain to be successful. She is now bed bound. Every time she starts to get more mobile, she has a setback or illness. They recommended return to CIR after last hospitalization. She declined because she didn't have the strength to keep up with the program. She refused a SNF rehab due to her immunosuppression. She continues to feel the same about disposition.  02/13/20: Intention was for discharge, she felt well enough in the AM, but as the afternoon progressed; she felt more short of breath. Will ^MWT and document O2 needs for discharge. V/Q scan is negative. We will also get her a unit of blood (? Symptomatic anemia). Reassess for d/c in AM.    02/14/20: Worsening respiratory status, volume overload with poor urine output over the past 24 hours.  We will continue aggressive diuresis, BiPAP for respiratory support chest x-ray shows possible bibasilar effusion -increase steroids to cover asthma/COPD flare.  02/15/20: Patient's mental status is markedly improved this morning, patient also diuresed quite well over the past 24 hours, now appears less volume overloaded, not yet euvolemic but markedly improving.  Remains confused about yesterday, indicates she does not recall meeting me for any the episodes or incidents of the previous day.  Patient now awake alert oriented x3, feels quite well but somewhat fatigued and weak.  3/26-30: Appears back to baseline, requesting discharge home however we discussed she is still likely too weak to be discharged home despite support at home and home health.  Defer to PT, appears the patient is being evaluated by CIR as well but unwilling to go back to CIR as she does not feel she is able to keep up with physical therapy there now that she is so weak.  Awaiting SNF placement.   Assessment & Plan:   Principal Problem:   Sepsis ([place]) Active Problems:   Renal transplant recipient   Chronic diastolic CHF (congestive heart failure) (HCC)   Physical deconditioning   Essential hypertension   Glaucoma   SVT (supraventricular tachycardia) (HCC)   Anemia of chronic disease   Palliative care encounter   Goals of care, counseling/discussion   SIRS without source in an immunocompromised patient, uncertain etiology, POA, resolved  - Patient with recent hospitalization, discharged on 3/16 after sepsis from  uncertain source - She returned with fever, tachycardia, tachypnea concerning for recurrent sepsis - She did develop severe diarrhea with Augmentin but this appears to have abated previously - Resumed stress dose steroids 3/24; abx have been stopped, she remains afebrile at this time -  Bld Cx NTD - ID onboard, appreciate assistance - 3/20: No fevers ON. 1/4 vials with growth but c/w contamination - decrease steroids - 3/21: continue to wean steroids - 3/23: taper steroids     - 3/24: reinitiate high dose steroids given respiratory decline - hold off on abx, remains afebrile - without acute complaint for infection other than shortness of breath.     -3/25: Patient more awake alert oriented, white count decreased nearly normal, afebrile, more euvolemic and mental status improving, continue supportive care     -3/26-28: Continue to wean steroids/lasix to home doses- prepare for DC once safe dispo can be obtained (likely SNF)     -3/29 - back on home lasix - likely need prolonged steroid taper at DC, awaiting SNF placement at this point  Acute hypoxic respiratory failure likely in setting of heart failure exacerbation and volume overload, resolving     -Patient diuresing extremely well on Lasix, continue to follow I's and O's, not yet euvolemic but markedly improving from previous.     - ABG previously performed unremarkable, VQ scan negative     -Continue to wean oxygen as tolerated, on room air intermittently over the past 24 hours, placed on BiPAP previously overnight for transient hypoxia, likely sleep apnea given discussion with patient admits to history of snoring with possible pauses in breathing in the outpatient setting but no formal diagnosis  Acute metabolic encephalopathy secondary to above, resolved     -Patient's mental status now back to baseline, ABG unremarkable, BiPAP removed on 2 L nasal cannula currently maintaining sats well     -Continue to follow clinically, supportive care as indicated  PCKD s/p renal transplantation - continue immunosuppressants including mycophenolate and tacrolimus - continue stress dose steroids; will need to taper - 3/20: decrease steroids - 3/23: continue to wean steroids     - 3/24: restart  methylprednisolone given respiratory status as above - tapering down now  HTN, essential - 3/21: d/c metoprolol and start verapamil - well controlled currently  Glaucoma/Dry eye - continue home drops  Debility, ambulator dysfunction - Since her fall in January, she has been essentially bed bound - She is quite deconditioned and this is unlikely to improve her long-term outcomes - PT/OT: HHPT/OT - Pt is encouraged to get OOB to chair at least q shift - palliative care consultation - 3/22: PT/OT re-eval'd and rec'd CIR. Will speak with CM about this.     - 3/23: pt does not want CIR; wants to go home with HHPT/OT  Asthma, not in acute exacerbation - continue dulera, mucinex     -Appears to be well controlled  Gout - Allopurinol  Chronic spasm/pain - robaxin, gabapentin  Anxiety/depression - prozac  GERD - protonix  Macrocytic anemia Thrombocytopenia - hold lovenox - no evidence of bleed - check hemoccult - 3/23: Hgb is 7.1; question symptomatic anemia, transfuse 1 unit pRBCs     - Hgb improved to 8.6     - Plt WNL  SVT, transient episode - HR up to 190s and unresponsive to metoprolol at admission - given 2 rounds of adenosine; converted, started on verapamil - HR and BP is stable now -Currently remains in sinus rhythm, follow clinically, continues  on telemetry   DVT prophylaxis: SCDs Code Status: FULL Family Communication: Patient to update family  Disposition Plan: Continues as inpatient, ultimate disposition likely SNF - discussed with CIR but patient refusing admission there as she feels like she is too weak to handle their PT regimen. Awaiting insurance approval and bed authorization for SNF placement -CM aware.  Consultants:   ID  PC  Subjective: No acute issues or events overnight, patient feels quite well, denies chest pain, shortness of breath, nausea,  vomiting, diarrhea, constipation, headache, fevers, chills.  Only concern was about possible abdominal swelling and its possible effect on her kidney transplant, we discussed the edema is not related to her kidney transplant and more her volume status in general and that more moving, diuresing and limiting p.o. intake would be the best thing for her edema.  Objective: Vitals:   02/19/20 0003 02/19/20 0759 02/19/20 2018 02/19/20 2323  BP: 132/72 (!) 141/68  114/62  Pulse: 78 75  79  Resp:  18    Temp: 97.8 F (36.6 C)   98.3 F (36.8 C)  TempSrc: Oral     SpO2: 100% 100% 99% 100%  Weight:      Height:        Intake/Output Summary (Last 24 hours) at 02/20/2020 0726 Last data filed at 02/19/2020 2125 Gross per 24 hour  Intake 463 ml  Output 1200 ml  Net -737 ml   Filed Weights   02/08/20 0728 02/10/20 0613  Weight: 75.5 kg 80.6 kg    Examination:  General: Resting comfortably in bed, awake alert oriented x3, no acute distress HEENT:  Normocephalic atraumatic.  Sclerae nonicteric, noninjected.  Extraocular movements intact bilaterally. Neck:  Without mass or deformity.  Trachea is midline. Lungs: Without overt wheezes rales or rhonchi Heart:  Regular rate and rhythm.  Without murmurs, rubs, or gallops. Abdomen:  Soft, nontender, nondistended.  Without guarding or rebound.  1+ pitting edema posterio-laterally. Extremities: 1+ pitting edema in both upper and lower extremities bilaterally Vascular:  Dorsalis pedis and posterior tibial pulses palpable bilaterally. Skin:  Warm and dry, no erythema, no ulcerations.   Data Reviewed: I have personally reviewed following labs and imaging studies.  CBC: Recent Labs  Lab 02/15/20 0217 02/16/20 0309 02/17/20 0414 02/18/20 0202 02/19/20 0301  WBC 11.1* 7.3 5.3 5.7 5.9  HGB 7.5* 7.7* 7.5* 7.8* 7.9*  HCT 25.7* 26.5* 25.9* 27.6* 27.7*  MCV 94.8 95.0 95.9 97.9 96.9  PLT 146* 163 165 178 527*   Basic Metabolic Panel: Recent Labs   Lab 02/15/20 0217 02/16/20 0309 02/17/20 0414 02/18/20 0202 02/19/20 0301  NA 139 137 138 135 142  K 4.2 4.5 4.1 4.6 5.3*  CL 103 98 101 97* 100  CO2 26 25 27 27  32  GLUCOSE 169* 198* 184* 166* 172*  BUN 30* 46* 48* 51* 47*  CREATININE 1.08* 1.31* 1.24* 1.16* 1.04*  CALCIUM 8.9 8.5* 8.4* 8.8* 9.0   GFR: Estimated Creatinine Clearance: 55.2 mL/min (A) (by C-G formula based on SCr of 1.04 mg/dL (H)). Liver Function Tests: Recent Labs  Lab 02/15/20 0217 02/16/20 0309 02/17/20 0414 02/18/20 0202 02/19/20 0301  AST 12* 13* 13* 18 16  ALT 13 14 14 17 18   ALKPHOS 97 88 75 78 71  BILITOT 1.2 1.1 0.9 1.0 1.2  PROT 6.3* 6.1* 6.0* 5.7* 5.6*  ALBUMIN 2.0* 2.1* 2.2* 2.3* 2.3*   Coagulation Profile: No results for input(s): INR, PROTIME in the last 168 hours. Sepsis Labs: No  results for input(s): PROCALCITON, LATICACIDVEN in the last 168 hours.  No results found for this or any previous visit (from the past 240 hour(s)).    Radiology Studies: No results found.   Scheduled Meds: . allopurinol  300 mg Oral Daily  . aspirin  81 mg Oral Daily  . brimonidine  1 drop Left Eye BID  . Chlorhexidine Gluconate Cloth  6 each Topical Daily  . clonazepam  0.25 mg Oral BID  . cycloSPORINE  1 drop Both Eyes BID  . dorzolamide  1 drop Both Eyes Daily  . feeding supplement (ENSURE ENLIVE)  237 mL Oral BID BM  . FLUoxetine  40 mg Oral Daily  . furosemide  40 mg Oral Daily  . gabapentin  200 mg Oral TID  . guaiFENesin  600 mg Oral BID  . latanoprost  1 drop Both Eyes QHS  . magnesium oxide  400 mg Oral BID  . methocarbamol  500 mg Oral QID  . mometasone-formoterol  2 puff Inhalation BID  . multivitamin with minerals  1 tablet Oral Daily  . mycophenolate  180 mg Oral BID  . pantoprazole  40 mg Oral BID  . phosphorus  500 mg Oral TID  . predniSONE  20 mg Oral Q breakfast  . saccharomyces boulardii  250 mg Oral BID  . sodium chloride flush  3 mL Intravenous Q12H  . tacrolimus  0.5  mg Oral QHS  . tacrolimus  1 mg Oral Daily  . verapamil  180 mg Oral Daily   Continuous Infusions:    LOS: 12 days   Time spent: 75minutes  Little Ishikawa, DO [facility]  If 7PM-7AM, please contact night-coverage [email or web address] 02/20/2020, 7:26 AM

## 2020-02-20 NOTE — TOC Progression Note (Signed)
Transition of Care [facility]) - Progression Note    Patient Details  Name: April [name] MRN: [id number] Date of Birth: 05/09/1956  Transition of [facility]) CM/SW [place], RN Phone Number: 02/20/2020, 10:22 AM  Clinical Narrative:    Per bedside nurse patient is now agreeable to try CIR. CM in to discuss this option with patient and patient states that she does not want to commit to inpatient therapy or any other therapy right now, she just wants to speak with someone from CIR. Patient is not agreeable to any discharge disposition at this time.This Probation officer has notified [name] from [employer] who will come to speak with the patient. Message has been sent to MD to obtain new order to consult CIR. Will continue to follow.    Expected Discharge Plan: Home/Self Care Barriers to Discharge: Continued Medical Work up  Expected Discharge Plan and Services Expected Discharge Plan: Home/Self Care In-house Referral: NA Discharge Planning Services: CM Consult   Living arrangements for the past 2 months: Single Family Home(2 story home but can maintain on the first level)                           HH Arranged: ([place] active prior to admit and will continue upon discharge) [place] Agency: [name] prior to admit)         Social Determinants of Health (SDOH) Interventions    Readmission Risk Interventions Readmission Risk Prevention Plan 02/09/2020 04/20/2019  Transportation Screening Complete Complete  PCP or Specialist Appt within 3-5 Days - Not Complete  Not Complete comments - not yet ready for d/c  HRI or [place] - Not Complete  HRI or Home Care Consult comments - no needs identified  Social Work Consult for [place] Planning/Counseling - Not Complete  SW consult not completed comments - no needs identified  Palliative Care Screening - Not Applicable  Medication Review (RN Care Manager) Referral to Pharmacy Complete  PCP or  Specialist appointment within 3-5 days of discharge Complete -  [place] or Home Care Consult Complete -  [place] Patient Refused -  Some recent data might be hidden

## 2020-02-20 NOTE — Progress Notes (Signed)
Occupational Therapy Treatment Patient Details Name: April [name] MRN: [id number] DOB: 03/20/1956 Today's Date: 02/20/2020    History of present illness Pt is a 64 y.o. female with PMH significant of PCKD s/p renal transplant; HLD; anal condyloma; CHF presenting with SOB/nausea.  She was previously hospitalized from 3/10-16 for sepsis of uncertain etiology in a transplant recipient - possibly from mucopurulent bronchitis.  She had acute respiratory failure with hypoxia. She was recommended to go to CIR but declined and went home with [facility].  Pt fell in January with subsequent L hip and wrist fxs. Pt started experiencing SOB at home and a fever.   OT comments  All goals reviewed and updated. Pt making progress in therapy, demonstrating improved sitting balance/tolerance and independence with self-care tasks. Pt tolerated sitting EOB 20 min with supervision, noting 0 instances of LOB. Pt engaged in grooming/hygiene and LB dressing task while seated EOB. Pt required frequent rest breaks throughout due to SOB. Educated/instructed pt on pursed lip breathing strategies with good understanding and follow through. SpO2 maintained in 90s on 1.5L [place]. Pt noted to be soiled of urine. Attempted standing task x 4 with RW and assist x 2, however pt unable to clear hips off bed. Pt assisted back to bed with toilet hygiene completed at bed level. All questions/concerns answered at this time. OT will continue to follow acutely. Continue to recommend CIR placement for additional rehab prior to discharge home.    Follow Up Recommendations  CIR;Supervision/Assistance - 24 hour    Equipment Recommendations  None recommended by OT    Recommendations for Other Services      Precautions / Restrictions Precautions Precautions: Fall Precaution Comments: L wrist brace for comfort, exercise as tolerated Restrictions Weight Bearing Restrictions: No       Mobility Bed Mobility Overal bed mobility: Needs  Assistance Bed Mobility: Supine to Sit;Sit to Supine     Supine to sit: Mod assist;HOB elevated Sit to supine: Mod assist;+2 for physical assistance;HOB elevated   General bed mobility comments: Pt able to utilize bed rail with BUEs. Assist for trunk for supine to sit transfer. Assist for trunk and legs for sit to supine transfer.   Transfers Overall transfer level: Needs assistance Equipment used: Rolling walker (2 wheeled) Transfers: Sit to/from Stand Sit to Stand: Total assist;+2 physical assistance         General transfer comment: Attempted to stand x 4 this date with RW, however pt unable to clear hips off bed. BLEs very weak this date.     Balance Overall balance assessment: Needs assistance Sitting-balance support: Feet supported Sitting balance-Leahy Scale: Fair       Standing balance-Leahy Scale: (Unable to stand this date. Attempted x 4 with 2 person assis)                             ADL either performed or assessed with clinical judgement   ADL Overall ADL's : Needs assistance/impaired     Grooming: Set up;Supervision/safety;Wash/dry hands;Wash/dry face;Oral care;Sitting Grooming Details (indicate cue type and reason): While seated EOB. Bilateral hand tremors increasing difficulty of task.              Lower Body Dressing: Moderate assistance;Maximal assistance;Sitting/lateral leans Lower Body Dressing Details (indicate cue type and reason): While seated EOB. Assist to start socks over feet and maintain BLEs in figure four position. Pt able to reach and pull up over heels. Increased time with frequent  rest breaks due to fatigue and SOB. SpO2 maintained in 90s.      Toileting- Clothing Manipulation and Hygiene: Maximal assistance;Bed level Toileting - Clothing Manipulation Details (indicate cue type and reason): Attempted to complete in standing, however after 4 attempts pt unable to stand. Completed in supine with pt requiring max assist.         General ADL Comments: Pt tolerated sitting EOB 20 min while engaging in therapy tasks. Noted 0 instances of LOB.      Vision       Perception     Praxis      Cognition Arousal/Alertness: Awake/alert Behavior During Therapy: WFL for tasks assessed/performed Overall Cognitive Status: Within Functional Limits for tasks assessed                                 General Comments: Pt motivated and willing to participate in therapy. Pt tearful towards the end of session due to continued weakness.        Exercises     Shoulder Instructions       General Comments Pt on 1.5L [place]. No signs/symptoms of distress. SpO2 maintained in 90s    Pertinent Vitals/ Pain       Pain Assessment: Faces Faces Pain Scale: Hurts little more Pain Location: Mouth Pain Descriptors / Indicators: Discomfort;Sore Pain Intervention(s): Monitored during session  Home Living                                          Prior Functioning/Environment              Frequency           Progress Toward Goals  OT Goals(current goals can now be found in the care plan section)  Progress towards OT goals: Progressing toward goals  Acute Rehab OT Goals Time For Goal Achievement: 03/05/20  Plan Discharge plan remains appropriate    Co-evaluation                 AM-PAC OT "6 Clicks" Daily Activity     Outcome Measure   Help from another person eating meals?: A Little Help from another person taking care of personal grooming?: A Little Help from another person toileting, which includes using toliet, bedpan, or urinal?: A Lot Help from another person bathing (including washing, rinsing, drying)?: A Lot Help from another person to put on and taking off regular upper body clothing?: A Little Help from another person to put on and taking off regular lower body clothing?: A Lot 6 Click Score: 15    End of Session Equipment Utilized During Treatment:  Oxygen;Gait belt;Rolling walker  OT Visit Diagnosis: Other abnormalities of gait and mobility (R26.89);Muscle weakness (generalized) (M62.81);History of falling (Z91.81)   Activity Tolerance Patient limited by fatigue   Patient Left in bed;with call bell/phone within reach;with bed alarm set   Nurse Communication Mobility status        Time: 4825-0037 OT Time Calculation (min): 42 min  Charges: OT General Charges $OT Visit: 1 Visit OT Treatments $Self Care/Home Management : 8-22 mins $Therapeutic Activity: 23-37 mins  Mauri Brooklyn OTR/L [phone]   Mauri Brooklyn 02/20/2020, 11:27 AM

## 2020-02-20 NOTE — Plan of Care (Signed)

## 2020-02-20 NOTE — Progress Notes (Signed)
Conversed with patient about using PPV tonight.  RR stable.  No respiratory distress noted.  Patient does not have a hx of OSA.  PPV remains on standby.

## 2020-02-20 NOTE — Progress Notes (Signed)
Patient assessed on 1 liter [place].  No respiratory distress noted.  Bipap on SB.

## 2020-02-20 NOTE — Progress Notes (Signed)
Inpatient Rehabilitation-Admissions Coordinator   Consult order received again. I am familiar with this patient from previous CIR admission (1/22-2/12). I have also assessed her this admission for possible return to CIR (3/16) and followed her progress with therapies at a distance throughout this stay. Today, I met with pt and her husband at the bedside per her request to address her concerns about returning to CIR. She expressed the same hesitations about her ability to tolerate our program that she expressed to me back on 3/16. We discussed that the expectation would be for her to participate in 3 hours of therapy/day and that we would continue to address her mobility and endurance deficits. We had an extensive discussion about her expectations for a rehab program and the extent of support she has at home. Pt aware it is not recommended that she return home and that she needs short term rehab. We concluded that based on her current tolerance, her preference for a less intensive setting, and her lack of 24/7 A at DC, that SNF placement would be most appropriate. Pt and husband aware that I will sign off and I will contact TOC regarding next steps for SNF placement.   Please call if questions.   Raechel Ache, OTR/L  Rehab Admissions Coordinator  [phone] 02/20/2020 1:30 PM

## 2020-02-20 NOTE — TOC Progression Note (Signed)
Transition of Care [facility]) - Progression Note    Patient Details  Name: April [name] MRN: [id number] Date of Birth: [birth year]  Transition of Care [facility]) CM/SW [place], RN Phone Number: 02/20/2020, 4:10 PM  Clinical Narrative:    Patient is now agreeable to discharge to SNF. Medicare.gov choice list has been provided to the patient. Patient would like to review list and make decision with her husband who is at bedside. Will continue to follow.    Expected Discharge Plan: Home/Self Care Barriers to Discharge: Continued Medical Work up  Expected Discharge Plan and Services Expected Discharge Plan: Home/Self Care In-house Referral: NA Discharge Planning Services: CM Consult   Living arrangements for the past 2 months: Single Family Home(2 story home but can maintain on the first level)                           HH Arranged: ([place] active prior to admit and will continue upon discharge) [place] Agency: [facility] (now [facility] prior to admit)         Social Determinants of Health (SDOH) Interventions    Readmission Risk Interventions Readmission Risk Prevention Plan 02/09/2020 04/20/2019  Transportation Screening Complete Complete  PCP or Specialist Appt within 3-5 Days - Not Complete  Not Complete comments - not yet ready for d/c  HRI or [place] - Not Complete  HRI or Home Care Consult comments - no needs identified  Social Work Consult for [place] Planning/Counseling - Not Complete  SW consult not completed comments - no needs identified  Palliative Care Screening - Not Applicable  Medication Review (RN Care Manager) Referral to Pharmacy Complete  PCP or Specialist appointment within 3-5 days of discharge Complete -  [place] or Home Care Consult Complete -  [place] Patient Refused -  Some recent data might be hidden

## 2020-02-21 DIAGNOSIS — D638 Anemia in other chronic diseases classified elsewhere: Secondary | ICD-10-CM

## 2020-02-21 DIAGNOSIS — Z7189 Other specified counseling: Secondary | ICD-10-CM

## 2020-02-21 NOTE — Plan of Care (Signed)

## 2020-02-21 NOTE — Progress Notes (Signed)
Nutrition Follow-up  DOCUMENTATION CODES:   Obesity unspecified  INTERVENTION:   -Continue MVI with minerals daily -Continue MVI with minerals daily -Magic cup TID with meals, each supplement provides 290 kcal and 9 grams of protein -Feeding assistance with meals  NUTRITION DIAGNOSIS:   Inadequate oral intake related to decreased appetite as evidenced by per patient/family report.  Ongoing  GOAL:   Patient will meet greater than or equal to 90% of their needs  Progressing   MONITOR:   PO intake, Supplement acceptance, Labs, Weight trends, Skin, I & O's  REASON FOR ASSESSMENT:   Malnutrition Screening Tool    ASSESSMENT:   April [name] is a 64 y.o. female with medical history significant of PCKD s/p renal transplant; HLD; anal condyloma; and CHF presenting with SOB/nausea.  She was previously hospitalized from 3/10-16 for sepsis of uncertain etiology in a transplant recipient - possibly from mucopurulent bronchitis.  She had acute respiratory failure with hypoxia.  ID was involved and she was treated with broad spectrum antibiotics and transitioned to IV ceftriaxone.  She was recommended to go to CIR but declined and went home with home health services.  She felt good when she went home - except that she was having horrible burning diarrhea "from the antibiotics."  That got worse and worse and she couldn't tolerate it anymore.  She didn't have as much help at home to help her clean up.  She has been coughing and dry heaving.  Her last BM was yesterday.  Overnight, she started having a hard time breathing and couldn't quit dry heaving.  She had recurrent fever at home up to 100.4.  She is no longer still having cough.  No urinary symptoms.  Reviewed I/O's: -1.9 L x 24 hours and -11.5 L since admission  UOP: 2.3 L x 24 hours  Pt's mental status improved since respiratory event on 02/14/20. Intake remains variable; noted meal completion 0-100%. Pt has been intermittently  refusing Ensure supplements per MAR. Noted opened Ensure at bedside, however, container was full.   Pt sleeping soundly at time of visit. RD noticed tremors in bilateral arms and hands, which RD did not observe during initial assessment. She has accepted approximately one Ensure daily over the past week.   Plan to d/c to SNF once medically stable.   Labs reviewed: K: 5.3.   Diet Order:   Diet Order            Diet regular Room service appropriate? Yes; Fluid consistency: Thin  Diet effective now              EDUCATION NEEDS:   Education needs have been addressed  Skin:  Skin Assessment: Reviewed RN Assessment  Last BM:  02/17/20  Height:   Ht Readings from Last 1 Encounters:  02/08/20 5\' 2"  (1.575 m)    Weight:   Wt Readings from Last 1 Encounters:  02/10/20 80.6 kg    Ideal Body Weight:  50 kg  BMI:  Body mass index is 32.5 kg/m.  Estimated Nutritional Needs:   Kcal:  1900-2100  Protein:  100-115 grams  Fluid:  > 1.9 L    Loistine Chance, RD, LDN, [place] Registered Dietitian II Certified Diabetes Care and Education Specialist Please refer to [facility] for RD and/or RD on-call/weekend/after hours pager

## 2020-02-21 NOTE — Progress Notes (Signed)
PROGRESS NOTE    April [name]  NID:[id number] DOB: [birth year] DOA: 02/08/2020 PCP: [name], MD   Brief Narrative:   April [name] a 64 y.o.femalewith medical history significant ofPCKD s/p renal transplant; HLD; anal condyloma; and CHF presenting with SOB/nausea. She was previously hospitalized from 3/10-16 for sepsis of uncertain etiology in a transplant recipient - possibly from mucopurulent bronchitis. She had acute respiratory failure with hypoxia. ID was involved and she was treated with broad spectrum antibiotics and transitioned to IV ceftriaxone. She was recommended to go to CIR but declined and went home with home health services. She felt good when she went home - except that she was having horrible burning diarrhea "from the antibiotics." She had recurrent fever at home up to 100.4. Pt fell in January - hip and pelvic fractures as well as wrist fracture. She went to CIR but was in too much pain to be successful. She is now bed bound. Every time she starts to get more mobile, she has a setback or illness. They recommended return to CIR after last hospitalization. She declined because she didn't have the strength to keep up with the program. She refused a SNF rehab due to her immunosuppression. She continues to feel the same about disposition.   Assessment & Plan:   Principal Problem:   Sepsis ([place]) Active Problems:   Renal transplant recipient   Chronic diastolic CHF (congestive heart failure) (HCC)   Physical deconditioning   Essential hypertension   Glaucoma   SVT (supraventricular tachycardia) (HCC)   Anemia of chronic disease   Palliative care encounter   Goals of care, counseling/discussion   SIRS without source in an immunocompromised patient, uncertain etiology, POA, resolved  - Patient with recent hospitalization, discharged on 3/16 after sepsis from uncertain source - She returned with fever, tachycardia, tachypnea concerning for  recurrent sepsis - She did develop severe diarrhea with Augmentin but this appears to have abated previously - Bld Cx NTD - ID consulted, appreciate assistance     -3/29 - back on home lasix - likely need prolonged steroid taper at DC  Acute hypoxic respiratory failure likely in setting of heart failure exacerbation and volume overload     -Patient diuresing well on Lasix, continue to follow I's and O's, not yet euvolemic but markedly improving from previous.     - ABG previously performed unremarkable, VQ scan negative     -Continue to wean oxygen as tolerated  Acute metabolic encephalopathy secondary to above, resolved     -Patient's mental status now back to baseline  PCKD s/p renal transplantation - continue immunosuppressants including mycophenolate and tacrolimus - continue stress dose steroids; will need to taper  HTN, essential - 3/21: d/c metoprolol and started verapamil - well controlled currently  Glaucoma/Dry eye - continue home drops  Debility, ambulator dysfunction - Since her fall in January, she has been essentially bed bound - She is quite deconditioned and this is unlikely to improve her long-term outcomes - Pt is encouraged to get OOB to chair at least q shift - palliative care consulted, rec oupt follow up     -Plan is for SNF  Asthma, not in acute exacerbation - continue dulera, mucinex     -Appears to be well controlled  Gout - Allopurinol  Chronic spasm/pain - robaxin, gabapentin  Anxiety/depression - prozac  GERD - protonix  Normocytic anemia Thrombocytopenia - hold lovenox - no evidence of bleed - check hemoccult - 3/23: Hgb 7.1; question symptomatic anemia,  transfused 1 unit pRBCs     - follow cbc closely   DVT prophylaxis: SCDs Code Status: FULL Family Communication: Patient to update family  Disposition Plan: Continues as inpatient, ultimate  disposition likely SNF - discussed with CIR but patient refusing admission there as she feels like she is too weak to handle their PT regimen. Awaiting insurance approval and bed authorization for SNF placement -CM aware.  Consultants:   ID  PC  Subjective: Met patient sleeping, easily arousable.  Denies any new complaints, denies any worsening shortness of breath, chest pain, abdominal pain, nausea/vomiting, fever/chills.  Patient noted to have some mild tremor for which she stated its chronic and has been ongoing, denies it worsening.  Objective: Vitals:   02/20/20 2140 02/21/20 0815 02/21/20 0831 02/21/20 1258  BP: (!) 127/59  (!) 147/67 128/62  Pulse: 80  88 86  Resp:   19 19  Temp: 98.2 F (36.8 C)  97.8 F (36.6 C) 98.5 F (36.9 C)  TempSrc:      SpO2: 100% 98% 100% 100%  Weight:      Height:        Intake/Output Summary (Last 24 hours) at 02/21/2020 1531 Last data filed at 02/21/2020 1201 Gross per 24 hour  Intake 360 ml  Output 2100 ml  Net -1740 ml   Filed Weights   02/08/20 0728 02/10/20 9357  Weight: 75.5 kg 80.6 kg    Examination:  General: NAD   Cardiovascular: S1, S2 present  Respiratory: CTAB  Abdomen: Soft, nontender, nondistended, bowel sounds present  Musculoskeletal: +bilateral pedal edema noted  Skin: Normal  Psychiatry: Normal mood    Data Reviewed: I have personally reviewed following labs and imaging studies.  CBC: Recent Labs  Lab 02/15/20 0217 02/16/20 0309 02/17/20 0414 02/18/20 0202 02/19/20 0301  WBC 11.1* 7.3 5.3 5.7 5.9  HGB 7.5* 7.7* 7.5* 7.8* 7.9*  HCT 25.7* 26.5* 25.9* 27.6* 27.7*  MCV 94.8 95.0 95.9 97.9 96.9  PLT 146* 163 165 178 017*   Basic Metabolic Panel: Recent Labs  Lab 02/15/20 0217 02/16/20 0309 02/17/20 0414 02/18/20 0202 02/19/20 0301  NA 139 137 138 135 142  K 4.2 4.5 4.1 4.6 5.3*  CL 103 98 101 97* 100  CO2 26 25 27 27  32  GLUCOSE 169* 198* 184* 166* 172*  BUN 30* 46* 48* 51* 47*   CREATININE 1.08* 1.31* 1.24* 1.16* 1.04*  CALCIUM 8.9 8.5* 8.4* 8.8* 9.0   GFR: Estimated Creatinine Clearance: 55.2 mL/min (A) (by C-G formula based on SCr of 1.04 mg/dL (H)). Liver Function Tests: Recent Labs  Lab 02/15/20 0217 02/16/20 0309 02/17/20 0414 02/18/20 0202 02/19/20 0301  AST 12* 13* 13* 18 16  ALT 13 14 14 17 18   ALKPHOS 97 88 75 78 71  BILITOT 1.2 1.1 0.9 1.0 1.2  PROT 6.3* 6.1* 6.0* 5.7* 5.6*  ALBUMIN 2.0* 2.1* 2.2* 2.3* 2.3*   Coagulation Profile: No results for input(s): INR, PROTIME in the last 168 hours. Sepsis Labs: No results for input(s): PROCALCITON, LATICACIDVEN in the last 168 hours.  No results found for this or any previous visit (from the past 240 hour(s)).    Radiology Studies: No results found.   Scheduled Meds: . allopurinol  300 mg Oral Daily  . aspirin  81 mg Oral Daily  . brimonidine  1 drop Left Eye BID  . Chlorhexidine Gluconate Cloth  6 each Topical Daily  . clonazepam  0.25 mg Oral BID  . cycloSPORINE  1 drop Both Eyes BID  . dorzolamide  1 drop Both Eyes Daily  . feeding supplement (ENSURE ENLIVE)  237 mL Oral BID BM  . FLUoxetine  40 mg Oral Daily  . furosemide  40 mg Oral Daily  . gabapentin  200 mg Oral TID  . guaiFENesin  600 mg Oral BID  . latanoprost  1 drop Both Eyes QHS  . magnesium oxide  400 mg Oral BID  . methocarbamol  500 mg Oral QID  . mometasone-formoterol  2 puff Inhalation BID  . multivitamin with minerals  1 tablet Oral Daily  . mycophenolate  180 mg Oral BID  . pantoprazole  40 mg Oral BID  . predniSONE  20 mg Oral Q breakfast  . saccharomyces boulardii  250 mg Oral BID  . sodium chloride flush  3 mL Intravenous Q12H  . tacrolimus  0.5 mg Oral QHS  . tacrolimus  1 mg Oral Daily  . verapamil  180 mg Oral Daily   Continuous Infusions:    LOS: 13 days   Time spent: 30mnutes  NAlma Friendly MD [facility]  If 7PM-7AM, please contact night-coverage [email or web address] 02/21/2020,  3:31 PM

## 2020-02-21 NOTE — Progress Notes (Signed)
Occupational Therapy Treatment Patient Details Name: April [name] MRN: [id number] DOB: [birth year] Today's Date: 02/21/2020    History of present illness Pt is a 64 y.o. female with PMH significant of PCKD s/p renal transplant; HLD; anal condyloma; CHF presenting with SOB/nausea.  She was previously hospitalized from 3/10-16 for sepsis of uncertain etiology in a transplant recipient - possibly from mucopurulent bronchitis.  She had acute respiratory failure with hypoxia. She was recommended to go to CIR but declined and went home with [facility].  Pt fell in January with subsequent L hip and wrist fxs. Pt started experiencing SOB at home and a fever.   OT comments  Pt continues to demonstrate significant weakness and decreased activity tolerance, but immediately willing to work with therapies despite being awakened. Pt not able to stand, but performed squat-pivot with +2 max assist to chair. Left lift pad under pt for return to bed by nursing staff. Pt completed grooming once up in chair. Pt is agreeable to further rehab in SNF. Updated d/c disposition.  Follow Up Recommendations  SNF;Supervision/Assistance - 24 hour    Equipment Recommendations  None recommended by OT    Recommendations for Other Services      Precautions / Restrictions Precautions Precautions: Fall Precaution Comments: L wrist brace for comfort, exercise as tolerated Restrictions Weight Bearing Restrictions: No       Mobility Bed Mobility Overal bed mobility: Needs Assistance Bed Mobility: Supine to Sit     Supine to sit: Min assist     General bed mobility comments: increased time and use of bed rail, min assist with use of bed pad to pull L hip to EOB  Transfers Overall transfer level: Needs assistance Equipment used: Rolling walker (2 wheeled) Transfers: Sit to/from [employer] Sit to Stand: Max assist;+2 physical assistance;From elevated surface   Squat pivot transfers: Max assist;+2  physical assistance     General transfer comment: Attempted standing x 2-3 reps this visit. Patient able to clear bottom but unable to get standing. Very weak.    Balance Overall balance assessment: Needs assistance Sitting-balance support: Feet supported Sitting balance-Leahy Scale: Good Sitting balance - Comments: improved sitting balance this session. ABle to sit unsupported at edge of bed.   Standing balance support: During functional activity;Bilateral upper extremity supported Standing balance-Leahy Scale: Zero Standing balance comment: Stood for less than 5 seconds and unable to stand fully upright.                            ADL either performed or assessed with clinical judgement   ADL Overall ADL's : Needs assistance/impaired     Grooming: Wash/dry hands;Wash/dry face;Oral care;Sitting;Set up                   Toilet Transfer: Squat-pivot;+2 for physical assistance;Maximal assistance;BSC   Toileting- Clothing Manipulation and Hygiene: Total assistance;+2 for physical assistance               Vision       Perception     Praxis      Cognition Arousal/Alertness: Awake/alert Behavior During Therapy: WFL for tasks assessed/performed Overall Cognitive Status: Within Functional Limits for tasks assessed                                 General Comments: Pt motivated and willing to participate in therapy.  Exercises Other Exercises Other Exercises: seated LAQ and marching while seated in recliner x 15 reps.   Shoulder Instructions       General Comments      Pertinent Vitals/ Pain       Pain Assessment: No/denies pain Faces Pain Scale: Hurts little more  Home Living                                          Prior Functioning/Environment              Frequency  Min 2X/week        Progress Toward Goals  OT Goals(current goals can now be found in the care plan section)  Progress  towards OT goals: Progressing toward goals  Acute Rehab OT Goals Patient Stated Goal: to get stronger OT Goal Formulation: With patient Time For Goal Achievement: 03/05/20 Potential to Achieve Goals: Good  Plan Discharge plan needs to be updated    Co-evaluation    PT/OT/SLP Co-Evaluation/Treatment: Yes Reason for Co-Treatment: For patient/therapist safety PT goals addressed during session: Mobility/safety with mobility OT goals addressed during session: ADL's and self-care      AM-PAC OT "6 Clicks" Daily Activity     Outcome Measure   Help from another person eating meals?: None Help from another person taking care of personal grooming?: A Little Help from another person toileting, which includes using toliet, bedpan, or urinal?: A Lot Help from another person bathing (including washing, rinsing, drying)?: A Lot Help from another person to put on and taking off regular upper body clothing?: A Little Help from another person to put on and taking off regular lower body clothing?: A Lot 6 Click Score: 16    End of Session Equipment Utilized During Treatment: Gait belt;Rolling walker  OT Visit Diagnosis: Other abnormalities of gait and mobility (R26.89);Muscle weakness (generalized) (M62.81);History of falling (Z91.81)   Activity Tolerance Patient tolerated treatment well   Patient Left in chair;with call bell/phone within reach   Nurse Communication          Time: 7893-8101 OT Time Calculation (min): 35 min  Charges: OT General Charges $OT Visit: 1 Visit OT Treatments $Self Care/Home Management : 8-22 mins  Nestor Lewandowsky, OTR/L Acute Rehabilitation Services Pager: [phone] Office: [phone]   Malka So 02/21/2020, 1:51 PM

## 2020-02-21 NOTE — Progress Notes (Signed)
Physical Therapy Treatment Patient Details Name: April [name] MRN: [id number] DOB: 1955-12-29 Today's Date: 02/21/2020    History of Present Illness Pt is a 64 y.o. female with PMH significant of PCKD s/p renal transplant; HLD; anal condyloma; CHF presenting with SOB/nausea.  She was previously hospitalized from 3/10-16 for sepsis of uncertain etiology in a transplant recipient - possibly from mucopurulent bronchitis.  She had acute respiratory failure with hypoxia. She was recommended to go to CIR but declined and went home with [facility].  Pt fell in January with subsequent L hip and wrist fxs. Pt started experiencing SOB at home and a fever.    PT Comments    Patient received sleeping in bed, easily roused. Patient agrees to PT session. Reports he hand swelling is improved. She continues to be very weak with tremulous UEs. Patient requires mod assist with supine to sit to raise trunk to seated position and to scoot hips forward on bed. She attempted to stand 2x with RW and max +2 assist. Unable to get full standing. Therapists then assisted patient to recliner via squat pivot transfer with Max +2 assist. Patient will continue to benefit from skilled PT while here to improve strength and functional independence.       Follow Up Recommendations  SNF;Other (comment)(patient is refusing CIR)     Equipment Recommendations  None recommended by PT    Recommendations for Other Services       Precautions / Restrictions Precautions Precautions: Fall Restrictions Weight Bearing Restrictions: No    Mobility  Bed Mobility Overal bed mobility: Needs Assistance Bed Mobility: Supine to Sit     Supine to sit: Mod assist;+2 for physical assistance     General bed mobility comments: Pt able to utilize bed rail with BUEs. Assist for trunk for supine to sit transfer. HOB elevated  Transfers Overall transfer level: Needs assistance Equipment used: Rolling walker (2 wheeled) Transfers: Sit  to/from [employer] Sit to Stand: Max assist;+2 physical assistance;From elevated surface   Squat pivot transfers: Max assist;+2 physical assistance     General transfer comment: Attempted standing x 2-3 reps this visit. Patient able to clear bottom but unable to get standing. Very weak.  Ambulation/Gait             General Gait Details: unable   Stairs             Wheelchair Mobility    Modified Rankin (Stroke Patients Only)       Balance Overall balance assessment: Needs assistance Sitting-balance support: Feet supported Sitting balance-Leahy Scale: Good Sitting balance - Comments: improved sitting balance this session. ABle to sit unsupported at edge of bed.   Standing balance support: During functional activity;Bilateral upper extremity supported Standing balance-Leahy Scale: Zero Standing balance comment: Stood for less than 5 seconds and unable to stand fully upright.                             Cognition Arousal/Alertness: Awake/alert Behavior During Therapy: WFL for tasks assessed/performed Overall Cognitive Status: Within Functional Limits for tasks assessed                                 General Comments: Pt motivated and willing to participate in therapy.      Exercises Other Exercises Other Exercises: seated LAQ and marching while seated in recliner x 15 reps.  General Comments        Pertinent Vitals/Pain Pain Assessment: No/denies pain    Home Living                      Prior Function            PT Goals (current goals can now be found in the care plan section) Acute Rehab PT Goals Patient Stated Goal: to get stronger PT Goal Formulation: With patient Time For Goal Achievement: 02/28/20 Potential to Achieve Goals: Fair Progress towards PT goals: Progressing toward goals    Frequency    Min 3X/week      PT Plan Discharge plan needs to be updated    Co-evaluation  PT/OT/SLP Co-Evaluation/Treatment: Yes Reason for Co-Treatment: Complexity of the patient's impairments (multi-system involvement);For patient/therapist safety;To address functional/ADL transfers PT goals addressed during session: Mobility/safety with mobility        AM-PAC PT "6 Clicks" Mobility   Outcome Measure  Help needed turning from your back to your side while in a flat bed without using bedrails?: A Little Help needed moving from lying on your back to sitting on the side of a flat bed without using bedrails?: A Lot Help needed moving to and from a bed to a chair (including a wheelchair)?: A Lot Help needed standing up from a chair using your arms (e.g., wheelchair or bedside chair)?: Total Help needed to walk in hospital room?: Total Help needed climbing 3-5 steps with a railing? : Total 6 Click Score: 10    End of Session Equipment Utilized During Treatment: Gait belt Activity Tolerance: Patient limited by fatigue Patient left: in chair;with call bell/phone within reach Nurse Communication: Mobility status PT Visit Diagnosis: Muscle weakness (generalized) (M62.81);History of falling (Z91.81);Difficulty in walking, not elsewhere classified (R26.2)     Time: 1657-9038 PT Time Calculation (min) (ACUTE ONLY): 35 min  Charges:  $Therapeutic Activity: 8-22 mins                     Norvella Loscalzo, PT, GCS 02/21/20,12:42 PM

## 2020-02-21 NOTE — NC FL2 (Addendum)
[place] LEVEL OF CARE SCREENING TOOL     IDENTIFICATION  Patient Name: April [name] Birthdate: [birth year] Sex: female Admission Date (Current Location): 02/08/2020  [facility] and [place] Number:  Herbalist and Address:  [facility], [place] [address], [place], [place] [address]      Provider Number: [id number]  Attending Physician Name and Address:  Alma Friendly, MD  Relative Name and Phone Number:  [name] [phone] cell    Current Level of Care: Hospital Recommended Level of Care: [place] Prior Approval Number:    Date Approved/Denied:   PASRR Number:    Discharge Plan: SNF    Current Diagnoses: Patient Active Problem List   Diagnosis Date Noted  . SVT (supraventricular tachycardia) ([place])   . Anemia of chronic disease   . Palliative care encounter   . Goals of care, counseling/discussion   . Essential hypertension 02/08/2020  . Glaucoma 02/08/2020  . Sepsis ([place]) 01/31/2020  . Hypoxia   . Shortness of breath   . Fracture of left superior pubic ramus (HCC)   . Pain   . Anxiety state   . Left displaced femoral neck fracture ([place]) 12/15/2019  . Status post total hip replacement, left   . Post-op pain   . Acute blood loss anemia   . Polycystic kidney   . Closed left hip fracture ([place]) 12/07/2019  . Left wrist fracture 12/07/2019  . Medication management 09/22/2019  . Physical deconditioning 09/22/2019  . Hyponatremia 06/29/2019  . Acute respiratory failure with hypoxia ([place]) 04/19/2019  . Acute on chronic diastolic CHF (congestive heart failure) ([place]) 04/19/2019  . Bilateral closed proximal tibial fracture 12/21/2018  . Closed right ankle fracture 12/21/2018  . Fracture 12/21/2018  . Lower urinary tract infectious disease 10/03/2018  . Asthma, chronic, unspecified asthma severity, with acute exacerbation 10/03/2018  . SIRS (systemic inflammatory response  syndrome) ([place]) 10/03/2018  . Nausea vomiting and diarrhea 10/02/2018  . Chronic diastolic CHF (congestive heart failure) ([place]) 10/01/2017  . Influenza B 10/01/2017  . Persistent asthma with status asthmaticus   . Pneumonia 07/15/2016  . Asthma, mild intermittent 07/15/2016  . GERD (gastroesophageal reflux disease) 07/15/2016  . Renal transplant recipient 07/15/2016  . Hyperlipidemia 07/15/2016  . Depression 07/15/2016  . Bronchitis, mucopurulent recurrent ([place]) 08/08/2014  . Chronic cough 07/24/2014  . End stage renal disease ([place]) 03/23/2012  . 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, implant, and graft 03/23/2012    Orientation RESPIRATION BLADDER Height & Weight     Self, Time, Situation, Place  O2, Normal(1L [place]) Incontinent Weight: 80.6 kg Height:  5\' 2"  (157.5 cm)  BEHAVIORAL SYMPTOMS/MOOD NEUROLOGICAL BOWEL NUTRITION STATUS   Anxiety   Continent Diet(Regular)  AMBULATORY STATUS COMMUNICATION OF NEEDS Skin   Extensive Assist(to stand and pivot too weak to ambulate) Verbally Bruising(Bilateral arms and hands)                       Personal Care Assistance Level of Assistance  Bathing, Feeding, Dressing Bathing Assistance: Maximum assistance Feeding assistance: Independent(assist with set up only) Dressing Assistance: Maximum assistance     Functional Limitations Info  Sight, Hearing, Speech Sight Info: Adequate Hearing Info: Adequate Speech Info: Adequate    SPECIAL CARE FACTORS FREQUENCY  PT (By licensed PT), OT (By licensed OT)     PT Frequency: 5X OT Frequency: 5X  Contractures      Additional Factors Info  Code Status, Allergies, Psychotropic, Insulin Sliding Scale, Isolation Precautions Code Status Info: Full Allergies Info: Infed Iron Dextran, Pentamidine, Erythromycin Erythromycin, Iohexol, Oxycodone, Erythromycin, Ultram Tramadol Hc Psychotropic Info: none Insulin Sliding Scale Info: none Isolation Precautions Info:  none     Current Medications (02/21/2020):  This is the current hospital active medication list Current Facility-Administered Medications  Medication Dose Route Frequency Provider Last Rate Last Admin  . acetaminophen (TYLENOL) tablet 650 mg  650 mg Oral Q6H PRN Karmen Bongo, MD       Or  . acetaminophen (TYLENOL) suppository 650 mg  650 mg Rectal Q6H PRN Karmen Bongo, MD      . albuterol (PROVENTIL) (2.5 MG/3ML) 0.083% nebulizer solution 2.5 mg  2.5 mg Nebulization Q4H PRN Rise Patience, MD   2.5 mg at 02/17/20 0233  . allopurinol (ZYLOPRIM) tablet 300 mg  300 mg Oral Daily Karmen Bongo, MD   300 mg at 02/21/20 0848  . aspirin chewable tablet 81 mg  81 mg Oral Daily Karmen Bongo, MD   81 mg at 02/21/20 0848  . brimonidine (ALPHAGAN) 0.15 % ophthalmic solution 1 drop  1 drop Left Eye BID Karmen Bongo, MD   1 drop at 02/21/20 [phone]  . Chlorhexidine Gluconate Cloth 2 % PADS 6 each  6 each Topical Daily Little Ishikawa, MD   6 each at 02/21/20 [phone]  . clonazePAM (KLONOPIN) disintegrating tablet 0.25 mg  0.25 mg Oral BID Marylyn Ishihara, Tyrone A, DO   0.25 mg at 02/21/20 0847  . cycloSPORINE (RESTASIS) 0.05 % ophthalmic emulsion 1 drop  1 drop Both Eyes BID Karmen Bongo, MD   1 drop at 02/20/20 2121  . dorzolamide (TRUSOPT) 2 % ophthalmic solution 1 drop  1 drop Both Eyes Daily Karmen Bongo, MD   Stopped at 02/20/20 1029  . feeding supplement (ENSURE ENLIVE) (ENSURE ENLIVE) liquid 237 mL  237 mL Oral BID BM Kyle, Tyrone A, DO   237 mL at 02/21/20 0853  . FLUoxetine (PROZAC) capsule 40 mg  40 mg Oral Daily Karmen Bongo, MD   40 mg at 02/21/20 0849  . furosemide (LASIX) tablet 40 mg  40 mg Oral Daily Little Ishikawa, MD   40 mg at 02/21/20 0847  . gabapentin (NEURONTIN) capsule 200 mg  200 mg Oral TID Karmen Bongo, MD   200 mg at 02/21/20 0849  . guaiFENesin (MUCINEX) 12 hr tablet 600 mg  600 mg Oral BID Karmen Bongo, MD   600 mg at 02/21/20 0848  .  HYDROcodone-acetaminophen (NORCO/VICODIN) 5-325 MG per tablet 1 tablet  1 tablet Oral Q4H PRN Karmen Bongo, MD      . ipratropium-albuterol (DUONEB) 0.5-2.5 (3) MG/3ML nebulizer solution 3 mL  3 mL Nebulization Q4H PRN Lang Snow, FNP   3 mL at 02/13/20 2353  . latanoprost (XALATAN) 0.005 % ophthalmic solution 1 drop  1 drop Both Eyes QHS Karmen Bongo, MD   1 drop at 02/20/20 2123  . magic mouthwash w/lidocaine  5 mL Oral TID PRN Little Ishikawa, MD   5 mL at 02/20/20 2142  . magnesium oxide (MAG-OX) tablet 400 mg  400 mg Oral BID Marylyn Ishihara, Tyrone A, DO   400 mg at 02/21/20 0848  . methocarbamol (ROBAXIN) tablet 500 mg  500 mg Oral QID Karmen Bongo, MD   500 mg at 02/21/20 0849  . mometasone-formoterol (DULERA) 200-5 MCG/ACT inhaler 2 puff  2 puff Inhalation BID Lorin Mercy,  Anderson Malta, MD   2 puff at 02/21/20 0813  . multivitamin with minerals tablet 1 tablet  1 tablet Oral Daily Marylyn Ishihara, Tyrone A, DO   1 tablet at 02/21/20 0849  . mycophenolate (MYFORTIC) EC tablet 180 mg  180 mg Oral BID Karmen Bongo, MD   180 mg at 02/21/20 0848  . ondansetron (ZOFRAN) tablet 4 mg  4 mg Oral Q6H PRN Karmen Bongo, MD       Or  . ondansetron [facility]) injection 4 mg  4 mg Intravenous Q6H PRN Karmen Bongo, MD   4 mg at 02/14/20 0755  . pantoprazole (PROTONIX) EC tablet 40 mg  40 mg Oral BID Karmen Bongo, MD   40 mg at 02/21/20 0847  . predniSONE (DELTASONE) tablet 20 mg  20 mg Oral Q breakfast Little Ishikawa, MD   20 mg at 02/21/20 0848  . saccharomyces boulardii (FLORASTOR) capsule 250 mg  250 mg Oral BID Karmen Bongo, MD   250 mg at 02/21/20 0848  . sodium chloride flush (NS) 0.9 % injection 3 mL  3 mL Intravenous Q12H Karmen Bongo, MD   3 mL at 02/21/20 0853  . tacrolimus (PROGRAF) capsule 0.5 mg  0.5 mg Oral QHS Karmen Bongo, MD   0.5 mg at 02/20/20 2122  . tacrolimus (PROGRAF) capsule 1 mg  1 mg Oral Daily Karmen Bongo, MD   1 mg at 02/21/20 0848  . technetium albumin  aggregated (MAA) injection solution 1.7 millicurie  1.7 millicurie Intravenous Once PRN Barnet Glasgow, MD      . verapamil (CALAN-SR) CR tablet 180 mg  180 mg Oral Daily Kyle, Tyrone A, DO   180 mg at 02/20/20 1026     Discharge Medications: Please see discharge summary for a list of discharge medications.  Relevant Imaging Results:  Relevant Lab Results:   Additional Information SSN# [id number]  [name], RN

## 2020-02-22 LAB — BASIC METABOLIC PANEL
Anion gap: 11 (ref 5–15)
BUN: 32 mg/dL — ABNORMAL HIGH (ref 8–23)
CO2: 31 mmol/L (ref 22–32)
Calcium: 9.1 mg/dL (ref 8.9–10.3)
Chloride: 100 mmol/L (ref 98–111)
Creatinine, Ser: 0.94 mg/dL (ref 0.44–1.00)
GFR calc Af Amer: >60 mL/min (ref >60)
GFR calc non Af Amer: >60 mL/min (ref >60)
Glucose, Bld: 144 mg/dL — ABNORMAL HIGH (ref 70–99)
Potassium: 4.6 mmol/L (ref 3.5–5.1)
Sodium: 142 mmol/L (ref 135–145)

## 2020-02-22 LAB — CBC WITH DIFFERENTIAL/PLATELET
Abs Immature Granulocytes: 0 10*3/uL (ref 0.00–0.07)
Basophils Absolute: 0 10*3/uL (ref 0.0–0.1)
Basophils Relative: 0 %
Eosinophils Absolute: 0 10*3/uL (ref 0.0–0.5)
Eosinophils Relative: 0 %
HCT: 29.3 % — ABNORMAL LOW (ref 36.0–46.0)
Hemoglobin: 8.3 g/dL — ABNORMAL LOW (ref 12.0–15.0)
Lymphocytes Relative: 6 %
Lymphs Abs: 0.4 10*3/uL — ABNORMAL LOW (ref 0.7–4.0)
MCH: 27.9 pg (ref 26.0–34.0)
MCHC: 28.3 g/dL — ABNORMAL LOW (ref 30.0–36.0)
MCV: 98.3 fL (ref 80.0–100.0)
Monocytes Absolute: 0.1 10*3/uL (ref 0.1–1.0)
Monocytes Relative: 2 %
Neutro Abs: 6.2 10*3/uL (ref 1.7–7.7)
Neutrophils Relative %: 92 %
Platelets: 173 10*3/uL (ref 150–400)
RBC: 2.98 MIL/uL — ABNORMAL LOW (ref 3.87–5.11)
RDW: 21.5 % — ABNORMAL HIGH (ref 11.5–15.5)
WBC: 6.7 10*3/uL (ref 4.0–10.5)
nRBC: 0 % (ref 0.0–0.2)
nRBC: 2 /100 WBC — ABNORMAL HIGH

## 2020-02-22 LAB — SARS CORONAVIRUS 2 (TAT 6-24 HRS): SARS Coronavirus 2: NEGATIVE

## 2020-02-22 MED ORDER — NYSTATIN 100000 UNIT/ML MT SUSP
5.0000 mL | Freq: Four times a day (QID) | OROMUCOSAL | Status: DC
  Administered 2020-02-22 – 2020-02-23 (×4): 500000 [IU] via OROMUCOSAL
  Filled 2020-02-22 (×4): qty 5

## 2020-02-22 MED ORDER — PREDNISONE 10 MG PO TABS
10.0000 mg | ORAL_TABLET | Freq: Every day | ORAL | Status: DC
  Administered 2020-02-23: 10 mg via ORAL
  Filled 2020-02-22: qty 1

## 2020-02-22 MED ORDER — ENOXAPARIN SODIUM 40 MG/0.4ML ~~LOC~~ SOLN
40.0000 mg | SUBCUTANEOUS | Status: DC
  Administered 2020-02-22 – 2020-02-23 (×2): 40 mg via SUBCUTANEOUS
  Filled 2020-02-22 (×2): qty 0.4

## 2020-02-22 NOTE — Progress Notes (Signed)
PROGRESS NOTE    April [name]  UEA:[id number] DOB: [birth year] DOA: 02/08/2020 PCP: [name], MD   Brief Narrative:   [name] a 64 y.o.femalewith medical history significant ofPCKD s/p renal transplant; HLD; anal condyloma; and CHF presenting with SOB/nausea. She was previously hospitalized from 3/10-16 for sepsis of uncertain etiology in a transplant recipient - possibly from mucopurulent bronchitis. She had acute respiratory failure with hypoxia. ID was involved and she was treated with broad spectrum antibiotics and transitioned to IV ceftriaxone. She was recommended to go to CIR but declined and went home with home health services. She felt good when she went home - except that she was having horrible burning diarrhea "from the antibiotics." She had recurrent fever at home up to 100.4. Pt fell in January - hip and pelvic fractures as well as wrist fracture. She went to CIR but was in too much pain to be successful. She is now bed bound. Every time she starts to get more mobile, she has a setback or illness. They recommended return to CIR after last hospitalization. She declined because she didn't have the strength to keep up with the program. She refused a SNF rehab due to her immunosuppression. She continues to feel the same about disposition.   Assessment & Plan:   Principal Problem:   Sepsis ([place]) Active Problems:   Renal transplant recipient   Chronic diastolic CHF (congestive heart failure) (HCC)   Physical deconditioning   Essential hypertension   Glaucoma   SVT (supraventricular tachycardia) (HCC)   Anemia of chronic disease   Palliative care encounter   Goals of care, counseling/discussion   SIRS without source in an immunocompromised patient, uncertain etiology, POA, resolved  - Patient with recent hospitalization, discharged on 3/16 after sepsis from uncertain           source - She returned with fever, tachycardia, tachypnea  concerning for recurrent sepsis    - Bld Cx NTD - ID consulted, appreciate assistance     - 3/29 - back on home lasix - likely need prolonged steroid taper at DC  Acute hypoxic respiratory failure likely in setting of heart failure exacerbation and volume overload     -Patient diuresing well on Lasix, continue to follow I's and O's, not yet euvolemic but         markedly improving from previous     - ABG previously performed unremarkable, VQ scan negative     -Continue to wean oxygen as tolerated  Acute metabolic encephalopathy secondary to above, resolved     -Patient's mental status now back to baseline  PCKD s/p renal transplantation - continue immunosuppressants including mycophenolate and tacrolimus - continue stress dose steroids; will need to taper  HTN, essential - 3/21: d/c metoprolol and started verapamil - well controlled currently  Glaucoma/Dry eye - continue home drops  Debility, ambulator dysfunction - Since her fall in January, she has been essentially bed bound - She is quite deconditioned and this is unlikely to improve her long-term outcomes - Pt is encouraged to get OOB to chair at least q shift - Palliative care consulted, rec oupt follow up     - Plan is for SNF  Asthma, not in acute exacerbation - continue dulera, mucinex     -Appears to be well controlled  Gout - Allopurinol  Chronic spasm/pain - robaxin, gabapentin  Anxiety/depression - prozac  GERD - protonix  Normocytic anemia - no evidence of bleed - 3/23: Hgb 7.1; question symptomatic  anemia, transfused 1 unit pRBCs     - follow cbc closely   DVT prophylaxis: Lovenox Code Status: FULL Family Communication: Patient to update family  Disposition Plan: Continues as inpatient, ultimate disposition likely SNF - discussed with CIR but patient refusing admission there as she feels like she is too weak to handle their  PT regimen. Awaiting insurance approval and bed authorization for SNF placement -CM aware.  Consultants:   ID  PC  Subjective: Met patient sitting up in bed, talking on the phone.  Denies any new complaints.  Sat up in a chair for a couple of hours yesterday.  Denies any worsening shortness of breath, chest pain, abdominal pain, nausea/vomiting, fever/chills.  Objective: Vitals:   02/21/20 1258 02/21/20 1651 02/21/20 1921 02/21/20 2146  BP: 128/62 128/65  (!) 106/45  Pulse: 86 90  79  Resp: 19 19  18   Temp: 98.5 F (36.9 C) 99.1 F (37.3 C)  99 F (37.2 C)  TempSrc:    Oral  SpO2: 100% 99% 96% 94%  Weight:      Height:        Intake/Output Summary (Last 24 hours) at 02/22/2020 1405 Last data filed at 02/22/2020 0500 Gross per 24 hour  Intake --  Output 1750 ml  Net -1750 ml   Filed Weights   02/08/20 0728 02/10/20 1027  Weight: 75.5 kg 80.6 kg    Examination:  General: NAD, bilateral upper extremity tremors  Cardiovascular: S1, S2 present  Respiratory: CTAB  Abdomen: Soft, nontender, nondistended, bowel sounds present  Musculoskeletal: +bilateral pedal edema noted  Skin: Normal  Psychiatry: Normal mood    Data Reviewed: I have personally reviewed following labs and imaging studies.  CBC: Recent Labs  Lab 02/16/20 0309 02/17/20 0414 02/18/20 0202 02/19/20 0301 02/22/20 0238  WBC 7.3 5.3 5.7 5.9 6.7  NEUTROABS  --   --   --   --  6.2  HGB 7.7* 7.5* 7.8* 7.9* 8.3*  HCT 26.5* 25.9* 27.6* 27.7* 29.3*  MCV 95.0 95.9 97.9 96.9 98.3  PLT 163 165 178 143* 253   Basic Metabolic Panel: Recent Labs  Lab 02/16/20 0309 02/17/20 0414 02/18/20 0202 02/19/20 0301 02/22/20 0238  NA 137 138 135 142 142  K 4.5 4.1 4.6 5.3* 4.6  CL 98 101 97* 100 100  CO2 25 27 27  32 31  GLUCOSE 198* 184* 166* 172* 144*  BUN 46* 48* 51* 47* 32*  CREATININE 1.31* 1.24* 1.16* 1.04* 0.94  CALCIUM 8.5* 8.4* 8.8* 9.0 9.1   GFR: Estimated Creatinine Clearance: 61 mL/min  (by C-G formula based on SCr of 0.94 mg/dL). Liver Function Tests: Recent Labs  Lab 02/16/20 0309 02/17/20 0414 02/18/20 0202 02/19/20 0301  AST 13* 13* 18 16  ALT 14 14 17 18   ALKPHOS 88 75 78 71  BILITOT 1.1 0.9 1.0 1.2  PROT 6.1* 6.0* 5.7* 5.6*  ALBUMIN 2.1* 2.2* 2.3* 2.3*   Coagulation Profile: No results for input(s): INR, PROTIME in the last 168 hours. Sepsis Labs: No results for input(s): PROCALCITON, LATICACIDVEN in the last 168 hours.  No results found for this or any previous visit (from the past 240 hour(s)).    Radiology Studies: No results found.   Scheduled Meds: . allopurinol  300 mg Oral Daily  . aspirin  81 mg Oral Daily  . brimonidine  1 drop Left Eye BID  . Chlorhexidine Gluconate Cloth  6 each Topical Daily  . clonazepam  0.25 mg Oral BID  .  cycloSPORINE  1 drop Both Eyes BID  . dorzolamide  1 drop Both Eyes Daily  . feeding supplement (ENSURE ENLIVE)  237 mL Oral BID BM  . FLUoxetine  40 mg Oral Daily  . furosemide  40 mg Oral Daily  . gabapentin  200 mg Oral TID  . guaiFENesin  600 mg Oral BID  . latanoprost  1 drop Both Eyes QHS  . magnesium oxide  400 mg Oral BID  . methocarbamol  500 mg Oral QID  . mometasone-formoterol  2 puff Inhalation BID  . multivitamin with minerals  1 tablet Oral Daily  . mycophenolate  180 mg Oral BID  . pantoprazole  40 mg Oral BID  . predniSONE  20 mg Oral Q breakfast  . saccharomyces boulardii  250 mg Oral BID  . sodium chloride flush  3 mL Intravenous Q12H  . tacrolimus  0.5 mg Oral QHS  . tacrolimus  1 mg Oral Daily  . verapamil  180 mg Oral Daily   Continuous Infusions:    LOS: 14 days   Time spent: 63mnutes  NAlma Friendly MD [facility]  If 7PM-7AM, please contact night-coverage [email or web address] 02/22/2020, 2:05 PM

## 2020-02-23 LAB — CBC WITH DIFFERENTIAL/PLATELET
Abs Immature Granulocytes: 0.04 10*3/uL (ref 0.00–0.07)
Basophils Absolute: 0 10*3/uL (ref 0.0–0.1)
Basophils Relative: 0 %
Eosinophils Absolute: 0.1 10*3/uL (ref 0.0–0.5)
Eosinophils Relative: 1 %
HCT: 29.6 % — ABNORMAL LOW (ref 36.0–46.0)
Hemoglobin: 8.6 g/dL — ABNORMAL LOW (ref 12.0–15.0)
Immature Granulocytes: 1 %
Lymphocytes Relative: 10 %
Lymphs Abs: 0.7 10*3/uL (ref 0.7–4.0)
MCH: 27.9 pg (ref 26.0–34.0)
MCHC: 29.1 g/dL — ABNORMAL LOW (ref 30.0–36.0)
MCV: 96.1 fL (ref 80.0–100.0)
Monocytes Absolute: 0.3 10*3/uL (ref 0.1–1.0)
Monocytes Relative: 4 %
Neutro Abs: 6.2 10*3/uL (ref 1.7–7.7)
Neutrophils Relative %: 84 %
Platelets: 172 10*3/uL (ref 150–400)
RBC: 3.08 MIL/uL — ABNORMAL LOW (ref 3.87–5.11)
RDW: 21.7 % — ABNORMAL HIGH (ref 11.5–15.5)
WBC: 7.3 10*3/uL (ref 4.0–10.5)
nRBC: 0 % (ref 0.0–0.2)

## 2020-02-23 LAB — BASIC METABOLIC PANEL
Anion gap: 12 (ref 5–15)
BUN: 30 mg/dL — ABNORMAL HIGH (ref 8–23)
CO2: 32 mmol/L (ref 22–32)
Calcium: 9.3 mg/dL (ref 8.9–10.3)
Chloride: 95 mmol/L — ABNORMAL LOW (ref 98–111)
Creatinine, Ser: 0.92 mg/dL (ref 0.44–1.00)
GFR calc Af Amer: >60 mL/min (ref >60)
GFR calc non Af Amer: >60 mL/min (ref >60)
Glucose, Bld: 132 mg/dL — ABNORMAL HIGH (ref 70–99)
Potassium: 4.3 mmol/L (ref 3.5–5.1)
Sodium: 139 mmol/L (ref 135–145)

## 2020-02-23 MED ORDER — ENSURE ENLIVE PO LIQD: 237.0000 mL | Freq: Two times a day (BID) | ORAL | 12 refills | Status: DC

## 2020-02-23 MED ORDER — CLONAZEPAM 0.25 MG PO TBDP: 0.2500 mg | ORAL_TABLET | Freq: Two times a day (BID) | ORAL | 0 refills | Status: DC | PRN

## 2020-02-23 MED ORDER — VERAPAMIL HCL ER 180 MG PO TBCR: 180.0000 mg | EXTENDED_RELEASE_TABLET | Freq: Every day | ORAL | Status: DC

## 2020-02-23 MED ORDER — NYSTATIN 100000 UNIT/ML MT SUSP: 5.0000 mL | Freq: Four times a day (QID) | OROMUCOSAL | 0 refills | Status: DC

## 2020-02-23 MED ORDER — ADULT MULTIVITAMIN W/MINERALS CH: 1.0000 | ORAL_TABLET | Freq: Every day | ORAL | Status: DC

## 2020-02-23 NOTE — TOC Progression Note (Signed)
Transition of Care [facility]) - Progression Note    Patient Details  Name: RICKIYA PICARIELLO MRN: [id number] Date of Birth: 1956-10-25  Transition of [facility] CM/SW [place], RN Phone Number: 02/23/2020, 12:31 PM  Clinical Narrative:    Patient to be discharged to [facility]. Patient and husband have been updated and are ok with the plan. Receiving facility has been updatede and will meet with husband at 1pm to sign paper work. [facility] transport has been setup and bedside nurse has been notified.   Call report to-  [place] # [address] [phone]      Expected Discharge Plan: Home/Self Care Barriers to Discharge: No Barriers Identified  Expected Discharge Plan and Services Expected Discharge Plan: Home/Self Care In-house Referral: NA Discharge Planning Services: CM Consult   Living arrangements for the past 2 months: Single Family Home(2 story home but can maintain on the first level) Expected Discharge Date: 02/23/20                         HH Arranged: ([place] active prior to admit and will continue upon discharge) [place] Agency: [facility] (now [facility] prior to admit)         Social Determinants of Health (SDOH) Interventions    Readmission Risk Interventions Readmission Risk Prevention Plan 02/09/2020 04/20/2019  Transportation Screening Complete Complete  PCP or Specialist Appt within 3-5 Days - Not Complete  Not Complete comments - not yet ready for d/c  HRI or [name] - Not Complete  HRI or Home Care Consult comments - no needs identified  Social Work Consult for [place] Planning/Counseling - Not Complete  SW consult not completed comments - no needs identified  Palliative Care Screening - Not Applicable  Medication Review (RN Care Manager) Referral to Pharmacy Complete  PCP or Specialist appointment within 3-5 days of discharge Complete -  [place] or Home Care Consult Complete -  [place] Patient Refused -  Some recent data might be hidden

## 2020-02-23 NOTE — Plan of Care (Signed)

## 2020-02-23 NOTE — Progress Notes (Signed)
Patient stated she did not want to put on the BIPAP. IF she wanted the Bipap I told her to tell the nurse to call me to help her put it on.

## 2020-02-23 NOTE — Progress Notes (Signed)
Patient was told if and when she wanted to put on her Bipap to let the nurse know or RT could come back by to put her on the Bipap and she said she would let the nurse know if and when she wanted to go on.

## 2020-02-23 NOTE — Discharge Summary (Signed)
Discharge Summary  April [name] NTZ:[id number] DOB: 03/01/1956  PCP: [name], MD  Admit date: 02/08/2020 Discharge date: 02/23/2020  Time spent: 40 mins  Recommendations for Outpatient Follow-up:  1. Follow-up with PCP  Discharge Diagnoses:  Active Hospital Problems   Diagnosis Date Noted  . Sepsis ([place]) 01/31/2020  . SVT (supraventricular tachycardia) ([place])   . Anemia of chronic disease   . Palliative care encounter   . Goals of care, counseling/discussion   . Essential hypertension 02/08/2020  . Glaucoma 02/08/2020  . Physical deconditioning 09/22/2019  . Chronic diastolic CHF (congestive heart failure) ([place]) 10/01/2017  . Renal transplant recipient 07/15/2016    Resolved Hospital Problems  No resolved problems to display.    Discharge Condition: Stable  Diet recommendation: Heart healthy  Vitals:   02/23/20 0800 02/23/20 0805  BP:  (!) 127/57  Pulse:  98  Resp:  19  Temp:  98.7 F (37.1 C)  SpO2: 94% 95%    History of present illness:  April [name] a 64 y.o.femalewith medical history significant ofPCKD s/p renal transplant; HLD; anal condyloma; and CHF presenting with SOB/nausea. She was previously hospitalized from 3/10-16 for sepsis of uncertain etiology in a transplant recipient - possibly from mucopurulent bronchitis. She had acute respiratory failure with hypoxia. ID was involved and she was treated with broad spectrum antibiotics and transitioned to IV ceftriaxone. She was recommended to go to CIR but declined and went home with home health services. She felt good when she went home - except that she was having horrible burning diarrhea "from the antibiotics." She had recurrent fever at home up to 100.4. Pt fell in January - hip and pelvic fractures as well as wrist fracture. She went to CIR but was in too much pain to be successful. She is now bed bound. Every time she starts to get more mobile, she has a setback or illness. They  recommended return to CIR after last hospitalization. She declined because she didn't have the strength to keep up with the program. She refused a SNF rehab due to her immunosuppression. She continues to feel the same about disposition.    Today, patient denies any new complaints, denies any chest pain, abdominal pain, nausea/vomiting, worsening shortness of breath, fever/chills.  Patient stable to be transferred to SNF for further rehab needs    Hospital Course:  Principal Problem:   Sepsis [facility]) Active Problems:   Renal transplant recipient   Chronic diastolic CHF (congestive heart failure) ([facility])   Physical deconditioning   Essential hypertension   Glaucoma   SVT (supraventricular tachycardia) ([facility])   Anemia of chronic disease   Palliative care encounter   Goals of care, counseling/discussion   SIRS without source in an immunocompromised patient, uncertain etiology, POA, Resolved Currently afebrile with no leukocytosis    - Bld Cx NGTD  Acute hypoxic respiratory failure likely in setting of heart failure exacerbation and volume overload Improved ABG previously performed unremarkable, VQ scan negative Continue Lasix  Acute metabolic encephalopathy secondary to above Resolved  PCKD s/p renal transplantation - continue immunosuppressants including mycophenolate, tacrolimus and prednisone  HTN, essential - 3/21: d/c metoprolol and started verapamil - well controlled currently  Glaucoma/Dry eye - continue home drops  Debility, ambulator dysfunction/GOC discussion - Since her fall in January, she has been essentially bed bound - She is quite deconditioned and this is unlikely to improve her long-term outcomes - Pt is encouraged to get OOB to chair at least q shift - Palliative  care consulted, rec oupt follow up  Asthma, not in acute exacerbation - continue dulera, mucinex  Gout - Allopurinol  Chronic  spasm/pain - robaxin, gabapentin  Anxiety/depression - prozac  GERD - protonix  Normocytic anemia Stable posttransfusion - no evidence of bleed - 3/23: Hgb 7.1; question symptomatic anemia, transfused 1 unit pRBCs       Malnutrition Type:  Nutrition Problem: Inadequate oral intake Etiology: decreased appetite   Malnutrition Characteristics:  Signs/Symptoms: per patient/family report   Nutrition Interventions:  Interventions: Ensure Enlive (each supplement provides 350kcal and 20 grams of protein), MVI   Estimated body mass index is 32.5 kg/m as calculated from the following:   Height as of this encounter: 5\' 2"  (1.575 m).   Weight as of this encounter: 80.6 kg.    Procedures:  None  Consultations:  ID  Palliative care  Discharge Exam: BP (!) 127/57 (BP Location: Right Arm)   Pulse 98   Temp 98.7 F (37.1 C)   Resp 19   Ht 5\' 2"  (1.575 m)   Wt 80.6 kg   SpO2 95%   BMI 32.50 kg/m   General: NAD, noted generalized tremors Cardiovascular: S1, S2 present Respiratory: CTA B  Discharge Instructions You were cared for by a hospitalist during your hospital stay. If you have any questions about your discharge medications or the care you received while you were in the hospital after you are discharged, you can call the unit and asked to speak with the hospitalist on call if the hospitalist that took care of you is not available. Once you are discharged, your primary care physician will handle any further medical issues. Please note that NO REFILLS for any discharge medications will be authorized once you are discharged, as it is imperative that you return to your primary care physician (or establish a relationship with a primary care physician if you do not have one) for your aftercare needs so that they can reassess your need for medications and monitor your lab values.  Discharge Instructions    Diet - low sodium heart healthy    Complete by: As directed    Increase activity slowly   Complete by: As directed      Allergies as of 02/23/2020      Reactions   Infed [iron Dextran] Other (See Comments)   Chest tightness   Pentamidine Itching, Shortness Of Breath, Swelling   Erythromycin [erythromycin] Other (See Comments)   Mouth Ulcers   Iohexol Other (See Comments)   Per patient "she has had a kidney transplant and should never have contrast"   Oxycodone Nausea Only, Nausea And Vomiting   Erythromycin Rash   Causes breakout in mouth   Ultram [tramadol Hcl] Anxiety      Medication List    STOP taking these medications   amoxicillin-clavulanate 875-125 MG tablet Commonly known as: AUGMENTIN   calcium carbonate 500 MG chewable tablet Commonly known as: TUMS - dosed in mg elemental calcium   HYDROcodone-acetaminophen 5-325 MG tablet Commonly known as: NORCO/VICODIN   metoprolol tartrate 25 MG tablet Commonly known as: LOPRESSOR     TAKE these medications   acetaminophen 325 MG tablet Commonly known as: TYLENOL Take 650 mg by mouth every 6 (six) hours as needed for mild pain or headache.   albuterol 108 (90 Base) MCG/ACT inhaler Commonly known as: VENTOLIN HFA Inhale 2 puffs into the lungs every 4 (four) hours as needed for wheezing or shortness of breath.   allopurinol 300 MG tablet  Commonly known as: ZYLOPRIM Take 1 tablet (300 mg total) by mouth daily.   aspirin 81 MG chewable tablet Chew 81 mg by mouth daily.   brimonidine 0.1 % Soln Commonly known as: ALPHAGAN P Place 1 drop 2 (two) times daily into the left eye.   cholecalciferol 25 MCG (1000 UNIT) tablet Commonly known as: VITAMIN D3 Take 1 tablet (1,000 Units total) by mouth daily.   clonazePAM 0.25 MG disintegrating tablet Commonly known as: KLONOPIN Take 1 tablet (0.25 mg total) by mouth 2 (two) times daily as needed (Anxiety).   cycloSPORINE 0.05 % ophthalmic emulsion Commonly known as: RESTASIS Place 1 drop into both eyes 2  (two) times daily.   docusate sodium 100 MG capsule Commonly known as: COLACE Take 1 capsule (100 mg total) by mouth 2 (two) times daily. What changed:   when to take this  reasons to take this   dorzolamide 2 % ophthalmic solution Commonly known as: TRUSOPT Place 1 drop into both eyes daily.   feeding supplement (ENSURE ENLIVE) Liqd Take 237 mLs by mouth 2 (two) times daily between meals.   FLUoxetine 40 MG capsule Commonly known as: PROZAC Take 1 capsule (40 mg total) by mouth daily.   furosemide 40 MG tablet Commonly known as: LASIX Take 1 tablet (40 mg total) by mouth daily.   gabapentin 100 MG capsule Commonly known as: NEURONTIN Take 200 mg by mouth 3 (three) times daily.   guaiFENesin 600 MG 12 hr tablet Commonly known as: MUCINEX Take 1 tablet (600 mg total) by mouth 2 (two) times daily.   lidocaine 5 % Commonly known as: LIDODERM Place 1 patch onto the skin daily. Remove & Discard patch within 12 hours or as directed by MD   magnesium oxide 400 MG tablet Commonly known as: MAG-OX Take 1 tablet (400 mg total) by mouth 2 (two) times daily.   methocarbamol 500 MG tablet Commonly known as: ROBAXIN Take 1 tablet (500 mg total) by mouth 4 (four) times daily.   multivitamin with minerals Tabs tablet Take 1 tablet by mouth daily. Start taking on: February 24, 2020   mycophenolate 180 MG EC tablet Commonly known as: MYFORTIC Take 180 mg by mouth 2 (two) times daily.   nystatin 100000 UNIT/ML suspension Commonly known as: MYCOSTATIN Use as directed 5 mLs (500,000 Units total) in the mouth or throat 4 (four) times daily.   pantoprazole 40 MG tablet Commonly known as: PROTONIX Take 1 tablet (40 mg total) by mouth 2 (two) times daily.   polyethylene glycol 17 g packet Commonly known as: MIRALAX / GLYCOLAX Take 17 g by mouth daily as needed for mild constipation.   predniSONE 5 MG tablet Commonly known as: DELTASONE Take 2.5-5 mg by mouth See admin  instructions. 5 mg in the morning, 2.5 mg in the evening   saccharomyces boulardii 250 MG capsule Commonly known as: FLORASTOR Take 1 capsule (250 mg total) by mouth 2 (two) times daily.   sodium chloride 0.65 % Soln nasal spray Commonly known as: OCEAN Place 1 spray into both nostrils as needed for congestion.   Symbicort 160-4.5 MCG/ACT inhaler Generic drug: budesonide-formoterol Inhale 2 puffs into the lungs 2 (two) times daily.   tacrolimus 0.5 MG capsule Commonly known as: PROGRAF Take 0.5 mg by mouth at bedtime. Takes brand name Prograf   Prograf 1 MG capsule Generic drug: tacrolimus Take 1 mg by mouth daily.   Travatan Z 0.004 % Soln ophthalmic solution Generic drug: Travoprost (BAK Free)  Place 1 drop into both eyes at bedtime.   verapamil 180 MG CR tablet Commonly known as: CALAN-SR Take 1 tablet (180 mg total) by mouth daily.   vitamin B-12 100 MCG tablet Commonly known as: CYANOCOBALAMIN Take 100 mcg by mouth daily.      Allergies  Allergen Reactions  . Infed [Iron Dextran] Other (See Comments)    Chest tightness  . Pentamidine Itching, Shortness Of Breath and Swelling  . Erythromycin [Erythromycin] Other (See Comments)    Mouth Ulcers  . Iohexol Other (See Comments)    Per patient "she has had a kidney transplant and should never have contrast"  . Oxycodone Nausea Only and Nausea And Vomiting  . Erythromycin Rash    Causes breakout in mouth  . Ultram [Tramadol Hcl] Anxiety    Contact information for follow-up providers    [name], MD. Schedule an appointment as soon as possible for a visit.   Specialty: Family Medicine Contact information: [place] [address] [phone]            Contact information for after-discharge care    Destination    [facility] .   Service: Skilled Nursing Contact information: [address] [place] [phone]                   The  results of significant diagnostics from this hospitalization (including imaging, microbiology, ancillary and laboratory) are listed below for reference.    Significant Diagnostic Studies: DG Chest 2 View  Result Date: 02/02/2020 CLINICAL DATA:  Dyspnea. EXAM: CHEST - 2 VIEW COMPARISON:  January 31, 2020. FINDINGS: Stable cardiomediastinal silhouette. No pneumothorax or pleural effusion is noted. Left lung is clear. Minimal right basilar subsegmental atelectasis is noted. The visualized skeletal structures are unremarkable. IMPRESSION: Minimal right basilar subsegmental atelectasis. Electronically Signed   By: Marijo Conception M.D.   On: 02/02/2020 14:25   NM Pulmonary Perfusion  Result Date: 02/13/2020 CLINICAL DATA:  PE suspected.  Positive D-dimer. EXAM: NUCLEAR MEDICINE PERFUSION LUNG SCAN TECHNIQUE: Perfusion images were obtained in multiple projections after intravenous injection of radiopharmaceutical. Ventilation scans intentionally deferred if perfusion scan and chest x-ray adequate for interpretation during COVID 19 epidemic. RADIOPHARMACEUTICALS:  1.7 mCi Tc-76m MAA IV COMPARISON:  Chest radiograph 04/19/2019 FINDINGS: No wedge-shaped peripheral perfusion defects. No segmental defects. No evidence acute pulmonary embolism. IMPRESSION: No evidence of acute pulmonary embolism. Electronically Signed   By: Suzy Bouchard M.D.   On: 02/13/2020 11:39   NM Pulmonary Perfusion  Result Date: 02/02/2020 CLINICAL DATA:  Shortness of breath EXAM: NUCLEAR MEDICINE PERFUSION LUNG SCAN TECHNIQUE: Perfusion images were obtained in multiple projections after intravenous injection of radiopharmaceutical. Ventilation scans intentionally deferred if perfusion scan and chest x-ray adequate for interpretation during COVID 19 epidemic. Views: Anterior, posterior, left lateral, right lateral, RPO, LPO, RAO, LAO RADIOPHARMACEUTICALS:  1.55 mCi Tc-29m MAA IV COMPARISON:  Chest radiograph January 31, 2020 FINDINGS:  Radiotracer uptake bilaterally is homogeneous and symmetric. No focal perfusion defects evident. IMPRESSION: No perfusion defects evident. Very low probability of pulmonary embolus. Electronically Signed   By: Lowella Grip III M.D.   On: 02/02/2020 13:34   DG CHEST PORT 1 VIEW  Result Date: 02/17/2020 CLINICAL DATA:  Shortness of breath EXAM: PORTABLE CHEST 1 VIEW COMPARISON:  02/14/2020 FINDINGS: Bilateral mild interstitial thickening. No pleural effusion or pneumothorax. Stable cardiomegaly. No aggressive osseous lesion. IMPRESSION: Cardiomegaly with mild pulmonary vascular congestion. Electronically Signed  By: Kathreen Devoid   On: 02/17/2020 04:11   DG Chest Port 1 View  Result Date: 02/14/2020 CLINICAL DATA:  Wheezing EXAM: PORTABLE CHEST 1 VIEW COMPARISON:  February 12, 2020. FINDINGS: There is no evident edema or airspace opacity. Suspected small right pleural effusion. Heart size and pulmonary vascularity are normal. No adenopathy. No bone lesions. IMPRESSION: Small right pleural effusion, similar to recent study. Lungs otherwise clear. Cardiac silhouette within normal limits. Electronically Signed   By: Lowella Grip III M.D.   On: 02/14/2020 10:00   DG CHEST PORT 1 VIEW  Result Date: 02/12/2020 CLINICAL DATA:  Polycystic kidney disease status post renal transplant, short of breath, nausea, cough EXAM: PORTABLE CHEST 1 VIEW COMPARISON:  02/11/2020 FINDINGS: Single frontal view of the chest demonstrates a stable cardiac silhouette. Small right pleural effusion has developed in the interim. No airspace disease or pneumothorax. Mild central vascular congestion unchanged. No acute bony abnormalities. IMPRESSION: 1. Chronic central vascular congestion. 2. Trace right pleural effusion. Electronically Signed   By: Randa Ngo M.D.   On: 02/12/2020 18:12   DG CHEST PORT 1 VIEW  Result Date: 02/11/2020 CLINICAL DATA:  Sepsis.  Shortness of breath. EXAM: PORTABLE CHEST 1 VIEW COMPARISON:   February 08, 2020 FINDINGS: Stable cardiomegaly. No pneumothorax. No nodules or masses. No focal infiltrates. IMPRESSION: No active disease. Electronically Signed   By: Dorise Bullion III M.D   On: 02/11/2020 10:27   DG Chest Port 1 View  Result Date: 02/08/2020 CLINICAL DATA:  Fever EXAM: PORTABLE CHEST 1 VIEW COMPARISON:  Three days ago FINDINGS: Minimal linear opacity that is stable and compatible with scarring. Stable low lung volumes. Normal heart size. There is no edema, consolidation, effusion, or pneumothorax. IMPRESSION: Stable from 3 days ago.  No focal pneumonia. Electronically Signed   By: Monte Fantasia M.D.   On: 02/08/2020 04:52   DG CHEST PORT 1 VIEW  Result Date: 02/05/2020 CLINICAL DATA:  64 year old female with history of shortness of breath. EXAM: PORTABLE CHEST 1 VIEW COMPARISON:  Chest x-ray 02/04/2020. FINDINGS: Lung volumes are low. No consolidative airspace disease. No pleural effusions. No evidence of pulmonary edema. No pneumothorax. Heart size is mildly enlarged. Upper mediastinal contours are within normal limits. IMPRESSION: 1. No radiographic evidence of acute cardiopulmonary disease. 2. Mild cardiomegaly. Electronically Signed   By: Vinnie Langton M.D.   On: 02/05/2020 08:05   DG CHEST PORT 1 VIEW  Result Date: 02/04/2020 CLINICAL DATA:  Respiratory distress.  Short of breath. EXAM: PORTABLE CHEST 1 VIEW COMPARISON:  02/03/2020 and older exams. FINDINGS: Cardiac silhouette normal in size.  No mediastinal or hilar masses. Linear opacities are noted at the lung bases consistent with scarring or atelectasis, stable. Remainder of the lungs is clear. No convincing pleural effusion.  No pneumothorax. Skeletal structures are demineralized but grossly intact. IMPRESSION: 1. No acute cardiopulmonary disease and no change from the previous day's exam. Electronically Signed   By: Lajean Manes M.D.   On: 02/04/2020 08:37   DG CHEST PORT 1 VIEW  Result Date: 02/03/2020 CLINICAL  DATA:  Shortness of breath EXAM: PORTABLE CHEST 1 VIEW COMPARISON:  02/02/2020 FINDINGS: Mild right basilar opacity, likely atelectasis. Lungs otherwise clear. No pleural effusion or pneumothorax. Mild cardiomegaly. IMPRESSION: No evidence of acute cardiopulmonary disease. Electronically Signed   By: Julian Hy M.D.   On: 02/03/2020 09:21   DG Chest Port 1 View  Result Date: 01/31/2020 CLINICAL DATA:  Dyspnea. EXAM: PORTABLE CHEST 1 VIEW  COMPARISON:  January 07, 2020. FINDINGS: Inspiration remains shallow. There is stable cardiomegaly with central pulmonary vascular congestion, trace left pleural effusion and mild pulmonary edema. Telemetry leads preclude evaluation of the right lateral costophrenic angle for any residual right pleural effusion. Trace fluid continues to layer along the minor fissure. Bilateral infrahilar streaky consolidation probably represents bronchitis. There is no convincing evidence for pneumonia. There is no pneumothorax. Diffuse bone demineralization and mild skeletal degenerative changes are present. There are telemetry leads. IMPRESSION: Shallow inspiration with stable cardiomegaly, mild pulmonary edema and trace bilateral pleural effusion. Streaky infrahilar consolidation, probably a nonspecific bronchitis. No pneumonia. Electronically Signed   By: Revonda Humphrey   On: 01/31/2020 21:17   DG HIP UNILAT WITH PELVIS 2-3 VIEWS LEFT  Result Date: 02/01/2020 CLINICAL DATA:  Left hip pain for 1 day, no known injury, initial encounter EXAM: DG HIP (WITH OR WITHOUT PELVIS) 3V LEFT COMPARISON:  12/21/2019 FINDINGS: Left hip replacement is again seen and stable. The greater trochanter fracture fragment is again seen and slightly displaced when compare with the prior exam. Prior left pubic rami fractures are again noted. Callus formation in these fractures. No soft tissue abnormality is seen. IMPRESSION: Changes consistent with prior left hip replacement. Healing pubic rami fractures  on the left are noted. Increased fragmentation of the greater trochanter fragment seen on the prior exam. Electronically Signed   By: Inez Catalina M.D.   On: 02/01/2020 21:41   VAS [place] LOWER EXTREMITY VENOUS (DVT)  Result Date: 02/03/2020  Lower Venous DVTStudy Indications: Edema.  Limitations: Poor ultrasound/tissue interface. Comparison Study: 12/16/2019-negative lower extremity venous duplex Performing Technologist: Maudry Mayhew MHA, RDMS, RVT, RDCS  Examination Guidelines: A complete evaluation includes B-mode imaging, spectral Doppler, color Doppler, and power Doppler as needed of all accessible portions of each vessel. Bilateral testing is considered an integral part of a complete examination. Limited examinations for reoccurring indications may be performed as noted. The reflux portion of the exam is performed with the patient in reverse Trendelenburg.  +---------+---------------+---------+-----------+----------+--------------+ LEFT     CompressibilityPhasicitySpontaneityPropertiesThrombus Aging +---------+---------------+---------+-----------+----------+--------------+ CFV      Full           Yes      Yes                                 +---------+---------------+---------+-----------+----------+--------------+ SFJ      Full                                                        +---------+---------------+---------+-----------+----------+--------------+ FV Prox  Full                                                        +---------+---------------+---------+-----------+----------+--------------+ FV Mid   Full                                                        +---------+---------------+---------+-----------+----------+--------------+ FV DistalFull                                                        +---------+---------------+---------+-----------+----------+--------------+  PFV      Full                                                         +---------+---------------+---------+-----------+----------+--------------+ POP      Full           Yes      Yes                                 +---------+---------------+---------+-----------+----------+--------------+ PTV      Full                                                        +---------+---------------+---------+-----------+----------+--------------+ PERO     Full                                                        +---------+---------------+---------+-----------+----------+--------------+     Summary: LEFT: - There is no evidence of deep vein thrombosis in the lower extremity.  - No cystic structure found in the popliteal fossa.  *See table(s) above for measurements and observations. Electronically signed by Harold Barban MD on 02/03/2020 at 7:08:44 PM.    Final     Microbiology: Recent Results (from the past 240 hour(s))  SARS CORONAVIRUS 2 (TAT 6-24 HRS) Nasopharyngeal Nasopharyngeal Swab     Status: None   Collection Time: 02/22/20  4:14 PM   Specimen: Nasopharyngeal Swab  Result Value Ref Range Status   SARS Coronavirus 2 NEGATIVE NEGATIVE Final    Comment: (NOTE) SARS-CoV-2 target nucleic acids are NOT DETECTED. The SARS-CoV-2 RNA is generally detectable in upper and lower respiratory specimens during the acute phase of infection. Negative results do not preclude SARS-CoV-2 infection, do not rule out co-infections with other pathogens, and should not be used as the sole basis for treatment or other patient management decisions. Negative results must be combined with clinical observations, patient history, and epidemiological information. The expected result is Negative. Fact Sheet for Patients: SugarRoll.be Fact Sheet for Healthcare Providers: https://www.woods-mathews.com/ This test is not yet approved or cleared by the [place] FDA and  has been authorized for detection and/or diagnosis of SARS-CoV-2  by FDA under an Emergency Use Authorization (EUA). This EUA will remain  in effect (meaning this test can be used) for the duration of the COVID-19 declaration under Section 56 4(b)(1) of the Act, 21 U.S.C. section 360bbb-3(b)(1), unless the authorization is terminated or revoked sooner. Performed at [facility], [place] [address], [place], [place] [address]      Labs: Basic Metabolic Panel: Recent Labs  Lab 02/17/20 0414 02/18/20 0202 02/19/20 0301 02/22/20 0238 02/23/20 0255  NA 138 135 142 142 139  K 4.1 4.6 5.3* 4.6 4.3  CL 101 97* 100 100 95*  CO2 27 27 32 31 32  GLUCOSE 184* 166* 172* 144* 132*  BUN 48* 51* 47* 32* 30*  CREATININE 1.24* 1.16* 1.04* 0.94 0.92  CALCIUM 8.4* 8.8* 9.0 9.1 9.3   Liver Function Tests: Recent Labs  Lab 02/17/20 0414 02/18/20 0202 02/19/20 0301  AST 13* 18 16  ALT 14 17 18   ALKPHOS 75 78 71  BILITOT 0.9 1.0 1.2  PROT 6.0* 5.7* 5.6*  ALBUMIN 2.2* 2.3* 2.3*   No results for input(s): LIPASE, AMYLASE in the last 168 hours. No results for input(s): AMMONIA in the last 168 hours. CBC: Recent Labs  Lab 02/17/20 0414 02/18/20 0202 02/19/20 0301 02/22/20 0238 02/23/20 0255  WBC 5.3 5.7 5.9 6.7 7.3  NEUTROABS  --   --   --  6.2 6.2  HGB 7.5* 7.8* 7.9* 8.3* 8.6*  HCT 25.9* 27.6* 27.7* 29.3* 29.6*  MCV 95.9 97.9 96.9 98.3 96.1  PLT 165 178 143* 173 172   Cardiac Enzymes: No results for input(s): CKTOTAL, CKMB, CKMBINDEX, TROPONINI in the last 168 hours. BNP: BNP (last 3 results) Recent Labs    01/07/20 1629 01/31/20 2007 02/08/20 0436  BNP 535.4* 574.8* 459.3*    ProBNP (last 3 results) No results for input(s): PROBNP in the last 8760 hours.  CBG: No results for input(s): GLUCAP in the last 168 hours.     Signed:  Alma Friendly, MD [facility] 02/23/2020, 11:13 AM

## 2020-02-26 ENCOUNTER — Ambulatory Visit: Payer: Self-pay | Admitting: Cardiology

## 2020-03-01 DIAGNOSIS — F419 Anxiety disorder, unspecified: Secondary | ICD-10-CM

## 2020-03-01 DIAGNOSIS — B001 Herpesviral vesicular dermatitis: Secondary | ICD-10-CM | POA: Insufficient documentation

## 2020-03-05 DIAGNOSIS — B259 Cytomegaloviral disease, unspecified: Secondary | ICD-10-CM | POA: Insufficient documentation

## 2020-03-13 ENCOUNTER — Inpatient Hospital Stay (HOSPITAL_COMMUNITY)
Admission: EM | Admit: 2020-03-13 | Discharge: 2020-03-18 | DRG: 190 | Disposition: A | Discharge status: Skilled Nursing Facility | Payer: Medicare Other | Source: Skilled Nursing Facility | Attending: Internal Medicine | Admitting: Internal Medicine

## 2020-03-13 ENCOUNTER — Emergency Department (HOSPITAL_COMMUNITY): Payer: Medicare Other

## 2020-03-13 ENCOUNTER — Other Ambulatory Visit: Payer: Self-pay

## 2020-03-13 DIAGNOSIS — Z9981 Dependence on supplemental oxygen: Secondary | ICD-10-CM | POA: Diagnosis not present

## 2020-03-13 DIAGNOSIS — I110 Hypertensive heart disease with heart failure: Secondary | ICD-10-CM | POA: Diagnosis present

## 2020-03-13 DIAGNOSIS — E785 Hyperlipidemia, unspecified: Secondary | ICD-10-CM | POA: Diagnosis present

## 2020-03-13 DIAGNOSIS — D638 Anemia in other chronic diseases classified elsewhere: Secondary | ICD-10-CM | POA: Diagnosis present

## 2020-03-13 DIAGNOSIS — F329 Major depressive disorder, single episode, unspecified: Secondary | ICD-10-CM | POA: Diagnosis present

## 2020-03-13 DIAGNOSIS — Z7951 Long term (current) use of inhaled steroids: Secondary | ICD-10-CM | POA: Diagnosis not present

## 2020-03-13 DIAGNOSIS — J189 Pneumonia, unspecified organism: Secondary | ICD-10-CM | POA: Diagnosis present

## 2020-03-13 DIAGNOSIS — A419 Sepsis, unspecified organism: Principal | ICD-10-CM

## 2020-03-13 DIAGNOSIS — Z881 Allergy status to other antibiotic agents status: Secondary | ICD-10-CM

## 2020-03-13 DIAGNOSIS — I471 Supraventricular tachycardia, unspecified: Secondary | ICD-10-CM | POA: Diagnosis present

## 2020-03-13 DIAGNOSIS — J9621 Acute and chronic respiratory failure with hypoxia: Secondary | ICD-10-CM

## 2020-03-13 DIAGNOSIS — Z7952 Long term (current) use of systemic steroids: Secondary | ICD-10-CM

## 2020-03-13 DIAGNOSIS — Z20822 Contact with and (suspected) exposure to covid-19: Secondary | ICD-10-CM | POA: Diagnosis present

## 2020-03-13 DIAGNOSIS — K219 Gastro-esophageal reflux disease without esophagitis: Secondary | ICD-10-CM | POA: Diagnosis present

## 2020-03-13 DIAGNOSIS — J441 Chronic obstructive pulmonary disease with (acute) exacerbation: Principal | ICD-10-CM | POA: Diagnosis present

## 2020-03-13 DIAGNOSIS — Z8249 Family history of ischemic heart disease and other diseases of the circulatory system: Secondary | ICD-10-CM

## 2020-03-13 DIAGNOSIS — M62838 Other muscle spasm: Secondary | ICD-10-CM | POA: Diagnosis present

## 2020-03-13 DIAGNOSIS — D84821 Immunodeficiency due to drugs: Secondary | ICD-10-CM | POA: Diagnosis present

## 2020-03-13 DIAGNOSIS — Z888 Allergy status to other drugs, medicaments and biological substances status: Secondary | ICD-10-CM | POA: Diagnosis not present

## 2020-03-13 DIAGNOSIS — Z940 Kidney transplant status: Secondary | ICD-10-CM

## 2020-03-13 DIAGNOSIS — Z8271 Family history of polycystic kidney: Secondary | ICD-10-CM

## 2020-03-13 DIAGNOSIS — F419 Anxiety disorder, unspecified: Secondary | ICD-10-CM | POA: Diagnosis present

## 2020-03-13 DIAGNOSIS — J411 Mucopurulent chronic bronchitis: Secondary | ICD-10-CM | POA: Diagnosis not present

## 2020-03-13 DIAGNOSIS — J4520 Mild intermittent asthma, uncomplicated: Secondary | ICD-10-CM | POA: Diagnosis present

## 2020-03-13 DIAGNOSIS — I5032 Chronic diastolic (congestive) heart failure: Secondary | ICD-10-CM | POA: Diagnosis present

## 2020-03-13 DIAGNOSIS — Z87891 Personal history of nicotine dependence: Secondary | ICD-10-CM | POA: Diagnosis not present

## 2020-03-13 DIAGNOSIS — R609 Edema, unspecified: Secondary | ICD-10-CM | POA: Diagnosis not present

## 2020-03-13 DIAGNOSIS — Z79899 Other long term (current) drug therapy: Secondary | ICD-10-CM

## 2020-03-13 DIAGNOSIS — I10 Essential (primary) hypertension: Secondary | ICD-10-CM | POA: Diagnosis present

## 2020-03-13 DIAGNOSIS — Z96642 Presence of left artificial hip joint: Secondary | ICD-10-CM | POA: Diagnosis present

## 2020-03-13 DIAGNOSIS — H409 Unspecified glaucoma: Secondary | ICD-10-CM | POA: Diagnosis present

## 2020-03-13 DIAGNOSIS — E782 Mixed hyperlipidemia: Secondary | ICD-10-CM | POA: Diagnosis present

## 2020-03-13 DIAGNOSIS — Z7982 Long term (current) use of aspirin: Secondary | ICD-10-CM | POA: Diagnosis not present

## 2020-03-13 DIAGNOSIS — F32A Depression, unspecified: Secondary | ICD-10-CM | POA: Diagnosis present

## 2020-03-13 LAB — URINALYSIS, ROUTINE W REFLEX MICROSCOPIC
Bilirubin Urine: NEGATIVE
Glucose, UA: NEGATIVE mg/dL
Ketones, ur: NEGATIVE mg/dL
Nitrite: NEGATIVE
Protein, ur: 100 mg/dL — AB
Specific Gravity, Urine: 1.012 (ref 1.005–1.030)
pH: 5 (ref 5.0–8.0)

## 2020-03-13 LAB — BLOOD GAS, ARTERIAL
Acid-Base Excess: 0.9 mmol/L (ref 0.0–2.0)
Bicarbonate: 26 mmol/L (ref 20.0–28.0)
Drawn by: 13746
FIO2: 28
O2 Saturation: 97.4 %
Patient temperature: 36.7
pCO2 arterial: 48.7 mmHg — ABNORMAL HIGH (ref 32.0–48.0)
pH, Arterial: 7.345 — ABNORMAL LOW (ref 7.350–7.450)
pO2, Arterial: 86 mmHg (ref 83.0–108.0)

## 2020-03-13 LAB — CBC WITH DIFFERENTIAL/PLATELET
Abs Immature Granulocytes: 0 10*3/uL (ref 0.00–0.07)
Basophils Absolute: 0.2 10*3/uL — ABNORMAL HIGH (ref 0.0–0.1)
Basophils Relative: 1 %
Eosinophils Absolute: 0 10*3/uL (ref 0.0–0.5)
Eosinophils Relative: 0 %
HCT: 33.4 % — ABNORMAL LOW (ref 36.0–46.0)
Hemoglobin: 9.5 g/dL — ABNORMAL LOW (ref 12.0–15.0)
Lymphocytes Relative: 10 %
Lymphs Abs: 2 10*3/uL (ref 0.7–4.0)
MCH: 28.2 pg (ref 26.0–34.0)
MCHC: 28.4 g/dL — ABNORMAL LOW (ref 30.0–36.0)
MCV: 99.1 fL (ref 80.0–100.0)
Monocytes Absolute: 0.8 10*3/uL (ref 0.1–1.0)
Monocytes Relative: 4 %
Neutro Abs: 17.3 10*3/uL — ABNORMAL HIGH (ref 1.7–7.7)
Neutrophils Relative %: 85 %
Platelets: 313 10*3/uL (ref 150–400)
RBC: 3.37 MIL/uL — ABNORMAL LOW (ref 3.87–5.11)
RDW: 22 % — ABNORMAL HIGH (ref 11.5–15.5)
WBC: 20.4 10*3/uL — ABNORMAL HIGH (ref 4.0–10.5)
nRBC: 0 % (ref 0.0–0.2)
nRBC: 0 /100 WBC

## 2020-03-13 LAB — COMPREHENSIVE METABOLIC PANEL
ALT: 15 U/L (ref 0–44)
AST: 28 U/L (ref 15–41)
Albumin: 2.7 g/dL — ABNORMAL LOW (ref 3.5–5.0)
Alkaline Phosphatase: 99 U/L (ref 38–126)
Anion gap: 10 (ref 5–15)
BUN: 44 mg/dL — ABNORMAL HIGH (ref 8–23)
CO2: 28 mmol/L (ref 22–32)
Calcium: 9.6 mg/dL (ref 8.9–10.3)
Chloride: 99 mmol/L (ref 98–111)
Creatinine, Ser: 0.99 mg/dL (ref 0.44–1.00)
GFR calc Af Amer: >60 mL/min (ref >60)
GFR calc non Af Amer: >60 mL/min (ref >60)
Glucose, Bld: 140 mg/dL — ABNORMAL HIGH (ref 70–99)
Potassium: 4 mmol/L (ref 3.5–5.1)
Sodium: 137 mmol/L (ref 135–145)
Total Bilirubin: 1.3 mg/dL — ABNORMAL HIGH (ref 0.3–1.2)
Total Protein: 7.8 g/dL (ref 6.5–8.1)

## 2020-03-13 LAB — MAGNESIUM: Magnesium: 1.5 mg/dL — ABNORMAL LOW (ref 1.7–2.4)

## 2020-03-13 LAB — APTT: aPTT: 33 seconds (ref 24–36)

## 2020-03-13 LAB — RESPIRATORY PANEL BY RT PCR (FLU A&B, COVID)
Influenza A by PCR: NEGATIVE
Influenza B by PCR: NEGATIVE
SARS Coronavirus 2 by RT PCR: NEGATIVE

## 2020-03-13 LAB — LACTIC ACID, PLASMA: Lactic Acid, Venous: 1.4 mmol/L (ref 0.5–1.9)

## 2020-03-13 LAB — PROTIME-INR
INR: 1.2 (ref 0.8–1.2)
Prothrombin Time: 15.2 seconds (ref 11.4–15.2)

## 2020-03-13 LAB — STREP PNEUMONIAE URINARY ANTIGEN: Strep Pneumo Urinary Antigen: NEGATIVE

## 2020-03-13 LAB — MRSA PCR SCREENING: MRSA by PCR: NEGATIVE

## 2020-03-13 LAB — BRAIN NATRIURETIC PEPTIDE: B Natriuretic Peptide: 552.6 pg/mL — ABNORMAL HIGH (ref 0.0–100.0)

## 2020-03-13 MED ORDER — IPRATROPIUM BROMIDE 0.02 % IN SOLN
0.5000 mg | RESPIRATORY_TRACT | Status: DC
  Administered 2020-03-13 – 2020-03-14 (×2): 0.5 mg via RESPIRATORY_TRACT
  Filled 2020-03-13 (×2): qty 2.5

## 2020-03-13 MED ORDER — VITAMIN D 25 MCG (1000 UNIT) PO TABS
1000.0000 [IU] | ORAL_TABLET | Freq: Every day | ORAL | Status: DC
  Administered 2020-03-13 – 2020-03-18 (×6): 1000 [IU] via ORAL
  Filled 2020-03-13 (×7): qty 1

## 2020-03-13 MED ORDER — TACROLIMUS 0.5 MG PO CAPS
0.5000 mg | ORAL_CAPSULE | Freq: Every day | ORAL | Status: DC
  Administered 2020-03-13 – 2020-03-17 (×5): 0.5 mg via ORAL
  Filled 2020-03-13 (×6): qty 1

## 2020-03-13 MED ORDER — VITAMIN B-12 100 MCG PO TABS
100.0000 ug | ORAL_TABLET | Freq: Every day | ORAL | Status: DC
  Filled 2020-03-13: qty 1

## 2020-03-13 MED ORDER — METOPROLOL TARTRATE 5 MG/5ML IV SOLN: 2.5000 mg | INTRAVENOUS | Status: DC | PRN

## 2020-03-13 MED ORDER — ADULT MULTIVITAMIN W/MINERALS CH
1.0000 | ORAL_TABLET | Freq: Every day | ORAL | Status: DC
  Administered 2020-03-15 – 2020-03-18 (×4): 1 via ORAL
  Filled 2020-03-13 (×6): qty 1

## 2020-03-13 MED ORDER — GABAPENTIN 100 MG PO CAPS
200.0000 mg | ORAL_CAPSULE | Freq: Three times a day (TID) | ORAL | Status: DC
  Administered 2020-03-13 – 2020-03-18 (×14): 200 mg via ORAL
  Filled 2020-03-13 (×14): qty 2

## 2020-03-13 MED ORDER — VERAPAMIL HCL ER 180 MG PO TBCR
180.0000 mg | EXTENDED_RELEASE_TABLET | Freq: Every day | ORAL | Status: DC
  Administered 2020-03-14: 180 mg via ORAL
  Filled 2020-03-13: qty 1

## 2020-03-13 MED ORDER — SACCHAROMYCES BOULARDII 250 MG PO CAPS
250.0000 mg | ORAL_CAPSULE | Freq: Two times a day (BID) | ORAL | Status: DC
  Administered 2020-03-13 – 2020-03-18 (×10): 250 mg via ORAL
  Filled 2020-03-13 (×12): qty 1

## 2020-03-13 MED ORDER — ADENOSINE 6 MG/2ML IV SOLN
INTRAVENOUS | Status: AC
  Filled 2020-03-13: qty 2

## 2020-03-13 MED ORDER — POLYETHYLENE GLYCOL 3350 17 G PO PACK: 17.0000 g | PACK | Freq: Every day | ORAL | Status: DC | PRN

## 2020-03-13 MED ORDER — CLONAZEPAM 0.25 MG PO TBDP: 0.2500 mg | ORAL_TABLET | Freq: Two times a day (BID) | ORAL | Status: DC | PRN

## 2020-03-13 MED ORDER — HEPARIN SODIUM (PORCINE) 5000 UNIT/ML IJ SOLN
5000.0000 [IU] | Freq: Three times a day (TID) | INTRAMUSCULAR | Status: DC
  Administered 2020-03-13 – 2020-03-15 (×5): 5000 [IU] via SUBCUTANEOUS
  Filled 2020-03-13 (×5): qty 1

## 2020-03-13 MED ORDER — VANCOMYCIN HCL IN DEXTROSE 1-5 GM/200ML-% IV SOLN
1000.0000 mg | INTRAVENOUS | Status: DC
  Filled 2020-03-13: qty 200

## 2020-03-13 MED ORDER — VITAMIN D 25 MCG (1000 UNIT) PO TABS: 1000.0000 [IU] | ORAL_TABLET | Freq: Every day | ORAL | Status: DC

## 2020-03-13 MED ORDER — ASPIRIN 81 MG PO CHEW: 81.0000 mg | CHEWABLE_TABLET | Freq: Every day | ORAL | Status: DC

## 2020-03-13 MED ORDER — SODIUM CHLORIDE 0.9 % IV SOLN
1000.0000 mL | INTRAVENOUS | Status: DC
  Administered 2020-03-13: 1000 mL via INTRAVENOUS

## 2020-03-13 MED ORDER — ASPIRIN 81 MG PO CHEW
81.0000 mg | CHEWABLE_TABLET | Freq: Every day | ORAL | Status: DC
  Administered 2020-03-13 – 2020-03-18 (×6): 81 mg via ORAL
  Filled 2020-03-13 (×7): qty 1

## 2020-03-13 MED ORDER — DORZOLAMIDE HCL 2 % OP SOLN
1.0000 [drp] | Freq: Every day | OPHTHALMIC | Status: DC
  Filled 2020-03-13: qty 10

## 2020-03-13 MED ORDER — GUAIFENESIN ER 600 MG PO TB12
600.0000 mg | ORAL_TABLET | Freq: Two times a day (BID) | ORAL | Status: DC
  Administered 2020-03-13 – 2020-03-18 (×10): 600 mg via ORAL
  Filled 2020-03-13 (×10): qty 1

## 2020-03-13 MED ORDER — ACETAMINOPHEN 325 MG PO TABS
650.0000 mg | ORAL_TABLET | Freq: Four times a day (QID) | ORAL | Status: DC | PRN
  Administered 2020-03-14: 650 mg via ORAL
  Filled 2020-03-13: qty 2

## 2020-03-13 MED ORDER — METRONIDAZOLE IN NACL 5-0.79 MG/ML-% IV SOLN
500.0000 mg | Freq: Once | INTRAVENOUS | Status: AC
  Administered 2020-03-13: 500 mg via INTRAVENOUS
  Filled 2020-03-13: qty 100

## 2020-03-13 MED ORDER — ALBUTEROL SULFATE (2.5 MG/3ML) 0.083% IN NEBU: 2.5000 mg | INHALATION_SOLUTION | RESPIRATORY_TRACT | Status: DC | PRN

## 2020-03-13 MED ORDER — MYCOPHENOLATE SODIUM 180 MG PO TBEC
180.0000 mg | DELAYED_RELEASE_TABLET | Freq: Two times a day (BID) | ORAL | Status: DC
  Administered 2020-03-13 – 2020-03-18 (×10): 180 mg via ORAL
  Filled 2020-03-13 (×10): qty 1

## 2020-03-13 MED ORDER — DOXYCYCLINE HYCLATE 100 MG PO TABS
100.0000 mg | ORAL_TABLET | Freq: Two times a day (BID) | ORAL | Status: DC
  Administered 2020-03-13 – 2020-03-18 (×10): 100 mg via ORAL
  Filled 2020-03-13 (×10): qty 1

## 2020-03-13 MED ORDER — BRIMONIDINE TARTRATE 0.15 % OP SOLN
1.0000 [drp] | Freq: Two times a day (BID) | OPHTHALMIC | Status: DC
  Administered 2020-03-13 – 2020-03-18 (×10): 1 [drp] via OPHTHALMIC
  Filled 2020-03-13: qty 5

## 2020-03-13 MED ORDER — ALLOPURINOL 300 MG PO TABS: 300.0000 mg | ORAL_TABLET | Freq: Every day | ORAL | Status: DC

## 2020-03-13 MED ORDER — SODIUM CHLORIDE 0.9 % IV SOLN
1.0000 g | Freq: Three times a day (TID) | INTRAVENOUS | Status: DC
  Filled 2020-03-13 (×2): qty 1

## 2020-03-13 MED ORDER — SODIUM CHLORIDE 0.9 % IV SOLN
2.0000 g | Freq: Two times a day (BID) | INTRAVENOUS | Status: DC
  Administered 2020-03-13 – 2020-03-14 (×2): 2 g via INTRAVENOUS
  Filled 2020-03-13 (×3): qty 2

## 2020-03-13 MED ORDER — SODIUM CHLORIDE 0.9 % IV SOLN
2.0000 g | Freq: Once | INTRAVENOUS | Status: AC
  Administered 2020-03-13: 2 g via INTRAVENOUS
  Filled 2020-03-13: qty 2

## 2020-03-13 MED ORDER — CYCLOSPORINE 0.05 % OP EMUL
1.0000 [drp] | Freq: Two times a day (BID) | OPHTHALMIC | Status: DC
  Administered 2020-03-13 – 2020-03-18 (×10): 1 [drp] via OPHTHALMIC
  Filled 2020-03-13 (×11): qty 1

## 2020-03-13 MED ORDER — ONDANSETRON HCL 4 MG/2ML IJ SOLN: 4.0000 mg | Freq: Four times a day (QID) | INTRAMUSCULAR | Status: DC | PRN

## 2020-03-13 MED ORDER — FLUOXETINE HCL 20 MG PO CAPS
40.0000 mg | ORAL_CAPSULE | Freq: Every day | ORAL | Status: DC
  Administered 2020-03-13 – 2020-03-18 (×6): 40 mg via ORAL
  Filled 2020-03-13 (×7): qty 2

## 2020-03-13 MED ORDER — TACROLIMUS 1 MG PO CAPS
1.0000 mg | ORAL_CAPSULE | Freq: Every day | ORAL | Status: DC
  Administered 2020-03-14 – 2020-03-18 (×5): 1 mg via ORAL
  Filled 2020-03-13 (×7): qty 1

## 2020-03-13 MED ORDER — ACETAMINOPHEN 650 MG RE SUPP
650.0000 mg | Freq: Once | RECTAL | Status: AC
  Administered 2020-03-13: 650 mg via RECTAL
  Filled 2020-03-13: qty 1

## 2020-03-13 MED ORDER — SODIUM CHLORIDE 0.9 % IV BOLUS (SEPSIS)
500.0000 mL | Freq: Once | INTRAVENOUS | Status: AC
  Administered 2020-03-13: 500 mL via INTRAVENOUS

## 2020-03-13 MED ORDER — VANCOMYCIN HCL 1500 MG/300ML IV SOLN
1500.0000 mg | Freq: Once | INTRAVENOUS | Status: AC
  Administered 2020-03-13: 1500 mg via INTRAVENOUS
  Filled 2020-03-13: qty 300

## 2020-03-13 MED ORDER — DOCUSATE SODIUM 100 MG PO CAPS: 100.0000 mg | ORAL_CAPSULE | Freq: Two times a day (BID) | ORAL | Status: DC | PRN

## 2020-03-13 MED ORDER — VITAMIN B-12 100 MCG PO TABS
100.0000 ug | ORAL_TABLET | Freq: Every day | ORAL | Status: DC
  Administered 2020-03-13 – 2020-03-18 (×6): 100 ug via ORAL
  Filled 2020-03-13 (×6): qty 1

## 2020-03-13 MED ORDER — FLUTICASONE FUROATE-VILANTEROL 200-25 MCG/INH IN AEPB
1.0000 | INHALATION_SPRAY | Freq: Every day | RESPIRATORY_TRACT | Status: DC
  Administered 2020-03-14 – 2020-03-18 (×5): 1 via RESPIRATORY_TRACT
  Filled 2020-03-13: qty 28

## 2020-03-13 MED ORDER — SALINE SPRAY 0.65 % NA SOLN
1.0000 | NASAL | Status: DC | PRN
  Filled 2020-03-13: qty 44

## 2020-03-13 MED ORDER — METHOCARBAMOL 500 MG PO TABS
500.0000 mg | ORAL_TABLET | Freq: Four times a day (QID) | ORAL | Status: DC
  Administered 2020-03-13 – 2020-03-15 (×5): 500 mg via ORAL
  Filled 2020-03-13 (×5): qty 1

## 2020-03-13 MED ORDER — ALLOPURINOL 300 MG PO TABS
300.0000 mg | ORAL_TABLET | Freq: Every day | ORAL | Status: DC
  Administered 2020-03-13 – 2020-03-18 (×6): 300 mg via ORAL
  Filled 2020-03-13 (×7): qty 1

## 2020-03-13 MED ORDER — SODIUM CHLORIDE 0.9 % IV SOLN: INTRAVENOUS | Status: DC

## 2020-03-13 MED ORDER — ENSURE ENLIVE PO LIQD
237.0000 mL | Freq: Two times a day (BID) | ORAL | Status: DC
  Administered 2020-03-15 – 2020-03-18 (×8): 237 mL via ORAL

## 2020-03-13 MED ORDER — FLUOXETINE HCL 20 MG PO CAPS: 40.0000 mg | ORAL_CAPSULE | Freq: Every day | ORAL | Status: DC

## 2020-03-13 MED ORDER — ACETAMINOPHEN 650 MG RE SUPP: 650.0000 mg | Freq: Four times a day (QID) | RECTAL | Status: DC | PRN

## 2020-03-13 MED ORDER — VANCOMYCIN HCL IN DEXTROSE 1-5 GM/200ML-% IV SOLN: 1000.0000 mg | Freq: Once | INTRAVENOUS | Status: DC

## 2020-03-13 MED ORDER — LATANOPROST 0.005 % OP SOLN
1.0000 [drp] | Freq: Every day | OPHTHALMIC | Status: DC
  Administered 2020-03-13 – 2020-03-17 (×5): 1 [drp] via OPHTHALMIC
  Filled 2020-03-13: qty 2.5

## 2020-03-13 MED ORDER — HYDROCORTISONE NA SUCCINATE PF 100 MG IJ SOLR
50.0000 mg | Freq: Four times a day (QID) | INTRAMUSCULAR | Status: DC
  Administered 2020-03-13 – 2020-03-14 (×3): 50 mg via INTRAVENOUS
  Filled 2020-03-13 (×3): qty 2

## 2020-03-13 MED ORDER — DORZOLAMIDE HCL 2 % OP SOLN
1.0000 [drp] | Freq: Every day | OPHTHALMIC | Status: DC
  Administered 2020-03-13 – 2020-03-18 (×6): 1 [drp] via OPHTHALMIC
  Filled 2020-03-13: qty 10

## 2020-03-13 MED ORDER — PANTOPRAZOLE SODIUM 40 MG PO TBEC
40.0000 mg | DELAYED_RELEASE_TABLET | Freq: Two times a day (BID) | ORAL | Status: DC
  Administered 2020-03-13 – 2020-03-18 (×10): 40 mg via ORAL
  Filled 2020-03-13 (×10): qty 1

## 2020-03-13 NOTE — ED Provider Notes (Signed)
Medical screening examination/treatment/procedure(s) were conducted as a shared visit with non-physician practitioner(s) and myself.  I personally evaluated the patient during the encounter.  Patient presents to the emergency department for evaluation of fever.  She is immune compromised with history of kidney transplant.  Labs and imaging reviewed and broad-spectrum antibiotics started.  Just after the time of admission I was called the patient bedside she is now in SVT with rate in the 180s to 190s.  In review of the telemetry the patient had 7 beats of V. tach followed by SVT.  Discussed the case with Dr. Tamala Julian who reviewed the EKG and is okay with the plan for adenosine for cardioversion.  Adenosine was given as outlined below with conversion to normal sinus rhythm.  No complications or prolonged pauses.   .Cardioversion  Date/Time: 03/13/2020 2:37 PM Performed by: Margette Fast, MD Authorized by: Margette Fast, MD   Consent:    Consent obtained:  Verbal   Consent given by:  Patient   Risks discussed:  Induced arrhythmia and death   Alternatives discussed:  Rate-control medication Pre-procedure details:    Cardioversion basis:  Emergent   Rhythm:  Supraventricular tachycardia   Electrode placement:  Anterior-posterior Patient sedated: No Attempt one:    Cardioversion mode attempt one: Adenosine 6 mg IV push.   Shock outcome:  Conversion to normal sinus rhythm Post-procedure details:    Patient status:  Awake   Patient tolerance of procedure:  Tolerated well, no immediate complications .Critical Care Performed by: Margette Fast, MD Authorized by: Margette Fast, MD   Critical care provider statement:    Critical care time (minutes):  45   Critical care time was exclusive of:  Separately billable procedures and treating other patients and teaching time   Critical care was necessary to treat or prevent imminent or life-threatening deterioration of the following conditions:   Circulatory failure and sepsis   Critical care was time spent personally by me on the following activities:  Discussions with consultants, evaluation of patient's response to treatment, examination of patient, ordering and performing treatments and interventions, ordering and review of laboratory studies, ordering and review of radiographic studies, pulse oximetry, re-evaluation of patient's condition, obtaining history from patient or surrogate, review of old charts, blood draw for specimens and development of treatment plan with patient or surrogate   I assumed direction of critical care for this patient from another provider in my specialty: no       Nanda Quinton, MD Emergency Medicine    Talajah Slimp, Wonda Olds, MD 03/13/20 1439

## 2020-03-13 NOTE — ED Provider Notes (Addendum)
[facility] Provider Note   CSN: [id number] Arrival date & time: 03/13/20  1022     History Chief Complaint  Patient presents with  . Code Sepsis  . Shortness of Breath    April [name] is a 64 y.o. 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  Patient states that she has had worsening shortness of breath x4 days.  She wears 2 L nasal cannula at all times however was found to be hypoxic to 88% by the facility at [facility], SNF where she is currently staying.  Oxygen was increased to 5 L with improvement to 94%.  Patient also felt her heart was racing.  She was found to be tachycardic to 130 by EMS.  She is already nonproductive cough, generalized weakness, nausea and headache.  She was given 4 mg of Zofran by EMS to arrival.  Patient denies neck pain or stiffness, visual changes, chills, wheezing, syncope, chest pain, abdominal pain, emesis, urinary frequency, dysuria, gross hematuria, diarrhea, blood in stool.  Takes a daily aspirin, no anticoagulation. Patient did not receive covid vaccinations.  Patient had recent hospital admission from 3/10-3/16 for sepsis of unknown etiology with thought that it was related to mucopurulent bronchitis.  She was also seen at outside emergency department on 03/08/2020 for COPD exacerbation, she was sent home with symptomatic care.  She was taking prednisone for bronchitis however completed on 03/05/2020.  History provided by patient with additional history obtained from chart review.     Past Medical History:  Diagnosis Date  . Arthritis   . Asthma   . CHF (congestive heart failure) ([place])   . Depression   . Essential hypertension 02/08/2020  . GERD (gastroesophageal reflux disease)   . Hyperlipidemia   .  Hyperparathyroidism   . Polycystic kidney   . PONV (postoperative nausea and vomiting)     Patient Active Problem List   Diagnosis Date Noted  . SVT (supraventricular tachycardia) ([place])   . Anemia of chronic disease   . Palliative care encounter   . Goals of care, counseling/discussion   . Essential hypertension 02/08/2020  . Glaucoma 02/08/2020  . Sepsis ([place]) 01/31/2020  . Hypoxia   . Shortness of breath   . Fracture of left superior pubic ramus ([facility])   . Pain   . Anxiety state   . Left displaced femoral neck fracture ([place]) 12/15/2019  . Status post total hip replacement, left   . Post-op pain   . Acute blood loss anemia   . Polycystic kidney   . Closed left hip fracture ([place]) 12/07/2019  . Left wrist fracture 12/07/2019  . Medication management 09/22/2019  . Physical deconditioning 09/22/2019  . Hyponatremia 06/29/2019  . Acute respiratory failure with hypoxia ([place]) 04/19/2019  . Acute on chronic diastolic CHF (congestive heart failure) ([place]) 04/19/2019  . Bilateral closed proximal tibial fracture 12/21/2018  . Closed right ankle fracture 12/21/2018  . Fracture 12/21/2018  . Lower urinary tract infectious disease 10/03/2018  . Asthma, chronic, unspecified asthma severity, with acute exacerbation 10/03/2018  . SIRS (systemic inflammatory response syndrome) ([place]) 10/03/2018  . Nausea vomiting and diarrhea 10/02/2018  . Chronic diastolic CHF (congestive heart failure) ([place]) 10/01/2017  . Influenza B 10/01/2017  . Persistent asthma with status asthmaticus   . Pneumonia 07/15/2016  . Asthma, mild intermittent  07/15/2016  . GERD (gastroesophageal reflux disease) 07/15/2016  . Renal transplant recipient 07/15/2016  . Hyperlipidemia 07/15/2016  . Depression 07/15/2016  . Bronchitis, mucopurulent recurrent ([place]) 08/08/2014  . Chronic cough 07/24/2014  . End stage renal disease ([place]) 03/23/2012  . 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, implant, and graft  03/23/2012    Past Surgical History:  Procedure Laterality Date  . 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 BILATERAL OOPHORECTOMY    . CARPAL TUNNEL RELEASE  2000  . CESAREAN SECTION    . INSERTION OF DIALYSIS CATHETER  03/31/2012   Procedure: INSERTION OF DIALYSIS CATHETER;  Surgeon: Angelia Mould, MD;  Location: [facility];  Service: Vascular;  Laterality: N/A;  insertion of dialysis catheter right internal jugular vein  . 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 ORIF TIBIA FRACTURE Left 12/23/2018   Procedure: OPEN REDUCTION INTERNAL FIXATION (ORIF) TIBIA FRACTURE;  Surgeon: Shona Needles, MD;  Location: [facility];  Service: Orthopedics;  Laterality: Left;  . ORIF TIBIA PLATEAU Right 12/23/2018   Procedure: OPEN REDUCTION INTERNAL FIXATION (ORIF) TIBIAL PLATEAU;  Surgeon: Shona Needles, MD;  Location: [facility];  Service: Orthopedics;  Laterality: Right;  . ORIF WRIST FRACTURE Left 12/08/2019   Procedure: OPEN REDUCTION INTERNAL FIXATION (ORIF) WRIST FRACTURE, REPAIR LACERATION LEFT FOREARM;  Surgeon: Dorna Leitz, MD;  Location: [facility];  Service: Orthopedics;  Laterality: Left;  . TONSILLECTOMY  1967  . TOTAL HIP ARTHROPLASTY Left 12/08/2019   Procedure: TOTAL HIP ARTHROPLASTY ANTERIOR APPROACH;  Surgeon: Dorna Leitz, MD;  Location: [facility];  Service: Orthopedics;  Laterality: Left;  . TUBAL LIGATION  2010     OB History   No obstetric history on file.     Family History  Problem Relation Age of Onset  . Hypertension Mother   . Heart disease Father        CABG history  . Polycystic kidney disease Father   . Polycystic kidney disease Brother     Social History   Tobacco Use  . Smoking status: Former Smoker    Packs/day: 0.25    Years: 15.00    Pack years: 3.75    Types: Cigarettes    Quit date: 03/22/1998    Years since quitting: 21.9  . Smokeless tobacco: Never Used  Substance Use Topics  . Alcohol use: No    Comment: social  . Drug use: No     Home Medications Prior to Admission medications   Medication Sig Start Date End Date Taking? Authorizing Provider  acetaminophen (TYLENOL) 325 MG tablet Take 650 mg by mouth every 6 (six) hours as needed for mild pain or headache.    [provider]  albuterol (VENTOLIN HFA) 108 (90 Base) MCG/ACT inhaler Inhale 2 puffs into the lungs every 4 (four) hours as needed for wheezing or shortness of breath. 01/04/20   Angiulli, Lavon Paganini, PA-C  allopurinol (ZYLOPRIM) 300 MG tablet Take 1 tablet (300 mg total) by mouth daily. 01/04/20   Angiulli, Lavon Paganini, PA-C  aspirin 81 MG chewable tablet Chew 81 mg by mouth daily.    [provider]  brimonidine (ALPHAGAN P) 0.1 % SOLN Place 1 drop 2 (two) times daily into the left eye.    [provider]  cholecalciferol (VITAMIN D3) 25 MCG (1000 UNIT) tablet Take 1 tablet (1,000 Units total) by mouth daily. 01/04/20   Angiulli, Lavon Paganini, PA-C  clonazePAM (KLONOPIN) 0.25 MG disintegrating tablet  Take 1 tablet (0.25 mg total) by mouth 2 (two) times daily as needed (Anxiety). 02/23/20   Alma Friendly, MD  cycloSPORINE (RESTASIS) 0.05 % ophthalmic emulsion Place 1 drop into both eyes 2 (two) times daily. 01/04/20   Angiulli, Lavon Paganini, PA-C  docusate sodium (COLACE) 100 MG capsule Take 1 capsule (100 mg total) by mouth 2 (two) times daily. Patient taking differently: Take 100 mg by mouth 2 (two) times daily as needed for mild constipation.  12/13/19   Shelly Coss, MD  dorzolamide (TRUSOPT) 2 % ophthalmic solution Place 1 drop into both eyes daily. 06/25/19   [provider]  feeding supplement, ENSURE ENLIVE, (ENSURE ENLIVE) LIQD Take 237 mLs by mouth 2 (two) times daily between meals. 02/23/20   Alma Friendly, MD  FLUoxetine (PROZAC) 40 MG capsule Take 1 capsule (40 mg total) by mouth daily. 01/04/20   Angiulli, Lavon Paganini, PA-C  furosemide (LASIX) 40 MG tablet Take 1 tablet (40 mg total) by mouth daily. 02/07/20   Raiford Noble  Latif, DO  gabapentin (NEURONTIN) 100 MG capsule Take 200 mg by mouth 3 (three) times daily.    [provider]  guaiFENesin (MUCINEX) 600 MG 12 hr tablet Take 1 tablet (600 mg total) by mouth 2 (two) times daily. 02/06/20   Sheikh, Georgina Quint Latif, DO  lidocaine (LIDODERM) 5 % Place 1 patch onto the skin daily. Remove & Discard patch within 12 hours or as directed by MD 02/07/20   Raiford Noble Latif, DO  magnesium oxide (MAG-OX) 400 MG tablet Take 1 tablet (400 mg total) by mouth 2 (two) times daily. 01/04/20   Angiulli, Lavon Paganini, PA-C  methocarbamol (ROBAXIN) 500 MG tablet Take 1 tablet (500 mg total) by mouth 4 (four) times daily. 01/04/20   Angiulli, Lavon Paganini, PA-C  Multiple Vitamin (MULTIVITAMIN WITH MINERALS) TABS tablet Take 1 tablet by mouth daily. 02/24/20   Alma Friendly, MD  mycophenolate (MYFORTIC) 180 MG EC tablet Take 180 mg by mouth 2 (two) times daily.    [provider]  nystatin (MYCOSTATIN) 100000 UNIT/ML suspension Use as directed 5 mLs (500,000 Units total) in the mouth or throat 4 (four) times daily. 02/23/20   Alma Friendly, MD  pantoprazole (PROTONIX) 40 MG tablet Take 1 tablet (40 mg total) by mouth 2 (two) times daily. 01/04/20   Angiulli, Lavon Paganini, PA-C  polyethylene glycol (MIRALAX / GLYCOLAX) 17 g packet Take 17 g by mouth daily as needed for mild constipation. 12/13/19   Shelly Coss, MD  predniSONE (DELTASONE) 5 MG tablet Take 2.5-5 mg by mouth See admin instructions. 5 mg in the morning, 2.5 mg in the evening    [provider]  PROGRAF 1 MG capsule Take 1 mg by mouth daily. 03/22/19   [provider]  saccharomyces boulardii (FLORASTOR) 250 MG capsule Take 1 capsule (250 mg total) by mouth 2 (two) times daily. 02/06/20   Raiford Noble Latif, DO  sodium chloride (OCEAN) 0.65 % SOLN nasal spray Place 1 spray into both nostrils as needed for congestion. 02/06/20   Sheikh, Omair Latif, DO  SYMBICORT 160-4.5 MCG/ACT inhaler Inhale 2  puffs into the lungs 2 (two) times daily. 01/04/20   Angiulli, Lavon Paganini, PA-C  tacrolimus (PROGRAF) 0.5 MG capsule Take 0.5 mg by mouth at bedtime. Takes brand name Prograf    [provider]  TRAVATAN Z 0.004 % SOLN ophthalmic solution Place 1 drop into both eyes at bedtime. 07/03/16   [provider]  verapamil (CALAN-SR) 180 MG CR tablet Take 1 tablet (180 mg total) by mouth daily. 02/23/20   Alma Friendly, MD  vitamin B-12 (CYANOCOBALAMIN) 100 MCG tablet Take 100 mcg by mouth daily.    [provider]    Allergies    Ardyth Harps [iron dextran], Pentamidine, Erythromycin [erythromycin], Iohexol, Oxycodone, Erythromycin, and Ultram [tramadol hcl]  Review of Systems   Review of Systems  All other systems are reviewed and are negative for acute change except as noted in the HPI.   Physical Exam Updated Vital Signs BP (!) 150/70   Pulse (!) 123   Temp (!) 103.1 F (39.5 C) (Rectal)   Resp (!) 29   Ht 5\' 1"  (1.549 m)   Wt 72.1 kg   SpO2 96%   BMI 30.04 kg/m   Physical Exam Vitals and nursing note reviewed.  Constitutional:      General: She is in acute distress.     Appearance: She is not ill-appearing.     Comments: Chronically ill-appearing  HENT:     Head: Normocephalic and atraumatic.     Right Ear: Tympanic membrane and external ear normal.     Left Ear: Tympanic membrane and external ear normal.     Nose: Nose normal.     Mouth/Throat:     Mouth: Mucous membranes are moist.     Pharynx: Oropharynx is clear.  Eyes:     General: No scleral icterus.       Right eye: No discharge.        Left eye: No discharge.     Extraocular Movements: Extraocular movements intact.     Conjunctiva/sclera: Conjunctivae normal.     Pupils: Pupils are equal, round, and reactive to light.  Neck:     Vascular: No JVD.     Comments: Full ROM intact without spinous process TTP. No bony stepoffs or deformities, no paraspinous muscle TTP or muscle spasms. No  rigidity or meningeal signs. No bruising, erythema, or swelling.  Cardiovascular:     Rate and Rhythm: Regular rhythm. Tachycardia present.     Pulses: Normal pulses.          Radial pulses are 2+ on the right side and 2+ on the left side.     Heart sounds: Normal heart sounds.  Pulmonary:     Comments: Patient is tachypneic.  Hypoxic on her usual 2 L to 88%.  Increased to 5 L with oxygen saturation of 95% . lungs clear to auscultation in all fields. Symmetric chest rise.  No wheezing, rales or rhonchi heard.  She is speaking in short sentences. Chest:     Chest wall: No tenderness.  Abdominal:     General: Bowel sounds are normal.     Comments: Abdomen is soft, non-distended, and non-tender in all quadrants. No rigidity, no guarding. No peritoneal signs.  Musculoskeletal:        General: Normal range of motion.     Cervical back: Normal range of motion.     Right lower leg: No edema.     Left lower leg: No edema.  Lymphadenopathy:     Cervical: No cervical adenopathy.  Skin:    General: Skin is warm and dry.     Capillary Refill: Capillary refill takes less than 2 seconds.  Neurological:     Mental Status: She is oriented to person, place, and time.     GCS: GCS eye subscore is 4. GCS verbal subscore is 5. GCS  motor subscore is 6.     Comments: Speech is clear and goal oriented, follows commands CN III-XII intact, no facial droop Normal strength in upper and lower extremities bilaterally including dorsiflexion and plantar flexion, strong and equal grip strength Sensation normal to light and sharp touch Moves extremities without ataxia, coordination intact Normal finger to nose and rapid alternating movements    Psychiatric:        Behavior: Behavior normal.     ED Results / Procedures / Treatments   Labs (all labs ordered are listed, but only abnormal results are displayed) Labs Reviewed  COMPREHENSIVE METABOLIC PANEL - Abnormal; Notable for the following components:       Result Value   Glucose, Bld 140 (*)    BUN 44 (*)    Albumin 2.7 (*)    Total Bilirubin 1.3 (*)    All other components within normal limits  CBC WITH DIFFERENTIAL/PLATELET - Abnormal; Notable for the following components:   WBC 20.4 (*)    RBC 3.37 (*)    Hemoglobin 9.5 (*)    HCT 33.4 (*)    MCHC 28.4 (*)    RDW 22.0 (*)    Neutro Abs 17.3 (*)    Basophils Absolute 0.2 (*)    All other components within normal limits  URINALYSIS, ROUTINE W REFLEX MICROSCOPIC - Abnormal; Notable for the following components:   Hgb urine dipstick SMALL (*)    Protein, ur 100 (*)    Leukocytes,Ua SMALL (*)    Bacteria, UA MANY (*)    All other components within normal limits  BRAIN NATRIURETIC PEPTIDE - Abnormal; Notable for the following components:   B Natriuretic Peptide 552.6 (*)    All other components within normal limits  RESPIRATORY PANEL BY RT PCR (FLU A&B, COVID)  CULTURE, BLOOD (ROUTINE X 2)  CULTURE, BLOOD (ROUTINE X 2)  URINE CULTURE  LACTIC ACID, PLASMA  APTT  PROTIME-INR    EKG EKG Interpretation  Date/Time:  Wednesday March 13 2020 10:33:08 EDT Ventricular Rate:  124 PR Interval:    QRS Duration: 85 QT Interval:  316 QTC Calculation: 454 R Axis:   88 Text Interpretation: Sinus tachycardia Borderline right axis deviation No STEMI Confirmed by Nanda Quinton [phone]) on 03/13/2020 10:39:27 AM   Radiology DG Chest Port 1 View  Result Date: 03/13/2020 CLINICAL DATA:  Shortness of breath, cough and hypoxia today. Weakness. EXAM: PORTABLE CHEST 1 VIEW COMPARISON:  03/08/2020 FINDINGS: Cardiac silhouette is top-normal in size. No mediastinal or hilar masses. Lung volumes are relatively low. Mild linear opacities noted at the bases consistent with atelectasis. Lungs otherwise clear. No convincing pleural effusion.  No pneumothorax. Skeletal structures are demineralized. There are rib fractures with associated callus formation on the right, lateral seventh and eighth ribs, which  were present on the prior study. IMPRESSION: No acute cardiopulmonary disease. Electronically Signed   By: Lajean Manes M.D.   On: 03/13/2020 11:08    Procedures .Critical Care Performed by: Cherre Robins, PA-C Authorized by: Cherre Robins, PA-C   Critical care provider statement:    Critical care time (minutes):  44   Critical care time was exclusive of:  Separately billable procedures and treating other patients and teaching time   Critical care was necessary to treat or prevent imminent or life-threatening deterioration of the following conditions:  Sepsis and respiratory failure   Critical care was time spent personally by me on the following activities:  Discussions with consultants, evaluation of  patient's response to treatment, examination of patient, ordering and performing treatments and interventions, ordering and review of laboratory studies, ordering and review of radiographic studies, pulse oximetry, re-evaluation of patient's condition, obtaining history from patient or surrogate and review of old charts   (including critical care time)  Medications Ordered in ED Medications  0.9 %  sodium chloride infusion (1,000 mLs Intravenous New Bag/Given 03/13/20 1105)  ceFEPIme (MAXIPIME) 1 g in sodium chloride 0.9 % 100 mL IVPB (has no administration in time range)  vancomycin (VANCOCIN) IVPB 1000 mg/200 mL premix (has no administration in time range)  ceFEPIme (MAXIPIME) 2 g in sodium chloride 0.9 % 100 mL IVPB (0 g Intravenous Stopped 03/13/20 1137)  metroNIDAZOLE (FLAGYL) IVPB 500 mg (0 mg Intravenous Stopped 03/13/20 1312)  sodium chloride 0.9 % bolus 500 mL (0 mLs Intravenous Stopped 03/13/20 1312)  acetaminophen (TYLENOL) suppository 650 mg (650 mg Rectal Given 03/13/20 1057)  vancomycin (VANCOREADY) IVPB 1500 mg/300 mL (0 mg Intravenous Stopped 03/13/20 1340)    ED Course  I have reviewed the triage vital signs and the nursing notes.  Pertinent labs & imaging results  that were available during my care of the patient were reviewed by me and considered in my medical decision making (see chart for details).  Clinical Course as of Mar 13 1345  Wed Mar 13, 2020  1042 Code sepsis initiated on arrival with antibiotics ordered for unknown source.  500 mL bolus given while waiting for lactic acid to result.   [KA]  1156 lactic acid is within normal range.  Hold off on 30 cc/kg fluid bolus  Lactic acid, plasma [KA]    Clinical Course User Index [KA] Breslin Burklow, Harley Hallmark, PA-C   Vitals:   03/13/20 1313 03/13/20 1315  BP:  135/71  Pulse:  (!) 111  Resp:  (!) 25  Temp: 98.4 F (36.9 C)   SpO2:  96%     MDM Rules/Calculators/A&P                      Patient presents as code sepsis and emergency department sepsis protocol initiated upon arrival.  Patient is febrile at 103.1 rectally, tachypneic, hypoxic on her usual liters nasal cannula to 88% with improvement to 95% on 5 L, normotensive. Tylenol suppository given. Her lungs are clear to auscultation in all fields.  She has no abdominal tenderness. Normal neuro exam/ No meningeal signs.  Antibiotic started for unknown source.  Labs here show leukocytosis of 20.4.  Hemoglobin is 9.5 which appears around her baseline.  No severe electrolyte abnormalities. Hypoalbuminemia at 2.7, likely related to poor nutritional status.  Lactic acid is within normal range.  INR and PTT are also normal.  Covid PCR panel is negative.  UA shows possible infection with small leukocytes, 11-20 WBC, many bacteria.  Culture sent.  She has no urinary symptoms, do not think this is the driving source of her sepsis.  EKG shows sinus tachycardia.  Chest x-ray viewed by me shows no active infection but there is mild linear opacities consistent with atelectasis.  Was given a 500 mL bolus of IV fluids on arrival awaiting for lactic acid to result.  It is within normal range so we will continue to hold off on 30 cc/kg's fluid bolus in attempt to not  fluid overload the patient as she has history of heart failure. She did also receive approximately 250 ml fluids by EMS en route. BNP still pending. Urine culture and blood cultures  reviewed from last septic admission without any findings or growth.  This case was discussed with Dr. Laverta Baltimore who has seen the patient and agrees with plan to admit. Spoke with Dr. Roosevelt Locks with hospitalist service who agrees to assume care of patient and bring into the hospital for further evaluation and management.     Portions of this note were generated with [employer]. Dictation errors may occur despite best attempts at proofreading.   Final Clinical Impression(s) / ED Diagnoses Final diagnoses:  Sepsis without acute organ dysfunction, due to unspecified organism [facility])    Rx / DC Orders ED Discharge Orders    None       Flint Melter 03/13/20 1347    Cherre Robins, PA-C 03/13/20 1348    Long, Wonda Olds, MD 03/13/20 1440    Margette Fast, MD 03/13/20 2006

## 2020-03-13 NOTE — Significant Event (Addendum)
Rapid Response Event Note  Overview: Respiratory - Tachypnea   Initial Focused Assessment: Received a call from nursing staff with concerns of patient having increased RR and mottling of BLE. Upon arrival, patient was drowsy but quickly woke, able to talk to full sentences mostly, did appear to have some labored breathing with increased RR and mild use of accessory muscles. Per patient, she does feel like she is working a little hard to breath but overall feels better. HR 110-115 - ST, 130/63(79), RR 25- 35, 97% on 2L [place]. There is trace mottling down both legs, +2 edema on both feet, feet are bluish in color - pulses are present (doppler confirmed) but they are cool to touch as well. Her trunk region is warm to touch, oral temperature was 98.1. Lung sounds - diminished throughout, few crackles in the bases.   Staff paged the MD on call and I spoke with the MD and updated him. Obtained orders for ABG  Interventions: -- STAT ABG -- Use HHFNC and/or BIPAP  for increased WOB   Plan of Care: -- F/U with ABG results -- Currently the patient does not appear to be in significant distress. I did not think she needs HHFNC or BIPAP right now but should her WOB increase or should patient become short of breath or fatigued then these modalities can be used.  -- Keep NPO for now.  -- Aspiration Precautions -- Keep HOB >30 -- Monitor VS and respiratory status  Event Summary:  Call Time [place]   Madoc Holquin R

## 2020-03-13 NOTE — H&P (Signed)
History and Physical    [name] DVV:[id number] DOB: [birth year] DOA: 03/13/2020  PCP: [name], MD   Patient coming from: SNF  I have personally briefly reviewed patient's old medical records in [place]  Chief Complaint: Short of breath  HPI: April [name] is a 64 y.o. 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    For last 4 days,  She is already nonproductive cough, generalized weakness, nausea and headache. At baseline, she wears 2 L at all times however was found to be hypoxic to 88% this morning by nursing home staff.  Oxygen was increased to 5 L with improvement to 94%.  Patient also felt her heart was racing.  She was found to be tachycardic to 130 by EMS. .  Patient denies  chills, wheezing, syncope, chest pain, abdominal pain, emesis, urinary frequency, dysuria, diarrhea.  Takes a daily aspirin, no anticoagulation. Patient did not receive covid vaccinations.  Patient had recent hospital admission from 3/10-3/16 for sepsis of unknown etiology with thought that it was related to mucopurulent bronchitis.  She was also seen at outside emergency department on 03/08/2020 for COPD exacerbation, she was sent home with symptomatic care.  She was taking prednisone for bronchitis however completed on 03/05/2020.  ED Course: Heart rate was found to be in the 180s, EKG showed SVT, adenosine given, rate 2 sinus.  X-ray shows no significant new infiltrates  Review of Systems: As per HPI otherwise 10 point review of systems negative.    Past Medical History:  Diagnosis Date  . Arthritis   . Asthma   . CHF (congestive heart failure) ([place])   . Depression   . Essential hypertension 02/08/2020  .  GERD (gastroesophageal reflux disease)   . Hyperlipidemia   . Hyperparathyroidism   . Polycystic kidney   . PONV (postoperative nausea and vomiting)     Past Surgical History:  Procedure Laterality Date  . 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 BILATERAL OOPHORECTOMY    . CARPAL TUNNEL RELEASE  2000  . CESAREAN SECTION    . INSERTION OF DIALYSIS CATHETER  03/31/2012   Procedure: INSERTION OF DIALYSIS CATHETER;  Surgeon: Angelia Mould, MD;  Location: [facility];  Service: Vascular;  Laterality: N/A;  insertion of dialysis catheter right internal jugular vein  . 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 ORIF TIBIA FRACTURE Left 12/23/2018   Procedure: OPEN REDUCTION INTERNAL FIXATION (ORIF) TIBIA FRACTURE;  Surgeon: Shona Needles, MD;  Location: [facility];  Service: Orthopedics;  Laterality: Left;  . ORIF TIBIA PLATEAU Right 12/23/2018   Procedure: OPEN REDUCTION INTERNAL FIXATION (ORIF) TIBIAL PLATEAU;  Surgeon: Shona Needles, MD;  Location: [facility];  Service: Orthopedics;  Laterality: Right;  . ORIF WRIST FRACTURE Left 12/08/2019   Procedure: OPEN REDUCTION INTERNAL FIXATION (ORIF) WRIST FRACTURE, REPAIR LACERATION LEFT FOREARM;  Surgeon: Dorna Leitz, MD;  Location: [facility];  Service: Orthopedics;  Laterality: Left;  . TONSILLECTOMY  1967  . TOTAL HIP ARTHROPLASTY Left 12/08/2019   Procedure: TOTAL HIP ARTHROPLASTY ANTERIOR APPROACH;  Surgeon: Dorna Leitz, MD;  Location: [facility];  Service: Orthopedics;  Laterality: Left;  . TUBAL LIGATION  2010     reports that she quit smoking  about 21 years ago. Her smoking use included cigarettes. She has a 3.75 pack-year smoking history. She has never used smokeless tobacco. She reports that she does not drink alcohol or use drugs.  Allergies  Allergen Reactions  . Infed [Iron Dextran] Other (See Comments)    Chest tightness  . Pentamidine Itching, Shortness Of Breath and Swelling  . Erythromycin [Erythromycin] Other (See  Comments)    Mouth Ulcers  . Iohexol Other (See Comments)    Per patient "she has had a kidney transplant and should never have contrast"  . Oxycodone Nausea Only and Nausea And Vomiting  . Erythromycin Rash    Causes breakout in mouth  . Ultram [Tramadol Hcl] Anxiety    Family History  Problem Relation Age of Onset  . Hypertension Mother   . Heart disease Father        CABG history  . Polycystic kidney disease Father   . Polycystic kidney disease Brother      Prior to Admission medications   Medication Sig Start Date End Date Taking? Authorizing Provider  acetaminophen (TYLENOL) 325 MG tablet Take 650 mg by mouth every 6 (six) hours as needed for mild pain or headache.    [provider]  albuterol (VENTOLIN HFA) 108 (90 Base) MCG/ACT inhaler Inhale 2 puffs into the lungs every 4 (four) hours as needed for wheezing or shortness of breath. 01/04/20   Angiulli, Lavon Paganini, PA-C  allopurinol (ZYLOPRIM) 300 MG tablet Take 1 tablet (300 mg total) by mouth daily. 01/04/20   Angiulli, Lavon Paganini, PA-C  aspirin 81 MG chewable tablet Chew 81 mg by mouth daily.    [provider]  brimonidine (ALPHAGAN P) 0.1 % SOLN Place 1 drop 2 (two) times daily into the left eye.    [provider]  cholecalciferol (VITAMIN D3) 25 MCG (1000 UNIT) tablet Take 1 tablet (1,000 Units total) by mouth daily. 01/04/20   Angiulli, Lavon Paganini, PA-C  clonazePAM (KLONOPIN) 0.25 MG disintegrating tablet Take 1 tablet (0.25 mg total) by mouth 2 (two) times daily as needed (Anxiety). 02/23/20   Alma Friendly, MD  cycloSPORINE (RESTASIS) 0.05 % ophthalmic emulsion Place 1 drop into both eyes 2 (two) times daily. 01/04/20   Angiulli, Lavon Paganini, PA-C  docusate sodium (COLACE) 100 MG capsule Take 1 capsule (100 mg total) by mouth 2 (two) times daily. Patient taking differently: Take 100 mg by mouth 2 (two) times daily as needed for mild constipation.  12/13/19   Shelly Coss, MD  dorzolamide (TRUSOPT)  2 % ophthalmic solution Place 1 drop into both eyes daily. 06/25/19   [provider]  feeding supplement, ENSURE ENLIVE, (ENSURE ENLIVE) LIQD Take 237 mLs by mouth 2 (two) times daily between meals. 02/23/20   Alma Friendly, MD  FLUoxetine (PROZAC) 40 MG capsule Take 1 capsule (40 mg total) by mouth daily. 01/04/20   Angiulli, Lavon Paganini, PA-C  furosemide (LASIX) 40 MG tablet Take 1 tablet (40 mg total) by mouth daily. 02/07/20   Raiford Noble Latif, DO  gabapentin (NEURONTIN) 100 MG capsule Take 200 mg by mouth 3 (three) times daily.    [provider]  guaiFENesin (MUCINEX) 600 MG 12 hr tablet Take 1 tablet (600 mg total) by mouth 2 (two) times daily. 02/06/20   Sheikh, Georgina Quint Latif, DO  lidocaine (LIDODERM) 5 % Place 1 patch onto the skin daily. Remove & Discard patch within 12 hours or as directed by MD 02/07/20   Kerney Elbe,  DO  magnesium oxide (MAG-OX) 400 MG tablet Take 1 tablet (400 mg total) by mouth 2 (two) times daily. 01/04/20   Angiulli, Lavon Paganini, PA-C  methocarbamol (ROBAXIN) 500 MG tablet Take 1 tablet (500 mg total) by mouth 4 (four) times daily. 01/04/20   Angiulli, Lavon Paganini, PA-C  Multiple Vitamin (MULTIVITAMIN WITH MINERALS) TABS tablet Take 1 tablet by mouth daily. 02/24/20   Alma Friendly, MD  mycophenolate (MYFORTIC) 180 MG EC tablet Take 180 mg by mouth 2 (two) times daily.    [provider]  nystatin (MYCOSTATIN) 100000 UNIT/ML suspension Use as directed 5 mLs (500,000 Units total) in the mouth or throat 4 (four) times daily. 02/23/20   Alma Friendly, MD  pantoprazole (PROTONIX) 40 MG tablet Take 1 tablet (40 mg total) by mouth 2 (two) times daily. 01/04/20   Angiulli, Lavon Paganini, PA-C  polyethylene glycol (MIRALAX / GLYCOLAX) 17 g packet Take 17 g by mouth daily as needed for mild constipation. 12/13/19   Shelly Coss, MD  predniSONE (DELTASONE) 5 MG tablet Take 2.5-5 mg by mouth See admin instructions. 5 mg in the morning, 2.5 mg in the  evening    [provider]  PROGRAF 1 MG capsule Take 1 mg by mouth daily. 03/22/19   [provider]  saccharomyces boulardii (FLORASTOR) 250 MG capsule Take 1 capsule (250 mg total) by mouth 2 (two) times daily. 02/06/20   Raiford Noble Latif, DO  sodium chloride (OCEAN) 0.65 % SOLN nasal spray Place 1 spray into both nostrils as needed for congestion. 02/06/20   Sheikh, Omair Latif, DO  SYMBICORT 160-4.5 MCG/ACT inhaler Inhale 2 puffs into the lungs 2 (two) times daily. 01/04/20   Angiulli, Lavon Paganini, PA-C  tacrolimus (PROGRAF) 0.5 MG capsule Take 0.5 mg by mouth at bedtime. Takes brand name Prograf    [provider]  TRAVATAN Z 0.004 % SOLN ophthalmic solution Place 1 drop into both eyes at bedtime. 07/03/16   [provider]  verapamil (CALAN-SR) 180 MG CR tablet Take 1 tablet (180 mg total) by mouth daily. 02/23/20   Alma Friendly, MD  vitamin B-12 (CYANOCOBALAMIN) 100 MCG tablet Take 100 mcg by mouth daily.    [provider]    Physical Exam: Vitals:   03/13/20 1434 03/13/20 1435 03/13/20 1445 03/13/20 1500  BP:   115/72 131/69  Pulse: (!) 191 (!) 103 (!) 117 (!) 116  Resp: (!) 23 (!) 25 (!) 25 (!) 24  Temp:      TempSrc:      SpO2: 97% 96% 96% 98%  Weight:      Height:        Constitutional: NAD, calm, comfortable Vitals:   03/13/20 1434 03/13/20 1435 03/13/20 1445 03/13/20 1500  BP:   115/72 131/69  Pulse: (!) 191 (!) 103 (!) 117 (!) 116  Resp: (!) 23 (!) 25 (!) 25 (!) 24  Temp:      TempSrc:      SpO2: 97% 96% 96% 98%  Weight:      Height:       Eyes: PERRL, lids and conjunctivae normal ENMT: Mucous membranes are dry. Posterior pharynx clear of any exudate or lesions.Normal dentition.  Neck: normal, supple, no masses, no thyromegaly Respiratory: clear to auscultation bilaterally, no wheezing, no crackles.  Increased respiratory effort.  Positive signs of using accessory muscle to help breath Cardiovascular: Regular rate  and rhythm, no murmurs / rubs / gallops. No extremity edema. 2+  pedal pulses. No carotid bruits.  Abdomen: no tenderness, no masses palpated. No hepatosplenomegaly. Bowel sounds positive.  Musculoskeletal: no clubbing / cyanosis. No joint deformity upper and lower extremities. Good ROM, no contractures. Normal muscle tone.  Skin: no rashes, lesions, ulcers. No induration Neurologic: CN 2-12 grossly intact. Sensation intact, DTR normal. Strength 5/5 in all 4.  Psychiatric: Normal judgment and insight. Alert and oriented x 3. Normal mood.    Labs on Admission: I have personally reviewed following labs and imaging studies  CBC: Recent Labs  Lab 03/13/20 1044  WBC 20.4*  NEUTROABS 17.3*  HGB 9.5*  HCT 33.4*  MCV 99.1  PLT 814   Basic Metabolic Panel: Recent Labs  Lab 03/13/20 1044  NA 137  K 4.0  CL 99  CO2 28  GLUCOSE 140*  BUN 44*  CREATININE 0.99  CALCIUM 9.6   GFR: Estimated Creatinine Clearance: 53.5 mL/min (by C-G formula based on SCr of 0.99 mg/dL). Liver Function Tests: Recent Labs  Lab 03/13/20 1044  AST 28  ALT 15  ALKPHOS 99  BILITOT 1.3*  PROT 7.8  ALBUMIN 2.7*   No results for input(s): LIPASE, AMYLASE in the last 168 hours. No results for input(s): AMMONIA in the last 168 hours. Coagulation Profile: Recent Labs  Lab 03/13/20 1044  INR 1.2   Cardiac Enzymes: No results for input(s): CKTOTAL, CKMB, CKMBINDEX, TROPONINI in the last 168 hours. BNP (last 3 results) No results for input(s): PROBNP in the last 8760 hours. HbA1C: No results for input(s): HGBA1C in the last 72 hours. CBG: No results for input(s): GLUCAP in the last 168 hours. Lipid Profile: No results for input(s): CHOL, HDL, LDLCALC, TRIG, CHOLHDL, LDLDIRECT in the last 72 hours. Thyroid Function Tests: No results for input(s): TSH, T4TOTAL, FREET4, T3FREE, THYROIDAB in the last 72 hours. Anemia Panel: No results for input(s): VITAMINB12, FOLATE, FERRITIN, TIBC, IRON, RETICCTPCT  in the last 72 hours. Urine analysis:    Component Value Date/Time   COLORURINE YELLOW 03/13/2020 1138   APPEARANCEUR CLEAR 03/13/2020 1138   LABSPEC 1.012 03/13/2020 1138   PHURINE 5.0 03/13/2020 1138   GLUCOSEU NEGATIVE 03/13/2020 1138   HGBUR SMALL (A) 03/13/2020 1138   BILIRUBINUR NEGATIVE 03/13/2020 1138   KETONESUR NEGATIVE 03/13/2020 1138   PROTEINUR 100 (A) 03/13/2020 1138   UROBILINOGEN 0.2 05/15/2013 0733   NITRITE NEGATIVE 03/13/2020 1138   LEUKOCYTESUR SMALL (A) 03/13/2020 1138    Radiological Exams on Admission: DG Chest Port 1 View  Result Date: 03/13/2020 CLINICAL DATA:  Shortness of breath, cough and hypoxia today. Weakness. EXAM: PORTABLE CHEST 1 VIEW COMPARISON:  03/08/2020 FINDINGS: Cardiac silhouette is top-normal in size. No mediastinal or hilar masses. Lung volumes are relatively low. Mild linear opacities noted at the bases consistent with atelectasis. Lungs otherwise clear. No convincing pleural effusion.  No pneumothorax. Skeletal structures are demineralized. There are rib fractures with associated callus formation on the right, lateral seventh and eighth ribs, which were present on the prior study. IMPRESSION: No acute cardiopulmonary disease. Electronically Signed   By: Lajean Manes M.D.   On: 03/13/2020 11:08    EKG: Independently reviewed.SVT, prolonged QTC  Assessment/Plan Active Problems:   PNA (pneumonia)  Sepsis POA -She has no cough, fever, suspect healthcare acquired pneumonia, agreed to extend coverage vancomycin and cefepime for now, but also send atypical study, since there is no significant infiltrates on x-ray, and her cough has been dry, cover atypical with doxycycline given EKG showing prolonged QTC -Culture blood, culture  sputum -N.p.o. for now, consult speech evaluation (although somewhat low on suspicion for aspiration pneumonia, given patient denied any cough or choking after eating) -Clinically she is dry, with tachycardia, and  physical exam showed dry membranes, will hold Lasix, start slow hydration for today.  Acute on chronic hypoxic respite failure Stabilized on 5 L, compared to 2 L baseline, continue to monitor her breathing status, may need BiPAP Admit to progressive care unit for close monitoring  Nonsustained SVT Related to sepsis, status post cardioversion with adenosine (should be used with caution since patient has severe COPD) Cardiology on board, add as needed metoprolol for heart rate more than 130. Continue verapamil Check magnesium and phosphate  Chronic steroid-dependent COPD with mild exacerbation We will switch her steroid to stressing dose of hydrocortisone until her condition stabilized Clinically however no significant wheezing or other signs of severe COPD exacerbation  PCKD s/p renal transplantation continue immunosuppressants including mycophenolate, tacrolimus and prednisone  Chronic anemia secondary to chronic illness H&H stable  Deconditioning PT evaluation when she is better  DVT prophylaxis: Heparin subcu Code Status: Full code Family Communication: None at bedside Disposition Plan: See you for close monitoring Consults called: Cardiology for SVT Admission status: PCU   Lequita Halt MD [facility] Pager [phone]    03/13/2020, 3:12 PM

## 2020-03-13 NOTE — Progress Notes (Signed)
Patient appeared to be having respiratory distress when she arrived to 2W37. Breathing was labored with increased RR as well as accessory muscle use. Upon assessment, BLE were noted to be bluish in color and cool to touch with pulses present. Rapid Response RN was called to bedside to assess the patient. Dr. Roosevelt Locks was also called and notified of patient's condition.

## 2020-03-13 NOTE — Progress Notes (Signed)
Pharmacy Antibiotic Note  April [name] is a 64 y.o. female admitted on 03/13/2020 with shortness of breath now concerning for infection/sepsis.  Pharmacy has been consulted for Cefepime and vancomycin dosing.  WBC up to 20. SCr wnl. LA 1.4   Plan: -Cefepime 1 gm IV Q 8 hours -Vancomycin 1500 mg IV once, then start Vancomycin 1000 mg IV Q 24 hrs. Goal AUC 400-550. Expected AUC: 490 SCr used: 0.7 -Monitor CBC, renal fx, cultures and clinical progress -Vanc levels as indicated    Height: 5\' 1"  (154.9 cm) Weight: 72.1 kg (159 lb) IBW/kg (Calculated) : 47.8  Temp (24hrs), Avg:103.1 F (39.5 C), Min:103.1 F (39.5 C), Max:103.1 F (39.5 C)  No results for input(s): WBC, CREATININE, LATICACIDVEN, VANCOTROUGH, VANCOPEAK, VANCORANDOM, GENTTROUGH, GENTPEAK, GENTRANDOM, TOBRATROUGH, TOBRAPEAK, TOBRARND, AMIKACINPEAK, AMIKACINTROU, AMIKACIN in the last 168 hours.  Estimated Creatinine Clearance: 57.6 mL/min (by C-G formula based on SCr of 0.92 mg/dL).    Allergies  Allergen Reactions  . Infed [Iron Dextran] Other (See Comments)    Chest tightness  . Pentamidine Itching, Shortness Of Breath and Swelling  . Erythromycin [Erythromycin] Other (See Comments)    Mouth Ulcers  . Iohexol Other (See Comments)    Per patient "she has had a kidney transplant and should never have contrast"  . Oxycodone Nausea Only and Nausea And Vomiting  . Erythromycin Rash    Causes breakout in mouth  . Ultram [Tramadol Hcl] Anxiety    Antimicrobials this admission: Cefepime 4/21 >>  Vanc 4/21 >>   Dose adjustments this admission:   Microbiology results: 4/21 BCx:  4/21 UCx:     Thank you for allowing pharmacy to be a part of this patient's care.  Albertina Parr, PharmD., BCPS, BCCCP Clinical Pharmacist Clinical phone for 03/13/20 until 3:30pm: [phone] If after 3:30pm, please refer to [facility] for unit-specific pharmacist

## 2020-03-13 NOTE — ED Triage Notes (Signed)
Pt here from [name] for [facility] x 4 days, just stopped taking prednisone on 4/13 for bronchitis, supposed to restart soon. Wears 2L O[place] at all times, increased by facility to 3L 88% on 3L with improvement to 94% on 5L. Tachy to 130. Pt c/o generalized weakness, nausea, and headache. 4 mg zofran given without relief.

## 2020-03-13 NOTE — Progress Notes (Signed)
D/W pt regarding aspiration risk, pt claimed that she was evaluated by formal speech evaluation therapist last month and she passed it. Will hold off speech evaluation for now and start diet.

## 2020-03-14 ENCOUNTER — Inpatient Hospital Stay (HOSPITAL_COMMUNITY): Payer: Medicare Other

## 2020-03-14 DIAGNOSIS — F329 Major depressive disorder, single episode, unspecified: Secondary | ICD-10-CM

## 2020-03-14 DIAGNOSIS — J9621 Acute and chronic respiratory failure with hypoxia: Secondary | ICD-10-CM

## 2020-03-14 DIAGNOSIS — I5032 Chronic diastolic (congestive) heart failure: Secondary | ICD-10-CM

## 2020-03-14 DIAGNOSIS — J411 Mucopurulent chronic bronchitis: Secondary | ICD-10-CM

## 2020-03-14 DIAGNOSIS — I471 Supraventricular tachycardia: Secondary | ICD-10-CM

## 2020-03-14 DIAGNOSIS — D638 Anemia in other chronic diseases classified elsewhere: Secondary | ICD-10-CM

## 2020-03-14 DIAGNOSIS — J4520 Mild intermittent asthma, uncomplicated: Secondary | ICD-10-CM

## 2020-03-14 LAB — CBC
HCT: 29.2 % — ABNORMAL LOW (ref 36.0–46.0)
Hemoglobin: 8.4 g/dL — ABNORMAL LOW (ref 12.0–15.0)
MCH: 28.4 pg (ref 26.0–34.0)
MCHC: 28.8 g/dL — ABNORMAL LOW (ref 30.0–36.0)
MCV: 98.6 fL (ref 80.0–100.0)
Platelets: 211 10*3/uL (ref 150–400)
RBC: 2.96 MIL/uL — ABNORMAL LOW (ref 3.87–5.11)
RDW: 21.9 % — ABNORMAL HIGH (ref 11.5–15.5)
WBC: 12.7 10*3/uL — ABNORMAL HIGH (ref 4.0–10.5)
nRBC: 0 % (ref 0.0–0.2)

## 2020-03-14 LAB — BASIC METABOLIC PANEL
Anion gap: 11 (ref 5–15)
BUN: 35 mg/dL — ABNORMAL HIGH (ref 8–23)
CO2: 24 mmol/L (ref 22–32)
Calcium: 9.6 mg/dL (ref 8.9–10.3)
Chloride: 105 mmol/L (ref 98–111)
Creatinine, Ser: 0.84 mg/dL (ref 0.44–1.00)
GFR calc Af Amer: >60 mL/min (ref >60)
GFR calc non Af Amer: >60 mL/min (ref >60)
Glucose, Bld: 177 mg/dL — ABNORMAL HIGH (ref 70–99)
Potassium: 4.4 mmol/L (ref 3.5–5.1)
Sodium: 140 mmol/L (ref 135–145)

## 2020-03-14 MED ORDER — VERAPAMIL HCL ER 240 MG PO TBCR
240.0000 mg | EXTENDED_RELEASE_TABLET | Freq: Every day | ORAL | Status: DC
  Administered 2020-03-15 – 2020-03-18 (×4): 240 mg via ORAL
  Filled 2020-03-14 (×5): qty 1

## 2020-03-14 MED ORDER — METHYLPREDNISOLONE SODIUM SUCC 40 MG IJ SOLR
40.0000 mg | Freq: Every day | INTRAMUSCULAR | Status: DC
  Administered 2020-03-15 – 2020-03-16 (×2): 40 mg via INTRAVENOUS
  Filled 2020-03-14 (×3): qty 1

## 2020-03-14 MED ORDER — IPRATROPIUM-ALBUTEROL 0.5-2.5 (3) MG/3ML IN SOLN
3.0000 mL | Freq: Four times a day (QID) | RESPIRATORY_TRACT | Status: DC
  Administered 2020-03-14 – 2020-03-15 (×3): 3 mL via RESPIRATORY_TRACT
  Filled 2020-03-14 (×4): qty 3

## 2020-03-14 MED ORDER — VERAPAMIL HCL ER 120 MG PO TBCR
60.0000 mg | EXTENDED_RELEASE_TABLET | Freq: Once | ORAL | Status: DC
  Filled 2020-03-14: qty 0.5

## 2020-03-14 MED ORDER — IPRATROPIUM BROMIDE 0.02 % IN SOLN
0.5000 mg | Freq: Three times a day (TID) | RESPIRATORY_TRACT | Status: DC
  Administered 2020-03-14: 0.5 mg via RESPIRATORY_TRACT
  Filled 2020-03-14: qty 2.5

## 2020-03-14 NOTE — Plan of Care (Signed)
  Problem: Coping: Goal: Ability to cope will improve 03/14/2020 0522 by Loma Messing, RN Outcome: Progressing 03/14/2020 0510 by Karsten Fells D, RN Outcome: Progressing Goal: Level of anxiety will decrease 03/14/2020 0522 by Loma Messing, RN Outcome: Progressing 03/14/2020 0510 by Loma Messing, RN Outcome: Progressing   Problem: Respiratory: Goal: Diagnostic test results will improve 03/14/2020 0522 by Loma Messing, RN Outcome: Progressing 03/14/2020 0510 by Loma Messing, RN Outcome: Progressing Goal: Identification of resources available to assist in meeting health care needs will improve 03/14/2020 0522 by Loma Messing, RN Outcome: Progressing 03/14/2020 0510 by Loma Messing, RN Outcome: Progressing Goal: Ability to maintain adequate oxygenation and ventilation will improve 03/14/2020 0522 by Loma Messing, RN Outcome: Progressing 03/14/2020 0510 by Loma Messing, RN Outcome: Progressing Goal: Ability to maintain a clear airway will improve 03/14/2020 0522 by Loma Messing, RN Outcome: Progressing 03/14/2020 0510 by Loma Messing, RN Outcome: Progressing

## 2020-03-14 NOTE — Progress Notes (Signed)
PROGRESS NOTE    April [name]  EVO:[id number] DOB: 1956/02/12 DOA: 03/13/2020 PCP: [name], MD    Brief Narrative:  Patient was admitted to the hospital with a working diagnosis of acute on chronic hypoxic respiratory failure due to COPD exacerbation, complicated by SVT, to rule out community acquired pneumonia.   64 year old female who presented with dyspnea.  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  She presented with 4 days of dyspnea, complicated by fever and nonproductive cough, generalized weakness, nausea and headaches.  Increased oxygen requirement from 2 L to 5 L/min per nasal cannula.  Diagnosed with COPD exacerbation 4/16 in the emergency department.  On her initial physical examination blood pressure 115/72, heart rate 117, respiratory rate 25, oxygen saturation 96% on supplemental oxygen, she had dry mucous membranes, her lungs had no significant rails, increased respiratory effort and positive accessory muscle use, heart S1-S2, present, rhythmic, tachycardic, abdomen soft, no lower extremity edema.  Sodium 137, potassium 4.0, chloride 99, bicarb 28, glucose 140, BUN 44, creatinine 0.99, white count 20.4, hemoglobin 9.5, hematocrit 33.4, platelets 313.  Chest radiograph with no infiltrates. #1 EKG 124 bpm, normal axis, normal intervals, sinus rhythm, no ST segment or T wave changes.  #2 186 bpm, normal axis, narrow QRS, SVT/AVNRT, no ST segment or T wave changes.   #3 (post adenosine) 118, normal axis, normal intervals, sinus rhythm, no ST segment or T wave changes.   Assessment & Plan:   Principal Problem:   Acute on chronic respiratory failure with hypoxia (HCC) Active Problems:   Bronchitis, mucopurulent recurrent (HCC)   Asthma, mild intermittent   GERD (gastroesophageal reflux disease)   Renal transplant  recipient   Hyperlipidemia   Depression   Chronic diastolic CHF (congestive heart failure) (HCC)   Essential hypertension   Glaucoma   SVT (supraventricular tachycardia) (HCC)   Anemia of chronic disease   1. Acute on chronic hypoxic respiratory failure, due to COPD exacerbation. Follow up chest film personally reviewed with no frank infiltrate, ruling our pneumonia. Oxygenation is 100% on 2 L/ min per [place]. Wbc is down to 12,7. Blood cultures with no growth.   Will continue oxymetry monitoring and supplemental 02 to target 02 saturation more than 88%. Continue with aggressive bronchodilators and airway clearing techniques with flutter valve and incentive spirometer. Will narrow antibiotic therapy to oral doxycyline for 5 doses. Change systemic steroids to methylprednislone 40 mg daily. Continue inhaled corticosteroids/ long acting b agonist with fluticoasone and vilanterol.   2. Acute SVT/ AVNRT. Patient continue on sinus rhythm, will continue telemetry monitoring. Will increase dose of verapamil to 240 mg daily.   3. Polycystic renal disease sp renal transplant. Continue mycophenolate and tacroliums per home regimen.    4. Anxiety. Continue with clonazepam and fluoxetine.     DVT prophylaxis: Enoxaparin  Code Status:    full  Family Communication:  I spoke with patient's daughter at the bedside, we talked in detail about patient's condition, plan of care and prognosis and all questions were addressed.   Disposition Plan:   Patient is from:  snf   Anticipated DC to:  snf   Anticipated DC date:  To be determined   Anticipated DC barriers: Acute hypoxic respiratory failure, with increase oxygen requirements. Recent hospitalization.         Subjective: Patient continue to have dyspnea, but mild improvement, not yet  back to her baseline, no nausea or vomiting, positive cough and congestion. Quit smoking 30 years ago.   Objective: Vitals:   03/14/20 0800 03/14/20 0805 03/14/20  0900 03/14/20 1000  BP: 119/60     Pulse: 87 87 87 89  Resp: (!) 21 20 19 19   Temp: 98.8 F (37.1 C)     TempSrc: Axillary     SpO2: 100%  100% 99%  Weight:      Height:        Intake/Output Summary (Last 24 hours) at 03/14/2020 1057 Last data filed at 03/14/2020 1020 Gross per 24 hour  Intake 1249.44 ml  Output 1925 ml  Net -675.56 ml   Filed Weights   03/13/20 1043  Weight: 72.1 kg    Examination:   General: Not in pain or dyspnea, deconditioned and ill looking appearing  Neurology: Awake and alert, non focal  E ENT: mild pallor, no icterus, oral mucosa moist Cardiovascular: No JVD. S1-S2 present, rhythmic, no gallops, rubs, or murmurs. No lower extremity edema. Pulmonary: positive breath sounds bilaterally,decreased air movement, no wheezing, bilateral rhonchi and rales. Gastrointestinal. Abdomen with no organomegaly, non tender, no rebound or guarding Skin. No rashes Musculoskeletal: no joint deformities     Data Reviewed: I have personally reviewed following labs and imaging studies  CBC: Recent Labs  Lab 03/13/20 1044 03/14/20 0406  WBC 20.4* 12.7*  NEUTROABS 17.3*  --   HGB 9.5* 8.4*  HCT 33.4* 29.2*  MCV 99.1 98.6  PLT 313 914   Basic Metabolic Panel: Recent Labs  Lab 03/13/20 1044 03/13/20 1500 03/14/20 0406  NA 137  --  140  K 4.0  --  4.4  CL 99  --  105  CO2 28  --  24  GLUCOSE 140*  --  177*  BUN 44*  --  35*  CREATININE 0.99  --  0.84  CALCIUM 9.6  --  9.6  MG  --  1.5*  --    GFR: Estimated Creatinine Clearance: 63 mL/min (by C-G formula based on SCr of 0.84 mg/dL). Liver Function Tests: Recent Labs  Lab 03/13/20 1044  AST 28  ALT 15  ALKPHOS 99  BILITOT 1.3*  PROT 7.8  ALBUMIN 2.7*   No results for input(s): LIPASE, AMYLASE in the last 168 hours. No results for input(s): AMMONIA in the last 168 hours. Coagulation Profile: Recent Labs  Lab 03/13/20 1044  INR 1.2   Cardiac Enzymes: No results for input(s): CKTOTAL,  CKMB, CKMBINDEX, TROPONINI in the last 168 hours. BNP (last 3 results) No results for input(s): PROBNP in the last 8760 hours. HbA1C: No results for input(s): HGBA1C in the last 72 hours. CBG: No results for input(s): GLUCAP in the last 168 hours. Lipid Profile: No results for input(s): CHOL, HDL, LDLCALC, TRIG, CHOLHDL, LDLDIRECT in the last 72 hours. Thyroid Function Tests: No results for input(s): TSH, T4TOTAL, FREET4, T3FREE, THYROIDAB in the last 72 hours. Anemia Panel: No results for input(s): VITAMINB12, FOLATE, FERRITIN, TIBC, IRON, RETICCTPCT in the last 72 hours.    Radiology Studies: I have reviewed all of the imaging during this hospital visit personally     Scheduled Meds: . allopurinol  300 mg Oral Daily  . aspirin  81 mg Oral Daily  . brimonidine  1 drop Left Eye BID  . cholecalciferol  1,000 Units Oral Daily  . cycloSPORINE  1 drop Both Eyes BID  . dorzolamide  1 drop Both Eyes Daily  . doxycycline  100 mg Oral Q12H  . feeding supplement (ENSURE ENLIVE)  237 mL Oral BID BM  . FLUoxetine  40 mg Oral Daily  . fluticasone furoate-vilanterol  1 puff Inhalation Daily  . gabapentin  200 mg Oral TID  . guaiFENesin  600 mg Oral BID  . heparin  5,000 Units Subcutaneous Q8H  . hydrocortisone sod succinate (SOLU-CORTEF) inj  50 mg Intravenous Q6H  . ipratropium  0.5 mg Nebulization TID  . latanoprost  1 drop Both Eyes QHS  . methocarbamol  500 mg Oral QID  . multivitamin with minerals  1 tablet Oral Daily  . mycophenolate  180 mg Oral BID  . pantoprazole  40 mg Oral BID  . saccharomyces boulardii  250 mg Oral BID  . tacrolimus  0.5 mg Oral QHS  . tacrolimus  1 mg Oral Daily  . verapamil  180 mg Oral Daily  . vitamin B-12  100 mcg Oral Daily   Continuous Infusions: . ceFEPime (MAXIPIME) IV 2 g (03/13/20 2103)     LOS: 1 day        Summer Parthasarathy Gerome Apley, MD

## 2020-03-14 NOTE — Evaluation (Addendum)
Physical Therapy Evaluation Patient Details Name: April [name] MRN: [id number] DOB: 02-24-1956 Today's Date: 03/14/2020   History of Present Illness  64 y.o. female with past medical history significant for CHF, PCKD status post renal transplant on immunosuppressants, hyperlipidemia, condyloma, presenting to emergency department on 4/21 from SNF with shortness of breath x4 days secondary to PNA. Pt hospitalized 11/2019 secondary to fall s/p L THA and ORIF wrist fractre, 3/10-3/16 for sepsis secondary to bronchitis.  Clinical Impression   Pt presents with total body weakness, fair sitting balance, poor to zero standing balance, difficulty performing mobility tasks, and decreased activity tolerance. Pt to benefit from acute PT to address deficits. Pt required min-max assist for transfer to recliner, pt unable to come to full standing with attempts. VSS throughout, on 2LO2. PT recommending return to SNF to continue therapeutic efforts and work towards pt goals of getting stronger. PT to progress mobility as tolerated, and will continue to follow acutely.      Follow Up Recommendations SNF    Equipment Recommendations  None recommended by PT    Recommendations for Other Services       Precautions / Restrictions Precautions Precautions: Fall Restrictions Weight Bearing Restrictions: No LUE Weight Bearing: Weight bearing as tolerated LLE Weight Bearing: Weight bearing as tolerated      Mobility  Bed Mobility Overal bed mobility: Needs Assistance Bed Mobility: Supine to Sit     Supine to sit: Min assist     General bed mobility comments: Min assist for lifting LEs and trunk elevation, pt using RLE under LLE to hook and lift when moving to EOB. Pt with use of bedrails and HOB elevation.  Transfers Overall transfer level: Needs assistance Equipment used: 1 person hand held assist  Transfers: Sit to/from [employer] Sit to Stand: Max assist;From elevated  surface   Squat pivot transfers: Max assist     General transfer comment: max assist for attempted stand for power up, steadying. Pt unable to come to upright standing this day, so performed squat pivot to recliner with max assist for truncal support, body translation, and scooting pt back in chair upon arrival to recliner. Pt good at initiating scoot and assisting with UEs.  Ambulation/Gait             General Gait Details: unable this day  Stairs            Wheelchair Mobility    Modified Rankin (Stroke Patients Only)       Balance Overall balance assessment: Needs assistance;History of Falls Sitting-balance support: Feet supported;Bilateral upper extremity supported Sitting balance-Leahy Scale: Fair     Standing balance support: During functional activity Standing balance-Leahy Scale: Zero Standing balance comment: unable to come to upright standing with max assist                             Pertinent Vitals/Pain Pain Assessment: Faces Faces Pain Scale: Hurts a little bit Pain Location: L hip, with mobility to EOB Pain Descriptors / Indicators: Discomfort Pain Intervention(s): Limited activity within patient's tolerance;Monitored during session;Repositioned    Home Living Family/patient expects to be discharged to:: Private residence Living Arrangements: Spouse/significant other;Parent;Children Available Help at Discharge: Family;Available 24 hours/day Type of Home: House Home Access: Ramped entrance     Home Layout: Two level;Able to live on main level with bedroom/bathroom Home Equipment: Gilford Rile - 2 wheels;Wheelchair - [employer];Shower seat;Adaptive equipment;Hospital bed Additional Comments: above information is  pt home set up gathered per chart review, pt has been at [facility] since March and has been mobilizing very little secondary to sickness.    Prior Function Level of Independence: Needs assistance   Gait / Transfers  Assistance Needed: Pt reports working on transfers with PT/OT with use of stedy, pt is at transfer level and states she has not been able to progress to gait recently.  ADL's / Homemaking Assistance Needed: Pt reports needing assist for LB bathing, dressing self, and pericare.        Hand Dominance   Dominant Hand: Right    Extremity/Trunk Assessment   Upper Extremity Assessment Upper Extremity Assessment: Generalized weakness    Lower Extremity Assessment Lower Extremity Assessment: Generalized weakness;LLE deficits/detail LLE Deficits / Details: more effortful AROM, ~3/5 strength sitting EOB. Pt states "it has been significantly weaker since fall and surgery"    Cervical / Trunk Assessment Cervical / Trunk Assessment: Normal  Communication   Communication: No difficulties  Cognition Arousal/Alertness: Awake/alert Behavior During Therapy: WFL for tasks assessed/performed Overall Cognitive Status: Within Functional Limits for tasks assessed                                        General Comments General comments (skin integrity, edema, etc.): on 2LO2, VSS. HRmax 105 bpm, Spo2 98% and above, RRmax 28.    Exercises General Exercises - Lower Extremity Long Arc Quad: AROM;Both;5 reps;Seated   Assessment/Plan    PT Assessment Patient needs continued PT services  PT Problem List Decreased strength;Decreased activity tolerance;Decreased balance;Decreased mobility;Decreased knowledge of use of DME;Pain;Decreased coordination;Cardiopulmonary status limiting activity       PT Treatment Interventions DME instruction;Gait training;Functional mobility training;Therapeutic activities;Therapeutic exercise;Balance training;Neuromuscular re-education;Patient/family education;Wheelchair mobility training    PT Goals (Current goals can be found in the Care Plan section)  Acute Rehab PT Goals Patient Stated Goal: to get stronger PT Goal Formulation: With patient Time  For Goal Achievement: 03/28/20 Potential to Achieve Goals: Fair    Frequency Min 2X/week   Barriers to discharge        Co-evaluation               AM-PAC PT "6 Clicks" Mobility  Outcome Measure Help needed turning from your back to your side while in a flat bed without using bedrails?: A Little Help needed moving from lying on your back to sitting on the side of a flat bed without using bedrails?: A Lot Help needed moving to and from a bed to a chair (including a wheelchair)?: Total Help needed standing up from a chair using your arms ([name].g., wheelchair or bedside chair)?: Total Help needed to walk in hospital room?: Total Help needed climbing 3-5 steps with a railing? : Total 6 Click Score: 9    End of Session Equipment Utilized During Treatment: Gait belt Activity Tolerance: Patient limited by fatigue Patient left: in chair;with call bell/phone within reach;with chair alarm set Nurse Communication: Mobility status PT Visit Diagnosis: Muscle weakness (generalized) (M62.81);History of falling (Z91.81);Difficulty in walking, not elsewhere classified (R26.2)    Time: 3888-2800 PT Time Calculation (min) (ACUTE ONLY): 40 min   Charges:   PT Evaluation $PT Eval Low Complexity: 1 Low PT Treatments $Therapeutic Activity: 23-37 mins        April [name], PT Acute Rehabilitation Services Pager [phone]  Office [phone]   April [name] Elonda Husky 03/14/2020, 12:13 PM

## 2020-03-14 NOTE — Plan of Care (Signed)
  Problem: Coping: Goal: Ability to cope will improve Outcome: Progressing Goal: Level of anxiety will decrease Outcome: Progressing   Problem: Respiratory: Goal: Diagnostic test results will improve Outcome: Progressing Goal: Identification of resources available to assist in meeting health care needs will improve Outcome: Progressing Goal: Ability to maintain adequate oxygenation and ventilation will improve Outcome: Progressing Goal: Ability to maintain a clear airway will improve Outcome: Progressing

## 2020-03-14 NOTE — Plan of Care (Signed)
Plan of care reviewed with pt and daughter at bedside. Pt drowsy. Up with PT to chair for a few hours. HR SB, BP soft, MD aware. Call bell in reach. Alarms on. Fall bundle in place. Pt stable at this time, will continue to monitor.  Problem: Coping: Goal: Ability to cope will improve Outcome: Progressing Goal: Level of anxiety will decrease Outcome: Progressing   Problem: Respiratory: Goal: Diagnostic test results will improve Outcome: Progressing Goal: Identification of resources available to assist in meeting health care needs will improve Outcome: Progressing Goal: Ability to maintain adequate oxygenation and ventilation will improve Outcome: Progressing Goal: Ability to maintain a clear airway will improve Outcome: Progressing

## 2020-03-15 DIAGNOSIS — E785 Hyperlipidemia, unspecified: Secondary | ICD-10-CM

## 2020-03-15 LAB — LEGIONELLA PNEUMOPHILA SEROGP 1 UR AG: L. pneumophila Serogp 1 Ur Ag: NEGATIVE

## 2020-03-15 LAB — MYCOPLASMA PNEUMONIAE ANTIBODY, IGM: Mycoplasma pneumo IgM: <770 U/mL (ref 0–769)

## 2020-03-15 LAB — URINE CULTURE: Culture: >=100000 — AB

## 2020-03-15 MED ORDER — IPRATROPIUM-ALBUTEROL 0.5-2.5 (3) MG/3ML IN SOLN
3.0000 mL | Freq: Two times a day (BID) | RESPIRATORY_TRACT | Status: DC
  Administered 2020-03-15 – 2020-03-18 (×7): 3 mL via RESPIRATORY_TRACT
  Filled 2020-03-15 (×7): qty 3

## 2020-03-15 MED ORDER — ENOXAPARIN SODIUM 40 MG/0.4ML ~~LOC~~ SOLN
40.0000 mg | SUBCUTANEOUS | Status: DC
  Administered 2020-03-15 – 2020-03-18 (×4): 40 mg via SUBCUTANEOUS
  Filled 2020-03-15 (×4): qty 0.4

## 2020-03-15 MED ORDER — METHOCARBAMOL 500 MG PO TABS: 500.0000 mg | ORAL_TABLET | Freq: Three times a day (TID) | ORAL | Status: DC | PRN

## 2020-03-15 NOTE — Evaluation (Signed)
Occupational Therapy Evaluation Patient Details Name: April [name] MRN: [id number] DOB: 1956-08-06 Today's Date: 03/15/2020    History of Present Illness 64 y.o. female with past medical history significant for CHF, PCKD status post renal transplant on immunosuppressants, hyperlipidemia, condyloma, presenting to emergency department on 4/21 from SNF with shortness of breath x4 days secondary to PNA. Pt hospitalized 11/2019 secondary to fall s/p L THA and ORIF wrist fractre, 3/10-3/16 for sepsis secondary to bronchitis.   Clinical Impression   Pt readmitted from SNF where she was receiving rehab, although she reports she could not fully participate due to illness. Pt presents with generalized weakness and impaired standing balance requiring 2 person assist to stand and take several steps toward HOB. Pt requires set up to total assist for ADL. Pt is agreeable to further SNF level therapy prior to return home. Will follow acutely.    Follow Up Recommendations  SNF;Supervision/Assistance - 24 hour    Equipment Recommendations  None recommended by OT    Recommendations for Other Services       Precautions / Restrictions Precautions Precautions: Fall Restrictions Weight Bearing Restrictions: No      Mobility Bed Mobility               General bed mobility comments: pt sitting at EOB upon arrival  Transfers Overall transfer level: Needs assistance Equipment used: 2 person hand held assist Transfers: Sit to/from Stand Sit to Stand: +2 physical assistance;Mod assist              Balance Overall balance assessment: Needs assistance;History of Falls   Sitting balance-Leahy Scale: Fair       Standing balance-Leahy Scale: Poor Standing balance comment: able to stand fully upright with B UE support                           ADL either performed or assessed with clinical judgement   ADL Overall ADL's : Needs assistance/impaired Eating/Feeding: Set  up;Sitting   Grooming: Set up;Sitting   Upper Body Bathing: Minimal assistance;Sitting   Lower Body Bathing: Total assistance;+2 for physical assistance;Sit to/from stand   Upper Body Dressing : Sitting;Minimal assistance   Lower Body Dressing: Total assistance;+2 for physical assistance;Sit to/from stand       Toileting- Water quality scientist and Hygiene: Total assistance;+2 for physical assistance       Functional mobility during ADLs: Moderate assistance;+2 for physical assistance(took several steps toward [facility])       Vision Baseline Vision/History: Wears glasses Wears Glasses: At all times Patient Visual Report: No change from baseline       Perception     Praxis      Pertinent Vitals/Pain Pain Assessment: Faces Faces Pain Scale: No hurt     Hand Dominance Right   Extremity/Trunk Assessment Upper Extremity Assessment Upper Extremity Assessment: Generalized weakness       Cervical / Trunk Assessment Cervical / Trunk Assessment: Normal   Communication Communication Communication: No difficulties   Cognition Arousal/Alertness: Awake/alert Behavior During Therapy: WFL for tasks assessed/performed Overall Cognitive Status: Within Functional Limits for tasks assessed                                     General Comments       Exercises     Shoulder Instructions      Home Living Family/patient expects to be discharged  to:: Skilled nursing facility                                 Additional Comments: pt coming from rehab at [name]      Prior Functioning/Environment Level of Independence: Needs assistance  Gait / Transfers Assistance Needed: Pt reports working on transfers with PT/OT with use of stedy, pt is at transfer level and states she has not been able to progress to gait recently. ADL's / Homemaking Assistance Needed: Pt reports needing assist for LB bathing, dressing self, and pericare.            OT  Problem List: Decreased strength;Decreased activity tolerance;Impaired balance (sitting and/or standing);Decreased coordination;Decreased knowledge of use of DME or AE;Pain      OT Treatment/Interventions: Self-care/ADL training;Therapeutic exercise;Therapeutic activities;Patient/family education;Balance training;DME and/or AE instruction    OT Goals(Current goals can be found in the care plan section) Acute Rehab OT Goals Patient Stated Goal: to get stronger OT Goal Formulation: With patient Time For Goal Achievement: 03/29/20 Potential to Achieve Goals: Good ADL Goals Pt Will Perform Upper Body Bathing: with set-up;sitting Pt Will Perform Upper Body Dressing: with set-up;sitting Pt Will Transfer to Toilet: with mod assist;stand pivot transfer;bedside commode Pt Will Perform Toileting - Clothing Manipulation and hygiene: with mod assist;sit to/from stand Pt/caregiver will Perform Home Exercise Program: Increased strength;With theraband;Both right and left upper extremity;Independently(level 2) Additional ADL Goal #1: Pt will stand with moderate assistance and maintain x 30 seconds as a precursor to ADL.  OT Frequency: Min 2X/week   Barriers to D/C:            Co-evaluation              AM-PAC OT "6 Clicks" Daily Activity     Outcome Measure Help from another person eating meals?: None Help from another person taking care of personal grooming?: A Little Help from another person toileting, which includes using toliet, bedpan, or urinal?: Total Help from another person bathing (including washing, rinsing, drying)?: A Lot Help from another person to put on and taking off regular upper body clothing?: A Little Help from another person to put on and taking off regular lower body clothing?: Total 6 Click Score: 14   End of Session    Activity Tolerance: Patient tolerated treatment well Patient left: in bed;with call bell/phone within reach;with family/visitor present;with bed  alarm set  OT Visit Diagnosis: Muscle weakness (generalized) (M62.81);History of falling (Z91.81);Unsteadiness on feet (R26.81)                Time: 8850-2774 OT Time Calculation (min): 15 min Charges:  OT General Charges $OT Visit: 1 Visit OT Evaluation $OT Eval Moderate Complexity: 1 Mod  Nestor Lewandowsky, OTR/L Acute Rehabilitation Services Pager: [phone] Office: [phone]  April [name] 03/15/2020, 4:03 PM

## 2020-03-15 NOTE — Progress Notes (Signed)
PROGRESS NOTE    RAZIA SCREWS  DDU:[id number] DOB: [birth year] DOA: 03/13/2020 PCP: [name], MD    Brief Narrative:  Patient was admitted to the hospital with a working diagnosis of acute on chronic hypoxic respiratory failure due to COPD exacerbation, complicated by SVT, to rule out community acquired pneumonia.   64 year old female who presented with dyspnea.  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  She presented with 4 days of dyspnea, complicated by fever and nonproductive cough, generalized weakness, nausea and headaches.  Increased oxygen requirement from 2 L to 5 L/min per nasal cannula.  Diagnosed with COPD exacerbation 4/16 in the emergency department.  On her initial physical examination blood pressure 115/72, heart rate 117, respiratory rate 25, oxygen saturation 96% on supplemental oxygen, she had dry mucous membranes, her lungs had no significant rails, increased respiratory effort and positive accessory muscle use, heart S1-S2, present, rhythmic, tachycardic, abdomen soft, no lower extremity edema.  Sodium 137, potassium 4.0, chloride 99, bicarb 28, glucose 140, BUN 44, creatinine 0.99, white count 20.4, hemoglobin 9.5, hematocrit 33.4, platelets 313.  Chest radiograph with no infiltrates. #1 EKG 124 bpm, normal axis, normal intervals, sinus rhythm, no ST segment or T wave changes.  #2 186 bpm, normal axis, narrow QRS, SVT/AVNRT, no ST segment or T wave changes.   #3 (post adenosine) 118, normal axis, normal intervals, sinus rhythm, no ST segment or T wave changes.     Assessment & Plan:   Principal Problem:   Acute on chronic respiratory failure with hypoxia (HCC) Active Problems:   Bronchitis, mucopurulent recurrent (HCC)   Asthma, mild intermittent   GERD (gastroesophageal reflux disease)   Renal  transplant recipient   Hyperlipidemia   Depression   Chronic diastolic CHF (congestive heart failure) (HCC)   Essential hypertension   Glaucoma   SVT (supraventricular tachycardia) (HCC)   Anemia of chronic disease    1. Acute on chronic hypoxic respiratory failure, due to COPD exacerbation. Follow up chest film personally reviewed with no frank infiltrate, ruling our pneumonia. Improved oxygen requirements down to 1 l/ min per [place] with oxygenation up to 100%.   Continue with aggressive bronchodilators and airway clearing techniques with flutter valve and incentive spirometer. On oral doxycyline for 5 doses  and systemic steroids to methylprednislone 40 mg daily. On inhaled corticosteroids/ long acting b agonist with fluticoasone and vilanterol.   Out of bed to chair tid with meals and physical therapy evaluation.   2. Acute SVT/ AVNRT. Remains on sinus rhythm, will continue telemetry monitoring. Verapamil to 240 mg daily. Blood pressure has been on the low normal, will continue AV blockade as long as MAP more than 60. Patient has a high risk for recurrent SVT.   3. Polycystic renal disease sp renal transplant. On mycophenolate and tacroliums per home regimen. Follow up on renal function in am.   4. Anxiety. On clonazepam and fluoxetine.   5. Right lower extremity edema. Will check US doppler to rule out DVT. Will change methocarbamol to as needed regimen.    DVT prophylaxis:      Enoxaparin  Code Status:               full  Family Communication:       I spoke with patient's daughter at the bedside, we talked in detail about patient's condition, plan of care and prognosis and  all questions were addressed.   Disposition Plan:              Patient is from:                        snf              Anticipated DC to:                   snf              Anticipated DC date:               To be determined              Anticipated DC barriers:         Acute hypoxic respiratory failure,  with increase oxygen requirements. Recent hospitalization.       Subjective: Patient's dyspnea continue to improve, no nausea or vomiting, no chest pain, continue to be very weak and deconditioned   Objective: Vitals:   03/15/20 0327 03/15/20 0740 03/15/20 0748 03/15/20 0847  BP: (!) 90/47 (!) 100/53  (!) 98/50  Pulse: 77 79 76   Resp: 16 20 16    Temp: 98.6 F (37 C) 98.9 F (37.2 C)    TempSrc: Oral Oral    SpO2: 100% 100% 100%   Weight:      Height:        Intake/Output Summary (Last 24 hours) at 03/15/2020 1327 Last data filed at 03/14/2020 2100 Gross per 24 hour  Intake 0 ml  Output 300 ml  Net -300 ml   Filed Weights   03/13/20 1043  Weight: 72.1 kg    Examination:   General: Not in pain or dyspnea, deconditioned  Neurology: Awake and alert, non focal  E ENT: positive pallor, no icterus, oral mucosa moist Cardiovascular: No JVD. S1-S2 present, rhythmic, no gallops, rubs, or murmurs. Trace bilateral lower extremity edema, more left than right Pulmonary: positive breath sounds bilaterally,decreased air movement, no wheezing, scattered rhonchi and rales. Gastrointestinal. Abdomen with no organomegaly, non tender, no rebound or guarding Skin. No rashes Musculoskeletal: no joint deformities     Data Reviewed: I have personally reviewed following labs and imaging studies  CBC: Recent Labs  Lab 03/13/20 1044 03/14/20 0406  WBC 20.4* 12.7*  NEUTROABS 17.3*  --   HGB 9.5* 8.4*  HCT 33.4* 29.2*  MCV 99.1 98.6  PLT 313 509   Basic Metabolic Panel: Recent Labs  Lab 03/13/20 1044 03/13/20 1500 03/14/20 0406  NA 137  --  140  K 4.0  --  4.4  CL 99  --  105  CO2 28  --  24  GLUCOSE 140*  --  177*  BUN 44*  --  35*  CREATININE 0.99  --  0.84  CALCIUM 9.6  --  9.6  MG  --  1.5*  --    GFR: Estimated Creatinine Clearance: 63 mL/min (by C-G formula based on SCr of 0.84 mg/dL). Liver Function Tests: Recent Labs  Lab 03/13/20 1044  AST 28  ALT 15   ALKPHOS 99  BILITOT 1.3*  PROT 7.8  ALBUMIN 2.7*   No results for input(s): LIPASE, AMYLASE in the last 168 hours. No results for input(s): AMMONIA in the last 168 hours. Coagulation Profile: Recent Labs  Lab 03/13/20 1044  INR 1.2   Cardiac Enzymes: No results for input(s): CKTOTAL, CKMB, CKMBINDEX, TROPONINI in the last 168  hours. BNP (last 3 results) No results for input(s): PROBNP in the last 8760 hours. HbA1C: No results for input(s): HGBA1C in the last 72 hours. CBG: No results for input(s): GLUCAP in the last 168 hours. Lipid Profile: No results for input(s): CHOL, HDL, LDLCALC, TRIG, CHOLHDL, LDLDIRECT in the last 72 hours. Thyroid Function Tests: No results for input(s): TSH, T4TOTAL, FREET4, T3FREE, THYROIDAB in the last 72 hours. Anemia Panel: No results for input(s): VITAMINB12, FOLATE, FERRITIN, TIBC, IRON, RETICCTPCT in the last 72 hours.    Radiology Studies: I have reviewed all of the imaging during this hospital visit personally     Scheduled Meds: . allopurinol  300 mg Oral Daily  . aspirin  81 mg Oral Daily  . brimonidine  1 drop Left Eye BID  . cholecalciferol  1,000 Units Oral Daily  . cycloSPORINE  1 drop Both Eyes BID  . dorzolamide  1 drop Both Eyes Daily  . doxycycline  100 mg Oral Q12H  . feeding supplement (ENSURE ENLIVE)  237 mL Oral BID BM  . FLUoxetine  40 mg Oral Daily  . fluticasone furoate-vilanterol  1 puff Inhalation Daily  . gabapentin  200 mg Oral TID  . guaiFENesin  600 mg Oral BID  . heparin  5,000 Units Subcutaneous Q8H  . ipratropium-albuterol  3 mL Nebulization BID  . latanoprost  1 drop Both Eyes QHS  . methocarbamol  500 mg Oral QID  . methylPREDNISolone (SOLU-MEDROL) injection  40 mg Intravenous Daily  . multivitamin with minerals  1 tablet Oral Daily  . mycophenolate  180 mg Oral BID  . pantoprazole  40 mg Oral BID  . saccharomyces boulardii  250 mg Oral BID  . tacrolimus  0.5 mg Oral QHS  . tacrolimus  1 mg  Oral Daily  . verapamil  240 mg Oral Daily  . verapamil  60 mg Oral Once  . vitamin B-12  100 mcg Oral Daily   Continuous Infusions:   LOS: 2 days        Elizabeth Paulsen Gerome Apley, MD

## 2020-03-16 ENCOUNTER — Inpatient Hospital Stay (HOSPITAL_COMMUNITY): Payer: Medicare Other

## 2020-03-16 DIAGNOSIS — Z940 Kidney transplant status: Secondary | ICD-10-CM

## 2020-03-16 DIAGNOSIS — R609 Edema, unspecified: Secondary | ICD-10-CM

## 2020-03-16 LAB — CBC WITH DIFFERENTIAL/PLATELET
Abs Immature Granulocytes: 0.08 10*3/uL — ABNORMAL HIGH (ref 0.00–0.07)
Basophils Absolute: 0 10*3/uL (ref 0.0–0.1)
Basophils Relative: 0 %
Eosinophils Absolute: 0 10*3/uL (ref 0.0–0.5)
Eosinophils Relative: 0 %
HCT: 25.7 % — ABNORMAL LOW (ref 36.0–46.0)
Hemoglobin: 7.5 g/dL — ABNORMAL LOW (ref 12.0–15.0)
Immature Granulocytes: 2 %
Lymphocytes Relative: 12 %
Lymphs Abs: 0.6 10*3/uL — ABNORMAL LOW (ref 0.7–4.0)
MCH: 28.6 pg (ref 26.0–34.0)
MCHC: 29.2 g/dL — ABNORMAL LOW (ref 30.0–36.0)
MCV: 98.1 fL (ref 80.0–100.0)
Monocytes Absolute: 0.2 10*3/uL (ref 0.1–1.0)
Monocytes Relative: 3 %
Neutro Abs: 4.3 10*3/uL (ref 1.7–7.7)
Neutrophils Relative %: 83 %
Platelets: 218 10*3/uL (ref 150–400)
RBC: 2.62 MIL/uL — ABNORMAL LOW (ref 3.87–5.11)
RDW: 21.2 % — ABNORMAL HIGH (ref 11.5–15.5)
WBC: 5.2 10*3/uL (ref 4.0–10.5)
nRBC: 0 % (ref 0.0–0.2)

## 2020-03-16 LAB — BASIC METABOLIC PANEL
Anion gap: 8 (ref 5–15)
BUN: 44 mg/dL — ABNORMAL HIGH (ref 8–23)
CO2: 24 mmol/L (ref 22–32)
Calcium: 9.9 mg/dL (ref 8.9–10.3)
Chloride: 103 mmol/L (ref 98–111)
Creatinine, Ser: 1 mg/dL (ref 0.44–1.00)
GFR calc Af Amer: >60 mL/min (ref >60)
GFR calc non Af Amer: >60 mL/min (ref >60)
Glucose, Bld: 129 mg/dL — ABNORMAL HIGH (ref 70–99)
Potassium: 4.4 mmol/L (ref 3.5–5.1)
Sodium: 135 mmol/L (ref 135–145)

## 2020-03-16 MED ORDER — METHYLPREDNISOLONE SODIUM SUCC 40 MG IJ SOLR
20.0000 mg | Freq: Every day | INTRAMUSCULAR | Status: DC
  Administered 2020-03-17 – 2020-03-18 (×2): 20 mg via INTRAVENOUS
  Filled 2020-03-16 (×2): qty 1

## 2020-03-16 NOTE — Progress Notes (Signed)
PROGRESS NOTE    JAILYNE CHIEFFO  TDS:[id number] DOB: 23-Aug-1956 DOA: 03/13/2020 PCP: [name], MD    Brief Narrative:  Patient was admitted to the hospital with a working diagnosis of acute on chronic hypoxic respiratory failure due to COPD exacerbation, complicated by SVT, to rule out community acquired pneumonia.  64 year old female who presented with dyspnea. 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 She presented with 4 days of dyspnea, complicated by fever and nonproductive cough, generalized weakness, nausea and headaches. Increased oxygen requirement from 2 L to 5 L/min per nasal cannula. Diagnosed with COPD exacerbation 4/16 in the emergency department. On her initial physical examination blood pressure 115/72, heart rate 117, respiratory rate 25, oxygen saturation 96% on supplemental oxygen, she had dry mucous membranes, her lungs had no significant rails, increased respiratory effort and positive accessory muscle use, heart S1-S2, present, rhythmic, tachycardic, abdomen soft, no lower extremity edema. Sodium 137, potassium 4.0, chloride 99, bicarb 28, glucose 140, BUN 44, creatinine 0.99,white count 20.4, hemoglobin 9.5, hematocrit 33.4, platelets 313.Chest radiograph with no infiltrates. #1EKG 124 bpm, normal axis, normal intervals, sinus rhythm, no ST segment or T wave changes.  #2186 bpm, normal axis, narrow QRS, SVT/AVNRT,no ST segment or T wave changes.  #3 (post adenosine) 118,normal axis, normal intervals, sinus rhythm, no ST segment or T wave changes.   Assessment & Plan:   Principal Problem:   Acute on chronic respiratory failure with hypoxia (HCC) Active Problems:   Bronchitis, mucopurulent recurrent (HCC)   Asthma, mild intermittent   GERD (gastroesophageal reflux disease)   Renal  transplant recipient   Hyperlipidemia   Depression   Chronic diastolic CHF (congestive heart failure) (HCC)   Essential hypertension   Glaucoma   SVT (supraventricular tachycardia) (HCC)   Anemia of chronic disease    1. Acute on chronic hypoxic respiratory failure, due to COPD exacerbation. Follow up chest film personally reviewed with no frank infiltrate, ruling our pneumonia. Continue to improve oxygenation, trial to wean to room air today, keep oxygen saturation more than 88%.   On aggressive bronchodilators and continue with airway clearing techniques with flutter valve and incentive spirometer. Tolerating well oral doxycyline for 5 doses, will decrease systemic steroids to methylprednislone 20 mg daily. Continue with inhaled corticosteroids/ long acting b agonist with fluticoasone and vilanterol.   Out of bed to chair tid with meals and physical therapy evaluation. Patient will return to SNF.   2. Acute SVT/ AVNRT. Telemetry personally reviewed and patient remains on sinus rhythm, continue telemetry monitoring. Tolerating well increased dose of Verapamil to 240 mg daily. Keep MAP 60 to 65.   3. Polycystic renal disease sp renal transplant. Continue with mycophenolate and tacroliums per home regimen. Stable renal function with serum cr at 1,0 and K at 4,4 with serum bicarbonate at 24.   4. Anxiety. Continue with clonazepam and fluoxetine.  5. Right lower extremity edema. Follow up with US doppler to rule out DVT. Continue with PRN methocarbamol for muscle spasms.     DVT prophylaxis:Enoxaparin Code Status:full Family Communication:I spoke with patient'sdaughterat the bedside, we talked in detail about patient's condition, plan of care and prognosis and all questions were addressed.  Disposition Plan: Patient is from:snf Anticipated DC to:snf Anticipated DC  date:To be determined Anticipated DC barriers:Acute hypoxic respiratory failure, with increase oxygen requirements. Recent hospitalization.   Subjective: Patient is feeling bette with  improved dyspnea, not yet back to baseline, no nausea or vomiting, no chest pain. Continue to be very weak and deconditioned   Objective: Vitals:   03/15/20 2332 03/16/20 0500 03/16/20 0755 03/16/20 0814  BP: (!) 115/59 104/61  (!) 100/56  Pulse: 88 72 73 77  Resp: 20 19 14  (!) 21  Temp: 97.6 F (36.4 C) (!) 97.5 F (36.4 C)  (!) 97.3 F (36.3 C)  TempSrc: Oral Oral  Oral  SpO2: 100% 100% 100% 100%  Weight:      Height:        Intake/Output Summary (Last 24 hours) at 03/16/2020 0840 Last data filed at 03/16/2020 0500 Gross per 24 hour  Intake --  Output 550 ml  Net -550 ml   Filed Weights   03/13/20 1043  Weight: 72.1 kg    Examination:   General: Not in pain or dyspnea, deconditioned  Neurology: Awake and alert, non focal  E ENT: mild pallor, no icterus, oral mucosa moist Cardiovascular: No JVD. S1-S2 present, rhythmic, no gallops, rubs, or murmurs. No lower extremity edema. Pulmonary: positive breath sounds bilaterally, decreased air movement, no wheezing, scattered rhonchi and rales. Gastrointestinal. Abdomen with no organomegaly, non tender, no rebound or guarding Skin. No rashes Musculoskeletal: no joint deformities     Data Reviewed: I have personally reviewed following labs and imaging studies  CBC: Recent Labs  Lab 03/13/20 1044 03/14/20 0406 03/16/20 0523  WBC 20.4* 12.7* 5.2  NEUTROABS 17.3*  --  4.3  HGB 9.5* 8.4* 7.5*  HCT 33.4* 29.2* 25.7*  MCV 99.1 98.6 98.1  PLT 313 211 578   Basic Metabolic Panel: Recent Labs  Lab 03/13/20 1044 03/13/20 1500 03/14/20 0406 03/16/20 0523  NA 137  --  140 135  K 4.0  --  4.4 4.4  CL 99  --  105 103  CO2 28  --  24 24  GLUCOSE 140*  --  177* 129*  BUN 44*  --  35* 44*   CREATININE 0.99  --  0.84 1.00  CALCIUM 9.6  --  9.6 9.9  MG  --  1.5*  --   --    GFR: Estimated Creatinine Clearance: 52.9 mL/min (by C-G formula based on SCr of 1 mg/dL). Liver Function Tests: Recent Labs  Lab 03/13/20 1044  AST 28  ALT 15  ALKPHOS 99  BILITOT 1.3*  PROT 7.8  ALBUMIN 2.7*   No results for input(s): LIPASE, AMYLASE in the last 168 hours. No results for input(s): AMMONIA in the last 168 hours. Coagulation Profile: Recent Labs  Lab 03/13/20 1044  INR 1.2   Cardiac Enzymes: No results for input(s): CKTOTAL, CKMB, CKMBINDEX, TROPONINI in the last 168 hours. BNP (last 3 results) No results for input(s): PROBNP in the last 8760 hours. HbA1C: No results for input(s): HGBA1C in the last 72 hours. CBG: No results for input(s): GLUCAP in the last 168 hours. Lipid Profile: No results for input(s): CHOL, HDL, LDLCALC, TRIG, CHOLHDL, LDLDIRECT in the last 72 hours. Thyroid Function Tests: No results for input(s): TSH, T4TOTAL, FREET4, T3FREE, THYROIDAB in the last 72 hours. Anemia Panel: No results for input(s): VITAMINB12, FOLATE, FERRITIN, TIBC, IRON, RETICCTPCT in the last 72 hours.    Radiology Studies: I have reviewed all of the imaging during this hospital visit personally     Scheduled Meds: . allopurinol  300 mg Oral Daily  . aspirin  81 mg Oral Daily  . brimonidine  1 drop Left Eye BID  .  cholecalciferol  1,000 Units Oral Daily  . cycloSPORINE  1 drop Both Eyes BID  . dorzolamide  1 drop Both Eyes Daily  . doxycycline  100 mg Oral Q12H  . enoxaparin (LOVENOX) injection  40 mg Subcutaneous Q24H  . feeding supplement (ENSURE ENLIVE)  237 mL Oral BID BM  . FLUoxetine  40 mg Oral Daily  . fluticasone furoate-vilanterol  1 puff Inhalation Daily  . gabapentin  200 mg Oral TID  . guaiFENesin  600 mg Oral BID  . ipratropium-albuterol  3 mL Nebulization BID  . latanoprost  1 drop Both Eyes QHS  . methylPREDNISolone (SOLU-MEDROL) injection  40 mg  Intravenous Daily  . multivitamin with minerals  1 tablet Oral Daily  . mycophenolate  180 mg Oral BID  . pantoprazole  40 mg Oral BID  . saccharomyces boulardii  250 mg Oral BID  . tacrolimus  0.5 mg Oral QHS  . tacrolimus  1 mg Oral Daily  . verapamil  240 mg Oral Daily  . verapamil  60 mg Oral Once  . vitamin B-12  100 mcg Oral Daily   Continuous Infusions:   LOS: 3 days        Herberth Deharo Gerome Apley, MD

## 2020-03-16 NOTE — Progress Notes (Signed)
Bilateral lower extremity venous duplex completed. Refer to "CV Proc" under chart review to view preliminary results.  03/16/2020 11:20 AM Kelby Aline., MHA, RVT, RDCS, RDMS

## 2020-03-17 LAB — RESPIRATORY PANEL BY RT PCR (FLU A&B, COVID)
Influenza A by PCR: NEGATIVE
Influenza B by PCR: NEGATIVE
SARS Coronavirus 2 by RT PCR: NEGATIVE

## 2020-03-17 LAB — BASIC METABOLIC PANEL
Anion gap: 9 (ref 5–15)
BUN: 52 mg/dL — ABNORMAL HIGH (ref 8–23)
CO2: 22 mmol/L (ref 22–32)
Calcium: 9.6 mg/dL (ref 8.9–10.3)
Chloride: 102 mmol/L (ref 98–111)
Creatinine, Ser: 1.01 mg/dL — ABNORMAL HIGH (ref 0.44–1.00)
GFR calc Af Amer: >60 mL/min (ref >60)
GFR calc non Af Amer: 60 mL/min — ABNORMAL LOW (ref >60)
Glucose, Bld: 177 mg/dL — ABNORMAL HIGH (ref 70–99)
Potassium: 4.6 mmol/L (ref 3.5–5.1)
Sodium: 133 mmol/L — ABNORMAL LOW (ref 135–145)

## 2020-03-17 NOTE — TOC Initial Note (Signed)
Transition of Care [facility]) - Initial/Assessment Note    Patient Details  Name: April [name] MRN: [id number] Date of Birth: [birth year]  Transition of Care [facility]) CM/SW Contact:    Jacquelynn Cree Phone Number: 03/17/2020, 4:29 PM  Clinical Narrative:                 CSW received consult for possible SNF placement at time of discharge.  Patient admitted from [facility] SNF. CSW met bedside with patient and spouse April [name] who was also present. Patient expressed she does not want to go back to April [name] and would like to see if she can be admitted to April [name] or another SNF.   CSW agreed to note that patient is interested in CIR and fax referrals to other SNFs No further questions expressed at this time. CSW will continue to follow.    Expected Discharge Plan: Skilled Nursing Facility Barriers to Discharge: Continued Medical Work up   Patient Goals and CMS Choice Patient states their goals for this hospitalization and ongoing recovery are:: to go to skilled nursing facility      Expected Discharge Plan and Services Expected Discharge Plan: [place]       Living arrangements for the past 2 months: [place]                                      Prior Living Arrangements/Services Living arrangements for the past 2 months: [name]   Patient language and need for interpreter reviewed:: Yes Do you feel safe going back to the place where you live?: No   SNFs  Need for Family Participation in Patient Care: Yes (Comment) Care giver support system in place?: Yes (comment)   Criminal Activity/Legal Involvement Pertinent to Current Situation/Hospitalization: No - Comment as needed  Activities of Daily Living      Permission Sought/Granted Permission sought to share information with : Case Manager, Customer service manager, Family Supports       Permission granted to share info w AGENCY: SNFs         Emotional Assessment       Orientation: : Oriented to Self, Oriented to Place, Oriented to  Time, Oriented to Situation Alcohol / Substance Use: Not Applicable Psych Involvement: No (comment)  Admission diagnosis:  PNA (pneumonia) [J18.9] Sepsis without acute organ dysfunction, due to unspecified organism [facility]) [A41.9] Patient Active Problem List   Diagnosis Date Noted  . Acute on chronic respiratory failure with hypoxia ([place]) 03/14/2020  . PNA (pneumonia) 03/13/2020  . SVT (supraventricular tachycardia) ([place])   . Anemia of chronic disease   . Palliative care encounter   . Goals of care, counseling/discussion   . Essential hypertension 02/08/2020  . Glaucoma 02/08/2020  . Sepsis ([place]) 01/31/2020  . Hypoxia   . Shortness of breath   . Fracture of left superior pubic ramus (HCC)   . Pain   . Anxiety state   . Left displaced femoral neck fracture ([place]) 12/15/2019  . Status post total hip replacement, left   . Post-op pain   . Acute blood loss anemia   . Polycystic kidney   . Closed left hip fracture ([place]) 12/07/2019  . Left wrist fracture 12/07/2019  . Medication management 09/22/2019  . Physical deconditioning 09/22/2019  . Hyponatremia 06/29/2019  . Acute respiratory failure with hypoxia ([place]) 04/19/2019  . Acute on chronic diastolic  CHF (congestive heart failure) ([place]) 04/19/2019  . Bilateral closed proximal tibial fracture 12/21/2018  . Closed right ankle fracture 12/21/2018  . Fracture 12/21/2018  . Lower urinary tract infectious disease 10/03/2018  . Asthma, chronic, unspecified asthma severity, with acute exacerbation 10/03/2018  . SIRS (systemic inflammatory response syndrome) ([place]) 10/03/2018  . Nausea vomiting and diarrhea 10/02/2018  . Chronic diastolic CHF (congestive heart failure) ([place]) 10/01/2017  . Influenza B 10/01/2017  . Persistent asthma with status asthmaticus   . Pneumonia 07/15/2016  . Asthma, mild intermittent 07/15/2016  . GERD  (gastroesophageal reflux disease) 07/15/2016  . Renal transplant recipient 07/15/2016  . Hyperlipidemia 07/15/2016  . Depression 07/15/2016  . Bronchitis, mucopurulent recurrent ([place]) 08/08/2014  . Chronic cough 07/24/2014  . End stage renal disease ([place]) 03/23/2012  . 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, implant, and graft 03/23/2012   PCP:  [name], MD Pharmacy:   [facility], [address] [place] [address] Phone: [phone] Fax: [phone]     Social Determinants of Health (SDOH) Interventions    Readmission Risk Interventions Readmission Risk Prevention Plan 02/09/2020 04/20/2019  Transportation Screening Complete Complete  PCP or Specialist Appt within 3-5 Days - Not Complete  Not Complete comments - not yet ready for d/c  HRI or [place] - Not Complete  HRI or Home Care Consult comments - no needs identified  Social Work Consult for [place] Planning/Counseling - Not Complete  SW consult not completed comments - no needs identified  Palliative Care Screening - Not Applicable  Medication Review (RN Care Manager) Referral to Pharmacy Complete  PCP or Specialist appointment within 3-5 days of discharge Complete -  [place] or Home Care Consult Complete -  [place] Patient Refused -  Some recent data might be hidden

## 2020-03-17 NOTE — NC FL2 (Signed)
[place] LEVEL OF CARE SCREENING TOOL     IDENTIFICATION  Patient Name: April [name] Birthdate: 1956/09/05 Sex: female Admission Date (Current Location): 03/13/2020  [facility] [place] Number:  Herbalist and Address:  [facility] [place], [place] [address] [place], [place] [address]      Provider Number: [id number]  Attending Physician Name and Address:  Tawni Millers  Relative Name and Phone Number:       Current Level of Care: Hospital Recommended Level of Care: [place] Prior Approval Number:    Date Approved/Denied:   PASRR Number: [id number]  Discharge Plan: SNF    Current Diagnoses: Patient Active Problem List   Diagnosis Date Noted  . Acute on chronic respiratory failure with hypoxia ([place]) 03/14/2020  . PNA (pneumonia) 03/13/2020  . SVT (supraventricular tachycardia) ([place])   . Anemia of chronic disease   . Palliative care encounter   . Goals of care, counseling/discussion   . Essential hypertension 02/08/2020  . Glaucoma 02/08/2020  . Sepsis ([place]) 01/31/2020  . Hypoxia   . Shortness of breath   . Fracture of left superior pubic ramus (HCC)   . Pain   . Anxiety state   . Left displaced femoral neck fracture ([place]) 12/15/2019  . Status post total hip replacement, left   . Post-op pain   . Acute blood loss anemia   . Polycystic kidney   . Closed left hip fracture ([place]) 12/07/2019  . Left wrist fracture 12/07/2019  . Medication management 09/22/2019  . Physical deconditioning 09/22/2019  . Hyponatremia 06/29/2019  . Acute respiratory failure with hypoxia ([place]) 04/19/2019  . Acute on chronic diastolic CHF (congestive heart failure) ([place]) 04/19/2019  . Bilateral closed proximal tibial fracture 12/21/2018  . Closed right ankle fracture 12/21/2018  . Fracture 12/21/2018  . Lower urinary tract infectious disease 10/03/2018  . Asthma, chronic, unspecified asthma severity, with acute  exacerbation 10/03/2018  . SIRS (systemic inflammatory response syndrome) ([place]) 10/03/2018  . Nausea vomiting and diarrhea 10/02/2018  . Chronic diastolic CHF (congestive heart failure) ([place]) 10/01/2017  . Influenza B 10/01/2017  . Persistent asthma with status asthmaticus   . Pneumonia 07/15/2016  . Asthma, mild intermittent 07/15/2016  . GERD (gastroesophageal reflux disease) 07/15/2016  . Renal transplant recipient 07/15/2016  . Hyperlipidemia 07/15/2016  . Depression 07/15/2016  . Bronchitis, mucopurulent recurrent ([place]) 08/08/2014  . Chronic cough 07/24/2014  . End stage renal disease ([place]) 03/23/2012  . 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, implant, and graft 03/23/2012    Orientation RESPIRATION BLADDER Height & Weight     Self, Time, Situation, Place  Normal Incontinent, External catheter Weight: 159 lb (72.1 kg) Height:  5\' 1"  (154.9 cm)  BEHAVIORAL SYMPTOMS/MOOD NEUROLOGICAL BOWEL NUTRITION STATUS      Continent Diet(See discharge summary)  AMBULATORY STATUS COMMUNICATION OF NEEDS Skin   Extensive Assist Verbally Normal                       Personal Care Assistance Level of Assistance  Bathing, Feeding, Dressing Bathing Assistance: Limited assistance Feeding assistance: Independent(able to feed self) Dressing Assistance: Limited assistance     Functional Limitations Info    Sight Info: Impaired Hearing Info: Adequate Speech Info: Adequate    SPECIAL CARE FACTORS FREQUENCY  PT (By licensed PT), OT (By licensed OT)     PT Frequency: 5x a week OT Frequency: 5x a week  Contractures Contractures Info: Not present    Additional Factors Info  Code Status, Allergies Code Status Info: Full Allergies Info: Infed (Iron Dextran), Pentamidine, Erythromycin (Erythromycin), Iohexol, Oxycodone, Erythromycin, Ultram (Tramadol Hcl)           Current Medications (03/17/2020):  This is the current hospital active medication  list Current Facility-Administered Medications  Medication Dose Route Frequency Provider Last Rate Last Admin  . acetaminophen (TYLENOL) tablet 650 mg  650 mg Oral Q6H PRN Wynetta Fines T, MD   650 mg at 03/14/20 1112   Or  . acetaminophen (TYLENOL) suppository 650 mg  650 mg Rectal Q6H PRN Wynetta Fines T, MD      . albuterol (PROVENTIL) (2.5 MG/3ML) 0.083% nebulizer solution 2.5 mg  2.5 mg Inhalation Q4H PRN Wynetta Fines T, MD      . allopurinol (ZYLOPRIM) tablet 300 mg  300 mg Oral Daily Wynetta Fines T, MD   300 mg at 03/17/20 1044  . aspirin chewable tablet 81 mg  81 mg Oral Daily Wynetta Fines T, MD   81 mg at 03/17/20 1044  . brimonidine (ALPHAGAN) 0.15 % ophthalmic solution 1 drop  1 drop Left Eye BID Wynetta Fines T, MD   1 drop at 03/17/20 1045  . cholecalciferol (VITAMIN D3) tablet 1,000 Units  1,000 Units Oral Daily Lequita Halt, MD   1,000 Units at 03/17/20 1043  . clonazePAM (KLONOPIN) disintegrating tablet 0.25 mg  0.25 mg Oral BID PRN Wynetta Fines T, MD      . cycloSPORINE (RESTASIS) 0.05 % ophthalmic emulsion 1 drop  1 drop Both Eyes BID Wynetta Fines T, MD   1 drop at 03/17/20 1045  . docusate sodium (COLACE) capsule 100 mg  100 mg Oral BID PRN Wynetta Fines T, MD      . dorzolamide (TRUSOPT) 2 % ophthalmic solution 1 drop  1 drop Both Eyes Daily Wynetta Fines T, MD   1 drop at 03/17/20 1045  . doxycycline (VIBRA-TABS) tablet 100 mg  100 mg Oral Q12H Wynetta Fines T, MD   100 mg at 03/17/20 1043  . enoxaparin (LOVENOX) injection 40 mg  40 mg Subcutaneous Q24H Arrien, Jimmy Picket, MD   40 mg at 03/16/20 1547  . feeding supplement (ENSURE ENLIVE) (ENSURE ENLIVE) liquid 237 mL  237 mL Oral BID BM Wynetta Fines T, MD   237 mL at 03/17/20 1044  . FLUoxetine (PROZAC) capsule 40 mg  40 mg Oral Daily Wynetta Fines T, MD   40 mg at 03/17/20 1043  . fluticasone furoate-vilanterol (BREO ELLIPTA) 200-25 MCG/INH 1 puff  1 puff Inhalation Daily Lequita Halt, MD   1 puff at 03/17/20 [phone]  . gabapentin  (NEURONTIN) capsule 200 mg  200 mg Oral TID Wynetta Fines T, MD   200 mg at 03/17/20 1043  . guaiFENesin (MUCINEX) 12 hr tablet 600 mg  600 mg Oral BID Wynetta Fines T, MD   600 mg at 03/17/20 1044  . ipratropium-albuterol (DUONEB) 0.5-2.5 (3) MG/3ML nebulizer solution 3 mL  3 mL Nebulization BID Arrien, Jimmy Picket, MD   3 mL at 03/17/20 0852  . latanoprost (XALATAN) 0.005 % ophthalmic solution 1 drop  1 drop Both Eyes QHS Lequita Halt, MD   1 drop at 03/16/20 2152  . methocarbamol (ROBAXIN) tablet 500 mg  500 mg Oral Q8H PRN Arrien, Jimmy Picket, MD      . methylPREDNISolone sodium succinate (SOLU-MEDROL) 40 mg/mL injection 20 mg  20 mg Intravenous Daily  Arrien, Jimmy Picket, MD   20 mg at 03/17/20 1045  . metoprolol tartrate (LOPRESSOR) injection 2.5 mg  2.5 mg Intravenous Q4H PRN Wynetta Fines T, MD      . multivitamin with minerals tablet 1 tablet  1 tablet Oral Daily Lequita Halt, MD   1 tablet at 03/17/20 1043  . mycophenolate (MYFORTIC) EC tablet 180 mg  180 mg Oral BID Wynetta Fines T, MD   180 mg at 03/17/20 1043  . ondansetron (ZOFRAN) injection 4 mg  4 mg Intravenous Q6H PRN Opyd, Ilene Qua, MD      . pantoprazole (PROTONIX) EC tablet 40 mg  40 mg Oral BID Wynetta Fines T, MD   40 mg at 03/17/20 1044  . polyethylene glycol (MIRALAX / GLYCOLAX) packet 17 g  17 g Oral Daily PRN Wynetta Fines T, MD      . saccharomyces boulardii (FLORASTOR) capsule 250 mg  250 mg Oral BID Wynetta Fines T, MD   250 mg at 03/17/20 1043  . sodium chloride (OCEAN) 0.65 % nasal spray 1 spray  1 spray Each Nare PRN Wynetta Fines T, MD      . tacrolimus (PROGRAF) capsule 0.5 mg  0.5 mg Oral QHS Wynetta Fines T, MD   0.5 mg at 03/16/20 2155  . tacrolimus (PROGRAF) capsule 1 mg  1 mg Oral Daily Wynetta Fines T, MD   1 mg at 03/17/20 1044  . verapamil (CALAN-SR) CR tablet 240 mg  240 mg Oral Daily Arrien, Jimmy Picket, MD   240 mg at 03/17/20 1044  . vitamin B-12 (CYANOCOBALAMIN) tablet 100 mcg  100 mcg Oral Daily Wynetta Fines T, MD   100 mcg at 03/17/20 1043     Discharge Medications: Please see discharge summary for a list of discharge medications.  Relevant Imaging Results:  Relevant Lab Results:   Additional Information SSN [id number]  Neysa Hotter [place], [place]

## 2020-03-17 NOTE — Progress Notes (Signed)
PROGRESS NOTE    April [name]  ZOX:[id number] DOB: [birth year] DOA: 03/13/2020 PCP: [name], MD    Brief Narrative:  Patient was admitted to the hospital with a working diagnosis of acute on chronic hypoxic respiratory failure due to COPD exacerbation, complicated by SVT, to rule out community acquired pneumonia.  65 year old female who presented with dyspnea. 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 She presented with 4 days of dyspnea, complicated by fever and nonproductive cough, generalized weakness, nausea and headaches. Increased oxygen requirement from 2 L to 5 L/min per nasal cannula. Diagnosed with COPD exacerbation 4/16 in the emergency department. On her initial physical examination blood pressure 115/72, heart rate 117, respiratory rate 25, oxygen saturation 96% on supplemental oxygen, she had dry mucous membranes, her lungs had no significant rails, increased respiratory effort and positive accessory muscle use, heart S1-S2, present, rhythmic, tachycardic, abdomen soft, no lower extremity edema. Sodium 137, potassium 4.0, chloride 99, bicarb 28, glucose 140, BUN 44, creatinine 0.99,white count 20.4, hemoglobin 9.5, hematocrit 33.4, platelets 313.Chest radiograph with no infiltrates. #1EKG 124 bpm, normal axis, normal intervals, sinus rhythm, no ST segment or T wave changes.  #2186 bpm, normal axis, narrow QRS, SVT/AVNRT,no ST segment or T wave changes.  #3 (post adenosine) 118,normal axis, normal intervals, sinus rhythm, no ST segment or T wave changes.  Patient responding well to respiratory treatment with bronchodilators, supplemental 02 and systemic steroids.   Tolerating well increased dose of verapamil. Plan to return to SNF in am.   Assessment & Plan:   Principal Problem:   Acute on  chronic respiratory failure with hypoxia (HCC) Active Problems:   Bronchitis, mucopurulent recurrent (HCC)   Asthma, mild intermittent   GERD (gastroesophageal reflux disease)   Renal transplant recipient   Hyperlipidemia   Depression   Chronic diastolic CHF (congestive heart failure) (HCC)   Essential hypertension   Glaucoma   SVT (supraventricular tachycardia) (HCC)   Anemia of chronic disease    1. Acute on chronic hypoxic respiratory failure, due to COPD exacerbation.Follow up chest film personally reviewed with no frank infiltrate, ruling our pneumonia. Today patient's oxygen saturation is 95% on room air and continue to improve dyspnea.   Continue with bronchodilators and  airway clearing techniques with flutter valve and incentive spirometer. Continue with doxycyline for 5 doses, systemic steroids to methylprednislone 20 mg daily, inhaled corticosteroids/ long acting b agonist with fluticoasone and vilanterol.   Out of bed to chair tid with meals and physical therapy evaluation.patient continue to be very weak and deconditioned, physical therapy has recommended patient to return to SNF.   2. Acute SVT/ AVNRT.Continue on sinus rhythm on telemetry monitoring. Continue with Verapamil  240 mg daily. Blood pressure has been stable systolic 811 mmHg.    3. Polycystic renal disease sp renal transplant.Onmycophenolate and tacroliums per home regimen. Stable renal function with serum cr at 1,01 and K at 4,6 with serum bicarbonate at 22.   4. Anxiety.On clonazepam and fluoxetine.  5. Right lower extremity edema. Negative for DVT per [place].  DVT prophylaxis:Enoxaparin Code Status:full Family Communication:no family at the bedside  Disposition Plan: Patient is from:snf Anticipated DC to:snf Anticipated DC date:04/26 Anticipated DC  barriers:Plan to return to SNF in am, if continue to improve oxygenation.         Subjective: Patient is feeling better, dyspnea continue to improve, no nausea or  vomiting, no chest pain, Continue to be very weak and deconditioned   Objective: Vitals:   03/17/20 0025 03/17/20 0406 03/17/20 0841 03/17/20 0853  BP: 123/63 124/64 (!) 119/58   Pulse: 80 77 80   Resp: (!) 22 18 16    Temp: (!) 97.5 F (36.4 C) 97.6 F (36.4 C) 97.9 F (36.6 C)   TempSrc: Oral Oral Oral   SpO2: 95% 94% 95% 95%  Weight:      Height:        Intake/Output Summary (Last 24 hours) at 03/17/2020 5038 Last data filed at 03/17/2020 8828 Gross per 24 hour  Intake --  Output 1450 ml  Net -1450 ml   Filed Weights   03/13/20 1043  Weight: 72.1 kg    Examination:   General: Not in pain or dyspnea, deconditioned  Neurology: Awake and alert, non focal  E ENT: no pallor, no icterus, oral mucosa moist Cardiovascular: No JVD. S1-S2 present, rhythmic, no gallops, rubs, or murmurs. +/++ bilateral non pitting lower extremity edema. Pulmonary: positive breath sounds bilaterally, deminished air movement, no wheezing, scattered rhonchi and rales.  Gastrointestinal. Abdomen with no organomegaly, non tender, no rebound or guarding Skin. No rashes Musculoskeletal: no joint deformities     Data Reviewed: I have personally reviewed following labs and imaging studies  CBC: Recent Labs  Lab 03/13/20 1044 03/14/20 0406 03/16/20 0523  WBC 20.4* 12.7* 5.2  NEUTROABS 17.3*  --  4.3  HGB 9.5* 8.4* 7.5*  HCT 33.4* 29.2* 25.7*  MCV 99.1 98.6 98.1  PLT 313 211 003   Basic Metabolic Panel: Recent Labs  Lab 03/13/20 1044 03/13/20 1500 03/14/20 0406 03/16/20 0523 03/17/20 0531  NA 137  --  140 135 133*  K 4.0  --  4.4 4.4 4.6  CL 99  --  105 103 102  CO2 28  --  24 24 22   GLUCOSE 140*  --  177* 129* 177*  BUN 44*  --  35* 44* 52*  CREATININE 0.99  --  0.84 1.00 1.01*  CALCIUM 9.6  --  9.6 9.9  9.6  MG  --  1.5*  --   --   --    GFR: Estimated Creatinine Clearance: 52.4 mL/min (A) (by C-G formula based on SCr of 1.01 mg/dL (H)). Liver Function Tests: Recent Labs  Lab 03/13/20 1044  AST 28  ALT 15  ALKPHOS 99  BILITOT 1.3*  PROT 7.8  ALBUMIN 2.7*   No results for input(s): LIPASE, AMYLASE in the last 168 hours. No results for input(s): AMMONIA in the last 168 hours. Coagulation Profile: Recent Labs  Lab 03/13/20 1044  INR 1.2   Cardiac Enzymes: No results for input(s): CKTOTAL, CKMB, CKMBINDEX, TROPONINI in the last 168 hours. BNP (last 3 results) No results for input(s): PROBNP in the last 8760 hours. HbA1C: No results for input(s): HGBA1C in the last 72 hours. CBG: No results for input(s): GLUCAP in the last 168 hours. Lipid Profile: No results for input(s): CHOL, HDL, LDLCALC, TRIG, CHOLHDL, LDLDIRECT in the last 72 hours. Thyroid Function Tests: No results for input(s): TSH, T4TOTAL, FREET4, T3FREE, THYROIDAB in the last 72 hours. Anemia Panel: No results for input(s): VITAMINB12, FOLATE, FERRITIN, TIBC, IRON, RETICCTPCT in the last 72 hours.    Radiology Studies: I have reviewed all of the imaging during this hospital visit personally     Scheduled Meds: . allopurinol  300 mg Oral Daily  . aspirin  81 mg Oral Daily  .  brimonidine  1 drop Left Eye BID  . cholecalciferol  1,000 Units Oral Daily  . cycloSPORINE  1 drop Both Eyes BID  . dorzolamide  1 drop Both Eyes Daily  . doxycycline  100 mg Oral Q12H  . enoxaparin (LOVENOX) injection  40 mg Subcutaneous Q24H  . feeding supplement (ENSURE ENLIVE)  237 mL Oral BID BM  . FLUoxetine  40 mg Oral Daily  . fluticasone furoate-vilanterol  1 puff Inhalation Daily  . gabapentin  200 mg Oral TID  . guaiFENesin  600 mg Oral BID  . ipratropium-albuterol  3 mL Nebulization BID  . latanoprost  1 drop Both Eyes QHS  . methylPREDNISolone (SOLU-MEDROL) injection  20 mg Intravenous Daily  . multivitamin with  minerals  1 tablet Oral Daily  . mycophenolate  180 mg Oral BID  . pantoprazole  40 mg Oral BID  . saccharomyces boulardii  250 mg Oral BID  . tacrolimus  0.5 mg Oral QHS  . tacrolimus  1 mg Oral Daily  . verapamil  240 mg Oral Daily  . vitamin B-12  100 mcg Oral Daily   Continuous Infusions:   LOS: 4 days        Berkley Wrightsman Gerome Apley, MD

## 2020-03-18 DIAGNOSIS — I10 Essential (primary) hypertension: Secondary | ICD-10-CM

## 2020-03-18 DIAGNOSIS — K219 Gastro-esophageal reflux disease without esophagitis: Secondary | ICD-10-CM

## 2020-03-18 LAB — CULTURE, BLOOD (ROUTINE X 2)
Culture: NO GROWTH
Culture: NO GROWTH
Special Requests: ADEQUATE

## 2020-03-18 MED ORDER — IPRATROPIUM-ALBUTEROL 0.5-2.5 (3) MG/3ML IN SOLN: 3.0000 mL | Freq: Two times a day (BID) | RESPIRATORY_TRACT | 0 refills | Status: DC

## 2020-03-18 MED ORDER — VERAPAMIL HCL ER 240 MG PO TBCR: 240.0000 mg | EXTENDED_RELEASE_TABLET | Freq: Every day | ORAL | 0 refills | Status: DC

## 2020-03-18 MED ORDER — CLONAZEPAM 0.25 MG PO TBDP: 0.2500 mg | ORAL_TABLET | Freq: Two times a day (BID) | ORAL | 0 refills | Status: DC | PRN

## 2020-03-18 MED ORDER — PREDNISONE 5 MG PO TABS: 7.0000 mg | ORAL_TABLET | Freq: Every day | ORAL | 0 refills | Status: DC

## 2020-03-18 MED ORDER — CLONAZEPAM 0.25 MG PO TBDP: 10.0000 mg | ORAL_TABLET | Freq: Two times a day (BID) | ORAL | 0 refills | Status: DC | PRN

## 2020-03-18 NOTE — Progress Notes (Signed)
Gave report to Aisha at [facility].

## 2020-03-18 NOTE — TOC Progression Note (Addendum)
Transition of Care [facility]) - Progression Note    Patient Details  Name: April [name] MRN: [id number] Date of Birth: 1956-04-10  [facility]) CM/SW [place], RN Phone Number: [phone] 03/18/2020, 9:57 AM  Clinical Narrative:    CM at bedside to discuss disposition with patient. Patient states that she would really like to go to [place], [place] or [place]. Information for referrals was already faxed to [facility] and [place]. Information faxed to [facility]. CM will reach out to the liaisons for these facilities.    03/18/2020 @1132  Patient has now decided that she will return to [facility]. Information has been faxed via the [place]. CM has contacted liaison from [facility]. Insurance authorization info has been faxed to [facility]. Approval pending.  Expected Discharge Plan: Skilled Nursing Facility Barriers to Discharge: Continued Medical Work up  Expected Discharge Plan and Services Expected Discharge Plan: [place] arrangements for the past 2 months: [place] Expected Discharge Date: 03/18/20                                     Social Determinants of Health (SDOH) Interventions    Readmission Risk Interventions Readmission Risk Prevention Plan 02/09/2020 04/20/2019  Transportation Screening Complete Complete  PCP or Specialist Appt within 3-5 Days - Not Complete  Not Complete comments - not yet ready for d/c  HRI or [place] - Not Complete  HRI or Home Care Consult comments - no needs identified  Social Work Consult for [place] Planning/Counseling - Not Complete  SW consult not completed comments - no needs identified  Palliative Care Screening - Not Applicable  Medication Review (RN Care Manager) Referral to Pharmacy Complete  PCP or Specialist appointment within 3-5 days of discharge Complete -  [place] or Home Care Consult Complete -  [place] Patient Refused -  Some recent data might be hidden

## 2020-03-18 NOTE — Discharge Summary (Addendum)
Physician Discharge Summary  JERITA WIMBUSH OZH:[id number] DOB: May 06, 1956 DOA: 03/13/2020  PCP: [name], MD  Admit date: 03/13/2020 Discharge date: 03/18/2020  Admitted From: SNF   Disposition: SNF   Recommendations for Outpatient Follow-up and new medication changes:  1. Follow up with Dr. Nicolasa Ducking in 7 days.  2. Verapamil has been increased to 240 mg daily 3. Added bid duoneb.  4. Follow cell count in 7 days.  5. Decreased clonazepam to 0.25 mg bid as needed for anxiety.   Home Health: na   Equipment/Devices: na    Discharge Condition: stable  CODE STATUS: full  Diet recommendation: heart healthy   Brief/Interim Summary: Patient was admitted to the hospital with a working diagnosis of acute on chronic hypoxic respiratory failure due to COPD exacerbation, complicated by SVT, she ruled out for pneumonia.  64 year old female who presented with dyspnea. 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 She presented with 4 days of dyspnea, complicated by fever and nonproductive cough, generalized weakness, nausea and headaches. Increased oxygen requirement from 2 L to 5 L/min per nasal cannula. Diagnosed with COPD exacerbation 4/16 in the emergency department. Failed outpatient treatment and returned to the hospital on 04/21. On her initial physical examination blood pressure 115/72, heart rate 117, respiratory rate 25, oxygen saturation 96% on supplemental oxygen, she had dry mucous membranes, her lungs had no significant rales, increased respiratory effort and positive accessory muscle use, heart S1-S2, present, rhythmic, tachycardic, abdomen soft, no lower extremity edema. Sodium 137, potassium 4.0, chloride 99, bicarb 28, glucose 140, BUN 44, creatinine 0.99,white count 20.4, hemoglobin 9.5, hematocrit 33.4, platelets  313.Chest radiograph with no infiltrates.  SARS COVID-19 negative Urine analysis with 10 to 20 WBC.  #1EKG 124 bpm, normal axis, normal intervals, sinus rhythm, no ST segment or T wave changes.  #2186 bpm, normal axis, narrow QRS, SVT/AVNRT,no ST segment or T wave changes.  #3 (post adenosine) 118,normal axis, normal intervals, sinus rhythm, no ST segment or T wave changes.  Patient responding well to respiratory treatment with bronchodilators, supplemental 02 and systemic steroids.   Tolerating well increased dose of verapamil, remained sinus rhythm with good rate control.   1.  Acute on chronic hypoxic respiratory failure due to COPD exacerbation.  Patient was initially admitted to the progressive care unit, she received supplemental oxygen per nasal cannula, aggressive bronchodilator therapy and airway clearing techniques with incentive spirometry and flutter valve.  She was treated with systemic steroids and oral doxycycline.  Follow-up chest radiograph negative for infiltrates.  Pneumonia was ruled out.  Patient was continued on inhaled corticosteroid and long-acting beta-2 agonist.  At the time of her discharge her oximetry is 95 to 96% on room air.  Will add DuoNeb to use twice daily.  2.  Acute SVT/AV nodal reentry tachycardia.  Patient received adenosine in the emergency department with improvement of heart rate, verapamil has been increased to 240 mg daily with good toleration.  3.  Polycystic renal disease status post renal transplant.  Patient was continued on immunosuppressive therapy, mycophenolate and tacrolimus.  Continue home dose of prednisone, her kidney function remained stable.  Positive urine culture for Klebsiella, urine analysis with 10 to 20 wbc, patient with no urinary symptoms, urine infection has been ruled out.    4.  Anxiety.  Continue clonazepam and fluoxetine.  5.  Chronic diastolic heart failure.  Echocardiogram from May  2020, had preserved LV systolic  function.  Continue verapamil and diuresis with furosemide.  Allowed for NT vein thrombosis per lower extremity ultrasonography.  6. Anemia of chronic disease. Iron panel from 03.21, with serum iron of 42, TIbc 197, transferrin saturation 21. Hgb has been between 7 and 8, will recommend to follow as outpatient. Clinically today no signs of bleeding.    Discharge Diagnoses:  Principal Problem:   Acute on chronic respiratory failure with hypoxia (HCC) Active Problems:   Bronchitis, mucopurulent recurrent (HCC)   Asthma, mild intermittent   GERD (gastroesophageal reflux disease)   Renal transplant recipient   Hyperlipidemia   Depression   Chronic diastolic CHF (congestive heart failure) (HCC)   Essential hypertension   Glaucoma   SVT (supraventricular tachycardia) (HCC)   Anemia of chronic disease    Discharge Instructions   Allergies as of 03/18/2020      Reactions   Infed [iron Dextran] Other (See Comments)   Chest tightness   Pentamidine Itching, Shortness Of Breath, Swelling   Erythromycin [erythromycin] Other (See Comments)   Mouth Ulcers   Iohexol Other (See Comments)   Per patient "she has had a kidney transplant and should never have contrast"   Oxycodone Nausea Only, Nausea And Vomiting   Erythromycin Rash   Causes breakout in mouth   Ultram [tramadol Hcl] Anxiety      Medication List    STOP taking these medications   lidocaine 5 % Commonly known as: LIDODERM     TAKE these medications   acetaminophen 325 MG tablet Commonly known as: TYLENOL Take 650 mg by mouth every 6 (six) hours as needed for mild pain or headache.   albuterol 108 (90 Base) MCG/ACT inhaler Commonly known as: VENTOLIN HFA Inhale 2 puffs into the lungs every 4 (four) hours as needed for wheezing or shortness of breath.   allopurinol 300 MG tablet Commonly known as: ZYLOPRIM Take 1 tablet (300 mg total) by mouth daily.   aspirin 81 MG chewable tablet Chew 81 mg by mouth daily.    brimonidine 0.1 % Soln Commonly known as: ALPHAGAN P Place 1 drop 2 (two) times daily into the left eye.   cholecalciferol 25 MCG (1000 UNIT) tablet Commonly known as: VITAMIN D3 Take 1 tablet (1,000 Units total) by mouth daily.   clonazePAM 0.25 MG disintegrating tablet Commonly known as: KLONOPIN Take 40 tablets (10 mg total) by mouth 2 (two) times daily as needed (Anxiety). What changed: how much to take   cycloSPORINE 0.05 % ophthalmic emulsion Commonly known as: RESTASIS Place 1 drop into both eyes 2 (two) times daily.   docusate sodium 100 MG capsule Commonly known as: COLACE Take 1 capsule (100 mg total) by mouth 2 (two) times daily. What changed:   when to take this  reasons to take this   dorzolamide 2 % ophthalmic solution Commonly known as: TRUSOPT Place 1 drop into both eyes daily.   feeding supplement (ENSURE ENLIVE) Liqd Take 237 mLs by mouth 2 (two) times daily between meals.   FLUoxetine 40 MG capsule Commonly known as: PROZAC Take 1 capsule (40 mg total) by mouth daily.   furosemide 40 MG tablet Commonly known as: LASIX Take 1 tablet (40 mg total) by mouth daily.   gabapentin 100 MG capsule Commonly known as: NEURONTIN Take 200 mg by mouth 3 (three) times daily.   guaiFENesin 600 MG 12 hr tablet Commonly known as: MUCINEX Take 1 tablet (600 mg total) by mouth 2 (two)  times daily.   ipratropium-albuterol 0.5-2.5 (3) MG/3ML Soln Commonly known as: DUONEB Take 3 mLs by nebulization 2 (two) times daily.   magnesium oxide 400 MG tablet Commonly known as: MAG-OX Take 1 tablet (400 mg total) by mouth 2 (two) times daily.   methocarbamol 500 MG tablet Commonly known as: ROBAXIN Take 1 tablet (500 mg total) by mouth 4 (four) times daily.   multivitamin with minerals Tabs tablet Take 1 tablet by mouth daily.   mycophenolate 180 MG EC tablet Commonly known as: MYFORTIC Take 180 mg by mouth 2 (two) times daily.   nystatin 100000 UNIT/ML  suspension Commonly known as: MYCOSTATIN Use as directed 5 mLs (500,000 Units total) in the mouth or throat 4 (four) times daily.   pantoprazole 40 MG tablet Commonly known as: PROTONIX Take 1 tablet (40 mg total) by mouth 2 (two) times daily.   polyethylene glycol 17 g packet Commonly known as: MIRALAX / GLYCOLAX Take 17 g by mouth daily as needed for mild constipation.   predniSONE 5 MG tablet Commonly known as: DELTASONE Take 1.5 tablets (7.5 mg total) by mouth daily with breakfast. What changed:   how much to take  when to take this  additional instructions   saccharomyces boulardii 250 MG capsule Commonly known as: FLORASTOR Take 1 capsule (250 mg total) by mouth 2 (two) times daily.   sodium chloride 0.65 % Soln nasal spray Commonly known as: OCEAN Place 1 spray into both nostrils as needed for congestion.   Symbicort 160-4.5 MCG/ACT inhaler Generic drug: budesonide-formoterol Inhale 2 puffs into the lungs 2 (two) times daily.   tacrolimus 0.5 MG capsule Commonly known as: PROGRAF Take 0.5 mg by mouth at bedtime. Takes brand name Prograf   Prograf 1 MG capsule Generic drug: tacrolimus Take 1 mg by mouth daily.   Travatan Z 0.004 % Soln ophthalmic solution Generic drug: Travoprost (BAK Free) Place 1 drop into both eyes at bedtime.   valACYclovir 500 MG tablet Commonly known as: VALTREX Take 500 mg by mouth 2 (two) times daily.   verapamil 240 MG CR tablet Commonly known as: CALAN-SR Take 1 tablet (240 mg total) by mouth daily. Start taking on: March 19, 2020 What changed:   medication strength  how much to take   vitamin B-12 100 MCG tablet Commonly known as: CYANOCOBALAMIN Take 100 mcg by mouth daily.       Allergies  Allergen Reactions  . Infed [Iron Dextran] Other (See Comments)    Chest tightness  . Pentamidine Itching, Shortness Of Breath and Swelling  . Erythromycin [Erythromycin] Other (See Comments)    Mouth Ulcers  . Iohexol  Other (See Comments)    Per patient "she has had a kidney transplant and should never have contrast"  . Oxycodone Nausea Only and Nausea And Vomiting  . Erythromycin Rash    Causes breakout in mouth  . Ultram [Tramadol Hcl] Anxiety        Procedures/Studies: Portable chest 1 View  Result Date: 03/14/2020 CLINICAL DATA:  Pneumonia. EXAM: PORTABLE CHEST 1 VIEW COMPARISON:  03/13/2020.  02/17/2020. FINDINGS: Persistent low lung volumes with bibasilar atelectasis. Similar findings noted on prior exams. No pleural effusion or pneumothorax. Heart size stable. No acute bony sclerotic that bony structures appear stable. IMPRESSION: Persistent low lung volumes with bibasilar atelectasis. Similar findings noted on prior exams. Electronically Signed   By: Marcello Moores  Register   On: 03/14/2020 06:38   DG Chest Port 1 View  Result Date: 03/13/2020 CLINICAL  DATA:  Shortness of breath, cough and hypoxia today. Weakness. EXAM: PORTABLE CHEST 1 VIEW COMPARISON:  03/08/2020 FINDINGS: Cardiac silhouette is top-normal in size. No mediastinal or hilar masses. Lung volumes are relatively low. Mild linear opacities noted at the bases consistent with atelectasis. Lungs otherwise clear. No convincing pleural effusion.  No pneumothorax. Skeletal structures are demineralized. There are rib fractures with associated callus formation on the right, lateral seventh and eighth ribs, which were present on the prior study. IMPRESSION: No acute cardiopulmonary disease. Electronically Signed   By: Lajean Manes M.D.   On: 03/13/2020 11:08   VAS [place] LOWER EXTREMITY VENOUS (DVT)  Result Date: 03/17/2020  Lower Venous DVTStudy Indications: Edema.  Limitations: Poor ultrasound/tissue interface. Comparison Study: 02/02/2020- negative lower extremity venous duplex Performing Technologist: Maudry Mayhew MHA, RDMS, RVT, RDCS  Examination Guidelines: A complete evaluation includes B-mode imaging, spectral Doppler, color Doppler, and power  Doppler as needed of all accessible portions of each vessel. Bilateral testing is considered an integral part of a complete examination. Limited examinations for reoccurring indications may be performed as noted. The reflux portion of the exam is performed with the patient in reverse Trendelenburg.  +---------+---------------+---------+-----------+----------+--------------+ RIGHT    CompressibilityPhasicitySpontaneityPropertiesThrombus Aging +---------+---------------+---------+-----------+----------+--------------+ CFV      Full           Yes      Yes                                 +---------+---------------+---------+-----------+----------+--------------+ SFJ      Full                                                        +---------+---------------+---------+-----------+----------+--------------+ FV Prox  Full                                                        +---------+---------------+---------+-----------+----------+--------------+ FV Mid   Full                                                        +---------+---------------+---------+-----------+----------+--------------+ FV DistalFull                                                        +---------+---------------+---------+-----------+----------+--------------+ PFV      Full                                                        +---------+---------------+---------+-----------+----------+--------------+ POP      Full           Yes      Yes                                 +---------+---------------+---------+-----------+----------+--------------+  PTV      Full                                                        +---------+---------------+---------+-----------+----------+--------------+ PERO     Full                                                        +---------+---------------+---------+-----------+----------+--------------+   Right Technical Findings: Not visualized segments  include limited visualization PTV/peroneal veins.  +---------+---------------+---------+-----------+----------+--------------+ LEFT     CompressibilityPhasicitySpontaneityPropertiesThrombus Aging +---------+---------------+---------+-----------+----------+--------------+ CFV      Full           Yes      Yes                                 +---------+---------------+---------+-----------+----------+--------------+ SFJ      Full                                                        +---------+---------------+---------+-----------+----------+--------------+ FV Prox  Full                                                        +---------+---------------+---------+-----------+----------+--------------+ FV Mid   Full                                                        +---------+---------------+---------+-----------+----------+--------------+ FV DistalFull                                                        +---------+---------------+---------+-----------+----------+--------------+ PFV      Full                                                        +---------+---------------+---------+-----------+----------+--------------+ POP      Full           Yes      Yes                                 +---------+---------------+---------+-----------+----------+--------------+ PTV      Full                                                        +---------+---------------+---------+-----------+----------+--------------+  PERO     Full                                                        +---------+---------------+---------+-----------+----------+--------------+   Left Technical Findings: Not visualized segments include limited visualization PTV/peroneal veins.   Summary: RIGHT: - There is no evidence of deep vein thrombosis in the lower extremity. However, portions of this examination were limited- see technologist comments above.  - No cystic structure  found in the popliteal fossa.  LEFT: - There is no evidence of deep vein thrombosis in the lower extremity. However, portions of this examination were limited- see technologist comments above.  - No cystic structure found in the popliteal fossa.  *See table(s) above for measurements and observations. Electronically signed by Harold Barban MD on 03/17/2020 at 11:42:00 AM.    Final        Subjective: Patient is feeling better, no nausea or vomiting, no chest pain, her dyspnea has improved and now back to baseline,   Discharge Exam: Vitals:   03/18/20 0744 03/18/20 0800  BP:  133/69  Pulse:  82  Resp:  (!) 24  Temp:    SpO2: 96% 95%   Vitals:   03/18/20 0447 03/18/20 0726 03/18/20 0744 03/18/20 0800  BP: (!) 116/55 101/90  133/69  Pulse: 72 81  82  Resp: 19 (!) 26  (!) 24  Temp: 97.6 F (36.4 C) 98 F (36.7 C)    TempSrc: Oral Oral    SpO2: 96% 94% 96% 95%  Weight:      Height:        General: Not in pain or dyspnea,  Neurology: Awake and alert, non focal  E ENT: no pallor, no icterus, oral mucosa moist Cardiovascular: No JVD. S1-S2 present, rhythmic, no gallops, rubs, or murmurs. Trace pitting lower extremity edema. Pulmonary: positive breath sounds bilaterally, decreased air movement, no wheezing, no rhonchi, scattered rales at bases. Gastrointestinal. Abdomen with no organomegaly, non tender, no rebound or guarding Skin. No rashes Musculoskeletal: no joint deformities   The results of significant diagnostics from this hospitalization (including imaging, microbiology, ancillary and laboratory) are listed below for reference.     Microbiology: Recent Results (from the past 240 hour(s))  Respiratory Panel by RT PCR (Flu A&B, Covid) - Nasopharyngeal Swab     Status: None   Collection Time: 03/13/20 10:44 AM   Specimen: Nasopharyngeal Swab  Result Value Ref Range Status   SARS Coronavirus 2 by RT PCR NEGATIVE NEGATIVE Final    Comment: (NOTE) SARS-CoV-2 target nucleic  acids are NOT DETECTED. The SARS-CoV-2 RNA is generally detectable in upper respiratoy specimens during the acute phase of infection. The lowest concentration of SARS-CoV-2 viral copies this assay can detect is 131 copies/mL. A negative result does not preclude SARS-Cov-2 infection and should not be used as the sole basis for treatment or other patient management decisions. A negative result may occur with  improper specimen collection/handling, submission of specimen other than nasopharyngeal swab, presence of viral mutation(s) within the areas targeted by this assay, and inadequate number of viral copies (<131 copies/mL). A negative result must be combined with clinical observations, patient history, and epidemiological information. The expected result is Negative. Fact Sheet for Patients:  PinkCheek.be Fact Sheet for Healthcare Providers:  GravelBags.it This test is not yet ap  proved or cleared by the [place] and  has been authorized for detection and/or diagnosis of SARS-CoV-2 by FDA under an Emergency Use Authorization (EUA). This EUA will remain  in effect (meaning this test can be used) for the duration of the COVID-19 declaration under Section 564(b)(1) of the Act, 21 U.S.C. section 360bbb-3(b)(1), unless the authorization is terminated or revoked sooner.    Influenza A by PCR NEGATIVE NEGATIVE Final   Influenza B by PCR NEGATIVE NEGATIVE Final    Comment: (NOTE) The Xpert Xpress SARS-CoV-2/FLU/RSV assay is intended as an aid in  the diagnosis of influenza from Nasopharyngeal swab specimens and  should not be used as a sole basis for treatment. Nasal washings and  aspirates are unacceptable for Xpert Xpress SARS-CoV-2/FLU/RSV  testing. Fact Sheet for Patients: PinkCheek.be Fact Sheet for Healthcare Providers: GravelBags.it This test is not yet  approved or cleared by the [place] FDA and  has been authorized for detection and/or diagnosis of SARS-CoV-2 by  FDA under an Emergency Use Authorization (EUA). This EUA will remain  in effect (meaning this test can be used) for the duration of the  Covid-19 declaration under Section 564(b)(1) of the Act, 21  U.S.C. section 360bbb-3(b)(1), unless the authorization is  terminated or revoked. Performed at [facility], [place] [address], [place], [place] [address]   Blood Culture (routine x 2)     Status: None (Preliminary result)   Collection Time: 03/13/20 10:45 AM   Specimen: BLOOD  Result Value Ref Range Status   Specimen Description BLOOD RIGHT ANTECUBITAL  Final   Special Requests   Final    BOTTLES DRAWN AEROBIC AND ANAEROBIC Blood Culture adequate volume   Culture   Final    NO GROWTH 4 DAYS Performed at [facility], [place] [address] [place], [place] [address]    Report Status PENDING  Incomplete  Blood Culture (routine x 2)     Status: None (Preliminary result)   Collection Time: 03/13/20 10:48 AM   Specimen: BLOOD RIGHT FOREARM  Result Value Ref Range Status   Specimen Description BLOOD RIGHT FOREARM  Final   Special Requests   Final    BOTTLES DRAWN AEROBIC ONLY Blood Culture results may not be optimal due to an inadequate volume of blood received in culture bottles   Culture   Final    NO GROWTH 4 DAYS Performed at [facility], [place] [address], [place], [place] [address]    Report Status PENDING  Incomplete  Urine culture     Status: Abnormal   Collection Time: 03/13/20 11:38 AM   Specimen: In/Out Cath Urine  Result Value Ref Range Status   Specimen Description IN/OUT CATH URINE  Final   Special Requests   Final    NONE Performed at [facility], [place] [address] [place], [place] [address]    Culture >=100,000 COLONIES/mL KLEBSIELLA PNEUMONIAE (A)  Final   Report Status 03/15/2020 FINAL  Final   Organism ID, Bacteria KLEBSIELLA  PNEUMONIAE (A)  Final      Susceptibility   Klebsiella pneumoniae - MIC*    AMPICILLIN >=32 RESISTANT Resistant     CEFAZOLIN <=4 SENSITIVE Sensitive     CEFTRIAXONE <=1 SENSITIVE Sensitive     CIPROFLOXACIN <=0.25 SENSITIVE Sensitive     GENTAMICIN <=1 SENSITIVE Sensitive     IMIPENEM <=0.25 SENSITIVE Sensitive     NITROFURANTOIN 128 RESISTANT Resistant     TRIMETH/SULFA <=20 SENSITIVE Sensitive  AMPICILLIN/SULBACTAM 16 INTERMEDIATE Intermediate     PIP/TAZO 16 SENSITIVE Sensitive     * >=100,000 COLONIES/mL KLEBSIELLA PNEUMONIAE  MRSA PCR Screening     Status: None   Collection Time: 03/13/20  4:56 PM   Specimen: Nasal Mucosa; Nasopharyngeal  Result Value Ref Range Status   MRSA by PCR NEGATIVE NEGATIVE Final    Comment:        The GeneXpert MRSA Assay (FDA approved for NASAL specimens only), is one component of a comprehensive MRSA colonization surveillance program. It is not intended to diagnose MRSA infection nor to guide or monitor treatment for MRSA infections. Performed at [facility], [place] [address], [place], [place] [address]   Respiratory Panel by RT PCR (Flu A&B, Covid) - Nasopharyngeal Swab     Status: None   Collection Time: 03/17/20  5:32 PM   Specimen: Nasopharyngeal Swab  Result Value Ref Range Status   SARS Coronavirus 2 by RT PCR NEGATIVE NEGATIVE Final    Comment: (NOTE) SARS-CoV-2 target nucleic acids are NOT DETECTED. The SARS-CoV-2 RNA is generally detectable in upper respiratoy specimens during the acute phase of infection. The lowest concentration of SARS-CoV-2 viral copies this assay can detect is 131 copies/mL. A negative result does not preclude SARS-Cov-2 infection and should not be used as the sole basis for treatment or other patient management decisions. A negative result may occur with  improper specimen collection/handling, submission of specimen other than nasopharyngeal swab, presence of viral mutation(s) within the areas  targeted by this assay, and inadequate number of viral copies (<131 copies/mL). A negative result must be combined with clinical observations, patient history, and epidemiological information. The expected result is Negative. Fact Sheet for Patients:  PinkCheek.be Fact Sheet for Healthcare Providers:  GravelBags.it This test is not yet ap proved or cleared by the [place] FDA and  has been authorized for detection and/or diagnosis of SARS-CoV-2 by FDA under an Emergency Use Authorization (EUA). This EUA will remain  in effect (meaning this test can be used) for the duration of the COVID-19 declaration under Section 564(b)(1) of the Act, 21 U.S.C. section 360bbb-3(b)(1), unless the authorization is terminated or revoked sooner.    Influenza A by PCR NEGATIVE NEGATIVE Final   Influenza B by PCR NEGATIVE NEGATIVE Final    Comment: (NOTE) The Xpert Xpress SARS-CoV-2/FLU/RSV assay is intended as an aid in  the diagnosis of influenza from Nasopharyngeal swab specimens and  should not be used as a sole basis for treatment. Nasal washings and  aspirates are unacceptable for Xpert Xpress SARS-CoV-2/FLU/RSV  testing. Fact Sheet for Patients: PinkCheek.be Fact Sheet for Healthcare Providers: GravelBags.it This test is not yet approved or cleared by the [place] FDA and  has been authorized for detection and/or diagnosis of SARS-CoV-2 by  FDA under an Emergency Use Authorization (EUA). This EUA will remain  in effect (meaning this test can be used) for the duration of the  Covid-19 declaration under Section 564(b)(1) of the Act, 21  U.S.C. section 360bbb-3(b)(1), unless the authorization is  terminated or revoked. Performed at [facility], [place] [address] [place], [place] [address]      Labs: BNP (last 3 results) Recent Labs    01/31/20 2007 02/08/20 0436  03/13/20 1044  BNP 574.8* 459.3* 740.8*   Basic Metabolic Panel: Recent Labs  Lab 03/13/20 1044 03/13/20 1500 03/14/20 0406 03/16/20 0523 03/17/20 0531  NA 137  --  140 135 133*  K 4.0  --  4.4 4.4 4.6  CL 99  --  105 103 102  CO2 28  --  24 24 22   GLUCOSE 140*  --  177* 129* 177*  BUN 44*  --  35* 44* 52*  CREATININE 0.99  --  0.84 1.00 1.01*  CALCIUM 9.6  --  9.6 9.9 9.6  MG  --  1.5*  --   --   --    Liver Function Tests: Recent Labs  Lab 03/13/20 1044  AST 28  ALT 15  ALKPHOS 99  BILITOT 1.3*  PROT 7.8  ALBUMIN 2.7*   No results for input(s): LIPASE, AMYLASE in the last 168 hours. No results for input(s): AMMONIA in the last 168 hours. CBC: Recent Labs  Lab 03/13/20 1044 03/14/20 0406 03/16/20 0523  WBC 20.4* 12.7* 5.2  NEUTROABS 17.3*  --  4.3  HGB 9.5* 8.4* 7.5*  HCT 33.4* 29.2* 25.7*  MCV 99.1 98.6 98.1  PLT 313 211 218   Cardiac Enzymes: No results for input(s): CKTOTAL, CKMB, CKMBINDEX, TROPONINI in the last 168 hours. BNP: Invalid input(s): POCBNP CBG: No results for input(s): GLUCAP in the last 168 hours. D-Dimer No results for input(s): DDIMER in the last 72 hours. Hgb A1c No results for input(s): HGBA1C in the last 72 hours. Lipid Profile No results for input(s): CHOL, HDL, LDLCALC, TRIG, CHOLHDL, LDLDIRECT in the last 72 hours. Thyroid function studies No results for input(s): TSH, T4TOTAL, T3FREE, THYROIDAB in the last 72 hours.  Invalid input(s): FREET3 Anemia work up No results for input(s): VITAMINB12, FOLATE, FERRITIN, TIBC, IRON, RETICCTPCT in the last 72 hours. Urinalysis    Component Value Date/Time   COLORURINE YELLOW 03/13/2020 1138   APPEARANCEUR CLEAR 03/13/2020 1138   LABSPEC 1.012 03/13/2020 1138   PHURINE 5.0 03/13/2020 1138   GLUCOSEU NEGATIVE 03/13/2020 1138   HGBUR SMALL (A) 03/13/2020 1138   BILIRUBINUR NEGATIVE 03/13/2020 1138   KETONESUR NEGATIVE 03/13/2020 1138   PROTEINUR 100 (A) 03/13/2020 1138    UROBILINOGEN 0.2 05/15/2013 0733   NITRITE NEGATIVE 03/13/2020 1138   LEUKOCYTESUR SMALL (A) 03/13/2020 1138   Sepsis Labs Invalid input(s): PROCALCITONIN,  WBC,  LACTICIDVEN Microbiology Recent Results (from the past 240 hour(s))  Respiratory Panel by RT PCR (Flu A&B, Covid) - Nasopharyngeal Swab     Status: None   Collection Time: 03/13/20 10:44 AM   Specimen: Nasopharyngeal Swab  Result Value Ref Range Status   SARS Coronavirus 2 by RT PCR NEGATIVE NEGATIVE Final    Comment: (NOTE) SARS-CoV-2 target nucleic acids are NOT DETECTED. The SARS-CoV-2 RNA is generally detectable in upper respiratoy specimens during the acute phase of infection. The lowest concentration of SARS-CoV-2 viral copies this assay can detect is 131 copies/mL. A negative result does not preclude SARS-Cov-2 infection and should not be used as the sole basis for treatment or other patient management decisions. A negative result may occur with  improper specimen collection/handling, submission of specimen other than nasopharyngeal swab, presence of viral mutation(s) within the areas targeted by this assay, and inadequate number of viral copies (<131 copies/mL). A negative result must be combined with clinical observations, patient history, and epidemiological information. The expected result is Negative. Fact Sheet for Patients:  PinkCheek.be Fact Sheet for Healthcare Providers:  GravelBags.it This test is not yet ap proved or cleared by the [place] FDA and  has been authorized for detection and/or diagnosis of SARS-CoV-2 by FDA under an Emergency Use Authorization (EUA). This EUA will remain  in effect (  meaning this test can be used) for the duration of the COVID-19 declaration under Section 564(b)(1) of the Act, 21 U.S.C. section 360bbb-3(b)(1), unless the authorization is terminated or revoked sooner.    Influenza A by PCR NEGATIVE NEGATIVE  Final   Influenza B by PCR NEGATIVE NEGATIVE Final    Comment: (NOTE) The Xpert Xpress SARS-CoV-2/FLU/RSV assay is intended as an aid in  the diagnosis of influenza from Nasopharyngeal swab specimens and  should not be used as a sole basis for treatment. Nasal washings and  aspirates are unacceptable for Xpert Xpress SARS-CoV-2/FLU/RSV  testing. Fact Sheet for Patients: PinkCheek.be Fact Sheet for Healthcare Providers: GravelBags.it This test is not yet approved or cleared by the [place] FDA and  has been authorized for detection and/or diagnosis of SARS-CoV-2 by  FDA under an Emergency Use Authorization (EUA). This EUA will remain  in effect (meaning this test can be used) for the duration of the  Covid-19 declaration under Section 564(b)(1) of the Act, 21  U.S.C. section 360bbb-3(b)(1), unless the authorization is  terminated or revoked. Performed at [facility], [place] [address] [place], [place] [address]   Blood Culture (routine x 2)     Status: None (Preliminary result)   Collection Time: 03/13/20 10:45 AM   Specimen: BLOOD  Result Value Ref Range Status   Specimen Description BLOOD RIGHT ANTECUBITAL  Final   Special Requests   Final    BOTTLES DRAWN AEROBIC AND ANAEROBIC Blood Culture adequate volume   Culture   Final    NO GROWTH 4 DAYS Performed at [facility], [place] [address], [place], [place] [address]    Report Status PENDING  Incomplete  Blood Culture (routine x 2)     Status: None (Preliminary result)   Collection Time: 03/13/20 10:48 AM   Specimen: BLOOD RIGHT FOREARM  Result Value Ref Range Status   Specimen Description BLOOD RIGHT FOREARM  Final   Special Requests   Final    BOTTLES DRAWN AEROBIC ONLY Blood Culture results may not be optimal due to an inadequate volume of blood received in culture bottles   Culture   Final    NO GROWTH 4 DAYS Performed at [facility], [place] [address], [place], [place] [address]    Report Status PENDING  Incomplete  Urine culture     Status: Abnormal   Collection Time: 03/13/20 11:38 AM   Specimen: In/Out Cath Urine  Result Value Ref Range Status   Specimen Description IN/OUT CATH URINE  Final   Special Requests   Final    NONE Performed at [facility], [place] [address] [place], [place] [address]    Culture >=100,000 COLONIES/mL KLEBSIELLA PNEUMONIAE (A)  Final   Report Status 03/15/2020 FINAL  Final   Organism ID, Bacteria KLEBSIELLA PNEUMONIAE (A)  Final      Susceptibility   Klebsiella pneumoniae - MIC*    AMPICILLIN >=32 RESISTANT Resistant     CEFAZOLIN <=4 SENSITIVE Sensitive     CEFTRIAXONE <=1 SENSITIVE Sensitive     CIPROFLOXACIN <=0.25 SENSITIVE Sensitive     GENTAMICIN <=1 SENSITIVE Sensitive     IMIPENEM <=0.25 SENSITIVE Sensitive     NITROFURANTOIN 128 RESISTANT Resistant     TRIMETH/SULFA <=20 SENSITIVE Sensitive     AMPICILLIN/SULBACTAM 16 INTERMEDIATE Intermediate     PIP/TAZO 16 SENSITIVE Sensitive     * >=100,000 COLONIES/mL KLEBSIELLA PNEUMONIAE  MRSA PCR Screening     Status:  None   Collection Time: 03/13/20  4:56 PM   Specimen: Nasal Mucosa; Nasopharyngeal  Result Value Ref Range Status   MRSA by PCR NEGATIVE NEGATIVE Final    Comment:        The GeneXpert MRSA Assay (FDA approved for NASAL specimens only), is one component of a comprehensive MRSA colonization surveillance program. It is not intended to diagnose MRSA infection nor to guide or monitor treatment for MRSA infections. Performed at [facility], [place] [address], [place], [place] [address]   Respiratory Panel by RT PCR (Flu A&B, Covid) - Nasopharyngeal Swab     Status: None   Collection Time: 03/17/20  5:32 PM   Specimen: Nasopharyngeal Swab  Result Value Ref Range Status   SARS Coronavirus 2 by RT PCR NEGATIVE NEGATIVE Final    Comment: (NOTE) SARS-CoV-2 target nucleic acids are NOT DETECTED. The SARS-CoV-2 RNA is  generally detectable in upper respiratoy specimens during the acute phase of infection. The lowest concentration of SARS-CoV-2 viral copies this assay can detect is 131 copies/mL. A negative result does not preclude SARS-Cov-2 infection and should not be used as the sole basis for treatment or other patient management decisions. A negative result may occur with  improper specimen collection/handling, submission of specimen other than nasopharyngeal swab, presence of viral mutation(s) within the areas targeted by this assay, and inadequate number of viral copies (<131 copies/mL). A negative result must be combined with clinical observations, patient history, and epidemiological information. The expected result is Negative. Fact Sheet for Patients:  PinkCheek.be Fact Sheet for Healthcare Providers:  GravelBags.it This test is not yet ap proved or cleared by the [place] FDA and  has been authorized for detection and/or diagnosis of SARS-CoV-2 by FDA under an Emergency Use Authorization (EUA). This EUA will remain  in effect (meaning this test can be used) for the duration of the COVID-19 declaration under Section 564(b)(1) of the Act, 21 U.S.C. section 360bbb-3(b)(1), unless the authorization is terminated or revoked sooner.    Influenza A by PCR NEGATIVE NEGATIVE Final   Influenza B by PCR NEGATIVE NEGATIVE Final    Comment: (NOTE) The Xpert Xpress SARS-CoV-2/FLU/RSV assay is intended as an aid in  the diagnosis of influenza from Nasopharyngeal swab specimens and  should not be used as a sole basis for treatment. Nasal washings and  aspirates are unacceptable for Xpert Xpress SARS-CoV-2/FLU/RSV  testing. Fact Sheet for Patients: PinkCheek.be Fact Sheet for Healthcare Providers: GravelBags.it This test is not yet approved or cleared by the [place] FDA and   has been authorized for detection and/or diagnosis of SARS-CoV-2 by  FDA under an Emergency Use Authorization (EUA). This EUA will remain  in effect (meaning this test can be used) for the duration of the  Covid-19 declaration under Section 564(b)(1) of the Act, 21  U.S.C. section 360bbb-3(b)(1), unless the authorization is  terminated or revoked. Performed at [facility], [place] [address], [place], [place] [address]      Time coordinating discharge: 45 minutes  SIGNED:   Tawni Millers, MD  [facility] 03/18/2020, 8:41 AM

## 2020-03-18 NOTE — TOC Transition Note (Signed)
Transition of Care [facility]) - CM/SW Discharge Note   Patient Details  Name: April [name] MRN: [id number] Date of Birth: Dec 06, 1955  Transition of Care [facility]) CM/SW Contact:  [name], RN Phone Number: 03/18/2020, 3:00 PM   Clinical Narrative:    [facility]  verified that insurance authorization has been approved. [name] made aware. Patient/daughter made aware. Daughter is at bedside. CM attempted to call husband with no answer. Bedside nurse has been updated. [facility] has been called for discharge transportation. Discharge packet has been placed on the chart and bedside nurse is aware. No further needs at this time. CM will sign off.   Please call report to- [name] [phone] Room# [phone]    Final next level of care: Skilled Nursing Facility([name]) Barriers to Discharge: No Barriers Identified   Patient Goals and CMS Choice Patient states their goals for this hospitalization and ongoing recovery are:: To get better and go home CMS Medicare.gov Compare Post Acute Care list provided to:: Patient Choice offered to / list presented to : Patient  Discharge Placement   Existing PASRR number confirmed : 03/18/20          [facility] at[name] Patient to be transferred to facility by: [name] Name of family member notified: [name] no answer Daughter [name] at the bedside and made aware Patient and family notified of of transfer: 03/18/20  Discharge Plan and Services                DME Arranged: N/A DME Agency: NA       HH Arranged: NA HH Agency: NA        Social Determinants of Health (SDOH) Interventions     Readmission Risk Interventions Readmission Risk Prevention Plan 02/09/2020 04/20/2019  Transportation Screening Complete Complete  PCP or Specialist Appt within 3-5 Days - Not Complete  Not Complete comments - not yet ready for d/c  HRI or [place] - Not Complete  HRI or Home Care Consult comments - no  needs identified  Social Work Consult for [place] Planning/Counseling - Not Complete  SW consult not completed comments - no needs identified  Palliative Care Screening - Not Applicable  Medication Review [employer]) Referral to Pharmacy Complete  PCP or Specialist appointment within 3-5 days of discharge Complete -  [place] or Home Care Consult Complete -  [place] Patient Refused -  Some recent data might be hidden

## 2020-04-07 ENCOUNTER — Encounter (HOSPITAL_COMMUNITY): Payer: Self-pay

## 2020-04-07 ENCOUNTER — Observation Stay (HOSPITAL_COMMUNITY)
Admission: EM | Admit: 2020-04-07 | Discharge: 2020-04-11 | Disposition: A | Discharge status: Skilled Nursing Facility | Payer: Medicare Other | Attending: Internal Medicine | Admitting: Internal Medicine

## 2020-04-07 ENCOUNTER — Other Ambulatory Visit: Payer: Self-pay

## 2020-04-07 ENCOUNTER — Emergency Department (HOSPITAL_COMMUNITY): Payer: Medicare Other

## 2020-04-07 DIAGNOSIS — L89309 Pressure ulcer of unspecified buttock, unspecified stage: Secondary | ICD-10-CM | POA: Insufficient documentation

## 2020-04-07 DIAGNOSIS — R739 Hyperglycemia, unspecified: Secondary | ICD-10-CM | POA: Insufficient documentation

## 2020-04-07 DIAGNOSIS — I5032 Chronic diastolic (congestive) heart failure: Secondary | ICD-10-CM | POA: Diagnosis present

## 2020-04-07 DIAGNOSIS — Z992 Dependence on renal dialysis: Secondary | ICD-10-CM | POA: Diagnosis not present

## 2020-04-07 DIAGNOSIS — K5909 Other constipation: Secondary | ICD-10-CM | POA: Diagnosis not present

## 2020-04-07 DIAGNOSIS — M1990 Unspecified osteoarthritis, unspecified site: Secondary | ICD-10-CM | POA: Diagnosis not present

## 2020-04-07 DIAGNOSIS — Z7982 Long term (current) use of aspirin: Secondary | ICD-10-CM | POA: Diagnosis not present

## 2020-04-07 DIAGNOSIS — Z9981 Dependence on supplemental oxygen: Secondary | ICD-10-CM | POA: Insufficient documentation

## 2020-04-07 DIAGNOSIS — Z940 Kidney transplant status: Secondary | ICD-10-CM

## 2020-04-07 DIAGNOSIS — F419 Anxiety disorder, unspecified: Secondary | ICD-10-CM | POA: Diagnosis present

## 2020-04-07 DIAGNOSIS — F411 Generalized anxiety disorder: Secondary | ICD-10-CM

## 2020-04-07 DIAGNOSIS — Z6830 Body mass index (BMI) 30.0-30.9, adult: Secondary | ICD-10-CM | POA: Diagnosis not present

## 2020-04-07 DIAGNOSIS — L89322 Pressure ulcer of left buttock, stage 2: Secondary | ICD-10-CM | POA: Insufficient documentation

## 2020-04-07 DIAGNOSIS — J441 Chronic obstructive pulmonary disease with (acute) exacerbation: Principal | ICD-10-CM | POA: Insufficient documentation

## 2020-04-07 DIAGNOSIS — I10 Essential (primary) hypertension: Secondary | ICD-10-CM | POA: Diagnosis present

## 2020-04-07 DIAGNOSIS — Z881 Allergy status to other antibiotic agents status: Secondary | ICD-10-CM | POA: Diagnosis not present

## 2020-04-07 DIAGNOSIS — D638 Anemia in other chronic diseases classified elsewhere: Secondary | ICD-10-CM | POA: Diagnosis not present

## 2020-04-07 DIAGNOSIS — D631 Anemia in chronic kidney disease: Secondary | ICD-10-CM | POA: Diagnosis not present

## 2020-04-07 DIAGNOSIS — Z7952 Long term (current) use of systemic steroids: Secondary | ICD-10-CM | POA: Diagnosis not present

## 2020-04-07 DIAGNOSIS — J9621 Acute and chronic respiratory failure with hypoxia: Secondary | ICD-10-CM | POA: Diagnosis not present

## 2020-04-07 DIAGNOSIS — Z7984 Long term (current) use of oral hypoglycemic drugs: Secondary | ICD-10-CM | POA: Diagnosis not present

## 2020-04-07 DIAGNOSIS — F329 Major depressive disorder, single episode, unspecified: Secondary | ICD-10-CM | POA: Insufficient documentation

## 2020-04-07 DIAGNOSIS — I132 Hypertensive heart and chronic kidney disease with heart failure and with stage 5 chronic kidney disease, or end stage renal disease: Secondary | ICD-10-CM | POA: Diagnosis not present

## 2020-04-07 DIAGNOSIS — Z79899 Other long term (current) drug therapy: Secondary | ICD-10-CM | POA: Insufficient documentation

## 2020-04-07 DIAGNOSIS — Z885 Allergy status to narcotic agent status: Secondary | ICD-10-CM | POA: Diagnosis not present

## 2020-04-07 DIAGNOSIS — Q613 Polycystic kidney, unspecified: Secondary | ICD-10-CM | POA: Diagnosis not present

## 2020-04-07 DIAGNOSIS — N186 End stage renal disease: Secondary | ICD-10-CM | POA: Diagnosis not present

## 2020-04-07 DIAGNOSIS — K219 Gastro-esophageal reflux disease without esophagitis: Secondary | ICD-10-CM | POA: Insufficient documentation

## 2020-04-07 DIAGNOSIS — Z7951 Long term (current) use of inhaled steroids: Secondary | ICD-10-CM | POA: Diagnosis not present

## 2020-04-07 DIAGNOSIS — I471 Supraventricular tachycardia: Secondary | ICD-10-CM | POA: Insufficient documentation

## 2020-04-07 DIAGNOSIS — Z87891 Personal history of nicotine dependence: Secondary | ICD-10-CM | POA: Insufficient documentation

## 2020-04-07 DIAGNOSIS — Z90722 Acquired absence of ovaries, bilateral: Secondary | ICD-10-CM | POA: Insufficient documentation

## 2020-04-07 DIAGNOSIS — E669 Obesity, unspecified: Secondary | ICD-10-CM | POA: Insufficient documentation

## 2020-04-07 DIAGNOSIS — J449 Chronic obstructive pulmonary disease, unspecified: Secondary | ICD-10-CM | POA: Diagnosis present

## 2020-04-07 DIAGNOSIS — Z20822 Contact with and (suspected) exposure to covid-19: Secondary | ICD-10-CM | POA: Diagnosis not present

## 2020-04-07 DIAGNOSIS — R0603 Acute respiratory distress: Secondary | ICD-10-CM

## 2020-04-07 DIAGNOSIS — R079 Chest pain, unspecified: Secondary | ICD-10-CM | POA: Diagnosis present

## 2020-04-07 DIAGNOSIS — Z888 Allergy status to other drugs, medicaments and biological substances status: Secondary | ICD-10-CM | POA: Insufficient documentation

## 2020-04-07 DIAGNOSIS — Z8249 Family history of ischemic heart disease and other diseases of the circulatory system: Secondary | ICD-10-CM | POA: Insufficient documentation

## 2020-04-07 LAB — CBC
HCT: 24.6 % — ABNORMAL LOW (ref 36.0–46.0)
Hemoglobin: 7.1 g/dL — ABNORMAL LOW (ref 12.0–15.0)
MCH: 28.6 pg (ref 26.0–34.0)
MCHC: 28.9 g/dL — ABNORMAL LOW (ref 30.0–36.0)
MCV: 99.2 fL (ref 80.0–100.0)
Platelets: 285 10*3/uL (ref 150–400)
RBC: 2.48 MIL/uL — ABNORMAL LOW (ref 3.87–5.11)
RDW: 24.1 % — ABNORMAL HIGH (ref 11.5–15.5)
WBC: 9.6 10*3/uL (ref 4.0–10.5)
nRBC: 0 % (ref 0.0–0.2)

## 2020-04-07 LAB — BASIC METABOLIC PANEL
Anion gap: 10 (ref 5–15)
BUN: 21 mg/dL (ref 8–23)
CO2: 27 mmol/L (ref 22–32)
Calcium: 9.4 mg/dL (ref 8.9–10.3)
Chloride: 98 mmol/L (ref 98–111)
Creatinine, Ser: 0.72 mg/dL (ref 0.44–1.00)
GFR calc Af Amer: >60 mL/min (ref >60)
GFR calc non Af Amer: >60 mL/min (ref >60)
Glucose, Bld: 90 mg/dL (ref 70–99)
Potassium: 3.9 mmol/L (ref 3.5–5.1)
Sodium: 135 mmol/L (ref 135–145)

## 2020-04-07 LAB — URINALYSIS, ROUTINE W REFLEX MICROSCOPIC
Bacteria, UA: NONE SEEN
Bilirubin Urine: NEGATIVE
Glucose, UA: NEGATIVE mg/dL
Ketones, ur: NEGATIVE mg/dL
Leukocytes,Ua: NEGATIVE
Nitrite: NEGATIVE
Protein, ur: 30 mg/dL — AB
Specific Gravity, Urine: 1.013 (ref 1.005–1.030)
pH: 5 (ref 5.0–8.0)

## 2020-04-07 LAB — POCT I-STAT 7, (LYTES, BLD GAS, ICA,H+H)
Acid-Base Excess: 7 mmol/L — ABNORMAL HIGH (ref 0.0–2.0)
Bicarbonate: 31.4 mmol/L — ABNORMAL HIGH (ref 20.0–28.0)
Calcium, Ion: 1.33 mmol/L (ref 1.15–1.40)
HCT: 28 % — ABNORMAL LOW (ref 36.0–46.0)
Hemoglobin: 9.5 g/dL — ABNORMAL LOW (ref 12.0–15.0)
O2 Saturation: 100 %
Patient temperature: 98.8
Potassium: 3.9 mmol/L (ref 3.5–5.1)
Sodium: 135 mmol/L (ref 135–145)
TCO2: 33 mmol/L — ABNORMAL HIGH (ref 22–32)
pCO2 arterial: 41.6 mmHg (ref 32.0–48.0)
pH, Arterial: 7.486 — ABNORMAL HIGH (ref 7.350–7.450)
pO2, Arterial: 214 mmHg — ABNORMAL HIGH (ref 83.0–108.0)

## 2020-04-07 LAB — GLUCOSE, CAPILLARY
Glucose-Capillary: 384 mg/dL — ABNORMAL HIGH (ref 70–99)
Glucose-Capillary: 326 mg/dL — ABNORMAL HIGH (ref 70–99)

## 2020-04-07 LAB — TROPONIN I (HIGH SENSITIVITY)
Troponin I (High Sensitivity): 12 ng/L (ref <18)
Troponin I (High Sensitivity): 11 ng/L (ref <18)

## 2020-04-07 LAB — BRAIN NATRIURETIC PEPTIDE: B Natriuretic Peptide: 494.5 pg/mL — ABNORMAL HIGH (ref 0.0–100.0)

## 2020-04-07 LAB — TYPE AND SCREEN
ABO/RH(D): A NEG
Antibody Screen: NEGATIVE

## 2020-04-07 LAB — CBG MONITORING, ED: Glucose-Capillary: 221 mg/dL — ABNORMAL HIGH (ref 70–99)

## 2020-04-07 LAB — POC OCCULT BLOOD, ED: Fecal Occult Bld: NEGATIVE

## 2020-04-07 LAB — SARS CORONAVIRUS 2 BY RT PCR (HOSPITAL ORDER, PERFORMED IN ~~LOC~~ HOSPITAL LAB): SARS Coronavirus 2: NEGATIVE

## 2020-04-07 IMAGING — DX DG CHEST 1V PORT
1 series · 1 of 1 positions shown · non-contrast
Comparison: Chest x-rays dated 08/30/2018 and 07/15/2016.

CLINICAL DATA: Shortness of breath, fever for 2 days.

EXAM:
PORTABLE CHEST 1 VIEW

[chest ap]
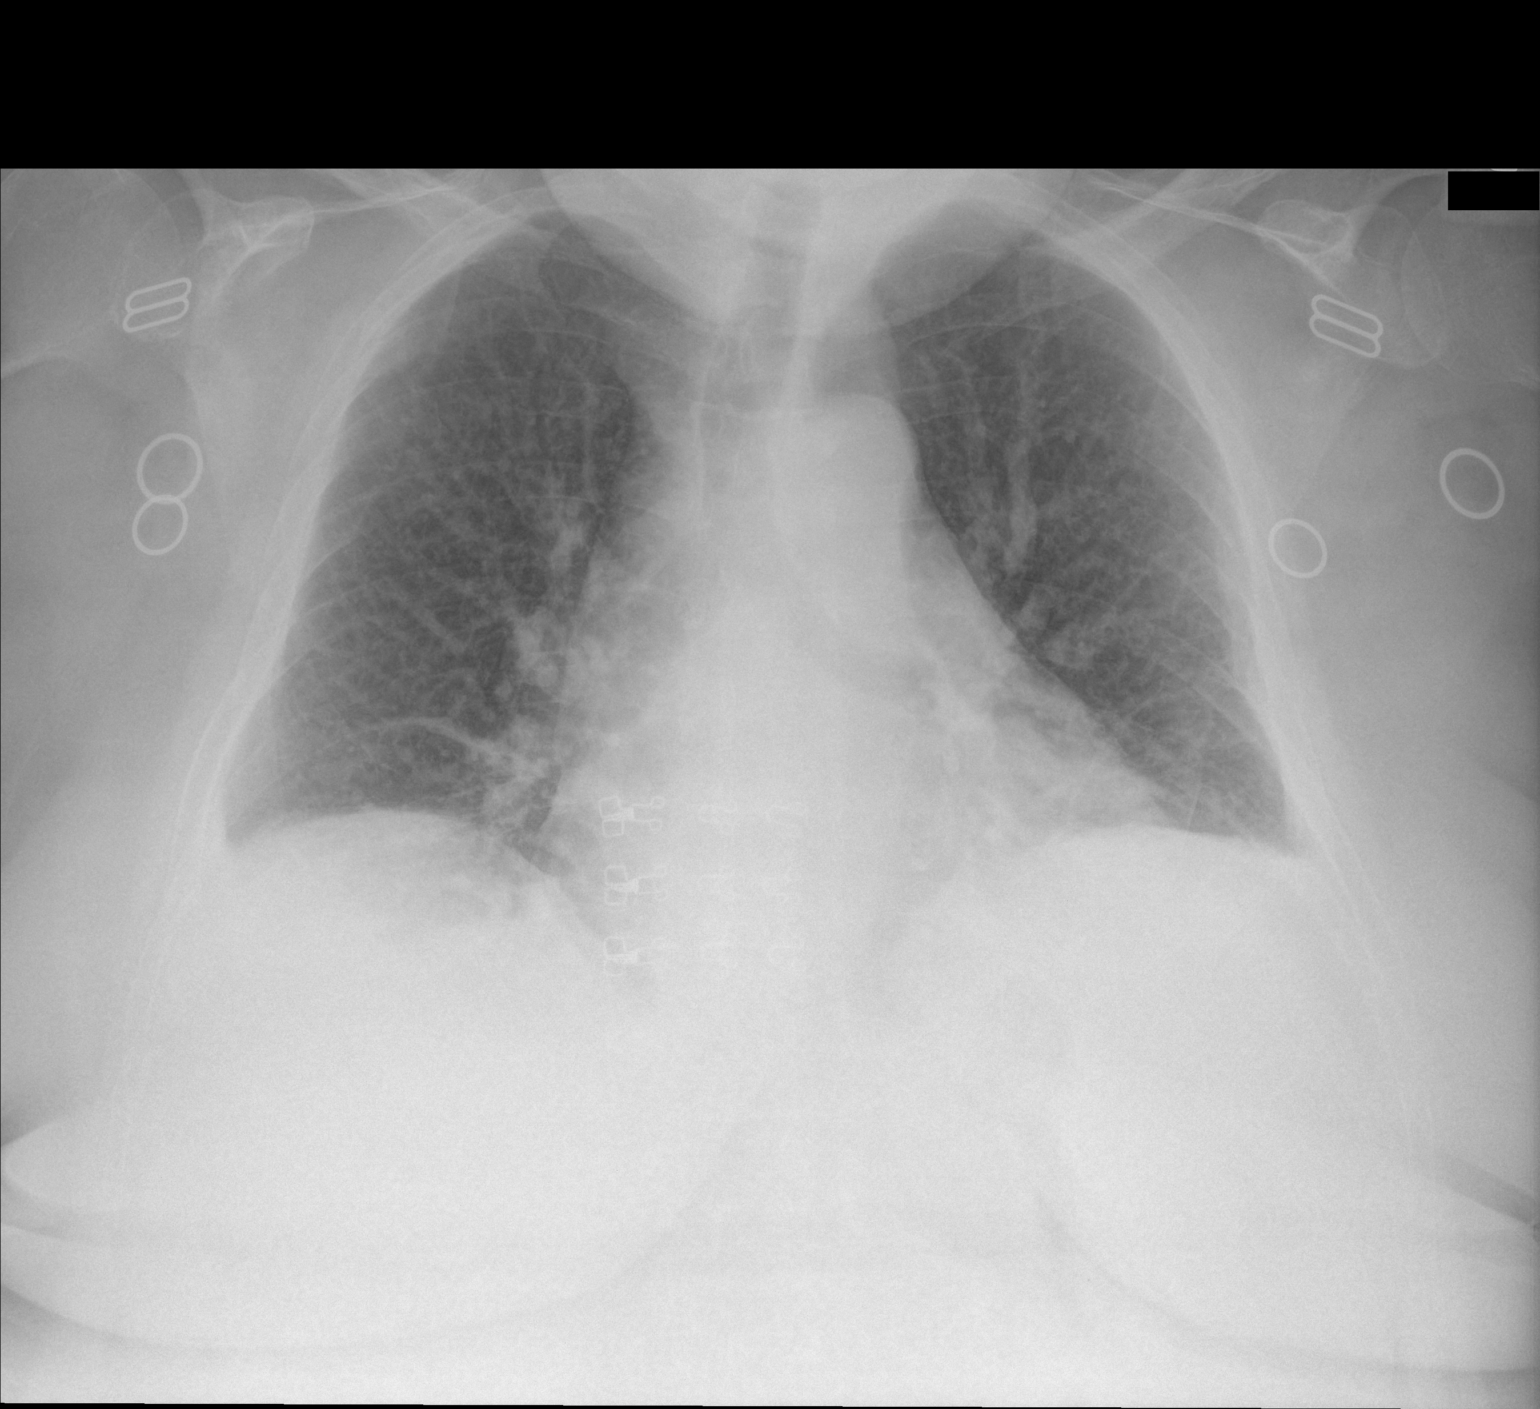

[1 of 1 positions shown; findings below may reference images not displayed]

FINDINGS: Study is hypoinspiratory with crowding of the perihilar and
bibasilar bronchovascular markings. Given the low lung volumes,
lungs appear clear. No evidence of pneumonia. No pleural effusion or
pneumothorax seen. Heart size and mediastinal contours are stable.
IMPRESSION: Low lung volumes. No active disease. No evidence of pneumonia or
pulmonary edema.

## 2020-04-07 MED ORDER — TACROLIMUS 1 MG PO CAPS
1.0000 mg | ORAL_CAPSULE | Freq: Every day | ORAL | Status: DC
  Administered 2020-04-07 – 2020-04-11 (×5): 1 mg via ORAL
  Filled 2020-04-07 (×5): qty 1

## 2020-04-07 MED ORDER — BRIMONIDINE TARTRATE 0.15 % OP SOLN
1.0000 [drp] | Freq: Two times a day (BID) | OPHTHALMIC | Status: DC
  Administered 2020-04-07 – 2020-04-11 (×8): 1 [drp] via OPHTHALMIC
  Filled 2020-04-07: qty 5

## 2020-04-07 MED ORDER — GABAPENTIN 100 MG PO CAPS
200.0000 mg | ORAL_CAPSULE | Freq: Three times a day (TID) | ORAL | Status: DC
  Administered 2020-04-07 – 2020-04-11 (×12): 200 mg via ORAL
  Filled 2020-04-07 (×12): qty 2

## 2020-04-07 MED ORDER — ARFORMOTEROL TARTRATE 15 MCG/2ML IN NEBU
15.0000 ug | INHALATION_SOLUTION | Freq: Two times a day (BID) | RESPIRATORY_TRACT | Status: DC
  Administered 2020-04-07 – 2020-04-11 (×8): 15 ug via RESPIRATORY_TRACT
  Filled 2020-04-07 (×12): qty 2

## 2020-04-07 MED ORDER — IPRATROPIUM-ALBUTEROL 0.5-2.5 (3) MG/3ML IN SOLN
RESPIRATORY_TRACT | Status: AC
  Administered 2020-04-07: 3 mL
  Filled 2020-04-07: qty 3

## 2020-04-07 MED ORDER — PREDNISONE 20 MG PO TABS
60.0000 mg | ORAL_TABLET | Freq: Every day | ORAL | Status: DC
  Administered 2020-04-08: 60 mg via ORAL
  Filled 2020-04-07: qty 3

## 2020-04-07 MED ORDER — MAGNESIUM SULFATE 2 GM/50ML IV SOLN
2.0000 g | Freq: Once | INTRAVENOUS | Status: AC
  Administered 2020-04-07: 2 g via INTRAVENOUS
  Filled 2020-04-07: qty 50

## 2020-04-07 MED ORDER — GLIPIZIDE 5 MG PO TABS
5.0000 mg | ORAL_TABLET | Freq: Every day | ORAL | Status: DC
  Filled 2020-04-07: qty 1

## 2020-04-07 MED ORDER — VALACYCLOVIR HCL 500 MG PO TABS
500.0000 mg | ORAL_TABLET | Freq: Two times a day (BID) | ORAL | Status: DC
  Administered 2020-04-07 – 2020-04-11 (×9): 500 mg via ORAL
  Filled 2020-04-07 (×10): qty 1

## 2020-04-07 MED ORDER — PREDNISONE 20 MG PO TABS
60.0000 mg | ORAL_TABLET | Freq: Once | ORAL | Status: AC
  Administered 2020-04-07: 60 mg via ORAL
  Filled 2020-04-07: qty 3

## 2020-04-07 MED ORDER — ALBUTEROL (5 MG/ML) CONTINUOUS INHALATION SOLN
10.0000 mg/h | INHALATION_SOLUTION | Freq: Once | RESPIRATORY_TRACT | Status: AC
  Administered 2020-04-07: 10 mg/h via RESPIRATORY_TRACT
  Filled 2020-04-07: qty 20

## 2020-04-07 MED ORDER — GLIPIZIDE 5 MG PO TABS
10.0000 mg | ORAL_TABLET | Freq: Every day | ORAL | Status: DC
  Administered 2020-04-07: 10 mg via ORAL
  Filled 2020-04-07 (×2): qty 1

## 2020-04-07 MED ORDER — GUAIFENESIN ER 600 MG PO TB12
600.0000 mg | ORAL_TABLET | Freq: Two times a day (BID) | ORAL | Status: DC
  Administered 2020-04-07 – 2020-04-11 (×8): 600 mg via ORAL
  Filled 2020-04-07 (×8): qty 1

## 2020-04-07 MED ORDER — MYCOPHENOLATE SODIUM 180 MG PO TBEC
180.0000 mg | DELAYED_RELEASE_TABLET | Freq: Two times a day (BID) | ORAL | Status: DC
  Administered 2020-04-07 – 2020-04-11 (×9): 180 mg via ORAL
  Filled 2020-04-07 (×10): qty 1

## 2020-04-07 MED ORDER — METHOCARBAMOL 500 MG PO TABS
500.0000 mg | ORAL_TABLET | Freq: Four times a day (QID) | ORAL | Status: DC
  Administered 2020-04-07 – 2020-04-11 (×16): 500 mg via ORAL
  Filled 2020-04-07 (×16): qty 1

## 2020-04-07 MED ORDER — SODIUM CHLORIDE 0.9% FLUSH: 3.0000 mL | Freq: Once | INTRAVENOUS | Status: DC

## 2020-04-07 MED ORDER — ACETAMINOPHEN 650 MG RE SUPP: 650.0000 mg | Freq: Four times a day (QID) | RECTAL | Status: DC | PRN

## 2020-04-07 MED ORDER — FLUOXETINE HCL 20 MG PO CAPS
40.0000 mg | ORAL_CAPSULE | Freq: Every day | ORAL | Status: DC
  Administered 2020-04-07 – 2020-04-11 (×5): 40 mg via ORAL
  Filled 2020-04-07 (×5): qty 2

## 2020-04-07 MED ORDER — LATANOPROST 0.005 % OP SOLN
1.0000 [drp] | Freq: Every day | OPHTHALMIC | Status: DC
  Administered 2020-04-07 – 2020-04-10 (×4): 1 [drp] via OPHTHALMIC
  Filled 2020-04-07 (×2): qty 2.5

## 2020-04-07 MED ORDER — PROSOURCE NO CARB PO LIQD
30.0000 mL | Freq: Two times a day (BID) | ORAL | Status: DC
  Filled 2020-04-07: qty 30

## 2020-04-07 MED ORDER — IPRATROPIUM-ALBUTEROL 0.5-2.5 (3) MG/3ML IN SOLN: 3.0000 mL | Freq: Four times a day (QID) | RESPIRATORY_TRACT | Status: DC

## 2020-04-07 MED ORDER — IPRATROPIUM-ALBUTEROL 0.5-2.5 (3) MG/3ML IN SOLN
3.0000 mL | Freq: Four times a day (QID) | RESPIRATORY_TRACT | Status: DC
  Administered 2020-04-07 – 2020-04-08 (×3): 3 mL via RESPIRATORY_TRACT
  Filled 2020-04-07 (×3): qty 3

## 2020-04-07 MED ORDER — ALBUTEROL SULFATE (2.5 MG/3ML) 0.083% IN NEBU
5.0000 mg | INHALATION_SOLUTION | Freq: Once | RESPIRATORY_TRACT | Status: AC
  Administered 2020-04-07: 2.5 mg via RESPIRATORY_TRACT
  Filled 2020-04-07: qty 6

## 2020-04-07 MED ORDER — SODIUM CHLORIDE 0.9 % IV SOLN
1.0000 g | INTRAVENOUS | Status: DC
  Administered 2020-04-07 – 2020-04-11 (×5): 1 g via INTRAVENOUS
  Filled 2020-04-07 (×4): qty 1
  Filled 2020-04-07: qty 10

## 2020-04-07 MED ORDER — INSULIN ASPART 100 UNIT/ML ~~LOC~~ SOLN
0.0000 [IU] | Freq: Every day | SUBCUTANEOUS | Status: DC
  Administered 2020-04-07: 4 [IU] via SUBCUTANEOUS

## 2020-04-07 MED ORDER — IPRATROPIUM BROMIDE 0.02 % IN SOLN
0.5000 mg | Freq: Once | RESPIRATORY_TRACT | Status: DC
  Filled 2020-04-07: qty 2.5

## 2020-04-07 MED ORDER — TACROLIMUS 0.5 MG PO CAPS
0.5000 mg | ORAL_CAPSULE | Freq: Every day | ORAL | Status: DC
  Administered 2020-04-07 – 2020-04-10 (×4): 0.5 mg via ORAL
  Filled 2020-04-07 (×5): qty 1

## 2020-04-07 MED ORDER — FUROSEMIDE 40 MG PO TABS
40.0000 mg | ORAL_TABLET | Freq: Every day | ORAL | Status: DC
  Administered 2020-04-07 – 2020-04-11 (×5): 40 mg via ORAL
  Filled 2020-04-07 (×3): qty 1
  Filled 2020-04-07: qty 2
  Filled 2020-04-07: qty 1

## 2020-04-07 MED ORDER — PANTOPRAZOLE SODIUM 40 MG PO TBEC
40.0000 mg | DELAYED_RELEASE_TABLET | Freq: Two times a day (BID) | ORAL | Status: DC
  Administered 2020-04-07 – 2020-04-11 (×8): 40 mg via ORAL
  Filled 2020-04-07 (×8): qty 1

## 2020-04-07 MED ORDER — DORZOLAMIDE HCL 2 % OP SOLN
1.0000 [drp] | Freq: Every day | OPHTHALMIC | Status: DC
  Administered 2020-04-07 – 2020-04-11 (×5): 1 [drp] via OPHTHALMIC
  Filled 2020-04-07: qty 10

## 2020-04-07 MED ORDER — DOCUSATE SODIUM 100 MG PO CAPS
100.0000 mg | ORAL_CAPSULE | Freq: Two times a day (BID) | ORAL | Status: DC
  Administered 2020-04-07 – 2020-04-11 (×7): 100 mg via ORAL
  Filled 2020-04-07 (×8): qty 1

## 2020-04-07 MED ORDER — CYCLOSPORINE 0.05 % OP EMUL
1.0000 [drp] | Freq: Two times a day (BID) | OPHTHALMIC | Status: DC
  Administered 2020-04-07 – 2020-04-11 (×9): 1 [drp] via OPHTHALMIC
  Filled 2020-04-07 (×10): qty 1

## 2020-04-07 MED ORDER — VERAPAMIL HCL ER 240 MG PO TBCR
240.0000 mg | EXTENDED_RELEASE_TABLET | Freq: Every day | ORAL | Status: DC
  Administered 2020-04-07 – 2020-04-11 (×5): 240 mg via ORAL
  Filled 2020-04-07 (×5): qty 1

## 2020-04-07 MED ORDER — GUAIFENESIN ER 600 MG PO TB12: 600.0000 mg | ORAL_TABLET | Freq: Two times a day (BID) | ORAL | Status: DC

## 2020-04-07 MED ORDER — ASPIRIN 81 MG PO CHEW
81.0000 mg | CHEWABLE_TABLET | Freq: Every day | ORAL | Status: DC
  Administered 2020-04-07 – 2020-04-11 (×5): 81 mg via ORAL
  Filled 2020-04-07 (×5): qty 1

## 2020-04-07 MED ORDER — BUDESONIDE 0.5 MG/2ML IN SUSP
2.0000 mg | Freq: Four times a day (QID) | RESPIRATORY_TRACT | Status: DC
  Filled 2020-04-07: qty 8

## 2020-04-07 MED ORDER — BUDESONIDE 0.25 MG/2ML IN SUSP
0.2500 mg | Freq: Two times a day (BID) | RESPIRATORY_TRACT | Status: DC
  Administered 2020-04-07 – 2020-04-11 (×8): 0.25 mg via RESPIRATORY_TRACT
  Filled 2020-04-07 (×11): qty 2

## 2020-04-07 MED ORDER — ACETAMINOPHEN 325 MG PO TABS: 650.0000 mg | ORAL_TABLET | Freq: Four times a day (QID) | ORAL | Status: DC | PRN

## 2020-04-07 MED ORDER — INSULIN ASPART 100 UNIT/ML ~~LOC~~ SOLN: 0.0000 [IU] | Freq: Three times a day (TID) | SUBCUTANEOUS | Status: DC

## 2020-04-07 MED ORDER — ALLOPURINOL 300 MG PO TABS
300.0000 mg | ORAL_TABLET | Freq: Every day | ORAL | Status: DC
  Administered 2020-04-07 – 2020-04-11 (×5): 300 mg via ORAL
  Filled 2020-04-07 (×5): qty 1

## 2020-04-07 MED ORDER — MAGNESIUM OXIDE 400 (241.3 MG) MG PO TABS
400.0000 mg | ORAL_TABLET | Freq: Two times a day (BID) | ORAL | Status: DC
  Administered 2020-04-08 – 2020-04-11 (×7): 400 mg via ORAL
  Filled 2020-04-07 (×8): qty 1

## 2020-04-07 MED ORDER — PRO-STAT SUGAR FREE PO LIQD
30.0000 mL | Freq: Two times a day (BID) | ORAL | Status: DC
  Administered 2020-04-10: 30 mL via ORAL
  Filled 2020-04-07 (×8): qty 30

## 2020-04-07 MED ORDER — SODIUM CHLORIDE 0.9% FLUSH
3.0000 mL | Freq: Two times a day (BID) | INTRAVENOUS | Status: DC
  Administered 2020-04-08 – 2020-04-11 (×7): 3 mL via INTRAVENOUS

## 2020-04-07 MED ORDER — ONDANSETRON HCL 4 MG/2ML IJ SOLN: 4.0000 mg | Freq: Four times a day (QID) | INTRAMUSCULAR | Status: DC | PRN

## 2020-04-07 MED ORDER — GLIPIZIDE 5 MG PO TABS: 5.0000 mg | ORAL_TABLET | ORAL | Status: DC

## 2020-04-07 NOTE — ED Notes (Signed)
Called lab to add on BNP. ?

## 2020-04-07 NOTE — TOC Initial Note (Addendum)
Transition of Care [facility]) - Initial/Assessment Note    Patient Details  Name: April [name] MRN: [id number] Date of Birth: May 23, 1956  Transition of Care [facility]) CM/SW Contact:    Verdell Carmine, RN Phone Number: [phone] 04/07/2020, 9:33 AM  Clinical Narrative:                 Patient being admitted for shortness of breath COPD exacerbation. [name] wishes to go back there to continue. Rehab.  Social Work and Care management will follow for needs  Expected Discharge Plan: [place] Barriers to Discharge: Continued Medical Work up   Patient Goals and CMS Choice Patient states their goals for this hospitalization and ongoing recovery are:: Back to [employer] reahb      Expected Discharge Plan and Services Expected Discharge Plan: [place] In-house Referral: Clinical Social Work                                            Prior Living Arrangements/Services   Lives with:: Facility Resident Patient language and need for interpreter reviewed:: Yes        Need for Family Participation in Patient Care: No (Comment) Care giver support system in place?: Yes (comment)   Criminal Activity/Legal Involvement Pertinent to Current Situation/Hospitalization: No - Comment as needed  Activities of Daily Living      Permission Sought/Granted         Permission granted to share info w AGENCY: [facility]        Emotional Assessment Appearance:: Appears older than stated age Attitude/Demeanor/Rapport: Other (comment)(quiet tired) Affect (typically observed): Flat Orientation: : Oriented to Self, Oriented to Place, Oriented to  Time, Oriented to Situation Alcohol / Substance Use: Tobacco Use Psych Involvement: No (comment)  Admission diagnosis:  Chest Pain Patient Active Problem List   Diagnosis Date Noted  . Acute on chronic respiratory failure with hypoxia ([place]) 03/14/2020  . PNA (pneumonia) 03/13/2020  .  SVT (supraventricular tachycardia) ([place])   . Anemia of chronic disease   . Palliative care encounter   . Goals of care, counseling/discussion   . Essential hypertension 02/08/2020  . Glaucoma 02/08/2020  . Sepsis ([place]) 01/31/2020  . Hypoxia   . Shortness of breath   . Fracture of left superior pubic ramus (HCC)   . Pain   . Anxiety state   . Left displaced femoral neck fracture ([place]) 12/15/2019  . Status post total hip replacement, left   . Post-op pain   . Acute blood loss anemia   . Polycystic kidney   . Closed left hip fracture ([place]) 12/07/2019  . Left wrist fracture 12/07/2019  . Medication management 09/22/2019  . Physical deconditioning 09/22/2019  . Hyponatremia 06/29/2019  . Acute respiratory failure with hypoxia ([place]) 04/19/2019  . Acute on chronic diastolic CHF (congestive heart failure) ([place]) 04/19/2019  . Bilateral closed proximal tibial fracture 12/21/2018  . Closed right ankle fracture 12/21/2018  . Fracture 12/21/2018  . Lower urinary tract infectious disease 10/03/2018  . Asthma, chronic, unspecified asthma severity, with acute exacerbation 10/03/2018  . SIRS (systemic inflammatory response syndrome) ([place]) 10/03/2018  . Nausea vomiting and diarrhea 10/02/2018  . Chronic diastolic CHF (congestive heart failure) ([place]) 10/01/2017  . Influenza B 10/01/2017  . Persistent asthma with status asthmaticus   . Pneumonia 07/15/2016  . Asthma, mild intermittent 07/15/2016  .  GERD (gastroesophageal reflux disease) 07/15/2016  . Renal transplant recipient 07/15/2016  . Hyperlipidemia 07/15/2016  . Depression 07/15/2016  . Bronchitis, mucopurulent recurrent ([place]) 08/08/2014  . Chronic cough 07/24/2014  . End stage renal disease ([place]) 03/23/2012  . 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, implant, and graft 03/23/2012   PCP:  [name], MD Pharmacy:  No Pharmacies Listed    Social Determinants of Health (SDOH) Interventions    Readmission Risk  Interventions Readmission Risk Prevention Plan 02/09/2020 04/20/2019  Transportation Screening Complete Complete  PCP or Specialist Appt within 3-5 Days - Not Complete  Not Complete comments - not yet ready for d/c  HRI or [place] - Not Complete  HRI or Home Care Consult comments - no needs identified  Social Work Consult for [place] Planning/Counseling - Not Complete  SW consult not completed comments - no needs identified  Palliative Care Screening - Not Applicable  Medication Review (RN Care Manager) Referral to Pharmacy Complete  PCP or Specialist appointment within 3-5 days of discharge Complete -  [place] or Home Care Consult Complete -  [place] Patient Refused -  Some recent data might be hidden

## 2020-04-07 NOTE — ED Notes (Signed)
Pt to xray

## 2020-04-07 NOTE — H&P (Addendum)
History and Physical    [name] WGN:[id number] DOB: 09-18-1956 DOA: 04/07/2020  Referring MD/NP/PA: Marda Stalker, MD PCP: April Caraway, MD  Patient coming from: Rehab via EMS  Chief Complaint: Shortness of breath and chest pain  I have personally briefly reviewed patient's old medical records in [place]   HPI: April [name] is a 64 y.o. female with medical history significant of COPD, chronic O2 dependent respiratory failure, diastolic CHF, hyperlipidemia, polycystic kidney disease s/p renal transplant on immunosuppressive therapy, and NSVT's with complaints of shortness of breath and chest pain.  She had been sent to rehab following her last discharge from the hospital on 4/26.  Patient had been doing okay up until 2 days ago when she started to not feel well.  Reports having increased productive cough and substernal tightness/pressure feeling.  Notes associated symptoms of wheezing, nausea, weight loss, intermittent sweats(chronic), and constipation (chronic).  Denies having any fevers, vomiting, diarrhea, leg swelling, or dark stools to her knowledge.  It appears she has been admitted in the hospital 4 times in the last 2 months with COPD exacerbations and concerns for infection.  Records note Klebsiella pneumonia from her last urine culture on 4/21, where she had received antibiotics with adequate coverage.  ED Course: Upon admission to the emergency department patient was seen to be afebrile, heart rates 100-105, respirations 22-35, blood pressures maintained, and O2 saturations 94 to 99% on 3 L of nasal cannula oxygen.  Labs significant for hemoglobin 7.1 and troponin.  ABG noted pH of 7.486, PCO2 41.6, and PO2 214.  Chest x-ray showed no acute abnormalities.  Patient was given a DuoNeb, hour-long continuous albuterol neb, 60 mg of prednisone, and 2 g of magnesium.  Attempts were made to possibly discharge patient home, but she continued to have shortness of  breath.   Review of Systems  Constitutional: Positive for diaphoresis, malaise/fatigue and weight loss. Negative for fever.  HENT: Negative for ear discharge and nosebleeds.   Eyes: Negative for photophobia.  Respiratory: Positive for cough, shortness of breath and wheezing.   Cardiovascular: Positive for chest pain. Negative for leg swelling.  Gastrointestinal: Positive for constipation and nausea. Negative for abdominal pain, diarrhea and vomiting.  Genitourinary: Negative for dysuria and hematuria.  Musculoskeletal: Negative for falls.  Neurological: Positive for weakness. Negative for focal weakness and loss of consciousness.  Endo/Heme/Allergies: Bruises/bleeds easily.  Psychiatric/Behavioral: Negative for substance abuse.    Past Medical History:  Diagnosis Date  . Arthritis   . Asthma   . CHF (congestive heart failure) ([place])   . Depression   . Essential hypertension 02/08/2020  . GERD (gastroesophageal reflux disease)   . Hyperlipidemia   . Hyperparathyroidism   . Polycystic kidney   . PONV (postoperative nausea and vomiting)     Past Surgical History:  Procedure Laterality Date  . 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 BILATERAL OOPHORECTOMY    . CARPAL TUNNEL RELEASE  2000  . CESAREAN SECTION    . INSERTION OF DIALYSIS CATHETER  03/31/2012   Procedure: INSERTION OF DIALYSIS CATHETER;  Surgeon: Angelia Mould, MD;  Location: [facility];  Service: Vascular;  Laterality: N/A;  insertion of dialysis catheter right internal jugular vein  . 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 ORIF TIBIA FRACTURE Left 12/23/2018   Procedure: OPEN REDUCTION INTERNAL FIXATION (ORIF) TIBIA FRACTURE;  Surgeon: Shona Needles, MD;  Location: [facility];  Service: Orthopedics;  Laterality:  Left;  . ORIF TIBIA PLATEAU Right 12/23/2018   Procedure: OPEN REDUCTION INTERNAL FIXATION (ORIF) TIBIAL PLATEAU;  Surgeon: Shona Needles, MD;  Location: [facility];  Service: Orthopedics;   Laterality: Right;  . ORIF WRIST FRACTURE Left 12/08/2019   Procedure: OPEN REDUCTION INTERNAL FIXATION (ORIF) WRIST FRACTURE, REPAIR LACERATION LEFT FOREARM;  Surgeon: Dorna Leitz, MD;  Location: [facility];  Service: Orthopedics;  Laterality: Left;  . TONSILLECTOMY  1967  . TOTAL HIP ARTHROPLASTY Left 12/08/2019   Procedure: TOTAL HIP ARTHROPLASTY ANTERIOR APPROACH;  Surgeon: Dorna Leitz, MD;  Location: [facility];  Service: Orthopedics;  Laterality: Left;  . TUBAL LIGATION  2010     reports that she quit smoking about 22 years ago. Her smoking use included cigarettes. She has a 3.75 pack-year smoking history. She has never used smokeless tobacco. She reports that she does not drink alcohol or use drugs.  Allergies  Allergen Reactions  . Infed [Iron Dextran] Other (See Comments)    Chest tightness  . Pentamidine Itching, Shortness Of Breath and Swelling  . Erythromycin [Erythromycin] Other (See Comments)    Mouth Ulcers  . Iohexol Other (See Comments)    Per patient "she has had a kidney transplant and should never have contrast"  . Oxycodone Nausea Only and Nausea And Vomiting  . Erythromycin Rash    Causes breakout in mouth  . Ultram [Tramadol Hcl] Anxiety    Family History  Problem Relation Age of Onset  . Hypertension Mother   . Heart disease Father        CABG history  . Polycystic kidney disease Father   . Polycystic kidney disease Brother     Prior to Admission medications   Medication Sig Start Date End Date Taking? Authorizing Provider  allopurinol (ZYLOPRIM) 300 MG tablet Take 1 tablet (300 mg total) by mouth daily. 01/04/20  Yes Angiulli, Lavon Paganini, PA-C  aspirin 81 MG chewable tablet Chew 81 mg by mouth daily.   Yes [provider]  brimonidine (ALPHAGAN P) 0.1 % SOLN Place 1 drop 2 (two) times daily into the left eye.   Yes [provider]  cholecalciferol (VITAMIN D3) 25 MCG (1000 UNIT) tablet Take 1 tablet (1,000 Units total) by mouth daily. 01/04/20   Yes Angiulli, Lavon Paganini, PA-C  cycloSPORINE (RESTASIS) 0.05 % ophthalmic emulsion Place 1 drop into both eyes 2 (two) times daily. 01/04/20  Yes Angiulli, Lavon Paganini, PA-C  docusate sodium (COLACE) 100 MG capsule Take 1 capsule (100 mg total) by mouth 2 (two) times daily. 12/13/19  Yes Shelly Coss, MD  dorzolamide (TRUSOPT) 2 % ophthalmic solution Place 1 drop into both eyes daily. 06/25/19  Yes [provider]  ferrous sulfate 325 (65 FE) MG tablet Take 325 mg by mouth daily with breakfast.   Yes [provider]  FLUoxetine (PROZAC) 40 MG capsule Take 1 capsule (40 mg total) by mouth daily. 01/04/20  Yes Angiulli, Lavon Paganini, PA-C  furosemide (LASIX) 40 MG tablet Take 1 tablet (40 mg total) by mouth daily. 02/07/20  Yes Sheikh, Omair Latif, DO  gabapentin (NEURONTIN) 100 MG capsule Take 200 mg by mouth 3 (three) times daily.   Yes [provider]  glipiZIDE (GLUCOTROL) 10 MG tablet Take 5-10 mg by mouth See admin instructions. Take 1 tablet every morning and take 1/2 tablet every evening   Yes [provider]  guaiFENesin (MUCINEX) 600 MG 12 hr tablet Take 1 tablet (600 mg total) by mouth 2 (two) times daily.  02/06/20  Yes Sheikh, Omair Latif, DO  ipratropium-albuterol (DUONEB) 0.5-2.5 (3) MG/3ML SOLN Take 3 mLs by nebulization 2 (two) times daily. 03/18/20 04/17/20 Yes Arrien, Jimmy Picket, MD  magnesium oxide (MAG-OX) 400 MG tablet Take 1 tablet (400 mg total) by mouth 2 (two) times daily. 01/04/20  Yes Angiulli, Lavon Paganini, PA-C  methocarbamol (ROBAXIN) 500 MG tablet Take 1 tablet (500 mg total) by mouth 4 (four) times daily. 01/04/20  Yes Angiulli, Lavon Paganini, PA-C  Multiple Vitamin (MULTIVITAMIN WITH MINERALS) TABS tablet Take 1 tablet by mouth daily. 02/24/20  Yes Alma Friendly, MD  mycophenolate (MYFORTIC) 180 MG EC tablet Take 180 mg by mouth 2 (two) times daily.   Yes [provider]  pantoprazole (PROTONIX) 40 MG tablet Take 1 tablet (40 mg total) by  mouth 2 (two) times daily. 01/04/20  Yes Angiulli, Lavon Paganini, PA-C  predniSONE (DELTASONE) 5 MG tablet Take 1.5 tablets (7.5 mg total) by mouth daily with breakfast. 03/18/20  Yes Arrien, Jimmy Picket, MD  PROGRAF 1 MG capsule Take 1 mg by mouth daily. 03/22/19  Yes [provider]  protein supplement (PROSOURCE NO CARB) LIQD Take 30 mLs by mouth in the morning and at bedtime.   Yes [provider]  SYMBICORT 160-4.5 MCG/ACT inhaler Inhale 2 puffs into the lungs 2 (two) times daily. 01/04/20  Yes Angiulli, Lavon Paganini, PA-C  tacrolimus (PROGRAF) 0.5 MG capsule Take 0.5 mg by mouth at bedtime. Takes brand name Prograf   Yes [provider]  TRAVATAN Z 0.004 % SOLN ophthalmic solution Place 1 drop into both eyes at bedtime. 07/03/16  Yes [provider]  UNABLE TO FIND Take 120 mLs by mouth in the morning and at bedtime. Med Name: MedPass 2.0   Yes [provider]  valACYclovir (VALTREX) 500 MG tablet Take 500 mg by mouth 2 (two) times daily.   Yes [provider]  verapamil (CALAN-SR) 240 MG CR tablet Take 1 tablet (240 mg total) by mouth daily. 03/19/20 04/18/20 Yes Arrien, Jimmy Picket, MD  vitamin B-12 (CYANOCOBALAMIN) 100 MCG tablet Take 100 mcg by mouth daily.   Yes [provider]  albuterol (VENTOLIN HFA) 108 (90 Base) MCG/ACT inhaler Inhale 2 puffs into the lungs every 4 (four) hours as needed for wheezing or shortness of breath. Patient not taking: Reported on 04/07/2020 01/04/20   Angiulli, Lavon Paganini, PA-C  clonazePAM (KLONOPIN) 0.25 MG disintegrating tablet Take 1 tablet (0.25 mg total) by mouth 2 (two) times daily as needed (Anxiety). Patient not taking: Reported on 04/07/2020 03/18/20   Arrien, Jimmy Picket, MD  feeding supplement, ENSURE ENLIVE, (ENSURE ENLIVE) LIQD Take 237 mLs by mouth 2 (two) times daily between meals. Patient not taking: Reported on 04/07/2020 02/23/20   Alma Friendly, MD  nystatin (MYCOSTATIN) 100000  UNIT/ML suspension Use as directed 5 mLs (500,000 Units total) in the mouth or throat 4 (four) times daily. Patient not taking: Reported on 04/07/2020 02/23/20   Alma Friendly, MD  polyethylene glycol (MIRALAX / GLYCOLAX) 17 g packet Take 17 g by mouth daily as needed for mild constipation. Patient not taking: Reported on 04/07/2020 12/13/19   Shelly Coss, MD  saccharomyces boulardii (FLORASTOR) 250 MG capsule Take 1 capsule (250 mg total) by mouth 2 (two) times daily. Patient not taking: Reported on 04/07/2020 02/06/20   Raiford Noble Latif, DO  sodium chloride (OCEAN) 0.65 % SOLN nasal spray Place 1 spray into both nostrils as needed for congestion. Patient not taking:  Reported on 04/07/2020 02/06/20   Kerney Elbe, DO    Physical Exam:  Constitutional: Older female who appears to be lethargic but easily arousable Vitals:   04/07/20 0815 04/07/20 0830 04/07/20 0845 04/07/20 0900  BP: 129/60 (!) 144/56 (!) 141/60 136/61  Pulse: (!) 105 (!) 104 (!) 105 (!) 102  Resp: (!) 29 (!) 28 (!) 26 (!) 25  Temp:      SpO2: 94% 98% 96% 99%  Weight:      Height:       Eyes: PERRL, lids and conjunctivae normal ENMT: Mucous membranes are moist. Posterior pharynx clear of any exudate or lesions.  Neck: normal, supple, no masses, no thyromegaly Respiratory: Decreased aeration with mild expiratory wheezes appreciated at this time.  Patient on 2 L nasal cannula oxygen with O2 saturations maintained. Cardiovascular: Regular rate and rhythm, no murmurs / rubs / gallops. No extremity edema. 2+ pedal pulses. No carotid bruits.  Abdomen: no tenderness, no masses palpated. No hepatosplenomegaly. Bowel sounds positive.  Musculoskeletal: no clubbing / cyanosis. No joint deformity upper and lower extremities. Good ROM, no contractures. Normal muscle tone.  Skin: Significant bruising noted of the bilateral upper extremities. Neurologic: CN 2-12 grossly intact. Sensation intact, DTR normal. Strength 5/5 in  all 4.  Psychiatric: Normal judgment and insight. Alert and oriented x 3. Normal mood.     Labs on Admission: I have personally reviewed following labs and imaging studies  CBC: Recent Labs  Lab 04/07/20 0500 04/07/20 0608  WBC 9.6  --   HGB 7.1* 9.5*  HCT 24.6* 28.0*  MCV 99.2  --   PLT 285  --    Basic Metabolic Panel: Recent Labs  Lab 04/07/20 0500 04/07/20 0608  NA 135 135  K 3.9 3.9  CL 98  --   CO2 27  --   GLUCOSE 90  --   BUN 21  --   CREATININE 0.72  --   CALCIUM 9.4  --    GFR: Estimated Creatinine Clearance: 67 mL/min (by C-G formula based on SCr of 0.72 mg/dL). Liver Function Tests: No results for input(s): AST, ALT, ALKPHOS, BILITOT, PROT, ALBUMIN in the last 168 hours. No results for input(s): LIPASE, AMYLASE in the last 168 hours. No results for input(s): AMMONIA in the last 168 hours. Coagulation Profile: No results for input(s): INR, PROTIME in the last 168 hours. Cardiac Enzymes: No results for input(s): CKTOTAL, CKMB, CKMBINDEX, TROPONINI in the last 168 hours. BNP (last 3 results) No results for input(s): PROBNP in the last 8760 hours. HbA1C: No results for input(s): HGBA1C in the last 72 hours. CBG: No results for input(s): GLUCAP in the last 168 hours. Lipid Profile: No results for input(s): CHOL, HDL, LDLCALC, TRIG, CHOLHDL, LDLDIRECT in the last 72 hours. Thyroid Function Tests: No results for input(s): TSH, T4TOTAL, FREET4, T3FREE, THYROIDAB in the last 72 hours. Anemia Panel: No results for input(s): VITAMINB12, FOLATE, FERRITIN, TIBC, IRON, RETICCTPCT in the last 72 hours. Urine analysis:    Component Value Date/Time   COLORURINE YELLOW 03/13/2020 1138   APPEARANCEUR CLEAR 03/13/2020 1138   LABSPEC 1.012 03/13/2020 1138   PHURINE 5.0 03/13/2020 1138   GLUCOSEU NEGATIVE 03/13/2020 1138   HGBUR SMALL (A) 03/13/2020 1138   BILIRUBINUR NEGATIVE 03/13/2020 1138   KETONESUR NEGATIVE 03/13/2020 1138   PROTEINUR 100 (A) 03/13/2020  1138   UROBILINOGEN 0.2 05/15/2013 0733   NITRITE NEGATIVE 03/13/2020 1138   LEUKOCYTESUR SMALL (A) 03/13/2020 1138   Sepsis Labs:  Recent Results (from the past 240 hour(s))  SARS Coronavirus 2 by RT PCR (hospital order, performed in [facility] hospital lab) Nasopharyngeal Nasopharyngeal Swab     Status: None   Collection Time: 04/07/20  5:48 AM   Specimen: Nasopharyngeal Swab  Result Value Ref Range Status   SARS Coronavirus 2 NEGATIVE NEGATIVE Final    Comment: (NOTE) SARS-CoV-2 target nucleic acids are NOT DETECTED. The SARS-CoV-2 RNA is generally detectable in upper and lower respiratory specimens during the acute phase of infection. The lowest concentration of SARS-CoV-2 viral copies this assay can detect is 250 copies / mL. A negative result does not preclude SARS-CoV-2 infection and should not be used as the sole basis for treatment or other patient management decisions.  A negative result may occur with improper specimen collection / handling, submission of specimen other than nasopharyngeal swab, presence of viral mutation(s) within the areas targeted by this assay, and inadequate number of viral copies (<250 copies / mL). A negative result must be combined with clinical observations, patient history, and epidemiological information. Fact Sheet for Patients:   StrictlyIdeas.no Fact Sheet for Healthcare Providers: BankingDealers.co.za This test is not yet approved or cleared  by the [place] FDA and has been authorized for detection and/or diagnosis of SARS-CoV-2 by FDA under an Emergency Use Authorization (EUA).  This EUA will remain in effect (meaning this test can be used) for the duration of the COVID-19 declaration under Section 564(b)(1) of the Act, 21 U.S.C. section 360bbb-3(b)(1), unless the authorization is terminated or revoked sooner. Performed at [facility], [place] [address] [place],  [place] [address]      Radiological Exams on Admission: DG Chest 2 View  Result Date: 04/07/2020 CLINICAL DATA:  Chest pain, lethargy. EXAM: CHEST - 2 VIEW COMPARISON:  Chest x-rays dated 03/14/2020 and 02/29/2020. FINDINGS: Stable mild cardiomegaly. No confluent opacity to suggest consolidating pneumonia. No pleural effusion or pneumothorax is seen. Osseous structures about the chest are unremarkable. IMPRESSION: No active cardiopulmonary disease. No evidence of pneumonia or pulmonary edema. Electronically Signed   By: Franki Cabot M.D.   On: 04/07/2020 05:28    EKG: Independently reviewed.  Sinus tachycardia 103 bpm  Assessment/Plan  Respiratory failure with hypoxia  COPD exacerbation: Acute on chronic.  Patient presents with complaints of shortness and productive cough.  Found to be acutely wheezing on physical exam.  Chest x-ray otherwise noted to be clear.  O2 saturations maintained currently on 3 L of nasal cannula oxygen.  Patient has been started on prednisone and given breathing treatment. -Admit to medical telemetry bed -COPD order set utilized -Continuous pulse oximetry with nasal cannula oxygen to maintain O2 saturations -DuoNebs 4 times daily -Brovana and budesonide nebs twice daily -Rocephin IV -Prednisone 60 mg daily -Mucinex -PT/OT consulted  Normocytic anemia: Acute on chronic.  Hemoglobin noted to be 7.1 on admission.  Appears to have been trending downward without clear cause. -Follow-up stool guaiac (negative) -Continue to monitor H&H -Received blood products for hemoglobin less than 7  Diastolic congestive heart failure:  No significant signs of lower extremity edema noted on physical exam. Last EF noted to be 55 to 60% in 03/2019. BNP was mildly elevated at 494.5, but relatively stable when compared to previous checks earlier this year. Home medications include furosemide 40 mg daily. -Monitor intake and output -Daily weights -Continue furosemide  History of SVT:  Home meds include verapamil 240 mg daily. -Continue verapamil  Hypertension: Stable. -Continue current medication regimen  S/p renal transplant on chronic immunosuppression: Patient on immunosuppressive therapy of prednisone 7.5 mg daily, mycophenolate, and tacrolimus.  -Continue mycophenolate  and tacrolimus  Hyperglycemia: Not present on admission.  On admission glucose noted to be within normal limits at 90.  Due to chronic steroid use it appears she is on glipizide 10 mg every morning and 5 mg nightly.  No history of diabetes and hemoglobin A1c's have been within normal limits.  Given increased dose of steroids. -Hypoglycemic protocols -Continue glipizide -CBGs q. before meals and at bedtime -Consider adding on a sliding scale insulin if blood sugars start trending above 180-200  Anxiety -Continue home clonazepam and fluoxetine  Obesity: BMI 30.8 kg/m  DVT prophylaxis: SCDs Code Status: Full Family Communication:  Disposition Plan: Possible discharge back to rehab in 2 to 3 days Consults called: None Admission status: Observation  Norval Morton MD [facility] Pager [phone]   If 7PM-7AM, please contact night-coverage [email or web address] Password [facility]  04/07/2020, 9:44 AM

## 2020-04-07 NOTE — ED Provider Notes (Signed)
Care assumed from Dr. Christy Gentles.  At time of transfer care, patient is awaiting results of reassessment after hour-long nebulizer treatment and reassessment with labs.  Patient laboratory studies showed downtrending hemoglobin with a hemoglobin of 7.1.  Otherwise laboratory testing appeared similar to prior.  They suspected primarily COPD exacerbation that may been exacerbated by the chronic anemia.  If patient is still having difficulty breathing, anticipate admission.  8:57 AM I just reassessed the patient and she is still tachypneic in the 30s and tachycardic.  Her lungs were still very wheezing despite hour-long breathing treatment.  I talked to the patient and she feels scared going home given her breathing is not improving significantly.  She also was concerned about her hemoglobin downtrending.    We will give her magnesium and another breathing treatment and call for admission for her.  Clinical Impression: 1. COPD exacerbation ([place])   2. Acute respiratory distress     Disposition: Admit  This note was prepared with assistance of Dragon voice recognition software. Occasional wrong-word or sound-a-like substitutions may have occurred due to the inherent limitations of voice recognition software.     Tegeler, Gwenyth Allegra, MD 04/07/20 [phone]

## 2020-04-07 NOTE — ED Notes (Signed)
[phone] Michaels, relative would like an update

## 2020-04-07 NOTE — ED Provider Notes (Signed)
[facility] EMERGENCY DEPARTMENT Provider Note   CSN: [id number] Arrival date & time: 04/07/20  0433     History Chief Complaint  Patient presents with  . Chest Pain    April [name] is a 64 y.o. female.  The history is provided by the patient.  Chest Pain Pain location:  Substernal area Pain quality: tightness   Pain radiates to:  Does not radiate Pain severity:  Moderate Timing:  Constant Chronicity:  New Relieved by:  Nothing Worsened by:  Nothing Associated symptoms: fatigue, nausea and shortness of breath   Associated symptoms: no back pain, no fever and no headache    Patient with complicated medical history including polycystic kidney disease s/p transplant, COPD on oxygen presents with chest pain shortness of breath.  Patient stays at a local nursing facility.  Patient reports she has had chest pain for the past 12 hours.  She also reports decreased p.o.  Patient was given nitroglycerin and aspirin.    Past Medical History:  Diagnosis Date  . Arthritis   . Asthma   . CHF (congestive heart failure) ([place])   . Depression   . Essential hypertension 02/08/2020  . GERD (gastroesophageal reflux disease)   . Hyperlipidemia   . Hyperparathyroidism   . Polycystic kidney   . PONV (postoperative nausea and vomiting)     Patient Active Problem List   Diagnosis Date Noted  . Acute on chronic respiratory failure with hypoxia ([place]) 03/14/2020  . PNA (pneumonia) 03/13/2020  . SVT (supraventricular tachycardia) ([place])   . Anemia of chronic disease   . Palliative care encounter   . Goals of care, counseling/discussion   . Essential hypertension 02/08/2020  . Glaucoma 02/08/2020  . Sepsis ([place]) 01/31/2020  . Hypoxia   . Shortness of breath   . Fracture of left superior pubic ramus (H[facility])   . Pain   . Anxiety state   . Left displaced femoral neck fracture ([place]) 12/15/2019  . Status post total hip replacement, left   . Post-op pain   . Acute blood  loss anemia   . Polycystic kidney   . Closed left hip fracture ([place]) 12/07/2019  . Left wrist fracture 12/07/2019  . Medication management 09/22/2019  . Physical deconditioning 09/22/2019  . Hyponatremia 06/29/2019  . Acute respiratory failure with hypoxia ([place]) 04/19/2019  . Acute on chronic diastolic CHF (congestive heart failure) ([place]) 04/19/2019  . Bilateral closed proximal tibial fracture 12/21/2018  . Closed right ankle fracture 12/21/2018  . Fracture 12/21/2018  . Lower urinary tract infectious disease 10/03/2018  . Asthma, chronic, unspecified asthma severity, with acute exacerbation 10/03/2018  . SIRS (systemic inflammatory response syndrome) ([place]) 10/03/2018  . Nausea vomiting and diarrhea 10/02/2018  . Chronic diastolic CHF (congestive heart failure) ([place]) 10/01/2017  . Influenza B 10/01/2017  . Persistent asthma with status asthmaticus   . Pneumonia 07/15/2016  . Asthma, mild intermittent 07/15/2016  . GERD (gastroesophageal reflux disease) 07/15/2016  . Renal transplant recipient 07/15/2016  . Hyperlipidemia 07/15/2016  . Depression 07/15/2016  . Bronchitis, mucopurulent recurrent ([place]) 08/08/2014  . Chronic cough 07/24/2014  . End stage renal disease ([place]) 03/23/2012  . 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 BILATERAL OOPHORECTOMY    . CARPAL TUNNEL RELEASE  2000  . CESAREAN SECTION    . INSERTION OF  DIALYSIS CATHETER  03/31/2012   Procedure: INSERTION OF DIALYSIS CATHETER;  Surgeon: Angelia Mould, MD;  Location: [facility] OR;  Service: Vascular;  Laterality: N/A;  insertion of dialysis catheter right internal jugular vein  . 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 ORIF TIBIA FRACTURE Left 12/23/2018   Procedure: OPEN REDUCTION INTERNAL FIXATION (ORIF) TIBIA FRACTURE;  Surgeon: Shona Needles, MD;  Location:  [facility];  Service: Orthopedics;  Laterality: Left;  . ORIF TIBIA PLATEAU Right 12/23/2018   Procedure: OPEN REDUCTION INTERNAL FIXATION (ORIF) TIBIAL PLATEAU;  Surgeon: Shona Needles, MD;  Location: [facility];  Service: Orthopedics;  Laterality: Right;  . ORIF WRIST FRACTURE Left 12/08/2019   Procedure: OPEN REDUCTION INTERNAL FIXATION (ORIF) WRIST FRACTURE, REPAIR LACERATION LEFT FOREARM;  Surgeon: Dorna Leitz, MD;  Location: [facility];  Service: Orthopedics;  Laterality: Left;  . TONSILLECTOMY  1967  . TOTAL HIP ARTHROPLASTY Left 12/08/2019   Procedure: TOTAL HIP ARTHROPLASTY ANTERIOR APPROACH;  Surgeon: Dorna Leitz, MD;  Location: [facility];  Service: Orthopedics;  Laterality: Left;  . TUBAL LIGATION  2010     OB History   No obstetric history on file.     Family History  Problem Relation Age of Onset  . Hypertension Mother   . Heart disease Father        CABG history  . Polycystic kidney disease Father   . Polycystic kidney disease Brother     Social History   Tobacco Use  . Smoking status: Former Smoker    Packs/day: 0.25    Years: 15.00    Pack years: 3.75    Types: Cigarettes    Quit date: 03/22/1998    Years since quitting: 22.0  . Smokeless tobacco: Never Used  Substance Use Topics  . Alcohol use: No    Comment: social  . Drug use: No    Home Medications Prior to Admission medications   Medication Sig Start Date End Date Taking? Authorizing Provider  acetaminophen (TYLENOL) 325 MG tablet Take 650 mg by mouth every 6 (six) hours as needed for mild pain or headache.    [provider]  albuterol (VENTOLIN HFA) 108 (90 Base) MCG/ACT inhaler Inhale 2 puffs into the lungs every 4 (four) hours as needed for wheezing or shortness of breath. 01/04/20   Angiulli, Lavon Paganini, PA-C  allopurinol (ZYLOPRIM) 300 MG tablet Take 1 tablet (300 mg total) by mouth daily. 01/04/20   Angiulli, Lavon Paganini, PA-C  aspirin 81 MG chewable tablet Chew 81 mg by mouth daily.    [provider]  brimonidine (ALPHAGAN P) 0.1 % SOLN Place 1 drop 2 (two) times daily into the left eye.    [provider]  cholecalciferol (VITAMIN D3) 25 MCG (1000 UNIT) tablet Take 1 tablet (1,000 Units total) by mouth daily. 01/04/20   Angiulli, Lavon Paganini, PA-C  clonazePAM (KLONOPIN) 0.25 MG disintegrating tablet Take 1 tablet (0.25 mg total) by mouth 2 (two) times daily as needed (Anxiety). 03/18/20   Arrien, Jimmy Picket, MD  cycloSPORINE (RESTASIS) 0.05 % ophthalmic emulsion Place 1 drop into both eyes 2 (two) times daily. 01/04/20   Angiulli, Lavon Paganini, PA-C  docusate sodium (COLACE) 100 MG capsule Take 1 capsule (100 mg total) by mouth 2 (two) times daily. Patient taking differently: Take 100 mg by mouth 2 (two) times daily as needed for mild constipation.  12/13/19   Shelly Coss, MD  dorzolamide (TRUSOPT) 2 % ophthalmic solution Place 1 drop into  both eyes daily. 06/25/19   [provider]  feeding supplement, ENSURE ENLIVE, (ENSURE ENLIVE) LIQD Take 237 mLs by mouth 2 (two) times daily between meals. 02/23/20   Alma Friendly, MD  FLUoxetine (PROZAC) 40 MG capsule Take 1 capsule (40 mg total) by mouth daily. 01/04/20   Angiulli, Lavon Paganini, PA-C  furosemide (LASIX) 40 MG tablet Take 1 tablet (40 mg total) by mouth daily. 02/07/20   Raiford Noble Latif, DO  gabapentin (NEURONTIN) 100 MG capsule Take 200 mg by mouth 3 (three) times daily.    [provider]  guaiFENesin (MUCINEX) 600 MG 12 hr tablet Take 1 tablet (600 mg total) by mouth 2 (two) times daily. 02/06/20   Sheikh, Omair Latif, DO  ipratropium-albuterol (DUONEB) 0.5-2.5 (3) MG/3ML SOLN Take 3 mLs by nebulization 2 (two) times daily. 03/18/20 04/17/20  Arrien, Jimmy Picket, MD  magnesium oxide (MAG-OX) 400 MG tablet Take 1 tablet (400 mg total) by mouth 2 (two) times daily. 01/04/20   Angiulli, Lavon Paganini, PA-C  methocarbamol (ROBAXIN) 500 MG tablet Take 1 tablet (500 mg total) by mouth 4 (four) times daily.  01/04/20   Angiulli, Lavon Paganini, PA-C  Multiple Vitamin (MULTIVITAMIN WITH MINERALS) TABS tablet Take 1 tablet by mouth daily. 02/24/20   Alma Friendly, MD  mycophenolate (MYFORTIC) 180 MG EC tablet Take 180 mg by mouth 2 (two) times daily.    [provider]  nystatin (MYCOSTATIN) 100000 UNIT/ML suspension Use as directed 5 mLs (500,000 Units total) in the mouth or throat 4 (four) times daily. 02/23/20   Alma Friendly, MD  pantoprazole (PROTONIX) 40 MG tablet Take 1 tablet (40 mg total) by mouth 2 (two) times daily. 01/04/20   Angiulli, Lavon Paganini, PA-C  polyethylene glycol (MIRALAX / GLYCOLAX) 17 g packet Take 17 g by mouth daily as needed for mild constipation. 12/13/19   Shelly Coss, MD  predniSONE (DELTASONE) 5 MG tablet Take 1.5 tablets (7.5 mg total) by mouth daily with breakfast. 03/18/20   Arrien, Jimmy Picket, MD  PROGRAF 1 MG capsule Take 1 mg by mouth daily. 03/22/19   [provider]  saccharomyces boulardii (FLORASTOR) 250 MG capsule Take 1 capsule (250 mg total) by mouth 2 (two) times daily. 02/06/20   Raiford Noble Latif, DO  sodium chloride (OCEAN) 0.65 % SOLN nasal spray Place 1 spray into both nostrils as needed for congestion. 02/06/20   Sheikh, Omair Latif, DO  SYMBICORT 160-4.5 MCG/ACT inhaler Inhale 2 puffs into the lungs 2 (two) times daily. 01/04/20   Angiulli, Lavon Paganini, PA-C  tacrolimus (PROGRAF) 0.5 MG capsule Take 0.5 mg by mouth at bedtime. Takes brand name Prograf    [provider]  TRAVATAN Z 0.004 % SOLN ophthalmic solution Place 1 drop into both eyes at bedtime. 07/03/16   [provider]  valACYclovir (VALTREX) 500 MG tablet Take 500 mg by mouth 2 (two) times daily.    [provider]  verapamil (CALAN-SR) 240 MG CR tablet Take 1 tablet (240 mg total) by mouth daily. 03/19/20 04/18/20  Arrien, Jimmy Picket, MD  vitamin B-12 (CYANOCOBALAMIN) 100 MCG tablet Take 100 mcg by mouth daily.    [provider]     Allergies    Ardyth Harps [iron dextran], Pentamidine, Erythromycin [erythromycin], Iohexol, Oxycodone, Erythromycin, and Ultram [tramadol hcl]  Review of Systems   Review of Systems  Constitutional: Positive for fatigue. Negative for fever.  Respiratory: Positive for shortness of breath.   Cardiovascular: Positive for chest  pain.  Gastrointestinal: Positive for nausea.  Musculoskeletal: Negative for back pain.  Neurological: Negative for headaches.  All other systems reviewed and are negative.   Physical Exam Updated Vital Signs BP (!) 152/67   Pulse (!) 103   Temp 98.8 F (37.1 C)   Resp (!) 31   Ht 1.549 m (5\' 1" )   Wt 73.9 kg   SpO2 100%   BMI 30.80 kg/m   Physical Exam CONSTITUTIONAL: Ill-appearing HEAD: Normocephalic/atraumatic EYES: EOMI/PERRL ENMT: Mucous membranes moist NECK: supple no meningeal signs SPINE/BACK:entire spine nontender CV: S1/S2 noted, no murmurs/rubs/gallops noted LUNGS: Tachypnea, crackles bilaterally in the bases ABDOMEN: soft, nontender, no rebound or guarding, bowel sounds noted throughout abdomen GU:no cva tenderness NEURO: Pt is awake/alert/appropriate, moves all extremitiesx4.  No facial droop.  EXTREMITIES: pulses normal/equal, full ROM, no lower extremity edema SKIN: warm, color normal PSYCH: Unable to assess ED Results / Procedures / Treatments   Labs (all labs ordered are listed, but only abnormal results are displayed) Labs Reviewed  CBC - Abnormal; Notable for the following components:      Result Value   RBC 2.48 (*)    Hemoglobin 7.1 (*)    HCT 24.6 (*)    MCHC 28.9 (*)    RDW 24.1 (*)    All other components within normal limits  BRAIN NATRIURETIC PEPTIDE - Abnormal; Notable for the following components:   B Natriuretic Peptide 494.5 (*)    All other components within normal limits  POCT I-STAT 7, (LYTES, BLD GAS, ICA,H+H) - Abnormal; Notable for the following components:   pH, Arterial 7.486 (*)    pO2, Arterial 214  (*)    Bicarbonate 31.4 (*)    TCO2 33 (*)    Acid-Base Excess 7.0 (*)    HCT 28.0 (*)    Hemoglobin 9.5 (*)    All other components within normal limits  SARS CORONAVIRUS 2 BY RT PCR (HOSPITAL ORDER, [facility])  BASIC METABOLIC PANEL  TROPONIN I (HIGH SENSITIVITY)  TROPONIN I (HIGH SENSITIVITY)    EKG ED ECG REPORT   Date: 04/07/2020 0444  Rate: 103  Rhythm: sinus tachycardia  QRS Axis: normal  Intervals: normal  ST/T Wave abnormalities: nonspecific ST changes  Conduction Disutrbances:none   I have personally reviewed the EKG tracing and agree with the computerized printout as noted.  Radiology DG Chest 2 View  Result Date: 04/07/2020 CLINICAL DATA:  Chest pain, lethargy. EXAM: CHEST - 2 VIEW COMPARISON:  Chest x-rays dated 03/14/2020 and 02/29/2020. FINDINGS: Stable mild cardiomegaly. No confluent opacity to suggest consolidating pneumonia. No pleural effusion or pneumothorax is seen. Osseous structures about the chest are unremarkable. IMPRESSION: No active cardiopulmonary disease. No evidence of pneumonia or pulmonary edema. Electronically Signed   By: Franki Cabot M.D.   On: 04/07/2020 05:28    Procedures .Critical Care Performed by: Ripley Fraise, MD Authorized by: Ripley Fraise, MD   Critical care provider statement:    Critical care time (minutes):  40   Critical care start time:  04/07/2020 6:05 AM   Critical care end time:  04/07/2020 6:45 AM   Critical care time was exclusive of:  Separately billable procedures and treating other patients   Critical care was necessary to treat or prevent imminent or life-threatening deterioration of the following conditions:  Cardiac failure and respiratory failure   Critical care was time spent personally by me on the following activities:  Discussions with consultants, development of treatment plan with  patient or surrogate, evaluation of patient's response to treatment, examination of patient,  pulse oximetry, re-evaluation of patient's condition, ordering and review of radiographic studies, ordering and review of laboratory studies and ordering and performing treatments and interventions     Medications Ordered in ED Medications  sodium chloride flush (NS) 0.9 % injection 3 mL (has no administration in time range)  ipratropium (ATROVENT) nebulizer solution 0.5 mg (0.5 mg Nebulization Not Given 04/07/20 0546)  predniSONE (DELTASONE) tablet 60 mg (has no administration in time range)  albuterol (PROVENTIL) (2.5 MG/3ML) 0.083% nebulizer solution 5 mg (2.5 mg Nebulization Given 04/07/20 0552)  ipratropium-albuterol (DUONEB) 0.5-2.5 (3) MG/3ML nebulizer solution (3 mLs  Given 04/07/20 0545)  albuterol (PROVENTIL,VENTOLIN) solution continuous neb (10 mg/hr Nebulization Given 04/07/20 9432)    ED Course  I have reviewed the triage vital signs and the nursing notes.  Pertinent labs & imaging results that were available during my care of the patient were reviewed by me and considered in my medical decision making (see chart for details).    MDM Rules/Calculators/A&P                      6:07 AM Patient initially presented for chest pain/shortness of breath.  However after returning from chest x-ray, patient began to worsen.  She reports the pain is improved, but now is reporting increasing shortness of breath and fatigue.  Patient is ill-appearing.  She has mostly crackles on exam, but x-ray is read out as negative.  I personally reviewed the chest x-ray.  Patient does have a history of COPD.  I suspicious that she is having worsening respiratory failure.  Albuterol and Atrovent have been ordered.  Labs thus far reassuring including metabolic panel and negative troponin.  No acute EKG changes.  No ischemic changes on EKG Patient appears to have acute on chronic anemia which may be contributing to her symptoms 7:14 AM Patient appears to be improving after nebulized treatment. Patient  currently undergoing continuous albuterol treatment.  Her chest pain is resolved. I believe her main issue is a COPD exacerbation. Plan at signout to Dr. Sherry Ruffing is to reassess after neb treatment.  Final Clinical Impression(s) / ED Diagnoses Final diagnoses:  COPD exacerbation [facility])    Rx / DC Orders ED Discharge Orders    None       Ripley Fraise, MD 04/07/20 [phone]

## 2020-04-07 NOTE — ED Triage Notes (Addendum)
Pt states that chest pain started about 12 hours ago. She states she hasn't been able to eat or drink anything much lately. She describes the pain as a tightness in the center of her chest that worsens when she breathes. She comes from a rehab facility where she has been staying due to a fall. Prior to EMS arrival she took 2 Nitros. EMS gave 324 mg of  ASA and 4 mg of Zofran due to nausea the pt was experiencing. Pt is on 2L of home O2.

## 2020-04-08 DIAGNOSIS — J441 Chronic obstructive pulmonary disease with (acute) exacerbation: Secondary | ICD-10-CM | POA: Diagnosis not present

## 2020-04-08 DIAGNOSIS — L89309 Pressure ulcer of unspecified buttock, unspecified stage: Secondary | ICD-10-CM | POA: Insufficient documentation

## 2020-04-08 LAB — CBC
HCT: 25.4 % — ABNORMAL LOW (ref 36.0–46.0)
Hemoglobin: 7.4 g/dL — ABNORMAL LOW (ref 12.0–15.0)
MCH: 28.7 pg (ref 26.0–34.0)
MCHC: 29.1 g/dL — ABNORMAL LOW (ref 30.0–36.0)
MCV: 98.4 fL (ref 80.0–100.0)
Platelets: 289 10*3/uL (ref 150–400)
RBC: 2.58 MIL/uL — ABNORMAL LOW (ref 3.87–5.11)
RDW: 23.8 % — ABNORMAL HIGH (ref 11.5–15.5)
WBC: 9.2 10*3/uL (ref 4.0–10.5)
nRBC: 0 % (ref 0.0–0.2)

## 2020-04-08 LAB — GLUCOSE, CAPILLARY
Glucose-Capillary: 49 mg/dL — ABNORMAL LOW (ref 70–99)
Glucose-Capillary: 57 mg/dL — ABNORMAL LOW (ref 70–99)
Glucose-Capillary: 83 mg/dL (ref 70–99)
Glucose-Capillary: 88 mg/dL (ref 70–99)
Glucose-Capillary: 323 mg/dL — ABNORMAL HIGH (ref 70–99)

## 2020-04-08 LAB — BASIC METABOLIC PANEL
Anion gap: 9 (ref 5–15)
BUN: 33 mg/dL — ABNORMAL HIGH (ref 8–23)
CO2: 27 mmol/L (ref 22–32)
Calcium: 9.7 mg/dL (ref 8.9–10.3)
Chloride: 100 mmol/L (ref 98–111)
Creatinine, Ser: 0.9 mg/dL (ref 0.44–1.00)
GFR calc Af Amer: >60 mL/min (ref >60)
GFR calc non Af Amer: >60 mL/min (ref >60)
Glucose, Bld: 212 mg/dL — ABNORMAL HIGH (ref 70–99)
Potassium: 4.3 mmol/L (ref 3.5–5.1)
Sodium: 136 mmol/L (ref 135–145)

## 2020-04-08 LAB — MAGNESIUM: Magnesium: 2.2 mg/dL (ref 1.7–2.4)

## 2020-04-08 MED ORDER — DOXYCYCLINE HYCLATE 100 MG PO CAPS: 100.0000 mg | ORAL_CAPSULE | Freq: Two times a day (BID) | ORAL | Status: AC

## 2020-04-08 MED ORDER — IPRATROPIUM-ALBUTEROL 0.5-2.5 (3) MG/3ML IN SOLN
3.0000 mL | Freq: Two times a day (BID) | RESPIRATORY_TRACT | Status: DC
  Administered 2020-04-08: 3 mL via RESPIRATORY_TRACT
  Filled 2020-04-08: qty 3

## 2020-04-08 MED ORDER — ALUM & MAG HYDROXIDE-SIMETH 200-200-20 MG/5ML PO SUSP
30.0000 mL | ORAL | Status: DC | PRN
  Administered 2020-04-08: 30 mL via ORAL
  Filled 2020-04-08: qty 30

## 2020-04-08 NOTE — Social Work (Addendum)
3:35p CSW confirmed SNF received DC Summary. SNF [facility] [place] requested to follow-up with CSW once it is reviewed to confirm transport can be scheduled.  4:25p CSW contact [facility] to follow-up, left a voicemail and awaiting a call back  5:00p CSW again contacted SNF [phone]. CSW was unable to make any contact with the facility. CSW updated RN & MD of delay from SNF.    [employer], [place].

## 2020-04-08 NOTE — Evaluation (Signed)
Physical Therapy Evaluation Patient Details Name: April [name] MRN: [id number] DOB: May 20, 1956 Today's Date: 04/08/2020   History of Present Illness  April [name] is a 64 y.o. female with PMh including COPD, chronic O2 dependent respiratory failure, diastolic CHF, hyperlipidemia, polycystic kidney disease s/p renal transplant on immunosuppressive therapy, frequent falls (ORIF BLE 12/23/2018), THA 12/24/2019, ORIF L wrst 12/08/2019) and NSVT's who presents with complaints of SOB and chest pain. Of note: She has been admitted in the hospital 4 times in the last 2 months with COPD exacerbations and concerns for infection, after the last admission she went to SNF for rehab (then presented here at hospital 2 days after being home)  Clinical Impression  Pt admitted with above diagnosis. Pt motivated to improve mobility and be able to return home after stay at [facility].  Today she required min-mod A for transfers/ambulation and fatigued very easily.  Her VSS on 2 LPM O2.  Required skilled therapy to provided cues for sequencing, safety, and breathing technique today.  Pt with good rehab potential. Pt currently with functional limitations due to the deficits listed below (see PT Problem List). Pt will benefit from skilled PT to increase their independence and safety with mobility to allow discharge to the venue listed below.       Follow Up Recommendations SNF    Equipment Recommendations  None recommended by PT    Recommendations for Other Services       Precautions / Restrictions Precautions Precautions: Fall Restrictions Weight Bearing Restrictions: No      Mobility  Bed Mobility Overal bed mobility: Needs Assistance Bed Mobility: Supine to Sit     Supine to sit: Min assist     General bed mobility comments: On BSC with OT upon arrival  Transfers Overall transfer level: Needs assistance Equipment used: 1 person hand held assist;Rolling walker (2 wheeled) Transfers: Sit to/from  Stand Sit to Stand: Mod assist;From elevated surface Stand pivot transfers: Mod assist;From elevated surface       General transfer comment: elevated bed, face to face transfer, mod A for boost, and assist with weight shift to [facility]  Ambulation/Gait Ambulation/Gait assistance: Min assist Gait Distance (Feet): 2 Feet Assistive device: Rolling walker (2 wheeled) Gait Pattern/deviations: Step-to pattern;Shuffle Gait velocity: decreased   General Gait Details: side steps from [facility] to recliner; fatigued very easily  Stairs            Wheelchair Mobility    Modified Rankin (Stroke Patients Only)       Balance Overall balance assessment: Needs assistance;History of Falls Sitting-balance support: Feet supported;Bilateral upper extremity supported Sitting balance-Leahy Scale: Fair     Standing balance support: Bilateral upper extremity supported Standing balance-Leahy Scale: Poor Standing balance comment: required RW                             Pertinent Vitals/Pain Pain Assessment: No/denies pain Pain Intervention(s): Monitored during session;Repositioned    Home Living Family/patient expects to be discharged to:: Skilled nursing facility                      Prior Function Level of Independence: Needs assistance   Gait / Transfers Assistance Needed: Pt working with PT/OT at SNF was walking 10 ft with RW  ADL's / Homemaking Assistance Needed: Pt reports needing assist for LB bathing, dressing self, and pericare.; been using the bed pan  Comments: Pt at SNF for rehab  due to recent hospital admissions.  Per prior notes was independent with RW prior to recent admission     Hand Dominance   Dominant Hand: Right    Extremity/Trunk Assessment   Upper Extremity Assessment Upper Extremity Assessment: Defer to OT evaluation    Lower Extremity Assessment Lower Extremity Assessment: Generalized weakness    Cervical / Trunk Assessment Cervical /  Trunk Assessment: Normal  Communication   Communication: No difficulties  Cognition Arousal/Alertness: Awake/alert Behavior During Therapy: WFL for tasks assessed/performed Overall Cognitive Status: Within Functional Limits for tasks assessed                                 General Comments: Pt motivated and willing to participate in therapy.      General Comments General comments (skin integrity, edema, etc.): On 2 L O2 with SpO2 >93% throughout session;  HR 84-105 throughout session Discussed trying to increase daily activity as able (up to [facility] instead of bed pan) - pt motivated to do so   Exercises     Assessment/Plan    PT Assessment Patient needs continued PT services  PT Problem List Decreased strength;Decreased activity tolerance;Decreased balance;Decreased mobility;Decreased knowledge of use of DME;Pain;Decreased coordination;Cardiopulmonary status limiting activity       PT Treatment Interventions DME instruction;Gait training;Functional mobility training;Therapeutic activities;Therapeutic exercise;Balance training;Neuromuscular re-education;Patient/family education;Wheelchair mobility training    PT Goals (Current goals can be found in the Care Plan section)  Acute Rehab PT Goals Patient Stated Goal: get stronger to be able to go home after SNF PT Goal Formulation: With patient Time For Goal Achievement: 04/22/20 Potential to Achieve Goals: Good    Frequency Min 2X/week   Barriers to discharge        Co-evaluation PT/OT/SLP Co-Evaluation/Treatment: Yes Reason for Co-Treatment: For patient/therapist safety;To address functional/ADL transfers PT goals addressed during session: Mobility/safety with mobility OT goals addressed during session: ADL's and self-care       AM-PAC PT "6 Clicks" Mobility  Outcome Measure Help needed turning from your back to your side while in a flat bed without using bedrails?: A Lot Help needed moving from lying on  your back to sitting on the side of a flat bed without using bedrails?: A Lot Help needed moving to and from a bed to a chair (including a wheelchair)?: A Lot Help needed standing up from a chair using your arms (e.g., wheelchair or bedside chair)?: A Lot Help needed to walk in hospital room?: A Lot Help needed climbing 3-5 steps with a railing? : A Lot 6 Click Score: 12    End of Session   Activity Tolerance: Patient limited by fatigue Patient left: in chair;with call bell/phone within reach Nurse Communication: Mobility status PT Visit Diagnosis: Muscle weakness (generalized) (M62.81);History of falling (Z91.81);Difficulty in walking, not elsewhere classified (R26.2)    Time: 6568-1275 PT Time Calculation (min) (ACUTE ONLY): 23 min   Charges:   PT Evaluation $PT Eval Moderate Complexity: 1 Mod          [name] Font, PT Acute Rehab Services Pager [phone] [facility] Re[facility] [phone] [facility] [phone]   Karlton Lemon 04/08/2020, 11:52 AM

## 2020-04-08 NOTE — Evaluation (Signed)
Occupational Therapy Evaluation and discharge to next venue of care (SNF) Patient Details Name: April [name] MRN: [id number] DOB: 10/07/1956 Today's Date: 04/08/2020    History of Present Illness April [name] is a 64 y.o. female with PMh including COPD, chronic O2 dependent respiratory failure, diastolic CHF, hyperlipidemia, polycystic kidney disease s/p renal transplant on immunosuppressive therapy, frequent falls (ORIF BLE 12/23/2018), THA 12/24/2019, ORIF L wrst 12/08/2019) and NSVT's who presents with complaints of SOB and chest pain. Of note: She has been admitted in the hospital 4 times in the last 2 months with COPD exacerbations and concerns for infection, after the last admission she went to SNF for rehab.   Clinical Impression   Pt from SNF where she was working diligently with PT/OT - walking (approx 10 ft) with RW, and working on bathing/dressing but still needing assist and using the bed pan (due to understaffing?!?). Today Pt was min A for bed mobility, mod A face to face stand pivot transfer to [facility], max A for peri care in standing. Pt is max A for LB dressing and bathing, set up for grooming tasks - although tremor is worse in sitting, and LUE is weaker than right (both with generalized weakness). Educated Pt on self-advocacy while in hospital and SNF and to request HEP/activities she can work on outside of therapy upon return to SNF. OT to defer further therapy to SNF as pt is set to dc today.     Follow Up Recommendations  SNF;Supervision/Assistance - 24 hour    Equipment Recommendations  Other (comment)(defer to next venue of care)    Recommendations for Other Services       Precautions / Restrictions Precautions Precautions: Fall Restrictions Weight Bearing Restrictions: No      Mobility Bed Mobility Overal bed mobility: Needs Assistance Bed Mobility: Supine to Sit     Supine to sit: Min assist     General bed mobility comments: min A for hips EOB with  bed pad  Transfers Overall transfer level: Needs assistance Equipment used: 1 person hand held assist Transfers: Sit to/from [facility] Sit to Stand: Mod assist;From elevated surface Stand pivot transfers: Mod assist;From elevated surface       General transfer comment: elevated bed, face to face transfer, mod A for boost, and assist with weight shift to [facility]    Balance Overall balance assessment: Needs assistance;History of Falls Sitting-balance support: Feet supported;Bilateral upper extremity supported Sitting balance-Leahy Scale: Fair     Standing balance support: Bilateral upper extremity supported Standing balance-Leahy Scale: Poor Standing balance comment: dependent on BUE support                           ADL either performed or assessed with clinical judgement   ADL Overall ADL's : Needs assistance/impaired Eating/Feeding: Set up;Sitting   Grooming: Set up;Sitting Grooming Details (indicate cue type and reason): While seatedin recliner, Bilateral hand tremors increasing difficulty of task.  Upper Body Bathing: Minimal assistance;Sitting   Lower Body Bathing: Maximal assistance   Upper Body Dressing : Sitting;Minimal assistance   Lower Body Dressing: Maximal assistance;Sit to/from stand   Toilet Transfer: Moderate assistance;Stand-pivot;BSC Toilet Transfer Details (indicate cue type and reason): face to face transfer, cues for weight shift Toileting- Clothing Manipulation and Hygiene: Maximal assistance;Sit to/from stand Toileting - Clothing Manipulation Details (indicate cue type and reason): Pt able to maintain standing while OT performed peri care     Functional mobility during  ADLs: Moderate assistance;Rolling walker General ADL Comments: decreased activity tolerance, balance, also educated Pt in self-advocacy while at the SNF     Vision Baseline Vision/History: Wears glasses Wears Glasses: At all times Patient Visual Report:  No change from baseline       Perception     Praxis      Pertinent Vitals/Pain Pain Assessment: No/denies pain Pain Intervention(s): Monitored during session;Repositioned     Hand Dominance Right   Extremity/Trunk Assessment Upper Extremity Assessment Upper Extremity Assessment: Generalized weakness   Lower Extremity Assessment Lower Extremity Assessment: Defer to PT evaluation   Cervical / Trunk Assessment Cervical / Trunk Assessment: Normal   Communication Communication Communication: No difficulties   Cognition Arousal/Alertness: Awake/alert Behavior During Therapy: WFL for tasks assessed/performed Overall Cognitive Status: Within Functional Limits for tasks assessed                                 General Comments: Pt motivated and willing to participate in therapy.   General Comments  2LO2 SpO2 > 93%, HR 84-105 with activity.    Exercises     Shoulder Instructions      Home Living Family/patient expects to be discharged to:: Skilled nursing facility                                        Prior Functioning/Environment Level of Independence: Needs assistance  Gait / Transfers Assistance Needed: Pt working with PT/OT at SNF was walking 10 ft with RW ADL's / Homemaking Assistance Needed: Pt reports needing assist for LB bathing, dressing self, and pericare.; been using the bed pan            OT Problem List: Decreased strength;Decreased activity tolerance;Impaired balance (sitting and/or standing);Decreased coordination;Decreased knowledge of use of DME or AE      OT Treatment/Interventions:      OT Goals(Current goals can be found in the care plan section) Acute Rehab OT Goals Patient Stated Goal: get stronger to be able to go home OT Goal Formulation: With patient Time For Goal Achievement: 04/22/20 Potential to Achieve Goals: Good  OT Frequency:     Barriers to D/C:            Co-evaluation   Reason for  Co-Treatment: For patient/therapist safety;To address functional/ADL transfers PT goals addressed during session: Mobility/safety with mobility OT goals addressed during session: ADL's and self-care      AM-PAC OT "6 Clicks" Daily Activity     Outcome Measure Help from another person eating meals?: A Little Help from another person taking care of personal grooming?: A Little Help from another person toileting, which includes using toliet, bedpan, or urinal?: A Lot Help from another person bathing (including washing, rinsing, drying)?: A Lot Help from another person to put on and taking off regular upper body clothing?: A Little Help from another person to put on and taking off regular lower body clothing?: A Lot 6 Click Score: 15   End of Session Equipment Utilized During Treatment: Gait belt;Oxygen(2L) Nurse Communication: Mobility status(no chair alarm, no purewick)  Activity Tolerance: Patient tolerated treatment well Patient left: in chair;with call bell/phone within reach  OT Visit Diagnosis: Muscle weakness (generalized) (M62.81);History of falling (Z91.81);Unsteadiness on feet (R26.81)                Time:  4199-1444 OT Time Calculation (min): 27 min Charges:  OT General Charges $OT Visit: 1 Visit OT Evaluation $OT Eval Moderate Complexity: [place] OTR/L Acute Rehabilitation Services Pager: [phone] Office: [place] 04/08/2020, 11:44 AM

## 2020-04-08 NOTE — NC FL2 (Signed)
[place] LEVEL OF CARE SCREENING TOOL     IDENTIFICATION  Patient Name: April [name] Birthdate: Apr 03, 1956 Sex: female Admission Date (Current Location): 04/07/2020  [facility] and [place] Number:  Herbalist and Address:  [facility] [place], [place] [address] [place], [place] [address]      Provider Number: [id number]  Attending Physician Name and Address:  Mariel Aloe, MD  Relative Name and Phone Number:  Aviela Blundell    Current Level of Care: Hospital Recommended Level of Care: [place] Prior Approval Number:    Date Approved/Denied:   PASRR Number: [id number]  Discharge Plan: SNF    Current Diagnoses: Patient Active Problem List   Diagnosis Date Noted  . COPD exacerbation ([place]) 04/07/2020  . Acute on chronic respiratory failure with hypoxia ([place]) 03/14/2020  . PNA (pneumonia) 03/13/2020  . SVT (supraventricular tachycardia) ([place])   . Anemia of chronic disease   . Palliative care encounter   . Goals of care, counseling/discussion   . Essential hypertension 02/08/2020  . Glaucoma 02/08/2020  . Sepsis ([place]) 01/31/2020  . Hypoxia   . Shortness of breath   . Fracture of left superior pubic ramus (HCC)   . Pain   . Anxiety state   . Left displaced femoral neck fracture ([place]) 12/15/2019  . Status post total hip replacement, left   . Post-op pain   . Acute blood loss anemia   . Polycystic kidney   . Closed left hip fracture ([place]) 12/07/2019  . Left wrist fracture 12/07/2019  . Medication management 09/22/2019  . Physical deconditioning 09/22/2019  . Hyponatremia 06/29/2019  . Acute respiratory failure with hypoxia ([place]) 04/19/2019  . Acute on chronic diastolic CHF (congestive heart failure) ([place]) 04/19/2019  . Bilateral closed proximal tibial fracture 12/21/2018  . Closed right ankle fracture 12/21/2018  . Fracture 12/21/2018  . Lower urinary tract infectious disease 10/03/2018  . Asthma,  chronic, unspecified asthma severity, with acute exacerbation 10/03/2018  . SIRS (systemic inflammatory response syndrome) ([place]) 10/03/2018  . Nausea vomiting and diarrhea 10/02/2018  . Chronic diastolic CHF (congestive heart failure) ([place]) 10/01/2017  . Influenza B 10/01/2017  . Persistent asthma with status asthmaticus   . Pneumonia 07/15/2016  . Asthma, mild intermittent 07/15/2016  . GERD (gastroesophageal reflux disease) 07/15/2016  . Renal transplant recipient 07/15/2016  . Hyperlipidemia 07/15/2016  . Depression 07/15/2016  . Bronchitis, mucopurulent recurrent ([place]) 08/08/2014  . Chronic cough 07/24/2014  . End stage renal disease ([place]) 03/23/2012  . 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, implant, and graft 03/23/2012    Orientation RESPIRATION BLADDER Height & Weight     Self, Time, Situation, Place  O2(Nasal Cannula) Continent, External catheter Weight: 163 lb 9.3 oz (74.2 kg) Height:  5\' 1"  (154.9 cm)  BEHAVIORAL SYMPTOMS/MOOD NEUROLOGICAL BOWEL NUTRITION STATUS      Continent Diet(See discharge summary)  AMBULATORY STATUS COMMUNICATION OF NEEDS Skin     Verbally Normal                       Personal Care Assistance Level of Assistance  Bathing, Feeding, Dressing Bathing Assistance: Limited assistance Feeding assistance: Independent Dressing Assistance: Limited assistance     Functional Limitations Info  Sight, Hearing, Speech Sight Info: Impaired Hearing Info: Adequate Speech Info: Adequate    SPECIAL CARE FACTORS FREQUENCY  PT (By licensed PT), OT (By licensed OT)     PT Frequency: 5x a week  OT Frequency: 5x a week            Contractures Contractures Info: Not present    Additional Factors Info  Code Status, Allergies Code Status Info: Full Allergies Info: Infed (Iron Dextran), Pentamidine, Erythromycin (Erythromycin), Iohexol, Oxycodone, Erythromycin, Ultram (Tramadol Hcl)           Current Medications (04/08/2020):  This is  the current hospital active medication list Current Facility-Administered Medications  Medication Dose Route Frequency Provider Last Rate Last Admin  . acetaminophen (TYLENOL) tablet 650 mg  650 mg Oral Q6H PRN Fuller Plan A, MD       Or  . acetaminophen (TYLENOL) suppository 650 mg  650 mg Rectal Q6H PRN Fuller Plan A, MD      . allopurinol (ZYLOPRIM) tablet 300 mg  300 mg Oral Daily Tamala Julian, Rondell A, MD   300 mg at 04/08/20 0931  . alum & mag hydroxide-simeth (MAALOX/MYLANTA) 200-200-20 MG/5ML suspension 30 mL  30 mL Oral Q4H PRN Fuller Plan A, MD   30 mL at 04/08/20 0419  . arformoterol (BROVANA) nebulizer solution 15 mcg  15 mcg Nebulization BID Fuller Plan A, MD   15 mcg at 04/08/20 0810  . aspirin chewable tablet 81 mg  81 mg Oral Daily Smith, Rondell A, MD   81 mg at 04/08/20 0930  . brimonidine (ALPHAGAN) 0.15 % ophthalmic solution 1 drop  1 drop Left Eye BID Fuller Plan A, MD   1 drop at 04/08/20 0934  . budesonide (PULMICORT) nebulizer solution 0.25 mg  0.25 mg Nebulization BID Fuller Plan A, MD   0.25 mg at 04/08/20 0810  . cefTRIAXone (ROCEPHIN) 1 g in sodium chloride 0.9 % 100 mL IVPB  1 g Intravenous Q24H Smith, Rondell A, MD 200 mL/hr at 04/08/20 1258 1 g at 04/08/20 1258  . cycloSPORINE (RESTASIS) 0.05 % ophthalmic emulsion 1 drop  1 drop Both Eyes BID Fuller Plan A, MD   1 drop at 04/08/20 0935  . docusate sodium (COLACE) capsule 100 mg  100 mg Oral BID Fuller Plan A, MD   100 mg at 04/08/20 0933  . dorzolamide (TRUSOPT) 2 % ophthalmic solution 1 drop  1 drop Both Eyes Daily Fuller Plan A, MD   1 drop at 04/08/20 0935  . feeding supplement (PRO-STAT SUGAR FREE 64) liquid 30 mL  30 mL Oral BID Smith, Rondell A, MD      . FLUoxetine (PROZAC) capsule 40 mg  40 mg Oral Daily Tamala Julian, Rondell A, MD   40 mg at 04/08/20 0932  . furosemide (LASIX) tablet 40 mg  40 mg Oral Daily Fuller Plan A, MD   40 mg at 04/08/20 0932  . gabapentin (NEURONTIN) capsule 200 mg   200 mg Oral TID Fuller Plan A, MD   200 mg at 04/08/20 0932  . guaiFENesin (MUCINEX) 12 hr tablet 600 mg  600 mg Oral BID Fuller Plan A, MD   600 mg at 04/08/20 0932  . ipratropium (ATROVENT) nebulizer solution 0.5 mg  0.5 mg Nebulization Once Ripley Fraise, MD      . ipratropium-albuterol (DUONEB) 0.5-2.5 (3) MG/3ML nebulizer solution 3 mL  3 mL Nebulization Q6H Smith, Rondell A, MD   3 mL at 04/08/20 0810  . latanoprost (XALATAN) 0.005 % ophthalmic solution 1 drop  1 drop Both Eyes QHS Tamala Julian, Rondell A, MD   1 drop at 04/07/20 2213  . magnesium oxide (MAG-OX) tablet 400 mg  400 mg Oral BID Smith, Rondell A,  MD   400 mg at 04/08/20 1156  . methocarbamol (ROBAXIN) tablet 500 mg  500 mg Oral QID Tamala Julian, Rondell A, MD   500 mg at 04/08/20 1300  . mycophenolate (MYFORTIC) EC tablet 180 mg  180 mg Oral BID Fuller Plan A, MD   180 mg at 04/08/20 0934  . ondansetron (ZOFRAN) injection 4 mg  4 mg Intravenous Q6H PRN Smith, Rondell A, MD      . pantoprazole (PROTONIX) EC tablet 40 mg  40 mg Oral BID Fuller Plan A, MD   40 mg at 04/08/20 0931  . sodium chloride flush (NS) 0.9 % injection 3 mL  3 mL Intravenous Once Ripley Fraise, MD      . sodium chloride flush (NS) 0.9 % injection 3 mL  3 mL Intravenous Q12H Smith, Rondell A, MD   3 mL at 04/08/20 0935  . tacrolimus (PROGRAF) capsule 0.5 mg  0.5 mg Oral QHS Smith, Rondell A, MD   0.5 mg at 04/07/20 2206  . tacrolimus (PROGRAF) capsule 1 mg  1 mg Oral Daily Tamala Julian, Rondell A, MD   1 mg at 04/08/20 0931  . valACYclovir (VALTREX) tablet 500 mg  500 mg Oral BID Fuller Plan A, MD   500 mg at 04/08/20 0933  . verapamil (CALAN-SR) CR tablet 240 mg  240 mg Oral Daily Fuller Plan A, MD   240 mg at 04/08/20 5701     Discharge Medications: Please see discharge summary for a list of discharge medications.  Relevant Imaging Results:  Relevant Lab Results:   Additional Information SSN [id number]  Neysa Hotter [place], [place]

## 2020-04-08 NOTE — Discharge Summary (Signed)
Physician Discharge Summary  April [name] WUJ:[id number] DOB: 1956-09-03 DOA: 04/07/2020  PCP: April Caraway, MD  Admit date: 04/07/2020 Discharge date: 04/08/2020  Admitted From: SNF Disposition: SNF  Recommendations for Outpatient Follow-up:  1. Follow up with PCP in 1 week 2. Please follow up on the following pending results: None  Discharge Condition: Stable CODE STATUS: Full code Diet recommendation: Heart healthy   Brief/Interim Summary:  Admission HPI written by April Morton, MD   HPI: April [name] is a 64 y.o. female with medical history significant of COPD, chronic O2 dependent respiratory failure, diastolic CHF, hyperlipidemia, polycystic kidney disease s/p renal transplant on immunosuppressive therapy, and NSVT's with complaints of shortness of breath and chest pain.  She had been sent to rehab following her last discharge from the hospital on 4/26.  Patient had been doing okay up until 2 days ago when she started to not feel well.  Reports having increased productive cough and substernal tightness/pressure feeling.  Notes associated symptoms of wheezing, nausea, weight loss, intermittent sweats(chronic), and constipation (chronic).  Denies having any fevers, vomiting, diarrhea, leg swelling, or dark stools to her knowledge.  It appears she has been admitted in the hospital 4 times in the last 2 months with COPD exacerbations and concerns for infection.  Records note Klebsiella pneumonia from her last urine culture on 4/21, where she had received antibiotics with adequate coverage.   Hospital course:  COPD exacerbation Mild. Patient started on an increased dose of Prednisone in addition to receiving DuoNebs. Patient with mildly worsened cough with unchanged sputum. Minimal wheezing so steroids reduced back to home dose. Patient was empirically treated with Ceftriaxone which has been changed to doxycycline to complete 5 days of antibiotics. Continue home  nebulizer/inhaler regimen.  Chronic anemia Stable. Fecal occult blood test is negative. Outpatient management.  Chronic diastolic heart failure Stable. Continued Lasix  Hyperglycemia Transient. Patient initially managed on outpatient glipizide and started on SSI.  Hypoglycemia Secondary to above medications. Patient's last hemoglobin A1C was from 2020 and was 5.4%. Discontinued glipizide on discharge.  History of SVT Continue Verapamil  Hypertension Verapamil as above  S/p renal transplant Continue prednisone, mycophenolate and tacrolimus  Anxiety Per medication reconciliation, Klonopin discontinued. Continue fluoxetine  Pressure injury Left buttock, POA  Obesity Body mass index is 30.91 kg/m.  Discharge Diagnoses:  Principal Problem:   COPD exacerbation ([name]) Active Problems:   Renal transplant recipient   Chronic diastolic CHF (congestive heart failure) (HCC)   Anxiety state   Essential hypertension   Anemia of chronic disease   Acute on chronic respiratory failure with hypoxia (HCC)   Pressure injury of skin    Discharge Instructions   Allergies as of 04/08/2020      Reactions   Infed [iron Dextran] Other (See Comments)   Chest tightness   Pentamidine Itching, Shortness Of Breath, Swelling   Erythromycin [erythromycin] Other (See Comments)   Mouth Ulcers   Iohexol Other (See Comments)   Per patient "she has had a kidney transplant and should never have contrast"   Oxycodone Nausea Only, Nausea And Vomiting   Erythromycin Rash   Causes breakout in mouth   Ultram [tramadol Hcl] Anxiety      Medication List    STOP taking these medications   clonazePAM 0.25 MG disintegrating tablet Commonly known as: KLONOPIN   glipiZIDE 10 MG tablet Commonly known as: GLUCOTROL   nystatin 100000 UNIT/ML suspension Commonly known as: MYCOSTATIN   polyethylene glycol 17  g packet Commonly known as: MIRALAX / GLYCOLAX   saccharomyces boulardii 250 MG  capsule Commonly known as: FLORASTOR   sodium chloride 0.65 % Soln nasal spray Commonly known as: OCEAN     TAKE these medications   albuterol 108 (90 Base) MCG/ACT inhaler Commonly known as: VENTOLIN HFA Inhale 2 puffs into the lungs every 4 (four) hours as needed for wheezing or shortness of breath.   allopurinol 300 MG tablet Commonly known as: ZYLOPRIM Take 1 tablet (300 mg total) by mouth daily.   aspirin 81 MG chewable tablet Chew 81 mg by mouth daily.   brimonidine 0.1 % Soln Commonly known as: ALPHAGAN P Place 1 drop 2 (two) times daily into the left eye.   cholecalciferol 25 MCG (1000 UNIT) tablet Commonly known as: VITAMIN D3 Take 1 tablet (1,000 Units total) by mouth daily.   cycloSPORINE 0.05 % ophthalmic emulsion Commonly known as: RESTASIS Place 1 drop into both eyes 2 (two) times daily.   docusate sodium 100 MG capsule Commonly known as: COLACE Take 1 capsule (100 mg total) by mouth 2 (two) times daily.   dorzolamide 2 % ophthalmic solution Commonly known as: TRUSOPT Place 1 drop into both eyes daily.   doxycycline 100 MG capsule Commonly known as: VIBRAMYCIN Take 1 capsule (100 mg total) by mouth 2 (two) times daily for 3 days. Start taking on: Apr 09, 2020   protein supplement Liqd Take 30 mLs by mouth in the morning and at bedtime.   feeding supplement (ENSURE ENLIVE) Liqd Take 237 mLs by mouth 2 (two) times daily between meals.   ferrous sulfate 325 (65 FE) MG tablet Take 325 mg by mouth daily with breakfast.   FLUoxetine 40 MG capsule Commonly known as: PROZAC Take 1 capsule (40 mg total) by mouth daily.   furosemide 40 MG tablet Commonly known as: LASIX Take 1 tablet (40 mg total) by mouth daily.   gabapentin 100 MG capsule Commonly known as: NEURONTIN Take 200 mg by mouth 3 (three) times daily.   guaiFENesin 600 MG 12 hr tablet Commonly known as: MUCINEX Take 1 tablet (600 mg total) by mouth 2 (two) times daily.    ipratropium-albuterol 0.5-2.5 (3) MG/3ML Soln Commonly known as: DUONEB Take 3 mLs by nebulization 2 (two) times daily.   magnesium oxide 400 MG tablet Commonly known as: MAG-OX Take 1 tablet (400 mg total) by mouth 2 (two) times daily.   methocarbamol 500 MG tablet Commonly known as: ROBAXIN Take 1 tablet (500 mg total) by mouth 4 (four) times daily.   multivitamin with minerals Tabs tablet Take 1 tablet by mouth daily.   mycophenolate 180 MG EC tablet Commonly known as: MYFORTIC Take 180 mg by mouth 2 (two) times daily.   pantoprazole 40 MG tablet Commonly known as: PROTONIX Take 1 tablet (40 mg total) by mouth 2 (two) times daily.   predniSONE 5 MG tablet Commonly known as: DELTASONE Take 1.5 tablets (7.5 mg total) by mouth daily with breakfast.   Symbicort 160-4.5 MCG/ACT inhaler Generic drug: budesonide-formoterol Inhale 2 puffs into the lungs 2 (two) times daily.   tacrolimus 0.5 MG capsule Commonly known as: PROGRAF Take 0.5 mg by mouth at bedtime. Takes brand name Prograf   Prograf 1 MG capsule Generic drug: tacrolimus Take 1 mg by mouth daily.   Travatan Z 0.004 % Soln ophthalmic solution Generic drug: Travoprost (BAK Free) Place 1 drop into both eyes at bedtime.   UNABLE TO FIND Take 120 mLs by  mouth in the morning and at bedtime. Med Name: MedPass 2.0   valACYclovir 500 MG tablet Commonly known as: VALTREX Take 500 mg by mouth 2 (two) times daily.   verapamil 240 MG CR tablet Commonly known as: CALAN-SR Take 1 tablet (240 mg total) by mouth daily.   vitamin B-12 100 MCG tablet Commonly known as: CYANOCOBALAMIN Take 100 mcg by mouth daily.       Allergies  Allergen Reactions  . Infed [Iron Dextran] Other (See Comments)    Chest tightness  . Pentamidine Itching, Shortness Of Breath and Swelling  . Erythromycin [Erythromycin] Other (See Comments)    Mouth Ulcers  . Iohexol Other (See Comments)    Per patient "she has had a kidney  transplant and should never have contrast"  . Oxycodone Nausea Only and Nausea And Vomiting  . Erythromycin Rash    Causes breakout in mouth  . Ultram [Tramadol Hcl] Anxiety    Consultations:  None   Procedures/Studies: DG Chest 2 View  Result Date: 04/07/2020 CLINICAL DATA:  Chest pain, lethargy. EXAM: CHEST - 2 VIEW COMPARISON:  Chest x-rays dated 03/14/2020 and 02/29/2020. FINDINGS: Stable mild cardiomegaly. No confluent opacity to suggest consolidating pneumonia. No pleural effusion or pneumothorax is seen. Osseous structures about the chest are unremarkable. IMPRESSION: No active cardiopulmonary disease. No evidence of pneumonia or pulmonary edema. Electronically Signed   By: Franki Cabot M.D.   On: 04/07/2020 05:28   Portable chest 1 View  Result Date: 03/14/2020 CLINICAL DATA:  Pneumonia. EXAM: PORTABLE CHEST 1 VIEW COMPARISON:  03/13/2020.  02/17/2020. FINDINGS: Persistent low lung volumes with bibasilar atelectasis. Similar findings noted on prior exams. No pleural effusion or pneumothorax. Heart size stable. No acute bony sclerotic that bony structures appear stable. IMPRESSION: Persistent low lung volumes with bibasilar atelectasis. Similar findings noted on prior exams. Electronically Signed   By: Marcello Moores  Register   On: 03/14/2020 06:38   DG Chest Port 1 View  Result Date: 03/13/2020 CLINICAL DATA:  Shortness of breath, cough and hypoxia today. Weakness. EXAM: PORTABLE CHEST 1 VIEW COMPARISON:  03/08/2020 FINDINGS: Cardiac silhouette is top-normal in size. No mediastinal or hilar masses. Lung volumes are relatively low. Mild linear opacities noted at the bases consistent with atelectasis. Lungs otherwise clear. No convincing pleural effusion.  No pneumothorax. Skeletal structures are demineralized. There are rib fractures with associated callus formation on the right, lateral seventh and eighth ribs, which were present on the prior study. IMPRESSION: No acute cardiopulmonary  disease. Electronically Signed   By: Lajean Manes M.D.   On: 03/13/2020 11:08   VAS [place] LOWER EXTREMITY VENOUS (DVT)  Result Date: 03/17/2020  Lower Venous DVTStudy Indications: Edema.  Limitations: Poor ultrasound/tissue interface. Comparison Study: 02/02/2020- negative lower extremity venous duplex Performing Technologist: Maudry Mayhew MHA, RDMS, RVT, RDCS  Examination Guidelines: A complete evaluation includes B-mode imaging, spectral Doppler, color Doppler, and power Doppler as needed of all accessible portions of each vessel. Bilateral testing is considered an integral part of a complete examination. Limited examinations for reoccurring indications may be performed as noted. The reflux portion of the exam is performed with the patient in reverse Trendelenburg.  +---------+---------------+---------+-----------+----------+--------------+ RIGHT    CompressibilityPhasicitySpontaneityPropertiesThrombus Aging +---------+---------------+---------+-----------+----------+--------------+ CFV      Full           Yes      Yes                                 +---------+---------------+---------+-----------+----------+--------------+  SFJ      Full                                                        +---------+---------------+---------+-----------+----------+--------------+ FV Prox  Full                                                        +---------+---------------+---------+-----------+----------+--------------+ FV Mid   Full                                                        +---------+---------------+---------+-----------+----------+--------------+ FV DistalFull                                                        +---------+---------------+---------+-----------+----------+--------------+ PFV      Full                                                        +---------+---------------+---------+-----------+----------+--------------+ POP      Full            Yes      Yes                                 +---------+---------------+---------+-----------+----------+--------------+ PTV      Full                                                        +---------+---------------+---------+-----------+----------+--------------+ PERO     Full                                                        +---------+---------------+---------+-----------+----------+--------------+   Right Technical Findings: Not visualized segments include limited visualization PTV/peroneal veins.  +---------+---------------+---------+-----------+----------+--------------+ LEFT     CompressibilityPhasicitySpontaneityPropertiesThrombus Aging +---------+---------------+---------+-----------+----------+--------------+ CFV      Full           Yes      Yes                                 +---------+---------------+---------+-----------+----------+--------------+ SFJ      Full                                                        +---------+---------------+---------+-----------+----------+--------------+  FV Prox  Full                                                        +---------+---------------+---------+-----------+----------+--------------+ FV Mid   Full                                                        +---------+---------------+---------+-----------+----------+--------------+ FV DistalFull                                                        +---------+---------------+---------+-----------+----------+--------------+ PFV      Full                                                        +---------+---------------+---------+-----------+----------+--------------+ POP      Full           Yes      Yes                                 +---------+---------------+---------+-----------+----------+--------------+ PTV      Full                                                         +---------+---------------+---------+-----------+----------+--------------+ PERO     Full                                                        +---------+---------------+---------+-----------+----------+--------------+   Left Technical Findings: Not visualized segments include limited visualization PTV/peroneal veins.   Summary: RIGHT: - There is no evidence of deep vein thrombosis in the lower extremity. However, portions of this examination were limited- see technologist comments above.  - No cystic structure found in the popliteal fossa.  LEFT: - There is no evidence of deep vein thrombosis in the lower extremity. However, portions of this examination were limited- see technologist comments above.  - No cystic structure found in the popliteal fossa.  *See table(s) above for measurements and observations. Electronically signed by Harold Barban MD on 03/17/2020 at 11:42:00 AM.    Final       Subjective: No dyspnea. Feels she has some reflux issues.  Discharge Exam: Vitals:   04/08/20 0745 04/08/20 0812  BP: (!) 112/58   Pulse: 79 81  Resp: 19 18  Temp: (!) 97.5 F (36.4 C)   SpO2: 100% 100%   Vitals:   04/08/20 0403 04/08/20 0500 04/08/20 0745 04/08/20 0812  BP:  116/64  (!) 112/58   Pulse: 78  79 81  Resp: 18  19 18   Temp: 98.3 F (36.8 C)  (!) 97.5 F (36.4 C)   TempSrc: Oral     SpO2: 95%  100% 100%  Weight:  74.2 kg    Height:        General: Pt is alert, awake, not in acute distress Cardiovascular: RRR, S1/S2 +, no rubs, no gallops Respiratory: CTA bilaterally, no wheezing, no rhonchi Abdominal: Soft, NT, ND, bowel sounds + Extremities: no edema, no cyanosis    The results of significant diagnostics from this hospitalization (including imaging, microbiology, ancillary and laboratory) are listed below for reference.     Microbiology: Recent Results (from the past 240 hour(s))  SARS Coronavirus 2 by RT PCR (hospital order, performed in [facility] hospital lab)  Nasopharyngeal Nasopharyngeal Swab     Status: None   Collection Time: 04/07/20  5:48 AM   Specimen: Nasopharyngeal Swab  Result Value Ref Range Status   SARS Coronavirus 2 NEGATIVE NEGATIVE Final    Comment: (NOTE) SARS-CoV-2 target nucleic acids are NOT DETECTED. The SARS-CoV-2 RNA is generally detectable in upper and lower respiratory specimens during the acute phase of infection. The lowest concentration of SARS-CoV-2 viral copies this assay can detect is 250 copies / mL. A negative result does not preclude SARS-CoV-2 infection and should not be used as the sole basis for treatment or other patient management decisions.  A negative result may occur with improper specimen collection / handling, submission of specimen other than nasopharyngeal swab, presence of viral mutation(s) within the areas targeted by this assay, and inadequate number of viral copies (<250 copies / mL). A negative result must be combined with clinical observations, patient history, and epidemiological information. Fact Sheet for Patients:   StrictlyIdeas.no Fact Sheet for Healthcare Providers: BankingDealers.co.za This test is not yet approved or cleared  by the [place] FDA and has been authorized for detection and/or diagnosis of SARS-CoV-2 by FDA under an Emergency Use Authorization (EUA).  This EUA will remain in effect (meaning this test can be used) for the duration of the COVID-19 declaration under Section 564(b)(1) of the Act, 21 U.S.C. section 360bbb-3(b)(1), unless the authorization is terminated or revoked sooner. Performed at [facility], [place] [address], [place], [place] [address]      Labs: BNP (last 3 results) Recent Labs    02/08/20 0436 03/13/20 1044 04/07/20 0541  BNP 459.3* 552.6* 109.3*   Basic Metabolic Panel: Recent Labs  Lab 04/07/20 0500 04/07/20 0608 04/08/20 0203  NA 135 135 136  K 3.9 3.9 4.3  CL 98  --  100   CO2 27  --  27  GLUCOSE 90  --  212*  BUN 21  --  33*  CREATININE 0.72  --  0.90  CALCIUM 9.4  --  9.7  MG  --   --  2.2   Liver Function Tests: No results for input(s): AST, ALT, ALKPHOS, BILITOT, PROT, ALBUMIN in the last 168 hours. No results for input(s): LIPASE, AMYLASE in the last 168 hours. No results for input(s): AMMONIA in the last 168 hours. CBC: Recent Labs  Lab 04/07/20 0500 04/07/20 0608 04/08/20 0203  WBC 9.6  --  9.2  HGB 7.1* 9.5* 7.4*  HCT 24.6* 28.0* 25.4*  MCV 99.2  --  98.4  PLT 285  --  289   Cardiac Enzymes: No results for input(s): CKTOTAL, CKMB, CKMBINDEX, TROPONINI in the  last 168 hours. BNP: Invalid input(s): POCBNP CBG: Recent Labs  Lab 04/07/20 2132 04/08/20 0738 04/08/20 0804 04/08/20 0841 04/08/20 1209  GLUCAP 326* 49* 57* 83 88   D-Dimer No results for input(s): DDIMER in the last 72 hours. Hgb A1c No results for input(s): HGBA1C in the last 72 hours. Lipid Profile No results for input(s): CHOL, HDL, LDLCALC, TRIG, CHOLHDL, LDLDIRECT in the last 72 hours. Thyroid function studies No results for input(s): TSH, T4TOTAL, T3FREE, THYROIDAB in the last 72 hours.  Invalid input(s): FREET3 Anemia work up No results for input(s): VITAMINB12, FOLATE, FERRITIN, TIBC, IRON, RETICCTPCT in the last 72 hours. Urinalysis    Component Value Date/Time   COLORURINE YELLOW 04/07/2020 1520   APPEARANCEUR CLEAR 04/07/2020 1520   LABSPEC 1.013 04/07/2020 1520   PHURINE 5.0 04/07/2020 1520   GLUCOSEU NEGATIVE 04/07/2020 1520   HGBUR SMALL (A) 04/07/2020 1520   BILIRUBINUR NEGATIVE 04/07/2020 [place] 04/07/2020 1520   PROTEINUR 30 (A) 04/07/2020 1520   UROBILINOGEN 0.2 05/15/2013 0733   NITRITE NEGATIVE 04/07/2020 [place] 04/07/2020 1520   Sepsis Labs Invalid input(s): PROCALCITONIN,  WBC,  LACTICIDVEN Microbiology Recent Results (from the past 240 hour(s))  SARS Coronavirus 2 by RT PCR (hospital  order, performed in [place] hospital lab) Nasopharyngeal Nasopharyngeal Swab     Status: None   Collection Time: 04/07/20  5:48 AM   Specimen: Nasopharyngeal Swab  Result Value Ref Range Status   SARS Coronavirus 2 NEGATIVE NEGATIVE Final    Comment: (NOTE) SARS-CoV-2 target nucleic acids are NOT DETECTED. The SARS-CoV-2 RNA is generally detectable in upper and lower respiratory specimens during the acute phase of infection. The lowest concentration of SARS-CoV-2 viral copies this assay can detect is 250 copies / mL. A negative result does not preclude SARS-CoV-2 infection and should not be used as the sole basis for treatment or other patient management decisions.  A negative result may occur with improper specimen collection / handling, submission of specimen other than nasopharyngeal swab, presence of viral mutation(s) within the areas targeted by this assay, and inadequate number of viral copies (<250 copies / mL). A negative result must be combined with clinical observations, patient history, and epidemiological information. Fact Sheet for Patients:   StrictlyIdeas.no Fact Sheet for Healthcare Providers: BankingDealers.co.za This test is not yet approved or cleared  by the [place] FDA and has been authorized for detection and/or diagnosis of SARS-CoV-2 by FDA under an Emergency Use Authorization (EUA).  This EUA will remain in effect (meaning this test can be used) for the duration of the COVID-19 declaration under Section 564(b)(1) of the Act, 21 U.S.C. section 360bbb-3(b)(1), unless the authorization is terminated or revoked sooner. Performed at [facility], [place] [address] [place],  [address]      SIGNED:   Cordelia Poche, MD [facility] 04/08/2020, 1:15 PM

## 2020-04-08 NOTE — TOC Progression Note (Addendum)
Transition of Care [facility]) - Progression Note    Patient Details  Name: April [name] MRN: [id number] Date of Birth: [birth year]  Transition of Care [facility] CM/SW [place], [place] Phone Number: 04/08/2020, 1:56 PM  Clinical Narrative:    1:50: CSW made another call to [name]. CSW was again transferred to [place] voicemail. CSW requested Director of Nursing and left a voicemail, awaiting a call back.  CSW called [name] and informed admissions liaison is currently out of the office. CSW redirected to CSW [place], left a voicemail and awaiting a call back.   Expected Discharge Plan: Skilled Nursing Facility Barriers to Discharge: Continued Medical Work up  Expected Discharge Plan and Services Expected Discharge Plan: [place] In-house Referral: Clinical Social Work                                             Social Determinants of Health (SDOH) Interventions    Readmission Risk Interventions Readmission Risk Prevention Plan 02/09/2020 04/20/2019  Transportation Screening Complete Complete  PCP or Specialist Appt within 3-5 Days - Not Complete  Not Complete comments - not yet ready for d/c  HRI or [place] - Not Complete  HRI or Home Care Consult comments - no needs identified  Social Work Consult for [place] Planning/Counseling - Not Complete  SW consult not completed comments - no needs identified  Palliative Care Screening - Not Applicable  Medication Review (RN Care Manager) Referral to Pharmacy Complete  PCP or Specialist appointment within 3-5 days of discharge Complete -  [place] or Home Care Consult Complete -  [place] Patient Refused -  Some recent data might be hidden

## 2020-04-09 LAB — GLUCOSE, CAPILLARY
Glucose-Capillary: 78 mg/dL (ref 70–99)
Glucose-Capillary: 128 mg/dL — ABNORMAL HIGH (ref 70–99)
Glucose-Capillary: 121 mg/dL — ABNORMAL HIGH (ref 70–99)
Glucose-Capillary: 124 mg/dL — ABNORMAL HIGH (ref 70–99)

## 2020-04-09 IMAGING — DX DG CHEST 2V
2 series · 2 of 2 positions shown · non-contrast
Comparison: Portable chest x-ray October 02, 2018 and August 30, 2018

CLINICAL DATA: Increase shortness of breath over baseline. History
of asthma.

EXAM:
CHEST - 2 VIEW

[chest pa]
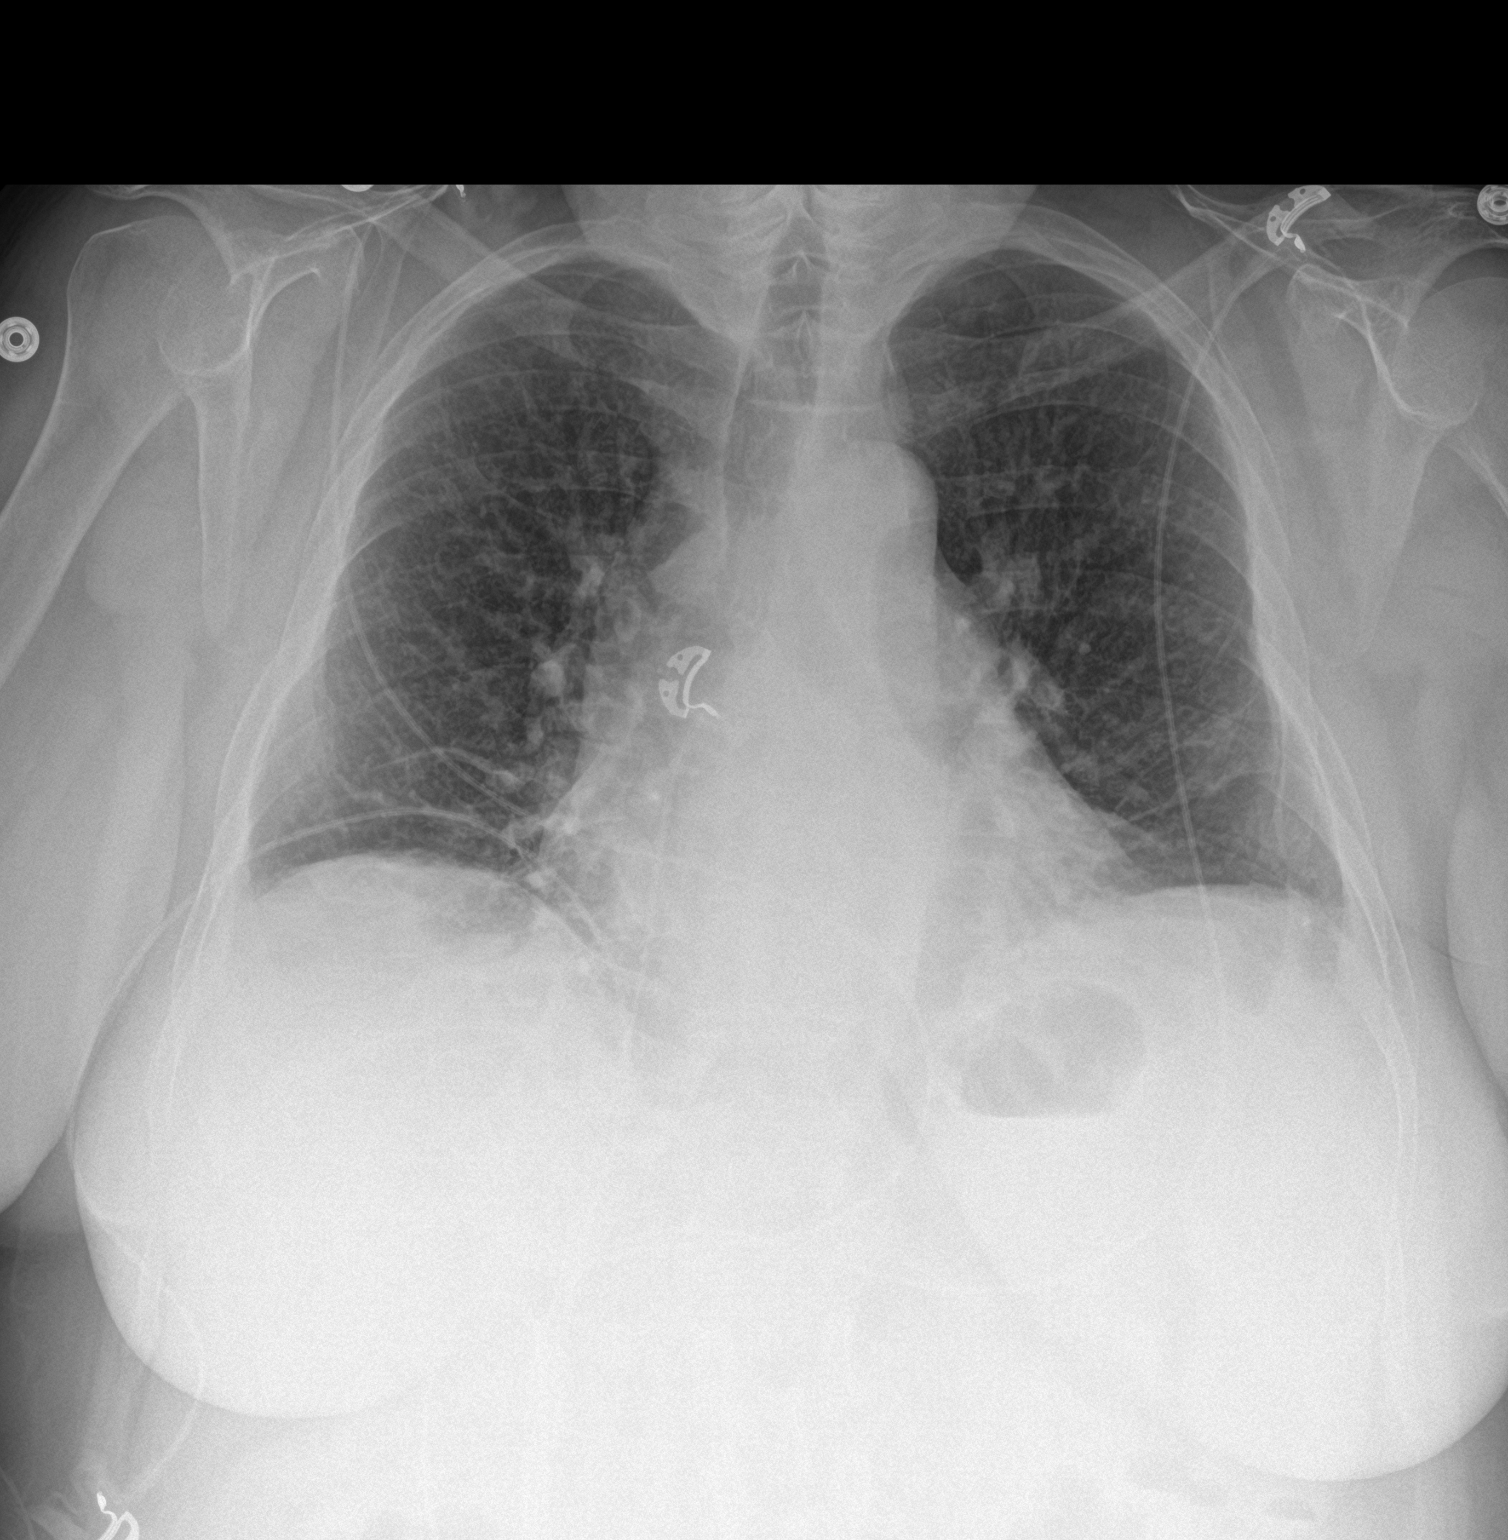

[chest lat]
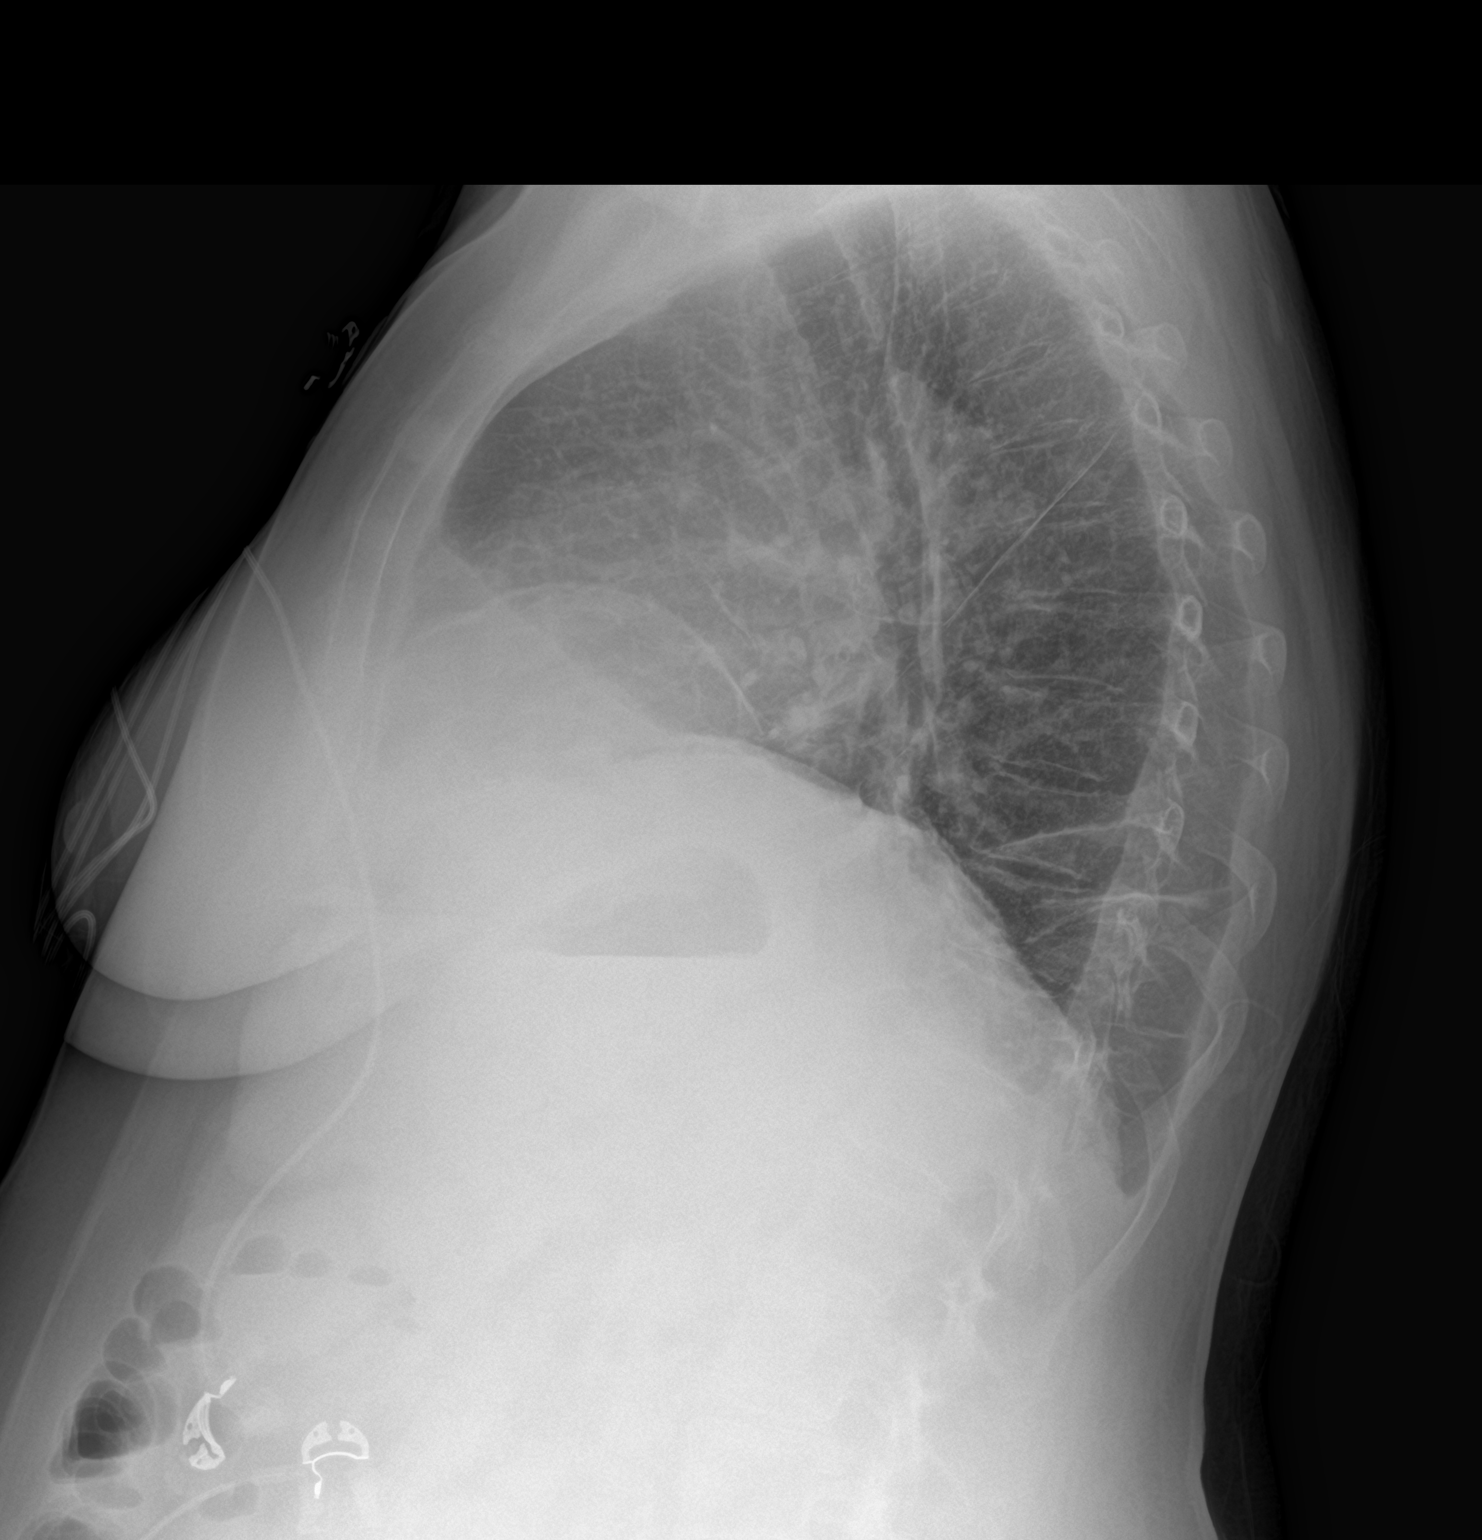

[2 of 2 positions shown; findings below may reference images not displayed]

FINDINGS: The lungs are reasonably well inflated. The interstitial markings
are coarse bilaterally and slightly more conspicuous overall today.
There are linear increased densities at both lung bases which are
also more conspicuous. There are no alveolar infiltrates. There is
no pleural effusion. The heart is normal in size. The pulmonary
vascularity is not engorged.
IMPRESSION: Chronic bronchitic-reactive airway changes. I cannot exclude
superimposed acute bronchitis. There is subsegmental atelectasis at
both lung bases which is slightly more conspicuous today.

## 2020-04-09 MED ORDER — IPRATROPIUM-ALBUTEROL 0.5-2.5 (3) MG/3ML IN SOLN: 3.0000 mL | RESPIRATORY_TRACT | Status: DC | PRN

## 2020-04-09 NOTE — Progress Notes (Signed)
Patient was discharged on 5/17 but unable to leave the hospital secondary to issue with SNF. Patient remains stable for discharge today. She is actually doing better than yesterday from a respiratory standpoint. Noted blood sugar of 323 which patient states was taken shortly after she ate a later dinner. Her blood sugar was 78 this morning. Still recommend no medication regimen for blood sugar control. No hypoglycemia.  Cordelia Poche, MD [facility] 04/09/2020, 9:32 AM

## 2020-04-09 NOTE — TOC Progression Note (Addendum)
Transition of Care [facility]) - Progression Note     Patient Details  Name: BENAY POMEROY MRN: [id number] Date of Birth: Oct 01, 1956  Transition of Care [facility]) CM/SW [place], [place] Phone Number: 04/09/2020, 9:27 AM  Clinical Narrative:     536: CSW called to inquire about transition of pt. No answer, left voicemail requesting return call   928: CSW called SNF again, no answer.   1000: CSW called and was able to reach [place] in admissions. Given direct line [phone]. Informed that pt would need new auth.   CSW called [employer] who confirmed they need new auth. New auth started. Ref # [id number]. Clinicals faxed.   Expected Discharge Plan: Skilled Nursing Facility Barriers to Discharge: No Barriers Identified  Expected Discharge Plan and Services Expected Discharge Plan: [place] In-house Referral: Clinical Social Work       Expected Discharge Date: 04/08/20                                     Social Determinants of Health (SDOH) Interventions    Readmission Risk Interventions Readmission Risk Prevention Plan 02/09/2020 04/20/2019  Transportation Screening Complete Complete  PCP or Specialist Appt within 3-5 Days - Not Complete  Not Complete comments - not yet ready for d/c  HRI or [place] - Not Complete  HRI or Home Care Consult comments - no needs identified  Social Work Consult for [place] Planning/Counseling - Not Complete  SW consult not completed comments - no needs identified  Palliative Care Screening - Not Applicable  Medication Review (RN Care Manager) Referral to Pharmacy Complete  PCP or Specialist appointment within 3-5 days of discharge Complete -  [place] or Home Care Consult Complete -  [place] Patient Refused -  Some recent data might be hidden

## 2020-04-09 NOTE — Progress Notes (Signed)
OT Cancellation Note  Patient Details Name: NELISSA BOLDUC MRN: [id number] DOB: 1956/11/22   Cancelled Treatment:    Reason Eval/Treat Not Completed: Other (comment). OT eval completed on 04/08/20 with further needs deferred to SNF. Patient was discharged on 5/17 but unable to leave the hospital secondary to issue with SNF. Patient remains stable for discharge today  Britt Bottom 04/09/2020, 10:42 AM

## 2020-04-10 DIAGNOSIS — J441 Chronic obstructive pulmonary disease with (acute) exacerbation: Secondary | ICD-10-CM | POA: Diagnosis not present

## 2020-04-10 DIAGNOSIS — J9621 Acute and chronic respiratory failure with hypoxia: Secondary | ICD-10-CM | POA: Diagnosis not present

## 2020-04-10 LAB — GLUCOSE, CAPILLARY
Glucose-Capillary: 108 mg/dL — ABNORMAL HIGH (ref 70–99)
Glucose-Capillary: 120 mg/dL — ABNORMAL HIGH (ref 70–99)
Glucose-Capillary: 122 mg/dL — ABNORMAL HIGH (ref 70–99)
Glucose-Capillary: 168 mg/dL — ABNORMAL HIGH (ref 70–99)

## 2020-04-10 MED ORDER — PREDNISONE 5 MG PO TABS
7.5000 mg | ORAL_TABLET | Freq: Every day | ORAL | Status: DC
  Administered 2020-04-10 – 2020-04-11 (×2): 7.5 mg via ORAL
  Filled 2020-04-10 (×2): qty 2

## 2020-04-10 MED ORDER — PREDNISONE 5 MG PO TABS: 7.5000 mg | ORAL_TABLET | Freq: Every day | ORAL | Status: DC

## 2020-04-10 NOTE — Progress Notes (Signed)
PROGRESS NOTE    April [name]  FIE:[id number] DOB: 1956/01/30 DOA: 04/07/2020 PCP: [name], MD   Chief Complaint  Patient presents with  . Chest Pain    Brief Narrative:   April OKEEFE a 64 y.o.femalewith medical history significant ofCOPD, chronic O2 dependent respiratory failure, diastolic CHF, hyperlipidemia, polycystic kidney diseases/prenal transplant on immunosuppressive therapy, and NSVT's with complaints of shortness of breathand chest pain.She had been sent to rehab following her last discharge from the hospital on 4/26. Patient had been doing okay up until 2 days ago when she started to not feel well. Reports having increased productive cough and substernal tightness/pressure feeling. Notes associated symptoms of wheezing, nausea, weight loss, intermittent sweats(chronic), and constipation (chronic). Denies having any fevers,vomiting, diarrhea, leg swelling, or dark stools to her knowledge. It appears she has been admittedin the hospital4 times in the last 2 months with COPD exacerbationsand concerns for infection.Records note Klebsiella pneumonia from her last urine culture on 4/21, where she had received antibiotics with adequate coverage.  Assessment & Plan:   Principal Problem:   COPD exacerbation ([place]) Active Problems:   Renal transplant recipient   Chronic diastolic CHF (congestive heart failure) (HCC)   Anxiety state   Essential hypertension   Anemia of chronic disease   Acute on chronic respiratory failure with hypoxia (HCC)   Pressure injury of skin  Mild COPD exacerbation Appears to have resolved. Back on home dose of prednisone and on doxycycline to complete the course of antibiotic.   Anemia of chronic disease Hemoglobin is stable. Outpatient follow-up with PCP.   Chronic diastolic heart failure Continue with Lasix.  [name] Hyperglycemia probably secondary to prednisone. Last hemoglobin A1c was 5.4%.    History of  SVT Continue with verapamil.   Essential hypertension Blood pressure parameters are well very controlled.    History of renal transplant Continue with prednisone, mycophenolate and tacrolimus.   Anxiety Continue with fluoxetine.   Pressure injury on the left buttock Pressure Injury 04/07/20 Buttocks Left Stage 2 -  Partial thickness loss of dermis presenting as a shallow open injury with a red, pink wound bed without slough. (Active)  04/07/20 1730  Location: Buttocks  Location Orientation: Left  Staging: Stage 2 -  Partial thickness loss of dermis presenting as a shallow open injury with a red, pink wound bed without slough.  Wound Description (Comments):   Present on Admission: Yes    Present on admission.    Body mass index is 30.91 kg/m.     DVT prophylaxis: scd's Code Status: Full code Family Communication: None at bedside Disposition:   Status is: Observation  The patient remains OBS appropriate and will d/c before 2 midnights.  Dispo: The patient is from: SNF              Anticipated d/c is to: SNF              Anticipated d/c date is: 1 day              Patient currently is medically stable to d/c.       Consultants:   None  Procedures: None Antimicrobials: Doxycycline to complete the course  Subjective: No chest pain or shortness of breath, nausea vomiting or abdominal pain  Objective: Vitals:   04/09/20 2015 04/09/20 2241 04/10/20 0734 04/10/20 0752  BP:  (!) 117/54  128/61  Pulse:  79 80 83  Resp:   20 18  Temp:  98 F (36.7 C)  97.7 F (36.5 C)  TempSrc:  Oral  Oral  SpO2: 98% 98% 97% 100%  Weight:      Height:        Intake/Output Summary (Last 24 hours) at 04/10/2020 1608 Last data filed at 04/10/2020 0424 Gross per 24 hour  Intake 3 ml  Output 1400 ml  Net -1397 ml   Filed Weights   04/07/20 0438 04/08/20 0500  Weight: 73.9 kg 74.2 kg    Examination:  General exam: Appears calm and comfortable  Respiratory  system: Diminished at bases and scattered wheezing posteriorly. Cardiovascular system: S1 & S2 heard, RRR. [name] No pedal edema. Gastrointestinal system: Abdomen is nondistended, soft and nontender.. Normal bowel sounds heard. Central nervous system: Alert and oriented. No focal neurological deficits. Extremities: Symmetric 5 x 5 power. Skin: Stage II left buttock pressure injury Psychiatry: Mood & affect appropriate.     Data Reviewed: I have personally reviewed following labs and imaging studies  CBC: Recent Labs  Lab 04/07/20 0500 04/07/20 0608 04/08/20 0203  WBC 9.6  --  9.2  HGB 7.1* 9.5* 7.4*  HCT 24.6* 28.0* 25.4*  MCV 99.2  --  98.4  PLT 285  --  458    Basic Metabolic Panel: Recent Labs  Lab 04/07/20 0500 04/07/20 0608 04/08/20 0203  NA 135 135 136  K 3.9 3.9 4.3  CL 98  --  100  CO2 27  --  27  GLUCOSE 90  --  212*  BUN 21  --  33*  CREATININE 0.72  --  0.90  CALCIUM 9.4  --  9.7  MG  --   --  2.2    GFR: Estimated Creatinine Clearance: 59.8 mL/min (by C-G formula based on SCr of 0.9 mg/dL).  Liver Function Tests: No results for input(s): AST, ALT, ALKPHOS, BILITOT, PROT, ALBUMIN in the last 168 hours.  CBG: Recent Labs  Lab 04/09/20 1209 04/09/20 1610 04/09/20 2113 04/10/20 0816 04/10/20 1204  GLUCAP 128* 121* 124* 108* 120*     Recent Results (from the past 240 hour(s))  SARS Coronavirus 2 by RT PCR (hospital order, performed in [facility] hospital lab) Nasopharyngeal Nasopharyngeal Swab     Status: None   Collection Time: 04/07/20  5:48 AM   Specimen: Nasopharyngeal Swab  Result Value Ref Range Status   SARS Coronavirus 2 NEGATIVE NEGATIVE Final    Comment: (NOTE) SARS-CoV-2 target nucleic acids are NOT DETECTED. The SARS-CoV-2 RNA is generally detectable in upper and lower respiratory specimens during the acute phase of infection. The lowest concentration of SARS-CoV-2 viral copies this assay can detect is 250 copies / mL. A negative  result does not preclude SARS-CoV-2 infection and should not be used as the sole basis for treatment or other patient management decisions.  A negative result may occur with improper specimen collection / handling, submission of specimen other than nasopharyngeal swab, presence of viral mutation(s) within the areas targeted by this assay, and inadequate number of viral copies (<250 copies / mL). A negative result must be combined with clinical observations, patient history, and epidemiological information. Fact Sheet for Patients:   StrictlyIdeas.no Fact Sheet for Healthcare Providers: BankingDealers.co.za This test is not yet approved or cleared  by the [place] FDA and has been authorized for detection and/or diagnosis of SARS-CoV-2 by FDA under an Emergency Use Authorization (EUA).  This EUA will remain in effect (meaning this test can be used) for the duration of the COVID-19 declaration under Section 564(b)(1)  of the Act, 21 U.S.C. section 360bbb-3(b)(1), unless the authorization is terminated or revoked sooner. Performed at [facility], [place] [address] [place], [place] [address]          Radiology Studies: No results found.      Scheduled Meds: . allopurinol  300 mg Oral Daily  . arformoterol  15 mcg Nebulization BID  . aspirin  81 mg Oral Daily  . brimonidine  1 drop Left Eye BID  . budesonide (PULMICORT) nebulizer solution  0.25 mg Nebulization BID  . cycloSPORINE  1 drop Both Eyes BID  . docusate sodium  100 mg Oral BID  . dorzolamide  1 drop Both Eyes Daily  . feeding supplement (PRO-STAT SUGAR FREE 64)  30 mL Oral BID  . FLUoxetine  40 mg Oral Daily  . furosemide  40 mg Oral Daily  . gabapentin  200 mg Oral TID  . guaiFENesin  600 mg Oral BID  . ipratropium  0.5 mg Nebulization Once  . latanoprost  1 drop Both Eyes QHS  . magnesium oxide  400 mg Oral BID  . methocarbamol  500 mg Oral QID  .  mycophenolate  180 mg Oral BID  . pantoprazole  40 mg Oral BID  . [START ON 04/11/2020] predniSONE  7.5 mg Oral Q breakfast  . sodium chloride flush  3 mL Intravenous Once  . sodium chloride flush  3 mL Intravenous Q12H  . tacrolimus  0.5 mg Oral QHS  . tacrolimus  1 mg Oral Daily  . valACYclovir  500 mg Oral BID  . verapamil  240 mg Oral Daily   Continuous Infusions: . cefTRIAXone (ROCEPHIN)  IV 1 g (04/10/20 1440)     LOS: 0 days       Hosie Poisson, MD [facility]   To contact the attending provider between 7A-7P or the covering provider during after hours 7P-7A, please log into the web site [email or web address] and access using universal [place] password for that web site. If you do not have the password, please call the hospital operator.  04/10/2020, 4:08 PM

## 2020-04-10 NOTE — Plan of Care (Signed)
  Problem: Education: Goal: Knowledge of General Education information will improve Description: Including pain rating scale, medication(s)/side effects and non-pharmacologic comfort measures 04/10/2020 2253 by Nelia Shi, RN Outcome: Progressing 04/10/2020 2253 by Nelia Shi, RN Outcome: Progressing

## 2020-04-10 NOTE — Plan of Care (Signed)
  Problem: Education: Goal: Knowledge of General Education information will improve Description Including pain rating scale, medication(s)/side effects and non-pharmacologic comfort measures Outcome: Progressing   

## 2020-04-11 DIAGNOSIS — J441 Chronic obstructive pulmonary disease with (acute) exacerbation: Secondary | ICD-10-CM | POA: Diagnosis not present

## 2020-04-11 DIAGNOSIS — I10 Essential (primary) hypertension: Secondary | ICD-10-CM | POA: Diagnosis not present

## 2020-04-11 DIAGNOSIS — D638 Anemia in other chronic diseases classified elsewhere: Secondary | ICD-10-CM | POA: Diagnosis not present

## 2020-04-11 DIAGNOSIS — J9621 Acute and chronic respiratory failure with hypoxia: Secondary | ICD-10-CM | POA: Diagnosis not present

## 2020-04-11 LAB — GLUCOSE, CAPILLARY
Glucose-Capillary: 96 mg/dL (ref 70–99)
Glucose-Capillary: 139 mg/dL — ABNORMAL HIGH (ref 70–99)
Glucose-Capillary: 115 mg/dL — ABNORMAL HIGH (ref 70–99)

## 2020-04-11 LAB — SARS CORONAVIRUS 2 BY RT PCR (HOSPITAL ORDER, PERFORMED IN ~~LOC~~ HOSPITAL LAB): SARS Coronavirus 2: NEGATIVE

## 2020-04-11 NOTE — TOC Progression Note (Addendum)
Transition of Care [facility]) - Progression Note    Patient Details  Name: April [name] MRN: [id number] Date of Birth: [birth year]  Transition of Care [facility]) CM/SW [place], [place] Phone Number: 04/11/2020, 8:20 AM  Clinical Narrative:     CSW is called by Everlene Balls and informed that auth would go to peer to peer review. CSW notified MD. MD completed peer to peer and said Josem Kaufmann was received but additional PT consult and notes need to be sent.   Expected Discharge Plan: Skilled Nursing Facility Barriers to Discharge: No Barriers Identified  Expected Discharge Plan and Services Expected Discharge Plan: [place] In-house Referral: Clinical Social Work       Expected Discharge Date: 04/08/20                                     Social Determinants of Health (SDOH) Interventions    Readmission Risk Interventions Readmission Risk Prevention Plan 02/09/2020 04/20/2019  Transportation Screening Complete Complete  PCP or Specialist Appt within 3-5 Days - Not Complete  Not Complete comments - not yet ready for d/c  HRI or [place] - Not Complete  HRI or Home Care Consult comments - no needs identified  Social Work Consult for [place] Planning/Counseling - Not Complete  SW consult not completed comments - no needs identified  Palliative Care Screening - Not Applicable  Medication Review (RN Care Manager) Referral to Pharmacy Complete  PCP or Specialist appointment within 3-5 days of discharge Complete -  [place] or Home Care Consult Complete -  [place] Patient Refused -  Some recent data might be hidden

## 2020-04-11 NOTE — Progress Notes (Signed)
Physical Therapy Treatment Patient Details Name: April [name] MRN: [id number] DOB: March 03, 1956 Today's Date: 04/11/2020    History of Present Illness April [name] is a 64 y.o. female with PMh including COPD, chronic O2 dependent respiratory failure, diastolic CHF, hyperlipidemia, polycystic kidney disease s/p renal transplant on immunosuppressive therapy, frequent falls (ORIF BLE 12/23/2018), THA 12/24/2019, ORIF L wrst 12/08/2019) and NSVT's who presents with complaints of SOB and chest pain. Of note: She has been admitted in the hospital 4 times in the last 2 months with COPD exacerbations and concerns for infection, after the last admission she went to SNF for rehab (then presented here at hospital 2 days after being home)    PT Comments    Pt very motivated and demonstrating improved transfers with min cues and min guard to min A level of assist.  Pt with excellent rehab potential. All VSS on 2 LPM O2.   Pt expected to d/c to SNF shortly for further rehab.     Follow Up Recommendations  SNF     Equipment Recommendations  None recommended by PT    Recommendations for Other Services       Precautions / Restrictions Precautions Precautions: Fall Restrictions Weight Bearing Restrictions: No LUE Weight Bearing: Weight bearing as tolerated LLE Weight Bearing: Weight bearing as tolerated    Mobility  Bed Mobility Overal bed mobility: Needs Assistance Bed Mobility: Supine to Sit;Sit to Supine     Supine to sit: Min guard Sit to supine: Min assist   General bed mobility comments: increased time; use of bed rail; min A for L leg back into bed  Transfers Overall transfer level: Needs assistance Equipment used: Rolling walker (2 wheeled) Transfers: Sit to/from Stand Sit to Stand: Min assist         General transfer comment: elevated bed; cues for hand placement  Ambulation/Gait Ambulation/Gait assistance: Min assist Gait Distance (Feet): 3 Feet Assistive device:  Rolling walker (2 wheeled) Gait Pattern/deviations: Step-to pattern;Shuffle     General Gait Details: side steps at EOB; limited due to urinary incontinance; fatigued easily   Stairs             Wheelchair Mobility    Modified Rankin (Stroke Patients Only)       Balance Overall balance assessment: Needs assistance;History of Falls Sitting-balance support: No upper extremity supported;Feet supported Sitting balance-Leahy Scale: Good     Standing balance support: Bilateral upper extremity supported Standing balance-Leahy Scale: Fair Standing balance comment: required RW with at least 1 UE                            Cognition Arousal/Alertness: Awake/alert Behavior During Therapy: WFL for tasks assessed/performed Overall Cognitive Status: Within Functional Limits for tasks assessed                                 General Comments: Pt motivated and willing to participate in therapy.      Exercises General Exercises - Lower Extremity Ankle Circles/Pumps: AROM;Both;20 reps;Seated Long Arc Quad: AROM;Both;Seated;10 reps Other Exercises Other Exercises: bridges x 5; pelvic tilts x 10    General Comments General comments (skin integrity, edema, etc.): On 2 L O2 with VSS.   Pt educated on gentle soft tissue mobilization for incision site.  Additionally, discussed some basic core exercises and possible women's/pelvic floor therapy in the future due to incontinence and  core instability that pt reports worsened after pelvic fx.      Pertinent Vitals/Pain Pain Assessment: No/denies pain    Home Living                      Prior Function            PT Goals (current goals can now be found in the care plan section) Acute Rehab PT Goals Patient Stated Goal: get stronger to be able to go home after SNF PT Goal Formulation: With patient Time For Goal Achievement: 04/22/20 Potential to Achieve Goals: Good Progress towards PT goals:  Progressing toward goals    Frequency    Min 2X/week      PT Plan Current plan remains appropriate    Co-evaluation              AM-PAC PT "6 Clicks" Mobility   Outcome Measure  Help needed turning from your back to your side while in a flat bed without using bedrails?: A Little Help needed moving from lying on your back to sitting on the side of a flat bed without using bedrails?: A Little Help needed moving to and from a bed to a chair (including a wheelchair)?: A Little Help needed standing up from a chair using your arms (e.g., wheelchair or bedside chair)?: A Little Help needed to walk in hospital room?: A Little Help needed climbing 3-5 steps with a railing? : A Lot 6 Click Score: 17    End of Session Equipment Utilized During Treatment: Gait belt Activity Tolerance: Patient limited by fatigue Patient left: in chair;in bed;with bed alarm set;with call bell/phone within reach Nurse Communication: Mobility status PT Visit Diagnosis: Muscle weakness (generalized) (M62.81);History of falling (Z91.81);Difficulty in walking, not elsewhere classified (R26.2)     Time: 1520-1550 PT Time Calculation (min) (ACUTE ONLY): 30 min  Charges:  $Therapeutic Exercise: 8-22 mins $Therapeutic Activity: 8-22 mins                     [name] Font, PT Acute Rehab Services Pager [phone] [facility] [phone] [facility] [phone]    Karlton Lemon 04/11/2020, 3:02 PM

## 2020-04-11 NOTE — TOC Transition Note (Signed)
Transition of Care [facility]) - CM/SW Discharge Note   Patient Details  Name: April [name] MRN: [id number] Date of Birth: 04-24-1956  Transition of Care [facility]) CM/SW Contact:  Bethann Berkshire, [place] Phone Number: 04/11/2020, 12:22 PM   Clinical Narrative:     Patient will DC to [place] date: 04/11/20 Family notified: Patient wishes to notify family herself instead of CSW Transport by: [name]   Per MD patient ready for DC to [facility] . RN, patient, and facility notified of DC. Discharge Summary and FL2 sent to facility. RN to call report prior to discharge (Room 1206P [phone]). DC packet on chart. Ambulance transport requested for patient.   CSW will sign off for now as social work intervention is no longer needed. Please consult [place] again if new needs arise.   Final next level of care: Skilled Nursing Facility Barriers to Discharge: No Barriers Identified   Patient Goals and CMS Choice Patient states their goals for this hospitalization and ongoing recovery are:: To [facility] CMS Medicare.gov Compare Post Acute Care list provided to:: Patient Choice offered to / list presented to : Patient  Discharge Placement              Patient [facility] bed at[facility] Patient to be transferred to facility by: [place] Name of family member notified: Pt wishes to contact family herself Patient and family notified of of transfer: 04/11/20  Discharge Plan and Services In-house Referral: Clinical Social Work                                   Social Determinants of Health (SDOH) Interventions     Readmission Risk Interventions Readmission Risk Prevention Plan 02/09/2020 04/20/2019  Transportation Screening Complete Complete  PCP or Specialist Appt within 3-5 Days - Not Complete  Not Complete comments - not yet ready for d/c  HRI or [place] - Not Complete  HRI or Home Care Consult comments - no needs identified  Social Work Consult for  [place] Planning/Counseling - Not Complete  SW consult not completed comments - no needs identified  Palliative Care Screening - Not Applicable  Medication Review Press photographer) Referral to Pharmacy Complete  PCP or Specialist appointment within 3-5 days of discharge Complete -  [place] or Home Care Consult Complete -  [place] Patient Refused -  Some recent data might be hidden

## 2020-04-11 NOTE — TOC Progression Note (Addendum)
Transition of Care [facility]) - Progression Note    Patient Details  Name: April [name] MRN: [id number] Date of Birth: 1956/10/29  Transition of Care [facility]) CM/SW [place], [place] Phone Number: 04/11/2020, 9:50 AM  Clinical Narrative:     CSW met with pt to confirm discharge to SNF. Pt wants to explore other SNF options and provides preference for [place] [facility] [place]. Request sent out.   CSW received contact from [facility] that they could accept. Presented option to pt and she chooses [place]. CSW called [employer] to change auth facility to [place].   Notified [place] that pt chooses different SNF.and won't be returning to [facility].    Expected Discharge Plan: Skilled Nursing Facility Barriers to Discharge: No Barriers Identified  Expected Discharge Plan and Services Expected Discharge Plan: [place] In-house Referral: Clinical Social Work       Expected Discharge Date: 04/08/20                                     Social Determinants of Health (SDOH) Interventions    Readmission Risk Interventions Readmission Risk Prevention Plan 02/09/2020 04/20/2019  Transportation Screening Complete Complete  PCP or Specialist Appt within 3-5 Days - Not Complete  Not Complete comments - not yet ready for d/c  HRI or [place] - Not Complete  HRI or Home Care Consult comments - no needs identified  Social Work Consult for [place] Planning/Counseling - Not Complete  SW consult not completed comments - no needs identified  Palliative Care Screening - Not Applicable  Medication Review (RN Care Manager) Referral to Pharmacy Complete  PCP or Specialist appointment within 3-5 days of discharge Complete -  [place] or Home Care Consult Complete -  [place] Patient Refused -  Some recent data might be hidden

## 2020-04-11 NOTE — Progress Notes (Signed)
Occupational Therapy Treatment Patient Details Name: April [name] MRN: [id number] DOB: 10-Jun-1956 Today's Date: 04/11/2020    History of present illness April [name] is a 64 y.o. female with PMh including COPD, chronic O2 dependent respiratory failure, diastolic CHF, hyperlipidemia, polycystic kidney disease s/p renal transplant on immunosuppressive therapy, frequent falls (ORIF BLE 12/23/2018), THA 12/24/2019, ORIF L wrst 12/08/2019) and NSVT's who presents with complaints of SOB and chest pain. Of note: She has been admitted in the hospital 4 times in the last 2 months with COPD exacerbations and concerns for infection, after the last admission she went to SNF for rehab (then presented here at hospital 2 days after being home)   OT comments  Patient supine in bed and wanting to work with therapy.  She was able to get to EOB with min guard and maintain seated balance without assistance.  Grooming and UB bath completed while seated with supervision.  LB bath while standing.  Patient required max assist as she needed bilateral support from walker and was lacking strength/balance to use one hand to reach.  Able to stand for 30 sec before needing to sit.  Patient improving though activity tolerance is still poor.  Would benefit from further OT to address deficits below.  Follow Up Recommendations  SNF;Supervision/Assistance - 24 hour    Equipment Recommendations  Other (comment)    Recommendations for Other Services      Precautions / Restrictions Precautions Precautions: Fall Restrictions Weight Bearing Restrictions: No       Mobility Bed Mobility Overal bed mobility: Needs Assistance Bed Mobility: Supine to Sit     Supine to sit: Min guard        Transfers Overall transfer level: Needs assistance Equipment used: Rolling walker (2 wheeled) Transfers: Sit to/from Stand Sit to Stand: Mod assist;From elevated surface              Balance Overall balance assessment:  Needs assistance;History of Falls Sitting-balance support: No upper extremity supported;Feet supported Sitting balance-Leahy Scale: Fair     Standing balance support: Bilateral upper extremity supported Standing balance-Leahy Scale: Poor Standing balance comment: required RW                           ADL either performed or assessed with clinical judgement   ADL Overall ADL's : Needs assistance/impaired     Grooming: Set up;Sitting   Upper Body Bathing: Set up;Sitting   Lower Body Bathing: Maximal assistance;Sit to/from stand   Upper Body Dressing : Set up;Sitting                           Vision       Perception     Praxis      Cognition Arousal/Alertness: Awake/alert Behavior During Therapy: WFL for tasks assessed/performed Overall Cognitive Status: Within Functional Limits for tasks assessed                                 General Comments: Pt motivated and willing to participate in therapy.        Exercises     Shoulder Instructions       General Comments On 2L O2, vitals in normal range    Pertinent Vitals/ Pain       Pain Assessment: No/denies pain  Home Living  Prior Functioning/Environment              Frequency  Min 2X/week        Progress Toward Goals  OT Goals(current goals can now be found in the care plan section)  Progress towards OT goals: Progressing toward goals  Acute Rehab OT Goals Patient Stated Goal: get stronger to be able to go home after SNF OT Goal Formulation: With patient Time For Goal Achievement: 04/22/20 Potential to Achieve Goals: Good ADL Goals Pt Will Perform Lower Body Bathing: sit to/from stand;with mod assist Pt Will Perform Lower Body Dressing: with min assist;sit to/from stand Pt Will Transfer to Toilet: with mod assist;stand pivot transfer;bedside commode  Plan Discharge plan remains appropriate     Co-evaluation                 AM-PAC OT "6 Clicks" Daily Activity     Outcome Measure   Help from another person eating meals?: A Little Help from another person taking care of personal grooming?: A Little Help from another person toileting, which includes using toliet, bedpan, or urinal?: A Lot Help from another person bathing (including washing, rinsing, drying)?: A Lot Help from another person to put on and taking off regular upper body clothing?: A Little Help from another person to put on and taking off regular lower body clothing?: A Lot 6 Click Score: 15    End of Session Equipment Utilized During Treatment: Oxygen;Rolling walker  OT Visit Diagnosis: Muscle weakness (generalized) (M62.81);History of falling (Z91.81);Unsteadiness on feet (R26.81)   Activity Tolerance Patient tolerated treatment well   Patient Left in bed;with call bell/phone within reach   Nurse Communication Mobility status        Time: 4742-5956 OT Time Calculation (min): 32 min  Charges: OT General Charges $OT Visit: 1 Visit OT Treatments $Self Care/Home Management : 23-37 mins  August Luz, OTR/L    Phylliss Bob 04/11/2020, 12:45 PM

## 2020-04-11 NOTE — TOC Progression Note (Signed)
Transition of Care [facility]) - Progression Note    Patient Details  Name: April [name] MRN: [id number] Date of Birth: 03-Nov-1956  Transition of [facility]) CM/SW [place], [place] Phone Number: 04/11/2020, 8:22 AM  Clinical Narrative:     Josem Kaufmann received [id number] for [name]. Next review date 04/12/20.   Expected Discharge Plan: Skilled Nursing Facility Barriers to Discharge: No Barriers Identified  Expected Discharge Plan and Services Expected Discharge Plan: [place] In-house Referral: Clinical Social Work       Expected Discharge Date: 04/08/20                                     Social Determinants of Health (SDOH) Interventions    Readmission Risk Interventions Readmission Risk Prevention Plan 02/09/2020 04/20/2019  Transportation Screening Complete Complete  PCP or Specialist Appt within 3-5 Days - Not Complete  Not Complete comments - not yet ready for d/c  HRI or [place] - Not Complete  HRI or Home Care Consult comments - no needs identified  Social Work Consult for [name] - Not Complete  SW consult not completed comments - no needs identified  Palliative Care Screening - Not Applicable  Medication Review (RN Care Manager) Referral to Pharmacy Complete  PCP or Specialist appointment within 3-5 days of discharge Complete -  [name] or Home Care Consult Complete -  [place] Patient Refused -  Some recent data might be hidden

## 2020-04-11 NOTE — Discharge Summary (Signed)
Physician Discharge Summary  April [name] NLG:[id number] DOB: [birth year] DOA: 04/07/2020  PCP: [name], MD  Admit date: 04/07/2020 Discharge date: 04/11/2020  Admitted From: SNF Disposition: SNF  Recommendations for Outpatient Follow-up:  1. Follow up with PCP in 1 week 2. Please follow up on the following pending results: None  Discharge Condition: Stable CODE STATUS: Full code Diet recommendation: Heart healthy   Brief/Interim Summary:  Admission HPI written by Norval Morton, MD   HPI: April [name] is a 64 y.o. female with medical history significant of COPD, chronic O2 dependent respiratory failure, diastolic CHF, hyperlipidemia, polycystic kidney disease s/p renal transplant on immunosuppressive therapy, and NSVT's with complaints of shortness of breath and chest pain.  She had been sent to rehab following her last discharge from the hospital on 4/26.  Patient had been doing okay up until 2 days ago when she started to not feel well.  Reports having increased productive cough and substernal tightness/pressure feeling.  Notes associated symptoms of wheezing, nausea, weight loss, intermittent sweats(chronic), and constipation (chronic).  Denies having any fevers, vomiting, diarrhea, leg swelling, or dark stools to her knowledge.  It appears she has been admitted in the hospital 4 times in the last 2 months with COPD exacerbations and concerns for infection.  Records note Klebsiella pneumonia from her last urine culture on 4/21, where she had received antibiotics with adequate coverage.   Hospital course:  COPD exacerbation Mild. Patient started on an increased dose of Prednisone in addition to receiving DuoNebs. Patient with mildly worsened cough with unchanged sputum. Minimal wheezing so steroids reduced back to home dose. Patient was empirically treated with Ceftriaxone which has been changed to doxycycline to complete 5 days of antibiotics. Continue home  nebulizer/inhaler regimen.  Chronic anemia Stable. Fecal occult blood test is negative. Outpatient management.  Chronic diastolic heart failure Stable. Continued Lasix  Hyperglycemia Transient. Patient initially managed on outpatient glipizide and started on SSI.  Hypoglycemia Secondary to above medications. Patient's last hemoglobin A1C was from 2020 and was 5.4%. Discontinued glipizide on discharge.  History of SVT Continue Verapamil  Hypertension Verapamil as above  S/p renal transplant Continue prednisone, mycophenolate and tacrolimus  Anxiety Per medication reconciliation, Klonopin discontinued. Continue fluoxetine  Pressure injury Left buttock, POA  Obesity Body mass index is 30.91 kg/m.  Discharge Diagnoses:  Principal Problem:   COPD exacerbation ([place]) Active Problems:   Renal transplant recipient   Chronic diastolic CHF (congestive heart failure) (HCC)   Anxiety state   Essential hypertension   Anemia of chronic disease   Acute on chronic respiratory failure with hypoxia (HCC)   Pressure injury of skin    Discharge Instructions  Discharge Instructions    Diet - low sodium heart healthy   Complete by: As directed    Discharge instructions   Complete by: As directed    Please follow up withPCP in one week.     Allergies as of 04/11/2020      Reactions   Infed [iron Dextran] Other (See Comments)   Chest tightness   Pentamidine Itching, Shortness Of Breath, Swelling   Erythromycin [erythromycin] Other (See Comments)   Mouth Ulcers   Iohexol Other (See Comments)   Per patient "she has had a kidney transplant and should never have contrast"   Oxycodone Nausea Only, Nausea And Vomiting   Erythromycin Rash   Causes breakout in mouth   Ultram [tramadol Hcl] Anxiety      Medication List  STOP taking these medications   clonazePAM 0.25 MG disintegrating tablet Commonly known as: KLONOPIN   glipiZIDE 10 MG tablet Commonly known as:  GLUCOTROL   nystatin 100000 UNIT/ML suspension Commonly known as: MYCOSTATIN   polyethylene glycol 17 g packet Commonly known as: MIRALAX / GLYCOLAX   saccharomyces boulardii 250 MG capsule Commonly known as: FLORASTOR   sodium chloride 0.65 % Soln nasal spray Commonly known as: OCEAN     TAKE these medications   albuterol 108 (90 Base) MCG/ACT inhaler Commonly known as: VENTOLIN HFA Inhale 2 puffs into the lungs every 4 (four) hours as needed for wheezing or shortness of breath.   allopurinol 300 MG tablet Commonly known as: ZYLOPRIM Take 1 tablet (300 mg total) by mouth daily.   aspirin 81 MG chewable tablet Chew 81 mg by mouth daily.   brimonidine 0.1 % Soln Commonly known as: ALPHAGAN P Place 1 drop 2 (two) times daily into the left eye.   cholecalciferol 25 MCG (1000 UNIT) tablet Commonly known as: VITAMIN D3 Take 1 tablet (1,000 Units total) by mouth daily.   cycloSPORINE 0.05 % ophthalmic emulsion Commonly known as: RESTASIS Place 1 drop into both eyes 2 (two) times daily.   docusate sodium 100 MG capsule Commonly known as: COLACE Take 1 capsule (100 mg total) by mouth 2 (two) times daily.   dorzolamide 2 % ophthalmic solution Commonly known as: TRUSOPT Place 1 drop into both eyes daily.   doxycycline 100 MG capsule Commonly known as: VIBRAMYCIN Take 1 capsule (100 mg total) by mouth 2 (two) times daily for 3 days.   protein supplement Liqd Take 30 mLs by mouth in the morning and at bedtime.   feeding supplement (ENSURE ENLIVE) Liqd Take 237 mLs by mouth 2 (two) times daily between meals.   ferrous sulfate 325 (65 FE) MG tablet Take 325 mg by mouth daily with breakfast.   FLUoxetine 40 MG capsule Commonly known as: PROZAC Take 1 capsule (40 mg total) by mouth daily.   furosemide 40 MG tablet Commonly known as: LASIX Take 1 tablet (40 mg total) by mouth daily.   gabapentin 100 MG capsule Commonly known as: NEURONTIN Take 200 mg by mouth 3  (three) times daily.   guaiFENesin 600 MG 12 hr tablet Commonly known as: MUCINEX Take 1 tablet (600 mg total) by mouth 2 (two) times daily.   ipratropium-albuterol 0.5-2.5 (3) MG/3ML Soln Commonly known as: DUONEB Take 3 mLs by nebulization 2 (two) times daily.   magnesium oxide 400 MG tablet Commonly known as: MAG-OX Take 1 tablet (400 mg total) by mouth 2 (two) times daily.   methocarbamol 500 MG tablet Commonly known as: ROBAXIN Take 1 tablet (500 mg total) by mouth 4 (four) times daily.   multivitamin with minerals Tabs tablet Take 1 tablet by mouth daily.   mycophenolate 180 MG EC tablet Commonly known as: MYFORTIC Take 180 mg by mouth 2 (two) times daily.   pantoprazole 40 MG tablet Commonly known as: PROTONIX Take 1 tablet (40 mg total) by mouth 2 (two) times daily.   predniSONE 5 MG tablet Commonly known as: DELTASONE Take 1.5 tablets (7.5 mg total) by mouth daily with breakfast.   Symbicort 160-4.5 MCG/ACT inhaler Generic drug: budesonide-formoterol Inhale 2 puffs into the lungs 2 (two) times daily.   tacrolimus 0.5 MG capsule Commonly known as: PROGRAF Take 0.5 mg by mouth at bedtime. Takes brand name Prograf   Prograf 1 MG capsule Generic drug: tacrolimus Take 1 mg  by mouth daily.   Travatan Z 0.004 % Soln ophthalmic solution Generic drug: Travoprost (BAK Free) Place 1 drop into both eyes at bedtime.   UNABLE TO FIND Take 120 mLs by mouth in the morning and at bedtime. Med Name: MedPass 2.0   valACYclovir 500 MG tablet Commonly known as: VALTREX Take 500 mg by mouth 2 (two) times daily.   verapamil 240 MG CR tablet Commonly known as: CALAN-SR Take 1 tablet (240 mg total) by mouth daily.   vitamin B-12 100 MCG tablet Commonly known as: CYANOCOBALAMIN Take 100 mcg by mouth daily.       Contact information for follow-up providers    [name], MD. Schedule an appointment as soon as possible for a visit in 1 week(s).   Specialty: Family  Medicine Contact information: [place] [address] [phone]            Contact information for after-discharge care    Destination    [facility] .   Service: Skilled Nursing Contact information: [address] [place] [phone]                 Allergies  Allergen Reactions  . Infed [Iron Dextran] Other (See Comments)    Chest tightness  . Pentamidine Itching, Shortness Of Breath and Swelling  . Erythromycin [Erythromycin] Other (See Comments)    Mouth Ulcers  . Iohexol Other (See Comments)    Per patient "she has had a kidney transplant and should never have contrast"  . Oxycodone Nausea Only and Nausea And Vomiting  . Erythromycin Rash    Causes breakout in mouth  . Ultram [Tramadol Hcl] Anxiety    Consultations:  None   Procedures/Studies: DG Chest 2 View  Result Date: 04/07/2020 CLINICAL DATA:  Chest pain, lethargy. EXAM: CHEST - 2 VIEW COMPARISON:  Chest x-rays dated 03/14/2020 and 02/29/2020. FINDINGS: Stable mild cardiomegaly. No confluent opacity to suggest consolidating pneumonia. No pleural effusion or pneumothorax is seen. Osseous structures about the chest are unremarkable. IMPRESSION: No active cardiopulmonary disease. No evidence of pneumonia or pulmonary edema. Electronically Signed   By: Franki Cabot M.D.   On: 04/07/2020 05:28   Portable chest 1 View  Result Date: 03/14/2020 CLINICAL DATA:  Pneumonia. EXAM: PORTABLE CHEST 1 VIEW COMPARISON:  03/13/2020.  02/17/2020. FINDINGS: Persistent low lung volumes with bibasilar atelectasis. Similar findings noted on prior exams. No pleural effusion or pneumothorax. Heart size stable. No acute bony sclerotic that bony structures appear stable. IMPRESSION: Persistent low lung volumes with bibasilar atelectasis. Similar findings noted on prior exams. Electronically Signed   By: Marcello Moores  Register   On: 03/14/2020 06:38   DG Chest Port 1  View  Result Date: 03/13/2020 CLINICAL DATA:  Shortness of breath, cough and hypoxia today. Weakness. EXAM: PORTABLE CHEST 1 VIEW COMPARISON:  03/08/2020 FINDINGS: Cardiac silhouette is top-normal in size. No mediastinal or hilar masses. Lung volumes are relatively low. Mild linear opacities noted at the bases consistent with atelectasis. Lungs otherwise clear. No convincing pleural effusion.  No pneumothorax. Skeletal structures are demineralized. There are rib fractures with associated callus formation on the right, lateral seventh and eighth ribs, which were present on the prior study. IMPRESSION: No acute cardiopulmonary disease. Electronically Signed   By: Lajean Manes M.D.   On: 03/13/2020 11:08   VAS [place] LOWER EXTREMITY VENOUS (DVT)  Result Date: 03/17/2020  Lower Venous DVTStudy Indications: Edema.  Limitations: Poor ultrasound/tissue interface. Comparison Study:  02/02/2020- negative lower extremity venous duplex Performing Technologist: Maudry Mayhew MHA, RDMS, RVT, RDCS  Examination Guidelines: A complete evaluation includes B-mode imaging, spectral Doppler, color Doppler, and power Doppler as needed of all accessible portions of each vessel. Bilateral testing is considered an integral part of a complete examination. Limited examinations for reoccurring indications may be performed as noted. The reflux portion of the exam is performed with the patient in reverse Trendelenburg.  +---------+---------------+---------+-----------+----------+--------------+ RIGHT    CompressibilityPhasicitySpontaneityPropertiesThrombus Aging +---------+---------------+---------+-----------+----------+--------------+ CFV      Full           Yes      Yes                                 +---------+---------------+---------+-----------+----------+--------------+ SFJ      Full                                                         +---------+---------------+---------+-----------+----------+--------------+ FV Prox  Full                                                        +---------+---------------+---------+-----------+----------+--------------+ FV Mid   Full                                                        +---------+---------------+---------+-----------+----------+--------------+ FV DistalFull                                                        +---------+---------------+---------+-----------+----------+--------------+ PFV      Full                                                        +---------+---------------+---------+-----------+----------+--------------+ POP      Full           Yes      Yes                                 +---------+---------------+---------+-----------+----------+--------------+ PTV      Full                                                        +---------+---------------+---------+-----------+----------+--------------+ PERO     Full                                                        +---------+---------------+---------+-----------+----------+--------------+  Right Technical Findings: Not visualized segments include limited visualization PTV/peroneal veins.  +---------+---------------+---------+-----------+----------+--------------+ LEFT     CompressibilityPhasicitySpontaneityPropertiesThrombus Aging +---------+---------------+---------+-----------+----------+--------------+ CFV      Full           Yes      Yes                                 +---------+---------------+---------+-----------+----------+--------------+ SFJ      Full                                                        +---------+---------------+---------+-----------+----------+--------------+ FV Prox  Full                                                        +---------+---------------+---------+-----------+----------+--------------+ FV Mid   Full                                                         +---------+---------------+---------+-----------+----------+--------------+ FV DistalFull                                                        +---------+---------------+---------+-----------+----------+--------------+ PFV      Full                                                        +---------+---------------+---------+-----------+----------+--------------+ POP      Full           Yes      Yes                                 +---------+---------------+---------+-----------+----------+--------------+ PTV      Full                                                        +---------+---------------+---------+-----------+----------+--------------+ PERO     Full                                                        +---------+---------------+---------+-----------+----------+--------------+   Left Technical Findings: Not visualized segments include limited visualization PTV/peroneal veins.   Summary: RIGHT: - There is no evidence of deep vein thrombosis in the lower extremity. However, portions of this  examination were limited- see technologist comments above.  - No cystic structure found in the popliteal fossa.  LEFT: - There is no evidence of deep vein thrombosis in the lower extremity. However, portions of this examination were limited- see technologist comments above.  - No cystic structure found in the popliteal fossa.  *See table(s) above for measurements and observations. Electronically signed by Harold Barban MD on 03/17/2020 at 11:42:00 AM.    Final      Subjective: No new complaints today.   Discharge Exam: Vitals:   04/11/20 0848 04/11/20 0943  BP:  (!) 144/67  Pulse:    Resp:    Temp:  98.1 F (36.7 C)  SpO2: 98% 100%   Vitals:   04/10/20 1940 04/10/20 2233 04/11/20 0848 04/11/20 0943  BP:  (!) 113/55  (!) 144/67  Pulse:  92    Resp:  20    Temp:  98.4 F (36.9 C)  98.1 F (36.7 C)  TempSrc:   Oral  Oral  SpO2: 98% 99% 98% 100%  Weight:      Height:        General: Pt is alert, awake, not in acute distress Cardiovascular: RRR, S1/S2 +, no rubs, no gallops Respiratory: CTA bilaterally, no wheezing, no rhonchi Abdominal: Soft, NT, ND, bowel sounds + Extremities: no edema, no cyanosis    The results of significant diagnostics from this hospitalization (including imaging, microbiology, ancillary and laboratory) are listed below for reference.     Microbiology: Recent Results (from the past 240 hour(s))  SARS Coronavirus 2 by RT PCR (hospital order, performed in [facility] hospital lab) Nasopharyngeal Nasopharyngeal Swab     Status: None   Collection Time: 04/07/20  5:48 AM   Specimen: Nasopharyngeal Swab  Result Value Ref Range Status   SARS Coronavirus 2 NEGATIVE NEGATIVE Final    Comment: (NOTE) SARS-CoV-2 target nucleic acids are NOT DETECTED. The SARS-CoV-2 RNA is generally detectable in upper and lower respiratory specimens during the acute phase of infection. The lowest concentration of SARS-CoV-2 viral copies this assay can detect is 250 copies / mL. A negative result does not preclude SARS-CoV-2 infection and should not be used as the sole basis for treatment or other patient management decisions.  A negative result may occur with improper specimen collection / handling, submission of specimen other than nasopharyngeal swab, presence of viral mutation(s) within the areas targeted by this assay, and inadequate number of viral copies (<250 copies / mL). A negative result must be combined with clinical observations, patient history, and epidemiological information. Fact Sheet for Patients:   StrictlyIdeas.no Fact Sheet for Healthcare Providers: BankingDealers.co.za This test is not yet approved or cleared  by the [place] FDA and has been authorized for detection and/or diagnosis of SARS-CoV-2 by FDA under an  Emergency Use Authorization (EUA).  This EUA will remain in effect (meaning this test can be used) for the duration of the COVID-19 declaration under Section 564(b)(1) of the Act, 21 U.S.C. section 360bbb-3(b)(1), unless the authorization is terminated or revoked sooner. Performed at [facility], [place] [address], [place], [place] [address]      Labs: BNP (last 3 results) Recent Labs    02/08/20 0436 03/13/20 1044 04/07/20 0541  BNP 459.3* 552.6* 924.2*   Basic Metabolic Panel: Recent Labs  Lab 04/07/20 0500 04/07/20 0608 04/08/20 0203  NA 135 135 136  K 3.9 3.9 4.3  CL 98  --  100  CO2 27  --  27  GLUCOSE 90  --  212*  BUN 21  --  33*  CREATININE 0.72  --  0.90  CALCIUM 9.4  --  9.7  MG  --   --  2.2   Liver Function Tests: No results for input(s): AST, ALT, ALKPHOS, BILITOT, PROT, ALBUMIN in the last 168 hours. No results for input(s): LIPASE, AMYLASE in the last 168 hours. No results for input(s): AMMONIA in the last 168 hours. CBC: Recent Labs  Lab 04/07/20 0500 04/07/20 0608 04/08/20 0203  WBC 9.6  --  9.2  HGB 7.1* 9.5* 7.4*  HCT 24.6* 28.0* 25.4*  MCV 99.2  --  98.4  PLT 285  --  289   Cardiac Enzymes: No results for input(s): CKTOTAL, CKMB, CKMBINDEX, TROPONINI in the last 168 hours. BNP: Invalid input(s): POCBNP CBG: Recent Labs  Lab 04/10/20 0816 04/10/20 1204 04/10/20 1633 04/10/20 2109 04/11/20 0733  GLUCAP 108* 120* 122* 168* 96   D-Dimer No results for input(s): DDIMER in the last 72 hours. Hgb A1c No results for input(s): HGBA1C in the last 72 hours. Lipid Profile No results for input(s): CHOL, HDL, LDLCALC, TRIG, CHOLHDL, LDLDIRECT in the last 72 hours. Thyroid function studies No results for input(s): TSH, T4TOTAL, T3FREE, THYROIDAB in the last 72 hours.  Invalid input(s): FREET3 Anemia work up No results for input(s): VITAMINB12, FOLATE, FERRITIN, TIBC, IRON, RETICCTPCT in the last 72 hours. Urinalysis    Component  Value Date/Time   COLORURINE YELLOW 04/07/2020 1520   APPEARANCEUR CLEAR 04/07/2020 1520   LABSPEC 1.013 04/07/2020 1520   PHURINE 5.0 04/07/2020 1520   GLUCOSEU NEGATIVE 04/07/2020 1520   HGBUR SMALL (A) 04/07/2020 1520   BILIRUBINUR NEGATIVE 04/07/2020 [place] 04/07/2020 1520   PROTEINUR 30 (A) 04/07/2020 1520   UROBILINOGEN 0.2 05/15/2013 0733   NITRITE NEGATIVE 04/07/2020 [place] 04/07/2020 1520   Sepsis Labs Invalid input(s): PROCALCITONIN,  WBC,  LACTICIDVEN Microbiology Recent Results (from the past 240 hour(s))  SARS Coronavirus 2 by RT PCR (hospital order, performed in [place] hospital lab) Nasopharyngeal Nasopharyngeal Swab     Status: None   Collection Time: 04/07/20  5:48 AM   Specimen: Nasopharyngeal Swab  Result Value Ref Range Status   SARS Coronavirus 2 NEGATIVE NEGATIVE Final    Comment: (NOTE) SARS-CoV-2 target nucleic acids are NOT DETECTED. The SARS-CoV-2 RNA is generally detectable in upper and lower respiratory specimens during the acute phase of infection. The lowest concentration of SARS-CoV-2 viral copies this assay can detect is 250 copies / mL. A negative result does not preclude SARS-CoV-2 infection and should not be used as the sole basis for treatment or other patient management decisions.  A negative result may occur with improper specimen collection / handling, submission of specimen other than nasopharyngeal swab, presence of viral mutation(s) within the areas targeted by this assay, and inadequate number of viral copies (<250 copies / mL). A negative result must be combined with clinical observations, patient history, and epidemiological information. Fact Sheet for Patients:   StrictlyIdeas.no Fact Sheet for Healthcare Providers: BankingDealers.co.za This test is not yet approved or cleared  by the [place] FDA and has been authorized for detection  and/or diagnosis of SARS-CoV-2 by FDA under an Emergency Use Authorization (EUA).  This EUA will remain in effect (meaning this test can be used) for the duration of the COVID-19 declaration under Section 564(b)(1) of the Act, 21 U.S.C. section 360bbb-3(b)(1), unless the authorization is  terminated or revoked sooner. Performed at [facility], [place] [address] [place], [place] [address]      SIGNED:   Dr Hosie Poisson ,  MD [facility] 04/11/2020, 10:10 AM

## 2020-04-15 ENCOUNTER — Emergency Department (HOSPITAL_COMMUNITY): Payer: Medicare Other

## 2020-04-15 ENCOUNTER — Inpatient Hospital Stay (HOSPITAL_COMMUNITY)
Admission: EM | Admit: 2020-04-15 | Discharge: 2020-04-19 | DRG: 871 | Disposition: A | Discharge status: Skilled Nursing Facility | Payer: Medicare Other | Attending: Internal Medicine | Admitting: Internal Medicine

## 2020-04-15 ENCOUNTER — Encounter (HOSPITAL_COMMUNITY): Payer: Self-pay | Admitting: Radiology

## 2020-04-15 ENCOUNTER — Other Ambulatory Visit: Payer: Self-pay

## 2020-04-15 DIAGNOSIS — B961 Klebsiella pneumoniae [K. pneumoniae] as the cause of diseases classified elsewhere: Secondary | ICD-10-CM | POA: Diagnosis present

## 2020-04-15 DIAGNOSIS — R0602 Shortness of breath: Secondary | ICD-10-CM

## 2020-04-15 DIAGNOSIS — I3139 Other pericardial effusion (noninflammatory): Secondary | ICD-10-CM | POA: Diagnosis present

## 2020-04-15 DIAGNOSIS — Z888 Allergy status to other drugs, medicaments and biological substances status: Secondary | ICD-10-CM

## 2020-04-15 DIAGNOSIS — Z7951 Long term (current) use of inhaled steroids: Secondary | ICD-10-CM

## 2020-04-15 DIAGNOSIS — Z7982 Long term (current) use of aspirin: Secondary | ICD-10-CM

## 2020-04-15 DIAGNOSIS — I471 Supraventricular tachycardia: Secondary | ICD-10-CM | POA: Diagnosis present

## 2020-04-15 DIAGNOSIS — R809 Proteinuria, unspecified: Secondary | ICD-10-CM | POA: Diagnosis present

## 2020-04-15 DIAGNOSIS — J449 Chronic obstructive pulmonary disease, unspecified: Secondary | ICD-10-CM | POA: Diagnosis present

## 2020-04-15 DIAGNOSIS — D638 Anemia in other chronic diseases classified elsewhere: Secondary | ICD-10-CM | POA: Diagnosis present

## 2020-04-15 DIAGNOSIS — M1990 Unspecified osteoarthritis, unspecified site: Secondary | ICD-10-CM | POA: Diagnosis present

## 2020-04-15 DIAGNOSIS — A4159 Other Gram-negative sepsis: Secondary | ICD-10-CM | POA: Diagnosis not present

## 2020-04-15 DIAGNOSIS — J9611 Chronic respiratory failure with hypoxia: Secondary | ICD-10-CM | POA: Diagnosis present

## 2020-04-15 DIAGNOSIS — Z91041 Radiographic dye allergy status: Secondary | ICD-10-CM

## 2020-04-15 DIAGNOSIS — Z940 Kidney transplant status: Secondary | ICD-10-CM

## 2020-04-15 DIAGNOSIS — Z20822 Contact with and (suspected) exposure to covid-19: Secondary | ICD-10-CM | POA: Diagnosis present

## 2020-04-15 DIAGNOSIS — F329 Major depressive disorder, single episode, unspecified: Secondary | ICD-10-CM | POA: Diagnosis present

## 2020-04-15 DIAGNOSIS — E785 Hyperlipidemia, unspecified: Secondary | ICD-10-CM | POA: Diagnosis present

## 2020-04-15 DIAGNOSIS — M549 Dorsalgia, unspecified: Secondary | ICD-10-CM

## 2020-04-15 DIAGNOSIS — I110 Hypertensive heart disease with heart failure: Secondary | ICD-10-CM | POA: Diagnosis present

## 2020-04-15 DIAGNOSIS — Z79899 Other long term (current) drug therapy: Secondary | ICD-10-CM

## 2020-04-15 DIAGNOSIS — W010XXA Fall on same level from slipping, tripping and stumbling without subsequent striking against object, initial encounter: Secondary | ICD-10-CM | POA: Diagnosis present

## 2020-04-15 DIAGNOSIS — I5033 Acute on chronic diastolic (congestive) heart failure: Secondary | ICD-10-CM | POA: Diagnosis present

## 2020-04-15 DIAGNOSIS — Z87891 Personal history of nicotine dependence: Secondary | ICD-10-CM

## 2020-04-15 DIAGNOSIS — I313 Pericardial effusion (noninflammatory): Secondary | ICD-10-CM | POA: Diagnosis present

## 2020-04-15 DIAGNOSIS — Z8249 Family history of ischemic heart disease and other diseases of the circulatory system: Secondary | ICD-10-CM

## 2020-04-15 DIAGNOSIS — Z96642 Presence of left artificial hip joint: Secondary | ICD-10-CM | POA: Diagnosis present

## 2020-04-15 DIAGNOSIS — J441 Chronic obstructive pulmonary disease with (acute) exacerbation: Secondary | ICD-10-CM | POA: Diagnosis present

## 2020-04-15 DIAGNOSIS — Z881 Allergy status to other antibiotic agents status: Secondary | ICD-10-CM

## 2020-04-15 DIAGNOSIS — Z7952 Long term (current) use of systemic steroids: Secondary | ICD-10-CM

## 2020-04-15 DIAGNOSIS — Q613 Polycystic kidney, unspecified: Secondary | ICD-10-CM

## 2020-04-15 DIAGNOSIS — K219 Gastro-esophageal reflux disease without esophagitis: Secondary | ICD-10-CM | POA: Diagnosis present

## 2020-04-15 DIAGNOSIS — Z8271 Family history of polycystic kidney: Secondary | ICD-10-CM

## 2020-04-15 DIAGNOSIS — Z885 Allergy status to narcotic agent status: Secondary | ICD-10-CM

## 2020-04-15 DIAGNOSIS — J9621 Acute and chronic respiratory failure with hypoxia: Secondary | ICD-10-CM | POA: Diagnosis present

## 2020-04-15 DIAGNOSIS — E213 Hyperparathyroidism, unspecified: Secondary | ICD-10-CM | POA: Diagnosis present

## 2020-04-15 DIAGNOSIS — N390 Urinary tract infection, site not specified: Secondary | ICD-10-CM | POA: Diagnosis present

## 2020-04-15 DIAGNOSIS — R0603 Acute respiratory distress: Secondary | ICD-10-CM | POA: Diagnosis present

## 2020-04-15 DIAGNOSIS — A419 Sepsis, unspecified organism: Secondary | ICD-10-CM

## 2020-04-15 DIAGNOSIS — N179 Acute kidney failure, unspecified: Secondary | ICD-10-CM | POA: Diagnosis not present

## 2020-04-15 DIAGNOSIS — R0600 Dyspnea, unspecified: Secondary | ICD-10-CM

## 2020-04-15 HISTORY — DX: Chronic obstructive pulmonary disease, unspecified: J44.9

## 2020-04-15 LAB — CBC WITH DIFFERENTIAL/PLATELET
Abs Immature Granulocytes: 0 10*3/uL (ref 0.00–0.07)
Basophils Absolute: 0.2 10*3/uL — ABNORMAL HIGH (ref 0.0–0.1)
Basophils Relative: 1 %
Eosinophils Absolute: 0 10*3/uL (ref 0.0–0.5)
Eosinophils Relative: 0 %
HCT: 28.1 % — ABNORMAL LOW (ref 36.0–46.0)
Hemoglobin: 8.3 g/dL — ABNORMAL LOW (ref 12.0–15.0)
Lymphocytes Relative: 8 %
Lymphs Abs: 1.8 10*3/uL (ref 0.7–4.0)
MCH: 29.7 pg (ref 26.0–34.0)
MCHC: 29.5 g/dL — ABNORMAL LOW (ref 30.0–36.0)
MCV: 100.7 fL — ABNORMAL HIGH (ref 80.0–100.0)
Monocytes Absolute: 0.5 10*3/uL (ref 0.1–1.0)
Monocytes Relative: 2 %
Neutro Abs: 20.4 10*3/uL — ABNORMAL HIGH (ref 1.7–7.7)
Neutrophils Relative %: 89 %
Platelets: 234 10*3/uL (ref 150–400)
RBC: 2.79 MIL/uL — ABNORMAL LOW (ref 3.87–5.11)
RDW: 24.7 % — ABNORMAL HIGH (ref 11.5–15.5)
WBC: 22.9 10*3/uL — ABNORMAL HIGH (ref 4.0–10.5)
nRBC: 0 % (ref 0.0–0.2)
nRBC: 0 /100 WBC

## 2020-04-15 LAB — URINALYSIS, ROUTINE W REFLEX MICROSCOPIC
Bilirubin Urine: NEGATIVE
Glucose, UA: NEGATIVE mg/dL
Ketones, ur: NEGATIVE mg/dL
Nitrite: NEGATIVE
Protein, ur: >=300 mg/dL — AB
Specific Gravity, Urine: 1.018 (ref 1.005–1.030)
WBC, UA: >50 WBC/hpf — ABNORMAL HIGH (ref 0–5)
pH: 5 (ref 5.0–8.0)

## 2020-04-15 LAB — COMPREHENSIVE METABOLIC PANEL
ALT: 14 U/L (ref 0–44)
AST: 21 U/L (ref 15–41)
Albumin: 2.5 g/dL — ABNORMAL LOW (ref 3.5–5.0)
Alkaline Phosphatase: 84 U/L (ref 38–126)
Anion gap: 10 (ref 5–15)
BUN: 21 mg/dL (ref 8–23)
CO2: 26 mmol/L (ref 22–32)
Calcium: 9.5 mg/dL (ref 8.9–10.3)
Chloride: 98 mmol/L (ref 98–111)
Creatinine, Ser: 0.77 mg/dL (ref 0.44–1.00)
GFR calc Af Amer: >60 mL/min (ref >60)
GFR calc non Af Amer: >60 mL/min (ref >60)
Glucose, Bld: 136 mg/dL — ABNORMAL HIGH (ref 70–99)
Potassium: 4.1 mmol/L (ref 3.5–5.1)
Sodium: 134 mmol/L — ABNORMAL LOW (ref 135–145)
Total Bilirubin: 1.2 mg/dL (ref 0.3–1.2)
Total Protein: 7.9 g/dL (ref 6.5–8.1)

## 2020-04-15 LAB — PROTIME-INR
INR: 1.4 — ABNORMAL HIGH (ref 0.8–1.2)
Prothrombin Time: 16.3 seconds — ABNORMAL HIGH (ref 11.4–15.2)

## 2020-04-15 LAB — SARS CORONAVIRUS 2 BY RT PCR (HOSPITAL ORDER, PERFORMED IN ~~LOC~~ HOSPITAL LAB): SARS Coronavirus 2: NEGATIVE

## 2020-04-15 LAB — LACTIC ACID, PLASMA
Lactic Acid, Venous: 1.1 mmol/L (ref 0.5–1.9)
Lactic Acid, Venous: 1 mmol/L (ref 0.5–1.9)

## 2020-04-15 LAB — APTT: aPTT: 40 seconds — ABNORMAL HIGH (ref 24–36)

## 2020-04-15 MED ORDER — VANCOMYCIN HCL 1500 MG/300ML IV SOLN
1500.0000 mg | Freq: Once | INTRAVENOUS | Status: AC
  Administered 2020-04-15: 1500 mg via INTRAVENOUS
  Filled 2020-04-15: qty 300

## 2020-04-15 MED ORDER — SODIUM CHLORIDE 0.9 % IV SOLN
2.0000 g | Freq: Once | INTRAVENOUS | Status: AC
  Administered 2020-04-15: 2 g via INTRAVENOUS
  Filled 2020-04-15: qty 2

## 2020-04-15 MED ORDER — VANCOMYCIN HCL IN DEXTROSE 1-5 GM/200ML-% IV SOLN: 1000.0000 mg | Freq: Two times a day (BID) | INTRAVENOUS | Status: DC

## 2020-04-15 MED ORDER — METRONIDAZOLE IN NACL 5-0.79 MG/ML-% IV SOLN
500.0000 mg | Freq: Once | INTRAVENOUS | Status: AC
  Administered 2020-04-15: 500 mg via INTRAVENOUS
  Filled 2020-04-15: qty 100

## 2020-04-15 MED ORDER — ACETAMINOPHEN 325 MG PO TABS
650.0000 mg | ORAL_TABLET | Freq: Once | ORAL | Status: AC
  Administered 2020-04-15: 650 mg via ORAL
  Filled 2020-04-15: qty 2

## 2020-04-15 MED ORDER — LACTATED RINGERS IV BOLUS (SEPSIS)
1000.0000 mL | Freq: Once | INTRAVENOUS | Status: AC
  Administered 2020-04-15: 1000 mL via INTRAVENOUS

## 2020-04-15 MED ORDER — SODIUM CHLORIDE 0.9 % IV SOLN
2.0000 g | Freq: Three times a day (TID) | INTRAVENOUS | Status: DC
  Administered 2020-04-16 – 2020-04-18 (×7): 2 g via INTRAVENOUS
  Filled 2020-04-15 (×7): qty 2

## 2020-04-15 MED ORDER — PROMETHAZINE HCL 25 MG/ML IJ SOLN
12.5000 mg | Freq: Once | INTRAMUSCULAR | Status: AC
  Administered 2020-04-15: 12.5 mg via INTRAVENOUS
  Filled 2020-04-15: qty 1

## 2020-04-15 MED ORDER — LACTATED RINGERS IV BOLUS
1000.0000 mL | Freq: Once | INTRAVENOUS | Status: AC
  Administered 2020-04-15: 1000 mL via INTRAVENOUS

## 2020-04-15 MED ORDER — VANCOMYCIN HCL IN DEXTROSE 1-5 GM/200ML-% IV SOLN: 1000.0000 mg | Freq: Once | INTRAVENOUS | Status: DC

## 2020-04-15 NOTE — ED Notes (Signed)
Pt's daughter updates in regards to admission by PA

## 2020-04-15 NOTE — ED Notes (Signed)
April [name]  [phone]  daug.

## 2020-04-15 NOTE — ED Notes (Signed)
Pt's daughter [name] updated pr pt's request. Informed XR and CTs ordered as well as IV ABX and labs. Pt's daughter concerned about recent fall at facility informed hip to be XR. Daughter would like call back in regards to admission.

## 2020-04-15 NOTE — ED Notes (Signed)
April [name]  [phone]  Daug.please call with an update

## 2020-04-15 NOTE — Progress Notes (Signed)
Pharmacy Antibiotic Note  April [name] is a 64 y.o. female admitted on 04/15/2020 with sepsis.  Pharmacy has been consulted for vancomycin and cefepime dosing. Pt is febrile with Tmax 102.7 and WBC is elevated at 22.9. Scr and lactic acid are WNL.   Plan: Vancomycin 1500mg  IV x 1 then 1gm IV Q12H Cefepime 2gm IV Q8H F/u renal fxn, C&S, clinical status and trough at SS  Height: 5\' 1"  (154.9 cm) Weight: 74.8 kg (165 lb) IBW/kg (Calculated) : 47.8  Temp (24hrs), Avg:102.7 F (39.3 C), Min:102.7 F (39.3 C), Max:102.7 F (39.3 C)  Recent Labs  Lab 04/15/20 2005  WBC 22.9*  CREATININE 0.77  LATICACIDVEN 1.1    Estimated Creatinine Clearance: 67.5 mL/min (by C-G formula based on SCr of 0.77 mg/dL).    Allergies  Allergen Reactions  . Infed [Iron Dextran] Other (See Comments)    Chest tightness  . Pentamidine Itching, Shortness Of Breath and Swelling  . Erythromycin [Erythromycin] Other (See Comments)    Mouth Ulcers  . Iohexol Other (See Comments)    Per patient "she has had a kidney transplant and should never have contrast"  . Oxycodone Nausea Only and Nausea And Vomiting  . Erythromycin Rash    Causes breakout in mouth  . Ultram [Tramadol Hcl] Anxiety    Antimicrobials this admission: Vanc 5/24>> Cefepime 5/24>> Flagyl x 1 5/24  Dose adjustments this admission: N/A  Microbiology results: Pending  Thank you for allowing pharmacy to be a part of this patient's care.  Nohemi Nicklaus, Rande Lawman 04/15/2020 7:17 PM

## 2020-04-15 NOTE — ED Triage Notes (Signed)
Pt BIB [facility] after c/o of worsening ShOB and increase N/V.Upon arrival, Pt temp 102.7 rectal, RR 23 and HR 112. Pt also reports falling recently and having L-sided rib pain. Denies taking thinners.

## 2020-04-15 NOTE — ED Provider Notes (Signed)
[facility] EMERGENCY DEPARTMENT Provider Note   CSN: [id number] Arrival date & time: 04/15/20  1855     History Chief Complaint  Patient presents with  . Shortness of Breath  . Nausea    April [name] is a 64 y.o. 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  Patient states approximately 12 hours prior to arrival she had sudden onset of nausea.  She has had approximately 5 episodes of nonbloody nonbilious emesis prior to arrival.  She states she has a prescription for Phenergan however was not given any at her rehab facility prior to arrival.  She denies any fever, chills, cough, chest pain associated abdominal pain, back pain, flank pain, urinary symptoms, diarrhea, blood in stool. Patient also reports she had a fall x3 days ago. Since the fall she has had pain on the left side of her chest and left hip. She has had difficulty walking around because of the pain.  She denies hitting her head or loss of consciousness.  She states the pain has been constant.  It is a soreness sensation.  She rates the pain 6 of 10 in severity.  She denies any pain medications prior to arrival.    Patient did not receive covid vaccinations.      Past Medical History:  Diagnosis Date  . Arthritis   . Asthma   . CHF (congestive heart failure) ([place])   . Depression   . Essential hypertension 02/08/2020  . GERD (gastroesophageal reflux disease)   . Hyperlipidemia   . Hyperparathyroidism   . Polycystic kidney   . PONV (postoperative nausea and vomiting)     Patient Active Problem List   Diagnosis Date Noted  . Pressure injury of skin 04/08/2020  . COPD exacerbation ([place]) 04/07/2020  . Acute on chronic respiratory failure with hypoxia ([place]) 03/14/2020  . PNA (pneumonia) 03/13/2020  . SVT  (supraventricular tachycardia) ([place])   . Anemia of chronic disease   . Palliative care encounter   . Goals of care, counseling/discussion   . Essential hypertension 02/08/2020  . Glaucoma 02/08/2020  . Sepsis ([place]) 01/31/2020  . Hypoxia   . Shortness of breath   . Fracture of left superior pubic ramus (H[facility])   . Pain   . Anxiety state   . Left displaced femoral neck fracture ([place]) 12/15/2019  . Status post total hip replacement, left   . Post-op pain   . Acute blood loss anemia   . Polycystic kidney   . Closed left hip fracture ([place]) 12/07/2019  . Left wrist fracture 12/07/2019  . Medication management 09/22/2019  . Physical deconditioning 09/22/2019  . Hyponatremia 06/29/2019  . Acute respiratory failure with hypoxia ([place]) 04/19/2019  . Acute on chronic diastolic CHF (congestive heart failure) ([place]) 04/19/2019  . Bilateral closed proximal tibial fracture 12/21/2018  . Closed right ankle fracture 12/21/2018  . Fracture 12/21/2018  . Lower urinary tract infectious disease 10/03/2018  . Asthma, chronic, unspecified asthma severity, with acute exacerbation 10/03/2018  . SIRS (systemic inflammatory response syndrome) ([place]) 10/03/2018  . Nausea vomiting and diarrhea 10/02/2018  . Chronic diastolic CHF (congestive heart failure) ([place]) 10/01/2017  . Influenza B 10/01/2017  . Persistent asthma with status asthmaticus   . Pneumonia 07/15/2016  . Asthma, mild intermittent 07/15/2016  . GERD (gastroesophageal reflux disease) 07/15/2016  . Renal transplant recipient  07/15/2016  . Hyperlipidemia 07/15/2016  . Depression 07/15/2016  . Bronchitis, mucopurulent recurrent ([place]) 08/08/2014  . Chronic cough 07/24/2014  . End stage renal disease ([place]) 03/23/2012  . 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 BILATERAL OOPHORECTOMY     . CARPAL TUNNEL RELEASE  2000  . CESAREAN SECTION    . INSERTION OF DIALYSIS CATHETER  03/31/2012   Procedure: INSERTION OF DIALYSIS CATHETER;  Surgeon: Angelia Mould, MD;  Location: [facility];  Service: Vascular;  Laterality: N/A;  insertion of dialysis catheter right internal jugular vein  . 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 ORIF TIBIA FRACTURE Left 12/23/2018   Procedure: OPEN REDUCTION INTERNAL FIXATION (ORIF) TIBIA FRACTURE;  Surgeon: Shona Needles, MD;  Location: [facility];  Service: Orthopedics;  Laterality: Left;  . ORIF TIBIA PLATEAU Right 12/23/2018   Procedure: OPEN REDUCTION INTERNAL FIXATION (ORIF) TIBIAL PLATEAU;  Surgeon: Shona Needles, MD;  Location: [facility];  Service: Orthopedics;  Laterality: Right;  . ORIF WRIST FRACTURE Left 12/08/2019   Procedure: OPEN REDUCTION INTERNAL FIXATION (ORIF) WRIST FRACTURE, REPAIR LACERATION LEFT FOREARM;  Surgeon: Dorna Leitz, MD;  Location: [facility];  Service: Orthopedics;  Laterality: Left;  . TONSILLECTOMY  1967  . TOTAL HIP ARTHROPLASTY Left 12/08/2019   Procedure: TOTAL HIP ARTHROPLASTY ANTERIOR APPROACH;  Surgeon: Dorna Leitz, MD;  Location: [facility];  Service: Orthopedics;  Laterality: Left;  . TUBAL LIGATION  2010     OB History   No obstetric history on file.     Family History  Problem Relation Age of Onset  . Hypertension Mother   . Heart disease Father        CABG history  . Polycystic kidney disease Father   . Polycystic kidney disease Brother     Social History   Tobacco Use  . Smoking status: Former Smoker    Packs/day: 0.25    Years: 15.00    Pack years: 3.75    Types: Cigarettes    Quit date: 03/22/1998    Years since quitting: 22.0  . Smokeless tobacco: Never Used  Substance Use Topics  . Alcohol use: No    Comment: social  . Drug use: No    Home Medications Prior to Admission medications   Medication Sig Start Date End Date Taking? Authorizing Provider  albuterol (VENTOLIN HFA) 108 (90 Base) MCG/ACT  inhaler Inhale 2 puffs into the lungs every 4 (four) hours as needed for wheezing or shortness of breath. 01/04/20  Yes Angiulli, Lavon Paganini, PA-C  allopurinol (ZYLOPRIM) 300 MG tablet Take 1 tablet (300 mg total) by mouth daily. 01/04/20  Yes Angiulli, Lavon Paganini, PA-C  Amino Acids-Protein Hydrolys (FEEDING SUPPLEMENT, PRO-STAT SUGAR FREE 64,) LIQD Take 30 mLs by mouth 2 (two) times daily with a meal.   Yes [provider]  ascorbic acid (VITAMIN C) 500 MG tablet Take 500 mg by mouth daily.   Yes [provider]  aspirin 81 MG chewable tablet Chew 81 mg by mouth daily.   Yes [provider]  brimonidine (ALPHAGAN P) 0.1 % SOLN Place 1 drop into both eyes 2 (two) times daily.    Yes [provider]  cholecalciferol (VITAMIN D3) 25 MCG (1000 UNIT) tablet Take 1 tablet (1,000 Units total) by mouth daily. 01/04/20  Yes Panora, Lavon Paganini,  PA-C  cycloSPORINE (RESTASIS) 0.05 % ophthalmic emulsion Place 1 drop into both eyes 2 (two) times daily. 01/04/20  Yes Angiulli, Lavon Paganini, PA-C  docusate sodium (COLACE) 100 MG capsule Take 1 capsule (100 mg total) by mouth 2 (two) times daily. 12/13/19  Yes Shelly Coss, MD  dorzolamide (TRUSOPT) 2 % ophthalmic solution Place 1 drop into both eyes daily. 06/25/19  Yes [provider]  feeding supplement, ENSURE ENLIVE, (ENSURE ENLIVE) LIQD Take 237 mLs by mouth 2 (two) times daily between meals. 02/23/20  Yes Alma Friendly, MD  ferrous sulfate 325 (65 FE) MG tablet Take 325 mg by mouth daily with breakfast.   Yes [provider]  FLUoxetine (PROZAC) 40 MG capsule Take 1 capsule (40 mg total) by mouth daily. 01/04/20  Yes Angiulli, Lavon Paganini, PA-C  furosemide (LASIX) 40 MG tablet Take 1 tablet (40 mg total) by mouth daily. 02/07/20  Yes Sheikh, Omair Latif, DO  gabapentin (NEURONTIN) 100 MG capsule Take 200 mg by mouth 3 (three) times daily.   Yes [provider]  guaiFENesin (MUCINEX) 600 MG 12 hr tablet Take 1  tablet (600 mg total) by mouth 2 (two) times daily. 02/06/20  Yes Sheikh, Omair Latif, DO  hydrOXYzine (ATARAX/VISTARIL) 10 MG tablet Take 10 mg by mouth 2 (two) times daily as needed for anxiety.   Yes [provider]  ipratropium-albuterol (DUONEB) 0.5-2.5 (3) MG/3ML SOLN Take 3 mLs by nebulization 2 (two) times daily. 03/18/20 04/17/20 Yes Arrien, Jimmy Picket, MD  magnesium oxide (MAG-OX) 400 MG tablet Take 1 tablet (400 mg total) by mouth 2 (two) times daily. 01/04/20  Yes Angiulli, Lavon Paganini, PA-C  methocarbamol (ROBAXIN) 500 MG tablet Take 1 tablet (500 mg total) by mouth 4 (four) times daily. 01/04/20  Yes Angiulli, Lavon Paganini, PA-C  Multiple Vitamin (MULTIVITAMIN WITH MINERALS) TABS tablet Take 1 tablet by mouth daily. 02/24/20  Yes Alma Friendly, MD  mycophenolate (MYFORTIC) 180 MG EC tablet Take 180 mg by mouth 2 (two) times daily.   Yes [provider]  pantoprazole (PROTONIX) 40 MG tablet Take 1 tablet (40 mg total) by mouth 2 (two) times daily. 01/04/20  Yes Angiulli, Lavon Paganini, PA-C  predniSONE (DELTASONE) 5 MG tablet Take 1.5 tablets (7.5 mg total) by mouth daily with breakfast. 03/18/20  Yes Arrien, Jimmy Picket, MD  PROGRAF 1 MG capsule Take 1 mg by mouth daily. 03/22/19  Yes [provider]  protein supplement (PROSOURCE NO CARB) LIQD Take 30 mLs by mouth in the morning and at bedtime.   Yes [provider]  SYMBICORT 160-4.5 MCG/ACT inhaler Inhale 2 puffs into the lungs 2 (two) times daily. 01/04/20  Yes Angiulli, Lavon Paganini, PA-C  tacrolimus (PROGRAF) 0.5 MG capsule Take 0.5 mg by mouth at bedtime. Takes brand name Prograf   Yes [provider]  TRAVATAN Z 0.004 % SOLN ophthalmic solution Place 1 drop into both eyes at bedtime. 07/03/16  Yes [provider]  tuberculin (APLISOL) 5 UNIT/0.1ML injection Inject 5 Units into the skin once.   Yes [provider]  valACYclovir (VALTREX) 500 MG tablet Take 500 mg by mouth 2 (two)  times daily.   Yes [provider]  verapamil (CALAN-SR) 240 MG CR tablet Take 1 tablet (240 mg total) by mouth daily. 03/19/20 04/18/20 Yes Arrien, Jimmy Picket, MD  vitamin B-12 (CYANOCOBALAMIN) 100 MCG tablet Take 1,000 mcg by mouth daily.    Yes [provider]    Allergies  Infed [iron dextran], Pentamidine, Erythromycin [erythromycin], Iohexol, Oxycodone, Erythromycin, and Ultram [tramadol hcl]  Review of Systems   Review of Systems All other systems are reviewed and are negative for acute change except as noted in the HPI.  Physical Exam Updated Vital Signs BP (!) 162/60   Pulse (!) 107   Temp 98 F (36.7 C) (Oral)   Resp (!) 27   Ht 5\' 1"  (1.549 m)   Wt 74.8 kg   SpO2 100%   BMI 31.18 kg/m   Physical Exam Vitals and nursing note reviewed.  Constitutional:      General: She is not in acute distress.    Appearance: She is not ill-appearing.  HENT:     Head: Normocephalic and atraumatic.     Right Ear: Tympanic membrane and external ear normal.     Left Ear: Tympanic membrane and external ear normal.     Nose: Nose normal.     Mouth/Throat:     Mouth: Mucous membranes are moist.     Pharynx: Oropharynx is clear.  Eyes:     General: No scleral icterus.       Right eye: No discharge.        Left eye: No discharge.     Extraocular Movements: Extraocular movements intact.     Conjunctiva/sclera: Conjunctivae normal.     Pupils: Pupils are equal, round, and reactive to light.  Neck:     Vascular: No JVD.     Comments: Full ROM intact without spinous process TTP. No bony stepoffs or deformities, no paraspinous muscle TTP or muscle spasms. No rigidity or meningeal signs. No bruising, erythema, or swelling.  Cardiovascular:     Rate and Rhythm: Regular rhythm. Tachycardia present.     Pulses: Normal pulses.          Radial pulses are 2+ on the right side and 2+ on the left side.     Heart sounds: Normal heart sounds.  Pulmonary:     Comments:  Lungs clear to auscultation in all fields. Symmetric chest rise. No wheezing, rales, or rhonchi. Chest:       Comments: Tenderness to palpation as depicted in image above.  No crepitus or evidence of flail chest.  No overlying skin changes. Abdominal:     Tenderness: There is no right CVA tenderness or left CVA tenderness.     Comments: Abdomen is soft, non-distended, and non-tender in all quadrants. No rigidity, no guarding. No peritoneal signs.  Musculoskeletal:     Right shoulder: Normal.     Left shoulder: Normal.     Right elbow: Normal.     Left elbow: Normal.     Right wrist: Normal.     Left wrist: Normal.     Cervical back: Normal range of motion.     Right hip: Normal.     Left hip: Bony tenderness present. No deformity or crepitus. Normal range of motion.     Comments: Full range of motion of the T-spine and L-spine No tenderness to palpation of the spinous processes of the T-spine or L-spine No crepitus, deformity or step-offs Tenderness to palpation of left paraspinal muscles of the L-spine      Skin:    General: Skin is warm and dry.     Capillary Refill: Capillary refill takes less than 2 seconds.  Neurological:     Mental Status: She is oriented to person, place, and time.     GCS: GCS eye subscore is 4. GCS  verbal subscore is 5. GCS motor subscore is 6.     Comments: Fluent speech, no facial droop.  Psychiatric:        Behavior: Behavior normal.     ED Results / Procedures / Treatments   Labs (all labs ordered are listed, but only abnormal results are displayed) Labs Reviewed  COMPREHENSIVE METABOLIC PANEL - Abnormal; Notable for the following components:      Result Value   Sodium 134 (*)    Glucose, Bld 136 (*)    Albumin 2.5 (*)    All other components within normal limits  CBC WITH DIFFERENTIAL/PLATELET - Abnormal; Notable for the following components:   WBC 22.9 (*)    RBC 2.79 (*)    Hemoglobin 8.3 (*)    HCT 28.1 (*)    MCV 100.7 (*)     MCHC 29.5 (*)    RDW 24.7 (*)    Neutro Abs 20.4 (*)    Basophils Absolute 0.2 (*)    All other components within normal limits  APTT - Abnormal; Notable for the following components:   aPTT 40 (*)    All other components within normal limits  PROTIME-INR - Abnormal; Notable for the following components:   Prothrombin Time 16.3 (*)    INR 1.4 (*)    All other components within normal limits  URINALYSIS, ROUTINE W REFLEX MICROSCOPIC - Abnormal; Notable for the following components:   Color, Urine AMBER (*)    APPearance CLOUDY (*)    Hgb urine dipstick SMALL (*)    Protein, ur >=300 (*)    Leukocytes,Ua LARGE (*)    WBC, UA >50 (*)    Bacteria, UA RARE (*)    Non Squamous Epithelial 0-5 (*)    All other components within normal limits  SARS CORONAVIRUS 2 BY RT PCR (HOSPITAL ORDER, [facility])  CULTURE, BLOOD (ROUTINE X 2)  CULTURE, BLOOD (ROUTINE X 2)  URINE CULTURE  LACTIC ACID, PLASMA  LACTIC ACID, PLASMA    EKG EKG Interpretation  Date/Time:  Monday Apr 15 2020 19:01:07 EDT Ventricular Rate:  114 PR Interval:    QRS Duration: 91 QT Interval:  319 QTC Calculation: 440 R Axis:   79 Text Interpretation: Sinus tachycardia Minimal ST depression, inferior leads Since last tracing rate faster Confirmed by Wandra Arthurs [phone]) on 04/15/2020 7:17:47 PM   Radiology CT ABDOMEN PELVIS WO CONTRAST  Result Date: 04/15/2020 CLINICAL DATA:  Worsening shortness of breath. Increased nausea and vomiting. Fever. Patient reports with recent fall with left-sided rib pain. EXAM: CT CHEST, ABDOMEN AND PELVIS WITHOUT CONTRAST TECHNIQUE: Multidetector CT imaging of the chest, abdomen and pelvis was performed following the standard protocol without IV contrast. COMPARISON:  Radiograph earlier this day. Most recent chest CT 07/27/2014, most recent abdominal CT 03/31/2019 FINDINGS: CT CHEST FINDINGS Cardiovascular: No periaortic stranding to suggest injury. Dilated main  pulmonary artery at 4.1 cm. Small circumferential pericardial effusion. Multi chamber cardiomegaly. There are prominent venous collaterals anterior to the left axilla and in the included left upper extremity. Decreased density of the blood pool suggesting anemia. Mediastinum/Nodes: No mediastinal hematoma. No pneumomediastinum. Few scattered mediastinal lymph nodes that are not enlarged by size criteria. Limited assessment for hilar adenopathy in the absence of IV contrast. No esophageal wall thickening. Subcentimeter nodule in the left lobe of the thyroid gland, does not need further evaluation based on size. Lungs/Pleura: Small bilateral pleural effusions. Compressive atelectasis most prominent in the lower lobes.  Additional subsegmental atelectasis in the lingula and left upper lobe. No pneumothorax. No evidence of pulmonary contusion. No pulmonary mass. There is mild central bronchial thickening. Musculoskeletal: Remote left anterior first and second rib fractures. Remote anterior eighth, ninth, and tenth rib fractures with callus. Remote posterior tenth rib fracture. No evidence of acute rib fracture. Remote fractures of right anterior/lateral second through ninth ribs. No acute right rib fracture. No sternal fracture. No fracture of the thoracic spine, included clavicles or shoulder girdles. CT ABDOMEN PELVIS FINDINGS Hepatobiliary: Multiple hepatic cysts. No evidence of a Paddock injury or perihepatic hematoma. Layering hyperdensity in the gallbladder is likely sludge. No pericholecystic inflammation. No biliary dilatation. Pancreas: Pancreas is unremarkable. No peripancreatic inflammation. No evidence of pancreatic injury or pancreatic cyst. Spleen: Splenomegaly with spleen spanning 15.4 cm cranial caudal. No perisplenic hematoma or focal abnormality. Adrenals/Urinary Tract: No adrenal nodule. Polycystic kidneys which are enlarged with innumerable cysts of varying sizes. Transplant kidney in the right iliac  fossa. There is no transplant hydronephrosis. No evidence of renal injury. Urinary bladder is only minimally distended, partially obscured by streak artifact from left hip arthroplasty. No obvious focal bladder abnormality. Stomach/Bowel: Sigmoid colonic diverticulosis without diverticulitis. Mild colonic tortuosity. Small volume of colonic stool. Appendix is normal. Stomach and small bowel are unremarkable. No obstruction, inflammatory change, or evidence of injury. Vascular/Lymphatic: Aorto bi-iliac atherosclerosis. No aortic aneurysm. No retroperitoneal fluid. No adenopathy. Reproductive: Uterus partially obscured by streak artifact from left hip arthroplasty. No suspicious adnexal mass. Other: Small amount of fluid in the left inguinal canal is unchanged from 12/20/2019 CT. No ascites or free fluid in the abdomen. No free air. There is a small fat containing umbilical hernia. Musculoskeletal: Left hip arthroplasty. The bones are under mineralized. Remote left superior and inferior pubic rami fractures. Remote bilateral sacral insufficiency fractures. No evidence of acute fracture. IMPRESSION: 1. No evidence of acute traumatic injury in the chest, abdomen, or pelvis. 2. Small bilateral pleural effusions with compressive atelectasis. 3. Multi chamber cardiomegaly. Small circumferential pericardial effusion. Dilated main pulmonary artery suggesting pulmonary arterial hypertension. 4. No acute findings in the abdomen/pelvis. 5. Polycystic kidneys and liver. Transplant kidney in the right iliac fossa without transplant hydronephrosis. 6. Splenomegaly. 7. Colonic diverticulosis without diverticulitis. 8. Remote bilateral rib fractures.  No acute rib fracture. Aortic Atherosclerosis (ICD10-I70.0). Electronically Signed   By: Keith Rake M.D.   On: 04/15/2020 21:39   CT Head Wo Contrast  Result Date: 04/15/2020 CLINICAL DATA:  Status post trauma. EXAM: CT HEAD WITHOUT CONTRAST TECHNIQUE: Contiguous axial images  were obtained from the base of the skull through the vertex without intravenous contrast. COMPARISON:  Apr 19, 2019 FINDINGS: Brain: No evidence of acute infarction, hemorrhage, hydrocephalus, extra-axial collection or mass lesion/mass effect. Vascular: No hyperdense vessel or unexpected calcification. Skull: Normal. Negative for fracture or focal lesion. Sinuses/Orbits: No acute finding. Other: None. IMPRESSION: No acute intracranial pathology. Electronically Signed   By: Virgina Norfolk M.D.   On: 04/15/2020 21:44   CT Chest Wo Contrast  Result Date: 04/15/2020 CLINICAL DATA:  Worsening shortness of breath. Increased nausea and vomiting. Fever. Patient reports with recent fall with left-sided rib pain. EXAM: CT CHEST, ABDOMEN AND PELVIS WITHOUT CONTRAST TECHNIQUE: Multidetector CT imaging of the chest, abdomen and pelvis was performed following the standard protocol without IV contrast. COMPARISON:  Radiograph earlier this day. Most recent chest CT 07/27/2014, most recent abdominal CT 03/31/2019 FINDINGS: CT CHEST FINDINGS Cardiovascular: No periaortic stranding to suggest injury. Dilated main pulmonary  artery at 4.1 cm. Small circumferential pericardial effusion. Multi chamber cardiomegaly. There are prominent venous collaterals anterior to the left axilla and in the included left upper extremity. Decreased density of the blood pool suggesting anemia. Mediastinum/Nodes: No mediastinal hematoma. No pneumomediastinum. Few scattered mediastinal lymph nodes that are not enlarged by size criteria. Limited assessment for hilar adenopathy in the absence of IV contrast. No esophageal wall thickening. Subcentimeter nodule in the left lobe of the thyroid gland, does not need further evaluation based on size. Lungs/Pleura: Small bilateral pleural effusions. Compressive atelectasis most prominent in the lower lobes. Additional subsegmental atelectasis in the lingula and left upper lobe. No pneumothorax. No evidence of  pulmonary contusion. No pulmonary mass. There is mild central bronchial thickening. Musculoskeletal: Remote left anterior first and second rib fractures. Remote anterior eighth, ninth, and tenth rib fractures with callus. Remote posterior tenth rib fracture. No evidence of acute rib fracture. Remote fractures of right anterior/lateral second through ninth ribs. No acute right rib fracture. No sternal fracture. No fracture of the thoracic spine, included clavicles or shoulder girdles. CT ABDOMEN PELVIS FINDINGS Hepatobiliary: Multiple hepatic cysts. No evidence of a Paddock injury or perihepatic hematoma. Layering hyperdensity in the gallbladder is likely sludge. No pericholecystic inflammation. No biliary dilatation. Pancreas: Pancreas is unremarkable. No peripancreatic inflammation. No evidence of pancreatic injury or pancreatic cyst. Spleen: Splenomegaly with spleen spanning 15.4 cm cranial caudal. No perisplenic hematoma or focal abnormality. Adrenals/Urinary Tract: No adrenal nodule. Polycystic kidneys which are enlarged with innumerable cysts of varying sizes. 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 There is no transplant hydronephrosis. No evidence of renal injury. Urinary bladder is only minimally distended, partially obscured by streak artifact from left hip arthroplasty. No obvious focal bladder abnormality. Stomach/Bowel: Sigmoid colonic diverticulosis without diverticulitis. Mild colonic tortuosity. Small volume of colonic stool. Appendix is normal. Stomach and small bowel are unremarkable. No obstruction, inflammatory change, or evidence of injury. Vascular/Lymphatic: Aorto bi-iliac atherosclerosis. No aortic aneurysm. No retroperitoneal fluid. No adenopathy. Reproductive: Uterus partially obscured by streak artifact from left hip arthroplasty. No suspicious adnexal mass. Other: Small amount of fluid in the left inguinal canal is unchanged from 12/20/2019 CT. No ascites or free fluid in the abdomen.  No free air. There is a small fat containing umbilical hernia. Musculoskeletal: Left hip arthroplasty. The bones are under mineralized. Remote left superior and inferior pubic rami fractures. Remote bilateral sacral insufficiency fractures. No evidence of acute fracture. IMPRESSION: 1. No evidence of acute traumatic injury in the chest, abdomen, or pelvis. 2. Small bilateral pleural effusions with compressive atelectasis. 3. Multi chamber cardiomegaly. Small circumferential pericardial effusion. Dilated main pulmonary artery suggesting pulmonary arterial hypertension. 4. No acute findings in the abdomen/pelvis. 5. Polycystic kidneys and liver. Transplant kidney in the right iliac fossa without transplant hydronephrosis. 6. Splenomegaly. 7. Colonic diverticulosis without diverticulitis. 8. Remote bilateral rib fractures.  No acute rib fracture. Aortic Atherosclerosis (ICD10-I70.0). Electronically Signed   By: Keith Rake M.D.   On: 04/15/2020 21:39   CT Cervical Spine Wo Contrast  Result Date: 04/15/2020 CLINICAL DATA:  Status post trauma. EXAM: CT CERVICAL SPINE WITHOUT CONTRAST TECHNIQUE: Multidetector CT imaging of the cervical spine was performed without intravenous contrast. Multiplanar CT image reconstructions were also generated. COMPARISON:  None. FINDINGS: Alignment: Approximately 1 mm to 2 mm anterolisthesis of the C4 vertebral body is seen on C5. Skull base and vertebrae: No acute fracture. No primary bone lesion or focal pathologic process. Soft tissues and spinal canal: No prevertebral fluid  or swelling. No visible canal hematoma. Disc levels: Mild endplate sclerosis is seen at the level of C5-C6. Moderate severity intervertebral disc space narrowing is also seen at this level. Upper chest: Negative. Other: None. IMPRESSION: 1. No acute osseous abnormality. 2. Approximately 1 mm to 2 mm anterolisthesis of the C4 vertebral body on C5. 3. Moderate severity degenerative changes at the level of  C5-C6. Electronically Signed   By: Virgina Norfolk M.D.   On: 04/15/2020 21:26   CT L-SPINE NO CHARGE  Result Date: 04/15/2020 CLINICAL DATA:  Back pain. Recent fall. EXAM: CT LUMBAR SPINE WITHOUT CONTRAST TECHNIQUE: Multidetector CT imaging of the lumbar spine was performed without intravenous contrast administration. Multiplanar CT image reconstructions were also generated. COMPARISON:  Lumbar spine radiographs 12/19/2019. CT pelvis 12/20/2019. FINDINGS: Segmentation: 5 lumbar type vertebrae. Alignment: Normal. Vertebrae: Chronic bilateral sacral fractures with sclerosis and angular deformity of the sacrum anteriorly at the S2 level. Chronic bilateral L5 transverse process fractures. No acute lumbar spine fracture. Small Schmorl's nodes at T11-12 and T12-L1. Paraspinal and other soft tissues: No acute paraspinal soft tissue abnormality identified. Intra-abdominal and pelvic contents reported separately. Disc levels: Intervertebral disc space heights are preserved in the lumbar spine. Disc bulging and severe facet hypertrophy at L4-5 result in mild right greater than left neural foraminal stenosis without evidence of significant spinal stenosis. There is mild disc bulging at L3-4 with minimal to mild right neural foraminal stenosis. IMPRESSION: 1. No acute osseous abnormality identified in the lumbar spine. 2. Chronic bilateral sacral fractures and chronic bilateral L5 transverse process fractures. 3. Severe L4-5 facet hypertrophy with mild right greater than left neural foraminal stenosis. Electronically Signed   By: Logan Bores M.D.   On: 04/15/2020 21:32   DG Chest Portable 1 View  Result Date: 04/15/2020 CLINICAL DATA:  64 year old female with history of trauma from a fall complaining of left-sided rib pain. EXAM: PORTABLE CHEST 1 VIEW COMPARISON:  Chest x-ray 04/07/2020. FINDINGS: Lung volumes are low. Linear opacity in the left mid lung, new compared to the recent prior study, presumably a focus of  subsegmental atelectasis. No acute consolidative airspace disease. No pleural effusions. Cephalization of the pulmonary vasculature, without frank pulmonary edema. Heart size is mildly enlarged. Upper mediastinal contours are within normal limits. An old healed left-sided rib fractures are noted. No definite acute displaced rib fractures are identified. IMPRESSION: 1. Low lung volumes with new area of subsegmental atelectasis in the left mid lung. 2. Cardiomegaly with pulmonary venous congestion, but no frank pulmonary edema. 3. Multiple old healed left-sided rib fractures, similar to prior chest CT 07/27/2014. Electronically Signed   By: Vinnie Langton M.D.   On: 04/15/2020 19:23   DG Hip Unilat W or Wo Pelvis 2-3 Views Left  Result Date: 04/15/2020 CLINICAL DATA:  Status post fall 3 days ago. EXAM: DG HIP (WITH OR WITHOUT PELVIS) 2-3V LEFT COMPARISON:  February 01, 2020 FINDINGS: There is no evidence of an acute hip fracture or dislocation. Chronic fractures of the left superior and left inferior pubic rami are present. Fragmentation of the left greater trochanter is again noted. 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 There is no evidence of surrounding lucency to suggest the presence of hardware loosening or infection. There is no evidence of arthropathy or other focal bone abnormality. Radiopaque surgical clips are seen overlying the right acetabulum and inferior aspect of the sacrum on the right. IMPRESSION: 1. No evidence of an acute osseous abnormality. 2. Prior total left hip  replacement without evidence of hardware complication. Electronically Signed   By: Virgina Norfolk M.D.   On: 04/15/2020 21:38    Procedures .Critical Care Performed by: Cherre Robins, PA-C Authorized by: Cherre Robins, PA-C   Critical care provider statement:    Critical care time (minutes):  39   Critical care time was exclusive of:  Separately billable procedures and treating other patients and teaching  time   Critical care was necessary to treat or prevent imminent or life-threatening deterioration of the following conditions:  Sepsis   Critical care was time spent personally by me on the following activities:  Blood draw for specimens, development of treatment plan with patient or surrogate, discussions with consultants, evaluation of patient's response to treatment, examination of patient, obtaining history from patient or surrogate, ordering and performing treatments and interventions, ordering and review of laboratory studies, ordering and review of radiographic studies, pulse oximetry, re-evaluation of patient's condition and review of old charts   (including critical care time)  Medications Ordered in ED Medications  ceFEPIme (MAXIPIME) 2 g in sodium chloride 0.9 % 100 mL IVPB (has no administration in time range)  vancomycin (VANCOCIN) IVPB 1000 mg/200 mL premix (has no administration in time range)  lactated ringers bolus 1,000 mL (0 mLs Intravenous Stopped 04/15/20 2249)  ceFEPIme (MAXIPIME) 2 g in sodium chloride 0.9 % 100 mL IVPB (0 g Intravenous Stopped 04/15/20 2144)  metroNIDAZOLE (FLAGYL) IVPB 500 mg (0 mg Intravenous Stopped 04/15/20 2144)  promethazine (PHENERGAN) injection 12.5 mg (12.5 mg Intravenous Given 04/15/20 2012)  vancomycin (VANCOREADY) IVPB 1500 mg/300 mL (0 mg Intravenous Stopped 04/15/20 2249)  acetaminophen (TYLENOL) tablet 650 mg (650 mg Oral Given 04/15/20 2031)  lactated ringers bolus 1,000 mL (1,000 mLs Intravenous New Bag/Given 04/15/20 2323)    ED Course  I have reviewed the triage vital signs and the nursing notes.  Pertinent labs & imaging results that were available during my care of the patient were reviewed by me and considered in my medical decision making (see chart for details).    MDM Rules/Calculators/A&P                      History provided by patient and EMS with additional history obtained from chart review.   64 year old female  presenting with nausea and vomiting.  On arrival she was noted to be febrile to 102.7 and tachycardic.  Code sepsis was initiated.  Antibiotics given for unknown source.  Patient is normotensive.  Will give 1 L fluid bolus but hold off on 30 cc/kg sepsis fluids as she appears slightly volume overloaded on exam.  If lactic acidosis will add on fluids  Work-up is remarkable for leukocytosis of 22.9, hemoglobin 8.3 consistent with her baseline.  No severe electrolyte derangement, no renal insufficiency.  Lactic acid is within normal range. UA is positive for UTI, urine culture sent. Second liter of fluids added. Covid test is negative. Patient had recent fall CT head and neck were ordered.  They are negative for acute trauma.  Chest x-ray also negative for any rib fractures however given how tender patient was on exam CT chest also performed and is negative for any chest trauma.  Abdomen pelvis also performed as patient has nausea and vomiting and shows no acute findings.  X-ray of left hip shows the hardware is in place, no fractures or dislocations.This case was discussed with Dr. Darl Householder who has seen the patient and agrees with plan to admit.  Spoke with Dr. Cyd Silence with hospitalist service who agrees to assume care of patient and bring into the hospital for further evaluation and management.  I also spoke with patient's daughter and updated her on plan of care.    Portions of this note were generated with [employer]. Dictation errors may occur despite best attempts at proofreading.   Final Clinical Impression(s) / ED Diagnoses Final diagnoses:  Back pain  Sepsis without acute organ dysfunction, due to unspecified organism [facility])  Urinary tract infection without hematuria, site unspecified    Rx / DC Orders ED Discharge Orders    None       Flint Melter 04/15/20 2328    Drenda Freeze, MD 04/17/20 1606

## 2020-04-16 ENCOUNTER — Inpatient Hospital Stay (HOSPITAL_COMMUNITY): Payer: Medicare Other

## 2020-04-16 ENCOUNTER — Encounter (HOSPITAL_COMMUNITY): Payer: Self-pay | Admitting: Internal Medicine

## 2020-04-16 ENCOUNTER — Emergency Department (HOSPITAL_COMMUNITY): Payer: Medicare Other

## 2020-04-16 DIAGNOSIS — A4150 Gram-negative sepsis, unspecified: Secondary | ICD-10-CM | POA: Diagnosis not present

## 2020-04-16 DIAGNOSIS — Z20822 Contact with and (suspected) exposure to covid-19: Secondary | ICD-10-CM | POA: Diagnosis present

## 2020-04-16 DIAGNOSIS — K219 Gastro-esophageal reflux disease without esophagitis: Secondary | ICD-10-CM | POA: Diagnosis present

## 2020-04-16 DIAGNOSIS — N390 Urinary tract infection, site not specified: Secondary | ICD-10-CM

## 2020-04-16 DIAGNOSIS — I5033 Acute on chronic diastolic (congestive) heart failure: Secondary | ICD-10-CM | POA: Diagnosis present

## 2020-04-16 DIAGNOSIS — J9611 Chronic respiratory failure with hypoxia: Secondary | ICD-10-CM | POA: Diagnosis not present

## 2020-04-16 DIAGNOSIS — R0603 Acute respiratory distress: Secondary | ICD-10-CM | POA: Diagnosis present

## 2020-04-16 DIAGNOSIS — E785 Hyperlipidemia, unspecified: Secondary | ICD-10-CM | POA: Diagnosis present

## 2020-04-16 DIAGNOSIS — Z87891 Personal history of nicotine dependence: Secondary | ICD-10-CM | POA: Diagnosis not present

## 2020-04-16 DIAGNOSIS — J441 Chronic obstructive pulmonary disease with (acute) exacerbation: Secondary | ICD-10-CM | POA: Diagnosis present

## 2020-04-16 DIAGNOSIS — Z96642 Presence of left artificial hip joint: Secondary | ICD-10-CM | POA: Diagnosis present

## 2020-04-16 DIAGNOSIS — I5031 Acute diastolic (congestive) heart failure: Secondary | ICD-10-CM | POA: Diagnosis not present

## 2020-04-16 DIAGNOSIS — J9621 Acute and chronic respiratory failure with hypoxia: Secondary | ICD-10-CM | POA: Diagnosis present

## 2020-04-16 DIAGNOSIS — R809 Proteinuria, unspecified: Secondary | ICD-10-CM | POA: Diagnosis present

## 2020-04-16 DIAGNOSIS — I110 Hypertensive heart disease with heart failure: Secondary | ICD-10-CM | POA: Diagnosis present

## 2020-04-16 DIAGNOSIS — D638 Anemia in other chronic diseases classified elsewhere: Secondary | ICD-10-CM | POA: Diagnosis present

## 2020-04-16 DIAGNOSIS — A4159 Other Gram-negative sepsis: Secondary | ICD-10-CM | POA: Diagnosis present

## 2020-04-16 DIAGNOSIS — I3139 Other pericardial effusion (noninflammatory): Secondary | ICD-10-CM | POA: Diagnosis present

## 2020-04-16 DIAGNOSIS — Z8271 Family history of polycystic kidney: Secondary | ICD-10-CM | POA: Diagnosis not present

## 2020-04-16 DIAGNOSIS — W010XXA Fall on same level from slipping, tripping and stumbling without subsequent striking against object, initial encounter: Secondary | ICD-10-CM | POA: Diagnosis present

## 2020-04-16 DIAGNOSIS — I313 Pericardial effusion (noninflammatory): Secondary | ICD-10-CM | POA: Diagnosis present

## 2020-04-16 DIAGNOSIS — N179 Acute kidney failure, unspecified: Secondary | ICD-10-CM | POA: Diagnosis not present

## 2020-04-16 DIAGNOSIS — E213 Hyperparathyroidism, unspecified: Secondary | ICD-10-CM | POA: Diagnosis present

## 2020-04-16 DIAGNOSIS — Z940 Kidney transplant status: Secondary | ICD-10-CM | POA: Diagnosis not present

## 2020-04-16 DIAGNOSIS — M1990 Unspecified osteoarthritis, unspecified site: Secondary | ICD-10-CM | POA: Diagnosis present

## 2020-04-16 DIAGNOSIS — R0602 Shortness of breath: Secondary | ICD-10-CM | POA: Diagnosis present

## 2020-04-16 DIAGNOSIS — B961 Klebsiella pneumoniae [K. pneumoniae] as the cause of diseases classified elsewhere: Secondary | ICD-10-CM | POA: Diagnosis present

## 2020-04-16 DIAGNOSIS — Q613 Polycystic kidney, unspecified: Secondary | ICD-10-CM | POA: Diagnosis not present

## 2020-04-16 DIAGNOSIS — Z8249 Family history of ischemic heart disease and other diseases of the circulatory system: Secondary | ICD-10-CM | POA: Diagnosis not present

## 2020-04-16 DIAGNOSIS — F329 Major depressive disorder, single episode, unspecified: Secondary | ICD-10-CM | POA: Diagnosis present

## 2020-04-16 DIAGNOSIS — I471 Supraventricular tachycardia: Secondary | ICD-10-CM | POA: Diagnosis present

## 2020-04-16 LAB — TROPONIN I (HIGH SENSITIVITY): Troponin I (High Sensitivity): 18 ng/L — ABNORMAL HIGH (ref <18)

## 2020-04-16 LAB — POCT I-STAT EG7
Acid-Base Excess: 3 mmol/L — ABNORMAL HIGH (ref 0.0–2.0)
Bicarbonate: 28.4 mmol/L — ABNORMAL HIGH (ref 20.0–28.0)
Calcium, Ion: 1.34 mmol/L (ref 1.15–1.40)
HCT: 25 % — ABNORMAL LOW (ref 36.0–46.0)
Hemoglobin: 8.5 g/dL — ABNORMAL LOW (ref 12.0–15.0)
O2 Saturation: 85 %
Potassium: 4.3 mmol/L (ref 3.5–5.1)
Sodium: 136 mmol/L (ref 135–145)
TCO2: 30 mmol/L (ref 22–32)
pCO2, Ven: 48 mmHg (ref 44.0–60.0)
pH, Ven: 7.38 (ref 7.250–7.430)
pO2, Ven: 52 mmHg — ABNORMAL HIGH (ref 32.0–45.0)

## 2020-04-16 LAB — CBC WITH DIFFERENTIAL/PLATELET
Abs Immature Granulocytes: 0.24 10*3/uL — ABNORMAL HIGH (ref 0.00–0.07)
Basophils Absolute: 0.1 10*3/uL (ref 0.0–0.1)
Basophils Relative: 0 %
Eosinophils Absolute: 0 10*3/uL (ref 0.0–0.5)
Eosinophils Relative: 0 %
HCT: 27.9 % — ABNORMAL LOW (ref 36.0–46.0)
Hemoglobin: 8.2 g/dL — ABNORMAL LOW (ref 12.0–15.0)
Immature Granulocytes: 1 %
Lymphocytes Relative: 2 %
Lymphs Abs: 0.5 10*3/uL — ABNORMAL LOW (ref 0.7–4.0)
MCH: 29.6 pg (ref 26.0–34.0)
MCHC: 29.4 g/dL — ABNORMAL LOW (ref 30.0–36.0)
MCV: 100.7 fL — ABNORMAL HIGH (ref 80.0–100.0)
Monocytes Absolute: 0.3 10*3/uL (ref 0.1–1.0)
Monocytes Relative: 1 %
Neutro Abs: 23.8 10*3/uL — ABNORMAL HIGH (ref 1.7–7.7)
Neutrophils Relative %: 96 %
Platelets: 219 10*3/uL (ref 150–400)
RBC: 2.77 MIL/uL — ABNORMAL LOW (ref 3.87–5.11)
RDW: 24.3 % — ABNORMAL HIGH (ref 11.5–15.5)
WBC: 24.9 10*3/uL — ABNORMAL HIGH (ref 4.0–10.5)
nRBC: 0 % (ref 0.0–0.2)

## 2020-04-16 LAB — COMPREHENSIVE METABOLIC PANEL
ALT: 15 U/L (ref 0–44)
AST: 26 U/L (ref 15–41)
Albumin: 2.3 g/dL — ABNORMAL LOW (ref 3.5–5.0)
Alkaline Phosphatase: 79 U/L (ref 38–126)
Anion gap: 14 (ref 5–15)
BUN: 24 mg/dL — ABNORMAL HIGH (ref 8–23)
CO2: 25 mmol/L (ref 22–32)
Calcium: 9.3 mg/dL (ref 8.9–10.3)
Chloride: 99 mmol/L (ref 98–111)
Creatinine, Ser: 0.86 mg/dL (ref 0.44–1.00)
GFR calc Af Amer: >60 mL/min (ref >60)
GFR calc non Af Amer: >60 mL/min (ref >60)
Glucose, Bld: 149 mg/dL — ABNORMAL HIGH (ref 70–99)
Potassium: 4.6 mmol/L (ref 3.5–5.1)
Sodium: 138 mmol/L (ref 135–145)
Total Bilirubin: 2.1 mg/dL — ABNORMAL HIGH (ref 0.3–1.2)
Total Protein: 7.2 g/dL (ref 6.5–8.1)

## 2020-04-16 LAB — PROTIME-INR
INR: 1.4 — ABNORMAL HIGH (ref 0.8–1.2)
Prothrombin Time: 17 seconds — ABNORMAL HIGH (ref 11.4–15.2)

## 2020-04-16 LAB — TSH: TSH: 2.809 u[IU]/mL (ref 0.350–4.500)

## 2020-04-16 LAB — C-REACTIVE PROTEIN: CRP: 31.1 mg/dL — ABNORMAL HIGH (ref <1.0)

## 2020-04-16 LAB — MAGNESIUM: Magnesium: 1.5 mg/dL — ABNORMAL LOW (ref 1.7–2.4)

## 2020-04-16 LAB — SEDIMENTATION RATE: Sed Rate: 133 mm/hr — ABNORMAL HIGH (ref 0–22)

## 2020-04-16 LAB — APTT: aPTT: 40 seconds — ABNORMAL HIGH (ref 24–36)

## 2020-04-16 LAB — BRAIN NATRIURETIC PEPTIDE: B Natriuretic Peptide: 2790.5 pg/mL — ABNORMAL HIGH (ref 0.0–100.0)

## 2020-04-16 MED ORDER — VALACYCLOVIR HCL 500 MG PO TABS
500.0000 mg | ORAL_TABLET | Freq: Two times a day (BID) | ORAL | Status: DC
  Administered 2020-04-17 – 2020-04-19 (×5): 500 mg via ORAL
  Filled 2020-04-16 (×7): qty 1

## 2020-04-16 MED ORDER — ACETAMINOPHEN 325 MG PO TABS: 650.0000 mg | ORAL_TABLET | ORAL | Status: DC | PRN

## 2020-04-16 MED ORDER — ONDANSETRON HCL 4 MG/2ML IJ SOLN
4.0000 mg | Freq: Four times a day (QID) | INTRAMUSCULAR | Status: DC | PRN
  Administered 2020-04-16: 4 mg via INTRAVENOUS
  Filled 2020-04-16 (×2): qty 2

## 2020-04-16 MED ORDER — SODIUM CHLORIDE 0.9% FLUSH
3.0000 mL | Freq: Two times a day (BID) | INTRAVENOUS | Status: DC
  Administered 2020-04-16 – 2020-04-19 (×6): 3 mL via INTRAVENOUS

## 2020-04-16 MED ORDER — PANTOPRAZOLE SODIUM 40 MG PO TBEC
40.0000 mg | DELAYED_RELEASE_TABLET | Freq: Two times a day (BID) | ORAL | Status: DC
  Administered 2020-04-17 – 2020-04-19 (×5): 40 mg via ORAL
  Filled 2020-04-16 (×6): qty 1

## 2020-04-16 MED ORDER — ENOXAPARIN SODIUM 40 MG/0.4ML ~~LOC~~ SOLN
40.0000 mg | SUBCUTANEOUS | Status: DC
  Administered 2020-04-16 – 2020-04-19 (×4): 40 mg via SUBCUTANEOUS
  Filled 2020-04-16 (×4): qty 0.4

## 2020-04-16 MED ORDER — IPRATROPIUM BROMIDE 0.02 % IN SOLN: 0.5000 mg | Freq: Four times a day (QID) | RESPIRATORY_TRACT | Status: DC | PRN

## 2020-04-16 MED ORDER — BRIMONIDINE TARTRATE 0.15 % OP SOLN
1.0000 [drp] | Freq: Two times a day (BID) | OPHTHALMIC | Status: DC
  Administered 2020-04-16 – 2020-04-19 (×7): 1 [drp] via OPHTHALMIC
  Filled 2020-04-16: qty 5

## 2020-04-16 MED ORDER — LEVALBUTEROL HCL 1.25 MG/0.5ML IN NEBU
1.2500 mg | INHALATION_SOLUTION | Freq: Three times a day (TID) | RESPIRATORY_TRACT | Status: DC
  Administered 2020-04-16 (×3): 1.25 mg via RESPIRATORY_TRACT
  Filled 2020-04-16 (×2): qty 0.5

## 2020-04-16 MED ORDER — VITAMIN B-12 1000 MCG PO TABS
1000.0000 ug | ORAL_TABLET | Freq: Every day | ORAL | Status: DC
  Administered 2020-04-17 – 2020-04-19 (×3): 1000 ug via ORAL
  Filled 2020-04-16 (×4): qty 1

## 2020-04-16 MED ORDER — IPRATROPIUM-ALBUTEROL 0.5-2.5 (3) MG/3ML IN SOLN
3.0000 mL | Freq: Four times a day (QID) | RESPIRATORY_TRACT | Status: DC
  Administered 2020-04-16: 3 mL via RESPIRATORY_TRACT
  Filled 2020-04-16 (×2): qty 3

## 2020-04-16 MED ORDER — ENSURE ENLIVE PO LIQD
237.0000 mL | Freq: Two times a day (BID) | ORAL | Status: DC
  Administered 2020-04-17 – 2020-04-19 (×4): 237 mL via ORAL
  Filled 2020-04-16: qty 237

## 2020-04-16 MED ORDER — MYCOPHENOLATE SODIUM 180 MG PO TBEC
180.0000 mg | DELAYED_RELEASE_TABLET | Freq: Two times a day (BID) | ORAL | Status: DC
  Administered 2020-04-17 – 2020-04-19 (×5): 180 mg via ORAL
  Filled 2020-04-16 (×8): qty 1

## 2020-04-16 MED ORDER — ASPIRIN 81 MG PO CHEW
81.0000 mg | CHEWABLE_TABLET | Freq: Every day | ORAL | Status: DC
  Administered 2020-04-17 – 2020-04-19 (×3): 81 mg via ORAL
  Filled 2020-04-16 (×4): qty 1

## 2020-04-16 MED ORDER — TACROLIMUS 1 MG PO CAPS
1.0000 mg | ORAL_CAPSULE | Freq: Every day | ORAL | Status: DC
  Administered 2020-04-17 – 2020-04-19 (×3): 1 mg via ORAL
  Filled 2020-04-16 (×5): qty 1

## 2020-04-16 MED ORDER — SODIUM CHLORIDE 0.9 % IV SOLN: 250.0000 mL | INTRAVENOUS | Status: DC | PRN

## 2020-04-16 MED ORDER — METHYLPREDNISOLONE SODIUM SUCC 40 MG IJ SOLR
40.0000 mg | Freq: Three times a day (TID) | INTRAMUSCULAR | Status: DC
  Administered 2020-04-16 – 2020-04-17 (×3): 40 mg via INTRAVENOUS
  Filled 2020-04-16 (×4): qty 1

## 2020-04-16 MED ORDER — PRO-STAT SUGAR FREE PO LIQD
30.0000 mL | Freq: Two times a day (BID) | ORAL | Status: DC
  Filled 2020-04-16 (×6): qty 30

## 2020-04-16 MED ORDER — SENNOSIDES-DOCUSATE SODIUM 8.6-50 MG PO TABS: 2.0000 | ORAL_TABLET | Freq: Every evening | ORAL | Status: DC | PRN

## 2020-04-16 MED ORDER — IPRATROPIUM-ALBUTEROL 0.5-2.5 (3) MG/3ML IN SOLN
3.0000 mL | RESPIRATORY_TRACT | Status: DC | PRN
  Administered 2020-04-16: 3 mL via RESPIRATORY_TRACT

## 2020-04-16 MED ORDER — SODIUM CHLORIDE 0.9% FLUSH: 3.0000 mL | INTRAVENOUS | Status: DC | PRN

## 2020-04-16 MED ORDER — FUROSEMIDE 10 MG/ML IJ SOLN
40.0000 mg | Freq: Two times a day (BID) | INTRAMUSCULAR | Status: DC
  Administered 2020-04-16 – 2020-04-17 (×4): 40 mg via INTRAVENOUS
  Filled 2020-04-16 (×4): qty 4

## 2020-04-16 MED ORDER — ALLOPURINOL 300 MG PO TABS
300.0000 mg | ORAL_TABLET | Freq: Every day | ORAL | Status: DC
  Administered 2020-04-17 – 2020-04-19 (×3): 300 mg via ORAL
  Filled 2020-04-16 (×4): qty 1

## 2020-04-16 MED ORDER — BUDESONIDE 0.5 MG/2ML IN SUSP
0.5000 mg | Freq: Two times a day (BID) | RESPIRATORY_TRACT | Status: DC
  Administered 2020-04-16 – 2020-04-19 (×5): 0.5 mg via RESPIRATORY_TRACT
  Filled 2020-04-16 (×7): qty 2

## 2020-04-16 MED ORDER — FERROUS SULFATE 325 (65 FE) MG PO TABS
325.0000 mg | ORAL_TABLET | Freq: Every day | ORAL | Status: DC
  Administered 2020-04-17 – 2020-04-19 (×3): 325 mg via ORAL
  Filled 2020-04-16 (×4): qty 1

## 2020-04-16 MED ORDER — FUROSEMIDE 20 MG PO TABS: 40.0000 mg | ORAL_TABLET | Freq: Every day | ORAL | Status: DC

## 2020-04-16 MED ORDER — CYCLOSPORINE 0.05 % OP EMUL
1.0000 [drp] | Freq: Two times a day (BID) | OPHTHALMIC | Status: DC
  Administered 2020-04-16 – 2020-04-19 (×7): 1 [drp] via OPHTHALMIC
  Filled 2020-04-16 (×8): qty 1

## 2020-04-16 MED ORDER — ASCORBIC ACID 500 MG PO TABS
500.0000 mg | ORAL_TABLET | Freq: Every day | ORAL | Status: DC
  Administered 2020-04-17 – 2020-04-19 (×3): 500 mg via ORAL
  Filled 2020-04-16 (×4): qty 1

## 2020-04-16 MED ORDER — TACROLIMUS 0.5 MG PO CAPS
0.5000 mg | ORAL_CAPSULE | Freq: Every day | ORAL | Status: DC
  Administered 2020-04-17 – 2020-04-18 (×2): 0.5 mg via ORAL
  Filled 2020-04-16 (×4): qty 1

## 2020-04-16 MED ORDER — VERAPAMIL HCL ER 240 MG PO TBCR
240.0000 mg | EXTENDED_RELEASE_TABLET | Freq: Every day | ORAL | Status: DC
  Administered 2020-04-17 – 2020-04-19 (×3): 240 mg via ORAL
  Filled 2020-04-16 (×4): qty 1

## 2020-04-16 MED ORDER — VITAMIN D 25 MCG (1000 UNIT) PO TABS
1000.0000 [IU] | ORAL_TABLET | Freq: Every day | ORAL | Status: DC
  Administered 2020-04-17 – 2020-04-19 (×3): 1000 [IU] via ORAL
  Filled 2020-04-16 (×4): qty 1

## 2020-04-16 MED ORDER — ADULT MULTIVITAMIN W/MINERALS CH
1.0000 | ORAL_TABLET | Freq: Every day | ORAL | Status: DC
  Administered 2020-04-17 – 2020-04-19 (×3): 1 via ORAL
  Filled 2020-04-16 (×4): qty 1

## 2020-04-16 MED ORDER — GABAPENTIN 100 MG PO CAPS
200.0000 mg | ORAL_CAPSULE | Freq: Three times a day (TID) | ORAL | Status: DC
  Administered 2020-04-17 – 2020-04-19 (×8): 200 mg via ORAL
  Filled 2020-04-16 (×8): qty 2

## 2020-04-16 MED ORDER — PROMETHAZINE HCL 25 MG/ML IJ SOLN
12.5000 mg | Freq: Four times a day (QID) | INTRAMUSCULAR | Status: DC | PRN
  Administered 2020-04-16: 12.5 mg via INTRAVENOUS
  Filled 2020-04-16: qty 1

## 2020-04-16 MED ORDER — DOCUSATE SODIUM 100 MG PO CAPS
100.0000 mg | ORAL_CAPSULE | Freq: Two times a day (BID) | ORAL | Status: DC
  Administered 2020-04-18 – 2020-04-19 (×2): 100 mg via ORAL
  Filled 2020-04-16 (×4): qty 1

## 2020-04-16 MED ORDER — METHYLPREDNISOLONE SODIUM SUCC 40 MG IJ SOLR
40.0000 mg | Freq: Two times a day (BID) | INTRAMUSCULAR | Status: DC
  Administered 2020-04-16: 40 mg via INTRAVENOUS
  Filled 2020-04-16: qty 1

## 2020-04-16 MED ORDER — MAGNESIUM OXIDE 400 (241.3 MG) MG PO TABS
400.0000 mg | ORAL_TABLET | Freq: Two times a day (BID) | ORAL | Status: DC
  Administered 2020-04-17 – 2020-04-19 (×5): 400 mg via ORAL
  Filled 2020-04-16 (×6): qty 1

## 2020-04-16 MED ORDER — DM-GUAIFENESIN ER 30-600 MG PO TB12
1.0000 | ORAL_TABLET | Freq: Two times a day (BID) | ORAL | Status: DC | PRN
  Filled 2020-04-16: qty 1

## 2020-04-16 MED ORDER — HYDROXYZINE HCL 10 MG PO TABS
10.0000 mg | ORAL_TABLET | Freq: Two times a day (BID) | ORAL | Status: DC | PRN
  Administered 2020-04-17 – 2020-04-18 (×2): 10 mg via ORAL
  Filled 2020-04-16 (×3): qty 1

## 2020-04-16 MED ORDER — LEVALBUTEROL HCL 1.25 MG/0.5ML IN NEBU
INHALATION_SOLUTION | RESPIRATORY_TRACT | Status: AC
  Filled 2020-04-16: qty 0.5

## 2020-04-16 MED ORDER — FLUOXETINE HCL 20 MG PO CAPS
40.0000 mg | ORAL_CAPSULE | Freq: Every day | ORAL | Status: DC
  Administered 2020-04-17 – 2020-04-19 (×3): 40 mg via ORAL
  Filled 2020-04-16 (×4): qty 2

## 2020-04-16 MED ORDER — POLYETHYLENE GLYCOL 3350 17 G PO PACK: 17.0000 g | PACK | Freq: Every day | ORAL | Status: DC | PRN

## 2020-04-16 MED ORDER — LATANOPROST 0.005 % OP SOLN
1.0000 [drp] | Freq: Every day | OPHTHALMIC | Status: DC
  Administered 2020-04-16 – 2020-04-18 (×3): 1 [drp] via OPHTHALMIC
  Filled 2020-04-16: qty 2.5

## 2020-04-16 MED ORDER — PROMETHAZINE HCL 25 MG/ML IJ SOLN
12.5000 mg | Freq: Once | INTRAMUSCULAR | Status: AC
  Administered 2020-04-16: 12.5 mg via INTRAVENOUS
  Filled 2020-04-16: qty 1

## 2020-04-16 MED ORDER — DORZOLAMIDE HCL 2 % OP SOLN
1.0000 [drp] | Freq: Every day | OPHTHALMIC | Status: DC
  Administered 2020-04-16 – 2020-04-19 (×4): 1 [drp] via OPHTHALMIC
  Filled 2020-04-16: qty 10

## 2020-04-16 NOTE — ED Notes (Addendum)
RT at bedside unable to obtain ABG. Dr. Cyd Silence informed

## 2020-04-16 NOTE — Progress Notes (Addendum)
PROGRESS NOTE    April [name]  LJQ:[id number] DOB: 1956/06/18 DOA: 04/15/2020 PCP: [name], MD   Brief Narrative:  64 year old female with past medical history of polycystic kidney disease status post right kidney transplant, COPD, diastolic congestive heart failure and chronic hypoxic respiratory failure on 2 L of oxygen via nasal cannula, hypertension, anxiety disorder, obesity who presents to [facility] emergency department with complaints of shortness of breath nausea and vomiting.  Recurrent admissions for COPD exacerbation.  She was found to be febrile with suggestion of urinary tract infection.  Started on broad-spectrum antibiotics.    Assessment & Plan:   Principal Problem:   Sepsis due to gram-negative UTI ([place]) Active Problems:   GERD without esophagitis   Renal transplant recipient   Acute on chronic diastolic CHF (congestive heart failure) (HCC)   Anemia of chronic disease   COPD exacerbation (HCC)   Pericardial effusion   Chronic respiratory failure with hypoxia (HCC)   Proteinuria   Acute respiratory distress   Sepsis secondary to urinary tract infection -Follow-up culture data. -Empiric IV cefepime -Supportive care.  Acute on chronic hypoxic respiratory failure, 2 L nasal cannula at baseline Acute COPD exacerbation -We will need to place patient on BiPAP, transferred to progressive care unit -Solu-Medrol IV, scheduled and as needed bronchodilators -ABG.  Acute diastolic congestive heart failure -Currently on Lasix every 12 hours -Strict input and output.  Monitor electrolytes.  Pericardial effusion -No evidence of tamponade. -Echocardiogram ordered  GERD without esophagitis-continue PPI  Anemia of chronic disease, macrocytic Monitor Hb for now  History of renal transplant -On tacrolimus, mycophenolate.  History of SVT -Verapamil   DVT prophylaxis: Lovenox Code Status: Full code Family Communication: None  Status is:  Inpatient  Remains inpatient appropriate because:Hemodynamically unstable   Dispo: The patient is from: Home              Anticipated d/c is to: Home              Anticipated d/c date is: 3 days              Patient currently is not medically stable to d/c.  Patient is quite dyspneic requiring BiPAP.  Unsafe for discharge.    Subjective: Called by RN as patient was getting more dyspneic this morning.  When I arrived at bedside she had diffuse diminished breath sounds.  She was placed on BiPAP and given bronchodilator treatment.  Review of Systems Otherwise negative except as per HPI, including: General: Denies fever, chills, night sweats or unintended weight loss. Resp: Denies hemoptysis Cardiac: Denies chest pain, palpitations, orthopnea, paroxysmal nocturnal dyspnea. GI: Denies abdominal pain, nausea, vomiting, diarrhea or constipation GU: Denies dysuria, frequency, hesitancy or incontinence MS: Denies muscle aches, joint pain or swelling Neuro: Denies headache, neurologic deficits (focal weakness, numbness, tingling), abnormal gait Psych: Denies anxiety, depression, SI/HI/AVH Skin: Denies new rashes or lesions ID: Denies sick contacts, exotic exposures, travel  Examination:  General exam: In mild distress due to shortness of breath Respiratory system: Diffuse bilateral diminished breath sounds with mild expiratory wheezing.  Some use of accessory muscles. Cardiovascular system: S1 & S2 heard, RRR. No JVD, murmurs, rubs, gallops or clicks. No pedal edema. Gastrointestinal system: Abdomen is nondistended, soft and nontender. No organomegaly or masses felt. Normal bowel sounds heard. Central nervous system: Alert and oriented. No focal neurological deficits. Extremities: Symmetric 5 x 5 power. Skin: No rashes, lesions or ulcers Psychiatry: Judgement and insight appear normal. Mood & affect  appropriate.     Objective: Vitals:   04/16/20 1145 04/16/20 1200 04/16/20 1230  04/16/20 1300  BP:  129/72 122/73 121/71  Pulse: 92 94 90 94  Resp: (!) 25 (!) 21 (!) 24 (!) 23  Temp:      TempSrc:      SpO2: 98% 99% 99% 99%  Weight:      Height:        Intake/Output Summary (Last 24 hours) at 04/16/2020 1359 Last data filed at 04/16/2020 0401 Gross per 24 hour  Intake --  Output 300 ml  Net -300 ml   Filed Weights   04/15/20 1921  Weight: 74.8 kg     Data Reviewed:   CBC: Recent Labs  Lab 04/15/20 2005 04/16/20 0608 04/16/20 0943  WBC 22.9* 24.9*  --   NEUTROABS 20.4* 23.8*  --   HGB 8.3* 8.2* 8.5*  HCT 28.1* 27.9* 25.0*  MCV 100.7* 100.7*  --   PLT 234 219  --    Basic Metabolic Panel: Recent Labs  Lab 04/15/20 2005 04/16/20 0608 04/16/20 0943  NA 134* 138 136  K 4.1 4.6 4.3  CL 98 99  --   CO2 26 25  --   GLUCOSE 136* 149*  --   BUN 21 24*  --   CREATININE 0.77 0.86  --   CALCIUM 9.5 9.3  --   MG  --  1.5*  --    GFR: Estimated Creatinine Clearance: 62.7 mL/min (by C-G formula based on SCr of 0.86 mg/dL). Liver Function Tests: Recent Labs  Lab 04/15/20 2005 04/16/20 0608  AST 21 26  ALT 14 15  ALKPHOS 84 79  BILITOT 1.2 2.1*  PROT 7.9 7.2  ALBUMIN 2.5* 2.3*   No results for input(s): LIPASE, AMYLASE in the last 168 hours. No results for input(s): AMMONIA in the last 168 hours. Coagulation Profile: Recent Labs  Lab 04/15/20 2005 04/16/20 3151  INR 1.4* 1.4*   Cardiac Enzymes: No results for input(s): CKTOTAL, CKMB, CKMBINDEX, TROPONINI in the last 168 hours. BNP (last 3 results) No results for input(s): PROBNP in the last 8760 hours. HbA1C: No results for input(s): HGBA1C in the last 72 hours. CBG: Recent Labs  Lab 04/10/20 1633 04/10/20 2109 04/11/20 0733 04/11/20 1123 04/11/20 2047  GLUCAP 122* 168* 96 139* 115*   Lipid Profile: No results for input(s): CHOL, HDL, LDLCALC, TRIG, CHOLHDL, LDLDIRECT in the last 72 hours. Thyroid Function Tests: Recent Labs    04/16/20 0608  TSH 2.809   Anemia  Panel: No results for input(s): VITAMINB12, FOLATE, FERRITIN, TIBC, IRON, RETICCTPCT in the last 72 hours. Sepsis Labs: Recent Labs  Lab 04/15/20 2005 04/15/20 2254  LATICACIDVEN 1.1 1.0    Recent Results (from the past 240 hour(s))  SARS Coronavirus 2 by RT PCR (hospital order, performed in [facility] hospital lab) Nasopharyngeal Nasopharyngeal Swab     Status: None   Collection Time: 04/07/20  5:48 AM   Specimen: Nasopharyngeal Swab  Result Value Ref Range Status   SARS Coronavirus 2 NEGATIVE NEGATIVE Final    Comment: (NOTE) SARS-CoV-2 target nucleic acids are NOT DETECTED. The SARS-CoV-2 RNA is generally detectable in upper and lower respiratory specimens during the acute phase of infection. The lowest concentration of SARS-CoV-2 viral copies this assay can detect is 250 copies / mL. A negative result does not preclude SARS-CoV-2 infection and should not be used as the sole basis for treatment or other patient management decisions.  A negative  result may occur with improper specimen collection / handling, submission of specimen other than nasopharyngeal swab, presence of viral mutation(s) within the areas targeted by this assay, and inadequate number of viral copies (<250 copies / mL). A negative result must be combined with clinical observations, patient history, and epidemiological information. Fact Sheet for Patients:   StrictlyIdeas.no Fact Sheet for Healthcare Providers: BankingDealers.co.za This test is not yet approved or cleared  by the [place] FDA and has been authorized for detection and/or diagnosis of SARS-CoV-2 by FDA under an Emergency Use Authorization (EUA).  This EUA will remain in effect (meaning this test can be used) for the duration of the COVID-19 declaration under Section 564(b)(1) of the Act, 21 U.S.C. section 360bbb-3(b)(1), unless the authorization is terminated or revoked sooner. Performed at  [facility], [place] [address] [place], [place] [address]   SARS Coronavirus 2 by RT PCR (hospital order, performed in [facility] hospital lab) Nasopharyngeal Nasopharyngeal Swab     Status: None   Collection Time: 04/11/20 11:23 AM   Specimen: Nasopharyngeal Swab  Result Value Ref Range Status   SARS Coronavirus 2 NEGATIVE NEGATIVE Final    Comment: (NOTE) SARS-CoV-2 target nucleic acids are NOT DETECTED. The SARS-CoV-2 RNA is generally detectable in upper and lower respiratory specimens during the acute phase of infection. The lowest concentration of SARS-CoV-2 viral copies this assay can detect is 250 copies / mL. A negative result does not preclude SARS-CoV-2 infection and should not be used as the sole basis for treatment or other patient management decisions.  A negative result may occur with improper specimen collection / handling, submission of specimen other than nasopharyngeal swab, presence of viral mutation(s) within the areas targeted by this assay, and inadequate number of viral copies (<250 copies / mL). A negative result must be combined with clinical observations, patient history, and epidemiological information. Fact Sheet for Patients:   StrictlyIdeas.no Fact Sheet for Healthcare Providers: BankingDealers.co.za This test is not yet approved or cleared  by the [place] FDA and has been authorized for detection and/or diagnosis of SARS-CoV-2 by FDA under an Emergency Use Authorization (EUA).  This EUA will remain in effect (meaning this test can be used) for the duration of the COVID-19 declaration under Section 564(b)(1) of the Act, 21 U.S.C. section 360bbb-3(b)(1), unless the authorization is terminated or revoked sooner. Performed at [facility], [place] [address], [place], [place] [address]   SARS Coronavirus 2 by RT PCR (hospital order, performed in [facility] hospital lab) Nasopharyngeal  Nasopharyngeal Swab     Status: None   Collection Time: 04/15/20  8:25 PM   Specimen: Nasopharyngeal Swab  Result Value Ref Range Status   SARS Coronavirus 2 NEGATIVE NEGATIVE Final    Comment: (NOTE) SARS-CoV-2 target nucleic acids are NOT DETECTED. The SARS-CoV-2 RNA is generally detectable in upper and lower respiratory specimens during the acute phase of infection. The lowest concentration of SARS-CoV-2 viral copies this assay can detect is 250 copies / mL. A negative result does not preclude SARS-CoV-2 infection and should not be used as the sole basis for treatment or other patient management decisions.  A negative result may occur with improper specimen collection / handling, submission of specimen other than nasopharyngeal swab, presence of viral mutation(s) within the areas targeted by this assay, and inadequate number of viral copies (<250 copies / mL). A negative result must be combined with clinical observations, patient history, and epidemiological information. Fact Sheet for Patients:  StrictlyIdeas.no Fact Sheet for Healthcare Providers: BankingDealers.co.za This test is not yet approved or cleared  by the [place] FDA and has been authorized for detection and/or diagnosis of SARS-CoV-2 by FDA under an Emergency Use Authorization (EUA).  This EUA will remain in effect (meaning this test can be used) for the duration of the COVID-19 declaration under Section 564(b)(1) of the Act, 21 U.S.C. section 360bbb-3(b)(1), unless the authorization is terminated or revoked sooner. Performed at [facility], [place] [address] [place], [place] [address]          Radiology Studies: CT ABDOMEN PELVIS WO CONTRAST  Result Date: 04/15/2020 CLINICAL DATA:  Worsening shortness of breath. Increased nausea and vomiting. Fever. Patient reports with recent fall with left-sided rib pain. EXAM: CT CHEST, ABDOMEN AND PELVIS WITHOUT  CONTRAST TECHNIQUE: Multidetector CT imaging of the chest, abdomen and pelvis was performed following the standard protocol without IV contrast. COMPARISON:  Radiograph earlier this day. Most recent chest CT 07/27/2014, most recent abdominal CT 03/31/2019 FINDINGS: CT CHEST FINDINGS Cardiovascular: No periaortic stranding to suggest injury. Dilated main pulmonary artery at 4.1 cm. Small circumferential pericardial effusion. Multi chamber cardiomegaly. There are prominent venous collaterals anterior to the left axilla and in the included left upper extremity. Decreased density of the blood pool suggesting anemia. Mediastinum/Nodes: No mediastinal hematoma. No pneumomediastinum. Few scattered mediastinal lymph nodes that are not enlarged by size criteria. Limited assessment for hilar adenopathy in the absence of IV contrast. No esophageal wall thickening. Subcentimeter nodule in the left lobe of the thyroid gland, does not need further evaluation based on size. Lungs/Pleura: Small bilateral pleural effusions. Compressive atelectasis most prominent in the lower lobes. Additional subsegmental atelectasis in the lingula and left upper lobe. No pneumothorax. No evidence of pulmonary contusion. No pulmonary mass. There is mild central bronchial thickening. Musculoskeletal: Remote left anterior first and second rib fractures. Remote anterior eighth, ninth, and tenth rib fractures with callus. Remote posterior tenth rib fracture. No evidence of acute rib fracture. Remote fractures of right anterior/lateral second through ninth ribs. No acute right rib fracture. No sternal fracture. No fracture of the thoracic spine, included clavicles or shoulder girdles. CT ABDOMEN PELVIS FINDINGS Hepatobiliary: Multiple hepatic cysts. No evidence of a Paddock injury or perihepatic hematoma. Layering hyperdensity in the gallbladder is likely sludge. No pericholecystic inflammation. No biliary dilatation. Pancreas: Pancreas is unremarkable.  No peripancreatic inflammation. No evidence of pancreatic injury or pancreatic cyst. Spleen: Splenomegaly with spleen spanning 15.4 cm cranial caudal. No perisplenic hematoma or focal abnormality. Adrenals/Urinary Tract: No adrenal nodule. Polycystic kidneys which are enlarged with innumerable cysts of varying sizes. 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 There is no transplant hydronephrosis. No evidence of renal injury. Urinary bladder is only minimally distended, partially obscured by streak artifact from left hip arthroplasty. No obvious focal bladder abnormality. Stomach/Bowel: Sigmoid colonic diverticulosis without diverticulitis. Mild colonic tortuosity. Small volume of colonic stool. Appendix is normal. Stomach and small bowel are unremarkable. No obstruction, inflammatory change, or evidence of injury. Vascular/Lymphatic: Aorto bi-iliac atherosclerosis. No aortic aneurysm. No retroperitoneal fluid. No adenopathy. Reproductive: Uterus partially obscured by streak artifact from left hip arthroplasty. No suspicious adnexal mass. Other: Small amount of fluid in the left inguinal canal is unchanged from 12/20/2019 CT. No ascites or free fluid in the abdomen. No free air. There is a small fat containing umbilical hernia. Musculoskeletal: Left hip arthroplasty. The bones are under mineralized. Remote left superior and inferior pubic rami fractures. Remote bilateral sacral  insufficiency fractures. No evidence of acute fracture. IMPRESSION: 1. No evidence of acute traumatic injury in the chest, abdomen, or pelvis. 2. Small bilateral pleural effusions with compressive atelectasis. 3. Multi chamber cardiomegaly. Small circumferential pericardial effusion. Dilated main pulmonary artery suggesting pulmonary arterial hypertension. 4. No acute findings in the abdomen/pelvis. 5. Polycystic kidneys and liver. Transplant kidney in the right iliac fossa without transplant hydronephrosis. 6. Splenomegaly. 7. Colonic  diverticulosis without diverticulitis. 8. Remote bilateral rib fractures.  No acute rib fracture. Aortic Atherosclerosis (ICD10-I70.0). Electronically Signed   By: Keith Rake M.D.   On: 04/15/2020 21:39   DG Chest 1 View  Result Date: 04/16/2020 CLINICAL DATA:  Shortness of breath EXAM: CHEST  1 VIEW COMPARISON:  CT chest 04/15/2020 FINDINGS: Bilateral effusions are less well visualized on today's examination. There is interstitial opacity throughout the lungs with a perihilar and basilar predominance with some fissural and septal thickening as well. The heart is enlarged. Pulmonary vascularity is cephalized indistinct with prominence of the central pulmonary arteries is again noted, better seen on comparison. No acute osseous or soft tissue abnormality. Telemetry leads overlie the chest. IMPRESSION: Findings highly suspicious for congestive heart failure with cardiomegaly, vascular congestion and bilateral effusions with interstitial opacities suggesting some developing interstitial edema. Electronically Signed   By: [name] M.D.   On: 04/16/2020 01:43   CT Head Wo Contrast  Result Date: 04/15/2020 CLINICAL DATA:  Status post trauma. EXAM: CT HEAD WITHOUT CONTRAST TECHNIQUE: Contiguous axial images were obtained from the base of the skull through the vertex without intravenous contrast. COMPARISON:  Apr 19, 2019 FINDINGS: Brain: No evidence of acute infarction, hemorrhage, hydrocephalus, extra-axial collection or mass lesion/mass effect. Vascular: No hyperdense vessel or unexpected calcification. Skull: Normal. Negative for fracture or focal lesion. Sinuses/Orbits: No acute finding. Other: None. IMPRESSION: No acute intracranial pathology. Electronically Signed   By: Virgina Norfolk M.D.   On: 04/15/2020 21:44   CT Chest Wo Contrast  Result Date: 04/15/2020 CLINICAL DATA:  Worsening shortness of breath. Increased nausea and vomiting. Fever. Patient reports with recent fall with left-sided  rib pain. EXAM: CT CHEST, ABDOMEN AND PELVIS WITHOUT CONTRAST TECHNIQUE: Multidetector CT imaging of the chest, abdomen and pelvis was performed following the standard protocol without IV contrast. COMPARISON:  Radiograph earlier this day. Most recent chest CT 07/27/2014, most recent abdominal CT 03/31/2019 FINDINGS: CT CHEST FINDINGS Cardiovascular: No periaortic stranding to suggest injury. Dilated main pulmonary artery at 4.1 cm. Small circumferential pericardial effusion. Multi chamber cardiomegaly. There are prominent venous collaterals anterior to the left axilla and in the included left upper extremity. Decreased density of the blood pool suggesting anemia. Mediastinum/Nodes: No mediastinal hematoma. No pneumomediastinum. Few scattered mediastinal lymph nodes that are not enlarged by size criteria. Limited assessment for hilar adenopathy in the absence of IV contrast. No esophageal wall thickening. Subcentimeter nodule in the left lobe of the thyroid gland, does not need further evaluation based on size. Lungs/Pleura: Small bilateral pleural effusions. Compressive atelectasis most prominent in the lower lobes. Additional subsegmental atelectasis in the lingula and left upper lobe. No pneumothorax. No evidence of pulmonary contusion. No pulmonary mass. There is mild central bronchial thickening. Musculoskeletal: Remote left anterior first and second rib fractures. Remote anterior eighth, ninth, and tenth rib fractures with callus. Remote posterior tenth rib fracture. No evidence of acute rib fracture. Remote fractures of right anterior/lateral second through ninth ribs. No acute right rib fracture. No sternal fracture. No fracture of the thoracic spine, included clavicles  or shoulder girdles. CT ABDOMEN PELVIS FINDINGS Hepatobiliary: Multiple hepatic cysts. No evidence of a Paddock injury or perihepatic hematoma. Layering hyperdensity in the gallbladder is likely sludge. No pericholecystic inflammation. No  biliary dilatation. Pancreas: Pancreas is unremarkable. No peripancreatic inflammation. No evidence of pancreatic injury or pancreatic cyst. Spleen: Splenomegaly with spleen spanning 15.4 cm cranial caudal. No perisplenic hematoma or focal abnormality. Adrenals/Urinary Tract: No adrenal nodule. Polycystic kidneys which are enlarged with innumerable cysts of varying sizes. 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 There is no transplant hydronephrosis. No evidence of renal injury. Urinary bladder is only minimally distended, partially obscured by streak artifact from left hip arthroplasty. No obvious focal bladder abnormality. Stomach/Bowel: Sigmoid colonic diverticulosis without diverticulitis. Mild colonic tortuosity. Small volume of colonic stool. Appendix is normal. Stomach and small bowel are unremarkable. No obstruction, inflammatory change, or evidence of injury. Vascular/Lymphatic: Aorto bi-iliac atherosclerosis. No aortic aneurysm. No retroperitoneal fluid. No adenopathy. Reproductive: Uterus partially obscured by streak artifact from left hip arthroplasty. No suspicious adnexal mass. Other: Small amount of fluid in the left inguinal canal is unchanged from 12/20/2019 CT. No ascites or free fluid in the abdomen. No free air. There is a small fat containing umbilical hernia. Musculoskeletal: Left hip arthroplasty. The bones are under mineralized. Remote left superior and inferior pubic rami fractures. Remote bilateral sacral insufficiency fractures. No evidence of acute fracture. IMPRESSION: 1. No evidence of acute traumatic injury in the chest, abdomen, or pelvis. 2. Small bilateral pleural effusions with compressive atelectasis. 3. Multi chamber cardiomegaly. Small circumferential pericardial effusion. Dilated main pulmonary artery suggesting pulmonary arterial hypertension. 4. No acute findings in the abdomen/pelvis. 5. Polycystic kidneys and liver. Transplant kidney in the right iliac fossa without  transplant hydronephrosis. 6. Splenomegaly. 7. Colonic diverticulosis without diverticulitis. 8. Remote bilateral rib fractures.  No acute rib fracture. Aortic Atherosclerosis (ICD10-I70.0). Electronically Signed   By: Keith Rake M.D.   On: 04/15/2020 21:39   CT Cervical Spine Wo Contrast  Result Date: 04/15/2020 CLINICAL DATA:  Status post trauma. EXAM: CT CERVICAL SPINE WITHOUT CONTRAST TECHNIQUE: Multidetector CT imaging of the cervical spine was performed without intravenous contrast. Multiplanar CT image reconstructions were also generated. COMPARISON:  None. FINDINGS: Alignment: Approximately 1 mm to 2 mm anterolisthesis of the C4 vertebral body is seen on C5. Skull base and vertebrae: No acute fracture. No primary bone lesion or focal pathologic process. Soft tissues and spinal canal: No prevertebral fluid or swelling. No visible canal hematoma. Disc levels: Mild endplate sclerosis is seen at the level of C5-C6. Moderate severity intervertebral disc space narrowing is also seen at this level. Upper chest: Negative. Other: None. IMPRESSION: 1. No acute osseous abnormality. 2. Approximately 1 mm to 2 mm anterolisthesis of the C4 vertebral body on C5. 3. Moderate severity degenerative changes at the level of C5-C6. Electronically Signed   By: Virgina Norfolk M.D.   On: 04/15/2020 21:26   CT L-SPINE NO CHARGE  Result Date: 04/15/2020 CLINICAL DATA:  Back pain. Recent fall. EXAM: CT LUMBAR SPINE WITHOUT CONTRAST TECHNIQUE: Multidetector CT imaging of the lumbar spine was performed without intravenous contrast administration. Multiplanar CT image reconstructions were also generated. COMPARISON:  Lumbar spine radiographs 12/19/2019. CT pelvis 12/20/2019. FINDINGS: Segmentation: 5 lumbar type vertebrae. Alignment: Normal. Vertebrae: Chronic bilateral sacral fractures with sclerosis and angular deformity of the sacrum anteriorly at the S2 level. Chronic bilateral L5 transverse process fractures. No  acute lumbar spine fracture. Small Schmorl's nodes at T11-12 and T12-L1. Paraspinal and other  soft tissues: No acute paraspinal soft tissue abnormality identified. Intra-abdominal and pelvic contents reported separately. Disc levels: Intervertebral disc space heights are preserved in the lumbar spine. Disc bulging and severe facet hypertrophy at L4-5 result in mild right greater than left neural foraminal stenosis without evidence of significant spinal stenosis. There is mild disc bulging at L3-4 with minimal to mild right neural foraminal stenosis. IMPRESSION: 1. No acute osseous abnormality identified in the lumbar spine. 2. Chronic bilateral sacral fractures and chronic bilateral L5 transverse process fractures. 3. Severe L4-5 facet hypertrophy with mild right greater than left neural foraminal stenosis. Electronically Signed   By: Logan Bores M.D.   On: 04/15/2020 21:32   DG Chest Port 1 View  Result Date: 04/16/2020 CLINICAL DATA:  Shortness of breath EXAM: PORTABLE CHEST 1 VIEW COMPARISON:  Apr 16, 2020 study obtained earlier in the day FINDINGS: Heart is mildly enlarged with mild pulmonary venous hypertension. There is slight interstitial edema. No appreciable consolidation or pleural effusion. There is mild atelectatic change in the lower lung regions bilaterally. No adenopathy. No bone lesions. IMPRESSION: Mild cardiomegaly with a degree of pulmonary vascular congestion. Slight interstitial edema. There may be a degree of congestive heart failure. Suspect somewhat less edema compared to earlier in the day. No new opacity. No consolidation. Mild atelectatic change in each lower lung region. Electronically Signed   By: Lowella Grip III M.D.   On: 04/16/2020 08:46   DG Chest Portable 1 View  Result Date: 04/15/2020 CLINICAL DATA:  64 year old female with history of trauma from a fall complaining of left-sided rib pain. EXAM: PORTABLE CHEST 1 VIEW COMPARISON:  Chest x-ray 04/07/2020. FINDINGS:  Lung volumes are low. Linear opacity in the left mid lung, new compared to the recent prior study, presumably a focus of subsegmental atelectasis. No acute consolidative airspace disease. No pleural effusions. Cephalization of the pulmonary vasculature, without frank pulmonary edema. Heart size is mildly enlarged. Upper mediastinal contours are within normal limits. An old healed left-sided rib fractures are noted. No definite acute displaced rib fractures are identified. IMPRESSION: 1. Low lung volumes with new area of subsegmental atelectasis in the left mid lung. 2. Cardiomegaly with pulmonary venous congestion, but no frank pulmonary edema. 3. Multiple old healed left-sided rib fractures, similar to prior chest CT 07/27/2014. Electronically Signed   By: Vinnie Langton M.D.   On: 04/15/2020 19:23   DG Hip Unilat W or Wo Pelvis 2-3 Views Left  Result Date: 04/15/2020 CLINICAL DATA:  Status post fall 3 days ago. EXAM: DG HIP (WITH OR WITHOUT PELVIS) 2-3V LEFT COMPARISON:  February 01, 2020 FINDINGS: There is no evidence of an acute hip fracture or dislocation. Chronic fractures of the left superior and left inferior pubic rami are present. Fragmentation of the left greater trochanter is again noted. 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 There is no evidence of surrounding lucency to suggest the presence of hardware loosening or infection. There is no evidence of arthropathy or other focal bone abnormality. Radiopaque surgical clips are seen overlying the right acetabulum and inferior aspect of the sacrum on the right. IMPRESSION: 1. No evidence of an acute osseous abnormality. 2. 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 Electronically Signed   By: Virgina Norfolk M.D.   On: 04/15/2020 21:38        Scheduled Meds: . allopurinol  300 mg Oral Daily  . ascorbic acid  500 mg Oral Daily  . aspirin  81 mg  Oral Daily  . brimonidine  1 drop Both Eyes BID  . budesonide  (PULMICORT) nebulizer solution  0.5 mg Nebulization BID  . cholecalciferol  1,000 Units Oral Daily  . cycloSPORINE  1 drop Both Eyes BID  . docusate sodium  100 mg Oral BID  . dorzolamide  1 drop Both Eyes Daily  . enoxaparin (LOVENOX) injection  40 mg Subcutaneous Q24H  . feeding supplement (ENSURE ENLIVE)  237 mL Oral BID BM  . feeding supplement (PRO-STAT SUGAR FREE 64)  30 mL Oral BID WC  . ferrous sulfate  325 mg Oral Q breakfast  . FLUoxetine  40 mg Oral Daily  . furosemide  40 mg Intravenous BID  . furosemide  40 mg Oral Daily  . gabapentin  200 mg Oral TID  . latanoprost  1 drop Both Eyes QHS  . levalbuterol      . levalbuterol  1.25 mg Nebulization Q8H  . magnesium oxide  400 mg Oral BID  . methylPREDNISolone (SOLU-MEDROL) injection  40 mg Intravenous Q8H  . multivitamin with minerals  1 tablet Oral Daily  . mycophenolate  180 mg Oral BID  . pantoprazole  40 mg Oral BID  . sodium chloride flush  3 mL Intravenous Q12H  . tacrolimus  0.5 mg Oral QHS  . tacrolimus  1 mg Oral Daily  . valACYclovir  500 mg Oral BID  . verapamil  240 mg Oral Daily  . vitamin B-12  1,000 mcg Oral Daily   Continuous Infusions: . sodium chloride    . ceFEPime (MAXIPIME) IV Stopped (04/16/20 0519)     LOS: 0 days   Critical care time spent : 25 minutes examining the patient, discussing with RN, consultants as needed, coordinating care and management.The medical decision making on this patient was of high complexity, the critically ill patient is at high risk for clinical deterioration.       Graceanna Theissen Arsenio Loader, MD [facility]  If 7PM-7AM, please contact night-coverage  04/16/2020, 1:59 PM

## 2020-04-16 NOTE — ED Notes (Signed)
Lunch Tray Ordered @ 1041. 

## 2020-04-16 NOTE — Progress Notes (Signed)
RT attempted to retrieve abg but was unsuccessful. RN aware.

## 2020-04-16 NOTE — ED Notes (Signed)
Dr. Cyd Silence informed pt nausea continues after IV zofran and phenagran. Nausea continues with no vomiting only dry heaves. POC: RN to inform if vomiting returns. Continue PRN zofran. No new orders received. Pt informed

## 2020-04-16 NOTE — ED Notes (Signed)
Tele  Breakfast Ordered 

## 2020-04-16 NOTE — Progress Notes (Signed)
Patient transported to 5W23 from ED on bipap. RN at bedside.

## 2020-04-16 NOTE — Progress Notes (Signed)
Bipap placed on pt per MD order. Breathing treatment given. RT attempted ABG but was unsuccessful. Will make RN aware.

## 2020-04-16 NOTE — Progress Notes (Signed)
April [name] [id number] Admission Data: 04/16/2020 6:57 PM Attending Provider: Damita Lack, MD  [id number], Abigail Butts, MD Consults/ Treatment Team:   April [name] is a 64 y.o. female patient admitted from ED awake, alert  & orientated  X 3,  Full Code, VSS - Blood pressure (!) 178/69, pulse 91, temperature 97.7 F (36.5 C), temperature source Axillary, resp. rate (!) 23, height 5\' 1"  (1.549 m), weight 72.9 kg, SpO2 99 %., O2, on  BIPAP, no c/o shortness of breath, no c/o chest pain, no distress noted. Tele # [id number] placed and pt is currently running:normal sinus rhythm.   IV site WDL:  antecubital right, condition patent and no redness with a transparent dsg that's clean dry and intact.    Pt orientation to unit, room and routine. Information packet given to patient/family and safety video watched.  Admission INP armband ID verified with patient/family, and in place. SR up x 2, fall risk assessment complete with Patient and family verbalizing understanding of risks associated with falls. Pt verbalizes an understanding of how to use the call bell and to call for help before getting out of bed.  Skin, clean-dry, stage two sacrum wound, foam dressing apply to the site.    Will cont to monitor and assist as needed.  Hosie Spangle, [place] 04/16/2020 6:57 PM

## 2020-04-16 NOTE — Progress Notes (Signed)
This RN paged attending to come see patient and ask if she would be a good candidate for Bipap.

## 2020-04-16 NOTE — Progress Notes (Signed)
Pt able to tell me that she wanted her nausea medication before she started to vomit again. Pt wanted RN to take out her bfast tray so she didn't have to smell it. Pt alert and oriented x4. Pt is breathing with her tummy a lot and states she is miserable. This RN and Santiago Glad cleaned pts bed up and resituated her in her bed.

## 2020-04-16 NOTE — H&P (Signed)
History and Physical    VANCE BELCOURT NTI:[id number] DOB: 12-05-1955 DOA: 04/15/2020  PCP: [name], MD  Patient coming from: [facility]   Chief Complaint:  Chief Complaint  Patient presents with  . Shortness of Breath  . Nausea     HPI:    64 year old female with past medical history of polycystic kidney disease status post right kidney transplant, COPD, diastolic congestive heart failure and chronic hypoxic respiratory failure on 2 L of oxygen via nasal cannula, hypertension, anxiety disorder, obesity who presents to [facility] emergency department with complaints of shortness of breath nausea and vomiting.  Of note, patient has been admitted numerous times in the past several months at [facility], most recently 3/18-4/2 for SIRS without a source identified.  Patient was then admitted again on 4/21-4/26 for a COPD exacerbation.  Patient was once again admitted 5/16-5/20 for another COPD exacerbation.  Patient is an extremely poor historian due to lethargy.  Ever since falling on Friday (which she reports is due to slipping) she has felt progressively worse but is unable to provide significant additional details.  Patient explains that earlier in the day on Monday 5/24 she began to experience shortness of breath and nausea with frequent bouts of nonbilious nonbloody vomiting.  Patient denies significant cough, fevers, abdominal pain or diarrhea.  Patient denies dysuria and is unable to comment on her appetite.  Patient was eventually sent to [facility] emergency department from [facility] for evaluation.  Upon evaluation in the emergency department patient was found to have multiple SIRS criteria with tachycardia, tachypnea and fever with a initial temperature of 102.7 F.  Urinalysis was found to be suggestive of a urinary tract infection.  Patient was initially ordered 2 L of lactated Ringer solution and initiated on broad-spectrum intravenous antibiotic  therapy with cefepime, vancomycin and Flagyl.  The hospitalist group was then called to assess patient for admission the hospital.  Review of Systems: Unable to fully obtain due to patient being extremely lethargic.  Past Medical History:  Diagnosis Date  . Arthritis   . Asthma   . CHF (congestive heart failure) ([place])   . Depression   . Essential hypertension 02/08/2020  . GERD (gastroesophageal reflux disease)   . Hyperlipidemia   . Hyperparathyroidism   . Polycystic kidney   . PONV (postoperative nausea and vomiting)     Past Surgical History:  Procedure Laterality Date  . 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 BILATERAL OOPHORECTOMY    . CARPAL TUNNEL RELEASE  2000  . CESAREAN SECTION    . INSERTION OF DIALYSIS CATHETER  03/31/2012   Procedure: INSERTION OF DIALYSIS CATHETER;  Surgeon: Angelia Mould, MD;  Location: [place];  Service: Vascular;  Laterality: N/A;  insertion of dialysis catheter right internal jugular vein  . 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 ORIF TIBIA FRACTURE Left 12/23/2018   Procedure: OPEN REDUCTION INTERNAL FIXATION (ORIF) TIBIA FRACTURE;  Surgeon: Shona Needles, MD;  Location: [place];  Service: Orthopedics;  Laterality: Left;  . ORIF TIBIA PLATEAU Right 12/23/2018   Procedure: OPEN REDUCTION INTERNAL FIXATION (ORIF) TIBIAL PLATEAU;  Surgeon: Shona Needles, MD;  Location: [place];  Service: Orthopedics;  Laterality: Right;  . ORIF WRIST FRACTURE Left 12/08/2019   Procedure: OPEN REDUCTION INTERNAL FIXATION (ORIF) WRIST FRACTURE, REPAIR LACERATION LEFT FOREARM;  Surgeon: Dorna Leitz, MD;  Location: [facility];  Service: Orthopedics;  Laterality: Left;  .  TONSILLECTOMY  1967  . TOTAL HIP ARTHROPLASTY Left 12/08/2019   Procedure: TOTAL HIP ARTHROPLASTY ANTERIOR APPROACH;  Surgeon: Dorna Leitz, MD;  Location: [facility];  Service: Orthopedics;  Laterality: Left;  . TUBAL LIGATION  2010     reports that she quit smoking about 22 years  ago. Her smoking use included cigarettes. She has a 3.75 pack-year smoking history. She has never used smokeless tobacco. She reports that she does not drink alcohol or use drugs.  Allergies  Allergen Reactions  . Infed [Iron Dextran] Other (See Comments)    Chest tightness  . Pentamidine Itching, Shortness Of Breath and Swelling  . Erythromycin [Erythromycin] Other (See Comments)    Mouth Ulcers  . Iohexol Other (See Comments)    Per patient "she has had a kidney transplant and should never have contrast"  . Oxycodone Nausea Only and Nausea And Vomiting  . Erythromycin Rash    Causes breakout in mouth  . Ultram [Tramadol Hcl] Anxiety    Family History  Problem Relation Age of Onset  . Hypertension Mother   . Heart disease Father        CABG history  . Polycystic kidney disease Father   . Polycystic kidney disease Brother      Prior to Admission medications   Medication Sig Start Date End Date Taking? Authorizing Provider  albuterol (VENTOLIN HFA) 108 (90 Base) MCG/ACT inhaler Inhale 2 puffs into the lungs every 4 (four) hours as needed for wheezing or shortness of breath. 01/04/20  Yes Angiulli, Lavon Paganini, PA-C  allopurinol (ZYLOPRIM) 300 MG tablet Take 1 tablet (300 mg total) by mouth daily. 01/04/20  Yes Angiulli, Lavon Paganini, PA-C  Amino Acids-Protein Hydrolys (FEEDING SUPPLEMENT, PRO-STAT SUGAR FREE 64,) LIQD Take 30 mLs by mouth 2 (two) times daily with a meal.   Yes [provider]  ascorbic acid (VITAMIN C) 500 MG tablet Take 500 mg by mouth daily.   Yes [provider]  aspirin 81 MG chewable tablet Chew 81 mg by mouth daily.   Yes [provider]  brimonidine (ALPHAGAN P) 0.1 % SOLN Place 1 drop into both eyes 2 (two) times daily.    Yes [provider]  cholecalciferol (VITAMIN D3) 25 MCG (1000 UNIT) tablet Take 1 tablet (1,000 Units total) by mouth daily. 01/04/20  Yes Angiulli, Lavon Paganini, PA-C  cycloSPORINE (RESTASIS) 0.05 % ophthalmic  emulsion Place 1 drop into both eyes 2 (two) times daily. 01/04/20  Yes Angiulli, Lavon Paganini, PA-C  docusate sodium (COLACE) 100 MG capsule Take 1 capsule (100 mg total) by mouth 2 (two) times daily. 12/13/19  Yes Shelly Coss, MD  dorzolamide (TRUSOPT) 2 % ophthalmic solution Place 1 drop into both eyes daily. 06/25/19  Yes [provider]  feeding supplement, ENSURE ENLIVE, (ENSURE ENLIVE) LIQD Take 237 mLs by mouth 2 (two) times daily between meals. 02/23/20  Yes Alma Friendly, MD  ferrous sulfate 325 (65 FE) MG tablet Take 325 mg by mouth daily with breakfast.   Yes [provider]  FLUoxetine (PROZAC) 40 MG capsule Take 1 capsule (40 mg total) by mouth daily. 01/04/20  Yes Angiulli, Lavon Paganini, PA-C  furosemide (LASIX) 40 MG tablet Take 1 tablet (40 mg total) by mouth daily. 02/07/20  Yes Sheikh, Omair Latif, DO  gabapentin (NEURONTIN) 100 MG capsule Take 200 mg by mouth 3 (three) times daily.   Yes [provider]  guaiFENesin (MUCINEX) 600 MG 12 hr tablet Take 1 tablet (600 mg  total) by mouth 2 (two) times daily. 02/06/20  Yes Sheikh, Omair Latif, DO  hydrOXYzine (ATARAX/VISTARIL) 10 MG tablet Take 10 mg by mouth 2 (two) times daily as needed for anxiety.   Yes [provider]  ipratropium-albuterol (DUONEB) 0.5-2.5 (3) MG/3ML SOLN Take 3 mLs by nebulization 2 (two) times daily. 03/18/20 04/17/20 Yes Arrien, Jimmy Picket, MD  magnesium oxide (MAG-OX) 400 MG tablet Take 1 tablet (400 mg total) by mouth 2 (two) times daily. 01/04/20  Yes Angiulli, Lavon Paganini, PA-C  methocarbamol (ROBAXIN) 500 MG tablet Take 1 tablet (500 mg total) by mouth 4 (four) times daily. 01/04/20  Yes Angiulli, Lavon Paganini, PA-C  Multiple Vitamin (MULTIVITAMIN WITH MINERALS) TABS tablet Take 1 tablet by mouth daily. 02/24/20  Yes Alma Friendly, MD  mycophenolate (MYFORTIC) 180 MG EC tablet Take 180 mg by mouth 2 (two) times daily.   Yes [provider]  pantoprazole (PROTONIX) 40 MG  tablet Take 1 tablet (40 mg total) by mouth 2 (two) times daily. 01/04/20  Yes Angiulli, Lavon Paganini, PA-C  predniSONE (DELTASONE) 5 MG tablet Take 1.5 tablets (7.5 mg total) by mouth daily with breakfast. 03/18/20  Yes Arrien, Jimmy Picket, MD  PROGRAF 1 MG capsule Take 1 mg by mouth daily. 03/22/19  Yes [provider]  protein supplement (PROSOURCE NO CARB) LIQD Take 30 mLs by mouth in the morning and at bedtime.   Yes [provider]  SYMBICORT 160-4.5 MCG/ACT inhaler Inhale 2 puffs into the lungs 2 (two) times daily. 01/04/20  Yes Angiulli, Lavon Paganini, PA-C  tacrolimus (PROGRAF) 0.5 MG capsule Take 0.5 mg by mouth at bedtime. Takes brand name Prograf   Yes [provider]  TRAVATAN Z 0.004 % SOLN ophthalmic solution Place 1 drop into both eyes at bedtime. 07/03/16  Yes [provider]  tuberculin (APLISOL) 5 UNIT/0.1ML injection Inject 5 Units into the skin once.   Yes [provider]  valACYclovir (VALTREX) 500 MG tablet Take 500 mg by mouth 2 (two) times daily.   Yes [provider]  verapamil (CALAN-SR) 240 MG CR tablet Take 1 tablet (240 mg total) by mouth daily. 03/19/20 04/18/20 Yes Arrien, Jimmy Picket, MD  vitamin B-12 (CYANOCOBALAMIN) 100 MCG tablet Take 1,000 mcg by mouth daily.    Yes [provider]    Physical Exam: Vitals:   04/15/20 2315 04/15/20 2345 04/16/20 0000 04/16/20 0100  BP:   (!) 157/68 (!) 167/62  Pulse: (!) 107 (!) 107 (!) 107 (!) 107  Resp: (!) 24 (!) 25 (!) 39 (!) 27  Temp:      TempSrc:      SpO2: 100% 100% 99% 100%  Weight:      Height:        Constitutional: Lethargic but arousable, oriented x3, patient is in respiratory distress.  She is obese. Skin: Significant ecchymoses noted over the bilateral upper extremities.  No rashes noted. good skin turgor noted. Eyes: Pupils are equally reactive to light.  No evidence of scleral icterus or conjunctival pallor.  ENMT: Moist mucous membranes noted.   Posterior pharynx clear of any exudate or lesions.   Neck: normal, supple, no masses, no thyromegaly.  No evidence of jugular venous distension.   Respiratory: Bibasilar rales noted.  Markedly diminished breath sounds in the remainder of the fields with prolonged expiratory phase and intermittent mild expiratory wheezing.  Patient is in respiratory distress with accessory muscle use noted.   Cardiovascular: Tachycardic rate with regular rhythm.  2 out of 6 systolic murmur heard best over the aortic valve.  Trace distal bilateral lower extremity pitting edema.. 2+ pedal pulses. No carotid bruits.  Chest:   Nontender without crepitus or deformity.   Back:   Nontender without crepitus or deformity. Abdomen: Abdomen is protuberant but soft with generalized tenderness.  No evidence of intra-abdominal masses.  Positive bowel sounds noted in all quadrants.   Musculoskeletal: No joint deformity upper and lower extremities. Good ROM, no contractures. Normal muscle tone.  Neurologic: Patient is extremely lethargic but arousable.  Patient is responsive to verbal and painful stimuli.  Sensation is grossly intact.  Patient is intermittently following commands.   Psychiatric: Extremely lethargic but arousable.  Patient does not seem to currently possess insight as to her current situation.   Labs on Admission: I have personally reviewed following labs and imaging studies -   CBC: Recent Labs  Lab 04/15/20 2005  WBC 22.9*  NEUTROABS 20.4*  HGB 8.3*  HCT 28.1*  MCV 100.7*  PLT 825   Basic Metabolic Panel: Recent Labs  Lab 04/15/20 2005  NA 134*  K 4.1  CL 98  CO2 26  GLUCOSE 136*  BUN 21  CREATININE 0.77  CALCIUM 9.5   GFR: Estimated Creatinine Clearance: 67.5 mL/min (by C-G formula based on SCr of 0.77 mg/dL). Liver Function Tests: Recent Labs  Lab 04/15/20 2005  AST 21  ALT 14  ALKPHOS 84  BILITOT 1.2  PROT 7.9  ALBUMIN 2.5*   No results for input(s): LIPASE, AMYLASE in the last  168 hours. No results for input(s): AMMONIA in the last 168 hours. Coagulation Profile: Recent Labs  Lab 04/15/20 2005  INR 1.4*   Cardiac Enzymes: No results for input(s): CKTOTAL, CKMB, CKMBINDEX, TROPONINI in the last 168 hours. BNP (last 3 results) No results for input(s): PROBNP in the last 8760 hours. HbA1C: No results for input(s): HGBA1C in the last 72 hours. CBG: Recent Labs  Lab 04/10/20 1633 04/10/20 2109 04/11/20 0733 04/11/20 1123 04/11/20 2047  GLUCAP 122* 168* 96 139* 115*   Lipid Profile: No results for input(s): CHOL, HDL, LDLCALC, TRIG, CHOLHDL, LDLDIRECT in the last 72 hours. Thyroid Function Tests: No results for input(s): TSH, T4TOTAL, FREET4, T3FREE, THYROIDAB in the last 72 hours. Anemia Panel: No results for input(s): VITAMINB12, FOLATE, FERRITIN, TIBC, IRON, RETICCTPCT in the last 72 hours. Urine analysis:    Component Value Date/Time   COLORURINE AMBER (A) 04/15/2020 2005   APPEARANCEUR CLOUDY (A) 04/15/2020 2005   LABSPEC 1.018 04/15/2020 2005   PHURINE 5.0 04/15/2020 2005   GLUCOSEU NEGATIVE 04/15/2020 2005   HGBUR SMALL (A) 04/15/2020 2005   BILIRUBINUR NEGATIVE 04/15/2020 2005   KETONESUR NEGATIVE 04/15/2020 2005   PROTEINUR >=300 (A) 04/15/2020 2005   UROBILINOGEN 0.2 05/15/2013 0733   NITRITE NEGATIVE 04/15/2020 2005   LEUKOCYTESUR LARGE (A) 04/15/2020 2005    Radiological Exams on Admission - Personally Reviewed: CT ABDOMEN PELVIS WO CONTRAST  Result Date: 04/15/2020 CLINICAL DATA:  Worsening shortness of breath. Increased nausea and vomiting. Fever. Patient reports with recent fall with left-sided rib pain. EXAM: CT CHEST, ABDOMEN AND PELVIS WITHOUT CONTRAST TECHNIQUE: Multidetector CT imaging of the chest, abdomen and pelvis was performed following the standard protocol without IV contrast. COMPARISON:  Radiograph earlier this day. Most recent chest CT 07/27/2014, most recent abdominal CT 03/31/2019 FINDINGS: CT CHEST FINDINGS  Cardiovascular: No periaortic stranding to suggest injury. Dilated main pulmonary artery at 4.1 cm. Small circumferential pericardial effusion. Multi chamber  cardiomegaly. There are prominent venous collaterals anterior to the left axilla and in the included left upper extremity. Decreased density of the blood pool suggesting anemia. Mediastinum/Nodes: No mediastinal hematoma. No pneumomediastinum. Few scattered mediastinal lymph nodes that are not enlarged by size criteria. Limited assessment for hilar adenopathy in the absence of IV contrast. No esophageal wall thickening. Subcentimeter nodule in the left lobe of the thyroid gland, does not need further evaluation based on size. Lungs/Pleura: Small bilateral pleural effusions. Compressive atelectasis most prominent in the lower lobes. Additional subsegmental atelectasis in the lingula and left upper lobe. No pneumothorax. No evidence of pulmonary contusion. No pulmonary mass. There is mild central bronchial thickening. Musculoskeletal: Remote left anterior first and second rib fractures. Remote anterior eighth, ninth, and tenth rib fractures with callus. Remote posterior tenth rib fracture. No evidence of acute rib fracture. Remote fractures of right anterior/lateral second through ninth ribs. No acute right rib fracture. No sternal fracture. No fracture of the thoracic spine, included clavicles or shoulder girdles. CT ABDOMEN PELVIS FINDINGS Hepatobiliary: Multiple hepatic cysts. No evidence of a Paddock injury or perihepatic hematoma. Layering hyperdensity in the gallbladder is likely sludge. No pericholecystic inflammation. No biliary dilatation. Pancreas: Pancreas is unremarkable. No peripancreatic inflammation. No evidence of pancreatic injury or pancreatic cyst. Spleen: Splenomegaly with spleen spanning 15.4 cm cranial caudal. No perisplenic hematoma or focal abnormality. Adrenals/Urinary Tract: No adrenal nodule. Polycystic kidneys which are enlarged with  innumerable cysts of varying sizes. 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 There is no transplant hydronephrosis. No evidence of renal injury. Urinary bladder is only minimally distended, partially obscured by streak artifact from left hip arthroplasty. No obvious focal bladder abnormality. Stomach/Bowel: Sigmoid colonic diverticulosis without diverticulitis. Mild colonic tortuosity. Small volume of colonic stool. Appendix is normal. Stomach and small bowel are unremarkable. No obstruction, inflammatory change, or evidence of injury. Vascular/Lymphatic: Aorto bi-iliac atherosclerosis. No aortic aneurysm. No retroperitoneal fluid. No adenopathy. Reproductive: Uterus partially obscured by streak artifact from left hip arthroplasty. No suspicious adnexal mass. Other: Small amount of fluid in the left inguinal canal is unchanged from 12/20/2019 CT. No ascites or free fluid in the abdomen. No free air. There is a small fat containing umbilical hernia. Musculoskeletal: Left hip arthroplasty. The bones are under mineralized. Remote left superior and inferior pubic rami fractures. Remote bilateral sacral insufficiency fractures. No evidence of acute fracture. IMPRESSION: 1. No evidence of acute traumatic injury in the chest, abdomen, or pelvis. 2. Small bilateral pleural effusions with compressive atelectasis. 3. Multi chamber cardiomegaly. Small circumferential pericardial effusion. Dilated main pulmonary artery suggesting pulmonary arterial hypertension. 4. No acute findings in the abdomen/pelvis. 5. Polycystic kidneys and liver. Transplant kidney in the right iliac fossa without transplant hydronephrosis. 6. Splenomegaly. 7. Colonic diverticulosis without diverticulitis. 8. Remote bilateral rib fractures.  No acute rib fracture. Aortic Atherosclerosis (ICD10-I70.0). Electronically Signed   By: Keith Rake M.D.   On: 04/15/2020 21:39   DG Chest 1 View  Result Date: 04/16/2020 CLINICAL DATA:  Shortness of  breath EXAM: CHEST  1 VIEW COMPARISON:  CT chest 04/15/2020 FINDINGS: Bilateral effusions are less well visualized on today's examination. There is interstitial opacity throughout the lungs with a perihilar and basilar predominance with some fissural and septal thickening as well. The heart is enlarged. Pulmonary vascularity is cephalized indistinct with prominence of the central pulmonary arteries is again noted, better seen on comparison. No acute osseous or soft tissue abnormality. Telemetry leads overlie the chest. IMPRESSION: Findings highly suspicious for  congestive heart failure with cardiomegaly, vascular congestion and bilateral effusions with interstitial opacities suggesting some developing interstitial edema. Electronically Signed   By: [name] M.D.   On: 04/16/2020 01:43   CT Head Wo Contrast  Result Date: 04/15/2020 CLINICAL DATA:  Status post trauma. EXAM: CT HEAD WITHOUT CONTRAST TECHNIQUE: Contiguous axial images were obtained from the base of the skull through the vertex without intravenous contrast. COMPARISON:  Apr 19, 2019 FINDINGS: Brain: No evidence of acute infarction, hemorrhage, hydrocephalus, extra-axial collection or mass lesion/mass effect. Vascular: No hyperdense vessel or unexpected calcification. Skull: Normal. Negative for fracture or focal lesion. Sinuses/Orbits: No acute finding. Other: None. IMPRESSION: No acute intracranial pathology. Electronically Signed   By: Virgina Norfolk M.D.   On: 04/15/2020 21:44   CT Chest Wo Contrast  Result Date: 04/15/2020 CLINICAL DATA:  Worsening shortness of breath. Increased nausea and vomiting. Fever. Patient reports with recent fall with left-sided rib pain. EXAM: CT CHEST, ABDOMEN AND PELVIS WITHOUT CONTRAST TECHNIQUE: Multidetector CT imaging of the chest, abdomen and pelvis was performed following the standard protocol without IV contrast. COMPARISON:  Radiograph earlier this day. Most recent chest CT 07/27/2014, most recent  abdominal CT 03/31/2019 FINDINGS: CT CHEST FINDINGS Cardiovascular: No periaortic stranding to suggest injury. Dilated main pulmonary artery at 4.1 cm. Small circumferential pericardial effusion. Multi chamber cardiomegaly. There are prominent venous collaterals anterior to the left axilla and in the included left upper extremity. Decreased density of the blood pool suggesting anemia. Mediastinum/Nodes: No mediastinal hematoma. No pneumomediastinum. Few scattered mediastinal lymph nodes that are not enlarged by size criteria. Limited assessment for hilar adenopathy in the absence of IV contrast. No esophageal wall thickening. Subcentimeter nodule in the left lobe of the thyroid gland, does not need further evaluation based on size. Lungs/Pleura: Small bilateral pleural effusions. Compressive atelectasis most prominent in the lower lobes. Additional subsegmental atelectasis in the lingula and left upper lobe. No pneumothorax. No evidence of pulmonary contusion. No pulmonary mass. There is mild central bronchial thickening. Musculoskeletal: Remote left anterior first and second rib fractures. Remote anterior eighth, ninth, and tenth rib fractures with callus. Remote posterior tenth rib fracture. No evidence of acute rib fracture. Remote fractures of right anterior/lateral second through ninth ribs. No acute right rib fracture. No sternal fracture. No fracture of the thoracic spine, included clavicles or shoulder girdles. CT ABDOMEN PELVIS FINDINGS Hepatobiliary: Multiple hepatic cysts. No evidence of a Paddock injury or perihepatic hematoma. Layering hyperdensity in the gallbladder is likely sludge. No pericholecystic inflammation. No biliary dilatation. Pancreas: Pancreas is unremarkable. No peripancreatic inflammation. No evidence of pancreatic injury or pancreatic cyst. Spleen: Splenomegaly with spleen spanning 15.4 cm cranial caudal. No perisplenic hematoma or focal abnormality. Adrenals/Urinary Tract: No adrenal  nodule. Polycystic kidneys which are enlarged with innumerable cysts of varying sizes. 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 There is no transplant hydronephrosis. No evidence of renal injury. Urinary bladder is only minimally distended, partially obscured by streak artifact from left hip arthroplasty. No obvious focal bladder abnormality. Stomach/Bowel: Sigmoid colonic diverticulosis without diverticulitis. Mild colonic tortuosity. Small volume of colonic stool. Appendix is normal. Stomach and small bowel are unremarkable. No obstruction, inflammatory change, or evidence of injury. Vascular/Lymphatic: Aorto bi-iliac atherosclerosis. No aortic aneurysm. No retroperitoneal fluid. No adenopathy. Reproductive: Uterus partially obscured by streak artifact from left hip arthroplasty. No suspicious adnexal mass. Other: Small amount of fluid in the left inguinal canal is unchanged from 12/20/2019 CT. No ascites or free fluid in the abdomen.  No free air. There is a small fat containing umbilical hernia. Musculoskeletal: Left hip arthroplasty. The bones are under mineralized. Remote left superior and inferior pubic rami fractures. Remote bilateral sacral insufficiency fractures. No evidence of acute fracture. IMPRESSION: 1. No evidence of acute traumatic injury in the chest, abdomen, or pelvis. 2. Small bilateral pleural effusions with compressive atelectasis. 3. Multi chamber cardiomegaly. Small circumferential pericardial effusion. Dilated main pulmonary artery suggesting pulmonary arterial hypertension. 4. No acute findings in the abdomen/pelvis. 5. Polycystic kidneys and liver. Transplant kidney in the right iliac fossa without transplant hydronephrosis. 6. Splenomegaly. 7. Colonic diverticulosis without diverticulitis. 8. Remote bilateral rib fractures.  No acute rib fracture. Aortic Atherosclerosis (ICD10-I70.0). Electronically Signed   By: Keith Rake M.D.   On: 04/15/2020 21:39   CT Cervical Spine Wo  Contrast  Result Date: 04/15/2020 CLINICAL DATA:  Status post trauma. EXAM: CT CERVICAL SPINE WITHOUT CONTRAST TECHNIQUE: Multidetector CT imaging of the cervical spine was performed without intravenous contrast. Multiplanar CT image reconstructions were also generated. COMPARISON:  None. FINDINGS: Alignment: Approximately 1 mm to 2 mm anterolisthesis of the C4 vertebral body is seen on C5. Skull base and vertebrae: No acute fracture. No primary bone lesion or focal pathologic process. Soft tissues and spinal canal: No prevertebral fluid or swelling. No visible canal hematoma. Disc levels: Mild endplate sclerosis is seen at the level of C5-C6. Moderate severity intervertebral disc space narrowing is also seen at this level. Upper chest: Negative. Other: None. IMPRESSION: 1. No acute osseous abnormality. 2. Approximately 1 mm to 2 mm anterolisthesis of the C4 vertebral body on C5. 3. Moderate severity degenerative changes at the level of C5-C6. Electronically Signed   By: Virgina Norfolk M.D.   On: 04/15/2020 21:26   CT L-SPINE NO CHARGE  Result Date: 04/15/2020 CLINICAL DATA:  Back pain. Recent fall. EXAM: CT LUMBAR SPINE WITHOUT CONTRAST TECHNIQUE: Multidetector CT imaging of the lumbar spine was performed without intravenous contrast administration. Multiplanar CT image reconstructions were also generated. COMPARISON:  Lumbar spine radiographs 12/19/2019. CT pelvis 12/20/2019. FINDINGS: Segmentation: 5 lumbar type vertebrae. Alignment: Normal. Vertebrae: Chronic bilateral sacral fractures with sclerosis and angular deformity of the sacrum anteriorly at the S2 level. Chronic bilateral L5 transverse process fractures. No acute lumbar spine fracture. Small Schmorl's nodes at T11-12 and T12-L1. Paraspinal and other soft tissues: No acute paraspinal soft tissue abnormality identified. Intra-abdominal and pelvic contents reported separately. Disc levels: Intervertebral disc space heights are preserved in the  lumbar spine. Disc bulging and severe facet hypertrophy at L4-5 result in mild right greater than left neural foraminal stenosis without evidence of significant spinal stenosis. There is mild disc bulging at L3-4 with minimal to mild right neural foraminal stenosis. IMPRESSION: 1. No acute osseous abnormality identified in the lumbar spine. 2. Chronic bilateral sacral fractures and chronic bilateral L5 transverse process fractures. 3. Severe L4-5 facet hypertrophy with mild right greater than left neural foraminal stenosis. Electronically Signed   By: Logan Bores M.D.   On: 04/15/2020 21:32   DG Chest Portable 1 View  Result Date: 04/15/2020 CLINICAL DATA:  64 year old female with history of trauma from a fall complaining of left-sided rib pain. EXAM: PORTABLE CHEST 1 VIEW COMPARISON:  Chest x-ray 04/07/2020. FINDINGS: Lung volumes are low. Linear opacity in the left mid lung, new compared to the recent prior study, presumably a focus of subsegmental atelectasis. No acute consolidative airspace disease. No pleural effusions. Cephalization of the pulmonary vasculature, without frank pulmonary edema. Heart size  is mildly enlarged. Upper mediastinal contours are within normal limits. An old healed left-sided rib fractures are noted. No definite acute displaced rib fractures are identified. IMPRESSION: 1. Low lung volumes with new area of subsegmental atelectasis in the left mid lung. 2. Cardiomegaly with pulmonary venous congestion, but no frank pulmonary edema. 3. Multiple old healed left-sided rib fractures, similar to prior chest CT 07/27/2014. Electronically Signed   By: Vinnie Langton M.D.   On: 04/15/2020 19:23   DG Hip Unilat W or Wo Pelvis 2-3 Views Left  Result Date: 04/15/2020 CLINICAL DATA:  Status post fall 3 days ago. EXAM: DG HIP (WITH OR WITHOUT PELVIS) 2-3V LEFT COMPARISON:  February 01, 2020 FINDINGS: There is no evidence of an acute hip fracture or dislocation. Chronic fractures of the left  superior and left inferior pubic rami are present. Fragmentation of the left greater trochanter is again noted. 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 There is no evidence of surrounding lucency to suggest the presence of hardware loosening or infection. There is no evidence of arthropathy or other focal bone abnormality. Radiopaque surgical clips are seen overlying the right acetabulum and inferior aspect of the sacrum on the right. IMPRESSION: 1. No evidence of an acute osseous abnormality. 2. 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 Electronically Signed   By: Virgina Norfolk M.D.   On: 04/15/2020 21:38    EKG: Personally reviewed.  Rhythm is tachycardia with heart rate of 114 bpm.  No dynamic ST segment changes appreciated.  Assessment/Plan Principal Problem:   Sepsis due to gram-negative UTI [facility])   Patient presenting with multiple sirs criteria in the setting of suggestive of urinary tract infection.  Patient is extremely immunocompromised due to ongoing immunomodulating therapy status post renal transplant.  Consider infectious disease consultation in the morning.  Considering suspected urinary source, will continue intravenous cefepime.  Blood and urine cultures pending.  Of note, patient was hospitalized numerous times in the past several months in particular, hospitalized 3/18-4/2 for SIRS where a source was never identified.  Attempts were made to provide patient with 30 cc/kg of isotonic fluid infusion however patient is rapidly becoming more short of breath prompting [place] to halt the remainder of the boluses and instead initiate Lasix for evidence of pulmonary edema on repeat chest x-ray.  Active Problems:   Acute on chronic diastolic CHF (congestive heart failure) ([place])   As mentioned above, patient is exhibiting signs of developing pulmonary edema in addition to bilateral pleural effusions on chest imaging  This is likely multifactorial  secondary to acute on chronic diastolic congestive heart failure in the setting of substantial proteinuria, possibly nephrotic range.  Initiating intravenous Lasix every 12  Strict input and output monitoring  Monitoring daily weights  Monitoring renal function electrolytes with serial chemistries.    COPD exacerbation ([place])   Additionally, patient is likely suffering from COPD exacerbation considering prolonged expiratory phase and expiratory wheezing on examination  COPD exacerbation likely brought about by pulmonary edema and pleural effusions  Aggressive multilateral therapy  Initiating Solu-Medrol 40 mg IV every 12 for now    GERD without esophagitis   Continue home regimen of daily PPI    Renal transplant recipient   Continue tacrolimus and mycophenolate  Patient is notably immunocompromised secondary to this regimen    Anemia of chronic disease   No clinical evidence of bleeding  Monitoring hemoglobin and hematocrit with serial CBCs    Pericardial effusion  Notable pericardial effusion  on CT imaging of chest, incidental finding  No clinical evidence of tamponade  Echocardiogram ordered for the morning  Patient is chest pain-free with EKG not suggestive of pericarditis.  Pericardial effusion may be secondary to congestive heart failure or significant proteinuria.    Chronic respiratory failure with hypoxia ([place])   Despite patient presenting in multifactorial respiratory distress, patient does not exhibit an increased oxygen requirement  Patient is on nasal cannula at 2 L at baseline.  Will provide additional supplemental oxygen as needed.    Proteinuria  Significant proteinuria noted on urinalysis  Obtaining spot protein creatinine ratio  Consider nephrology consultation if nephrotic range   Code Status:  Full code Family Communication: Daughter has been updated on plan of care by emergency department provider at 11:30 PM.  Status is:  Inpatient  Remains inpatient appropriate because:Altered mental status, Ongoing diagnostic testing needed not appropriate for outpatient work up, Unsafe d/c plan, IV treatments appropriate due to intensity of illness or inability to take PO and Inpatient level of care appropriate due to severity of illness   Dispo: The patient is from: SNF              Anticipated d/c is to: SNF              Anticipated d/c date is: > 3 days              Patient currently is not medically stable to d/c.         Vernelle Emerald MD [facility] Pager [phone]  If 7PM-7AM, please contact night-coverage [email or web address] Use universal [place] password for that web site. If you do not have the password, please call the hospital operator.  04/16/2020, 2:02 AM

## 2020-04-17 ENCOUNTER — Inpatient Hospital Stay (HOSPITAL_COMMUNITY): Payer: Medicare Other

## 2020-04-17 DIAGNOSIS — I5031 Acute diastolic (congestive) heart failure: Secondary | ICD-10-CM

## 2020-04-17 LAB — BASIC METABOLIC PANEL
Anion gap: 15 (ref 5–15)
BUN: 41 mg/dL — ABNORMAL HIGH (ref 8–23)
CO2: 26 mmol/L (ref 22–32)
Calcium: 9.7 mg/dL (ref 8.9–10.3)
Chloride: 101 mmol/L (ref 98–111)
Creatinine, Ser: 1.02 mg/dL — ABNORMAL HIGH (ref 0.44–1.00)
GFR calc Af Amer: >60 mL/min (ref >60)
GFR calc non Af Amer: 59 mL/min — ABNORMAL LOW (ref >60)
Glucose, Bld: 159 mg/dL — ABNORMAL HIGH (ref 70–99)
Potassium: 4.3 mmol/L (ref 3.5–5.1)
Sodium: 142 mmol/L (ref 135–145)

## 2020-04-17 LAB — CBC
HCT: 24.6 % — ABNORMAL LOW (ref 36.0–46.0)
Hemoglobin: 7.3 g/dL — ABNORMAL LOW (ref 12.0–15.0)
MCH: 29.8 pg (ref 26.0–34.0)
MCHC: 29.7 g/dL — ABNORMAL LOW (ref 30.0–36.0)
MCV: 100.4 fL — ABNORMAL HIGH (ref 80.0–100.0)
Platelets: 220 10*3/uL (ref 150–400)
RBC: 2.45 MIL/uL — ABNORMAL LOW (ref 3.87–5.11)
RDW: 23.9 % — ABNORMAL HIGH (ref 11.5–15.5)
WBC: 12.5 10*3/uL — ABNORMAL HIGH (ref 4.0–10.5)
nRBC: 0 % (ref 0.0–0.2)

## 2020-04-17 LAB — MAGNESIUM: Magnesium: 1.8 mg/dL (ref 1.7–2.4)

## 2020-04-17 LAB — ECHOCARDIOGRAM COMPLETE
Height: 61 in
Weight: 2543.23 oz

## 2020-04-17 MED ORDER — METHYLPREDNISOLONE SODIUM SUCC 40 MG IJ SOLR
40.0000 mg | Freq: Two times a day (BID) | INTRAMUSCULAR | Status: DC
  Administered 2020-04-17 – 2020-04-19 (×4): 40 mg via INTRAVENOUS
  Filled 2020-04-17 (×4): qty 1

## 2020-04-17 MED ORDER — LEVALBUTEROL HCL 1.25 MG/0.5ML IN NEBU
1.2500 mg | INHALATION_SOLUTION | Freq: Two times a day (BID) | RESPIRATORY_TRACT | Status: DC
  Administered 2020-04-18 – 2020-04-19 (×2): 1.25 mg via RESPIRATORY_TRACT
  Filled 2020-04-17 (×2): qty 0.5

## 2020-04-17 MED ORDER — PROMETHAZINE HCL 25 MG/ML IJ SOLN
6.2500 mg | Freq: Four times a day (QID) | INTRAMUSCULAR | Status: DC | PRN
  Administered 2020-04-18: 6.25 mg via INTRAVENOUS
  Filled 2020-04-17: qty 1

## 2020-04-17 MED ORDER — LEVALBUTEROL HCL 1.25 MG/0.5ML IN NEBU: 1.2500 mg | INHALATION_SOLUTION | Freq: Four times a day (QID) | RESPIRATORY_TRACT | Status: DC | PRN

## 2020-04-17 MED ORDER — LEVALBUTEROL HCL 1.25 MG/0.5ML IN NEBU
1.2500 mg | INHALATION_SOLUTION | Freq: Four times a day (QID) | RESPIRATORY_TRACT | Status: DC
  Administered 2020-04-17 (×3): 1.25 mg via RESPIRATORY_TRACT
  Filled 2020-04-17 (×3): qty 0.5

## 2020-04-17 MED ORDER — FUROSEMIDE 10 MG/ML IJ SOLN
40.0000 mg | Freq: Three times a day (TID) | INTRAMUSCULAR | Status: DC
  Administered 2020-04-17 – 2020-04-18 (×3): 40 mg via INTRAVENOUS
  Filled 2020-04-17 (×3): qty 4

## 2020-04-17 MED ORDER — LEVALBUTEROL HCL 1.25 MG/0.5ML IN NEBU: 1.2500 mg | INHALATION_SOLUTION | Freq: Four times a day (QID) | RESPIRATORY_TRACT | Status: DC

## 2020-04-17 NOTE — Evaluation (Signed)
Physical Therapy Evaluation Patient Details Name: April [name] MRN: [id number] DOB: Dec 03, 1955 Today's Date: 04/17/2020   History of Present Illness  64 year old female with past medical history of polycystic kidney disease status post right kidney transplant, COPD, diastolic congestive heart failure and chronic hypoxic respiratory failure on 2 L of oxygen via nasal cannula, hypertension, anxiety disorder, obesity who presents to [facility] emergency department with complaints of shortness of breath nausea and vomiting. Pt found to have UTI and started on broad-spectrum antibiotics.  Clinical Impression  Pt presents to PT with deficits in functional mobility, gait, balance, endurance, strength, power, and cognition. Pt is very anxious with significant fear of falling during session and is also limited by nausea and dizziness. Pt is generally weak and requires significant physical assistance to mobilize to edge of bed and stand. Pt remains at a high falls risk due to weakness and would benefit from use of STEDY or mechanical lift for all transfers at this time. PT recommends return to SNF at time of discharge to aide in improving strength and reducing falls risk.    Follow Up Recommendations SNF    Equipment Recommendations  None recommended by PT(defer to post-acute setting)    Recommendations for Other Services       Precautions / Restrictions Precautions Precautions: Fall Restrictions Weight Bearing Restrictions: No      Mobility  Bed Mobility Overal bed mobility: Needs Assistance Bed Mobility: Rolling;Supine to Sit;Sit to Supine Rolling: Min assist   Supine to sit: Mod assist Sit to supine: Mod assist   General bed mobility comments: modA to scoot to edge of bed, assistance for LE management  Transfers Overall transfer level: Needs assistance Equipment used: 1 person hand held assist Transfers: Sit to/from Stand Sit to Stand: Mod assist         General  transfer comment: pt very anxious about OOB mobility  Ambulation/Gait                Stairs            Wheelchair Mobility    Modified Rankin (Stroke Patients Only)       Balance Overall balance assessment: Needs assistance Sitting-balance support: Single extremity supported;Feet unsupported Sitting balance-Leahy Scale: Fair Sitting balance - Comments: minG at edge of bed   Standing balance support: Bilateral upper extremity supported Standing balance-Leahy Scale: Poor Standing balance comment: modA to maintain static standing balance with BUE support of PT                             Pertinent Vitals/Pain Pain Assessment: Faces Faces Pain Scale: Hurts even more Pain Location: hip and buttocks Pain Descriptors / Indicators: Grimacing Pain Intervention(s): Monitored during session    Home Living Family/patient expects to be discharged to:: Skilled nursing facility                 Additional Comments: pt coming from rehab at [facility]    Prior Function Level of Independence: Needs assistance   Gait / Transfers Assistance Needed: pt working with PT at SNF, was able to ambulate 10' with RW and assistance a few days prior to admission, did have a fall at SNF and is anxious bout falling again. Was independent with RW prior to multiple hospital admissions  ADL's / Homemaking Assistance Needed: pt requires assistance for lower body ADLs        Hand Dominance   Dominant Hand:  Right    Extremity/Trunk Assessment   Upper Extremity Assessment Upper Extremity Assessment: Generalized weakness    Lower Extremity Assessment Lower Extremity Assessment: Generalized weakness;RLE deficits/detail;LLE deficits/detail RLE Sensation: decreased light touch LLE Sensation: decreased light touch    Cervical / Trunk Assessment Cervical / Trunk Assessment: Normal  Communication   Communication: No difficulties  Cognition Arousal/Alertness:  Awake/alert Behavior During Therapy: WFL for tasks assessed/performed Overall Cognitive Status: Impaired/Different from baseline Area of Impairment: Orientation                 Orientation Level: Disoriented to;Time                    General Comments General comments (skin integrity, edema, etc.): on 2LNC, VSS, pt reports dizziness and nausea intermittently during session    Exercises General Exercises - Lower Extremity Ankle Circles/Pumps: AROM;Both;20 reps   Assessment/Plan    PT Assessment Patient needs continued PT services  PT Problem List Decreased strength;Decreased activity tolerance;Decreased balance;Decreased mobility;Decreased cognition;Decreased knowledge of use of DME;Decreased safety awareness;Decreased knowledge of precautions;Cardiopulmonary status limiting activity;Pain;Impaired sensation       PT Treatment Interventions DME instruction;Gait training;Functional mobility training;Therapeutic activities;Therapeutic exercise;Balance training;Neuromuscular re-education;Cognitive remediation;Patient/family education    PT Goals (Current goals can be found in the Care Plan section)  Acute Rehab PT Goals Patient Stated Goal: to improve strength and eventually go back home PT Goal Formulation: With patient Time For Goal Achievement: 05/01/20 Potential to Achieve Goals: Fair    Frequency Min 2X/week   Barriers to discharge        Co-evaluation               AM-PAC PT "6 Clicks" Mobility  Outcome Measure Help needed turning from your back to your side while in a flat bed without using bedrails?: A Little Help needed moving from lying on your back to sitting on the side of a flat bed without using bedrails?: A Lot Help needed moving to and from a bed to a chair (including a wheelchair)?: Total Help needed standing up from a chair using your arms (e.g., wheelchair or bedside chair)?: A Lot Help needed to walk in hospital room?: Total Help needed  climbing 3-5 steps with a railing? : Total 6 Click Score: 10    End of Session   Activity Tolerance: Treatment limited secondary to medical complications (Comment)(nausea) Patient left: in bed;with call bell/phone within reach;with bed alarm set Nurse Communication: Mobility status PT Visit Diagnosis: Other abnormalities of gait and mobility (R26.89);Repeated falls (R29.6);Muscle weakness (generalized) (M62.81)    Time: 4034-7425 PT Time Calculation (min) (ACUTE ONLY): 47 min   Charges:   PT Evaluation $PT Eval Moderate Complexity: 1 Mod PT Treatments $Therapeutic Activity: 8-22 mins        Zenaida Niece, PT, DPT Acute Rehabilitation Pager: [phone]   Zenaida Niece 04/17/2020, 2:55 PM

## 2020-04-17 NOTE — Progress Notes (Signed)
PROGRESS NOTE    April [name]  ZOX:[id number] DOB: [birth year] DOA: 04/15/2020 PCP: [name], MD   Brief Narrative:  64 year old female with past medical history of polycystic kidney disease status post right kidney transplant, COPD, diastolic congestive heart failure and chronic hypoxic respiratory failure on 2 L of oxygen via nasal cannula, hypertension, anxiety disorder, obesity who presents to [facility] emergency department with complaints of shortness of breath nausea and vomiting.  Recurrent admissions for COPD exacerbation.  She was found to be febrile with suggestion of urinary tract infection.  Started on broad-spectrum antibiotics.  Urine cultures grew Klebsiella.    Assessment & Plan:   Principal Problem:   Sepsis due to gram-negative UTI ([place]) Active Problems:   GERD without esophagitis   Renal transplant recipient   Acute on chronic diastolic CHF (congestive heart failure) (HCC)   Anemia of chronic disease   COPD exacerbation (HCC)   Pericardial effusion   Chronic respiratory failure with hypoxia (HCC)   Proteinuria   Acute respiratory distress   Sepsis secondary to urinary tract infection -Urine cultures growing Klebsiella, sensitivities is pending. -Continue empiric IV cefepime. -Supportive care.  Acute on chronic hypoxic respiratory failure, 2 L nasal cannula at baseline Acute COPD exacerbation -Breathing is greatly improved.  Currently on 2-3 L of nasal cannula.  BiPAP as needed. -Solu-Medrol IV, scheduled and as needed bronchodilators -ABG.  Acute diastolic congestive heart failure, echo showed an EF of 55 to 60%, class III -Increase Lasix frequency to every 8 hours -Strict input and output.  Monitor electrolytes. -No pericardial effusion seen on the echocardiogram  GERD without esophagitis-continue PPI  Anemia of chronic disease, macrocytic Monitor Hb for now  History of renal transplant -On tacrolimus, mycophenolate.  History  of SVT -Verapamil   DVT prophylaxis: Lovenox Code Status: Full code Family Communication: None  Status is: Inpatient  Remains inpatient appropriate because:Hemodynamically unstable   Dispo: The patient is from: Home              Anticipated d/c is to: Home              Anticipated d/c date is: 2 days              Patient currently is not medically stable to d/c.  Maintain hospital stay for aggressive diuresis.  Hopefully discharge next 24-48 hours.   Subjective: Feels much better today compared to yesterday after being diuresed.  Still having some exertional dyspnea but improved.  Review of Systems Otherwise negative except as per HPI, including: General: Denies fever, chills, night sweats or unintended weight loss. Resp: Denies cough, wheezing, shortness of breath. Cardiac: Denies chest pain, palpitations, orthopnea, paroxysmal nocturnal dyspnea. GI: Denies abdominal pain, nausea, vomiting, diarrhea or constipation GU: Denies dysuria, frequency, hesitancy or incontinence MS: Denies muscle aches, joint pain or swelling Neuro: Denies headache, neurologic deficits (focal weakness, numbness, tingling), abnormal gait Psych: Denies anxiety, depression, SI/HI/AVH Skin: Denies new rashes or lesions ID: Denies sick contacts, exotic exposures, travel  Examination:  Constitutional: Not in acute distress, 2 L nasal cannula Respiratory: Bibasilar rhonchi Cardiovascular: Normal sinus rhythm, no rubs Abdomen: Nontender nondistended good bowel sounds Musculoskeletal: No edema noted Skin: No rashes seen Neurologic: CN 2-12 grossly intact.  And nonfocal Psychiatric: Normal judgment and insight. Alert and oriented x 3. Normal mood.   Objective: Vitals:   04/17/20 0400 04/17/20 0441 04/17/20 0745 04/17/20 0905  BP: 138/74     Pulse: 82 81  80  Resp:  14 18  18   Temp: 97.9 F (36.6 C)  97.9 F (36.6 C)   TempSrc: Axillary  Oral   SpO2: 100% 100%  100%  Weight: 72.1 kg      Height:        Intake/Output Summary (Last 24 hours) at 04/17/2020 1208 Last data filed at 04/17/2020 4496 Gross per 24 hour  Intake 450 ml  Output 500 ml  Net -50 ml   Filed Weights   04/15/20 1921 04/16/20 1801 04/17/20 0400  Weight: 74.8 kg 72.9 kg 72.1 kg     Data Reviewed:   CBC: Recent Labs  Lab 04/15/20 2005 04/16/20 0608 04/16/20 0943 04/17/20 0355  WBC 22.9* 24.9*  --  12.5*  NEUTROABS 20.4* 23.8*  --   --   HGB 8.3* 8.2* 8.5* 7.3*  HCT 28.1* 27.9* 25.0* 24.6*  MCV 100.7* 100.7*  --  100.4*  PLT 234 219  --  759   Basic Metabolic Panel: Recent Labs  Lab 04/15/20 2005 04/16/20 0608 04/16/20 0943 04/17/20 0355  NA 134* 138 136 142  K 4.1 4.6 4.3 4.3  CL 98 99  --  101  CO2 26 25  --  26  GLUCOSE 136* 149*  --  159*  BUN 21 24*  --  41*  CREATININE 0.77 0.86  --  1.02*  CALCIUM 9.5 9.3  --  9.7  MG  --  1.5*  --  1.8   GFR: Estimated Creatinine Clearance: 51.9 mL/min (A) (by C-G formula based on SCr of 1.02 mg/dL (H)). Liver Function Tests: Recent Labs  Lab 04/15/20 2005 04/16/20 0608  AST 21 26  ALT 14 15  ALKPHOS 84 79  BILITOT 1.2 2.1*  PROT 7.9 7.2  ALBUMIN 2.5* 2.3*   No results for input(s): LIPASE, AMYLASE in the last 168 hours. No results for input(s): AMMONIA in the last 168 hours. Coagulation Profile: Recent Labs  Lab 04/15/20 2005 04/16/20 1638  INR 1.4* 1.4*   Cardiac Enzymes: No results for input(s): CKTOTAL, CKMB, CKMBINDEX, TROPONINI in the last 168 hours. BNP (last 3 results) No results for input(s): PROBNP in the last 8760 hours. HbA1C: No results for input(s): HGBA1C in the last 72 hours. CBG: Recent Labs  Lab 04/10/20 1633 04/10/20 2109 04/11/20 0733 04/11/20 1123 04/11/20 2047  GLUCAP 122* 168* 96 139* 115*   Lipid Profile: No results for input(s): CHOL, HDL, LDLCALC, TRIG, CHOLHDL, LDLDIRECT in the last 72 hours. Thyroid Function Tests: Recent Labs    04/16/20 0608  TSH 2.809   Anemia Panel: No  results for input(s): VITAMINB12, FOLATE, FERRITIN, TIBC, IRON, RETICCTPCT in the last 72 hours. Sepsis Labs: Recent Labs  Lab 04/15/20 2005 04/15/20 2254  LATICACIDVEN 1.1 1.0    Recent Results (from the past 240 hour(s))  SARS Coronavirus 2 by RT PCR (hospital order, performed in [facility] hospital lab) Nasopharyngeal Nasopharyngeal Swab     Status: None   Collection Time: 04/11/20 11:23 AM   Specimen: Nasopharyngeal Swab  Result Value Ref Range Status   SARS Coronavirus 2 NEGATIVE NEGATIVE Final    Comment: (NOTE) SARS-CoV-2 target nucleic acids are NOT DETECTED. The SARS-CoV-2 RNA is generally detectable in upper and lower respiratory specimens during the acute phase of infection. The lowest concentration of SARS-CoV-2 viral copies this assay can detect is 250 copies / mL. A negative result does not preclude SARS-CoV-2 infection and should not be used as the sole basis for treatment or other patient management decisions.  A negative result may occur with improper specimen collection / handling, submission of specimen other than nasopharyngeal swab, presence of viral mutation(s) within the areas targeted by this assay, and inadequate number of viral copies (<250 copies / mL). A negative result must be combined with clinical observations, patient history, and epidemiological information. Fact Sheet for Patients:   StrictlyIdeas.no Fact Sheet for Healthcare Providers: BankingDealers.co.za This test is not yet approved or cleared  by the [place] FDA and has been authorized for detection and/or diagnosis of SARS-CoV-2 by FDA under an Emergency Use Authorization (EUA).  This EUA will remain in effect (meaning this test can be used) for the duration of the COVID-19 declaration under Section 564(b)(1) of the Act, 21 U.S.C. section 360bbb-3(b)(1), unless the authorization is terminated or revoked sooner. Performed at [facility], [place] [address], [place], [place] [address]   Blood Culture (routine x 2)     Status: None (Preliminary result)   Collection Time: 04/15/20  8:05 PM   Specimen: BLOOD  Result Value Ref Range Status   Specimen Description BLOOD RIGHT ANTECUBITAL  Final   Special Requests   Final    BOTTLES DRAWN AEROBIC AND ANAEROBIC Blood Culture adequate volume   Culture   Final    NO GROWTH < 24 HOURS Performed at [facility], [place] [address], [place], [place] [address]    Report Status PENDING  Incomplete  Blood Culture (routine x 2)     Status: None (Preliminary result)   Collection Time: 04/15/20  8:25 PM   Specimen: BLOOD RIGHT WRIST  Result Value Ref Range Status   Specimen Description BLOOD RIGHT WRIST  Final   Special Requests   Final    BOTTLES DRAWN AEROBIC AND ANAEROBIC Blood Culture adequate volume   Culture   Final    NO GROWTH < 24 HOURS Performed at [facility], [place] [address] [place], [place] [address]    Report Status PENDING  Incomplete  SARS Coronavirus 2 by RT PCR (hospital order, performed in [facility] hospital lab) Nasopharyngeal Nasopharyngeal Swab     Status: None   Collection Time: 04/15/20  8:25 PM   Specimen: Nasopharyngeal Swab  Result Value Ref Range Status   SARS Coronavirus 2 NEGATIVE NEGATIVE Final    Comment: (NOTE) SARS-CoV-2 target nucleic acids are NOT DETECTED. The SARS-CoV-2 RNA is generally detectable in upper and lower respiratory specimens during the acute phase of infection. The lowest concentration of SARS-CoV-2 viral copies this assay can detect is 250 copies / mL. A negative result does not preclude SARS-CoV-2 infection and should not be used as the sole basis for treatment or other patient management decisions.  A negative result may occur with improper specimen collection / handling, submission of specimen other than nasopharyngeal swab, presence of viral mutation(s) within the areas targeted by this assay, and  inadequate number of viral copies (<250 copies / mL). A negative result must be combined with clinical observations, patient history, and epidemiological information. Fact Sheet for Patients:   StrictlyIdeas.no Fact Sheet for Healthcare Providers: BankingDealers.co.za This test is not yet approved or cleared  by the [place] FDA and has been authorized for detection and/or diagnosis of SARS-CoV-2 by FDA under an Emergency Use Authorization (EUA).  This EUA will remain in effect (meaning this test can be used) for the duration of the COVID-19 declaration under Section 564(b)(1) of the Act, 21 U.S.C. section 360bbb-3(b)(1), unless the authorization is terminated or  revoked sooner. Performed at [facility], [place] [address] [place], [place] [address]   Urine culture     Status: Abnormal (Preliminary result)   Collection Time: 04/15/20 10:17 PM   Specimen: In/Out Cath Urine  Result Value Ref Range Status   Specimen Description IN/OUT CATH URINE  Final   Special Requests NONE  Final   Culture (A)  Final    KLEBSIELLA ORNITHINOLYTICA SUSCEPTIBILITIES TO FOLLOW Performed at [facility], [place] [address] [place], [place] [address]    Report Status PENDING  Incomplete         Radiology Studies: CT ABDOMEN PELVIS WO CONTRAST  Result Date: 04/15/2020 CLINICAL DATA:  Worsening shortness of breath. Increased nausea and vomiting. Fever. Patient reports with recent fall with left-sided rib pain. EXAM: CT CHEST, ABDOMEN AND PELVIS WITHOUT CONTRAST TECHNIQUE: Multidetector CT imaging of the chest, abdomen and pelvis was performed following the standard protocol without IV contrast. COMPARISON:  Radiograph earlier this day. Most recent chest CT 07/27/2014, most recent abdominal CT 03/31/2019 FINDINGS: CT CHEST FINDINGS Cardiovascular: No periaortic stranding to suggest injury. Dilated main pulmonary artery at 4.1 cm. Small  circumferential pericardial effusion. Multi chamber cardiomegaly. There are prominent venous collaterals anterior to the left axilla and in the included left upper extremity. Decreased density of the blood pool suggesting anemia. Mediastinum/Nodes: No mediastinal hematoma. No pneumomediastinum. Few scattered mediastinal lymph nodes that are not enlarged by size criteria. Limited assessment for hilar adenopathy in the absence of IV contrast. No esophageal wall thickening. Subcentimeter nodule in the left lobe of the thyroid gland, does not need further evaluation based on size. Lungs/Pleura: Small bilateral pleural effusions. Compressive atelectasis most prominent in the lower lobes. Additional subsegmental atelectasis in the lingula and left upper lobe. No pneumothorax. No evidence of pulmonary contusion. No pulmonary mass. There is mild central bronchial thickening. Musculoskeletal: Remote left anterior first and second rib fractures. Remote anterior eighth, ninth, and tenth rib fractures with callus. Remote posterior tenth rib fracture. No evidence of acute rib fracture. Remote fractures of right anterior/lateral second through ninth ribs. No acute right rib fracture. No sternal fracture. No fracture of the thoracic spine, included clavicles or shoulder girdles. CT ABDOMEN PELVIS FINDINGS Hepatobiliary: Multiple hepatic cysts. No evidence of a Paddock injury or perihepatic hematoma. Layering hyperdensity in the gallbladder is likely sludge. No pericholecystic inflammation. No biliary dilatation. Pancreas: Pancreas is unremarkable. No peripancreatic inflammation. No evidence of pancreatic injury or pancreatic cyst. Spleen: Splenomegaly with spleen spanning 15.4 cm cranial caudal. No perisplenic hematoma or focal abnormality. Adrenals/Urinary Tract: No adrenal nodule. Polycystic kidneys which are enlarged with innumerable cysts of varying sizes. 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 There is no transplant  hydronephrosis. No evidence of renal injury. Urinary bladder is only minimally distended, partially obscured by streak artifact from left hip arthroplasty. No obvious focal bladder abnormality. Stomach/Bowel: Sigmoid colonic diverticulosis without diverticulitis. Mild colonic tortuosity. Small volume of colonic stool. Appendix is normal. Stomach and small bowel are unremarkable. No obstruction, inflammatory change, or evidence of injury. Vascular/Lymphatic: Aorto bi-iliac atherosclerosis. No aortic aneurysm. No retroperitoneal fluid. No adenopathy. Reproductive: Uterus partially obscured by streak artifact from left hip arthroplasty. No suspicious adnexal mass. Other: Small amount of fluid in the left inguinal canal is unchanged from 12/20/2019 CT. No ascites or free fluid in the abdomen. No free air. There is a small fat containing umbilical hernia. Musculoskeletal: Left hip arthroplasty. The bones are under mineralized. Remote left superior and inferior  pubic rami fractures. Remote bilateral sacral insufficiency fractures. No evidence of acute fracture. IMPRESSION: 1. No evidence of acute traumatic injury in the chest, abdomen, or pelvis. 2. Small bilateral pleural effusions with compressive atelectasis. 3. Multi chamber cardiomegaly. Small circumferential pericardial effusion. Dilated main pulmonary artery suggesting pulmonary arterial hypertension. 4. No acute findings in the abdomen/pelvis. 5. Polycystic kidneys and liver. Transplant kidney in the right iliac fossa without transplant hydronephrosis. 6. Splenomegaly. 7. Colonic diverticulosis without diverticulitis. 8. Remote bilateral rib fractures.  No acute rib fracture. Aortic Atherosclerosis (ICD10-I70.0). Electronically Signed   By: Keith Rake M.D.   On: 04/15/2020 21:39   DG Chest 1 View  Result Date: 04/16/2020 CLINICAL DATA:  Shortness of breath EXAM: CHEST  1 VIEW COMPARISON:  CT chest 04/15/2020 FINDINGS: Bilateral effusions are less well  visualized on today's examination. There is interstitial opacity throughout the lungs with a perihilar and basilar predominance with some fissural and septal thickening as well. The heart is enlarged. Pulmonary vascularity is cephalized indistinct with prominence of the central pulmonary arteries is again noted, better seen on comparison. No acute osseous or soft tissue abnormality. Telemetry leads overlie the chest. IMPRESSION: Findings highly suspicious for congestive heart failure with cardiomegaly, vascular congestion and bilateral effusions with interstitial opacities suggesting some developing interstitial edema. Electronically Signed   By: [name] M.D.   On: 04/16/2020 01:43   CT Head Wo Contrast  Result Date: 04/15/2020 CLINICAL DATA:  Status post trauma. EXAM: CT HEAD WITHOUT CONTRAST TECHNIQUE: Contiguous axial images were obtained from the base of the skull through the vertex without intravenous contrast. COMPARISON:  Apr 19, 2019 FINDINGS: Brain: No evidence of acute infarction, hemorrhage, hydrocephalus, extra-axial collection or mass lesion/mass effect. Vascular: No hyperdense vessel or unexpected calcification. Skull: Normal. Negative for fracture or focal lesion. Sinuses/Orbits: No acute finding. Other: None. IMPRESSION: No acute intracranial pathology. Electronically Signed   By: Virgina Norfolk M.D.   On: 04/15/2020 21:44   CT Chest Wo Contrast  Result Date: 04/15/2020 CLINICAL DATA:  Worsening shortness of breath. Increased nausea and vomiting. Fever. Patient reports with recent fall with left-sided rib pain. EXAM: CT CHEST, ABDOMEN AND PELVIS WITHOUT CONTRAST TECHNIQUE: Multidetector CT imaging of the chest, abdomen and pelvis was performed following the standard protocol without IV contrast. COMPARISON:  Radiograph earlier this day. Most recent chest CT 07/27/2014, most recent abdominal CT 03/31/2019 FINDINGS: CT CHEST FINDINGS Cardiovascular: No periaortic stranding to suggest  injury. Dilated main pulmonary artery at 4.1 cm. Small circumferential pericardial effusion. Multi chamber cardiomegaly. There are prominent venous collaterals anterior to the left axilla and in the included left upper extremity. Decreased density of the blood pool suggesting anemia. Mediastinum/Nodes: No mediastinal hematoma. No pneumomediastinum. Few scattered mediastinal lymph nodes that are not enlarged by size criteria. Limited assessment for hilar adenopathy in the absence of IV contrast. No esophageal wall thickening. Subcentimeter nodule in the left lobe of the thyroid gland, does not need further evaluation based on size. Lungs/Pleura: Small bilateral pleural effusions. Compressive atelectasis most prominent in the lower lobes. Additional subsegmental atelectasis in the lingula and left upper lobe. No pneumothorax. No evidence of pulmonary contusion. No pulmonary mass. There is mild central bronchial thickening. Musculoskeletal: Remote left anterior first and second rib fractures. Remote anterior eighth, ninth, and tenth rib fractures with callus. Remote posterior tenth rib fracture. No evidence of acute rib fracture. Remote fractures of right anterior/lateral second through ninth ribs. No acute right rib fracture. No sternal fracture. No fracture  of the thoracic spine, included clavicles or shoulder girdles. CT ABDOMEN PELVIS FINDINGS Hepatobiliary: Multiple hepatic cysts. No evidence of a Paddock injury or perihepatic hematoma. Layering hyperdensity in the gallbladder is likely sludge. No pericholecystic inflammation. No biliary dilatation. Pancreas: Pancreas is unremarkable. No peripancreatic inflammation. No evidence of pancreatic injury or pancreatic cyst. Spleen: Splenomegaly with spleen spanning 15.4 cm cranial caudal. No perisplenic hematoma or focal abnormality. Adrenals/Urinary Tract: No adrenal nodule. Polycystic kidneys which are enlarged with innumerable cysts of varying sizes. Transplant  kidney in the right iliac fossa. There is no transplant hydronephrosis. No evidence of renal injury. Urinary bladder is only minimally distended, partially obscured by streak artifact from left hip arthroplasty. No obvious focal bladder abnormality. Stomach/Bowel: Sigmoid colonic diverticulosis without diverticulitis. Mild colonic tortuosity. Small volume of colonic stool. Appendix is normal. Stomach and small bowel are unremarkable. No obstruction, inflammatory change, or evidence of injury. Vascular/Lymphatic: Aorto bi-iliac atherosclerosis. No aortic aneurysm. No retroperitoneal fluid. No adenopathy. Reproductive: Uterus partially obscured by streak artifact from left hip arthroplasty. No suspicious adnexal mass. Other: Small amount of fluid in the left inguinal canal is unchanged from 12/20/2019 CT. No ascites or free fluid in the abdomen. No free air. There is a small fat containing umbilical hernia. Musculoskeletal: Left hip arthroplasty. The bones are under mineralized. Remote left superior and inferior pubic rami fractures. Remote bilateral sacral insufficiency fractures. No evidence of acute fracture. IMPRESSION: 1. No evidence of acute traumatic injury in the chest, abdomen, or pelvis. 2. Small bilateral pleural effusions with compressive atelectasis. 3. Multi chamber cardiomegaly. Small circumferential pericardial effusion. Dilated main pulmonary artery suggesting pulmonary arterial hypertension. 4. No acute findings in the abdomen/pelvis. 5. Polycystic kidneys and liver. Transplant kidney in the right iliac fossa without transplant hydronephrosis. 6. Splenomegaly. 7. Colonic diverticulosis without diverticulitis. 8. Remote bilateral rib fractures.  No acute rib fracture. Aortic Atherosclerosis (ICD10-I70.0). Electronically Signed   By: Keith Rake M.D.   On: 04/15/2020 21:39   CT Cervical Spine Wo Contrast  Result Date: 04/15/2020 CLINICAL DATA:  Status post trauma. EXAM: CT CERVICAL SPINE  WITHOUT CONTRAST TECHNIQUE: Multidetector CT imaging of the cervical spine was performed without intravenous contrast. Multiplanar CT image reconstructions were also generated. COMPARISON:  None. FINDINGS: Alignment: Approximately 1 mm to 2 mm anterolisthesis of the C4 vertebral body is seen on C5. Skull base and vertebrae: No acute fracture. No primary bone lesion or focal pathologic process. Soft tissues and spinal canal: No prevertebral fluid or swelling. No visible canal hematoma. Disc levels: Mild endplate sclerosis is seen at the level of C5-C6. Moderate severity intervertebral disc space narrowing is also seen at this level. Upper chest: Negative. Other: None. IMPRESSION: 1. No acute osseous abnormality. 2. Approximately 1 mm to 2 mm anterolisthesis of the C4 vertebral body on C5. 3. Moderate severity degenerative changes at the level of C5-C6. Electronically Signed   By: Virgina Norfolk M.D.   On: 04/15/2020 21:26   CT L-SPINE NO CHARGE  Result Date: 04/15/2020 CLINICAL DATA:  Back pain. Recent fall. EXAM: CT LUMBAR SPINE WITHOUT CONTRAST TECHNIQUE: Multidetector CT imaging of the lumbar spine was performed without intravenous contrast administration. Multiplanar CT image reconstructions were also generated. COMPARISON:  Lumbar spine radiographs 12/19/2019. CT pelvis 12/20/2019. FINDINGS: Segmentation: 5 lumbar type vertebrae. Alignment: Normal. Vertebrae: Chronic bilateral sacral fractures with sclerosis and angular deformity of the sacrum anteriorly at the S2 level. Chronic bilateral L5 transverse process fractures. No acute lumbar spine fracture. Small Schmorl's nodes at T11-12  and T12-L1. Paraspinal and other soft tissues: No acute paraspinal soft tissue abnormality identified. Intra-abdominal and pelvic contents reported separately. Disc levels: Intervertebral disc space heights are preserved in the lumbar spine. Disc bulging and severe facet hypertrophy at L4-5 result in mild right greater than  left neural foraminal stenosis without evidence of significant spinal stenosis. There is mild disc bulging at L3-4 with minimal to mild right neural foraminal stenosis. IMPRESSION: 1. No acute osseous abnormality identified in the lumbar spine. 2. Chronic bilateral sacral fractures and chronic bilateral L5 transverse process fractures. 3. Severe L4-5 facet hypertrophy with mild right greater than left neural foraminal stenosis. Electronically Signed   By: Logan Bores M.D.   On: 04/15/2020 21:32   DG Chest Port 1 View  Result Date: 04/16/2020 CLINICAL DATA:  Shortness of breath EXAM: PORTABLE CHEST 1 VIEW COMPARISON:  Apr 16, 2020 study obtained earlier in the day FINDINGS: Heart is mildly enlarged with mild pulmonary venous hypertension. There is slight interstitial edema. No appreciable consolidation or pleural effusion. There is mild atelectatic change in the lower lung regions bilaterally. No adenopathy. No bone lesions. IMPRESSION: Mild cardiomegaly with a degree of pulmonary vascular congestion. Slight interstitial edema. There may be a degree of congestive heart failure. Suspect somewhat less edema compared to earlier in the day. No new opacity. No consolidation. Mild atelectatic change in each lower lung region. Electronically Signed   By: Lowella Grip III M.D.   On: 04/16/2020 08:46   DG Chest Portable 1 View  Result Date: 04/15/2020 CLINICAL DATA:  64 year old female with history of trauma from a fall complaining of left-sided rib pain. EXAM: PORTABLE CHEST 1 VIEW COMPARISON:  Chest x-ray 04/07/2020. FINDINGS: Lung volumes are low. Linear opacity in the left mid lung, new compared to the recent prior study, presumably a focus of subsegmental atelectasis. No acute consolidative airspace disease. No pleural effusions. Cephalization of the pulmonary vasculature, without frank pulmonary edema. Heart size is mildly enlarged. Upper mediastinal contours are within normal limits. An old healed  left-sided rib fractures are noted. No definite acute displaced rib fractures are identified. IMPRESSION: 1. Low lung volumes with new area of subsegmental atelectasis in the left mid lung. 2. Cardiomegaly with pulmonary venous congestion, but no frank pulmonary edema. 3. Multiple old healed left-sided rib fractures, similar to prior chest CT 07/27/2014. Electronically Signed   By: Vinnie Langton M.D.   On: 04/15/2020 19:23   DG Hip Unilat W or Wo Pelvis 2-3 Views Left  Result Date: 04/15/2020 CLINICAL DATA:  Status post fall 3 days ago. EXAM: DG HIP (WITH OR WITHOUT PELVIS) 2-3V LEFT COMPARISON:  February 01, 2020 FINDINGS: There is no evidence of an acute hip fracture or dislocation. Chronic fractures of the left superior and left inferior pubic rami are present. Fragmentation of the left greater trochanter is again noted. 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 There is no evidence of surrounding lucency to suggest the presence of hardware loosening or infection. There is no evidence of arthropathy or other focal bone abnormality. Radiopaque surgical clips are seen overlying the right acetabulum and inferior aspect of the sacrum on the right. IMPRESSION: 1. No evidence of an acute osseous abnormality. 2. 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 Electronically Signed   By: Virgina Norfolk M.D.   On: 04/15/2020 21:38        Scheduled Meds: . allopurinol  300 mg Oral Daily  . ascorbic acid  500 mg Oral Daily  .  aspirin  81 mg Oral Daily  . brimonidine  1 drop Both Eyes BID  . budesonide (PULMICORT) nebulizer solution  0.5 mg Nebulization BID  . cholecalciferol  1,000 Units Oral Daily  . cycloSPORINE  1 drop Both Eyes BID  . docusate sodium  100 mg Oral BID  . dorzolamide  1 drop Both Eyes Daily  . enoxaparin (LOVENOX) injection  40 mg Subcutaneous Q24H  . feeding supplement (ENSURE ENLIVE)  237 mL Oral BID BM  . feeding supplement (PRO-STAT SUGAR FREE 64)  30  mL Oral BID WC  . ferrous sulfate  325 mg Oral Q breakfast  . FLUoxetine  40 mg Oral Daily  . furosemide  40 mg Intravenous Q8H  . gabapentin  200 mg Oral TID  . latanoprost  1 drop Both Eyes QHS  . levalbuterol  1.25 mg Nebulization Q6H  . magnesium oxide  400 mg Oral BID  . methylPREDNISolone (SOLU-MEDROL) injection  40 mg Intravenous Q8H  . multivitamin with minerals  1 tablet Oral Daily  . mycophenolate  180 mg Oral BID  . pantoprazole  40 mg Oral BID  . sodium chloride flush  3 mL Intravenous Q12H  . tacrolimus  0.5 mg Oral QHS  . tacrolimus  1 mg Oral Daily  . valACYclovir  500 mg Oral BID  . verapamil  240 mg Oral Daily  . vitamin B-12  1,000 mcg Oral Daily   Continuous Infusions: . sodium chloride    . ceFEPime (MAXIPIME) IV 2 g (04/17/20 0548)     LOS: 1 day   Critical care time spent : 25 minutes examining the patient, discussing with RN, consultants as needed, coordinating care and management.The medical decision making on this patient was of high complexity, the critically ill patient is at high risk for clinical deterioration.       Barri Neidlinger Arsenio Loader, MD [facility]  If 7PM-7AM, please contact night-coverage  04/17/2020, 12:08 PM

## 2020-04-17 NOTE — Progress Notes (Signed)
Patient awake and alert at this time and taken off BiPAP. Placed back on 2L per home reg. RN to wait 30 minutes before oral meds, and will call me as needed

## 2020-04-17 NOTE — Progress Notes (Signed)
Echocardiogram 2D Echocardiogram has been performed.  April [name] 04/17/2020, 9:54 AM

## 2020-04-17 NOTE — NC FL2 (Signed)
[place] LEVEL OF CARE SCREENING TOOL     IDENTIFICATION  Patient Name: April [name] Birthdate: [birth year] Sex: female Admission Date (Current Location): 04/15/2020  [facility] and [place] Number:  Herbalist and Address:  [facility], [place] [address] [place], [place] [address]      Provider Number: [id number]  Attending Physician Name and Address:  Damita Lack, MD  Relative Name and Phone Number:  Ever Halberg    Current Level of Care: Hospital Recommended Level of Care: [place] Prior Approval Number:    Date Approved/Denied:   PASRR Number: [id number]  Discharge Plan: SNF    Current Diagnoses: Patient Active Problem List   Diagnosis Date Noted  . Sepsis due to gram-negative UTI ([place]) 04/16/2020  . Pericardial effusion 04/16/2020  . Chronic respiratory failure with hypoxia ([place]) 04/16/2020  . Proteinuria 04/16/2020  . Acute respiratory distress 04/16/2020  . Pressure injury of skin 04/08/2020  . COPD exacerbation ([place]) 04/07/2020  . Acute on chronic respiratory failure with hypoxia ([place]) 03/14/2020  . PNA (pneumonia) 03/13/2020  . SVT (supraventricular tachycardia) ([place])   . Anemia of chronic disease   . Palliative care encounter   . Goals of care, counseling/discussion   . Essential hypertension 02/08/2020  . Glaucoma 02/08/2020  . Sepsis ([place]) 01/31/2020  . Hypoxia   . Shortness of breath   . Fracture of left superior pubic ramus (HCC)   . Pain   . Anxiety state   . Left displaced femoral neck fracture ([place]) 12/15/2019  . Status post total hip replacement, left   . Post-op pain   . Acute blood loss anemia   . Polycystic kidney   . Closed left hip fracture ([place]) 12/07/2019  . Left wrist fracture 12/07/2019  . Medication management 09/22/2019  . Physical deconditioning 09/22/2019  . Hyponatremia 06/29/2019  . Acute respiratory failure with hypoxia ([place]) 04/19/2019  . Acute on  chronic diastolic CHF (congestive heart failure) ([place]) 04/19/2019  . Bilateral closed proximal tibial fracture 12/21/2018  . Closed right ankle fracture 12/21/2018  . Fracture 12/21/2018  . Lower urinary tract infectious disease 10/03/2018  . Asthma, chronic, unspecified asthma severity, with acute exacerbation 10/03/2018  . SIRS (systemic inflammatory response syndrome) ([place]) 10/03/2018  . Nausea vomiting and diarrhea 10/02/2018  . Chronic diastolic CHF (congestive heart failure) ([place]) 10/01/2017  . Influenza B 10/01/2017  . Persistent asthma with status asthmaticus   . Pneumonia 07/15/2016  . Asthma, mild intermittent 07/15/2016  . GERD without esophagitis 07/15/2016  . Renal transplant recipient 07/15/2016  . Hyperlipidemia 07/15/2016  . Depression 07/15/2016  . Bronchitis, mucopurulent recurrent ([place]) 08/08/2014  . Chronic cough 07/24/2014  . End stage renal disease ([place]) 03/23/2012  . 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, implant, and graft 03/23/2012    Orientation RESPIRATION BLADDER Height & Weight     Self, Time, Situation, Place  O2(Nasal cannula 2L) Incontinent, External catheter Weight: 158 lb 15.2 oz (72.1 kg) Height:  5\' 1"  (154.9 cm)  BEHAVIORAL SYMPTOMS/MOOD NEUROLOGICAL BOWEL NUTRITION STATUS      Continent Diet(Please see DC Summary)  AMBULATORY STATUS COMMUNICATION OF NEEDS Skin   Extensive Assist Verbally PU Stage and Appropriate Care(Stage II on buttocks)                       Personal Care Assistance Level of Assistance  Bathing, Feeding, Dressing Bathing Assistance: Maximum assistance Feeding assistance: Limited assistance  Dressing Assistance: Limited assistance     Functional Limitations Info  Sight, Hearing, Speech Sight Info: Impaired Hearing Info: Adequate Speech Info: Adequate    SPECIAL CARE FACTORS FREQUENCY  PT (By licensed PT), OT (By licensed OT)     PT Frequency: 5x/week OT Frequency: 5x/week             Contractures Contractures Info: Not present    Additional Factors Info  Code Status, Allergies, Psychotropic Code Status Info: Full Allergies Info: Infed (Iron Dextran), Pentamidine, Erythromycin (Erythromycin), Iohexol, Oxycodone, Erythromycin, Ultram (Tramadol Hcl) Psychotropic Info: Prozac         Current Medications (04/17/2020):  This is the current hospital active medication list Current Facility-Administered Medications  Medication Dose Route Frequency Provider Last Rate Last Admin  . 0.9 %  sodium chloride infusion  250 mL Intravenous PRN Shalhoub, Sherryll Burger, MD      . acetaminophen (TYLENOL) tablet 650 mg  650 mg Oral Q4H PRN Shalhoub, Sherryll Burger, MD      . allopurinol (ZYLOPRIM) tablet 300 mg  300 mg Oral Daily Shalhoub, Sherryll Burger, MD   300 mg at 04/17/20 1053  . ascorbic acid (VITAMIN C) tablet 500 mg  500 mg Oral Daily Shalhoub, Sherryll Burger, MD   500 mg at 04/17/20 1053  . aspirin chewable tablet 81 mg  81 mg Oral Daily Vernelle Emerald, MD   81 mg at 04/17/20 1053  . brimonidine (ALPHAGAN) 0.15 % ophthalmic solution 1 drop  1 drop Both Eyes BID Shalhoub, Sherryll Burger, MD   1 drop at 04/17/20 1032  . budesonide (PULMICORT) nebulizer solution 0.5 mg  0.5 mg Nebulization BID Amin, Ankit Chirag, MD   0.5 mg at 04/17/20 0905  . ceFEPIme (MAXIPIME) 2 g in sodium chloride 0.9 % 100 mL IVPB  2 g Intravenous Q8H Shalhoub, Sherryll Burger, MD 200 mL/hr at 04/17/20 1352 2 g at 04/17/20 1352  . cholecalciferol (VITAMIN D3) tablet 1,000 Units  1,000 Units Oral Daily Shalhoub, Sherryll Burger, MD   1,000 Units at 04/17/20 1053  . cycloSPORINE (RESTASIS) 0.05 % ophthalmic emulsion 1 drop  1 drop Both Eyes BID Shalhoub, Sherryll Burger, MD   1 drop at 04/17/20 1104  . dextromethorphan-guaiFENesin (MUCINEX DM) 30-600 MG per 12 hr tablet 1 tablet  1 tablet Oral BID PRN Amin, Ankit Chirag, MD      . docusate sodium (COLACE) capsule 100 mg  100 mg Oral BID Shalhoub, Sherryll Burger, MD      . dorzolamide (TRUSOPT) 2 % ophthalmic  solution 1 drop  1 drop Both Eyes Daily Shalhoub, Sherryll Burger, MD   1 drop at 04/17/20 1032  . enoxaparin (LOVENOX) injection 40 mg  40 mg Subcutaneous Q24H Shalhoub, Sherryll Burger, MD   40 mg at 04/17/20 0748  . feeding supplement (ENSURE ENLIVE) (ENSURE ENLIVE) liquid 237 mL  237 mL Oral BID BM Shalhoub, Sherryll Burger, MD   237 mL at 04/17/20 1104  . feeding supplement (PRO-STAT SUGAR FREE 64) liquid 30 mL  30 mL Oral BID WC Shalhoub, Sherryll Burger, MD      . ferrous sulfate tablet 325 mg  325 mg Oral Q breakfast Shalhoub, Sherryll Burger, MD   325 mg at 04/17/20 1053  . FLUoxetine (PROZAC) capsule 40 mg  40 mg Oral Daily Shalhoub, Sherryll Burger, MD   40 mg at 04/17/20 1053  . furosemide (LASIX) injection 40 mg  40 mg Intravenous Q8H Amin, Ankit Chirag, MD   40 mg at  04/17/20 1339  . gabapentin (NEURONTIN) capsule 200 mg  200 mg Oral TID Vernelle Emerald, MD   200 mg at 04/17/20 1646  . hydrOXYzine (ATARAX/VISTARIL) tablet 10 mg  10 mg Oral BID PRN Vernelle Emerald, MD   10 mg at 04/17/20 1339  . ipratropium (ATROVENT) nebulizer solution 0.5 mg  0.5 mg Nebulization Q6H PRN Amin, Ankit Chirag, MD      . latanoprost (XALATAN) 0.005 % ophthalmic solution 1 drop  1 drop Both Eyes QHS Shalhoub, Sherryll Burger, MD   1 drop at 04/16/20 2131  . levalbuterol (XOPENEX) nebulizer solution 1.25 mg  1.25 mg Nebulization Q6H Amin, Ankit Chirag, MD   1.25 mg at 04/17/20 1439  . magnesium oxide (MAG-OX) tablet 400 mg  400 mg Oral BID Vernelle Emerald, MD   400 mg at 04/17/20 1053  . methylPREDNISolone sodium succinate (SOLU-MEDROL) 40 mg/mL injection 40 mg  40 mg Intravenous Q12H Amin, Ankit Chirag, MD   40 mg at 04/17/20 1656  . multivitamin with minerals tablet 1 tablet  1 tablet Oral Daily Shalhoub, Sherryll Burger, MD   1 tablet at 04/17/20 1053  . mycophenolate (MYFORTIC) EC tablet 180 mg  180 mg Oral BID Vernelle Emerald, MD   180 mg at 04/17/20 1054  . ondansetron (ZOFRAN) injection 4 mg  4 mg Intravenous Q6H PRN Shalhoub, Sherryll Burger, MD   4  mg at 04/16/20 0217  . pantoprazole (PROTONIX) EC tablet 40 mg  40 mg Oral BID Vernelle Emerald, MD   40 mg at 04/17/20 1052  . polyethylene glycol (MIRALAX / GLYCOLAX) packet 17 g  17 g Oral Daily PRN Amin, Ankit Chirag, MD      . promethazine (PHENERGAN) injection 6.25 mg  6.25 mg Intravenous Q6H PRN Skeet Simmer, RPH      . senna-docusate (Senokot-S) tablet 2 tablet  2 tablet Oral QHS PRN Amin, Ankit Chirag, MD      . sodium chloride flush (NS) 0.9 % injection 3 mL  3 mL Intravenous Q12H Shalhoub, Sherryll Burger, MD   3 mL at 04/17/20 1034  . sodium chloride flush (NS) 0.9 % injection 3 mL  3 mL Intravenous PRN Shalhoub, Sherryll Burger, MD      . tacrolimus (PROGRAF) capsule 0.5 mg  0.5 mg Oral QHS Shalhoub, Sherryll Burger, MD      . tacrolimus (PROGRAF) capsule 1 mg  1 mg Oral Daily Shalhoub, Sherryll Burger, MD   1 mg at 04/17/20 1138  . valACYclovir (VALTREX) tablet 500 mg  500 mg Oral BID Vernelle Emerald, MD   500 mg at 04/17/20 1053  . verapamil (CALAN-SR) CR tablet 240 mg  240 mg Oral Daily Shalhoub, Sherryll Burger, MD   240 mg at 04/17/20 1053  . vitamin B-12 (CYANOCOBALAMIN) tablet 1,000 mcg  1,000 mcg Oral Daily Shalhoub, Sherryll Burger, MD   1,000 mcg at 04/17/20 1053     Discharge Medications: Please see discharge summary for a list of discharge medications.  Relevant Imaging Results:  Relevant Lab Results:   Additional Information SSN [id number]    COVID negative on 04/15/20  Benard Halsted, LCSW

## 2020-04-18 LAB — BASIC METABOLIC PANEL
Anion gap: 10 (ref 5–15)
BUN: 62 mg/dL — ABNORMAL HIGH (ref 8–23)
CO2: 26 mmol/L (ref 22–32)
Calcium: 9.6 mg/dL (ref 8.9–10.3)
Chloride: 101 mmol/L (ref 98–111)
Creatinine, Ser: 1.15 mg/dL — ABNORMAL HIGH (ref 0.44–1.00)
GFR calc Af Amer: 59 mL/min — ABNORMAL LOW (ref >60)
GFR calc non Af Amer: 51 mL/min — ABNORMAL LOW (ref >60)
Glucose, Bld: 190 mg/dL — ABNORMAL HIGH (ref 70–99)
Potassium: 4 mmol/L (ref 3.5–5.1)
Sodium: 137 mmol/L (ref 135–145)

## 2020-04-18 LAB — CBC
HCT: 22.6 % — ABNORMAL LOW (ref 36.0–46.0)
Hemoglobin: 6.6 g/dL — CL (ref 12.0–15.0)
MCH: 29.3 pg (ref 26.0–34.0)
MCHC: 29.2 g/dL — ABNORMAL LOW (ref 30.0–36.0)
MCV: 100.4 fL — ABNORMAL HIGH (ref 80.0–100.0)
Platelets: 177 10*3/uL (ref 150–400)
RBC: 2.25 MIL/uL — ABNORMAL LOW (ref 3.87–5.11)
RDW: 23.8 % — ABNORMAL HIGH (ref 11.5–15.5)
WBC: 8.3 10*3/uL (ref 4.0–10.5)
nRBC: 0.2 % (ref 0.0–0.2)

## 2020-04-18 LAB — HEMOGLOBIN AND HEMATOCRIT, BLOOD
HCT: 26.6 % — ABNORMAL LOW (ref 36.0–46.0)
Hemoglobin: 7.9 g/dL — ABNORMAL LOW (ref 12.0–15.0)

## 2020-04-18 LAB — URINE CULTURE: Culture: 3000 — AB

## 2020-04-18 LAB — PREPARE RBC (CROSSMATCH)

## 2020-04-18 LAB — MAGNESIUM: Magnesium: 2.1 mg/dL (ref 1.7–2.4)

## 2020-04-18 MED ORDER — SODIUM CHLORIDE 0.9% IV SOLUTION: Freq: Once | INTRAVENOUS | Status: AC

## 2020-04-18 MED ORDER — SODIUM CHLORIDE 0.9 % IV SOLN
2.0000 g | Freq: Two times a day (BID) | INTRAVENOUS | Status: DC
  Administered 2020-04-18 – 2020-04-19 (×2): 2 g via INTRAVENOUS
  Filled 2020-04-18 (×2): qty 2

## 2020-04-18 NOTE — Evaluation (Signed)
Occupational Therapy Evaluation Patient Details Name: April [name] MRN: [id number] DOB: 1956-03-15 Today's Date: 04/18/2020    History of Present Illness 64 year old female with past medical history of polycystic kidney disease status post right kidney transplant, COPD, diastolic congestive heart failure and chronic hypoxic respiratory failure on 2 L of oxygen via nasal cannula, hypertension, anxiety disorder, obesity who presents to [facility] emergency department with complaints of shortness of breath nausea and vomiting. Pt found to have UTI and started on broad-spectrum antibiotics.   Clinical Impression   PTA pt at SNF for short time, able to walk short distances with RW and received assist for BADL. At time of eval, pt completed bed mobility at mod A level. Once EOB pt washed tops of thighs with set up assist, and ultimately needed max A for posterior peri care. Stedy was then used to complete transfer to chair with mod A +2 to rise from low surface. Pt able to stand with min A once in stedy. She endorses L rib, hip, and buttocks pain. She states she has a pressure ulcer forming on her sacrum. Educated pt on importance of peri care and position changing to maintain skin integrity. Pt frequently dizzy and nauseous throughout session with VSS. Recommend pt return to SNF for continued rehab. Will continue to follow per POC listed below.     Follow Up Recommendations  SNF;Supervision/Assistance - 24 hour    Equipment Recommendations  None recommended by OT    Recommendations for Other Services       Precautions / Restrictions Precautions Precautions: Fall Restrictions Weight Bearing Restrictions: No      Mobility Bed Mobility Overal bed mobility: Needs Assistance Bed Mobility: Rolling;Supine to Sit;Sit to Supine     Supine to sit: Mod assist     General bed mobility comments: modA to scoot to edge of bed, assistance for LE management  Transfers Overall transfer  level: Needs assistance Equipment used: Ambulation equipment used Transfers: Sit to/from Stand Sit to Stand: Mod assist;+2 physical assistance         General transfer comment: mod A +2 to rise from bed into stedy. Min A +1 to stand once in stedy    Balance Overall balance assessment: Needs assistance Sitting-balance support: Single extremity supported;Feet unsupported Sitting balance-Leahy Scale: Fair     Standing balance support: Bilateral upper extremity supported Standing balance-Leahy Scale: Poor Standing balance comment: reliant on external support                           ADL either performed or assessed with clinical judgement   ADL Overall ADL's : Needs assistance/impaired Eating/Feeding: Set up;Sitting   Grooming: Set up;Sitting   Upper Body Bathing: Minimal assistance;Sitting   Lower Body Bathing: Maximal assistance;Sit to/from stand;Sitting/lateral leans Lower Body Bathing Details (indicate cue type and reason): was able to wash tops of thighs seated EOB Upper Body Dressing : Minimal assistance;Sitting   Lower Body Dressing: Sit to/from stand;Sitting/lateral leans;Total assistance Lower Body Dressing Details (indicate cue type and reason): total A to manage socks this date. Pt also requiring external support to steady self Toilet Transfer: Moderate assistance;+2 for physical assistance;+2 for safety/equipment Toilet Transfer Details (indicate cue type and reason): mod A +2 to rise off of bed and into stedy for transfer Toileting- Clothing Manipulation and Hygiene: Maximal assistance;Sit to/from stand Toileting - Clothing Manipulation Details (indicate cue type and reason): pt able to complete <25% of anterior peri care  while OT assisted with posterior     Functional mobility during ADLs: Moderate assistance;+2 for physical assistance;+2 for safety/equipment(stedy)       Vision Patient Visual Report: No change from baseline       Perception      Praxis      Pertinent Vitals/Pain Pain Assessment: Faces Faces Pain Scale: Hurts little more Pain Location: hip and buttocks Pain Descriptors / Indicators: Grimacing Pain Intervention(s): Monitored during session;Repositioned     Hand Dominance Right   Extremity/Trunk Assessment Upper Extremity Assessment Upper Extremity Assessment: Generalized weakness   Lower Extremity Assessment Lower Extremity Assessment: Defer to PT evaluation       Communication Communication Communication: No difficulties   Cognition Arousal/Alertness: Awake/alert Behavior During Therapy: WFL for tasks assessed/performed Overall Cognitive Status: Impaired/Different from baseline Area of Impairment: Problem solving                             Problem Solving: Slow processing;Decreased initiation General Comments: increased time and cues needed to engage in basic mobility tasks   General Comments       Exercises     Shoulder Instructions      Home Living Family/patient expects to be discharged to:: Skilled nursing facility                                 Additional Comments: pt coming from rehab at [facility]      Prior Functioning/Environment Level of Independence: Needs assistance  Gait / Transfers Assistance Needed: pt working with PT at SNF, was able to ambulate 10' with RW and assistance a few days prior to admission, did have a fall at SNF and is anxious bout falling again. Was independent with RW prior to multiple hospital admissions ADL's / Homemaking Assistance Needed: pt requires assistance for lower body ADLs            OT Problem List: Decreased strength;Decreased activity tolerance;Impaired balance (sitting and/or standing);Decreased coordination;Decreased knowledge of use of DME or AE      OT Treatment/Interventions: Self-care/ADL training;Therapeutic exercise;Patient/family education;Balance training;Energy conservation;Therapeutic  activities;DME and/or AE instruction    OT Goals(Current goals can be found in the care plan section) Acute Rehab OT Goals Patient Stated Goal: to improve strength and eventually go back home OT Goal Formulation: With patient Time For Goal Achievement: 05/02/20 Potential to Achieve Goals: Good  OT Frequency: Min 2X/week   Barriers to D/C:            Co-evaluation PT/OT/SLP Co-Evaluation/Treatment: Yes Reason for Co-Treatment: For patient/therapist safety;To address functional/ADL transfers   OT goals addressed during session: ADL's and self-care;Proper use of [employer] equipment and DME      AM-PAC OT "6 Clicks" Daily Activity     Outcome Measure Help from another person eating meals?: A Little Help from another person taking care of personal grooming?: A Little Help from another person toileting, which includes using toliet, bedpan, or urinal?: A Lot Help from another person bathing (including washing, rinsing, drying)?: A Lot Help from another person to put on and taking off regular upper body clothing?: A Little Help from another person to put on and taking off regular lower body clothing?: Total 6 Click Score: 14   End of Session Equipment Utilized During Treatment: Oxygen Nurse Communication: Mobility status  Activity Tolerance: Patient tolerated treatment well Patient left: in chair;with call bell/phone  within reach  OT Visit Diagnosis: Muscle weakness (generalized) (M62.81);History of falling (Z91.81);Unsteadiness on feet (R26.81)                Time: 5686-1683 OT Time Calculation (min): 28 min Charges:  OT General Charges $OT Visit: 1 Visit OT Evaluation $OT Eval Moderate Complexity: [place], MSOT, OTR/L [place] [facility] Office Number: [phone] Pager: [phone]  Zenovia Jarred 04/18/2020, 5:52 PM

## 2020-04-18 NOTE — Progress Notes (Signed)
PROGRESS NOTE    April [name]  UXL:[id number] DOB: [birth year] DOA: 04/15/2020 PCP: [name], MD   Brief Narrative:  64 year old female with past medical history of polycystic kidney disease status post right kidney transplant, COPD, diastolic congestive heart failure and chronic hypoxic respiratory failure on 2 L of oxygen via nasal cannula, hypertension, anxiety disorder, obesity who presents to [facility] emergency department with complaints of shortness of breath nausea and vomiting.  Recurrent admissions for COPD exacerbation.  She was found to be febrile with suggestion of urinary tract infection.  Started on broad-spectrum antibiotics.  Urine cultures grew Klebsiella.    Assessment & Plan:   Principal Problem:   Sepsis due to gram-negative UTI ([place]) Active Problems:   GERD without esophagitis   Renal transplant recipient   Acute on chronic diastolic CHF (congestive heart failure) (HCC)   Anemia of chronic disease   COPD exacerbation (HCC)   Pericardial effusion   Chronic respiratory failure with hypoxia (HCC)   Proteinuria   Acute respiratory distress   Sepsis secondary to urinary tract infection -Urine cultures growing Klebsiella, sensitivities  -  Cefepime.  -Continue IV cefepime. Last Day 5/29 to complete 5 day course.  -Supportive care.  Acute on chronic hypoxic respiratory failure, 2 L nasal cannula at baseline Acute COPD exacerbation -Breathing is greatly improved.  Currently on 2-3 L of nasal cannula.  BiPAP as needed. -Continue Solu-Medrol IV 40mg  q12hrs, scheduled and as needed bronchodilators -ABG.  Acute diastolic congestive heart failure, echo showed an EF of 55 to 60%, class II -Strict input and output.  Monitor electrolytes. Hold lasix today.  -No pericardial effusion seen on the echocardiogram  AKI -Baseline creatinine 0.8.  Today is 1.1.  Hold off on further diuretics.  GERD without esophagitis-continue PPI  Anemia of chronic  disease, macrocytic All cell lines down, Hb 6.6. 1U PRBC ordered.  Per patient she is due for outpatient colonoscopy.  Will monitor hemoglobin here, if continues to drop further will get GI involved. Follows Dr Collene Mares  History of renal transplant -On tacrolimus, mycophenolate.  History of SVT -Verapamil   DVT prophylaxis: Lovenox Code Status: Full code Family Communication: None  Status is: Inpatient  Remains inpatient appropriate because:Hemodynamically unstable   Dispo: The patient is from: Home              Anticipated d/c is to: SNF              Anticipated d/c date is: 2 days              Patient currently is not medically stable to d/c.  Maintain hospital stay for PRBC transfusion, monitoring her hemoglobin and renal function.  Hopefully discharge next 24-48 hours to SNF.   Subjective: Patient feels okay she denies any complaints.  Denies any evidence of obvious blood loss.  Denies any nausea or vomiting.  Tells me she is due for outpatient colonoscopy with Dr. Collene Mares.  Review of Systems Otherwise negative except as per HPI, including: General = no fevers, chills, dizziness,  fatigue HEENT/EYES = negative for loss of vision, double vision, blurred vision,  sore throa Cardiovascular= negative for chest pain, palpitation Respiratory/lungs= negative for shortness of breath, cough, wheezing; hemoptysis,  Gastrointestinal= negative for nausea, vomiting, abdominal pain Genitourinary= negative for Dysuria MSK = Negative for arthralgia, myalgias Neurology= Negative for headache, numbness, tingling  Psychiatry= Negative for suicidal and homocidal ideation Skin= Negative for Rash  Examination:  Constitutional: Not in acute distress; 2  L [place] Respiratory: bibasilar rhonchi Cardiovascular: Normal sinus rhythm, no rubs Abdomen: Nontender nondistended good bowel sounds Musculoskeletal: No edema noted Skin: No rashes seen Neurologic: CN 2-12 grossly intact.  And  nonfocal Psychiatric: Normal judgment and insight. Alert and oriented x 3. Normal mood.    Objective: Vitals:   04/18/20 0928 04/18/20 1004 04/18/20 1029 04/18/20 1254  BP:  125/68 113/61 125/76  Pulse:  89 82 82  Resp:  19 (!) 21 19  Temp:  97.8 F (36.6 C) 98.2 F (36.8 C) 98.1 F (36.7 C)  TempSrc:  Oral Oral Oral  SpO2: 100% 93%  100%  Weight:      Height:        Intake/Output Summary (Last 24 hours) at 04/18/2020 1428 Last data filed at 04/18/2020 1254 Gross per 24 hour  Intake 836.5 ml  Output 550 ml  Net 286.5 ml   Filed Weights   04/16/20 1801 04/17/20 0400 04/18/20 0400  Weight: 72.9 kg 72.1 kg 71.5 kg     Data Reviewed:   CBC: Recent Labs  Lab 04/15/20 2005 04/16/20 0608 04/16/20 0943 04/17/20 0355 04/18/20 0605  WBC 22.9* 24.9*  --  12.5* 8.3  NEUTROABS 20.4* 23.8*  --   --   --   HGB 8.3* 8.2* 8.5* 7.3* 6.6*  HCT 28.1* 27.9* 25.0* 24.6* 22.6*  MCV 100.7* 100.7*  --  100.4* 100.4*  PLT 234 219  --  220 425   Basic Metabolic Panel: Recent Labs  Lab 04/15/20 2005 04/16/20 0608 04/16/20 0943 04/17/20 0355 04/18/20 0605  NA 134* 138 136 142 137  K 4.1 4.6 4.3 4.3 4.0  CL 98 99  --  101 101  CO2 26 25  --  26 26  GLUCOSE 136* 149*  --  159* 190*  BUN 21 24*  --  41* 62*  CREATININE 0.77 0.86  --  1.02* 1.15*  CALCIUM 9.5 9.3  --  9.7 9.6  MG  --  1.5*  --  1.8 2.1   GFR: Estimated Creatinine Clearance: 45.9 mL/min (A) (by C-G formula based on SCr of 1.15 mg/dL (H)). Liver Function Tests: Recent Labs  Lab 04/15/20 2005 04/16/20 0608  AST 21 26  ALT 14 15  ALKPHOS 84 79  BILITOT 1.2 2.1*  PROT 7.9 7.2  ALBUMIN 2.5* 2.3*   No results for input(s): LIPASE, AMYLASE in the last 168 hours. No results for input(s): AMMONIA in the last 168 hours. Coagulation Profile: Recent Labs  Lab 04/15/20 2005 04/16/20 9563  INR 1.4* 1.4*   Cardiac Enzymes: No results for input(s): CKTOTAL, CKMB, CKMBINDEX, TROPONINI in the last 168  hours. BNP (last 3 results) No results for input(s): PROBNP in the last 8760 hours. HbA1C: No results for input(s): HGBA1C in the last 72 hours. CBG: Recent Labs  Lab 04/11/20 2047  GLUCAP 115*   Lipid Profile: No results for input(s): CHOL, HDL, LDLCALC, TRIG, CHOLHDL, LDLDIRECT in the last 72 hours. Thyroid Function Tests: Recent Labs    04/16/20 0608  TSH 2.809   Anemia Panel: No results for input(s): VITAMINB12, FOLATE, FERRITIN, TIBC, IRON, RETICCTPCT in the last 72 hours. Sepsis Labs: Recent Labs  Lab 04/15/20 2005 04/15/20 2254  LATICACIDVEN 1.1 1.0    Recent Results (from the past 240 hour(s))  SARS Coronavirus 2 by RT PCR (hospital order, performed in [facility] hospital lab) Nasopharyngeal Nasopharyngeal Swab     Status: None   Collection Time: 04/11/20 11:23 AM   Specimen:  Nasopharyngeal Swab  Result Value Ref Range Status   SARS Coronavirus 2 NEGATIVE NEGATIVE Final    Comment: (NOTE) SARS-CoV-2 target nucleic acids are NOT DETECTED. The SARS-CoV-2 RNA is generally detectable in upper and lower respiratory specimens during the acute phase of infection. The lowest concentration of SARS-CoV-2 viral copies this assay can detect is 250 copies / mL. A negative result does not preclude SARS-CoV-2 infection and should not be used as the sole basis for treatment or other patient management decisions.  A negative result may occur with improper specimen collection / handling, submission of specimen other than nasopharyngeal swab, presence of viral mutation(s) within the areas targeted by this assay, and inadequate number of viral copies (<250 copies / mL). A negative result must be combined with clinical observations, patient history, and epidemiological information. Fact Sheet for Patients:   StrictlyIdeas.no Fact Sheet for Healthcare Providers: BankingDealers.co.za This test is not yet approved or cleared  by the  [place] FDA and has been authorized for detection and/or diagnosis of SARS-CoV-2 by FDA under an Emergency Use Authorization (EUA).  This EUA will remain in effect (meaning this test can be used) for the duration of the COVID-19 declaration under Section 564(b)(1) of the Act, 21 U.S.C. section 360bbb-3(b)(1), unless the authorization is terminated or revoked sooner. Performed at [facility], [place] [address], [name], [place] [address]   Blood Culture (routine x 2)     Status: None (Preliminary result)   Collection Time: 04/15/20  8:05 PM   Specimen: BLOOD  Result Value Ref Range Status   Specimen Description BLOOD RIGHT ANTECUBITAL  Final   Special Requests   Final    BOTTLES DRAWN AEROBIC AND ANAEROBIC Blood Culture adequate volume   Culture   Final    NO GROWTH 3 DAYS Performed at [facility], [place] [address], [place], [place] [address]    Report Status PENDING  Incomplete  Blood Culture (routine x 2)     Status: None (Preliminary result)   Collection Time: 04/15/20  8:25 PM   Specimen: BLOOD RIGHT WRIST  Result Value Ref Range Status   Specimen Description BLOOD RIGHT WRIST  Final   Special Requests   Final    BOTTLES DRAWN AEROBIC AND ANAEROBIC Blood Culture adequate volume   Culture   Final    NO GROWTH 3 DAYS Performed at [facility], [place] [address], [place], [place] [address]    Report Status PENDING  Incomplete  SARS Coronavirus 2 by RT PCR (hospital order, performed in [facility] hospital lab) Nasopharyngeal Nasopharyngeal Swab     Status: None   Collection Time: 04/15/20  8:25 PM   Specimen: Nasopharyngeal Swab  Result Value Ref Range Status   SARS Coronavirus 2 NEGATIVE NEGATIVE Final    Comment: (NOTE) SARS-CoV-2 target nucleic acids are NOT DETECTED. The SARS-CoV-2 RNA is generally detectable in upper and lower respiratory specimens during the acute phase of infection. The lowest concentration of SARS-CoV-2 viral copies this assay can  detect is 250 copies / mL. A negative result does not preclude SARS-CoV-2 infection and should not be used as the sole basis for treatment or other patient management decisions.  A negative result may occur with improper specimen collection / handling, submission of specimen other than nasopharyngeal swab, presence of viral mutation(s) within the areas targeted by this assay, and inadequate number of viral copies (<250 copies / mL). A negative result must be combined with clinical observations, patient  history, and epidemiological information. Fact Sheet for Patients:   StrictlyIdeas.no Fact Sheet for Healthcare Providers: BankingDealers.co.za This test is not yet approved or cleared  by the [place] FDA and has been authorized for detection and/or diagnosis of SARS-CoV-2 by FDA under an Emergency Use Authorization (EUA).  This EUA will remain in effect (meaning this test can be used) for the duration of the COVID-19 declaration under Section 564(b)(1) of the Act, 21 U.S.C. section 360bbb-3(b)(1), unless the authorization is terminated or revoked sooner. Performed at [facility], [place] [address] [place], [place] [address]   Urine culture     Status: Abnormal   Collection Time: 04/15/20 10:17 PM   Specimen: In/Out Cath Urine  Result Value Ref Range Status   Specimen Description IN/OUT CATH URINE  Final   Special Requests   Final    NONE Performed at [facility], [place] [address] [place], [place] [address]    Culture 3,000 COLONIES/mL KLEBSIELLA ORNITHINOLYTICA (A)  Final   Report Status 04/18/2020 FINAL  Final   Organism ID, Bacteria KLEBSIELLA ORNITHINOLYTICA (A)  Final      Susceptibility   Klebsiella ornithinolytica - MIC*    AMPICILLIN >=32 RESISTANT Resistant     CEFAZOLIN >=64 RESISTANT Resistant     CEFEPIME 2 SENSITIVE Sensitive     CEFTAZIDIME 16 INTERMEDIATE Intermediate     CEFTRIAXONE >=64 RESISTANT  Resistant     CIPROFLOXACIN 2 INTERMEDIATE Intermediate     GENTAMICIN >=16 RESISTANT Resistant     IMIPENEM 0.5 SENSITIVE Sensitive     NITROFURANTOIN <=16 SENSITIVE Sensitive     TRIMETH/SULFA >=320 RESISTANT Resistant     AMPICILLIN/SULBACTAM >=32 RESISTANT Resistant     PIP/TAZO 32 INTERMEDIATE Intermediate     * 3,000 COLONIES/mL KLEBSIELLA ORNITHINOLYTICA         Radiology Studies: ECHOCARDIOGRAM COMPLETE  Result Date: 04/17/2020    ECHOCARDIOGRAM REPORT   Patient Name:   April [name] Date of Exam: 04/17/2020 Medical Rec #:  [id number]         Height:       61.0 in Accession #:    [id number]        Weight:       159.0 lb Date of Birth:  1955/11/29         BSA:          1.713 m Patient Age:    67 years          BP:           138/74 mmHg Patient Gender: F                 HR:           85 bpm. Exam Location:  Inpatient Procedure: 2D Echo, Color Doppler and Cardiac Doppler Indications:    T25.49 Acute diastolic (congestive) heart failure  History:        Patient has prior history of Echocardiogram examinations, most                 recent 04/21/2019. CHF, COPD; Risk Factors:Dyslipidemia.  Sonographer:    Raquel Sarna Senior RDCS Referring Phys: [id number] Sherryll Burger [facility]  Sonographer Comments: Supine on CPAP. IMPRESSIONS  1. Left ventricular ejection fraction, by estimation, is 55 to 60%. The left ventricle has normal function. The left ventricle has no regional wall motion abnormalities. Left ventricular diastolic parameters are consistent with Grade II diastolic dysfunction (pseudonormalization). Elevated left atrial pressure.  2. Right  ventricular systolic function is normal. The right ventricular size is normal. There is mildly elevated pulmonary artery systolic pressure. The estimated right ventricular systolic pressure is 56.3 mmHg.  3. Left atrial size was mildly dilated.  4. The mitral valve is degenerative. Trivial mitral valve regurgitation. No evidence of mitral stenosis.  5. The aortic  valve is tricuspid. Aortic valve regurgitation is not visualized. No aortic stenosis is present.  6. The inferior vena cava is dilated in size with <50% respiratory variability, suggesting right atrial pressure of 15 mmHg. Comparison(s): No significant change from prior study. FINDINGS  Left Ventricle: Left ventricular ejection fraction, by estimation, is 55 to 60%. The left ventricle has normal function. The left ventricle has no regional wall motion abnormalities. The left ventricular internal cavity size was normal in size. There is  no left ventricular hypertrophy. Left ventricular diastolic parameters are consistent with Grade II diastolic dysfunction (pseudonormalization). Elevated left atrial pressure. Right Ventricle: The right ventricular size is normal. No increase in right ventricular wall thickness. Right ventricular systolic function is normal. There is mildly elevated pulmonary artery systolic pressure. The tricuspid regurgitant velocity is 2.62  m/s, and with an assumed right atrial pressure of 15 mmHg, the estimated right ventricular systolic pressure is 14.9 mmHg. Left Atrium: Left atrial size was mildly dilated. Right Atrium: Right atrial size was normal in size. Pericardium: Trivial pericardial effusion is present. Presence of pericardial fat pad. Mitral Valve: The mitral valve is degenerative in appearance. Moderate mitral annular calcification. Trivial mitral valve regurgitation. No evidence of mitral valve stenosis. Tricuspid Valve: The tricuspid valve is grossly normal. Tricuspid valve regurgitation is trivial. No evidence of tricuspid stenosis. Aortic Valve: The aortic valve is tricuspid. Aortic valve regurgitation is not visualized. No aortic stenosis is present. Pulmonic Valve: The pulmonic valve was grossly normal. Pulmonic valve regurgitation is not visualized. No evidence of pulmonic stenosis. Aorta: The aortic root and ascending aorta are structurally normal, with no evidence of  dilitation. Venous: The inferior vena cava is dilated in size with less than 50% respiratory variability, suggesting right atrial pressure of 15 mmHg. IAS/Shunts: The atrial septum is grossly normal. Additional Comments: There is a small pleural effusion in the left lateral region.  LEFT VENTRICLE PLAX 2D LVIDd:         5.30 cm  Diastology LVIDs:         2.90 cm  LV e' lateral:   5.33 cm/s LV PW:         0.90 cm  LV E/e' lateral: 23.1 LV IVS:        1.10 cm  LV e' medial:    6.42 cm/s LVOT diam:     2.00 cm  LV E/e' medial:  19.2 LV SV:         82 LV SV Index:   48 LVOT Area:     3.14 cm  RIGHT VENTRICLE RV S prime:     9.25 cm/s TAPSE (M-mode): 1.6 cm LEFT ATRIUM             Index       RIGHT ATRIUM           Index LA diam:        4.40 cm 2.57 cm/m  RA Area:     15.20 cm LA Vol (A2C):   70.3 ml 41.04 ml/m RA Volume:   36.30 ml  21.19 ml/m LA Vol (A4C):   55.6 ml 32.46 ml/m LA Biplane Vol: 65.5 ml 38.24 ml/m  AORTIC VALVE LVOT Vmax:   130.00 cm/s LVOT Vmean:  87.300 cm/s LVOT VTI:    0.260 m  AORTA Ao Root diam: 3.60 cm Ao Asc diam:  3.60 cm MITRAL VALVE                TRICUSPID VALVE MV Area (PHT): 3.08 cm     TR Peak grad:   27.5 mmHg MV Decel Time: 246 msec     TR Vmax:        262.00 cm/s MV E velocity: 123.00 cm/s MV A velocity: 114.00 cm/s  SHUNTS MV E/A ratio:  1.08         Systemic VTI:  0.26 m                             Systemic Diam: 2.00 cm Eleonore Chiquito MD Electronically signed by Eleonore Chiquito MD Signature Date/Time: 04/17/2020/1:27:32 PM    Final         Scheduled Meds: . allopurinol  300 mg Oral Daily  . ascorbic acid  500 mg Oral Daily  . aspirin  81 mg Oral Daily  . brimonidine  1 drop Both Eyes BID  . budesonide (PULMICORT) nebulizer solution  0.5 mg Nebulization BID  . cholecalciferol  1,000 Units Oral Daily  . cycloSPORINE  1 drop Both Eyes BID  . docusate sodium  100 mg Oral BID  . dorzolamide  1 drop Both Eyes Daily  . enoxaparin (LOVENOX) injection  40 mg Subcutaneous  Q24H  . feeding supplement (ENSURE ENLIVE)  237 mL Oral BID BM  . feeding supplement (PRO-STAT SUGAR FREE 64)  30 mL Oral BID WC  . ferrous sulfate  325 mg Oral Q breakfast  . FLUoxetine  40 mg Oral Daily  . gabapentin  200 mg Oral TID  . latanoprost  1 drop Both Eyes QHS  . levalbuterol  1.25 mg Nebulization BID  . magnesium oxide  400 mg Oral BID  . methylPREDNISolone (SOLU-MEDROL) injection  40 mg Intravenous Q12H  . multivitamin with minerals  1 tablet Oral Daily  . mycophenolate  180 mg Oral BID  . pantoprazole  40 mg Oral BID  . sodium chloride flush  3 mL Intravenous Q12H  . tacrolimus  0.5 mg Oral QHS  . tacrolimus  1 mg Oral Daily  . valACYclovir  500 mg Oral BID  . verapamil  240 mg Oral Daily  . vitamin B-12  1,000 mcg Oral Daily   Continuous Infusions: . sodium chloride    . ceFEPime (MAXIPIME) IV       LOS: 2 days    time spent : 25 minutes   Jacelynn Hayton Arsenio Loader, MD [facility]  If 7PM-7AM, please contact night-coverage  04/18/2020, 2:28 PM

## 2020-04-18 NOTE — Progress Notes (Signed)
CRITICAL VALUE ALERT  Critical Value:  6.6 Hgb  Date & Time Notied:  04/18/20 721   Provider Notified: Reesa Chew MD  Orders Received/Actions taken: No new orders at this time. Will continue to monitor

## 2020-04-18 NOTE — Progress Notes (Signed)
Physical Therapy Treatment Patient Details Name: April [name] MRN: [id number] DOB: 1956-01-01 Today's Date: 04/18/2020    History of Present Illness 64 year old female with past medical history of polycystic kidney disease status post right kidney transplant, COPD, diastolic congestive heart failure and chronic hypoxic respiratory failure on 2 L of oxygen via nasal cannula, hypertension, anxiety disorder, obesity who presents to [facility] emergency department with complaints of shortness of breath nausea and vomiting. Pt found to have UTI and started on broad-spectrum antibiotics.    PT Comments    Pt progressing towards goals, however, very limited secondary to fatigue. Poor activity tolerance noted. Required use of stedy to get to chair this session. Mod A +2 to stand from lower surface height and min A to stand from stedy height. Current recommendations appropriate. Will continue to follow acutely to maximize functional mobility independence and safety.    Follow Up Recommendations  SNF     Equipment Recommendations  None recommended by PT    Recommendations for Other Services       Precautions / Restrictions Precautions Precautions: Fall Restrictions Weight Bearing Restrictions: No    Mobility  Bed Mobility Overal bed mobility: Needs Assistance Bed Mobility: Supine to Sit     Supine to sit: Mod assist     General bed mobility comments: modA to scoot to edge of bed, assistance for LE management  Transfers Overall transfer level: Needs assistance Equipment used: Ambulation equipment used Transfers: Sit to/from Stand Sit to Stand: Mod assist;+2 physical assistance         General transfer comment: mod A +2 to rise from bed into stedy. Min A +1 to stand once in stedy  Ambulation/Gait                 Stairs             Wheelchair Mobility    Modified Rankin (Stroke Patients Only)       Balance Overall balance assessment: Needs  assistance Sitting-balance support: Single extremity supported;Feet unsupported Sitting balance-Leahy Scale: Fair     Standing balance support: Bilateral upper extremity supported Standing balance-Leahy Scale: Poor Standing balance comment: reliant on external support                            Cognition Arousal/Alertness: Awake/alert Behavior During Therapy: WFL for tasks assessed/performed Overall Cognitive Status: Impaired/Different from baseline Area of Impairment: Problem solving                             Problem Solving: Slow processing;Decreased initiation General Comments: increased time and cues needed to engage in basic mobility tasks      Exercises      General Comments        Pertinent Vitals/Pain Pain Assessment: Faces Faces Pain Scale: Hurts little more Pain Location: hip and buttocks Pain Descriptors / Indicators: Grimacing Pain Intervention(s): Limited activity within patient's tolerance;Monitored during session;Repositioned    Home Living Family/patient expects to be discharged to:: Skilled nursing facility               Additional Comments: pt coming from rehab at [facility]    Prior Function Level of Independence: Needs assistance  Gait / Transfers Assistance Needed: pt working with PT at SNF, was able to ambulate 10' with RW and assistance a few days prior to admission, did have a fall at [facility]  and is anxious bout falling again. Was independent with RW prior to multiple hospital admissions ADL's / Homemaking Assistance Needed: pt requires assistance for lower body ADLs     PT Goals (current goals can now be found in the care plan section) Acute Rehab PT Goals Patient Stated Goal: to improve strength and eventually go back home PT Goal Formulation: With patient Time For Goal Achievement: 05/01/20 Potential to Achieve Goals: Fair Progress towards PT goals: Progressing toward goals    Frequency    Min  2X/week      PT Plan Current plan remains appropriate    Co-evaluation PT/OT/SLP Co-Evaluation/Treatment: Yes Reason for Co-Treatment: For patient/therapist safety;To address functional/ADL transfers PT goals addressed during session: Mobility/safety with mobility;Balance;Proper use of DME OT goals addressed during session: ADL's and self-care;Proper use of Adaptive equipment and DME      AM-PAC PT "6 Clicks" Mobility   Outcome Measure  Help needed turning from your back to your side while in a flat bed without using bedrails?: A Little Help needed moving from lying on your back to sitting on the side of a flat bed without using bedrails?: A Lot Help needed moving to and from a bed to a chair (including a wheelchair)?: Total Help needed standing up from a chair using your arms (e.g., wheelchair or bedside chair)?: A Lot Help needed to walk in hospital room?: Total Help needed climbing 3-5 steps with a railing? : Total 6 Click Score: 10    End of Session   Activity Tolerance: Patient limited by fatigue Patient left: in chair;with call bell/phone within reach Nurse Communication: Mobility status PT Visit Diagnosis: Other abnormalities of gait and mobility (R26.89);Repeated falls (R29.6);Muscle weakness (generalized) (M62.81)     Time: 9390-3009 PT Time Calculation (min) (ACUTE ONLY): 28 min  Charges:  $Therapeutic Activity: 8-22 mins                     Lou Miner, DPT  Acute Rehabilitation Services  Pager: [phone] Office: [phone]    [name] 04/18/2020, 6:21 PM

## 2020-04-18 NOTE — Progress Notes (Signed)
Pharmacy Antibiotic Note  April [name] is a 64 y.o. female admitted on 04/15/2020 with SOB and nausea/vomiting.  Pharmacy has been consulted for Cefepime dosing for sepsis/UTI/COPD exacerbation.  Hx renal transplant 2013, on immunosuppressants.    Day # 4 antibiotics, urine culture grew Klebsiella species, sensitivities pending.  Creatinine has trended up, IV Lasix stopped this morning.  Plan:  Cefepime 2gm IV q8hrs adjusted to q12h for current renal function.  Will follow renal function, final culture data, clinical progress and antibiotic plans.  Height: 5\' 1"  (154.9 cm) Weight: 71.5 kg (157 lb 10.1 oz) IBW/kg (Calculated) : 47.8  Temp (24hrs), Avg:97.9 F (36.6 C), Min:97.5 F (36.4 C), Max:98.4 F (36.9 C)  Recent Labs  Lab 04/15/20 2005 04/15/20 2254 04/16/20 0608 04/17/20 0355 04/18/20 0605  WBC 22.9*  --  24.9* 12.5* 8.3  CREATININE 0.77  --  0.86 1.02* 1.15*  LATICACIDVEN 1.1 1.0  --   --   --     Estimated Creatinine Clearance: 45.9 mL/min (A) (by C-G formula based on SCr of 1.15 mg/dL (H)).    Allergies  Allergen Reactions  . Infed [Iron Dextran] Other (See Comments)    Chest tightness  . Pentamidine Itching, Shortness Of Breath and Swelling  . Erythromycin [Erythromycin] Other (See Comments)    Mouth Ulcers  . Iohexol Other (See Comments)    Per patient "she has had a kidney transplant and should never have contrast"  . Oxycodone Nausea Only and Nausea And Vomiting  . Erythromycin Rash    Causes breakout in mouth  . Ultram [Tramadol Hcl] Anxiety    Antimicrobials this admission: Cefepime 5/24 >> Vancomycin x 1 on 5/24  Metronidazole x 1 on 5/24  Dose adjustments this admission:  5/27:  Cefepime q8h > q12h for creatinine trended up  Microbiology results: 5/24 Blood x 2: ng x 2 days to date 5/24 urine: Klebsiella ornithinolytica - sensitivities pending 5/24 COVID: negative  Thank you for allowing pharmacy to be a part of this patient's  care.  Arty Baumgartner, [place] Phone: [phone] 04/18/2020 8:36 AM

## 2020-04-19 LAB — CBC
HCT: 27.1 % — ABNORMAL LOW (ref 36.0–46.0)
Hemoglobin: 8.1 g/dL — ABNORMAL LOW (ref 12.0–15.0)
MCH: 29.6 pg (ref 26.0–34.0)
MCHC: 29.9 g/dL — ABNORMAL LOW (ref 30.0–36.0)
MCV: 98.9 fL (ref 80.0–100.0)
Platelets: 151 10*3/uL (ref 150–400)
RBC: 2.74 MIL/uL — ABNORMAL LOW (ref 3.87–5.11)
RDW: 22.2 % — ABNORMAL HIGH (ref 11.5–15.5)
WBC: 5.2 10*3/uL (ref 4.0–10.5)
nRBC: 0 % (ref 0.0–0.2)

## 2020-04-19 LAB — TYPE AND SCREEN
ABO/RH(D): A NEG
Antibody Screen: NEGATIVE
Unit division: 0

## 2020-04-19 LAB — MAGNESIUM: Magnesium: 2.3 mg/dL (ref 1.7–2.4)

## 2020-04-19 LAB — BASIC METABOLIC PANEL
Anion gap: 12 (ref 5–15)
BUN: 78 mg/dL — ABNORMAL HIGH (ref 8–23)
CO2: 23 mmol/L (ref 22–32)
Calcium: 9.8 mg/dL (ref 8.9–10.3)
Chloride: 99 mmol/L (ref 98–111)
Creatinine, Ser: 1.26 mg/dL — ABNORMAL HIGH (ref 0.44–1.00)
GFR calc Af Amer: 53 mL/min — ABNORMAL LOW (ref >60)
GFR calc non Af Amer: 46 mL/min — ABNORMAL LOW (ref >60)
Glucose, Bld: 282 mg/dL — ABNORMAL HIGH (ref 70–99)
Potassium: 4 mmol/L (ref 3.5–5.1)
Sodium: 134 mmol/L — ABNORMAL LOW (ref 135–145)

## 2020-04-19 LAB — BPAM RBC
Blood Product Expiration Date: 202106102359
ISSUE DATE / TIME: 202105270954
Unit Type and Rh: 600

## 2020-04-19 MED ORDER — PREDNISONE 10 MG PO TABS
40.0000 mg | ORAL_TABLET | Freq: Every day | ORAL | 0 refills | Status: AC
Start: 2020-04-19 — End: 2020-04-22

## 2020-04-19 MED ORDER — BUDESONIDE 0.5 MG/2ML IN SUSP: 0.5000 mg | Freq: Two times a day (BID) | RESPIRATORY_TRACT | 12 refills | Status: DC

## 2020-04-19 MED ORDER — ALUM & MAG HYDROXIDE-SIMETH 200-200-20 MG/5ML PO SUSP
30.0000 mL | ORAL | Status: DC | PRN
  Administered 2020-04-19: 30 mL via ORAL
  Filled 2020-04-19: qty 30

## 2020-04-19 MED ORDER — DM-GUAIFENESIN ER 30-600 MG PO TB12: 1.0000 | ORAL_TABLET | Freq: Two times a day (BID) | ORAL | Status: DC | PRN

## 2020-04-19 MED ORDER — NITROFURANTOIN MONOHYD MACRO 100 MG PO CAPS: 100.0000 mg | ORAL_CAPSULE | Freq: Two times a day (BID) | ORAL | 0 refills | Status: AC

## 2020-04-19 NOTE — Progress Notes (Signed)
Patient discharged to [facility] via [place]. IV removed by dayshift RN Rudene Anda. Patient belongings sent with patient and patient's daughter.

## 2020-04-19 NOTE — Care Management Important Message (Signed)
Important Message  Patient Details  Name: April [name] MRN: [id number] Date of Birth: 24-Jan-1956   Medicare Important Message Given:  Yes - Important Message mailed due to current National Emergency  Verbal consent obtained due to current National Emergency  Relationship to patient: Self Contact Name: Lailee Hoelzel Call Date: 04/19/20  Time: 1106 Phone: [phone] Outcome: Spoke with contact Important Message mailed to: Other (must enter comment)(per patiet no additional copy of IM needed)    Elleigh Cassetta P Tata Timmins 04/19/2020, 11:07 AM

## 2020-04-19 NOTE — TOC Transition Note (Signed)
Transition of Care [facility]) - CM/SW Discharge Note   Patient Details  Name: April [name] MRN: [id number] Date of Birth: 03-Jul-1956  [facility] CM/SW Contact:  Benard Halsted, LCSW Phone Number: 04/19/2020, 2:19 PM   Clinical Narrative:    Patient will DC to: [place] Anticipated DC date: 04/19/20 Family notified: Pt declined and will notify daughter Transport by: [name]   Per MD patient ready for DC to [place]. RN, patient, patient's family, and facility notified of DC. Discharge Summary and FL2 sent to facility. RN to call report prior to discharge [phone]). DC packet on chart. Ambulance transport requested for patient.   CSW will sign off for now as social work intervention is no longer needed. Please consult [place] again if new needs arise.      Final next level of care: [place] Barriers to Discharge: No Barriers Identified   Patient Goals and CMS Choice Patient states their goals for this hospitalization and ongoing recovery are:: Return to [facility].gov Compare Post Acute Care list provided to:: Patient Choice offered to / list presented to : Patient  Discharge Placement   Existing PASRR number confirmed : 04/19/20          Patient chooses bed at: [facility] Patient to be transferred to facility by: [place] Name of family member notified: Pt declined, will contact her daugher Patient and family notified of of transfer: 04/19/20  Discharge Plan and Services In-house Referral: Clinical Social Work   Post Acute Care Choice: [place]                               Social Determinants of Health (SDOH) Interventions     Readmission Risk Interventions Readmission Risk Prevention Plan 04/19/2020 02/09/2020 04/20/2019  Transportation Screening Complete Complete Complete  PCP or Specialist Appt within 3-5 Days - - Not Complete  Not Complete comments - - not yet ready for d/c  HRI or [place] - -  Not Complete  HRI or Home Care Consult comments - - no needs identified  Social Work Scientific laboratory technician for [place] Planning/Counseling - - Not Complete  SW consult not completed comments - - no needs identified  Palliative Care Screening - - Not Applicable  Medication Review (RN Care Manager) Complete Referral to Pharmacy Complete  PCP or Specialist appointment within 3-5 days of discharge Complete Complete -  [place] or Home Care Consult Complete Complete -  SW Recovery Care/Counseling Consult Complete - -  Palliative Care Screening Not Applicable Complete -  [place] Complete Patient Refused -  Some recent data might be hidden

## 2020-04-19 NOTE — Progress Notes (Signed)
Report called and given to [facility]

## 2020-04-19 NOTE — Discharge Summary (Signed)
Physician Discharge Summary  JENISSE VULLO DJM:[id number] DOB: Jun 23, 1956 DOA: 04/15/2020  PCP: [name], MD  Admit date: 04/15/2020 Discharge date: 04/19/2020  Admitted From: Home Disposition: SNF  Recommendations for Outpatient Follow-up:  1. Follow up with PCP in 1-2 weeks 2. Please obtain BMP/CBC in one week your next doctors visit.  3. Nitrofurantoin 100 mg twice daily X 1 day 4. Oral prednisone 40 mg daily for 3 more days thereafter transition to daily oral immunosuppressive prednisone dose for renal transplant.   Discharge Condition: Stable CODE STATUS: Full code Diet recommendation: Heart healthy/renal  Brief/Interim Summary: 64 year old female with past medical history of polycystic kidney disease status post right kidney transplant, COPD, diastolic congestive heart failure and chronic hypoxic respiratory failure on 2 L of oxygen via nasal cannula, hypertension, anxiety disorder, obesity who presents to [facility] emergency department with complaints of shortness of breath nausea and vomiting.  Recurrent admissions for COPD exacerbation.  She was found to be febrile with suggestion of urinary tract infection.  Started on broad-spectrum antibiotics.  Urine cultures grew Klebsiella.  She tolerated IV cefepime well, will be discharged on 1 more day of oral Bactrim to complete 5-day course.  She should follow-up with outpatient nephrology.  Prednisone taper as mentioned above.    Assessment & Plan:   Principal Problem:   Sepsis due to gram-negative UTI ([place]) Active Problems:   GERD without esophagitis   Renal transplant recipient   Acute on chronic diastolic CHF (congestive heart failure) (HCC)   Anemia of chronic disease   COPD exacerbation (HCC)   Pericardial effusion   Chronic respiratory failure with hypoxia (HCC)   Proteinuria   Acute respiratory distress   Sepsis secondary to urinary tract infection -Urine cultures growing Klebsiella,  sensitivities  -  Cefepime.  1 more day of oral Macrobid.  This will complete 5-day course -Supportive care.  Acute on chronic hypoxic respiratory failure, 2 L nasal cannula at baseline Acute COPD exacerbation -Significantly improved now on 2 L nasal cannula -Transition to p.o. prednisone 40 mg for 2 more days thereafter back to 7.5 mg orally daily  Acute diastolic congestive heart failure, echo showed an EF of 55 to 60%, class II -Resume home Lasix. -No pericardial effusion seen on the echocardiogram  GERD without esophagitis-continue PPI  Anemia of chronic disease, macrocytic S/p 1U PRBC Hb now stable 8.1. No sign of bleeding.  Per patient she is due for outpatient colonoscopy.  Follows with Dr Collene Mares  History of renal transplant; Cr Ranges from 0.8-1.2 -On tacrolimus, mycophenolate.  Continue prednisone.  Outpatient lab work BMP in 1 week.  History of SVT -Verapamil    Discharge Diagnoses:  Principal Problem:   Sepsis due to gram-negative UTI ([place]) Active Problems:   GERD without esophagitis   Renal transplant recipient   Acute on chronic diastolic CHF (congestive heart failure) (HCC)   Anemia of chronic disease   COPD exacerbation (HCC)   Pericardial effusion   Chronic respiratory failure with hypoxia (HCC)   Proteinuria   Acute respiratory distress    Consultations: None  Subjective: Feels ok, no complaints.   Discharge Exam: Vitals:   04/19/20 0853 04/19/20 1236  BP:  (!) 149/77  Pulse: 77 81  Resp: 16 20  Temp:  98.3 F (36.8 C)  SpO2: 100% 100%   Vitals:   04/19/20 0324 04/19/20 0745 04/19/20 0853 04/19/20 1236  BP: 126/70 (!) 158/75  (!) 149/77  Pulse: 63 74 77 81  Resp: 13  20 16 20   Temp: 98.3 F (36.8 C) 97.9 F (36.6 C)  98.3 F (36.8 C)  TempSrc: Oral Oral  Oral  SpO2: 100% 100% 100% 100%  Weight: 73.2 kg     Height:        General: Pt is alert, awake, not in acute distress; 2L [place] Cardiovascular: RRR, S1/S2 +, no rubs, no  gallops Respiratory: CTA bilaterally, no wheezing, no rhonchi Abdominal: Soft, NT, ND, bowel sounds + Extremities: no edema, no cyanosis  Discharge Instructions   Allergies as of 04/19/2020      Reactions   Infed [iron Dextran] Other (See Comments)   Chest tightness   Pentamidine Itching, Shortness Of Breath, Swelling   Erythromycin [erythromycin] Other (See Comments)   Mouth Ulcers   Iohexol Other (See Comments)   Per patient "she has had a kidney transplant and should never have contrast"   Oxycodone Nausea Only, Nausea And Vomiting   Erythromycin Rash   Causes breakout in mouth   Ultram [tramadol Hcl] Anxiety      Medication List    STOP taking these medications   methocarbamol 500 MG tablet Commonly known as: ROBAXIN     TAKE these medications   albuterol 108 (90 Base) MCG/ACT inhaler Commonly known as: VENTOLIN HFA Inhale 2 puffs into the lungs every 4 (four) hours as needed for wheezing or shortness of breath.   allopurinol 300 MG tablet Commonly known as: ZYLOPRIM Take 1 tablet (300 mg total) by mouth daily.   Aplisol 5 UNIT/0.1ML injection Generic drug: tuberculin Inject 5 Units into the skin once.   ascorbic acid 500 MG tablet Commonly known as: VITAMIN C Take 500 mg by mouth daily.   aspirin 81 MG chewable tablet Chew 81 mg by mouth daily.   brimonidine 0.1 % Soln Commonly known as: ALPHAGAN P Place 1 drop into both eyes 2 (two) times daily.   budesonide 0.5 MG/2ML nebulizer solution Commonly known as: PULMICORT Take 2 mLs (0.5 mg total) by nebulization 2 (two) times daily.   cholecalciferol 25 MCG (1000 UNIT) tablet Commonly known as: VITAMIN D3 Take 1 tablet (1,000 Units total) by mouth daily.   cycloSPORINE 0.05 % ophthalmic emulsion Commonly known as: RESTASIS Place 1 drop into both eyes 2 (two) times daily.   dextromethorphan-guaiFENesin 30-600 MG 12hr tablet Commonly known as: MUCINEX DM Take 1 tablet by mouth 2 (two) times daily as  needed for cough.   docusate sodium 100 MG capsule Commonly known as: COLACE Take 1 capsule (100 mg total) by mouth 2 (two) times daily.   dorzolamide 2 % ophthalmic solution Commonly known as: TRUSOPT Place 1 drop into both eyes daily.   protein supplement Liqd Take 30 mLs by mouth in the morning and at bedtime.   feeding supplement (ENSURE ENLIVE) Liqd Take 237 mLs by mouth 2 (two) times daily between meals.   feeding supplement (PRO-STAT SUGAR FREE 64) Liqd Take 30 mLs by mouth 2 (two) times daily with a meal.   ferrous sulfate 325 (65 FE) MG tablet Take 325 mg by mouth daily with breakfast.   FLUoxetine 40 MG capsule Commonly known as: PROZAC Take 1 capsule (40 mg total) by mouth daily.   furosemide 40 MG tablet Commonly known as: LASIX Take 1 tablet (40 mg total) by mouth daily.   gabapentin 100 MG capsule Commonly known as: NEURONTIN Take 200 mg by mouth 3 (three) times daily.   guaiFENesin 600 MG 12 hr tablet Commonly known as: MUCINEX Take  1 tablet (600 mg total) by mouth 2 (two) times daily.   hydrOXYzine 10 MG tablet Commonly known as: ATARAX/VISTARIL Take 10 mg by mouth 2 (two) times daily as needed for anxiety.   ipratropium-albuterol 0.5-2.5 (3) MG/3ML Soln Commonly known as: DUONEB Take 3 mLs by nebulization 2 (two) times daily.   magnesium oxide 400 MG tablet Commonly known as: MAG-OX Take 1 tablet (400 mg total) by mouth 2 (two) times daily.   multivitamin with minerals Tabs tablet Take 1 tablet by mouth daily.   mycophenolate 180 MG EC tablet Commonly known as: MYFORTIC Take 180 mg by mouth 2 (two) times daily.   nitrofurantoin (macrocrystal-monohydrate) 100 MG capsule Commonly known as: Macrobid Take 1 capsule (100 mg total) by mouth 2 (two) times daily for 1 day.   pantoprazole 40 MG tablet Commonly known as: PROTONIX Take 1 tablet (40 mg total) by mouth 2 (two) times daily.   predniSONE 5 MG tablet Commonly known as:  DELTASONE Take 1.5 tablets (7.5 mg total) by mouth daily with breakfast. What changed: Another medication with the same name was added. Make sure you understand how and when to take each.   predniSONE 10 MG tablet Commonly known as: DELTASONE Take 4 tablets (40 mg total) by mouth daily for 3 days. What changed: You were already taking a medication with the same name, and this prescription was added. Make sure you understand how and when to take each.   Symbicort 160-4.5 MCG/ACT inhaler Generic drug: budesonide-formoterol Inhale 2 puffs into the lungs 2 (two) times daily.   tacrolimus 0.5 MG capsule Commonly known as: PROGRAF Take 0.5 mg by mouth at bedtime. Takes brand name Prograf   Prograf 1 MG capsule Generic drug: tacrolimus Take 1 mg by mouth daily.   Travatan Z 0.004 % Soln ophthalmic solution Generic drug: Travoprost (BAK Free) Place 1 drop into both eyes at bedtime.   valACYclovir 500 MG tablet Commonly known as: VALTREX Take 500 mg by mouth 2 (two) times daily.   verapamil 240 MG CR tablet Commonly known as: CALAN-SR Take 1 tablet (240 mg total) by mouth daily.   vitamin B-12 100 MCG tablet Commonly known as: CYANOCOBALAMIN Take 1,000 mcg by mouth daily.       Contact information for follow-up providers    [name], MD. Schedule an appointment as soon as possible for a visit in 1 week(s).   Specialty: Family Medicine Contact information: [place] [address] [phone]            Contact information for after-discharge care    Destination    [facility] .   Service: Skilled Nursing Contact information: [place] [address] [phone]                 Allergies  Allergen Reactions  . Infed [Iron Dextran] Other (See Comments)    Chest tightness  . Pentamidine Itching, Shortness Of Breath and Swelling  . Erythromycin [Erythromycin] Other (See Comments)    Mouth  Ulcers  . Iohexol Other (See Comments)    Per patient "she has had a kidney transplant and should never have contrast"  . Oxycodone Nausea Only and Nausea And Vomiting  . Erythromycin Rash    Causes breakout in mouth  . Ultram [Tramadol Hcl] Anxiety    You were cared for by a hospitalist during your hospital stay. If you have any questions about your discharge medications or the care you  received while you were in the hospital after you are discharged, you can call the unit and asked to speak with the hospitalist on call if the hospitalist that took care of you is not available. Once you are discharged, your primary care physician will handle any further medical issues. Please note that no refills for any discharge medications will be authorized once you are discharged, as it is imperative that you return to your primary care physician (or establish a relationship with a primary care physician if you do not have one) for your aftercare needs so that they can reassess your need for medications and monitor your lab values.   Procedures/Studies: CT ABDOMEN PELVIS WO CONTRAST  Result Date: 04/15/2020 CLINICAL DATA:  Worsening shortness of breath. Increased nausea and vomiting. Fever. Patient reports with recent fall with left-sided rib pain. EXAM: CT CHEST, ABDOMEN AND PELVIS WITHOUT CONTRAST TECHNIQUE: Multidetector CT imaging of the chest, abdomen and pelvis was performed following the standard protocol without IV contrast. COMPARISON:  Radiograph earlier this day. Most recent chest CT 07/27/2014, most recent abdominal CT 03/31/2019 FINDINGS: CT CHEST FINDINGS Cardiovascular: No periaortic stranding to suggest injury. Dilated main pulmonary artery at 4.1 cm. Small circumferential pericardial effusion. Multi chamber cardiomegaly. There are prominent venous collaterals anterior to the left axilla and in the included left upper extremity. Decreased density of the blood pool suggesting anemia.  Mediastinum/Nodes: No mediastinal hematoma. No pneumomediastinum. Few scattered mediastinal lymph nodes that are not enlarged by size criteria. Limited assessment for hilar adenopathy in the absence of IV contrast. No esophageal wall thickening. Subcentimeter nodule in the left lobe of the thyroid gland, does not need further evaluation based on size. Lungs/Pleura: Small bilateral pleural effusions. Compressive atelectasis most prominent in the lower lobes. Additional subsegmental atelectasis in the lingula and left upper lobe. No pneumothorax. No evidence of pulmonary contusion. No pulmonary mass. There is mild central bronchial thickening. Musculoskeletal: Remote left anterior first and second rib fractures. Remote anterior eighth, ninth, and tenth rib fractures with callus. Remote posterior tenth rib fracture. No evidence of acute rib fracture. Remote fractures of right anterior/lateral second through ninth ribs. No acute right rib fracture. No sternal fracture. No fracture of the thoracic spine, included clavicles or shoulder girdles. CT ABDOMEN PELVIS FINDINGS Hepatobiliary: Multiple hepatic cysts. No evidence of a Paddock injury or perihepatic hematoma. Layering hyperdensity in the gallbladder is likely sludge. No pericholecystic inflammation. No biliary dilatation. Pancreas: Pancreas is unremarkable. No peripancreatic inflammation. No evidence of pancreatic injury or pancreatic cyst. Spleen: Splenomegaly with spleen spanning 15.4 cm cranial caudal. No perisplenic hematoma or focal abnormality. Adrenals/Urinary Tract: No adrenal nodule. Polycystic kidneys which are enlarged with innumerable cysts of varying sizes. 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 There is no transplant hydronephrosis. No evidence of renal injury. Urinary bladder is only minimally distended, partially obscured by streak artifact from left hip arthroplasty. No obvious focal bladder abnormality. Stomach/Bowel: Sigmoid colonic  diverticulosis without diverticulitis. Mild colonic tortuosity. Small volume of colonic stool. Appendix is normal. Stomach and small bowel are unremarkable. No obstruction, inflammatory change, or evidence of injury. Vascular/Lymphatic: Aorto bi-iliac atherosclerosis. No aortic aneurysm. No retroperitoneal fluid. No adenopathy. Reproductive: Uterus partially obscured by streak artifact from left hip arthroplasty. No suspicious adnexal mass. Other: Small amount of fluid in the left inguinal canal is unchanged from 12/20/2019 CT. No ascites or free fluid in the abdomen. No free air. There is a small fat containing umbilical hernia. Musculoskeletal: Left hip arthroplasty. The bones are  under mineralized. Remote left superior and inferior pubic rami fractures. Remote bilateral sacral insufficiency fractures. No evidence of acute fracture. IMPRESSION: 1. No evidence of acute traumatic injury in the chest, abdomen, or pelvis. 2. Small bilateral pleural effusions with compressive atelectasis. 3. Multi chamber cardiomegaly. Small circumferential pericardial effusion. Dilated main pulmonary artery suggesting pulmonary arterial hypertension. 4. No acute findings in the abdomen/pelvis. 5. Polycystic kidneys and liver. Transplant kidney in the right iliac fossa without transplant hydronephrosis. 6. Splenomegaly. 7. Colonic diverticulosis without diverticulitis. 8. Remote bilateral rib fractures.  No acute rib fracture. Aortic Atherosclerosis (ICD10-I70.0). Electronically Signed   By: Keith Rake M.D.   On: 04/15/2020 21:39   DG Chest 1 View  Result Date: 04/16/2020 CLINICAL DATA:  Shortness of breath EXAM: CHEST  1 VIEW COMPARISON:  CT chest 04/15/2020 FINDINGS: Bilateral effusions are less well visualized on today's examination. There is interstitial opacity throughout the lungs with a perihilar and basilar predominance with some fissural and septal thickening as well. The heart is enlarged. Pulmonary vascularity is  cephalized indistinct with prominence of the central pulmonary arteries is again noted, better seen on comparison. No acute osseous or soft tissue abnormality. Telemetry leads overlie the chest. IMPRESSION: Findings highly suspicious for congestive heart failure with cardiomegaly, vascular congestion and bilateral effusions with interstitial opacities suggesting some developing interstitial edema. Electronically Signed   By: [name] M.D.   On: 04/16/2020 01:43   DG Chest 2 View  Result Date: 04/07/2020 CLINICAL DATA:  Chest pain, lethargy. EXAM: CHEST - 2 VIEW COMPARISON:  Chest x-rays dated 03/14/2020 and 02/29/2020. FINDINGS: Stable mild cardiomegaly. No confluent opacity to suggest consolidating pneumonia. No pleural effusion or pneumothorax is seen. Osseous structures about the chest are unremarkable. IMPRESSION: No active cardiopulmonary disease. No evidence of pneumonia or pulmonary edema. Electronically Signed   By: Franki Cabot M.D.   On: 04/07/2020 05:28   CT Head Wo Contrast  Result Date: 04/15/2020 CLINICAL DATA:  Status post trauma. EXAM: CT HEAD WITHOUT CONTRAST TECHNIQUE: Contiguous axial images were obtained from the base of the skull through the vertex without intravenous contrast. COMPARISON:  Apr 19, 2019 FINDINGS: Brain: No evidence of acute infarction, hemorrhage, hydrocephalus, extra-axial collection or mass lesion/mass effect. Vascular: No hyperdense vessel or unexpected calcification. Skull: Normal. Negative for fracture or focal lesion. Sinuses/Orbits: No acute finding. Other: None. IMPRESSION: No acute intracranial pathology. Electronically Signed   By: Virgina Norfolk M.D.   On: 04/15/2020 21:44   CT Chest Wo Contrast  Result Date: 04/15/2020 CLINICAL DATA:  Worsening shortness of breath. Increased nausea and vomiting. Fever. Patient reports with recent fall with left-sided rib pain. EXAM: CT CHEST, ABDOMEN AND PELVIS WITHOUT CONTRAST TECHNIQUE: Multidetector CT imaging  of the chest, abdomen and pelvis was performed following the standard protocol without IV contrast. COMPARISON:  Radiograph earlier this day. Most recent chest CT 07/27/2014, most recent abdominal CT 03/31/2019 FINDINGS: CT CHEST FINDINGS Cardiovascular: No periaortic stranding to suggest injury. Dilated main pulmonary artery at 4.1 cm. Small circumferential pericardial effusion. Multi chamber cardiomegaly. There are prominent venous collaterals anterior to the left axilla and in the included left upper extremity. Decreased density of the blood pool suggesting anemia. Mediastinum/Nodes: No mediastinal hematoma. No pneumomediastinum. Few scattered mediastinal lymph nodes that are not enlarged by size criteria. Limited assessment for hilar adenopathy in the absence of IV contrast. No esophageal wall thickening. Subcentimeter nodule in the left lobe of the thyroid gland, does not need further evaluation based on size. Lungs/Pleura:  Small bilateral pleural effusions. Compressive atelectasis most prominent in the lower lobes. Additional subsegmental atelectasis in the lingula and left upper lobe. No pneumothorax. No evidence of pulmonary contusion. No pulmonary mass. There is mild central bronchial thickening. Musculoskeletal: Remote left anterior first and second rib fractures. Remote anterior eighth, ninth, and tenth rib fractures with callus. Remote posterior tenth rib fracture. No evidence of acute rib fracture. Remote fractures of right anterior/lateral second through ninth ribs. No acute right rib fracture. No sternal fracture. No fracture of the thoracic spine, included clavicles or shoulder girdles. CT ABDOMEN PELVIS FINDINGS Hepatobiliary: Multiple hepatic cysts. No evidence of a Paddock injury or perihepatic hematoma. Layering hyperdensity in the gallbladder is likely sludge. No pericholecystic inflammation. No biliary dilatation. Pancreas: Pancreas is unremarkable. No peripancreatic inflammation. No evidence  of pancreatic injury or pancreatic cyst. Spleen: Splenomegaly with spleen spanning 15.4 cm cranial caudal. No perisplenic hematoma or focal abnormality. Adrenals/Urinary Tract: No adrenal nodule. Polycystic kidneys which are enlarged with innumerable cysts of varying sizes. 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 There is no transplant hydronephrosis. No evidence of renal injury. Urinary bladder is only minimally distended, partially obscured by streak artifact from left hip arthroplasty. No obvious focal bladder abnormality. Stomach/Bowel: Sigmoid colonic diverticulosis without diverticulitis. Mild colonic tortuosity. Small volume of colonic stool. Appendix is normal. Stomach and small bowel are unremarkable. No obstruction, inflammatory change, or evidence of injury. Vascular/Lymphatic: Aorto bi-iliac atherosclerosis. No aortic aneurysm. No retroperitoneal fluid. No adenopathy. Reproductive: Uterus partially obscured by streak artifact from left hip arthroplasty. No suspicious adnexal mass. Other: Small amount of fluid in the left inguinal canal is unchanged from 12/20/2019 CT. No ascites or free fluid in the abdomen. No free air. There is a small fat containing umbilical hernia. Musculoskeletal: Left hip arthroplasty. The bones are under mineralized. Remote left superior and inferior pubic rami fractures. Remote bilateral sacral insufficiency fractures. No evidence of acute fracture. IMPRESSION: 1. No evidence of acute traumatic injury in the chest, abdomen, or pelvis. 2. Small bilateral pleural effusions with compressive atelectasis. 3. Multi chamber cardiomegaly. Small circumferential pericardial effusion. Dilated main pulmonary artery suggesting pulmonary arterial hypertension. 4. No acute findings in the abdomen/pelvis. 5. Polycystic kidneys and liver. Transplant kidney in the right iliac fossa without transplant hydronephrosis. 6. Splenomegaly. 7. Colonic diverticulosis without diverticulitis. 8. Remote  bilateral rib fractures.  No acute rib fracture. Aortic Atherosclerosis (ICD10-I70.0). Electronically Signed   By: Keith Rake M.D.   On: 04/15/2020 21:39   CT Cervical Spine Wo Contrast  Result Date: 04/15/2020 CLINICAL DATA:  Status post trauma. EXAM: CT CERVICAL SPINE WITHOUT CONTRAST TECHNIQUE: Multidetector CT imaging of the cervical spine was performed without intravenous contrast. Multiplanar CT image reconstructions were also generated. COMPARISON:  None. FINDINGS: Alignment: Approximately 1 mm to 2 mm anterolisthesis of the C4 vertebral body is seen on C5. Skull base and vertebrae: No acute fracture. No primary bone lesion or focal pathologic process. Soft tissues and spinal canal: No prevertebral fluid or swelling. No visible canal hematoma. Disc levels: Mild endplate sclerosis is seen at the level of C5-C6. Moderate severity intervertebral disc space narrowing is also seen at this level. Upper chest: Negative. Other: None. IMPRESSION: 1. No acute osseous abnormality. 2. Approximately 1 mm to 2 mm anterolisthesis of the C4 vertebral body on C5. 3. Moderate severity degenerative changes at the level of C5-C6. Electronically Signed   By: Virgina Norfolk M.D.   On: 04/15/2020 21:26   CT L-SPINE NO CHARGE  Result  Date: 04/15/2020 CLINICAL DATA:  Back pain. Recent fall. EXAM: CT LUMBAR SPINE WITHOUT CONTRAST TECHNIQUE: Multidetector CT imaging of the lumbar spine was performed without intravenous contrast administration. Multiplanar CT image reconstructions were also generated. COMPARISON:  Lumbar spine radiographs 12/19/2019. CT pelvis 12/20/2019. FINDINGS: Segmentation: 5 lumbar type vertebrae. Alignment: Normal. Vertebrae: Chronic bilateral sacral fractures with sclerosis and angular deformity of the sacrum anteriorly at the S2 level. Chronic bilateral L5 transverse process fractures. No acute lumbar spine fracture. Small Schmorl's nodes at T11-12 and T12-L1. Paraspinal and other soft tissues:  No acute paraspinal soft tissue abnormality identified. Intra-abdominal and pelvic contents reported separately. Disc levels: Intervertebral disc space heights are preserved in the lumbar spine. Disc bulging and severe facet hypertrophy at L4-5 result in mild right greater than left neural foraminal stenosis without evidence of significant spinal stenosis. There is mild disc bulging at L3-4 with minimal to mild right neural foraminal stenosis. IMPRESSION: 1. No acute osseous abnormality identified in the lumbar spine. 2. Chronic bilateral sacral fractures and chronic bilateral L5 transverse process fractures. 3. Severe L4-5 facet hypertrophy with mild right greater than left neural foraminal stenosis. Electronically Signed   By: Logan Bores M.D.   On: 04/15/2020 21:32   DG Chest Port 1 View  Result Date: 04/16/2020 CLINICAL DATA:  Shortness of breath EXAM: PORTABLE CHEST 1 VIEW COMPARISON:  Apr 16, 2020 study obtained earlier in the day FINDINGS: Heart is mildly enlarged with mild pulmonary venous hypertension. There is slight interstitial edema. No appreciable consolidation or pleural effusion. There is mild atelectatic change in the lower lung regions bilaterally. No adenopathy. No bone lesions. IMPRESSION: Mild cardiomegaly with a degree of pulmonary vascular congestion. Slight interstitial edema. There may be a degree of congestive heart failure. Suspect somewhat less edema compared to earlier in the day. No new opacity. No consolidation. Mild atelectatic change in each lower lung region. Electronically Signed   By: Lowella Grip III M.D.   On: 04/16/2020 08:46   DG Chest Portable 1 View  Result Date: 04/15/2020 CLINICAL DATA:  64 year old female with history of trauma from a fall complaining of left-sided rib pain. EXAM: PORTABLE CHEST 1 VIEW COMPARISON:  Chest x-ray 04/07/2020. FINDINGS: Lung volumes are low. Linear opacity in the left mid lung, new compared to the recent prior study, presumably a  focus of subsegmental atelectasis. No acute consolidative airspace disease. No pleural effusions. Cephalization of the pulmonary vasculature, without frank pulmonary edema. Heart size is mildly enlarged. Upper mediastinal contours are within normal limits. An old healed left-sided rib fractures are noted. No definite acute displaced rib fractures are identified. IMPRESSION: 1. Low lung volumes with new area of subsegmental atelectasis in the left mid lung. 2. Cardiomegaly with pulmonary venous congestion, but no frank pulmonary edema. 3. Multiple old healed left-sided rib fractures, similar to prior chest CT 07/27/2014. Electronically Signed   By: Vinnie Langton M.D.   On: 04/15/2020 19:23   ECHOCARDIOGRAM COMPLETE  Result Date: 04/17/2020    ECHOCARDIOGRAM REPORT   Patient Name:   April [name] Dimperio Date of Exam: 04/17/2020 Medical Rec #:  [id number]         Height:       61.0 in Accession #:    [id number]        Weight:       159.0 lb Date of Birth:  [birth year]         BSA:          1.713 m Patient Age:  63 years          BP:           138/74 mmHg Patient Gender: F                 HR:           85 bpm. Exam Location:  Inpatient Procedure: 2D Echo, Color Doppler and Cardiac Doppler Indications:    Y18.56 Acute diastolic (congestive) heart failure  History:        Patient has prior history of Echocardiogram examinations, most                 recent 04/21/2019. CHF, COPD; Risk Factors:Dyslipidemia.  Sonographer:    Raquel Sarna Senior RDCS Referring Phys: [id number] Sherryll Burger [facility]  Sonographer Comments: Supine on CPAP. IMPRESSIONS  1. Left ventricular ejection fraction, by estimation, is 55 to 60%. The left ventricle has normal function. The left ventricle has no regional wall motion abnormalities. Left ventricular diastolic parameters are consistent with Grade II diastolic dysfunction (pseudonormalization). Elevated left atrial pressure.  2. Right ventricular systolic function is normal. The right ventricular size is  normal. There is mildly elevated pulmonary artery systolic pressure. The estimated right ventricular systolic pressure is 63.7 mmHg.  3. Left atrial size was mildly dilated.  4. The mitral valve is degenerative. Trivial mitral valve regurgitation. No evidence of mitral stenosis.  5. The aortic valve is tricuspid. Aortic valve regurgitation is not visualized. No aortic stenosis is present.  6. The inferior vena cava is dilated in size with <50% respiratory variability, suggesting right atrial pressure of 15 mmHg. Comparison(s): No significant change from prior study. FINDINGS  Left Ventricle: Left ventricular ejection fraction, by estimation, is 55 to 60%. The left ventricle has normal function. The left ventricle has no regional wall motion abnormalities. The left ventricular internal cavity size was normal in size. There is  no left ventricular hypertrophy. Left ventricular diastolic parameters are consistent with Grade II diastolic dysfunction (pseudonormalization). Elevated left atrial pressure. Right Ventricle: The right ventricular size is normal. No increase in right ventricular wall thickness. Right ventricular systolic function is normal. There is mildly elevated pulmonary artery systolic pressure. The tricuspid regurgitant velocity is 2.62  m/s, and with an assumed right atrial pressure of 15 mmHg, the estimated right ventricular systolic pressure is 85.8 mmHg. Left Atrium: Left atrial size was mildly dilated. Right Atrium: Right atrial size was normal in size. Pericardium: Trivial pericardial effusion is present. Presence of pericardial fat pad. Mitral Valve: The mitral valve is degenerative in appearance. Moderate mitral annular calcification. Trivial mitral valve regurgitation. No evidence of mitral valve stenosis. Tricuspid Valve: The tricuspid valve is grossly normal. Tricuspid valve regurgitation is trivial. No evidence of tricuspid stenosis. Aortic Valve: The aortic valve is tricuspid. Aortic valve  regurgitation is not visualized. No aortic stenosis is present. Pulmonic Valve: The pulmonic valve was grossly normal. Pulmonic valve regurgitation is not visualized. No evidence of pulmonic stenosis. Aorta: The aortic root and ascending aorta are structurally normal, with no evidence of dilitation. Venous: The inferior vena cava is dilated in size with less than 50% respiratory variability, suggesting right atrial pressure of 15 mmHg. IAS/Shunts: The atrial septum is grossly normal. Additional Comments: There is a small pleural effusion in the left lateral region.  LEFT VENTRICLE PLAX 2D LVIDd:         5.30 cm  Diastology LVIDs:         2.90 cm  LV e' lateral:  5.33 cm/s LV PW:         0.90 cm  LV E/e' lateral: 23.1 LV IVS:        1.10 cm  LV e' medial:    6.42 cm/s LVOT diam:     2.00 cm  LV E/e' medial:  19.2 LV SV:         82 LV SV Index:   48 LVOT Area:     3.14 cm  RIGHT VENTRICLE RV S prime:     9.25 cm/s TAPSE (M-mode): 1.6 cm LEFT ATRIUM             Index       RIGHT ATRIUM           Index LA diam:        4.40 cm 2.57 cm/m  RA Area:     15.20 cm LA Vol (A2C):   70.3 ml 41.04 ml/m RA Volume:   36.30 ml  21.19 ml/m LA Vol (A4C):   55.6 ml 32.46 ml/m LA Biplane Vol: 65.5 ml 38.24 ml/m  AORTIC VALVE LVOT Vmax:   130.00 cm/s LVOT Vmean:  87.300 cm/s LVOT VTI:    0.260 m  AORTA Ao Root diam: 3.60 cm Ao Asc diam:  3.60 cm MITRAL VALVE                TRICUSPID VALVE MV Area (PHT): 3.08 cm     TR Peak grad:   27.5 mmHg MV Decel Time: 246 msec     TR Vmax:        262.00 cm/s MV E velocity: 123.00 cm/s MV A velocity: 114.00 cm/s  SHUNTS MV E/A ratio:  1.08         Systemic VTI:  0.26 m                             Systemic Diam: 2.00 cm Eleonore Chiquito MD Electronically signed by Eleonore Chiquito MD Signature Date/Time: 04/17/2020/1:27:32 PM    Final    DG Hip Unilat W or Wo Pelvis 2-3 Views Left  Result Date: 04/15/2020 CLINICAL DATA:  Status post fall 3 days ago. EXAM: DG HIP (WITH OR WITHOUT PELVIS) 2-3V LEFT  COMPARISON:  February 01, 2020 FINDINGS: There is no evidence of an acute hip fracture or dislocation. Chronic fractures of the left superior and left inferior pubic rami are present. Fragmentation of the left greater trochanter is again noted. 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 There is no evidence of surrounding lucency to suggest the presence of hardware loosening or infection. There is no evidence of arthropathy or other focal bone abnormality. Radiopaque surgical clips are seen overlying the right acetabulum and inferior aspect of the sacrum on the right. IMPRESSION: 1. No evidence of an acute osseous abnormality. 2. 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 Electronically Signed   By: Virgina Norfolk M.D.   On: 04/15/2020 21:38      The results of significant diagnostics from this hospitalization (including imaging, microbiology, ancillary and laboratory) are listed below for reference.     Microbiology: Recent Results (from the past 240 hour(s))  SARS Coronavirus 2 by RT PCR (hospital order, performed in [facility] hospital lab) Nasopharyngeal Nasopharyngeal Swab     Status: None   Collection Time: 04/11/20 11:23 AM   Specimen: Nasopharyngeal Swab  Result Value Ref Range Status   SARS Coronavirus 2 NEGATIVE NEGATIVE Final  Comment: (NOTE) SARS-CoV-2 target nucleic acids are NOT DETECTED. The SARS-CoV-2 RNA is generally detectable in upper and lower respiratory specimens during the acute phase of infection. The lowest concentration of SARS-CoV-2 viral copies this assay can detect is 250 copies / mL. A negative result does not preclude SARS-CoV-2 infection and should not be used as the sole basis for treatment or other patient management decisions.  A negative result may occur with improper specimen collection / handling, submission of specimen other than nasopharyngeal swab, presence of viral mutation(s) within the areas targeted by this assay,  and inadequate number of viral copies (<250 copies / mL). A negative result must be combined with clinical observations, patient history, and epidemiological information. Fact Sheet for Patients:   StrictlyIdeas.no Fact Sheet for Healthcare Providers: BankingDealers.co.za This test is not yet approved or cleared  by the [place] FDA and has been authorized for detection and/or diagnosis of SARS-CoV-2 by FDA under an Emergency Use Authorization (EUA).  This EUA will remain in effect (meaning this test can be used) for the duration of the COVID-19 declaration under Section 564(b)(1) of the Act, 21 U.S.C. section 360bbb-3(b)(1), unless the authorization is terminated or revoked sooner. Performed at [facility], [place] [address] [place], [place] [address]   Blood Culture (routine x 2)     Status: None (Preliminary result)   Collection Time: 04/15/20  8:05 PM   Specimen: BLOOD  Result Value Ref Range Status   Specimen Description BLOOD RIGHT ANTECUBITAL  Final   Special Requests   Final    BOTTLES DRAWN AEROBIC AND ANAEROBIC Blood Culture adequate volume   Culture   Final    NO GROWTH 4 DAYS Performed at [facility], [place] [address], [place], [place] [address]    Report Status PENDING  Incomplete  Blood Culture (routine x 2)     Status: None (Preliminary result)   Collection Time: 04/15/20  8:25 PM   Specimen: BLOOD RIGHT WRIST  Result Value Ref Range Status   Specimen Description BLOOD RIGHT WRIST  Final   Special Requests   Final    BOTTLES DRAWN AEROBIC AND ANAEROBIC Blood Culture adequate volume   Culture   Final    NO GROWTH 4 DAYS Performed at [facility], [place] [address] [place], [place] [address]    Report Status PENDING  Incomplete  SARS Coronavirus 2 by RT PCR (hospital order, performed in [facility] hospital lab) Nasopharyngeal Nasopharyngeal Swab     Status: None   Collection Time: 04/15/20  8:25  PM   Specimen: Nasopharyngeal Swab  Result Value Ref Range Status   SARS Coronavirus 2 NEGATIVE NEGATIVE Final    Comment: (NOTE) SARS-CoV-2 target nucleic acids are NOT DETECTED. The SARS-CoV-2 RNA is generally detectable in upper and lower respiratory specimens during the acute phase of infection. The lowest concentration of SARS-CoV-2 viral copies this assay can detect is 250 copies / mL. A negative result does not preclude SARS-CoV-2 infection and should not be used as the sole basis for treatment or other patient management decisions.  A negative result may occur with improper specimen collection / handling, submission of specimen other than nasopharyngeal swab, presence of viral mutation(s) within the areas targeted by this assay, and inadequate number of viral copies (<250 copies / mL). A negative result must be combined with clinical observations, patient history, and epidemiological information. Fact Sheet for Patients:   StrictlyIdeas.no Fact Sheet for Healthcare Providers: BankingDealers.co.za This test  is not yet approved or cleared  by the [place] and has been authorized for detection and/or diagnosis of SARS-CoV-2 by FDA under an Emergency Use Authorization (EUA).  This EUA will remain in effect (meaning this test can be used) for the duration of the COVID-19 declaration under Section 564(b)(1) of the Act, 21 U.S.C. section 360bbb-3(b)(1), unless the authorization is terminated or revoked sooner. Performed at [facility], [place] [address], [place], [place] [address]   Urine culture     Status: Abnormal   Collection Time: 04/15/20 10:17 PM   Specimen: In/Out Cath Urine  Result Value Ref Range Status   Specimen Description IN/OUT CATH URINE  Final   Special Requests   Final    NONE Performed at [facility], [place] [address], [place], [place] [address]    Culture 3,000 COLONIES/mL KLEBSIELLA  ORNITHINOLYTICA (A)  Final   Report Status 04/18/2020 FINAL  Final   Organism ID, Bacteria KLEBSIELLA ORNITHINOLYTICA (A)  Final      Susceptibility   Klebsiella ornithinolytica - MIC*    AMPICILLIN >=32 RESISTANT Resistant     CEFAZOLIN >=64 RESISTANT Resistant     CEFEPIME 2 SENSITIVE Sensitive     CEFTAZIDIME 16 INTERMEDIATE Intermediate     CEFTRIAXONE >=64 RESISTANT Resistant     CIPROFLOXACIN 2 INTERMEDIATE Intermediate     GENTAMICIN >=16 RESISTANT Resistant     IMIPENEM 0.5 SENSITIVE Sensitive     NITROFURANTOIN <=16 SENSITIVE Sensitive     TRIMETH/SULFA >=320 RESISTANT Resistant     AMPICILLIN/SULBACTAM >=32 RESISTANT Resistant     PIP/TAZO 32 INTERMEDIATE Intermediate     * 3,000 COLONIES/mL KLEBSIELLA ORNITHINOLYTICA     Labs: BNP (last 3 results) Recent Labs    03/13/20 1044 04/07/20 0541 04/16/20 0608  BNP 552.6* 494.5* 8,182.9*   Basic Metabolic Panel: Recent Labs  Lab 04/15/20 2005 04/15/20 2005 04/16/20 0608 04/16/20 0943 04/17/20 0355 04/18/20 0605 04/19/20 1120  NA 134*   < > 138 136 142 137 134*  K 4.1   < > 4.6 4.3 4.3 4.0 4.0  CL 98  --  99  --  101 101 99  CO2 26  --  25  --  26 26 23   GLUCOSE 136*  --  149*  --  159* 190* 282*  BUN 21  --  24*  --  41* 62* 78*  CREATININE 0.77  --  0.86  --  1.02* 1.15* 1.26*  CALCIUM 9.5  --  9.3  --  9.7 9.6 9.8  MG  --   --  1.5*  --  1.8 2.1 2.3   < > = values in this interval not displayed.   Liver Function Tests: Recent Labs  Lab 04/15/20 2005 04/16/20 0608  AST 21 26  ALT 14 15  ALKPHOS 84 79  BILITOT 1.2 2.1*  PROT 7.9 7.2  ALBUMIN 2.5* 2.3*   No results for input(s): LIPASE, AMYLASE in the last 168 hours. No results for input(s): AMMONIA in the last 168 hours. CBC: Recent Labs  Lab 04/15/20 2005 04/15/20 2005 04/16/20 9371 04/16/20 6967 04/16/20 0943 04/17/20 0355 04/18/20 0605 04/18/20 1454 04/19/20 1120  WBC 22.9*  --  24.9*  --   --  12.5* 8.3  --  5.2  NEUTROABS 20.4*  --   23.8*  --   --   --   --   --   --   HGB 8.3*   < >  8.2*   < > 8.5* 7.3* 6.6* 7.9* 8.1*  HCT 28.1*   < > 27.9*   < > 25.0* 24.6* 22.6* 26.6* 27.1*  MCV 100.7*  --  100.7*  --   --  100.4* 100.4*  --  98.9  PLT 234  --  219  --   --  220 177  --  151   < > = values in this interval not displayed.   Cardiac Enzymes: No results for input(s): CKTOTAL, CKMB, CKMBINDEX, TROPONINI in the last 168 hours. BNP: Invalid input(s): POCBNP CBG: No results for input(s): GLUCAP in the last 168 hours. D-Dimer No results for input(s): DDIMER in the last 72 hours. Hgb A1c No results for input(s): HGBA1C in the last 72 hours. Lipid Profile No results for input(s): CHOL, HDL, LDLCALC, TRIG, CHOLHDL, LDLDIRECT in the last 72 hours. Thyroid function studies No results for input(s): TSH, T4TOTAL, T3FREE, THYROIDAB in the last 72 hours.  Invalid input(s): FREET3 Anemia work up No results for input(s): VITAMINB12, FOLATE, FERRITIN, TIBC, IRON, RETICCTPCT in the last 72 hours. Urinalysis    Component Value Date/Time   COLORURINE AMBER (A) 04/15/2020 2005   APPEARANCEUR CLOUDY (A) 04/15/2020 2005   LABSPEC 1.018 04/15/2020 2005   PHURINE 5.0 04/15/2020 2005   GLUCOSEU NEGATIVE 04/15/2020 2005   HGBUR SMALL (A) 04/15/2020 2005   BILIRUBINUR NEGATIVE 04/15/2020 2005   KETONESUR NEGATIVE 04/15/2020 2005   PROTEINUR >=300 (A) 04/15/2020 2005   UROBILINOGEN 0.2 05/15/2013 0733   NITRITE NEGATIVE 04/15/2020 2005   LEUKOCYTESUR LARGE (A) 04/15/2020 2005   Sepsis Labs Invalid input(s): PROCALCITONIN,  WBC,  LACTICIDVEN Microbiology Recent Results (from the past 240 hour(s))  SARS Coronavirus 2 by RT PCR (hospital order, performed in [place] [facility] hospital lab) Nasopharyngeal Nasopharyngeal Swab     Status: None   Collection Time: 04/11/20 11:23 AM   Specimen: Nasopharyngeal Swab  Result Value Ref Range Status   SARS Coronavirus 2 NEGATIVE NEGATIVE Final    Comment: (NOTE) SARS-CoV-2 target nucleic  acids are NOT DETECTED. The SARS-CoV-2 RNA is generally detectable in upper and lower respiratory specimens during the acute phase of infection. The lowest concentration of SARS-CoV-2 viral copies this assay can detect is 250 copies / mL. A negative result does not preclude SARS-CoV-2 infection and should not be used as the sole basis for treatment or other patient management decisions.  A negative result may occur with improper specimen collection / handling, submission of specimen other than nasopharyngeal swab, presence of viral mutation(s) within the areas targeted by this assay, and inadequate number of viral copies (<250 copies / mL). A negative result must be combined with clinical observations, patient history, and epidemiological information. Fact Sheet for Patients:   StrictlyIdeas.no Fact Sheet for Healthcare Providers: BankingDealers.co.za This test is not yet approved or cleared  by the [place] FDA and has been authorized for detection and/or diagnosis of SARS-CoV-2 by FDA under an Emergency Use Authorization (EUA).  This EUA will remain in effect (meaning this test can be used) for the duration of the COVID-19 declaration under Section 564(b)(1) of the Act, 21 U.S.C. section 360bbb-3(b)(1), unless the authorization is terminated or revoked sooner. Performed at [facility], [place] [address] [place], [place] [address]   Blood Culture (routine x 2)     Status: None (Preliminary result)   Collection Time: 04/15/20  8:05 PM   Specimen: BLOOD  Result Value Ref Range Status   Specimen Description BLOOD RIGHT ANTECUBITAL  Final   Special Requests   Final    BOTTLES DRAWN AEROBIC AND ANAEROBIC Blood Culture adequate volume   Culture   Final    NO GROWTH 4 DAYS Performed at [facility], [address], [place], [place] [address]    Report Status PENDING  Incomplete  Blood Culture (routine x 2)     Status: None  (Preliminary result)   Collection Time: 04/15/20  8:25 PM   Specimen: BLOOD RIGHT WRIST  Result Value Ref Range Status   Specimen Description BLOOD RIGHT WRIST  Final   Special Requests   Final    BOTTLES DRAWN AEROBIC AND ANAEROBIC Blood Culture adequate volume   Culture   Final    NO GROWTH 4 DAYS Performed at [facility], [place] [address] [place], [place] [address]    Report Status PENDING  Incomplete  SARS Coronavirus 2 by RT PCR (hospital order, performed in [facility] hospital lab) Nasopharyngeal Nasopharyngeal Swab     Status: None   Collection Time: 04/15/20  8:25 PM   Specimen: Nasopharyngeal Swab  Result Value Ref Range Status   SARS Coronavirus 2 NEGATIVE NEGATIVE Final    Comment: (NOTE) SARS-CoV-2 target nucleic acids are NOT DETECTED. The SARS-CoV-2 RNA is generally detectable in upper and lower respiratory specimens during the acute phase of infection. The lowest concentration of SARS-CoV-2 viral copies this assay can detect is 250 copies / mL. A negative result does not preclude SARS-CoV-2 infection and should not be used as the sole basis for treatment or other patient management decisions.  A negative result may occur with improper specimen collection / handling, submission of specimen other than nasopharyngeal swab, presence of viral mutation(s) within the areas targeted by this assay, and inadequate number of viral copies (<250 copies / mL). A negative result must be combined with clinical observations, patient history, and epidemiological information. Fact Sheet for Patients:   StrictlyIdeas.no Fact Sheet for Healthcare Providers: BankingDealers.co.za This test is not yet approved or cleared  by the [place] FDA and has been authorized for detection and/or diagnosis of SARS-CoV-2 by FDA under an Emergency Use Authorization (EUA).  This EUA will remain in effect (meaning this test can be used) for the  duration of the COVID-19 declaration under Section 564(b)(1) of the Act, 21 U.S.C. section 360bbb-3(b)(1), unless the authorization is terminated or revoked sooner. Performed at [facility], [place] [address] [place], [place] [address]   Urine culture     Status: Abnormal   Collection Time: 04/15/20 10:17 PM   Specimen: In/Out Cath Urine  Result Value Ref Range Status   Specimen Description IN/OUT CATH URINE  Final   Special Requests   Final    NONE Performed at [facility], [place] [address] [place], [place] [address]    Culture 3,000 COLONIES/mL KLEBSIELLA ORNITHINOLYTICA (A)  Final   Report Status 04/18/2020 FINAL  Final   Organism ID, Bacteria KLEBSIELLA ORNITHINOLYTICA (A)  Final      Susceptibility   Klebsiella ornithinolytica - MIC*    AMPICILLIN >=32 RESISTANT Resistant     CEFAZOLIN >=64 RESISTANT Resistant     CEFEPIME 2 SENSITIVE Sensitive     CEFTAZIDIME 16 INTERMEDIATE Intermediate     CEFTRIAXONE >=64 RESISTANT Resistant     CIPROFLOXACIN 2 INTERMEDIATE Intermediate     GENTAMICIN >=16 RESISTANT Resistant     IMIPENEM 0.5 SENSITIVE Sensitive     NITROFURANTOIN <=16 SENSITIVE Sensitive  TRIMETH/SULFA >=320 RESISTANT Resistant     AMPICILLIN/SULBACTAM >=32 RESISTANT Resistant     PIP/TAZO 32 INTERMEDIATE Intermediate     * 3,000 COLONIES/mL KLEBSIELLA ORNITHINOLYTICA     Time coordinating discharge:  I have spent 35 minutes face to face with the patient and on the ward discussing the patients care, assessment, plan and disposition with other care givers. >50% of the time was devoted counseling the patient about the risks and benefits of treatment/Discharge disposition and coordinating care.   SIGNED:   Damita Lack, MD  [facility] 04/19/2020, 1:45 PM   If 7PM-7AM, please contact night-coverage

## 2020-04-19 NOTE — TOC Initial Note (Addendum)
Transition of Care [facility]) - Initial/Assessment Note    Patient Details  Name: April [name] MRN: [id number] Date of Birth: [birth year]  Transition of Care [facility]) CM/SW Contact:    Benard Halsted, [place] Phone Number: 04/19/2020, 10:26 AM  Clinical Narrative:                 CSW spoke with patient to confirm discharge plan back to [place]. Patient is in agreement and stated that her daughter would drop her clothes off at [place] once she returns from the beach. CSW received insurance approval: Ref# [id number], [id number] through 6/2. Per [place], no updated COVID test needed. Patient requesting PTAR for transport.   Expected Discharge Plan: [place] Barriers to Discharge: No Barriers Identified   Patient Goals and CMS Choice Patient states their goals for this hospitalization and ongoing recovery are:: Return to [facility].gov Compare Post Acute Care list provided to:: Patient Choice offered to / list presented to : Patient  Expected Discharge Plan and Services Expected Discharge Plan: [place] In-house Referral: Clinical Social Work   Post Acute Care Choice: [place] Living arrangements for the past 2 months: [place] Expected Discharge Date: 04/19/20                                    Prior Living Arrangements/Services Living arrangements for the past 2 months: [place] Lives with:: Facility Resident Patient language and need for interpreter reviewed:: Yes Do you feel safe going back to the place where you live?: Yes      Need for Family Participation in Patient Care: No (Comment) Care giver support system in place?: Yes (comment)   Criminal Activity/Legal Involvement Pertinent to Current Situation/Hospitalization: No - Comment as needed  Activities of Daily Living      Permission Sought/Granted Permission sought to share information with : Facility Event organiser granted to share information with : Yes, Verbal Permission Granted     Permission granted to share info w AGENCY: [place]        Emotional Assessment Appearance:: Appears older than stated age Attitude/Demeanor/Rapport: Engaged, Gracious Affect (typically observed): Accepting, Appropriate, Pleasant Orientation: : Oriented to Self, Oriented to Place, Oriented to  Time, Oriented to Situation Alcohol / Substance Use: Not Applicable Psych Involvement: No (comment)  Admission diagnosis:  Shortness of breath [R06.02] Acute respiratory distress [R06.03] Back pain [M54.9] Dyspnea [R06.00] Sepsis due to Gram negative bacteria (HCC) [A41.50] Urinary tract infection without hematuria, site unspecified [N39.0] Sepsis without acute organ dysfunction, due to unspecified organism [facility]) [A41.9] Patient Active Problem List   Diagnosis Date Noted  . Sepsis due to gram-negative UTI ([place]) 04/16/2020  . Pericardial effusion 04/16/2020  . Chronic respiratory failure with hypoxia ([place]) 04/16/2020  . Proteinuria 04/16/2020  . Acute respiratory distress 04/16/2020  . Pressure injury of skin 04/08/2020  . COPD exacerbation ([place]) 04/07/2020  . Acute on chronic respiratory failure with hypoxia ([place]) 03/14/2020  . PNA (pneumonia) 03/13/2020  . SVT (supraventricular tachycardia) ([place])   . Anemia of chronic disease   . Palliative care encounter   . Goals of care, counseling/discussion   . Essential hypertension 02/08/2020  . Glaucoma 02/08/2020  . Sepsis ([place]) 01/31/2020  . Hypoxia   . Shortness of breath   . Fracture of left superior pubic ramus (HCC)   . Pain   . Anxiety state   .  Left displaced femoral neck fracture ([place]) 12/15/2019  . Status post total hip replacement, left   . Post-op pain   . Acute blood loss anemia   . Polycystic kidney   . Closed left hip fracture ([place]) 12/07/2019  . Left wrist fracture 12/07/2019  . Medication management 09/22/2019  . Physical  deconditioning 09/22/2019  . Hyponatremia 06/29/2019  . Acute respiratory failure with hypoxia ([place]) 04/19/2019  . Acute on chronic diastolic CHF (congestive heart failure) ([place]) 04/19/2019  . Bilateral closed proximal tibial fracture 12/21/2018  . Closed right ankle fracture 12/21/2018  . Fracture 12/21/2018  . Lower urinary tract infectious disease 10/03/2018  . Asthma, chronic, unspecified asthma severity, with acute exacerbation 10/03/2018  . SIRS (systemic inflammatory response syndrome) ([place]) 10/03/2018  . Nausea vomiting and diarrhea 10/02/2018  . Chronic diastolic CHF (congestive heart failure) ([place]) 10/01/2017  . Influenza B 10/01/2017  . Persistent asthma with status asthmaticus   . Pneumonia 07/15/2016  . Asthma, mild intermittent 07/15/2016  . GERD without esophagitis 07/15/2016  . Renal transplant recipient 07/15/2016  . Hyperlipidemia 07/15/2016  . Depression 07/15/2016  . Bronchitis, mucopurulent recurrent ([place]) 08/08/2014  . Chronic cough 07/24/2014  . End stage renal disease ([place]) 03/23/2012  . 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, implant, and graft 03/23/2012   PCP:  [name], MD Pharmacy:  No Pharmacies Listed    Social Determinants of Health (SDOH) Interventions    Readmission Risk Interventions Readmission Risk Prevention Plan 04/19/2020 02/09/2020 04/20/2019  Transportation Screening Complete Complete Complete  PCP or Specialist Appt within 3-5 Days - - Not Complete  Not Complete comments - - not yet ready for d/c  HRI or [place] - - Not Complete  HRI or Home Care Consult comments - - no needs identified  Social Work Consult for [place] Planning/Counseling - - Not Complete  SW consult not completed comments - - no needs identified  Palliative Care Screening - - Not Applicable  Medication Review (RN Care Manager) Complete Referral to Pharmacy Complete  PCP or Specialist appointment within 3-5 days of discharge Complete  Complete -  [place] or Home Care Consult Complete Complete -  SW Recovery Care/Counseling Consult Complete - -  Palliative Care Screening Not Applicable Complete -  [place] Complete Patient Refused -  Some recent data might be hidden

## 2020-04-20 LAB — CULTURE, BLOOD (ROUTINE X 2)
Culture: NO GROWTH
Culture: NO GROWTH
Special Requests: ADEQUATE
Special Requests: ADEQUATE

## 2020-04-29 ENCOUNTER — Emergency Department (HOSPITAL_COMMUNITY): Payer: Medicare Other

## 2020-04-29 ENCOUNTER — Other Ambulatory Visit: Payer: Self-pay

## 2020-04-29 ENCOUNTER — Observation Stay (HOSPITAL_COMMUNITY): Payer: Medicare Other

## 2020-04-29 ENCOUNTER — Inpatient Hospital Stay (HOSPITAL_COMMUNITY)
Admission: EM | Admit: 2020-04-29 | Discharge: 2020-05-03 | DRG: 202 | Disposition: A | Discharge status: Skilled Nursing Facility | Payer: Medicare Other | Source: Skilled Nursing Facility | Attending: Family Medicine | Admitting: Family Medicine

## 2020-04-29 DIAGNOSIS — L89322 Pressure ulcer of left buttock, stage 2: Secondary | ICD-10-CM | POA: Diagnosis present

## 2020-04-29 DIAGNOSIS — J449 Chronic obstructive pulmonary disease, unspecified: Secondary | ICD-10-CM | POA: Diagnosis present

## 2020-04-29 DIAGNOSIS — Z6830 Body mass index (BMI) 30.0-30.9, adult: Secondary | ICD-10-CM

## 2020-04-29 DIAGNOSIS — T380X5A Adverse effect of glucocorticoids and synthetic analogues, initial encounter: Secondary | ICD-10-CM | POA: Diagnosis present

## 2020-04-29 DIAGNOSIS — Z8249 Family history of ischemic heart disease and other diseases of the circulatory system: Secondary | ICD-10-CM

## 2020-04-29 DIAGNOSIS — Z20822 Contact with and (suspected) exposure to covid-19: Secondary | ICD-10-CM | POA: Diagnosis present

## 2020-04-29 DIAGNOSIS — J4541 Moderate persistent asthma with (acute) exacerbation: Secondary | ICD-10-CM | POA: Diagnosis not present

## 2020-04-29 DIAGNOSIS — R739 Hyperglycemia, unspecified: Secondary | ICD-10-CM | POA: Diagnosis present

## 2020-04-29 DIAGNOSIS — J45909 Unspecified asthma, uncomplicated: Secondary | ICD-10-CM | POA: Diagnosis present

## 2020-04-29 DIAGNOSIS — Z87440 Personal history of urinary (tract) infections: Secondary | ICD-10-CM

## 2020-04-29 DIAGNOSIS — J4521 Mild intermittent asthma with (acute) exacerbation: Principal | ICD-10-CM | POA: Diagnosis present

## 2020-04-29 DIAGNOSIS — I471 Supraventricular tachycardia: Secondary | ICD-10-CM | POA: Diagnosis present

## 2020-04-29 DIAGNOSIS — Z940 Kidney transplant status: Secondary | ICD-10-CM

## 2020-04-29 DIAGNOSIS — J9621 Acute and chronic respiratory failure with hypoxia: Secondary | ICD-10-CM | POA: Diagnosis present

## 2020-04-29 DIAGNOSIS — Z8271 Family history of polycystic kidney: Secondary | ICD-10-CM

## 2020-04-29 DIAGNOSIS — A419 Sepsis, unspecified organism: Secondary | ICD-10-CM

## 2020-04-29 DIAGNOSIS — E669 Obesity, unspecified: Secondary | ICD-10-CM | POA: Diagnosis present

## 2020-04-29 DIAGNOSIS — E871 Hypo-osmolality and hyponatremia: Secondary | ICD-10-CM | POA: Diagnosis present

## 2020-04-29 DIAGNOSIS — Q613 Polycystic kidney, unspecified: Secondary | ICD-10-CM

## 2020-04-29 DIAGNOSIS — J4520 Mild intermittent asthma, uncomplicated: Secondary | ICD-10-CM | POA: Diagnosis present

## 2020-04-29 DIAGNOSIS — L89153 Pressure ulcer of sacral region, stage 3: Secondary | ICD-10-CM | POA: Diagnosis present

## 2020-04-29 DIAGNOSIS — L89150 Pressure ulcer of sacral region, unstageable: Secondary | ICD-10-CM | POA: Diagnosis present

## 2020-04-29 DIAGNOSIS — J9811 Atelectasis: Secondary | ICD-10-CM | POA: Diagnosis present

## 2020-04-29 DIAGNOSIS — Z96642 Presence of left artificial hip joint: Secondary | ICD-10-CM | POA: Diagnosis present

## 2020-04-29 DIAGNOSIS — Z9981 Dependence on supplemental oxygen: Secondary | ICD-10-CM

## 2020-04-29 DIAGNOSIS — I110 Hypertensive heart disease with heart failure: Secondary | ICD-10-CM | POA: Diagnosis present

## 2020-04-29 LAB — CBC WITH DIFFERENTIAL/PLATELET
Abs Immature Granulocytes: 0.05 10*3/uL (ref 0.00–0.07)
Basophils Absolute: 0 10*3/uL (ref 0.0–0.1)
Basophils Relative: 0 %
Eosinophils Absolute: 0.1 10*3/uL (ref 0.0–0.5)
Eosinophils Relative: 1 %
HCT: 29 % — ABNORMAL LOW (ref 36.0–46.0)
Hemoglobin: 8.7 g/dL — ABNORMAL LOW (ref 12.0–15.0)
Immature Granulocytes: 0 %
Lymphocytes Relative: 3 %
Lymphs Abs: 0.4 10*3/uL — ABNORMAL LOW (ref 0.7–4.0)
MCH: 30.3 pg (ref 26.0–34.0)
MCHC: 30 g/dL (ref 30.0–36.0)
MCV: 101 fL — ABNORMAL HIGH (ref 80.0–100.0)
Monocytes Absolute: 0.4 10*3/uL (ref 0.1–1.0)
Monocytes Relative: 3 %
Neutro Abs: 13.2 10*3/uL — ABNORMAL HIGH (ref 1.7–7.7)
Neutrophils Relative %: 93 %
Platelets: 199 10*3/uL (ref 150–400)
RBC: 2.87 MIL/uL — ABNORMAL LOW (ref 3.87–5.11)
RDW: 23.4 % — ABNORMAL HIGH (ref 11.5–15.5)
WBC: 14.1 10*3/uL — ABNORMAL HIGH (ref 4.0–10.5)
nRBC: 0 % (ref 0.0–0.2)

## 2020-04-29 LAB — URINALYSIS, ROUTINE W REFLEX MICROSCOPIC
Bilirubin Urine: NEGATIVE
Glucose, UA: NEGATIVE mg/dL
Hgb urine dipstick: NEGATIVE
Ketones, ur: NEGATIVE mg/dL
Leukocytes,Ua: NEGATIVE
Nitrite: NEGATIVE
Protein, ur: 30 mg/dL — AB
Specific Gravity, Urine: 1.014 (ref 1.005–1.030)
pH: 5 (ref 5.0–8.0)

## 2020-04-29 LAB — COMPREHENSIVE METABOLIC PANEL
ALT: 28 U/L (ref 0–44)
AST: 23 U/L (ref 15–41)
Albumin: 2.3 g/dL — ABNORMAL LOW (ref 3.5–5.0)
Alkaline Phosphatase: 102 U/L (ref 38–126)
Anion gap: 11 (ref 5–15)
BUN: 39 mg/dL — ABNORMAL HIGH (ref 8–23)
CO2: 23 mmol/L (ref 22–32)
Calcium: 9 mg/dL (ref 8.9–10.3)
Chloride: 96 mmol/L — ABNORMAL LOW (ref 98–111)
Creatinine, Ser: 0.89 mg/dL (ref 0.44–1.00)
GFR calc Af Amer: >60 mL/min (ref >60)
GFR calc non Af Amer: >60 mL/min (ref >60)
Glucose, Bld: 289 mg/dL — ABNORMAL HIGH (ref 70–99)
Potassium: 3.7 mmol/L (ref 3.5–5.1)
Sodium: 130 mmol/L — ABNORMAL LOW (ref 135–145)
Total Bilirubin: 1.2 mg/dL (ref 0.3–1.2)
Total Protein: 6.6 g/dL (ref 6.5–8.1)

## 2020-04-29 LAB — GLUCOSE, CAPILLARY: Glucose-Capillary: 176 mg/dL — ABNORMAL HIGH (ref 70–99)

## 2020-04-29 LAB — HEMOGLOBIN A1C
Hgb A1c MFr Bld: 5.9 % — ABNORMAL HIGH (ref 4.8–5.6)
Mean Plasma Glucose: 122.63 mg/dL

## 2020-04-29 LAB — PROTIME-INR
INR: 1.2 (ref 0.8–1.2)
Prothrombin Time: 15 seconds (ref 11.4–15.2)

## 2020-04-29 LAB — HIV ANTIBODY (ROUTINE TESTING W REFLEX): HIV Screen 4th Generation wRfx: NONREACTIVE

## 2020-04-29 LAB — CBG MONITORING, ED
Glucose-Capillary: 218 mg/dL — ABNORMAL HIGH (ref 70–99)
Glucose-Capillary: 190 mg/dL — ABNORMAL HIGH (ref 70–99)

## 2020-04-29 LAB — LACTIC ACID, PLASMA
Lactic Acid, Venous: 1.3 mmol/L (ref 0.5–1.9)
Lactic Acid, Venous: 1.1 mmol/L (ref 0.5–1.9)

## 2020-04-29 LAB — PROCALCITONIN: Procalcitonin: 0.23 ng/mL

## 2020-04-29 LAB — SARS CORONAVIRUS 2 BY RT PCR (HOSPITAL ORDER, PERFORMED IN ~~LOC~~ HOSPITAL LAB): SARS Coronavirus 2: NEGATIVE

## 2020-04-29 LAB — APTT: aPTT: 28 seconds (ref 24–36)

## 2020-04-29 MED ORDER — CYCLOSPORINE 0.05 % OP EMUL
1.0000 [drp] | Freq: Two times a day (BID) | OPHTHALMIC | Status: DC
  Administered 2020-04-29 – 2020-05-03 (×8): 1 [drp] via OPHTHALMIC
  Filled 2020-04-29 (×9): qty 1

## 2020-04-29 MED ORDER — DM-GUAIFENESIN ER 30-600 MG PO TB12
1.0000 | ORAL_TABLET | Freq: Two times a day (BID) | ORAL | Status: DC | PRN
  Filled 2020-04-29: qty 1

## 2020-04-29 MED ORDER — BRIMONIDINE TARTRATE 0.15 % OP SOLN
1.0000 [drp] | Freq: Two times a day (BID) | OPHTHALMIC | Status: DC
  Administered 2020-04-29 – 2020-05-03 (×8): 1 [drp] via OPHTHALMIC
  Filled 2020-04-29: qty 5

## 2020-04-29 MED ORDER — DOCUSATE SODIUM 100 MG PO CAPS
100.0000 mg | ORAL_CAPSULE | Freq: Two times a day (BID) | ORAL | Status: DC
  Administered 2020-04-29 – 2020-05-03 (×8): 100 mg via ORAL
  Filled 2020-04-29 (×8): qty 1

## 2020-04-29 MED ORDER — ADULT MULTIVITAMIN W/MINERALS CH
1.0000 | ORAL_TABLET | Freq: Every day | ORAL | Status: DC
  Administered 2020-04-30 – 2020-05-03 (×4): 1 via ORAL
  Filled 2020-04-29 (×4): qty 1

## 2020-04-29 MED ORDER — PREDNISONE 20 MG PO TABS
40.0000 mg | ORAL_TABLET | Freq: Every day | ORAL | Status: DC
  Administered 2020-04-30: 40 mg via ORAL
  Filled 2020-04-29: qty 2

## 2020-04-29 MED ORDER — ENOXAPARIN SODIUM 40 MG/0.4ML ~~LOC~~ SOLN
40.0000 mg | SUBCUTANEOUS | Status: DC
  Administered 2020-04-29 – 2020-05-02 (×4): 40 mg via SUBCUTANEOUS
  Filled 2020-04-29 (×4): qty 0.4

## 2020-04-29 MED ORDER — TACROLIMUS 0.5 MG PO CAPS
0.5000 mg | ORAL_CAPSULE | Freq: Every day | ORAL | Status: DC
  Administered 2020-04-29 – 2020-05-02 (×4): 0.5 mg via ORAL
  Filled 2020-04-29 (×5): qty 1

## 2020-04-29 MED ORDER — VITAMIN B-12 1000 MCG PO TABS
1000.0000 ug | ORAL_TABLET | Freq: Every day | ORAL | Status: DC
  Administered 2020-04-30 – 2020-05-03 (×4): 1000 ug via ORAL
  Filled 2020-04-29 (×4): qty 1

## 2020-04-29 MED ORDER — ENSURE ENLIVE PO LIQD
237.0000 mL | Freq: Two times a day (BID) | ORAL | Status: DC
  Administered 2020-04-30: 237 mL via ORAL
  Filled 2020-04-29: qty 237

## 2020-04-29 MED ORDER — ONDANSETRON HCL 4 MG PO TABS: 4.0000 mg | ORAL_TABLET | Freq: Four times a day (QID) | ORAL | Status: DC | PRN

## 2020-04-29 MED ORDER — LATANOPROST 0.005 % OP SOLN
1.0000 [drp] | Freq: Every day | OPHTHALMIC | Status: DC
  Administered 2020-04-29 – 2020-05-02 (×4): 1 [drp] via OPHTHALMIC
  Filled 2020-04-29: qty 2.5

## 2020-04-29 MED ORDER — MYCOPHENOLATE SODIUM 180 MG PO TBEC
180.0000 mg | DELAYED_RELEASE_TABLET | Freq: Two times a day (BID) | ORAL | Status: DC
  Administered 2020-04-29 – 2020-05-03 (×8): 180 mg via ORAL
  Filled 2020-04-29 (×8): qty 1

## 2020-04-29 MED ORDER — DOXYCYCLINE HYCLATE 100 MG PO TABS
100.0000 mg | ORAL_TABLET | Freq: Two times a day (BID) | ORAL | Status: DC
  Administered 2020-04-29 – 2020-05-03 (×8): 100 mg via ORAL
  Filled 2020-04-29 (×8): qty 1

## 2020-04-29 MED ORDER — PRO-STAT SUGAR FREE PO LIQD
30.0000 mL | Freq: Two times a day (BID) | ORAL | Status: DC
  Administered 2020-04-29 – 2020-05-03 (×9): 30 mL via ORAL
  Filled 2020-04-29 (×10): qty 30

## 2020-04-29 MED ORDER — DORZOLAMIDE HCL 2 % OP SOLN
1.0000 [drp] | Freq: Every day | OPHTHALMIC | Status: DC
  Administered 2020-04-30 – 2020-05-03 (×4): 1 [drp] via OPHTHALMIC
  Filled 2020-04-29: qty 10

## 2020-04-29 MED ORDER — IPRATROPIUM-ALBUTEROL 0.5-2.5 (3) MG/3ML IN SOLN
3.0000 mL | Freq: Two times a day (BID) | RESPIRATORY_TRACT | Status: DC
  Administered 2020-04-30 – 2020-05-03 (×7): 3 mL via RESPIRATORY_TRACT
  Filled 2020-04-29 (×8): qty 3

## 2020-04-29 MED ORDER — ALBUTEROL SULFATE (2.5 MG/3ML) 0.083% IN NEBU: 3.0000 mL | INHALATION_SOLUTION | RESPIRATORY_TRACT | Status: DC | PRN

## 2020-04-29 MED ORDER — TACROLIMUS 1 MG PO CAPS
1.0000 mg | ORAL_CAPSULE | Freq: Every day | ORAL | Status: DC
  Administered 2020-04-30 – 2020-05-03 (×4): 1 mg via ORAL
  Filled 2020-04-29 (×4): qty 1

## 2020-04-29 MED ORDER — ASCORBIC ACID 500 MG PO TABS
500.0000 mg | ORAL_TABLET | Freq: Every day | ORAL | Status: DC
  Administered 2020-04-30 – 2020-05-03 (×4): 500 mg via ORAL
  Filled 2020-04-29 (×4): qty 1

## 2020-04-29 MED ORDER — ASPIRIN 81 MG PO CHEW
81.0000 mg | CHEWABLE_TABLET | Freq: Every day | ORAL | Status: DC
  Administered 2020-04-30 – 2020-05-03 (×4): 81 mg via ORAL
  Filled 2020-04-29 (×4): qty 1

## 2020-04-29 MED ORDER — MAGNESIUM OXIDE 400 (241.3 MG) MG PO TABS
400.0000 mg | ORAL_TABLET | Freq: Two times a day (BID) | ORAL | Status: DC
  Administered 2020-04-29 – 2020-05-03 (×8): 400 mg via ORAL
  Filled 2020-04-29 (×8): qty 1

## 2020-04-29 MED ORDER — ACETAMINOPHEN 325 MG PO TABS: 650.0000 mg | ORAL_TABLET | Freq: Four times a day (QID) | ORAL | Status: DC | PRN

## 2020-04-29 MED ORDER — INSULIN ASPART 100 UNIT/ML ~~LOC~~ SOLN
0.0000 [IU] | Freq: Three times a day (TID) | SUBCUTANEOUS | Status: DC
  Administered 2020-04-29 – 2020-04-30 (×3): 2 [IU] via SUBCUTANEOUS
  Administered 2020-05-01 (×2): 3 [IU] via SUBCUTANEOUS
  Administered 2020-05-01: 7 [IU] via SUBCUTANEOUS
  Administered 2020-05-02: 3 [IU] via SUBCUTANEOUS
  Administered 2020-05-02: 5 [IU] via SUBCUTANEOUS
  Administered 2020-05-02: 2 [IU] via SUBCUTANEOUS
  Administered 2020-05-03 (×3): 3 [IU] via SUBCUTANEOUS

## 2020-04-29 MED ORDER — BUDESONIDE 0.5 MG/2ML IN SUSP
0.5000 mg | Freq: Two times a day (BID) | RESPIRATORY_TRACT | Status: DC
  Administered 2020-04-30 – 2020-05-03 (×7): 0.5 mg via RESPIRATORY_TRACT
  Filled 2020-04-29 (×8): qty 2

## 2020-04-29 MED ORDER — ACETAMINOPHEN 650 MG RE SUPP: 650.0000 mg | Freq: Four times a day (QID) | RECTAL | Status: DC | PRN

## 2020-04-29 MED ORDER — FERROUS SULFATE 325 (65 FE) MG PO TABS
325.0000 mg | ORAL_TABLET | Freq: Every day | ORAL | Status: DC
  Administered 2020-04-30 – 2020-05-03 (×4): 325 mg via ORAL
  Filled 2020-04-29 (×4): qty 1

## 2020-04-29 MED ORDER — VITAMIN D 25 MCG (1000 UNIT) PO TABS
1000.0000 [IU] | ORAL_TABLET | Freq: Every day | ORAL | Status: DC
  Administered 2020-04-30 – 2020-05-02 (×3): 1000 [IU] via ORAL
  Filled 2020-04-29 (×3): qty 1

## 2020-04-29 MED ORDER — HYDROXYZINE HCL 10 MG PO TABS
10.0000 mg | ORAL_TABLET | Freq: Two times a day (BID) | ORAL | Status: DC | PRN
  Filled 2020-04-29: qty 1

## 2020-04-29 MED ORDER — SODIUM CHLORIDE 0.9 % IV SOLN
2.0000 g | Freq: Once | INTRAVENOUS | Status: AC
  Administered 2020-04-29: 2 g via INTRAVENOUS
  Filled 2020-04-29: qty 2

## 2020-04-29 MED ORDER — VERAPAMIL HCL ER 240 MG PO TBCR
240.0000 mg | EXTENDED_RELEASE_TABLET | Freq: Every day | ORAL | Status: DC
  Administered 2020-04-30 – 2020-05-03 (×4): 240 mg via ORAL
  Filled 2020-04-29 (×4): qty 1

## 2020-04-29 MED ORDER — FLUTICASONE FUROATE-VILANTEROL 200-25 MCG/INH IN AEPB
1.0000 | INHALATION_SPRAY | Freq: Every day | RESPIRATORY_TRACT | Status: DC
  Administered 2020-04-30 – 2020-05-02 (×3): 1 via RESPIRATORY_TRACT
  Filled 2020-04-29: qty 28

## 2020-04-29 MED ORDER — FUROSEMIDE 40 MG PO TABS
40.0000 mg | ORAL_TABLET | Freq: Every day | ORAL | Status: DC
  Administered 2020-04-30 – 2020-05-03 (×4): 40 mg via ORAL
  Filled 2020-04-29 (×4): qty 1

## 2020-04-29 MED ORDER — POTASSIUM CHLORIDE CRYS ER 20 MEQ PO TBCR
20.0000 meq | EXTENDED_RELEASE_TABLET | Freq: Once | ORAL | Status: AC
  Administered 2020-04-29: 20 meq via ORAL
  Filled 2020-04-29: qty 1

## 2020-04-29 MED ORDER — ONDANSETRON HCL 4 MG/2ML IJ SOLN: 4.0000 mg | Freq: Four times a day (QID) | INTRAMUSCULAR | Status: DC | PRN

## 2020-04-29 MED ORDER — FLUOXETINE HCL 20 MG PO CAPS
40.0000 mg | ORAL_CAPSULE | Freq: Every day | ORAL | Status: DC
  Administered 2020-04-30 – 2020-05-03 (×4): 40 mg via ORAL
  Filled 2020-04-29 (×4): qty 2

## 2020-04-29 MED ORDER — VALACYCLOVIR HCL 500 MG PO TABS
500.0000 mg | ORAL_TABLET | Freq: Two times a day (BID) | ORAL | Status: DC
  Administered 2020-04-29 – 2020-05-03 (×8): 500 mg via ORAL
  Filled 2020-04-29 (×8): qty 1

## 2020-04-29 MED ORDER — PROSOURCE NO CARB PO LIQD
30.0000 mL | Freq: Two times a day (BID) | ORAL | Status: DC
  Filled 2020-04-29: qty 30

## 2020-04-29 MED ORDER — ALLOPURINOL 300 MG PO TABS
300.0000 mg | ORAL_TABLET | Freq: Every day | ORAL | Status: DC
  Administered 2020-04-30 – 2020-05-03 (×4): 300 mg via ORAL
  Filled 2020-04-29 (×4): qty 1

## 2020-04-29 MED ORDER — PANTOPRAZOLE SODIUM 40 MG PO TBEC
40.0000 mg | DELAYED_RELEASE_TABLET | Freq: Two times a day (BID) | ORAL | Status: DC
  Administered 2020-04-29 – 2020-05-03 (×8): 40 mg via ORAL
  Filled 2020-04-29 (×8): qty 1

## 2020-04-29 MED ORDER — GABAPENTIN 100 MG PO CAPS
200.0000 mg | ORAL_CAPSULE | Freq: Three times a day (TID) | ORAL | Status: DC
  Administered 2020-04-29 – 2020-05-03 (×13): 200 mg via ORAL
  Filled 2020-04-29 (×14): qty 2

## 2020-04-29 NOTE — H&P (Signed)
History and Physical    April [name] ENI:[id number] DOB: Apr 26, 1956 DOA: 04/29/2020  PCP: [name], MD (Confirm with patient/family/NH records and if not entered, this has to be entered at [facility] point of entry) Patient coming from: Home   I have personally briefly reviewed patient's old medical records in [place]  Chief Complaint: SOB and wheezing  HPI: April [name] is a 64 y.o. female with medical history significant of polycystic kidney disease s/p renal transplant (on prednisone, mycophenalate, prograf), COPD on 2L [place] baseline, diastolic CHF, chronic hypoxic resp failure, obesity, HTN, recurrent UTI, presenting to ED with SOB wheezing and palpitations.  Her symptoms started this morning, she denied any cough, but only short of breath even at rest, associated with occasional wheezing, she also had left-sided sharp chest pain on the rib cage worsened with movement.  No fever or chills, no abdominal point no diarrhea no urinary symptoms.  Patient was treated for UTI 2 weeks ago.  ED Course: Chest x-ray clear, WBC 14.1, Lactic acid 1.13>>1.1. O2 sat 99% on 2L  Review of Systems: As per HPI otherwise 10 point review of systems negative.    Past Medical History:  Diagnosis Date  . Arthritis   . Asthma   . CHF (congestive heart failure) ([place])   . COPD (chronic obstructive pulmonary disease) ([place])   . Depression   . Essential hypertension 02/08/2020  . GERD (gastroesophageal reflux disease)   . Hyperlipidemia   . Hyperparathyroidism   . Polycystic kidney   . PONV (postoperative nausea and vomiting)     Past Surgical History:  Procedure Laterality Date  . 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 BILATERAL OOPHORECTOMY    . CARPAL TUNNEL RELEASE  2000  . CESAREAN SECTION    . INSERTION OF DIALYSIS CATHETER  03/31/2012   Procedure: INSERTION OF DIALYSIS CATHETER;  Surgeon: Angelia Mould, MD;  Location: [facility];  Service: Vascular;   Laterality: N/A;  insertion of dialysis catheter right internal jugular vein  . 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 ORIF TIBIA FRACTURE Left 12/23/2018   Procedure: OPEN REDUCTION INTERNAL FIXATION (ORIF) TIBIA FRACTURE;  Surgeon: Shona Needles, MD;  Location: [facility];  Service: Orthopedics;  Laterality: Left;  . ORIF TIBIA PLATEAU Right 12/23/2018   Procedure: OPEN REDUCTION INTERNAL FIXATION (ORIF) TIBIAL PLATEAU;  Surgeon: Shona Needles, MD;  Location: [facility];  Service: Orthopedics;  Laterality: Right;  . ORIF WRIST FRACTURE Left 12/08/2019   Procedure: OPEN REDUCTION INTERNAL FIXATION (ORIF) WRIST FRACTURE, REPAIR LACERATION LEFT FOREARM;  Surgeon: Dorna Leitz, MD;  Location: [facility];  Service: Orthopedics;  Laterality: Left;  . TONSILLECTOMY  1967  . TOTAL HIP ARTHROPLASTY Left 12/08/2019   Procedure: TOTAL HIP ARTHROPLASTY ANTERIOR APPROACH;  Surgeon: Dorna Leitz, MD;  Location: [facility];  Service: Orthopedics;  Laterality: Left;  . TUBAL LIGATION  2010     reports that she quit smoking about 22 years ago. Her smoking use included cigarettes. She has a 3.75 pack-year smoking history. She has never used smokeless tobacco. She reports that she does not drink alcohol or use drugs.  Allergies  Allergen Reactions  . Infed [Iron Dextran] Other (See Comments)    Chest tightness  . Pentamidine Itching, Shortness Of Breath and Swelling  . Erythromycin [Erythromycin] Other (See Comments)    Mouth Ulcers  . Iohexol Other (See Comments)    Per patient "she has had a kidney  transplant and should never have contrast"  . Oxycodone Nausea Only and Nausea And Vomiting  . Erythromycin Rash    Causes breakout in mouth  . Ultram [Tramadol Hcl] Anxiety    Family History  Problem Relation Age of Onset  . Hypertension Mother   . Heart disease Father        CABG history  . Polycystic kidney disease Father   . Polycystic kidney disease Brother      Prior to Admission medications   Medication Sig  Start Date End Date Taking? Authorizing Provider  albuterol (VENTOLIN HFA) 108 (90 Base) MCG/ACT inhaler Inhale 2 puffs into the lungs every 4 (four) hours as needed for wheezing or shortness of breath. 01/04/20   Angiulli, Lavon Paganini, PA-C  allopurinol (ZYLOPRIM) 300 MG tablet Take 1 tablet (300 mg total) by mouth daily. 01/04/20   Angiulli, Lavon Paganini, PA-C  Amino Acids-Protein Hydrolys (FEEDING SUPPLEMENT, PRO-STAT SUGAR FREE 64,) LIQD Take 30 mLs by mouth 2 (two) times daily with a meal.    [provider]  ascorbic acid (VITAMIN C) 500 MG tablet Take 500 mg by mouth daily.    [provider]  aspirin 81 MG chewable tablet Chew 81 mg by mouth daily.    [provider]  brimonidine (ALPHAGAN P) 0.1 % SOLN Place 1 drop into both eyes 2 (two) times daily.     [provider]  budesonide (PULMICORT) 0.5 MG/2ML nebulizer solution Take 2 mLs (0.5 mg total) by nebulization 2 (two) times daily. 04/19/20   Amin, Jeanella Flattery, MD  cholecalciferol (VITAMIN D3) 25 MCG (1000 UNIT) tablet Take 1 tablet (1,000 Units total) by mouth daily. 01/04/20   Angiulli, Lavon Paganini, PA-C  cycloSPORINE (RESTASIS) 0.05 % ophthalmic emulsion Place 1 drop into both eyes 2 (two) times daily. 01/04/20   Angiulli, Lavon Paganini, PA-C  dextromethorphan-guaiFENesin (MUCINEX DM) 30-600 MG 12hr tablet Take 1 tablet by mouth 2 (two) times daily as needed for cough. 04/19/20   Amin, Jeanella Flattery, MD  docusate sodium (COLACE) 100 MG capsule Take 1 capsule (100 mg total) by mouth 2 (two) times daily. 12/13/19   Shelly Coss, MD  dorzolamide (TRUSOPT) 2 % ophthalmic solution Place 1 drop into both eyes daily. 06/25/19   [provider]  feeding supplement, ENSURE ENLIVE, (ENSURE ENLIVE) LIQD Take 237 mLs by mouth 2 (two) times daily between meals. 02/23/20   Alma Friendly, MD  ferrous sulfate 325 (65 FE) MG tablet Take 325 mg by mouth daily with breakfast.    [provider]  FLUoxetine (PROZAC) 40  MG capsule Take 1 capsule (40 mg total) by mouth daily. 01/04/20   Angiulli, Lavon Paganini, PA-C  furosemide (LASIX) 40 MG tablet Take 1 tablet (40 mg total) by mouth daily. 02/07/20   Raiford Noble Latif, DO  gabapentin (NEURONTIN) 100 MG capsule Take 200 mg by mouth 3 (three) times daily.    [provider]  guaiFENesin (MUCINEX) 600 MG 12 hr tablet Take 1 tablet (600 mg total) by mouth 2 (two) times daily. 02/06/20   Raiford Noble Latif, DO  hydrOXYzine (ATARAX/VISTARIL) 10 MG tablet Take 10 mg by mouth 2 (two) times daily as needed for anxiety.    [provider]  ipratropium-albuterol (DUONEB) 0.5-2.5 (3) MG/3ML SOLN Take 3 mLs by nebulization 2 (two) times daily. 03/18/20 04/17/20  Arrien, Jimmy Picket, MD  magnesium oxide (MAG-OX) 400 MG tablet Take 1 tablet (400 mg total) by mouth 2 (two) times daily. 01/04/20  Angiulli, Lavon Paganini, PA-C  Multiple Vitamin (MULTIVITAMIN WITH MINERALS) TABS tablet Take 1 tablet by mouth daily. 02/24/20   Alma Friendly, MD  mycophenolate (MYFORTIC) 180 MG EC tablet Take 180 mg by mouth 2 (two) times daily.    [provider]  pantoprazole (PROTONIX) 40 MG tablet Take 1 tablet (40 mg total) by mouth 2 (two) times daily. 01/04/20   Angiulli, Lavon Paganini, PA-C  predniSONE (DELTASONE) 5 MG tablet Take 1.5 tablets (7.5 mg total) by mouth daily with breakfast. 03/18/20   Arrien, Jimmy Picket, MD  PROGRAF 1 MG capsule Take 1 mg by mouth daily. 03/22/19   [provider]  protein supplement (PROSOURCE NO CARB) LIQD Take 30 mLs by mouth in the morning and at bedtime.    [provider]  SYMBICORT 160-4.5 MCG/ACT inhaler Inhale 2 puffs into the lungs 2 (two) times daily. 01/04/20   Angiulli, Lavon Paganini, PA-C  tacrolimus (PROGRAF) 0.5 MG capsule Take 0.5 mg by mouth at bedtime. Takes brand name Prograf    [provider]  TRAVATAN Z 0.004 % SOLN ophthalmic solution Place 1 drop into both eyes at bedtime. 07/03/16   [provider]  tuberculin (APLISOL) 5 UNIT/0.1ML injection Inject 5 Units into the skin once.    [provider]  valACYclovir (VALTREX) 500 MG tablet Take 500 mg by mouth 2 (two) times daily.    [provider]  verapamil (CALAN-SR) 240 MG CR tablet Take 1 tablet (240 mg total) by mouth daily. 03/19/20 04/18/20  Arrien, Jimmy Picket, MD  vitamin B-12 (CYANOCOBALAMIN) 100 MCG tablet Take 1,000 mcg by mouth daily.     [provider]    Physical Exam: Vitals:   04/29/20 1430 04/29/20 1445 04/29/20 1500 04/29/20 1515  BP: 125/71 131/84 127/80 128/71  Pulse: (!) 107 (!) 101 100 99  Resp: (!) 28 (!) 22 (!) 24 20  Temp:      TempSrc:      SpO2: 97% 99% 96% 99%  Weight:      Height:        Constitutional: NAD, calm, comfortable Vitals:   04/29/20 1430 04/29/20 1445 04/29/20 1500 04/29/20 1515  BP: 125/71 131/84 127/80 128/71  Pulse: (!) 107 (!) 101 100 99  Resp: (!) 28 (!) 22 (!) 24 20  Temp:      TempSrc:      SpO2: 97% 99% 96% 99%  Weight:      Height:       Eyes: PERRL, lids and conjunctivae normal ENMT: Mucous membranes are moist. Posterior pharynx clear of any exudate or lesions.Normal dentition.  Neck: normal, supple, no masses, no thyromegaly Respiratory: Diminished breathing sound bilaterally, scattered bilateral wheezing, no crackles. Normal respiratory effort. No accessory muscle use.  Tenderness on deep palpation down the left rib cage Cardiovascular: Regular rate and rhythm, no murmurs / rubs / gallops. No extremity edema. 2+ pedal pulses. No carotid bruits.  Abdomen: no tenderness, no masses palpated. No hepatosplenomegaly. Bowel sounds positive.  Musculoskeletal: no clubbing / cyanosis. No joint deformity upper and lower extremities. Good ROM, no contractures. Normal muscle tone.  Skin: no rashes, lesions, ulcers. No induration Neurologic: CN 2-12 grossly intact. Sensation intact, DTR normal. Strength 5/5 in all 4.  Psychiatric: Normal  judgment and insight. Alert and oriented x 3. Normal mood.     Labs on Admission: I have personally reviewed following labs and imaging studies  CBC: Recent Labs  Lab 04/29/20 1343  WBC 14.1*  NEUTROABS 13.2*  HGB 8.7*  HCT 29.0*  MCV 101.0*  PLT 326   Basic Metabolic Panel: Recent Labs  Lab 04/29/20 1343  NA 130*  K 3.7  CL 96*  CO2 23  GLUCOSE 289*  BUN 39*  CREATININE 0.89  CALCIUM 9.0   GFR: Estimated Creatinine Clearance: 59 mL/min (by C-G formula based on SCr of 0.89 mg/dL). Liver Function Tests: Recent Labs  Lab 04/29/20 1343  AST 23  ALT 28  ALKPHOS 102  BILITOT 1.2  PROT 6.6  ALBUMIN 2.3*   No results for input(s): LIPASE, AMYLASE in the last 168 hours. No results for input(s): AMMONIA in the last 168 hours. Coagulation Profile: Recent Labs  Lab 04/29/20 1343  INR 1.2   Cardiac Enzymes: No results for input(s): CKTOTAL, CKMB, CKMBINDEX, TROPONINI in the last 168 hours. BNP (last 3 results) No results for input(s): PROBNP in the last 8760 hours. HbA1C: No results for input(s): HGBA1C in the last 72 hours. CBG: No results for input(s): GLUCAP in the last 168 hours. Lipid Profile: No results for input(s): CHOL, HDL, LDLCALC, TRIG, CHOLHDL, LDLDIRECT in the last 72 hours. Thyroid Function Tests: No results for input(s): TSH, T4TOTAL, FREET4, T3FREE, THYROIDAB in the last 72 hours. Anemia Panel: No results for input(s): VITAMINB12, FOLATE, FERRITIN, TIBC, IRON, RETICCTPCT in the last 72 hours. Urine analysis:    Component Value Date/Time   COLORURINE YELLOW 04/29/2020 1350   APPEARANCEUR CLEAR 04/29/2020 1350   LABSPEC 1.014 04/29/2020 1350   PHURINE 5.0 04/29/2020 1350   GLUCOSEU NEGATIVE 04/29/2020 1350   HGBUR NEGATIVE 04/29/2020 1350   BILIRUBINUR NEGATIVE 04/29/2020 1350   KETONESUR NEGATIVE 04/29/2020 1350   PROTEINUR 30 (A) 04/29/2020 1350   UROBILINOGEN 0.2 05/15/2013 0733   NITRITE NEGATIVE 04/29/2020 1350   LEUKOCYTESUR  NEGATIVE 04/29/2020 1350    Radiological Exams on Admission: DG Chest Port 1 View  Result Date: 04/29/2020 CLINICAL DATA:  Shortness of breath EXAM: PORTABLE CHEST 1 VIEW COMPARISON:  Apr 16, 2020 FINDINGS: There is atelectatic change in the lower lung zones bilaterally, stable. No edema or airspace opacity. Heart is mildly enlarged. The pulmonary vascularity is within normal limits. No adenopathy. No bone lesions. IMPRESSION: Mild bibasilar atelectasis. No edema or airspace opacity. Heart mildly enlarged with pulmonary vascularity within normal limits. No evident adenopathy. Electronically Signed   By: Lowella Grip III M.D.   On: 04/29/2020 14:39    EKG: Independently reviewed. Sinus tachy, no ST-T changes  Assessment/Plan Active Problems:   Asthma, mild intermittent   Asthma  Acute asthma exacerbation, sepsis unlikely -Has elevated WBC count, however no fever and no cough and no infiltrates on x-ray, will check procalcitonin level, -de-escalate cefepime to p.o. doxycycline -Treat asthma exacerbation with increased dose of prednisone (she has been on 5 mg for kidney transplant rejection prevention) -Breathing treatment -CT chest pending  Chronic hypoxic respiratory failure -As above, versus no significant increasing oxygen demand  Chest pain -With tenderness, likely muscular, PRN pain meds  Hyperglycemia -No history of diabetes, probably from steroid use. -Sliding scale for now -A1C pending  Hx of SVT -Borderline sinus tachycardia now, continue verapamil -Replenish potassium to 4  Hyponatremia -Related to hyperglycemia  History of kidney transplant -Continue 3 immune modulation medications and steroid  DVT prophylaxis: Lovenox Code Status: Full code Family Communication: None at bedside Disposition Plan: Expect less than two midnight hospital stay Consults called: None Admission status: MedSurg Obs   Lequita Halt MD [facility] Pager [phone]  04/29/2020,  3:51 PM

## 2020-04-29 NOTE — ED Notes (Signed)
Report gotten at 1916 from [place] on pt to be admitted, per day shift medications not given as not verify.

## 2020-04-29 NOTE — Progress Notes (Signed)
New Admission Note: ? Arrival Method: via stretcher  Mental Orientation: A/O x 4  Telemetry: Box #18 NSR Assessment: Completed Skin: Refer to flowsheet IV: Right AC and Right FA  Pain: 0/10 Tubes: Safety Measures: Safety Fall Prevention Plan discussed with patient. Admission: Completed 5 [place] Orientation: Patient has been orientated to the room, unit and the staff.  Orders have been reviewed and are being implemented. Will continue to monitor the patient. Call light has been placed within reach and bed alarm has been activated.  ? [employer], [place]

## 2020-04-29 NOTE — ED Notes (Signed)
Heart healthy dinner tray ordered 

## 2020-04-29 NOTE — ED Provider Notes (Signed)
[facility] EMERGENCY DEPARTMENT Provider Note   CSN: [id number] Arrival date & time: 04/29/20  1240     History Chief Complaint  Patient presents with  . Shortness of Breath    April [name] is a 64 y.o. female w/ hx of polycystic kidney disease s/p renal transplant (on prednisone, mycophenalate, prograf), COPD on 2L [place] baseline, diastolic CHF, chronic hypoxic resp failure, obesity, HTN, recurrent UTI, presenting to ED with chest pain and palpitations.  Sent from [facility] with concern for chest pain and tachycardic.  Patient feels about the same as baseline but some substernal chest pain.  She says it feels like her "bronchitis and airway disease" from the past.    She was most recently discharged on 04/19/20 after hospitalization for COPD exacberation and MDR Klebsiella UTI, treated with IV antibiotics (Cefepime, susceptible) and discharged on bactrim for 1 day (of note, klebsiella was resistant to bactrim).  HPI     Past Medical History:  Diagnosis Date  . Arthritis   . Asthma   . CHF (congestive heart failure) ([place])   . COPD (chronic obstructive pulmonary disease) ([place])   . Depression   . Essential hypertension 02/08/2020  . GERD (gastroesophageal reflux disease)   . Hyperlipidemia   . Hyperparathyroidism   . Polycystic kidney   . PONV (postoperative nausea and vomiting)     Patient Active Problem List   Diagnosis Date Noted  . Asthma 04/29/2020  . Sepsis due to gram-negative UTI ([place]) 04/16/2020  . Pericardial effusion 04/16/2020  . Chronic respiratory failure with hypoxia ([place]) 04/16/2020  . Proteinuria 04/16/2020  . Acute respiratory distress 04/16/2020  . Pressure injury of skin 04/08/2020  . COPD exacerbation ([place]) 04/07/2020  . Acute on chronic respiratory failure with hypoxia ([place]) 03/14/2020  . PNA (pneumonia) 03/13/2020  . SVT (supraventricular tachycardia) ([place])   . Anemia of chronic disease   . Palliative care encounter   .  Goals of care, counseling/discussion   . Essential hypertension 02/08/2020  . Glaucoma 02/08/2020  . Sepsis ([place]) 01/31/2020  . Hypoxia   . Shortness of breath   . Fracture of left superior pubic ramus (HCC)   . Pain   . Anxiety state   . Left displaced femoral neck fracture ([place]) 12/15/2019  . Status post total hip replacement, left   . Post-op pain   . Acute blood loss anemia   . Polycystic kidney   . Closed left hip fracture ([place]) 12/07/2019  . Left wrist fracture 12/07/2019  . Medication management 09/22/2019  . Physical deconditioning 09/22/2019  . Hyponatremia 06/29/2019  . Acute respiratory failure with hypoxia ([place]) 04/19/2019  . Acute on chronic diastolic CHF (congestive heart failure) ([place]) 04/19/2019  . Bilateral closed proximal tibial fracture 12/21/2018  . Closed right ankle fracture 12/21/2018  . Fracture 12/21/2018  . Lower urinary tract infectious disease 10/03/2018  . Asthma, chronic, unspecified asthma severity, with acute exacerbation 10/03/2018  . SIRS (systemic inflammatory response syndrome) ([place]) 10/03/2018  . Nausea vomiting and diarrhea 10/02/2018  . Chronic diastolic CHF (congestive heart failure) ([place]) 10/01/2017  . Influenza B 10/01/2017  . Persistent asthma with status asthmaticus   . Pneumonia 07/15/2016  . Asthma, mild intermittent 07/15/2016  . GERD without esophagitis 07/15/2016  . Renal transplant recipient 07/15/2016  . Hyperlipidemia 07/15/2016  . Depression 07/15/2016  . Bronchitis, mucopurulent recurrent ([place]) 08/08/2014  . Chronic cough 07/24/2014  . End stage renal disease ([place]) 03/23/2012  . Other complications due to  renal dialysis device, implant, and graft 03/23/2012    Past Surgical History:  Procedure Laterality Date  . 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 BILATERAL OOPHORECTOMY    . CARPAL TUNNEL RELEASE  2000  . CESAREAN SECTION    . INSERTION OF DIALYSIS CATHETER  03/31/2012   Procedure:  INSERTION OF DIALYSIS CATHETER;  Surgeon: Angelia Mould, MD;  Location: [facility];  Service: Vascular;  Laterality: N/A;  insertion of dialysis catheter right internal jugular vein  . 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 ORIF TIBIA FRACTURE Left 12/23/2018   Procedure: OPEN REDUCTION INTERNAL FIXATION (ORIF) TIBIA FRACTURE;  Surgeon: Shona Needles, MD;  Location: [facility];  Service: Orthopedics;  Laterality: Left;  . ORIF TIBIA PLATEAU Right 12/23/2018   Procedure: OPEN REDUCTION INTERNAL FIXATION (ORIF) TIBIAL PLATEAU;  Surgeon: Shona Needles, MD;  Location: [facility];  Service: Orthopedics;  Laterality: Right;  . ORIF WRIST FRACTURE Left 12/08/2019   Procedure: OPEN REDUCTION INTERNAL FIXATION (ORIF) WRIST FRACTURE, REPAIR LACERATION LEFT FOREARM;  Surgeon: Dorna Leitz, MD;  Location: [facility];  Service: Orthopedics;  Laterality: Left;  . TONSILLECTOMY  1967  . TOTAL HIP ARTHROPLASTY Left 12/08/2019   Procedure: TOTAL HIP ARTHROPLASTY ANTERIOR APPROACH;  Surgeon: Dorna Leitz, MD;  Location: [facility];  Service: Orthopedics;  Laterality: Left;  . TUBAL LIGATION  2010     OB History   No obstetric history on file.     Family History  Problem Relation Age of Onset  . Hypertension Mother   . Heart disease Father        CABG history  . Polycystic kidney disease Father   . Polycystic kidney disease Brother     Social History   Tobacco Use  . Smoking status: Former Smoker    Packs/day: 0.25    Years: 15.00    Pack years: 3.75    Types: Cigarettes    Quit date: 03/22/1998    Years since quitting: 22.1  . Smokeless tobacco: Never Used  Substance Use Topics  . Alcohol use: No    Comment: social  . Drug use: No    Home Medications Prior to Admission medications   Medication Sig Start Date End Date Taking? Authorizing Provider  albuterol (VENTOLIN HFA) 108 (90 Base) MCG/ACT inhaler Inhale 2 puffs into the lungs every 4 (four) hours as needed for wheezing or shortness of breath. 01/04/20   Yes Angiulli, Lavon Paganini, PA-C  allopurinol (ZYLOPRIM) 300 MG tablet Take 1 tablet (300 mg total) by mouth daily. 01/04/20  Yes Angiulli, Lavon Paganini, PA-C  Amino Acids-Protein Hydrolys (FEEDING SUPPLEMENT, PRO-STAT SUGAR FREE 64,) LIQD Take 30 mLs by mouth 2 (two) times daily with a meal.   Yes [provider]  ascorbic acid (VITAMIN C) 500 MG tablet Take 500 mg by mouth daily.   Yes [provider]  aspirin 81 MG chewable tablet Chew 81 mg by mouth daily.   Yes [provider]  brimonidine (ALPHAGAN P) 0.1 % SOLN Place 1 drop into both eyes 2 (two) times daily.    Yes [provider]  budesonide (PULMICORT) 0.5 MG/2ML nebulizer solution Take 2 mLs (0.5 mg total) by nebulization 2 (two) times daily. 04/19/20  Yes Amin, Jeanella Flattery, MD  cholecalciferol (VITAMIN D3) 25 MCG (1000 UNIT) tablet Take 1 tablet (1,000 Units total) by mouth daily. 01/04/20  Yes Angiulli, Lavon Paganini, PA-C  collagenase (SANTYL) ointment Apply 1 application topically  daily.   Yes [provider]  cycloSPORINE (RESTASIS) 0.05 % ophthalmic emulsion Place 1 drop into both eyes 2 (two) times daily. 01/04/20  Yes Angiulli, Lavon Paganini, PA-C  dextromethorphan-guaiFENesin (MUCINEX DM) 30-600 MG 12hr tablet Take 1 tablet by mouth 2 (two) times daily as needed for cough. 04/19/20  Yes Amin, Jeanella Flattery, MD  docusate sodium (COLACE) 100 MG capsule Take 1 capsule (100 mg total) by mouth 2 (two) times daily. Patient taking differently: Take 100 mg by mouth at bedtime.  12/13/19  Yes Shelly Coss, MD  dorzolamide (TRUSOPT) 2 % ophthalmic solution Place 1 drop into both eyes daily. 06/25/19  Yes [provider]  feeding supplement, ENSURE ENLIVE, (ENSURE ENLIVE) LIQD Take 237 mLs by mouth 2 (two) times daily between meals. 02/23/20  Yes Alma Friendly, MD  ferrous sulfate 325 (65 FE) MG tablet Take 325 mg by mouth daily with breakfast.   Yes [provider]  FLUoxetine (PROZAC) 40 MG  capsule Take 1 capsule (40 mg total) by mouth daily. 01/04/20  Yes Angiulli, Lavon Paganini, PA-C  furosemide (LASIX) 40 MG tablet Take 1 tablet (40 mg total) by mouth daily. 02/07/20  Yes Sheikh, Omair Latif, DO  gabapentin (NEURONTIN) 100 MG capsule Take 200 mg by mouth 3 (three) times daily.   Yes [provider]  guaiFENesin (MUCINEX) 600 MG 12 hr tablet Take 1 tablet (600 mg total) by mouth 2 (two) times daily. 02/06/20  Yes Sheikh, Omair Latif, DO  hydrOXYzine (ATARAX/VISTARIL) 10 MG tablet Take 10 mg by mouth 2 (two) times daily as needed for anxiety.   Yes [provider]  ipratropium-albuterol (DUONEB) 0.5-2.5 (3) MG/3ML SOLN Take 3 mLs by nebulization 2 (two) times daily. 03/18/20 04/29/20 Yes Arrien, Jimmy Picket, MD  magnesium oxide (MAG-OX) 400 MG tablet Take 1 tablet (400 mg total) by mouth 2 (two) times daily. 01/04/20  Yes Angiulli, Lavon Paganini, PA-C  Multiple Vitamin (MULTIVITAMIN WITH MINERALS) TABS tablet Take 1 tablet by mouth daily. 02/24/20  Yes Alma Friendly, MD  mycophenolate (MYFORTIC) 180 MG EC tablet Take 180 mg by mouth 2 (two) times daily.   Yes [provider]  pantoprazole (PROTONIX) 40 MG tablet Take 1 tablet (40 mg total) by mouth 2 (two) times daily. 01/04/20  Yes Angiulli, Lavon Paganini, PA-C  predniSONE (DELTASONE) 5 MG tablet Take 1.5 tablets (7.5 mg total) by mouth daily with breakfast. 03/18/20  Yes Arrien, Jimmy Picket, MD  PROGRAF 1 MG capsule Take 1 mg by mouth daily. 03/22/19  Yes [provider]  protein supplement (PROSOURCE NO CARB) LIQD Take 30 mLs by mouth in the morning and at bedtime.   Yes [provider]  sennosides-docusate sodium (SENOKOT-S) 8.6-50 MG tablet Take 1 tablet by mouth daily.   Yes [provider]  SYMBICORT 160-4.5 MCG/ACT inhaler Inhale 2 puffs into the lungs 2 (two) times daily. 01/04/20  Yes Angiulli, Lavon Paganini, PA-C  tacrolimus (PROGRAF) 0.5 MG capsule Take 0.5 mg by mouth at bedtime. Takes  brand name Prograf   Yes [provider]  TRAVATAN Z 0.004 % SOLN ophthalmic solution Place 1 drop into both eyes at bedtime. 07/03/16  Yes [provider]  tuberculin (APLISOL) 5 UNIT/0.1ML injection Inject 5 Units into the skin once.   Yes [provider]  valACYclovir (VALTREX) 500 MG tablet Take 500 mg by mouth 2 (two) times daily.   Yes [provider]  verapamil (CALAN-SR) 240 MG CR tablet Take 1 tablet (  240 mg total) by mouth daily. 03/19/20 04/29/20 Yes Arrien, Jimmy Picket, MD  vitamin B-12 (CYANOCOBALAMIN) 100 MCG tablet Take 1,000 mcg by mouth daily.    Yes [provider]    Allergies    Infed [iron dextran], Pentamidine, Erythromycin [erythromycin], Iohexol, Oxycodone, Erythromycin, and Ultram [tramadol hcl]  Review of Systems   Review of Systems  Constitutional: Negative for chills and fever.  HENT: Negative for ear pain and sore throat.   Eyes: Negative for pain and visual disturbance.  Respiratory: Positive for cough and shortness of breath.   Cardiovascular: Positive for chest pain. Negative for palpitations.  Gastrointestinal: Negative for abdominal pain and vomiting.  Genitourinary: Negative for dysuria and hematuria.  Musculoskeletal: Negative for arthralgias and back pain.  Skin: Negative for color change and rash.  Neurological: Negative for seizures and syncope.  Psychiatric/Behavioral: Negative for agitation and confusion.  All other systems reviewed and are negative.   Physical Exam Updated Vital Signs BP (!) 107/54   Pulse 92   Temp 99.6 F (37.6 C) (Oral)   Resp 20   Ht 5\' 1"  (1.549 m)   Wt 70.8 kg   SpO2 92%   BMI 29.48 kg/m   Physical Exam Vitals and nursing note reviewed.  Constitutional:      General: She is not in acute distress.    Appearance: She is well-developed.  HENT:     Head: Normocephalic and atraumatic.  Eyes:     Conjunctiva/sclera: Conjunctivae normal.  Cardiovascular:     Rate  and Rhythm: Normal rate and regular rhythm.  Pulmonary:     Effort: Pulmonary effort is normal. No respiratory distress.     Breath sounds: Normal breath sounds.  Abdominal:     Palpations: Abdomen is soft.     Tenderness: There is no abdominal tenderness.  Musculoskeletal:     Cervical back: Neck supple.  Skin:    General: Skin is warm and dry.  Neurological:     General: No focal deficit present.     Mental Status: She is alert and oriented to person, place, and time.  Psychiatric:        Mood and Affect: Mood normal.        Behavior: Behavior normal.     ED Results / Procedures / Treatments   Labs (all labs ordered are listed, but only abnormal results are displayed) Labs Reviewed  COMPREHENSIVE METABOLIC PANEL - Abnormal; Notable for the following components:      Result Value   Sodium 130 (*)    Chloride 96 (*)    Glucose, Bld 289 (*)    BUN 39 (*)    Albumin 2.3 (*)    All other components within normal limits  CBC WITH DIFFERENTIAL/PLATELET - Abnormal; Notable for the following components:   WBC 14.1 (*)    RBC 2.87 (*)    Hemoglobin 8.7 (*)    HCT 29.0 (*)    MCV 101.0 (*)    RDW 23.4 (*)    Neutro Abs 13.2 (*)    Lymphs Abs 0.4 (*)    All other components within normal limits  URINALYSIS, ROUTINE W REFLEX MICROSCOPIC - Abnormal; Notable for the following components:   Protein, ur 30 (*)    Bacteria, UA RARE (*)    All other components within normal limits  CULTURE, BLOOD (ROUTINE X 2)  CULTURE, BLOOD (ROUTINE X 2)  URINE CULTURE  SARS CORONAVIRUS 2 BY RT PCR (HOSPITAL ORDER, [facility])  LACTIC ACID, PLASMA  LACTIC ACID, PLASMA  APTT  PROTIME-INR  HIV ANTIBODY (ROUTINE TESTING W REFLEX)  PROCALCITONIN  HEMOGLOBIN A1C  PROCALCITONIN  CBC  BASIC METABOLIC PANEL    EKG EKG Interpretation  Date/Time:  Monday April 29 2020 12:47:18 EDT Ventricular Rate:  116 PR Interval:    QRS Duration: 88 QT Interval:  358 QTC  Calculation: 498 R Axis:   47 Text Interpretation: Sinus tachycardia Borderline T abnormalities, inferior leads Borderline prolonged QT interval No STEMI NO sig change fromr Prior tracing Confirmed by Octaviano Glow [phone]) on 04/29/2020 1:07:56 PM   Radiology CT Chest Wo Contrast  Result Date: 04/29/2020 CLINICAL DATA:  Pneumonia. Pleural effusion or abscess suspected. Follow-up to current chest radiograph. EXAM: CT CHEST WITHOUT CONTRAST TECHNIQUE: Multidetector CT imaging of the chest was performed following the standard protocol without IV contrast. COMPARISON:  Current chest radiograph.  CT chest dated 04/15/2020. FINDINGS: Cardiovascular: Heart is mildly enlarged. No pericardial effusion. Minimal left coronary artery calcifications. Aorta is normal in caliber. Minor atherosclerotic calcifications along the descending portion. Main pulmonary artery is dilated to 3.8 cm. Mediastinum/Nodes: No neck base, mediastinal or hilar masses or enlarged lymph nodes. Trachea and esophagus are unremarkable. Lungs/Pleura: Small right and trace left pleural effusions. Mild dependent atelectasis noted in both lower lobes and minimally at the diaphragmatic bases of the left upper lobe lingula right middle lobe and dependent upper lobes. Minor linear atelectasis in the left upper lobe. No convincing pneumonia and no evidence of pulmonary edema. No mass or suspicious nodule. No pneumothorax. Upper Abdomen: Multiple liver and bilateral renal cysts consistent with polycystic kidney disease. Enlarged spleen, 14.5 cm greatest transverse dimension. No acute findings in the visualized upper abdomen and no change from the prior CT. Musculoskeletal: Multiple old bilateral rib fractures. No acute fractures. No osteoblastic or osteolytic lesions. IMPRESSION: 1. Small right and trace left pleural effusions. There is associated atelectasis, but no convincing pneumonia and no evidence of pulmonary edema. No abscess. The lung appearance  is similar to the previous chest CT. 2. Mild cardiomegaly. 3. Enlarged main pulmonary artery suggesting pulmonary artery hypertension. 4. Stable changes polycystic kidney disease including multiple liver cysts. Stable splenomegaly. Aortic Atherosclerosis (ICD10-I70.0). Electronically Signed   By: Lajean Manes M.D.   On: 04/29/2020 16:57   DG Chest Port 1 View  Result Date: 04/29/2020 CLINICAL DATA:  Shortness of breath EXAM: PORTABLE CHEST 1 VIEW COMPARISON:  Apr 16, 2020 FINDINGS: There is atelectatic change in the lower lung zones bilaterally, stable. No edema or airspace opacity. Heart is mildly enlarged. The pulmonary vascularity is within normal limits. No adenopathy. No bone lesions. IMPRESSION: Mild bibasilar atelectasis. No edema or airspace opacity. Heart mildly enlarged with pulmonary vascularity within normal limits. No evident adenopathy. Electronically Signed   By: Lowella Grip III M.D.   On: 04/29/2020 14:39    Procedures .Critical Care Performed by: Wyvonnia Dusky, MD Authorized by: Wyvonnia Dusky, MD   Critical care provider statement:    Critical care time (minutes):  40   Critical care was necessary to treat or prevent imminent or life-threatening deterioration of the following conditions:  Sepsis   Critical care was time spent personally by me on the following activities:  Discussions with consultants, evaluation of patient's response to treatment, examination of patient, ordering and performing treatments and interventions, ordering and review of laboratory studies, ordering and review of radiographic studies, pulse oximetry, re-evaluation of patient's condition, obtaining history from patient  or surrogate and review of old charts   (including critical care time)  Medications Ordered in ED Medications  allopurinol (ZYLOPRIM) tablet 300 mg (has no administration in time range)  aspirin chewable tablet 81 mg (has no administration in time range)  valACYclovir  (VALTREX) tablet 500 mg (has no administration in time range)  furosemide (LASIX) tablet 40 mg (has no administration in time range)  verapamil (CALAN-SR) CR tablet 240 mg (has no administration in time range)  FLUoxetine (PROZAC) capsule 40 mg (has no administration in time range)  hydrOXYzine (ATARAX/VISTARIL) tablet 10 mg (has no administration in time range)  predniSONE (DELTASONE) tablet 40 mg (has no administration in time range)  docusate sodium (COLACE) capsule 100 mg (has no administration in time range)  magnesium oxide (MAG-OX) tablet 400 mg (has no administration in time range)  pantoprazole (PROTONIX) EC tablet 40 mg (has no administration in time range)  ferrous sulfate tablet 325 mg (has no administration in time range)  vitamin B-12 (CYANOCOBALAMIN) tablet 1,000 mcg (has no administration in time range)  mycophenolate (MYFORTIC) EC tablet 180 mg (has no administration in time range)  tacrolimus (PROGRAF) capsule 1 mg (has no administration in time range)  tacrolimus (PROGRAF) capsule 0.5 mg (has no administration in time range)  gabapentin (NEURONTIN) capsule 200 mg (200 mg Oral Given 04/29/20 1649)  feeding supplement (PRO-STAT SUGAR FREE 64) liquid 30 mL (has no administration in time range)  ascorbic acid (VITAMIN C) tablet 500 mg (has no administration in time range)  cholecalciferol (VITAMIN D3) tablet 1,000 Units (has no administration in time range)  feeding supplement (ENSURE ENLIVE) (ENSURE ENLIVE) liquid 237 mL (has no administration in time range)  multivitamin with minerals tablet 1 tablet (has no administration in time range)  protein supplement (PROSOURCE NO CARB) liquid 30 mL (has no administration in time range)  albuterol (PROVENTIL) (2.5 MG/3ML) 0.083% nebulizer solution 3 mL (has no administration in time range)  budesonide (PULMICORT) nebulizer solution 0.5 mg (has no administration in time range)  dextromethorphan-guaiFENesin (MUCINEX DM) 30-600 MG per 12 hr  tablet 1 tablet (has no administration in time range)  ipratropium-albuterol (DUONEB) 0.5-2.5 (3) MG/3ML nebulizer solution 3 mL (has no administration in time range)  fluticasone furoate-vilanterol (BREO ELLIPTA) 200-25 MCG/INH 1 puff (has no administration in time range)  brimonidine (ALPHAGAN) 0.15 % ophthalmic solution 1 drop (has no administration in time range)  cycloSPORINE (RESTASIS) 0.05 % ophthalmic emulsion 1 drop (has no administration in time range)  dorzolamide (TRUSOPT) 2 % ophthalmic solution 1 drop (has no administration in time range)  latanoprost (XALATAN) 0.005 % ophthalmic solution 1 drop (has no administration in time range)  enoxaparin (LOVENOX) injection 40 mg (has no administration in time range)  acetaminophen (TYLENOL) tablet 650 mg (has no administration in time range)    Or  acetaminophen (TYLENOL) suppository 650 mg (has no administration in time range)  ondansetron (ZOFRAN) tablet 4 mg (has no administration in time range)    Or  ondansetron (ZOFRAN) injection 4 mg (has no administration in time range)  insulin aspart (novoLOG) injection 0-9 Units (has no administration in time range)  doxycycline (VIBRA-TABS) tablet 100 mg (has no administration in time range)  ceFEPIme (MAXIPIME) 2 g in sodium chloride 0.9 % 100 mL IVPB (0 g Intravenous Stopped 04/29/20 1446)  potassium chloride SA (KLOR-CON) CR tablet 20 mEq (20 mEq Oral Given 04/29/20 1650)    ED Course  I have reviewed the triage vital signs and the nursing notes.  Pertinent labs & imaging results that were available during my care of the patient were reviewed by me and considered in my medical decision making (see chart for details).  64 yo female w/ hx of renal transplant on immunosuppressive therapy presenting to ED with tachycardia and chest pain.  Patient reports feeling "very hot" in the ED as well and has a temp of 99.37F, which in the setting of her immunosuppressives, is concerning to me for infection.   She is on her baseline O2 requirement here.    She was also tachycardic and had leukocytosis.  A sepsis protocol was initiated.  IV cefepime was ordered based on her last urine culture as empiric coverage.   Subsequently, however, UA did not show convincing evidence of infection.  A noncontrast CT chest was ordered to evaluate for pulmonary infection which showed no convincing infection.    This workup collectively is reassuring from an infectious standpoint.  However with her medical comorbidities and history, I felt that hospitalization for continued sepsis monitoring until blood cultures clear was reasonable and the safest option.    Medical chart was reviewed by myself as noted above, including most recent hospitalization and urine culture Images were personally reviewed including xray chest and CT scan ECG was personally reviewed with sinus tachycardia  Clinically I had a low suspicion for ACS or PE without new hypoxia, persistent chest pain, or ECG findings consistent with these.    Clinical Course as of Apr 29 1701  Mon Apr 29, 2020  1410 Ordered IV cefepime, hold fluids for now given hx of CHF   [MT]  1448 Signed out to Dr Roosevelt Locks hospitalist   [MT]    Clinical Course User Index [MT] Langston Masker Carola Rhine, MD    Final Clinical Impression(s) / ED Diagnoses Final diagnoses:  Sepsis, due to unspecified organism, unspecified whether acute organ dysfunction present [facility])    Rx / DC Orders ED Discharge Orders    None       Wyvonnia Dusky, MD 04/29/20 [phone]

## 2020-04-29 NOTE — ED Triage Notes (Signed)
Pt here from [facility] for eval of shob since this morning. Pt also c/o chest pain with inspiration. Facility had already placed her on 3L O2 by EMS arrival, although pt 95% on room air on arrival to ED.

## 2020-04-30 DIAGNOSIS — Z6830 Body mass index (BMI) 30.0-30.9, adult: Secondary | ICD-10-CM | POA: Diagnosis not present

## 2020-04-30 DIAGNOSIS — I471 Supraventricular tachycardia: Secondary | ICD-10-CM | POA: Diagnosis present

## 2020-04-30 DIAGNOSIS — J9811 Atelectasis: Secondary | ICD-10-CM | POA: Diagnosis present

## 2020-04-30 DIAGNOSIS — Z96642 Presence of left artificial hip joint: Secondary | ICD-10-CM | POA: Diagnosis present

## 2020-04-30 DIAGNOSIS — Z940 Kidney transplant status: Secondary | ICD-10-CM | POA: Diagnosis not present

## 2020-04-30 DIAGNOSIS — J449 Chronic obstructive pulmonary disease, unspecified: Secondary | ICD-10-CM | POA: Diagnosis present

## 2020-04-30 DIAGNOSIS — T380X5A Adverse effect of glucocorticoids and synthetic analogues, initial encounter: Secondary | ICD-10-CM | POA: Diagnosis present

## 2020-04-30 DIAGNOSIS — L89322 Pressure ulcer of left buttock, stage 2: Secondary | ICD-10-CM | POA: Diagnosis present

## 2020-04-30 DIAGNOSIS — Z8249 Family history of ischemic heart disease and other diseases of the circulatory system: Secondary | ICD-10-CM | POA: Diagnosis not present

## 2020-04-30 DIAGNOSIS — J4520 Mild intermittent asthma, uncomplicated: Secondary | ICD-10-CM

## 2020-04-30 DIAGNOSIS — E871 Hypo-osmolality and hyponatremia: Secondary | ICD-10-CM | POA: Diagnosis present

## 2020-04-30 DIAGNOSIS — E669 Obesity, unspecified: Secondary | ICD-10-CM | POA: Diagnosis present

## 2020-04-30 DIAGNOSIS — L89153 Pressure ulcer of sacral region, stage 3: Secondary | ICD-10-CM | POA: Diagnosis present

## 2020-04-30 DIAGNOSIS — J4541 Moderate persistent asthma with (acute) exacerbation: Secondary | ICD-10-CM | POA: Diagnosis not present

## 2020-04-30 DIAGNOSIS — I110 Hypertensive heart disease with heart failure: Secondary | ICD-10-CM | POA: Diagnosis present

## 2020-04-30 DIAGNOSIS — J45909 Unspecified asthma, uncomplicated: Secondary | ICD-10-CM | POA: Diagnosis present

## 2020-04-30 DIAGNOSIS — Z87440 Personal history of urinary (tract) infections: Secondary | ICD-10-CM | POA: Diagnosis not present

## 2020-04-30 DIAGNOSIS — J9621 Acute and chronic respiratory failure with hypoxia: Secondary | ICD-10-CM | POA: Diagnosis present

## 2020-04-30 DIAGNOSIS — Z9981 Dependence on supplemental oxygen: Secondary | ICD-10-CM | POA: Diagnosis not present

## 2020-04-30 DIAGNOSIS — Z20822 Contact with and (suspected) exposure to covid-19: Secondary | ICD-10-CM | POA: Diagnosis present

## 2020-04-30 DIAGNOSIS — L89150 Pressure ulcer of sacral region, unstageable: Secondary | ICD-10-CM | POA: Diagnosis present

## 2020-04-30 DIAGNOSIS — J4521 Mild intermittent asthma with (acute) exacerbation: Secondary | ICD-10-CM | POA: Diagnosis present

## 2020-04-30 DIAGNOSIS — R739 Hyperglycemia, unspecified: Secondary | ICD-10-CM | POA: Diagnosis present

## 2020-04-30 DIAGNOSIS — Z8271 Family history of polycystic kidney: Secondary | ICD-10-CM | POA: Diagnosis not present

## 2020-04-30 DIAGNOSIS — Q613 Polycystic kidney, unspecified: Secondary | ICD-10-CM | POA: Diagnosis not present

## 2020-04-30 LAB — GLUCOSE, CAPILLARY
Glucose-Capillary: 106 mg/dL — ABNORMAL HIGH (ref 70–99)
Glucose-Capillary: 193 mg/dL — ABNORMAL HIGH (ref 70–99)
Glucose-Capillary: 195 mg/dL — ABNORMAL HIGH (ref 70–99)
Glucose-Capillary: 273 mg/dL — ABNORMAL HIGH (ref 70–99)

## 2020-04-30 LAB — CBC
HCT: 28.1 % — ABNORMAL LOW (ref 36.0–46.0)
Hemoglobin: 8.5 g/dL — ABNORMAL LOW (ref 12.0–15.0)
MCH: 29.9 pg (ref 26.0–34.0)
MCHC: 30.2 g/dL (ref 30.0–36.0)
MCV: 98.9 fL (ref 80.0–100.0)
Platelets: 189 10*3/uL (ref 150–400)
RBC: 2.84 MIL/uL — ABNORMAL LOW (ref 3.87–5.11)
RDW: 23 % — ABNORMAL HIGH (ref 11.5–15.5)
WBC: 12.5 10*3/uL — ABNORMAL HIGH (ref 4.0–10.5)
nRBC: 0 % (ref 0.0–0.2)

## 2020-04-30 LAB — BASIC METABOLIC PANEL
Anion gap: 13 (ref 5–15)
BUN: 50 mg/dL — ABNORMAL HIGH (ref 8–23)
CO2: 24 mmol/L (ref 22–32)
Calcium: 9.6 mg/dL (ref 8.9–10.3)
Chloride: 98 mmol/L (ref 98–111)
Creatinine, Ser: 1.03 mg/dL — ABNORMAL HIGH (ref 0.44–1.00)
GFR calc Af Amer: >60 mL/min (ref >60)
GFR calc non Af Amer: 58 mL/min — ABNORMAL LOW (ref >60)
Glucose, Bld: 139 mg/dL — ABNORMAL HIGH (ref 70–99)
Potassium: 4.7 mmol/L (ref 3.5–5.1)
Sodium: 135 mmol/L (ref 135–145)

## 2020-04-30 LAB — URINE CULTURE: Culture: NO GROWTH

## 2020-04-30 LAB — MRSA PCR SCREENING: MRSA by PCR: NEGATIVE

## 2020-04-30 LAB — PROCALCITONIN: Procalcitonin: 0.33 ng/mL

## 2020-04-30 MED ORDER — METHYLPREDNISOLONE SODIUM SUCC 40 MG IJ SOLR
40.0000 mg | Freq: Two times a day (BID) | INTRAMUSCULAR | Status: DC
  Administered 2020-04-30 – 2020-05-01 (×3): 40 mg via INTRAVENOUS
  Filled 2020-04-30 (×3): qty 1

## 2020-04-30 MED ORDER — ENSURE ENLIVE PO LIQD
237.0000 mL | Freq: Three times a day (TID) | ORAL | Status: DC
  Administered 2020-04-30 – 2020-05-03 (×8): 237 mL via ORAL

## 2020-04-30 MED ORDER — COLLAGENASE 250 UNIT/GM EX OINT
TOPICAL_OINTMENT | Freq: Every day | CUTANEOUS | Status: DC
  Filled 2020-04-30: qty 30

## 2020-04-30 MED ORDER — JUVEN PO PACK
1.0000 | PACK | Freq: Two times a day (BID) | ORAL | Status: DC
  Administered 2020-04-30 – 2020-05-03 (×6): 1 via ORAL
  Filled 2020-04-30 (×5): qty 1

## 2020-04-30 NOTE — Plan of Care (Signed)
  Problem: Education: Goal: Knowledge of General Education information will improve Description: Including pain rating scale, medication(s)/side effects and non-pharmacologic comfort measures Outcome: Progressing   Problem: Nutrition: Goal: Adequate nutrition will be maintained Outcome: Progressing   Problem: Coping: Goal: Level of anxiety will decrease Outcome: Progressing   

## 2020-04-30 NOTE — Progress Notes (Signed)
Initial Nutrition Assessment  DOCUMENTATION CODES:   Not applicable  INTERVENTION:  -Ensure Enlive po TID, each supplement provides 350 kcal and 20 grams of protein  -1 packet Juven BID, each packet provides 95 calories, 2.5 grams of protein (collagen), and 9.8 grams of carbohydrate (3 grams sugar); also contains 7 grams of L-arginine and L-glutamine, 300 mg vitamin C, 15 mg vitamin E, 1.2 mcg vitamin B-12, 9.5 mg zinc, 200 mg calcium, and 1.5 g  Calcium Beta-hydroxy-Beta-methylbutyrate to support wound healing  -Continue 47ml Pro-stat po BID, each supplement provides 100 kcal and 15 grams of protein   NUTRITION DIAGNOSIS:   Increased nutrient needs related to wound healing as evidenced by estimated needs.    GOAL:   Patient will meet greater than or equal to 90% of their needs    MONITOR:   PO intake, Supplement acceptance, Labs, Weight trends, Skin, I & O's  REASON FOR ASSESSMENT:   Consult Assessment of nutrition requirement/status  ASSESSMENT:   Pt presented with acute asthma exacerbation. PMH includes polycystic kidney disease s/p renal transplant, COPD on 2L [place] baseline, CHF, chronic hypoxic respiratory failure, HTN, recurrent UTI   Pt states her appetite has been declining since she fell in January 2021, but her appetite has significantly declined in the last 2 weeks. Pt resides in a rehab facility and has been attempting to eat 3 meals a day, but reports eating ~25% of most meals. Pt states she likes Ensure and is agreeable to consuming these more. Pt attempting to drink Pro-stat but does not like the flavor. Discussed importance of adequate protein/calorie intake. Pt agreeable to trying to continue Pro-stat.   Pt noted to have a 7.5% weight loss x3 months, which is significant for time frame.   PO Intake: 100% x 1 recorded meal   Labs reviewed. CBGs 106-218 Medications reviewed and include: Vitamin C, Vitamin D3, Colace, Ensure Enlive BID, 75ml Pro-stat BID,  ferrous sulfate, lasix, novolog, mag-ox, MVI, deltasone, Vitamin B12  UOP: 939ml x24 hours I/O: -973ml since admit  NUTRITION - FOCUSED PHYSICAL EXAM:    Most Recent Value  Orbital Region  No depletion  Upper Arm Region  No depletion  Thoracic and Lumbar Region  No depletion  Buccal Region  No depletion  Temple Region  Mild depletion  Clavicle Bone Region  No depletion  Clavicle and Acromion Bone Region  No depletion  Scapular Bone Region  No depletion  Dorsal Hand  No depletion  Patellar Region  No depletion  Anterior Thigh Region  No depletion  Posterior Calf Region  No depletion  Edema (RD Assessment)  Mild  Hair  Reviewed  Eyes  Reviewed  Mouth  Reviewed  Skin  Reviewed  Nails  Reviewed       Diet Order:   Diet Order            Diet Heart Room service appropriate? Yes; Fluid consistency: Thin  Diet effective now              EDUCATION NEEDS:   Education needs have been addressed  Skin:  Skin Assessment: Skin Integrity Issues: Skin Integrity Issues:: Stage II, Stage III Stage II: L buttocks Stage III: coccyx  Last BM:  6/5  Height:   Ht Readings from Last 1 Encounters:  04/29/20 5\' 1"  (1.549 m)    Weight:   Wt Readings from Last 1 Encounters:  04/30/20 74.4 kg    BMI:  Body mass index is 30.99 kg/m.  Estimated Nutritional  Needs:   Kcal:  1800-1900  Protein:  90-100 grams  Fluid:  >1.8L/d    Larkin Ina, MS, RD, LDN RD pager number and weekend/on-call pager number located in [place].

## 2020-04-30 NOTE — Progress Notes (Signed)
PROGRESS NOTE    April [name]  CHE:[id number] DOB: 1955/12/06 DOA: 04/29/2020 PCP: [name], MD    Brief Narrative:  64 y.o. female with medical history significant of polycystic kidney disease s/p renal transplant (on prednisone, mycophenalate, prograf), COPD on 2L [place] baseline, diastolic CHF, chronic hypoxic resp failure, obesity, HTN, recurrent UTI, presenting to ED with SOB wheezing and palpitations.  Her symptoms started this morning, she denied any cough, but only short of breath even at rest, associated with occasional wheezing, she also had left-sided sharp chest pain on the rib cage worsened with movement.  No fever or chills, no abdominal point no diarrhea no urinary symptoms.  Patient was treated for UTI 2 weeks ago.  ED Course: Chest x-ray clear, WBC 14.1, Lactic acid 1.13>>1.1. O2 sat 99% on 2L  Assessment & Plan:   Active Problems:   Asthma, mild intermittent   Asthma   Acute asthma exacerbation -Has elevated WBC count, however no fever and no cough and no infiltrates on x-ray -on empiric doxy -CT reviewed, without infiltrate -On exam, decreased breath sounds throughout, consistent with asthma exacerbation -Have increased steroids to IV solumedrol 40mg  BID. Pt already on scheduled neb tx, will continue -Cont supportive care and wean O2 as tolerated. Pt reports being on baseline 2LNC prior to admit  Acute on Chronic hypoxic respiratory failure -Concerns for acute asthma exacerbation per above  Chest pain -Suspect chest wall pain in setting of acute asthma exacerbation -Cont with analgesics as tolerated  Hyperglycemia -No history of diabetes, probably from steroid use. -Continued on SSI coverage -A1C of 5.9  Hx of SVT -Continue verapamil as tolerated -Correct electrolytes as needed -Continued on tele  Hyponatremia -normalized -Repeat bmet in AM  History of kidney transplant -Continue with home immunosuppressive regimen -Steroids were  increased to IV solumedrol per above, would wean back to home prednisone when asthma exacerbation has resolved  Unstageable pressure injury to coccyx present on admit -WOC consulted  DVT prophylaxis: Lovenox subq Code Status: Full Family Communication: Pt in room, family not at bedside  Status is: Inpatient  Remains inpatient appropriate because:IV treatments appropriate due to intensity of illness or inability to take PO and Inpatient level of care appropriate due to severity of illness   Dispo: The patient is from: SNF              Anticipated d/c is to: SNF              Anticipated d/c date is: 2 days              Patient currently is not medically stable to d/c.  Consultants:     Procedures:     Antimicrobials: Anti-infectives (From admission, onward)   Start     Dose/Rate Route Frequency Ordered Stop   04/29/20 2200  valACYclovir (VALTREX) tablet 500 mg     500 mg Oral 2 times daily 04/29/20 1515     04/29/20 2200  doxycycline (VIBRA-TABS) tablet 100 mg     100 mg Oral Every 12 hours 04/29/20 1551     04/29/20 1415  ceFEPIme (MAXIPIME) 2 g in sodium chloride 0.9 % 100 mL IVPB     2 g 200 mL/hr over 30 Minutes Intravenous  Once 04/29/20 1404 04/29/20 1446       Subjective: Complains of increased sob and wheezing this AM  Objective: Vitals:   04/30/20 0422 04/30/20 0900 04/30/20 1000 04/30/20 1400  BP: (!) 152/63  (!) 149/70 (!) 142/61  Pulse: (!) 102 (!) 104 (!) 112 (!) 101  Resp: 20 18  16   Temp: 98.8 F (37.1 C)     TempSrc: Oral     SpO2: 100% 100%  99%  Weight:      Height:        Intake/Output Summary (Last 24 hours) at 04/30/2020 1622 Last data filed at 04/30/2020 1300 Gross per 24 hour  Intake 600 ml  Output 2115 ml  Net -1515 ml   Filed Weights   04/29/20 1247 04/30/20 0121  Weight: 70.8 kg 74.4 kg    Examination:  General exam: Appears calm and comfortable  Respiratory system: decreased BS throughout, increased resp  effort Cardiovascular system: S1 & S2 heard, Regular, tachycardic Gastrointestinal system: Abdomen is nondistended, soft and nontender. No organomegaly or masses felt. Normal bowel sounds heard. Central nervous system: Alert and oriented. No focal neurological deficits. Extremities: Symmetric 5 x 5 power. Skin: No rashes, lesions Psychiatry: Judgement and insight appear normal. Mood & affect appropriate.   Data Reviewed: I have personally reviewed following labs and imaging studies  CBC: Recent Labs  Lab 04/29/20 1343 04/30/20 0309  WBC 14.1* 12.5*  NEUTROABS 13.2*  --   HGB 8.7* 8.5*  HCT 29.0* 28.1*  MCV 101.0* 98.9  PLT 199 681   Basic Metabolic Panel: Recent Labs  Lab 04/29/20 1343 04/30/20 0309  NA 130* 135  K 3.7 4.7  CL 96* 98  CO2 23 24  GLUCOSE 289* 139*  BUN 39* 50*  CREATININE 0.89 1.03*  CALCIUM 9.0 9.6   GFR: Estimated Creatinine Clearance: 52.2 mL/min (A) (by C-G formula based on SCr of 1.03 mg/dL (H)). Liver Function Tests: Recent Labs  Lab 04/29/20 1343  AST 23  ALT 28  ALKPHOS 102  BILITOT 1.2  PROT 6.6  ALBUMIN 2.3*   No results for input(s): LIPASE, AMYLASE in the last 168 hours. No results for input(s): AMMONIA in the last 168 hours. Coagulation Profile: Recent Labs  Lab 04/29/20 1343  INR 1.2   Cardiac Enzymes: No results for input(s): CKTOTAL, CKMB, CKMBINDEX, TROPONINI in the last 168 hours. BNP (last 3 results) No results for input(s): PROBNP in the last 8760 hours. HbA1C: Recent Labs    04/29/20 1515  HGBA1C 5.9*   CBG: Recent Labs  Lab 04/29/20 1928 04/29/20 2048 04/29/20 2118 04/30/20 0635 04/30/20 1130  GLUCAP 218* 190* 176* 106* 193*   Lipid Profile: No results for input(s): CHOL, HDL, LDLCALC, TRIG, CHOLHDL, LDLDIRECT in the last 72 hours. Thyroid Function Tests: No results for input(s): TSH, T4TOTAL, FREET4, T3FREE, THYROIDAB in the last 72 hours. Anemia Panel: No results for input(s): VITAMINB12, FOLATE,  FERRITIN, TIBC, IRON, RETICCTPCT in the last 72 hours. Sepsis Labs: Recent Labs  Lab 04/29/20 1343 04/29/20 1533 04/29/20 1800 04/30/20 0309  PROCALCITON  --   --  0.23 0.33  LATICACIDVEN 1.3 1.1  --   --     Recent Results (from the past 240 hour(s))  Blood Culture (routine x 2)     Status: None (Preliminary result)   Collection Time: 04/29/20  1:43 PM   Specimen: BLOOD  Result Value Ref Range Status   Specimen Description BLOOD RIGHT ANTECUBITAL  Final   Special Requests   Final    BOTTLES DRAWN AEROBIC AND ANAEROBIC Blood Culture adequate volume   Culture   Final    NO GROWTH < 24 HOURS Performed at [facility], [place] [address] [place], [place] [address]  Report Status PENDING  Incomplete  Urine culture     Status: None   Collection Time: 04/29/20  1:50 PM   Specimen: In/Out Cath Urine  Result Value Ref Range Status   Specimen Description IN/OUT CATH URINE  Final   Special Requests NONE  Final   Culture   Final    NO GROWTH Performed at [facility], [place] [address] [place], [place] [address]    Report Status 04/30/2020 FINAL  Final  Blood Culture (routine x 2)     Status: None (Preliminary result)   Collection Time: 04/29/20  2:09 PM   Specimen: BLOOD RIGHT FOREARM  Result Value Ref Range Status   Specimen Description BLOOD RIGHT FOREARM  Final   Special Requests   Final    BOTTLES DRAWN AEROBIC AND ANAEROBIC Blood Culture adequate volume   Culture   Final    NO GROWTH < 24 HOURS Performed at [facility], [place] [address] [place], [place] [address]    Report Status PENDING  Incomplete  SARS Coronavirus 2 by RT PCR (hospital order, performed in [facility] hospital lab) Nasopharyngeal Nasopharyngeal Swab     Status: None   Collection Time: 04/29/20  3:20 PM   Specimen: Nasopharyngeal Swab  Result Value Ref Range Status   SARS Coronavirus 2 NEGATIVE NEGATIVE Final    Comment: (NOTE) SARS-CoV-2 target nucleic acids are NOT DETECTED. The  SARS-CoV-2 RNA is generally detectable in upper and lower respiratory specimens during the acute phase of infection. The lowest concentration of SARS-CoV-2 viral copies this assay can detect is 250 copies / mL. A negative result does not preclude SARS-CoV-2 infection and should not be used as the sole basis for treatment or other patient management decisions.  A negative result may occur with improper specimen collection / handling, submission of specimen other than nasopharyngeal swab, presence of viral mutation(s) within the areas targeted by this assay, and inadequate number of viral copies (<250 copies / mL). A negative result must be combined with clinical observations, patient history, and epidemiological information. Fact Sheet for Patients:   StrictlyIdeas.no Fact Sheet for Healthcare Providers: BankingDealers.co.za This test is not yet approved or cleared  by the [place] FDA and has been authorized for detection and/or diagnosis of SARS-CoV-2 by FDA under an Emergency Use Authorization (EUA).  This EUA will remain in effect (meaning this test can be used) for the duration of the COVID-19 declaration under Section 564(b)(1) of the Act, 21 U.S.C. section 360bbb-3(b)(1), unless the authorization is terminated or revoked sooner. Performed at [facility], [place] [address] [place], [place] [address]   MRSA PCR Screening     Status: None   Collection Time: 04/30/20  6:10 AM   Specimen: Nasal Mucosa; Nasopharyngeal  Result Value Ref Range Status   MRSA by PCR NEGATIVE NEGATIVE Final    Comment:        The GeneXpert MRSA Assay (FDA approved for NASAL specimens only), is one component of a comprehensive MRSA colonization surveillance program. It is not intended to diagnose MRSA infection nor to guide or monitor treatment for MRSA infections. Performed at [facility], [place] [address], [place], [place] [address]       Radiology Studies: CT Chest Wo Contrast  Result Date: 04/29/2020 CLINICAL DATA:  Pneumonia. Pleural effusion or abscess suspected. Follow-up to current chest radiograph. EXAM: CT CHEST WITHOUT CONTRAST TECHNIQUE: Multidetector CT imaging of the chest was performed following the standard protocol  without IV contrast. COMPARISON:  Current chest radiograph.  CT chest dated 04/15/2020. FINDINGS: Cardiovascular: Heart is mildly enlarged. No pericardial effusion. Minimal left coronary artery calcifications. Aorta is normal in caliber. Minor atherosclerotic calcifications along the descending portion. Main pulmonary artery is dilated to 3.8 cm. Mediastinum/Nodes: No neck base, mediastinal or hilar masses or enlarged lymph nodes. Trachea and esophagus are unremarkable. Lungs/Pleura: Small right and trace left pleural effusions. Mild dependent atelectasis noted in both lower lobes and minimally at the diaphragmatic bases of the left upper lobe lingula right middle lobe and dependent upper lobes. Minor linear atelectasis in the left upper lobe. No convincing pneumonia and no evidence of pulmonary edema. No mass or suspicious nodule. No pneumothorax. Upper Abdomen: Multiple liver and bilateral renal cysts consistent with polycystic kidney disease. Enlarged spleen, 14.5 cm greatest transverse dimension. No acute findings in the visualized upper abdomen and no change from the prior CT. Musculoskeletal: Multiple old bilateral rib fractures. No acute fractures. No osteoblastic or osteolytic lesions. IMPRESSION: 1. Small right and trace left pleural effusions. There is associated atelectasis, but no convincing pneumonia and no evidence of pulmonary edema. No abscess. The lung appearance is similar to the previous chest CT. 2. Mild cardiomegaly. 3. Enlarged main pulmonary artery suggesting pulmonary artery hypertension. 4. Stable changes polycystic kidney disease including multiple liver cysts. Stable splenomegaly. Aortic  Atherosclerosis (ICD10-I70.0). Electronically Signed   By: Lajean Manes M.D.   On: 04/29/2020 16:57   DG Chest Port 1 View  Result Date: 04/29/2020 CLINICAL DATA:  Shortness of breath EXAM: PORTABLE CHEST 1 VIEW COMPARISON:  Apr 16, 2020 FINDINGS: There is atelectatic change in the lower lung zones bilaterally, stable. No edema or airspace opacity. Heart is mildly enlarged. The pulmonary vascularity is within normal limits. No adenopathy. No bone lesions. IMPRESSION: Mild bibasilar atelectasis. No edema or airspace opacity. Heart mildly enlarged with pulmonary vascularity within normal limits. No evident adenopathy. Electronically Signed   By: Lowella Grip III M.D.   On: 04/29/2020 14:39    Scheduled Meds: . allopurinol  300 mg Oral Daily  . ascorbic acid  500 mg Oral Daily  . aspirin  81 mg Oral Daily  . brimonidine  1 drop Both Eyes BID  . budesonide  0.5 mg Nebulization BID  . cholecalciferol  1,000 Units Oral Daily  . collagenase   Topical Daily  . cycloSPORINE  1 drop Both Eyes BID  . docusate sodium  100 mg Oral BID  . dorzolamide  1 drop Both Eyes Daily  . doxycycline  100 mg Oral Q12H  . enoxaparin (LOVENOX) injection  40 mg Subcutaneous Q24H  . feeding supplement (ENSURE ENLIVE)  237 mL Oral TID BM  . feeding supplement (PRO-STAT SUGAR FREE 64)  30 mL Oral BID WC  . ferrous sulfate  325 mg Oral Q breakfast  . FLUoxetine  40 mg Oral Daily  . fluticasone furoate-vilanterol  1 puff Inhalation Daily  . furosemide  40 mg Oral Daily  . gabapentin  200 mg Oral TID  . insulin aspart  0-9 Units Subcutaneous TID WC  . ipratropium-albuterol  3 mL Nebulization BID  . latanoprost  1 drop Both Eyes QHS  . magnesium oxide  400 mg Oral BID  . methylPREDNISolone (SOLU-MEDROL) injection  40 mg Intravenous Q12H  . multivitamin with minerals  1 tablet Oral Daily  . mycophenolate  180 mg Oral BID  . nutrition supplement (JUVEN)  1 packet Oral BID BM  . pantoprazole  40  mg Oral BID  .  tacrolimus  0.5 mg Oral QHS  . tacrolimus  1 mg Oral Daily  . valACYclovir  500 mg Oral BID  . verapamil  240 mg Oral Daily  . vitamin B-12  1,000 mcg Oral Daily   Continuous Infusions:   LOS: 0 days   Marylu Lund, MD [facility] Pager On Amion  If 7PM-7AM, please contact night-coverage 04/30/2020, 4:22 PM

## 2020-04-30 NOTE — Progress Notes (Signed)
Occupational Therapy Evaluation Patient Details Name: April [name] MRN: [id number] DOB: 1956/03/24 Today's Date: 04/30/2020    History of Present Illness  April [name] is a 64 y.o. female with medical history significant of polycystic kidney disease s/p renal transplant (on prednisone, mycophenalate, prograf), COPD on 2L [place] baseline, diastolic CHF, chronic hypoxic resp failure, obesity, HTN, recurrent UTI, presenting to ED with SOB wheezing and palpitations.   Clinical Impression   Patient here from [facility].  At home patient has family support and DME, though patient has been between SNF and hospital for last couple months.  She reports that before most recent admission she was able to walk 97ft with RW and require min/mod assist with LB ADLs.  In Jan patient was able to walk with RW independently.  Today patient feeling very weak.  She required mod assist with bed mobility and heavy mod assist x2 to stand and pivot. Patient max assist with LB ADLs and set up/min assist with seated UB ADLs.  Patient would benefit from returning to SNF to continue rehab efforts for increasing independence.  Will continue to follow with OT acutely to address the deficits listed below.      Follow Up Recommendations  SNF;Supervision/Assistance - 24 hour    Equipment Recommendations  Other (comment)(defer to next venue)    Recommendations for Other Services       Precautions / Restrictions Precautions Precautions: Fall Restrictions LUE Weight Bearing: Weight bearing as tolerated LLE Weight Bearing: Weight bearing as tolerated      Mobility Bed Mobility Overal bed mobility: Needs Assistance Bed Mobility: Supine to Sit     Supine to sit: Mod assist;HOB elevated     General bed mobility comments: modA to scoot to edge of bed, assistance for LE management, and to square off hips at EOB  Transfers Overall transfer level: Needs assistance Equipment used: 2 person hand held  assist Transfers: Sit to/from [facility] Sit to Stand: Mod assist;+2 physical assistance Stand pivot transfers: Mod assist;From elevated surface       General transfer comment: Heavy mod assist of 2, trouble taking any steps    Balance Overall balance assessment: Needs assistance Sitting-balance support: No upper extremity supported;Feet supported Sitting balance-Leahy Scale: Fair     Standing balance support: Bilateral upper extremity supported Standing balance-Leahy Scale: Poor Standing balance comment: reliant on external support                           ADL either performed or assessed with clinical judgement   ADL Overall ADL's : Needs assistance/impaired Eating/Feeding: Set up;Sitting   Grooming: Set up;Sitting   Upper Body Bathing: Minimal assistance;Sitting   Lower Body Bathing: Maximal assistance;Sit to/from stand;Sitting/lateral leans   Upper Body Dressing : Minimal assistance;Sitting   Lower Body Dressing: Maximal assistance;Sit to/from stand   Toilet Transfer: Moderate assistance;+2 for physical assistance;Stand-pivot;BSC           Functional mobility during ADLs: Moderate assistance;+2 for physical assistance       Vision Baseline Vision/History: Wears glasses Wears Glasses: At all times Patient Visual Report: No change from baseline       Perception     Praxis      Pertinent Vitals/Pain Pain Assessment: Faces Faces Pain Scale: Hurts a little bit Pain Location: Tailbone Pain Descriptors / Indicators: Grimacing Pain Intervention(s): Monitored during session;Repositioned     Hand Dominance Right   Extremity/Trunk Assessment Upper Extremity  Assessment Upper Extremity Assessment: Generalized weakness(hand tremors)   Lower Extremity Assessment Lower Extremity Assessment: Generalized weakness       Communication Communication Communication: No difficulties   Cognition Arousal/Alertness:  Awake/alert Behavior During Therapy: WFL for tasks assessed/performed Overall Cognitive Status: Within Functional Limits for tasks assessed                                 General Comments: Processing speed is a little slow   General Comments  Session conducted on 2 L supplemental O2, O2 sats 98% or higher throughout; reported dizziness initially at EOB which dissipated with time, BP 149/70 in sitting    Exercises     Shoulder Instructions      Home Living Family/patient expects to be discharged to:: Skilled nursing facility                                 Additional Comments: At home she lives with family, daughter is an OT, has Press photographer      Prior Functioning/Environment Level of Independence: Needs assistance  Gait / Transfers Assistance Needed: pt working with PT at SNF, was able to ambulate 10' with RW and assistance a few days prior to admission, did have a fall at [facility] and is anxious bout falling again. Was independent with RW prior to multiple hospital admissions ADL's / Homemaking Assistance Needed: pt requires assistance for lower body ADLs   Comments: Pt at SNF for rehab due to recent hospital admissions.  Per prior notes was independent with RW prior to recent admission        OT Problem List: Decreased strength;Decreased activity tolerance;Impaired balance (sitting and/or standing);Decreased coordination;Decreased knowledge of use of DME or AE      OT Treatment/Interventions: Self-care/ADL training;Therapeutic exercise;Patient/family education;Balance training;Energy conservation;Therapeutic activities;DME and/or AE instruction    OT Goals(Current goals can be found in the care plan section) Acute Rehab OT Goals Patient Stated Goal: be strong enough to be able to go home OT Goal Formulation: With patient Time For Goal Achievement: 05/02/20 Potential to Achieve Goals: Good  OT Frequency: Min 2X/week   Barriers to D/C:             Co-evaluation PT/OT/SLP Co-Evaluation/Treatment: Yes Reason for Co-Treatment: For patient/therapist safety;To address functional/ADL transfers PT goals addressed during session: Mobility/safety with mobility OT goals addressed during session: ADL's and self-care;Proper use of Adaptive equipment and DME      AM-PAC OT "6 Clicks" Daily Activity     Outcome Measure Help from another person eating meals?: A Little Help from another person taking care of personal grooming?: A Little Help from another person toileting, which includes using toliet, bedpan, or urinal?: A Lot Help from another person bathing (including washing, rinsing, drying)?: A Lot Help from another person to put on and taking off regular upper body clothing?: A Little Help from another person to put on and taking off regular lower body clothing?: A Lot 6 Click Score: 15   End of Session Equipment Utilized During Treatment: Oxygen Nurse Communication: Mobility status  Activity Tolerance: Patient tolerated treatment well Patient left: in chair;with call bell/phone within reach;with chair alarm set  OT Visit Diagnosis: Muscle weakness (generalized) (M62.81);History of falling (Z91.81);Unsteadiness on feet (R26.81)                Time: 4315-4008 OT Time Calculation (min):  27 min Charges:  OT General Charges $OT Visit: 1 Visit OT Evaluation $OT Eval Moderate Complexity: 1 [address] OTR/L   Phylliss Bob 04/30/2020, 11:34 AM

## 2020-04-30 NOTE — Evaluation (Signed)
Physical Therapy Evaluation Patient Details Name: April [name] MRN: [id number] DOB: [birth year] Today's Date: 04/30/2020   History of Present Illness   April [name] is a 64 y.o. female with medical history significant of polycystic kidney disease s/p renal transplant (on prednisone, mycophenalate, prograf), COPD on 2L  baseline, diastolic CHF, chronic hypoxic resp failure, obesity, HTN, recurrent UTI, presenting to ED with SOB wheezing and palpitations.  Clinical Impression   Pt admitted with above diagnosis. Comes from [facility], where she was working on rehab; Reports had worked up to walking approx 10 ft with PT at [facility]; prior to initial hospitalization in January of this year, she was independent with RW, without need for supplemental O2 at baseline; Presents to PT with decr functional capacity, significant generalized weakness; Agree with return to SNF to continue her post-acute rehab and maximize independence and safety with mobility and ADLs to allow for safe dc home;  Pt currently with functional limitations due to the deficits listed below (see PT Problem List). Pt will benefit from skilled PT to increase their independence and safety with mobility to allow discharge to the venue listed below.       Follow Up Recommendations SNF    Equipment Recommendations  None recommended by PT    Recommendations for Other Services       Precautions / Restrictions Precautions Precautions: Fall Restrictions LUE Weight Bearing: Weight bearing as tolerated LLE Weight Bearing: Weight bearing as tolerated      Mobility  Bed Mobility Overal bed mobility: Needs Assistance Bed Mobility: Supine to Sit     Supine to sit: Mod assist;HOB elevated     General bed mobility comments: modA to scoot to edge of bed, assistance for LE management, and to square off hips at EOB  Transfers Overall transfer level: Needs assistance Equipment used: 2 person hand held assist Transfers:  Sit to/from [facility] Sit to Stand: Mod assist;+2 physical assistance Stand pivot transfers: Mod assist;From elevated surface       General transfer comment: Stood with 2 person assist and mod assist to power up to stand; difficulty stepping feet to get ot chair, so opted to perform basic stand pivot transfer to chair with heavy mod assist to power up and weightshift/turn  Ambulation/Gait                Stairs            Wheelchair Mobility    Modified Rankin (Stroke Patients Only)       Balance     Sitting balance-Leahy Scale: Fair       Standing balance-Leahy Scale: Poor                               Pertinent Vitals/Pain Pain Assessment: Faces Faces Pain Scale: Hurts a little bit Pain Location: Tailbone Pain Descriptors / Indicators: Grimacing Pain Intervention(s): Monitored during session;Repositioned    Home Living Family/patient expects to be discharged to:: Skilled nursing facility                 Additional Comments: At home she lives with family, daughter is an OT, has Press photographer    Prior Function Level of Independence: Needs assistance   Gait / Transfers Assistance Needed: pt working with PT at SNF, was able to ambulate 10' with RW and assistance a few days prior to admission, did have a fall at [facility] and is  anxious bout falling again. Was independent with RW prior to multiple hospital admissions  ADL's / Homemaking Assistance Needed: pt requires assistance for lower body ADLs  Comments: Pt at SNF for rehab due to recent hospital admissions.  Per prior notes was independent with RW prior to recent admission     Hand Dominance   Dominant Hand: Right    Extremity/Trunk Assessment   Upper Extremity Assessment Upper Extremity Assessment: Defer to OT evaluation    Lower Extremity Assessment Lower Extremity Assessment: Generalized weakness       Communication   Communication: No difficulties   Cognition Arousal/Alertness: Awake/alert Behavior During Therapy: WFL for tasks assessed/performed Overall Cognitive Status: Within Functional Limits for tasks assessed(for simple mobility tasks)                                        General Comments General comments (skin integrity, edema, etc.): Session conducted on 2 L supplemental O2, O2 sats 98% or higher throughout; reported dizziness initially at EOB which dissipated with time, BP 149/70 in sitting    Exercises     Assessment/Plan    PT Assessment Patient needs continued PT services  PT Problem List Decreased strength;Decreased activity tolerance;Decreased balance;Decreased mobility;Decreased cognition;Decreased knowledge of use of DME;Decreased safety awareness;Decreased knowledge of precautions;Cardiopulmonary status limiting activity;Pain;Impaired sensation       PT Treatment Interventions DME instruction;Gait training;Functional mobility training;Therapeutic activities;Therapeutic exercise;Balance training;Neuromuscular re-education;Cognitive remediation;Patient/family education    PT Goals (Current goals can be found in the Care Plan section)  Acute Rehab PT Goals Patient Stated Goal: be strong enough to be able to go home PT Goal Formulation: With patient Time For Goal Achievement: 05/14/20 Potential to Achieve Goals: Fair    Frequency Min 2X/week   Barriers to discharge        Co-evaluation PT/OT/SLP Co-Evaluation/Treatment: Yes Reason for Co-Treatment: For patient/therapist safety;To address functional/ADL transfers PT goals addressed during session: Mobility/safety with mobility         AM-PAC PT "6 Clicks" Mobility  Outcome Measure Help needed turning from your back to your side while in a flat bed without using bedrails?: A Little Help needed moving from lying on your back to sitting on the side of a flat bed without using bedrails?: A Lot Help needed moving to and from a bed to a  chair (including a wheelchair)?: A Lot Help needed standing up from a chair using your arms (e.g., wheelchair or bedside chair)?: A Lot Help needed to walk in hospital room?: Total Help needed climbing 3-5 steps with a railing? : Total 6 Click Score: 11    End of Session Equipment Utilized During Treatment: Gait belt Activity Tolerance: Patient limited by fatigue Patient left: in chair;with call bell/phone within reach Nurse Communication: Mobility status;Other (comment)(Stedy for back to bed) PT Visit Diagnosis: Other abnormalities of gait and mobility (R26.89);Repeated falls (R29.6);Muscle weakness (generalized) (M62.81)    Time: 6270-3500 PT Time Calculation (min) (ACUTE ONLY): 27 min   Charges:   PT Evaluation $PT Eval Moderate Complexity: 1 Mod          Roney [place], [place]  Acute Rehabilitation Services Pager [phone] Office [phone]   Colletta [place] 04/30/2020, 11:09 AM

## 2020-04-30 NOTE — Consult Note (Signed)
WOC Nurse Consult Note: Reason for Consult:Unstageable pressure injury to coccyx.  Incontinent of urine  Calls for bedpan at SNF,but doesn't always get it on time.  Exposed to urine once daily on average.  Bed immobility causes prolonged exposure to pressure.  Wound type:pressure and moisture from incontinence Pressure Injury POA: Yes  present since April Measurement: 2.2 cm x 1.5 cm with fibrin to wound bed. Purewick in place  Wound bed:100% slough Drainage (amount, consistency, odor) minimal serosanguinous  No odor.  Periwound:intact  Frequently moist Dressing procedure/placement/frequency:Cleanse coccyx wound with NS and pat dry.Apply Santyl to wound bed. Cover with NS moist 2x2 and silicone sacral foam. Change santyl daily and foam dressing every three days and PRN soilage.  Will not follow at this time.  Please re-consult if needed.  Domenic Moras MSN, RN, FNP-BC CWON Wound, Ostomy, Continence Nurse Pager [phone]

## 2020-05-01 DIAGNOSIS — L89150 Pressure ulcer of sacral region, unstageable: Secondary | ICD-10-CM

## 2020-05-01 LAB — COMPREHENSIVE METABOLIC PANEL
ALT: 26 U/L (ref 0–44)
AST: 17 U/L (ref 15–41)
Albumin: 2.3 g/dL — ABNORMAL LOW (ref 3.5–5.0)
Alkaline Phosphatase: 100 U/L (ref 38–126)
Anion gap: 9 (ref 5–15)
BUN: 65 mg/dL — ABNORMAL HIGH (ref 8–23)
CO2: 25 mmol/L (ref 22–32)
Calcium: 10 mg/dL (ref 8.9–10.3)
Chloride: 100 mmol/L (ref 98–111)
Creatinine, Ser: 1.01 mg/dL — ABNORMAL HIGH (ref 0.44–1.00)
GFR calc Af Amer: >60 mL/min (ref >60)
GFR calc non Af Amer: 60 mL/min — ABNORMAL LOW (ref >60)
Glucose, Bld: 243 mg/dL — ABNORMAL HIGH (ref 70–99)
Potassium: 5.1 mmol/L (ref 3.5–5.1)
Sodium: 134 mmol/L — ABNORMAL LOW (ref 135–145)
Total Bilirubin: 0.4 mg/dL (ref 0.3–1.2)
Total Protein: 6.9 g/dL (ref 6.5–8.1)

## 2020-05-01 LAB — CBC
HCT: 28 % — ABNORMAL LOW (ref 36.0–46.0)
Hemoglobin: 8.4 g/dL — ABNORMAL LOW (ref 12.0–15.0)
MCH: 29.7 pg (ref 26.0–34.0)
MCHC: 30 g/dL (ref 30.0–36.0)
MCV: 98.9 fL (ref 80.0–100.0)
Platelets: 190 10*3/uL (ref 150–400)
RBC: 2.83 MIL/uL — ABNORMAL LOW (ref 3.87–5.11)
RDW: 21.9 % — ABNORMAL HIGH (ref 11.5–15.5)
WBC: 8.1 10*3/uL (ref 4.0–10.5)
nRBC: 0 % (ref 0.0–0.2)

## 2020-05-01 LAB — GLUCOSE, CAPILLARY
Glucose-Capillary: 206 mg/dL — ABNORMAL HIGH (ref 70–99)
Glucose-Capillary: 343 mg/dL — ABNORMAL HIGH (ref 70–99)
Glucose-Capillary: 214 mg/dL — ABNORMAL HIGH (ref 70–99)
Glucose-Capillary: 307 mg/dL — ABNORMAL HIGH (ref 70–99)

## 2020-05-01 LAB — PROCALCITONIN: Procalcitonin: 0.17 ng/mL

## 2020-05-01 MED ORDER — INSULIN ASPART 100 UNIT/ML ~~LOC~~ SOLN
3.0000 [IU] | Freq: Once | SUBCUTANEOUS | Status: AC
  Administered 2020-05-02: 3 [IU] via SUBCUTANEOUS

## 2020-05-01 MED ORDER — INSULIN GLARGINE 100 UNIT/ML ~~LOC~~ SOLN
15.0000 [IU] | Freq: Every day | SUBCUTANEOUS | Status: DC
  Administered 2020-05-01: 15 [IU] via SUBCUTANEOUS
  Filled 2020-05-01 (×2): qty 0.15

## 2020-05-01 MED ORDER — PREDNISONE 20 MG PO TABS
20.0000 mg | ORAL_TABLET | Freq: Two times a day (BID) | ORAL | Status: DC
  Administered 2020-05-02 – 2020-05-03 (×3): 20 mg via ORAL
  Filled 2020-05-01 (×3): qty 1

## 2020-05-01 NOTE — TOC Initial Note (Addendum)
Transition of Care [facility]) - Initial/Assessment Note    Patient Details  Name: April [name] MRN: [id number] Date of Birth: Aug 27, 1956  [facility] CM/SW Contact:    Sable Feil, LCSW Phone Number: 05/01/2020, 11:58 AM  Clinical Narrative: CSW talked with patient at bedside regarding her discharge disposition. [name] was sitting up in bed and another staff person was at the bedside preparing her medications. Patient advised that she is ready for discharge. [name] confirmed that she came to hospital from [facility] and plans to return there at discharge. When asked, [name] gave permission for her daughter [name] to be contacted.    *Patient will discharge back to [facility] once insurance authorization received.  Clinicals faxed to [employer] today.           Expected Discharge Plan: Skilled Nursing Facility(From [facility]) Barriers to Discharge: Insurance Authorization(Clinicals faxed to [employer] 10:35 am)   Patient Goals and CMS Choice Patient states their goals for this hospitalization and ongoing recovery are:: Patient confirmed her intent to return to [facility] to continue her rehab CMS Medicare.gov Compare Post Acute Care list provided to:: Other (Comment Required)(Medicare.gov SNF list not needed as patient returning to [facility]) Choice offered to / list presented to : NA  Expected Discharge Plan and Services Expected Discharge Plan: Skilled Nursing Facility(From [facility]) In-house Referral: Clinical Social Work   Post Acute Care Choice: [place] Living arrangements for the past 2 months: [place]                                      Prior Living Arrangements/Services Living arrangements for the past 2 months: [place] Lives with:: Facility Resident(Paatient will return home after d/c from [place]) Patient language and need for interpreter reviewed:: No Do  you feel safe going back to the place where you live?: No(Patient's plan is to return to [facility] to continue rehab)      Need for Family Participation in Patient Care: No (Comment) Care giver support system in place?: Yes (comment)   Criminal Activity/Legal Involvement Pertinent to Current Situation/Hospitalization: No - Comment as needed  Activities of Daily Living   ADL Screening (condition at time of admission) Patient's cognitive ability adequate to safely complete daily activities?: Yes Is the patient deaf or have difficulty hearing?: No Does the patient have difficulty seeing, even when wearing glasses/contacts?: No Does the patient have difficulty concentrating, remembering, or making decisions?: No Patient able to express need for assistance with ADLs?: Yes Does the patient have difficulty dressing or bathing?: Yes Independently performs ADLs?: No Communication: Independent Dressing (OT): Needs assistance Is this a change from baseline?: Pre-admission baseline Grooming: Needs assistance Is this a change from baseline?: Pre-admission baseline Feeding: Independent Bathing: Needs assistance Is this a change from baseline?: Pre-admission baseline Toileting: Needs assistance Is this a change from baseline?: Pre-admission baseline In/Out Bed: Needs assistance Is this a change from baseline?: Pre-admission baseline Walks in Home: Needs assistance Is this a change from baseline?: Pre-admission baseline Does the patient have difficulty walking or climbing stairs?: Yes Weakness of Legs: Both Weakness of Arms/Hands: None  Permission Sought/Granted      Share Information with NAME: Arneshia Ade     Permission granted to share info w Relationship: Daughter  Permission granted to share info w Contact Information: [phone]  Emotional Assessment Appearance::  Appears older than stated age Attitude/Demeanor/Rapport: Engaged Affect (typically observed): Accepting,  Appropriate Orientation: : Oriented to Self, Oriented to Place, Oriented to  Time, Oriented to Situation Alcohol / Substance Use: Tobacco Use, Alcohol Use, Illicit Drugs(Patient reported that she quit smoking and does not drink alcohol or use illicit drugs) Psych Involvement: No (comment)  Admission diagnosis:  Asthma [J45.909] Sepsis, due to unspecified organism, unspecified whether acute organ dysfunction present [facility]) [A41.9] Patient Active Problem List   Diagnosis Date Noted  . Asthma 04/29/2020  . Sepsis due to gram-negative UTI ([place]) 04/16/2020  . Pericardial effusion 04/16/2020  . Chronic respiratory failure with hypoxia ([place]) 04/16/2020  . Proteinuria 04/16/2020  . Acute respiratory distress 04/16/2020  . Pressure injury of skin 04/08/2020  . COPD exacerbation ([place]) 04/07/2020  . Acute on chronic respiratory failure with hypoxia ([place]) 03/14/2020  . PNA (pneumonia) 03/13/2020  . SVT (supraventricular tachycardia) ([place])   . Anemia of chronic disease   . Palliative care encounter   . Goals of care, counseling/discussion   . Essential hypertension 02/08/2020  . Glaucoma 02/08/2020  . Sepsis ([place]) 01/31/2020  . Hypoxia   . Shortness of breath   . Fracture of left superior pubic ramus (H[facility])   . Pain   . Anxiety state   . Left displaced femoral neck fracture ([place]) 12/15/2019  . Status post total hip replacement, left   . Post-op pain   . Acute blood loss anemia   . Polycystic kidney   . Closed left hip fracture ([place]) 12/07/2019  . Left wrist fracture 12/07/2019  . Medication management 09/22/2019  . Physical deconditioning 09/22/2019  . Hyponatremia 06/29/2019  . Acute respiratory failure with hypoxia ([place]) 04/19/2019  . Acute on chronic diastolic CHF (congestive heart failure) ([place]) 04/19/2019  . Bilateral closed proximal tibial fracture 12/21/2018  . Closed right ankle fracture 12/21/2018  . Fracture 12/21/2018  . Lower urinary tract infectious disease 10/03/2018  .  Asthma, chronic, unspecified asthma severity, with acute exacerbation 10/03/2018  . SIRS (systemic inflammatory response syndrome) ([place]) 10/03/2018  . Nausea vomiting and diarrhea 10/02/2018  . Chronic diastolic CHF (congestive heart failure) ([place]) 10/01/2017  . Influenza B 10/01/2017  . Persistent asthma with status asthmaticus   . Pneumonia 07/15/2016  . Asthma, mild intermittent 07/15/2016  . GERD without esophagitis 07/15/2016  . Renal transplant recipient 07/15/2016  . Hyperlipidemia 07/15/2016  . Depression 07/15/2016  . Bronchitis, mucopurulent recurrent ([place]) 08/08/2014  . Chronic cough 07/24/2014  . End stage renal disease ([place]) 03/23/2012  . 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, implant, and graft 03/23/2012   PCP:  [name], MD Pharmacy:  No Pharmacies Listed    Social Determinants of Health (SDOH) Interventions  No SDOH interventions requested or needed at this time  Readmission Risk Interventions Readmission Risk Prevention Plan 04/19/2020 02/09/2020 04/20/2019  Transportation Screening Complete Complete Complete  PCP or Specialist Appt within 3-5 Days - - Not Complete  Not Complete comments - - not yet ready for d/c  HRI or [place] - - Not Complete  HRI or Home Care Consult comments - - no needs identified  Social Work Consult for [place] Planning/Counseling - - Not Complete  SW consult not completed comments - - no needs identified  Palliative Care Screening - - Not Applicable  Medication Review (RN Care Manager) Complete Referral to Pharmacy Complete  PCP or Specialist appointment within 3-5 days of discharge Complete Complete -  [place] or Home Care Consult Complete  Complete -  SW Recovery Care/Counseling Consult Complete - -  Palliative Care Screening Not Applicable Complete -  [place] Complete Patient Refused -  Some recent data might be hidden

## 2020-05-01 NOTE — NC FL2 (Signed)
[place] LEVEL OF CARE SCREENING TOOL     IDENTIFICATION  Patient Name: April [name] Birthdate: [birth year] Sex: female Admission Date (Current Location): 04/29/2020  [facility] [place] Number:  Herbalist and Address:  [facility], [place] [address], [place], [place] [address]      Provider Number: [id number]  Attending Physician Name and Address:  Darliss Cheney, MD  Relative Name and Phone Number:  Yesly Gerety - daughter; [phone]    Current Level of Care: Hospital Recommended Level of Care: Skilled Nursing Facility([facility]) Prior Approval Number:    Date Approved/Denied:   PASRR Number: [id number]  Discharge Plan: SNF    Current Diagnoses: Patient Active Problem List   Diagnosis Date Noted  . Asthma 04/29/2020  . Sepsis due to gram-negative UTI ([place]) 04/16/2020  . Pericardial effusion 04/16/2020  . Chronic respiratory failure with hypoxia ([place]) 04/16/2020  . Proteinuria 04/16/2020  . Acute respiratory distress 04/16/2020  . Pressure injury of skin 04/08/2020  . COPD exacerbation ([place]) 04/07/2020  . Acute on chronic respiratory failure with hypoxia ([place]) 03/14/2020  . PNA (pneumonia) 03/13/2020  . SVT (supraventricular tachycardia) ([place])   . Anemia of chronic disease   . Palliative care encounter   . Goals of care, counseling/discussion   . Essential hypertension 02/08/2020  . Glaucoma 02/08/2020  . Sepsis ([place]) 01/31/2020  . Hypoxia   . Shortness of breath   . Fracture of left superior pubic ramus (H[facility])   . Pain   . Anxiety state   . Left displaced femoral neck fracture ([place]) 12/15/2019  . Status post total hip replacement, left   . Post-op pain   . Acute blood loss anemia   . Polycystic kidney   . Closed left hip fracture ([place]) 12/07/2019  . Left wrist fracture 12/07/2019  . Medication management 09/22/2019  . Physical deconditioning 09/22/2019  . Hyponatremia 06/29/2019  . Acute  respiratory failure with hypoxia ([place]) 04/19/2019  . Acute on chronic diastolic CHF (congestive heart failure) ([place]) 04/19/2019  . Bilateral closed proximal tibial fracture 12/21/2018  . Closed right ankle fracture 12/21/2018  . Fracture 12/21/2018  . Lower urinary tract infectious disease 10/03/2018  . Asthma, chronic, unspecified asthma severity, with acute exacerbation 10/03/2018  . SIRS (systemic inflammatory response syndrome) ([place]) 10/03/2018  . Nausea vomiting and diarrhea 10/02/2018  . Chronic diastolic CHF (congestive heart failure) ([place]) 10/01/2017  . Influenza B 10/01/2017  . Persistent asthma with status asthmaticus   . Pneumonia 07/15/2016  . Asthma, mild intermittent 07/15/2016  . GERD without esophagitis 07/15/2016  . Renal transplant recipient 07/15/2016  . Hyperlipidemia 07/15/2016  . Depression 07/15/2016  . Bronchitis, mucopurulent recurrent ([place]) 08/08/2014  . Chronic cough 07/24/2014  . End stage renal disease ([place]) 03/23/2012  . 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, implant, and graft 03/23/2012    Orientation RESPIRATION BLADDER Height & Weight     Self, Time, Situation, Place  O2(2 Liters Oxygen) Continent  External Urinary Catheter placed 04/14/20 Weight: 164 lb 0.4 oz (74.4 kg) Height:  5\' 1"  (154.9 cm)  BEHAVIORAL SYMPTOMS/MOOD NEUROLOGICAL BOWEL NUTRITION STATUS      Continent Diet(Heart healthy)  AMBULATORY STATUS COMMUNICATION OF NEEDS Skin   Total Care(Patient was unable to ambulate with PT during evaluation on 6/8)   Other (Comment)(Pressure injury stage 3 mid coocyx with foam dressing - Cleanse coccyx wound with NS and pat dry.Apply Santyl to wound bed. Cover with NS moist 2x2  and silicone sacral foam. Change santyl daily and foam dressing every three days and PRN soilage)  Ecchymosis bilateral arm/leg  Skin Tear left hand with gauze                   Personal Care Assistance Level of Assistance  Bathing, Feeding, Dressing  Bathing Assistance: Maximum assistance(Upper body min assist; Lower body max assist) Feeding assistance: Limited assistance Dressing Assistance: Maximum assistance(Upper body min assist; Lower body mod assist)     Functional Limitations Info  Sight, Hearing, Speech Sight Info: Impaired(Wears glasses) Hearing Info: Adequate Speech Info: Adequate    SPECIAL CARE FACTORS FREQUENCY  PT (By licensed PT), OT (By licensed OT)     PT Frequency: PT evaluation 6/8. PT at SNF eval and Treat, a minimum of 5 days per week OT Frequency: OT eval 6/8. OT at SNF eval and Treat, a minimum of 5 days per week            Contractures Contractures Info: Not present    Additional Factors Info  Code Status, Allergies Code Status Info: Full Allergies Info: Infed-Iron Dextran, Pentamidine, Erythromycin-Erythromycin, Iohexol, Oxycodone, Erythromycin, Ultram - Tramadol Hcl           Current Medications (05/01/2020):  This is the current hospital active medication list Current Facility-Administered Medications  Medication Dose Route Frequency Provider Last Rate Last Admin  . acetaminophen (TYLENOL) tablet 650 mg  650 mg Oral Q6H PRN Wynetta Fines T, MD       Or  . acetaminophen (TYLENOL) suppository 650 mg  650 mg Rectal Q6H PRN Wynetta Fines T, MD      . albuterol (PROVENTIL) (2.5 MG/3ML) 0.083% nebulizer solution 3 mL  3 mL Inhalation Q4H PRN Wynetta Fines T, MD      . allopurinol (ZYLOPRIM) tablet 300 mg  300 mg Oral Daily Wynetta Fines T, MD   300 mg at 05/01/20 1056  . ascorbic acid (VITAMIN C) tablet 500 mg  500 mg Oral Daily Wynetta Fines T, MD   500 mg at 05/01/20 1056  . aspirin chewable tablet 81 mg  81 mg Oral Daily Wynetta Fines T, MD   81 mg at 05/01/20 1056  . brimonidine (ALPHAGAN) 0.15 % ophthalmic solution 1 drop  1 drop Both Eyes BID Wynetta Fines T, MD   1 drop at 05/01/20 1100  . budesonide (PULMICORT) nebulizer solution 0.5 mg  0.5 mg Nebulization BID Wynetta Fines T, MD   0.5 mg at 05/01/20 0800   . cholecalciferol (VITAMIN D3) tablet 1,000 Units  1,000 Units Oral Daily Lequita Halt, MD   1,000 Units at 05/01/20 1057  . collagenase (SANTYL) ointment   Topical Daily Donne Hazel, MD   Given at 04/30/20 1359  . cycloSPORINE (RESTASIS) 0.05 % ophthalmic emulsion 1 drop  1 drop Both Eyes BID Wynetta Fines T, MD   1 drop at 05/01/20 1057  . dextromethorphan-guaiFENesin (MUCINEX DM) 30-600 MG per 12 hr tablet 1 tablet  1 tablet Oral BID PRN Wynetta Fines T, MD      . docusate sodium (COLACE) capsule 100 mg  100 mg Oral BID Wynetta Fines T, MD   100 mg at 05/01/20 1059  . dorzolamide (TRUSOPT) 2 % ophthalmic solution 1 drop  1 drop Both Eyes Daily Wynetta Fines T, MD   1 drop at 05/01/20 1100  . doxycycline (VIBRA-TABS) tablet 100 mg  100 mg Oral Q12H Wynetta Fines T, MD   100 mg at 05/01/20  1055  . enoxaparin (LOVENOX) injection 40 mg  40 mg Subcutaneous Q24H Wynetta Fines T, MD   40 mg at 04/30/20 2143  . feeding supplement (ENSURE ENLIVE) (ENSURE ENLIVE) liquid 237 mL  237 mL Oral TID BM Wynetta Fines T, MD   237 mL at 05/01/20 1101  . feeding supplement (PRO-STAT SUGAR FREE 64) liquid 30 mL  30 mL Oral BID WC Wynetta Fines T, MD   30 mL at 05/01/20 1101  . ferrous sulfate tablet 325 mg  325 mg Oral Q breakfast Wynetta Fines T, MD   325 mg at 05/01/20 1059  . FLUoxetine (PROZAC) capsule 40 mg  40 mg Oral Daily Wynetta Fines T, MD   40 mg at 05/01/20 1100  . fluticasone furoate-vilanterol (BREO ELLIPTA) 200-25 MCG/INH 1 puff  1 puff Inhalation Daily Wynetta Fines T, MD   1 puff at 05/01/20 0800  . furosemide (LASIX) tablet 40 mg  40 mg Oral Daily Wynetta Fines T, MD   40 mg at 05/01/20 1059  . gabapentin (NEURONTIN) capsule 200 mg  200 mg Oral TID Wynetta Fines T, MD   200 mg at 05/01/20 1055  . hydrOXYzine (ATARAX/VISTARIL) tablet 10 mg  10 mg Oral BID PRN Wynetta Fines T, MD      . insulin aspart (novoLOG) injection 0-9 Units  0-9 Units Subcutaneous TID WC Lequita Halt, MD   2 Units at 04/30/20 1740  .  ipratropium-albuterol (DUONEB) 0.5-2.5 (3) MG/3ML nebulizer solution 3 mL  3 mL Nebulization BID Wynetta Fines T, MD   3 mL at 05/01/20 0800  . latanoprost (XALATAN) 0.005 % ophthalmic solution 1 drop  1 drop Both Eyes QHS Lequita Halt, MD   1 drop at 04/30/20 2145  . magnesium oxide (MAG-OX) tablet 400 mg  400 mg Oral BID Wynetta Fines T, MD   400 mg at 05/01/20 1056  . methylPREDNISolone sodium succinate (SOLU-MEDROL) 40 mg/mL injection 40 mg  40 mg Intravenous Q12H Donne Hazel, MD   40 mg at 05/01/20 0035  . multivitamin with minerals tablet 1 tablet  1 tablet Oral Daily Lequita Halt, MD   1 tablet at 05/01/20 1057  . mycophenolate (MYFORTIC) EC tablet 180 mg  180 mg Oral BID Wynetta Fines T, MD   180 mg at 05/01/20 1056  . nutrition supplement (JUVEN) (JUVEN) powder packet 1 packet  1 packet Oral BID BM Lequita Halt, MD   1 packet at 05/01/20 1100  . ondansetron (ZOFRAN) tablet 4 mg  4 mg Oral Q6H PRN Wynetta Fines T, MD       Or  . ondansetron [facility]) injection 4 mg  4 mg Intravenous Q6H PRN Wynetta Fines T, MD      . pantoprazole (PROTONIX) EC tablet 40 mg  40 mg Oral BID Wynetta Fines T, MD   40 mg at 05/01/20 1057  . tacrolimus (PROGRAF) capsule 0.5 mg  0.5 mg Oral QHS Wynetta Fines T, MD   0.5 mg at 04/30/20 2144  . tacrolimus (PROGRAF) capsule 1 mg  1 mg Oral Daily Wynetta Fines T, MD   1 mg at 05/01/20 1058  . valACYclovir (VALTREX) tablet 500 mg  500 mg Oral BID Wynetta Fines T, MD   500 mg at 05/01/20 1058  . verapamil (CALAN-SR) CR tablet 240 mg  240 mg Oral Daily Wynetta Fines T, MD   240 mg at 05/01/20 1101  . vitamin B-12 (CYANOCOBALAMIN) tablet 1,000 mcg  1,000 mcg Oral  Daily Lequita Halt, MD   1,000 mcg at 05/01/20 1056     Discharge Medications: Please see discharge summary for a list of discharge medications.  Relevant Imaging Results:  Relevant Lab Results:   Additional Information [id number]. Negative COVID  Sable Feil, LCSW

## 2020-05-01 NOTE — Progress Notes (Signed)
Patient's CBG=307,text paged NP on call. Order received in epic. Will continue to monitor.

## 2020-05-01 NOTE — Progress Notes (Signed)
Inpatient Diabetes Program Recommendations  AACE/ADA: New Consensus Statement on Inpatient Glycemic Control (2015)  Target Ranges:  Prepandial:   less than 140 mg/dL      Peak postprandial:   less than 180 mg/dL (1-2 hours)      Critically ill patients:  140 - 180 mg/dL   Lab Results  Component Value Date   GLUCAP 343 (H) 05/01/2020   HGBA1C 5.9 (H) 04/29/2020    Review of Glycemic Control Results for April [name], April [name] (MRN [id number]) as of 05/01/2020 11:43  Ref. Range 04/30/2020 16:56 04/30/2020 22:06 05/01/2020 06:39 05/01/2020 11:42  Glucose-Capillary Latest Ref Range: 70 - 99 mg/dL 195 (H) 273 (H) 206 (H) 343 (H)   Diabetes history: no hx Outpatient Diabetes medications: none Current orders for Inpatient glycemic control: Novolog 0-9 units TID Solumedrol 40 mg BID  Inpatient Diabetes Program Recommendations:    In the setting of steroids, if post prandials >180 mg/dL consider:  -Adding Novolog 3 units TID (assming patient is consuming >50% of meal).   Thanks, Bronson Curb, MSN, RNC-OB Diabetes Coordinator [phone] (8a-5p)

## 2020-05-01 NOTE — Progress Notes (Signed)
PROGRESS NOTE    April [name]  YYT:[id number] DOB: 1956/08/14 DOA: 04/29/2020 PCP: [name], MD    Brief Narrative:  64 y.o. female with medical history significant of polycystic kidney disease s/p renal transplant (on prednisone, mycophenalate, prograf), COPD on 2L [place] baseline, diastolic CHF, chronic hypoxic resp failure, obesity, HTN, recurrent UTI, presenting to ED with SOB wheezing and palpitations.  Her symptoms started this morning, she denied any cough, but only short of breath even at rest, associated with occasional wheezing, she also had left-sided sharp chest pain on the rib cage worsened with movement.  No fever or chills, no abdominal point no diarrhea no urinary symptoms.  Patient was treated for UTI 2 weeks ago.  ED Course: Chest x-ray clear, WBC 14.1, Lactic acid 1.13>>1.1. O2 sat 99% on 2L  Assessment & Plan:   Active Problems:   Asthma, mild intermittent   Asthma   Decubitus ulcer of coccyx, unstageable (HCC)   Acute on chronic hypoxic respiratory failure secondary to acute asthma exacerbation Now feeling much better and oxygen requirement is 2 L which is her baseline.  Continue doxycycline, Pulmicort, albuterol and downgrade and discontinue Solu-Medrol and start on prednisone.  Chest pain -Suspect chest wall pain in setting of acute asthma exacerbation -Cont with analgesics as tolerated.  She has no chest pain.  Hyperglycemia/prediabetes -No history of diabetes.  Blood sugar significantly elevated likely due to being on steroid.  Will start on Lantus 15 units and continue SSI.  Hx of SVT -Continue verapamil as tolerated -Correct electrolytes as needed -Continued on tele  Hyponatremia -normalized -Repeat bmet in AM  History of kidney transplant -Continue with home immunosuppressive regimen  Unstageable pressure injury to coccyx present on admit -WOC consulted Pressure Injury 04/07/20 Buttocks Left Stage 2 -  Partial thickness loss of  dermis presenting as a shallow open injury with a red, pink wound bed without slough. (Active)  04/07/20 1730  Location: Buttocks  Location Orientation: Left  Staging: Stage 2 -  Partial thickness loss of dermis presenting as a shallow open injury with a red, pink wound bed without slough.  Wound Description (Comments):   Present on Admission: Yes     Pressure Injury 04/29/20 Coccyx Mid Stage 3 -  Full thickness tissue loss. Subcutaneous fat may be visible but bone, tendon or muscle are NOT exposed. (Active)  04/29/20 2130  Location: Coccyx  Location Orientation: Mid  Staging: Stage 3 -  Full thickness tissue loss. Subcutaneous fat may be visible but bone, tendon or muscle are NOT exposed.  Wound Description (Comments):   Present on Admission: Yes     DVT prophylaxis: Lovenox subq Code Status: Full Family Communication: No family present at bedside.  Discussed with patient herself.  Status is: Inpatient  Remains inpatient appropriate because:IV treatments appropriate due to intensity of illness or inability to take PO and Inpatient level of care appropriate due to severity of illness   Dispo: The patient is from: SNF              Anticipated d/c is to: SNF              Anticipated d/c date is: 1 to 2 days, waiting for insurance authorization              Patient currently medically stable for DC  Consultants:   None  Procedures:   None  Antimicrobials: Anti-infectives (From admission, onward)   Start     Dose/Rate Route Frequency Ordered Stop  04/29/20 2200  valACYclovir (VALTREX) tablet 500 mg     500 mg Oral 2 times daily 04/29/20 1515     04/29/20 2200  doxycycline (VIBRA-TABS) tablet 100 mg     100 mg Oral Every 12 hours 04/29/20 1551     04/29/20 1415  ceFEPIme (MAXIPIME) 2 g in sodium chloride 0.9 % 100 mL IVPB     2 g 200 mL/hr over 30 Minutes Intravenous  Once 04/29/20 1404 04/29/20 1446      Subjective: Seen and examined this morning.  Feels much  better.  Feels like her breathing is back to her baseline.  No new complaint.  Objective: Vitals:   04/30/20 2206 05/01/20 0445 05/01/20 0800 05/01/20 0937  BP: 136/63 (!) 145/83  (!) 123/57  Pulse: 97 83  83  Resp: 18 18  18   Temp: 97.7 F (36.5 C) (!) 97.4 F (36.3 C)  98 F (36.7 C)  TempSrc: Oral Oral  Oral  SpO2: 99% 94% 100% 99%  Weight:      Height:        Intake/Output Summary (Last 24 hours) at 05/01/2020 1542 Last data filed at 05/01/2020 1300 Gross per 24 hour  Intake 360 ml  Output 2375 ml  Net -2015 ml   Filed Weights   04/29/20 1247 04/30/20 0121  Weight: 70.8 kg 74.4 kg    Examination:  General exam: Appears calm and comfortable  Respiratory system: Diminished breath sounds bilaterally. Respiratory effort normal.  No wheezes or crackles. Cardiovascular system: S1 & S2 heard, RRR. No JVD, murmurs, rubs, gallops or clicks. No pedal edema. Gastrointestinal system: Abdomen is nondistended, soft and nontender. No organomegaly or masses felt. Normal bowel sounds heard. Central nervous system: Alert and oriented. No focal neurological deficits. Extremities: Symmetric 5 x 5 power. Skin: No rashes, lesions or ulcers.  Psychiatry: Judgement and insight appear normal. Mood & affect appropriate.   Data Reviewed: I have personally reviewed following labs and imaging studies  CBC: Recent Labs  Lab 04/29/20 1343 04/30/20 0309 05/01/20 0354  WBC 14.1* 12.5* 8.1  NEUTROABS 13.2*  --   --   HGB 8.7* 8.5* 8.4*  HCT 29.0* 28.1* 28.0*  MCV 101.0* 98.9 98.9  PLT 199 189 675   Basic Metabolic Panel: Recent Labs  Lab 04/29/20 1343 04/30/20 0309 05/01/20 0354  NA 130* 135 134*  K 3.7 4.7 5.1  CL 96* 98 100  CO2 23 24 25   GLUCOSE 289* 139* 243*  BUN 39* 50* 65*  CREATININE 0.89 1.03* 1.01*  CALCIUM 9.0 9.6 10.0   GFR: Estimated Creatinine Clearance: 53.2 mL/min (A) (by C-G formula based on SCr of 1.01 mg/dL (H)). Liver Function Tests: Recent Labs  Lab  04/29/20 1343 05/01/20 0354  AST 23 17  ALT 28 26  ALKPHOS 102 100  BILITOT 1.2 0.4  PROT 6.6 6.9  ALBUMIN 2.3* 2.3*   No results for input(s): LIPASE, AMYLASE in the last 168 hours. No results for input(s): AMMONIA in the last 168 hours. Coagulation Profile: Recent Labs  Lab 04/29/20 1343  INR 1.2   Cardiac Enzymes: No results for input(s): CKTOTAL, CKMB, CKMBINDEX, TROPONINI in the last 168 hours. BNP (last 3 results) No results for input(s): PROBNP in the last 8760 hours. HbA1C: Recent Labs    04/29/20 1515  HGBA1C 5.9*   CBG: Recent Labs  Lab 04/30/20 1130 04/30/20 1656 04/30/20 2206 05/01/20 0639 05/01/20 1142  GLUCAP 193* 195* 273* 206* 343*   Lipid Profile: No  results for input(s): CHOL, HDL, LDLCALC, TRIG, CHOLHDL, LDLDIRECT in the last 72 hours. Thyroid Function Tests: No results for input(s): TSH, T4TOTAL, FREET4, T3FREE, THYROIDAB in the last 72 hours. Anemia Panel: No results for input(s): VITAMINB12, FOLATE, FERRITIN, TIBC, IRON, RETICCTPCT in the last 72 hours. Sepsis Labs: Recent Labs  Lab 04/29/20 1343 04/29/20 1533 04/29/20 1800 04/30/20 0309 05/01/20 0354  PROCALCITON  --   --  0.23 0.33 0.17  LATICACIDVEN 1.3 1.1  --   --   --     Recent Results (from the past 240 hour(s))  Blood Culture (routine x 2)     Status: None (Preliminary result)   Collection Time: 04/29/20  1:43 PM   Specimen: BLOOD  Result Value Ref Range Status   Specimen Description BLOOD RIGHT ANTECUBITAL  Final   Special Requests   Final    BOTTLES DRAWN AEROBIC AND ANAEROBIC Blood Culture adequate volume   Culture   Final    NO GROWTH 2 DAYS Performed at [facility], [place] [address] [place], [place] [address]    Report Status PENDING  Incomplete  Urine culture     Status: None   Collection Time: 04/29/20  1:50 PM   Specimen: In/Out Cath Urine  Result Value Ref Range Status   Specimen Description IN/OUT CATH URINE  Final   Special Requests NONE  Final    Culture   Final    NO GROWTH Performed at [facility], [place] [address], [place], [place] [address]    Report Status 04/30/2020 FINAL  Final  Blood Culture (routine x 2)     Status: None (Preliminary result)   Collection Time: 04/29/20  2:09 PM   Specimen: BLOOD RIGHT FOREARM  Result Value Ref Range Status   Specimen Description BLOOD RIGHT FOREARM  Final   Special Requests   Final    BOTTLES DRAWN AEROBIC AND ANAEROBIC Blood Culture adequate volume   Culture   Final    NO GROWTH 2 DAYS Performed at [facility], [place] [address] [place], [place] [address]    Report Status PENDING  Incomplete  SARS Coronavirus 2 by RT PCR (hospital order, performed in [facility] hospital lab) Nasopharyngeal Nasopharyngeal Swab     Status: None   Collection Time: 04/29/20  3:20 PM   Specimen: Nasopharyngeal Swab  Result Value Ref Range Status   SARS Coronavirus 2 NEGATIVE NEGATIVE Final    Comment: (NOTE) SARS-CoV-2 target nucleic acids are NOT DETECTED. The SARS-CoV-2 RNA is generally detectable in upper and lower respiratory specimens during the acute phase of infection. The lowest concentration of SARS-CoV-2 viral copies this assay can detect is 250 copies / mL. A negative result does not preclude SARS-CoV-2 infection and should not be used as the sole basis for treatment or other patient management decisions.  A negative result may occur with improper specimen collection / handling, submission of specimen other than nasopharyngeal swab, presence of viral mutation(s) within the areas targeted by this assay, and inadequate number of viral copies (<250 copies / mL). A negative result must be combined with clinical observations, patient history, and epidemiological information. Fact Sheet for Patients:   StrictlyIdeas.no Fact Sheet for Healthcare Providers: BankingDealers.co.za This test is not yet approved or cleared  by the [place] FDA and has been authorized for detection and/or diagnosis of SARS-CoV-2 by FDA under an Emergency Use Authorization (EUA).  This EUA will remain in effect (meaning this test can be  used) for the duration of the COVID-19 declaration under Section 564(b)(1) of the Act, 21 U.S.C. section 360bbb-3(b)(1), unless the authorization is terminated or revoked sooner. Performed at [facility], [name] [address] [place], [place] [address]   MRSA PCR Screening     Status: None   Collection Time: 04/30/20  6:10 AM   Specimen: Nasal Mucosa; Nasopharyngeal  Result Value Ref Range Status   MRSA by PCR NEGATIVE NEGATIVE Final    Comment:        The GeneXpert MRSA Assay (FDA approved for NASAL specimens only), is one component of a comprehensive MRSA colonization surveillance program. It is not intended to diagnose MRSA infection nor to guide or monitor treatment for MRSA infections. Performed at [facility], [place] [address] [place],  [address]      Radiology Studies: CT Chest Wo Contrast  Result Date: 04/29/2020 CLINICAL DATA:  Pneumonia. Pleural effusion or abscess suspected. Follow-up to current chest radiograph. EXAM: CT CHEST WITHOUT CONTRAST TECHNIQUE: Multidetector CT imaging of the chest was performed following the standard protocol without IV contrast. COMPARISON:  Current chest radiograph.  CT chest dated 04/15/2020. FINDINGS: Cardiovascular: Heart is mildly enlarged. No pericardial effusion. Minimal left coronary artery calcifications. Aorta is normal in caliber. Minor atherosclerotic calcifications along the descending portion. Main pulmonary artery is dilated to 3.8 cm. Mediastinum/Nodes: No neck base, mediastinal or hilar masses or enlarged lymph nodes. Trachea and esophagus are unremarkable. Lungs/Pleura: Small right and trace left pleural effusions. Mild dependent atelectasis noted in both lower lobes and minimally at the diaphragmatic bases of the left upper  lobe lingula right middle lobe and dependent upper lobes. Minor linear atelectasis in the left upper lobe. No convincing pneumonia and no evidence of pulmonary edema. No mass or suspicious nodule. No pneumothorax. Upper Abdomen: Multiple liver and bilateral renal cysts consistent with polycystic kidney disease. Enlarged spleen, 14.5 cm greatest transverse dimension. No acute findings in the visualized upper abdomen and no change from the prior CT. Musculoskeletal: Multiple old bilateral rib fractures. No acute fractures. No osteoblastic or osteolytic lesions. IMPRESSION: 1. Small right and trace left pleural effusions. There is associated atelectasis, but no convincing pneumonia and no evidence of pulmonary edema. No abscess. The lung appearance is similar to the previous chest CT. 2. Mild cardiomegaly. 3. Enlarged main pulmonary artery suggesting pulmonary artery hypertension. 4. Stable changes polycystic kidney disease including multiple liver cysts. Stable splenomegaly. Aortic Atherosclerosis (ICD10-I70.0). Electronically Signed   By: Lajean Manes M.D.   On: 04/29/2020 16:57    Scheduled Meds: . allopurinol  300 mg Oral Daily  . ascorbic acid  500 mg Oral Daily  . aspirin  81 mg Oral Daily  . brimonidine  1 drop Both Eyes BID  . budesonide  0.5 mg Nebulization BID  . cholecalciferol  1,000 Units Oral Daily  . collagenase   Topical Daily  . cycloSPORINE  1 drop Both Eyes BID  . docusate sodium  100 mg Oral BID  . dorzolamide  1 drop Both Eyes Daily  . doxycycline  100 mg Oral Q12H  . enoxaparin (LOVENOX) injection  40 mg Subcutaneous Q24H  . feeding supplement (ENSURE ENLIVE)  237 mL Oral TID BM  . feeding supplement (PRO-STAT SUGAR FREE 64)  30 mL Oral BID WC  . ferrous sulfate  325 mg Oral Q breakfast  . FLUoxetine  40 mg Oral Daily  . fluticasone furoate-vilanterol  1 puff Inhalation Daily  . furosemide  40 mg  Oral Daily  . gabapentin  200 mg Oral TID  . insulin aspart  0-9 Units  Subcutaneous TID WC  . insulin glargine  15 Units Subcutaneous Daily  . ipratropium-albuterol  3 mL Nebulization BID  . latanoprost  1 drop Both Eyes QHS  . magnesium oxide  400 mg Oral BID  . multivitamin with minerals  1 tablet Oral Daily  . mycophenolate  180 mg Oral BID  . nutrition supplement (JUVEN)  1 packet Oral BID BM  . pantoprazole  40 mg Oral BID  . [START ON 05/02/2020] predniSONE  20 mg Oral BID WC  . tacrolimus  0.5 mg Oral QHS  . tacrolimus  1 mg Oral Daily  . valACYclovir  500 mg Oral BID  . verapamil  240 mg Oral Daily  . vitamin B-12  1,000 mcg Oral Daily   Continuous Infusions:   LOS: 1 day   Darliss Cheney, MD [facility] Pager On Amion  If 7PM-7AM, please contact night-coverage 05/01/2020, 3:42 PM

## 2020-05-02 DIAGNOSIS — J4541 Moderate persistent asthma with (acute) exacerbation: Secondary | ICD-10-CM

## 2020-05-02 LAB — GLUCOSE, CAPILLARY
Glucose-Capillary: 169 mg/dL — ABNORMAL HIGH (ref 70–99)
Glucose-Capillary: 194 mg/dL — ABNORMAL HIGH (ref 70–99)
Glucose-Capillary: 216 mg/dL — ABNORMAL HIGH (ref 70–99)
Glucose-Capillary: 276 mg/dL — ABNORMAL HIGH (ref 70–99)
Glucose-Capillary: 357 mg/dL — ABNORMAL HIGH (ref 70–99)

## 2020-05-02 MED ORDER — INSULIN GLARGINE 100 UNIT/ML ~~LOC~~ SOLN
20.0000 [IU] | Freq: Every day | SUBCUTANEOUS | Status: DC
  Administered 2020-05-02 – 2020-05-03 (×2): 20 [IU] via SUBCUTANEOUS
  Filled 2020-05-02 (×2): qty 0.2

## 2020-05-02 NOTE — Plan of Care (Signed)
  Problem: Pain Managment: Goal: General experience of comfort will improve Outcome: Progressing   

## 2020-05-02 NOTE — Plan of Care (Signed)
  Problem: Elimination: Goal: Will not experience complications related to bowel motility Outcome: Progressing Goal: Will not experience complications related to urinary retention Outcome: Progressing   Problem: Coping: Goal: Level of anxiety will decrease Outcome: Progressing   Problem: Safety: Goal: Ability to remain free from injury will improve Outcome: Progressing

## 2020-05-02 NOTE — Progress Notes (Signed)
Physical Therapy Treatment Patient Details Name: April [name] MRN: [id number] DOB: [birth year] Today's Date: 05/02/2020    History of Present Illness  April [name] is a 64 y.o. female with medical history significant of polycystic kidney disease s/p renal transplant (on prednisone, mycophenalate, prograf), COPD on 2L [place] baseline, diastolic CHF, chronic hypoxic resp failure, obesity, HTN, recurrent UTI, presenting to ED with SOB wheezing and palpitations.    PT Comments    Pt in bed upon arrival of PT, agreeable to session with focus on progressing OOB mobility and endurance. The pt was able to demo significant improvements in independence with bed mobility and was able to come to sitting EOB without any physical assist despite continued use of bed rails and extra time. The pt was then able to complete multiple sit-stand transfers and stand-pivot with RW and minA-minG for safety. The pt fatigues easily and remains very anxious about falling, and will therefore continue to benefit from skilled PT to further improve functional endurance and stability to facilitate return to independence following d/c.     Follow Up Recommendations  SNF     Equipment Recommendations  None recommended by PT    Recommendations for Other Services       Precautions / Restrictions Precautions Precautions: Fall Precaution Comments: 2L O2 Restrictions Weight Bearing Restrictions: No    Mobility  Bed Mobility Overal bed mobility: Needs Assistance Bed Mobility: Supine to Sit     Supine to sit: Min guard;HOB elevated     General bed mobility comments: no assist given to assist pt in rising from bed, but sig extra time. Pt able to scoot with some cues and continued extra time.  Transfers Overall transfer level: Needs assistance Equipment used: Rolling walker (2 wheeled) Transfers: Sit to/from [facility] Sit to Stand: Min assist;Min guard;From elevated surface Stand pivot  transfers: Min assist       General transfer comment: minA initially, progresed to minG with elevated surface and instruction to use momentum. Pt anxious about falling, able to stand x4 through session from various surfaces. Completed stand pivot from bed - BSC and BSC- recliner without LOB or need for assist with RW management  Ambulation/Gait Ambulation/Gait assistance: Min assist Gait Distance (Feet): 3 Feet Assistive device: Rolling walker (2 wheeled) Gait Pattern/deviations: Step-to pattern;Shuffle Gait velocity: decreased   General Gait Details: able to take ~5-10 side steps from bed-BSC and BSC - recliener with use of RW and no LOB. Pt reports feeling weak but no evidence of knee buckling   Stairs             Wheelchair Mobility    Modified Rankin (Stroke Patients Only)       Balance Overall balance assessment: Needs assistance Sitting-balance support: No upper extremity supported;Feet supported Sitting balance-Leahy Scale: Good Sitting balance - Comments: supervision at EOB, pt able to verbalize when she feels too close to EOB and repositions without cues   Standing balance support: Bilateral upper extremity supported Standing balance-Leahy Scale: Poor Standing balance comment: reliant on external support                            Cognition Arousal/Alertness: Awake/alert Behavior During Therapy: [facility] for tasks assessed/performed;Anxious Overall Cognitive Status: Within Functional Limits for tasks assessed  General Comments: pt able to verbalize some anxiety about falling, generally fucntional otherwise      Exercises General Exercises - Lower Extremity Long Arc Quad: AROM;Both;Seated;10 reps (5 second count eccentric lower) Heel Slides: AROM;Both;10 reps;Seated (isometric hold at end of of LAQ against recliner) Hip Flexion/Marching: AROM;Both;20 reps;Seated Heel Raises: AROM;Both;15 reps;Seated     General Comments General comments (skin integrity, edema, etc.): pt completed session on 2L O2 with SpO2 at 100%.      Pertinent Vitals/Pain Pain Assessment: Faces Faces Pain Scale: Hurts a little bit Pain Location: sacral wound/ulcer Pain Descriptors / Indicators: Grimacing Pain Intervention(s): Monitored during session;Repositioned    Home Living                      Prior Function            PT Goals (current goals can now be found in the care plan section) Acute Rehab PT Goals Patient Stated Goal: be strong enough to be able to go home PT Goal Formulation: With patient Time For Goal Achievement: 05/14/20 Potential to Achieve Goals: Fair Progress towards PT goals: Progressing toward goals    Frequency    Min 2X/week      PT Plan Current plan remains appropriate    Co-evaluation              AM-PAC PT "6 Clicks" Mobility   Outcome Measure  Help needed turning from your back to your side while in a flat bed without using bedrails?: A Little Help needed moving from lying on your back to sitting on the side of a flat bed without using bedrails?: A Little Help needed moving to and from a bed to a chair (including a wheelchair)?: A Little Help needed standing up from a chair using your arms (e.g., wheelchair or bedside chair)?: A Little Help needed to walk in hospital room?: A Lot Help needed climbing 3-5 steps with a railing? : Total 6 Click Score: 15    End of Session Equipment Utilized During Treatment: Gait belt;Oxygen (2L) Activity Tolerance: Patient limited by fatigue Patient left: in chair;with call bell/phone within reach Nurse Communication: Mobility status;Other (comment) (pt can use RW unless too fatigued, then stedy) PT Visit Diagnosis: Other abnormalities of gait and mobility (R26.89);Repeated falls (R29.6);Muscle weakness (generalized) (M62.81)     Time: 3419-6222 PT Time Calculation (min) (ACUTE ONLY): 46 min  Charges:  $Gait  Training: 23-37 mins $Therapeutic Activity: 8-22 mins                     Karma Ganja, PT, DPT   Acute Rehabilitation Department Pager #: [phone]   Otho Bellows 05/02/2020, 12:34 PM

## 2020-05-02 NOTE — Progress Notes (Signed)
Inpatient Diabetes Program Recommendations  AACE/ADA: New Consensus Statement on Inpatient Glycemic Control (2015)  Target Ranges:  Prepandial:   less than 140 mg/dL      Peak postprandial:   less than 180 mg/dL (1-2 hours)      Critically ill patients:  140 - 180 mg/dL   Lab Results  Component Value Date   GLUCAP 169 (H) 05/02/2020   HGBA1C 5.9 (H) 04/29/2020    Review of Glycemic Control Results for CHIYEKO, FERRE (MRN [id number]) as of 05/02/2020 09:26  Ref. Range 05/01/2020 06:39 05/01/2020 11:42 05/01/2020 16:19 05/01/2020 21:11 05/02/2020 00:19 05/02/2020 06:30  Glucose-Capillary Latest Ref Range: 70 - 99 mg/dL 206 (H) 343 (H) 214 (H) 307 (H) 357 (H) 169 (H)   Diabetes history: no hx Outpatient Diabetes medications: none Current orders for Inpatient glycemic control: Novolog 0-9 units TID  PO prednisone 20 mg Daily Ensure Enlive tid between meals  Inpatient Diabetes Program Recommendations:    In the setting of steroids, glucose trends increase into 300 range.   - Consider adding Novolog 2 units TID (assuming patient is consuming >50% of meal).   Thanks, Tama Headings RN, MSN, BC-ADM Inpatient Diabetes Coordinator Team Pager [phone] (8a-5p)

## 2020-05-02 NOTE — Progress Notes (Signed)
PROGRESS NOTE    April [name]  KGM:[id number] DOB: [birth year] DOA: 04/29/2020 PCP: [name], MD    Brief Narrative:  64 y.o. female with medical history significant of polycystic kidney disease s/p renal transplant (on prednisone, mycophenalate, prograf), COPD on 2L  baseline, diastolic CHF, chronic hypoxic resp failure, obesity, HTN, recurrent UTI, presented to ED with SOB wheezing and palpitations.  No fever or chills.  Chest x-ray was clear.  White cells 14.1.  She was diagnosed with acute on chronic hypoxic respiratory failure secondary to acute asthma exacerbation and she was started on IV Solu-Medrol and p.o. doxycycline.  CT chest without contrast was done which showed small bilateral pleural effusion with some atelectasis no pulmonary edema.  No infiltrate.  She was admitted under hospital service.  She also had some musculoskeletal chest pain.  Over the course of next few days, patient improved and she has no wheezes now and is on 2 L of nasal oxygen which is her baseline.   Assessment & Plan:   Active Problems:   Asthma, mild intermittent   Asthma   Decubitus ulcer of coccyx, unstageable (HCC)   Acute on chronic hypoxic respiratory failure secondary to acute asthma exacerbation Now feeling much better and oxygen requirement is 2 L which is her baseline.  Continue doxycycline, Pulmicort, albuterol and prednisone.  Chest pain -Suspect chest wall pain in setting of acute asthma exacerbation -Cont with analgesics as tolerated.  She has no chest pain.  Hyperglycemia/prediabetes -No history of diabetes.  Blood sugar significantly elevated likely due to being on steroid.  On 15 units of Lantus but still with hyperglycemia.  Will increase to 20 units and continue SSI.  Hx of SVT -Continue verapamil as tolerated -Correct electrolytes as needed -Continued on tele  Hyponatremia -normalized -Repeat bmet in AM  History of kidney transplant -Continue with home  immunosuppressive regimen  Unstageable pressure injury to coccyx present on admit -WOC consulted Pressure Injury 04/07/20 Buttocks Left Stage 2 -  Partial thickness loss of dermis presenting as a shallow open injury with a red, pink wound bed without slough. (Active)  04/07/20 1730  Location: Buttocks  Location Orientation: Left  Staging: Stage 2 -  Partial thickness loss of dermis presenting as a shallow open injury with a red, pink wound bed without slough.  Wound Description (Comments):   Present on Admission: Yes     Pressure Injury 04/29/20 Coccyx Mid Stage 3 -  Full thickness tissue loss. Subcutaneous fat may be visible but bone, tendon or muscle are NOT exposed. (Active)  04/29/20 2130  Location: Coccyx  Location Orientation: Mid  Staging: Stage 3 -  Full thickness tissue loss. Subcutaneous fat may be visible but bone, tendon or muscle are NOT exposed.  Wound Description (Comments):   Present on Admission: Yes     DVT prophylaxis: Lovenox subq Code Status: Full Family Communication: No family present at bedside.  Discussed with patient herself.  Status is: Inpatient  Remains inpatient appropriate because:IV treatments appropriate due to intensity of illness or inability to take PO and Inpatient level of care appropriate due to severity of illness   Dispo: The patient is from: SNF              Anticipated d/c is to: SNF              Anticipated d/c date is: 1 to 2 days, waiting for insurance authorization  Patient currently medically stable for DC  Consultants:   None  Procedures:   None  Antimicrobials: Anti-infectives (From admission, onward)   Start     Dose/Rate Route Frequency Ordered Stop   04/29/20 2200  valACYclovir (VALTREX) tablet 500 mg     Discontinue     500 mg Oral 2 times daily 04/29/20 1515     04/29/20 2200  doxycycline (VIBRA-TABS) tablet 100 mg     Discontinue     100 mg Oral Every 12 hours 04/29/20 1551     04/29/20 1415   ceFEPIme (MAXIPIME) 2 g in sodium chloride 0.9 % 100 mL IVPB        2 g 200 mL/hr over 30 Minutes Intravenous  Once 04/29/20 1404 04/29/20 1446      Subjective: Seen and examined.  Feels better and just about the same.  No new complaint.  Objective: Vitals:   05/02/20 0429 05/02/20 0820 05/02/20 0908 05/02/20 0910  BP: 135/72  (!) 123/58   Pulse: 77  95   Resp: 18  18   Temp: 98.9 F (37.2 C)   98.6 F (37 C)  TempSrc:      SpO2: 100% 98% 98%   Weight:      Height:        Intake/Output Summary (Last 24 hours) at 05/02/2020 1556 Last data filed at 05/02/2020 1300 Gross per 24 hour  Intake 600 ml  Output 1525 ml  Net -925 ml   Filed Weights   04/29/20 1247 04/30/20 0121  Weight: 70.8 kg 74.4 kg    Examination:  General exam: Appears calm and comfortable  Respiratory system: Clear to auscultation. Respiratory effort normal. Cardiovascular system: S1 & S2 heard, RRR. No JVD, murmurs, rubs, gallops or clicks. No pedal edema. Gastrointestinal system: Abdomen is nondistended, soft and nontender. No organomegaly or masses felt. Normal bowel sounds heard. Central nervous system: Alert and oriented. No focal neurological deficits. Extremities: Symmetric 5 x 5 power. Skin: No rashes, lesions or ulcers.  Psychiatry: Judgement and insight appear normal. Mood & affect appropriate.   Data Reviewed: I have personally reviewed following labs and imaging studies  CBC: Recent Labs  Lab 04/29/20 1343 04/30/20 0309 05/01/20 0354  WBC 14.1* 12.5* 8.1  NEUTROABS 13.2*  --   --   HGB 8.7* 8.5* 8.4*  HCT 29.0* 28.1* 28.0*  MCV 101.0* 98.9 98.9  PLT 199 189 623   Basic Metabolic Panel: Recent Labs  Lab 04/29/20 1343 04/30/20 0309 05/01/20 0354  NA 130* 135 134*  K 3.7 4.7 5.1  CL 96* 98 100  CO2 23 24 25   GLUCOSE 289* 139* 243*  BUN 39* 50* 65*  CREATININE 0.89 1.03* 1.01*  CALCIUM 9.0 9.6 10.0   GFR: Estimated Creatinine Clearance: 53.2 mL/min (A) (by C-G formula  based on SCr of 1.01 mg/dL (H)). Liver Function Tests: Recent Labs  Lab 04/29/20 1343 05/01/20 0354  AST 23 17  ALT 28 26  ALKPHOS 102 100  BILITOT 1.2 0.4  PROT 6.6 6.9  ALBUMIN 2.3* 2.3*   No results for input(s): LIPASE, AMYLASE in the last 168 hours. No results for input(s): AMMONIA in the last 168 hours. Coagulation Profile: Recent Labs  Lab 04/29/20 1343  INR 1.2   Cardiac Enzymes: No results for input(s): CKTOTAL, CKMB, CKMBINDEX, TROPONINI in the last 168 hours. BNP (last 3 results) No results for input(s): PROBNP in the last 8760 hours. HbA1C: No results for input(s): HGBA1C in the last 72  hours. CBG: Recent Labs  Lab 05/01/20 1619 05/01/20 2111 05/02/20 0019 05/02/20 0630 05/02/20 1127  GLUCAP 214* 307* 357* 169* 216*   Lipid Profile: No results for input(s): CHOL, HDL, LDLCALC, TRIG, CHOLHDL, LDLDIRECT in the last 72 hours. Thyroid Function Tests: No results for input(s): TSH, T4TOTAL, FREET4, T3FREE, THYROIDAB in the last 72 hours. Anemia Panel: No results for input(s): VITAMINB12, FOLATE, FERRITIN, TIBC, IRON, RETICCTPCT in the last 72 hours. Sepsis Labs: Recent Labs  Lab 04/29/20 1343 04/29/20 1533 04/29/20 1800 04/30/20 0309 05/01/20 0354  PROCALCITON  --   --  0.23 0.33 0.17  LATICACIDVEN 1.3 1.1  --   --   --     Recent Results (from the past 240 hour(s))  Blood Culture (routine x 2)     Status: None (Preliminary result)   Collection Time: 04/29/20  1:43 PM   Specimen: BLOOD  Result Value Ref Range Status   Specimen Description BLOOD RIGHT ANTECUBITAL  Final   Special Requests   Final    BOTTLES DRAWN AEROBIC AND ANAEROBIC Blood Culture adequate volume   Culture   Final    NO GROWTH 3 DAYS Performed at [facility], [place] [address] [place], [place] [address]    Report Status PENDING  Incomplete  Urine culture     Status: None   Collection Time: 04/29/20  1:50 PM   Specimen: In/Out Cath Urine  Result Value Ref Range Status    Specimen Description IN/OUT CATH URINE  Final   Special Requests NONE  Final   Culture   Final    NO GROWTH Performed at [facility], [place] [address], [place], [place] [address]    Report Status 04/30/2020 FINAL  Final  Blood Culture (routine x 2)     Status: None (Preliminary result)   Collection Time: 04/29/20  2:09 PM   Specimen: BLOOD RIGHT FOREARM  Result Value Ref Range Status   Specimen Description BLOOD RIGHT FOREARM  Final   Special Requests   Final    BOTTLES DRAWN AEROBIC AND ANAEROBIC Blood Culture adequate volume   Culture   Final    NO GROWTH 3 DAYS Performed at [facility], [place] [address], [place], [place] [address]    Report Status PENDING  Incomplete  SARS Coronavirus 2 by RT PCR (hospital order, performed in [facility] hospital lab) Nasopharyngeal Nasopharyngeal Swab     Status: None   Collection Time: 04/29/20  3:20 PM   Specimen: Nasopharyngeal Swab  Result Value Ref Range Status   SARS Coronavirus 2 NEGATIVE NEGATIVE Final    Comment: (NOTE) SARS-CoV-2 target nucleic acids are NOT DETECTED. The SARS-CoV-2 RNA is generally detectable in upper and lower respiratory specimens during the acute phase of infection. The lowest concentration of SARS-CoV-2 viral copies this assay can detect is 250 copies / mL. A negative result does not preclude SARS-CoV-2 infection and should not be used as the sole basis for treatment or other patient management decisions.  A negative result may occur with improper specimen collection / handling, submission of specimen other than nasopharyngeal swab, presence of viral mutation(s) within the areas targeted by this assay, and inadequate number of viral copies (<250 copies / mL). A negative result must be combined with clinical observations, patient history, and epidemiological information. Fact Sheet for Patients:   StrictlyIdeas.no Fact Sheet for Healthcare  Providers: BankingDealers.co.za This test is not yet approved or cleared  by the [place] FDA and has  been authorized for detection and/or diagnosis of SARS-CoV-2 by FDA under an Emergency Use Authorization (EUA).  This EUA will remain in effect (meaning this test can be used) for the duration of the COVID-19 declaration under Section 564(b)(1) of the Act, 21 U.S.C. section 360bbb-3(b)(1), unless the authorization is terminated or revoked sooner. Performed at [facility], [place] [address] [place], [place] [address]   MRSA PCR Screening     Status: None   Collection Time: 04/30/20  6:10 AM   Specimen: Nasal Mucosa; Nasopharyngeal  Result Value Ref Range Status   MRSA by PCR NEGATIVE NEGATIVE Final    Comment:        The GeneXpert MRSA Assay (FDA approved for NASAL specimens only), is one component of a comprehensive MRSA colonization surveillance program. It is not intended to diagnose MRSA infection nor to guide or monitor treatment for MRSA infections. Performed at [facility], [place] [address], [place], [place] [address]      Radiology Studies: No results found.  Scheduled Meds: . allopurinol  300 mg Oral Daily  . ascorbic acid  500 mg Oral Daily  . aspirin  81 mg Oral Daily  . brimonidine  1 drop Both Eyes BID  . budesonide  0.5 mg Nebulization BID  . cholecalciferol  1,000 Units Oral Daily  . collagenase   Topical Daily  . cycloSPORINE  1 drop Both Eyes BID  . docusate sodium  100 mg Oral BID  . dorzolamide  1 drop Both Eyes Daily  . doxycycline  100 mg Oral Q12H  . enoxaparin (LOVENOX) injection  40 mg Subcutaneous Q24H  . feeding supplement (ENSURE ENLIVE)  237 mL Oral TID BM  . feeding supplement (PRO-STAT SUGAR FREE 64)  30 mL Oral BID WC  . ferrous sulfate  325 mg Oral Q breakfast  . FLUoxetine  40 mg Oral Daily  . fluticasone furoate-vilanterol  1 puff Inhalation Daily  . furosemide  40 mg Oral Daily  . gabapentin   200 mg Oral TID  . insulin aspart  0-9 Units Subcutaneous TID WC  . insulin glargine  20 Units Subcutaneous Daily  . ipratropium-albuterol  3 mL Nebulization BID  . latanoprost  1 drop Both Eyes QHS  . magnesium oxide  400 mg Oral BID  . multivitamin with minerals  1 tablet Oral Daily  . mycophenolate  180 mg Oral BID  . nutrition supplement (JUVEN)  1 packet Oral BID BM  . pantoprazole  40 mg Oral BID  . predniSONE  20 mg Oral BID WC  . tacrolimus  0.5 mg Oral QHS  . tacrolimus  1 mg Oral Daily  . valACYclovir  500 mg Oral BID  . verapamil  240 mg Oral Daily  . vitamin B-12  1,000 mcg Oral Daily   Continuous Infusions:   LOS: 2 days   Darliss Cheney, MD [facility] Pager On Amion  If 7PM-7AM, please contact night-coverage 05/02/2020, 3:56 PM

## 2020-05-02 NOTE — Clinical Social Work Note (Addendum)
Patient medically stable for discharge pending insurance authorization. CSW has been checking the [employer] site today and the authorization request is pending. CSW will continue to check the web site and if approved, facilitate discharge to [facility]  Jennier Schissler Givens, MSW, LCSW Licensed Clinical Social Worker [place] [phone]

## 2020-05-03 LAB — CBC WITH DIFFERENTIAL/PLATELET
Abs Immature Granulocytes: 0.05 10*3/uL (ref 0.00–0.07)
Basophils Absolute: 0 10*3/uL (ref 0.0–0.1)
Basophils Relative: 0 %
Eosinophils Absolute: 0 10*3/uL (ref 0.0–0.5)
Eosinophils Relative: 0 %
HCT: 25.5 % — ABNORMAL LOW (ref 36.0–46.0)
Hemoglobin: 7.6 g/dL — ABNORMAL LOW (ref 12.0–15.0)
Immature Granulocytes: 1 %
Lymphocytes Relative: 5 %
Lymphs Abs: 0.4 10*3/uL — ABNORMAL LOW (ref 0.7–4.0)
MCH: 29.7 pg (ref 26.0–34.0)
MCHC: 29.8 g/dL — ABNORMAL LOW (ref 30.0–36.0)
MCV: 99.6 fL (ref 80.0–100.0)
Monocytes Absolute: 0.1 10*3/uL (ref 0.1–1.0)
Monocytes Relative: 2 %
Neutro Abs: 6.3 10*3/uL (ref 1.7–7.7)
Neutrophils Relative %: 92 %
Platelets: 181 10*3/uL (ref 150–400)
RBC: 2.56 MIL/uL — ABNORMAL LOW (ref 3.87–5.11)
RDW: 21.4 % — ABNORMAL HIGH (ref 11.5–15.5)
WBC: 6.7 10*3/uL (ref 4.0–10.5)
nRBC: 0 % (ref 0.0–0.2)

## 2020-05-03 LAB — GLUCOSE, CAPILLARY
Glucose-Capillary: 186 mg/dL — ABNORMAL HIGH (ref 70–99)
Glucose-Capillary: 246 mg/dL — ABNORMAL HIGH (ref 70–99)
Glucose-Capillary: 234 mg/dL — ABNORMAL HIGH (ref 70–99)
Glucose-Capillary: 303 mg/dL — ABNORMAL HIGH (ref 70–99)

## 2020-05-03 LAB — BASIC METABOLIC PANEL
Anion gap: 7 (ref 5–15)
BUN: 91 mg/dL — ABNORMAL HIGH (ref 8–23)
CO2: 29 mmol/L (ref 22–32)
Calcium: 10.3 mg/dL (ref 8.9–10.3)
Chloride: 98 mmol/L (ref 98–111)
Creatinine, Ser: 1.08 mg/dL — ABNORMAL HIGH (ref 0.44–1.00)
GFR calc Af Amer: >60 mL/min (ref >60)
GFR calc non Af Amer: 55 mL/min — ABNORMAL LOW (ref >60)
Glucose, Bld: 239 mg/dL — ABNORMAL HIGH (ref 70–99)
Potassium: 4.9 mmol/L (ref 3.5–5.1)
Sodium: 134 mmol/L — ABNORMAL LOW (ref 135–145)

## 2020-05-03 MED ORDER — PREDNISONE 20 MG PO TABS
20.0000 mg | ORAL_TABLET | Freq: Two times a day (BID) | ORAL | Status: DC
  Administered 2020-05-03: 20 mg via ORAL
  Filled 2020-05-03: qty 1

## 2020-05-03 MED ORDER — INSULIN GLARGINE 100 UNIT/ML ~~LOC~~ SOLN: 25.0000 [IU] | Freq: Every day | SUBCUTANEOUS | Status: DC

## 2020-05-03 MED ORDER — INSULIN GLARGINE 100 UNIT/ML ~~LOC~~ SOLN
5.0000 [IU] | Freq: Once | SUBCUTANEOUS | Status: AC
  Administered 2020-05-03: 5 [IU] via SUBCUTANEOUS
  Filled 2020-05-03: qty 0.05

## 2020-05-03 NOTE — Progress Notes (Signed)
Attempted by day RN,Kami to give report to RN at [facility] but nobody is picking up phone. This RN attempted x4, gave report to [place]. Waiting for [facility] to transport patient.

## 2020-05-03 NOTE — Progress Notes (Signed)
Patient transported to [facility] by [employer]. Cellphone, charger and white band for arm sent with patient.

## 2020-05-03 NOTE — Discharge Summary (Signed)
Physician Discharge Summary  April [name] TKW:[id number] DOB: 29-Nov-1955 DOA: 04/29/2020  PCP: [name], MD  Admit date: 04/29/2020 Discharge date: 05/03/2020  Admitted From: SNF Disposition: [facility]/SNF  Recommendations for Outpatient Follow-up:  1. Follow up with PCP in 1-2 weeks 2. Please obtain BMP/CBC in one week 3. Please follow up with your PCP on the following pending results: Unresulted Labs (From admission, onward) Comment         None       Home Health: None Equipment/Devices: None  Discharge Condition: Stable CODE STATUS: Full code Diet recommendation: Cardiac/renal  Subjective: Seen and examined.  Feels well.  No shortness of breath.  Brief/Interim Summary: 64 y.o.femalewith medical history significant ofpolycystic kidney disease s/p renal transplant (on prednisone, mycophenalate, prograf), COPD on 2L [place] baseline, diastolic CHF, chronic hypoxic resp failure, obesity, HTN, recurrent UTI, presented to ED withSOB wheezingand palpitations. No fever or chills.  Chest x-ray was clear.  White cells 14.1.  She was diagnosed with acute on chronic hypoxic respiratory failure secondary to acute asthma exacerbation and she was started on IV Solu-Medrol and p.o. doxycycline.  CT chest without contrast was done which showed small bilateral pleural effusion with some atelectasis no pulmonary edema.  No infiltrate.  She was admitted under hospital service.  She also had some musculoskeletal chest pain.  Over the course of next few days, patient improved and she has no wheezes now and is on 2 L of nasal oxygen which is her baseline.  She had hyperglycemia because of being on Solu-Medrol and this was managed with Lantus and this was modified every day and she was recently on 25 units along with SSI.  However her hemoglobin A1c was only 5.9 prior to starting the Solu-Medrol which indicates very good control.  For this reason, she is not being discharged on any Lantus as she  will resume her home dose of prednisone 7.5 mg daily which she has been taking for long time.  She is being discharged in stable condition.  Pressure Injury 04/07/20 Buttocks Left Stage 2 -  Partial thickness loss of dermis presenting as a shallow open injury with a red, pink wound bed without slough. (Active)  04/07/20 1730  Location: Buttocks  Location Orientation: Left  Staging: Stage 2 -  Partial thickness loss of dermis presenting as a shallow open injury with a red, pink wound bed without slough.  Wound Description (Comments):   Present on Admission: Yes     Pressure Injury 04/29/20 Coccyx Mid Stage 3 -  Full thickness tissue loss. Subcutaneous fat may be visible but bone, tendon or muscle are NOT exposed. (Active)  04/29/20 2130  Location: Coccyx  Location Orientation: Mid  Staging: Stage 3 -  Full thickness tissue loss. Subcutaneous fat may be visible but bone, tendon or muscle are NOT exposed.  Wound Description (Comments):   Present on Admission: Yes        Discharge Diagnoses:  Active Problems:   Asthma, mild intermittent   Asthma   Decubitus ulcer of coccyx, unstageable ([place])    Discharge Instructions   Allergies as of 05/03/2020      Reactions   Infed [iron Dextran] Other (See Comments)   Chest tightness   Pentamidine Itching, Shortness Of Breath, Swelling   Erythromycin [erythromycin] Other (See Comments)   Mouth Ulcers   Iohexol Other (See Comments)   Per patient "she has had a kidney transplant and should never have contrast"   Oxycodone Nausea Only, Nausea And  Vomiting   Erythromycin Rash   Causes breakout in mouth   Ultram [tramadol Hcl] Anxiety      Medication List    TAKE these medications   albuterol 108 (90 Base) MCG/ACT inhaler Commonly known as: VENTOLIN HFA Inhale 2 puffs into the lungs every 4 (four) hours as needed for wheezing or shortness of breath.   allopurinol 300 MG tablet Commonly known as: ZYLOPRIM Take 1 tablet (300 mg  total) by mouth daily.   Aplisol 5 UNIT/0.1ML injection Generic drug: tuberculin Inject 5 Units into the skin once.   ascorbic acid 500 MG tablet Commonly known as: VITAMIN C Take 500 mg by mouth daily.   aspirin 81 MG chewable tablet Chew 81 mg by mouth daily.   brimonidine 0.1 % Soln Commonly known as: ALPHAGAN P Place 1 drop into both eyes 2 (two) times daily.   budesonide 0.5 MG/2ML nebulizer solution Commonly known as: PULMICORT Take 2 mLs (0.5 mg total) by nebulization 2 (two) times daily.   cholecalciferol 25 MCG (1000 UNIT) tablet Commonly known as: VITAMIN D3 Take 1 tablet (1,000 Units total) by mouth daily.   collagenase ointment Commonly known as: SANTYL Apply 1 application topically daily.   cycloSPORINE 0.05 % ophthalmic emulsion Commonly known as: RESTASIS Place 1 drop into both eyes 2 (two) times daily.   dextromethorphan-guaiFENesin 30-600 MG 12hr tablet Commonly known as: MUCINEX DM Take 1 tablet by mouth 2 (two) times daily as needed for cough.   docusate sodium 100 MG capsule Commonly known as: COLACE Take 1 capsule (100 mg total) by mouth 2 (two) times daily. What changed: when to take this   dorzolamide 2 % ophthalmic solution Commonly known as: TRUSOPT Place 1 drop into both eyes daily.   protein supplement Liqd Take 30 mLs by mouth in the morning and at bedtime.   feeding supplement (ENSURE ENLIVE) Liqd Take 237 mLs by mouth 2 (two) times daily between meals.   feeding supplement (PRO-STAT SUGAR FREE 64) Liqd Take 30 mLs by mouth 2 (two) times daily with a meal.   ferrous sulfate 325 (65 FE) MG tablet Take 325 mg by mouth daily with breakfast.   FLUoxetine 40 MG capsule Commonly known as: PROZAC Take 1 capsule (40 mg total) by mouth daily.   furosemide 40 MG tablet Commonly known as: LASIX Take 1 tablet (40 mg total) by mouth daily.   gabapentin 100 MG capsule Commonly known as: NEURONTIN Take 200 mg by mouth 3 (three) times  daily.   guaiFENesin 600 MG 12 hr tablet Commonly known as: MUCINEX Take 1 tablet (600 mg total) by mouth 2 (two) times daily.   hydrOXYzine 10 MG tablet Commonly known as: ATARAX/VISTARIL Take 10 mg by mouth 2 (two) times daily as needed for anxiety.   ipratropium-albuterol 0.5-2.5 (3) MG/3ML Soln Commonly known as: DUONEB Take 3 mLs by nebulization 2 (two) times daily.   magnesium oxide 400 MG tablet Commonly known as: MAG-OX Take 1 tablet (400 mg total) by mouth 2 (two) times daily.   multivitamin with minerals Tabs tablet Take 1 tablet by mouth daily.   mycophenolate 180 MG EC tablet Commonly known as: MYFORTIC Take 180 mg by mouth 2 (two) times daily.   pantoprazole 40 MG tablet Commonly known as: PROTONIX Take 1 tablet (40 mg total) by mouth 2 (two) times daily.   predniSONE 5 MG tablet Commonly known as: DELTASONE Take 1.5 tablets (7.5 mg total) by mouth daily with breakfast.   sennosides-docusate sodium  8.6-50 MG tablet Commonly known as: SENOKOT-S Take 1 tablet by mouth daily.   Symbicort 160-4.5 MCG/ACT inhaler Generic drug: budesonide-formoterol Inhale 2 puffs into the lungs 2 (two) times daily.   tacrolimus 0.5 MG capsule Commonly known as: PROGRAF Take 0.5 mg by mouth at bedtime. Takes brand name Prograf   Prograf 1 MG capsule Generic drug: tacrolimus Take 1 mg by mouth daily.   Travatan Z 0.004 % Soln ophthalmic solution Generic drug: Travoprost (BAK Free) Place 1 drop into both eyes at bedtime.   valACYclovir 500 MG tablet Commonly known as: VALTREX Take 500 mg by mouth 2 (two) times daily.   verapamil 240 MG CR tablet Commonly known as: CALAN-SR Take 1 tablet (240 mg total) by mouth daily.   vitamin B-12 100 MCG tablet Commonly known as: CYANOCOBALAMIN Take 1,000 mcg by mouth daily.       Follow-up Information    [name], MD Follow up in 1 week(s).   Specialty: Family Medicine Contact information: [place] [address] [phone]              Allergies  Allergen Reactions  . Infed [Iron Dextran] Other (See Comments)    Chest tightness  . Pentamidine Itching, Shortness Of Breath and Swelling  . Erythromycin [Erythromycin] Other (See Comments)    Mouth Ulcers  . Iohexol Other (See Comments)    Per patient "she has had a kidney transplant and should never have contrast"  . Oxycodone Nausea Only and Nausea And Vomiting  . Erythromycin Rash    Causes breakout in mouth  . Ultram [Tramadol Hcl] Anxiety    Consultations: None   Procedures/Studies: CT ABDOMEN PELVIS WO CONTRAST  Result Date: 04/15/2020 CLINICAL DATA:  Worsening shortness of breath. Increased nausea and vomiting. Fever. Patient reports with recent fall with left-sided rib pain. EXAM: CT CHEST, ABDOMEN AND PELVIS WITHOUT CONTRAST TECHNIQUE: Multidetector CT imaging of the chest, abdomen and pelvis was performed following the standard protocol without IV contrast. COMPARISON:  Radiograph earlier this day. Most recent chest CT 07/27/2014, most recent abdominal CT 03/31/2019 FINDINGS: CT CHEST FINDINGS Cardiovascular: No periaortic stranding to suggest injury. Dilated main pulmonary artery at 4.1 cm. Small circumferential pericardial effusion. Multi chamber cardiomegaly. There are prominent venous collaterals anterior to the left axilla and in the included left upper extremity. Decreased density of the blood pool suggesting anemia. Mediastinum/Nodes: No mediastinal hematoma. No pneumomediastinum. Few scattered mediastinal lymph nodes that are not enlarged by size criteria. Limited assessment for hilar adenopathy in the absence of IV contrast. No esophageal wall thickening. Subcentimeter nodule in the left lobe of the thyroid gland, does not need further evaluation based on size. Lungs/Pleura: Small bilateral pleural effusions. Compressive atelectasis most prominent in the lower lobes. Additional subsegmental atelectasis  in the lingula and left upper lobe. No pneumothorax. No evidence of pulmonary contusion. No pulmonary mass. There is mild central bronchial thickening. Musculoskeletal: Remote left anterior first and second rib fractures. Remote anterior eighth, ninth, and tenth rib fractures with callus. Remote posterior tenth rib fracture. No evidence of acute rib fracture. Remote fractures of right anterior/lateral second through ninth ribs. No acute right rib fracture. No sternal fracture. No fracture of the thoracic spine, included clavicles or shoulder girdles. CT ABDOMEN PELVIS FINDINGS Hepatobiliary: Multiple hepatic cysts. No evidence of a Paddock injury or perihepatic hematoma. Layering hyperdensity in the gallbladder is likely sludge. No pericholecystic inflammation. No biliary dilatation. Pancreas: Pancreas is unremarkable. No peripancreatic inflammation. No evidence  of pancreatic injury or pancreatic cyst. Spleen: Splenomegaly with spleen spanning 15.4 cm cranial caudal. No perisplenic hematoma or focal abnormality. Adrenals/Urinary Tract: No adrenal nodule. Polycystic kidneys which are enlarged with innumerable cysts of varying sizes. 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 There is no transplant hydronephrosis. No evidence of renal injury. Urinary bladder is only minimally distended, partially obscured by streak artifact from left hip arthroplasty. No obvious focal bladder abnormality. Stomach/Bowel: Sigmoid colonic diverticulosis without diverticulitis. Mild colonic tortuosity. Small volume of colonic stool. Appendix is normal. Stomach and small bowel are unremarkable. No obstruction, inflammatory change, or evidence of injury. Vascular/Lymphatic: Aorto bi-iliac atherosclerosis. No aortic aneurysm. No retroperitoneal fluid. No adenopathy. Reproductive: Uterus partially obscured by streak artifact from left hip arthroplasty. No suspicious adnexal mass. Other: Small amount of fluid in the left inguinal canal is  unchanged from 12/20/2019 CT. No ascites or free fluid in the abdomen. No free air. There is a small fat containing umbilical hernia. Musculoskeletal: Left hip arthroplasty. The bones are under mineralized. Remote left superior and inferior pubic rami fractures. Remote bilateral sacral insufficiency fractures. No evidence of acute fracture. IMPRESSION: 1. No evidence of acute traumatic injury in the chest, abdomen, or pelvis. 2. Small bilateral pleural effusions with compressive atelectasis. 3. Multi chamber cardiomegaly. Small circumferential pericardial effusion. Dilated main pulmonary artery suggesting pulmonary arterial hypertension. 4. No acute findings in the abdomen/pelvis. 5. Polycystic kidneys and liver. Transplant kidney in the right iliac fossa without transplant hydronephrosis. 6. Splenomegaly. 7. Colonic diverticulosis without diverticulitis. 8. Remote bilateral rib fractures.  No acute rib fracture. Aortic Atherosclerosis (ICD10-I70.0). Electronically Signed   By: Keith Rake M.D.   On: 04/15/2020 21:39   DG Chest 1 View  Result Date: 04/16/2020 CLINICAL DATA:  Shortness of breath EXAM: CHEST  1 VIEW COMPARISON:  CT chest 04/15/2020 FINDINGS: Bilateral effusions are less well visualized on today's examination. There is interstitial opacity throughout the lungs with a perihilar and basilar predominance with some fissural and septal thickening as well. The heart is enlarged. Pulmonary vascularity is cephalized indistinct with prominence of the central pulmonary arteries is again noted, better seen on comparison. No acute osseous or soft tissue abnormality. Telemetry leads overlie the chest. IMPRESSION: Findings highly suspicious for congestive heart failure with cardiomegaly, vascular congestion and bilateral effusions with interstitial opacities suggesting some developing interstitial edema. Electronically Signed   By: [name] M.D.   On: 04/16/2020 01:43   DG Chest 2 View  Result Date:  04/07/2020 CLINICAL DATA:  Chest pain, lethargy. EXAM: CHEST - 2 VIEW COMPARISON:  Chest x-rays dated 03/14/2020 and 02/29/2020. FINDINGS: Stable mild cardiomegaly. No confluent opacity to suggest consolidating pneumonia. No pleural effusion or pneumothorax is seen. Osseous structures about the chest are unremarkable. IMPRESSION: No active cardiopulmonary disease. No evidence of pneumonia or pulmonary edema. Electronically Signed   By: Franki Cabot M.D.   On: 04/07/2020 05:28   CT Head Wo Contrast  Result Date: 04/15/2020 CLINICAL DATA:  Status post trauma. EXAM: CT HEAD WITHOUT CONTRAST TECHNIQUE: Contiguous axial images were obtained from the base of the skull through the vertex without intravenous contrast. COMPARISON:  Apr 19, 2019 FINDINGS: Brain: No evidence of acute infarction, hemorrhage, hydrocephalus, extra-axial collection or mass lesion/mass effect. Vascular: No hyperdense vessel or unexpected calcification. Skull: Normal. Negative for fracture or focal lesion. Sinuses/Orbits: No acute finding. Other: None. IMPRESSION: No acute intracranial pathology. Electronically Signed   By: Virgina Norfolk M.D.   On: 04/15/2020 21:44   CT  Chest Wo Contrast  Result Date: 04/29/2020 CLINICAL DATA:  Pneumonia. Pleural effusion or abscess suspected. Follow-up to current chest radiograph. EXAM: CT CHEST WITHOUT CONTRAST TECHNIQUE: Multidetector CT imaging of the chest was performed following the standard protocol without IV contrast. COMPARISON:  Current chest radiograph.  CT chest dated 04/15/2020. FINDINGS: Cardiovascular: Heart is mildly enlarged. No pericardial effusion. Minimal left coronary artery calcifications. Aorta is normal in caliber. Minor atherosclerotic calcifications along the descending portion. Main pulmonary artery is dilated to 3.8 cm. Mediastinum/Nodes: No neck base, mediastinal or hilar masses or enlarged lymph nodes. Trachea and esophagus are unremarkable. Lungs/Pleura: Small right and  trace left pleural effusions. Mild dependent atelectasis noted in both lower lobes and minimally at the diaphragmatic bases of the left upper lobe lingula right middle lobe and dependent upper lobes. Minor linear atelectasis in the left upper lobe. No convincing pneumonia and no evidence of pulmonary edema. No mass or suspicious nodule. No pneumothorax. Upper Abdomen: Multiple liver and bilateral renal cysts consistent with polycystic kidney disease. Enlarged spleen, 14.5 cm greatest transverse dimension. No acute findings in the visualized upper abdomen and no change from the prior CT. Musculoskeletal: Multiple old bilateral rib fractures. No acute fractures. No osteoblastic or osteolytic lesions. IMPRESSION: 1. Small right and trace left pleural effusions. There is associated atelectasis, but no convincing pneumonia and no evidence of pulmonary edema. No abscess. The lung appearance is similar to the previous chest CT. 2. Mild cardiomegaly. 3. Enlarged main pulmonary artery suggesting pulmonary artery hypertension. 4. Stable changes polycystic kidney disease including multiple liver cysts. Stable splenomegaly. Aortic Atherosclerosis (ICD10-I70.0). Electronically Signed   By: Lajean Manes M.D.   On: 04/29/2020 16:57   CT Chest Wo Contrast  Result Date: 04/15/2020 CLINICAL DATA:  Worsening shortness of breath. Increased nausea and vomiting. Fever. Patient reports with recent fall with left-sided rib pain. EXAM: CT CHEST, ABDOMEN AND PELVIS WITHOUT CONTRAST TECHNIQUE: Multidetector CT imaging of the chest, abdomen and pelvis was performed following the standard protocol without IV contrast. COMPARISON:  Radiograph earlier this day. Most recent chest CT 07/27/2014, most recent abdominal CT 03/31/2019 FINDINGS: CT CHEST FINDINGS Cardiovascular: No periaortic stranding to suggest injury. Dilated main pulmonary artery at 4.1 cm. Small circumferential pericardial effusion. Multi chamber cardiomegaly. There are  prominent venous collaterals anterior to the left axilla and in the included left upper extremity. Decreased density of the blood pool suggesting anemia. Mediastinum/Nodes: No mediastinal hematoma. No pneumomediastinum. Few scattered mediastinal lymph nodes that are not enlarged by size criteria. Limited assessment for hilar adenopathy in the absence of IV contrast. No esophageal wall thickening. Subcentimeter nodule in the left lobe of the thyroid gland, does not need further evaluation based on size. Lungs/Pleura: Small bilateral pleural effusions. Compressive atelectasis most prominent in the lower lobes. Additional subsegmental atelectasis in the lingula and left upper lobe. No pneumothorax. No evidence of pulmonary contusion. No pulmonary mass. There is mild central bronchial thickening. Musculoskeletal: Remote left anterior first and second rib fractures. Remote anterior eighth, ninth, and tenth rib fractures with callus. Remote posterior tenth rib fracture. No evidence of acute rib fracture. Remote fractures of right anterior/lateral second through ninth ribs. No acute right rib fracture. No sternal fracture. No fracture of the thoracic spine, included clavicles or shoulder girdles. CT ABDOMEN PELVIS FINDINGS Hepatobiliary: Multiple hepatic cysts. No evidence of a Paddock injury or perihepatic hematoma. Layering hyperdensity in the gallbladder is likely sludge. No pericholecystic inflammation. No biliary dilatation. Pancreas: Pancreas is unremarkable. No peripancreatic inflammation. No  evidence of pancreatic injury or pancreatic cyst. Spleen: Splenomegaly with spleen spanning 15.4 cm cranial caudal. No perisplenic hematoma or focal abnormality. Adrenals/Urinary Tract: No adrenal nodule. Polycystic kidneys which are enlarged with innumerable cysts of varying sizes. 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 There is no transplant hydronephrosis. No evidence of renal injury. Urinary bladder is only minimally  distended, partially obscured by streak artifact from left hip arthroplasty. No obvious focal bladder abnormality. Stomach/Bowel: Sigmoid colonic diverticulosis without diverticulitis. Mild colonic tortuosity. Small volume of colonic stool. Appendix is normal. Stomach and small bowel are unremarkable. No obstruction, inflammatory change, or evidence of injury. Vascular/Lymphatic: Aorto bi-iliac atherosclerosis. No aortic aneurysm. No retroperitoneal fluid. No adenopathy. Reproductive: Uterus partially obscured by streak artifact from left hip arthroplasty. No suspicious adnexal mass. Other: Small amount of fluid in the left inguinal canal is unchanged from 12/20/2019 CT. No ascites or free fluid in the abdomen. No free air. There is a small fat containing umbilical hernia. Musculoskeletal: Left hip arthroplasty. The bones are under mineralized. Remote left superior and inferior pubic rami fractures. Remote bilateral sacral insufficiency fractures. No evidence of acute fracture. IMPRESSION: 1. No evidence of acute traumatic injury in the chest, abdomen, or pelvis. 2. Small bilateral pleural effusions with compressive atelectasis. 3. Multi chamber cardiomegaly. Small circumferential pericardial effusion. Dilated main pulmonary artery suggesting pulmonary arterial hypertension. 4. No acute findings in the abdomen/pelvis. 5. Polycystic kidneys and liver. Transplant kidney in the right iliac fossa without transplant hydronephrosis. 6. Splenomegaly. 7. Colonic diverticulosis without diverticulitis. 8. Remote bilateral rib fractures.  No acute rib fracture. Aortic Atherosclerosis (ICD10-I70.0). Electronically Signed   By: Keith Rake M.D.   On: 04/15/2020 21:39   CT Cervical Spine Wo Contrast  Result Date: 04/15/2020 CLINICAL DATA:  Status post trauma. EXAM: CT CERVICAL SPINE WITHOUT CONTRAST TECHNIQUE: Multidetector CT imaging of the cervical spine was performed without intravenous contrast. Multiplanar CT image  reconstructions were also generated. COMPARISON:  None. FINDINGS: Alignment: Approximately 1 mm to 2 mm anterolisthesis of the C4 vertebral body is seen on C5. Skull base and vertebrae: No acute fracture. No primary bone lesion or focal pathologic process. Soft tissues and spinal canal: No prevertebral fluid or swelling. No visible canal hematoma. Disc levels: Mild endplate sclerosis is seen at the level of C5-C6. Moderate severity intervertebral disc space narrowing is also seen at this level. Upper chest: Negative. Other: None. IMPRESSION: 1. No acute osseous abnormality. 2. Approximately 1 mm to 2 mm anterolisthesis of the C4 vertebral body on C5. 3. Moderate severity degenerative changes at the level of C5-C6. Electronically Signed   By: Virgina Norfolk M.D.   On: 04/15/2020 21:26   CT L-SPINE NO CHARGE  Result Date: 04/15/2020 CLINICAL DATA:  Back pain. Recent fall. EXAM: CT LUMBAR SPINE WITHOUT CONTRAST TECHNIQUE: Multidetector CT imaging of the lumbar spine was performed without intravenous contrast administration. Multiplanar CT image reconstructions were also generated. COMPARISON:  Lumbar spine radiographs 12/19/2019. CT pelvis 12/20/2019. FINDINGS: Segmentation: 5 lumbar type vertebrae. Alignment: Normal. Vertebrae: Chronic bilateral sacral fractures with sclerosis and angular deformity of the sacrum anteriorly at the S2 level. Chronic bilateral L5 transverse process fractures. No acute lumbar spine fracture. Small Schmorl's nodes at T11-12 and T12-L1. Paraspinal and other soft tissues: No acute paraspinal soft tissue abnormality identified. Intra-abdominal and pelvic contents reported separately. Disc levels: Intervertebral disc space heights are preserved in the lumbar spine. Disc bulging and severe facet hypertrophy at L4-5 result in mild right greater than left neural  foraminal stenosis without evidence of significant spinal stenosis. There is mild disc bulging at L3-4 with minimal to mild right  neural foraminal stenosis. IMPRESSION: 1. No acute osseous abnormality identified in the lumbar spine. 2. Chronic bilateral sacral fractures and chronic bilateral L5 transverse process fractures. 3. Severe L4-5 facet hypertrophy with mild right greater than left neural foraminal stenosis. Electronically Signed   By: Logan Bores M.D.   On: 04/15/2020 21:32   DG Chest Port 1 View  Result Date: 04/29/2020 CLINICAL DATA:  Shortness of breath EXAM: PORTABLE CHEST 1 VIEW COMPARISON:  Apr 16, 2020 FINDINGS: There is atelectatic change in the lower lung zones bilaterally, stable. No edema or airspace opacity. Heart is mildly enlarged. The pulmonary vascularity is within normal limits. No adenopathy. No bone lesions. IMPRESSION: Mild bibasilar atelectasis. No edema or airspace opacity. Heart mildly enlarged with pulmonary vascularity within normal limits. No evident adenopathy. Electronically Signed   By: Lowella Grip III M.D.   On: 04/29/2020 14:39   DG Chest Port 1 View  Result Date: 04/16/2020 CLINICAL DATA:  Shortness of breath EXAM: PORTABLE CHEST 1 VIEW COMPARISON:  Apr 16, 2020 study obtained earlier in the day FINDINGS: Heart is mildly enlarged with mild pulmonary venous hypertension. There is slight interstitial edema. No appreciable consolidation or pleural effusion. There is mild atelectatic change in the lower lung regions bilaterally. No adenopathy. No bone lesions. IMPRESSION: Mild cardiomegaly with a degree of pulmonary vascular congestion. Slight interstitial edema. There may be a degree of congestive heart failure. Suspect somewhat less edema compared to earlier in the day. No new opacity. No consolidation. Mild atelectatic change in each lower lung region. Electronically Signed   By: Lowella Grip III M.D.   On: 04/16/2020 08:46   DG Chest Portable 1 View  Result Date: 04/15/2020 CLINICAL DATA:  64 year old female with history of trauma from a fall complaining of left-sided rib pain.  EXAM: PORTABLE CHEST 1 VIEW COMPARISON:  Chest x-ray 04/07/2020. FINDINGS: Lung volumes are low. Linear opacity in the left mid lung, new compared to the recent prior study, presumably a focus of subsegmental atelectasis. No acute consolidative airspace disease. No pleural effusions. Cephalization of the pulmonary vasculature, without frank pulmonary edema. Heart size is mildly enlarged. Upper mediastinal contours are within normal limits. An old healed left-sided rib fractures are noted. No definite acute displaced rib fractures are identified. IMPRESSION: 1. Low lung volumes with new area of subsegmental atelectasis in the left mid lung. 2. Cardiomegaly with pulmonary venous congestion, but no frank pulmonary edema. 3. Multiple old healed left-sided rib fractures, similar to prior chest CT 07/27/2014. Electronically Signed   By: Vinnie Langton M.D.   On: 04/15/2020 19:23   ECHOCARDIOGRAM COMPLETE  Result Date: 04/17/2020    ECHOCARDIOGRAM REPORT   Patient Name:   SENORA LACSON Pucci Date of Exam: 04/17/2020 Medical Rec #:  [id number]         Height:       61.0 in Accession #:    [id number]        Weight:       159.0 lb Date of Birth:  06-15-1956         BSA:          1.713 m Patient Age:    21 years          BP:           138/74 mmHg Patient Gender: F  HR:           85 bpm. Exam Location:  Inpatient Procedure: 2D Echo, Color Doppler and Cardiac Doppler Indications:    Q76.22 Acute diastolic (congestive) heart failure  History:        Patient has prior history of Echocardiogram examinations, most                 recent 04/21/2019. CHF, COPD; Risk Factors:Dyslipidemia.  Sonographer:    Raquel Sarna Senior RDCS Referring Phys: [id number] Sherryll Burger [facility]  Sonographer Comments: Supine on CPAP. IMPRESSIONS  1. Left ventricular ejection fraction, by estimation, is 55 to 60%. The left ventricle has normal function. The left ventricle has no regional wall motion abnormalities. Left ventricular diastolic parameters  are consistent with Grade II diastolic dysfunction (pseudonormalization). Elevated left atrial pressure.  2. Right ventricular systolic function is normal. The right ventricular size is normal. There is mildly elevated pulmonary artery systolic pressure. The estimated right ventricular systolic pressure is 62.5 mmHg.  3. Left atrial size was mildly dilated.  4. The mitral valve is degenerative. Trivial mitral valve regurgitation. No evidence of mitral stenosis.  5. The aortic valve is tricuspid. Aortic valve regurgitation is not visualized. No aortic stenosis is present.  6. The inferior vena cava is dilated in size with <50% respiratory variability, suggesting right atrial pressure of 15 mmHg. Comparison(s): No significant change from prior study. FINDINGS  Left Ventricle: Left ventricular ejection fraction, by estimation, is 55 to 60%. The left ventricle has normal function. The left ventricle has no regional wall motion abnormalities. The left ventricular internal cavity size was normal in size. There is  no left ventricular hypertrophy. Left ventricular diastolic parameters are consistent with Grade II diastolic dysfunction (pseudonormalization). Elevated left atrial pressure. Right Ventricle: The right ventricular size is normal. No increase in right ventricular wall thickness. Right ventricular systolic function is normal. There is mildly elevated pulmonary artery systolic pressure. The tricuspid regurgitant velocity is 2.62  m/s, and with an assumed right atrial pressure of 15 mmHg, the estimated right ventricular systolic pressure is 63.8 mmHg. Left Atrium: Left atrial size was mildly dilated. Right Atrium: Right atrial size was normal in size. Pericardium: Trivial pericardial effusion is present. Presence of pericardial fat pad. Mitral Valve: The mitral valve is degenerative in appearance. Moderate mitral annular calcification. Trivial mitral valve regurgitation. No evidence of mitral valve stenosis.  Tricuspid Valve: The tricuspid valve is grossly normal. Tricuspid valve regurgitation is trivial. No evidence of tricuspid stenosis. Aortic Valve: The aortic valve is tricuspid. Aortic valve regurgitation is not visualized. No aortic stenosis is present. Pulmonic Valve: The pulmonic valve was grossly normal. Pulmonic valve regurgitation is not visualized. No evidence of pulmonic stenosis. Aorta: The aortic root and ascending aorta are structurally normal, with no evidence of dilitation. Venous: The inferior vena cava is dilated in size with less than 50% respiratory variability, suggesting right atrial pressure of 15 mmHg. IAS/Shunts: The atrial septum is grossly normal. Additional Comments: There is a small pleural effusion in the left lateral region.  LEFT VENTRICLE PLAX 2D LVIDd:         5.30 cm  Diastology LVIDs:         2.90 cm  LV e' lateral:   5.33 cm/s LV PW:         0.90 cm  LV E/e' lateral: 23.1 LV IVS:        1.10 cm  LV e' medial:    6.42 cm/s LVOT diam:  2.00 cm  LV E/e' medial:  19.2 LV SV:         82 LV SV Index:   48 LVOT Area:     3.14 cm  RIGHT VENTRICLE RV S prime:     9.25 cm/s TAPSE (M-mode): 1.6 cm LEFT ATRIUM             Index       RIGHT ATRIUM           Index LA diam:        4.40 cm 2.57 cm/m  RA Area:     15.20 cm LA Vol (A2C):   70.3 ml 41.04 ml/m RA Volume:   36.30 ml  21.19 ml/m LA Vol (A4C):   55.6 ml 32.46 ml/m LA Biplane Vol: 65.5 ml 38.24 ml/m  AORTIC VALVE LVOT Vmax:   130.00 cm/s LVOT Vmean:  87.300 cm/s LVOT VTI:    0.260 m  AORTA Ao Root diam: 3.60 cm Ao Asc diam:  3.60 cm MITRAL VALVE                TRICUSPID VALVE MV Area (PHT): 3.08 cm     TR Peak grad:   27.5 mmHg MV Decel Time: 246 msec     TR Vmax:        262.00 cm/s MV E velocity: 123.00 cm/s MV A velocity: 114.00 cm/s  SHUNTS MV E/A ratio:  1.08         Systemic VTI:  0.26 m                             Systemic Diam: 2.00 cm Eleonore Chiquito MD Electronically signed by Eleonore Chiquito MD Signature Date/Time:  04/17/2020/1:27:32 PM    Final    DG Hip Unilat W or Wo Pelvis 2-3 Views Left  Result Date: 04/15/2020 CLINICAL DATA:  Status post fall 3 days ago. EXAM: DG HIP (WITH OR WITHOUT PELVIS) 2-3V LEFT COMPARISON:  February 01, 2020 FINDINGS: There is no evidence of an acute hip fracture or dislocation. Chronic fractures of the left superior and left inferior pubic rami are present. Fragmentation of the left greater trochanter is again noted. 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 There is no evidence of surrounding lucency to suggest the presence of hardware loosening or infection. There is no evidence of arthropathy or other focal bone abnormality. Radiopaque surgical clips are seen overlying the right acetabulum and inferior aspect of the sacrum on the right. IMPRESSION: 1. No evidence of an acute osseous abnormality. 2. 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 Electronically Signed   By: Virgina Norfolk M.D.   On: 04/15/2020 21:38      Discharge Exam: Vitals:   05/03/20 0453 05/03/20 0847  BP: (!) 144/70 136/61  Pulse: 83 78  Resp: 19 18  Temp: 98.2 F (36.8 C) 98.2 F (36.8 C)  SpO2: 98% 99%   Vitals:   05/02/20 2044 05/02/20 2118 05/03/20 0453 05/03/20 0847  BP:  (!) 141/63 (!) 144/70 136/61  Pulse:  93 83 78  Resp:  20 19 18   Temp:  97.8 F (36.6 C) 98.2 F (36.8 C) 98.2 F (36.8 C)  TempSrc:  Oral  Oral  SpO2: 99% 99% 98% 99%  Weight:  74.4 kg    Height:        General: Pt is alert, awake, not in acute distress Cardiovascular: RRR, S1/S2 +, no rubs,  no gallops Respiratory: CTA bilaterally, no wheezing, no rhonchi Abdominal: Soft, NT, ND, bowel sounds + Extremities: no edema, no cyanosis    The results of significant diagnostics from this hospitalization (including imaging, microbiology, ancillary and laboratory) are listed below for reference.     Microbiology: Recent Results (from the past 240 hour(s))  Blood Culture (routine x 2)      Status: None (Preliminary result)   Collection Time: 04/29/20  1:43 PM   Specimen: BLOOD  Result Value Ref Range Status   Specimen Description BLOOD RIGHT ANTECUBITAL  Final   Special Requests   Final    BOTTLES DRAWN AEROBIC AND ANAEROBIC Blood Culture adequate volume   Culture   Final    NO GROWTH 4 DAYS Performed at [facility], [address], [place], [place] [address]    Report Status PENDING  Incomplete  Urine culture     Status: None   Collection Time: 04/29/20  1:50 PM   Specimen: In/Out Cath Urine  Result Value Ref Range Status   Specimen Description IN/OUT CATH URINE  Final   Special Requests NONE  Final   Culture   Final    NO GROWTH Performed at [facility], [place] [address], [place], [place] [address]    Report Status 04/30/2020 FINAL  Final  Blood Culture (routine x 2)     Status: None (Preliminary result)   Collection Time: 04/29/20  2:09 PM   Specimen: BLOOD RIGHT FOREARM  Result Value Ref Range Status   Specimen Description BLOOD RIGHT FOREARM  Final   Special Requests   Final    BOTTLES DRAWN AEROBIC AND ANAEROBIC Blood Culture adequate volume   Culture   Final    NO GROWTH 4 DAYS Performed at [facility], [place] [address] [place], [place] [address]    Report Status PENDING  Incomplete  SARS Coronavirus 2 by RT PCR (hospital order, performed in [facility] hospital lab) Nasopharyngeal Nasopharyngeal Swab     Status: None   Collection Time: 04/29/20  3:20 PM   Specimen: Nasopharyngeal Swab  Result Value Ref Range Status   SARS Coronavirus 2 NEGATIVE NEGATIVE Final    Comment: (NOTE) SARS-CoV-2 target nucleic acids are NOT DETECTED. The SARS-CoV-2 RNA is generally detectable in upper and lower respiratory specimens during the acute phase of infection. The lowest concentration of SARS-CoV-2 viral copies this assay can detect is 250 copies / mL. A negative result does not preclude SARS-CoV-2 infection and should not be used as the sole  basis for treatment or other patient management decisions.  A negative result may occur with improper specimen collection / handling, submission of specimen other than nasopharyngeal swab, presence of viral mutation(s) within the areas targeted by this assay, and inadequate number of viral copies (<250 copies / mL). A negative result must be combined with clinical observations, patient history, and epidemiological information. Fact Sheet for Patients:   StrictlyIdeas.no Fact Sheet for Healthcare Providers: BankingDealers.co.za This test is not yet approved or cleared  by the [place] FDA and has been authorized for detection and/or diagnosis of SARS-CoV-2 by FDA under an Emergency Use Authorization (EUA).  This EUA will remain in effect (meaning this test can be used) for the duration of the COVID-19 declaration under Section 564(b)(1) of the Act, 21 U.S.C. section 360bbb-3(b)(1), unless the authorization is terminated or revoked sooner. Performed at [facility], [place] [address] [place], [place] [address]   MRSA PCR  Screening     Status: None   Collection Time: 04/30/20  6:10 AM   Specimen: Nasal Mucosa; Nasopharyngeal  Result Value Ref Range Status   MRSA by PCR NEGATIVE NEGATIVE Final    Comment:        The GeneXpert MRSA Assay (FDA approved for NASAL specimens only), is one component of a comprehensive MRSA colonization surveillance program. It is not intended to diagnose MRSA infection nor to guide or monitor treatment for MRSA infections. Performed at [facility], [place] [address] [place], [place] [address]      Labs: BNP (last 3 results) Recent Labs    03/13/20 1044 04/07/20 0541 04/16/20 0608  BNP 552.6* 494.5* 8,841.6*   Basic Metabolic Panel: Recent Labs  Lab 04/29/20 1343 04/30/20 0309 05/01/20 0354 05/03/20 0500  NA 130* 135 134* 134*  K 3.7 4.7 5.1 4.9  CL 96* 98 100 98  CO2 23 24 25  29   GLUCOSE 289* 139* 243* 239*  BUN 39* 50* 65* 91*  CREATININE 0.89 1.03* 1.01* 1.08*  CALCIUM 9.0 9.6 10.0 10.3   Liver Function Tests: Recent Labs  Lab 04/29/20 1343 05/01/20 0354  AST 23 17  ALT 28 26  ALKPHOS 102 100  BILITOT 1.2 0.4  PROT 6.6 6.9  ALBUMIN 2.3* 2.3*   No results for input(s): LIPASE, AMYLASE in the last 168 hours. No results for input(s): AMMONIA in the last 168 hours. CBC: Recent Labs  Lab 04/29/20 1343 04/30/20 0309 05/01/20 0354 05/03/20 0500  WBC 14.1* 12.5* 8.1 6.7  NEUTROABS 13.2*  --   --  6.3  HGB 8.7* 8.5* 8.4* 7.6*  HCT 29.0* 28.1* 28.0* 25.5*  MCV 101.0* 98.9 98.9 99.6  PLT 199 189 190 181   Cardiac Enzymes: No results for input(s): CKTOTAL, CKMB, CKMBINDEX, TROPONINI in the last 168 hours. BNP: Invalid input(s): POCBNP CBG: Recent Labs  Lab 05/02/20 1127 05/02/20 1612 05/02/20 2119 05/03/20 0647 05/03/20 1159  GLUCAP 216* 276* 194* 186* 246*   D-Dimer No results for input(s): DDIMER in the last 72 hours. Hgb A1c No results for input(s): HGBA1C in the last 72 hours. Lipid Profile No results for input(s): CHOL, HDL, LDLCALC, TRIG, CHOLHDL, LDLDIRECT in the last 72 hours. Thyroid function studies No results for input(s): TSH, T4TOTAL, T3FREE, THYROIDAB in the last 72 hours.  Invalid input(s): FREET3 Anemia work up No results for input(s): VITAMINB12, FOLATE, FERRITIN, TIBC, IRON, RETICCTPCT in the last 72 hours. Urinalysis    Component Value Date/Time   COLORURINE YELLOW 04/29/2020 1350   APPEARANCEUR CLEAR 04/29/2020 1350   LABSPEC 1.014 04/29/2020 1350   PHURINE 5.0 04/29/2020 1350   GLUCOSEU NEGATIVE 04/29/2020 1350   HGBUR NEGATIVE 04/29/2020 1350   BILIRUBINUR NEGATIVE 04/29/2020 1350   KETONESUR NEGATIVE 04/29/2020 1350   PROTEINUR 30 (A) 04/29/2020 1350   UROBILINOGEN 0.2 05/15/2013 0733   NITRITE NEGATIVE 04/29/2020 1350   LEUKOCYTESUR NEGATIVE 04/29/2020 1350   Sepsis Labs Invalid input(s):  PROCALCITONIN,  WBC,  LACTICIDVEN Microbiology Recent Results (from the past 240 hour(s))  Blood Culture (routine x 2)     Status: None (Preliminary result)   Collection Time: 04/29/20  1:43 PM   Specimen: BLOOD  Result Value Ref Range Status   Specimen Description BLOOD RIGHT ANTECUBITAL  Final   Special Requests   Final    BOTTLES DRAWN AEROBIC AND ANAEROBIC Blood Culture adequate volume   Culture   Final    NO GROWTH 4 DAYS Performed at [facility], [place] [address], [name], [place] [address]    Report Status PENDING  Incomplete  Urine culture     Status: None   Collection Time: 04/29/20  1:50 PM   Specimen: In/Out Cath Urine  Result Value Ref Range Status   Specimen Description IN/OUT CATH URINE  Final   Special Requests NONE  Final   Culture   Final    NO GROWTH Performed at [facility], [place] [address], [place], [place] [address]    Report Status 04/30/2020 FINAL  Final  Blood Culture (routine x 2)     Status: None (Preliminary result)   Collection Time: 04/29/20  2:09 PM   Specimen: BLOOD RIGHT FOREARM  Result Value Ref Range Status   Specimen Description BLOOD RIGHT FOREARM  Final   Special Requests   Final    BOTTLES DRAWN AEROBIC AND ANAEROBIC Blood Culture adequate volume   Culture   Final    NO GROWTH 4 DAYS Performed at [facility], [place] [address] [place], [place] [address]    Report Status PENDING  Incomplete  SARS Coronavirus 2 by RT PCR (hospital order, performed in [facility] hospital lab) Nasopharyngeal Nasopharyngeal Swab     Status: None   Collection Time: 04/29/20  3:20 PM   Specimen: Nasopharyngeal Swab  Result Value Ref Range Status   SARS Coronavirus 2 NEGATIVE NEGATIVE Final    Comment: (NOTE) SARS-CoV-2 target nucleic acids are NOT DETECTED. The SARS-CoV-2 RNA is generally detectable in upper and lower respiratory specimens during the acute phase of infection. The lowest concentration of SARS-CoV-2 viral copies this assay  can detect is 250 copies / mL. A negative result does not preclude SARS-CoV-2 infection and should not be used as the sole basis for treatment or other patient management decisions.  A negative result may occur with improper specimen collection / handling, submission of specimen other than nasopharyngeal swab, presence of viral mutation(s) within the areas targeted by this assay, and inadequate number of viral copies (<250 copies / mL). A negative result must be combined with clinical observations, patient history, and epidemiological information. Fact Sheet for Patients:   StrictlyIdeas.no Fact Sheet for Healthcare Providers: BankingDealers.co.za This test is not yet approved or cleared  by the [place] FDA and has been authorized for detection and/or diagnosis of SARS-CoV-2 by FDA under an Emergency Use Authorization (EUA).  This EUA will remain in effect (meaning this test can be used) for the duration of the COVID-19 declaration under Section 564(b)(1) of the Act, 21 U.S.C. section 360bbb-3(b)(1), unless the authorization is terminated or revoked sooner. Performed at [facility], [place] [address], [place], [place] [address]   MRSA PCR Screening     Status: None   Collection Time: 04/30/20  6:10 AM   Specimen: Nasal Mucosa; Nasopharyngeal  Result Value Ref Range Status   MRSA by PCR NEGATIVE NEGATIVE Final    Comment:        The GeneXpert MRSA Assay (FDA approved for NASAL specimens only), is one component of a comprehensive MRSA colonization surveillance program. It is not intended to diagnose MRSA infection nor to guide or monitor treatment for MRSA infections. Performed at [facility], [place] [address], [place], [place] [address]      Time coordinating discharge: Over 30 minutes  SIGNED:   Darliss Cheney, MD  [facility] 05/03/2020, 3:57 PM  If 7PM-7AM, please contact  night-coverage [email or web address]

## 2020-05-03 NOTE — Progress Notes (Signed)
PROGRESS NOTE    April [name]  WLN:[id number] DOB: Oct 14, 1956 DOA: 04/29/2020 PCP: [name], MD    Brief Narrative:  64 y.o. female with medical history significant of polycystic kidney disease s/p renal transplant (on prednisone, mycophenalate, prograf), COPD on 2L [place] baseline, diastolic CHF, chronic hypoxic resp failure, obesity, HTN, recurrent UTI, presented to ED with SOB wheezing and palpitations.  No fever or chills.  Chest x-ray was clear.  White cells 14.1.  She was diagnosed with acute on chronic hypoxic respiratory failure secondary to acute asthma exacerbation and she was started on IV Solu-Medrol and p.o. doxycycline.  CT chest without contrast was done which showed small bilateral pleural effusion with some atelectasis no pulmonary edema.  No infiltrate.  She was admitted under hospital service.  She also had some musculoskeletal chest pain.  Over the course of next few days, patient improved and she has no wheezes now and is on 2 L of nasal oxygen which is her baseline.   Assessment & Plan:   Active Problems:   Asthma, mild intermittent   Asthma   Decubitus ulcer of coccyx, unstageable (HCC)   Acute on chronic hypoxic respiratory failure secondary to acute asthma exacerbation Now feeling much better and oxygen requirement is 2 L which is her baseline.  Continue doxycycline, Pulmicort, albuterol and prednisone.  Chest pain -Suspect chest wall pain in setting of acute asthma exacerbation -Cont with analgesics as tolerated.  She has no chest pain.  Hyperglycemia/prediabetes -No history of diabetes.  Blood sugar significantly elevated likely due to being on steroid.  Blood sugar elevated despite of receiving 20 units of Lantus.  She already received her dose today.  We will give her 5 more units today and starting tomorrow she will be on 25 units and continue SSI.  Hx of SVT -Continue verapamil as tolerated -Correct electrolytes as needed -Continued on  tele  Hyponatremia Stable.  History of kidney transplant -Continue with home immunosuppressive regimen  Unstageable pressure injury to coccyx present on admit -WOC consulted Pressure Injury 04/07/20 Buttocks Left Stage 2 -  Partial thickness loss of dermis presenting as a shallow open injury with a red, pink wound bed without slough. (Active)  04/07/20 1730  Location: Buttocks  Location Orientation: Left  Staging: Stage 2 -  Partial thickness loss of dermis presenting as a shallow open injury with a red, pink wound bed without slough.  Wound Description (Comments):   Present on Admission: Yes     Pressure Injury 04/29/20 Coccyx Mid Stage 3 -  Full thickness tissue loss. Subcutaneous fat may be visible but bone, tendon or muscle are NOT exposed. (Active)  04/29/20 2130  Location: Coccyx  Location Orientation: Mid  Staging: Stage 3 -  Full thickness tissue loss. Subcutaneous fat may be visible but bone, tendon or muscle are NOT exposed.  Wound Description (Comments):   Present on Admission: Yes     DVT prophylaxis: Lovenox subq Code Status: Full Family Communication: No family present at bedside.  Discussed with patient herself.  Status is: Inpatient  Remains inpatient appropriate because:IV treatments appropriate due to intensity of illness or inability to take PO and Inpatient level of care appropriate due to severity of illness   Dispo: The patient is from: SNF              Anticipated d/c is to: SNF              Anticipated d/c date is: 1 to 2 days, waiting  for insurance authorization              Patient currently medically stable for DC  Consultants:   None  Procedures:   None  Antimicrobials: Anti-infectives (From admission, onward)   Start     Dose/Rate Route Frequency Ordered Stop   04/29/20 2200  valACYclovir (VALTREX) tablet 500 mg     Discontinue     500 mg Oral 2 times daily 04/29/20 1515     04/29/20 2200  doxycycline (VIBRA-TABS) tablet 100 mg      Discontinue     100 mg Oral Every 12 hours 04/29/20 1551     04/29/20 1415  ceFEPIme (MAXIPIME) 2 g in sodium chloride 0.9 % 100 mL IVPB        2 g 200 mL/hr over 30 Minutes Intravenous  Once 04/29/20 1404 04/29/20 1446      Subjective: Seen and examined.  She has no complaints.  No worsening of shortness of breath.  Objective: Vitals:   05/02/20 2044 05/02/20 2118 05/03/20 0453 05/03/20 0847  BP:  (!) 141/63 (!) 144/70 136/61  Pulse:  93 83 78  Resp:  20 19 18   Temp:  97.8 F (36.6 C) 98.2 F (36.8 C) 98.2 F (36.8 C)  TempSrc:  Oral  Oral  SpO2: 99% 99% 98% 99%  Weight:  74.4 kg    Height:        Intake/Output Summary (Last 24 hours) at 05/03/2020 1323 Last data filed at 05/03/2020 1130 Gross per 24 hour  Intake 477 ml  Output 1675 ml  Net -1198 ml   Filed Weights   04/29/20 1247 04/30/20 0121 05/02/20 2118  Weight: 70.8 kg 74.4 kg 74.4 kg    Examination:  General exam: Appears calm and comfortable  Respiratory system: Clear to auscultation. Respiratory effort normal. Cardiovascular system: S1 & S2 heard, RRR. No JVD, murmurs, rubs, gallops or clicks. No pedal edema. Gastrointestinal system: Abdomen is nondistended, soft and nontender. No organomegaly or masses felt. Normal bowel sounds heard. Central nervous system: Alert and oriented. No focal neurological deficits. Extremities: Symmetric 5 x 5 power. Skin: No rashes, lesions or ulcers.  Psychiatry: Judgement and insight appear normal. Mood & affect appropriate.   Data Reviewed: I have personally reviewed following labs and imaging studies  CBC: Recent Labs  Lab 04/29/20 1343 04/30/20 0309 05/01/20 0354 05/03/20 0500  WBC 14.1* 12.5* 8.1 6.7  NEUTROABS 13.2*  --   --  6.3  HGB 8.7* 8.5* 8.4* 7.6*  HCT 29.0* 28.1* 28.0* 25.5*  MCV 101.0* 98.9 98.9 99.6  PLT 199 189 190 818   Basic Metabolic Panel: Recent Labs  Lab 04/29/20 1343 04/30/20 0309 05/01/20 0354 05/03/20 0500  NA 130* 135 134* 134*   K 3.7 4.7 5.1 4.9  CL 96* 98 100 98  CO2 23 24 25 29   GLUCOSE 289* 139* 243* 239*  BUN 39* 50* 65* 91*  CREATININE 0.89 1.03* 1.01* 1.08*  CALCIUM 9.0 9.6 10.0 10.3   GFR: Estimated Creatinine Clearance: 49.8 mL/min (A) (by C-G formula based on SCr of 1.08 mg/dL (H)). Liver Function Tests: Recent Labs  Lab 04/29/20 1343 05/01/20 0354  AST 23 17  ALT 28 26  ALKPHOS 102 100  BILITOT 1.2 0.4  PROT 6.6 6.9  ALBUMIN 2.3* 2.3*   No results for input(s): LIPASE, AMYLASE in the last 168 hours. No results for input(s): AMMONIA in the last 168 hours. Coagulation Profile: Recent Labs  Lab 04/29/20 1343  INR 1.2   Cardiac Enzymes: No results for input(s): CKTOTAL, CKMB, CKMBINDEX, TROPONINI in the last 168 hours. BNP (last 3 results) No results for input(s): PROBNP in the last 8760 hours. HbA1C: No results for input(s): HGBA1C in the last 72 hours. CBG: Recent Labs  Lab 05/02/20 1127 05/02/20 1612 05/02/20 2119 05/03/20 0647 05/03/20 1159  GLUCAP 216* 276* 194* 186* 246*   Lipid Profile: No results for input(s): CHOL, HDL, LDLCALC, TRIG, CHOLHDL, LDLDIRECT in the last 72 hours. Thyroid Function Tests: No results for input(s): TSH, T4TOTAL, FREET4, T3FREE, THYROIDAB in the last 72 hours. Anemia Panel: No results for input(s): VITAMINB12, FOLATE, FERRITIN, TIBC, IRON, RETICCTPCT in the last 72 hours. Sepsis Labs: Recent Labs  Lab 04/29/20 1343 04/29/20 1533 04/29/20 1800 04/30/20 0309 05/01/20 0354  PROCALCITON  --   --  0.23 0.33 0.17  LATICACIDVEN 1.3 1.1  --   --   --     Recent Results (from the past 240 hour(s))  Blood Culture (routine x 2)     Status: None (Preliminary result)   Collection Time: 04/29/20  1:43 PM   Specimen: BLOOD  Result Value Ref Range Status   Specimen Description BLOOD RIGHT ANTECUBITAL  Final   Special Requests   Final    BOTTLES DRAWN AEROBIC AND ANAEROBIC Blood Culture adequate volume   Culture   Final    NO GROWTH 4  DAYS Performed at [facility], [place] [address] [place], [place] [address]    Report Status PENDING  Incomplete  Urine culture     Status: None   Collection Time: 04/29/20  1:50 PM   Specimen: In/Out Cath Urine  Result Value Ref Range Status   Specimen Description IN/OUT CATH URINE  Final   Special Requests NONE  Final   Culture   Final    NO GROWTH Performed at [facility], [place] [address], [place], [place] [address]    Report Status 04/30/2020 FINAL  Final  Blood Culture (routine x 2)     Status: None (Preliminary result)   Collection Time: 04/29/20  2:09 PM   Specimen: BLOOD RIGHT FOREARM  Result Value Ref Range Status   Specimen Description BLOOD RIGHT FOREARM  Final   Special Requests   Final    BOTTLES DRAWN AEROBIC AND ANAEROBIC Blood Culture adequate volume   Culture   Final    NO GROWTH 4 DAYS Performed at [facility], [place] [address], [place], [place] [address]    Report Status PENDING  Incomplete  SARS Coronavirus 2 by RT PCR (hospital order, performed in [facility] hospital lab) Nasopharyngeal Nasopharyngeal Swab     Status: None   Collection Time: 04/29/20  3:20 PM   Specimen: Nasopharyngeal Swab  Result Value Ref Range Status   SARS Coronavirus 2 NEGATIVE NEGATIVE Final    Comment: (NOTE) SARS-CoV-2 target nucleic acids are NOT DETECTED. The SARS-CoV-2 RNA is generally detectable in upper and lower respiratory specimens during the acute phase of infection. The lowest concentration of SARS-CoV-2 viral copies this assay can detect is 250 copies / mL. A negative result does not preclude SARS-CoV-2 infection and should not be used as the sole basis for treatment or other patient management decisions.  A negative result may occur with improper specimen collection / handling, submission of specimen other than nasopharyngeal swab, presence of viral mutation(s) within the areas targeted by this assay, and inadequate number of viral copies (<250  copies / mL).  A negative result must be combined with clinical observations, patient history, and epidemiological information. Fact Sheet for Patients:   StrictlyIdeas.no Fact Sheet for Healthcare Providers: BankingDealers.co.za This test is not yet approved or cleared  by the [place] FDA and has been authorized for detection and/or diagnosis of SARS-CoV-2 by FDA under an Emergency Use Authorization (EUA).  This EUA will remain in effect (meaning this test can be used) for the duration of the COVID-19 declaration under Section 564(b)(1) of the Act, 21 U.S.C. section 360bbb-3(b)(1), unless the authorization is terminated or revoked sooner. Performed at [facility], [place] [address], [place], [place] [address]   MRSA PCR Screening     Status: None   Collection Time: 04/30/20  6:10 AM   Specimen: Nasal Mucosa; Nasopharyngeal  Result Value Ref Range Status   MRSA by PCR NEGATIVE NEGATIVE Final    Comment:        The GeneXpert MRSA Assay (FDA approved for NASAL specimens only), is one component of a comprehensive MRSA colonization surveillance program. It is not intended to diagnose MRSA infection nor to guide or monitor treatment for MRSA infections. Performed at [facility], [place] [address] [place], [place] [address]      Radiology Studies: No results found.  Scheduled Meds: . allopurinol  300 mg Oral Daily  . ascorbic acid  500 mg Oral Daily  . aspirin  81 mg Oral Daily  . brimonidine  1 drop Both Eyes BID  . budesonide  0.5 mg Nebulization BID  . cholecalciferol  1,000 Units Oral Daily  . collagenase   Topical Daily  . cycloSPORINE  1 drop Both Eyes BID  . docusate sodium  100 mg Oral BID  . dorzolamide  1 drop Both Eyes Daily  . doxycycline  100 mg Oral Q12H  . enoxaparin (LOVENOX) injection  40 mg Subcutaneous Q24H  . feeding supplement (ENSURE ENLIVE)  237 mL Oral TID BM  . feeding supplement  (PRO-STAT SUGAR FREE 64)  30 mL Oral BID WC  . ferrous sulfate  325 mg Oral Q breakfast  . FLUoxetine  40 mg Oral Daily  . fluticasone furoate-vilanterol  1 puff Inhalation Daily  . furosemide  40 mg Oral Daily  . gabapentin  200 mg Oral TID  . insulin aspart  0-9 Units Subcutaneous TID WC  . insulin glargine  20 Units Subcutaneous Daily  . ipratropium-albuterol  3 mL Nebulization BID  . latanoprost  1 drop Both Eyes QHS  . magnesium oxide  400 mg Oral BID  . multivitamin with minerals  1 tablet Oral Daily  . mycophenolate  180 mg Oral BID  . nutrition supplement (JUVEN)  1 packet Oral BID BM  . pantoprazole  40 mg Oral BID  . predniSONE  20 mg Oral BID WC  . tacrolimus  0.5 mg Oral QHS  . tacrolimus  1 mg Oral Daily  . valACYclovir  500 mg Oral BID  . verapamil  240 mg Oral Daily  . vitamin B-12  1,000 mcg Oral Daily   Continuous Infusions:   LOS: 3 days   Darliss Cheney, MD [facility] Pager On Amion  If 7PM-7AM, please contact night-coverage 05/03/2020, 1:23 PM

## 2020-05-03 NOTE — TOC Transition Note (Signed)
Transition of Care [facility]) - CM/SW Discharge Note   Patient Details  Name: April [name] MRN: [id number] Date of Birth: August 15, 1956  Transition of Care [facility]) CM/SW Contact:  Sable Feil, LCSW Phone Number: 05/03/2020, 4:35 PM   Clinical Narrative: Patient medically stable for discharge and insurance authorization received this after from [place]: [id number] for 5 days eff. 6/10. Next review date 6/14. Case manager Cristobal Goldmann and fax number for continued stay clinicals [phone].  Call made to [facility], admissions director at [facility] and advised her of insurance authorization. They can take patient today, pending receiving the discharge summary as soon as possible. MD contacted regarding receipt of insurance auth and need for d/c summary ASAP. Discharge summary transmitted to facility and 6/7 negative COVID accepted, and copy sent with patient in discharge packet. Daughter contacted via HIPPA compliant text after not being able to reach her by phone regarding her mother's discharge. [name] responded and thanked CSW for the information.    Final next level of care: [facility]) Barriers to Discharge: Barriers Resolved (Received insurance auth)   Patient Goals and CMS Choice Patient states their goals for this hospitalization and ongoing recovery are:: Patient stated her intent to return to [facility] to continue her rehab CMS Medicare.gov Compare Post Acute Care list provided to:: Other (Comment Required) (Not needed as patient returning to [facility]) Choice offered to / list presented to : NA  Discharge Placement   Existing PASRR number confirmed : 05/01/20          Patient [facility] bed at: [facility] Patient to be transferred to facility by: [facility] Name of family member notified: [name] Patient and family notified of of transfer: 05/03/20  Discharge Plan and Services In-house  Referral: Clinical Social Work   Post Acute Care Choice: [place]                               Social Determinants of Health (SDOH) Interventions  No SDOH interventions requested or needed at discharge.   Readmission Risk Interventions Readmission Risk Prevention Plan 04/19/2020 02/09/2020 04/20/2019  Transportation Screening Complete Complete Complete  PCP or Specialist Appt within 3-5 Days - - Not Complete  Not Complete comments - - not yet ready for d/c  HRI or [place] - - Not Complete  HRI or Home Care Consult comments - - no needs identified  Social Work Scientific laboratory technician for [place] Planning/Counseling - - Not Complete  SW consult not completed comments - - no needs identified  Palliative Care Screening - - Not Applicable  Medication Review (RN Care Manager) Complete Referral to Pharmacy Complete  PCP or Specialist appointment within 3-5 days of discharge Complete Complete -  [place] or Home Care Consult Complete Complete -  SW Recovery Care/Counseling Consult Complete - -  Palliative Care Screening Not Applicable Complete -  [place] Complete Patient Refused -  Some recent data might be hidden

## 2020-05-04 LAB — CULTURE, BLOOD (ROUTINE X 2)
Culture: NO GROWTH
Culture: NO GROWTH
Special Requests: ADEQUATE
Special Requests: ADEQUATE

## 2020-05-13 ENCOUNTER — Inpatient Hospital Stay (HOSPITAL_COMMUNITY)
Admission: EM | Admit: 2020-05-13 | Discharge: 2020-05-22 | DRG: 871 | Disposition: A | Discharge status: Skilled Nursing Facility | Payer: Medicare Other | Attending: Internal Medicine | Admitting: Internal Medicine

## 2020-05-13 ENCOUNTER — Other Ambulatory Visit: Payer: Self-pay

## 2020-05-13 ENCOUNTER — Emergency Department (HOSPITAL_COMMUNITY): Payer: Medicare Other

## 2020-05-13 ENCOUNTER — Inpatient Hospital Stay (HOSPITAL_COMMUNITY): Payer: Medicare Other

## 2020-05-13 ENCOUNTER — Encounter (HOSPITAL_COMMUNITY): Payer: Self-pay

## 2020-05-13 DIAGNOSIS — K219 Gastro-esophageal reflux disease without esophagitis: Secondary | ICD-10-CM | POA: Diagnosis present

## 2020-05-13 DIAGNOSIS — A419 Sepsis, unspecified organism: Principal | ICD-10-CM | POA: Diagnosis present

## 2020-05-13 DIAGNOSIS — E871 Hypo-osmolality and hyponatremia: Secondary | ICD-10-CM | POA: Diagnosis not present

## 2020-05-13 DIAGNOSIS — I2510 Atherosclerotic heart disease of native coronary artery without angina pectoris: Secondary | ICD-10-CM | POA: Diagnosis present

## 2020-05-13 DIAGNOSIS — E785 Hyperlipidemia, unspecified: Secondary | ICD-10-CM | POA: Diagnosis present

## 2020-05-13 DIAGNOSIS — Z6831 Body mass index (BMI) 31.0-31.9, adult: Secondary | ICD-10-CM | POA: Diagnosis not present

## 2020-05-13 DIAGNOSIS — E876 Hypokalemia: Secondary | ICD-10-CM | POA: Diagnosis not present

## 2020-05-13 DIAGNOSIS — J449 Chronic obstructive pulmonary disease, unspecified: Secondary | ICD-10-CM | POA: Diagnosis not present

## 2020-05-13 DIAGNOSIS — I110 Hypertensive heart disease with heart failure: Secondary | ICD-10-CM | POA: Diagnosis present

## 2020-05-13 DIAGNOSIS — Z8249 Family history of ischemic heart disease and other diseases of the circulatory system: Secondary | ICD-10-CM

## 2020-05-13 DIAGNOSIS — J441 Chronic obstructive pulmonary disease with (acute) exacerbation: Secondary | ICD-10-CM | POA: Diagnosis present

## 2020-05-13 DIAGNOSIS — R509 Fever, unspecified: Secondary | ICD-10-CM | POA: Diagnosis not present

## 2020-05-13 DIAGNOSIS — J9811 Atelectasis: Secondary | ICD-10-CM | POA: Diagnosis present

## 2020-05-13 DIAGNOSIS — J189 Pneumonia, unspecified organism: Secondary | ICD-10-CM

## 2020-05-13 DIAGNOSIS — E669 Obesity, unspecified: Secondary | ICD-10-CM | POA: Diagnosis present

## 2020-05-13 DIAGNOSIS — Z888 Allergy status to other drugs, medicaments and biological substances status: Secondary | ICD-10-CM

## 2020-05-13 DIAGNOSIS — Z940 Kidney transplant status: Secondary | ICD-10-CM | POA: Diagnosis not present

## 2020-05-13 DIAGNOSIS — I82409 Acute embolism and thrombosis of unspecified deep veins of unspecified lower extremity: Secondary | ICD-10-CM | POA: Diagnosis not present

## 2020-05-13 DIAGNOSIS — A630 Anogenital (venereal) warts: Secondary | ICD-10-CM | POA: Diagnosis present

## 2020-05-13 DIAGNOSIS — Z794 Long term (current) use of insulin: Secondary | ICD-10-CM

## 2020-05-13 DIAGNOSIS — I471 Supraventricular tachycardia: Secondary | ICD-10-CM | POA: Diagnosis present

## 2020-05-13 DIAGNOSIS — N179 Acute kidney failure, unspecified: Secondary | ICD-10-CM | POA: Diagnosis not present

## 2020-05-13 DIAGNOSIS — I5032 Chronic diastolic (congestive) heart failure: Secondary | ICD-10-CM | POA: Diagnosis present

## 2020-05-13 DIAGNOSIS — J4520 Mild intermittent asthma, uncomplicated: Secondary | ICD-10-CM | POA: Diagnosis present

## 2020-05-13 DIAGNOSIS — Z7952 Long term (current) use of systemic steroids: Secondary | ICD-10-CM

## 2020-05-13 DIAGNOSIS — D84821 Immunodeficiency due to drugs: Secondary | ICD-10-CM | POA: Diagnosis present

## 2020-05-13 DIAGNOSIS — F329 Major depressive disorder, single episode, unspecified: Secondary | ICD-10-CM | POA: Diagnosis present

## 2020-05-13 DIAGNOSIS — D509 Iron deficiency anemia, unspecified: Secondary | ICD-10-CM | POA: Diagnosis present

## 2020-05-13 DIAGNOSIS — E860 Dehydration: Secondary | ICD-10-CM | POA: Diagnosis not present

## 2020-05-13 DIAGNOSIS — I272 Pulmonary hypertension, unspecified: Secondary | ICD-10-CM | POA: Diagnosis present

## 2020-05-13 DIAGNOSIS — N390 Urinary tract infection, site not specified: Secondary | ICD-10-CM | POA: Diagnosis present

## 2020-05-13 DIAGNOSIS — Z8271 Family history of polycystic kidney: Secondary | ICD-10-CM

## 2020-05-13 DIAGNOSIS — R0602 Shortness of breath: Secondary | ICD-10-CM

## 2020-05-13 DIAGNOSIS — Z20822 Contact with and (suspected) exposure to covid-19: Secondary | ICD-10-CM | POA: Diagnosis present

## 2020-05-13 DIAGNOSIS — Z87891 Personal history of nicotine dependence: Secondary | ICD-10-CM | POA: Diagnosis not present

## 2020-05-13 DIAGNOSIS — J9621 Acute and chronic respiratory failure with hypoxia: Secondary | ICD-10-CM | POA: Diagnosis present

## 2020-05-13 DIAGNOSIS — J440 Chronic obstructive pulmonary disease with acute lower respiratory infection: Secondary | ICD-10-CM | POA: Diagnosis present

## 2020-05-13 DIAGNOSIS — Z87440 Personal history of urinary (tract) infections: Secondary | ICD-10-CM

## 2020-05-13 DIAGNOSIS — Z79899 Other long term (current) drug therapy: Secondary | ICD-10-CM

## 2020-05-13 DIAGNOSIS — E213 Hyperparathyroidism, unspecified: Secondary | ICD-10-CM | POA: Diagnosis present

## 2020-05-13 DIAGNOSIS — J9622 Acute and chronic respiratory failure with hypercapnia: Secondary | ICD-10-CM | POA: Diagnosis not present

## 2020-05-13 DIAGNOSIS — R230 Cyanosis: Secondary | ICD-10-CM | POA: Diagnosis present

## 2020-05-13 DIAGNOSIS — L89150 Pressure ulcer of sacral region, unstageable: Secondary | ICD-10-CM | POA: Diagnosis present

## 2020-05-13 DIAGNOSIS — I959 Hypotension, unspecified: Secondary | ICD-10-CM | POA: Diagnosis present

## 2020-05-13 DIAGNOSIS — Z7951 Long term (current) use of inhaled steroids: Secondary | ICD-10-CM

## 2020-05-13 DIAGNOSIS — K5909 Other constipation: Secondary | ICD-10-CM | POA: Diagnosis present

## 2020-05-13 DIAGNOSIS — K5730 Diverticulosis of large intestine without perforation or abscess without bleeding: Secondary | ICD-10-CM | POA: Diagnosis present

## 2020-05-13 DIAGNOSIS — D849 Immunodeficiency, unspecified: Secondary | ICD-10-CM | POA: Diagnosis not present

## 2020-05-13 DIAGNOSIS — Z96642 Presence of left artificial hip joint: Secondary | ICD-10-CM | POA: Diagnosis present

## 2020-05-13 LAB — URINALYSIS, ROUTINE W REFLEX MICROSCOPIC
Bacteria, UA: NONE SEEN
Bilirubin Urine: NEGATIVE
Glucose, UA: NEGATIVE mg/dL
Ketones, ur: NEGATIVE mg/dL
Leukocytes,Ua: NEGATIVE
Nitrite: NEGATIVE
Protein, ur: 100 mg/dL — AB
Specific Gravity, Urine: 1.018 (ref 1.005–1.030)
pH: 5 (ref 5.0–8.0)

## 2020-05-13 LAB — CBC WITH DIFFERENTIAL/PLATELET
Abs Immature Granulocytes: 0.15 10*3/uL — ABNORMAL HIGH (ref 0.00–0.07)
Basophils Absolute: 0 10*3/uL (ref 0.0–0.1)
Basophils Relative: 0 %
Eosinophils Absolute: 0.1 10*3/uL (ref 0.0–0.5)
Eosinophils Relative: 0 %
HCT: 32.4 % — ABNORMAL LOW (ref 36.0–46.0)
Hemoglobin: 9.5 g/dL — ABNORMAL LOW (ref 12.0–15.0)
Immature Granulocytes: 1 %
Lymphocytes Relative: 6 %
Lymphs Abs: 1.2 10*3/uL (ref 0.7–4.0)
MCH: 30.4 pg (ref 26.0–34.0)
MCHC: 29.3 g/dL — ABNORMAL LOW (ref 30.0–36.0)
MCV: 103.8 fL — ABNORMAL HIGH (ref 80.0–100.0)
Monocytes Absolute: 0.6 10*3/uL (ref 0.1–1.0)
Monocytes Relative: 3 %
Neutro Abs: 16.8 10*3/uL — ABNORMAL HIGH (ref 1.7–7.7)
Neutrophils Relative %: 90 %
Platelets: 236 10*3/uL (ref 150–400)
RBC: 3.12 MIL/uL — ABNORMAL LOW (ref 3.87–5.11)
RDW: 23.3 % — ABNORMAL HIGH (ref 11.5–15.5)
WBC: 18.8 10*3/uL — ABNORMAL HIGH (ref 4.0–10.5)
nRBC: 0 % (ref 0.0–0.2)

## 2020-05-13 LAB — APTT: aPTT: 36 seconds (ref 24–36)

## 2020-05-13 LAB — I-STAT ARTERIAL BLOOD GAS, ED
Acid-Base Excess: 3 mmol/L — ABNORMAL HIGH (ref 0.0–2.0)
Bicarbonate: 28.9 mmol/L — ABNORMAL HIGH (ref 20.0–28.0)
Calcium, Ion: 1.27 mmol/L (ref 1.15–1.40)
HCT: 37 % (ref 36.0–46.0)
Hemoglobin: 12.6 g/dL (ref 12.0–15.0)
O2 Saturation: 99 %
Patient temperature: 102.4
Potassium: 4.1 mmol/L (ref 3.5–5.1)
Sodium: 139 mmol/L (ref 135–145)
TCO2: 30 mmol/L (ref 22–32)
pCO2 arterial: 54.5 mmHg — ABNORMAL HIGH (ref 32.0–48.0)
pH, Arterial: 7.341 — ABNORMAL LOW (ref 7.350–7.450)
pO2, Arterial: 154 mmHg — ABNORMAL HIGH (ref 83.0–108.0)

## 2020-05-13 LAB — COMPREHENSIVE METABOLIC PANEL
ALT: 18 U/L (ref 0–44)
AST: 22 U/L (ref 15–41)
Albumin: 2.8 g/dL — ABNORMAL LOW (ref 3.5–5.0)
Alkaline Phosphatase: 94 U/L (ref 38–126)
Anion gap: 10 (ref 5–15)
BUN: 22 mg/dL (ref 8–23)
CO2: 25 mmol/L (ref 22–32)
Calcium: 9.6 mg/dL (ref 8.9–10.3)
Chloride: 101 mmol/L (ref 98–111)
Creatinine, Ser: 0.9 mg/dL (ref 0.44–1.00)
GFR calc Af Amer: >60 mL/min (ref >60)
GFR calc non Af Amer: >60 mL/min (ref >60)
Glucose, Bld: 129 mg/dL — ABNORMAL HIGH (ref 70–99)
Potassium: 4.3 mmol/L (ref 3.5–5.1)
Sodium: 136 mmol/L (ref 135–145)
Total Bilirubin: 1.3 mg/dL — ABNORMAL HIGH (ref 0.3–1.2)
Total Protein: 7.1 g/dL (ref 6.5–8.1)

## 2020-05-13 LAB — TROPONIN I (HIGH SENSITIVITY)
Troponin I (High Sensitivity): 12 ng/L (ref <18)
Troponin I (High Sensitivity): 11 ng/L (ref <18)

## 2020-05-13 LAB — CBG MONITORING, ED
Glucose-Capillary: 79 mg/dL (ref 70–99)
Glucose-Capillary: 74 mg/dL (ref 70–99)

## 2020-05-13 LAB — LACTIC ACID, PLASMA: Lactic Acid, Venous: 0.9 mmol/L (ref 0.5–1.9)

## 2020-05-13 LAB — PROTIME-INR
INR: 1.2 (ref 0.8–1.2)
Prothrombin Time: 15 seconds (ref 11.4–15.2)

## 2020-05-13 LAB — SARS CORONAVIRUS 2 BY RT PCR (HOSPITAL ORDER, PERFORMED IN ~~LOC~~ HOSPITAL LAB): SARS Coronavirus 2: NEGATIVE

## 2020-05-13 LAB — BRAIN NATRIURETIC PEPTIDE: B Natriuretic Peptide: 1176 pg/mL — ABNORMAL HIGH (ref 0.0–100.0)

## 2020-05-13 LAB — PROCALCITONIN: Procalcitonin: 3.84 ng/mL

## 2020-05-13 MED ORDER — DOCUSATE SODIUM 100 MG PO CAPS
100.0000 mg | ORAL_CAPSULE | Freq: Two times a day (BID) | ORAL | Status: DC
  Administered 2020-05-13 – 2020-05-22 (×16): 100 mg via ORAL
  Filled 2020-05-13 (×19): qty 1

## 2020-05-13 MED ORDER — PRO-STAT SUGAR FREE PO LIQD
30.0000 mL | Freq: Two times a day (BID) | ORAL | Status: DC
  Administered 2020-05-14 – 2020-05-22 (×17): 30 mL via ORAL
  Filled 2020-05-13 (×19): qty 30

## 2020-05-13 MED ORDER — ONDANSETRON HCL 4 MG PO TABS: 4.0000 mg | ORAL_TABLET | Freq: Four times a day (QID) | ORAL | Status: DC | PRN

## 2020-05-13 MED ORDER — ALLOPURINOL 300 MG PO TABS
300.0000 mg | ORAL_TABLET | Freq: Every day | ORAL | Status: DC
  Administered 2020-05-13 – 2020-05-22 (×10): 300 mg via ORAL
  Filled 2020-05-13 (×11): qty 1

## 2020-05-13 MED ORDER — VANCOMYCIN HCL IN DEXTROSE 1-5 GM/200ML-% IV SOLN: 1000.0000 mg | Freq: Once | INTRAVENOUS | Status: DC

## 2020-05-13 MED ORDER — FLUOXETINE HCL 20 MG PO CAPS
40.0000 mg | ORAL_CAPSULE | Freq: Every day | ORAL | Status: DC
  Administered 2020-05-13 – 2020-05-22 (×10): 40 mg via ORAL
  Filled 2020-05-13 (×10): qty 2

## 2020-05-13 MED ORDER — LATANOPROST 0.005 % OP SOLN
1.0000 [drp] | Freq: Every day | OPHTHALMIC | Status: DC
  Administered 2020-05-14 – 2020-05-21 (×7): 1 [drp] via OPHTHALMIC
  Filled 2020-05-13 (×2): qty 2.5

## 2020-05-13 MED ORDER — CYCLOSPORINE 0.05 % OP EMUL
1.0000 [drp] | Freq: Two times a day (BID) | OPHTHALMIC | Status: DC
  Administered 2020-05-14 – 2020-05-22 (×17): 1 [drp] via OPHTHALMIC
  Filled 2020-05-13 (×20): qty 1

## 2020-05-13 MED ORDER — ACETAMINOPHEN 325 MG PO TABS
650.0000 mg | ORAL_TABLET | Freq: Four times a day (QID) | ORAL | Status: DC | PRN
  Administered 2020-05-18 – 2020-05-21 (×5): 650 mg via ORAL
  Filled 2020-05-13 (×5): qty 2

## 2020-05-13 MED ORDER — MAGNESIUM OXIDE 400 (241.3 MG) MG PO TABS
400.0000 mg | ORAL_TABLET | Freq: Two times a day (BID) | ORAL | Status: DC
  Administered 2020-05-13 – 2020-05-22 (×18): 400 mg via ORAL
  Filled 2020-05-13 (×21): qty 1

## 2020-05-13 MED ORDER — SODIUM CHLORIDE 0.9 % IV SOLN: INTRAVENOUS | Status: DC

## 2020-05-13 MED ORDER — METRONIDAZOLE IN NACL 5-0.79 MG/ML-% IV SOLN
500.0000 mg | Freq: Once | INTRAVENOUS | Status: AC
  Administered 2020-05-13: 500 mg via INTRAVENOUS
  Filled 2020-05-13: qty 100

## 2020-05-13 MED ORDER — COLLAGENASE 250 UNIT/GM EX OINT
1.0000 "application " | TOPICAL_OINTMENT | Freq: Every day | CUTANEOUS | Status: DC
  Administered 2020-05-14 – 2020-05-21 (×8): 1 via TOPICAL
  Filled 2020-05-13: qty 30

## 2020-05-13 MED ORDER — VITAMIN B-12 1000 MCG PO TABS
1000.0000 ug | ORAL_TABLET | Freq: Every day | ORAL | Status: DC
  Administered 2020-05-13 – 2020-05-22 (×10): 1000 ug via ORAL
  Filled 2020-05-13 (×10): qty 1

## 2020-05-13 MED ORDER — ACETAMINOPHEN 500 MG PO TABS
1000.0000 mg | ORAL_TABLET | Freq: Once | ORAL | Status: AC
  Administered 2020-05-13: 1000 mg via ORAL
  Filled 2020-05-13: qty 2

## 2020-05-13 MED ORDER — IPRATROPIUM BROMIDE 0.02 % IN SOLN: 0.5000 mg | Freq: Four times a day (QID) | RESPIRATORY_TRACT | Status: DC | PRN

## 2020-05-13 MED ORDER — TACROLIMUS 1 MG PO CAPS
1.0000 mg | ORAL_CAPSULE | Freq: Every day | ORAL | Status: DC
  Administered 2020-05-13 – 2020-05-22 (×10): 1 mg via ORAL
  Filled 2020-05-13 (×11): qty 1

## 2020-05-13 MED ORDER — SODIUM CHLORIDE 0.9 % IV SOLN
2.0000 g | Freq: Once | INTRAVENOUS | Status: AC
  Administered 2020-05-13: 2 g via INTRAVENOUS
  Filled 2020-05-13: qty 2

## 2020-05-13 MED ORDER — ACETAMINOPHEN 650 MG RE SUPP: 650.0000 mg | Freq: Four times a day (QID) | RECTAL | Status: DC | PRN

## 2020-05-13 MED ORDER — ENOXAPARIN SODIUM 40 MG/0.4ML ~~LOC~~ SOLN
40.0000 mg | SUBCUTANEOUS | Status: DC
  Administered 2020-05-13: 40 mg via SUBCUTANEOUS
  Filled 2020-05-13: qty 0.4

## 2020-05-13 MED ORDER — VITAMIN D 25 MCG (1000 UNIT) PO TABS
1000.0000 [IU] | ORAL_TABLET | Freq: Every day | ORAL | Status: DC
  Administered 2020-05-13 – 2020-05-22 (×10): 1000 [IU] via ORAL
  Filled 2020-05-13 (×10): qty 1

## 2020-05-13 MED ORDER — IPRATROPIUM BROMIDE 0.02 % IN SOLN: 0.5000 mg | Freq: Four times a day (QID) | RESPIRATORY_TRACT | Status: DC

## 2020-05-13 MED ORDER — ADULT MULTIVITAMIN W/MINERALS CH
1.0000 | ORAL_TABLET | Freq: Every day | ORAL | Status: DC
  Administered 2020-05-13 – 2020-05-22 (×10): 1 via ORAL
  Filled 2020-05-13 (×12): qty 1

## 2020-05-13 MED ORDER — DORZOLAMIDE HCL 2 % OP SOLN
1.0000 [drp] | Freq: Every day | OPHTHALMIC | Status: DC
  Administered 2020-05-14 – 2020-05-22 (×10): 1 [drp] via OPHTHALMIC
  Filled 2020-05-13: qty 10

## 2020-05-13 MED ORDER — IPRATROPIUM BROMIDE HFA 17 MCG/ACT IN AERS
4.0000 | INHALATION_SPRAY | Freq: Once | RESPIRATORY_TRACT | Status: AC
  Administered 2020-05-13: 4 via RESPIRATORY_TRACT
  Filled 2020-05-13: qty 12.9

## 2020-05-13 MED ORDER — INSULIN ASPART 100 UNIT/ML ~~LOC~~ SOLN
0.0000 [IU] | Freq: Three times a day (TID) | SUBCUTANEOUS | Status: DC
  Administered 2020-05-14: 1 [IU] via SUBCUTANEOUS
  Administered 2020-05-14: 9 [IU] via SUBCUTANEOUS
  Administered 2020-05-14 – 2020-05-15 (×2): 3 [IU] via SUBCUTANEOUS
  Administered 2020-05-15: 2 [IU] via SUBCUTANEOUS
  Administered 2020-05-15 – 2020-05-16 (×2): 3 [IU] via SUBCUTANEOUS
  Administered 2020-05-16: 5 [IU] via SUBCUTANEOUS
  Administered 2020-05-16 – 2020-05-17 (×2): 3 [IU] via SUBCUTANEOUS
  Administered 2020-05-17: 2 [IU] via SUBCUTANEOUS
  Administered 2020-05-18 (×2): 3 [IU] via SUBCUTANEOUS
  Administered 2020-05-19 – 2020-05-20 (×2): 2 [IU] via SUBCUTANEOUS
  Administered 2020-05-20: 3 [IU] via SUBCUTANEOUS
  Administered 2020-05-21: 2 [IU] via SUBCUTANEOUS

## 2020-05-13 MED ORDER — METHYLPREDNISOLONE SODIUM SUCC 40 MG IJ SOLR
40.0000 mg | Freq: Two times a day (BID) | INTRAMUSCULAR | Status: DC
  Administered 2020-05-14: 40 mg via INTRAVENOUS
  Filled 2020-05-13: qty 1

## 2020-05-13 MED ORDER — ASPIRIN 81 MG PO CHEW
81.0000 mg | CHEWABLE_TABLET | Freq: Every day | ORAL | Status: DC
  Administered 2020-05-13 – 2020-05-14 (×2): 81 mg via ORAL
  Filled 2020-05-13 (×2): qty 1

## 2020-05-13 MED ORDER — ASCORBIC ACID 500 MG PO TABS
500.0000 mg | ORAL_TABLET | Freq: Every day | ORAL | Status: DC
  Administered 2020-05-13 – 2020-05-22 (×10): 500 mg via ORAL
  Filled 2020-05-13 (×10): qty 1

## 2020-05-13 MED ORDER — FLUTICASONE FUROATE-VILANTEROL 200-25 MCG/INH IN AEPB: 1.0000 | INHALATION_SPRAY | Freq: Every day | RESPIRATORY_TRACT | Status: DC

## 2020-05-13 MED ORDER — VERAPAMIL HCL ER 240 MG PO TBCR
240.0000 mg | EXTENDED_RELEASE_TABLET | Freq: Every day | ORAL | Status: DC
  Administered 2020-05-13: 240 mg via ORAL
  Filled 2020-05-13 (×2): qty 1

## 2020-05-13 MED ORDER — PANTOPRAZOLE SODIUM 40 MG PO TBEC
40.0000 mg | DELAYED_RELEASE_TABLET | Freq: Two times a day (BID) | ORAL | Status: DC
  Administered 2020-05-13 – 2020-05-15 (×4): 40 mg via ORAL
  Filled 2020-05-13 (×4): qty 1

## 2020-05-13 MED ORDER — BUDESONIDE 0.5 MG/2ML IN SUSP
0.5000 mg | Freq: Two times a day (BID) | RESPIRATORY_TRACT | Status: DC
  Filled 2020-05-13 (×2): qty 2

## 2020-05-13 MED ORDER — SODIUM CHLORIDE 0.9 % IV SOLN
2.0000 g | Freq: Two times a day (BID) | INTRAVENOUS | Status: AC
  Administered 2020-05-14 – 2020-05-17 (×8): 2 g via INTRAVENOUS
  Filled 2020-05-13 (×8): qty 2

## 2020-05-13 MED ORDER — BRIMONIDINE TARTRATE 0.2 % OP SOLN
1.0000 [drp] | Freq: Two times a day (BID) | OPHTHALMIC | Status: DC
  Administered 2020-05-14 – 2020-05-22 (×17): 1 [drp] via OPHTHALMIC
  Filled 2020-05-13: qty 5

## 2020-05-13 MED ORDER — METHYLPREDNISOLONE SODIUM SUCC 40 MG IJ SOLR: 40.0000 mg | Freq: Four times a day (QID) | INTRAMUSCULAR | Status: DC

## 2020-05-13 MED ORDER — VANCOMYCIN HCL 1500 MG/300ML IV SOLN
1500.0000 mg | Freq: Once | INTRAVENOUS | Status: AC
  Administered 2020-05-13: 1500 mg via INTRAVENOUS
  Filled 2020-05-13: qty 300

## 2020-05-13 MED ORDER — GUAIFENESIN ER 600 MG PO TB12
600.0000 mg | ORAL_TABLET | Freq: Two times a day (BID) | ORAL | Status: DC
  Administered 2020-05-13 – 2020-05-22 (×19): 600 mg via ORAL
  Filled 2020-05-13 (×20): qty 1

## 2020-05-13 MED ORDER — ONDANSETRON HCL 4 MG/2ML IJ SOLN
4.0000 mg | Freq: Four times a day (QID) | INTRAMUSCULAR | Status: DC | PRN
  Administered 2020-05-13: 4 mg via INTRAVENOUS
  Filled 2020-05-13: qty 2

## 2020-05-13 MED ORDER — SENNA-DOCUSATE SODIUM 8.6-50 MG PO TABS: 1.0000 | ORAL_TABLET | Freq: Every day | ORAL | Status: DC

## 2020-05-13 MED ORDER — IPRATROPIUM-ALBUTEROL 0.5-2.5 (3) MG/3ML IN SOLN
3.0000 mL | RESPIRATORY_TRACT | Status: DC
  Administered 2020-05-13 – 2020-05-14 (×8): 3 mL via RESPIRATORY_TRACT
  Filled 2020-05-13 (×7): qty 3

## 2020-05-13 MED ORDER — HYDROXYZINE HCL 10 MG PO TABS
10.0000 mg | ORAL_TABLET | Freq: Two times a day (BID) | ORAL | Status: DC | PRN
  Filled 2020-05-13 (×2): qty 1

## 2020-05-13 MED ORDER — ENSURE ENLIVE PO LIQD
237.0000 mL | Freq: Two times a day (BID) | ORAL | Status: DC
  Administered 2020-05-13 – 2020-05-22 (×17): 237 mL via ORAL
  Filled 2020-05-13: qty 237

## 2020-05-13 MED ORDER — LEVALBUTEROL HCL 0.63 MG/3ML IN NEBU
0.6300 mg | INHALATION_SOLUTION | Freq: Four times a day (QID) | RESPIRATORY_TRACT | Status: DC | PRN
  Administered 2020-05-14: 0.63 mg via RESPIRATORY_TRACT
  Filled 2020-05-13: qty 3

## 2020-05-13 MED ORDER — TACROLIMUS 0.5 MG PO CAPS
0.5000 mg | ORAL_CAPSULE | Freq: Every day | ORAL | Status: DC
  Administered 2020-05-13 – 2020-05-21 (×9): 0.5 mg via ORAL
  Filled 2020-05-13 (×10): qty 1

## 2020-05-13 MED ORDER — FUROSEMIDE 10 MG/ML IJ SOLN
40.0000 mg | Freq: Once | INTRAMUSCULAR | Status: AC
  Administered 2020-05-13: 40 mg via INTRAVENOUS
  Filled 2020-05-13: qty 4

## 2020-05-13 MED ORDER — VANCOMYCIN HCL 750 MG/150ML IV SOLN
750.0000 mg | Freq: Two times a day (BID) | INTRAVENOUS | Status: DC
  Administered 2020-05-14 – 2020-05-15 (×4): 750 mg via INTRAVENOUS
  Filled 2020-05-13 (×5): qty 150

## 2020-05-13 MED ORDER — SENNOSIDES-DOCUSATE SODIUM 8.6-50 MG PO TABS
1.0000 | ORAL_TABLET | Freq: Every day | ORAL | Status: DC
  Administered 2020-05-13 – 2020-05-22 (×9): 1 via ORAL
  Filled 2020-05-13 (×10): qty 1

## 2020-05-13 MED ORDER — MYCOPHENOLATE SODIUM 180 MG PO TBEC
180.0000 mg | DELAYED_RELEASE_TABLET | Freq: Two times a day (BID) | ORAL | Status: DC
  Administered 2020-05-13 – 2020-05-14 (×3): 180 mg via ORAL
  Filled 2020-05-13 (×3): qty 1

## 2020-05-13 MED ORDER — VALACYCLOVIR HCL 500 MG PO TABS
500.0000 mg | ORAL_TABLET | Freq: Two times a day (BID) | ORAL | Status: DC
  Administered 2020-05-13 – 2020-05-22 (×19): 500 mg via ORAL
  Filled 2020-05-13 (×20): qty 1

## 2020-05-13 MED ORDER — FERROUS SULFATE 325 (65 FE) MG PO TABS
325.0000 mg | ORAL_TABLET | Freq: Every day | ORAL | Status: DC
  Administered 2020-05-14: 325 mg via ORAL
  Filled 2020-05-13: qty 1

## 2020-05-13 MED ORDER — GABAPENTIN 100 MG PO CAPS
200.0000 mg | ORAL_CAPSULE | Freq: Three times a day (TID) | ORAL | Status: DC
  Administered 2020-05-13 – 2020-05-16 (×9): 200 mg via ORAL
  Filled 2020-05-13 (×9): qty 2

## 2020-05-13 NOTE — Consult Note (Signed)
NAME:  April [name], MRN:  [id number], DOB:  1956/10/13, LOS: 0 ADMISSION DATE:  05/13/2020, CONSULTATION DATE:  05/13/20 REFERRING MD:  Roosevelt Locks  CHIEF COMPLAINT:  SOB   Brief History   April [name] is a 64 y.o. female who was admitted 6/21 for acute on chronic hypoxic respiratory failure.   History of present illness   April [name] is a 64 y.o. female who has a PMH including but not limited to PCKD s/p renal transplant on chronic prednisone, mycophenalate, prograf, COPD on 2L O2 (followed by Dr. Vaughan Browner), dCHF, pulm HTN (see "past medical history" for rest).  She presented to [facility] ED 6/21 with dyspnea, wheezing, fever/chills x 2 days.  Denied any cough, chest pain, myalgias, exposures to known sick contacts.  She has not received a COVID vaccine.  In ED, she was hypoxic on baseline 2L so was increased to 4L.  Due to increased WOB with RR in high 20s to low 30's, she was placed on BiPAP.  ABG was reassuring (7.34 / 54 /154).  CXR showed bibasilar atelectasis with some vascular congestion.  LE duplex was preliminary negative.  Due to need for BiPAP and concern she may deteriorate further, PCCM was asked to see in consultation.  Past Medical History  has End stage renal disease ([place]); 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, implant, and graft; Chronic cough; Bronchitis, mucopurulent recurrent ([place]); Pneumonia; Asthma, mild intermittent; GERD without esophagitis; Renal transplant recipient; Hyperlipidemia; Depression; Chronic diastolic CHF (congestive heart failure) ([place]); Influenza B; Persistent asthma with status asthmaticus; Nausea vomiting and diarrhea; Lower urinary tract infectious disease; Asthma, chronic, unspecified asthma severity, with acute exacerbation; SIRS (systemic inflammatory response syndrome) ([place]); Bilateral closed proximal tibial fracture; Closed right ankle fracture; Fracture; Acute respiratory failure with hypoxia ([place]); Acute on chronic diastolic CHF  (congestive heart failure) ([place]); Hyponatremia; Medication management; Physical deconditioning; Closed left hip fracture ([place]); Left wrist fracture; Status post total hip replacement, left; Post-op pain; Acute blood loss anemia; Polycystic kidney; Left displaced femoral neck fracture ([place]); Fracture of left superior pubic ramus ([place]); Pain; Anxiety state; Sepsis ([place]); Hypoxia; Shortness of breath; Essential hypertension; Glaucoma; SVT (supraventricular tachycardia) ([place]); Anemia of chronic disease; Palliative care encounter; Goals of care, counseling/discussion; PNA (pneumonia); Acute on chronic respiratory failure with hypoxia (HCC); COPD exacerbation ([place]); Pressure injury of skin; Sepsis due to gram-negative UTI ([place]); Pericardial effusion; Chronic respiratory failure with hypoxia ([place]); Proteinuria; Acute respiratory distress; Asthma; and Decubitus ulcer of coccyx, unstageable ([place]) on their problem list.  [facility] Events   6/21 > admit.  Consults:  None.  Procedures:  None.  Significant Diagnostic Tests:  LE duplex 6/21 > prelim negative.  Micro Data:  COVID 6/21 >  Blood 6/21 >  Urine 6/21 >   Antimicrobials:  Vanc 6/21 >  Cefepime 6/21 >    Interim history/subjective:  Comfortable on BiPAP.  States she is feeling somewhat improved.  Objective:  Blood pressure 129/64, pulse (!) 105, temperature (!) 102.4 F (39.1 C), resp. rate (!) 22, SpO2 100 %.       No intake or output data in the 24 hours ending 05/13/20 1505 There were no vitals filed for this visit.  Examination: General: Adult female, chronically ill appearing, in NAD. Neuro: A&O x 3, no deficits. HEENT: [place]/AT. Sclerae anicteric.  BiPAP in place. Cardiovascular: RRR, no M/R/G.  Lungs: Respirations even and unlabored.  CTA bilaterally, No W/R/R.   Abdomen: BS x 4, soft, NT/ND.  Musculoskeletal: No gross  deformities, no edema.  Skin: Intact, warm, no rashes.  Assessment & Plan:   Acute on chronic  hypoxic respiratory failure - multifactorial with underlying COPD + CHF. - Continue supplemental O2 as needed to maintain SpO2 > 88 - 90%. - Continue steroids, dose adjusted. - D/c fluids. - Lasix 40mg  x 1 and assess response before further dosing. - Continue BD's but hold home symbicort and budesonide while on systemic IV steroids. - Bronchial hygiene. - OK to continue empiric abx given immunosuppression and leukocytosis but doubt pulmonary source / bacterial PNA.  Would recommend further workup for source ID. - F/u as outpatient following discharge.  Rest per primary team.  Best Practice:  Diet: NPO. Pain/Anxiety/Delirium protocol (if indicated): N/A. VAP protocol (if indicated): N/A. DVT prophylaxis: SCD's / Lovenox. GI prophylaxis: N/A. Glucose control: SSI. Mobility: Bedrest. Code Status: Full. Family Communication: Per primary. Disposition: Progressive.  Labs   CBC: Recent Labs  Lab 05/13/20 1044 05/13/20 1129  WBC 18.8*  --   NEUTROABS 16.8*  --   HGB 9.5* 12.6  HCT 32.4* 37.0  MCV 103.8*  --   PLT 236  --    Basic Metabolic Panel Recent Labs  Lab 05/13/20 1044 05/13/20 1129  NA 136 139  K 4.3 4.1  CL 101  --   CO2 25  --   GLUCOSE 129*  --   BUN 22  --   CREATININE 0.90  --   CALCIUM 9.6  --    GFR: Estimated Creatinine Clearance: 59.8 mL/min (by C-G formula based on SCr of 0.9 mg/dL). Recent Labs  Lab 05/13/20 1044 05/13/20 1104  WBC 18.8*  --   LATICACIDVEN  --  0.9   Liver Function Tests: Recent Labs  Lab 05/13/20 1044  AST 22  ALT 18  ALKPHOS 94  BILITOT 1.3*  PROT 7.1  ALBUMIN 2.8*   No results for input(s): LIPASE, AMYLASE in the last 168 hours. No results for input(s): AMMONIA in the last 168 hours. ABG    Component Value Date/Time   PHART 7.341 (L) 05/13/2020 1129   PCO2ART 54.5 (H) 05/13/2020 1129   PO2ART 154 (H) 05/13/2020 1129   HCO3 28.9 (H) 05/13/2020 1129   TCO2 30 05/13/2020 1129   ACIDBASEDEF 3.0 (H) 01/31/2020  2229   O2SAT 99.0 05/13/2020 1129    Coagulation Profile: Recent Labs  Lab 05/13/20 1148  INR 1.2   Cardiac Enzymes: No results for input(s): CKTOTAL, CKMB, CKMBINDEX, TROPONINI in the last 168 hours. HbA1C: Hgb A1c MFr Bld  Date/Time Value Ref Range Status  04/29/2020 03:15 PM 5.9 (H) 4.8 - 5.6 % Final    Comment:    (NOTE) Pre diabetes:          5.7%-6.4% Diabetes:              >6.4% Glycemic control for   <7.0% adults with diabetes   12/24/2018 06:55 AM 5.4 4.8 - 5.6 % Final    Comment:    (NOTE) Pre diabetes:          5.7%-6.4% Diabetes:              >6.4% Glycemic control for   <7.0% adults with diabetes    CBG: No results for input(s): GLUCAP in the last 168 hours.  Review of Systems:   All negative; except for those that are bolded, which indicate positives.  Constitutional: weight loss, weight gain, night sweats, fevers, chills, fatigue, weakness.  HEENT: headaches, sore throat, sneezing, nasal  congestion, post nasal drip, difficulty swallowing, tooth/dental problems, visual complaints, visual changes, ear aches. Neuro: difficulty with speech, weakness, numbness, ataxia. CV:  chest pain, orthopnea, PND, swelling in lower extremities, dizziness, palpitations, syncope.  Resp: cough, hemoptysis, dyspnea, wheezing. GI: heartburn, indigestion, abdominal pain, nausea, vomiting, diarrhea, constipation, change in bowel habits, loss of appetite, hematemesis, melena, hematochezia.  GU: dysuria, change in color of urine, urgency or frequency, flank pain, hematuria. MSK: joint pain or swelling, decreased range of motion. Psych: change in mood or affect, depression, anxiety, suicidal ideations, homicidal ideations. Skin: rash, itching, bruising.   Past medical history  She,  has a past medical history of Arthritis, Asthma, CHF (congestive heart failure) ([place]), COPD (chronic obstructive pulmonary disease) ([place]), Depression, Essential hypertension (02/08/2020), GERD  (gastroesophageal reflux disease), Hyperlipidemia, Hyperparathyroidism, Polycystic kidney, and PONV (postoperative nausea and vomiting).   Surgical History    Past Surgical History:  Procedure Laterality Date  . 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 BILATERAL OOPHORECTOMY    . CARPAL TUNNEL RELEASE  2000  . CESAREAN SECTION    . INSERTION OF DIALYSIS CATHETER  03/31/2012   Procedure: INSERTION OF DIALYSIS CATHETER;  Surgeon: Angelia Mould, MD;  Location: [facility];  Service: Vascular;  Laterality: N/A;  insertion of dialysis catheter right internal jugular vein  . 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 ORIF TIBIA FRACTURE Left 12/23/2018   Procedure: OPEN REDUCTION INTERNAL FIXATION (ORIF) TIBIA FRACTURE;  Surgeon: Shona Needles, MD;  Location: [facility];  Service: Orthopedics;  Laterality: Left;  . ORIF TIBIA PLATEAU Right 12/23/2018   Procedure: OPEN REDUCTION INTERNAL FIXATION (ORIF) TIBIAL PLATEAU;  Surgeon: Shona Needles, MD;  Location: [facility];  Service: Orthopedics;  Laterality: Right;  . ORIF WRIST FRACTURE Left 12/08/2019   Procedure: OPEN REDUCTION INTERNAL FIXATION (ORIF) WRIST FRACTURE, REPAIR LACERATION LEFT FOREARM;  Surgeon: Dorna Leitz, MD;  Location: [facility];  Service: Orthopedics;  Laterality: Left;  . TONSILLECTOMY  1967  . TOTAL HIP ARTHROPLASTY Left 12/08/2019   Procedure: TOTAL HIP ARTHROPLASTY ANTERIOR APPROACH;  Surgeon: Dorna Leitz, MD;  Location: [facility];  Service: Orthopedics;  Laterality: Left;  . TUBAL LIGATION  2010     Social History   reports that she quit smoking about 22 years ago. Her smoking use included cigarettes. She has a 3.75 pack-year smoking history. She has never used smokeless tobacco. She reports that she does not drink alcohol and does not use drugs.   Family history   Her family history includes Heart disease in her father; Hypertension in her mother; Polycystic kidney disease in her brother and father.    Allergies Allergies  Allergen Reactions  . Infed [Iron Dextran] Other (See Comments)    Chest tightness  . Pentamidine Itching, Shortness Of Breath and Swelling  . Erythromycin [Erythromycin] Other (See Comments)    Mouth Ulcers  . Iohexol Other (See Comments)    Per patient "she has had a kidney transplant and should never have contrast"  . Oxycodone Nausea Only and Nausea And Vomiting  . Erythromycin Rash    Causes breakout in mouth  . Ultram [Tramadol Hcl] Anxiety     Home meds  Prior to Admission medications   Medication Sig Start Date End Date Taking? Authorizing Provider  albuterol (VENTOLIN HFA) 108 (90 Base) MCG/ACT inhaler Inhale 2 puffs into the lungs every 4 (four) hours as needed for wheezing or shortness of breath. 01/04/20  Yes Lauraine Rinne  J, PA-C  allopurinol (ZYLOPRIM) 300 MG tablet Take 1 tablet (300 mg total) by mouth daily. 01/04/20  Yes Angiulli, Lavon Paganini, PA-C  Amino Acids-Protein Hydrolys (FEEDING SUPPLEMENT, PRO-STAT SUGAR FREE 64,) LIQD Take 30 mLs by mouth 2 (two) times daily with a meal.   Yes [provider]  ascorbic acid (VITAMIN C) 500 MG tablet Take 500 mg by mouth daily.   Yes [provider]  aspirin 81 MG chewable tablet Chew 81 mg by mouth daily.   Yes [provider]  brimonidine (ALPHAGAN P) 0.1 % SOLN Place 1 drop into both eyes 2 (two) times daily.    Yes [provider]  budesonide (PULMICORT) 0.5 MG/2ML nebulizer solution Take 2 mLs (0.5 mg total) by nebulization 2 (two) times daily. 04/19/20  Yes Amin, Jeanella Flattery, MD  cholecalciferol (VITAMIN D3) 25 MCG (1000 UNIT) tablet Take 1 tablet (1,000 Units total) by mouth daily. 01/04/20  Yes Angiulli, Lavon Paganini, PA-C  collagenase (SANTYL) ointment Apply 1 application topically daily.   Yes [provider]  cycloSPORINE (RESTASIS) 0.05 % ophthalmic emulsion Place 1 drop into both eyes 2 (two) times daily. 01/04/20  Yes Angiulli, Lavon Paganini, PA-C   dextromethorphan-guaiFENesin (MUCINEX DM) 30-600 MG 12hr tablet Take 1 tablet by mouth 2 (two) times daily as needed for cough. 04/19/20  Yes Amin, Jeanella Flattery, MD  docusate sodium (COLACE) 100 MG capsule Take 1 capsule (100 mg total) by mouth 2 (two) times daily. 12/13/19  Yes Shelly Coss, MD  dorzolamide (TRUSOPT) 2 % ophthalmic solution Place 1 drop into both eyes daily. 06/25/19  Yes [provider]  feeding supplement, ENSURE ENLIVE, (ENSURE ENLIVE) LIQD Take 237 mLs by mouth 2 (two) times daily between meals. 02/23/20  Yes Alma Friendly, MD  ferrous sulfate 325 (65 FE) MG tablet Take 325 mg by mouth daily with breakfast.   Yes [provider]  FLUoxetine (PROZAC) 40 MG capsule Take 1 capsule (40 mg total) by mouth daily. 01/04/20  Yes Angiulli, Lavon Paganini, PA-C  furosemide (LASIX) 40 MG tablet Take 1 tablet (40 mg total) by mouth daily. 02/07/20  Yes Sheikh, Omair Latif, DO  gabapentin (NEURONTIN) 100 MG capsule Take 200 mg by mouth 3 (three) times daily.   Yes [provider]  guaiFENesin (MUCINEX) 600 MG 12 hr tablet Take 1 tablet (600 mg total) by mouth 2 (two) times daily. 02/06/20  Yes Sheikh, Omair Latif, DO  insulin aspart (NOVOLOG) 100 UNIT/ML injection Inject 2-12 Units into the skin See admin instructions. Per sliding scale subcutaneous, once a day - If blood sugar is: 0 - 200: 0 units  201 - 250: 2 units  251 - 300: 4 units  301 - 350: 6 units  351 - 400: 8 units  401 - 450: 10 units  451 - 500: 12 units  500 or greater: Call MD and give 14 units, then recheck blood sugar in 2 hours, at that time if blood sugar is greater than 400 or less than 80, call PEC TRIAGE.   Yes [provider]  ipratropium (ATROVENT HFA) 17 MCG/ACT inhaler Inhale 1 puff into the lungs every 6 (six) hours. For COPD   Yes [provider]  levalbuterol (XOPENEX) 0.63 MG/3ML nebulizer solution Take 0.63 mg by nebulization every 6 (six) hours as needed for  wheezing or shortness of breath.   Yes [provider]  magnesium oxide (MAG-OX) 400 MG tablet Take 1 tablet (400 mg total) by mouth 2 (  two) times daily. 01/04/20  Yes Angiulli, Lavon Paganini, PA-C  Multiple Vitamin (MULTIVITAMIN WITH MINERALS) TABS tablet Take 1 tablet by mouth daily. 02/24/20  Yes Alma Friendly, MD  mycophenolate (MYFORTIC) 180 MG EC tablet Take 180 mg by mouth 2 (two) times daily.   Yes [provider]  pantoprazole (PROTONIX) 40 MG tablet Take 1 tablet (40 mg total) by mouth 2 (two) times daily. 01/04/20  Yes Angiulli, Lavon Paganini, PA-C  predniSONE (DELTASONE) 5 MG tablet Take 1.5 tablets (7.5 mg total) by mouth daily with breakfast. 03/18/20  Yes Arrien, Jimmy Picket, MD  PROGRAF 1 MG capsule Take 1 mg by mouth daily. 03/22/19  Yes [provider]  protein supplement (PROSOURCE NO CARB) LIQD Take 30 mLs by mouth in the morning and at bedtime.   Yes [provider]  sennosides-docusate sodium (SENOKOT-S) 8.6-50 MG tablet Take 1 tablet by mouth daily.   Yes [provider]  SYMBICORT 160-4.5 MCG/ACT inhaler Inhale 2 puffs into the lungs 2 (two) times daily. 01/04/20  Yes Angiulli, Lavon Paganini, PA-C  tacrolimus (PROGRAF) 0.5 MG capsule Take 0.5 mg by mouth at bedtime. Takes brand name Prograf   Yes [provider]  TRAVATAN Z 0.004 % SOLN ophthalmic solution Place 1 drop into both eyes at bedtime. 07/03/16  Yes [provider]  tuberculin (APLISOL) 5 UNIT/0.1ML injection Inject 5 Units into the skin once.   Yes [provider]  valACYclovir (VALTREX) 500 MG tablet Take 500 mg by mouth 2 (two) times daily.   Yes [provider]  verapamil (CALAN-SR) 240 MG CR tablet Take 1 tablet (240 mg total) by mouth daily. 03/19/20 05/13/20 Yes Arrien, Jimmy Picket, MD  vitamin B-12 (CYANOCOBALAMIN) 100 MCG tablet Take 1,000 mcg by mouth daily.    Yes [provider]  hydrOXYzine (ATARAX/VISTARIL) 10 MG tablet Take  10 mg by mouth 2 (two) times daily as needed for anxiety.    [provider]  ipratropium-albuterol (DUONEB) 0.5-2.5 (3) MG/3ML SOLN Take 3 mLs by nebulization 2 (two) times daily. 03/18/20 04/29/20  Arrien, Jimmy Picket, MD     Montey Hora, [facility] 05/13/2020, 3:05 PM

## 2020-05-13 NOTE — Progress Notes (Signed)
Notified bedside nurse of need to administer antibiotics.  

## 2020-05-13 NOTE — ED Notes (Signed)
Stage 2 pressure ulcer present on arrival.

## 2020-05-13 NOTE — ED Notes (Signed)
CBG Results of 79 reported to [place], RN.

## 2020-05-13 NOTE — ED Provider Notes (Signed)
[facility] Provider Note   CSN: [id number] Arrival date & time: 05/13/20  1024     History Chief Complaint  Patient presents with  . Shortness of Breath  . Chest Pain    April [name] is a 64 y.o. female.  64 y.o female with a PMH of CHF, COPD, Pericardial effusion, Acute on chronic respiratory failure presents to the ED with a chief complaint of shortness of breath since yesterday.  Patient reports she began to feel winded, states that she has tried using inhaler without any improvement in symptoms. She also endorses a productive cough, along with substernal chest tightness. She was recently hospitalized earlier this month, for a similar complaint.  She reports she did improve while returning to [place], however has now had increased work of breathing she is on 2 L of gym via nasal cannula.  EMS placed patient on 4 L with oxygen saturation at 100%. No sick contacts.   The history is provided by the patient and medical records.       Past Medical History:  Diagnosis Date  . Arthritis   . Asthma   . CHF (congestive heart failure) ([place])   . COPD (chronic obstructive pulmonary disease) ([place])   . Depression   . Essential hypertension 02/08/2020  . GERD (gastroesophageal reflux disease)   . Hyperlipidemia   . Hyperparathyroidism   . Polycystic kidney   . PONV (postoperative nausea and vomiting)     Patient Active Problem List   Diagnosis Date Noted  . Decubitus ulcer of coccyx, unstageable ([place]) 05/01/2020  . Asthma 04/29/2020  . Sepsis due to gram-negative UTI ([place]) 04/16/2020  . Pericardial effusion 04/16/2020  . Chronic respiratory failure with hypoxia ([place]) 04/16/2020  . Proteinuria 04/16/2020  . Acute respiratory distress 04/16/2020  . Pressure injury of skin 04/08/2020  . COPD exacerbation ([place]) 04/07/2020  . Acute on chronic respiratory failure with hypoxia ([place]) 03/14/2020  . PNA (pneumonia) 03/13/2020  . SVT  (supraventricular tachycardia) ([place])   . Anemia of chronic disease   . Palliative care encounter   . Goals of care, counseling/discussion   . Essential hypertension 02/08/2020  . Glaucoma 02/08/2020  . Sepsis ([place]) 01/31/2020  . Hypoxia   . Shortness of breath   . Fracture of left superior pubic ramus ([facility])   . Pain   . Anxiety state   . Left displaced femoral neck fracture ([place]) 12/15/2019  . Status post total hip replacement, left   . Post-op pain   . Acute blood loss anemia   . Polycystic kidney   . Closed left hip fracture ([place]) 12/07/2019  . Left wrist fracture 12/07/2019  . Medication management 09/22/2019  . Physical deconditioning 09/22/2019  . Hyponatremia 06/29/2019  . Acute respiratory failure with hypoxia ([place]) 04/19/2019  . Acute on chronic diastolic CHF (congestive heart failure) ([place]) 04/19/2019  . Bilateral closed proximal tibial fracture 12/21/2018  . Closed right ankle fracture 12/21/2018  . Fracture 12/21/2018  . Lower urinary tract infectious disease 10/03/2018  . Asthma, chronic, unspecified asthma severity, with acute exacerbation 10/03/2018  . SIRS (systemic inflammatory response syndrome) ([place]) 10/03/2018  . Nausea vomiting and diarrhea 10/02/2018  . Chronic diastolic CHF (congestive heart failure) ([place]) 10/01/2017  . Influenza B 10/01/2017  . Persistent asthma with status asthmaticus   . Pneumonia 07/15/2016  . Asthma, mild intermittent 07/15/2016  . GERD without esophagitis 07/15/2016  . Renal transplant recipient 07/15/2016  . Hyperlipidemia 07/15/2016  . Depression  07/15/2016  . Bronchitis, mucopurulent recurrent ([place]) 08/08/2014  . Chronic cough 07/24/2014  . End stage renal disease ([place]) 03/23/2012  . 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 BILATERAL OOPHORECTOMY    . CARPAL  TUNNEL RELEASE  2000  . CESAREAN SECTION    . INSERTION OF DIALYSIS CATHETER  03/31/2012   Procedure: INSERTION OF DIALYSIS CATHETER;  Surgeon: Angelia Mould, MD;  Location: [facility];  Service: Vascular;  Laterality: N/A;  insertion of dialysis catheter right internal jugular vein  . 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 ORIF TIBIA FRACTURE Left 12/23/2018   Procedure: OPEN REDUCTION INTERNAL FIXATION (ORIF) TIBIA FRACTURE;  Surgeon: Shona Needles, MD;  Location: [facility];  Service: Orthopedics;  Laterality: Left;  . ORIF TIBIA PLATEAU Right 12/23/2018   Procedure: OPEN REDUCTION INTERNAL FIXATION (ORIF) TIBIAL PLATEAU;  Surgeon: Shona Needles, MD;  Location: [facility];  Service: Orthopedics;  Laterality: Right;  . ORIF WRIST FRACTURE Left 12/08/2019   Procedure: OPEN REDUCTION INTERNAL FIXATION (ORIF) WRIST FRACTURE, REPAIR LACERATION LEFT FOREARM;  Surgeon: Dorna Leitz, MD;  Location: [facility];  Service: Orthopedics;  Laterality: Left;  . TONSILLECTOMY  1967  . TOTAL HIP ARTHROPLASTY Left 12/08/2019   Procedure: TOTAL HIP ARTHROPLASTY ANTERIOR APPROACH;  Surgeon: Dorna Leitz, MD;  Location: [facility];  Service: Orthopedics;  Laterality: Left;  . TUBAL LIGATION  2010     OB History   No obstetric history on file.     Family History  Problem Relation Age of Onset  . Hypertension Mother   . Heart disease Father        CABG history  . Polycystic kidney disease Father   . Polycystic kidney disease Brother     Social History   Tobacco Use  . Smoking status: Former Smoker    Packs/day: 0.25    Years: 15.00    Pack years: 3.75    Types: Cigarettes    Quit date: 03/22/1998    Years since quitting: 22.1  . Smokeless tobacco: Never Used  Vaping Use  . Vaping Use: Never used  Substance Use Topics  . Alcohol use: No    Comment: social  . Drug use: No    Home Medications Prior to Admission medications   Medication Sig Start Date End Date Taking? Authorizing Provider  albuterol  (VENTOLIN HFA) 108 (90 Base) MCG/ACT inhaler Inhale 2 puffs into the lungs every 4 (four) hours as needed for wheezing or shortness of breath. 01/04/20  Yes Angiulli, Lavon Paganini, PA-C  allopurinol (ZYLOPRIM) 300 MG tablet Take 1 tablet (300 mg total) by mouth daily. 01/04/20  Yes Angiulli, Lavon Paganini, PA-C  Amino Acids-Protein Hydrolys (FEEDING SUPPLEMENT, PRO-STAT SUGAR FREE 64,) LIQD Take 30 mLs by mouth 2 (two) times daily with a meal.   Yes [provider]  ascorbic acid (VITAMIN C) 500 MG tablet Take 500 mg by mouth daily.   Yes [provider]  aspirin 81 MG chewable tablet Chew 81 mg by mouth daily.   Yes [provider]  brimonidine (ALPHAGAN P) 0.1 % SOLN Place 1 drop into both eyes 2 (two) times daily.    Yes [provider]  budesonide (PULMICORT) 0.5 MG/2ML nebulizer solution Take 2 mLs (0.5 mg total) by nebulization 2 (two) times daily. 04/19/20  Yes Amin,  Ankit Chirag, MD  cholecalciferol (VITAMIN D3) 25 MCG (1000 UNIT) tablet Take 1 tablet (1,000 Units total) by mouth daily. 01/04/20  Yes Angiulli, Lavon Paganini, PA-C  collagenase (SANTYL) ointment Apply 1 application topically daily.   Yes [provider]  cycloSPORINE (RESTASIS) 0.05 % ophthalmic emulsion Place 1 drop into both eyes 2 (two) times daily. 01/04/20  Yes Angiulli, Lavon Paganini, PA-C  dextromethorphan-guaiFENesin (MUCINEX DM) 30-600 MG 12hr tablet Take 1 tablet by mouth 2 (two) times daily as needed for cough. 04/19/20  Yes Amin, Jeanella Flattery, MD  docusate sodium (COLACE) 100 MG capsule Take 1 capsule (100 mg total) by mouth 2 (two) times daily. 12/13/19  Yes Shelly Coss, MD  dorzolamide (TRUSOPT) 2 % ophthalmic solution Place 1 drop into both eyes daily. 06/25/19  Yes [provider]  feeding supplement, ENSURE ENLIVE, (ENSURE ENLIVE) LIQD Take 237 mLs by mouth 2 (two) times daily between meals. 02/23/20  Yes Alma Friendly, MD  ferrous sulfate 325 (65 FE) MG tablet Take 325 mg by  mouth daily with breakfast.   Yes [provider]  FLUoxetine (PROZAC) 40 MG capsule Take 1 capsule (40 mg total) by mouth daily. 01/04/20  Yes Angiulli, Lavon Paganini, PA-C  furosemide (LASIX) 40 MG tablet Take 1 tablet (40 mg total) by mouth daily. 02/07/20  Yes Sheikh, Omair Latif, DO  gabapentin (NEURONTIN) 100 MG capsule Take 200 mg by mouth 3 (three) times daily.   Yes [provider]  guaiFENesin (MUCINEX) 600 MG 12 hr tablet Take 1 tablet (600 mg total) by mouth 2 (two) times daily. 02/06/20  Yes Sheikh, Omair Latif, DO  insulin aspart (NOVOLOG) 100 UNIT/ML injection Inject 2-12 Units into the skin See admin instructions. Per sliding scale subcutaneous, once a day - If blood sugar is: 0 - 200: 0 units  201 - 250: 2 units  251 - 300: 4 units  301 - 350: 6 units  351 - 400: 8 units  401 - 450: 10 units  451 - 500: 12 units  500 or greater: Call MD and give 14 units, then recheck blood sugar in 2 hours, at that time if blood sugar is greater than 400 or less than 80, call PEC TRIAGE.   Yes [provider]  ipratropium (ATROVENT HFA) 17 MCG/ACT inhaler Inhale 1 puff into the lungs every 6 (six) hours. For COPD   Yes [provider]  levalbuterol (XOPENEX) 0.63 MG/3ML nebulizer solution Take 0.63 mg by nebulization every 6 (six) hours as needed for wheezing or shortness of breath.   Yes [provider]  magnesium oxide (MAG-OX) 400 MG tablet Take 1 tablet (400 mg total) by mouth 2 (two) times daily. 01/04/20  Yes Angiulli, Lavon Paganini, PA-C  Multiple Vitamin (MULTIVITAMIN WITH MINERALS) TABS tablet Take 1 tablet by mouth daily. 02/24/20  Yes Alma Friendly, MD  mycophenolate (MYFORTIC) 180 MG EC tablet Take 180 mg by mouth 2 (two) times daily.   Yes [provider]  pantoprazole (PROTONIX) 40 MG tablet Take 1 tablet (40 mg total) by mouth 2 (two) times daily. 01/04/20  Yes Angiulli, Lavon Paganini, PA-C  predniSONE (DELTASONE) 5 MG tablet Take 1.5 tablets  (7.5 mg total) by mouth daily with breakfast. 03/18/20  Yes Arrien, Jimmy Picket, MD  PROGRAF 1 MG capsule Take 1 mg by mouth daily. 03/22/19  Yes [provider]  protein supplement (PROSOURCE NO CARB) LIQD Take 30 mLs by mouth in the morning and at bedtime.  Yes [provider]  sennosides-docusate sodium (SENOKOT-S) 8.6-50 MG tablet Take 1 tablet by mouth daily.   Yes [provider]  SYMBICORT 160-4.5 MCG/ACT inhaler Inhale 2 puffs into the lungs 2 (two) times daily. 01/04/20  Yes Angiulli, Lavon Paganini, PA-C  tacrolimus (PROGRAF) 0.5 MG capsule Take 0.5 mg by mouth at bedtime. Takes brand name Prograf   Yes [provider]  TRAVATAN Z 0.004 % SOLN ophthalmic solution Place 1 drop into both eyes at bedtime. 07/03/16  Yes [provider]  tuberculin (APLISOL) 5 UNIT/0.1ML injection Inject 5 Units into the skin once.   Yes [provider]  valACYclovir (VALTREX) 500 MG tablet Take 500 mg by mouth 2 (two) times daily.   Yes [provider]  verapamil (CALAN-SR) 240 MG CR tablet Take 1 tablet (240 mg total) by mouth daily. 03/19/20 05/13/20 Yes Arrien, Jimmy Picket, MD  vitamin B-12 (CYANOCOBALAMIN) 100 MCG tablet Take 1,000 mcg by mouth daily.    Yes [provider]  hydrOXYzine (ATARAX/VISTARIL) 10 MG tablet Take 10 mg by mouth 2 (two) times daily as needed for anxiety.    [provider]  ipratropium-albuterol (DUONEB) 0.5-2.5 (3) MG/3ML SOLN Take 3 mLs by nebulization 2 (two) times daily. 03/18/20 04/29/20  Arrien, Jimmy Picket, MD    Allergies    Ardyth Harps Antonieta Pert dextran], Pentamidine, Erythromycin [erythromycin], Iohexol, Oxycodone, Erythromycin, and Ultram [tramadol hcl]  Review of Systems   Review of Systems  Constitutional: Positive for chills and fever.  HENT: Negative for sore throat.   Respiratory: Positive for cough, chest tightness and shortness of breath.   Cardiovascular: Positive for chest pain and  palpitations. Negative for leg swelling.  Gastrointestinal: Negative for abdominal pain, diarrhea and vomiting.  Genitourinary: Negative for flank pain.  Musculoskeletal: Negative for back pain.  Skin: Negative for pallor and wound.  Neurological: Negative for light-headedness and headaches.  All other systems reviewed and are negative.   Physical Exam Updated Vital Signs BP (!) 145/87 (BP Location: Right Arm)   Pulse (!) 118   Temp (!) 102.4 F (39.1 C)   Resp (!) 27   SpO2 100%   Physical Exam Vitals and nursing note reviewed.  Constitutional:      Appearance: She is ill-appearing and toxic-appearing.     Interventions: She is not intubated. HENT:     Head: Normocephalic and atraumatic.  Eyes:     Pupils: Pupils are equal, round, and reactive to light.  Cardiovascular:     Rate and Rhythm: Tachycardia present.  Pulmonary:     Effort: Tachypnea and accessory muscle usage present. She is not intubated.     Breath sounds: Examination of the right-upper field reveals decreased breath sounds. Examination of the left-upper field reveals decreased breath sounds. Examination of the right-middle field reveals decreased breath sounds. Examination of the right-lower field reveals decreased breath sounds. Decreased breath sounds and rales present.  Chest:     Chest wall: No tenderness.  Abdominal:     Palpations: Abdomen is soft. There is no mass.     Tenderness: There is no abdominal tenderness.  Musculoskeletal:     Right lower leg: No tenderness. No edema.     Left lower leg: No tenderness. No edema.  Skin:    General: Skin is warm and dry.  Neurological:     Mental Status: She is alert and oriented to person, place, and time.     ED Results / Procedures / Treatments   Labs (all  labs ordered are listed, but only abnormal results are displayed) Labs Reviewed  CBC WITH DIFFERENTIAL/PLATELET - Abnormal; Notable for the following components:      Result Value   WBC 18.8 (*)     RBC 3.12 (*)    Hemoglobin 9.5 (*)    HCT 32.4 (*)    MCV 103.8 (*)    MCHC 29.3 (*)    RDW 23.3 (*)    Neutro Abs 16.8 (*)    Abs Immature Granulocytes 0.15 (*)    All other components within normal limits  COMPREHENSIVE METABOLIC PANEL - Abnormal; Notable for the following components:   Glucose, Bld 129 (*)    Albumin 2.8 (*)    Total Bilirubin 1.3 (*)    All other components within normal limits  I-STAT ARTERIAL BLOOD GAS, ED - Abnormal; Notable for the following components:   pH, Arterial 7.341 (*)    pCO2 arterial 54.5 (*)    pO2, Arterial 154 (*)    Bicarbonate 28.9 (*)    Acid-Base Excess 3.0 (*)    All other components within normal limits  CULTURE, BLOOD (ROUTINE X 2)  CULTURE, BLOOD (ROUTINE X 2)  URINE CULTURE  SARS CORONAVIRUS 2 BY RT PCR (HOSPITAL ORDER, [facility])  LACTIC ACID, PLASMA  APTT  PROTIME-INR  URINALYSIS, ROUTINE W REFLEX MICROSCOPIC  BLOOD GAS, ARTERIAL  LACTIC ACID, PLASMA  TROPONIN I (HIGH SENSITIVITY)  TROPONIN I (HIGH SENSITIVITY)    EKG EKG Interpretation  Date/Time:  Monday May 13 2020 10:44:46 EDT Ventricular Rate:  118 PR Interval:    QRS Duration: 86 QT Interval:  309 QTC Calculation: 433 R Axis:   80 Text Interpretation: Sinus tachycardia No significant change since last tracing Confirmed by Calvert Cantor [phone]) on 05/13/2020 10:50:46 AM   Radiology DG Chest Portable 1 View  Result Date: 05/13/2020 CLINICAL DATA:  Short of breath chest pain EXAM: PORTABLE CHEST 1 VIEW COMPARISON:  04/29/2020 FINDINGS: Hypoventilation with decreased lung volume. Mild bibasilar atelectasis similar to the prior study Negative for heart failure or pneumonia. Small right pleural effusion. IMPRESSION: Hypoventilation with bibasilar atelectasis unchanged from the prior study Electronically Signed   By: Franchot Gallo M.D.   On: 05/13/2020 10:54    Procedures .Critical Care Performed by: Janeece Fitting,  PA-C Authorized by: Janeece Fitting, PA-C   Critical care provider statement:    Critical care time (minutes):  35   Critical care start time:  05/13/2020 12:00 PM   Critical care end time:  05/13/2020 12:45 PM   Critical care time was exclusive of:  Separately billable procedures and treating other patients   Critical care was necessary to treat or prevent imminent or life-threatening deterioration of the following conditions:  Sepsis   Critical care was time spent personally by me on the following activities:  Blood draw for specimens, development of treatment plan with patient or surrogate, discussions with consultants, evaluation of patient's response to treatment, examination of patient, obtaining history from patient or surrogate, ordering and performing treatments and interventions, ordering and review of laboratory studies, ordering and review of radiographic studies, pulse oximetry, re-evaluation of patient's condition and review of old charts   (including critical care time)  Medications Ordered in ED Medications  vancomycin (VANCOREADY) IVPB 1500 mg/300 mL (1,500 mg Intravenous New Bag/Given 05/13/20 1310)  ceFEPIme (MAXIPIME) 2 g in sodium chloride 0.9 % 100 mL IVPB (has no administration in time range)  vancomycin (VANCOREADY) IVPB 750 mg/150 mL (  has no administration in time range)  methylPREDNISolone sodium succinate (SOLU-MEDROL) 40 mg/mL injection 40 mg (has no administration in time range)  ipratropium (ATROVENT) nebulizer solution 0.5 mg (0.5 mg Nebulization Not Given 05/13/20 1412)  ipratropium (ATROVENT) nebulizer solution 0.5 mg (has no administration in time range)  ceFEPIme (MAXIPIME) 2 g in sodium chloride 0.9 % 100 mL IVPB (0 g Intravenous Stopped 05/13/20 1310)  metroNIDAZOLE (FLAGYL) IVPB 500 mg (0 mg Intravenous Stopped 05/13/20 1311)  acetaminophen (TYLENOL) tablet 1,000 mg (1,000 mg Oral Given 05/13/20 1128)  ipratropium (ATROVENT HFA) inhaler 4 puff (4 puffs Inhalation  Given 05/13/20 1201)    ED Course  I have reviewed the triage vital signs and the nursing notes.  Pertinent labs & imaging results that were available during my care of the patient were reviewed by me and considered in my medical decision making (see chart for details).  Clinical Course as of May 13 1417  Mon May 13, 2020  1339 WBC(!): 18.8 [JS]    Clinical Course User Index [JS] Janeece Fitting, [place]   MDM Rules/Calculators/A&P   Patient with a complicated history including COPD, CHF presents to the ED with shortness of breath which began yesterday.  Patient was recently hospitalized 06/11 at the beginning of the month for a similar complaint, had a septic work-up done, cultures such as UA, chest x-ray,blood cultures and MRS noted to be benign.  Patient reports improvement after her release from the hospital, episodes began yesterday.  Denied any fever on arrival however was found to be febrile with a temperature of 102.4, pressures remained stable.  Tachycardia at 118, tachypnea with respiratory rate in the upper 20s.  She is using this during muscle for breathing, lungs are diminished to auscultation throughout.  He is currently on 2 L of O2 via nasal cannula, this has been increased to 4 L by EMS due to difficulty maintaining her stats.  She is satting at 100% on 4 L nasal cannula.ABG obtained   Region of her labs remarkable for a leukocytosis of 18.8, hemoglobin improved than her baseline.  First troponin is negative.  Lactic acid was also negative.  PT and INR within normal limits.  CMP without any electrolyte derangement, LFTs are within normal limits.  Creatinine function is unremarkable.  She has a previous history of kidney replacement, reports she is on immunosuppression therapy daily.  Code sepsis was activated for patient early on, she was given broad-spectrum antibiotics as the source is unknown.  Xray of the chest showed: Hypoventilation with bibasilar atelectasis unchanged from  the prior  study      1:56 PM spoke to Dr. Roosevelt Locks hospitalist who will admit patient for further care.  He is familiar with her during past admission.  Patient admitted in stable condition.    Portions of this note were generated with [employer]. Dictation errors may occur despite best attempts at proofreading.  Final Clinical Impression(s) / ED Diagnoses Final diagnoses:  Sepsis, due to unspecified organism, unspecified whether acute organ dysfunction present [facility])    Rx / DC Orders ED Discharge Orders    None       Janeece Fitting, PA-C 05/13/20 1418    Truddie Hidden, MD 05/13/20 1450

## 2020-05-13 NOTE — Progress Notes (Signed)
Patient placed back on BIPAP for increased work of breathing and patient using accessory muscles. Patient looks more comfortable on BIPAP at this time. RT will continue to monitor.

## 2020-05-13 NOTE — Progress Notes (Signed)
Venous duplex       has been completed. Preliminary results can be found under CV proc through chart review. Magdalene Tardiff, BS, RDMS, RVT   

## 2020-05-13 NOTE — Progress Notes (Signed)
RT NOTES: Removed patient from bipap at this time and placed on 4lpm nasal cannula. Patient comfortable and tolerating well.

## 2020-05-13 NOTE — ED Triage Notes (Signed)
Per [facility] pt from [facility], pt called out for SOB starting last night, reports CP this am. Hx of COPD, diminished through out, wheezing bilateral upper lobes. 94% 4L [place] usually uses 2L [place]   HR 117 RR 40 98.1 temp BP 168/88 98-100% 4L [place]

## 2020-05-13 NOTE — H&P (Signed)
History and Physical    April [name] BJS:[id number] DOB: [birth year] DOA: 05/13/2020  PCP: [name], MD (Confirm with patient/family/NH records and if not entered, this has to be entered at [facility] point of entry) Patient coming from: [facility]  I have personally briefly reviewed patient's old medical records in [place]  Chief Complaint: SOB  HPI: April [name] is a 64 y.o. female nursing home resident at [facility] with medical history significant of polycystic kidney disease s/p renal transplant (on prednisone, mycophenalate, prograf), COPD on 2L [place] baseline, diastolic CHF, chronic hypoxic resp failure, obesity, HTN, pulmonary HTN, recurrent UTI, presented to ED withSOB wheezingand fever. She started to feel sick about 2 days ago, with significant increasing feeling of short of breath denied any cough.  Also had episodes of chills and subjective fever. No dysuria no diarrhea no rash. She was not vaccinated for COVID-19.  Denies any change of her medications.  She was in the hospital to treat for COPD exacerbation about 2 weeks ago and completed a short course of high-dose p.o. prednisone and discharged with her usual dose of steroid since.  She also complained about chest discomfort and palpitations this morning. ED Course: Chest x-ray no acute infiltrates. WBC 18.8. ABG showed normal pH 7.34, PCO2 54 and pO2 154. Review of Systems: As per HPI otherwise 10 point review of systems negative.    Past Medical History:  Diagnosis Date  . Arthritis   . Asthma   . CHF (congestive heart failure) ([place])   . COPD (chronic obstructive pulmonary disease) ([place])   . Depression   . Essential hypertension 02/08/2020  . GERD (gastroesophageal reflux disease)   . Hyperlipidemia   . Hyperparathyroidism   . Polycystic kidney   . PONV (postoperative nausea and vomiting)     Past Surgical History:  Procedure Laterality Date  . AV FISTULA PLACEMENT  10-21-2010   left  Brachiocephalic AVF  . BILATERAL OOPHORECTOMY    . CARPAL TUNNEL RELEASE  2000  . CESAREAN SECTION    . INSERTION OF DIALYSIS CATHETER  03/31/2012   Procedure: INSERTION OF DIALYSIS CATHETER;  Surgeon: Angelia Mould, MD;  Location: [place];  Service: Vascular;  Laterality: N/A;  insertion of dialysis catheter right internal jugular vein  . 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 ORIF TIBIA FRACTURE Left 12/23/2018   Procedure: OPEN REDUCTION INTERNAL FIXATION (ORIF) TIBIA FRACTURE;  Surgeon: Shona Needles, MD;  Location: [facility];  Service: Orthopedics;  Laterality: Left;  . ORIF TIBIA PLATEAU Right 12/23/2018   Procedure: OPEN REDUCTION INTERNAL FIXATION (ORIF) TIBIAL PLATEAU;  Surgeon: Shona Needles, MD;  Location: [place];  Service: Orthopedics;  Laterality: Right;  . ORIF WRIST FRACTURE Left 12/08/2019   Procedure: OPEN REDUCTION INTERNAL FIXATION (ORIF) WRIST FRACTURE, REPAIR LACERATION LEFT FOREARM;  Surgeon: Dorna Leitz, MD;  Location: [facility];  Service: Orthopedics;  Laterality: Left;  . TONSILLECTOMY  1967  . TOTAL HIP ARTHROPLASTY Left 12/08/2019   Procedure: TOTAL HIP ARTHROPLASTY ANTERIOR APPROACH;  Surgeon: Dorna Leitz, MD;  Location: [facility];  Service: Orthopedics;  Laterality: Left;  . TUBAL LIGATION  2010     reports that she quit smoking about 22 years ago. Her smoking use included cigarettes. She has a 3.75 pack-year smoking history. She has never used smokeless tobacco. She reports that she does not drink alcohol and does not use drugs.  Allergies  Allergen Reactions  . April [name] [Iron Dextran] Other (See Comments)  Chest tightness  . Pentamidine Itching, Shortness Of Breath and Swelling  . Erythromycin [Erythromycin] Other (See Comments)    Mouth Ulcers  . Iohexol Other (See Comments)    Per patient "she has had a kidney transplant and should never have contrast"  . Oxycodone Nausea Only and Nausea And Vomiting  . Erythromycin Rash    Causes breakout in mouth  . Ultram  [Tramadol Hcl] Anxiety    Family History  Problem Relation Age of Onset  . Hypertension Mother   . Heart disease Father        CABG history  . Polycystic kidney disease Father   . Polycystic kidney disease Brother     Prior to Admission medications   Medication Sig Start Date End Date Taking? Authorizing Provider  albuterol (VENTOLIN HFA) 108 (90 Base) MCG/ACT inhaler Inhale 2 puffs into the lungs every 4 (four) hours as needed for wheezing or shortness of breath. 01/04/20  Yes Angiulli, Lavon Paganini, PA-C  allopurinol (ZYLOPRIM) 300 MG tablet Take 1 tablet (300 mg total) by mouth daily. 01/04/20  Yes Angiulli, Lavon Paganini, PA-C  Amino Acids-Protein Hydrolys (FEEDING SUPPLEMENT, PRO-STAT SUGAR FREE 64,) LIQD Take 30 mLs by mouth 2 (two) times daily with a meal.   Yes [provider]  ascorbic acid (VITAMIN C) 500 MG tablet Take 500 mg by mouth daily.   Yes [provider]  aspirin 81 MG chewable tablet Chew 81 mg by mouth daily.   Yes [provider]  brimonidine (ALPHAGAN P) 0.1 % SOLN Place 1 drop into both eyes 2 (two) times daily.    Yes [provider]  budesonide (PULMICORT) 0.5 MG/2ML nebulizer solution Take 2 mLs (0.5 mg total) by nebulization 2 (two) times daily. 04/19/20  Yes Amin, Jeanella Flattery, MD  cholecalciferol (VITAMIN D3) 25 MCG (1000 UNIT) tablet Take 1 tablet (1,000 Units total) by mouth daily. 01/04/20  Yes Angiulli, Lavon Paganini, PA-C  collagenase (SANTYL) ointment Apply 1 application topically daily.   Yes [provider]  cycloSPORINE (RESTASIS) 0.05 % ophthalmic emulsion Place 1 drop into both eyes 2 (two) times daily. 01/04/20  Yes Angiulli, Lavon Paganini, PA-C  dextromethorphan-guaiFENesin (MUCINEX DM) 30-600 MG 12hr tablet Take 1 tablet by mouth 2 (two) times daily as needed for cough. 04/19/20  Yes Amin, Jeanella Flattery, MD  docusate sodium (COLACE) 100 MG capsule Take 1 capsule (100 mg total) by mouth 2 (two) times daily. 12/13/19  Yes Shelly Coss, MD  dorzolamide (TRUSOPT) 2 % ophthalmic solution Place 1 drop into both eyes daily. 06/25/19  Yes [provider]  feeding supplement, ENSURE ENLIVE, (ENSURE ENLIVE) LIQD Take 237 mLs by mouth 2 (two) times daily between meals. 02/23/20  Yes Alma Friendly, MD  ferrous sulfate 325 (65 FE) MG tablet Take 325 mg by mouth daily with breakfast.   Yes [provider]  FLUoxetine (PROZAC) 40 MG capsule Take 1 capsule (40 mg total) by mouth daily. 01/04/20  Yes Angiulli, Lavon Paganini, PA-C  furosemide (LASIX) 40 MG tablet Take 1 tablet (40 mg total) by mouth daily. 02/07/20  Yes Sheikh, Omair Latif, DO  gabapentin (NEURONTIN) 100 MG capsule Take 200 mg by mouth 3 (three) times daily.   Yes [provider]  guaiFENesin (MUCINEX) 600 MG 12 hr tablet Take 1 tablet (600 mg total) by mouth 2 (two) times daily. 02/06/20  Yes Sheikh, Omair Latif, DO  insulin aspart (NOVOLOG) 100 UNIT/ML injection Inject 2-12 Units into the skin See admin  instructions. Per sliding scale subcutaneous, once a day - If blood sugar is: 0 - 200: 0 units  201 - 250: 2 units  251 - 300: 4 units  301 - 350: 6 units  351 - 400: 8 units  401 - 450: 10 units  451 - 500: 12 units  500 or greater: Call MD and give 14 units, then recheck blood sugar in 2 hours, at that time if blood sugar is greater than 400 or less than 80, call PEC TRIAGE.   Yes [provider]  ipratropium (ATROVENT HFA) 17 MCG/ACT inhaler Inhale 1 puff into the lungs every 6 (six) hours. For COPD   Yes [provider]  levalbuterol (XOPENEX) 0.63 MG/3ML nebulizer solution Take 0.63 mg by nebulization every 6 (six) hours as needed for wheezing or shortness of breath.   Yes [provider]  magnesium oxide (MAG-OX) 400 MG tablet Take 1 tablet (400 mg total) by mouth 2 (two) times daily. 01/04/20  Yes Angiulli, Lavon Paganini, PA-C  Multiple Vitamin (MULTIVITAMIN WITH MINERALS) TABS tablet Take 1 tablet by mouth daily.  02/24/20  Yes Alma Friendly, MD  mycophenolate (MYFORTIC) 180 MG EC tablet Take 180 mg by mouth 2 (two) times daily.   Yes [provider]  pantoprazole (PROTONIX) 40 MG tablet Take 1 tablet (40 mg total) by mouth 2 (two) times daily. 01/04/20  Yes Angiulli, Lavon Paganini, PA-C  predniSONE (DELTASONE) 5 MG tablet Take 1.5 tablets (7.5 mg total) by mouth daily with breakfast. 03/18/20  Yes Arrien, Jimmy Picket, MD  PROGRAF 1 MG capsule Take 1 mg by mouth daily. 03/22/19  Yes [provider]  protein supplement (PROSOURCE NO CARB) LIQD Take 30 mLs by mouth in the morning and at bedtime.   Yes [provider]  sennosides-docusate sodium (SENOKOT-S) 8.6-50 MG tablet Take 1 tablet by mouth daily.   Yes [provider]  SYMBICORT 160-4.5 MCG/ACT inhaler Inhale 2 puffs into the lungs 2 (two) times daily. 01/04/20  Yes Angiulli, Lavon Paganini, PA-C  tacrolimus (PROGRAF) 0.5 MG capsule Take 0.5 mg by mouth at bedtime. Takes brand name Prograf   Yes [provider]  TRAVATAN Z 0.004 % SOLN ophthalmic solution Place 1 drop into both eyes at bedtime. 07/03/16  Yes [provider]  tuberculin (APLISOL) 5 UNIT/0.1ML injection Inject 5 Units into the skin once.   Yes [provider]  valACYclovir (VALTREX) 500 MG tablet Take 500 mg by mouth 2 (two) times daily.   Yes [provider]  verapamil (CALAN-SR) 240 MG CR tablet Take 1 tablet (240 mg total) by mouth daily. 03/19/20 05/13/20 Yes Arrien, Jimmy Picket, MD  vitamin B-12 (CYANOCOBALAMIN) 100 MCG tablet Take 1,000 mcg by mouth daily.    Yes [provider]  hydrOXYzine (ATARAX/VISTARIL) 10 MG tablet Take 10 mg by mouth 2 (two) times daily as needed for anxiety.    [provider]  ipratropium-albuterol (DUONEB) 0.5-2.5 (3) MG/3ML SOLN Take 3 mLs by nebulization 2 (two) times daily. 03/18/20 04/29/20  Tawni Millers, MD    Physical Exam: Vitals:   05/13/20 1058 05/13/20  1111 05/13/20 1400 05/13/20 1423  BP:  (!) 145/87  126/70  Pulse:  (!) 118  (!) 106  Resp:  (!) 27 (!) 27 (!) 30  Temp: (!) 102.4 F (39.1 C)     SpO2:  100%  100%    Constitutional: NAD, calm, comfortable Vitals:   05/13/20 1058 05/13/20 1111 05/13/20 1400 05/13/20  1423  BP:  (!) 145/87  126/70  Pulse:  (!) 118  (!) 106  Resp:  (!) 27 (!) 27 (!) 30  Temp: (!) 102.4 F (39.1 C)     SpO2:  100%  100%   Eyes: PERRL, lids and conjunctivae normal ENMT: Mucous membranes are dry. Posterior pharynx clear of any exudate or lesions.Normal dentition.  Neck: normal, supple, no masses, no thyromegaly Respiratory: Diminished breathing sound bilaterally, scattered wheezing.  Significantly increased respiratory effort.  Positive signs of using accessory muscle.  Cardiovascular: Regular rate and rhythm, no murmurs / rubs / gallops. 2+ extremity edema. 2+ pedal pulses. No carotid bruits.  Abdomen: no tenderness, no masses palpated. No hepatosplenomegaly. Bowel sounds positive.  Musculoskeletal: no clubbing. No joint deformity upper and lower extremities. Good ROM, no contractures. Normal muscle tone.  Skin: no rashes, lesions, ulcers. No induration. Cyanosis on fingers and toes Neurologic: Lethargic, arousable, following simple command, moving all limbs.  Psychiatric: Lethargic.     Labs on Admission: I have personally reviewed following labs and imaging studies  CBC: Recent Labs  Lab 05/13/20 1044 05/13/20 1129  WBC 18.8*  --   NEUTROABS 16.8*  --   HGB 9.5* 12.6  HCT 32.4* 37.0  MCV 103.8*  --   PLT 236  --    Basic Metabolic Panel: Recent Labs  Lab 05/13/20 1044 05/13/20 1129  NA 136 139  K 4.3 4.1  CL 101  --   CO2 25  --   GLUCOSE 129*  --   BUN 22  --   CREATININE 0.90  --   CALCIUM 9.6  --    GFR: Estimated Creatinine Clearance: 59.8 mL/min (by C-G formula based on SCr of 0.9 mg/dL). Liver Function Tests: Recent Labs  Lab 05/13/20 1044  AST 22  ALT 18   ALKPHOS 94  BILITOT 1.3*  PROT 7.1  ALBUMIN 2.8*   No results for input(s): LIPASE, AMYLASE in the last 168 hours. No results for input(s): AMMONIA in the last 168 hours. Coagulation Profile: Recent Labs  Lab 05/13/20 1148  INR 1.2   Cardiac Enzymes: No results for input(s): CKTOTAL, CKMB, CKMBINDEX, TROPONINI in the last 168 hours. BNP (last 3 results) No results for input(s): PROBNP in the last 8760 hours. HbA1C: No results for input(s): HGBA1C in the last 72 hours. CBG: No results for input(s): GLUCAP in the last 168 hours. Lipid Profile: No results for input(s): CHOL, HDL, LDLCALC, TRIG, CHOLHDL, LDLDIRECT in the last 72 hours. Thyroid Function Tests: No results for input(s): TSH, T4TOTAL, FREET4, T3FREE, THYROIDAB in the last 72 hours. Anemia Panel: No results for input(s): VITAMINB12, FOLATE, FERRITIN, TIBC, IRON, RETICCTPCT in the last 72 hours. Urine analysis:    Component Value Date/Time   COLORURINE YELLOW 04/29/2020 1350   APPEARANCEUR CLEAR 04/29/2020 1350   LABSPEC 1.014 04/29/2020 1350   PHURINE 5.0 04/29/2020 1350   GLUCOSEU NEGATIVE 04/29/2020 1350   HGBUR NEGATIVE 04/29/2020 1350   BILIRUBINUR NEGATIVE 04/29/2020 1350   KETONESUR NEGATIVE 04/29/2020 1350   PROTEINUR 30 (A) 04/29/2020 1350   UROBILINOGEN 0.2 05/15/2013 0733   NITRITE NEGATIVE 04/29/2020 1350   LEUKOCYTESUR NEGATIVE 04/29/2020 1350    Radiological Exams on Admission: DG Chest Portable 1 View  Result Date: 05/13/2020 CLINICAL DATA:  Short of breath chest pain EXAM: PORTABLE CHEST 1 VIEW COMPARISON:  04/29/2020 FINDINGS: Hypoventilation with decreased lung volume. Mild bibasilar atelectasis similar to the prior study Negative for heart failure or pneumonia. Small right pleural  effusion. IMPRESSION: Hypoventilation with bibasilar atelectasis unchanged from the prior study Electronically Signed   By: Franchot Gallo M.D.   On: 05/13/2020 10:54    EKG: Independently reviewed. Sinus  tachy  Assessment/Plan Active Problems:   PNA (pneumonia)  (please populate well all problems here in Problem List. (For example, if patient is on BP meds at home and you resume or decide to hold them, it is a problem that needs to be her. Same for CAD, COPD, HLD and so on)   Sepsis -2/2 PNA likely, need to rule out COVID, given pt immune compromised and never had COVID vaccine. -Agreed broad coverage of ABX for now -Clinically she appears to be dry, will start slow rate IV fluid for 12 hours and then reevaluate, hold Lasix meantime  Acute on chronic hypoxic respiratory failure -ABG 7.34/54/154 -X-ray showed no acute infiltrates, COVID pending, will send respiratory virus paneal -Agreed to broad coverage for now -BIPAP for breathing effort relief -D/W pulm attending, if mentation and or hypoxia deteriorated further may need ICU and intubation.   Pulmonary hypertension -She appears to be dry although she does have bilateral edema as well, will hold diuresis for today and reevaluate tomorrow.  Peripheral edema -Check DVT study -Likely from chronic pulmonary hypertension  Hx of SVT -Continue verapamil, clinically looks like dry, will give some IV fluid and reevaluate  Cyanosis -Peripheral likely from sepsis and ABG pending for hypoxia  History of kidney transplant -Continue 3 immune modulation medications and steroid.  DVT prophylaxis: Lovenox Code Status: Full code Family Communication: Left husband message Disposition Plan: Patient sick with complicated medical problems of new onset sepsis with background immune compromise, likely will need more than 3 days hospital stay to treat sepsis. Consults called: Pulm Admission status: PCU   Lequita Halt MD [facility] Pager [phone]   05/13/2020, 2:28 PM

## 2020-05-13 NOTE — ED Notes (Signed)
CBG Results of 74 reported to Wallace, RN.

## 2020-05-13 NOTE — Progress Notes (Signed)
Pharmacy Antibiotic Note  April [name] is a 64 y.o. female admitted on 05/13/2020 with sepsis.  Pharmacy has been consulted for vancomycin and cefepime dosing. Pt is febrile with Tmax 102.4 and WBC is elevated at 18.8. SCr is WNL at 0.9. Lactic acid is normal. Pt with multiple recent admissions.   Plan: Vancomycin 1500mg  IV x 1 then 750mg  IV Q12H Cefepime 2gm IV Q12H F/u renal fxn, C&S, clinical status and trough at SS    Temp (24hrs), Avg:102.4 F (39.1 C), Min:102.4 F (39.1 C), Max:102.4 F (39.1 C)  Recent Labs  Lab 05/13/20 1044 05/13/20 1104  WBC 18.8*  --   CREATININE 0.90  --   LATICACIDVEN  --  0.9    Estimated Creatinine Clearance: 59.8 mL/min (by C-G formula based on SCr of 0.9 mg/dL).    Allergies  Allergen Reactions   Infed [Iron Dextran] Other (See Comments)    Chest tightness   Pentamidine Itching, Shortness Of Breath and Swelling   Erythromycin [Erythromycin] Other (See Comments)    Mouth Ulcers   Iohexol Other (See Comments)    Per patient "she has had a kidney transplant and should never have contrast"   Oxycodone Nausea Only and Nausea And Vomiting   Erythromycin Rash    Causes breakout in mouth   Ultram [Tramadol Hcl] Anxiety    Antimicrobials this admission: Vanc 6/21>> Cefepime 6/21>> Flagyl x 1 6/21  Dose adjustments this admission: N/A  Microbiology results: Pending  Thank you for allowing pharmacy to be a part of this patients care.  Suleman Gunning, Rande Lawman 05/13/2020 11:09 AM

## 2020-05-14 ENCOUNTER — Inpatient Hospital Stay (HOSPITAL_COMMUNITY): Payer: Medicare Other

## 2020-05-14 ENCOUNTER — Encounter (HOSPITAL_COMMUNITY): Payer: Self-pay | Admitting: Internal Medicine

## 2020-05-14 DIAGNOSIS — J449 Chronic obstructive pulmonary disease, unspecified: Secondary | ICD-10-CM

## 2020-05-14 LAB — CBC
HCT: 28.8 % — ABNORMAL LOW (ref 36.0–46.0)
Hemoglobin: 8.5 g/dL — ABNORMAL LOW (ref 12.0–15.0)
MCH: 30.4 pg (ref 26.0–34.0)
MCHC: 29.5 g/dL — ABNORMAL LOW (ref 30.0–36.0)
MCV: 102.9 fL — ABNORMAL HIGH (ref 80.0–100.0)
Platelets: 205 10*3/uL (ref 150–400)
RBC: 2.8 MIL/uL — ABNORMAL LOW (ref 3.87–5.11)
RDW: 23.3 % — ABNORMAL HIGH (ref 11.5–15.5)
WBC: 18.7 10*3/uL — ABNORMAL HIGH (ref 4.0–10.5)
nRBC: 0 % (ref 0.0–0.2)

## 2020-05-14 LAB — BASIC METABOLIC PANEL
Anion gap: 10 (ref 5–15)
BUN: 30 mg/dL — ABNORMAL HIGH (ref 8–23)
CO2: 24 mmol/L (ref 22–32)
Calcium: 9.3 mg/dL (ref 8.9–10.3)
Chloride: 102 mmol/L (ref 98–111)
Creatinine, Ser: 1.06 mg/dL — ABNORMAL HIGH (ref 0.44–1.00)
GFR calc Af Amer: >60 mL/min (ref >60)
GFR calc non Af Amer: 56 mL/min — ABNORMAL LOW (ref >60)
Glucose, Bld: 94 mg/dL (ref 70–99)
Potassium: 3.8 mmol/L (ref 3.5–5.1)
Sodium: 136 mmol/L (ref 135–145)

## 2020-05-14 LAB — GLUCOSE, CAPILLARY
Glucose-Capillary: 121 mg/dL — ABNORMAL HIGH (ref 70–99)
Glucose-Capillary: 216 mg/dL — ABNORMAL HIGH (ref 70–99)
Glucose-Capillary: 369 mg/dL — ABNORMAL HIGH (ref 70–99)
Glucose-Capillary: 299 mg/dL — ABNORMAL HIGH (ref 70–99)

## 2020-05-14 LAB — FOLATE: Folate: 25 ng/mL (ref >5.9)

## 2020-05-14 LAB — MRSA PCR SCREENING: MRSA by PCR: NEGATIVE

## 2020-05-14 LAB — IRON AND TIBC
Iron: 9 ug/dL — ABNORMAL LOW (ref 28–170)
Saturation Ratios: 5 % — ABNORMAL LOW (ref 10.4–31.8)
TIBC: 169 ug/dL — ABNORMAL LOW (ref 250–450)
UIBC: 160 ug/dL

## 2020-05-14 LAB — LACTIC ACID, PLASMA: Lactic Acid, Venous: 1.1 mmol/L (ref 0.5–1.9)

## 2020-05-14 LAB — BRAIN NATRIURETIC PEPTIDE: B Natriuretic Peptide: 468.5 pg/mL — ABNORMAL HIGH (ref 0.0–100.0)

## 2020-05-14 LAB — URINE CULTURE: Culture: NO GROWTH

## 2020-05-14 LAB — RETICULOCYTES
Immature Retic Fract: 14 % (ref 2.3–15.9)
RBC.: 2.61 MIL/uL — ABNORMAL LOW (ref 3.87–5.11)
Retic Count, Absolute: 49.1 10*3/uL (ref 19.0–186.0)
Retic Ct Pct: 1.9 % (ref 0.4–3.1)

## 2020-05-14 LAB — MAGNESIUM: Magnesium: 1.5 mg/dL — ABNORMAL LOW (ref 1.7–2.4)

## 2020-05-14 LAB — VITAMIN B12: Vitamin B-12: 899 pg/mL (ref 180–914)

## 2020-05-14 LAB — PROCALCITONIN: Procalcitonin: 6.01 ng/mL

## 2020-05-14 LAB — C-REACTIVE PROTEIN: CRP: 27 mg/dL — ABNORMAL HIGH (ref <1.0)

## 2020-05-14 LAB — FERRITIN: Ferritin: 275 ng/mL (ref 11–307)

## 2020-05-14 LAB — D-DIMER, QUANTITATIVE: D-Dimer, Quant: 2.75 ug/mL-FEU — ABNORMAL HIGH (ref 0.00–0.50)

## 2020-05-14 MED ORDER — DIPHENHYDRAMINE HCL 50 MG/ML IJ SOLN
25.0000 mg | Freq: Once | INTRAMUSCULAR | Status: AC
  Administered 2020-05-14: 25 mg via INTRAVENOUS
  Filled 2020-05-14: qty 1

## 2020-05-14 MED ORDER — DIPHENHYDRAMINE HCL 25 MG PO CAPS: 50.0000 mg | ORAL_CAPSULE | Freq: Once | ORAL | Status: DC

## 2020-05-14 MED ORDER — IOHEXOL 300 MG/ML  SOLN
100.0000 mL | Freq: Once | INTRAMUSCULAR | Status: AC | PRN
  Administered 2020-05-14: 100 mL via INTRAVENOUS

## 2020-05-14 MED ORDER — IPRATROPIUM-ALBUTEROL 0.5-2.5 (3) MG/3ML IN SOLN
3.0000 mL | Freq: Four times a day (QID) | RESPIRATORY_TRACT | Status: DC
  Administered 2020-05-15 – 2020-05-16 (×7): 3 mL via RESPIRATORY_TRACT
  Filled 2020-05-14 (×7): qty 3

## 2020-05-14 MED ORDER — METHYLPREDNISOLONE SODIUM SUCC 125 MG IJ SOLR
60.0000 mg | Freq: Two times a day (BID) | INTRAMUSCULAR | Status: DC
  Administered 2020-05-14 – 2020-05-15 (×3): 60 mg via INTRAVENOUS
  Filled 2020-05-14 (×3): qty 2

## 2020-05-14 MED ORDER — POTASSIUM CHLORIDE 2 MEQ/ML IV SOLN
INTRAVENOUS | Status: DC
  Filled 2020-05-14 (×3): qty 1000

## 2020-05-14 MED ORDER — HYDROCORTISONE NA SUCCINATE PF 250 MG IJ SOLR: 200.0000 mg | Freq: Once | INTRAMUSCULAR | Status: DC

## 2020-05-14 MED ORDER — CHLORHEXIDINE GLUCONATE CLOTH 2 % EX PADS
6.0000 | MEDICATED_PAD | Freq: Every day | CUTANEOUS | Status: DC
  Administered 2020-05-14 – 2020-05-22 (×7): 6 via TOPICAL

## 2020-05-14 MED ORDER — FERROUS SULFATE 325 (65 FE) MG PO TABS
325.0000 mg | ORAL_TABLET | Freq: Two times a day (BID) | ORAL | Status: DC
  Administered 2020-05-14 – 2020-05-22 (×16): 325 mg via ORAL
  Filled 2020-05-14 (×16): qty 1

## 2020-05-14 MED ORDER — LACTATED RINGERS IV BOLUS
1000.0000 mL | Freq: Once | INTRAVENOUS | Status: AC
  Administered 2020-05-14: 1000 mL via INTRAVENOUS

## 2020-05-14 MED ORDER — MAGNESIUM SULFATE 2 GM/50ML IV SOLN
2.0000 g | Freq: Once | INTRAVENOUS | Status: AC
  Administered 2020-05-14: 2 g via INTRAVENOUS
  Filled 2020-05-14: qty 50

## 2020-05-14 MED ORDER — DIPHENHYDRAMINE HCL 50 MG/ML IJ SOLN: 50.0000 mg | Freq: Once | INTRAMUSCULAR | Status: DC

## 2020-05-14 MED ORDER — VERAPAMIL HCL ER 120 MG PO TBCR
120.0000 mg | EXTENDED_RELEASE_TABLET | Freq: Every day | ORAL | Status: DC
  Administered 2020-05-14 – 2020-05-16 (×3): 120 mg via ORAL
  Filled 2020-05-14 (×3): qty 1

## 2020-05-14 MED ORDER — ENOXAPARIN SODIUM 80 MG/0.8ML ~~LOC~~ SOLN
1.0000 mg/kg | Freq: Two times a day (BID) | SUBCUTANEOUS | Status: DC
  Administered 2020-05-14 – 2020-05-16 (×4): 75 mg via SUBCUTANEOUS
  Filled 2020-05-14 (×4): qty 0.8

## 2020-05-14 MED ORDER — TECHNETIUM TO 99M ALBUMIN AGGREGATED
1.5000 | Freq: Once | INTRAVENOUS | Status: AC | PRN
  Administered 2020-05-14: 1.5 via INTRAVENOUS

## 2020-05-14 NOTE — Progress Notes (Addendum)
Patient back from CT. Foley 14 French inserted per MD order. Urine returned - 700 cc. Patient tolerated well. Will continue to monitor.

## 2020-05-14 NOTE — Progress Notes (Signed)
Pt not in need of bipap at this time. RT will continue to monitor pt throughout the night and will place pt back on if needed.

## 2020-05-14 NOTE — Progress Notes (Addendum)
PROGRESS NOTE                                                                                                                                                                                                             Patient Demographics:    April [name], is a 64 y.o. female, DOB - 1956-09-21, FKC:[id number]  Admit date - 05/13/2020   Admitting Physician Lequita Halt, MD  Outpatient Primary MD for the patient is [name], MD  LOS - 1  Chief Complaint  Patient presents with  . Shortness of Breath  . Chest Pain       Brief Narrative - April [name] is a 64 y.o. female nursing home resident at [facility] with medical history significant of polycystic kidney disease s/p renal transplant (on prednisone, mycophenalate, prograf), COPD on 2L [place] baseline, diastolic CHF, chronic hypoxic resp failure, obesity, HTN, pulmonary HTN, recurrent UTI, presented to ED withSOB wheezingand fever with no clear source, she has recently been admitted 3-4 times in the last few weeks for similar problems.  She states that about 6 months ago she had a mechanical fall with left-sided hip fracture and since then she has been able to go home and has been in a nursing home.  Her main complaint was gradually progressive shortness of breath, fatigue and low-grade fevers.   Subjective:    April [name] today has, No headache, No chest pain, No abdominal pain - No Nausea, No new weakness tingling or numbness, No Cough - mild SOB.   Assessment  & Plan :     1.  Sepsis with leukocytosis, elevated procalcitonin and CRP.  In a patient with kidney transplant, on chronic immunosuppressive therapy with no clear-cut source.  At this time she has had multiple similar admissions in the recent past and no convincing source has been found, blood cultures are pending, she is currently on empiric IV vancomycin and cefepime which will be continued.  Recent echo from few weeks ago reviewed without any hint  of vegetation, she has had noncontrast CT scans but has no contrast study done recently, I will proceed with getting CT chest noncontrast along with CT abdomen pelvis with contrast to look out for a source, also ordered CMV titers.  Continue aggressive hydration, blood pressure has stabilized, sepsis pathophysiology is improving, will monitor closely.  Recent Labs  Lab 05/13/20 1044 05/13/20 1104 05/13/20 1603 05/14/20 0243 05/14/20 0827  WBC 18.8*  --   --  18.7*  --  PLT 236  --   --  205  --   CRP  --   --   --   --  27.0*  DDIMER  --   --   --   --  2.75*  PROCALCITON  --   --  3.84 6.01  --   LATICACIDVEN  --  0.9  --   --  1.1    2.  History of renal transplant in 2013 due to underlying polycystic kidney disease.  Case discussed with nephrologist Dr. Vanetta Mulders on 05/14/2020, okay for IV contrast with hydration, IV fluids continued, discontinue Myfortic as suggested by nephrology, continue other home medications with higher dose IV steroids.  3.  History of VT.  Dose of verapamil has been reduced due to soft blood pressure.  4.  History of pulmonary hypertension.  Supportive care.  5.  Chronic grade 2 diastolic dysfunction with EF 55%.  Currently compensated clinically, BNP elevated likely due to pulmonary hypertension.  Monitor.  She is clinically dehydrated.  6.  Chronic iron deficiency anemia.  Increased iron supplementation, monitor H&H, type screen.  No source of obvious ongoing bleeding.  Outpatient age-appropriate iron deficiency anemia work-up to be guided by PCP.  7.  Trace leg edema due to malnutrition.  Protein supplements.  8.  Borderline elevated D-dimer.  Due to inflammation from #1 above.  Hydrate, leg ultrasound unremarkable.  9.  Small unstageable sacrococcygeal decubitus ulcer which is chronic.  Wound care following.  Appears noninfected.  See wound care note for details.  Pressure Injury 04/29/20 Coccyx Mid Stage 3 -  Full thickness tissue loss.  Subcutaneous fat may be visible but bone, tendon or muscle are NOT exposed. (Active)  04/29/20 2130  Location: Coccyx  Location Orientation: Mid  Staging: Stage 3 -  Full thickness tissue loss. Subcutaneous fat may be visible but bone, tendon or muscle are NOT exposed.  Wound Description (Comments):   Present on Admission: Yes      Condition - Extremely Guarded  Family Communication  : Left for husband Edison Nasuti [phone] on 05/14/2020 at 13:14 PM  Code Status :  Full  Consults  :  PCCM, Renal Dr. Moshe Cipro over the phone  Procedures  :    CT chest abdomen pelvis ordered.  Bilateral leg venous ultrasound.  No DVT.    TTE. 04/17/20 -  1. Left ventricular ejection fraction, by estimation, is 55 to 60%. The left ventricle has normal function. The left ventricle has no regional wall motion abnormalities. Left ventricular diastolic parameters are consistent with Grade II diastolic dysfunction (pseudonormalization). Elevated left atrial pressure.  2. Right ventricular systolic function is normal. The right ventricular size is normal. There is mildly elevated pulmonary artery systolic pressure. The estimated right ventricular systolic pressure is 75.9 mmHg.  3. Left atrial size was mildly dilated.  4. The mitral valve is degenerative. Trivial mitral valve regurgitation. No evidence of mitral stenosis.  5. The aortic valve is tricuspid. Aortic valve regurgitation is not visualized. No aortic stenosis is present.  6. The inferior vena cava is dilated in size with <50% respiratory variability, suggesting right atrial pressure of 15 mmHg. Comparison(s): No significant change from prior study.   PUD Prophylaxis : PPI  Disposition Plan  :    Status is: Inpatient  Remains inpatient appropriate because:IV treatments appropriate due to intensity of illness or inability to take PO   Dispo: The patient is from: SNF  Anticipated d/c is to: SNF              Anticipated d/c date is: >  3 days              Patient currently is not medically stable to d/c.   DVT Prophylaxis  :  Lovenox    Lab Results  Component Value Date   PLT 205 05/14/2020    Diet :  Diet Order            Diet Heart Room service appropriate? No; Fluid consistency: Thin  Diet effective now                  Inpatient Medications Scheduled Meds: . allopurinol  300 mg Oral Daily  . ascorbic acid  500 mg Oral Daily  . aspirin  81 mg Oral Daily  . brimonidine  1 drop Both Eyes BID  . cholecalciferol  1,000 Units Oral Daily  . collagenase  1 application Topical Daily  . cycloSPORINE  1 drop Both Eyes BID  . docusate sodium  100 mg Oral BID  . dorzolamide  1 drop Both Eyes Daily  . enoxaparin (LOVENOX) injection  40 mg Subcutaneous Q24H  . feeding supplement (ENSURE ENLIVE)  237 mL Oral BID BM  . feeding supplement (PRO-STAT SUGAR FREE 64)  30 mL Oral BID WC  . ferrous sulfate  325 mg Oral BID WC  . FLUoxetine  40 mg Oral Daily  . gabapentin  200 mg Oral TID  . guaiFENesin  600 mg Oral BID  . insulin aspart  0-9 Units Subcutaneous TID WC  . ipratropium-albuterol  3 mL Nebulization Q4H  . latanoprost  1 drop Both Eyes QHS  . magnesium oxide  400 mg Oral BID  . methylPREDNISolone (SOLU-MEDROL) injection  60 mg Intravenous Q12H  . multivitamin with minerals  1 tablet Oral Daily  . pantoprazole  40 mg Oral BID  . senna-docusate  1 tablet Oral Daily  . tacrolimus  0.5 mg Oral QHS  . tacrolimus  1 mg Oral Daily  . valACYclovir  500 mg Oral BID  . verapamil  120 mg Oral Daily  . vitamin B-12  1,000 mcg Oral Daily   Continuous Infusions: . ceFEPime (MAXIPIME) IV 2 g (05/14/20 1158)  . lactated ringers with kcl 75 mL/hr at 05/14/20 1007  . vancomycin 750 mg (05/14/20 0548)   PRN Meds:.acetaminophen **OR** [DISCONTINUED] acetaminophen, hydrOXYzine, levalbuterol, [DISCONTINUED] ondansetron **OR** ondansetron (ZOFRAN) IV  Antibiotics  :   Anti-infectives (From admission, onward)   Start      Dose/Rate Route Frequency Ordered Stop   05/14/20 0200  vancomycin (VANCOREADY) IVPB 750 mg/150 mL     Discontinue     750 mg 150 mL/hr over 60 Minutes Intravenous Every 12 hours 05/13/20 1239     05/14/20 0000  ceFEPIme (MAXIPIME) 2 g in sodium chloride 0.9 % 100 mL IVPB     Discontinue     2 g 200 mL/hr over 30 Minutes Intravenous Every 12 hours 05/13/20 1239     05/13/20 1500  valACYclovir (VALTREX) tablet 500 mg     Discontinue     500 mg Oral 2 times daily 05/13/20 1420     05/13/20 1115  ceFEPIme (MAXIPIME) 2 g in sodium chloride 0.9 % 100 mL IVPB        2 g 200 mL/hr over 30 Minutes Intravenous  Once 05/13/20 1105 05/13/20 1310   05/13/20 1115  metroNIDAZOLE (FLAGYL) IVPB  500 mg        500 mg 100 mL/hr over 60 Minutes Intravenous  Once 05/13/20 1105 05/13/20 1311   05/13/20 1115  vancomycin (VANCOCIN) IVPB 1000 mg/200 mL premix  Status:  Discontinued        1,000 mg 200 mL/hr over 60 Minutes Intravenous  Once 05/13/20 1105 05/13/20 1107   05/13/20 1115  vancomycin (VANCOREADY) IVPB 1500 mg/300 mL        1,500 mg 150 mL/hr over 120 Minutes Intravenous  Once 05/13/20 1107 05/13/20 1612          Objective:   Vitals:   05/14/20 1000 05/14/20 1044 05/14/20 1200 05/14/20 1208  BP: 101/64  105/62   Pulse: 84  89   Resp: (!) 21 (!) 21 19   Temp: 98.6 F (37 C)  97.8 F (36.6 C)   TempSrc: Oral  Oral   SpO2: 100%  100% 100%    SpO2: 100 % O2 Flow Rate (L/min): 3 L/min FiO2 (%): 40 %  Wt Readings from Last 3 Encounters:  05/02/20 74.4 kg  04/19/20 73.2 kg  04/08/20 74.2 kg     Intake/Output Summary (Last 24 hours) at 05/14/2020 1312 Last data filed at 05/14/2020 1007 Gross per 24 hour  Intake 1100 ml  Output --  Net 1100 ml     Physical Exam  Awake Alert, No new F.N deficits, Normal affect [place].AT,PERRAL Supple Neck,No JVD, No cervical lymphadenopathy appriciated.  Symmetrical Chest wall movement, Good air movement bilaterally, CTAB RRR,No Gallops,Rubs or  new Murmurs, No Parasternal Heave +ve B.Sounds, Abd Soft, No tenderness, No organomegaly appriciated, No rebound - guarding or rigidity. No Cyanosis, Clubbing or edema, No new Rash or bruise       Pressure Injury 04/29/20 Coccyx Mid Stage 3 -  Full thickness tissue loss. Subcutaneous fat may be visible but bone, tendon or muscle are NOT exposed. (Active)  04/29/20 2130  Location: Coccyx  Location Orientation: Mid  Staging: Stage 3 -  Full thickness tissue loss. Subcutaneous fat may be visible but bone, tendon or muscle are NOT exposed.  Wound Description (Comments):   Present on Admission: Yes     Data Review:    Recent Labs  Lab 05/13/20 1044 05/13/20 1129 05/14/20 0243  WBC 18.8*  --  18.7*  HGB 9.5* 12.6 8.5*  HCT 32.4* 37.0 28.8*  PLT 236  --  205  MCV 103.8*  --  102.9*  MCH 30.4  --  30.4  MCHC 29.3*  --  29.5*  RDW 23.3*  --  23.3*  LYMPHSABS 1.2  --   --   MONOABS 0.6  --   --   EOSABS 0.1  --   --   BASOSABS 0.0  --   --     Recent Labs  Lab 05/13/20 1044 05/13/20 1129 05/13/20 1148 05/13/20 1603 05/14/20 0243 05/14/20 0827  NA 136 139  --   --  136  --   K 4.3 4.1  --   --  3.8  --   CL 101  --   --   --  102  --   CO2 25  --   --   --  24  --   GLUCOSE 129*  --   --   --  94  --   BUN 22  --   --   --  30*  --   CREATININE 0.90  --   --   --  1.06*  --   CALCIUM 9.6  --   --   --  9.3  --   AST 22  --   --   --   --   --   ALT 18  --   --   --   --   --   ALKPHOS 94  --   --   --   --   --   BILITOT 1.3*  --   --   --   --   --   ALBUMIN 2.8*  --   --   --   --   --   MG  --   --   --   --   --  1.5*  CRP  --   --   --   --   --  27.0*  DDIMER  --   --   --   --   --  2.75*  PROCALCITON  --   --   --  3.84 6.01  --   INR  --   --  1.2  --   --   --   BNP  --   --   --  1,176.0*  --  468.5*    Recent Labs  Lab 05/13/20 1353 05/13/20 1603 05/14/20 0243 05/14/20 0827  CRP  --   --   --  27.0*  DDIMER  --   --   --  2.75*  BNP  --   1,176.0*  --  468.5*  PROCALCITON  --  3.84 6.01  --   SARSCOV2NAA NEGATIVE  --   --   --     ------------------------------------------------------------------------------------------------------------------ No results for input(s): CHOL, HDL, LDLCALC, TRIG, CHOLHDL, LDLDIRECT in the last 72 hours.  Lab Results  Component Value Date   HGBA1C 5.9 (H) 04/29/2020   ------------------------------------------------------------------------------------------------------------------ No results for input(s): TSH, T4TOTAL, T3FREE, THYROIDAB in the last 72 hours.  Invalid input(s): FREET3 ------------------------------------------------------------------------------------------------------------------ Recent Labs    05/14/20 0827  VITAMINB12 899  FOLATE 25.0  FERRITIN 275  TIBC 169*  IRON 9*  RETICCTPCT 1.9    Coagulation profile Recent Labs  Lab 05/13/20 1148  INR 1.2    Recent Labs    05/14/20 0827  DDIMER 2.75*    Cardiac Enzymes No results for input(s): CKMB, TROPONINI, MYOGLOBIN in the last 168 hours.  Invalid input(s): CK ------------------------------------------------------------------------------------------------------------------    Component Value Date/Time   BNP 468.5 (H) 05/14/2020 0827    Micro Results Recent Results (from the past 240 hour(s))  Blood Culture (routine x 2)     Status: None (Preliminary result)   Collection Time: 05/13/20 10:58 AM   Specimen: BLOOD LEFT HAND  Result Value Ref Range Status   Specimen Description BLOOD LEFT HAND  Final   Special Requests   Final    BOTTLES DRAWN AEROBIC AND ANAEROBIC Blood Culture adequate volume   Culture   Final    NO GROWTH < 24 HOURS Performed at [facility], [address], [place], [place] [address]    Report Status PENDING  Incomplete  Blood Culture (routine x 2)     Status: None (Preliminary result)   Collection Time: 05/13/20 11:48 AM   Specimen: BLOOD  Result Value Ref Range Status    Specimen Description BLOOD RIGHT ANTECUBITAL  Final   Special Requests   Final    BOTTLES DRAWN AEROBIC AND ANAEROBIC Blood Culture adequate volume   Culture   Final  NO GROWTH < 24 HOURS Performed at [facility] [address] [place], [place] [address]    Report Status PENDING  Incomplete  SARS Coronavirus 2 by RT PCR (hospital order, performed in [facility] hospital lab) Nasopharyngeal Nasopharyngeal Swab     Status: None   Collection Time: 05/13/20  1:53 PM   Specimen: Nasopharyngeal Swab  Result Value Ref Range Status   SARS Coronavirus 2 NEGATIVE NEGATIVE Final    Comment: (NOTE) SARS-CoV-2 target nucleic acids are NOT DETECTED.  The SARS-CoV-2 RNA is generally detectable in upper and lower respiratory specimens during the acute phase of infection. The lowest concentration of SARS-CoV-2 viral copies this assay can detect is 250 copies / mL. A negative result does not preclude SARS-CoV-2 infection and should not be used as the sole basis for treatment or other patient management decisions.  A negative result may occur with improper specimen collection / handling, submission of specimen other than nasopharyngeal swab, presence of viral mutation(s) within the areas targeted by this assay, and inadequate number of viral copies (<250 copies / mL). A negative result must be combined with clinical observations, patient history, and epidemiological information.  Fact Sheet for Patients:   StrictlyIdeas.no  Fact Sheet for Healthcare Providers: BankingDealers.co.za  This test is not yet approved or  cleared by the [place] FDA and has been authorized for detection and/or diagnosis of SARS-CoV-2 by FDA under an Emergency Use Authorization (EUA).  This EUA will remain in effect (meaning this test can be used) for the duration of the COVID-19 declaration under Section 564(b)(1) of the Act, 21 U.S.C. section  360bbb-3(b)(1), unless the authorization is terminated or revoked sooner.  Performed at [facility], [place] [address] [place], [place] [address]   MRSA PCR Screening     Status: None   Collection Time: 05/14/20  4:46 AM   Specimen: Nasal Mucosa; Nasopharyngeal  Result Value Ref Range Status   MRSA by PCR NEGATIVE NEGATIVE Final    Comment:        The GeneXpert MRSA Assay (FDA approved for NASAL specimens only), is one component of a comprehensive MRSA colonization surveillance program. It is not intended to diagnose MRSA infection nor to guide or monitor treatment for MRSA infections. Performed at [facility], [place] [address], [place], [place] [address]     Radiology Reports CT ABDOMEN PELVIS WO CONTRAST  Result Date: 04/15/2020 CLINICAL DATA:  Worsening shortness of breath. Increased nausea and vomiting. Fever. Patient reports with recent fall with left-sided rib pain. EXAM: CT CHEST, ABDOMEN AND PELVIS WITHOUT CONTRAST TECHNIQUE: Multidetector CT imaging of the chest, abdomen and pelvis was performed following the standard protocol without IV contrast. COMPARISON:  Radiograph earlier this day. Most recent chest CT 07/27/2014, most recent abdominal CT 03/31/2019 FINDINGS: CT CHEST FINDINGS Cardiovascular: No periaortic stranding to suggest injury. Dilated main pulmonary artery at 4.1 cm. Small circumferential pericardial effusion. Multi chamber cardiomegaly. There are prominent venous collaterals anterior to the left axilla and in the included left upper extremity. Decreased density of the blood pool suggesting anemia. Mediastinum/Nodes: No mediastinal hematoma. No pneumomediastinum. Few scattered mediastinal lymph nodes that are not enlarged by size criteria. Limited assessment for hilar adenopathy in the absence of IV contrast. No esophageal wall thickening. Subcentimeter nodule in the left lobe of the thyroid gland, does not need further evaluation based on size.  Lungs/Pleura: Small bilateral pleural effusions. Compressive atelectasis most prominent in the lower lobes. Additional subsegmental atelectasis  in the lingula and left upper lobe. No pneumothorax. No evidence of pulmonary contusion. No pulmonary mass. There is mild central bronchial thickening. Musculoskeletal: Remote left anterior first and second rib fractures. Remote anterior eighth, ninth, and tenth rib fractures with callus. Remote posterior tenth rib fracture. No evidence of acute rib fracture. Remote fractures of right anterior/lateral second through ninth ribs. No acute right rib fracture. No sternal fracture. No fracture of the thoracic spine, included clavicles or shoulder girdles. CT ABDOMEN PELVIS FINDINGS Hepatobiliary: Multiple hepatic cysts. No evidence of a [place] injury or perihepatic hematoma. Layering hyperdensity in the gallbladder is likely sludge. No pericholecystic inflammation. No biliary dilatation. Pancreas: Pancreas is unremarkable. No peripancreatic inflammation. No evidence of pancreatic injury or pancreatic cyst. Spleen: Splenomegaly with spleen spanning 15.4 cm cranial caudal. No perisplenic hematoma or focal abnormality. Adrenals/Urinary Tract: No adrenal nodule. Polycystic kidneys which are enlarged with innumerable cysts of varying sizes. 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 There is no transplant hydronephrosis. No evidence of renal injury. Urinary bladder is only minimally distended, partially obscured by streak artifact from left hip arthroplasty. No obvious focal bladder abnormality. Stomach/Bowel: Sigmoid colonic diverticulosis without diverticulitis. Mild colonic tortuosity. Small volume of colonic stool. Appendix is normal. Stomach and small bowel are unremarkable. No obstruction, inflammatory change, or evidence of injury. Vascular/Lymphatic: Aorto bi-iliac atherosclerosis. No aortic aneurysm. No retroperitoneal fluid. No adenopathy. Reproductive: Uterus partially  obscured by streak artifact from left hip arthroplasty. No suspicious adnexal mass. Other: Small amount of fluid in the left inguinal canal is unchanged from 12/20/2019 CT. No ascites or free fluid in the abdomen. No free air. There is a small fat containing umbilical hernia. Musculoskeletal: Left hip arthroplasty. The bones are under mineralized. Remote left superior and inferior pubic rami fractures. Remote bilateral sacral insufficiency fractures. No evidence of acute fracture. IMPRESSION: 1. No evidence of acute traumatic injury in the chest, abdomen, or pelvis. 2. Small bilateral pleural effusions with compressive atelectasis. 3. Multi chamber cardiomegaly. Small circumferential pericardial effusion. Dilated main pulmonary artery suggesting pulmonary arterial hypertension. 4. No acute findings in the abdomen/pelvis. 5. Polycystic kidneys and liver. Transplant kidney in the right iliac fossa without transplant hydronephrosis. 6. Splenomegaly. 7. Colonic diverticulosis without diverticulitis. 8. Remote bilateral rib fractures.  No acute rib fracture. Aortic Atherosclerosis (ICD10-I70.0). Electronically Signed   By: Keith Rake M.D.   On: 04/15/2020 21:39   DG Chest 1 View  Result Date: 04/16/2020 CLINICAL DATA:  Shortness of breath EXAM: CHEST  1 VIEW COMPARISON:  CT chest 04/15/2020 FINDINGS: Bilateral effusions are less well visualized on today's examination. There is interstitial opacity throughout the lungs with a perihilar and basilar predominance with some fissural and septal thickening as well. The heart is enlarged. Pulmonary vascularity is cephalized indistinct with prominence of the central pulmonary arteries is again noted, better seen on comparison. No acute osseous or soft tissue abnormality. Telemetry leads overlie the chest. IMPRESSION: Findings highly suspicious for congestive heart failure with cardiomegaly, vascular congestion and bilateral effusions with interstitial opacities  suggesting some developing interstitial edema. Electronically Signed   By: [name] M.D.   On: 04/16/2020 01:43   CT Head Wo Contrast  Result Date: 04/15/2020 CLINICAL DATA:  Status post trauma. EXAM: CT HEAD WITHOUT CONTRAST TECHNIQUE: Contiguous axial images were obtained from the base of the skull through the vertex without intravenous contrast. COMPARISON:  Apr 19, 2019 FINDINGS: Brain: No evidence of acute infarction, hemorrhage, hydrocephalus, extra-axial collection or mass lesion/mass effect. Vascular: No  hyperdense vessel or unexpected calcification. Skull: Normal. Negative for fracture or focal lesion. Sinuses/Orbits: No acute finding. Other: None. IMPRESSION: No acute intracranial pathology. Electronically Signed   By: Virgina Norfolk M.D.   On: 04/15/2020 21:44   CT Chest Wo Contrast  Result Date: 04/29/2020 CLINICAL DATA:  Pneumonia. Pleural effusion or abscess suspected. Follow-up to current chest radiograph. EXAM: CT CHEST WITHOUT CONTRAST TECHNIQUE: Multidetector CT imaging of the chest was performed following the standard protocol without IV contrast. COMPARISON:  Current chest radiograph.  CT chest dated 04/15/2020. FINDINGS: Cardiovascular: Heart is mildly enlarged. No pericardial effusion. Minimal left coronary artery calcifications. Aorta is normal in caliber. Minor atherosclerotic calcifications along the descending portion. Main pulmonary artery is dilated to 3.8 cm. Mediastinum/Nodes: No neck base, mediastinal or hilar masses or enlarged lymph nodes. Trachea and esophagus are unremarkable. Lungs/Pleura: Small right and trace left pleural effusions. Mild dependent atelectasis noted in both lower lobes and minimally at the diaphragmatic bases of the left upper lobe lingula right middle lobe and dependent upper lobes. Minor linear atelectasis in the left upper lobe. No convincing pneumonia and no evidence of pulmonary edema. No mass or suspicious nodule. No pneumothorax. Upper  Abdomen: Multiple liver and bilateral renal cysts consistent with polycystic kidney disease. Enlarged spleen, 14.5 cm greatest transverse dimension. No acute findings in the visualized upper abdomen and no change from the prior CT. Musculoskeletal: Multiple old bilateral rib fractures. No acute fractures. No osteoblastic or osteolytic lesions. IMPRESSION: 1. Small right and trace left pleural effusions. There is associated atelectasis, but no convincing pneumonia and no evidence of pulmonary edema. No abscess. The lung appearance is similar to the previous chest CT. 2. Mild cardiomegaly. 3. Enlarged main pulmonary artery suggesting pulmonary artery hypertension. 4. Stable changes polycystic kidney disease including multiple liver cysts. Stable splenomegaly. Aortic Atherosclerosis (ICD10-I70.0). Electronically Signed   By: Lajean Manes M.D.   On: 04/29/2020 16:57   CT Chest Wo Contrast  Result Date: 04/15/2020 CLINICAL DATA:  Worsening shortness of breath. Increased nausea and vomiting. Fever. Patient reports with recent fall with left-sided rib pain. EXAM: CT CHEST, ABDOMEN AND PELVIS WITHOUT CONTRAST TECHNIQUE: Multidetector CT imaging of the chest, abdomen and pelvis was performed following the standard protocol without IV contrast. COMPARISON:  Radiograph earlier this day. Most recent chest CT 07/27/2014, most recent abdominal CT 03/31/2019 FINDINGS: CT CHEST FINDINGS Cardiovascular: No periaortic stranding to suggest injury. Dilated main pulmonary artery at 4.1 cm. Small circumferential pericardial effusion. Multi chamber cardiomegaly. There are prominent venous collaterals anterior to the left axilla and in the included left upper extremity. Decreased density of the blood pool suggesting anemia. Mediastinum/Nodes: No mediastinal hematoma. No pneumomediastinum. Few scattered mediastinal lymph nodes that are not enlarged by size criteria. Limited assessment for hilar adenopathy in the absence of IV contrast.  No esophageal wall thickening. Subcentimeter nodule in the left lobe of the thyroid gland, does not need further evaluation based on size. Lungs/Pleura: Small bilateral pleural effusions. Compressive atelectasis most prominent in the lower lobes. Additional subsegmental atelectasis in the lingula and left upper lobe. No pneumothorax. No evidence of pulmonary contusion. No pulmonary mass. There is mild central bronchial thickening. Musculoskeletal: Remote left anterior first and second rib fractures. Remote anterior eighth, ninth, and tenth rib fractures with callus. Remote posterior tenth rib fracture. No evidence of acute rib fracture. Remote fractures of right anterior/lateral second through ninth ribs. No acute right rib fracture. No sternal fracture. No fracture of the thoracic spine, included clavicles or  shoulder girdles. CT ABDOMEN PELVIS FINDINGS Hepatobiliary: Multiple hepatic cysts. No evidence of a [place] injury or perihepatic hematoma. Layering hyperdensity in the gallbladder is likely sludge. No pericholecystic inflammation. No biliary dilatation. Pancreas: Pancreas is unremarkable. No peripancreatic inflammation. No evidence of pancreatic injury or pancreatic cyst. Spleen: Splenomegaly with spleen spanning 15.4 cm cranial caudal. No perisplenic hematoma or focal abnormality. Adrenals/Urinary Tract: No adrenal nodule. Polycystic kidneys which are enlarged with innumerable cysts of varying sizes. 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 There is no transplant hydronephrosis. No evidence of renal injury. Urinary bladder is only minimally distended, partially obscured by streak artifact from left hip arthroplasty. No obvious focal bladder abnormality. Stomach/Bowel: Sigmoid colonic diverticulosis without diverticulitis. Mild colonic tortuosity. Small volume of colonic stool. Appendix is normal. Stomach and small bowel are unremarkable. No obstruction, inflammatory change, or evidence of injury.  Vascular/Lymphatic: Aorto bi-iliac atherosclerosis. No aortic aneurysm. No retroperitoneal fluid. No adenopathy. Reproductive: Uterus partially obscured by streak artifact from left hip arthroplasty. No suspicious adnexal mass. Other: Small amount of fluid in the left inguinal canal is unchanged from 12/20/2019 CT. No ascites or free fluid in the abdomen. No free air. There is a small fat containing umbilical hernia. Musculoskeletal: Left hip arthroplasty. The bones are under mineralized. Remote left superior and inferior pubic rami fractures. Remote bilateral sacral insufficiency fractures. No evidence of acute fracture. IMPRESSION: 1. No evidence of acute traumatic injury in the chest, abdomen, or pelvis. 2. Small bilateral pleural effusions with compressive atelectasis. 3. Multi chamber cardiomegaly. Small circumferential pericardial effusion. Dilated main pulmonary artery suggesting pulmonary arterial hypertension. 4. No acute findings in the abdomen/pelvis. 5. Polycystic kidneys and liver. Transplant kidney in the right iliac fossa without transplant hydronephrosis. 6. Splenomegaly. 7. Colonic diverticulosis without diverticulitis. 8. Remote bilateral rib fractures.  No acute rib fracture. Aortic Atherosclerosis (ICD10-I70.0). Electronically Signed   By: Keith Rake M.D.   On: 04/15/2020 21:39   CT Cervical Spine Wo Contrast  Result Date: 04/15/2020 CLINICAL DATA:  Status post trauma. EXAM: CT CERVICAL SPINE WITHOUT CONTRAST TECHNIQUE: Multidetector CT imaging of the cervical spine was performed without intravenous contrast. Multiplanar CT image reconstructions were also generated. COMPARISON:  None. FINDINGS: Alignment: Approximately 1 mm to 2 mm anterolisthesis of the C4 vertebral body is seen on C5. Skull base and vertebrae: No acute fracture. No primary bone lesion or focal pathologic process. Soft tissues and spinal canal: No prevertebral fluid or swelling. No visible canal hematoma. Disc levels:  Mild endplate sclerosis is seen at the level of C5-C6. Moderate severity intervertebral disc space narrowing is also seen at this level. Upper chest: Negative. Other: None. IMPRESSION: 1. No acute osseous abnormality. 2. Approximately 1 mm to 2 mm anterolisthesis of the C4 vertebral body on C5. 3. Moderate severity degenerative changes at the level of C5-C6. Electronically Signed   By: Virgina Norfolk M.D.   On: 04/15/2020 21:26   CT L-SPINE NO CHARGE  Result Date: 04/15/2020 CLINICAL DATA:  Back pain. Recent fall. EXAM: CT LUMBAR SPINE WITHOUT CONTRAST TECHNIQUE: Multidetector CT imaging of the lumbar spine was performed without intravenous contrast administration. Multiplanar CT image reconstructions were also generated. COMPARISON:  Lumbar spine radiographs 12/19/2019. CT pelvis 12/20/2019. FINDINGS: Segmentation: 5 lumbar type vertebrae. Alignment: Normal. Vertebrae: Chronic bilateral sacral fractures with sclerosis and angular deformity of the sacrum anteriorly at the S2 level. Chronic bilateral L5 transverse process fractures. No acute lumbar spine fracture. Small Schmorl's nodes at T11-12 and T12-L1. Paraspinal and other soft tissues:  No acute paraspinal soft tissue abnormality identified. Intra-abdominal and pelvic contents reported separately. Disc levels: Intervertebral disc space heights are preserved in the lumbar spine. Disc bulging and severe facet hypertrophy at L4-5 result in mild right greater than left neural foraminal stenosis without evidence of significant spinal stenosis. There is mild disc bulging at L3-4 with minimal to mild right neural foraminal stenosis. IMPRESSION: 1. No acute osseous abnormality identified in the lumbar spine. 2. Chronic bilateral sacral fractures and chronic bilateral L5 transverse process fractures. 3. Severe L4-5 facet hypertrophy with mild right greater than left neural foraminal stenosis. Electronically Signed   By: Logan Bores M.D.   On: 04/15/2020 21:32    Portable chest 1 View  Result Date: 05/14/2020 CLINICAL DATA:  Shortness of breath EXAM: PORTABLE CHEST 1 VIEW COMPARISON:  04/23/2020 FINDINGS: Cardiac shadow is mildly prominent but stable accentuated by the portable technique. Overall inspiratory effort is again poor although previously seen atelectasis has improved. No focal confluent infiltrate or sizable effusion is noted. No bony abnormality is seen. IMPRESSION: No acute abnormality noted. Electronically Signed   By: Inez Catalina M.D.   On: 05/14/2020 09:28   DG Chest Portable 1 View  Result Date: 05/13/2020 CLINICAL DATA:  Short of breath chest pain EXAM: PORTABLE CHEST 1 VIEW COMPARISON:  04/29/2020 FINDINGS: Hypoventilation with decreased lung volume. Mild bibasilar atelectasis similar to the prior study Negative for heart failure or pneumonia. Small right pleural effusion. IMPRESSION: Hypoventilation with bibasilar atelectasis unchanged from the prior study Electronically Signed   By: Franchot Gallo M.D.   On: 05/13/2020 10:54   DG Chest Port 1 View  Result Date: 04/29/2020 CLINICAL DATA:  Shortness of breath EXAM: PORTABLE CHEST 1 VIEW COMPARISON:  Apr 16, 2020 FINDINGS: There is atelectatic change in the lower lung zones bilaterally, stable. No edema or airspace opacity. Heart is mildly enlarged. The pulmonary vascularity is within normal limits. No adenopathy. No bone lesions. IMPRESSION: Mild bibasilar atelectasis. No edema or airspace opacity. Heart mildly enlarged with pulmonary vascularity within normal limits. No evident adenopathy. Electronically Signed   By: Lowella Grip III M.D.   On: 04/29/2020 14:39   DG Chest Port 1 View  Result Date: 04/16/2020 CLINICAL DATA:  Shortness of breath EXAM: PORTABLE CHEST 1 VIEW COMPARISON:  Apr 16, 2020 study obtained earlier in the day FINDINGS: Heart is mildly enlarged with mild pulmonary venous hypertension. There is slight interstitial edema. No appreciable consolidation or pleural  effusion. There is mild atelectatic change in the lower lung regions bilaterally. No adenopathy. No bone lesions. IMPRESSION: Mild cardiomegaly with a degree of pulmonary vascular congestion. Slight interstitial edema. There may be a degree of congestive heart failure. Suspect somewhat less edema compared to earlier in the day. No new opacity. No consolidation. Mild atelectatic change in each lower lung region. Electronically Signed   By: Lowella Grip III M.D.   On: 04/16/2020 08:46   DG Chest Portable 1 View  Result Date: 04/15/2020 CLINICAL DATA:  64 year old female with history of trauma from a fall complaining of left-sided rib pain. EXAM: PORTABLE CHEST 1 VIEW COMPARISON:  Chest x-ray 04/07/2020. FINDINGS: Lung volumes are low. Linear opacity in the left mid lung, new compared to the recent prior study, presumably a focus of subsegmental atelectasis. No acute consolidative airspace disease. No pleural effusions. Cephalization of the pulmonary vasculature, without frank pulmonary edema. Heart size is mildly enlarged. Upper mediastinal contours are within normal limits. An old healed left-sided rib fractures are noted.  No definite acute displaced rib fractures are identified. IMPRESSION: 1. Low lung volumes with new area of subsegmental atelectasis in the left mid lung. 2. Cardiomegaly with pulmonary venous congestion, but no frank pulmonary edema. 3. Multiple old healed left-sided rib fractures, similar to prior chest CT 07/27/2014. Electronically Signed   By: Vinnie Langton M.D.   On: 04/15/2020 19:23   ECHOCARDIOGRAM COMPLETE  Result Date: 04/17/2020    ECHOCARDIOGRAM REPORT   Patient Name:   SHAUNTELLE JAMERSON [place] Date of Exam: 04/17/2020 Medical Rec #:  [id number]         Height:       61.0 in Accession #:    [id number]        Weight:       159.0 lb Date of Birth:  [birth year]         BSA:          1.713 m Patient Age:    8 years          BP:           138/74 mmHg Patient Gender: F                  HR:           85 bpm. Exam Location:  Inpatient Procedure: 2D Echo, Color Doppler and Cardiac Doppler Indications:    M22.63 Acute diastolic (congestive) heart failure  History:        Patient has prior history of Echocardiogram examinations, most                 recent 04/21/2019. CHF, COPD; Risk Factors:Dyslipidemia.  Sonographer:    Raquel Sarna Senior RDCS Referring Phys: [id number] Sherryll Burger [facility]  Sonographer Comments: Supine on CPAP. IMPRESSIONS  1. Left ventricular ejection fraction, by estimation, is 55 to 60%. The left ventricle has normal function. The left ventricle has no regional wall motion abnormalities. Left ventricular diastolic parameters are consistent with Grade II diastolic dysfunction (pseudonormalization). Elevated left atrial pressure.  2. Right ventricular systolic function is normal. The right ventricular size is normal. There is mildly elevated pulmonary artery systolic pressure. The estimated right ventricular systolic pressure is 56.3 mmHg.  3. Left atrial size was mildly dilated.  4. The mitral valve is degenerative. Trivial mitral valve regurgitation. No evidence of mitral stenosis.  5. The aortic valve is tricuspid. Aortic valve regurgitation is not visualized. No aortic stenosis is present.  6. The inferior vena cava is dilated in size with <50% respiratory variability, suggesting right atrial pressure of 15 mmHg. Comparison(s): No significant change from prior study. FINDINGS  Left Ventricle: Left ventricular ejection fraction, by estimation, is 55 to 60%. The left ventricle has normal function. The left ventricle has no regional wall motion abnormalities. The left ventricular internal cavity size was normal in size. There is  no left ventricular hypertrophy. Left ventricular diastolic parameters are consistent with Grade II diastolic dysfunction (pseudonormalization). Elevated left atrial pressure. Right Ventricle: The right ventricular size is normal. No increase in right ventricular wall  thickness. Right ventricular systolic function is normal. There is mildly elevated pulmonary artery systolic pressure. The tricuspid regurgitant velocity is 2.62  m/s, and with an assumed right atrial pressure of 15 mmHg, the estimated right ventricular systolic pressure is 89.3 mmHg. Left Atrium: Left atrial size was mildly dilated. Right Atrium: Right atrial size was normal in size. Pericardium: Trivial pericardial effusion is present. Presence of pericardial fat pad. Mitral Valve: The mitral valve is degenerative in  appearance. Moderate mitral annular calcification. Trivial mitral valve regurgitation. No evidence of mitral valve stenosis. Tricuspid Valve: The tricuspid valve is grossly normal. Tricuspid valve regurgitation is trivial. No evidence of tricuspid stenosis. Aortic Valve: The aortic valve is tricuspid. Aortic valve regurgitation is not visualized. No aortic stenosis is present. Pulmonic Valve: The pulmonic valve was grossly normal. Pulmonic valve regurgitation is not visualized. No evidence of pulmonic stenosis. Aorta: The aortic root and ascending aorta are structurally normal, with no evidence of dilitation. Venous: The inferior vena cava is dilated in size with less than 50% respiratory variability, suggesting right atrial pressure of 15 mmHg. IAS/Shunts: The atrial septum is grossly normal. Additional Comments: There is a small pleural effusion in the left lateral region.  LEFT VENTRICLE PLAX 2D LVIDd:         5.30 cm  Diastology LVIDs:         2.90 cm  LV e' lateral:   5.33 cm/s LV PW:         0.90 cm  LV E/e' lateral: 23.1 LV IVS:        1.10 cm  LV e' medial:    6.42 cm/s LVOT diam:     2.00 cm  LV E/e' medial:  19.2 LV SV:         82 LV SV Index:   48 LVOT Area:     3.14 cm  RIGHT VENTRICLE RV S prime:     9.25 cm/s TAPSE (M-mode): 1.6 cm LEFT ATRIUM             Index       RIGHT ATRIUM           Index LA diam:        4.40 cm 2.57 cm/m  RA Area:     15.20 cm LA Vol (A2C):   70.3 ml 41.04  ml/m RA Volume:   36.30 ml  21.19 ml/m LA Vol (A4C):   55.6 ml 32.46 ml/m LA Biplane Vol: 65.5 ml 38.24 ml/m  AORTIC VALVE LVOT Vmax:   130.00 cm/s LVOT Vmean:  87.300 cm/s LVOT VTI:    0.260 m  AORTA Ao Root diam: 3.60 cm Ao Asc diam:  3.60 cm MITRAL VALVE                TRICUSPID VALVE MV Area (PHT): 3.08 cm     TR Peak grad:   27.5 mmHg MV Decel Time: 246 msec     TR Vmax:        262.00 cm/s MV E velocity: 123.00 cm/s MV A velocity: 114.00 cm/s  SHUNTS MV E/A ratio:  1.08         Systemic VTI:  0.26 m                             Systemic Diam: 2.00 cm Eleonore Chiquito MD Electronically signed by Eleonore Chiquito MD Signature Date/Time: 04/17/2020/1:27:32 PM    Final    DG Hip Unilat W or Wo Pelvis 2-3 Views Left  Result Date: 04/15/2020 CLINICAL DATA:  Status post fall 3 days ago. EXAM: DG HIP (WITH OR WITHOUT PELVIS) 2-3V LEFT COMPARISON:  February 01, 2020 FINDINGS: There is no evidence of an acute hip fracture or dislocation. Chronic fractures of the left superior and left inferior pubic rami are present. Fragmentation of the left greater trochanter is again noted. 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 There is no evidence  of surrounding lucency to suggest the presence of hardware loosening or infection. There is no evidence of arthropathy or other focal bone abnormality. Radiopaque surgical clips are seen overlying the right acetabulum and inferior aspect of the sacrum on the right. IMPRESSION: 1. No evidence of an acute osseous abnormality. 2. 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 Electronically Signed   By: Virgina Norfolk M.D.   On: 04/15/2020 21:38   VAS [place] LOWER EXTREMITY VENOUS (DVT)  Result Date: 05/13/2020  Lower Venous DVTStudy Indications: SOB, and sepsis.  Comparison Study: 12/16/19, 02/02/20, 03/16/20 all negative for DVT Performing Technologist: June Leap RDMS, RVT  Examination Guidelines: A complete evaluation includes B-mode imaging, spectral Doppler,  color Doppler, and power Doppler as needed of all accessible portions of each vessel. Bilateral testing is considered an integral part of a complete examination. Limited examinations for reoccurring indications may be performed as noted. The reflux portion of the exam is performed with the patient in reverse Trendelenburg.  +---------+---------------+---------+-----------+----------+--------------+ RIGHT    CompressibilityPhasicitySpontaneityPropertiesThrombus Aging +---------+---------------+---------+-----------+----------+--------------+ CFV      Full           Yes      Yes                                 +---------+---------------+---------+-----------+----------+--------------+ SFJ      Full                                                        +---------+---------------+---------+-----------+----------+--------------+ FV Prox  Full                                                        +---------+---------------+---------+-----------+----------+--------------+ FV Mid   Full                                                        +---------+---------------+---------+-----------+----------+--------------+ FV DistalFull                                                        +---------+---------------+---------+-----------+----------+--------------+ PFV      Full                                                        +---------+---------------+---------+-----------+----------+--------------+ POP      Full           Yes      Yes                                 +---------+---------------+---------+-----------+----------+--------------+  PTV      Full                                                        +---------+---------------+---------+-----------+----------+--------------+ PERO     Full                                                        +---------+---------------+---------+-----------+----------+--------------+    +---------+---------------+---------+-----------+----------+--------------+ LEFT     CompressibilityPhasicitySpontaneityPropertiesThrombus Aging +---------+---------------+---------+-----------+----------+--------------+ CFV      Full           Yes      Yes                                 +---------+---------------+---------+-----------+----------+--------------+ SFJ      Full                                                        +---------+---------------+---------+-----------+----------+--------------+ FV Prox  Full                                                        +---------+---------------+---------+-----------+----------+--------------+ FV Mid   Full                                                        +---------+---------------+---------+-----------+----------+--------------+ FV DistalFull                                                        +---------+---------------+---------+-----------+----------+--------------+ PFV      Full                                                        +---------+---------------+---------+-----------+----------+--------------+ POP      Full           Yes      Yes                                 +---------+---------------+---------+-----------+----------+--------------+ PTV      Full                                                        +---------+---------------+---------+-----------+----------+--------------+  PERO     Full                                                        +---------+---------------+---------+-----------+----------+--------------+     Summary: BILATERAL: - No evidence of deep vein thrombosis seen in the lower extremities, bilaterally. -No evidence of popliteal cyst, bilaterally.   *See table(s) above for measurements and observations. Electronically signed by Ruta Hinds MD on 05/13/2020 at 3:35:07 PM.    Final     Time Spent in minutes  30   Lala Lund M.D on  05/14/2020 at 1:12 PM  To page go to [email or web address] - password [email or web address]

## 2020-05-14 NOTE — Consult Note (Signed)
WOC Nurse Consult Note: Patient receiving care in [facility] [phone]. Reason for Consult: unstageable wound on coccyx Wound type: unstageable wound to coccyx Pressure Injury POA: Yes Measurement: 2.2 cm x 2 cm; nearly 100% pink Wound bed: pink with a very small area of darkened tissue. Drainage (amount, consistency, odor) none Periwound: intact Dressing procedure/placement/frequency: Apply Santyl to the coccyx wound in a nickel thick layer. Cover with a saline moistened gauze, then foam dressing.  Change gauze daily--the foam dressing can be used up to 3 days. Monitor the wound area(s) for worsening of condition such as: Signs/symptoms of infection,  Increase in size,  Development of or worsening of odor, Development of pain, or increased pain at the affected locations.  Notify the medical team if any of these develop.  Thank you for the consult.  Discussed plan of care with the patient and bedside nurse.  [place] nurse will not follow at this time.  Please re-consult the [place] team if needed.  Val Riles, RN, MSN, CWOCN, CNS-BC, pager [phone]

## 2020-05-14 NOTE — Evaluation (Signed)
Physical Therapy Evaluation Patient Details Name: April [name] MRN: [id number] DOB: 08-14-1956 Today's Date: 05/14/2020   History of Present Illness  Patient is a 64 y/o female who presents with SOB and chest pain. Admitted with sepsis secondary to UTI and Acute on chronic hypoxic and hypercapnic respiratory failure secondary to acute pulmonary edema versus less likely atypical pneumonia. . CXR-pulmonary edema. PMH includes polycystic kidney disease s/p renal transplant (on prednisone, mycophenalate, prograf), COPD on 2L 02 baseline, diastolic CHF, chronic hypoxic respiratory failure, obesity, recurrent UTI.  Clinical Impression  Patient presents with lethargy, generalized weakness, pain, decreased activity tolerance and impaired mobility s/p above. Pt has been at [facility] and working with therapies there. Was walking <50' with RW and assisting with dressing (UB not able to do LB). Reports 1 fall at SNF. Today, pt requires Mod A for bed mobility and MOd A to scoot along side bed for repositioning. Pt declining standing today due to weakness, "I just can't." Would benefit from return to SNF to maximize independence and mobility prior to return home. Will follow acutely.    Follow Up Recommendations SNF    Equipment Recommendations  None recommended by PT    Recommendations for Other Services       Precautions / Restrictions Precautions Precautions: Fall Restrictions Weight Bearing Restrictions: No      Mobility  Bed Mobility Overal bed mobility: Needs Assistance Bed Mobility: Supine to Sit;Sit to Sidelying     Supine to sit: Mod assist;HOB elevated   Sit to sidelying: Min guard General bed mobility comments: Assist with trunk to get to EOB. Able to bring LEs into bed to return to supine. Use of rail.  Transfers Overall transfer level: Needs assistance Equipment used: None Transfers: Lateral/Scoot Transfers          Lateral/Scoot Transfers: Mod assist General  transfer comment: Declined standing today, "I know I just can't." Tolerated scooting along side bed using UEs and assist from therapist using pad x5.  Ambulation/Gait                Stairs            Wheelchair Mobility    Modified Rankin (Stroke Patients Only)       Balance Overall balance assessment: Needs assistance Sitting-balance support: Feet unsupported;No upper extremity supported Sitting balance-Leahy Scale: Good Sitting balance - Comments: Supervision at EOB.                                     Pertinent Vitals/Pain Pain Assessment: Faces Faces Pain Scale: Hurts a little bit Pain Location: ribs Pain Descriptors / Indicators: Grimacing;Guarding;Sore Pain Intervention(s): Monitored during session;Limited activity within patient's tolerance;Repositioned    Home Living Family/patient expects to be discharged to:: Skilled nursing facility                      Prior Function Level of Independence: Needs assistance   Gait / Transfers Assistance Needed: pt working with PT at SNF, was able to ambulate ~30' with RW with assist. 1 fall at [place] [facility] [place] Needed: Able to do UB dressing, assist needed with LB dressing.        Hand Dominance   Dominant Hand: Right    Extremity/Trunk Assessment   Upper Extremity Assessment Upper Extremity Assessment: Defer to OT evaluation    Lower Extremity Assessment Lower Extremity Assessment: Generalized  weakness RLE Sensation: decreased light touch LLE Sensation: decreased light touch    Cervical / Trunk Assessment Cervical / Trunk Assessment: Normal  Communication   Communication: No difficulties  Cognition Arousal/Alertness: Lethargic Behavior During Therapy: WFL for tasks assessed/performed Overall Cognitive Status: Within Functional Limits for tasks assessed                                 General Comments: Reports feeling very tired, sore and  weak. A&Ox4. Needs cues to stay awake at times.      General Comments General comments (skin integrity, edema, etc.): SP02 99% on 2L/min 02 [place] throughout activity. HR stable in 80s with 1 missed beat noted.    Exercises     Assessment/Plan    PT Assessment Patient needs continued PT services  PT Problem List Decreased strength;Decreased activity tolerance;Decreased balance;Decreased mobility;Decreased cognition;Decreased knowledge of use of DME;Decreased knowledge of precautions;Cardiopulmonary status limiting activity;Pain;Impaired sensation       PT Treatment Interventions DME instruction;Gait training;Functional mobility training;Therapeutic activities;Therapeutic exercise;Balance training;Neuromuscular re-education;Cognitive remediation;Patient/family education    PT Goals (Current goals can be found in the Care Plan section)  Acute Rehab PT Goals Patient Stated Goal: return to SNF, "dont want to be a burden on my family" PT Goal Formulation: With patient Time For Goal Achievement: 05/28/20 Potential to Achieve Goals: Fair    Frequency Min 2X/week   Barriers to discharge        Co-evaluation               AM-PAC PT "6 Clicks" Mobility  Outcome Measure Help needed turning from your back to your side while in a flat bed without using bedrails?: A Little Help needed moving from lying on your back to sitting on the side of a flat bed without using bedrails?: A Lot Help needed moving to and from a bed to a chair (including a wheelchair)?: A Lot Help needed standing up from a chair using your arms (e.g., wheelchair or bedside chair)?: A Lot Help needed to walk in hospital room?: A Lot Help needed climbing 3-5 steps with a railing? : Total 6 Click Score: 12    End of Session Equipment Utilized During Treatment: Oxygen Activity Tolerance: Patient limited by fatigue;Patient limited by pain Patient left: in bed;with call bell/phone within reach;with bed alarm set Nurse  Communication: Mobility status PT Visit Diagnosis: Other abnormalities of gait and mobility (R26.89);Repeated falls (R29.6);Muscle weakness (generalized) (M62.81);Pain Pain - part of body:  (ribs)    Time: 4854-6270 PT Time Calculation (min) (ACUTE ONLY): 23 min   Charges:   PT Evaluation $PT Eval Moderate Complexity: 1 Mod PT Treatments $Therapeutic Activity: 8-22 mins        Marisa Severin, PT, DPT Acute Rehabilitation Services Pager [phone] Office [phone]      Marguarite Arbour A [place] Rommel 05/14/2020, 9:00 AM

## 2020-05-14 NOTE — Progress Notes (Signed)
NAME:  April [name], MRN:  [id number], DOB:  [birth year], LOS: 1 ADMISSION DATE:  05/13/2020, CONSULTATION DATE:  05/13/20 REFERRING MD:  Roosevelt Locks  CHIEF COMPLAINT:  SOB   Brief History   April [name] is a 64 y.o. female who was admitted 6/21 for acute on chronic hypoxic respiratory failure.   History of present illness   April [name] is a 64 y.o. female who has a PMH including but not limited to PCKD s/p renal transplant on chronic prednisone, mycophenalate, prograf, COPD on 2L O2 (followed by Dr. Vaughan Browner), dCHF, pulm HTN (see "past medical history" for rest).  She presented to [facility] ED 6/21 with dyspnea, wheezing, fever/chills x 2 days.  Denied any cough, chest pain, myalgias, exposures to known sick contacts.  She has not received a COVID vaccine.  In ED, she was hypoxic on baseline 2L so was increased to 4L.  Due to increased WOB with RR in high 20s to low 30's, she was placed on BiPAP.  ABG was reassuring (7.34 / 54 /154).  CXR showed bibasilar atelectasis with some vascular congestion.  LE duplex was preliminary negative.  Due to need for BiPAP and concern she may deteriorate further, PCCM was asked to see in consultation.  Past Medical History  has End stage renal disease ([place]); 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, implant, and graft; Chronic cough; Bronchitis, mucopurulent recurrent ([place]); Pneumonia; Asthma, mild intermittent; GERD without esophagitis; Renal transplant recipient; Hyperlipidemia; Depression; Chronic diastolic CHF (congestive heart failure) ([place]); Influenza B; Persistent asthma with status asthmaticus; Nausea vomiting and diarrhea; Lower urinary tract infectious disease; Asthma, chronic, unspecified asthma severity, with acute exacerbation; SIRS (systemic inflammatory response syndrome) ([place]); Bilateral closed proximal tibial fracture; Closed right ankle fracture; Fracture; Acute respiratory failure with hypoxia ([place]); Acute on chronic diastolic CHF  (congestive heart failure) ([place]); Hyponatremia; Medication management; Physical deconditioning; Closed left hip fracture ([place]); Left wrist fracture; Status post total hip replacement, left; Post-op pain; Acute blood loss anemia; Polycystic kidney; Left displaced femoral neck fracture ([place]); Fracture of left superior pubic ramus ([place]); Pain; Anxiety state; Sepsis ([place]); Hypoxia; Shortness of breath; Essential hypertension; Glaucoma; SVT (supraventricular tachycardia) ([place]); Anemia of chronic disease; Palliative care encounter; Goals of care, counseling/discussion; PNA (pneumonia); Acute on chronic respiratory failure with hypoxia ([place]); COPD with acute exacerbation ([place]); Pressure injury of skin; Sepsis due to gram-negative UTI ([place]); Pericardial effusion; Chronic respiratory failure with hypoxia ([place]); Proteinuria; Acute respiratory distress; Asthma; Decubitus ulcer of coccyx, unstageable ([place]); Acute on chronic respiratory failure with hypoxia and hypercapnia ([place]); and Immunocompromised state ([place]) on their problem list.  [facility] Events   6/21 > admit.  Consults:  None.  Procedures:  None.  Significant Diagnostic Tests:  LE duplex 6/21 > prelim negative.  Micro Data:  COVID 6/21 >  Blood 6/21 >  Urine 6/21 >   Antimicrobials:  Vanc 6/21 >  Cefepime 6/21 >    Interim history/subjective:  Tolerated BiPAP well overnight. Comfortable this AM.  Feels improved on 3L O2 this AM.  Objective:  Blood pressure 101/64, pulse 84, temperature 98.6 F (37 C), temperature source Oral, resp. rate (!) 21, SpO2 100 %.    FiO2 (%):  [40 %] 40 %   Intake/Output Summary (Last 24 hours) at 05/14/2020 1116 Last data filed at 05/14/2020 1007 Gross per 24 hour  Intake 1100 ml  Output --  Net 1100 ml   There were no vitals filed for this visit.  Examination: General: Adult female,  in NAD. Neuro: A&O x 3, no deficits. HEENT: [place]/AT. Sclerae anicteric.  BiPAP in place. Cardiovascular:  RRR, no M/R/G.  Lungs: Respirations even and unlabored.  CTA bilaterally, No W/R/R.   Abdomen: BS x 4, soft, NT/ND.  Musculoskeletal: No gross deformities, no edema.  Skin: Intact, warm, no rashes.  Assessment & Plan:   Acute on chronic hypoxic respiratory failure - multifactorial with underlying COPD + CHF. - Continue supplemental O2 as needed to maintain SpO2 > 88 - 90%. - Continue steroids. - Would be cautious with fluid administration. - Continue BD's but hold home symbicort and budesonide while on systemic IV steroids. - Bronchial hygiene. - OK to continue empiric abx given immunosuppression and leukocytosis but doubt pulmonary source / bacterial PNA.  Would recommend further workup for source ID. - F/u as outpatient following discharge (appointment made for 06/03/20 at 9:00AM with Wyn Quaker, NP).  Rest per primary team.  Nothing further to add.  PCCM will sign off.  Please do not hesitate to call us back if we can be of any further assistance.   Best Practice:  Diet: NPO. Pain/Anxiety/Delirium protocol (if indicated): N/A. VAP protocol (if indicated): N/A. DVT prophylaxis: SCD's / Lovenox. GI prophylaxis: N/A. Glucose control: SSI. Mobility: Bedrest. Code Status: Full. Family Communication: Per primary. Disposition: Progressive.   Montey Hora, [facility] 05/14/2020, 11:16 AM

## 2020-05-15 ENCOUNTER — Inpatient Hospital Stay (HOSPITAL_COMMUNITY): Payer: Medicare Other

## 2020-05-15 DIAGNOSIS — R509 Fever, unspecified: Secondary | ICD-10-CM | POA: Diagnosis present

## 2020-05-15 LAB — URINALYSIS, ROUTINE W REFLEX MICROSCOPIC
Bilirubin Urine: NEGATIVE
Glucose, UA: NEGATIVE mg/dL
Hgb urine dipstick: NEGATIVE
Ketones, ur: NEGATIVE mg/dL
Leukocytes,Ua: NEGATIVE
Nitrite: NEGATIVE
Protein, ur: NEGATIVE mg/dL
Specific Gravity, Urine: 1.029 (ref 1.005–1.030)
pH: 5 (ref 5.0–8.0)

## 2020-05-15 LAB — CBC WITH DIFFERENTIAL/PLATELET
Abs Immature Granulocytes: 0.06 10*3/uL (ref 0.00–0.07)
Basophils Absolute: 0 10*3/uL (ref 0.0–0.1)
Basophils Relative: 0 %
Eosinophils Absolute: 0 10*3/uL (ref 0.0–0.5)
Eosinophils Relative: 0 %
HCT: 22.5 % — ABNORMAL LOW (ref 36.0–46.0)
Hemoglobin: 6.7 g/dL — CL (ref 12.0–15.0)
Immature Granulocytes: 1 %
Lymphocytes Relative: 3 %
Lymphs Abs: 0.3 10*3/uL — ABNORMAL LOW (ref 0.7–4.0)
MCH: 31 pg (ref 26.0–34.0)
MCHC: 29.8 g/dL — ABNORMAL LOW (ref 30.0–36.0)
MCV: 104.2 fL — ABNORMAL HIGH (ref 80.0–100.0)
Monocytes Absolute: 0.1 10*3/uL (ref 0.1–1.0)
Monocytes Relative: 1 %
Neutro Abs: 7.8 10*3/uL — ABNORMAL HIGH (ref 1.7–7.7)
Neutrophils Relative %: 95 %
Platelets: 149 10*3/uL — ABNORMAL LOW (ref 150–400)
RBC: 2.16 MIL/uL — ABNORMAL LOW (ref 3.87–5.11)
RDW: 22.5 % — ABNORMAL HIGH (ref 11.5–15.5)
WBC: 8.3 10*3/uL (ref 4.0–10.5)
nRBC: 0 % (ref 0.0–0.2)

## 2020-05-15 LAB — PROCALCITONIN: Procalcitonin: 3.96 ng/mL

## 2020-05-15 LAB — C-REACTIVE PROTEIN: CRP: 17.7 mg/dL — ABNORMAL HIGH

## 2020-05-15 LAB — COMPREHENSIVE METABOLIC PANEL WITH GFR
ALT: 14 U/L (ref 0–44)
AST: 14 U/L — ABNORMAL LOW (ref 15–41)
Albumin: 1.8 g/dL — ABNORMAL LOW (ref 3.5–5.0)
Alkaline Phosphatase: 54 U/L (ref 38–126)
Anion gap: 14 (ref 5–15)
BUN: 45 mg/dL — ABNORMAL HIGH (ref 8–23)
CO2: 19 mmol/L — ABNORMAL LOW (ref 22–32)
Calcium: 7.7 mg/dL — ABNORMAL LOW (ref 8.9–10.3)
Chloride: 95 mmol/L — ABNORMAL LOW (ref 98–111)
Creatinine, Ser: 0.93 mg/dL (ref 0.44–1.00)
GFR calc Af Amer: >60 mL/min
GFR calc non Af Amer: >60 mL/min
Glucose, Bld: 216 mg/dL — ABNORMAL HIGH (ref 70–99)
Potassium: 3.4 mmol/L — ABNORMAL LOW (ref 3.5–5.1)
Sodium: 128 mmol/L — ABNORMAL LOW (ref 135–145)
Total Bilirubin: 3.9 mg/dL — ABNORMAL HIGH (ref 0.3–1.2)
Total Protein: 4.9 g/dL — ABNORMAL LOW (ref 6.5–8.1)

## 2020-05-15 LAB — HEMOGLOBIN AND HEMATOCRIT, BLOOD
HCT: 29 % — ABNORMAL LOW (ref 36.0–46.0)
Hemoglobin: 8.8 g/dL — ABNORMAL LOW (ref 12.0–15.0)

## 2020-05-15 LAB — GLUCOSE, CAPILLARY
Glucose-Capillary: 208 mg/dL — ABNORMAL HIGH (ref 70–99)
Glucose-Capillary: 247 mg/dL — ABNORMAL HIGH (ref 70–99)
Glucose-Capillary: 195 mg/dL — ABNORMAL HIGH (ref 70–99)
Glucose-Capillary: 241 mg/dL — ABNORMAL HIGH (ref 70–99)

## 2020-05-15 LAB — URIC ACID: Uric Acid, Serum: 5.8 mg/dL (ref 2.5–7.1)

## 2020-05-15 LAB — PREPARE RBC (CROSSMATCH)

## 2020-05-15 LAB — MAGNESIUM: Magnesium: 2 mg/dL (ref 1.7–2.4)

## 2020-05-15 LAB — SODIUM, URINE, RANDOM: Sodium, Ur: 21 mmol/L

## 2020-05-15 LAB — D-DIMER, QUANTITATIVE: D-Dimer, Quant: 1.47 ug{FEU}/mL — ABNORMAL HIGH (ref 0.00–0.50)

## 2020-05-15 LAB — OSMOLALITY: Osmolality: 318 mOsm/kg — ABNORMAL HIGH (ref 275–295)

## 2020-05-15 LAB — OSMOLALITY, URINE: Osmolality, Ur: 440 mOsm/kg (ref 300–900)

## 2020-05-15 LAB — BRAIN NATRIURETIC PEPTIDE: B Natriuretic Peptide: 534.1 pg/mL — ABNORMAL HIGH (ref 0.0–100.0)

## 2020-05-15 MED ORDER — POTASSIUM CHLORIDE CRYS ER 20 MEQ PO TBCR
40.0000 meq | EXTENDED_RELEASE_TABLET | Freq: Once | ORAL | Status: AC
  Administered 2020-05-15: 40 meq via ORAL
  Filled 2020-05-15: qty 2

## 2020-05-15 MED ORDER — LACTATED RINGERS IV SOLN: INTRAVENOUS | Status: DC

## 2020-05-15 MED ORDER — PANTOPRAZOLE SODIUM 40 MG IV SOLR
40.0000 mg | Freq: Two times a day (BID) | INTRAVENOUS | Status: DC
  Administered 2020-05-15 – 2020-05-19 (×8): 40 mg via INTRAVENOUS
  Filled 2020-05-15 (×8): qty 40

## 2020-05-15 MED ORDER — SODIUM CHLORIDE 0.9% IV SOLUTION: Freq: Once | INTRAVENOUS | Status: AC

## 2020-05-15 MED ORDER — ASPIRIN 81 MG PO CHEW
81.0000 mg | CHEWABLE_TABLET | Freq: Every day | ORAL | Status: DC
  Administered 2020-05-19 – 2020-05-22 (×4): 81 mg via ORAL
  Filled 2020-05-15 (×4): qty 1

## 2020-05-15 MED ORDER — METHYLPREDNISOLONE SODIUM SUCC 40 MG IJ SOLR
40.0000 mg | Freq: Two times a day (BID) | INTRAMUSCULAR | Status: DC
  Administered 2020-05-15: 40 mg via INTRAVENOUS
  Filled 2020-05-15: qty 1

## 2020-05-15 NOTE — Progress Notes (Signed)
Patient going to MRI. Alert and oriented, no acute distress noted, no complaints.

## 2020-05-15 NOTE — Progress Notes (Signed)
Inpatient Diabetes Program Recommendations  AACE/ADA: New Consensus Statement on Inpatient Glycemic Control (2015)  Target Ranges:  Prepandial:   less than 140 mg/dL      Peak postprandial:   less than 180 mg/dL (1-2 hours)      Critically ill patients:  140 - 180 mg/dL   Lab Results  Component Value Date   GLUCAP 208 (H) 05/15/2020   HGBA1C 5.9 (H) 04/29/2020    Review of Glycemic Control Results for CARINNA, NEWHART (MRN [id number]) as of 05/15/2020 07:52  Ref. Range 05/14/2020 08:29 05/14/2020 11:49 05/14/2020 17:52 05/14/2020 21:22 05/15/2020 07:33  Glucose-Capillary Latest Ref Range: 70 - 99 mg/dL 121 (H) 216 (H) 369 (H) 299 (H) 208 (H)   Diabetes history:  DM2  Outpatient Diabetes medications:  Novolog 2-12 units tid 0-200-0 units 201-250-2 units 251-300-4 units  304-350-6 units 351-400-8 units 401-450-10 units 451-500-12 units >500 call MD and give 14 units Prednisone 7.5 mg daily  Current orders for Inpatient glycemic control:  Novolog 0-9 tid Solumedrol 60 mg q12h  Inpatient Diabetes Program Recommendations:     CBG's elevated with Solu-medrol.  Please consider,  1) Novolog 3 units tid with meals or supplements if eats or drinks at least 50% and cbg > 80mg /dl  2) Please change diet to carb modified  Will continue to follow while inpatient.  Thank you, Reche Dixon, RN, BSN Diabetes Coordinator Inpatient Diabetes Program [phone] (team pager from 8a-5p)

## 2020-05-15 NOTE — NC FL2 (Signed)
[place] LEVEL OF CARE SCREENING TOOL     IDENTIFICATION  Patient Name: April [name] Birthdate: 04/30/1956 Sex: female Admission Date (Current Location): 05/13/2020  [facility] and [place] Number:  Herbalist and Address:  [facility] [place], [place] [address] [place], [place] [address]      Provider Number: [id number]  Attending Physician Name and Address:  Thurnell Lose, MD  Relative Name and Phone Number:  Shaleen Talamantez - daughter; [phone]    Current Level of Care: Hospital Recommended Level of Care: [place] Prior Approval Number:    Date Approved/Denied:   PASRR Number: [id number]  Discharge Plan: SNF    Current Diagnoses: Patient Active Problem List   Diagnosis Date Noted  . Fever 05/15/2020  . Acute on chronic respiratory failure with hypoxia and hypercapnia (HCC)   . Immunocompromised state ([place])   . Decubitus ulcer of coccyx, unstageable ([place]) 05/01/2020  . Asthma 04/29/2020  . Sepsis due to gram-negative UTI ([place]) 04/16/2020  . Pericardial effusion 04/16/2020  . Chronic respiratory failure with hypoxia ([place]) 04/16/2020  . Proteinuria 04/16/2020  . Acute respiratory distress 04/16/2020  . Pressure injury of skin 04/08/2020  . COPD with acute exacerbation ([place]) 04/07/2020  . Acute on chronic respiratory failure with hypoxia ([place]) 03/14/2020  . PNA (pneumonia) 03/13/2020  . SVT (supraventricular tachycardia) ([place])   . Anemia of chronic disease   . Palliative care encounter   . Goals of care, counseling/discussion   . Essential hypertension 02/08/2020  . Glaucoma 02/08/2020  . Sepsis ([place]) 01/31/2020  . Hypoxia   . Shortness of breath   . Fracture of left superior pubic ramus (HCC)   . Pain   . Anxiety state   . Left displaced femoral neck fracture ([place]) 12/15/2019  . Status post total hip replacement, left   . Post-op pain   . Acute blood loss anemia   . Polycystic kidney   .  Closed left hip fracture ([place]) 12/07/2019  . Left wrist fracture 12/07/2019  . Medication management 09/22/2019  . Physical deconditioning 09/22/2019  . Hyponatremia 06/29/2019  . Acute respiratory failure with hypoxia ([place]) 04/19/2019  . Acute on chronic diastolic CHF (congestive heart failure) ([place]) 04/19/2019  . Bilateral closed proximal tibial fracture 12/21/2018  . Closed right ankle fracture 12/21/2018  . Fracture 12/21/2018  . Lower urinary tract infectious disease 10/03/2018  . Asthma, chronic, unspecified asthma severity, with acute exacerbation 10/03/2018  . SIRS (systemic inflammatory response syndrome) ([place]) 10/03/2018  . Nausea vomiting and diarrhea 10/02/2018  . Chronic diastolic CHF (congestive heart failure) ([place]) 10/01/2017  . Influenza B 10/01/2017  . Persistent asthma with status asthmaticus   . Pneumonia 07/15/2016  . Asthma, mild intermittent 07/15/2016  . GERD without esophagitis 07/15/2016  . Renal transplant recipient 07/15/2016  . Hyperlipidemia 07/15/2016  . Depression 07/15/2016  . Bronchitis, mucopurulent recurrent ([place]) 08/08/2014  . Chronic cough 07/24/2014  . End stage renal disease ([place]) 03/23/2012  . 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, implant, and graft 03/23/2012    Orientation RESPIRATION BLADDER Height & Weight     Self, Time, Situation, Place  O2 (Nasal cannula 2L) Continent, Indwelling catheter Weight:   Height:     BEHAVIORAL SYMPTOMS/MOOD NEUROLOGICAL BOWEL NUTRITION STATUS      Continent Diet (Please see DC Summary)  AMBULATORY STATUS COMMUNICATION OF NEEDS Skin   Extensive Assist Verbally PU Stage and Appropriate Care (Stage III on coccyx;)  Personal Care Assistance Level of Assistance  Bathing, Feeding, Dressing Bathing Assistance: Maximum assistance Feeding assistance: Independent Dressing Assistance: Maximum assistance     Functional Limitations Info  Sight, Hearing, Speech Sight  Info: Impaired (Wears glasses) Hearing Info: Adequate Speech Info: Adequate    SPECIAL CARE FACTORS FREQUENCY  PT (By licensed PT), OT (By licensed OT)     PT Frequency: 5x/week OT Frequency: 5x/week            Contractures Contractures Info: Not present    Additional Factors Info  Code Status, Allergies, Psychotropic, Isolation Precautions Code Status Info: Full Allergies Info: Infed (Iron Dextran), Pentamidine, Erythromycin (Erythromycin), Iohexol, Oxycodone, Erythromycin, Ultram (Tramadol Hcl) Psychotropic Info: Prozac   Isolation Precautions Info: OMDRO     Current Medications (05/15/2020):  This is the current hospital active medication list Current Facility-Administered Medications  Medication Dose Route Frequency Provider Last Rate Last Admin  . acetaminophen (TYLENOL) tablet 650 mg  650 mg Oral Q6H PRN Wynetta Fines T, MD      . allopurinol (ZYLOPRIM) tablet 300 mg  300 mg Oral Daily Wynetta Fines T, MD   300 mg at 05/15/20 1032  . ascorbic acid (VITAMIN C) tablet 500 mg  500 mg Oral Daily Wynetta Fines T, MD   500 mg at 05/15/20 1031  . [START ON 05/19/2020] aspirin chewable tablet 81 mg  81 mg Oral Daily Lala Lund K, MD      . brimonidine (ALPHAGAN) 0.2 % ophthalmic solution 1 drop  1 drop Both Eyes BID Wynetta Fines T, MD   1 drop at 05/15/20 1032  . ceFEPIme (MAXIPIME) 2 g in sodium chloride 0.9 % 100 mL IVPB  2 g Intravenous Q12H Fenton Callas, NP 200 mL/hr at 05/15/20 1122 2 g at 05/15/20 1122  . Chlorhexidine Gluconate Cloth 2 % PADS 6 each  6 each Topical Daily Thurnell Lose, MD   6 each at 05/15/20 1033  . cholecalciferol (VITAMIN D3) tablet 1,000 Units  1,000 Units Oral Daily Lequita Halt, MD   1,000 Units at 05/15/20 1031  . collagenase (SANTYL) ointment 1 application  1 application Topical Daily Thurnell Lose, MD   1 application at 29/52/84 1034  . cycloSPORINE (RESTASIS) 0.05 % ophthalmic emulsion 1 drop  1 drop Both Eyes BID Wynetta Fines T, MD   1  drop at 05/15/20 1033  . docusate sodium (COLACE) capsule 100 mg  100 mg Oral BID Wynetta Fines T, MD   100 mg at 05/15/20 1030  . dorzolamide (TRUSOPT) 2 % ophthalmic solution 1 drop  1 drop Both Eyes Daily Wynetta Fines T, MD   1 drop at 05/15/20 1034  . enoxaparin (LOVENOX) injection 75 mg  1 mg/kg Subcutaneous Q12H Noemi Chapel P, DO   75 mg at 05/15/20 1427  . feeding supplement (ENSURE ENLIVE) (ENSURE ENLIVE) liquid 237 mL  237 mL Oral BID BM Wynetta Fines T, MD   237 mL at 05/15/20 1428  . feeding supplement (PRO-STAT SUGAR FREE 64) liquid 30 mL  30 mL Oral BID WC Wynetta Fines T, MD   30 mL at 05/15/20 0809  . ferrous sulfate tablet 325 mg  325 mg Oral BID WC Thurnell Lose, MD   325 mg at 05/15/20 0809  . FLUoxetine (PROZAC) capsule 40 mg  40 mg Oral Daily Wynetta Fines T, MD   40 mg at 05/15/20 1031  . gabapentin (NEURONTIN) capsule 200 mg  200 mg Oral TID Lequita Halt,  MD   200 mg at 05/15/20 1529  . guaiFENesin (MUCINEX) 12 hr tablet 600 mg  600 mg Oral BID Wynetta Fines T, MD   600 mg at 05/15/20 1032  . hydrOXYzine (ATARAX/VISTARIL) tablet 10 mg  10 mg Oral BID PRN Wynetta Fines T, MD      . insulin aspart (novoLOG) injection 0-9 Units  0-9 Units Subcutaneous TID WC Lequita Halt, MD   3 Units at 05/15/20 1240  . ipratropium-albuterol (DUONEB) 0.5-2.5 (3) MG/3ML nebulizer solution 3 mL  3 mL Nebulization Q6H Thurnell Lose, MD   3 mL at 05/15/20 1408  . lactated ringers infusion   Intravenous Continuous Thurnell Lose, MD 75 mL/hr at 05/15/20 1137 New Bag at 05/15/20 1137  . latanoprost (XALATAN) 0.005 % ophthalmic solution 1 drop  1 drop Both Eyes QHS Lequita Halt, MD   1 drop at 05/14/20 2124  . levalbuterol (XOPENEX) nebulizer solution 0.63 mg  0.63 mg Nebulization Q6H PRN Wynetta Fines T, MD   0.63 mg at 05/14/20 1437  . magnesium oxide (MAG-OX) tablet 400 mg  400 mg Oral BID Wynetta Fines T, MD   400 mg at 05/15/20 1031  . methylPREDNISolone sodium succinate (SOLU-MEDROL) 40 mg/mL  injection 40 mg  40 mg Intravenous Q12H Thurnell Lose, MD      . multivitamin with minerals tablet 1 tablet  1 tablet Oral Daily Lequita Halt, MD   1 tablet at 05/15/20 1038  . ondansetron (ZOFRAN) injection 4 mg  4 mg Intravenous Q6H PRN Wynetta Fines T, MD   4 mg at 05/13/20 2035  . pantoprazole (PROTONIX) injection 40 mg  40 mg Intravenous Q12H Thurnell Lose, MD      . senna-docusate (Senokot-S) tablet 1 tablet  1 tablet Oral Daily Lequita Halt, MD   1 tablet at 05/15/20 1030  . tacrolimus (PROGRAF) capsule 0.5 mg  0.5 mg Oral QHS Wynetta Fines T, MD   0.5 mg at 05/14/20 2123  . tacrolimus (PROGRAF) capsule 1 mg  1 mg Oral Daily Wynetta Fines T, MD   1 mg at 05/15/20 1031  . valACYclovir (VALTREX) tablet 500 mg  500 mg Oral BID Wynetta Fines T, MD   500 mg at 05/15/20 1031  . verapamil (CALAN-SR) CR tablet 120 mg  120 mg Oral Daily Thurnell Lose, MD   120 mg at 05/15/20 1030  . vitamin B-12 (CYANOCOBALAMIN) tablet 1,000 mcg  1,000 mcg Oral Daily Wynetta Fines T, MD   1,000 mcg at 05/15/20 1031     Discharge Medications: Please see discharge summary for a list of discharge medications.  Relevant Imaging Results:  Relevant Lab Results:   Additional Information SSN: [id number]             Negative [place], LCSW

## 2020-05-15 NOTE — Consult Note (Signed)
Reason for Consult: Iron deficiency anemia. Referring Physician: [facility] hospitalist-Dr. Lala Lund. April [name] is an 64 y.o. female.  HPI: [name] is a 64 year old white female with multiple medical problems listed below, who was admitted to [facility] from [facility] with worsening shortness of breath and a fever without a clear source.  She has had about 4 such admissions in the recent past. She has been in nursing home since she had a fall with a left hip fracture about 6 months ago.  She denies having any melena hematochezia.  She has had some dysphagia with liquids and solids for several weeks now but this is a relatively new problem.  She has occasional reflux.  She had problems with chronic constipation and goes 3 to 4 days without a bowel movement. Her last colonoscopy done in 2010 revealed few scattered diverticula.  An EGD done at the same time revealed mild antral gastritis. She has not had any endoscopic evaluation since then.  GI evaluation has been requested to further work-up her anemia.  Past Medical History:  Diagnosis Date  . Arthritis   . Asthma   . CHF (congestive heart failure) ([place])   . COPD (chronic obstructive pulmonary disease) ([place])   . Depression   . Essential hypertension 02/08/2020  . GERD (gastroesophageal reflux disease)   . Hyperlipidemia   . Hyperparathyroidism   . Polycystic kidney   . PONV (postoperative nausea and vomiting)    Past Surgical History:  Procedure Laterality Date  . 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 BILATERAL OOPHORECTOMY    . CARPAL TUNNEL RELEASE  2000  . CESAREAN SECTION    . INSERTION OF DIALYSIS CATHETER  03/31/2012   Procedure: INSERTION OF DIALYSIS CATHETER;  Surgeon: Angelia Mould, MD;  Location: [facility];  Service: Vascular;  Laterality: N/A;  insertion of dialysis catheter right internal jugular vein  . 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 ORIF TIBIA FRACTURE Left  12/23/2018   Procedure: OPEN REDUCTION INTERNAL FIXATION (ORIF) TIBIA FRACTURE;  Surgeon: Shona Needles, MD;  Location: [facility];  Service: Orthopedics;  Laterality: Left;  . ORIF TIBIA PLATEAU Right 12/23/2018   Procedure: OPEN REDUCTION INTERNAL FIXATION (ORIF) TIBIAL PLATEAU;  Surgeon: Shona Needles, MD;  Location: [facility];  Service: Orthopedics;  Laterality: Right;  . ORIF WRIST FRACTURE Left 12/08/2019   Procedure: OPEN REDUCTION INTERNAL FIXATION (ORIF) WRIST FRACTURE, REPAIR LACERATION LEFT FOREARM;  Surgeon: Dorna Leitz, MD;  Location: [facility];  Service: Orthopedics;  Laterality: Left;  . TONSILLECTOMY  1967  . TOTAL HIP ARTHROPLASTY Left 12/08/2019   Procedure: TOTAL HIP ARTHROPLASTY ANTERIOR APPROACH;  Surgeon: Dorna Leitz, MD;  Location: [facility];  Service: Orthopedics;  Laterality: Left;  . TUBAL LIGATION  2010   Family History  Problem Relation Age of Onset  . Hypertension Mother   . Heart disease Father        CABG history  . Polycystic kidney disease Father   . Polycystic kidney disease Brother    Medications: I have reviewed the patient's current medications.  Social History:  reports that she quit smoking about 22 years ago. Her smoking use included cigarettes. She has a 3.75 pack-year smoking history. She has never used smokeless tobacco. She reports that she does not drink alcohol and does not use drugs.  Allergies:  Allergies  Allergen Reactions  . Ardyth Harps [Iron Dextran] Other (See Comments)    Chest  tightness  . Pentamidine Itching, Shortness Of Breath and Swelling  . Erythromycin [Erythromycin] Other (See Comments)    Mouth Ulcers  . Iohexol Other (See Comments)    Per patient "she has had a kidney transplant and should never have contrast"  . Oxycodone Nausea Only and Nausea And Vomiting  . Erythromycin Rash    Causes breakout in mouth  . Ultram [Tramadol Hcl] Anxiety   Results for orders placed or performed during the hospital encounter of 05/13/20 (from the past  48 hour(s))  Lactic acid, plasma     Status: None   Collection Time: 05/13/20 11:04 AM  Result Value Ref Range   Lactic Acid, Venous 0.9 0.5 - 1.9 mmol/L    Comment: Performed at [facility], [address], [place], [place] [address]  I-Stat arterial blood gas, ED     Status: Abnormal   Collection Time: 05/13/20 11:29 AM  Result Value Ref Range   pH, Arterial 7.341 (L) 7.35 - 7.45   pCO2 arterial 54.5 (H) 32 - 48 mmHg   pO2, Arterial 154 (H) 83 - 108 mmHg   Bicarbonate 28.9 (H) 20.0 - 28.0 mmol/L   TCO2 30 22 - 32 mmol/L   O2 Saturation 99.0 %   Acid-Base Excess 3.0 (H) 0.0 - 2.0 mmol/L   Sodium 139 135 - 145 mmol/L   Potassium 4.1 3.5 - 5.1 mmol/L   Calcium, Ion 1.27 1.15 - 1.40 mmol/L   HCT 37.0 36 - 46 %   Hemoglobin 12.6 12.0 - 15.0 g/dL   Patient temperature 102.4 F    Collection site Radial    Drawn by RT    Sample type ARTERIAL   APTT     Status: None   Collection Time: 05/13/20 11:48 AM  Result Value Ref Range   aPTT 36 24 - 36 seconds    Comment: Performed at [facility] [address], [place], [place] [address]  Protime-INR     Status: None   Collection Time: 05/13/20 11:48 AM  Result Value Ref Range   Prothrombin Time 15.0 11.4 - 15.2 seconds   INR 1.2 0.8 - 1.2    Comment: (NOTE) INR goal varies based on device and disease states. Performed at [facility], [place] [address], [place], [place] [address]   Blood Culture (routine x 2)     Status: None (Preliminary result)   Collection Time: 05/13/20 11:48 AM   Specimen: BLOOD  Result Value Ref Range   Specimen Description BLOOD RIGHT ANTECUBITAL    Special Requests      BOTTLES DRAWN AEROBIC AND ANAEROBIC Blood Culture adequate volume   Culture      NO GROWTH 2 DAYS Performed at [facility], [place] [address], [place], [place] [address]    Report Status PENDING   SARS Coronavirus 2 by RT PCR (hospital order, performed in [facility] hospital lab) Nasopharyngeal Nasopharyngeal Swab      Status: None   Collection Time: 05/13/20  1:53 PM   Specimen: Nasopharyngeal Swab  Result Value Ref Range   SARS Coronavirus 2 NEGATIVE NEGATIVE    Comment: (NOTE) SARS-CoV-2 target nucleic acids are NOT DETECTED.  The SARS-CoV-2 RNA is generally detectable in upper and lower respiratory specimens during the acute phase of infection. The lowest concentration of SARS-CoV-2 viral copies this assay can detect is 250 copies / mL. A negative result does not preclude SARS-CoV-2 infection and should not be used as the  sole basis for treatment or other patient management decisions.  A negative result may occur with improper specimen collection / handling, submission of specimen other than nasopharyngeal swab, presence of viral mutation(s) within the areas targeted by this assay, and inadequate number of viral copies (<250 copies / mL). A negative result must be combined with clinical observations, patient history, and epidemiological information.  Fact Sheet for Patients:   StrictlyIdeas.no  Fact Sheet for Healthcare Providers: BankingDealers.co.za  This test is not yet approved or  cleared by the [place] FDA and has been authorized for detection and/or diagnosis of SARS-CoV-2 by FDA under an Emergency Use Authorization (EUA).  This EUA will remain in effect (meaning this test can be used) for the duration of the COVID-19 declaration under Section 564(b)(1) of the Act, 21 U.S.C. section 360bbb-3(b)(1), unless the authorization is terminated or revoked sooner.  Performed at [facility], [place] [address] [place], [place] [address]   Urinalysis, Routine w reflex microscopic     Status: Abnormal   Collection Time: 05/13/20  3:12 PM  Result Value Ref Range   Color, Urine AMBER (A) YELLOW    Comment: BIOCHEMICALS MAY BE AFFECTED BY COLOR   APPearance HAZY (A) CLEAR   Specific Gravity, Urine 1.018 1.005 - 1.030   pH 5.0 5.0 -  8.0   Glucose, UA NEGATIVE NEGATIVE mg/dL   Hgb urine dipstick SMALL (A) NEGATIVE   Bilirubin Urine NEGATIVE NEGATIVE   Ketones, ur NEGATIVE NEGATIVE mg/dL   Protein, ur 100 (A) NEGATIVE mg/dL   Nitrite NEGATIVE NEGATIVE   Leukocytes,Ua NEGATIVE NEGATIVE   RBC / HPF 0-5 0 - 5 RBC/hpf   WBC, UA 0-5 0 - 5 WBC/hpf   Bacteria, UA NONE SEEN NONE SEEN   Squamous Epithelial / LPF 0-5 0 - 5   Mucus PRESENT    Amorphous Crystal PRESENT     Comment: Performed at [facility], [name] [address], [place], [place] [address]  Urine culture     Status: None   Collection Time: 05/13/20  3:12 PM   Specimen: In/Out Cath Urine  Result Value Ref Range   Specimen Description IN/OUT CATH URINE    Special Requests NONE    Culture      NO GROWTH Performed at [facility], [place] [address] [place], [place] [address]    Report Status 05/14/2020 FINAL   Troponin I (High Sensitivity)     Status: None   Collection Time: 05/13/20  4:03 PM  Result Value Ref Range   Troponin I (High Sensitivity) 11 <18 ng/L    Comment: (NOTE) Elevated high sensitivity troponin I (hsTnI) values and significant  changes across serial measurements may suggest ACS but many other  chronic and acute conditions are known to elevate hsTnI results.  Refer to the "Links" section for chest pain algorithms and additional  guidance. Performed at [facility], [place] [address], [place], [place] [address]   Procalcitonin - Baseline     Status: None   Collection Time: 05/13/20  4:03 PM  Result Value Ref Range   Procalcitonin 3.84 ng/mL    Comment:        Interpretation: PCT > 2 ng/mL: Systemic infection (sepsis) is likely, unless other causes are known. (NOTE)       Sepsis PCT Algorithm           Lower Respiratory Tract  Infection PCT Algorithm    ----------------------------     ----------------------------         PCT < 0.25 ng/mL                PCT < 0.10 ng/mL           Strongly encourage             Strongly discourage   discontinuation of antibiotics    initiation of antibiotics    ----------------------------     -----------------------------       PCT 0.25 - 0.50 ng/mL            PCT 0.10 - 0.25 ng/mL               OR       >80% decrease in PCT            Discourage initiation of                                            antibiotics      Encourage discontinuation           of antibiotics    ----------------------------     -----------------------------         PCT >= 0.50 ng/mL              PCT 0.26 - 0.50 ng/mL               AND       <80% decrease in PCT              Encourage initiation of                                             antibiotics       Encourage continuation           of antibiotics    ----------------------------     -----------------------------        PCT >= 0.50 ng/mL                  PCT > 0.50 ng/mL               AND         increase in PCT                  Strongly encourage                                      initiation of antibiotics    Strongly encourage escalation           of antibiotics                                     -----------------------------                                           PCT <= 0.25 ng/mL  OR                                        > 80% decrease in PCT                                      Discontinue / Do not initiate                                             antibiotics  Performed at [facility], [place] [address] [place], [place] [address]   Brain natriuretic peptide     Status: Abnormal   Collection Time: 05/13/20  4:03 PM  Result Value Ref Range   B Natriuretic Peptide 1,176.0 (H) 0.0 - 100.0 pg/mL    Comment: Performed at [facility] [address] [place], [place] [address]  CBG monitoring, ED     Status: None   Collection Time: 05/13/20  5:15 PM  Result Value Ref Range   Glucose-Capillary 79 70 - 99 mg/dL     Comment: Glucose reference range applies only to samples taken after fasting for at least 8 hours.  CBG monitoring, ED     Status: None   Collection Time: 05/13/20  9:12 PM  Result Value Ref Range   Glucose-Capillary 74 70 - 99 mg/dL    Comment: Glucose reference range applies only to samples taken after fasting for at least 8 hours.  Basic metabolic panel     Status: Abnormal   Collection Time: 05/14/20  2:43 AM  Result Value Ref Range   Sodium 136 135 - 145 mmol/L   Potassium 3.8 3.5 - 5.1 mmol/L   Chloride 102 98 - 111 mmol/L   CO2 24 22 - 32 mmol/L   Glucose, Bld 94 70 - 99 mg/dL    Comment: Glucose reference range applies only to samples taken after fasting for at least 8 hours.   BUN 30 (H) 8 - 23 mg/dL   Creatinine, Ser 1.06 (H) 0.44 - 1.00 mg/dL   Calcium 9.3 8.9 - 10.3 mg/dL   GFR calc non Af Amer 56 (L) >60 mL/min   GFR calc Af Amer >60 >60 mL/min   Anion gap 10 5 - 15    Comment: Performed at [facility] [address], [place], [place] [address]  CBC     Status: Abnormal   Collection Time: 05/14/20  2:43 AM  Result Value Ref Range   WBC 18.7 (H) 4.0 - 10.5 K/uL   RBC 2.80 (L) 3.87 - 5.11 MIL/uL   Hemoglobin 8.5 (L) 12.0 - 15.0 g/dL   HCT 28.8 (L) 36 - 46 %   MCV 102.9 (H) 80.0 - 100.0 fL   MCH 30.4 26.0 - 34.0 pg   MCHC 29.5 (L) 30.0 - 36.0 g/dL   RDW 23.3 (H) 11.5 - 15.5 %   Platelets 205 150 - 400 K/uL   nRBC 0.0 0.0 - 0.2 %    Comment: Performed at [facility], [place] [address] [place], [place] [address]  Procalcitonin     Status: None   Collection Time: 05/14/20  2:43 AM  Result Value Ref Range  Procalcitonin 6.01 ng/mL    Comment:        Interpretation: PCT > 2 ng/mL: Systemic infection (sepsis) is likely, unless other causes are known. (NOTE)       Sepsis PCT Algorithm           Lower Respiratory Tract                                      Infection PCT Algorithm    ----------------------------     ----------------------------          PCT < 0.25 ng/mL                PCT < 0.10 ng/mL          Strongly encourage             Strongly discourage   discontinuation of antibiotics    initiation of antibiotics    ----------------------------     -----------------------------       PCT 0.25 - 0.50 ng/mL            PCT 0.10 - 0.25 ng/mL               OR       >80% decrease in PCT            Discourage initiation of                                            antibiotics      Encourage discontinuation           of antibiotics    ----------------------------     -----------------------------         PCT >= 0.50 ng/mL              PCT 0.26 - 0.50 ng/mL               AND       <80% decrease in PCT              Encourage initiation of                                             antibiotics       Encourage continuation           of antibiotics    ----------------------------     -----------------------------        PCT >= 0.50 ng/mL                  PCT > 0.50 ng/mL               AND         increase in PCT                  Strongly encourage                                      initiation of antibiotics    Strongly encourage escalation           of antibiotics                                     -----------------------------  PCT <= 0.25 ng/mL                                                 OR                                        > 80% decrease in PCT                                      Discontinue / Do not initiate                                             antibiotics  Performed at [facility], [place] [address], [place], [place] [address]   MRSA PCR Screening     Status: None   Collection Time: 05/14/20  4:46 AM   Specimen: Nasal Mucosa; Nasopharyngeal  Result Value Ref Range   MRSA by PCR NEGATIVE NEGATIVE    Comment:        The GeneXpert MRSA Assay (FDA approved for NASAL specimens only), is one component of a comprehensive MRSA colonization surveillance program.  It is not intended to diagnose MRSA infection nor to guide or monitor treatment for MRSA infections. Performed at [facility], [place] [address] [place], [place] [address]   Lactic acid, plasma     Status: None   Collection Time: 05/14/20  8:27 AM  Result Value Ref Range   Lactic Acid, Venous 1.1 0.5 - 1.9 mmol/L    Comment: Performed at [facility] [address] [place], [place] [address]  Brain natriuretic peptide     Status: Abnormal   Collection Time: 05/14/20  8:27 AM  Result Value Ref Range   B Natriuretic Peptide 468.5 (H) 0.0 - 100.0 pg/mL    Comment: Performed at [facility] [address], [place], [place] [address]  Magnesium     Status: Abnormal   Collection Time: 05/14/20  8:27 AM  Result Value Ref Range   Magnesium 1.5 (L) 1.7 - 2.4 mg/dL    Comment: Performed at [facility] [address] [place], [place] [address]  D-dimer, quantitative (not at [facility])     Status: Abnormal   Collection Time: 05/14/20  8:27 AM  Result Value Ref Range   D-Dimer, Quant 2.75 (H) 0.00 - 0.50 ug/mL-FEU    Comment: (NOTE) At the manufacturer cut-off of 0.50 ug/mL FEU, this assay has been documented to exclude PE with a sensitivity and negative predictive value of 97 to 99%.  At this time, this assay has not been approved by the FDA to exclude DVT/VTE. Results should be correlated with clinical presentation. Performed at [facility], [place] [address] [place], [place] [address]   C-reactive protein     Status: Abnormal   Collection Time: 05/14/20  8:27 AM  Result Value Ref Range   CRP 27.0 (H) <1.0 mg/dL    Comment: Performed at [facility] [address], [place], [place] [address]  Vitamin B12  Status: None   Collection Time: 05/14/20  8:27 AM  Result Value Ref Range   Vitamin B-12 899 180 - 914 pg/mL    Comment: (NOTE) This assay is not validated for testing neonatal or myeloproliferative syndrome specimens for Vitamin B12  levels. Performed at [facility], [place] [address], [place], [place] [address]   Folate     Status: None   Collection Time: 05/14/20  8:27 AM  Result Value Ref Range   Folate 25.0 >5.9 ng/mL    Comment: Performed at [facility] [address] [place], [place] [address]  Iron and TIBC     Status: Abnormal   Collection Time: 05/14/20  8:27 AM  Result Value Ref Range   Iron 9 (L) 28 - 170 ug/dL   TIBC 169 (L) 250 - 450 ug/dL   Saturation Ratios 5 (L) 10.4 - 31.8 %   UIBC 160 ug/dL    Comment: Performed at [facility], [place] [address], [place], [place] [address]  Ferritin     Status: None   Collection Time: 05/14/20  8:27 AM  Result Value Ref Range   Ferritin 275 11 - 307 ng/mL    Comment: Performed at [facility], [place] [address], [place], [place] [address]  Reticulocytes     Status: Abnormal   Collection Time: 05/14/20  8:27 AM  Result Value Ref Range   Retic Ct Pct 1.9 0.4 - 3.1 %   RBC. 2.61 (L) 3.87 - 5.11 MIL/uL   Retic Count, Absolute 49.1 19.0 - 186.0 K/uL   Immature Retic Fract 14.0 2.3 - 15.9 %    Comment: Performed at [facility] [address] [place], [place] [address]  Type and screen     Status: None (Preliminary result)   Collection Time: 05/14/20  8:28 AM  Result Value Ref Range   ABO/RH(D) A NEG    Antibody Screen NEG    Sample Expiration 05/17/2020,2359    Unit Number [id number]    Blood Component Type RED CELLS,LR    Unit division 00    Status of Unit ISSUED    Transfusion Status OK TO TRANSFUSE    Crossmatch Result      Compatible Performed at [facility], [place] [address] [place], [place] [address]   Glucose, capillary     Status: Abnormal   Collection Time: 05/14/20  8:29 AM  Result Value Ref Range   Glucose-Capillary 121 (H) 70 - 99 mg/dL    Comment: Glucose reference range applies only to samples taken after fasting for at least 8 hours.  Glucose, capillary     Status: Abnormal   Collection Time:  05/14/20 11:49 AM  Result Value Ref Range   Glucose-Capillary 216 (H) 70 - 99 mg/dL    Comment: Glucose reference range applies only to samples taken after fasting for at least 8 hours.  Glucose, capillary     Status: Abnormal   Collection Time: 05/14/20  5:52 PM  Result Value Ref Range   Glucose-Capillary 369 (H) 70 - 99 mg/dL    Comment: Glucose reference range applies only to samples taken after fasting for at least 8 hours.  Glucose, capillary     Status: Abnormal   Collection Time: 05/14/20  9:22 PM  Result Value Ref Range   Glucose-Capillary 299 (H) 70 - 99 mg/dL    Comment: Glucose reference range applies only to samples taken after fasting for at least  8 hours.  Magnesium     Status: None   Collection Time: 05/15/20  3:48 AM  Result Value Ref Range   Magnesium 2.0 1.7 - 2.4 mg/dL    Comment: Performed at [facility] [address] [place], [place] [address]  C-reactive protein     Status: Abnormal   Collection Time: 05/15/20  3:48 AM  Result Value Ref Range   CRP 17.7 (H) <1.0 mg/dL    Comment: Performed at [facility] [address] [place], [place] [address]  Comprehensive metabolic panel     Status: Abnormal   Collection Time: 05/15/20  3:48 AM  Result Value Ref Range   Sodium 128 (L) 135 - 145 mmol/L   Potassium 3.4 (L) 3.5 - 5.1 mmol/L   Chloride 95 (L) 98 - 111 mmol/L   CO2 19 (L) 22 - 32 mmol/L   Glucose, Bld 216 (H) 70 - 99 mg/dL    Comment: Glucose reference range applies only to samples taken after fasting for at least 8 hours.   BUN 45 (H) 8 - 23 mg/dL   Creatinine, Ser 0.93 0.44 - 1.00 mg/dL   Calcium 7.7 (L) 8.9 - 10.3 mg/dL   Total Protein 4.9 (L) 6.5 - 8.1 g/dL   Albumin 1.8 (L) 3.5 - 5.0 g/dL   AST 14 (L) 15 - 41 U/L   ALT 14 0 - 44 U/L   Alkaline Phosphatase 54 38 - 126 U/L   Total Bilirubin 3.9 (H) 0.3 - 1.2 mg/dL   GFR calc non Af Amer >60 >60 mL/min   GFR calc Af Amer >60 >60 mL/min   Anion gap 14 5 - 15    Comment: Performed  at [facility] [address], [place], [place] [address]  CBC with Differential/Platelet     Status: Abnormal   Collection Time: 05/15/20  3:48 AM  Result Value Ref Range   WBC 8.3 4.0 - 10.5 K/uL   RBC 2.16 (L) 3.87 - 5.11 MIL/uL   Hemoglobin 6.7 (LL) 12.0 - 15.0 g/dL    Comment: REPEATED TO VERIFY THIS CRITICAL RESULT HAS VERIFIED AND BEEN CALLED TO M.OVERBY,RN BY MELISSA BROGDON ON 06 23 2021 AT 0437, AND HAS BEEN READ BACK.     HCT 22.5 (L) 36 - 46 %   MCV 104.2 (H) 80.0 - 100.0 fL   MCH 31.0 26.0 - 34.0 pg   MCHC 29.8 (L) 30.0 - 36.0 g/dL   RDW 22.5 (H) 11.5 - 15.5 %   Platelets 149 (L) 150 - 400 K/uL   nRBC 0.0 0.0 - 0.2 %   Neutrophils Relative % 95 %   Neutro Abs 7.8 (H) 1.7 - 7.7 K/uL   Lymphocytes Relative 3 %   Lymphs Abs 0.3 (L) 0.7 - 4.0 K/uL   Monocytes Relative 1 %   Monocytes Absolute 0.1 0 - 1 K/uL   Eosinophils Relative 0 %   Eosinophils Absolute 0.0 0 - 0 K/uL   Basophils Relative 0 %   Basophils Absolute 0.0 0 - 0 K/uL   Immature Granulocytes 1 %   Abs Immature Granulocytes 0.06 0.00 - 0.07 K/uL   Tear Drop Cells PRESENT    Polychromasia PRESENT     Comment: Performed at [facility], [place] [address], [place], [place] [address]  Brain natriuretic peptide     Status: Abnormal   Collection Time: 05/15/20  3:48 AM  Result Value Ref Range  B Natriuretic Peptide 534.1 (H) 0.0 - 100.0 pg/mL    Comment: Performed at [facility] [address] [place], [place] [address]  D-dimer, quantitative (not at [facility])     Status: Abnormal   Collection Time: 05/15/20  3:48 AM  Result Value Ref Range   D-Dimer, Quant 1.47 (H) 0.00 - 0.50 ug/mL-FEU    Comment: (NOTE) At the manufacturer cut-off of 0.50 ug/mL FEU, this assay has been documented to exclude PE with a sensitivity and negative predictive value of 97 to 99%.  At this time, this assay has not been approved by the FDA to exclude DVT/VTE. Results should be correlated with clinical  presentation. Performed at [facility], [place] [address], [place], [place] [address]   Procalcitonin     Status: None   Collection Time: 05/15/20  3:48 AM  Result Value Ref Range   Procalcitonin 3.96 ng/mL    Comment:        Interpretation: PCT > 2 ng/mL: Systemic infection (sepsis) is likely, unless other causes are known. (NOTE)       Sepsis PCT Algorithm           Lower Respiratory Tract                                      Infection PCT Algorithm    ----------------------------     ----------------------------         PCT < 0.25 ng/mL                PCT < 0.10 ng/mL          Strongly encourage             Strongly discourage   discontinuation of antibiotics    initiation of antibiotics    ----------------------------     -----------------------------       PCT 0.25 - 0.50 ng/mL            PCT 0.10 - 0.25 ng/mL               OR       >80% decrease in PCT            Discourage initiation of                                            antibiotics      Encourage discontinuation           of antibiotics    ----------------------------     -----------------------------         PCT >= 0.50 ng/mL              PCT 0.26 - 0.50 ng/mL               AND       <80% decrease in PCT              Encourage initiation of                                             antibiotics       Encourage continuation  of antibiotics    ----------------------------     -----------------------------        PCT >= 0.50 ng/mL                  PCT > 0.50 ng/mL               AND         increase in PCT                  Strongly encourage                                      initiation of antibiotics    Strongly encourage escalation           of antibiotics                                     -----------------------------                                           PCT <= 0.25 ng/mL                                                 OR                                        > 80% decrease in  PCT                                      Discontinue / Do not initiate                                             antibiotics  Performed at [facility], [address] [place], [place] [address]   Prepare RBC (crossmatch)     Status: None   Collection Time: 05/15/20  4:50 AM  Result Value Ref Range   Order Confirmation      ORDER PROCESSED BY BLOOD BANK Performed at [facility], [place] [address] [place], [place] [address]   Glucose, capillary     Status: Abnormal   Collection Time: 05/15/20  7:33 AM  Result Value Ref Range   Glucose-Capillary 208 (H) 70 - 99 mg/dL    Comment: Glucose reference range applies only to samples taken after fasting for at least 8 hours.  Osmolality, urine     Status: None   Collection Time: 05/15/20  7:38 AM  Result Value Ref Range   Osmolality, Ur 440 300 - 900 mOsm/kg    Comment: Performed at [facility] [address], [place], [place] [address]  Sodium, urine, random     Status: None   Collection Time: 05/15/20  7:38 AM  Result Value Ref Range   Sodium,  Ur 21 mmol/L    Comment: Performed at [facility], [place] [address] [place], [place] [address]  Urinalysis, Routine w reflex microscopic     Status: None   Collection Time: 05/15/20  7:38 AM  Result Value Ref Range   Color, Urine YELLOW YELLOW   APPearance CLEAR CLEAR   Specific Gravity, Urine 1.029 1.005 - 1.030   pH 5.0 5.0 - 8.0   Glucose, UA NEGATIVE NEGATIVE mg/dL   Hgb urine dipstick NEGATIVE NEGATIVE   Bilirubin Urine NEGATIVE NEGATIVE   Ketones, ur NEGATIVE NEGATIVE mg/dL   Protein, ur NEGATIVE NEGATIVE mg/dL   Nitrite NEGATIVE NEGATIVE   Leukocytes,Ua NEGATIVE NEGATIVE    Comment: Performed at [facility] [address] [place], [place] [address]   ROS:  As stated above in the HPI otherwise negative.  Blood pressure 111/65, pulse 92, temperature 97.8 F (36.6 C), temperature source Oral, resp. rate (!) 21, SpO2 100 %.  PE: Morbidly obese  white female with multiple bruises on her forearms Gen: NAD, Alert and Oriented, cooperative and answering questions appropriately HEENT:  [place]/AT, EOMI Neck: Supple, no LAD Lungs: CTA Bilaterally CV: RRR without M/G/R ABM: Soft, morbidly obese, NTND, +BS Ext: No C/C/E  Assessment/Plan: 1) Sepsis with leukocytosis elevated inflammatory markers including CRP and procalcitonin in a 64 year old white female status post renal transplant on chronic immunosuppressive  Therapy-with iron deficiency anemia hemoglobin of 6.7 iron of 9 and elevated MCV of 102 but with a normal B12 and folate levels. Patient will need an EGD and a colonoscopy once her acute pulmonary status improves.  2) Chronic constipation-will benefit from Colace 2 in the AM and 2 in p.m.[name] 3) History of pulmonary hypertension. 4) Chronic diastolic dysfunction-EF of 55 percent. 5) Hyponatremia/hypokalemia. 6) Small chronic sacrococcygeal decubitus ulcer. Juanita Craver 05/15/2020, 10:58 AM

## 2020-05-15 NOTE — Progress Notes (Signed)
PROGRESS NOTE                                                                                                                                                                                                             Patient Demographics:    April [name], is a 64 y.o. female, DOB - 1956-03-04, ZHG:[id number]  Admit date - 05/13/2020   Admitting Physician Lequita Halt, MD  Outpatient Primary MD for the patient is [name], MD  LOS - 2  Chief Complaint  Patient presents with  . Shortness of Breath  . Chest Pain       Brief Narrative - April [name] is a 64 y.o. female nursing home resident at [facility] with medical history significant of polycystic kidney disease s/p renal transplant (on prednisone, mycophenalate, prograf), COPD on 2L [place] baseline, diastolic CHF, chronic hypoxic resp failure, obesity, HTN, pulmonary HTN, recurrent UTI, presented to ED withSOB wheezingand fever with no clear source, she has recently been admitted 3-4 times in the last few weeks for similar problems.  She states that about 6 months ago she had a mechanical fall with left-sided hip fracture and since then she has been able to go home and has been in a nursing home.  Her main complaint was gradually progressive shortness of breath, fatigue and low-grade fevers.   Subjective:   Patient in bed, appears comfortable, denies any headache, no fever, no chest pain or pressure, no shortness of breath , no abdominal pain. No focal weakness. Feels better.   Assessment  & Plan :     1.  Sepsis with leukocytosis, elevated procalcitonin and CRP.  In a patient with kidney transplant, on chronic immunosuppressive therapy with no clear-cut source.  At this time she has had multiple similar admissions in the recent past and no convincing source has been found, blood cultures are pending, she is currently on empiric IV vancomycin and cefepime with rapid improvement, antibiotics will be continued.   Recent echo from few weeks ago reviewed without any hint of vegetation, CT chest abdomen pelvis remains unrevealing, unremarkable V/Q and leg venous ultrasound, also ordered CMV titers.  Sepsis pathophysiology has resolved, continue supportive care with gentle hydration with IV fluids.  She does have a sacrococcygeal unstageable decubitus ulcer present for several weeks, since there is no clear source of infection will do MRI of that area to rule out osteomyelitis or deep-seated abscess.  Also requested ID to evaluate the patient.  Recent Labs  Lab 05/13/20 1044 05/13/20 1104 05/13/20 1603 05/14/20 0243 05/14/20 0827 05/15/20 0348  WBC 18.8*  --   --  18.7*  --  8.3  PLT 236  --   --  205  --  149*  CRP  --   --   --   --  27.0* 17.7*  DDIMER  --   --   --   --  2.75* 1.47*  PROCALCITON  --   --  3.84 6.01  --  3.96  LATICACIDVEN  --  0.9  --   --  1.1  --     2.  History of renal transplant in 2013 due to underlying polycystic kidney disease.  Case discussed with nephrologist Dr. Vanetta Mulders on 05/14/2020, okay for IV contrast with hydration, IV fluids continued, discontinue Myfortic as suggested by nephrology, continue other home medications with higher dose IV steroids, gradually taper.  3.  History of VT.  Dose of verapamil has been reduced due to soft blood pressure.  4.  History of pulmonary hypertension.  Supportive care.  5.  Chronic grade 2 diastolic dysfunction with EF 55%.  Currently compensated clinically, BNP elevated likely due to pulmonary hypertension.  Monitor.  She is clinically dehydrated.  6.  Chronic iron deficiency anemia.  Oral iron supplementation, no source of ongoing active bleeding however H&H dropped further on 05/15/2020 due to heme dilution, 1 unit packed RBC on 05/15/2020.  Since she has had a recurrent drop in H&H even previous admissions I have requested GI to consider doing EGD and colonoscopy to find out eventual source.  7.  Trace leg edema due to  malnutrition.  Protein supplements.  8.  Borderline elevated D-dimer.  Due to inflammation from #1 above.  Hydrate, leg ultrasound unremarkable.  9.  Hyponatremia.  Due to dehydration.  Hydrate and monitor.   10.  Hypokalemia.  Replaced.   11. Small unstageable sacrococcygeal decubitus ulcer which is chronic.  Wound care following.  Appears noninfected.  See wound care note for details.  MRI being done to rule out any deep-seated infection.  Pressure Injury 04/29/20 Coccyx Mid Stage 3 -  Full thickness tissue loss. Subcutaneous fat may be visible but bone, tendon or muscle are NOT exposed. (Active)  04/29/20 2130  Location: Coccyx  Location Orientation: Mid  Staging: Stage 3 -  Full thickness tissue loss. Subcutaneous fat may be visible but bone, tendon or muscle are NOT exposed.  Wound Description (Comments):   Present on Admission: Yes      Condition - Extremely Guarded  Family Communication  :   Left for husband Edison Nasuti [phone] on 05/14/2020 at 13:14 PM  Daughter [name] [phone] on 02/10/2020 at 11:36 AM.   Code Status :  Full  Consults  :  PCCM, Renal Dr. Moshe Cipro over the phone, ID, GI  Procedures  :    MRI - pelvis-  VQ - NML  CT chest abdomen pelvis - 1. No acute intra-abdominal or pelvic pathology. 2. Polycystic kidney disease with a right lower quadrant renal transplant. No hydronephrosis or nephrolithiasis. 3. No bowel obstruction. Normal appendix. 4. Small bilateral pleural effusions with associated bibasilar atelectasis. Please see report for chest CT. 5. Aortic Atherosclerosis (ICD10-I70.0).  No acute findings in the chest except changes suggestive of pulmonary hypertension.  Mild bilateral pleural effusions with compressive atelectasis.   Bilateral leg venous ultrasound.  No DVT.     TTE. 04/17/20 -  1. Left ventricular ejection fraction, by estimation, is 55  to 60%. The left ventricle has normal function. The left ventricle has no regional wall  motion abnormalities. Left ventricular diastolic parameters are consistent with Grade II diastolic dysfunction (pseudonormalization). Elevated left atrial pressure.  2. Right ventricular systolic function is normal. The right ventricular size is normal. There is mildly elevated pulmonary artery systolic pressure. The estimated right ventricular systolic pressure is 85.2 mmHg.  3. Left atrial size was mildly dilated.  4. The mitral valve is degenerative. Trivial mitral valve regurgitation. No evidence of mitral stenosis.  5. The aortic valve is tricuspid. Aortic valve regurgitation is not visualized. No aortic stenosis is present.  6. The inferior vena cava is dilated in size with <50% respiratory variability, suggesting right atrial pressure of 15 mmHg. Comparison(s): No significant change from prior study.   PUD Prophylaxis : PPI  Disposition Plan  :    Status is: Inpatient  Remains inpatient appropriate because:IV treatments appropriate due to intensity of illness or inability to take PO   Dispo: The patient is from: SNF              Anticipated d/c is to: SNF              Anticipated d/c date is: > 3 days              Patient currently is not medically stable to d/c.   DVT Prophylaxis  :  Lovenox    Lab Results  Component Value Date   PLT 149 (L) 05/15/2020    Diet :  Diet Order            Diet Heart Room service appropriate? No; Fluid consistency: Thin  Diet effective now                  Inpatient Medications Scheduled Meds: . allopurinol  300 mg Oral Daily  . ascorbic acid  500 mg Oral Daily  . [START ON 05/19/2020] aspirin  81 mg Oral Daily  . brimonidine  1 drop Both Eyes BID  . Chlorhexidine Gluconate Cloth  6 each Topical Daily  . cholecalciferol  1,000 Units Oral Daily  . collagenase  1 application Topical Daily  . cycloSPORINE  1 drop Both Eyes BID  . docusate sodium  100 mg Oral BID  . dorzolamide  1 drop Both Eyes Daily  . enoxaparin (LOVENOX) injection  1  mg/kg Subcutaneous Q12H  . feeding supplement (ENSURE ENLIVE)  237 mL Oral BID BM  . feeding supplement (PRO-STAT SUGAR FREE 64)  30 mL Oral BID WC  . ferrous sulfate  325 mg Oral BID WC  . FLUoxetine  40 mg Oral Daily  . gabapentin  200 mg Oral TID  . guaiFENesin  600 mg Oral BID  . insulin aspart  0-9 Units Subcutaneous TID WC  . ipratropium-albuterol  3 mL Nebulization Q6H  . latanoprost  1 drop Both Eyes QHS  . magnesium oxide  400 mg Oral BID  . methylPREDNISolone (SOLU-MEDROL) injection  40 mg Intravenous Q12H  . multivitamin with minerals  1 tablet Oral Daily  . pantoprazole  40 mg Oral BID  . senna-docusate  1 tablet Oral Daily  . tacrolimus  0.5 mg Oral QHS  . tacrolimus  1 mg Oral Daily  . valACYclovir  500 mg Oral BID  . verapamil  120 mg Oral Daily  . vitamin B-12  1,000 mcg Oral Daily   Continuous Infusions: . ceFEPime (MAXIPIME) IV 2 g (05/15/20 1122)  . lactated  ringers    . vancomycin 750 mg (05/15/20 0230)   PRN Meds:.acetaminophen **OR** [DISCONTINUED] acetaminophen, hydrOXYzine, levalbuterol, [DISCONTINUED] ondansetron **OR** ondansetron (ZOFRAN) IV  Antibiotics  :   Anti-infectives (From admission, onward)   Start     Dose/Rate Route Frequency Ordered Stop   05/14/20 0200  vancomycin (VANCOREADY) IVPB 750 mg/150 mL     Discontinue     750 mg 150 mL/hr over 60 Minutes Intravenous Every 12 hours 05/13/20 1239     05/14/20 0000  ceFEPIme (MAXIPIME) 2 g in sodium chloride 0.9 % 100 mL IVPB     Discontinue     2 g 200 mL/hr over 30 Minutes Intravenous Every 12 hours 05/13/20 1239     05/13/20 1500  valACYclovir (VALTREX) tablet 500 mg     Discontinue     500 mg Oral 2 times daily 05/13/20 1420     05/13/20 1115  ceFEPIme (MAXIPIME) 2 g in sodium chloride 0.9 % 100 mL IVPB        2 g 200 mL/hr over 30 Minutes Intravenous  Once 05/13/20 1105 05/13/20 1310   05/13/20 1115  metroNIDAZOLE (FLAGYL) IVPB 500 mg        500 mg 100 mL/hr over 60 Minutes Intravenous   Once 05/13/20 1105 05/13/20 1311   05/13/20 1115  vancomycin (VANCOCIN) IVPB 1000 mg/200 mL premix  Status:  Discontinued        1,000 mg 200 mL/hr over 60 Minutes Intravenous  Once 05/13/20 1105 05/13/20 1107   05/13/20 1115  vancomycin (VANCOREADY) IVPB 1500 mg/300 mL        1,500 mg 150 mL/hr over 120 Minutes Intravenous  Once 05/13/20 1107 05/13/20 1612          Objective:   Vitals:   05/15/20 0615 05/15/20 0821 05/15/20 0902 05/15/20 1025  BP: 106/63 112/69  111/65  Pulse:  81  92  Resp:  17  (!) 21  Temp: 97.7 F (36.5 C) 98.1 F (36.7 C)  97.8 F (36.6 C)  TempSrc: Oral Oral  Oral  SpO2:  100% 100% 100%    SpO2: 100 % O2 Flow Rate (L/min): 2 L/min (on 2L upon my arrival, I did not change) FiO2 (%): 40 %  Wt Readings from Last 3 Encounters:  05/02/20 74.4 kg  04/19/20 73.2 kg  04/08/20 74.2 kg     Intake/Output Summary (Last 24 hours) at 05/15/2020 1128 Last data filed at 05/15/2020 1122 Gross per 24 hour  Intake 2025 ml  Output 1550 ml  Net 475 ml     Physical Exam  Awake Alert, No new F.N deficits, Normal affect [place].AT,PERRAL Supple Neck,No JVD, No cervical lymphadenopathy appriciated.  Symmetrical Chest wall movement, Good air movement bilaterally, CTAB RRR,No Gallops, Rubs or new Murmurs, No Parasternal Heave +ve B.Sounds, Abd Soft, No tenderness, No organomegaly appriciated, No rebound - guarding or rigidity. No Cyanosis, Clubbing or edema, No new Rash or bruise     Pressure Injury 04/29/20 Coccyx Mid Stage 3 -  Full thickness tissue loss. Subcutaneous fat may be visible but bone, tendon or muscle are NOT exposed. (Active)  04/29/20 2130  Location: Coccyx  Location Orientation: Mid  Staging: Stage 3 -  Full thickness tissue loss. Subcutaneous fat may be visible but bone, tendon or muscle are NOT exposed.  Wound Description (Comments):   Present on Admission: Yes     Data Review:    Recent Labs  Lab 05/13/20 1044 05/13/20 1129  05/14/20 0243 05/15/20  0348  WBC 18.8*  --  18.7* 8.3  HGB 9.5* 12.6 8.5* 6.7*  HCT 32.4* 37.0 28.8* 22.5*  PLT 236  --  205 149*  MCV 103.8*  --  102.9* 104.2*  MCH 30.4  --  30.4 31.0  MCHC 29.3*  --  29.5* 29.8*  RDW 23.3*  --  23.3* 22.5*  LYMPHSABS 1.2  --   --  0.3*  MONOABS 0.6  --   --  0.1  EOSABS 0.1  --   --  0.0  BASOSABS 0.0  --   --  0.0    Recent Labs  Lab 05/13/20 1044 05/13/20 1129 05/13/20 1148 05/13/20 1603 05/14/20 0243 05/14/20 0827 05/15/20 0348  NA 136 139  --   --  136  --  128*  K 4.3 4.1  --   --  3.8  --  3.4*  CL 101  --   --   --  102  --  95*  CO2 25  --   --   --  24  --  19*  GLUCOSE 129*  --   --   --  94  --  216*  BUN 22  --   --   --  30*  --  45*  CREATININE 0.90  --   --   --  1.06*  --  0.93  CALCIUM 9.6  --   --   --  9.3  --  7.7*  AST 22  --   --   --   --   --  14*  ALT 18  --   --   --   --   --  14  ALKPHOS 94  --   --   --   --   --  54  BILITOT 1.3*  --   --   --   --   --  3.9*  ALBUMIN 2.8*  --   --   --   --   --  1.8*  MG  --   --   --   --   --  1.5* 2.0  CRP  --   --   --   --   --  27.0* 17.7*  DDIMER  --   --   --   --   --  2.75* 1.47*  PROCALCITON  --   --   --  3.84 6.01  --  3.96  INR  --   --  1.2  --   --   --   --   BNP  --   --   --  1,176.0*  --  468.5* 534.1*    Recent Labs  Lab 05/13/20 1353 05/13/20 1603 05/14/20 0243 05/14/20 0827 05/15/20 0348  CRP  --   --   --  27.0* 17.7*  DDIMER  --   --   --  2.75* 1.47*  BNP  --  1,176.0*  --  468.5* 534.1*  PROCALCITON  --  3.84 6.01  --  3.96  SARSCOV2NAA NEGATIVE  --   --   --   --     ------------------------------------------------------------------------------------------------------------------ No results for input(s): CHOL, HDL, LDLCALC, TRIG, CHOLHDL, LDLDIRECT in the last 72 hours.  Lab Results  Component Value Date   HGBA1C 5.9 (H) 04/29/2020    ------------------------------------------------------------------------------------------------------------------ No results for input(s): TSH, T4TOTAL, T3FREE, THYROIDAB in the last 72 hours.  Invalid input(s): FREET3 ------------------------------------------------------------------------------------------------------------------ Recent Labs  05/14/20 0827  VITAMINB12 899  FOLATE 25.0  FERRITIN 275  TIBC 169*  IRON 9*  RETICCTPCT 1.9    Coagulation profile Recent Labs  Lab 05/13/20 1148  INR 1.2    Recent Labs    05/14/20 0827 05/15/20 0348  DDIMER 2.75* 1.47*    Cardiac Enzymes No results for input(s): CKMB, TROPONINI, MYOGLOBIN in the last 168 hours.  Invalid input(s): CK ------------------------------------------------------------------------------------------------------------------    Component Value Date/Time   BNP 534.1 (H) 05/15/2020 0348    Micro Results Recent Results (from the past 240 hour(s))  Blood Culture (routine x 2)     Status: None (Preliminary result)   Collection Time: 05/13/20 10:58 AM   Specimen: BLOOD LEFT HAND  Result Value Ref Range Status   Specimen Description BLOOD LEFT HAND  Final   Special Requests   Final    BOTTLES DRAWN AEROBIC AND ANAEROBIC Blood Culture adequate volume   Culture   Final    NO GROWTH 2 DAYS Performed at [facility], [address], [place], [place] [address]    Report Status PENDING  Incomplete  Blood Culture (routine x 2)     Status: None (Preliminary result)   Collection Time: 05/13/20 11:48 AM   Specimen: BLOOD  Result Value Ref Range Status   Specimen Description BLOOD RIGHT ANTECUBITAL  Final   Special Requests   Final    BOTTLES DRAWN AEROBIC AND ANAEROBIC Blood Culture adequate volume   Culture   Final    NO GROWTH 2 DAYS Performed at [facility], [place] [address], [place], [place] [address]    Report Status PENDING  Incomplete  SARS Coronavirus 2 by RT PCR (hospital  order, performed in [facility] hospital lab) Nasopharyngeal Nasopharyngeal Swab     Status: None   Collection Time: 05/13/20  1:53 PM   Specimen: Nasopharyngeal Swab  Result Value Ref Range Status   SARS Coronavirus 2 NEGATIVE NEGATIVE Final    Comment: (NOTE) SARS-CoV-2 target nucleic acids are NOT DETECTED.  The SARS-CoV-2 RNA is generally detectable in upper and lower respiratory specimens during the acute phase of infection. The lowest concentration of SARS-CoV-2 viral copies this assay can detect is 250 copies / mL. A negative result does not preclude SARS-CoV-2 infection and should not be used as the sole basis for treatment or other patient management decisions.  A negative result may occur with improper specimen collection / handling, submission of specimen other than nasopharyngeal swab, presence of viral mutation(s) within the areas targeted by this assay, and inadequate number of viral copies (<250 copies / mL). A negative result must be combined with clinical observations, patient history, and epidemiological information.  Fact Sheet for Patients:   StrictlyIdeas.no  Fact Sheet for Healthcare Providers: BankingDealers.co.za  This test is not yet approved or  cleared by the [place] FDA and has been authorized for detection and/or diagnosis of SARS-CoV-2 by FDA under an Emergency Use Authorization (EUA).  This EUA will remain in effect (meaning this test can be used) for the duration of the COVID-19 declaration under Section 564(b)(1) of the Act, 21 U.S.C. section 360bbb-3(b)(1), unless the authorization is terminated or revoked sooner.  Performed at [facility], [place] [address], [place], [place] [address]   Urine culture     Status: None   Collection Time: 05/13/20  3:12 PM   Specimen: In/Out Cath Urine  Result Value Ref Range Status   Specimen Description IN/OUT CATH URINE  Final   Special Requests NONE   Final   Culture   Final    NO GROWTH Performed at [facility], [place] [name], [place], [place] [address]    Report Status 05/14/2020 FINAL  Final  MRSA PCR Screening     Status: None   Collection Time: 05/14/20  4:46 AM   Specimen: Nasal Mucosa; Nasopharyngeal  Result Value Ref Range Status   MRSA by PCR NEGATIVE NEGATIVE Final    Comment:        The GeneXpert MRSA Assay (FDA approved for NASAL specimens only), is one component of a comprehensive MRSA colonization surveillance program. It is not intended to diagnose MRSA infection nor to guide or monitor treatment for MRSA infections. Performed at [facility], [place] [address] [place], [place] [address]     Radiology Reports CT ABDOMEN PELVIS WO CONTRAST  Result Date: 04/15/2020 CLINICAL DATA:  Worsening shortness of breath. Increased nausea and vomiting. Fever. Patient reports with recent fall with left-sided rib pain. EXAM: CT CHEST, ABDOMEN AND PELVIS WITHOUT CONTRAST TECHNIQUE: Multidetector CT imaging of the chest, abdomen and pelvis was performed following the standard protocol without IV contrast. COMPARISON:  Radiograph earlier this day. Most recent chest CT 07/27/2014, most recent abdominal CT 03/31/2019 FINDINGS: CT CHEST FINDINGS Cardiovascular: No periaortic stranding to suggest injury. Dilated main pulmonary artery at 4.1 cm. Small circumferential pericardial effusion. Multi chamber cardiomegaly. There are prominent venous collaterals anterior to the left axilla and in the included left upper extremity. Decreased density of the blood pool suggesting anemia. Mediastinum/Nodes: No mediastinal hematoma. No pneumomediastinum. Few scattered mediastinal lymph nodes that are not enlarged by size criteria. Limited assessment for hilar adenopathy in the absence of IV contrast. No esophageal wall thickening. Subcentimeter nodule in the left lobe of the thyroid gland, does not need further evaluation based on size.  Lungs/Pleura: Small bilateral pleural effusions. Compressive atelectasis most prominent in the lower lobes. Additional subsegmental atelectasis in the lingula and left upper lobe. No pneumothorax. No evidence of pulmonary contusion. No pulmonary mass. There is mild central bronchial thickening. Musculoskeletal: Remote left anterior first and second rib fractures. Remote anterior eighth, ninth, and tenth rib fractures with callus. Remote posterior tenth rib fracture. No evidence of acute rib fracture. Remote fractures of right anterior/lateral second through ninth ribs. No acute right rib fracture. No sternal fracture. No fracture of the thoracic spine, included clavicles or shoulder girdles. CT ABDOMEN PELVIS FINDINGS Hepatobiliary: Multiple hepatic cysts. No evidence of a [place] injury or perihepatic hematoma. Layering hyperdensity in the gallbladder is likely sludge. No pericholecystic inflammation. No biliary dilatation. Pancreas: Pancreas is unremarkable. No peripancreatic inflammation. No evidence of pancreatic injury or pancreatic cyst. Spleen: Splenomegaly with spleen spanning 15.4 cm cranial caudal. No perisplenic hematoma or focal abnormality. Adrenals/Urinary Tract: No adrenal nodule. Polycystic kidneys which are enlarged with innumerable cysts of varying sizes. 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 There is no transplant hydronephrosis. No evidence of renal injury. Urinary bladder is only minimally distended, partially obscured by streak artifact from left hip arthroplasty. No obvious focal bladder abnormality. Stomach/Bowel: Sigmoid colonic diverticulosis without diverticulitis. Mild colonic tortuosity. Small volume of colonic stool. Appendix is normal. Stomach and small bowel are unremarkable. No obstruction, inflammatory change, or evidence of injury. Vascular/Lymphatic: Aorto bi-iliac atherosclerosis. No aortic aneurysm. No retroperitoneal fluid. No adenopathy. Reproductive: Uterus partially  obscured by streak artifact from left hip arthroplasty. No suspicious adnexal mass. Other: Small amount of fluid in the left  inguinal canal is unchanged from 12/20/2019 CT. No ascites or free fluid in the abdomen. No free air. There is a small fat containing umbilical hernia. Musculoskeletal: Left hip arthroplasty. The bones are under mineralized. Remote left superior and inferior pubic rami fractures. Remote bilateral sacral insufficiency fractures. No evidence of acute fracture. IMPRESSION: 1. No evidence of acute traumatic injury in the chest, abdomen, or pelvis. 2. Small bilateral pleural effusions with compressive atelectasis. 3. Multi chamber cardiomegaly. Small circumferential pericardial effusion. Dilated main pulmonary artery suggesting pulmonary arterial hypertension. 4. No acute findings in the abdomen/pelvis. 5. Polycystic kidneys and liver. Transplant kidney in the right iliac fossa without transplant hydronephrosis. 6. Splenomegaly. 7. Colonic diverticulosis without diverticulitis. 8. Remote bilateral rib fractures.  No acute rib fracture. Aortic Atherosclerosis (ICD10-I70.0). Electronically Signed   By: Keith Rake M.D.   On: 04/15/2020 21:39   DG Chest 1 View  Result Date: 04/16/2020 CLINICAL DATA:  Shortness of breath EXAM: CHEST  1 VIEW COMPARISON:  CT chest 04/15/2020 FINDINGS: Bilateral effusions are less well visualized on today's examination. There is interstitial opacity throughout the lungs with a perihilar and basilar predominance with some fissural and septal thickening as well. The heart is enlarged. Pulmonary vascularity is cephalized indistinct with prominence of the central pulmonary arteries is again noted, better seen on comparison. No acute osseous or soft tissue abnormality. Telemetry leads overlie the chest. IMPRESSION: Findings highly suspicious for congestive heart failure with cardiomegaly, vascular congestion and bilateral effusions with interstitial opacities  suggesting some developing interstitial edema. Electronically Signed   By: [name] M.D.   On: 04/16/2020 01:43   CT Head Wo Contrast  Result Date: 04/15/2020 CLINICAL DATA:  Status post trauma. EXAM: CT HEAD WITHOUT CONTRAST TECHNIQUE: Contiguous axial images were obtained from the base of the skull through the vertex without intravenous contrast. COMPARISON:  Apr 19, 2019 FINDINGS: Brain: No evidence of acute infarction, hemorrhage, hydrocephalus, extra-axial collection or mass lesion/mass effect. Vascular: No hyperdense vessel or unexpected calcification. Skull: Normal. Negative for fracture or focal lesion. Sinuses/Orbits: No acute finding. Other: None. IMPRESSION: No acute intracranial pathology. Electronically Signed   By: Virgina Norfolk M.D.   On: 04/15/2020 21:44   CT CHEST WO CONTRAST  Result Date: 05/14/2020 CLINICAL DATA:  64 year old female with shortness of breath and chest pain. EXAM: CT CHEST WITHOUT CONTRAST TECHNIQUE: Multidetector CT imaging of the chest was performed following the standard protocol without IV contrast. COMPARISON:  Chest radiograph dated 04/29/2020. FINDINGS: Evaluation of this exam is limited in the absence of intravenous contrast. Cardiovascular: There is mild cardiomegaly. No pericardial effusion. Coronary vascular calcification. There is mild atherosclerotic calcification of the thoracic aorta. There is mild dilatation of the main pulmonary trunk suggestive of pulmonary hypertension. Clinical correlation is recommended. Evaluation of the vasculature is limited in the absence of intravenous contrast. Mediastinum/Nodes: No hilar or mediastinal adenopathy. The esophagus and the thyroid gland are grossly unremarkable. No mediastinal fluid collection. Lungs/Pleura: Small bilateral pleural effusions similar to prior CT with associated partial compressive atelectasis of the lower lobes. Pneumonia is not excluded. Clinical correlation is recommended. There are bibasilar  linear atelectasis/scarring. No pneumothorax. The central airways are patent. Upper Abdomen: Please see the report for the accompanying CT abdomen pelvis. Musculoskeletal: No chest wall mass or suspicious bone lesions identified. IMPRESSION: 1. Small bilateral pleural effusions with associated partial compressive atelectasis of the lower lobes. Pneumonia is not excluded. Clinical correlation is recommended. 2. Mild cardiomegaly with coronary vascular calcification. 3. Mildly dilated  main pulmonary trunk suggestive of pulmonary hypertension. Clinical correlation is recommended. 4. Aortic Atherosclerosis (ICD10-I70.0). Electronically Signed   By: Anner Crete M.D.   On: 05/14/2020 21:51   CT Chest Wo Contrast  Result Date: 04/29/2020 CLINICAL DATA:  Pneumonia. Pleural effusion or abscess suspected. Follow-up to current chest radiograph. EXAM: CT CHEST WITHOUT CONTRAST TECHNIQUE: Multidetector CT imaging of the chest was performed following the standard protocol without IV contrast. COMPARISON:  Current chest radiograph.  CT chest dated 04/15/2020. FINDINGS: Cardiovascular: Heart is mildly enlarged. No pericardial effusion. Minimal left coronary artery calcifications. Aorta is normal in caliber. Minor atherosclerotic calcifications along the descending portion. Main pulmonary artery is dilated to 3.8 cm. Mediastinum/Nodes: No neck base, mediastinal or hilar masses or enlarged lymph nodes. Trachea and esophagus are unremarkable. Lungs/Pleura: Small right and trace left pleural effusions. Mild dependent atelectasis noted in both lower lobes and minimally at the diaphragmatic bases of the left upper lobe lingula right middle lobe and dependent upper lobes. Minor linear atelectasis in the left upper lobe. No convincing pneumonia and no evidence of pulmonary edema. No mass or suspicious nodule. No pneumothorax. Upper Abdomen: Multiple liver and bilateral renal cysts consistent with polycystic kidney disease. Enlarged  spleen, 14.5 cm greatest transverse dimension. No acute findings in the visualized upper abdomen and no change from the prior CT. Musculoskeletal: Multiple old bilateral rib fractures. No acute fractures. No osteoblastic or osteolytic lesions. IMPRESSION: 1. Small right and trace left pleural effusions. There is associated atelectasis, but no convincing pneumonia and no evidence of pulmonary edema. No abscess. The lung appearance is similar to the previous chest CT. 2. Mild cardiomegaly. 3. Enlarged main pulmonary artery suggesting pulmonary artery hypertension. 4. Stable changes polycystic kidney disease including multiple liver cysts. Stable splenomegaly. Aortic Atherosclerosis (ICD10-I70.0). Electronically Signed   By: Lajean Manes M.D.   On: 04/29/2020 16:57   CT Chest Wo Contrast  Result Date: 04/15/2020 CLINICAL DATA:  Worsening shortness of breath. Increased nausea and vomiting. Fever. Patient reports with recent fall with left-sided rib pain. EXAM: CT CHEST, ABDOMEN AND PELVIS WITHOUT CONTRAST TECHNIQUE: Multidetector CT imaging of the chest, abdomen and pelvis was performed following the standard protocol without IV contrast. COMPARISON:  Radiograph earlier this day. Most recent chest CT 07/27/2014, most recent abdominal CT 03/31/2019 FINDINGS: CT CHEST FINDINGS Cardiovascular: No periaortic stranding to suggest injury. Dilated main pulmonary artery at 4.1 cm. Small circumferential pericardial effusion. Multi chamber cardiomegaly. There are prominent venous collaterals anterior to the left axilla and in the included left upper extremity. Decreased density of the blood pool suggesting anemia. Mediastinum/Nodes: No mediastinal hematoma. No pneumomediastinum. Few scattered mediastinal lymph nodes that are not enlarged by size criteria. Limited assessment for hilar adenopathy in the absence of IV contrast. No esophageal wall thickening. Subcentimeter nodule in the left lobe of the thyroid gland, does not  need further evaluation based on size. Lungs/Pleura: Small bilateral pleural effusions. Compressive atelectasis most prominent in the lower lobes. Additional subsegmental atelectasis in the lingula and left upper lobe. No pneumothorax. No evidence of pulmonary contusion. No pulmonary mass. There is mild central bronchial thickening. Musculoskeletal: Remote left anterior first and second rib fractures. Remote anterior eighth, ninth, and tenth rib fractures with callus. Remote posterior tenth rib fracture. No evidence of acute rib fracture. Remote fractures of right anterior/lateral second through ninth ribs. No acute right rib fracture. No sternal fracture. No fracture of the thoracic spine, included clavicles or shoulder girdles. CT ABDOMEN PELVIS FINDINGS Hepatobiliary: Multiple hepatic  cysts. No evidence of a Paddock injury or perihepatic hematoma. Layering hyperdensity in the gallbladder is likely sludge. No pericholecystic inflammation. No biliary dilatation. Pancreas: Pancreas is unremarkable. No peripancreatic inflammation. No evidence of pancreatic injury or pancreatic cyst. Spleen: Splenomegaly with spleen spanning 15.4 cm cranial caudal. No perisplenic hematoma or focal abnormality. Adrenals/Urinary Tract: No adrenal nodule. Polycystic kidneys which are enlarged with innumerable cysts of varying sizes. 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 There is no transplant hydronephrosis. No evidence of renal injury. Urinary bladder is only minimally distended, partially obscured by streak artifact from left hip arthroplasty. No obvious focal bladder abnormality. Stomach/Bowel: Sigmoid colonic diverticulosis without diverticulitis. Mild colonic tortuosity. Small volume of colonic stool. Appendix is normal. Stomach and small bowel are unremarkable. No obstruction, inflammatory change, or evidence of injury. Vascular/Lymphatic: Aorto bi-iliac atherosclerosis. No aortic aneurysm. No retroperitoneal fluid. No  adenopathy. Reproductive: Uterus partially obscured by streak artifact from left hip arthroplasty. No suspicious adnexal mass. Other: Small amount of fluid in the left inguinal canal is unchanged from 12/20/2019 CT. No ascites or free fluid in the abdomen. No free air. There is a small fat containing umbilical hernia. Musculoskeletal: Left hip arthroplasty. The bones are under mineralized. Remote left superior and inferior pubic rami fractures. Remote bilateral sacral insufficiency fractures. No evidence of acute fracture. IMPRESSION: 1. No evidence of acute traumatic injury in the chest, abdomen, or pelvis. 2. Small bilateral pleural effusions with compressive atelectasis. 3. Multi chamber cardiomegaly. Small circumferential pericardial effusion. Dilated main pulmonary artery suggesting pulmonary arterial hypertension. 4. No acute findings in the abdomen/pelvis. 5. Polycystic kidneys and liver. Transplant kidney in the right iliac fossa without transplant hydronephrosis. 6. Splenomegaly. 7. Colonic diverticulosis without diverticulitis. 8. Remote bilateral rib fractures.  No acute rib fracture. Aortic Atherosclerosis (ICD10-I70.0). Electronically Signed   By: Keith Rake M.D.   On: 04/15/2020 21:39   CT Cervical Spine Wo Contrast  Result Date: 04/15/2020 CLINICAL DATA:  Status post trauma. EXAM: CT CERVICAL SPINE WITHOUT CONTRAST TECHNIQUE: Multidetector CT imaging of the cervical spine was performed without intravenous contrast. Multiplanar CT image reconstructions were also generated. COMPARISON:  None. FINDINGS: Alignment: Approximately 1 mm to 2 mm anterolisthesis of the C4 vertebral body is seen on C5. Skull base and vertebrae: No acute fracture. No primary bone lesion or focal pathologic process. Soft tissues and spinal canal: No prevertebral fluid or swelling. No visible canal hematoma. Disc levels: Mild endplate sclerosis is seen at the level of C5-C6. Moderate severity intervertebral disc space  narrowing is also seen at this level. Upper chest: Negative. Other: None. IMPRESSION: 1. No acute osseous abnormality. 2. Approximately 1 mm to 2 mm anterolisthesis of the C4 vertebral body on C5. 3. Moderate severity degenerative changes at the level of C5-C6. Electronically Signed   By: Virgina Norfolk M.D.   On: 04/15/2020 21:26   NM Pulmonary Perfusion  Result Date: 05/14/2020 CLINICAL DATA:  Hypoxemia with elevated D-dimer. EXAM: NUCLEAR MEDICINE PERFUSION LUNG SCAN TECHNIQUE: Perfusion images were obtained in multiple projections after intravenous injection of radiopharmaceutical. Ventilation scans intentionally deferred if perfusion scan and chest x-ray adequate for interpretation during COVID 19 epidemic. RADIOPHARMACEUTICALS:  1.5 mCi Tc-84m MAA IV COMPARISON:  None. FINDINGS: There is normal homogeneous distribution of tracer activity seen throughout both lungs. No segmental or subsegmental perfusion defects are identified. IMPRESSION: Normal nuclear medicine perfusion lung scan. Electronically Signed   By: Virgina Norfolk M.D.   On: 05/14/2020 19:46   CT ABDOMEN PELVIS W CONTRAST  Result Date: 05/14/2020 CLINICAL DATA:  64 year old female with abdominal pain. EXAM: CT ABDOMEN AND PELVIS WITH CONTRAST TECHNIQUE: Multidetector CT imaging of the abdomen and pelvis was performed using the standard protocol following bolus administration of intravenous contrast. CONTRAST:  143mL OMNIPAQUE IOHEXOL 300 MG/ML  SOLN COMPARISON:  CT abdomen pelvis dated 04/15/2020. FINDINGS: Lower chest: Partially visualized small bilateral pleural effusions with associated bibasilar atelectasis. Please see report for chest CT. No intra-abdominal free air or free fluid. Hepatobiliary: Multiple hepatic hypodense lesions measure up to 3 cm. The larger lesions demonstrate fluid attenuation most consistent with cysts and smaller lesions are not well characterized. No calcified gallstone. There is a small pericholecystic  fluid. Pancreas: Unremarkable. No pancreatic ductal dilatation or surrounding inflammatory changes. Spleen: Top-normal spleen size measuring up to 14 cm in craniocaudal length. Adrenals/Urinary Tract: The adrenal glands unremarkable. Innumerable bilateral renal cysts essentially replacing the renal parenchyma consistent with polycystic kidney disease. There is no hydronephrosis on either side. There is a right lower quadrant renal transplant. There is no hydronephrosis or nephrolithiasis of the transplant kidney. No peritransplant fluid collection. The urinary bladder is decompressed around a Foley catheter. Stomach/Bowel: There is moderate stool throughout the colon. There is no bowel obstruction or active inflammation. The appendix is normal. Vascular/Lymphatic: Mild aortoiliac atherosclerotic disease. The IVC is unremarkable. No portal venous gas. There is no adenopathy. Reproductive: The uterus is retroflexed. No adnexal masses. Other: Small fat containing umbilical hernia. Mild diffuse subcutaneous edema. Musculoskeletal: Osteopenia with degenerative changes of the spine. Total left hip arthroplasty. Old left pubic bone fracture as well as old sacral fractures. No acute osseous pathology. IMPRESSION: 1. No acute intra-abdominal or pelvic pathology. 2. Polycystic kidney disease with a right lower quadrant renal transplant. No hydronephrosis or nephrolithiasis. 3. No bowel obstruction. Normal appendix. 4. Small bilateral pleural effusions with associated bibasilar atelectasis. Please see report for chest CT. 5. Aortic Atherosclerosis (ICD10-I70.0). Electronically Signed   By: Anner Crete M.D.   On: 05/14/2020 21:25   CT L-SPINE NO CHARGE  Result Date: 04/15/2020 CLINICAL DATA:  Back pain. Recent fall. EXAM: CT LUMBAR SPINE WITHOUT CONTRAST TECHNIQUE: Multidetector CT imaging of the lumbar spine was performed without intravenous contrast administration. Multiplanar CT image reconstructions were also  generated. COMPARISON:  Lumbar spine radiographs 12/19/2019. CT pelvis 12/20/2019. FINDINGS: Segmentation: 5 lumbar type vertebrae. Alignment: Normal. Vertebrae: Chronic bilateral sacral fractures with sclerosis and angular deformity of the sacrum anteriorly at the S2 level. Chronic bilateral L5 transverse process fractures. No acute lumbar spine fracture. Small Schmorl's nodes at T11-12 and T12-L1. Paraspinal and other soft tissues: No acute paraspinal soft tissue abnormality identified. Intra-abdominal and pelvic contents reported separately. Disc levels: Intervertebral disc space heights are preserved in the lumbar spine. Disc bulging and severe facet hypertrophy at L4-5 result in mild right greater than left neural foraminal stenosis without evidence of significant spinal stenosis. There is mild disc bulging at L3-4 with minimal to mild right neural foraminal stenosis. IMPRESSION: 1. No acute osseous abnormality identified in the lumbar spine. 2. Chronic bilateral sacral fractures and chronic bilateral L5 transverse process fractures. 3. Severe L4-5 facet hypertrophy with mild right greater than left neural foraminal stenosis. Electronically Signed   By: Logan Bores M.D.   On: 04/15/2020 21:32   Portable chest 1 View  Result Date: 05/14/2020 CLINICAL DATA:  Shortness of breath EXAM: PORTABLE CHEST 1 VIEW COMPARISON:  04/23/2020 FINDINGS: Cardiac shadow is mildly prominent but stable accentuated by the portable technique. Overall inspiratory effort  is again poor although previously seen atelectasis has improved. No focal confluent infiltrate or sizable effusion is noted. No bony abnormality is seen. IMPRESSION: No acute abnormality noted. Electronically Signed   By: Inez Catalina M.D.   On: 05/14/2020 09:28   DG Chest Portable 1 View  Result Date: 05/13/2020 CLINICAL DATA:  Short of breath chest pain EXAM: PORTABLE CHEST 1 VIEW COMPARISON:  04/29/2020 FINDINGS: Hypoventilation with decreased lung volume.  Mild bibasilar atelectasis similar to the prior study Negative for heart failure or pneumonia. Small right pleural effusion. IMPRESSION: Hypoventilation with bibasilar atelectasis unchanged from the prior study Electronically Signed   By: Franchot Gallo M.D.   On: 05/13/2020 10:54   DG Chest Port 1 View  Result Date: 04/29/2020 CLINICAL DATA:  Shortness of breath EXAM: PORTABLE CHEST 1 VIEW COMPARISON:  Apr 16, 2020 FINDINGS: There is atelectatic change in the lower lung zones bilaterally, stable. No edema or airspace opacity. Heart is mildly enlarged. The pulmonary vascularity is within normal limits. No adenopathy. No bone lesions. IMPRESSION: Mild bibasilar atelectasis. No edema or airspace opacity. Heart mildly enlarged with pulmonary vascularity within normal limits. No evident adenopathy. Electronically Signed   By: Lowella Grip III M.D.   On: 04/29/2020 14:39   DG Chest Port 1 View  Result Date: 04/16/2020 CLINICAL DATA:  Shortness of breath EXAM: PORTABLE CHEST 1 VIEW COMPARISON:  Apr 16, 2020 study obtained earlier in the day FINDINGS: Heart is mildly enlarged with mild pulmonary venous hypertension. There is slight interstitial edema. No appreciable consolidation or pleural effusion. There is mild atelectatic change in the lower lung regions bilaterally. No adenopathy. No bone lesions. IMPRESSION: Mild cardiomegaly with a degree of pulmonary vascular congestion. Slight interstitial edema. There may be a degree of congestive heart failure. Suspect somewhat less edema compared to earlier in the day. No new opacity. No consolidation. Mild atelectatic change in each lower lung region. Electronically Signed   By: Lowella Grip III M.D.   On: 04/16/2020 08:46   DG Chest Portable 1 View  Result Date: 04/15/2020 CLINICAL DATA:  64 year old female with history of trauma from a fall complaining of left-sided rib pain. EXAM: PORTABLE CHEST 1 VIEW COMPARISON:  Chest x-ray 04/07/2020. FINDINGS: Lung  volumes are low. Linear opacity in the left mid lung, new compared to the recent prior study, presumably a focus of subsegmental atelectasis. No acute consolidative airspace disease. No pleural effusions. Cephalization of the pulmonary vasculature, without frank pulmonary edema. Heart size is mildly enlarged. Upper mediastinal contours are within normal limits. An old healed left-sided rib fractures are noted. No definite acute displaced rib fractures are identified. IMPRESSION: 1. Low lung volumes with new area of subsegmental atelectasis in the left mid lung. 2. Cardiomegaly with pulmonary venous congestion, but no frank pulmonary edema. 3. Multiple old healed left-sided rib fractures, similar to prior chest CT 07/27/2014. Electronically Signed   By: Vinnie Langton M.D.   On: 04/15/2020 19:23   ECHOCARDIOGRAM COMPLETE  Result Date: 04/17/2020    ECHOCARDIOGRAM REPORT   Patient Name:   HONORE WIPPERFURTH Anacker Date of Exam: 04/17/2020 Medical Rec #:  [id number]         Height:       61.0 in Accession #:    [id number]        Weight:       159.0 lb Date of Birth:  Mar 28, 1956         BSA:  1.713 m Patient Age:    4 years          BP:           138/74 mmHg Patient Gender: F                 HR:           85 bpm. Exam Location:  Inpatient Procedure: 2D Echo, Color Doppler and Cardiac Doppler Indications:    X93.71 Acute diastolic (congestive) heart failure  History:        Patient has prior history of Echocardiogram examinations, most                 recent 04/21/2019. CHF, COPD; Risk Factors:Dyslipidemia.  Sonographer:    Raquel Sarna Senior RDCS Referring Phys: [id number] Sherryll Burger [facility]  Sonographer Comments: Supine on CPAP. IMPRESSIONS  1. Left ventricular ejection fraction, by estimation, is 55 to 60%. The left ventricle has normal function. The left ventricle has no regional wall motion abnormalities. Left ventricular diastolic parameters are consistent with Grade II diastolic dysfunction (pseudonormalization).  Elevated left atrial pressure.  2. Right ventricular systolic function is normal. The right ventricular size is normal. There is mildly elevated pulmonary artery systolic pressure. The estimated right ventricular systolic pressure is 81.0 mmHg.  3. Left atrial size was mildly dilated.  4. The mitral valve is degenerative. Trivial mitral valve regurgitation. No evidence of mitral stenosis.  5. The aortic valve is tricuspid. Aortic valve regurgitation is not visualized. No aortic stenosis is present.  6. The inferior vena cava is dilated in size with <50% respiratory variability, suggesting right atrial pressure of 15 mmHg. Comparison(s): No significant change from prior study. FINDINGS  Left Ventricle: Left ventricular ejection fraction, by estimation, is 55 to 60%. The left ventricle has normal function. The left ventricle has no regional wall motion abnormalities. The left ventricular internal cavity size was normal in size. There is  no left ventricular hypertrophy. Left ventricular diastolic parameters are consistent with Grade II diastolic dysfunction (pseudonormalization). Elevated left atrial pressure. Right Ventricle: The right ventricular size is normal. No increase in right ventricular wall thickness. Right ventricular systolic function is normal. There is mildly elevated pulmonary artery systolic pressure. The tricuspid regurgitant velocity is 2.62  m/s, and with an assumed right atrial pressure of 15 mmHg, the estimated right ventricular systolic pressure is 17.5 mmHg. Left Atrium: Left atrial size was mildly dilated. Right Atrium: Right atrial size was normal in size. Pericardium: Trivial pericardial effusion is present. Presence of pericardial fat pad. Mitral Valve: The mitral valve is degenerative in appearance. Moderate mitral annular calcification. Trivial mitral valve regurgitation. No evidence of mitral valve stenosis. Tricuspid Valve: The tricuspid valve is grossly normal. Tricuspid valve  regurgitation is trivial. No evidence of tricuspid stenosis. Aortic Valve: The aortic valve is tricuspid. Aortic valve regurgitation is not visualized. No aortic stenosis is present. Pulmonic Valve: The pulmonic valve was grossly normal. Pulmonic valve regurgitation is not visualized. No evidence of pulmonic stenosis. Aorta: The aortic root and ascending aorta are structurally normal, with no evidence of dilitation. Venous: The inferior vena cava is dilated in size with less than 50% respiratory variability, suggesting right atrial pressure of 15 mmHg. IAS/Shunts: The atrial septum is grossly normal. Additional Comments: There is a small pleural effusion in the left lateral region.  LEFT VENTRICLE PLAX 2D LVIDd:         5.30 cm  Diastology LVIDs:         2.90  cm  LV e' lateral:   5.33 cm/s LV PW:         0.90 cm  LV E/e' lateral: 23.1 LV IVS:        1.10 cm  LV e' medial:    6.42 cm/s LVOT diam:     2.00 cm  LV E/e' medial:  19.2 LV SV:         82 LV SV Index:   48 LVOT Area:     3.14 cm  RIGHT VENTRICLE RV S prime:     9.25 cm/s TAPSE (M-mode): 1.6 cm LEFT ATRIUM             Index       RIGHT ATRIUM           Index LA diam:        4.40 cm 2.57 cm/m  RA Area:     15.20 cm LA Vol (A2C):   70.3 ml 41.04 ml/m RA Volume:   36.30 ml  21.19 ml/m LA Vol (A4C):   55.6 ml 32.46 ml/m LA Biplane Vol: 65.5 ml 38.24 ml/m  AORTIC VALVE LVOT Vmax:   130.00 cm/s LVOT Vmean:  87.300 cm/s LVOT VTI:    0.260 m  AORTA Ao Root diam: 3.60 cm Ao Asc diam:  3.60 cm MITRAL VALVE                TRICUSPID VALVE MV Area (PHT): 3.08 cm     TR Peak grad:   27.5 mmHg MV Decel Time: 246 msec     TR Vmax:        262.00 cm/s MV E velocity: 123.00 cm/s MV A velocity: 114.00 cm/s  SHUNTS MV E/A ratio:  1.08         Systemic VTI:  0.26 m                             Systemic Diam: 2.00 cm Eleonore Chiquito MD Electronically signed by Eleonore Chiquito MD Signature Date/Time: 04/17/2020/1:27:32 PM    Final    DG Hip Unilat W or Wo Pelvis 2-3 Views  Left  Result Date: 04/15/2020 CLINICAL DATA:  Status post fall 3 days ago. EXAM: DG HIP (WITH OR WITHOUT PELVIS) 2-3V LEFT COMPARISON:  February 01, 2020 FINDINGS: There is no evidence of an acute hip fracture or dislocation. Chronic fractures of the left superior and left inferior pubic rami are present. Fragmentation of the left greater trochanter is again noted. 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 There is no evidence of surrounding lucency to suggest the presence of hardware loosening or infection. There is no evidence of arthropathy or other focal bone abnormality. Radiopaque surgical clips are seen overlying the right acetabulum and inferior aspect of the sacrum on the right. IMPRESSION: 1. No evidence of an acute osseous abnormality. 2. 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 Electronically Signed   By: Virgina Norfolk M.D.   On: 04/15/2020 21:38   VAS [place] LOWER EXTREMITY VENOUS (DVT)  Result Date: 05/13/2020  Lower Venous DVTStudy Indications: SOB, and sepsis.  Comparison Study: 12/16/19, 02/02/20, 03/16/20 all negative for DVT Performing Technologist: June Leap RDMS, RVT  Examination Guidelines: A complete evaluation includes B-mode imaging, spectral Doppler, color Doppler, and power Doppler as needed of all accessible portions of each vessel. Bilateral testing is considered an integral part of a complete examination. Limited examinations for reoccurring indications may be performed  as noted. The reflux portion of the exam is performed with the patient in reverse Trendelenburg.  +---------+---------------+---------+-----------+----------+--------------+ RIGHT    CompressibilityPhasicitySpontaneityPropertiesThrombus Aging +---------+---------------+---------+-----------+----------+--------------+ CFV      Full           Yes      Yes                                 +---------+---------------+---------+-----------+----------+--------------+ SFJ      Full                                                         +---------+---------------+---------+-----------+----------+--------------+ FV Prox  Full                                                        +---------+---------------+---------+-----------+----------+--------------+ FV Mid   Full                                                        +---------+---------------+---------+-----------+----------+--------------+ FV DistalFull                                                        +---------+---------------+---------+-----------+----------+--------------+ PFV      Full                                                        +---------+---------------+---------+-----------+----------+--------------+ POP      Full           Yes      Yes                                 +---------+---------------+---------+-----------+----------+--------------+ PTV      Full                                                        +---------+---------------+---------+-----------+----------+--------------+ PERO     Full                                                        +---------+---------------+---------+-----------+----------+--------------+   +---------+---------------+---------+-----------+----------+--------------+ LEFT     CompressibilityPhasicitySpontaneityPropertiesThrombus Aging +---------+---------------+---------+-----------+----------+--------------+ CFV      Full           Yes  Yes                                 +---------+---------------+---------+-----------+----------+--------------+ SFJ      Full                                                        +---------+---------------+---------+-----------+----------+--------------+ FV Prox  Full                                                        +---------+---------------+---------+-----------+----------+--------------+ FV Mid   Full                                                         +---------+---------------+---------+-----------+----------+--------------+ FV DistalFull                                                        +---------+---------------+---------+-----------+----------+--------------+ PFV      Full                                                        +---------+---------------+---------+-----------+----------+--------------+ POP      Full           Yes      Yes                                 +---------+---------------+---------+-----------+----------+--------------+ PTV      Full                                                        +---------+---------------+---------+-----------+----------+--------------+ PERO     Full                                                        +---------+---------------+---------+-----------+----------+--------------+     Summary: BILATERAL: - No evidence of deep vein thrombosis seen in the lower extremities, bilaterally. -No evidence of popliteal cyst, bilaterally.   *See table(s) above for measurements and observations. Electronically signed by Ruta Hinds MD on 05/13/2020 at 3:35:07 PM.    Final     Time Spent in minutes  30   Lala Lund M.D on 05/15/2020 at 11:28 AM  To page go to [email or web address] - password [facility]

## 2020-05-15 NOTE — TOC Initial Note (Signed)
Transition of Care [facility]) - Initial/Assessment Note    Patient Details  Name: April [name] MRN: [id number] Date of Birth: 03/05/1956  Transition of Care [facility]) CM/SW Contact:    Benard Halsted, LCSW Phone Number: 05/15/2020, 3:21 PM  Clinical Narrative:                 CSW spoke with patient to confirm discharge plans. Patient reported that she has been at [facility] for rehab the past few weeks and really feels she needs to return as she does not feel ready to return home yet. CSW explained that hospital will pursue insurance authorization to see if patient will be approved to return to [place] as she is in her copay days. Patient reported understanding and is in agreement with the plan. She has not received COVID vaccines and will need an updated COVID test closer to discharge.   Expected Discharge Plan: Skilled Nursing Facility Barriers to Discharge: Insurance Authorization, Continued Medical Work up   Patient Goals and CMS Choice Patient states their goals for this hospitalization and ongoing recovery are:: Return to [place] to continue rehab CMS Medicare.gov Compare Post Acute Care list provided to::  (NA) Choice offered to / list presented to : NA  Expected Discharge Plan and Services Expected Discharge Plan: [place] In-house Referral: Clinical Social Work   Post Acute Care Choice: [place] Living arrangements for the past 2 months: [place]                                      Prior Living Arrangements/Services Living arrangements for the past 2 months: [place] Lives with:: Facility Resident Patient language and need for interpreter reviewed:: Yes Do you feel safe going back to the place where you live?: Yes      Need for Family Participation in Patient Care: No (Comment) Care giver support system in place?: Yes (comment)   Criminal Activity/Legal Involvement Pertinent to Current  Situation/Hospitalization: No - Comment as needed  Activities of Daily Living Home Assistive Devices/Equipment: Oxygen, Transfer belt, Wheelchair, Bedside commode/3-in-1 ADL Screening (condition at time of admission) Patient's cognitive ability adequate to safely complete daily activities?: Yes Is the patient deaf or have difficulty hearing?: No Does the patient have difficulty seeing, even when wearing glasses/contacts?: No Does the patient have difficulty concentrating, remembering, or making decisions?: No Patient able to express need for assistance with ADLs?: Yes Does the patient have difficulty dressing or bathing?: Yes Independently performs ADLs?: No Communication: Independent Dressing (OT): Needs assistance Is this a change from baseline?: Pre-admission baseline Grooming: Needs assistance Is this a change from baseline?: Pre-admission baseline Feeding: Independent Bathing: Needs assistance Is this a change from baseline?: Pre-admission baseline Toileting: Needs assistance Is this a change from baseline?: Pre-admission baseline In/Out Bed: Needs assistance Is this a change from baseline?: Pre-admission baseline Walks in Home: Needs assistance Is this a change from baseline?: Pre-admission baseline Does the patient have difficulty walking or climbing stairs?: Yes Weakness of Legs: Both Weakness of Arms/Hands: None  Permission Sought/Granted Permission sought to share information with : Facility Art therapist granted to share information with : Yes, Verbal Permission Granted     Permission granted to share info w AGENCY: [place]        Emotional Assessment Appearance:: Appears older than stated age Attitude/Demeanor/Rapport: Engaged, Gracious Affect (typically observed): Accepting, Appropriate Orientation: : Oriented  to Self, Oriented to Place, Oriented to  Time, Oriented to Situation Alcohol / Substance Use: Not Applicable Psych Involvement: No  (comment)  Admission diagnosis:  PNA (pneumonia) [J18.9] Sepsis, due to unspecified organism, unspecified whether acute organ dysfunction present [facility]) [A41.9] Patient Active Problem List   Diagnosis Date Noted  . Fever 05/15/2020  . Acute on chronic respiratory failure with hypoxia and hypercapnia (HCC)   . Immunocompromised state ([place])   . Decubitus ulcer of coccyx, unstageable ([place]) 05/01/2020  . Asthma 04/29/2020  . Sepsis due to gram-negative UTI ([place]) 04/16/2020  . Pericardial effusion 04/16/2020  . Chronic respiratory failure with hypoxia ([place]) 04/16/2020  . Proteinuria 04/16/2020  . Acute respiratory distress 04/16/2020  . Pressure injury of skin 04/08/2020  . COPD with acute exacerbation ([place]) 04/07/2020  . Acute on chronic respiratory failure with hypoxia ([place]) 03/14/2020  . PNA (pneumonia) 03/13/2020  . SVT (supraventricular tachycardia) ([place])   . Anemia of chronic disease   . Palliative care encounter   . Goals of care, counseling/discussion   . Essential hypertension 02/08/2020  . Glaucoma 02/08/2020  . Sepsis ([place]) 01/31/2020  . Hypoxia   . Shortness of breath   . Fracture of left superior pubic ramus (HCC)   . Pain   . Anxiety state   . Left displaced femoral neck fracture ([place]) 12/15/2019  . Status post total hip replacement, left   . Post-op pain   . Acute blood loss anemia   . Polycystic kidney   . Closed left hip fracture ([place]) 12/07/2019  . Left wrist fracture 12/07/2019  . Medication management 09/22/2019  . Physical deconditioning 09/22/2019  . Hyponatremia 06/29/2019  . Acute respiratory failure with hypoxia ([place]) 04/19/2019  . Acute on chronic diastolic CHF (congestive heart failure) ([place]) 04/19/2019  . Bilateral closed proximal tibial fracture 12/21/2018  . Closed right ankle fracture 12/21/2018  . Fracture 12/21/2018  . Lower urinary tract infectious disease 10/03/2018  . Asthma, chronic, unspecified asthma severity, with acute exacerbation  10/03/2018  . SIRS (systemic inflammatory response syndrome) ([place]) 10/03/2018  . Nausea vomiting and diarrhea 10/02/2018  . Chronic diastolic CHF (congestive heart failure) ([place]) 10/01/2017  . Influenza B 10/01/2017  . Persistent asthma with status asthmaticus   . Pneumonia 07/15/2016  . Asthma, mild intermittent 07/15/2016  . GERD without esophagitis 07/15/2016  . Renal transplant recipient 07/15/2016  . Hyperlipidemia 07/15/2016  . Depression 07/15/2016  . Bronchitis, mucopurulent recurrent ([place]) 08/08/2014  . Chronic cough 07/24/2014  . End stage renal disease ([place]) 03/23/2012  . 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, implant, and graft 03/23/2012   PCP:  [name], MD Pharmacy:  No Pharmacies Listed    Social Determinants of Health (SDOH) Interventions    Readmission Risk Interventions Readmission Risk Prevention Plan 05/15/2020 04/19/2020 02/09/2020  Transportation Screening Complete Complete Complete  PCP or Specialist Appt within 3-5 Days - - -  Not Complete comments - - -  HRI or [place] or Home Care Consult comments - - -  Social Work Consult for [place] consult not completed comments - - -  Palliative Care Screening - - -  Medication Review ([place]) Referral to Pharmacy Complete Referral to Pharmacy  PCP or Specialist appointment within 3-5 days of discharge Complete Complete Complete  HRI or Home Care Consult Complete Complete Complete  SW Recovery Care/Counseling Consult Complete Complete -  Palliative Care Screening Not Applicable  Not Applicable Complete  Skilled Nursing Facility Complete Complete Patient Refused  Some recent data might be hidden

## 2020-05-15 NOTE — Progress Notes (Signed)
We received a consultation request for this patient; however, patient's gastroenterologist is Dr. Juanita Craver.  Dr. Collene Mares has been made aware of the request for consultation.  Salley Slaughter [facility] [phone]

## 2020-05-15 NOTE — Consult Note (Signed)
[facility]    Date of Admission:  05/13/2020     Total days of antibiotics 3  Day 3 vancomycin + cefepime  Suppressive valacyclovir                Reason for Consult: Fevers    Referring Provider: Candiss Norse Primary Care Provider: [name], MD    Assessment: April [name] is a 64 y.o. female with frequent fevers that are occurring about 1-2 times a month requiring hospitalization and treatment. In discussion with her and extensive review of her recent health care encounters there is no clearly defined reason she has had consistent fevers. CT scans are unrevealing. Echo negative. It does not sound as if she had any urinary symptoms to support UTI previously. She does improve rapidly with antibiotics each encounter.   Suspect she has had several acute flares of COPD with secondary bacterial infection - ?triggered/worsened by CHF exacerbations or under-optimized COPD medications.   Her MRSA PCRs have been consistently negative - would D/C vancomycin and continue with Cefepime another 48 hours and observe off. Question if she needs any adjustment to maintenance medications for underlying COPD to help prevent flares.    Plan: 1. D/C vancomycin  2. Continue cefepime 48 hours then stop  3. Would try to get her into outpatient pulmonology at discharge to optimize medications   Principal Problem:   Fever Active Problems:   PNA (pneumonia)   COPD with acute exacerbation ([place])   . allopurinol  300 mg Oral Daily  . ascorbic acid  500 mg Oral Daily  . [START ON 05/19/2020] aspirin  81 mg Oral Daily  . brimonidine  1 drop Both Eyes BID  . Chlorhexidine Gluconate Cloth  6 each Topical Daily  . cholecalciferol  1,000 Units Oral Daily  . collagenase  1 application Topical Daily  . cycloSPORINE  1 drop Both Eyes BID  . docusate sodium  100 mg Oral BID  . dorzolamide  1 drop Both Eyes Daily  . enoxaparin (LOVENOX) injection  1 mg/kg Subcutaneous  Q12H  . feeding supplement (ENSURE ENLIVE)  237 mL Oral BID BM  . feeding supplement (PRO-STAT SUGAR FREE 64)  30 mL Oral BID WC  . ferrous sulfate  325 mg Oral BID WC  . FLUoxetine  40 mg Oral Daily  . gabapentin  200 mg Oral TID  . guaiFENesin  600 mg Oral BID  . insulin aspart  0-9 Units Subcutaneous TID WC  . ipratropium-albuterol  3 mL Nebulization Q6H  . latanoprost  1 drop Both Eyes QHS  . magnesium oxide  400 mg Oral BID  . methylPREDNISolone (SOLU-MEDROL) injection  40 mg Intravenous Q12H  . multivitamin with minerals  1 tablet Oral Daily  . pantoprazole (PROTONIX) IV  40 mg Intravenous Q12H  . senna-docusate  1 tablet Oral Daily  . tacrolimus  0.5 mg Oral QHS  . tacrolimus  1 mg Oral Daily  . valACYclovir  500 mg Oral BID  . verapamil  120 mg Oral Daily  . vitamin B-12  1,000 mcg Oral Daily    HPI: April [name] is a 64 y.o. female readmitted from SNF with recurrent fevers.   PMHx polycycstic kidney disease s/p transplant 05-2012 @ [facility] on chronic suppression with Prograf, myfortic and prednisone 7.5 mg, asthma, remote tobacco use, hyperlipidemia.   April [name] tells me she has been in and out of the hospital since February  following a fall at home that required surgery. She is S/P total left hip arthroplasty and ORIF left comminuted distal radius fracture with Dr. Berenice Primas 12/08/2019 following femoral neck fracture after fall. She required stay in inpatient rehab following surgery. She has had multiple re-admissions detailed below that have required intermittent stays at SNFs due to weakness and medical care.   She states that a majority of the reasons for her admissions have been due to UTIs (which we typically inform her of given she has no symptoms) and shortness of breath. Occasionally the SOB is associated with cough but mostly it feels "tight and heavy."  She states she rarely notices the fevers during these occurrences. She has not had follow up with her pulmonology team  this year and has a new oxygen requirement to manage hypoxia @ 2lpm. She feels like she gets better fairly quickly but thinks it is more due to the solumedrol she gets. She has had no changes in her dose regimens for transplant but has lost significant weight in the last 6 months and awaiting recent levels. She has no abdominal or urinary symptoms presently; no nausea or vomiting, no headaches, vision changes or dizziness; no hip or wrist pain. She does have multiple skin tears over arms but nothing appears infected. She has a small wound on her sacrum that causes her pain but was told this was "pretty small." She does think she gets "too much fluid" whenever she comes to the hospital and feels her legs and arms swell often.    Hospital Encounters: Seen in the ER 01/07/2020 with dyspnea - VQ scan with low probability of PE. Not febrile at this encounter.    Admitted 01/31/2020 with SOB, hypoxia and fever to 103 F in the setting of 1 day history of productive cough/chills/sneezing/sinus congestion/nausea. COVID19 (-). CXR revealed pulmonary edema but no obvious pneumonia. Started on antibiotic therapy with vancomycin + cefepime + flagyl --> ceftriaxone --> d/cd on Augmentin. PE work up again negative this encounter as well. LE DVTs negative. Blood cultures negative. She improved quickly and was able to be weaned from oxygen before discharge. Outpatient follow up with pulmonology, cardiology and new transplant team. Recommended CIR admission but declined and went home with home health.   Readmitted 3/18 - 4/02 from home as she was having recurrent fevers and "burning diarrhea" at this time that got worse. Short episode of dry coughing and dry heaving with fevers to 100.4 F. Started on IV cefepime. Blood cultures negative. Urine unrevealing. Again improved on antibiotics but discharged on oxygen this time to SNF.   Admitted 02/29/2020 - 03/05/2020 to [facility] for SOB without cough. Fevers also reported at this  encounter. Swelling to B/L upper extremities L>R. Felt to be more SOB r/t anxiety. COVID and RVP (-). It does not appear she was given any antibiotics at this encounter.   Seen again in ER 03/08/2020 for SOB at [facility] - discharged on no antibiotics but received IV lasix with seeming improvement.   Admitted 5/16 - 04/11/20 from SNF for   Currently admitted since 6/21 with fevers to nearly 103 F in ER with leukocytosis 18.7K. Has again had resolution of problems on broad therapy with Vanc/Cefepime/Metronidazole.    TTE 04/17/2020 (-) for vegetations. Some degeneration to MV with trivial regurgitation. Mildly elevated pulmonary pressures.   Review of Systems: Review of Systems  Constitutional: Positive for fever and weight loss. Negative for chills and malaise/fatigue.  Respiratory: Positive for shortness of breath and wheezing.  Negative for cough and sputum production.   Cardiovascular: Positive for leg swelling. Negative for chest pain, palpitations and orthopnea.  Gastrointestinal: Negative for abdominal pain, diarrhea and vomiting.  Genitourinary: Negative for dysuria.  Musculoskeletal: Positive for back pain. Negative for myalgias.  Skin: Negative for rash.       Bruising to arms Skin tears   Neurological: Positive for tremors and weakness. Negative for dizziness and headaches.    Past Medical History:  Diagnosis Date  . Arthritis   . Asthma   . CHF (congestive heart failure) ([place])   . COPD (chronic obstructive pulmonary disease) ([place])   . Depression   . Essential hypertension 02/08/2020  . GERD (gastroesophageal reflux disease)   . Hyperlipidemia   . Hyperparathyroidism   . Polycystic kidney   . PONV (postoperative nausea and vomiting)     Social History   Tobacco Use  . Smoking status: Former Smoker    Packs/day: 0.25    Years: 15.00    Pack years: 3.75    Types: Cigarettes    Quit date: 03/22/1998    Years since quitting: 22.1  . Smokeless tobacco: Never Used   Vaping Use  . Vaping Use: Never used  Substance Use Topics  . Alcohol use: No    Comment: social  . Drug use: No    Family History  Problem Relation Age of Onset  . Hypertension Mother   . Heart disease Father        CABG history  . Polycystic kidney disease Father   . Polycystic kidney disease Brother    Allergies  Allergen Reactions  . Infed [Iron Dextran] Other (See Comments)    Chest tightness  . Pentamidine Itching, Shortness Of Breath and Swelling  . Erythromycin [Erythromycin] Other (See Comments)    Mouth Ulcers  . Iohexol Other (See Comments)    Per patient "she has had a kidney transplant and should never have contrast"  . Oxycodone Nausea Only and Nausea And Vomiting  . Erythromycin Rash    Causes breakout in mouth  . Ultram [Tramadol Hcl] Anxiety    OBJECTIVE: Blood pressure (!) 117/57, pulse 97, temperature 97.6 F (36.4 C), temperature source Oral, resp. rate 20, SpO2 100 %.  Physical Exam Constitutional:      Comments: Sitting up in bed. No distress. Breathing comfortably.   Neck:     Vascular: No JVD.  Cardiovascular:     Rate and Rhythm: Normal rate and regular rhythm.     Heart sounds: No murmur heard.   Pulmonary:     Effort: Pulmonary effort is normal. No respiratory distress.     Breath sounds: Normal breath sounds.     Comments: 2 LPM via [place] Chest:     Chest wall: No mass.  Abdominal:     General: Bowel sounds are normal.     Palpations: Abdomen is soft.     Tenderness: There is no abdominal tenderness.  Musculoskeletal:     Cervical back: Normal range of motion.     Right lower leg: No edema.     Left lower leg: No edema.  Lymphadenopathy:     Cervical: No cervical adenopathy.  Skin:    General: Skin is warm and dry.     Findings: Ecchymosis present.     Comments: Skin tear on left posterior forearm - no infection evident  Neurological:     Mental Status: She is alert.     Lab Results Lab Results  Component Value  Date    WBC 8.3 05/15/2020   HGB 8.8 (L) 05/15/2020   HCT 29.0 (L) 05/15/2020   MCV 104.2 (H) 05/15/2020   PLT 149 (L) 05/15/2020    Lab Results  Component Value Date   CREATININE 0.93 05/15/2020   BUN 45 (H) 05/15/2020   NA 128 (L) 05/15/2020   K 3.4 (L) 05/15/2020   CL 95 (L) 05/15/2020   CO2 19 (L) 05/15/2020    Lab Results  Component Value Date   ALT 14 05/15/2020   AST 14 (L) 05/15/2020   ALKPHOS 54 05/15/2020   BILITOT 3.9 (H) 05/15/2020     Microbiology: Recent Results (from the past 240 hour(s))  Blood Culture (routine x 2)     Status: None (Preliminary result)   Collection Time: 05/13/20 10:58 AM   Specimen: BLOOD LEFT HAND  Result Value Ref Range Status   Specimen Description BLOOD LEFT HAND  Final   Special Requests   Final    BOTTLES DRAWN AEROBIC AND ANAEROBIC Blood Culture adequate volume   Culture   Final    NO GROWTH 2 DAYS Performed at [facility], [address], [place], [place] [address]    Report Status PENDING  Incomplete  Blood Culture (routine x 2)     Status: None (Preliminary result)   Collection Time: 05/13/20 11:48 AM   Specimen: BLOOD  Result Value Ref Range Status   Specimen Description BLOOD RIGHT ANTECUBITAL  Final   Special Requests   Final    BOTTLES DRAWN AEROBIC AND ANAEROBIC Blood Culture adequate volume   Culture   Final    NO GROWTH 2 DAYS Performed at [facility], [place] [address] [place], [place] [address]    Report Status PENDING  Incomplete  SARS Coronavirus 2 by RT PCR (hospital order, performed in [facility] hospital lab) Nasopharyngeal Nasopharyngeal Swab     Status: None   Collection Time: 05/13/20  1:53 PM   Specimen: Nasopharyngeal Swab  Result Value Ref Range Status   SARS Coronavirus 2 NEGATIVE NEGATIVE Final    Comment: (NOTE) SARS-CoV-2 target nucleic acids are NOT DETECTED.  The SARS-CoV-2 RNA is generally detectable in upper and lower respiratory specimens during the acute phase of infection. The  lowest concentration of SARS-CoV-2 viral copies this assay can detect is 250 copies / mL. A negative result does not preclude SARS-CoV-2 infection and should not be used as the sole basis for treatment or other patient management decisions.  A negative result may occur with improper specimen collection / handling, submission of specimen other than nasopharyngeal swab, presence of viral mutation(s) within the areas targeted by this assay, and inadequate number of viral copies (<250 copies / mL). A negative result must be combined with clinical observations, patient history, and epidemiological information.  Fact Sheet for Patients:   StrictlyIdeas.no  Fact Sheet for Healthcare Providers: BankingDealers.co.za  This test is not yet approved or  cleared by the [place] FDA and has been authorized for detection and/or diagnosis of SARS-CoV-2 by FDA under an Emergency Use Authorization (EUA).  This EUA will remain in effect (meaning this test can be used) for the duration of the COVID-19 declaration under Section 564(b)(1) of the Act, 21 U.S.C. section 360bbb-3(b)(1), unless the authorization is terminated or revoked sooner.  Performed at [facility], [place] [address], [place], [place] [address]   Urine culture     Status: None   Collection Time: 05/13/20  3:12  PM   Specimen: In/Out Cath Urine  Result Value Ref Range Status   Specimen Description IN/OUT CATH URINE  Final   Special Requests NONE  Final   Culture   Final    NO GROWTH Performed at [facility], [address], [place], [place] [address]    Report Status 05/14/2020 FINAL  Final  MRSA PCR Screening     Status: None   Collection Time: 05/14/20  4:46 AM   Specimen: Nasal Mucosa; Nasopharyngeal  Result Value Ref Range Status   MRSA by PCR NEGATIVE NEGATIVE Final    Comment:        The GeneXpert MRSA Assay (FDA approved for NASAL specimens only), is one  component of a comprehensive MRSA colonization surveillance program. It is not intended to diagnose MRSA infection nor to guide or monitor treatment for MRSA infections. Performed at [facility], [place] [address], [place], [place] [address]     Janene Madeira, MSN, NP-C [facility] [email or web address] Pager: [phone] Office: [phone] [facility] Main Line: [phone]   05/15/2020 2:30 PM

## 2020-05-16 ENCOUNTER — Inpatient Hospital Stay (HOSPITAL_COMMUNITY): Payer: Medicare Other

## 2020-05-16 DIAGNOSIS — J189 Pneumonia, unspecified organism: Secondary | ICD-10-CM

## 2020-05-16 LAB — MAGNESIUM: Magnesium: 2.5 mg/dL — ABNORMAL HIGH (ref 1.7–2.4)

## 2020-05-16 LAB — CBC WITH DIFFERENTIAL/PLATELET
Abs Immature Granulocytes: 0.05 10*3/uL (ref 0.00–0.07)
Basophils Absolute: 0 10*3/uL (ref 0.0–0.1)
Basophils Relative: 0 %
Eosinophils Absolute: 0 10*3/uL (ref 0.0–0.5)
Eosinophils Relative: 0 %
HCT: 26.3 % — ABNORMAL LOW (ref 36.0–46.0)
Hemoglobin: 7.9 g/dL — ABNORMAL LOW (ref 12.0–15.0)
Immature Granulocytes: 1 %
Lymphocytes Relative: 3 %
Lymphs Abs: 0.3 10*3/uL — ABNORMAL LOW (ref 0.7–4.0)
MCH: 30.9 pg (ref 26.0–34.0)
MCHC: 30 g/dL (ref 30.0–36.0)
MCV: 102.7 fL — ABNORMAL HIGH (ref 80.0–100.0)
Monocytes Absolute: 0.1 10*3/uL (ref 0.1–1.0)
Monocytes Relative: 2 %
Neutro Abs: 7.9 10*3/uL — ABNORMAL HIGH (ref 1.7–7.7)
Neutrophils Relative %: 94 %
Platelets: 144 10*3/uL — ABNORMAL LOW (ref 150–400)
RBC: 2.56 MIL/uL — ABNORMAL LOW (ref 3.87–5.11)
RDW: 21.8 % — ABNORMAL HIGH (ref 11.5–15.5)
WBC: 8.4 10*3/uL (ref 4.0–10.5)
nRBC: 0 % (ref 0.0–0.2)

## 2020-05-16 LAB — BPAM RBC
Blood Product Expiration Date: 202107072359
ISSUE DATE / TIME: 202106230556
Unit Type and Rh: 600

## 2020-05-16 LAB — COMPREHENSIVE METABOLIC PANEL
ALT: 14 U/L (ref 0–44)
AST: 16 U/L (ref 15–41)
Albumin: 2.2 g/dL — ABNORMAL LOW (ref 3.5–5.0)
Alkaline Phosphatase: 61 U/L (ref 38–126)
Anion gap: 10 (ref 5–15)
BUN: 69 mg/dL — ABNORMAL HIGH (ref 8–23)
CO2: 21 mmol/L — ABNORMAL LOW (ref 22–32)
Calcium: 9.6 mg/dL (ref 8.9–10.3)
Chloride: 104 mmol/L (ref 98–111)
Creatinine, Ser: 1.35 mg/dL — ABNORMAL HIGH (ref 0.44–1.00)
GFR calc Af Amer: 49 mL/min — ABNORMAL LOW (ref >60)
GFR calc non Af Amer: 42 mL/min — ABNORMAL LOW (ref >60)
Glucose, Bld: 256 mg/dL — ABNORMAL HIGH (ref 70–99)
Potassium: 5.1 mmol/L (ref 3.5–5.1)
Sodium: 135 mmol/L (ref 135–145)
Total Bilirubin: 0.8 mg/dL (ref 0.3–1.2)
Total Protein: 6.2 g/dL — ABNORMAL LOW (ref 6.5–8.1)

## 2020-05-16 LAB — TYPE AND SCREEN
ABO/RH(D): A NEG
Antibody Screen: NEGATIVE
Unit division: 0

## 2020-05-16 LAB — GLUCOSE, CAPILLARY
Glucose-Capillary: 203 mg/dL — ABNORMAL HIGH (ref 70–99)
Glucose-Capillary: 252 mg/dL — ABNORMAL HIGH (ref 70–99)
Glucose-Capillary: 210 mg/dL — ABNORMAL HIGH (ref 70–99)
Glucose-Capillary: 167 mg/dL — ABNORMAL HIGH (ref 70–99)

## 2020-05-16 LAB — PROCALCITONIN: Procalcitonin: 4.17 ng/mL

## 2020-05-16 LAB — D-DIMER, QUANTITATIVE: D-Dimer, Quant: 1 ug/mL-FEU — ABNORMAL HIGH (ref 0.00–0.50)

## 2020-05-16 LAB — C-REACTIVE PROTEIN: CRP: 8.5 mg/dL — ABNORMAL HIGH (ref <1.0)

## 2020-05-16 LAB — BRAIN NATRIURETIC PEPTIDE: B Natriuretic Peptide: 608.9 pg/mL — ABNORMAL HIGH (ref 0.0–100.0)

## 2020-05-16 MED ORDER — LACTATED RINGERS IV SOLN: INTRAVENOUS | Status: DC

## 2020-05-16 MED ORDER — IPRATROPIUM-ALBUTEROL 0.5-2.5 (3) MG/3ML IN SOLN
3.0000 mL | Freq: Three times a day (TID) | RESPIRATORY_TRACT | Status: DC
  Administered 2020-05-17 – 2020-05-18 (×6): 3 mL via RESPIRATORY_TRACT
  Filled 2020-05-16 (×6): qty 3

## 2020-05-16 MED ORDER — ENOXAPARIN SODIUM 40 MG/0.4ML ~~LOC~~ SOLN
40.0000 mg | SUBCUTANEOUS | Status: DC
  Administered 2020-05-17 – 2020-05-22 (×7): 40 mg via SUBCUTANEOUS
  Filled 2020-05-16 (×9): qty 0.4

## 2020-05-16 MED ORDER — METHYLPREDNISOLONE SODIUM SUCC 40 MG IJ SOLR
40.0000 mg | Freq: Every day | INTRAMUSCULAR | Status: DC
  Administered 2020-05-16 – 2020-05-18 (×3): 40 mg via INTRAVENOUS
  Filled 2020-05-16 (×3): qty 1

## 2020-05-16 MED ORDER — GABAPENTIN 100 MG PO CAPS
100.0000 mg | ORAL_CAPSULE | Freq: Three times a day (TID) | ORAL | Status: DC
  Administered 2020-05-16 – 2020-05-22 (×18): 100 mg via ORAL
  Filled 2020-05-16 (×18): qty 1

## 2020-05-16 MED ORDER — FUROSEMIDE 40 MG PO TABS
40.0000 mg | ORAL_TABLET | Freq: Once | ORAL | Status: AC
  Administered 2020-05-16: 40 mg via ORAL
  Filled 2020-05-16: qty 1

## 2020-05-16 MED ORDER — VERAPAMIL HCL ER 180 MG PO TBCR
180.0000 mg | EXTENDED_RELEASE_TABLET | Freq: Every day | ORAL | Status: DC
  Administered 2020-05-17 – 2020-05-22 (×6): 180 mg via ORAL
  Filled 2020-05-16 (×6): qty 1

## 2020-05-16 MED ORDER — SODIUM POLYSTYRENE SULFONATE 15 GM/60ML PO SUSP
15.0000 g | Freq: Once | ORAL | Status: AC
  Administered 2020-05-16: 15 g via ORAL
  Filled 2020-05-16: qty 60

## 2020-05-16 MED ORDER — LACTATED RINGERS IV SOLN: INTRAVENOUS | Status: AC

## 2020-05-16 NOTE — Progress Notes (Signed)
Subjective: No reports of any GI bleeding.  Struggling to breathe.  Objective: Vital signs in last 24 hours: Temp:  [97.7 F (36.5 C)-98.1 F (36.7 C)] 98.1 F (36.7 C) (06/24 1200) Pulse Rate:  [81-94] 94 (06/24 1200) Resp:  [15-22] 22 (06/24 1200) BP: (99-132)/(58-67) 132/64 (06/24 1200) SpO2:  [94 %-99 %] 96 % (06/24 1411) Last BM Date: 05/12/20  Intake/Output from previous day: 06/23 0701 - 06/24 0700 In: 1336.8 [P.O.:360; I.V.:381.8; Blood:345; IV Piggyback:250] Out: 1200 [Urine:1200] Intake/Output this shift: No intake/output data recorded.  General appearance: alert and some difficulty with breathing GI: soft, non-tender; bowel sounds normal; no masses,  no organomegaly  Lab Results: Recent Labs    05/14/20 0243 05/14/20 0243 05/15/20 0348 05/15/20 1138 05/16/20 0350  WBC 18.7*  --  8.3  --  8.4  HGB 8.5*   < > 6.7* 8.8* 7.9*  HCT 28.8*   < > 22.5* 29.0* 26.3*  PLT 205  --  149*  --  144*   < > = values in this interval not displayed.   BMET Recent Labs    05/14/20 0243 05/15/20 0348 05/16/20 0350  NA 136 128* 135  K 3.8 3.4* 5.1  CL 102 95* 104  CO2 24 19* 21*  GLUCOSE 94 216* 256*  BUN 30* 45* 69*  CREATININE 1.06* 0.93 1.35*  CALCIUM 9.3 7.7* 9.6   LFT Recent Labs    05/16/20 0350  PROT 6.2*  ALBUMIN 2.2*  AST 16  ALT 14  ALKPHOS 61  BILITOT 0.8   PT/INR No results for input(s): LABPROT, INR in the last 72 hours. Hepatitis Panel No results for input(s): HEPBSAG, HCVAB, HEPAIGM, HEPBIGM in the last 72 hours. C-Diff No results for input(s): CDIFFTOX in the last 72 hours. Fecal Lactopherrin No results for input(s): FECLLACTOFRN in the last 72 hours.  Studies/Results: CT CHEST WO CONTRAST  Result Date: 05/14/2020 CLINICAL DATA:  64 year old female with shortness of breath and chest pain. EXAM: CT CHEST WITHOUT CONTRAST TECHNIQUE: Multidetector CT imaging of the chest was performed following the standard protocol without IV contrast.  COMPARISON:  Chest radiograph dated 04/29/2020. FINDINGS: Evaluation of this exam is limited in the absence of intravenous contrast. Cardiovascular: There is mild cardiomegaly. No pericardial effusion. Coronary vascular calcification. There is mild atherosclerotic calcification of the thoracic aorta. There is mild dilatation of the main pulmonary trunk suggestive of pulmonary hypertension. Clinical correlation is recommended. Evaluation of the vasculature is limited in the absence of intravenous contrast. Mediastinum/Nodes: No hilar or mediastinal adenopathy. The esophagus and the thyroid gland are grossly unremarkable. No mediastinal fluid collection. Lungs/Pleura: Small bilateral pleural effusions similar to prior CT with associated partial compressive atelectasis of the lower lobes. Pneumonia is not excluded. Clinical correlation is recommended. There are bibasilar linear atelectasis/scarring. No pneumothorax. The central airways are patent. Upper Abdomen: Please see the report for the accompanying CT abdomen pelvis. Musculoskeletal: No chest wall mass or suspicious bone lesions identified. IMPRESSION: 1. Small bilateral pleural effusions with associated partial compressive atelectasis of the lower lobes. Pneumonia is not excluded. Clinical correlation is recommended. 2. Mild cardiomegaly with coronary vascular calcification. 3. Mildly dilated main pulmonary trunk suggestive of pulmonary hypertension. Clinical correlation is recommended. 4. Aortic Atherosclerosis (ICD10-I70.0). Electronically Signed   By: Anner Crete M.D.   On: 05/14/2020 21:51   CT ABDOMEN PELVIS W CONTRAST  Result Date: 05/14/2020 CLINICAL DATA:  64 year old female with abdominal pain. EXAM: CT ABDOMEN AND PELVIS WITH CONTRAST TECHNIQUE: Multidetector CT imaging  of the abdomen and pelvis was performed using the standard protocol following bolus administration of intravenous contrast. CONTRAST:  170mL OMNIPAQUE IOHEXOL 300 MG/ML  SOLN  COMPARISON:  CT abdomen pelvis dated 04/15/2020. FINDINGS: Lower chest: Partially visualized small bilateral pleural effusions with associated bibasilar atelectasis. Please see report for chest CT. No intra-abdominal free air or free fluid. Hepatobiliary: Multiple hepatic hypodense lesions measure up to 3 cm. The larger lesions demonstrate fluid attenuation most consistent with cysts and smaller lesions are not well characterized. No calcified gallstone. There is a small pericholecystic fluid. Pancreas: Unremarkable. No pancreatic ductal dilatation or surrounding inflammatory changes. Spleen: Top-normal spleen size measuring up to 14 cm in craniocaudal length. Adrenals/Urinary Tract: The adrenal glands unremarkable. Innumerable bilateral renal cysts essentially replacing the renal parenchyma consistent with polycystic kidney disease. There is no hydronephrosis on either side. There is a right lower quadrant renal transplant. There is no hydronephrosis or nephrolithiasis of the transplant kidney. No peritransplant fluid collection. The urinary bladder is decompressed around a Foley catheter. Stomach/Bowel: There is moderate stool throughout the colon. There is no bowel obstruction or active inflammation. The appendix is normal. Vascular/Lymphatic: Mild aortoiliac atherosclerotic disease. The IVC is unremarkable. No portal venous gas. There is no adenopathy. Reproductive: The uterus is retroflexed. No adnexal masses. Other: Small fat containing umbilical hernia. Mild diffuse subcutaneous edema. Musculoskeletal: Osteopenia with degenerative changes of the spine. Total left hip arthroplasty. Old left pubic bone fracture as well as old sacral fractures. No acute osseous pathology. IMPRESSION: 1. No acute intra-abdominal or pelvic pathology. 2. Polycystic kidney disease with a right lower quadrant renal transplant. No hydronephrosis or nephrolithiasis. 3. No bowel obstruction. Normal appendix. 4. Small bilateral pleural  effusions with associated bibasilar atelectasis. Please see report for chest CT. 5. Aortic Atherosclerosis (ICD10-I70.0). Electronically Signed   By: Anner Crete M.D.   On: 05/14/2020 21:25   MR SACRUM SI JOINTS WO CONTRAST  Result Date: 05/15/2020 CLINICAL DATA:  Chronic sacral wound with recurrent fevers. Evaluate for osteomyelitis. EXAM: MRI SACRUM WITHOUT CONTRAST TECHNIQUE: Multiplanar, multisequence MR imaging of the sacrum was performed. No intravenous contrast was administered. COMPARISON:  CT abdomen pelvis from yesterday. FINDINGS: Bones/Joint/Cartilage No suspicious marrow signal abnormality. No acute fracture or dislocation. Chronic bilateral sacral ala and S2 fractures. Prior left total hip arthroplasty with associated susceptibility artifact. Muscles and Tendons Bilateral piriformis, gluteal, and adductor muscle edema and atrophy. Soft tissue Left-sided sacrococcygeal ulcer. No fluid collection or hematoma. No soft tissue mass. Trace free fluid in the pelvis.  Fibroid uterus. IMPRESSION: 1. Left-sided sacrococcygeal ulcer without evidence of osteomyelitis or abscess. Electronically Signed   By: Titus Dubin M.D.   On: 05/15/2020 16:49   DG Chest Port 1 View  Result Date: 05/16/2020 CLINICAL DATA:  Shortness of breath. EXAM: PORTABLE CHEST 1 VIEW COMPARISON:  One-view chest x-ray 05/14/2020. CT chest without contrast 05/14/2020. FINDINGS: The a heart size is normal. Mild pulmonary vascular congestion is noted. Small right pleural effusion and atelectasis is again noted. No new airspace disease is present. IMPRESSION: 1. Stable appearance of small right pleural effusion and atelectasis. 2. Mild pulmonary vascular congestion. Electronically Signed   By: San Morelle M.D.   On: 05/16/2020 08:06    Medications:  Scheduled: . allopurinol  300 mg Oral Daily  . ascorbic acid  500 mg Oral Daily  . [START ON 05/19/2020] aspirin  81 mg Oral Daily  . brimonidine  1 drop Both Eyes BID   . Chlorhexidine Gluconate Cloth  6 each Topical Daily  . cholecalciferol  1,000 Units Oral Daily  . collagenase  1 application Topical Daily  . cycloSPORINE  1 drop Both Eyes BID  . docusate sodium  100 mg Oral BID  . dorzolamide  1 drop Both Eyes Daily  . [START ON 05/17/2020] enoxaparin (LOVENOX) injection  40 mg Subcutaneous Q24H  . feeding supplement (ENSURE ENLIVE)  237 mL Oral BID BM  . feeding supplement (PRO-STAT SUGAR FREE 64)  30 mL Oral BID WC  . ferrous sulfate  325 mg Oral BID WC  . FLUoxetine  40 mg Oral Daily  . gabapentin  100 mg Oral TID  . guaiFENesin  600 mg Oral BID  . insulin aspart  0-9 Units Subcutaneous TID WC  . ipratropium-albuterol  3 mL Nebulization Q6H  . latanoprost  1 drop Both Eyes QHS  . magnesium oxide  400 mg Oral BID  . methylPREDNISolone (SOLU-MEDROL) injection  40 mg Intravenous Daily  . multivitamin with minerals  1 tablet Oral Daily  . pantoprazole (PROTONIX) IV  40 mg Intravenous Q12H  . senna-docusate  1 tablet Oral Daily  . tacrolimus  0.5 mg Oral QHS  . tacrolimus  1 mg Oral Daily  . valACYclovir  500 mg Oral BID  . [START ON 05/17/2020] verapamil  180 mg Oral Daily  . vitamin B-12  1,000 mcg Oral Daily   Continuous: . ceFEPime (MAXIPIME) IV 2 g (05/16/20 1149)    Assessment/Plan: 1) Anemia. 2) Sepsis improving. 3) Pulmonary HTN. 4) S/p renal transplant.   The patient is stable.  She was taken off of oxygen, but she appears and reports some difficult with breathing.  There is some straining with breathing.  Pulse ox is 96%.  Her anemia improved with the blood transfusion.  She has a history of chronic anemia, but her HGB did slowly decline over the years.  Physically she does not appear to be in any condition to undergo endoscopic evaluation.  Further evaluation can be performed while she is in the hospital or even as an outpatient.  Currently her other medical issues take precedence as her HGB is currently stable.  Plan: 1) Monitor  HGB.  I will not round daily, but I will follow her HGB. 2) Outpatient GI work up is a viable option for her.  LOS: 3 days   Doyle Kunath D 05/16/2020, 4:52 PM

## 2020-05-16 NOTE — Progress Notes (Signed)
No BM this shift. Unable to obtain Occult blood.

## 2020-05-16 NOTE — Progress Notes (Signed)
PROGRESS NOTE                                                                                                                                                                                                             Patient Demographics:    April [name], is a 64 y.o. female, DOB - [birth year], ERX:[id number]  Admit date - 05/13/2020   Admitting Physician Lequita Halt, MD  Outpatient Primary MD for the patient is [name], MD  LOS - 3  Chief Complaint  Patient presents with   Shortness of Breath   Chest Pain       Brief Narrative - April [name] is a 64 y.o. female nursing home resident at [facility] with medical history significant of polycystic kidney disease s/p renal transplant (on prednisone, mycophenalate, prograf), COPD on 2L [place] baseline, diastolic CHF, chronic hypoxic resp failure, obesity, HTN, pulmonary HTN, recurrent UTI, presented to ED withSOB wheezingand fever with no clear source, she has recently been admitted 3-4 times in the last few weeks for similar problems.  She states that about 6 months ago she had a mechanical fall with left-sided hip fracture and since then she has been able to go home and has been in a nursing home.  Her main complaint was gradually progressive shortness of breath, fatigue and low-grade fevers.   Subjective:   Patient in bed, appears comfortable, denies any headache, no fever, no chest pain or pressure, no shortness of breath , no abdominal pain. No focal weakness.   Assessment  & Plan :     1.  Sepsis with leukocytosis, elevated procalcitonin and CRP.  In a patient with kidney transplant, on chronic immunosuppressive therapy with no clear-cut source.  At this time she has had multiple similar admissions in the recent past and no convincing source has been found, blood cultures are pending, she was initially on IV vancomycin and cefepime transition to cefepime only on 05/15/2020.  Recent echo from few weeks ago  reviewed without any hint of vegetation, CT chest abdomen pelvis remains unrevealing, unremarkable V/Q and leg venous ultrasound, MRI of the sacrum does not show any abscess or osteomyelitis around the sacral wound, pending CMV titers.  Sepsis pathophysiology has resolved, continue supportive care with gentle hydration with IV fluids.  ID following currently on cefepime with negative cultures so far.   Recent Labs  Lab 05/13/20 1044 05/13/20 1104 05/13/20 1603 05/14/20 0243 05/14/20 0827 05/15/20 0348 05/16/20 0350  WBC 18.8*  --   --  18.7*  --  8.3 8.4  PLT 236  --   --  205  --  149* 144*  CRP  --   --   --   --  27.0* 17.7* 8.5*  DDIMER  --   --   --   --  2.75* 1.47* 1.00*  PROCALCITON  --   --  3.84 6.01  --  3.96 4.17  LATICACIDVEN  --  0.9  --   --  1.1  --   --     2.  History of renal transplant in 2013 due to underlying polycystic kidney disease.  Case discussed with nephrologist Dr. Vanetta Mulders on 05/14/2020, okay for IV contrast with hydration, IV fluids continued, discontinue Myfortic as suggested by nephrology, continue other home medications with higher dose IV steroids, gradually taper to chronic home dose oral steroid.  3.  History of SVT. verapamil dose adjusted further.  4.  History of pulmonary hypertension.  Supportive care.  5.  Chronic grade 2 diastolic dysfunction with EF 55%.  Currently compensated clinically, BNP elevated likely due to pulmonary hypertension.  She has no oxygen demand and no crackles on exam at this time she is compensated.  6.  Chronic iron deficiency anemia.  Oral iron supplementation, no source of ongoing active bleeding however H&H dropped further on 05/15/2020 due to heme dilution, 1 unit packed RBC on 05/15/2020.  Since she has had a recurrent drop in H&H even previous admissions I have requested GI to consider doing EGD and colonoscopy to find out eventual source.  7.  Trace leg edema due to malnutrition.  Protein supplements.  8.   Borderline elevated D-dimer.  Due to inflammation improved after hydration, on prophylactic dose Lovenox, VQ scan and leg venous ultrasound negative.  9.  Hyponatremia.  Was due to dehydration improved after IV fluids.   10.  AKI.  Likely due to sepsis and hypotension, continue to hydrate and avoid nephrotoxins, monitor closely.   11.  Urinary retention.  Foley placed on 05/14/2020.   12. Small unstageable sacrococcygeal decubitus ulcer which is chronic.  Wound care following.  Appears noninfected.  See wound care note for details.  MRI being done to rule out any deep-seated infection.  Pressure Injury 04/29/20 Coccyx Mid Stage 3 -  Full thickness tissue loss. Subcutaneous fat may be visible but bone, tendon or muscle are NOT exposed. (Active)  04/29/20 2130  Location: Coccyx  Location Orientation: Mid  Staging: Stage 3 -  Full thickness tissue loss. Subcutaneous fat may be visible but bone, tendon or muscle are NOT exposed.  Wound Description (Comments):   Present on Admission: Yes      Condition - Extremely Guarded  Family Communication  :   Left for husband April [name] [phone] on 05/14/2020 at 13:14 PM  Daughter April [name] [phone] on 02/10/2020 at 11:36 AM.   Code Status :  Full  Consults  :  PCCM, Renal Dr. Moshe Cipro over the phone, ID, GI  Procedures  :    MRI - pelvis-  VQ - NML  CT chest abdomen pelvis - 1. No acute intra-abdominal or pelvic pathology. 2. Polycystic kidney disease with a right lower quadrant renal transplant. No hydronephrosis or nephrolithiasis. 3. No bowel obstruction. Normal appendix. 4. Small bilateral pleural effusions with associated bibasilar atelectasis. Please see report for chest CT. 5. Aortic Atherosclerosis (ICD10-I70.0).  No acute findings in the chest except changes suggestive of pulmonary hypertension.  Mild bilateral pleural effusions with compressive atelectasis.  Bilateral leg venous ultrasound.  No DVT.     TTE. 04/17/20  -  1. Left ventricular ejection fraction, by estimation, is 55 to 60%. The left ventricle has normal function. The left ventricle has no regional wall motion abnormalities. Left ventricular diastolic parameters are consistent with Grade II diastolic dysfunction (pseudonormalization). Elevated left atrial pressure.  2. Right ventricular systolic function is normal. The right ventricular size is normal. There is mildly elevated pulmonary artery systolic pressure. The estimated right ventricular systolic pressure is 16.1 mmHg.  3. Left atrial size was mildly dilated.  4. The mitral valve is degenerative. Trivial mitral valve regurgitation. No evidence of mitral stenosis.  5. The aortic valve is tricuspid. Aortic valve regurgitation is not visualized. No aortic stenosis is present.  6. The inferior vena cava is dilated in size with <50% respiratory variability, suggesting right atrial pressure of 15 mmHg. Comparison(s): No significant change from prior study.   PUD Prophylaxis : PPI  Disposition Plan  :    Status is: Inpatient  Remains inpatient appropriate because:IV treatments appropriate due to intensity of illness or inability to take PO   Dispo: The patient is from: SNF              Anticipated d/c is to: SNF              Anticipated d/c date is: > 3 days              Patient currently is not medically stable to d/c.   DVT Prophylaxis  :  Lovenox    Lab Results  Component Value Date   PLT 144 (L) 05/16/2020    Diet :  Diet Order            Diet Heart Room service appropriate? No; Fluid consistency: Thin  Diet effective now                  Inpatient Medications Scheduled Meds:  allopurinol  300 mg Oral Daily   ascorbic acid  500 mg Oral Daily   [START ON 05/19/2020] aspirin  81 mg Oral Daily   brimonidine  1 drop Both Eyes BID   Chlorhexidine Gluconate Cloth  6 each Topical Daily   cholecalciferol  1,000 Units Oral Daily   collagenase  1 application Topical Daily    cycloSPORINE  1 drop Both Eyes BID   docusate sodium  100 mg Oral BID   dorzolamide  1 drop Both Eyes Daily   [START ON 05/17/2020] enoxaparin (LOVENOX) injection  40 mg Subcutaneous Q24H   feeding supplement (ENSURE ENLIVE)  237 mL Oral BID BM   feeding supplement (PRO-STAT SUGAR FREE 64)  30 mL Oral BID WC   ferrous sulfate  325 mg Oral BID WC   FLUoxetine  40 mg Oral Daily   gabapentin  200 mg Oral TID   guaiFENesin  600 mg Oral BID   insulin aspart  0-9 Units Subcutaneous TID WC   ipratropium-albuterol  3 mL Nebulization Q6H   latanoprost  1 drop Both Eyes QHS   magnesium oxide  400 mg Oral BID   methylPREDNISolone (SOLU-MEDROL) injection  40 mg Intravenous Daily   multivitamin with minerals  1 tablet Oral Daily   pantoprazole (PROTONIX) IV  40 mg Intravenous Q12H   senna-docusate  1 tablet Oral Daily   tacrolimus  0.5 mg Oral QHS   tacrolimus  1 mg Oral Daily   valACYclovir  500 mg Oral BID   [START  ON 05/17/2020] verapamil  180 mg Oral Daily   vitamin B-12  1,000 mcg Oral Daily   Continuous Infusions:  ceFEPime (MAXIPIME) IV 2 g (05/15/20 2255)   lactated ringers 75 mL/hr at 05/16/20 0843   PRN Meds:.acetaminophen **OR** [DISCONTINUED] acetaminophen, hydrOXYzine, levalbuterol, [DISCONTINUED] ondansetron **OR** ondansetron (ZOFRAN) IV  Antibiotics  :   Anti-infectives (From admission, onward)   Start     Dose/Rate Route Frequency Ordered Stop   05/14/20 0200  vancomycin (VANCOREADY) IVPB 750 mg/150 mL  Status:  Discontinued        750 mg 150 mL/hr over 60 Minutes Intravenous Every 12 hours 05/13/20 1239 05/15/20 1522   05/14/20 0000  ceFEPIme (MAXIPIME) 2 g in sodium chloride 0.9 % 100 mL IVPB     Discontinue     2 g 200 mL/hr over 30 Minutes Intravenous Every 12 hours 05/13/20 1239 05/17/20 1521   05/13/20 1500  valACYclovir (VALTREX) tablet 500 mg     Discontinue     500 mg Oral 2 times daily 05/13/20 1420     05/13/20 1115  ceFEPIme  (MAXIPIME) 2 g in sodium chloride 0.9 % 100 mL IVPB        2 g 200 mL/hr over 30 Minutes Intravenous  Once 05/13/20 1105 05/13/20 1310   05/13/20 1115  metroNIDAZOLE (FLAGYL) IVPB 500 mg        500 mg 100 mL/hr over 60 Minutes Intravenous  Once 05/13/20 1105 05/13/20 1311   05/13/20 1115  vancomycin (VANCOCIN) IVPB 1000 mg/200 mL premix  Status:  Discontinued        1,000 mg 200 mL/hr over 60 Minutes Intravenous  Once 05/13/20 1105 05/13/20 1107   05/13/20 1115  vancomycin (VANCOREADY) IVPB 1500 mg/300 mL        1,500 mg 150 mL/hr over 120 Minutes Intravenous  Once 05/13/20 1107 05/13/20 1612          Objective:   Vitals:   05/16/20 0327 05/16/20 0400 05/16/20 0819 05/16/20 0831  BP:  (!) 99/58  129/67  Pulse:  83    Resp:  17    Temp:  97.9 F (36.6 C) 97.8 F (36.6 C)   TempSrc:  Oral Oral   SpO2: 96% 94%      SpO2: 94 % O2 Flow Rate (L/min): 1 L/min FiO2 (%): 40 %  Wt Readings from Last 3 Encounters:  05/02/20 74.4 kg  04/19/20 73.2 kg  04/08/20 74.2 kg     Intake/Output Summary (Last 24 hours) at 05/16/2020 0952 Last data filed at 05/16/2020 0658 Gross per 24 hour  Intake 991.8 ml  Output 1200 ml  Net -208.2 ml     Physical Exam  Awake Alert, No new F.N deficits, Normal affect [place].AT,PERRAL Supple Neck,No JVD, No cervical lymphadenopathy appriciated.  Symmetrical Chest wall movement, Good air movement bilaterally, CTAB RRR,No Gallops, Rubs or new Murmurs, No Parasternal Heave +ve B.Sounds, Abd Soft, No tenderness, No organomegaly appriciated, No rebound - guarding or rigidity. No Cyanosis, Clubbing or edema, No new Rash or bruise    Pressure Injury 04/29/20 Coccyx Mid Stage 3 -  Full thickness tissue loss. Subcutaneous fat may be visible but bone, tendon or muscle are NOT exposed. (Active)  04/29/20 2130  Location: Coccyx  Location Orientation: Mid  Staging: Stage 3 -  Full thickness tissue loss. Subcutaneous fat may be visible but bone, tendon or  muscle are NOT exposed.  Wound Description (Comments):   Present on Admission: Yes  Data Review:    Recent Labs  Lab 05/13/20 1044 05/13/20 1044 05/13/20 1129 05/14/20 0243 05/15/20 0348 05/15/20 1138 05/16/20 0350  WBC 18.8*  --   --  18.7* 8.3  --  8.4  HGB 9.5*   < > 12.6 8.5* 6.7* 8.8* 7.9*  HCT 32.4*   < > 37.0 28.8* 22.5* 29.0* 26.3*  PLT 236  --   --  205 149*  --  144*  MCV 103.8*  --   --  102.9* 104.2*  --  102.7*  MCH 30.4  --   --  30.4 31.0  --  30.9  MCHC 29.3*  --   --  29.5* 29.8*  --  30.0  RDW 23.3*  --   --  23.3* 22.5*  --  21.8*  LYMPHSABS 1.2  --   --   --  0.3*  --  0.3*  MONOABS 0.6  --   --   --  0.1  --  0.1  EOSABS 0.1  --   --   --  0.0  --  0.0  BASOSABS 0.0  --   --   --  0.0  --  0.0   < > = values in this interval not displayed.    Recent Labs  Lab 05/13/20 1044 05/13/20 1129 05/13/20 1148 05/13/20 1603 05/14/20 0243 05/14/20 0827 05/15/20 0348 05/16/20 0350  NA 136 139  --   --  136  --  128* 135  K 4.3 4.1  --   --  3.8  --  3.4* 5.1  CL 101  --   --   --  102  --  95* 104  CO2 25  --   --   --  24  --  19* 21*  GLUCOSE 129*  --   --   --  94  --  216* 256*  BUN 22  --   --   --  30*  --  45* 69*  CREATININE 0.90  --   --   --  1.06*  --  0.93 1.35*  CALCIUM 9.6  --   --   --  9.3  --  7.7* 9.6  AST 22  --   --   --   --   --  14* 16  ALT 18  --   --   --   --   --  14 14  ALKPHOS 94  --   --   --   --   --  54 61  BILITOT 1.3*  --   --   --   --   --  3.9* 0.8  ALBUMIN 2.8*  --   --   --   --   --  1.8* 2.2*  MG  --   --   --   --   --  1.5* 2.0 2.5*  CRP  --   --   --   --   --  27.0* 17.7* 8.5*  DDIMER  --   --   --   --   --  2.75* 1.47* 1.00*  PROCALCITON  --   --   --  3.84 6.01  --  3.96 4.17  INR  --   --  1.2  --   --   --   --   --   BNP  --   --   --  1,176.0*  --  468.5* 534.1* 608.9*  Recent Labs  Lab 05/13/20 1353 05/13/20 1603 05/14/20 0243 05/14/20 0827 05/15/20 0348 05/16/20 0350  CRP  --    --   --  27.0* 17.7* 8.5*  DDIMER  --   --   --  2.75* 1.47* 1.00*  BNP  --  1,176.0*  --  468.5* 534.1* 608.9*  PROCALCITON  --  3.84 6.01  --  3.96 4.17  SARSCOV2NAA NEGATIVE  --   --   --   --   --     ------------------------------------------------------------------------------------------------------------------ No results for input(s): CHOL, HDL, LDLCALC, TRIG, CHOLHDL, LDLDIRECT in the last 72 hours.  Lab Results  Component Value Date   HGBA1C 5.9 (H) 04/29/2020   ------------------------------------------------------------------------------------------------------------------ No results for input(s): TSH, T4TOTAL, T3FREE, THYROIDAB in the last 72 hours.  Invalid input(s): FREET3 ------------------------------------------------------------------------------------------------------------------ Recent Labs    05/14/20 0827  VITAMINB12 899  FOLATE 25.0  FERRITIN 275  TIBC 169*  IRON 9*  RETICCTPCT 1.9    Coagulation profile Recent Labs  Lab 05/13/20 1148  INR 1.2    Recent Labs    05/15/20 0348 05/16/20 0350  DDIMER 1.47* 1.00*    Cardiac Enzymes No results for input(s): CKMB, TROPONINI, MYOGLOBIN in the last 168 hours.  Invalid input(s): CK ------------------------------------------------------------------------------------------------------------------    Component Value Date/Time   BNP 608.9 (H) 05/16/2020 0350    Micro Results Recent Results (from the past 240 hour(s))  Blood Culture (routine x 2)     Status: None (Preliminary result)   Collection Time: 05/13/20 10:58 AM   Specimen: BLOOD LEFT HAND  Result Value Ref Range Status   Specimen Description BLOOD LEFT HAND  Final   Special Requests   Final    BOTTLES DRAWN AEROBIC AND ANAEROBIC Blood Culture adequate volume   Culture   Final    NO GROWTH 2 DAYS Performed at [facility], [address] [place], [place] [address]    Report Status PENDING  Incomplete  Blood Culture (routine x  2)     Status: None (Preliminary result)   Collection Time: 05/13/20 11:48 AM   Specimen: BLOOD  Result Value Ref Range Status   Specimen Description BLOOD RIGHT ANTECUBITAL  Final   Special Requests   Final    BOTTLES DRAWN AEROBIC AND ANAEROBIC Blood Culture adequate volume   Culture   Final    NO GROWTH 2 DAYS Performed at [facility], [place] [address], [place], [place] [address]    Report Status PENDING  Incomplete  SARS Coronavirus 2 by RT PCR (hospital order, performed in [facility] hospital lab) Nasopharyngeal Nasopharyngeal Swab     Status: None   Collection Time: 05/13/20  1:53 PM   Specimen: Nasopharyngeal Swab  Result Value Ref Range Status   SARS Coronavirus 2 NEGATIVE NEGATIVE Final    Comment: (NOTE) SARS-CoV-2 target nucleic acids are NOT DETECTED.  The SARS-CoV-2 RNA is generally detectable in upper and lower respiratory specimens during the acute phase of infection. The lowest concentration of SARS-CoV-2 viral copies this assay can detect is 250 copies / mL. A negative result does not preclude SARS-CoV-2 infection and should not be used as the sole basis for treatment or other patient management decisions.  A negative result may occur with improper specimen collection / handling, submission of specimen other than nasopharyngeal swab, presence of viral mutation(s) within the areas targeted by this assay, and inadequate number of viral copies (<250 copies / mL). A negative result must be combined with  clinical observations, patient history, and epidemiological information.  Fact Sheet for Patients:   StrictlyIdeas.no  Fact Sheet for Healthcare Providers: BankingDealers.co.za  This test is not yet approved or  cleared by the [place] FDA and has been authorized for detection and/or diagnosis of SARS-CoV-2 by FDA under an Emergency Use Authorization (EUA).  This EUA will remain in effect (meaning this test  can be used) for the duration of the COVID-19 declaration under Section 564(b)(1) of the Act, 21 U.S.C. section 360bbb-3(b)(1), unless the authorization is terminated or revoked sooner.  Performed at [facility], [place] [address] [place], [place] [address]   Urine culture     Status: None   Collection Time: 05/13/20  3:12 PM   Specimen: In/Out Cath Urine  Result Value Ref Range Status   Specimen Description IN/OUT CATH URINE  Final   Special Requests NONE  Final   Culture   Final    NO GROWTH Performed at [facility], [place] [address], [place], [place] [address]    Report Status 05/14/2020 FINAL  Final  MRSA PCR Screening     Status: None   Collection Time: 05/14/20  4:46 AM   Specimen: Nasal Mucosa; Nasopharyngeal  Result Value Ref Range Status   MRSA by PCR NEGATIVE NEGATIVE Final    Comment:        The GeneXpert MRSA Assay (FDA approved for NASAL specimens only), is one component of a comprehensive MRSA colonization surveillance program. It is not intended to diagnose MRSA infection nor to guide or monitor treatment for MRSA infections. Performed at [facility], [place] [address] [place], [place] [address]     Radiology Reports CT CHEST WO CONTRAST  Result Date: 05/14/2020 CLINICAL DATA:  64 year old female with shortness of breath and chest pain. EXAM: CT CHEST WITHOUT CONTRAST TECHNIQUE: Multidetector CT imaging of the chest was performed following the standard protocol without IV contrast. COMPARISON:  Chest radiograph dated 04/29/2020. FINDINGS: Evaluation of this exam is limited in the absence of intravenous contrast. Cardiovascular: There is mild cardiomegaly. No pericardial effusion. Coronary vascular calcification. There is mild atherosclerotic calcification of the thoracic aorta. There is mild dilatation of the main pulmonary trunk suggestive of pulmonary hypertension. Clinical correlation is recommended. Evaluation of the vasculature is limited in  the absence of intravenous contrast. Mediastinum/Nodes: No hilar or mediastinal adenopathy. The esophagus and the thyroid gland are grossly unremarkable. No mediastinal fluid collection. Lungs/Pleura: Small bilateral pleural effusions similar to prior CT with associated partial compressive atelectasis of the lower lobes. Pneumonia is not excluded. Clinical correlation is recommended. There are bibasilar linear atelectasis/scarring. No pneumothorax. The central airways are patent. Upper Abdomen: Please see the report for the accompanying CT abdomen pelvis. Musculoskeletal: No chest wall mass or suspicious bone lesions identified. IMPRESSION: 1. Small bilateral pleural effusions with associated partial compressive atelectasis of the lower lobes. Pneumonia is not excluded. Clinical correlation is recommended. 2. Mild cardiomegaly with coronary vascular calcification. 3. Mildly dilated main pulmonary trunk suggestive of pulmonary hypertension. Clinical correlation is recommended. 4. Aortic Atherosclerosis (ICD10-I70.0). Electronically Signed   By: Anner Crete M.D.   On: 05/14/2020 21:51   CT Chest Wo Contrast  Result Date: 04/29/2020 CLINICAL DATA:  Pneumonia. Pleural effusion or abscess suspected. Follow-up to current chest radiograph. EXAM: CT CHEST WITHOUT CONTRAST TECHNIQUE: Multidetector CT imaging of the chest was performed following the standard protocol without IV contrast. COMPARISON:  Current chest radiograph.  CT chest dated 04/15/2020. FINDINGS: Cardiovascular:  Heart is mildly enlarged. No pericardial effusion. Minimal left coronary artery calcifications. Aorta is normal in caliber. Minor atherosclerotic calcifications along the descending portion. Main pulmonary artery is dilated to 3.8 cm. Mediastinum/Nodes: No neck base, mediastinal or hilar masses or enlarged lymph nodes. Trachea and esophagus are unremarkable. Lungs/Pleura: Small right and trace left pleural effusions. Mild dependent atelectasis  noted in both lower lobes and minimally at the diaphragmatic bases of the left upper lobe lingula right middle lobe and dependent upper lobes. Minor linear atelectasis in the left upper lobe. No convincing pneumonia and no evidence of pulmonary edema. No mass or suspicious nodule. No pneumothorax. Upper Abdomen: Multiple liver and bilateral renal cysts consistent with polycystic kidney disease. Enlarged spleen, 14.5 cm greatest transverse dimension. No acute findings in the visualized upper abdomen and no change from the prior CT. Musculoskeletal: Multiple old bilateral rib fractures. No acute fractures. No osteoblastic or osteolytic lesions. IMPRESSION: 1. Small right and trace left pleural effusions. There is associated atelectasis, but no convincing pneumonia and no evidence of pulmonary edema. No abscess. The lung appearance is similar to the previous chest CT. 2. Mild cardiomegaly. 3. Enlarged main pulmonary artery suggesting pulmonary artery hypertension. 4. Stable changes polycystic kidney disease including multiple liver cysts. Stable splenomegaly. Aortic Atherosclerosis (ICD10-I70.0). Electronically Signed   By: Lajean Manes M.D.   On: 04/29/2020 16:57   NM Pulmonary Perfusion  Result Date: 05/14/2020 CLINICAL DATA:  Hypoxemia with elevated D-dimer. EXAM: NUCLEAR MEDICINE PERFUSION LUNG SCAN TECHNIQUE: Perfusion images were obtained in multiple projections after intravenous injection of radiopharmaceutical. Ventilation scans intentionally deferred if perfusion scan and chest x-ray adequate for interpretation during COVID 19 epidemic. RADIOPHARMACEUTICALS:  1.5 mCi Tc-43m MAA IV COMPARISON:  None. FINDINGS: There is normal homogeneous distribution of tracer activity seen throughout both lungs. No segmental or subsegmental perfusion defects are identified. IMPRESSION: Normal nuclear medicine perfusion lung scan. Electronically Signed   By: Virgina Norfolk M.D.   On: 05/14/2020 19:46   CT ABDOMEN  PELVIS W CONTRAST  Result Date: 05/14/2020 CLINICAL DATA:  64 year old female with abdominal pain. EXAM: CT ABDOMEN AND PELVIS WITH CONTRAST TECHNIQUE: Multidetector CT imaging of the abdomen and pelvis was performed using the standard protocol following bolus administration of intravenous contrast. CONTRAST:  198mL OMNIPAQUE IOHEXOL 300 MG/ML  SOLN COMPARISON:  CT abdomen pelvis dated 04/15/2020. FINDINGS: Lower chest: Partially visualized small bilateral pleural effusions with associated bibasilar atelectasis. Please see report for chest CT. No intra-abdominal free air or free fluid. Hepatobiliary: Multiple hepatic hypodense lesions measure up to 3 cm. The larger lesions demonstrate fluid attenuation most consistent with cysts and smaller lesions are not well characterized. No calcified gallstone. There is a small pericholecystic fluid. Pancreas: Unremarkable. No pancreatic ductal dilatation or surrounding inflammatory changes. Spleen: Top-normal spleen size measuring up to 14 cm in craniocaudal length. Adrenals/Urinary Tract: The adrenal glands unremarkable. Innumerable bilateral renal cysts essentially replacing the renal parenchyma consistent with polycystic kidney disease. There is no hydronephrosis on either side. There is a right lower quadrant renal transplant. There is no hydronephrosis or nephrolithiasis of the transplant kidney. No peritransplant fluid collection. The urinary bladder is decompressed around a Foley catheter. Stomach/Bowel: There is moderate stool throughout the colon. There is no bowel obstruction or active inflammation. The appendix is normal. Vascular/Lymphatic: Mild aortoiliac atherosclerotic disease. The IVC is unremarkable. No portal venous gas. There is no adenopathy. Reproductive: The uterus is retroflexed. No adnexal masses. Other: Small fat containing umbilical hernia. Mild diffuse subcutaneous edema. Musculoskeletal:  Osteopenia with degenerative changes of the spine. Total  left hip arthroplasty. Old left pubic bone fracture as well as old sacral fractures. No acute osseous pathology. IMPRESSION: 1. No acute intra-abdominal or pelvic pathology. 2. Polycystic kidney disease with a right lower quadrant renal transplant. No hydronephrosis or nephrolithiasis. 3. No bowel obstruction. Normal appendix. 4. Small bilateral pleural effusions with associated bibasilar atelectasis. Please see report for chest CT. 5. Aortic Atherosclerosis (ICD10-I70.0). Electronically Signed   By: Anner Crete M.D.   On: 05/14/2020 21:25   MR SACRUM SI JOINTS WO CONTRAST  Result Date: 05/15/2020 CLINICAL DATA:  Chronic sacral wound with recurrent fevers. Evaluate for osteomyelitis. EXAM: MRI SACRUM WITHOUT CONTRAST TECHNIQUE: Multiplanar, multisequence MR imaging of the sacrum was performed. No intravenous contrast was administered. COMPARISON:  CT abdomen pelvis from yesterday. FINDINGS: Bones/Joint/Cartilage No suspicious marrow signal abnormality. No acute fracture or dislocation. Chronic bilateral sacral ala and S2 fractures. Prior left total hip arthroplasty with associated susceptibility artifact. Muscles and Tendons Bilateral piriformis, gluteal, and adductor muscle edema and atrophy. Soft tissue Left-sided sacrococcygeal ulcer. No fluid collection or hematoma. No soft tissue mass. Trace free fluid in the pelvis.  Fibroid uterus. IMPRESSION: 1. Left-sided sacrococcygeal ulcer without evidence of osteomyelitis or abscess. Electronically Signed   By: Titus Dubin M.D.   On: 05/15/2020 16:49   DG Chest Port 1 View  Result Date: 05/16/2020 CLINICAL DATA:  Shortness of breath. EXAM: PORTABLE CHEST 1 VIEW COMPARISON:  One-view chest x-ray 05/14/2020. CT chest without contrast 05/14/2020. FINDINGS: The a heart size is normal. Mild pulmonary vascular congestion is noted. Small right pleural effusion and atelectasis is again noted. No new airspace disease is present. IMPRESSION: 1. Stable appearance  of small right pleural effusion and atelectasis. 2. Mild pulmonary vascular congestion. Electronically Signed   By: San Morelle M.D.   On: 05/16/2020 08:06   Portable chest 1 View  Result Date: 05/14/2020 CLINICAL DATA:  Shortness of breath EXAM: PORTABLE CHEST 1 VIEW COMPARISON:  04/23/2020 FINDINGS: Cardiac shadow is mildly prominent but stable accentuated by the portable technique. Overall inspiratory effort is again poor although previously seen atelectasis has improved. No focal confluent infiltrate or sizable effusion is noted. No bony abnormality is seen. IMPRESSION: No acute abnormality noted. Electronically Signed   By: Inez Catalina M.D.   On: 05/14/2020 09:28   DG Chest Portable 1 View  Result Date: 05/13/2020 CLINICAL DATA:  Short of breath chest pain EXAM: PORTABLE CHEST 1 VIEW COMPARISON:  04/29/2020 FINDINGS: Hypoventilation with decreased lung volume. Mild bibasilar atelectasis similar to the prior study Negative for heart failure or pneumonia. Small right pleural effusion. IMPRESSION: Hypoventilation with bibasilar atelectasis unchanged from the prior study Electronically Signed   By: Franchot Gallo M.D.   On: 05/13/2020 10:54   DG Chest Port 1 View  Result Date: 04/29/2020 CLINICAL DATA:  Shortness of breath EXAM: PORTABLE CHEST 1 VIEW COMPARISON:  Apr 16, 2020 FINDINGS: There is atelectatic change in the lower lung zones bilaterally, stable. No edema or airspace opacity. Heart is mildly enlarged. The pulmonary vascularity is within normal limits. No adenopathy. No bone lesions. IMPRESSION: Mild bibasilar atelectasis. No edema or airspace opacity. Heart mildly enlarged with pulmonary vascularity within normal limits. No evident adenopathy. Electronically Signed   By: Lowella Grip III M.D.   On: 04/29/2020 14:39   ECHOCARDIOGRAM COMPLETE  Result Date: 04/17/2020    ECHOCARDIOGRAM REPORT   Patient Name:   NATASSIA GUTHRIDGE Follett Date of Exam: 04/17/2020 Medical  Rec #:  [id number]          Height:       61.0 in Accession #:    [id number]        Weight:       159.0 lb Date of Birth:  [birth year]         BSA:          1.713 m Patient Age:    29 years          BP:           138/74 mmHg Patient Gender: F                 HR:           85 bpm. Exam Location:  Inpatient Procedure: 2D Echo, Color Doppler and Cardiac Doppler Indications:    O24.23 Acute diastolic (congestive) heart failure  History:        Patient has prior history of Echocardiogram examinations, most                 recent 04/21/2019. CHF, COPD; Risk Factors:Dyslipidemia.  Sonographer:    Raquel Sarna Senior RDCS Referring Phys: [id number] Sherryll Burger [facility]  Sonographer Comments: Supine on CPAP. IMPRESSIONS  1. Left ventricular ejection fraction, by estimation, is 55 to 60%. The left ventricle has normal function. The left ventricle has no regional wall motion abnormalities. Left ventricular diastolic parameters are consistent with Grade II diastolic dysfunction (pseudonormalization). Elevated left atrial pressure.  2. Right ventricular systolic function is normal. The right ventricular size is normal. There is mildly elevated pulmonary artery systolic pressure. The estimated right ventricular systolic pressure is 15.4 mmHg.  3. Left atrial size was mildly dilated.  4. The mitral valve is degenerative. Trivial mitral valve regurgitation. No evidence of mitral stenosis.  5. The aortic valve is tricuspid. Aortic valve regurgitation is not visualized. No aortic stenosis is present.  6. The inferior vena cava is dilated in size with <50% respiratory variability, suggesting right atrial pressure of 15 mmHg. Comparison(s): No significant change from prior study. FINDINGS  Left Ventricle: Left ventricular ejection fraction, by estimation, is 55 to 60%. The left ventricle has normal function. The left ventricle has no regional wall motion abnormalities. The left ventricular internal cavity size was normal in size. There is  no left ventricular  hypertrophy. Left ventricular diastolic parameters are consistent with Grade II diastolic dysfunction (pseudonormalization). Elevated left atrial pressure. Right Ventricle: The right ventricular size is normal. No increase in right ventricular wall thickness. Right ventricular systolic function is normal. There is mildly elevated pulmonary artery systolic pressure. The tricuspid regurgitant velocity is 2.62  m/s, and with an assumed right atrial pressure of 15 mmHg, the estimated right ventricular systolic pressure is 00.8 mmHg. Left Atrium: Left atrial size was mildly dilated. Right Atrium: Right atrial size was normal in size. Pericardium: Trivial pericardial effusion is present. Presence of pericardial fat pad. Mitral Valve: The mitral valve is degenerative in appearance. Moderate mitral annular calcification. Trivial mitral valve regurgitation. No evidence of mitral valve stenosis. Tricuspid Valve: The tricuspid valve is grossly normal. Tricuspid valve regurgitation is trivial. No evidence of tricuspid stenosis. Aortic Valve: The aortic valve is tricuspid. Aortic valve regurgitation is not visualized. No aortic stenosis is present. Pulmonic Valve: The pulmonic valve was grossly normal. Pulmonic valve regurgitation is not visualized. No evidence of pulmonic stenosis. Aorta: The aortic root and ascending aorta are structurally normal, with no evidence of dilitation. Venous: The inferior vena  cava is dilated in size with less than 50% respiratory variability, suggesting right atrial pressure of 15 mmHg. IAS/Shunts: The atrial septum is grossly normal. Additional Comments: There is a small pleural effusion in the left lateral region.  LEFT VENTRICLE PLAX 2D LVIDd:         5.30 cm  Diastology LVIDs:         2.90 cm  LV e' lateral:   5.33 cm/s LV PW:         0.90 cm  LV E/e' lateral: 23.1 LV IVS:        1.10 cm  LV e' medial:    6.42 cm/s LVOT diam:     2.00 cm  LV E/e' medial:  19.2 LV SV:         82 LV SV Index:    48 LVOT Area:     3.14 cm  RIGHT VENTRICLE RV S prime:     9.25 cm/s TAPSE (M-mode): 1.6 cm LEFT ATRIUM             Index       RIGHT ATRIUM           Index LA diam:        4.40 cm 2.57 cm/m  RA Area:     15.20 cm LA Vol (A2C):   70.3 ml 41.04 ml/m RA Volume:   36.30 ml  21.19 ml/m LA Vol (A4C):   55.6 ml 32.46 ml/m LA Biplane Vol: 65.5 ml 38.24 ml/m  AORTIC VALVE LVOT Vmax:   130.00 cm/s LVOT Vmean:  87.300 cm/s LVOT VTI:    0.260 m  AORTA Ao Root diam: 3.60 cm Ao Asc diam:  3.60 cm MITRAL VALVE                TRICUSPID VALVE MV Area (PHT): 3.08 cm     TR Peak grad:   27.5 mmHg MV Decel Time: 246 msec     TR Vmax:        262.00 cm/s MV E velocity: 123.00 cm/s MV A velocity: 114.00 cm/s  SHUNTS MV E/A ratio:  1.08         Systemic VTI:  0.26 m                             Systemic Diam: 2.00 cm Eleonore Chiquito MD Electronically signed by Eleonore Chiquito MD Signature Date/Time: 04/17/2020/1:27:32 PM    Final    VAS [place] LOWER EXTREMITY VENOUS (DVT)  Result Date: 05/13/2020  Lower Venous DVTStudy Indications: SOB, and sepsis.  Comparison Study: 12/16/19, 02/02/20, 03/16/20 all negative for DVT Performing Technologist: June Leap RDMS, RVT  Examination Guidelines: A complete evaluation includes B-mode imaging, spectral Doppler, color Doppler, and power Doppler as needed of all accessible portions of each vessel. Bilateral testing is considered an integral part of a complete examination. Limited examinations for reoccurring indications may be performed as noted. The reflux portion of the exam is performed with the patient in reverse Trendelenburg.  +---------+---------------+---------+-----------+----------+--------------+  RIGHT     Compressibility Phasicity Spontaneity Properties Thrombus Aging  +---------+---------------+---------+-----------+----------+--------------+  CFV       Full            Yes       Yes                                     +---------+---------------+---------+-----------+----------+--------------+  SFJ       Full                                                             +---------+---------------+---------+-----------+----------+--------------+  FV Prox   Full                                                             +---------+---------------+---------+-----------+----------+--------------+  FV Mid    Full                                                             +---------+---------------+---------+-----------+----------+--------------+  FV Distal Full                                                             +---------+---------------+---------+-----------+----------+--------------+  PFV       Full                                                             +---------+---------------+---------+-----------+----------+--------------+  POP       Full            Yes       Yes                                    +---------+---------------+---------+-----------+----------+--------------+  PTV       Full                                                             +---------+---------------+---------+-----------+----------+--------------+  PERO      Full                                                             +---------+---------------+---------+-----------+----------+--------------+   +---------+---------------+---------+-----------+----------+--------------+  LEFT      Compressibility Phasicity Spontaneity Properties Thrombus Aging  +---------+---------------+---------+-----------+----------+--------------+  CFV       Full            Yes       Yes                                    +---------+---------------+---------+-----------+----------+--------------+  SFJ       Full                                                             +---------+---------------+---------+-----------+----------+--------------+  FV Prox   Full                                                              +---------+---------------+---------+-----------+----------+--------------+  FV Mid    Full                                                             +---------+---------------+---------+-----------+----------+--------------+  FV Distal Full                                                             +---------+---------------+---------+-----------+----------+--------------+  PFV       Full                                                             +---------+---------------+---------+-----------+----------+--------------+  POP       Full            Yes       Yes                                    +---------+---------------+---------+-----------+----------+--------------+  PTV       Full                                                             +---------+---------------+---------+-----------+----------+--------------+  PERO      Full                                                             +---------+---------------+---------+-----------+----------+--------------+     Summary: BILATERAL: - No evidence of deep vein thrombosis seen in the lower extremities, bilaterally. -No evidence of popliteal cyst, bilaterally.   *See table(s) above for measurements and observations. Electronically signed by Ruta Hinds MD on 05/13/2020 at 3:35:07 PM.    Final     Time Spent in minutes  [place] M.D on 05/16/2020 at 9:52 AM  To page go to [email or web address] - password [facility]

## 2020-05-16 NOTE — Progress Notes (Signed)
Physical Therapy Treatment Patient Details Name: April [name] MRN: [id number] DOB: Jun 07, 1956 Today's Date: 05/16/2020    History of Present Illness Patient is a 64 y/o female who presents with SOB and chest pain. Admitted with sepsis secondary to UTI and Acute on chronic hypoxic and hypercapnic respiratory failure secondary to acute pulmonary edema versus less likely atypical pneumonia. . CXR-pulmonary edema. PMH includes polycystic kidney disease s/p renal transplant (on prednisone, mycophenalate, prograf), COPD on 2L 02 baseline, diastolic CHF, chronic hypoxic respiratory failure, obesity, recurrent UTI.    PT Comments    Patient able to stand from elevated EOB with use of stedy and 2 person assist 4 times today. She was limited by fatigue and bil hip pain.   Follow Up Recommendations  SNF     Equipment Recommendations  None recommended by PT    Recommendations for Other Services       Precautions / Restrictions Precautions Precautions: Fall    Mobility  Bed Mobility Overal bed mobility: Needs Assistance Bed Mobility: Sit to Sidelying;Rolling;Sidelying to Sit Rolling: Min guard Sidelying to sit: Min guard     Sit to sidelying: Min assist General bed mobility comments: HOB 20 degrees  Transfers Overall transfer level: Needs assistance   Transfers: Sit to/from Stand Sit to Stand: From elevated surface;Min assist         General transfer comment: stood x 4   Ambulation/Gait                 Stairs             Wheelchair Mobility    Modified Rankin (Stroke Patients Only)       Balance Overall balance assessment: Needs assistance Sitting-balance support: Feet unsupported;No upper extremity supported Sitting balance-Leahy Scale: Good Sitting balance - Comments: Supervision at EOB due to lines   Standing balance support: Bilateral upper extremity supported Standing balance-Leahy Scale: Poor Standing balance comment: reliant on external  support                            Cognition Arousal/Alertness: Awake/alert Behavior During Therapy: WFL for tasks assessed/performed Overall Cognitive Status: Within Functional Limits for tasks assessed (not specifically tested)                                        Exercises      General Comments General comments (skin integrity, edema, etc.): Pt reports not feeling well, yet was willing to work with PT. On room air throughout session with sats >93%; RR as high as 38 with good response to cues for PLB       Pertinent Vitals/Pain Pain Assessment: Faces Faces Pain Scale: Hurts even more Pain Location: bil hips Pain Descriptors / Indicators: Guarding;Sore;Discomfort Pain Intervention(s): Limited activity within patient's tolerance;Monitored during session;Repositioned    Home Living                      Prior Function            PT Goals (current goals can now be found in the care plan section) Acute Rehab PT Goals Patient Stated Goal: return to SNF, "dont want to be a burden on my family" Time For Goal Achievement: 05/28/20 Potential to Achieve Goals: Fair Progress towards PT goals: Progressing toward goals    Frequency    Min  2X/week      PT Plan Current plan remains appropriate    Co-evaluation              AM-PAC PT "6 Clicks" Mobility   Outcome Measure  Help needed turning from your back to your side while in a flat bed without using bedrails?: A Little Help needed moving from lying on your back to sitting on the side of a flat bed without using bedrails?: A Lot Help needed moving to and from a bed to a chair (including a wheelchair)?: A Lot Help needed standing up from a chair using your arms (e.g., wheelchair or bedside chair)?: A Lot Help needed to walk in hospital room?: A Lot Help needed climbing 3-5 steps with a railing? : Total 6 Click Score: 12    End of Session   Activity Tolerance: Patient limited  by fatigue;Patient limited by pain Patient left: in bed;with call bell/phone within reach;with bed alarm set;with family/visitor present Nurse Communication: Mobility status PT Visit Diagnosis: Other abnormalities of gait and mobility (R26.89);Repeated falls (R29.6);Muscle weakness (generalized) (M62.81);Pain Pain - part of body: Hip (ribs)     Time: 1224-4975 PT Time Calculation (min) (ACUTE ONLY): 38 min  Charges:  $Therapeutic Activity: 38-52 mins                      Arby Barrette, PT Pager [phone]    Rexanne Mano 05/16/2020, 4:37 PM

## 2020-05-16 NOTE — Progress Notes (Signed)
Patient ID: April [name], female   DOB: [birth year], 64 y.o.   MRN: [id number]         [facility]  Date of Admission:  05/13/2020           Day 4 cefepime ASSESSMENT: I cannot find any comment explanation for her recurrent respiratory illnesses leading to 8 admissions in the past 5 months. She seems to improved promptly each time she is admitted most likely due to a bump in her steroids rather than empiric antibiotics.  I note that her serum CMV PCR was negative in 2018.  When she was hospitalized at [facility] this past April CMV IgM was positive.  From review of notes there it does not appear that she had any evidence of clinical CMV infection.  CMV PCR is pending here but is unlikely to shed any light on her recent illnesses.  It is common for people, especially those with previous transplants to have intermittent viremia but this does not indicate invasive infection.  PLAN: 1. Recommend continuing vancomycin overnight and stopping 2. We will sign off now  Principal Problem:   Fever Active Problems:   COPD with acute exacerbation (HCC)   Scheduled Meds: . allopurinol  300 mg Oral Daily  . ascorbic acid  500 mg Oral Daily  . [START ON 05/19/2020] aspirin  81 mg Oral Daily  . brimonidine  1 drop Both Eyes BID  . Chlorhexidine Gluconate Cloth  6 each Topical Daily  . cholecalciferol  1,000 Units Oral Daily  . collagenase  1 application Topical Daily  . cycloSPORINE  1 drop Both Eyes BID  . docusate sodium  100 mg Oral BID  . dorzolamide  1 drop Both Eyes Daily  . [START ON 05/17/2020] enoxaparin (LOVENOX) injection  40 mg Subcutaneous Q24H  . feeding supplement (ENSURE ENLIVE)  237 mL Oral BID BM  . feeding supplement (PRO-STAT SUGAR FREE 64)  30 mL Oral BID WC  . ferrous sulfate  325 mg Oral BID WC  . FLUoxetine  40 mg Oral Daily  . furosemide  40 mg Oral Once  . gabapentin  100 mg Oral TID  . guaiFENesin  600 mg Oral BID  . insulin aspart  0-9  Units Subcutaneous TID WC  . ipratropium-albuterol  3 mL Nebulization Q6H  . latanoprost  1 drop Both Eyes QHS  . magnesium oxide  400 mg Oral BID  . methylPREDNISolone (SOLU-MEDROL) injection  40 mg Intravenous Daily  . multivitamin with minerals  1 tablet Oral Daily  . pantoprazole (PROTONIX) IV  40 mg Intravenous Q12H  . senna-docusate  1 tablet Oral Daily  . tacrolimus  0.5 mg Oral QHS  . tacrolimus  1 mg Oral Daily  . valACYclovir  500 mg Oral BID  . [START ON 05/17/2020] verapamil  180 mg Oral Daily  . vitamin B-12  1,000 mcg Oral Daily   Continuous Infusions: . ceFEPime (MAXIPIME) IV 2 g (05/16/20 1149)  . lactated ringers Stopped (05/16/20 1132)   PRN Meds:.acetaminophen **OR** [DISCONTINUED] acetaminophen, levalbuterol, [DISCONTINUED] ondansetron **OR** ondansetron (ZOFRAN) IV   SUBJECTIVE: She is feeling much better.  She is no longer short of breath.  Review of Systems: Review of Systems  Constitutional: Positive for malaise/fatigue. Negative for chills, diaphoresis and fever.  HENT: Negative for congestion and sore throat.   Respiratory: Negative for cough, sputum production and shortness of breath.   Cardiovascular: Negative for chest pain.  Genitourinary: Negative for dysuria.  Allergies  Allergen Reactions  . Infed [Iron Dextran] Other (See Comments)    Chest tightness  . Pentamidine Itching, Shortness Of Breath and Swelling  . Erythromycin [Erythromycin] Other (See Comments)    Mouth Ulcers  . Iohexol Other (See Comments)    Per patient "she has had a kidney transplant and should never have contrast"  . Oxycodone Nausea Only and Nausea And Vomiting  . Erythromycin Rash    Causes breakout in mouth  . Ultram [Tramadol Hcl] Anxiety    OBJECTIVE: Vitals:   05/16/20 0400 05/16/20 0819 05/16/20 0831 05/16/20 1200  BP: (!) 99/58  129/67 132/64  Pulse: 83   94  Resp: 17   (!) 22  Temp: 97.9 F (36.6 C) 97.8 F (36.6 C)  98.1 F (36.7 C)  TempSrc:  Oral Oral  Oral  SpO2: 94%   94%   There is no height or weight on file to calculate BMI.  Physical Exam Constitutional:      Comments: She is resting quietly in bed.  She is in good spirits.  Cardiovascular:     Rate and Rhythm: Normal rate and regular rhythm.     Heart sounds: No murmur heard.   Pulmonary:     Effort: Pulmonary effort is normal.     Breath sounds: Normal breath sounds.  Abdominal:     General: There is no distension.     Palpations: Abdomen is soft.     Comments: No tenderness over her transplant in her right lower quadrant.  Musculoskeletal:        General: No swelling or tenderness.  Skin:    Findings: No rash.  Psychiatric:        Mood and Affect: Mood normal.     Lab Results Lab Results  Component Value Date   WBC 8.4 05/16/2020   HGB 7.9 (L) 05/16/2020   HCT 26.3 (L) 05/16/2020   MCV 102.7 (H) 05/16/2020   PLT 144 (L) 05/16/2020    Lab Results  Component Value Date   CREATININE 1.35 (H) 05/16/2020   BUN 69 (H) 05/16/2020   NA 135 05/16/2020   K 5.1 05/16/2020   CL 104 05/16/2020   CO2 21 (L) 05/16/2020    Lab Results  Component Value Date   ALT 14 05/16/2020   AST 16 05/16/2020   ALKPHOS 61 05/16/2020   BILITOT 0.8 05/16/2020     Microbiology: Recent Results (from the past 240 hour(s))  Blood Culture (routine x 2)     Status: None (Preliminary result)   Collection Time: 05/13/20 10:58 AM   Specimen: BLOOD LEFT HAND  Result Value Ref Range Status   Specimen Description BLOOD LEFT HAND  Final   Special Requests   Final    BOTTLES DRAWN AEROBIC AND ANAEROBIC Blood Culture adequate volume   Culture   Final    NO GROWTH 3 DAYS Performed at [facility], [address] [place], [place] [address]    Report Status PENDING  Incomplete  Blood Culture (routine x 2)     Status: None (Preliminary result)   Collection Time: 05/13/20 11:48 AM   Specimen: BLOOD  Result Value Ref Range Status   Specimen Description BLOOD RIGHT  ANTECUBITAL  Final   Special Requests   Final    BOTTLES DRAWN AEROBIC AND ANAEROBIC Blood Culture adequate volume   Culture   Final    NO GROWTH 3 DAYS Performed at [facility], [place] [address]  [place], [place] [address]    Report Status PENDING  Incomplete  SARS Coronavirus 2 by RT PCR (hospital order, performed in [facility] hospital lab) Nasopharyngeal Nasopharyngeal Swab     Status: None   Collection Time: 05/13/20  1:53 PM   Specimen: Nasopharyngeal Swab  Result Value Ref Range Status   SARS Coronavirus 2 NEGATIVE NEGATIVE Final    Comment: (NOTE) SARS-CoV-2 target nucleic acids are NOT DETECTED.  The SARS-CoV-2 RNA is generally detectable in upper and lower respiratory specimens during the acute phase of infection. The lowest concentration of SARS-CoV-2 viral copies this assay can detect is 250 copies / mL. A negative result does not preclude SARS-CoV-2 infection and should not be used as the sole basis for treatment or other patient management decisions.  A negative result may occur with improper specimen collection / handling, submission of specimen other than nasopharyngeal swab, presence of viral mutation(s) within the areas targeted by this assay, and inadequate number of viral copies (<250 copies / mL). A negative result must be combined with clinical observations, patient history, and epidemiological information.  Fact Sheet for Patients:   StrictlyIdeas.no  Fact Sheet for Healthcare Providers: BankingDealers.co.za  This test is not yet approved or  cleared by the [place] FDA and has been authorized for detection and/or diagnosis of SARS-CoV-2 by FDA under an Emergency Use Authorization (EUA).  This EUA will remain in effect (meaning this test can be used) for the duration of the COVID-19 declaration under Section 564(b)(1) of the Act, 21 U.S.C. section 360bbb-3(b)(1), unless the authorization is  terminated or revoked sooner.  Performed at [facility], [place] [address], [place], [place] [address]   Urine culture     Status: None   Collection Time: 05/13/20  3:12 PM   Specimen: In/Out Cath Urine  Result Value Ref Range Status   Specimen Description IN/OUT CATH URINE  Final   Special Requests NONE  Final   Culture   Final    NO GROWTH Performed at [facility], [place] [address] [place], [place] [address]    Report Status 05/14/2020 FINAL  Final  MRSA PCR Screening     Status: None   Collection Time: 05/14/20  4:46 AM   Specimen: Nasal Mucosa; Nasopharyngeal  Result Value Ref Range Status   MRSA by PCR NEGATIVE NEGATIVE Final    Comment:        The GeneXpert MRSA Assay (FDA approved for NASAL specimens only), is one component of a comprehensive MRSA colonization surveillance program. It is not intended to diagnose MRSA infection nor to guide or monitor treatment for MRSA infections. Performed at [facility], [place] [address], [place],  [address]     Michel Bickers, [place] [facility] [phone] pager   [phone] cell 05/16/2020, 12:29 PM

## 2020-05-17 DIAGNOSIS — R509 Fever, unspecified: Secondary | ICD-10-CM

## 2020-05-17 LAB — COMPREHENSIVE METABOLIC PANEL WITH GFR
ALT: 14 U/L (ref 0–44)
AST: 17 U/L (ref 15–41)
Albumin: 2.4 g/dL — ABNORMAL LOW (ref 3.5–5.0)
Alkaline Phosphatase: 61 U/L (ref 38–126)
Anion gap: 10 (ref 5–15)
BUN: 77 mg/dL — ABNORMAL HIGH (ref 8–23)
CO2: 26 mmol/L (ref 22–32)
Calcium: 10.1 mg/dL (ref 8.9–10.3)
Chloride: 104 mmol/L (ref 98–111)
Creatinine, Ser: 1.25 mg/dL — ABNORMAL HIGH (ref 0.44–1.00)
GFR calc Af Amer: 53 mL/min — ABNORMAL LOW
GFR calc non Af Amer: 46 mL/min — ABNORMAL LOW
Glucose, Bld: 120 mg/dL — ABNORMAL HIGH (ref 70–99)
Potassium: 4.4 mmol/L (ref 3.5–5.1)
Sodium: 140 mmol/L (ref 135–145)
Total Bilirubin: 0.9 mg/dL (ref 0.3–1.2)
Total Protein: 6.8 g/dL (ref 6.5–8.1)

## 2020-05-17 LAB — CBC WITH DIFFERENTIAL/PLATELET
Abs Immature Granulocytes: 0.04 10*3/uL (ref 0.00–0.07)
Basophils Absolute: 0 10*3/uL (ref 0.0–0.1)
Basophils Relative: 0 %
Eosinophils Absolute: 0 10*3/uL (ref 0.0–0.5)
Eosinophils Relative: 0 %
HCT: 29.2 % — ABNORMAL LOW (ref 36.0–46.0)
Hemoglobin: 8.5 g/dL — ABNORMAL LOW (ref 12.0–15.0)
Immature Granulocytes: 1 %
Lymphocytes Relative: 11 %
Lymphs Abs: 0.7 10*3/uL (ref 0.7–4.0)
MCH: 30.1 pg (ref 26.0–34.0)
MCHC: 29.1 g/dL — ABNORMAL LOW (ref 30.0–36.0)
MCV: 103.5 fL — ABNORMAL HIGH (ref 80.0–100.0)
Monocytes Absolute: 0.3 10*3/uL (ref 0.1–1.0)
Monocytes Relative: 4 %
Neutro Abs: 5 10*3/uL (ref 1.7–7.7)
Neutrophils Relative %: 84 %
Platelets: 139 10*3/uL — ABNORMAL LOW (ref 150–400)
RBC: 2.82 MIL/uL — ABNORMAL LOW (ref 3.87–5.11)
RDW: 20.9 % — ABNORMAL HIGH (ref 11.5–15.5)
WBC: 6 10*3/uL (ref 4.0–10.5)
nRBC: 0 % (ref 0.0–0.2)

## 2020-05-17 LAB — MAGNESIUM: Magnesium: 2.6 mg/dL — ABNORMAL HIGH (ref 1.7–2.4)

## 2020-05-17 LAB — GLUCOSE, CAPILLARY
Glucose-Capillary: 105 mg/dL — ABNORMAL HIGH (ref 70–99)
Glucose-Capillary: 202 mg/dL — ABNORMAL HIGH (ref 70–99)
Glucose-Capillary: 197 mg/dL — ABNORMAL HIGH (ref 70–99)
Glucose-Capillary: 220 mg/dL — ABNORMAL HIGH (ref 70–99)

## 2020-05-17 LAB — OCCULT BLOOD X 1 CARD TO LAB, STOOL: Fecal Occult Bld: NEGATIVE

## 2020-05-17 LAB — BRAIN NATRIURETIC PEPTIDE: B Natriuretic Peptide: 886.6 pg/mL — ABNORMAL HIGH (ref 0.0–100.0)

## 2020-05-17 LAB — C-REACTIVE PROTEIN: CRP: 2.6 mg/dL — ABNORMAL HIGH (ref <1.0)

## 2020-05-17 LAB — CMV DNA, QUANTITATIVE, PCR
CMV DNA Quant: NEGATIVE IU/mL
Log10 CMV Qn DNA Pl: UNDETERMINED log10 IU/mL

## 2020-05-17 LAB — D-DIMER, QUANTITATIVE: D-Dimer, Quant: 1.03 ug{FEU}/mL — ABNORMAL HIGH (ref 0.00–0.50)

## 2020-05-17 LAB — PROCALCITONIN: Procalcitonin: 2.01 ng/mL

## 2020-05-17 MED ORDER — FUROSEMIDE 80 MG PO TABS
80.0000 mg | ORAL_TABLET | Freq: Once | ORAL | Status: AC
  Administered 2020-05-17: 80 mg via ORAL
  Filled 2020-05-17: qty 1

## 2020-05-17 MED ORDER — ISOSORBIDE MONONITRATE ER 60 MG PO TB24
60.0000 mg | ORAL_TABLET | Freq: Every day | ORAL | Status: DC
  Administered 2020-05-17 – 2020-05-22 (×6): 60 mg via ORAL
  Filled 2020-05-17 (×6): qty 1

## 2020-05-17 MED ORDER — FUROSEMIDE 40 MG PO TABS: 40.0000 mg | ORAL_TABLET | Freq: Once | ORAL | Status: DC

## 2020-05-17 NOTE — Progress Notes (Signed)
PROGRESS NOTE                                                                                                                                                                                                             Patient Demographics:    April [name], is a 64 y.o. female, DOB - 08-28-1956, WJX:[id number]  Admit date - 05/13/2020   Admitting Physician Lequita Halt, MD  Outpatient Primary MD for the patient is [name], MD  LOS - 4  Chief Complaint  Patient presents with  . Shortness of Breath  . Chest Pain       Brief Narrative - April [name] is a 64 y.o. female nursing home resident at [facility] with medical history significant of polycystic kidney disease s/p renal transplant (on prednisone, mycophenalate, prograf), COPD on 2L [place] baseline, diastolic CHF, chronic hypoxic resp failure, obesity, HTN, pulmonary HTN, recurrent UTI, presented to ED withSOB wheezingand fever with no clear source, she has recently been admitted 3-4 times in the last few weeks for similar problems.  She states that about 6 months ago she had a mechanical fall with left-sided hip fracture and since then she has been able to go home and has been in a nursing home.  Her main complaint was gradually progressive shortness of breath, fatigue and low-grade fevers.   Subjective:   Patient in bed, appears comfortable, denies any headache, no fever, no chest pain or pressure, no shortness of breath , no abdominal pain. No focal weakness.   Assessment  & Plan :     1.  Sepsis with leukocytosis, elevated procalcitonin and CRP.  In a patient with kidney transplant, on chronic immunosuppressive therapy with no clear-cut source.  At this time she has had multiple similar admissions in the recent past and no convincing source has been found, blood cultures are pending, she was initially on IV vancomycin and cefepime transition to cefepime only on 05/15/2020.  Recent echo from few weeks ago  reviewed without any hint of vegetation, CT chest abdomen pelvis remains unrevealing, unremarkable V/Q and leg venous ultrasound, MRI of the sacrum does not show any abscess or osteomyelitis around the sacral wound,-ve CMV titers.  Sepsis pathophysiology has resolved, continue supportive care with gentle hydration with IV fluids.  ID following currently on cefepime with negative cultures so far.   Recent Labs  Lab 05/13/20 1044 05/13/20 1104 05/13/20 1603 05/14/20 0243 05/14/20 0827 05/15/20 0348 05/16/20 0350 05/17/20 1021  WBC 18.8*  --   --  18.7*  --  8.3 8.4 6.0  PLT 236  --   --  205  --  149* 144* 139*  CRP  --   --   --   --  27.0* 17.7* 8.5*  --   DDIMER  --   --   --   --  2.75* 1.47* 1.00* 1.03*  PROCALCITON  --   --  3.84 6.01  --  3.96 4.17  --   LATICACIDVEN  --  0.9  --   --  1.1  --   --   --     2.  History of renal transplant in 2013 due to underlying polycystic kidney disease.  Case discussed with nephrologist Dr. Vanetta Mulders on 05/14/2020, okay for IV contrast with hydration, IV fluids continued, discontinue Myfortic as suggested by nephrology, continue other home medications with higher dose IV steroids, gradually taper to chronic home dose oral steroid.  3.  History of SVT. verapamil dose adjusted further.  4.  History of pulmonary hypertension.  Supportive care.  5.  Chronic grade 2 diastolic dysfunction with EF 55%.  Currently compensated clinically, BNP elevated likely due to pulmonary hypertension.  She has no oxygen demand and no crackles on exam at this time she is compensated.  6.  Chronic iron deficiency anemia with mixed picture with low iron reserves but elevated MCV.  Oral iron supplementation, no source of ongoing active bleeding however H&H dropped further on 05/15/2020 due to heme dilution, 1 unit packed RBC on 05/15/2020.  Since she has had a recurrent drop in H&H even previous admissions, case discussed with GI physician Dr. Benson Norway on 05/17/2020,  plan is outpatient work-up.  7.  Trace leg edema due to malnutrition.  Protein supplements.  Resumed Lasix.  8.  Borderline elevated D-dimer.  Due to inflammation improved after hydration, on prophylactic dose Lovenox, VQ scan and leg venous ultrasound negative.  9.  Hyponatremia.  Was due to dehydration improved after IV fluids.   10.  AKI.  Likely due to sepsis and hypotension, continue to hydrate and avoid nephrotoxins, monitor closely.   11.  Urinary retention.  Foley placed on 05/14/2020.   12. Small unstageable sacrococcygeal decubitus ulcer which is chronic.  Wound care following.  Appears noninfected.  See wound care note for details.  MRI being done to rule out any deep-seated infection.  Pressure Injury 04/29/20 Coccyx Mid Stage 3 -  Full thickness tissue loss. Subcutaneous fat may be visible but bone, tendon or muscle are NOT exposed. (Active)  04/29/20 2130  Location: Coccyx  Location Orientation: Mid  Staging: Stage 3 -  Full thickness tissue loss. Subcutaneous fat may be visible but bone, tendon or muscle are NOT exposed.  Wound Description (Comments):   Present on Admission: Yes      Condition - Extremely Guarded  Family Communication  :   Left for husband Edison Nasuti [phone] on 05/14/2020 at 13:14 PM  Daughter [name] [phone] on 02/10/2020 at 11:36 AM.   Code Status :  Full  Consults  :  PCCM, Renal Dr. Moshe Cipro over the phone, ID, GI  Procedures  :    MRI - pelvis-  VQ - NML  CT chest abdomen pelvis - 1. No acute intra-abdominal or pelvic pathology. 2. Polycystic kidney disease with a right lower quadrant renal transplant. No hydronephrosis or nephrolithiasis. 3. No bowel obstruction. Normal appendix. 4. Small bilateral pleural effusions with associated bibasilar atelectasis. Please see report for chest CT. 5. Aortic Atherosclerosis (  ICD10-I70.0).  No acute findings in the chest except changes suggestive of pulmonary hypertension.  Mild bilateral  pleural effusions with compressive atelectasis.   Bilateral leg venous ultrasound.  No DVT.     TTE. 04/17/20 -  1. Left ventricular ejection fraction, by estimation, is 55 to 60%. The left ventricle has normal function. The left ventricle has no regional wall motion abnormalities. Left ventricular diastolic parameters are consistent with Grade II diastolic dysfunction (pseudonormalization). Elevated left atrial pressure.  2. Right ventricular systolic function is normal. The right ventricular size is normal. There is mildly elevated pulmonary artery systolic pressure. The estimated right ventricular systolic pressure is 42.6 mmHg.  3. Left atrial size was mildly dilated.  4. The mitral valve is degenerative. Trivial mitral valve regurgitation. No evidence of mitral stenosis.  5. The aortic valve is tricuspid. Aortic valve regurgitation is not visualized. No aortic stenosis is present.  6. The inferior vena cava is dilated in size with <50% respiratory variability, suggesting right atrial pressure of 15 mmHg. Comparison(s): No significant change from prior study.   PUD Prophylaxis : PPI  Disposition Plan  :    Status is: Inpatient  Remains inpatient appropriate because:IV treatments appropriate due to intensity of illness or inability to take PO   Dispo: The patient is from: SNF              Anticipated d/c is to: SNF              Anticipated d/c date is: > 3 days              Patient currently is not medically stable to d/c.   DVT Prophylaxis  :  Lovenox    Lab Results  Component Value Date   PLT 139 (L) 05/17/2020    Diet :  Diet Order            Diet Heart Room service appropriate? No; Fluid consistency: Thin  Diet effective now                  Inpatient Medications Scheduled Meds: . allopurinol  300 mg Oral Daily  . ascorbic acid  500 mg Oral Daily  . [START ON 05/19/2020] aspirin  81 mg Oral Daily  . brimonidine  1 drop Both Eyes BID  . Chlorhexidine Gluconate  Cloth  6 each Topical Daily  . cholecalciferol  1,000 Units Oral Daily  . collagenase  1 application Topical Daily  . cycloSPORINE  1 drop Both Eyes BID  . docusate sodium  100 mg Oral BID  . dorzolamide  1 drop Both Eyes Daily  . enoxaparin (LOVENOX) injection  40 mg Subcutaneous Q24H  . feeding supplement (ENSURE ENLIVE)  237 mL Oral BID BM  . feeding supplement (PRO-STAT SUGAR FREE 64)  30 mL Oral BID WC  . ferrous sulfate  325 mg Oral BID WC  . FLUoxetine  40 mg Oral Daily  . furosemide  40 mg Oral Once  . gabapentin  100 mg Oral TID  . guaiFENesin  600 mg Oral BID  . insulin aspart  0-9 Units Subcutaneous TID WC  . ipratropium-albuterol  3 mL Nebulization TID  . latanoprost  1 drop Both Eyes QHS  . magnesium oxide  400 mg Oral BID  . methylPREDNISolone (SOLU-MEDROL) injection  40 mg Intravenous Daily  . multivitamin with minerals  1 tablet Oral Daily  . pantoprazole (PROTONIX) IV  40 mg Intravenous Q12H  . senna-docusate  1 tablet  Oral Daily  . tacrolimus  0.5 mg Oral QHS  . tacrolimus  1 mg Oral Daily  . valACYclovir  500 mg Oral BID  . verapamil  180 mg Oral Daily  . vitamin B-12  1,000 mcg Oral Daily   Continuous Infusions: . ceFEPime (MAXIPIME) IV 2 g (05/17/20 1134)   PRN Meds:.acetaminophen **OR** [DISCONTINUED] acetaminophen, levalbuterol, [DISCONTINUED] ondansetron **OR** ondansetron (ZOFRAN) IV  Antibiotics  :   Anti-infectives (From admission, onward)   Start     Dose/Rate Route Frequency Ordered Stop   05/14/20 0200  vancomycin (VANCOREADY) IVPB 750 mg/150 mL  Status:  Discontinued        750 mg 150 mL/hr over 60 Minutes Intravenous Every 12 hours 05/13/20 1239 05/15/20 1522   05/14/20 0000  ceFEPIme (MAXIPIME) 2 g in sodium chloride 0.9 % 100 mL IVPB     Discontinue     2 g 200 mL/hr over 30 Minutes Intravenous Every 12 hours 05/13/20 1239 05/17/20 1200   05/13/20 1500  valACYclovir (VALTREX) tablet 500 mg     Discontinue     500 mg Oral 2 times daily  05/13/20 1420     05/13/20 1115  ceFEPIme (MAXIPIME) 2 g in sodium chloride 0.9 % 100 mL IVPB        2 g 200 mL/hr over 30 Minutes Intravenous  Once 05/13/20 1105 05/13/20 1310   05/13/20 1115  metroNIDAZOLE (FLAGYL) IVPB 500 mg        500 mg 100 mL/hr over 60 Minutes Intravenous  Once 05/13/20 1105 05/13/20 1311   05/13/20 1115  vancomycin (VANCOCIN) IVPB 1000 mg/200 mL premix  Status:  Discontinued        1,000 mg 200 mL/hr over 60 Minutes Intravenous  Once 05/13/20 1105 05/13/20 1107   05/13/20 1115  vancomycin (VANCOREADY) IVPB 1500 mg/300 mL        1,500 mg 150 mL/hr over 120 Minutes Intravenous  Once 05/13/20 1107 05/13/20 1612          Objective:   Vitals:   05/17/20 0747 05/17/20 0800 05/17/20 0830 05/17/20 0946  BP: (!) 119/55   140/60  Pulse: 85  81   Resp: (!) 22  (!) 22   Temp:  (!) 97.3 F (36.3 C)    TempSrc:  Rectal    SpO2: 100%  98%   Weight:      Height:        SpO2: 98 % O2 Flow Rate (L/min): 1 L/min FiO2 (%): 40 %  Wt Readings from Last 3 Encounters:  05/16/20 74.6 kg  05/02/20 74.4 kg  04/19/20 73.2 kg     Intake/Output Summary (Last 24 hours) at 05/17/2020 1153 Last data filed at 05/17/2020 0124 Gross per 24 hour  Intake 200 ml  Output 1850 ml  Net -1650 ml     Physical Exam  Awake Alert, No new F.N deficits, Normal affect [address].AT,PERRAL Supple Neck,No JVD, No cervical lymphadenopathy appriciated.  Symmetrical Chest wall movement, Good air movement bilaterally, CTAB RRR,No Gallops, Rubs or new Murmurs, No Parasternal Heave +ve B.Sounds, Abd Soft, No tenderness, No organomegaly appriciated, No rebound - guarding or rigidity. No Cyanosis, Clubbing or edema, No new Rash or bruise     Pressure Injury 04/29/20 Coccyx Mid Stage 3 -  Full thickness tissue loss. Subcutaneous fat may be visible but bone, tendon or muscle are NOT exposed. (Active)  04/29/20 2130  Location: Coccyx  Location Orientation: Mid  Staging: Stage 3 -  Full  thickness tissue loss. Subcutaneous fat may be visible but bone, tendon or muscle are NOT exposed.  Wound Description (Comments):   Present on Admission: Yes     Data Review:    Recent Labs  Lab 05/13/20 1044 05/13/20 1129 05/14/20 0243 05/15/20 0348 05/15/20 1138 05/16/20 0350 05/17/20 1021  WBC 18.8*  --  18.7* 8.3  --  8.4 6.0  HGB 9.5*   < > 8.5* 6.7* 8.8* 7.9* 8.5*  HCT 32.4*   < > 28.8* 22.5* 29.0* 26.3* 29.2*  PLT 236  --  205 149*  --  144* 139*  MCV 103.8*  --  102.9* 104.2*  --  102.7* 103.5*  MCH 30.4  --  30.4 31.0  --  30.9 30.1  MCHC 29.3*  --  29.5* 29.8*  --  30.0 29.1*  RDW 23.3*  --  23.3* 22.5*  --  21.8* 20.9*  LYMPHSABS 1.2  --   --  0.3*  --  0.3* 0.7  MONOABS 0.6  --   --  0.1  --  0.1 0.3  EOSABS 0.1  --   --  0.0  --  0.0 0.0  BASOSABS 0.0  --   --  0.0  --  0.0 0.0   < > = values in this interval not displayed.    Recent Labs  Lab 05/13/20 1044 05/13/20 1044 05/13/20 1129 05/13/20 1148 05/13/20 1603 05/14/20 0243 05/14/20 0827 05/15/20 0348 05/16/20 0350 05/17/20 1021  NA 136   < > 139  --   --  136  --  128* 135 140  K 4.3   < > 4.1  --   --  3.8  --  3.4* 5.1 4.4  CL 101  --   --   --   --  102  --  95* 104 104  CO2 25  --   --   --   --  24  --  19* 21* 26  GLUCOSE 129*  --   --   --   --  94  --  216* 256* 120*  BUN 22  --   --   --   --  30*  --  45* 69* 77*  CREATININE 0.90  --   --   --   --  1.06*  --  0.93 1.35* 1.25*  CALCIUM 9.6  --   --   --   --  9.3  --  7.7* 9.6 10.1  AST 22  --   --   --   --   --   --  14* 16 17  ALT 18  --   --   --   --   --   --  14 14 14   ALKPHOS 94  --   --   --   --   --   --  54 61 61  BILITOT 1.3*  --   --   --   --   --   --  3.9* 0.8 0.9  ALBUMIN 2.8*  --   --   --   --   --   --  1.8* 2.2* 2.4*  MG  --   --   --   --   --   --  1.5* 2.0 2.5* 2.6*  CRP  --   --   --   --   --   --  27.0* 17.7* 8.5*  --   DDIMER  --   --   --   --   --   --  2.75* 1.47* 1.00* 1.03*  PROCALCITON  --   --    --   --  3.84 6.01  --  3.96 4.17  --   INR  --   --   --  1.2  --   --   --   --   --   --   BNP  --   --   --   --  1,176.0*  --  468.5* 534.1* 608.9*  --    < > = values in this interval not displayed.    Recent Labs  Lab 05/13/20 1353 05/13/20 1603 05/14/20 0243 05/14/20 0827 05/15/20 0348 05/16/20 0350 05/17/20 1021  CRP  --   --   --  27.0* 17.7* 8.5*  --   DDIMER  --   --   --  2.75* 1.47* 1.00* 1.03*  BNP  --  1,176.0*  --  468.5* 534.1* 608.9*  --   PROCALCITON  --  3.84 6.01  --  3.96 4.17  --   SARSCOV2NAA NEGATIVE  --   --   --   --   --   --     ------------------------------------------------------------------------------------------------------------------ No results for input(s): CHOL, HDL, LDLCALC, TRIG, CHOLHDL, LDLDIRECT in the last 72 hours.  Lab Results  Component Value Date   HGBA1C 5.9 (H) 04/29/2020   ------------------------------------------------------------------------------------------------------------------ No results for input(s): TSH, T4TOTAL, T3FREE, THYROIDAB in the last 72 hours.  Invalid input(s): FREET3 ------------------------------------------------------------------------------------------------------------------ No results for input(s): VITAMINB12, FOLATE, FERRITIN, TIBC, IRON, RETICCTPCT in the last 72 hours.  Coagulation profile Recent Labs  Lab 05/13/20 1148  INR 1.2    Recent Labs    05/16/20 0350 05/17/20 1021  DDIMER 1.00* 1.03*    Cardiac Enzymes No results for input(s): CKMB, TROPONINI, MYOGLOBIN in the last 168 hours.  Invalid input(s): CK ------------------------------------------------------------------------------------------------------------------    Component Value Date/Time   BNP 608.9 (H) 05/16/2020 0350    Micro Results Recent Results (from the past 240 hour(s))  Blood Culture (routine x 2)     Status: None (Preliminary result)   Collection Time: 05/13/20 10:58 AM   Specimen: BLOOD LEFT HAND   Result Value Ref Range Status   Specimen Description BLOOD LEFT HAND  Final   Special Requests   Final    BOTTLES DRAWN AEROBIC AND ANAEROBIC Blood Culture adequate volume   Culture   Final    NO GROWTH 3 DAYS Performed at [facility], [address], [place], [place] [address]    Report Status PENDING  Incomplete  Blood Culture (routine x 2)     Status: None (Preliminary result)   Collection Time: 05/13/20 11:48 AM   Specimen: BLOOD  Result Value Ref Range Status   Specimen Description BLOOD RIGHT ANTECUBITAL  Final   Special Requests   Final    BOTTLES DRAWN AEROBIC AND ANAEROBIC Blood Culture adequate volume   Culture   Final    NO GROWTH 3 DAYS Performed at [facility], [place] [address], [place], [place] [address]    Report Status PENDING  Incomplete  SARS Coronavirus 2 by RT PCR (hospital order, performed in [facility] hospital lab) Nasopharyngeal Nasopharyngeal Swab     Status: None   Collection Time: 05/13/20  1:53 PM   Specimen: Nasopharyngeal Swab  Result Value Ref Range Status   SARS Coronavirus 2 NEGATIVE NEGATIVE Final    Comment: (NOTE) SARS-CoV-2 target nucleic acids are NOT DETECTED.  The SARS-CoV-2 RNA is generally detectable  in upper and lower respiratory specimens during the acute phase of infection. The lowest concentration of SARS-CoV-2 viral copies this assay can detect is 250 copies / mL. A negative result does not preclude SARS-CoV-2 infection and should not be used as the sole basis for treatment or other patient management decisions.  A negative result may occur with improper specimen collection / handling, submission of specimen other than nasopharyngeal swab, presence of viral mutation(s) within the areas targeted by this assay, and inadequate number of viral copies (<250 copies / mL). A negative result must be combined with clinical observations, patient history, and epidemiological information.  Fact Sheet for Patients:    StrictlyIdeas.no  Fact Sheet for Healthcare Providers: BankingDealers.co.za  This test is not yet approved or  cleared by the [place] FDA and has been authorized for detection and/or diagnosis of SARS-CoV-2 by FDA under an Emergency Use Authorization (EUA).  This EUA will remain in effect (meaning this test can be used) for the duration of the COVID-19 declaration under Section 564(b)(1) of the Act, 21 U.S.C. section 360bbb-3(b)(1), unless the authorization is terminated or revoked sooner.  Performed at [facility], [place] [address] [place], [place] [address]   Urine culture     Status: None   Collection Time: 05/13/20  3:12 PM   Specimen: In/Out Cath Urine  Result Value Ref Range Status   Specimen Description IN/OUT CATH URINE  Final   Special Requests NONE  Final   Culture   Final    NO GROWTH Performed at [facility], [place] [address], [place], [place] [address]    Report Status 05/14/2020 FINAL  Final  MRSA PCR Screening     Status: None   Collection Time: 05/14/20  4:46 AM   Specimen: Nasal Mucosa; Nasopharyngeal  Result Value Ref Range Status   MRSA by PCR NEGATIVE NEGATIVE Final    Comment:        The GeneXpert MRSA Assay (FDA approved for NASAL specimens only), is one component of a comprehensive MRSA colonization surveillance program. It is not intended to diagnose MRSA infection nor to guide or monitor treatment for MRSA infections. Performed at [facility], [place] [address], [place], [place] [address]     Radiology Reports CT CHEST WO CONTRAST  Result Date: 05/14/2020 CLINICAL DATA:  64 year old female with shortness of breath and chest pain. EXAM: CT CHEST WITHOUT CONTRAST TECHNIQUE: Multidetector CT imaging of the chest was performed following the standard protocol without IV contrast. COMPARISON:  Chest radiograph dated 04/29/2020. FINDINGS: Evaluation of this exam is limited in the  absence of intravenous contrast. Cardiovascular: There is mild cardiomegaly. No pericardial effusion. Coronary vascular calcification. There is mild atherosclerotic calcification of the thoracic aorta. There is mild dilatation of the main pulmonary trunk suggestive of pulmonary hypertension. Clinical correlation is recommended. Evaluation of the vasculature is limited in the absence of intravenous contrast. Mediastinum/Nodes: No hilar or mediastinal adenopathy. The esophagus and the thyroid gland are grossly unremarkable. No mediastinal fluid collection. Lungs/Pleura: Small bilateral pleural effusions similar to prior CT with associated partial compressive atelectasis of the lower lobes. Pneumonia is not excluded. Clinical correlation is recommended. There are bibasilar linear atelectasis/scarring. No pneumothorax. The central airways are patent. Upper Abdomen: Please see the report for the accompanying CT abdomen pelvis. Musculoskeletal: No chest wall mass or suspicious bone lesions identified. IMPRESSION: 1. Small bilateral pleural effusions with associated partial compressive atelectasis of the lower lobes. Pneumonia is not excluded. Clinical  correlation is recommended. 2. Mild cardiomegaly with coronary vascular calcification. 3. Mildly dilated main pulmonary trunk suggestive of pulmonary hypertension. Clinical correlation is recommended. 4. Aortic Atherosclerosis (ICD10-I70.0). Electronically Signed   By: Anner Crete M.D.   On: 05/14/2020 21:51   CT Chest Wo Contrast  Result Date: 04/29/2020 CLINICAL DATA:  Pneumonia. Pleural effusion or abscess suspected. Follow-up to current chest radiograph. EXAM: CT CHEST WITHOUT CONTRAST TECHNIQUE: Multidetector CT imaging of the chest was performed following the standard protocol without IV contrast. COMPARISON:  Current chest radiograph.  CT chest dated 04/15/2020. FINDINGS: Cardiovascular: Heart is mildly enlarged. No pericardial effusion. Minimal left coronary  artery calcifications. Aorta is normal in caliber. Minor atherosclerotic calcifications along the descending portion. Main pulmonary artery is dilated to 3.8 cm. Mediastinum/Nodes: No neck base, mediastinal or hilar masses or enlarged lymph nodes. Trachea and esophagus are unremarkable. Lungs/Pleura: Small right and trace left pleural effusions. Mild dependent atelectasis noted in both lower lobes and minimally at the diaphragmatic bases of the left upper lobe lingula right middle lobe and dependent upper lobes. Minor linear atelectasis in the left upper lobe. No convincing pneumonia and no evidence of pulmonary edema. No mass or suspicious nodule. No pneumothorax. Upper Abdomen: Multiple liver and bilateral renal cysts consistent with polycystic kidney disease. Enlarged spleen, 14.5 cm greatest transverse dimension. No acute findings in the visualized upper abdomen and no change from the prior CT. Musculoskeletal: Multiple old bilateral rib fractures. No acute fractures. No osteoblastic or osteolytic lesions. IMPRESSION: 1. Small right and trace left pleural effusions. There is associated atelectasis, but no convincing pneumonia and no evidence of pulmonary edema. No abscess. The lung appearance is similar to the previous chest CT. 2. Mild cardiomegaly. 3. Enlarged main pulmonary artery suggesting pulmonary artery hypertension. 4. Stable changes polycystic kidney disease including multiple liver cysts. Stable splenomegaly. Aortic Atherosclerosis (ICD10-I70.0). Electronically Signed   By: Lajean Manes M.D.   On: 04/29/2020 16:57   NM Pulmonary Perfusion  Result Date: 05/14/2020 CLINICAL DATA:  Hypoxemia with elevated D-dimer. EXAM: NUCLEAR MEDICINE PERFUSION LUNG SCAN TECHNIQUE: Perfusion images were obtained in multiple projections after intravenous injection of radiopharmaceutical. Ventilation scans intentionally deferred if perfusion scan and chest x-ray adequate for interpretation during COVID 19 epidemic.  RADIOPHARMACEUTICALS:  1.5 mCi Tc-1m MAA IV COMPARISON:  None. FINDINGS: There is normal homogeneous distribution of tracer activity seen throughout both lungs. No segmental or subsegmental perfusion defects are identified. IMPRESSION: Normal nuclear medicine perfusion lung scan. Electronically Signed   By: Virgina Norfolk M.D.   On: 05/14/2020 19:46   CT ABDOMEN PELVIS W CONTRAST  Result Date: 05/14/2020 CLINICAL DATA:  64 year old female with abdominal pain. EXAM: CT ABDOMEN AND PELVIS WITH CONTRAST TECHNIQUE: Multidetector CT imaging of the abdomen and pelvis was performed using the standard protocol following bolus administration of intravenous contrast. CONTRAST:  158mL OMNIPAQUE IOHEXOL 300 MG/ML  SOLN COMPARISON:  CT abdomen pelvis dated 04/15/2020. FINDINGS: Lower chest: Partially visualized small bilateral pleural effusions with associated bibasilar atelectasis. Please see report for chest CT. No intra-abdominal free air or free fluid. Hepatobiliary: Multiple hepatic hypodense lesions measure up to 3 cm. The larger lesions demonstrate fluid attenuation most consistent with cysts and smaller lesions are not well characterized. No calcified gallstone. There is a small pericholecystic fluid. Pancreas: Unremarkable. No pancreatic ductal dilatation or surrounding inflammatory changes. Spleen: Top-normal spleen size measuring up to 14 cm in craniocaudal length. Adrenals/Urinary Tract: The adrenal glands unremarkable. Innumerable bilateral renal cysts essentially replacing the renal parenchyma  consistent with polycystic kidney disease. There is no hydronephrosis on either side. There is a right lower quadrant renal transplant. There is no hydronephrosis or nephrolithiasis of the transplant kidney. No peritransplant fluid collection. The urinary bladder is decompressed around a Foley catheter. Stomach/Bowel: There is moderate stool throughout the colon. There is no bowel obstruction or active inflammation.  The appendix is normal. Vascular/Lymphatic: Mild aortoiliac atherosclerotic disease. The IVC is unremarkable. No portal venous gas. There is no adenopathy. Reproductive: The uterus is retroflexed. No adnexal masses. Other: Small fat containing umbilical hernia. Mild diffuse subcutaneous edema. Musculoskeletal: Osteopenia with degenerative changes of the spine. Total left hip arthroplasty. Old left pubic bone fracture as well as old sacral fractures. No acute osseous pathology. IMPRESSION: 1. No acute intra-abdominal or pelvic pathology. 2. Polycystic kidney disease with a right lower quadrant renal transplant. No hydronephrosis or nephrolithiasis. 3. No bowel obstruction. Normal appendix. 4. Small bilateral pleural effusions with associated bibasilar atelectasis. Please see report for chest CT. 5. Aortic Atherosclerosis (ICD10-I70.0). Electronically Signed   By: Anner Crete M.D.   On: 05/14/2020 21:25   MR SACRUM SI JOINTS WO CONTRAST  Result Date: 05/15/2020 CLINICAL DATA:  Chronic sacral wound with recurrent fevers. Evaluate for osteomyelitis. EXAM: MRI SACRUM WITHOUT CONTRAST TECHNIQUE: Multiplanar, multisequence MR imaging of the sacrum was performed. No intravenous contrast was administered. COMPARISON:  CT abdomen pelvis from yesterday. FINDINGS: Bones/Joint/Cartilage No suspicious marrow signal abnormality. No acute fracture or dislocation. Chronic bilateral sacral ala and S2 fractures. Prior left total hip arthroplasty with associated susceptibility artifact. Muscles and Tendons Bilateral piriformis, gluteal, and adductor muscle edema and atrophy. Soft tissue Left-sided sacrococcygeal ulcer. No fluid collection or hematoma. No soft tissue mass. Trace free fluid in the pelvis.  Fibroid uterus. IMPRESSION: 1. Left-sided sacrococcygeal ulcer without evidence of osteomyelitis or abscess. Electronically Signed   By: Titus Dubin M.D.   On: 05/15/2020 16:49   DG Chest Port 1 View  Result Date:  05/16/2020 CLINICAL DATA:  Shortness of breath. EXAM: PORTABLE CHEST 1 VIEW COMPARISON:  One-view chest x-ray 05/14/2020. CT chest without contrast 05/14/2020. FINDINGS: The a heart size is normal. Mild pulmonary vascular congestion is noted. Small right pleural effusion and atelectasis is again noted. No new airspace disease is present. IMPRESSION: 1. Stable appearance of small right pleural effusion and atelectasis. 2. Mild pulmonary vascular congestion. Electronically Signed   By: San Morelle M.D.   On: 05/16/2020 08:06   Portable chest 1 View  Result Date: 05/14/2020 CLINICAL DATA:  Shortness of breath EXAM: PORTABLE CHEST 1 VIEW COMPARISON:  04/23/2020 FINDINGS: Cardiac shadow is mildly prominent but stable accentuated by the portable technique. Overall inspiratory effort is again poor although previously seen atelectasis has improved. No focal confluent infiltrate or sizable effusion is noted. No bony abnormality is seen. IMPRESSION: No acute abnormality noted. Electronically Signed   By: Inez Catalina M.D.   On: 05/14/2020 09:28   DG Chest Portable 1 View  Result Date: 05/13/2020 CLINICAL DATA:  Short of breath chest pain EXAM: PORTABLE CHEST 1 VIEW COMPARISON:  04/29/2020 FINDINGS: Hypoventilation with decreased lung volume. Mild bibasilar atelectasis similar to the prior study Negative for heart failure or pneumonia. Small right pleural effusion. IMPRESSION: Hypoventilation with bibasilar atelectasis unchanged from the prior study Electronically Signed   By: Franchot Gallo M.D.   On: 05/13/2020 10:54   DG Chest Port 1 View  Result Date: 04/29/2020 CLINICAL DATA:  Shortness of breath EXAM: PORTABLE CHEST 1 VIEW COMPARISON:  Apr 16, 2020 FINDINGS: There is atelectatic change in the lower lung zones bilaterally, stable. No edema or airspace opacity. Heart is mildly enlarged. The pulmonary vascularity is within normal limits. No adenopathy. No bone lesions. IMPRESSION: Mild bibasilar  atelectasis. No edema or airspace opacity. Heart mildly enlarged with pulmonary vascularity within normal limits. No evident adenopathy. Electronically Signed   By: Lowella Grip III M.D.   On: 04/29/2020 14:39   VAS [place] LOWER EXTREMITY VENOUS (DVT)  Result Date: 05/13/2020  Lower Venous DVTStudy Indications: SOB, and sepsis.  Comparison Study: 12/16/19, 02/02/20, 03/16/20 all negative for DVT Performing Technologist: June Leap RDMS, RVT  Examination Guidelines: A complete evaluation includes B-mode imaging, spectral Doppler, color Doppler, and power Doppler as needed of all accessible portions of each vessel. Bilateral testing is considered an integral part of a complete examination. Limited examinations for reoccurring indications may be performed as noted. The reflux portion of the exam is performed with the patient in reverse Trendelenburg.  +---------+---------------+---------+-----------+----------+--------------+ RIGHT    CompressibilityPhasicitySpontaneityPropertiesThrombus Aging +---------+---------------+---------+-----------+----------+--------------+ CFV      Full           Yes      Yes                                 +---------+---------------+---------+-----------+----------+--------------+ SFJ      Full                                                        +---------+---------------+---------+-----------+----------+--------------+ FV Prox  Full                                                        +---------+---------------+---------+-----------+----------+--------------+ FV Mid   Full                                                        +---------+---------------+---------+-----------+----------+--------------+ FV DistalFull                                                        +---------+---------------+---------+-----------+----------+--------------+ PFV      Full                                                         +---------+---------------+---------+-----------+----------+--------------+ POP      Full           Yes      Yes                                 +---------+---------------+---------+-----------+----------+--------------+ PTV  Full                                                        +---------+---------------+---------+-----------+----------+--------------+ PERO     Full                                                        +---------+---------------+---------+-----------+----------+--------------+   +---------+---------------+---------+-----------+----------+--------------+ LEFT     CompressibilityPhasicitySpontaneityPropertiesThrombus Aging +---------+---------------+---------+-----------+----------+--------------+ CFV      Full           Yes      Yes                                 +---------+---------------+---------+-----------+----------+--------------+ SFJ      Full                                                        +---------+---------------+---------+-----------+----------+--------------+ FV Prox  Full                                                        +---------+---------------+---------+-----------+----------+--------------+ FV Mid   Full                                                        +---------+---------------+---------+-----------+----------+--------------+ FV DistalFull                                                        +---------+---------------+---------+-----------+----------+--------------+ PFV      Full                                                        +---------+---------------+---------+-----------+----------+--------------+ POP      Full           Yes      Yes                                 +---------+---------------+---------+-----------+----------+--------------+ PTV      Full                                                         +---------+---------------+---------+-----------+----------+--------------+  PERO     Full                                                        +---------+---------------+---------+-----------+----------+--------------+     Summary: BILATERAL: - No evidence of deep vein thrombosis seen in the lower extremities, bilaterally. -No evidence of popliteal cyst, bilaterally.   *See table(s) above for measurements and observations. Electronically signed by Ruta Hinds MD on 05/13/2020 at 3:35:07 PM.    Final     Time Spent in minutes  30   Lala Lund M.D on 05/17/2020 at 11:53 AM  To page go to [email or web address] - password [email or web address]

## 2020-05-18 LAB — CULTURE, BLOOD (ROUTINE X 2)
Culture: NO GROWTH
Culture: NO GROWTH
Special Requests: ADEQUATE
Special Requests: ADEQUATE

## 2020-05-18 LAB — COMPREHENSIVE METABOLIC PANEL
ALT: 18 U/L (ref 0–44)
AST: 22 U/L (ref 15–41)
Albumin: 2.4 g/dL — ABNORMAL LOW (ref 3.5–5.0)
Alkaline Phosphatase: 58 U/L (ref 38–126)
Anion gap: 8 (ref 5–15)
BUN: 76 mg/dL — ABNORMAL HIGH (ref 8–23)
CO2: 28 mmol/L (ref 22–32)
Calcium: 10 mg/dL (ref 8.9–10.3)
Chloride: 102 mmol/L (ref 98–111)
Creatinine, Ser: 1.13 mg/dL — ABNORMAL HIGH (ref 0.44–1.00)
GFR calc Af Amer: >60 mL/min (ref >60)
GFR calc non Af Amer: 52 mL/min — ABNORMAL LOW (ref >60)
Glucose, Bld: 121 mg/dL — ABNORMAL HIGH (ref 70–99)
Potassium: 4.5 mmol/L (ref 3.5–5.1)
Sodium: 138 mmol/L (ref 135–145)
Total Bilirubin: 0.8 mg/dL (ref 0.3–1.2)
Total Protein: 6.2 g/dL — ABNORMAL LOW (ref 6.5–8.1)

## 2020-05-18 LAB — GLUCOSE, CAPILLARY
Glucose-Capillary: 92 mg/dL (ref 70–99)
Glucose-Capillary: 218 mg/dL — ABNORMAL HIGH (ref 70–99)
Glucose-Capillary: 221 mg/dL — ABNORMAL HIGH (ref 70–99)
Glucose-Capillary: 123 mg/dL — ABNORMAL HIGH (ref 70–99)

## 2020-05-18 LAB — CBC WITH DIFFERENTIAL/PLATELET
Abs Immature Granulocytes: 0.05 10*3/uL (ref 0.00–0.07)
Basophils Absolute: 0 10*3/uL (ref 0.0–0.1)
Basophils Relative: 0 %
Eosinophils Absolute: 0 10*3/uL (ref 0.0–0.5)
Eosinophils Relative: 0 %
HCT: 27.5 % — ABNORMAL LOW (ref 36.0–46.0)
Hemoglobin: 8 g/dL — ABNORMAL LOW (ref 12.0–15.0)
Immature Granulocytes: 1 %
Lymphocytes Relative: 11 %
Lymphs Abs: 0.5 10*3/uL — ABNORMAL LOW (ref 0.7–4.0)
MCH: 29.7 pg (ref 26.0–34.0)
MCHC: 29.1 g/dL — ABNORMAL LOW (ref 30.0–36.0)
MCV: 102.2 fL — ABNORMAL HIGH (ref 80.0–100.0)
Monocytes Absolute: 0.3 10*3/uL (ref 0.1–1.0)
Monocytes Relative: 5 %
Neutro Abs: 4.1 10*3/uL (ref 1.7–7.7)
Neutrophils Relative %: 83 %
Platelets: 122 10*3/uL — ABNORMAL LOW (ref 150–400)
RBC: 2.69 MIL/uL — ABNORMAL LOW (ref 3.87–5.11)
RDW: 20 % — ABNORMAL HIGH (ref 11.5–15.5)
WBC: 5 10*3/uL (ref 4.0–10.5)
nRBC: 0 % (ref 0.0–0.2)

## 2020-05-18 LAB — C-REACTIVE PROTEIN: CRP: 1.4 mg/dL — ABNORMAL HIGH (ref <1.0)

## 2020-05-18 LAB — D-DIMER, QUANTITATIVE: D-Dimer, Quant: 0.68 ug/mL-FEU — ABNORMAL HIGH (ref 0.00–0.50)

## 2020-05-18 LAB — PROCALCITONIN: Procalcitonin: 1.12 ng/mL

## 2020-05-18 LAB — BRAIN NATRIURETIC PEPTIDE: B Natriuretic Peptide: 569.2 pg/mL — ABNORMAL HIGH (ref 0.0–100.0)

## 2020-05-18 LAB — MAGNESIUM: Magnesium: 2.3 mg/dL (ref 1.7–2.4)

## 2020-05-18 MED ORDER — FUROSEMIDE 80 MG PO TABS
80.0000 mg | ORAL_TABLET | Freq: Once | ORAL | Status: AC
  Administered 2020-05-18: 80 mg via ORAL
  Filled 2020-05-18: qty 1

## 2020-05-18 MED ORDER — PEG 3350-KCL-NA BICARB-NACL 420 G PO SOLR
4000.0000 mL | Freq: Once | ORAL | Status: AC
  Administered 2020-05-18: 4000 mL via ORAL
  Filled 2020-05-18: qty 4000

## 2020-05-18 NOTE — H&P (View-Only) (Signed)
Subjective: Feeling better.  Objective: Vital signs in last 24 hours: Temp:  [96.3 F (35.7 C)-98.4 F (36.9 C)] 97.7 F (36.5 C) (06/26 0743) Pulse Rate:  [69-90] 77 (06/26 0743) Resp:  [13-21] 19 (06/26 0743) BP: (118-143)/(53-78) 139/69 (06/26 0743) SpO2:  [95 %-100 %] 99 % (06/26 0822) Last BM Date: 05/17/20  Intake/Output from previous day: 06/25 0701 - 06/26 0700 In: -  Out: 2600 [Urine:2600] Intake/Output this shift: No intake/output data recorded.  General appearance: alert, no distress and weak GI: soft, non-tender; bowel sounds normal; no masses,  no organomegaly  Lab Results: Recent Labs    05/16/20 0350 05/17/20 1021 05/18/20 0223  WBC 8.4 6.0 5.0  HGB 7.9* 8.5* 8.0*  HCT 26.3* 29.2* 27.5*  PLT 144* 139* 122*   BMET Recent Labs    05/16/20 0350 05/17/20 1021 05/18/20 0223  NA 135 140 138  K 5.1 4.4 4.5  CL 104 104 102  CO2 21* 26 28  GLUCOSE 256* 120* 121*  BUN 69* 77* 76*  CREATININE 1.35* 1.25* 1.13*  CALCIUM 9.6 10.1 10.0   LFT Recent Labs    05/18/20 0223  PROT 6.2*  ALBUMIN 2.4*  AST 22  ALT 18  ALKPHOS 58  BILITOT 0.8   PT/INR No results for input(s): LABPROT, INR in the last 72 hours. Hepatitis Panel No results for input(s): HEPBSAG, HCVAB, HEPAIGM, HEPBIGM in the last 72 hours. C-Diff No results for input(s): CDIFFTOX in the last 72 hours. Fecal Lactopherrin No results for input(s): FECLLACTOFRN in the last 72 hours.  Studies/Results: No results found.  Medications:  Scheduled: . allopurinol  300 mg Oral Daily  . ascorbic acid  500 mg Oral Daily  . [START ON 05/19/2020] aspirin  81 mg Oral Daily  . brimonidine  1 drop Both Eyes BID  . Chlorhexidine Gluconate Cloth  6 each Topical Daily  . cholecalciferol  1,000 Units Oral Daily  . collagenase  1 application Topical Daily  . cycloSPORINE  1 drop Both Eyes BID  . docusate sodium  100 mg Oral BID  . dorzolamide  1 drop Both Eyes Daily  . enoxaparin (LOVENOX)  injection  40 mg Subcutaneous Q24H  . feeding supplement (ENSURE ENLIVE)  237 mL Oral BID BM  . feeding supplement (PRO-STAT SUGAR FREE 64)  30 mL Oral BID WC  . ferrous sulfate  325 mg Oral BID WC  . FLUoxetine  40 mg Oral Daily  . gabapentin  100 mg Oral TID  . guaiFENesin  600 mg Oral BID  . insulin aspart  0-9 Units Subcutaneous TID WC  . ipratropium-albuterol  3 mL Nebulization TID  . isosorbide mononitrate  60 mg Oral Daily  . latanoprost  1 drop Both Eyes QHS  . magnesium oxide  400 mg Oral BID  . methylPREDNISolone (SOLU-MEDROL) injection  40 mg Intravenous Daily  . multivitamin with minerals  1 tablet Oral Daily  . pantoprazole (PROTONIX) IV  40 mg Intravenous Q12H  . senna-docusate  1 tablet Oral Daily  . tacrolimus  0.5 mg Oral QHS  . tacrolimus  1 mg Oral Daily  . valACYclovir  500 mg Oral BID  . verapamil  180 mg Oral Daily  . vitamin B-12  1,000 mcg Oral Daily   Continuous:   Assessment/Plan: 1) IDA. 2) Pulm HTN. 3) History of renal transplant.   The patient appears much better today.  Her HGB is stable.  She is still very weak, but she is willing to  try an prep for the EGD/colonoscopy tomorrow.  Plan: 1) EGD/colonoscopy tomorrow. 2) Follow HGB and transfuse as needed.  LOS: 5 days   Emberlyn Burlison D 05/18/2020, 11:02 AM

## 2020-05-18 NOTE — Progress Notes (Signed)
Patient refused use of BIPAP for the evening, she, RT will continue to monitor pt.

## 2020-05-18 NOTE — Progress Notes (Signed)
PROGRESS NOTE                                                                                                                                                                                                             Patient Demographics:    Pernell Dikes, is a 64 y.o. female, DOB - [birth year], AYT:[id number]  Admit date - 05/13/2020   Admitting Physician Lequita Halt, MD  Outpatient Primary MD for the patient is [name], MD  LOS - 5  Chief Complaint  Patient presents with  . Shortness of Breath  . Chest Pain       Brief Narrative - HILDAGARDE HOLLERAN is a 64 y.o. female nursing home resident at [facility] with medical history significant of polycystic kidney disease s/p renal transplant (on prednisone, mycophenalate, prograf), COPD on 2L [place] baseline, diastolic CHF, chronic hypoxic resp failure, obesity, HTN, pulmonary HTN, recurrent UTI, presented to ED withSOB wheezingand fever with no clear source, she has recently been admitted 3-4 times in the last few weeks for similar problems.  She states that about 6 months ago she had a mechanical fall with left-sided hip fracture and since then she has been able to go home and has been in a nursing home.  Her main complaint was gradually progressive shortness of breath, fatigue and low-grade fevers.   Subjective:   Patient in bed, appears comfortable, denies any headache, no fever, no chest pain or pressure, no shortness of breath , no abdominal pain. No focal weakness.   Assessment  & Plan :     1.  Sepsis with leukocytosis, elevated procalcitonin and CRP.  In a patient with kidney transplant, on chronic immunosuppressive therapy with no clear-cut source.  At this time she has had multiple similar admissions in the recent past and no convincing source has been found, blood cultures are pending, she was initially on IV vancomycin and cefepime transition to cefepime only on 05/15/2020.  Recent echo from few weeks ago  reviewed without any hint of vegetation, CT chest abdomen pelvis remains unrevealing, unremarkable V/Q and leg venous ultrasound, MRI of the sacrum does not show any abscess or osteomyelitis around the sacral wound,-ve CMV titers.  Sepsis pathophysiology has resolved, continue supportive care with gentle hydration with IV fluids.  ID following currently on cefepime with negative cultures so far.   Recent Labs  Lab 05/13/20 1104 05/13/20 1603 05/14/20 0243 05/14/20 0827 05/15/20 0348 05/16/20 0350 05/17/20 1021 05/18/20 0223  WBC  --   --  18.7*  --  8.3 8.4 6.0 5.0  PLT  --   --  205  --  149* 144* 139* 122*  CRP  --   --   --  27.0* 17.7* 8.5* 2.6* 1.4*  DDIMER  --   --   --  2.75* 1.47* 1.00* 1.03* 0.68*  PROCALCITON  --    < > 6.01  --  3.96 4.17 2.01 1.12  LATICACIDVEN 0.9  --   --  1.1  --   --   --   --    < > = values in this interval not displayed.    2.  History of renal transplant in 2013 due to underlying polycystic kidney disease.  Case discussed with nephrologist Dr. Vanetta Mulders on 05/14/2020, okay for IV contrast with hydration, IV fluids continued, discontinue Myfortic as suggested by nephrology, continue other home medications with higher dose IV steroids, gradually taper to chronic home dose oral steroid.  3.  History of SVT. verapamil dose adjusted further.  4.  History of pulmonary hypertension.  Supportive care.  5.  Chronic grade 2 diastolic dysfunction with EF 55%.  Currently compensated clinically, BNP elevated likely due to pulmonary hypertension.  On home dose Lasix will continue to monitor.  6.  Chronic iron deficiency anemia with mixed picture with low iron reserves but elevated MCV.  Oral iron supplementation, no source of ongoing active bleeding however H&H dropped further on 05/15/2020 due to heme dilution, 1 unit packed RBC on 05/15/2020.  Since she has had a recurrent drop in H&H even previous admissions, case discussed with GI physician Dr. Benson Norway on  05/17/2020, plan is outpatient work-up.  7.  Trace leg edema due to malnutrition.  Protein supplements.  Resumed Lasix.  8.  Borderline elevated D-dimer.  Due to inflammation improved after hydration, on prophylactic dose Lovenox, VQ scan and leg venous ultrasound negative.  9.  Hyponatremia.  Was due to dehydration improved after IV fluids.   10.  AKI.  Likely due to sepsis and hypotension, continue to hydrate and avoid nephrotoxins, monitor closely.   11.  Urinary retention.  Foley placed on 05/14/2020.  Foley removed on 05/17/2020.  Voiding well for now.  12. Small unstageable sacrococcygeal decubitus ulcer which is chronic.  Wound care following.  Appears noninfected.  See wound care note for details.  MRI being done to rule out any deep-seated infection.  Pressure Injury 04/29/20 Coccyx Mid Stage 3 -  Full thickness tissue loss. Subcutaneous fat may be visible but bone, tendon or muscle are NOT exposed. (Active)  04/29/20 2130  Location: Coccyx  Location Orientation: Mid  Staging: Stage 3 -  Full thickness tissue loss. Subcutaneous fat may be visible but bone, tendon or muscle are NOT exposed.  Wound Description (Comments):   Present on Admission: Yes      Condition - Extremely Guarded  Family Communication  :   Left for husband Edison Nasuti [phone] on 05/14/2020 at 13:14 PM  Daughter [name] [phone] on 02/10/2020 at 11:36 AM.   Code Status :  Full  Consults  :  PCCM, Renal Dr. Moshe Cipro over the phone, ID, GI  Procedures  :    MRI - pelvis-  VQ - NML  CT chest abdomen pelvis - 1. No acute intra-abdominal or pelvic pathology. 2. Polycystic kidney disease with a right lower quadrant renal transplant. No hydronephrosis or nephrolithiasis. 3. No bowel obstruction. Normal appendix. 4. Small bilateral pleural effusions with associated bibasilar atelectasis. Please see report  for chest CT. 5. Aortic Atherosclerosis (ICD10-I70.0).  No acute findings in the chest except  changes suggestive of pulmonary hypertension.  Mild bilateral pleural effusions with compressive atelectasis.   Bilateral leg venous ultrasound.  No DVT.     TTE. 04/17/20 -  1. Left ventricular ejection fraction, by estimation, is 55 to 60%. The left ventricle has normal function. The left ventricle has no regional wall motion abnormalities. Left ventricular diastolic parameters are consistent with Grade II diastolic dysfunction (pseudonormalization). Elevated left atrial pressure.  2. Right ventricular systolic function is normal. The right ventricular size is normal. There is mildly elevated pulmonary artery systolic pressure. The estimated right ventricular systolic pressure is 19.5 mmHg.  3. Left atrial size was mildly dilated.  4. The mitral valve is degenerative. Trivial mitral valve regurgitation. No evidence of mitral stenosis.  5. The aortic valve is tricuspid. Aortic valve regurgitation is not visualized. No aortic stenosis is present.  6. The inferior vena cava is dilated in size with <50% respiratory variability, suggesting right atrial pressure of 15 mmHg. Comparison(s): No significant change from prior study.   PUD Prophylaxis : PPI  Disposition Plan  :    Status is: Inpatient  Remains inpatient appropriate because:IV treatments appropriate due to intensity of illness or inability to take PO   Dispo: The patient is from: SNF              Anticipated d/c is to: SNF              Anticipated d/c date is: > 3 days              Patient currently is not medically stable to d/c.   DVT Prophylaxis  :  Lovenox    Lab Results  Component Value Date   PLT 122 (L) 05/18/2020    Diet :  Diet Order            Diet Heart Room service appropriate? No; Fluid consistency: Thin  Diet effective now                  Inpatient Medications Scheduled Meds: . allopurinol  300 mg Oral Daily  . ascorbic acid  500 mg Oral Daily  . [START ON 05/19/2020] aspirin  81 mg Oral Daily  .  brimonidine  1 drop Both Eyes BID  . Chlorhexidine Gluconate Cloth  6 each Topical Daily  . cholecalciferol  1,000 Units Oral Daily  . collagenase  1 application Topical Daily  . cycloSPORINE  1 drop Both Eyes BID  . docusate sodium  100 mg Oral BID  . dorzolamide  1 drop Both Eyes Daily  . enoxaparin (LOVENOX) injection  40 mg Subcutaneous Q24H  . feeding supplement (ENSURE ENLIVE)  237 mL Oral BID BM  . feeding supplement (PRO-STAT SUGAR FREE 64)  30 mL Oral BID WC  . ferrous sulfate  325 mg Oral BID WC  . FLUoxetine  40 mg Oral Daily  . gabapentin  100 mg Oral TID  . guaiFENesin  600 mg Oral BID  . insulin aspart  0-9 Units Subcutaneous TID WC  . ipratropium-albuterol  3 mL Nebulization TID  . isosorbide mononitrate  60 mg Oral Daily  . latanoprost  1 drop Both Eyes QHS  . magnesium oxide  400 mg Oral BID  . methylPREDNISolone (SOLU-MEDROL) injection  40 mg Intravenous Daily  . multivitamin with minerals  1 tablet Oral Daily  . pantoprazole (PROTONIX) IV  40 mg Intravenous  Q12H  . senna-docusate  1 tablet Oral Daily  . tacrolimus  0.5 mg Oral QHS  . tacrolimus  1 mg Oral Daily  . valACYclovir  500 mg Oral BID  . verapamil  180 mg Oral Daily  . vitamin B-12  1,000 mcg Oral Daily   Continuous Infusions:  PRN Meds:.acetaminophen **OR** [DISCONTINUED] acetaminophen, levalbuterol, [DISCONTINUED] ondansetron **OR** ondansetron (ZOFRAN) IV  Antibiotics  :   Anti-infectives (From admission, onward)   Start     Dose/Rate Route Frequency Ordered Stop   05/14/20 0200  vancomycin (VANCOREADY) IVPB 750 mg/150 mL  Status:  Discontinued        750 mg 150 mL/hr over 60 Minutes Intravenous Every 12 hours 05/13/20 1239 05/15/20 1522   05/14/20 0000  ceFEPIme (MAXIPIME) 2 g in sodium chloride 0.9 % 100 mL IVPB        2 g 200 mL/hr over 30 Minutes Intravenous Every 12 hours 05/13/20 1239 05/17/20 1300   05/13/20 1500  valACYclovir (VALTREX) tablet 500 mg     Discontinue     500 mg Oral 2  times daily 05/13/20 1420     05/13/20 1115  ceFEPIme (MAXIPIME) 2 g in sodium chloride 0.9 % 100 mL IVPB        2 g 200 mL/hr over 30 Minutes Intravenous  Once 05/13/20 1105 05/13/20 1310   05/13/20 1115  metroNIDAZOLE (FLAGYL) IVPB 500 mg        500 mg 100 mL/hr over 60 Minutes Intravenous  Once 05/13/20 1105 05/13/20 1311   05/13/20 1115  vancomycin (VANCOCIN) IVPB 1000 mg/200 mL premix  Status:  Discontinued        1,000 mg 200 mL/hr over 60 Minutes Intravenous  Once 05/13/20 1105 05/13/20 1107   05/13/20 1115  vancomycin (VANCOREADY) IVPB 1500 mg/300 mL        1,500 mg 150 mL/hr over 120 Minutes Intravenous  Once 05/13/20 1107 05/13/20 1612          Objective:   Vitals:   05/18/20 0000 05/18/20 0400 05/18/20 0743 05/18/20 0822  BP: 136/60 132/78 139/69   Pulse: 79 69 77   Resp: 20 19 19    Temp: 98.4 F (36.9 C) 98.1 F (36.7 C) 97.7 F (36.5 C)   TempSrc: Oral Oral Oral   SpO2: 100% 96% 98% 99%  Weight:      Height:        SpO2: 99 % O2 Flow Rate (L/min): 2 L/min FiO2 (%): 40 %  Wt Readings from Last 3 Encounters:  05/16/20 74.6 kg  05/02/20 74.4 kg  04/19/20 73.2 kg     Intake/Output Summary (Last 24 hours) at 05/18/2020 1000 Last data filed at 05/18/2020 0700 Gross per 24 hour  Intake --  Output 2600 ml  Net -2600 ml     Physical Exam  Awake Alert, No new F.N deficits, Normal affect [place].AT,PERRAL Supple Neck,No JVD, No cervical lymphadenopathy appriciated.  Symmetrical Chest wall movement, Good air movement bilaterally, CTAB RRR,No Gallops, Rubs or new Murmurs, No Parasternal Heave +ve B.Sounds, Abd Soft, No tenderness, No organomegaly appriciated, No rebound - guarding or rigidity. No Cyanosis, Clubbing or edema, No new Rash or bruise   Pressure Injury 04/29/20 Coccyx Mid Stage 3 -  Full thickness tissue loss. Subcutaneous fat may be visible but bone, tendon or muscle are NOT exposed. (Active)  04/29/20 2130  Location: Coccyx  Location  Orientation: Mid  Staging: Stage 3 -  Full thickness tissue loss. Subcutaneous fat  may be visible but bone, tendon or muscle are NOT exposed.  Wound Description (Comments):   Present on Admission: Yes     Data Review:    Recent Labs  Lab 05/13/20 1044 05/13/20 1129 05/14/20 0243 05/14/20 0243 05/15/20 0348 05/15/20 1138 05/16/20 0350 05/17/20 1021 05/18/20 0223  WBC 18.8*   < > 18.7*  --  8.3  --  8.4 6.0 5.0  HGB 9.5*   < > 8.5*   < > 6.7* 8.8* 7.9* 8.5* 8.0*  HCT 32.4*   < > 28.8*   < > 22.5* 29.0* 26.3* 29.2* 27.5*  PLT 236   < > 205  --  149*  --  144* 139* 122*  MCV 103.8*   < > 102.9*  --  104.2*  --  102.7* 103.5* 102.2*  MCH 30.4   < > 30.4  --  31.0  --  30.9 30.1 29.7  MCHC 29.3*   < > 29.5*  --  29.8*  --  30.0 29.1* 29.1*  RDW 23.3*   < > 23.3*  --  22.5*  --  21.8* 20.9* 20.0*  LYMPHSABS 1.2  --   --   --  0.3*  --  0.3* 0.7 0.5*  MONOABS 0.6  --   --   --  0.1  --  0.1 0.3 0.3  EOSABS 0.1  --   --   --  0.0  --  0.0 0.0 0.0  BASOSABS 0.0  --   --   --  0.0  --  0.0 0.0 0.0   < > = values in this interval not displayed.    Recent Labs  Lab 05/13/20 1044 05/13/20 1129 05/13/20 1148 05/13/20 1603 05/14/20 0243 05/14/20 0827 05/15/20 0348 05/16/20 0350 05/17/20 1021 05/18/20 0223  NA 136   < >  --   --  136  --  128* 135 140 138  K 4.3   < >  --   --  3.8  --  3.4* 5.1 4.4 4.5  CL 101   < >  --   --  102  --  95* 104 104 102  CO2 25   < >  --   --  24  --  19* 21* 26 28  GLUCOSE 129*   < >  --   --  94  --  216* 256* 120* 121*  BUN 22   < >  --   --  30*  --  45* 69* 77* 76*  CREATININE 0.90   < >  --   --  1.06*  --  0.93 1.35* 1.25* 1.13*  CALCIUM 9.6   < >  --   --  9.3  --  7.7* 9.6 10.1 10.0  AST 22  --   --   --   --   --  14* 16 17 22   ALT 18  --   --   --   --   --  14 14 14 18   ALKPHOS 94  --   --   --   --   --  54 61 61 58  BILITOT 1.3*  --   --   --   --   --  3.9* 0.8 0.9 0.8  ALBUMIN 2.8*  --   --   --   --   --  1.8* 2.2* 2.4* 2.4*   MG  --   --   --   --   --  1.5* 2.0 2.5* 2.6* 2.3  CRP  --   --   --   --   --  27.0* 17.7* 8.5* 2.6* 1.4*  DDIMER  --   --   --   --   --  2.75* 1.47* 1.00* 1.03* 0.68*  PROCALCITON  --   --   --    < > 6.01  --  3.96 4.17 2.01 1.12  INR  --   --  1.2  --   --   --   --   --   --   --   BNP  --   --   --    < >  --  468.5* 534.1* 608.9* 886.6* 569.2*   < > = values in this interval not displayed.    Recent Labs  Lab 05/13/20 1353 05/13/20 1603 05/14/20 0243 05/14/20 0827 05/15/20 0348 05/16/20 0350 05/17/20 1021 05/18/20 0223  CRP  --   --   --  27.0* 17.7* 8.5* 2.6* 1.4*  DDIMER  --   --   --  2.75* 1.47* 1.00* 1.03* 0.68*  BNP  --    < >  --  468.5* 534.1* 608.9* 886.6* 569.2*  PROCALCITON  --    < > 6.01  --  3.96 4.17 2.01 1.12  SARSCOV2NAA NEGATIVE  --   --   --   --   --   --   --    < > = values in this interval not displayed.    ------------------------------------------------------------------------------------------------------------------ No results for input(s): CHOL, HDL, LDLCALC, TRIG, CHOLHDL, LDLDIRECT in the last 72 hours.  Lab Results  Component Value Date   HGBA1C 5.9 (H) 04/29/2020   ------------------------------------------------------------------------------------------------------------------ No results for input(s): TSH, T4TOTAL, T3FREE, THYROIDAB in the last 72 hours.  Invalid input(s): FREET3 ------------------------------------------------------------------------------------------------------------------ No results for input(s): VITAMINB12, FOLATE, FERRITIN, TIBC, IRON, RETICCTPCT in the last 72 hours.  Coagulation profile Recent Labs  Lab 05/13/20 1148  INR 1.2    Recent Labs    05/17/20 1021 05/18/20 0223  DDIMER 1.03* 0.68*    Cardiac Enzymes No results for input(s): CKMB, TROPONINI, MYOGLOBIN in the last 168 hours.  Invalid input(s):  CK ------------------------------------------------------------------------------------------------------------------    Component Value Date/Time   BNP 569.2 (H) 05/18/2020 2409    Micro Results Recent Results (from the past 240 hour(s))  Blood Culture (routine x 2)     Status: None   Collection Time: 05/13/20 10:58 AM   Specimen: BLOOD LEFT HAND  Result Value Ref Range Status   Specimen Description BLOOD LEFT HAND  Final   Special Requests   Final    BOTTLES DRAWN AEROBIC AND ANAEROBIC Blood Culture adequate volume   Culture   Final    NO GROWTH 5 DAYS Performed at [facility], [address] [place], [place] [address]    Report Status 05/18/2020 FINAL  Final  Blood Culture (routine x 2)     Status: None   Collection Time: 05/13/20 11:48 AM   Specimen: BLOOD  Result Value Ref Range Status   Specimen Description BLOOD RIGHT ANTECUBITAL  Final   Special Requests   Final    BOTTLES DRAWN AEROBIC AND ANAEROBIC Blood Culture adequate volume   Culture   Final    NO GROWTH 5 DAYS Performed at [facility], [place] [address] [place], [place] [address]    Report Status 05/18/2020 FINAL  Final  SARS Coronavirus 2 by RT PCR (hospital order, performed  in [place] lab) Nasopharyngeal Nasopharyngeal Swab     Status: None   Collection Time: 05/13/20  1:53 PM   Specimen: Nasopharyngeal Swab  Result Value Ref Range Status   SARS Coronavirus 2 NEGATIVE NEGATIVE Final    Comment: (NOTE) SARS-CoV-2 target nucleic acids are NOT DETECTED.  The SARS-CoV-2 RNA is generally detectable in upper and lower respiratory specimens during the acute phase of infection. The lowest concentration of SARS-CoV-2 viral copies this assay can detect is 250 copies / mL. A negative result does not preclude SARS-CoV-2 infection and should not be used as the sole basis for treatment or other patient management decisions.  A negative result may occur with improper specimen collection /  handling, submission of specimen other than nasopharyngeal swab, presence of viral mutation(s) within the areas targeted by this assay, and inadequate number of viral copies (<250 copies / mL). A negative result must be combined with clinical observations, patient history, and epidemiological information.  Fact Sheet for Patients:   StrictlyIdeas.no  Fact Sheet for Healthcare Providers: BankingDealers.co.za  This test is not yet approved or  cleared by the [place] FDA and has been authorized for detection and/or diagnosis of SARS-CoV-2 by FDA under an Emergency Use Authorization (EUA).  This EUA will remain in effect (meaning this test can be used) for the duration of the COVID-19 declaration under Section 564(b)(1) of the Act, 21 U.S.C. section 360bbb-3(b)(1), unless the authorization is terminated or revoked sooner.  Performed at [facility], [place] [address], [place], [place] [address]   Urine culture     Status: None   Collection Time: 05/13/20  3:12 PM   Specimen: In/Out Cath Urine  Result Value Ref Range Status   Specimen Description IN/OUT CATH URINE  Final   Special Requests NONE  Final   Culture   Final    NO GROWTH Performed at [facility], [place] [address], [place], [place] [address]    Report Status 05/14/2020 FINAL  Final  MRSA PCR Screening     Status: None   Collection Time: 05/14/20  4:46 AM   Specimen: Nasal Mucosa; Nasopharyngeal  Result Value Ref Range Status   MRSA by PCR NEGATIVE NEGATIVE Final    Comment:        The GeneXpert MRSA Assay (FDA approved for NASAL specimens only), is one component of a comprehensive MRSA colonization surveillance program. It is not intended to diagnose MRSA infection nor to guide or monitor treatment for MRSA infections. Performed at [facility], [place] [address] [place], [place] [address]     Radiology Reports CT CHEST WO CONTRAST  Result  Date: 05/14/2020 CLINICAL DATA:  64 year old female with shortness of breath and chest pain. EXAM: CT CHEST WITHOUT CONTRAST TECHNIQUE: Multidetector CT imaging of the chest was performed following the standard protocol without IV contrast. COMPARISON:  Chest radiograph dated 04/29/2020. FINDINGS: Evaluation of this exam is limited in the absence of intravenous contrast. Cardiovascular: There is mild cardiomegaly. No pericardial effusion. Coronary vascular calcification. There is mild atherosclerotic calcification of the thoracic aorta. There is mild dilatation of the main pulmonary trunk suggestive of pulmonary hypertension. Clinical correlation is recommended. Evaluation of the vasculature is limited in the absence of intravenous contrast. Mediastinum/Nodes: No hilar or mediastinal adenopathy. The esophagus and the thyroid gland are grossly unremarkable. No mediastinal fluid collection. Lungs/Pleura: Small bilateral pleural effusions similar to prior CT with associated partial compressive atelectasis of the lower lobes. Pneumonia is  not excluded. Clinical correlation is recommended. There are bibasilar linear atelectasis/scarring. No pneumothorax. The central airways are patent. Upper Abdomen: Please see the report for the accompanying CT abdomen pelvis. Musculoskeletal: No chest wall mass or suspicious bone lesions identified. IMPRESSION: 1. Small bilateral pleural effusions with associated partial compressive atelectasis of the lower lobes. Pneumonia is not excluded. Clinical correlation is recommended. 2. Mild cardiomegaly with coronary vascular calcification. 3. Mildly dilated main pulmonary trunk suggestive of pulmonary hypertension. Clinical correlation is recommended. 4. Aortic Atherosclerosis (ICD10-I70.0). Electronically Signed   By: Anner Crete M.D.   On: 05/14/2020 21:51   CT Chest Wo Contrast  Result Date: 04/29/2020 CLINICAL DATA:  Pneumonia. Pleural effusion or abscess suspected. Follow-up to  current chest radiograph. EXAM: CT CHEST WITHOUT CONTRAST TECHNIQUE: Multidetector CT imaging of the chest was performed following the standard protocol without IV contrast. COMPARISON:  Current chest radiograph.  CT chest dated 04/15/2020. FINDINGS: Cardiovascular: Heart is mildly enlarged. No pericardial effusion. Minimal left coronary artery calcifications. Aorta is normal in caliber. Minor atherosclerotic calcifications along the descending portion. Main pulmonary artery is dilated to 3.8 cm. Mediastinum/Nodes: No neck base, mediastinal or hilar masses or enlarged lymph nodes. Trachea and esophagus are unremarkable. Lungs/Pleura: Small right and trace left pleural effusions. Mild dependent atelectasis noted in both lower lobes and minimally at the diaphragmatic bases of the left upper lobe lingula right middle lobe and dependent upper lobes. Minor linear atelectasis in the left upper lobe. No convincing pneumonia and no evidence of pulmonary edema. No mass or suspicious nodule. No pneumothorax. Upper Abdomen: Multiple liver and bilateral renal cysts consistent with polycystic kidney disease. Enlarged spleen, 14.5 cm greatest transverse dimension. No acute findings in the visualized upper abdomen and no change from the prior CT. Musculoskeletal: Multiple old bilateral rib fractures. No acute fractures. No osteoblastic or osteolytic lesions. IMPRESSION: 1. Small right and trace left pleural effusions. There is associated atelectasis, but no convincing pneumonia and no evidence of pulmonary edema. No abscess. The lung appearance is similar to the previous chest CT. 2. Mild cardiomegaly. 3. Enlarged main pulmonary artery suggesting pulmonary artery hypertension. 4. Stable changes polycystic kidney disease including multiple liver cysts. Stable splenomegaly. Aortic Atherosclerosis (ICD10-I70.0). Electronically Signed   By: Lajean Manes M.D.   On: 04/29/2020 16:57   NM Pulmonary Perfusion  Result Date:  05/14/2020 CLINICAL DATA:  Hypoxemia with elevated D-dimer. EXAM: NUCLEAR MEDICINE PERFUSION LUNG SCAN TECHNIQUE: Perfusion images were obtained in multiple projections after intravenous injection of radiopharmaceutical. Ventilation scans intentionally deferred if perfusion scan and chest x-ray adequate for interpretation during COVID 19 epidemic. RADIOPHARMACEUTICALS:  1.5 mCi Tc-26m MAA IV COMPARISON:  None. FINDINGS: There is normal homogeneous distribution of tracer activity seen throughout both lungs. No segmental or subsegmental perfusion defects are identified. IMPRESSION: Normal nuclear medicine perfusion lung scan. Electronically Signed   By: Virgina Norfolk M.D.   On: 05/14/2020 19:46   CT ABDOMEN PELVIS W CONTRAST  Result Date: 05/14/2020 CLINICAL DATA:  64 year old female with abdominal pain. EXAM: CT ABDOMEN AND PELVIS WITH CONTRAST TECHNIQUE: Multidetector CT imaging of the abdomen and pelvis was performed using the standard protocol following bolus administration of intravenous contrast. CONTRAST:  125mL OMNIPAQUE IOHEXOL 300 MG/ML  SOLN COMPARISON:  CT abdomen pelvis dated 04/15/2020. FINDINGS: Lower chest: Partially visualized small bilateral pleural effusions with associated bibasilar atelectasis. Please see report for chest CT. No intra-abdominal free air or free fluid. Hepatobiliary: Multiple hepatic hypodense lesions measure up to 3 cm. The larger lesions  demonstrate fluid attenuation most consistent with cysts and smaller lesions are not well characterized. No calcified gallstone. There is a small pericholecystic fluid. Pancreas: Unremarkable. No pancreatic ductal dilatation or surrounding inflammatory changes. Spleen: Top-normal spleen size measuring up to 14 cm in craniocaudal length. Adrenals/Urinary Tract: The adrenal glands unremarkable. Innumerable bilateral renal cysts essentially replacing the renal parenchyma consistent with polycystic kidney disease. There is no hydronephrosis  on either side. There is a right lower quadrant renal transplant. There is no hydronephrosis or nephrolithiasis of the transplant kidney. No peritransplant fluid collection. The urinary bladder is decompressed around a Foley catheter. Stomach/Bowel: There is moderate stool throughout the colon. There is no bowel obstruction or active inflammation. The appendix is normal. Vascular/Lymphatic: Mild aortoiliac atherosclerotic disease. The IVC is unremarkable. No portal venous gas. There is no adenopathy. Reproductive: The uterus is retroflexed. No adnexal masses. Other: Small fat containing umbilical hernia. Mild diffuse subcutaneous edema. Musculoskeletal: Osteopenia with degenerative changes of the spine. Total left hip arthroplasty. Old left pubic bone fracture as well as old sacral fractures. No acute osseous pathology. IMPRESSION: 1. No acute intra-abdominal or pelvic pathology. 2. Polycystic kidney disease with a right lower quadrant renal transplant. No hydronephrosis or nephrolithiasis. 3. No bowel obstruction. Normal appendix. 4. Small bilateral pleural effusions with associated bibasilar atelectasis. Please see report for chest CT. 5. Aortic Atherosclerosis (ICD10-I70.0). Electronically Signed   By: Anner Crete M.D.   On: 05/14/2020 21:25   MR SACRUM SI JOINTS WO CONTRAST  Result Date: 05/15/2020 CLINICAL DATA:  Chronic sacral wound with recurrent fevers. Evaluate for osteomyelitis. EXAM: MRI SACRUM WITHOUT CONTRAST TECHNIQUE: Multiplanar, multisequence MR imaging of the sacrum was performed. No intravenous contrast was administered. COMPARISON:  CT abdomen pelvis from yesterday. FINDINGS: Bones/Joint/Cartilage No suspicious marrow signal abnormality. No acute fracture or dislocation. Chronic bilateral sacral ala and S2 fractures. Prior left total hip arthroplasty with associated susceptibility artifact. Muscles and Tendons Bilateral piriformis, gluteal, and adductor muscle edema and atrophy. Soft  tissue Left-sided sacrococcygeal ulcer. No fluid collection or hematoma. No soft tissue mass. Trace free fluid in the pelvis.  Fibroid uterus. IMPRESSION: 1. Left-sided sacrococcygeal ulcer without evidence of osteomyelitis or abscess. Electronically Signed   By: Titus Dubin M.D.   On: 05/15/2020 16:49   DG Chest Port 1 View  Result Date: 05/16/2020 CLINICAL DATA:  Shortness of breath. EXAM: PORTABLE CHEST 1 VIEW COMPARISON:  One-view chest x-ray 05/14/2020. CT chest without contrast 05/14/2020. FINDINGS: The a heart size is normal. Mild pulmonary vascular congestion is noted. Small right pleural effusion and atelectasis is again noted. No new airspace disease is present. IMPRESSION: 1. Stable appearance of small right pleural effusion and atelectasis. 2. Mild pulmonary vascular congestion. Electronically Signed   By: San Morelle M.D.   On: 05/16/2020 08:06   Portable chest 1 View  Result Date: 05/14/2020 CLINICAL DATA:  Shortness of breath EXAM: PORTABLE CHEST 1 VIEW COMPARISON:  04/23/2020 FINDINGS: Cardiac shadow is mildly prominent but stable accentuated by the portable technique. Overall inspiratory effort is again poor although previously seen atelectasis has improved. No focal confluent infiltrate or sizable effusion is noted. No bony abnormality is seen. IMPRESSION: No acute abnormality noted. Electronically Signed   By: Inez Catalina M.D.   On: 05/14/2020 09:28   DG Chest Portable 1 View  Result Date: 05/13/2020 CLINICAL DATA:  Short of breath chest pain EXAM: PORTABLE CHEST 1 VIEW COMPARISON:  04/29/2020 FINDINGS: Hypoventilation with decreased lung volume. Mild bibasilar atelectasis similar to the  prior study Negative for heart failure or pneumonia. Small right pleural effusion. IMPRESSION: Hypoventilation with bibasilar atelectasis unchanged from the prior study Electronically Signed   By: Franchot Gallo M.D.   On: 05/13/2020 10:54   DG Chest Port 1 View  Result Date:  04/29/2020 CLINICAL DATA:  Shortness of breath EXAM: PORTABLE CHEST 1 VIEW COMPARISON:  Apr 16, 2020 FINDINGS: There is atelectatic change in the lower lung zones bilaterally, stable. No edema or airspace opacity. Heart is mildly enlarged. The pulmonary vascularity is within normal limits. No adenopathy. No bone lesions. IMPRESSION: Mild bibasilar atelectasis. No edema or airspace opacity. Heart mildly enlarged with pulmonary vascularity within normal limits. No evident adenopathy. Electronically Signed   By: Lowella Grip III M.D.   On: 04/29/2020 14:39   VAS [place] LOWER EXTREMITY VENOUS (DVT)  Result Date: 05/13/2020  Lower Venous DVTStudy Indications: SOB, and sepsis.  Comparison Study: 12/16/19, 02/02/20, 03/16/20 all negative for DVT Performing Technologist: June Leap RDMS, RVT  Examination Guidelines: A complete evaluation includes B-mode imaging, spectral Doppler, color Doppler, and power Doppler as needed of all accessible portions of each vessel. Bilateral testing is considered an integral part of a complete examination. Limited examinations for reoccurring indications may be performed as noted. The reflux portion of the exam is performed with the patient in reverse Trendelenburg.  +---------+---------------+---------+-----------+----------+--------------+ RIGHT    CompressibilityPhasicitySpontaneityPropertiesThrombus Aging +---------+---------------+---------+-----------+----------+--------------+ CFV      Full           Yes      Yes                                 +---------+---------------+---------+-----------+----------+--------------+ SFJ      Full                                                        +---------+---------------+---------+-----------+----------+--------------+ FV Prox  Full                                                        +---------+---------------+---------+-----------+----------+--------------+ FV Mid   Full                                                         +---------+---------------+---------+-----------+----------+--------------+ FV DistalFull                                                        +---------+---------------+---------+-----------+----------+--------------+ PFV      Full                                                        +---------+---------------+---------+-----------+----------+--------------+  POP      Full           Yes      Yes                                 +---------+---------------+---------+-----------+----------+--------------+ PTV      Full                                                        +---------+---------------+---------+-----------+----------+--------------+ PERO     Full                                                        +---------+---------------+---------+-----------+----------+--------------+   +---------+---------------+---------+-----------+----------+--------------+ LEFT     CompressibilityPhasicitySpontaneityPropertiesThrombus Aging +---------+---------------+---------+-----------+----------+--------------+ CFV      Full           Yes      Yes                                 +---------+---------------+---------+-----------+----------+--------------+ SFJ      Full                                                        +---------+---------------+---------+-----------+----------+--------------+ FV Prox  Full                                                        +---------+---------------+---------+-----------+----------+--------------+ FV Mid   Full                                                        +---------+---------------+---------+-----------+----------+--------------+ FV DistalFull                                                        +---------+---------------+---------+-----------+----------+--------------+ PFV      Full                                                         +---------+---------------+---------+-----------+----------+--------------+ POP      Full           Yes      Yes                                 +---------+---------------+---------+-----------+----------+--------------+  PTV      Full                                                        +---------+---------------+---------+-----------+----------+--------------+ PERO     Full                                                        +---------+---------------+---------+-----------+----------+--------------+     Summary: BILATERAL: - No evidence of deep vein thrombosis seen in the lower extremities, bilaterally. -No evidence of popliteal cyst, bilaterally.   *See table(s) above for measurements and observations. Electronically signed by Ruta Hinds MD on 05/13/2020 at 3:35:07 PM.    Final     Time Spent in minutes  30   Lala Lund M.D on 05/18/2020 at 10:00 AM  To page go to [email or web address] - password [email or web address]

## 2020-05-18 NOTE — Progress Notes (Signed)
Subjective: Feeling better.  Objective: Vital signs in last 24 hours: Temp:  [96.3 F (35.7 C)-98.4 F (36.9 C)] 97.7 F (36.5 C) (06/26 0743) Pulse Rate:  [69-90] 77 (06/26 0743) Resp:  [13-21] 19 (06/26 0743) BP: (118-143)/(53-78) 139/69 (06/26 0743) SpO2:  [95 %-100 %] 99 % (06/26 0822) Last BM Date: 05/17/20  Intake/Output from previous day: 06/25 0701 - 06/26 0700 In: -  Out: 2600 [Urine:2600] Intake/Output this shift: No intake/output data recorded.  General appearance: alert, no distress and weak GI: soft, non-tender; bowel sounds normal; no masses,  no organomegaly  Lab Results: Recent Labs    05/16/20 0350 05/17/20 1021 05/18/20 0223  WBC 8.4 6.0 5.0  HGB 7.9* 8.5* 8.0*  HCT 26.3* 29.2* 27.5*  PLT 144* 139* 122*   BMET Recent Labs    05/16/20 0350 05/17/20 1021 05/18/20 0223  NA 135 140 138  K 5.1 4.4 4.5  CL 104 104 102  CO2 21* 26 28  GLUCOSE 256* 120* 121*  BUN 69* 77* 76*  CREATININE 1.35* 1.25* 1.13*  CALCIUM 9.6 10.1 10.0   LFT Recent Labs    05/18/20 0223  PROT 6.2*  ALBUMIN 2.4*  AST 22  ALT 18  ALKPHOS 58  BILITOT 0.8   PT/INR No results for input(s): LABPROT, INR in the last 72 hours. Hepatitis Panel No results for input(s): HEPBSAG, HCVAB, HEPAIGM, HEPBIGM in the last 72 hours. C-Diff No results for input(s): CDIFFTOX in the last 72 hours. Fecal Lactopherrin No results for input(s): FECLLACTOFRN in the last 72 hours.  Studies/Results: No results found.  Medications:  Scheduled:  allopurinol  300 mg Oral Daily   ascorbic acid  500 mg Oral Daily   [START ON 05/19/2020] aspirin  81 mg Oral Daily   brimonidine  1 drop Both Eyes BID   Chlorhexidine Gluconate Cloth  6 each Topical Daily   cholecalciferol  1,000 Units Oral Daily   collagenase  1 application Topical Daily   cycloSPORINE  1 drop Both Eyes BID   docusate sodium  100 mg Oral BID   dorzolamide  1 drop Both Eyes Daily   enoxaparin (LOVENOX)  injection  40 mg Subcutaneous Q24H   feeding supplement (ENSURE ENLIVE)  237 mL Oral BID BM   feeding supplement (PRO-STAT SUGAR FREE 64)  30 mL Oral BID WC   ferrous sulfate  325 mg Oral BID WC   FLUoxetine  40 mg Oral Daily   gabapentin  100 mg Oral TID   guaiFENesin  600 mg Oral BID   insulin aspart  0-9 Units Subcutaneous TID WC   ipratropium-albuterol  3 mL Nebulization TID   isosorbide mononitrate  60 mg Oral Daily   latanoprost  1 drop Both Eyes QHS   magnesium oxide  400 mg Oral BID   methylPREDNISolone (SOLU-MEDROL) injection  40 mg Intravenous Daily   multivitamin with minerals  1 tablet Oral Daily   pantoprazole (PROTONIX) IV  40 mg Intravenous Q12H   senna-docusate  1 tablet Oral Daily   tacrolimus  0.5 mg Oral QHS   tacrolimus  1 mg Oral Daily   valACYclovir  500 mg Oral BID   verapamil  180 mg Oral Daily   vitamin B-12  1,000 mcg Oral Daily   Continuous:   Assessment/Plan: 1) IDA. 2) Pulm HTN. 3) History of renal transplant.   The patient appears much better today.  Her HGB is stable.  She is still very weak, but she is willing to  try an prep for the EGD/colonoscopy tomorrow.  Plan: 1) EGD/colonoscopy tomorrow. 2) Follow HGB and transfuse as needed.  LOS: 5 days   Zack Crager D 05/18/2020, 11:02 AM

## 2020-05-19 ENCOUNTER — Inpatient Hospital Stay (HOSPITAL_COMMUNITY): Payer: Medicare Other | Admitting: Certified Registered Nurse Anesthetist

## 2020-05-19 ENCOUNTER — Encounter (HOSPITAL_COMMUNITY): Payer: Self-pay | Admitting: Internal Medicine

## 2020-05-19 ENCOUNTER — Encounter (HOSPITAL_COMMUNITY)
Admission: EM | Disposition: A | Discharge status: Skilled Nursing Facility | Payer: Self-pay | Source: Home / Self Care | Attending: Internal Medicine

## 2020-05-19 HISTORY — PX: ENTEROSCOPY: SHX5533

## 2020-05-19 HISTORY — PX: BIOPSY: SHX5522

## 2020-05-19 HISTORY — PX: COLONOSCOPY WITH PROPOFOL: SHX5780

## 2020-05-19 LAB — GLUCOSE, CAPILLARY
Glucose-Capillary: 86 mg/dL (ref 70–99)
Glucose-Capillary: 106 mg/dL — ABNORMAL HIGH (ref 70–99)
Glucose-Capillary: 191 mg/dL — ABNORMAL HIGH (ref 70–99)
Glucose-Capillary: 151 mg/dL — ABNORMAL HIGH (ref 70–99)

## 2020-05-19 LAB — CBC WITH DIFFERENTIAL/PLATELET
Abs Immature Granulocytes: 0.05 10*3/uL (ref 0.00–0.07)
Basophils Absolute: 0 10*3/uL (ref 0.0–0.1)
Basophils Relative: 0 %
Eosinophils Absolute: 0 10*3/uL (ref 0.0–0.5)
Eosinophils Relative: 1 %
HCT: 29.9 % — ABNORMAL LOW (ref 36.0–46.0)
Hemoglobin: 9 g/dL — ABNORMAL LOW (ref 12.0–15.0)
Immature Granulocytes: 1 %
Lymphocytes Relative: 16 %
Lymphs Abs: 0.9 10*3/uL (ref 0.7–4.0)
MCH: 30.6 pg (ref 26.0–34.0)
MCHC: 30.1 g/dL (ref 30.0–36.0)
MCV: 101.7 fL — ABNORMAL HIGH (ref 80.0–100.0)
Monocytes Absolute: 0.3 10*3/uL (ref 0.1–1.0)
Monocytes Relative: 6 %
Neutro Abs: 4.3 10*3/uL (ref 1.7–7.7)
Neutrophils Relative %: 76 %
Platelets: 143 10*3/uL — ABNORMAL LOW (ref 150–400)
RBC: 2.94 MIL/uL — ABNORMAL LOW (ref 3.87–5.11)
RDW: 20 % — ABNORMAL HIGH (ref 11.5–15.5)
WBC: 5.7 10*3/uL (ref 4.0–10.5)
nRBC: 0 % (ref 0.0–0.2)

## 2020-05-19 LAB — BASIC METABOLIC PANEL
Anion gap: 9 (ref 5–15)
BUN: 59 mg/dL — ABNORMAL HIGH (ref 8–23)
CO2: 29 mmol/L (ref 22–32)
Calcium: 10.1 mg/dL (ref 8.9–10.3)
Chloride: 100 mmol/L (ref 98–111)
Creatinine, Ser: 0.94 mg/dL (ref 0.44–1.00)
GFR calc Af Amer: >60 mL/min (ref >60)
GFR calc non Af Amer: >60 mL/min (ref >60)
Glucose, Bld: 84 mg/dL (ref 70–99)
Potassium: 4 mmol/L (ref 3.5–5.1)
Sodium: 138 mmol/L (ref 135–145)

## 2020-05-19 LAB — C-REACTIVE PROTEIN: CRP: 0.6 mg/dL (ref <1.0)

## 2020-05-19 LAB — BRAIN NATRIURETIC PEPTIDE: B Natriuretic Peptide: 269.2 pg/mL — ABNORMAL HIGH (ref 0.0–100.0)

## 2020-05-19 LAB — PROCALCITONIN: Procalcitonin: 0.47 ng/mL

## 2020-05-19 LAB — MAGNESIUM: Magnesium: 2 mg/dL (ref 1.7–2.4)

## 2020-05-19 SURGERY — COLONOSCOPY WITH PROPOFOL
Anesthesia: Monitor Anesthesia Care

## 2020-05-19 MED ORDER — IPRATROPIUM-ALBUTEROL 0.5-2.5 (3) MG/3ML IN SOLN: 3.0000 mL | RESPIRATORY_TRACT | Status: DC | PRN

## 2020-05-19 MED ORDER — LACTATED RINGERS IV SOLN: INTRAVENOUS | Status: DC | PRN

## 2020-05-19 MED ORDER — PANTOPRAZOLE SODIUM 40 MG PO TBEC
40.0000 mg | DELAYED_RELEASE_TABLET | Freq: Two times a day (BID) | ORAL | Status: DC
  Administered 2020-05-19 – 2020-05-22 (×6): 40 mg via ORAL
  Filled 2020-05-19 (×6): qty 1

## 2020-05-19 MED ORDER — PREDNISONE 20 MG PO TABS
20.0000 mg | ORAL_TABLET | Freq: Every day | ORAL | Status: DC
  Administered 2020-05-19 – 2020-05-22 (×4): 20 mg via ORAL
  Filled 2020-05-19 (×4): qty 1

## 2020-05-19 MED ORDER — PROPOFOL 500 MG/50ML IV EMUL
INTRAVENOUS | Status: DC | PRN
  Administered 2020-05-19: 100 ug/kg/min via INTRAVENOUS

## 2020-05-19 MED ORDER — FUROSEMIDE 80 MG PO TABS
80.0000 mg | ORAL_TABLET | Freq: Every day | ORAL | Status: DC
  Administered 2020-05-19: 80 mg via ORAL
  Filled 2020-05-19: qty 1

## 2020-05-19 MED ORDER — ONDANSETRON HCL 4 MG/2ML IJ SOLN
INTRAMUSCULAR | Status: DC | PRN
  Administered 2020-05-19: 4 mg via INTRAVENOUS

## 2020-05-19 SURGICAL SUPPLY — 25 items
BLOCK BITE 60FR ADLT L/F BLUE (MISCELLANEOUS) ×4 IMPLANT
ELECT REM PT RETURN 9FT ADLT (ELECTROSURGICAL)
ELECTRODE REM PT RTRN 9FT ADLT (ELECTROSURGICAL) IMPLANT
FCP BXJMBJMB 240X2.8X (CUTTING FORCEPS)
FLOOR PAD 36X40 (MISCELLANEOUS) ×4
FORCEP RJ3 GP 1.8X160 W-NEEDLE (CUTTING FORCEPS) IMPLANT
FORCEPS BIOP RAD 4 LRG CAP 4 (CUTTING FORCEPS) IMPLANT
FORCEPS BIOP RJ4 240 W/NDL (CUTTING FORCEPS)
FORCEPS BXJMBJMB 240X2.8X (CUTTING FORCEPS) IMPLANT
INJECTOR/SNARE I SNARE (MISCELLANEOUS) IMPLANT
LUBRICANT JELLY 4.5OZ STERILE (MISCELLANEOUS) IMPLANT
MANIFOLD NEPTUNE II (INSTRUMENTS) IMPLANT
NDL SCLEROTHERAPY 25GX240 (NEEDLE) IMPLANT
NEEDLE SCLEROTHERAPY 25GX240 (NEEDLE) IMPLANT
PAD FLOOR 36X40 (MISCELLANEOUS) ×2 IMPLANT
PROBE APC STR FIRE (PROBE) IMPLANT
PROBE INJECTION GOLD (MISCELLANEOUS)
PROBE INJECTION GOLD 7FR (MISCELLANEOUS) IMPLANT
SNARE ROTATE MED OVAL 20MM (MISCELLANEOUS) IMPLANT
SNARE SHORT THROW 13M SML OVAL (MISCELLANEOUS) IMPLANT
SYR 50ML LL SCALE MARK (SYRINGE) IMPLANT
TRAP SPECIMEN MUCOUS 40CC (MISCELLANEOUS) IMPLANT
TUBING ENDO SMARTCAP PENTAX (MISCELLANEOUS) ×8 IMPLANT
TUBING IRRIGATION ENDOGATOR (MISCELLANEOUS) ×4 IMPLANT
WATER STERILE IRR 1000ML POUR (IV SOLUTION) IMPLANT

## 2020-05-19 NOTE — Transfer of Care (Signed)
Immediate Anesthesia Transfer of Care Note  Patient: April [name]  Procedure(s) Performed: COLONOSCOPY WITH PROPOFOL (N/A ) ENTEROSCOPY (N/A ) BIOPSY  Patient Location: PACU  Anesthesia Type:MAC  Level of Consciousness: awake, alert  and oriented  Airway & Oxygen Therapy: Patient Spontanous Breathing and Patient connected to nasal cannula oxygen  Post-op Assessment: Report given to RN and Post -op Vital signs reviewed and stable  Post vital signs: Reviewed and stable  Last Vitals:  Vitals Value Taken Time  BP 128/68 05/19/20 0919  Temp    Pulse 78 05/19/20 0921  Resp 20 05/19/20 0921  SpO2 100 % 05/19/20 0921  Vitals shown include unvalidated device data.  Last Pain:  Vitals:   05/19/20 0745  TempSrc: Oral  PainSc: 0-No pain      Patients Stated Pain Goal: 0 (86/75/44 [id number])  Complications: No complications documented.

## 2020-05-19 NOTE — Progress Notes (Signed)
PROGRESS NOTE                                                                                                                                                                                                             Patient Demographics:    Laria Grimmett, is a 64 y.o. female, DOB - 10-Nov-1956, TIW:[id number]  Admit date - 05/13/2020   Admitting Physician Lequita Halt, MD  Outpatient Primary MD for the patient is [name], MD  LOS - 6  Chief Complaint  Patient presents with  . Shortness of Breath  . Chest Pain       Brief Narrative - AHNIYA MITCHUM is a 64 y.o. female nursing home resident at [facility] with medical history significant of polycystic kidney disease s/p renal transplant (on prednisone, mycophenalate, prograf), COPD on 2L [facility] baseline, diastolic CHF, chronic hypoxic resp failure, obesity, HTN, pulmonary HTN, recurrent UTI, presented to ED withSOB wheezingand fever with no clear source, she has recently been admitted 3-4 times in the last few weeks for similar problems.  She states that about 6 months ago she had a mechanical fall with left-sided hip fracture and since then she has been able to go home and has been in a nursing home.  Her main complaint was gradually progressive shortness of breath, fatigue and low-grade fevers.   Subjective:   Patient in bed, appears comfortable, denies any headache, no fever, no chest pain or pressure, no shortness of breath , no abdominal pain. No focal weakness.   Assessment  & Plan :     1.  Sepsis with leukocytosis, elevated procalcitonin and CRP.  In a patient with kidney transplant, on chronic immunosuppressive therapy with no clear-cut source.  At this time she has had multiple similar admissions in the recent past and no convincing source has been found, blood cultures are pending, she was initially on IV vancomycin and cefepime transition to cefepime only on 05/15/2020.  Recent echo from few weeks ago  reviewed without any hint of vegetation, CT chest abdomen pelvis remains unrevealing, unremarkable V/Q and leg venous ultrasound, MRI of the sacrum does not show any abscess or osteomyelitis around the sacral wound,-ve CMV titers.  Sepsis pathophysiology has resolved, continue supportive care with gentle hydration with IV fluids.  ID following currently on cefepime with negative cultures so far.   Recent Labs  Lab 05/13/20 1104 05/13/20 1603 05/14/20 0243 05/14/20 0827 05/15/20 0348 05/16/20 0350 05/17/20 1021 05/18/20 2505 05/19/20 3976  WBC  --    < >   < >  --  8.3 8.4 6.0 5.0 5.7  PLT  --    < >   < >  --  149* 144* 139* 122* 143*  CRP  --   --    < > 27.0* 17.7* 8.5* 2.6* 1.4* 0.6  DDIMER  --   --   --  2.75* 1.47* 1.00* 1.03* 0.68*  --   PROCALCITON  --    < >   < >  --  3.96 4.17 2.01 1.12 0.47  LATICACIDVEN 0.9  --   --  1.1  --   --   --   --   --    < > = values in this interval not displayed.    2.  History of renal transplant in 2013 due to underlying polycystic kidney disease.  Case discussed with nephrologist Dr. Vanetta Mulders on 05/14/2020, okay for IV contrast with hydration, has been hydrated adequately, discontinue Myfortic as suggested by nephrology, IV steroids have been tapered off and now on 20 mg oral prednisone, gradually taper down to home dose.  Note Lasix to be dosed daily.  3.  History of SVT. verapamil dose adjusted further.  4.  History of pulmonary hypertension.  Supportive care.  5.  Chronic grade 2 diastolic dysfunction with EF 55%.  Currently compensated clinically, BNP elevated likely due to pulmonary hypertension. She is on Lasix at home, we are dosing it daily here.  6.  Chronic iron deficiency anemia with mixed picture with low iron reserves but elevated MCV.  Oral iron supplementation, no source of ongoing active bleeding however H&H dropped further on 05/15/2020 due to heme dilution, 1 unit packed RBC on 05/15/2020.  New PPI, seen by GI  underwent EGD and colonoscopy which were both nonacute.  Follow with GI outpatient Dr. Benson Norway.  7.  Trace leg edema due to malnutrition.  Protein supplements.  Resumed Lasix.  8.  Borderline elevated D-dimer.  Due to inflammation improved after hydration, on prophylactic dose Lovenox, VQ scan and leg venous ultrasound negative.  9.  Hyponatremia.  Was due to dehydration improved after IV fluids.   10.  AKI.  Likely due to sepsis and hypotension, continue to hydrate and avoid nephrotoxins, monitor closely.   11.  Urinary retention.  Foley placed on 05/14/2020.  Foley removed on 05/17/2020.  Voiding well for now.  12. Small unstageable sacrococcygeal decubitus ulcer which is chronic.  Wound care following.  Appears noninfected.  See wound care note for details.  MRI being done to rule out any deep-seated infection.  Pressure Injury 04/29/20 Coccyx Mid Stage 3 -  Full thickness tissue loss. Subcutaneous fat may be visible but bone, tendon or muscle are NOT exposed. (Active)  04/29/20 2130  Location: Coccyx  Location Orientation: Mid  Staging: Stage 3 -  Full thickness tissue loss. Subcutaneous fat may be visible but bone, tendon or muscle are NOT exposed.  Wound Description (Comments):   Present on Admission: Yes      Condition - Extremely Guarded  Family Communication  :   Left for husband Edison Nasuti [phone] on 05/14/2020 at 13:14 PM  Daughter [name] [phone] on 02/10/2020 at 11:36 AM.   Code Status :  Full  Consults  :  PCCM, Renal Dr. Moshe Cipro over the phone, ID, GI  Procedures  :    EGD and colonoscopy on 05/19/2020 by Dr. Carol Ada.  Mild gastritis, diverticulosis, anal condyloma.  MRI - pelvis- no sacral abscess or osteomyelitis.  VQ - NML  CT chest abdomen pelvis - 1. No acute intra-abdominal or pelvic pathology. 2. Polycystic kidney disease with a right lower quadrant renal transplant. No hydronephrosis or nephrolithiasis. 3. No bowel obstruction. Normal  appendix. 4. Small bilateral pleural effusions with associated bibasilar atelectasis. Please see report for chest CT. 5. Aortic Atherosclerosis (ICD10-I70.0).  No acute findings in the chest except changes suggestive of pulmonary hypertension.  Mild bilateral pleural effusions with compressive atelectasis.   Bilateral leg venous ultrasound.  No DVT.     TTE. 04/17/20 -  1. Left ventricular ejection fraction, by estimation, is 55 to 60%. The left ventricle has normal function. The left ventricle has no regional wall motion abnormalities. Left ventricular diastolic parameters are consistent with Grade II diastolic dysfunction (pseudonormalization). Elevated left atrial pressure.  2. Right ventricular systolic function is normal. The right ventricular size is normal. There is mildly elevated pulmonary artery systolic pressure. The estimated right ventricular systolic pressure is 91.7 mmHg.  3. Left atrial size was mildly dilated.  4. The mitral valve is degenerative. Trivial mitral valve regurgitation. No evidence of mitral stenosis.  5. The aortic valve is tricuspid. Aortic valve regurgitation is not visualized. No aortic stenosis is present.  6. The inferior vena cava is dilated in size with <50% respiratory variability, suggesting right atrial pressure of 15 mmHg. Comparison(s): No significant change from prior study.   PUD Prophylaxis : PPI  Disposition Plan  :    Status is: Inpatient  Remains inpatient appropriate because:IV treatments appropriate due to intensity of illness or inability to take PO   Dispo: The patient is from: SNF              Anticipated d/c is to: SNF              Anticipated d/c date is: > 3 days              Patient currently is not medically stable to d/c.   DVT Prophylaxis  :  Lovenox    Lab Results  Component Value Date   PLT 143 (L) 05/19/2020    Diet :  Diet Order            Diet regular Room service appropriate? Yes; Fluid consistency: Thin  Diet  effective now                  Inpatient Medications Scheduled Meds: . allopurinol  300 mg Oral Daily  . ascorbic acid  500 mg Oral Daily  . aspirin  81 mg Oral Daily  . brimonidine  1 drop Both Eyes BID  . Chlorhexidine Gluconate Cloth  6 each Topical Daily  . cholecalciferol  1,000 Units Oral Daily  . collagenase  1 application Topical Daily  . cycloSPORINE  1 drop Both Eyes BID  . docusate sodium  100 mg Oral BID  . dorzolamide  1 drop Both Eyes Daily  . enoxaparin (LOVENOX) injection  40 mg Subcutaneous Q24H  . feeding supplement (ENSURE ENLIVE)  237 mL Oral BID BM  . feeding supplement (PRO-STAT SUGAR FREE 64)  30 mL Oral BID WC  . ferrous sulfate  325 mg Oral BID WC  . FLUoxetine  40 mg Oral Daily  . furosemide  80 mg Oral Daily  . gabapentin  100 mg Oral TID  . guaiFENesin  600 mg Oral BID  . insulin aspart  0-9 Units Subcutaneous TID WC  .  isosorbide mononitrate  60 mg Oral Daily  . latanoprost  1 drop Both Eyes QHS  . magnesium oxide  400 mg Oral BID  . multivitamin with minerals  1 tablet Oral Daily  . pantoprazole (PROTONIX) IV  40 mg Intravenous Q12H  . predniSONE  20 mg Oral Q breakfast  . senna-docusate  1 tablet Oral Daily  . tacrolimus  0.5 mg Oral QHS  . tacrolimus  1 mg Oral Daily  . valACYclovir  500 mg Oral BID  . verapamil  180 mg Oral Daily  . vitamin B-12  1,000 mcg Oral Daily   Continuous Infusions:  PRN Meds:.acetaminophen **OR** [DISCONTINUED] acetaminophen, ipratropium-albuterol, levalbuterol, [DISCONTINUED] ondansetron **OR** ondansetron (ZOFRAN) IV  Antibiotics  :   Anti-infectives (From admission, onward)   Start     Dose/Rate Route Frequency Ordered Stop   05/14/20 0200  vancomycin (VANCOREADY) IVPB 750 mg/150 mL  Status:  Discontinued        750 mg 150 mL/hr over 60 Minutes Intravenous Every 12 hours 05/13/20 1239 05/15/20 1522   05/14/20 0000  ceFEPIme (MAXIPIME) 2 g in sodium chloride 0.9 % 100 mL IVPB        2 g 200 mL/hr over 30  Minutes Intravenous Every 12 hours 05/13/20 1239 05/17/20 1300   05/13/20 1500  valACYclovir (VALTREX) tablet 500 mg     Discontinue     500 mg Oral 2 times daily 05/13/20 1420     05/13/20 1115  ceFEPIme (MAXIPIME) 2 g in sodium chloride 0.9 % 100 mL IVPB        2 g 200 mL/hr over 30 Minutes Intravenous  Once 05/13/20 1105 05/13/20 1310   05/13/20 1115  metroNIDAZOLE (FLAGYL) IVPB 500 mg        500 mg 100 mL/hr over 60 Minutes Intravenous  Once 05/13/20 1105 05/13/20 1311   05/13/20 1115  vancomycin (VANCOCIN) IVPB 1000 mg/200 mL premix  Status:  Discontinued        1,000 mg 200 mL/hr over 60 Minutes Intravenous  Once 05/13/20 1105 05/13/20 1107   05/13/20 1115  vancomycin (VANCOREADY) IVPB 1500 mg/300 mL        1,500 mg 150 mL/hr over 120 Minutes Intravenous  Once 05/13/20 1107 05/13/20 1612          Objective:   Vitals:   05/19/20 0745 05/19/20 0920 05/19/20 0935 05/19/20 0940  BP: (!) 163/72 128/68 (!) 146/66   Pulse: 87 75 78   Resp: 18 20 19    Temp: 98.2 F (36.8 C) (!) 97.3 F (36.3 C)  98 F (36.7 C)  TempSrc: Oral     SpO2: 94% 100% 100%   Weight:      Height:        SpO2: 100 % O2 Flow Rate (L/min): 2 L/min FiO2 (%): 40 %  Wt Readings from Last 3 Encounters:  05/16/20 74.6 kg  05/02/20 74.4 kg  04/19/20 73.2 kg     Intake/Output Summary (Last 24 hours) at 05/19/2020 1015 Last data filed at 05/19/2020 0910 Gross per 24 hour  Intake 200 ml  Output 3200 ml  Net -3000 ml     Physical Exam  Awake Alert, No new F.N deficits, Normal affect Mariano Colon.AT,PERRAL Supple Neck,No JVD, No cervical lymphadenopathy appriciated.  Symmetrical Chest wall movement, Good air movement bilaterally, CTAB RRR,No Gallops, Rubs or new Murmurs, No Parasternal Heave +ve B.Sounds, Abd Soft, No tenderness, No organomegaly appriciated, No rebound - guarding or rigidity. No Cyanosis, Clubbing or  edema, No new Rash or bruise    Pressure Injury 04/29/20 Coccyx Mid Stage 3 -  Full  thickness tissue loss. Subcutaneous fat may be visible but bone, tendon or muscle are NOT exposed. (Active)  04/29/20 2130  Location: Coccyx  Location Orientation: Mid  Staging: Stage 3 -  Full thickness tissue loss. Subcutaneous fat may be visible but bone, tendon or muscle are NOT exposed.  Wound Description (Comments):   Present on Admission: Yes     Data Review:    Recent Labs  Lab 05/15/20 0348 05/15/20 0348 05/15/20 1138 05/16/20 0350 05/17/20 1021 05/18/20 0223 05/19/20 0412  WBC 8.3  --   --  8.4 6.0 5.0 5.7  HGB 6.7*   < > 8.8* 7.9* 8.5* 8.0* 9.0*  HCT 22.5*   < > 29.0* 26.3* 29.2* 27.5* 29.9*  PLT 149*  --   --  144* 139* 122* 143*  MCV 104.2*  --   --  102.7* 103.5* 102.2* 101.7*  MCH 31.0  --   --  30.9 30.1 29.7 30.6  MCHC 29.8*  --   --  30.0 29.1* 29.1* 30.1  RDW 22.5*  --   --  21.8* 20.9* 20.0* 20.0*  LYMPHSABS 0.3*  --   --  0.3* 0.7 0.5* 0.9  MONOABS 0.1  --   --  0.1 0.3 0.3 0.3  EOSABS 0.0  --   --  0.0 0.0 0.0 0.0  BASOSABS 0.0  --   --  0.0 0.0 0.0 0.0   < > = values in this interval not displayed.    Recent Labs  Lab 05/13/20 1044 05/13/20 1129 05/13/20 1148 05/13/20 1603 05/14/20 0827 05/14/20 0827 05/15/20 0348 05/16/20 0350 05/17/20 1021 05/18/20 0223 05/19/20 0412  NA 136   < >  --    < >  --   --  128* 135 140 138 138  K 4.3   < >  --    < >  --   --  3.4* 5.1 4.4 4.5 4.0  CL 101  --   --    < >  --   --  95* 104 104 102 100  CO2 25  --   --    < >  --   --  19* 21* 26 28 29   GLUCOSE 129*  --   --    < >  --   --  216* 256* 120* 121* 84  BUN 22  --   --    < >  --   --  45* 69* 77* 76* 59*  CREATININE 0.90  --   --    < >  --   --  0.93 1.35* 1.25* 1.13* 0.94  CALCIUM 9.6  --   --    < >  --   --  7.7* 9.6 10.1 10.0 10.1  AST 22  --   --   --   --   --  14* 16 17 22   --   ALT 18  --   --   --   --   --  14 14 14 18   --   ALKPHOS 94  --   --   --   --   --  54 61 61 58  --   BILITOT 1.3*  --   --   --   --   --  3.9* 0.8 0.9 0.8   --   ALBUMIN 2.8*  --   --   --   --   --  1.8* 2.2* 2.4* 2.4*  --   MG  --   --   --   --  1.5*   < > 2.0 2.5* 2.6* 2.3 2.0  CRP  --   --   --   --  27.0*   < > 17.7* 8.5* 2.6* 1.4* 0.6  DDIMER  --   --   --   --  2.75*  --  1.47* 1.00* 1.03* 0.68*  --   PROCALCITON  --   --   --    < >  --   --  3.96 4.17 2.01 1.12 0.47  INR  --   --  1.2  --   --   --   --   --   --   --   --   BNP  --   --   --    < > 468.5*   < > 534.1* 608.9* 886.6* 569.2* 269.2*   < > = values in this interval not displayed.    Recent Labs  Lab 05/13/20 1353 05/13/20 1603 05/14/20 0827 05/14/20 0827 05/15/20 0348 05/16/20 0350 05/17/20 1021 05/18/20 0223 05/19/20 0412  CRP  --   --  27.0*   < > 17.7* 8.5* 2.6* 1.4* 0.6  DDIMER  --   --  2.75*  --  1.47* 1.00* 1.03* 0.68*  --   BNP  --    < > 468.5*   < > 534.1* 608.9* 886.6* 569.2* 269.2*  PROCALCITON  --    < >  --   --  3.96 4.17 2.01 1.12 0.47  SARSCOV2NAA NEGATIVE  --   --   --   --   --   --   --   --    < > = values in this interval not displayed.    ------------------------------------------------------------------------------------------------------------------ No results for input(s): CHOL, HDL, LDLCALC, TRIG, CHOLHDL, LDLDIRECT in the last 72 hours.  Lab Results  Component Value Date   HGBA1C 5.9 (H) 04/29/2020   ------------------------------------------------------------------------------------------------------------------ No results for input(s): TSH, T4TOTAL, T3FREE, THYROIDAB in the last 72 hours.  Invalid input(s): FREET3 ------------------------------------------------------------------------------------------------------------------ No results for input(s): VITAMINB12, FOLATE, FERRITIN, TIBC, IRON, RETICCTPCT in the last 72 hours.  Coagulation profile Recent Labs  Lab 05/13/20 1148  INR 1.2    Recent Labs    05/17/20 1021 05/18/20 0223  DDIMER 1.03* 0.68*    Cardiac Enzymes No results for input(s): CKMB, TROPONINI,  MYOGLOBIN in the last 168 hours.  Invalid input(s): CK ------------------------------------------------------------------------------------------------------------------    Component Value Date/Time   BNP 269.2 (H) 05/19/2020 2409    Micro Results Recent Results (from the past 240 hour(s))  Blood Culture (routine x 2)     Status: None   Collection Time: 05/13/20 10:58 AM   Specimen: BLOOD LEFT HAND  Result Value Ref Range Status   Specimen Description BLOOD LEFT HAND  Final   Special Requests   Final    BOTTLES DRAWN AEROBIC AND ANAEROBIC Blood Culture adequate volume   Culture   Final    NO GROWTH 5 DAYS Performed at [facility], [address], [place], [place] [address]    Report Status 05/18/2020 FINAL  Final  Blood Culture (routine x 2)     Status: None   Collection Time: 05/13/20 11:48 AM   Specimen: BLOOD  Result Value Ref Range Status   Specimen Description BLOOD RIGHT ANTECUBITAL  Final   Special Requests   Final  BOTTLES DRAWN AEROBIC AND ANAEROBIC Blood Culture adequate volume   Culture   Final    NO GROWTH 5 DAYS Performed at [facility], [place] [address] [place], [place] [address]    Report Status 05/18/2020 FINAL  Final  SARS Coronavirus 2 by RT PCR (hospital order, performed in [facility] hospital lab) Nasopharyngeal Nasopharyngeal Swab     Status: None   Collection Time: 05/13/20  1:53 PM   Specimen: Nasopharyngeal Swab  Result Value Ref Range Status   SARS Coronavirus 2 NEGATIVE NEGATIVE Final    Comment: (NOTE) SARS-CoV-2 target nucleic acids are NOT DETECTED.  The SARS-CoV-2 RNA is generally detectable in upper and lower respiratory specimens during the acute phase of infection. The lowest concentration of SARS-CoV-2 viral copies this assay can detect is 250 copies / mL. A negative result does not preclude SARS-CoV-2 infection and should not be used as the sole basis for treatment or other patient management decisions.  A negative  result may occur with improper specimen collection / handling, submission of specimen other than nasopharyngeal swab, presence of viral mutation(s) within the areas targeted by this assay, and inadequate number of viral copies (<250 copies / mL). A negative result must be combined with clinical observations, patient history, and epidemiological information.  Fact Sheet for Patients:   StrictlyIdeas.no  Fact Sheet for Healthcare Providers: BankingDealers.co.za  This test is not yet approved or  cleared by the [place] FDA and has been authorized for detection and/or diagnosis of SARS-CoV-2 by FDA under an Emergency Use Authorization (EUA).  This EUA will remain in effect (meaning this test can be used) for the duration of the COVID-19 declaration under Section 564(b)(1) of the Act, 21 U.S.C. section 360bbb-3(b)(1), unless the authorization is terminated or revoked sooner.  Performed at [facility], [place] [address], [place], [place] [address]   Urine culture     Status: None   Collection Time: 05/13/20  3:12 PM   Specimen: In/Out Cath Urine  Result Value Ref Range Status   Specimen Description IN/OUT CATH URINE  Final   Special Requests NONE  Final   Culture   Final    NO GROWTH Performed at [facility], [place] [address] [place], [place] [address]    Report Status 05/14/2020 FINAL  Final  MRSA PCR Screening     Status: None   Collection Time: 05/14/20  4:46 AM   Specimen: Nasal Mucosa; Nasopharyngeal  Result Value Ref Range Status   MRSA by PCR NEGATIVE NEGATIVE Final    Comment:        The GeneXpert MRSA Assay (FDA approved for NASAL specimens only), is one component of a comprehensive MRSA colonization surveillance program. It is not intended to diagnose MRSA infection nor to guide or monitor treatment for MRSA infections. Performed at [facility], [place] [address], [place],  [address]      Radiology Reports CT CHEST WO CONTRAST  Result Date: 05/14/2020 CLINICAL DATA:  64 year old female with shortness of breath and chest pain. EXAM: CT CHEST WITHOUT CONTRAST TECHNIQUE: Multidetector CT imaging of the chest was performed following the standard protocol without IV contrast. COMPARISON:  Chest radiograph dated 04/29/2020. FINDINGS: Evaluation of this exam is limited in the absence of intravenous contrast. Cardiovascular: There is mild cardiomegaly. No pericardial effusion. Coronary vascular calcification. There is mild atherosclerotic calcification of the thoracic aorta. There is mild dilatation of the main pulmonary trunk suggestive of pulmonary hypertension. Clinical  correlation is recommended. Evaluation of the vasculature is limited in the absence of intravenous contrast. Mediastinum/Nodes: No hilar or mediastinal adenopathy. The esophagus and the thyroid gland are grossly unremarkable. No mediastinal fluid collection. Lungs/Pleura: Small bilateral pleural effusions similar to prior CT with associated partial compressive atelectasis of the lower lobes. Pneumonia is not excluded. Clinical correlation is recommended. There are bibasilar linear atelectasis/scarring. No pneumothorax. The central airways are patent. Upper Abdomen: Please see the report for the accompanying CT abdomen pelvis. Musculoskeletal: No chest wall mass or suspicious bone lesions identified. IMPRESSION: 1. Small bilateral pleural effusions with associated partial compressive atelectasis of the lower lobes. Pneumonia is not excluded. Clinical correlation is recommended. 2. Mild cardiomegaly with coronary vascular calcification. 3. Mildly dilated main pulmonary trunk suggestive of pulmonary hypertension. Clinical correlation is recommended. 4. Aortic Atherosclerosis (ICD10-I70.0). Electronically Signed   By: Anner Crete M.D.   On: 05/14/2020 21:51   CT Chest Wo Contrast  Result Date: 04/29/2020 CLINICAL DATA:   Pneumonia. Pleural effusion or abscess suspected. Follow-up to current chest radiograph. EXAM: CT CHEST WITHOUT CONTRAST TECHNIQUE: Multidetector CT imaging of the chest was performed following the standard protocol without IV contrast. COMPARISON:  Current chest radiograph.  CT chest dated 04/15/2020. FINDINGS: Cardiovascular: Heart is mildly enlarged. No pericardial effusion. Minimal left coronary artery calcifications. Aorta is normal in caliber. Minor atherosclerotic calcifications along the descending portion. Main pulmonary artery is dilated to 3.8 cm. Mediastinum/Nodes: No neck base, mediastinal or hilar masses or enlarged lymph nodes. Trachea and esophagus are unremarkable. Lungs/Pleura: Small right and trace left pleural effusions. Mild dependent atelectasis noted in both lower lobes and minimally at the diaphragmatic bases of the left upper lobe lingula right middle lobe and dependent upper lobes. Minor linear atelectasis in the left upper lobe. No convincing pneumonia and no evidence of pulmonary edema. No mass or suspicious nodule. No pneumothorax. Upper Abdomen: Multiple liver and bilateral renal cysts consistent with polycystic kidney disease. Enlarged spleen, 14.5 cm greatest transverse dimension. No acute findings in the visualized upper abdomen and no change from the prior CT. Musculoskeletal: Multiple old bilateral rib fractures. No acute fractures. No osteoblastic or osteolytic lesions. IMPRESSION: 1. Small right and trace left pleural effusions. There is associated atelectasis, but no convincing pneumonia and no evidence of pulmonary edema. No abscess. The lung appearance is similar to the previous chest CT. 2. Mild cardiomegaly. 3. Enlarged main pulmonary artery suggesting pulmonary artery hypertension. 4. Stable changes polycystic kidney disease including multiple liver cysts. Stable splenomegaly. Aortic Atherosclerosis (ICD10-I70.0). Electronically Signed   By: Lajean Manes M.D.   On:  04/29/2020 16:57   NM Pulmonary Perfusion  Result Date: 05/14/2020 CLINICAL DATA:  Hypoxemia with elevated D-dimer. EXAM: NUCLEAR MEDICINE PERFUSION LUNG SCAN TECHNIQUE: Perfusion images were obtained in multiple projections after intravenous injection of radiopharmaceutical. Ventilation scans intentionally deferred if perfusion scan and chest x-ray adequate for interpretation during COVID 19 epidemic. RADIOPHARMACEUTICALS:  1.5 mCi Tc-17m MAA IV COMPARISON:  None. FINDINGS: There is normal homogeneous distribution of tracer activity seen throughout both lungs. No segmental or subsegmental perfusion defects are identified. IMPRESSION: Normal nuclear medicine perfusion lung scan. Electronically Signed   By: Virgina Norfolk M.D.   On: 05/14/2020 19:46   CT ABDOMEN PELVIS W CONTRAST  Result Date: 05/14/2020 CLINICAL DATA:  64 year old female with abdominal pain. EXAM: CT ABDOMEN AND PELVIS WITH CONTRAST TECHNIQUE: Multidetector CT imaging of the abdomen and pelvis was performed using the standard protocol following bolus administration of intravenous contrast. CONTRAST:  172mL OMNIPAQUE IOHEXOL 300 MG/ML  SOLN COMPARISON:  CT abdomen pelvis dated 04/15/2020. FINDINGS: Lower chest: Partially visualized small bilateral pleural effusions with associated bibasilar atelectasis. Please see report for chest CT. No intra-abdominal free air or free fluid. Hepatobiliary: Multiple hepatic hypodense lesions measure up to 3 cm. The larger lesions demonstrate fluid attenuation most consistent with cysts and smaller lesions are not well characterized. No calcified gallstone. There is a small pericholecystic fluid. Pancreas: Unremarkable. No pancreatic ductal dilatation or surrounding inflammatory changes. Spleen: Top-normal spleen size measuring up to 14 cm in craniocaudal length. Adrenals/Urinary Tract: The adrenal glands unremarkable. Innumerable bilateral renal cysts essentially replacing the renal parenchyma consistent  with polycystic kidney disease. There is no hydronephrosis on either side. There is a right lower quadrant renal transplant. There is no hydronephrosis or nephrolithiasis of the transplant kidney. No peritransplant fluid collection. The urinary bladder is decompressed around a Foley catheter. Stomach/Bowel: There is moderate stool throughout the colon. There is no bowel obstruction or active inflammation. The appendix is normal. Vascular/Lymphatic: Mild aortoiliac atherosclerotic disease. The IVC is unremarkable. No portal venous gas. There is no adenopathy. Reproductive: The uterus is retroflexed. No adnexal masses. Other: Small fat containing umbilical hernia. Mild diffuse subcutaneous edema. Musculoskeletal: Osteopenia with degenerative changes of the spine. Total left hip arthroplasty. Old left pubic bone fracture as well as old sacral fractures. No acute osseous pathology. IMPRESSION: 1. No acute intra-abdominal or pelvic pathology. 2. Polycystic kidney disease with a right lower quadrant renal transplant. No hydronephrosis or nephrolithiasis. 3. No bowel obstruction. Normal appendix. 4. Small bilateral pleural effusions with associated bibasilar atelectasis. Please see report for chest CT. 5. Aortic Atherosclerosis (ICD10-I70.0). Electronically Signed   By: Anner Crete M.D.   On: 05/14/2020 21:25   MR SACRUM SI JOINTS WO CONTRAST  Result Date: 05/15/2020 CLINICAL DATA:  Chronic sacral wound with recurrent fevers. Evaluate for osteomyelitis. EXAM: MRI SACRUM WITHOUT CONTRAST TECHNIQUE: Multiplanar, multisequence MR imaging of the sacrum was performed. No intravenous contrast was administered. COMPARISON:  CT abdomen pelvis from yesterday. FINDINGS: Bones/Joint/Cartilage No suspicious marrow signal abnormality. No acute fracture or dislocation. Chronic bilateral sacral ala and S2 fractures. Prior left total hip arthroplasty with associated susceptibility artifact. Muscles and Tendons Bilateral  piriformis, gluteal, and adductor muscle edema and atrophy. Soft tissue Left-sided sacrococcygeal ulcer. No fluid collection or hematoma. No soft tissue mass. Trace free fluid in the pelvis.  Fibroid uterus. IMPRESSION: 1. Left-sided sacrococcygeal ulcer without evidence of osteomyelitis or abscess. Electronically Signed   By: Titus Dubin M.D.   On: 05/15/2020 16:49   DG Chest Port 1 View  Result Date: 05/16/2020 CLINICAL DATA:  Shortness of breath. EXAM: PORTABLE CHEST 1 VIEW COMPARISON:  One-view chest x-ray 05/14/2020. CT chest without contrast 05/14/2020. FINDINGS: The a heart size is normal. Mild pulmonary vascular congestion is noted. Small right pleural effusion and atelectasis is again noted. No new airspace disease is present. IMPRESSION: 1. Stable appearance of small right pleural effusion and atelectasis. 2. Mild pulmonary vascular congestion. Electronically Signed   By: San Morelle M.D.   On: 05/16/2020 08:06   Portable chest 1 View  Result Date: 05/14/2020 CLINICAL DATA:  Shortness of breath EXAM: PORTABLE CHEST 1 VIEW COMPARISON:  04/23/2020 FINDINGS: Cardiac shadow is mildly prominent but stable accentuated by the portable technique. Overall inspiratory effort is again poor although previously seen atelectasis has improved. No focal confluent infiltrate or sizable effusion is noted. No bony abnormality is seen. IMPRESSION: No acute abnormality noted.  Electronically Signed   By: Inez Catalina M.D.   On: 05/14/2020 09:28   DG Chest Portable 1 View  Result Date: 05/13/2020 CLINICAL DATA:  Short of breath chest pain EXAM: PORTABLE CHEST 1 VIEW COMPARISON:  04/29/2020 FINDINGS: Hypoventilation with decreased lung volume. Mild bibasilar atelectasis similar to the prior study Negative for heart failure or pneumonia. Small right pleural effusion. IMPRESSION: Hypoventilation with bibasilar atelectasis unchanged from the prior study Electronically Signed   By: Franchot Gallo M.D.   On:  05/13/2020 10:54   DG Chest Port 1 View  Result Date: 04/29/2020 CLINICAL DATA:  Shortness of breath EXAM: PORTABLE CHEST 1 VIEW COMPARISON:  Apr 16, 2020 FINDINGS: There is atelectatic change in the lower lung zones bilaterally, stable. No edema or airspace opacity. Heart is mildly enlarged. The pulmonary vascularity is within normal limits. No adenopathy. No bone lesions. IMPRESSION: Mild bibasilar atelectasis. No edema or airspace opacity. Heart mildly enlarged with pulmonary vascularity within normal limits. No evident adenopathy. Electronically Signed   By: Lowella Grip III M.D.   On: 04/29/2020 14:39   VAS [place] LOWER EXTREMITY VENOUS (DVT)  Result Date: 05/13/2020  Lower Venous DVTStudy Indications: SOB, and sepsis.  Comparison Study: 12/16/19, 02/02/20, 03/16/20 all negative for DVT Performing Technologist: June Leap RDMS, RVT  Examination Guidelines: A complete evaluation includes B-mode imaging, spectral Doppler, color Doppler, and power Doppler as needed of all accessible portions of each vessel. Bilateral testing is considered an integral part of a complete examination. Limited examinations for reoccurring indications may be performed as noted. The reflux portion of the exam is performed with the patient in reverse Trendelenburg.  +---------+---------------+---------+-----------+----------+--------------+ RIGHT    CompressibilityPhasicitySpontaneityPropertiesThrombus Aging +---------+---------------+---------+-----------+----------+--------------+ CFV      Full           Yes      Yes                                 +---------+---------------+---------+-----------+----------+--------------+ SFJ      Full                                                        +---------+---------------+---------+-----------+----------+--------------+ FV Prox  Full                                                         +---------+---------------+---------+-----------+----------+--------------+ FV Mid   Full                                                        +---------+---------------+---------+-----------+----------+--------------+ FV DistalFull                                                        +---------+---------------+---------+-----------+----------+--------------+ PFV      Full                                                        +---------+---------------+---------+-----------+----------+--------------+  POP      Full           Yes      Yes                                 +---------+---------------+---------+-----------+----------+--------------+ PTV      Full                                                        +---------+---------------+---------+-----------+----------+--------------+ PERO     Full                                                        +---------+---------------+---------+-----------+----------+--------------+   +---------+---------------+---------+-----------+----------+--------------+ LEFT     CompressibilityPhasicitySpontaneityPropertiesThrombus Aging +---------+---------------+---------+-----------+----------+--------------+ CFV      Full           Yes      Yes                                 +---------+---------------+---------+-----------+----------+--------------+ SFJ      Full                                                        +---------+---------------+---------+-----------+----------+--------------+ FV Prox  Full                                                        +---------+---------------+---------+-----------+----------+--------------+ FV Mid   Full                                                        +---------+---------------+---------+-----------+----------+--------------+ FV DistalFull                                                         +---------+---------------+---------+-----------+----------+--------------+ PFV      Full                                                        +---------+---------------+---------+-----------+----------+--------------+ POP      Full           Yes      Yes                                 +---------+---------------+---------+-----------+----------+--------------+  PTV      Full                                                        +---------+---------------+---------+-----------+----------+--------------+ PERO     Full                                                        +---------+---------------+---------+-----------+----------+--------------+     Summary: BILATERAL: - No evidence of deep vein thrombosis seen in the lower extremities, bilaterally. -No evidence of popliteal cyst, bilaterally.   *See table(s) above for measurements and observations. Electronically signed by Ruta Hinds MD on 05/13/2020 at 3:35:07 PM.    Final     Time Spent in minutes  30   Lala Lund M.D on 05/19/2020 at 10:15 AM  To page go to [email or web address] - password [facility]

## 2020-05-19 NOTE — Anesthesia Preprocedure Evaluation (Signed)
Anesthesia Evaluation  Patient identified by MRN, date of birth, ID band Patient awake    Reviewed: Allergy & Precautions, NPO status , Patient's Chart, lab work & pertinent test results  History of Anesthesia Complications (+) PONV  Airway Mallampati: II  TM Distance: >3 FB Neck ROM: Full    Dental no notable dental hx.    Pulmonary asthma , former smoker,    Pulmonary exam normal breath sounds clear to auscultation       Cardiovascular hypertension, +CHF  Normal cardiovascular exam Rhythm:Regular Rate:Normal     Neuro/Psych negative neurological ROS  negative psych ROS   GI/Hepatic Neg liver ROS, GERD  ,  Endo/Other  negative endocrine ROS  Renal/GU Renal disease  negative genitourinary   Musculoskeletal negative musculoskeletal ROS (+)   Abdominal   Peds negative pediatric ROS (+)  Hematology negative hematology ROS (+)   Anesthesia Other Findings   Reproductive/Obstetrics negative OB ROS                             Anesthesia Physical  Anesthesia Plan  ASA: III  Anesthesia Plan: MAC   Post-op Pain Management:    Induction:   PONV Risk Score and Plan: 4 or greater and Ondansetron, Treatment may vary due to age or medical condition and Propofol infusion  Airway Management Planned: Natural Airway and Nasal Cannula  Additional Equipment:   Intra-op Plan:   Post-operative Plan:   Informed Consent: I have reviewed the patients History and Physical, chart, labs and discussed the procedure including the risks, benefits and alternatives for the proposed anesthesia with the patient or authorized representative who has indicated his/her understanding and acceptance.       Plan Discussed with: CRNA and Surgeon  Anesthesia Plan Comments:         Anesthesia Quick Evaluation

## 2020-05-19 NOTE — Anesthesia Procedure Notes (Signed)
Procedure Name: MAC Date/Time: 05/19/2020 8:43 AM Performed by: Eligha Bridegroom, CRNA Pre-anesthesia Checklist: Patient identified, Emergency Drugs available, Suction available, Patient being monitored and Timeout performed Patient Re-evaluated:Patient Re-evaluated prior to induction Oxygen Delivery Method: Nasal cannula Induction Type: IV induction

## 2020-05-19 NOTE — Interval H&P Note (Signed)
History and Physical Interval Note:  05/19/2020 8:05 AM  April [name]  has presented today for surgery, with the diagnosis of IDA.  The various methods of treatment have been discussed with the patient and family. After consideration of risks, benefits and other options for treatment, the patient has consented to  Procedure(s): COLONOSCOPY WITH PROPOFOL (N/A) ESOPHAGOGASTRODUODENOSCOPY (EGD) WITH PROPOFOL (N/A) as a surgical intervention.  The patient's history has been reviewed, patient examined, no change in status, stable for surgery.  I have reviewed the patient's chart and labs.  Questions were answered to the patient's satisfaction.     Marlyne Totaro D

## 2020-05-19 NOTE — Progress Notes (Signed)
Pt refuses BiPAP for the night.  No distress noted at this time.

## 2020-05-19 NOTE — Progress Notes (Signed)
Patient back from endoscopy. Denies any complaints. VSS. Will continue to monitor.

## 2020-05-19 NOTE — Op Note (Addendum)
[facility] Patient Name: April [name] Procedure Date : 05/19/2020 MRN: [id number] Attending MD: Carol Ada , MD Date of Birth: 1956-08-04 CSN: [id number] Age: 64 Admit Type: Inpatient Procedure:                Small bowel enteroscopy Indications:              Iron deficiency anemia Providers:                Carol Ada, MD, Wynonia Sours, RN, Vista Lawman,                            RN, Tyrone Apple, Technician, Laverda Sorenson,                            Technician, Eligha Bridegroom CRNA, CRNA Referring MD:              Medicines:                Propofol per Anesthesia Complications:            No immediate complications. Estimated Blood Loss:     Estimated blood loss was minimal. Procedure:                Pre-Anesthesia Assessment:                           - Prior to the procedure, a History and Physical                            was performed, and patient medications and                            allergies were reviewed. The patient's tolerance of                            previous anesthesia was also reviewed. The risks                            and benefits of the procedure and the sedation                            options and risks were discussed with the patient.                            All questions were answered, and informed consent                            was obtained. Prior Anticoagulants: The patient has                            taken no previous anticoagulant or antiplatelet                            agents. ASA Grade Assessment: III - A patient with  severe systemic disease. After reviewing the risks                            and benefits, the patient was deemed in                            satisfactory condition to undergo the procedure.                           - Sedation was administered by an anesthesia                            professional. Deep sedation was attained.                           After  obtaining informed consent, the endoscope was                            passed under direct vision. Throughout the                            procedure, the patient's blood pressure, pulse, and                            oxygen saturations were monitored continuously. The                            PCF-H190DL ([id number]) Olympus pediatric colonscope                            was introduced through the mouth, and advanced to                            the small bowel distal to the Ligament of Treitz.                            After obtaining informed consent, the endoscope was                            passed under direct vision. Throughout the                            procedure, the patient's blood pressure, pulse, and                            oxygen saturations were monitored continuously. The                            small bowel enteroscopy was accomplished without                            difficulty. The patient tolerated the procedure  well. Scope In: Scope Out: Findings:      The esophagus was normal.      Localized moderately erythematous mucosa without bleeding was found in       the gastric body. Biopsies were taken with a cold forceps for histology.      The examined duodenum was normal.      There was no evidence of significant pathology in the entire examined       portion of jejunum. Impression:               - Normal esophagus.                           - Erythematous mucosa in the gastric body. Biopsied.                           - Normal examined duodenum.                           - The examined portion of the jejunum was normal. Recommendation:            Procedure Code(s):        --- Professional ---                           [phone], Small intestinal endoscopy, enteroscopy                            beyond second portion of duodenum, not including                            ileum; with biopsy, single or multiple Diagnosis Code(s):         --- Professional ---                           K31.89, Other diseases of stomach and duodenum                           D50.9, Iron deficiency anemia, unspecified CPT copyright 2019 American [employer] Association. All rights reserved. The codes documented in this report are preliminary and upon coder review may  be revised to meet current compliance requirements. Carol Ada, MD Carol Ada, MD 05/19/2020 9:24:17 AM This report has been signed electronically. Number of Addenda: 0

## 2020-05-19 NOTE — Anesthesia Postprocedure Evaluation (Signed)
Anesthesia Post Note  Patient: April [name]  Procedure(s) Performed: COLONOSCOPY WITH PROPOFOL (N/A ) ENTEROSCOPY (N/A ) BIOPSY     Patient location during evaluation: PACU Anesthesia Type: MAC Level of consciousness: awake and alert Pain management: pain level controlled Vital Signs Assessment: post-procedure vital signs reviewed and stable Respiratory status: spontaneous breathing, nonlabored ventilation, respiratory function stable and patient connected to nasal cannula oxygen Cardiovascular status: stable and blood pressure returned to baseline Postop Assessment: no apparent nausea or vomiting Anesthetic complications: no   No complications documented.  Last Vitals:  Vitals:   05/19/20 1600 05/19/20 1652  BP: 138/64 139/68  Pulse: 86   Resp: 16 18  Temp: 36.9 C 36.8 C  SpO2: 99% 98%    Last Pain:  Vitals:   05/19/20 1652  TempSrc: Oral  PainSc:                  Tiajuana Amass

## 2020-05-19 NOTE — Op Note (Addendum)
[facility] Patient Name: April [name] Procedure Date : 05/19/2020 MRN: [id number] Attending MD: Carol Ada , MD Date of Birth: [birth year] CSN: [id number] Age: 64 Admit Type: Inpatient Procedure:                Colonoscopy Indications:              Iron deficiency anemia Providers:                Carol Ada, MD, Wynonia Sours, RN, Vista Lawman,                            RN, Laverda Sorenson, Technician, Tyrone Apple,                            Technician, Eligha Bridegroom CRNA, CRNA Referring MD:              Medicines:                Propofol per Anesthesia Complications:            No immediate complications. Estimated Blood Loss:     Estimated blood loss: none. Procedure:                Pre-Anesthesia Assessment:                           - Prior to the procedure, a History and Physical                            was performed, and patient medications and                            allergies were reviewed. The patient's tolerance of                            previous anesthesia was also reviewed. The risks                            and benefits of the procedure and the sedation                            options and risks were discussed with the patient.                            All questions were answered, and informed consent                            was obtained. Prior Anticoagulants: The patient has                            taken no previous anticoagulant or antiplatelet                            agents. ASA Grade Assessment: III - A patient with  severe systemic disease. After reviewing the risks                            and benefits, the patient was deemed in                            satisfactory condition to undergo the procedure.                           - Sedation was administered by an anesthesia                            professional. Deep sedation was attained.                           After obtaining  informed consent, the colonoscope                            was passed under direct vision. Throughout the                            procedure, the patient's blood pressure, pulse, and                            oxygen saturations were monitored continuously. The                            PCF-H190DL ([id number]) Olympus pediatric colonscope                            was introduced through the anus and advanced to the                            the cecum, identified by appendiceal orifice and                            ileocecal valve. The colonoscopy was somewhat                            difficult due to significant looping. Successful                            completion of the procedure was aided by using                            manual pressure and straightening and shortening                            the scope to obtain bowel loop reduction. The                            patient tolerated the procedure well. The quality  of the bowel preparation was good. The ileocecal                            valve, appendiceal orifice, and rectum were                            photographed. Scope In: 8:49:13 AM Scope Out: 9:09:19 AM Scope Withdrawal Time: 0 hours 12 minutes 19 seconds  Total Procedure Duration: 0 hours 20 minutes 6 seconds  Findings:      Scattered small and large-mouthed diverticula were found in the sigmoid       colon, descending colon and ascending colon.      The perianal exam findings include anal condyloma and perianal       condylomata.      The patient exhibited large anal condylomas externally and in the anal       canal. There were no suspicous lesions to suggest any malignancy. Impression:               - Diverticulosis in the sigmoid colon, in the                            descending colon and in the ascending colon.                           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                           - No specimens collected. Recommendation:           - Patient has a contact number available for                            emergencies. The signs and symptoms of potential                            delayed complications were discussed with the                            patient. Return to normal activities tomorrow.                            Written discharge instructions were provided to the                            patient.                           - Resume previous diet.                           - Continue present medications.                           - Repeat colonoscopy in  10 years for screening                            purposes.                           - Outpatient surgical evaluation for the anal                            condyloma. 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 All rights reserved. The codes documented in this report are preliminary and upon coder review may  be revised to meet current compliance requirements. Carol Ada, MD Carol Ada, MD 05/19/2020 9:21:27 AM This report has been signed electronically. Number of Addenda: 0

## 2020-05-19 NOTE — Progress Notes (Signed)
Patient being transported to endo at this time. No complaints.

## 2020-05-20 ENCOUNTER — Other Ambulatory Visit: Payer: Self-pay | Admitting: Physician Assistant

## 2020-05-20 ENCOUNTER — Encounter (HOSPITAL_COMMUNITY): Payer: Self-pay | Admitting: Gastroenterology

## 2020-05-20 LAB — CBC WITH DIFFERENTIAL/PLATELET
Abs Immature Granulocytes: 0.05 10*3/uL (ref 0.00–0.07)
Basophils Absolute: 0 10*3/uL (ref 0.0–0.1)
Basophils Relative: 0 %
Eosinophils Absolute: 0 10*3/uL (ref 0.0–0.5)
Eosinophils Relative: 1 %
HCT: 30.3 % — ABNORMAL LOW (ref 36.0–46.0)
Hemoglobin: 9.1 g/dL — ABNORMAL LOW (ref 12.0–15.0)
Immature Granulocytes: 1 %
Lymphocytes Relative: 11 %
Lymphs Abs: 0.6 10*3/uL — ABNORMAL LOW (ref 0.7–4.0)
MCH: 31 pg (ref 26.0–34.0)
MCHC: 30 g/dL (ref 30.0–36.0)
MCV: 103.1 fL — ABNORMAL HIGH (ref 80.0–100.0)
Monocytes Absolute: 0.2 10*3/uL (ref 0.1–1.0)
Monocytes Relative: 4 %
Neutro Abs: 5 10*3/uL (ref 1.7–7.7)
Neutrophils Relative %: 83 %
Platelets: 152 10*3/uL (ref 150–400)
RBC: 2.94 MIL/uL — ABNORMAL LOW (ref 3.87–5.11)
RDW: 20.3 % — ABNORMAL HIGH (ref 11.5–15.5)
WBC: 5.9 10*3/uL (ref 4.0–10.5)
nRBC: 0 % (ref 0.0–0.2)

## 2020-05-20 LAB — GLUCOSE, CAPILLARY
Glucose-Capillary: 83 mg/dL (ref 70–99)
Glucose-Capillary: 119 mg/dL — ABNORMAL HIGH (ref 70–99)
Glucose-Capillary: 160 mg/dL — ABNORMAL HIGH (ref 70–99)
Glucose-Capillary: 211 mg/dL — ABNORMAL HIGH (ref 70–99)
Glucose-Capillary: 198 mg/dL — ABNORMAL HIGH (ref 70–99)

## 2020-05-20 LAB — BASIC METABOLIC PANEL
Anion gap: 9 (ref 5–15)
BUN: 53 mg/dL — ABNORMAL HIGH (ref 8–23)
CO2: 29 mmol/L (ref 22–32)
Calcium: 9.7 mg/dL (ref 8.9–10.3)
Chloride: 99 mmol/L (ref 98–111)
Creatinine, Ser: 1.02 mg/dL — ABNORMAL HIGH (ref 0.44–1.00)
GFR calc Af Amer: >60 mL/min (ref >60)
GFR calc non Af Amer: 58 mL/min — ABNORMAL LOW (ref >60)
Glucose, Bld: 219 mg/dL — ABNORMAL HIGH (ref 70–99)
Potassium: 4.5 mmol/L (ref 3.5–5.1)
Sodium: 137 mmol/L (ref 135–145)

## 2020-05-20 LAB — SARS CORONAVIRUS 2 (TAT 6-24 HRS): SARS Coronavirus 2: NEGATIVE

## 2020-05-20 LAB — C-REACTIVE PROTEIN: CRP: 1.3 mg/dL — ABNORMAL HIGH (ref <1.0)

## 2020-05-20 LAB — BRAIN NATRIURETIC PEPTIDE: B Natriuretic Peptide: 167.5 pg/mL — ABNORMAL HIGH (ref 0.0–100.0)

## 2020-05-20 LAB — MAGNESIUM: Magnesium: 1.9 mg/dL (ref 1.7–2.4)

## 2020-05-20 MED ORDER — FUROSEMIDE 20 MG PO TABS
20.0000 mg | ORAL_TABLET | Freq: Once | ORAL | Status: AC
  Administered 2020-05-20: 20 mg via ORAL
  Filled 2020-05-20: qty 1

## 2020-05-20 NOTE — Progress Notes (Signed)
Physical Therapy Treatment Patient Details Name: April [name] MRN: [id number] DOB: 1956-02-15 Today's Date: 05/20/2020    History of Present Illness Patient is a 64 y/o female who presents with SOB and chest pain. Admitted with sepsis secondary to UTI and Acute on chronic hypoxic and hypercapnic respiratory failure secondary to acute pulmonary edema versus less likely atypical pneumonia. . CXR-pulmonary edema. PMH includes polycystic kidney disease s/p renal transplant (on prednisone, mycophenalate, prograf), COPD on 2L 02 baseline, diastolic CHF, chronic hypoxic respiratory failure, obesity, recurrent UTI.    PT Comments    Patient much more alert and following commands in timely manner. Able to progress to standing with RW (instead of Stedy) with marching and short distance forward/backward/sideways walking (with seated rests between). Very motivated to participate and has goal of ultimately returning home. Asked unit secretary to order an air cushion for her chair as she reports soreness at sacrum after sitting up over the weekend.      Follow Up Recommendations  SNF     Equipment Recommendations  None recommended by PT    Recommendations for Other Services       Precautions / Restrictions Precautions Precautions: Fall Precaution Comments: 2L O2 Restrictions LLE Weight Bearing: Weight bearing as tolerated    Mobility  Bed Mobility Overal bed mobility: Needs Assistance Bed Mobility: Sidelying to Sit;Rolling;Sit to Sidelying Rolling: Min guard (with rail) Sidelying to sit: Min guard;HOB elevated (with rail)     Sit to sidelying: Min assist (for legs) General bed mobility comments: HOB 20 degrees  Transfers Overall transfer level: Needs assistance Equipment used: Rolling walker (2 wheeled) Transfers: Sit to/from Stand Sit to Stand: Min assist;+2 safety/equipment         General transfer comment: stood x 3 from bed at lowest  height  Ambulation/Gait Ambulation/Gait assistance: Min assist;+2 safety/equipment Gait Distance (Feet): 4 Feet (marching x 30 sec; 2 fwd/backward; 2 sideways) Assistive device: Rolling walker (2 wheeled) Gait Pattern/deviations: Step-to pattern;Shuffle Gait velocity: decreased       Stairs             Wheelchair Mobility    Modified Rankin (Stroke Patients Only)       Balance Overall balance assessment: Needs assistance Sitting-balance support: Feet unsupported;No upper extremity supported Sitting balance-Leahy Scale: Good Sitting balance - Comments: Supervision at EOB due to lines   Standing balance support: Bilateral upper extremity supported Standing balance-Leahy Scale: Poor Standing balance comment: reliant on external support                            Cognition Arousal/Alertness: Awake/alert Behavior During Therapy: WFL for tasks assessed/performed Overall Cognitive Status: Within Functional Limits for tasks assessed (not specifically tested)                                        Exercises General Exercises - Lower Extremity Ankle Circles/Pumps: AROM;Both;10 reps Long Arc Quad: AROM;Both;5 reps;Seated Hip Flexion/Marching: AROM;Both;Seated;15 reps Other Exercises Other Exercises: pt verbalized understanding how to perform bridging exercise and reports she does that throughout the day    General Comments        Pertinent Vitals/Pain Pain Assessment: Faces Faces Pain Scale: Hurts little more Pain Location: sacral area Pain Descriptors / Indicators: Sore;Discomfort Pain Intervention(s): Limited activity within patient's tolerance;Repositioned (ordered air cushion for chair; return to bed in chair positi)  Home Living                      Prior Function            PT Goals (current goals can now be found in the care plan section) Acute Rehab PT Goals Patient Stated Goal: return to SNF, "dont want to be  a burden on my family" Time For Goal Achievement: 05/28/20 Potential to Achieve Goals: Fair Progress towards PT goals: Progressing toward goals    Frequency    Min 3X/week (very motivated & hopes to ultimately return home)      PT Plan Current plan remains appropriate;Frequency needs to be updated    Co-evaluation              AM-PAC PT "6 Clicks" Mobility   Outcome Measure  Help needed turning from your back to your side while in a flat bed without using bedrails?: A Little Help needed moving from lying on your back to sitting on the side of a flat bed without using bedrails?: A Lot Help needed moving to and from a bed to a chair (including a wheelchair)?: A Little Help needed standing up from a chair using your arms (e.g., wheelchair or bedside chair)?: A Little Help needed to walk in hospital room?: A Lot Help needed climbing 3-5 steps with a railing? : Total 6 Click Score: 14    End of Session Equipment Utilized During Treatment: Oxygen Activity Tolerance: Patient tolerated treatment well Patient left: in bed;with call bell/phone within reach;with bed alarm set;Other (comment) (in chair position for pressure relief of mattress ) Nurse Communication: Mobility status PT Visit Diagnosis: Other abnormalities of gait and mobility (R26.89);Repeated falls (R29.6);Muscle weakness (generalized) (M62.81);Pain Pain - part of body: Hip (ribs)     Time: 4193-7902 PT Time Calculation (min) (ACUTE ONLY): 41 min  Charges:  $Gait Training: 8-22 mins $Therapeutic Exercise: 23-37 mins                      Arby Barrette, PT Pager [phone]    April [name] 05/20/2020, 2:27 PM

## 2020-05-20 NOTE — Progress Notes (Signed)
PROGRESS NOTE                                                                                                                                                                                                             Patient Demographics:    April [name], is a 64 y.o. female, DOB - [birth year], YOV:[id number]  Admit date - 05/13/2020   Admitting Physician Lequita Halt, MD  Outpatient Primary MD for the patient is [name], MD  LOS - 7  Chief Complaint  Patient presents with  . Shortness of Breath  . Chest Pain       Brief Narrative - April [name] is a 64 y.o. female nursing home resident at [facility] with medical history significant of polycystic kidney disease s/p renal transplant (on prednisone, mycophenalate, prograf), COPD on 2L [place] baseline, diastolic CHF, chronic hypoxic resp failure, obesity, HTN, pulmonary HTN, recurrent UTI, presented to ED withSOB wheezingand fever with no clear source, she has recently been admitted 3-4 times in the last few weeks for similar problems.  She states that about 6 months ago she had a mechanical fall with left-sided hip fracture and since then she has been able to go home and has been in a nursing home.  Her main complaint was gradually progressive shortness of breath, fatigue and low-grade fevers.   Subjective:   Patient in bed, appears comfortable, denies any headache, no fever, no chest pain or pressure, no shortness of breath , no abdominal pain. No focal weakness.    Assessment  & Plan :     1.  Sepsis with leukocytosis, elevated procalcitonin and CRP.  In a patient with kidney transplant, on chronic immunosuppressive therapy with no clear-cut source.  At this time she has had multiple similar admissions in the recent past and no convincing source has been found, blood cultures -ve, she was initially on IV vancomycin and cefepime transition to cefepime only on 05/15/2020, now she has finished her antibiotics.     Recent echo from few weeks ago reviewed without any hint of vegetation, CT chest abdomen pelvis remains unrevealing, unremarkable V/Q and leg venous ultrasound, MRI of the sacrum does not show any abscess or osteomyelitis around the sacral wound,-ve CMV titers.  Sepsis pathophysiology has resolved, he has also been seen by ID but no specific source for this infection could be identified.  Thankfully she is finished on antibiotics, will monitor another day if no fever discharge tomorrow.   Recent Labs  Lab 05/13/20 1104 05/13/20 1603 05/14/20 0243 05/14/20 0827 05/15/20 0348 05/15/20 0348 05/16/20 0350 05/17/20 1021 05/18/20 0223 05/19/20 0412 05/20/20 0316  WBC  --    < >   < >  --  8.3   < > 8.4 6.0 5.0 5.7 5.9  PLT  --    < >   < >  --  149*   < > 144* 139* 122* 143* 152  CRP  --   --    < > 27.0* 17.7*   < > 8.5* 2.6* 1.4* 0.6 1.3*  DDIMER  --   --   --  2.75* 1.47*  --  1.00* 1.03* 0.68*  --   --   PROCALCITON  --    < >   < >  --  3.96  --  4.17 2.01 1.12 0.47  --   LATICACIDVEN 0.9  --   --  1.1  --   --   --   --   --   --   --    < > = values in this interval not displayed.    2.  History of renal transplant in 2013 due to underlying polycystic kidney disease.  Case discussed with nephrologist Dr. Vanetta Mulders on 05/14/2020, okay for IV contrast with hydration, has been hydrated adequately, discontinue Myfortic as suggested by nephrology, IV steroids have been tapered off and now on 20 mg oral prednisone, gradually taper down to home dose.  Note Lasix to be dosed daily.  3.  History of SVT. verapamil dose adjusted further.  4.  History of pulmonary hypertension.  Supportive care.  5.  Chronic grade 2 diastolic dysfunction with EF 55%.  Currently compensated clinically, BNP elevated likely due to pulmonary hypertension. She is on Lasix at home, we are dosing it daily here.  6.  Chronic iron deficiency anemia with mixed picture with low iron reserves but elevated MCV.   Oral iron supplementation, no source of ongoing active bleeding however H&H dropped further on 05/15/2020 due to heme dilution, 1 unit packed RBC on 05/15/2020.  New PPI, seen by GI underwent EGD and colonoscopy which were both nonacute with colonoscopy showing few diverticulosis and anal condyloma.  Follow with GI outpatient Dr. Benson Norway.  7.  Trace leg edema due to malnutrition.  Protein supplements.  Resumed Lasix.  8.  Borderline elevated D-dimer.  Due to inflammation improved after hydration, on prophylactic dose Lovenox, VQ scan and leg venous ultrasound negative.  9.  Hyponatremia.  Was due to dehydration improved after IV fluids.   10.  AKI.  Likely due to sepsis and hypotension, continue to hydrate and avoid nephrotoxins, monitor closely.   11.  Urinary retention.  Foley placed on 05/14/2020.  Foley removed on 05/17/2020.  Voiding well for now.  12. Small unstageable sacrococcygeal decubitus ulcer which is chronic.  Wound care following.  Appears noninfected.  See wound care note for details.  MRI being done to rule out any deep-seated infection.  Pressure Injury 04/29/20 Coccyx Mid Stage 3 -  Full thickness tissue loss. Subcutaneous fat may be visible but bone, tendon or muscle are NOT exposed. (Active)  04/29/20 2130  Location: Coccyx  Location Orientation: Mid  Staging: Stage 3 -  Full thickness tissue loss. Subcutaneous fat may be visible but bone, tendon or muscle are NOT exposed.  Wound Description (Comments):   Present on Admission: Yes      Condition - Extremely Guarded  Family Communication  :  Left for husband April [name] [phone] on 05/14/2020 at 13:14 PM  Daughter April [name] [phone] on 02/10/2020 at 11:36 AM.   Code Status :  Full  Consults  :  PCCM, Renal Dr. Moshe Cipro over the phone, ID, GI  Procedures  :    EGD and colonoscopy on 05/19/2020 by Dr. Carol Ada.  Mild gastritis, diverticulosis, anal condyloma.  MRI - pelvis- no sacral abscess or  osteomyelitis.  VQ - NML  CT chest abdomen pelvis - 1. No acute intra-abdominal or pelvic pathology. 2. Polycystic kidney disease with a right lower quadrant renal transplant. No hydronephrosis or nephrolithiasis. 3. No bowel obstruction. Normal appendix. 4. Small bilateral pleural effusions with associated bibasilar atelectasis. Please see report for chest CT. 5. Aortic Atherosclerosis (ICD10-I70.0).  No acute findings in the chest except changes suggestive of pulmonary hypertension.  Mild bilateral pleural effusions with compressive atelectasis.   Bilateral leg venous ultrasound.  No DVT.     TTE. 04/17/20 -  1. Left ventricular ejection fraction, by estimation, is 55 to 60%. The left ventricle has normal function. The left ventricle has no regional wall motion abnormalities. Left ventricular diastolic parameters are consistent with Grade II diastolic dysfunction (pseudonormalization). Elevated left atrial pressure.  2. Right ventricular systolic function is normal. The right ventricular size is normal. There is mildly elevated pulmonary artery systolic pressure. The estimated right ventricular systolic pressure is 76.1 mmHg.  3. Left atrial size was mildly dilated.  4. The mitral valve is degenerative. Trivial mitral valve regurgitation. No evidence of mitral stenosis.  5. The aortic valve is tricuspid. Aortic valve regurgitation is not visualized. No aortic stenosis is present.  6. The inferior vena cava is dilated in size with <50% respiratory variability, suggesting right atrial pressure of 15 mmHg. Comparison(s): No significant change from prior study.   PUD Prophylaxis : PPI  Disposition Plan  :    Status is: Inpatient  Remains inpatient appropriate because:IV treatments appropriate due to intensity of illness or inability to take PO   Dispo: The patient is from: SNF              Anticipated d/c is to: SNF              Anticipated d/c date is: > 1-2 days              Patient  currently is not medically stable to d/c.   DVT Prophylaxis  :  Lovenox    Lab Results  Component Value Date   PLT 152 05/20/2020    Diet :  Diet Order            Diet regular Room service appropriate? Yes; Fluid consistency: Thin  Diet effective now                  Inpatient Medications Scheduled Meds: . allopurinol  300 mg Oral Daily  . ascorbic acid  500 mg Oral Daily  . aspirin  81 mg Oral Daily  . brimonidine  1 drop Both Eyes BID  . Chlorhexidine Gluconate Cloth  6 each Topical Daily  . cholecalciferol  1,000 Units Oral Daily  . collagenase  1 application Topical Daily  . cycloSPORINE  1 drop Both Eyes BID  . docusate sodium  100 mg Oral BID  . dorzolamide  1 drop Both Eyes Daily  . enoxaparin (LOVENOX) injection  40 mg Subcutaneous Q24H  . feeding supplement (ENSURE ENLIVE)  237 mL Oral BID BM  . feeding supplement (PRO-STAT  SUGAR FREE 64)  30 mL Oral BID WC  . ferrous sulfate  325 mg Oral BID WC  . FLUoxetine  40 mg Oral Daily  . gabapentin  100 mg Oral TID  . guaiFENesin  600 mg Oral BID  . insulin aspart  0-9 Units Subcutaneous TID WC  . isosorbide mononitrate  60 mg Oral Daily  . latanoprost  1 drop Both Eyes QHS  . magnesium oxide  400 mg Oral BID  . multivitamin with minerals  1 tablet Oral Daily  . pantoprazole  40 mg Oral BID  . predniSONE  20 mg Oral Q breakfast  . senna-docusate  1 tablet Oral Daily  . tacrolimus  0.5 mg Oral QHS  . tacrolimus  1 mg Oral Daily  . valACYclovir  500 mg Oral BID  . verapamil  180 mg Oral Daily  . vitamin B-12  1,000 mcg Oral Daily   Continuous Infusions:  PRN Meds:.acetaminophen **OR** [DISCONTINUED] acetaminophen, ipratropium-albuterol, levalbuterol, [DISCONTINUED] ondansetron **OR** ondansetron (ZOFRAN) IV  Antibiotics  :   Anti-infectives (From admission, onward)   Start     Dose/Rate Route Frequency Ordered Stop   05/14/20 0200  vancomycin (VANCOREADY) IVPB 750 mg/150 mL  Status:  Discontinued        750  mg 150 mL/hr over 60 Minutes Intravenous Every 12 hours 05/13/20 1239 05/15/20 1522   05/14/20 0000  ceFEPIme (MAXIPIME) 2 g in sodium chloride 0.9 % 100 mL IVPB        2 g 200 mL/hr over 30 Minutes Intravenous Every 12 hours 05/13/20 1239 05/17/20 1300   05/13/20 1500  valACYclovir (VALTREX) tablet 500 mg     Discontinue     500 mg Oral 2 times daily 05/13/20 1420     05/13/20 1115  ceFEPIme (MAXIPIME) 2 g in sodium chloride 0.9 % 100 mL IVPB        2 g 200 mL/hr over 30 Minutes Intravenous  Once 05/13/20 1105 05/13/20 1310   05/13/20 1115  metroNIDAZOLE (FLAGYL) IVPB 500 mg        500 mg 100 mL/hr over 60 Minutes Intravenous  Once 05/13/20 1105 05/13/20 1311   05/13/20 1115  vancomycin (VANCOCIN) IVPB 1000 mg/200 mL premix  Status:  Discontinued        1,000 mg 200 mL/hr over 60 Minutes Intravenous  Once 05/13/20 1105 05/13/20 1107   05/13/20 1115  vancomycin (VANCOREADY) IVPB 1500 mg/300 mL        1,500 mg 150 mL/hr over 120 Minutes Intravenous  Once 05/13/20 1107 05/13/20 1612          Objective:   Vitals:   05/19/20 1652 05/19/20 1935 05/20/20 0434 05/20/20 0800  BP: 139/68 126/62 138/70   Pulse:  86 79   Resp: 18 19 17    Temp: 98.2 F (36.8 C) 97.9 F (36.6 C) 98.7 F (37.1 C) 99 F (37.2 C)  TempSrc: Oral Oral Axillary Oral  SpO2: 98% 99% 100%   Weight:      Height:        SpO2: 100 % O2 Flow Rate (L/min): 2 L/min FiO2 (%): 40 %  Wt Readings from Last 3 Encounters:  05/16/20 74.6 kg  05/02/20 74.4 kg  04/19/20 73.2 kg     Intake/Output Summary (Last 24 hours) at 05/20/2020 1043 Last data filed at 05/20/2020 0900 Gross per 24 hour  Intake 720 ml  Output 1150 ml  Net -430 ml     Physical Exam  Awake  Alert, No new F.N deficits, Normal affect [place].AT,PERRAL Supple Neck,No JVD, No cervical lymphadenopathy appriciated.  Symmetrical Chest wall movement, Good air movement bilaterally, CTAB RRR,No Gallops, Rubs or new Murmurs, No Parasternal Heave +ve  B.Sounds, Abd Soft, No tenderness, No organomegaly appriciated, No rebound - guarding or rigidity. No Cyanosis, Clubbing or edema, No new Rash or bruise   Pressure Injury 04/29/20 Coccyx Mid Stage 3 -  Full thickness tissue loss. Subcutaneous fat may be visible but bone, tendon or muscle are NOT exposed. (Active)  04/29/20 2130  Location: Coccyx  Location Orientation: Mid  Staging: Stage 3 -  Full thickness tissue loss. Subcutaneous fat may be visible but bone, tendon or muscle are NOT exposed.  Wound Description (Comments):   Present on Admission: Yes     Data Review:    Recent Labs  Lab 05/16/20 0350 05/17/20 1021 05/18/20 0223 05/19/20 0412 05/20/20 0316  WBC 8.4 6.0 5.0 5.7 5.9  HGB 7.9* 8.5* 8.0* 9.0* 9.1*  HCT 26.3* 29.2* 27.5* 29.9* 30.3*  PLT 144* 139* 122* 143* 152  MCV 102.7* 103.5* 102.2* 101.7* 103.1*  MCH 30.9 30.1 29.7 30.6 31.0  MCHC 30.0 29.1* 29.1* 30.1 30.0  RDW 21.8* 20.9* 20.0* 20.0* 20.3*  LYMPHSABS 0.3* 0.7 0.5* 0.9 0.6*  MONOABS 0.1 0.3 0.3 0.3 0.2  EOSABS 0.0 0.0 0.0 0.0 0.0  BASOSABS 0.0 0.0 0.0 0.0 0.0    Recent Labs  Lab 05/13/20 1044 05/13/20 1129 05/13/20 1148 05/13/20 1603 05/14/20 0827 05/14/20 0827 05/15/20 0348 05/15/20 0348 05/16/20 0350 05/17/20 1021 05/18/20 0223 05/19/20 0412 05/20/20 0316  NA 136   < >  --    < >  --   --  128*   < > 135 140 138 138 137  K 4.3   < >  --    < >  --   --  3.4*   < > 5.1 4.4 4.5 4.0 4.5  CL 101  --   --    < >  --   --  95*   < > 104 104 102 100 99  CO2 25  --   --    < >  --   --  19*   < > 21* 26 28 29 29   GLUCOSE 129*  --   --    < >  --   --  216*   < > 256* 120* 121* 84 219*  BUN 22  --   --    < >  --   --  45*   < > 69* 77* 76* 59* 53*  CREATININE 0.90  --   --    < >  --   --  0.93   < > 1.35* 1.25* 1.13* 0.94 1.02*  CALCIUM 9.6  --   --    < >  --   --  7.7*   < > 9.6 10.1 10.0 10.1 9.7  AST 22  --   --   --   --   --  14*  --  16 17 22   --   --   ALT 18  --   --   --   --   --   14  --  14 14 18   --   --   ALKPHOS 94  --   --   --   --   --  54  --  61 61 58  --   --  BILITOT 1.3*  --   --   --   --   --  3.9*  --  0.8 0.9 0.8  --   --   ALBUMIN 2.8*  --   --   --   --   --  1.8*  --  2.2* 2.4* 2.4*  --   --   MG  --   --   --   --  1.5*   < > 2.0   < > 2.5* 2.6* 2.3 2.0 1.9  CRP  --   --   --   --  27.0*   < > 17.7*   < > 8.5* 2.6* 1.4* 0.6 1.3*  DDIMER  --   --   --   --  2.75*  --  1.47*  --  1.00* 1.03* 0.68*  --   --   PROCALCITON  --   --   --    < >  --   --  3.96  --  4.17 2.01 1.12 0.47  --   INR  --   --  1.2  --   --   --   --   --   --   --   --   --   --   BNP  --   --   --    < > 468.5*   < > 534.1*   < > 608.9* 886.6* 569.2* 269.2* 167.5*   < > = values in this interval not displayed.    Recent Labs  Lab 05/13/20 1353 05/13/20 1603 05/14/20 0827 05/14/20 0827 05/15/20 0348 05/15/20 0348 05/16/20 0350 05/17/20 1021 05/18/20 0223 05/19/20 0412 05/20/20 0316  CRP  --   --  27.0*   < > 17.7*   < > 8.5* 2.6* 1.4* 0.6 1.3*  DDIMER  --   --  2.75*  --  1.47*  --  1.00* 1.03* 0.68*  --   --   BNP  --    < > 468.5*   < > 534.1*   < > 608.9* 886.6* 569.2* 269.2* 167.5*  PROCALCITON  --    < >  --   --  3.96  --  4.17 2.01 1.12 0.47  --   SARSCOV2NAA NEGATIVE  --   --   --   --   --   --   --   --   --   --    < > = values in this interval not displayed.    ------------------------------------------------------------------------------------------------------------------ No results for input(s): CHOL, HDL, LDLCALC, TRIG, CHOLHDL, LDLDIRECT in the last 72 hours.  Lab Results  Component Value Date   HGBA1C 5.9 (H) 04/29/2020   ------------------------------------------------------------------------------------------------------------------ No results for input(s): TSH, T4TOTAL, T3FREE, THYROIDAB in the last 72 hours.  Invalid input(s):  FREET3 ------------------------------------------------------------------------------------------------------------------ No results for input(s): VITAMINB12, FOLATE, FERRITIN, TIBC, IRON, RETICCTPCT in the last 72 hours.  Coagulation profile Recent Labs  Lab 05/13/20 1148  INR 1.2    Recent Labs    05/18/20 0223  DDIMER 0.68*    Cardiac Enzymes No results for input(s): CKMB, TROPONINI, MYOGLOBIN in the last 168 hours.  Invalid input(s): CK ------------------------------------------------------------------------------------------------------------------    Component Value Date/Time   BNP 167.5 (H) 05/20/2020 0316    Micro Results Recent Results (from the past 240 hour(s))  Blood Culture (routine x 2)     Status: None   Collection Time: 05/13/20 10:58 AM   Specimen: BLOOD LEFT HAND  Result Value Ref Range Status   Specimen Description BLOOD LEFT HAND  Final   Special Requests   Final    BOTTLES DRAWN AEROBIC AND ANAEROBIC Blood Culture adequate volume   Culture   Final    NO GROWTH 5 DAYS Performed at [facility], [address] [place], [place] [address]    Report Status 05/18/2020 FINAL  Final  Blood Culture (routine x 2)     Status: None   Collection Time: 05/13/20 11:48 AM   Specimen: BLOOD  Result Value Ref Range Status   Specimen Description BLOOD RIGHT ANTECUBITAL  Final   Special Requests   Final    BOTTLES DRAWN AEROBIC AND ANAEROBIC Blood Culture adequate volume   Culture   Final    NO GROWTH 5 DAYS Performed at [facility], [place] [address] [place], [place] [address]    Report Status 05/18/2020 FINAL  Final  SARS Coronavirus 2 by RT PCR (hospital order, performed in [facility] hospital lab) Nasopharyngeal Nasopharyngeal Swab     Status: None   Collection Time: 05/13/20  1:53 PM   Specimen: Nasopharyngeal Swab  Result Value Ref Range Status   SARS Coronavirus 2 NEGATIVE NEGATIVE Final    Comment: (NOTE) SARS-CoV-2 target nucleic acids  are NOT DETECTED.  The SARS-CoV-2 RNA is generally detectable in upper and lower respiratory specimens during the acute phase of infection. The lowest concentration of SARS-CoV-2 viral copies this assay can detect is 250 copies / mL. A negative result does not preclude SARS-CoV-2 infection and should not be used as the sole basis for treatment or other patient management decisions.  A negative result may occur with improper specimen collection / handling, submission of specimen other than nasopharyngeal swab, presence of viral mutation(s) within the areas targeted by this assay, and inadequate number of viral copies (<250 copies / mL). A negative result must be combined with clinical observations, patient history, and epidemiological information.  Fact Sheet for Patients:   StrictlyIdeas.no  Fact Sheet for Healthcare Providers: BankingDealers.co.za  This test is not yet approved or  cleared by the [place] FDA and has been authorized for detection and/or diagnosis of SARS-CoV-2 by FDA under an Emergency Use Authorization (EUA).  This EUA will remain in effect (meaning this test can be used) for the duration of the COVID-19 declaration under Section 564(b)(1) of the Act, 21 U.S.C. section 360bbb-3(b)(1), unless the authorization is terminated or revoked sooner.  Performed at [facility], [place] [address] [place], [place] [address]   Urine culture     Status: None   Collection Time: 05/13/20  3:12 PM   Specimen: In/Out Cath Urine  Result Value Ref Range Status   Specimen Description IN/OUT CATH URINE  Final   Special Requests NONE  Final   Culture   Final    NO GROWTH Performed at [facility], [place] [address], [place],  [address]    Report Status 05/14/2020 FINAL  Final  MRSA PCR Screening     Status: None   Collection Time: 05/14/20  4:46 AM   Specimen: Nasal Mucosa; Nasopharyngeal  Result Value Ref  Range Status   MRSA by PCR NEGATIVE NEGATIVE Final    Comment:        The GeneXpert MRSA Assay (FDA approved for NASAL specimens only), is one component of a comprehensive MRSA colonization surveillance program. It is not intended to diagnose MRSA infection nor to guide or monitor treatment  for MRSA infections. Performed at [facility], [place] [address] [place], [place] [address]     Radiology Reports CT CHEST WO CONTRAST  Result Date: 05/14/2020 CLINICAL DATA:  64 year old female with shortness of breath and chest pain. EXAM: CT CHEST WITHOUT CONTRAST TECHNIQUE: Multidetector CT imaging of the chest was performed following the standard protocol without IV contrast. COMPARISON:  Chest radiograph dated 04/29/2020. FINDINGS: Evaluation of this exam is limited in the absence of intravenous contrast. Cardiovascular: There is mild cardiomegaly. No pericardial effusion. Coronary vascular calcification. There is mild atherosclerotic calcification of the thoracic aorta. There is mild dilatation of the main pulmonary trunk suggestive of pulmonary hypertension. Clinical correlation is recommended. Evaluation of the vasculature is limited in the absence of intravenous contrast. Mediastinum/Nodes: No hilar or mediastinal adenopathy. The esophagus and the thyroid gland are grossly unremarkable. No mediastinal fluid collection. Lungs/Pleura: Small bilateral pleural effusions similar to prior CT with associated partial compressive atelectasis of the lower lobes. Pneumonia is not excluded. Clinical correlation is recommended. There are bibasilar linear atelectasis/scarring. No pneumothorax. The central airways are patent. Upper Abdomen: Please see the report for the accompanying CT abdomen pelvis. Musculoskeletal: No chest wall mass or suspicious bone lesions identified. IMPRESSION: 1. Small bilateral pleural effusions with associated partial compressive atelectasis of the lower lobes. Pneumonia is not  excluded. Clinical correlation is recommended. 2. Mild cardiomegaly with coronary vascular calcification. 3. Mildly dilated main pulmonary trunk suggestive of pulmonary hypertension. Clinical correlation is recommended. 4. Aortic Atherosclerosis (ICD10-I70.0). Electronically Signed   By: Anner Crete M.D.   On: 05/14/2020 21:51   CT Chest Wo Contrast  Result Date: 04/29/2020 CLINICAL DATA:  Pneumonia. Pleural effusion or abscess suspected. Follow-up to current chest radiograph. EXAM: CT CHEST WITHOUT CONTRAST TECHNIQUE: Multidetector CT imaging of the chest was performed following the standard protocol without IV contrast. COMPARISON:  Current chest radiograph.  CT chest dated 04/15/2020. FINDINGS: Cardiovascular: Heart is mildly enlarged. No pericardial effusion. Minimal left coronary artery calcifications. Aorta is normal in caliber. Minor atherosclerotic calcifications along the descending portion. Main pulmonary artery is dilated to 3.8 cm. Mediastinum/Nodes: No neck base, mediastinal or hilar masses or enlarged lymph nodes. Trachea and esophagus are unremarkable. Lungs/Pleura: Small right and trace left pleural effusions. Mild dependent atelectasis noted in both lower lobes and minimally at the diaphragmatic bases of the left upper lobe lingula right middle lobe and dependent upper lobes. Minor linear atelectasis in the left upper lobe. No convincing pneumonia and no evidence of pulmonary edema. No mass or suspicious nodule. No pneumothorax. Upper Abdomen: Multiple liver and bilateral renal cysts consistent with polycystic kidney disease. Enlarged spleen, 14.5 cm greatest transverse dimension. No acute findings in the visualized upper abdomen and no change from the prior CT. Musculoskeletal: Multiple old bilateral rib fractures. No acute fractures. No osteoblastic or osteolytic lesions. IMPRESSION: 1. Small right and trace left pleural effusions. There is associated atelectasis, but no convincing  pneumonia and no evidence of pulmonary edema. No abscess. The lung appearance is similar to the previous chest CT. 2. Mild cardiomegaly. 3. Enlarged main pulmonary artery suggesting pulmonary artery hypertension. 4. Stable changes polycystic kidney disease including multiple liver cysts. Stable splenomegaly. Aortic Atherosclerosis (ICD10-I70.0). Electronically Signed   By: Lajean Manes M.D.   On: 04/29/2020 16:57   NM Pulmonary Perfusion  Result Date: 05/14/2020 CLINICAL DATA:  Hypoxemia with elevated D-dimer. EXAM: NUCLEAR MEDICINE PERFUSION LUNG SCAN TECHNIQUE: Perfusion images were obtained in multiple projections after intravenous injection of radiopharmaceutical. Ventilation  scans intentionally deferred if perfusion scan and chest x-ray adequate for interpretation during COVID 19 epidemic. RADIOPHARMACEUTICALS:  1.5 mCi Tc-80m MAA IV COMPARISON:  None. FINDINGS: There is normal homogeneous distribution of tracer activity seen throughout both lungs. No segmental or subsegmental perfusion defects are identified. IMPRESSION: Normal nuclear medicine perfusion lung scan. Electronically Signed   By: Virgina Norfolk M.D.   On: 05/14/2020 19:46   CT ABDOMEN PELVIS W CONTRAST  Result Date: 05/14/2020 CLINICAL DATA:  64 year old female with abdominal pain. EXAM: CT ABDOMEN AND PELVIS WITH CONTRAST TECHNIQUE: Multidetector CT imaging of the abdomen and pelvis was performed using the standard protocol following bolus administration of intravenous contrast. CONTRAST:  181mL OMNIPAQUE IOHEXOL 300 MG/ML  SOLN COMPARISON:  CT abdomen pelvis dated 04/15/2020. FINDINGS: Lower chest: Partially visualized small bilateral pleural effusions with associated bibasilar atelectasis. Please see report for chest CT. No intra-abdominal free air or free fluid. Hepatobiliary: Multiple hepatic hypodense lesions measure up to 3 cm. The larger lesions demonstrate fluid attenuation most consistent with cysts and smaller lesions are  not well characterized. No calcified gallstone. There is a small pericholecystic fluid. Pancreas: Unremarkable. No pancreatic ductal dilatation or surrounding inflammatory changes. Spleen: Top-normal spleen size measuring up to 14 cm in craniocaudal length. Adrenals/Urinary Tract: The adrenal glands unremarkable. Innumerable bilateral renal cysts essentially replacing the renal parenchyma consistent with polycystic kidney disease. There is no hydronephrosis on either side. There is a right lower quadrant renal transplant. There is no hydronephrosis or nephrolithiasis of the transplant kidney. No peritransplant fluid collection. The urinary bladder is decompressed around a Foley catheter. Stomach/Bowel: There is moderate stool throughout the colon. There is no bowel obstruction or active inflammation. The appendix is normal. Vascular/Lymphatic: Mild aortoiliac atherosclerotic disease. The IVC is unremarkable. No portal venous gas. There is no adenopathy. Reproductive: The uterus is retroflexed. No adnexal masses. Other: Small fat containing umbilical hernia. Mild diffuse subcutaneous edema. Musculoskeletal: Osteopenia with degenerative changes of the spine. Total left hip arthroplasty. Old left pubic bone fracture as well as old sacral fractures. No acute osseous pathology. IMPRESSION: 1. No acute intra-abdominal or pelvic pathology. 2. Polycystic kidney disease with a right lower quadrant renal transplant. No hydronephrosis or nephrolithiasis. 3. No bowel obstruction. Normal appendix. 4. Small bilateral pleural effusions with associated bibasilar atelectasis. Please see report for chest CT. 5. Aortic Atherosclerosis (ICD10-I70.0). Electronically Signed   By: Anner Crete M.D.   On: 05/14/2020 21:25   MR SACRUM SI JOINTS WO CONTRAST  Result Date: 05/15/2020 CLINICAL DATA:  Chronic sacral wound with recurrent fevers. Evaluate for osteomyelitis. EXAM: MRI SACRUM WITHOUT CONTRAST TECHNIQUE: Multiplanar,  multisequence MR imaging of the sacrum was performed. No intravenous contrast was administered. COMPARISON:  CT abdomen pelvis from yesterday. FINDINGS: Bones/Joint/Cartilage No suspicious marrow signal abnormality. No acute fracture or dislocation. Chronic bilateral sacral ala and S2 fractures. Prior left total hip arthroplasty with associated susceptibility artifact. Muscles and Tendons Bilateral piriformis, gluteal, and adductor muscle edema and atrophy. Soft tissue Left-sided sacrococcygeal ulcer. No fluid collection or hematoma. No soft tissue mass. Trace free fluid in the pelvis.  Fibroid uterus. IMPRESSION: 1. Left-sided sacrococcygeal ulcer without evidence of osteomyelitis or abscess. Electronically Signed   By: Titus Dubin M.D.   On: 05/15/2020 16:49   DG Chest Port 1 View  Result Date: 05/16/2020 CLINICAL DATA:  Shortness of breath. EXAM: PORTABLE CHEST 1 VIEW COMPARISON:  One-view chest x-ray 05/14/2020. CT chest without contrast 05/14/2020. FINDINGS: The a heart size is normal. Mild pulmonary  vascular congestion is noted. Small right pleural effusion and atelectasis is again noted. No new airspace disease is present. IMPRESSION: 1. Stable appearance of small right pleural effusion and atelectasis. 2. Mild pulmonary vascular congestion. Electronically Signed   By: San Morelle M.D.   On: 05/16/2020 08:06   Portable chest 1 View  Result Date: 05/14/2020 CLINICAL DATA:  Shortness of breath EXAM: PORTABLE CHEST 1 VIEW COMPARISON:  04/23/2020 FINDINGS: Cardiac shadow is mildly prominent but stable accentuated by the portable technique. Overall inspiratory effort is again poor although previously seen atelectasis has improved. No focal confluent infiltrate or sizable effusion is noted. No bony abnormality is seen. IMPRESSION: No acute abnormality noted. Electronically Signed   By: Inez Catalina M.D.   On: 05/14/2020 09:28   DG Chest Portable 1 View  Result Date: 05/13/2020 CLINICAL  DATA:  Short of breath chest pain EXAM: PORTABLE CHEST 1 VIEW COMPARISON:  04/29/2020 FINDINGS: Hypoventilation with decreased lung volume. Mild bibasilar atelectasis similar to the prior study Negative for heart failure or pneumonia. Small right pleural effusion. IMPRESSION: Hypoventilation with bibasilar atelectasis unchanged from the prior study Electronically Signed   By: Franchot Gallo M.D.   On: 05/13/2020 10:54   DG Chest Port 1 View  Result Date: 04/29/2020 CLINICAL DATA:  Shortness of breath EXAM: PORTABLE CHEST 1 VIEW COMPARISON:  Apr 16, 2020 FINDINGS: There is atelectatic change in the lower lung zones bilaterally, stable. No edema or airspace opacity. Heart is mildly enlarged. The pulmonary vascularity is within normal limits. No adenopathy. No bone lesions. IMPRESSION: Mild bibasilar atelectasis. No edema or airspace opacity. Heart mildly enlarged with pulmonary vascularity within normal limits. No evident adenopathy. Electronically Signed   By: Lowella Grip III M.D.   On: 04/29/2020 14:39   VAS [place] LOWER EXTREMITY VENOUS (DVT)  Result Date: 05/13/2020  Lower Venous DVTStudy Indications: SOB, and sepsis.  Comparison Study: 12/16/19, 02/02/20, 03/16/20 all negative for DVT Performing Technologist: June Leap RDMS, RVT  Examination Guidelines: A complete evaluation includes B-mode imaging, spectral Doppler, color Doppler, and power Doppler as needed of all accessible portions of each vessel. Bilateral testing is considered an integral part of a complete examination. Limited examinations for reoccurring indications may be performed as noted. The reflux portion of the exam is performed with the patient in reverse Trendelenburg.  +---------+---------------+---------+-----------+----------+--------------+ RIGHT    CompressibilityPhasicitySpontaneityPropertiesThrombus Aging +---------+---------------+---------+-----------+----------+--------------+ CFV      Full           Yes      Yes                                  +---------+---------------+---------+-----------+----------+--------------+ SFJ      Full                                                        +---------+---------------+---------+-----------+----------+--------------+ FV Prox  Full                                                        +---------+---------------+---------+-----------+----------+--------------+ FV Mid   Full                                                        +---------+---------------+---------+-----------+----------+--------------+  FV DistalFull                                                        +---------+---------------+---------+-----------+----------+--------------+ PFV      Full                                                        +---------+---------------+---------+-----------+----------+--------------+ POP      Full           Yes      Yes                                 +---------+---------------+---------+-----------+----------+--------------+ PTV      Full                                                        +---------+---------------+---------+-----------+----------+--------------+ PERO     Full                                                        +---------+---------------+---------+-----------+----------+--------------+   +---------+---------------+---------+-----------+----------+--------------+ LEFT     CompressibilityPhasicitySpontaneityPropertiesThrombus Aging +---------+---------------+---------+-----------+----------+--------------+ CFV      Full           Yes      Yes                                 +---------+---------------+---------+-----------+----------+--------------+ SFJ      Full                                                        +---------+---------------+---------+-----------+----------+--------------+ FV Prox  Full                                                         +---------+---------------+---------+-----------+----------+--------------+ FV Mid   Full                                                        +---------+---------------+---------+-----------+----------+--------------+ FV DistalFull                                                        +---------+---------------+---------+-----------+----------+--------------+   PFV      Full                                                        +---------+---------------+---------+-----------+----------+--------------+ POP      Full           Yes      Yes                                 +---------+---------------+---------+-----------+----------+--------------+ PTV      Full                                                        +---------+---------------+---------+-----------+----------+--------------+ PERO     Full                                                        +---------+---------------+---------+-----------+----------+--------------+     Summary: BILATERAL: - No evidence of deep vein thrombosis seen in the lower extremities, bilaterally. -No evidence of popliteal cyst, bilaterally.   *See table(s) above for measurements and observations. Electronically signed by Ruta Hinds MD on 05/13/2020 at 3:35:07 PM.    Final     Time Spent in minutes  30   Lala Lund M.D on 05/20/2020 at 10:43 AM  To page go to [email or web address] - password [facility]

## 2020-05-20 NOTE — TOC Progression Note (Signed)
Transition of Care [facility]) - Progression Note    Patient Details  Name: AMARIAH KIERSTEAD MRN: [id number] Date of Birth: March 07, 1956  [facility]) CM/SW [place], LCSW Phone Number: 05/20/2020, 11:11 AM  Clinical Narrative:    CSW submitted clinicals to insurance. Will fax updated PT note today once patient is seen. Requested updated COVID test from MD.    Expected Discharge Plan: [place] Barriers to Discharge: Ship broker, Continued Medical Work up  Expected Discharge Plan and Services Expected Discharge Plan: [place] In-house Referral: Clinical Social Work   Post Acute Care Choice: [place] Living arrangements for the past 2 months: [place]                                       Social Determinants of Health (SDOH) Interventions    Readmission Risk Interventions Readmission Risk Prevention Plan 05/15/2020 04/19/2020 02/09/2020  Transportation Screening Complete Complete Complete  PCP or Specialist Appt within 3-5 Days - - -  Not Complete comments - - -  HRI or [place] or Home Care Consult comments - - -  Social Work Scientific laboratory technician for [place] Planning/Counseling - - -  SW consult not completed comments - - -  Palliative Care Screening - - -  Medication Review ([place]) Referral to Pharmacy Complete Referral to Pharmacy  PCP or Specialist appointment within 3-5 days of discharge Complete Complete Complete  HRI or Home Care Consult Complete Complete Complete  SW Recovery Care/Counseling Consult Complete Complete -  Palliative Care Screening Not Applicable Not Applicable Complete  Skilled Nursing Facility Complete Complete Patient Refused  Some recent data might be hidden

## 2020-05-21 LAB — GLUCOSE, CAPILLARY
Glucose-Capillary: 75 mg/dL (ref 70–99)
Glucose-Capillary: 117 mg/dL — ABNORMAL HIGH (ref 70–99)
Glucose-Capillary: 189 mg/dL — ABNORMAL HIGH (ref 70–99)

## 2020-05-21 LAB — SURGICAL PATHOLOGY

## 2020-05-21 MED ORDER — FUROSEMIDE 20 MG PO TABS
20.0000 mg | ORAL_TABLET | Freq: Once | ORAL | Status: AC
  Administered 2020-05-21: 20 mg via ORAL
  Filled 2020-05-21: qty 1

## 2020-05-21 MED ORDER — MAGNESIUM HYDROXIDE 400 MG/5ML PO SUSP
30.0000 mL | Freq: Two times a day (BID) | ORAL | Status: AC
  Administered 2020-05-21: 30 mL via ORAL
  Filled 2020-05-21 (×2): qty 30

## 2020-05-21 MED ORDER — FUROSEMIDE 40 MG PO TABS
40.0000 mg | ORAL_TABLET | Freq: Once | ORAL | Status: AC
  Administered 2020-05-21: 40 mg via ORAL
  Filled 2020-05-21: qty 1

## 2020-05-21 NOTE — TOC Progression Note (Signed)
Transition of Care [facility]) - Progression Note    Patient Details  Name: April [name] MRN: [id number] Date of Birth: 05-02-1956  Transition of Care [facility]) CM/SW [place], LCSW Phone Number: 05/21/2020, 2:51 PM  Clinical Narrative:    Still waiting on insurance approval for SNF.   Expected Discharge Plan: Skilled Nursing Facility Barriers to Discharge: Ship broker, Continued Medical Work up  Expected Discharge Plan and Services Expected Discharge Plan: [place] In-house Referral: Clinical Social Work   Post Acute Care Choice: [place] Living arrangements for the past 2 months: [place]                                       Social Determinants of Health (SDOH) Interventions    Readmission Risk Interventions Readmission Risk Prevention Plan 05/15/2020 04/19/2020 02/09/2020  Transportation Screening Complete Complete Complete  PCP or Specialist Appt within 3-5 Days - - -  Not Complete comments - - -  HRI or [place] or Home Care Consult comments - - -  Social Work Scientific laboratory technician for [place] Planning/Counseling - - -  SW consult not completed comments - - -  Palliative Care Screening - - -  Medication Review ([place]) Referral to Pharmacy Complete Referral to Pharmacy  PCP or Specialist appointment within 3-5 days of discharge Complete Complete Complete  HRI or Home Care Consult Complete Complete Complete  SW Recovery Care/Counseling Consult Complete Complete -  Palliative Care Screening Not Applicable Not Applicable Complete  Skilled Nursing Facility Complete Complete Patient Refused  Some recent data might be hidden

## 2020-05-21 NOTE — Progress Notes (Signed)
Pt's CBG is 238. Will continue to monitor pt. Ranelle Oyster, RN

## 2020-05-21 NOTE — Addendum Note (Signed)
Addendum  created 05/21/20 1108 by Suzette Battiest, MD   Intraprocedure Staff edited

## 2020-05-21 NOTE — Progress Notes (Signed)
PROGRESS NOTE                                                                                                                                                                                                             Patient Demographics:    April [name], is a 64 y.o. female, DOB - Aug 18, 1956, WUG:[id number]  Admit date - 05/13/2020   Admitting Physician Lequita Halt, MD  Outpatient Primary MD for the patient is [name], MD  LOS - 8  Chief Complaint  Patient presents with  . Shortness of Breath  . Chest Pain       Brief Narrative - April [name] is a 64 y.o. female nursing home resident at [facility] with medical history significant of polycystic kidney disease s/p renal transplant (on prednisone, mycophenalate, prograf), COPD on 2L [place] baseline, diastolic CHF, chronic hypoxic resp failure, obesity, HTN, pulmonary HTN, recurrent UTI, presented to ED withSOB wheezingand fever with no clear source, she has recently been admitted 3-4 times in the last few weeks for similar problems.  She states that about 6 months ago she had a mechanical fall with left-sided hip fracture and since then she has been able to go home and has been in a nursing home.  Her main complaint was gradually progressive shortness of breath, fatigue and low-grade fevers.   Subjective:   Patient in bed, appears comfortable, denies any headache, no fever, no chest pain or pressure, no shortness of breath , no abdominal pain. No focal weakness.   Assessment  & Plan :     1.  Sepsis with leukocytosis, elevated procalcitonin and CRP.  In a patient with kidney transplant, on chronic immunosuppressive therapy with no clear-cut source.  At this time she has had multiple similar admissions in the recent past and no convincing source has been found, blood cultures -ve, she was initially on IV vancomycin and cefepime transition to cefepime only on 05/15/2020, now she has finished her antibiotics.     Recent echo from few weeks ago reviewed without any hint of vegetation, CT chest abdomen pelvis remains unrevealing, unremarkable V/Q and leg venous ultrasound, MRI of the sacrum does not show any abscess or osteomyelitis around the sacral wound,-ve CMV titers.  Sepsis pathophysiology has resolved, she was also seen by ID and she has now finished her antibiotics course and remains afebrile.   Recent Labs  Lab 05/15/20 8882 05/15/20 8003 05/16/20 0350 05/17/20 1021 05/18/20 4917 05/19/20 9150 05/20/20 5697  WBC 8.3   < > 8.4 6.0 5.0 5.7 5.9  PLT 149*   < > 144* 139* 122* 143* 152  CRP 17.7*   < > 8.5* 2.6* 1.4* 0.6 1.3*  DDIMER 1.47*  --  1.00* 1.03* 0.68*  --   --   PROCALCITON 3.96  --  4.17 2.01 1.12 0.47  --    < > = values in this interval not displayed.    2.  History of renal transplant in 2013 due to underlying polycystic kidney disease.  Case discussed with nephrologist Dr. Vanetta Mulders on 05/14/2020, okay for IV contrast with hydration, has been hydrated adequately, discontinue Myfortic as suggested by nephrology, IV steroids have been tapered off and now on 20 mg oral prednisone, gradually taper down to home dose.  Note Lasix to be dosed daily.  She takes 80 mg oral daily.  3.  History of SVT. verapamil dose adjusted further.  4.  History of pulmonary hypertension.  Supportive care.  5.  Chronic grade 2 diastolic dysfunction with EF 55%.  Currently compensated clinically, BNP elevated likely due to pulmonary hypertension. She is on Lasix we are dosing it daily here.  6.  Chronic iron deficiency anemia with mixed picture with low iron reserves but elevated MCV.  Oral iron supplementation, no source of ongoing active bleeding however H&H dropped further on 05/15/2020 due to heme dilution, 1 unit packed RBC on 05/15/2020.  New PPI, seen by GI underwent EGD and colonoscopy which were both nonacute with colonoscopy showing few diverticulosis and anal condyloma.  Follow with GI  outpatient Dr. Benson Norway.  7.  Trace leg edema due to malnutrition.  Protein supplements.  Resumed Lasix.  8.  Borderline elevated D-dimer.  Due to inflammation improved after hydration, on prophylactic dose Lovenox, VQ scan and leg venous ultrasound negative.  9.  Hyponatremia.  Was due to dehydration improved after IV fluids.   10.  AKI.  Likely due to sepsis and hypotension, continue to hydrate and avoid nephrotoxins, monitor closely.   11.  Urinary retention.  Foley placed on 05/14/2020.  Foley removed on 05/17/2020.  Voiding well for now.  12. Small unstageable sacrococcygeal decubitus ulcer which is chronic.  Wound care following.  Appears noninfected.  See wound care note for details.  MRI being done to rule out any deep-seated infection.  Pressure Injury 04/29/20 Coccyx Mid Stage 3 -  Full thickness tissue loss. Subcutaneous fat may be visible but bone, tendon or muscle are NOT exposed. (Active)  04/29/20 2130  Location: Coccyx  Location Orientation: Mid  Staging: Stage 3 -  Full thickness tissue loss. Subcutaneous fat may be visible but bone, tendon or muscle are NOT exposed.  Wound Description (Comments):   Present on Admission: Yes      Condition - Extremely Guarded  Family Communication  :   Left for husband Edison Nasuti [phone] on 05/14/2020 at 13:14 PM  Daughter [name] [phone] on 02/10/2020 at 11:36 AM.   Code Status :  Full  Consults  :  PCCM, Renal Dr. Moshe Cipro over the phone, ID, GI  Procedures  :    EGD and colonoscopy on 05/19/2020 by Dr. Carol Ada.  Mild gastritis, diverticulosis, anal condyloma.  MRI - pelvis- no sacral abscess or osteomyelitis.  VQ - NML  CT chest abdomen pelvis - 1. No acute intra-abdominal or pelvic pathology. 2. Polycystic kidney disease with a right lower quadrant renal transplant. No hydronephrosis or nephrolithiasis. 3. No bowel obstruction. Normal appendix. 4. Small  bilateral pleural effusions with associated bibasilar  atelectasis. Please see report for chest CT. 5. Aortic Atherosclerosis (ICD10-I70.0).  No acute findings in the chest except changes suggestive of pulmonary hypertension.  Mild bilateral pleural effusions with compressive atelectasis.   Bilateral leg venous ultrasound.  No DVT.     TTE. 04/17/20 -  1. Left ventricular ejection fraction, by estimation, is 55 to 60%. The left ventricle has normal function. The left ventricle has no regional wall motion abnormalities. Left ventricular diastolic parameters are consistent with Grade II diastolic dysfunction (pseudonormalization). Elevated left atrial pressure.  2. Right ventricular systolic function is normal. The right ventricular size is normal. There is mildly elevated pulmonary artery systolic pressure. The estimated right ventricular systolic pressure is 71.6 mmHg.  3. Left atrial size was mildly dilated.  4. The mitral valve is degenerative. Trivial mitral valve regurgitation. No evidence of mitral stenosis.  5. The aortic valve is tricuspid. Aortic valve regurgitation is not visualized. No aortic stenosis is present.  6. The inferior vena cava is dilated in size with <50% respiratory variability, suggesting right atrial pressure of 15 mmHg. Comparison(s): No significant change from prior study.   PUD Prophylaxis : PPI  Disposition Plan  :    Status is: Inpatient  Remains inpatient appropriate because:IV treatments appropriate due to intensity of illness or inability to take PO   Dispo: The patient is from: SNF              Anticipated d/c is to: SNF              Anticipated d/c date is: Await bed.              Patient currently is medically stable to d/c.   DVT Prophylaxis  :  Lovenox    Lab Results  Component Value Date   PLT 152 05/20/2020    Diet :  Diet Order            Diet regular Room service appropriate? Yes; Fluid consistency: Thin  Diet effective now                  Inpatient Medications Scheduled Meds: .  allopurinol  300 mg Oral Daily  . ascorbic acid  500 mg Oral Daily  . aspirin  81 mg Oral Daily  . brimonidine  1 drop Both Eyes BID  . Chlorhexidine Gluconate Cloth  6 each Topical Daily  . cholecalciferol  1,000 Units Oral Daily  . collagenase  1 application Topical Daily  . cycloSPORINE  1 drop Both Eyes BID  . docusate sodium  100 mg Oral BID  . dorzolamide  1 drop Both Eyes Daily  . enoxaparin (LOVENOX) injection  40 mg Subcutaneous Q24H  . feeding supplement (ENSURE ENLIVE)  237 mL Oral BID BM  . feeding supplement (PRO-STAT SUGAR FREE 64)  30 mL Oral BID WC  . ferrous sulfate  325 mg Oral BID WC  . FLUoxetine  40 mg Oral Daily  . gabapentin  100 mg Oral TID  . guaiFENesin  600 mg Oral BID  . insulin aspart  0-9 Units Subcutaneous TID WC  . isosorbide mononitrate  60 mg Oral Daily  . latanoprost  1 drop Both Eyes QHS  . magnesium oxide  400 mg Oral BID  . multivitamin with minerals  1 tablet Oral Daily  . pantoprazole  40 mg Oral BID  . predniSONE  20 mg Oral Q breakfast  . senna-docusate  1 tablet  Oral Daily  . tacrolimus  0.5 mg Oral QHS  . tacrolimus  1 mg Oral Daily  . valACYclovir  500 mg Oral BID  . verapamil  180 mg Oral Daily  . vitamin B-12  1,000 mcg Oral Daily   Continuous Infusions:  PRN Meds:.acetaminophen **OR** [DISCONTINUED] acetaminophen, ipratropium-albuterol, levalbuterol, [DISCONTINUED] ondansetron **OR** ondansetron (ZOFRAN) IV  Antibiotics  :   Anti-infectives (From admission, onward)   Start     Dose/Rate Route Frequency Ordered Stop   05/14/20 0200  vancomycin (VANCOREADY) IVPB 750 mg/150 mL  Status:  Discontinued        750 mg 150 mL/hr over 60 Minutes Intravenous Every 12 hours 05/13/20 1239 05/15/20 1522   05/14/20 0000  ceFEPIme (MAXIPIME) 2 g in sodium chloride 0.9 % 100 mL IVPB        2 g 200 mL/hr over 30 Minutes Intravenous Every 12 hours 05/13/20 1239 05/17/20 1300   05/13/20 1500  valACYclovir (VALTREX) tablet 500 mg     Discontinue      500 mg Oral 2 times daily 05/13/20 1420     05/13/20 1115  ceFEPIme (MAXIPIME) 2 g in sodium chloride 0.9 % 100 mL IVPB        2 g 200 mL/hr over 30 Minutes Intravenous  Once 05/13/20 1105 05/13/20 1310   05/13/20 1115  metroNIDAZOLE (FLAGYL) IVPB 500 mg        500 mg 100 mL/hr over 60 Minutes Intravenous  Once 05/13/20 1105 05/13/20 1311   05/13/20 1115  vancomycin (VANCOCIN) IVPB 1000 mg/200 mL premix  Status:  Discontinued        1,000 mg 200 mL/hr over 60 Minutes Intravenous  Once 05/13/20 1105 05/13/20 1107   05/13/20 1115  vancomycin (VANCOREADY) IVPB 1500 mg/300 mL        1,500 mg 150 mL/hr over 120 Minutes Intravenous  Once 05/13/20 1107 05/13/20 1612          Objective:   Vitals:   05/20/20 2040 05/20/20 2349 05/21/20 0338 05/21/20 0823  BP: (!) 132/59 (!) 145/65 138/67 138/73  Pulse: 88 85 79 85  Resp: (!) 25 17 12  (!) 23  Temp: 98.8 F (37.1 C) 98.1 F (36.7 C) 98.1 F (36.7 C) 99.1 F (37.3 C)  TempSrc: Oral Oral Oral Oral  SpO2: 93% 94% 95% 92%  Weight:   72 kg   Height:        SpO2: 92 % O2 Flow Rate (L/min): 2 L/min FiO2 (%): 40 %  Wt Readings from Last 3 Encounters:  05/21/20 72 kg  05/02/20 74.4 kg  04/19/20 73.2 kg     Intake/Output Summary (Last 24 hours) at 05/21/2020 1019 Last data filed at 05/21/2020 0857 Gross per 24 hour  Intake --  Output 2000 ml  Net -2000 ml     Physical Exam  Awake Alert, No new F.N deficits, Normal affect [place].AT,PERRAL Supple Neck,No JVD, No cervical lymphadenopathy appriciated.  Symmetrical Chest wall movement, Good air movement bilaterally, CTAB RRR,No Gallops, Rubs or new Murmurs, No Parasternal Heave +ve B.Sounds, Abd Soft, No tenderness, No organomegaly appriciated, No rebound - guarding or rigidity. No Cyanosis, Clubbing or edema, No new Rash or bruise   Pressure Injury 04/29/20 Coccyx Mid Stage 3 -  Full thickness tissue loss. Subcutaneous fat may be visible but bone, tendon or muscle are NOT  exposed. (Active)  04/29/20 2130  Location: Coccyx  Location Orientation: Mid  Staging: Stage 3 -  Full thickness tissue loss.  Subcutaneous fat may be visible but bone, tendon or muscle are NOT exposed.  Wound Description (Comments):   Present on Admission: Yes     Data Review:    Recent Labs  Lab 05/16/20 0350 05/17/20 1021 05/18/20 0223 05/19/20 0412 05/20/20 0316  WBC 8.4 6.0 5.0 5.7 5.9  HGB 7.9* 8.5* 8.0* 9.0* 9.1*  HCT 26.3* 29.2* 27.5* 29.9* 30.3*  PLT 144* 139* 122* 143* 152  MCV 102.7* 103.5* 102.2* 101.7* 103.1*  MCH 30.9 30.1 29.7 30.6 31.0  MCHC 30.0 29.1* 29.1* 30.1 30.0  RDW 21.8* 20.9* 20.0* 20.0* 20.3*  LYMPHSABS 0.3* 0.7 0.5* 0.9 0.6*  MONOABS 0.1 0.3 0.3 0.3 0.2  EOSABS 0.0 0.0 0.0 0.0 0.0  BASOSABS 0.0 0.0 0.0 0.0 0.0    Recent Labs  Lab 05/15/20 0348 05/15/20 0348 05/16/20 0350 05/17/20 1021 05/18/20 0223 05/19/20 0412 05/20/20 0316  NA 128*   < > 135 140 138 138 137  K 3.4*   < > 5.1 4.4 4.5 4.0 4.5  CL 95*   < > 104 104 102 100 99  CO2 19*   < > 21* 26 28 29 29   GLUCOSE 216*   < > 256* 120* 121* 84 219*  BUN 45*   < > 69* 77* 76* 59* 53*  CREATININE 0.93   < > 1.35* 1.25* 1.13* 0.94 1.02*  CALCIUM 7.7*   < > 9.6 10.1 10.0 10.1 9.7  AST 14*  --  16 17 22   --   --   ALT 14  --  14 14 18   --   --   ALKPHOS 54  --  61 61 58  --   --   BILITOT 3.9*  --  0.8 0.9 0.8  --   --   ALBUMIN 1.8*  --  2.2* 2.4* 2.4*  --   --   MG 2.0   < > 2.5* 2.6* 2.3 2.0 1.9  CRP 17.7*   < > 8.5* 2.6* 1.4* 0.6 1.3*  DDIMER 1.47*  --  1.00* 1.03* 0.68*  --   --   PROCALCITON 3.96  --  4.17 2.01 1.12 0.47  --   BNP 534.1*   < > 608.9* 886.6* 569.2* 269.2* 167.5*   < > = values in this interval not displayed.    Recent Labs  Lab 05/15/20 0348 05/15/20 0348 05/16/20 0350 05/17/20 1021 05/18/20 0223 05/19/20 0412 05/20/20 0316 05/20/20 1319  CRP 17.7*   < > 8.5* 2.6* 1.4* 0.6 1.3*  --   DDIMER 1.47*  --  1.00* 1.03* 0.68*  --   --   --   BNP 534.1*   <  > 608.9* 886.6* 569.2* 269.2* 167.5*  --   PROCALCITON 3.96  --  4.17 2.01 1.12 0.47  --   --   SARSCOV2NAA  --   --   --   --   --   --   --  NEGATIVE   < > = values in this interval not displayed.    ------------------------------------------------------------------------------------------------------------------ No results for input(s): CHOL, HDL, LDLCALC, TRIG, CHOLHDL, LDLDIRECT in the last 72 hours.  Lab Results  Component Value Date   HGBA1C 5.9 (H) 04/29/2020   ------------------------------------------------------------------------------------------------------------------ No results for input(s): TSH, T4TOTAL, T3FREE, THYROIDAB in the last 72 hours.  Invalid input(s): FREET3 ------------------------------------------------------------------------------------------------------------------ No results for input(s): VITAMINB12, FOLATE, FERRITIN, TIBC, IRON, RETICCTPCT in the last 72 hours.  Coagulation profile No results for input(s): INR, PROTIME in the last  168 hours.  No results for input(s): DDIMER in the last 72 hours.  Cardiac Enzymes No results for input(s): CKMB, TROPONINI, MYOGLOBIN in the last 168 hours.  Invalid input(s): CK ------------------------------------------------------------------------------------------------------------------    Component Value Date/Time   BNP 167.5 (H) 05/20/2020 0316    Micro Results Recent Results (from the past 240 hour(s))  Blood Culture (routine x 2)     Status: None   Collection Time: 05/13/20 10:58 AM   Specimen: BLOOD LEFT HAND  Result Value Ref Range Status   Specimen Description BLOOD LEFT HAND  Final   Special Requests   Final    BOTTLES DRAWN AEROBIC AND ANAEROBIC Blood Culture adequate volume   Culture   Final    NO GROWTH 5 DAYS Performed at [facility], [address] [place], [place] [address]    Report Status 05/18/2020 FINAL  Final  Blood Culture (routine x 2)     Status: None   Collection Time:  05/13/20 11:48 AM   Specimen: BLOOD  Result Value Ref Range Status   Specimen Description BLOOD RIGHT ANTECUBITAL  Final   Special Requests   Final    BOTTLES DRAWN AEROBIC AND ANAEROBIC Blood Culture adequate volume   Culture   Final    NO GROWTH 5 DAYS Performed at [facility], [place] [address], [place], [place] [address]    Report Status 05/18/2020 FINAL  Final  SARS Coronavirus 2 by RT PCR (hospital order, performed in [facility] hospital lab) Nasopharyngeal Nasopharyngeal Swab     Status: None   Collection Time: 05/13/20  1:53 PM   Specimen: Nasopharyngeal Swab  Result Value Ref Range Status   SARS Coronavirus 2 NEGATIVE NEGATIVE Final    Comment: (NOTE) SARS-CoV-2 target nucleic acids are NOT DETECTED.  The SARS-CoV-2 RNA is generally detectable in upper and lower respiratory specimens during the acute phase of infection. The lowest concentration of SARS-CoV-2 viral copies this assay can detect is 250 copies / mL. A negative result does not preclude SARS-CoV-2 infection and should not be used as the sole basis for treatment or other patient management decisions.  A negative result may occur with improper specimen collection / handling, submission of specimen other than nasopharyngeal swab, presence of viral mutation(s) within the areas targeted by this assay, and inadequate number of viral copies (<250 copies / mL). A negative result must be combined with clinical observations, patient history, and epidemiological information.  Fact Sheet for Patients:   StrictlyIdeas.no  Fact Sheet for Healthcare Providers: BankingDealers.co.za  This test is not yet approved or  cleared by the [place] FDA and has been authorized for detection and/or diagnosis of SARS-CoV-2 by FDA under an Emergency Use Authorization (EUA).  This EUA will remain in effect (meaning this test can be used) for the duration of the COVID-19  declaration under Section 564(b)(1) of the Act, 21 U.S.C. section 360bbb-3(b)(1), unless the authorization is terminated or revoked sooner.  Performed at [facility], [place] [address], [place], [place] [address]   Urine culture     Status: None   Collection Time: 05/13/20  3:12 PM   Specimen: In/Out Cath Urine  Result Value Ref Range Status   Specimen Description IN/OUT CATH URINE  Final   Special Requests NONE  Final   Culture   Final    NO GROWTH Performed at [facility], [place] [address], [place], [place] [address]    Report Status 05/14/2020 FINAL  Final  MRSA PCR Screening     Status: None   Collection Time: 05/14/20  4:46 AM   Specimen: Nasal Mucosa; Nasopharyngeal  Result Value Ref Range Status   MRSA by PCR NEGATIVE NEGATIVE Final    Comment:        The GeneXpert MRSA Assay (FDA approved for NASAL specimens only), is one component of a comprehensive MRSA colonization surveillance program. It is not intended to diagnose MRSA infection nor to guide or monitor treatment for MRSA infections. Performed at [name], [place] [address], [place], [place] [address]   SARS CORONAVIRUS 2 (TAT 6-24 HRS) Nasopharyngeal Nasopharyngeal Swab     Status: None   Collection Time: 05/20/20  1:19 PM   Specimen: Nasopharyngeal Swab  Result Value Ref Range Status   SARS Coronavirus 2 NEGATIVE NEGATIVE Final    Comment: (NOTE) SARS-CoV-2 target nucleic acids are NOT DETECTED.  The SARS-CoV-2 RNA is generally detectable in upper and lower respiratory specimens during the acute phase of infection. Negative results do not preclude SARS-CoV-2 infection, do not rule out co-infections with other pathogens, and should not be used as the sole basis for treatment or other patient management decisions. Negative results must be combined with clinical observations, patient history, and epidemiological information. The expected result is Negative.  Fact Sheet for  Patients: SugarRoll.be  Fact Sheet for Healthcare Providers: https://www.woods-mathews.com/  This test is not yet approved or cleared by the [place] FDA and  has been authorized for detection and/or diagnosis of SARS-CoV-2 by FDA under an Emergency Use Authorization (EUA). This EUA will remain  in effect (meaning this test can be used) for the duration of the COVID-19 declaration under Se ction 564(b)(1) of the Act, 21 U.S.C. section 360bbb-3(b)(1), unless the authorization is terminated or revoked sooner.  Performed at [facility], [place] [address], [place], [place] [address]     Radiology Reports CT CHEST WO CONTRAST  Result Date: 05/14/2020 CLINICAL DATA:  64 year old female with shortness of breath and chest pain. EXAM: CT CHEST WITHOUT CONTRAST TECHNIQUE: Multidetector CT imaging of the chest was performed following the standard protocol without IV contrast. COMPARISON:  Chest radiograph dated 04/29/2020. FINDINGS: Evaluation of this exam is limited in the absence of intravenous contrast. Cardiovascular: There is mild cardiomegaly. No pericardial effusion. Coronary vascular calcification. There is mild atherosclerotic calcification of the thoracic aorta. There is mild dilatation of the main pulmonary trunk suggestive of pulmonary hypertension. Clinical correlation is recommended. Evaluation of the vasculature is limited in the absence of intravenous contrast. Mediastinum/Nodes: No hilar or mediastinal adenopathy. The esophagus and the thyroid gland are grossly unremarkable. No mediastinal fluid collection. Lungs/Pleura: Small bilateral pleural effusions similar to prior CT with associated partial compressive atelectasis of the lower lobes. Pneumonia is not excluded. Clinical correlation is recommended. There are bibasilar linear atelectasis/scarring. No pneumothorax. The central airways are patent. Upper Abdomen: Please see the report for  the accompanying CT abdomen pelvis. Musculoskeletal: No chest wall mass or suspicious bone lesions identified. IMPRESSION: 1. Small bilateral pleural effusions with associated partial compressive atelectasis of the lower lobes. Pneumonia is not excluded. Clinical correlation is recommended. 2. Mild cardiomegaly with coronary vascular calcification. 3. Mildly dilated main pulmonary trunk suggestive of pulmonary hypertension. Clinical correlation is recommended. 4. Aortic Atherosclerosis (ICD10-I70.0). Electronically Signed   By: Anner Crete M.D.   On: 05/14/2020 21:51   CT Chest Wo Contrast  Result Date: 04/29/2020 CLINICAL DATA:  Pneumonia. Pleural effusion or abscess suspected.  Follow-up to current chest radiograph. EXAM: CT CHEST WITHOUT CONTRAST TECHNIQUE: Multidetector CT imaging of the chest was performed following the standard protocol without IV contrast. COMPARISON:  Current chest radiograph.  CT chest dated 04/15/2020. FINDINGS: Cardiovascular: Heart is mildly enlarged. No pericardial effusion. Minimal left coronary artery calcifications. Aorta is normal in caliber. Minor atherosclerotic calcifications along the descending portion. Main pulmonary artery is dilated to 3.8 cm. Mediastinum/Nodes: No neck base, mediastinal or hilar masses or enlarged lymph nodes. Trachea and esophagus are unremarkable. Lungs/Pleura: Small right and trace left pleural effusions. Mild dependent atelectasis noted in both lower lobes and minimally at the diaphragmatic bases of the left upper lobe lingula right middle lobe and dependent upper lobes. Minor linear atelectasis in the left upper lobe. No convincing pneumonia and no evidence of pulmonary edema. No mass or suspicious nodule. No pneumothorax. Upper Abdomen: Multiple liver and bilateral renal cysts consistent with polycystic kidney disease. Enlarged spleen, 14.5 cm greatest transverse dimension. No acute findings in the visualized upper abdomen and no change from  the prior CT. Musculoskeletal: Multiple old bilateral rib fractures. No acute fractures. No osteoblastic or osteolytic lesions. IMPRESSION: 1. Small right and trace left pleural effusions. There is associated atelectasis, but no convincing pneumonia and no evidence of pulmonary edema. No abscess. The lung appearance is similar to the previous chest CT. 2. Mild cardiomegaly. 3. Enlarged main pulmonary artery suggesting pulmonary artery hypertension. 4. Stable changes polycystic kidney disease including multiple liver cysts. Stable splenomegaly. Aortic Atherosclerosis (ICD10-I70.0). Electronically Signed   By: Lajean Manes M.D.   On: 04/29/2020 16:57   NM Pulmonary Perfusion  Result Date: 05/14/2020 CLINICAL DATA:  Hypoxemia with elevated D-dimer. EXAM: NUCLEAR MEDICINE PERFUSION LUNG SCAN TECHNIQUE: Perfusion images were obtained in multiple projections after intravenous injection of radiopharmaceutical. Ventilation scans intentionally deferred if perfusion scan and chest x-ray adequate for interpretation during COVID 19 epidemic. RADIOPHARMACEUTICALS:  1.5 mCi Tc-76m MAA IV COMPARISON:  None. FINDINGS: There is normal homogeneous distribution of tracer activity seen throughout both lungs. No segmental or subsegmental perfusion defects are identified. IMPRESSION: Normal nuclear medicine perfusion lung scan. Electronically Signed   By: Virgina Norfolk M.D.   On: 05/14/2020 19:46   CT ABDOMEN PELVIS W CONTRAST  Result Date: 05/14/2020 CLINICAL DATA:  64 year old female with abdominal pain. EXAM: CT ABDOMEN AND PELVIS WITH CONTRAST TECHNIQUE: Multidetector CT imaging of the abdomen and pelvis was performed using the standard protocol following bolus administration of intravenous contrast. CONTRAST:  187mL OMNIPAQUE IOHEXOL 300 MG/ML  SOLN COMPARISON:  CT abdomen pelvis dated 04/15/2020. FINDINGS: Lower chest: Partially visualized small bilateral pleural effusions with associated bibasilar atelectasis. Please  see report for chest CT. No intra-abdominal free air or free fluid. Hepatobiliary: Multiple hepatic hypodense lesions measure up to 3 cm. The larger lesions demonstrate fluid attenuation most consistent with cysts and smaller lesions are not well characterized. No calcified gallstone. There is a small pericholecystic fluid. Pancreas: Unremarkable. No pancreatic ductal dilatation or surrounding inflammatory changes. Spleen: Top-normal spleen size measuring up to 14 cm in craniocaudal length. Adrenals/Urinary Tract: The adrenal glands unremarkable. Innumerable bilateral renal cysts essentially replacing the renal parenchyma consistent with polycystic kidney disease. There is no hydronephrosis on either side. There is a right lower quadrant renal transplant. There is no hydronephrosis or nephrolithiasis of the transplant kidney. No peritransplant fluid collection. The urinary bladder is decompressed around a Foley catheter. Stomach/Bowel: There is moderate stool throughout the colon. There is no bowel obstruction or active inflammation. The appendix  is normal. Vascular/Lymphatic: Mild aortoiliac atherosclerotic disease. The IVC is unremarkable. No portal venous gas. There is no adenopathy. Reproductive: The uterus is retroflexed. No adnexal masses. Other: Small fat containing umbilical hernia. Mild diffuse subcutaneous edema. Musculoskeletal: Osteopenia with degenerative changes of the spine. Total left hip arthroplasty. Old left pubic bone fracture as well as old sacral fractures. No acute osseous pathology. IMPRESSION: 1. No acute intra-abdominal or pelvic pathology. 2. Polycystic kidney disease with a right lower quadrant renal transplant. No hydronephrosis or nephrolithiasis. 3. No bowel obstruction. Normal appendix. 4. Small bilateral pleural effusions with associated bibasilar atelectasis. Please see report for chest CT. 5. Aortic Atherosclerosis (ICD10-I70.0). Electronically Signed   By: Anner Crete M.D.    On: 05/14/2020 21:25   MR SACRUM SI JOINTS WO CONTRAST  Result Date: 05/15/2020 CLINICAL DATA:  Chronic sacral wound with recurrent fevers. Evaluate for osteomyelitis. EXAM: MRI SACRUM WITHOUT CONTRAST TECHNIQUE: Multiplanar, multisequence MR imaging of the sacrum was performed. No intravenous contrast was administered. COMPARISON:  CT abdomen pelvis from yesterday. FINDINGS: Bones/Joint/Cartilage No suspicious marrow signal abnormality. No acute fracture or dislocation. Chronic bilateral sacral ala and S2 fractures. Prior left total hip arthroplasty with associated susceptibility artifact. Muscles and Tendons Bilateral piriformis, gluteal, and adductor muscle edema and atrophy. Soft tissue Left-sided sacrococcygeal ulcer. No fluid collection or hematoma. No soft tissue mass. Trace free fluid in the pelvis.  Fibroid uterus. IMPRESSION: 1. Left-sided sacrococcygeal ulcer without evidence of osteomyelitis or abscess. Electronically Signed   By: Titus Dubin M.D.   On: 05/15/2020 16:49   DG Chest Port 1 View  Result Date: 05/16/2020 CLINICAL DATA:  Shortness of breath. EXAM: PORTABLE CHEST 1 VIEW COMPARISON:  One-view chest x-ray 05/14/2020. CT chest without contrast 05/14/2020. FINDINGS: The a heart size is normal. Mild pulmonary vascular congestion is noted. Small right pleural effusion and atelectasis is again noted. No new airspace disease is present. IMPRESSION: 1. Stable appearance of small right pleural effusion and atelectasis. 2. Mild pulmonary vascular congestion. Electronically Signed   By: San Morelle M.D.   On: 05/16/2020 08:06   Portable chest 1 View  Result Date: 05/14/2020 CLINICAL DATA:  Shortness of breath EXAM: PORTABLE CHEST 1 VIEW COMPARISON:  04/23/2020 FINDINGS: Cardiac shadow is mildly prominent but stable accentuated by the portable technique. Overall inspiratory effort is again poor although previously seen atelectasis has improved. No focal confluent infiltrate or  sizable effusion is noted. No bony abnormality is seen. IMPRESSION: No acute abnormality noted. Electronically Signed   By: Inez Catalina M.D.   On: 05/14/2020 09:28   DG Chest Portable 1 View  Result Date: 05/13/2020 CLINICAL DATA:  Short of breath chest pain EXAM: PORTABLE CHEST 1 VIEW COMPARISON:  04/29/2020 FINDINGS: Hypoventilation with decreased lung volume. Mild bibasilar atelectasis similar to the prior study Negative for heart failure or pneumonia. Small right pleural effusion. IMPRESSION: Hypoventilation with bibasilar atelectasis unchanged from the prior study Electronically Signed   By: Franchot Gallo M.D.   On: 05/13/2020 10:54   DG Chest Port 1 View  Result Date: 04/29/2020 CLINICAL DATA:  Shortness of breath EXAM: PORTABLE CHEST 1 VIEW COMPARISON:  Apr 16, 2020 FINDINGS: There is atelectatic change in the lower lung zones bilaterally, stable. No edema or airspace opacity. Heart is mildly enlarged. The pulmonary vascularity is within normal limits. No adenopathy. No bone lesions. IMPRESSION: Mild bibasilar atelectasis. No edema or airspace opacity. Heart mildly enlarged with pulmonary vascularity within normal limits. No evident adenopathy. Electronically Signed  By: Lowella Grip III M.D.   On: 04/29/2020 14:39   VAS [place] LOWER EXTREMITY VENOUS (DVT)  Result Date: 05/13/2020  Lower Venous DVTStudy Indications: SOB, and sepsis.  Comparison Study: 12/16/19, 02/02/20, 03/16/20 all negative for DVT Performing Technologist: June Leap RDMS, RVT  Examination Guidelines: A complete evaluation includes B-mode imaging, spectral Doppler, color Doppler, and power Doppler as needed of all accessible portions of each vessel. Bilateral testing is considered an integral part of a complete examination. Limited examinations for reoccurring indications may be performed as noted. The reflux portion of the exam is performed with the patient in reverse Trendelenburg.   +---------+---------------+---------+-----------+----------+--------------+ RIGHT    CompressibilityPhasicitySpontaneityPropertiesThrombus Aging +---------+---------------+---------+-----------+----------+--------------+ CFV      Full           Yes      Yes                                 +---------+---------------+---------+-----------+----------+--------------+ SFJ      Full                                                        +---------+---------------+---------+-----------+----------+--------------+ FV Prox  Full                                                        +---------+---------------+---------+-----------+----------+--------------+ FV Mid   Full                                                        +---------+---------------+---------+-----------+----------+--------------+ FV DistalFull                                                        +---------+---------------+---------+-----------+----------+--------------+ PFV      Full                                                        +---------+---------------+---------+-----------+----------+--------------+ POP      Full           Yes      Yes                                 +---------+---------------+---------+-----------+----------+--------------+ PTV      Full                                                        +---------+---------------+---------+-----------+----------+--------------+ PERO  Full                                                        +---------+---------------+---------+-----------+----------+--------------+   +---------+---------------+---------+-----------+----------+--------------+ LEFT     CompressibilityPhasicitySpontaneityPropertiesThrombus Aging +---------+---------------+---------+-----------+----------+--------------+ CFV      Full           Yes      Yes                                  +---------+---------------+---------+-----------+----------+--------------+ SFJ      Full                                                        +---------+---------------+---------+-----------+----------+--------------+ FV Prox  Full                                                        +---------+---------------+---------+-----------+----------+--------------+ FV Mid   Full                                                        +---------+---------------+---------+-----------+----------+--------------+ FV DistalFull                                                        +---------+---------------+---------+-----------+----------+--------------+ PFV      Full                                                        +---------+---------------+---------+-----------+----------+--------------+ POP      Full           Yes      Yes                                 +---------+---------------+---------+-----------+----------+--------------+ PTV      Full                                                        +---------+---------------+---------+-----------+----------+--------------+ PERO     Full                                                        +---------+---------------+---------+-----------+----------+--------------+  Summary: BILATERAL: - No evidence of deep vein thrombosis seen in the lower extremities, bilaterally. -No evidence of popliteal cyst, bilaterally.   *See table(s) above for measurements and observations. Electronically signed by Ruta Hinds MD on 05/13/2020 at 3:35:07 PM.    Final     Time Spent in minutes  30   Lala Lund M.D on 05/21/2020 at 10:19 AM  To page go to [email or web address] - password [email or web address]

## 2020-05-21 NOTE — Progress Notes (Signed)
Bipap order is PRN.  Patient is currently on RA with an O2 sat of 94%.  Patient stated she has been off O2 and has been doing fine.  Pulled Bipap out of room.

## 2020-05-22 LAB — MAGNESIUM: Magnesium: 2.2 mg/dL (ref 1.7–2.4)

## 2020-05-22 LAB — GLUCOSE, CAPILLARY
Glucose-Capillary: 238 mg/dL — ABNORMAL HIGH (ref 70–99)
Glucose-Capillary: 72 mg/dL (ref 70–99)
Glucose-Capillary: 155 mg/dL — ABNORMAL HIGH (ref 70–99)

## 2020-05-22 LAB — POTASSIUM: Potassium: 4 mmol/L (ref 3.5–5.1)

## 2020-05-22 MED ORDER — PREDNISONE 10 MG PO TABS: 10.0000 mg | ORAL_TABLET | Freq: Every day | ORAL | 0 refills | Status: AC

## 2020-05-22 MED ORDER — ACETAMINOPHEN 325 MG PO TABS: 650.0000 mg | ORAL_TABLET | Freq: Four times a day (QID) | ORAL | Status: AC | PRN

## 2020-05-22 MED ORDER — VERAPAMIL HCL ER 180 MG PO TBCR: 180.0000 mg | EXTENDED_RELEASE_TABLET | Freq: Every day | ORAL | Status: DC

## 2020-05-22 MED ORDER — PREDNISONE 5 MG PO TABS: 7.0000 mg | ORAL_TABLET | Freq: Every day | ORAL | 0 refills | Status: DC

## 2020-05-22 MED ORDER — ISOSORBIDE MONONITRATE ER 60 MG PO TB24: 60.0000 mg | ORAL_TABLET | Freq: Every day | ORAL | Status: DC

## 2020-05-22 NOTE — Discharge Summary (Signed)
April [name], is a 64 y.o. female  DOB 27-Apr-1956  MRN [id number].  Admission date:  05/13/2020  Admitting Physician  Lequita Halt, MD  Discharge Date:  05/22/2020   Primary MD  [name], MD  Recommendations for primary care physician for things to follow:  -Please check CBC, CMP in 3 days,  -Adjust Lasix dose as needed. -She is on prednisone taper, 10 mg oral daily for 2 days, then back to home dose at 7.5 mg oral daily thereafter.   Admission Diagnosis  PNA (pneumonia) [J18.9] Sepsis, due to unspecified organism, unspecified whether acute organ dysfunction present ([place]) [A41.9]   Discharge Diagnosis  PNA (pneumonia) [J18.9] Sepsis, due to unspecified organism, unspecified whether acute organ dysfunction present [facility]) [A41.9]    Principal Problem:   Fever Active Problems:   COPD with acute exacerbation [facility])      Past Medical History:  Diagnosis Date  . Arthritis   . Asthma   . CHF (congestive heart failure) ([place])   . COPD (chronic obstructive pulmonary disease) ([place])   . Depression   . Essential hypertension 02/08/2020  . GERD (gastroesophageal reflux disease)   . Hyperlipidemia   . Hyperparathyroidism   . Polycystic kidney   . PONV (postoperative nausea and vomiting)     Past Surgical History:  Procedure Laterality Date  . 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 BILATERAL OOPHORECTOMY    . BIOPSY  05/19/2020   Procedure: BIOPSY;  Surgeon: Carol Ada, MD;  Location: [facility] ENDOSCOPY;  Service: Endoscopy;;  . CARPAL TUNNEL RELEASE  2000  . CESAREAN SECTION    . COLONOSCOPY WITH PROPOFOL N/A 05/19/2020   Procedure: COLONOSCOPY WITH PROPOFOL;  Surgeon: Carol Ada, MD;  Location: [place];  Service: Endoscopy;  Laterality: N/A;  . ENTEROSCOPY N/A 05/19/2020   Procedure: ENTEROSCOPY;  Surgeon: Carol Ada, MD;  Location: [place];  Service:  Endoscopy;  Laterality: N/A;  . INSERTION OF DIALYSIS CATHETER  03/31/2012   Procedure: INSERTION OF DIALYSIS CATHETER;  Surgeon: Angelia Mould, MD;  Location: [place];  Service: Vascular;  Laterality: N/A;  insertion of dialysis catheter right internal jugular vein  . 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 ORIF TIBIA FRACTURE Left 12/23/2018   Procedure: OPEN REDUCTION INTERNAL FIXATION (ORIF) TIBIA FRACTURE;  Surgeon: Shona Needles, MD;  Location: [place];  Service: Orthopedics;  Laterality: Left;  . ORIF TIBIA PLATEAU Right 12/23/2018   Procedure: OPEN REDUCTION INTERNAL FIXATION (ORIF) TIBIAL PLATEAU;  Surgeon: Shona Needles, MD;  Location: [place];  Service: Orthopedics;  Laterality: Right;  . ORIF WRIST FRACTURE Left 12/08/2019   Procedure: OPEN REDUCTION INTERNAL FIXATION (ORIF) WRIST FRACTURE, REPAIR LACERATION LEFT FOREARM;  Surgeon: Dorna Leitz, MD;  Location: [facility];  Service: Orthopedics;  Laterality: Left;  . TONSILLECTOMY  1967  . TOTAL HIP ARTHROPLASTY Left 12/08/2019   Procedure: TOTAL HIP ARTHROPLASTY ANTERIOR APPROACH;  Surgeon: Dorna Leitz, MD;  Location: [facility];  Service: Orthopedics;  Laterality: Left;  . TUBAL LIGATION  2010       History of present illness and  Hospital Course:     Kindly see H&P for history of present illness and admission details, please review complete Labs, Consult reports and Test reports for all details in brief  HPI  from the history and physical done on the day of admission    Hospital Course   Sepsis with leukocytosis, elevated procalcitonin and CRP.  In a patient with kidney transplant, on chronic immunosuppressive therapy with no clear-cut source.   -At this time she has had multiple similar admissions in the recent past and no convincing source has been found, blood cultures -ve, she was initially on IV vancomycin and cefepime transition to cefepime only on 05/15/2020, now she has finished her antibiotics.   -Recent echo from few  weeks ago reviewed without any hint of vegetation, CT chest abdomen pelvis remains unrevealing, unremarkable V/Q and leg venous ultrasound, MRI of the sacrum does not show any abscess or osteomyelitis around the sacral wound,-ve CMV titers.  Sepsis pathophysiology has resolved, she was also seen by ID and she has now finished her antibiotics course and remains afebrile.    History of renal transplant in 2013 due to underlying polycystic kidney disease.   - previous MD discussed with nephrologist Dr. Vanetta Mulders on 05/14/2020, okay for IV contrast with hydration, has been hydrated adequately, discontinue Myfortic as suggested by nephrology, IV steroids have been tapered off and now on 20 mg oral prednisone, will be tapered over the next 2 days to go back to home dose of 7.5 mg oral daily, as well her home dose Lasix will be resumed on discharge.  History of SVT. verapamil dose adjusted further.  History of pulmonary hypertension.  Supportive care.  Chronic grade 2 diastolic dysfunction with EF 55%.  Currently compensated clinically, BNP elevated likely due to pulmonary hypertension. She is on Lasix we are dosing it daily here.  Chronic iron deficiency anemia with mixed picture with low iron reserves but elevated MCV.  Oral iron supplementation, no source of ongoing active bleeding however H&H dropped further on 05/15/2020 due to heme dilution, 1 unit packed RBC on 05/15/2020.  New PPI, seen by GI underwent EGD and colonoscopy which were both nonacute with colonoscopy showing few diverticulosis and anal condyloma.  Follow with GI outpatient Dr. Benson Norway.   Trace leg edema due to malnutrition.  Protein supplements.  Resumed Lasix.   Borderline elevated D-dimer.  Due to inflammation improved after hydration,  VQ scan and leg venous ultrasound negative.  Hyponatremia.  Was due to dehydration improved after IV fluids.   AKI.  Likely due to sepsis and hypotension resolved  Urinary  retention.  Foley placed on 05/14/2020.  Foley removed on 05/17/2020.  Voiding well for now.  Small unstageable sacrococcygeal decubitus ulcer which is chronic.  Wound care following.  Appears noninfected.  See wound care note for details.  MRI being done to rule out any deep-seated infection.  There is no evidence of infection or osteomyelitis.   Discharge Condition: stable   Follow UP   Follow-up Information    Lauraine Rinne, NP Follow up on 06/03/2020.   Specialty: Pulmonary Disease Why: Your appointment is at 9:00 AM Contact information: [address] [place] [address] [phone]                 Discharge Instructions  and  Discharge Medications    Discharge  Instructions    Change dressing (specify)   Complete by: As directed    Discharge instructions   Complete by: As directed    Follow with Primary MD [name], MD in 7 days   Get CBC, CMP, 2 view Chest X ray checked  by Primary MD next visit.    Activity: As tolerated with Full fall precautions use walker/cane & assistance as needed   Disposition SNF   Diet: Heart Healthy /Carb modified , with feeding assistance and aspiration precautions.  For Heart failure patients - Check your Weight same time everyday, if you gain over 2 pounds, or you develop in leg swelling, experience more shortness of breath or chest pain, call your Primary MD immediately. Follow Cardiac Low Salt Diet and 1.5 lit/day fluid restriction.   On your next visit with your primary care physician please Get Medicines reviewed and adjusted.   Please request your Prim.MD to go over all Hospital Tests and Procedure/Radiological results at the follow up, please get all Hospital records sent to your Prim MD by signing hospital release before you go home.   If you experience worsening of your admission symptoms, develop shortness of breath, life threatening emergency, suicidal or homicidal thoughts you must seek medical  attention immediately by calling 911 or calling your MD immediately  if symptoms less severe.  You Must read complete instructions/literature along with all the possible adverse reactions/side effects for all the Medicines you take and that have been prescribed to you. Take any new Medicines after you have completely understood and accpet all the possible adverse reactions/side effects.   Do not drive, operating heavy machinery, perform activities at heights, swimming or participation in water activities or provide baby sitting services if your were admitted for syncope or siezures until you have seen by Primary MD or a Neurologist and advised to do so again.  Do not drive when taking Pain medications.    Do not take more than prescribed Pain, Sleep and Anxiety Medications  Special Instructions: If you have smoked or chewed Tobacco  in the last 2 yrs please stop smoking, stop any regular Alcohol  and or any Recreational drug use.  Wear Seat belts while driving.   Please note  You were cared for by a hospitalist during your hospital stay. If you have any questions about your discharge medications or the care you received while you were in the hospital after you are discharged, you can call the unit and asked to speak with the hospitalist on call if the hospitalist that took care of you is not available. Once you are discharged, your primary care physician will handle any further medical issues. Please note that NO REFILLS for any discharge medications will be authorized once you are discharged, as it is imperative that you return to your primary care physician (or establish a relationship with a primary care physician if you do not have one) for your aftercare needs so that they can reassess your need for medications and monitor your lab values.   Increase activity slowly   Complete by: As directed      Allergies as of 05/22/2020      Reactions   Infed [iron Dextran] Other (See Comments)    Chest tightness   Pentamidine Itching, Shortness Of Breath, Swelling   Erythromycin [erythromycin] Other (See Comments)   Mouth Ulcers   Iohexol Other (See Comments)   Per patient "she has had a kidney transplant and should never have contrast"  Oxycodone Nausea Only, Nausea And Vomiting   Erythromycin Rash   Causes breakout in mouth   Ultram [tramadol Hcl] Anxiety      Medication List    STOP taking these medications   hydrOXYzine 10 MG tablet Commonly known as: ATARAX/VISTARIL     TAKE these medications   acetaminophen 325 MG tablet Commonly known as: TYLENOL Take 2 tablets (650 mg total) by mouth every 6 (six) hours as needed for mild pain (or Fever >/= 101).   albuterol 108 (90 Base) MCG/ACT inhaler Commonly known as: VENTOLIN HFA Inhale 2 puffs into the lungs every 4 (four) hours as needed for wheezing or shortness of breath.   allopurinol 300 MG tablet Commonly known as: ZYLOPRIM Take 1 tablet (300 mg total) by mouth daily.   Aplisol 5 UNIT/0.1ML injection Generic drug: tuberculin Inject 5 Units into the skin once.   ascorbic acid 500 MG tablet Commonly known as: VITAMIN C Take 500 mg by mouth daily.   aspirin 81 MG chewable tablet Chew 81 mg by mouth daily.   brimonidine 0.1 % Soln Commonly known as: ALPHAGAN P Place 1 drop into both eyes 2 (two) times daily.   budesonide 0.5 MG/2ML nebulizer solution Commonly known as: PULMICORT Take 2 mLs (0.5 mg total) by nebulization 2 (two) times daily.   cholecalciferol 25 MCG (1000 UNIT) tablet Commonly known as: VITAMIN D3 Take 1 tablet (1,000 Units total) by mouth daily.   collagenase ointment Commonly known as: SANTYL Apply 1 application topically daily.   cycloSPORINE 0.05 % ophthalmic emulsion Commonly known as: RESTASIS Place 1 drop into both eyes 2 (two) times daily.   dextromethorphan-guaiFENesin 30-600 MG 12hr tablet Commonly known as: MUCINEX DM Take 1 tablet by mouth 2 (two) times daily as  needed for cough.   docusate sodium 100 MG capsule Commonly known as: COLACE Take 1 capsule (100 mg total) by mouth 2 (two) times daily.   dorzolamide 2 % ophthalmic solution Commonly known as: TRUSOPT Place 1 drop into both eyes daily.   protein supplement Liqd Take 30 mLs by mouth in the morning and at bedtime.   feeding supplement (ENSURE ENLIVE) Liqd Take 237 mLs by mouth 2 (two) times daily between meals.   feeding supplement (PRO-STAT SUGAR FREE 64) Liqd Take 30 mLs by mouth 2 (two) times daily with a meal.   ferrous sulfate 325 (65 FE) MG tablet Take 325 mg by mouth daily with breakfast.   FLUoxetine 40 MG capsule Commonly known as: PROZAC Take 1 capsule (40 mg total) by mouth daily.   furosemide 40 MG tablet Commonly known as: LASIX Take 1 tablet (40 mg total) by mouth daily.   gabapentin 100 MG capsule Commonly known as: NEURONTIN Take 200 mg by mouth 3 (three) times daily.   guaiFENesin 600 MG 12 hr tablet Commonly known as: MUCINEX Take 1 tablet (600 mg total) by mouth 2 (two) times daily.   ipratropium 17 MCG/ACT inhaler Commonly known as: ATROVENT HFA Inhale 1 puff into the lungs every 6 (six) hours. For COPD   ipratropium-albuterol 0.5-2.5 (3) MG/3ML Soln Commonly known as: DUONEB Take 3 mLs by nebulization 2 (two) times daily.   isosorbide mononitrate 60 MG 24 hr tablet Commonly known as: IMDUR Take 1 tablet (60 mg total) by mouth daily. Start taking on: May 23, 2020   levalbuterol 0.63 MG/3ML nebulizer solution Commonly known as: XOPENEX Take 0.63 mg by nebulization every 6 (six) hours as needed for wheezing or shortness of breath.  magnesium oxide 400 MG tablet Commonly known as: MAG-OX Take 1 tablet (400 mg total) by mouth 2 (two) times daily.   multivitamin with minerals Tabs tablet Take 1 tablet by mouth daily.   mycophenolate 180 MG EC tablet Commonly known as: MYFORTIC Take 180 mg by mouth 2 (two) times daily.   NovoLOG 100  UNIT/ML injection Generic drug: insulin aspart Inject 2-12 Units into the skin See admin instructions. Per sliding scale subcutaneous, once a day - If blood sugar is: 0 - 200: 0 units  201 - 250: 2 units  251 - 300: 4 units  301 - 350: 6 units  351 - 400: 8 units  401 - 450: 10 units  451 - 500: 12 units  500 or greater: Call MD and give 14 units, then recheck blood sugar in 2 hours, at that time if blood sugar is greater than 400 or less than 80, call PEC TRIAGE.   pantoprazole 40 MG tablet Commonly known as: PROTONIX Take 1 tablet (40 mg total) by mouth 2 (two) times daily.   predniSONE 10 MG tablet Commonly known as: DELTASONE Take 1 tablet (10 mg total) by mouth daily with breakfast for 2 days. Start taking on: May 23, 2020 What changed: You were already taking a medication with the same name, and this prescription was added. Make sure you understand how and when to take each.   predniSONE 5 MG tablet Commonly known as: DELTASONE Take 1.5 tablets (7.5 mg total) by mouth daily with breakfast. Start taking on: May 25, 2020 What changed: These instructions start on May 25, 2020. If you are unsure what to do until then, ask your doctor or other care provider.   sennosides-docusate sodium 8.6-50 MG tablet Commonly known as: SENOKOT-S Take 1 tablet by mouth daily.   Symbicort 160-4.5 MCG/ACT inhaler Generic drug: budesonide-formoterol Inhale 2 puffs into the lungs 2 (two) times daily.   tacrolimus 0.5 MG capsule Commonly known as: PROGRAF Take 0.5 mg by mouth at bedtime. Takes brand name Prograf   Prograf 1 MG capsule Generic drug: tacrolimus Take 1 mg by mouth daily.   Travatan Z 0.004 % Soln ophthalmic solution Generic drug: Travoprost (BAK Free) Place 1 drop into both eyes at bedtime.   valACYclovir 500 MG tablet Commonly known as: VALTREX Take 500 mg by mouth 2 (two) times daily.   verapamil 180 MG CR tablet Commonly known as: CALAN-SR Take 1 tablet (180 mg  total) by mouth daily. Start taking on: May 23, 2020 What changed:   medication strength  how much to take   vitamin B-12 100 MCG tablet Commonly known as: CYANOCOBALAMIN Take 1,000 mcg by mouth daily.            Discharge Care Instructions  (From admission, onward)         Start     Ordered   05/22/20 0000  Change dressing (specify)        05/22/20 1045            Diet and Activity recommendation: See Discharge Instructions above   Consults obtained -   PCCM, Renal Dr. Moshe Cipro over the phone, ID, GI  Major procedures and Radiology Reports - PLEASE review detailed and final reports for all details, in brief -     CT CHEST WO CONTRAST  Result Date: 05/14/2020 CLINICAL DATA:  64 year old female with shortness of breath and chest pain. EXAM: CT CHEST WITHOUT CONTRAST TECHNIQUE: Multidetector CT imaging of the chest was  performed following the standard protocol without IV contrast. COMPARISON:  Chest radiograph dated 04/29/2020. FINDINGS: Evaluation of this exam is limited in the absence of intravenous contrast. Cardiovascular: There is mild cardiomegaly. No pericardial effusion. Coronary vascular calcification. There is mild atherosclerotic calcification of the thoracic aorta. There is mild dilatation of the main pulmonary trunk suggestive of pulmonary hypertension. Clinical correlation is recommended. Evaluation of the vasculature is limited in the absence of intravenous contrast. Mediastinum/Nodes: No hilar or mediastinal adenopathy. The esophagus and the thyroid gland are grossly unremarkable. No mediastinal fluid collection. Lungs/Pleura: Small bilateral pleural effusions similar to prior CT with associated partial compressive atelectasis of the lower lobes. Pneumonia is not excluded. Clinical correlation is recommended. There are bibasilar linear atelectasis/scarring. No pneumothorax. The central airways are patent. Upper Abdomen: Please see the report for the  accompanying CT abdomen pelvis. Musculoskeletal: No chest wall mass or suspicious bone lesions identified. IMPRESSION: 1. Small bilateral pleural effusions with associated partial compressive atelectasis of the lower lobes. Pneumonia is not excluded. Clinical correlation is recommended. 2. Mild cardiomegaly with coronary vascular calcification. 3. Mildly dilated main pulmonary trunk suggestive of pulmonary hypertension. Clinical correlation is recommended. 4. Aortic Atherosclerosis (ICD10-I70.0). Electronically Signed   By: Anner Crete M.D.   On: 05/14/2020 21:51   CT Chest Wo Contrast  Result Date: 04/29/2020 CLINICAL DATA:  Pneumonia. Pleural effusion or abscess suspected. Follow-up to current chest radiograph. EXAM: CT CHEST WITHOUT CONTRAST TECHNIQUE: Multidetector CT imaging of the chest was performed following the standard protocol without IV contrast. COMPARISON:  Current chest radiograph.  CT chest dated 04/15/2020. FINDINGS: Cardiovascular: Heart is mildly enlarged. No pericardial effusion. Minimal left coronary artery calcifications. Aorta is normal in caliber. Minor atherosclerotic calcifications along the descending portion. Main pulmonary artery is dilated to 3.8 cm. Mediastinum/Nodes: No neck base, mediastinal or hilar masses or enlarged lymph nodes. Trachea and esophagus are unremarkable. Lungs/Pleura: Small right and trace left pleural effusions. Mild dependent atelectasis noted in both lower lobes and minimally at the diaphragmatic bases of the left upper lobe lingula right middle lobe and dependent upper lobes. Minor linear atelectasis in the left upper lobe. No convincing pneumonia and no evidence of pulmonary edema. No mass or suspicious nodule. No pneumothorax. Upper Abdomen: Multiple liver and bilateral renal cysts consistent with polycystic kidney disease. Enlarged spleen, 14.5 cm greatest transverse dimension. No acute findings in the visualized upper abdomen and no change from the  prior CT. Musculoskeletal: Multiple old bilateral rib fractures. No acute fractures. No osteoblastic or osteolytic lesions. IMPRESSION: 1. Small right and trace left pleural effusions. There is associated atelectasis, but no convincing pneumonia and no evidence of pulmonary edema. No abscess. The lung appearance is similar to the previous chest CT. 2. Mild cardiomegaly. 3. Enlarged main pulmonary artery suggesting pulmonary artery hypertension. 4. Stable changes polycystic kidney disease including multiple liver cysts. Stable splenomegaly. Aortic Atherosclerosis (ICD10-I70.0). Electronically Signed   By: Lajean Manes M.D.   On: 04/29/2020 16:57   NM Pulmonary Perfusion  Result Date: 05/14/2020 CLINICAL DATA:  Hypoxemia with elevated D-dimer. EXAM: NUCLEAR MEDICINE PERFUSION LUNG SCAN TECHNIQUE: Perfusion images were obtained in multiple projections after intravenous injection of radiopharmaceutical. Ventilation scans intentionally deferred if perfusion scan and chest x-ray adequate for interpretation during COVID 19 epidemic. RADIOPHARMACEUTICALS:  1.5 mCi Tc-35m MAA IV COMPARISON:  None. FINDINGS: There is normal homogeneous distribution of tracer activity seen throughout both lungs. No segmental or subsegmental perfusion defects are identified. IMPRESSION: Normal nuclear medicine perfusion lung scan. Electronically  Signed   By: Virgina Norfolk M.D.   On: 05/14/2020 19:46   CT ABDOMEN PELVIS W CONTRAST  Result Date: 05/14/2020 CLINICAL DATA:  64 year old female with abdominal pain. EXAM: CT ABDOMEN AND PELVIS WITH CONTRAST TECHNIQUE: Multidetector CT imaging of the abdomen and pelvis was performed using the standard protocol following bolus administration of intravenous contrast. CONTRAST:  130mL OMNIPAQUE IOHEXOL 300 MG/ML  SOLN COMPARISON:  CT abdomen pelvis dated 04/15/2020. FINDINGS: Lower chest: Partially visualized small bilateral pleural effusions with associated bibasilar atelectasis. Please see  report for chest CT. No intra-abdominal free air or free fluid. Hepatobiliary: Multiple hepatic hypodense lesions measure up to 3 cm. The larger lesions demonstrate fluid attenuation most consistent with cysts and smaller lesions are not well characterized. No calcified gallstone. There is a small pericholecystic fluid. Pancreas: Unremarkable. No pancreatic ductal dilatation or surrounding inflammatory changes. Spleen: Top-normal spleen size measuring up to 14 cm in craniocaudal length. Adrenals/Urinary Tract: The adrenal glands unremarkable. Innumerable bilateral renal cysts essentially replacing the renal parenchyma consistent with polycystic kidney disease. There is no hydronephrosis on either side. There is a right lower quadrant renal transplant. There is no hydronephrosis or nephrolithiasis of the transplant kidney. No peritransplant fluid collection. The urinary bladder is decompressed around a Foley catheter. Stomach/Bowel: There is moderate stool throughout the colon. There is no bowel obstruction or active inflammation. The appendix is normal. Vascular/Lymphatic: Mild aortoiliac atherosclerotic disease. The IVC is unremarkable. No portal venous gas. There is no adenopathy. Reproductive: The uterus is retroflexed. No adnexal masses. Other: Small fat containing umbilical hernia. Mild diffuse subcutaneous edema. Musculoskeletal: Osteopenia with degenerative changes of the spine. Total left hip arthroplasty. Old left pubic bone fracture as well as old sacral fractures. No acute osseous pathology. IMPRESSION: 1. No acute intra-abdominal or pelvic pathology. 2. Polycystic kidney disease with a right lower quadrant renal transplant. No hydronephrosis or nephrolithiasis. 3. No bowel obstruction. Normal appendix. 4. Small bilateral pleural effusions with associated bibasilar atelectasis. Please see report for chest CT. 5. Aortic Atherosclerosis (ICD10-I70.0). Electronically Signed   By: Anner Crete M.D.   On:  05/14/2020 21:25   MR SACRUM SI JOINTS WO CONTRAST  Result Date: 05/15/2020 CLINICAL DATA:  Chronic sacral wound with recurrent fevers. Evaluate for osteomyelitis. EXAM: MRI SACRUM WITHOUT CONTRAST TECHNIQUE: Multiplanar, multisequence MR imaging of the sacrum was performed. No intravenous contrast was administered. COMPARISON:  CT abdomen pelvis from yesterday. FINDINGS: Bones/Joint/Cartilage No suspicious marrow signal abnormality. No acute fracture or dislocation. Chronic bilateral sacral ala and S2 fractures. Prior left total hip arthroplasty with associated susceptibility artifact. Muscles and Tendons Bilateral piriformis, gluteal, and adductor muscle edema and atrophy. Soft tissue Left-sided sacrococcygeal ulcer. No fluid collection or hematoma. No soft tissue mass. Trace free fluid in the pelvis.  Fibroid uterus. IMPRESSION: 1. Left-sided sacrococcygeal ulcer without evidence of osteomyelitis or abscess. Electronically Signed   By: Titus Dubin M.D.   On: 05/15/2020 16:49   DG Chest Port 1 View  Result Date: 05/16/2020 CLINICAL DATA:  Shortness of breath. EXAM: PORTABLE CHEST 1 VIEW COMPARISON:  One-view chest x-ray 05/14/2020. CT chest without contrast 05/14/2020. FINDINGS: The a heart size is normal. Mild pulmonary vascular congestion is noted. Small right pleural effusion and atelectasis is again noted. No new airspace disease is present. IMPRESSION: 1. Stable appearance of small right pleural effusion and atelectasis. 2. Mild pulmonary vascular congestion. Electronically Signed   By: San Morelle M.D.   On: 05/16/2020 08:06   Portable chest 1 View  Result Date: 05/14/2020 CLINICAL DATA:  Shortness of breath EXAM: PORTABLE CHEST 1 VIEW COMPARISON:  04/23/2020 FINDINGS: Cardiac shadow is mildly prominent but stable accentuated by the portable technique. Overall inspiratory effort is again poor although previously seen atelectasis has improved. No focal confluent infiltrate or sizable  effusion is noted. No bony abnormality is seen. IMPRESSION: No acute abnormality noted. Electronically Signed   By: Inez Catalina M.D.   On: 05/14/2020 09:28   DG Chest Portable 1 View  Result Date: 05/13/2020 CLINICAL DATA:  Short of breath chest pain EXAM: PORTABLE CHEST 1 VIEW COMPARISON:  04/29/2020 FINDINGS: Hypoventilation with decreased lung volume. Mild bibasilar atelectasis similar to the prior study Negative for heart failure or pneumonia. Small right pleural effusion. IMPRESSION: Hypoventilation with bibasilar atelectasis unchanged from the prior study Electronically Signed   By: Franchot Gallo M.D.   On: 05/13/2020 10:54   DG Chest Port 1 View  Result Date: 04/29/2020 CLINICAL DATA:  Shortness of breath EXAM: PORTABLE CHEST 1 VIEW COMPARISON:  Apr 16, 2020 FINDINGS: There is atelectatic change in the lower lung zones bilaterally, stable. No edema or airspace opacity. Heart is mildly enlarged. The pulmonary vascularity is within normal limits. No adenopathy. No bone lesions. IMPRESSION: Mild bibasilar atelectasis. No edema or airspace opacity. Heart mildly enlarged with pulmonary vascularity within normal limits. No evident adenopathy. Electronically Signed   By: Lowella Grip III M.D.   On: 04/29/2020 14:39   VAS [place] LOWER EXTREMITY VENOUS (DVT)  Result Date: 05/13/2020  Lower Venous DVTStudy Indications: SOB, and sepsis.  Comparison Study: 12/16/19, 02/02/20, 03/16/20 all negative for DVT Performing Technologist: June Leap RDMS, RVT  Examination Guidelines: A complete evaluation includes B-mode imaging, spectral Doppler, color Doppler, and power Doppler as needed of all accessible portions of each vessel. Bilateral testing is considered an integral part of a complete examination. Limited examinations for reoccurring indications may be performed as noted. The reflux portion of the exam is performed with the patient in reverse Trendelenburg.   +---------+---------------+---------+-----------+----------+--------------+ RIGHT    CompressibilityPhasicitySpontaneityPropertiesThrombus Aging +---------+---------------+---------+-----------+----------+--------------+ CFV      Full           Yes      Yes                                 +---------+---------------+---------+-----------+----------+--------------+ SFJ      Full                                                        +---------+---------------+---------+-----------+----------+--------------+ FV Prox  Full                                                        +---------+---------------+---------+-----------+----------+--------------+ FV Mid   Full                                                        +---------+---------------+---------+-----------+----------+--------------+ FV DistalFull                                                        +---------+---------------+---------+-----------+----------+--------------+  PFV      Full                                                        +---------+---------------+---------+-----------+----------+--------------+ POP      Full           Yes      Yes                                 +---------+---------------+---------+-----------+----------+--------------+ PTV      Full                                                        +---------+---------------+---------+-----------+----------+--------------+ PERO     Full                                                        +---------+---------------+---------+-----------+----------+--------------+   +---------+---------------+---------+-----------+----------+--------------+ LEFT     CompressibilityPhasicitySpontaneityPropertiesThrombus Aging +---------+---------------+---------+-----------+----------+--------------+ CFV      Full           Yes      Yes                                  +---------+---------------+---------+-----------+----------+--------------+ SFJ      Full                                                        +---------+---------------+---------+-----------+----------+--------------+ FV Prox  Full                                                        +---------+---------------+---------+-----------+----------+--------------+ FV Mid   Full                                                        +---------+---------------+---------+-----------+----------+--------------+ FV DistalFull                                                        +---------+---------------+---------+-----------+----------+--------------+ PFV      Full                                                        +---------+---------------+---------+-----------+----------+--------------+   POP      Full           Yes      Yes                                 +---------+---------------+---------+-----------+----------+--------------+ PTV      Full                                                        +---------+---------------+---------+-----------+----------+--------------+ PERO     Full                                                        +---------+---------------+---------+-----------+----------+--------------+     Summary: BILATERAL: - No evidence of deep vein thrombosis seen in the lower extremities, bilaterally. -No evidence of popliteal cyst, bilaterally.   *See table(s) above for measurements and observations. Electronically signed by Ruta Hinds MD on 05/13/2020 at 3:35:07 PM.    Final     Micro Results    Recent Results (from the past 240 hour(s))  Blood Culture (routine x 2)     Status: None   Collection Time: 05/13/20 10:58 AM   Specimen: BLOOD LEFT HAND  Result Value Ref Range Status   Specimen Description BLOOD LEFT HAND  Final   Special Requests   Final    BOTTLES DRAWN AEROBIC AND ANAEROBIC Blood Culture adequate volume    Culture   Final    NO GROWTH 5 DAYS Performed at [facility], [address] [place], [place] [address]    Report Status 05/18/2020 FINAL  Final  Blood Culture (routine x 2)     Status: None   Collection Time: 05/13/20 11:48 AM   Specimen: BLOOD  Result Value Ref Range Status   Specimen Description BLOOD RIGHT ANTECUBITAL  Final   Special Requests   Final    BOTTLES DRAWN AEROBIC AND ANAEROBIC Blood Culture adequate volume   Culture   Final    NO GROWTH 5 DAYS Performed at [facility], [place] [address], [place], [place] [address]    Report Status 05/18/2020 FINAL  Final  SARS Coronavirus 2 by RT PCR (hospital order, performed in [facility] hospital lab) Nasopharyngeal Nasopharyngeal Swab     Status: None   Collection Time: 05/13/20  1:53 PM   Specimen: Nasopharyngeal Swab  Result Value Ref Range Status   SARS Coronavirus 2 NEGATIVE NEGATIVE Final    Comment: (NOTE) SARS-CoV-2 target nucleic acids are NOT DETECTED.  The SARS-CoV-2 RNA is generally detectable in upper and lower respiratory specimens during the acute phase of infection. The lowest concentration of SARS-CoV-2 viral copies this assay can detect is 250 copies / mL. A negative result does not preclude SARS-CoV-2 infection and should not be used as the sole basis for treatment or other patient management decisions.  A negative result may occur with improper specimen collection / handling, submission of specimen other than nasopharyngeal swab, presence of viral mutation(s) within the areas targeted by this assay, and inadequate number of viral copies (<250 copies / mL). A negative result must  be combined with clinical observations, patient history, and epidemiological information.  Fact Sheet for Patients:   StrictlyIdeas.no  Fact Sheet for Healthcare Providers: BankingDealers.co.za  This test is not yet approved or  cleared by the [place] FDA  and has been authorized for detection and/or diagnosis of SARS-CoV-2 by FDA under an Emergency Use Authorization (EUA).  This EUA will remain in effect (meaning this test can be used) for the duration of the COVID-19 declaration under Section 564(b)(1) of the Act, 21 U.S.C. section 360bbb-3(b)(1), unless the authorization is terminated or revoked sooner.  Performed at [facility], [place] [address] [place], [place] [address]   Urine culture     Status: None   Collection Time: 05/13/20  3:12 PM   Specimen: In/Out Cath Urine  Result Value Ref Range Status   Specimen Description IN/OUT CATH URINE  Final   Special Requests NONE  Final   Culture   Final    NO GROWTH Performed at [facility], [place] [address] [place], [place] [address]    Report Status 05/14/2020 FINAL  Final  MRSA PCR Screening     Status: None   Collection Time: 05/14/20  4:46 AM   Specimen: Nasal Mucosa; Nasopharyngeal  Result Value Ref Range Status   MRSA by PCR NEGATIVE NEGATIVE Final    Comment:        The GeneXpert MRSA Assay (FDA approved for NASAL specimens only), is one component of a comprehensive MRSA colonization surveillance program. It is not intended to diagnose MRSA infection nor to guide or monitor treatment for MRSA infections. Performed at [facility], [place] [address] [place], [place] [address]   SARS CORONAVIRUS 2 (TAT 6-24 HRS) Nasopharyngeal Nasopharyngeal Swab     Status: None   Collection Time: 05/20/20  1:19 PM   Specimen: Nasopharyngeal Swab  Result Value Ref Range Status   SARS Coronavirus 2 NEGATIVE NEGATIVE Final    Comment: (NOTE) SARS-CoV-2 target nucleic acids are NOT DETECTED.  The SARS-CoV-2 RNA is generally detectable in upper and lower respiratory specimens during the acute phase of infection. Negative results do not preclude SARS-CoV-2 infection, do not rule out co-infections with other pathogens, and should not be used as the sole basis for  treatment or other patient management decisions. Negative results must be combined with clinical observations, patient history, and epidemiological information. The expected result is Negative.  Fact Sheet for Patients: SugarRoll.be  Fact Sheet for Healthcare Providers: https://www.woods-mathews.com/  This test is not yet approved or cleared by the [place] FDA and  has been authorized for detection and/or diagnosis of SARS-CoV-2 by FDA under an Emergency Use Authorization (EUA). This EUA will remain  in effect (meaning this test can be used) for the duration of the COVID-19 declaration under Se ction 564(b)(1) of the Act, 21 U.S.C. section 360bbb-3(b)(1), unless the authorization is terminated or revoked sooner.  Performed at [facility], [place] [address], [place], [place] [address]        Today   Subjective:   Natsumi Whitsitt today has no headache,no chest abdominal pain,no new weakness tingling or numbness, feels much better wants to go home today.   Objective:   Blood pressure 140/71, pulse 83, temperature 97.6 F (36.4 C), temperature source Oral, resp. rate (!) 21, height 5\' 1"  (1.549 m), weight 72 kg, SpO2 92 %.   Intake/Output Summary (Last 24 hours) at 05/22/2020 1046 Last data filed at 05/22/2020 0609 Gross per 24  hour  Intake 340 ml  Output 1000 ml  Net -660 ml    Exam Awake Alert, Oriented x 3, No new F.N deficits, Normal affect Symmetrical Chest wall movement, Good air movement bilaterally, CTAB RRR,No Gallops,Rubs or new Murmurs, No Parasternal Heave +ve B.Sounds, Abd Soft, Non tender,No rebound -guarding or rigidity. No Cyanosis, Clubbing or edema, No new Rash or bruise  Data Review   CBC w Diff:  Lab Results  Component Value Date   WBC 5.9 05/20/2020   HGB 9.1 (L) 05/20/2020   HGB 10.6 (L) 12/29/2010   HCT 30.3 (L) 05/20/2020   HCT 31.2 (L) 12/29/2010   PLT 152 05/20/2020   PLT 205  12/29/2010   LYMPHOPCT 11 05/20/2020   LYMPHOPCT 19.8 12/29/2010   MONOPCT 4 05/20/2020   MONOPCT 6.2 12/29/2010   EOSPCT 1 05/20/2020   EOSPCT 2.8 12/29/2010   BASOPCT 0 05/20/2020   BASOPCT 0.4 12/29/2010    CMP:  Lab Results  Component Value Date   NA 137 05/20/2020   K 4.0 05/22/2020   CL 99 05/20/2020   CO2 29 05/20/2020   BUN 53 (H) 05/20/2020   CREATININE 1.02 (H) 05/20/2020   PROT 6.2 (L) 05/18/2020   ALBUMIN 2.4 (L) 05/18/2020   BILITOT 0.8 05/18/2020   ALKPHOS 58 05/18/2020   AST 22 05/18/2020   ALT 18 05/18/2020  .   Total Time in preparing paper work, data evaluation and todays exam - 35 minutes  Phillips Climes M.D on 05/22/2020 at 10:46 AM  [facility]   Office  [phone]

## 2020-05-22 NOTE — Discharge Instructions (Signed)
Follow with Primary MD [name], MD in 7 days   Get CBC, CMP, 2 view Chest X ray checked  by Primary MD next visit.    Activity: As tolerated with Full fall precautions use walker/cane & assistance as needed   Disposition Home    Diet: Heart Healthy /Carb modified, with feeding assistance and aspiration precautions.  For Heart failure patients - Check your Weight same time everyday, if you gain over 2 pounds, or you develop in leg swelling, experience more shortness of breath or chest pain, call your Primary MD immediately. Follow Cardiac Low Salt Diet and 1.5 lit/day fluid restriction.   On your next visit with your primary care physician please Get Medicines reviewed and adjusted.   Please request your Prim.MD to go over all Hospital Tests and Procedure/Radiological results at the follow up, please get all Hospital records sent to your Prim MD by signing hospital release before you go home.   If you experience worsening of your admission symptoms, develop shortness of breath, life threatening emergency, suicidal or homicidal thoughts you must seek medical attention immediately by calling 911 or calling your MD immediately  if symptoms less severe.  You Must read complete instructions/literature along with all the possible adverse reactions/side effects for all the Medicines you take and that have been prescribed to you. Take any new Medicines after you have completely understood and accpet all the possible adverse reactions/side effects.   Do not drive, operating heavy machinery, perform activities at heights, swimming or participation in water activities or provide baby sitting services if your were admitted for syncope or siezures until you have seen by Primary MD or a Neurologist and advised to do so again.  Do not drive when taking Pain medications.    Do not take more than prescribed Pain, Sleep and Anxiety Medications  Special Instructions: If you have smoked or chewed  Tobacco  in the last 2 yrs please stop smoking, stop any regular Alcohol  and or any Recreational drug use.  Wear Seat belts while driving.   Please note  You were cared for by a hospitalist during your hospital stay. If you have any questions about your discharge medications or the care you received while you were in the hospital after you are discharged, you can call the unit and asked to speak with the hospitalist on call if the hospitalist that took care of you is not available. Once you are discharged, your primary care physician will handle any further medical issues. Please note that NO REFILLS for any discharge medications will be authorized once you are discharged, as it is imperative that you return to your primary care physician (or establish a relationship with a primary care physician if you do not have one) for your aftercare needs so that they can reassess your need for medications and monitor your lab values.

## 2020-05-22 NOTE — TOC Progression Note (Signed)
Transition of Care [facility]) - Progression Note    Patient Details  Name: April [name] MRN: [id number] Date of Birth: Dec 02, 1955  [facility] of [facility]) CM/SW Kim, LCSW Phone Number: 05/22/2020, 9:26 AM  Clinical Narrative:    Insurance approval received for [facility]: [id number], Ref# [id number] effective through 05/23/20.   Expected Discharge Plan: Skilled Nursing Facility Barriers to Discharge: Ship broker, Continued Medical Work up  Expected Discharge Plan and Services Expected Discharge Plan: [place] In-house Referral: Clinical Social Work   Post Acute Care Choice: [place] Living arrangements for the past 2 months: [place]                                       Social Determinants of Health (SDOH) Interventions    Readmission Risk Interventions Readmission Risk Prevention Plan 05/15/2020 04/19/2020 02/09/2020  Transportation Screening Complete Complete Complete  PCP or Specialist Appt within 3-5 Days - - -  Not Complete comments - - -  HRI or [place] or Home Care Consult comments - - -  Social Work Scientific laboratory technician for [place] Planning/Counseling - - -  SW consult not completed comments - - -  Palliative Care Screening - - -  Medication Review ([place]) Referral to Pharmacy Complete Referral to Pharmacy  PCP or Specialist appointment within 3-5 days of discharge Complete Complete Complete  HRI or Home Care Consult Complete Complete Complete  SW Recovery Care/Counseling Consult Complete Complete -  Palliative Care Screening Not Applicable Not Applicable Complete  Skilled Nursing Facility Complete Complete Patient Refused  Some recent data might be hidden

## 2020-05-22 NOTE — Care Management Important Message (Signed)
Important Message  Patient Details  Name: April [name] MRN: [id number] Date of Birth: Mar 21, 1956   Medicare Important Message Given:  Yes - Important Message mailed due to current National Emergency  Verbal consent obtained due to current National Emergency  Relationship to patient: Self Contact Name: Stanley Helmuth Call Date: 05/22/20  Time: 1248 Phone: [phone] Outcome: Spoke with contact Important Message mailed to: Other (must enter comment) (per patient additional copy of IM not needed)    Saysha Menta P Donyale Falcon 05/22/2020, 12:48 PM

## 2020-05-22 NOTE — Progress Notes (Signed)
April [name] to be D/C'd Skilled nursing facility per MD order.  Discussed with the patient and all questions fully answered.  VSS, Skin clean, dry and there is scattered ecchymosis on her arms bilaterally and a skin tear on the right arm. IV catheter discontinued intact. Site without signs and symptoms of complications. Dressing and pressure applied.  An After Visit Summary was printed and given to the patient.   D/c education completed with patient/family including follow up instructions, medication list, d/c activities limitations if indicated, with other d/c instructions as indicated by MD - patient able to verbalize understanding, all questions fully answered.   Patient instructed to return to ED, call 911, or call MD for any changes in condition.   Patient escorted via stretcher, and D/C home via [facility].  Dene Gentry 05/22/2020 11:47 AM

## 2020-05-22 NOTE — TOC Transition Note (Signed)
Transition of Care [facility]) - CM/SW Discharge Note   Patient Details  Name: April [name] MRN: [id number] Date of Birth: [birth year]  [facility]) CM/SW Contact:  Benard Halsted, LCSW Phone Number: 05/22/2020, 11:03 AM   Clinical Narrative:    Patient will DC to: [facility] Anticipated DC date: 05/22/20 Family notified: Daughter, [name] Transport by: [name]   Per MD patient ready for DC to [place]. RN, patient, patient's family, and facility notified of DC. Discharge Summary and FL2 sent to facility. RN to call report prior to discharge [phone] Room 1203P). DC packet on chart. Ambulance transport requested for patient.   CSW will sign off for now as social work intervention is no longer needed. Please consult [place] again if new needs arise.      Final next level of care: [place] Barriers to Discharge: Barriers Resolved   Patient Goals and CMS Choice Patient states their goals for this hospitalization and ongoing recovery are:: Return to [place] to continue rehab CMS Medicare.gov Compare Post Acute Care list provided to::  (NA) Choice offered to / list presented to : NA  Discharge Placement   Existing PASRR number confirmed : 05/22/20          Patient [facility] at: [facility] Patient to be transferred to facility by: [facility]   Patient and family notified of of transfer: 05/22/20  Discharge Plan and Services In-house Referral: Clinical Social Work   Post Acute Care Choice: [place]                               Social Determinants of Health (SDOH) Interventions     Readmission Risk Interventions Readmission Risk Prevention Plan 05/15/2020 04/19/2020 02/09/2020  Transportation Screening Complete Complete Complete  PCP or Specialist Appt within 3-5 Days - - -  Not Complete comments - - -  HRI or [place] or Home Care Consult comments - - -  Social Work Scientific laboratory technician for [place]  Planning/Counseling - - -  SW consult not completed comments - - -  Palliative Care Screening - - -  Medication Review ([place]) Referral to Pharmacy Complete Referral to Pharmacy  PCP or Specialist appointment within 3-5 days of discharge Complete Complete Complete  HRI or Home Care Consult Complete Complete Complete  SW Recovery Care/Counseling Consult Complete Complete -  Palliative Care Screening Not Applicable Not Applicable Complete  Skilled Nursing Facility Complete Complete Patient Refused  Some recent data might be hidden

## 2020-05-27 ENCOUNTER — Other Ambulatory Visit: Payer: Self-pay

## 2020-05-27 ENCOUNTER — Inpatient Hospital Stay (HOSPITAL_COMMUNITY)
Admission: EM | Admit: 2020-05-27 | Discharge: 2020-06-06 | DRG: 864 | Disposition: A | Discharge status: Skilled Nursing Facility | Payer: Medicare Other | Source: Skilled Nursing Facility | Attending: Internal Medicine | Admitting: Internal Medicine

## 2020-05-27 ENCOUNTER — Emergency Department (HOSPITAL_COMMUNITY): Payer: Medicare Other

## 2020-05-27 DIAGNOSIS — I5032 Chronic diastolic (congestive) heart failure: Secondary | ICD-10-CM | POA: Diagnosis present

## 2020-05-27 DIAGNOSIS — Z79899 Other long term (current) drug therapy: Secondary | ICD-10-CM

## 2020-05-27 DIAGNOSIS — D638 Anemia in other chronic diseases classified elsewhere: Secondary | ICD-10-CM | POA: Diagnosis present

## 2020-05-27 DIAGNOSIS — Z9221 Personal history of antineoplastic chemotherapy: Secondary | ICD-10-CM

## 2020-05-27 DIAGNOSIS — I110 Hypertensive heart disease with heart failure: Secondary | ICD-10-CM | POA: Diagnosis present

## 2020-05-27 DIAGNOSIS — M1990 Unspecified osteoarthritis, unspecified site: Secondary | ICD-10-CM | POA: Diagnosis present

## 2020-05-27 DIAGNOSIS — J9621 Acute and chronic respiratory failure with hypoxia: Secondary | ICD-10-CM | POA: Diagnosis present

## 2020-05-27 DIAGNOSIS — Z885 Allergy status to narcotic agent status: Secondary | ICD-10-CM

## 2020-05-27 DIAGNOSIS — E2740 Unspecified adrenocortical insufficiency: Secondary | ICD-10-CM | POA: Diagnosis present

## 2020-05-27 DIAGNOSIS — B964 Proteus (mirabilis) (morganii) as the cause of diseases classified elsewhere: Secondary | ICD-10-CM | POA: Diagnosis present

## 2020-05-27 DIAGNOSIS — J441 Chronic obstructive pulmonary disease with (acute) exacerbation: Secondary | ICD-10-CM

## 2020-05-27 DIAGNOSIS — J9622 Acute and chronic respiratory failure with hypercapnia: Secondary | ICD-10-CM | POA: Diagnosis not present

## 2020-05-27 DIAGNOSIS — E669 Obesity, unspecified: Secondary | ICD-10-CM | POA: Diagnosis present

## 2020-05-27 DIAGNOSIS — D84821 Immunodeficiency due to drugs: Secondary | ICD-10-CM | POA: Diagnosis present

## 2020-05-27 DIAGNOSIS — I272 Pulmonary hypertension, unspecified: Secondary | ICD-10-CM | POA: Diagnosis present

## 2020-05-27 DIAGNOSIS — F329 Major depressive disorder, single episode, unspecified: Secondary | ICD-10-CM | POA: Diagnosis present

## 2020-05-27 DIAGNOSIS — E871 Hypo-osmolality and hyponatremia: Secondary | ICD-10-CM | POA: Diagnosis present

## 2020-05-27 DIAGNOSIS — Z8271 Family history of polycystic kidney: Secondary | ICD-10-CM

## 2020-05-27 DIAGNOSIS — R509 Fever, unspecified: Secondary | ICD-10-CM | POA: Diagnosis present

## 2020-05-27 DIAGNOSIS — Z7982 Long term (current) use of aspirin: Secondary | ICD-10-CM

## 2020-05-27 DIAGNOSIS — Z886 Allergy status to analgesic agent status: Secondary | ICD-10-CM

## 2020-05-27 DIAGNOSIS — K219 Gastro-esophageal reflux disease without esophagitis: Secondary | ICD-10-CM | POA: Diagnosis present

## 2020-05-27 DIAGNOSIS — J9611 Chronic respiratory failure with hypoxia: Secondary | ICD-10-CM

## 2020-05-27 DIAGNOSIS — R739 Hyperglycemia, unspecified: Secondary | ICD-10-CM | POA: Diagnosis present

## 2020-05-27 DIAGNOSIS — J449 Chronic obstructive pulmonary disease, unspecified: Secondary | ICD-10-CM | POA: Diagnosis present

## 2020-05-27 DIAGNOSIS — Q613 Polycystic kidney, unspecified: Secondary | ICD-10-CM | POA: Diagnosis not present

## 2020-05-27 DIAGNOSIS — J9601 Acute respiratory failure with hypoxia: Secondary | ICD-10-CM | POA: Diagnosis not present

## 2020-05-27 DIAGNOSIS — I471 Supraventricular tachycardia: Secondary | ICD-10-CM | POA: Diagnosis present

## 2020-05-27 DIAGNOSIS — D8489 Other immunodeficiencies: Secondary | ICD-10-CM | POA: Diagnosis present

## 2020-05-27 DIAGNOSIS — R5381 Other malaise: Secondary | ICD-10-CM | POA: Diagnosis present

## 2020-05-27 DIAGNOSIS — Z794 Long term (current) use of insulin: Secondary | ICD-10-CM

## 2020-05-27 DIAGNOSIS — L89152 Pressure ulcer of sacral region, stage 2: Secondary | ICD-10-CM | POA: Diagnosis present

## 2020-05-27 DIAGNOSIS — Z20822 Contact with and (suspected) exposure to covid-19: Secondary | ICD-10-CM | POA: Diagnosis present

## 2020-05-27 DIAGNOSIS — E785 Hyperlipidemia, unspecified: Secondary | ICD-10-CM | POA: Diagnosis present

## 2020-05-27 DIAGNOSIS — N390 Urinary tract infection, site not specified: Secondary | ICD-10-CM | POA: Diagnosis present

## 2020-05-27 DIAGNOSIS — R0682 Tachypnea, not elsewhere classified: Secondary | ICD-10-CM | POA: Diagnosis not present

## 2020-05-27 DIAGNOSIS — Z7952 Long term (current) use of systemic steroids: Secondary | ICD-10-CM

## 2020-05-27 DIAGNOSIS — Z881 Allergy status to other antibiotic agents status: Secondary | ICD-10-CM

## 2020-05-27 DIAGNOSIS — R6520 Severe sepsis without septic shock: Secondary | ICD-10-CM | POA: Diagnosis not present

## 2020-05-27 DIAGNOSIS — Z6831 Body mass index (BMI) 31.0-31.9, adult: Secondary | ICD-10-CM

## 2020-05-27 DIAGNOSIS — E213 Hyperparathyroidism, unspecified: Secondary | ICD-10-CM | POA: Diagnosis present

## 2020-05-27 DIAGNOSIS — Z940 Kidney transplant status: Secondary | ICD-10-CM

## 2020-05-27 DIAGNOSIS — Z888 Allergy status to other drugs, medicaments and biological substances status: Secondary | ICD-10-CM

## 2020-05-27 DIAGNOSIS — Z9225 Personal history of immunosupression therapy: Secondary | ICD-10-CM | POA: Diagnosis not present

## 2020-05-27 DIAGNOSIS — Z9981 Dependence on supplemental oxygen: Secondary | ICD-10-CM

## 2020-05-27 DIAGNOSIS — Z87891 Personal history of nicotine dependence: Secondary | ICD-10-CM

## 2020-05-27 DIAGNOSIS — R0602 Shortness of breath: Secondary | ICD-10-CM

## 2020-05-27 DIAGNOSIS — R000 Tachycardia, unspecified: Secondary | ICD-10-CM | POA: Diagnosis not present

## 2020-05-27 DIAGNOSIS — A419 Sepsis, unspecified organism: Secondary | ICD-10-CM | POA: Diagnosis present

## 2020-05-27 DIAGNOSIS — T380X5A Adverse effect of glucocorticoids and synthetic analogues, initial encounter: Secondary | ICD-10-CM | POA: Diagnosis present

## 2020-05-27 DIAGNOSIS — Z8619 Personal history of other infectious and parasitic diseases: Secondary | ICD-10-CM

## 2020-05-27 DIAGNOSIS — Z7951 Long term (current) use of inhaled steroids: Secondary | ICD-10-CM

## 2020-05-27 DIAGNOSIS — Z8249 Family history of ischemic heart disease and other diseases of the circulatory system: Secondary | ICD-10-CM

## 2020-05-27 LAB — CBC WITH DIFFERENTIAL/PLATELET
Abs Immature Granulocytes: 0.1 10*3/uL — ABNORMAL HIGH (ref 0.00–0.07)
Basophils Absolute: 0 10*3/uL (ref 0.0–0.1)
Basophils Relative: 0 %
Eosinophils Absolute: 0.1 10*3/uL (ref 0.0–0.5)
Eosinophils Relative: 1 %
HCT: 31.3 % — ABNORMAL LOW (ref 36.0–46.0)
Hemoglobin: 9.4 g/dL — ABNORMAL LOW (ref 12.0–15.0)
Immature Granulocytes: 1 %
Lymphocytes Relative: 8 %
Lymphs Abs: 1.4 10*3/uL (ref 0.7–4.0)
MCH: 31.2 pg (ref 26.0–34.0)
MCHC: 30 g/dL (ref 30.0–36.0)
MCV: 104 fL — ABNORMAL HIGH (ref 80.0–100.0)
Monocytes Absolute: 0.6 10*3/uL (ref 0.1–1.0)
Monocytes Relative: 4 %
Neutro Abs: 14.9 10*3/uL — ABNORMAL HIGH (ref 1.7–7.7)
Neutrophils Relative %: 86 %
Platelets: 233 10*3/uL (ref 150–400)
RBC: 3.01 MIL/uL — ABNORMAL LOW (ref 3.87–5.11)
RDW: 22 % — ABNORMAL HIGH (ref 11.5–15.5)
WBC: 17.1 10*3/uL — ABNORMAL HIGH (ref 4.0–10.5)
nRBC: 0 % (ref 0.0–0.2)

## 2020-05-27 LAB — PROTIME-INR
INR: 1.2 (ref 0.8–1.2)
Prothrombin Time: 15 seconds (ref 11.4–15.2)

## 2020-05-27 LAB — I-STAT VENOUS BLOOD GAS, ED
Acid-Base Excess: 2 mmol/L (ref 0.0–2.0)
Bicarbonate: 28.8 mmol/L — ABNORMAL HIGH (ref 20.0–28.0)
Calcium, Ion: 1.23 mmol/L (ref 1.15–1.40)
HCT: 31 % — ABNORMAL LOW (ref 36.0–46.0)
Hemoglobin: 10.5 g/dL — ABNORMAL LOW (ref 12.0–15.0)
O2 Saturation: 90 %
Potassium: 4.2 mmol/L (ref 3.5–5.1)
Sodium: 133 mmol/L — ABNORMAL LOW (ref 135–145)
TCO2: 30 mmol/L (ref 22–32)
pCO2, Ven: 52 mmHg (ref 44.0–60.0)
pH, Ven: 7.351 (ref 7.250–7.430)
pO2, Ven: 63 mmHg — ABNORMAL HIGH (ref 32.0–45.0)

## 2020-05-27 LAB — URINALYSIS, ROUTINE W REFLEX MICROSCOPIC
Bilirubin Urine: NEGATIVE
Glucose, UA: NEGATIVE mg/dL
Ketones, ur: NEGATIVE mg/dL
Leukocytes,Ua: NEGATIVE
Nitrite: NEGATIVE
Protein, ur: 100 mg/dL — AB
Specific Gravity, Urine: 1.014 (ref 1.005–1.030)
pH: 6 (ref 5.0–8.0)

## 2020-05-27 LAB — COMPREHENSIVE METABOLIC PANEL
ALT: 17 U/L (ref 0–44)
AST: 20 U/L (ref 15–41)
Albumin: 2.7 g/dL — ABNORMAL LOW (ref 3.5–5.0)
Alkaline Phosphatase: 78 U/L (ref 38–126)
Anion gap: 9 (ref 5–15)
BUN: 27 mg/dL — ABNORMAL HIGH (ref 8–23)
CO2: 26 mmol/L (ref 22–32)
Calcium: 9.2 mg/dL (ref 8.9–10.3)
Chloride: 97 mmol/L — ABNORMAL LOW (ref 98–111)
Creatinine, Ser: 1.02 mg/dL — ABNORMAL HIGH (ref 0.44–1.00)
GFR calc Af Amer: >60 mL/min (ref >60)
GFR calc non Af Amer: 58 mL/min — ABNORMAL LOW (ref >60)
Glucose, Bld: 136 mg/dL — ABNORMAL HIGH (ref 70–99)
Potassium: 4.1 mmol/L (ref 3.5–5.1)
Sodium: 132 mmol/L — ABNORMAL LOW (ref 135–145)
Total Bilirubin: 1.6 mg/dL — ABNORMAL HIGH (ref 0.3–1.2)
Total Protein: 7.2 g/dL (ref 6.5–8.1)

## 2020-05-27 LAB — BRAIN NATRIURETIC PEPTIDE: B Natriuretic Peptide: 183.3 pg/mL — ABNORMAL HIGH (ref 0.0–100.0)

## 2020-05-27 LAB — SARS CORONAVIRUS 2 BY RT PCR (HOSPITAL ORDER, PERFORMED IN ~~LOC~~ HOSPITAL LAB): SARS Coronavirus 2: NEGATIVE

## 2020-05-27 LAB — TROPONIN I (HIGH SENSITIVITY)
Troponin I (High Sensitivity): 9 ng/L (ref <18)
Troponin I (High Sensitivity): 7 ng/L (ref <18)

## 2020-05-27 LAB — LACTIC ACID, PLASMA: Lactic Acid, Venous: 1.1 mmol/L (ref 0.5–1.9)

## 2020-05-27 LAB — MRSA PCR SCREENING: MRSA by PCR: NEGATIVE

## 2020-05-27 LAB — APTT: aPTT: 36 seconds (ref 24–36)

## 2020-05-27 MED ORDER — SODIUM CHLORIDE 0.9 % IV SOLN
2.0000 g | Freq: Once | INTRAVENOUS | Status: AC
  Administered 2020-05-27: 2 g via INTRAVENOUS
  Filled 2020-05-27: qty 2

## 2020-05-27 MED ORDER — VANCOMYCIN HCL 1500 MG/300ML IV SOLN
1500.0000 mg | Freq: Once | INTRAVENOUS | Status: AC
  Administered 2020-05-27: 1500 mg via INTRAVENOUS
  Filled 2020-05-27: qty 300

## 2020-05-27 MED ORDER — LACTATED RINGERS IV BOLUS (SEPSIS): 500.0000 mL | Freq: Once | INTRAVENOUS | Status: DC

## 2020-05-27 MED ORDER — SODIUM CHLORIDE 0.9 % IV SOLN: 500.0000 mg | Freq: Once | INTRAVENOUS | Status: DC

## 2020-05-27 MED ORDER — ONDANSETRON HCL 4 MG PO TABS: 4.0000 mg | ORAL_TABLET | Freq: Four times a day (QID) | ORAL | Status: DC | PRN

## 2020-05-27 MED ORDER — ENOXAPARIN SODIUM 40 MG/0.4ML ~~LOC~~ SOLN
40.0000 mg | SUBCUTANEOUS | Status: DC
  Administered 2020-05-27 – 2020-05-29 (×3): 40 mg via SUBCUTANEOUS
  Filled 2020-05-27 (×3): qty 0.4

## 2020-05-27 MED ORDER — COLLAGENASE 250 UNIT/GM EX OINT
1.0000 "application " | TOPICAL_OINTMENT | Freq: Every day | CUTANEOUS | Status: DC
  Administered 2020-05-28 – 2020-06-06 (×10): 1 via TOPICAL
  Filled 2020-05-27: qty 30

## 2020-05-27 MED ORDER — METHYLPREDNISOLONE SODIUM SUCC 125 MG IJ SOLR
60.0000 mg | Freq: Three times a day (TID) | INTRAMUSCULAR | Status: DC
  Administered 2020-05-28: 60 mg via INTRAVENOUS
  Filled 2020-05-27: qty 2

## 2020-05-27 MED ORDER — INSULIN ASPART 100 UNIT/ML ~~LOC~~ SOLN
0.0000 [IU] | Freq: Three times a day (TID) | SUBCUTANEOUS | Status: DC
  Administered 2020-05-28 (×2): 2 [IU] via SUBCUTANEOUS
  Administered 2020-05-28: 3 [IU] via SUBCUTANEOUS
  Administered 2020-05-29 (×3): 9 [IU] via SUBCUTANEOUS
  Administered 2020-05-30 (×2): 1 [IU] via SUBCUTANEOUS
  Administered 2020-05-30 – 2020-05-31 (×2): 2 [IU] via SUBCUTANEOUS
  Administered 2020-06-01: 3 [IU] via SUBCUTANEOUS
  Administered 2020-06-02: 2 [IU] via SUBCUTANEOUS
  Administered 2020-06-03: 1 [IU] via SUBCUTANEOUS
  Administered 2020-06-05 (×3): 9 [IU] via SUBCUTANEOUS
  Administered 2020-06-06: 1 [IU] via SUBCUTANEOUS
  Administered 2020-06-06: 2 [IU] via SUBCUTANEOUS

## 2020-05-27 MED ORDER — ACETAMINOPHEN 500 MG PO TABS
1000.0000 mg | ORAL_TABLET | Freq: Once | ORAL | Status: AC
  Administered 2020-05-27: 1000 mg via ORAL
  Filled 2020-05-27: qty 2

## 2020-05-27 MED ORDER — VERAPAMIL HCL ER 240 MG PO TBCR
240.0000 mg | EXTENDED_RELEASE_TABLET | Freq: Every day | ORAL | Status: DC
  Administered 2020-05-28 – 2020-06-06 (×10): 240 mg via ORAL
  Filled 2020-05-27 (×11): qty 1

## 2020-05-27 MED ORDER — ADULT MULTIVITAMIN W/MINERALS CH
1.0000 | ORAL_TABLET | Freq: Every day | ORAL | Status: DC
  Administered 2020-05-28 – 2020-06-06 (×10): 1 via ORAL
  Filled 2020-05-27 (×11): qty 1

## 2020-05-27 MED ORDER — SODIUM CHLORIDE 0.9 % IV SOLN: 2.0000 g | Freq: Once | INTRAVENOUS | Status: DC

## 2020-05-27 MED ORDER — LACTATED RINGERS IV BOLUS
1000.0000 mL | Freq: Once | INTRAVENOUS | Status: AC
  Administered 2020-05-27: 1000 mL via INTRAVENOUS

## 2020-05-27 MED ORDER — FERROUS SULFATE 325 (65 FE) MG PO TABS
325.0000 mg | ORAL_TABLET | Freq: Every day | ORAL | Status: DC
  Administered 2020-05-28 – 2020-06-06 (×12): 325 mg via ORAL
  Filled 2020-05-27 (×11): qty 1

## 2020-05-27 MED ORDER — VALACYCLOVIR HCL 500 MG PO TABS
500.0000 mg | ORAL_TABLET | Freq: Two times a day (BID) | ORAL | Status: DC
  Administered 2020-05-27 – 2020-06-06 (×20): 500 mg via ORAL
  Filled 2020-05-27 (×21): qty 1

## 2020-05-27 MED ORDER — DORZOLAMIDE HCL 2 % OP SOLN
1.0000 [drp] | Freq: Every day | OPHTHALMIC | Status: DC
  Administered 2020-05-28 – 2020-06-06 (×11): 1 [drp] via OPHTHALMIC
  Filled 2020-05-27 (×2): qty 10

## 2020-05-27 MED ORDER — SODIUM CHLORIDE 0.9 % IV SOLN: Freq: Once | INTRAVENOUS | Status: AC

## 2020-05-27 MED ORDER — HYDROMORPHONE HCL 1 MG/ML IJ SOLN
0.5000 mg | Freq: Once | INTRAMUSCULAR | Status: AC
  Administered 2020-05-27: 0.5 mg via INTRAVENOUS
  Filled 2020-05-27: qty 1

## 2020-05-27 MED ORDER — BRIMONIDINE TARTRATE 0.15 % OP SOLN
1.0000 [drp] | Freq: Two times a day (BID) | OPHTHALMIC | Status: DC
  Administered 2020-05-27 – 2020-06-06 (×21): 1 [drp] via OPHTHALMIC
  Filled 2020-05-27 (×2): qty 5

## 2020-05-27 MED ORDER — BUDESONIDE 1 MG/2ML IN SUSP: 1.0000 mg | Freq: Two times a day (BID) | RESPIRATORY_TRACT | Status: DC

## 2020-05-27 MED ORDER — SODIUM CHLORIDE 0.9% FLUSH
3.0000 mL | Freq: Two times a day (BID) | INTRAVENOUS | Status: DC
  Administered 2020-05-28 – 2020-06-06 (×18): 3 mL via INTRAVENOUS

## 2020-05-27 MED ORDER — SODIUM CHLORIDE 0.9 % IV SOLN
2.0000 g | Freq: Two times a day (BID) | INTRAVENOUS | Status: DC
  Administered 2020-05-27 – 2020-05-29 (×4): 2 g via INTRAVENOUS
  Filled 2020-05-27 (×4): qty 2

## 2020-05-27 MED ORDER — ACETAMINOPHEN 325 MG PO TABS
650.0000 mg | ORAL_TABLET | Freq: Four times a day (QID) | ORAL | Status: DC | PRN
  Administered 2020-06-03 – 2020-06-04 (×2): 650 mg via ORAL
  Filled 2020-05-27 (×2): qty 2

## 2020-05-27 MED ORDER — LACTATED RINGERS IV BOLUS: 500.0000 mL | Freq: Once | INTRAVENOUS | Status: DC

## 2020-05-27 MED ORDER — SODIUM CHLORIDE 0.9 % IV SOLN: INTRAVENOUS | Status: DC

## 2020-05-27 MED ORDER — LEVALBUTEROL HCL 0.63 MG/3ML IN NEBU
0.6300 mg | INHALATION_SOLUTION | Freq: Four times a day (QID) | RESPIRATORY_TRACT | Status: DC
  Administered 2020-05-27 – 2020-05-28 (×2): 0.63 mg via RESPIRATORY_TRACT
  Filled 2020-05-27 (×2): qty 3

## 2020-05-27 MED ORDER — ISOSORBIDE MONONITRATE ER 60 MG PO TB24
60.0000 mg | ORAL_TABLET | Freq: Every day | ORAL | Status: DC
  Administered 2020-05-28 – 2020-06-06 (×10): 60 mg via ORAL
  Filled 2020-05-27 (×10): qty 1

## 2020-05-27 MED ORDER — FLUOXETINE HCL 20 MG PO CAPS
40.0000 mg | ORAL_CAPSULE | Freq: Every day | ORAL | Status: DC
  Administered 2020-05-28 – 2020-06-06 (×10): 40 mg via ORAL
  Filled 2020-05-27 (×11): qty 2

## 2020-05-27 MED ORDER — SODIUM CHLORIDE 0.9 % IV SOLN: 1.0000 g | Freq: Once | INTRAVENOUS | Status: DC

## 2020-05-27 MED ORDER — PROMETHAZINE HCL 25 MG/ML IJ SOLN
25.0000 mg | Freq: Four times a day (QID) | INTRAMUSCULAR | Status: DC | PRN
  Administered 2020-05-27: 25 mg via INTRAVENOUS
  Filled 2020-05-27 (×2): qty 1

## 2020-05-27 MED ORDER — ALLOPURINOL 300 MG PO TABS
300.0000 mg | ORAL_TABLET | Freq: Every day | ORAL | Status: DC
  Administered 2020-05-28 – 2020-06-06 (×10): 300 mg via ORAL
  Filled 2020-05-27 (×10): qty 1

## 2020-05-27 MED ORDER — ACETAMINOPHEN 650 MG RE SUPP: 650.0000 mg | Freq: Four times a day (QID) | RECTAL | Status: DC | PRN

## 2020-05-27 MED ORDER — METHYLPREDNISOLONE SODIUM SUCC 125 MG IJ SOLR
125.0000 mg | Freq: Once | INTRAMUSCULAR | Status: AC
  Administered 2020-05-27: 125 mg via INTRAVENOUS
  Filled 2020-05-27: qty 2

## 2020-05-27 MED ORDER — LATANOPROST 0.005 % OP SOLN
1.0000 [drp] | Freq: Every day | OPHTHALMIC | Status: DC
  Administered 2020-05-27 – 2020-06-05 (×11): 1 [drp] via OPHTHALMIC
  Filled 2020-05-27: qty 2.5

## 2020-05-27 MED ORDER — VITAMIN B-12 1000 MCG PO TABS
1000.0000 ug | ORAL_TABLET | Freq: Every day | ORAL | Status: DC
  Administered 2020-05-28 – 2020-06-06 (×10): 1000 ug via ORAL
  Filled 2020-05-27 (×10): qty 1

## 2020-05-27 MED ORDER — PANTOPRAZOLE SODIUM 40 MG PO TBEC
40.0000 mg | DELAYED_RELEASE_TABLET | Freq: Two times a day (BID) | ORAL | Status: DC
  Administered 2020-05-28 – 2020-06-06 (×19): 40 mg via ORAL
  Filled 2020-05-27 (×20): qty 1

## 2020-05-27 MED ORDER — ASPIRIN 81 MG PO CHEW
81.0000 mg | CHEWABLE_TABLET | Freq: Every day | ORAL | Status: DC
  Administered 2020-05-28 – 2020-06-06 (×10): 81 mg via ORAL
  Filled 2020-05-27 (×10): qty 1

## 2020-05-27 MED ORDER — ONDANSETRON HCL 4 MG/2ML IJ SOLN: 4.0000 mg | Freq: Four times a day (QID) | INTRAMUSCULAR | Status: DC | PRN

## 2020-05-27 MED ORDER — BUDESONIDE 0.5 MG/2ML IN SUSP
1.0000 mg | Freq: Two times a day (BID) | RESPIRATORY_TRACT | Status: DC
  Administered 2020-05-28 – 2020-06-01 (×9): 1 mg via RESPIRATORY_TRACT
  Administered 2020-06-02: 0.5 mg via RESPIRATORY_TRACT
  Administered 2020-06-02 – 2020-06-06 (×8): 1 mg via RESPIRATORY_TRACT
  Filled 2020-05-27 (×16): qty 4
  Filled 2020-05-27 (×3): qty 2
  Filled 2020-05-27 (×3): qty 4

## 2020-05-27 MED ORDER — SENNOSIDES-DOCUSATE SODIUM 8.6-50 MG PO TABS
2.0000 | ORAL_TABLET | Freq: Every day | ORAL | Status: DC
  Administered 2020-05-28 – 2020-06-06 (×10): 2 via ORAL
  Filled 2020-05-27 (×11): qty 2

## 2020-05-27 MED ORDER — MYCOPHENOLATE SODIUM 180 MG PO TBEC
180.0000 mg | DELAYED_RELEASE_TABLET | Freq: Two times a day (BID) | ORAL | Status: DC
  Administered 2020-05-27 – 2020-06-06 (×20): 180 mg via ORAL
  Filled 2020-05-27 (×20): qty 1

## 2020-05-27 MED ORDER — TACROLIMUS 0.5 MG PO CAPS
0.5000 mg | ORAL_CAPSULE | Freq: Every day | ORAL | Status: DC
  Administered 2020-05-27 – 2020-06-05 (×10): 0.5 mg via ORAL
  Filled 2020-05-27 (×11): qty 1

## 2020-05-27 MED ORDER — MAGNESIUM OXIDE 400 (241.3 MG) MG PO TABS
400.0000 mg | ORAL_TABLET | Freq: Two times a day (BID) | ORAL | Status: DC
  Administered 2020-05-28 – 2020-06-06 (×19): 400 mg via ORAL
  Filled 2020-05-27 (×22): qty 1

## 2020-05-27 MED ORDER — INSULIN ASPART 100 UNIT/ML ~~LOC~~ SOLN
0.0000 [IU] | Freq: Every day | SUBCUTANEOUS | Status: DC
  Administered 2020-06-05: 2 [IU] via SUBCUTANEOUS

## 2020-05-27 MED ORDER — MORPHINE SULFATE (PF) 2 MG/ML IV SOLN: 2.0000 mg | INTRAVENOUS | Status: DC

## 2020-05-27 MED ORDER — GUAIFENESIN ER 600 MG PO TB12
600.0000 mg | ORAL_TABLET | Freq: Two times a day (BID) | ORAL | Status: DC
  Administered 2020-05-28 – 2020-06-06 (×19): 600 mg via ORAL
  Filled 2020-05-27 (×20): qty 1

## 2020-05-27 MED ORDER — GABAPENTIN 100 MG PO CAPS
200.0000 mg | ORAL_CAPSULE | Freq: Three times a day (TID) | ORAL | Status: DC
  Administered 2020-05-27 – 2020-06-06 (×30): 200 mg via ORAL
  Filled 2020-05-27 (×30): qty 2

## 2020-05-27 MED ORDER — CYCLOSPORINE 0.05 % OP EMUL
1.0000 [drp] | Freq: Two times a day (BID) | OPHTHALMIC | Status: DC
  Administered 2020-05-27 – 2020-06-06 (×20): 1 [drp] via OPHTHALMIC
  Filled 2020-05-27 (×21): qty 1

## 2020-05-27 MED ORDER — TACROLIMUS 1 MG PO CAPS
1.0000 mg | ORAL_CAPSULE | Freq: Every day | ORAL | Status: DC
  Administered 2020-05-28 – 2020-06-06 (×11): 1 mg via ORAL
  Filled 2020-05-27 (×10): qty 1

## 2020-05-27 MED ORDER — IPRATROPIUM BROMIDE 0.02 % IN SOLN: 0.5000 mg | Freq: Four times a day (QID) | RESPIRATORY_TRACT | Status: DC | PRN

## 2020-05-27 NOTE — H&P (Signed)
History and Physical    April [name] IWL:[id number] DOB: [birth year] DOA: 05/27/2020  Referring MD/NP/PA: Pati Gallo, PA-C PCP: [name], MD  Patient coming from: [facility] facility via EMS  Chief Complaint: Shortness of breath  I have personally briefly reviewed patient's old medical records in [place]   HPI: April [name] is a 64 y.o. female with medical history significant of polycystic kidney disease s/p renal transplant on chronic immunosuppressive therapy, COPD, chronic respiratory failure with hypoxia on 2 L of nasal cannula oxygen at baseline, diastolic CHF, and pulmonary hypertension.  She presents with complaints of progressively worsening shortness of breath over the last 2 days.  She just recently been hospitalized from 6/21-6/30 with sepsis of origin.  Reports nursing staff placed her back on oxygen Saturday night due to her labored breathing.  Notes associated symptoms of wheezing, some chest discomfort feels like when she gets bronchitis, and headache.  Denies having any significant cough, sputum production, or fever.  Review of records since April patient has had several admissions with what appears to be sepsis of unclear origin.    ED Course: Upon admission into the emergency department patient was seen to be febrile up to 103 F with pulse 10 8-1 26, respiration 22-25, and all other vital signs relatively maintained at this time.  Labs significant for WBC 17.1, hemoglobin 9.4, sodium 132, BUN 27, creatinine 1.02, BNP 183.3, troponin 9,  and lactic acid 1.1.  Chest x-ray showed no acute abnormalities and urinalysis was not significant for infection.  Blood cultures were obtained.  Patient was given 2 L of lactated Ringer's, vancomycin, cefepime, and acetaminophen for her fever.   Review of Systems  Constitutional: Positive for malaise/fatigue. Negative for fever.  HENT: Negative for congestion and nosebleeds.   Eyes: Negative for  photophobia and pain.  Respiratory: Positive for shortness of breath and wheezing. Negative for cough and sputum production.   Cardiovascular: Positive for chest pain. Negative for leg swelling.  Gastrointestinal: Negative for abdominal pain, nausea and vomiting.  Genitourinary: Negative for dysuria and hematuria.  Skin: Negative for itching.  Neurological: Positive for headaches. Negative for speech change and loss of consciousness.  Psychiatric/Behavioral: Negative for hallucinations and substance abuse.    Past Medical History:  Diagnosis Date  . Arthritis   . Asthma   . CHF (congestive heart failure) ([place])   . COPD (chronic obstructive pulmonary disease) ([place])   . Depression   . Essential hypertension 02/08/2020  . GERD (gastroesophageal reflux disease)   . Hyperlipidemia   . Hyperparathyroidism   . Polycystic kidney   . PONV (postoperative nausea and vomiting)     Past Surgical History:  Procedure Laterality Date  . 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 BILATERAL OOPHORECTOMY    . BIOPSY  05/19/2020   Procedure: BIOPSY;  Surgeon: Carol Ada, MD;  Location: [facility];  Service: Endoscopy;;  . CARPAL TUNNEL RELEASE  2000  . CESAREAN SECTION    . COLONOSCOPY WITH PROPOFOL N/A 05/19/2020   Procedure: COLONOSCOPY WITH PROPOFOL;  Surgeon: Carol Ada, MD;  Location: [place];  Service: Endoscopy;  Laterality: N/A;  . ENTEROSCOPY N/A 05/19/2020   Procedure: ENTEROSCOPY;  Surgeon: Carol Ada, MD;  Location: [place];  Service: Endoscopy;  Laterality: N/A;  . INSERTION OF DIALYSIS CATHETER  03/31/2012   Procedure: INSERTION OF DIALYSIS CATHETER;  Surgeon: Angelia Mould, MD;  Location: [place];  Service: Vascular;  Laterality:  N/A;  insertion of dialysis catheter right internal jugular vein  . 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 ORIF TIBIA FRACTURE Left 12/23/2018   Procedure: OPEN REDUCTION INTERNAL FIXATION (ORIF) TIBIA FRACTURE;   Surgeon: Shona Needles, MD;  Location: [place];  Service: Orthopedics;  Laterality: Left;  . ORIF TIBIA PLATEAU Right 12/23/2018   Procedure: OPEN REDUCTION INTERNAL FIXATION (ORIF) TIBIAL PLATEAU;  Surgeon: Shona Needles, MD;  Location: [place];  Service: Orthopedics;  Laterality: Right;  . ORIF WRIST FRACTURE Left 12/08/2019   Procedure: OPEN REDUCTION INTERNAL FIXATION (ORIF) WRIST FRACTURE, REPAIR LACERATION LEFT FOREARM;  Surgeon: Dorna Leitz, MD;  Location: [facility];  Service: Orthopedics;  Laterality: Left;  . TONSILLECTOMY  1967  . TOTAL HIP ARTHROPLASTY Left 12/08/2019   Procedure: TOTAL HIP ARTHROPLASTY ANTERIOR APPROACH;  Surgeon: Dorna Leitz, MD;  Location: [facility];  Service: Orthopedics;  Laterality: Left;  . TUBAL LIGATION  2010     reports that she quit smoking about 22 years ago. Her smoking use included cigarettes. She has a 3.75 pack-year smoking history. She has never used smokeless tobacco. She reports that she does not drink alcohol and does not use drugs.  Allergies  Allergen Reactions  . Infed [Iron Dextran] Other (See Comments)    Chest tightness  . Pentamidine Itching, Shortness Of Breath and Swelling  . Erythromycin [Erythromycin] Other (See Comments)    Mouth Ulcers  . Iohexol Other (See Comments)    Per patient "she has had a kidney transplant and should never have contrast"  . Oxycodone Nausea Only and Nausea And Vomiting  . Erythromycin Rash    Causes breakout in mouth  . Ultram [Tramadol Hcl] Anxiety    Family History  Problem Relation Age of Onset  . Hypertension Mother   . Heart disease Father        CABG history  . Polycystic kidney disease Father   . Polycystic kidney disease Brother     Prior to Admission medications   Medication Sig Start Date End Date Taking? Authorizing Provider  acetaminophen (TYLENOL) 325 MG tablet Take 2 tablets (650 mg total) by mouth every 6 (six) hours as needed for mild pain (or Fever >/= 101). 05/22/20  Yes Elgergawy,  Silver Huguenin, MD  allopurinol (ZYLOPRIM) 300 MG tablet Take 1 tablet (300 mg total) by mouth daily. 01/04/20  Yes Angiulli, Lavon Paganini, PA-C  ascorbic acid (VITAMIN C) 500 MG tablet Take 500 mg by mouth daily.   Yes [provider]  aspirin 81 MG chewable tablet Chew 81 mg by mouth daily.   Yes [provider]  brimonidine (ALPHAGAN P) 0.1 % SOLN Place 1 drop into both eyes 2 (two) times daily.    Yes [provider]  budesonide (PULMICORT) 1 MG/2ML nebulizer solution Take 1 mg by nebulization in the morning and at bedtime.   Yes [provider]  cholecalciferol (VITAMIN D3) 25 MCG (1000 UNIT) tablet Take 1 tablet (1,000 Units total) by mouth daily. 01/04/20  Yes Angiulli, Lavon Paganini, PA-C  collagenase (SANTYL) ointment Apply 1 application topically daily. Sacral wound   Yes [provider]  cyanocobalamin 1000 MCG tablet Take 1,000 mcg by mouth daily.   Yes [provider]  cycloSPORINE (RESTASIS) 0.05 % ophthalmic emulsion Place 1 drop into both eyes 2 (two) times daily. 01/04/20  Yes Angiulli, Lavon Paganini, PA-C  dextromethorphan-guaiFENesin (MUCINEX DM) 30-600 MG 12hr tablet Take 1 tablet by mouth 2 (two) times daily as  needed for cough. 04/19/20  Yes Amin, Ankit Chirag, MD  dorzolamide (TRUSOPT) 2 % ophthalmic solution Place 1 drop into both eyes daily. 06/25/19  Yes [provider]  ferrous sulfate 325 (65 FE) MG tablet Take 325 mg by mouth daily with breakfast.   Yes [provider]  FLUoxetine (PROZAC) 40 MG capsule Take 1 capsule (40 mg total) by mouth daily. 01/04/20  Yes Angiulli, Lavon Paganini, PA-C  furosemide (LASIX) 40 MG tablet Take 1 tablet (40 mg total) by mouth daily. 02/07/20  Yes Sheikh, Omair Latif, DO  gabapentin (NEURONTIN) 100 MG capsule Take 200 mg by mouth 3 (three) times daily.   Yes [provider]  guaiFENesin (MUCINEX) 600 MG 12 hr tablet Take 1 tablet (600 mg total) by mouth 2 (two) times daily. 02/06/20  Yes  Sheikh, Omair Latif, DO  insulin aspart (NOVOLOG) 100 UNIT/ML injection Inject 2-12 Units into the skin See admin instructions. Per sliding scale subcutaneous, once a day - If blood sugar is: 0 - 200: 0 units  201 - 250: 2 units  251 - 300: 4 units  301 - 350: 6 units  351 - 400: 8 units  401 - 450: 10 units  451 - 500: 12 units  500 or greater: Call MD and give 14 units, then recheck blood sugar in 2 hours, at that time if blood sugar is greater than 400 or less than 80, call PEC TRIAGE.   Yes [provider]  ipratropium (ATROVENT HFA) 17 MCG/ACT inhaler Inhale 1 puff into the lungs every 6 (six) hours. For COPD   Yes [provider]  isosorbide mononitrate (IMDUR) 60 MG 24 hr tablet Take 1 tablet (60 mg total) by mouth daily. 05/23/20  Yes Elgergawy, Silver Huguenin, MD  levalbuterol (XOPENEX) 0.63 MG/3ML nebulizer solution Take 0.63 mg by nebulization See admin instructions. Inhale 0.63mg  once daily.  May take 0.63mg  every six hours as needed COPD.   Yes [provider]  magnesium oxide (MAG-OX) 400 MG tablet Take 1 tablet (400 mg total) by mouth 2 (two) times daily. 01/04/20  Yes Angiulli, Lavon Paganini, PA-C  Multiple Vitamin (MULTIVITAMIN WITH MINERALS) TABS tablet Take 1 tablet by mouth daily. 02/24/20  Yes Alma Friendly, MD  mycophenolate (MYFORTIC) 180 MG EC tablet Take 180 mg by mouth 2 (two) times daily.   Yes [provider]  pantoprazole (PROTONIX) 40 MG tablet Take 1 tablet (40 mg total) by mouth 2 (two) times daily. 01/04/20  Yes Angiulli, Lavon Paganini, PA-C  predniSONE (DELTASONE) 2.5 MG tablet Take 2.5 mg by mouth daily with breakfast. Takes with 5mg  tablet for total dose of 7.5 mg   Yes [provider]  predniSONE (DELTASONE) 5 MG tablet Take 1.5 tablets (7.5 mg total) by mouth daily with breakfast. Patient taking differently: Take 5 mg by mouth daily with breakfast. Take with the 2.5mg  tablet for a total dose of 7.5mg  05/25/20  Yes Elgergawy, Silver Huguenin, MD  PROGRAF 1 MG capsule Take 1 mg by mouth daily. 03/22/19  Yes [provider]  sennosides-docusate sodium (SENOKOT-S) 8.6-50 MG tablet Take 2 tablets by mouth daily.    Yes [provider]  tacrolimus (PROGRAF) 0.5 MG capsule Take 0.5 mg by mouth at bedtime. Takes brand name Prograf   Yes [provider]  TRAVATAN Z 0.004 % SOLN ophthalmic solution Place 1 drop into both eyes at bedtime. 07/03/16  Yes [provider]  valACYclovir (VALTREX) 500 MG tablet Take 500  mg by mouth 2 (two) times daily.   Yes [provider]  verapamil (CALAN-SR) 240 MG CR tablet Take 240 mg by mouth daily.   Yes [provider]  albuterol (VENTOLIN HFA) 108 (90 Base) MCG/ACT inhaler Inhale 2 puffs into the lungs every 4 (four) hours as needed for wheezing or shortness of breath. Patient not taking: Reported on 05/27/2020 01/04/20   Angiulli, Lavon Paganini, PA-C  budesonide (PULMICORT) 0.5 MG/2ML nebulizer solution Take 2 mLs (0.5 mg total) by nebulization 2 (two) times daily. Patient not taking: Reported on 05/27/2020 04/19/20   Damita Lack, MD  docusate sodium (COLACE) 100 MG capsule Take 1 capsule (100 mg total) by mouth 2 (two) times daily. Patient not taking: Reported on 05/27/2020 12/13/19   Shelly Coss, MD  feeding supplement, ENSURE ENLIVE, (ENSURE ENLIVE) LIQD Take 237 mLs by mouth 2 (two) times daily between meals. Patient not taking: Reported on 05/27/2020 02/23/20   Alma Friendly, MD  SYMBICORT 160-4.5 MCG/ACT inhaler Inhale 2 puffs into the lungs 2 (two) times daily. Patient not taking: Reported on 05/27/2020 01/04/20   Angiulli, Lavon Paganini, PA-C  verapamil (CALAN-SR) 180 MG CR tablet Take 1 tablet (180 mg total) by mouth daily. Patient not taking: Reported on 05/27/2020 05/23/20   Elgergawy, Silver Huguenin, MD    Physical Exam:  Constitutional: Older female who appears to be chronically ill and currently in respiratory distress. Vitals:   05/27/20 1014  05/27/20 1157 05/27/20 1302 05/27/20 1315  BP:  (!) 153/73 125/67   Pulse:  (!) 119 (!) 108   Resp:  (!) 25 (!) 22   Temp: (!) 103 F (39.4 C)   (!) 100.8 F (38.2 C)  TempSrc: Rectal   Rectal  SpO2:  99% 100%   Weight:      Height:       Eyes: PERRL, lids and conjunctivae normal ENMT: Mucous membranes are moist. Posterior pharynx clear of any exudate or lesions.  Neck: normal, supple, no masses, no thyromegaly.  No significant JVD appreciated. Respiratory: Tachypneic with decreased aeration and expiratory wheeze appreciated.  Patient currently on 2 L nasal cannula oxygen and able to talk in shortened sentences. Cardiovascular: Regular rate and rhythm, no murmurs / rubs / gallops.  Trace lower extremity edema. 2+ pedal pulses. No carotid bruits.  Abdomen: no tenderness, no masses palpated. No hepatosplenomegaly. Bowel sounds positive.  Musculoskeletal: no clubbing / cyanosis. No joint deformity upper and lower extremities. Good ROM, no contractures. Normal muscle tone.  Skin: Stage II pressure ulcer Neurologic: CN 2-12 grossly intact. Sensation intact, DTR normal. Strength 5/5 in all 4.  Psychiatric: Normal judgment and insight. Alert and oriented x 3. Normal mood.     Labs on Admission: I have personally reviewed following labs and imaging studies  CBC: Recent Labs  Lab 05/27/20 1020 05/27/20 1045  WBC 17.1*  --   NEUTROABS 14.9*  --   HGB 9.4* 10.5*  HCT 31.3* 31.0*  MCV 104.0*  --   PLT 233  --    Basic Metabolic Panel: Recent Labs  Lab 05/22/20 0509 05/27/20 1020 05/27/20 1045  NA  --  132* 133*  K 4.0 4.1 4.2  CL  --  97*  --   CO2  --  26  --   GLUCOSE  --  136*  --   BUN  --  27*  --   CREATININE  --  1.02*  --   CALCIUM  --  9.2  --  MG 2.2  --   --    GFR: Estimated Creatinine Clearance: 51.2 mL/min (A) (by C-G formula based on SCr of 1.02 mg/dL (H)). Liver Function Tests: Recent Labs  Lab 05/27/20 1020  AST 20  ALT 17  ALKPHOS 78  BILITOT  1.6*  PROT 7.2  ALBUMIN 2.7*   No results for input(s): LIPASE, AMYLASE in the last 168 hours. No results for input(s): AMMONIA in the last 168 hours. Coagulation Profile: Recent Labs  Lab 05/27/20 1020  INR 1.2   Cardiac Enzymes: No results for input(s): CKTOTAL, CKMB, CKMBINDEX, TROPONINI in the last 168 hours. BNP (last 3 results) No results for input(s): PROBNP in the last 8760 hours. HbA1C: No results for input(s): HGBA1C in the last 72 hours. CBG: Recent Labs  Lab 05/21/20 1156 05/21/20 1621 05/21/20 2111 05/22/20 0750 05/22/20 1223  GLUCAP 117* 189* 238* 72 155*   Lipid Profile: No results for input(s): CHOL, HDL, LDLCALC, TRIG, CHOLHDL, LDLDIRECT in the last 72 hours. Thyroid Function Tests: No results for input(s): TSH, T4TOTAL, FREET4, T3FREE, THYROIDAB in the last 72 hours. Anemia Panel: No results for input(s): VITAMINB12, FOLATE, FERRITIN, TIBC, IRON, RETICCTPCT in the last 72 hours. Urine analysis:    Component Value Date/Time   COLORURINE YELLOW 05/27/2020 1007   APPEARANCEUR CLEAR 05/27/2020 1007   LABSPEC 1.014 05/27/2020 1007   PHURINE 6.0 05/27/2020 1007   GLUCOSEU NEGATIVE 05/27/2020 1007   HGBUR SMALL (A) 05/27/2020 1007   BILIRUBINUR NEGATIVE 05/27/2020 1007   KETONESUR NEGATIVE 05/27/2020 1007   PROTEINUR 100 (A) 05/27/2020 1007   UROBILINOGEN 0.2 05/15/2013 0733   NITRITE NEGATIVE 05/27/2020 1007   LEUKOCYTESUR NEGATIVE 05/27/2020 1007   Sepsis Labs: Recent Results (from the past 240 hour(s))  SARS CORONAVIRUS 2 (TAT 6-24 HRS) Nasopharyngeal Nasopharyngeal Swab     Status: None   Collection Time: 05/20/20  1:19 PM   Specimen: Nasopharyngeal Swab  Result Value Ref Range Status   SARS Coronavirus 2 NEGATIVE NEGATIVE Final    Comment: (NOTE) SARS-CoV-2 target nucleic acids are NOT DETECTED.  The SARS-CoV-2 RNA is generally detectable in upper and lower respiratory specimens during the acute phase of infection. Negative results do not  preclude SARS-CoV-2 infection, do not rule out co-infections with other pathogens, and should not be used as the sole basis for treatment or other patient management decisions. Negative results must be combined with clinical observations, patient history, and epidemiological information. The expected result is Negative.  Fact Sheet for Patients: SugarRoll.be  Fact Sheet for Healthcare Providers: https://www.woods-mathews.com/  This test is not yet approved or cleared by the [place] FDA and  has been authorized for detection and/or diagnosis of SARS-CoV-2 by FDA under an Emergency Use Authorization (EUA). This EUA will remain  in effect (meaning this test can be used) for the duration of the COVID-19 declaration under Se ction 564(b)(1) of the Act, 21 U.S.C. section 360bbb-3(b)(1), unless the authorization is terminated or revoked sooner.  Performed at [facility], [place] [address] [place], [place] [address]   Blood Culture (routine x 2)     Status: None (Preliminary result)   Collection Time: 05/27/20 10:12 AM   Specimen: BLOOD  Result Value Ref Range Status   Specimen Description BLOOD RIGHT ANTECUBITAL  Final   Special Requests   Final    BOTTLES DRAWN AEROBIC AND ANAEROBIC Blood Culture results may not be optimal due to an inadequate volume of blood received in culture bottles Performed at [facility], [place] [address], [place], [place] [address]    Culture PENDING  Incomplete   Report Status PENDING  Incomplete  SARS Coronavirus 2 by RT PCR (hospital order, performed in [facility] hospital lab) Nasopharyngeal Nasopharyngeal Swab     Status: None   Collection Time: 05/27/20 11:18 AM   Specimen: Nasopharyngeal Swab  Result Value Ref Range Status   SARS Coronavirus 2 NEGATIVE NEGATIVE Final    Comment: (NOTE) SARS-CoV-2 target nucleic acids are NOT DETECTED.  The SARS-CoV-2 RNA is generally detectable in upper and  lower respiratory specimens during the acute phase of infection. The lowest concentration of SARS-CoV-2 viral copies this assay can detect is 250 copies / mL. A negative result does not preclude SARS-CoV-2 infection and should not be used as the sole basis for treatment or other patient management decisions.  A negative result may occur with improper specimen collection / handling, submission of specimen other than nasopharyngeal swab, presence of viral mutation(s) within the areas targeted by this assay, and inadequate number of viral copies (<250 copies / mL). A negative result must be combined with clinical observations, patient history, and epidemiological information.  Fact Sheet for Patients:   StrictlyIdeas.no  Fact Sheet for Healthcare Providers: BankingDealers.co.za  This test is not yet approved or  cleared by the [place] FDA and has been authorized for detection and/or diagnosis of SARS-CoV-2 by FDA under an Emergency Use Authorization (EUA).  This EUA will remain in effect (meaning this test can be used) for the duration of the COVID-19 declaration under Section 564(b)(1) of the Act, 21 U.S.C. section 360bbb-3(b)(1), unless the authorization is terminated or revoked sooner.  Performed at [facility], [place] [address] [place], [place] [address]      Radiological Exams on Admission: DG Chest Port 1 View  Result Date: 05/27/2020 CLINICAL DATA:  Chest pain and shortness of breath EXAM: PORTABLE CHEST 1 VIEW COMPARISON:  May 16, 2020 FINDINGS: The heart size and mediastinal contours are stable. The heart size is enlarged. Both lungs are clear. The visualized skeletal structures are unremarkable. IMPRESSION: No active cardiopulmonary disease. Electronically Signed   By: Abelardo Diesel M.D.   On: 05/27/2020 11:05    EKG: Independently reviewed.  Sinus tachycardia 126 bpm  Assessment/Plan Sepsis: Patient presented  with fever elevated up to 103 F, pulse 108-126, and respiration 20-25.  Chest x-ray was otherwise not clear and urinalysis did not show any clear signs of infection.  Patient previously noted to have a sacral ulcer but arrived from 6/23 did not show any signs of an abscess or osteomyelitis at that time.  Infectious disease had previously been consulted and had recommended cefepime as previous MRSA screens were negative. -Admit to a medical telemetry bed -Follow-up blood cultures -Continue empiric antibiotics of cefepime -Tylenol as needed for fever  Respiratory failure with hypoxia, COPD exacerbation: Acute on chronic.  Patient's venous pH was noted to be within normal limits at 7.351, PCO2 52, PO2 63.  Complaints of wheezing and shortness of breath.  It appears she has had several admissions with similar symptoms in the past. -Continuous pulse oximetry with nasal cannula oxygen maintaining O2 saturations -Solu-Medrol 125 mg IV x1 dose, then 60 mg IV every 8 hours -Levalbuterol and ipratropium nebs as needed -Continue budesonide nebs 1 mg twice daily  Chronic diastolic heart failure: Last EF noted to be 55% with grade 2 diastolic dysfunction.  BNP mildly elevated 183.3 but appears similar to previous.  She  appears to be slightly dehydrated or euvolemic at this time. -Strict intake and output -Daily weights   Hyponatremia: Acute.  Initial sodium was 132 on admission.  Suspect patient possibly dehydrated given elevated BUN to creatinine ratio. -Gentle IV fluids 75 mL/h x 1 L -Recheck sodium in a.m.  History of SVT: Patient appears to be in sinus tachycardia at this time.  Patient on verapamil 240 mg daily. -Continue verapamil  Hyperglycemia secondary to steroid use: Patient with mildly elevated at 136 on admission.  Patient was given IV steroids which I suspect we will elevate blood sugars during her hospital stay.  Last hemoglobin A1c was 5.9 on 04/29/2020. -Hypoglycemic protocol -CBGs before  every meal with sensitive SSI  History of kidney transplant on chronic immunosuppressive therapy -Continue immunosuppressive therapy  -Consider possibly checking a Prograf level at some time during her hospitalization question possibility of her having Prograf toxicity causing these intermittent fevers fevers and headache.  Trough thought to be of more benefit at this time as it has been over 12 hours since last dose.  Anemia of chronic disease: Hemoglobin 9.4 which appears to be near her baseline. -Continue to monitor  History of pulmonary hypertension   Sacral ulcer: Patient noted to have a stage II pressure ulcer of the sacrum.  MRI obtained from 6/23 did not show any signs of any abscess or osteomyelitis. -Low-air-loss mattress replacement  DVT prophylaxis: Lovenox Code Status: Full Family Communication: Daughter updated over the phone Disposition Plan: To be determined Consults called: None Admission status: Inpatient  Norval Morton MD [facility] Pager [phone]   If 7PM-7AM, please contact night-coverage [email or web address] Password [facility]  05/27/2020, 2:33 PM

## 2020-05-27 NOTE — ED Notes (Signed)
Unable to obtain 2nd IV access. IV team consult placed and attempt unsuccessfully as well.

## 2020-05-27 NOTE — ED Triage Notes (Signed)
Patient came in via [place] from [facility] [place] facility. Per EMS facility reported that pt's HR was elevated. EMS reports a HR of 120 when they arrived. Patient also reports a tight chest pain rated a 2/10. Patient is also SOB and usually not on O2, but breathing with retractions without O2. EMS placed pt on 3L in route. Pt in rehab since January for hip and wrist fx.  156/98 120 HR 24-28 RR SPO2 88% RA  SPO2 99% 3L [place] T- 97.9 CBG 137

## 2020-05-27 NOTE — ED Notes (Signed)
Stuck patient twice for repeat troponin. Successful attempt at the right cephalic vein.

## 2020-05-27 NOTE — ED Provider Notes (Addendum)
[facility] EMERGENCY DEPARTMENT Provider Note   CSN: [id number] Arrival date & time: 05/27/20  0940     History Chief Complaint  Patient presents with  . Shortness of Breath  . Chest Pain    April [name] is a 64 y.o. female   HPI  Is a 64 year old female with past medical history significant for polycystic kidney disease status post kidney implantation she is followed by [facility] nephrology.  She is no longer on dialysis after transplantation but she is on chronic prednisone.  She also has history of CHF, COPD, depression, HTN, HLD, asthma.   Patient is presented today for acute onset of shortness of breath began yesterday approximately 6 PM.  Patient states that she also endorses some chest pain that was sternal, nonradiating nonexertional that began at approximately 6 AM this morning.  She states that she has continued to have shortness of breath since yesterday.  She denies any cough or hemoptysis.  She states the chest pain is currently gone without intervention.  She says it is not exertional nor was it particularly pleuritic.  She denies any history of blood clots and states that she had extensive evaluation done during prior hospitalization with similar symptoms for blood clot which was negative.  She denies any nausea or vomiting.  She states she still does make urine which she has had no urinary symptoms.  Denies any back pain or focal weakness.  Denies headache or lightheadedness.  States that she feels generalized fatigue     Past Medical History:  Diagnosis Date  . Arthritis   . Asthma   . CHF (congestive heart failure) ([place])   . COPD (chronic obstructive pulmonary disease) ([place])   . Depression   . Essential hypertension 02/08/2020  . GERD (gastroesophageal reflux disease)   . Hyperlipidemia   . Hyperparathyroidism   . Polycystic kidney   . PONV (postoperative nausea and vomiting)     Patient Active Problem List   Diagnosis Date Noted  .  History of immunosuppressive therapy 05/27/2020  . Fever 05/15/2020  . Acute on chronic respiratory failure with hypoxia and hypercapnia (HCC)   . Immunocompromised state ([place])   . Decubitus ulcer of coccyx, unstageable ([place]) 05/01/2020  . Asthma 04/29/2020  . Sepsis due to gram-negative UTI ([place]) 04/16/2020  . Pericardial effusion 04/16/2020  . Chronic respiratory failure with hypoxia ([place]) 04/16/2020  . Proteinuria 04/16/2020  . Acute respiratory distress 04/16/2020  . Pressure injury of skin 04/08/2020  . COPD with acute exacerbation ([place]) 04/07/2020  . Acute on chronic respiratory failure with hypoxia ([place]) 03/14/2020  . PNA (pneumonia) 03/13/2020  . SVT (supraventricular tachycardia) ([place])   . Anemia of chronic disease   . Palliative care encounter   . Goals of care, counseling/discussion   . Essential hypertension 02/08/2020  . Glaucoma 02/08/2020  . Sepsis ([place]) 01/31/2020  . Hypoxia   . Shortness of breath   . Fracture of left superior pubic ramus (HCC)   . Pain   . Anxiety state   . Left displaced femoral neck fracture ([place]) 12/15/2019  . Status post total hip replacement, left   . Post-op pain   . Acute blood loss anemia   . Polycystic kidney   . Closed left hip fracture ([place]) 12/07/2019  . Left wrist fracture 12/07/2019  . Medication management 09/22/2019  . Physical deconditioning 09/22/2019  . Hyponatremia 06/29/2019  . Acute respiratory failure with hypoxia ([place]) 04/19/2019  . Acute on chronic diastolic  CHF (congestive heart failure) ([place]) 04/19/2019  . Bilateral closed proximal tibial fracture 12/21/2018  . Closed right ankle fracture 12/21/2018  . Fracture 12/21/2018  . Lower urinary tract infectious disease 10/03/2018  . Asthma, chronic, unspecified asthma severity, with acute exacerbation 10/03/2018  . SIRS (systemic inflammatory response syndrome) ([place]) 10/03/2018  . Nausea vomiting and diarrhea 10/02/2018  . Chronic diastolic CHF (congestive heart  failure) ([employer]) 10/01/2017  . Influenza B 10/01/2017  . Persistent asthma with status asthmaticus   . Pneumonia 07/15/2016  . Asthma, mild intermittent 07/15/2016  . GERD without esophagitis 07/15/2016  . Renal transplant recipient 07/15/2016  . Hyperlipidemia 07/15/2016  . Depression 07/15/2016  . Bronchitis, mucopurulent recurrent ([place]) 08/08/2014  . Chronic cough 07/24/2014  . End stage renal disease ([place]) 03/23/2012  . 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 BILATERAL OOPHORECTOMY    . BIOPSY  05/19/2020   Procedure: BIOPSY;  Surgeon: Carol Ada, MD;  Location: [facility] ENDOSCOPY;  Service: Endoscopy;;  . CARPAL TUNNEL RELEASE  2000  . CESAREAN SECTION    . COLONOSCOPY WITH PROPOFOL N/A 05/19/2020   Procedure: COLONOSCOPY WITH PROPOFOL;  Surgeon: Carol Ada, MD;  Location: [place];  Service: Endoscopy;  Laterality: N/A;  . ENTEROSCOPY N/A 05/19/2020   Procedure: ENTEROSCOPY;  Surgeon: Carol Ada, MD;  Location: [place];  Service: Endoscopy;  Laterality: N/A;  . INSERTION OF DIALYSIS CATHETER  03/31/2012   Procedure: INSERTION OF DIALYSIS CATHETER;  Surgeon: Angelia Mould, MD;  Location: [place];  Service: Vascular;  Laterality: N/A;  insertion of dialysis catheter right internal jugular vein  . 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 ORIF TIBIA FRACTURE Left 12/23/2018   Procedure: OPEN REDUCTION INTERNAL FIXATION (ORIF) TIBIA FRACTURE;  Surgeon: Shona Needles, MD;  Location: [place];  Service: Orthopedics;  Laterality: Left;  . ORIF TIBIA PLATEAU Right 12/23/2018   Procedure: OPEN REDUCTION INTERNAL FIXATION (ORIF) TIBIAL PLATEAU;  Surgeon: Shona Needles, MD;  Location: [place];  Service: Orthopedics;  Laterality: Right;  . ORIF WRIST FRACTURE Left 12/08/2019   Procedure: OPEN REDUCTION INTERNAL FIXATION (ORIF)  WRIST FRACTURE, REPAIR LACERATION LEFT FOREARM;  Surgeon: Dorna Leitz, MD;  Location: [facility];  Service: Orthopedics;  Laterality: Left;  . TONSILLECTOMY  1967  . TOTAL HIP ARTHROPLASTY Left 12/08/2019   Procedure: TOTAL HIP ARTHROPLASTY ANTERIOR APPROACH;  Surgeon: Dorna Leitz, MD;  Location: [facility];  Service: Orthopedics;  Laterality: Left;  . TUBAL LIGATION  2010     OB History   No obstetric history on file.     Family History  Problem Relation Age of Onset  . Hypertension Mother   . Heart disease Father        CABG history  . Polycystic kidney disease Father   . Polycystic kidney disease Brother     Social History   Tobacco Use  . Smoking status: Former Smoker    Packs/day: 0.25    Years: 15.00    Pack years: 3.75    Types: Cigarettes    Quit date: 03/22/1998    Years since quitting: 22.2  . Smokeless tobacco: Never Used  Vaping Use  . Vaping Use: Never used  Substance Use Topics  . Alcohol use: No    Comment: social  . Drug use: No    Home Medications  Prior to Admission medications   Medication Sig Start Date End Date Taking? Authorizing Provider  acetaminophen (TYLENOL) 325 MG tablet Take 2 tablets (650 mg total) by mouth every 6 (six) hours as needed for mild pain (or Fever >/= 101). 05/22/20  Yes Elgergawy, Silver Huguenin, MD  allopurinol (ZYLOPRIM) 300 MG tablet Take 1 tablet (300 mg total) by mouth daily. 01/04/20  Yes Angiulli, Lavon Paganini, PA-C  ascorbic acid (VITAMIN C) 500 MG tablet Take 500 mg by mouth daily.   Yes [provider]  aspirin 81 MG chewable tablet Chew 81 mg by mouth daily.   Yes [provider]  brimonidine (ALPHAGAN P) 0.1 % SOLN Place 1 drop into both eyes 2 (two) times daily.    Yes [provider]  budesonide (PULMICORT) 1 MG/2ML nebulizer solution Take 1 mg by nebulization in the morning and at bedtime.   Yes [provider]  cholecalciferol (VITAMIN D3) 25 MCG (1000 UNIT) tablet Take 1 tablet (1,000 Units  total) by mouth daily. 01/04/20  Yes Angiulli, Lavon Paganini, PA-C  collagenase (SANTYL) ointment Apply 1 application topically daily. Sacral wound   Yes [provider]  cyanocobalamin 1000 MCG tablet Take 1,000 mcg by mouth daily.   Yes [provider]  cycloSPORINE (RESTASIS) 0.05 % ophthalmic emulsion Place 1 drop into both eyes 2 (two) times daily. 01/04/20  Yes Angiulli, Lavon Paganini, PA-C  dextromethorphan-guaiFENesin (MUCINEX DM) 30-600 MG 12hr tablet Take 1 tablet by mouth 2 (two) times daily as needed for cough. 04/19/20  Yes Amin, Ankit Chirag, MD  dorzolamide (TRUSOPT) 2 % ophthalmic solution Place 1 drop into both eyes daily. 06/25/19  Yes [provider]  ferrous sulfate 325 (65 FE) MG tablet Take 325 mg by mouth daily with breakfast.   Yes [provider]  FLUoxetine (PROZAC) 40 MG capsule Take 1 capsule (40 mg total) by mouth daily. 01/04/20  Yes Angiulli, Lavon Paganini, PA-C  furosemide (LASIX) 40 MG tablet Take 1 tablet (40 mg total) by mouth daily. 02/07/20  Yes Sheikh, Omair Latif, DO  gabapentin (NEURONTIN) 100 MG capsule Take 200 mg by mouth 3 (three) times daily.   Yes [provider]  guaiFENesin (MUCINEX) 600 MG 12 hr tablet Take 1 tablet (600 mg total) by mouth 2 (two) times daily. 02/06/20  Yes Sheikh, Omair Latif, DO  insulin aspart (NOVOLOG) 100 UNIT/ML injection Inject 2-12 Units into the skin See admin instructions. Per sliding scale subcutaneous, once a day - If blood sugar is: 0 - 200: 0 units  201 - 250: 2 units  251 - 300: 4 units  301 - 350: 6 units  351 - 400: 8 units  401 - 450: 10 units  451 - 500: 12 units  500 or greater: Call MD and give 14 units, then recheck blood sugar in 2 hours, at that time if blood sugar is greater than 400 or less than 80, call PEC TRIAGE.   Yes [provider]  ipratropium (ATROVENT HFA) 17 MCG/ACT inhaler Inhale 1 puff into the lungs every 6 (six) hours. For COPD   Yes [provider]   isosorbide mononitrate (IMDUR) 60 MG 24 hr tablet Take 1 tablet (60 mg total) by mouth daily. 05/23/20  Yes Elgergawy, Silver Huguenin, MD  levalbuterol (XOPENEX) 0.63 MG/3ML nebulizer solution Take 0.63 mg by nebulization See admin instructions. Inhale 0.63mg  once daily.  May take 0.63mg  every six hours as needed COPD.   Yes [provider]  magnesium  oxide (MAG-OX) 400 MG tablet Take 1 tablet (400 mg total) by mouth 2 (two) times daily. 01/04/20  Yes Angiulli, Lavon Paganini, PA-C  Multiple Vitamin (MULTIVITAMIN WITH MINERALS) TABS tablet Take 1 tablet by mouth daily. 02/24/20  Yes Alma Friendly, MD  mycophenolate (MYFORTIC) 180 MG EC tablet Take 180 mg by mouth 2 (two) times daily.   Yes [provider]  pantoprazole (PROTONIX) 40 MG tablet Take 1 tablet (40 mg total) by mouth 2 (two) times daily. 01/04/20  Yes Angiulli, Lavon Paganini, PA-C  predniSONE (DELTASONE) 2.5 MG tablet Take 2.5 mg by mouth daily with breakfast. Takes with 5mg  tablet for total dose of 7.5 mg   Yes [provider]  predniSONE (DELTASONE) 5 MG tablet Take 1.5 tablets (7.5 mg total) by mouth daily with breakfast. Patient taking differently: Take 5 mg by mouth daily with breakfast. Take with the 2.5mg  tablet for a total dose of 7.5mg  05/25/20  Yes Elgergawy, Silver Huguenin, MD  PROGRAF 1 MG capsule Take 1 mg by mouth daily. 03/22/19  Yes [provider]  sennosides-docusate sodium (SENOKOT-S) 8.6-50 MG tablet Take 2 tablets by mouth daily.    Yes [provider]  tacrolimus (PROGRAF) 0.5 MG capsule Take 0.5 mg by mouth at bedtime. Takes brand name Prograf   Yes [provider]  TRAVATAN Z 0.004 % SOLN ophthalmic solution Place 1 drop into both eyes at bedtime. 07/03/16  Yes [provider]  valACYclovir (VALTREX) 500 MG tablet Take 500 mg by mouth 2 (two) times daily.   Yes [provider]  verapamil (CALAN-SR) 240 MG CR tablet Take 240 mg by mouth daily.   Yes [provider]  albuterol (VENTOLIN HFA) 108 (90 Base) MCG/ACT inhaler Inhale 2 puffs into the lungs every 4 (four) hours as needed for wheezing or shortness of breath. Patient not taking: Reported on 05/27/2020 01/04/20   Angiulli, Lavon Paganini, PA-C  budesonide (PULMICORT) 0.5 MG/2ML nebulizer solution Take 2 mLs (0.5 mg total) by nebulization 2 (two) times daily. Patient not taking: Reported on 05/27/2020 04/19/20   Damita Lack, MD  docusate sodium (COLACE) 100 MG capsule Take 1 capsule (100 mg total) by mouth 2 (two) times daily. Patient not taking: Reported on 05/27/2020 12/13/19   Shelly Coss, MD  feeding supplement, ENSURE ENLIVE, (ENSURE ENLIVE) LIQD Take 237 mLs by mouth 2 (two) times daily between meals. Patient not taking: Reported on 05/27/2020 02/23/20   Alma Friendly, MD  SYMBICORT 160-4.5 MCG/ACT inhaler Inhale 2 puffs into the lungs 2 (two) times daily. Patient not taking: Reported on 05/27/2020 01/04/20   Angiulli, Lavon Paganini, PA-C  verapamil (CALAN-SR) 180 MG CR tablet Take 1 tablet (180 mg total) by mouth daily. Patient not taking: Reported on 05/27/2020 05/23/20   Elgergawy, Silver Huguenin, MD    Allergies    Ardyth Harps Antonieta Pert dextran], Pentamidine, Erythromycin [erythromycin], Iohexol, Oxycodone, Erythromycin, and Ultram [tramadol hcl]  Review of Systems   Review of Systems  Constitutional: Positive for fatigue and fever. Negative for chills.  HENT: Negative for congestion.   Eyes: Negative for pain.  Respiratory: Positive for chest tightness and shortness of breath. Negative for cough.   Cardiovascular: Negative for leg swelling.  Gastrointestinal: Negative for abdominal pain and vomiting.  Genitourinary: Negative for dysuria.  Musculoskeletal: Negative for myalgias.  Skin: Negative for rash.  Neurological: Negative for dizziness and headaches.    Physical Exam Updated Vital Signs BP 136/63 (BP Location: Right Arm)   Pulse  93   Temp 97.7 F (36.5 C) (Oral)   Resp 18   Ht 5\' 1"  (1.549 m)    Wt 72 kg   SpO2 100%   BMI 29.99 kg/m   Physical Exam Vitals and nursing note reviewed.  Constitutional:      General: She is in acute distress.     Appearance: She is obese. She is ill-appearing.     Comments: Patient is ill-appearing 64 year old female.  Obvious increased work of breathing.  Able answer questions appropriately follow commands however.  HENT:     Head: Normocephalic and atraumatic.     Nose: Nose normal.     Mouth/Throat:     Mouth: Mucous membranes are dry.  Eyes:     General: No scleral icterus.    Extraocular Movements: Extraocular movements intact.     Pupils: Pupils are equal, round, and reactive to light.  Cardiovascular:     Rate and Rhythm: Regular rhythm. Tachycardia present.     Pulses: Normal pulses.     Heart sounds: Normal heart sounds.     Comments: Heart rate 130  Pulses symmetric bilaterally Lower/upper extremities Pulmonary:     Effort: No respiratory distress.     Breath sounds: Rales present. No wheezing.     Comments: Patient has significantly increased work of breathing.  Elevated respiratory rate approximately 30.  Bibasilar crackles. No wheezing. Abdominal:     Palpations: Abdomen is soft.     Tenderness: There is no abdominal tenderness. There is no right CVA tenderness, left CVA tenderness, guarding or rebound.  Genitourinary:    Comments: Large fungating masses hemorrhoids versus condyloma around anus Musculoskeletal:     Cervical back: Normal range of motion and neck supple. No tenderness.     Right lower leg: No edema.     Left lower leg: No edema.  Skin:    General: Skin is warm and dry.     Capillary Refill: Capillary refill takes less than 2 seconds.     Comments: Small superficial pressure ulcer to the sacrum that is without erythema, tenderness or discharge. Extremities with multiple skin tears.  No evidence of infection no erythema or tenderness.  There is bruising diffusely.  Neurological:     Mental Status: She is  alert and oriented to person, place, and time. Mental status is at baseline.     Comments: Alert and oriented to self, place, time and event.   Speech is fluent, clear without dysarthria or dysphasia.   Strength 5-/5 in upper/lower extremities  Sensation intact in upper/lower extremities   Normal finger-to-nose and feet tapping.  CN I not tested  CN II grossly intact visual fields bilaterally. Did not visualize posterior eye.   CN III, IV, VI PERRLA and EOMs intact bilaterally  CN V Intact sensation to sharp and light touch to the face  CN VII facial movements symmetric  CN VIII not tested  CN IX, X no uvula deviation, symmetric rise of soft palate  CN XI 5/5 SCM and trapezius strength bilaterally  CN XII Midline tongue protrusion, symmetric L/R movements   Psychiatric:        Mood and Affect: Mood normal.        Behavior: Behavior normal.     ED Results / Procedures / Treatments   Labs (all labs ordered are listed, but only abnormal results are displayed) Labs Reviewed  URINE CULTURE - Abnormal; Notable for the following components:      Result Value  Culture   (*)    Value: >=100,000 COLONIES/mL PROTEUS MIRABILIS SUSCEPTIBILITIES TO FOLLOW CULTURE REINCUBATED FOR BETTER GROWTH Performed at [facility], [place] [address] [place], [place] [address]    All other components within normal limits  COMPREHENSIVE METABOLIC PANEL - Abnormal; Notable for the following components:   Sodium 132 (*)    Chloride 97 (*)    Glucose, Bld 136 (*)    BUN 27 (*)    Creatinine, Ser 1.02 (*)    Albumin 2.7 (*)    Total Bilirubin 1.6 (*)    GFR calc non Af Amer 58 (*)    All other components within normal limits  CBC WITH DIFFERENTIAL/PLATELET - Abnormal; Notable for the following components:   WBC 17.1 (*)    RBC 3.01 (*)    Hemoglobin 9.4 (*)    HCT 31.3 (*)    MCV 104.0 (*)    RDW 22.0 (*)    Neutro Abs 14.9 (*)    Abs Immature Granulocytes 0.10 (*)    All other  components within normal limits  URINALYSIS, ROUTINE W REFLEX MICROSCOPIC - Abnormal; Notable for the following components:   Hgb urine dipstick SMALL (*)    Protein, ur 100 (*)    Bacteria, UA RARE (*)    All other components within normal limits  BRAIN NATRIURETIC PEPTIDE - Abnormal; Notable for the following components:   B Natriuretic Peptide 183.3 (*)    All other components within normal limits  CBC - Abnormal; Notable for the following components:   WBC 14.4 (*)    RBC 2.97 (*)    Hemoglobin 9.1 (*)    HCT 30.7 (*)    MCV 103.4 (*)    MCHC 29.6 (*)    RDW 20.8 (*)    All other components within normal limits  BASIC METABOLIC PANEL - Abnormal; Notable for the following components:   Glucose, Bld 188 (*)    BUN 32 (*)    All other components within normal limits  GLUCOSE, CAPILLARY - Abnormal; Notable for the following components:   Glucose-Capillary 164 (*)    All other components within normal limits  GLUCOSE, CAPILLARY - Abnormal; Notable for the following components:   Glucose-Capillary 130 (*)    All other components within normal limits  GLUCOSE, CAPILLARY - Abnormal; Notable for the following components:   Glucose-Capillary 197 (*)    All other components within normal limits  I-STAT VENOUS BLOOD GAS, ED - Abnormal; Notable for the following components:   pO2, Ven 63.0 (*)    Bicarbonate 28.8 (*)    Sodium 133 (*)    HCT 31.0 (*)    Hemoglobin 10.5 (*)    All other components within normal limits  CULTURE, BLOOD (ROUTINE X 2)  CULTURE, BLOOD (ROUTINE X 2)  SARS CORONAVIRUS 2 BY RT PCR (HOSPITAL ORDER, [facility])  MRSA PCR SCREENING  LACTIC ACID, PLASMA  APTT  PROTIME-INR  MAGNESIUM  TROPONIN I (HIGH SENSITIVITY)  TROPONIN I (HIGH SENSITIVITY)    EKG EKG Interpretation  Date/Time:  Monday May 27 2020 09:49:00 EDT Ventricular Rate:  126 PR Interval:    QRS Duration: 85 QT Interval:  297 QTC Calculation: 430 R  Axis:   76 Text Interpretation: Sinus tachycardia Probable left atrial enlargement Confirmed by Pattricia Boss [phone]) on 05/27/2020 10:18:51 AM Also confirmed by Pattricia Boss [phone]), editor Gean Quint [phone])  on 05/28/2020 9:50:45 AM   Radiology DG Chest  Port 1 View  Result Date: 05/27/2020 CLINICAL DATA:  Chest pain and shortness of breath EXAM: PORTABLE CHEST 1 VIEW COMPARISON:  May 16, 2020 FINDINGS: The heart size and mediastinal contours are stable. The heart size is enlarged. Both lungs are clear. The visualized skeletal structures are unremarkable. IMPRESSION: No active cardiopulmonary disease. Electronically Signed   By: Abelardo Diesel M.D.   On: 05/27/2020 11:05    Procedures .Critical Care Performed by: Tedd Sias, PA Authorized by: Tedd Sias, PA   Critical care provider statement:    Critical care time (minutes):  45   Critical care time was exclusive of:  Separately billable procedures and treating other patients and teaching time   Critical care was time spent personally by me on the following activities:  Discussions with consultants, evaluation of patient's response to treatment, examination of patient, review of old charts, re-evaluation of patient's condition, pulse oximetry, ordering and review of radiographic studies, ordering and review of laboratory studies and ordering and performing treatments and interventions   I assumed direction of critical care for this patient from another provider in my specialty: no     (including critical care time)  Medications Ordered in ED Medications  valACYclovir (VALTREX) tablet 500 mg (500 mg Oral Given 05/28/20 0836)  verapamil (CALAN-SR) CR tablet 240 mg (240 mg Oral Given 05/28/20 0833)  isosorbide mononitrate (IMDUR) 24 hr tablet 60 mg (60 mg Oral Given 05/28/20 0837)  FLUoxetine (PROZAC) capsule 40 mg (40 mg Oral Given 05/28/20 0837)  enoxaparin (LOVENOX) injection 40 mg (40 mg Subcutaneous Given 05/27/20 1723)  sodium  chloride flush (NS) 0.9 % injection 3 mL (3 mLs Intravenous Given 05/28/20 1158)  acetaminophen (TYLENOL) tablet 650 mg (has no administration in time range)    Or  acetaminophen (TYLENOL) suppository 650 mg (has no administration in time range)  ondansetron (ZOFRAN) tablet 4 mg (has no administration in time range)    Or  ondansetron (ZOFRAN) injection 4 mg (has no administration in time range)  ceFEPIme (MAXIPIME) 2 g in sodium chloride 0.9 % 100 mL IVPB (2 g Intravenous New Bag/Given 05/28/20 0831)  mycophenolate (MYFORTIC) EC tablet 180 mg (180 mg Oral Given 05/28/20 0837)  tacrolimus (PROGRAF) capsule 1 mg (1 mg Oral Given 05/28/20 0837)  tacrolimus (PROGRAF) capsule 0.5 mg (0.5 mg Oral Given 05/27/20 2248)  brimonidine (ALPHAGAN) 0.15 % ophthalmic solution 1 drop (1 drop Both Eyes Given 05/28/20 0828)  collagenase (SANTYL) ointment 1 application (1 application Topical Given 05/28/20 1159)  cycloSPORINE (RESTASIS) 0.05 % ophthalmic emulsion 1 drop (1 drop Both Eyes Given 05/28/20 0834)  dorzolamide (TRUSOPT) 2 % ophthalmic solution 1 drop (1 drop Both Eyes Given 05/28/20 0828)  latanoprost (XALATAN) 0.005 % ophthalmic solution 1 drop (1 drop Both Eyes Given 05/27/20 2301)  guaiFENesin (MUCINEX) 12 hr tablet 600 mg (600 mg Oral Given 05/28/20 0835)  gabapentin (NEURONTIN) capsule 200 mg (200 mg Oral Given 05/28/20 0835)  senna-docusate (Senokot-S) tablet 2 tablet (2 tablets Oral Given 05/28/20 0835)  magnesium oxide (MAG-OX) tablet 400 mg (400 mg Oral Given 05/28/20 0837)  pantoprazole (PROTONIX) EC tablet 40 mg (40 mg Oral Given 05/28/20 0835)  allopurinol (ZYLOPRIM) tablet 300 mg (300 mg Oral Given 05/28/20 0837)  aspirin chewable tablet 81 mg (81 mg Oral Given 05/28/20 0835)  insulin aspart (novoLOG) injection 0-9 Units (2 Units Subcutaneous Given 05/28/20 1158)  insulin aspart (novoLOG) injection 0-5 Units (0 Units Subcutaneous Not Given 05/27/20 2305)  ferrous sulfate tablet 325 mg (  325 mg Oral Given 05/28/20 0837)   vitamin B-12 (CYANOCOBALAMIN) tablet 1,000 mcg (1,000 mcg Oral Given 05/28/20 0836)  multivitamin with minerals tablet 1 tablet (1 tablet Oral Given 05/28/20 0835)  promethazine (PHENERGAN) injection 25 mg (25 mg Intravenous Given 05/27/20 1811)  ipratropium (ATROVENT) nebulizer solution 0.5 mg (has no administration in time range)  budesonide (PULMICORT) nebulizer solution 1 mg (1 mg Nebulization Not Given 05/28/20 0851)  levalbuterol (XOPENEX) nebulizer solution 0.63 mg (0.63 mg Nebulization Given 05/28/20 0851)  predniSONE (DELTASONE) tablet 40 mg (has no administration in time range)  acetaminophen (TYLENOL) tablet 1,000 mg (1,000 mg Oral Given 05/27/20 1101)  lactated ringers bolus 1,000 mL (0 mLs Intravenous Stopped 05/27/20 1523)  ceFEPIme (MAXIPIME) 2 g in sodium chloride 0.9 % 100 mL IVPB (0 g Intravenous Stopped 05/27/20 1305)  vancomycin (VANCOREADY) IVPB 1500 mg/300 mL (0 mg Intravenous Stopped 05/27/20 1523)  lactated ringers bolus 1,000 mL (1,000 mLs Intravenous New Bag/Given 05/27/20 1534)  methylPREDNISolone sodium succinate (SOLU-MEDROL) 125 mg/2 mL injection 125 mg (125 mg Intravenous Given 05/27/20 1741)  0.9 %  sodium chloride infusion (0 mLs Intravenous Stopped 05/28/20 1154)  HYDROmorphone (DILAUDID) injection 0.5 mg (0.5 mg Intravenous Given 05/27/20 1811)    ED Course  I have reviewed the triage vital signs and the nursing notes.  Pertinent labs & imaging results that were available during my care of the patient were reviewed by me and considered in my medical decision making (see chart for details).  Clinical Course as of May 28 1334  Mon May 27, 2020  1024 Discussed with ED pharmacist who recommends thank cefepime.  These will be initiated within the 1 hour mark.   [WF]    Clinical Course User Index [WF] Tedd Sias, [place]   MDM Rules/Calculators/A&P                          Patient is 64 year old female with past medical history detailed above presented today for chest pain  shortness of breath fever.  She was noted to be febrile temperature 103 pulse of 130 on arrival to the emergency department and respiratory rate of approximately 25.  Blood pressure is stable within normal limits.  Physical exam was notable for crackles although she does not appear fluid overloaded suspect pneumonia for increased respiratory rate and fever.  I reviewed EMR it appears the patient has presented to emergency department several times for similar symptoms and has not been found to have any specific source although extensive work-up has been completed in the past.  She is on chronic immunosuppressive therapy in the form of prednisone.  CBC notable for significant leukocytosis presenting 0.1 and anemia of 9.4.  She is mildly hyponatremic and has reassuring kidney function labs.  BNP is mildly elevated at 183 and troponin IX.  Lactic within normal limits.  Patient received sepsis treatment in the form of 2 L of crystalloid, broad-spectrum empiric antibiotics with pharmacy consultation.  Patient was not given a full 30 mg/kg bolus of fluid because of her increased respiratory rate and crackles I had some concern for fluid overload as she does have a history of CHF.  Her venous blood gas was actually with an elevated oxygen I have low suspicion for VQ mismatch as patient is with high oxygen levels and had thorough work-up with VQ scan and negative bilateral lower extremity Doppler ultrasounds done during her last hospital stay.  Initially resolved no acute abnormality.  EKGs  without any evidence of acute ischemia.  Urinalysis without any evidence of acute infection however cultures are pending.  She is covered empirically with broad-spectrum antibiotics this time.  Suspect patient will benefit from admission and follow-up on blood cultures and further evaluation by infectious disease.  I discussed this case with my attending physician who cosigned this note including patient's presenting symptoms,  physical exam, and planned diagnostics and interventions. Attending physician stated agreement with plan or made changes to plan which were implemented.   Attending physician assessed patient at bedside.  2:33 PM discussed case with Dr. Tamala Julian who will assume care of patient at this time for admission.  Patient will probably benefit from follow-up on culture results, broad-spectrum IV antibiotics in the interim and antipyretics and continued fluid therapy.   I reassessed patient after admission.  Her pulse rate has improved to approximately 100.  Blood pressure remains within normal limits.  Her fever has resolved to 100.8 rectally this was done by myself and nursing staff.  She is understanding of plan to admit at this time.  She is agreeable to disposition.  Final Clinical Impression(s) / ED Diagnoses Final diagnoses:  Sepsis, due to unspecified organism, unspecified whether acute organ dysfunction present [facility])    Rx / DC Orders ED Discharge Orders    None       Tedd Sias, [place] 05/28/20 1335    Pati Gallo [place], [place] 05/28/20 1338    Pattricia Boss, MD 06/03/20 1528

## 2020-05-27 NOTE — ED Notes (Signed)
Provider at bedside

## 2020-05-27 NOTE — ED Notes (Signed)
Pt refusing all PO at the moment. Pt feels she is unable to swallow any pills at this time. MD aware

## 2020-05-27 NOTE — ED Notes (Signed)
MD at bedside. 

## 2020-05-27 NOTE — Progress Notes (Signed)
Pharmacy Antibiotic Note  April [name] is a 64 y.o. female admitted on 05/27/2020 with worsening shortness of breath.  Pharmacy has been consulted for Cefepime dosing for infection of unknown source. WBC 171. SCr 1.02. LA wnl.   Plan: -Cefepime 2 gm IV Q 12 hours -Monitor CBC, renal fx, cultures and clinical progress  Height: 5\' 1"  (154.9 cm) Weight: 72 kg (158 lb 11.7 oz) IBW/kg (Calculated) : 47.8  Temp (24hrs), Avg:100.7 F (38.2 C), Min:98.3 F (36.8 C), Max:103 F (39.4 C)  Recent Labs  Lab 05/27/20 1020  WBC 17.1*  CREATININE 1.02*  LATICACIDVEN 1.1    Estimated Creatinine Clearance: 51.2 mL/min (A) (by C-G formula based on SCr of 1.02 mg/dL (H)).    Allergies  Allergen Reactions  . Infed [Iron Dextran] Other (See Comments)    Chest tightness  . Pentamidine Itching, Shortness Of Breath and Swelling  . Erythromycin [Erythromycin] Other (See Comments)    Mouth Ulcers  . Iohexol Other (See Comments)    Per patient "she has had a kidney transplant and should never have contrast"  . Oxycodone Nausea Only and Nausea And Vomiting  . Erythromycin Rash    Causes breakout in mouth  . Ultram [Tramadol Hcl] Anxiety    Antimicrobials this admission: Cefepime 7/5 >>   Dose adjustments this admission:  Microbiology results: 7/5 BCx:  7/5 UCx:     Thank you for allowing pharmacy to be a part of this patient's care.  Albertina Parr, PharmD., BCPS, BCCCP Clinical Pharmacist Clinical phone for 05/27/20 until 11:30pm: [phone] If after 11:30pm, please refer to [facility] for unit-specific pharmacist

## 2020-05-28 LAB — CBC
HCT: 30.7 % — ABNORMAL LOW (ref 36.0–46.0)
Hemoglobin: 9.1 g/dL — ABNORMAL LOW (ref 12.0–15.0)
MCH: 30.6 pg (ref 26.0–34.0)
MCHC: 29.6 g/dL — ABNORMAL LOW (ref 30.0–36.0)
MCV: 103.4 fL — ABNORMAL HIGH (ref 80.0–100.0)
Platelets: 162 10*3/uL (ref 150–400)
RBC: 2.97 MIL/uL — ABNORMAL LOW (ref 3.87–5.11)
RDW: 20.8 % — ABNORMAL HIGH (ref 11.5–15.5)
WBC: 14.4 10*3/uL — ABNORMAL HIGH (ref 4.0–10.5)
nRBC: 0 % (ref 0.0–0.2)

## 2020-05-28 LAB — GLUCOSE, CAPILLARY
Glucose-Capillary: 130 mg/dL — ABNORMAL HIGH (ref 70–99)
Glucose-Capillary: 164 mg/dL — ABNORMAL HIGH (ref 70–99)
Glucose-Capillary: 197 mg/dL — ABNORMAL HIGH (ref 70–99)
Glucose-Capillary: 203 mg/dL — ABNORMAL HIGH (ref 70–99)
Glucose-Capillary: 165 mg/dL — ABNORMAL HIGH (ref 70–99)

## 2020-05-28 LAB — BASIC METABOLIC PANEL
Anion gap: 11 (ref 5–15)
BUN: 32 mg/dL — ABNORMAL HIGH (ref 8–23)
CO2: 22 mmol/L (ref 22–32)
Calcium: 9.4 mg/dL (ref 8.9–10.3)
Chloride: 104 mmol/L (ref 98–111)
Creatinine, Ser: 0.93 mg/dL (ref 0.44–1.00)
GFR calc Af Amer: >60 mL/min (ref >60)
GFR calc non Af Amer: >60 mL/min (ref >60)
Glucose, Bld: 188 mg/dL — ABNORMAL HIGH (ref 70–99)
Potassium: 4.7 mmol/L (ref 3.5–5.1)
Sodium: 137 mmol/L (ref 135–145)

## 2020-05-28 LAB — MAGNESIUM: Magnesium: 2.2 mg/dL (ref 1.7–2.4)

## 2020-05-28 MED ORDER — LEVALBUTEROL HCL 0.63 MG/3ML IN NEBU
0.6300 mg | INHALATION_SOLUTION | Freq: Three times a day (TID) | RESPIRATORY_TRACT | Status: DC
  Administered 2020-05-28 – 2020-05-29 (×4): 0.63 mg via RESPIRATORY_TRACT
  Filled 2020-05-28 (×4): qty 3

## 2020-05-28 MED ORDER — PREDNISONE 20 MG PO TABS
40.0000 mg | ORAL_TABLET | Freq: Every day | ORAL | Status: DC
  Administered 2020-05-29: 40 mg via ORAL
  Filled 2020-05-28: qty 2

## 2020-05-28 NOTE — Plan of Care (Signed)
°  Problem: Respiratory: °Goal: Ability to maintain adequate ventilation will improve °Outcome: Progressing °  °

## 2020-05-28 NOTE — Plan of Care (Signed)
  Problem: Health Behavior/Discharge Planning: Goal: Ability to manage health-related needs will improve Outcome: Progressing   

## 2020-05-28 NOTE — Evaluation (Signed)
Physical Therapy Evaluation Patient Details Name: April [name] MRN: [id number] DOB: 08/29/1956 Today's Date: 05/28/2020   History of Present Illness  April [name] is a 64 y.o. female with medical history significant of polycystic kidney disease s/p renal transplant on chronic immunosuppressive therapy, COPD, chronic respiratory failure with hypoxia on 2 L of nasal cannula oxygen at baseline, diastolic CHF, and pulmonary hypertension.  She presents with complaints of progressively worsening shortness of breath over the last 2 days.  She just recently been hospitalized from 6/21-6/30 with sepsis of origin.  Clinical Impression  Patient received in bed, sleeping. Lunch tray in room, untouched. Agrees to PT. Performed supine to sit and rolling with min guard. Heavy use of bed rail. Patient soaking wet from sweat in bed. Performed sit to stand transfer with min guard. Took a few steps from bed to recliner. Min guard and RW. Patient hesitant to sit in recliner, but did well. She will continue to benefit from skilled PT to improve strength and functional independence to reduce fall risk.      Follow Up Recommendations SNF;Supervision/Assistance - 24 hour    Equipment Recommendations  None recommended by PT    Recommendations for Other Services       Precautions / Restrictions Precautions Precautions: Fall Precaution Comments: 2L O2 Restrictions Weight Bearing Restrictions: No      Mobility  Bed Mobility Overal bed mobility: Modified Independent Bed Mobility: Supine to Sit Rolling: Min guard Sidelying to sit: Min guard Supine to sit: Min guard     General bed mobility comments: use of bed rails  Transfers Overall transfer level: Needs assistance Equipment used: Rolling walker (2 wheeled) Transfers: Sit to/from Stand Sit to Stand: Min assist            Ambulation/Gait Ambulation/Gait assistance: Min guard Gait Distance (Feet): 4 Feet Assistive device: Rolling  walker (2 wheeled) Gait Pattern/deviations: Step-to pattern;Shuffle;Decreased stride length Gait velocity: decreased   General Gait Details: Able to take turning steps from bed to recliner.  Stairs            Wheelchair Mobility    Modified Rankin (Stroke Patients Only)       Balance Overall balance assessment: Needs assistance Sitting-balance support: Feet supported Sitting balance-Leahy Scale: Good Sitting balance - Comments: supervision only   Standing balance support: Bilateral upper extremity supported;During functional activity Standing balance-Leahy Scale: Fair Standing balance comment: reliant on external support                             Pertinent Vitals/Pain Pain Assessment: Faces Faces Pain Scale: Hurts a little bit Pain Location: sacral area Pain Descriptors / Indicators: Sore;Discomfort Pain Intervention(s): Monitored during session    Home Living Family/patient expects to be discharged to:: Skilled nursing facility Living Arrangements: Spouse/significant other;Children               Additional Comments: At home she lives with family, daughter is an OT, has Press photographer    Prior Function Level of Independence: Needs assistance   Gait / Transfers Assistance Needed: pt working with PT at SNF, was able to ambulate ~30' with RW with assist. 1 fall at [place] / [place] Needed: Able to do UB dressing, assist needed with LB dressing.  Comments: Pt at SNF for rehab due to recent hospital admissions.  Per prior notes was independent with RW prior to recent admission     Hand Dominance  Dominant Hand: Right    Extremity/Trunk Assessment   Upper Extremity Assessment Upper Extremity Assessment: Generalized weakness    Lower Extremity Assessment Lower Extremity Assessment: Generalized weakness RLE Sensation: decreased light touch LLE Sensation: decreased light touch    Cervical / Trunk  Assessment Cervical / Trunk Assessment: Normal  Communication   Communication: No difficulties  Cognition Arousal/Alertness: Awake/alert Behavior During Therapy: WFL for tasks assessed/performed Overall Cognitive Status: Within Functional Limits for tasks assessed                                 General Comments: Patient fearful and hesitant with mobility. Asks a lot of questions      General Comments      Exercises     Assessment/Plan    PT Assessment Patient needs continued PT services  PT Problem List Decreased strength;Decreased activity tolerance;Decreased balance;Decreased mobility;Decreased cognition;Decreased knowledge of use of DME;Decreased knowledge of precautions;Cardiopulmonary status limiting activity;Pain;Impaired sensation       PT Treatment Interventions DME instruction;Gait training;Functional mobility training;Therapeutic activities;Therapeutic exercise;Balance training;Neuromuscular re-education;Cognitive remediation;Patient/family education    PT Goals (Current goals can be found in the Care Plan section)  Acute Rehab PT Goals Patient Stated Goal: return to SNF, "dont want to be a burden on my family" PT Goal Formulation: With patient Time For Goal Achievement: 06/11/20 Potential to Achieve Goals: Fair    Frequency Min 3X/week   Barriers to discharge        Co-evaluation               AM-PAC PT "6 Clicks" Mobility  Outcome Measure Help needed turning from your back to your side while in a flat bed without using bedrails?: A Little Help needed moving from lying on your back to sitting on the side of a flat bed without using bedrails?: A Little Help needed moving to and from a bed to a chair (including a wheelchair)?: A Little Help needed standing up from a chair using your arms (e.g., wheelchair or bedside chair)?: A Little Help needed to walk in hospital room?: A Lot Help needed climbing 3-5 steps with a railing? : Total 6  Click Score: 15    End of Session Equipment Utilized During Treatment: Oxygen Activity Tolerance: Patient tolerated treatment well Patient left: in chair;with chair alarm set;with call bell/phone within reach Nurse Communication: Mobility status;Other (comment) (patient has been sweating a lot and leads have come off as well as O2 saturation monitor.) PT Visit Diagnosis: Unsteadiness on feet (R26.81);Muscle weakness (generalized) (M62.81);Other abnormalities of gait and mobility (R26.89);History of falling (Z91.81);Difficulty in walking, not elsewhere classified (R26.2)    Time: 1335-1400 PT Time Calculation (min) (ACUTE ONLY): 25 min   Charges:   PT Evaluation $PT Eval Moderate Complexity: 1 Mod PT Treatments $Gait Training: 8-22 mins        Skip Litke, PT, GCS 05/28/20,2:22 PM

## 2020-05-28 NOTE — Evaluation (Addendum)
Occupational Therapy Evaluation Patient Details Name: April [name] MRN: [id number] DOB: [birth year] Today's Date: 05/28/2020    History of Present Illness April [name] is a 64 y.o. female with medical history significant of polycystic kidney disease s/p renal transplant on chronic immunosuppressive therapy, COPD, chronic respiratory failure with hypoxia on 2 L of nasal cannula oxygen at baseline, diastolic CHF, and pulmonary hypertension.  She presents with complaints of progressively worsening shortness of breath over the last 2 days.  She just recently been hospitalized from 6/21-6/30 with sepsis of origin.   Clinical Impression   This 64 yo female admitted with above presents to acute OT with being at SNF pta due to a recent hospitalization from 6/21-6/30. Currently she is min A sit<>stand and setup/S-Min A for basic ADLs. She will benefit from acute OT with follow up back at SNF to work towards going home.    Follow Up Recommendations  SNF;Supervision/Assistance - 24 hour    Equipment Recommendations  Other (comment) (TBD at next venue)       Precautions / Restrictions Precautions Precautions: Fall Precaution Comments: 2L O2 Restrictions Weight Bearing Restrictions: No      Mobility Bed Mobility   General bed mobility comments: pt up on recliner upon arrivl  Transfers Overall transfer level: Needs assistance Equipment used: Rolling walker (2 wheeled) Transfers: Sit to/from Stand Sit to Stand: Min assist              Balance Overall balance assessment: Needs assistance Sitting-balance support: No upper extremity supported;Feet supported Sitting balance-Leahy Scale: Good Sitting balance - Comments: supervision only   Standing balance support: Bilateral upper extremity supported;During functional activity Standing balance-Leahy Scale: Poor Standing balance comment: reliant on RW                           ADL either performed or assessed with  clinical judgement   ADL Overall ADL's : Needs assistance/impaired Eating/Feeding: Independent;Sitting   Grooming: Set up;Sitting   Upper Body Bathing: Set up;Sitting   Lower Body Bathing: Minimal assistance;Sit to/from stand, can cross her legs while seated to get other feet   Upper Body Dressing : Set up;Sitting   Lower Body Dressing: Minimal assistance;Sit to/from stand, can cross her legs while seated to get to her feet   Toilet Transfer: Minimal assistance;RW   Toileting- Clothing Manipulation and Hygiene: Moderate assistance Toileting - Clothing Manipulation Details (indicate cue type and reason): min A sit<>stand        Educated pt on pressure relief (she did this laterally to one side while in chair), made her aware she needed to do pressure relief to both sides and/or forward by putting the footrest on recliner down. She was able to put footrest down on her own, but not put it back up on her own.     Vision Baseline Vision/History: Wears glasses Patient Visual Report: No change from baseline              Pertinent Vitals/Pain Pain Assessment: Faces Faces Pain Scale: Hurts little more Pain Location: sacral area Pain Descriptors / Indicators: Sore;Discomfort Pain Intervention(s): Monitored during session;Repositioned (placed  geo mat under patient)     Hand Dominance Right   Extremity/Trunk Assessment Upper Extremity Assessment Upper Extremity Assessment: Generalized weakness     Communication Communication Communication: No difficulties   Cognition Arousal/Alertness: Awake/alert Behavior During Therapy: WFL for tasks assessed/performed Overall Cognitive Status: Within Functional Limits for tasks assessed  Home Living Family/patient expects to be discharged to:: Private residence Living Arrangements: Spouse/significant other;Children Available Help at Discharge: Family;Available  PRN/intermittently Type of Home: House Home Access: Ramped entrance     Home Layout: Two level;Able to live on main level with bedroom/bathroom Alternate Level Stairs-Number of Steps: 12   Bathroom Shower/Tub: Walk-in shower         Home Equipment: Environmental consultant - 2 wheels;Wheelchair - [employer];Shower seat;Adaptive equipment;Hospital bed Adaptive Equipment: Reacher;Sock aid Additional Comments: At home she lives with family, daughter is an OT, has Press photographer      Prior Functioning/Environment Level of Independence: Needs assistance  Gait / Transfers Assistance Needed: pt working with PT at SNF, was able to ambulate ~30' with RW with assist. 1 fall at [place] / [place] Needed: Able to do UB dressing, assist needed with LB dressing.   Comments: Pt at SNF for rehab due to recent hospital admissions.  Per prior notes was independent with RW prior to recent admission        OT Problem List: Decreased strength;Impaired balance (sitting and/or standing);Pain      OT Treatment/Interventions: Self-care/ADL training;DME and/or AE instruction;Patient/family education;Balance training    OT Goals(Current goals can be found in the care plan section) Acute Rehab OT Goals Patient Stated Goal: would like to go home, but may not be strong enough to do that yet OT Goal Formulation: With patient Time For Goal Achievement: 06/11/20 Potential to Achieve Goals: Good  OT Frequency: Min 2X/week              AM-PAC OT "6 Clicks" Daily Activity     Outcome Measure Help from another person eating meals?: None Help from another person taking care of personal grooming?: A Little Help from another person toileting, which includes using toliet, bedpan, or urinal?: A Lot Help from another person bathing (including washing, rinsing, drying)?: A Little Help from another person to put on and taking off regular upper body clothing?: A Little Help from another person to  put on and taking off regular lower body clothing?: A Little 6 Click Score: 18   End of Session Equipment Utilized During Treatment: Oxygen (2 liters (99%))  Activity Tolerance: Patient tolerated treatment well Patient left: in chair;with call bell/phone within reach;with chair alarm set  OT Visit Diagnosis: Muscle weakness (generalized) (M62.81);History of falling (Z91.81);Unsteadiness on feet (R26.81)                Time: 0092-3300 OT Time Calculation (min): 36 min Charges:  OT General Charges $OT Visit: 1 Visit OT Treatments $Self Care/Home Management : 8-22 mins  [address], OTR/L Acute [employer] Pager [phone] Office [phone]      Almon Register 05/28/2020, 4:44 PM

## 2020-05-28 NOTE — Progress Notes (Signed)
PROGRESS NOTE    April [name]  UMP:[id number] DOB: Nov 11, 1956 DOA: 05/27/2020 PCP: [name], MD   Brief Narrative:  Patient is a 64 year old female with history of polycystic disease status post transplant on chronic chemo immunosuppression therapy, COPD, chronic respiratory failure with hypoxia on 2 L of oxygen per minute at baseline, diastolic CHF, pulmonary hypertension who presents with shortness of breath.  She was just discharged from ER after being hospitalized from 6/21-6/30 for the management of sepsis.  She was found to have wheezing some chest discomfort, she denied cough.  On presentation she was febrile, tachycardic, tachypneic, had leukocytosis.  Chest x-ray did not show any acute abnormalities.   Blood cultures were obtained.  She was started on antibiotics, IV fluids.  Urine culture growing Proteus  Assessment & Plan:   Active Problems:   Renal transplant recipient   Chronic diastolic CHF (congestive heart failure) (HCC)   Hyponatremia   Sepsis (HCC)   Anemia of chronic disease   Acute on chronic respiratory failure with hypoxia (HCC)   COPD with acute exacerbation ([place])   History of immunosuppressive therapy   Sepsis: Presented with fever, tachycardia, tachypnea.  Chest x-ray did not show any pneumonia.  UTI growing Proteus  We will follow-up final culture and sensitivity.   Patient has a chronic sacral ulcer.  Extensive evaluation was done during last admission and there was no signs of abscess or osteomyelitis.  ID was consulted at that time and recommended to stop Abx.Continue cefepime for now.  Tylenol for as needed fever.  Currently she is afebrile.  We will follow-up repeat blood  cultures. She has mild leukocytosis but it is improving.  Acute on chronic respiratory failure with hypoxia: Secondary to COPD exacerbation.  Presented with wheezing, shortness of breath.  Started on Solu-Medrol.  Continue bronchodilators.  She does not have any wheezes today.   Steroids changed to oral.  Chronic diastolic congestive heart failure: Her last echocardiogram showed ejection fraction of 40%, grade 2 diastolic dysfunction.  Currently she is near euvolemic status.  Hyponatremia: Suspected to be mildly dehydrated on presentation.  Sodium of 132 on presentation.  She was started on gentle IV fluids.Na is 137 today  History of SVT: Was in sinus tachycardia on presentation.  Continue verapamil 240 mg daily.  Hyperglycemia: Could be associated with the steroid use.  Last hemoglobin A1c of 5.9 as per 04/29/2020.  Monitor CBGs.  History of kidney transplant: On chronic immunosuppressive therapy.  Continue current medications.  Anemia of chronic disease: Currently H&H stable.  History with CKD  History of pulmonary hypertension:: On supplemental oxygen at baseline.  Sacral ulcer: Has a stage II pressure ulcer on the sacrum.  Present on admission.  MRI done on 6/1 3 did not show any signs of abscess or osteomyelitis.  Frequent turning. Ulcer does not look infected.  Debility/deconditioning: Was discharged to rehab( [facility]) on her last admission.  Has significant debility/deconditioning.  Will request a PT/OT evaluation          DVT prophylaxis: Lovenox Code Status: Full Family Communication: called and discussed with daughter on phone on 05/28/20 Status is: Inpatient  Remains inpatient appropriate because:IV treatments appropriate due to intensity of illness or inability to take PO   Dispo: The patient is from: SNF              Anticipated d/c is to: SNF              Anticipated d/c date is: 1-2  days              Patient currently is not medically stable to d/c. Waiting for urine culture report.  Consultants: None  Procedures:None  Antimicrobials:  Anti-infectives (From admission, onward)   Start     Dose/Rate Route Frequency Ordered Stop   05/27/20 2200  ceFEPIme (MAXIPIME) 2 g in sodium chloride 0.9 % 100 mL IVPB  Status:  Discontinued         2 g 200 mL/hr over 30 Minutes Intravenous  Once 05/27/20 1530 05/27/20 1542   05/27/20 2200  ceFEPIme (MAXIPIME) 2 g in sodium chloride 0.9 % 100 mL IVPB     Discontinue     2 g 200 mL/hr over 30 Minutes Intravenous Every 12 hours 05/27/20 1542     05/27/20 1600  valACYclovir (VALTREX) tablet 500 mg     Discontinue     500 mg Oral 2 times daily 05/27/20 1528     05/27/20 1030  ceFEPIme (MAXIPIME) 2 g in sodium chloride 0.9 % 100 mL IVPB        2 g 200 mL/hr over 30 Minutes Intravenous  Once 05/27/20 1025 05/27/20 1305   05/27/20 1030  vancomycin (VANCOREADY) IVPB 1500 mg/300 mL        1,500 mg 150 mL/hr over 120 Minutes Intravenous  Once 05/27/20 1025 05/27/20 1523   05/27/20 1015  cefTRIAXone (ROCEPHIN) 1 g in sodium chloride 0.9 % 100 mL IVPB  Status:  Discontinued        1 g 200 mL/hr over 30 Minutes Intravenous  Once 05/27/20 1012 05/27/20 1024   05/27/20 1015  azithromycin (ZITHROMAX) 500 mg in sodium chloride 0.9 % 250 mL IVPB  Status:  Discontinued        500 mg 250 mL/hr over 60 Minutes Intravenous  Once 05/27/20 1012 05/27/20 1024      Subjective: Patient seen and examined at the bedside this morning.  Looks significantly weak, but feels more comfortable today.  Alert and oriented.  Lying in bed.  Respiratory status is better.  Denies any cough or shortness of breath.  Objective: Vitals:   05/27/20 2257 05/28/20 0208 05/28/20 0252 05/28/20 0648  BP: 134/75  (!) 114/59 (!) 117/58  Pulse: 100  85 85  Resp: (!) 22  16 18   Temp: 98.1 F (36.7 C)  (!) 97.5 F (36.4 C) (!) 97.5 F (36.4 C)  TempSrc: Oral  Oral Oral  SpO2: 100% 100% 100% 100%  Weight:      Height:        Intake/Output Summary (Last 24 hours) at 05/28/2020 0816 Last data filed at 05/27/2020 2238 Gross per 24 hour  Intake 1320 ml  Output --  Net 1320 ml   Filed Weights   05/27/20 1013  Weight: 72 kg    Examination:  General exam: Chronically Ill looking, generalized weakness, obese   HEENT:PERRL,Oral mucosa moist, Ear/Nose normal on gross exam Respiratory system: Bilateral diminished breath sounds but no wheezes or crackles  cardiovascular system: S1 & S2 heard, RRR. No JVD, murmurs, rubs, gallops or clicks. No pedal edema. Gastrointestinal system: Abdomen is nondistended, soft and nontender. No organomegaly or masses felt. Normal bowel sounds heard. Central nervous system: Alert and oriented. No focal neurological deficits. Extremities: No edema, no clubbing ,no cyanosis, ecchymosis on the bilateral upper extremities Skin: Numerous skin breakdowns, scattered bruises, stage II sacral ulcer present on admission    Data Reviewed: I have personally reviewed following labs and imaging  studies  CBC: Recent Labs  Lab 05/27/20 1020 05/27/20 1045 05/28/20 0548  WBC 17.1*  --  14.4*  NEUTROABS 14.9*  --   --   HGB 9.4* 10.5* 9.1*  HCT 31.3* 31.0* 30.7*  MCV 104.0*  --  103.4*  PLT 233  --  517   Basic Metabolic Panel: Recent Labs  Lab 05/22/20 0509 05/27/20 1020 05/27/20 1045 05/28/20 0548  NA  --  132* 133* 137  K 4.0 4.1 4.2 4.7  CL  --  97*  --  104  CO2  --  26  --  22  GLUCOSE  --  136*  --  188*  BUN  --  27*  --  32*  CREATININE  --  1.02*  --  0.93  CALCIUM  --  9.2  --  9.4  MG 2.2  --   --  2.2   GFR: Estimated Creatinine Clearance: 56.2 mL/min (by C-G formula based on SCr of 0.93 mg/dL). Liver Function Tests: Recent Labs  Lab 05/27/20 1020  AST 20  ALT 17  ALKPHOS 78  BILITOT 1.6*  PROT 7.2  ALBUMIN 2.7*   No results for input(s): LIPASE, AMYLASE in the last 168 hours. No results for input(s): AMMONIA in the last 168 hours. Coagulation Profile: Recent Labs  Lab 05/27/20 1020  INR 1.2   Cardiac Enzymes: No results for input(s): CKTOTAL, CKMB, CKMBINDEX, TROPONINI in the last 168 hours. BNP (last 3 results) No results for input(s): PROBNP in the last 8760 hours. HbA1C: No results for input(s): HGBA1C in the last 72  hours. CBG: Recent Labs  Lab 05/21/20 2111 05/22/20 0750 05/22/20 1223 05/27/20 2221 05/28/20 0646  GLUCAP 238* 72 155* 130* 164*   Lipid Profile: No results for input(s): CHOL, HDL, LDLCALC, TRIG, CHOLHDL, LDLDIRECT in the last 72 hours. Thyroid Function Tests: No results for input(s): TSH, T4TOTAL, FREET4, T3FREE, THYROIDAB in the last 72 hours. Anemia Panel: No results for input(s): VITAMINB12, FOLATE, FERRITIN, TIBC, IRON, RETICCTPCT in the last 72 hours. Sepsis Labs: Recent Labs  Lab 05/27/20 1020  LATICACIDVEN 1.1    Recent Results (from the past 240 hour(s))  SARS CORONAVIRUS 2 (TAT 6-24 HRS) Nasopharyngeal Nasopharyngeal Swab     Status: None   Collection Time: 05/20/20  1:19 PM   Specimen: Nasopharyngeal Swab  Result Value Ref Range Status   SARS Coronavirus 2 NEGATIVE NEGATIVE Final    Comment: (NOTE) SARS-CoV-2 target nucleic acids are NOT DETECTED.  The SARS-CoV-2 RNA is generally detectable in upper and lower respiratory specimens during the acute phase of infection. Negative results do not preclude SARS-CoV-2 infection, do not rule out co-infections with other pathogens, and should not be used as the sole basis for treatment or other patient management decisions. Negative results must be combined with clinical observations, patient history, and epidemiological information. The expected result is Negative.  Fact Sheet for Patients: SugarRoll.be  Fact Sheet for Healthcare Providers: https://www.woods-mathews.com/  This test is not yet approved or cleared by the [place] FDA and  has been authorized for detection and/or diagnosis of SARS-CoV-2 by FDA under an Emergency Use Authorization (EUA). This EUA will remain  in effect (meaning this test can be used) for the duration of the COVID-19 declaration under Se ction 564(b)(1) of the Act, 21 U.S.C. section 360bbb-3(b)(1), unless the authorization is terminated  or revoked sooner.  Performed at [facility], [place] [address], [place], [place] [address]   Urine culture  Status: Abnormal (Preliminary result)   Collection Time: 05/27/20 10:07 AM   Specimen: In/Out Cath Urine  Result Value Ref Range Status   Specimen Description IN/OUT CATH URINE  Final   Special Requests NONE  Final   Culture (A)  Final    >=100,000 COLONIES/mL GRAM NEGATIVE RODS SUSCEPTIBILITIES TO FOLLOW CULTURE REINCUBATED FOR BETTER GROWTH Performed at [facility], [address], [place], [place] [address]    Report Status PENDING  Incomplete  Blood Culture (routine x 2)     Status: None (Preliminary result)   Collection Time: 05/27/20 10:12 AM   Specimen: BLOOD  Result Value Ref Range Status   Specimen Description BLOOD RIGHT ANTECUBITAL  Final   Special Requests   Final    BOTTLES DRAWN AEROBIC AND ANAEROBIC Blood Culture results may not be optimal due to an inadequate volume of blood received in culture bottles   Culture   Final    NO GROWTH < 12 HOURS Performed at [facility], [place] [address], [place], [place] [address]    Report Status PENDING  Incomplete  Blood Culture (routine x 2)     Status: None (Preliminary result)   Collection Time: 05/27/20 10:30 AM   Specimen: BLOOD RIGHT HAND  Result Value Ref Range Status   Specimen Description BLOOD RIGHT HAND  Final   Special Requests   Final    BOTTLES DRAWN AEROBIC ONLY Blood Culture results may not be optimal due to an inadequate volume of blood received in culture bottles   Culture   Final    NO GROWTH < 12 HOURS Performed at [facility], [place] [address] [place], [place] [address]    Report Status PENDING  Incomplete  SARS Coronavirus 2 by RT PCR (hospital order, performed in [facility] hospital lab) Nasopharyngeal Nasopharyngeal Swab     Status: None   Collection Time: 05/27/20 11:18 AM   Specimen: Nasopharyngeal Swab  Result Value Ref Range Status   SARS Coronavirus 2  NEGATIVE NEGATIVE Final    Comment: (NOTE) SARS-CoV-2 target nucleic acids are NOT DETECTED.  The SARS-CoV-2 RNA is generally detectable in upper and lower respiratory specimens during the acute phase of infection. The lowest concentration of SARS-CoV-2 viral copies this assay can detect is 250 copies / mL. A negative result does not preclude SARS-CoV-2 infection and should not be used as the sole basis for treatment or other patient management decisions.  A negative result may occur with improper specimen collection / handling, submission of specimen other than nasopharyngeal swab, presence of viral mutation(s) within the areas targeted by this assay, and inadequate number of viral copies (<250 copies / mL). A negative result must be combined with clinical observations, patient history, and epidemiological information.  Fact Sheet for Patients:   StrictlyIdeas.no  Fact Sheet for Healthcare Providers: BankingDealers.co.za  This test is not yet approved or  cleared by the [place] FDA and has been authorized for detection and/or diagnosis of SARS-CoV-2 by FDA under an Emergency Use Authorization (EUA).  This EUA will remain in effect (meaning this test can be used) for the duration of the COVID-19 declaration under Section 564(b)(1) of the Act, 21 U.S.C. section 360bbb-3(b)(1), unless the authorization is terminated or revoked sooner.  Performed at [facility], [place] [address] [place], [place] [address]   MRSA PCR Screening     Status: None   Collection Time: 05/27/20  8:45 PM   Specimen: Nasal Mucosa; Nasopharyngeal  Result Value Ref Range Status   MRSA by PCR NEGATIVE NEGATIVE Final    Comment:        The GeneXpert MRSA Assay (FDA approved for NASAL specimens only), is one component of a comprehensive MRSA colonization surveillance program. It is not intended to diagnose MRSA infection nor to guide or monitor  treatment for MRSA infections. Performed at [facility], [place] [address] [place], [place] [address]          Radiology Studies: DG Chest Port 1 View  Result Date: 05/27/2020 CLINICAL DATA:  Chest pain and shortness of breath EXAM: PORTABLE CHEST 1 VIEW COMPARISON:  May 16, 2020 FINDINGS: The heart size and mediastinal contours are stable. The heart size is enlarged. Both lungs are clear. The visualized skeletal structures are unremarkable. IMPRESSION: No active cardiopulmonary disease. Electronically Signed   By: Abelardo Diesel M.D.   On: 05/27/2020 11:05        Scheduled Meds: . allopurinol  300 mg Oral Daily  . aspirin  81 mg Oral Daily  . brimonidine  1 drop Both Eyes BID  . budesonide  1 mg Nebulization BID  . collagenase  1 application Topical Daily  . cycloSPORINE  1 drop Both Eyes BID  . dorzolamide  1 drop Both Eyes Daily  . enoxaparin (LOVENOX) injection  40 mg Subcutaneous Q24H  . ferrous sulfate  325 mg Oral Q breakfast  . FLUoxetine  40 mg Oral Daily  . gabapentin  200 mg Oral TID  . guaiFENesin  600 mg Oral BID  . insulin aspart  0-5 Units Subcutaneous QHS  . insulin aspart  0-9 Units Subcutaneous TID WC  . isosorbide mononitrate  60 mg Oral Daily  . latanoprost  1 drop Both Eyes QHS  . levalbuterol  0.63 mg Nebulization TID  . magnesium oxide  400 mg Oral BID  . methylPREDNISolone (SOLU-MEDROL) injection  60 mg Intravenous Q8H  . multivitamin with minerals  1 tablet Oral Daily  . mycophenolate  180 mg Oral BID  . pantoprazole  40 mg Oral BID  . senna-docusate  2 tablet Oral Daily  . sodium chloride flush  3 mL Intravenous Q12H  . tacrolimus  0.5 mg Oral QHS  . tacrolimus  1 mg Oral Daily  . valACYclovir  500 mg Oral BID  . verapamil  240 mg Oral Daily  . cyanocobalamin  1,000 mcg Oral Daily   Continuous Infusions: . ceFEPime (MAXIPIME) IV 2 g (05/27/20 2238)     LOS: 1 day    Time spent: 35 mins.More than 50% of that time was spent in  counseling and/or coordination of care.      Shelly Coss, MD [facility] P7/04/2020, 8:16 AM

## 2020-05-29 DIAGNOSIS — R0682 Tachypnea, not elsewhere classified: Secondary | ICD-10-CM

## 2020-05-29 DIAGNOSIS — R000 Tachycardia, unspecified: Secondary | ICD-10-CM

## 2020-05-29 LAB — CBC WITH DIFFERENTIAL/PLATELET
Abs Immature Granulocytes: 0.07 10*3/uL (ref 0.00–0.07)
Basophils Absolute: 0 10*3/uL (ref 0.0–0.1)
Basophils Relative: 0 %
Eosinophils Absolute: 0 10*3/uL (ref 0.0–0.5)
Eosinophils Relative: 0 %
HCT: 28.6 % — ABNORMAL LOW (ref 36.0–46.0)
Hemoglobin: 8.6 g/dL — ABNORMAL LOW (ref 12.0–15.0)
Immature Granulocytes: 1 %
Lymphocytes Relative: 4 %
Lymphs Abs: 0.6 10*3/uL — ABNORMAL LOW (ref 0.7–4.0)
MCH: 30.9 pg (ref 26.0–34.0)
MCHC: 30.1 g/dL (ref 30.0–36.0)
MCV: 102.9 fL — ABNORMAL HIGH (ref 80.0–100.0)
Monocytes Absolute: 0.2 10*3/uL (ref 0.1–1.0)
Monocytes Relative: 2 %
Neutro Abs: 12.2 10*3/uL — ABNORMAL HIGH (ref 1.7–7.7)
Neutrophils Relative %: 93 %
Platelets: 182 10*3/uL (ref 150–400)
RBC: 2.78 MIL/uL — ABNORMAL LOW (ref 3.87–5.11)
RDW: 20.7 % — ABNORMAL HIGH (ref 11.5–15.5)
WBC: 13 10*3/uL — ABNORMAL HIGH (ref 4.0–10.5)
nRBC: 0 % (ref 0.0–0.2)

## 2020-05-29 LAB — GLUCOSE, CAPILLARY
Glucose-Capillary: 154 mg/dL — ABNORMAL HIGH (ref 70–99)
Glucose-Capillary: 111 mg/dL — ABNORMAL HIGH (ref 70–99)
Glucose-Capillary: 171 mg/dL — ABNORMAL HIGH (ref 70–99)
Glucose-Capillary: 188 mg/dL — ABNORMAL HIGH (ref 70–99)

## 2020-05-29 LAB — URINE CULTURE: Culture: >=100000 — AB

## 2020-05-29 MED ORDER — LEVALBUTEROL HCL 0.63 MG/3ML IN NEBU
0.6300 mg | INHALATION_SOLUTION | Freq: Two times a day (BID) | RESPIRATORY_TRACT | Status: DC
  Administered 2020-05-29 – 2020-06-01 (×6): 0.63 mg via RESPIRATORY_TRACT
  Filled 2020-05-29 (×6): qty 3

## 2020-05-29 MED ORDER — PREDNISONE 5 MG PO TABS
7.5000 mg | ORAL_TABLET | Freq: Every day | ORAL | Status: AC
  Administered 2020-05-30 – 2020-06-02 (×4): 7.5 mg via ORAL
  Filled 2020-05-29 (×4): qty 2

## 2020-05-29 MED ORDER — LEVALBUTEROL HCL 0.63 MG/3ML IN NEBU
0.6300 mg | INHALATION_SOLUTION | Freq: Four times a day (QID) | RESPIRATORY_TRACT | Status: DC | PRN
  Administered 2020-06-03 – 2020-06-04 (×2): 0.63 mg via RESPIRATORY_TRACT
  Filled 2020-05-29 (×2): qty 3

## 2020-05-29 NOTE — Consult Note (Signed)
[facility] Infectious Disease       Reason for Consult: fever    Referring Physician: Dr. Broadus John  Active Problems:   Renal transplant recipient   Chronic diastolic CHF (congestive heart failure) ([place])   Hyponatremia   Sepsis ([place])   Anemia of chronic disease   Acute on chronic respiratory failure with hypoxia ([place])   COPD with acute exacerbation ([place])   History of immunosuppressive therapy   . allopurinol  300 mg Oral Daily  . aspirin  81 mg Oral Daily  . brimonidine  1 drop Both Eyes BID  . budesonide  1 mg Nebulization BID  . collagenase  1 application Topical Daily  . cycloSPORINE  1 drop Both Eyes BID  . dorzolamide  1 drop Both Eyes Daily  . enoxaparin (LOVENOX) injection  40 mg Subcutaneous Q24H  . ferrous sulfate  325 mg Oral Q breakfast  . FLUoxetine  40 mg Oral Daily  . gabapentin  200 mg Oral TID  . guaiFENesin  600 mg Oral BID  . insulin aspart  0-5 Units Subcutaneous QHS  . insulin aspart  0-9 Units Subcutaneous TID WC  . isosorbide mononitrate  60 mg Oral Daily  . latanoprost  1 drop Both Eyes QHS  . levalbuterol  0.63 mg Nebulization BID  . magnesium oxide  400 mg Oral BID  . multivitamin with minerals  1 tablet Oral Daily  . mycophenolate  180 mg Oral BID  . pantoprazole  40 mg Oral BID  . [START ON 05/30/2020] predniSONE  7.5 mg Oral Q breakfast  . senna-docusate  2 tablet Oral Daily  . sodium chloride flush  3 mL Intravenous Q12H  . tacrolimus  0.5 mg Oral QHS  . tacrolimus  1 mg Oral Daily  . valACYclovir  500 mg Oral BID  . verapamil  240 mg Oral Daily  . cyanocobalamin  1,000 mcg Oral Daily    Recommendations: Stop antibiotics Observe on current home prednisone   Assessment: She has recurrent episodes of sirs like symptoms with fever, tachycardia, tachypnea and back in the hospital again. She has recovered each time with steroids and antibiotics.  No positive cultures have been found, though a positive urine culture now but a UA  that is not c/w infection.  This is most consistent with adrenal insufficiency even though she is taking her prednisone daily by her report.  Bacterial infection less likely with the recurrent nature of the disease.  [place] less likely and she has had normal ferritin levels recently and no thrombocytopenia.      Antibiotics: cefepime  HPI: April [name] is a 64 y.o. female with a history of polycystic kidney disease s/p transplant on chronic immunosuppression who came back with similar findings of SIRS as above.  She has again responded to steroids and antibiotics and is back on her home prednisone dose.  She takes no other herbal medications and nothing that is not on her list to interact with her prednisone.  She has had some weight loss.  No rashes or darkening of areas of skin.    Review of Systems:  Constitutional: negative for fevers and chills Gastrointestinal: negative for nausea and diarrhea Genitourinary: negative for frequency and dysuria Integument/breast: negative for rash Musculoskeletal: negative for myalgias and arthralgias All other systems reviewed and are negative    Past Medical History:  Diagnosis Date  . Arthritis   . Asthma   . CHF (congestive heart failure) ([place])   .  COPD (chronic obstructive pulmonary disease) ([place])   . Depression   . Essential hypertension 02/08/2020  . GERD (gastroesophageal reflux disease)   . Hyperlipidemia   . Hyperparathyroidism   . Polycystic kidney   . PONV (postoperative nausea and vomiting)     Social History   Tobacco Use  . Smoking status: Former Smoker    Packs/day: 0.25    Years: 15.00    Pack years: 3.75    Types: Cigarettes    Quit date: 03/22/1998    Years since quitting: 22.2  . Smokeless tobacco: Never Used  Vaping Use  . Vaping Use: Never used  Substance Use Topics  . Alcohol use: No    Comment: social  . Drug use: No    Family History  Problem Relation Age of Onset  . Hypertension Mother   . Heart  disease Father        CABG history  . Polycystic kidney disease Father   . Polycystic kidney disease Brother     Allergies  Allergen Reactions  . Infed [Iron Dextran] Other (See Comments)    Chest tightness  . Pentamidine Itching, Shortness Of Breath and Swelling  . Erythromycin [Erythromycin] Other (See Comments)    Mouth Ulcers  . Iohexol Other (See Comments)    Per patient "she has had a kidney transplant and should never have contrast"  . Oxycodone Nausea Only and Nausea And Vomiting  . Erythromycin Rash    Causes breakout in mouth  . Ultram [Tramadol Hcl] Anxiety    Physical Exam: Constitutional: in no apparent distress  Vitals:   05/29/20 0922 05/29/20 0956  BP:  (!) 118/59  Pulse:  86  Resp:  18  Temp:  97.9 F (36.6 C)  SpO2: 100% 98%   EYES: anicteric Cardiovascular: Cor RRR Respiratory: clear; GI: Bowel sounds are normal, liver is not enlarged, spleen is not enlarged Musculoskeletal: no edema Skin: negatives: no rash Neuro: non-focal  Lab Results  Component Value Date   WBC 13.0 (H) 05/29/2020   HGB 8.6 (L) 05/29/2020   HCT 28.6 (L) 05/29/2020   MCV 102.9 (H) 05/29/2020   PLT 182 05/29/2020    Lab Results  Component Value Date   CREATININE 0.93 05/28/2020   BUN 32 (H) 05/28/2020   NA 137 05/28/2020   K 4.7 05/28/2020   CL 104 05/28/2020   CO2 22 05/28/2020    Lab Results  Component Value Date   ALT 17 05/27/2020   AST 20 05/27/2020   ALKPHOS 78 05/27/2020     Microbiology: Recent Results (from the past 240 hour(s))  SARS CORONAVIRUS 2 (TAT 6-24 HRS) Nasopharyngeal Nasopharyngeal Swab     Status: None   Collection Time: 05/20/20  1:19 PM   Specimen: Nasopharyngeal Swab  Result Value Ref Range Status   SARS Coronavirus 2 NEGATIVE NEGATIVE Final    Comment: (NOTE) SARS-CoV-2 target nucleic acids are NOT DETECTED.  The SARS-CoV-2 RNA is generally detectable in upper and lower respiratory specimens during the acute phase of infection.  Negative results do not preclude SARS-CoV-2 infection, do not rule out co-infections with other pathogens, and should not be used as the sole basis for treatment or other patient management decisions. Negative results must be combined with clinical observations, patient history, and epidemiological information. The expected result is Negative.  Fact Sheet for Patients: SugarRoll.be  Fact Sheet for Healthcare Providers: https://www.woods-mathews.com/  This test is not yet approved or cleared by the [place] FDA and  has  been authorized for detection and/or diagnosis of SARS-CoV-2 by FDA under an Emergency Use Authorization (EUA). This EUA will remain  in effect (meaning this test can be used) for the duration of the COVID-19 declaration under Se ction 564(b)(1) of the Act, 21 U.S.C. section 360bbb-3(b)(1), unless the authorization is terminated or revoked sooner.  Performed at [facility], [place] [address], [place], [place] [address]   Urine culture     Status: Abnormal   Collection Time: 05/27/20 10:07 AM   Specimen: In/Out Cath Urine  Result Value Ref Range Status   Specimen Description IN/OUT CATH URINE  Final   Special Requests   Final    NONE Performed at [facility], [place] [address] [place], [place] [address]    Culture >=100,000 COLONIES/mL PROTEUS MIRABILIS (A)  Final   Report Status 05/29/2020 FINAL  Final   Organism ID, Bacteria PROTEUS MIRABILIS (A)  Final      Susceptibility   Proteus mirabilis - MIC*    AMPICILLIN <=2 SENSITIVE Sensitive     CEFAZOLIN <=4 SENSITIVE Sensitive     CEFTRIAXONE <=0.25 SENSITIVE Sensitive     CIPROFLOXACIN >=4 RESISTANT Resistant     GENTAMICIN <=1 SENSITIVE Sensitive     IMIPENEM 2 SENSITIVE Sensitive     NITROFURANTOIN 128 RESISTANT Resistant     TRIMETH/SULFA <=20 SENSITIVE Sensitive     AMPICILLIN/SULBACTAM <=2 SENSITIVE Sensitive     PIP/TAZO <=4 SENSITIVE Sensitive      * >=100,000 COLONIES/mL PROTEUS MIRABILIS  Blood Culture (routine x 2)     Status: None (Preliminary result)   Collection Time: 05/27/20 10:12 AM   Specimen: BLOOD  Result Value Ref Range Status   Specimen Description BLOOD RIGHT ANTECUBITAL  Final   Special Requests   Final    BOTTLES DRAWN AEROBIC AND ANAEROBIC Blood Culture results may not be optimal due to an inadequate volume of blood received in culture bottles   Culture   Final    NO GROWTH 2 DAYS Performed at [facility], [place] [address], [place], [place] [address]    Report Status PENDING  Incomplete  Blood Culture (routine x 2)     Status: None (Preliminary result)   Collection Time: 05/27/20 10:30 AM   Specimen: BLOOD RIGHT HAND  Result Value Ref Range Status   Specimen Description BLOOD RIGHT HAND  Final   Special Requests   Final    BOTTLES DRAWN AEROBIC ONLY Blood Culture results may not be optimal due to an inadequate volume of blood received in culture bottles   Culture   Final    NO GROWTH 2 DAYS Performed at [facility], [place] [address] [place], [place] [address]    Report Status PENDING  Incomplete  SARS Coronavirus 2 by RT PCR (hospital order, performed in [facility] hospital lab) Nasopharyngeal Nasopharyngeal Swab     Status: None   Collection Time: 05/27/20 11:18 AM   Specimen: Nasopharyngeal Swab  Result Value Ref Range Status   SARS Coronavirus 2 NEGATIVE NEGATIVE Final    Comment: (NOTE) SARS-CoV-2 target nucleic acids are NOT DETECTED.  The SARS-CoV-2 RNA is generally detectable in upper and lower respiratory specimens during the acute phase of infection. The lowest concentration of SARS-CoV-2 viral copies this assay can detect is 250 copies / mL. A negative result does not preclude SARS-CoV-2 infection and should not be used as the sole basis for treatment or other patient management decisions.  A negative  result may occur with improper specimen collection / handling, submission of  specimen other than nasopharyngeal swab, presence of viral mutation(s) within the areas targeted by this assay, and inadequate number of viral copies (<250 copies / mL). A negative result must be combined with clinical observations, patient history, and epidemiological information.  Fact Sheet for Patients:   StrictlyIdeas.no  Fact Sheet for Healthcare Providers: BankingDealers.co.za  This test is not yet approved or  cleared by the [place] FDA and has been authorized for detection and/or diagnosis of SARS-CoV-2 by FDA under an Emergency Use Authorization (EUA).  This EUA will remain in effect (meaning this test can be used) for the duration of the COVID-19 declaration under Section 564(b)(1) of the Act, 21 U.S.C. section 360bbb-3(b)(1), unless the authorization is terminated or revoked sooner.  Performed at [facility], [place] [address], [place], [place] [address]   MRSA PCR Screening     Status: None   Collection Time: 05/27/20  8:45 PM   Specimen: Nasal Mucosa; Nasopharyngeal  Result Value Ref Range Status   MRSA by PCR NEGATIVE NEGATIVE Final    Comment:        The GeneXpert MRSA Assay (FDA approved for NASAL specimens only), is one component of a comprehensive MRSA colonization surveillance program. It is not intended to diagnose MRSA infection nor to guide or monitor treatment for MRSA infections. Performed at [facility], [place] [address] [place], [place] [address]     Jessye Imhoff W Quiana Cobaugh, [place] [facility] Infectious Disease [facility] [email or web address] 05/29/2020, 1:11 PM

## 2020-05-29 NOTE — Progress Notes (Addendum)
PROGRESS NOTE    April [name]  FGH:[id number] DOB: 12/09/1955 DOA: 05/27/2020 PCP: [name], MD   Brief Narrative:  Patient is a 64 year old female with history of polycystic disease status post transplant on chronic chemo immunosuppression therapy, COPD, chronic respiratory failure with hypoxia on 2 L of oxygen per minute at baseline, diastolic CHF, pulmonary hypertension who presents with shortness of breath.  She was just discharged from ER after being hospitalized from 6/21-6/30 for the management of sepsis.  She was found to have wheezing some chest discomfort, she denied cough.  On presentation she was febrile, tachycardic, tachypneic, had leukocytosis.  Chest x-ray did not show any acute abnormalities.   Blood cultures were obtained.  She was started on antibiotics, IV fluids.  Urine culture growing Proteus  Assessment & Plan:  Sepsis like symptoms: Presented yet again with fever-temp of 103, tachycardia, tachypnea -Chest x-ray did not show any pneumonia -UTI growing Proteus but no symptoms of UTI -Patient has a very small chronic sacral ulcer without any surrounding signs or symptoms of infection,, recent MRI on 6/23 was negative for abscess, osteomyelitis -Frequent hospitalizations for same, this is about 10th or 11th admission for similar symptoms in 18months, each time with fever, sepsis type symptoms with negative work-up low -Immunocompromised state puts her at risk of opportunistic infections however this is not clear at this time -Will ask infectious disease to weigh in, currently on cefepime for possible Proteus UTI,?  Discontinue and monitor -Cut down prednisone dose back to 7.5 mg daily and monitor, may need higher dose of steroids at discharge in case this is a component of adrenal insufficiency, surprisingly blood pressure has never been low -Clinically improving  COPD -Chronic respiratory failure on 2 L home O2 at baseline -No wheezing, I think her tachypnea was  in the setting of fever -Chest x-ray unremarkable, continue nebs prn  Chronic diastolic congestive heart failure:  -last echocardiogram showed ejection fraction of 67%, grade 2 diastolic dysfunction.  -Clinically euvolemic  Hyponatremia:  -Improved, hydrated with saline  History of SVT:  - Continue verapamil 240 mg daily.  Hyperglycemia:  - Last hemoglobin A1c of 5.9 as per 04/29/2020.  Monitor CBGs.  History of kidney transplant:  -On chronic immunosuppressive therapy, continue home regimen  Anemia of chronic disease: Currently H&H stable.  History with CKD  History of pulmonary hypertension:: On supplemental oxygen at baseline.  Sacral ulcer: Has a stage II pressure ulcer on the sacrum.  Present on admission.  MRI done on 6/1 3 did not show any signs of abscess or osteomyelitis.  Frequent turning. Ulcer does not look infected.  Debility/deconditioning: Was discharged to rehab( [facility]) on her last admission.  Has significant debility/deconditioning.  Will request a PT/OT evaluation  DVT prophylaxis: Lovenox Code Status: Full Family Communication: Discussed with patient in detail, no family at bedside Status is: Inpatient  Remains inpatient appropriate because: Ongoing work-up, UTI, sepsis-like picture   Dispo: The patient is from: SNF              Anticipated d/c is to: SNF              Anticipated d/c date is: 1-2 days              Patient currently is not medically stable to d/c. Waiting for urine culture report.  Consultants: None  Procedures:None  Antimicrobials:  Anti-infectives (From admission, onward)   Start     Dose/Rate Route Frequency Ordered Stop   05/27/20 2200  ceFEPIme (MAXIPIME) 2 g in sodium chloride 0.9 % 100 mL IVPB  Status:  Discontinued        2 g 200 mL/hr over 30 Minutes Intravenous  Once 05/27/20 1530 05/27/20 1542   05/27/20 2200  ceFEPIme (MAXIPIME) 2 g in sodium chloride 0.9 % 100 mL IVPB     Discontinue     2 g 200 mL/hr over 30 Minutes  Intravenous Every 12 hours 05/27/20 1542     05/27/20 1600  valACYclovir (VALTREX) tablet 500 mg     Discontinue     500 mg Oral 2 times daily 05/27/20 1528     05/27/20 1030  ceFEPIme (MAXIPIME) 2 g in sodium chloride 0.9 % 100 mL IVPB        2 g 200 mL/hr over 30 Minutes Intravenous  Once 05/27/20 1025 05/27/20 1305   05/27/20 1030  vancomycin (VANCOREADY) IVPB 1500 mg/300 mL        1,500 mg 150 mL/hr over 120 Minutes Intravenous  Once 05/27/20 1025 05/27/20 1523   05/27/20 1015  cefTRIAXone (ROCEPHIN) 1 g in sodium chloride 0.9 % 100 mL IVPB  Status:  Discontinued        1 g 200 mL/hr over 30 Minutes Intravenous  Once 05/27/20 1012 05/27/20 1024   05/27/20 1015  azithromycin (ZITHROMAX) 500 mg in sodium chloride 0.9 % 250 mL IVPB  Status:  Discontinued        500 mg 250 mL/hr over 60 Minutes Intravenous  Once 05/27/20 1012 05/27/20 1024      Subjective: -Feels a little weak, no specific symptoms, denies any dyspnea, no nausea vomiting or diarrhea  Objective: Vitals:   05/29/20 0521 05/29/20 0918 05/29/20 0922 05/29/20 0956  BP: 116/64   (!) 118/59  Pulse: 74   86  Resp: 18   18  Temp: 97.8 F (36.6 C)   97.9 F (36.6 C)  TempSrc:    Oral  SpO2: 100% 100% 100% 98%  Weight:      Height:        Intake/Output Summary (Last 24 hours) at 05/29/2020 1131 Last data filed at 05/29/2020 0800 Gross per 24 hour  Intake 360 ml  Output 900 ml  Net -540 ml   Filed Weights   05/27/20 1013  Weight: 72 kg    Examination:  General exam: Chronically Ill looking, female sitting up in bed, AAOx3 HEENT: No JVD CVS: S1-S2, regular rate rhythm Lungs: Poor air movement, no wheezes Abdomen: Soft, nontender, bowel sounds present Extremities, no edema, ecchymosis in both upper arms Skin: Numerous skin breakdowns, scattered bruises, stage II sacral ulcer present on admission    Data Reviewed: I have personally reviewed following labs and imaging studies  CBC: Recent Labs  Lab  05/27/20 1020 05/27/20 1045 05/28/20 0548 05/29/20 0805  WBC 17.1*  --  14.4* 13.0*  NEUTROABS 14.9*  --   --  12.2*  HGB 9.4* 10.5* 9.1* 8.6*  HCT 31.3* 31.0* 30.7* 28.6*  MCV 104.0*  --  103.4* 102.9*  PLT 233  --  162 742   Basic Metabolic Panel: Recent Labs  Lab 05/27/20 1020 05/27/20 1045 05/28/20 0548  NA 132* 133* 137  K 4.1 4.2 4.7  CL 97*  --  104  CO2 26  --  22  GLUCOSE 136*  --  188*  BUN 27*  --  32*  CREATININE 1.02*  --  0.93  CALCIUM 9.2  --  9.4  MG  --   --  2.2   GFR: Estimated Creatinine Clearance: 56.2 mL/min (by C-G formula based on SCr of 0.93 mg/dL). Liver Function Tests: Recent Labs  Lab 05/27/20 1020  AST 20  ALT 17  ALKPHOS 78  BILITOT 1.6*  PROT 7.2  ALBUMIN 2.7*   No results for input(s): LIPASE, AMYLASE in the last 168 hours. No results for input(s): AMMONIA in the last 168 hours. Coagulation Profile: Recent Labs  Lab 05/27/20 1020  INR 1.2   Cardiac Enzymes: No results for input(s): CKTOTAL, CKMB, CKMBINDEX, TROPONINI in the last 168 hours. BNP (last 3 results) No results for input(s): PROBNP in the last 8760 hours. HbA1C: No results for input(s): HGBA1C in the last 72 hours. CBG: Recent Labs  Lab 05/28/20 1121 05/28/20 1648 05/28/20 2024 05/29/20 0640 05/29/20 1126  GLUCAP 197* 203* 165* 154* 111*   Lipid Profile: No results for input(s): CHOL, HDL, LDLCALC, TRIG, CHOLHDL, LDLDIRECT in the last 72 hours. Thyroid Function Tests: No results for input(s): TSH, T4TOTAL, FREET4, T3FREE, THYROIDAB in the last 72 hours. Anemia Panel: No results for input(s): VITAMINB12, FOLATE, FERRITIN, TIBC, IRON, RETICCTPCT in the last 72 hours. Sepsis Labs: Recent Labs  Lab 05/27/20 1020  LATICACIDVEN 1.1    Recent Results (from the past 240 hour(s))  SARS CORONAVIRUS 2 (TAT 6-24 HRS) Nasopharyngeal Nasopharyngeal Swab     Status: None   Collection Time: 05/20/20  1:19 PM   Specimen: Nasopharyngeal Swab  Result Value Ref  Range Status   SARS Coronavirus 2 NEGATIVE NEGATIVE Final    Comment: (NOTE) SARS-CoV-2 target nucleic acids are NOT DETECTED.  The SARS-CoV-2 RNA is generally detectable in upper and lower respiratory specimens during the acute phase of infection. Negative results do not preclude SARS-CoV-2 infection, do not rule out co-infections with other pathogens, and should not be used as the sole basis for treatment or other patient management decisions. Negative results must be combined with clinical observations, patient history, and epidemiological information. The expected result is Negative.  Fact Sheet for Patients: SugarRoll.be  Fact Sheet for Healthcare Providers: https://www.woods-mathews.com/  This test is not yet approved or cleared by the [place] FDA and  has been authorized for detection and/or diagnosis of SARS-CoV-2 by FDA under an Emergency Use Authorization (EUA). This EUA will remain  in effect (meaning this test can be used) for the duration of the COVID-19 declaration under Se ction 564(b)(1) of the Act, 21 U.S.C. section 360bbb-3(b)(1), unless the authorization is terminated or revoked sooner.  Performed at [facility], [place] [address] [place], [place] [address]   Urine culture     Status: Abnormal   Collection Time: 05/27/20 10:07 AM   Specimen: In/Out Cath Urine  Result Value Ref Range Status   Specimen Description IN/OUT CATH URINE  Final   Special Requests   Final    NONE Performed at [facility], [place] [address], [place], [place] [address]    Culture >=100,000 COLONIES/mL PROTEUS MIRABILIS (A)  Final   Report Status 05/29/2020 FINAL  Final   Organism ID, Bacteria PROTEUS MIRABILIS (A)  Final      Susceptibility   Proteus mirabilis - MIC*    AMPICILLIN <=2 SENSITIVE Sensitive     CEFAZOLIN <=4 SENSITIVE Sensitive     CEFTRIAXONE <=0.25 SENSITIVE Sensitive     CIPROFLOXACIN >=4 RESISTANT  Resistant     GENTAMICIN <=1 SENSITIVE Sensitive     IMIPENEM 2 SENSITIVE Sensitive     NITROFURANTOIN 128 RESISTANT Resistant  TRIMETH/SULFA <=20 SENSITIVE Sensitive     AMPICILLIN/SULBACTAM <=2 SENSITIVE Sensitive     PIP/TAZO <=4 SENSITIVE Sensitive     * >=100,000 COLONIES/mL PROTEUS MIRABILIS  Blood Culture (routine x 2)     Status: None (Preliminary result)   Collection Time: 05/27/20 10:12 AM   Specimen: BLOOD  Result Value Ref Range Status   Specimen Description BLOOD RIGHT ANTECUBITAL  Final   Special Requests   Final    BOTTLES DRAWN AEROBIC AND ANAEROBIC Blood Culture results may not be optimal due to an inadequate volume of blood received in culture bottles   Culture   Final    NO GROWTH 1 DAY Performed at [facility], [place] [address] [place], [place] [address]    Report Status PENDING  Incomplete  Blood Culture (routine x 2)     Status: None (Preliminary result)   Collection Time: 05/27/20 10:30 AM   Specimen: BLOOD RIGHT HAND  Result Value Ref Range Status   Specimen Description BLOOD RIGHT HAND  Final   Special Requests   Final    BOTTLES DRAWN AEROBIC ONLY Blood Culture results may not be optimal due to an inadequate volume of blood received in culture bottles   Culture   Final    NO GROWTH 1 DAY Performed at [facility], [place] [address] [place], [place] [address]    Report Status PENDING  Incomplete  SARS Coronavirus 2 by RT PCR (hospital order, performed in [facility] hospital lab) Nasopharyngeal Nasopharyngeal Swab     Status: None   Collection Time: 05/27/20 11:18 AM   Specimen: Nasopharyngeal Swab  Result Value Ref Range Status   SARS Coronavirus 2 NEGATIVE NEGATIVE Final    Comment: (NOTE) SARS-CoV-2 target nucleic acids are NOT DETECTED.  The SARS-CoV-2 RNA is generally detectable in upper and lower respiratory specimens during the acute phase of infection. The lowest concentration of SARS-CoV-2 viral copies this assay can detect is  250 copies / mL. A negative result does not preclude SARS-CoV-2 infection and should not be used as the sole basis for treatment or other patient management decisions.  A negative result may occur with improper specimen collection / handling, submission of specimen other than nasopharyngeal swab, presence of viral mutation(s) within the areas targeted by this assay, and inadequate number of viral copies (<250 copies / mL). A negative result must be combined with clinical observations, patient history, and epidemiological information.  Fact Sheet for Patients:   StrictlyIdeas.no  Fact Sheet for Healthcare Providers: BankingDealers.co.za  This test is not yet approved or  cleared by the [place] FDA and has been authorized for detection and/or diagnosis of SARS-CoV-2 by FDA under an Emergency Use Authorization (EUA).  This EUA will remain in effect (meaning this test can be used) for the duration of the COVID-19 declaration under Section 564(b)(1) of the Act, 21 U.S.C. section 360bbb-3(b)(1), unless the authorization is terminated or revoked sooner.  Performed at [facility], [place] [address] [place], [place] [address]   MRSA PCR Screening     Status: None   Collection Time: 05/27/20  8:45 PM   Specimen: Nasal Mucosa; Nasopharyngeal  Result Value Ref Range Status   MRSA by PCR NEGATIVE NEGATIVE Final    Comment:        The GeneXpert MRSA Assay (FDA approved for NASAL specimens only), is one component of a comprehensive MRSA colonization surveillance program. It is not intended to diagnose MRSA infection nor to  guide or monitor treatment for MRSA infections. Performed at [facility], [place] [address] [place], [place] [address]          Radiology Studies: No results found.      Scheduled Meds: . allopurinol  300 mg Oral Daily  . aspirin  81 mg Oral Daily  . brimonidine  1 drop Both Eyes BID  .  budesonide  1 mg Nebulization BID  . collagenase  1 application Topical Daily  . cycloSPORINE  1 drop Both Eyes BID  . dorzolamide  1 drop Both Eyes Daily  . enoxaparin (LOVENOX) injection  40 mg Subcutaneous Q24H  . ferrous sulfate  325 mg Oral Q breakfast  . FLUoxetine  40 mg Oral Daily  . gabapentin  200 mg Oral TID  . guaiFENesin  600 mg Oral BID  . insulin aspart  0-5 Units Subcutaneous QHS  . insulin aspart  0-9 Units Subcutaneous TID WC  . isosorbide mononitrate  60 mg Oral Daily  . latanoprost  1 drop Both Eyes QHS  . levalbuterol  0.63 mg Nebulization BID  . magnesium oxide  400 mg Oral BID  . multivitamin with minerals  1 tablet Oral Daily  . mycophenolate  180 mg Oral BID  . pantoprazole  40 mg Oral BID  . [START ON 05/30/2020] predniSONE  7.5 mg Oral Q breakfast  . senna-docusate  2 tablet Oral Daily  . sodium chloride flush  3 mL Intravenous Q12H  . tacrolimus  0.5 mg Oral QHS  . tacrolimus  1 mg Oral Daily  . valACYclovir  500 mg Oral BID  . verapamil  240 mg Oral Daily  . cyanocobalamin  1,000 mcg Oral Daily   Continuous Infusions: . ceFEPime (MAXIPIME) IV 2 g (05/28/20 2208)     LOS: 2 days    Time spent: 35 mins.More than 50% of that time was spent in counseling and/or coordination of care.  Domenic Polite, MD [facility] P7/05/2020, 11:31 AM

## 2020-05-29 NOTE — Plan of Care (Signed)
°  Problem: Respiratory: °Goal: Ability to maintain adequate ventilation will improve °Outcome: Progressing °  °

## 2020-05-29 NOTE — Plan of Care (Signed)
  Problem: Respiratory: Goal: Ability to maintain adequate ventilation will improve Outcome: Progressing   Problem: Activity: Goal: Risk for activity intolerance will decrease Outcome: Progressing

## 2020-05-29 NOTE — Plan of Care (Signed)
°  Problem: Coping: °Goal: Level of anxiety will decrease °Outcome: Progressing °  °

## 2020-05-30 LAB — GLUCOSE, CAPILLARY
Glucose-Capillary: 121 mg/dL — ABNORMAL HIGH (ref 70–99)
Glucose-Capillary: 134 mg/dL — ABNORMAL HIGH (ref 70–99)
Glucose-Capillary: 178 mg/dL — ABNORMAL HIGH (ref 70–99)
Glucose-Capillary: 118 mg/dL — ABNORMAL HIGH (ref 70–99)

## 2020-05-30 LAB — CBC
HCT: 29.1 % — ABNORMAL LOW (ref 36.0–46.0)
Hemoglobin: 8.6 g/dL — ABNORMAL LOW (ref 12.0–15.0)
MCH: 30.5 pg (ref 26.0–34.0)
MCHC: 29.6 g/dL — ABNORMAL LOW (ref 30.0–36.0)
MCV: 103.2 fL — ABNORMAL HIGH (ref 80.0–100.0)
Platelets: 176 10*3/uL (ref 150–400)
RBC: 2.82 MIL/uL — ABNORMAL LOW (ref 3.87–5.11)
RDW: 20.4 % — ABNORMAL HIGH (ref 11.5–15.5)
WBC: 11 10*3/uL — ABNORMAL HIGH (ref 4.0–10.5)
nRBC: 0 % (ref 0.0–0.2)

## 2020-05-30 LAB — COMPREHENSIVE METABOLIC PANEL
ALT: 16 U/L (ref 0–44)
AST: 16 U/L (ref 15–41)
Albumin: 2.4 g/dL — ABNORMAL LOW (ref 3.5–5.0)
Alkaline Phosphatase: 63 U/L (ref 38–126)
Anion gap: 10 (ref 5–15)
BUN: 53 mg/dL — ABNORMAL HIGH (ref 8–23)
CO2: 20 mmol/L — ABNORMAL LOW (ref 22–32)
Calcium: 9.7 mg/dL (ref 8.9–10.3)
Chloride: 106 mmol/L (ref 98–111)
Creatinine, Ser: 1.22 mg/dL — ABNORMAL HIGH (ref 0.44–1.00)
GFR calc Af Amer: 55 mL/min — ABNORMAL LOW (ref >60)
GFR calc non Af Amer: 47 mL/min — ABNORMAL LOW (ref >60)
Glucose, Bld: 166 mg/dL — ABNORMAL HIGH (ref 70–99)
Potassium: 4.4 mmol/L (ref 3.5–5.1)
Sodium: 136 mmol/L (ref 135–145)
Total Bilirubin: 0.7 mg/dL (ref 0.3–1.2)
Total Protein: 6.5 g/dL (ref 6.5–8.1)

## 2020-05-30 LAB — LACTATE DEHYDROGENASE: LDH: 91 U/L — ABNORMAL LOW (ref 98–192)

## 2020-05-30 MED ORDER — ENOXAPARIN SODIUM 40 MG/0.4ML ~~LOC~~ SOLN
40.0000 mg | SUBCUTANEOUS | Status: DC
  Administered 2020-05-31 – 2020-06-05 (×6): 40 mg via SUBCUTANEOUS
  Filled 2020-05-30 (×6): qty 0.4

## 2020-05-30 MED ORDER — SODIUM CHLORIDE 0.9 % IV SOLN: INTRAVENOUS | Status: AC

## 2020-05-30 NOTE — Plan of Care (Signed)
°  Problem: Fluid Volume: Goal: Hemodynamic stability will improve Outcome: Progressing   Problem: Clinical Measurements: Goal: Diagnostic test results will improve Outcome: Progressing Goal: Signs and symptoms of infection will decrease Outcome: Progressing   Problem: Respiratory: Goal: Ability to maintain adequate ventilation will improve Outcome: Progressing   Problem: Education: Goal: Knowledge of General Education information will improve Description: Including pain rating scale, medication(s)/side effects and non-pharmacologic comfort measures Outcome: Progressing   Problem: Health Behavior/Discharge Planning: Goal: Ability to manage health-related needs will improve Outcome: Progressing   Problem: Clinical Measurements: Goal: Ability to maintain clinical measurements within normal limits will improve Outcome: Progressing Goal: Will remain free from infection Outcome: Progressing Goal: Diagnostic test results will improve Outcome: Progressing Goal: Respiratory complications will improve Outcome: Progressing Goal: Cardiovascular complication will be avoided Outcome: Progressing   Problem: Activity: Goal: Risk for activity intolerance will decrease Outcome: Progressing   Problem: Nutrition: Goal: Adequate nutrition will be maintained Outcome: Progressing   Problem: Pain Managment: Goal: General experience of comfort will improve Outcome: Progressing   Problem: Safety: Goal: Ability to remain free from injury will improve Outcome: Progressing   Problem: Skin Integrity: Goal: Risk for impaired skin integrity will decrease Outcome: Progressing

## 2020-05-30 NOTE — Progress Notes (Signed)
PROGRESS NOTE    April [name]  RWE:[id number] DOB: 09-17-1956 DOA: 05/27/2020 PCP: [name], MD   Brief Narrative:  Patient is a 64 year old female with history of polycystic disease status post transplant on chronic chemo immunosuppression therapy, COPD, chronic respiratory failure with hypoxia on 2 L of oxygen per minute at baseline, diastolic CHF, pulmonary hypertension who presents with shortness of breath.  She was just discharged from ER after being hospitalized from 6/21-6/30 for the management of sepsis.  She was found to have wheezing some chest discomfort, she denied cough.  On presentation she was febrile, tachycardic, tachypneic, had leukocytosis.  Chest x-ray did not show any acute abnormalities.   Blood cultures were obtained.  She was started on antibiotics, IV fluids.  Urine culture growing Proteus  Assessment & Plan:  Sepsis like symptoms:  FUO Presented yet again with fever-temp of 103, tachycardia, tachypnea -Chest x-ray without acute findings -UTI growing Proteus but no symptoms of UTI -Patient has a very small chronic sacral ulcer without any surrounding signs or symptoms of infection,, recent MRI on 6/23 was negative for abscess, osteomyelitis, recent CT chest/abd pelvis negative too, dopplers negative -Frequent hospitalizations for same, this is the 11th admission for similar symptoms in 52months, each time with fever, sepsis type symptoms with negative work-up low -Immunocompromised state puts her at risk of opportunistic infections however this is not clear at this time -appreciate ID input, monitor off Abx on home dose steroids -will plan BM aspiration and biopsy this admission -also check periph smear  COPD -Chronic respiratory failure on 2 L home O2 at baseline -No wheezing, I think her tachypnea was in the setting of fever -Chest x-ray unremarkable, continue nebs prn  Chronic diastolic congestive heart failure:  -last echocardiogram showed ejection  fraction of 61%, grade 2 diastolic dysfunction.  -Clinically euvolemic  Hyponatremia:  -Improved, hydrated with saline  History of SVT:  - Continue verapamil 240 mg daily.  Hyperglycemia:  - Last hemoglobin A1c of 5.9 as per 04/29/2020.  Monitor CBGs.  History of kidney transplant:  -On chronic immunosuppressive therapy, continue home regimen  Anemia of chronic disease: Currently H&H stable.  History with CKD  History of pulmonary hypertension:: On supplemental oxygen at baseline.  Sacral ulcer: Has a stage II pressure ulcer on the sacrum.  Present on admission.  MRI done on 6/1 3 did not show any signs of abscess or osteomyelitis.  Frequent turning. Ulcer does not look infected.  Debility/deconditioning: Was discharged to rehab( [facility]) on her last admission.  Has significant debility/deconditioning. -PT following  DVT prophylaxis: Lovenox Code Status: Full Family Communication: Discussed with patient in detail, no family at bedside Status is: Inpatient  Remains inpatient appropriate because: Ongoing work-up, UTI, sepsis-like picture   Dispo: The patient is from: SNF              Anticipated d/c is to: SNF              Anticipated d/c date is: 4-5days              Patient currently is not medically stable to d/c.  Consultants: ID  Procedures:None  Antimicrobials:  Anti-infectives (From admission, onward)   Start     Dose/Rate Route Frequency Ordered Stop   05/27/20 2200  ceFEPIme (MAXIPIME) 2 g in sodium chloride 0.9 % 100 mL IVPB  Status:  Discontinued        2 g 200 mL/hr over 30 Minutes Intravenous  Once 05/27/20 1530 05/27/20  1542   05/27/20 2200  ceFEPIme (MAXIPIME) 2 g in sodium chloride 0.9 % 100 mL IVPB  Status:  Discontinued        2 g 200 mL/hr over 30 Minutes Intravenous Every 12 hours 05/27/20 1542 05/29/20 1520   05/27/20 1600  valACYclovir (VALTREX) tablet 500 mg     Discontinue     500 mg Oral 2 times daily 05/27/20 1528     05/27/20 1030  ceFEPIme  (MAXIPIME) 2 g in sodium chloride 0.9 % 100 mL IVPB        2 g 200 mL/hr over 30 Minutes Intravenous  Once 05/27/20 1025 05/27/20 1305   05/27/20 1030  vancomycin (VANCOREADY) IVPB 1500 mg/300 mL        1,500 mg 150 mL/hr over 120 Minutes Intravenous  Once 05/27/20 1025 05/27/20 1523   05/27/20 1015  cefTRIAXone (ROCEPHIN) 1 g in sodium chloride 0.9 % 100 mL IVPB  Status:  Discontinued        1 g 200 mL/hr over 30 Minutes Intravenous  Once 05/27/20 1012 05/27/20 1024   05/27/20 1015  azithromycin (ZITHROMAX) 500 mg in sodium chloride 0.9 % 250 mL IVPB  Status:  Discontinued        500 mg 250 mL/hr over 60 Minutes Intravenous  Once 05/27/20 1012 05/27/20 1024      Subjective: -Feels okay overall, no specific symptoms today, afebrile  Objective: Vitals:   05/29/20 2022 05/30/20 0443 05/30/20 0745 05/30/20 0752  BP: 125/70 126/83    Pulse: 81 77 86   Resp: 18 18 19    Temp: 98 F (36.7 C) 97.8 F (36.6 C)    TempSrc: Oral     SpO2: 100% 99% 100% 100%  Weight:  75.8 kg    Height:        Intake/Output Summary (Last 24 hours) at 05/30/2020 1145 Last data filed at 05/30/2020 0600 Gross per 24 hour  Intake 320 ml  Output 2200 ml  Net -1880 ml   Filed Weights   05/27/20 1013 05/30/20 0443  Weight: 72 kg 75.8 kg    Examination:  General exam: Chronically ill female sitting up in bed, AAOx3, no distress HEENT: No JVD CVS: S1-S2, regular rate rhythm Lungs: Poor air movement bilaterally otherwise clear Abdomen: Soft, nontender, bowel sounds present Extremities: No edema, ecchymosis in both upper arms  Skin: Numerous skin breakdowns, scattered bruises, stage II sacral ulcer present on admission    Data Reviewed: I have personally reviewed following labs and imaging studies  CBC: Recent Labs  Lab 05/27/20 1020 05/27/20 1045 05/28/20 0548 05/29/20 0805 05/30/20 0824  WBC 17.1*  --  14.4* 13.0* 11.0*  NEUTROABS 14.9*  --   --  12.2*  --   HGB 9.4* 10.5* 9.1* 8.6* 8.6*   HCT 31.3* 31.0* 30.7* 28.6* 29.1*  MCV 104.0*  --  103.4* 102.9* 103.2*  PLT 233  --  162 182 751   Basic Metabolic Panel: Recent Labs  Lab 05/27/20 1020 05/27/20 1045 05/28/20 0548 05/30/20 0824  NA 132* 133* 137 136  K 4.1 4.2 4.7 4.4  CL 97*  --  104 106  CO2 26  --  22 20*  GLUCOSE 136*  --  188* 166*  BUN 27*  --  32* 53*  CREATININE 1.02*  --  0.93 1.22*  CALCIUM 9.2  --  9.4 9.7  MG  --   --  2.2  --    GFR: Estimated Creatinine Clearance: 44  mL/min (A) (by C-G formula based on SCr of 1.22 mg/dL (H)). Liver Function Tests: Recent Labs  Lab 05/27/20 1020 05/30/20 0824  AST 20 16  ALT 17 16  ALKPHOS 78 63  BILITOT 1.6* 0.7  PROT 7.2 6.5  ALBUMIN 2.7* 2.4*   No results for input(s): LIPASE, AMYLASE in the last 168 hours. No results for input(s): AMMONIA in the last 168 hours. Coagulation Profile: Recent Labs  Lab 05/27/20 1020  INR 1.2   Cardiac Enzymes: No results for input(s): CKTOTAL, CKMB, CKMBINDEX, TROPONINI in the last 168 hours. BNP (last 3 results) No results for input(s): PROBNP in the last 8760 hours. HbA1C: No results for input(s): HGBA1C in the last 72 hours. CBG: Recent Labs  Lab 05/29/20 0640 05/29/20 1126 05/29/20 1659 05/29/20 2022 05/30/20 0632  GLUCAP 154* 111* 171* 188* 121*   Lipid Profile: No results for input(s): CHOL, HDL, LDLCALC, TRIG, CHOLHDL, LDLDIRECT in the last 72 hours. Thyroid Function Tests: No results for input(s): TSH, T4TOTAL, FREET4, T3FREE, THYROIDAB in the last 72 hours. Anemia Panel: No results for input(s): VITAMINB12, FOLATE, FERRITIN, TIBC, IRON, RETICCTPCT in the last 72 hours. Sepsis Labs: Recent Labs  Lab 05/27/20 1020  LATICACIDVEN 1.1    Recent Results (from the past 240 hour(s))  SARS CORONAVIRUS 2 (TAT 6-24 HRS) Nasopharyngeal Nasopharyngeal Swab     Status: None   Collection Time: 05/20/20  1:19 PM   Specimen: Nasopharyngeal Swab  Result Value Ref Range Status   SARS Coronavirus 2  NEGATIVE NEGATIVE Final    Comment: (NOTE) SARS-CoV-2 target nucleic acids are NOT DETECTED.  The SARS-CoV-2 RNA is generally detectable in upper and lower respiratory specimens during the acute phase of infection. Negative results do not preclude SARS-CoV-2 infection, do not rule out co-infections with other pathogens, and should not be used as the sole basis for treatment or other patient management decisions. Negative results must be combined with clinical observations, patient history, and epidemiological information. The expected result is Negative.  Fact Sheet for Patients: SugarRoll.be  Fact Sheet for Healthcare Providers: https://www.woods-mathews.com/  This test is not yet approved or cleared by the [place] FDA and  has been authorized for detection and/or diagnosis of SARS-CoV-2 by FDA under an Emergency Use Authorization (EUA). This EUA will remain  in effect (meaning this test can be used) for the duration of the COVID-19 declaration under Se ction 564(b)(1) of the Act, 21 U.S.C. section 360bbb-3(b)(1), unless the authorization is terminated or revoked sooner.  Performed at [facility], [place] [address] [place], [place] [address]   Urine culture     Status: Abnormal   Collection Time: 05/27/20 10:07 AM   Specimen: In/Out Cath Urine  Result Value Ref Range Status   Specimen Description IN/OUT CATH URINE  Final   Special Requests   Final    NONE Performed at [facility], [place] [address], [place], [place] [address]    Culture >=100,000 COLONIES/mL PROTEUS MIRABILIS (A)  Final   Report Status 05/29/2020 FINAL  Final   Organism ID, Bacteria PROTEUS MIRABILIS (A)  Final      Susceptibility   Proteus mirabilis - MIC*    AMPICILLIN <=2 SENSITIVE Sensitive     CEFAZOLIN <=4 SENSITIVE Sensitive     CEFTRIAXONE <=0.25 SENSITIVE Sensitive     CIPROFLOXACIN >=4 RESISTANT Resistant     GENTAMICIN <=1 SENSITIVE  Sensitive     IMIPENEM 2 SENSITIVE Sensitive     NITROFURANTOIN 128 RESISTANT  Resistant     TRIMETH/SULFA <=20 SENSITIVE Sensitive     AMPICILLIN/SULBACTAM <=2 SENSITIVE Sensitive     PIP/TAZO <=4 SENSITIVE Sensitive     * >=100,000 COLONIES/mL PROTEUS MIRABILIS  Blood Culture (routine x 2)     Status: None (Preliminary result)   Collection Time: 05/27/20 10:12 AM   Specimen: BLOOD  Result Value Ref Range Status   Specimen Description BLOOD RIGHT ANTECUBITAL  Final   Special Requests   Final    BOTTLES DRAWN AEROBIC AND ANAEROBIC Blood Culture results may not be optimal due to an inadequate volume of blood received in culture bottles   Culture   Final    NO GROWTH 3 DAYS Performed at [facility], [place] [address], [place], [place] [address]    Report Status PENDING  Incomplete  Blood Culture (routine x 2)     Status: None (Preliminary result)   Collection Time: 05/27/20 10:30 AM   Specimen: BLOOD RIGHT HAND  Result Value Ref Range Status   Specimen Description BLOOD RIGHT HAND  Final   Special Requests   Final    BOTTLES DRAWN AEROBIC ONLY Blood Culture results may not be optimal due to an inadequate volume of blood received in culture bottles   Culture   Final    NO GROWTH 3 DAYS Performed at [facility], [place] [address] [place], [place] [address]    Report Status PENDING  Incomplete  SARS Coronavirus 2 by RT PCR (hospital order, performed in [facility] hospital lab) Nasopharyngeal Nasopharyngeal Swab     Status: None   Collection Time: 05/27/20 11:18 AM   Specimen: Nasopharyngeal Swab  Result Value Ref Range Status   SARS Coronavirus 2 NEGATIVE NEGATIVE Final    Comment: (NOTE) SARS-CoV-2 target nucleic acids are NOT DETECTED.  The SARS-CoV-2 RNA is generally detectable in upper and lower respiratory specimens during the acute phase of infection. The lowest concentration of SARS-CoV-2 viral copies this assay can detect is 250 copies / mL. A negative result  does not preclude SARS-CoV-2 infection and should not be used as the sole basis for treatment or other patient management decisions.  A negative result may occur with improper specimen collection / handling, submission of specimen other than nasopharyngeal swab, presence of viral mutation(s) within the areas targeted by this assay, and inadequate number of viral copies (<250 copies / mL). A negative result must be combined with clinical observations, patient history, and epidemiological information.  Fact Sheet for Patients:   StrictlyIdeas.no  Fact Sheet for Healthcare Providers: BankingDealers.co.za  This test is not yet approved or  cleared by the [place] FDA and has been authorized for detection and/or diagnosis of SARS-CoV-2 by FDA under an Emergency Use Authorization (EUA).  This EUA will remain in effect (meaning this test can be used) for the duration of the COVID-19 declaration under Section 564(b)(1) of the Act, 21 U.S.C. section 360bbb-3(b)(1), unless the authorization is terminated or revoked sooner.  Performed at [facility], [place] [address], [place], [place] [address]   MRSA PCR Screening     Status: None   Collection Time: 05/27/20  8:45 PM   Specimen: Nasal Mucosa; Nasopharyngeal  Result Value Ref Range Status   MRSA by PCR NEGATIVE NEGATIVE Final    Comment:        The GeneXpert MRSA Assay (FDA approved for NASAL specimens only), is one component of a comprehensive MRSA colonization surveillance program. It is not intended to  diagnose MRSA infection nor to guide or monitor treatment for MRSA infections. Performed at [facility], [place] [address], [place], [place] [address]          Radiology Studies: No results found.      Scheduled Meds: . allopurinol  300 mg Oral Daily  . aspirin  81 mg Oral Daily  . brimonidine  1 drop Both Eyes BID  . budesonide  1 mg Nebulization BID  .  collagenase  1 application Topical Daily  . cycloSPORINE  1 drop Both Eyes BID  . dorzolamide  1 drop Both Eyes Daily  . enoxaparin (LOVENOX) injection  40 mg Subcutaneous Q24H  . ferrous sulfate  325 mg Oral Q breakfast  . FLUoxetine  40 mg Oral Daily  . gabapentin  200 mg Oral TID  . guaiFENesin  600 mg Oral BID  . insulin aspart  0-5 Units Subcutaneous QHS  . insulin aspart  0-9 Units Subcutaneous TID WC  . isosorbide mononitrate  60 mg Oral Daily  . latanoprost  1 drop Both Eyes QHS  . levalbuterol  0.63 mg Nebulization BID  . magnesium oxide  400 mg Oral BID  . multivitamin with minerals  1 tablet Oral Daily  . mycophenolate  180 mg Oral BID  . pantoprazole  40 mg Oral BID  . predniSONE  7.5 mg Oral Q breakfast  . senna-docusate  2 tablet Oral Daily  . sodium chloride flush  3 mL Intravenous Q12H  . tacrolimus  0.5 mg Oral QHS  . tacrolimus  1 mg Oral Daily  . valACYclovir  500 mg Oral BID  . verapamil  240 mg Oral Daily  . cyanocobalamin  1,000 mcg Oral Daily   Continuous Infusions: . sodium chloride       LOS: 3 days    Time spent: 36min  Domenic Polite, MD [facility] P7/06/2020, 11:45 AM

## 2020-05-30 NOTE — Progress Notes (Signed)
Physical Therapy Treatment Patient Details Name: KYNNEDY CARRENO MRN: [id number] DOB: 1956/05/21 Today's Date: 05/30/2020    History of Present Illness TOSHI ISHII is a 64 y.o. female with medical history significant of polycystic kidney disease s/p renal transplant on chronic immunosuppressive therapy, COPD, chronic respiratory failure with hypoxia on 2 L of nasal cannula oxygen at baseline, diastolic CHF, and pulmonary hypertension.  She presents with complaints of progressively worsening shortness of breath over the last 2 days.  She just recently been hospitalized from 6/21-6/30 with sepsis of origin.    PT Comments    Pt seen for mobility progression; however, she remains very limited overall secondary to generalized weakness and fatigue. Pt on RA throughout with SpO2 maintaining >95% with activity and 100% at rest. Pt agreeable to sitting up in chair at end of session with special pressure relief cushion in seat. Continue to recommend pt d/c to SNF for further intensive therapy services prior to returning home. Pt would continue to benefit from skilled physical therapy services at this time while admitted and after d/c to address the below listed limitations in order to improve overall safety and independence with functional mobility.    Follow Up Recommendations  SNF;Supervision/Assistance - 24 hour     Equipment Recommendations  None recommended by PT    Recommendations for Other Services       Precautions / Restrictions Precautions Precautions: Fall Restrictions Weight Bearing Restrictions: No LUE Weight Bearing: Weight bearing as tolerated LLE Weight Bearing: Weight bearing as tolerated    Mobility  Bed Mobility Overal bed mobility: Needs Assistance Bed Mobility: Supine to Sit     Supine to sit: Supervision     General bed mobility comments: no physical assistance needed  Transfers Overall transfer level: Needs assistance Equipment used: Rolling walker (2  wheeled) Transfers: Sit to/from Stand Sit to Stand: Min guard         General transfer comment: good technique utilized, min guard for safety with transition  Ambulation/Gait Ambulation/Gait assistance: Min guard Gait Distance (Feet): 2 Feet Assistive device: Rolling walker (2 wheeled) Gait Pattern/deviations: Step-to pattern;Shuffle;Decreased stride length Gait velocity: decreased   General Gait Details: pt only able to tolerate take steps forwards towards chair until she became dizzy and unable to go any further (of note, pt's RN reporting that she was given a lot of medications prior to session)   Stairs             Wheelchair Mobility    Modified Rankin (Stroke Patients Only)       Balance Overall balance assessment: Needs assistance Sitting-balance support: No upper extremity supported;Feet supported Sitting balance-Leahy Scale: Good     Standing balance support: Bilateral upper extremity supported;During functional activity Standing balance-Leahy Scale: Poor Standing balance comment: reliant on RW                            Cognition Arousal/Alertness: Awake/alert Behavior During Therapy: WFL for tasks assessed/performed Overall Cognitive Status: Within Functional Limits for tasks assessed                                        Exercises General Exercises - Lower Extremity Ankle Circles/Pumps: AROM;Both;10 reps;Seated Long Arc Quad: AROM;Strengthening;Both;10 reps;Seated Hip Flexion/Marching: AROM;Strengthening;Both;10 reps;Seated    General Comments        Pertinent Vitals/Pain Pain Assessment:  No/denies pain    Home Living                      Prior Function            PT Goals (current goals can now be found in the care plan section) Acute Rehab PT Goals PT Goal Formulation: With patient Time For Goal Achievement: 06/11/20 Potential to Achieve Goals: Fair Progress towards PT goals: Progressing  toward goals    Frequency    Min 3X/week      PT Plan Current plan remains appropriate;Frequency needs to be updated    Co-evaluation              AM-PAC PT "6 Clicks" Mobility   Outcome Measure  Help needed turning from your back to your side while in a flat bed without using bedrails?: None Help needed moving from lying on your back to sitting on the side of a flat bed without using bedrails?: None Help needed moving to and from a bed to a chair (including a wheelchair)?: A Little Help needed standing up from a chair using your arms (e.g., wheelchair or bedside chair)?: A Little Help needed to walk in hospital room?: A Little Help needed climbing 3-5 steps with a railing? : A Lot 6 Click Score: 19    End of Session   Activity Tolerance: Patient tolerated treatment well Patient left: in chair;with chair alarm set;with call bell/phone within reach Nurse Communication: Mobility status PT Visit Diagnosis: Muscle weakness (generalized) (M62.81);Other abnormalities of gait and mobility (R26.89)     Time: 4665-9935 PT Time Calculation (min) (ACUTE ONLY): 20 min  Charges:  $Therapeutic Activity: 8-22 mins                     Anastasio Champion, DPT  Acute Rehabilitation Services Pager [phone] Office [place] 05/30/2020, 12:32 PM

## 2020-05-30 NOTE — Progress Notes (Signed)
[facility] for Infectious Disease   Reason for visit: Follow up on fever  Interval History: no fever, no new symptoms.  No sob.  No chills.     Physical Exam: Constitutional:  Vitals:   05/30/20 0745 05/30/20 0752  BP:    Pulse: 86   Resp: 19   Temp:    SpO2: 100% 100%   patient appears in NAD Eyes: anicteric HENT: no thrush; bilateral cheek erythema/rash Respiratory: Normal respiratory effort; CTA B Cardiovascular: RRR GI: soft, nt, nd  Review of Systems: Constitutional: negative for fevers, chills, sweats and anorexia Gastrointestinal: negative for nausea and diarrhea Integument/breast: negative for pruritus  Lab Results  Component Value Date   WBC 11.0 (H) 05/30/2020   HGB 8.6 (L) 05/30/2020   HCT 29.1 (L) 05/30/2020   MCV 103.2 (H) 05/30/2020   PLT 176 05/30/2020    Lab Results  Component Value Date   CREATININE 1.22 (H) 05/30/2020   BUN 53 (H) 05/30/2020   NA 136 05/30/2020   K 4.4 05/30/2020   CL 106 05/30/2020   CO2 20 (L) 05/30/2020    Lab Results  Component Value Date   ALT 16 05/30/2020   AST 16 05/30/2020   ALKPHOS 63 05/30/2020     Microbiology: Recent Results (from the past 240 hour(s))  SARS CORONAVIRUS 2 (TAT 6-24 HRS) Nasopharyngeal Nasopharyngeal Swab     Status: None   Collection Time: 05/20/20  1:19 PM   Specimen: Nasopharyngeal Swab  Result Value Ref Range Status   SARS Coronavirus 2 NEGATIVE NEGATIVE Final    Comment: (NOTE) SARS-CoV-2 target nucleic acids are NOT DETECTED.  The SARS-CoV-2 RNA is generally detectable in upper and lower respiratory specimens during the acute phase of infection. Negative results do not preclude SARS-CoV-2 infection, do not rule out co-infections with other pathogens, and should not be used as the sole basis for treatment or other patient management decisions. Negative results must be combined with clinical observations, patient history, and epidemiological information. The  expected result is Negative.  Fact Sheet for Patients: SugarRoll.be  Fact Sheet for Healthcare Providers: https://www.woods-mathews.com/  This test is not yet approved or cleared by the [place] FDA and  has been authorized for detection and/or diagnosis of SARS-CoV-2 by FDA under an Emergency Use Authorization (EUA). This EUA will remain  in effect (meaning this test can be used) for the duration of the COVID-19 declaration under Se ction 564(b)(1) of the Act, 21 U.S.C. section 360bbb-3(b)(1), unless the authorization is terminated or revoked sooner.  Performed at [facility], [place] [address], [place], [place] [address]   Urine culture     Status: Abnormal   Collection Time: 05/27/20 10:07 AM   Specimen: In/Out Cath Urine  Result Value Ref Range Status   Specimen Description IN/OUT CATH URINE  Final   Special Requests   Final    NONE Performed at [facility], [place] [address] [place], [place] [address]    Culture >=100,000 COLONIES/mL PROTEUS MIRABILIS (A)  Final   Report Status 05/29/2020 FINAL  Final   Organism ID, Bacteria PROTEUS MIRABILIS (A)  Final      Susceptibility   Proteus mirabilis - MIC*    AMPICILLIN <=2 SENSITIVE Sensitive     CEFAZOLIN <=4 SENSITIVE Sensitive     CEFTRIAXONE <=0.25 SENSITIVE Sensitive     CIPROFLOXACIN >=4 RESISTANT Resistant     GENTAMICIN <=1 SENSITIVE Sensitive     IMIPENEM 2 SENSITIVE Sensitive  NITROFURANTOIN 128 RESISTANT Resistant     TRIMETH/SULFA <=20 SENSITIVE Sensitive     AMPICILLIN/SULBACTAM <=2 SENSITIVE Sensitive     PIP/TAZO <=4 SENSITIVE Sensitive     * >=100,000 COLONIES/mL PROTEUS MIRABILIS  Blood Culture (routine x 2)     Status: None (Preliminary result)   Collection Time: 05/27/20 10:12 AM   Specimen: BLOOD  Result Value Ref Range Status   Specimen Description BLOOD RIGHT ANTECUBITAL  Final   Special Requests   Final    BOTTLES DRAWN AEROBIC AND  ANAEROBIC Blood Culture results may not be optimal due to an inadequate volume of blood received in culture bottles   Culture   Final    NO GROWTH 3 DAYS Performed at [facility], [place] [address], [place], [place] [address]    Report Status PENDING  Incomplete  Blood Culture (routine x 2)     Status: None (Preliminary result)   Collection Time: 05/27/20 10:30 AM   Specimen: BLOOD RIGHT HAND  Result Value Ref Range Status   Specimen Description BLOOD RIGHT HAND  Final   Special Requests   Final    BOTTLES DRAWN AEROBIC ONLY Blood Culture results may not be optimal due to an inadequate volume of blood received in culture bottles   Culture   Final    NO GROWTH 3 DAYS Performed at [facility], [place] [address] [place], [place] [address]    Report Status PENDING  Incomplete  SARS Coronavirus 2 by RT PCR (hospital order, performed in [facility] hospital lab) Nasopharyngeal Nasopharyngeal Swab     Status: None   Collection Time: 05/27/20 11:18 AM   Specimen: Nasopharyngeal Swab  Result Value Ref Range Status   SARS Coronavirus 2 NEGATIVE NEGATIVE Final    Comment: (NOTE) SARS-CoV-2 target nucleic acids are NOT DETECTED.  The SARS-CoV-2 RNA is generally detectable in upper and lower respiratory specimens during the acute phase of infection. The lowest concentration of SARS-CoV-2 viral copies this assay can detect is 250 copies / mL. A negative result does not preclude SARS-CoV-2 infection and should not be used as the sole basis for treatment or other patient management decisions.  A negative result may occur with improper specimen collection / handling, submission of specimen other than nasopharyngeal swab, presence of viral mutation(s) within the areas targeted by this assay, and inadequate number of viral copies (<250 copies / mL). A negative result must be combined with clinical observations, patient history, and epidemiological information.  Fact Sheet for Patients:    StrictlyIdeas.no  Fact Sheet for Healthcare Providers: BankingDealers.co.za  This test is not yet approved or  cleared by the [place] FDA and has been authorized for detection and/or diagnosis of SARS-CoV-2 by FDA under an Emergency Use Authorization (EUA).  This EUA will remain in effect (meaning this test can be used) for the duration of the COVID-19 declaration under Section 564(b)(1) of the Act, 21 U.S.C. section 360bbb-3(b)(1), unless the authorization is terminated or revoked sooner.  Performed at [facility], [place] [address], [place], [place] [address]   MRSA PCR Screening     Status: None   Collection Time: 05/27/20  8:45 PM   Specimen: Nasal Mucosa; Nasopharyngeal  Result Value Ref Range Status   MRSA by PCR NEGATIVE NEGATIVE Final    Comment:        The GeneXpert MRSA Assay (FDA approved for NASAL specimens only), is one component of a comprehensive MRSA colonization surveillance program. It  is not intended to diagnose MRSA infection nor to guide or monitor treatment for MRSA infections. Performed at [facility], [place] [address] [place], [place] [address]     Impression/Plan:  1. Fever - she has remained afebrile.  The overall picture remains unclear of what is causing the recurrent episodes.  Differential is broad but no real abnormalities to lead in any particular direction.  She has been evaluated extensively with labs, scans.   Will check an ANA, though not typical for SLE. Will check a SPEP  I discussed a bone marrow biopsy with the patient and Dr. Broadus John and agree with proceeding with this.  For the bone marrow, would send for the usual cells and fungal and AFB smears. Also, would send for AFB and fungal cultures and a sample to the [facility] for PCR studies (will coordinate with pathology when they have the sample).    2.  Sob - she is breathing comfortably now and will continue to  monitor.

## 2020-05-30 NOTE — Progress Notes (Addendum)
Referring Physician(s): Comer,R  Supervising Physician: Arne Cleveland  Patient Status:  [facility] - In-pt  Chief Complaint:  Fever, dyspnea  Subjective: Patient familiar to IR service from left arm fistulogram in 2013.  She has a history of polycystic kidney disease with prior transplant in 2013- on chronic chemo immunosuppression therapy, COPD, chronic respiratory failure with hypoxia on 2 L of oxygen per minute at baseline, CHF, pulmonary hypertension, anemia, sacral ulcer, Proteus UTI.  She was recently admitted with recurrent SIRS-like symptoms with intermittent fevers, chest tightness, dyspnea, tachypnea, weight loss.  She is COVID-19 negative.  Blood cultures negative to date.  Chest x-ray with no acute disease.  Negative nuclear medicine perfusion lung scan.  She currently denies fever, headache, worsening dyspnea, cough, abdominal/back pain, nausea, vomiting or bleeding.  She has been evaluated by infectious disease but etiology of fever unclear at this point.  Request now received for CT-guided bone marrow biopsy to include cultures for AFB and fungus.   Past Medical History:  Diagnosis Date   Arthritis    Asthma    CHF (congestive heart failure) (HCC)    COPD (chronic obstructive pulmonary disease) ([facility])    Depression    Essential hypertension 02/08/2020   GERD (gastroesophageal reflux disease)    Hyperlipidemia    Hyperparathyroidism    Polycystic kidney    PONV (postoperative nausea and vomiting)    Past Surgical History:  Procedure Laterality Date   AV FISTULA PLACEMENT  10-21-2010   left Brachiocephalic AVF   BILATERAL OOPHORECTOMY     BIOPSY  05/19/2020   Procedure: BIOPSY;  Surgeon: Carol Ada, MD;  Location: [facility];  Service: Endoscopy;;   CARPAL TUNNEL RELEASE  2000   CESAREAN SECTION     COLONOSCOPY WITH PROPOFOL N/A 05/19/2020   Procedure: COLONOSCOPY WITH PROPOFOL;  Surgeon: Carol Ada, MD;  Location: [facility];  Service:  Endoscopy;  Laterality: N/A;   ENTEROSCOPY N/A 05/19/2020   Procedure: ENTEROSCOPY;  Surgeon: Carol Ada, MD;  Location: [facility];  Service: Endoscopy;  Laterality: N/A;   INSERTION OF DIALYSIS CATHETER  03/31/2012   Procedure: INSERTION OF DIALYSIS CATHETER;  Surgeon: Angelia Mould, MD;  Location: [facility];  Service: Vascular;  Laterality: N/A;  insertion of dialysis catheter right internal jugular vein   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   ORIF TIBIA FRACTURE Left 12/23/2018   Procedure: OPEN REDUCTION INTERNAL FIXATION (ORIF) TIBIA FRACTURE;  Surgeon: Shona Needles, MD;  Location: [facility];  Service: Orthopedics;  Laterality: Left;   ORIF TIBIA PLATEAU Right 12/23/2018   Procedure: OPEN REDUCTION INTERNAL FIXATION (ORIF) TIBIAL PLATEAU;  Surgeon: Shona Needles, MD;  Location: [facility];  Service: Orthopedics;  Laterality: Right;   ORIF WRIST FRACTURE Left 12/08/2019   Procedure: OPEN REDUCTION INTERNAL FIXATION (ORIF) WRIST FRACTURE, REPAIR LACERATION LEFT FOREARM;  Surgeon: Dorna Leitz, MD;  Location: [facility];  Service: Orthopedics;  Laterality: Left;   TONSILLECTOMY  1967   TOTAL HIP ARTHROPLASTY Left 12/08/2019   Procedure: TOTAL HIP ARTHROPLASTY ANTERIOR APPROACH;  Surgeon: Dorna Leitz, MD;  Location: [facility];  Service: Orthopedics;  Laterality: Left;   TUBAL LIGATION  2010     Allergies: Infed [iron dextran], Pentamidine, Erythromycin [erythromycin], Iohexol, Oxycodone, Erythromycin, and Ultram [tramadol hcl]  Medications: Prior to Admission medications   Medication Sig Start Date End Date Taking? Authorizing Provider  acetaminophen (TYLENOL) 325 MG tablet Take 2 tablets (650 mg total) by mouth every 6 (six) hours as needed for  mild pain (or Fever >/= 101). 05/22/20  Yes Elgergawy, Silver Huguenin, MD  allopurinol (ZYLOPRIM) 300 MG tablet Take 1 tablet (300 mg total) by mouth daily. 01/04/20  Yes Angiulli, Lavon Paganini, PA-C  ascorbic acid (VITAMIN C) 500 MG tablet Take 500 mg by  mouth daily.   Yes [provider]  aspirin 81 MG chewable tablet Chew 81 mg by mouth daily.   Yes [provider]  brimonidine (ALPHAGAN P) 0.1 % SOLN Place 1 drop into both eyes 2 (two) times daily.    Yes [provider]  budesonide (PULMICORT) 1 MG/2ML nebulizer solution Take 1 mg by nebulization in the morning and at bedtime.   Yes [provider]  cholecalciferol (VITAMIN D3) 25 MCG (1000 UNIT) tablet Take 1 tablet (1,000 Units total) by mouth daily. 01/04/20  Yes Angiulli, Lavon Paganini, PA-C  collagenase (SANTYL) ointment Apply 1 application topically daily. Sacral wound   Yes [provider]  cyanocobalamin 1000 MCG tablet Take 1,000 mcg by mouth daily.   Yes [provider]  cycloSPORINE (RESTASIS) 0.05 % ophthalmic emulsion Place 1 drop into both eyes 2 (two) times daily. 01/04/20  Yes Angiulli, Lavon Paganini, PA-C  dextromethorphan-guaiFENesin (MUCINEX DM) 30-600 MG 12hr tablet Take 1 tablet by mouth 2 (two) times daily as needed for cough. 04/19/20  Yes Amin, Ankit Chirag, MD  dorzolamide (TRUSOPT) 2 % ophthalmic solution Place 1 drop into both eyes daily. 06/25/19  Yes [provider]  ferrous sulfate 325 (65 FE) MG tablet Take 325 mg by mouth daily with breakfast.   Yes [provider]  FLUoxetine (PROZAC) 40 MG capsule Take 1 capsule (40 mg total) by mouth daily. 01/04/20  Yes Angiulli, Lavon Paganini, PA-C  furosemide (LASIX) 40 MG tablet Take 1 tablet (40 mg total) by mouth daily. 02/07/20  Yes Sheikh, Omair Latif, DO  gabapentin (NEURONTIN) 100 MG capsule Take 200 mg by mouth 3 (three) times daily.   Yes [provider]  guaiFENesin (MUCINEX) 600 MG 12 hr tablet Take 1 tablet (600 mg total) by mouth 2 (two) times daily. 02/06/20  Yes Sheikh, Omair Latif, DO  insulin aspart (NOVOLOG) 100 UNIT/ML injection Inject 2-12 Units into the skin See admin instructions. Per sliding scale subcutaneous, once a day - If blood sugar is: 0 -  200: 0 units   201 - 250: 2 units   251 - 300: 4 units   301 - 350: 6 units   351 - 400: 8 units   401 - 450: 10 units   451 - 500: 12 units   500 or greater: Call MD and give 14 units, then recheck blood sugar in 2 hours, at that time if blood sugar is greater than 400 or less than 80, call PEC TRIAGE.   Yes [provider]  ipratropium (ATROVENT HFA) 17 MCG/ACT inhaler Inhale 1 puff into the lungs every 6 (six) hours. For COPD   Yes [provider]  isosorbide mononitrate (IMDUR) 60 MG 24 hr tablet Take 1 tablet (60 mg total) by mouth daily. 05/23/20  Yes Elgergawy, Silver Huguenin, MD  levalbuterol (XOPENEX) 0.63 MG/3ML nebulizer solution Take 0.63 mg by nebulization See admin instructions. Inhale 0.38m once daily.  May take 0.674mevery six hours as needed COPD.   Yes [provider]  magnesium oxide (MAG-OX) 400 MG tablet Take 1 tablet (400 mg total) by mouth 2 (two) times daily. 01/04/20  Yes Angiulli, DaLavon PaganiniPA-C  Multiple Vitamin (MULTIVITAMIN WITH  MINERALS) TABS tablet Take 1 tablet by mouth daily. 02/24/20  Yes Alma Friendly, MD  mycophenolate (MYFORTIC) 180 MG EC tablet Take 180 mg by mouth 2 (two) times daily.   Yes [provider]  pantoprazole (PROTONIX) 40 MG tablet Take 1 tablet (40 mg total) by mouth 2 (two) times daily. 01/04/20  Yes Angiulli, Lavon Paganini, PA-C  predniSONE (DELTASONE) 2.5 MG tablet Take 2.5 mg by mouth daily with breakfast. Takes with 61m tablet for total dose of 7.5 mg   Yes [provider]  predniSONE (DELTASONE) 5 MG tablet Take 1.5 tablets (7.5 mg total) by mouth daily with breakfast. Patient taking differently: Take 5 mg by mouth daily with breakfast. Take with the 2.557mtablet for a total dose of 7.18m39m/3/21  Yes Elgergawy, DawSilver HugueninD  PROGRAF 1 MG capsule Take 1 mg by mouth daily. 03/22/19  Yes [provider]  sennosides-docusate sodium (SENOKOT-S) 8.6-50 MG tablet Take 2 tablets by mouth daily.    Yes [provider]  tacrolimus (PROGRAF) 0.5 MG capsule Take 0.5 mg by mouth at bedtime. Takes brand name Prograf   Yes [provider]  TRAVATAN Z 0.004 % SOLN ophthalmic solution Place 1 drop into both eyes at bedtime. 07/03/16  Yes [provider]  valACYclovir (VALTREX) 500 MG tablet Take 500 mg by mouth 2 (two) times daily.   Yes [provider]  verapamil (CALAN-SR) 240 MG CR tablet Take 240 mg by mouth daily.   Yes [provider]  albuterol (VENTOLIN HFA) 108 (90 Base) MCG/ACT inhaler Inhale 2 puffs into the lungs every 4 (four) hours as needed for wheezing or shortness of breath. Patient not taking: Reported on 05/27/2020 01/04/20   Angiulli, DanLavon PaganiniA-C  budesonide (PULMICORT) 0.5 MG/2ML nebulizer solution Take 2 mLs (0.5 mg total) by nebulization 2 (two) times daily. Patient not taking: Reported on 05/27/2020 04/19/20   AmiDamita LackD  docusate sodium (COLACE) 100 MG capsule Take 1 capsule (100 mg total) by mouth 2 (two) times daily. Patient not taking: Reported on 05/27/2020 12/13/19   AdhShelly CossD  feeding supplement, ENSURE ENLIVE, (ENSURE ENLIVE) LIQD Take 237 mLs by mouth 2 (two) times daily between meals. Patient not taking: Reported on 05/27/2020 02/23/20   EzeAlma FriendlyD  SYMBICORT 160-4.5 MCG/ACT inhaler Inhale 2 puffs into the lungs 2 (two) times daily. Patient not taking: Reported on 05/27/2020 01/04/20   Angiulli, DanLavon PaganiniA-C  verapamil (CALAN-SR) 180 MG CR tablet Take 1 tablet (180 mg total) by mouth daily. Patient not taking: Reported on 05/27/2020 05/23/20   Elgergawy, DawSilver HugueninD     Vital Signs: BP 126/83 (BP Location: Right Arm)    Pulse 86    Temp 97.8 F (36.6 C)    Resp 19    Ht 5' 1"  (1.549 m)    Wt 167 lb 1.7 oz (75.8 kg)    SpO2 100%    BMI 31.57 kg/m   Physical Exam awake, alert.  Chest with distant breath sounds bilaterally.  Heart with regular rate and rhythm.  Abdomen soft, positive bowel sounds, nontender.  No  significant lower extremity edema.  Imaging: DG Chest Port 1 View  Result Date: 05/27/2020 CLINICAL DATA:  Chest pain and shortness of breath EXAM: PORTABLE CHEST 1 VIEW COMPARISON:  May 16, 2020 FINDINGS: The heart size and mediastinal contours are stable. The heart size is enlarged. Both lungs are clear. The visualized skeletal structures are  unremarkable. IMPRESSION: No active cardiopulmonary disease. Electronically Signed   By: Abelardo Diesel M.D.   On: 05/27/2020 11:05    Labs:  CBC: Recent Labs    05/27/20 1020 05/27/20 1020 05/27/20 1045 05/28/20 0548 05/29/20 0805 05/30/20 0824  WBC 17.1*  --   --  14.4* 13.0* 11.0*  HGB 9.4*   < > 10.5* 9.1* 8.6* 8.6*  HCT 31.3*   < > 31.0* 30.7* 28.6* 29.1*  PLT 233  --   --  162 182 176   < > = values in this interval not displayed.    COAGS: Recent Labs    04/16/20 0608 04/29/20 1343 05/13/20 1148 05/27/20 1020  INR 1.4* 1.2 1.2 1.2  APTT 40* 28 36 36    BMP: Recent Labs    05/20/20 0316 05/22/20 0509 05/27/20 1020 05/27/20 1045 05/28/20 0548 05/30/20 0824  NA 137  --  132* 133* 137 136  K 4.5   < > 4.1 4.2 4.7 4.4  CL 99  --  97*  --  104 106  CO2 29  --  26  --  22 20*  GLUCOSE 219*  --  136*  --  188* 166*  BUN 53*  --  27*  --  32* 53*  CALCIUM 9.7  --  9.2  --  9.4 9.7  CREATININE 1.02*  --  1.02*  --  0.93 1.22*  GFRNONAA 58*  --  58*  --  >60 47*  GFRAA >60  --  >60  --  >60 55*   < > = values in this interval not displayed.    LIVER FUNCTION TESTS: Recent Labs    05/17/20 1021 05/18/20 0223 05/27/20 1020 05/30/20 0824  BILITOT 0.9 0.8 1.6* 0.7  AST 17 22 20 16   ALT 14 18 17 16   ALKPHOS 61 58 78 63  PROT 6.8 6.2* 7.2 6.5  ALBUMIN 2.4* 2.4* 2.7* 2.4*    Assessment and Plan:  64 yo female with history of polycystic kidney disease with prior renal transplant in 2013- on chronic chemo immunosuppression therapy, COPD, chronic respiratory failure with hypoxia on 2 L of oxygen per minute at  baseline, CHF, pulmonary hypertension, anemia, sacral ulcer, Proteus UTI.  She was recently admitted with recurrent SIRS-like symptoms with intermittent fevers, chest tightness, dyspnea, tachypnea, weight loss.  She is COVID-19 negative.  Blood cultures negative to date.  Chest x-ray with no acute disease.  Negative nuclear medicine perfusion lung scan.  She currently denies fever, headache, worsening dyspnea, cough, abdominal/back pain, nausea, vomiting or bleeding.  She has been evaluated by infectious disease but etiology of recurrent fevers unclear at this point.  Request now received for CT-guided bone marrow biopsy to include cultures for AFB and fungus. Risks and benefits of procedure was discussed with the patient and/or patient's family including, but not limited to bleeding, infection, damage to adjacent structures or low yield requiring additional tests.  All of the questions were answered and there is agreement to proceed.  Consent signed and in chart.   Procedure tentatively scheduled for tomorrow morning.   Electronically Signed: D. Rowe Robert, PA-C 05/30/2020, 3:22 PM   I spent a total of 20 minutes at the the patient's bedside AND on the patient's hospital floor or unit, greater than 50% of which was counseling/coordinating care for CT guided bone marrow biopsy    Patient ID: JYSSICA RIEF, female   DOB: [birth year], 64 y.o.   MRN: [id number]

## 2020-05-31 ENCOUNTER — Inpatient Hospital Stay (HOSPITAL_COMMUNITY): Payer: Medicare Other

## 2020-05-31 ENCOUNTER — Other Ambulatory Visit: Payer: Self-pay

## 2020-05-31 ENCOUNTER — Encounter (HOSPITAL_COMMUNITY): Payer: Self-pay | Admitting: Internal Medicine

## 2020-05-31 LAB — CBC WITH DIFFERENTIAL/PLATELET
Abs Immature Granulocytes: 0.03 10*3/uL (ref 0.00–0.07)
Basophils Absolute: 0 10*3/uL (ref 0.0–0.1)
Basophils Relative: 0 %
Eosinophils Absolute: 0 10*3/uL (ref 0.0–0.5)
Eosinophils Relative: 0 %
HCT: 28.9 % — ABNORMAL LOW (ref 36.0–46.0)
Hemoglobin: 8.5 g/dL — ABNORMAL LOW (ref 12.0–15.0)
Immature Granulocytes: 1 %
Lymphocytes Relative: 17 %
Lymphs Abs: 0.9 10*3/uL (ref 0.7–4.0)
MCH: 31.1 pg (ref 26.0–34.0)
MCHC: 29.4 g/dL — ABNORMAL LOW (ref 30.0–36.0)
MCV: 105.9 fL — ABNORMAL HIGH (ref 80.0–100.0)
Monocytes Absolute: 0.2 10*3/uL (ref 0.1–1.0)
Monocytes Relative: 4 %
Neutro Abs: 4.1 10*3/uL (ref 1.7–7.7)
Neutrophils Relative %: 78 %
Platelets: 152 10*3/uL (ref 150–400)
RBC: 2.73 MIL/uL — ABNORMAL LOW (ref 3.87–5.11)
RDW: 20.3 % — ABNORMAL HIGH (ref 11.5–15.5)
WBC: 5.3 10*3/uL (ref 4.0–10.5)
nRBC: 0 % (ref 0.0–0.2)

## 2020-05-31 LAB — COMPREHENSIVE METABOLIC PANEL
ALT: 16 U/L (ref 0–44)
AST: 16 U/L (ref 15–41)
Albumin: 2.3 g/dL — ABNORMAL LOW (ref 3.5–5.0)
Alkaline Phosphatase: 63 U/L (ref 38–126)
Anion gap: 6 (ref 5–15)
BUN: 53 mg/dL — ABNORMAL HIGH (ref 8–23)
CO2: 24 mmol/L (ref 22–32)
Calcium: 9.8 mg/dL (ref 8.9–10.3)
Chloride: 109 mmol/L (ref 98–111)
Creatinine, Ser: 1.15 mg/dL — ABNORMAL HIGH (ref 0.44–1.00)
GFR calc Af Amer: 59 mL/min — ABNORMAL LOW (ref >60)
GFR calc non Af Amer: 51 mL/min — ABNORMAL LOW (ref >60)
Glucose, Bld: 122 mg/dL — ABNORMAL HIGH (ref 70–99)
Potassium: 4.8 mmol/L (ref 3.5–5.1)
Sodium: 139 mmol/L (ref 135–145)
Total Bilirubin: 0.7 mg/dL (ref 0.3–1.2)
Total Protein: 6.2 g/dL — ABNORMAL LOW (ref 6.5–8.1)

## 2020-05-31 LAB — GLUCOSE, CAPILLARY
Glucose-Capillary: 124 mg/dL — ABNORMAL HIGH (ref 70–99)
Glucose-Capillary: 98 mg/dL (ref 70–99)
Glucose-Capillary: 152 mg/dL — ABNORMAL HIGH (ref 70–99)
Glucose-Capillary: 124 mg/dL — ABNORMAL HIGH (ref 70–99)

## 2020-05-31 LAB — PATHOLOGIST SMEAR REVIEW

## 2020-05-31 LAB — C-REACTIVE PROTEIN: CRP: 1.3 mg/dL — ABNORMAL HIGH (ref <1.0)

## 2020-05-31 LAB — PROCALCITONIN: Procalcitonin: 0.22 ng/mL

## 2020-05-31 LAB — SEDIMENTATION RATE: Sed Rate: 61 mm/hr — ABNORMAL HIGH (ref 0–22)

## 2020-05-31 MED ORDER — MIDAZOLAM HCL 2 MG/2ML IJ SOLN
INTRAMUSCULAR | Status: AC
  Filled 2020-05-31: qty 2

## 2020-05-31 MED ORDER — FENTANYL CITRATE (PF) 100 MCG/2ML IJ SOLN
INTRAMUSCULAR | Status: AC | PRN
  Administered 2020-05-31 (×2): 25 ug via INTRAVENOUS

## 2020-05-31 MED ORDER — MIDAZOLAM HCL 2 MG/2ML IJ SOLN
INTRAMUSCULAR | Status: AC | PRN
  Administered 2020-05-31: 1 mg via INTRAVENOUS
  Administered 2020-05-31: 0.5 mg via INTRAVENOUS

## 2020-05-31 MED ORDER — FENTANYL CITRATE (PF) 100 MCG/2ML IJ SOLN
INTRAMUSCULAR | Status: AC
  Filled 2020-05-31: qty 2

## 2020-05-31 MED ORDER — LIDOCAINE HCL 1 % IJ SOLN
INTRAMUSCULAR | Status: AC
  Filled 2020-05-31: qty 20

## 2020-05-31 NOTE — Progress Notes (Signed)
PROGRESS NOTE    April [name]  EPP:[id number] DOB: 31-Oct-1956 DOA: 05/27/2020 PCP: [name], MD   Brief Narrative:  Patient is a 64 year old female with history of polycystic disease status post transplant on chronic chemo immunosuppression therapy, COPD, chronic respiratory failure with hypoxia on 2 L of oxygen per minute at baseline, diastolic CHF, pulmonary hypertension who presents with shortness of breath.  She was just discharged from ER after being hospitalized from 6/21-6/30 for the management of sepsis.  She was found to have wheezing some chest discomfort, she denied cough.  On presentation she was febrile, tachycardic, tachypneic, had leukocytosis.  Chest x-ray did not show any acute abnormalities.   Blood cultures were obtained.  She was started on antibiotics, IV fluids.  Urine culture growing Proteus  Assessment & Plan:  Sepsis like symptoms:  Fever of unknown origin Presented yet again with fever-temp of 103, tachycardia, tachypnea -Chest x-ray without acute findings -UTI growing Proteus but no symptoms of UTI -Patient has a very small chronic sacral ulcer without any surrounding signs or symptoms of infection,, recent MRI on 6/23 was negative for abscess, osteomyelitis, recent CT chest/abd pelvis negative too, dopplers negative -Frequent hospitalizations for same, this is the 11th admission for similar symptoms in 47months, each time with fever, sepsis type symptoms with negative work-up  -Immunocompromised state puts her at risk of opportunistic infections,, work-up unrevealing thus far -Appreciate ID input, monitor off antibiotics and on home dose of prednisone -Remained stable for 36 hours now -Peripheral smear pending -Bone marrow aspiration biopsy as part of work-up for FUO today   COPD -Chronic respiratory failure on 2 L home O2 at baseline -No wheezing, I think her tachypnea was in the setting of fever -Chest x-ray unremarkable, continue nebs prn  Chronic  diastolic congestive heart failure:  -last echocardiogram showed ejection fraction of 60%, grade 2 diastolic dysfunction.  -Clinically euvolemic  Hyponatremia:  -Improved, hydrated with saline  History of SVT:  - Continue verapamil 240 mg daily.  Hyperglycemia:  - Last hemoglobin A1c of 5.9 as per 04/29/2020.  Monitor CBGs.  History of kidney transplant:  -On chronic immunosuppressive therapy, continue home regimen  Anemia of chronic disease: Currently H&H stable.  History with CKD  History of pulmonary hypertension:: On supplemental oxygen at baseline.  Sacral ulcer: Has a stage II pressure ulcer on the sacrum.  Present on admission.  MRI done on 6/1 3 did not show any signs of abscess or osteomyelitis.  Frequent turning. Ulcer does not look infected.  Debility/deconditioning: Was discharged to rehab( [facility]) on her last admission.  Has significant debility/deconditioning. -PT following  DVT prophylaxis: Lovenox Code Status: Full Family Communication: Discussed with patient in detail, no family at bedside Status is: Inpatient  Remains inpatient appropriate because: Ongoing work-up of FUO leading to 11 hospitalizations in 6 months without clear diagnosis   Dispo: The patient is from: SNF              Anticipated d/c is to: SNF              Anticipated d/c date is: 4-5days              Patient currently is not medically stable to d/c.  Consultants: ID  Procedures: Bone marrow aspiration biopsy today 7/9  Antimicrobials:  Anti-infectives (From admission, onward)   Start     Dose/Rate Route Frequency Ordered Stop   05/27/20 2200  ceFEPIme (MAXIPIME) 2 g in sodium chloride 0.9 % 100 mL  IVPB  Status:  Discontinued        2 g 200 mL/hr over 30 Minutes Intravenous  Once 05/27/20 1530 05/27/20 1542   05/27/20 2200  ceFEPIme (MAXIPIME) 2 g in sodium chloride 0.9 % 100 mL IVPB  Status:  Discontinued        2 g 200 mL/hr over 30 Minutes Intravenous Every 12 hours 05/27/20 1542  05/29/20 1520   05/27/20 1600  valACYclovir (VALTREX) tablet 500 mg     Discontinue     500 mg Oral 2 times daily 05/27/20 1528     05/27/20 1030  ceFEPIme (MAXIPIME) 2 g in sodium chloride 0.9 % 100 mL IVPB        2 g 200 mL/hr over 30 Minutes Intravenous  Once 05/27/20 1025 05/27/20 1305   05/27/20 1030  vancomycin (VANCOREADY) IVPB 1500 mg/300 mL        1,500 mg 150 mL/hr over 120 Minutes Intravenous  Once 05/27/20 1025 05/27/20 1523   05/27/20 1015  cefTRIAXone (ROCEPHIN) 1 g in sodium chloride 0.9 % 100 mL IVPB  Status:  Discontinued        1 g 200 mL/hr over 30 Minutes Intravenous  Once 05/27/20 1012 05/27/20 1024   05/27/20 1015  azithromycin (ZITHROMAX) 500 mg in sodium chloride 0.9 % 250 mL IVPB  Status:  Discontinued        500 mg 250 mL/hr over 60 Minutes Intravenous  Once 05/27/20 1012 05/27/20 1024      Subjective: -A little tired but otherwise okay, no specific symptoms  Objective: Vitals:   05/31/20 0935 05/31/20 1005 05/31/20 1111 05/31/20 1130  BP:  (!) 143/79 (!) 147/75 133/72  Pulse: 88 79 81 75  Resp: 16 18 16 18   Temp:  97.6 F (36.4 C) 97.6 F (36.4 C) (!) 97.5 F (36.4 C)  TempSrc:  Oral Oral Oral  SpO2: 100% 100% 100% 100%  Weight:      Height:        Intake/Output Summary (Last 24 hours) at 05/31/2020 1147 Last data filed at 05/31/2020 0900 Gross per 24 hour  Intake 995 ml  Output 1300 ml  Net -305 ml   Filed Weights   05/27/20 1013 05/30/20 0443 05/30/20 2104  Weight: 72 kg 75.8 kg 75.8 kg    Examination:  General exam: Chronically ill pleasant female sitting up in bed, AAOx3, no distress HEENT: No JVD CVS: S1-S2, regular rate rhythm Lungs: Poor air movement bilaterally otherwise clear Abdomen: Soft, nontender, bowel sounds present Extremities: No edema, ecchymosis in both upper arms Skin: Numerous skin breakdowns, scattered bruises, stage II sacral ulcer present on admission    Data Reviewed: I have personally reviewed following labs  and imaging studies  CBC: Recent Labs  Lab 05/27/20 1020 05/27/20 1020 05/27/20 1045 05/28/20 0548 05/29/20 0805 05/30/20 0824 05/31/20 0615  WBC 17.1*  --   --  14.4* 13.0* 11.0* 5.3  NEUTROABS 14.9*  --   --   --  12.2*  --  4.1  HGB 9.4*   < > 10.5* 9.1* 8.6* 8.6* 8.5*  HCT 31.3*   < > 31.0* 30.7* 28.6* 29.1* 28.9*  MCV 104.0*  --   --  103.4* 102.9* 103.2* 105.9*  PLT 233  --   --  162 182 176 152   < > = values in this interval not displayed.   Basic Metabolic Panel: Recent Labs  Lab 05/27/20 1020 05/27/20 1045 05/28/20 0548 05/30/20 9924 05/31/20 2683  NA 132* 133* 137 136 139  K 4.1 4.2 4.7 4.4 4.8  CL 97*  --  104 106 109  CO2 26  --  22 20* 24  GLUCOSE 136*  --  188* 166* 122*  BUN 27*  --  32* 53* 53*  CREATININE 1.02*  --  0.93 1.22* 1.15*  CALCIUM 9.2  --  9.4 9.7 9.8  MG  --   --  2.2  --   --    GFR: Estimated Creatinine Clearance: 46.6 mL/min (A) (by C-G formula based on SCr of 1.15 mg/dL (H)). Liver Function Tests: Recent Labs  Lab 05/27/20 1020 05/30/20 0824 05/31/20 0615  AST 20 16 16   ALT 17 16 16   ALKPHOS 78 63 63  BILITOT 1.6* 0.7 0.7  PROT 7.2 6.5 6.2*  ALBUMIN 2.7* 2.4* 2.3*   No results for input(s): LIPASE, AMYLASE in the last 168 hours. No results for input(s): AMMONIA in the last 168 hours. Coagulation Profile: Recent Labs  Lab 05/27/20 1020  INR 1.2   Cardiac Enzymes: No results for input(s): CKTOTAL, CKMB, CKMBINDEX, TROPONINI in the last 168 hours. BNP (last 3 results) No results for input(s): PROBNP in the last 8760 hours. HbA1C: No results for input(s): HGBA1C in the last 72 hours. CBG: Recent Labs  Lab 05/30/20 1146 05/30/20 1616 05/30/20 2104 05/31/20 0645 05/31/20 1131  GLUCAP 134* 178* 118* 124* 98   Lipid Profile: No results for input(s): CHOL, HDL, LDLCALC, TRIG, CHOLHDL, LDLDIRECT in the last 72 hours. Thyroid Function Tests: No results for input(s): TSH, T4TOTAL, FREET4, T3FREE, THYROIDAB in the  last 72 hours. Anemia Panel: No results for input(s): VITAMINB12, FOLATE, FERRITIN, TIBC, IRON, RETICCTPCT in the last 72 hours. Sepsis Labs: Recent Labs  Lab 05/27/20 1020 05/31/20 0615  PROCALCITON  --  0.22  LATICACIDVEN 1.1  --     Recent Results (from the past 240 hour(s))  Urine culture     Status: Abnormal   Collection Time: 05/27/20 10:07 AM   Specimen: In/Out Cath Urine  Result Value Ref Range Status   Specimen Description IN/OUT CATH URINE  Final   Special Requests   Final    NONE Performed at [facility], [address], [place], [place] [address]    Culture >=100,000 COLONIES/mL PROTEUS MIRABILIS (A)  Final   Report Status 05/29/2020 FINAL  Final   Organism ID, Bacteria PROTEUS MIRABILIS (A)  Final      Susceptibility   Proteus mirabilis - MIC*    AMPICILLIN <=2 SENSITIVE Sensitive     CEFAZOLIN <=4 SENSITIVE Sensitive     CEFTRIAXONE <=0.25 SENSITIVE Sensitive     CIPROFLOXACIN >=4 RESISTANT Resistant     GENTAMICIN <=1 SENSITIVE Sensitive     IMIPENEM 2 SENSITIVE Sensitive     NITROFURANTOIN 128 RESISTANT Resistant     TRIMETH/SULFA <=20 SENSITIVE Sensitive     AMPICILLIN/SULBACTAM <=2 SENSITIVE Sensitive     PIP/TAZO <=4 SENSITIVE Sensitive     * >=100,000 COLONIES/mL PROTEUS MIRABILIS  Blood Culture (routine x 2)     Status: None (Preliminary result)   Collection Time: 05/27/20 10:12 AM   Specimen: BLOOD  Result Value Ref Range Status   Specimen Description BLOOD RIGHT ANTECUBITAL  Final   Special Requests   Final    BOTTLES DRAWN AEROBIC AND ANAEROBIC Blood Culture results may not be optimal due to an inadequate volume of blood received in culture bottles   Culture   Final  NO GROWTH 3 DAYS Performed at [facility], [place] [address] [place], [place] [address]    Report Status PENDING  Incomplete  Blood Culture (routine x 2)     Status: None (Preliminary result)   Collection Time: 05/27/20 10:30 AM   Specimen: BLOOD RIGHT HAND   Result Value Ref Range Status   Specimen Description BLOOD RIGHT HAND  Final   Special Requests   Final    BOTTLES DRAWN AEROBIC ONLY Blood Culture results may not be optimal due to an inadequate volume of blood received in culture bottles   Culture   Final    NO GROWTH 3 DAYS Performed at [facility], [place] [address] [place], [place] [address]    Report Status PENDING  Incomplete  SARS Coronavirus 2 by RT PCR (hospital order, performed in [facility] hospital lab) Nasopharyngeal Nasopharyngeal Swab     Status: None   Collection Time: 05/27/20 11:18 AM   Specimen: Nasopharyngeal Swab  Result Value Ref Range Status   SARS Coronavirus 2 NEGATIVE NEGATIVE Final    Comment: (NOTE) SARS-CoV-2 target nucleic acids are NOT DETECTED.  The SARS-CoV-2 RNA is generally detectable in upper and lower respiratory specimens during the acute phase of infection. The lowest concentration of SARS-CoV-2 viral copies this assay can detect is 250 copies / mL. A negative result does not preclude SARS-CoV-2 infection and should not be used as the sole basis for treatment or other patient management decisions.  A negative result may occur with improper specimen collection / handling, submission of specimen other than nasopharyngeal swab, presence of viral mutation(s) within the areas targeted by this assay, and inadequate number of viral copies (<250 copies / mL). A negative result must be combined with clinical observations, patient history, and epidemiological information.  Fact Sheet for Patients:   StrictlyIdeas.no  Fact Sheet for Healthcare Providers: BankingDealers.co.za  This test is not yet approved or  cleared by the [place] FDA and has been authorized for detection and/or diagnosis of SARS-CoV-2 by FDA under an Emergency Use Authorization (EUA).  This EUA will remain in effect (meaning this test can be used) for the duration of  the COVID-19 declaration under Section 564(b)(1) of the Act, 21 U.S.C. section 360bbb-3(b)(1), unless the authorization is terminated or revoked sooner.  Performed at [facility], [place] [address] [place], [place] [address]   MRSA PCR Screening     Status: None   Collection Time: 05/27/20  8:45 PM   Specimen: Nasal Mucosa; Nasopharyngeal  Result Value Ref Range Status   MRSA by PCR NEGATIVE NEGATIVE Final    Comment:        The GeneXpert MRSA Assay (FDA approved for NASAL specimens only), is one component of a comprehensive MRSA colonization surveillance program. It is not intended to diagnose MRSA infection nor to guide or monitor treatment for MRSA infections. Performed at [facility], [place] [address] [place], [place] [address]          Radiology Studies: No results found.      Scheduled Meds: . allopurinol  300 mg Oral Daily  . aspirin  81 mg Oral Daily  . brimonidine  1 drop Both Eyes BID  . budesonide  1 mg Nebulization BID  . collagenase  1 application Topical Daily  . cycloSPORINE  1 drop Both Eyes BID  . dorzolamide  1 drop Both Eyes Daily  . enoxaparin (LOVENOX) injection  40 mg Subcutaneous Q24H  .  fentaNYL      . ferrous sulfate  325 mg Oral Q breakfast  . FLUoxetine  40 mg Oral Daily  . gabapentin  200 mg Oral TID  . guaiFENesin  600 mg Oral BID  . insulin aspart  0-5 Units Subcutaneous QHS  . insulin aspart  0-9 Units Subcutaneous TID WC  . isosorbide mononitrate  60 mg Oral Daily  . latanoprost  1 drop Both Eyes QHS  . levalbuterol  0.63 mg Nebulization BID  . lidocaine      . magnesium oxide  400 mg Oral BID  . midazolam      . multivitamin with minerals  1 tablet Oral Daily  . mycophenolate  180 mg Oral BID  . pantoprazole  40 mg Oral BID  . predniSONE  7.5 mg Oral Q breakfast  . senna-docusate  2 tablet Oral Daily  . sodium chloride flush  3 mL Intravenous Q12H  . tacrolimus  0.5 mg Oral QHS  . tacrolimus  1 mg Oral  Daily  . valACYclovir  500 mg Oral BID  . verapamil  240 mg Oral Daily  . cyanocobalamin  1,000 mcg Oral Daily   Continuous Infusions:    LOS: 4 days    Time spent: 36min  Domenic Polite, MD [facility] P7/07/2020, 11:47 AM

## 2020-05-31 NOTE — Progress Notes (Signed)
OT Cancellation Note  Patient Details Name: April [name] MRN: [id number] DOB: 07/20/1956   Cancelled Treatment:    Reason Eval/Treat Not Completed: Active bedrest order--2 hours post bone marrow biopsy (order put in 11:07)  Cathy Leonard, OTR/L Acute Rehab Services Pager [phone] Office [phone]     Leonard, Catherine Eva 05/31/2020, 11:26 AM 

## 2020-05-31 NOTE — Procedures (Signed)
  Procedure: CT bone marrow biopsy R iliac EBL:   minimal Complications:  none immediate  See full dictation in [facility] PACS.  D. Praveen Coia MD Main # [phone] Pager  [phone]    

## 2020-05-31 NOTE — Progress Notes (Signed)
Occupational Therapy Treatment Patient Details Name: April [name] MRN: [id number] DOB: Sep 30, 1956 Today's Date: 05/31/2020    History of present illness April [name] is a 64 y.o. female with medical history significant of polycystic kidney disease s/p renal transplant on chronic immunosuppressive therapy, COPD, chronic respiratory failure with hypoxia on 2 L of nasal cannula oxygen at baseline, diastolic CHF, and pulmonary hypertension.  She presents with complaints of progressively worsening shortness of breath over the last 2 days.  She just recently been hospitalized from 6/21-6/30 with sepsis of origin.   OT comments  This 64 yo female admitted with above presents to acute OT today willing to work (even though she had had a bone marrow biopsy earlier today). She is overall weak when up on her feet but did good with short distance of ambulation, standing to wash her face, and using her arms to fix her hair up into a ponytail. Pt will continue to benefit from acute OT with follow up at SNF.  Follow Up Recommendations  SNF;Supervision/Assistance - 24 hour    Equipment Recommendations  Other (comment) (TBD next venue)       Precautions / Restrictions Precautions Precautions: Fall Restrictions Weight Bearing Restrictions: No       Mobility Bed Mobility Overal bed mobility: Needs Assistance Bed Mobility: Supine to Sit;Sit to Supine     Supine to sit: Supervision Sit to supine: Supervision   General bed mobility comments: Pt sitting EOB upon my entry with dtr present  Transfers Overall transfer level: Needs assistance Equipment used: Rolling walker (2 wheeled) Transfers: Sit to/from Stand Sit to Stand: Min guard            Balance Overall balance assessment: Needs assistance Sitting-balance support: No upper extremity supported;Feet supported Sitting balance-Leahy Scale: Good     Standing balance support: Single extremity supported;During functional  activity Standing balance-Leahy Scale: Poor Standing balance comment: standing to wash face                           ADL either performed or assessed with clinical judgement   ADL Overall ADL's : Needs assistance/impaired     Grooming: Wash/dry face;Min guard;Standing Grooming Details (indicate cue type and reason): Pt also able to sit EOB, take ponytail down, brush her hair, and put back up in ponytail               Lower Body Dressing Details (indicate cue type and reason): Pt still able to cross her legs to get to her feet Toilet Transfer: Minimal assistance;RW Toilet Transfer Details (indicate cue type and reason): Bed>sink>bed                 Vision Baseline Vision/History: Wears glasses Wears Glasses: At all times Patient Visual Report: No change from baseline            Cognition Arousal/Alertness: Awake/alert Behavior During Therapy: WFL for tasks assessed/performed Overall Cognitive Status: Within Functional Limits for tasks assessed                                                     Pertinent Vitals/ Pain       Pain Assessment: No/denies pain         Frequency  Min 2X/week  Progress Toward Goals  OT Goals(current goals can now be found in the care plan section)  Progress towards OT goals: Progressing toward goals     Plan Discharge plan remains appropriate       AM-PAC OT "6 Clicks" Daily Activity     Outcome Measure   Help from another person eating meals?: None Help from another person taking care of personal grooming?: A Little Help from another person toileting, which includes using toliet, bedpan, or urinal?: A Little Help from another person bathing (including washing, rinsing, drying)?: A Little Help from another person to put on and taking off regular upper body clothing?: A Little Help from another person to put on and taking off regular lower body clothing?: A Little 6 Click Score:  19    End of Session Equipment Utilized During Treatment: Gait belt;Rolling walker  OT Visit Diagnosis: Muscle weakness (generalized) (M62.81);History of falling (Z91.81);Unsteadiness on feet (R26.81)   Activity Tolerance Patient limited by fatigue (had had a procedure eariler today)   Patient Left with family/visitor present (sitting EOB)   Nurse Communication          Time: 0970-4492 OT Time Calculation (min): 22 min  Charges: OT General Charges $OT Visit: 1 Visit OT Treatments $Self Care/Home Management : 8-22 mins  [place] Circle, OTR/L Acute [employer] Pager [phone] Office [phone]      Almon Register 05/31/2020, 5:41 PM

## 2020-05-31 NOTE — Plan of Care (Signed)
  Problem: Fluid Volume: Goal: Hemodynamic stability will improve Outcome: Progressing   

## 2020-05-31 NOTE — Progress Notes (Signed)
PT Cancellation Note  Patient Details Name: April [name] MRN: [id number] DOB: [birth year]   Cancelled Treatment:    Reason Eval/Treat Not Completed: Patient at procedure or test/unavailable. Pt currently being transported to IR for bone marrow biopsy. PT will continue to f/u with pt acutely as available.    [place] 05/31/2020, 8:00 AM

## 2020-05-31 NOTE — Progress Notes (Signed)
Physical Therapy Treatment Patient Details Name: April [name] MRN: [id number] DOB: 1955/12/18 Today's Date: 05/31/2020    History of Present Illness April [name] is a 64 y.o. female with medical history significant of polycystic kidney disease s/p renal transplant on chronic immunosuppressive therapy, COPD, chronic respiratory failure with hypoxia on 2 L of nasal cannula oxygen at baseline, diastolic CHF, and pulmonary hypertension.  She presents with complaints of progressively worsening shortness of breath over the last 2 days.  She just recently been hospitalized from 6/21-6/30 with sepsis of origin.    PT Comments    Pt much more limited this session secondary to fatigue and lethargy after having her biopsy this morning. However, still agreeable to participate in therapy session. She tolerated bed mobility, transfers and generalized LE strengthening therex. Pt's SpO2 remains WNL on RA throughout. Pt would continue to benefit from skilled physical therapy services at this time while admitted and after d/c to address the below listed limitations in order to improve overall safety and independence with functional mobility.    Follow Up Recommendations  SNF;Supervision/Assistance - 24 hour     Equipment Recommendations  None recommended by PT    Recommendations for Other Services       Precautions / Restrictions Precautions Precautions: Fall Restrictions Weight Bearing Restrictions: No    Mobility  Bed Mobility Overal bed mobility: Needs Assistance Bed Mobility: Supine to Sit;Sit to Supine     Supine to sit: Supervision Sit to supine: Supervision   General bed mobility comments: use of bed rail, supervision for safety  Transfers Overall transfer level: Needs assistance Equipment used: Rolling walker (2 wheeled) Transfers: Sit to/from Stand Sit to Stand: Min guard         General transfer comment: pt performed from EOB x5, min guard for safety, good technique  utilized  Ambulation/Gait             General Gait Details: deferred secondary to pt being very lethargic and weak   Marine scientist [place] (Stroke Patients Only)       Balance Overall balance assessment: Needs assistance Sitting-balance support: No upper extremity supported;Feet supported Sitting balance-Leahy Scale: Good     Standing balance support: Bilateral upper extremity supported;During functional activity Standing balance-Leahy Scale: Poor Standing balance comment: reliant on RW                            Cognition Arousal/Alertness: Lethargic;Suspect due to medications Behavior During Therapy: [facility] for tasks assessed/performed Overall Cognitive Status: Within Functional Limits for tasks assessed                                        Exercises General Exercises - Lower Extremity Ankle Circles/Pumps: AROM;Both;10 reps;Seated Long Arc Quad: AROM;Strengthening;Both;10 reps;Seated Hip ABduction/ADduction: Standing;AROM;Strengthening Hip Flexion/Marching: Standing;AROM;Strengthening;Both;10 reps Heel Raises: AROM;Both;15 reps;Seated Mini-Sqauts: AROM;10 reps;Standing Other Exercises Other Exercises: pt performed 5x sit<>stand with RW and min guard for safety; very limited secondary to fatigue    General Comments        Pertinent Vitals/Pain Pain Assessment: No/denies pain    Home Living                      Prior Function  PT Goals (current goals can now be found in the care plan section) Acute Rehab PT Goals PT Goal Formulation: With patient Time For Goal Achievement: 06/11/20 Potential to Achieve Goals: Fair Progress towards PT goals: Progressing toward goals    Frequency    Min 3X/week      PT Plan Current plan remains appropriate    Co-evaluation              AM-PAC PT "6 Clicks" Mobility   Outcome Measure  Help needed turning from  your back to your side while in a flat bed without using bedrails?: None Help needed moving from lying on your back to sitting on the side of a flat bed without using bedrails?: None Help needed moving to and from a bed to a chair (including a wheelchair)?: A Little Help needed standing up from a chair using your arms (e.g., wheelchair or bedside chair)?: A Little Help needed to walk in hospital room?: A Little Help needed climbing 3-5 steps with a railing? : A Lot 6 Click Score: 19    End of Session   Activity Tolerance: Patient limited by fatigue;Patient limited by lethargy Patient left: in bed;with call bell/phone within reach;with bed alarm set Nurse Communication: Mobility status PT Visit Diagnosis: Muscle weakness (generalized) (M62.81);Other abnormalities of gait and mobility (R26.89)     Time: 5051-8335 PT Time Calculation (min) (ACUTE ONLY): 25 min  Charges:  $Therapeutic Exercise: 8-22 mins $Therapeutic Activity: 8-22 mins                     Anastasio Champion, DPT  Acute Rehabilitation Services Pager [phone] Office [place] 05/31/2020, 3:04 PM

## 2020-06-01 LAB — CBC WITH DIFFERENTIAL/PLATELET
Abs Immature Granulocytes: 0.05 10*3/uL (ref 0.00–0.07)
Basophils Absolute: 0 10*3/uL (ref 0.0–0.1)
Basophils Relative: 0 %
Eosinophils Absolute: 0 10*3/uL (ref 0.0–0.5)
Eosinophils Relative: 1 %
HCT: 30.8 % — ABNORMAL LOW (ref 36.0–46.0)
Hemoglobin: 9.1 g/dL — ABNORMAL LOW (ref 12.0–15.0)
Immature Granulocytes: 1 %
Lymphocytes Relative: 20 %
Lymphs Abs: 1 10*3/uL (ref 0.7–4.0)
MCH: 30.8 pg (ref 26.0–34.0)
MCHC: 29.5 g/dL — ABNORMAL LOW (ref 30.0–36.0)
MCV: 104.4 fL — ABNORMAL HIGH (ref 80.0–100.0)
Monocytes Absolute: 0.2 10*3/uL (ref 0.1–1.0)
Monocytes Relative: 4 %
Neutro Abs: 3.7 10*3/uL (ref 1.7–7.7)
Neutrophils Relative %: 74 %
Platelets: 140 10*3/uL — ABNORMAL LOW (ref 150–400)
RBC: 2.95 MIL/uL — ABNORMAL LOW (ref 3.87–5.11)
RDW: 20 % — ABNORMAL HIGH (ref 11.5–15.5)
WBC: 5 10*3/uL (ref 4.0–10.5)
nRBC: 0 % (ref 0.0–0.2)

## 2020-06-01 LAB — BASIC METABOLIC PANEL
Anion gap: 7 (ref 5–15)
BUN: 46 mg/dL — ABNORMAL HIGH (ref 8–23)
CO2: 25 mmol/L (ref 22–32)
Calcium: 9.9 mg/dL (ref 8.9–10.3)
Chloride: 108 mmol/L (ref 98–111)
Creatinine, Ser: 1.01 mg/dL — ABNORMAL HIGH (ref 0.44–1.00)
GFR calc Af Amer: >60 mL/min (ref >60)
GFR calc non Af Amer: 59 mL/min — ABNORMAL LOW (ref >60)
Glucose, Bld: 107 mg/dL — ABNORMAL HIGH (ref 70–99)
Potassium: 4.6 mmol/L (ref 3.5–5.1)
Sodium: 140 mmol/L (ref 135–145)

## 2020-06-01 LAB — GLUCOSE, CAPILLARY
Glucose-Capillary: 81 mg/dL (ref 70–99)
Glucose-Capillary: 82 mg/dL (ref 70–99)
Glucose-Capillary: 111 mg/dL — ABNORMAL HIGH (ref 70–99)
Glucose-Capillary: 223 mg/dL — ABNORMAL HIGH (ref 70–99)
Glucose-Capillary: 123 mg/dL — ABNORMAL HIGH (ref 70–99)

## 2020-06-01 LAB — CULTURE, BLOOD (ROUTINE X 2)
Culture: NO GROWTH
Culture: NO GROWTH

## 2020-06-01 LAB — ACID FAST SMEAR (AFB, MYCOBACTERIA): Acid Fast Smear: NEGATIVE

## 2020-06-01 MED ORDER — DEXTROSE 50 % IV SOLN
INTRAVENOUS | Status: AC
  Filled 2020-06-01: qty 50

## 2020-06-01 NOTE — Plan of Care (Signed)
  Problem: Education: Goal: Knowledge of General Education information will improve Description Including pain rating scale, medication(s)/side effects and non-pharmacologic comfort measures Outcome: Progressing   

## 2020-06-01 NOTE — Progress Notes (Signed)
PROGRESS NOTE    April [name]  CVE:[id number] DOB: 1956-10-27 DOA: 05/27/2020 PCP: [name], MD   Brief Narrative:  Patient is a 64 year old female with history of polycystic disease status post transplant on chronic chemo immunosuppression therapy, COPD, chronic respiratory failure with hypoxia on 2 L of oxygen per minute at baseline, diastolic CHF, pulmonary hypertension who presents with shortness of breath.  She was just discharged from ER after being hospitalized from 6/21-6/30 for the management of sepsis.  She was found to have wheezing some chest discomfort, she denied cough.  On presentation she was febrile, tachycardic, tachypneic, had leukocytosis.  Chest x-ray did not show any acute abnormalities.   Blood cultures were obtained.  She was started on antibiotics, IV fluids.  Urine culture growing Proteus  Assessment & Plan:  Sepsis like symptoms:  Fever of unknown origin Admitted yet again with fever-temp of 103, tachycardia, tachypnea -Chest x-ray without acute findings -UTI growing Proteus but no symptoms of UTI -Patient has a very small chronic sacral ulcer without any surrounding signs or symptoms of infection,, recent MRI on 6/23 was negative for abscess, osteomyelitis, recent CT chest/abd pelvis negative too, dopplers negative -Frequent hospitalizations for same, this is the 11th admission for similar symptoms in 49month, each time with fever, sepsis type symptoms with negative work-up  -Immunocompromised state puts her at risk of opportunistic infections,, work-up unrevealing thus far -Appreciate ID input, remained stable for 48 hours off antibiotics and on home dose of prednisone  -Peripheral smear unremarkable -Bone marrow aspiration biopsy yesterday as part of FUO work-up  COPD -Chronic respiratory failure on 2 L home O2 at baseline -No wheezing, I think her tachypnea was in the setting of fever -Chest x-ray unremarkable, continue nebs prn  Chronic diastolic  congestive heart failure:  -last echocardiogram showed ejection fraction of 502% grade 2 diastolic dysfunction.  -Clinically euvolemic  Hyponatremia:  -Improved, hydrated with saline  History of SVT:  - Continue verapamil 240 mg daily.  Hyperglycemia:  - Last hemoglobin A1c of 5.9 as per 04/29/2020.  Monitor CBGs.  History of kidney transplant:  -On chronic immunosuppressive therapy, continue home regimen  Anemia of chronic disease: Currently H&H stable.  History with CKD  History of pulmonary hypertension:: On supplemental oxygen at baseline.  Sacral ulcer: Has a stage II pressure ulcer on the sacrum.  Present on admission.  MRI done on 6/1 3 did not show any signs of abscess or osteomyelitis.  Frequent turning. Ulcer does not look infected.  Debility/deconditioning: Was discharged to rehab( [facility]) on her last admission.  Has significant debility/deconditioning. -PT following  DVT prophylaxis: Lovenox Code Status: Full Family Communication: Discussed with patient in detail, no family at bedside Status is: Inpatient  Remains inpatient appropriate because: Ongoing work-up of FUO leading to 11 hospitalizations in 6 months without clear diagnosis   Dispo: The patient is from: SNF              Anticipated d/c is to: SNF              Anticipated d/c date is: Early next week              Patient currently is not medically stable to d/c.  Consultants: ID  Procedures: Bone marrow aspiration biopsy 7/9  Antimicrobials:  Anti-infectives (From admission, onward)   Start     Dose/Rate Route Frequency Ordered Stop   05/27/20 2200  ceFEPIme (MAXIPIME) 2 g in sodium chloride 0.9 % 100 mL IVPB  Status:  Discontinued        2 g 200 mL/hr over 30 Minutes Intravenous  Once 05/27/20 1530 05/27/20 1542   05/27/20 2200  ceFEPIme (MAXIPIME) 2 g in sodium chloride 0.9 % 100 mL IVPB  Status:  Discontinued        2 g 200 mL/hr over 30 Minutes Intravenous Every 12 hours 05/27/20 1542 05/29/20  1520   05/27/20 1600  valACYclovir (VALTREX) tablet 500 mg     Discontinue     500 mg Oral 2 times daily 05/27/20 1528     05/27/20 1030  ceFEPIme (MAXIPIME) 2 g in sodium chloride 0.9 % 100 mL IVPB        2 g 200 mL/hr over 30 Minutes Intravenous  Once 05/27/20 1025 05/27/20 1305   05/27/20 1030  vancomycin (VANCOREADY) IVPB 1500 mg/300 mL        1,500 mg 150 mL/hr over 120 Minutes Intravenous  Once 05/27/20 1025 05/27/20 1523   05/27/20 1015  cefTRIAXone (ROCEPHIN) 1 g in sodium chloride 0.9 % 100 mL IVPB  Status:  Discontinued        1 g 200 mL/hr over 30 Minutes Intravenous  Once 05/27/20 1012 05/27/20 1024   05/27/20 1015  azithromycin (ZITHROMAX) 500 mg in sodium chloride 0.9 % 250 mL IVPB  Status:  Discontinued        500 mg 250 mL/hr over 60 Minutes Intravenous  Once 05/27/20 1012 05/27/20 1024      Subjective: -Feels okay, no specific complaints, remains afebrile, no respiratory issues  Objective: Vitals:   05/31/20 2035 05/31/20 2135 06/01/20 0550 06/01/20 0906  BP:  (!) 130/58 (!) 155/67 (!) 157/68  Pulse: 83 77 91 95  Resp: 18 17 17 18   Temp:  98.5 F (36.9 C) 98.1 F (36.7 C)   TempSrc:  Oral Oral   SpO2: 93% 93% 95% 95%  Weight:      Height:        Intake/Output Summary (Last 24 hours) at 06/01/2020 1057 Last data filed at 06/01/2020 0930 Gross per 24 hour  Intake 560 ml  Output 2200 ml  Net -1640 ml   Filed Weights   05/27/20 1013 05/30/20 0443 05/30/20 2104  Weight: 72 kg 75.8 kg 75.8 kg    Examination:  General exam: Chronically ill pleasant female sitting up in bed, AAOx3, no distress HEENT: No JVD CVS: S1-S2, regular rate rhythm Lungs: Poor air movement bilaterally, otherwise clear Abdomen: Soft, nontender, bowel sounds present Extremities: No edema, ecchymosis in both upper arms Skin: Numerous skin breakdowns, scattered bruises, stage II sacral ulcer present on admission    Data Reviewed: I have personally reviewed following labs and  imaging studies  CBC: Recent Labs  Lab 05/27/20 1020 05/27/20 1045 05/28/20 0548 05/29/20 0805 05/30/20 0824 05/31/20 0615 06/01/20 0723  WBC 17.1*   < > 14.4* 13.0* 11.0* 5.3 5.0  NEUTROABS 14.9*  --   --  12.2*  --  4.1 3.7  HGB 9.4*   < > 9.1* 8.6* 8.6* 8.5* 9.1*  HCT 31.3*   < > 30.7* 28.6* 29.1* 28.9* 30.8*  MCV 104.0*   < > 103.4* 102.9* 103.2* 105.9* 104.4*  PLT 233   < > 162 182 176 152 140*   < > = values in this interval not displayed.   Basic Metabolic Panel: Recent Labs  Lab 05/27/20 1020 05/27/20 1020 05/27/20 1045 05/28/20 0548 05/30/20 0824 05/31/20 0615 06/01/20 0723  NA 132*   < >  133* 137 136 139 140  K 4.1   < > 4.2 4.7 4.4 4.8 4.6  CL 97*  --   --  104 106 109 108  CO2 26  --   --  22 20* 24 25  GLUCOSE 136*  --   --  188* 166* 122* 107*  BUN 27*  --   --  32* 53* 53* 46*  CREATININE 1.02*  --   --  0.93 1.22* 1.15* 1.01*  CALCIUM 9.2  --   --  9.4 9.7 9.8 9.9  MG  --   --   --  2.2  --   --   --    < > = values in this interval not displayed.   GFR: Estimated Creatinine Clearance: 53.1 mL/min (A) (by C-G formula based on SCr of 1.01 mg/dL (H)). Liver Function Tests: Recent Labs  Lab 05/27/20 1020 05/30/20 0824 05/31/20 0615  AST 20 16 16   ALT 17 16 16   ALKPHOS 78 63 63  BILITOT 1.6* 0.7 0.7  PROT 7.2 6.5 6.2*  ALBUMIN 2.7* 2.4* 2.3*   No results for input(s): LIPASE, AMYLASE in the last 168 hours. No results for input(s): AMMONIA in the last 168 hours. Coagulation Profile: Recent Labs  Lab 05/27/20 1020  INR 1.2   Cardiac Enzymes: No results for input(s): CKTOTAL, CKMB, CKMBINDEX, TROPONINI in the last 168 hours. BNP (last 3 results) No results for input(s): PROBNP in the last 8760 hours. HbA1C: No results for input(s): HGBA1C in the last 72 hours. CBG: Recent Labs  Lab 05/31/20 1703 05/31/20 2135 06/01/20 0654 06/01/20 0702 06/01/20 0711  GLUCAP 152* 124* 33* 81 82   Lipid Profile: No results for input(s): CHOL,  HDL, LDLCALC, TRIG, CHOLHDL, LDLDIRECT in the last 72 hours. Thyroid Function Tests: No results for input(s): TSH, T4TOTAL, FREET4, T3FREE, THYROIDAB in the last 72 hours. Anemia Panel: No results for input(s): VITAMINB12, FOLATE, FERRITIN, TIBC, IRON, RETICCTPCT in the last 72 hours. Sepsis Labs: Recent Labs  Lab 05/27/20 1020 05/31/20 0615  PROCALCITON  --  0.22  LATICACIDVEN 1.1  --     Recent Results (from the past 240 hour(s))  Urine culture     Status: Abnormal   Collection Time: 05/27/20 10:07 AM   Specimen: In/Out Cath Urine  Result Value Ref Range Status   Specimen Description IN/OUT CATH URINE  Final   Special Requests   Final    NONE Performed at [facility], [address] [place], [place] [address]    Culture >=100,000 COLONIES/mL PROTEUS MIRABILIS (A)  Final   Report Status 05/29/2020 FINAL  Final   Organism ID, Bacteria PROTEUS MIRABILIS (A)  Final      Susceptibility   Proteus mirabilis - MIC*    AMPICILLIN <=2 SENSITIVE Sensitive     CEFAZOLIN <=4 SENSITIVE Sensitive     CEFTRIAXONE <=0.25 SENSITIVE Sensitive     CIPROFLOXACIN >=4 RESISTANT Resistant     GENTAMICIN <=1 SENSITIVE Sensitive     IMIPENEM 2 SENSITIVE Sensitive     NITROFURANTOIN 128 RESISTANT Resistant     TRIMETH/SULFA <=20 SENSITIVE Sensitive     AMPICILLIN/SULBACTAM <=2 SENSITIVE Sensitive     PIP/TAZO <=4 SENSITIVE Sensitive     * >=100,000 COLONIES/mL PROTEUS MIRABILIS  Blood Culture (routine x 2)     Status: None (Preliminary result)   Collection Time: 05/27/20 10:12 AM   Specimen: BLOOD  Result Value Ref Range Status   Specimen Description BLOOD RIGHT  ANTECUBITAL  Final   Special Requests   Final    BOTTLES DRAWN AEROBIC AND ANAEROBIC Blood Culture results may not be optimal due to an inadequate volume of blood received in culture bottles   Culture   Final    NO GROWTH 4 DAYS Performed at [facility], [place] [address], [place], [place] [address]    Report Status  PENDING  Incomplete  Blood Culture (routine x 2)     Status: None (Preliminary result)   Collection Time: 05/27/20 10:30 AM   Specimen: BLOOD RIGHT HAND  Result Value Ref Range Status   Specimen Description BLOOD RIGHT HAND  Final   Special Requests   Final    BOTTLES DRAWN AEROBIC ONLY Blood Culture results may not be optimal due to an inadequate volume of blood received in culture bottles   Culture   Final    NO GROWTH 4 DAYS Performed at [facility], [place] [address] [place], [place] [address]    Report Status PENDING  Incomplete  SARS Coronavirus 2 by RT PCR (hospital order, performed in [facility] hospital lab) Nasopharyngeal Nasopharyngeal Swab     Status: None   Collection Time: 05/27/20 11:18 AM   Specimen: Nasopharyngeal Swab  Result Value Ref Range Status   SARS Coronavirus 2 NEGATIVE NEGATIVE Final    Comment: (NOTE) SARS-CoV-2 target nucleic acids are NOT DETECTED.  The SARS-CoV-2 RNA is generally detectable in upper and lower respiratory specimens during the acute phase of infection. The lowest concentration of SARS-CoV-2 viral copies this assay can detect is 250 copies / mL. A negative result does not preclude SARS-CoV-2 infection and should not be used as the sole basis for treatment or other patient management decisions.  A negative result may occur with improper specimen collection / handling, submission of specimen other than nasopharyngeal swab, presence of viral mutation(s) within the areas targeted by this assay, and inadequate number of viral copies (<250 copies / mL). A negative result must be combined with clinical observations, patient history, and epidemiological information.  Fact Sheet for Patients:   StrictlyIdeas.no  Fact Sheet for Healthcare Providers: BankingDealers.co.za  This test is not yet approved or  cleared by the [place] FDA and has been authorized for detection and/or diagnosis  of SARS-CoV-2 by FDA under an Emergency Use Authorization (EUA).  This EUA will remain in effect (meaning this test can be used) for the duration of the COVID-19 declaration under Section 564(b)(1) of the Act, 21 U.S.C. section 360bbb-3(b)(1), unless the authorization is terminated or revoked sooner.  Performed at [facility], [place] [address] [place], [place] [address]   MRSA PCR Screening     Status: None   Collection Time: 05/27/20  8:45 PM   Specimen: Nasal Mucosa; Nasopharyngeal  Result Value Ref Range Status   MRSA by PCR NEGATIVE NEGATIVE Final    Comment:        The GeneXpert MRSA Assay (FDA approved for NASAL specimens only), is one component of a comprehensive MRSA colonization surveillance program. It is not intended to diagnose MRSA infection nor to guide or monitor treatment for MRSA infections. Performed at [facility], [place] [address] [place], [place] [address]   Fungus culture, blood     Status: None (Preliminary result)   Collection Time: 05/30/20 10:28 AM   Specimen: BLOOD RIGHT HAND  Result Value Ref Range Status   Specimen Description BLOOD RIGHT HAND  Final   Special Requests  Final    BOTTLES DRAWN AEROBIC AND ANAEROBIC Blood Culture adequate volume   Culture   Final    NO GROWTH 1 DAY Performed at [facility], [place] [address] [place], [place] [address]    Report Status PENDING  Incomplete  Acid Fast Smear (AFB)     Status: None   Collection Time: 05/31/20  9:24 AM   Specimen: Bone Biopsy; Tissue  Result Value Ref Range Status   AFB Specimen Processing Comment  Final    Comment: Tissue Grinding and Digestion/Decontamination   Acid Fast Smear Negative  Final    Comment: (NOTE) Performed At: [facility] [place], [place] [id number] Rush Farmer MD WL:[id number]    Source (AFB) BONE MARROW  Final    Comment: Performed at [facility], [place] [address] [place], [place] [address]          Radiology Studies: CT BONE MARROW BIOPSY  Result Date: 05/31/2020 CLINICAL DATA:  Renal failure.  Fever of unknown origin. EXAM: CT GUIDED DEEP ILIAC BONE ASPIRATION AND CORE BIOPSY TECHNIQUE: Patient was placed prone on the CT gantry and limited axial scans through the pelvis were obtained. Appropriate skin entry site was identified. Skin site was marked, prepped with chlorhexidine, draped in usual sterile fashion, and infiltrated locally with 1% lidocaine. Intravenous Fentanyl 60mg and Versed 1.595mwere administered as conscious sedation during continuous monitoring of the patient's level of consciousness and physiological / cardiorespiratory status by the radiology RN, with a total moderate sedation time of 13 minutes. Under CT fluoroscopic guidance an 11-gauge Cook trocar bone needle was advanced into the right iliac bone just lateral to the sacroiliac joint. Once needle tip position was confirmed, core and aspiration samples were obtained, submitted to pathology for approval. Additional core was sent for requested microbiologic analysis. Patient tolerated procedure well. COMPLICATIONS: COMPLICATIONS none IMPRESSION: 1. Technically successful CT guided right iliac bone core and aspiration biopsy. Electronically Signed   By: D Lucrezia Europe.D.   On: 05/31/2020 12:22        Scheduled Meds: . allopurinol  300 mg Oral Daily  . aspirin  81 mg Oral Daily  . brimonidine  1 drop Both Eyes BID  . budesonide  1 mg Nebulization BID  . collagenase  1 application Topical Daily  . cycloSPORINE  1 drop Both Eyes BID  . dorzolamide  1 drop Both Eyes Daily  . enoxaparin (LOVENOX) injection  40 mg Subcutaneous Q24H  . ferrous sulfate  325 mg Oral Q breakfast  . FLUoxetine  40 mg Oral Daily  . gabapentin  200 mg Oral TID  . guaiFENesin  600 mg Oral BID  . insulin aspart  0-5 Units Subcutaneous QHS  . insulin aspart  0-9 Units Subcutaneous TID WC  . isosorbide mononitrate  60 mg Oral Daily  .  latanoprost  1 drop Both Eyes QHS  . levalbuterol  0.63 mg Nebulization BID  . magnesium oxide  400 mg Oral BID  . multivitamin with minerals  1 tablet Oral Daily  . mycophenolate  180 mg Oral BID  . pantoprazole  40 mg Oral BID  . predniSONE  7.5 mg Oral Q breakfast  . senna-docusate  2 tablet Oral Daily  . sodium chloride flush  3 mL Intravenous Q12H  . tacrolimus  0.5 mg Oral QHS  . tacrolimus  1 mg Oral Daily  . valACYclovir  500 mg Oral BID  . verapamil  240 mg Oral Daily  .  cyanocobalamin  1,000 mcg Oral Daily   Continuous Infusions:    LOS: 5 days    Time spent: 68mn  PDomenic Polite MD [facility] P7/08/2020, 10:57 AM

## 2020-06-01 NOTE — Plan of Care (Signed)
  Problem: Clinical Measurements: Goal: Ability to maintain clinical measurements within normal limits will improve Outcome: Progressing   

## 2020-06-02 DIAGNOSIS — R509 Fever, unspecified: Principal | ICD-10-CM

## 2020-06-02 LAB — GLUCOSE, CAPILLARY
Glucose-Capillary: 102 mg/dL — ABNORMAL HIGH (ref 70–99)
Glucose-Capillary: 115 mg/dL — ABNORMAL HIGH (ref 70–99)
Glucose-Capillary: 157 mg/dL — ABNORMAL HIGH (ref 70–99)
Glucose-Capillary: 86 mg/dL (ref 70–99)

## 2020-06-02 LAB — CBC
HCT: 28.7 % — ABNORMAL LOW (ref 36.0–46.0)
Hemoglobin: 8.5 g/dL — ABNORMAL LOW (ref 12.0–15.0)
MCH: 30.7 pg (ref 26.0–34.0)
MCHC: 29.6 g/dL — ABNORMAL LOW (ref 30.0–36.0)
MCV: 103.6 fL — ABNORMAL HIGH (ref 80.0–100.0)
Platelets: 135 10*3/uL — ABNORMAL LOW (ref 150–400)
RBC: 2.77 MIL/uL — ABNORMAL LOW (ref 3.87–5.11)
RDW: 20 % — ABNORMAL HIGH (ref 11.5–15.5)
WBC: 6 10*3/uL (ref 4.0–10.5)
nRBC: 0 % (ref 0.0–0.2)

## 2020-06-02 LAB — BASIC METABOLIC PANEL
Anion gap: 9 (ref 5–15)
BUN: 36 mg/dL — ABNORMAL HIGH (ref 8–23)
CO2: 23 mmol/L (ref 22–32)
Calcium: 9.8 mg/dL (ref 8.9–10.3)
Chloride: 111 mmol/L (ref 98–111)
Creatinine, Ser: 0.9 mg/dL (ref 0.44–1.00)
GFR calc Af Amer: >60 mL/min (ref >60)
GFR calc non Af Amer: >60 mL/min (ref >60)
Glucose, Bld: 115 mg/dL — ABNORMAL HIGH (ref 70–99)
Potassium: 4.4 mmol/L (ref 3.5–5.1)
Sodium: 143 mmol/L (ref 135–145)

## 2020-06-02 LAB — ANA W/REFLEX IF POSITIVE: Anti Nuclear Antibody (ANA): NEGATIVE

## 2020-06-02 NOTE — Progress Notes (Signed)
Patient seen and examined, remains stable and afebrile today -Follow-up bone marrow biopsy preliminary info tomorrow -Off IV antibiotics and on home dose of prednisone  April Polite, MD

## 2020-06-02 NOTE — Plan of Care (Signed)
  Problem: Education: Goal: Knowledge of General Education information will improve Description Including pain rating scale, medication(s)/side effects and non-pharmacologic comfort measures Outcome: Progressing   

## 2020-06-03 ENCOUNTER — Inpatient Hospital Stay: Payer: Medicare Other | Admitting: Pulmonary Disease

## 2020-06-03 LAB — GLUCOSE, CAPILLARY
Glucose-Capillary: 33 mg/dL — CL (ref 70–99)
Glucose-Capillary: 112 mg/dL — ABNORMAL HIGH (ref 70–99)
Glucose-Capillary: 124 mg/dL — ABNORMAL HIGH (ref 70–99)
Glucose-Capillary: 110 mg/dL — ABNORMAL HIGH (ref 70–99)
Glucose-Capillary: 99 mg/dL (ref 70–99)

## 2020-06-03 LAB — BASIC METABOLIC PANEL
Anion gap: 7 (ref 5–15)
BUN: 29 mg/dL — ABNORMAL HIGH (ref 8–23)
CO2: 25 mmol/L (ref 22–32)
Calcium: 9.4 mg/dL (ref 8.9–10.3)
Chloride: 106 mmol/L (ref 98–111)
Creatinine, Ser: 1.05 mg/dL — ABNORMAL HIGH (ref 0.44–1.00)
GFR calc Af Amer: >60 mL/min (ref >60)
GFR calc non Af Amer: 56 mL/min — ABNORMAL LOW (ref >60)
Glucose, Bld: 123 mg/dL — ABNORMAL HIGH (ref 70–99)
Potassium: 4.4 mmol/L (ref 3.5–5.1)
Sodium: 138 mmol/L (ref 135–145)

## 2020-06-03 LAB — CBC
HCT: 31.5 % — ABNORMAL LOW (ref 36.0–46.0)
Hemoglobin: 9.7 g/dL — ABNORMAL LOW (ref 12.0–15.0)
MCH: 32 pg (ref 26.0–34.0)
MCHC: 30.8 g/dL (ref 30.0–36.0)
MCV: 104 fL — ABNORMAL HIGH (ref 80.0–100.0)
Platelets: 155 10*3/uL (ref 150–400)
RBC: 3.03 MIL/uL — ABNORMAL LOW (ref 3.87–5.11)
RDW: 21.1 % — ABNORMAL HIGH (ref 11.5–15.5)
WBC: 8.2 10*3/uL (ref 4.0–10.5)
nRBC: 0 % (ref 0.0–0.2)

## 2020-06-03 LAB — PROTEIN ELECTROPHORESIS, SERUM
A/G Ratio: 0.9 (ref 0.7–1.7)
Albumin ELP: 2.8 g/dL — ABNORMAL LOW (ref 2.9–4.4)
Alpha-1-Globulin: 0.3 g/dL (ref 0.0–0.4)
Alpha-2-Globulin: 0.7 g/dL (ref 0.4–1.0)
Beta Globulin: 0.8 g/dL (ref 0.7–1.3)
Gamma Globulin: 1.5 g/dL (ref 0.4–1.8)
Globulin, Total: 3.2 g/dL (ref 2.2–3.9)
M-Spike, %: 1.2 g/dL — ABNORMAL HIGH
Total Protein ELP: 6 g/dL (ref 6.0–8.5)

## 2020-06-03 LAB — SARS CORONAVIRUS 2 (TAT 6-24 HRS): SARS Coronavirus 2: NEGATIVE

## 2020-06-03 NOTE — Progress Notes (Signed)
[facility] for Infectious Disease   Reason for visit: Follow up on fever  Interval History: no fever, no new symptoms.  No sob.  No chills.     Physical Exam: Constitutional:  Vitals:   06/03/20 0758 06/03/20 1015  BP:  122/61  Pulse: 98 (!) 105  Resp: 14 18  Temp:  98.1 F (36.7 C)  SpO2: 93% 97%   patient appears in NAD Eyes: anicteric Respiratory: Normal respiratory effort; CTA B Cardiovascular: RRR GI: soft, nt, nd  Review of Systems: Constitutional: negative for fevers, chills, sweats and anorexia Gastrointestinal: negative for nausea and diarrhea Integument/breast: negative for pruritus  Lab Results  Component Value Date   WBC 8.2 06/03/2020   HGB 9.7 (L) 06/03/2020   HCT 31.5 (L) 06/03/2020   MCV 104.0 (H) 06/03/2020   PLT 155 06/03/2020    Lab Results  Component Value Date   CREATININE 1.05 (H) 06/03/2020   BUN 29 (H) 06/03/2020   NA 138 06/03/2020   K 4.4 06/03/2020   CL 106 06/03/2020   CO2 25 06/03/2020    Lab Results  Component Value Date   ALT 16 05/31/2020   AST 16 05/31/2020   ALKPHOS 63 05/31/2020     Microbiology: Recent Results (from the past 240 hour(s))  Urine culture     Status: Abnormal   Collection Time: 05/27/20 10:07 AM   Specimen: In/Out Cath Urine  Result Value Ref Range Status   Specimen Description IN/OUT CATH URINE  Final   Special Requests   Final    NONE Performed at [facility], [address] [place], [place] [address]    Culture >=100,000 COLONIES/mL PROTEUS MIRABILIS (A)  Final   Report Status 05/29/2020 FINAL  Final   Organism ID, Bacteria PROTEUS MIRABILIS (A)  Final      Susceptibility   Proteus mirabilis - MIC*    AMPICILLIN <=2 SENSITIVE Sensitive     CEFAZOLIN <=4 SENSITIVE Sensitive     CEFTRIAXONE <=0.25 SENSITIVE Sensitive     CIPROFLOXACIN >=4 RESISTANT Resistant     GENTAMICIN <=1 SENSITIVE Sensitive     IMIPENEM 2 SENSITIVE Sensitive     NITROFURANTOIN 128 RESISTANT Resistant      TRIMETH/SULFA <=20 SENSITIVE Sensitive     AMPICILLIN/SULBACTAM <=2 SENSITIVE Sensitive     PIP/TAZO <=4 SENSITIVE Sensitive     * >=100,000 COLONIES/mL PROTEUS MIRABILIS  Blood Culture (routine x 2)     Status: None   Collection Time: 05/27/20 10:12 AM   Specimen: BLOOD  Result Value Ref Range Status   Specimen Description BLOOD RIGHT ANTECUBITAL  Final   Special Requests   Final    BOTTLES DRAWN AEROBIC AND ANAEROBIC Blood Culture results may not be optimal due to an inadequate volume of blood received in culture bottles   Culture   Final    NO GROWTH 5 DAYS Performed at [facility], [place] [address] [place], [place] [address]    Report Status 06/01/2020 FINAL  Final  Blood Culture (routine x 2)     Status: None   Collection Time: 05/27/20 10:30 AM   Specimen: BLOOD RIGHT HAND  Result Value Ref Range Status   Specimen Description BLOOD RIGHT HAND  Final   Special Requests   Final    BOTTLES DRAWN AEROBIC ONLY Blood Culture results may not be optimal due to an inadequate volume of blood received in culture bottles   Culture   Final  NO GROWTH 5 DAYS Performed at [facility], [place] [address] [place], [place] [address]    Report Status 06/01/2020 FINAL  Final  SARS Coronavirus 2 by RT PCR (hospital order, performed in [facility] hospital lab) Nasopharyngeal Nasopharyngeal Swab     Status: None   Collection Time: 05/27/20 11:18 AM   Specimen: Nasopharyngeal Swab  Result Value Ref Range Status   SARS Coronavirus 2 NEGATIVE NEGATIVE Final    Comment: (NOTE) SARS-CoV-2 target nucleic acids are NOT DETECTED.  The SARS-CoV-2 RNA is generally detectable in upper and lower respiratory specimens during the acute phase of infection. The lowest concentration of SARS-CoV-2 viral copies this assay can detect is 250 copies / mL. A negative result does not preclude SARS-CoV-2 infection and should not be used as the sole basis for treatment or other patient management  decisions.  A negative result may occur with improper specimen collection / handling, submission of specimen other than nasopharyngeal swab, presence of viral mutation(s) within the areas targeted by this assay, and inadequate number of viral copies (<250 copies / mL). A negative result must be combined with clinical observations, patient history, and epidemiological information.  Fact Sheet for Patients:   StrictlyIdeas.no  Fact Sheet for Healthcare Providers: BankingDealers.co.za  This test is not yet approved or  cleared by the [place] FDA and has been authorized for detection and/or diagnosis of SARS-CoV-2 by FDA under an Emergency Use Authorization (EUA).  This EUA will remain in effect (meaning this test can be used) for the duration of the COVID-19 declaration under Section 564(b)(1) of the Act, 21 U.S.C. section 360bbb-3(b)(1), unless the authorization is terminated or revoked sooner.  Performed at [facility], [place] [address], [place], [place] [address]   MRSA PCR Screening     Status: None   Collection Time: 05/27/20  8:45 PM   Specimen: Nasal Mucosa; Nasopharyngeal  Result Value Ref Range Status   MRSA by PCR NEGATIVE NEGATIVE Final    Comment:        The GeneXpert MRSA Assay (FDA approved for NASAL specimens only), is one component of a comprehensive MRSA colonization surveillance program. It is not intended to diagnose MRSA infection nor to guide or monitor treatment for MRSA infections. Performed at [facility], [place] [address], [place], [place] [address]   Fungus culture, blood     Status: None (Preliminary result)   Collection Time: 05/30/20 10:28 AM   Specimen: BLOOD RIGHT HAND  Result Value Ref Range Status   Specimen Description BLOOD RIGHT HAND  Final   Special Requests   Final    BOTTLES DRAWN AEROBIC AND ANAEROBIC Blood Culture adequate volume   Culture   Final    NO GROWTH 3  DAYS Performed at [facility], [place] [address], [place], [place] [address]    Report Status PENDING  Incomplete  Acid Fast Smear (AFB)     Status: None   Collection Time: 05/31/20  9:24 AM   Specimen: Bone Biopsy; Tissue  Result Value Ref Range Status   AFB Specimen Processing Comment  Final    Comment: Tissue Grinding and Digestion/Decontamination   Acid Fast Smear Negative  Final    Comment: (NOTE) Performed At: [facility] [place], [place] [id number] Rush Farmer MD TD:[id number]    Source (AFB) BONE MARROW  Final    Comment: Performed at [facility], [place] [address], [place], [place] [address]  Impression/Plan:  1. Fever - she has remained afebrile.  Remains unclear and no new concerns.  Bone marrow results pending with some preliminary results likely soon but ok from my standpoint for discharge.   ANA negative.  She has a video visit scheduled with me on 7/28 at 9 am  2.  Sob - currently asymptomatic

## 2020-06-03 NOTE — Plan of Care (Signed)
  Problem: Respiratory: Goal: Ability to maintain adequate ventilation will improve Outcome: Progressing   Problem: Education: Goal: Knowledge of General Education information will improve Description: Including pain rating scale, medication(s)/side effects and non-pharmacologic comfort measures Outcome: Progressing   

## 2020-06-03 NOTE — Progress Notes (Signed)
PROGRESS NOTE    April [name]  RUE:[id number] DOB: 04-07-1956 DOA: 05/27/2020 PCP: [name], MD   Brief Narrative:  Patient is a 64 year old female with history of polycystic disease status post transplant on chronic chemo immunosuppression therapy, COPD, chronic respiratory failure with hypoxia on 2 L of oxygen per minute at baseline, diastolic CHF, pulmonary hypertension who presents with shortness of breath.  She was just discharged from ER after being hospitalized from 6/21-6/30 for the management of sepsis.  She was found to have wheezing some chest discomfort, she denied cough.  On presentation she was febrile, tachycardic, tachypneic, had leukocytosis.  Chest x-ray did not show any acute abnormalities.   Blood cultures were obtained.  She was started on antibiotics, IV fluids.  Urine culture growing Proteus  Assessment & Plan:  Sepsis like symptoms:  Fever of unknown origin Admitted yet again with fever-temp of 103, tachycardia, tachypnea -Chest x-ray without acute findings -UTI growing Proteus but no symptoms of UTI -Patient has a very small chronic sacral ulcer without any surrounding signs or symptoms of infection,, recent MRI on 6/23 was negative for abscess, osteomyelitis, recent CT chest/abd pelvis negative too, dopplers negative -Frequent hospitalizations for same, this is the 11th admission for similar symptoms in 67months, each time with fever, sepsis type symptoms with negative work-up  -Immunocompromised state puts her at risk of opportunistic infections,, work-up unrevealing thus far -Appreciate ID input, remained stable for 4days off antibiotics and on home dose of prednisone  -Peripheral smear unremarkable -Bone marrow aspiration biopsy completed on 7/9  as part of FUO work-up, prelim results pending, plan for DC tomorrow with ID follow up  COPD -Chronic respiratory failure on 2 L home O2 at baseline -No wheezing, I think her tachypnea was in the setting of  fever -Chest x-ray unremarkable, continue nebs prn  Chronic diastolic congestive heart failure:  -last echocardiogram showed ejection fraction of 14%, grade 2 diastolic dysfunction.  -Clinically euvolemic  Hyponatremia:  -Improved, hydrated with saline  History of SVT:  - Continue verapamil 240 mg daily.  Hyperglycemia:  - Last hemoglobin A1c of 5.9 as per 04/29/2020.  Monitor CBGs.  History of kidney transplant:  -On chronic immunosuppressive therapy, continue home regimen  Anemia of chronic disease: Currently H&H stable.  History with CKD  History of pulmonary hypertension:: On supplemental oxygen at baseline.  Sacral ulcer: Has a stage II pressure ulcer on the sacrum.  Present on admission.  MRI done on 6/1 3 did not show any signs of abscess or osteomyelitis.  Frequent turning. Ulcer does not look infected.  Debility/deconditioning: Was discharged to rehab( [facility]) on her last admission.  Has significant debility/deconditioning. -PT following  DVT prophylaxis: Lovenox Code Status: Full Family Communication: Discussed with patient in detail, no family at bedside Status is: Inpatient  Remains inpatient appropriate because: Ongoing work-up of FUO leading to 11 hospitalizations in 6 months without clear diagnosis   Dispo: The patient is from: SNF              Anticipated d/c is to: SNF              Anticipated d/c date is: 7/13              Patient currently is not medically stable to d/c.  Consultants: ID  Procedures: Bone marrow aspiration biopsy 7/9  Antimicrobials:  Anti-infectives (From admission, onward)   Start     Dose/Rate Route Frequency Ordered Stop   05/27/20 2200  ceFEPIme (MAXIPIME)  2 g in sodium chloride 0.9 % 100 mL IVPB  Status:  Discontinued        2 g 200 mL/hr over 30 Minutes Intravenous  Once 05/27/20 1530 05/27/20 1542   05/27/20 2200  ceFEPIme (MAXIPIME) 2 g in sodium chloride 0.9 % 100 mL IVPB  Status:  Discontinued        2 g 200 mL/hr over  30 Minutes Intravenous Every 12 hours 05/27/20 1542 05/29/20 1520   05/27/20 1600  valACYclovir (VALTREX) tablet 500 mg     Discontinue     500 mg Oral 2 times daily 05/27/20 1528     05/27/20 1030  ceFEPIme (MAXIPIME) 2 g in sodium chloride 0.9 % 100 mL IVPB        2 g 200 mL/hr over 30 Minutes Intravenous  Once 05/27/20 1025 05/27/20 1305   05/27/20 1030  vancomycin (VANCOREADY) IVPB 1500 mg/300 mL        1,500 mg 150 mL/hr over 120 Minutes Intravenous  Once 05/27/20 1025 05/27/20 1523   05/27/20 1015  cefTRIAXone (ROCEPHIN) 1 g in sodium chloride 0.9 % 100 mL IVPB  Status:  Discontinued        1 g 200 mL/hr over 30 Minutes Intravenous  Once 05/27/20 1012 05/27/20 1024   05/27/20 1015  azithromycin (ZITHROMAX) 500 mg in sodium chloride 0.9 % 250 mL IVPB  Status:  Discontinued        500 mg 250 mL/hr over 60 Minutes Intravenous  Once 05/27/20 1012 05/27/20 1024      Subjective: -Feels okay, no events overnight, remains afebrile, no respiratory issues no GU complaints  Objective: Vitals:   06/02/20 2132 06/03/20 0451 06/03/20 0758 06/03/20 1015  BP: 134/63 138/64  122/61  Pulse: 88 100 98 (!) 105  Resp: 20  14 18   Temp: 98.6 F (37 C) 98 F (36.7 C)  98.1 F (36.7 C)  TempSrc: Oral Oral  Oral  SpO2: 95% 95% 93% 97%  Weight: 74.4 kg     Height:        Intake/Output Summary (Last 24 hours) at 06/03/2020 1218 Last data filed at 06/03/2020 1019 Gross per 24 hour  Intake 1040 ml  Output 3300 ml  Net -2260 ml   Filed Weights   05/30/20 2104 06/01/20 2142 06/02/20 2132  Weight: 75.8 kg 75.4 kg 74.4 kg    Examination:  General exam: Chronically ill pleasant female sitting up in bed, AAOx3, no distress HEENT: No JVD CVS: S1-S2, regular rate rhythm Lungs: Poor air movement bilaterally otherwise clear Abdomen: Soft, nontender, bowel sounds present Extremities: No edema, ecchymosis in both upper arms Skin: Numerous skin breakdowns, scattered bruises, stage II sacral ulcer  present on admission    Data Reviewed: I have personally reviewed following labs and imaging studies  CBC: Recent Labs  Lab 05/29/20 0805 05/29/20 0805 05/30/20 0824 05/31/20 0615 06/01/20 0723 06/02/20 0451 06/03/20 0438  WBC 13.0*   < > 11.0* 5.3 5.0 6.0 8.2  NEUTROABS 12.2*  --   --  4.1 3.7  --   --   HGB 8.6*   < > 8.6* 8.5* 9.1* 8.5* 9.7*  HCT 28.6*   < > 29.1* 28.9* 30.8* 28.7* 31.5*  MCV 102.9*   < > 103.2* 105.9* 104.4* 103.6* 104.0*  PLT 182   < > 176 152 140* 135* 155   < > = values in this interval not displayed.   Basic Metabolic Panel: Recent Labs  Lab 05/28/20 0548  05/28/20 8841 05/30/20 6606 05/31/20 0615 06/01/20 0723 06/02/20 0451 06/03/20 0438  NA 137   < > 136 139 140 143 138  K 4.7   < > 4.4 4.8 4.6 4.4 4.4  CL 104   < > 106 109 108 111 106  CO2 22   < > 20* 24 25 23 25   GLUCOSE 188*   < > 166* 122* 107* 115* 123*  BUN 32*   < > 53* 53* 46* 36* 29*  CREATININE 0.93   < > 1.22* 1.15* 1.01* 0.90 1.05*  CALCIUM 9.4   < > 9.7 9.8 9.9 9.8 9.4  MG 2.2  --   --   --   --   --   --    < > = values in this interval not displayed.   GFR: Estimated Creatinine Clearance: 50.6 mL/min (A) (by C-G formula based on SCr of 1.05 mg/dL (H)). Liver Function Tests: Recent Labs  Lab 05/30/20 0824 05/31/20 0615  AST 16 16  ALT 16 16  ALKPHOS 63 63  BILITOT 0.7 0.7  PROT 6.5 6.2*  ALBUMIN 2.4* 2.3*   No results for input(s): LIPASE, AMYLASE in the last 168 hours. No results for input(s): AMMONIA in the last 168 hours. Coagulation Profile: No results for input(s): INR, PROTIME in the last 168 hours. Cardiac Enzymes: No results for input(s): CKTOTAL, CKMB, CKMBINDEX, TROPONINI in the last 168 hours. BNP (last 3 results) No results for input(s): PROBNP in the last 8760 hours. HbA1C: No results for input(s): HGBA1C in the last 72 hours. CBG: Recent Labs  Lab 06/02/20 1101 06/02/20 1611 06/02/20 2133 06/03/20 0641 06/03/20 1133  GLUCAP 115* 157* 86  112* 124*   Lipid Profile: No results for input(s): CHOL, HDL, LDLCALC, TRIG, CHOLHDL, LDLDIRECT in the last 72 hours. Thyroid Function Tests: No results for input(s): TSH, T4TOTAL, FREET4, T3FREE, THYROIDAB in the last 72 hours. Anemia Panel: No results for input(s): VITAMINB12, FOLATE, FERRITIN, TIBC, IRON, RETICCTPCT in the last 72 hours. Sepsis Labs: Recent Labs  Lab 05/31/20 0615  PROCALCITON 0.22    Recent Results (from the past 240 hour(s))  Urine culture     Status: Abnormal   Collection Time: 05/27/20 10:07 AM   Specimen: In/Out Cath Urine  Result Value Ref Range Status   Specimen Description IN/OUT CATH URINE  Final   Special Requests   Final    NONE Performed at [facility], [address], [place], [place] [address]    Culture >=100,000 COLONIES/mL PROTEUS MIRABILIS (A)  Final   Report Status 05/29/2020 FINAL  Final   Organism ID, Bacteria PROTEUS MIRABILIS (A)  Final      Susceptibility   Proteus mirabilis - MIC*    AMPICILLIN <=2 SENSITIVE Sensitive     CEFAZOLIN <=4 SENSITIVE Sensitive     CEFTRIAXONE <=0.25 SENSITIVE Sensitive     CIPROFLOXACIN >=4 RESISTANT Resistant     GENTAMICIN <=1 SENSITIVE Sensitive     IMIPENEM 2 SENSITIVE Sensitive     NITROFURANTOIN 128 RESISTANT Resistant     TRIMETH/SULFA <=20 SENSITIVE Sensitive     AMPICILLIN/SULBACTAM <=2 SENSITIVE Sensitive     PIP/TAZO <=4 SENSITIVE Sensitive     * >=100,000 COLONIES/mL PROTEUS MIRABILIS  Blood Culture (routine x 2)     Status: None   Collection Time: 05/27/20 10:12 AM   Specimen: BLOOD  Result Value Ref Range Status   Specimen Description BLOOD RIGHT ANTECUBITAL  Final   Special Requests  Final    BOTTLES DRAWN AEROBIC AND ANAEROBIC Blood Culture results may not be optimal due to an inadequate volume of blood received in culture bottles   Culture   Final    NO GROWTH 5 DAYS Performed at [facility], [place] [address] [place], [place] [address]    Report Status  06/01/2020 FINAL  Final  Blood Culture (routine x 2)     Status: None   Collection Time: 05/27/20 10:30 AM   Specimen: BLOOD RIGHT HAND  Result Value Ref Range Status   Specimen Description BLOOD RIGHT HAND  Final   Special Requests   Final    BOTTLES DRAWN AEROBIC ONLY Blood Culture results may not be optimal due to an inadequate volume of blood received in culture bottles   Culture   Final    NO GROWTH 5 DAYS Performed at [facility], [place] [address], [place], [place] [address]    Report Status 06/01/2020 FINAL  Final  SARS Coronavirus 2 by RT PCR (hospital order, performed in [facility] hospital lab) Nasopharyngeal Nasopharyngeal Swab     Status: None   Collection Time: 05/27/20 11:18 AM   Specimen: Nasopharyngeal Swab  Result Value Ref Range Status   SARS Coronavirus 2 NEGATIVE NEGATIVE Final    Comment: (NOTE) SARS-CoV-2 target nucleic acids are NOT DETECTED.  The SARS-CoV-2 RNA is generally detectable in upper and lower respiratory specimens during the acute phase of infection. The lowest concentration of SARS-CoV-2 viral copies this assay can detect is 250 copies / mL. A negative result does not preclude SARS-CoV-2 infection and should not be used as the sole basis for treatment or other patient management decisions.  A negative result may occur with improper specimen collection / handling, submission of specimen other than nasopharyngeal swab, presence of viral mutation(s) within the areas targeted by this assay, and inadequate number of viral copies (<250 copies / mL). A negative result must be combined with clinical observations, patient history, and epidemiological information.  Fact Sheet for Patients:   StrictlyIdeas.no  Fact Sheet for Healthcare Providers: BankingDealers.co.za  This test is not yet approved or  cleared by the [place] FDA and has been authorized for detection and/or diagnosis of SARS-CoV-2  by FDA under an Emergency Use Authorization (EUA).  This EUA will remain in effect (meaning this test can be used) for the duration of the COVID-19 declaration under Section 564(b)(1) of the Act, 21 U.S.C. section 360bbb-3(b)(1), unless the authorization is terminated or revoked sooner.  Performed at [facility], [place] [address], [place], [place] [address]   MRSA PCR Screening     Status: None   Collection Time: 05/27/20  8:45 PM   Specimen: Nasal Mucosa; Nasopharyngeal  Result Value Ref Range Status   MRSA by PCR NEGATIVE NEGATIVE Final    Comment:        The GeneXpert MRSA Assay (FDA approved for NASAL specimens only), is one component of a comprehensive MRSA colonization surveillance program. It is not intended to diagnose MRSA infection nor to guide or monitor treatment for MRSA infections. Performed at [facility], [place] [address] [place], [place] [address]   Fungus culture, blood     Status: None (Preliminary result)   Collection Time: 05/30/20 10:28 AM   Specimen: BLOOD RIGHT HAND  Result Value Ref Range Status   Specimen Description BLOOD RIGHT HAND  Final   Special Requests   Final    BOTTLES DRAWN AEROBIC  AND ANAEROBIC Blood Culture adequate volume   Culture   Final    NO GROWTH 3 DAYS Performed at [facility], [place] [address] [place], [place] [address]    Report Status PENDING  Incomplete  Acid Fast Smear (AFB)     Status: None   Collection Time: 05/31/20  9:24 AM   Specimen: Bone Biopsy; Tissue  Result Value Ref Range Status   AFB Specimen Processing Comment  Final    Comment: Tissue Grinding and Digestion/Decontamination   Acid Fast Smear Negative  Final    Comment: (NOTE) Performed At: [facility] [place], [place] [id number] Rush Farmer MD OZ:[id number]    Source (AFB) BONE MARROW  Final    Comment: Performed at [facility], [place] [address] [place], [place] [address]         Radiology  Studies: No results found.      Scheduled Meds: . allopurinol  300 mg Oral Daily  . aspirin  81 mg Oral Daily  . brimonidine  1 drop Both Eyes BID  . budesonide  1 mg Nebulization BID  . collagenase  1 application Topical Daily  . cycloSPORINE  1 drop Both Eyes BID  . dorzolamide  1 drop Both Eyes Daily  . enoxaparin (LOVENOX) injection  40 mg Subcutaneous Q24H  . ferrous sulfate  325 mg Oral Q breakfast  . FLUoxetine  40 mg Oral Daily  . gabapentin  200 mg Oral TID  . guaiFENesin  600 mg Oral BID  . insulin aspart  0-5 Units Subcutaneous QHS  . insulin aspart  0-9 Units Subcutaneous TID WC  . isosorbide mononitrate  60 mg Oral Daily  . latanoprost  1 drop Both Eyes QHS  . magnesium oxide  400 mg Oral BID  . multivitamin with minerals  1 tablet Oral Daily  . mycophenolate  180 mg Oral BID  . pantoprazole  40 mg Oral BID  . senna-docusate  2 tablet Oral Daily  . sodium chloride flush  3 mL Intravenous Q12H  . tacrolimus  0.5 mg Oral QHS  . tacrolimus  1 mg Oral Daily  . valACYclovir  500 mg Oral BID  . verapamil  240 mg Oral Daily  . cyanocobalamin  1,000 mcg Oral Daily   Continuous Infusions:    LOS: 7 days    Time spent: 18min  Domenic Polite, MD [facility] P7/10/2020, 12:18 PM

## 2020-06-04 DIAGNOSIS — J9611 Chronic respiratory failure with hypoxia: Secondary | ICD-10-CM

## 2020-06-04 DIAGNOSIS — J9622 Acute and chronic respiratory failure with hypercapnia: Secondary | ICD-10-CM

## 2020-06-04 LAB — COMPREHENSIVE METABOLIC PANEL
ALT: 12 U/L (ref 0–44)
AST: 15 U/L (ref 15–41)
Albumin: 2.4 g/dL — ABNORMAL LOW (ref 3.5–5.0)
Alkaline Phosphatase: 58 U/L (ref 38–126)
Anion gap: 7 (ref 5–15)
BUN: 24 mg/dL — ABNORMAL HIGH (ref 8–23)
CO2: 27 mmol/L (ref 22–32)
Calcium: 9.4 mg/dL (ref 8.9–10.3)
Chloride: 102 mmol/L (ref 98–111)
Creatinine, Ser: 0.95 mg/dL (ref 0.44–1.00)
GFR calc Af Amer: >60 mL/min (ref >60)
GFR calc non Af Amer: >60 mL/min (ref >60)
Glucose, Bld: 93 mg/dL (ref 70–99)
Potassium: 4.7 mmol/L (ref 3.5–5.1)
Sodium: 136 mmol/L (ref 135–145)
Total Bilirubin: 1.5 mg/dL — ABNORMAL HIGH (ref 0.3–1.2)
Total Protein: 6.4 g/dL — ABNORMAL LOW (ref 6.5–8.1)

## 2020-06-04 LAB — CBC WITH DIFFERENTIAL/PLATELET
Abs Immature Granulocytes: 0.08 10*3/uL — ABNORMAL HIGH (ref 0.00–0.07)
Basophils Absolute: 0 10*3/uL (ref 0.0–0.1)
Basophils Relative: 0 %
Eosinophils Absolute: 0.1 10*3/uL (ref 0.0–0.5)
Eosinophils Relative: 1 %
HCT: 29.9 % — ABNORMAL LOW (ref 36.0–46.0)
Hemoglobin: 8.9 g/dL — ABNORMAL LOW (ref 12.0–15.0)
Immature Granulocytes: 1 %
Lymphocytes Relative: 16 %
Lymphs Abs: 1.1 10*3/uL (ref 0.7–4.0)
MCH: 30.8 pg (ref 26.0–34.0)
MCHC: 29.8 g/dL — ABNORMAL LOW (ref 30.0–36.0)
MCV: 103.5 fL — ABNORMAL HIGH (ref 80.0–100.0)
Monocytes Absolute: 0.3 10*3/uL (ref 0.1–1.0)
Monocytes Relative: 4 %
Neutro Abs: 5.5 10*3/uL (ref 1.7–7.7)
Neutrophils Relative %: 78 %
Platelets: 158 10*3/uL (ref 150–400)
RBC: 2.89 MIL/uL — ABNORMAL LOW (ref 3.87–5.11)
RDW: 21.4 % — ABNORMAL HIGH (ref 11.5–15.5)
WBC: 7.1 10*3/uL (ref 4.0–10.5)
nRBC: 0 % (ref 0.0–0.2)

## 2020-06-04 LAB — GLUCOSE, CAPILLARY
Glucose-Capillary: 87 mg/dL (ref 70–99)
Glucose-Capillary: 109 mg/dL — ABNORMAL HIGH (ref 70–99)
Glucose-Capillary: 117 mg/dL — ABNORMAL HIGH (ref 70–99)
Glucose-Capillary: 110 mg/dL — ABNORMAL HIGH (ref 70–99)

## 2020-06-04 LAB — SURGICAL PATHOLOGY

## 2020-06-04 MED ORDER — ARFORMOTEROL TARTRATE 15 MCG/2ML IN NEBU
15.0000 ug | INHALATION_SOLUTION | Freq: Two times a day (BID) | RESPIRATORY_TRACT | Status: DC
  Administered 2020-06-05 – 2020-06-06 (×3): 15 ug via RESPIRATORY_TRACT
  Filled 2020-06-04 (×3): qty 2

## 2020-06-04 MED ORDER — ARFORMOTEROL TARTRATE 15 MCG/2ML IN NEBU: 15.0000 ug | INHALATION_SOLUTION | Freq: Two times a day (BID) | RESPIRATORY_TRACT | Status: DC

## 2020-06-04 MED ORDER — MOMETASONE FURO-FORMOTEROL FUM 100-5 MCG/ACT IN AERO: 2.0000 | INHALATION_SPRAY | Freq: Two times a day (BID) | RESPIRATORY_TRACT | Status: DC

## 2020-06-04 NOTE — Progress Notes (Signed)
Occupational Therapy Treatment Patient Details Name: April [name] MRN: [id number] DOB: [birth year] Today's Date: 06/04/2020    History of present illness April [name] is a 64 y.o. female with medical history significant of polycystic kidney disease s/p renal transplant on chronic immunosuppressive therapy, COPD, chronic respiratory failure with hypoxia on 2 L of nasal cannula oxygen at baseline, diastolic CHF, and pulmonary hypertension.  She presents with complaints of progressively worsening shortness of breath over the last 2 days.  She just recently been hospitalized from 6/21-6/30 with sepsis of origin.   OT comments  Patient supine in bed on arrival.  Patient recently requesting to shower in shower but today decided she was too weak/fatigued to do so.  Instead completed wash up bath at sink with set up and supervision.  Patient able to stand with RW and min assist and walk ~6 ft to sink with min guard, but needing to sit in chair right away.  Activity tolerance and general weakness continue to limit patient's independence, though she is showing improvement.  Completed grooming with set up and some standing with min assist.  Patient is motivated to progress.  Will continue to follow with OT acutely to address the deficits listed below.    Follow Up Recommendations  SNF;Supervision/Assistance - 24 hour    Equipment Recommendations  Other (comment) (defer to ext venue)    Recommendations for Other Services      Precautions / Restrictions Precautions Precautions: Fall Precaution Comments: monitor for sats       Mobility Bed Mobility Overal bed mobility: Needs Assistance Bed Mobility: Supine to Sit Rolling: Min guard            Transfers Overall transfer level: Needs assistance Equipment used: Rolling walker (2 wheeled) Transfers: Sit to/from Stand Sit to Stand: Min assist              Balance Overall balance assessment: Needs assistance Sitting-balance  support: No upper extremity supported;Feet supported Sitting balance-Leahy Scale: Good     Standing balance support: Bilateral upper extremity supported Standing balance-Leahy Scale: Poor                             ADL either performed or assessed with clinical judgement   ADL Overall ADL's : Needs assistance/impaired     Grooming: Wash/dry hands;Wash/dry face;Oral care;Minimal assistance;Sitting;Standing;Brushing hair;Applying deodorant Grooming Details (indicate cue type and reason): Patient stayed seated at sink for most of grooming but stoof with min assist to spit toothpaste/rinse mouth Upper Body Bathing: Set up;Sitting       Upper Body Dressing : Set up;Sitting                   Functional mobility during ADLs: Minimal assistance;Rolling walker General ADL Comments: Patient with poor activity tolerance and fatigues quickly     Vision       Perception     Praxis      Cognition Arousal/Alertness: Awake/alert Behavior During Therapy: WFL for tasks assessed/performed Overall Cognitive Status: Within Functional Limits for tasks assessed                                          Exercises Exercises: Other exercises Other Exercises Other Exercises: 3 sit <> stands during session and walk ~6 ft   Shoulder Instructions  General Comments      Pertinent Vitals/ Pain       Pain Assessment: Faces Faces Pain Scale: Hurts a little bit Pain Location: sacral area Pain Descriptors / Indicators: Operative site guarding Pain Intervention(s): Monitored during session;Repositioned  Home Living                                          Prior Functioning/Environment              Frequency  Min 2X/week        Progress Toward Goals  OT Goals(current goals can now be found in the care plan section)  Progress towards OT goals: Progressing toward goals  Acute Rehab OT Goals Patient Stated Goal: to get  home OT Goal Formulation: With patient Time For Goal Achievement: 06/11/20 Potential to Achieve Goals: Good  Plan Discharge plan remains appropriate    Co-evaluation                 AM-PAC OT "6 Clicks" Daily Activity     Outcome Measure   Help from another person eating meals?: None Help from another person taking care of personal grooming?: A Little Help from another person toileting, which includes using toliet, bedpan, or urinal?: A Little Help from another person bathing (including washing, rinsing, drying)?: A Lot Help from another person to put on and taking off regular upper body clothing?: A Little Help from another person to put on and taking off regular lower body clothing?: A Lot 6 Click Score: 17    End of Session Equipment Utilized During Treatment: Gait belt;Rolling walker  OT Visit Diagnosis: Muscle weakness (generalized) (M62.81);History of falling (Z91.81);Unsteadiness on feet (R26.81);Pain Pain - part of body:  (sacral)   Activity Tolerance Patient tolerated treatment well;Patient limited by fatigue   Patient Left in chair;with call bell/phone within reach;with chair alarm set   Nurse Communication Mobility status        Time: 0071-2197 OT Time Calculation (min): 42 min  Charges: OT General Charges $OT Visit: 1 Visit OT Treatments $Self Care/Home Management : 38-52 mins  August Luz, OTR/L    Phylliss Bob 06/04/2020, 3:07 PM

## 2020-06-04 NOTE — Progress Notes (Signed)
Physical Therapy Treatment Patient Details Name: April [name] MRN: [id number] DOB: 1955-12-24 Today's Date: 06/04/2020    History of Present Illness April [name] is a 64 y.o. female with medical history significant of polycystic kidney disease s/p renal transplant on chronic immunosuppressive therapy, COPD, chronic respiratory failure with hypoxia on 2 L of nasal cannula oxygen at baseline, diastolic CHF, and pulmonary hypertension.  She presents with complaints of progressively worsening shortness of breath over the last 2 days.  She just recently been hospitalized from 6/21-6/30 with sepsis of origin.    PT Comments    Pt was seen for updating of functional mobility and strength testing, with better control of standing and gait today.  Pt is not SOB but in some pain over her testing, asked for Tylenol to manage.  Follow acutely for her goals of therapy, esp to strengthen legs and work on standing endurance to shorten her stay in rehab setting for home.   Follow Up Recommendations  SNF     Equipment Recommendations  None recommended by PT    Recommendations for Other Services       Precautions / Restrictions Precautions Precautions: Fall Precaution Comments: monitor for sats Restrictions Weight Bearing Restrictions: No    Mobility  Bed Mobility               General bed mobility comments: in chair upon PT arrival  Transfers Overall transfer level: Needs assistance Equipment used: Rolling walker (2 wheeled) Transfers: Sit to/from Stand Sit to Stand: Min assist Stand pivot transfers: Min guard       General transfer comment: using hands on arms of chair to stand  Ambulation/Gait Ambulation/Gait assistance: Min guard Gait Distance (Feet): 10 Feet (forward and back) Assistive device: Rolling walker (2 wheeled) Gait Pattern/deviations: Step-to pattern;Shuffle;Wide base of support     General Gait Details: pt has unsteadiness and LE buckling noted during  gait   Stairs             Wheelchair Mobility    Modified Rankin (Stroke Patients Only)       Balance     Sitting balance-Leahy Scale: Good       Standing balance-Leahy Scale: Poor                              Cognition Arousal/Alertness: Awake/alert Behavior During Therapy: WFL for tasks assessed/performed Overall Cognitive Status: Within Functional Limits for tasks assessed                                        Exercises General Exercises - Lower Extremity Ankle Circles/Pumps: Strengthening;10 reps Quad Sets: Strengthening;10 reps Gluteal Sets: Strengthening;10 reps Hip ABduction/ADduction: Strengthening;10 reps    General Comments General comments (skin integrity, edema, etc.): pt is easily tired and in some pain over her sacral wound and biopsy      Pertinent Vitals/Pain Pain Assessment: Faces Faces Pain Scale: Hurts little more Pain Location: sacral area Pain Descriptors / Indicators: Operative site guarding Pain Intervention(s): Monitored during session;Patient requesting pain meds-RN notified    Home Living                      Prior Function            PT Goals (current goals can now be found in the care plan  section) Acute Rehab PT Goals Patient Stated Goal: to get home Progress towards PT goals: Progressing toward goals    Frequency    Min 3X/week      PT Plan Current plan remains appropriate    Co-evaluation              AM-PAC PT "6 Clicks" Mobility   Outcome Measure  Help needed turning from your back to your side while in a flat bed without using bedrails?: None Help needed moving from lying on your back to sitting on the side of a flat bed without using bedrails?: None Help needed moving to and from a bed to a chair (including a wheelchair)?: A Little Help needed standing up from a chair using your arms (e.g., wheelchair or bedside chair)?: A Little Help needed to walk in  hospital room?: A Little Help needed climbing 3-5 steps with a railing? : A Lot 6 Click Score: 19    End of Session Equipment Utilized During Treatment: Oxygen Activity Tolerance: Patient limited by fatigue Patient left: in chair;with call bell/phone within reach;with chair alarm set Nurse Communication: Mobility status PT Visit Diagnosis: Muscle weakness (generalized) (M62.81);Other abnormalities of gait and mobility (R26.89) Pain - part of body: Hip     Time: 5300-5110 PT Time Calculation (min) (ACUTE ONLY): 16 min  Charges:  $Gait Training: 8-22 mins                    Ramond Dial 06/04/2020, 11:15 AM  Mee Hives, PT MS Acute Rehab Dept. Number: [place] [facility] [place]

## 2020-06-04 NOTE — Progress Notes (Signed)
I spoke with pathology regarding this patients bone marrow results.  She has a monoclonal spike and c/w SPEP findings.  She will be referred to hematology.  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   I discussed with her briefly about the results and the referral.  Immunofixation sent.   Thayer Headings, MD

## 2020-06-04 NOTE — TOC Initial Note (Addendum)
Transition of Care [facility]) - Initial/Assessment Note    Patient Details  Name: April [name] MRN: [id number] Date of Birth: 07-Jan-1956  Transition of Care [facility]) CM/SW Contact:    Sable Feil, LCSW Phone Number: 06/04/2020, 11:25 AM  Clinical Narrative:  Talked with patient regarding her discharge plan. April [name] was sitting in a chair at bedside and was alert, oriented and willing to talk with CSW. April [name] confirmed that she is from [facility] and plans to return there at discharge to continue rehab.     Clinicals faxed to [employer] for review. Authorization pending.                 Expected Discharge Plan: [place] (From [facility]) Barriers to Discharge: Insurance Authorization (PT needs to see)   Patient Goals and CMS Choice Patient states their goals for this hospitalization and ongoing recovery are:: Patient's plan is to return to [place] to continue her rehab CMS Medicare.gov Compare Post Acute Care list provided to:: Other (Comment Required) (Not needed as partient plans to return to [facility]) Choice offered to / list presented to : NA  Expected Discharge Plan and Services Expected Discharge Plan: [place] (From [place] [facility]) In-house Referral: Clinical Social Work   Post Acute Care Choice: [place] Living arrangements for the past 2 months: [place]                                     Prior Living Arrangements/Services Living arrangements for the past 2 months: [place] Lives with:: Facility Resident (Currently at [place] [facility] for [place] [facility], then will return home) Patient language and need for interpreter reviewed:: No Do you feel safe going back to the place where you live?: No   Patient agreeable to returning to [place] [facility] to continue therapy  Need for Family Participation in Patient Care: No (Comment) Care giver support system in place?: Yes (comment)    Criminal Activity/Legal Involvement Pertinent to Current Situation/Hospitalization: No - Comment as needed  Activities of Daily Living Home Assistive Devices/Equipment: Oxygen, Transfer belt, Wheelchair, Bedside commode/3-in-1 ADL Screening (condition at time of admission) Patient's cognitive ability adequate to safely complete daily activities?: Yes Is the patient deaf or have difficulty hearing?: No Does the patient have difficulty seeing, even when wearing glasses/contacts?: No Does the patient have difficulty concentrating, remembering, or making decisions?: No Patient able to express need for assistance with ADLs?: Yes Does the patient have difficulty dressing or bathing?: Yes Independently performs ADLs?: No Does the patient have difficulty walking or climbing stairs?: Yes Weakness of Legs: Both Weakness of Arms/Hands: None  Permission Sought/Granted Permission sought to share information with : Other (comment) (Did not seek permission to talk with family during visit on 7/13) Permission granted to share information with : No (Will f/u with patient regardng talking with family if needed)              Emotional Assessment Appearance:: Appears older than stated age Attitude/Demeanor/Rapport: Engaged Affect (typically observed): Accepting, Appropriate Orientation: : Oriented to Self, Oriented to Place, Oriented to  Time, Oriented to Situation Alcohol / Substance Use: Tobacco Use, Alcohol Use, Illicit Drugs (Per H&P patient quit smoking and does not drink or use illicit drugs) Psych Involvement: No (comment)  Admission diagnosis:  Sepsis [facility]) [A41.9] Patient Active Problem List   Diagnosis Date Noted  . History  of immunosuppressive therapy 05/27/2020  . Fever 05/15/2020  . Acute on chronic respiratory failure with hypoxia and hypercapnia (HCC)   . Immunocompromised state ([place])   . Decubitus ulcer of coccyx, unstageable ([place]) 05/01/2020  . Asthma 04/29/2020  . Sepsis due  to gram-negative UTI ([place]) 04/16/2020  . Pericardial effusion 04/16/2020  . Chronic respiratory failure with hypoxia ([place]) 04/16/2020  . Proteinuria 04/16/2020  . Acute respiratory distress 04/16/2020  . Pressure injury of skin 04/08/2020  . COPD with acute exacerbation ([place]) 04/07/2020  . Acute on chronic respiratory failure with hypoxia ([place]) 03/14/2020  . PNA (pneumonia) 03/13/2020  . SVT (supraventricular tachycardia) ([place])   . Anemia of chronic disease   . Palliative care encounter   . Goals of care, counseling/discussion   . Essential hypertension 02/08/2020  . Glaucoma 02/08/2020  . Sepsis ([place]) 01/31/2020  . Hypoxia   . Shortness of breath   . Fracture of left superior pubic ramus (HCC)   . Pain   . Anxiety state   . Left displaced femoral neck fracture ([place]) 12/15/2019  . Status post total hip replacement, left   . Post-op pain   . Acute blood loss anemia   . Polycystic kidney   . Closed left hip fracture ([place]) 12/07/2019  . Left wrist fracture 12/07/2019  . Medication management 09/22/2019  . Physical deconditioning 09/22/2019  . Hyponatremia 06/29/2019  . Acute respiratory failure with hypoxia ([place]) 04/19/2019  . Acute on chronic diastolic CHF (congestive heart failure) ([place]) 04/19/2019  . Bilateral closed proximal tibial fracture 12/21/2018  . Closed right ankle fracture 12/21/2018  . Fracture 12/21/2018  . Lower urinary tract infectious disease 10/03/2018  . Asthma, chronic, unspecified asthma severity, with acute exacerbation 10/03/2018  . SIRS (systemic inflammatory response syndrome) ([place]) 10/03/2018  . Nausea vomiting and diarrhea 10/02/2018  . Chronic diastolic CHF (congestive heart failure) ([place]) 10/01/2017  . Influenza B 10/01/2017  . Persistent asthma with status asthmaticus   . Pneumonia 07/15/2016  . Asthma, mild intermittent 07/15/2016  . GERD without esophagitis 07/15/2016  . Renal transplant recipient 07/15/2016  . Hyperlipidemia 07/15/2016  .  Depression 07/15/2016  . Bronchitis, mucopurulent recurrent ([place]) 08/08/2014  . Chronic cough 07/24/2014  . End stage renal disease ([place]) 03/23/2012  . 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, implant, and graft 03/23/2012   PCP:  [name], MD Pharmacy:  No Pharmacies Listed    Social Determinants of Health (SDOH) Interventions  No SDOH interventions requested or needed at this time  Readmission Risk Interventions Readmission Risk Prevention Plan 05/15/2020 04/19/2020 02/09/2020  Transportation Screening Complete Complete Complete  PCP or Specialist Appt within 3-5 Days - - -  Not Complete comments - - -  HRI or [place] or Home Care Consult comments - - -  Social Work Consult for [place] consult not completed comments - - -  Palliative Care Screening - - -  Medication Review ([place]) Referral to Pharmacy Complete Referral to Pharmacy  PCP or Specialist appointment within 3-5 days of discharge Complete Complete Complete  HRI or Home Care Consult Complete Complete Complete  SW Recovery Care/Counseling Consult Complete Complete -  Palliative Care Screening Not Applicable Not Applicable Complete  Skilled Nursing Facility Complete Complete Patient Refused  Some recent data might be hidden

## 2020-06-04 NOTE — Progress Notes (Signed)
The SPEP with an M spike of unknown significance.  The bone marrow may help with this and I will send off immunfixation as well and this can be followed as an outpatient if she is discharged today. Thayer Headings, MD

## 2020-06-04 NOTE — Discharge Summary (Addendum)
Physician Discharge Summary  April [name]:[id number] DOB: October 12, 1956 DOA: 05/27/2020  PCP: [name], MD  Admit date: 05/27/2020 Discharge date: 06/04/2020  Time spent: 45 minutes  Recommendations for Outpatient Follow-up:  1. PCP in 1 week 2. Follow-up immunofixation-ADDENDUM: Immunofixation with Kappa Spike, Hematology referral made to [facility], appt made for 7/19 3. Infectious disease Dr. Linus Salmons 7/28 for a virtual visit to follow-up bone marrow aspiration biopsy, cultures   Discharge Diagnoses:  Fever of unknown origin SIRS   Renal transplant recipient   Chronic diastolic CHF (congestive heart failure) ([place])   Hyponatremia   Sepsis ([place])   Anemia of chronic disease   Acute on chronic respiratory failure with hypoxia ([facility])   COPD([facility])   FUO (fever of unknown origin)   History of immunosuppressive therapy Small stage II pressure ulcer on the sacrum, present on admission  Discharge Condition: Stable  Diet recommendation: Low-sodium, diabetic  Filed Weights   06/01/20 2142 06/02/20 2132 06/03/20 2054  Weight: 75.4 kg 74.4 kg 74.8 kg    History of present illness:  64 year old female with history of polycystic disease status post transplant on chronic chemo immunosuppression therapy, COPD, chronic respiratory failure with hypoxia on 2 L of oxygen per minute at baseline, diastolic CHF, pulmonary hypertension who presents with shortness of breath.  She was just discharged from ER after being hospitalized from 6/21-6/30 for the management of sepsis.  She was found to have wheezing some chest discomfort, she denied cough.  On presentation she was febrile, tachycardic, tachypneic, had leukocytosis.  Chest x-ray did not show any acute abnormalities.   Hospital Course:   Sepsis like symptoms:  Fever of unknown origin Admitted yet again on 7/5 with fever-temp of 103, tachycardia, tachypnea -Chest x-ray without acute findings, UTI grew Proteus but no  symptoms of UTI -Patient has a very small chronic sacral ulcer without any surrounding signs or symptoms of infection,, recent MRI on 6/23 was negative for abscess, osteomyelitis, recent CT chest/abd pelvis negative too, dopplers negative -Frequent hospitalizations for same, this is the 11th admission for similar symptoms in 55month, each time with fever, sepsis type symptoms with negative work-up  -Immunocompromised state puts her at risk of opportunistic infections,, work-up unrevealing thus far -Infectious disease consult, antibiotics discontinued and transition back to home dose of prednisone at 7.5 mg daily -She remained stable for 5days off antibiotics and on home dose of prednisone  -Peripheral smear unremarkable, SPEP showed very mild M spike, immunofixation pending -Bone marrow aspiration biopsy completed on 7/9  as part of FUO work-up, AFB is negative -Follow-up with infectious disease Dr. CLinus Salmons virtual visit set up for 7/28 to follow-up bone marrow biopsy results, fungal cultures etc.  COPD -Chronic respiratory failure on 2 L home O2 at baseline -No wheezing, I think her tachypnea was in the setting of fever -Chest x-ray unremarkable, continue nebs prn  Chronic diastolic congestive heart failure:  -last echocardiogram showed ejection fraction of 502% grade 2 diastolic dysfunction.  -Clinically euvolemic  Hyponatremia:  -Improved, hydrated with saline  History of SVT:  - Continue verapamil 240 mg daily.  Hyperglycemia:  - Last hemoglobin A1c of 5.9 as per 04/29/2020.  Monitor CBGs.  History of kidney transplant:  -On chronic immunosuppressive therapy, continue home regimen  Anemia of chronic disease: Currently H&H stable.  History with CKD  History of pulmonary hypertension:: On supplemental oxygen at baseline.  Sacral ulcer: Has a stage II pressure ulcer on the sacrum.  Present on admission.  MRI done on 6/1 3 did not show any signs of abscess or osteomyelitis.   Frequent turning. Ulcer does not look infected.  Procedures:  Bone marrow aspiration and biopsy  Consultations:  Infectious disease  Discharge Exam: Vitals:   06/04/20 0755 06/04/20 0920  BP:  119/60  Pulse:  (!) 105  Resp:  18  Temp:  98.8 F (37.1 C)  SpO2: 100% 100%    General: Awake alert, oriented x3, chronically ill pleasant female Cardiovascular: S1-S2, regular rate rhythm Respiratory: Poor air movement bilaterally, overall  Discharge Instructions   Discharge Instructions    Diet - low sodium heart healthy   Complete by: As directed    Diet Carb Modified   Complete by: As directed    Increase activity slowly   Complete by: As directed    No wound care   Complete by: As directed      Allergies as of 06/04/2020      Reactions   Infed [iron Dextran] Other (See Comments)   Chest tightness   Pentamidine Itching, Shortness Of Breath, Swelling   Erythromycin [erythromycin] Other (See Comments)   Mouth Ulcers   Iohexol Other (See Comments)   Per patient "she has had a kidney transplant and should never have contrast"   Oxycodone Nausea Only, Nausea And Vomiting   Erythromycin Rash   Causes breakout in mouth   Ultram [tramadol Hcl] Anxiety      Medication List    STOP taking these medications   docusate sodium 100 MG capsule Commonly known as: COLACE   feeding supplement (ENSURE ENLIVE) Liqd   Symbicort 160-4.5 MCG/ACT inhaler Generic drug: budesonide-formoterol     TAKE these medications   acetaminophen 325 MG tablet Commonly known as: TYLENOL Take 2 tablets (650 mg total) by mouth every 6 (six) hours as needed for mild pain (or Fever >/= 101).   albuterol 108 (90 Base) MCG/ACT inhaler Commonly known as: VENTOLIN HFA Inhale 2 puffs into the lungs every 4 (four) hours as needed for wheezing or shortness of breath.   allopurinol 300 MG tablet Commonly known as: ZYLOPRIM Take 1 tablet (300 mg total) by mouth daily.   ascorbic acid 500 MG  tablet Commonly known as: VITAMIN C Take 500 mg by mouth daily.   aspirin 81 MG chewable tablet Chew 81 mg by mouth daily.   brimonidine 0.1 % Soln Commonly known as: ALPHAGAN P Place 1 drop into both eyes 2 (two) times daily.   budesonide 1 MG/2ML nebulizer solution Commonly known as: PULMICORT Take 1 mg by nebulization in the morning and at bedtime. What changed: Another medication with the same name was removed. Continue taking this medication, and follow the directions you see here.   cholecalciferol 25 MCG (1000 UNIT) tablet Commonly known as: VITAMIN D3 Take 1 tablet (1,000 Units total) by mouth daily.   collagenase ointment Commonly known as: SANTYL Apply 1 application topically daily. Sacral wound   cyanocobalamin 1000 MCG tablet Take 1,000 mcg by mouth daily.   cycloSPORINE 0.05 % ophthalmic emulsion Commonly known as: RESTASIS Place 1 drop into both eyes 2 (two) times daily.   dextromethorphan-guaiFENesin 30-600 MG 12hr tablet Commonly known as: MUCINEX DM Take 1 tablet by mouth 2 (two) times daily as needed for cough.   dorzolamide 2 % ophthalmic solution Commonly known as: TRUSOPT Place 1 drop into both eyes daily.   ferrous sulfate 325 (65 FE) MG tablet Take 325 mg by mouth daily with breakfast.   FLUoxetine  40 MG capsule Commonly known as: PROZAC Take 1 capsule (40 mg total) by mouth daily.   furosemide 40 MG tablet Commonly known as: LASIX Take 1 tablet (40 mg total) by mouth daily.   gabapentin 100 MG capsule Commonly known as: NEURONTIN Take 200 mg by mouth 3 (three) times daily.   guaiFENesin 600 MG 12 hr tablet Commonly known as: MUCINEX Take 1 tablet (600 mg total) by mouth 2 (two) times daily.   ipratropium 17 MCG/ACT inhaler Commonly known as: ATROVENT HFA Inhale 1 puff into the lungs every 6 (six) hours. For COPD   isosorbide mononitrate 60 MG 24 hr tablet Commonly known as: IMDUR Take 1 tablet (60 mg total) by mouth daily.    levalbuterol 0.63 MG/3ML nebulizer solution Commonly known as: XOPENEX Take 0.63 mg by nebulization See admin instructions. Inhale 0.'63mg'$  once daily.  May take 0.'63mg'$  every six hours as needed COPD.   magnesium oxide 400 MG tablet Commonly known as: MAG-OX Take 1 tablet (400 mg total) by mouth 2 (two) times daily.   multivitamin with minerals Tabs tablet Take 1 tablet by mouth daily.   mycophenolate 180 MG EC tablet Commonly known as: MYFORTIC Take 180 mg by mouth 2 (two) times daily.   NovoLOG 100 UNIT/ML injection Generic drug: insulin aspart Inject 2-12 Units into the skin See admin instructions. Per sliding scale subcutaneous, once a day - If blood sugar is: 0 - 200: 0 units  201 - 250: 2 units  251 - 300: 4 units  301 - 350: 6 units  351 - 400: 8 units  401 - 450: 10 units  451 - 500: 12 units  500 or greater: Call MD and give 14 units, then recheck blood sugar in 2 hours, at that time if blood sugar is greater than 400 or less than 80, call PEC TRIAGE.   pantoprazole 40 MG tablet Commonly known as: PROTONIX Take 1 tablet (40 mg total) by mouth 2 (two) times daily.   predniSONE 2.5 MG tablet Commonly known as: DELTASONE Take 2.5 mg by mouth daily with breakfast. Takes with '5mg'$  tablet for total dose of 7.5 mg What changed: Another medication with the same name was changed. Make sure you understand how and when to take each.   predniSONE 5 MG tablet Commonly known as: DELTASONE Take 1.5 tablets (7.5 mg total) by mouth daily with breakfast. What changed:   how much to take  additional instructions   sennosides-docusate sodium 8.6-50 MG tablet Commonly known as: SENOKOT-S Take 2 tablets by mouth daily.   tacrolimus 0.5 MG capsule Commonly known as: PROGRAF Take 0.5 mg by mouth at bedtime. Takes brand name Prograf   Prograf 1 MG capsule Generic drug: tacrolimus Take 1 mg by mouth daily.   Travatan Z 0.004 % Soln ophthalmic solution Generic drug: Travoprost  (BAK Free) Place 1 drop into both eyes at bedtime.   valACYclovir 500 MG tablet Commonly known as: VALTREX Take 500 mg by mouth 2 (two) times daily.   verapamil 240 MG CR tablet Commonly known as: CALAN-SR Take 240 mg by mouth daily. What changed: Another medication with the same name was removed. Continue taking this medication, and follow the directions you see here.      Allergies  Allergen Reactions  . Infed [Iron Dextran] Other (See Comments)    Chest tightness  . Pentamidine Itching, Shortness Of Breath and Swelling  . Erythromycin [Erythromycin] Other (See Comments)    Mouth Ulcers  . Iohexol  Other (See Comments)    Per patient "she has had a kidney transplant and should never have contrast"  . Oxycodone Nausea Only and Nausea And Vomiting  . Erythromycin Rash    Causes breakout in mouth  . Ultram [Tramadol Hcl] Anxiety    Follow-up Information    [name], MD. Schedule an appointment as soon as possible for a visit in 1 week(s).   Specialty: Family Medicine Contact information: [place] [address] [phone]        Thayer Headings, MD Follow up on 06/19/2020.   Specialty: Infectious Diseases Why: Virtual Visit to discuss results of Bone Marrow Biopsy Contact information: [address] [place] [address] [phone]                The results of significant diagnostics from this hospitalization (including imaging, microbiology, ancillary and laboratory) are listed below for reference.    Significant Diagnostic Studies: CT CHEST WO CONTRAST  Result Date: 05/14/2020 CLINICAL DATA:  64 year old female with shortness of breath and chest pain. EXAM: CT CHEST WITHOUT CONTRAST TECHNIQUE: Multidetector CT imaging of the chest was performed following the standard protocol without IV contrast. COMPARISON:  Chest radiograph dated 04/29/2020. FINDINGS: Evaluation of this exam is limited in the absence of intravenous  contrast. Cardiovascular: There is mild cardiomegaly. No pericardial effusion. Coronary vascular calcification. There is mild atherosclerotic calcification of the thoracic aorta. There is mild dilatation of the main pulmonary trunk suggestive of pulmonary hypertension. Clinical correlation is recommended. Evaluation of the vasculature is limited in the absence of intravenous contrast. Mediastinum/Nodes: No hilar or mediastinal adenopathy. The esophagus and the thyroid gland are grossly unremarkable. No mediastinal fluid collection. Lungs/Pleura: Small bilateral pleural effusions similar to prior CT with associated partial compressive atelectasis of the lower lobes. Pneumonia is not excluded. Clinical correlation is recommended. There are bibasilar linear atelectasis/scarring. No pneumothorax. The central airways are patent. Upper Abdomen: Please see the report for the accompanying CT abdomen pelvis. Musculoskeletal: No chest wall mass or suspicious bone lesions identified. IMPRESSION: 1. Small bilateral pleural effusions with associated partial compressive atelectasis of the lower lobes. Pneumonia is not excluded. Clinical correlation is recommended. 2. Mild cardiomegaly with coronary vascular calcification. 3. Mildly dilated main pulmonary trunk suggestive of pulmonary hypertension. Clinical correlation is recommended. 4. Aortic Atherosclerosis (ICD10-I70.0). Electronically Signed   By: Anner Crete M.D.   On: 05/14/2020 21:51   NM Pulmonary Perfusion  Result Date: 05/14/2020 CLINICAL DATA:  Hypoxemia with elevated D-dimer. EXAM: NUCLEAR MEDICINE PERFUSION LUNG SCAN TECHNIQUE: Perfusion images were obtained in multiple projections after intravenous injection of radiopharmaceutical. Ventilation scans intentionally deferred if perfusion scan and chest x-ray adequate for interpretation during COVID 19 epidemic. RADIOPHARMACEUTICALS:  1.5 mCi Tc-5mMAA IV COMPARISON:  None. FINDINGS: There is normal  homogeneous distribution of tracer activity seen throughout both lungs. No segmental or subsegmental perfusion defects are identified. IMPRESSION: Normal nuclear medicine perfusion lung scan. Electronically Signed   By: TVirgina NorfolkM.D.   On: 05/14/2020 19:46   CT ABDOMEN PELVIS W CONTRAST  Result Date: 05/14/2020 CLINICAL DATA:  64year old female with abdominal pain. EXAM: CT ABDOMEN AND PELVIS WITH CONTRAST TECHNIQUE: Multidetector CT imaging of the abdomen and pelvis was performed using the standard protocol following bolus administration of intravenous contrast. CONTRAST:  103mOMNIPAQUE IOHEXOL 300 MG/ML  SOLN COMPARISON:  CT abdomen pelvis dated 04/15/2020. FINDINGS: Lower chest: Partially visualized small bilateral pleural effusions with associated bibasilar atelectasis. Please see report  for chest CT. No intra-abdominal free air or free fluid. Hepatobiliary: Multiple hepatic hypodense lesions measure up to 3 cm. The larger lesions demonstrate fluid attenuation most consistent with cysts and smaller lesions are not well characterized. No calcified gallstone. There is a small pericholecystic fluid. Pancreas: Unremarkable. No pancreatic ductal dilatation or surrounding inflammatory changes. Spleen: Top-normal spleen size measuring up to 14 cm in craniocaudal length. Adrenals/Urinary Tract: The adrenal glands unremarkable. Innumerable bilateral renal cysts essentially replacing the renal parenchyma consistent with polycystic kidney disease. There is no hydronephrosis on either side. There is a right lower quadrant renal transplant. There is no hydronephrosis or nephrolithiasis of the transplant kidney. No peritransplant fluid collection. The urinary bladder is decompressed around a Foley catheter. Stomach/Bowel: There is moderate stool throughout the colon. There is no bowel obstruction or active inflammation. The appendix is normal. Vascular/Lymphatic: Mild aortoiliac atherosclerotic disease. The  IVC is unremarkable. No portal venous gas. There is no adenopathy. Reproductive: The uterus is retroflexed. No adnexal masses. Other: Small fat containing umbilical hernia. Mild diffuse subcutaneous edema. Musculoskeletal: Osteopenia with degenerative changes of the spine. Total left hip arthroplasty. Old left pubic bone fracture as well as old sacral fractures. No acute osseous pathology. IMPRESSION: 1. No acute intra-abdominal or pelvic pathology. 2. Polycystic kidney disease with a right lower quadrant renal transplant. No hydronephrosis or nephrolithiasis. 3. No bowel obstruction. Normal appendix. 4. Small bilateral pleural effusions with associated bibasilar atelectasis. Please see report for chest CT. 5. Aortic Atherosclerosis (ICD10-I70.0). Electronically Signed   By: Anner Crete M.D.   On: 05/14/2020 21:25   MR SACRUM SI JOINTS WO CONTRAST  Result Date: 05/15/2020 CLINICAL DATA:  Chronic sacral wound with recurrent fevers. Evaluate for osteomyelitis. EXAM: MRI SACRUM WITHOUT CONTRAST TECHNIQUE: Multiplanar, multisequence MR imaging of the sacrum was performed. No intravenous contrast was administered. COMPARISON:  CT abdomen pelvis from yesterday. FINDINGS: Bones/Joint/Cartilage No suspicious marrow signal abnormality. No acute fracture or dislocation. Chronic bilateral sacral ala and S2 fractures. Prior left total hip arthroplasty with associated susceptibility artifact. Muscles and Tendons Bilateral piriformis, gluteal, and adductor muscle edema and atrophy. Soft tissue Left-sided sacrococcygeal ulcer. No fluid collection or hematoma. No soft tissue mass. Trace free fluid in the pelvis.  Fibroid uterus. IMPRESSION: 1. Left-sided sacrococcygeal ulcer without evidence of osteomyelitis or abscess. Electronically Signed   By: Titus Dubin M.D.   On: 05/15/2020 16:49   DG Chest Port 1 View  Result Date: 05/27/2020 CLINICAL DATA:  Chest pain and shortness of breath EXAM: PORTABLE CHEST 1 VIEW  COMPARISON:  May 16, 2020 FINDINGS: The heart size and mediastinal contours are stable. The heart size is enlarged. Both lungs are clear. The visualized skeletal structures are unremarkable. IMPRESSION: No active cardiopulmonary disease. Electronically Signed   By: Abelardo Diesel M.D.   On: 05/27/2020 11:05   DG Chest Port 1 View  Result Date: 05/16/2020 CLINICAL DATA:  Shortness of breath. EXAM: PORTABLE CHEST 1 VIEW COMPARISON:  One-view chest x-ray 05/14/2020. CT chest without contrast 05/14/2020. FINDINGS: The a heart size is normal. Mild pulmonary vascular congestion is noted. Small right pleural effusion and atelectasis is again noted. No new airspace disease is present. IMPRESSION: 1. Stable appearance of small right pleural effusion and atelectasis. 2. Mild pulmonary vascular congestion. Electronically Signed   By: San Morelle M.D.   On: 05/16/2020 08:06   Portable chest 1 View  Result Date: 05/14/2020 CLINICAL DATA:  Shortness of breath EXAM: PORTABLE CHEST 1 VIEW COMPARISON:  04/23/2020 FINDINGS:  Cardiac shadow is mildly prominent but stable accentuated by the portable technique. Overall inspiratory effort is again poor although previously seen atelectasis has improved. No focal confluent infiltrate or sizable effusion is noted. No bony abnormality is seen. IMPRESSION: No acute abnormality noted. Electronically Signed   By: Inez Catalina M.D.   On: 05/14/2020 09:28   DG Chest Portable 1 View  Result Date: 05/13/2020 CLINICAL DATA:  Short of breath chest pain EXAM: PORTABLE CHEST 1 VIEW COMPARISON:  04/29/2020 FINDINGS: Hypoventilation with decreased lung volume. Mild bibasilar atelectasis similar to the prior study Negative for heart failure or pneumonia. Small right pleural effusion. IMPRESSION: Hypoventilation with bibasilar atelectasis unchanged from the prior study Electronically Signed   By: Franchot Gallo M.D.   On: 05/13/2020 10:54   CT BONE MARROW BIOPSY  Result Date:  05/31/2020 CLINICAL DATA:  Renal failure.  Fever of unknown origin. EXAM: CT GUIDED DEEP ILIAC BONE ASPIRATION AND CORE BIOPSY TECHNIQUE: Patient was placed prone on the CT gantry and limited axial scans through the pelvis were obtained. Appropriate skin entry site was identified. Skin site was marked, prepped with chlorhexidine, draped in usual sterile fashion, and infiltrated locally with 1% lidocaine. Intravenous Fentanyl 57mg and Versed 1.'5mg'$  were administered as conscious sedation during continuous monitoring of the patient's level of consciousness and physiological / cardiorespiratory status by the radiology RN, with a total moderate sedation time of 13 minutes. Under CT fluoroscopic guidance an 11-gauge Cook trocar bone needle was advanced into the right iliac bone just lateral to the sacroiliac joint. Once needle tip position was confirmed, core and aspiration samples were obtained, submitted to pathology for approval. Additional core was sent for requested microbiologic analysis. Patient tolerated procedure well. COMPLICATIONS: COMPLICATIONS none IMPRESSION: 1. Technically successful CT guided right iliac bone core and aspiration biopsy. Electronically Signed   By: DLucrezia EuropeM.D.   On: 05/31/2020 12:22   VAS [place]OWER EXTREMITY VENOUS (DVT)  Result Date: 05/13/2020  Lower Venous DVTStudy Indications: SOB, and sepsis.  Comparison Study: 12/16/19, 02/02/20, 03/16/20 all negative for DVT Performing Technologist: JJune LeapRDMS, RVT  Examination Guidelines: A complete evaluation includes B-mode imaging, spectral Doppler, color Doppler, and power Doppler as needed of all accessible portions of each vessel. Bilateral testing is considered an integral part of a complete examination. Limited examinations for reoccurring indications may be performed as noted. The reflux portion of the exam is performed with the patient in reverse Trendelenburg.   +---------+---------------+---------+-----------+----------+--------------+ RIGHT    CompressibilityPhasicitySpontaneityPropertiesThrombus Aging +---------+---------------+---------+-----------+----------+--------------+ CFV      Full           Yes      Yes                                 +---------+---------------+---------+-----------+----------+--------------+ SFJ      Full                                                        +---------+---------------+---------+-----------+----------+--------------+ FV Prox  Full                                                        +---------+---------------+---------+-----------+----------+--------------+  FV Mid   Full                                                        +---------+---------------+---------+-----------+----------+--------------+ FV DistalFull                                                        +---------+---------------+---------+-----------+----------+--------------+ PFV      Full                                                        +---------+---------------+---------+-----------+----------+--------------+ POP      Full           Yes      Yes                                 +---------+---------------+---------+-----------+----------+--------------+ PTV      Full                                                        +---------+---------------+---------+-----------+----------+--------------+ PERO     Full                                                        +---------+---------------+---------+-----------+----------+--------------+   +---------+---------------+---------+-----------+----------+--------------+ LEFT     CompressibilityPhasicitySpontaneityPropertiesThrombus Aging +---------+---------------+---------+-----------+----------+--------------+ CFV      Full           Yes      Yes                                  +---------+---------------+---------+-----------+----------+--------------+ SFJ      Full                                                        +---------+---------------+---------+-----------+----------+--------------+ FV Prox  Full                                                        +---------+---------------+---------+-----------+----------+--------------+ FV Mid   Full                                                        +---------+---------------+---------+-----------+----------+--------------+  FV DistalFull                                                        +---------+---------------+---------+-----------+----------+--------------+ PFV      Full                                                        +---------+---------------+---------+-----------+----------+--------------+ POP      Full           Yes      Yes                                 +---------+---------------+---------+-----------+----------+--------------+ PTV      Full                                                        +---------+---------------+---------+-----------+----------+--------------+ PERO     Full                                                        +---------+---------------+---------+-----------+----------+--------------+     Summary: BILATERAL: - No evidence of deep vein thrombosis seen in the lower extremities, bilaterally. -No evidence of popliteal cyst, bilaterally.   *See table(s) above for measurements and observations. Electronically signed by Ruta Hinds MD on 05/13/2020 at 3:35:07 PM.    Final     Microbiology: Recent Results (from the past 240 hour(s))  Urine culture     Status: Abnormal   Collection Time: 05/27/20 10:07 AM   Specimen: In/Out Cath Urine  Result Value Ref Range Status   Specimen Description IN/OUT CATH URINE  Final   Special Requests   Final    NONE Performed at [facility], [address] [place], [place] [address]     Culture >=100,000 COLONIES/mL PROTEUS MIRABILIS (A)  Final   Report Status 05/29/2020 FINAL  Final   Organism ID, Bacteria PROTEUS MIRABILIS (A)  Final      Susceptibility   Proteus mirabilis - MIC*    AMPICILLIN <=2 SENSITIVE Sensitive     CEFAZOLIN <=4 SENSITIVE Sensitive     CEFTRIAXONE <=0.25 SENSITIVE Sensitive     CIPROFLOXACIN >=4 RESISTANT Resistant     GENTAMICIN <=1 SENSITIVE Sensitive     IMIPENEM 2 SENSITIVE Sensitive     NITROFURANTOIN 128 RESISTANT Resistant     TRIMETH/SULFA <=20 SENSITIVE Sensitive     AMPICILLIN/SULBACTAM <=2 SENSITIVE Sensitive     PIP/TAZO <=4 SENSITIVE Sensitive     * >=100,000 COLONIES/mL PROTEUS MIRABILIS  Blood Culture (routine x 2)     Status: None   Collection Time: 05/27/20 10:12 AM   Specimen: BLOOD  Result Value Ref Range Status   Specimen Description BLOOD RIGHT ANTECUBITAL  Final   Special Requests   Final  BOTTLES DRAWN AEROBIC AND ANAEROBIC Blood Culture results may not be optimal due to an inadequate volume of blood received in culture bottles   Culture   Final    NO GROWTH 5 DAYS Performed at [facility], [address], [place], [place] [address]    Report Status 06/01/2020 FINAL  Final  Blood Culture (routine x 2)     Status: None   Collection Time: 05/27/20 10:30 AM   Specimen: BLOOD RIGHT HAND  Result Value Ref Range Status   Specimen Description BLOOD RIGHT HAND  Final   Special Requests   Final    BOTTLES DRAWN AEROBIC ONLY Blood Culture results may not be optimal due to an inadequate volume of blood received in culture bottles   Culture   Final    NO GROWTH 5 DAYS Performed at [facility], [address], [place], [place] [address]    Report Status 06/01/2020 FINAL  Final  SARS Coronavirus 2 by RT PCR (hospital order, performed in [facility] hospital lab) Nasopharyngeal Nasopharyngeal Swab     Status: None   Collection Time: 05/27/20 11:18 AM   Specimen: Nasopharyngeal Swab  Result Value Ref Range  Status   SARS Coronavirus 2 NEGATIVE NEGATIVE Final    Comment: (NOTE) SARS-CoV-2 target nucleic acids are NOT DETECTED.  The SARS-CoV-2 RNA is generally detectable in upper and lower respiratory specimens during the acute phase of infection. The lowest concentration of SARS-CoV-2 viral copies this assay can detect is 250 copies / mL. A negative result does not preclude SARS-CoV-2 infection and should not be used as the sole basis for treatment or other patient management decisions.  A negative result may occur with improper specimen collection / handling, submission of specimen other than nasopharyngeal swab, presence of viral mutation(s) within the areas targeted by this assay, and inadequate number of viral copies (<250 copies / mL). A negative result must be combined with clinical observations, patient history, and epidemiological information.  Fact Sheet for Patients:   BoilerBrush.com.cy  Fact Sheet for Healthcare Providers: https://pope.com/  This test is not yet approved or  cleared by the [place] FDA and has been authorized for detection and/or diagnosis of SARS-CoV-2 by FDA under an Emergency Use Authorization (EUA).  This EUA will remain in effect (meaning this test can be used) for the duration of the COVID-19 declaration under Section 564(b)(1) of the Act, 21 U.S.C. section 360bbb-3(b)(1), unless the authorization is terminated or revoked sooner.  Performed at [facility], [address], [place], [place] [address]   MRSA PCR Screening     Status: None   Collection Time: 05/27/20  8:45 PM   Specimen: Nasal Mucosa; Nasopharyngeal  Result Value Ref Range Status   MRSA by PCR NEGATIVE NEGATIVE Final    Comment:        The GeneXpert MRSA Assay (FDA approved for NASAL specimens only), is one component of a comprehensive MRSA colonization surveillance program. It is not intended to diagnose MRSA infection  nor to guide or monitor treatment for MRSA infections. Performed at [facility], [address], [place], [place] [address]   Fungus culture, blood     Status: None (Preliminary result)   Collection Time: 05/30/20 10:28 AM   Specimen: BLOOD RIGHT HAND  Result Value Ref Range Status   Specimen Description BLOOD RIGHT HAND  Final   Special Requests   Final    BOTTLES DRAWN AEROBIC AND ANAEROBIC Blood Culture  adequate volume   Culture   Final    NO GROWTH 5 DAYS Performed at [facility], [place] [address] [place], [place] [address]    Report Status PENDING  Incomplete  Acid Fast Smear (AFB)     Status: None   Collection Time: 05/31/20  9:24 AM   Specimen: Bone Biopsy; Tissue  Result Value Ref Range Status   AFB Specimen Processing Comment  Final    Comment: Tissue Grinding and Digestion/Decontamination   Acid Fast Smear Negative  Final    Comment: (NOTE) Performed At: [facility] [place], [place] [id number] Rush Farmer MD LJ:[id number]    Source (AFB) BONE MARROW  Final    Comment: Performed at [facility], [place] [address] [place], [place] [address]  SARS CORONAVIRUS 2 (TAT 6-24 HRS) Nasopharyngeal Nasopharyngeal Swab     Status: None   Collection Time: 06/03/20 12:22 PM   Specimen: Nasopharyngeal Swab  Result Value Ref Range Status   SARS Coronavirus 2 NEGATIVE NEGATIVE Final    Comment: (NOTE) SARS-CoV-2 target nucleic acids are NOT DETECTED.  The SARS-CoV-2 RNA is generally detectable in upper and lower respiratory specimens during the acute phase of infection. Negative results do not preclude SARS-CoV-2 infection, do not rule out co-infections with other pathogens, and should not be used as the sole basis for treatment or other patient management decisions. Negative results must be combined with clinical observations, patient history, and epidemiological information. The expected result is Negative.  Fact Sheet for  Patients: SugarRoll.be  Fact Sheet for Healthcare Providers: https://www.woods-mathews.com/  This test is not yet approved or cleared by the [place] FDA and  has been authorized for detection and/or diagnosis of SARS-CoV-2 by FDA under an Emergency Use Authorization (EUA). This EUA will remain  in effect (meaning this test can be used) for the duration of the COVID-19 declaration under Se ction 564(b)(1) of the Act, 21 U.S.C. section 360bbb-3(b)(1), unless the authorization is terminated or revoked sooner.  Performed at [facility], [place] [address], [place], [place] [address]      Labs: Basic Metabolic Panel: Recent Labs  Lab 05/31/20 0615 06/01/20 0723 06/02/20 0451 06/03/20 0438 06/04/20 0556  NA 139 140 143 138 136  K 4.8 4.6 4.4 4.4 4.7  CL 109 108 111 106 102  CO2 '24 25 23 25 27  '$ GLUCOSE 122* 107* 115* 123* 93  BUN 53* 46* 36* 29* 24*  CREATININE 1.15* 1.01* 0.90 1.05* 0.95  CALCIUM 9.8 9.9 9.8 9.4 9.4   Liver Function Tests: Recent Labs  Lab 05/30/20 0824 05/31/20 0615 06/04/20 0556  AST '16 16 15  '$ ALT '16 16 12  '$ ALKPHOS 63 63 58  BILITOT 0.7 0.7 1.5*  PROT 6.5 6.2* 6.4*  ALBUMIN 2.4* 2.3* 2.4*   No results for input(s): LIPASE, AMYLASE in the last 168 hours. No results for input(s): AMMONIA in the last 168 hours. CBC: Recent Labs  Lab 05/29/20 0805 05/30/20 0824 05/31/20 0615 06/01/20 0723 06/02/20 0451 06/03/20 0438 06/04/20 0556  WBC 13.0*   < > 5.3 5.0 6.0 8.2 7.1  NEUTROABS 12.2*  --  4.1 3.7  --   --  5.5  HGB 8.6*   < > 8.5* 9.1* 8.5* 9.7* 8.9*  HCT 28.6*   < > 28.9* 30.8* 28.7* 31.5* 29.9*  MCV 102.9*   < > 105.9* 104.4* 103.6* 104.0* 103.5*  PLT 182   < > 152 140* 135* 155 158   < > =  values in this interval not displayed.   Cardiac Enzymes: No results for input(s): CKTOTAL, CKMB, CKMBINDEX, TROPONINI in the last 168 hours. BNP: BNP (last 3 results) Recent Labs    05/19/20 0412  05/20/20 0316 05/27/20 1020  BNP 269.2* 167.5* 183.3*    ProBNP (last 3 results) No results for input(s): PROBNP in the last 8760 hours.  CBG: Recent Labs  Lab 06/03/20 1133 06/03/20 1651 06/03/20 2052 06/04/20 0635 06/04/20 1131  GLUCAP 124* 110* 99 87 109*       Signed:  Domenic Polite MD.  [facility] 06/04/2020, 11:38 AM

## 2020-06-04 NOTE — TOC Progression Note (Signed)
Transition of Care [facility]) - Progression Note    Patient Details  Name: April [name] MRN: [id number] Date of Birth: 1956/07/12  Transition of Care [facility]) CM/SW Contact  Sharlet Salina Mila Homer, LCSW Phone Number: 06/04/2020, 5:08 PM  Clinical Narrative:   CSW received call from [place] (5:01 pm) and authorization information provided: Auth ID #[id number], effective 7/13 (SNF can chg. start date with [employer]). Reference number [id number]. Care Coordinator Cristobal Goldmann - ph [phone] and fax [phone]. Call made to [facility], admissions director at [facility] and informed her that auth received. Per Irine Seal, patient will discharge to [facility] on 7/14 as she is not at work.     Expected Discharge Plan: [place] (From [facility]) Barriers to Discharge: Ship broker (PT needs to see)  Expected Discharge Plan and Services Expected Discharge Plan: [place] (From [facility]) In-house Referral: Clinical Social Work   Post Acute Care Choice: [place] Living arrangements for the past 2 months: [place] Expected Discharge Date: 06/04/20                                     Social Determinants of Health (SDOH) Interventions    Readmission Risk Interventions Readmission Risk Prevention Plan 05/15/2020 04/19/2020 02/09/2020  Transportation Screening Complete Complete Complete  PCP or Specialist Appt within 3-5 Days - - -  Not Complete comments - - -  HRI or [place] or Home Care Consult comments - - -  Social Work Scientific laboratory technician for [place] Planning/Counseling - - -  SW consult not completed comments - - -  Palliative Care Screening - - -  Medication Review ([place]) Referral to Pharmacy Complete Referral to Pharmacy  PCP or Specialist appointment within 3-5 days of discharge Complete Complete Complete  HRI or Home Care Consult Complete Complete Complete  SW  Recovery Care/Counseling Consult Complete Complete -  Palliative Care Screening Not Applicable Not Applicable Complete  Skilled Nursing Facility Complete Complete Patient Refused  Some recent data might be hidden

## 2020-06-05 ENCOUNTER — Inpatient Hospital Stay (HOSPITAL_COMMUNITY): Payer: Medicare Other

## 2020-06-05 LAB — BASIC METABOLIC PANEL
Anion gap: 7 (ref 5–15)
BUN: 24 mg/dL — ABNORMAL HIGH (ref 8–23)
CO2: 26 mmol/L (ref 22–32)
Calcium: 9.1 mg/dL (ref 8.9–10.3)
Chloride: 100 mmol/L (ref 98–111)
Creatinine, Ser: 0.9 mg/dL (ref 0.44–1.00)
GFR calc Af Amer: >60 mL/min (ref >60)
GFR calc non Af Amer: >60 mL/min (ref >60)
Glucose, Bld: 134 mg/dL — ABNORMAL HIGH (ref 70–99)
Potassium: 4.6 mmol/L (ref 3.5–5.1)
Sodium: 133 mmol/L — ABNORMAL LOW (ref 135–145)

## 2020-06-05 LAB — GLUCOSE, CAPILLARY
Glucose-Capillary: 88 mg/dL (ref 70–99)
Glucose-Capillary: 112 mg/dL — ABNORMAL HIGH (ref 70–99)
Glucose-Capillary: 195 mg/dL — ABNORMAL HIGH (ref 70–99)
Glucose-Capillary: 240 mg/dL — ABNORMAL HIGH (ref 70–99)

## 2020-06-05 LAB — EBV AB TO VIRAL CAPSID AG PNL, IGG+IGM
EBV VCA IgG: >600 U/mL — ABNORMAL HIGH (ref 0.0–17.9)
EBV VCA IgM: <36 U/mL (ref 0.0–35.9)

## 2020-06-05 MED ORDER — PREDNISONE 20 MG PO TABS
40.0000 mg | ORAL_TABLET | Freq: Every day | ORAL | Status: DC
  Administered 2020-06-05 – 2020-06-06 (×2): 40 mg via ORAL
  Filled 2020-06-05 (×2): qty 2

## 2020-06-05 MED ORDER — LEVALBUTEROL HCL 0.63 MG/3ML IN NEBU: 0.6300 mg | INHALATION_SOLUTION | RESPIRATORY_TRACT | Status: DC | PRN

## 2020-06-05 MED ORDER — IPRATROPIUM BROMIDE 0.02 % IN SOLN: 0.5000 mg | RESPIRATORY_TRACT | Status: DC | PRN

## 2020-06-05 MED ORDER — TECHNETIUM TO 99M ALBUMIN AGGREGATED
3.9000 | Freq: Once | INTRAVENOUS | Status: AC | PRN
  Administered 2020-06-05: 3.9 via INTRAVENOUS

## 2020-06-05 MED ORDER — LEVALBUTEROL HCL 0.63 MG/3ML IN NEBU: 0.6300 mg | INHALATION_SOLUTION | Freq: Four times a day (QID) | RESPIRATORY_TRACT | Status: DC

## 2020-06-05 MED ORDER — IPRATROPIUM BROMIDE 0.02 % IN SOLN: 0.5000 mg | Freq: Four times a day (QID) | RESPIRATORY_TRACT | Status: DC

## 2020-06-05 MED ORDER — IPRATROPIUM BROMIDE 0.02 % IN SOLN
0.5000 mg | Freq: Four times a day (QID) | RESPIRATORY_TRACT | Status: DC
  Administered 2020-06-05 – 2020-06-06 (×4): 0.5 mg via RESPIRATORY_TRACT
  Filled 2020-06-05 (×4): qty 2.5

## 2020-06-05 NOTE — Progress Notes (Signed)
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 Lorenso Courier as an outpatient at our cancer center at [facility] on 06/10/2020 at 1 PM.

## 2020-06-05 NOTE — Progress Notes (Deleted)
Found patient kneeling down on the floor with pillows on his knees as his knees cushion,his upper body was leaning down on the bed. Nurse asked him if he slipped down or fallen from the bed.Patient said ''I ll did it all by myself,its more comfortable to me, look on my ball man,checked it.I assessed his swollen scrotum.Patient said' its getting bigger and bigger.I told him that doctor is aware.Patient said ''i 'll be waiting for them to come up here on this way". Will monitor.

## 2020-06-05 NOTE — Plan of Care (Signed)
  Problem: Education: Goal: Knowledge of General Education information will improve Description Including pain rating scale, medication(s)/side effects and non-pharmacologic comfort measures Outcome: Progressing   Problem: Health Behavior/Discharge Planning: Goal: Ability to manage health-related needs will improve Outcome: Progressing   

## 2020-06-05 NOTE — Progress Notes (Signed)
PROGRESS NOTE    April [name]  UPJ:[id number] DOB: 1956-11-14 DOA: 05/27/2020 PCP: [name], MD   Brief Narrative: 64-year-old female with past medical history significant for polycystic disease status post kidney transplant on chronic immunosuppressive therapy, COPD, chronic respiratory failure with hypoxemia on 2 L of oxygen at home, diastolic heart failure, pulmonary hypertension who presented with shortness of breath.  Patient was recently admitted on 6/21--- 6/30 for management of sepsis.  On presentation she was febrile, tachycardic tachypneic and had leukocytosis.  Chest x-ray did not show any acute abnormalities.   Assessment & Plan:   Active Problems:   Renal transplant recipient   Chronic diastolic CHF (congestive heart failure) (HCC)   Hyponatremia   Sepsis ([place])   Anemia of chronic disease   Acute on chronic respiratory failure with hypoxia (HCC)   COPD with acute exacerbation (HCC)   FUO (fever of unknown origin)   History of immunosuppressive therapy   1-SIRS, fever of unknown origin, sepsis was ruled out: Present with fever, tachycardia tachypnea.  Chest x-ray negative for acute finding.  Urine grew Proteus but no symptoms of UTI. Patient had a very small chronic sacral stage II without any surrounding signs or symptoms of infection, recent MRI on 6/23 was negative for abscess, osteomyelitis, recent CT chest abdomen and pelvis negative. Frequent hospitalization for the same, this is 11 admission so far for similar symptoms. Patient is disease consulted, antibiotic discontinue She remained stable for 5 days off of antibiotics SPEP showed very mild M spike.  Bone marrow biopsy showed 11% plasma cell kappa light chain.  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  Appointment is scheduled with the cancer center on July 19 -Patient will need to follow-up with infectious disease Dr. Linus Salmons, we are 12 visits set up for  7/28  2-Acute COPD, chronic respiratory failure on 2 L of oxygen Report worsening shortness of breath and chest tightness today. Plan to resume prednisone, she was off of prednisone and not receive low-dose prednisone for the last 2 days. Schedule nebulizer treatments again. Report chest tightness, tachycardic, will check VQ scan,.   3-Chronic diastolic heart failure: Euvolemic 4-hyponatremia; 5-History of SVT: Continue with verapamil 6-History of kidney transplant: -Patient did not receive prednisone for the last 2 days.  Will resume prednisone at higher dose due to concern for COPD exacerbation. -Need to make sure patient after taper of prednisone resume her chronic dose of prednisone 7.5 mg Continue with other immunosuppressive therapy. Function continue to be stable  History of pulmonary hypertension: Continue with home oxygen supplementation Sacral Ulcer stage 2.  Present on admission, MRI negative for abscess or osteomyelitis.  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 Plan to follow-up with oncology  Pressure Injury 05/27/20 Coccyx Mid Stage 3 -  Full thickness tissue loss. Subcutaneous fat may be visible but bone, tendon or muscle are NOT exposed. (Active)  05/27/20 1918  Location: Coccyx  Location Orientation: Mid  Staging: Stage 3 -  Full thickness tissue loss. Subcutaneous fat may be visible but bone, tendon or muscle are NOT exposed.  Wound Description (Comments):   Present on Admission: Yes     Pressure Injury 05/27/20 Heel Right;Left Stage 1 -  Intact skin with non-blanchable redness of a localized area usually over a bony prominence. (Active)  05/27/20 1918  Location: Heel  Location Orientation: Right;Left  Staging: Stage 1 -  Intact skin with non-blanchable redness of a localized area  usually over a bony prominence.  Wound Description (Comments):   Present on Admission: Yes                   Estimated body mass index is 31.16 kg/m as calculated from the following:   Height as of this encounter: 5' 1"  (1.549 m).   Weight as of this encounter: 74.8 kg.   DVT prophylaxis: Lovenox Code Status: Full code Family Communication: Care discussed with patient Disposition Plan:  Status is: Inpatient  Remains inpatient appropriate because:Hemodynamically unstable   Dispo: The patient is from: Home              Anticipated d/c is to: SNF              Anticipated d/c date is: 1 day              Patient currently is not medically stable to d/c.        Consultants:   ID    Procedures:   BM Biopsy   Antimicrobials:    Subjective: Report chest tightness and SOB similar symptoms on admission, but not that severe.    Objective: Vitals:   06/04/20 2142 06/05/20 0441 06/05/20 0744 06/05/20 0858  BP:  (!) 124/51  131/65  Pulse:  (!) 101  (!) 107  Resp:  20  18  Temp:  99.1 F (37.3 C)  98.1 F (36.7 C)  TempSrc:  Oral  Oral  SpO2: 99% 100% 100% 93%  Weight:      Height:        Intake/Output Summary (Last 24 hours) at 06/05/2020 1536 Last data filed at 06/05/2020 1300 Gross per 24 hour  Intake 480 ml  Output 300 ml  Net 180 ml   Filed Weights   06/02/20 2132 06/03/20 2054 06/04/20 2100  Weight: 74.4 kg 74.8 kg 74.8 kg    Examination:  General exam: Appears calm and comfortable  Respiratory system: Clear to auscultation. Respiratory effort normal. Cardiovascular system: S1 & S2 heard, RRR. No JVD, murmurs, rubs, gallops or clicks. No pedal edema. Gastrointestinal system: Abdomen is nondistended, soft and nontender. No organomegaly or masses felt. Normal bowel sounds heard. Central nervous system: Alert and oriented. No focal neurological deficits. Extremities: Symmetric 5 x 5 power.    Data Reviewed: I have personally reviewed following labs and imaging studies  CBC: Recent Labs  Lab 05/31/20 0615 06/01/20 0723 06/02/20 0451 06/03/20 0438  06/04/20 0556  WBC 5.3 5.0 6.0 8.2 7.1  NEUTROABS 4.1 3.7  --   --  5.5  HGB 8.5* 9.1* 8.5* 9.7* 8.9*  HCT 28.9* 30.8* 28.7* 31.5* 29.9*  MCV 105.9* 104.4* 103.6* 104.0* 103.5*  PLT 152 140* 135* 155 412   Basic Metabolic Panel: Recent Labs  Lab 06/01/20 0723 06/02/20 0451 06/03/20 0438 06/04/20 0556 06/05/20 1033  NA 140 143 138 136 133*  K 4.6 4.4 4.4 4.7 4.6  CL 108 111 106 102 100  CO2 25 23 25 27 26   GLUCOSE 107* 115* 123* 93 134*  BUN 46* 36* 29* 24* 24*  CREATININE 1.01* 0.90 1.05* 0.95 0.90  CALCIUM 9.9 9.8 9.4 9.4 9.1   GFR: Estimated Creatinine Clearance: 59.2 mL/min (by C-G formula based on SCr of 0.9 mg/dL). Liver Function Tests: Recent Labs  Lab 05/30/20 0824 05/31/20 0615 06/04/20 0556  AST 16 16 15   ALT 16 16 12   ALKPHOS 63 63 58  BILITOT 0.7 0.7 1.5*  PROT 6.5 6.2* 6.4*  ALBUMIN 2.4* 2.3* 2.4*   No results for input(s): LIPASE, AMYLASE in the last 168 hours. No results for input(s): AMMONIA in the last 168 hours. Coagulation Profile: No results for input(s): INR, PROTIME in the last 168 hours. Cardiac Enzymes: No results for input(s): CKTOTAL, CKMB, CKMBINDEX, TROPONINI in the last 168 hours. BNP (last 3 results) No results for input(s): PROBNP in the last 8760 hours. HbA1C: No results for input(s): HGBA1C in the last 72 hours. CBG: Recent Labs  Lab 06/04/20 1131 06/04/20 1623 06/04/20 2126 06/05/20 0640 06/05/20 1112  GLUCAP 109* 117* 110* 88 112*   Lipid Profile: No results for input(s): CHOL, HDL, LDLCALC, TRIG, CHOLHDL, LDLDIRECT in the last 72 hours. Thyroid Function Tests: No results for input(s): TSH, T4TOTAL, FREET4, T3FREE, THYROIDAB in the last 72 hours. Anemia Panel: No results for input(s): VITAMINB12, FOLATE, FERRITIN, TIBC, IRON, RETICCTPCT in the last 72 hours. Sepsis Labs: Recent Labs  Lab 05/31/20 0615  PROCALCITON 0.22    Recent Results (from the past 240 hour(s))  Urine culture     Status: Abnormal    Collection Time: 05/27/20 10:07 AM   Specimen: In/Out Cath Urine  Result Value Ref Range Status   Specimen Description IN/OUT CATH URINE  Final   Special Requests   Final    NONE Performed at [facility], [address], [place], [place] [address]    Culture >=100,000 COLONIES/mL PROTEUS MIRABILIS (A)  Final   Report Status 05/29/2020 FINAL  Final   Organism ID, Bacteria PROTEUS MIRABILIS (A)  Final      Susceptibility   Proteus mirabilis - MIC*    AMPICILLIN <=2 SENSITIVE Sensitive     CEFAZOLIN <=4 SENSITIVE Sensitive     CEFTRIAXONE <=0.25 SENSITIVE Sensitive     CIPROFLOXACIN >=4 RESISTANT Resistant     GENTAMICIN <=1 SENSITIVE Sensitive     IMIPENEM 2 SENSITIVE Sensitive     NITROFURANTOIN 128 RESISTANT Resistant     TRIMETH/SULFA <=20 SENSITIVE Sensitive     AMPICILLIN/SULBACTAM <=2 SENSITIVE Sensitive     PIP/TAZO <=4 SENSITIVE Sensitive     * >=100,000 COLONIES/mL PROTEUS MIRABILIS  Blood Culture (routine x 2)     Status: None   Collection Time: 05/27/20 10:12 AM   Specimen: BLOOD  Result Value Ref Range Status   Specimen Description BLOOD RIGHT ANTECUBITAL  Final   Special Requests   Final    BOTTLES DRAWN AEROBIC AND ANAEROBIC Blood Culture results may not be optimal due to an inadequate volume of blood received in culture bottles   Culture   Final    NO GROWTH 5 DAYS Performed at [facility], [place] [address], [place], [place] [address]    Report Status 06/01/2020 FINAL  Final  Blood Culture (routine x 2)     Status: None   Collection Time: 05/27/20 10:30 AM   Specimen: BLOOD RIGHT HAND  Result Value Ref Range Status   Specimen Description BLOOD RIGHT HAND  Final   Special Requests   Final    BOTTLES DRAWN AEROBIC ONLY Blood Culture results may not be optimal due to an inadequate volume of blood received in culture bottles   Culture   Final    NO GROWTH 5 DAYS Performed at [facility], [place] [address], [place], [place] [address]    Report  Status 06/01/2020 FINAL  Final  SARS Coronavirus 2 by RT PCR (hospital order, performed in [facility] hospital lab) Nasopharyngeal Nasopharyngeal Swab  Status: None   Collection Time: 05/27/20 11:18 AM   Specimen: Nasopharyngeal Swab  Result Value Ref Range Status   SARS Coronavirus 2 NEGATIVE NEGATIVE Final    Comment: (NOTE) SARS-CoV-2 target nucleic acids are NOT DETECTED.  The SARS-CoV-2 RNA is generally detectable in upper and lower respiratory specimens during the acute phase of infection. The lowest concentration of SARS-CoV-2 viral copies this assay can detect is 250 copies / mL. A negative result does not preclude SARS-CoV-2 infection and should not be used as the sole basis for treatment or other patient management decisions.  A negative result may occur with improper specimen collection / handling, submission of specimen other than nasopharyngeal swab, presence of viral mutation(s) within the areas targeted by this assay, and inadequate number of viral copies (<250 copies / mL). A negative result must be combined with clinical observations, patient history, and epidemiological information.  Fact Sheet for Patients:   StrictlyIdeas.no  Fact Sheet for Healthcare Providers: BankingDealers.co.za  This test is not yet approved or  cleared by the [place] FDA and has been authorized for detection and/or diagnosis of SARS-CoV-2 by FDA under an Emergency Use Authorization (EUA).  This EUA will remain in effect (meaning this test can be used) for the duration of the COVID-19 declaration under Section 564(b)(1) of the Act, 21 U.S.C. section 360bbb-3(b)(1), unless the authorization is terminated or revoked sooner.  Performed at [facility], [place] [address] [place], [place] [address]   MRSA PCR Screening     Status: None   Collection Time: 05/27/20  8:45 PM   Specimen: Nasal Mucosa; Nasopharyngeal  Result Value Ref  Range Status   MRSA by PCR NEGATIVE NEGATIVE Final    Comment:        The GeneXpert MRSA Assay (FDA approved for NASAL specimens only), is one component of a comprehensive MRSA colonization surveillance program. It is not intended to diagnose MRSA infection nor to guide or monitor treatment for MRSA infections. Performed at [facility], [place] [address], [place], [place] [address]   Fungus culture, blood     Status: None (Preliminary result)   Collection Time: 05/30/20 10:28 AM   Specimen: BLOOD RIGHT HAND  Result Value Ref Range Status   Specimen Description BLOOD RIGHT HAND  Final   Special Requests   Final    BOTTLES DRAWN AEROBIC AND ANAEROBIC Blood Culture adequate volume   Culture   Final    NO GROWTH 6 DAYS Performed at [facility], [place] [address] [place], [place] [address]    Report Status PENDING  Incomplete  Acid Fast Smear (AFB)     Status: None   Collection Time: 05/31/20  9:24 AM   Specimen: Bone Biopsy; Tissue  Result Value Ref Range Status   AFB Specimen Processing Comment  Final    Comment: Tissue Grinding and Digestion/Decontamination   Acid Fast Smear Negative  Final    Comment: (NOTE) Performed At: [facility] [place], [place] [id number] Rush Farmer MD YQ:[id number]    Source (AFB) BONE MARROW  Final    Comment: Performed at [facility], [place] [address], [place], [place] [address]  SARS CORONAVIRUS 2 (TAT 6-24 HRS) Nasopharyngeal Nasopharyngeal Swab     Status: None   Collection Time: 06/03/20 12:22 PM   Specimen: Nasopharyngeal Swab  Result Value Ref Range Status   SARS Coronavirus 2 NEGATIVE NEGATIVE Final    Comment: (NOTE) SARS-CoV-2 target nucleic acids  are NOT DETECTED.  The SARS-CoV-2 RNA is generally detectable in upper and lower respiratory specimens during the acute phase of infection. Negative results do not preclude SARS-CoV-2 infection, do not rule out co-infections with other pathogens,  and should not be used as the sole basis for treatment or other patient management decisions. Negative results must be combined with clinical observations, patient history, and epidemiological information. The expected result is Negative.  Fact Sheet for Patients: SugarRoll.be  Fact Sheet for Healthcare Providers: https://www.woods-mathews.com/  This test is not yet approved or cleared by the [place] FDA and  has been authorized for detection and/or diagnosis of SARS-CoV-2 by FDA under an Emergency Use Authorization (EUA). This EUA will remain  in effect (meaning this test can be used) for the duration of the COVID-19 declaration under Se ction 564(b)(1) of the Act, 21 U.S.C. section 360bbb-3(b)(1), unless the authorization is terminated or revoked sooner.  Performed at [facility], [place] [address], [place], [place] [address]          Radiology Studies: No results found.      Scheduled Meds: . allopurinol  300 mg Oral Daily  . arformoterol  15 mcg Nebulization BID  . aspirin  81 mg Oral Daily  . brimonidine  1 drop Both Eyes BID  . budesonide  1 mg Nebulization BID  . collagenase  1 application Topical Daily  . cycloSPORINE  1 drop Both Eyes BID  . dorzolamide  1 drop Both Eyes Daily  . enoxaparin (LOVENOX) injection  40 mg Subcutaneous Q24H  . ferrous sulfate  325 mg Oral Q breakfast  . FLUoxetine  40 mg Oral Daily  . gabapentin  200 mg Oral TID  . guaiFENesin  600 mg Oral BID  . insulin aspart  0-5 Units Subcutaneous QHS  . insulin aspart  0-9 Units Subcutaneous TID WC  . isosorbide mononitrate  60 mg Oral Daily  . latanoprost  1 drop Both Eyes QHS  . magnesium oxide  400 mg Oral BID  . multivitamin with minerals  1 tablet Oral Daily  . mycophenolate  180 mg Oral BID  . pantoprazole  40 mg Oral BID  . predniSONE  40 mg Oral Q breakfast  . senna-docusate  2 tablet Oral Daily  . sodium chloride flush  3 mL  Intravenous Q12H  . tacrolimus  0.5 mg Oral QHS  . tacrolimus  1 mg Oral Daily  . valACYclovir  500 mg Oral BID  . verapamil  240 mg Oral Daily  . cyanocobalamin  1,000 mcg Oral Daily   Continuous Infusions:   LOS: 9 days    Time spent: 35 minutes    Oluwaseyi Raffel A Zenaida Tesar, MD [facility]   If 7PM-7AM, please contact night-coverage [email or web address]  06/05/2020, 3:36 PM

## 2020-06-05 NOTE — Plan of Care (Signed)
°  Problem: Respiratory: °Goal: Ability to maintain adequate ventilation will improve °Outcome: Progressing °  °

## 2020-06-05 NOTE — TOC Progression Note (Signed)
Transition of Care [facility]) - Progression Note    Patient Details  Name: April [name] MRN: [id number] Date of Birth: [birth year]  Transition of Care [facility]) CM/SW Contact  Sharlet Salina Mila Homer, LCSW Phone Number: 06/05/2020, 11:17 AM  Clinical Narrative:  Insurance auth received 7/13 and MD advised this morning. Per MD, she plans to keep patient one more day and d/c on 7/15. Call made to [facility], admissions director at [facility] and HIPPA compliant message left. Patient informed of receipt of insurance authorization and 7/15 discharge.    Expected Discharge Plan: [place] (From [facility]) Barriers to Discharge: Ship broker (PT needs to see)  Expected Discharge Plan and Services Expected Discharge Plan: [place] (From [facility] [facility]) In-house Referral: Clinical Social Work   Post Acute Care Choice: [place] Living arrangements for the past 2 months: [place] Expected Discharge Date: 06/04/20                                   Social Determinants of Health (SDOH) Interventions  No SDOH interventions needed at this time  Readmission Risk Interventions Readmission Risk Prevention Plan 05/15/2020 04/19/2020 02/09/2020  Transportation Screening Complete Complete Complete  PCP or Specialist Appt within 3-5 Days - - -  Not Complete comments - - -  HRI or [place] or Home Care Consult comments - - -  Social Work Scientific laboratory technician for [place] Planning/Counseling - - -  SW consult not completed comments - - -  Palliative Care Screening - - -  Medication Review ([place]) Referral to Pharmacy Complete Referral to Pharmacy  PCP or Specialist appointment within 3-5 days of discharge Complete Complete Complete  HRI or Home Care Consult Complete Complete Complete  SW Recovery Care/Counseling Consult Complete Complete -  Palliative Care Screening Not Applicable Not Applicable Complete   Skilled Nursing Facility Complete Complete Patient Refused  Some recent data might be hidden

## 2020-06-06 LAB — IMMUNOFIXATION ELECTROPHORESIS
IgA: 84 mg/dL — ABNORMAL LOW (ref 87–352)
IgG (Immunoglobin G), Serum: 1720 mg/dL — ABNORMAL HIGH (ref 586–1602)
IgM (Immunoglobulin M), Srm: 61 mg/dL (ref 26–217)
Total Protein ELP: 6.4 g/dL (ref 6.0–8.5)

## 2020-06-06 LAB — GLUCOSE, CAPILLARY
Glucose-Capillary: 151 mg/dL — ABNORMAL HIGH (ref 70–99)
Glucose-Capillary: 150 mg/dL — ABNORMAL HIGH (ref 70–99)
Glucose-Capillary: 209 mg/dL — ABNORMAL HIGH (ref 70–99)

## 2020-06-06 LAB — FUNGUS CULTURE, BLOOD
Culture: NO GROWTH
Special Requests: ADEQUATE

## 2020-06-06 LAB — TROPONIN I (HIGH SENSITIVITY): Troponin I (High Sensitivity): 3 ng/L (ref <18)

## 2020-06-06 MED ORDER — PREDNISONE 5 MG PO TABS: ORAL_TABLET | ORAL | 0 refills | Status: DC

## 2020-06-06 NOTE — Discharge Summary (Signed)
Physician Discharge Summary  KALANDRA MASTERS TDD:[id number] DOB: 04-12-1956 DOA: 05/27/2020  PCP: [name], MD  Admit date: 05/27/2020 Discharge date: 06/06/2020  Admitted From: SNF Disposition:  SNF  Recommendations for Outpatient Follow-up:  1. Follow up with PCP in 1-2 weeks 2. Needs to follow up with hematology oncology cancer center on 7/19 1. Infectious disease Dr. Linus Salmons 7/28  Home Health: none  Discharge Condition: stable.  CODE STATUS: full code Diet recommendation:Regular   Brief/Interim Summary: 64 year old female with past medical history significant for polycystic disease status post kidney transplant on chronic immunosuppressive therapy, COPD, chronic respiratory failure with hypoxemia on 2 L of oxygen at home, diastolic heart failure, pulmonary hypertension who presented with shortness of breath.  Patient was recently admitted on 6/21--- 6/30 for management of sepsis.  On presentation she was febrile, tachycardic tachypneic and had leukocytosis.  Chest x-ray did not show any acute abnormalities.   1-SIRS, fever of unknown origin, sepsis was ruled out: Present with fever, tachycardia tachypnea.  Chest x-ray negative for acute finding.  Urine grew Proteus but no symptoms of UTI. Patient had a very small chronic sacral stage II without any surrounding signs or symptoms of infection, recent MRI on 6/23 was negative for abscess, osteomyelitis, recent CT chest abdomen and pelvis negative. Frequent hospitalization for the same, this is 11 admission so far for similar symptoms. Patient is disease consulted, antibiotic discontinue She remained stable for 5 days off of antibiotics SPEP showed very mild M spike.  Bone marrow biopsy showed 11% plasma cell kappa light chain.  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  Appointment is scheduled with the cancer center on July 19 -Patient will need to follow-up with infectious disease Dr. Linus Salmons,  virtual  visits set up for 7/28  2-Acute COPD, chronic respiratory failure on 2 L of oxygen Report worsening shortness of breath and chest tightness today. Plan to resume prednisone, she was off of prednisone and not receive low-dose prednisone for the last 2 days. Schedule nebulizer treatments again. Feeling better, chest tightness improved. VQ scan negative for PE, troponin negative.  Discharge on higher dose of prednisone. Nebulizer treatment.   3-Chronic diastolic heart failure: Euvolemic 4-hyponatremia; stable 5-History of SVT: Continue with verapamil 6-History of kidney transplant: -Patient did not receive prednisone for the last 2 days.  Will resume prednisone at higher dose due to concern for COPD exacerbation. -Need to make sure patient after taper of prednisone resume her chronic dose of prednisone 7.5 mg Continue with other immunosuppressive therapy. Function continue to be stable  History of pulmonary hypertension: Continue with home oxygen supplementation Sacral Ulcer stage 2.  Present on admission, MRI negative for abscess or osteomyelitis.  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 Plan to follow-up with oncology  Pressure Injury 05/27/20 Coccyx Mid Stage 3 -  Full thickness tissue loss. Subcutaneous fat may be visible but bone, tendon or muscle are NOT exposed. (Active)  05/27/20 1918  Location: Coccyx  Location Orientation: Mid  Staging: Stage 3 -  Full thickness tissue loss. Subcutaneous fat may be visible but bone, tendon or muscle are NOT exposed.  Wound Description (Comments):   Present on Admission: Yes     Pressure Injury 05/27/20 Heel Right;Left Stage 1 -  Intact skin with non-blanchable redness of a localized area usually over a bony prominence. (Active)  05/27/20 1918  Location: Heel  Location Orientation: Right;Left  Staging: Stage 1 -  Intact skin  with non-blanchable redness of a localized  area usually over a bony prominence.  Wound Description (Comments):   Present on Admission: Yes    Discharge Diagnoses:  Active Problems:   Renal transplant recipient   Chronic diastolic CHF (congestive heart failure) (HCC)   Hyponatremia   Sepsis ([place])   Anemia of chronic disease   Acute on chronic respiratory failure with hypoxia (HCC)   COPD with acute exacerbation (HCC)   FUO (fever of unknown origin)   History of immunosuppressive therapy    Discharge Instructions  Discharge Instructions    Diet - low sodium heart healthy   Complete by: As directed    Diet - low sodium heart healthy   Complete by: As directed    Diet Carb Modified   Complete by: As directed    Discharge wound care:   Complete by: As directed    See recommendation from wound care   Increase activity slowly   Complete by: As directed    Increase activity slowly   Complete by: As directed    No wound care   Complete by: As directed      Allergies as of 06/06/2020      Reactions   Infed [iron Dextran] Other (See Comments)   Chest tightness   Pentamidine Itching, Shortness Of Breath, Swelling   Erythromycin [erythromycin] Other (See Comments)   Mouth Ulcers   Iohexol Other (See Comments)   Per patient "she has had a kidney transplant and should never have contrast"   Oxycodone Nausea Only, Nausea And Vomiting   Erythromycin Rash   Causes breakout in mouth   Ultram [tramadol Hcl] Anxiety      Medication List    STOP taking these medications   docusate sodium 100 MG capsule Commonly known as: COLACE   feeding supplement (ENSURE ENLIVE) Liqd   Symbicort 160-4.5 MCG/ACT inhaler Generic drug: budesonide-formoterol     TAKE these medications   acetaminophen 325 MG tablet Commonly known as: TYLENOL Take 2 tablets (650 mg total) by mouth every 6 (six) hours as needed for mild pain (or Fever >/= 101).   albuterol 108 (90 Base) MCG/ACT inhaler Commonly known as: VENTOLIN HFA Inhale 2  puffs into the lungs every 4 (four) hours as needed for wheezing or shortness of breath.   allopurinol 300 MG tablet Commonly known as: ZYLOPRIM Take 1 tablet (300 mg total) by mouth daily.   ascorbic acid 500 MG tablet Commonly known as: VITAMIN C Take 500 mg by mouth daily.   aspirin 81 MG chewable tablet Chew 81 mg by mouth daily.   brimonidine 0.1 % Soln Commonly known as: ALPHAGAN P Place 1 drop into both eyes 2 (two) times daily.   budesonide 1 MG/2ML nebulizer solution Commonly known as: PULMICORT Take 1 mg by nebulization in the morning and at bedtime. What changed: Another medication with the same name was removed. Continue taking this medication, and follow the directions you see here.   cholecalciferol 25 MCG (1000 UNIT) tablet Commonly known as: VITAMIN D3 Take 1 tablet (1,000 Units total) by mouth daily.   collagenase ointment Commonly known as: SANTYL Apply 1 application topically daily. Sacral wound   cyanocobalamin 1000 MCG tablet Take 1,000 mcg by mouth daily.   cycloSPORINE 0.05 % ophthalmic emulsion Commonly known as: RESTASIS Place 1 drop into both eyes 2 (two) times daily.   dextromethorphan-guaiFENesin 30-600 MG 12hr tablet Commonly known as: MUCINEX DM Take 1 tablet by mouth 2 (two) times daily  as needed for cough.   dorzolamide 2 % ophthalmic solution Commonly known as: TRUSOPT Place 1 drop into both eyes daily.   ferrous sulfate 325 (65 FE) MG tablet Take 325 mg by mouth daily with breakfast.   FLUoxetine 40 MG capsule Commonly known as: PROZAC Take 1 capsule (40 mg total) by mouth daily.   furosemide 40 MG tablet Commonly known as: LASIX Take 1 tablet (40 mg total) by mouth daily.   gabapentin 100 MG capsule Commonly known as: NEURONTIN Take 200 mg by mouth 3 (three) times daily.   guaiFENesin 600 MG 12 hr tablet Commonly known as: MUCINEX Take 1 tablet (600 mg total) by mouth 2 (two) times daily.   ipratropium 17 MCG/ACT  inhaler Commonly known as: ATROVENT HFA Inhale 1 puff into the lungs every 6 (six) hours. For COPD   isosorbide mononitrate 60 MG 24 hr tablet Commonly known as: IMDUR Take 1 tablet (60 mg total) by mouth daily.   levalbuterol 0.63 MG/3ML nebulizer solution Commonly known as: XOPENEX Take 0.63 mg by nebulization See admin instructions. Inhale 0.51m once daily.  May take 0.683mevery six hours as needed COPD.   magnesium oxide 400 MG tablet Commonly known as: MAG-OX Take 1 tablet (400 mg total) by mouth 2 (two) times daily.   multivitamin with minerals Tabs tablet Take 1 tablet by mouth daily.   mycophenolate 180 MG EC tablet Commonly known as: MYFORTIC Take 180 mg by mouth 2 (two) times daily.   NovoLOG 100 UNIT/ML injection Generic drug: insulin aspart Inject 2-12 Units into the skin See admin instructions. Per sliding scale subcutaneous, once a day - If blood sugar is: 0 - 200: 0 units   201 - 250: 2 units   251 - 300: 4 units   301 - 350: 6 units   351 - 400: 8 units   401 - 450: 10 units   451 - 500: 12 units   500 or greater: Call MD and give 14 units, then recheck blood sugar in 2 hours, at that time if blood sugar is greater than 400 or less than 80, call PEC TRIAGE.   pantoprazole 40 MG tablet Commonly known as: PROTONIX Take 1 tablet (40 mg total) by mouth 2 (two) times daily.   predniSONE 5 MG tablet Commonly known as: DELTASONE Take 20 mg for 3 days then 10 mg for 3 days then resume chronic home dose of 7.5 mg daily. What changed:   how much to take  how to take this  when to take this  additional instructions  Another medication with the same name was removed. Continue taking this medication, and follow the directions you see here.   sennosides-docusate sodium 8.6-50 MG tablet Commonly known as: SENOKOT-S Take 2 tablets by mouth daily.   tacrolimus 0.5 MG capsule Commonly known as: PROGRAF Take 0.5 mg by mouth at bedtime. Takes brand name Prograf    Prograf 1 MG capsule Generic drug: tacrolimus Take 1 mg by mouth daily.   Travatan Z 0.004 % Soln ophthalmic solution Generic drug: Travoprost (BAK Free) Place 1 drop into both eyes at bedtime.   valACYclovir 500 MG tablet Commonly known as: VALTREX Take 500 mg by mouth 2 (two) times daily.   verapamil 240 MG CR tablet Commonly known as: CALAN-SR Take 240 mg by mouth daily. What changed: Another medication with the same name was removed. Continue taking this medication, and follow the directions you see here.  Discharge Care Instructions  (From admission, onward)         Start     Ordered   06/06/20 0000  Discharge wound care:       Comments: See recommendation from wound care   06/06/20 1351          Follow-up Information    [name], MD. Schedule an appointment as soon as possible for a visit in 1 week(s).   Specialty: Family Medicine Contact information: [place] [address] [phone]        Thayer Headings, MD Follow up on 06/19/2020.   Specialty: Infectious Diseases Why: Virtual Visit to discuss results of Bone Marrow Biopsy Contact information: [address] [place] [address] [phone]              Allergies  Allergen Reactions   Infed [Iron Dextran] Other (See Comments)    Chest tightness   Pentamidine Itching, Shortness Of Breath and Swelling   Erythromycin [Erythromycin] Other (See Comments)    Mouth Ulcers   Iohexol Other (See Comments)    Per patient "she has had a kidney transplant and should never have contrast"   Oxycodone Nausea Only and Nausea And Vomiting   Erythromycin Rash    Causes breakout in mouth   Ultram [Tramadol Hcl] Anxiety    Consultations:  ID     Procedures/Studies: DG Chest 2 View  Result Date: 06/05/2020 CLINICAL DATA:  Chest tightness for several days EXAM: CHEST - 2 VIEW COMPARISON:  05/27/2020 FINDINGS: Frontal and lateral views of the  chest demonstrate an unremarkable cardiac silhouette. Lung volumes are diminished with scattered areas of bibasilar atelectasis. Trace bilateral pleural effusions are noted. No pneumothorax. IMPRESSION: 1. Trace bilateral pleural effusions. 2. Hypoventilatory changes.  No acute airspace disease. Electronically Signed   By: Randa Ngo M.D.   On: 06/05/2020 18:24   CT CHEST WO CONTRAST  Result Date: 05/14/2020 CLINICAL DATA:  64 year old female with shortness of breath and chest pain. EXAM: CT CHEST WITHOUT CONTRAST TECHNIQUE: Multidetector CT imaging of the chest was performed following the standard protocol without IV contrast. COMPARISON:  Chest radiograph dated 04/29/2020. FINDINGS: Evaluation of this exam is limited in the absence of intravenous contrast. Cardiovascular: There is mild cardiomegaly. No pericardial effusion. Coronary vascular calcification. There is mild atherosclerotic calcification of the thoracic aorta. There is mild dilatation of the main pulmonary trunk suggestive of pulmonary hypertension. Clinical correlation is recommended. Evaluation of the vasculature is limited in the absence of intravenous contrast. Mediastinum/Nodes: No hilar or mediastinal adenopathy. The esophagus and the thyroid gland are grossly unremarkable. No mediastinal fluid collection. Lungs/Pleura: Small bilateral pleural effusions similar to prior CT with associated partial compressive atelectasis of the lower lobes. Pneumonia is not excluded. Clinical correlation is recommended. There are bibasilar linear atelectasis/scarring. No pneumothorax. The central airways are patent. Upper Abdomen: Please see the report for the accompanying CT abdomen pelvis. Musculoskeletal: No chest wall mass or suspicious bone lesions identified. IMPRESSION: 1. Small bilateral pleural effusions with associated partial compressive atelectasis of the lower lobes. Pneumonia is not excluded. Clinical correlation is recommended. 2. Mild  cardiomegaly with coronary vascular calcification. 3. Mildly dilated main pulmonary trunk suggestive of pulmonary hypertension. Clinical correlation is recommended. 4. Aortic Atherosclerosis (ICD10-I70.0). Electronically Signed   By: Anner Crete M.D.   On: 05/14/2020 21:51   NM Pulmonary Perfusion  Result Date: 06/05/2020 CLINICAL DATA:  Hypoxemia, chest tightness for several days EXAM: NUCLEAR MEDICINE PERFUSION  LUNG SCAN TECHNIQUE: Perfusion images were obtained in multiple projections after intravenous injection of radiopharmaceutical. Ventilation scans intentionally deferred if perfusion scan and chest x-ray adequate for interpretation during COVID 19 epidemic. RADIOPHARMACEUTICALS:  3.9 mCi Tc-30mMAA IV COMPARISON:  06/05/2020 FINDINGS: Planar images of the chest are obtained during performance of the perfusion exam. There are no perfusion defects. Normal symmetrical radiotracer distribution throughout the lungs. IMPRESSION: 1. Normal perfusion exam.  No evidence of pulmonary embolus. Electronically Signed   By: MRanda NgoM.D.   On: 06/05/2020 18:42   NM Pulmonary Perfusion  Result Date: 05/14/2020 CLINICAL DATA:  Hypoxemia with elevated D-dimer. EXAM: NUCLEAR MEDICINE PERFUSION LUNG SCAN TECHNIQUE: Perfusion images were obtained in multiple projections after intravenous injection of radiopharmaceutical. Ventilation scans intentionally deferred if perfusion scan and chest x-ray adequate for interpretation during COVID 19 epidemic. RADIOPHARMACEUTICALS:  1.5 mCi Tc-982mAA IV COMPARISON:  None. FINDINGS: There is normal homogeneous distribution of tracer activity seen throughout both lungs. No segmental or subsegmental perfusion defects are identified. IMPRESSION: Normal nuclear medicine perfusion lung scan. Electronically Signed   By: ThVirgina Norfolk.D.   On: 05/14/2020 19:46   CT ABDOMEN PELVIS W CONTRAST  Result Date: 05/14/2020 CLINICAL DATA:  [age]ear old female with abdominal  pain. EXAM: CT ABDOMEN AND PELVIS WITH CONTRAST TECHNIQUE: Multidetector CT imaging of the abdomen and pelvis was performed using the standard protocol following bolus administration of intravenous contrast. CONTRAST:  10072mMNIPAQUE IOHEXOL 300 MG/ML  SOLN COMPARISON:  CT abdomen pelvis dated 04/15/2020. FINDINGS: Lower chest: Partially visualized small bilateral pleural effusions with associated bibasilar atelectasis. Please see report for chest CT. No intra-abdominal free air or free fluid. Hepatobiliary: Multiple hepatic hypodense lesions measure up to 3 cm. The larger lesions demonstrate fluid attenuation most consistent with cysts and smaller lesions are not well characterized. No calcified gallstone. There is a small pericholecystic fluid. Pancreas: Unremarkable. No pancreatic ductal dilatation or surrounding inflammatory changes. Spleen: Top-normal spleen size measuring up to 14 cm in craniocaudal length. Adrenals/Urinary Tract: The adrenal glands unremarkable. Innumerable bilateral renal cysts essentially replacing the renal parenchyma consistent with polycystic kidney disease. There is no hydronephrosis on either side. There is a right lower quadrant renal transplant. There is no hydronephrosis or nephrolithiasis of the transplant kidney. No peritransplant fluid collection. The urinary bladder is decompressed around a Foley catheter. Stomach/Bowel: There is moderate stool throughout the colon. There is no bowel obstruction or active inflammation. The appendix is normal. Vascular/Lymphatic: Mild aortoiliac atherosclerotic disease. The IVC is unremarkable. No portal venous gas. There is no adenopathy. Reproductive: The uterus is retroflexed. No adnexal masses. Other: Small fat containing umbilical hernia. Mild diffuse subcutaneous edema. Musculoskeletal: Osteopenia with degenerative changes of the spine. Total left hip arthroplasty. Old left pubic bone fracture as well as old sacral fractures. No acute  osseous pathology. IMPRESSION: 1. No acute intra-abdominal or pelvic pathology. 2. Polycystic kidney disease with a right lower quadrant renal transplant. No hydronephrosis or nephrolithiasis. 3. No bowel obstruction. Normal appendix. 4. Small bilateral pleural effusions with associated bibasilar atelectasis. Please see report for chest CT. 5. Aortic Atherosclerosis (ICD10-I70.0). Electronically Signed   By: AraAnner CreteD.   On: 05/14/2020 21:25   MR SACRUM SI JOINTS WO CONTRAST  Result Date: 05/15/2020 CLINICAL DATA:  Chronic sacral wound with recurrent fevers. Evaluate for osteomyelitis. EXAM: MRI SACRUM WITHOUT CONTRAST TECHNIQUE: Multiplanar, multisequence MR imaging of the sacrum was performed. No intravenous contrast was administered. COMPARISON:  CT abdomen pelvis from yesterday. FINDINGS:  Bones/Joint/Cartilage No suspicious marrow signal abnormality. No acute fracture or dislocation. Chronic bilateral sacral ala and S2 fractures. Prior left total hip arthroplasty with associated susceptibility artifact. Muscles and Tendons Bilateral piriformis, gluteal, and adductor muscle edema and atrophy. Soft tissue Left-sided sacrococcygeal ulcer. No fluid collection or hematoma. No soft tissue mass. Trace free fluid in the pelvis.  Fibroid uterus. IMPRESSION: 1. Left-sided sacrococcygeal ulcer without evidence of osteomyelitis or abscess. Electronically Signed   By: Titus Dubin M.D.   On: 05/15/2020 16:49   DG Chest Port 1 View  Result Date: 05/27/2020 CLINICAL DATA:  Chest pain and shortness of breath EXAM: PORTABLE CHEST 1 VIEW COMPARISON:  May 16, 2020 FINDINGS: The heart size and mediastinal contours are stable. The heart size is enlarged. Both lungs are clear. The visualized skeletal structures are unremarkable. IMPRESSION: No active cardiopulmonary disease. Electronically Signed   By: Abelardo Diesel M.D.   On: 05/27/2020 11:05   DG Chest Port 1 View  Result Date: 05/16/2020 CLINICAL DATA:   Shortness of breath. EXAM: PORTABLE CHEST 1 VIEW COMPARISON:  One-view chest x-ray 05/14/2020. CT chest without contrast 05/14/2020. FINDINGS: The a heart size is normal. Mild pulmonary vascular congestion is noted. Small right pleural effusion and atelectasis is again noted. No new airspace disease is present. IMPRESSION: 1. Stable appearance of small right pleural effusion and atelectasis. 2. Mild pulmonary vascular congestion. Electronically Signed   By: San Morelle M.D.   On: 05/16/2020 08:06   Portable chest 1 View  Result Date: 05/14/2020 CLINICAL DATA:  Shortness of breath EXAM: PORTABLE CHEST 1 VIEW COMPARISON:  04/23/2020 FINDINGS: Cardiac shadow is mildly prominent but stable accentuated by the portable technique. Overall inspiratory effort is again poor although previously seen atelectasis has improved. No focal confluent infiltrate or sizable effusion is noted. No bony abnormality is seen. IMPRESSION: No acute abnormality noted. Electronically Signed   By: Inez Catalina M.D.   On: 05/14/2020 09:28   DG Chest Portable 1 View  Result Date: 05/13/2020 CLINICAL DATA:  Short of breath chest pain EXAM: PORTABLE CHEST 1 VIEW COMPARISON:  04/29/2020 FINDINGS: Hypoventilation with decreased lung volume. Mild bibasilar atelectasis similar to the prior study Negative for heart failure or pneumonia. Small right pleural effusion. IMPRESSION: Hypoventilation with bibasilar atelectasis unchanged from the prior study Electronically Signed   By: Franchot Gallo M.D.   On: 05/13/2020 10:54   CT BONE MARROW BIOPSY  Result Date: 05/31/2020 CLINICAL DATA:  Renal failure.  Fever of unknown origin. EXAM: CT GUIDED DEEP ILIAC BONE ASPIRATION AND CORE BIOPSY TECHNIQUE: Patient was placed prone on the CT gantry and limited axial scans through the pelvis were obtained. Appropriate skin entry site was identified. Skin site was marked, prepped with chlorhexidine, draped in usual sterile fashion, and infiltrated  locally with 1% lidocaine. Intravenous Fentanyl 39mg and Versed 1.52mwere administered as conscious sedation during continuous monitoring of the patient's level of consciousness and physiological / cardiorespiratory status by the radiology RN, with a total moderate sedation time of 13 minutes. Under CT fluoroscopic guidance an 11-gauge Cook trocar bone needle was advanced into the right iliac bone just lateral to the sacroiliac joint. Once needle tip position was confirmed, core and aspiration samples were obtained, submitted to pathology for approval. Additional core was sent for requested microbiologic analysis. Patient tolerated procedure well. COMPLICATIONS: COMPLICATIONS none IMPRESSION: 1. Technically successful CT guided right iliac bone core and aspiration biopsy. Electronically Signed   By: D Lucrezia Europe.D.   On:  05/31/2020 12:22   VAS [place] LOWER EXTREMITY VENOUS (DVT)  Result Date: 05/13/2020  Lower Venous DVTStudy Indications: SOB, and sepsis.  Comparison Study: 12/16/19, 02/02/20, 03/16/20 all negative for DVT Performing Technologist: June Leap RDMS, RVT  Examination Guidelines: A complete evaluation includes B-mode imaging, spectral Doppler, color Doppler, and power Doppler as needed of all accessible portions of each vessel. Bilateral testing is considered an integral part of a complete examination. Limited examinations for reoccurring indications may be performed as noted. The reflux portion of the exam is performed with the patient in reverse Trendelenburg.  +---------+---------------+---------+-----------+----------+--------------+  RIGHT     Compressibility Phasicity Spontaneity Properties Thrombus Aging  +---------+---------------+---------+-----------+----------+--------------+  CFV       Full            Yes       Yes                                    +---------+---------------+---------+-----------+----------+--------------+  SFJ       Full                                                              +---------+---------------+---------+-----------+----------+--------------+  FV Prox   Full                                                             +---------+---------------+---------+-----------+----------+--------------+  FV Mid    Full                                                             +---------+---------------+---------+-----------+----------+--------------+  FV Distal Full                                                             +---------+---------------+---------+-----------+----------+--------------+  PFV       Full                                                             +---------+---------------+---------+-----------+----------+--------------+  POP       Full            Yes       Yes                                    +---------+---------------+---------+-----------+----------+--------------+  PTV       Full                                                             +---------+---------------+---------+-----------+----------+--------------+  PERO      Full                                                             +---------+---------------+---------+-----------+----------+--------------+   +---------+---------------+---------+-----------+----------+--------------+  LEFT      Compressibility Phasicity Spontaneity Properties Thrombus Aging  +---------+---------------+---------+-----------+----------+--------------+  CFV       Full            Yes       Yes                                    +---------+---------------+---------+-----------+----------+--------------+  SFJ       Full                                                             +---------+---------------+---------+-----------+----------+--------------+  FV Prox   Full                                                             +---------+---------------+---------+-----------+----------+--------------+  FV Mid    Full                                                              +---------+---------------+---------+-----------+----------+--------------+  FV Distal Full                                                             +---------+---------------+---------+-----------+----------+--------------+  PFV       Full                                                             +---------+---------------+---------+-----------+----------+--------------+  POP       Full            Yes       Yes                                    +---------+---------------+---------+-----------+----------+--------------+  PTV       Full                                                             +---------+---------------+---------+-----------+----------+--------------+  PERO      Full                                                             +---------+---------------+---------+-----------+----------+--------------+     Summary: BILATERAL: - No evidence of deep vein thrombosis seen in the lower extremities, bilaterally. -No evidence of popliteal cyst, bilaterally.   *See table(s) above for measurements and observations. Electronically signed by Ruta Hinds MD on 05/13/2020 at 3:35:07 PM.    Final    Subjective: Feels better, dyspnea improved  Discharge Exam: Vitals:   06/06/20 0849 06/06/20 0906  BP:  (!) 115/57  Pulse:  93  Resp:  20  Temp:  97.9 F (36.6 C)  SpO2: 100% 100%     General: Pt is alert, awake, not in acute distress Cardiovascular: RRR, S1/S2 +, no rubs, no gallops Respiratory: CTA bilaterally, no wheezing, no rhonchi Abdominal: Soft, NT, ND, bowel sounds + Extremities: no edema, no cyanosis    The results of significant diagnostics from this hospitalization (including imaging, microbiology, ancillary and laboratory) are listed below for reference.     Microbiology: Recent Results (from the past 240 hour(s))  MRSA PCR Screening     Status: None   Collection Time: 05/27/20  8:45 PM   Specimen: Nasal Mucosa; Nasopharyngeal  Result Value Ref Range Status   MRSA  by PCR NEGATIVE NEGATIVE Final    Comment:        The GeneXpert MRSA Assay (FDA approved for NASAL specimens only), is one component of a comprehensive MRSA colonization surveillance program. It is not intended to diagnose MRSA infection nor to guide or monitor treatment for MRSA infections. Performed at [facility], [place] [address], [place], [place] [address]   Fungus culture, blood     Status: None   Collection Time: 05/30/20 10:28 AM   Specimen: BLOOD RIGHT HAND  Result Value Ref Range Status   Specimen Description BLOOD RIGHT HAND  Final   Special Requests   Final    BOTTLES DRAWN AEROBIC AND ANAEROBIC Blood Culture adequate volume   Culture   Final    NO GROWTH 7 DAYS NO FUNGUS ISOLATED Performed at [facility], [place] [address] [place], [place] [address]    Report Status 06/06/2020 FINAL  Final  Acid Fast Smear (AFB)     Status: None   Collection Time: 05/31/20  9:24 AM   Specimen: Bone Biopsy; Tissue  Result Value Ref Range Status   AFB Specimen Processing Comment  Final    Comment: Tissue Grinding and Digestion/Decontamination   Acid Fast Smear Negative  Final    Comment: (NOTE) Performed At: [facility] [place], [place] [id number] Rush Farmer MD WI:[id number]    Source (AFB) BONE MARROW  Final    Comment: Performed at [facility], [place] [address] [place], [place] [address]  SARS CORONAVIRUS 2 (TAT 6-24 HRS) Nasopharyngeal Nasopharyngeal Swab     Status: None   Collection Time: 06/03/20 12:22 PM   Specimen: Nasopharyngeal Swab  Result Value Ref Range Status   SARS Coronavirus 2 NEGATIVE NEGATIVE Final    Comment: (NOTE) SARS-CoV-2 target nucleic acids are NOT DETECTED.  The SARS-CoV-2 RNA is generally detectable in upper and lower respiratory specimens during the acute  phase of infection. Negative results do not preclude SARS-CoV-2 infection, do not rule out co-infections with other pathogens, and should not be  used as the sole basis for treatment or other patient management decisions. Negative results must be combined with clinical observations, patient history, and epidemiological information. The expected result is Negative.  Fact Sheet for Patients: SugarRoll.be  Fact Sheet for Healthcare Providers: https://www.woods-mathews.com/  This test is not yet approved or cleared by the [place] FDA and  has been authorized for detection and/or diagnosis of SARS-CoV-2 by FDA under an Emergency Use Authorization (EUA). This EUA will remain  in effect (meaning this test can be used) for the duration of the COVID-19 declaration under Se ction 564(b)(1) of the Act, 21 U.S.C. section 360bbb-3(b)(1), unless the authorization is terminated or revoked sooner.  Performed at [facility], [place] [address] [place], [place] [address]      Labs: BNP (last 3 results) Recent Labs    05/19/20 0412 05/20/20 0316 05/27/20 1020  BNP 269.2* 167.5* 638.7*   Basic Metabolic Panel: Recent Labs  Lab 06/01/20 0723 06/02/20 0451 06/03/20 0438 06/04/20 0556 06/05/20 1033  NA 140 143 138 136 133*  K 4.6 4.4 4.4 4.7 4.6  CL 108 111 106 102 100  CO2 _0 GLUCOSE 107* 115* 123* 93 134*  BUN 46* 36* 29* 24* 24*  CREATININE 1.01* 0.90 1.05* 0.95 0.90  CALCIUM 9.9 9.8 9.4 9.4 9.1   Liver Function Tests: Recent Labs  Lab 05/31/20 0615 06/04/20 0556  AST 16 15  ALT 16 12  ALKPHOS 63 58  BILITOT 0.7 1.5*  PROT 6.2* 6.4*  ALBUMIN 2.3* 2.4*   No results for input(s): LIPASE, AMYLASE in the last 168 hours. No results for input(s): AMMONIA in the last 168 hours. CBC: Recent Labs  Lab 05/31/20 0615 06/01/20 0723 06/02/20 0451 06/03/20 0438 06/04/20 0556  WBC 5.3 5.0 6.0 8.2 7.1  NEUTROABS 4.1 3.7  --   --  5.5  HGB 8.5* 9.1* 8.5* 9.7* 8.9*  HCT 28.9* 30.8* 28.7* 31.5* 29.9*  MCV 105.9* 104.4* 103.6* 104.0* 103.5*  PLT 152 140* 135*  155 158   Cardiac Enzymes: No results for input(s): CKTOTAL, CKMB, CKMBINDEX, TROPONINI in the last 168 hours. BNP: Invalid input(s): POCBNP CBG: Recent Labs  Lab 06/05/20 1112 06/05/20 1630 06/05/20 2122 06/06/20 0701 06/06/20 1130  GLUCAP 112* 195* 240* 151* 150*   D-Dimer No results for input(s): DDIMER in the last 72 hours. Hgb A1c No results for input(s): HGBA1C in the last 72 hours. Lipid Profile No results for input(s): CHOL, HDL, LDLCALC, TRIG, CHOLHDL, LDLDIRECT in the last 72 hours. Thyroid function studies No results for input(s): TSH, T4TOTAL, T3FREE, THYROIDAB in the last 72 hours.  Invalid input(s): FREET3 Anemia work up No results for input(s): VITAMINB12, FOLATE, FERRITIN, TIBC, IRON, RETICCTPCT in the last 72 hours. Urinalysis    Component Value Date/Time   COLORURINE YELLOW 05/27/2020 1007   APPEARANCEUR CLEAR 05/27/2020 1007   LABSPEC 1.014 05/27/2020 1007   PHURINE 6.0 05/27/2020 1007   GLUCOSEU NEGATIVE 05/27/2020 1007   HGBUR SMALL (A) 05/27/2020 1007   BILIRUBINUR NEGATIVE 05/27/2020 1007   KETONESUR NEGATIVE 05/27/2020 1007   PROTEINUR 100 (A) 05/27/2020 1007   UROBILINOGEN 0.2 05/15/2013 0733   NITRITE NEGATIVE 05/27/2020 1007   LEUKOCYTESUR NEGATIVE 05/27/2020 1007   Sepsis Labs Invalid input(s): PROCALCITONIN,  WBC,  LACTICIDVEN Microbiology Recent Results (from the past 240 hour(s))  MRSA  PCR Screening     Status: None   Collection Time: 05/27/20  8:45 PM   Specimen: Nasal Mucosa; Nasopharyngeal  Result Value Ref Range Status   MRSA by PCR NEGATIVE NEGATIVE Final    Comment:        The GeneXpert MRSA Assay (FDA approved for NASAL specimens only), is one component of a comprehensive MRSA colonization surveillance program. It is not intended to diagnose MRSA infection nor to guide or monitor treatment for MRSA infections. Performed at [facility], [place] [address] [place], [place] [address]   Fungus culture, blood      Status: None   Collection Time: 05/30/20 10:28 AM   Specimen: BLOOD RIGHT HAND  Result Value Ref Range Status   Specimen Description BLOOD RIGHT HAND  Final   Special Requests   Final    BOTTLES DRAWN AEROBIC AND ANAEROBIC Blood Culture adequate volume   Culture   Final    NO GROWTH 7 DAYS NO FUNGUS ISOLATED Performed at [facility], [place] [address], [place], [place] [address]    Report Status 06/06/2020 FINAL  Final  Acid Fast Smear (AFB)     Status: None   Collection Time: 05/31/20  9:24 AM   Specimen: Bone Biopsy; Tissue  Result Value Ref Range Status   AFB Specimen Processing Comment  Final    Comment: Tissue Grinding and Digestion/Decontamination   Acid Fast Smear Negative  Final    Comment: (NOTE) Performed At: [facility] [place], [place] [id number] Rush Farmer MD ZT:[id number]    Source (AFB) BONE MARROW  Final    Comment: Performed at [facility], [place] [address] [place], [place] [address]  SARS CORONAVIRUS 2 (TAT 6-24 HRS) Nasopharyngeal Nasopharyngeal Swab     Status: None   Collection Time: 06/03/20 12:22 PM   Specimen: Nasopharyngeal Swab  Result Value Ref Range Status   SARS Coronavirus 2 NEGATIVE NEGATIVE Final    Comment: (NOTE) SARS-CoV-2 target nucleic acids are NOT DETECTED.  The SARS-CoV-2 RNA is generally detectable in upper and lower respiratory specimens during the acute phase of infection. Negative results do not preclude SARS-CoV-2 infection, do not rule out co-infections with other pathogens, and should not be used as the sole basis for treatment or other patient management decisions. Negative results must be combined with clinical observations, patient history, and epidemiological information. The expected result is Negative.  Fact Sheet for Patients: SugarRoll.be  Fact Sheet for Healthcare Providers: https://www.woods-mathews.com/  This test is not yet approved  or cleared by the [place] FDA and  has been authorized for detection and/or diagnosis of SARS-CoV-2 by FDA under an Emergency Use Authorization (EUA). This EUA will remain  in effect (meaning this test can be used) for the duration of the COVID-19 declaration under Se ction 564(b)(1) of the Act, 21 U.S.C. section 360bbb-3(b)(1), unless the authorization is terminated or revoked sooner.  Performed at [facility], [place] [address] [place], [place] [address]      Time coordinating discharge: 40 minutes  SIGNED:   Elmarie Shiley, MD  [facility]

## 2020-06-06 NOTE — NC FL2 (Signed)
[place] LEVEL OF CARE SCREENING TOOL     IDENTIFICATION  Patient Name: April [name] Birthdate: 1956/08/13 Sex: female Admission Date (Current Location): 05/27/2020  [facility] and [place] Number:  Herbalist and Address:  The . [facility], [place] [address] [place], [place] [address]      Provider Number: [id number]  Attending Physician Name and Address:  Elmarie Shiley, MD  Relative Name and Phone Number:  Tiersa, Dayley - Daughter [phone] (mobile)  [phone]    Current Level of Care: Hospital Recommended Level of Care: [place] Prior Approval Number:    Date Approved/Denied:   PASRR Number: [id number]  Discharge Plan: SNF    Current Diagnoses: Patient Active Problem List   Diagnosis Date Noted  . History of immunosuppressive therapy 05/27/2020  . FUO (fever of unknown origin) 05/15/2020  . Acute on chronic respiratory failure with hypoxia and hypercapnia (HCC)   . Immunocompromised state ([place])   . Decubitus ulcer of coccyx, unstageable ([place]) 05/01/2020  . Asthma 04/29/2020  . Sepsis due to gram-negative UTI ([place]) 04/16/2020  . Pericardial effusion 04/16/2020  . Chronic respiratory failure with hypoxia ([place]) 04/16/2020  . Proteinuria 04/16/2020  . Acute respiratory distress 04/16/2020  . Pressure injury of skin 04/08/2020  . COPD with acute exacerbation ([place]) 04/07/2020  . Acute on chronic respiratory failure with hypoxia ([place]) 03/14/2020  . PNA (pneumonia) 03/13/2020  . SVT (supraventricular tachycardia) ([place])   . Anemia of chronic disease   . Palliative care encounter   . Goals of care, counseling/discussion   . Essential hypertension 02/08/2020  . Glaucoma 02/08/2020  . Sepsis ([place]) 01/31/2020  . Hypoxia   . Shortness of breath   . Fracture of left superior pubic ramus (HCC)   . Pain   . Anxiety state   . Left displaced femoral neck fracture ([place]) 12/15/2019  . Status post total  hip replacement, left   . Post-op pain   . Acute blood loss anemia   . Polycystic kidney   . Closed left hip fracture ([place]) 12/07/2019  . Left wrist fracture 12/07/2019  . Medication management 09/22/2019  . Physical deconditioning 09/22/2019  . Hyponatremia 06/29/2019  . Acute respiratory failure with hypoxia ([place]) 04/19/2019  . Acute on chronic diastolic CHF (congestive heart failure) ([place]) 04/19/2019  . Bilateral closed proximal tibial fracture 12/21/2018  . Closed right ankle fracture 12/21/2018  . Fracture 12/21/2018  . Lower urinary tract infectious disease 10/03/2018  . Asthma, chronic, unspecified asthma severity, with acute exacerbation 10/03/2018  . SIRS (systemic inflammatory response syndrome) ([place]) 10/03/2018  . Nausea vomiting and diarrhea 10/02/2018  . Chronic diastolic CHF (congestive heart failure) ([place]) 10/01/2017  . Influenza B 10/01/2017  . Persistent asthma with status asthmaticus   . Pneumonia 07/15/2016  . Asthma, mild intermittent 07/15/2016  . GERD without esophagitis 07/15/2016  . Renal transplant recipient 07/15/2016  . Hyperlipidemia 07/15/2016  . Depression 07/15/2016  . Bronchitis, mucopurulent recurrent ([place]) 08/08/2014  . Chronic cough 07/24/2014  . End stage renal disease ([place]) 03/23/2012  . 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, implant, and graft 03/23/2012    Orientation RESPIRATION BLADDER Height & Weight     Self, Time, Situation, Place  O2 (2 Liters oxygen) Incontinent, External catheter (Catheter placed 05/27/20) Weight: 162 lb 14.7 oz (73.9 kg) Height:  5\' 1"  (154.9 cm)  BEHAVIORAL SYMPTOMS/MOOD NEUROLOGICAL BOWEL NUTRITION STATUS      Continent Diet (Heart healthy)  AMBULATORY STATUS  COMMUNICATION OF NEEDS Skin   Supervision (Min guard per physical therapy) Verbally Other (Comment) (Stage 3 pressure injury to [place]; Stage 1 pressure injury to right/;left heel; Incision right back; Barasion left arm & hand w/foam dressing;  MASD posterior sacrum; Skin tear left arm with foam dressing)                       Personal Care Assistance Level of Assistance  Bathing, Feeding, Dressing Bathing Assistance: Limited assistance (Assistance with set-up) Feeding assistance: Independent Dressing Assistance: Limited assistance (Assistance with set-up)     Functional Limitations Info  Sight, Hearing, Speech Sight Info: Impaired Hearing Info: Adequate Speech Info: Adequate    SPECIAL CARE FACTORS FREQUENCY  PT (By licensed PT), OT (By licensed OT)     PT Frequency: PT eval 7/6. PT at SNF Eval and Treat, a minimum of 5 days per week OT Frequency: OT eval 7/6. OT at SNF Eval and Treat, aminimum of 5 days per week            Contractures Contractures Info: Not present    Additional Factors Info  Code Status, Allergies, Insulin Sliding Scale, Isolation Precautions Code Status Info: Full Allergies Info: Infed- Iron Dextran, Pentamidine, Erythromycin Erythromycin, Iohexol, Oxycodone, Erythromycin, Ultram-Tramadol Hcl   Insulin Sliding Scale Info: 0-9 Units 3 times per day with meals; 0-5 Units daily at bedtime Isolation Precautions Info: MDRO (multi-drug resistance organism)     Current Medications (06/06/2020):  This is the current hospital active medication list Current Facility-Administered Medications  Medication Dose Route Frequency Provider Last Rate Last Admin  . acetaminophen (TYLENOL) tablet 650 mg  650 mg Oral Q6H PRN Fuller Plan A, MD   650 mg at 06/04/20 0606   Or  . acetaminophen (TYLENOL) suppository 650 mg  650 mg Rectal Q6H PRN Fuller Plan A, MD      . allopurinol (ZYLOPRIM) tablet 300 mg  300 mg Oral Daily Smith, Rondell A, MD   300 mg at 06/06/20 0900  . arformoterol (BROVANA) nebulizer solution 15 mcg  15 mcg Nebulization BID Domenic Polite, MD   15 mcg at 06/06/20 0847  . aspirin chewable tablet 81 mg  81 mg Oral Daily Fuller Plan A, MD   81 mg at 06/06/20 0858  . brimonidine  (ALPHAGAN) 0.15 % ophthalmic solution 1 drop  1 drop Both Eyes BID Fuller Plan A, MD   1 drop at 06/06/20 1208  . budesonide (PULMICORT) nebulizer solution 1 mg  1 mg Nebulization BID Fuller Plan A, MD   1 mg at 06/06/20 0847  . collagenase (SANTYL) ointment 1 application  1 application Topical Daily Fuller Plan A, MD   1 application at 40/98/11 1423  . cycloSPORINE (RESTASIS) 0.05 % ophthalmic emulsion 1 drop  1 drop Both Eyes BID Fuller Plan A, MD   1 drop at 06/06/20 0901  . dorzolamide (TRUSOPT) 2 % ophthalmic solution 1 drop  1 drop Both Eyes Daily Tamala Julian, Rondell A, MD   1 drop at 06/06/20 1204  . enoxaparin (LOVENOX) injection 40 mg  40 mg Subcutaneous Q24H Allred, Darrell K, PA-C   40 mg at 06/05/20 1818  . ferrous sulfate tablet 325 mg  325 mg Oral Q breakfast Fuller Plan A, MD   325 mg at 06/06/20 0858  . FLUoxetine (PROZAC) capsule 40 mg  40 mg Oral Daily Fuller Plan A, MD   40 mg at 06/06/20 0857  . gabapentin (NEURONTIN) capsule 200 mg  200 mg Oral TID Fuller Plan A, MD   200 mg at 06/06/20 0858  . guaiFENesin (MUCINEX) 12 hr tablet 600 mg  600 mg Oral BID Tamala Julian, Rondell A, MD   600 mg at 06/06/20 0900  . insulin aspart (novoLOG) injection 0-5 Units  0-5 Units Subcutaneous QHS Norval Morton, MD   2 Units at 06/05/20 2220  . insulin aspart (novoLOG) injection 0-9 Units  0-9 Units Subcutaneous TID WC Fuller Plan A, MD   1 Units at 06/06/20 1304  . ipratropium (ATROVENT) nebulizer solution 0.5 mg  0.5 mg Nebulization Q6H Regalado, Belkys A, MD   0.5 mg at 06/06/20 0847  . isosorbide mononitrate (IMDUR) 24 hr tablet 60 mg  60 mg Oral Daily Fuller Plan A, MD   60 mg at 06/06/20 0858  . latanoprost (XALATAN) 0.005 % ophthalmic solution 1 drop  1 drop Both Eyes QHS Tamala Julian, Rondell A, MD   1 drop at 06/05/20 2219  . levalbuterol (XOPENEX) nebulizer solution 0.63 mg  0.63 mg Nebulization Q4H PRN Regalado, Belkys A, MD      . magnesium oxide (MAG-OX) tablet 400 mg  400  mg Oral BID Tamala Julian, Rondell A, MD   400 mg at 06/06/20 0900  . multivitamin with minerals tablet 1 tablet  1 tablet Oral Daily Fuller Plan A, MD   1 tablet at 06/06/20 0857  . mycophenolate (MYFORTIC) EC tablet 180 mg  180 mg Oral BID Fuller Plan A, MD   180 mg at 06/06/20 0858  . ondansetron (ZOFRAN) tablet 4 mg  4 mg Oral Q6H PRN Fuller Plan A, MD       Or  . ondansetron (ZOFRAN) injection 4 mg  4 mg Intravenous Q6H PRN Smith, Rondell A, MD      . pantoprazole (PROTONIX) EC tablet 40 mg  40 mg Oral BID Fuller Plan A, MD   40 mg at 06/06/20 0858  . predniSONE (DELTASONE) tablet 40 mg  40 mg Oral Q breakfast Regalado, Belkys A, MD   40 mg at 06/06/20 0912  . promethazine (PHENERGAN) injection 25 mg  25 mg Intravenous Q6H PRN Fuller Plan A, MD   25 mg at 05/27/20 1811  . senna-docusate (Senokot-S) tablet 2 tablet  2 tablet Oral Daily Fuller Plan A, MD   2 tablet at 06/06/20 0857  . sodium chloride flush (NS) 0.9 % injection 3 mL  3 mL Intravenous Q12H Smith, Rondell A, MD   3 mL at 06/06/20 0908  . tacrolimus (PROGRAF) capsule 0.5 mg  0.5 mg Oral QHS Smith, Rondell A, MD   0.5 mg at 06/05/20 2219  . tacrolimus (PROGRAF) capsule 1 mg  1 mg Oral Daily Fuller Plan A, MD   1 mg at 06/06/20 0858  . valACYclovir (VALTREX) tablet 500 mg  500 mg Oral BID Tamala Julian, Rondell A, MD   500 mg at 06/06/20 0900  . verapamil (CALAN-SR) CR tablet 240 mg  240 mg Oral Daily Tamala Julian, Rondell A, MD   240 mg at 06/06/20 0901  . vitamin B-12 (CYANOCOBALAMIN) tablet 1,000 mcg  1,000 mcg Oral Daily Fuller Plan A, MD   1,000 mcg at 06/06/20 0901     Discharge Medications: Please see discharge summary for a list of discharge medications.  Relevant Imaging Results:  Relevant Lab Results:   Additional Information ss#[id number]  Sable Feil, LCSW

## 2020-06-06 NOTE — Progress Notes (Signed)
DISCHARGE NOTE HOME April [name] to be discharged Skilled nursing facility per MD order. Discussed prescriptions and follow up appointments with the patient. Prescriptions given to patient; medication list explained in detail. Patient verbalized understanding.  Skin clean, dry and intact without evidence of skin break down, no evidence of skin tears noted. IV catheter discontinued intact. Site without signs and symptoms of complications. Dressing and pressure applied. Pt denies pain at the site currently. No complaints noted.  Patient free of lines, drains, and wounds.   An After Visit Summary (AVS) was printed and given to the patient. Patient escorted via wheelchair, and discharged home via private auto.  Arlyss Repress, RN

## 2020-06-06 NOTE — TOC Transition Note (Signed)
Transition of Care (TOC) - CM/SW Discharge Note *Discharged back to [facility] via non-emergency ambulance transport   Patient Details  Name: April [name] MRN: [id number] Date of Birth: 01/15/1956  Transition of Care [facility]) CM/SW Contact:  Sable Feil, LCSW Phone Number: 06/06/2020, 3:36 PM   Clinical Narrative: Patient medically stable today to return to [facility] to continue her rehabilitation. Insurance authorization received 7/13 and patient medically stable for discharge today. Discharge clinicals transmitted to facility and nurse provided with information to call report. [name] will be transported by non-emergency ambulance transport. She advised CSW that she will make contact with her daughter before leaving the hospital.    Final next level of care: [place] ([place]) Barriers to Discharge: Barriers Resolved   Patient Goals and CMS Choice Patient states their goals for this hospitalization and ongoing recovery are:: Patient's plan is to return to [place] to continue her rehab CMS Medicare.gov Compare Post Acute Care list provided to:: Other (Comment Required) (Not needed as patient returning to [facility]) Choice offered to / list presented to : NA  Discharge Placement   Existing PASRR number confirmed : 05/23/19          Patient chooses bed at: [facility] Patient to be transferred to facility by: [place] Name of family member notified: Patient advised CSW that she will contact her daughter before leaving the hospital Patient and family notified of of transfer: 06/06/20  Discharge Plan and Services In-house Referral: Clinical Social Work   Post Acute Care Choice: [place]                             Social Determinants of Health (SDOH) Interventions  No SDOH interventions requested or needed prior to discharge.  Readmission Risk Interventions Readmission Risk Prevention Plan 05/15/2020 04/19/2020  02/09/2020  Transportation Screening Complete Complete Complete  PCP or Specialist Appt within 3-5 Days - - -  Not Complete comments - - -  HRI or [place] or Home Care Consult comments - - -  Social Work Consult for [place] consult not completed comments - - -  Palliative Care Screening - - -  Medication Review ([place]) Referral to Pharmacy Complete Referral to Pharmacy  PCP or Specialist appointment within 3-5 days of discharge Complete Complete Complete  HRI or Home Care Consult Complete Complete Complete  SW Recovery Care/Counseling Consult Complete Complete -  Palliative Care Screening Not Applicable Not Applicable Complete  Skilled Nursing Facility Complete Complete Patient Refused  Some recent data might be hidden

## 2020-06-06 NOTE — Progress Notes (Signed)
Physical Therapy Treatment Patient Details Name: April [name] MRN: [id number] DOB: 1956/04/03 Today's Date: 06/06/2020    History of Present Illness April [name] is a 64 y.o. female with medical history significant of polycystic kidney disease s/p renal transplant on chronic immunosuppressive therapy, COPD, chronic respiratory failure with hypoxia on 2 L of nasal cannula oxygen at baseline, diastolic CHF, and pulmonary hypertension.  She presents with complaints of progressively worsening shortness of breath over the last 2 days.  She just recently been hospitalized from 6/21-6/30 with sepsis of origin.    PT Comments    Pt continuing to make steady progress towards achieving her functional mobility goals as indicated by requiring less physical assistance with bed mobility and transfers than previous sessions. She declined further gait training this session secondary to her breakfast being present, but agreeable to sit up in the recliner chair to eat. Plan is for possible d/c today back to [facility]. Pt would continue to benefit from skilled physical therapy services at this time while admitted and after d/c to address the below listed limitations in order to improve overall safety and independence with functional mobility.   Follow Up Recommendations  SNF     Equipment Recommendations  None recommended by PT    Recommendations for Other Services       Precautions / Restrictions Precautions Precautions: Fall Restrictions Weight Bearing Restrictions: No    Mobility  Bed Mobility Overal bed mobility: Needs Assistance Bed Mobility: Supine to Sit     Supine to sit: Supervision     General bed mobility comments: increased time and effort needed, HOB slightly elevated, no physical assistance required  Transfers Overall transfer level: Needs assistance Equipment used: Rolling walker (2 wheeled) Transfers: Sit to/from [facility] Sit to Stand: Min  guard Stand pivot transfers: Min guard       General transfer comment: good technique utilized, min guard for safety with transitional movements  Ambulation/Gait             General Gait Details: pt declining as her breakfast was present and wanting to eat   Stairs             Wheelchair Mobility    Modified Rankin (Stroke Patients Only)       Balance Overall balance assessment: Needs assistance Sitting-balance support: No upper extremity supported;Feet supported Sitting balance-Leahy Scale: Good     Standing balance support: Bilateral upper extremity supported Standing balance-Leahy Scale: Poor                              Cognition Arousal/Alertness: Awake/alert Behavior During Therapy: WFL for tasks assessed/performed Overall Cognitive Status: Within Functional Limits for tasks assessed                                        Exercises      General Comments        Pertinent Vitals/Pain Pain Assessment: Faces Faces Pain Scale: Hurts little more Pain Location: sacral area Pain Descriptors / Indicators: Guarding Pain Intervention(s): Monitored during session;Repositioned    Home Living                      Prior Function            PT Goals (current goals can now be found in  the care plan section) Acute Rehab PT Goals PT Goal Formulation: With patient Time For Goal Achievement: 06/11/20 Potential to Achieve Goals: Fair Progress towards PT goals: Progressing toward goals    Frequency    Min 3X/week      PT Plan Current plan remains appropriate    Co-evaluation              AM-PAC PT "6 Clicks" Mobility   Outcome Measure  Help needed turning from your back to your side while in a flat bed without using bedrails?: None Help needed moving from lying on your back to sitting on the side of a flat bed without using bedrails?: None Help needed moving to and from a bed to a chair (including a  wheelchair)?: A Little Help needed standing up from a chair using your arms (e.g., wheelchair or bedside chair)?: A Little Help needed to walk in hospital room?: A Little Help needed climbing 3-5 steps with a railing? : A Lot 6 Click Score: 19    End of Session Equipment Utilized During Treatment: Oxygen Activity Tolerance: Patient limited by fatigue Patient left: in chair;with call bell/phone within reach;with chair alarm set Nurse Communication: Mobility status PT Visit Diagnosis: Muscle weakness (generalized) (M62.81);Other abnormalities of gait and mobility (R26.89)     Time: 6503-5465 PT Time Calculation (min) (ACUTE ONLY): 18 min  Charges:  $Therapeutic Activity: 8-22 mins                     Anastasio Champion, DPT  Acute Rehabilitation Services Pager [phone] Office [place] 06/06/2020, 9:21 AM

## 2020-06-10 ENCOUNTER — Inpatient Hospital Stay: Payer: Medicare Other

## 2020-06-10 ENCOUNTER — Encounter (HOSPITAL_COMMUNITY): Payer: Self-pay | Admitting: Internal Medicine

## 2020-06-10 ENCOUNTER — Inpatient Hospital Stay: Payer: Medicare Other | Admitting: Hematology and Oncology

## 2020-06-10 ENCOUNTER — Other Ambulatory Visit: Payer: Self-pay | Admitting: Hematology and Oncology

## 2020-06-10 ENCOUNTER — Other Ambulatory Visit: Payer: Self-pay

## 2020-06-10 ENCOUNTER — Inpatient Hospital Stay: Payer: No Typology Code available for payment source

## 2020-06-10 ENCOUNTER — Encounter: Payer: Self-pay | Admitting: Hematology and Oncology

## 2020-06-10 ENCOUNTER — Inpatient Hospital Stay
Payer: No Typology Code available for payment source | Attending: Hematology and Oncology | Admitting: Hematology and Oncology

## 2020-06-10 VITALS — BP 125/62 | HR 91 | Temp 97.6°F | Resp 18 | Ht 61.0 in | Wt 158.4 lb

## 2020-06-10 DIAGNOSIS — D649 Anemia, unspecified: Secondary | ICD-10-CM | POA: Diagnosis not present

## 2020-06-10 DIAGNOSIS — J449 Chronic obstructive pulmonary disease, unspecified: Secondary | ICD-10-CM | POA: Insufficient documentation

## 2020-06-10 DIAGNOSIS — I110 Hypertensive heart disease with heart failure: Secondary | ICD-10-CM | POA: Insufficient documentation

## 2020-06-10 DIAGNOSIS — C9000 Multiple myeloma not having achieved remission: Secondary | ICD-10-CM

## 2020-06-10 DIAGNOSIS — Z940 Kidney transplant status: Secondary | ICD-10-CM | POA: Diagnosis not present

## 2020-06-10 DIAGNOSIS — I509 Heart failure, unspecified: Secondary | ICD-10-CM | POA: Diagnosis not present

## 2020-06-10 DIAGNOSIS — E785 Hyperlipidemia, unspecified: Secondary | ICD-10-CM | POA: Diagnosis not present

## 2020-06-10 DIAGNOSIS — Z7952 Long term (current) use of systemic steroids: Secondary | ICD-10-CM | POA: Diagnosis not present

## 2020-06-10 DIAGNOSIS — Q613 Polycystic kidney, unspecified: Secondary | ICD-10-CM | POA: Insufficient documentation

## 2020-06-10 DIAGNOSIS — Z87891 Personal history of nicotine dependence: Secondary | ICD-10-CM | POA: Insufficient documentation

## 2020-06-10 DIAGNOSIS — I5089 Other heart failure: Secondary | ICD-10-CM

## 2020-06-10 DIAGNOSIS — D472 Monoclonal gammopathy: Secondary | ICD-10-CM

## 2020-06-10 LAB — CBC WITH DIFFERENTIAL (CANCER CENTER ONLY)
Abs Immature Granulocytes: 0.12 10*3/uL — ABNORMAL HIGH (ref 0.00–0.07)
Basophils Absolute: 0 10*3/uL (ref 0.0–0.1)
Basophils Relative: 0 %
Eosinophils Absolute: 0 10*3/uL (ref 0.0–0.5)
Eosinophils Relative: 0 %
HCT: 31 % — ABNORMAL LOW (ref 36.0–46.0)
Hemoglobin: 9.3 g/dL — ABNORMAL LOW (ref 12.0–15.0)
Immature Granulocytes: 1 %
Lymphocytes Relative: 6 %
Lymphs Abs: 0.6 10*3/uL — ABNORMAL LOW (ref 0.7–4.0)
MCH: 30.8 pg (ref 26.0–34.0)
MCHC: 30 g/dL (ref 30.0–36.0)
MCV: 102.6 fL — ABNORMAL HIGH (ref 80.0–100.0)
Monocytes Absolute: 0.2 10*3/uL (ref 0.1–1.0)
Monocytes Relative: 2 %
Neutro Abs: 8.8 10*3/uL — ABNORMAL HIGH (ref 1.7–7.7)
Neutrophils Relative %: 91 %
Platelet Count: 246 10*3/uL (ref 150–400)
RBC: 3.02 MIL/uL — ABNORMAL LOW (ref 3.87–5.11)
RDW: 20.6 % — ABNORMAL HIGH (ref 11.5–15.5)
WBC Count: 9.7 10*3/uL (ref 4.0–10.5)
nRBC: 0 % (ref 0.0–0.2)

## 2020-06-10 LAB — CMP (CANCER CENTER ONLY)
ALT: 10 U/L (ref 0–44)
AST: 14 U/L — ABNORMAL LOW (ref 15–41)
Albumin: 3.1 g/dL — ABNORMAL LOW (ref 3.5–5.0)
Alkaline Phosphatase: 73 U/L (ref 38–126)
Anion gap: 13 (ref 5–15)
BUN: 36 mg/dL — ABNORMAL HIGH (ref 8–23)
CO2: 21 mmol/L — ABNORMAL LOW (ref 22–32)
Calcium: 10.4 mg/dL — ABNORMAL HIGH (ref 8.9–10.3)
Chloride: 106 mmol/L (ref 98–111)
Creatinine: 1.07 mg/dL — ABNORMAL HIGH (ref 0.44–1.00)
GFR, Est AFR Am: >60 mL/min (ref >60)
GFR, Estimated: 55 mL/min — ABNORMAL LOW (ref >60)
Glucose, Bld: 187 mg/dL — ABNORMAL HIGH (ref 70–99)
Potassium: 3.9 mmol/L (ref 3.5–5.1)
Sodium: 140 mmol/L (ref 135–145)
Total Bilirubin: 0.8 mg/dL (ref 0.3–1.2)
Total Protein: 7.8 g/dL (ref 6.5–8.1)

## 2020-06-10 LAB — RETIC PANEL
Immature Retic Fract: 28.4 % — ABNORMAL HIGH (ref 2.3–15.9)
RBC.: 3 MIL/uL — ABNORMAL LOW (ref 3.87–5.11)
Retic Count, Absolute: 69 10*3/uL (ref 19.0–186.0)
Retic Ct Pct: 2.3 % (ref 0.4–3.1)
Reticulocyte Hemoglobin: 36.6 pg (ref >27.9)

## 2020-06-10 LAB — IRON AND TIBC
Iron: 35 ug/dL — ABNORMAL LOW (ref 41–142)
Saturation Ratios: 14 % — ABNORMAL LOW (ref 21–57)
TIBC: 246 ug/dL (ref 236–444)
UIBC: 211 ug/dL (ref 120–384)

## 2020-06-10 LAB — SAVE SMEAR(SSMR), FOR PROVIDER SLIDE REVIEW

## 2020-06-10 LAB — LACTATE DEHYDROGENASE: LDH: 126 U/L (ref 98–192)

## 2020-06-10 LAB — FERRITIN: Ferritin: 286 ng/mL (ref 11–307)

## 2020-06-10 NOTE — Progress Notes (Signed)
[place] [phone]   Fax:([phone] [place] NOTE  Patient Care Team: [name], MD as PCP - General (Family Medicine) Deterding, Jeneen Rinks, MD (Nephrology)  Hematological/Oncological History # Monoclonal Gammopathy 1) 12/30/2019: SPEP showed M protein 0.5, Kappa 4.4, Lambda 6.19, ratio 0.71. 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  2) 05/31/2020: SPEP ordered showed an M spike of 1.2 3) 05/31/2020: BmBx showed 11/% plasma cells with kappa restriction. Ordered due to concern for PTLD 4) 06/10/2020: establish care with Dr. Lorenso Courier   CHIEF COMPLAINTS/PURPOSE OF CONSULTATION:  "Monoclonal gammopathy evaluation "  HISTORY OF PRESENTING ILLNESS:  April [name] 64 y.o. female with medical history significant for polycystic kidney s/p kidney transplant in 2013, COPD, CHF, HLD, and HTN who presents for evaluation of an elevated M protein and 11% plasma cells on bone marrow biopsy.   On review of the previous records April [name] initially underwent a monoclonal gammopathy evaluation on 12/30/2019.  At that time she had an SPEP which showed M protein of 0.5, kappa 4.4, lambda of 6.19 with a ratio of 0.71.  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  More recently on 06/01/2019 when she underwent repeat evaluation with an SPEP which showed an M spike of 1.2, bone marrow biopsy with 11% plasma cells with kappa restriction.  Due to concern for smoldering versus multiple myeloma the patient was referred to hematology for further evaluation management.  On exam today April [name] is accompanied by her daughter.  The patient has excellent insight into her current medical condition and referral noting that she has had numerous hospitalizations over the last year due to sepsis and shortness of breath.  She notes that she is aware that her bone marrow biopsy showed a "monoclonal gammopathy" and that there was an elevation in the plasma cell concentration.  On further discussion  she notes that she does have marked bruising of her arms, but no other overt signs of bleeding such as nosebleeds, dark stools, or hematuria.  She thinks that this may be secondary to the steroid therapy.  She underwent EGD and colonoscopy on 05/19/2020 which showed no evidence of a GI bleed.  The patient has no remarkable family history for blood disorders or blood diseases.  She is currently on immunosuppressive medications of mycophenolate, tacrolimus, and prednisone for the treatment of her kidney transplant was was performed at [facility] in 2013.  She is also taking numerous nutritional supplements including vitamin C, iron, and vitamin B12.  She notes that she has had no issues with fevers, chills, sweats, nausea, vomiting or diarrhea since her discharge from the hospital.  A full 10 point ROS is listed below.  MEDICAL HISTORY:  Past Medical History:  Diagnosis Date   Arthritis    Asthma    CHF (congestive heart failure) (HCC)    COPD (chronic obstructive pulmonary disease) ([place])    Depression    Essential hypertension 02/08/2020   GERD (gastroesophageal reflux disease)    Hyperlipidemia    Hyperparathyroidism    Polycystic kidney    PONV (postoperative nausea and vomiting)     SURGICAL HISTORY: Past Surgical History:  Procedure Laterality Date   AV FISTULA PLACEMENT  10-21-2010   left Brachiocephalic AVF   BILATERAL OOPHORECTOMY     BIOPSY  05/19/2020   Procedure: BIOPSY;  Surgeon: Carol Ada, MD;  Location: [place];  Service: Endoscopy;;   [place]  COLONOSCOPY WITH PROPOFOL N/A 05/19/2020   Procedure: COLONOSCOPY WITH PROPOFOL;  Surgeon: Carol Ada, MD;  Location: [place];  Service: Endoscopy;  Laterality: N/A;   ENTEROSCOPY N/A 05/19/2020   Procedure: ENTEROSCOPY;  Surgeon: Carol Ada, MD;  Location: [place];  Service: Endoscopy;  Laterality: N/A;   INSERTION OF DIALYSIS CATHETER   03/31/2012   Procedure: INSERTION OF DIALYSIS CATHETER;  Surgeon: Angelia Mould, MD;  Location: [place];  Service: Vascular;  Laterality: N/A;  insertion of dialysis catheter right internal jugular vein   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   ORIF TIBIA FRACTURE Left 12/23/2018   Procedure: OPEN REDUCTION INTERNAL FIXATION (ORIF) TIBIA FRACTURE;  Surgeon: Shona Needles, MD;  Location: [place];  Service: Orthopedics;  Laterality: Left;   ORIF TIBIA PLATEAU Right 12/23/2018   Procedure: OPEN REDUCTION INTERNAL FIXATION (ORIF) TIBIAL PLATEAU;  Surgeon: Shona Needles, MD;  Location: [place];  Service: Orthopedics;  Laterality: Right;   ORIF WRIST FRACTURE Left 12/08/2019   Procedure: OPEN REDUCTION INTERNAL FIXATION (ORIF) WRIST FRACTURE, REPAIR LACERATION LEFT FOREARM;  Surgeon: Dorna Leitz, MD;  Location: [facility];  Service: Orthopedics;  Laterality: Left;   TONSILLECTOMY  1967   TOTAL HIP ARTHROPLASTY Left 12/08/2019   Procedure: TOTAL HIP ARTHROPLASTY ANTERIOR APPROACH;  Surgeon: Dorna Leitz, MD;  Location: [facility];  Service: Orthopedics;  Laterality: Left;   TUBAL LIGATION  2010    SOCIAL HISTORY: Social History   Socioeconomic History   Marital status: Married    Spouse name: Not on file   Number of children: Not on file   Years of education: Not on file   Highest education level: Not on file  Occupational History   Occupation: retired  Tobacco Use   Smoking status: Former Smoker    Packs/day: 0.25    Years: 15.00    Pack years: 3.75    Types: Cigarettes    Quit date: 03/22/1998    Years since quitting: 22.2   Smokeless tobacco: Never Used  Vaping Use   Vaping Use: Never used  Substance and Sexual Activity   Alcohol use: No    Comment: social   Drug use: No   Sexual activity: Not on file  Other Topics Concern   Not on file  Social History Narrative   Married, lives with spouse, son and mother   2 children > son and daughter   OCCUPATION: retired Research officer, political party   Social Determinants of Radio broadcast assistant Strain:    Difficulty of Paying Living Expenses:   Food Insecurity:    Worried About Charity fundraiser in the Last Year:    Arboriculturist in the Last Year:   Transportation Needs:    Film/video editor (Medical):    Lack of Transportation (Non-Medical):   Physical Activity:    Days of Exercise per Week:    Minutes of Exercise per Session:   Stress:    Feeling of Stress :   Social Connections:    Frequency of Communication with Friends and Family:    Frequency of Social Gatherings with Friends and Family:    Attends Religious Services:    Active Member of Clubs or Organizations:    Attends Archivist Meetings:    Marital Status:   Intimate Partner Violence:    Fear of Current or Ex-Partner:    Emotionally Abused:    Physically Abused:    Sexually Abused:  FAMILY HISTORY: Family History  Problem Relation Age of Onset   Hypertension Mother    Heart disease Father        CABG history   Polycystic kidney disease Father    Polycystic kidney disease Brother     ALLERGIES:  is allergic to infed [iron dextran], pentamidine, erythromycin [erythromycin], iohexol, oxycodone, erythromycin, and ultram [tramadol hcl].  MEDICATIONS:  Current Outpatient Medications  Medication Sig Dispense Refill   acetaminophen (TYLENOL) 325 MG tablet Take 2 tablets (650 mg total) by mouth every 6 (six) hours as needed for mild pain (or Fever >/= 101).     albuterol (VENTOLIN HFA) 108 (90 Base) MCG/ACT inhaler Inhale 2 puffs into the lungs every 4 (four) hours as needed for wheezing or shortness of breath. (Patient not taking: Reported on 05/27/2020) 18 g 0   allopurinol (ZYLOPRIM) 300 MG tablet Take 1 tablet (300 mg total) by mouth daily. 30 tablet 0   ascorbic acid (VITAMIN C) 500 MG tablet Take 500 mg by mouth daily.     aspirin 81 MG chewable tablet Chew 81 mg by mouth daily.      brimonidine (ALPHAGAN P) 0.1 % SOLN Place 1 drop into both eyes 2 (two) times daily.      budesonide (PULMICORT) 1 MG/2ML nebulizer solution Take 1 mg by nebulization in the morning and at bedtime.     cholecalciferol (VITAMIN D3) 25 MCG (1000 UNIT) tablet Take 1 tablet (1,000 Units total) by mouth daily. 30 tablet 0   collagenase (SANTYL) ointment Apply 1 application topically daily. Sacral wound     cyanocobalamin 1000 MCG tablet Take 1,000 mcg by mouth daily.     cycloSPORINE (RESTASIS) 0.05 % ophthalmic emulsion Place 1 drop into both eyes 2 (two) times daily. 0.4 mL 0   dextromethorphan-guaiFENesin (MUCINEX DM) 30-600 MG 12hr tablet Take 1 tablet by mouth 2 (two) times daily as needed for cough.     dorzolamide (TRUSOPT) 2 % ophthalmic solution Place 1 drop into both eyes daily.     ferrous sulfate 325 (65 FE) MG tablet Take 325 mg by mouth daily with breakfast.     FLUoxetine (PROZAC) 40 MG capsule Take 1 capsule (40 mg total) by mouth daily. 30 capsule 3   furosemide (LASIX) 40 MG tablet Take 1 tablet (40 mg total) by mouth daily. 30 tablet 0   gabapentin (NEURONTIN) 100 MG capsule Take 200 mg by mouth 3 (three) times daily.     guaiFENesin (MUCINEX) 600 MG 12 hr tablet Take 1 tablet (600 mg total) by mouth 2 (two) times daily. 10 tablet 0   insulin aspart (NOVOLOG) 100 UNIT/ML injection Inject 2-12 Units into the skin See admin instructions. Per sliding scale subcutaneous, once a day - If blood sugar is: 0 - 200: 0 units   201 - 250: 2 units   251 - 300: 4 units   301 - 350: 6 units   351 - 400: 8 units   401 - 450: 10 units   451 - 500: 12 units   500 or greater: Call MD and give 14 units, then recheck blood sugar in 2 hours, at that time if blood sugar is greater than 400 or less than 80, call PEC TRIAGE.     ipratropium (ATROVENT HFA) 17 MCG/ACT inhaler Inhale 1 puff into the lungs every 6 (six) hours. For COPD     isosorbide mononitrate (IMDUR) 60 MG 24 hr tablet Take 1  tablet (60 mg total) by  mouth daily.     levalbuterol (XOPENEX) 0.63 MG/3ML nebulizer solution Take 0.63 mg by nebulization See admin instructions. Inhale 0.'63mg'$  once daily.  May take 0.'63mg'$  every six hours as needed COPD.     magnesium oxide (MAG-OX) 400 MG tablet Take 1 tablet (400 mg total) by mouth 2 (two) times daily. 60 tablet 0   Multiple Vitamin (MULTIVITAMIN WITH MINERALS) TABS tablet Take 1 tablet by mouth daily.     mycophenolate (MYFORTIC) 180 MG EC tablet Take 180 mg by mouth 2 (two) times daily.     pantoprazole (PROTONIX) 40 MG tablet Take 1 tablet (40 mg total) by mouth 2 (two) times daily. 60 tablet 0   predniSONE (DELTASONE) 5 MG tablet Take 20 mg for 3 days then 10 mg for 3 days then resume chronic home dose of 7.5 mg daily. 30 tablet 0   PROGRAF 1 MG capsule Take 1 mg by mouth daily.     sennosides-docusate sodium (SENOKOT-S) 8.6-50 MG tablet Take 2 tablets by mouth daily.      tacrolimus (PROGRAF) 0.5 MG capsule Take 0.5 mg by mouth at bedtime. Takes brand name Prograf     TRAVATAN Z 0.004 % SOLN ophthalmic solution Place 1 drop into both eyes at bedtime.     valACYclovir (VALTREX) 500 MG tablet Take 500 mg by mouth 2 (two) times daily.     verapamil (CALAN-SR) 240 MG CR tablet Take 240 mg by mouth daily.     No current facility-administered medications for this visit.    REVIEW OF SYSTEMS:   Constitutional: ( - ) fevers, ( - )  chills , ( - ) night sweats Eyes: ( - ) blurriness of vision, ( - ) double vision, ( - ) watery eyes Ears, nose, mouth, throat, and face: ( - ) mucositis, ( - ) sore throat Respiratory: ( - ) cough, ( - ) dyspnea, ( - ) wheezes Cardiovascular: ( - ) palpitation, ( - ) chest discomfort, ( - ) lower extremity swelling Gastrointestinal:  ( - ) nausea, ( - ) heartburn, ( - ) change in bowel habits Skin: ( - ) abnormal skin rashes Lymphatics: ( - ) new lymphadenopathy, ( - ) easy bruising Neurological: ( - ) numbness, ( - ) tingling, ( -  ) new weaknesses Behavioral/Psych: ( - ) mood change, ( - ) new changes  All other systems were reviewed with the patient and are negative.  PHYSICAL EXAMINATION: ECOG PERFORMANCE STATUS: 3 - Symptomatic, >50% confined to bed  Vitals:   06/10/20 1317  BP: 125/62  Pulse: 91  Resp: 18  Temp: 97.6 F (36.4 C)  SpO2: 96%   Filed Weights   06/10/20 1317  Weight: 158 lb 6.4 oz (71.8 kg)    GENERAL: chronically ill appearing middle aged Caucasian female in NAD. Sitting in a wheelchair.  SKIN: skin color, texture, turgor are normal, no rashes or significant lesions EYES: conjunctiva are pink and non-injected, sclera clear LUNGS: clear to auscultation and percussion with normal breathing effort HEART: regular rate & rhythm and no murmurs and no lower extremity edema Musculoskeletal: no cyanosis of digits and no clubbing  PSYCH: alert & oriented x 3, fluent speech NEURO: no focal motor/sensory deficits  LABORATORY DATA:  I have reviewed the data as listed CBC Latest Ref Rng & Units 06/04/2020 06/03/2020 06/02/2020  WBC 4.0 - 10.5 K/uL 7.1 8.2 6.0  Hemoglobin 12.0 - 15.0 g/dL 8.9(L) 9.7(L) 8.5(L)  Hematocrit 36 - 46 % 29.9(L)  31.5(L) 28.7(L)  Platelets 150 - 400 K/uL 158 155 135(L)    CMP Latest Ref Rng & Units 06/05/2020 06/04/2020 06/03/2020  Glucose 70 - 99 mg/dL 134(H) 93 123(H)  BUN 8 - 23 mg/dL 24(H) 24(H) 29(H)  Creatinine 0.44 - 1.00 mg/dL 0.90 0.95 1.05(H)  Sodium 135 - 145 mmol/L 133(L) 136 138  Potassium 3.5 - 5.1 mmol/L 4.6 4.7 4.4  Chloride 98 - 111 mmol/L 100 102 106  CO2 22 - 32 mmol/L '26 27 25  '$ Calcium 8.9 - 10.3 mg/dL 9.1 9.4 9.4  Total Protein 6.5 - 8.1 g/dL - 6.4(L) -  Total Bilirubin 0.3 - 1.2 mg/dL - 1.5(H) -  Alkaline Phos 38 - 126 U/L - 58 -  AST 15 - 41 U/L - 15 -  ALT 0 - 44 U/L - 12 -    RADIOGRAPHIC STUDIES: DG Chest 2 View  Result Date: 06/05/2020 CLINICAL DATA:  Chest tightness for several days EXAM: CHEST - 2 VIEW COMPARISON:  05/27/2020  FINDINGS: Frontal and lateral views of the chest demonstrate an unremarkable cardiac silhouette. Lung volumes are diminished with scattered areas of bibasilar atelectasis. Trace bilateral pleural effusions are noted. No pneumothorax. IMPRESSION: 1. Trace bilateral pleural effusions. 2. Hypoventilatory changes.  No acute airspace disease. Electronically Signed   By: Randa Ngo M.D.   On: 06/05/2020 18:24   CT CHEST WO CONTRAST  Result Date: 05/14/2020 CLINICAL DATA:  64 year old female with shortness of breath and chest pain. EXAM: CT CHEST WITHOUT CONTRAST TECHNIQUE: Multidetector CT imaging of the chest was performed following the standard protocol without IV contrast. COMPARISON:  Chest radiograph dated 04/29/2020. FINDINGS: Evaluation of this exam is limited in the absence of intravenous contrast. Cardiovascular: There is mild cardiomegaly. No pericardial effusion. Coronary vascular calcification. There is mild atherosclerotic calcification of the thoracic aorta. There is mild dilatation of the main pulmonary trunk suggestive of pulmonary hypertension. Clinical correlation is recommended. Evaluation of the vasculature is limited in the absence of intravenous contrast. Mediastinum/Nodes: No hilar or mediastinal adenopathy. The esophagus and the thyroid gland are grossly unremarkable. No mediastinal fluid collection. Lungs/Pleura: Small bilateral pleural effusions similar to prior CT with associated partial compressive atelectasis of the lower lobes. Pneumonia is not excluded. Clinical correlation is recommended. There are bibasilar linear atelectasis/scarring. No pneumothorax. The central airways are patent. Upper Abdomen: Please see the report for the accompanying CT abdomen pelvis. Musculoskeletal: No chest wall mass or suspicious bone lesions identified. IMPRESSION: 1. Small bilateral pleural effusions with associated partial compressive atelectasis of the lower lobes. Pneumonia is not excluded.  Clinical correlation is recommended. 2. Mild cardiomegaly with coronary vascular calcification. 3. Mildly dilated main pulmonary trunk suggestive of pulmonary hypertension. Clinical correlation is recommended. 4. Aortic Atherosclerosis (ICD10-I70.0). Electronically Signed   By: Anner Crete M.D.   On: 05/14/2020 21:51   NM Pulmonary Perfusion  Result Date: 06/05/2020 CLINICAL DATA:  Hypoxemia, chest tightness for several days EXAM: NUCLEAR MEDICINE PERFUSION LUNG SCAN TECHNIQUE: Perfusion images were obtained in multiple projections after intravenous injection of radiopharmaceutical. Ventilation scans intentionally deferred if perfusion scan and chest x-ray adequate for interpretation during COVID 19 epidemic. RADIOPHARMACEUTICALS:  3.9 mCi Tc-90mMAA IV COMPARISON:  06/05/2020 FINDINGS: Planar images of the chest are obtained during performance of the perfusion exam. There are no perfusion defects. Normal symmetrical radiotracer distribution throughout the lungs. IMPRESSION: 1. Normal perfusion exam.  No evidence of pulmonary embolus. Electronically Signed   By: MRanda NgoM.D.   On: 06/05/2020 18:42  NM Pulmonary Perfusion  Result Date: 05/14/2020 CLINICAL DATA:  Hypoxemia with elevated D-dimer. EXAM: NUCLEAR MEDICINE PERFUSION LUNG SCAN TECHNIQUE: Perfusion images were obtained in multiple projections after intravenous injection of radiopharmaceutical. Ventilation scans intentionally deferred if perfusion scan and chest x-ray adequate for interpretation during COVID 19 epidemic. RADIOPHARMACEUTICALS:  1.5 mCi Tc-27mMAA IV COMPARISON:  None. FINDINGS: There is normal homogeneous distribution of tracer activity seen throughout both lungs. No segmental or subsegmental perfusion defects are identified. IMPRESSION: Normal nuclear medicine perfusion lung scan. Electronically Signed   By: TVirgina NorfolkM.D.   On: 05/14/2020 19:46   CT ABDOMEN PELVIS W CONTRAST  Result Date: 05/14/2020 CLINICAL  DATA:  64year old female with abdominal pain. EXAM: CT ABDOMEN AND PELVIS WITH CONTRAST TECHNIQUE: Multidetector CT imaging of the abdomen and pelvis was performed using the standard protocol following bolus administration of intravenous contrast. CONTRAST:  1064mOMNIPAQUE IOHEXOL 300 MG/ML  SOLN COMPARISON:  CT abdomen pelvis dated 04/15/2020. FINDINGS: Lower chest: Partially visualized small bilateral pleural effusions with associated bibasilar atelectasis. Please see report for chest CT. No intra-abdominal free air or free fluid. Hepatobiliary: Multiple hepatic hypodense lesions measure up to 3 cm. The larger lesions demonstrate fluid attenuation most consistent with cysts and smaller lesions are not well characterized. No calcified gallstone. There is a small pericholecystic fluid. Pancreas: Unremarkable. No pancreatic ductal dilatation or surrounding inflammatory changes. Spleen: Top-normal spleen size measuring up to 14 cm in craniocaudal length. Adrenals/Urinary Tract: The adrenal glands unremarkable. Innumerable bilateral renal cysts essentially replacing the renal parenchyma consistent with polycystic kidney disease. There is no hydronephrosis on either side. There is a right lower quadrant renal transplant. There is no hydronephrosis or nephrolithiasis of the transplant kidney. No peritransplant fluid collection. The urinary bladder is decompressed around a Foley catheter. Stomach/Bowel: There is moderate stool throughout the colon. There is no bowel obstruction or active inflammation. The appendix is normal. Vascular/Lymphatic: Mild aortoiliac atherosclerotic disease. The IVC is unremarkable. No portal venous gas. There is no adenopathy. Reproductive: The uterus is retroflexed. No adnexal masses. Other: Small fat containing umbilical hernia. Mild diffuse subcutaneous edema. Musculoskeletal: Osteopenia with degenerative changes of the spine. Total left hip arthroplasty. Old left pubic bone fracture as  well as old sacral fractures. No acute osseous pathology. IMPRESSION: 1. No acute intra-abdominal or pelvic pathology. 2. Polycystic kidney disease with a right lower quadrant renal transplant. No hydronephrosis or nephrolithiasis. 3. No bowel obstruction. Normal appendix. 4. Small bilateral pleural effusions with associated bibasilar atelectasis. Please see report for chest CT. 5. Aortic Atherosclerosis (ICD10-I70.0). Electronically Signed   By: ArAnner Crete.D.   On: 05/14/2020 21:25   MR SACRUM SI JOINTS WO CONTRAST  Result Date: 05/15/2020 CLINICAL DATA:  Chronic sacral wound with recurrent fevers. Evaluate for osteomyelitis. EXAM: MRI SACRUM WITHOUT CONTRAST TECHNIQUE: Multiplanar, multisequence MR imaging of the sacrum was performed. No intravenous contrast was administered. COMPARISON:  CT abdomen pelvis from yesterday. FINDINGS: Bones/Joint/Cartilage No suspicious marrow signal abnormality. No acute fracture or dislocation. Chronic bilateral sacral ala and S2 fractures. Prior left total hip arthroplasty with associated susceptibility artifact. Muscles and Tendons Bilateral piriformis, gluteal, and adductor muscle edema and atrophy. Soft tissue Left-sided sacrococcygeal ulcer. No fluid collection or hematoma. No soft tissue mass. Trace free fluid in the pelvis.  Fibroid uterus. IMPRESSION: 1. Left-sided sacrococcygeal ulcer without evidence of osteomyelitis or abscess. Electronically Signed   By: WiTitus Dubin.D.   On: 05/15/2020 16:49   [facility]  Result Date: 05/27/2020 CLINICAL DATA:  Chest pain and shortness of breath EXAM: PORTABLE CHEST 1 VIEW COMPARISON:  May 16, 2020 FINDINGS: The heart size and mediastinal contours are stable. The heart size is enlarged. Both lungs are clear. The visualized skeletal structures are unremarkable. IMPRESSION: No active cardiopulmonary disease. Electronically Signed   By: Abelardo Diesel M.D.   On: 05/27/2020 11:05   DG Chest Port 1  View  Result Date: 05/16/2020 CLINICAL DATA:  Shortness of breath. EXAM: PORTABLE CHEST 1 VIEW COMPARISON:  One-view chest x-ray 05/14/2020. CT chest without contrast 05/14/2020. FINDINGS: The a heart size is normal. Mild pulmonary vascular congestion is noted. Small right pleural effusion and atelectasis is again noted. No new airspace disease is present. IMPRESSION: 1. Stable appearance of small right pleural effusion and atelectasis. 2. Mild pulmonary vascular congestion. Electronically Signed   By: San Morelle M.D.   On: 05/16/2020 08:06   Portable chest 1 View  Result Date: 05/14/2020 CLINICAL DATA:  Shortness of breath EXAM: PORTABLE CHEST 1 VIEW COMPARISON:  04/23/2020 FINDINGS: Cardiac shadow is mildly prominent but stable accentuated by the portable technique. Overall inspiratory effort is again poor although previously seen atelectasis has improved. No focal confluent infiltrate or sizable effusion is noted. No bony abnormality is seen. IMPRESSION: No acute abnormality noted. Electronically Signed   By: Inez Catalina M.D.   On: 05/14/2020 09:28   DG Chest Portable 1 View  Result Date: 05/13/2020 CLINICAL DATA:  Short of breath chest pain EXAM: PORTABLE CHEST 1 VIEW COMPARISON:  04/29/2020 FINDINGS: Hypoventilation with decreased lung volume. Mild bibasilar atelectasis similar to the prior study Negative for heart failure or pneumonia. Small right pleural effusion. IMPRESSION: Hypoventilation with bibasilar atelectasis unchanged from the prior study Electronically Signed   By: Franchot Gallo M.D.   On: 05/13/2020 10:54   CT BONE MARROW BIOPSY  Result Date: 05/31/2020 CLINICAL DATA:  Renal failure.  Fever of unknown origin. EXAM: CT GUIDED DEEP ILIAC BONE ASPIRATION AND CORE BIOPSY TECHNIQUE: Patient was placed prone on the CT gantry and limited axial scans through the pelvis were obtained. Appropriate skin entry site was identified. Skin site was marked, prepped with chlorhexidine,  draped in usual sterile fashion, and infiltrated locally with 1% lidocaine. Intravenous Fentanyl 61mg and Versed 1.'5mg'$  were administered as conscious sedation during continuous monitoring of the patient's level of consciousness and physiological / cardiorespiratory status by the radiology RN, with a total moderate sedation time of 13 minutes. Under CT fluoroscopic guidance an 11-gauge Cook trocar bone needle was advanced into the right iliac bone just lateral to the sacroiliac joint. Once needle tip position was confirmed, core and aspiration samples were obtained, submitted to pathology for approval. Additional core was sent for requested microbiologic analysis. Patient tolerated procedure well. COMPLICATIONS: COMPLICATIONS none IMPRESSION: 1. Technically successful CT guided right iliac bone core and aspiration biopsy. Electronically Signed   By: DLucrezia EuropeM.D.   On: 05/31/2020 12:22   VAS [place] EXTREMITY VENOUS (DVT)  Result Date: 05/13/2020  Lower Venous DVTStudy Indications: SOB, and sepsis.  Comparison Study: 12/16/19, 02/02/20, 03/16/20 all negative for DVT Performing Technologist: JJune LeapRDMS, RVT  Examination Guidelines: A complete evaluation includes B-mode imaging, spectral Doppler, color Doppler, and power Doppler as needed of all accessible portions of each vessel. Bilateral testing is considered an integral part of a complete examination. Limited examinations for reoccurring indications may be performed as noted. The reflux portion of the exam is performed with the patient  in reverse Trendelenburg.  +---------+---------------+---------+-----------+----------+--------------+  RIGHT     Compressibility Phasicity Spontaneity Properties Thrombus Aging  +---------+---------------+---------+-----------+----------+--------------+  CFV       Full            Yes       Yes                                    +---------+---------------+---------+-----------+----------+--------------+  SFJ       Full                                                              +---------+---------------+---------+-----------+----------+--------------+  FV Prox   Full                                                             +---------+---------------+---------+-----------+----------+--------------+  FV Mid    Full                                                             +---------+---------------+---------+-----------+----------+--------------+  FV Distal Full                                                             +---------+---------------+---------+-----------+----------+--------------+  PFV       Full                                                             +---------+---------------+---------+-----------+----------+--------------+  POP       Full            Yes       Yes                                    +---------+---------------+---------+-----------+----------+--------------+  PTV       Full                                                             +---------+---------------+---------+-----------+----------+--------------+  PERO      Full                                                             +---------+---------------+---------+-----------+----------+--------------+   +---------+---------------+---------+-----------+----------+--------------+  LEFT      Compressibility Phasicity Spontaneity Properties Thrombus Aging  +---------+---------------+---------+-----------+----------+--------------+  CFV       Full            Yes       Yes                                    +---------+---------------+---------+-----------+----------+--------------+  SFJ       Full                                                             +---------+---------------+---------+-----------+----------+--------------+  FV Prox   Full                                                             +---------+---------------+---------+-----------+----------+--------------+  FV Mid    Full                                                              +---------+---------------+---------+-----------+----------+--------------+  FV Distal Full                                                             +---------+---------------+---------+-----------+----------+--------------+  PFV       Full                                                             +---------+---------------+---------+-----------+----------+--------------+  POP       Full            Yes       Yes                                    +---------+---------------+---------+-----------+----------+--------------+  PTV       Full                                                             +---------+---------------+---------+-----------+----------+--------------+  PERO      Full                                                             +---------+---------------+---------+-----------+----------+--------------+  Summary: BILATERAL: - No evidence of deep vein thrombosis seen in the lower extremities, bilaterally. -No evidence of popliteal cyst, bilaterally.   *See table(s) above for measurements and observations. Electronically signed by Ruta Hinds MD on 05/13/2020 at 3:35:07 PM.    Final     ASSESSMENT & PLAN April [name] 64 y.o. female with medical history significant for polycystic kidney s/p kidney transplant in 2013, COPD, CHF, HLD, and HTN who presents for evaluation of an elevated M protein and 11% plasma cells on bone marrow biopsy.  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  This diagnosis is made solely on the basis of her elevated plasma cell concentration of 11% which puts her above the threshold necessary for the diagnosis.  In order to assure this is not true multiple myeloma we need to #1 determine the etiology of the anemia whether it is from renal dysfunction or from lack of production via the bone marrow and #2 assure that the patient does not have any  lytic lesions with a PET CT scan.    We will complete the full monoclonal gammopathy work-up with serum free light chains, UPEP, beta-2 glycoprotein, and LDH.  Once these findings have returned we will have a clear understanding of whether this is smoldering myeloma versus multiple myeloma and plan the treatment course moving forward.  Of note the patient is markedly deconditioned with numerous other medical conditions and I think that this may be limiting when considering starting treatment for multiple myeloma if that is what we find.  # Smoldering Multiple Myeloma vs Multiple Myeloma, workup underway.  --findings represent smoldering myeloma at a minimum, with the plasma cell population >20%.  --in order to r/o a diagnosis of MM will require bone evaluation with a PET CT scan. --additionally will complete the workup with UPEP, SFLC, Beta 2 microglobulin, LDH --will order erythropoietin and reticulocyte panel to help determine if the anemia is secondary to bone marrow infiltration vs declining kidney function. This would have significant impacts on the treatment plan moving forward.  --If no lytic lesions are noted and the patient's epo levels are low, then treatment of this smolder myeloma would be continued observation, based on the 20/2/20 criteria for smoldering myeloma.  --RTC in 3 months or sooner if findings are concerning for multiple myeloma.   No orders of the defined types were placed in this encounter.   All questions were answered. The patient knows to call the clinic with any problems, questions or concerns.  A total of more than 60 minutes were spent on this encounter and over half of that time was spent on counseling and coordination of care as outlined above.   Ledell Peoples, MD Department of Hematology/Oncology [place] at [facility] Phone: [phone] Pager: [phone] Email: [email or web address]  06/10/2020 1:19 PM

## 2020-06-11 ENCOUNTER — Telehealth: Payer: Self-pay | Admitting: Hematology and Oncology

## 2020-06-11 LAB — KAPPA/LAMBDA LIGHT CHAINS
Kappa free light chain: 20.3 mg/L — ABNORMAL HIGH (ref 3.3–19.4)
Kappa, lambda light chain ratio: 0.96 (ref 0.26–1.65)
Lambda free light chains: 21.1 mg/L (ref 5.7–26.3)

## 2020-06-11 LAB — BETA 2 MICROGLOBULIN, SERUM: Beta-2 Microglobulin: 5.5 mg/L — ABNORMAL HIGH (ref 0.6–2.4)

## 2020-06-11 LAB — ERYTHROPOIETIN: Erythropoietin: 46.5 m[IU]/mL — ABNORMAL HIGH (ref 2.6–18.5)

## 2020-06-11 NOTE — Telephone Encounter (Signed)
Scheduled per los. Called and left msg. Mailed printout  °

## 2020-06-12 LAB — MULTIPLE MYELOMA PANEL, SERUM
Albumin SerPl Elph-Mcnc: 3.2 g/dL (ref 2.9–4.4)
Albumin/Glob SerPl: 0.9 (ref 0.7–1.7)
Alpha 1: 0.3 g/dL (ref 0.0–0.4)
Alpha2 Glob SerPl Elph-Mcnc: 0.7 g/dL (ref 0.4–1.0)
B-Globulin SerPl Elph-Mcnc: 0.9 g/dL (ref 0.7–1.3)
Gamma Glob SerPl Elph-Mcnc: 1.7 g/dL (ref 0.4–1.8)
Globulin, Total: 3.6 g/dL (ref 2.2–3.9)
IgA: 84 mg/dL — ABNORMAL LOW (ref 87–352)
IgG (Immunoglobin G), Serum: 2135 mg/dL — ABNORMAL HIGH (ref 586–1602)
IgM (Immunoglobulin M), Srm: 54 mg/dL (ref 26–217)
M Protein SerPl Elph-Mcnc: 1.5 g/dL — ABNORMAL HIGH
Total Protein ELP: 6.8 g/dL (ref 6.0–8.5)

## 2020-06-17 DIAGNOSIS — C9000 Multiple myeloma not having achieved remission: Secondary | ICD-10-CM | POA: Diagnosis not present

## 2020-06-18 LAB — UPEP/UIFE/LIGHT CHAINS/TP, 24-HR UR
% BETA, Urine: 0 %
ALPHA 1 URINE: 0 %
Albumin, U: 100 %
Alpha 2, Urine: 0 %
Free Kappa Lt Chains,Ur: 23.96 mg/L (ref 0.63–113.79)
Free Kappa/Lambda Ratio: 4.36 (ref 1.03–31.76)
Free Lambda Lt Chains,Ur: 5.5 mg/L (ref 0.47–11.77)
GAMMA GLOBULIN URINE: 0 %
Total Protein, Urine-Ur/day: 143 mg/24 hr (ref 30–150)
Total Protein, Urine: 6.8 mg/dL
Total Volume: 2100

## 2020-06-19 ENCOUNTER — Encounter: Payer: Self-pay | Admitting: Internal Medicine

## 2020-06-19 ENCOUNTER — Telehealth (INDEPENDENT_AMBULATORY_CARE_PROVIDER_SITE_OTHER): Payer: Medicare Other | Admitting: Internal Medicine

## 2020-06-19 ENCOUNTER — Other Ambulatory Visit: Payer: Self-pay

## 2020-06-19 DIAGNOSIS — R509 Fever, unspecified: Secondary | ICD-10-CM | POA: Diagnosis not present

## 2020-06-19 DIAGNOSIS — D8989 Other specified disorders involving the immune mechanism, not elsewhere classified: Secondary | ICD-10-CM | POA: Diagnosis not present

## 2020-06-19 NOTE — Progress Notes (Signed)
   Subjective:    Patient ID: LYDIAN CHAVOUS, female    DOB: 01-20-1956, 64 y.o.   MRN: [id number]  HPI I connected with  BROOKLEN RUNQUIST on 06/19/20 by a video enabled telemedicine application and verified that I am speaking with the correct person using two identifiers.   I discussed the limitations of evaluation and management by telemedicine. The patient expressed understanding and agreed to proceed.  Patient location: nursing home Provider location: ID clinic  I am seeing her for [facility].  She was admitted earlier this month with a reoccurence of her symptoms of high fever, sob which resolved again.  This was the 11th admission.  Her work up did though reveal findings concerning for Multiple Myeloma and she is now being followed by Dr. Lorenso Courier of Heme/Onc.  She has had no further episodes of fever, though currently is on oxygen for a asthma exacerbation.  She otherwise feels about at her baseline.     Review of Systems  Constitutional: Negative for chills and fever.  Gastrointestinal: Negative for nausea.  Skin: Negative for rash.       Objective:   Physical Exam Eyes:     General: No scleral icterus. Neurological:     Mental Status: She is alert.  Psychiatric:        Mood and Affect: Mood normal.   SH: continues living in  NH facility        Assessment & Plan:

## 2020-06-19 NOTE — Assessment & Plan Note (Signed)
She remains fever free for now.  I mostly suspect the fever is due to her underlying hematologic diagnosis with no other findings for her intermittent fever.  No other signs of infection and no new symptoms at this time.   She can follow up with me as needed or if new fever develops, though she was instructed to go to the ED for concerning symptoms including sob, hypoxia associated with the fever.

## 2020-06-19 NOTE — Addendum Note (Signed)
Addended by: Thayer Headings on: 06/19/2020 01:35 PM   Modules accepted: Level of Service

## 2020-06-19 NOTE — Assessment & Plan Note (Signed)
She is continuing her work up via hematology.  She is to schedule for a PET scan.

## 2020-06-24 ENCOUNTER — Telehealth: Payer: Self-pay | Admitting: *Deleted

## 2020-06-24 NOTE — Telephone Encounter (Signed)
TCT patient to provide her with phone # for [facility] so she can schedule her PET scan.  Pt was discharged from rehab/[facility] last Thursday, 06/20/20.

## 2020-06-26 ENCOUNTER — Telehealth: Payer: Self-pay | Admitting: Hematology and Oncology

## 2020-06-26 IMAGING — DX DG CHEST 1V PORT
1 series · 1 of 1 positions shown · non-contrast
Comparison: Radiographs 10/13/2018

CLINICAL DATA: Stated history of postop. Patient reports preop knee
surgery.

EXAM:
PORTABLE CHEST 1 VIEW

[chest ap]
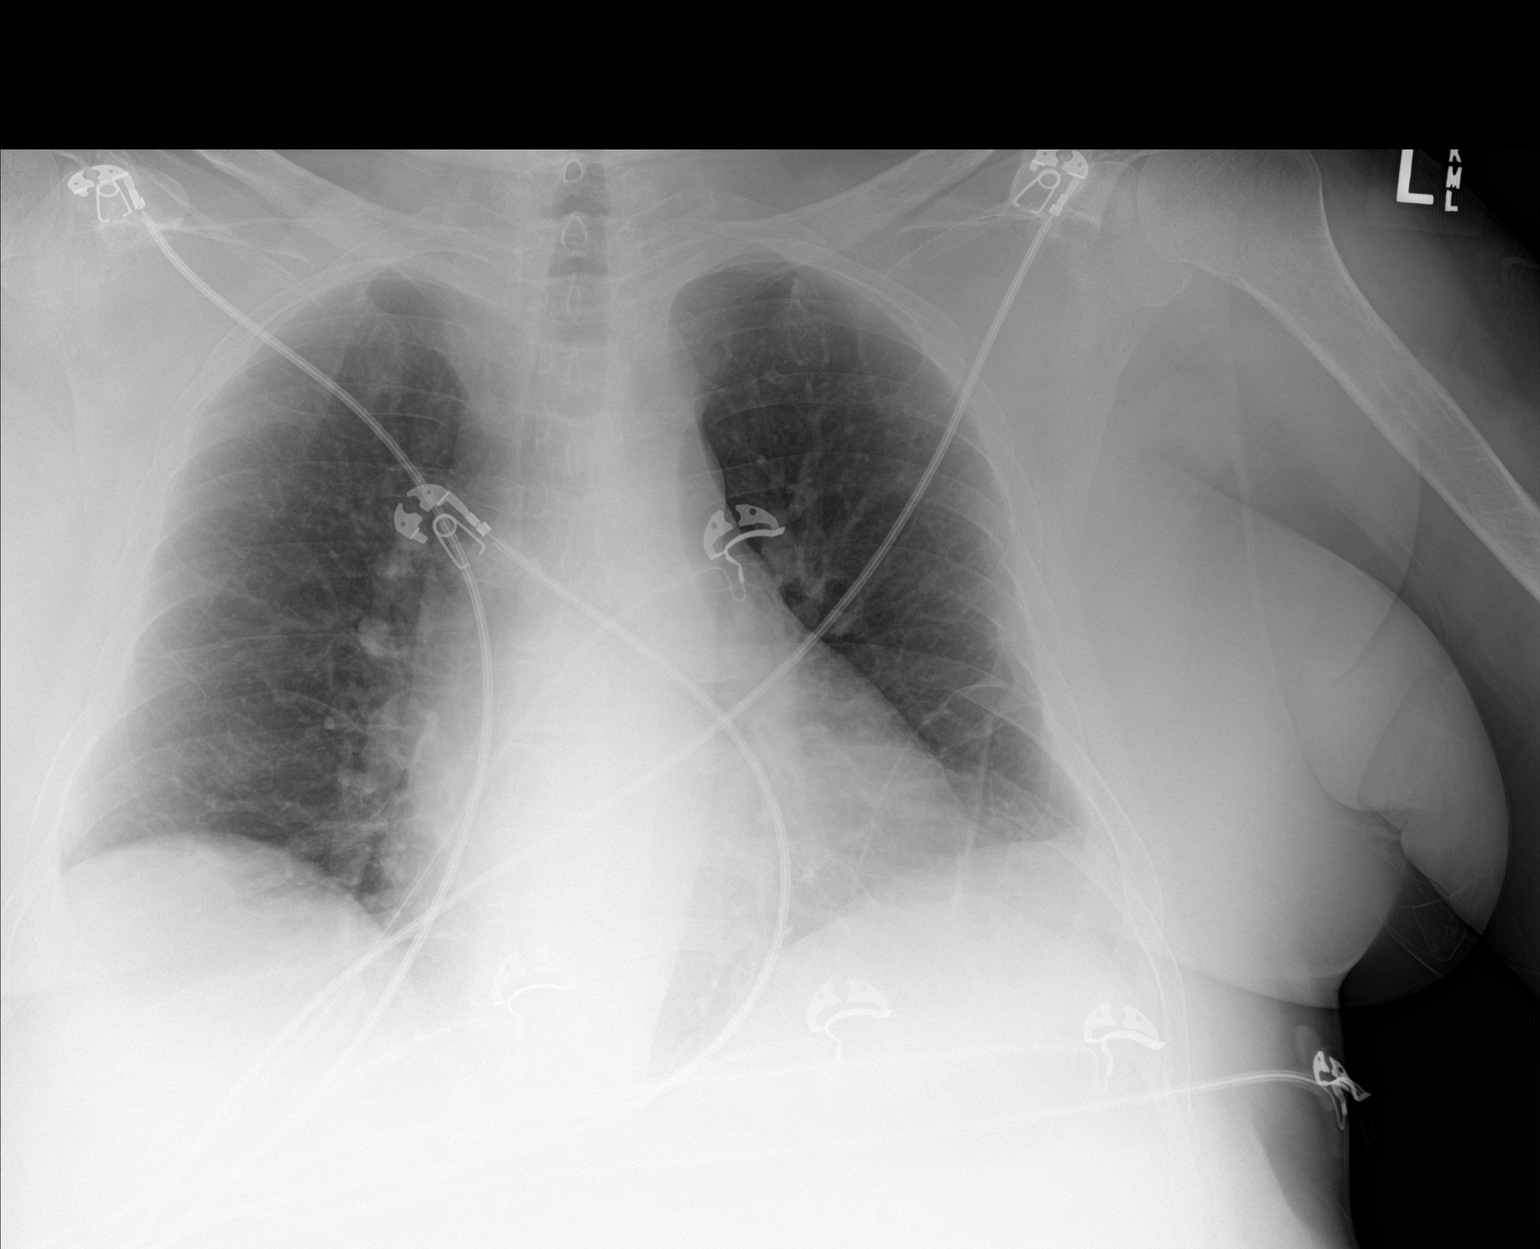

[1 of 1 positions shown; findings below may reference images not displayed]

FINDINGS: The cardiomediastinal contours are unchanged. Streaky left lung base
scarring, unchanged from prior. Pulmonary vasculature is normal. No
consolidation, pleural effusion, or pneumothorax. No acute osseous
abnormalities are seen.
IMPRESSION: Left lung base scarring.  No acute chest findings.

## 2020-06-26 IMAGING — CR DG WRIST COMPLETE 3+V*R*
4 series · 4 of 4 positions shown · non-contrast
Comparison: RIGHT wrist radiograph October 13, 2014.

CLINICAL DATA: Trip and fall RIGHT wrist pain.

EXAM:
RIGHT WRIST - COMPLETE 3+ VIEW

[x wrist pa right]
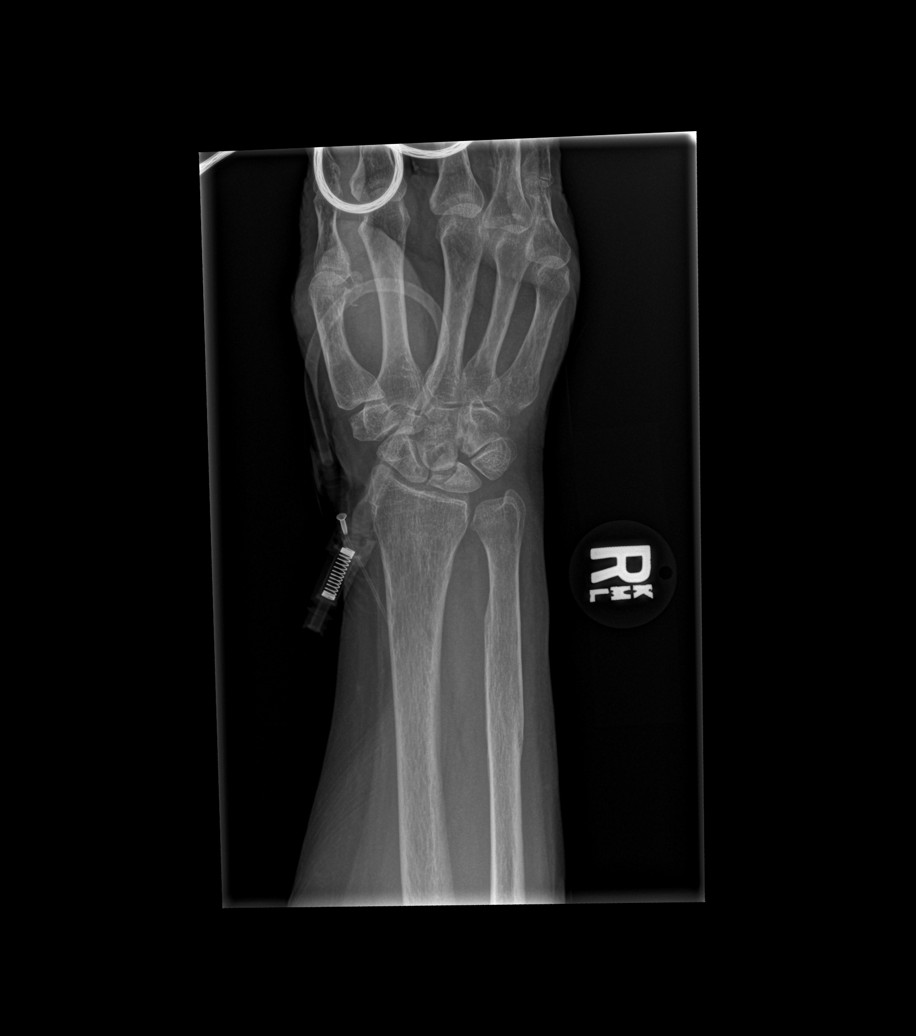

[x wrist obl right]
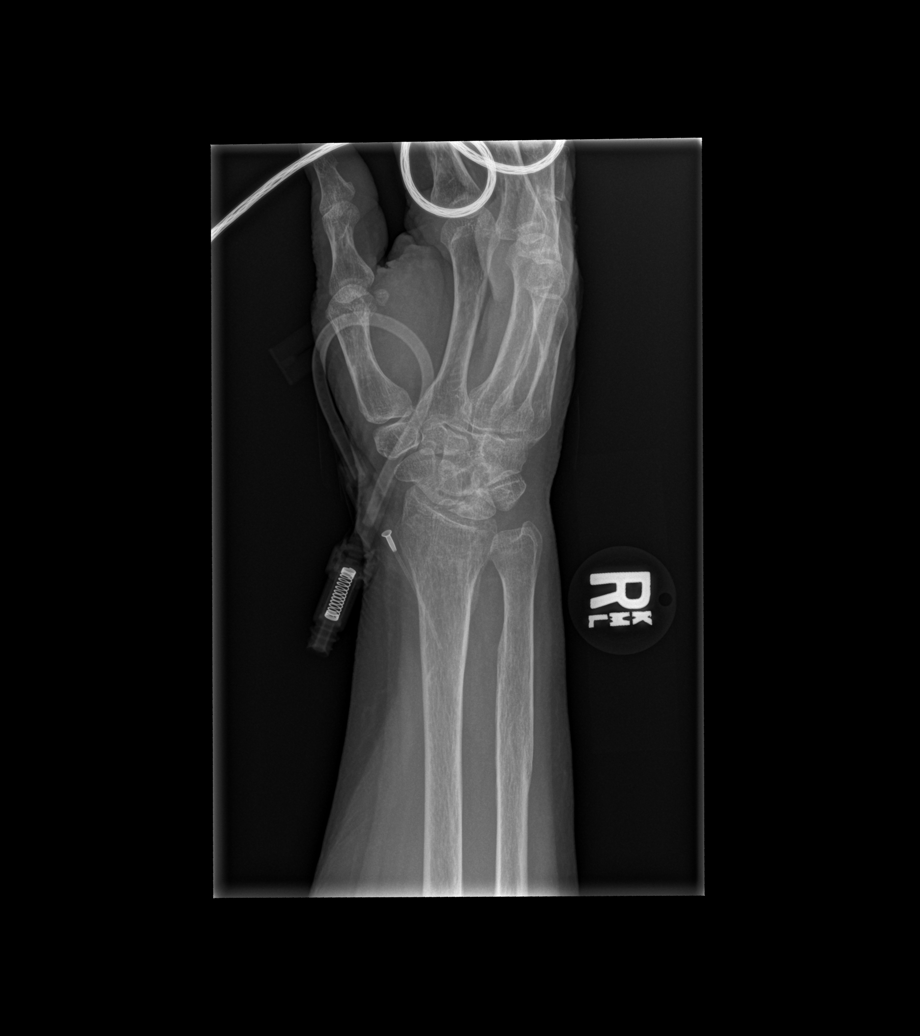

[x wrist lat right]
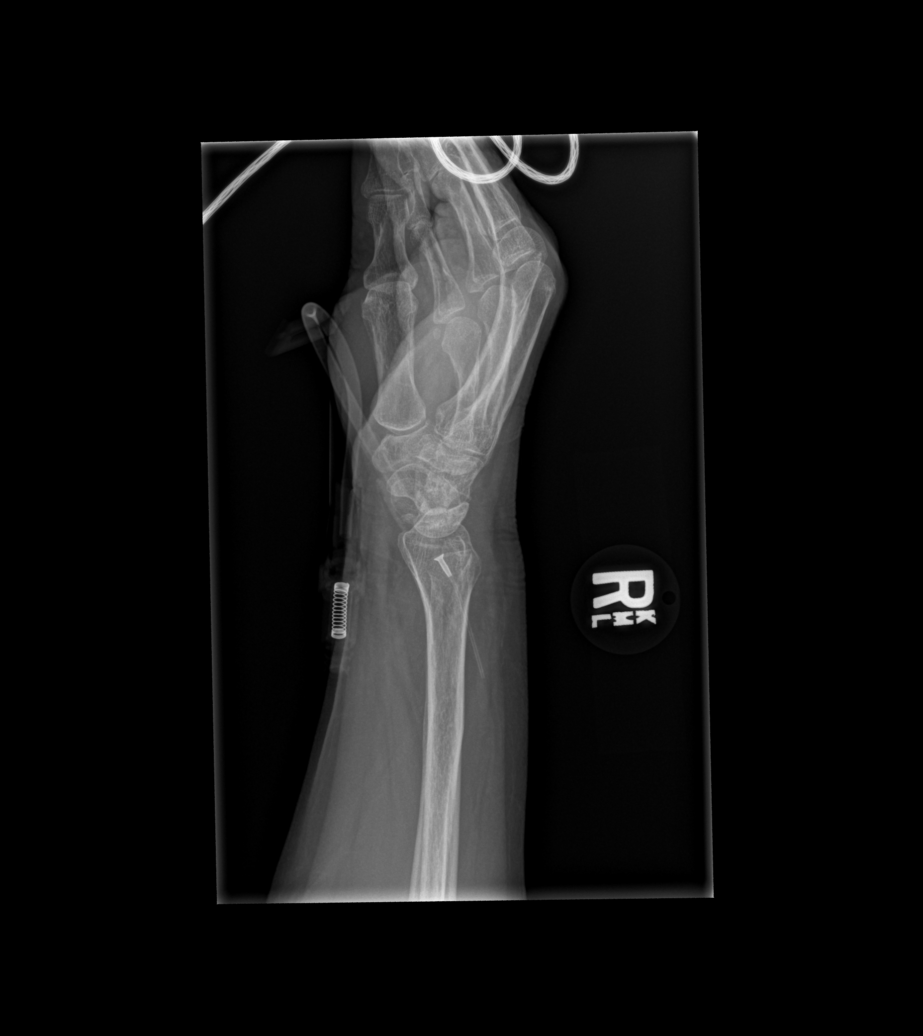

[x wrist navicular view right]
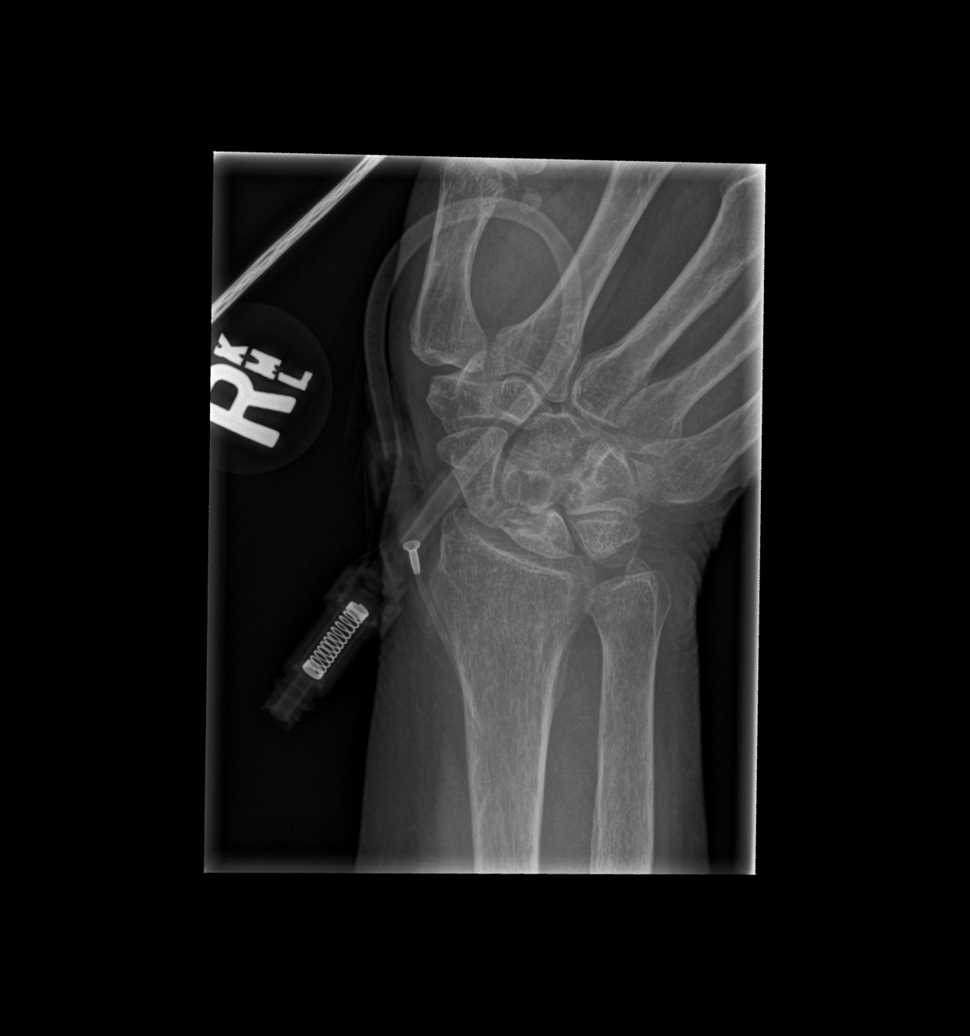

[4 of 4 positions shown; findings below may reference images not displayed]

FINDINGS: There is no evidence of fracture or dislocation. There is no
evidence of arthropathy or other focal bone abnormality. Osteopenia.
Intravenous catheter in place. Soft tissues are unremarkable.
IMPRESSION: 1. No acute fracture deformity or dislocation.
2. Osteopenia decreases sensitivity for acute nondisplaced
fractures.

## 2020-06-26 IMAGING — CR DG KNEE COMPLETE 4+V*R*
4 series · 4 of 4 positions shown · non-contrast
Comparison: None.

CLINICAL DATA: Trip and fall onto knee, deformity.

EXAM:
RIGHT KNEE - COMPLETE 4+ VIEW

[x knee ap right (1 of 3)]
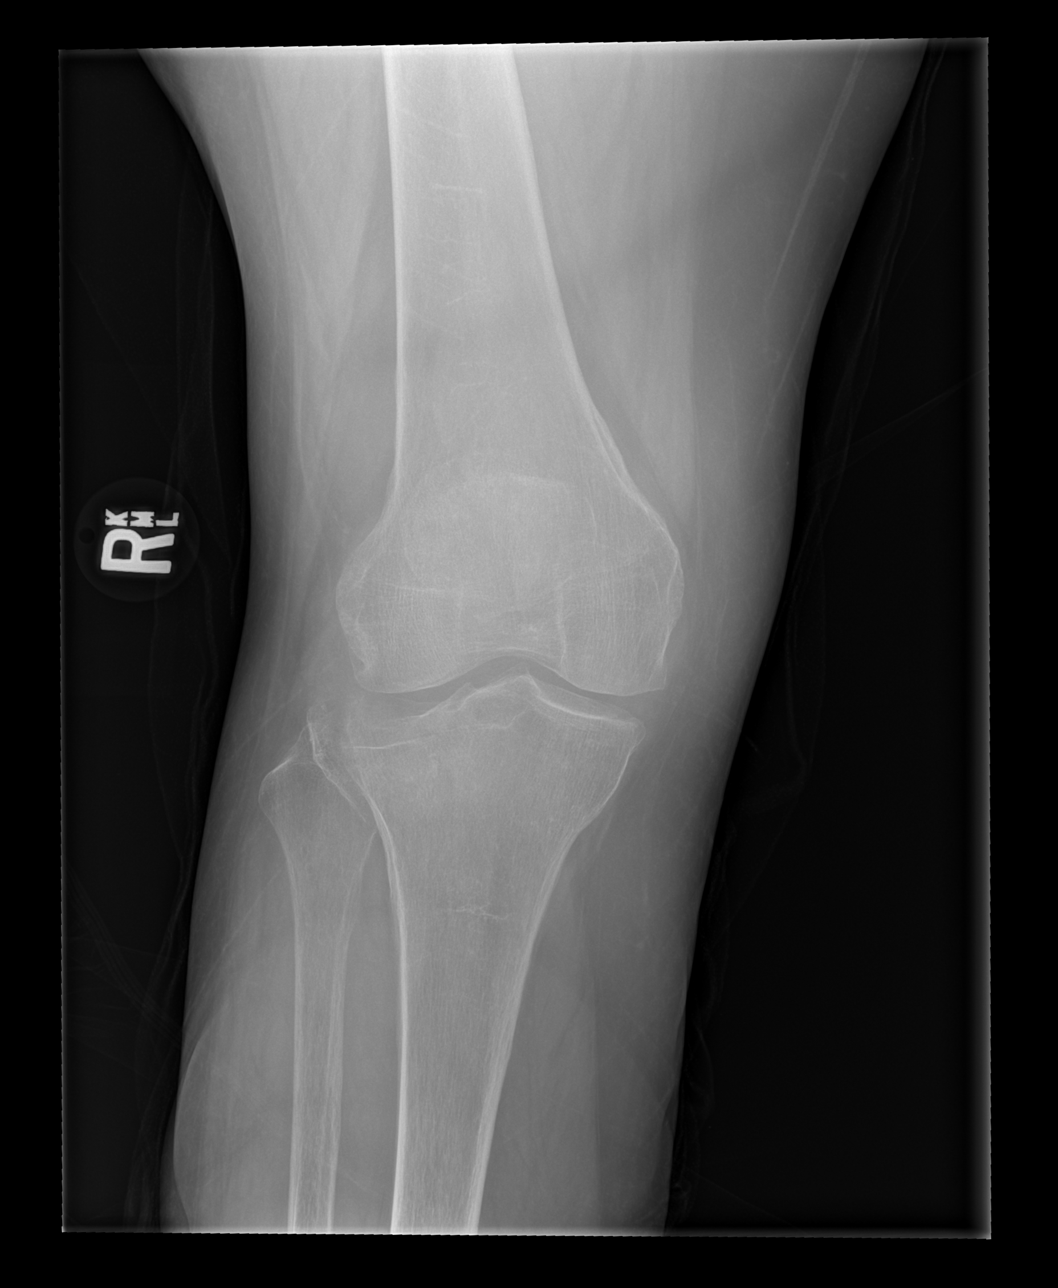

[x knee ap right (2 of 3)]
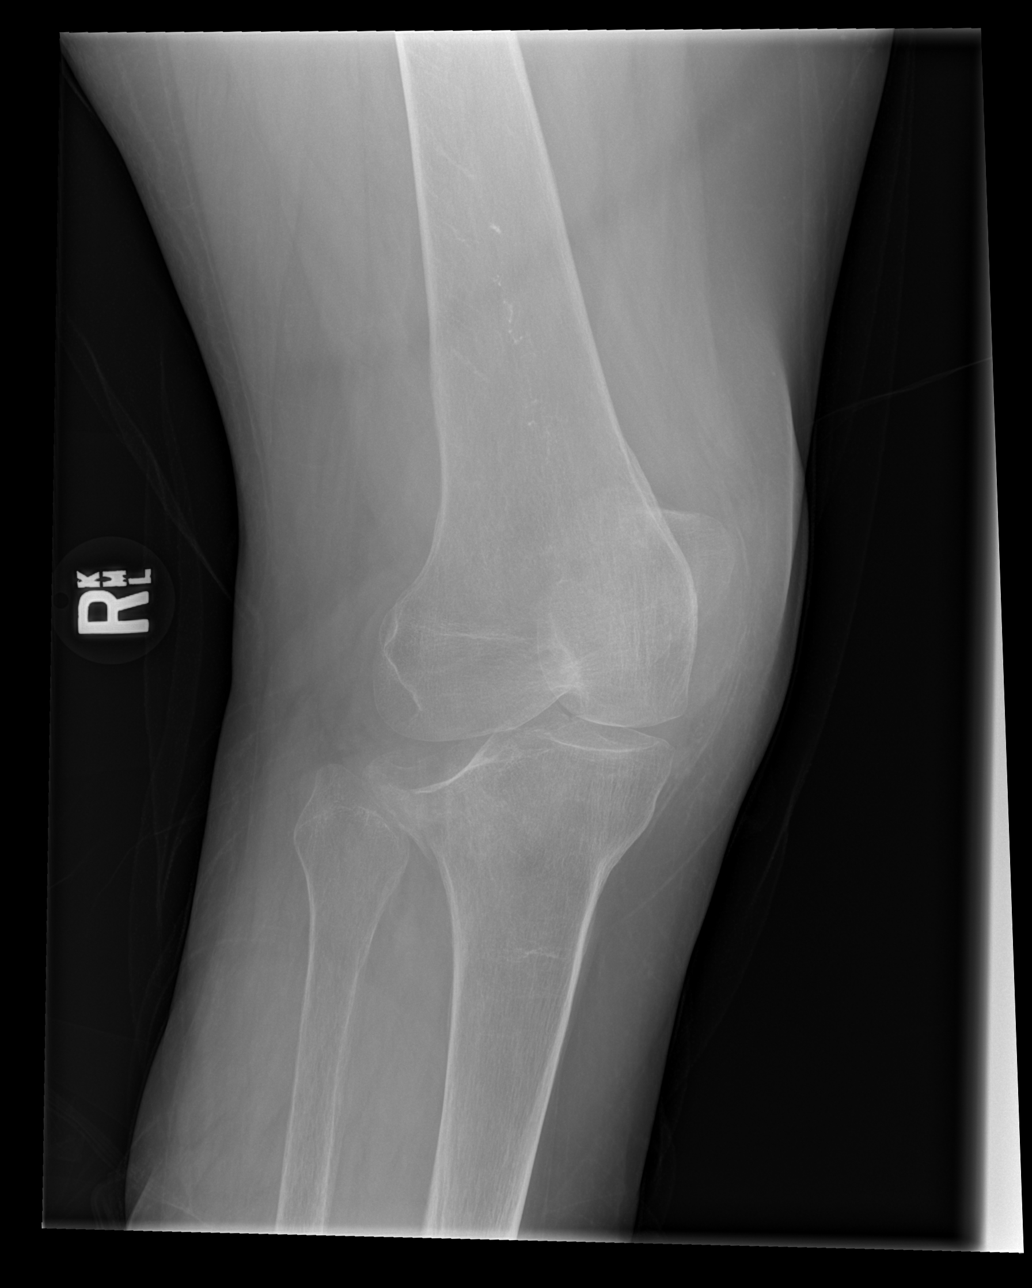

[x knee ap right (3 of 3)]
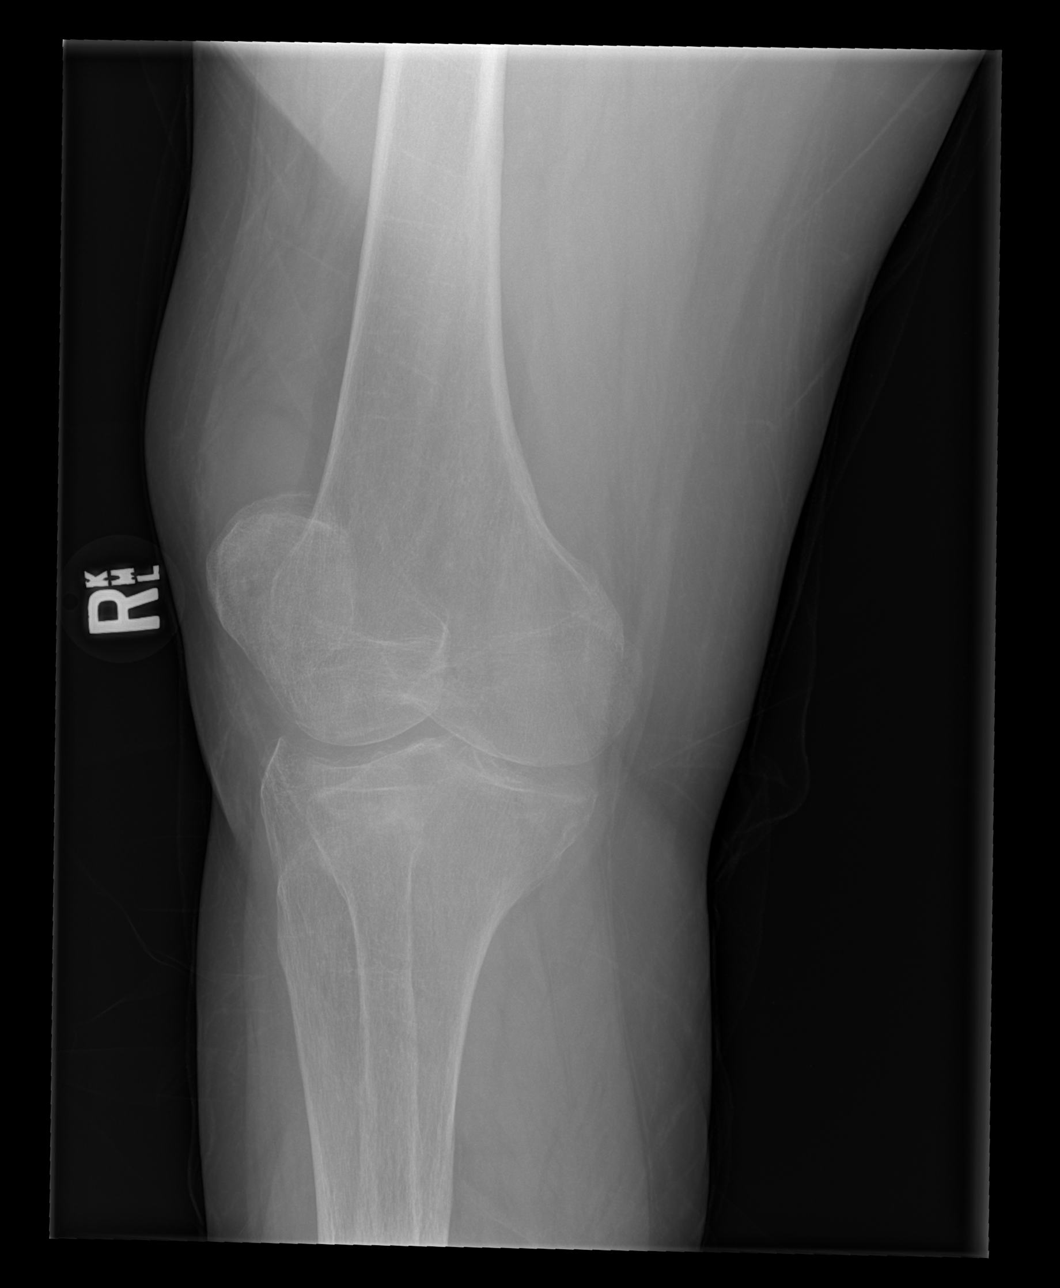

[x knee lat right]
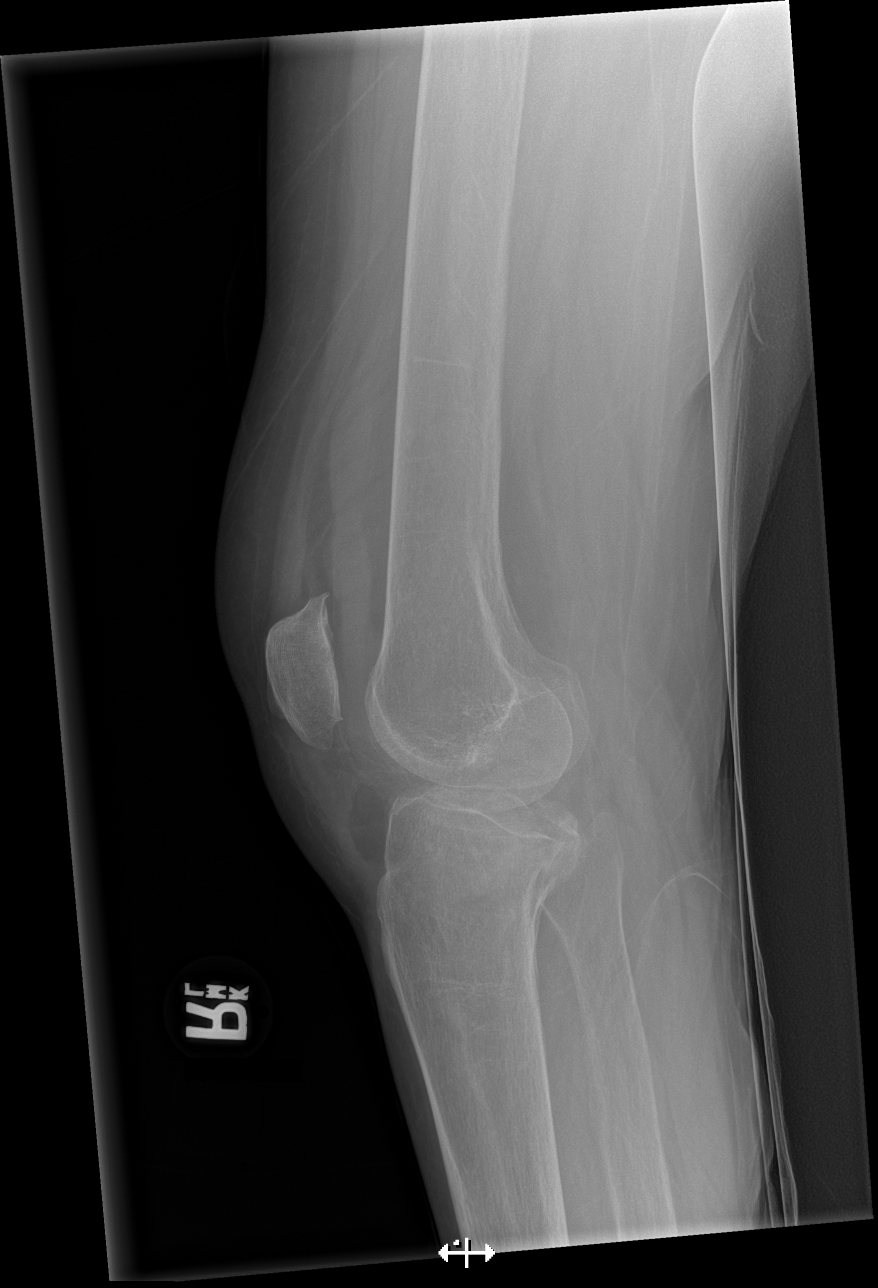

[4 of 4 positions shown; findings below may reference images not displayed]

FINDINGS: Acute mildly depressed lateral tibial plateau fracture. Linear
lucency through medial tibial plateau. No dislocation. No
destructive bony lesions. Osteopenia. Lipohemarthrosis suspected.
IMPRESSION: 1. Acute depressed lateral tibial plateau fracture. Potential
nondisplaced medial plateau component. No dislocation.
2. Osteopenia decreases sensitivity for acute nondisplaced
fractures.

## 2020-06-26 IMAGING — CR DG ANKLE 2V *L*
2 series · 2 of 2 positions shown · non-contrast
Comparison: None.

CLINICAL DATA: Fall

EXAM:
LEFT ANKLE - 2 VIEW

[x ankle lat left]
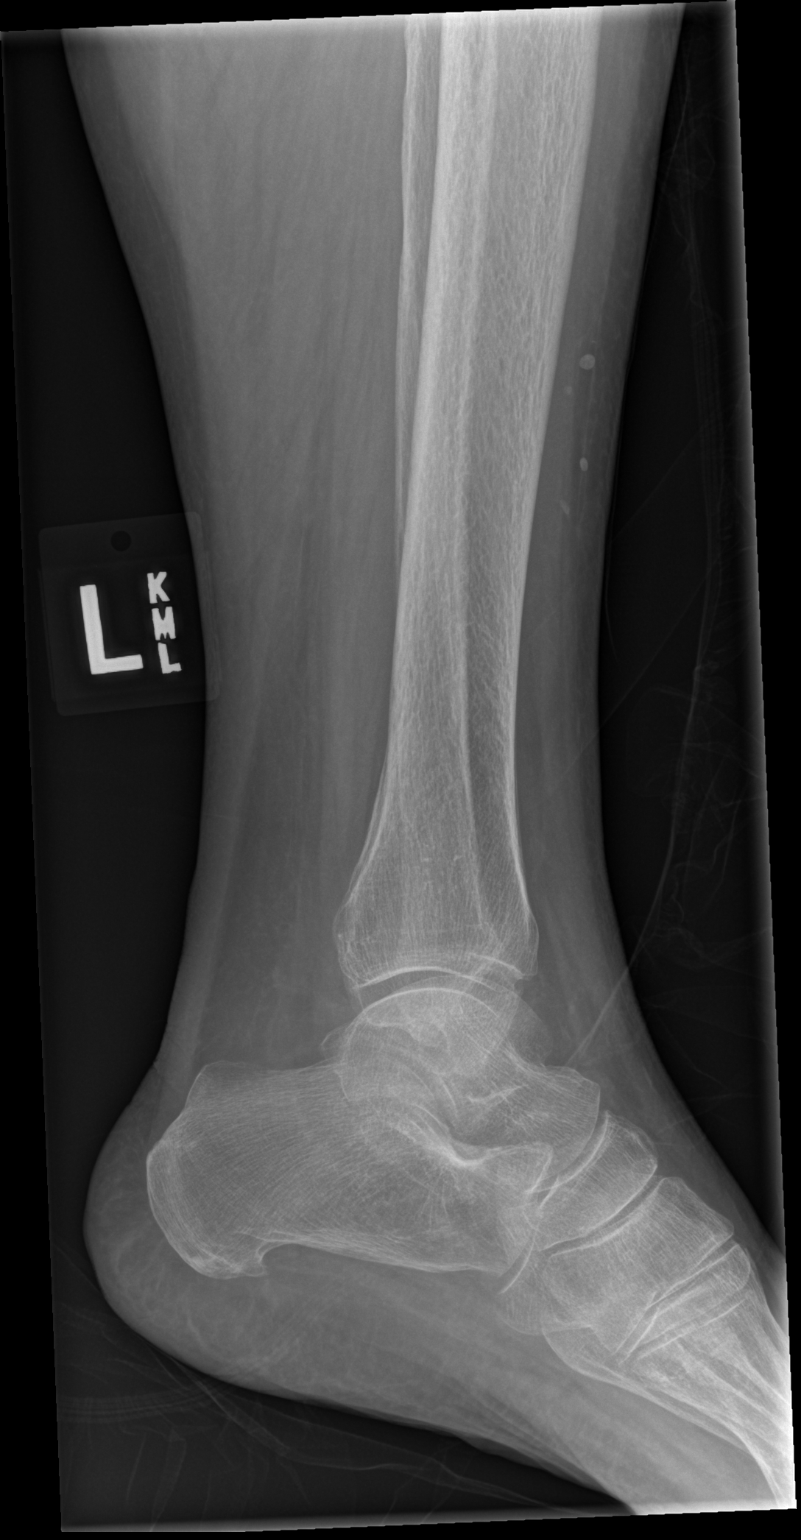

[x ankle ap left]
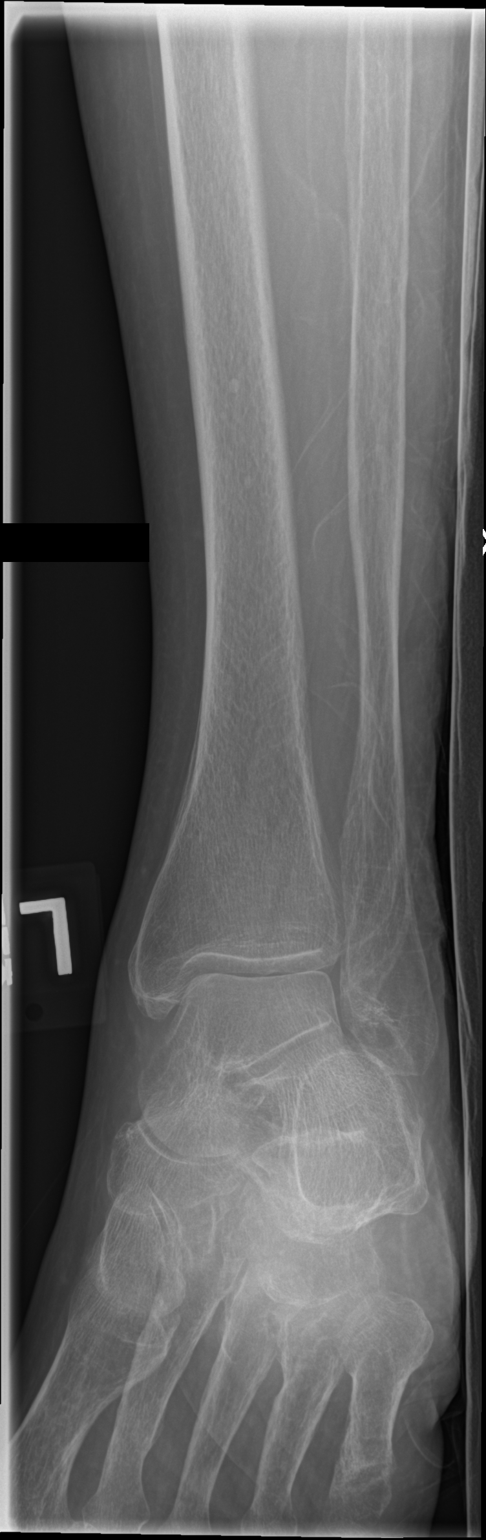

[2 of 2 positions shown; findings below may reference images not displayed]

FINDINGS: There is no evidence of fracture, dislocation, or joint effusion.
There is no evidence of arthropathy or other focal bone abnormality.
Soft tissues are unremarkable.
IMPRESSION: Negative.

## 2020-06-26 IMAGING — CT CT KNEE*L* W/O CM
4 of 5 series · 17 of 33 positions shown, 19 images · non-contrast
Comparison: Radiographs earlier this day.

CLINICAL DATA: Post fall with bilateral knee pain. Bilateral knee
fractures.

EXAM:
CT OF THE LEFT KNEE WITHOUT CONTRAST
TECHNIQUE: Multidetector CT imaging of the LEFT knee was performed according to
the standard protocol. Multiplanar CT image reconstructions were
also generated.

[Series 6: axialst right · axial · 0.44mm/px · z∈[+375,+579]mm · 5 of 205 slices shown, 7 images]
[im 35/205  soft-tissue]
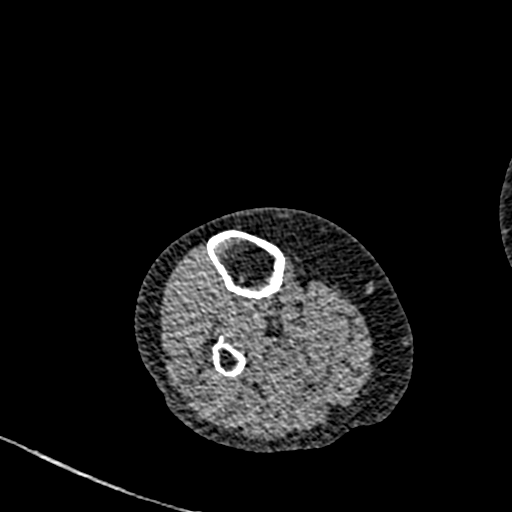
[im 35/205  bone]
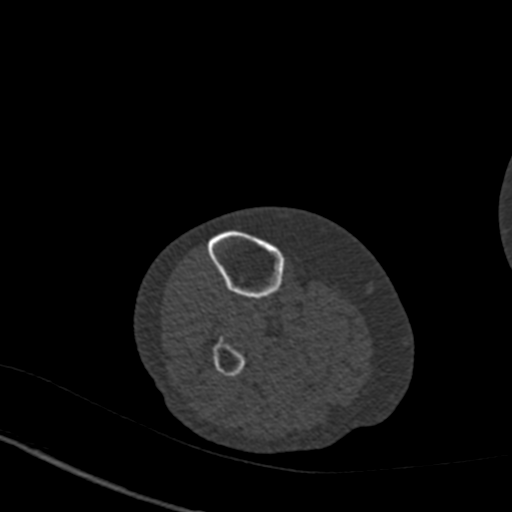
[im 69/205  bone]
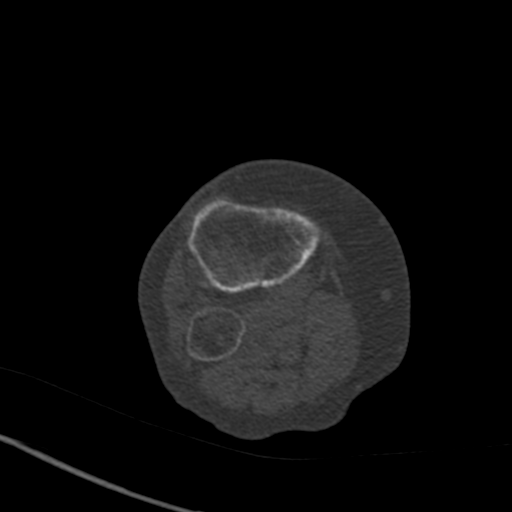
[im 103/205  bone]
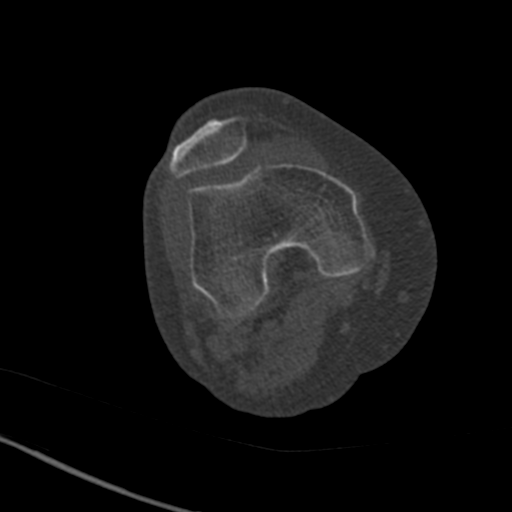
[im 137/205  bone]
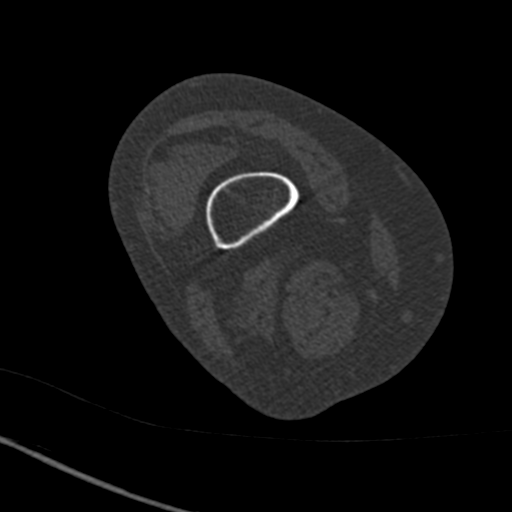
[im 171/205  soft-tissue]
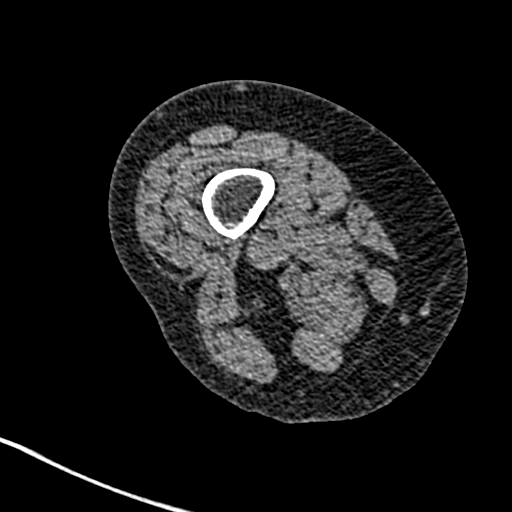
[im 171/205  bone]
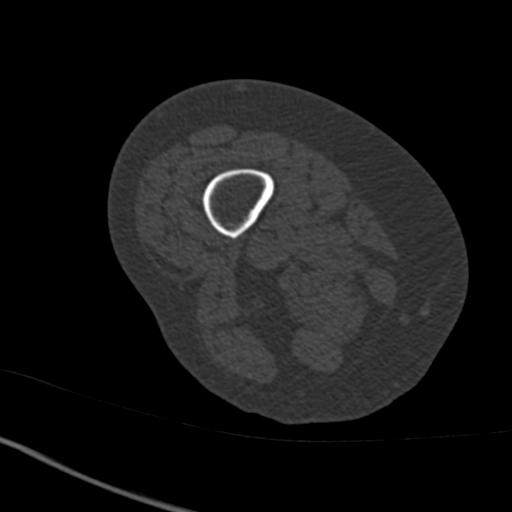

[Series 7: axial stleft · axial · 0.50mm/px · z∈[+375,+528]mm · 4 of 205 slices shown]
[im 35/205  bone]
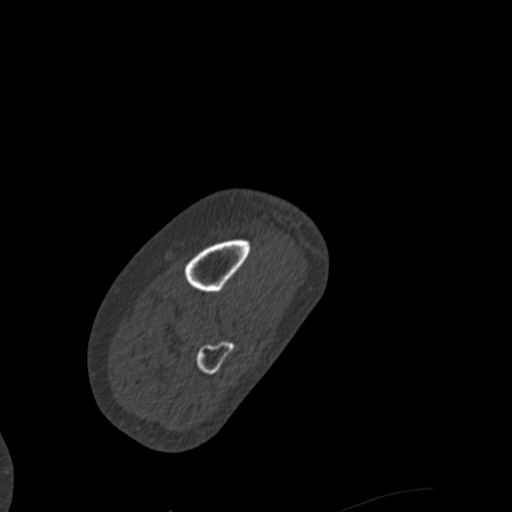
[im 69/205  bone]
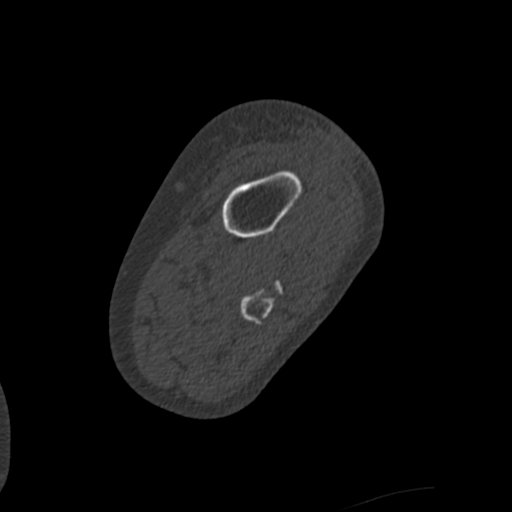
[im 103/205  bone]
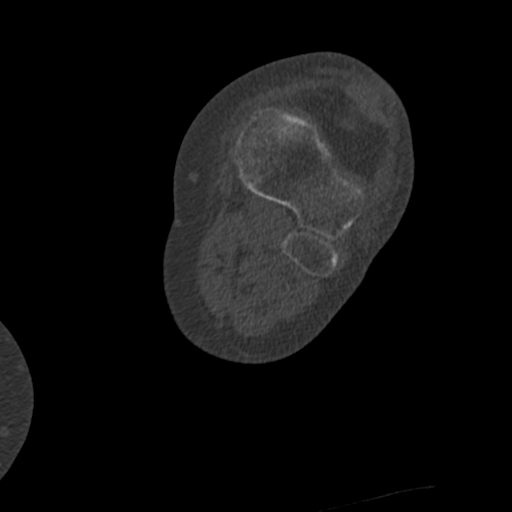
[im 137/205  bone]
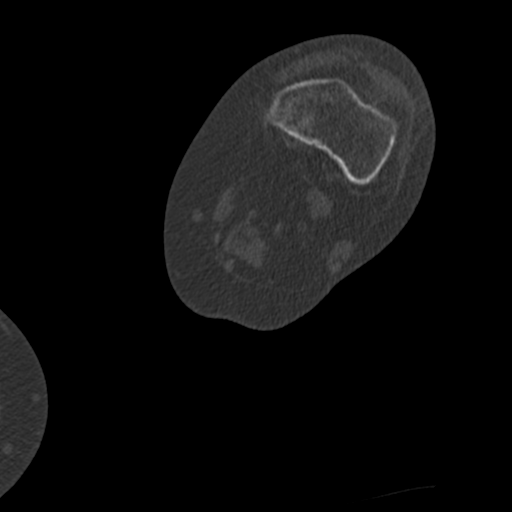

[Series 602: right cor bone · coronal · 0.60mm/px · 3 of 68 slices shown]
[im 14/68  bone]
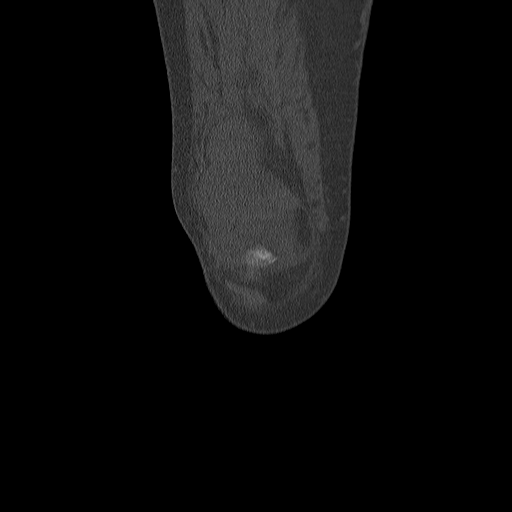
[im 27/68  bone]
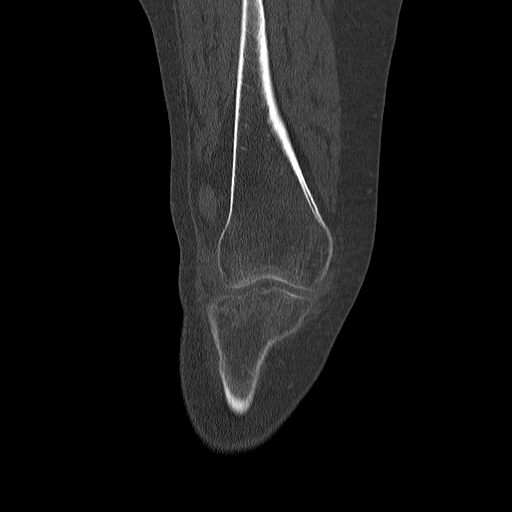
[im 41/68  bone]
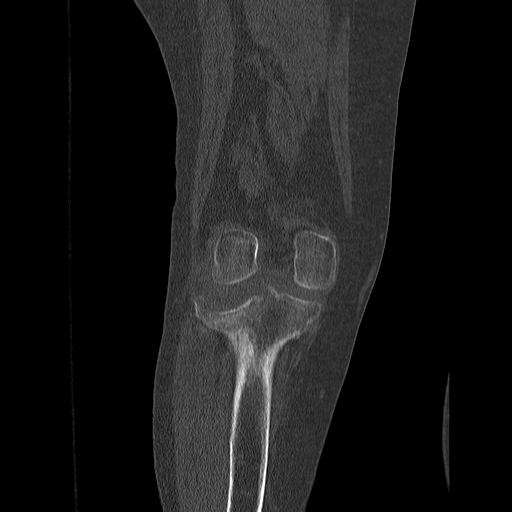

[Series 603: sag bone right · sagittal · 0.60mm/px · 5 of 74 slices shown]
[im 13/74  bone]
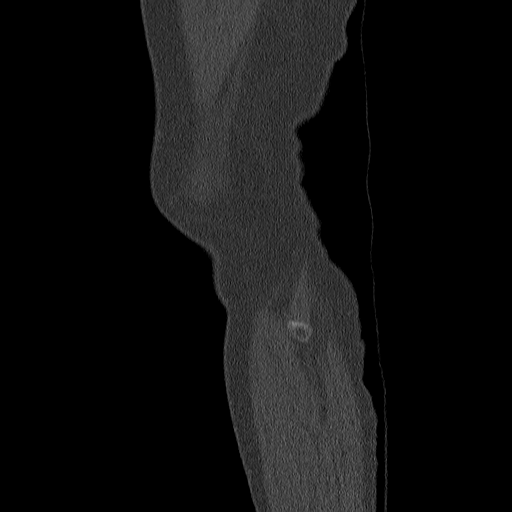
[im 25/74  bone]
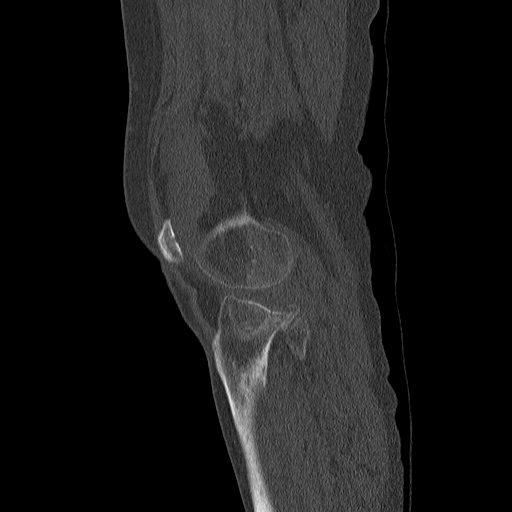
[im 37/74  bone]
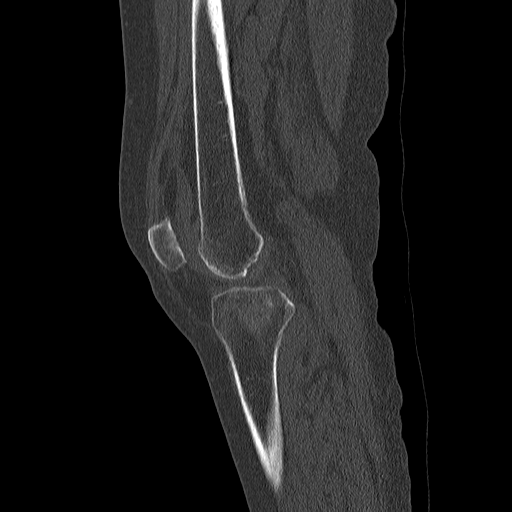
[im 49/74  bone]
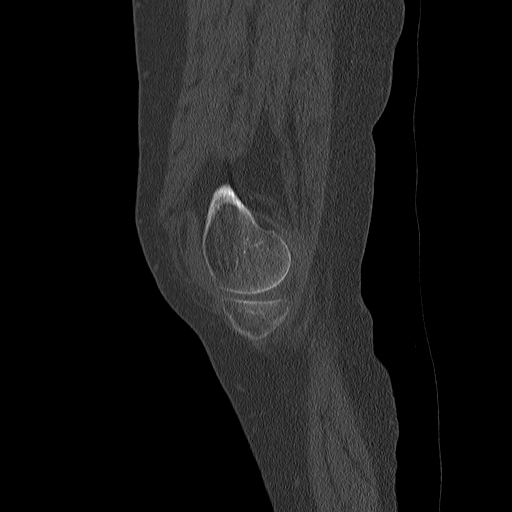
[im 61/74  bone]
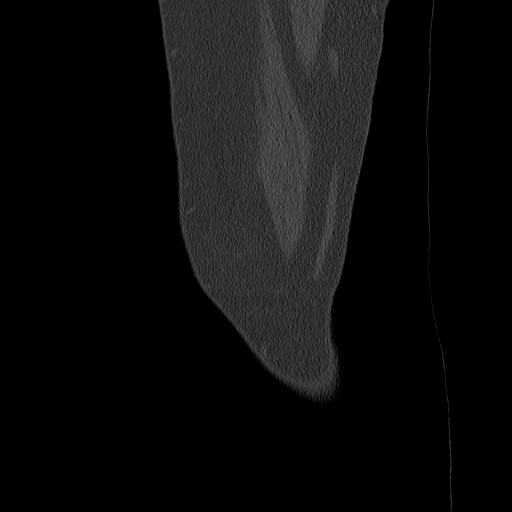

[17 of 33 positions shown; findings below may reference images not displayed]

FINDINGS: Bones/Joint/Cartilage

Comminuted displaced fracture of the proximal tibial metaphysis.
Displacement anteriorly of approximately 9 mm. Fracture approaches
but does not extend to the articular surface laterally. There is
fracture involvement of the tibial attachment of the patellar
tendon. Comminuted displaced proximal fibular diaphyseal fracture.
No involvement of the fibular head. Patella and distal femur are
intact. There is no knee joint effusion.

Ligaments

Suboptimally assessed by CT. ACL and PCL fibers are visualized.

Muscles and Tendons

Hematoma superficial to the patellar tendon, with fracture at the
tibial insertion. Quadriceps tendon is intact. No intramuscular
collection.

Soft tissues

Soft tissue hematoma anteriorly extending from the patellar tendon
to the proximal tibial diaphysis.
IMPRESSION: 1. Comminuted displaced fracture of the proximal tibial metaphysis.
Displacement anteriorly of approximately 9 mm, involving the
patellar tendon insertion. Fracture approaches but does not extend
to the articular surface laterally.
2. Comminuted displaced proximal fibular diaphyseal fracture.

## 2020-06-26 IMAGING — CR DG KNEE COMPLETE 4+V*L*
3 series · 3 of 3 positions shown · non-contrast
Comparison: None.

CLINICAL DATA: Fall with knee pain

EXAM:
LEFT KNEE - COMPLETE 4+ VIEW

[x knee lat left]
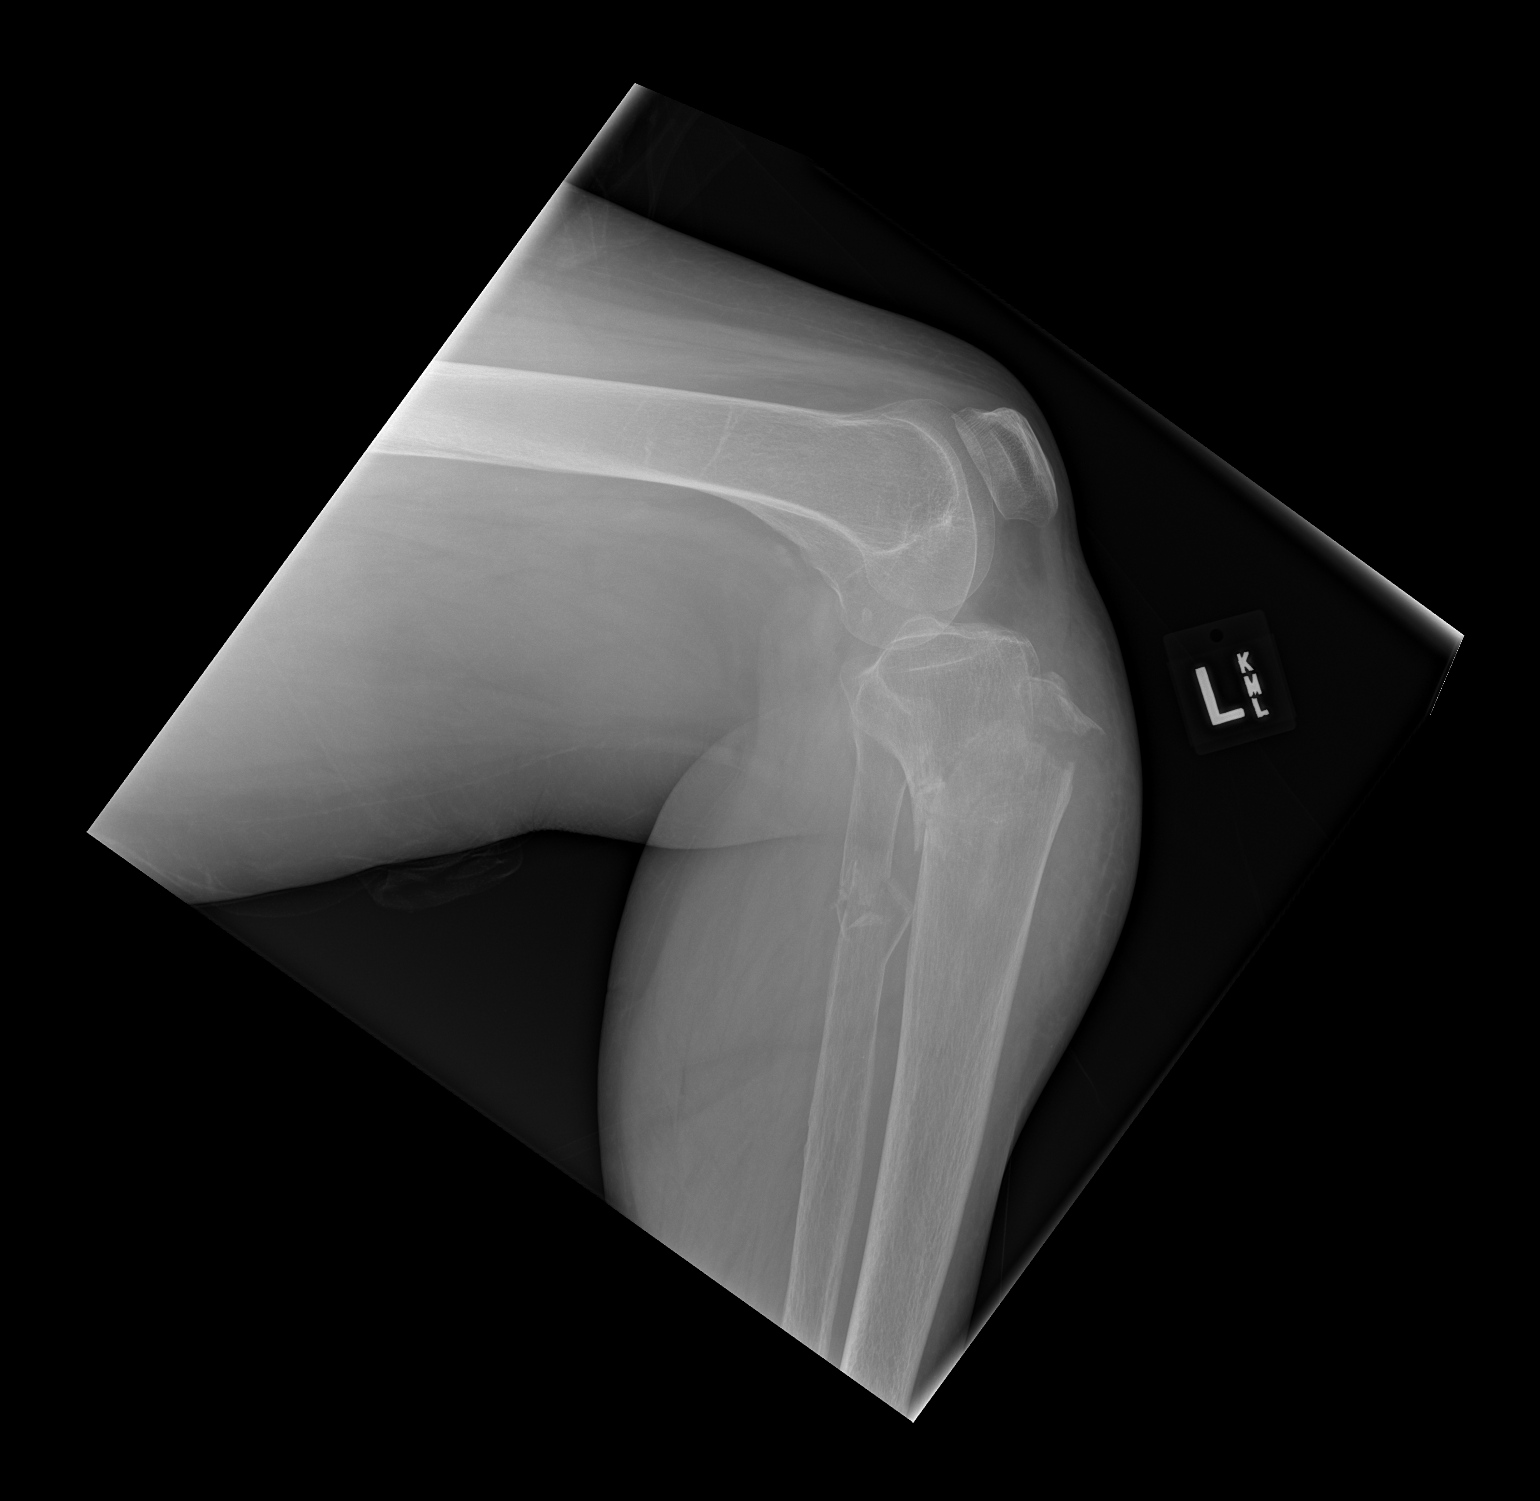

[x knee ap left (1 of 2)]
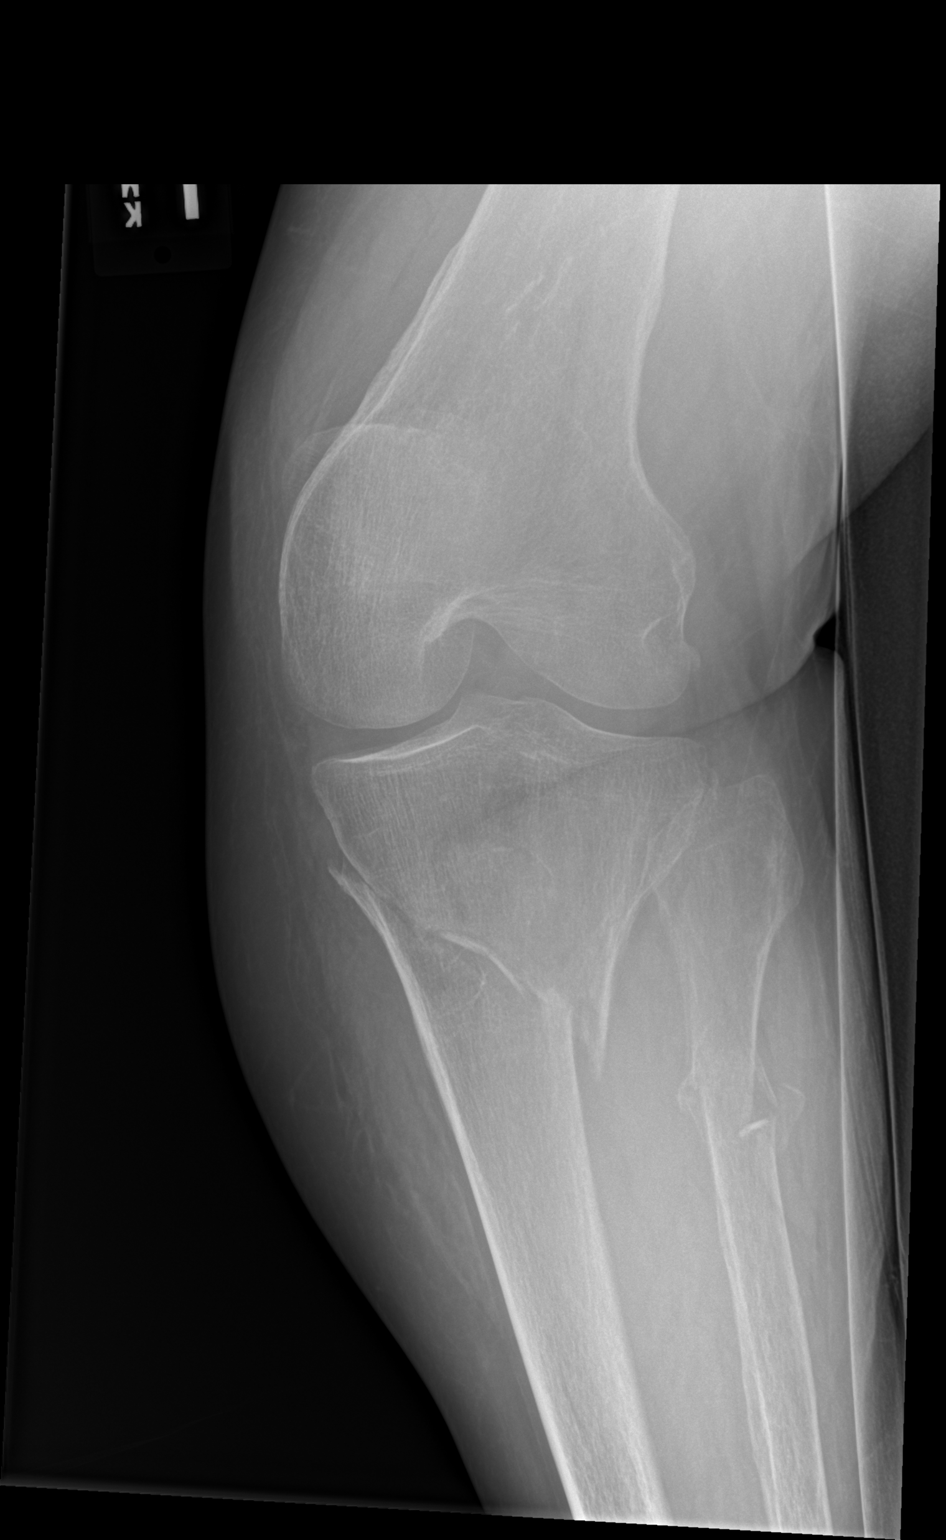

[x knee ap left (2 of 2)]
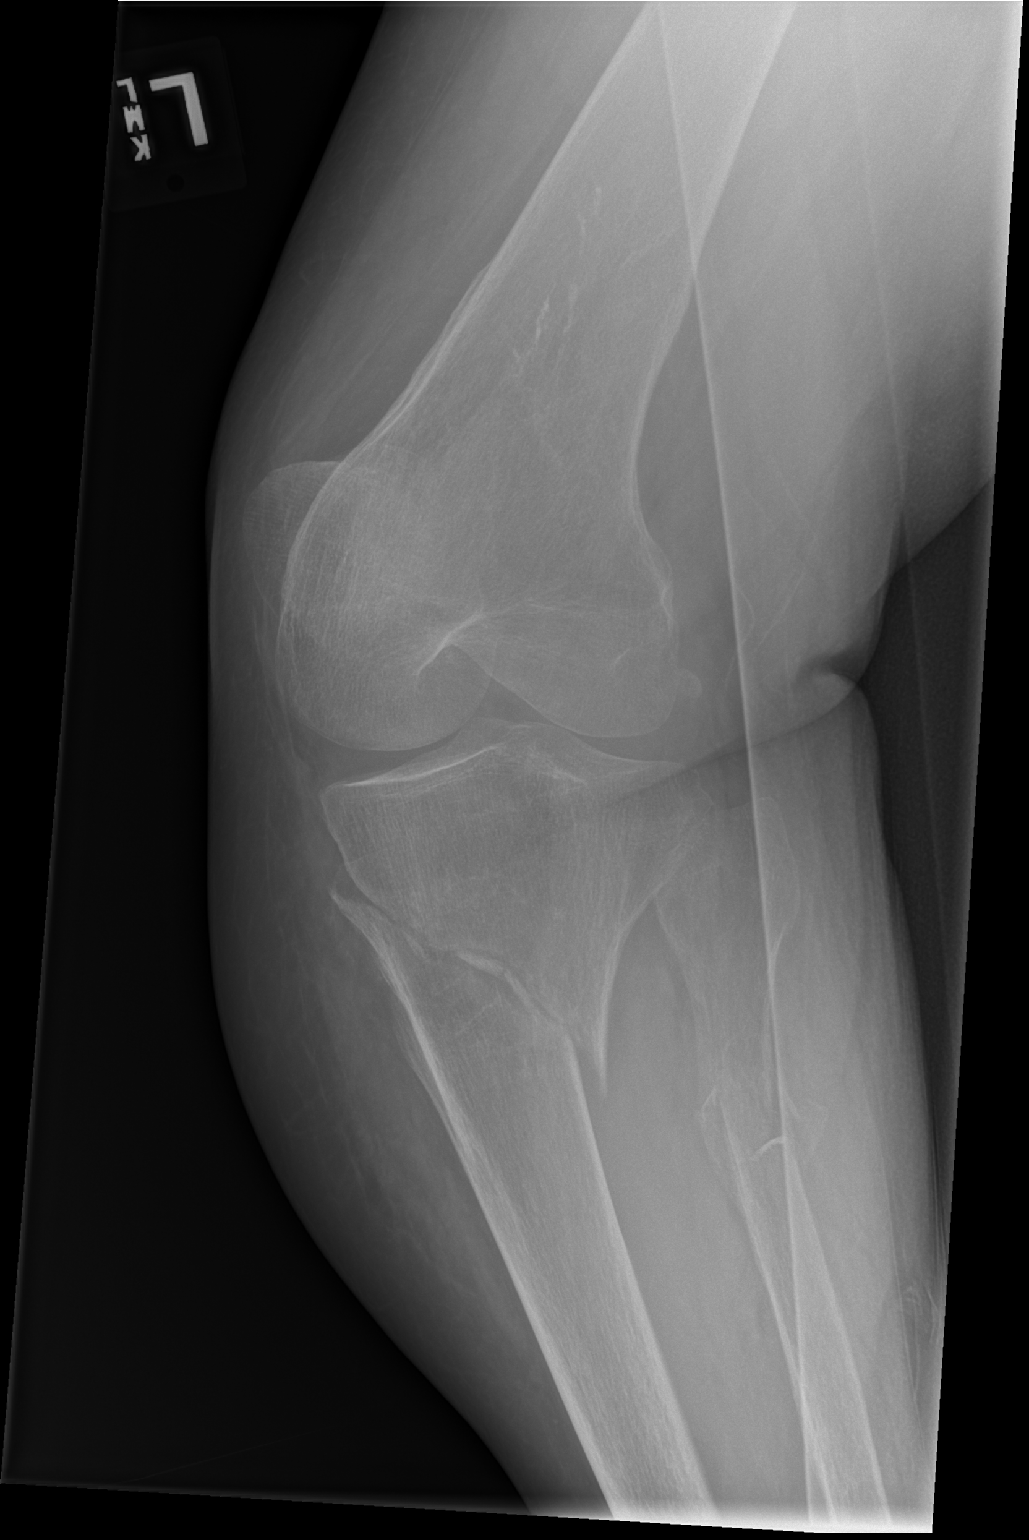

[3 of 3 positions shown; findings below may reference images not displayed]

FINDINGS: Oblique fracture of the proximal left tibia with mild medial
displacement. No intra-articular extension. There is also a
comminuted fracture of the proximal shaft of the left fibula.
Minimal displacement. The knee remains approximated.
IMPRESSION: Extra-articular fractures of the proximal tibia and fibula, with
mild medial displacement of the tibia fracture.

## 2020-06-26 NOTE — Telephone Encounter (Signed)
Scheduled appt per 8/4 sch msg - pt is aware of appt date and time

## 2020-06-27 ENCOUNTER — Inpatient Hospital Stay: Payer: Medicare Other | Admitting: Pulmonary Disease

## 2020-06-28 IMAGING — DX DG TIBIA/FIBULA PORT 2V*L*
4 series · 4 of 4 positions shown · non-contrast
Comparison: Intraoperative images [id number]

CLINICAL DATA: BILATERAL tibial plateau fractures post surgery

EXAM:
PORTABLE LEFT TIBIA AND FIBULA - 2 VIEW

[tibia ap (1 of 2)]
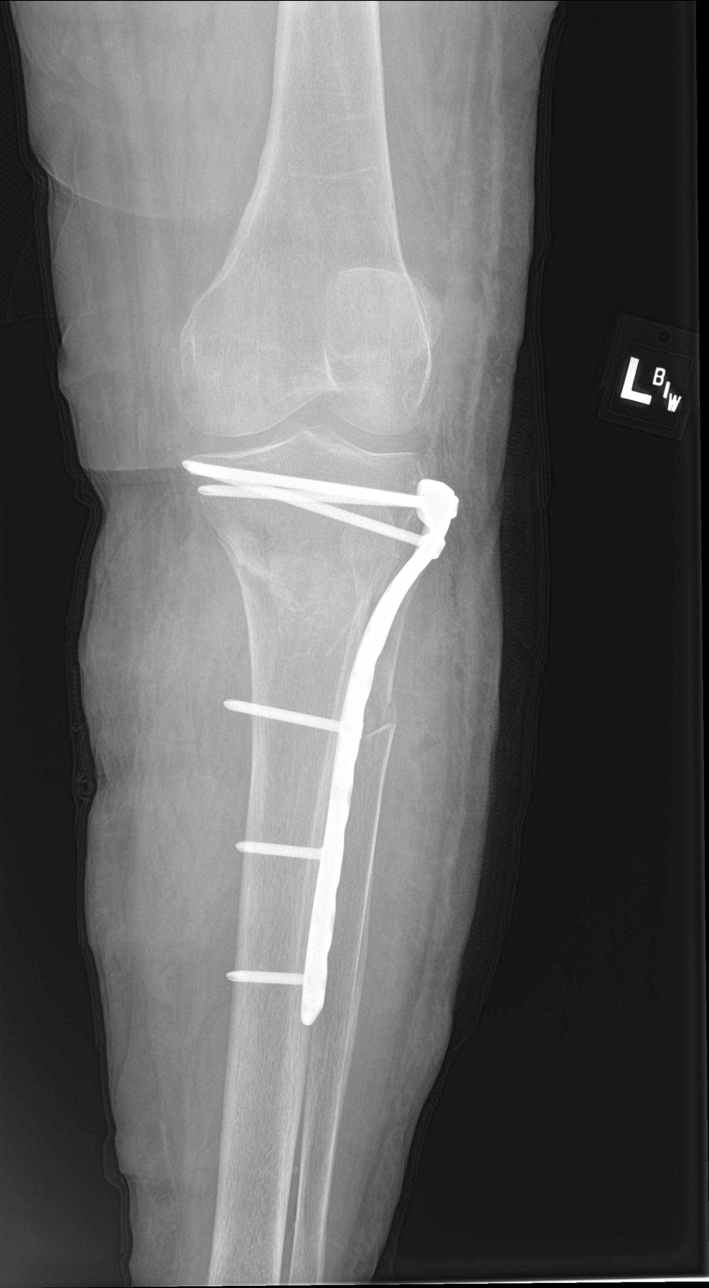

[tibia ap (2 of 2)]
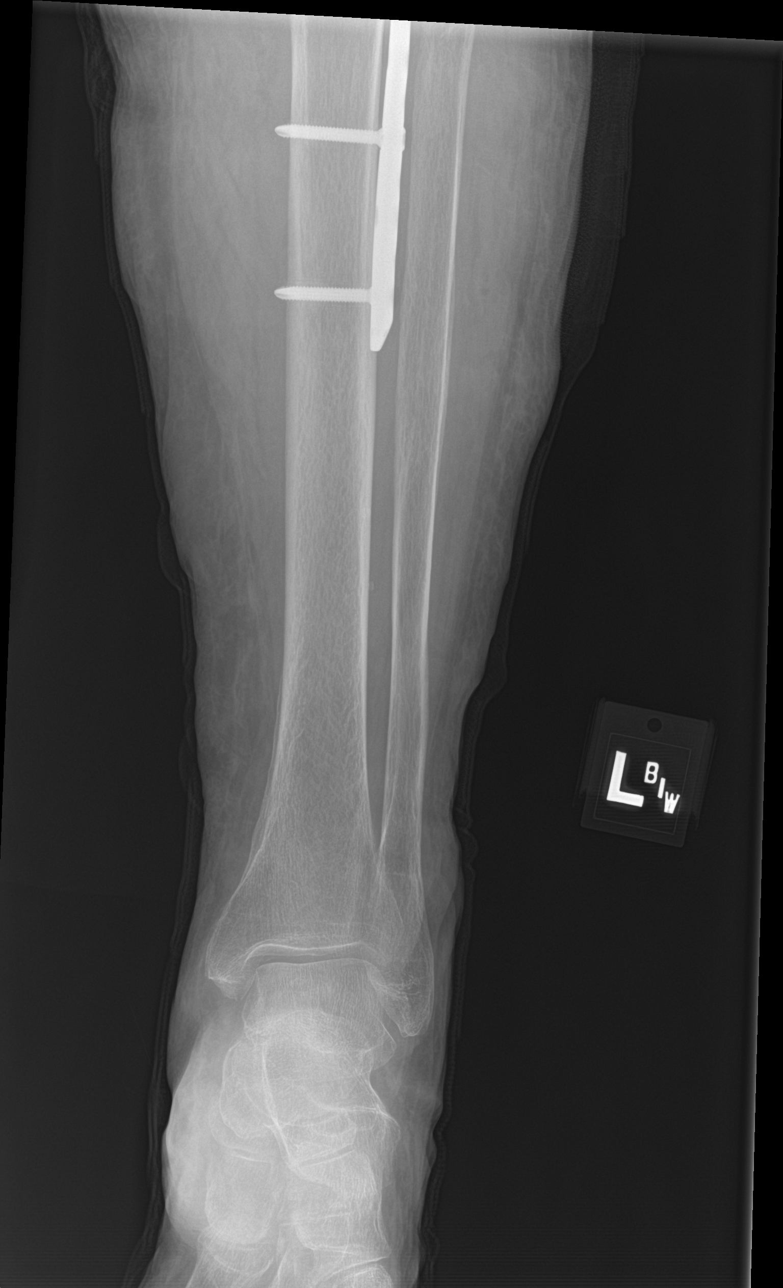

[tibia lat (1 of 2)]
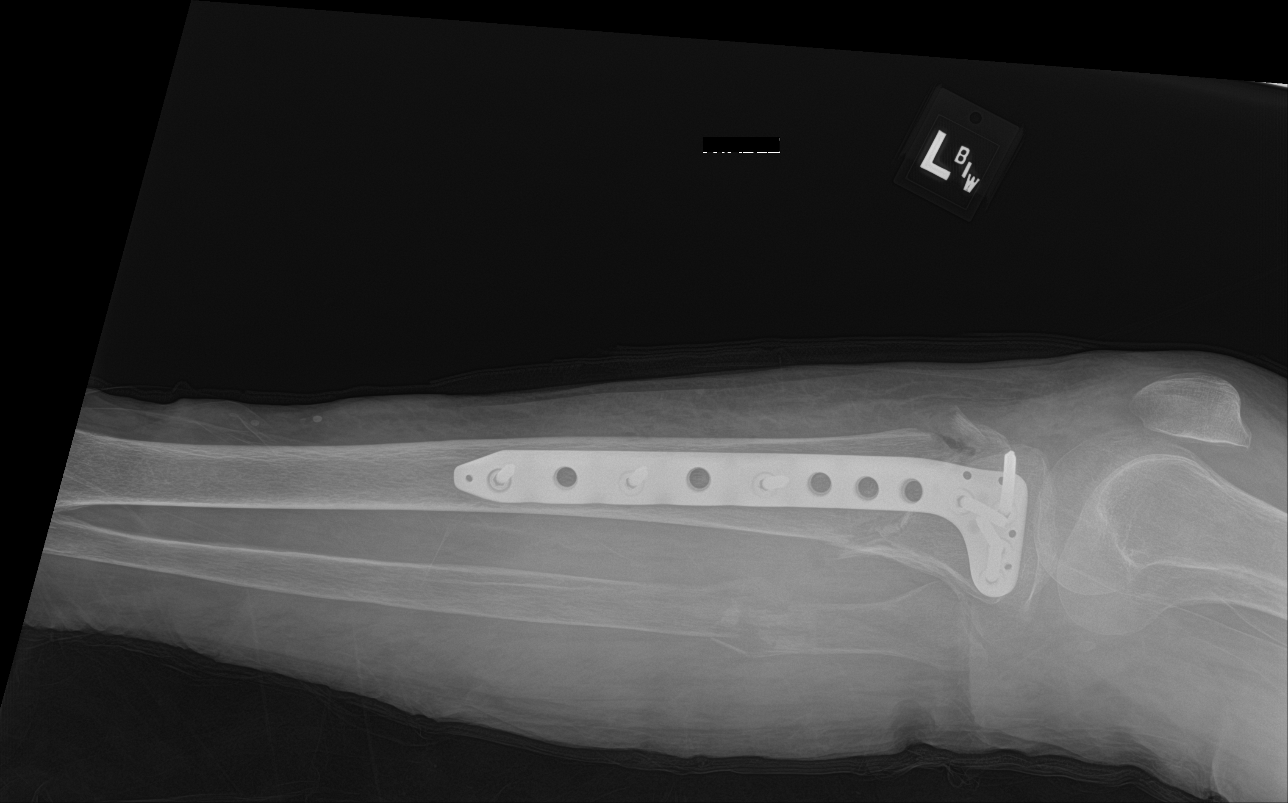

[tibia lat (2 of 2)]
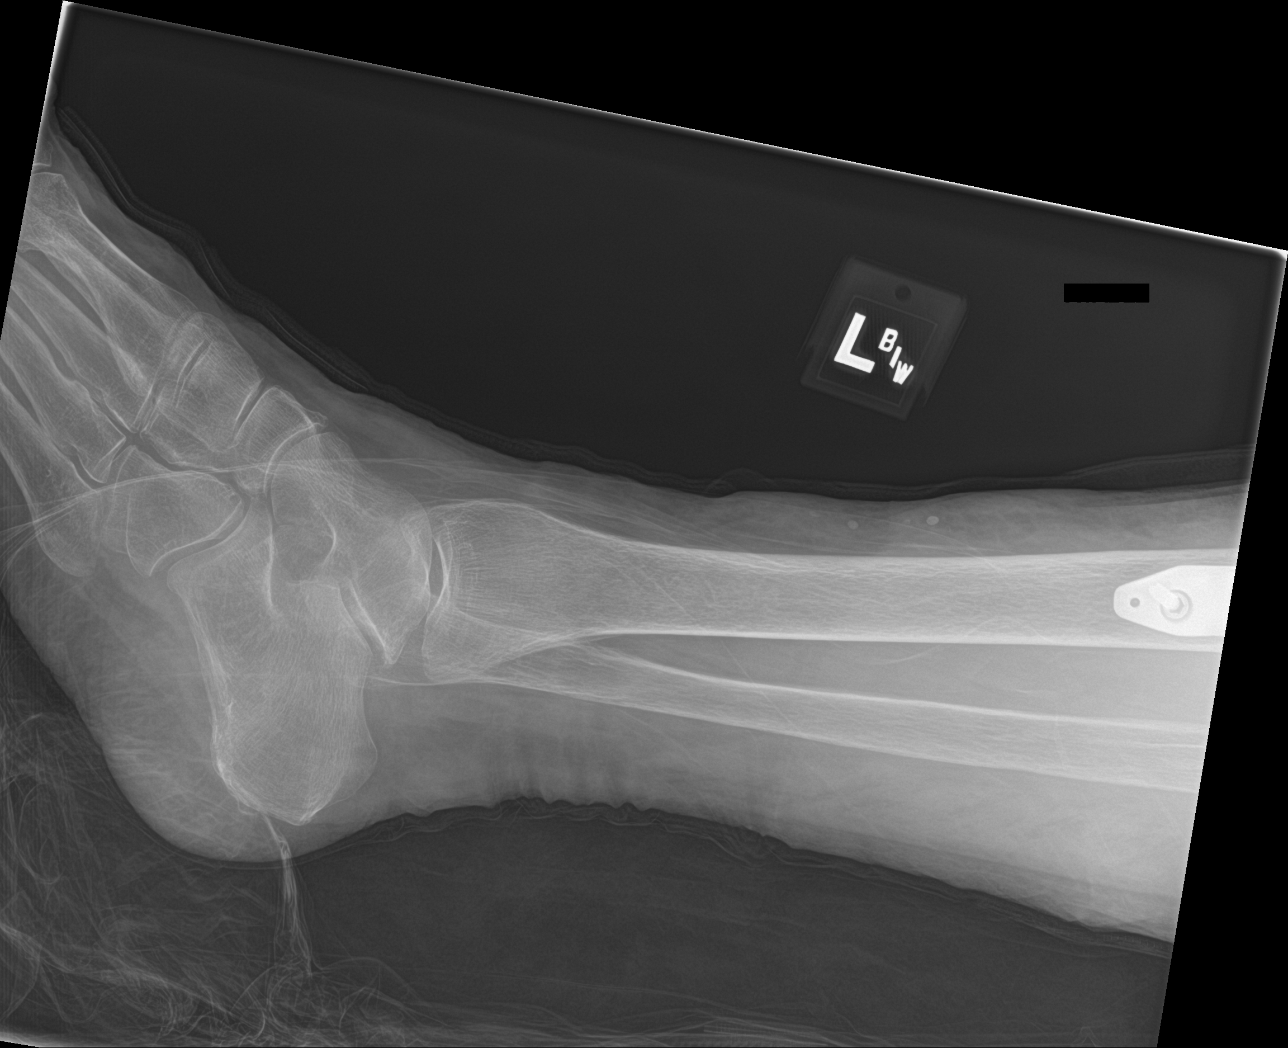

[4 of 4 positions shown; findings below may reference images not displayed]

FINDINGS: Lateral bar tests plate and multiple screws present at the proximal
LEFT tibia post ORIF of a proximal metaphyseal fracture.

Knee and ankle joint alignments normal.

Mildly displaced fracture of proximal LEFT fibular diaphysis also
identified.
IMPRESSION: Proximal LEFT fibular diaphyseal fracture, mildly displaced.

Post ORIF of a proximal LEFT tibial metaphyseal fracture.

## 2020-06-28 IMAGING — RF DG KNEE COMPLETE 4+V*L*
1 series · 3 of 3 positions shown · non-contrast
Comparison: CT 12/21/2018.

CLINICAL DATA: ORIF.

EXAM:
DG C-ARM 61-120 MIN; LEFT KNEE - COMPLETE 4+ VIEW

[Series 1: run · 3 of 3 slices shown]
[im 1/3]
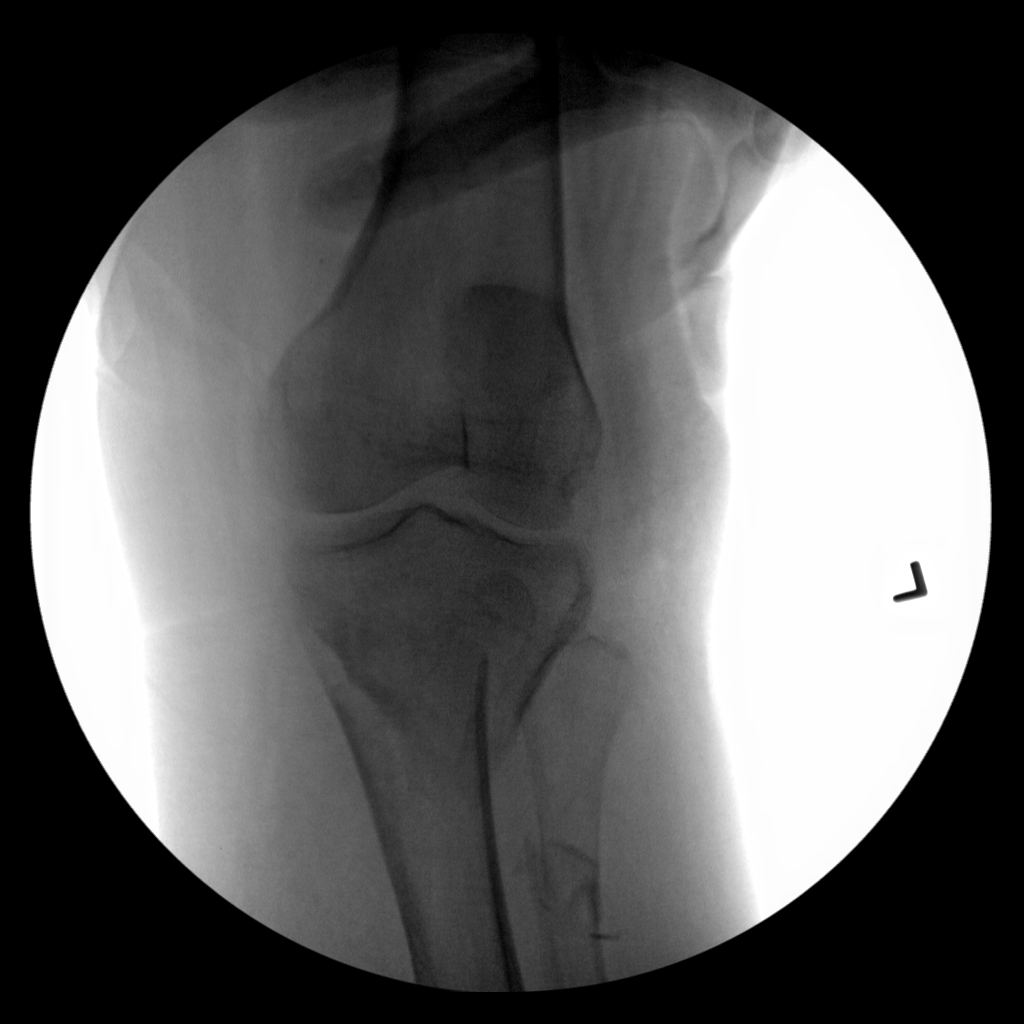
[im 2/3]
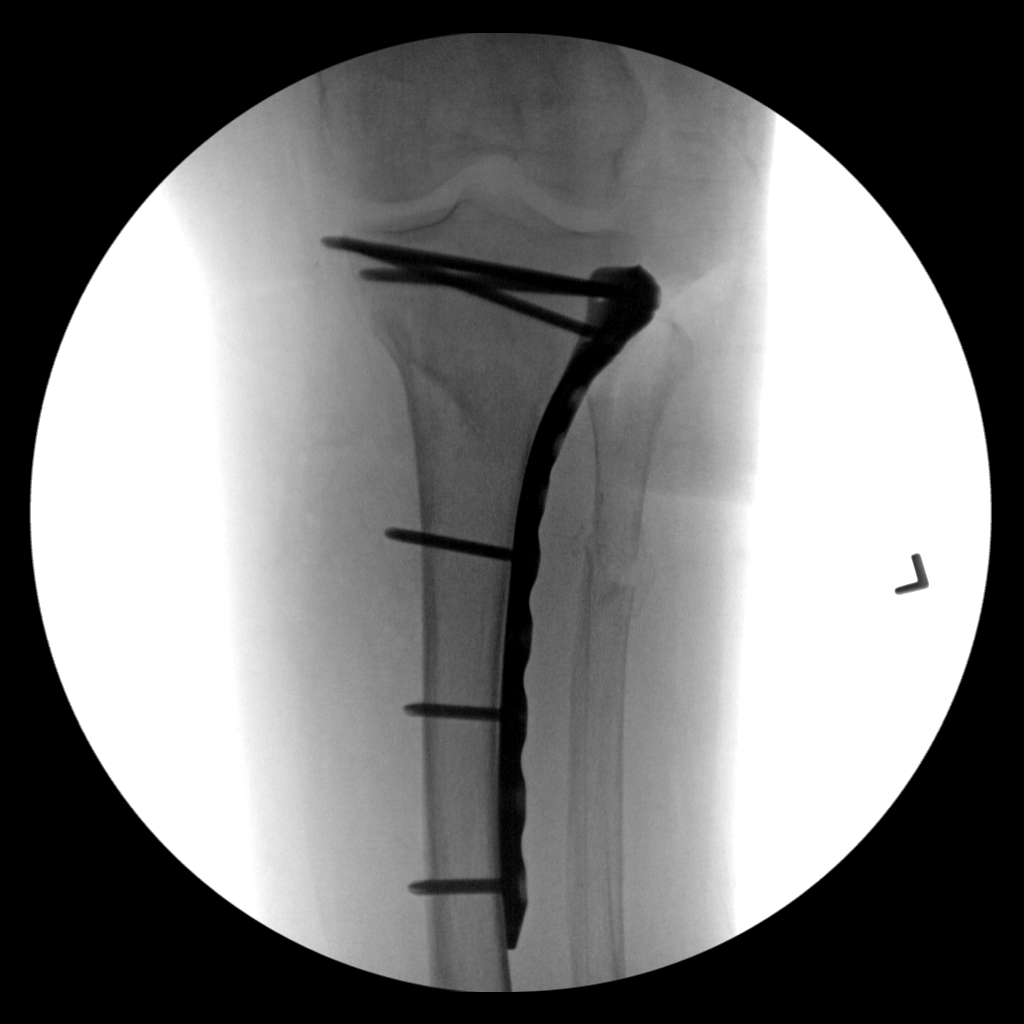
[im 3/3]
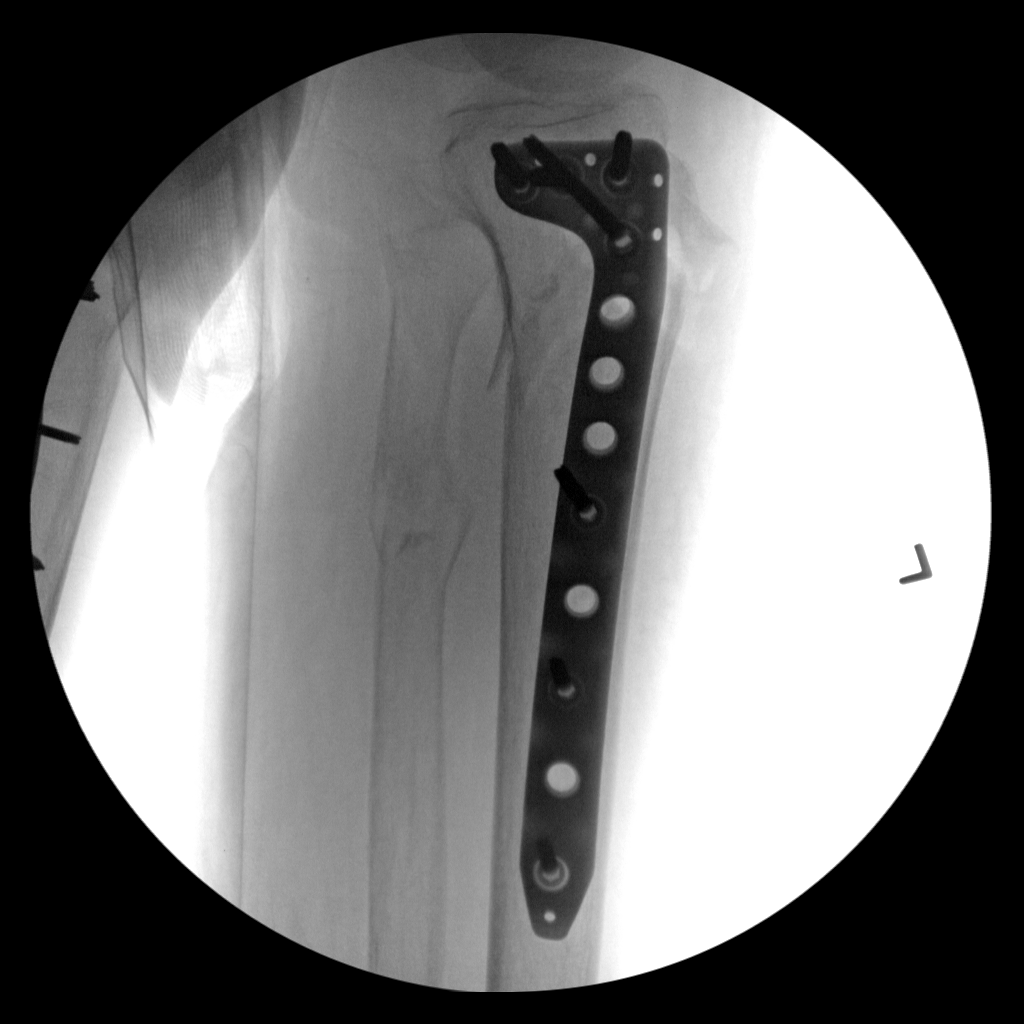

[3 of 3 positions shown; findings below may reference images not displayed]

FINDINGS: Plate and screw fixation of the proximal tibia noted. Hardware
intact. Near anatomic alignment. Proximal fibular fracture noted.
IMPRESSION: Plate screw fixation of proximal tibia. Hardware intact. Near
anatomic alignment.

## 2020-06-28 IMAGING — DX DG KNEE 1-2V PORT*R*
2 series · 2 of 2 positions shown · non-contrast
Comparison: Radiograph 12/21/2018

CLINICAL DATA: S/P bilateral tibial plateau's.

EXAM:
PORTABLE RIGHT KNEE - 1-2 VIEW

[knee ap]
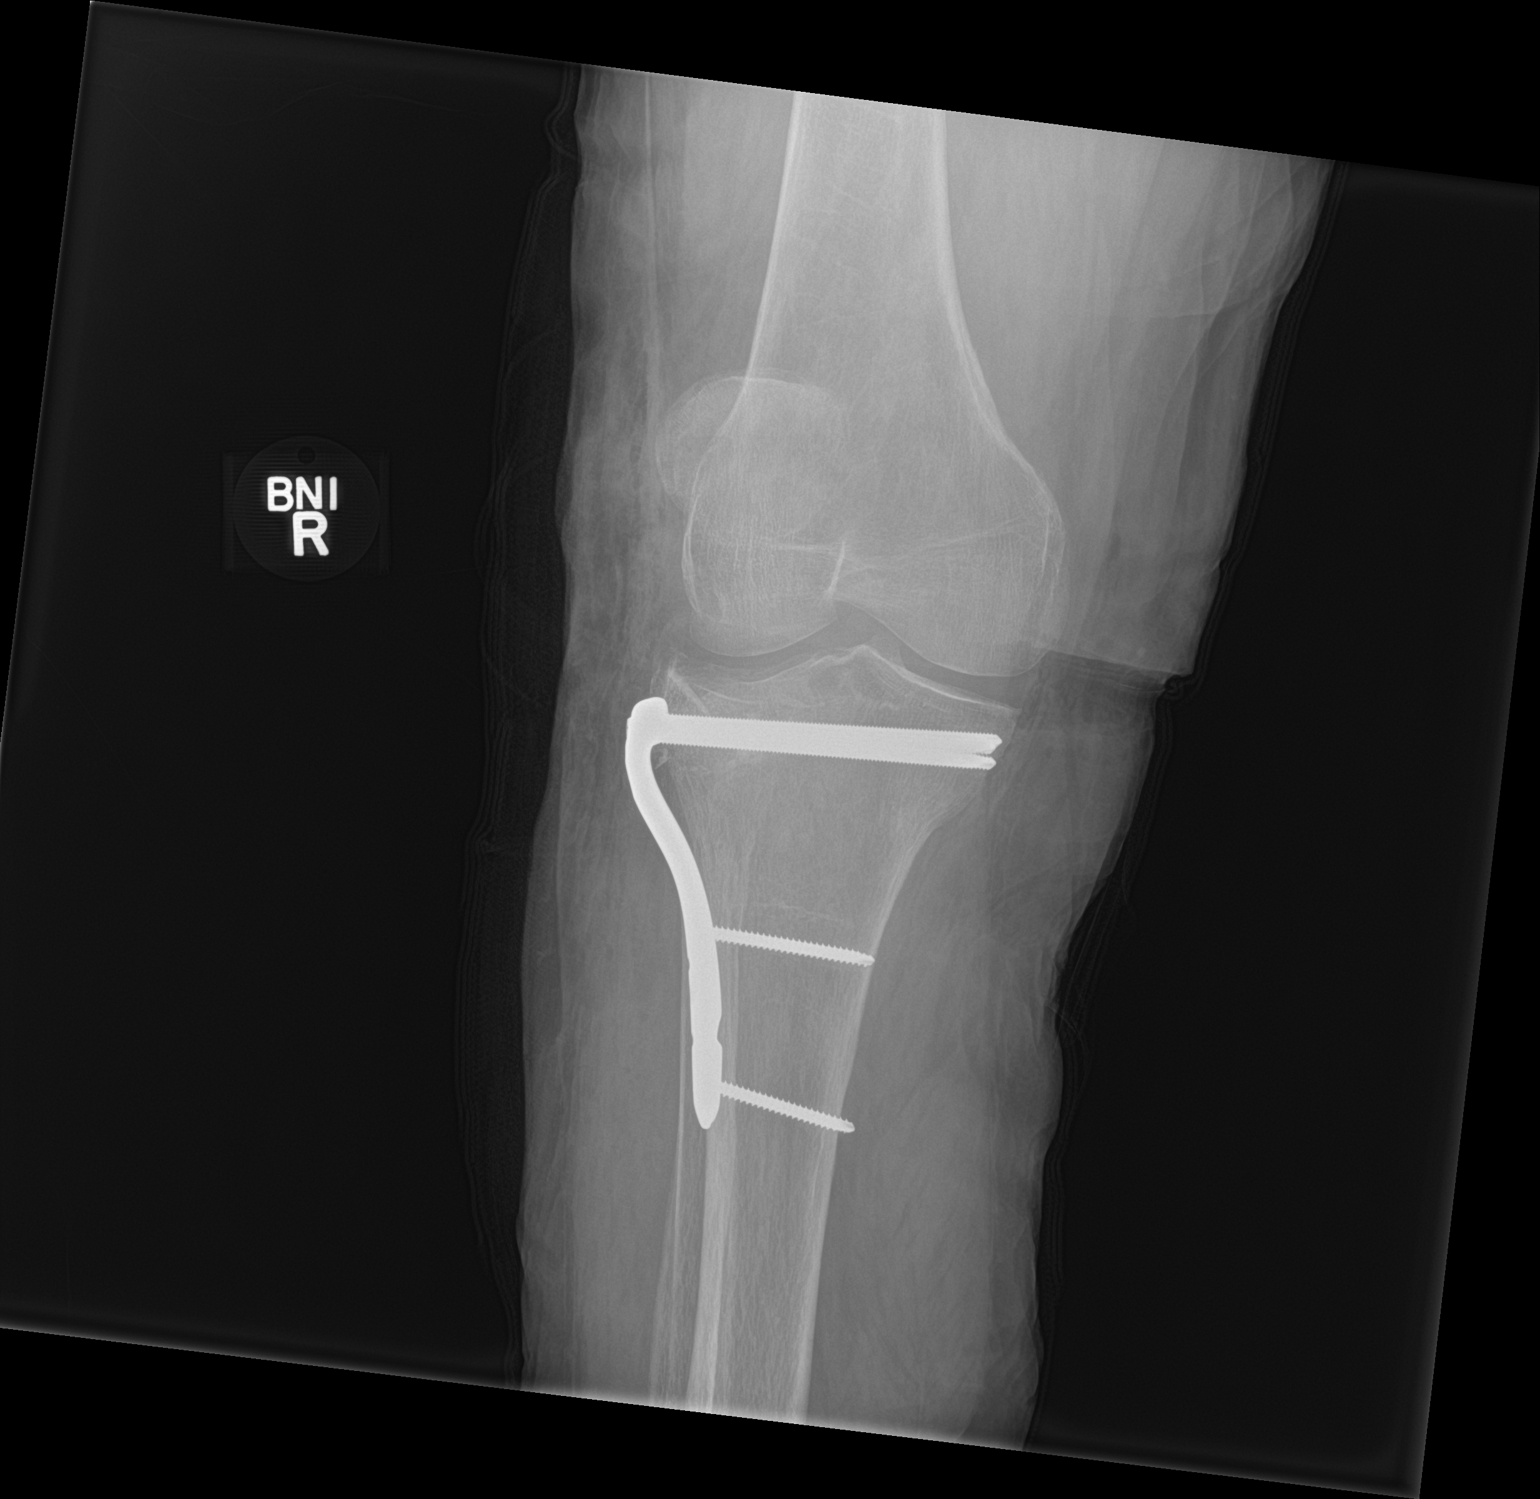

[knee lat]
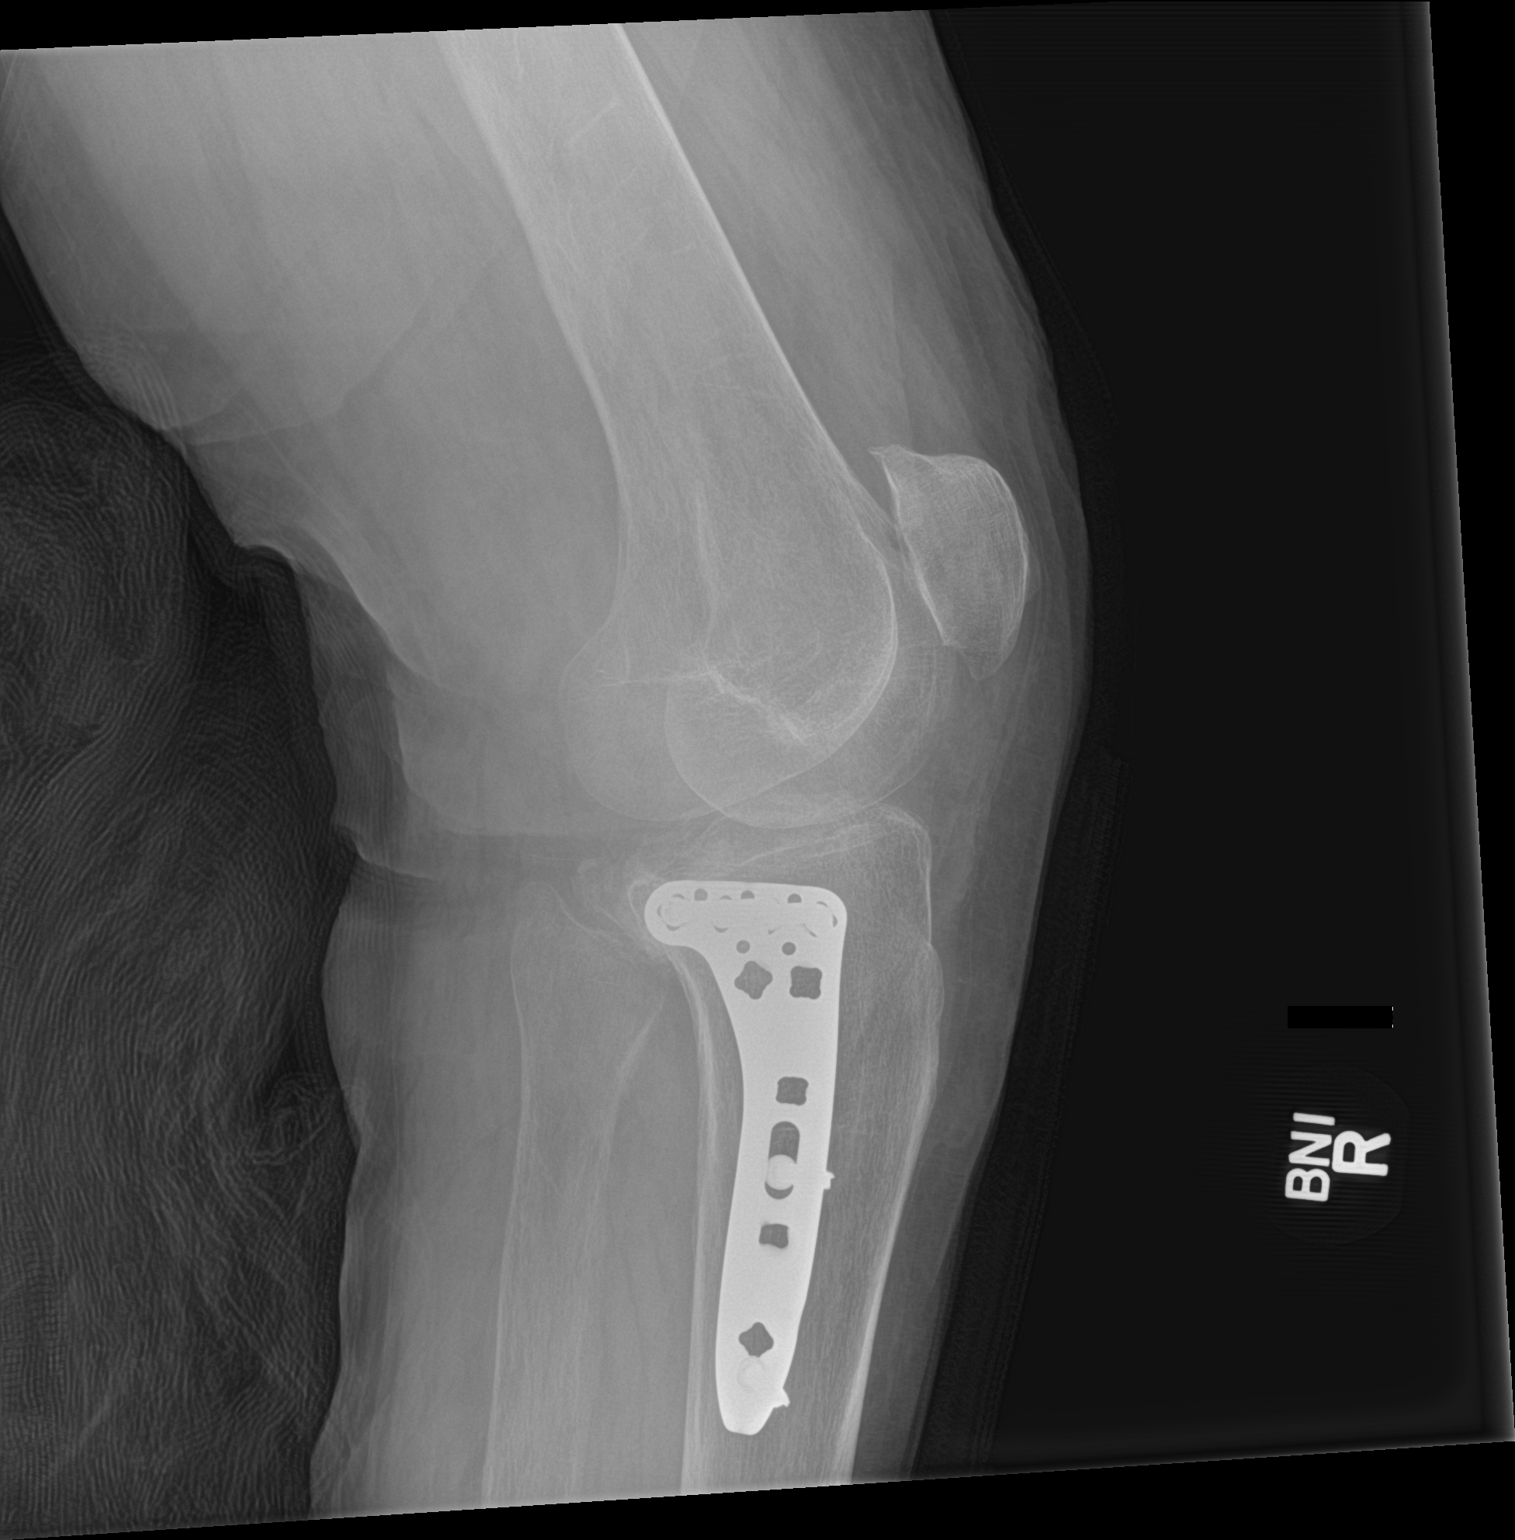

[2 of 2 positions shown; findings below may reference images not displayed]

FINDINGS: Internal plate fixation tibial plateau fracture with dynamic
fixation plate and 5 fixation cortical screws. Anatomic alignment.
No complication.
IMPRESSION: Internal fixation of tibial plateau fracture.

## 2020-06-30 IMAGING — DX DG CHEST 1V PORT
1 series · 1 of 1 positions shown · non-contrast
Comparison: 12/21/2018

CLINICAL DATA: Hypoxia.

EXAM:
PORTABLE CHEST 1 VIEW

[chest]
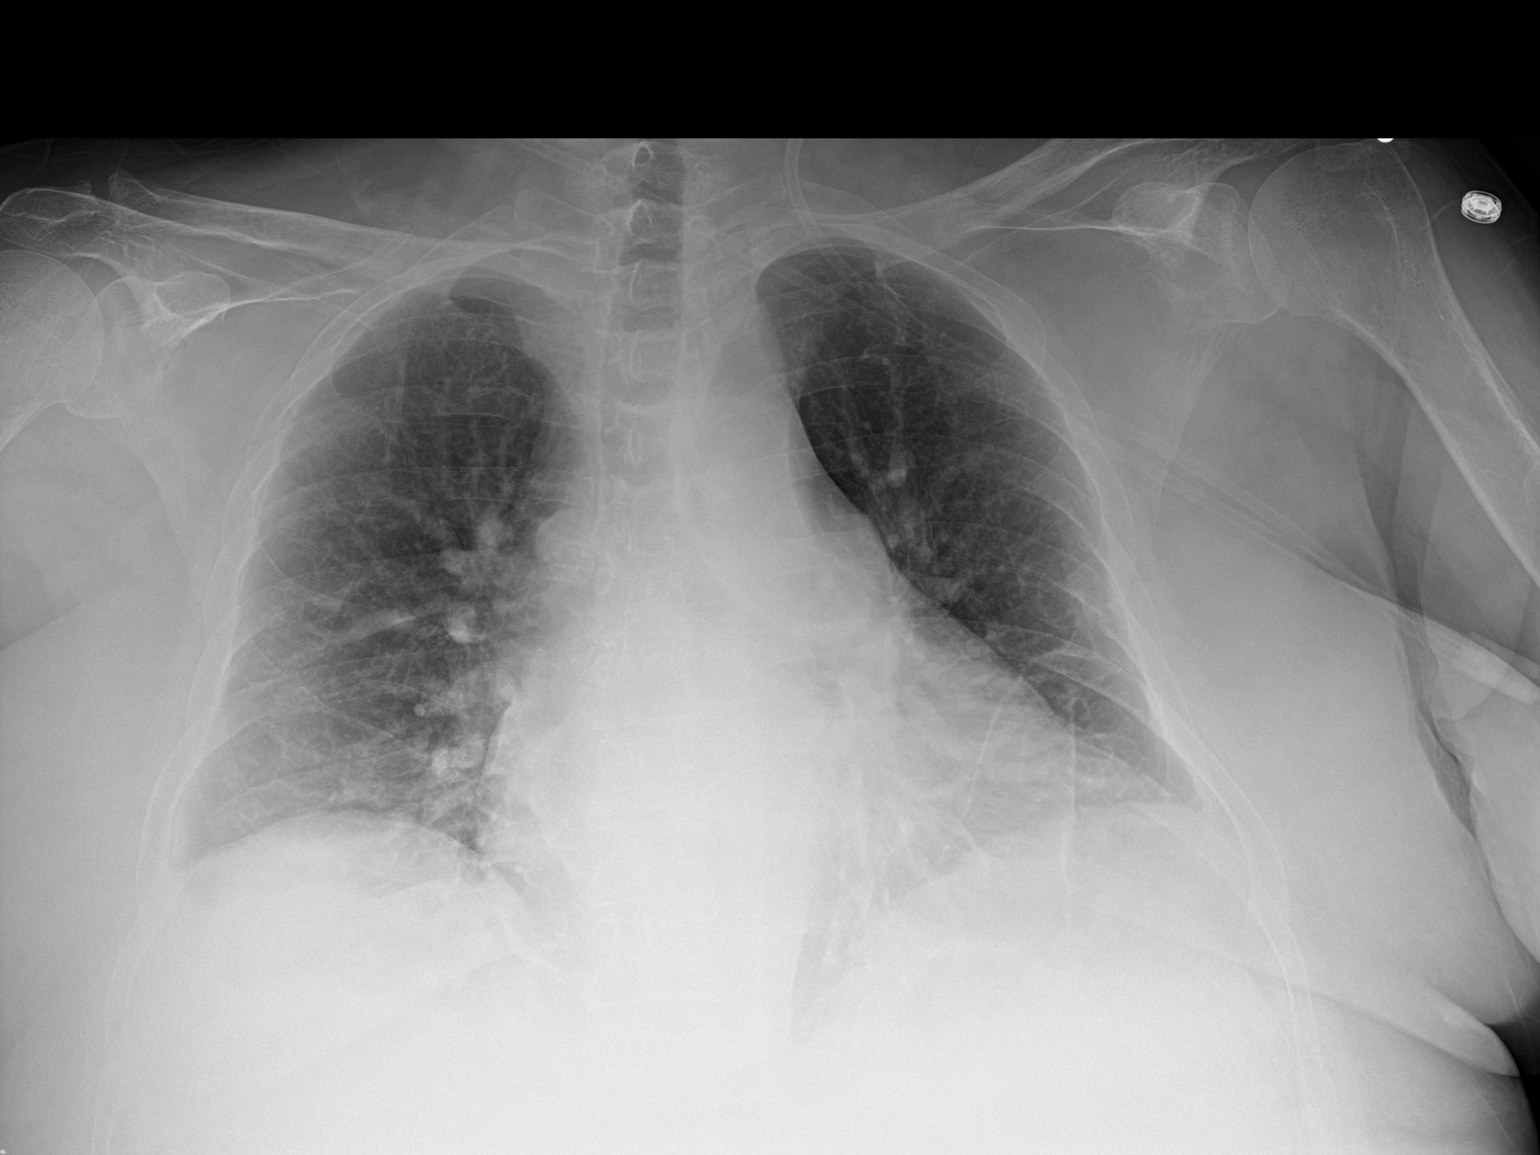

[1 of 1 positions shown; findings below may reference images not displayed]

FINDINGS: 6797 hours. The cardio pericardial silhouette is enlarged. There is
pulmonary vascular congestion without overt pulmonary edema.
Interstitial markings are diffusely coarsened with chronic features.
Scarring at the left base is stable in the interval. The visualized
bony structures of the thorax are intact.
IMPRESSION: Stable scarring left base.  No acute cardiopulmonary findings.

## 2020-07-01 ENCOUNTER — Emergency Department (HOSPITAL_COMMUNITY): Payer: Medicare Other

## 2020-07-01 ENCOUNTER — Inpatient Hospital Stay (HOSPITAL_COMMUNITY)
Admission: EM | Admit: 2020-07-01 | Discharge: 2020-07-03 | DRG: 641 | Disposition: A | Discharge status: Home Health | Payer: Medicare Other | Attending: Internal Medicine | Admitting: Internal Medicine

## 2020-07-01 ENCOUNTER — Other Ambulatory Visit: Payer: Self-pay

## 2020-07-01 DIAGNOSIS — Z8249 Family history of ischemic heart disease and other diseases of the circulatory system: Secondary | ICD-10-CM

## 2020-07-01 DIAGNOSIS — E860 Dehydration: Secondary | ICD-10-CM | POA: Diagnosis not present

## 2020-07-01 DIAGNOSIS — R5381 Other malaise: Secondary | ICD-10-CM | POA: Diagnosis present

## 2020-07-01 DIAGNOSIS — Z9981 Dependence on supplemental oxygen: Secondary | ICD-10-CM

## 2020-07-01 DIAGNOSIS — Z79899 Other long term (current) drug therapy: Secondary | ICD-10-CM

## 2020-07-01 DIAGNOSIS — D849 Immunodeficiency, unspecified: Secondary | ICD-10-CM | POA: Diagnosis present

## 2020-07-01 DIAGNOSIS — R000 Tachycardia, unspecified: Secondary | ICD-10-CM | POA: Diagnosis present

## 2020-07-01 DIAGNOSIS — Z20822 Contact with and (suspected) exposure to covid-19: Secondary | ICD-10-CM | POA: Diagnosis present

## 2020-07-01 DIAGNOSIS — J449 Chronic obstructive pulmonary disease, unspecified: Secondary | ICD-10-CM | POA: Diagnosis present

## 2020-07-01 DIAGNOSIS — Z940 Kidney transplant status: Secondary | ICD-10-CM

## 2020-07-01 DIAGNOSIS — Q613 Polycystic kidney, unspecified: Secondary | ICD-10-CM

## 2020-07-01 DIAGNOSIS — Z7952 Long term (current) use of systemic steroids: Secondary | ICD-10-CM

## 2020-07-01 DIAGNOSIS — I10 Essential (primary) hypertension: Secondary | ICD-10-CM | POA: Diagnosis present

## 2020-07-01 DIAGNOSIS — I5032 Chronic diastolic (congestive) heart failure: Secondary | ICD-10-CM | POA: Diagnosis present

## 2020-07-01 DIAGNOSIS — D649 Anemia, unspecified: Secondary | ICD-10-CM | POA: Diagnosis present

## 2020-07-01 DIAGNOSIS — K219 Gastro-esophageal reflux disease without esophagitis: Secondary | ICD-10-CM | POA: Diagnosis present

## 2020-07-01 DIAGNOSIS — E46 Unspecified protein-calorie malnutrition: Secondary | ICD-10-CM | POA: Diagnosis present

## 2020-07-01 DIAGNOSIS — R6510 Systemic inflammatory response syndrome (SIRS) of non-infectious origin without acute organ dysfunction: Secondary | ICD-10-CM | POA: Diagnosis present

## 2020-07-01 DIAGNOSIS — M109 Gout, unspecified: Secondary | ICD-10-CM | POA: Diagnosis present

## 2020-07-01 DIAGNOSIS — J9611 Chronic respiratory failure with hypoxia: Secondary | ICD-10-CM | POA: Diagnosis present

## 2020-07-01 DIAGNOSIS — R0602 Shortness of breath: Secondary | ICD-10-CM

## 2020-07-01 DIAGNOSIS — N1831 Chronic kidney disease, stage 3a: Secondary | ICD-10-CM | POA: Diagnosis present

## 2020-07-01 DIAGNOSIS — Z8271 Family history of polycystic kidney: Secondary | ICD-10-CM

## 2020-07-01 DIAGNOSIS — Z7951 Long term (current) use of inhaled steroids: Secondary | ICD-10-CM

## 2020-07-01 DIAGNOSIS — C9000 Multiple myeloma not having achieved remission: Secondary | ICD-10-CM | POA: Diagnosis present

## 2020-07-01 DIAGNOSIS — Z87891 Personal history of nicotine dependence: Secondary | ICD-10-CM

## 2020-07-01 DIAGNOSIS — D84821 Immunodeficiency due to drugs: Secondary | ICD-10-CM | POA: Diagnosis present

## 2020-07-01 DIAGNOSIS — I130 Hypertensive heart and chronic kidney disease with heart failure and stage 1 through stage 4 chronic kidney disease, or unspecified chronic kidney disease: Secondary | ICD-10-CM | POA: Diagnosis present

## 2020-07-01 DIAGNOSIS — D72825 Bandemia: Secondary | ICD-10-CM

## 2020-07-01 DIAGNOSIS — R509 Fever, unspecified: Secondary | ICD-10-CM

## 2020-07-01 MED ORDER — AEROCHAMBER Z-STAT PLUS/MEDIUM MISC
1.0000 | Freq: Once | Status: AC
  Administered 2020-07-02: 1
  Filled 2020-07-01: qty 1

## 2020-07-01 MED ORDER — ALBUTEROL SULFATE HFA 108 (90 BASE) MCG/ACT IN AERS
6.0000 | INHALATION_SPRAY | Freq: Once | RESPIRATORY_TRACT | Status: AC
  Administered 2020-07-02: 6 via RESPIRATORY_TRACT
  Filled 2020-07-01: qty 6.7

## 2020-07-01 MED ORDER — IPRATROPIUM BROMIDE HFA 17 MCG/ACT IN AERS
2.0000 | INHALATION_SPRAY | Freq: Once | RESPIRATORY_TRACT | Status: AC
  Administered 2020-07-02: 2 via RESPIRATORY_TRACT
  Filled 2020-07-01: qty 12.9

## 2020-07-01 NOTE — ED Triage Notes (Signed)
Pt presents from home for increasing SOB and weakness starting today, fever this morning which decreased with tylenol, pt denies pain, GCS 15, hx COPD and CHF and wears 3L  at baseline

## 2020-07-01 NOTE — ED Provider Notes (Signed)
[facility] Provider Note  CSN: [id number] Arrival date & time: 07/01/20 2320  Chief Complaint(s) Shortness of Breath  HPI April [name] is a 64 y.o. female with extensive past medical history listed below who presents to the emergency gradually worsening shortness of breath that began earlier this morning.  Patient noted that she had a fever of 101.4 this morning with Tylenol.  Patient noted that she has been having generalized fatigue for the past several days with myalgias.  Patient has had cough and congestion.  Cough is nonproductive.  She reports decrease oral intake due to associated nausea.  She also endorses diarrhea.  Has any chest pain.  No abdominal pain. She reports being compliant with all her medication including her lasix.  EMS was called due to severity of her shortness of breath this evening.  That noted patient was tachypneic and diaphoretic.  Patient was given 125 Solu-Medrol.  Sats on her 3 L nasal cannula 88-91.  Due to her increased work of breathing we placed her on 5 L nasal cannula.  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   History of immunosuppressive therapy 05/27/2020   FUO (fever of unknown origin) 05/15/2020   Immunocompromised state ([place])    Decubitus ulcer of coccyx, unstageable ([place]) 05/01/2020   Pericardial effusion 04/16/2020   Chronic respiratory  failure with hypoxia ([place]) 04/16/2020   Proteinuria 04/16/2020   Pressure injury of skin 04/08/2020   COPD (chronic obstructive pulmonary disease) ([place]) 04/07/2020   SVT (supraventricular tachycardia) (HCC)    Anemia of chronic disease    Palliative care encounter    Goals of care, counseling/discussion    Essential hypertension 02/08/2020   Glaucoma 02/08/2020   Fracture of left superior pubic ramus (HCC)    Pain    Anxiety state    Left displaced femoral neck fracture ([place]) 12/15/2019   Status post total hip replacement, left    Post-op pain    Polycystic kidney    Closed left hip fracture ([place]) 12/07/2019   Left wrist fracture 12/07/2019   Medication management 09/22/2019   Physical deconditioning 09/22/2019   Acute respiratory failure with hypoxia ([place]) 04/19/2019   Acute on chronic diastolic CHF (congestive heart failure) ([place]) 04/19/2019   Bilateral closed proximal tibial fracture 12/21/2018   Asthma, chronic, unspecified asthma severity, with acute exacerbation 10/03/2018   Chronic diastolic CHF (congestive heart failure) ([place]) 10/01/2017   Asthma, mild intermittent 07/15/2016   GERD without esophagitis 07/15/2016   Renal transplant recipient 07/15/2016   Hyperlipidemia 07/15/2016   Depression 07/15/2016   Bronchitis, mucopurulent recurrent ([place]) 08/08/2014   Chronic cough 07/24/2014   End stage renal disease ([place]) 23/55/7322   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, implant, and graft 03/23/2012   Home Medication(s) Prior to Admission medications   Medication Sig Start Date End Date Taking? Authorizing Provider  acetaminophen (TYLENOL)  325 MG tablet Take 2 tablets (650 mg total) by mouth every 6 (six) hours as needed for mild pain (or Fever >/= 101). 05/22/20   Elgergawy, Silver Huguenin, MD  albuterol (VENTOLIN HFA) 108 (90 Base) MCG/ACT inhaler Inhale 2 puffs into the lungs every 4 (four) hours as needed for wheezing or shortness  of breath. 01/04/20   Angiulli, Lavon Paganini, PA-C  allopurinol (ZYLOPRIM) 300 MG tablet Take 1 tablet (300 mg total) by mouth daily. 01/04/20   Angiulli, Lavon Paganini, PA-C  ascorbic acid (VITAMIN C) 500 MG tablet Take 500 mg by mouth daily.    [provider]  aspirin 81 MG chewable tablet Chew 81 mg by mouth daily.    [provider]  brimonidine (ALPHAGAN P) 0.1 % SOLN Place 1 drop into both eyes 2 (two) times daily.     [provider]  budesonide (PULMICORT) 1 MG/2ML nebulizer solution Take 1 mg by nebulization in the morning and at bedtime.    [provider]  cholecalciferol (VITAMIN D3) 25 MCG (1000 UNIT) tablet Take 1 tablet (1,000 Units total) by mouth daily. 01/04/20   Angiulli, Lavon Paganini, PA-C  collagenase (SANTYL) ointment Apply 1 application topically daily. Sacral wound    [provider]  cyanocobalamin 1000 MCG tablet Take 1,000 mcg by mouth daily.    [provider]  cycloSPORINE (RESTASIS) 0.05 % ophthalmic emulsion Place 1 drop into both eyes 2 (two) times daily. 01/04/20   Angiulli, Lavon Paganini, PA-C  dorzolamide (TRUSOPT) 2 % ophthalmic solution Place 1 drop into both eyes daily. 06/25/19   [provider]  ferrous sulfate 325 (65 FE) MG tablet Take 325 mg by mouth daily with breakfast.    [provider]  FLUoxetine (PROZAC) 40 MG capsule Take 1 capsule (40 mg total) by mouth daily. 01/04/20   Angiulli, Lavon Paganini, PA-C  furosemide (LASIX) 40 MG tablet Take 1 tablet (40 mg total) by mouth daily. 02/07/20   Raiford Noble Latif, DO  gabapentin (NEURONTIN) 100 MG capsule Take 200 mg by mouth 3 (three) times daily.    [provider]  guaiFENesin (MUCINEX) 600 MG 12 hr tablet Take 1 tablet (600 mg total) by mouth 2 (two) times daily. 02/06/20   Sheikh, Georgina Quint Latif, DO  insulin aspart (NOVOLOG) 100 UNIT/ML injection Inject 2-12 Units into the skin See admin instructions. Per sliding scale subcutaneous, once a day - If blood  sugar is: 0 - 200: 0 units   201 - 250: 2 units   251 - 300: 4 units   301 - 350: 6 units   351 - 400: 8 units   401 - 450: 10 units   451 - 500: 12 units   500 or greater: Call MD and give 14 units, then recheck blood sugar in 2 hours, at that time if blood sugar is greater than 400 or less than 80, call PEC TRIAGE. Patient not taking: Reported on 06/19/2020    [provider]  ipratropium (ATROVENT HFA) 17 MCG/ACT inhaler Inhale 1 puff into the lungs every 6 (six) hours. For COPD    [provider]  isosorbide mononitrate (IMDUR) 60 MG 24 hr tablet Take 1 tablet (60 mg total) by mouth daily. 05/23/20   Elgergawy, Silver Huguenin, MD  levalbuterol Penne Lash) 0.63 MG/3ML nebulizer solution Take 0.63 mg by nebulization See admin instructions. Inhale 0.63mg  once daily.  May take 0.63mg  every six hours as needed COPD.    [provider]  magnesium oxide (MAG-OX) 400 MG tablet Take 1 tablet (400 mg total) by mouth 2 (two) times daily. 01/04/20   Angiulli, Lavon Paganini, PA-C  Multiple Vitamin (MULTIVITAMIN WITH MINERALS) TABS tablet Take 1 tablet by mouth daily. 02/24/20   Alma Friendly, MD  mycophenolate (MYFORTIC) 180 MG EC tablet Take 180 mg by mouth 2 (two) times daily.    [provider]  pantoprazole (PROTONIX) 40 MG tablet Take 1 tablet (40 mg total) by mouth 2 (two) times daily. 01/04/20   Angiulli, Lavon Paganini, PA-C  predniSONE (DELTASONE) 5 MG tablet Take 20 mg for 3 days then 10 mg for 3 days then resume chronic home dose of 7.5 mg daily. 06/06/20   Regalado, Belkys A, MD  PROGRAF 1 MG capsule Take 3 mg by mouth daily.  03/22/19   [provider]  sennosides-docusate sodium (SENOKOT-S) 8.6-50 MG tablet Take 2 tablets by mouth daily.     [provider]  tacrolimus (PROGRAF) 0.5 MG capsule Take 0.5 mg by mouth at bedtime. Takes brand name Prograf    [provider]  TRAVATAN Z 0.004 % SOLN ophthalmic solution Place 1 drop into both eyes at bedtime.  07/03/16   [provider]  valACYclovir (VALTREX) 500 MG tablet Take 500 mg by mouth 2 (two) times daily.    [provider]  verapamil (CALAN-SR) 240 MG CR tablet Take 240 mg by mouth daily.    [provider]                                                                                                                                    Past Surgical History Past Surgical History:  Procedure Laterality Date   AV FISTULA PLACEMENT  10-21-2010   left Brachiocephalic AVF   BILATERAL OOPHORECTOMY     BIOPSY  05/19/2020   Procedure: BIOPSY;  Surgeon: Carol Ada, MD;  Location: [facility];  Service: Endoscopy;;   CARPAL TUNNEL RELEASE  2000   CESAREAN SECTION     COLONOSCOPY WITH PROPOFOL N/A 05/19/2020   Procedure: COLONOSCOPY WITH PROPOFOL;  Surgeon: Carol Ada, MD;  Location: [facility];  Service: Endoscopy;  Laterality: N/A;   ENTEROSCOPY N/A 05/19/2020   Procedure: ENTEROSCOPY;  Surgeon: Carol Ada, MD;  Location: [facility];  Service: Endoscopy;  Laterality: N/A;   INSERTION OF DIALYSIS CATHETER  03/31/2012   Procedure: INSERTION OF DIALYSIS CATHETER;  Surgeon: Angelia Mould, MD;  Location: [facility];  Service: Vascular;  Laterality: N/A;  insertion of dialysis catheter right internal jugular vein   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   ORIF TIBIA FRACTURE Left 12/23/2018   Procedure: OPEN REDUCTION INTERNAL FIXATION (ORIF) TIBIA FRACTURE;  Surgeon: Shona Needles, MD;  Location: [facility];  Service: Orthopedics;  Laterality: Left;   ORIF TIBIA PLATEAU Right 12/23/2018   Procedure: OPEN REDUCTION INTERNAL FIXATION (ORIF) TIBIAL PLATEAU;  Surgeon: Shona Needles,  MD;  Location: [facility];  Service: Orthopedics;  Laterality: Right;   ORIF WRIST FRACTURE Left 12/08/2019   Procedure: OPEN REDUCTION INTERNAL FIXATION (ORIF) WRIST FRACTURE, REPAIR LACERATION LEFT FOREARM;  Surgeon: Dorna Leitz, MD;  Location: [facility];  Service: Orthopedics;   Laterality: Left;   TONSILLECTOMY  1967   TOTAL HIP ARTHROPLASTY Left 12/08/2019   Procedure: TOTAL HIP ARTHROPLASTY ANTERIOR APPROACH;  Surgeon: Dorna Leitz, MD;  Location: [facility];  Service: Orthopedics;  Laterality: Left;   TUBAL LIGATION  2010   Family History Family History  Problem Relation Age of Onset   Hypertension Mother    Heart disease Father        CABG history   Polycystic kidney disease Father    Polycystic kidney disease Brother     Social History Social History   Tobacco Use   Smoking status: Former Smoker    Packs/day: 0.25    Years: 15.00    Pack years: 3.75    Types: Cigarettes    Quit date: 03/22/1998    Years since quitting: 22.2   Smokeless tobacco: Never Used  Vaping Use   Vaping Use: Never used  Substance Use Topics   Alcohol use: No    Comment: social   Drug use: No   Allergies Infed [iron dextran], Pentamidine, Erythromycin [erythromycin], Iohexol, Oxycodone, Erythromycin, and Ultram [tramadol hcl]  Review of Systems Review of Systems All other systems are reviewed and are negative for acute change except as noted in the HPI  Physical Exam Vital Signs  I have reviewed the triage vital signs BP (!) 172/87 (BP Location: Right Arm)    Pulse (!) 110    Temp 98.6 F (37 C) (Oral)    Resp (!) 30    Ht 5\' 1"  (1.549 m)    Wt 72 kg    SpO2 100%    BMI 29.99 kg/m   Physical Exam Vitals reviewed.  Constitutional:      General: She is not in acute distress.    Appearance: She is well-developed. She is not diaphoretic.  HENT:     Head: Normocephalic and atraumatic.     Nose: Nose normal.  Eyes:     General: No scleral icterus.       Right eye: No discharge.        Left eye: No discharge.     Conjunctiva/sclera: Conjunctivae normal.     Pupils: Pupils are equal, round, and reactive to light.  Cardiovascular:     Rate and Rhythm: Normal rate and regular rhythm.     Heart sounds: No murmur heard.  No friction rub. No gallop.    Pulmonary:     Effort: Tachypnea, accessory muscle usage and prolonged expiration present.     Breath sounds: Decreased air movement present. No stridor. Wheezing (faint insp) present. No rales.  Abdominal:     General: There is no distension.     Palpations: Abdomen is soft.     Tenderness: There is no abdominal tenderness.  Musculoskeletal:        General: No tenderness.     Cervical back: Normal range of motion and neck supple.  Skin:    General: Skin is warm and dry.     Findings: No erythema or rash.  Neurological:     Mental Status: She is alert and oriented to person, place, and time.     ED Results and Treatments Labs (all labs ordered are listed, but only abnormal results are displayed)  Labs Reviewed  BLOOD GAS, ARTERIAL - Abnormal; Notable for the following components:      Result Value   pO2, Arterial 112 (*)    All other components within normal limits  CBC WITH DIFFERENTIAL/PLATELET - Abnormal; Notable for the following components:   WBC 21.1 (*)    RBC 3.12 (*)    Hemoglobin 9.7 (*)    HCT 32.1 (*)    MCV 102.9 (*)    RDW 19.9 (*)    Neutro Abs 19.1 (*)    Abs Immature Granulocytes 0.15 (*)    All other components within normal limits  COMPREHENSIVE METABOLIC PANEL - Abnormal; Notable for the following components:   Glucose, Bld 135 (*)    BUN 28 (*)    Creatinine, Ser 1.02 (*)    Albumin 3.1 (*)    GFR calc non Af Amer 58 (*)    All other components within normal limits  BRAIN NATRIURETIC PEPTIDE - Abnormal; Notable for the following components:   B Natriuretic Peptide 557.8 (*)    All other components within normal limits  URINALYSIS, ROUTINE W REFLEX MICROSCOPIC - Abnormal; Notable for the following components:   Hgb urine dipstick SMALL (*)    Ketones, ur 5 (*)    Protein, ur 100 (*)    Bacteria, UA RARE (*)    All other components within normal limits  I-STAT CHEM 8, ED - Abnormal; Notable for the following components:   BUN 27 (*)     Glucose, Bld 132 (*)    Hemoglobin 10.5 (*)    HCT 31.0 (*)    All other components within normal limits  SARS CORONAVIRUS 2 BY RT PCR (HOSPITAL ORDER, [facility])  CULTURE, BLOOD (ROUTINE X 2)  CULTURE, BLOOD (ROUTINE X 2)  URINE CULTURE  EXPECTORATED SPUTUM ASSESSMENT W REFEX TO RESP CULTURE  LACTIC ACID, PLASMA  LACTIC ACID, PLASMA  PROTIME-INR  APTT  POC SARS CORONAVIRUS 2 AG -  ED                                                                                                                         EKG  EKG Interpretation  Date/Time:  Tuesday July 02 2020 00:15:38 EDT Ventricular Rate:  114 PR Interval:    QRS Duration: 124 QT Interval:  329 QTC Calculation: 453 R Axis:   114 Text Interpretation: Sinus tachycardia Nonspecific intraventricular conduction delay Minimal ST depression NO STEMI. Confirmed by Addison Lank [phone]) on 07/02/2020 12:23:38 AM      Radiology DG Chest Port 1 View  Result Date: 07/01/2020 CLINICAL DATA:  Worsening shortness of breath. EXAM: PORTABLE CHEST 1 VIEW COMPARISON:  June 06, 2019 FINDINGS: The lung volumes are low. There is persistent blunting of the costophrenic angles bilaterally. The heart size is stable but mildly enlarged. There is vascular congestion. Stable bibasilar airspace disease is noted and favored to represent atelectasis. There is no pneumothorax. There is no acute  osseous abnormality. IMPRESSION: 1. Persistent small bilateral pleural effusions with bibasilar airspace disease, favored to represent atelectasis. 2. Stable cardiac enlargement with vascular congestion. Electronically Signed   By: Constance Holster M.D.   On: 07/01/2020 23:58    Pertinent labs & imaging results that were available during my care of the patient were reviewed by me and considered in my medical decision making (see chart for details).  Medications Ordered in ED Medications  enoxaparin (LOVENOX) injection 40 mg (has no  administration in time range)  sodium chloride flush (NS) 0.9 % injection 3 mL (has no administration in time range)  sodium chloride flush (NS) 0.9 % injection 3 mL (has no administration in time range)  sodium chloride flush (NS) 0.9 % injection 3 mL (has no administration in time range)  0.9 %  sodium chloride infusion (has no administration in time range)  acetaminophen (TYLENOL) tablet 650 mg (has no administration in time range)    Or  acetaminophen (TYLENOL) suppository 650 mg (has no administration in time range)  polyethylene glycol (MIRALAX / GLYCOLAX) packet 17 g (has no administration in time range)  ondansetron (ZOFRAN) tablet 4 mg (has no administration in time range)    Or  ondansetron (ZOFRAN) injection 4 mg (has no administration in time range)  metroNIDAZOLE (FLAGYL) IVPB 500 mg (has no administration in time range)  tacrolimus (PROGRAF) capsule 3 mg (has no administration in time range)  tacrolimus (PROGRAF) capsule 0.5 mg (has no administration in time range)  mycophenolate (MYFORTIC) EC tablet 180 mg (has no administration in time range)  predniSONE (DELTASONE) tablet 7.5 mg (has no administration in time range)  ipratropium (ATROVENT HFA) inhaler 2 puff (2 puffs Inhalation Not Given 07/02/20 0729)  budesonide (PULMICORT) nebulizer solution 0.25 mg (0.25 mg Nebulization Not Given 07/02/20 0729)  albuterol (VENTOLIN HFA) 108 (90 Base) MCG/ACT inhaler 2 puff (has no administration in time range)  ceFEPIme (MAXIPIME) 2 g in sodium chloride 0.9 % 100 mL IVPB (has no administration in time range)  vancomycin (VANCOCIN) IVPB 1000 mg/200 mL premix (has no administration in time range)  albuterol (VENTOLIN HFA) 108 (90 Base) MCG/ACT inhaler 6 puff (6 puffs Inhalation Given 07/02/20 0021)  ipratropium (ATROVENT HFA) inhaler 2 puff (2 puffs Inhalation Given 07/02/20 0040)  aerochamber Z-Stat Plus/medium 1 each (1 each Other Given 07/02/20 0021)  ceFEPIme (MAXIPIME) 2 g in sodium  chloride 0.9 % 100 mL IVPB (0 g Intravenous Stopped 07/02/20 0220)  metroNIDAZOLE (FLAGYL) IVPB 500 mg (0 mg Intravenous Stopped 07/02/20 0305)  acetaminophen (TYLENOL) tablet 650 mg (650 mg Oral Given 07/02/20 0201)  vancomycin (VANCOREADY) IVPB 1500 mg/300 mL (0 mg Intravenous Stopped 07/02/20 0408)                                                                                                                                    Procedures Procedures  (including critical care time)  Medical Decision  Making / ED Course I have reviewed the nursing notes for this encounter and the patient's prior records (if available in EHR or on provided paperwork).   April [name] was evaluated in Emergency Department on 07/02/2020 for the symptoms described in the history of present illness. She was evaluated in the context of the global COVID-19 pandemic, which necessitated consideration that the patient might be at risk for infection with the SARS-CoV-2 virus that causes COVID-19. Institutional protocols and algorithms that pertain to the evaluation of patients at risk for COVID-19 are in a state of rapid change based on information released by regulatory bodies including the CDC and federal and state organizations. These policies and algorithms were followed during the patient's care in the ED.  Patient presents with worsening shortness of breath and fever. Patient had recent admission for COPD exacerbation.  Chest x-ray with bibasilar pleural effusions with opacities.  Possibly atelectasis versus pneumonia.  Patient has leukocytosis. Code sepsis was initiated and patient was started on empiric antibiotics.  Breathing improved with inhaler puffs.  UA without evidence of infection. Covid test negative.   Admitted to medicine for further work-up and management.      Final Clinical Impression(s) / ED Diagnoses Final diagnoses:  SOB (shortness of breath)  Bandemia  Fever, unspecified       This chart was dictated using voice recognition software.  Despite best efforts to proofread,  errors can occur which can change the documentation meaning.   Fatima Blank, MD 07/02/20 [id number]

## 2020-07-02 ENCOUNTER — Ambulatory Visit (HOSPITAL_COMMUNITY): Admission: RE | Admit: 2020-07-02 | Payer: Medicare Other | Source: Ambulatory Visit

## 2020-07-02 ENCOUNTER — Encounter (HOSPITAL_COMMUNITY): Payer: Self-pay | Admitting: Family Medicine

## 2020-07-02 ENCOUNTER — Ambulatory Visit (HOSPITAL_COMMUNITY): Payer: Medicare Other

## 2020-07-02 DIAGNOSIS — Z7952 Long term (current) use of systemic steroids: Secondary | ICD-10-CM | POA: Diagnosis not present

## 2020-07-02 DIAGNOSIS — N1831 Chronic kidney disease, stage 3a: Secondary | ICD-10-CM | POA: Diagnosis present

## 2020-07-02 DIAGNOSIS — R0602 Shortness of breath: Secondary | ICD-10-CM | POA: Diagnosis present

## 2020-07-02 DIAGNOSIS — Q613 Polycystic kidney, unspecified: Secondary | ICD-10-CM | POA: Diagnosis not present

## 2020-07-02 DIAGNOSIS — C9000 Multiple myeloma not having achieved remission: Secondary | ICD-10-CM | POA: Diagnosis present

## 2020-07-02 DIAGNOSIS — J449 Chronic obstructive pulmonary disease, unspecified: Secondary | ICD-10-CM

## 2020-07-02 DIAGNOSIS — Z9981 Dependence on supplemental oxygen: Secondary | ICD-10-CM | POA: Diagnosis not present

## 2020-07-02 DIAGNOSIS — Z8249 Family history of ischemic heart disease and other diseases of the circulatory system: Secondary | ICD-10-CM | POA: Diagnosis not present

## 2020-07-02 DIAGNOSIS — E46 Unspecified protein-calorie malnutrition: Secondary | ICD-10-CM | POA: Diagnosis present

## 2020-07-02 DIAGNOSIS — J9611 Chronic respiratory failure with hypoxia: Secondary | ICD-10-CM

## 2020-07-02 DIAGNOSIS — E860 Dehydration: Secondary | ICD-10-CM | POA: Diagnosis present

## 2020-07-02 DIAGNOSIS — Z20822 Contact with and (suspected) exposure to covid-19: Secondary | ICD-10-CM | POA: Diagnosis present

## 2020-07-02 DIAGNOSIS — Z940 Kidney transplant status: Secondary | ICD-10-CM | POA: Diagnosis not present

## 2020-07-02 DIAGNOSIS — M109 Gout, unspecified: Secondary | ICD-10-CM | POA: Diagnosis present

## 2020-07-02 DIAGNOSIS — I5032 Chronic diastolic (congestive) heart failure: Secondary | ICD-10-CM

## 2020-07-02 DIAGNOSIS — Z87891 Personal history of nicotine dependence: Secondary | ICD-10-CM | POA: Diagnosis not present

## 2020-07-02 DIAGNOSIS — D649 Anemia, unspecified: Secondary | ICD-10-CM | POA: Diagnosis present

## 2020-07-02 DIAGNOSIS — Z79899 Other long term (current) drug therapy: Secondary | ICD-10-CM | POA: Diagnosis not present

## 2020-07-02 DIAGNOSIS — R5381 Other malaise: Secondary | ICD-10-CM | POA: Diagnosis present

## 2020-07-02 DIAGNOSIS — R6510 Systemic inflammatory response syndrome (SIRS) of non-infectious origin without acute organ dysfunction: Secondary | ICD-10-CM

## 2020-07-02 DIAGNOSIS — R000 Tachycardia, unspecified: Secondary | ICD-10-CM | POA: Diagnosis present

## 2020-07-02 DIAGNOSIS — K219 Gastro-esophageal reflux disease without esophagitis: Secondary | ICD-10-CM | POA: Diagnosis present

## 2020-07-02 DIAGNOSIS — Z7951 Long term (current) use of inhaled steroids: Secondary | ICD-10-CM | POA: Diagnosis not present

## 2020-07-02 DIAGNOSIS — I130 Hypertensive heart and chronic kidney disease with heart failure and stage 1 through stage 4 chronic kidney disease, or unspecified chronic kidney disease: Secondary | ICD-10-CM | POA: Diagnosis present

## 2020-07-02 DIAGNOSIS — D84821 Immunodeficiency due to drugs: Secondary | ICD-10-CM | POA: Diagnosis present

## 2020-07-02 LAB — RESPIRATORY PANEL BY PCR

## 2020-07-02 LAB — BLOOD CULTURE ID PANEL (REFLEXED) - BCID2

## 2020-07-02 LAB — CBC WITH DIFFERENTIAL/PLATELET
Abs Immature Granulocytes: 0.15 10*3/uL — ABNORMAL HIGH (ref 0.00–0.07)
Basophils Absolute: 0.1 10*3/uL (ref 0.0–0.1)
Basophils Relative: 0 %
Eosinophils Absolute: 0.2 10*3/uL (ref 0.0–0.5)
Eosinophils Relative: 1 %
HCT: 32.1 % — ABNORMAL LOW (ref 36.0–46.0)
Hemoglobin: 9.7 g/dL — ABNORMAL LOW (ref 12.0–15.0)
Immature Granulocytes: 1 %
Lymphocytes Relative: 6 %
Lymphs Abs: 1.2 10*3/uL (ref 0.7–4.0)
MCH: 31.1 pg (ref 26.0–34.0)
MCHC: 30.2 g/dL (ref 30.0–36.0)
MCV: 102.9 fL — ABNORMAL HIGH (ref 80.0–100.0)
Monocytes Absolute: 0.5 10*3/uL (ref 0.1–1.0)
Monocytes Relative: 2 %
Neutro Abs: 19.1 10*3/uL — ABNORMAL HIGH (ref 1.7–7.7)
Neutrophils Relative %: 90 %
Platelets: 233 10*3/uL (ref 150–400)
RBC: 3.12 MIL/uL — ABNORMAL LOW (ref 3.87–5.11)
RDW: 19.9 % — ABNORMAL HIGH (ref 11.5–15.5)
WBC: 21.1 10*3/uL — ABNORMAL HIGH (ref 4.0–10.5)
nRBC: 0 % (ref 0.0–0.2)

## 2020-07-02 LAB — COMPREHENSIVE METABOLIC PANEL
ALT: 14 U/L (ref 0–44)
AST: 19 U/L (ref 15–41)
Albumin: 3.1 g/dL — ABNORMAL LOW (ref 3.5–5.0)
Alkaline Phosphatase: 81 U/L (ref 38–126)
Anion gap: 8 (ref 5–15)
BUN: 28 mg/dL — ABNORMAL HIGH (ref 8–23)
CO2: 25 mmol/L (ref 22–32)
Calcium: 9.7 mg/dL (ref 8.9–10.3)
Chloride: 102 mmol/L (ref 98–111)
Creatinine, Ser: 1.02 mg/dL — ABNORMAL HIGH (ref 0.44–1.00)
GFR calc Af Amer: >60 mL/min (ref >60)
GFR calc non Af Amer: 58 mL/min — ABNORMAL LOW (ref >60)
Glucose, Bld: 135 mg/dL — ABNORMAL HIGH (ref 70–99)
Potassium: 4.4 mmol/L (ref 3.5–5.1)
Sodium: 135 mmol/L (ref 135–145)
Total Bilirubin: 1.1 mg/dL (ref 0.3–1.2)
Total Protein: 7.6 g/dL (ref 6.5–8.1)

## 2020-07-02 LAB — URINALYSIS, ROUTINE W REFLEX MICROSCOPIC
Bilirubin Urine: NEGATIVE
Glucose, UA: NEGATIVE mg/dL
Ketones, ur: 5 mg/dL — AB
Leukocytes,Ua: NEGATIVE
Nitrite: NEGATIVE
Protein, ur: 100 mg/dL — AB
Specific Gravity, Urine: 1.016 (ref 1.005–1.030)
pH: 5 (ref 5.0–8.0)

## 2020-07-02 LAB — BLOOD GAS, ARTERIAL
Acid-Base Excess: 0.5 mmol/L (ref 0.0–2.0)
Bicarbonate: 25.2 mmol/L (ref 20.0–28.0)
O2 Saturation: 98.3 %
Patient temperature: 98.6
pCO2 arterial: 43.3 mmHg (ref 32.0–48.0)
pH, Arterial: 7.382 (ref 7.350–7.450)
pO2, Arterial: 112 mmHg — ABNORMAL HIGH (ref 83.0–108.0)

## 2020-07-02 LAB — CBC
HCT: 32 % — ABNORMAL LOW (ref 36.0–46.0)
Hemoglobin: 9.3 g/dL — ABNORMAL LOW (ref 12.0–15.0)
MCH: 30.5 pg (ref 26.0–34.0)
MCHC: 29.1 g/dL — ABNORMAL LOW (ref 30.0–36.0)
MCV: 104.9 fL — ABNORMAL HIGH (ref 80.0–100.0)
Platelets: 204 10*3/uL (ref 150–400)
RBC: 3.05 MIL/uL — ABNORMAL LOW (ref 3.87–5.11)
RDW: 19.7 % — ABNORMAL HIGH (ref 11.5–15.5)
WBC: 17.9 10*3/uL — ABNORMAL HIGH (ref 4.0–10.5)
nRBC: 0 % (ref 0.0–0.2)

## 2020-07-02 LAB — I-STAT CHEM 8, ED
BUN: 27 mg/dL — ABNORMAL HIGH (ref 8–23)
Calcium, Ion: 1.31 mmol/L (ref 1.15–1.40)
Chloride: 103 mmol/L (ref 98–111)
Creatinine, Ser: 1 mg/dL (ref 0.44–1.00)
Glucose, Bld: 132 mg/dL — ABNORMAL HIGH (ref 70–99)
HCT: 31 % — ABNORMAL LOW (ref 36.0–46.0)
Hemoglobin: 10.5 g/dL — ABNORMAL LOW (ref 12.0–15.0)
Potassium: 4.5 mmol/L (ref 3.5–5.1)
Sodium: 138 mmol/L (ref 135–145)
TCO2: 26 mmol/L (ref 22–32)

## 2020-07-02 LAB — BRAIN NATRIURETIC PEPTIDE: B Natriuretic Peptide: 557.8 pg/mL — ABNORMAL HIGH (ref 0.0–100.0)

## 2020-07-02 LAB — PROTIME-INR
INR: 1.2 (ref 0.8–1.2)
Prothrombin Time: 14.6 seconds (ref 11.4–15.2)

## 2020-07-02 LAB — APTT: aPTT: 31 seconds (ref 24–36)

## 2020-07-02 LAB — LACTIC ACID, PLASMA
Lactic Acid, Venous: 0.8 mmol/L (ref 0.5–1.9)
Lactic Acid, Venous: 0.8 mmol/L (ref 0.5–1.9)

## 2020-07-02 LAB — SARS CORONAVIRUS 2 BY RT PCR (HOSPITAL ORDER, PERFORMED IN ~~LOC~~ HOSPITAL LAB): SARS Coronavirus 2: NEGATIVE

## 2020-07-02 LAB — POC SARS CORONAVIRUS 2 AG -  ED: SARS Coronavirus 2 Ag: NEGATIVE

## 2020-07-02 IMAGING — CT CT HEAD W/O CM
4 of 8 series · 16 of 47 positions shown, 17 images · non-contrast
Comparison: None.

CLINICAL DATA: Encephalopathy

EXAM:
CT HEAD WITHOUT CONTRAST
TECHNIQUE: Contiguous axial images were obtained from the base of the skull
through the vertex without intravenous contrast.

[Series 3: head bone · axial · 0.39mm/px · z∈[-27,+91]mm · 7 of 83 slices shown]
[im 6/83  bone]
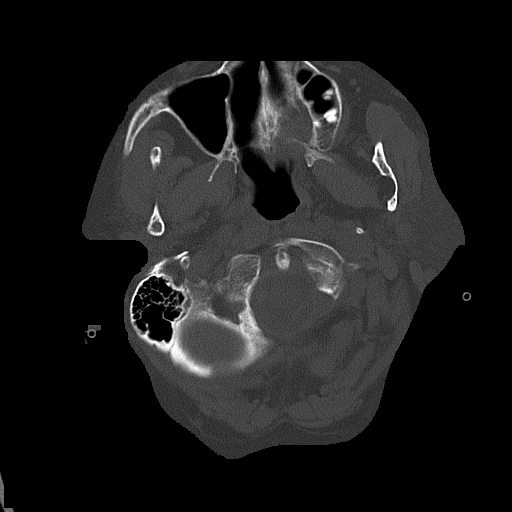
[im 18/83  bone]
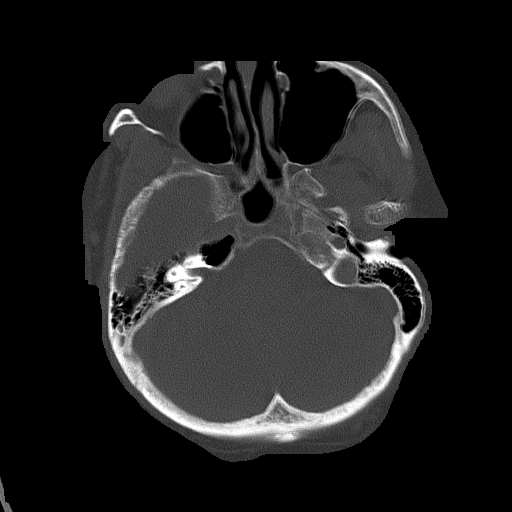
[im 30/83  bone]
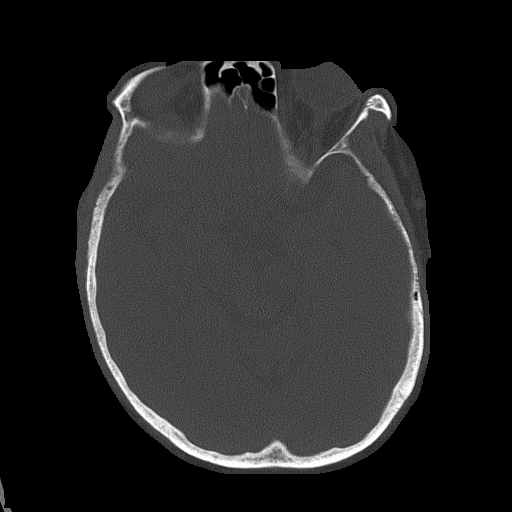
[im 36/83  bone]
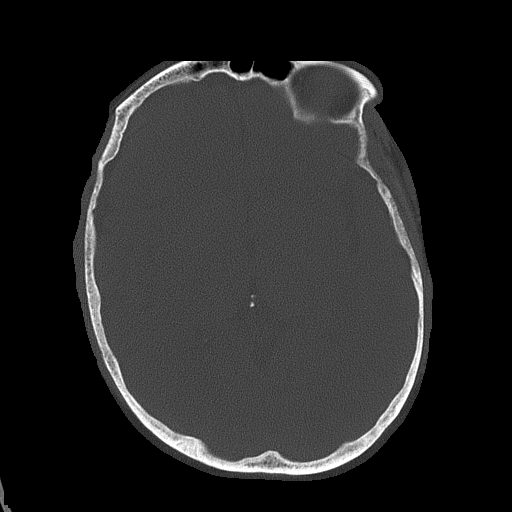
[im 47/83  bone]
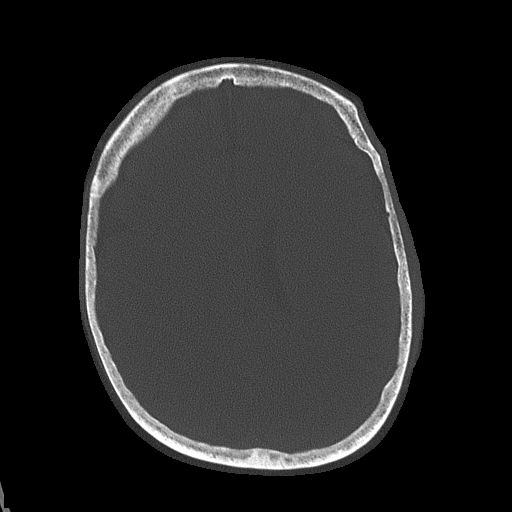
[im 53/83  bone]
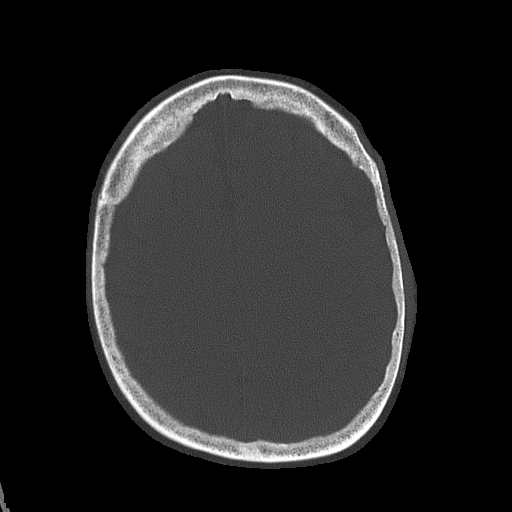
[im 65/83  bone]
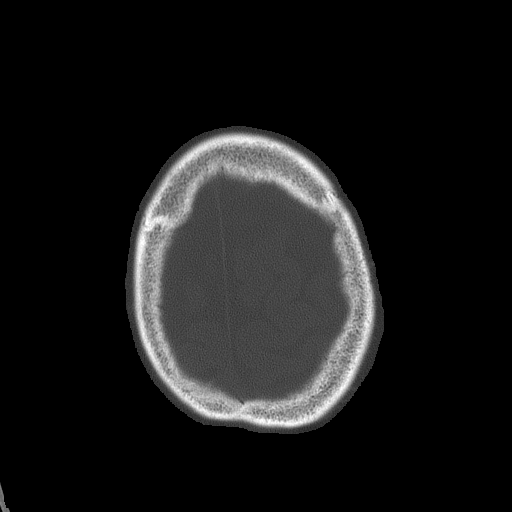

[Series 4: head without · axial · non-contrast · 0.39mm/px · z∈[-7,+88]mm · 4 of 33 slices shown, 5 images]
[im 7/33  brain]
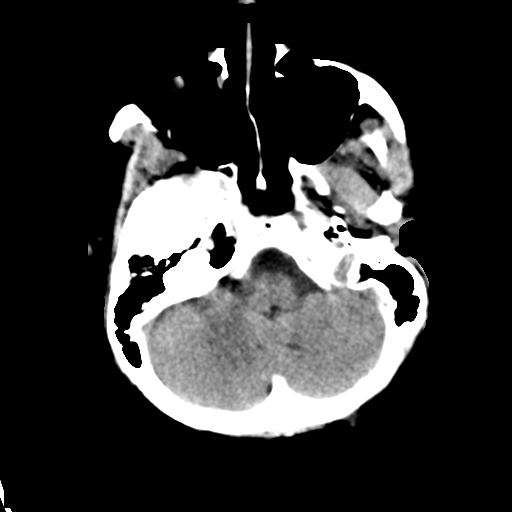
[im 7/33  bone]
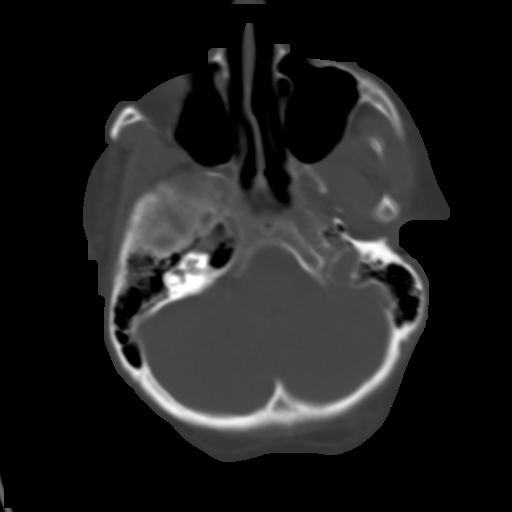
[im 13/33  brain]
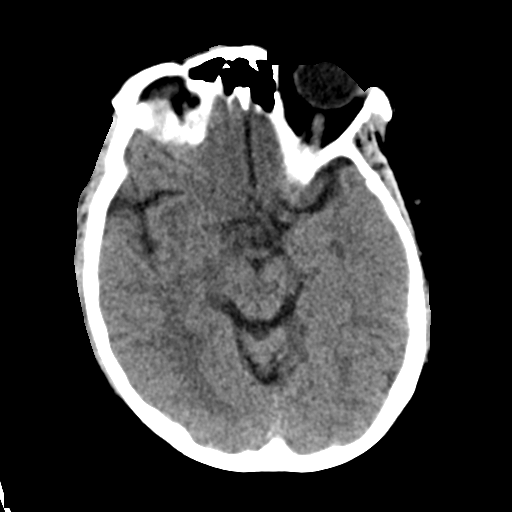
[im 20/33  brain]
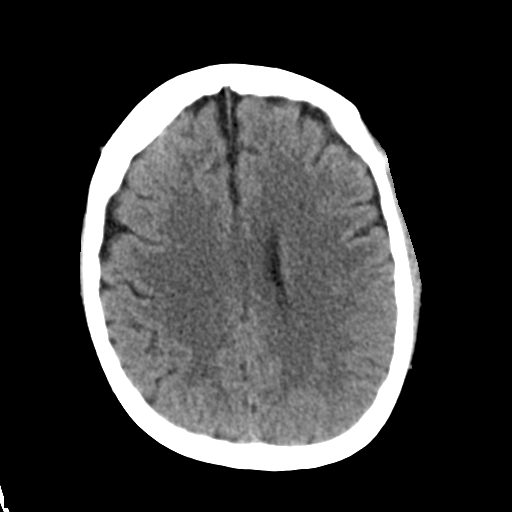
[im 26/33  brain]
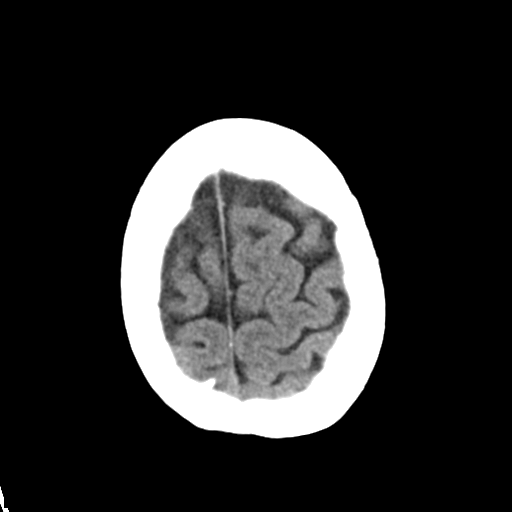

[Series 5: head without cor · coronal · non-contrast · 0.31mm/px · 3 of 67 slices shown]
[im 14/67  brain]
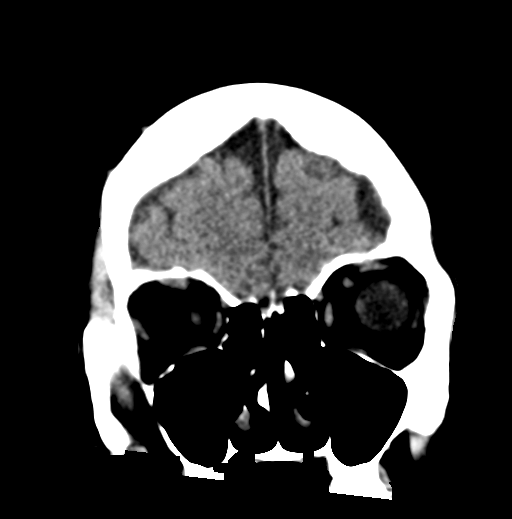
[im 27/67  brain]
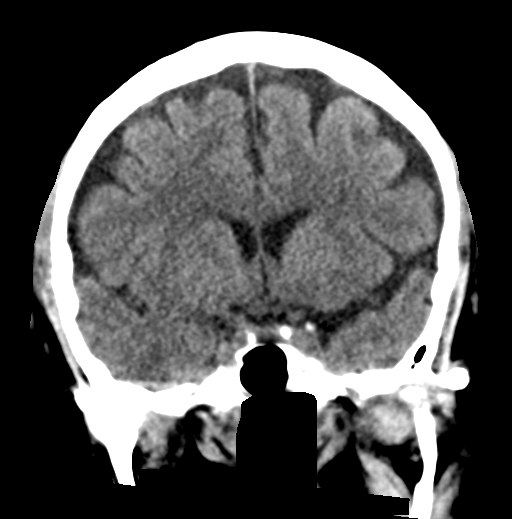
[im 40/67  brain]
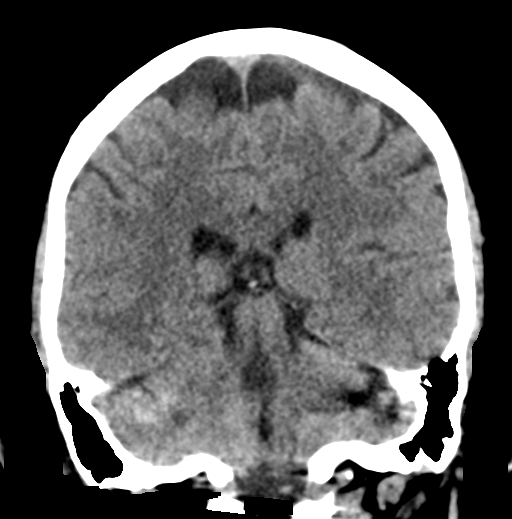

[Series 10: head without sag · sagittal · non-contrast · 0.16mm/px · 2 of 67 slices shown]
[im 23/67  brain]
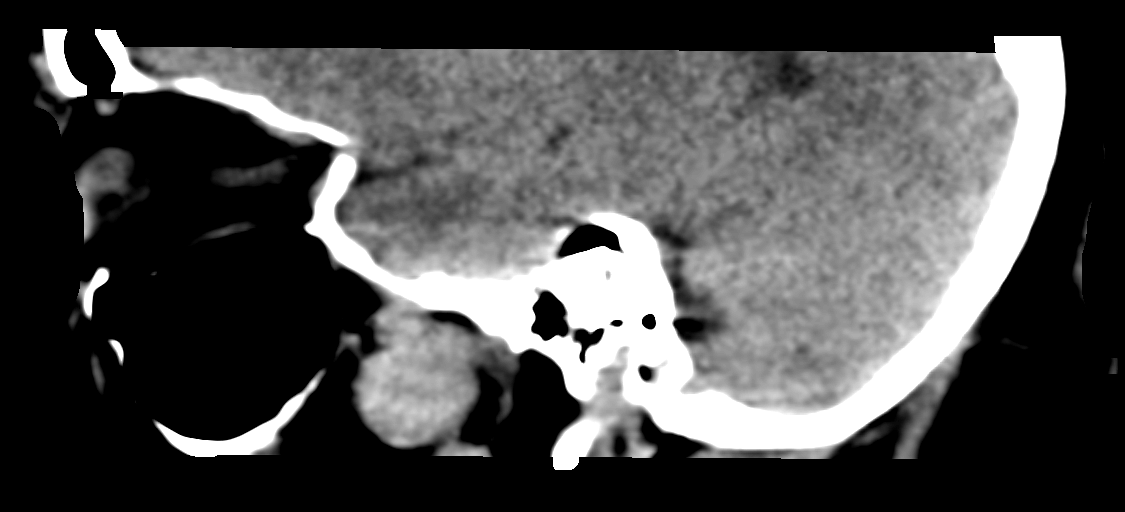
[im 45/67  brain]
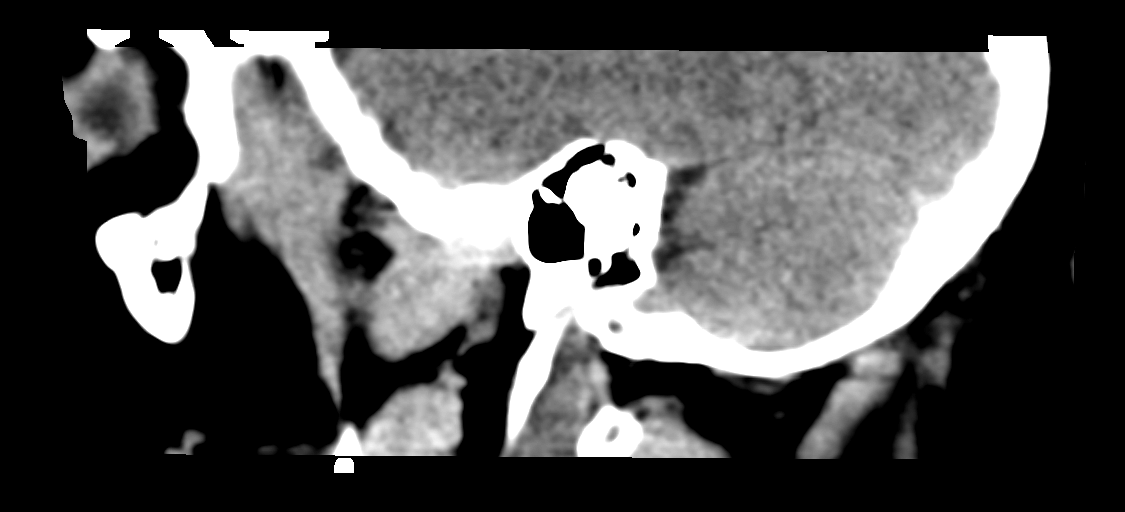

[16 of 47 positions shown; findings below may reference images not displayed]

FINDINGS: Brain: No evidence of acute infarction, hemorrhage, hydrocephalus,
extra-axial collection or mass lesion/mass effect.

Vascular: Negative for hyperdense vessel

Skull: Negative

Sinuses/Orbits: Paranasal sinuses clear. Bilateral cataract surgery.

Other: None
IMPRESSION: Negative CT head

## 2020-07-02 MED ORDER — ACETAMINOPHEN 325 MG PO TABS
650.0000 mg | ORAL_TABLET | Freq: Once | ORAL | Status: AC
  Administered 2020-07-02: 650 mg via ORAL
  Filled 2020-07-02: qty 2

## 2020-07-02 MED ORDER — ACETAMINOPHEN 650 MG RE SUPP: 650.0000 mg | Freq: Four times a day (QID) | RECTAL | Status: DC | PRN

## 2020-07-02 MED ORDER — ENOXAPARIN SODIUM 40 MG/0.4ML ~~LOC~~ SOLN
40.0000 mg | SUBCUTANEOUS | Status: DC
  Administered 2020-07-02 – 2020-07-03 (×2): 40 mg via SUBCUTANEOUS
  Filled 2020-07-02 (×2): qty 0.4

## 2020-07-02 MED ORDER — ALLOPURINOL 300 MG PO TABS
300.0000 mg | ORAL_TABLET | Freq: Every day | ORAL | Status: DC
  Administered 2020-07-02 – 2020-07-03 (×2): 300 mg via ORAL
  Filled 2020-07-02 (×2): qty 1

## 2020-07-02 MED ORDER — TACROLIMUS 1 MG PO CAPS
2.0000 mg | ORAL_CAPSULE | Freq: Every morning | ORAL | Status: DC
  Administered 2020-07-03: 2 mg via ORAL
  Filled 2020-07-02: qty 2

## 2020-07-02 MED ORDER — SODIUM CHLORIDE 0.9% FLUSH: 3.0000 mL | Freq: Two times a day (BID) | INTRAVENOUS | Status: DC

## 2020-07-02 MED ORDER — METHYLPREDNISOLONE SODIUM SUCC 125 MG IJ SOLR
40.0000 mg | Freq: Two times a day (BID) | INTRAMUSCULAR | Status: DC
  Administered 2020-07-02 – 2020-07-03 (×3): 40 mg via INTRAVENOUS
  Filled 2020-07-02 (×3): qty 2

## 2020-07-02 MED ORDER — MYCOPHENOLATE SODIUM 180 MG PO TBEC
180.0000 mg | DELAYED_RELEASE_TABLET | Freq: Two times a day (BID) | ORAL | Status: DC
  Administered 2020-07-02 – 2020-07-03 (×3): 180 mg via ORAL
  Filled 2020-07-02 (×4): qty 1

## 2020-07-02 MED ORDER — VANCOMYCIN HCL IN DEXTROSE 1-5 GM/200ML-% IV SOLN
1000.0000 mg | INTRAVENOUS | Status: DC
  Administered 2020-07-03: 1000 mg via INTRAVENOUS
  Filled 2020-07-02: qty 200

## 2020-07-02 MED ORDER — SODIUM CHLORIDE 0.9 % IV SOLN
2.0000 g | Freq: Three times a day (TID) | INTRAVENOUS | Status: DC
  Administered 2020-07-02 (×2): 2 g via INTRAVENOUS
  Filled 2020-07-02 (×3): qty 2

## 2020-07-02 MED ORDER — BRIMONIDINE TARTRATE 0.15 % OP SOLN
1.0000 [drp] | Freq: Two times a day (BID) | OPHTHALMIC | Status: DC
  Administered 2020-07-02 – 2020-07-03 (×3): 1 [drp] via OPHTHALMIC
  Filled 2020-07-02: qty 5

## 2020-07-02 MED ORDER — VANCOMYCIN HCL IN DEXTROSE 1-5 GM/200ML-% IV SOLN
1000.0000 mg | Freq: Once | INTRAVENOUS | Status: DC
  Filled 2020-07-02: qty 200

## 2020-07-02 MED ORDER — TACROLIMUS 1 MG PO CAPS
3.0000 mg | ORAL_CAPSULE | Freq: Every morning | ORAL | Status: DC
  Administered 2020-07-02: 3 mg via ORAL
  Filled 2020-07-02: qty 3

## 2020-07-02 MED ORDER — ACETAMINOPHEN 325 MG PO TABS: 650.0000 mg | ORAL_TABLET | Freq: Four times a day (QID) | ORAL | Status: DC | PRN

## 2020-07-02 MED ORDER — SODIUM CHLORIDE 0.9 % IV SOLN: INTRAVENOUS | Status: DC

## 2020-07-02 MED ORDER — METRONIDAZOLE IN NACL 5-0.79 MG/ML-% IV SOLN
500.0000 mg | Freq: Three times a day (TID) | INTRAVENOUS | Status: DC
  Administered 2020-07-02 – 2020-07-03 (×4): 500 mg via INTRAVENOUS
  Filled 2020-07-02 (×4): qty 100

## 2020-07-02 MED ORDER — ALBUTEROL SULFATE HFA 108 (90 BASE) MCG/ACT IN AERS: 2.0000 | INHALATION_SPRAY | RESPIRATORY_TRACT | Status: DC | PRN

## 2020-07-02 MED ORDER — SODIUM CHLORIDE 0.9 % IV SOLN
2.0000 g | Freq: Once | INTRAVENOUS | Status: AC
  Administered 2020-07-02: 2 g via INTRAVENOUS
  Filled 2020-07-02: qty 2

## 2020-07-02 MED ORDER — SODIUM CHLORIDE 0.9 % IV BOLUS
500.0000 mL | Freq: Once | INTRAVENOUS | Status: AC
  Administered 2020-07-02: 500 mL via INTRAVENOUS

## 2020-07-02 MED ORDER — TACROLIMUS 1 MG PO CAPS
1.0000 mg | ORAL_CAPSULE | Freq: Every day | ORAL | Status: DC
  Administered 2020-07-02: 1 mg via ORAL
  Filled 2020-07-02 (×2): qty 1

## 2020-07-02 MED ORDER — POLYETHYLENE GLYCOL 3350 17 G PO PACK: 17.0000 g | PACK | Freq: Every day | ORAL | Status: DC | PRN

## 2020-07-02 MED ORDER — METRONIDAZOLE IN NACL 5-0.79 MG/ML-% IV SOLN
500.0000 mg | Freq: Once | INTRAVENOUS | Status: AC
  Administered 2020-07-02: 500 mg via INTRAVENOUS
  Filled 2020-07-02: qty 100

## 2020-07-02 MED ORDER — VANCOMYCIN HCL 1500 MG/300ML IV SOLN
1500.0000 mg | Freq: Once | INTRAVENOUS | Status: AC
  Administered 2020-07-02: 1500 mg via INTRAVENOUS
  Filled 2020-07-02: qty 300

## 2020-07-02 MED ORDER — SODIUM CHLORIDE 0.9 % IV SOLN: 250.0000 mL | INTRAVENOUS | Status: DC | PRN

## 2020-07-02 MED ORDER — DORZOLAMIDE HCL 2 % OP SOLN
1.0000 [drp] | Freq: Every day | OPHTHALMIC | Status: DC
  Administered 2020-07-02 – 2020-07-03 (×2): 1 [drp] via OPHTHALMIC
  Filled 2020-07-02: qty 10

## 2020-07-02 MED ORDER — PREDNISONE 5 MG PO TABS
7.5000 mg | ORAL_TABLET | Freq: Every day | ORAL | Status: DC
  Filled 2020-07-02: qty 2
  Filled 2020-07-02: qty 1

## 2020-07-02 MED ORDER — SODIUM CHLORIDE 0.9% FLUSH: 3.0000 mL | INTRAVENOUS | Status: DC | PRN

## 2020-07-02 MED ORDER — TACROLIMUS 0.5 MG PO CAPS: 0.5000 mg | ORAL_CAPSULE | Freq: Every day | ORAL | Status: DC

## 2020-07-02 MED ORDER — ASPIRIN 81 MG PO CHEW
81.0000 mg | CHEWABLE_TABLET | Freq: Every day | ORAL | Status: DC
  Administered 2020-07-02 – 2020-07-03 (×2): 81 mg via ORAL
  Filled 2020-07-02 (×2): qty 1

## 2020-07-02 MED ORDER — ASCORBIC ACID 500 MG PO TABS
500.0000 mg | ORAL_TABLET | Freq: Every day | ORAL | Status: DC
  Administered 2020-07-02 – 2020-07-03 (×2): 500 mg via ORAL
  Filled 2020-07-02 (×2): qty 1

## 2020-07-02 MED ORDER — VITAMIN B-12 1000 MCG PO TABS
1000.0000 ug | ORAL_TABLET | Freq: Every day | ORAL | Status: DC
  Administered 2020-07-02 – 2020-07-03 (×2): 1000 ug via ORAL
  Filled 2020-07-02 (×2): qty 1

## 2020-07-02 MED ORDER — LACTATED RINGERS IV SOLN: INTRAVENOUS | Status: DC

## 2020-07-02 MED ORDER — FLUOXETINE HCL 20 MG PO CAPS
40.0000 mg | ORAL_CAPSULE | Freq: Every day | ORAL | Status: DC
  Administered 2020-07-02 – 2020-07-03 (×2): 40 mg via ORAL
  Filled 2020-07-02 (×2): qty 2

## 2020-07-02 MED ORDER — GABAPENTIN 100 MG PO CAPS
200.0000 mg | ORAL_CAPSULE | Freq: Three times a day (TID) | ORAL | Status: DC
  Administered 2020-07-02 – 2020-07-03 (×4): 200 mg via ORAL
  Filled 2020-07-02 (×4): qty 2

## 2020-07-02 MED ORDER — CYCLOSPORINE 0.05 % OP EMUL
1.0000 [drp] | Freq: Two times a day (BID) | OPHTHALMIC | Status: DC
  Administered 2020-07-02 – 2020-07-03 (×3): 1 [drp] via OPHTHALMIC
  Filled 2020-07-02 (×4): qty 1

## 2020-07-02 MED ORDER — BUDESONIDE 0.25 MG/2ML IN SUSP
0.2500 mg | Freq: Two times a day (BID) | RESPIRATORY_TRACT | Status: DC
  Administered 2020-07-02 – 2020-07-03 (×2): 0.25 mg via RESPIRATORY_TRACT
  Filled 2020-07-02 (×2): qty 2

## 2020-07-02 MED ORDER — ONDANSETRON HCL 4 MG PO TABS: 4.0000 mg | ORAL_TABLET | Freq: Four times a day (QID) | ORAL | Status: DC | PRN

## 2020-07-02 MED ORDER — IPRATROPIUM BROMIDE HFA 17 MCG/ACT IN AERS
2.0000 | INHALATION_SPRAY | Freq: Four times a day (QID) | RESPIRATORY_TRACT | Status: DC
  Filled 2020-07-02: qty 12.9

## 2020-07-02 MED ORDER — PANTOPRAZOLE SODIUM 40 MG PO TBEC
40.0000 mg | DELAYED_RELEASE_TABLET | Freq: Two times a day (BID) | ORAL | Status: DC
  Administered 2020-07-02 – 2020-07-03 (×3): 40 mg via ORAL
  Filled 2020-07-02 (×3): qty 1

## 2020-07-02 MED ORDER — ONDANSETRON HCL 4 MG/2ML IJ SOLN: 4.0000 mg | Freq: Four times a day (QID) | INTRAMUSCULAR | Status: DC | PRN

## 2020-07-02 MED ORDER — SODIUM CHLORIDE 0.9% FLUSH
3.0000 mL | Freq: Two times a day (BID) | INTRAVENOUS | Status: DC
  Administered 2020-07-02: 3 mL via INTRAVENOUS

## 2020-07-02 NOTE — H&P (Signed)
History and Physical    ALIE MOUDY OVZ:[id number] DOB: 11/04/1956 DOA: 07/01/2020  PCP: [name], MD   Patient coming from: Home   Chief Complaint: Fever, lethargy, SOB   HPI: April [name] is a 64 y.o. 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  Patient reports that she been in her usual state of health until yesterday when she developed lethargy, chills, and increased dyspnea.  She reports being febrile and took some Tylenol at home.  She continued to have chills and lethargy, and ultimately came into the ED.  She denies any abdominal pain, flank pain, or dysuria.  She denies any neck stiffness or headache.  She denies any rashes or wounds.  She reports feeling more dyspneic than usual but denies change in her chronic cough or sputum production.  ED Course: Upon arrival to the ED, patient is found to be 38.3 C, saturating upper 90s on 3 L/min of supplemental oxygen, tachypneic, tachycardic, and with stable blood pressure.  EKG features sinus tachycardia with rate 114 and nonspecific IVCD.  Chest x-ray with stable cardiomegaly and vascular congestion, persistent small bilateral pleural effusions, and bibasilar airspace disease that likely reflects atelectasis.  Chemistry panel is notable for creatinine 1.02, similar to priors.  CBC features a leukocytosis to 21,100 and a chronic anemia with hemoglobin 9.7.  Covid antigen and PCR tests are both negative.  Lactic acid is reassuringly normal.  BNP is elevated 558.  Blood and urine cultures were collected and the patient was treated with IV fluids, vancomycin, cefepime, and Flagyl.  Review of  Systems:  All other systems reviewed and apart from HPI, are negative.  Past Medical History:  Diagnosis Date  . Arthritis   . Asthma   . CHF (congestive heart failure) ([place])   . COPD (chronic obstructive pulmonary disease) ([place])   . Depression   . Essential hypertension 02/08/2020  . GERD (gastroesophageal reflux disease)   . Hyperlipidemia   . Hyperparathyroidism   . Polycystic kidney   . PONV (postoperative nausea and vomiting)     Past Surgical History:  Procedure Laterality Date  . 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 BILATERAL OOPHORECTOMY    . BIOPSY  05/19/2020   Procedure: BIOPSY;  Surgeon: Carol Ada, MD;  Location: [facility] ENDOSCOPY;  Service: Endoscopy;;  . CARPAL TUNNEL RELEASE  2000  . CESAREAN SECTION    . COLONOSCOPY WITH PROPOFOL N/A 05/19/2020   Procedure: COLONOSCOPY WITH PROPOFOL;  Surgeon: Carol Ada, MD;  Location: [place];  Service: Endoscopy;  Laterality: N/A;  . ENTEROSCOPY N/A 05/19/2020   Procedure: ENTEROSCOPY;  Surgeon: Carol Ada, MD;  Location: [place];  Service: Endoscopy;  Laterality: N/A;  . INSERTION OF DIALYSIS CATHETER  03/31/2012   Procedure: INSERTION OF DIALYSIS CATHETER;  Surgeon: Angelia Mould, MD;  Location: [place];  Service: Vascular;  Laterality: N/A;  insertion of dialysis catheter right internal jugular vein  . 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 ORIF TIBIA FRACTURE Left 12/23/2018   Procedure: OPEN REDUCTION INTERNAL FIXATION (ORIF) TIBIA FRACTURE;  Surgeon: Shona Needles, MD;  Location: [place];  Service: Orthopedics;  Laterality: Left;  . ORIF TIBIA PLATEAU Right 12/23/2018  Procedure: OPEN REDUCTION INTERNAL FIXATION (ORIF) TIBIAL PLATEAU;  Surgeon: Shona Needles, MD;  Location: [place];  Service: Orthopedics;  Laterality: Right;  . ORIF WRIST FRACTURE Left 12/08/2019   Procedure: OPEN REDUCTION INTERNAL FIXATION (ORIF) WRIST FRACTURE, REPAIR LACERATION LEFT FOREARM;  Surgeon: Dorna Leitz, MD;  Location: [facility];  Service: Orthopedics;  Laterality: Left;  . TONSILLECTOMY  1967  . TOTAL HIP ARTHROPLASTY Left 12/08/2019   Procedure: TOTAL HIP ARTHROPLASTY ANTERIOR APPROACH;  Surgeon: Dorna Leitz, MD;  Location: [facility];  Service: Orthopedics;  Laterality: Left;  . TUBAL LIGATION  2010    Social History:   reports that she quit smoking about 22 years ago. Her smoking use included cigarettes. She has a 3.75 pack-year smoking history. She has never used smokeless tobacco. She reports that she does not drink alcohol and does not use drugs.  Allergies  Allergen Reactions  . Infed [Iron Dextran] Other (See Comments)    Chest tightness  . Pentamidine Itching, Shortness Of Breath and Swelling  . Erythromycin [Erythromycin] Other (See Comments)    Mouth Ulcers  . Iohexol Other (See Comments)    Per patient "she has had a kidney transplant and should never have contrast"  . Oxycodone Nausea Only and Nausea And Vomiting  . Erythromycin Rash    Causes breakout in mouth  . Ultram [Tramadol Hcl] Anxiety    Family History  Problem Relation Age of Onset  . Hypertension Mother   . Heart disease Father        CABG history  . Polycystic kidney disease Father   . Polycystic kidney disease Brother      Prior to Admission medications   Medication Sig Start Date End Date Taking? Authorizing Provider  acetaminophen (TYLENOL) 325 MG tablet Take 2 tablets (650 mg total) by mouth every 6 (six) hours as needed for mild pain (or Fever >/= 101). 05/22/20   Elgergawy, Silver Huguenin, MD  albuterol (VENTOLIN HFA) 108 (90 Base) MCG/ACT inhaler Inhale 2 puffs into the lungs every 4 (four) hours as needed for wheezing or shortness of breath. 01/04/20   Angiulli, Lavon Paganini, PA-C  allopurinol (ZYLOPRIM) 300 MG tablet Take 1 tablet (300 mg total) by mouth daily. 01/04/20   Angiulli, Lavon Paganini, PA-C  ascorbic acid (VITAMIN C) 500 MG tablet Take 500 mg by mouth daily.    [provider]  aspirin 81  MG chewable tablet Chew 81 mg by mouth daily.    [provider]  brimonidine (ALPHAGAN P) 0.1 % SOLN Place 1 drop into both eyes 2 (two) times daily.     [provider]  budesonide (PULMICORT) 1 MG/2ML nebulizer solution Take 1 mg by nebulization in the morning and at bedtime.    [provider]  cholecalciferol (VITAMIN D3) 25 MCG (1000 UNIT) tablet Take 1 tablet (1,000 Units total) by mouth daily. 01/04/20   Angiulli, Lavon Paganini, PA-C  collagenase (SANTYL) ointment Apply 1 application topically daily. Sacral wound    [provider]  cyanocobalamin 1000 MCG tablet Take 1,000 mcg by mouth daily.    [provider]  cycloSPORINE (RESTASIS) 0.05 % ophthalmic emulsion Place 1 drop into both eyes 2 (two) times daily. 01/04/20   Angiulli, Lavon Paganini, PA-C  dorzolamide (TRUSOPT) 2 % ophthalmic solution Place 1 drop into both eyes daily. 06/25/19   [provider]  ferrous sulfate 325 (65 FE) MG tablet Take 325 mg by mouth daily with breakfast.    [provider]  FLUoxetine (PROZAC) 40 MG capsule Take 1 capsule (40 mg total) by mouth daily. 01/04/20   Angiulli, Lavon Paganini, PA-C  furosemide (LASIX) 40 MG tablet Take 1 tablet (40 mg total) by mouth daily. 02/07/20   Raiford Noble Latif, DO  gabapentin (NEURONTIN) 100 MG capsule Take 200 mg by mouth 3 (three) times daily.    [provider]  guaiFENesin (MUCINEX) 600 MG 12 hr tablet Take 1 tablet (600 mg total) by mouth 2 (two) times daily. 02/06/20   Sheikh, Georgina Quint Latif, DO  insulin aspart (NOVOLOG) 100 UNIT/ML injection Inject 2-12 Units into the skin See admin instructions. Per sliding scale subcutaneous, once a day - If blood sugar is: 0 - 200: 0 units  201 - 250: 2 units  251 - 300: 4 units  301 - 350: 6 units  351 - 400: 8 units  401 - 450: 10 units  451 - 500: 12 units  500 or greater: Call MD and give 14 units, then recheck blood sugar in 2 hours, at that time if blood sugar is greater  than 400 or less than 80, call PEC TRIAGE. Patient not taking: Reported on 06/19/2020    [provider]  ipratropium (ATROVENT HFA) 17 MCG/ACT inhaler Inhale 1 puff into the lungs every 6 (six) hours. For COPD    [provider]  isosorbide mononitrate (IMDUR) 60 MG 24 hr tablet Take 1 tablet (60 mg total) by mouth daily. 05/23/20   Elgergawy, Silver Huguenin, MD  levalbuterol Penne Lash) 0.63 MG/3ML nebulizer solution Take 0.63 mg by nebulization See admin instructions. Inhale 0.12m once daily.  May take 0.620mevery six hours as needed COPD.    [provider]  magnesium oxide (MAG-OX) 400 MG tablet Take 1 tablet (400 mg total) by mouth 2 (two) times daily. 01/04/20   Angiulli, DaLavon PaganiniPA-C  Multiple Vitamin (MULTIVITAMIN WITH MINERALS) TABS tablet Take 1 tablet by mouth daily. 02/24/20   EzAlma FriendlyMD  mycophenolate (MYFORTIC) 180 MG EC tablet Take 180 mg by mouth 2 (two) times daily.    [provider]  pantoprazole (PROTONIX) 40 MG tablet Take 1 tablet (40 mg total) by mouth 2 (two) times daily. 01/04/20   Angiulli, DaLavon PaganiniPA-C  predniSONE (DELTASONE) 5 MG tablet Take 20 mg for 3 days then 10 mg for 3 days then resume chronic home dose of 7.5 mg daily. 06/06/20   Regalado, Belkys A, MD  PROGRAF 1 MG capsule Take 3 mg by mouth daily.  03/22/19   [provider]  sennosides-docusate sodium (SENOKOT-S) 8.6-50 MG tablet Take 2 tablets by mouth daily.     [provider]  tacrolimus (PROGRAF) 0.5 MG capsule Take 0.5 mg by mouth at bedtime. Takes brand name Prograf    [provider]  TRAVATAN Z 0.004 % SOLN ophthalmic solution Place 1 drop into both eyes at bedtime. 07/03/16   [provider]  valACYclovir (VALTREX) 500 MG tablet Take 500 mg by mouth 2 (two) times daily.    [provider]  verapamil (CALAN-SR) 240 MG CR tablet Take 240 mg by mouth daily.    [provider]    Physical Exam: Vitals:    07/02/20 0300 07/02/20 0345 07/02/20 0400 07/02/20 0430  BP: (!) 155/79 (!) 154/76 (!) 152/79 (!) 153/81  Pulse: 97 97 96 94  Resp: (!) 24 (!) 23 (!) 22 (!) 23  Temp:  99.6 F (37.6 C)    TempSrc:  Rectal    SpO2: 100% 99% 99% 100%  Weight:      Height:        Constitutional: NAD, calm  Eyes: PERTLA, lids and conjunctivae normal ENMT: Mucous membranes are moist. Posterior pharynx clear of any exudate or lesions.   Neck: normal, supple, no masses, no thyromegaly Respiratory: Mild tachypnea, no wheezing. No accessory muscle use.  Cardiovascular: S1 & S2 heard, regular rate and rhythm. No extremity edema.   Abdomen: No distension, no tenderness, soft. Bowel sounds active.  Musculoskeletal: no clubbing / cyanosis. No joint deformity upper and lower extremities.   Skin: no significant rashes, lesions, ulcers. Warm, dry, well-perfused. Neurologic: CN 2-12 grossly intact. Sensation to light touch intact. Resting tremor. Moving all extremities.  Psychiatric: Sleeping, wakes to voice. Oriented to person, place, and situation.     Labs and Imaging on Admission: I have personally reviewed following labs and imaging studies  CBC: Recent Labs  Lab 07/01/20 2334 07/02/20 0030  WBC 21.1*  --   NEUTROABS 19.1*  --   HGB 9.7* 10.5*  HCT 32.1* 31.0*  MCV 102.9*  --   PLT 233  --    Basic Metabolic Panel: Recent Labs  Lab 07/01/20 2334 07/02/20 0030  NA 135 138  K 4.4 4.5  CL 102 103  CO2 25  --   GLUCOSE 135* 132*  BUN 28* 27*  CREATININE 1.02* 1.00  CALCIUM 9.7  --    GFR: Estimated Creatinine Clearance: 52.3 mL/min (by C-G formula based on SCr of 1 mg/dL). Liver Function Tests: Recent Labs  Lab 07/01/20 2334  AST 19  ALT 14  ALKPHOS 81  BILITOT 1.1  PROT 7.6  ALBUMIN 3.1*   No results for input(s): LIPASE, AMYLASE in the last 168 hours. No results for input(s): AMMONIA in the last 168 hours. Coagulation Profile: Recent Labs  Lab 07/01/20 2334  INR 1.2    Cardiac Enzymes: No results for input(s): CKTOTAL, CKMB, CKMBINDEX, TROPONINI in the last 168 hours. BNP (last 3 results) No results for input(s): PROBNP in the last 8760 hours. HbA1C: No results for input(s): HGBA1C in the last 72 hours. CBG: No results for input(s): GLUCAP in the last 168 hours. Lipid Profile: No results for input(s): CHOL, HDL, LDLCALC, TRIG, CHOLHDL, LDLDIRECT in the last 72 hours. Thyroid Function Tests: No results for input(s): TSH, T4TOTAL, FREET4, T3FREE, THYROIDAB in the last 72 hours. Anemia Panel: No results for input(s): VITAMINB12, FOLATE, FERRITIN, TIBC, IRON, RETICCTPCT in the last 72 hours. Urine analysis:    Component Value Date/Time   COLORURINE YELLOW 07/02/2020 0315   APPEARANCEUR CLEAR 07/02/2020 0315   LABSPEC 1.016 07/02/2020 0315   PHURINE 5.0 07/02/2020 0315   GLUCOSEU NEGATIVE 07/02/2020 0315   HGBUR SMALL (A) 07/02/2020 0315   BILIRUBINUR NEGATIVE 07/02/2020 0315   KETONESUR 5 (A) 07/02/2020 0315   PROTEINUR 100 (A) 07/02/2020 0315   UROBILINOGEN 0.2 05/15/2013 0733   NITRITE NEGATIVE 07/02/2020 0315   LEUKOCYTESUR NEGATIVE 07/02/2020 0315   Sepsis Labs: @LABRCNTIP (procalcitonin:4,lacticidven:4) ) Recent Results (from the past 240 hour(s))  SARS Coronavirus 2 by RT PCR (hospital order, performed in [facility] hospital lab) Nasopharyngeal Nasopharyngeal Swab     Status: None   Collection Time: 07/02/20  3:00 AM   Specimen: Nasopharyngeal Swab  Result Value Ref Range Status   SARS Coronavirus 2 NEGATIVE NEGATIVE Final    Comment: (NOTE) SARS-CoV-2 target nucleic acids are NOT DETECTED.  The SARS-CoV-2 RNA is generally detectable in upper  and lower respiratory specimens during the acute phase of infection. The lowest concentration of SARS-CoV-2 viral copies this assay can detect is 250 copies / mL. A negative result does not preclude SARS-CoV-2 infection and should not be used as the sole basis for treatment or other patient  management decisions.  A negative result may occur with improper specimen collection / handling, submission of specimen other than nasopharyngeal swab, presence of viral mutation(s) within the areas targeted by this assay, and inadequate number of viral copies (<250 copies / mL). A negative result must be combined with clinical observations, patient history, and epidemiological information.  Fact Sheet for Patients:   StrictlyIdeas.no  Fact Sheet for Healthcare Providers: BankingDealers.co.za  This test is not yet approved or  cleared by the [place] FDA and has been authorized for detection and/or diagnosis of SARS-CoV-2 by FDA under an Emergency Use Authorization (EUA).  This EUA will remain in effect (meaning this test can be used) for the duration of the COVID-19 declaration under Section 564(b)(1) of the Act, 21 U.S.C. section 360bbb-3(b)(1), unless the authorization is terminated or revoked sooner.  Performed at [facility], [place] [address], [place], [place] [address]      Radiological Exams on Admission: DG Chest Port 1 View  Result Date: 07/01/2020 CLINICAL DATA:  Worsening shortness of breath. EXAM: PORTABLE CHEST 1 VIEW COMPARISON:  June 06, 2019 FINDINGS: The lung volumes are low. There is persistent blunting of the costophrenic angles bilaterally. The heart size is stable but mildly enlarged. There is vascular congestion. Stable bibasilar airspace disease is noted and favored to represent atelectasis. There is no pneumothorax. There is no acute osseous abnormality. IMPRESSION: 1. Persistent small bilateral pleural effusions with bibasilar airspace disease, favored to represent atelectasis. 2. Stable cardiac enlargement with vascular congestion. Electronically Signed   By: Constance Holster M.D.   On: 07/01/2020 23:58    EKG: Independently reviewed. Sinus tachycardia (rate 114), non-specific IVCD.    Assessment/Plan   1. SIRS  - Patient is renal transplant recipient on immunosuppressants presenting with fevers and fatigue, and is found to be febrile with tachycardia, tachypnea, leukocytosis, normal lactate, and unclear etiology of fever  - No acute findings on CXR, no urinary sxs, no GI sxs, no meningismus, no cellulitis or wounds  - Blood and urine cultures were collected in ED and she was started on broad-spectrum antibiotics  - Continue empiric antibiotcs, follow cultures and clinical course   2. Renal transplant recipient; CKD IIIa  - Patient has polycystic kidney disease s/p renal transplant; SCr is 1.02 on admission, similar to priors   - Pharmacy medication-reconciliation pending; patient reports continued use of Cellcept, Prograf, and prednisone - will continue    3. COPD; chronic hypoxic respiratory failure  - Patient reported increased SOB and was tachypneic on arrival to ED but denies increase in chronic cough and is not wheezing  - She is saturating upper 90s on her usual 3 Lpm in ED  - Continue breathing treatments, continue supplemental O2   4. Chronic diastolic CHF  - Appears compensated  - Monitor weight and I/Os, follow-up pharmacy medication-reconciliation    5. 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

## 2020-07-02 NOTE — Progress Notes (Addendum)
TR[facility] progress note  This is a 64 year old female with polycystic disease who is status post renal transplant, chronic diastolic heart failure, COPD on 3 L of oxygen at home, question multiple myeloma who presented to the hospital with shortness of breath, fever and lethargy.  Symptoms started the day before presenting to the hospital.   Subjective: She admits to a dry cough and some loose stools.  No dysuria no sore throat.  She is breathing a little fast and quite sleepy.  Closes her eyes after answering each question. Temperature was 100.9 checked rectally, respiratory rate was in the 20s and heart rate was in the 100s in the ED WBC count was 21 and has improved to 17.9 today.  Principal Problem:   SIRS (systemic inflammatory response syndrome)  -I am suspecting her source of infection is respiratory and possibly viral -We will place a continuous pulse oximeter in the room -I have ordered a respiratory virus panel-SARS Covid is negative -Chest x-ray performed in ED was unrevealing-if she becomes hypoxic, may have to repeat x-ray -Increase prednisone to Solu-Medrol to prevent acute adrenal insufficiency  and treat dyspnea -Continue vancomycin, Maxipime and Flagyl -IV fluids as she is mostly sleeping and not eating  Active Problems:    COPD (chronic obstructive pulmonary disease)/chronic respiratory failure - She is still on 3 L of oxygen which is what she uses at home-continue nebs-  - she has a rapid Resp rate but no wheezing  Urinary retention? 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

## 2020-07-02 NOTE — Progress Notes (Signed)
PHARMACY - PHYSICIAN COMMUNICATION CRITICAL VALUE ALERT - BLOOD CULTURE IDENTIFICATION (BCID)  April [name] is an 64 y.o. female with PMH of renal transplant on immunosuppressants who presented to [facility] on 07/01/2020 with a chief complaint of fever, lethargy, SOB.   Assessment: SIRS-per MD assessment, suspect source of infection is respiratory and possibly viral   Name of physician (or Provider) Contacted: Dr. Wynelle Cleveland  Current antibiotics: Vancomycin, Cefepime, Metronidazole  Changes to prescribed antibiotics recommended:  Patient is on recommended antibiotics - No changes needed. Suspect methicillin resistant Staph epi and Strep species in 1/4 bottles contaminants.   Results for orders placed or performed during the hospital encounter of 07/01/20  Blood Culture ID Panel (Reflexed) (Collected: 07/01/2020 11:35 PM)  Result Value Ref Range   Enterococcus faecalis NOT DETECTED NOT DETECTED   Enterococcus Faecium NOT DETECTED NOT DETECTED   Listeria monocytogenes NOT DETECTED NOT DETECTED   Staphylococcus species DETECTED (A) NOT DETECTED   Staphylococcus aureus (BCID) NOT DETECTED NOT DETECTED   Staphylococcus epidermidis DETECTED (A) NOT DETECTED   Staphylococcus lugdunensis NOT DETECTED NOT DETECTED   Streptococcus species DETECTED (A) NOT DETECTED   Streptococcus agalactiae NOT DETECTED NOT DETECTED   Streptococcus pneumoniae NOT DETECTED NOT DETECTED   Streptococcus pyogenes NOT DETECTED NOT DETECTED   A.calcoaceticus-baumannii NOT DETECTED NOT DETECTED   Bacteroides fragilis NOT DETECTED NOT DETECTED   Enterobacterales NOT DETECTED NOT DETECTED   Enterobacter cloacae complex NOT DETECTED NOT DETECTED   Escherichia coli NOT DETECTED NOT DETECTED   Klebsiella aerogenes NOT DETECTED NOT DETECTED   Klebsiella oxytoca NOT DETECTED NOT DETECTED   Klebsiella pneumoniae NOT DETECTED NOT DETECTED   Proteus species NOT DETECTED NOT DETECTED   Salmonella species NOT DETECTED NOT  DETECTED   Serratia marcescens NOT DETECTED NOT DETECTED   Haemophilus influenzae NOT DETECTED NOT DETECTED   Neisseria meningitidis NOT DETECTED NOT DETECTED   Pseudomonas aeruginosa NOT DETECTED NOT DETECTED   Stenotrophomonas maltophilia NOT DETECTED NOT DETECTED   Candida albicans NOT DETECTED NOT DETECTED   Candida auris NOT DETECTED NOT DETECTED   Candida glabrata NOT DETECTED NOT DETECTED   Candida krusei NOT DETECTED NOT DETECTED   Candida parapsilosis NOT DETECTED NOT DETECTED   Candida tropicalis NOT DETECTED NOT DETECTED   Cryptococcus neoformans/gattii NOT DETECTED NOT DETECTED   Methicillin resistance mecA/C DETECTED (A) NOT DETECTED    April [name] 07/02/2020  6:35 PM

## 2020-07-02 NOTE — Progress Notes (Signed)
A consult was received from an ED physician for Vancomycin, cefepime per pharmacy dosing.  The patient's profile has been reviewed for ht/wt/allergies/indication/available labs.   A one time order has been placed for Vancomycin 1.5gm iv x1, and Cefepime 2gm iv x1.  Further antibiotics/pharmacy consults should be ordered by admitting physician if indicated.                       Thank you, [name] 07/02/2020  1:55 AM

## 2020-07-02 NOTE — ED Notes (Signed)
Pt repositioned in bed, gown, brief and chux changed. Rectal temp checked.

## 2020-07-02 NOTE — Progress Notes (Addendum)
Pharmacy Antibiotic Note  April [name] is a 64 y.o. female admitted on 07/01/2020 with sepsis.  Pharmacy has been consulted for Vancomycin, cefepime dosing.  Plan: Vancomycin 1.5gm iv x1, then Vancomycin 1000 mg IV Q 24 hrs. Goal AUC 400-550. Expected AUC: 476 SCr used: 1  Cefepime 2gm iv x1, then 2gm iv q8hr Change to 2gm iv q12hr--renal function remains below 51mL/min  Height: 5\' 1"  (154.9 cm) Weight: 72 kg (158 lb 11.7 oz) IBW/kg (Calculated) : 47.8  Temp (24hrs), Avg:99.7 F (37.6 C), Min:98.6 F (37 C), Max:100.9 F (38.3 C)  Recent Labs  Lab 07/01/20 2334 07/02/20 0000 07/02/20 0030 07/02/20 0140  WBC 21.1*  --   --   --   CREATININE 1.02*  --  1.00  --   LATICACIDVEN  --  0.8  --  0.8    Estimated Creatinine Clearance: 52.3 mL/min (by C-G formula based on SCr of 1 mg/dL).    Allergies  Allergen Reactions  . Infed [Iron Dextran] Other (See Comments)    Chest tightness  . Pentamidine Itching, Shortness Of Breath and Swelling  . Erythromycin [Erythromycin] Other (See Comments)    Mouth Ulcers  . Iohexol Other (See Comments)    Per patient "she has had a kidney transplant and should never have contrast"  . Oxycodone Nausea Only and Nausea And Vomiting  . Erythromycin Rash    Causes breakout in mouth  . Ultram [Tramadol Hcl] Anxiety    Antimicrobials this admission: Vancomycin 07/02/2020 >> Cefepime 07/02/2020 >>   Dose adjustments this admission: -  Microbiology results: -  Thank you for allowing pharmacy to be a part of this patient's care.  [name] 07/02/2020 6:26 AM

## 2020-07-02 NOTE — ED Notes (Signed)
Brief checked, pt dry and purewick in place

## 2020-07-03 LAB — BASIC METABOLIC PANEL
Anion gap: 11 (ref 5–15)
BUN: 42 mg/dL — ABNORMAL HIGH (ref 8–23)
CO2: 20 mmol/L — ABNORMAL LOW (ref 22–32)
Calcium: 9 mg/dL (ref 8.9–10.3)
Chloride: 108 mmol/L (ref 98–111)
Creatinine, Ser: 1.17 mg/dL — ABNORMAL HIGH (ref 0.44–1.00)
GFR calc Af Amer: 57 mL/min — ABNORMAL LOW (ref >60)
GFR calc non Af Amer: 50 mL/min — ABNORMAL LOW (ref >60)
Glucose, Bld: 187 mg/dL — ABNORMAL HIGH (ref 70–99)
Potassium: 5.1 mmol/L (ref 3.5–5.1)
Sodium: 139 mmol/L (ref 135–145)

## 2020-07-03 LAB — URINE CULTURE: Culture: NO GROWTH

## 2020-07-03 LAB — CBC
HCT: 31.4 % — ABNORMAL LOW (ref 36.0–46.0)
Hemoglobin: 9.2 g/dL — ABNORMAL LOW (ref 12.0–15.0)
MCH: 30.9 pg (ref 26.0–34.0)
MCHC: 29.3 g/dL — ABNORMAL LOW (ref 30.0–36.0)
MCV: 105.4 fL — ABNORMAL HIGH (ref 80.0–100.0)
Platelets: 191 10*3/uL (ref 150–400)
RBC: 2.98 MIL/uL — ABNORMAL LOW (ref 3.87–5.11)
RDW: 19.3 % — ABNORMAL HIGH (ref 11.5–15.5)
WBC: 14.1 10*3/uL — ABNORMAL HIGH (ref 4.0–10.5)
nRBC: 0 % (ref 0.0–0.2)

## 2020-07-03 LAB — FUNGUS CULTURE RESULT

## 2020-07-03 LAB — FUNGUS CULTURE WITH STAIN

## 2020-07-03 LAB — FUNGAL ORGANISM REFLEX

## 2020-07-03 MED ORDER — SODIUM CHLORIDE 0.9 % IV SOLN
2.0000 g | Freq: Two times a day (BID) | INTRAVENOUS | Status: DC
  Administered 2020-07-03: 2 g via INTRAVENOUS
  Filled 2020-07-03 (×2): qty 2

## 2020-07-03 MED ORDER — ENSURE ENLIVE PO LIQD
237.0000 mL | Freq: Three times a day (TID) | ORAL | Status: DC
  Administered 2020-07-03: 237 mL via ORAL

## 2020-07-03 MED ORDER — ADULT MULTIVITAMIN W/MINERALS CH: 1.0000 | ORAL_TABLET | Freq: Every day | ORAL | Status: DC

## 2020-07-03 NOTE — TOC Transition Note (Signed)
Transition of Care [facility]) - CM/SW Discharge Note   Patient Details  Name: April [name] MRN: [id number] Date of Birth: 08/19/1956  Transition of Care [facility]) CM/SW Contact:  [name] Ludwig, LCSW Phone Number: 07/03/2020, 3:48 PM   Clinical Narrative:    Patient is a 64 year old female who is alert and oriented x4.  Patient is active with [facility] home health for PT, OT, and RN.  Patient plans to continue with home health agency.  CSW spoke to [place] at [place] and they are able to continue to provide services.  Patient will be going home with home health through [facility].  CSW signing off please reconsult with any other social work needs, home health agency has been notified of planned discharge.    Final next level of care: [place] Barriers to Discharge: Barriers Resolved   Patient Goals and CMS Choice Patient states their goals for this hospitalization and ongoing recovery are:: To return back home with home health. CMS Medicare.gov Compare Post Acute Care list provided to:: Patient Choice offered to / list presented to : Patient  Discharge Placement                       Discharge Plan and Services                  DME Agency: NA       HH Arranged: PT, OT, RN [facility] Agency: [facility] Date [facility] Agency Contacted: 07/03/20 Time [place]: 9977 Representative spoke with at [place]: [place] ([place]) Interventions     Readmission Risk Interventions Readmission Risk Prevention Plan 05/15/2020 04/19/2020 02/09/2020  Transportation Screening Complete Complete Complete  PCP or Specialist Appt within 3-5 Days - - -  Not Complete comments - - -  HRI or [place] or Home Care Consult comments - - -  Social Work Consult for [place] Planning/Counseling - - -  SW consult not completed comments - - -  Palliative Care Screening - - -  Medication Review ([place]) Referral  to Pharmacy Complete Referral to Pharmacy  PCP or Specialist appointment within 3-5 days of discharge Complete Complete Complete  HRI or Home Care Consult Complete Complete Complete  SW Recovery Care/Counseling Consult Complete Complete -  Palliative Care Screening Not Applicable Not Applicable Complete  Skilled Nursing Facility Complete Complete Patient Refused  Some recent data might be hidden

## 2020-07-03 NOTE — Progress Notes (Signed)
Chaplain provided support around Advance directives.    Provided education, space for reflection on desires for HCPOA, Living Will.  Pt states she would like to name her daughter Chauncey Reading as well as her spouse, as daughter is OT and is knowledgeable.  She would like to take time to reflect on living will questions.  If she discharges this evening or tomorrow morning, she understands how to have AD notarized and to return copy to medical records.  Has contact info for this chaplain for assistance in notarizing document.

## 2020-07-03 NOTE — Discharge Summary (Signed)
Physician Discharge Summary  SHALAINA GUARDIOLA WIO:[id number] DOB: 1956-09-25 DOA: 07/01/2020  PCP: [name], MD  Admit date: 07/01/2020 Discharge date: 07/03/2020  Admitted From: Home Disposition: Home  Recommendations for Outpatient Follow-up:  1. Follow up with PCP in 1-2 weeks 2. Please obtain BMP/CBC in one week  Discharge Condition: Stable CODE STATUS: Full Diet recommendation: As tolerated  Brief/Interim Summary: April [name] is a 64 y.o. 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  Patient reports that she been in her usual state of health until yesterday when she developed lethargy, chills, and increased dyspnea.  She reports being febrile and took some Tylenol at home.  She continued to have chills and lethargy, and ultimately came into the ED.  She denies any abdominal pain, flank pain, or dysuria.  She denies any neck stiffness or headache.  She denies any rashes or wounds.  She reports feeling more dyspneic than usual but denies change in her chronic cough or sputum production. Upon arrival to the ED, patient is found to be 38.3 C, saturating upper 90s on 3 L/min of supplemental oxygen, tachypneic, tachycardic, and with stable blood pressure.  EKG features sinus tachycardia with rate 114 and nonspecific IVCD.  Chest x-ray with stable cardiomegaly and vascular congestion, persistent small bilateral pleural effusions, and bibasilar airspace disease that likely reflects atelectasis.  Chemistry panel is notable for creatinine 1.02, similar to priors.  CBC features a leukocytosis to 21,100 and a chronic anemia with hemoglobin 9.7.  Covid  antigen and PCR tests are both negative.  Lactic acid is reassuringly normal.  BNP is elevated 558.  Blood and urine cultures were collected and the patient was treated with IV fluids, vancomycin, cefepime, and Flagyl.  Patient admitted as above with generalized malaise questionable fevers at home.  She indicates extremely poor p.o. intake over the past week prior to admission, she was admitted given IV fluids and started on broad-spectrum antibiotics without clear source.  Since admission patient's oxygen has been back to baseline at 3 L, she remains afebrile and otherwise feels "fantastic" compared to how she did at admission and is requesting discharge home.  At this time given patient's multiple recurrent hospitalizations with negative findings negative imaging negative microbiology and otherwise feels quite well patient is stable for discharge home with close follow-up with PCP.  Patient did have contaminated blood cultures x2, viral panel negative for any acute illnesses, urine culture shows no growth, and patient remains without any overt symptoms.  At this time given her chronic conditions including polycystic kidney disease with renal transplant diastolic dysfunction COPD and likely ongoing multiple myeloma we felt her symptoms were most closely related to poor p.o. intake and fatigue with protein malnourishment.  Recommending increasing diet, protein supplementation with shakes as well as increased p.o. intake of free water.  Close follow-up with PCP in the next 48 to 72 hours as discussed.  Discharge Diagnoses:  Principal Problem:   SIRS (systemic inflammatory response syndrome) (HCC) Active Problems:   Renal transplant recipient   Chronic diastolic CHF (congestive heart failure) (HCC)   Essential hypertension   COPD (chronic obstructive pulmonary disease) (HCC)   Chronic respiratory failure with hypoxia (HCC)   Immunocompromised state ([facility])   Chronic  kidney disease, stage  3a    Discharge Instructions  Discharge Instructions    Call MD for:  difficulty breathing, headache or visual disturbances   Complete by: As directed    Call MD for:  extreme fatigue   Complete by: As directed    Call MD for:  persistant dizziness or light-headedness   Complete by: As directed    Call MD for:  temperature >100.4   Complete by: As directed    Diet - low sodium heart healthy   Complete by: As directed    Increase activity slowly   Complete by: As directed    No wound care   Complete by: As directed      Allergies as of 07/03/2020      Reactions   Infed [iron Dextran] Other (See Comments)   Chest tightness   Pentamidine Itching, Shortness Of Breath, Swelling   Erythromycin [erythromycin] Other (See Comments)   Mouth Ulcers   Iohexol Other (See Comments)   Per patient "she has had a kidney transplant and should never have contrast"   Oxycodone Nausea Only, Nausea And Vomiting   Erythromycin Rash   Causes breakout in mouth   Ultram [tramadol Hcl] Anxiety      Medication List    STOP taking these medications   guaiFENesin 600 MG 12 hr tablet Commonly known as: MUCINEX     TAKE these medications   acetaminophen 325 MG tablet Commonly known as: TYLENOL Take 2 tablets (650 mg total) by mouth every 6 (six) hours as needed for mild pain (or Fever >/= 101).   albuterol 108 (90 Base) MCG/ACT inhaler Commonly known as: VENTOLIN HFA Inhale 2 puffs into the lungs every 4 (four) hours as needed for wheezing or shortness of breath.   allopurinol 300 MG tablet Commonly known as: ZYLOPRIM Take 1 tablet (300 mg total) by mouth daily.   ascorbic acid 500 MG tablet Commonly known as: VITAMIN C Take 500 mg by mouth daily.   aspirin 81 MG chewable tablet Chew 81 mg by mouth daily.   brimonidine 0.1 % Soln Commonly known as: ALPHAGAN P Place 1 drop into both eyes 2 (two) times daily.   budesonide 1 MG/2ML nebulizer solution Commonly known as:  PULMICORT Take 1 mg by nebulization in the morning and at bedtime.   cholecalciferol 25 MCG (1000 UNIT) tablet Commonly known as: VITAMIN D3 Take 1 tablet (1,000 Units total) by mouth daily.   collagenase ointment Commonly known as: SANTYL Apply 1 application topically daily. Sacral wound   cyanocobalamin 1000 MCG tablet Take 1,000 mcg by mouth daily.   cycloSPORINE 0.05 % ophthalmic emulsion Commonly known as: RESTASIS Place 1 drop into both eyes 2 (two) times daily.   dorzolamide 2 % ophthalmic solution Commonly known as: TRUSOPT Place 1 drop into both eyes daily.   ferrous sulfate 325 (65 FE) MG tablet Take 325 mg by mouth daily with breakfast.   FLUoxetine 40 MG capsule Commonly known as: PROZAC Take 1 capsule (40 mg total) by mouth daily.   furosemide 40 MG tablet Commonly known as: LASIX Take 1 tablet (40 mg total) by mouth daily.   gabapentin 100 MG capsule Commonly known as: NEURONTIN Take 200 mg by mouth 3 (three) times daily.   ipratropium 17 MCG/ACT inhaler Commonly known as: ATROVENT HFA Inhale 1 puff into the lungs every 6 (six) hours. For COPD   isosorbide mononitrate 60 MG 24 hr tablet Commonly known as: IMDUR Take 1 tablet (60  mg total) by mouth daily.   levalbuterol 0.63 MG/3ML nebulizer solution Commonly known as: XOPENEX Take 0.63 mg by nebulization See admin instructions. Inhale 0.63m once daily.  May take 0.646mevery six hours as needed COPD.   magnesium oxide 400 MG tablet Commonly known as: MAG-OX Take 1 tablet (400 mg total) by mouth 2 (two) times daily.   multivitamin with minerals Tabs tablet Take 1 tablet by mouth daily.   mycophenolate 180 MG EC tablet Commonly known as: MYFORTIC Take 180 mg by mouth 2 (two) times daily.   pantoprazole 40 MG tablet Commonly known as: PROTONIX Take 1 tablet (40 mg total) by mouth 2 (two) times daily.   predniSONE 5 MG tablet Commonly known as: DELTASONE Take 7.5 mg by mouth daily with  breakfast. What changed: Another medication with the same name was removed. Continue taking this medication, and follow the directions you see here.   Prograf 1 MG capsule Generic drug: tacrolimus Take 1-2 mg by mouth daily. 65m10mn the morning 1mg365m the afternoon   sennosides-docusate sodium 8.6-50 MG tablet Commonly known as: SENOKOT-S Take 1 tablet by mouth daily.   Travatan Z 0.004 % Soln ophthalmic solution Generic drug: Travoprost (BAK Free) Place 1 drop into both eyes at bedtime.   valACYclovir 500 MG tablet Commonly known as: VALTREX Take 500 mg by mouth 2 (two) times daily.   verapamil 240 MG CR tablet Commonly known as: CALAN-SR Take 240 mg by mouth daily.       Allergies  Allergen Reactions  . Infed [Iron Dextran] Other (See Comments)    Chest tightness  . Pentamidine Itching, Shortness Of Breath and Swelling  . Erythromycin [Erythromycin] Other (See Comments)    Mouth Ulcers  . Iohexol Other (See Comments)    Per patient "she has had a kidney transplant and should never have contrast"  . Oxycodone Nausea Only and Nausea And Vomiting  . Erythromycin Rash    Causes breakout in mouth  . Ultram [Tramadol Hcl] Anxiety    Consultations:  None   Procedures/Studies: DG Chest 2 View  Result Date: 06/05/2020 CLINICAL DATA:  Chest tightness for several days EXAM: CHEST - 2 VIEW COMPARISON:  05/27/2020 FINDINGS: Frontal and lateral views of the chest demonstrate an unremarkable cardiac silhouette. Lung volumes are diminished with scattered areas of bibasilar atelectasis. Trace bilateral pleural effusions are noted. No pneumothorax. IMPRESSION: 1. Trace bilateral pleural effusions. 2. Hypoventilatory changes.  No acute airspace disease. Electronically Signed   By: MichRanda Ngo.   On: 06/05/2020 18:24   NM Pulmonary Perfusion  Result Date: 06/05/2020 CLINICAL DATA:  Hypoxemia, chest tightness for several days EXAM: NUCLEAR MEDICINE PERFUSION LUNG SCAN  TECHNIQUE: Perfusion images were obtained in multiple projections after intravenous injection of radiopharmaceutical. Ventilation scans intentionally deferred if perfusion scan and chest x-ray adequate for interpretation during COVID 19 epidemic. RADIOPHARMACEUTICALS:  3.9 mCi Tc-51m 70mIV COMPARISON:  06/05/2020 FINDINGS: Planar images of the chest are obtained during performance of the perfusion exam. There are no perfusion defects. Normal symmetrical radiotracer distribution throughout the lungs. IMPRESSION: 1. Normal perfusion exam.  No evidence of pulmonary embolus. Electronically Signed   By: MichaRanda Ngo   On: 06/05/2020 18:42   DG Chest Port 1 View  Result Date: 07/01/2020 CLINICAL DATA:  Worsening shortness of breath. EXAM: PORTABLE CHEST 1 VIEW COMPARISON:  June 06, 2019 FINDINGS: The lung volumes are low. There is persistent blunting of the costophrenic angles bilaterally. The heart size  is stable but mildly enlarged. There is vascular congestion. Stable bibasilar airspace disease is noted and favored to represent atelectasis. There is no pneumothorax. There is no acute osseous abnormality. IMPRESSION: 1. Persistent small bilateral pleural effusions with bibasilar airspace disease, favored to represent atelectasis. 2. Stable cardiac enlargement with vascular congestion. Electronically Signed   By: Constance Holster M.D.   On: 07/01/2020 23:58     Subjective: No acute issues or events overnight, feels back to baseline, denies chest pain, shortness of breath, nausea, vomiting, diarrhea, constipation, headache, fevers, chills.   Discharge Exam: Vitals:   07/03/20 1418 07/03/20 1509  BP: 133/66   Pulse: 91   Resp: (!) 22   Temp: 97.8 F (36.6 C)   SpO2: 97% 94%   Vitals:   07/03/20 0411 07/03/20 0910 07/03/20 1418 07/03/20 1509  BP: 125/77  133/66   Pulse: 84  91   Resp: (!) 21  (!) 22   Temp: 98.2 F (36.8 C)  97.8 F (36.6 C)   TempSrc:   Oral   SpO2: 100% 100% 97% 94%   Weight:      Height:        General: Pt is alert, awake, not in acute distress Cardiovascular: RRR, S1/S2 +, no rubs, no gallops Respiratory: CTA bilaterally, no wheezing, no rhonchi Abdominal: Soft, NT, ND, bowel sounds + Extremities: no edema, no cyanosis    The results of significant diagnostics from this hospitalization (including imaging, microbiology, ancillary and laboratory) are listed below for reference.     Microbiology: Recent Results (from the past 240 hour(s))  Blood Culture (routine x 2)     Status: Abnormal (Preliminary result)   Collection Time: 07/01/20 11:35 PM   Specimen: BLOOD  Result Value Ref Range Status   Specimen Description   Final    BLOOD BLOOD RIGHT FOREARM Performed at [facility] [address], [place], [place] [address]    Special Requests   Final    BOTTLES DRAWN AEROBIC AND ANAEROBIC Blood Culture adequate volume Performed at [facility] [address], [place],  [address]    Culture  Setup Time   Final    GRAM POSITIVE COCCI IN CHAINS ANAEROBIC BOTTLE ONLY Organism ID to follow CRITICAL RESULT CALLED TO, READ BACK BY AND VERIFIED WITH: N,GLOGOVAC '@1812'$  07/02/20 EB Performed at [facility] [address], [place],  [address]    Culture VIRIDANS STREPTOCOCCUS (A)  Final   Report Status PENDING  Incomplete  Blood Culture ID Panel (Reflexed)     Status: Abnormal   Collection Time: 07/01/20 11:35 PM  Result Value Ref Range Status   Enterococcus faecalis NOT DETECTED NOT DETECTED Final   Enterococcus Faecium NOT DETECTED NOT DETECTED Final   Listeria monocytogenes NOT DETECTED NOT DETECTED Final   Staphylococcus species DETECTED (A) NOT DETECTED Final    Comment: RESULT CALLED TO, READ BACK BY AND VERIFIED WITH: N,GLOGOVAC '@1812'$  07/02/20 EB    Staphylococcus aureus (BCID) NOT DETECTED NOT DETECTED Final   Staphylococcus epidermidis DETECTED (A) NOT DETECTED Final     Comment: Methicillin (oxacillin) resistant coagulase negative staphylococcus. Possible blood culture contaminant (unless isolated from more than one blood culture draw or clinical case suggests pathogenicity). No antibiotic treatment is indicated for blood  culture contaminants. N,GLOGOVAC '@1812'$  07/02/20 EB    Staphylococcus lugdunensis NOT DETECTED NOT DETECTED Final   Streptococcus species DETECTED (A) NOT DETECTED Final    Comment: Not Enterococcus species, Streptococcus agalactiae,  Streptococcus pyogenes, or Streptococcus pneumoniae. RESULT CALLED TO, READ BACK BY AND VERIFIED WITH: N,GLOGOVAC _0  07/02/20 EB    Streptococcus agalactiae NOT DETECTED NOT DETECTED Final   Streptococcus pneumoniae NOT DETECTED NOT DETECTED Final   Streptococcus pyogenes NOT DETECTED NOT DETECTED Final   A.calcoaceticus-baumannii NOT DETECTED NOT DETECTED Final   Bacteroides fragilis NOT DETECTED NOT DETECTED Final   Enterobacterales NOT DETECTED NOT DETECTED Final   Enterobacter cloacae complex NOT DETECTED NOT DETECTED Final   Escherichia coli NOT DETECTED NOT DETECTED Final   Klebsiella aerogenes NOT DETECTED NOT DETECTED Final   Klebsiella oxytoca NOT DETECTED NOT DETECTED Final   Klebsiella pneumoniae NOT DETECTED NOT DETECTED Final   Proteus species NOT DETECTED NOT DETECTED Final   Salmonella species NOT DETECTED NOT DETECTED Final   Serratia marcescens NOT DETECTED NOT DETECTED Final   Haemophilus influenzae NOT DETECTED NOT DETECTED Final   Neisseria meningitidis NOT DETECTED NOT DETECTED Final   Pseudomonas aeruginosa NOT DETECTED NOT DETECTED Final   Stenotrophomonas maltophilia NOT DETECTED NOT DETECTED Final   Candida albicans NOT DETECTED NOT DETECTED Final   Candida auris NOT DETECTED NOT DETECTED Final   Candida glabrata NOT DETECTED NOT DETECTED Final   Candida krusei NOT DETECTED NOT DETECTED Final   Candida parapsilosis NOT DETECTED NOT DETECTED Final   Candida tropicalis NOT  DETECTED NOT DETECTED Final   Cryptococcus neoformans/gattii NOT DETECTED NOT DETECTED Final   Methicillin resistance mecA/C DETECTED (A) NOT DETECTED Final    Comment: RESULT CALLED TO, READ BACK BY AND VERIFIED WITH: N,GLOGOVAC _1  07/02/20 EB Performed at [facility], [address] [place], [place] [address]   Blood Culture (routine x 2)     Status: None (Preliminary result)   Collection Time: 07/01/20 11:40 PM   Specimen: BLOOD  Result Value Ref Range Status   Specimen Description   Final    BLOOD RIGHT HAND Performed at [facility] [address], [place], [place] [address]    Special Requests   Final    BOTTLES DRAWN AEROBIC AND ANAEROBIC Blood Culture adequate volume Performed at [facility] [address], [place], [place] [address]    Culture  Setup Time   Final    AEROBIC BOTTLE ONLY GRAM POSITIVE COCCI CRITICAL VALUE NOTED.  VALUE IS CONSISTENT WITH PREVIOUSLY REPORTED AND CALLED VALUE. Performed at [facility], [place] [address], [place], [place] [address]    Culture GRAM POSITIVE COCCI  Final   Report Status PENDING  Incomplete  SARS Coronavirus 2 by RT PCR (hospital order, performed in [facility] hospital lab) Nasopharyngeal Nasopharyngeal Swab     Status: None   Collection Time: 07/02/20  3:00 AM   Specimen: Nasopharyngeal Swab  Result Value Ref Range Status   SARS Coronavirus 2 NEGATIVE NEGATIVE Final    Comment: (NOTE) SARS-CoV-2 target nucleic acids are NOT DETECTED.  The SARS-CoV-2 RNA is generally detectable in upper and lower respiratory specimens during the acute phase of infection. The lowest concentration of SARS-CoV-2 viral copies this assay can detect is 250 copies / mL. A negative result does not preclude SARS-CoV-2 infection and should not be used as the sole basis for treatment or other patient management decisions.  A negative result may occur with improper specimen collection / handling,  submission of specimen other than nasopharyngeal swab, presence of viral mutation(s) within the areas targeted by this assay, and inadequate number of viral copies (<250 copies / mL). A  negative result must be combined with clinical observations, patient history, and epidemiological information.  Fact Sheet for Patients:   StrictlyIdeas.no  Fact Sheet for Healthcare Providers: BankingDealers.co.za  This test is not yet approved or  cleared by the [place] FDA and has been authorized for detection and/or diagnosis of SARS-CoV-2 by FDA under an Emergency Use Authorization (EUA).  This EUA will remain in effect (meaning this test can be used) for the duration of the COVID-19 declaration under Section 564(b)(1) of the Act, 21 U.S.C. section 360bbb-3(b)(1), unless the authorization is terminated or revoked sooner.  Performed at [facility], [place] [address] [place], [place] [address]   Urine culture     Status: None   Collection Time: 07/02/20  3:15 AM   Specimen: In/Out Cath Urine  Result Value Ref Range Status   Specimen Description   Final    IN/OUT CATH URINE Performed at [facility] [address], [place], [place] [address]    Special Requests   Final    NONE Performed at [facility], [place] [address], [place], [place] [address]    Culture   Final    NO GROWTH Performed at [facility], [place] [address], [place], [place] [address]    Report Status 07/03/2020 FINAL  Final  Respiratory Panel by PCR     Status: None   Collection Time: 07/02/20 11:55 AM   Specimen: Nasopharyngeal Swab; Respiratory  Result Value Ref Range Status   Adenovirus NOT DETECTED NOT DETECTED Final   Coronavirus 229E NOT DETECTED NOT DETECTED Final    Comment: (NOTE) The Coronavirus on the Respiratory Panel, DOES NOT test for the novel  Coronavirus (2019 nCoV)    Coronavirus HKU1 NOT  DETECTED NOT DETECTED Final   Coronavirus NL63 NOT DETECTED NOT DETECTED Final   Coronavirus OC43 NOT DETECTED NOT DETECTED Final   Metapneumovirus NOT DETECTED NOT DETECTED Final   Rhinovirus / Enterovirus NOT DETECTED NOT DETECTED Final   Influenza A NOT DETECTED NOT DETECTED Final   Influenza B NOT DETECTED NOT DETECTED Final   Parainfluenza Virus 1 NOT DETECTED NOT DETECTED Final   Parainfluenza Virus 2 NOT DETECTED NOT DETECTED Final   Parainfluenza Virus 3 NOT DETECTED NOT DETECTED Final   Parainfluenza Virus 4 NOT DETECTED NOT DETECTED Final   Respiratory Syncytial Virus NOT DETECTED NOT DETECTED Final   Bordetella pertussis NOT DETECTED NOT DETECTED Final   Chlamydophila pneumoniae NOT DETECTED NOT DETECTED Final   Mycoplasma pneumoniae NOT DETECTED NOT DETECTED Final    Comment: Performed at [facility], [place]. [address] [place], [place] [address]     Labs: BNP (last 3 results) Recent Labs    05/20/20 0316 05/27/20 1020 07/01/20 2334  BNP 167.5* 183.3* 527.7*   Basic Metabolic Panel: Recent Labs  Lab 07/01/20 2334 07/02/20 0030 07/03/20 0350  NA 135 138 139  K 4.4 4.5 5.1  CL 102 103 108  CO2 25  --  20*  GLUCOSE 135* 132* 187*  BUN 28* 27* 42*  CREATININE 1.02* 1.00 1.17*  CALCIUM 9.7  --  9.0   Liver Function Tests: Recent Labs  Lab 07/01/20 2334  AST 19  ALT 14  ALKPHOS 81  BILITOT 1.1  PROT 7.6  ALBUMIN 3.1*   No results for input(s): LIPASE, AMYLASE in the last 168 hours. No results for input(s): AMMONIA in the last 168 hours. CBC: Recent Labs  Lab 07/01/20 2334 07/02/20 0030  07/02/20 1006 07/03/20 0350  WBC 21.1*  --  17.9* 14.1*  NEUTROABS 19.1*  --   --   --   HGB 9.7* 10.5* 9.3* 9.2*  HCT 32.1* 31.0* 32.0* 31.4*  MCV 102.9*  --  104.9* 105.4*  PLT 233  --  204 191   Cardiac Enzymes: No results for input(s): CKTOTAL, CKMB, CKMBINDEX, TROPONINI in the last 168 hours. BNP: Invalid input(s): POCBNP CBG: No results for  input(s): GLUCAP in the last 168 hours. D-Dimer No results for input(s): DDIMER in the last 72 hours. Hgb A1c No results for input(s): HGBA1C in the last 72 hours. Lipid Profile No results for input(s): CHOL, HDL, LDLCALC, TRIG, CHOLHDL, LDLDIRECT in the last 72 hours. Thyroid function studies No results for input(s): TSH, T4TOTAL, T3FREE, THYROIDAB in the last 72 hours.  Invalid input(s): FREET3 Anemia work up No results for input(s): VITAMINB12, FOLATE, FERRITIN, TIBC, IRON, RETICCTPCT in the last 72 hours. Urinalysis    Component Value Date/Time   COLORURINE YELLOW 07/02/2020 0315   APPEARANCEUR CLEAR 07/02/2020 0315   LABSPEC 1.016 07/02/2020 0315   PHURINE 5.0 07/02/2020 0315   GLUCOSEU NEGATIVE 07/02/2020 0315   HGBUR SMALL (A) 07/02/2020 0315   BILIRUBINUR NEGATIVE 07/02/2020 0315   KETONESUR 5 (A) 07/02/2020 0315   PROTEINUR 100 (A) 07/02/2020 0315   UROBILINOGEN 0.2 05/15/2013 0733   NITRITE NEGATIVE 07/02/2020 0315   LEUKOCYTESUR NEGATIVE 07/02/2020 0315   Sepsis Labs Invalid input(s): PROCALCITONIN,  WBC,  LACTICIDVEN Microbiology Recent Results (from the past 240 hour(s))  Blood Culture (routine x 2)     Status: Abnormal (Preliminary result)   Collection Time: 07/01/20 11:35 PM   Specimen: BLOOD  Result Value Ref Range Status   Specimen Description   Final    BLOOD BLOOD RIGHT FOREARM Performed at [facility], [place] [address] [place], [place] [address]    Special Requests   Final    BOTTLES DRAWN AEROBIC AND ANAEROBIC Blood Culture adequate volume Performed at [facility] [address], [place], [place] [address]    Culture  Setup Time   Final    GRAM POSITIVE COCCI IN CHAINS ANAEROBIC BOTTLE ONLY Organism ID to follow CRITICAL RESULT CALLED TO, READ BACK BY AND VERIFIED WITH: N,GLOGOVAC _0  07/02/20 EB Performed at [facility] [address] [place], [place] [address]    Culture VIRIDANS STREPTOCOCCUS  (A)  Final   Report Status PENDING  Incomplete  Blood Culture ID Panel (Reflexed)     Status: Abnormal   Collection Time: 07/01/20 11:35 PM  Result Value Ref Range Status   Enterococcus faecalis NOT DETECTED NOT DETECTED Final   Enterococcus Faecium NOT DETECTED NOT DETECTED Final   Listeria monocytogenes NOT DETECTED NOT DETECTED Final   Staphylococcus species DETECTED (A) NOT DETECTED Final    Comment: RESULT CALLED TO, READ BACK BY AND VERIFIED WITH: N,GLOGOVAC _1  07/02/20 EB    Staphylococcus aureus (BCID) NOT DETECTED NOT DETECTED Final   Staphylococcus epidermidis DETECTED (A) NOT DETECTED Final    Comment: Methicillin (oxacillin) resistant coagulase negative staphylococcus. Possible blood culture contaminant (unless isolated from more than one blood culture draw or clinical case suggests pathogenicity). No antibiotic treatment is indicated for blood  culture contaminants. N,GLOGOVAC _2  07/02/20 EB    Staphylococcus lugdunensis NOT DETECTED NOT DETECTED Final   Streptococcus species DETECTED (A) NOT DETECTED Final    Comment: Not Enterococcus species, Streptococcus agalactiae, Streptococcus pyogenes, or Streptococcus pneumoniae. RESULT CALLED TO,  READ BACK BY AND VERIFIED WITH: N,GLOGOVAC _0  07/02/20 EB    Streptococcus agalactiae NOT DETECTED NOT DETECTED Final   Streptococcus pneumoniae NOT DETECTED NOT DETECTED Final   Streptococcus pyogenes NOT DETECTED NOT DETECTED Final   A.calcoaceticus-baumannii NOT DETECTED NOT DETECTED Final   Bacteroides fragilis NOT DETECTED NOT DETECTED Final   Enterobacterales NOT DETECTED NOT DETECTED Final   Enterobacter cloacae complex NOT DETECTED NOT DETECTED Final   Escherichia coli NOT DETECTED NOT DETECTED Final   Klebsiella aerogenes NOT DETECTED NOT DETECTED Final   Klebsiella oxytoca NOT DETECTED NOT DETECTED Final   Klebsiella pneumoniae NOT DETECTED NOT DETECTED Final   Proteus species NOT DETECTED NOT DETECTED Final    Salmonella species NOT DETECTED NOT DETECTED Final   Serratia marcescens NOT DETECTED NOT DETECTED Final   Haemophilus influenzae NOT DETECTED NOT DETECTED Final   Neisseria meningitidis NOT DETECTED NOT DETECTED Final   Pseudomonas aeruginosa NOT DETECTED NOT DETECTED Final   Stenotrophomonas maltophilia NOT DETECTED NOT DETECTED Final   Candida albicans NOT DETECTED NOT DETECTED Final   Candida auris NOT DETECTED NOT DETECTED Final   Candida glabrata NOT DETECTED NOT DETECTED Final   Candida krusei NOT DETECTED NOT DETECTED Final   Candida parapsilosis NOT DETECTED NOT DETECTED Final   Candida tropicalis NOT DETECTED NOT DETECTED Final   Cryptococcus neoformans/gattii NOT DETECTED NOT DETECTED Final   Methicillin resistance mecA/C DETECTED (A) NOT DETECTED Final    Comment: RESULT CALLED TO, READ BACK BY AND VERIFIED WITH: N,GLOGOVAC _1  07/02/20 EB Performed at [facility], [address], [place], [place] [address]   Blood Culture (routine x 2)     Status: None (Preliminary result)   Collection Time: 07/01/20 11:40 PM   Specimen: BLOOD  Result Value Ref Range Status   Specimen Description   Final    BLOOD RIGHT HAND Performed at [place] [address], [place], [place] [address]    Special Requests   Final    BOTTLES DRAWN AEROBIC AND ANAEROBIC Blood Culture adequate volume Performed at [place] [address] [place], [place] [address]    Culture  Setup Time   Final    AEROBIC BOTTLE ONLY GRAM POSITIVE COCCI CRITICAL VALUE NOTED.  VALUE IS CONSISTENT WITH PREVIOUSLY REPORTED AND CALLED VALUE. Performed at [facility], [place] [address] [place], [place] [address]    Culture GRAM POSITIVE COCCI  Final   Report Status PENDING  Incomplete  SARS Coronavirus 2 by RT PCR (hospital order, performed in [facility] hospital lab) Nasopharyngeal Nasopharyngeal Swab     Status: None   Collection Time: 07/02/20  3:00 AM    Specimen: Nasopharyngeal Swab  Result Value Ref Range Status   SARS Coronavirus 2 NEGATIVE NEGATIVE Final    Comment: (NOTE) SARS-CoV-2 target nucleic acids are NOT DETECTED.  The SARS-CoV-2 RNA is generally detectable in upper and lower respiratory specimens during the acute phase of infection. The lowest concentration of SARS-CoV-2 viral copies this assay can detect is 250 copies / mL. A negative result does not preclude SARS-CoV-2 infection and should not be used as the sole basis for treatment or other patient management decisions.  A negative result may occur with improper specimen collection / handling, submission of specimen other than nasopharyngeal swab, presence of viral mutation(s) within the areas targeted by this assay, and inadequate number of viral copies (<250 copies / mL). A negative result must be combined with clinical observations,  patient history, and epidemiological information.  Fact Sheet for Patients:   StrictlyIdeas.no  Fact Sheet for Healthcare Providers: BankingDealers.co.za  This test is not yet approved or  cleared by the [place] FDA and has been authorized for detection and/or diagnosis of SARS-CoV-2 by FDA under an Emergency Use Authorization (EUA).  This EUA will remain in effect (meaning this test can be used) for the duration of the COVID-19 declaration under Section 564(b)(1) of the Act, 21 U.S.C. section 360bbb-3(b)(1), unless the authorization is terminated or revoked sooner.  Performed at [facility], [place] [address] [place], [place] [address]   Urine culture     Status: None   Collection Time: 07/02/20  3:15 AM   Specimen: In/Out Cath Urine  Result Value Ref Range Status   Specimen Description   Final    IN/OUT CATH URINE Performed at [place] [address], [place], [place] [address]    Special Requests   Final    NONE Performed at  [facility], [place] [address] [place], [place] [address]    Culture   Final    NO GROWTH Performed at [facility], [place] [address], [place], [place] [address]    Report Status 07/03/2020 FINAL  Final  Respiratory Panel by PCR     Status: None   Collection Time: 07/02/20 11:55 AM   Specimen: Nasopharyngeal Swab; Respiratory  Result Value Ref Range Status   Adenovirus NOT DETECTED NOT DETECTED Final   Coronavirus 229E NOT DETECTED NOT DETECTED Final    Comment: (NOTE) The Coronavirus on the Respiratory Panel, DOES NOT test for the novel  Coronavirus (2019 nCoV)    Coronavirus HKU1 NOT DETECTED NOT DETECTED Final   Coronavirus NL63 NOT DETECTED NOT DETECTED Final   Coronavirus OC43 NOT DETECTED NOT DETECTED Final   Metapneumovirus NOT DETECTED NOT DETECTED Final   Rhinovirus / Enterovirus NOT DETECTED NOT DETECTED Final   Influenza A NOT DETECTED NOT DETECTED Final   Influenza B NOT DETECTED NOT DETECTED Final   Parainfluenza Virus 1 NOT DETECTED NOT DETECTED Final   Parainfluenza Virus 2 NOT DETECTED NOT DETECTED Final   Parainfluenza Virus 3 NOT DETECTED NOT DETECTED Final   Parainfluenza Virus 4 NOT DETECTED NOT DETECTED Final   Respiratory Syncytial Virus NOT DETECTED NOT DETECTED Final   Bordetella pertussis NOT DETECTED NOT DETECTED Final   Chlamydophila pneumoniae NOT DETECTED NOT DETECTED Final   Mycoplasma pneumoniae NOT DETECTED NOT DETECTED Final    Comment: Performed at [facility], [place], [place], [place] [address]     Time coordinating discharge: Over 30 minutes  SIGNED:   Little Ishikawa, DO [facility] 07/03/2020, 3:19 PM Pager   If 7PM-7AM, please contact night-coverage [email or web address]

## 2020-07-03 NOTE — Progress Notes (Signed)
Initial Nutrition Assessment  RD working remotely.  DOCUMENTATION CODES:   Not applicable  INTERVNTION:   - Ensure Enlive po TID, each supplement provides 350 kcal and 20 grams of protein  - MVI with minerals daily  NUTRITION DIAGNOSIS:   Increased nutrient needs related to wound healing, chronic illness (CHF, COPD, s/p renal transplant) as evidenced by estimated needs.  GOAL:   Patient will meet greater than or equal to 90% of their needs  MONITOR:   PO intake, Supplement acceptance, Labs, Weight trends, Skin  REASON FOR ASSESSMENT:   Malnutrition Screening Tool    ASSESSMENT:   64 year old female who presented with fever, lethargy, and SOB. 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 Pt admitted with SIRS.   No meal completions recorded since admission.  Spoke with pt via phone call to room. Pt reports appetite is okay and she was able to eat some of breakfast. Pt denies any nausea at this time.  Pt reports that she has had some weight loss starting in March 2021. She reports her UBW as 178 lbs. Pt reports that she lost weight because she was at a rehab facility and was unable to tolerate the food. Pt reports she was put on a renal diet and wasn't able to eat much.  Pt states that she likes Ensure Enlive and is willing to drink them during this admission. Pt prefers strawberry flavor.  Reviewed weight history in chart. Pt with an 8.6 kg weight loss since 02/10/20. This is a 10.7% weight loss in less than 5 months which is significant for timeframe. Pt may have some degree of malnutrition but unable to confirm at this time without NFPE.  Of note, pt with mild pitting edema to BLE per nursing documentation which may be masking additional weight loss.  Medications reviewed and  include: vitamin C, solu-medrol, protonix, vitamin B-12, IV abx IVF: NS @ 75 ml/hr  Labs reviewed: hemoglobin 9.2  UOP: 1150 ml x 24 hours  NUTRITION - FOCUSED PHYSICAL EXAM:  Unable to complete at this time. RD working remotely.  Diet Order:   Diet Order            Diet Heart Room service appropriate? Yes; Fluid consistency: Thin  Diet effective now                 EDUCATION NEEDS:   Education needs have been addressed  Skin:  Skin Assessment: Skin Integrity Issues: Stage III: coccyx  Last BM:  07/01/20  Height:   Ht Readings from Last 1 Encounters:  07/01/20 5' 1"  (1.549 m)    Weight:   Wt Readings from Last 1 Encounters:  07/01/20 72 kg    Ideal Body Weight:  47.7 kg  BMI:  Body mass index is 29.99 kg/m.  Estimated Nutritional Needs:   Kcal:  1800-2000  Protein:  85-100 grams  Fluid:  1.8-2.0 L    Gaynell Face, MS, RD, LDN Inpatient Clinical Dietitian Please see AMiON for contact information.

## 2020-07-05 ENCOUNTER — Ambulatory Visit (HOSPITAL_COMMUNITY): Payer: Medicare Other

## 2020-07-05 ENCOUNTER — Telehealth: Payer: Self-pay | Admitting: Pulmonary Disease

## 2020-07-05 ENCOUNTER — Telehealth: Payer: Self-pay | Admitting: *Deleted

## 2020-07-05 ENCOUNTER — Telehealth: Payer: Self-pay | Admitting: Hematology and Oncology

## 2020-07-05 LAB — CULTURE, BLOOD (ROUTINE X 2): Special Requests: ADEQUATE

## 2020-07-05 NOTE — Telephone Encounter (Signed)
Patient contacted regarding a sooner appointment with Dr. Vaughan Browner. There is no availability but patient feels she is stable enough to wait for appointment on 8/24 with Derl Barrow NP. Patient instructed to call our office if her breathing changes.

## 2020-07-05 NOTE — Telephone Encounter (Signed)
Received call from pt asking if Dr. Lorenso Courier would have enough time to review her PET scan results before her appt with heim on 07/15/20. Her PET scan is scheduled for 07/12/20. Advised that he should get the results in time for her appt. She also states she was recently in the hospital for SOB, fever. Pt states she is feeling a bit better. She was admitted on 07/01/20 and discharged on 07/03/20

## 2020-07-05 NOTE — Telephone Encounter (Signed)
Scheduled appt per 8/13 sch msg - pt aware of new appt date and time

## 2020-07-07 LAB — CULTURE, BLOOD (ROUTINE X 2): Special Requests: ADEQUATE

## 2020-07-12 ENCOUNTER — Ambulatory Visit (HOSPITAL_COMMUNITY): Admission: RE | Admit: 2020-07-12 | Payer: Medicare Other | Source: Ambulatory Visit

## 2020-07-15 ENCOUNTER — Inpatient Hospital Stay: Payer: Medicare Other | Admitting: Hematology and Oncology

## 2020-07-15 ENCOUNTER — Inpatient Hospital Stay: Payer: Medicare Other

## 2020-07-15 ENCOUNTER — Ambulatory Visit (HOSPITAL_COMMUNITY): Admission: RE | Admit: 2020-07-15 | Payer: Medicare Other | Source: Ambulatory Visit

## 2020-07-15 ENCOUNTER — Ambulatory Visit: Payer: Medicare Other | Admitting: Hematology and Oncology

## 2020-07-15 ENCOUNTER — Ambulatory Visit: Payer: Medicare Other | Admitting: Pulmonary Disease

## 2020-07-15 LAB — ACID FAST CULTURE WITH REFLEXED SENSITIVITIES (MYCOBACTERIA): Acid Fast Culture: NEGATIVE

## 2020-07-16 ENCOUNTER — Ambulatory Visit (INDEPENDENT_AMBULATORY_CARE_PROVIDER_SITE_OTHER): Payer: Medicare Other | Admitting: Primary Care

## 2020-07-16 ENCOUNTER — Encounter: Payer: Self-pay | Admitting: Primary Care

## 2020-07-16 ENCOUNTER — Other Ambulatory Visit: Payer: Self-pay

## 2020-07-16 DIAGNOSIS — J45901 Unspecified asthma with (acute) exacerbation: Secondary | ICD-10-CM | POA: Diagnosis not present

## 2020-07-16 MED ORDER — BREZTRI AEROSPHERE 160-9-4.8 MCG/ACT IN AERO: 2.0000 | INHALATION_SPRAY | Freq: Two times a day (BID) | RESPIRATORY_TRACT | 0 refills | Status: DC

## 2020-07-16 MED ORDER — PREDNISONE 10 MG PO TABS: ORAL_TABLET | ORAL | 0 refills | Status: DC

## 2020-07-16 NOTE — Assessment & Plan Note (Addendum)
-   Patient reports increased shortness of breath with associated wheezing. She has frequent ED/hospital admissions which is not new for her. Recommend trial Breztri two puffs twice daily with spacer. We will send in prednisone taper for acute exacerbation. Allergy markers do not appear elevated from Sept 2020 (IgE 2; Eos 0.0 - 0.2). Do not recommend starting biologic at this time, she can discuss further at next visit with Dr. Vaughan Browner

## 2020-07-16 NOTE — Progress Notes (Signed)
@Patient  ID: April [name], female    DOB: 06/06/1956, 65 y.o.   MRN: [id number]  Chief Complaint  Patient presents with  . Hospitalization Follow-up    Referring provider: [name], MD   HPI: 64 year old female, former smoker quit 1999 (3.75-pack-year history).  Past medical history significant for mild intermittent asthma, COPD, chronic respiratory failure with hypoxia, recurrent bronchitis, hypertension, pericardial effusion, diastolic heart failure, GERD, polycystic kidney disease status post renal transplant 2013, SIRS.  Patient of Dr. Vaughan Browner, last seen in office by pulmonary nurse practitioner on 09/22/2019. Recommended 6 week follow-up  She was recently admitted in the hospital from 8/9-8/11/21 for bandemia.  Chest x-ray on August 9 showed persistent small bilateral pleural effusions with bibasilar airspace disease, favored atelectasis.  Stable cardiac enlargement with vascular congestion. CBC features leukocytosis 21,000 and chronic anemia with hemoglobin 9.7.  Covid and PCR negative.  Lactic acid was normal.  BNP elevated at 558.  She was treated with IV fluids, vancomycin, cefepime and Flagyl.  Patient reported general malaise, poor oral intake. 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 She is maintained on Symbicort 80, prednisone 50m and 3 L of oxygen.   07/16/2020- interim hx Patient presents today for regular follow-up office visit.  She was last seen in October 2020. She reports worsening in her breathing. Associated wheezing and chest tightness. She reports compliance with budesonide nebulizer twice daily and she is using ipratropium hfa twice daily (previously on Symbicort 80). She has been taking lasix as prescribed. She missed her apt with oncology d.t illness. Needs to reschedule.    Imaging: CT high res 07/27/14 No ILD.   Chest x-ray 06/29/2019-mild vascular congestion, bibasilar atelectasis.    PFTs: 08/08/14 FVC 1.66 [52%] , FEV1 1.35 (57%], F/F 81, RV/TLC elevated. Mild obstructive small airway disease, hyperinflation, air trapping.  10/26/16 FVC 1.41 [47%), FEV1 1.20 (52%), F/F 85, TLC 68%, RV/TLC 127%, DLCO 71% Minimal obstructive airway disease, hyperinflation, mild restriction andDLCOimpairment  09/22/2019-pulmonary function test-FVC 1.33 (46% predicted), postbronchodilator ratio 83, postbronchodilator FEV1 1.19 (53% predicted), positive bronchodilator response in FEV1 as well as mid flow reversibility, DLCO 12.9 (71% predicted)   Allergies  Allergen Reactions  . Infed [Iron Dextran] Other (See Comments)    Chest tightness  . Pentamidine Itching, Shortness Of Breath and Swelling  . Erythromycin [Erythromycin] Other (See Comments)    Mouth Ulcers  . Iohexol Other (See Comments)    Per patient "she has had a kidney transplant and should never have contrast"  . Oxycodone Nausea Only and Nausea And Vomiting  . Erythromycin Rash    Causes breakout in mouth  . Ultram [Tramadol Hcl] Anxiety    Immunization History  Administered Date(s) Administered  . Influenza Split 09/23/2013, 08/24/2015  . Influenza,inj,Quad PF,6+ Mos 09/23/2016, 09/16/2019  . Influenza-Unspecified 07/27/2011, 09/28/2012, 07/25/2015, 07/24/2017  . PFIZER SARS-COV-2 Vaccination 06/21/2020  . Pneumococcal Conjugate-13 11/06/2014  . Pneumococcal Polysaccharide-23 08/14/2002, 05/22/2015    Past Medical History:  Diagnosis Date  . Arthritis   . Asthma   . [place]   . COPD (chronic obstructive pulmonary disease) ([place]   . Depression   . Essential hypertension 02/08/2020  . GERD (gastroesophageal reflux disease)   . Hyperlipidemia   . Hyperparathyroidism   . Polycystic kidney   . PONV (postoperative nausea and vomiting)     Tobacco History: Social History   Tobacco Use  Smoking Status Former Smoker  . Packs/day: 0.25  .  Years: 15.00  . Pack years: 3.75   . Types: Cigarettes  . Quit date: 03/22/1998  . Years since quitting: 22.3  Smokeless Tobacco Never Used   Counseling given: Not Answered   Outpatient Medications Prior to Visit  Medication Sig Dispense Refill  . acetaminophen (TYLENOL) 325 MG tablet Take 2 tablets (650 mg total) by mouth every 6 (six) hours as needed for mild pain (or Fever >/= 101).    [name] albuterol (VENTOLIN HFA) 108 (90 Base) MCG/ACT inhaler Inhale 2 puffs into the lungs every 4 (four) hours as needed for wheezing or shortness of breath. 18 g 0  . allopurinol (ZYLOPRIM) 300 MG tablet Take 1 tablet (300 mg total) by mouth daily. 30 tablet 0  . ascorbic acid (VITAMIN C) 500 MG tablet Take 500 mg by mouth daily.    [name] aspirin 81 MG chewable tablet Chew 81 mg by mouth daily.    . brimonidine (ALPHAGAN P) 0.1 % SOLN Place 1 drop into both eyes 2 (two) times daily.     . budesonide (PULMICORT) 1 MG/2ML nebulizer solution Take 1 mg by nebulization in the morning and at bedtime.    . cholecalciferol (VITAMIN D3) 25 MCG (1000 UNIT) tablet Take 1 tablet (1,000 Units total) by mouth daily. 30 tablet 0  . cyanocobalamin 1000 MCG tablet Take 1,000 mcg by mouth daily.    . cycloSPORINE (RESTASIS) 0.05 % ophthalmic emulsion Place 1 drop into both eyes 2 (two) times daily. 0.4 mL 0  . dorzolamide (TRUSOPT) 2 % ophthalmic solution Place 1 drop into both eyes daily.    . ferrous sulfate 325 (65 FE) MG tablet Take 325 mg by mouth daily with breakfast.    . FLUoxetine (PROZAC) 40 MG capsule Take 1 capsule (40 mg total) by mouth daily. 30 capsule 3  . furosemide (LASIX) 40 MG tablet Take 1 tablet (40 mg total) by mouth daily. 30 tablet 0  . gabapentin (NEURONTIN) 100 MG capsule Take 200 mg by mouth 3 (three) times daily.    [name] ipratropium (ATROVENT HFA) 17 MCG/ACT inhaler Inhale 1 puff into the lungs every 6 (six) hours. For COPD    . isosorbide mononitrate (IMDUR) 60 MG 24 hr tablet Take 1 tablet (60 mg total) by mouth daily.    [name]  levalbuterol (XOPENEX) 0.63 MG/3ML nebulizer solution Take 0.63 mg by nebulization See admin instructions. Inhale 0.29m once daily.  May take 0.623mevery six hours as needed COPD.    . magnesium oxide (MAG-OX) 400 MG tablet Take 1 tablet (400 mg total) by mouth 2 (two) times daily. 60 tablet 0  . Multiple Vitamin (MULTIVITAMIN WITH MINERALS) TABS tablet Take 1 tablet by mouth daily.    . mycophenolate (MYFORTIC) 180 MG EC tablet Take 180 mg by mouth 2 (two) times daily.    . pantoprazole (PROTONIX) 40 MG tablet Take 1 tablet (40 mg total) by mouth 2 (two) times daily. 60 tablet 0  . predniSONE (DELTASONE) 5 MG tablet Take 7.5 mg by mouth daily with breakfast.    . PROGRAF 1 MG capsule Take 1-2 mg by mouth daily. 38m51mn the morning 1mg738m the afternoon    . sennosides-docusate sodium (SENOKOT-S) 8.6-50 MG tablet Take 1 tablet by mouth daily.     . TRAVATAN Z 0.004 % SOLN ophthalmic solution Place 1 drop into both eyes at bedtime.    . valACYclovir (VALTREX) 500 MG tablet Take 500 mg by mouth 2 (two) times daily.    .[name]  verapamil (CALAN-SR) 240 MG CR tablet Take 240 mg by mouth daily.    . collagenase (SANTYL) ointment Apply 1 application topically daily. Sacral wound (Patient not taking: Reported on 07/16/2020)     No facility-administered medications prior to visit.    Review of Systems  Review of Systems  Respiratory: Positive for shortness of breath and wheezing.     Physical Exam  BP 130/66 (BP Location: Right Arm, Cuff Size: Normal)   Pulse (!) 109   Temp 98 F (36.7 C) (Temporal)   Ht 5' 1"  (1.549 m)   Wt 163 lb (73.9 kg)   SpO2 91%   BMI 30.80 kg/m  Physical Exam Constitutional:      Appearance: Normal appearance.  Cardiovascular:     Rate and Rhythm: Normal rate and regular rhythm.  Pulmonary:     Effort: Pulmonary effort is normal.     Breath sounds: Wheezing present.     Comments: 91% 2L Musculoskeletal:     Comments: Left hand swelling  Skin:    General: Skin is  warm and dry.     Comments: Scattered bruising  Neurological:     Mental Status: She is alert.  Psychiatric:        Mood and Affect: Mood normal.        Behavior: Behavior normal.        Thought Content: Thought content normal.        Judgment: Judgment normal.      Lab Results:  CBC    Component Value Date/Time   WBC 14.1 (H) 07/03/2020 0350   RBC 2.98 (L) 07/03/2020 0350   HGB 9.2 (L) 07/03/2020 0350   HGB 9.3 (L) 06/10/2020 1428   HGB 10.6 (L) 12/29/2010 1522   HCT 31.4 (L) 07/03/2020 0350   HCT 31.2 (L) 12/29/2010 1522   PLT 191 07/03/2020 0350   PLT 246 06/10/2020 1428   PLT 205 12/29/2010 1522   MCV 105.4 (H) 07/03/2020 0350   MCV 94 12/29/2010 1522   MCH 30.9 07/03/2020 0350   MCHC 29.3 (L) 07/03/2020 0350   RDW 19.3 (H) 07/03/2020 0350   RDW 12.8 12/29/2010 1522   LYMPHSABS 1.2 07/01/2020 2334   LYMPHSABS 1.1 12/29/2010 1522   MONOABS 0.5 07/01/2020 2334   EOSABS 0.2 07/01/2020 2334   EOSABS 0.2 12/29/2010 1522   BASOSABS 0.1 07/01/2020 2334   BASOSABS 0.0 12/29/2010 1522    BMET    Component Value Date/Time   NA 139 07/03/2020 0350   K 5.1 07/03/2020 0350   CL 108 07/03/2020 0350   CO2 20 (L) 07/03/2020 0350   GLUCOSE 187 (H) 07/03/2020 0350   BUN 42 (H) 07/03/2020 0350   CREATININE 1.17 (H) 07/03/2020 0350   CREATININE 1.07 (H) 06/10/2020 1428   CALCIUM 9.0 07/03/2020 0350   CALCIUM 10.8 (H) 10/13/2011 1405   GFRNONAA 50 (L) 07/03/2020 0350   GFRNONAA 55 (L) 06/10/2020 1428   GFRAA 57 (L) 07/03/2020 0350   GFRAA >60 06/10/2020 1428    BNP    Component Value Date/Time   BNP 557.8 (H) 07/01/2020 2334    ProBNP No results found for: PROBNP  Imaging: DG Chest Port 1 View  Result Date: 07/01/2020 CLINICAL DATA:  Worsening shortness of breath. EXAM: PORTABLE CHEST 1 VIEW COMPARISON:  June 06, 2019 FINDINGS: The lung volumes are low. There is persistent blunting of the costophrenic angles bilaterally. The heart size is stable but mildly  enlarged. There is vascular congestion. Stable bibasilar  airspace disease is noted and favored to represent atelectasis. There is no pneumothorax. There is no acute osseous abnormality. IMPRESSION: 1. Persistent small bilateral pleural effusions with bibasilar airspace disease, favored to represent atelectasis. 2. Stable cardiac enlargement with vascular congestion. Electronically Signed   By: Constance Holster M.D.   On: 07/01/2020 23:58     Assessment & Plan:   Asthma, chronic, unspecified asthma severity, with acute exacerbation - Patient reports increased shortness of breath with associated wheezing. She has frequent ED/hospital admissions which is not new for her. Recommend trial Breztri two puffs twice daily with spacer. We will send in prednisone taper for acute exacerbation. Allergy markers do not appear elevated from Sept 2020 (IgE 2; Eos 0.0 - 0.2). Do not recommend starting biologic at this time, she can discuss further at next visit with Dr. Ashley Mariner, NP 07/16/2020

## 2020-07-16 NOTE — Patient Instructions (Addendum)
Recommendations: - Stop budesonide(pulmicort) nebulizer - Stop Atrovent inhaler  - Start trial Breztri two puffs twice daily (use with spacer and rinse mouth after) - Continue albuterol/levalbuterol every 6 hours as needed for breakthrough shortness of breath/wheezing - Continue lasix  40mg  daily   Rx: - Prednisone taper (40mg  x 2 days; 30mg  x 2 days; 20mg  x 2 days; 10mg  x 2 days)  Follow-up: - 4 weeks with Dr. Vaughan Browner or first available with him please  - Please follow back with oncology

## 2020-07-18 ENCOUNTER — Encounter (HOSPITAL_COMMUNITY): Payer: Self-pay | Admitting: Emergency Medicine

## 2020-07-18 ENCOUNTER — Telehealth: Payer: Self-pay | Admitting: *Deleted

## 2020-07-18 ENCOUNTER — Emergency Department (HOSPITAL_COMMUNITY): Payer: Medicare Other

## 2020-07-18 ENCOUNTER — Telehealth: Payer: Self-pay | Admitting: Pulmonary Disease

## 2020-07-18 ENCOUNTER — Emergency Department (HOSPITAL_COMMUNITY)
Admission: EM | Admit: 2020-07-18 | Discharge: 2020-07-18 | Disposition: A | Discharge status: Home / Self Care | Payer: Medicare Other | Attending: Emergency Medicine | Admitting: Emergency Medicine

## 2020-07-18 DIAGNOSIS — R000 Tachycardia, unspecified: Secondary | ICD-10-CM | POA: Insufficient documentation

## 2020-07-18 DIAGNOSIS — I5033 Acute on chronic diastolic (congestive) heart failure: Secondary | ICD-10-CM | POA: Diagnosis not present

## 2020-07-18 DIAGNOSIS — N1831 Chronic kidney disease, stage 3a: Secondary | ICD-10-CM | POA: Insufficient documentation

## 2020-07-18 DIAGNOSIS — J45909 Unspecified asthma, uncomplicated: Secondary | ICD-10-CM | POA: Insufficient documentation

## 2020-07-18 DIAGNOSIS — Z87891 Personal history of nicotine dependence: Secondary | ICD-10-CM | POA: Insufficient documentation

## 2020-07-18 DIAGNOSIS — J441 Chronic obstructive pulmonary disease with (acute) exacerbation: Secondary | ICD-10-CM | POA: Diagnosis not present

## 2020-07-18 DIAGNOSIS — Z992 Dependence on renal dialysis: Secondary | ICD-10-CM | POA: Diagnosis not present

## 2020-07-18 DIAGNOSIS — Z7951 Long term (current) use of inhaled steroids: Secondary | ICD-10-CM | POA: Insufficient documentation

## 2020-07-18 DIAGNOSIS — R0602 Shortness of breath: Secondary | ICD-10-CM | POA: Diagnosis present

## 2020-07-18 DIAGNOSIS — Z79899 Other long term (current) drug therapy: Secondary | ICD-10-CM | POA: Diagnosis not present

## 2020-07-18 DIAGNOSIS — R600 Localized edema: Secondary | ICD-10-CM | POA: Diagnosis not present

## 2020-07-18 DIAGNOSIS — I130 Hypertensive heart and chronic kidney disease with heart failure and stage 1 through stage 4 chronic kidney disease, or unspecified chronic kidney disease: Secondary | ICD-10-CM | POA: Insufficient documentation

## 2020-07-18 DIAGNOSIS — Z7982 Long term (current) use of aspirin: Secondary | ICD-10-CM | POA: Insufficient documentation

## 2020-07-18 DIAGNOSIS — Z96642 Presence of left artificial hip joint: Secondary | ICD-10-CM | POA: Insufficient documentation

## 2020-07-18 DIAGNOSIS — Z20822 Contact with and (suspected) exposure to covid-19: Secondary | ICD-10-CM | POA: Insufficient documentation

## 2020-07-18 LAB — COMPREHENSIVE METABOLIC PANEL
ALT: 9 U/L (ref 0–44)
AST: 19 U/L (ref 15–41)
Albumin: 2.9 g/dL — ABNORMAL LOW (ref 3.5–5.0)
Alkaline Phosphatase: 86 U/L (ref 38–126)
Anion gap: 12 (ref 5–15)
BUN: 30 mg/dL — ABNORMAL HIGH (ref 8–23)
CO2: 24 mmol/L (ref 22–32)
Calcium: 9.4 mg/dL (ref 8.9–10.3)
Chloride: 95 mmol/L — ABNORMAL LOW (ref 98–111)
Creatinine, Ser: 1.39 mg/dL — ABNORMAL HIGH (ref 0.44–1.00)
GFR calc Af Amer: 47 mL/min — ABNORMAL LOW (ref >60)
GFR calc non Af Amer: 40 mL/min — ABNORMAL LOW (ref >60)
Glucose, Bld: 187 mg/dL — ABNORMAL HIGH (ref 70–99)
Potassium: 4.7 mmol/L (ref 3.5–5.1)
Sodium: 131 mmol/L — ABNORMAL LOW (ref 135–145)
Total Bilirubin: 1.2 mg/dL (ref 0.3–1.2)
Total Protein: 7.8 g/dL (ref 6.5–8.1)

## 2020-07-18 LAB — CBC WITH DIFFERENTIAL/PLATELET
Abs Immature Granulocytes: 0.04 10*3/uL (ref 0.00–0.07)
Basophils Absolute: 0 10*3/uL (ref 0.0–0.1)
Basophils Relative: 0 %
Eosinophils Absolute: 0 10*3/uL (ref 0.0–0.5)
Eosinophils Relative: 0 %
HCT: 30.6 % — ABNORMAL LOW (ref 36.0–46.0)
Hemoglobin: 9.3 g/dL — ABNORMAL LOW (ref 12.0–15.0)
Immature Granulocytes: 1 %
Lymphocytes Relative: 4 %
Lymphs Abs: 0.2 10*3/uL — ABNORMAL LOW (ref 0.7–4.0)
MCH: 30.5 pg (ref 26.0–34.0)
MCHC: 30.4 g/dL (ref 30.0–36.0)
MCV: 100.3 fL — ABNORMAL HIGH (ref 80.0–100.0)
Monocytes Absolute: 0.1 10*3/uL (ref 0.1–1.0)
Monocytes Relative: 1 %
Neutro Abs: 5.3 10*3/uL (ref 1.7–7.7)
Neutrophils Relative %: 94 %
Platelets: 159 10*3/uL (ref 150–400)
RBC: 3.05 MIL/uL — ABNORMAL LOW (ref 3.87–5.11)
RDW: 19.7 % — ABNORMAL HIGH (ref 11.5–15.5)
WBC: 5.7 10*3/uL (ref 4.0–10.5)
nRBC: 0 % (ref 0.0–0.2)

## 2020-07-18 LAB — SARS CORONAVIRUS 2 BY RT PCR (HOSPITAL ORDER, PERFORMED IN ~~LOC~~ HOSPITAL LAB): SARS Coronavirus 2: NEGATIVE

## 2020-07-18 LAB — TROPONIN I (HIGH SENSITIVITY)
Troponin I (High Sensitivity): 5 ng/L (ref <18)
Troponin I (High Sensitivity): 4 ng/L (ref <18)

## 2020-07-18 LAB — BRAIN NATRIURETIC PEPTIDE: B Natriuretic Peptide: 123.4 pg/mL — ABNORMAL HIGH (ref 0.0–100.0)

## 2020-07-18 MED ORDER — ALBUTEROL SULFATE HFA 108 (90 BASE) MCG/ACT IN AERS
2.0000 | INHALATION_SPRAY | Freq: Once | RESPIRATORY_TRACT | Status: AC
  Administered 2020-07-18: 2 via RESPIRATORY_TRACT
  Filled 2020-07-18: qty 6.7

## 2020-07-18 MED ORDER — FUROSEMIDE 10 MG/ML IJ SOLN
40.0000 mg | Freq: Once | INTRAMUSCULAR | Status: AC
  Administered 2020-07-18: 40 mg via INTRAVENOUS
  Filled 2020-07-18: qty 4

## 2020-07-18 NOTE — ED Triage Notes (Signed)
Pt presents from home with c/o shortness of breath for the past 5 days, worsening over the last 2 hours. Pt has COPD and wears O2 at home, 2L. Pt is currently on 3L for EMS at this time.

## 2020-07-18 NOTE — ED Notes (Signed)
Fran Neiswonger, daughter, [phone], please call with update.

## 2020-07-18 NOTE — Telephone Encounter (Signed)
Received call from patient. She states she did not get her PET scan done as she is still struggling with respiratory issues. She is unable to lay flat for scan. She is being seen by [facility]-last appt was 07/16/20.  She needs to cancel her appt with Dr. Lorenso Courier that is scheduled for Friday, 07/19/20 as she has not had the PET scan yet.  Provided phone # for [facility] to patient so she can schedule the scan when she feels she is able. Advised for her to call us when she has the PET scan scheduled.

## 2020-07-18 NOTE — Discharge Instructions (Addendum)
You have been evaluated for your shortness of breath.  Your symptoms may be due to COPD exacerbation as well as congestive heart failure.  Please continue to take your Lasix as prescribed and do not miss any doses.  Use albuterol inhaler 2 puffs every 4 hours as needed for shortness of breath.  Fortunately your COVID-19 test is negative.  No evidence of acute heart injuries.  Follow-up closely with your provider for further care.  Return if you have any concern.

## 2020-07-18 NOTE — Telephone Encounter (Signed)
Spoke with pt's home health nurse  She states that pt is declining since appt here with [facility] 8/23  Pt given the following recs and is following them: Instructions  Recommendations: - Stop budesonide(pulmicort) nebulizer - Stop Atrovent inhaler  - Start trial Breztri two puffs twice daily (use with spacer and rinse mouth after) - Continue albuterol/levalbuterol every 6 hours as needed for breakthrough shortness of breath/wheezing - Continue lasix  40mg  daily    Rx: - Prednisone taper (40mg  x 2 days; 30mg  x 2 days; 20mg  x 2 days; 10mg  x 2 days)   Follow-up: - 4 weeks with Dr. Vaughan Browner or first available with him please  - Please follow back with oncology        She states that she is there with her now and she is so SOB she can barely talk  She has respirations of 40  Sats 97% 2lpm  She denies fever  She states she has no color at all and feels very sluggish  She is also passing blood in her urine  I advised may need ED at this point with all of this going on hard to deal with telephonically   Beth out of clinic  Dr Vaughan Browner- please advise thanks!

## 2020-07-18 NOTE — ED Provider Notes (Signed)
[facility] DEPT Provider Note   CSN: [id number] Arrival date & time: 07/18/20  1313     History Chief Complaint  Patient presents with  . Shortness of Breath    April [name] is a 64 y.o. female.  The history is provided by the patient and medical records. No language interpreter was used.  Shortness of Breath    64 year old female significant history of COPD on home 2L O2, CHF with EF 55-60%, asthma, hypertension, GERD brought here via EMS from home for evaluation of shortness of breath.  Patient endorsed for the past several weeks she has had progressive worsening shortness of breath, generalized fatigue, fluid retention, and overall not feeling well.  She also endorsed pressure around her chest worse with exertion.  She admits she may have missed taking her Lasix for the past few days.  She endorsed occasional nonproductive cough.  She does not complain of any fever or chills no vomiting or diarrhea no trouble urinating.  She however report feeling dehydrated.  She has had her first shot of [place] Covid vaccination with the next one scheduled for next week.  She has had kidney transplant.  She lives at home with her husband and her mother.  Past Medical History:  Diagnosis Date  . Arthritis   . Asthma   . CHF (congestive heart failure) ([place])   . COPD (chronic obstructive pulmonary disease) ([place])   . Depression   . Essential hypertension 02/08/2020  . GERD (gastroesophageal reflux disease)   . Hyperlipidemia   . Hyperparathyroidism   . Polycystic kidney   . PONV (postoperative nausea and vomiting)     Patient Active Problem List   Diagnosis Date Noted  . SIRS (systemic inflammatory response syndrome) ([place]) 07/02/2020  . Chronic kidney disease, stage 3a   . 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 History of immunosuppressive therapy 05/27/2020  . FUO (fever of unknown origin) 05/15/2020  . Immunocompromised state ([place])   .  Decubitus ulcer of coccyx, unstageable ([place]) 05/01/2020  . Pericardial effusion 04/16/2020  . Chronic respiratory failure with hypoxia ([place]) 04/16/2020  . Proteinuria 04/16/2020  . Pressure injury of skin 04/08/2020  . COPD (chronic obstructive pulmonary disease) ([place]) 04/07/2020  . SVT (supraventricular tachycardia) ([place])   . Anemia of chronic disease   . Palliative care encounter   . Goals of care, counseling/discussion   . Essential hypertension 02/08/2020  . Glaucoma 02/08/2020  . Fracture of left superior pubic ramus (H[facility])   . Pain   . Anxiety state   . Left displaced femoral neck fracture ([place]) 12/15/2019  . Status post total hip replacement, left   . Post-op pain   . Polycystic kidney   . Closed left hip fracture ([place]) 12/07/2019  . Left wrist fracture 12/07/2019  . Medication management 09/22/2019  . Physical deconditioning 09/22/2019  . Acute respiratory failure with hypoxia ([place]) 04/19/2019  . Acute on chronic diastolic CHF (congestive heart failure) ([place]) 04/19/2019  . Bilateral closed proximal tibial fracture 12/21/2018  . Asthma, chronic, unspecified asthma severity, with acute exacerbation 10/03/2018  . Chronic diastolic CHF (congestive heart failure) ([place]) 10/01/2017  . Asthma, mild intermittent 07/15/2016  . GERD without esophagitis 07/15/2016  . Renal transplant recipient 07/15/2016  . Hyperlipidemia 07/15/2016  . Depression 07/15/2016  . Bronchitis, mucopurulent recurrent ([place]) 08/08/2014  . Chronic cough 07/24/2014  . End stage renal disease ([place]) 03/23/2012  . 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, implant, and graft 03/23/2012  Past Surgical History:  Procedure Laterality Date  . 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 BILATERAL OOPHORECTOMY    . BIOPSY  05/19/2020   Procedure: BIOPSY;  Surgeon: Carol Ada, MD;  Location: [facility] ENDOSCOPY;  Service: Endoscopy;;  . CARPAL TUNNEL RELEASE  2000  . CESAREAN SECTION     . COLONOSCOPY WITH PROPOFOL N/A 05/19/2020   Procedure: COLONOSCOPY WITH PROPOFOL;  Surgeon: Carol Ada, MD;  Location: [facility];  Service: Endoscopy;  Laterality: N/A;  . ENTEROSCOPY N/A 05/19/2020   Procedure: ENTEROSCOPY;  Surgeon: Carol Ada, MD;  Location: [facility];  Service: Endoscopy;  Laterality: N/A;  . INSERTION OF DIALYSIS CATHETER  03/31/2012   Procedure: INSERTION OF DIALYSIS CATHETER;  Surgeon: Angelia Mould, MD;  Location: [facility];  Service: Vascular;  Laterality: N/A;  insertion of dialysis catheter right internal jugular vein  . 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 ORIF TIBIA FRACTURE Left 12/23/2018   Procedure: OPEN REDUCTION INTERNAL FIXATION (ORIF) TIBIA FRACTURE;  Surgeon: Shona Needles, MD;  Location: [facility];  Service: Orthopedics;  Laterality: Left;  . ORIF TIBIA PLATEAU Right 12/23/2018   Procedure: OPEN REDUCTION INTERNAL FIXATION (ORIF) TIBIAL PLATEAU;  Surgeon: Shona Needles, MD;  Location: [facility];  Service: Orthopedics;  Laterality: Right;  . ORIF WRIST FRACTURE Left 12/08/2019   Procedure: OPEN REDUCTION INTERNAL FIXATION (ORIF) WRIST FRACTURE, REPAIR LACERATION LEFT FOREARM;  Surgeon: Dorna Leitz, MD;  Location: [facility];  Service: Orthopedics;  Laterality: Left;  . TONSILLECTOMY  1967  . TOTAL HIP ARTHROPLASTY Left 12/08/2019   Procedure: TOTAL HIP ARTHROPLASTY ANTERIOR APPROACH;  Surgeon: Dorna Leitz, MD;  Location: [facility];  Service: Orthopedics;  Laterality: Left;  . TUBAL LIGATION  2010     OB History   No obstetric history on file.     Family History  Problem Relation Age of Onset  . Hypertension Mother   . Heart disease Father        CABG history  . Polycystic kidney disease Father   . Polycystic kidney disease Brother     Social History   Tobacco Use  . Smoking status: Former Smoker    Packs/day: 0.25    Years: 15.00    Pack years: 3.75    Types: Cigarettes    Quit date: 03/22/1998    Years since quitting: 22.3  .  Smokeless tobacco: Never Used  Vaping Use  . Vaping Use: Never used  Substance Use Topics  . Alcohol use: No    Comment: social  . Drug use: No    Home Medications Prior to Admission medications   Medication Sig Start Date End Date Taking? Authorizing Provider  acetaminophen (TYLENOL) 325 MG tablet Take 2 tablets (650 mg total) by mouth every 6 (six) hours as needed for mild pain (or Fever >/= 101). 05/22/20   Elgergawy, Silver Huguenin, MD  albuterol (VENTOLIN HFA) 108 (90 Base) MCG/ACT inhaler Inhale 2 puffs into the lungs every 4 (four) hours as needed for wheezing or shortness of breath. 01/04/20   Angiulli, Lavon Paganini, PA-C  allopurinol (ZYLOPRIM) 300 MG tablet Take 1 tablet (300 mg total) by mouth daily. 01/04/20   Angiulli, Lavon Paganini, PA-C  ascorbic acid (VITAMIN C) 500 MG tablet Take 500 mg by mouth daily.    [provider]  aspirin 81 MG chewable tablet Chew 81 mg by mouth daily.    [provider]  brimonidine (ALPHAGAN P) 0.1 %  SOLN Place 1 drop into both eyes 2 (two) times daily.     [provider]  Budeson-Glycopyrrol-Formoterol (BREZTRI AEROSPHERE) 160-9-4.8 MCG/ACT AERO Inhale 2 puffs into the lungs in the morning and at bedtime. 07/16/20   Martyn Ehrich, NP  budesonide (PULMICORT) 1 MG/2ML nebulizer solution Take 1 mg by nebulization in the morning and at bedtime.    [provider]  cholecalciferol (VITAMIN D3) 25 MCG (1000 UNIT) tablet Take 1 tablet (1,000 Units total) by mouth daily. 01/04/20   Angiulli, Lavon Paganini, PA-C  collagenase (SANTYL) ointment Apply 1 application topically daily. Sacral wound Patient not taking: Reported on 07/16/2020    [provider]  cyanocobalamin 1000 MCG tablet Take 1,000 mcg by mouth daily.    [provider]  cycloSPORINE (RESTASIS) 0.05 % ophthalmic emulsion Place 1 drop into both eyes 2 (two) times daily. 01/04/20   Angiulli, Lavon Paganini, PA-C  dorzolamide (TRUSOPT) 2 % ophthalmic solution Place 1  drop into both eyes daily. 06/25/19   [provider]  ferrous sulfate 325 (65 FE) MG tablet Take 325 mg by mouth daily with breakfast.    [provider]  FLUoxetine (PROZAC) 40 MG capsule Take 1 capsule (40 mg total) by mouth daily. 01/04/20   Angiulli, Lavon Paganini, PA-C  furosemide (LASIX) 40 MG tablet Take 1 tablet (40 mg total) by mouth daily. 02/07/20   Raiford Noble Latif, DO  gabapentin (NEURONTIN) 100 MG capsule Take 200 mg by mouth 3 (three) times daily.    [provider]  ipratropium (ATROVENT HFA) 17 MCG/ACT inhaler Inhale 1 puff into the lungs every 6 (six) hours. For COPD    [provider]  isosorbide mononitrate (IMDUR) 60 MG 24 hr tablet Take 1 tablet (60 mg total) by mouth daily. 05/23/20   Elgergawy, Silver Huguenin, MD  levalbuterol Penne Lash) 0.63 MG/3ML nebulizer solution Take 0.63 mg by nebulization See admin instructions. Inhale 0.71m once daily.  May take 0.650mevery six hours as needed COPD.    [provider]  magnesium oxide (MAG-OX) 400 MG tablet Take 1 tablet (400 mg total) by mouth 2 (two) times daily. 01/04/20   Angiulli, DaLavon PaganiniPA-C  Multiple Vitamin (MULTIVITAMIN WITH MINERALS) TABS tablet Take 1 tablet by mouth daily. 02/24/20   EzAlma FriendlyMD  mycophenolate (MYFORTIC) 180 MG EC tablet Take 180 mg by mouth 2 (two) times daily.    [provider]  pantoprazole (PROTONIX) 40 MG tablet Take 1 tablet (40 mg total) by mouth 2 (two) times daily. 01/04/20   Angiulli, DaLavon PaganiniPA-C  predniSONE (DELTASONE) 10 MG tablet Take 4 tabs po daily x 2 days; then 3 tabs for 2 days; then 2 tabs for 2 days; then 1 tab for 2 days 07/16/20   WaMartyn EhrichNP  predniSONE (DELTASONE) 5 MG tablet Take 7.5 mg by mouth daily with breakfast.    [provider]  PROGRAF 1 MG capsule Take 1-2 mg by mouth daily. 25m57mn the morning 1mg75m the afternoon 03/22/19   [provider]  sennosides-docusate sodium (SENOKOT-S) 8.6-50  MG tablet Take 1 tablet by mouth daily.     [provider]  TRAVATAN Z 0.004 % SOLN ophthalmic solution Place 1 drop into both eyes at bedtime. 07/03/16   [provider]  valACYclovir (VALTREX) 500 MG tablet Take 500 mg by mouth 2 (two) times daily.    [provider]  verapamil (CALAN-SR) 240 MG CR tablet Take  240 mg by mouth daily.    [provider]    Allergies    Ardyth Harps [iron dextran], Pentamidine, Erythromycin [erythromycin], Iohexol, Oxycodone, Erythromycin, and Ultram [tramadol hcl]  Review of Systems   Review of Systems  Respiratory: Positive for shortness of breath.   All other systems reviewed and are negative.   Physical Exam Updated Vital Signs BP (!) 147/67 (BP Location: Right Arm)   Pulse (!) 103   Temp 98.3 F (36.8 C) (Oral)   Resp (!) 25   SpO2 99%   Physical Exam Vitals and nursing note reviewed.  Constitutional:      General: She is not in acute distress.    Appearance: She is well-developed. She is obese. She is ill-appearing.     Comments: Chronically ill-appearing female in mild respiratory discomfort.  She is wearing supplemental oxygen.  HENT:     Head: Atraumatic.  Eyes:     Conjunctiva/sclera: Conjunctivae normal.  Neck:     Vascular: JVD present.  Cardiovascular:     Rate and Rhythm: Tachycardia present.  Pulmonary:     Breath sounds: Decreased breath sounds and rales present. No wheezing.  Chest:     Chest wall: No tenderness.  Musculoskeletal:        General: Normal range of motion.     Cervical back: Neck supple.     Right lower leg: Edema present.     Left lower leg: Edema present.     Comments: 2+ pitting edema to bilateral lower extremities extending towards the knees.   Skin:    Findings: No rash.  Neurological:     Mental Status: She is alert and oriented to person, place, and time.  Psychiatric:        Mood and Affect: Mood normal.     ED Results / Procedures / Treatments   Labs (all  labs ordered are listed, but only abnormal results are displayed) Labs Reviewed  CBC WITH DIFFERENTIAL/PLATELET - Abnormal; Notable for the following components:      Result Value   RBC 3.05 (*)    Hemoglobin 9.3 (*)    HCT 30.6 (*)    MCV 100.3 (*)    RDW 19.7 (*)    Lymphs Abs 0.2 (*)    All other components within normal limits  BRAIN NATRIURETIC PEPTIDE - Abnormal; Notable for the following components:   B Natriuretic Peptide 123.4 (*)    All other components within normal limits  COMPREHENSIVE METABOLIC PANEL - Abnormal; Notable for the following components:   Sodium 131 (*)    Chloride 95 (*)    Glucose, Bld 187 (*)    BUN 30 (*)    Creatinine, Ser 1.39 (*)    Albumin 2.9 (*)    GFR calc non Af Amer 40 (*)    GFR calc Af Amer 47 (*)    All other components within normal limits  SARS CORONAVIRUS 2 BY RT PCR (HOSPITAL ORDER, [facility])  URINALYSIS, ROUTINE W REFLEX MICROSCOPIC  TROPONIN I (HIGH SENSITIVITY)  TROPONIN I (HIGH SENSITIVITY)    EKG None  ED ECG REPORT   Date: 07/18/2020  Rate: 109  Rhythm: sinus tachycardia  QRS Axis: normal  Intervals: normal  ST/T Wave abnormalities: abnormal R-wave progression, late transition  Conduction Disutrbances:none  Narrative Interpretation:   Old EKG Reviewed: unchanged  I have personally reviewed the EKG tracing and agree with the computerized printout as noted.   Radiology DG Chest 2  View  Result Date: 07/18/2020 CLINICAL DATA:  Increasing shortness of breath over the past 5 days. EXAM: CHEST - 2 VIEW COMPARISON:  Single-view of the chest 07/01/2020 and 05/27/2020. FINDINGS: The patient has small bilateral pleural effusions, larger on the right. There is cardiomegaly and mild interstitial edema. No pneumothorax. No acute bony abnormality. IMPRESSION: Mild interstitial edema with associated small pleural effusions, larger on the right. Electronically Signed   By: Inge Rise M.D.   On:  07/18/2020 13:49    Procedures Procedures (including critical care time)  Medications Ordered in ED Medications  furosemide (LASIX) injection 40 mg (40 mg Intravenous Given 07/18/20 1603)  albuterol (VENTOLIN HFA) 108 (90 Base) MCG/ACT inhaler 2 puff (2 puffs Inhalation Given 07/18/20 1722)    ED Course  I have reviewed the triage vital signs and the nursing notes.  Pertinent labs & imaging results that were available during my care of the patient were reviewed by me and considered in my medical decision making (see chart for details).    MDM Rules/Calculators/A&P                          BP 122/68   Pulse 95   Temp 98.3 F (36.8 C) (Oral)   Resp (!) 24   SpO2 100%   Final Clinical Impression(s) / ED Diagnoses Final diagnoses:  COPD exacerbation ([place])    Rx / DC Orders ED Discharge Orders    None     3:25 PM Patient with history of CHF, COPD, asthma brought here for progressive worsening shortness of breath and chest tightness.  Patient appears to be fluid overload with positive JVD, wet sounding lungs, and peripheral edema to lower extremities.  No active chest pain.  No significant symptoms suggestive of COVID-19.  She admits missing a few of her regular Lasix doses which may contribute to her symptoms.  Will give Lasix, work-up initiated.  She is on home O2 at 2 L, she is currently at 3 L on 100% saturation.  5:12 PM EKG without acute ischemic changes, troponins negative, COVID-19 test is negative, BNP is mildly elevated at 123.4 however improved from prior.  Normal WBC.  Mild AKI with creatinine of 1.39.  Chest x-ray shows mild interstitial edema with associated small pleural effusion larger on the right.  I had the opportunity to talk to pt's daughter over the phone.  It appears pt has had these chronic sxs of shortness of breath, weakness, and fluid retention.  She is also being evaluated by oncologist Dr Lorenso Courier for potential Multiple Myeloma.  Given the chronicity of  her sxs, I have low suspicion for PE causing her SOB.    7:11 PM Patient refused to ambulate however on 2 L her saturation is at 100%.  Negative delta troponin.  COVID-19 test is negative.  After discussion with patient, plan to discharge home.  Encourage patient is a compliant with her Lasix and to follow-up with her oncologist for further care. Albuterol inhaler provided.  Does not think steroid is indicated at this time as her sxs likely multifactorial and medication non compliant. Return precaution discussed.    RAAGA MAEDER was evaluated in Emergency Department on 07/18/2020 for the symptoms described in the history of present illness. She was evaluated in the context of the global COVID-19 pandemic, which necessitated consideration that the patient might be at risk for infection with the SARS-CoV-2 virus that causes COVID-19. Institutional protocols and algorithms that  pertain to the evaluation of patients at risk for COVID-19 are in a state of rapid change based on information released by regulatory bodies including the CDC and federal and state organizations. These policies and algorithms were followed during the patient's care in the ED.    Domenic Moras, PA-C 07/18/20 Doran Heater    Lacretia Leigh, MD 07/22/20 [phone]

## 2020-07-18 NOTE — Telephone Encounter (Signed)
Spoke with Hinton Dyer and she is aware  Pt is already in route to [facility]

## 2020-07-18 NOTE — ED Notes (Signed)
Patient states she is unable to ambulate

## 2020-07-18 NOTE — Telephone Encounter (Signed)
Yes. Agree. Looks like she needs assessment in ED

## 2020-07-19 ENCOUNTER — Inpatient Hospital Stay: Payer: Medicare Other

## 2020-07-19 ENCOUNTER — Inpatient Hospital Stay: Payer: Medicare Other | Admitting: Hematology and Oncology

## 2020-08-01 ENCOUNTER — Other Ambulatory Visit: Payer: Self-pay

## 2020-08-01 ENCOUNTER — Ambulatory Visit (HOSPITAL_COMMUNITY)
Admission: RE | Admit: 2020-08-01 | Discharge: 2020-08-01 | Disposition: A | Discharge status: Home / Self Care | Payer: Medicare Other | Source: Ambulatory Visit | Attending: Hematology and Oncology | Admitting: Hematology and Oncology

## 2020-08-01 DIAGNOSIS — K429 Umbilical hernia without obstruction or gangrene: Secondary | ICD-10-CM | POA: Diagnosis not present

## 2020-08-01 DIAGNOSIS — J90 Pleural effusion, not elsewhere classified: Secondary | ICD-10-CM | POA: Diagnosis not present

## 2020-08-01 DIAGNOSIS — K5730 Diverticulosis of large intestine without perforation or abscess without bleeding: Secondary | ICD-10-CM | POA: Insufficient documentation

## 2020-08-01 DIAGNOSIS — Z940 Kidney transplant status: Secondary | ICD-10-CM | POA: Diagnosis not present

## 2020-08-01 DIAGNOSIS — Q613 Polycystic kidney, unspecified: Secondary | ICD-10-CM | POA: Insufficient documentation

## 2020-08-01 DIAGNOSIS — D472 Monoclonal gammopathy: Secondary | ICD-10-CM | POA: Insufficient documentation

## 2020-08-01 DIAGNOSIS — I517 Cardiomegaly: Secondary | ICD-10-CM | POA: Diagnosis not present

## 2020-08-01 DIAGNOSIS — Q446 Cystic disease of liver: Secondary | ICD-10-CM | POA: Diagnosis not present

## 2020-08-01 LAB — GLUCOSE, CAPILLARY: Glucose-Capillary: 172 mg/dL — ABNORMAL HIGH (ref 70–99)

## 2020-08-01 MED ORDER — FLUDEOXYGLUCOSE F - 18 (FDG) INJECTION
8.8000 | Freq: Once | INTRAVENOUS | Status: AC
  Administered 2020-08-01: 8.8 via INTRAVENOUS

## 2020-08-04 ENCOUNTER — Other Ambulatory Visit: Payer: Self-pay | Admitting: Hematology and Oncology

## 2020-08-04 DIAGNOSIS — D472 Monoclonal gammopathy: Secondary | ICD-10-CM

## 2020-08-04 NOTE — Progress Notes (Signed)
[place] [phone]   [phone]  PROGRESS NOTE  Patient Care Team: [name], MD as PCP - General (Family Medicine) Deterding, Jeneen Rinks, MD (Nephrology)  Hematological/Oncological History # Smoldering Multiple Myeloma, Low Risk  1) 12/30/2019: SPEP showed M protein 0.5, Kappa 4.4, Lambda 6.19, ratio 0.71. 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  2) 05/31/2020: SPEP ordered showed an M spike of 1.2 3) 05/31/2020: BmBx showed 11/% plasma cells with kappa restriction. Ordered due to concern for PTLD 4) 06/10/2020: establish care with Dr. Lorenso Courier 5) 08/01/2020: NM PET CT showed no evidence of lytic lesions  Interval History:  April [name] 64 y.o. female with medical history significant for smoldering multiple myeloma who presents for a follow up visit. The patient's last visit was on 06/10/2020. In the interim since the last visit the patient had a hospitalization on 8/9-8/09/2020 for fever and lethargy as well as an ED visit on 07/18/2020 for shortness of breath. She also had a PET CT which showed no evidence of lytic lesions.  On exam today April [name] notes that she has had a few illnesses since our last visit.  She knows she was unable to make it to her last appointment and PET CT scan as a result of fever.  Fortunately in the interim she has completed her PET CT scan which showed no evidence of lytic lesions.  The etiology the patient's fever was not entirely clear, however it resolved on its own.  The patient notes that she has been otherwise well in the interim with no bleeding, or dark stools.  She does have bruising on her hands bilaterally which is from her toilet chair reportedly.  Otherwise she has had no changes in her energy level and no new bone or back pain.  A full 10 point ROS is listed below.  MEDICAL HISTORY:  Past Medical History:  Diagnosis Date   Arthritis    Asthma    CHF (congestive heart failure) (HCC)    COPD (chronic obstructive  pulmonary disease) ([place])    Depression    Essential hypertension 02/08/2020   GERD (gastroesophageal reflux disease)    Hyperlipidemia    Hyperparathyroidism    Polycystic kidney    PONV (postoperative nausea and vomiting)     SURGICAL HISTORY: Past Surgical History:  Procedure Laterality Date   AV FISTULA PLACEMENT  10-21-2010   left Brachiocephalic AVF   BILATERAL OOPHORECTOMY     BIOPSY  05/19/2020   Procedure: BIOPSY;  Surgeon: Carol Ada, MD;  Location: [facility];  Service: Endoscopy;;   CARPAL TUNNEL RELEASE  2000   CESAREAN SECTION     COLONOSCOPY WITH PROPOFOL N/A 05/19/2020   Procedure: COLONOSCOPY WITH PROPOFOL;  Surgeon: Carol Ada, MD;  Location: [facility];  Service: Endoscopy;  Laterality: N/A;   ENTEROSCOPY N/A 05/19/2020   Procedure: ENTEROSCOPY;  Surgeon: Carol Ada, MD;  Location: [facility];  Service: Endoscopy;  Laterality: N/A;   INSERTION OF DIALYSIS CATHETER  03/31/2012   Procedure: INSERTION OF DIALYSIS CATHETER;  Surgeon: Angelia Mould, MD;  Location: [facility];  Service: Vascular;  Laterality: N/A;  insertion of dialysis catheter right internal jugular vein   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   ORIF TIBIA FRACTURE Left 12/23/2018   Procedure: OPEN REDUCTION INTERNAL FIXATION (ORIF) TIBIA FRACTURE;  Surgeon: Shona Needles, MD;  Location: [facility];  Service: Orthopedics;  Laterality: Left;   ORIF TIBIA PLATEAU Right 12/23/2018   Procedure: OPEN REDUCTION INTERNAL FIXATION (ORIF)  TIBIAL PLATEAU;  Surgeon: Shona Needles, MD;  Location: [facility];  Service: Orthopedics;  Laterality: Right;   ORIF WRIST FRACTURE Left 12/08/2019   Procedure: OPEN REDUCTION INTERNAL FIXATION (ORIF) WRIST FRACTURE, REPAIR LACERATION LEFT FOREARM;  Surgeon: Dorna Leitz, MD;  Location: [facility];  Service: Orthopedics;  Laterality: Left;   TONSILLECTOMY  1967   TOTAL HIP ARTHROPLASTY Left 12/08/2019   Procedure: TOTAL HIP ARTHROPLASTY ANTERIOR APPROACH;   Surgeon: Dorna Leitz, MD;  Location: [facility];  Service: Orthopedics;  Laterality: Left;   TUBAL LIGATION  2010    SOCIAL HISTORY: Social History   Socioeconomic History   Marital status: Married    Spouse name: Not on file   Number of children: Not on file   Years of education: Not on file   Highest education level: Not on file  Occupational History   Occupation: retired  Tobacco Use   Smoking status: Former Smoker    Packs/day: 0.25    Years: 15.00    Pack years: 3.75    Types: Cigarettes    Quit date: 03/22/1998    Years since quitting: 22.3   Smokeless tobacco: Never Used  Vaping Use   Vaping Use: Never used  Substance and Sexual Activity   Alcohol use: No    Comment: social   Drug use: No   Sexual activity: Not on file  Other Topics Concern   Not on file  Social History Narrative   Married, lives with spouse, son and mother   2 children > son and daughter   OCCUPATION: retired Education officer, museum   Social Determinants of Radio broadcast assistant Strain:    Difficulty of Paying Living Expenses: Not on file  Food Insecurity:    Worried About Charity fundraiser in the Last Year: Not on file   [place] in the Last Year: Not on file  Transportation Needs:    Film/video editor (Medical): Not on file   Lack of Transportation (Non-Medical): Not on file  Physical Activity:    Days of Exercise per Week: Not on file   Minutes of Exercise per Session: Not on file  Stress:    Feeling of Stress : Not on file  Social Connections:    Frequency of Communication with Friends and Family: Not on file   Frequency of Social Gatherings with Friends and Family: Not on file   Attends Religious Services: Not on file   Active Member of Clubs or Organizations: Not on file   Attends Archivist Meetings: Not on file   Marital Status: Not on file  Intimate Partner Violence:    Fear of Current or Ex-Partner: Not on file   Emotionally  Abused: Not on file   Physically Abused: Not on file   Sexually Abused: Not on file    FAMILY HISTORY: Family History  Problem Relation Age of Onset   Hypertension Mother    Heart disease Father        CABG history   Polycystic kidney disease Father    Polycystic kidney disease Brother     ALLERGIES:  is allergic to infed [iron dextran], pentamidine, erythromycin [erythromycin], iohexol, oxycodone, erythromycin, and ultram [tramadol hcl].  MEDICATIONS:  Current Outpatient Medications  Medication Sig Dispense Refill   acetaminophen (TYLENOL) 325 MG tablet Take 2 tablets (650 mg total) by mouth every 6 (six) hours as needed for mild pain (or Fever >/= 101).     albuterol (VENTOLIN HFA) 108 (  90 Base) MCG/ACT inhaler Inhale 2 puffs into the lungs every 4 (four) hours as needed for wheezing or shortness of breath. 18 g 0   allopurinol (ZYLOPRIM) 300 MG tablet Take 1 tablet (300 mg total) by mouth daily. 30 tablet 0   ascorbic acid (VITAMIN C) 500 MG tablet Take 500 mg by mouth daily.     aspirin 81 MG chewable tablet Chew 81 mg by mouth daily.     brimonidine (ALPHAGAN P) 0.1 % SOLN Place 1 drop into both eyes 2 (two) times daily.      cholecalciferol (VITAMIN D3) 25 MCG (1000 UNIT) tablet Take 1 tablet (1,000 Units total) by mouth daily. 30 tablet 0   cyanocobalamin 1000 MCG tablet Take 1,000 mcg by mouth daily.     cycloSPORINE (RESTASIS) 0.05 % ophthalmic emulsion Place 1 drop into both eyes 2 (two) times daily. 0.4 mL 0   dorzolamide (TRUSOPT) 2 % ophthalmic solution Place 1 drop into both eyes daily.     ferrous sulfate 325 (65 FE) MG tablet Take 325 mg by mouth daily with breakfast.     FLUoxetine (PROZAC) 40 MG capsule Take 1 capsule (40 mg total) by mouth daily. 30 capsule 3   furosemide (LASIX) 40 MG tablet Take 1 tablet (40 mg total) by mouth daily. 30 tablet 0   gabapentin (NEURONTIN) 100 MG capsule Take 200 mg by mouth 3 (three) times daily.      isosorbide mononitrate (IMDUR) 60 MG 24 hr tablet Take 1 tablet (60 mg total) by mouth daily.     levalbuterol (XOPENEX) 0.63 MG/3ML nebulizer solution Take 0.63 mg by nebulization See admin instructions. Inhale 0.28m once daily.  May take 0.68mevery six hours as needed COPD.     magnesium oxide (MAG-OX) 400 MG tablet Take 1 tablet (400 mg total) by mouth 2 (two) times daily. 60 tablet 0   Multiple Vitamin (MULTIVITAMIN WITH MINERALS) TABS tablet Take 1 tablet by mouth daily.     mycophenolate (MYFORTIC) 180 MG EC tablet Take 180 mg by mouth 2 (two) times daily.     pantoprazole (PROTONIX) 40 MG tablet Take 1 tablet (40 mg total) by mouth 2 (two) times daily. 60 tablet 0   predniSONE (DELTASONE) 10 MG tablet Take 4 tabs po daily x 2 days; then 3 tabs for 2 days; then 2 tabs for 2 days; then 1 tab for 2 days 20 tablet 0   predniSONE (DELTASONE) 5 MG tablet Take 7.5 mg by mouth daily with breakfast.     PROGRAF 1 MG capsule Take 1-2 mg by mouth daily. 9m86mn the morning 1mg8m the afternoon     promethazine (PHENERGAN) 25 MG tablet Take 25 mg by mouth 2 (two) times daily as needed.     sennosides-docusate sodium (SENOKOT-S) 8.6-50 MG tablet Take 1 tablet by mouth daily.      TRAVATAN Z 0.004 % SOLN ophthalmic solution Place 1 drop into both eyes at bedtime.     valACYclovir (VALTREX) 500 MG tablet Take 500 mg by mouth 2 (two) times daily.     verapamil (CALAN-SR) 240 MG CR tablet Take 240 mg by mouth daily.     No current facility-administered medications for this visit.    REVIEW OF SYSTEMS:   Constitutional: ( - ) fevers, ( - )  chills , ( - ) night sweats Eyes: ( - ) blurriness of vision, ( - ) double vision, ( - ) watery eyes Ears, nose, mouth, throat,  and face: ( - ) mucositis, ( - ) sore throat Respiratory: ( - ) cough, ( - ) dyspnea, ( - ) wheezes Cardiovascular: ( - ) palpitation, ( - ) chest discomfort, ( - ) lower extremity swelling Gastrointestinal:  ( - ) nausea, (  - ) heartburn, ( - ) change in bowel habits Skin: ( - ) abnormal skin rashes Lymphatics: ( - ) new lymphadenopathy, ( - ) easy bruising Neurological: ( - ) numbness, ( - ) tingling, ( - ) new weaknesses Behavioral/Psych: ( - ) mood change, ( - ) new changes  All other systems were reviewed with the patient and are negative.  PHYSICAL EXAMINATION:3 ECOG PERFORMANCE STATUS: 3 - Symptomatic, >50% confined to bed  Vitals:   08/05/20 1546  BP: 138/65  Pulse: 87  Resp: 18  Temp: 97.9 F (36.6 C)  SpO2: 95%   Filed Weights   08/05/20 1546  Weight: 164 lb (74.4 kg)    GENERAL: chronically ill appearing elderly Caucasian female. alert, no distress and comfortable SKIN: skin color, texture, turgor are normal, no rashes or significant lesions. Bruising on hands/arms bilaterally.  EYES: conjunctiva are pink and non-injected, sclera clear LUNGS: clear to auscultation and percussion with normal breathing effort HEART: regular rate & rhythm and no murmurs and no lower extremity edema Musculoskeletal: no cyanosis of digits and no clubbing  PSYCH: alert & oriented x 3, fluent speech NEURO: no focal motor/sensory deficits  LABORATORY DATA:  I have reviewed the data as listed CBC Latest Ref Rng & Units 08/05/2020 07/18/2020 07/03/2020  WBC 4.0 - 10.5 K/uL 9.1 5.7 14.1(H)  Hemoglobin 12.0 - 15.0 g/dL 8.6(L) 9.3(L) 9.2(L)  Hematocrit 36 - 46 % 28.4(L) 30.6(L) 31.4(L)  Platelets 150 - 400 K/uL 172 159 191    CMP Latest Ref Rng & Units 08/05/2020 07/18/2020 07/03/2020  Glucose 70 - 99 mg/dL 189(H) 187(H) 187(H)  BUN 8 - 23 mg/dL 45(H) 30(H) 42(H)  Creatinine 0.44 - 1.00 mg/dL 1.30(H) 1.39(H) 1.17(H)  Sodium 135 - 145 mmol/L 140 131(L) 139  Potassium 3.5 - 5.1 mmol/L 4.5 4.7 5.1  Chloride 98 - 111 mmol/L 106 95(L) 108  CO2 22 - 32 mmol/L 27 24 20(L)  Calcium 8.9 - 10.3 mg/dL 10.1 9.4 9.0  Total Protein 6.5 - 8.1 g/dL 7.5 7.8 -  Total Bilirubin 0.3 - 1.2 mg/dL 0.6 1.2 -  Alkaline Phos 38 - 126  U/L 75 86 -  AST 15 - 41 U/L 10(L) 19 -  ALT 0 - 44 U/L <6 9 -    Lab Results  Component Value Date   MPROTEIN 1.5 (H) 06/10/2020   Lab Results  Component Value Date   KPAFRELGTCHN 20.3 (H) 06/10/2020   KPAFRELGTCHN 4.40 (H) 12/29/2010   LAMBDASER 21.1 06/10/2020   LAMBDASER 6.19 (H) 12/29/2010   KAPLAMBRATIO 4.36 06/17/2020   KAPLAMBRATIO 0.96 06/10/2020   KAPLAMBRATIO 0.71 12/29/2010    RADIOGRAPHIC STUDIES: DG Chest 2 View  Result Date: 07/18/2020 CLINICAL DATA:  Increasing shortness of breath over the past 5 days. EXAM: CHEST - 2 VIEW COMPARISON:  Single-view of the chest 07/01/2020 and 05/27/2020. FINDINGS: The patient has small bilateral pleural effusions, larger on the right. There is cardiomegaly and mild interstitial edema. No pneumothorax. No acute bony abnormality. IMPRESSION: Mild interstitial edema with associated small pleural effusions, larger on the right. Electronically Signed   By: Inge Rise M.D.   On: 07/18/2020 13:49   NM PET Image Restage (PS) Whole Body  Result Date: 08/02/2020 CLINICAL DATA:  Initial treatment strategy for monoclonal gammopathy. EXAM: NUCLEAR MEDICINE PET WHOLE BODY TECHNIQUE: 8.8 mCi F-18 FDG was injected intravenously. Full-ring PET imaging was performed from the head to foot after the radiotracer. CT data was obtained and used for attenuation correction and anatomic localization. Fasting blood glucose: 162 mg/dl COMPARISON:  04/23/2009 and CT scan from 05/14/2020 FINDINGS: Mediastinal blood pool activity: SUV max 3.2 HEAD/NECK: Minimal glottic activity thought to be physiologic. No significant abnormal activity in the head/neck. Incidental CT findings: none CHEST: No significant abnormal hypermetabolic activity in this region. Incidental CT findings: Atherosclerotic calcification of the aortic arch. Mild mitral valve calcifications. Mild cardiomegaly. Small right pleural effusion. No pathologic or hypermetabolic adenopathy. Dilated venous  structures along the left supraclavicular and left shoulder region. Mild atelectasis in both lung bases. ABDOMEN/PELVIS: No significant abnormal hypermetabolic activity in this region. Incidental CT findings: Innumerable cysts of varying complexity throughout both kidneys without accentuated metabolic activity. Scattered photopenic lesions throughout the liver favoring cysts. The spleen measures 12.6 by 7.5 by 14.1 cm (volume = 700 cm^3), compatible splenomegaly. Aortoiliac atherosclerotic vascular disease. Laxity of the right anterior abdominal wall. Small umbilical hernia contains adipose tissue and a small amount of chronic fat necrosis. Transplant kidney along the right iliac fossa. Sigmoid colon diverticulosis. SKELETON: No significant abnormal hypermetabolic activity in this region. Incidental CT findings: Chronic/healed bilateral anterior rib fractures. Sclerosis compatible with prior pelvic fractures including the sacral ala and the left pubic rami. Left hip prosthesis. Bilateral proximal tibial plate and screw fixators. EXTREMITIES: No significant abnormal hypermetabolic activity in this region. Incidental CT findings: Evidence of av graft in the left upper extremity. Subcutaneous edema along the calves, left greater than right. IMPRESSION: 1. No findings of active myeloma. No significant abnormal hypermetabolic activity. 2. Polycystic kidneys with polycystic liver disease. 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 3. Other imaging findings of potential clinical significance: Aortic Atherosclerosis (ICD10-I70.0). Mitral valve calcifications. Mild cardiomegaly. Small right pleural effusion. Small umbilical hernia containing adipose tissue. Sigmoid colon diverticulosis. Prior pelvic and rib fractures. Electronically Signed   By: Van Clines M.D.   On: 08/02/2020 09:49    ASSESSMENT & PLAN VENNESSA AFFINITO 64 y.o. female with medical history significant for smoldering multiple myeloma who  presents for a follow up visit.  After review the labs, review the records, discussion with the patient the findings are most consistent with a low risk smoldering multiple myeloma.  Based on the 20-2-20 criteria the patient does not require treatment for this smoldering myeloma.  Certainly a confounder in this case is the low hemoglobin.  Several the labs have pointed towards low endogenous erythropoietin as the likely cause of this and not the plasma cell dyscrasia.  As such I do not believe that this low hemoglobin is attributable to the plasma cell population and therefore she does not meet crab criteria at this time.  I would recommend continued observation at least every 3 months initially and then eventually every 6 months if found to be stable.  The patient voiced her understanding of these findings and the plan moving forward.  # Smoldering Multiple Myeloma, Low Risk  --PET CT scan is reassuring for no lytic lesions. Based on the 20-2-20 criteria the patient does not require treatment for her smoldering myeloma --the low Hgb and elevated MCV are certainly a confounder in this situation. Given the relative low bone marrow presence and lack of other criteria I strongly suspect this is due  to low endogenous EPO from her transplant rather than from the plasma cells themselves --recommend q 3 month clinic visits initially to monitor the smoldering myeloma. If stable can follow q 6 months instead.   No orders of the defined types were placed in this encounter.   All questions were answered. The patient knows to call the clinic with any problems, questions or concerns.  A total of more than 30 minutes were spent on this encounter and over half of that time was spent on counseling and coordination of care as outlined above.   Ledell Peoples, MD Department of Hematology/Oncology [facility] Phone: [phone] Pager: [phone] Email:  [email or web address]  08/05/2020 5:24 PM

## 2020-08-05 ENCOUNTER — Inpatient Hospital Stay (HOSPITAL_BASED_OUTPATIENT_CLINIC_OR_DEPARTMENT_OTHER): Payer: Medicare Other | Admitting: Hematology and Oncology

## 2020-08-05 ENCOUNTER — Other Ambulatory Visit: Payer: Self-pay

## 2020-08-05 ENCOUNTER — Inpatient Hospital Stay: Payer: Medicare Other | Attending: Hematology and Oncology

## 2020-08-05 ENCOUNTER — Encounter: Payer: Self-pay | Admitting: Hematology and Oncology

## 2020-08-05 VITALS — BP 138/65 | HR 87 | Temp 97.9°F | Resp 18 | Ht 61.0 in | Wt 164.0 lb

## 2020-08-05 DIAGNOSIS — I509 Heart failure, unspecified: Secondary | ICD-10-CM | POA: Diagnosis not present

## 2020-08-05 DIAGNOSIS — D649 Anemia, unspecified: Secondary | ICD-10-CM | POA: Diagnosis not present

## 2020-08-05 DIAGNOSIS — F329 Major depressive disorder, single episode, unspecified: Secondary | ICD-10-CM | POA: Diagnosis not present

## 2020-08-05 DIAGNOSIS — C9000 Multiple myeloma not having achieved remission: Secondary | ICD-10-CM | POA: Insufficient documentation

## 2020-08-05 DIAGNOSIS — I110 Hypertensive heart disease with heart failure: Secondary | ICD-10-CM | POA: Diagnosis not present

## 2020-08-05 DIAGNOSIS — J449 Chronic obstructive pulmonary disease, unspecified: Secondary | ICD-10-CM | POA: Diagnosis not present

## 2020-08-05 DIAGNOSIS — D472 Monoclonal gammopathy: Secondary | ICD-10-CM

## 2020-08-05 DIAGNOSIS — Z87891 Personal history of nicotine dependence: Secondary | ICD-10-CM | POA: Insufficient documentation

## 2020-08-05 DIAGNOSIS — Z79899 Other long term (current) drug therapy: Secondary | ICD-10-CM | POA: Diagnosis not present

## 2020-08-05 LAB — CBC WITH DIFFERENTIAL (CANCER CENTER ONLY)
Abs Immature Granulocytes: 0.05 10*3/uL (ref 0.00–0.07)
Basophils Absolute: 0 10*3/uL (ref 0.0–0.1)
Basophils Relative: 0 %
Eosinophils Absolute: 0.1 10*3/uL (ref 0.0–0.5)
Eosinophils Relative: 1 %
HCT: 28.4 % — ABNORMAL LOW (ref 36.0–46.0)
Hemoglobin: 8.6 g/dL — ABNORMAL LOW (ref 12.0–15.0)
Immature Granulocytes: 1 %
Lymphocytes Relative: 7 %
Lymphs Abs: 0.7 10*3/uL (ref 0.7–4.0)
MCH: 31.2 pg (ref 26.0–34.0)
MCHC: 30.3 g/dL (ref 30.0–36.0)
MCV: 102.9 fL — ABNORMAL HIGH (ref 80.0–100.0)
Monocytes Absolute: 0.2 10*3/uL (ref 0.1–1.0)
Monocytes Relative: 2 %
Neutro Abs: 8 10*3/uL — ABNORMAL HIGH (ref 1.7–7.7)
Neutrophils Relative %: 89 %
Platelet Count: 172 10*3/uL (ref 150–400)
RBC: 2.76 MIL/uL — ABNORMAL LOW (ref 3.87–5.11)
RDW: 20.6 % — ABNORMAL HIGH (ref 11.5–15.5)
WBC Count: 9.1 10*3/uL (ref 4.0–10.5)
nRBC: 0 % (ref 0.0–0.2)

## 2020-08-05 LAB — CMP (CANCER CENTER ONLY)
ALT: <6 U/L (ref 0–44)
AST: 10 U/L — ABNORMAL LOW (ref 15–41)
Albumin: 2.8 g/dL — ABNORMAL LOW (ref 3.5–5.0)
Alkaline Phosphatase: 75 U/L (ref 38–126)
Anion gap: 7 (ref 5–15)
BUN: 45 mg/dL — ABNORMAL HIGH (ref 8–23)
CO2: 27 mmol/L (ref 22–32)
Calcium: 10.1 mg/dL (ref 8.9–10.3)
Chloride: 106 mmol/L (ref 98–111)
Creatinine: 1.3 mg/dL — ABNORMAL HIGH (ref 0.44–1.00)
GFR, Est AFR Am: 51 mL/min — ABNORMAL LOW (ref >60)
GFR, Estimated: 44 mL/min — ABNORMAL LOW (ref >60)
Glucose, Bld: 189 mg/dL — ABNORMAL HIGH (ref 70–99)
Potassium: 4.5 mmol/L (ref 3.5–5.1)
Sodium: 140 mmol/L (ref 135–145)
Total Bilirubin: 0.6 mg/dL (ref 0.3–1.2)
Total Protein: 7.5 g/dL (ref 6.5–8.1)

## 2020-08-05 LAB — LACTATE DEHYDROGENASE: LDH: 111 U/L (ref 98–192)

## 2020-08-06 ENCOUNTER — Telehealth: Payer: Self-pay | Admitting: Hematology and Oncology

## 2020-08-06 LAB — BETA 2 MICROGLOBULIN, SERUM: Beta-2 Microglobulin: 5.3 mg/L — ABNORMAL HIGH (ref 0.6–2.4)

## 2020-08-06 LAB — KAPPA/LAMBDA LIGHT CHAINS
Kappa free light chain: 25.8 mg/L — ABNORMAL HIGH (ref 3.3–19.4)
Kappa, lambda light chain ratio: 1.25 (ref 0.26–1.65)
Lambda free light chains: 20.6 mg/L (ref 5.7–26.3)

## 2020-08-06 NOTE — Telephone Encounter (Signed)
Scheduled per los. Called and left msg. Mailed printout  °

## 2020-08-07 LAB — MULTIPLE MYELOMA PANEL, SERUM
Albumin SerPl Elph-Mcnc: 3.1 g/dL (ref 2.9–4.4)
Albumin/Glob SerPl: 0.9 (ref 0.7–1.7)
Alpha 1: 0.3 g/dL (ref 0.0–0.4)
Alpha2 Glob SerPl Elph-Mcnc: 0.7 g/dL (ref 0.4–1.0)
B-Globulin SerPl Elph-Mcnc: 0.9 g/dL (ref 0.7–1.3)
Gamma Glob SerPl Elph-Mcnc: 1.8 g/dL (ref 0.4–1.8)
Globulin, Total: 3.7 g/dL (ref 2.2–3.9)
IgA: 44 mg/dL — ABNORMAL LOW (ref 87–352)
IgG (Immunoglobin G), Serum: 2021 mg/dL — ABNORMAL HIGH (ref 586–1602)
IgM (Immunoglobulin M), Srm: 30 mg/dL (ref 26–217)
M Protein SerPl Elph-Mcnc: 1.6 g/dL — ABNORMAL HIGH
Total Protein ELP: 6.8 g/dL (ref 6.0–8.5)

## 2020-08-20 ENCOUNTER — Other Ambulatory Visit: Payer: Self-pay

## 2020-08-20 ENCOUNTER — Encounter: Payer: Self-pay | Admitting: Primary Care

## 2020-08-20 ENCOUNTER — Ambulatory Visit (INDEPENDENT_AMBULATORY_CARE_PROVIDER_SITE_OTHER): Payer: Medicare Other | Admitting: Primary Care

## 2020-08-20 ENCOUNTER — Ambulatory Visit: Payer: Medicare Other | Admitting: Pulmonary Disease

## 2020-08-20 VITALS — BP 124/68 | HR 94 | Temp 97.3°F | Ht 61.0 in | Wt 162.3 lb

## 2020-08-20 DIAGNOSIS — R5381 Other malaise: Secondary | ICD-10-CM | POA: Diagnosis not present

## 2020-08-20 DIAGNOSIS — J45901 Unspecified asthma with (acute) exacerbation: Secondary | ICD-10-CM | POA: Diagnosis not present

## 2020-08-20 DIAGNOSIS — J449 Chronic obstructive pulmonary disease, unspecified: Secondary | ICD-10-CM | POA: Diagnosis not present

## 2020-08-20 DIAGNOSIS — I5032 Chronic diastolic (congestive) heart failure: Secondary | ICD-10-CM | POA: Diagnosis not present

## 2020-08-20 MED ORDER — BREZTRI AEROSPHERE 160-9-4.8 MCG/ACT IN AERO
2.0000 | INHALATION_SPRAY | Freq: Two times a day (BID) | RESPIRATORY_TRACT | 0 refills | Status: DC
Start: 2020-08-20 — End: 2020-09-27

## 2020-08-20 MED ORDER — BREZTRI AEROSPHERE 160-9-4.8 MCG/ACT IN AERO: 2.0000 | INHALATION_SPRAY | Freq: Two times a day (BID) | RESPIRATORY_TRACT | 5 refills | Status: AC

## 2020-08-20 NOTE — Progress Notes (Signed)
@Patient  ID: April [name], female    DOB: Jan 27, 1956, 64 y.o.   MRN: [id number]  Chief Complaint  Patient presents with  . Follow-up    Pt states she began to have complaints of SOB and fatigue which began yesterday 08/19/20. Pt also had some complaints of cough but that is better.    Referring provider: [name], MD  HPI: 64 year old female, former smoker quit 1999 (3.75-pack-year history).  Past medical history significant for mild intermittent asthma, COPD, chronic respiratory failure with hypoxia, recurrent bronchitis, hypertension, pericardial effusion, diastolic heart failure, GERD, polycystic kidney disease status post renal transplant 2013, SIRS.  Patient of Dr. Vaughan Browner, last seen in office by pulmonary nurse practitioner on 09/22/2019. Recommended 6 week follow-up  She was recently admitted in the hospital from 8/9-8/11/21 for bandemia.  Chest x-ray on August 9 showed persistent small bilateral pleural effusions with bibasilar airspace disease, favored atelectasis.  Stable cardiac enlargement with vascular congestion. CBC features leukocytosis 21,000 and chronic anemia with hemoglobin 9.7.  Covid and PCR negative.  Lactic acid was normal.  BNP elevated at 558.  She was treated with IV fluids, vancomycin, cefepime and Flagyl.  Patient reported general malaise, poor oral intake. 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 She is maintained on Symbicort 80, prednisone 83m and 3 L of oxygen.   07/16/2020 Patient presents today for regular follow-up office visit.  She was last seen in October 2020. She reports worsening in her breathing. Associated wheezing and chest tightness. She reports compliance with budesonide nebulizer twice daily and she is using ipratropium hfa twice daily (previously on Symbicort 80). She has been taking lasix as prescribed. She missed her apt with oncology d.t illness. Needs to reschedule.    08/20/2020- Interim hx Patient presents today for 1 month follow-up. During last visit she was started on trial Breztri and given prednisone taper for acute exacerbation. She has frequent ED/hospital admissions. Seen in ED on 07/18/20 for COPD exacerbation. She appeared to be fluid overloaded, she admitted to mising a few of her regular lasix doses. BNP mildly elevated at 123. Covid-19 was negative. CXR showed mild interstitial edema with associated small pleural effusion larger on right. She reports compliance with lasix since ED visit. Her weight is up a little today. States that she had a rough day yesterday, she needed to use her Albuterol rescue inhaler twice yesterday. She has a productive cough yesterday but reports it has improved today. She is using Breztri twice daily and feel this has been working very well for her. Yesterday was the first time she had a flare of her COPD symptoms. States that she noticed an improvement in her breathing immediately once she started new inhaler.   Imaging: CT high res 07/27/14 No ILD.   Chest x-ray 06/29/2019-mild vascular congestion, bibasilar atelectasis.   PFTs: 08/08/14 FVC 1.66 [52%] , FEV1 1.35 (57%], F/F 81, RV/TLC elevated. Mild obstructive small airway disease, hyperinflation, air trapping.  10/26/16 FVC 1.41 [47%), FEV1 1.20 (52%), F/F 85, TLC 68%, RV/TLC 127%, DLCO 71% Minimal obstructive airway disease, hyperinflation, mild restriction andDLCOimpairment  09/22/2019-pulmonary function test-FVC 1.33 (46% predicted), postbronchodilator ratio 83, postbronchodilator FEV1 1.19 (53% predicted), positive bronchodilator response in FEV1 as well as mid flow reversibility, DLCO 12.9 (71% predicted)  Allergies  Allergen Reactions  . Infed [Iron Dextran] Other (See Comments)    Chest tightness  . Pentamidine Itching, Shortness Of Breath and Swelling  . Erythromycin [Erythromycin] Other (See Comments)  Mouth Ulcers  . Iohexol Other (See  Comments)    Per patient "she has had a kidney transplant and should never have contrast"  . Oxycodone Nausea Only and Nausea And Vomiting  . Erythromycin Rash    Causes breakout in mouth  . Ultram [Tramadol Hcl] Anxiety    Immunization History  Administered Date(s) Administered  . Influenza Split 09/23/2013, 08/24/2015  . Influenza,inj,Quad PF,6+ Mos 09/23/2016, 09/16/2019, 08/07/2020  . Influenza-Unspecified 07/27/2011, 09/28/2012, 07/25/2015, 07/24/2017  . PFIZER SARS-COV-2 Vaccination 06/21/2020, 07/19/2020  . Pneumococcal Conjugate-13 11/06/2014  . Pneumococcal Polysaccharide-23 08/14/2002, 05/22/2015, 03/05/2020    Past Medical History:  Diagnosis Date  . Arthritis   . Asthma   . CHF (congestive heart failure) ([place])   . COPD (chronic obstructive pulmonary disease) ([place])   . Depression   . Essential hypertension 02/08/2020  . GERD (gastroesophageal reflux disease)   . Hyperlipidemia   . Hyperparathyroidism   . Polycystic kidney   . PONV (postoperative nausea and vomiting)     Tobacco History: Social History   Tobacco Use  Smoking Status Former Smoker  . Packs/day: 0.25  . Years: 15.00  . Pack years: 3.75  . Types: Cigarettes  . Quit date: 03/22/1998  . Years since quitting: 22.4  Smokeless Tobacco Never Used   Counseling given: Not Answered   Outpatient Medications Prior to Visit  Medication Sig Dispense Refill  . acetaminophen (TYLENOL) 325 MG tablet Take 2 tablets (650 mg total) by mouth every 6 (six) hours as needed for mild pain (or Fever >/= 101).    [name] albuterol (VENTOLIN HFA) 108 (90 Base) MCG/ACT inhaler Inhale 2 puffs into the lungs every 4 (four) hours as needed for wheezing or shortness of breath. 18 g 0  . allopurinol (ZYLOPRIM) 300 MG tablet Take 1 tablet (300 mg total) by mouth daily. 30 tablet 0  . ascorbic acid (VITAMIN C) 500 MG tablet Take 500 mg by mouth daily.    [name] aspirin 81 MG chewable tablet Chew 81 mg by mouth daily.    . brimonidine  (ALPHAGAN P) 0.1 % SOLN Place 1 drop into both eyes 2 (two) times daily.     . cholecalciferol (VITAMIN D3) 25 MCG (1000 UNIT) tablet Take 1 tablet (1,000 Units total) by mouth daily. 30 tablet 0  . cyanocobalamin 1000 MCG tablet Take 1,000 mcg by mouth daily.    . cycloSPORINE (RESTASIS) 0.05 % ophthalmic emulsion Place 1 drop into both eyes 2 (two) times daily. 0.4 mL 0  . dorzolamide (TRUSOPT) 2 % ophthalmic solution Place 1 drop into both eyes daily.    . ferrous sulfate 325 (65 FE) MG tablet Take 325 mg by mouth daily with breakfast.    . FLUoxetine (PROZAC) 40 MG capsule Take 1 capsule (40 mg total) by mouth daily. 30 capsule 3  . furosemide (LASIX) 40 MG tablet Take 1 tablet (40 mg total) by mouth daily. 30 tablet 0  . gabapentin (NEURONTIN) 100 MG capsule Take 200 mg by mouth 3 (three) times daily.    . isosorbide mononitrate (IMDUR) 60 MG 24 hr tablet Take 1 tablet (60 mg total) by mouth daily.    [name] levalbuterol (XOPENEX) 0.63 MG/3ML nebulizer solution Take 0.63 mg by nebulization See admin instructions. Inhale 0.21m once daily.  May take 0.64mevery six hours as needed COPD.    . magnesium oxide (MAG-OX) 400 MG tablet Take 1 tablet (400 mg total) by mouth 2 (two) times daily. 60 tablet 0  . Multiple  Vitamin (MULTIVITAMIN WITH MINERALS) TABS tablet Take 1 tablet by mouth daily.    . mycophenolate (MYFORTIC) 180 MG EC tablet Take 180 mg by mouth 2 (two) times daily.    . pantoprazole (PROTONIX) 40 MG tablet Take 1 tablet (40 mg total) by mouth 2 (two) times daily. 60 tablet 0  . predniSONE (DELTASONE) 5 MG tablet Take 7.5 mg by mouth daily with breakfast.    . PROGRAF 1 MG capsule Take 1-2 mg by mouth daily. 50m in the morning 162min the afternoon    . promethazine (PHENERGAN) 25 MG tablet Take 25 mg by mouth 2 (two) times daily as needed.    . sennosides-docusate sodium (SENOKOT-S) 8.6-50 MG tablet Take 1 tablet by mouth daily.     . TRAVATAN Z 0.004 % SOLN ophthalmic solution Place 1  drop into both eyes at bedtime.    . valACYclovir (VALTREX) 500 MG tablet Take 500 mg by mouth 2 (two) times daily.    . verapamil (CALAN-SR) 240 MG CR tablet Take 240 mg by mouth daily.    . predniSONE (DELTASONE) 10 MG tablet Take 4 tabs po daily x 2 days; then 3 tabs for 2 days; then 2 tabs for 2 days; then 1 tab for 2 days 20 tablet 0   No facility-administered medications prior to visit.    Review of Systems  Review of Systems  Constitutional: Positive for unexpected weight change.  Respiratory: Positive for cough. Negative for shortness of breath and wheezing.   Cardiovascular: Positive for leg swelling.    Physical Exam  BP 124/68 (BP Location: Right Arm, Cuff Size: Normal)   Pulse 94   Temp (!) 97.3 F (36.3 C) (Other (Comment)) Comment (Src): wrist  Ht 5' 1"  (1.549 m)   Wt 162 lb 4.8 oz (73.6 kg)   SpO2 95%   BMI 30.67 kg/m  Physical Exam Constitutional:      Appearance: Normal appearance.  HENT:     Right Ear: Tympanic membrane normal. There is no impacted cerumen.     Left Ear: Tympanic membrane normal. There is no impacted cerumen.  Cardiovascular:     Rate and Rhythm: Normal rate and regular rhythm.     Comments: +2 BLE edema/ No JVD Pulmonary:     Effort: Pulmonary effort is normal.     Breath sounds: Normal breath sounds. No wheezing or rhonchi.  Musculoskeletal:     Cervical back: Normal range of motion and neck supple.  Skin:    Comments: Scattered ecchymosis  Neurological:     Mental Status: She is alert.  Psychiatric:        Mood and Affect: Mood normal.        Behavior: Behavior normal.        Thought Content: Thought content normal.        Judgment: Judgment normal.      Lab Results:  CBC    Component Value Date/Time   WBC 9.1 08/05/2020 1523   WBC 5.7 07/18/2020 1545   RBC 2.76 (L) 08/05/2020 1523   HGB 8.6 (L) 08/05/2020 1523   HGB 10.6 (L) 12/29/2010 1522   HCT 28.4 (L) 08/05/2020 1523   HCT 31.2 (L) 12/29/2010 1522   PLT 172  08/05/2020 1523   PLT 205 12/29/2010 1522   MCV 102.9 (H) 08/05/2020 1523   MCV 94 12/29/2010 1522   MCH 31.2 08/05/2020 1523   MCHC 30.3 08/05/2020 1523   RDW 20.6 (H) 08/05/2020 1523   RDW  12.8 12/29/2010 1522   LYMPHSABS 0.7 08/05/2020 1523   LYMPHSABS 1.1 12/29/2010 1522   MONOABS 0.2 08/05/2020 1523   EOSABS 0.1 08/05/2020 1523   EOSABS 0.2 12/29/2010 1522   BASOSABS 0.0 08/05/2020 1523   BASOSABS 0.0 12/29/2010 1522    BMET    Component Value Date/Time   NA 140 08/05/2020 1523   K 4.5 08/05/2020 1523   CL 106 08/05/2020 1523   CO2 27 08/05/2020 1523   GLUCOSE 189 (H) 08/05/2020 1523   BUN 45 (H) 08/05/2020 1523   CREATININE 1.30 (H) 08/05/2020 1523   CALCIUM 10.1 08/05/2020 1523   CALCIUM 10.8 (H) 10/13/2011 1405   GFRNONAA 44 (L) 08/05/2020 1523   GFRAA 51 (L) 08/05/2020 1523    BNP    Component Value Date/Time   BNP 123.4 (H) 07/18/2020 1546    ProBNP No results found for: PROBNP  Imaging: NM PET Image Restage (PS) Whole Body  Result Date: 08/02/2020 CLINICAL DATA:  Initial treatment strategy for monoclonal gammopathy. EXAM: NUCLEAR MEDICINE PET WHOLE BODY TECHNIQUE: 8.8 mCi F-18 FDG was injected intravenously. Full-ring PET imaging was performed from the head to foot after the radiotracer. CT data was obtained and used for attenuation correction and anatomic localization. Fasting blood glucose: 162 mg/dl COMPARISON:  04/23/2009 and CT scan from 05/14/2020 FINDINGS: Mediastinal blood pool activity: SUV max 3.2 HEAD/NECK: Minimal glottic activity thought to be physiologic. No significant abnormal activity in the head/neck. Incidental CT findings: none CHEST: No significant abnormal hypermetabolic activity in this region. Incidental CT findings: Atherosclerotic calcification of the aortic arch. Mild mitral valve calcifications. Mild cardiomegaly. Small right pleural effusion. No pathologic or hypermetabolic adenopathy. Dilated venous structures along the left  supraclavicular and left shoulder region. Mild atelectasis in both lung bases. ABDOMEN/PELVIS: No significant abnormal hypermetabolic activity in this region. Incidental CT findings: Innumerable cysts of varying complexity throughout both kidneys without accentuated metabolic activity. Scattered photopenic lesions throughout the liver favoring cysts. The spleen measures 12.6 by 7.5 by 14.1 cm (volume = 700 cm^3), compatible splenomegaly. Aortoiliac atherosclerotic vascular disease. Laxity of the right anterior abdominal wall. Small umbilical hernia contains adipose tissue and a small amount of chronic fat necrosis. Transplant kidney along the right iliac fossa. Sigmoid colon diverticulosis. SKELETON: No significant abnormal hypermetabolic activity in this region. Incidental CT findings: Chronic/healed bilateral anterior rib fractures. Sclerosis compatible with prior pelvic fractures including the sacral ala and the left pubic rami. Left hip prosthesis. Bilateral proximal tibial plate and screw fixators. EXTREMITIES: No significant abnormal hypermetabolic activity in this region. Incidental CT findings: Evidence of av graft in the left upper extremity. Subcutaneous edema along the calves, left greater than right. IMPRESSION: 1. No findings of active myeloma. No significant abnormal hypermetabolic activity. 2. Polycystic kidneys with polycystic liver disease. 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 3. Other imaging findings of potential clinical significance: Aortic Atherosclerosis (ICD10-I70.0). Mitral valve calcifications. Mild cardiomegaly. Small right pleural effusion. Small umbilical hernia containing adipose tissue. Sigmoid colon diverticulosis. Prior pelvic and rib fractures. Electronically Signed   By: Van Clines M.D.   On: 08/02/2020 09:49     Assessment & Plan:   COPD (chronic obstructive pulmonary disease) ([place]) - Patient noticed immediate improvement in COPD symptoms after starting  Breztri. She had mild breakthrough symptoms yesterday where she required her Albuterol rescue inhaler twice. She is doing much better today. Lungs CTA. O2 95%  - Continue Breztri Aerosphere two puffs twice daily - FU in 2 months with Dr.  Mannam or sooner if needed   Chronic diastolic CHF (congestive heart failure) ([place]) - ED visit on 07/18/20 for COPD exacerbation, felt to be fluid overloaded. BNP 123. Her weight is up 5-7 lbs. +BLE edema. Patient reports compliance with diuretic. Recommend patient continue Lasix 56m daily, she may take additional 435mtablet in the afternoon as needed for weight gain >2 lbs in 24 hours. Sending in RX for compression stockings size medium to DME company.      ElMartyn EhrichNP 08/21/2020

## 2020-08-20 NOTE — Patient Instructions (Addendum)
Recommendations: - Continue Breztri two puffs twice daily (sample today and RX sent) - Continue LASIX 40mg  daily; may take additional tablet in the afternoon for weight gain >2 lbs in 24 hours  - Follow up with nephrology as directed   Orders:  - Compression stockings - Size Medium   Follow-up: - 8 weeks with Dr. Vaughan Browner

## 2020-08-21 ENCOUNTER — Encounter: Payer: Self-pay | Admitting: Primary Care

## 2020-08-21 NOTE — Assessment & Plan Note (Signed)
-   Patient noticed immediate improvement in COPD symptoms after starting Breztri. She had mild breakthrough symptoms yesterday where she required her Albuterol rescue inhaler twice. She is doing much better today. Lungs CTA. O2 95%  - Continue Breztri Aerosphere two puffs twice daily - FU in 2 months with Dr. Vaughan Browner or sooner if needed

## 2020-08-21 NOTE — Assessment & Plan Note (Addendum)
-   ED visit on 07/18/20 for COPD exacerbation, felt to be fluid overloaded. BNP 123. Her weight is up 5-7 lbs. +BLE edema. Patient reports compliance with diuretic. Recommend patient continue Lasix 40mg  daily, she may take additional 40mg  tablet in the afternoon as needed for weight gain >2 lbs in 24 hours. Sending in RX for compression stockings size medium to DME company.

## 2020-09-02 ENCOUNTER — Encounter (HOSPITAL_BASED_OUTPATIENT_CLINIC_OR_DEPARTMENT_OTHER): Payer: Self-pay

## 2020-09-02 ENCOUNTER — Emergency Department (HOSPITAL_BASED_OUTPATIENT_CLINIC_OR_DEPARTMENT_OTHER)
Admission: EM | Admit: 2020-09-02 | Discharge: 2020-09-02 | Disposition: A | Discharge status: Home / Self Care | Payer: Medicare Other | Source: Home / Self Care | Attending: Emergency Medicine | Admitting: Emergency Medicine

## 2020-09-02 ENCOUNTER — Emergency Department (HOSPITAL_BASED_OUTPATIENT_CLINIC_OR_DEPARTMENT_OTHER): Payer: Medicare Other

## 2020-09-02 ENCOUNTER — Other Ambulatory Visit: Payer: Self-pay

## 2020-09-02 DIAGNOSIS — I5033 Acute on chronic diastolic (congestive) heart failure: Secondary | ICD-10-CM | POA: Insufficient documentation

## 2020-09-02 DIAGNOSIS — J45909 Unspecified asthma, uncomplicated: Secondary | ICD-10-CM | POA: Insufficient documentation

## 2020-09-02 DIAGNOSIS — I132 Hypertensive heart and chronic kidney disease with heart failure and with stage 5 chronic kidney disease, or end stage renal disease: Secondary | ICD-10-CM | POA: Insufficient documentation

## 2020-09-02 DIAGNOSIS — Z79899 Other long term (current) drug therapy: Secondary | ICD-10-CM | POA: Insufficient documentation

## 2020-09-02 DIAGNOSIS — K5732 Diverticulitis of large intestine without perforation or abscess without bleeding: Secondary | ICD-10-CM | POA: Insufficient documentation

## 2020-09-02 DIAGNOSIS — A419 Sepsis, unspecified organism: Secondary | ICD-10-CM | POA: Diagnosis not present

## 2020-09-02 DIAGNOSIS — J449 Chronic obstructive pulmonary disease, unspecified: Secondary | ICD-10-CM | POA: Insufficient documentation

## 2020-09-02 DIAGNOSIS — Z7982 Long term (current) use of aspirin: Secondary | ICD-10-CM | POA: Insufficient documentation

## 2020-09-02 DIAGNOSIS — Z96642 Presence of left artificial hip joint: Secondary | ICD-10-CM | POA: Insufficient documentation

## 2020-09-02 DIAGNOSIS — Z87891 Personal history of nicotine dependence: Secondary | ICD-10-CM | POA: Insufficient documentation

## 2020-09-02 DIAGNOSIS — K219 Gastro-esophageal reflux disease without esophagitis: Secondary | ICD-10-CM | POA: Insufficient documentation

## 2020-09-02 DIAGNOSIS — K5792 Diverticulitis of intestine, part unspecified, without perforation or abscess without bleeding: Secondary | ICD-10-CM | POA: Diagnosis not present

## 2020-09-02 DIAGNOSIS — R1032 Left lower quadrant pain: Secondary | ICD-10-CM

## 2020-09-02 DIAGNOSIS — N186 End stage renal disease: Secondary | ICD-10-CM | POA: Insufficient documentation

## 2020-09-02 LAB — COMPREHENSIVE METABOLIC PANEL
ALT: 10 U/L (ref 0–44)
AST: 19 U/L (ref 15–41)
Albumin: 3.4 g/dL — ABNORMAL LOW (ref 3.5–5.0)
Alkaline Phosphatase: 67 U/L (ref 38–126)
Anion gap: 11 (ref 5–15)
BUN: 29 mg/dL — ABNORMAL HIGH (ref 8–23)
CO2: 22 mmol/L (ref 22–32)
Calcium: 9.8 mg/dL (ref 8.9–10.3)
Chloride: 101 mmol/L (ref 98–111)
Creatinine, Ser: 1.2 mg/dL — ABNORMAL HIGH (ref 0.44–1.00)
GFR, Estimated: 48 mL/min — ABNORMAL LOW (ref >60)
Glucose, Bld: 118 mg/dL — ABNORMAL HIGH (ref 70–99)
Potassium: 4.1 mmol/L (ref 3.5–5.1)
Sodium: 134 mmol/L — ABNORMAL LOW (ref 135–145)
Total Bilirubin: 2.1 mg/dL — ABNORMAL HIGH (ref 0.3–1.2)
Total Protein: 8.4 g/dL — ABNORMAL HIGH (ref 6.5–8.1)

## 2020-09-02 LAB — CBC
HCT: 34.7 % — ABNORMAL LOW (ref 36.0–46.0)
Hemoglobin: 10.6 g/dL — ABNORMAL LOW (ref 12.0–15.0)
MCH: 31.3 pg (ref 26.0–34.0)
MCHC: 30.5 g/dL (ref 30.0–36.0)
MCV: 102.4 fL — ABNORMAL HIGH (ref 80.0–100.0)
Platelets: 207 10*3/uL (ref 150–400)
RBC: 3.39 MIL/uL — ABNORMAL LOW (ref 3.87–5.11)
RDW: 19.9 % — ABNORMAL HIGH (ref 11.5–15.5)
WBC: 8 10*3/uL (ref 4.0–10.5)
nRBC: 0 % (ref 0.0–0.2)

## 2020-09-02 LAB — LIPASE, BLOOD: Lipase: 34 U/L (ref 11–51)

## 2020-09-02 MED ORDER — PROCHLORPERAZINE EDISYLATE 10 MG/2ML IJ SOLN
10.0000 mg | Freq: Once | INTRAMUSCULAR | Status: AC
  Administered 2020-09-02: 10 mg via INTRAVENOUS
  Filled 2020-09-02: qty 2

## 2020-09-02 MED ORDER — ONDANSETRON HCL 4 MG/2ML IJ SOLN
4.0000 mg | Freq: Once | INTRAMUSCULAR | Status: DC
  Filled 2020-09-02: qty 2

## 2020-09-02 MED ORDER — OXYCODONE HCL 5 MG PO TABS: 5.0000 mg | ORAL_TABLET | Freq: Four times a day (QID) | ORAL | 0 refills | Status: DC | PRN

## 2020-09-02 MED ORDER — FENTANYL CITRATE (PF) 100 MCG/2ML IJ SOLN
50.0000 ug | Freq: Once | INTRAMUSCULAR | Status: AC
  Administered 2020-09-02: 50 ug via INTRAVENOUS
  Filled 2020-09-02: qty 2

## 2020-09-02 MED ORDER — AMOXICILLIN-POT CLAVULANATE 875-125 MG PO TABS
1.0000 | ORAL_TABLET | Freq: Once | ORAL | Status: AC
  Administered 2020-09-02: 1 via ORAL
  Filled 2020-09-02: qty 1

## 2020-09-02 MED ORDER — PROCHLORPERAZINE EDISYLATE 10 MG/2ML IJ SOLN: 10.0000 mg | Freq: Once | INTRAMUSCULAR | Status: DC

## 2020-09-02 MED ORDER — AMOXICILLIN-POT CLAVULANATE 875-125 MG PO TABS: 1.0000 | ORAL_TABLET | Freq: Two times a day (BID) | ORAL | 0 refills | Status: DC

## 2020-09-02 MED ORDER — PROMETHAZINE HCL 25 MG PO TABS: 25.0000 mg | ORAL_TABLET | Freq: Three times a day (TID) | ORAL | 0 refills | Status: AC | PRN

## 2020-09-02 NOTE — ED Provider Notes (Signed)
[facility] EMERGENCY DEPARTMENT Provider Note   CSN: [id number] Arrival date & time: 09/02/20  2024     History Chief Complaint  Patient presents with  . Abdominal Pain    April STONESIFER is a 64 y.o. female.  The history is provided by the patient.  Abdominal Pain Pain location:  Periumbilical and LLQ Pain quality: cramping   Pain radiates to:  Does not radiate Pain severity:  Mild Onset quality:  Gradual Duration:  2 days Timing:  Intermittent Progression:  Waxing and waning Chronicity:  New Context: previous surgery   Context: not sick contacts and not trauma   Relieved by:  Nothing Worsened by:  Nothing Associated symptoms: nausea and vomiting   Associated symptoms: no anorexia, no belching, no chest pain, no chills, no constipation, no cough, no diarrhea, no dysuria, no fatigue, no fever, no hematemesis, no hematochezia, no hematuria, no shortness of breath and no sore throat        Past Medical History:  Diagnosis Date  . Arthritis   . Asthma   . CHF (congestive heart failure) ([place])   . COPD (chronic obstructive pulmonary disease) ([place])   . Depression   . Essential hypertension 02/08/2020  . GERD (gastroesophageal reflux disease)   . Hyperlipidemia   . Hyperparathyroidism   . Polycystic kidney   . PONV (postoperative nausea and vomiting)     Patient Active Problem List   Diagnosis Date Noted  . SIRS (systemic inflammatory response syndrome) ([place]) 07/02/2020  . Chronic kidney disease, stage 3a ([place])   . 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 History of immunosuppressive therapy 05/27/2020  . FUO (fever of unknown origin) 05/15/2020  . Immunocompromised state ([place])   . Decubitus ulcer of coccyx, unstageable ([place]) 05/01/2020  . Pericardial effusion 04/16/2020  . Chronic respiratory failure with hypoxia ([place]) 04/16/2020  . Proteinuria 04/16/2020  . Pressure injury of skin 04/08/2020  . COPD (chronic obstructive pulmonary disease)  ([place]) 04/07/2020  . SVT (supraventricular tachycardia) ([place])   . Anemia of chronic disease   . Palliative care encounter   . Goals of care, counseling/discussion   . Essential hypertension 02/08/2020  . Glaucoma 02/08/2020  . Fracture of left superior pubic ramus (H[facility])   . Pain   . Anxiety state   . Left displaced femoral neck fracture ([place]) 12/15/2019  . Status post total hip replacement, left   . Post-op pain   . Polycystic kidney   . Closed left hip fracture ([place]) 12/07/2019  . Left wrist fracture 12/07/2019  . Medication management 09/22/2019  . Physical deconditioning 09/22/2019  . Acute respiratory failure with hypoxia ([place]) 04/19/2019  . Acute on chronic diastolic CHF (congestive heart failure) ([place]) 04/19/2019  . Bilateral closed proximal tibial fracture 12/21/2018  . Asthma, chronic, unspecified asthma severity, with acute exacerbation 10/03/2018  . Chronic diastolic CHF (congestive heart failure) ([place]) 10/01/2017  . Asthma, mild intermittent 07/15/2016  . GERD without esophagitis 07/15/2016  . Renal transplant recipient 07/15/2016  . Hyperlipidemia 07/15/2016  . Depression 07/15/2016  . Bronchitis, mucopurulent recurrent ([place]) 08/08/2014  . Chronic cough 07/24/2014  . End stage renal disease ([place]) 03/23/2012  . 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 BILATERAL OOPHORECTOMY    . BIOPSY  05/19/2020   Procedure: BIOPSY;  Surgeon: Carol Ada, MD;  Location:  [facility];  Service: Endoscopy;;  . CARPAL TUNNEL RELEASE  2000  . CESAREAN SECTION    . COLONOSCOPY WITH PROPOFOL N/A 05/19/2020   Procedure: COLONOSCOPY WITH PROPOFOL;  Surgeon: Carol Ada, MD;  Location: [facility];  Service: Endoscopy;  Laterality: N/A;  . ENTEROSCOPY N/A 05/19/2020   Procedure: ENTEROSCOPY;  Surgeon: Carol Ada, MD;   Location: [facility];  Service: Endoscopy;  Laterality: N/A;  . INSERTION OF DIALYSIS CATHETER  03/31/2012   Procedure: INSERTION OF DIALYSIS CATHETER;  Surgeon: Angelia Mould, MD;  Location: [facility];  Service: Vascular;  Laterality: N/A;  insertion of dialysis catheter right internal jugular vein  . 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 ORIF TIBIA FRACTURE Left 12/23/2018   Procedure: OPEN REDUCTION INTERNAL FIXATION (ORIF) TIBIA FRACTURE;  Surgeon: Shona Needles, MD;  Location: [facility];  Service: Orthopedics;  Laterality: Left;  . ORIF TIBIA PLATEAU Right 12/23/2018   Procedure: OPEN REDUCTION INTERNAL FIXATION (ORIF) TIBIAL PLATEAU;  Surgeon: Shona Needles, MD;  Location: [facility];  Service: Orthopedics;  Laterality: Right;  . ORIF WRIST FRACTURE Left 12/08/2019   Procedure: OPEN REDUCTION INTERNAL FIXATION (ORIF) WRIST FRACTURE, REPAIR LACERATION LEFT FOREARM;  Surgeon: Dorna Leitz, MD;  Location: [facility];  Service: Orthopedics;  Laterality: Left;  . TONSILLECTOMY  1967  . TOTAL HIP ARTHROPLASTY Left 12/08/2019   Procedure: TOTAL HIP ARTHROPLASTY ANTERIOR APPROACH;  Surgeon: Dorna Leitz, MD;  Location: [facility];  Service: Orthopedics;  Laterality: Left;  . TUBAL LIGATION  2010     OB History   No obstetric history on file.     Family History  Problem Relation Age of Onset  . Hypertension Mother   . Heart disease Father        CABG history  . Polycystic kidney disease Father   . Polycystic kidney disease Brother     Social History   Tobacco Use  . Smoking status: Former Smoker    Packs/day: 0.25    Years: 15.00    Pack years: 3.75    Types: Cigarettes    Quit date: 03/22/1998    Years since quitting: 22.4  . Smokeless tobacco: Never Used  Vaping Use  . Vaping Use: Never used  Substance Use Topics  . Alcohol use: No  . Drug use: No    Home Medications Prior to Admission medications   Medication Sig Start Date End Date Taking? Authorizing Provider  acetaminophen  (TYLENOL) 325 MG tablet Take 2 tablets (650 mg total) by mouth every 6 (six) hours as needed for mild pain (or Fever >/= 101). 05/22/20   Elgergawy, Silver Huguenin, MD  albuterol (VENTOLIN HFA) 108 (90 Base) MCG/ACT inhaler Inhale 2 puffs into the lungs every 4 (four) hours as needed for wheezing or shortness of breath. 01/04/20   Angiulli, Lavon Paganini, PA-C  allopurinol (ZYLOPRIM) 300 MG tablet Take 1 tablet (300 mg total) by mouth daily. 01/04/20   Angiulli, Lavon Paganini, PA-C  amoxicillin-clavulanate (AUGMENTIN) 875-125 MG tablet Take 1 tablet by mouth every 12 (twelve) hours. 09/02/20   Londyn Hotard, DO  amoxicillin-clavulanate (AUGMENTIN) 875-125 MG tablet Take 1 tablet by mouth 2 (two) times daily for 10 days. 09/02/20 09/12/20  Marie Chow, DO  ascorbic acid (VITAMIN C) 500 MG tablet Take 500 mg by mouth daily.    [provider]  aspirin 81 MG chewable tablet Chew 81 mg by mouth daily.    [provider]  brimonidine (ALPHAGAN P) 0.1 % SOLN  Place 1 drop into both eyes 2 (two) times daily.     [provider]  Budeson-Glycopyrrol-Formoterol (BREZTRI AEROSPHERE) 160-9-4.8 MCG/ACT AERO Inhale 2 puffs into the lungs 2 (two) times daily. 08/20/20   Martyn Ehrich, NP  Budeson-Glycopyrrol-Formoterol (BREZTRI AEROSPHERE) 160-9-4.8 MCG/ACT AERO Inhale 2 puffs into the lungs in the morning and at bedtime. 08/20/20   Martyn Ehrich, NP  cholecalciferol (VITAMIN D3) 25 MCG (1000 UNIT) tablet Take 1 tablet (1,000 Units total) by mouth daily. 01/04/20   Angiulli, Lavon Paganini, PA-C  cyanocobalamin 1000 MCG tablet Take 1,000 mcg by mouth daily.    [provider]  cycloSPORINE (RESTASIS) 0.05 % ophthalmic emulsion Place 1 drop into both eyes 2 (two) times daily. 01/04/20   Angiulli, Lavon Paganini, PA-C  dorzolamide (TRUSOPT) 2 % ophthalmic solution Place 1 drop into both eyes daily. 06/25/19   [provider]  ferrous sulfate 325 (65 FE) MG tablet Take 325 mg by mouth daily with  breakfast.    [provider]  FLUoxetine (PROZAC) 40 MG capsule Take 1 capsule (40 mg total) by mouth daily. 01/04/20   Angiulli, Lavon Paganini, PA-C  furosemide (LASIX) 40 MG tablet Take 1 tablet (40 mg total) by mouth daily. 02/07/20   Raiford Noble Latif, DO  gabapentin (NEURONTIN) 100 MG capsule Take 200 mg by mouth 3 (three) times daily.    [provider]  isosorbide mononitrate (IMDUR) 60 MG 24 hr tablet Take 1 tablet (60 mg total) by mouth daily. 05/23/20   Elgergawy, Silver Huguenin, MD  levalbuterol Penne Lash) 0.63 MG/3ML nebulizer solution Take 0.63 mg by nebulization See admin instructions. Inhale 0.63mg  once daily.  May take 0.63mg  every six hours as needed COPD.    [provider]  magnesium oxide (MAG-OX) 400 MG tablet Take 1 tablet (400 mg total) by mouth 2 (two) times daily. 01/04/20   Angiulli, Lavon Paganini, PA-C  Multiple Vitamin (MULTIVITAMIN WITH MINERALS) TABS tablet Take 1 tablet by mouth daily. 02/24/20   Alma Friendly, MD  mycophenolate (MYFORTIC) 180 MG EC tablet Take 180 mg by mouth 2 (two) times daily.    [provider]  oxyCODONE (ROXICODONE) 5 MG immediate release tablet Take 1 tablet (5 mg total) by mouth every 6 (six) hours as needed for up to 15 doses for breakthrough pain. 09/02/20   Zakaria Fromer, DO  pantoprazole (PROTONIX) 40 MG tablet Take 1 tablet (40 mg total) by mouth 2 (two) times daily. 01/04/20   Angiulli, Lavon Paganini, PA-C  predniSONE (DELTASONE) 5 MG tablet Take 7.5 mg by mouth daily with breakfast.    [provider]  PROGRAF 1 MG capsule Take 1-2 mg by mouth daily. 2mg  in the morning 1mg  in the afternoon 03/22/19   [provider]  promethazine (PHENERGAN) 25 MG tablet Take 25 mg by mouth 2 (two) times daily as needed. 07/05/20   [provider]  promethazine (PHENERGAN) 25 MG tablet Take 1 tablet (25 mg total) by mouth every 8 (eight) hours as needed for up to 15 doses for nausea or vomiting. 09/02/20    Pepper Wyndham, DO  sennosides-docusate sodium (SENOKOT-S) 8.6-50 MG tablet Take 1 tablet by mouth daily.     [provider]  TRAVATAN Z 0.004 % SOLN ophthalmic solution Place 1 drop into both eyes at bedtime. 07/03/16   [provider]  valACYclovir (VALTREX) 500 MG tablet Take 500 mg by mouth 2 (two) times daily.    [provider]  verapamil (  CALAN-SR) 240 MG CR tablet Take 240 mg by mouth daily.    [provider]    Allergies    Ardyth Harps [iron dextran], Pentamidine, Erythromycin [erythromycin], Iohexol, Oxycodone, Erythromycin, and Ultram [tramadol hcl]  Review of Systems   Review of Systems  Constitutional: Negative for chills, fatigue and fever.  HENT: Negative for ear pain and sore throat.   Eyes: Negative for pain and visual disturbance.  Respiratory: Negative for cough and shortness of breath.   Cardiovascular: Negative for chest pain and palpitations.  Gastrointestinal: Positive for abdominal pain, nausea and vomiting. Negative for anorexia, constipation, diarrhea, hematemesis and hematochezia.  Genitourinary: Negative for dysuria and hematuria.  Musculoskeletal: Negative for arthralgias and back pain.  Skin: Negative for color change and rash.  Neurological: Negative for seizures and syncope.  All other systems reviewed and are negative.   Physical Exam Updated Vital Signs  ED Triage Vitals  Enc Vitals Group     BP 09/02/20 2043 (!) 142/75     Pulse Rate 09/02/20 2043 91     Resp 09/02/20 2043 20     Temp 09/02/20 2043 98.5 F (36.9 C)     Temp Source 09/02/20 2043 Oral     SpO2 09/02/20 2043 98 %     Weight --      Height --      Head Circumference --      Peak Flow --      Pain Score 09/02/20 2041 2     Pain Loc --      Pain Edu? --      Excl. in [place]? --     Physical Exam Vitals and nursing note reviewed.  Constitutional:      General: She is not in acute distress.    Appearance: She is well-developed.  HENT:     Head:  Normocephalic and atraumatic.     Mouth/Throat:     Mouth: Mucous membranes are moist.  Eyes:     Extraocular Movements: Extraocular movements intact.     Conjunctiva/sclera: Conjunctivae normal.     Pupils: Pupils are equal, round, and reactive to light.  Cardiovascular:     Rate and Rhythm: Normal rate and regular rhythm.     Heart sounds: Normal heart sounds. No murmur heard.   Pulmonary:     Effort: Pulmonary effort is normal. No respiratory distress.     Breath sounds: Normal breath sounds.  Abdominal:     Palpations: Abdomen is soft.     Tenderness: There is abdominal tenderness in the periumbilical area and left upper quadrant. There is no guarding or rebound.     Hernia: A hernia (no cellulitic changes) is present. Hernia is present in the umbilical area.  Musculoskeletal:     Cervical back: Neck supple.  Skin:    General: Skin is warm and dry.     Capillary Refill: Capillary refill takes less than 2 seconds.  Neurological:     General: No focal deficit present.     Mental Status: She is alert.  Psychiatric:        Mood and Affect: Mood normal.     ED Results / Procedures / Treatments   Labs (all labs ordered are listed, but only abnormal results are displayed) Labs Reviewed  COMPREHENSIVE METABOLIC PANEL - Abnormal; Notable for the following components:      Result Value   Sodium 134 (*)    Glucose, Bld 118 (*)    BUN 29 (*)  Creatinine, Ser 1.20 (*)    Total Protein 8.4 (*)    Albumin 3.4 (*)    Total Bilirubin 2.1 (*)    GFR, Estimated 48 (*)    All other components within normal limits  CBC - Abnormal; Notable for the following components:   RBC 3.39 (*)    Hemoglobin 10.6 (*)    HCT 34.7 (*)    MCV 102.4 (*)    RDW 19.9 (*)    All other components within normal limits  LIPASE, BLOOD    EKG None  Radiology CT ABDOMEN PELVIS WO CONTRAST  Result Date: 09/02/2020 CLINICAL DATA:  64 year old female with concern for bowel obstruction secondary  to umbilical hernia. EXAM: CT ABDOMEN AND PELVIS WITHOUT CONTRAST TECHNIQUE: Multidetector CT imaging of the abdomen and pelvis was performed following the standard protocol without IV contrast. COMPARISON:  CT abdomen pelvis dated 05/14/2020. FINDINGS: Evaluation of this exam is limited in the absence of intravenous contrast. Lower chest: There are bibasilar linear atelectasis/scarring. There is trace right pleural effusion. Coronary vascular calcifications noted. No intra-abdominal free air or free fluid. Hepatobiliary: Multiple liver cysts as seen on the prior CT. No intrahepatic biliary ductal dilatation. There is sludge in the gallbladder. No pericholecystic fluid. Pancreas: Unremarkable. No pancreatic ductal dilatation or surrounding inflammatory changes. Spleen: Normal in size without focal abnormality. Adrenals/Urinary Tract: The adrenal glands are unremarkable. Polycystic kidneys with replaced renal parenchyma by innumerable cysts. Scattered parenchymal calcifications noted. There is no hydronephrosis or obstructing stone. There is a right lower quadrant renal transplant. There is no hydronephrosis or nephrolithiasis of the transplant kidney. No peritransplant fluid collection. The urinary bladder is unremarkable. Stomach/Bowel: There is sigmoid diverticulosis. There is inflammatory changes centered at a sigmoid diverticula consistent with acute diverticulitis. There is no bowel obstruction. Normal appendix. Vascular/Lymphatic: Moderate aortoiliac atherosclerotic disease. The IVC is unremarkable. No portal venous gas. There is no adenopathy. Reproductive: The uterus is retroflexed. No adnexal masses. Other: There is a small fat containing umbilical hernia. There is mild stranding of the herniated fat, likely chronic. Correlation with clinical exam and point tenderness recommended to exclude strangulation/incarceration. No fluid collection. Musculoskeletal: Degenerative changes of the spine. Old left pubic  bone fracture. Left hip arthroplasty. No acute osseous pathology. Bilateral sacral al a insufficiency fractures similar to prior CT. IMPRESSION: 1. Sigmoid diverticulitis.  No diverticular abscess or perforation. 2. No bowel obstruction. Normal appendix. 3. Small fat containing umbilical hernia with mild stranding of the herniated fat, likely chronic. Correlation with clinical exam and point tenderness recommended to exclude strangulation/incarceration. 4. Polycystic kidneys and liver. 5. Aortic Atherosclerosis (ICD10-I70.0). Electronically Signed   By: Anner Crete M.D.   On: 09/02/2020 22:27    Procedures Procedures (including critical care time)  Medications Ordered in ED Medications  fentaNYL (SUBLIMAZE) injection 50 mcg (50 mcg Intravenous Given 09/02/20 2248)  prochlorperazine (COMPAZINE) injection 10 mg (10 mg Intravenous Given 09/02/20 2241)    ED Course  I have reviewed the triage vital signs and the nursing notes.  Pertinent labs & imaging results that were available during my care of the patient were reviewed by me and considered in my medical decision making (see chart for details).    MDM Rules/Calculators/A&P                          TEMPRENCE April [name] is a 64 year old female with history of COPD on chronic 2 L of oxygen, high cholesterol, asthma who presents  to the ED with abdominal pain.  Patient with overall unremarkable vitals.  She has had abdominal pain for the last several days.  Periumbilical and left lower quadrant.  Does have a known fat-containing hernia periumbilical.  Has intermittent pain at this area.  Denies any active nausea, vomiting, diarrhea.  However she has had some nausea.  Denies any trauma.  Denies any urinary symptoms.  She does have a periumbilical hernia on exam that is tender but there is no obvious surrounding cellulitis or redness or fluctuance.  She does have some tenderness in the left lower quadrant on exam as well.  Otherwise exam is  unremarkable.  Will check basic labs, CT scan abdomen pelvis.  Patient given IV fentanyl, IV Compazine for nausea and pain.  Patient with no significant anemia, electrolyte abnormality, kidney injury.  No significant leukocytosis.  Creatinine overall baseline.  She is a renal transplant patient.  CT scan shows sigmoid diverticulitis.  No abscess or perforation.  Appendix is normal.  No bowel obstruction.  Some stranding around her fat-containing hernia that appears more chronic.  She does have some tenderness at this site but also her LLQ. Overall, will start antibiotics for diverticulitis and pain management with Roxicodone for both of these things.  There is no bowel involvement of hernia as well.  Will have her follow up with surgery about hernia. She is able to tolerate p.o. and patient was discharged in the ED with antibiotics and pain medication and recommend follow-up primary care doctor.  This chart was dictated using voice recognition software.  Despite best efforts to proofread,  errors can occur which can change the documentation meaning.   Final Clinical Impression(s) / ED Diagnoses Final diagnoses:  Left lower quadrant abdominal pain  Sigmoid diverticulitis    Rx / DC Orders ED Discharge Orders         Ordered    amoxicillin-clavulanate (AUGMENTIN) 875-125 MG tablet  Every 12 hours        09/02/20 2240    oxyCODONE (ROXICODONE) 5 MG immediate release tablet  Every 6 hours PRN        09/02/20 2301    amoxicillin-clavulanate (AUGMENTIN) 875-125 MG tablet  2 times daily        09/02/20 2301    promethazine (PHENERGAN) 25 MG tablet  Every 8 hours PRN        09/02/20 2301           Lennice Sites, DO 09/02/20 2303

## 2020-09-02 NOTE — ED Triage Notes (Signed)
Pt c/o "hernia pain-dry heaving"-sx x 4 days-NAD-to triage in w/c

## 2020-09-04 ENCOUNTER — Encounter (HOSPITAL_COMMUNITY): Payer: Self-pay

## 2020-09-04 ENCOUNTER — Emergency Department (HOSPITAL_COMMUNITY): Payer: Medicare Other

## 2020-09-04 ENCOUNTER — Inpatient Hospital Stay (HOSPITAL_COMMUNITY)
Admission: EM | Admit: 2020-09-04 | Discharge: 2020-09-12 | DRG: 871 | Disposition: A | Discharge status: Home Health | Payer: Medicare Other | Attending: Family Medicine | Admitting: Family Medicine

## 2020-09-04 ENCOUNTER — Other Ambulatory Visit: Payer: Self-pay

## 2020-09-04 DIAGNOSIS — Q612 Polycystic kidney, adult type: Secondary | ICD-10-CM

## 2020-09-04 DIAGNOSIS — Z8249 Family history of ischemic heart disease and other diseases of the circulatory system: Secondary | ICD-10-CM

## 2020-09-04 DIAGNOSIS — T8619 Other complication of kidney transplant: Secondary | ICD-10-CM | POA: Diagnosis not present

## 2020-09-04 DIAGNOSIS — E876 Hypokalemia: Secondary | ICD-10-CM | POA: Diagnosis present

## 2020-09-04 DIAGNOSIS — Z6830 Body mass index (BMI) 30.0-30.9, adult: Secondary | ICD-10-CM

## 2020-09-04 DIAGNOSIS — R6520 Severe sepsis without septic shock: Secondary | ICD-10-CM | POA: Diagnosis present

## 2020-09-04 DIAGNOSIS — Z20822 Contact with and (suspected) exposure to covid-19: Secondary | ICD-10-CM | POA: Diagnosis present

## 2020-09-04 DIAGNOSIS — J9611 Chronic respiratory failure with hypoxia: Secondary | ICD-10-CM | POA: Diagnosis present

## 2020-09-04 DIAGNOSIS — E871 Hypo-osmolality and hyponatremia: Secondary | ICD-10-CM | POA: Diagnosis present

## 2020-09-04 DIAGNOSIS — E785 Hyperlipidemia, unspecified: Secondary | ICD-10-CM | POA: Diagnosis present

## 2020-09-04 DIAGNOSIS — E7800 Pure hypercholesterolemia, unspecified: Secondary | ICD-10-CM | POA: Diagnosis present

## 2020-09-04 DIAGNOSIS — E2740 Unspecified adrenocortical insufficiency: Secondary | ICD-10-CM | POA: Diagnosis present

## 2020-09-04 DIAGNOSIS — Z9981 Dependence on supplemental oxygen: Secondary | ICD-10-CM

## 2020-09-04 DIAGNOSIS — G9341 Metabolic encephalopathy: Secondary | ICD-10-CM | POA: Diagnosis present

## 2020-09-04 DIAGNOSIS — E162 Hypoglycemia, unspecified: Secondary | ICD-10-CM | POA: Diagnosis present

## 2020-09-04 DIAGNOSIS — G9340 Encephalopathy, unspecified: Secondary | ICD-10-CM | POA: Diagnosis not present

## 2020-09-04 DIAGNOSIS — N179 Acute kidney failure, unspecified: Secondary | ICD-10-CM | POA: Diagnosis present

## 2020-09-04 DIAGNOSIS — Y830 Surgical operation with transplant of whole organ as the cause of abnormal reaction of the patient, or of later complication, without mention of misadventure at the time of the procedure: Secondary | ICD-10-CM | POA: Diagnosis present

## 2020-09-04 DIAGNOSIS — C9000 Multiple myeloma not having achieved remission: Secondary | ICD-10-CM | POA: Diagnosis present

## 2020-09-04 DIAGNOSIS — R809 Proteinuria, unspecified: Secondary | ICD-10-CM | POA: Diagnosis present

## 2020-09-04 DIAGNOSIS — K5792 Diverticulitis of intestine, part unspecified, without perforation or abscess without bleeding: Secondary | ICD-10-CM | POA: Diagnosis present

## 2020-09-04 DIAGNOSIS — N1832 Chronic kidney disease, stage 3b: Secondary | ICD-10-CM | POA: Diagnosis present

## 2020-09-04 DIAGNOSIS — I5032 Chronic diastolic (congestive) heart failure: Secondary | ICD-10-CM | POA: Diagnosis present

## 2020-09-04 DIAGNOSIS — L89322 Pressure ulcer of left buttock, stage 2: Secondary | ICD-10-CM | POA: Diagnosis present

## 2020-09-04 DIAGNOSIS — K625 Hemorrhage of anus and rectum: Secondary | ICD-10-CM | POA: Diagnosis not present

## 2020-09-04 DIAGNOSIS — T380X5A Adverse effect of glucocorticoids and synthetic analogues, initial encounter: Secondary | ICD-10-CM | POA: Diagnosis not present

## 2020-09-04 DIAGNOSIS — E669 Obesity, unspecified: Secondary | ICD-10-CM | POA: Diagnosis present

## 2020-09-04 DIAGNOSIS — K219 Gastro-esophageal reflux disease without esophagitis: Secondary | ICD-10-CM | POA: Diagnosis present

## 2020-09-04 DIAGNOSIS — Z8271 Family history of polycystic kidney: Secondary | ICD-10-CM

## 2020-09-04 DIAGNOSIS — I959 Hypotension, unspecified: Secondary | ICD-10-CM | POA: Diagnosis not present

## 2020-09-04 DIAGNOSIS — Z940 Kidney transplant status: Secondary | ICD-10-CM

## 2020-09-04 DIAGNOSIS — K5732 Diverticulitis of large intestine without perforation or abscess without bleeding: Secondary | ICD-10-CM | POA: Diagnosis present

## 2020-09-04 DIAGNOSIS — M109 Gout, unspecified: Secondary | ICD-10-CM | POA: Diagnosis present

## 2020-09-04 DIAGNOSIS — L89153 Pressure ulcer of sacral region, stage 3: Secondary | ICD-10-CM | POA: Clinically undetermined

## 2020-09-04 DIAGNOSIS — K4090 Unilateral inguinal hernia, without obstruction or gangrene, not specified as recurrent: Secondary | ICD-10-CM | POA: Diagnosis present

## 2020-09-04 DIAGNOSIS — I130 Hypertensive heart and chronic kidney disease with heart failure and stage 1 through stage 4 chronic kidney disease, or unspecified chronic kidney disease: Secondary | ICD-10-CM | POA: Diagnosis present

## 2020-09-04 DIAGNOSIS — Z96642 Presence of left artificial hip joint: Secondary | ICD-10-CM | POA: Diagnosis present

## 2020-09-04 DIAGNOSIS — F32A Depression, unspecified: Secondary | ICD-10-CM | POA: Diagnosis present

## 2020-09-04 DIAGNOSIS — R197 Diarrhea, unspecified: Secondary | ICD-10-CM | POA: Diagnosis present

## 2020-09-04 DIAGNOSIS — D630 Anemia in neoplastic disease: Secondary | ICD-10-CM | POA: Diagnosis present

## 2020-09-04 DIAGNOSIS — R0602 Shortness of breath: Secondary | ICD-10-CM | POA: Diagnosis not present

## 2020-09-04 DIAGNOSIS — Z885 Allergy status to narcotic agent status: Secondary | ICD-10-CM

## 2020-09-04 DIAGNOSIS — K429 Umbilical hernia without obstruction or gangrene: Secondary | ICD-10-CM | POA: Diagnosis present

## 2020-09-04 DIAGNOSIS — E213 Hyperparathyroidism, unspecified: Secondary | ICD-10-CM | POA: Diagnosis present

## 2020-09-04 DIAGNOSIS — D6959 Other secondary thrombocytopenia: Secondary | ICD-10-CM | POA: Diagnosis present

## 2020-09-04 DIAGNOSIS — Z881 Allergy status to other antibiotic agents status: Secondary | ICD-10-CM

## 2020-09-04 DIAGNOSIS — J449 Chronic obstructive pulmonary disease, unspecified: Secondary | ICD-10-CM | POA: Diagnosis present

## 2020-09-04 DIAGNOSIS — D84821 Immunodeficiency due to drugs: Secondary | ICD-10-CM | POA: Diagnosis present

## 2020-09-04 DIAGNOSIS — O85 Puerperal sepsis: Secondary | ICD-10-CM | POA: Diagnosis not present

## 2020-09-04 DIAGNOSIS — R739 Hyperglycemia, unspecified: Secondary | ICD-10-CM | POA: Diagnosis not present

## 2020-09-04 DIAGNOSIS — R4182 Altered mental status, unspecified: Secondary | ICD-10-CM

## 2020-09-04 DIAGNOSIS — Z7982 Long term (current) use of aspirin: Secondary | ICD-10-CM

## 2020-09-04 DIAGNOSIS — N170 Acute kidney failure with tubular necrosis: Secondary | ICD-10-CM | POA: Diagnosis not present

## 2020-09-04 DIAGNOSIS — A419 Sepsis, unspecified organism: Principal | ICD-10-CM

## 2020-09-04 DIAGNOSIS — Z87891 Personal history of nicotine dependence: Secondary | ICD-10-CM

## 2020-09-04 DIAGNOSIS — Z888 Allergy status to other drugs, medicaments and biological substances status: Secondary | ICD-10-CM

## 2020-09-04 DIAGNOSIS — Z79899 Other long term (current) drug therapy: Secondary | ICD-10-CM

## 2020-09-04 DIAGNOSIS — K5720 Diverticulitis of large intestine with perforation and abscess without bleeding: Secondary | ICD-10-CM | POA: Diagnosis present

## 2020-09-04 DIAGNOSIS — R319 Hematuria, unspecified: Secondary | ICD-10-CM | POA: Diagnosis present

## 2020-09-04 LAB — URINALYSIS, ROUTINE W REFLEX MICROSCOPIC
Bilirubin Urine: NEGATIVE
Glucose, UA: NEGATIVE mg/dL
Ketones, ur: 5 mg/dL — AB
Leukocytes,Ua: NEGATIVE
Nitrite: NEGATIVE
Protein, ur: >=300 mg/dL — AB
Specific Gravity, Urine: 1.017 (ref 1.005–1.030)
pH: 5 (ref 5.0–8.0)

## 2020-09-04 LAB — COMPREHENSIVE METABOLIC PANEL
ALT: 10 U/L (ref 0–44)
AST: 21 U/L (ref 15–41)
Albumin: 3.4 g/dL — ABNORMAL LOW (ref 3.5–5.0)
Alkaline Phosphatase: 74 U/L (ref 38–126)
Anion gap: 11 (ref 5–15)
BUN: 30 mg/dL — ABNORMAL HIGH (ref 8–23)
CO2: 21 mmol/L — ABNORMAL LOW (ref 22–32)
Calcium: 9.6 mg/dL (ref 8.9–10.3)
Chloride: 96 mmol/L — ABNORMAL LOW (ref 98–111)
Creatinine, Ser: 1.31 mg/dL — ABNORMAL HIGH (ref 0.44–1.00)
GFR, Estimated: 43 mL/min — ABNORMAL LOW (ref >60)
Glucose, Bld: 112 mg/dL — ABNORMAL HIGH (ref 70–99)
Potassium: 3.7 mmol/L (ref 3.5–5.1)
Sodium: 128 mmol/L — ABNORMAL LOW (ref 135–145)
Total Bilirubin: 2.6 mg/dL — ABNORMAL HIGH (ref 0.3–1.2)
Total Protein: 8.9 g/dL — ABNORMAL HIGH (ref 6.5–8.1)

## 2020-09-04 LAB — RAPID URINE DRUG SCREEN, HOSP PERFORMED
Amphetamines: NOT DETECTED
Barbiturates: NOT DETECTED
Benzodiazepines: NOT DETECTED
Cocaine: NOT DETECTED
Opiates: NOT DETECTED
Tetrahydrocannabinol: NOT DETECTED

## 2020-09-04 LAB — CBC WITH DIFFERENTIAL/PLATELET
Abs Immature Granulocytes: 0.28 10*3/uL — ABNORMAL HIGH (ref 0.00–0.07)
Basophils Absolute: 0 10*3/uL (ref 0.0–0.1)
Basophils Relative: 0 %
Eosinophils Absolute: 0.1 10*3/uL (ref 0.0–0.5)
Eosinophils Relative: 0 %
HCT: 36.8 % (ref 36.0–46.0)
Hemoglobin: 11.3 g/dL — ABNORMAL LOW (ref 12.0–15.0)
Immature Granulocytes: 2 %
Lymphocytes Relative: 7 %
Lymphs Abs: 1.2 10*3/uL (ref 0.7–4.0)
MCH: 31.1 pg (ref 26.0–34.0)
MCHC: 30.7 g/dL (ref 30.0–36.0)
MCV: 101.4 fL — ABNORMAL HIGH (ref 80.0–100.0)
Monocytes Absolute: 0.5 10*3/uL (ref 0.1–1.0)
Monocytes Relative: 3 %
Neutro Abs: 16.1 10*3/uL — ABNORMAL HIGH (ref 1.7–7.7)
Neutrophils Relative %: 88 %
Platelets: 190 10*3/uL (ref 150–400)
RBC: 3.63 MIL/uL — ABNORMAL LOW (ref 3.87–5.11)
RDW: 19.9 % — ABNORMAL HIGH (ref 11.5–15.5)
WBC: 18.3 10*3/uL — ABNORMAL HIGH (ref 4.0–10.5)
nRBC: 0 % (ref 0.0–0.2)

## 2020-09-04 LAB — BLOOD GAS, VENOUS
Acid-base deficit: 3.2 mmol/L — ABNORMAL HIGH (ref 0.0–2.0)
Bicarbonate: 22.8 mmol/L (ref 20.0–28.0)
O2 Saturation: 61 %
Patient temperature: 98.6
pCO2, Ven: 47.6 mmHg (ref 44.0–60.0)
pH, Ven: 7.301 (ref 7.250–7.430)
pO2, Ven: 35.7 mmHg (ref 32.0–45.0)

## 2020-09-04 LAB — AMMONIA: Ammonia: 18 umol/L (ref 9–35)

## 2020-09-04 LAB — CBG MONITORING, ED
Glucose-Capillary: 172 mg/dL — ABNORMAL HIGH (ref 70–99)
Glucose-Capillary: 94 mg/dL (ref 70–99)
Glucose-Capillary: 79 mg/dL (ref 70–99)
Glucose-Capillary: 83 mg/dL (ref 70–99)
Glucose-Capillary: 86 mg/dL (ref 70–99)

## 2020-09-04 LAB — ETHANOL: Alcohol, Ethyl (B): <10 mg/dL (ref <10)

## 2020-09-04 LAB — LACTIC ACID, PLASMA
Lactic Acid, Venous: 1 mmol/L (ref 0.5–1.9)
Lactic Acid, Venous: 0.9 mmol/L (ref 0.5–1.9)

## 2020-09-04 LAB — MRSA PCR SCREENING: MRSA by PCR: NEGATIVE

## 2020-09-04 LAB — FOLATE: Folate: 16.2 ng/mL (ref >5.9)

## 2020-09-04 LAB — GLUCOSE, CAPILLARY
Glucose-Capillary: 113 mg/dL — ABNORMAL HIGH (ref 70–99)
Glucose-Capillary: 143 mg/dL — ABNORMAL HIGH (ref 70–99)

## 2020-09-04 LAB — BRAIN NATRIURETIC PEPTIDE: B Natriuretic Peptide: 1005.3 pg/mL — ABNORMAL HIGH (ref 0.0–100.0)

## 2020-09-04 LAB — POC SARS CORONAVIRUS 2 AG -  ED: SARS Coronavirus 2 Ag: NEGATIVE

## 2020-09-04 LAB — RESPIRATORY PANEL BY RT PCR (FLU A&B, COVID)
Influenza A by PCR: NEGATIVE
Influenza B by PCR: NEGATIVE
SARS Coronavirus 2 by RT PCR: NEGATIVE

## 2020-09-04 LAB — APTT: aPTT: 37 seconds — ABNORMAL HIGH (ref 24–36)

## 2020-09-04 LAB — PROTIME-INR
INR: 1.5 — ABNORMAL HIGH (ref 0.8–1.2)
Prothrombin Time: 17.1 seconds — ABNORMAL HIGH (ref 11.4–15.2)

## 2020-09-04 LAB — TSH: TSH: 1.544 u[IU]/mL (ref 0.350–4.500)

## 2020-09-04 LAB — MAGNESIUM: Magnesium: 1.7 mg/dL (ref 1.7–2.4)

## 2020-09-04 LAB — VITAMIN B12: Vitamin B-12: 2749 pg/mL — ABNORMAL HIGH (ref 180–914)

## 2020-09-04 MED ORDER — TACROLIMUS 1 MG PO CAPS
2.0000 mg | ORAL_CAPSULE | Freq: Every day | ORAL | Status: DC
  Administered 2020-09-05 – 2020-09-12 (×8): 2 mg via ORAL
  Filled 2020-09-04 (×8): qty 2

## 2020-09-04 MED ORDER — ORAL CARE MOUTH RINSE
15.0000 mL | Freq: Two times a day (BID) | OROMUCOSAL | Status: DC
  Administered 2020-09-04 – 2020-09-12 (×16): 15 mL via OROMUCOSAL

## 2020-09-04 MED ORDER — HYDROCORTISONE NA SUCCINATE PF 100 MG IJ SOLR
100.0000 mg | Freq: Once | INTRAMUSCULAR | Status: AC
  Administered 2020-09-04: 100 mg via INTRAVENOUS
  Filled 2020-09-04: qty 2

## 2020-09-04 MED ORDER — METRONIDAZOLE IN NACL 5-0.79 MG/ML-% IV SOLN
500.0000 mg | Freq: Once | INTRAVENOUS | Status: AC
  Administered 2020-09-04: 500 mg via INTRAVENOUS
  Filled 2020-09-04: qty 100

## 2020-09-04 MED ORDER — ONDANSETRON HCL 4 MG PO TABS: 4.0000 mg | ORAL_TABLET | Freq: Four times a day (QID) | ORAL | Status: DC | PRN

## 2020-09-04 MED ORDER — HYDROCORTISONE NA SUCCINATE PF 100 MG IJ SOLR
50.0000 mg | Freq: Four times a day (QID) | INTRAMUSCULAR | Status: DC
  Administered 2020-09-04 – 2020-09-10 (×23): 50 mg via INTRAVENOUS
  Filled 2020-09-04 (×23): qty 2

## 2020-09-04 MED ORDER — ONDANSETRON HCL 4 MG/2ML IJ SOLN: 4.0000 mg | Freq: Four times a day (QID) | INTRAMUSCULAR | Status: DC | PRN

## 2020-09-04 MED ORDER — IPRATROPIUM-ALBUTEROL 0.5-2.5 (3) MG/3ML IN SOLN: 3.0000 mL | Freq: Four times a day (QID) | RESPIRATORY_TRACT | Status: DC | PRN

## 2020-09-04 MED ORDER — ACETAMINOPHEN 325 MG PO TABS
650.0000 mg | ORAL_TABLET | Freq: Four times a day (QID) | ORAL | Status: DC | PRN
  Administered 2020-09-06: 650 mg via ORAL
  Filled 2020-09-04: qty 2

## 2020-09-04 MED ORDER — ENOXAPARIN SODIUM 40 MG/0.4ML ~~LOC~~ SOLN
40.0000 mg | SUBCUTANEOUS | Status: DC
  Administered 2020-09-04 – 2020-09-05 (×2): 40 mg via SUBCUTANEOUS
  Filled 2020-09-04 (×2): qty 0.4

## 2020-09-04 MED ORDER — ASPIRIN 81 MG PO CHEW
81.0000 mg | CHEWABLE_TABLET | Freq: Every day | ORAL | Status: DC
  Administered 2020-09-05 – 2020-09-10 (×6): 81 mg via ORAL
  Filled 2020-09-04 (×6): qty 1

## 2020-09-04 MED ORDER — DEXTROSE 10 % IV SOLN: INTRAVENOUS | Status: AC

## 2020-09-04 MED ORDER — BRIMONIDINE TARTRATE 0.15 % OP SOLN
1.0000 [drp] | Freq: Two times a day (BID) | OPHTHALMIC | Status: DC
  Administered 2020-09-04 – 2020-09-12 (×16): 1 [drp] via OPHTHALMIC
  Filled 2020-09-04: qty 5

## 2020-09-04 MED ORDER — PANTOPRAZOLE SODIUM 40 MG PO TBEC
40.0000 mg | DELAYED_RELEASE_TABLET | Freq: Two times a day (BID) | ORAL | Status: DC
  Administered 2020-09-04 – 2020-09-12 (×16): 40 mg via ORAL
  Filled 2020-09-04 (×16): qty 1

## 2020-09-04 MED ORDER — VERAPAMIL HCL ER 240 MG PO TBCR
240.0000 mg | EXTENDED_RELEASE_TABLET | Freq: Every day | ORAL | Status: DC
  Administered 2020-09-05 – 2020-09-12 (×8): 240 mg via ORAL
  Filled 2020-09-04 (×9): qty 1

## 2020-09-04 MED ORDER — TACROLIMUS 1 MG PO CAPS
1.0000 mg | ORAL_CAPSULE | Freq: Every day | ORAL | Status: DC
  Administered 2020-09-04 – 2020-09-11 (×8): 1 mg via ORAL
  Filled 2020-09-04 (×9): qty 1

## 2020-09-04 MED ORDER — DEXTROSE-NACL 5-0.9 % IV SOLN: INTRAVENOUS | Status: DC

## 2020-09-04 MED ORDER — VALACYCLOVIR HCL 500 MG PO TABS
500.0000 mg | ORAL_TABLET | Freq: Two times a day (BID) | ORAL | Status: DC
  Administered 2020-09-04 – 2020-09-12 (×16): 500 mg via ORAL
  Filled 2020-09-04 (×18): qty 1

## 2020-09-04 MED ORDER — LATANOPROST 0.005 % OP SOLN
1.0000 [drp] | Freq: Every day | OPHTHALMIC | Status: DC
  Administered 2020-09-04 – 2020-09-11 (×8): 1 [drp] via OPHTHALMIC
  Filled 2020-09-04: qty 2.5

## 2020-09-04 MED ORDER — VITAMIN B-12 1000 MCG PO TABS
1000.0000 ug | ORAL_TABLET | Freq: Every day | ORAL | Status: DC
  Administered 2020-09-05 – 2020-09-12 (×8): 1000 ug via ORAL
  Filled 2020-09-04 (×8): qty 1

## 2020-09-04 MED ORDER — GABAPENTIN 100 MG PO CAPS
200.0000 mg | ORAL_CAPSULE | Freq: Three times a day (TID) | ORAL | Status: DC
  Administered 2020-09-04 – 2020-09-12 (×24): 200 mg via ORAL
  Filled 2020-09-04 (×24): qty 2

## 2020-09-04 MED ORDER — ACETAMINOPHEN 650 MG RE SUPP: 650.0000 mg | Freq: Four times a day (QID) | RECTAL | Status: DC | PRN

## 2020-09-04 MED ORDER — CYCLOSPORINE 0.05 % OP EMUL
1.0000 [drp] | Freq: Two times a day (BID) | OPHTHALMIC | Status: DC
  Administered 2020-09-04 – 2020-09-12 (×16): 1 [drp] via OPHTHALMIC
  Filled 2020-09-04 (×17): qty 1

## 2020-09-04 MED ORDER — LACTATED RINGERS IV SOLN: INTRAVENOUS | Status: DC

## 2020-09-04 MED ORDER — TACROLIMUS 1 MG PO CAPS: 1.0000 mg | ORAL_CAPSULE | Freq: Every day | ORAL | Status: DC

## 2020-09-04 MED ORDER — POLYETHYLENE GLYCOL 3350 17 G PO PACK: 17.0000 g | PACK | Freq: Every day | ORAL | Status: DC | PRN

## 2020-09-04 MED ORDER — ALLOPURINOL 300 MG PO TABS
300.0000 mg | ORAL_TABLET | Freq: Every day | ORAL | Status: DC
  Administered 2020-09-05 – 2020-09-09 (×5): 300 mg via ORAL
  Filled 2020-09-04: qty 3
  Filled 2020-09-04 (×4): qty 1

## 2020-09-04 MED ORDER — PIPERACILLIN-TAZOBACTAM 3.375 G IVPB
3.3750 g | Freq: Three times a day (TID) | INTRAVENOUS | Status: DC
  Administered 2020-09-04 – 2020-09-08 (×11): 3.375 g via INTRAVENOUS
  Filled 2020-09-04 (×11): qty 50

## 2020-09-04 MED ORDER — ALBUTEROL SULFATE HFA 108 (90 BASE) MCG/ACT IN AERS
2.0000 | INHALATION_SPRAY | Freq: Once | RESPIRATORY_TRACT | Status: DC
  Filled 2020-09-04: qty 6.7

## 2020-09-04 MED ORDER — SODIUM CHLORIDE 0.9 % IV SOLN
2.0000 g | Freq: Once | INTRAVENOUS | Status: AC
  Administered 2020-09-04: 2 g via INTRAVENOUS
  Filled 2020-09-04: qty 20

## 2020-09-04 MED ORDER — ASCORBIC ACID 500 MG PO TABS
500.0000 mg | ORAL_TABLET | Freq: Every day | ORAL | Status: DC
  Administered 2020-09-05 – 2020-09-12 (×8): 500 mg via ORAL
  Filled 2020-09-04 (×8): qty 1

## 2020-09-04 MED ORDER — BUDESON-GLYCOPYRROL-FORMOTEROL 160-9-4.8 MCG/ACT IN AERO: 2.0000 | INHALATION_SPRAY | Freq: Two times a day (BID) | RESPIRATORY_TRACT | Status: DC

## 2020-09-04 MED ORDER — LACTATED RINGERS IV BOLUS (SEPSIS)
500.0000 mL | Freq: Once | INTRAVENOUS | Status: AC
  Administered 2020-09-04: 500 mL via INTRAVENOUS

## 2020-09-04 MED ORDER — CHLORHEXIDINE GLUCONATE CLOTH 2 % EX PADS
6.0000 | MEDICATED_PAD | Freq: Every day | CUTANEOUS | Status: DC
  Administered 2020-09-05: 6 via TOPICAL

## 2020-09-04 MED ORDER — DORZOLAMIDE HCL 2 % OP SOLN
1.0000 [drp] | Freq: Every day | OPHTHALMIC | Status: DC
  Administered 2020-09-04 – 2020-09-12 (×9): 1 [drp] via OPHTHALMIC
  Filled 2020-09-04: qty 10

## 2020-09-04 MED ORDER — ALBUTEROL SULFATE HFA 108 (90 BASE) MCG/ACT IN AERS
2.0000 | INHALATION_SPRAY | RESPIRATORY_TRACT | Status: DC | PRN
  Filled 2020-09-04: qty 6.7

## 2020-09-04 MED ORDER — VITAMIN D 25 MCG (1000 UNIT) PO TABS
1000.0000 [IU] | ORAL_TABLET | Freq: Every day | ORAL | Status: DC
  Administered 2020-09-05 – 2020-09-12 (×8): 1000 [IU] via ORAL
  Filled 2020-09-04 (×8): qty 1

## 2020-09-04 MED ORDER — SODIUM CHLORIDE 0.9 % IV SOLN: INTRAVENOUS | Status: AC

## 2020-09-04 MED ORDER — MYCOPHENOLATE SODIUM 180 MG PO TBEC
180.0000 mg | DELAYED_RELEASE_TABLET | Freq: Two times a day (BID) | ORAL | Status: DC
  Administered 2020-09-04 – 2020-09-12 (×16): 180 mg via ORAL
  Filled 2020-09-04 (×17): qty 1

## 2020-09-04 NOTE — ED Provider Notes (Signed)
[facility] DEPT Provider Note   CSN: [id number] Arrival date & time: 09/04/20  1308    History Chief Complaint  Patient presents with  . Shortness of Breath  . Hypoglycemia  . Altered Mental Status    April [name] is a 64 y.o. female with past medical history significant for CHF, COPD on chronic 2 L, hypertension, GERD, polycystic kidney s/p transplant who presents via EMS.  EMS states CBG read- LOW, unable to read on glucometer. Admin dextrose 10 with improvement in mentation.  Patient's only complaint is pain to her left lower abdomen.  She was recently diagnosed with diverticulitis and started on Augmentin, Phenergan and Norco 2 days ago.   Patient will arouse and state that she has had worsening abd pain and emesis at home since her last ED visit 2 days ago. No diarrhea, melena or BRBPR. Denies SOB Has Chronic cough. When arouses alert to name, DOB and place however quickly falls back to sleep. Felt febrile at home however has not taken temp.  Level 5 caveat-Altered mental status  HPI     Past Medical History:  Diagnosis Date  . Arthritis   . Asthma   . CHF (congestive heart failure) ([place])   . COPD (chronic obstructive pulmonary disease) ([place])   . Depression   . Essential hypertension 02/08/2020  . GERD (gastroesophageal reflux disease)   . Hyperlipidemia   . Hyperparathyroidism   . Polycystic kidney   . PONV (postoperative nausea and vomiting)     Patient Active Problem List   Diagnosis Date Noted  . SIRS (systemic inflammatory response syndrome) ([place]) 07/02/2020  . Chronic kidney disease, stage 3a ([place])   . 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 History of immunosuppressive therapy 05/27/2020  . FUO (fever of unknown origin) 05/15/2020  . Immunocompromised state ([place])   . Decubitus ulcer of coccyx, unstageable ([place]) 05/01/2020  . Pericardial effusion 04/16/2020  . Chronic respiratory failure with hypoxia ([place])  04/16/2020  . Proteinuria 04/16/2020  . Pressure injury of skin 04/08/2020  . COPD (chronic obstructive pulmonary disease) ([place]) 04/07/2020  . SVT (supraventricular tachycardia) ([place])   . Anemia of chronic disease   . Palliative care encounter   . Goals of care, counseling/discussion   . Essential hypertension 02/08/2020  . Glaucoma 02/08/2020  . Fracture of left superior pubic ramus (H[facility])   . Pain   . Anxiety state   . Left displaced femoral neck fracture ([place]) 12/15/2019  . Status post total hip replacement, left   . Post-op pain   . Polycystic kidney   . Closed left hip fracture ([place]) 12/07/2019  . Left wrist fracture 12/07/2019  . Medication management 09/22/2019  . Physical deconditioning 09/22/2019  . Acute respiratory failure with hypoxia ([place]) 04/19/2019  . Acute on chronic diastolic CHF (congestive heart failure) ([place]) 04/19/2019  . Bilateral closed proximal tibial fracture 12/21/2018  . Asthma, chronic, unspecified asthma severity, with acute exacerbation 10/03/2018  . Chronic diastolic CHF (congestive heart failure) ([place]) 10/01/2017  . Asthma, mild intermittent 07/15/2016  . GERD without esophagitis 07/15/2016  . Renal transplant recipient 07/15/2016  . Hyperlipidemia 07/15/2016  . Depression 07/15/2016  . Bronchitis, mucopurulent recurrent ([place]) 08/08/2014  . Chronic cough 07/24/2014  . End stage renal disease ([place]) 03/23/2012  . 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  BILATERAL OOPHORECTOMY    . BIOPSY  05/19/2020   Procedure: BIOPSY;  Surgeon: Carol Ada, MD;  Location: [facility] ENDOSCOPY;  Service: Endoscopy;;  . CARPAL TUNNEL RELEASE  2000  . CESAREAN SECTION    . COLONOSCOPY WITH PROPOFOL N/A 05/19/2020   Procedure: COLONOSCOPY WITH PROPOFOL;  Surgeon: Carol Ada, MD;  Location: [facility];   Service: Endoscopy;  Laterality: N/A;  . ENTEROSCOPY N/A 05/19/2020   Procedure: ENTEROSCOPY;  Surgeon: Carol Ada, MD;  Location: [facility];  Service: Endoscopy;  Laterality: N/A;  . INSERTION OF DIALYSIS CATHETER  03/31/2012   Procedure: INSERTION OF DIALYSIS CATHETER;  Surgeon: Angelia Mould, MD;  Location: [facility];  Service: Vascular;  Laterality: N/A;  insertion of dialysis catheter right internal jugular vein  . 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 ORIF TIBIA FRACTURE Left 12/23/2018   Procedure: OPEN REDUCTION INTERNAL FIXATION (ORIF) TIBIA FRACTURE;  Surgeon: Shona Needles, MD;  Location: [facility];  Service: Orthopedics;  Laterality: Left;  . ORIF TIBIA PLATEAU Right 12/23/2018   Procedure: OPEN REDUCTION INTERNAL FIXATION (ORIF) TIBIAL PLATEAU;  Surgeon: Shona Needles, MD;  Location: [facility];  Service: Orthopedics;  Laterality: Right;  . ORIF WRIST FRACTURE Left 12/08/2019   Procedure: OPEN REDUCTION INTERNAL FIXATION (ORIF) WRIST FRACTURE, REPAIR LACERATION LEFT FOREARM;  Surgeon: Dorna Leitz, MD;  Location: [facility];  Service: Orthopedics;  Laterality: Left;  . TONSILLECTOMY  1967  . TOTAL HIP ARTHROPLASTY Left 12/08/2019   Procedure: TOTAL HIP ARTHROPLASTY ANTERIOR APPROACH;  Surgeon: Dorna Leitz, MD;  Location: [facility];  Service: Orthopedics;  Laterality: Left;  . TUBAL LIGATION  2010     OB History   No obstetric history on file.     Family History  Problem Relation Age of Onset  . Hypertension Mother   . Heart disease Father        CABG history  . Polycystic kidney disease Father   . Polycystic kidney disease Brother     Social History   Tobacco Use  . Smoking status: Former Smoker    Packs/day: 0.25    Years: 15.00    Pack years: 3.75    Types: Cigarettes    Quit date: 03/22/1998    Years since quitting: 22.4  . Smokeless tobacco: Never Used  Vaping Use  . Vaping Use: Never used  Substance Use Topics  . Alcohol use: No  . Drug use: No    Home  Medications Prior to Admission medications   Medication Sig Start Date End Date Taking? Authorizing Provider  acetaminophen (TYLENOL) 325 MG tablet Take 2 tablets (650 mg total) by mouth every 6 (six) hours as needed for mild pain (or Fever >/= 101). 05/22/20   Elgergawy, Silver Huguenin, MD  albuterol (VENTOLIN HFA) 108 (90 Base) MCG/ACT inhaler Inhale 2 puffs into the lungs every 4 (four) hours as needed for wheezing or shortness of breath. 01/04/20   Angiulli, Lavon Paganini, PA-C  allopurinol (ZYLOPRIM) 300 MG tablet Take 1 tablet (300 mg total) by mouth daily. 01/04/20   Angiulli, Lavon Paganini, PA-C  amoxicillin-clavulanate (AUGMENTIN) 875-125 MG tablet Take 1 tablet by mouth every 12 (twelve) hours. 09/02/20   Curatolo, Adam, DO  amoxicillin-clavulanate (AUGMENTIN) 875-125 MG tablet Take 1 tablet by mouth 2 (two) times daily for 10 days. 09/02/20 09/12/20  Curatolo, Adam, DO  ascorbic acid (VITAMIN C) 500 MG tablet Take 500 mg by mouth daily.    [provider]  aspirin 81 MG chewable  tablet Chew 81 mg by mouth daily.    [provider]  brimonidine (ALPHAGAN P) 0.1 % SOLN Place 1 drop into both eyes 2 (two) times daily.     [provider]  Budeson-Glycopyrrol-Formoterol (BREZTRI AEROSPHERE) 160-9-4.8 MCG/ACT AERO Inhale 2 puffs into the lungs 2 (two) times daily. 08/20/20   Martyn Ehrich, NP  Budeson-Glycopyrrol-Formoterol (BREZTRI AEROSPHERE) 160-9-4.8 MCG/ACT AERO Inhale 2 puffs into the lungs in the morning and at bedtime. 08/20/20   Martyn Ehrich, NP  cholecalciferol (VITAMIN D3) 25 MCG (1000 UNIT) tablet Take 1 tablet (1,000 Units total) by mouth daily. 01/04/20   Angiulli, Lavon Paganini, PA-C  cyanocobalamin 1000 MCG tablet Take 1,000 mcg by mouth daily.    [provider]  cycloSPORINE (RESTASIS) 0.05 % ophthalmic emulsion Place 1 drop into both eyes 2 (two) times daily. 01/04/20   Angiulli, Lavon Paganini, PA-C  dorzolamide (TRUSOPT) 2 % ophthalmic solution Place 1 drop  into both eyes daily. 06/25/19   [provider]  ferrous sulfate 325 (65 FE) MG tablet Take 325 mg by mouth daily with breakfast.    [provider]  FLUoxetine (PROZAC) 40 MG capsule Take 1 capsule (40 mg total) by mouth daily. 01/04/20   Angiulli, Lavon Paganini, PA-C  furosemide (LASIX) 40 MG tablet Take 1 tablet (40 mg total) by mouth daily. 02/07/20   Raiford Noble Latif, DO  gabapentin (NEURONTIN) 100 MG capsule Take 200 mg by mouth 3 (three) times daily.    [provider]  isosorbide mononitrate (IMDUR) 60 MG 24 hr tablet Take 1 tablet (60 mg total) by mouth daily. 05/23/20   Elgergawy, Silver Huguenin, MD  levalbuterol Penne Lash) 0.63 MG/3ML nebulizer solution Take 0.63 mg by nebulization See admin instructions. Inhale 0.63mg  once daily.  May take 0.63mg  every six hours as needed COPD.    [provider]  magnesium oxide (MAG-OX) 400 MG tablet Take 1 tablet (400 mg total) by mouth 2 (two) times daily. 01/04/20   Angiulli, Lavon Paganini, PA-C  Multiple Vitamin (MULTIVITAMIN WITH MINERALS) TABS tablet Take 1 tablet by mouth daily. 02/24/20   Alma Friendly, MD  mycophenolate (MYFORTIC) 180 MG EC tablet Take 180 mg by mouth 2 (two) times daily.    [provider]  oxyCODONE (ROXICODONE) 5 MG immediate release tablet Take 1 tablet (5 mg total) by mouth every 6 (six) hours as needed for up to 15 doses for breakthrough pain. 09/02/20   Curatolo, Adam, DO  pantoprazole (PROTONIX) 40 MG tablet Take 1 tablet (40 mg total) by mouth 2 (two) times daily. 01/04/20   Angiulli, Lavon Paganini, PA-C  predniSONE (DELTASONE) 5 MG tablet Take 7.5 mg by mouth daily with breakfast.    [provider]  PROGRAF 1 MG capsule Take 1-2 mg by mouth daily. 2mg  in the morning 1mg  in the afternoon 03/22/19   [provider]  promethazine (PHENERGAN) 25 MG tablet Take 25 mg by mouth 2 (two) times daily as needed. 07/05/20   [provider]  promethazine (PHENERGAN) 25 MG tablet  Take 1 tablet (25 mg total) by mouth every 8 (eight) hours as needed for up to 15 doses for nausea or vomiting. 09/02/20   Curatolo, Adam, DO  sennosides-docusate sodium (SENOKOT-S) 8.6-50 MG tablet Take 1 tablet by mouth daily.     [provider]  TRAVATAN Z 0.004 % SOLN ophthalmic solution Place 1 drop into both eyes at bedtime. 07/03/16   [provider]  valACYclovir Estell Harpin)  500 MG tablet Take 500 mg by mouth 2 (two) times daily.    [provider]  verapamil (CALAN-SR) 240 MG CR tablet Take 240 mg by mouth daily.    [provider]    Allergies    Ardyth Harps [iron dextran], Pentamidine, Erythromycin [erythromycin], Iohexol, Oxycodone, Erythromycin, and Ultram [tramadol hcl]  Review of Systems   Review of Systems  Unable to perform ROS: Mental status change  Respiratory: Positive for cough (Chronic).   Gastrointestinal: Positive for abdominal pain and nausea. Negative for diarrhea and vomiting.  All other systems reviewed and are negative.   Physical Exam Updated Vital Signs BP 140/78   Pulse 95   Temp 98.4 F (36.9 C) (Oral)   Resp (!) 22   Ht 5\' 1"  (1.549 m)   Wt 73 kg   SpO2 100%   BMI 30.41 kg/m   Physical Exam Vitals and nursing note reviewed.  Constitutional:      General: She is not in acute distress.    Appearance: She is well-developed. She is obese. She is ill-appearing. She is not toxic-appearing.  HENT:     Head: Normocephalic and atraumatic.     Mouth/Throat:     Pharynx: Oropharynx is clear.  Eyes:     Pupils: Pupils are equal, round, and reactive to light.  Cardiovascular:     Rate and Rhythm: Tachycardia present.     Pulses: Normal pulses.     Heart sounds: Normal heart sounds.  Pulmonary:     Effort: Pulmonary effort is normal. Tachypnea present. No respiratory distress.     Breath sounds: Wheezing present.     Comments: Mild expiratory wheeze Chest:     Comments: No crepitus, step offs Abdominal:     General:  Bowel sounds are normal. There is no distension.     Palpations: Abdomen is soft.     Tenderness: There is abdominal tenderness in the left lower quadrant. There is guarding. There is no rebound.     Hernia: No hernia is present.     Comments: Tenderness with guarding to LLQ. No midline pulsatile abd wall mass  Musculoskeletal:        General: Normal range of motion.     Cervical back: Normal range of motion.     Comments: Moves all 4 extremities.  Compartments soft  Skin:    General: Skin is warm and dry.     Capillary Refill: Capillary refill takes 2 to 3 seconds.     Comments: Ecchymosis to bilateral dorsal hands from prior IV placement.  No erythema, warmth  Neurological:     Mental Status: She is lethargic, disoriented and confused.     Cranial Nerves: Cranial nerves are intact.     Comments: Intermittently arouses and is alert to person, place however quickly falls back to sleep.  No facial droop.  Tongue midline. Moves all 4 extremities equally    ED Results / Procedures / Treatments   Labs (all labs ordered are listed, but only abnormal results are displayed) Labs Reviewed  COMPREHENSIVE METABOLIC PANEL - Abnormal; Notable for the following components:      Result Value   Sodium 128 (*)    Chloride 96 (*)    CO2 21 (*)    Glucose, Bld 112 (*)    BUN 30 (*)    Creatinine, Ser 1.31 (*)    Total Protein 8.9 (*)    Albumin 3.4 (*)    Total Bilirubin 2.6 (*)  GFR, Estimated 43 (*)    All other components within normal limits  CBC WITH DIFFERENTIAL/PLATELET - Abnormal; Notable for the following components:   WBC 18.3 (*)    RBC 3.63 (*)    Hemoglobin 11.3 (*)    MCV 101.4 (*)    RDW 19.9 (*)    Neutro Abs 16.1 (*)    Abs Immature Granulocytes 0.28 (*)    All other components within normal limits  PROTIME-INR - Abnormal; Notable for the following components:   Prothrombin Time 17.1 (*)    INR 1.5 (*)    All other components within normal limits  APTT - Abnormal;  Notable for the following components:   aPTT 37 (*)    All other components within normal limits  BLOOD GAS, VENOUS - Abnormal; Notable for the following components:   Acid-base deficit 3.2 (*)    All other components within normal limits  CBG MONITORING, ED - Abnormal; Notable for the following components:   Glucose-Capillary 172 (*)    All other components within normal limits  CULTURE, BLOOD (ROUTINE X 2)  CULTURE, BLOOD (ROUTINE X 2)  URINE CULTURE  RESPIRATORY PANEL BY RT PCR (FLU A&B, COVID)  LACTIC ACID, PLASMA  AMMONIA  MAGNESIUM  LACTIC ACID, PLASMA  URINALYSIS, ROUTINE W REFLEX MICROSCOPIC  RAPID URINE DRUG SCREEN, HOSP PERFORMED  POC SARS CORONAVIRUS 2 AG -  ED  CBG MONITORING, ED    EKG EKG Interpretation  Date/Time:  Wednesday September 04 2020 13:36:04 EDT Ventricular Rate:  103 PR Interval:    QRS Duration: 94 QT Interval:  423 QTC Calculation: 554 R Axis:   116 Text Interpretation: Right and left arm electrode reversal, interpretation assumes no reversal Sinus tachycardia Atrial premature complexes Right axis deviation Abnormal T, consider ischemia, lateral leads Prolonged QT interval Confirmed by Quintella Reichert [phone]) on 09/04/2020 1:49:59 PM   Radiology CT ABDOMEN PELVIS WO CONTRAST  Result Date: 09/02/2020 CLINICAL DATA:  64 year old female with concern for bowel obstruction secondary to umbilical hernia. EXAM: CT ABDOMEN AND PELVIS WITHOUT CONTRAST TECHNIQUE: Multidetector CT imaging of the abdomen and pelvis was performed following the standard protocol without IV contrast. COMPARISON:  CT abdomen pelvis dated 05/14/2020. FINDINGS: Evaluation of this exam is limited in the absence of intravenous contrast. Lower chest: There are bibasilar linear atelectasis/scarring. There is trace right pleural effusion. Coronary vascular calcifications noted. No intra-abdominal free air or free fluid. Hepatobiliary: Multiple liver cysts as seen on the prior CT. No  intrahepatic biliary ductal dilatation. There is sludge in the gallbladder. No pericholecystic fluid. Pancreas: Unremarkable. No pancreatic ductal dilatation or surrounding inflammatory changes. Spleen: Normal in size without focal abnormality. Adrenals/Urinary Tract: The adrenal glands are unremarkable. Polycystic kidneys with replaced renal parenchyma by innumerable cysts. Scattered parenchymal calcifications noted. There is no hydronephrosis or obstructing stone. There is a right lower quadrant renal transplant. There is no hydronephrosis or nephrolithiasis of the transplant kidney. No peritransplant fluid collection. The urinary bladder is unremarkable. Stomach/Bowel: There is sigmoid diverticulosis. There is inflammatory changes centered at a sigmoid diverticula consistent with acute diverticulitis. There is no bowel obstruction. Normal appendix. Vascular/Lymphatic: Moderate aortoiliac atherosclerotic disease. The IVC is unremarkable. No portal venous gas. There is no adenopathy. Reproductive: The uterus is retroflexed. No adnexal masses. Other: There is a small fat containing umbilical hernia. There is mild stranding of the herniated fat, likely chronic. Correlation with clinical exam and point tenderness recommended to exclude strangulation/incarceration. No fluid collection. Musculoskeletal: Degenerative changes of the  spine. Old left pubic bone fracture. Left hip arthroplasty. No acute osseous pathology. Bilateral sacral al a insufficiency fractures similar to prior CT. IMPRESSION: 1. Sigmoid diverticulitis.  No diverticular abscess or perforation. 2. No bowel obstruction. Normal appendix. 3. Small fat containing umbilical hernia with mild stranding of the herniated fat, likely chronic. Correlation with clinical exam and point tenderness recommended to exclude strangulation/incarceration. 4. Polycystic kidneys and liver. 5. Aortic Atherosclerosis (ICD10-I70.0). Electronically Signed   By: Anner Crete  M.D.   On: 09/02/2020 22:27   DG Chest Port 1 View  Result Date: 09/04/2020 CLINICAL DATA:  Questionable sepsis.  Evaluate for abnormality. EXAM: PORTABLE CHEST 1 VIEW COMPARISON:  Multiple priors, most recent 07/18/2020 FINDINGS: Cardiac silhouette is similar to priors and accentuated by low lung volumes and portable technique. Low lung volumes with streaky bibasilar opacities. No visible pleural effusions or pneumothorax. No acute osseous abnormality. IMPRESSION: Low lung volumes with streaky bibasilar opacities, favor atelectasis. Electronically Signed   By: Margaretha Sheffield MD   On: 09/04/2020 14:57    Procedures .Critical Care Performed by: Nettie Elm, PA-C Authorized by: Nettie Elm, PA-C   Critical care provider statement:    Critical care time (minutes):  45   Critical care was necessary to treat or prevent imminent or life-threatening deterioration of the following conditions:  Sepsis   Critical care was time spent personally by me on the following activities:  Discussions with consultants, evaluation of patient's response to treatment, examination of patient, ordering and performing treatments and interventions, ordering and review of laboratory studies, ordering and review of radiographic studies, pulse oximetry, re-evaluation of patient's condition, obtaining history from patient or surrogate and review of old charts   (including critical care time)  Medications Ordered in ED Medications  lactated ringers infusion ( Intravenous Stopped 09/04/20 1446)  metroNIDAZOLE (FLAGYL) IVPB 500 mg (500 mg Intravenous New Bag/Given 09/04/20 1446)  albuterol (VENTOLIN HFA) 108 (90 Base) MCG/ACT inhaler 2 puff (2 puffs Inhalation Not Given 09/04/20 1435)  dextrose 5 %-0.9 % sodium chloride infusion ( Intravenous New Bag/Given 09/04/20 1449)  cefTRIAXone (ROCEPHIN) 2 g in sodium chloride 0.9 % 100 mL IVPB (0 g Intravenous Stopped 09/04/20 1445)  lactated ringers bolus 500 mL (0  mLs Intravenous Stopped 09/04/20 1445)    ED Course  I have reviewed the triage vital signs and the nursing notes.  Pertinent labs & imaging results that were available during my care of the patient were reviewed by me and considered in my medical decision making (see chart for details).  64 year old presents for evaluation of altered mental status.  Seen in ED 2 days ago started on antibiotics for diverticulitis.  Apparently when EMS arrived at her house blood sugar was unreadable per EMS.  She was given dextrose and had improved mentation.  On arrival to emergency department she is tachycardic and tachypneic.  Does have some mild wheeze.  Tenderness to left lower quadrant with guarding.  She does not appear grossly fluid overloaded.  She is on her home oxygen.  Will awaken however quickly falls back to sleep.  Last Norco this morning.  Code sepsis called given abnormal vital signs, known source of infection. Abx for intra abd source given recent dx of diverticulitis.  Labs and imaging personally reviewed and interpreted:  CBC leukocytosis at 18 up from previous at 8 Repeat CBG 94, will add on maintenance fluids with D5>>> No DM hx or DM meds VBG without CO2 retention, no acidosis Ammonia 18  Magnesium 1.7 Lactic acid 1.0 Metabolic panel with hyponatremia, CBG 112, creatinine 1.31, T bili 2.6 DG chest with low lung volumes, no infiltrates  Patient will need to admitted for altered mental status, electrolyte abnormalities, sepsis likely cause due to diverticulitis.  She started antibiotics and fluids with D5 due to persistent hypoglycemia of unclear etiology.  Care transferred to [facility] [place] who will follow up on CT head, CT abdomen with likely admission.  Patient seen eval by attending, Dr. Ralene Bathe who agrees above treatment, plan and disposition     MDM Rules/Calculators/A&P                           Final Clinical Impression(s) / ED Diagnoses Final diagnoses:  Altered mental  status, unspecified altered mental status type  Hypoglycemia  Hyponatremia  Sepsis without acute organ dysfunction, due to unspecified organism [facility])  Diverticulitis    Rx / DC Orders ED Discharge Orders    None       Lanyla Costello A, PA-C 09/04/20 1522    Quintella Reichert, MD 09/05/20 [phone]

## 2020-09-04 NOTE — H&P (Signed)
History and Physical    April [name] QZE:[id number] DOB: [birth year] DOA: 09/04/2020  PCP: [name], MD  Patient coming from: stable  I have personally briefly reviewed patient's old medical records in [place]  Chief Complaint: abdominal pain, confusion  HPI: April [name] is April [name] 64 y.o. [name] with medical history significant of PCKD s/p renal transplant, COPD on 2-3 L, HFpEF, hypertension, GERD and multiple other medical problems who presents with abdominal pain and confusion.  History is obtained from patient, chart, and daughter (somewhat limited from pt as mildly lethargic, though improved as we spoke).  Her symptoms started about 1 week ago.  The pain lasted through the weekend, but on Monday she developed nausea and vomiting in addition to labored breathing.  She was seen at [facility] who recommended she go to [facility].  They diagnosed her with diverticulitis and sent her home with abx and pain meds.  After being home, she was unable to keep things down and continued to have nausea and vomiting.  Home health RN recommended she come to hospital today.  EMS noted glucometer read low.  She had confusion and lethargy.  + fevers to 101.5 on 10/12.  She notes some SOB.  Denies CP.  Notes continued abdominal pain.  Nausea and dry heaving.  Decreased appetite.  Dark urine.  ED Course: Labs, imaging. IVF, abx, IV steroids.  Imaging suggestive of diverticulitis.  Dextrose containing fluids for hypoglycemia.  Admit to hospitalists for sepsis 2/2 diverticulosis with adrenal insufficiency.  Review of Systems: As per HPI otherwise all other systems reviewed and are negative.  Past Medical History:  Diagnosis Date  . Arthritis   . Asthma   . CHF (congestive heart failure) ([place])   . COPD (chronic obstructive pulmonary disease) ([place])   . Depression   . Essential hypertension 02/08/2020  . GERD (gastroesophageal reflux disease)   . Hyperlipidemia   . Hyperparathyroidism   . Polycystic  kidney   . PONV (postoperative nausea and vomiting)     Past Surgical History:  Procedure Laterality Date  . 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 BILATERAL OOPHORECTOMY    . BIOPSY  05/19/2020   Procedure: BIOPSY;  Surgeon: Carol Ada, MD;  Location: [facility] ENDOSCOPY;  Service: Endoscopy;;  . CARPAL TUNNEL RELEASE  2000  . CESAREAN SECTION    . COLONOSCOPY WITH PROPOFOL N/Cleon Signorelli 05/19/2020   Procedure: COLONOSCOPY WITH PROPOFOL;  Surgeon: Carol Ada, MD;  Location: [place];  Service: Endoscopy;  Laterality: N/Elisabetta Mishra;  . ENTEROSCOPY N/Reia Viernes 05/19/2020   Procedure: ENTEROSCOPY;  Surgeon: Carol Ada, MD;  Location: [place];  Service: Endoscopy;  Laterality: N/Georgie Haque;  . INSERTION OF DIALYSIS CATHETER  03/31/2012   Procedure: INSERTION OF DIALYSIS CATHETER;  Surgeon: Angelia Mould, MD;  Location: [place];  Service: Vascular;  Laterality: N/Keyry Iracheta;  insertion of dialysis catheter right internal jugular vein  . 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 ORIF TIBIA FRACTURE Left 12/23/2018   Procedure: OPEN REDUCTION INTERNAL FIXATION (ORIF) TIBIA FRACTURE;  Surgeon: Shona Needles, MD;  Location: [place];  Service: Orthopedics;  Laterality: Left;  . ORIF TIBIA PLATEAU Right 12/23/2018   Procedure: OPEN REDUCTION INTERNAL FIXATION (ORIF) TIBIAL PLATEAU;  Surgeon: Shona Needles, MD;  Location: [place];  Service: Orthopedics;  Laterality: Right;  . ORIF WRIST FRACTURE Left 12/08/2019   Procedure: OPEN REDUCTION INTERNAL FIXATION (ORIF) WRIST FRACTURE, REPAIR LACERATION LEFT FOREARM;  Surgeon: Dorna Leitz, MD;  Location: [facility];  Service: Orthopedics;  Laterality: Left;  . TONSILLECTOMY  1967  . TOTAL HIP ARTHROPLASTY Left 12/08/2019   Procedure: TOTAL HIP ARTHROPLASTY ANTERIOR APPROACH;  Surgeon: Dorna Leitz, MD;  Location: [facility];  Service: Orthopedics;  Laterality: Left;  . TUBAL LIGATION  2010    Social History  reports that she quit smoking about 22 years ago. Her smoking use  included cigarettes. She has Keren Alverio 3.75 pack-year smoking history. She has never used smokeless tobacco. She reports that she does not drink alcohol and does not use drugs.  Allergies  Allergen Reactions  . Infed [Iron Dextran] Other (See Comments)    Chest tightness  . Pentamidine Itching, Shortness Of Breath and Swelling  . Erythromycin [Erythromycin] Other (See Comments)    Mouth Ulcers  . Iohexol Other (See Comments)    Per patient "she has had Dorrie Cocuzza kidney transplant and should never have contrast"  . Oxycodone Nausea Only and Nausea And Vomiting  . Erythromycin Rash    Causes breakout in mouth  . Ultram [Tramadol Hcl] Anxiety    Family History  Problem Relation Age of Onset  . Hypertension Mother   . Heart disease Father        CABG history  . Polycystic kidney disease Father   . Polycystic kidney disease Brother    Prior to Admission medications   Medication Sig Start Date End Date Taking? Authorizing Provider  acetaminophen (TYLENOL) 325 MG tablet Take 2 tablets (650 mg total) by mouth every 6 (six) hours as needed for mild pain (or Fever >/= 101). 05/22/20  Yes Elgergawy, Silver Huguenin, MD  albuterol (VENTOLIN HFA) 108 (90 Base) MCG/ACT inhaler Inhale 2 puffs into the lungs every 4 (four) hours as needed for wheezing or shortness of breath. 01/04/20  Yes Angiulli, Lavon Paganini, PA-C  allopurinol (ZYLOPRIM) 300 MG tablet Take 1 tablet (300 mg total) by mouth daily. 01/04/20  Yes Angiulli, Lavon Paganini, PA-C  amoxicillin-clavulanate (AUGMENTIN) 875-125 MG tablet Take 1 tablet by mouth every 12 (twelve) hours. 09/02/20  Yes Curatolo, Adam, DO  ascorbic acid (VITAMIN C) 500 MG tablet Take 500 mg by mouth daily.   Yes [provider]  aspirin 81 MG chewable tablet Chew 81 mg by mouth daily.   Yes [provider]  brimonidine (ALPHAGAN P) 0.1 % SOLN Place 1 drop into both eyes 2 (two) times daily.    Yes [provider]  Budeson-Glycopyrrol-Formoterol (BREZTRI AEROSPHERE)  160-9-4.8 MCG/ACT AERO Inhale 2 puffs into the lungs 2 (two) times daily. 08/20/20  Yes Martyn Ehrich, NP  cholecalciferol (VITAMIN D3) 25 MCG (1000 UNIT) tablet Take 1 tablet (1,000 Units total) by mouth daily. 01/04/20  Yes Angiulli, Lavon Paganini, PA-C  cyanocobalamin 1000 MCG tablet Take 1,000 mcg by mouth daily.   Yes [provider]  cycloSPORINE (RESTASIS) 0.05 % ophthalmic emulsion Place 1 drop into both eyes 2 (two) times daily. 01/04/20  Yes Angiulli, Lavon Paganini, PA-C  dorzolamide (TRUSOPT) 2 % ophthalmic solution Place 1 drop into both eyes daily. 06/25/19  Yes [provider]  ferrous sulfate 325 (65 FE) MG tablet Take 325 mg by mouth daily with breakfast.   Yes [provider]  FLUoxetine (PROZAC) 40 MG capsule Take 1 capsule (40 mg total) by mouth daily. 01/04/20  Yes Angiulli, Lavon Paganini, PA-C  furosemide (LASIX) 40 MG tablet Take 1 tablet (40 mg total) by mouth daily. 02/07/20  Yes Sheikh, Omair Latif, DO  gabapentin (NEURONTIN) 100 MG  capsule Take 200 mg by mouth 3 (three) times daily.   Yes [provider]  ipratropium-albuterol (DUONEB) 0.5-2.5 (3) MG/3ML SOLN Take 3 mLs by nebulization every 6 (six) hours as needed (sob/wheezing).  08/24/20  Yes [provider]  isosorbide mononitrate (IMDUR) 60 MG 24 hr tablet Take 1 tablet (60 mg total) by mouth daily. 05/23/20  Yes Elgergawy, Silver Huguenin, MD  magnesium oxide (MAG-OX) 400 MG tablet Take 1 tablet (400 mg total) by mouth 2 (two) times daily. 01/04/20  Yes Angiulli, Lavon Paganini, PA-C  mycophenolate (MYFORTIC) 180 MG EC tablet Take 180 mg by mouth 2 (two) times daily.   Yes [provider]  oxyCODONE (ROXICODONE) 5 MG immediate release tablet Take 1 tablet (5 mg total) by mouth every 6 (six) hours as needed for up to 15 doses for breakthrough pain. 09/02/20  Yes Curatolo, Adam, DO  pantoprazole (PROTONIX) 40 MG tablet Take 1 tablet (40 mg total) by mouth 2 (two) times daily. 01/04/20  Yes Angiulli,  Lavon Paganini, PA-C  predniSONE (DELTASONE) 5 MG tablet Take 7.5 mg by mouth daily with breakfast.   Yes [provider]  PROGRAF 1 MG capsule Take 1-2 mg by mouth daily. 80m in the morning 177min the afternoon 03/22/19  Yes [provider]  promethazine (PHENERGAN) 25 MG tablet Take 1 tablet (25 mg total) by mouth every 8 (eight) hours as needed for up to 15 doses for nausea or vomiting. 09/02/20  Yes Curatolo, Adam, DO  TRAVATAN Z 0.004 % SOLN ophthalmic solution Place 1 drop into both eyes at bedtime. 07/03/16  Yes [provider]  valACYclovir (VALTREX) 500 MG tablet Take 500 mg by mouth 2 (two) times daily.   Yes [provider]  verapamil (CALAN-SR) 240 MG CR tablet Take 240 mg by mouth daily.   Yes [provider]  amoxicillin-clavulanate (AUGMENTIN) 875-125 MG tablet Take 1 tablet by mouth 2 (two) times daily for 10 days. 09/02/20 09/12/20  Curatolo, Adam, DO  Budeson-Glycopyrrol-Formoterol (BREZTRI AEROSPHERE) 160-9-4.8 MCG/ACT AERO Inhale 2 puffs into the lungs in the morning and at bedtime. 08/20/20   WaMartyn EhrichNP  levalbuterol (XPenne Lash0.63 MG/3ML nebulizer solution Take 0.63 mg by nebulization every 6 (six) hours as needed for wheezing or shortness of breath. Inhale 0.6357mnce daily.  May take 0.30m27mery six hours as needed COPD.    [provider]  Multiple Vitamin (MULTIVITAMIN WITH MINERALS) TABS tablet Take 1 tablet by mouth daily. Patient not taking: Reported on 09/04/2020 02/24/20   EzenAlma Friendly    Physical Exam: Vitals:   09/04/20 1630 09/04/20 1700 09/04/20 1730 09/04/20 1800  BP: 127/73 118/65 127/60 131/66  Pulse: 100 (!) 101 96 95  Resp: (!) 26 (!) 24 (!) 28 (!) 25  Temp:      TempSrc:      SpO2: 100% 100% 100% 100%  Weight:      Height:        Constitutional: NAD, calm, comfortable Vitals:   09/04/20 1630 09/04/20 1700 09/04/20 1730 09/04/20 1800  BP: 127/73 118/65 127/60 131/66  Pulse: 100  (!) 101 96 95  Resp: (!) 26 (!) 24 (!) 28 (!) 25  Temp:      TempSrc:      SpO2: 100% 100% 100% 100%  Weight:      Height:       Eyes: PERRL, lids and conjunctivae normal ENMT: Mucous membranes are moist. Posterior pharynx clear of any exudate  or lesions.Normal dentition.  Neck: normal, supple, no masses, no thyromegaly Respiratory: tachypneic, belly breathing, no clear adventitious breath sounds Cardiovascular: Regular rate and rhythm, no murmurs / rubs / gallops. No extremity edema.  Faint bilateral pulses. No carotid bruits.  Abdomen: no tenderness, no masses palpated. No hepatosplenomegaly. Bowel sounds positive.  Musculoskeletal: no clubbing / cyanosis. No joint deformity upper and lower extremities. Good ROM, no contractures. Normal muscle tone.  Skin: no rashes, lesions, ulcers. No induration Neurologic: Lethargic, arouses to voice and able to have conversation, Zacory Fiola&O.  CN 2-12 grossly intact.  Psychiatric: Normal judgment and insight. Alert and oriented x 3. Normal mood.   Labs on Admission: I have personally reviewed following labs and imaging studies  CBC: Recent Labs  Lab 09/02/20 2049 09/04/20 1417  WBC 8.0 18.3*  NEUTROABS  --  16.1*  HGB 10.6* 11.3*  HCT 34.7* 36.8  MCV 102.4* 101.4*  PLT 207 035    Basic Metabolic Panel: Recent Labs  Lab 09/02/20 2049 09/04/20 1417 09/04/20 1418  NA 134* 128*  --   K 4.1 3.7  --   CL 101 96*  --   CO2 22 21*  --   GLUCOSE 118* 112*  --   BUN 29* 30*  --   CREATININE 1.20* 1.31*  --   CALCIUM 9.8 9.6  --   MG  --   --  1.7    GFR: Estimated Creatinine Clearance: 40.2 mL/min (Arlynn Stare) (by C-G formula based on SCr of 1.31 mg/dL (H)).  Liver Function Tests: Recent Labs  Lab 09/02/20 2049 09/04/20 1417  AST 19 21  ALT 10 10  ALKPHOS 67 74  BILITOT 2.1* 2.6*  PROT 8.4* 8.9*  ALBUMIN 3.4* 3.4*    Urine analysis:    Component Value Date/Time   COLORURINE AMBER (Rani Sisney) 09/04/2020 1527   APPEARANCEUR CLOUDY (Mekisha Bittel)  09/04/2020 1527   LABSPEC 1.017 09/04/2020 1527   PHURINE 5.0 09/04/2020 1527   GLUCOSEU NEGATIVE 09/04/2020 1527   HGBUR SMALL (Chozen Latulippe) 09/04/2020 1527   BILIRUBINUR NEGATIVE 09/04/2020 1527   KETONESUR 5 (Antonia Jicha) 09/04/2020 1527   PROTEINUR >=300 (Brigido Mera) 09/04/2020 1527   UROBILINOGEN 0.2 05/15/2013 0733   NITRITE NEGATIVE 09/04/2020 1527   LEUKOCYTESUR NEGATIVE 09/04/2020 1527    Radiological Exams on Admission: CT ABDOMEN PELVIS WO CONTRAST  Result Date: 09/04/2020 CLINICAL DATA:  Left lower quadrant abdominal pain.  Dyspnea. EXAM: CT ABDOMEN AND PELVIS WITHOUT CONTRAST TECHNIQUE: Multidetector CT imaging of the abdomen and pelvis was performed following the standard protocol without IV contrast. COMPARISON:  09/02/2020 CT abdomen/pelvis. FINDINGS: Limited motion degraded scan. Lower chest: Trace dependent bilateral pleural effusions with mild-to-moderate bibasilar atelectasis, increased. Cardiomegaly. Trace pericardial effusion/thickening, unchanged. Hepatobiliary: Normal liver size. Numerous simple cysts scattered throughout the liver, largest 3.4 cm in the left liver lobe. Numerous subcentimeter hypodense lesions scattered throughout the liver are too small to characterize and are not appreciably changed. No appreciable new liver lesions. Normal gallbladder with no radiopaque cholelithiasis. No biliary ductal dilatation. Pancreas: Normal, with no mass or duct dilation. Spleen: Mild splenomegaly. Craniocaudal splenic length 13.6 cm, stable. No splenic mass. Adrenals/Urinary Tract: Normal adrenals. Prominently enlarged polycystic kidneys bilaterally. No hydronephrosis. No renal stones. Several of the renal cysts demonstrate mural calcifications in the kidneys bilaterally, unchanged. Dominant exophytic 7.1 cm lateral interpolar right renal cyst and dominant 7.6 cm lower left renal cyst. No overtly suspicious renal masses on this noncontrast scan. Right lower quadrant transplant kidney demonstrates no  hydronephrosis, no renal  stones, no contour deforming renal masses and no perinephric collections. Bladder is nondistended and is obscured by streak artifact from left hip hardware with no gross bladder abnormality. Stomach/Bowel: Normal non-distended stomach. Normal caliber small bowel with no small bowel wall thickening. Normal appendix. Moderate sigmoid diverticulosis. Mild wall thickening with associated mild pericolonic fat stranding in the mid sigmoid colon compatible with acute sigmoid diverticulitis, slightly improved compared to the CT study performed 2 days prior. No pericolonic free air or abscess. Vascular/Lymphatic: Atherosclerotic nonaneurysmal abdominal aorta. No pathologically enlarged lymph nodes in the abdomen or pelvis. Reproductive: Grossly normal uterus.  No adnexal mass. Other: No pneumoperitoneum. Small simple 2.5 x 1.8 cm cystic focus associated with an apparent small left inguinal hernia, unchanged (series 3/image 87). Small to moderate fat containing right periumbilical hernia is unchanged. Musculoskeletal: No aggressive appearing focal osseous lesions. Left total hip arthroplasty. Healed deformities in the left pubic rami. Mild thoracolumbar spondylosis. IMPRESSION: 1. Acute sigmoid diverticulitis, slightly improved compared to the CT study performed 2 days prior. No free air or abscess. 2. Trace dependent bilateral pleural effusions with mild-to-moderate bibasilar atelectasis, increased. 3. Cardiomegaly. Trace pericardial effusion/thickening, unchanged. 4. Prominently enlarged polycystic kidneys bilaterally, unchanged. No hydronephrosis. Right lower quadrant transplant kidney is unremarkable. 5. Stable mild splenomegaly. 6. Stable small simple 2.5 cm cystic focus associated with an apparent small left inguinal hernia. 7. Stable small to moderate fat containing right periumbilical hernia. 8. Aortic Atherosclerosis (ICD10-I70.0). Electronically Signed   By: Ilona Sorrel M.D.   On:  09/04/2020 16:27   CT ABDOMEN PELVIS WO CONTRAST  Result Date: 09/02/2020 CLINICAL DATA:  65 year old [name] with concern for bowel obstruction secondary to umbilical hernia. EXAM: CT ABDOMEN AND PELVIS WITHOUT CONTRAST TECHNIQUE: Multidetector CT imaging of the abdomen and pelvis was performed following the standard protocol without IV contrast. COMPARISON:  CT abdomen pelvis dated 05/14/2020. FINDINGS: Evaluation of this exam is limited in the absence of intravenous contrast. Lower chest: There are bibasilar linear atelectasis/scarring. There is trace right pleural effusion. Coronary vascular calcifications noted. No intra-abdominal free air or free fluid. Hepatobiliary: Multiple liver cysts as seen on the prior CT. No intrahepatic biliary ductal dilatation. There is sludge in the gallbladder. No pericholecystic fluid. Pancreas: Unremarkable. No pancreatic ductal dilatation or surrounding inflammatory changes. Spleen: Normal in size without focal abnormality. Adrenals/Urinary Tract: The adrenal glands are unremarkable. Polycystic kidneys with replaced renal parenchyma by innumerable cysts. Scattered parenchymal calcifications noted. There is no hydronephrosis or obstructing stone. There is Mindie Rawdon right lower quadrant renal transplant. There is no hydronephrosis or nephrolithiasis of the transplant kidney. No peritransplant fluid collection. The urinary bladder is unremarkable. Stomach/Bowel: There is sigmoid diverticulosis. There is inflammatory changes centered at Eilleen Davoli sigmoid diverticula consistent with acute diverticulitis. There is no bowel obstruction. Normal appendix. Vascular/Lymphatic: Moderate aortoiliac atherosclerotic disease. The IVC is unremarkable. No portal venous gas. There is no adenopathy. Reproductive: The uterus is retroflexed. No adnexal masses. Other: There is Codee Tutson small fat containing umbilical hernia. There is mild stranding of the herniated fat, likely chronic. Correlation with clinical exam and  point tenderness recommended to exclude strangulation/incarceration. No fluid collection. Musculoskeletal: Degenerative changes of the spine. Old left pubic bone fracture. Left hip arthroplasty. No acute osseous pathology. Bilateral sacral al Derrick Tiegs insufficiency fractures similar to prior CT. IMPRESSION: 1. Sigmoid diverticulitis.  No diverticular abscess or perforation. 2. No bowel obstruction. Normal appendix. 3. Small fat containing umbilical hernia with mild stranding of the herniated fat, likely chronic. Correlation with clinical exam and  point tenderness recommended to exclude strangulation/incarceration. 4. Polycystic kidneys and liver. 5. Aortic Atherosclerosis (ICD10-I70.0). Electronically Signed   By: Anner Crete M.D.   On: 09/02/2020 22:27   CT Head Wo Contrast  Result Date: 09/04/2020 CLINICAL DATA:  Delirium. EXAM: CT HEAD WITHOUT CONTRAST TECHNIQUE: Contiguous axial images were obtained from the base of the skull through the vertex without intravenous contrast. COMPARISON:  Head CT 04/15/2020. FINDINGS: Brain: There is no acute intracranial hemorrhage. No demarcated cortical infarct. No extra-axial fluid collection. No evidence of intracranial mass. No midline shift. Vascular: No hyperdense vessel. Skull: Normal. Negative for fracture or focal lesion. Sinuses/Orbits: Visualized orbits show no acute finding. No significant paranasal sinus disease or mastoid effusion at the imaged levels. IMPRESSION: No evidence of acute intracranial abnormality. Electronically Signed   By: Kellie Simmering DO   On: 09/04/2020 16:22   DG Chest Port 1 View  Result Date: 09/04/2020 CLINICAL DATA:  Questionable sepsis.  Evaluate for abnormality. EXAM: PORTABLE CHEST 1 VIEW COMPARISON:  Multiple priors, most recent 07/18/2020 FINDINGS: Cardiac silhouette is similar to priors and accentuated by low lung volumes and portable technique. Low lung volumes with streaky bibasilar opacities. No visible pleural effusions or  pneumothorax. No acute osseous abnormality. IMPRESSION: Low lung volumes with streaky bibasilar opacities, favor atelectasis. Electronically Signed   By: Margaretha Sheffield MD   On: 09/04/2020 14:57    EKG: Independently reviewed. ? Limb lead reversal -> repeat EKG pending  Assessment/Plan Active Problems:   Diverticulitis  Sepsis 2/2 Diverticulitis: tachypnea, intermittent tachycardia, leukocytosis with diverticulitis on imaging.  Also with acute metabolic encephalopathy.  BP's have been within appropriate range.  She's immunocompromised with hx kidney transplant and on chronic steroids and antirejection meds.  CT abdomen/pelvis with acute sigmoid diverticulitis Lactic negative x2 Follow blood (only 1 blood cx obtained) and urine cx S/p ceftriaxone/flagyl in ED.  Start zosyn. S/p 500 cc bolus, will hold off on 30 cc/kg given hx HF, normal lactic acid and BP, and tachypnea on exam.  Gentle IVF for now.   Hypoglycemia  Adrenal Insufficiency: 2/2 acute illness on chronic steroids with prednisone 7.5 mg daily for hx renal transplant.  EMS noted CBG reading low.   Start stress dose steroids Continue D10w at 40 cc/hr Q4H blood sugar check  Tachypnea  Shortness of Breath  Chronic Hypoxic Respiratory Failure: she's currently on her home oxygen (per previous notes, typically on ~3 L --- daughter notes she sometimes doesn't need her o2--- she's currently satting well on 2 L).   Currently tachypneic with some belly breathing -> VBG with normal pH/pCO2 Tachypnea possibly 2/2 sepsis above.  Will continue to monitor, caution with IVF given hx of HF.  Acute Metabolic Encephalopathy: likely 2/2 sepsis and hypoglycemia.  She's improving, but still lethargic.   Normal ammonia, pCO2 Follow TSH, B12, folate Delirium precautuions  Hx Polycystic Kidney Disease s/p renal transplant  CKD IIIb:   Baseline creatinine appars to be 1-1.2 Currently close to baseline Follow with gentle IVF Continue  tacrolimus, mycophenolate.  On stress dose steroids.  Valtrex.  Abnormal UA: hematuria - follow urine cx  HFpEF: echo 03/2020 with EF 55-60%, grade II diastolic dysfunction, mildly elevated PASP.   Hold lasix for now with sepsis.  Follow BNP. Follow with gentle IVF  Wt Readings from Last 3 Encounters:  09/04/20 68.3 kg  08/20/20 73.6 kg  08/05/20 74.4 kg   COPD  Chronic Hypoxic Resp Failure: Continue home meds  Smoldering multiple myeloma Follows with  Dr. Lorenso Courier outpatient Following q3 months outpatient   Gout: continue allopurinol  Hypertension: hold imdur, lasix in setting of sepsis.  Continue verapamil for now.  GERD: PPI  Umbilical Hernia  Left Inguinal Hernia: fat containing umbilical hernia as well as L inguinal hernia with 2.5 cm cystic focus  DVT prophylaxis: lovenox Code Status:   Full  Family Communication:  Daughter, [name] Disposition Plan:   Patient is from:  home  Anticipated DC to:  home  Anticipated DC date:  >3 days  Anticipated DC barriers: Improvement in sepsis, infection  Consults called:  none  Admission status:  inpatient   Severity of Illness: The appropriate patient status for this patient is INPATIENT. Inpatient status is judged to be reasonable and necessary in order to provide the required intensity of service to ensure the patient's safety. The patient's presenting symptoms, physical exam findings, and initial radiographic and laboratory data in the context of their chronic comorbidities is felt to place them at high risk for further clinical deterioration. Furthermore, it is not anticipated that the patient will be medically stable for discharge from the hospital within 2 midnights of admission. The following factors support the patient status of inpatient.   " The patient's presenting symptoms include abdominal pain. " The worrisome physical exam findings include tachypnea. " The initial radiographic and laboratory data are worrisome because  of diverticulitis. " The chronic co-morbidities include renal transplant, COPD.   * I certify that at the point of admission it is my clinical judgment that the patient will require inpatient hospital care spanning beyond 2 midnights from the point of admission due to high intensity of service, high risk for further deterioration and high frequency of surveillance required.Fayrene Helper MD [facility]  How to contact the [facility] Attending or Consulting provider [place] or covering provider during after hours [place], for this patient?   1. Check the care team in [facility] and look for Seth Friedlander) attending/consulting [facility] provider listed and b) the [facility] team listed 2. Log into [email or web address] and use [employer] universal password to access. If you do not have the password, please contact the hospital operator. 3. Locate the [facility] provider you are looking for under [facility] and page to Quanisha Drewry number that you can be directly reached. 4. If you still have difficulty reaching the provider, please page the [facility] (Director on Call) for the Hospitalists listed on amion for assistance.  09/04/2020, 6:31 PM

## 2020-09-04 NOTE — Progress Notes (Signed)
Pharmacy Antibiotic Note  April [name] is a 64 y.o. female admitted on 09/04/2020 with diverticulitis.  Pharmacy has been consulted for Zosyn dosing.  CrCl >62ml/min  Plan: Zosyn 3.375g IV q8h (4 hour infusion).  No dose adjustments anticipated.  Pharmacy will sign off and monitor peripherally via electronic surveillance software.  Please re-consult if needed.   Height: 5\' 1"  (154.9 cm) Weight: 73 kg (160 lb 15 oz) IBW/kg (Calculated) : 47.8  Temp (24hrs), Avg:98.4 F (36.9 C), Min:98.4 F (36.9 C), Max:98.4 F (36.9 C)  Recent Labs  Lab 09/02/20 2049 09/04/20 1417 09/04/20 1620  WBC 8.0 18.3*  --   CREATININE 1.20* 1.31*  --   LATICACIDVEN  --  1.0 0.9    Estimated Creatinine Clearance: 40.2 mL/min (A) (by C-G formula based on SCr of 1.31 mg/dL (H)).    Allergies  Allergen Reactions  . Infed [Iron Dextran] Other (See Comments)    Chest tightness  . Pentamidine Itching, Shortness Of Breath and Swelling  . Erythromycin [Erythromycin] Other (See Comments)    Mouth Ulcers  . Iohexol Other (See Comments)    Per patient "she has had a kidney transplant and should never have contrast"  . Oxycodone Nausea Only and Nausea And Vomiting  . Erythromycin Rash    Causes breakout in mouth  . Ultram [Tramadol Hcl] Anxiety    Thank you for allowing pharmacy to be a part of this patient's care.  Netta Cedars PharmD 09/04/2020 6:37 PM

## 2020-09-04 NOTE — ED Provider Notes (Addendum)
Accepted handoff at shift change from [facility]. Please see prior provider note for more detail.   Briefly: Patient is 64 y.o. chronically ill 64 year old female with medical history of CHF, COPD on chronic 2 L, hypertension, reflux, polycystic disease s/p transplant.  Came in by EMS found to have low CBG given D10 with improvement in mentation.  Was seen 2 days ago and diagnosed with diverticulitis states that she has been vomiting and unable to keep her medications down.  Denies any diarrhea melena or hematochezia.  States she does not feel short of breath denies cough states that she may have had fevers.  DDX: concern for sepsis likely secondary to diverticulitis.  Plan: Follow-up on CT head and CT abdomen pelvis.   Physical Exam  BP 118/65   Pulse (!) 101   Temp 98.4 F (36.9 C) (Oral)   Resp (!) 24   Ht 5' 1"  (1.549 m)   Wt 73 kg   SpO2 100%   BMI 30.41 kg/m   CONSTITUTIONAL:   Mildly tachypneic ill-appearing 64 year old female. NEURO:  Alert and oriented x 3, no focal deficits, very somnolent arouses to painful stimuli. EYES:  pupils equal and reactive ENT/NECK:  trachea midline, no JVD CARDIO:  reg rate, reg rhythm, well-perfused PULM:  None labored breathing GI/GU:  Abdomen non-distended, ventral hernia present umbilical.  Significant left lower quadrant on palpation.  No guarding or rebound. MSK/SPINE:  No gross deformities, no edema SKIN:  no rash obvious, atraumatic, no ecchymosis  PSYCH:  Appropriate speech and behavior   ED Course/Procedures     Procedures  Results for orders placed or performed during the hospital encounter of 09/04/20  Respiratory Panel by RT PCR (Flu A&B, Covid) - Nasopharyngeal Swab   Specimen: Nasopharyngeal Swab  Result Value Ref Range   SARS Coronavirus 2 by RT PCR NEGATIVE NEGATIVE   Influenza A by PCR NEGATIVE NEGATIVE   Influenza B by PCR NEGATIVE NEGATIVE  Lactic acid, plasma  Result Value Ref Range   Lactic Acid, Venous 1.0  0.5 - 1.9 mmol/L  Comprehensive metabolic panel  Result Value Ref Range   Sodium 128 (L) 135 - 145 mmol/L   Potassium 3.7 3.5 - 5.1 mmol/L   Chloride 96 (L) 98 - 111 mmol/L   CO2 21 (L) 22 - 32 mmol/L   Glucose, Bld 112 (H) 70 - 99 mg/dL   BUN 30 (H) 8 - 23 mg/dL   Creatinine, Ser 1.31 (H) 0.44 - 1.00 mg/dL   Calcium 9.6 8.9 - 10.3 mg/dL   Total Protein 8.9 (H) 6.5 - 8.1 g/dL   Albumin 3.4 (L) 3.5 - 5.0 g/dL   AST 21 15 - 41 U/L   ALT 10 0 - 44 U/L   Alkaline Phosphatase 74 38 - 126 U/L   Total Bilirubin 2.6 (H) 0.3 - 1.2 mg/dL   GFR, Estimated 43 (L) >60 mL/min   Anion gap 11 5 - 15  CBC WITH DIFFERENTIAL  Result Value Ref Range   WBC 18.3 (H) 4.0 - 10.5 K/uL   RBC 3.63 (L) 3.87 - 5.11 MIL/uL   Hemoglobin 11.3 (L) 12.0 - 15.0 g/dL   HCT 36.8 36 - 46 %   MCV 101.4 (H) 80.0 - 100.0 fL   MCH 31.1 26.0 - 34.0 pg   MCHC 30.7 30.0 - 36.0 g/dL   RDW 19.9 (H) 11.5 - 15.5 %   Platelets 190 150 - 400 K/uL   nRBC 0.0 0.0 - 0.2 %  Neutrophils Relative % 88 %   Neutro Abs 16.1 (H) 1.7 - 7.7 K/uL   Lymphocytes Relative 7 %   Lymphs Abs 1.2 0.7 - 4.0 K/uL   Monocytes Relative 3 %   Monocytes Absolute 0.5 0.1 - 1.0 K/uL   Eosinophils Relative 0 %   Eosinophils Absolute 0.1 0.0 - 0.5 K/uL   Basophils Relative 0 %   Basophils Absolute 0.0 0.0 - 0.1 K/uL   Immature Granulocytes 2 %   Abs Immature Granulocytes 0.28 (H) 0.00 - 0.07 K/uL  Protime-INR  Result Value Ref Range   Prothrombin Time 17.1 (H) 11.4 - 15.2 seconds   INR 1.5 (H) 0.8 - 1.2  APTT  Result Value Ref Range   aPTT 37 (H) 24 - 36 seconds  Urinalysis, Routine w reflex microscopic  Result Value Ref Range   Color, Urine AMBER (A) YELLOW   APPearance CLOUDY (A) CLEAR   Specific Gravity, Urine 1.017 1.005 - 1.030   pH 5.0 5.0 - 8.0   Glucose, UA NEGATIVE NEGATIVE mg/dL   Hgb urine dipstick SMALL (A) NEGATIVE   Bilirubin Urine NEGATIVE NEGATIVE   Ketones, ur 5 (A) NEGATIVE mg/dL   Protein, ur >=300 (A) NEGATIVE mg/dL    Nitrite NEGATIVE NEGATIVE   Leukocytes,Ua NEGATIVE NEGATIVE   RBC / HPF 11-20 0 - 5 RBC/hpf   WBC, UA 0-5 0 - 5 WBC/hpf   Bacteria, UA RARE (A) NONE SEEN   Squamous Epithelial / LPF 0-5 0 - 5   Mucus PRESENT    Amorphous Crystal PRESENT   Ammonia  Result Value Ref Range   Ammonia 18 9 - 35 umol/L  Magnesium  Result Value Ref Range   Magnesium 1.7 1.7 - 2.4 mg/dL  Blood gas, venous (at [facility] and [facility], not at [facility])  Result Value Ref Range   pH, Ven 7.301 7.25 - 7.43   pCO2, Ven 47.6 44 - 60 mmHg   pO2, Ven 35.7 32 - 45 mmHg   Bicarbonate 22.8 20.0 - 28.0 mmol/L   Acid-base deficit 3.2 (H) 0.0 - 2.0 mmol/L   O2 Saturation 61.0 %   Patient temperature 98.6   Rapid urine drug screen (hospital performed)  Result Value Ref Range   Opiates NONE DETECTED NONE DETECTED   Cocaine NONE DETECTED NONE DETECTED   Benzodiazepines NONE DETECTED NONE DETECTED   Amphetamines NONE DETECTED NONE DETECTED   Tetrahydrocannabinol NONE DETECTED NONE DETECTED   Barbiturates NONE DETECTED NONE DETECTED  CBG monitoring, ED  Result Value Ref Range   Glucose-Capillary 172 (H) 70 - 99 mg/dL  POC SARS Coronavirus 2 Ag-ED - Nasal Swab (BD Veritor Kit)  Result Value Ref Range   SARS Coronavirus 2 Ag NEGATIVE NEGATIVE  POC CBG, ED  Result Value Ref Range   Glucose-Capillary 94 70 - 99 mg/dL   Comment 1 Notify RN   CBG monitoring, ED  Result Value Ref Range   Glucose-Capillary 79 70 - 99 mg/dL   CT ABDOMEN PELVIS WO CONTRAST  Result Date: 09/04/2020 CLINICAL DATA:  Left lower quadrant abdominal pain.  Dyspnea. EXAM: CT ABDOMEN AND PELVIS WITHOUT CONTRAST TECHNIQUE: Multidetector CT imaging of the abdomen and pelvis was performed following the standard protocol without IV contrast. COMPARISON:  09/02/2020 CT abdomen/pelvis. FINDINGS: Limited motion degraded scan. Lower chest: Trace dependent bilateral pleural effusions with mild-to-moderate bibasilar atelectasis, increased. Cardiomegaly. Trace pericardial  effusion/thickening, unchanged. Hepatobiliary: Normal liver size. Numerous simple cysts scattered throughout the liver, largest 3.4 cm in the left  liver lobe. Numerous subcentimeter hypodense lesions scattered throughout the liver are too small to characterize and are not appreciably changed. No appreciable new liver lesions. Normal gallbladder with no radiopaque cholelithiasis. No biliary ductal dilatation. Pancreas: Normal, with no mass or duct dilation. Spleen: Mild splenomegaly. Craniocaudal splenic length 13.6 cm, stable. No splenic mass. Adrenals/Urinary Tract: Normal adrenals. Prominently enlarged polycystic kidneys bilaterally. No hydronephrosis. No renal stones. Several of the renal cysts demonstrate mural calcifications in the kidneys bilaterally, unchanged. Dominant exophytic 7.1 cm lateral interpolar right renal cyst and dominant 7.6 cm lower left renal cyst. No overtly suspicious renal masses on this noncontrast scan. Right lower quadrant transplant kidney demonstrates no hydronephrosis, no renal stones, no contour deforming renal masses and no perinephric collections. Bladder is nondistended and is obscured by streak artifact from left hip hardware with no gross bladder abnormality. Stomach/Bowel: Normal non-distended stomach. Normal caliber small bowel with no small bowel wall thickening. Normal appendix. Moderate sigmoid diverticulosis. Mild wall thickening with associated mild pericolonic fat stranding in the mid sigmoid colon compatible with acute sigmoid diverticulitis, slightly improved compared to the CT study performed 2 days prior. No pericolonic free air or abscess. Vascular/Lymphatic: Atherosclerotic nonaneurysmal abdominal aorta. No pathologically enlarged lymph nodes in the abdomen or pelvis. Reproductive: Grossly normal uterus.  No adnexal mass. Other: No pneumoperitoneum. Small simple 2.5 x 1.8 cm cystic focus associated with an apparent small left inguinal hernia, unchanged (series  3/image 87). Small to moderate fat containing right periumbilical hernia is unchanged. Musculoskeletal: No aggressive appearing focal osseous lesions. Left total hip arthroplasty. Healed deformities in the left pubic rami. Mild thoracolumbar spondylosis. IMPRESSION: 1. Acute sigmoid diverticulitis, slightly improved compared to the CT study performed 2 days prior. No free air or abscess. 2. Trace dependent bilateral pleural effusions with mild-to-moderate bibasilar atelectasis, increased. 3. Cardiomegaly. Trace pericardial effusion/thickening, unchanged. 4. Prominently enlarged polycystic kidneys bilaterally, unchanged. No hydronephrosis. Right lower quadrant transplant kidney is unremarkable. 5. Stable mild splenomegaly. 6. Stable small simple 2.5 cm cystic focus associated with an apparent small left inguinal hernia. 7. Stable small to moderate fat containing right periumbilical hernia. 8. Aortic Atherosclerosis (ICD10-I70.0). Electronically Signed   By: Ilona Sorrel M.D.   On: 09/04/2020 16:27   CT ABDOMEN PELVIS WO CONTRAST  Result Date: 09/02/2020 CLINICAL DATA:  64 year old female with concern for bowel obstruction secondary to umbilical hernia. EXAM: CT ABDOMEN AND PELVIS WITHOUT CONTRAST TECHNIQUE: Multidetector CT imaging of the abdomen and pelvis was performed following the standard protocol without IV contrast. COMPARISON:  CT abdomen pelvis dated 05/14/2020. FINDINGS: Evaluation of this exam is limited in the absence of intravenous contrast. Lower chest: There are bibasilar linear atelectasis/scarring. There is trace right pleural effusion. Coronary vascular calcifications noted. No intra-abdominal free air or free fluid. Hepatobiliary: Multiple liver cysts as seen on the prior CT. No intrahepatic biliary ductal dilatation. There is sludge in the gallbladder. No pericholecystic fluid. Pancreas: Unremarkable. No pancreatic ductal dilatation or surrounding inflammatory changes. Spleen: Normal in size  without focal abnormality. Adrenals/Urinary Tract: The adrenal glands are unremarkable. Polycystic kidneys with replaced renal parenchyma by innumerable cysts. Scattered parenchymal calcifications noted. There is no hydronephrosis or obstructing stone. There is a right lower quadrant renal transplant. There is no hydronephrosis or nephrolithiasis of the transplant kidney. No peritransplant fluid collection. The urinary bladder is unremarkable. Stomach/Bowel: There is sigmoid diverticulosis. There is inflammatory changes centered at a sigmoid diverticula consistent with acute diverticulitis. There is no bowel obstruction. Normal appendix. Vascular/Lymphatic: Moderate aortoiliac atherosclerotic  disease. The IVC is unremarkable. No portal venous gas. There is no adenopathy. Reproductive: The uterus is retroflexed. No adnexal masses. Other: There is a small fat containing umbilical hernia. There is mild stranding of the herniated fat, likely chronic. Correlation with clinical exam and point tenderness recommended to exclude strangulation/incarceration. No fluid collection. Musculoskeletal: Degenerative changes of the spine. Old left pubic bone fracture. Left hip arthroplasty. No acute osseous pathology. Bilateral sacral al a insufficiency fractures similar to prior CT. IMPRESSION: 1. Sigmoid diverticulitis.  No diverticular abscess or perforation. 2. No bowel obstruction. Normal appendix. 3. Small fat containing umbilical hernia with mild stranding of the herniated fat, likely chronic. Correlation with clinical exam and point tenderness recommended to exclude strangulation/incarceration. 4. Polycystic kidneys and liver. 5. Aortic Atherosclerosis (ICD10-I70.0). Electronically Signed   By: Anner Crete M.D.   On: 09/02/2020 22:27   CT Head Wo Contrast  Result Date: 09/04/2020 CLINICAL DATA:  Delirium. EXAM: CT HEAD WITHOUT CONTRAST TECHNIQUE: Contiguous axial images were obtained from the base of the skull  through the vertex without intravenous contrast. COMPARISON:  Head CT 04/15/2020. FINDINGS: Brain: There is no acute intracranial hemorrhage. No demarcated cortical infarct. No extra-axial fluid collection. No evidence of intracranial mass. No midline shift. Vascular: No hyperdense vessel. Skull: Normal. Negative for fracture or focal lesion. Sinuses/Orbits: Visualized orbits show no acute finding. No significant paranasal sinus disease or mastoid effusion at the imaged levels. IMPRESSION: No evidence of acute intracranial abnormality. Electronically Signed   By: Kellie Simmering DO   On: 09/04/2020 16:22   DG Chest Port 1 View  Result Date: 09/04/2020 CLINICAL DATA:  Questionable sepsis.  Evaluate for abnormality. EXAM: PORTABLE CHEST 1 VIEW COMPARISON:  Multiple priors, most recent 07/18/2020 FINDINGS: Cardiac silhouette is similar to priors and accentuated by low lung volumes and portable technique. Low lung volumes with streaky bibasilar opacities. No visible pleural effusions or pneumothorax. No acute osseous abnormality. IMPRESSION: Low lung volumes with streaky bibasilar opacities, favor atelectasis. Electronically Signed   By: Margaretha Sheffield MD   On: 09/04/2020 14:57     MDM   The patient appears reasonably stabilized for admission considering the current resources, flow, and capabilities available in the ED at this time, and I doubt any other [facility] requiring further screening and/or treatment in the ED prior to admission.  Discussed with Dr. Florene Glen MD who will admit for sepsis likely 2/2 diverticulitis.   CT scan with mild improvement of diverticulitis.  Patient has been unable to tolerate p.o. at home and is unable to take antibiotics consistently or keep them down.  CT head is without any acute abnormality.  She is significantly tender in the left lower quadrant.  Blood sugars have remained relatively low most recently 79 currently on D5 infusion.  We will continue to monitor CBG mental  status is stable she is answering questions.  Seems to be significantly improved from prior evaluation when first presented.  She is already received antibiotics.  Cultures pending.  Leukocytosis present.  See prior note for full details.  Dr. Ayesha Rumpf given Solu-Cortef for possible adrenal insufficiency.   Tedd Sias, [place] 09/04/20 1707    Tedd Sias, [place] 09/04/20 1710    Milton Ferguson, MD 09/06/20 1108

## 2020-09-04 NOTE — ED Triage Notes (Signed)
Per ems patient having altered mental status. Patient is tachypnea and tachycardic on arrival. EMS reports [id number]. Ems administered Dextrose10 240ml bag.

## 2020-09-04 NOTE — ED Notes (Signed)
Unable to obtain 2nd set of blood cultures. MD aware 

## 2020-09-04 NOTE — ED Notes (Signed)
ED TO INPATIENT HANDOFF REPORT  Name/Age/Gender April [name] 64 y.o. female  Code Status Code Status History    Date Active Date Inactive Code Status Order ID Comments User Context   07/02/2020 0544 07/03/2020 2259 Full Code [id number]  Vianne Bulls, MD ED   05/27/2020 1528 06/06/2020 2348 Full Code [id number]  Norval Morton, MD ED   05/13/2020 1420 05/22/2020 1900 Full Code [id number]  Lequita Halt, MD ED   04/29/2020 1516 05/04/2020 0238 Full Code [id number]  Lequita Halt, MD ED   04/16/2020 0206 04/20/2020 0305 Full Code [id number]  Shalhoub, Sherryll Burger, MD ED   04/07/2020 1011 04/12/2020 0216 Full Code [id number]  Norval Morton, MD ED   03/13/2020 1449 03/19/2020 0247 Full Code [id number]  Lequita Halt, MD ED   02/08/2020 0914 02/23/2020 2209 Full Code [id number]  Karmen Bongo, MD ED   02/01/2020 0240 02/07/2020 0304 Full Code [id number]  Sid Falcon, MD ED   12/15/2019 1747 01/05/2020 1743 Full Code [id number]  Cathlyn Parsons, PA-C Inpatient   12/15/2019 1747 12/15/2019 1747 Full Code [id number]  Cathlyn Parsons, PA-C Inpatient   12/07/2019 1643 12/15/2019 1648 Full Code [id number]  Mendel Corning, MD Inpatient   06/29/2019 2043 07/02/2019 2032 Full Code [id number]  Jani Gravel, MD ED   04/19/2019 1739 04/23/2019 1713 Full Code [id number]  Mariel Aloe, MD Inpatient   12/22/2018 0125 12/29/2018 2007 Full Code [id number]  Toy Baker, MD Inpatient   10/03/2018 0047 10/05/2018 1907 Full Code [id number]  Rise Patience, MD Inpatient   10/01/2017 2110 10/02/2017 1942 Full Code [id number]  Vianne Bulls, MD ED   07/15/2016 2352 07/16/2016 2035 Full Code [id number]  Reubin Milan, MD ED   Advance Care Planning Activity    Questions for Most Recent Historical Code Status (Order [id number])       Home/SNF/Other Home  Chief Complaint Diverticulitis [K57.92]  Level of Care/Admitting Diagnosis ED Disposition    ED Disposition Condition [name] [100102]  Level of Care: Stepdown [14]  Admit to SDU based on following criteria: Severe physiological/psychological symptoms:  Any diagnosis requiring assessment & intervention at least every 4 hours on an ongoing basis to obtain desired patient outcomes including stability and rehabilitation  Admit to SDU based on following criteria: Respiratory Distress:  Frequent assessment and/or intervention to maintain adequate ventilation/respiration, pulmonary toilet, and respiratory treatment.  May admit patient to [name] or [name] if equivalent level of care is available:: No  Covid Evaluation: Asymptomatic Screening Protocol (No Symptoms)  Diagnosis: Diverticulitis [287867]  Admitting Physician: Elodia Florence [id number]  Attending Physician: Cephus Slater, A CALDWELL [phone]  Estimated length of stay: past midnight tomorrow  Certification:: [name] will need inpatient services for at least 2 midnights       Medical History Past Medical History:  Diagnosis Date  . Arthritis   . Asthma   . CHF (congestive heart failure) ([name])   . COPD (chronic obstructive pulmonary disease) ([name])   . Depression   . Essential hypertension 02/08/2020  . GERD (gastroesophageal reflux disease)   . Hyperlipidemia   . Hyperparathyroidism   . Polycystic kidney   . PONV (postoperative nausea and vomiting)     Allergies Allergies  Allergen Reactions  . Infed [Iron Dextran] Other (See Comments)    Chest tightness  . Pentamidine Itching, Shortness Of Breath and Swelling  .  Erythromycin [Erythromycin] Other (See Comments)    Mouth Ulcers  . Iohexol Other (See Comments)    Per patient "she has had a kidney transplant and should never have contrast"  . Oxycodone Nausea Only and Nausea And Vomiting  . Erythromycin Rash    Causes breakout in mouth  . Ultram [Tramadol Hcl] Anxiety    IV Location/Drains/Wounds Patient Lines/Drains/Airways Status    Active  Line/Drains/Airways    Name Placement date Placement time Site Days   Peripheral IV 09/04/20 Right Antecubital 09/04/20  1313  Antecubital  less than 1   Vascular Access Left Upper arm Arteriovenous fistula --  --  Upper arm     Fistula / Graft Left Upper arm --  --  Upper arm     Incision (Closed) 05/31/20 Back Right 05/31/20  0918   96   Pressure Injury 05/27/20 Coccyx Mid Stage 3 -  Full thickness tissue loss. Subcutaneous fat may be visible but bone, tendon or muscle are NOT exposed. 05/27/20  1918   100   Pressure Injury 05/27/20 Heel Right;Left Stage 1 -  Intact skin with non-blanchable redness of a localized area usually over a bony prominence. 05/27/20  1918   100          Labs/Imaging Results for orders placed or performed during the hospital encounter of 09/04/20 (from the past 48 hour(s))  CBG monitoring, ED     Status: Abnormal   Collection Time: 09/04/20  1:24 PM  Result Value Ref Range   Glucose-Capillary 172 (H) 70 - 99 mg/dL    Comment: Glucose reference range applies only to samples taken after fasting for at least 8 hours.  Respiratory Panel by RT PCR (Flu A&B, Covid) - Nasopharyngeal Swab     Status: None   Collection Time: 09/04/20  2:14 PM   Specimen: Nasopharyngeal Swab  Result Value Ref Range   SARS Coronavirus 2 by RT PCR NEGATIVE NEGATIVE    Comment: (NOTE) SARS-CoV-2 target nucleic acids are NOT DETECTED.  The SARS-CoV-2 RNA is generally detectable in upper respiratoy specimens during the acute phase of infection. The lowest concentration of SARS-CoV-2 viral copies this assay can detect is 131 copies/mL. A negative result does not preclude SARS-Cov-2 infection and should not be used as the sole basis for treatment or other patient management decisions. A negative result may occur with  improper specimen collection/handling, submission of specimen other than nasopharyngeal swab, presence of viral mutation(s) within the areas targeted by this assay, and  inadequate number of viral copies (<131 copies/mL). A negative result must be combined with clinical observations, patient history, and epidemiological information. The expected result is Negative.  Fact Sheet for Patients:  PinkCheek.be  Fact Sheet for Healthcare Providers:  GravelBags.it  This test is no t yet approved or cleared by the [place] FDA and  has been authorized for detection and/or diagnosis of SARS-CoV-2 by FDA under an Emergency Use Authorization (EUA). This EUA will remain  in effect (meaning this test can be used) for the duration of the COVID-19 declaration under Section 564(b)(1) of the Act, 21 U.S.C. section 360bbb-3(b)(1), unless the authorization is terminated or revoked sooner.     Influenza A by PCR NEGATIVE NEGATIVE   Influenza B by PCR NEGATIVE NEGATIVE    Comment: (NOTE) The Xpert Xpress SARS-CoV-2/FLU/RSV assay is intended as an aid in  the diagnosis of influenza from Nasopharyngeal swab specimens and  should not be used as a sole basis for treatment. Nasal washings  and  aspirates are unacceptable for Xpert Xpress SARS-CoV-2/FLU/RSV  testing.  Fact Sheet for Patients: PinkCheek.be  Fact Sheet for Healthcare Providers: GravelBags.it  This test is not yet approved or cleared by the [place] FDA and  has been authorized for detection and/or diagnosis of SARS-CoV-2 by  FDA under an Emergency Use Authorization (EUA). This EUA will remain  in effect (meaning this test can be used) for the duration of the  Covid-19 declaration under Section 564(b)(1) of the Act, 21  U.S.C. section 360bbb-3(b)(1), unless the authorization is  terminated or revoked. Performed at [facility], [place] [address] [place] [employer] [place], [place] [address]   Lactic acid, plasma     Status: None   Collection Time: 09/04/20  2:17 PM  Result Value  Ref Range   Lactic Acid, Venous 1.0 0.5 - 1.9 mmol/L    Comment: Performed at [facility], [place] [address] [place], [place] [address]  Comprehensive metabolic panel     Status: Abnormal   Collection Time: 09/04/20  2:17 PM  Result Value Ref Range   Sodium 128 (L) 135 - 145 mmol/L   Potassium 3.7 3.5 - 5.1 mmol/L   Chloride 96 (L) 98 - 111 mmol/L   CO2 21 (L) 22 - 32 mmol/L   Glucose, Bld 112 (H) 70 - 99 mg/dL    Comment: Glucose reference range applies only to samples taken after fasting for at least 8 hours.   BUN 30 (H) 8 - 23 mg/dL   Creatinine, Ser 1.31 (H) 0.44 - 1.00 mg/dL   Calcium 9.6 8.9 - 10.3 mg/dL   Total Protein 8.9 (H) 6.5 - 8.1 g/dL   Albumin 3.4 (L) 3.5 - 5.0 g/dL   AST 21 15 - 41 U/L   ALT 10 0 - 44 U/L   Alkaline Phosphatase 74 38 - 126 U/L   Total Bilirubin 2.6 (H) 0.3 - 1.2 mg/dL   GFR, Estimated 43 (L) >60 mL/min   Anion gap 11 5 - 15    Comment: Performed at [facility], [place] [address], [place], [place] [address]  CBC WITH DIFFERENTIAL     Status: Abnormal   Collection Time: 09/04/20  2:17 PM  Result Value Ref Range   WBC 18.3 (H) 4.0 - 10.5 K/uL   RBC 3.63 (L) 3.87 - 5.11 MIL/uL   Hemoglobin 11.3 (L) 12.0 - 15.0 g/dL   HCT 36.8 36 - 46 %   MCV 101.4 (H) 80.0 - 100.0 fL   MCH 31.1 26.0 - 34.0 pg   MCHC 30.7 30.0 - 36.0 g/dL   RDW 19.9 (H) 11.5 - 15.5 %   Platelets 190 150 - 400 K/uL   nRBC 0.0 0.0 - 0.2 %   Neutrophils Relative % 88 %   Neutro Abs 16.1 (H) 1.7 - 7.7 K/uL   Lymphocytes Relative 7 %   Lymphs Abs 1.2 0.7 - 4.0 K/uL   Monocytes Relative 3 %   Monocytes Absolute 0.5 0.1 - 1.0 K/uL   Eosinophils Relative 0 %   Eosinophils Absolute 0.1 0.0 - 0.5 K/uL   Basophils Relative 0 %   Basophils Absolute 0.0 0.0 - 0.1 K/uL   Immature Granulocytes 2 %   Abs Immature Granulocytes 0.28 (H) 0.00 - 0.07 K/uL    Comment: Performed at [facility], [place] [address], [place], [place] [address]   Protime-INR     Status: Abnormal   Collection Time: 09/04/20  2:17 PM  Result Value Ref Range   Prothrombin Time 17.1 (H) 11.4 - 15.2 seconds   INR 1.5 (H) 0.8 - 1.2    Comment: (NOTE) INR goal varies based on device and disease states. Performed at [facility], [place] [address] [place], [place] [address]   APTT     Status: Abnormal   Collection Time: 09/04/20  2:17 PM  Result Value Ref Range   aPTT 37 (H) 24 - 36 seconds    Comment:        IF BASELINE aPTT IS ELEVATED, SUGGEST PATIENT RISK ASSESSMENT BE USED TO DETERMINE APPROPRIATE ANTICOAGULANT THERAPY. Performed at [facility], [place] [address], [place], [place] [address]   Ammonia     Status: None   Collection Time: 09/04/20  2:18 PM  Result Value Ref Range   Ammonia 18 9 - 35 umol/L    Comment: Performed at [facility], [place] [address], [place], [place] [address]  Magnesium     Status: None   Collection Time: 09/04/20  2:18 PM  Result Value Ref Range   Magnesium 1.7 1.7 - 2.4 mg/dL    Comment: Performed at [facility], [place] [address] [place], [place] [address]  Blood gas, venous (at [facility] and [facility], not at [facility])     Status: Abnormal   Collection Time: 09/04/20  2:18 PM  Result Value Ref Range   pH, Ven 7.301 7.25 - 7.43   pCO2, Ven 47.6 44 - 60 mmHg   pO2, Ven 35.7 32 - 45 mmHg   Bicarbonate 22.8 20.0 - 28.0 mmol/L   Acid-base deficit 3.2 (H) 0.0 - 2.0 mmol/L   O2 Saturation 61.0 %   Patient temperature 98.6     Comment: Performed at [facility], [place] [address] [place], [place] [address]  POC CBG, ED     Status: None   Collection Time: 09/04/20  2:26 PM  Result Value Ref Range   Glucose-Capillary 94 70 - 99 mg/dL    Comment: Glucose reference range applies only to samples taken after fasting for at least 8 hours.   Comment 1 Notify RN   POC SARS Coronavirus 2 Ag-ED - Nasal Swab (BD Veritor Kit)     Status: None    Collection Time: 09/04/20  2:47 PM  Result Value Ref Range   SARS Coronavirus 2 Ag NEGATIVE NEGATIVE    Comment: (NOTE) SARS-CoV-2 antigen NOT DETECTED.   Negative results are presumptive.  Negative results do not preclude SARS-CoV-2 infection and should not be used as the sole basis for treatment or other patient management decisions, including infection  control decisions, particularly in the presence of clinical signs and  symptoms consistent with COVID-19, or in those who have been in contact with the virus.  Negative results must be combined with clinical observations, patient history, and epidemiological information. The expected result is Negative.  Fact Sheet for Patients: PodPark.tn  Fact Sheet for Healthcare Providers: GiftContent.is   This test is not yet approved or cleared by the [place] FDA and  has been authorized for detection and/or diagnosis of SARS-CoV-2 by FDA under an Emergency Use Authorization (EUA).  This EUA will remain in effect (meaning this test can be used) for the duration of  the C OVID-19 declaration under Section 564(b)(1) of the Act, 21 U.S.C. section 360bbb-3(b)(1), unless the authorization is terminated or revoked sooner.    Urinalysis, Routine w reflex microscopic     Status:  Abnormal   Collection Time: 09/04/20  3:27 PM  Result Value Ref Range   Color, Urine AMBER (A) YELLOW    Comment: BIOCHEMICALS MAY BE AFFECTED BY COLOR   APPearance CLOUDY (A) CLEAR   Specific Gravity, Urine 1.017 1.005 - 1.030   pH 5.0 5.0 - 8.0   Glucose, UA NEGATIVE NEGATIVE mg/dL   Hgb urine dipstick SMALL (A) NEGATIVE   Bilirubin Urine NEGATIVE NEGATIVE   Ketones, ur 5 (A) NEGATIVE mg/dL   Protein, ur >=300 (A) NEGATIVE mg/dL   Nitrite NEGATIVE NEGATIVE   Leukocytes,Ua NEGATIVE NEGATIVE   RBC / HPF 11-20 0 - 5 RBC/hpf   WBC, UA 0-5 0 - 5 WBC/hpf   Bacteria, UA RARE (A) NONE SEEN   Squamous  Epithelial / LPF 0-5 0 - 5   Mucus PRESENT    Amorphous Crystal PRESENT     Comment: Performed at [facility], [place] [address], [place], [place] [address]  Rapid urine drug screen (hospital performed)     Status: None   Collection Time: 09/04/20  3:27 PM  Result Value Ref Range   Opiates NONE DETECTED NONE DETECTED   Cocaine NONE DETECTED NONE DETECTED   Benzodiazepines NONE DETECTED NONE DETECTED   Amphetamines NONE DETECTED NONE DETECTED   Tetrahydrocannabinol NONE DETECTED NONE DETECTED   Barbiturates NONE DETECTED NONE DETECTED    Comment: (NOTE) DRUG SCREEN FOR MEDICAL PURPOSES ONLY.  IF CONFIRMATION IS NEEDED FOR ANY PURPOSE, NOTIFY LAB WITHIN 5 DAYS.  LOWEST DETECTABLE LIMITS FOR URINE DRUG SCREEN Drug Class                     Cutoff (ng/mL) Amphetamine and metabolites    1000 Barbiturate and metabolites    200 Benzodiazepine                 604 Tricyclics and metabolites     300 Opiates and metabolites        300 Cocaine and metabolites        300 THC                            50 Performed at [facility], [place] [address], [place], [place] [address]   CBG monitoring, ED     Status: None   Collection Time: 09/04/20  4:03 PM  Result Value Ref Range   Glucose-Capillary 79 70 - 99 mg/dL    Comment: Glucose reference range applies only to samples taken after fasting for at least 8 hours.  Lactic acid, plasma     Status: None   Collection Time: 09/04/20  4:20 PM  Result Value Ref Range   Lactic Acid, Venous 0.9 0.5 - 1.9 mmol/L    Comment: Performed at [facility], [place] [address], [place], [place] [address]  Ethanol     Status: None   Collection Time: 09/04/20  4:20 PM  Result Value Ref Range   Alcohol, Ethyl (B) <10 <10 mg/dL    Comment: (NOTE) Lowest detectable limit for serum alcohol is 10 mg/dL.  For medical purposes only. Performed at [facility], [place] [address] [place],  [place] [address]   CBG monitoring, ED     Status: None   Collection Time: 09/04/20  5:04 PM  Result Value Ref Range   Glucose-Capillary 83 70 - 99 mg/dL    Comment: Glucose reference range applies only to samples  taken after fasting for at least 8 hours.  CBG monitoring, ED     Status: None   Collection Time: 09/04/20  6:18 PM  Result Value Ref Range   Glucose-Capillary 86 70 - 99 mg/dL    Comment: Glucose reference range applies only to samples taken after fasting for at least 8 hours.   CT ABDOMEN PELVIS WO CONTRAST  Result Date: 09/04/2020 CLINICAL DATA:  Left lower quadrant abdominal pain.  Dyspnea. EXAM: CT ABDOMEN AND PELVIS WITHOUT CONTRAST TECHNIQUE: Multidetector CT imaging of the abdomen and pelvis was performed following the standard protocol without IV contrast. COMPARISON:  09/02/2020 CT abdomen/pelvis. FINDINGS: Limited motion degraded scan. Lower chest: Trace dependent bilateral pleural effusions with mild-to-moderate bibasilar atelectasis, increased. Cardiomegaly. Trace pericardial effusion/thickening, unchanged. Hepatobiliary: Normal liver size. Numerous simple cysts scattered throughout the liver, largest 3.4 cm in the left liver lobe. Numerous subcentimeter hypodense lesions scattered throughout the liver are too small to characterize and are not appreciably changed. No appreciable new liver lesions. Normal gallbladder with no radiopaque cholelithiasis. No biliary ductal dilatation. Pancreas: Normal, with no mass or duct dilation. Spleen: Mild splenomegaly. Craniocaudal splenic length 13.6 cm, stable. No splenic mass. Adrenals/Urinary Tract: Normal adrenals. Prominently enlarged polycystic kidneys bilaterally. No hydronephrosis. No renal stones. Several of the renal cysts demonstrate mural calcifications in the kidneys bilaterally, unchanged. Dominant exophytic 7.1 cm lateral interpolar right renal cyst and dominant 7.6 cm lower left renal cyst. No overtly suspicious renal masses on this  noncontrast scan. Right lower quadrant transplant kidney demonstrates no hydronephrosis, no renal stones, no contour deforming renal masses and no perinephric collections. Bladder is nondistended and is obscured by streak artifact from left hip hardware with no gross bladder abnormality. Stomach/Bowel: Normal non-distended stomach. Normal caliber small bowel with no small bowel wall thickening. Normal appendix. Moderate sigmoid diverticulosis. Mild wall thickening with associated mild pericolonic fat stranding in the mid sigmoid colon compatible with acute sigmoid diverticulitis, slightly improved compared to the CT study performed 2 days prior. No pericolonic free air or abscess. Vascular/Lymphatic: Atherosclerotic nonaneurysmal abdominal aorta. No pathologically enlarged lymph nodes in the abdomen or pelvis. Reproductive: Grossly normal uterus.  No adnexal mass. Other: No pneumoperitoneum. Small simple 2.5 x 1.8 cm cystic focus associated with an apparent small left inguinal hernia, unchanged (series 3/image 87). Small to moderate fat containing right periumbilical hernia is unchanged. Musculoskeletal: No aggressive appearing focal osseous lesions. Left total hip arthroplasty. Healed deformities in the left pubic rami. Mild thoracolumbar spondylosis. IMPRESSION: 1. Acute sigmoid diverticulitis, slightly improved compared to the CT study performed 2 days prior. No free air or abscess. 2. Trace dependent bilateral pleural effusions with mild-to-moderate bibasilar atelectasis, increased. 3. Cardiomegaly. Trace pericardial effusion/thickening, unchanged. 4. Prominently enlarged polycystic kidneys bilaterally, unchanged. No hydronephrosis. Right lower quadrant transplant kidney is unremarkable. 5. Stable mild splenomegaly. 6. Stable small simple 2.5 cm cystic focus associated with an apparent small left inguinal hernia. 7. Stable small to moderate fat containing right periumbilical hernia. 8. Aortic Atherosclerosis  (ICD10-I70.0). Electronically Signed   By: Ilona Sorrel M.D.   On: 09/04/2020 16:27   CT ABDOMEN PELVIS WO CONTRAST  Result Date: 09/02/2020 CLINICAL DATA:  64 year old female with concern for bowel obstruction secondary to umbilical hernia. EXAM: CT ABDOMEN AND PELVIS WITHOUT CONTRAST TECHNIQUE: Multidetector CT imaging of the abdomen and pelvis was performed following the standard protocol without IV contrast. COMPARISON:  CT abdomen pelvis dated 05/14/2020. FINDINGS: Evaluation of this exam is limited in the absence of intravenous contrast.  Lower chest: There are bibasilar linear atelectasis/scarring. There is trace right pleural effusion. Coronary vascular calcifications noted. No intra-abdominal free air or free fluid. Hepatobiliary: Multiple liver cysts as seen on the prior CT. No intrahepatic biliary ductal dilatation. There is sludge in the gallbladder. No pericholecystic fluid. Pancreas: Unremarkable. No pancreatic ductal dilatation or surrounding inflammatory changes. Spleen: Normal in size without focal abnormality. Adrenals/Urinary Tract: The adrenal glands are unremarkable. Polycystic kidneys with replaced renal parenchyma by innumerable cysts. Scattered parenchymal calcifications noted. There is no hydronephrosis or obstructing stone. There is a right lower quadrant renal transplant. There is no hydronephrosis or nephrolithiasis of the transplant kidney. No peritransplant fluid collection. The urinary bladder is unremarkable. Stomach/Bowel: There is sigmoid diverticulosis. There is inflammatory changes centered at a sigmoid diverticula consistent with acute diverticulitis. There is no bowel obstruction. Normal appendix. Vascular/Lymphatic: Moderate aortoiliac atherosclerotic disease. The IVC is unremarkable. No portal venous gas. There is no adenopathy. Reproductive: The uterus is retroflexed. No adnexal masses. Other: There is a small fat containing umbilical hernia. There is mild stranding of  the herniated fat, likely chronic. Correlation with clinical exam and point tenderness recommended to exclude strangulation/incarceration. No fluid collection. Musculoskeletal: Degenerative changes of the spine. Old left pubic bone fracture. Left hip arthroplasty. No acute osseous pathology. Bilateral sacral al a insufficiency fractures similar to prior CT. IMPRESSION: 1. Sigmoid diverticulitis.  No diverticular abscess or perforation. 2. No bowel obstruction. Normal appendix. 3. Small fat containing umbilical hernia with mild stranding of the herniated fat, likely chronic. Correlation with clinical exam and point tenderness recommended to exclude strangulation/incarceration. 4. Polycystic kidneys and liver. 5. Aortic Atherosclerosis (ICD10-I70.0). Electronically Signed   By: Anner Crete M.D.   On: 09/02/2020 22:27   CT Head Wo Contrast  Result Date: 09/04/2020 CLINICAL DATA:  Delirium. EXAM: CT HEAD WITHOUT CONTRAST TECHNIQUE: Contiguous axial images were obtained from the base of the skull through the vertex without intravenous contrast. COMPARISON:  Head CT 04/15/2020. FINDINGS: Brain: There is no acute intracranial hemorrhage. No demarcated cortical infarct. No extra-axial fluid collection. No evidence of intracranial mass. No midline shift. Vascular: No hyperdense vessel. Skull: Normal. Negative for fracture or focal lesion. Sinuses/Orbits: Visualized orbits show no acute finding. No significant paranasal sinus disease or mastoid effusion at the imaged levels. IMPRESSION: No evidence of acute intracranial abnormality. Electronically Signed   By: Kellie Simmering DO   On: 09/04/2020 16:22   DG Chest Port 1 View  Result Date: 09/04/2020 CLINICAL DATA:  Questionable sepsis.  Evaluate for abnormality. EXAM: PORTABLE CHEST 1 VIEW COMPARISON:  Multiple priors, most recent 07/18/2020 FINDINGS: Cardiac silhouette is similar to priors and accentuated by low lung volumes and portable technique. Low lung  volumes with streaky bibasilar opacities. No visible pleural effusions or pneumothorax. No acute osseous abnormality. IMPRESSION: Low lung volumes with streaky bibasilar opacities, favor atelectasis. Electronically Signed   By: Margaretha [place] MD   On: 09/04/2020 14:57    Pending Labs Unresulted Labs (From admission, onward)          Start     Ordered   09/04/20 1813  Brain natriuretic peptide  Add-on,   AD        09/04/20 1812   09/04/20 1811  MRSA PCR Screening  ONCE - STAT,   STAT        09/04/20 1810   09/04/20 1329  Blood Culture (routine x 2)  (Septic presentation on arrival (screening labs, nursing and treatment orders for obvious sepsis))  BLOOD CULTURE X 2,   STAT      09/04/20 1329   09/04/20 1329  Urine culture  (Septic presentation on arrival (screening labs, nursing and treatment orders for obvious sepsis))  ONCE - STAT,   STAT        09/04/20 1329   Signed and Held  CBC  (enoxaparin (LOVENOX)    CrCl >/= 30 ml/min)  Once,   R       Comments: Baseline for enoxaparin therapy IF NOT ALREADY DRAWN.  Notify MD if PLT < 100 K.    Signed and Held   Signed and Held  Creatinine, serum  (enoxaparin (LOVENOX)    CrCl >/= 30 ml/min)  Once,   R       Comments: Baseline for enoxaparin therapy IF NOT ALREADY DRAWN.    Signed and Held   Signed and Held  Creatinine, serum  (enoxaparin (LOVENOX)    CrCl >/= 30 ml/min)  Weekly,   R     Comments: while on enoxaparin therapy    Signed and Held   Signed and Held  Comprehensive metabolic panel  Tomorrow morning,   R        Signed and Held   Signed and Held  CBC  Tomorrow morning,   R        Signed and Held          Vitals/Pain Today's Vitals   09/04/20 1630 09/04/20 1700 09/04/20 1730 09/04/20 1800  BP: 127/73 118/65 127/60 131/66  Pulse: 100 (!) 101 96 95  Resp: (!) 26 (!) 24 (!) 28 (!) 25  Temp:      TempSrc:      SpO2: 100% 100% 100% 100%  Weight:      Height:      PainSc:        Isolation Precautions Airborne and  Contact precautions  Medications Medications  lactated ringers infusion ( Intravenous Stopped 09/04/20 1446)  albuterol (VENTOLIN HFA) 108 (90 Base) MCG/ACT inhaler 2 puff (2 puffs Inhalation Not Given 09/04/20 1435)  MEDLINE mouth rinse (has no administration in time range)  Chlorhexidine Gluconate Cloth 2 % PADS 6 each (has no administration in time range)  dextrose 10 % infusion (has no administration in time range)  cefTRIAXone (ROCEPHIN) 2 g in sodium chloride 0.9 % 100 mL IVPB (0 g Intravenous Stopped 09/04/20 1445)  metroNIDAZOLE (FLAGYL) IVPB 500 mg (0 mg Intravenous Stopped 09/04/20 1522)  lactated ringers bolus 500 mL (0 mLs Intravenous Stopped 09/04/20 1445)  hydrocortisone sodium succinate (SOLU-CORTEF) 100 MG injection 100 mg (100 mg Intravenous Given 09/04/20 1637)    Mobility non-ambulatory

## 2020-09-05 ENCOUNTER — Inpatient Hospital Stay (HOSPITAL_COMMUNITY): Payer: Medicare Other

## 2020-09-05 DIAGNOSIS — J9611 Chronic respiratory failure with hypoxia: Secondary | ICD-10-CM

## 2020-09-05 DIAGNOSIS — E2740 Unspecified adrenocortical insufficiency: Secondary | ICD-10-CM

## 2020-09-05 DIAGNOSIS — E871 Hypo-osmolality and hyponatremia: Secondary | ICD-10-CM

## 2020-09-05 DIAGNOSIS — O85 Puerperal sepsis: Secondary | ICD-10-CM

## 2020-09-05 DIAGNOSIS — R0602 Shortness of breath: Secondary | ICD-10-CM

## 2020-09-05 DIAGNOSIS — R4182 Altered mental status, unspecified: Secondary | ICD-10-CM

## 2020-09-05 DIAGNOSIS — I5032 Chronic diastolic (congestive) heart failure: Secondary | ICD-10-CM

## 2020-09-05 LAB — URINE CULTURE: Culture: NO GROWTH

## 2020-09-05 LAB — CBC
HCT: 32.7 % — ABNORMAL LOW (ref 36.0–46.0)
Hemoglobin: 10 g/dL — ABNORMAL LOW (ref 12.0–15.0)
MCH: 31 pg (ref 26.0–34.0)
MCHC: 30.6 g/dL (ref 30.0–36.0)
MCV: 101.2 fL — ABNORMAL HIGH (ref 80.0–100.0)
Platelets: 167 10*3/uL (ref 150–400)
RBC: 3.23 MIL/uL — ABNORMAL LOW (ref 3.87–5.11)
RDW: 19.6 % — ABNORMAL HIGH (ref 11.5–15.5)
WBC: 11.2 10*3/uL — ABNORMAL HIGH (ref 4.0–10.5)
nRBC: 0 % (ref 0.0–0.2)

## 2020-09-05 LAB — COMPREHENSIVE METABOLIC PANEL
ALT: 8 U/L (ref 0–44)
AST: 18 U/L (ref 15–41)
Albumin: 2.8 g/dL — ABNORMAL LOW (ref 3.5–5.0)
Alkaline Phosphatase: 59 U/L (ref 38–126)
Anion gap: 13 (ref 5–15)
BUN: 33 mg/dL — ABNORMAL HIGH (ref 8–23)
CO2: 21 mmol/L — ABNORMAL LOW (ref 22–32)
Calcium: 9.4 mg/dL (ref 8.9–10.3)
Chloride: 100 mmol/L (ref 98–111)
Creatinine, Ser: 1.24 mg/dL — ABNORMAL HIGH (ref 0.44–1.00)
GFR, Estimated: 46 mL/min — ABNORMAL LOW (ref >60)
Glucose, Bld: 164 mg/dL — ABNORMAL HIGH (ref 70–99)
Potassium: 4.3 mmol/L (ref 3.5–5.1)
Sodium: 134 mmol/L — ABNORMAL LOW (ref 135–145)
Total Bilirubin: 1.6 mg/dL — ABNORMAL HIGH (ref 0.3–1.2)
Total Protein: 7.7 g/dL (ref 6.5–8.1)

## 2020-09-05 LAB — GLUCOSE, CAPILLARY
Glucose-Capillary: 119 mg/dL — ABNORMAL HIGH (ref 70–99)
Glucose-Capillary: 136 mg/dL — ABNORMAL HIGH (ref 70–99)
Glucose-Capillary: 144 mg/dL — ABNORMAL HIGH (ref 70–99)
Glucose-Capillary: 149 mg/dL — ABNORMAL HIGH (ref 70–99)
Glucose-Capillary: 153 mg/dL — ABNORMAL HIGH (ref 70–99)
Glucose-Capillary: 153 mg/dL — ABNORMAL HIGH (ref 70–99)

## 2020-09-05 LAB — PROCALCITONIN: Procalcitonin: 1.58 ng/mL

## 2020-09-05 MED ORDER — BUDESON-GLYCOPYRROL-FORMOTEROL 160-9-4.8 MCG/ACT IN AERO
2.0000 | INHALATION_SPRAY | Freq: Two times a day (BID) | RESPIRATORY_TRACT | Status: DC
  Administered 2020-09-06 – 2020-09-10 (×9): 2 via RESPIRATORY_TRACT

## 2020-09-05 MED ORDER — MOMETASONE FURO-FORMOTEROL FUM 200-5 MCG/ACT IN AERO
2.0000 | INHALATION_SPRAY | Freq: Two times a day (BID) | RESPIRATORY_TRACT | Status: DC
  Filled 2020-09-05: qty 8.8

## 2020-09-05 MED ORDER — UMECLIDINIUM BROMIDE 62.5 MCG/INH IN AEPB
1.0000 | INHALATION_SPRAY | Freq: Every day | RESPIRATORY_TRACT | Status: DC
  Filled 2020-09-05: qty 7

## 2020-09-05 NOTE — Progress Notes (Signed)
Initial Nutrition Assessment  RD working remotely.  DOCUMENTATION CODES:   Not applicable  INTERVENTION:  - diet advancement as medically feasible.    NUTRITION DIAGNOSIS:   Increased nutrient needs related to acute illness as evidenced by estimated needs.  GOAL:   Patient will meet greater than or equal to 90% of their needs  MONITOR:   Diet advancement, Labs, Weight trends, I & O's  REASON FOR ASSESSMENT:   Malnutrition Screening Tool  ASSESSMENT:   64 y.o. 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 She presented to the ED with abdominal pain and confusion x1 week. She developed N/V and labored breathing on 10/11. She was seen in the [facility] ED and was diagnosed with diverticulitis; sent home with abx and pain meds.  She has been NPO since admission. Noted confusion on admission with documentation currently indicating she is a/o to self and place with poor attention and delayed responses.   Weight today is 154 lb. Had been stable at 158-164 lb from 5/28-9/28. Weight on 9/28 was 162 lb. This indicates 8 lb weight loss (5% body weight) in the past 2.5 weeks; significant for time frame. No information documented in the edema section of flow sheet yet.   Per notes: - sepsis 2/2 sigmoid diverticulitis - hx of kidney transplant on chronic steroids and anti-rejection medications - hypoglycemia on admission - acute metabolic encephalopathy thought to be 2/2 sepsis and hypoglycemia - multiple myeloma - umbilical hernia, L inguinal hernia   Labs reviewed; CBGs: 153 and 136 mg/dl, Na: 134 mmol/l, BUN: 33 mg/dl, creatinine: 1.24 mg/dl, GFR: 46 ml/min.  Medications reviewed; 500 mg ascorbic acid/day, 1000 units cholecalciferol/day, 50 mg solu-cortef QID, 40 mg oral protonix BID, 1000 mcg oral cyanocobalamin/day. IVF; NS @ 50 ml/hr.    NUTRITION - FOCUSED  PHYSICAL EXAM:  unable to complete with not being on campus.   Diet Order:   Diet Order            Diet NPO time specified Except for: Sips with Meds  Diet effective now                 EDUCATION NEEDS:   No education needs have been identified at this time  Skin:  Skin Assessment: Skin Integrity Issues: Skin Integrity Issues:: Stage II Stage II: L buttocks  Last BM:  PTA/unknown  Height:   Ht Readings from Last 1 Encounters:  09/04/20 5' 1"  (1.549 m)    Weight:   Wt Readings from Last 1 Encounters:  09/05/20 69.7 kg     Estimated Nutritional Needs:  Kcal:  1740-1955 kcal Protein:  75-90 grams Fluid:  >/= 2.2 L/day     Jarome Matin, MS, RD, LDN, CNSC Inpatient Clinical Dietitian RD pager # available in [facility]  After hours/weekend pager # available in [facility]

## 2020-09-05 NOTE — Progress Notes (Signed)
Pt transferred from icu via wheelchair and is on 2L [place]. Pt does not appear to be in distress at this time. Pt oriented to unit and call bell is within reach. Bed is in lowest position will continue to monitor.

## 2020-09-05 NOTE — Progress Notes (Signed)
PT takes Breztri at home and will have her husband bring on 09/05/20 to hospital.RN aware.

## 2020-09-05 NOTE — Progress Notes (Signed)
PROGRESS NOTE    KOLBI TOFTE  QHU:[id number] DOB: 17-Aug-1956 DOA: 09/04/2020 PCP: [name], MD   Brief Narrative:   64 y.o. 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  History is obtained from patient, chart, and daughter (somewhat limited from pt as mildly lethargic, though improved as we spoke).  Her symptoms started about 1 week ago.  The pain lasted through the weekend, but on Monday she developed nausea and vomiting in addition to labored breathing.  She was seen at [facility] who recommended she go to [facility].  They diagnosed her with diverticulitis and sent her home with abx and pain meds.  After being home, she was unable to keep things down and continued to have nausea and vomiting.  Home health RN recommended she come to hospital today.  EMS noted glucometer read low.  She had confusion and lethargy.  + fevers to 101.5 on 10/12.  She notes some SOB.  Denies CP.  Notes continued abdominal pain.  Nausea and dry heaving.  Decreased appetite.  Dark urine.  ED Course: Labs, imaging. IVF, abx, IV steroids.  Imaging suggestive of diverticulitis.  Dextrose containing fluids for hypoglycemia.  Admit to hospitalists for sepsis 2/2 diverticulosis with adrenal insufficiency.  Review of Systems: As per HPI otherwise all other systems reviewed and are negative.   Subjective: Afebrile overnight, states negative CP, negative nausea, negative vomiting.  Abdominal pain with palpation   Assessment & Plan:  Covid vaccination; vaccinated  Active Problems:   Sepsis without acute organ dysfunction ([place])   Diverticulitis   Sepsis 2/2 Diverticulitis: tachypnea, intermittent tachycardia, leukocytosis with diverticulitis on imaging.  Also with acute metabolic encephalopathy.  BP's have been within appropriate range.  She's  immunocompromised with hx kidney transplant and on chronic steroids and antirejection meds.  CT abdomen/pelvis with acute sigmoid diverticulitis Lactic negative x2 Follow blood (only 1 blood cx obtained) and urine cx S/p ceftriaxone/flagyl in ED.  Start zosyn. S/p 500 cc bolus, will hold off on 30 cc/kg given hx HF, normal lactic acid and BP, and tachypnea on exam.  Gentle IVF for now.  Results for NORETTA, FRIER (MRN [id number]) as of 09/05/2020 07:34  Ref. Range 09/04/2020 14:17 09/04/2020 16:20  Lactic Acid, Venous Latest Ref Range: 0.5 - 1.9 mmol/L 1.0 0.9  -10/14 patient diagnosed with sepsis we will follow procalcitonin to determine antibiotic usage.  Hypoglycemia  Adrenal Insufficiency: 2/2 acute illness on chronic steroids with prednisone 7.5 mg daily for hx renal transplant.  EMS noted CBG reading low.   Start stress dose steroids Continue D10w at 40 cc/hr Q4H blood sugar check  Tachypnea  Shortness of Breath  Chronic Hypoxic Respiratory Failure: she's currently on her home oxygen (per previous notes, typically on ~3 L --- daughter notes she sometimes doesn't need her o2--- she's currently satting well on 2 L).   Currently tachypneic with some belly breathing -> VBG with normal pH/pCO2 Tachypnea possibly 2/2 sepsis above.  Will continue to monitor, caution with IVF given hx of HF.  Acute Metabolic Encephalopathy: likely 2/2 sepsis and hypoglycemia.  She's improving, but still lethargic.   Normal ammonia, pCO2 Follow TSH, B12, folate Delirium precautuions  Hx Polycystic Kidney Disease s/p renal transplant  CKD IIIb: (Baseline Cr ~1-1.2) Lab Results  Component Value Date   CREATININE 1.24 (H) 09/05/2020   CREATININE 1.31 (H) 09/04/2020  CREATININE 1.20 (H) 09/02/2020   CREATININE 1.30 (H) 08/05/2020   CREATININE 1.39 (H) 07/18/2020  -Basically baseline -Normal saline 2m/hr -Solu-Cortef 50 mg QID (stress dose steroids); begin to taper on 10/15 -Mycophenolate  180 mg BID -Tacrolimus 2 mg daily/1 mg qhs -Valacyclovir 500 mg BID  Abnormal UA:  -Hematuria -Urine culture pending  Chronic diastolic CHF -: echo 55/1700with EF 55-60%, grade II diastolic dysfunction, mildly elevated PASP.   -Hold lasix for now with sepsis.  Follow BNP. -Strict in and out -Daily weight -10/14 normal saline 75 ml/hr   COPD  Chronic Hypoxic Resp Failure: -Continue home meds  Smoldering multiple myeloma Follows with Dr. DLorenso Courieroutpatient Following q3 months outpatient   Gout: -Continue allopurinol  Hypertension: -Hold imdur, lasix in setting of sepsis.   -Continue verapamil for now.  GERD: PPI  Umbilical Hernia  Left Inguinal Hernia: -Fat containing umbilical hernia as well as L inguinal hernia with 2.5 cm cystic focus    DVT prophylaxis: Lovenox Code Status: Full Family Communication:  Status is: Inpatient    Dispo: The patient is from: Home              Anticipated d/c is to: Home              Anticipated d/c date is: 10/21              Patient currently unstable      Consultants:    Procedures/Significant Events:     I have personally reviewed and interpreted all radiology studies and my findings are as above.  VENTILATOR SETTINGS: Nasal cannula 10/14 Flow; 2 L/min SPO2 100%   Cultures 10/13 blood RIGHT AC NGTD 13 blood RIGHT hand NGTD 10/13 urine pending 10/13 MRSA by PCR negative   Antimicrobials: Anti-infectives (From admission, onward)   Start     Ordered Stop   09/04/20 2200  valACYclovir (VALTREX) tablet 500 mg        09/04/20 1934     09/04/20 2200  piperacillin-tazobactam (ZOSYN) IVPB 3.375 g        09/04/20 1837     09/04/20 1330  cefTRIAXone (ROCEPHIN) 2 g in sodium chloride 0.9 % 100 mL IVPB        09/04/20 1329 09/04/20 1445   09/04/20 1330  metroNIDAZOLE (FLAGYL) IVPB 500 mg        09/04/20 1329 09/04/20 1522       Devices    LINES / TUBES:      Continuous Infusions: . sodium  chloride Stopped (09/04/20 2150)  . dextrose 40 mL/hr at 09/05/20 0400  . piperacillin-tazobactam (ZOSYN)  IV 3.375 g (09/05/20 0545)     Objective: Vitals:   09/05/20 0350 09/05/20 0400 09/05/20 0500 09/05/20 0600  BP:  (!) 103/55 (!) 104/51 (!) 108/56  Pulse:  86 84 80  Resp:  19 19 (!) 21  Temp:      TempSrc:      SpO2:  99% 100% 100%  Weight: 69.7 kg     Height:        Intake/Output Summary (Last 24 hours) at 09/05/2020 0725 Last data filed at 09/05/2020 0600 Gross per 24 hour  Intake 1156.67 ml  Output 850 ml  Net 306.67 ml   Filed Weights   09/04/20 1325 09/04/20 1900 09/05/20 0350  Weight: 73 kg 68.3 kg 69.7 kg    Examination:  General: A/O x4, No acute respiratory distress Eyes: negative scleral hemorrhage, negative anisocoria, negative icterus ENT: Negative Runny  nose, negative gingival bleeding, mucosal dryness. Neck:  Negative scars, masses, torticollis, lymphadenopathy, JVD Lungs: Clear to auscultation bilaterally without wheezes or crackles Cardiovascular: Regular rate and rhythm without murmur gallop or rub normal S1 and S2 Abdomen: Positive abdominal pain palpation, nondistended, positive soft, bowel sounds, no rebound, no ascites, no appreciable mass Extremities: No significant cyanosis, clubbing, or edema bilateral lower extremities Skin: Negative rashes, lesions, ulcers Psychiatric:  Negative depression, negative anxiety, negative fatigue, negative mania  Central nervous system:  Cranial nerves II through XII intact, tongue/uvula midline, all extremities muscle strength 5/5, sensation intact throughout, negative dysarthria, negative expressive aphasia, negative receptive aphasia.  .     Data Reviewed: Care during the described time interval was provided by me .  I have reviewed this patient's available data, including medical history, events of note, physical examination, and all test results as part of my evaluation.   CBC: Recent Labs  Lab  09/02/20 2049 09/04/20 1417 09/05/20 0250  WBC 8.0 18.3* 11.2*  NEUTROABS  --  16.1*  --   HGB 10.6* 11.3* 10.0*  HCT 34.7* 36.8 32.7*  MCV 102.4* 101.4* 101.2*  PLT 207 190 494   Basic Metabolic Panel: Recent Labs  Lab 09/02/20 2049 09/04/20 1417 09/04/20 1418 09/05/20 0250  NA 134* 128*  --  134*  K 4.1 3.7  --  4.3  CL 101 96*  --  100  CO2 22 21*  --  21*  GLUCOSE 118* 112*  --  164*  BUN 29* 30*  --  33*  CREATININE 1.20* 1.31*  --  1.24*  CALCIUM 9.8 9.6  --  9.4  MG  --   --  1.7  --    GFR: Estimated Creatinine Clearance: 41.5 mL/min (A) (by C-G formula based on SCr of 1.24 mg/dL (H)). Liver Function Tests: Recent Labs  Lab 09/02/20 2049 09/04/20 1417 09/05/20 0250  AST 19 21 18   ALT 10 10 8   ALKPHOS 67 74 59  BILITOT 2.1* 2.6* 1.6*  PROT 8.4* 8.9* 7.7  ALBUMIN 3.4* 3.4* 2.8*   Recent Labs  Lab 09/02/20 2049  LIPASE 34   Recent Labs  Lab 09/04/20 1418  AMMONIA 18   Coagulation Profile: Recent Labs  Lab 09/04/20 1417  INR 1.5*   Cardiac Enzymes: No results for input(s): CKTOTAL, CKMB, CKMBINDEX, TROPONINI in the last 168 hours. BNP (last 3 results) No results for input(s): PROBNP in the last 8760 hours. HbA1C: No results for input(s): HGBA1C in the last 72 hours. CBG: Recent Labs  Lab 09/04/20 1704 09/04/20 1818 09/04/20 1946 09/04/20 2302 09/05/20 0315  GLUCAP 83 86 113* 143* 153*   Lipid Profile: No results for input(s): CHOL, HDL, LDLCALC, TRIG, CHOLHDL, LDLDIRECT in the last 72 hours. Thyroid Function Tests: Recent Labs    09/04/20 1417  TSH 1.544   Anemia Panel: Recent Labs    09/04/20 1417  VITAMINB12 2,749*  FOLATE 16.2   Urine analysis:    Component Value Date/Time   COLORURINE AMBER (A) 09/04/2020 1527   APPEARANCEUR CLOUDY (A) 09/04/2020 1527   LABSPEC 1.017 09/04/2020 1527   PHURINE 5.0 09/04/2020 1527   GLUCOSEU NEGATIVE 09/04/2020 1527   HGBUR SMALL (A) 09/04/2020 1527   BILIRUBINUR NEGATIVE  09/04/2020 1527   KETONESUR 5 (A) 09/04/2020 1527   PROTEINUR >=300 (A) 09/04/2020 1527   UROBILINOGEN 0.2 05/15/2013 0733   NITRITE NEGATIVE 09/04/2020 1527   LEUKOCYTESUR NEGATIVE 09/04/2020 1527   Sepsis Labs: @LABRCNTIP (procalcitonin:4,lacticidven:4)  ) Recent Results (  from the past 240 hour(s))  Respiratory Panel by RT PCR (Flu A&B, Covid) - Nasopharyngeal Swab     Status: None   Collection Time: 09/04/20  2:14 PM   Specimen: Nasopharyngeal Swab  Result Value Ref Range Status   SARS Coronavirus 2 by RT PCR NEGATIVE NEGATIVE Final    Comment: (NOTE) SARS-CoV-2 target nucleic acids are NOT DETECTED.  The SARS-CoV-2 RNA is generally detectable in upper respiratoy specimens during the acute phase of infection. The lowest concentration of SARS-CoV-2 viral copies this assay can detect is 131 copies/mL. A negative result does not preclude SARS-Cov-2 infection and should not be used as the sole basis for treatment or other patient management decisions. A negative result may occur with  improper specimen collection/handling, submission of specimen other than nasopharyngeal swab, presence of viral mutation(s) within the areas targeted by this assay, and inadequate number of viral copies (<131 copies/mL). A negative result must be combined with clinical observations, patient history, and epidemiological information. The expected result is Negative.  Fact Sheet for Patients:  PinkCheek.be  Fact Sheet for Healthcare Providers:  GravelBags.it  This test is no t yet approved or cleared by the [place] FDA and  has been authorized for detection and/or diagnosis of SARS-CoV-2 by FDA under an Emergency Use Authorization (EUA). This EUA will remain  in effect (meaning this test can be used) for the duration of the COVID-19 declaration under Section 564(b)(1) of the Act, 21 U.S.C. section 360bbb-3(b)(1), unless the  authorization is terminated or revoked sooner.     Influenza A by PCR NEGATIVE NEGATIVE Final   Influenza B by PCR NEGATIVE NEGATIVE Final    Comment: (NOTE) The Xpert Xpress SARS-CoV-2/FLU/RSV assay is intended as an aid in  the diagnosis of influenza from Nasopharyngeal swab specimens and  should not be used as a sole basis for treatment. Nasal washings and  aspirates are unacceptable for Xpert Xpress SARS-CoV-2/FLU/RSV  testing.  Fact Sheet for Patients: PinkCheek.be  Fact Sheet for Healthcare Providers: GravelBags.it  This test is not yet approved or cleared by the [place] FDA and  has been authorized for detection and/or diagnosis of SARS-CoV-2 by  FDA under an Emergency Use Authorization (EUA). This EUA will remain  in effect (meaning this test can be used) for the duration of the  Covid-19 declaration under Section 564(b)(1) of the Act, 21  U.S.C. section 360bbb-3(b)(1), unless the authorization is  terminated or revoked. Performed at [facility], [place] [address], [place], [place] [address]   MRSA PCR Screening     Status: None   Collection Time: 09/04/20  6:11 PM   Specimen: Nasal Mucosa; Nasopharyngeal  Result Value Ref Range Status   MRSA by PCR NEGATIVE NEGATIVE Final    Comment:        The GeneXpert MRSA Assay (FDA approved for NASAL specimens only), is one component of a comprehensive MRSA colonization surveillance program. It is not intended to diagnose MRSA infection nor to guide or monitor treatment for MRSA infections. Performed at [facility], [place] [address], [place], [place] [address]          Radiology Studies: CT ABDOMEN PELVIS WO CONTRAST  Result Date: 09/04/2020 CLINICAL DATA:  Left lower quadrant abdominal pain.  Dyspnea. EXAM: CT ABDOMEN AND PELVIS WITHOUT CONTRAST TECHNIQUE: Multidetector CT imaging of the abdomen and pelvis was  performed following the standard protocol without IV contrast. COMPARISON:  09/02/2020 CT abdomen/pelvis. FINDINGS: Limited motion degraded scan. Lower  chest: Trace dependent bilateral pleural effusions with mild-to-moderate bibasilar atelectasis, increased. Cardiomegaly. Trace pericardial effusion/thickening, unchanged. Hepatobiliary: Normal liver size. Numerous simple cysts scattered throughout the liver, largest 3.4 cm in the left liver lobe. Numerous subcentimeter hypodense lesions scattered throughout the liver are too small to characterize and are not appreciably changed. No appreciable new liver lesions. Normal gallbladder with no radiopaque cholelithiasis. No biliary ductal dilatation. Pancreas: Normal, with no mass or duct dilation. Spleen: Mild splenomegaly. Craniocaudal splenic length 13.6 cm, stable. No splenic mass. Adrenals/Urinary Tract: Normal adrenals. Prominently enlarged polycystic kidneys bilaterally. No hydronephrosis. No renal stones. Several of the renal cysts demonstrate mural calcifications in the kidneys bilaterally, unchanged. Dominant exophytic 7.1 cm lateral interpolar right renal cyst and dominant 7.6 cm lower left renal cyst. No overtly suspicious renal masses on this noncontrast scan. Right lower quadrant transplant kidney demonstrates no hydronephrosis, no renal stones, no contour deforming renal masses and no perinephric collections. Bladder is nondistended and is obscured by streak artifact from left hip hardware with no gross bladder abnormality. Stomach/Bowel: Normal non-distended stomach. Normal caliber small bowel with no small bowel wall thickening. Normal appendix. Moderate sigmoid diverticulosis. Mild wall thickening with associated mild pericolonic fat stranding in the mid sigmoid colon compatible with acute sigmoid diverticulitis, slightly improved compared to the CT study performed 2 days prior. No pericolonic free air or abscess. Vascular/Lymphatic: Atherosclerotic  nonaneurysmal abdominal aorta. No pathologically enlarged lymph nodes in the abdomen or pelvis. Reproductive: Grossly normal uterus.  No adnexal mass. Other: No pneumoperitoneum. Small simple 2.5 x 1.8 cm cystic focus associated with an apparent small left inguinal hernia, unchanged (series 3/image 87). Small to moderate fat containing right periumbilical hernia is unchanged. Musculoskeletal: No aggressive appearing focal osseous lesions. Left total hip arthroplasty. Healed deformities in the left pubic rami. Mild thoracolumbar spondylosis. IMPRESSION: 1. Acute sigmoid diverticulitis, slightly improved compared to the CT study performed 2 days prior. No free air or abscess. 2. Trace dependent bilateral pleural effusions with mild-to-moderate bibasilar atelectasis, increased. 3. Cardiomegaly. Trace pericardial effusion/thickening, unchanged. 4. Prominently enlarged polycystic kidneys bilaterally, unchanged. No hydronephrosis. Right lower quadrant transplant kidney is unremarkable. 5. Stable mild splenomegaly. 6. Stable small simple 2.5 cm cystic focus associated with an apparent small left inguinal hernia. 7. Stable small to moderate fat containing right periumbilical hernia. 8. Aortic Atherosclerosis (ICD10-I70.0). Electronically Signed   By: Ilona Sorrel M.D.   On: 09/04/2020 16:27   CT Head Wo Contrast  Result Date: 09/04/2020 CLINICAL DATA:  Delirium. EXAM: CT HEAD WITHOUT CONTRAST TECHNIQUE: Contiguous axial images were obtained from the base of the skull through the vertex without intravenous contrast. COMPARISON:  Head CT 04/15/2020. FINDINGS: Brain: There is no acute intracranial hemorrhage. No demarcated cortical infarct. No extra-axial fluid collection. No evidence of intracranial mass. No midline shift. Vascular: No hyperdense vessel. Skull: Normal. Negative for fracture or focal lesion. Sinuses/Orbits: Visualized orbits show no acute finding. No significant paranasal sinus disease or mastoid effusion  at the imaged levels. IMPRESSION: No evidence of acute intracranial abnormality. Electronically Signed   By: Kellie Simmering DO   On: 09/04/2020 16:22   DG Chest Port 1 View  Result Date: 09/04/2020 CLINICAL DATA:  Questionable sepsis.  Evaluate for abnormality. EXAM: PORTABLE CHEST 1 VIEW COMPARISON:  Multiple priors, most recent 07/18/2020 FINDINGS: Cardiac silhouette is similar to priors and accentuated by low lung volumes and portable technique. Low lung volumes with streaky bibasilar opacities. No visible pleural effusions or pneumothorax. No acute osseous abnormality. IMPRESSION: Low  lung volumes with streaky bibasilar opacities, favor atelectasis. Electronically Signed   By: Margaretha Sheffield MD   On: 09/04/2020 14:57        Scheduled Meds: . albuterol  2 puff Inhalation Once  . allopurinol  300 mg Oral Daily  . ascorbic acid  500 mg Oral Daily  . aspirin  81 mg Oral Daily  . brimonidine  1 drop Both Eyes BID  . Budeson-Glycopyrrol-Formoterol  2 puff Inhalation BID  . Chlorhexidine Gluconate Cloth  6 each Topical Daily  . cholecalciferol  1,000 Units Oral Daily  . cycloSPORINE  1 drop Both Eyes BID  . dorzolamide  1 drop Both Eyes Daily  . enoxaparin (LOVENOX) injection  40 mg Subcutaneous Q24H  . gabapentin  200 mg Oral TID  . hydrocortisone sod succinate (SOLU-CORTEF) inj  50 mg Intravenous Q6H  . latanoprost  1 drop Both Eyes QHS  . mouth rinse  15 mL Mouth Rinse BID  . mycophenolate  180 mg Oral BID  . pantoprazole  40 mg Oral BID  . tacrolimus  2 mg Oral Daily   And  . tacrolimus  1 mg Oral QHS  . valACYclovir  500 mg Oral BID  . verapamil  240 mg Oral Daily  . cyanocobalamin  1,000 mcg Oral Daily   Continuous Infusions: . sodium chloride Stopped (09/04/20 2150)  . dextrose 40 mL/hr at 09/05/20 0400  . piperacillin-tazobactam (ZOSYN)  IV 3.375 g (09/05/20 0545)     LOS: 1 day   The patient is critically ill with multiple organ systems failure and requires high  complexity decision making for assessment and support, frequent evaluation and titration of therapies, application of advanced monitoring technologies and extensive interpretation of multiple databases. Critical Care Time devoted to patient care services described in this note  Time spent: 40 minutes     Lazer Wollard, Geraldo Docker, MD [facility] Pager [phone]  If 7PM-7AM, please contact night-coverage [email or web address] Password [email or web address] 09/05/2020, 7:25 AM

## 2020-09-05 NOTE — Progress Notes (Signed)
[facility] paged about no urine output during this shift. Bladder scan @ 0000 showed 120, bladder scan @ 0330 showed about 270. No new orders at this time. This RN will continue to monitor

## 2020-09-05 NOTE — Evaluation (Addendum)
Physical Therapy Evaluation Patient Details Name: April [name] MRN: [id number] DOB: 1956/01/09 Today's Date: 09/05/2020   History of Present Illness  Patient is 64 y.o. chronically ill 64 year old female with medical history of CHF, COPD on chronic 2 L, hypertension, reflux, polycystic disease s/p transplant.  Came in by EMS 10/13 , found to have low CBG given D10 with improvement in mentation.  Was seen 2 days ago and diagnosed with diverticulitis states that she has been vomiting and unable to keep her medications down  Clinical Impression  The patient  Is Eager to mobilize from Bed to recliner. Appears weaker than baseline.. Patient required min assist to stand and take small steps to recliner. Patient reports dizziness upon sitting and states  She has this frequently And relates it to vertigo. Patient lives with spouse who does work. Patient adamant about going home not rehab. Pt admitted with above diagnosis.* Pt currently with functional limitations due to the deficits listed below (see PT Problem List). Pt will benefit from skilled PT to increase their independence and safety with mobility to allow discharge to the venue listed below.       Follow Up Recommendations Home health PT;Supervision/Assistance - 24 hour    Equipment Recommendations  None recommended by PT    Recommendations for Other Services       Precautions / Restrictions Precautions Precautions: Fall Precaution Comments: reports frequent dizziness due to "vertigo"      Mobility  Bed Mobility Overal bed mobility: Needs Assistance Bed Mobility: Supine to Sit     Supine to sit: Min assist;HOB elevated     General bed mobility comments: use of bed rail, extra time, reports dizziness, sat on bed edge x 5 mins before progressing  Transfers Overall transfer level: Needs assistance Equipment used: Rolling walker (2 wheeled) Transfers: Sit to/from [facility] Sit to Stand: Min assist Stand  pivot transfers: Min assist       General transfer comment: steady assist to rise from bed, small steps to recliner  Ambulation/Gait             General Gait Details: nt  Stairs            Wheelchair Mobility    Modified Rankin (Stroke Patients Only)       Balance Overall balance assessment: Needs assistance;History of Falls Sitting-balance support: Bilateral upper extremity supported;Feet supported Sitting balance-Leahy Scale: Fair     Standing balance support: During functional activity;Bilateral upper extremity supported Standing balance-Leahy Scale: Poor Standing balance comment: reliant on RW                             Pertinent Vitals/Pain Pain Assessment: Faces Faces Pain Scale: Hurts little more Pain Location: abd Pain Descriptors / Indicators: Discomfort;Guarding;Grimacing Pain Intervention(s): Monitored during session    Home Living Family/patient expects to be discharged to:: Private residence Living Arrangements: Spouse/significant other;Parent, daughter Available Help at Discharge: Family;Available PRN/intermittently Type of Home: House Home Access: Ramped entrance     Home Layout: Two level;Able to live on main level with bedroom/bathroom Home Equipment: Gilford Rile - 2 wheels;Wheelchair - [employer];Shower seat;Adaptive equipment;Hospital bed Additional Comments: At home she lives with family, daughter is an OT, has lotsof equipment-taken from previous encounter    Prior Function Level of Independence: Needs assistance   Gait / Transfers Assistance Needed: sits  for shower on a tub bench in a shower stall., uses RW  ADL's / Homemaking  Assistance Needed: Able to do UB dressing, assist needed with LB dressing.        Hand Dominance        Extremity/Trunk Assessment   Upper Extremity Assessment Upper Extremity Assessment: Defer to OT evaluation    Lower Extremity Assessment Lower Extremity Assessment:  Generalized weakness;RLE deficits/detail;LLE deficits/detail RLE Deficits / Details: intermittent numbness for foot LLE Deficits / Details: same as right.    Cervical / Trunk Assessment Cervical / Trunk Assessment: Normal  Communication   Communication: No difficulties  Cognition Arousal/Alertness: Awake/alert Behavior During Therapy: WFL for tasks assessed/performed Overall Cognitive Status: Within Functional Limits for tasks assessed                                        General Comments      Exercises     Assessment/Plan    PT Assessment Patient needs continued PT services  PT Problem List Decreased strength;Decreased activity tolerance;Decreased balance;Decreased mobility;Decreased knowledge of precautions;Decreased safety awareness;Decreased knowledge of use of DME;Pain       PT Treatment Interventions DME instruction;Gait training;Functional mobility training;Therapeutic activities;Therapeutic exercise;Patient/family education    PT Goals (Current goals can be found in the Care Plan section)  Acute Rehab PT Goals Patient Stated Goal: to go home, absolutely not rehab PT Goal Formulation: With patient Time For Goal Achievement: 09/19/20 Potential to Achieve Goals: Good    Frequency Min 3X/week   Barriers to discharge Decreased caregiver support reports spouse works, has a friend wh sits w/ her,    Co-evaluation               AM-PAC PT "6 Clicks" Mobility  Outcome Measure Help needed turning from your back to your side while in a flat bed without using bedrails?: A Little Help needed moving from lying on your back to sitting on the side of a flat bed without using bedrails?: A Little Help needed moving to and from a bed to a chair (including a wheelchair)?: A Lot Help needed standing up from a chair using your arms (e.g., wheelchair or bedside chair)?: A Lot Help needed to walk in hospital room?: A Lot Help needed climbing 3-5 steps with a  railing? : Total 6 Click Score: 13    End of Session Equipment Utilized During Treatment: Oxygen Activity Tolerance: Patient limited by fatigue Patient left: in chair;with call bell/phone within reach Nurse Communication: Mobility status PT Visit Diagnosis: Unsteadiness on feet (R26.81);Difficulty in walking, not elsewhere classified (R26.2)    Time: 0092-3300 PT Time Calculation (min) (ACUTE ONLY): 44 min   Charges:   PT Evaluation $PT Eval Low Complexity: 1 Low PT Treatments $Therapeutic Activity: 23-37 mins        Tresa Endo PT Acute Rehabilitation Services Pager [phone] Office [phone]   Claretha Cooper 09/05/2020, 2:04 PM

## 2020-09-05 NOTE — TOC Initial Note (Signed)
Transition of Care [facility]) - Initial/Assessment Note    Patient Details  Name: ARABELL NERIA MRN: [id number] Date of Birth: 1956-09-29  Transition of Care [facility]) CM/SW Contact:    Leeroy Cha, RN Phone Number: 09/05/2020, 8:20 AM  Clinical Narrative:                 64 y.o. female with medical history significant of PCKD s/p renal transplant, COPD on 2-3 L, HFpEF, hypertension, GERD and multiple other medical problems who presents with abdominal pain and confusion.  History is obtained from patient, chart, and daughter (somewhat limited from pt as mildly lethargic, though improved as we spoke).  Her symptoms started about 1 week ago.  The pain lasted through the weekend, but on Monday she developed nausea and vomiting in addition to labored breathing.  She was seen at [facility] who recommended she go to [facility]  They diagnosed her with diverticulitis and sent her home with abx and pain meds.  After being home, she was unable to keep things down and continued to have nausea and vomiting.  Home health RN recommended she come to hospital today.  EMS noted glucometer read low.  She had confusion and lethargy.  + fevers to 101.5 on 10/12.  She notes some SOB.  Denies CP.  Notes continued abdominal pain.  Nausea and dry heaving.  Decreased appetite.  Dark urine.  ED Course: Labs, imaging. IVF, abx, IV steroids.  Imaging suggestive of diverticulitis.  Dextrose containing fluids for hypoglycemia.  Admit to hospitalists for sepsis 2/2 diverticulosis with adrenal insufficiency. o2 at 2l/min via [place], iv solu cortef, iv d10at 40cc/hrs, iv zosyn, Patient alert and reactive Plan to return to home with husband Following for progression.  Expected Discharge Plan: Home/Self Care Barriers to Discharge: Barriers Unresolved (comment)   Patient Goals and CMS Choice Patient states their goals for this hospitalization and ongoing recovery are:: to go home CMS Medicare.gov Compare Post Acute Care list  provided to:: Patient    Expected Discharge Plan and Services Expected Discharge Plan: Home/Self Care   Discharge Planning Services: CM Consult   Living arrangements for the past 2 months: Single Family Home                                      Prior Living Arrangements/Services Living arrangements for the past 2 months: Single Family Home Lives with:: Spouse Patient language and need for interpreter reviewed:: Yes Do you feel safe going back to the place where you live?: Yes      Need for Family Participation in Patient Care: Yes (Comment) Care giver support system in place?: Yes (comment)   Criminal Activity/Legal Involvement Pertinent to Current Situation/Hospitalization: No - Comment as needed  Activities of Daily Living Home Assistive Devices/Equipment: Eyeglasses, Gilford Rile (specify type) ADL Screening (condition at time of admission) Patient's cognitive ability adequate to safely complete daily activities?: Yes Is the patient deaf or have difficulty hearing?: No Does the patient have difficulty seeing, even when wearing glasses/contacts?: No Does the patient have difficulty concentrating, remembering, or making decisions?: No Patient able to express need for assistance with ADLs?: Yes Does the patient have difficulty dressing or bathing?: Yes Independently performs ADLs?: No Communication: Independent Dressing (OT): Needs assistance Is this a change from baseline?: Pre-admission baseline Grooming: Independent Feeding: Independent Bathing: Needs assistance Is this a change from baseline?: Pre-admission baseline Toileting: Needs assistance Is this  a change from baseline?: Pre-admission baseline In/Out Bed: Needs assistance Is this a change from baseline?: Pre-admission baseline Walks in Home: Needs assistance Is this a change from baseline?: Pre-admission baseline Does the patient have difficulty walking or climbing stairs?: Yes Weakness of Legs:  Both Weakness of Arms/Hands: Both  Permission Sought/Granted                  Emotional Assessment Appearance:: Appears stated age Attitude/Demeanor/Rapport: Engaged Affect (typically observed): Calm Orientation: : Oriented to Place, Oriented to Self, Oriented to  Time, Oriented to Situation Alcohol / Substance Use: Not Applicable Psych Involvement: No (comment)  Admission diagnosis:  Diverticulitis [K57.92] Hyponatremia [E87.1] Hypoglycemia [E16.2] Altered mental status, unspecified altered mental status type [R41.82] Sepsis without acute organ dysfunction, due to unspecified organism [facility]) [A41.9] Patient Active Problem List   Diagnosis Date Noted  . Diverticulitis 09/04/2020  . SIRS (systemic inflammatory response syndrome) ([place]) 07/02/2020  . Chronic kidney disease, stage 3a ([facility])   . 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 History of immunosuppressive therapy 05/27/2020  . FUO (fever of unknown origin) 05/15/2020  . Immunocompromised state ([place])   . Decubitus ulcer of coccyx, unstageable ([place]) 05/01/2020  . Pericardial effusion 04/16/2020  . Chronic respiratory failure with hypoxia ([place]) 04/16/2020  . Proteinuria 04/16/2020  . Pressure injury of skin 04/08/2020  . COPD (chronic obstructive pulmonary disease) ([place]) 04/07/2020  . SVT (supraventricular tachycardia) ([place])   . Anemia of chronic disease   . Palliative care encounter   . Goals of care, counseling/discussion   . Essential hypertension 02/08/2020  . Glaucoma 02/08/2020  . Sepsis without acute organ dysfunction ([place]) 01/31/2020  . Fracture of left superior pubic ramus ([facility])   . Pain   . Anxiety state   . Left displaced femoral neck fracture ([place]) 12/15/2019  . Status post total hip replacement, left   . Post-op pain   . Polycystic kidney   . Closed left hip fracture ([place]) 12/07/2019  . Left wrist fracture 12/07/2019  . Medication management 09/22/2019  . Physical deconditioning 09/22/2019  .  Acute respiratory failure with hypoxia ([place]) 04/19/2019  . Acute on chronic diastolic CHF (congestive heart failure) ([place]) 04/19/2019  . Bilateral closed proximal tibial fracture 12/21/2018  . Asthma, chronic, unspecified asthma severity, with acute exacerbation 10/03/2018  . Chronic diastolic CHF (congestive heart failure) ([place]) 10/01/2017  . Asthma, mild intermittent 07/15/2016  . GERD without esophagitis 07/15/2016  . Renal transplant recipient 07/15/2016  . Hyperlipidemia 07/15/2016  . Depression 07/15/2016  . Bronchitis, mucopurulent recurrent ([place]) 08/08/2014  . Chronic cough 07/24/2014  . End stage renal disease ([place]) 03/23/2012  . 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, implant, and graft 03/23/2012   PCP:  [name], MD Pharmacy:   [facility], [address] [place] [address] Phone: [phone] Fax: [phone]     Social Determinants of Health (SDOH) Interventions    Readmission Risk Interventions Readmission Risk Prevention Plan 05/15/2020 04/19/2020 02/09/2020  Transportation Screening Complete Complete Complete  PCP or Specialist Appt within 3-5 Days - - -  Not Complete comments - - -  HRI or [place] or Home Care Consult comments - - -  Social Work Consult for [place] Planning/Counseling - - -  SW consult not completed comments - - -  Palliative Care Screening - - -  Medication Review Press photographer) Referral  to Pharmacy Complete Referral to Pharmacy  PCP or Specialist appointment within 3-5 days of discharge Complete Complete Complete  HRI or Home Care Consult Complete Complete Complete  SW Recovery Care/Counseling Consult Complete Complete -  Palliative Care Screening Not Applicable Not Applicable Complete  Skilled Nursing Facility Complete Complete Patient Refused  Some recent data might be hidden

## 2020-09-06 DIAGNOSIS — O85 Puerperal sepsis: Secondary | ICD-10-CM

## 2020-09-06 LAB — CBC WITH DIFFERENTIAL/PLATELET
Abs Immature Granulocytes: 0.04 10*3/uL (ref 0.00–0.07)
Basophils Absolute: 0 10*3/uL (ref 0.0–0.1)
Basophils Relative: 0 %
Eosinophils Absolute: 0 10*3/uL (ref 0.0–0.5)
Eosinophils Relative: 0 %
HCT: 28.4 % — ABNORMAL LOW (ref 36.0–46.0)
Hemoglobin: 8.8 g/dL — ABNORMAL LOW (ref 12.0–15.0)
Immature Granulocytes: 1 %
Lymphocytes Relative: 8 %
Lymphs Abs: 0.6 10*3/uL — ABNORMAL LOW (ref 0.7–4.0)
MCH: 31.2 pg (ref 26.0–34.0)
MCHC: 31 g/dL (ref 30.0–36.0)
MCV: 100.7 fL — ABNORMAL HIGH (ref 80.0–100.0)
Monocytes Absolute: 0.2 10*3/uL (ref 0.1–1.0)
Monocytes Relative: 3 %
Neutro Abs: 6.2 10*3/uL (ref 1.7–7.7)
Neutrophils Relative %: 88 %
Platelets: 153 10*3/uL (ref 150–400)
RBC: 2.82 MIL/uL — ABNORMAL LOW (ref 3.87–5.11)
RDW: 19.3 % — ABNORMAL HIGH (ref 11.5–15.5)
WBC: 7.1 10*3/uL (ref 4.0–10.5)
nRBC: 0 % (ref 0.0–0.2)

## 2020-09-06 LAB — MAGNESIUM: Magnesium: 2 mg/dL (ref 1.7–2.4)

## 2020-09-06 LAB — COMPREHENSIVE METABOLIC PANEL
ALT: 8 U/L (ref 0–44)
AST: 13 U/L — ABNORMAL LOW (ref 15–41)
Albumin: 2.6 g/dL — ABNORMAL LOW (ref 3.5–5.0)
Alkaline Phosphatase: 45 U/L (ref 38–126)
Anion gap: 11 (ref 5–15)
BUN: 52 mg/dL — ABNORMAL HIGH (ref 8–23)
CO2: 21 mmol/L — ABNORMAL LOW (ref 22–32)
Calcium: 8.7 mg/dL — ABNORMAL LOW (ref 8.9–10.3)
Chloride: 99 mmol/L (ref 98–111)
Creatinine, Ser: 1.84 mg/dL — ABNORMAL HIGH (ref 0.44–1.00)
GFR, Estimated: 29 mL/min — ABNORMAL LOW (ref >60)
Glucose, Bld: 136 mg/dL — ABNORMAL HIGH (ref 70–99)
Potassium: 4.1 mmol/L (ref 3.5–5.1)
Sodium: 131 mmol/L — ABNORMAL LOW (ref 135–145)
Total Bilirubin: 0.9 mg/dL (ref 0.3–1.2)
Total Protein: 6.7 g/dL (ref 6.5–8.1)

## 2020-09-06 LAB — GLUCOSE, CAPILLARY
Glucose-Capillary: 124 mg/dL — ABNORMAL HIGH (ref 70–99)
Glucose-Capillary: 126 mg/dL — ABNORMAL HIGH (ref 70–99)
Glucose-Capillary: 136 mg/dL — ABNORMAL HIGH (ref 70–99)
Glucose-Capillary: 107 mg/dL — ABNORMAL HIGH (ref 70–99)
Glucose-Capillary: 91 mg/dL (ref 70–99)

## 2020-09-06 LAB — PROCALCITONIN: Procalcitonin: 1.68 ng/mL

## 2020-09-06 LAB — PHOSPHORUS: Phosphorus: 5.3 mg/dL — ABNORMAL HIGH (ref 2.5–4.6)

## 2020-09-06 MED ORDER — FERROUS SULFATE 325 (65 FE) MG PO TABS
325.0000 mg | ORAL_TABLET | Freq: Every day | ORAL | Status: DC
  Administered 2020-09-07 – 2020-09-12 (×6): 325 mg via ORAL
  Filled 2020-09-06 (×6): qty 1

## 2020-09-06 MED ORDER — ALBUMIN HUMAN 25 % IV SOLN
25.0000 g | Freq: Once | INTRAVENOUS | Status: AC
  Administered 2020-09-06: 25 g via INTRAVENOUS
  Filled 2020-09-06: qty 100

## 2020-09-06 MED ORDER — ENOXAPARIN SODIUM 30 MG/0.3ML ~~LOC~~ SOLN
30.0000 mg | SUBCUTANEOUS | Status: DC
  Administered 2020-09-06 – 2020-09-08 (×3): 30 mg via SUBCUTANEOUS
  Filled 2020-09-06 (×3): qty 0.3

## 2020-09-06 NOTE — Care Management Important Message (Signed)
Important Message  Patient Details IM Letter given to the Patient Name: April [name] MRN: [id number] Date of Birth: 21-Apr-1956   Medicare Important Message Given:  Yes     Kerin Salen 09/06/2020, 11:27 AM

## 2020-09-06 NOTE — Progress Notes (Signed)
Patient taking home resp med.

## 2020-09-06 NOTE — Progress Notes (Signed)
PROGRESS NOTE    April [name]  BSJ:[id number] DOB: Nov 14, 1956 DOA: 09/04/2020 PCP: [name], MD   Brief Narrative:   64 y.o. 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  History is obtained from patient, chart, and daughter (somewhat limited from pt as mildly lethargic, though improved as we spoke).  Her symptoms started about 1 week ago.  The pain lasted through the weekend, but on Monday she developed nausea and vomiting in addition to labored breathing.  She was seen at [facility] who recommended she go to [facility].  They diagnosed her with diverticulitis and sent her home with abx and pain meds.  After being home, she was unable to keep things down and continued to have nausea and vomiting.  Home health RN recommended she come to hospital today.  EMS noted glucometer read low.  She had confusion and lethargy.  + fevers to 101.5 on 10/12.  She notes some SOB.  Denies CP.  Notes continued abdominal pain.  Nausea and dry heaving.  Decreased appetite.  Dark urine.  ED Course: Labs, imaging. IVF, abx, IV steroids.  Imaging suggestive of diverticulitis.  Dextrose containing fluids for hypoglycemia.  Admit to hospitalists for sepsis 2/2 diverticulosis with adrenal insufficiency.  Review of Systems: As per HPI otherwise all other systems reviewed and are negative.   Subjective: 10/15 afebrile overnight A/O x4, negative CP, negative nausea, negative vomiting.  Mild abdominal pain.  Postprandial with medication   Assessment & Plan:  Covid vaccination; vaccinated  Active Problems:   Sepsis without acute organ dysfunction (April [name])   Diverticulitis  Sepsis -On admission patient met criteria for sepsis RR> 20, HR> 90, infected system intestines (diverticulitis)  Diverticulitis  -Patient immunocompromised secondary to  kidney transplant and being on chronic steroids, and immunosuppressants  -Patient received dose of ceftriaxone+ Flagyl in ED -Change to Zosyn.  Given her immunocompromise state would treat for minimal 7 days. Results for April [name], April [name] (MRN [id number]) as of 09/05/2020 07:34  Ref. Range 09/04/2020 14:17 09/04/2020 16:20  Lactic Acid, Venous Latest Ref Range: 0.5 - 1.9 mmol/L 1.0 0.9  -10/14 patient diagnosed with sepsis we will follow procalcitonin to determine antibiotic usage. Results for April [name], April [name] (MRN [id number]) as of 09/06/2020 08:19  Ref. Range 09/05/2020 02:50 09/06/2020 04:19  Procalcitonin Latest Units: ng/mL 1.58 1.70   Acute metabolic encephalopathy -Secondary to sepsis -Resolved   Hypoglycemia  Adrenal Insufficiency: -2/2 acute illness on chronic steroids with prednisone 7.5 mg daily for hx renal transplant.  EMS noted CBG reading low.   -Start stress dose steroids; see hypotension -Q4H blood sugar check  Chronic respiratory failure with hypoxia/tachypnea -On 3 L O2 at home. -Once patient's diverticulitis resolved will obtain ambulatory SPO2  Acute Metabolic Encephalopathy:  -Likely 2/2 sepsis and hypoglycemia.  She's improving, but still lethargic.   -Normal ammonia, pCO2 -TSH WNL, -Vitamin B12 elevated  -Folate WNL -Resolved  Hx Polycystic Kidney Disease s/p renal transplant  CKD IIIb: (Baseline Cr ~1-1.2) Lab Results  Component Value Date   CREATININE 1.84 (H) 09/06/2020   CREATININE 1.24 (H) 09/05/2020   CREATININE 1.31 (H) 09/04/2020   CREATININE 1.20 (H) 09/02/2020   CREATININE 1.30 (H) 08/05/2020  -Basically baseline -Mycophenolate 180 mg BID -Tacrolimus 2 mg daily/1 mg qhs -Valacyclovir 500 mg BID -10/15 CellCept and Tacrolimus level pending  Abnormal UA:  -Hematuria -Urine culture pending  Chronic diastolic CHF -: echo 02/980 with EF 55-60%, grade II diastolic dysfunction, mildly elevated PASP.   -Hold lasix for now with  sepsis.  Follow BNP. -Strict in and out +1.1 L -Daily weight  Filed Weights   09/05/20 0350 09/05/20 2034 09/06/20 0507  Weight: 69.7 kg 69.5 kg 69.5 kg  -10/14 normal saline 75 ml/hr  Hypotension -10/15 Albumin 25 g -Normal saline 52m/hr -Solu-Cortef 50 mg QID (stress dose steroids); begin to taper on 10/15; addendum hold on taper  COPD  Chronic Hypoxic Resp Failure: -Continue home meds  Smoldering multiple myeloma Follows with Dr. DLorenso Courieroutpatient Following q3 months outpatient   Gout: -Continue allopurinol  Hypertension: -Hold imdur, lasix in setting of sepsis.   -Continue verapamil for now.  GERD: PPI  Umbilical Hernia  Left Inguinal Hernia: -Fat containing umbilical hernia as well as L inguinal hernia with 2.5 cm cystic focus    DVT prophylaxis: Lovenox Code Status: Full Family Communication:  Status is: Inpatient    Dispo: The patient is from: Home              Anticipated April/c is to: Home              Anticipated April/c date is: 10/21              Patient currently unstable      Consultants:    Procedures/Significant Events:     I have personally reviewed and interpreted all radiology studies and my findings are as above.  VENTILATOR SETTINGS: Nasal cannula 10/15 Flow; 2 L/min SPO2 100%   Cultures 10/13 blood RIGHT AC NGTD 13 blood RIGHT hand NGTD 10/13 urine pending 10/13 MRSA by PCR negative   Antimicrobials: Anti-infectives (From admission, onward)   Start     Ordered Stop   09/04/20 2200  valACYclovir (VALTREX) tablet 500 mg        09/04/20 1934     09/04/20 2200  piperacillin-tazobactam (ZOSYN) IVPB 3.375 g        09/04/20 1837     09/04/20 1330  cefTRIAXone (ROCEPHIN) 2 g in sodium chloride 0.9 % 100 mL IVPB        09/04/20 1329 09/04/20 1445   09/04/20 1330  metroNIDAZOLE (FLAGYL) IVPB 500 mg        09/04/20 1329 09/04/20 1522       Devices    LINES / TUBES:      Continuous Infusions: .  piperacillin-tazobactam (ZOSYN)  IV 3.375 g (09/06/20 0603)     Objective: Vitals:   09/05/20 1937 09/05/20 2034 09/05/20 2348 09/06/20 0507  BP:  (!) 111/57 (!) 110/51 (!) 122/55  Pulse:  77 60 63  Resp:  20 16 18   Temp: (!) 97.1 F (36.2 C) 97.6 F (36.4 C) 97.6 F (36.4 C) 97.7 F (36.5 C)  TempSrc: Axillary Oral  Oral  SpO2:  100% 100% 100%  Weight:  69.5 kg  69.5 kg  Height:  5' 1"  (1.549 m)      Intake/Output Summary (Last 24 hours) at 09/06/2020 0816 Last data filed at 09/05/2020 1500 Gross per 24 hour  Intake 808.08 ml  Output --  Net 808.08 ml   Filed Weights   09/05/20 0350 09/05/20 2034 09/06/20 0507  Weight: 69.7 kg 69.5 kg 69.5 kg   Physical Exam:  General: A/O x4, No acute respiratory distress Eyes: negative scleral hemorrhage, negative anisocoria, negative icterus ENT: Negative Runny nose, negative gingival bleeding, Neck:  Negative scars, masses, torticollis, lymphadenopathy,  JVD Lungs: Clear to auscultation bilaterally without wheezes or crackles Cardiovascular: Regular rate and rhythm without murmur gallop or rub normal S1 and S2 Abdomen: Positive abdominal pain palpation but significantly improved, nondistended, positive soft, bowel sounds, no rebound, no ascites, no appreciable mass Extremities: No significant cyanosis, clubbing, or edema bilateral lower extremities Skin: Negative rashes, lesions, ulcers Psychiatric:  Negative depression, negative anxiety, negative fatigue, negative mania  Central nervous system:  Cranial nerves II through XII intact, tongue/uvula midline, all extremities muscle strength 5/5, sensation intact throughout, negative dysarthria, negative expressive aphasia, negative receptive aphasia.  .     Data Reviewed: Care during the described time interval was provided by me .  I have reviewed this patient's available data, including medical history, events of note, physical examination, and all test results as part of my  evaluation.   CBC: Recent Labs  Lab 09/02/20 2049 09/04/20 1417 09/05/20 0250 09/06/20 0419  WBC 8.0 18.3* 11.2* 7.1  NEUTROABS  --  16.1*  --  6.2  HGB 10.6* 11.3* 10.0* 8.8*  HCT 34.7* 36.8 32.7* 28.4*  MCV 102.4* 101.4* 101.2* 100.7*  PLT 207 190 167 237   Basic Metabolic Panel: Recent Labs  Lab 09/02/20 2049 09/04/20 1417 09/04/20 1418 09/05/20 0250 09/06/20 0419  NA 134* 128*  --  134* 131*  K 4.1 3.7  --  4.3 4.1  CL 101 96*  --  100 99  CO2 22 21*  --  21* 21*  GLUCOSE 118* 112*  --  164* 136*  BUN 29* 30*  --  33* 52*  CREATININE 1.20* 1.31*  --  1.24* 1.84*  CALCIUM 9.8 9.6  --  9.4 8.7*  MG  --   --  1.7  --  2.0  PHOS  --   --   --   --  5.3*   GFR: Estimated Creatinine Clearance: 27.9 mL/min (A) (by C-G formula based on SCr of 1.84 mg/dL (H)). Liver Function Tests: Recent Labs  Lab 09/02/20 2049 09/04/20 1417 09/05/20 0250 09/06/20 0419  AST 19 21 18  13*  ALT 10 10 8 8   ALKPHOS 67 74 59 45  BILITOT 2.1* 2.6* 1.6* 0.9  PROT 8.4* 8.9* 7.7 6.7  ALBUMIN 3.4* 3.4* 2.8* 2.6*   Recent Labs  Lab 09/02/20 2049  LIPASE 34   Recent Labs  Lab 09/04/20 1418  AMMONIA 18   Coagulation Profile: Recent Labs  Lab 09/04/20 1417  INR 1.5*   Cardiac Enzymes: No results for input(s): CKTOTAL, CKMB, CKMBINDEX, TROPONINI in the last 168 hours. BNP (last 3 results) No results for input(s): PROBNP in the last 8760 hours. HbA1C: No results for input(s): HGBA1C in the last 72 hours. CBG: Recent Labs  Lab 09/05/20 1148 09/05/20 1636 09/05/20 1921 09/05/20 2350 09/06/20 0502  GLUCAP 144* 153* 149* 119* 124*   Lipid Profile: No results for input(s): CHOL, HDL, LDLCALC, TRIG, CHOLHDL, LDLDIRECT in the last 72 hours. Thyroid Function Tests: Recent Labs    09/04/20 1417  TSH 1.544   Anemia Panel: Recent Labs    09/04/20 1417  VITAMINB12 2,749*  FOLATE 16.2   Urine analysis:    Component Value Date/Time   COLORURINE AMBER (A) 09/04/2020  1527   APPEARANCEUR CLOUDY (A) 09/04/2020 1527   LABSPEC 1.017 09/04/2020 1527   PHURINE 5.0 09/04/2020 1527   GLUCOSEU NEGATIVE 09/04/2020 1527   HGBUR SMALL (A) 09/04/2020 1527   BILIRUBINUR NEGATIVE 09/04/2020 1527   KETONESUR 5 (A) 09/04/2020 1527  PROTEINUR >=300 (A) 09/04/2020 1527   UROBILINOGEN 0.2 05/15/2013 0733   NITRITE NEGATIVE 09/04/2020 1527   LEUKOCYTESUR NEGATIVE 09/04/2020 1527   Sepsis Labs: @LABRCNTIP (procalcitonin:4,lacticidven:4)  ) Recent Results (from the past 240 hour(s))  Respiratory Panel by RT PCR (Flu A&B, Covid) - Nasopharyngeal Swab     Status: None   Collection Time: 09/04/20  2:14 PM   Specimen: Nasopharyngeal Swab  Result Value Ref Range Status   SARS Coronavirus 2 by RT PCR NEGATIVE NEGATIVE Final    Comment: (NOTE) SARS-CoV-2 target nucleic acids are NOT DETECTED.  The SARS-CoV-2 RNA is generally detectable in upper respiratoy specimens during the acute phase of infection. The lowest concentration of SARS-CoV-2 viral copies this assay can detect is 131 copies/mL. A negative result does not preclude SARS-Cov-2 infection and should not be used as the sole basis for treatment or other patient management decisions. A negative result may occur with  improper specimen collection/handling, submission of specimen other than nasopharyngeal swab, presence of viral mutation(s) within the areas targeted by this assay, and inadequate number of viral copies (<131 copies/mL). A negative result must be combined with clinical observations, patient history, and epidemiological information. The expected result is Negative.  Fact Sheet for Patients:  PinkCheek.be  Fact Sheet for Healthcare Providers:  GravelBags.it  This test is no t yet approved or cleared by the [place] FDA and  has been authorized for detection and/or diagnosis of SARS-CoV-2 by FDA under an Emergency Use Authorization  (EUA). This EUA will remain  in effect (meaning this test can be used) for the duration of the COVID-19 declaration under Section 564(b)(1) of the Act, 21 U.S.C. section 360bbb-3(b)(1), unless the authorization is terminated or revoked sooner.     Influenza A by PCR NEGATIVE NEGATIVE Final   Influenza B by PCR NEGATIVE NEGATIVE Final    Comment: (NOTE) The Xpert Xpress SARS-CoV-2/FLU/RSV assay is intended as an aid in  the diagnosis of influenza from Nasopharyngeal swab specimens and  should not be used as a sole basis for treatment. Nasal washings and  aspirates are unacceptable for Xpert Xpress SARS-CoV-2/FLU/RSV  testing.  Fact Sheet for Patients: PinkCheek.be  Fact Sheet for Healthcare Providers: GravelBags.it  This test is not yet approved or cleared by the [place] FDA and  has been authorized for detection and/or diagnosis of SARS-CoV-2 by  FDA under an Emergency Use Authorization (EUA). This EUA will remain  in effect (meaning this test can be used) for the duration of the  Covid-19 declaration under Section 564(b)(1) of the Act, 21  U.S.C. section 360bbb-3(b)(1), unless the authorization is  terminated or revoked. Performed at [facility], [place] [address], [place], [place] [address]   Blood Culture (routine x 2)     Status: None (Preliminary result)   Collection Time: 09/04/20  2:18 PM   Specimen: BLOOD  Result Value Ref Range Status   Specimen Description   Final    BLOOD RIGHT ANTECUBITAL Performed at [facility] [address], [place], [place] [address]    Special Requests   Final    BOTTLES DRAWN AEROBIC AND ANAEROBIC Blood Culture adequate volume Performed at [facility] [address], [place], [place] [address]    Culture   Final    NO GROWTH 2 DAYS Performed at [facility] [address] [place], [place] [address]     Report Status PENDING  Incomplete  Urine culture  Status: None   Collection Time: 09/04/20  3:27 PM   Specimen: In/Out Cath Urine  Result Value Ref Range Status   Specimen Description   Final    IN/OUT CATH URINE Performed at [facility], [place] [address] [place], [place] [address]    Special Requests   Final    NONE Performed at [facility], [place] [address] [place], [place] [address]    Culture   Final    NO GROWTH Performed at [facility], [place] [address] [place], [place] [address]    Report Status 09/05/2020 FINAL  Final  MRSA PCR Screening     Status: None   Collection Time: 09/04/20  6:11 PM   Specimen: Nasal Mucosa; Nasopharyngeal  Result Value Ref Range Status   MRSA by PCR NEGATIVE NEGATIVE Final    Comment:        The GeneXpert MRSA Assay (FDA approved for NASAL specimens only), is one component of a comprehensive MRSA colonization surveillance program. It is not intended to diagnose MRSA infection nor to guide or monitor treatment for MRSA infections. Performed at [facility], [place] [address], [place], [place] [address]   Blood Culture (routine x 2)     Status: None (Preliminary result)   Collection Time: 09/04/20  7:12 PM   Specimen: BLOOD RIGHT HAND  Result Value Ref Range Status   Specimen Description   Final    BLOOD RIGHT HAND Performed at [facility] [address], [place], [place] [address]    Special Requests   Final    BOTTLES DRAWN AEROBIC AND ANAEROBIC Blood Culture adequate volume Performed at [facility] [address] [place], [place] [address]    Culture   Final    NO GROWTH 2 DAYS Performed at [facility] [address], [place],  [address]    Report Status PENDING  Incomplete         Radiology Studies: CT ABDOMEN PELVIS WO CONTRAST  Result Date: 09/04/2020 CLINICAL DATA:  Left lower quadrant abdominal  pain.  Dyspnea. EXAM: CT ABDOMEN AND PELVIS WITHOUT CONTRAST TECHNIQUE: Multidetector CT imaging of the abdomen and pelvis was performed following the standard protocol without IV contrast. COMPARISON:  09/02/2020 CT abdomen/pelvis. FINDINGS: Limited motion degraded scan. Lower chest: Trace dependent bilateral pleural effusions with mild-to-moderate bibasilar atelectasis, increased. Cardiomegaly. Trace pericardial effusion/thickening, unchanged. Hepatobiliary: Normal liver size. Numerous simple cysts scattered throughout the liver, largest 3.4 cm in the left liver lobe. Numerous subcentimeter hypodense lesions scattered throughout the liver are too small to characterize and are not appreciably changed. No appreciable new liver lesions. Normal gallbladder with no radiopaque cholelithiasis. No biliary ductal dilatation. Pancreas: Normal, with no mass or duct dilation. Spleen: Mild splenomegaly. Craniocaudal splenic length 13.6 cm, stable. No splenic mass. Adrenals/Urinary Tract: Normal adrenals. Prominently enlarged polycystic kidneys bilaterally. No hydronephrosis. No renal stones. Several of the renal cysts demonstrate mural calcifications in the kidneys bilaterally, unchanged. Dominant exophytic 7.1 cm lateral interpolar right renal cyst and dominant 7.6 cm lower left renal cyst. No overtly suspicious renal masses on this noncontrast scan. Right lower quadrant transplant kidney demonstrates no hydronephrosis, no renal stones, no contour deforming renal masses and no perinephric collections. Bladder is nondistended and is obscured by streak artifact from left hip hardware with no gross bladder abnormality. Stomach/Bowel: Normal non-distended stomach. Normal caliber small bowel with no small bowel wall thickening. Normal appendix. Moderate sigmoid diverticulosis. Mild  wall thickening with associated mild pericolonic fat stranding in the mid sigmoid colon compatible with acute sigmoid diverticulitis, slightly improved  compared to the CT study performed 2 days prior. No pericolonic free air or abscess. Vascular/Lymphatic: Atherosclerotic nonaneurysmal abdominal aorta. No pathologically enlarged lymph nodes in the abdomen or pelvis. Reproductive: Grossly normal uterus.  No adnexal mass. Other: No pneumoperitoneum. Small simple 2.5 x 1.8 cm cystic focus associated with an apparent small left inguinal hernia, unchanged (series 3/image 87). Small to moderate fat containing right periumbilical hernia is unchanged. Musculoskeletal: No aggressive appearing focal osseous lesions. Left total hip arthroplasty. Healed deformities in the left pubic rami. Mild thoracolumbar spondylosis. IMPRESSION: 1. Acute sigmoid diverticulitis, slightly improved compared to the CT study performed 2 days prior. No free air or abscess. 2. Trace dependent bilateral pleural effusions with mild-to-moderate bibasilar atelectasis, increased. 3. Cardiomegaly. Trace pericardial effusion/thickening, unchanged. 4. Prominently enlarged polycystic kidneys bilaterally, unchanged. No hydronephrosis. Right lower quadrant transplant kidney is unremarkable. 5. Stable mild splenomegaly. 6. Stable small simple 2.5 cm cystic focus associated with an apparent small left inguinal hernia. 7. Stable small to moderate fat containing right periumbilical hernia. 8. Aortic Atherosclerosis (ICD10-I70.0). Electronically Signed   By: Ilona Sorrel M.April.   On: 09/04/2020 16:27   CT Head Wo Contrast  Result Date: 09/04/2020 CLINICAL DATA:  Delirium. EXAM: CT HEAD WITHOUT CONTRAST TECHNIQUE: Contiguous axial images were obtained from the base of the skull through the vertex without intravenous contrast. COMPARISON:  Head CT 04/15/2020. FINDINGS: Brain: There is no acute intracranial hemorrhage. No demarcated cortical infarct. No extra-axial fluid collection. No evidence of intracranial mass. No midline shift. Vascular: No hyperdense vessel. Skull: Normal. Negative for fracture or focal  lesion. Sinuses/Orbits: Visualized orbits show no acute finding. No significant paranasal sinus disease or mastoid effusion at the imaged levels. IMPRESSION: No evidence of acute intracranial abnormality. Electronically Signed   By: Kellie Simmering DO   On: 09/04/2020 16:22   DG CHEST PORT 1 VIEW  Result Date: 09/05/2020 CLINICAL DATA:  Dyspnea, COPD, CHF EXAM: PORTABLE CHEST 1 VIEW COMPARISON:  Chest radiograph from one day prior. FINDINGS: Stable cardiomediastinal silhouette with mild cardiomegaly. No pneumothorax. No pleural effusion. No overt pulmonary edema. Mild bibasilar scarring versus atelectasis, similar. IMPRESSION: 1. Stable mild cardiomegaly without overt pulmonary edema. 2. Stable mild bibasilar scarring versus atelectasis. Electronically Signed   By: Ilona Sorrel M.April.   On: 09/05/2020 08:40   DG Chest Port 1 View  Result Date: 09/04/2020 CLINICAL DATA:  Questionable sepsis.  Evaluate for abnormality. EXAM: PORTABLE CHEST 1 VIEW COMPARISON:  Multiple priors, most recent 07/18/2020 FINDINGS: Cardiac silhouette is similar to priors and accentuated by low lung volumes and portable technique. Low lung volumes with streaky bibasilar opacities. No visible pleural effusions or pneumothorax. No acute osseous abnormality. IMPRESSION: Low lung volumes with streaky bibasilar opacities, favor atelectasis. Electronically Signed   By: Margaretha Sheffield MD   On: 09/04/2020 14:57        Scheduled Meds: . albuterol  2 puff Inhalation Once  . allopurinol  300 mg Oral Daily  . ascorbic acid  500 mg Oral Daily  . aspirin  81 mg Oral Daily  . brimonidine  1 drop Both Eyes BID  . Budeson-Glycopyrrol-Formoterol  2 puff Inhalation BID  . Chlorhexidine Gluconate Cloth  6 each Topical Daily  . cholecalciferol  1,000 Units Oral Daily  . cycloSPORINE  1 drop Both Eyes BID  . dorzolamide  1 drop Both Eyes Daily  .  enoxaparin (LOVENOX) injection  40 mg Subcutaneous Q24H  . gabapentin  200 mg Oral TID  .  hydrocortisone sod succinate (SOLU-CORTEF) inj  50 mg Intravenous Q6H  . latanoprost  1 drop Both Eyes QHS  . mouth rinse  15 mL Mouth Rinse BID  . mycophenolate  180 mg Oral BID  . pantoprazole  40 mg Oral BID  . tacrolimus  2 mg Oral Daily   And  . tacrolimus  1 mg Oral QHS  . valACYclovir  500 mg Oral BID  . verapamil  240 mg Oral Daily  . cyanocobalamin  1,000 mcg Oral Daily   Continuous Infusions: . piperacillin-tazobactam (ZOSYN)  IV 3.375 g (09/06/20 0603)     LOS: 2 days   The patient is critically ill with multiple organ systems failure and requires high complexity decision making for assessment and support, frequent evaluation and titration of therapies, application of advanced monitoring technologies and extensive interpretation of multiple databases. Critical Care Time devoted to patient care services described in this note  Time spent: 40 minutes     Carla Whilden, Geraldo Docker, MD [facility] Pager [phone]  If 7PM-7AM, please contact night-coverage [email or web address] Password [email or web address] 09/06/2020, 8:16 AM

## 2020-09-06 NOTE — Evaluation (Signed)
Occupational Therapy Evaluation Patient Details Name: ASMARA BACKS MRN: [id number] DOB: 1956/08/24 Today's Date: 09/06/2020    History of Present Illness Patient is 64 y.o. chronically ill 64 year old female with medical history of CHF, COPD on chronic 2 L, hypertension, reflux, polycystic disease s/p transplant.  Came in by EMS 10/13 , found to have low CBG given D10 with improvement in mentation.  Was seen 2 days ago and diagnosed with diverticulitis states that she has been vomiting and unable to keep her medications down   Clinical Impression   Patient is currently requiring assistance with ADLs including min guard to minimal assist with LE dressing and bathing, and toileting, and setup with seated feeding and grooming due to BUE tremor, many of which is below patient's typical baseline of being Modified independent.  During this evaluation, patient was limited by generalized weakness, dizziness vs vertigo with positional changes, UE tremor, and h/o falls, which has the potential to impact patient's safety and independence during functional mobility, as well as performance for ADLs. Robertson "6-clicks" Daily Activity Inpatient Short Form score of 18/24 indicates 46.65%  ADL impairment this session. Patient lives with Mother, husband, and grandson.  Mother is able to provide 24/7 supervision only.  Family members all assist PRN and perform all IADLS.   Patient demonstrates good rehab potential, and should benefit from continued skilled occupational therapy services while in acute care to maximize safety, independence and quality of life at home.  Continued occupational therapy services in the home is recommended.  ?     Follow Up Recommendations  Home health OT;Supervision/Assistance - 24 hour    Equipment Recommendations  None recommended by OT    Recommendations for Other Services       Precautions / Restrictions Precautions Precautions: Fall Precaution Comments:  reports frequent dizziness due to "vertigo". Has not had opportunity for BPPV testing, but is interested. Restrictions Weight Bearing Restrictions: No      Mobility Bed Mobility Overal bed mobility: Needs Assistance Bed Mobility: Supine to Sit     Supine to sit: Min guard;HOB elevated     General bed mobility comments: use of bed rail, extra time, reports dizziness, sat on bed edge x 5-6 mins before progressing  Transfers Overall transfer level: Needs assistance Equipment used: Rolling walker (2 wheeled) Transfers: Sit to/from [facility] Sit to Stand: Min assist Stand pivot transfers: Min assist       General transfer comment: steady assist to rise from bed, small steps to recliner, good eccentric control with stand to sit req Min guard.    Balance Overall balance assessment: Needs assistance;History of Falls Sitting-balance support: Bilateral upper extremity supported;Feet supported Sitting balance-Leahy Scale: Fair     Standing balance support: During functional activity;Bilateral upper extremity supported Standing balance-Leahy Scale: Poor Standing balance comment: reliant on RW                           ADL either performed or assessed with clinical judgement   ADL Overall ADL's : Needs assistance/impaired Eating/Feeding: Set up;Sitting Eating/Feeding Details (indicate cue type and reason): setup due to tremor Grooming: Sitting;Set up   Upper Body Bathing: Min guard;Sitting   Lower Body Bathing: Minimal assistance;Sitting/lateral leans;Sit to/from stand   Upper Body Dressing : Sitting;Min guard;Set up   Lower Body Dressing: Minimal assistance;Sitting/lateral leans;Sit to/from stand   Toilet Transfer: Minimal assistance;RW;BSC   Toileting- Clothing Manipulation and Hygiene: Sitting/lateral lean;Minimal assistance;Sit to/from stand  Toileting - Clothing Manipulation Details (indicate cue type and reason): One seated LOB with foot  slipped on floor and Min guard for safety. Min Assist for thoroughness with peri hygiene.     Functional mobility during ADLs: Minimal assistance;Min guard;Rolling walker       Vision Baseline Vision/History: Wears glasses Wears Glasses: Reading only (Reprots needs bifocals but lost) Patient Visual Report: No change from baseline       Perception     Praxis      Pertinent Vitals/Pain Pain Assessment: 0-10 Pain Score: 2  Pain Location: "Rumble" in stomach Pain Intervention(s): Monitored during session;Repositioned     Hand Dominance Right   Extremity/Trunk Assessment Upper Extremity Assessment Upper Extremity Assessment: Generalized weakness (BUE tremor)   Lower Extremity Assessment Lower Extremity Assessment: Defer to PT evaluation RLE Deficits / Details: intermittent numbness for foot LLE Deficits / Details: same as right.   Cervical / Trunk Assessment Cervical / Trunk Assessment: Normal   Communication Communication Communication: No difficulties   Cognition Arousal/Alertness: Awake/alert Behavior During Therapy: WFL for tasks assessed/performed Overall Cognitive Status: Within Functional Limits for tasks assessed                                     General Comments       Exercises     Shoulder Instructions      Home Living Family/patient expects to be discharged to:: Private residence Living Arrangements: Spouse/significant other;Parent Available Help at Discharge: Family;Available PRN/intermittently;Available 24 hours/day (States that her Mother lives with them and can give 24/7 supervision but no assitance. Husband and family all work.) Type of Home: House Home Access: [place]: Two level;Able to live on main level with bedroom/bathroom Alternate Level Stairs-Number of Steps: 12   Bathroom Shower/Tub: Occupational psychologist: Standard     Home Equipment: Environmental consultant - 2 wheels;Wheelchair -  [employer];Shower seat;Adaptive equipment;Hospital bed;Tub bench;Hand held shower head;Transport chair Adaptive Equipment: Reacher;Sock aid;Long-handled shoe horn (Reports frequently loses her reacher) Additional Comments: At home she lives with family, daughter is an OT and lives nearby, has lots of equipment      Prior Functioning/Environment Level of Independence: Needs assistance  Gait / Transfers Assistance Needed: sits  for shower using tub bench, uses RW ADL's / Homemaking Assistance Needed: Able to do UB dressing, assist needed with LB dressing, or often just wears gowns. Reports difficulty with shoes due to frequent swelling.   Comments: Very pleasant        OT Problem List: Decreased strength;Decreased coordination;Cardiopulmonary status limiting activity;Impaired balance (sitting and/or standing);Impaired UE functional use;Decreased activity tolerance      OT Treatment/Interventions: Therapeutic exercise;Self-care/ADL training;Therapeutic activities;Energy conservation;DME and/or AE instruction;Patient/family education;Balance training    OT Goals(Current goals can be found in the care plan section) Acute Rehab OT Goals Patient Stated Goal: to go home, absolutely not rehab OT Goal Formulation: With patient Time For Goal Achievement: 09/20/20 Potential to Achieve Goals: Good ADL Goals Pt Will Perform Grooming: standing;with modified independence (At least 1 task) Pt Will Perform Upper Body Dressing: with modified independence;with adaptive equipment Pt Will Perform Lower Body Dressing: with modified independence;with adaptive equipment;sit to/from stand Pt Will Transfer to Toilet: with modified independence;ambulating;bedside commode Pt Will Perform Toileting - Clothing Manipulation and hygiene: with modified independence;sitting/lateral leans;sit to/from stand (educate on bidet/toileting aids) Additional ADL Goal #1: Pt will identify at  least 3 energy  conservation tecnhiques to optomize function at home. Additional ADL Goal #2: Pt will tolerate 10 reps BUE AROM HEP in seated position in order to increase activity tolerance and UE strength needed for ADL engagement.  OT Frequency: Min 2X/week   Barriers to D/C:    None known       Co-evaluation              AM-PAC OT "6 Clicks" Daily Activity     Outcome Measure Help from another person eating meals?: A Little Help from another person taking care of personal grooming?: A Little Help from another person toileting, which includes using toliet, bedpan, or urinal?: A Little Help from another person bathing (including washing, rinsing, drying)?: A Little Help from another person to put on and taking off regular upper body clothing?: A Little Help from another person to put on and taking off regular lower body clothing?: A Little 6 Click Score: 18   End of Session Equipment Utilized During Treatment: Gait belt;Rolling walker Nurse Communication: Mobility status  Activity Tolerance: Patient tolerated treatment well Patient left: in chair;with call bell/phone within reach  OT Visit Diagnosis: Unsteadiness on feet (R26.81);History of falling (Z91.81)                Time: 8871-9597 OT Time Calculation (min): 53 min Charges:  OT General Charges $OT Visit: 1 Visit OT Evaluation $OT Eval Low Complexity: 1 Low OT Treatments $Self Care/Home Management : 8-22 mins $Therapeutic Activity: 8-22 mins  Anderson Malta, OT Acute Rehab Services Office: [phone] 09/06/2020   Julien Girt 09/06/2020, 2:35 PM

## 2020-09-06 NOTE — TOC Progression Note (Addendum)
Transition of Care [facility]) - Progression Note    Patient Details  Name: April [name] MRN: [id number] Date of Birth: May 06, 1956  [facility] CM/SW Contact  [name] Ludwig, [place] Phone Number: 09/06/2020, 12:40 PM  Clinical Narrative:    Patient is active with [facility] for home health RN, PT, OT, and Aide.   Expected Discharge Plan: Home/Self Care Barriers to Discharge: Barriers Unresolved (comment)  Expected Discharge Plan and Services Expected Discharge Plan: Home/Self Care   Discharge Planning Services: CM Consult   Living arrangements for the past 2 months: Single Family Home                                       Social Determinants of Health (SDOH) Interventions    Readmission Risk Interventions Readmission Risk Prevention Plan 05/15/2020 04/19/2020 02/09/2020  Transportation Screening Complete Complete Complete  PCP or Specialist Appt within 3-5 Days - - -  Not Complete comments - - -  HRI or [place] or Home Care Consult comments - - -  Social Work Consult for [place] Planning/Counseling - - -  SW consult not completed comments - - -  Palliative Care Screening - - -  Medication Review ([place]) Referral to Pharmacy Complete Referral to Pharmacy  PCP or Specialist appointment within 3-5 days of discharge Complete Complete Complete  HRI or Home Care Consult Complete Complete Complete  SW Recovery Care/Counseling Consult Complete Complete -  Palliative Care Screening Not Applicable Not Applicable Complete  Skilled Nursing Facility Complete Complete Patient Refused  Some recent data might be hidden

## 2020-09-06 NOTE — TOC Transition Note (Signed)
Transition of Care [facility]) - CM/SW Discharge Note   Patient Details  Name: April [name] MRN: [id number] Date of Birth: 08-20-1956  Transition of Care [facility]) CM/SW Contact:  [name] Ludwig, LCSW Phone Number: 09/06/2020, 4:43 PM   Clinical Narrative:     Patient is a 64 year old female from home with husband, patient has been receiving home health services through [facility].  Patient worked with PT and she is adamant that she does not want to go to a SNF.  Per [facility] they feel she would benefit, from going to SNF, but patient is choosing not to go.  CSW to continue to follow patient's progress throughout discharge planning.  Final next level of care: [place] Barriers to Discharge: Continued Medical Work up   Patient Goals and CMS Choice Patient states their goals for this hospitalization and ongoing recovery are:: To return back home and continue with home health services through [facility] CMS Medicare.gov Compare Post Acute Care list provided to:: Patient    Discharge Placement                       Discharge Plan and Services   Discharge Planning Services: CM Consult Post Acute Care Choice: Home Health                    HH Arranged: PT, OT, Nurse's Aide, RN [facility] Agency: [facility] Date [facility] Agency Contacted: 09/06/20 Time [place]: 1400 Representative spoke with at [place]: [place] ([place]) Interventions     Readmission Risk Interventions Readmission Risk Prevention Plan 05/15/2020 04/19/2020 02/09/2020  Transportation Screening Complete Complete Complete  PCP or Specialist Appt within 3-5 Days - - -  Not Complete comments - - -  HRI or [place] or Home Care Consult comments - - -  Social Work Scientific laboratory technician for [place] Planning/Counseling - - -  SW consult not completed comments - - -  Palliative Care Screening - - -  Medication Review ([place]) Referral to  Pharmacy Complete Referral to Pharmacy  PCP or Specialist appointment within 3-5 days of discharge Complete Complete Complete  HRI or Home Care Consult Complete Complete Complete  SW Recovery Care/Counseling Consult Complete Complete -  Palliative Care Screening Not Applicable Not Applicable Complete  Skilled Nursing Facility Complete Complete Patient Refused  Some recent data might be hidden

## 2020-09-07 LAB — CBC WITH DIFFERENTIAL/PLATELET
Abs Immature Granulocytes: 0.05 10*3/uL (ref 0.00–0.07)
Basophils Absolute: 0 10*3/uL (ref 0.0–0.1)
Basophils Relative: 0 %
Eosinophils Absolute: 0 10*3/uL (ref 0.0–0.5)
Eosinophils Relative: 0 %
HCT: 30 % — ABNORMAL LOW (ref 36.0–46.0)
Hemoglobin: 9.3 g/dL — ABNORMAL LOW (ref 12.0–15.0)
Immature Granulocytes: 1 %
Lymphocytes Relative: 13 %
Lymphs Abs: 0.8 10*3/uL (ref 0.7–4.0)
MCH: 31.4 pg (ref 26.0–34.0)
MCHC: 31 g/dL (ref 30.0–36.0)
MCV: 101.4 fL — ABNORMAL HIGH (ref 80.0–100.0)
Monocytes Absolute: 0.2 10*3/uL (ref 0.1–1.0)
Monocytes Relative: 3 %
Neutro Abs: 5.3 10*3/uL (ref 1.7–7.7)
Neutrophils Relative %: 83 %
Platelets: 159 10*3/uL (ref 150–400)
RBC: 2.96 MIL/uL — ABNORMAL LOW (ref 3.87–5.11)
RDW: 19 % — ABNORMAL HIGH (ref 11.5–15.5)
WBC: 6.4 10*3/uL (ref 4.0–10.5)
nRBC: 0 % (ref 0.0–0.2)

## 2020-09-07 LAB — COMPREHENSIVE METABOLIC PANEL
ALT: 8 U/L (ref 0–44)
AST: 15 U/L (ref 15–41)
Albumin: 3 g/dL — ABNORMAL LOW (ref 3.5–5.0)
Alkaline Phosphatase: 40 U/L (ref 38–126)
Anion gap: 14 (ref 5–15)
BUN: 61 mg/dL — ABNORMAL HIGH (ref 8–23)
CO2: 19 mmol/L — ABNORMAL LOW (ref 22–32)
Calcium: 9 mg/dL (ref 8.9–10.3)
Chloride: 100 mmol/L (ref 98–111)
Creatinine, Ser: 2.18 mg/dL — ABNORMAL HIGH (ref 0.44–1.00)
GFR, Estimated: 23 mL/min — ABNORMAL LOW (ref >60)
Glucose, Bld: 85 mg/dL (ref 70–99)
Potassium: 3.1 mmol/L — ABNORMAL LOW (ref 3.5–5.1)
Sodium: 133 mmol/L — ABNORMAL LOW (ref 135–145)
Total Bilirubin: 1 mg/dL (ref 0.3–1.2)
Total Protein: 7.2 g/dL (ref 6.5–8.1)

## 2020-09-07 LAB — GLUCOSE, CAPILLARY
Glucose-Capillary: 104 mg/dL — ABNORMAL HIGH (ref 70–99)
Glucose-Capillary: 108 mg/dL — ABNORMAL HIGH (ref 70–99)
Glucose-Capillary: 116 mg/dL — ABNORMAL HIGH (ref 70–99)
Glucose-Capillary: 117 mg/dL — ABNORMAL HIGH (ref 70–99)
Glucose-Capillary: 120 mg/dL — ABNORMAL HIGH (ref 70–99)
Glucose-Capillary: 96 mg/dL (ref 70–99)
Glucose-Capillary: 99 mg/dL (ref 70–99)

## 2020-09-07 LAB — MAGNESIUM: Magnesium: 2.1 mg/dL (ref 1.7–2.4)

## 2020-09-07 LAB — PHOSPHORUS: Phosphorus: 4.4 mg/dL (ref 2.5–4.6)

## 2020-09-07 LAB — PROCALCITONIN: Procalcitonin: 1.02 ng/mL

## 2020-09-07 MED ORDER — POTASSIUM CHLORIDE 10 MEQ/100ML IV SOLN
10.0000 meq | INTRAVENOUS | Status: DC
  Filled 2020-09-07: qty 100

## 2020-09-07 MED ORDER — FUROSEMIDE 10 MG/ML IJ SOLN
60.0000 mg | Freq: Once | INTRAMUSCULAR | Status: AC
  Administered 2020-09-07: 60 mg via INTRAVENOUS
  Filled 2020-09-07: qty 6

## 2020-09-07 MED ORDER — POTASSIUM CHLORIDE 20 MEQ PO PACK
60.0000 meq | PACK | Freq: Once | ORAL | Status: AC
  Administered 2020-09-07: 60 meq via ORAL
  Filled 2020-09-07: qty 3

## 2020-09-07 NOTE — Progress Notes (Signed)
PROGRESS NOTE    April [name]  HDQ:[id number] DOB: 11/02/1956 DOA: 09/04/2020 PCP: [name], MD   Brief Narrative:   64 y.o. 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  History is obtained from patient, chart, and daughter (somewhat limited from pt as mildly lethargic, though improved as we spoke).  Her symptoms started about 1 week ago.  The pain lasted through the weekend, but on Monday she developed nausea and vomiting in addition to labored breathing.  She was seen at [facility] who recommended she go to [facility].  They diagnosed her with diverticulitis and sent her home with abx and pain meds.  After being home, she was unable to keep things down and continued to have nausea and vomiting.  Home health RN recommended she come to hospital today.  EMS noted glucometer read low.  She had confusion and lethargy.  + fevers to 101.5 on 10/12.  She notes some SOB.  Denies CP.  Notes continued abdominal pain.  Nausea and dry heaving.  Decreased appetite.  Dark urine.  ED Course: Labs, imaging. IVF, abx, IV steroids.  Imaging suggestive of diverticulitis.  Dextrose containing fluids for hypoglycemia.  Admit to hospitalists for sepsis 2/2 diverticulosis with adrenal insufficiency.  Review of Systems: As per HPI otherwise all other systems reviewed and are negative.   Subjective: 10/16 A/O x4, negative CP, negative nausea, negative vomiting.  States takes Lasix at home usually 40 mg adjust as needed dependent upon her weight.  Assessment & Plan:  Covid vaccination; vaccinated  Active Problems:   Sepsis without acute organ dysfunction ([place])   Diverticulitis  Sepsis -On admission patient met criteria for sepsis RR> 20, HR> 90, infected system intestines (diverticulitis)  Diverticulitis  -Patient immunocompromised  secondary to kidney transplant and being on chronic steroids, and immunosuppressants  -Patient received dose of ceftriaxone+ Flagyl in ED -Change to Zosyn.  Given her immunocompromise state would treat for minimal 7 days. Results for KRISTEL, DURKEE (MRN [id number]) as of 09/05/2020 07:34  Ref. Range 09/04/2020 14:17 09/04/2020 16:20  Lactic Acid, Venous Latest Ref Range: 0.5 - 1.9 mmol/L 1.0 0.9  -10/14 patient diagnosed with sepsis we will follow procalcitonin to determine antibiotic usage. Results for REMIE, MATHISON (MRN [id number]) as of 09/07/2020 13:28  Ref. Range 09/05/2020 02:50 09/06/2020 04:19 09/07/2020 07:09  Procalcitonin Latest Units: ng/mL 1.58 1.68 1.49    Acute metabolic encephalopathy -Secondary to sepsis -Resolved   Hypoglycemia  Adrenal Insufficiency: -2/2 acute illness on chronic steroids with prednisone 7.5 mg daily for hx renal transplant.  EMS noted CBG reading low.   -Start stress dose steroids; see hypotension -Q4H blood sugar check  Chronic respiratory failure with hypoxia/tachypnea -On 3 L O2 at home. -Once patient's diverticulitis resolved will obtain ambulatory SPO2  Acute Metabolic Encephalopathy:  -Likely 2/2 sepsis and hypoglycemia.  She's improving, but still lethargic.   -Normal ammonia, pCO2 -TSH WNL, -Vitamin B12 elevated  -Folate WNL -Resolved  Hx Polycystic Kidney Disease s/p renal transplant  CKD IIIb: (Baseline Cr ~1-1.2) Lab Results  Component Value Date   CREATININE 2.18 (H) 09/07/2020   CREATININE 1.84 (H) 09/06/2020   CREATININE 1.24 (H) 09/05/2020   CREATININE 1.31 (H) 09/04/2020   CREATININE 1.20 (H) 09/02/2020  -Basically baseline -Mycophenolate 180 mg BID -Tacrolimus 2 mg daily/1 mg qhs -Valacyclovir 500 mg BID -10/15 CellCept and Tacrolimus level  pending  Abnormal UA:  -Hematuria -Urine culture negative  Chronic diastolic CHF -: echo 11/6943 with EF 55-60%, grade II diastolic dysfunction, mildly elevated  PASP.   -Hold lasix for now with sepsis.  Follow BNP. -Strict in and out +664.67m -Daily weight  Filed Weights   09/05/20 0350 09/05/20 2034 09/06/20 0507  Weight: 69.7 kg 69.5 kg 69.5 kg  -10/16 DC normal saline -10/16 Lasix IV 60 mg x 1; believe patient fluid overloaded.  If creatinine does not trend back down will consult nephrology  Hypotension -10/15 Albumin 25 g -Solu-Cortef 50 mg QID (stress dose steroids); begin to taper on 10/15; addendum hold on taper  COPD  Chronic Hypoxic Resp Failure: -Continue home meds  Smoldering multiple myeloma Follows with Dr. DLorenso Courieroutpatient Following q3 months outpatient   Gout: -Continue allopurinol  Hypertension: -Hold imdur, lasix in setting of sepsis.   -Continue verapamil for now.  GERD: PPI  Umbilical Hernia  Left Inguinal Hernia: -Fat containing umbilical hernia as well as L inguinal hernia with 2.5 cm cystic focus  Hypokalemia -Potassium goal> 4 -10/16 potassium IV 60 mEq   Sacral decubitus ulcer stage III  Pressure Injury 05/27/20 Coccyx Mid Stage 3 -  Full thickness tissue loss. Subcutaneous fat may be visible but bone, tendon or muscle are NOT exposed. (Active)  05/27/20 1918  Location: Coccyx  Location Orientation: Mid  Staging: Stage 3 -  Full thickness tissue loss. Subcutaneous fat may be visible but bone, tendon or muscle are NOT exposed.  Wound Description (Comments):   Present on Admission: Yes     Pressure Injury 05/27/20 Heel Right;Left Stage 1 -  Intact skin with non-blanchable redness of a localized area usually over a bony prominence. (Active)  05/27/20 1918  Location: Heel  Location Orientation: Right;Left  Staging: Stage 1 -  Intact skin with non-blanchable redness of a localized area usually over a bony prominence.  Wound Description (Comments):   Present on Admission: Yes     Pressure Injury 09/04/20 Buttocks Left Stage 2 -  Partial thickness loss of dermis presenting as a shallow open  injury with a red, pink wound bed without slough. (Active)  09/04/20 1900  Location: Buttocks  Location Orientation: Left  Staging: Stage 2 -  Partial thickness loss of dermis presenting as a shallow open injury with a red, pink wound bed without slough.  Wound Description (Comments):   Present on Admission: Yes       DVT prophylaxis: Lovenox Code Status: Full Family Communication:  Status is: Inpatient    Dispo: The patient is from: Home              Anticipated d/c is to: Home              Anticipated d/c date is: 10/21              Patient currently unstable      Consultants:    Procedures/Significant Events:     I have personally reviewed and interpreted all radiology studies and my findings are as above.  VENTILATOR SETTINGS: Room air 10/16 SPO2 94%   Cultures 10/13 blood RIGHT AC NGTD 13 blood RIGHT hand NGTD 10/13 urine negative 10/13 MRSA by PCR negative   Antimicrobials: Anti-infectives (From admission, onward)   Start     Ordered Stop   09/04/20 2200  valACYclovir (VALTREX) tablet 500 mg        09/04/20 1934     09/04/20 2200  piperacillin-tazobactam (ZOSYN) IVPB 3.375  g        09/04/20 1837     09/04/20 1330  cefTRIAXone (ROCEPHIN) 2 g in sodium chloride 0.9 % 100 mL IVPB        09/04/20 1329 09/04/20 1445   09/04/20 1330  metroNIDAZOLE (FLAGYL) IVPB 500 mg        09/04/20 1329 09/04/20 1522       Devices    LINES / TUBES:      Continuous Infusions: . piperacillin-tazobactam (ZOSYN)  IV 3.375 g (09/07/20 0623)     Objective: Vitals:   09/06/20 1424 09/06/20 1845 09/06/20 2200 09/07/20 0621  BP: (!) 108/54 (!) 120/51 (!) 113/50 (!) 116/58  Pulse:  64 63 71  Resp:  (!) _0 Temp:  97.8 F (36.6 C) 97.9 F (36.6 C) 97.6 F (36.4 C)  TempSrc:  Oral Oral Oral  SpO2:  94% 96% 94%  Weight:      Height:        Intake/Output Summary (Last 24 hours) at 09/07/2020 1610 Last data filed at 09/06/2020 1650 Gross per 24  hour  Intake --  Output 450 ml  Net -450 ml   Filed Weights   09/05/20 0350 09/05/20 2034 09/06/20 0507  Weight: 69.7 kg 69.5 kg 69.5 kg   Physical Exam:  General: A/O x4, No acute respiratory distress Eyes: negative scleral hemorrhage, negative anisocoria, negative icterus ENT: Negative Runny nose, negative gingival bleeding, Neck:  Negative scars, masses, torticollis, lymphadenopathy, JVD Lungs: Clear to auscultation bilaterally without wheezes or crackles Cardiovascular: Regular rate and rhythm without murmur gallop or rub normal S1 and S2 Abdomen: Positive abdominal pain palpation but significantly improved, nondistended, positive soft, bowel sounds, no rebound, no ascites, no appreciable mass Extremities: No significant cyanosis, clubbing, or edema bilateral lower extremities Skin: Negative rashes, lesions, ulcers Psychiatric:  Negative depression, negative anxiety, negative fatigue, negative mania  Central nervous system:  Cranial nerves II through XII intact, tongue/uvula midline, all extremities muscle strength 5/5, sensation intact throughout, negative dysarthria, negative expressive aphasia, negative receptive aphasia.  .     Data Reviewed: Care during the described time interval was provided by me .  I have reviewed this patient's available data, including medical history, events of note, physical examination, and all test results as part of my evaluation.   CBC: Recent Labs  Lab 09/02/20 2049 09/04/20 1417 09/05/20 0250 09/06/20 0419  WBC 8.0 18.3* 11.2* 7.1  NEUTROABS  --  16.1*  --  6.2  HGB 10.6* 11.3* 10.0* 8.8*  HCT 34.7* 36.8 32.7* 28.4*  MCV 102.4* 101.4* 101.2* 100.7*  PLT 207 190 167 960   Basic Metabolic Panel: Recent Labs  Lab 09/02/20 2049 09/04/20 1417 09/04/20 1418 09/05/20 0250 09/06/20 0419  NA 134* 128*  --  134* 131*  K 4.1 3.7  --  4.3 4.1  CL 101 96*  --  100 99  CO2 22 21*  --  21* 21*  GLUCOSE 118* 112*  --  164* 136*  BUN 29*  30*  --  33* 52*  CREATININE 1.20* 1.31*  --  1.24* 1.84*  CALCIUM 9.8 9.6  --  9.4 8.7*  MG  --   --  1.7  --  2.0  PHOS  --   --   --   --  5.3*   GFR: Estimated Creatinine Clearance: 27.9 mL/min (A) (by C-G formula based on SCr of 1.84 mg/dL (H)). Liver Function Tests: Recent Labs  Lab 09/02/20 2049  09/04/20 1417 09/05/20 0250 09/06/20 0419  AST _0 13*  ALT _1 ALKPHOS 67 74 59 45  BILITOT 2.1* 2.6* 1.6* 0.9  PROT 8.4* 8.9* 7.7 6.7  ALBUMIN 3.4* 3.4* 2.8* 2.6*   Recent Labs  Lab 09/02/20 2049  LIPASE 34   Recent Labs  Lab 09/04/20 1418  AMMONIA 18   Coagulation Profile: Recent Labs  Lab 09/04/20 1417  INR 1.5*   Cardiac Enzymes: No results for input(s): CKTOTAL, CKMB, CKMBINDEX, TROPONINI in the last 168 hours. BNP (last 3 results) No results for input(s): PROBNP in the last 8760 hours. HbA1C: No results for input(s): HGBA1C in the last 72 hours. CBG: Recent Labs  Lab 09/06/20 1151 09/06/20 1627 09/06/20 2203 09/07/20 0210 09/07/20 0617  GLUCAP 136* 107* 91 104* 99   Lipid Profile: No results for input(s): CHOL, HDL, LDLCALC, TRIG, CHOLHDL, LDLDIRECT in the last 72 hours. Thyroid Function Tests: Recent Labs    09/04/20 1417  TSH 1.544   Anemia Panel: Recent Labs    09/04/20 1417  VITAMINB12 2,749*  FOLATE 16.2   Urine analysis:    Component Value Date/Time   COLORURINE AMBER (A) 09/04/2020 1527   APPEARANCEUR CLOUDY (A) 09/04/2020 1527   LABSPEC 1.017 09/04/2020 1527   PHURINE 5.0 09/04/2020 1527   GLUCOSEU NEGATIVE 09/04/2020 1527   HGBUR SMALL (A) 09/04/2020 1527   BILIRUBINUR NEGATIVE 09/04/2020 1527   KETONESUR 5 (A) 09/04/2020 1527   PROTEINUR >=300 (A) 09/04/2020 1527   UROBILINOGEN 0.2 05/15/2013 0733   NITRITE NEGATIVE 09/04/2020 1527   LEUKOCYTESUR NEGATIVE 09/04/2020 1527   Sepsis Labs: _2 (procalcitonin:4,lacticidven:4)  ) Recent Results (from the past 240 hour(s))  Respiratory Panel by RT PCR  (Flu A&B, Covid) - Nasopharyngeal Swab     Status: None   Collection Time: 09/04/20  2:14 PM   Specimen: Nasopharyngeal Swab  Result Value Ref Range Status   SARS Coronavirus 2 by RT PCR NEGATIVE NEGATIVE Final    Comment: (NOTE) SARS-CoV-2 target nucleic acids are NOT DETECTED.  The SARS-CoV-2 RNA is generally detectable in upper respiratoy specimens during the acute phase of infection. The lowest concentration of SARS-CoV-2 viral copies this assay can detect is 131 copies/mL. A negative result does not preclude SARS-Cov-2 infection and should not be used as the sole basis for treatment or other patient management decisions. A negative result may occur with  improper specimen collection/handling, submission of specimen other than nasopharyngeal swab, presence of viral mutation(s) within the areas targeted by this assay, and inadequate number of viral copies (<131 copies/mL). A negative result must be combined with clinical observations, patient history, and epidemiological information. The expected result is Negative.  Fact Sheet for Patients:  PinkCheek.be  Fact Sheet for Healthcare Providers:  GravelBags.it  This test is no t yet approved or cleared by the [place] FDA and  has been authorized for detection and/or diagnosis of SARS-CoV-2 by FDA under an Emergency Use Authorization (EUA). This EUA will remain  in effect (meaning this test can be used) for the duration of the COVID-19 declaration under Section 564(b)(1) of the Act, 21 U.S.C. section 360bbb-3(b)(1), unless the authorization is terminated or revoked sooner.     Influenza A by PCR NEGATIVE NEGATIVE Final   Influenza B by PCR NEGATIVE NEGATIVE Final    Comment: (NOTE) The Xpert Xpress SARS-CoV-2/FLU/RSV assay is intended as an aid in  the diagnosis of influenza from Nasopharyngeal swab specimens and  should not be used  as a sole basis for treatment.  Nasal washings and  aspirates are unacceptable for Xpert Xpress SARS-CoV-2/FLU/RSV  testing.  Fact Sheet for Patients: PinkCheek.be  Fact Sheet for Healthcare Providers: GravelBags.it  This test is not yet approved or cleared by the [place] FDA and  has been authorized for detection and/or diagnosis of SARS-CoV-2 by  FDA under an Emergency Use Authorization (EUA). This EUA will remain  in effect (meaning this test can be used) for the duration of the  Covid-19 declaration under Section 564(b)(1) of the Act, 21  U.S.C. section 360bbb-3(b)(1), unless the authorization is  terminated or revoked. Performed at [facility], [place] [address], [place], [place] [address]   Blood Culture (routine x 2)     Status: None (Preliminary result)   Collection Time: 09/04/20  2:18 PM   Specimen: BLOOD  Result Value Ref Range Status   Specimen Description   Final    BLOOD RIGHT ANTECUBITAL Performed at [facility] [address] [place], [place] [address]    Special Requests   Final    BOTTLES DRAWN AEROBIC AND ANAEROBIC Blood Culture adequate volume Performed at [facility] [address], [place], [place] [address]    Culture   Final    NO GROWTH 3 DAYS Performed at [facility], [place] [address], [place], [place] [address]    Report Status PENDING  Incomplete  Urine culture     Status: None   Collection Time: 09/04/20  3:27 PM   Specimen: In/Out Cath Urine  Result Value Ref Range Status   Specimen Description   Final    IN/OUT CATH URINE Performed at [facility] [address] [place], [place] [address]    Special Requests   Final    NONE Performed at [facility], [place] [address] [place], [place] [address]    Culture   Final    NO GROWTH Performed at [facility], [place] [address] [place], [place] [address]     Report Status 09/05/2020 FINAL  Final  MRSA PCR Screening     Status: None   Collection Time: 09/04/20  6:11 PM   Specimen: Nasal Mucosa; Nasopharyngeal  Result Value Ref Range Status   MRSA by PCR NEGATIVE NEGATIVE Final    Comment:        The GeneXpert MRSA Assay (FDA approved for NASAL specimens only), is one component of a comprehensive MRSA colonization surveillance program. It is not intended to diagnose MRSA infection nor to guide or monitor treatment for MRSA infections. Performed at [facility], [place] [address] [place], [place] [address]   Blood Culture (routine x 2)     Status: None (Preliminary result)   Collection Time: 09/04/20  7:12 PM   Specimen: BLOOD RIGHT HAND  Result Value Ref Range Status   Specimen Description   Final    BLOOD RIGHT HAND Performed at [facility] [address], [place], [place] [address]    Special Requests   Final    BOTTLES DRAWN AEROBIC AND ANAEROBIC Blood Culture adequate volume Performed at [facility] [address] [place], [place] [address]    Culture   Final    NO GROWTH 3 DAYS Performed at [facility], [place] [address], [place], [place] [address]    Report Status PENDING  Incomplete         Radiology Studies:  DG CHEST PORT 1 VIEW  Result Date: 09/05/2020 CLINICAL DATA:  Dyspnea, COPD, CHF EXAM: PORTABLE CHEST 1 VIEW COMPARISON:  Chest radiograph from one day prior. FINDINGS: Stable cardiomediastinal silhouette with mild cardiomegaly. No pneumothorax. No pleural effusion. No overt pulmonary edema. Mild bibasilar scarring versus atelectasis, similar. IMPRESSION: 1. Stable mild cardiomegaly without overt pulmonary edema. 2. Stable mild bibasilar scarring versus atelectasis. Electronically Signed   By: Ilona Sorrel M.D.   On: 09/05/2020 08:40        Scheduled Meds: . albuterol  2 puff Inhalation Once  . allopurinol  300 mg Oral Daily  . ascorbic  acid  500 mg Oral Daily  . aspirin  81 mg Oral Daily  . brimonidine  1 drop Both Eyes BID  . Budeson-Glycopyrrol-Formoterol  2 puff Inhalation BID  . cholecalciferol  1,000 Units Oral Daily  . cycloSPORINE  1 drop Both Eyes BID  . dorzolamide  1 drop Both Eyes Daily  . enoxaparin (LOVENOX) injection  30 mg Subcutaneous Q24H  . ferrous sulfate  325 mg Oral Q breakfast  . gabapentin  200 mg Oral TID  . hydrocortisone sod succinate (SOLU-CORTEF) inj  50 mg Intravenous Q6H  . latanoprost  1 drop Both Eyes QHS  . mouth rinse  15 mL Mouth Rinse BID  . mycophenolate  180 mg Oral BID  . pantoprazole  40 mg Oral BID  . tacrolimus  2 mg Oral Daily   And  . tacrolimus  1 mg Oral QHS  . valACYclovir  500 mg Oral BID  . verapamil  240 mg Oral Daily  . cyanocobalamin  1,000 mcg Oral Daily   Continuous Infusions: . piperacillin-tazobactam (ZOSYN)  IV 3.375 g (09/07/20 9381)     LOS: 3 days   The patient is critically ill with multiple organ systems failure and requires high complexity decision making for assessment and support, frequent evaluation and titration of therapies, application of advanced monitoring technologies and extensive interpretation of multiple databases. Critical Care Time devoted to patient care services described in this note  Time spent: 40 minutes     Lizbett Garciagarcia, Geraldo Docker, MD [facility] Pager [phone]  If 7PM-7AM, please contact night-coverage [email or web address] Password [facility] 09/07/2020, 7:24 AM

## 2020-09-07 NOTE — Progress Notes (Signed)
Physical Therapy Treatment Patient Details Name: April [name] MRN: [id number] DOB: Feb 21, 1956 Today's Date: 09/07/2020    History of Present Illness Patient is 64 y.o. chronically ill 64 year old female with medical history of CHF, COPD on chronic 2 L, hypertension, reflux, polycystic disease s/p transplant.  Came in by EMS 10/13 , found to have low CBG given D10 with improvement in mentation.  Was seen 2 days ago and diagnosed with diverticulitis states that she has been vomiting and unable to keep her medications down    PT Comments    Pt progressing toward PT goals. Improved activity tolerance today however still continues to fatigue easilt. Recommend HHPT, will benefit from continued PT in acute setting    Follow Up Recommendations  Home health PT;Supervision for mobility/OOB     Equipment Recommendations  None recommended by PT    Recommendations for Other Services       Precautions / Restrictions Precautions Precautions: Fall Precaution Comments: no dizziness reported this session  (reported "vertigo last PT session) Restrictions Weight Bearing Restrictions: No    Mobility  Bed Mobility Overal bed mobility: Needs Assistance Bed Mobility: Supine to Sit     Supine to sit: Min guard;HOB elevated;Supervision     General bed mobility comments: incr time, use of rail, no physical assist   Transfers Overall transfer level: Needs assistance Equipment used: Rolling walker (2 wheeled) Transfers: Sit to/from Stand Sit to Stand: Min guard         General transfer comment: incr time, cues for hand placement. min/guard for safety on rising and to transition to RW   Ambulation/Gait Ambulation/Gait assistance: Min guard;Min assist Gait Distance (Feet): 20 Feet (x2) Assistive device: Rolling walker (2 wheeled) Gait Pattern/deviations: Step-through pattern;Decreased stride length         Stairs             Wheelchair Mobility    Modified Rankin  (Stroke Patients Only)       Balance     Sitting balance-Leahy Scale: Fair     Standing balance support: During functional activity;Bilateral upper extremity supported Standing balance-Leahy Scale: Fair Standing balance comment: reliant on RW                            Cognition Arousal/Alertness: Awake/alert Behavior During Therapy: WFL for tasks assessed/performed Overall Cognitive Status: Within Functional Limits for tasks assessed                                        Exercises      General Comments        Pertinent Vitals/Pain Pain Assessment: No/denies pain Pain Intervention(s): Monitored during session    Home Living                      Prior Function            PT Goals (current goals can now be found in the care plan section) Acute Rehab PT Goals Patient Stated Goal: to go home, absolutely not rehab PT Goal Formulation: With patient Time For Goal Achievement: 09/19/20 Potential to Achieve Goals: Good Progress towards PT goals: Progressing toward goals    Frequency    Min 3X/week      PT Plan Current plan remains appropriate    Co-evaluation  AM-PAC PT "6 Clicks" Mobility   Outcome Measure  Help needed turning from your back to your side while in a flat bed without using bedrails?: A Little Help needed moving from lying on your back to sitting on the side of a flat bed without using bedrails?: A Little Help needed moving to and from a bed to a chair (including a wheelchair)?: A Little Help needed standing up from a chair using your arms (e.g., wheelchair or bedside chair)?: A Little Help needed to walk in hospital room?: A Little Help needed climbing 3-5 steps with a railing? : A Lot 6 Click Score: 17    End of Session Equipment Utilized During Treatment: Gait belt Activity Tolerance: Patient tolerated treatment well;Patient limited by fatigue Patient left: in chair;with call  bell/phone within reach   PT Visit Diagnosis: Unsteadiness on feet (R26.81);Difficulty in walking, not elsewhere classified (R26.2)     Time: 4445-8483 PT Time Calculation (min) (ACUTE ONLY): 27 min  Charges:  $Gait Training: 23-37 mins                     Baxter Flattery, PT  Acute Rehab Dept ([place]) [phone] Pager [phone]  09/07/2020    [facility] 09/07/2020, 12:55 PM

## 2020-09-08 ENCOUNTER — Other Ambulatory Visit: Payer: Self-pay

## 2020-09-08 ENCOUNTER — Inpatient Hospital Stay (HOSPITAL_COMMUNITY): Payer: Medicare Other

## 2020-09-08 DIAGNOSIS — N179 Acute kidney failure, unspecified: Secondary | ICD-10-CM | POA: Diagnosis present

## 2020-09-08 LAB — URINALYSIS, ROUTINE W REFLEX MICROSCOPIC
Bacteria, UA: NONE SEEN
Bilirubin Urine: NEGATIVE
Glucose, UA: NEGATIVE mg/dL
Ketones, ur: NEGATIVE mg/dL
Leukocytes,Ua: NEGATIVE
Nitrite: NEGATIVE
Protein, ur: NEGATIVE mg/dL
Specific Gravity, Urine: 1.011 (ref 1.005–1.030)
pH: 5 (ref 5.0–8.0)

## 2020-09-08 LAB — COMPREHENSIVE METABOLIC PANEL
ALT: 9 U/L (ref 0–44)
AST: 11 U/L — ABNORMAL LOW (ref 15–41)
Albumin: 3 g/dL — ABNORMAL LOW (ref 3.5–5.0)
Alkaline Phosphatase: 35 U/L — ABNORMAL LOW (ref 38–126)
Anion gap: 13 (ref 5–15)
BUN: 71 mg/dL — ABNORMAL HIGH (ref 8–23)
CO2: 18 mmol/L — ABNORMAL LOW (ref 22–32)
Calcium: 9.2 mg/dL (ref 8.9–10.3)
Chloride: 100 mmol/L (ref 98–111)
Creatinine, Ser: 2.55 mg/dL — ABNORMAL HIGH (ref 0.44–1.00)
GFR, Estimated: 19 mL/min — ABNORMAL LOW (ref >60)
Glucose, Bld: 111 mg/dL — ABNORMAL HIGH (ref 70–99)
Potassium: 3.8 mmol/L (ref 3.5–5.1)
Sodium: 131 mmol/L — ABNORMAL LOW (ref 135–145)
Total Bilirubin: 1.2 mg/dL (ref 0.3–1.2)
Total Protein: 7.2 g/dL (ref 6.5–8.1)

## 2020-09-08 LAB — CBC WITH DIFFERENTIAL/PLATELET
Abs Immature Granulocytes: 0.06 10*3/uL (ref 0.00–0.07)
Basophils Absolute: 0 10*3/uL (ref 0.0–0.1)
Basophils Relative: 0 %
Eosinophils Absolute: 0 10*3/uL (ref 0.0–0.5)
Eosinophils Relative: 0 %
HCT: 27.9 % — ABNORMAL LOW (ref 36.0–46.0)
Hemoglobin: 8.7 g/dL — ABNORMAL LOW (ref 12.0–15.0)
Immature Granulocytes: 2 %
Lymphocytes Relative: 9 %
Lymphs Abs: 0.4 10*3/uL — ABNORMAL LOW (ref 0.7–4.0)
MCH: 31.2 pg (ref 26.0–34.0)
MCHC: 31.2 g/dL (ref 30.0–36.0)
MCV: 100 fL (ref 80.0–100.0)
Monocytes Absolute: 0.1 10*3/uL (ref 0.1–1.0)
Monocytes Relative: 3 %
Neutro Abs: 3.3 10*3/uL (ref 1.7–7.7)
Neutrophils Relative %: 86 %
Platelets: 123 10*3/uL — ABNORMAL LOW (ref 150–400)
RBC: 2.79 MIL/uL — ABNORMAL LOW (ref 3.87–5.11)
RDW: 18.5 % — ABNORMAL HIGH (ref 11.5–15.5)
WBC: 3.8 10*3/uL — ABNORMAL LOW (ref 4.0–10.5)
nRBC: 0 % (ref 0.0–0.2)

## 2020-09-08 LAB — MAGNESIUM: Magnesium: 2.1 mg/dL (ref 1.7–2.4)

## 2020-09-08 LAB — GLUCOSE, CAPILLARY
Glucose-Capillary: 109 mg/dL — ABNORMAL HIGH (ref 70–99)
Glucose-Capillary: 120 mg/dL — ABNORMAL HIGH (ref 70–99)
Glucose-Capillary: 159 mg/dL — ABNORMAL HIGH (ref 70–99)
Glucose-Capillary: 169 mg/dL — ABNORMAL HIGH (ref 70–99)
Glucose-Capillary: 192 mg/dL — ABNORMAL HIGH (ref 70–99)
Glucose-Capillary: 99 mg/dL (ref 70–99)

## 2020-09-08 LAB — CREATININE, URINE, RANDOM: Creatinine, Urine: 47.22 mg/dL

## 2020-09-08 LAB — PHOSPHORUS: Phosphorus: 4.7 mg/dL — ABNORMAL HIGH (ref 2.5–4.6)

## 2020-09-08 LAB — SODIUM, URINE, RANDOM: Sodium, Ur: <10 mmol/L

## 2020-09-08 MED ORDER — METRONIDAZOLE IN NACL 5-0.79 MG/ML-% IV SOLN
500.0000 mg | Freq: Three times a day (TID) | INTRAVENOUS | Status: DC
  Administered 2020-09-08 – 2020-09-11 (×10): 500 mg via INTRAVENOUS
  Filled 2020-09-08 (×10): qty 100

## 2020-09-08 MED ORDER — SODIUM CHLORIDE 0.9 % IV SOLN
2.0000 g | INTRAVENOUS | Status: DC
  Administered 2020-09-08 – 2020-09-11 (×4): 2 g via INTRAVENOUS
  Filled 2020-09-08 (×4): qty 2

## 2020-09-08 NOTE — Progress Notes (Signed)
PROGRESS NOTE    April [name]  XMI:[id number] DOB: 05/22/1956 DOA: 09/04/2020 PCP: [name], MD   Brief Narrative:   64 y.o. 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  History is obtained from patient, chart, and daughter (somewhat limited from pt as mildly lethargic, though improved as we spoke).  Her symptoms started about 1 week ago.  The pain lasted through the weekend, but on Monday she developed nausea and vomiting in addition to labored breathing.  She was seen at [facility] who recommended she go to [facility].  They diagnosed her with diverticulitis and sent her home with abx and pain meds.  After being home, she was unable to keep things down and continued to have nausea and vomiting.  Home health RN recommended she come to hospital today.  EMS noted glucometer read low.  She had confusion and lethargy.  + fevers to 101.5 on 10/12.  She notes some SOB.  Denies CP.  Notes continued abdominal pain.  Nausea and dry heaving.  Decreased appetite.  Dark urine.  ED Course: Labs, imaging. IVF, abx, IV steroids.  Imaging suggestive of diverticulitis.  Dextrose containing fluids for hypoglycemia.  Admit to hospitalists for sepsis 2/2 diverticulosis with adrenal insufficiency.  Review of Systems: As per HPI otherwise all other systems reviewed and are negative.   Subjective: 10/17 afebrile overnight A/O x4, negative CP, negative nausea, negative vomiting.  Some diarrhea which she attributes to her liquid diet.    Assessment & Plan:  Covid vaccination; vaccinated  Active Problems:   Sepsis without acute organ dysfunction ([place])   Diverticulitis   Acute renal failure superimposed on stage 3b chronic kidney disease ([place])  Sepsis -On admission patient met criteria for sepsis RR> 20, HR> 90, infected system  intestines (diverticulitis)  Diverticulitis  -Patient immunocompromised secondary to kidney transplant and being on chronic steroids, and immunosuppressants  -Patient received dose of ceftriaxone+ Flagyl in ED -Change to Zosyn.  Given her immunocompromise state would treat for minimal 7 days. Results for MODELL, FENDRICK (MRN [id number]) as of 09/05/2020 07:34  Ref. Range 09/04/2020 14:17 09/04/2020 16:20  Lactic Acid, Venous Latest Ref Range: 0.5 - 1.9 mmol/L 1.0 0.9  -10/14 patient diagnosed with sepsis we will follow procalcitonin to determine antibiotic usage. Results for BOBETTE, LEYH (MRN [id number]) as of 09/07/2020 13:28  Ref. Range 09/05/2020 02:50 09/06/2020 04:19 09/07/2020 07:09  Procalcitonin Latest Units: ng/mL 1.58 1.68 0.38    Acute metabolic encephalopathy -Secondary to sepsis -Resolved   Hypoglycemia  Adrenal Insufficiency: -2/2 acute illness on chronic steroids with prednisone 7.5 mg daily for hx renal transplant.  EMS noted CBG reading low.   -Start stress dose steroids; see hypotension -Q4H blood sugar check  Chronic respiratory failure with hypoxia/tachypnea -On 3 L O2 at home. -Once patient's diverticulitis resolved will obtain ambulatory SPO2  Acute Metabolic Encephalopathy:  -Likely 2/2 sepsis and hypoglycemia.  She's improving, but still lethargic.   -Normal ammonia, pCO2 -TSH WNL, -Vitamin B12 elevated  -Folate WNL -Resolved  Hx Polycystic Kidney Disease s/p renal transplant  Acute on CKD IIIb: (Baseline Cr ~1-1.2)/ATN? Lab Results  Component Value Date   CREATININE 2.55 (H) 09/08/2020   CREATININE 2.18 (H) 09/07/2020   CREATININE 1.84 (H) 09/06/2020   CREATININE 1.24 (H) 09/05/2020   CREATININE 1.31 (H) 09/04/2020  -Basically baseline -Mycophenolate 180 mg BID -Tacrolimus 2  mg daily/1 mg qhs -Valacyclovir 500 mg BID -10/15 CellCept and Tacrolimus level pending -10/17 patient's renal function continues to deteriorate Zosyn may be  contributing to patient's continued acute on CKD, therefore will discontinue Zosyn.  Discussed case with pharmacy will change antibiotic to Ceftriaxone + Flagyl believe less toxic combination in this critically ill patient -10/17 discussed case with Dr. Rennis Petty nephrology who agreed to see patient requested repeat UA and [place] of transplanted kidney with Doppler pending.  Abnormal UA:  -Hematuria -Urine culture negative -10/17 repeat urinalysis pending per nephrology request   Chronic diastolic CHF -: echo 01/8100 with EF 55-60%, grade II diastolic dysfunction, mildly elevated PASP.   -Hold lasix for now with sepsis.  Follow BNP. -Strict in and out +1.8 L -Daily weight  Filed Weights   09/05/20 0350 09/05/20 2034 09/06/20 0507  Weight: 69.7 kg 69.5 kg 69.5 kg  -10/16 DC normal saline -10/16 Lasix IV 60 mg x 1; believe patient fluid overloaded.  If creatinine does not trend back down will consult nephrology  Hypotension -10/15 Albumin 25 g -Solu-Cortef 50 mg QID (stress dose steroids); begin to taper on 10/15; addendum hold on taper  COPD  Chronic Hypoxic Resp Failure: -Continue home meds  Smoldering multiple myeloma -Follows with Dr. Lorenso Courier outpatient -Following q3 months outpatient   Gout: -Allopurinol 300 mg daily  Hypertension: -Hold imdur, lasix in setting of sepsis.   -Continue verapamil for now.  GERD: PPI  Umbilical Hernia  Left Inguinal Hernia: -Fat containing umbilical hernia as well as L inguinal hernia with 2.5 cm cystic focus  Hypokalemia -Potassium goal> 4 -10/16 potassium IV 60 mEq   Sacral decubitus ulcer stage III  Pressure Injury 05/27/20 Coccyx Mid Stage 3 -  Full thickness tissue loss. Subcutaneous fat may be visible but bone, tendon or muscle are NOT exposed. (Active)  05/27/20 1918  Location: Coccyx  Location Orientation: Mid  Staging: Stage 3 -  Full thickness tissue loss. Subcutaneous fat may be visible but bone, tendon or  muscle are NOT exposed.  Wound Description (Comments):   Present on Admission: Yes     Pressure Injury 05/27/20 Heel Right;Left Stage 1 -  Intact skin with non-blanchable redness of a localized area usually over a bony prominence. (Active)  05/27/20 1918  Location: Heel  Location Orientation: Right;Left  Staging: Stage 1 -  Intact skin with non-blanchable redness of a localized area usually over a bony prominence.  Wound Description (Comments):   Present on Admission: Yes     Pressure Injury 09/04/20 Buttocks Left Stage 2 -  Partial thickness loss of dermis presenting as a shallow open injury with a red, pink wound bed without slough. (Active)  09/04/20 1900  Location: Buttocks  Location Orientation: Left  Staging: Stage 2 -  Partial thickness loss of dermis presenting as a shallow open injury with a red, pink wound bed without slough.  Wound Description (Comments):   Present on Admission: Yes       DVT prophylaxis: Lovenox Code Status: Full Family Communication:  Status is: Inpatient    Dispo: The patient is from: Home              Anticipated d/c is to: Home              Anticipated d/c date is: 10/21              Patient currently unstable      Consultants:  Dr. Rennis Petty nephrology  Procedures/Significant Events:  10/17 [place] transplanted kidney with Doppler pending     I have personally reviewed and interpreted all radiology studies and my findings are as above.  VENTILATOR SETTINGS: Room air 10/17 SPO2 96%   Cultures 10/13 blood RIGHT AC NGTD 13 blood RIGHT hand NGTD 10/13 urine negative 10/13 MRSA by PCR negative 10/17 urinalysis pending   Antimicrobials: Anti-infectives (From admission, onward)   Start     Ordered Stop   09/04/20 2200  valACYclovir (VALTREX) tablet 500 mg        09/04/20 1934     09/04/20 2200  piperacillin-tazobactam (ZOSYN) IVPB 3.375 g        09/04/20 1837     09/04/20 1330  cefTRIAXone (ROCEPHIN) 2 g in sodium  chloride 0.9 % 100 mL IVPB        09/04/20 1329 09/04/20 1445   09/04/20 1330  metroNIDAZOLE (FLAGYL) IVPB 500 mg        09/04/20 1329 09/04/20 1522       Devices    LINES / TUBES:      Continuous Infusions: . cefTRIAXone (ROCEPHIN)  IV    . metronidazole       Objective: Vitals:   09/07/20 1344 09/07/20 2014 09/08/20 0614 09/08/20 0938  BP: 112/69 111/67 (!) 123/55 126/61  Pulse: 74 74 66 72  Resp: 18  18 (!) 21  Temp: 97.9 F (36.6 C)  97.7 F (36.5 C) 97.6 F (36.4 C)  TempSrc: Oral  Oral Oral  SpO2: 95% 97% 96% 96%  Weight:      Height:        Intake/Output Summary (Last 24 hours) at 09/08/2020 1228 Last data filed at 09/08/2020 1000 Gross per 24 hour  Intake 1345.16 ml  Output --  Net 1345.16 ml   Filed Weights   09/05/20 0350 09/05/20 2034 09/06/20 0507  Weight: 69.7 kg 69.5 kg 69.5 kg   Physical Exam:  General: A/O x4, No acute respiratory distress Eyes: negative scleral hemorrhage, negative anisocoria, negative icterus ENT: Negative Runny nose, negative gingival bleeding, Neck:  Negative scars, masses, torticollis, lymphadenopathy, JVD Lungs: Clear to auscultation bilaterally without wheezes or crackles Cardiovascular: Regular rate and rhythm without murmur gallop or rub normal S1 and S2 Abdomen: Positive abdominal pain palpation but significantly improved, nondistended, positive soft, bowel sounds, no rebound, no ascites, no appreciable mass Extremities: No significant cyanosis, clubbing, or edema bilateral lower extremities Skin: Negative rashes, lesions, ulcers Psychiatric:  Negative depression, negative anxiety, negative fatigue, negative mania  Central nervous system:  Cranial nerves II through XII intact, tongue/uvula midline, all extremities muscle strength 5/5, sensation intact throughout, negative dysarthria, negative expressive aphasia, negative receptive aphasia.  .     Data Reviewed: Care during the described time interval was  provided by me .  I have reviewed this patient's available data, including medical history, events of note, physical examination, and all test results as part of my evaluation.   CBC: Recent Labs  Lab 09/04/20 1417 09/05/20 0250 09/06/20 0419 09/07/20 0709 09/08/20 0707  WBC 18.3* 11.2* 7.1 6.4 3.8*  NEUTROABS 16.1*  --  6.2 5.3 3.3  HGB 11.3* 10.0* 8.8* 9.3* 8.7*  HCT 36.8 32.7* 28.4* 30.0* 27.9*  MCV 101.4* 101.2* 100.7* 101.4* 100.0  PLT 190 167 153 159 678*   Basic Metabolic Panel: Recent Labs  Lab 09/04/20 1417 09/04/20 1418 09/05/20 0250 09/06/20 0419 09/07/20 0709 09/08/20 0707  NA 128*  --  134* 131* 133* 131*  K 3.7  --  4.3 4.1 3.1* 3.8  CL 96*  --  100 99 100 100  CO2 21*  --  21* 21* 19* 18*  GLUCOSE 112*  --  164* 136* 85 111*  BUN 30*  --  33* 52* 61* 71*  CREATININE 1.31*  --  1.24* 1.84* 2.18* 2.55*  CALCIUM 9.6  --  9.4 8.7* 9.0 9.2  MG  --  1.7  --  2.0 2.1 2.1  PHOS  --   --   --  5.3* 4.4 4.7*   GFR: Estimated Creatinine Clearance: 20.1 mL/min (A) (by C-G formula based on SCr of 2.55 mg/dL (H)). Liver Function Tests: Recent Labs  Lab 09/04/20 1417 09/05/20 0250 09/06/20 0419 09/07/20 0709 09/08/20 0707  AST 21 18 13* 15 11*  ALT 10 8 8 8 9   ALKPHOS 74 59 45 40 35*  BILITOT 2.6* 1.6* 0.9 1.0 1.2  PROT 8.9* 7.7 6.7 7.2 7.2  ALBUMIN 3.4* 2.8* 2.6* 3.0* 3.0*   Recent Labs  Lab 09/02/20 2049  LIPASE 34   Recent Labs  Lab 09/04/20 1418  AMMONIA 18   Coagulation Profile: Recent Labs  Lab 09/04/20 1417  INR 1.5*   Cardiac Enzymes: No results for input(s): CKTOTAL, CKMB, CKMBINDEX, TROPONINI in the last 168 hours. BNP (last 3 results) No results for input(s): PROBNP in the last 8760 hours. HbA1C: No results for input(s): HGBA1C in the last 72 hours. CBG: Recent Labs  Lab 09/07/20 1723 09/07/20 2010 09/07/20 2344 09/08/20 0612 09/08/20 0822  GLUCAP 116* 120* 108* 109* 99   Lipid Profile: No results for input(s): CHOL,  HDL, LDLCALC, TRIG, CHOLHDL, LDLDIRECT in the last 72 hours. Thyroid Function Tests: No results for input(s): TSH, T4TOTAL, FREET4, T3FREE, THYROIDAB in the last 72 hours. Anemia Panel: No results for input(s): VITAMINB12, FOLATE, FERRITIN, TIBC, IRON, RETICCTPCT in the last 72 hours. Urine analysis:    Component Value Date/Time   COLORURINE AMBER (A) 09/04/2020 1527   APPEARANCEUR CLOUDY (A) 09/04/2020 1527   LABSPEC 1.017 09/04/2020 1527   PHURINE 5.0 09/04/2020 1527   GLUCOSEU NEGATIVE 09/04/2020 1527   HGBUR SMALL (A) 09/04/2020 1527   BILIRUBINUR NEGATIVE 09/04/2020 1527   KETONESUR 5 (A) 09/04/2020 1527   PROTEINUR >=300 (A) 09/04/2020 1527   UROBILINOGEN 0.2 05/15/2013 0733   NITRITE NEGATIVE 09/04/2020 1527   LEUKOCYTESUR NEGATIVE 09/04/2020 1527   Sepsis Labs: @LABRCNTIP (procalcitonin:4,lacticidven:4)  ) Recent Results (from the past 240 hour(s))  Respiratory Panel by RT PCR (Flu A&B, Covid) - Nasopharyngeal Swab     Status: None   Collection Time: 09/04/20  2:14 PM   Specimen: Nasopharyngeal Swab  Result Value Ref Range Status   SARS Coronavirus 2 by RT PCR NEGATIVE NEGATIVE Final    Comment: (NOTE) SARS-CoV-2 target nucleic acids are NOT DETECTED.  The SARS-CoV-2 RNA is generally detectable in upper respiratoy specimens during the acute phase of infection. The lowest concentration of SARS-CoV-2 viral copies this assay can detect is 131 copies/mL. A negative result does not preclude SARS-Cov-2 infection and should not be used as the sole basis for treatment or other patient management decisions. A negative result may occur with  improper specimen collection/handling, submission of specimen other than nasopharyngeal swab, presence of viral mutation(s) within the areas targeted by this assay, and inadequate number of viral copies (<131 copies/mL). A negative result must be combined with clinical observations, patient history, and epidemiological information.  The expected result is Negative.  Fact Sheet for Patients:  PinkCheek.be  Fact  Sheet for Healthcare Providers:  GravelBags.it  This test is no t yet approved or cleared by the [place] FDA and  has been authorized for detection and/or diagnosis of SARS-CoV-2 by FDA under an Emergency Use Authorization (EUA). This EUA will remain  in effect (meaning this test can be used) for the duration of the COVID-19 declaration under Section 564(b)(1) of the Act, 21 U.S.C. section 360bbb-3(b)(1), unless the authorization is terminated or revoked sooner.     Influenza A by PCR NEGATIVE NEGATIVE Final   Influenza B by PCR NEGATIVE NEGATIVE Final    Comment: (NOTE) The Xpert Xpress SARS-CoV-2/FLU/RSV assay is intended as an aid in  the diagnosis of influenza from Nasopharyngeal swab specimens and  should not be used as a sole basis for treatment. Nasal washings and  aspirates are unacceptable for Xpert Xpress SARS-CoV-2/FLU/RSV  testing.  Fact Sheet for Patients: PinkCheek.be  Fact Sheet for Healthcare Providers: GravelBags.it  This test is not yet approved or cleared by the [place] FDA and  has been authorized for detection and/or diagnosis of SARS-CoV-2 by  FDA under an Emergency Use Authorization (EUA). This EUA will remain  in effect (meaning this test can be used) for the duration of the  Covid-19 declaration under Section 564(b)(1) of the Act, 21  U.S.C. section 360bbb-3(b)(1), unless the authorization is  terminated or revoked. Performed at [facility], [place] [address] [place], [place] [address]   Blood Culture (routine x 2)     Status: None (Preliminary result)   Collection Time: 09/04/20  2:18 PM   Specimen: BLOOD  Result Value Ref Range Status   Specimen Description   Final    BLOOD RIGHT ANTECUBITAL Performed at [place] [address] [place], [place] [address]    Special Requests   Final    BOTTLES DRAWN AEROBIC AND ANAEROBIC Blood Culture adequate volume Performed at [place] [address] [place], [place] [address]    Culture   Final    NO GROWTH 4 DAYS Performed at [facility], [place] [address], [place], [place] [address]    Report Status PENDING  Incomplete  Urine culture     Status: None   Collection Time: 09/04/20  3:27 PM   Specimen: In/Out Cath Urine  Result Value Ref Range Status   Specimen Description   Final    IN/OUT CATH URINE Performed at [place] [address], [place], [place] [address]    Special Requests   Final    NONE Performed at [facility], [place] [address] [place],  [address]    Culture   Final    NO GROWTH Performed at [facility], [place] [address], [place],  [address]    Report Status 09/05/2020 FINAL  Final  MRSA PCR Screening     Status: None   Collection Time: 09/04/20  6:11 PM   Specimen: Nasal Mucosa; Nasopharyngeal  Result Value Ref Range Status   MRSA by PCR NEGATIVE NEGATIVE Final    Comment:        The GeneXpert MRSA Assay (FDA approved for NASAL specimens only), is one component of a comprehensive MRSA colonization surveillance program. It is not intended to diagnose MRSA infection nor to guide or monitor treatment for MRSA infections. Performed at [facility], [place] [address], [place],  [address]   Blood Culture (routine x 2)  Status: None (Preliminary result)   Collection Time: 09/04/20  7:12 PM   Specimen: BLOOD RIGHT HAND  Result Value Ref Range Status   Specimen Description   Final    BLOOD RIGHT HAND Performed at [place] [address], [place], [place] [address]    Special Requests   Final    BOTTLES DRAWN AEROBIC AND ANAEROBIC Blood Culture adequate volume Performed  at [place] [address] [place], [place] [address]    Culture   Final    NO GROWTH 4 DAYS Performed at [facility], [place] [address] [place], [place] [address]    Report Status PENDING  Incomplete         Radiology Studies: No results found.      Scheduled Meds: . albuterol  2 puff Inhalation Once  . allopurinol  300 mg Oral Daily  . ascorbic acid  500 mg Oral Daily  . aspirin  81 mg Oral Daily  . brimonidine  1 drop Both Eyes BID  . Budeson-Glycopyrrol-Formoterol  2 puff Inhalation BID  . cholecalciferol  1,000 Units Oral Daily  . cycloSPORINE  1 drop Both Eyes BID  . dorzolamide  1 drop Both Eyes Daily  . enoxaparin (LOVENOX) injection  30 mg Subcutaneous Q24H  . ferrous sulfate  325 mg Oral Q breakfast  . gabapentin  200 mg Oral TID  . hydrocortisone sod succinate (SOLU-CORTEF) inj  50 mg Intravenous Q6H  . latanoprost  1 drop Both Eyes QHS  . mouth rinse  15 mL Mouth Rinse BID  . mycophenolate  180 mg Oral BID  . pantoprazole  40 mg Oral BID  . tacrolimus  2 mg Oral Daily   And  . tacrolimus  1 mg Oral QHS  . valACYclovir  500 mg Oral BID  . verapamil  240 mg Oral Daily  . cyanocobalamin  1,000 mcg Oral Daily   Continuous Infusions: . cefTRIAXone (ROCEPHIN)  IV    . metronidazole       LOS: 4 days   The patient is critically ill with multiple organ systems failure and requires high complexity decision making for assessment and support, frequent evaluation and titration of therapies, application of advanced monitoring technologies and extensive interpretation of multiple databases. Critical Care Time devoted to patient care services described in this note  Time spent: 40 minutes     Salvador Bigbee, Geraldo Docker, MD [facility] Pager [phone]  If 7PM-7AM, please contact night-coverage [email or web address] Password [facility] 09/08/2020, 12:28 PM

## 2020-09-08 NOTE — Consult Note (Signed)
April [name] Admit Date: 09/04/2020 09/08/2020 April [name] Requesting Physician:  April Laming MD  Reason for Consult:  AKI in transplant  HPI:  37F with history of kidney transplant and 2013 with a primary disease of polycystic kidneys; maintained on tacrolimus/mycophenolate/prednisone; baseline creatinine 1-1.2; original transplant in [place] and followed by Dr. Jimmy [name], now Dr. Royce [name] in our office who developed abdominal pain and fevers and presented to the emergency room on 10/13 where she was diagnosed with diverticulitis.  Originally placed on ceftriaxone and Flagyl, transition to Zosyn, and today back towards ceftriaxone and Flagyl.  She was hydrated.  Presenting creatinine was 1.2, at her baseline and has progressed to 2.56 today.  K is 3.8, HCO3 18, BUN 71.  UA at presentation with heavy proteinuria and hematuria which is unusual for her.  Blood cultures are no growth to date.  Urine culture at presentation was no growth final.  No NSAID exposures.  No nephrotoxins or IV contrast.  CT of the abdomen without contrast on 10/13 noted no abnormalities of the right lower quadrant transplanted kidney.  Urine output is not recorded.  Labs in September this year with a UPC of 0.3.  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  COPD on 2 to 3 L at baseline  HFpEF  Hypertension  GERD  Patent left upper extremity AV fistula     Creatinine (mg/dL)  Date Value  08/05/2020 1.30 (H)  06/10/2020 1.07 (H)   Creatinine, Ser (mg/dL)  Date Value  09/08/2020 2.55 (H)  09/07/2020 2.18 (H)  09/06/2020 1.84 (H)  09/05/2020 1.24 (H)  09/04/2020 1.31 (H)  09/02/2020 1.20 (H)  07/18/2020 1.39 (H)  07/03/2020 1.17 (H)  07/02/2020 1.00  07/01/2020 1.02 (H)  ] I/Os: I/O last 3 completed shifts: In: 1295.2 [P.O.:960; IV Piggyback:335.2] Out: -    ROS NSAIDS: No identified exposure IV Contrast no identified exposure TMP/SMX no identified exposure Hypotension not  present Balance of 12 systems is negative w/ exceptions as above  PMH  Past Medical History:  Diagnosis Date  . Arthritis   . Asthma   . CHF (congestive heart failure) ([place])   . COPD (chronic obstructive pulmonary disease) ([place])   . Depression   . Essential hypertension 02/08/2020  . GERD (gastroesophageal reflux disease)   . Hyperlipidemia   . Hyperparathyroidism   . Polycystic kidney   . PONV (postoperative nausea and vomiting)    PSH  Past Surgical History:  Procedure Laterality Date  . 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 BILATERAL OOPHORECTOMY    . BIOPSY  05/19/2020   Procedure: BIOPSY;  Surgeon: Carol Ada, MD;  Location: [facility] ENDOSCOPY;  Service: Endoscopy;;  . CARPAL TUNNEL RELEASE  2000  . CESAREAN SECTION    . COLONOSCOPY WITH PROPOFOL N/A 05/19/2020   Procedure: COLONOSCOPY WITH PROPOFOL;  Surgeon: Carol Ada, MD;  Location: [place];  Service: Endoscopy;  Laterality: N/A;  . ENTEROSCOPY N/A 05/19/2020   Procedure: ENTEROSCOPY;  Surgeon: Carol Ada, MD;  Location: [place];  Service: Endoscopy;  Laterality: N/A;  . INSERTION OF DIALYSIS CATHETER  03/31/2012   Procedure: INSERTION OF DIALYSIS CATHETER;  Surgeon: Angelia Mould, MD;  Location: [place];  Service: Vascular;  Laterality: N/A;  insertion of dialysis catheter right internal jugular vein  . 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 ORIF TIBIA FRACTURE Left 12/23/2018   Procedure: OPEN REDUCTION INTERNAL FIXATION (ORIF) TIBIA FRACTURE;  Surgeon: Doreatha Martin,  Thomasene Lot, MD;  Location: [place];  Service: Orthopedics;  Laterality: Left;  . ORIF TIBIA PLATEAU Right 12/23/2018   Procedure: OPEN REDUCTION INTERNAL FIXATION (ORIF) TIBIAL PLATEAU;  Surgeon: Shona Needles, MD;  Location: [place];  Service: Orthopedics;  Laterality: Right;  . ORIF WRIST FRACTURE Left 12/08/2019   Procedure: OPEN REDUCTION INTERNAL FIXATION (ORIF) WRIST FRACTURE, REPAIR LACERATION LEFT FOREARM;  Surgeon: Dorna Leitz, MD;  Location: [facility];  Service: Orthopedics;  Laterality: Left;  . TONSILLECTOMY  1967  . TOTAL HIP ARTHROPLASTY Left 12/08/2019   Procedure: TOTAL HIP ARTHROPLASTY ANTERIOR APPROACH;  Surgeon: Dorna Leitz, MD;  Location: [facility];  Service: Orthopedics;  Laterality: Left;  . TUBAL LIGATION  2010   FH  Family History  Problem Relation Age of Onset  . Hypertension Mother   . Heart disease Father        CABG history  . Polycystic kidney disease Father   . Polycystic kidney disease Brother    SH  reports that she quit smoking about 22 years ago. Her smoking use included cigarettes. She has a 3.75 pack-year smoking history. She has never used smokeless tobacco. She reports that she does not drink alcohol and does not use drugs. Allergies  Allergies  Allergen Reactions  . Infed [Iron Dextran] Other (See Comments)    Chest tightness  . Pentamidine Itching, Shortness Of Breath and Swelling  . Erythromycin [Erythromycin] Other (See Comments)    Mouth Ulcers  . Iohexol Other (See Comments)    Per patient "she has had a kidney transplant and should never have contrast"  . Oxycodone Nausea Only and Nausea And Vomiting  . Erythromycin Rash    Causes breakout in mouth  . Ultram [Tramadol Hcl] Anxiety   Home medications Prior to Admission medications   Medication Sig Start Date End Date Taking? Authorizing Provider  acetaminophen (TYLENOL) 325 MG tablet Take 2 tablets (650 mg total) by mouth every 6 (six) hours as needed for mild pain (or Fever >/= 101). 05/22/20  Yes Elgergawy, Silver Huguenin, MD  albuterol (VENTOLIN HFA) 108 (90 Base) MCG/ACT inhaler Inhale 2 puffs into the lungs every 4 (four) hours as needed for wheezing or shortness of breath. 01/04/20  Yes Angiulli, Lavon Paganini, PA-C  allopurinol (ZYLOPRIM) 300 MG tablet Take 1 tablet (300 mg total) by mouth daily. 01/04/20  Yes Angiulli, Lavon Paganini, PA-C  amoxicillin-clavulanate (AUGMENTIN) 875-125 MG tablet Take 1 tablet by mouth every 12  (twelve) hours. 09/02/20  Yes Curatolo, Adam, DO  ascorbic acid (VITAMIN C) 500 MG tablet Take 500 mg by mouth daily.   Yes [provider]  aspirin 81 MG chewable tablet Chew 81 mg by mouth daily.   Yes [provider]  brimonidine (ALPHAGAN P) 0.1 % SOLN Place 1 drop into both eyes 2 (two) times daily.    Yes [provider]  Budeson-Glycopyrrol-Formoterol (BREZTRI AEROSPHERE) 160-9-4.8 MCG/ACT AERO Inhale 2 puffs into the lungs 2 (two) times daily. 08/20/20  Yes Martyn Ehrich, NP  cholecalciferol (VITAMIN D3) 25 MCG (1000 UNIT) tablet Take 1 tablet (1,000 Units total) by mouth daily. 01/04/20  Yes Angiulli, Lavon Paganini, PA-C  cyanocobalamin 1000 MCG tablet Take 1,000 mcg by mouth daily.   Yes [provider]  cycloSPORINE (RESTASIS) 0.05 % ophthalmic emulsion Place 1 drop into both eyes 2 (two) times daily. 01/04/20  Yes Angiulli, Lavon Paganini, PA-C  dorzolamide (TRUSOPT) 2 % ophthalmic solution Place 1 drop into both eyes daily. 06/25/19  Yes [provider]  ferrous sulfate 325 (65 FE) MG tablet Take 325 mg by mouth daily with breakfast.   Yes [provider]  FLUoxetine (PROZAC) 40 MG capsule Take 1 capsule (40 mg total) by mouth daily. 01/04/20  Yes Angiulli, Lavon Paganini, PA-C  furosemide (LASIX) 40 MG tablet Take 1 tablet (40 mg total) by mouth daily. 02/07/20  Yes Sheikh, Omair Latif, DO  gabapentin (NEURONTIN) 100 MG capsule Take 200 mg by mouth 3 (three) times daily.   Yes [provider]  ipratropium-albuterol (DUONEB) 0.5-2.5 (3) MG/3ML SOLN Take 3 mLs by nebulization every 6 (six) hours as needed (sob/wheezing).  08/24/20  Yes [provider]  isosorbide mononitrate (IMDUR) 60 MG 24 hr tablet Take 1 tablet (60 mg total) by mouth daily. 05/23/20  Yes Elgergawy, Silver Huguenin, MD  magnesium oxide (MAG-OX) 400 MG tablet Take 1 tablet (400 mg total) by mouth 2 (two) times daily. 01/04/20  Yes Angiulli, Lavon Paganini, PA-C  mycophenolate  (MYFORTIC) 180 MG EC tablet Take 180 mg by mouth 2 (two) times daily.   Yes [provider]  oxyCODONE (ROXICODONE) 5 MG immediate release tablet Take 1 tablet (5 mg total) by mouth every 6 (six) hours as needed for up to 15 doses for breakthrough pain. 09/02/20  Yes Curatolo, Adam, DO  pantoprazole (PROTONIX) 40 MG tablet Take 1 tablet (40 mg total) by mouth 2 (two) times daily. 01/04/20  Yes Angiulli, Lavon Paganini, PA-C  predniSONE (DELTASONE) 5 MG tablet Take 7.5 mg by mouth daily with breakfast.   Yes [provider]  PROGRAF 1 MG capsule Take 1-2 mg by mouth daily. 2mg  in the morning 1mg  in the afternoon 03/22/19  Yes [provider]  promethazine (PHENERGAN) 25 MG tablet Take 1 tablet (25 mg total) by mouth every 8 (eight) hours as needed for up to 15 doses for nausea or vomiting. 09/02/20  Yes Curatolo, Adam, DO  TRAVATAN Z 0.004 % SOLN ophthalmic solution Place 1 drop into both eyes at bedtime. 07/03/16  Yes [provider]  valACYclovir (VALTREX) 500 MG tablet Take 500 mg by mouth 2 (two) times daily.   Yes [provider]  verapamil (CALAN-SR) 240 MG CR tablet Take 240 mg by mouth daily.   Yes [provider]  amoxicillin-clavulanate (AUGMENTIN) 875-125 MG tablet Take 1 tablet by mouth 2 (two) times daily for 10 days. 09/02/20 09/12/20  Curatolo, Adam, DO  Budeson-Glycopyrrol-Formoterol (BREZTRI AEROSPHERE) 160-9-4.8 MCG/ACT AERO Inhale 2 puffs into the lungs in the morning and at bedtime. 08/20/20   Martyn Ehrich, NP  levalbuterol Penne Lash) 0.63 MG/3ML nebulizer solution Take 0.63 mg by nebulization every 6 (six) hours as needed for wheezing or shortness of breath. Inhale 0.63mg  once daily.  May take 0.63mg  every six hours as needed COPD.    [provider]  Multiple Vitamin (MULTIVITAMIN WITH MINERALS) TABS tablet Take 1 tablet by mouth daily. Patient not taking: Reported on 09/04/2020 02/24/20   Alma Friendly, MD     Current Medications Scheduled Meds: . albuterol  2 puff Inhalation Once  . allopurinol  300 mg Oral Daily  . ascorbic acid  500 mg Oral Daily  . aspirin  81 mg Oral Daily  . brimonidine  1 drop Both Eyes BID  . Budeson-Glycopyrrol-Formoterol  2 puff Inhalation BID  . cholecalciferol  1,000 Units Oral Daily  . cycloSPORINE  1 drop Both Eyes BID  . dorzolamide  1 drop Both Eyes Daily  .  enoxaparin (LOVENOX) injection  30 mg Subcutaneous Q24H  . ferrous sulfate  325 mg Oral Q breakfast  . gabapentin  200 mg Oral TID  . hydrocortisone sod succinate (SOLU-CORTEF) inj  50 mg Intravenous Q6H  . latanoprost  1 drop Both Eyes QHS  . mouth rinse  15 mL Mouth Rinse BID  . mycophenolate  180 mg Oral BID  . pantoprazole  40 mg Oral BID  . tacrolimus  2 mg Oral Daily   And  . tacrolimus  1 mg Oral QHS  . valACYclovir  500 mg Oral BID  . verapamil  240 mg Oral Daily  . cyanocobalamin  1,000 mcg Oral Daily   Continuous Infusions: . cefTRIAXone (ROCEPHIN)  IV 2 g (09/08/20 1351)  . metronidazole     PRN Meds:.acetaminophen **OR** acetaminophen, albuterol, ipratropium-albuterol, ondansetron **OR** ondansetron (ZOFRAN) IV, polyethylene glycol  CBC Recent Labs  Lab 09/06/20 0419 09/07/20 0709 09/08/20 0707  WBC 7.1 6.4 3.8*  NEUTROABS 6.2 5.3 3.3  HGB 8.8* 9.3* 8.7*  HCT 28.4* 30.0* 27.9*  MCV 100.7* 101.4* 100.0  PLT 153 159 741*   Basic Metabolic Panel Recent Labs  Lab 09/02/20 2049 09/04/20 1417 09/05/20 0250 09/06/20 0419 09/07/20 0709 09/08/20 0707  NA 134* 128* 134* 131* 133* 131*  K 4.1 3.7 4.3 4.1 3.1* 3.8  CL 101 96* 100 99 100 100  CO2 22 21* 21* 21* 19* 18*  GLUCOSE 118* 112* 164* 136* 85 111*  BUN 29* 30* 33* 52* 61* 71*  CREATININE 1.20* 1.31* 1.24* 1.84* 2.18* 2.55*  CALCIUM 9.8 9.6 9.4 8.7* 9.0 9.2  PHOS  --   --   --  5.3* 4.4 4.7*    Physical Exam  Blood pressure 126/61, pulse 72, temperature 97.6 F (36.4 C), temperature source Oral, resp. rate  (!) 21, height 5\' 1"  (1.549 m), weight 69.5 kg, SpO2 96 %. GEN: NAD, conversant ENT: NCAT EYES: EOMI CV: Regular, normal S1 and S2 PULM: Faint bibasilar crackles, otherwise clear bilaterally ABD: Soft, nontender, no tenderness in the right lower quadrant SKIN: No rashes or lesions other than scattered ecchymoses EXT: No peripheral edema Vascular: Left upper extremity AV fistula with bruit and thrill, is enlarged and tortuous   Assessment 71F s/p KT 2013 on MMF/Tac/Pred, BL SCr 1 to 1.2 with AKI in setting of acute diverticulitis.    1. AKI: Admission imaging negative for acute structural renal issues; has been on outpatient immunosuppression; no clear nephrotoxins; UA at presentation with hematuria and proteinuria which is not completely expected.  Likely has ATN related to #2.  Doubt this is directly because of antibiotic 2. Diverticulitis, improving, tolerating p.o., previously on Zosyn. 3. Chronic immunosuppression on tacrolimus/mycophenolate/prednisone 4. Anemia, hemoglobin stable in the eights, mild thrombocytopenia; smoldering myeloma 5. Patent left upper extremity AV fistula 6. ADPKD 7. COPD 8. Hypertension, normotensive currently  Plan 1. Repeat UA 2. Transplant renal ultrasound 3. Check urine sodium and urine creatinine 4. Continue supportive care 5. I suspect will recover GFR in the next 24 to 40 hours 6. Daily weights, Daily Renal Panel, Strict I/Os, Avoid nephrotoxins (NSAIDs, judicious IV Contrast)    April [name]  09/08/2020, 2:37 PM

## 2020-09-08 NOTE — Plan of Care (Signed)
  Problem: Education: Goal: Knowledge of General Education information will improve Description: Including pain rating scale, medication(s)/side effects and non-pharmacologic comfort measures Outcome: Progressing   Problem: Health Behavior/Discharge Planning: Goal: Ability to manage health-related needs will improve Outcome: Progressing   Problem: Clinical Measurements: Goal: Will remain free from infection Outcome: Progressing Goal: Respiratory complications will improve Outcome: Progressing Goal: Cardiovascular complication will be avoided Outcome: Progressing   Problem: Nutrition: Goal: Adequate nutrition will be maintained Outcome: Progressing   Problem: Pain Managment: Goal: General experience of comfort will improve Outcome: Progressing

## 2020-09-09 LAB — CULTURE, BLOOD (ROUTINE X 2)
Culture: NO GROWTH
Culture: NO GROWTH
Special Requests: ADEQUATE
Special Requests: ADEQUATE

## 2020-09-09 LAB — GLUCOSE, CAPILLARY
Glucose-Capillary: 118 mg/dL — ABNORMAL HIGH (ref 70–99)
Glucose-Capillary: 152 mg/dL — ABNORMAL HIGH (ref 70–99)
Glucose-Capillary: 163 mg/dL — ABNORMAL HIGH (ref 70–99)
Glucose-Capillary: 203 mg/dL — ABNORMAL HIGH (ref 70–99)
Glucose-Capillary: 233 mg/dL — ABNORMAL HIGH (ref 70–99)

## 2020-09-09 LAB — CBC WITH DIFFERENTIAL/PLATELET
Abs Immature Granulocytes: 0.04 10*3/uL (ref 0.00–0.07)
Basophils Absolute: 0 10*3/uL (ref 0.0–0.1)
Basophils Relative: 0 %
Eosinophils Absolute: 0 10*3/uL (ref 0.0–0.5)
Eosinophils Relative: 0 %
HCT: 28.5 % — ABNORMAL LOW (ref 36.0–46.0)
Hemoglobin: 8.9 g/dL — ABNORMAL LOW (ref 12.0–15.0)
Immature Granulocytes: 1 %
Lymphocytes Relative: 6 %
Lymphs Abs: 0.2 10*3/uL — ABNORMAL LOW (ref 0.7–4.0)
MCH: 31.1 pg (ref 26.0–34.0)
MCHC: 31.2 g/dL (ref 30.0–36.0)
MCV: 99.7 fL (ref 80.0–100.0)
Monocytes Absolute: 0.2 10*3/uL (ref 0.1–1.0)
Monocytes Relative: 4 %
Neutro Abs: 3.2 10*3/uL (ref 1.7–7.7)
Neutrophils Relative %: 89 %
Platelets: 128 10*3/uL — ABNORMAL LOW (ref 150–400)
RBC: 2.86 MIL/uL — ABNORMAL LOW (ref 3.87–5.11)
RDW: 18.3 % — ABNORMAL HIGH (ref 11.5–15.5)
WBC: 3.6 10*3/uL — ABNORMAL LOW (ref 4.0–10.5)
nRBC: 0 % (ref 0.0–0.2)

## 2020-09-09 LAB — MYCOPHENOLIC ACID (CELLCEPT)
MPA Glucuronide: 44 ug/mL (ref 15–125)
MPA: 0.3 ug/mL — ABNORMAL LOW (ref 1.0–3.5)

## 2020-09-09 LAB — COMPREHENSIVE METABOLIC PANEL
ALT: 9 U/L (ref 0–44)
AST: 14 U/L — ABNORMAL LOW (ref 15–41)
Albumin: 3 g/dL — ABNORMAL LOW (ref 3.5–5.0)
Alkaline Phosphatase: 39 U/L (ref 38–126)
Anion gap: 13 (ref 5–15)
BUN: 66 mg/dL — ABNORMAL HIGH (ref 8–23)
CO2: 19 mmol/L — ABNORMAL LOW (ref 22–32)
Calcium: 9.5 mg/dL (ref 8.9–10.3)
Chloride: 99 mmol/L (ref 98–111)
Creatinine, Ser: 2.58 mg/dL — ABNORMAL HIGH (ref 0.44–1.00)
GFR, Estimated: 19 mL/min — ABNORMAL LOW (ref >60)
Glucose, Bld: 128 mg/dL — ABNORMAL HIGH (ref 70–99)
Potassium: 3.3 mmol/L — ABNORMAL LOW (ref 3.5–5.1)
Sodium: 131 mmol/L — ABNORMAL LOW (ref 135–145)
Total Bilirubin: 0.7 mg/dL (ref 0.3–1.2)
Total Protein: 7.2 g/dL (ref 6.5–8.1)

## 2020-09-09 LAB — MAGNESIUM: Magnesium: 1.9 mg/dL (ref 1.7–2.4)

## 2020-09-09 LAB — PHOSPHORUS: Phosphorus: 4.2 mg/dL (ref 2.5–4.6)

## 2020-09-09 MED ORDER — POTASSIUM CHLORIDE CRYS ER 20 MEQ PO TBCR
50.0000 meq | EXTENDED_RELEASE_TABLET | Freq: Once | ORAL | Status: AC
  Administered 2020-09-09: 50 meq via ORAL
  Filled 2020-09-09: qty 1

## 2020-09-09 MED ORDER — ALLOPURINOL 100 MG PO TABS
100.0000 mg | ORAL_TABLET | Freq: Every day | ORAL | Status: DC
  Administered 2020-09-10 – 2020-09-12 (×3): 100 mg via ORAL
  Filled 2020-09-09 (×3): qty 1

## 2020-09-09 MED ORDER — SODIUM CHLORIDE 0.9 % IV SOLN: INTRAVENOUS | Status: AC

## 2020-09-09 MED ORDER — HEPARIN SODIUM (PORCINE) 5000 UNIT/ML IJ SOLN
5000.0000 [IU] | Freq: Three times a day (TID) | INTRAMUSCULAR | Status: DC
  Administered 2020-09-09 – 2020-09-10 (×2): 5000 [IU] via SUBCUTANEOUS
  Filled 2020-09-09 (×3): qty 1

## 2020-09-09 MED ORDER — PREDNISONE 5 MG PO TABS
7.5000 mg | ORAL_TABLET | Freq: Every day | ORAL | Status: DC
  Administered 2020-09-10 – 2020-09-12 (×3): 7.5 mg via ORAL
  Filled 2020-09-09 (×3): qty 2

## 2020-09-09 NOTE — Progress Notes (Signed)
Physical Therapy Treatment Patient Details Name: April [name] MRN: [id number] DOB: 24-Jun-1956 Today's Date: 09/09/2020    History of Present Illness Patient is 64 y.o. chronically ill 64 year old female with medical history of CHF, COPD on chronic 2 L, hypertension, reflux, polycystic disease s/p transplant.  Came in by EMS 10/13 , found to have low CBG given D10 with improvement in mentation.  Was seen 2 days ago and diagnosed with diverticulitis states that she has been vomiting and unable to keep her medications down    PT Comments    Pt participated fairly well. Dyspnea 2/4 and fatigue with short distance ambulation to/from bathroom. Pt stated she wears O2 PRN at home. Pt stated she plans to return home after discharge-she does not want to go to a SNF.    Follow Up Recommendations  Home health PT;Supervision/Assistance - 24 hour     Equipment Recommendations  None recommended by PT    Recommendations for Other Services       Precautions / Restrictions Precautions Precautions: Fall Precaution Comments: mild dizziness (hx of chronic vertigo) Restrictions Weight Bearing Restrictions: No    Mobility  Bed Mobility Overal bed mobility: Needs Assistance Bed Mobility: Supine to Sit;Sit to Supine     Supine to sit: Min guard;HOB elevated Sit to supine: Min guard;HOB elevated   General bed mobility comments: incr time, use of rail, no physical assist. pt used UEs to assist LEs back onto bed.  Transfers Overall transfer level: Needs assistance Equipment used: Rolling walker (2 wheeled) Transfers: Sit to/from Stand Sit to Stand: Min assist         General transfer comment: Assist to power up, stabilize, control descent. Cues for safety, hand placement.  Ambulation/Gait Ambulation/Gait assistance: Min assist Gait Distance (Feet): 15 Feet (2) Assistive device: Rolling walker (2 wheeled) Gait Pattern/deviations: Step-through pattern;Decreased stride length      General Gait Details: Assist to stabilize throughout short distance. Unsteady. Dyspnea 2/4. O2 95% on RA end of session.   Stairs             Wheelchair Mobility    Modified Rankin (Stroke Patients Only)       Balance Overall balance assessment: Needs assistance;History of Falls         Standing balance support: Bilateral upper extremity supported Standing balance-Leahy Scale: Poor                              Cognition Arousal/Alertness: Awake/alert Behavior During Therapy: WFL for tasks assessed/performed Overall Cognitive Status: Within Functional Limits for tasks assessed                                        Exercises      General Comments        Pertinent Vitals/Pain Pain Assessment: Faces Faces Pain Scale: Hurts little more Pain Location: abdomen Pain Descriptors / Indicators: Discomfort Pain Intervention(s): Monitored during session;Limited activity within patient's tolerance    Home Living                      Prior Function            PT Goals (current goals can now be found in the care plan section) Progress towards PT goals: Progressing toward goals    Frequency    Min 3X/week  PT Plan Current plan remains appropriate    Co-evaluation              AM-PAC PT "6 Clicks" Mobility   Outcome Measure  Help needed turning from your back to your side while in a flat bed without using bedrails?: A Little Help needed moving from lying on your back to sitting on the side of a flat bed without using bedrails?: A Little Help needed moving to and from a bed to a chair (including a wheelchair)?: A Little Help needed standing up from a chair using your arms (e.g., wheelchair or bedside chair)?: A Little Help needed to walk in hospital room?: A Little Help needed climbing 3-5 steps with a railing? : A Lot 6 Click Score: 17    End of Session Equipment Utilized During Treatment: Gait  belt Activity Tolerance: Patient limited by fatigue Patient left: in bed;with call bell/phone within reach;with bed alarm set   PT Visit Diagnosis: History of falling (Z91.81);Difficulty in walking, not elsewhere classified (R26.2);Muscle weakness (generalized) (M62.81)     Time: 1433-1500 PT Time Calculation (min) (ACUTE ONLY): 27 min  Charges:  $Gait Training: 23-37 mins                        Doreatha Massed, PT Acute Rehabilitation  Office: [phone] Pager: [phone]

## 2020-09-09 NOTE — Progress Notes (Addendum)
[facility] Progress Note  Subjective: seen in room, abd pain better, thirsty, dry mouth  Vitals:   09/08/20 1653 09/08/20 2017 09/09/20 0500 09/09/20 1200  BP: (!) 110/57 127/60 110/70 132/66  Pulse: 80 72 80 82  Resp: 20 20 (!) 21   Temp: 98 F (36.7 C) 98 F (36.7 C) 97.9 F (36.6 C)   TempSrc: Oral  Oral   SpO2: 95% 94%    Weight:   71.5 kg   Height:        Exam:   alert, nad   no jvd  Chest cta bilat, occ basilar rales  Cor reg no RG  Abd soft ntnd no ascites   Ext 1+ L pretib edema, no RLE edema   Alert, NF, ox3    Repeat UA 10/17 - 0-5 rbc/ wbc   UNa <10, UCr 47    Summary:  59F s/p KT 2013 on MMF/Tac/Pred, BL SCr 1 to 1.2 with AKI in setting of acute diverticulitis.    Assessment/ Plan: 1. AKI: Admission imaging negative for acute structural renal issues; has been on outpatient immunosuppression; no clear nephrotoxins; UA at presentation with hematuria and proteinuria but repeat UA is normal. Transplant RUS is wnl. Urine lytes suggest dehydration. Likely has ATN related to #2, and/or vol depletion.   Doubt this is directly because of antibiotic effects. Appears to be stabilizing, creat today is stable from yesterday. Start gentle IVF's. F/u labs in am.  2. Diverticulitis, improving, tolerating p.o., previously on Zosyn. 3. Chronic immunosuppression: on tacrolimus, mycophenolate and prednisone. Suggest change IV hydrocortisone to po pred 7.5 qd when ready per primary  4. Anemia, hemoglobin stable in the eights, mild thrombocytopenia; smoldering myeloma 5. Patent LUE AVF 6. ADPKD 7. COPD 8. Hypertension, normotensive currently     Rob Echo Propp 09/09/2020, 1:09 PM   Recent Labs  Lab 09/08/20 0707 09/09/20 0507  K 3.8 3.3*  BUN 71* 66*  CREATININE 2.55* 2.58*  CALCIUM 9.2 9.5  PHOS 4.7* 4.2  HGB 8.7* 8.9*   Inpatient medications: . albuterol  2 puff Inhalation Once  . [START ON 09/10/2020] allopurinol  100 mg Oral Daily  . ascorbic  acid  500 mg Oral Daily  . aspirin  81 mg Oral Daily  . brimonidine  1 drop Both Eyes BID  . Budeson-Glycopyrrol-Formoterol  2 puff Inhalation BID  . cholecalciferol  1,000 Units Oral Daily  . cycloSPORINE  1 drop Both Eyes BID  . dorzolamide  1 drop Both Eyes Daily  . ferrous sulfate  325 mg Oral Q breakfast  . gabapentin  200 mg Oral TID  . heparin injection (subcutaneous)  5,000 Units Subcutaneous Q8H  . hydrocortisone sod succinate (SOLU-CORTEF) inj  50 mg Intravenous Q6H  . latanoprost  1 drop Both Eyes QHS  . mouth rinse  15 mL Mouth Rinse BID  . mycophenolate  180 mg Oral BID  . pantoprazole  40 mg Oral BID  . tacrolimus  2 mg Oral Daily   And  . tacrolimus  1 mg Oral QHS  . valACYclovir  500 mg Oral BID  . verapamil  240 mg Oral Daily  . cyanocobalamin  1,000 mcg Oral Daily   . cefTRIAXone (ROCEPHIN)  IV Stopped (09/08/20 1512)  . metronidazole Stopped (09/09/20 9518)   acetaminophen **OR** acetaminophen, albuterol, ipratropium-albuterol, ondansetron **OR** ondansetron (ZOFRAN) IV, polyethylene glycol

## 2020-09-09 NOTE — Progress Notes (Signed)
PROGRESS NOTE    April [name]  QQP:[id number] DOB: 18-May-1956 DOA: 09/04/2020 PCP: April Caraway, MD   Brief Narrative:   64 y.o. 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  History is obtained from patient, chart, and daughter (somewhat limited from pt as mildly lethargic, though improved as we spoke).  Her symptoms started about 1 week ago.  The pain lasted through the weekend, but on Monday she developed nausea and vomiting in addition to labored breathing.  She was seen at [facility] who recommended she go to [facility].  They diagnosed her with diverticulitis and sent her home with abx and pain meds.  After being home, she was unable to keep things down and continued to have nausea and vomiting.  Home health RN recommended she come to hospital today.  EMS noted glucometer read low.  She had confusion and lethargy.  + fevers to 101.5 on 10/12.  She notes some SOB.  Denies CP.  Notes continued abdominal pain.  Nausea and dry heaving.  Decreased appetite.  Dark urine.  ED Course: Labs, imaging. IVF, abx, IV steroids.  Imaging suggestive of diverticulitis.  Dextrose containing fluids for hypoglycemia.  Admit to hospitalists for sepsis 2/2 diverticulosis with adrenal insufficiency.  Review of Systems: As per HPI otherwise all other systems reviewed and are negative.   Subjective: 10/18 afebrile overnight A/O x4, negative CP, negative postprandial pain.  Some diarrhea with continued full liquid diet.  Believes she can tolerate advancement of diet today.    Assessment & Plan:  Covid vaccination; vaccinated  Active Problems:   Sepsis without acute organ dysfunction ([place])   Diverticulitis   Acute renal failure superimposed on stage 3b chronic kidney disease ([place])  Sepsis -On admission patient met criteria for sepsis  RR> 20, HR> 90, infected system intestines (diverticulitis)  Diverticulitis  -Patient immunocompromised secondary to kidney transplant and being on chronic steroids, and immunosuppressants  -Patient received dose of ceftriaxone+ Flagyl in ED -Change to Zosyn.  Given her immunocompromise state would treat for minimal 7 days. Results for April [name] (MRN [id number]) as of 09/05/2020 07:34  Ref. Range 09/04/2020 14:17 09/04/2020 16:20  Lactic Acid, Venous Latest Ref Range: 0.5 - 1.9 mmol/L 1.0 0.9  -10/14 patient diagnosed with sepsis we will follow procalcitonin to determine antibiotic usage. Results for April [name] (MRN [id number]) as of 09/07/2020 13:28  Ref. Range 09/05/2020 02:50 09/06/2020 04:19 09/07/2020 07:09  Procalcitonin Latest Units: ng/mL 1.58 1.68 1.02  -10/18 advance to bland diet  Acute metabolic encephalopathy -Secondary to sepsis -Resolved   Hypoglycemia  Adrenal Insufficiency: -2/2 acute illness on chronic steroids with prednisone 7.5 mg daily for hx renal transplant.  EMS noted CBG reading low.   -Start stress dose steroids; see hypotension -Q4H blood sugar check  Chronic respiratory failure with hypoxia/tachypnea -On 3 L O2 at home. -Once patient's diverticulitis resolved will obtain ambulatory SPO2  Acute Metabolic Encephalopathy:  -Likely 2/2 sepsis and hypoglycemia.  She's improving, but still lethargic.   -Normal ammonia, pCO2 -TSH WNL, -Vitamin B12 elevated  -Folate WNL -Resolved  Hx Polycystic Kidney Disease s/p renal transplant  Acute on CKD IIIb: (Baseline Cr ~1-1.2)/ATN? Lab Results  Component Value Date   CREATININE 2.58 (H) 09/09/2020   CREATININE 2.55 (H) 09/08/2020   CREATININE 2.18 (H) 09/07/2020   CREATININE 1.84 (H) 09/06/2020   CREATININE 1.24 (H)  09/05/2020  -Basically baseline -Mycophenolate 180 mg BID -Tacrolimus 2 mg daily/1 mg qhs -Valacyclovir 500 mg BID -10/15 CellCept and Tacrolimus level pending -10/17  patient's renal function continues to deteriorate Zosyn may be contributing to patient's continued acute on CKD, therefore will discontinue Zosyn.  Discussed case with pharmacy will change antibiotic to Ceftriaxone + Flagyl believe less toxic combination in this critically ill patient -10/17 discussed case with April [name] nephrology who agreed to see patient requested repeat UA and [place] of transplanted kidney with Doppler pending. -10/18 per nephrology feel patient is still dehydrated restarted normal saline 80m/hr -10/19 per nephrology restart prednisone 7.5 mg daily (home dose)  Abnormal UA:  -Hematuria -Urine culture negative -10/17 repeat urinalysis pending per nephrology request  Chronic diastolic CHF -: echo 50/6301with EF 55-60%, grade II diastolic dysfunction, mildly elevated PASP.   -Hold lasix for now with sepsis.  Follow BNP. -Strict in and out +3.1 L -Daily weight  Filed Weights   09/05/20 2034 09/06/20 0507 09/09/20 0500  Weight: 69.5 kg 69.5 kg 71.5 kg  -10/16 DC normal saline -10/16 Lasix IV 60 mg x 1; believe patient fluid overloaded.  If creatinine does not trend back down will consult nephrology  Hypotension -10/15 Albumin 25 g -Solu-Cortef 50 mg QID (stress dose steroids); >>> 10/18 DC  COPD  Chronic Hypoxic Resp Failure: -Continue home meds  Smoldering multiple myeloma -Follows with Dr. DLorenso Courieroutpatient -Following q3 months outpatient   Gout: -10/18 decrease Allopurinol 100 mg daily secondary to renal function   Hypertension: -Hold imdur, lasix in setting of sepsis.   -Continue verapamil for now.  GERD: PPI  Umbilical Hernia  Left Inguinal Hernia: -Fat containing umbilical hernia as well as L inguinal hernia with 2.5 cm cystic focus  Hypokalemia -Potassium goal> 4 -10/18 K-Dur 50 mEq   Sacral decubitus ulcer stage III  Pressure Injury 05/27/20 Coccyx Mid Stage 3 -  Full thickness tissue loss. Subcutaneous fat may be visible but  bone, tendon or muscle are NOT exposed. (Active)  05/27/20 1918  Location: Coccyx  Location Orientation: Mid  Staging: Stage 3 -  Full thickness tissue loss. Subcutaneous fat may be visible but bone, tendon or muscle are NOT exposed.  Wound Description (Comments):   Present on Admission: Yes     Pressure Injury 05/27/20 Heel Right;Left Stage 1 -  Intact skin with non-blanchable redness of a localized area usually over a bony prominence. (Active)  05/27/20 1918  Location: Heel  Location Orientation: Right;Left  Staging: Stage 1 -  Intact skin with non-blanchable redness of a localized area usually over a bony prominence.  Wound Description (Comments):   Present on Admission: Yes     Pressure Injury 09/04/20 Buttocks Left Stage 2 -  Partial thickness loss of dermis presenting as a shallow open injury with a red, pink wound bed without slough. (Active)  09/04/20 1900  Location: Buttocks  Location Orientation: Left  Staging: Stage 2 -  Partial thickness loss of dermis presenting as a shallow open injury with a red, pink wound bed without slough.  Wound Description (Comments):   Present on Admission: Yes       DVT prophylaxis: Lovenox Code Status: Full Family Communication:  Status is: Inpatient    Dispo: The patient is from: Home              Anticipated d/c is to: Home              Anticipated d/c date is:  10/21              Patient currently unstable      Consultants:  April [name] nephrology   Procedures/Significant Events:  10/17 [place] transplanted kidney with Doppler pending     I have personally reviewed and interpreted all radiology studies and my findings are as above.  VENTILATOR SETTINGS: Room air 10/18 SPO2 96%   Cultures 10/13 blood RIGHT AC NGTD 13 blood RIGHT hand NGTD 10/13 urine negative 10/13 MRSA by PCR negative 10/17 urinalysis pending   Antimicrobials: Anti-infectives (From admission, onward)   Start     Ordered Stop    09/08/20 1400  cefTRIAXone (ROCEPHIN) 2 g in sodium chloride 0.9 % 100 mL IVPB        09/08/20 1221     09/08/20 1400  metroNIDAZOLE (FLAGYL) IVPB 500 mg        09/08/20 1221     09/04/20 2200  valACYclovir (VALTREX) tablet 500 mg        09/04/20 1934     09/04/20 2200  piperacillin-tazobactam (ZOSYN) IVPB 3.375 g  Status:  Discontinued        09/04/20 1837 09/08/20 1220   09/04/20 1330  cefTRIAXone (ROCEPHIN) 2 g in sodium chloride 0.9 % 100 mL IVPB        09/04/20 1329 09/04/20 1445   09/04/20 1330  metroNIDAZOLE (FLAGYL) IVPB 500 mg        09/04/20 1329 09/04/20 1522       Devices    LINES / TUBES:      Continuous Infusions: . cefTRIAXone (ROCEPHIN)  IV Stopped (09/08/20 1512)  . metronidazole Stopped (09/09/20 4098)     Objective: Vitals:   09/08/20 0938 09/08/20 1653 09/08/20 2017 09/09/20 0500  BP: 126/61 (!) 110/57 127/60 110/70  Pulse: 72 80 72 80  Resp: (!) _0 (!) 21  Temp: 97.6 F (36.4 C) 98 F (36.7 C) 98 F (36.7 C) 97.9 F (36.6 C)  TempSrc: Oral Oral  Oral  SpO2: 96% 95% 94%   Weight:    71.5 kg  Height:        Intake/Output Summary (Last 24 hours) at 09/09/2020 1139 Last data filed at 09/09/2020 1191 Gross per 24 hour  Intake 770.84 ml  Output 25 ml  Net 745.84 ml   Filed Weights   09/05/20 2034 09/06/20 0507 09/09/20 0500  Weight: 69.5 kg 69.5 kg 71.5 kg   Physical Exam:  General: A/O x4, No acute respiratory distress Eyes: negative scleral hemorrhage, negative anisocoria, negative icterus ENT: Negative Runny nose, negative gingival bleeding, Neck:  Negative scars, masses, torticollis, lymphadenopathy, JVD Lungs: Clear to auscultation bilaterally without wheezes or crackles Cardiovascular: Regular rate and rhythm without murmur gallop or rub normal S1 and S2 Abdomen: negative abdominal pain, nondistended, positive soft, bowel sounds, no rebound, no ascites, no appreciable mass Extremities: No significant cyanosis, clubbing, or  edema bilateral lower extremities Skin: Negative rashes, lesions, ulcers Psychiatric:  Negative depression, negative anxiety, negative fatigue, negative mania  Central nervous system:  Cranial nerves II through XII intact, tongue/uvula midline, all extremities muscle strength 5/5, sensation intact throughout,negative dysarthria, negative expressive aphasia, negative receptive aphasia.  .     Data Reviewed: Care during the described time interval was provided by me .  I have reviewed this patient's available data, including medical history, events of note, physical examination, and all test results as part of my evaluation.   CBC: Recent Labs  Lab 09/04/20  1417 09/04/20 1417 09/05/20 0250 09/06/20 0419 09/07/20 0709 09/08/20 0707 09/09/20 0507  WBC 18.3*   < > 11.2* 7.1 6.4 3.8* 3.6*  NEUTROABS 16.1*  --   --  6.2 5.3 3.3 3.2  HGB 11.3*   < > 10.0* 8.8* 9.3* 8.7* 8.9*  HCT 36.8   < > 32.7* 28.4* 30.0* 27.9* 28.5*  MCV 101.4*   < > 101.2* 100.7* 101.4* 100.0 99.7  PLT 190   < > 167 153 159 123* 128*   < > = values in this interval not displayed.   Basic Metabolic Panel: Recent Labs  Lab 09/04/20 1417 09/04/20 1418 09/05/20 0250 09/06/20 0419 09/07/20 0709 09/08/20 0707 09/09/20 0507  NA   < >  --  134* 131* 133* 131* 131*  K   < >  --  4.3 4.1 3.1* 3.8 3.3*  CL   < >  --  100 99 100 100 99  CO2   < >  --  21* 21* 19* 18* 19*  GLUCOSE   < >  --  164* 136* 85 111* 128*  BUN   < >  --  33* 52* 61* 71* 66*  CREATININE   < >  --  1.24* 1.84* 2.18* 2.55* 2.58*  CALCIUM   < >  --  9.4 8.7* 9.0 9.2 9.5  MG  --  1.7  --  2.0 2.1 2.1 1.9  PHOS  --   --   --  5.3* 4.4 4.7* 4.2   < > = values in this interval not displayed.   GFR: Estimated Creatinine Clearance: 20.2 mL/min (A) (by C-G formula based on SCr of 2.58 mg/dL (H)). Liver Function Tests: Recent Labs  Lab 09/05/20 0250 09/06/20 0419 09/07/20 0709 09/08/20 0707 09/09/20 0507  AST 18 13* 15 11* 14*  ALT _0 ALKPHOS 59 45 40 35* 39  BILITOT 1.6* 0.9 1.0 1.2 0.7  PROT 7.7 6.7 7.2 7.2 7.2  ALBUMIN 2.8* 2.6* 3.0* 3.0* 3.0*   Recent Labs  Lab 09/02/20 2049  LIPASE 34   Recent Labs  Lab 09/04/20 1418  AMMONIA 18   Coagulation Profile: Recent Labs  Lab 09/04/20 1417  INR 1.5*   Cardiac Enzymes: No results for input(s): CKTOTAL, CKMB, CKMBINDEX, TROPONINI in the last 168 hours. BNP (last 3 results) No results for input(s): PROBNP in the last 8760 hours. HbA1C: No results for input(s): HGBA1C in the last 72 hours. CBG: Recent Labs  Lab 09/08/20 1237 09/08/20 1743 09/08/20 2014 09/08/20 2341 09/09/20 0751  GLUCAP 159* 120* 169* 192* 118*   Lipid Profile: No results for input(s): CHOL, HDL, LDLCALC, TRIG, CHOLHDL, LDLDIRECT in the last 72 hours. Thyroid Function Tests: No results for input(s): TSH, T4TOTAL, FREET4, T3FREE, THYROIDAB in the last 72 hours. Anemia Panel: No results for input(s): VITAMINB12, FOLATE, FERRITIN, TIBC, IRON, RETICCTPCT in the last 72 hours. Urine analysis:    Component Value Date/Time   COLORURINE YELLOW 09/08/2020 1711   APPEARANCEUR CLEAR 09/08/2020 1711   LABSPEC 1.011 09/08/2020 1711   PHURINE 5.0 09/08/2020 1711   GLUCOSEU NEGATIVE 09/08/2020 1711   HGBUR SMALL (A) 09/08/2020 1711   BILIRUBINUR NEGATIVE 09/08/2020 1711   KETONESUR NEGATIVE 09/08/2020 1711   PROTEINUR NEGATIVE 09/08/2020 1711   UROBILINOGEN 0.2 05/15/2013 0733   NITRITE NEGATIVE 09/08/2020 1711   LEUKOCYTESUR NEGATIVE 09/08/2020 1711   Sepsis Labs: _1 (procalcitonin:4,lacticidven:4)  ) Recent Results (from the past 240 hour(s))  Respiratory Panel  by RT PCR (Flu A&B, Covid) - Nasopharyngeal Swab     Status: None   Collection Time: 09/04/20  2:14 PM   Specimen: Nasopharyngeal Swab  Result Value Ref Range Status   SARS Coronavirus 2 by RT PCR NEGATIVE NEGATIVE Final    Comment: (NOTE) SARS-CoV-2 target nucleic acids are NOT DETECTED.  The SARS-CoV-2 RNA  is generally detectable in upper respiratoy specimens during the acute phase of infection. The lowest concentration of SARS-CoV-2 viral copies this assay can detect is 131 copies/mL. A negative result does not preclude SARS-Cov-2 infection and should not be used as the sole basis for treatment or other patient management decisions. A negative result may occur with  improper specimen collection/handling, submission of specimen other than nasopharyngeal swab, presence of viral mutation(s) within the areas targeted by this assay, and inadequate number of viral copies (<131 copies/mL). A negative result must be combined with clinical observations, patient history, and epidemiological information. The expected result is Negative.  Fact Sheet for Patients:  PinkCheek.be  Fact Sheet for Healthcare Providers:  GravelBags.it  This test is no t yet approved or cleared by the [place] FDA and  has been authorized for detection and/or diagnosis of SARS-CoV-2 by FDA under an Emergency Use Authorization (EUA). This EUA will remain  in effect (meaning this test can be used) for the duration of the COVID-19 declaration under Section 564(b)(1) of the Act, 21 U.S.C. section 360bbb-3(b)(1), unless the authorization is terminated or revoked sooner.     Influenza A by PCR NEGATIVE NEGATIVE Final   Influenza B by PCR NEGATIVE NEGATIVE Final    Comment: (NOTE) The Xpert Xpress SARS-CoV-2/FLU/RSV assay is intended as an aid in  the diagnosis of influenza from Nasopharyngeal swab specimens and  should not be used as a sole basis for treatment. Nasal washings and  aspirates are unacceptable for Xpert Xpress SARS-CoV-2/FLU/RSV  testing.  Fact Sheet for Patients: PinkCheek.be  Fact Sheet for Healthcare Providers: GravelBags.it  This test is not yet approved or cleared by the [place] FDA and  has been authorized for detection and/or diagnosis of SARS-CoV-2 by  FDA under an Emergency Use Authorization (EUA). This EUA will remain  in effect (meaning this test can be used) for the duration of the  Covid-19 declaration under Section 564(b)(1) of the Act, 21  U.S.C. section 360bbb-3(b)(1), unless the authorization is  terminated or revoked. Performed at [facility], [place] [address] [place], [place] [address]   Blood Culture (routine x 2)     Status: None   Collection Time: 09/04/20  2:18 PM   Specimen: BLOOD  Result Value Ref Range Status   Specimen Description   Final    BLOOD RIGHT ANTECUBITAL Performed at [facility] [address] [place], [place] [address]    Special Requests   Final    BOTTLES DRAWN AEROBIC AND ANAEROBIC Blood Culture adequate volume Performed at [place] [address] [place], [place] [address]    Culture   Final    NO GROWTH 5 DAYS Performed at [facility], [place] [address], [place], [place] [address]    Report Status 09/09/2020 FINAL  Final  Urine culture     Status: None   Collection Time: 09/04/20  3:27 PM   Specimen: In/Out Cath Urine  Result Value Ref Range Status   Specimen Description   Final    IN/OUT CATH URINE Performed at [facility],  [place] [address], [place], [place] [address]    Special Requests   Final    NONE Performed at [facility], [place] [address] [place], [place] [address]    Culture   Final    NO GROWTH Performed at [facility], [place] [address] [place], [place] [address]    Report Status 09/05/2020 FINAL  Final  MRSA PCR Screening     Status: None   Collection Time: 09/04/20  6:11 PM   Specimen: Nasal Mucosa; Nasopharyngeal  Result Value Ref Range Status   MRSA by PCR NEGATIVE NEGATIVE Final    Comment:        The GeneXpert MRSA Assay (FDA approved for NASAL specimens only), is one  component of a comprehensive MRSA colonization surveillance program. It is not intended to diagnose MRSA infection nor to guide or monitor treatment for MRSA infections. Performed at [facility], [place] [address], [place], [place] [address]   Blood Culture (routine x 2)     Status: None   Collection Time: 09/04/20  7:12 PM   Specimen: BLOOD RIGHT HAND  Result Value Ref Range Status   Specimen Description   Final    BLOOD RIGHT HAND Performed at [facility] [address], [place], [place] [address]    Special Requests   Final    BOTTLES DRAWN AEROBIC AND ANAEROBIC Blood Culture adequate volume Performed at [place] [address], [place], [place] [address]    Culture   Final    NO GROWTH 5 DAYS Performed at [facility], [place] [address], [place], [place] [address]    Report Status 09/09/2020 FINAL  Final         Radiology Studies: US Renal Transplant w/Doppler  Result Date: 09/08/2020 CLINICAL DATA:  64 year old female with acute renal insufficiency. Renal transplant in 2013. EXAM: ULTRASOUND OF RENAL TRANSPLANT WITH RENAL DOPPLER ULTRASOUND TECHNIQUE: Ultrasound examination of the renal transplant was performed with gray-scale, color and duplex doppler evaluation. COMPARISON:  None. FINDINGS: Transplant kidney location: Right lower quadrant Transplant Kidney: Renal measurements: 11.5 x 5.5 x 7.0 cm = volume: 227m. Normal in size and parenchymal echogenicity. No evidence of mass or hydronephrosis. No peri-transplant fluid collection seen. Color flow in the main renal artery:  Yes Color flow in the main renal vein:  Yes Duplex Doppler Evaluation: Main Renal Artery Velocity: 162 cm/sec Main Renal Artery Resistive Index: 0.83 Venous waveform in main renal vein:  Present Intrarenal resistive index in upper pole:  0.79 (normal 0.6-0.8; equivocal 0.8-0.9; abnormal >= 0.9) Intrarenal resistive index in lower pole: 0.76  (normal 0.6-0.8; equivocal 0.8-0.9; abnormal >= 0.9) Bladder: Normal for degree of bladder distention. Other findings:  None. IMPRESSION: 1. No hydronephrosis or nephrolithiasis of the renal transplant. No peritransplant fluid collection. 2. Renal transplant resistive indices within upper limits of normal range. Electronically Signed   By: AAnner CreteM.D.   On: 09/08/2020 20:45        Scheduled Meds: . albuterol  2 puff Inhalation Once  . [START ON 09/10/2020] allopurinol  100 mg Oral Daily  . ascorbic acid  500 mg Oral Daily  . aspirin  81 mg Oral Daily  . brimonidine  1 drop Both Eyes BID  . Budeson-Glycopyrrol-Formoterol  2 puff Inhalation BID  . cholecalciferol  1,000 Units Oral Daily  . cycloSPORINE  1 drop Both Eyes BID  . dorzolamide  1 drop Both Eyes Daily  .  ferrous sulfate  325 mg Oral Q breakfast  . gabapentin  200 mg Oral TID  . heparin injection (subcutaneous)  5,000 Units Subcutaneous Q8H  . hydrocortisone sod succinate (SOLU-CORTEF) inj  50 mg Intravenous Q6H  . latanoprost  1 drop Both Eyes QHS  . mouth rinse  15 mL Mouth Rinse BID  . mycophenolate  180 mg Oral BID  . pantoprazole  40 mg Oral BID  . tacrolimus  2 mg Oral Daily   And  . tacrolimus  1 mg Oral QHS  . valACYclovir  500 mg Oral BID  . verapamil  240 mg Oral Daily  . cyanocobalamin  1,000 mcg Oral Daily   Continuous Infusions: . cefTRIAXone (ROCEPHIN)  IV Stopped (09/08/20 1512)  . metronidazole Stopped (09/09/20 8756)     LOS: 5 days   The patient is critically ill with multiple organ systems failure and requires high complexity decision making for assessment and support, frequent evaluation and titration of therapies, application of advanced monitoring technologies and extensive interpretation of multiple databases. Critical Care Time devoted to patient care services described in this note  Time spent: 40 minutes     Teressa Mcglocklin, Geraldo Docker, MD [facility] Pager [phone]  If  7PM-7AM, please contact night-coverage [email or web address] Password [facility] 09/09/2020, 11:39 AM

## 2020-09-09 NOTE — Care Management Important Message (Signed)
Important Message  Patient Details IM Letter given to the Patient Name: April [name] MRN: [id number] Date of Birth: [birth year]   Medicare Important Message Given:  Yes     Kerin Salen 09/09/2020, 11:00 AM

## 2020-09-09 NOTE — Progress Notes (Signed)
PT states she has taken home med April [name]) this AM.

## 2020-09-10 DIAGNOSIS — R197 Diarrhea, unspecified: Secondary | ICD-10-CM | POA: Diagnosis present

## 2020-09-10 LAB — GLUCOSE, CAPILLARY
Glucose-Capillary: 192 mg/dL — ABNORMAL HIGH (ref 70–99)
Glucose-Capillary: 174 mg/dL — ABNORMAL HIGH (ref 70–99)
Glucose-Capillary: 199 mg/dL — ABNORMAL HIGH (ref 70–99)
Glucose-Capillary: 258 mg/dL — ABNORMAL HIGH (ref 70–99)
Glucose-Capillary: 177 mg/dL — ABNORMAL HIGH (ref 70–99)
Glucose-Capillary: 106 mg/dL — ABNORMAL HIGH (ref 70–99)

## 2020-09-10 LAB — CBC WITH DIFFERENTIAL/PLATELET
Abs Immature Granulocytes: 0.07 10*3/uL (ref 0.00–0.07)
Basophils Absolute: 0 10*3/uL (ref 0.0–0.1)
Basophils Relative: 0 %
Eosinophils Absolute: 0 10*3/uL (ref 0.0–0.5)
Eosinophils Relative: 0 %
HCT: 30.5 % — ABNORMAL LOW (ref 36.0–46.0)
Hemoglobin: 9.3 g/dL — ABNORMAL LOW (ref 12.0–15.0)
Immature Granulocytes: 2 %
Lymphocytes Relative: 8 %
Lymphs Abs: 0.3 10*3/uL — ABNORMAL LOW (ref 0.7–4.0)
MCH: 30.9 pg (ref 26.0–34.0)
MCHC: 30.5 g/dL (ref 30.0–36.0)
MCV: 101.3 fL — ABNORMAL HIGH (ref 80.0–100.0)
Monocytes Absolute: 0.1 10*3/uL (ref 0.1–1.0)
Monocytes Relative: 4 %
Neutro Abs: 2.9 10*3/uL (ref 1.7–7.7)
Neutrophils Relative %: 86 %
Platelets: 135 10*3/uL — ABNORMAL LOW (ref 150–400)
RBC: 3.01 MIL/uL — ABNORMAL LOW (ref 3.87–5.11)
RDW: 18.3 % — ABNORMAL HIGH (ref 11.5–15.5)
WBC: 3.4 10*3/uL — ABNORMAL LOW (ref 4.0–10.5)
nRBC: 0 % (ref 0.0–0.2)

## 2020-09-10 LAB — RENAL FUNCTION PANEL
Albumin: 2.8 g/dL — ABNORMAL LOW (ref 3.5–5.0)
Anion gap: 14 (ref 5–15)
BUN: 61 mg/dL — ABNORMAL HIGH (ref 8–23)
CO2: 17 mmol/L — ABNORMAL LOW (ref 22–32)
Calcium: 9.5 mg/dL (ref 8.9–10.3)
Chloride: 101 mmol/L (ref 98–111)
Creatinine, Ser: 2.34 mg/dL — ABNORMAL HIGH (ref 0.44–1.00)
GFR, Estimated: 21 mL/min — ABNORMAL LOW (ref >60)
Glucose, Bld: 187 mg/dL — ABNORMAL HIGH (ref 70–99)
Phosphorus: 3.3 mg/dL (ref 2.5–4.6)
Potassium: 3.7 mmol/L (ref 3.5–5.1)
Sodium: 132 mmol/L — ABNORMAL LOW (ref 135–145)

## 2020-09-10 LAB — COMPREHENSIVE METABOLIC PANEL
ALT: 8 U/L (ref 0–44)
AST: 14 U/L — ABNORMAL LOW (ref 15–41)
Albumin: 3.1 g/dL — ABNORMAL LOW (ref 3.5–5.0)
Alkaline Phosphatase: 46 U/L (ref 38–126)
Anion gap: 10 (ref 5–15)
BUN: 66 mg/dL — ABNORMAL HIGH (ref 8–23)
CO2: 18 mmol/L — ABNORMAL LOW (ref 22–32)
Calcium: 9.3 mg/dL (ref 8.9–10.3)
Chloride: 103 mmol/L (ref 98–111)
Creatinine, Ser: 2.23 mg/dL — ABNORMAL HIGH (ref 0.44–1.00)
GFR, Estimated: 23 mL/min — ABNORMAL LOW (ref >60)
Glucose, Bld: 193 mg/dL — ABNORMAL HIGH (ref 70–99)
Potassium: 3.7 mmol/L (ref 3.5–5.1)
Sodium: 131 mmol/L — ABNORMAL LOW (ref 135–145)
Total Bilirubin: 0.5 mg/dL (ref 0.3–1.2)
Total Protein: 7 g/dL (ref 6.5–8.1)

## 2020-09-10 LAB — HEMOGLOBIN AND HEMATOCRIT, BLOOD
HCT: 30.4 % — ABNORMAL LOW (ref 36.0–46.0)
HCT: 29.8 % — ABNORMAL LOW (ref 36.0–46.0)
HCT: 29.7 % — ABNORMAL LOW (ref 36.0–46.0)
Hemoglobin: 9.5 g/dL — ABNORMAL LOW (ref 12.0–15.0)
Hemoglobin: 9.2 g/dL — ABNORMAL LOW (ref 12.0–15.0)
Hemoglobin: 9.1 g/dL — ABNORMAL LOW (ref 12.0–15.0)

## 2020-09-10 LAB — MAGNESIUM: Magnesium: 1.9 mg/dL (ref 1.7–2.4)

## 2020-09-10 LAB — PHOSPHORUS: Phosphorus: 3.4 mg/dL (ref 2.5–4.6)

## 2020-09-10 MED ORDER — SODIUM CHLORIDE 0.9 % IV SOLN: INTRAVENOUS | Status: DC

## 2020-09-10 MED ORDER — INSULIN ASPART 100 UNIT/ML ~~LOC~~ SOLN
0.0000 [IU] | SUBCUTANEOUS | Status: DC
  Administered 2020-09-10: 8 [IU] via SUBCUTANEOUS
  Administered 2020-09-10: 3 [IU] via SUBCUTANEOUS
  Administered 2020-09-11: 5 [IU] via SUBCUTANEOUS
  Administered 2020-09-11: 2 [IU] via SUBCUTANEOUS
  Administered 2020-09-11 (×2): 3 [IU] via SUBCUTANEOUS
  Administered 2020-09-12: 5 [IU] via SUBCUTANEOUS
  Administered 2020-09-12: 2 [IU] via SUBCUTANEOUS
  Administered 2020-09-12: 3 [IU] via SUBCUTANEOUS

## 2020-09-10 MED ORDER — SODIUM CHLORIDE 0.9 % IV SOLN: INTRAVENOUS | Status: DC | PRN

## 2020-09-10 NOTE — Progress Notes (Signed)
Patient had a very large amount of bright red blood in her stool.  Patient not c/o of abdominal pain or dizziness. MD made aware.  Will continue to monitor closely.

## 2020-09-10 NOTE — Progress Notes (Signed)
PT states she has not taken Breztri (home med) at this time but does intend to take this morning- RN aware.

## 2020-09-10 NOTE — Progress Notes (Signed)
PROGRESS NOTE    April [name]  OIL:[id number] DOB: [birth year] DOA: 09/04/2020 PCP: [name], MD   Brief Narrative:   64 y.o. 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  History is obtained from patient, chart, and daughter (somewhat limited from pt as mildly lethargic, though improved as we spoke).  Her symptoms started about 1 week ago.  The pain lasted through the weekend, but on Monday she developed nausea and vomiting in addition to labored breathing.  She was seen at [facility] who recommended she go to [facility].  They diagnosed her with diverticulitis and sent her home with abx and pain meds.  After being home, she was unable to keep things down and continued to have nausea and vomiting.  Home health RN recommended she come to hospital today.  EMS noted glucometer read low.  She had confusion and lethargy.  + fevers to 101.5 on 10/12.  She notes some SOB.  Denies CP.  Notes continued abdominal pain.  Nausea and dry heaving.  Decreased appetite.  Dark urine.  ED Course: Labs, imaging. IVF, abx, IV steroids.  Imaging suggestive of diverticulitis.  Dextrose containing fluids for hypoglycemia.  Admit to hospitalists for sepsis 2/2 diverticulosis with adrenal insufficiency.  Review of Systems: As per HPI otherwise all other systems reviewed and are negative.   Subjective: 10/19 afebrile overnight, A/O x4, negative postprandial pain on bland diet.  States continues to have diarrhea described as watery. BRBPR with BM today      Assessment & Plan:  Covid vaccination; vaccinated  Active Problems:   Sepsis without acute organ dysfunction ([place])   Diverticulitis   Acute renal failure superimposed on stage 3b chronic kidney disease ([place])  Sepsis -On admission patient met criteria for sepsis RR> 20, HR> 90,  infected system intestines (diverticulitis)  Diverticulitis  -Patient immunocompromised secondary to kidney transplant and being on chronic steroids, and immunosuppressants  -Patient received dose of ceftriaxone+ Flagyl in ED -Change to Zosyn.  Given her immunocompromise state would treat for minimal 7 days. Results for April [name], April [name] (MRN [id number]) as of 09/05/2020 07:34  Ref. Range 09/04/2020 14:17 09/04/2020 16:20  Lactic Acid, Venous Latest Ref Range: 0.5 - 1.9 mmol/L 1.0 0.9  -10/14 patient diagnosed with sepsis we will follow procalcitonin to determine antibiotic usage. Results for April [name], April [name] (MRN [id number]) as of 09/07/2020 13:28  Ref. Range 09/05/2020 02:50 09/06/2020 04:19 09/07/2020 07:09  Procalcitonin Latest Units: ng/mL 1.58 1.68 1.02  -10/19 advance diet Renal diet  Acute metabolic encephalopathy -Secondary to sepsis -Resolved   Hypoglycemia  Adrenal Insufficiency/Hyperglycemia: -2/2 acute illness on chronic steroids with prednisone 7.5 mg daily for hx renal transplant.  EMS noted CBG reading low.   -1019 DC'd stress dose steroids;  -Moderate SSI  Chronic respiratory failure with hypoxia/tachypnea -On 3 L O2 at home. -Once patient's diverticulitis resolved will obtain ambulatory SPO2  Acute Metabolic Encephalopathy:  -Likely 2/2 sepsis and hypoglycemia.  She's improving, but still lethargic.   -Normal ammonia, pCO2 -TSH WNL, -Vitamin B12 elevated  -Folate WNL -Resolved  Hx Polycystic Kidney Disease s/p renal transplant  Acute on CKD IIIb: (Baseline Cr ~1-1.2)/ATN? Lab Results  Component Value Date   CREATININE 2.34 (H) 09/10/2020   CREATININE 2.23 (H) 09/10/2020   CREATININE 2.58 (H) 09/09/2020   CREATININE 2.55 (H) 09/08/2020   CREATININE 2.18 (H) 09/07/2020  -Mycophenolate  180 mg BID -Tacrolimus 2 mg daily/1 mg qhs -Valacyclovir 500 mg BID -10/15 CellCept and Tacrolimus level pending -10/17 patient's renal function continues to  deteriorate Zosyn may be contributing to patient's continued acute on CKD, therefore will discontinue Zosyn.  Discussed case with pharmacy will change antibiotic to Ceftriaxone + Flagyl believe less toxic combination in this critically ill patient -10/17 discussed case with Dr. Rennis Petty nephrology who agreed to see patient requested repeat UA and [place] of transplanted kidney with Doppler pending. -10/18 per nephrology feel patient is still dehydrated restarted normal saline 41m/hr -10/19 per nephrology restart prednisone 7.5 mg daily (home dose) -10/19 appears to be improving  Abnormal UA:  -Hematuria -Urine culture negative -10/17 repeat urinalysis improved  Chronic diastolic CHF -: echo 59/4854with EF 55-60%, grade II diastolic dysfunction, mildly elevated PASP.   -Hold lasix for now with sepsis.  Follow BNP. -Strict in and out +4.8 L -Daily weight  Filed Weights   09/06/20 0507 09/09/20 0500 09/10/20 0426  Weight: 69.5 kg 71.5 kg 71.6 kg  -10/16 Lasix IV 60 mg x 1; believe patient fluid overloaded.  If creatinine does not trend back down will consult nephrology -Gently hydrating see acute on CKD stage IIIb  Hypotension -10/15 Albumin 25 g -Solu-Cortef 50 mg QID (stress dose steroids); >>> 10/19 DC  COPD  Chronic Hypoxic Resp Failure: -Continue home meds  Smoldering multiple myeloma -Follows with Dr. DLorenso Courieroutpatient -Following q3 months outpatient   Gout: -10/18 decrease Allopurinol 100 mg daily secondary to renal function   Hypertension: -Hold imdur, lasix in setting of sepsis.   -Continue verapamil for now.  GERD: PPI  Umbilical Hernia  Left Inguinal Hernia: -Fat containing umbilical hernia as well as L inguinal hernia with 2.5 cm cystic focus  Hypokalemia -Potassium goal> 4 -10/18 K-Dur 50 mEq   Sacral decubitus ulcer stage III  Pressure Injury 05/27/20 Coccyx Mid Stage 3 -  Full thickness tissue loss. Subcutaneous fat may be visible but bone,  tendon or muscle are NOT exposed. (Active)  05/27/20 1918  Location: Coccyx  Location Orientation: Mid  Staging: Stage 3 -  Full thickness tissue loss. Subcutaneous fat may be visible but bone, tendon or muscle are NOT exposed.  Wound Description (Comments):   Present on Admission: Yes     Pressure Injury 05/27/20 Heel Right;Left Stage 1 -  Intact skin with non-blanchable redness of a localized area usually over a bony prominence. (Active)  05/27/20 1918  Location: Heel  Location Orientation: Right;Left  Staging: Stage 1 -  Intact skin with non-blanchable redness of a localized area usually over a bony prominence.  Wound Description (Comments):   Present on Admission: Yes     Pressure Injury 09/04/20 Buttocks Left Stage 2 -  Partial thickness loss of dermis presenting as a shallow open injury with a red, pink wound bed without slough. (Active)  09/04/20 1900  Location: Buttocks  Location Orientation: Left  Staging: Stage 2 -  Partial thickness loss of dermis presenting as a shallow open injury with a red, pink wound bed without slough.  Wound Description (Comments):   Present on Admission: Yes   Acute blood loss anemia? -10/19 RN noticed BRBPR in her stool -Trend hemoglobin QID Lab Results  Component Value Date   HGB 9.5 (L) 09/10/2020   HGB 9.3 (L) 09/10/2020   HGB 8.9 (L) 09/09/2020   HGB 8.7 (L) 09/08/2020   HGB 9.3 (L) 09/07/2020  -Currently stable -On Protonix 40 mg BID -10/19  aspirin 81 mg daily (hold) -10/19 subcu heparin (hold)   Diarrhea -Watery, hopefully only secondary to patient beginning to eat with just recovering from diverticulitis -10/19 MiraLAX discontinue. -10/19 counseled patient that if diarrhea continues in the a.m. will have to culture for C. difficile secondary to her being on antibiotics for the last week.   DVT prophylaxis: Subcu heparin (hold) Code Status: Full Family Communication:  Status is: Inpatient    Dispo: The patient is from:  Home              Anticipated d/c is to: Home              Anticipated d/c date is: 10/21              Patient currently unstable      Consultants:  Dr. Rennis Petty nephrology   Procedures/Significant Events:  10/17 [place] transplanted kidney with Doppler pending     I have personally reviewed and interpreted all radiology studies and my findings are as above.  VENTILATOR SETTINGS: Room air 10/19 SPO2 96%   Cultures 10/13 blood RIGHT AC NGTD 13 blood RIGHT hand NGTD 10/13 urine negative 10/13 MRSA by PCR negative 10/17 urinalysis pending   Antimicrobials: Anti-infectives (From admission, onward)   Start     Ordered Stop   09/08/20 1400  cefTRIAXone (ROCEPHIN) 2 g in sodium chloride 0.9 % 100 mL IVPB        09/08/20 1221     09/08/20 1400  metroNIDAZOLE (FLAGYL) IVPB 500 mg        09/08/20 1221     09/04/20 2200  valACYclovir (VALTREX) tablet 500 mg        09/04/20 1934     09/04/20 2200  piperacillin-tazobactam (ZOSYN) IVPB 3.375 g  Status:  Discontinued        09/04/20 1837 09/08/20 1220   09/04/20 1330  cefTRIAXone (ROCEPHIN) 2 g in sodium chloride 0.9 % 100 mL IVPB        09/04/20 1329 09/04/20 1445   09/04/20 1330  metroNIDAZOLE (FLAGYL) IVPB 500 mg        09/04/20 1329 09/04/20 1522       Devices    LINES / TUBES:      Continuous Infusions: . cefTRIAXone (ROCEPHIN)  IV 2 g (09/09/20 1318)  . metronidazole 500 mg (09/10/20 0526)     Objective: Vitals:   09/10/20 0356 09/10/20 0426 09/10/20 0956 09/10/20 1245  BP: 126/66  (!) 153/54 (!) 145/78  Pulse: 75   83  Resp: 18  (!) 22 16  Temp: 97.9 F (36.6 C)  97.6 F (36.4 C) 97.7 F (36.5 C)  TempSrc: Oral  Oral Oral  SpO2: 93%  95% 97%  Weight:  71.6 kg    Height:        Intake/Output Summary (Last 24 hours) at 09/10/2020 1259 Last data filed at 09/10/2020 0900 Gross per 24 hour  Intake 1622.84 ml  Output --  Net 1622.84 ml   Filed Weights   09/06/20 0507 09/09/20 0500 09/10/20  0426  Weight: 69.5 kg 71.5 kg 71.6 kg   Physical Exam:  General: A/O x4, No acute respiratory distress Eyes: negative scleral hemorrhage, negative anisocoria, negative icterus ENT: Negative Runny nose, negative gingival bleeding, Neck:  Negative scars, masses, torticollis, lymphadenopathy, JVD Lungs: Clear to auscultation bilaterally without wheezes or crackles Cardiovascular: Regular rate and rhythm without murmur gallop or rub normal S1 and S2 Abdomen: negative abdominal pain, nondistended, positive soft,  bowel sounds, no rebound, no ascites, no appreciable mass Extremities: No significant cyanosis, clubbing, or edema bilateral lower extremities Skin: Negative rashes, lesions, ulcers Psychiatric:  Negative depression, negative anxiety, negative fatigue, negative mania  Central nervous system:  Cranial nerves II through XII intact, tongue/uvula midline, all extremities muscle strength 5/5, sensation intact throughout,negative dysarthria, negative expressive aphasia, negative receptive aphasia.  .     Data Reviewed: Care during the described time interval was provided by me .  I have reviewed this patient's available data, including medical history, events of note, physical examination, and all test results as part of my evaluation.   CBC: Recent Labs  Lab 09/06/20 0419 09/06/20 0419 09/07/20 0709 09/08/20 0707 09/09/20 0507 09/10/20 0438 09/10/20 1004  WBC 7.1  --  6.4 3.8* 3.6* 3.4*  --   NEUTROABS 6.2  --  5.3 3.3 3.2 2.9  --   HGB 8.8*   < > 9.3* 8.7* 8.9* 9.3* 9.5*  HCT 28.4*   < > 30.0* 27.9* 28.5* 30.5* 30.4*  MCV 100.7*  --  101.4* 100.0 99.7 101.3*  --   PLT 153  --  159 123* 128* 135*  --    < > = values in this interval not displayed.   Basic Metabolic Panel: Recent Labs  Lab 09/06/20 0419 09/07/20 0709 09/08/20 0707 09/09/20 0507 09/10/20 0438  NA 131* 133* 131* 131* 132*  131*  K 4.1 3.1* 3.8 3.3* 3.7  3.7  CL 99 100 100 99 101  103  CO2 21* 19* 18*  19* 17*  18*  GLUCOSE 136* 85 111* 128* 187*  193*  BUN 52* 61* 71* 66* 61*  66*  CREATININE 1.84* 2.18* 2.55* 2.58* 2.34*  2.23*  CALCIUM 8.7* 9.0 9.2 9.5 9.5  9.3  MG 2.0 2.1 2.1 1.9 1.9  PHOS 5.3* 4.4 4.7* 4.2 3.3  3.4   GFR: Estimated Creatinine Clearance: 22.3 mL/min (A) (by C-G formula based on SCr of 2.34 mg/dL (H)). Liver Function Tests: Recent Labs  Lab 09/06/20 0419 09/07/20 0709 09/08/20 0707 09/09/20 0507 09/10/20 0438  AST 13* 15 11* 14* 14*  ALT 8 8 9 9 8   ALKPHOS 45 40 35* 39 46  BILITOT 0.9 1.0 1.2 0.7 0.5  PROT 6.7 7.2 7.2 7.2 7.0  ALBUMIN 2.6* 3.0* 3.0* 3.0* 2.8*  3.1*   No results for input(s): LIPASE, AMYLASE in the last 168 hours. Recent Labs  Lab 09/04/20 1418  AMMONIA 18   Coagulation Profile: Recent Labs  Lab 09/04/20 1417  INR 1.5*   Cardiac Enzymes: No results for input(s): CKTOTAL, CKMB, CKMBINDEX, TROPONINI in the last 168 hours. BNP (last 3 results) No results for input(s): PROBNP in the last 8760 hours. HbA1C: No results for input(s): HGBA1C in the last 72 hours. CBG: Recent Labs  Lab 09/09/20 2017 09/09/20 2351 09/10/20 0353 09/10/20 0804 09/10/20 1204  GLUCAP 203* 233* 192* 174* 199*   Lipid Profile: No results for input(s): CHOL, HDL, LDLCALC, TRIG, CHOLHDL, LDLDIRECT in the last 72 hours. Thyroid Function Tests: No results for input(s): TSH, T4TOTAL, FREET4, T3FREE, THYROIDAB in the last 72 hours. Anemia Panel: No results for input(s): VITAMINB12, FOLATE, FERRITIN, TIBC, IRON, RETICCTPCT in the last 72 hours. Urine analysis:    Component Value Date/Time   COLORURINE YELLOW 09/08/2020 1711   APPEARANCEUR CLEAR 09/08/2020 1711   LABSPEC 1.011 09/08/2020 1711   PHURINE 5.0 09/08/2020 1711   GLUCOSEU NEGATIVE 09/08/2020 1711   HGBUR SMALL (A) 09/08/2020 1711  BILIRUBINUR NEGATIVE 09/08/2020 1711   KETONESUR NEGATIVE 09/08/2020 1711   PROTEINUR NEGATIVE 09/08/2020 1711   UROBILINOGEN 0.2 05/15/2013 0733    NITRITE NEGATIVE 09/08/2020 1711   LEUKOCYTESUR NEGATIVE 09/08/2020 1711   Sepsis Labs: @LABRCNTIP (procalcitonin:4,lacticidven:4)  ) Recent Results (from the past 240 hour(s))  Respiratory Panel by RT PCR (Flu A&B, Covid) - Nasopharyngeal Swab     Status: None   Collection Time: 09/04/20  2:14 PM   Specimen: Nasopharyngeal Swab  Result Value Ref Range Status   SARS Coronavirus 2 by RT PCR NEGATIVE NEGATIVE Final    Comment: (NOTE) SARS-CoV-2 target nucleic acids are NOT DETECTED.  The SARS-CoV-2 RNA is generally detectable in upper respiratoy specimens during the acute phase of infection. The lowest concentration of SARS-CoV-2 viral copies this assay can detect is 131 copies/mL. A negative result does not preclude SARS-Cov-2 infection and should not be used as the sole basis for treatment or other patient management decisions. A negative result may occur with  improper specimen collection/handling, submission of specimen other than nasopharyngeal swab, presence of viral mutation(s) within the areas targeted by this assay, and inadequate number of viral copies (<131 copies/mL). A negative result must be combined with clinical observations, patient history, and epidemiological information. The expected result is Negative.  Fact Sheet for Patients:  PinkCheek.be  Fact Sheet for Healthcare Providers:  GravelBags.it  This test is no t yet approved or cleared by the [place] FDA and  has been authorized for detection and/or diagnosis of SARS-CoV-2 by FDA under an Emergency Use Authorization (EUA). This EUA will remain  in effect (meaning this test can be used) for the duration of the COVID-19 declaration under Section 564(b)(1) of the Act, 21 U.S.C. section 360bbb-3(b)(1), unless the authorization is terminated or revoked sooner.     Influenza A by PCR NEGATIVE NEGATIVE Final   Influenza B by PCR NEGATIVE NEGATIVE  Final    Comment: (NOTE) The Xpert Xpress SARS-CoV-2/FLU/RSV assay is intended as an aid in  the diagnosis of influenza from Nasopharyngeal swab specimens and  should not be used as a sole basis for treatment. Nasal washings and  aspirates are unacceptable for Xpert Xpress SARS-CoV-2/FLU/RSV  testing.  Fact Sheet for Patients: PinkCheek.be  Fact Sheet for Healthcare Providers: GravelBags.it  This test is not yet approved or cleared by the [place] FDA and  has been authorized for detection and/or diagnosis of SARS-CoV-2 by  FDA under an Emergency Use Authorization (EUA). This EUA will remain  in effect (meaning this test can be used) for the duration of the  Covid-19 declaration under Section 564(b)(1) of the Act, 21  U.S.C. section 360bbb-3(b)(1), unless the authorization is  terminated or revoked. Performed at [facility], [place] [address] [place], [place] [address]   Blood Culture (routine x 2)     Status: None   Collection Time: 09/04/20  2:18 PM   Specimen: BLOOD  Result Value Ref Range Status   Specimen Description   Final    BLOOD RIGHT ANTECUBITAL Performed at [facility] [address], [place], [place] [address]    Special Requests   Final    BOTTLES DRAWN AEROBIC AND ANAEROBIC Blood Culture adequate volume Performed at [facility] [address], [place], [place] [address]    Culture   Final    NO GROWTH 5 DAYS Performed at [facility], [place] [address] [place], [place] [address]    Report Status 09/09/2020  FINAL  Final  Urine culture     Status: None   Collection Time: 09/04/20  3:27 PM   Specimen: In/Out Cath Urine  Result Value Ref Range Status   Specimen Description   Final    IN/OUT CATH URINE Performed at [facility] [address], [place], [place] [address]    Special Requests   Final     NONE Performed at [facility], [place] [address], [place], [place] [address]    Culture   Final    NO GROWTH Performed at [facility], [place] [address], [place], [place] [address]    Report Status 09/05/2020 FINAL  Final  MRSA PCR Screening     Status: None   Collection Time: 09/04/20  6:11 PM   Specimen: Nasal Mucosa; Nasopharyngeal  Result Value Ref Range Status   MRSA by PCR NEGATIVE NEGATIVE Final    Comment:        The GeneXpert MRSA Assay (FDA approved for NASAL specimens only), is one component of a comprehensive MRSA colonization surveillance program. It is not intended to diagnose MRSA infection nor to guide or monitor treatment for MRSA infections. Performed at [facility], [place] [address] [place], [place] [address]   Blood Culture (routine x 2)     Status: None   Collection Time: 09/04/20  7:12 PM   Specimen: BLOOD RIGHT HAND  Result Value Ref Range Status   Specimen Description   Final    BLOOD RIGHT HAND Performed at [facility] [address], [place], [place] [address]    Special Requests   Final    BOTTLES DRAWN AEROBIC AND ANAEROBIC Blood Culture adequate volume Performed at [facility] [address], [place], [place] [address]    Culture   Final    NO GROWTH 5 DAYS Performed at [facility], [place] [address] [place], [place] [address]    Report Status 09/09/2020 FINAL  Final         Radiology Studies: US Renal Transplant w/Doppler  Result Date: 09/08/2020 CLINICAL DATA:  64 year old female with acute renal insufficiency. Renal transplant in 2013. EXAM: ULTRASOUND OF RENAL TRANSPLANT WITH RENAL DOPPLER ULTRASOUND TECHNIQUE: Ultrasound examination of the renal transplant was performed with gray-scale, color and duplex doppler evaluation. COMPARISON:  None. FINDINGS: Transplant kidney location: Right lower quadrant Transplant Kidney: Renal measurements: 11.5 x  5.5 x 7.0 cm = volume: 240m. Normal in size and parenchymal echogenicity. No evidence of mass or hydronephrosis. No peri-transplant fluid collection seen. Color flow in the main renal artery:  Yes Color flow in the main renal vein:  Yes Duplex Doppler Evaluation: Main Renal Artery Velocity: 162 cm/sec Main Renal Artery Resistive Index: 0.83 Venous waveform in main renal vein:  Present Intrarenal resistive index in upper pole:  0.79 (normal 0.6-0.8; equivocal 0.8-0.9; abnormal >= 0.9) Intrarenal resistive index in lower pole: 0.76 (normal 0.6-0.8; equivocal 0.8-0.9; abnormal >= 0.9) Bladder: Normal for degree of bladder distention. Other findings:  None. IMPRESSION: 1. No hydronephrosis or nephrolithiasis of the renal transplant. No peritransplant fluid collection. 2. Renal transplant resistive indices within upper limits of normal range. Electronically Signed   By: AAnner CreteM.D.   On: 09/08/2020 20:45        Scheduled Meds: . albuterol  2 puff Inhalation Once  . allopurinol  100 mg Oral Daily  . ascorbic acid  500 mg Oral Daily  . aspirin  81  mg Oral Daily  . brimonidine  1 drop Both Eyes BID  . Budeson-Glycopyrrol-Formoterol  2 puff Inhalation BID  . cholecalciferol  1,000 Units Oral Daily  . cycloSPORINE  1 drop Both Eyes BID  . dorzolamide  1 drop Both Eyes Daily  . ferrous sulfate  325 mg Oral Q breakfast  . gabapentin  200 mg Oral TID  . heparin injection (subcutaneous)  5,000 Units Subcutaneous Q8H  . hydrocortisone sod succinate (SOLU-CORTEF) inj  50 mg Intravenous Q6H  . latanoprost  1 drop Both Eyes QHS  . mouth rinse  15 mL Mouth Rinse BID  . mycophenolate  180 mg Oral BID  . pantoprazole  40 mg Oral BID  . predniSONE  7.5 mg Oral Q breakfast  . tacrolimus  2 mg Oral Daily   And  . tacrolimus  1 mg Oral QHS  . valACYclovir  500 mg Oral BID  . verapamil  240 mg Oral Daily  . cyanocobalamin  1,000 mcg Oral Daily   Continuous Infusions: . cefTRIAXone (ROCEPHIN)  IV 2  g (09/09/20 1318)  . metronidazole 500 mg (09/10/20 0526)     LOS: 6 days   The patient is critically ill with multiple organ systems failure and requires high complexity decision making for assessment and support, frequent evaluation and titration of therapies, application of advanced monitoring technologies and extensive interpretation of multiple databases. Critical Care Time devoted to patient care services described in this note  Time spent: 40 minutes     Claudina Oliphant, Geraldo Docker, MD [facility] Pager [phone]  If 7PM-7AM, please contact night-coverage [email or web address] Password [email or web address] 09/10/2020, 12:59 PM

## 2020-09-10 NOTE — Progress Notes (Addendum)
[facility] Progress Note  Subjective: seen in room, less thirsty, creat down 2.3 today  Vitals:   09/10/20 0356 09/10/20 0426 09/10/20 0956 09/10/20 1245  BP: 126/66  (!) 153/54 (!) 145/78  Pulse: 75   83  Resp: 18  (!) 22 16  Temp: 97.9 F (36.6 C)  97.6 F (36.4 C) 97.7 F (36.5 C)  TempSrc: Oral  Oral Oral  SpO2: 93%  95% 97%  Weight:  71.6 kg    Height:        Exam:   alert, nad   no jvd  Chest cta bilat, occ basilar rales  Cor reg no RG  Abd soft ntnd no ascites   Ext 1+ L pretib edema, no RLE edema   Alert, NF, ox3    Repeat UA 10/17 - 0-5 rbc/ wbc   UNa <10, UCr 47    Summary:  27F s/p KT 2013 on MMF/Tac/Pred, BL SCr 1 to 1.2 with AKI in setting of acute diverticulitis.    Assessment/ Plan: 1. AKI: Admission imaging negative for acute structural renal issues; has been on outpatient immunosuppression; no clear nephrotoxins; UA at presentation with hematuria and proteinuria but repeat UA is normal. Transplant RUS is wnl. Urine lytes suggest dehydration. Likely has ATN and/or vol depletion.  Appears to be stabilizing, creat today is down to 2.3. Cont IVF at 50/hr.  2. Diverticulitis, improving, tolerating po. Doesn't really need renal diet or fluid restriction.  3. Chronic immunosuppression: on home regiment of tacrolimus, mycophenolate and prednisone.  4. 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 COPD 8. Hypertension, normotensive currently     April [name] 09/10/2020, 3:37 PM   Recent Labs  Lab 09/09/20 0507 09/09/20 0507 09/10/20 0438 09/10/20 1004  K 3.3*  --  3.7  3.7  --   BUN 66*  --  61*  66*  --   CREATININE 2.58*  --  2.34*  2.23*  --   CALCIUM 9.5  --  9.5  9.3  --   PHOS 4.2  --  3.3  3.4  --   HGB 8.9*   < > 9.3* 9.5*   < > = values in this interval not displayed.   Inpatient medications: . albuterol  2 puff Inhalation Once  . allopurinol   100 mg Oral Daily  . ascorbic acid  500 mg Oral Daily  . brimonidine  1 drop Both Eyes BID  . Budeson-Glycopyrrol-Formoterol  2 puff Inhalation BID  . cholecalciferol  1,000 Units Oral Daily  . cycloSPORINE  1 drop Both Eyes BID  . dorzolamide  1 drop Both Eyes Daily  . ferrous sulfate  325 mg Oral Q breakfast  . gabapentin  200 mg Oral TID  . latanoprost  1 drop Both Eyes QHS  . mouth rinse  15 mL Mouth Rinse BID  . mycophenolate  180 mg Oral BID  . pantoprazole  40 mg Oral BID  . predniSONE  7.5 mg Oral Q breakfast  . tacrolimus  2 mg Oral Daily   And  . tacrolimus  1 mg Oral QHS  . valACYclovir  500 mg Oral BID  . verapamil  240 mg Oral Daily  . cyanocobalamin  1,000 mcg Oral Daily   . cefTRIAXone (ROCEPHIN)  IV 2 g (09/10/20 1516)  . metronidazole 500 mg (09/10/20 0526)   acetaminophen **OR** acetaminophen, albuterol, ipratropium-albuterol, ondansetron **OR** ondansetron (ZOFRAN) IV

## 2020-09-11 ENCOUNTER — Other Ambulatory Visit: Payer: Medicare Other

## 2020-09-11 ENCOUNTER — Ambulatory Visit: Payer: Medicare Other | Admitting: Hematology and Oncology

## 2020-09-11 LAB — CBC WITH DIFFERENTIAL/PLATELET
Abs Immature Granulocytes: 0.11 10*3/uL — ABNORMAL HIGH (ref 0.00–0.07)
Basophils Absolute: 0 10*3/uL (ref 0.0–0.1)
Basophils Relative: 0 %
Eosinophils Absolute: 0 10*3/uL (ref 0.0–0.5)
Eosinophils Relative: 0 %
HCT: 29.2 % — ABNORMAL LOW (ref 36.0–46.0)
Hemoglobin: 8.9 g/dL — ABNORMAL LOW (ref 12.0–15.0)
Immature Granulocytes: 2 %
Lymphocytes Relative: 10 %
Lymphs Abs: 0.6 10*3/uL — ABNORMAL LOW (ref 0.7–4.0)
MCH: 31.1 pg (ref 26.0–34.0)
MCHC: 30.5 g/dL (ref 30.0–36.0)
MCV: 102.1 fL — ABNORMAL HIGH (ref 80.0–100.0)
Monocytes Absolute: 0.4 10*3/uL (ref 0.1–1.0)
Monocytes Relative: 7 %
Neutro Abs: 4.8 10*3/uL (ref 1.7–7.7)
Neutrophils Relative %: 81 %
Platelets: 178 10*3/uL (ref 150–400)
RBC: 2.86 MIL/uL — ABNORMAL LOW (ref 3.87–5.11)
RDW: 18.6 % — ABNORMAL HIGH (ref 11.5–15.5)
WBC: 5.9 10*3/uL (ref 4.0–10.5)
nRBC: 0 % (ref 0.0–0.2)

## 2020-09-11 LAB — COMPREHENSIVE METABOLIC PANEL
ALT: 8 U/L (ref 0–44)
AST: 12 U/L — ABNORMAL LOW (ref 15–41)
Albumin: 2.7 g/dL — ABNORMAL LOW (ref 3.5–5.0)
Alkaline Phosphatase: 41 U/L (ref 38–126)
Anion gap: 12 (ref 5–15)
BUN: 60 mg/dL — ABNORMAL HIGH (ref 8–23)
CO2: 19 mmol/L — ABNORMAL LOW (ref 22–32)
Calcium: 9.4 mg/dL (ref 8.9–10.3)
Chloride: 108 mmol/L (ref 98–111)
Creatinine, Ser: 2.11 mg/dL — ABNORMAL HIGH (ref 0.44–1.00)
GFR, Estimated: 24 mL/min — ABNORMAL LOW (ref >60)
Glucose, Bld: 141 mg/dL — ABNORMAL HIGH (ref 70–99)
Potassium: 3.3 mmol/L — ABNORMAL LOW (ref 3.5–5.1)
Sodium: 139 mmol/L (ref 135–145)
Total Bilirubin: 0.7 mg/dL (ref 0.3–1.2)
Total Protein: 6.5 g/dL (ref 6.5–8.1)

## 2020-09-11 LAB — HEMOGLOBIN AND HEMATOCRIT, BLOOD
HCT: 28.6 % — ABNORMAL LOW (ref 36.0–46.0)
HCT: 29.1 % — ABNORMAL LOW (ref 36.0–46.0)
HCT: 29.6 % — ABNORMAL LOW (ref 36.0–46.0)
Hemoglobin: 8.8 g/dL — ABNORMAL LOW (ref 12.0–15.0)
Hemoglobin: 9 g/dL — ABNORMAL LOW (ref 12.0–15.0)
Hemoglobin: 8.9 g/dL — ABNORMAL LOW (ref 12.0–15.0)

## 2020-09-11 LAB — RENAL FUNCTION PANEL
Albumin: 2.7 g/dL — ABNORMAL LOW (ref 3.5–5.0)
Anion gap: 11 (ref 5–15)
BUN: 60 mg/dL — ABNORMAL HIGH (ref 8–23)
CO2: 20 mmol/L — ABNORMAL LOW (ref 22–32)
Calcium: 9.3 mg/dL (ref 8.9–10.3)
Chloride: 108 mmol/L (ref 98–111)
Creatinine, Ser: 2.09 mg/dL — ABNORMAL HIGH (ref 0.44–1.00)
GFR, Estimated: 25 mL/min — ABNORMAL LOW (ref >60)
Glucose, Bld: 142 mg/dL — ABNORMAL HIGH (ref 70–99)
Phosphorus: 2.8 mg/dL (ref 2.5–4.6)
Potassium: 3.3 mmol/L — ABNORMAL LOW (ref 3.5–5.1)
Sodium: 139 mmol/L (ref 135–145)

## 2020-09-11 LAB — GLUCOSE, CAPILLARY
Glucose-Capillary: 104 mg/dL — ABNORMAL HIGH (ref 70–99)
Glucose-Capillary: 132 mg/dL — ABNORMAL HIGH (ref 70–99)
Glucose-Capillary: 136 mg/dL — ABNORMAL HIGH (ref 70–99)
Glucose-Capillary: 168 mg/dL — ABNORMAL HIGH (ref 70–99)
Glucose-Capillary: 198 mg/dL — ABNORMAL HIGH (ref 70–99)
Glucose-Capillary: 205 mg/dL — ABNORMAL HIGH (ref 70–99)

## 2020-09-11 LAB — MAGNESIUM: Magnesium: 1.8 mg/dL (ref 1.7–2.4)

## 2020-09-11 LAB — HEMOGLOBIN A1C
Hgb A1c MFr Bld: 5.6 % (ref 4.8–5.6)
Mean Plasma Glucose: 114 mg/dL

## 2020-09-11 LAB — PHOSPHORUS: Phosphorus: 2.8 mg/dL (ref 2.5–4.6)

## 2020-09-11 LAB — TACROLIMUS LEVEL: Tacrolimus (FK506) - LabCorp: 26.2 ng/mL — ABNORMAL HIGH (ref 2.0–20.0)

## 2020-09-11 MED ORDER — PROSOURCE PLUS PO LIQD
30.0000 mL | Freq: Every day | ORAL | Status: DC
  Administered 2020-09-11 – 2020-09-12 (×2): 30 mL via ORAL
  Filled 2020-09-11 (×2): qty 30

## 2020-09-11 NOTE — Progress Notes (Signed)
Nutrition Follow-up  DOCUMENTATION CODES:   Not applicable  INTERVENTION:  - will liberalize diet from Renal, 2L fluid restriction to Regular without fluid restriction. - will order 30 ml Prosource Plus oince/day, each supplement provides 100 kcal and 15 grams protein.   NUTRITION DIAGNOSIS:   Increased nutrient needs related to acute illness as evidenced by estimated needs. -ongoing  GOAL:   Patient will meet greater than or equal to 90% of their needs -met on average  MONITOR:   PO intake, Supplement acceptance, Labs, Weight trends  ASSESSMENT:   64 y.o. 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 She presented to the ED with abdominal pain and confusion x1 week. She developed N/V and labored breathing on 10/11. She was seen in the [facility] ED and was diagnosed with diverticulitis; sent home with abx and pain meds.  Diet advanced from NPO to CLD on 10/16 at 1650, to [place] on 10/17 at 1825, to Soft on 10/18 at 1740, and to Renal on 10/19 at 1350. Patient has been eating 75-100% since 10/18 (while on Soft diet).   Nephrology note from this AM states patient does not require Renal diet or fluid restriction. RD let patient know of plan to liberalize to Regular.  Patient was getting set up to work with PT at the time of RD visit so no further discussion with patient at this time.   Weight is slightly up since admission on 10/13. Non-pitting edema to all extremities documented in the flow sheet.     Labs reviewed; CBGs: 132, 104, 168 mg/dl, K: 3.3 mmol/l, BUN: 60 mg/dl, creatinine: 2.09 mg/dl, GFR: 24 ml/min. Medications reviewed; 500 mg ascorbic acid/day, 1000 units cholecalciferol/day, 325 mg ferrous sulfate/day, sliding scale novolog, 40 mg oral protonix BID, 7.5 mg deltasone/day, 1000 mcg oral cyanocobalamin/day.  IVF; NS @ 50 ml/hr.   Diet Order:   Diet Order             Diet renal with fluid restriction Fluid restriction: 2000 mL Fluid; Room service appropriate? Yes; Fluid consistency: Thin  Diet effective now                 EDUCATION NEEDS:   No education needs have been identified at this time  Skin:  Skin Assessment: Skin Integrity Issues: Skin Integrity Issues:: Stage II Stage II: L buttocks  Last BM:  10/20 (type 7)  Height:   Ht Readings from Last 1 Encounters:  09/05/20 5' 1"  (1.549 m)    Weight:   Wt Readings from Last 1 Encounters:  09/11/20 70.9 kg     Estimated Nutritional Needs:  Kcal:  1850-2050 kcal Protein:  80-95 grams Fluid:  >/= 2.2 L/day     Jarome Matin, MS, RD, LDN, CNSC Inpatient Clinical Dietitian RD pager # available in [facility]  After hours/weekend pager # available in [facility]

## 2020-09-11 NOTE — Progress Notes (Signed)
[facility] Progress Note  Subjective: seen in room, doing good, creat down to 2.0 today.   Vitals:   09/10/20 2200 09/11/20 0600 09/11/20 0620 09/11/20 1209  BP:   134/60 (!) 134/59  Pulse:   72 81  Resp:   16 (!) 22  Temp: (!) 96.5 F (35.8 C)  (!) 96.8 F (36 C) 98.3 F (36.8 C)  TempSrc: Oral   Oral  SpO2:   100% 94%  Weight:  70.9 kg    Height:        Exam:   alert, nad   no jvd  Chest cta bilat, occ basilar rales  Cor reg no RG  Abd soft ntnd no ascites   Ext 1+ L pretib edema, no RLE edema   Alert, NF, ox3    Repeat UA 10/17 - 0-5 rbc/ wbc   UNa <10, UCr 47    Summary:  40F s/p KT 2013 on MMF/Tac/Pred, BL SCr 1 to 1.2 with AKI in setting of acute diverticulitis.    Assessment/ Plan: 1. AKI: b/l creat 1.0- 1.2.  Admit imaging negative for acute structural renal issues; has been on outpatient immunosuppression; no clear nephrotoxins; UA at presentation with hematuria and proteinuria but repeat UA is normal. Transplant RUS is wnl. Urine lytes suggest dehydration. Likely has ATN and/or vol depletion.  Creat peaked at 2.58 on 10/18 and was down to 2.3 yest and 2.0 today. Good UOP , but not being recorded. No other suggestions, she will f/u with her [facility] nephrologist after dc. Will sign off.  2. Diverticulitis, improving, tolerating po. Doesn't really need renal diet or fluid restriction.  3. Chronic immunosuppression: on home regiment of tacrolimus, mycophenolate and prednisone.  4. 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 COPD 8. Hypertension, normotensive currently     Rob Paeton Latouche 09/11/2020, 12:48 PM   Recent Labs  Lab 09/10/20 0438 09/10/20 1004 09/11/20 0354 09/11/20 0935  K 3.7  3.7  --  3.3*  3.3*  --   BUN 61*  66*  --  60*  60*  --   CREATININE 2.34*  2.23*  --  2.09*  2.11*  --   CALCIUM 9.5  9.3  --  9.3  9.4  --   PHOS 3.3  3.4  --  2.8  2.8  --    HGB 9.3*   < > 8.9*  8.8* 9.0*   < > = values in this interval not displayed.   Inpatient medications: . albuterol  2 puff Inhalation Once  . allopurinol  100 mg Oral Daily  . ascorbic acid  500 mg Oral Daily  . brimonidine  1 drop Both Eyes BID  . Budeson-Glycopyrrol-Formoterol  2 puff Inhalation BID  . cholecalciferol  1,000 Units Oral Daily  . cycloSPORINE  1 drop Both Eyes BID  . dorzolamide  1 drop Both Eyes Daily  . ferrous sulfate  325 mg Oral Q breakfast  . gabapentin  200 mg Oral TID  . insulin aspart  0-15 Units Subcutaneous Q4H  . latanoprost  1 drop Both Eyes QHS  . mouth rinse  15 mL Mouth Rinse BID  . mycophenolate  180 mg Oral BID  . pantoprazole  40 mg Oral BID  . predniSONE  7.5 mg Oral Q breakfast  . tacrolimus  2 mg Oral Daily   And  . tacrolimus  1 mg Oral QHS  . valACYclovir  500 mg Oral BID  . verapamil  240 mg Oral Daily  . cyanocobalamin  1,000 mcg Oral Daily   . sodium chloride 50 mL/hr at 09/10/20 1826  . cefTRIAXone (ROCEPHIN)  IV 2 g (09/10/20 1516)  . metronidazole 500 mg (09/11/20 0559)   acetaminophen **OR** acetaminophen, albuterol, ipratropium-albuterol, ondansetron **OR** ondansetron (ZOFRAN) IV

## 2020-09-11 NOTE — Progress Notes (Signed)
Physical Therapy Treatment Patient Details Name: April [name] MRN: [id number] DOB: 08-12-1956 Today's Date: 09/11/2020    History of Present Illness Patient is 64 y.o. chronically ill 64 year old female with medical history of CHF, COPD on chronic 2 L, hypertension, reflux, polycystic disease s/p transplant.  Came in by EMS 10/13 , found to have low CBG given D10 with improvement in mentation.  Was seen 2 days ago and diagnosed with diverticulitis states that she has been vomiting and unable to keep her medications down    PT Comments    Pt making good progress today.  She was able to increase gait to 40'x3 with RW and decrease level of assist to min guard for all OOB transfers/ambulation.  Pt did require frequent rest breaks and min cues for safety with transfers/ambulation.  Pt reports has necessary DME and 24 hr support.  Continue plan of care.     Follow Up Recommendations  Home health PT;Supervision/Assistance - 24 hour     Equipment Recommendations  None recommended by PT    Recommendations for Other Services       Precautions / Restrictions Precautions Precautions: Fall Precaution Comments: mild dizziness (hx of chronic vertigio)    Mobility  Bed Mobility Overal bed mobility: Needs Assistance Bed Mobility: Supine to Sit;Sit to Supine     Supine to sit: Modified independent (Device/Increase time);HOB elevated Sit to supine: Modified independent (Device/Increase time);HOB elevated      Transfers Overall transfer level: Needs assistance Equipment used: Rolling walker (2 wheeled) Transfers: Sit to/from Stand Sit to Stand: Min guard         General transfer comment: Min guard for safety, cues to back all the way with sitting, performed sit to stand x 5 throughout session  Ambulation/Gait Ambulation/Gait assistance: Min guard Gait Distance (Feet): 40 Feet (40'x3) Assistive device: Rolling walker (2 wheeled) Gait Pattern/deviations: Step-through  pattern;Decreased stride length Gait velocity: decreased   General Gait Details: min guard for safety and line management; min cues for RW proximity, ambulated 40'x3 with 3 min rest breaks, On RA wtih sats 99%   Stairs             Wheelchair Mobility    Modified Rankin (Stroke Patients Only)       Balance Overall balance assessment: Needs assistance Sitting-balance support: No upper extremity supported Sitting balance-Leahy Scale: Good     Standing balance support: Bilateral upper extremity supported;No upper extremity supported Standing balance-Leahy Scale: Fair Standing balance comment: Used RW for walking but able to pull up underwear wtih ADLs without support; had assist with toielting ADLS due to IV line                            Cognition Arousal/Alertness: Awake/alert Behavior During Therapy: WFL for tasks assessed/performed Overall Cognitive Status: Within Functional Limits for tasks assessed                                        Exercises      General Comments        Pertinent Vitals/Pain Pain Assessment: Faces Faces Pain Scale: Hurts little more Pain Location: back - spasms at times eases with rest Pain Descriptors / Indicators: Spasm Pain Intervention(s): Monitored during session;Limited activity within patient's tolerance;Utilized relaxation techniques;Relaxation;Repositioned    Home Living  Prior Function            PT Goals (current goals can now be found in the care plan section) Acute Rehab PT Goals Patient Stated Goal: to go home, absolutely not rehab PT Goal Formulation: With patient Time For Goal Achievement: 09/19/20 Potential to Achieve Goals: Good Progress towards PT goals: Progressing toward goals    Frequency    Min 3X/week      PT Plan Current plan remains appropriate    Co-evaluation              AM-PAC PT "6 Clicks" Mobility   Outcome Measure   Help needed turning from your back to your side while in a flat bed without using bedrails?: A Little Help needed moving from lying on your back to sitting on the side of a flat bed without using bedrails?: A Little Help needed moving to and from a bed to a chair (including a wheelchair)?: A Little Help needed standing up from a chair using your arms (e.g., wheelchair or bedside chair)?: A Little Help needed to walk in hospital room?: A Little Help needed climbing 3-5 steps with a railing? : A Little 6 Click Score: 18    End of Session Equipment Utilized During Treatment: Gait belt Activity Tolerance: Patient tolerated treatment well Patient left: in bed;with call bell/phone within reach;with bed alarm set Nurse Communication: Mobility status PT Visit Diagnosis: History of falling (Z91.81);Difficulty in walking, not elsewhere classified (R26.2);Muscle weakness (generalized) (M62.81)     Time: 6381-7711 PT Time Calculation (min) (ACUTE ONLY): 34 min  Charges:  $Gait Training: 8-22 mins $Therapeutic Activity: 8-22 mins                     Abran Richard, PT Acute Rehab Services Pager [phone] [name] [place] 09/11/2020, 2:26 PM

## 2020-09-11 NOTE — Progress Notes (Signed)
PROGRESS NOTE    April [name]  PJK:[id number] DOB: 05-04-1956 DOA: 09/04/2020 PCP: [name], MD    Chief Complaint  Patient presents with  . Shortness of Breath  . Hypoglycemia  . 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  History is obtained from patient, chart, and daughter (somewhat limited from pt as mildly lethargic, though improved as we spoke). Her symptoms started about 1 week ago. The pain lasted through the weekend, but on Monday she developed nausea and vomiting in addition to labored breathing. She was [facility] at [facility] who recommended she go to [facility]. They diagnosed her with diverticulitis and sent her home with abxand pain meds. After being home, she was unable to keep things down and continued to have nausea and vomiting. Home health RN recommended she come to hospital today. EMS noted glucometer read low. She had confusion and lethargy. + fevers to 101.5 on 10/12. She notes some SOB. Denies CP. Notes continued abdominal pain. Nausea and dry heaving. Decreased appetite. Dark urine.  ED Course:Labs, imaging. IVF, abx, IV steroids. Imaging suggestive of diverticulitis. Dextrose containingfluids for hypoglycemia. Admit to hospitalistsfor sepsis 2/2 diverticulosis with adrenal insufficiency.  Assessment & Plan:   Active Problems:   Sepsis without acute organ dysfunction (HCC)   Diverticulitis   Acute renal failure superimposed on stage 3b chronic kidney disease ([place])   Diarrhea  Sepsis -On admission patient met criteria for sepsis RR> 20, HR> 90, with diverticulitis  Diverticulitis  -Patient immunocompromised secondary to kidney transplant and being on chronic steroids, and immunosuppressants  -zosyn 10/13-10/16.   Ceftriaxone/flagyl 10/17-10/20.    Bright Red Blood Per Rectum - noted 10/19, Hb stable -> per discussion with pt today, not recurrent at this time - case discussed with Dr. Collene Mares who noted pt could follow outpatient with GI  Acute metabolic encephalopathy -Secondary to sepsis -Resolved  - delirium precautions  Hypoglycemia  Adrenal Insufficiency: -2/2 acute illness on chronic steroids with prednisone 7.5 mg daily for hx renal transplant. EMS noted CBG reading low.  -s/p stress dose steroids -Moderate SSI  Steroid Induced Hyperglycemia Improved, continue to monitor on home steroids regimen  Chronic respiratory failure with hypoxia/tachypnea -On 3 L O2 at home, currently on RA - improved  Hx Polycystic Kidney Disease s/p renal transplant  Acute on CKD IIIb: (Baseline Cr ~1-1.2)/ATN? Appreciate renal assistance, noted suspected ATN +/- volume depletion Creatinine peaked at 2.58, improving today Renal recommending follow up outpatient with [facility]  DVT prophylaxis: SCD Code Status: full  Family Communication: none at bedside Disposition:   Status is: Inpatient  Remains inpatient appropriate because:Inpatient level of care appropriate due to severity of illness   Dispo: The patient is from: Home              Anticipated d/c is to: Home              Anticipated d/c date is: 1 day              Patient currently is not medically stable to d/c.  Consultants:   renal  Procedures:  none  Antimicrobials:  Anti-infectives (From admission, onward)   Start     Dose/Rate Route Frequency Ordered Stop   09/08/20 1400  cefTRIAXone (ROCEPHIN) 2 g in sodium chloride 0.9 % 100 mL IVPB  Status:  Discontinued        2 g 200 mL/hr over 30 Minutes Intravenous Every 24 hours 09/08/20 1221 09/11/20 1443   09/08/20 1400  metroNIDAZOLE (FLAGYL) IVPB 500 mg  Status:  Discontinued        500 mg 100 mL/hr over 60 Minutes Intravenous Every 8 hours 09/08/20 1221 09/11/20 1443   09/04/20  2200  valACYclovir (VALTREX) tablet 500 mg        500 mg Oral 2 times daily 09/04/20 1934     09/04/20 2200  piperacillin-tazobactam (ZOSYN) IVPB 3.375 g  Status:  Discontinued        3.375 g 12.5 mL/hr over 240 Minutes Intravenous Every 8 hours 09/04/20 1837 09/08/20 1220   09/04/20 1330  cefTRIAXone (ROCEPHIN) 2 g in sodium chloride 0.9 % 100 mL IVPB        2 g 200 mL/hr over 30 Minutes Intravenous  Once 09/04/20 1329 09/04/20 1445   09/04/20 1330  metroNIDAZOLE (FLAGYL) IVPB 500 mg        500 mg 100 mL/hr over 60 Minutes Intravenous  Once 09/04/20 1329 09/04/20 1522     Subjective: No new complaints Feeling better overall  Objective: Vitals:   09/10/20 2200 09/11/20 0600 09/11/20 0620 09/11/20 1209  BP:   134/60 (!) 134/59  Pulse:   72 81  Resp:   16 (!) 22  Temp: (!) 96.5 F (35.8 C)  (!) 96.8 F (36 C) 98.3 F (36.8 C)  TempSrc: Oral   Oral  SpO2:   100% 94%  Weight:  70.9 kg    Height:        Intake/Output Summary (Last 24 hours) at 09/11/2020 1525 Last data filed at 09/11/2020 1030 Gross per 24 hour  Intake 1489.5 ml  Output --  Net 1489.5 ml   Filed Weights   09/09/20 0500 09/10/20 0426 09/11/20 0600  Weight: 71.5 kg 71.6 kg 70.9 kg    Examination:  General exam: Appears calm and comfortable  Respiratory system: Clear to auscultation. Respiratory effort normal. Cardiovascular system: S1 & S2 heard, RRR. Gastrointestinal system: Abdomen is nondistended, soft and nontender.  Central nervous system: Alert and oriented. No focal neurological deficits. Extremities: no LEE Skin: No rashes, lesions or ulcers Psychiatry: Judgement and insight appear normal. Mood & affect appropriate.     Data Reviewed: I have personally reviewed following labs and imaging studies  CBC: Recent Labs  Lab 09/07/20 0709 09/07/20 0709 09/08/20 0707 09/08/20 0707 09/09/20 0507 09/09/20 0507 09/10/20 0438 09/10/20 0438 09/10/20 1004 09/10/20 1447 09/10/20 2049  09/11/20 0354 09/11/20 0935  WBC 6.4  --  3.8*  --  3.6*  --  3.4*  --   --   --   --  5.9  --   NEUTROABS 5.3  --  3.3  --  3.2  --  2.9  --   --   --   --  4.8  --   HGB 9.3*   < > 8.7*   < > 8.9*   < > 9.3*   < > 9.5* 9.2* 9.1* 8.9*  8.8* 9.0*  HCT 30.0*   < > 27.9*   < > 28.5*   < > 30.5*   < > 30.4* 29.8* 29.7* 29.2*  28.6* 29.1*  MCV 101.4*  --  100.0  --  99.7  --  101.3*  --   --   --   --  102.1*  --   PLT 159  --  123*  --  128*  --  135*  --   --   --   --  178  --    < > = values in this interval not displayed.    Basic Metabolic Panel: Recent Labs  Lab 09/07/20 0709 09/08/20 0707 09/09/20 0507 09/10/20 0438 09/11/20 0354  NA 133* 131* 131* 132*  131* 139  139  K 3.1* 3.8 3.3* 3.7  3.7 3.3*  3.3*  CL 100 100 99 101  103 108  108  CO2 19* 18* 19* 17*  18* 20*  19*  GLUCOSE 85 111* 128* 187*  193* 142*  141*  BUN 61* 71* 66* 61*  66* 60*  60*  CREATININE 2.18* 2.55* 2.58* 2.34*  2.23* 2.09*  2.11*  CALCIUM 9.0 9.2 9.5 9.5  9.3 9.3  9.4  MG 2.1 2.1 1.9 1.9 1.8  PHOS 4.4 4.7* 4.2 3.3  3.4 2.8  2.8    GFR: Estimated Creatinine Clearance: 24.8 mL/min (Lashay Osborne) (by C-G formula based on SCr of 2.09 mg/dL (H)).  Liver Function Tests: Recent Labs  Lab 09/07/20 0709 09/08/20 0707 09/09/20 0507 09/10/20 0438 09/11/20 0354  AST 15 11* 14* 14* 12*  ALT 8 9 9 8 8   ALKPHOS 40 35* 39 46 41  BILITOT 1.0 1.2 0.7 0.5 0.7  PROT 7.2 7.2 7.2 7.0 6.5  ALBUMIN 3.0* 3.0* 3.0* 2.8*  3.1* 2.7*  2.7*    CBG: Recent Labs  Lab 09/10/20 1954 09/10/20 2322 09/11/20 0307 09/11/20 0736 09/11/20 1206  GLUCAP 177* 106* 132* 104* 168*     Recent Results (from the past 240 hour(s))  Respiratory Panel by RT PCR (Flu Abrahm Mancia&B, Covid) - Nasopharyngeal Swab     Status: None   Collection Time: 09/04/20  2:14 PM   Specimen: Nasopharyngeal Swab  Result Value Ref Range Status   SARS Coronavirus 2 by RT PCR NEGATIVE NEGATIVE Final    Comment: (NOTE) SARS-CoV-2 target  nucleic acids are NOT DETECTED.  The SARS-CoV-2 RNA is generally detectable in upper respiratoy specimens during the acute phase of infection. The lowest concentration of SARS-CoV-2 viral copies this assay can detect is 131 copies/mL. Derin Matthes negative result does not preclude SARS-Cov-2 infection and should not be used as the sole basis for treatment or other patient management decisions. Ziah Leandro negative result may occur with  improper specimen collection/handling, submission of specimen other than nasopharyngeal swab, presence of viral mutation(s) within the areas targeted by this assay, and inadequate number of viral copies (<131 copies/mL). Herminia Warren negative result must be combined with clinical observations, patient history, and epidemiological information. The expected result is Negative.  Fact Sheet for Patients:  PinkCheek.be  Fact Sheet for Healthcare Providers:  GravelBags.it  This test is no t yet approved or cleared by the [place] FDA and  has been authorized for detection and/or diagnosis of SARS-CoV-2 by FDA under an Emergency Use Authorization (EUA). This EUA will remain  in effect (meaning this test can be used) for the duration of the COVID-19 declaration under Section 564(b)(1) of the Act, 21 U.S.C. section 360bbb-3(b)(1), unless the authorization is terminated or revoked sooner.     Influenza Shafin Pollio by PCR NEGATIVE NEGATIVE Final   Influenza B by PCR NEGATIVE NEGATIVE Final    Comment: (NOTE) The Xpert Xpress SARS-CoV-2/FLU/RSV assay is intended as an aid in  the diagnosis of influenza from Nasopharyngeal swab specimens and  should not be used as Nicholis Stepanek sole basis for treatment. Nasal washings and  aspirates are unacceptable for Xpert Xpress SARS-CoV-2/FLU/RSV  testing.  Fact Sheet for Patients: PinkCheek.be  Fact Sheet for Healthcare  Providers: GravelBags.it  This test is not yet approved or cleared by the [place] FDA and  has been authorized for detection and/or diagnosis of SARS-CoV-2 by  FDA under an Emergency Use Authorization (EUA). This EUA will remain  in effect (meaning this test can be used) for the duration of the  Covid-19 declaration under Section 564(b)(1) of the Act, 21  U.S.C. section 360bbb-3(b)(1), unless the authorization is  terminated or revoked. Performed at [facility], [place] [address], [place], [place] [address]   Blood Culture (routine x 2)     Status: None   Collection Time: 09/04/20  2:18 PM   Specimen: BLOOD  Result Value Ref Range Status   Specimen Description   Final    BLOOD RIGHT ANTECUBITAL Performed at [place] [address], [place], [place] [address]    Special Requests   Final    BOTTLES DRAWN AEROBIC AND ANAEROBIC Blood Culture adequate volume Performed at [place] [address] [place], [place] [address]    Culture   Final    NO GROWTH 5 DAYS Performed at [facility], [place] [address] [place], [place] [address]    Report Status 09/09/2020 FINAL  Final  Urine culture     Status: None   Collection Time: 09/04/20  3:27 PM   Specimen: In/Out Cath Urine  Result Value Ref Range Status   Specimen Description   Final    IN/OUT CATH URINE Performed at [place] [address], [place], [place] [address]    Special Requests   Final    NONE Performed at [name], [place] [address], [place], [place] [address]    Culture   Final    NO GROWTH Performed at [facility], [place] [address] [place], [place] [address]    Report Status 09/05/2020 FINAL  Final  MRSA PCR Screening     Status: None   Collection Time: 09/04/20  6:11 PM   Specimen: Nasal Mucosa; Nasopharyngeal  Result Value Ref Range Status   MRSA by PCR  NEGATIVE NEGATIVE Final    Comment:        The GeneXpert MRSA Assay (FDA approved for NASAL specimens only), is one component of Correna Meacham comprehensive MRSA colonization surveillance program. It is not intended to diagnose MRSA infection nor to guide or monitor treatment for MRSA infections. Performed at [facility], [place] [address] [place], [place] [address]   Blood Culture (routine x 2)     Status: None   Collection Time: 09/04/20  7:12 PM   Specimen: BLOOD RIGHT HAND  Result Value Ref Range Status   Specimen Description   Final    BLOOD RIGHT HAND Performed at [place] [address] [place], [place] [address]    Special Requests   Final    BOTTLES DRAWN AEROBIC AND ANAEROBIC Blood Culture adequate volume Performed at [place] [address] [place], [place] [address]    Culture   Final    NO GROWTH 5 DAYS Performed at [facility], [place] [address] [place], [place] [address]    Report Status 09/09/2020 FINAL  Final         Radiology Studies: No results found.      Scheduled Meds: . (feeding  supplement) PROSource Plus  30 mL Oral Daily  . albuterol  2 puff Inhalation Once  . allopurinol  100 mg Oral Daily  . ascorbic acid  500 mg Oral Daily  . brimonidine  1 drop Both Eyes BID  . Budeson-Glycopyrrol-Formoterol  2 puff Inhalation BID  . cholecalciferol  1,000 Units Oral Daily  . cycloSPORINE  1 drop Both Eyes BID  . dorzolamide  1 drop Both Eyes Daily  . ferrous sulfate  325 mg Oral Q breakfast  . gabapentin  200 mg Oral TID  . insulin aspart  0-15 Units Subcutaneous Q4H  . latanoprost  1 drop Both Eyes QHS  . mouth rinse  15 mL Mouth Rinse BID  . mycophenolate  180 mg Oral BID  . pantoprazole  40 mg Oral BID  . predniSONE  7.5 mg Oral Q breakfast  . tacrolimus  2 mg Oral Daily   And  . tacrolimus  1 mg Oral QHS  . valACYclovir  500 mg Oral BID  . verapamil  240 mg Oral Daily  .  cyanocobalamin  1,000 mcg Oral Daily   Continuous Infusions: . sodium chloride 50 mL/hr at 09/10/20 1826     LOS: 7 days    Time spent: over 30 min    Fayrene Helper, MD [facility]   To contact the attending provider between 7A-7P or the covering provider during after hours 7P-7A, please log into the web site [email or web address] and access using universal [place] password for that web site. If you do not have the password, please call the hospital operator.  09/11/2020, 3:25 PM

## 2020-09-12 DIAGNOSIS — K5792 Diverticulitis of intestine, part unspecified, without perforation or abscess without bleeding: Secondary | ICD-10-CM

## 2020-09-12 DIAGNOSIS — G9340 Encephalopathy, unspecified: Secondary | ICD-10-CM

## 2020-09-12 DIAGNOSIS — R6520 Severe sepsis without septic shock: Secondary | ICD-10-CM

## 2020-09-12 LAB — CBC WITH DIFFERENTIAL/PLATELET
Abs Immature Granulocytes: 0.21 10*3/uL — ABNORMAL HIGH (ref 0.00–0.07)
Basophils Absolute: 0 10*3/uL (ref 0.0–0.1)
Basophils Relative: 0 %
Eosinophils Absolute: 0 10*3/uL (ref 0.0–0.5)
Eosinophils Relative: 1 %
HCT: 28.2 % — ABNORMAL LOW (ref 36.0–46.0)
Hemoglobin: 8.7 g/dL — ABNORMAL LOW (ref 12.0–15.0)
Immature Granulocytes: 6 %
Lymphocytes Relative: 17 %
Lymphs Abs: 0.6 10*3/uL — ABNORMAL LOW (ref 0.7–4.0)
MCH: 31.6 pg (ref 26.0–34.0)
MCHC: 30.9 g/dL (ref 30.0–36.0)
MCV: 102.5 fL — ABNORMAL HIGH (ref 80.0–100.0)
Monocytes Absolute: 0.3 10*3/uL (ref 0.1–1.0)
Monocytes Relative: 8 %
Neutro Abs: 2.6 10*3/uL (ref 1.7–7.7)
Neutrophils Relative %: 68 %
Platelets: 156 10*3/uL (ref 150–400)
RBC: 2.75 MIL/uL — ABNORMAL LOW (ref 3.87–5.11)
RDW: 19 % — ABNORMAL HIGH (ref 11.5–15.5)
WBC: 3.7 10*3/uL — ABNORMAL LOW (ref 4.0–10.5)
nRBC: 0 % (ref 0.0–0.2)

## 2020-09-12 LAB — COMPREHENSIVE METABOLIC PANEL
ALT: 6 U/L (ref 0–44)
AST: 9 U/L — ABNORMAL LOW (ref 15–41)
Albumin: 2.6 g/dL — ABNORMAL LOW (ref 3.5–5.0)
Alkaline Phosphatase: 37 U/L — ABNORMAL LOW (ref 38–126)
Anion gap: 8 (ref 5–15)
BUN: 49 mg/dL — ABNORMAL HIGH (ref 8–23)
CO2: 20 mmol/L — ABNORMAL LOW (ref 22–32)
Calcium: 9.1 mg/dL (ref 8.9–10.3)
Chloride: 112 mmol/L — ABNORMAL HIGH (ref 98–111)
Creatinine, Ser: 1.5 mg/dL — ABNORMAL HIGH (ref 0.44–1.00)
GFR, Estimated: 37 mL/min — ABNORMAL LOW (ref >60)
Glucose, Bld: 98 mg/dL (ref 70–99)
Potassium: 3.1 mmol/L — ABNORMAL LOW (ref 3.5–5.1)
Sodium: 140 mmol/L (ref 135–145)
Total Bilirubin: 0.6 mg/dL (ref 0.3–1.2)
Total Protein: 6.3 g/dL — ABNORMAL LOW (ref 6.5–8.1)

## 2020-09-12 LAB — MAGNESIUM: Magnesium: 1.6 mg/dL — ABNORMAL LOW (ref 1.7–2.4)

## 2020-09-12 LAB — GLUCOSE, CAPILLARY
Glucose-Capillary: 80 mg/dL (ref 70–99)
Glucose-Capillary: 92 mg/dL (ref 70–99)
Glucose-Capillary: 196 mg/dL — ABNORMAL HIGH (ref 70–99)
Glucose-Capillary: 210 mg/dL — ABNORMAL HIGH (ref 70–99)

## 2020-09-12 LAB — PHOSPHORUS: Phosphorus: 2.1 mg/dL — ABNORMAL LOW (ref 2.5–4.6)

## 2020-09-12 MED ORDER — POTASSIUM CHLORIDE CRYS ER 20 MEQ PO TBCR
40.0000 meq | EXTENDED_RELEASE_TABLET | ORAL | Status: AC
  Administered 2020-09-12 (×2): 40 meq via ORAL
  Filled 2020-09-12 (×2): qty 2

## 2020-09-12 MED ORDER — MAGNESIUM SULFATE 2 GM/50ML IV SOLN
2.0000 g | Freq: Once | INTRAVENOUS | Status: AC
  Administered 2020-09-12: 2 g via INTRAVENOUS
  Filled 2020-09-12: qty 50

## 2020-09-12 MED ORDER — ALLOPURINOL 100 MG PO TABS: 100.0000 mg | ORAL_TABLET | Freq: Every day | ORAL | 0 refills | Status: DC

## 2020-09-12 NOTE — TOC Progression Note (Deleted)
Transition of Care [facility]) - Progression Note    Patient Details  Name: April [name] MRN: [id number] Date of Birth: 1955/12/07  [facility] CM/SW Contact  [name] Ludwig, [place] Phone Number: 09/12/2020, 3:38 PM  Clinical Narrative:     Patient will be going home with home health [facility]  CSW signing off please reconsult with any other social work needs, home health agency has been notified of planned discharge.   Expected Discharge Plan: [place] Barriers to Discharge: Barriers Resolved  Expected Discharge Plan and Services Expected Discharge Plan: [place]   Discharge Planning Services: CM Consult Post Acute Care Choice: [place] arrangements for the past 2 months: Single Family Home Expected Discharge Date: 09/12/20                 DME Agency: NA       HH Arranged: RN, PT, OT, Nurse's Aide [place] Agency: [facility] Date [place]: 09/12/20 Time [place]: [id number] Representative spoke with at [place]: [place] ([place]) Interventions    Readmission Risk Interventions Readmission Risk Prevention Plan 05/15/2020 04/19/2020 02/09/2020  Transportation Screening Complete Complete Complete  PCP or Specialist Appt within 3-5 Days - - -  Not Complete comments - - -  HRI or [place] or Home Care Consult comments - - -  Social Work Scientific laboratory technician for [place] Planning/Counseling - - -  SW consult not completed comments - - -  Palliative Care Screening - - -  Medication Review (RN Transport planner) Referral to Pharmacy Complete Referral to Pharmacy  PCP or Specialist appointment within 3-5 days of discharge Complete Complete Complete  HRI or Home Care Consult Complete Complete Complete  SW Recovery Care/Counseling Consult Complete Complete -  Palliative Care Screening Not Applicable Not Applicable Complete  Skilled Nursing Facility  Complete Complete Patient Refused  Some recent data might be hidden

## 2020-09-12 NOTE — Plan of Care (Signed)
  Problem: Health Behavior/Discharge Planning: Goal: Ability to manage health-related needs will improve Outcome: Adequate for Discharge   Problem: Clinical Measurements: Goal: Ability to maintain clinical measurements within normal limits will improve Outcome: Adequate for Discharge Goal: Will remain free from infection Outcome: Adequate for Discharge Goal: Diagnostic test results will improve Outcome: Adequate for Discharge Goal: Respiratory complications will improve Outcome: Adequate for Discharge Goal: Cardiovascular complication will be avoided Outcome: Adequate for Discharge   Problem: Activity: Goal: Risk for activity intolerance will decrease Outcome: Adequate for Discharge   Problem: Nutrition: Goal: Adequate nutrition will be maintained Outcome: Adequate for Discharge   Problem: Elimination: Goal: Will not experience complications related to bowel motility Outcome: Adequate for Discharge Goal: Will not experience complications related to urinary retention Outcome: Adequate for Discharge   Problem: Pain Managment: Goal: General experience of comfort will improve Outcome: Adequate for Discharge   Problem: Safety: Goal: Ability to remain free from injury will improve Outcome: Adequate for Discharge   Problem: Skin Integrity: Goal: Risk for impaired skin integrity will decrease Outcome: Adequate for Discharge

## 2020-09-12 NOTE — Progress Notes (Signed)
Pt discharged home with discharge instruction. Pt verbalized an understanding. IV and telemetry monitor removed.

## 2020-09-12 NOTE — TOC Transition Note (Signed)
Transition of Care [facility]) - CM/SW Discharge Note   Patient Details  Name: April [name] MRN: [id number] Date of Birth: 20-Jul-1956  [facility]) CM/SW Contact:  [name] Ludwig, LCSW Phone Number: 09/12/2020, 3:42 PM   Clinical Narrative:    Patient will be going home with home health through [facility].  CSW signing off please reconsult with any other social work needs, home health agency has been notified of planned discharge.   Final next level of care: [place] Barriers to Discharge: Barriers Resolved   Patient Goals and CMS Choice Patient states their goals for this hospitalization and ongoing recovery are:: To return back home with home health. CMS Medicare.gov Compare Post Acute Care list provided to:: Patient Choice offered to / list presented to : Patient  Discharge Placement                       Discharge Plan and Services   Discharge Planning Services: CM Consult Post Acute Care Choice: Home Health            DME Agency: NA       HH Arranged: RN, PT, OT, Nurse's Aide [place] Agency: [facility] Date [facility] Agency Contacted: 09/12/20 Time [place]: [phone] Representative spoke with at [place]: [place] ([place]) Interventions     Readmission Risk Interventions Readmission Risk Prevention Plan 05/15/2020 04/19/2020 02/09/2020  Transportation Screening Complete Complete Complete  PCP or Specialist Appt within 3-5 Days - - -  Not Complete comments - - -  HRI or [place] or Home Care Consult comments - - -  Social Work Scientific laboratory technician for [place] Planning/Counseling - - -  SW consult not completed comments - - -  Palliative Care Screening - - -  Medication Review ([place]) Referral to Pharmacy Complete Referral to Pharmacy  PCP or Specialist appointment within 3-5 days of discharge Complete Complete Complete  HRI or Home Care Consult Complete Complete  Complete  SW Recovery Care/Counseling Consult Complete Complete -  Palliative Care Screening Not Applicable Not Applicable Complete  Skilled Nursing Facility Complete Complete Patient Refused  Some recent data might be hidden

## 2020-09-12 NOTE — Discharge Summary (Signed)
Physician Discharge Summary  April [name] UKG:[id number] DOB: Aug 22, 1956 DOA: 09/04/2020  PCP: April Caraway, MD  Admit date: 09/04/2020 Discharge date: 09/12/2020  Time spent: 40 minutes  Recommendations for Outpatient Follow-up:  1. Follow outpatient CBC/CMP 2. Lasix on hold, follow renal function/lytes outpatient as well as volume status to determine when to resume 3. Allopurinol dose reduced, follow outpatient renal function for when to resume  4. BRBPR noted on 10/19, improved, and hemoglobin stable - follow outpatient with GI 5. Follow renal function with nephrology outpatient 6. 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  History is obtained from patient, chart, and daughter (somewhat limited from pt as mildly lethargic, though improved as we spoke). Her symptoms started about 1 week ago. The pain lasted through the weekend, but on Monday she developed nausea and vomiting in addition to labored breathing. She was seen at [facility] who recommended she go to [facility]. They diagnosed her with diverticulitis and sent her home with abxand pain meds. After being home, she was unable to keep things down and continued to have nausea and vomiting. Home health RN recommended she come to hospital today. EMS noted glucometer read low. She had confusion and  lethargy. + fevers to 101.5 on 10/12. She notes some SOB. Denies CP. Notes continued abdominal pain. Nausea and dry heaving. Decreased appetite. Dark urine.  ED Course:Labs, imaging. IVF, abx, IV steroids. Imaging suggestive of diverticulitis. Dextrose containingfluids for hypoglycemia. Admit to hospitalistsfor sepsis 2/2 diverticulitis with adrenal insufficiency.  She was admitted for sepsis 2/2 diverticulitis c/b adrenal insuffiency.  She's improved after stress dose steroids, antibiotics.  Hospitalization c/b AKI.  She's now improving and has completed course of abx.  She's stable for discharge.   See below for additional details  Hospital Course:  Sepsis -On admission patient met criteria for sepsis RR> 20, HR> 90, with diverticulitis - sepsis physiology resolved  Diverticulitis -Patient immunocompromised secondary to kidney transplant and being on chronic steroids, and immunosuppressants  -zosyn 10/13-10/16.  Ceftriaxone/flagyl 10/17-10/20.    Bright Red Blood Per Rectum - noted 10/19, Hb stable -> per discussion with pt today, not recurrent at this time - case discussed with Dr. Collene [name] who noted pt could follow outpatient with GI  Acute metabolic encephalopathy -Secondary to sepsis -Resolved  - delirium precautions  Hypoglycemia  Adrenal Insufficiency: -2/2 acute illness on chronic steroids with prednisone 7.5 mg daily for hx renal transplant. EMS noted CBG reading low.  -s/p stress dose steroids -Moderate SSI - a1c 5.6  Steroid Induced Hyperglycemia Improved, continue to monitor on home steroids regimen - a1c 5.6 as above  Chronic respiratory failure with hypoxia/tachypnea -On 3 L O2 at home, currently on RA - improved  Hx Polycystic Kidney Disease s/p renal transplant Acute on CKD IIIb: (Baseline Cr ~1-1.2)/ATN? Appreciate renal assistance, noted suspected ATN +/- volume depletion Creatinine peaked at 2.58, improving today Renal recommending  follow up outpatient with CK[facility]  Left Inguinal Hernia  R Periumbilical Hernia Follow outpatient  Hypokalemia: replace and follow outpatient  No pressure ulcer noted per discussion with nursing, but pt does have MASD  Procedures:  none  Consultations:  Renal   Discharge Exam: Vitals:   09/12/20 0636 09/12/20 1146  BP: (!) 161/63 (!) 140/58  Pulse: 90 90  Resp: 17 18  Temp: 98.2 F (36.8 C) 98.3 F (36.8 C)  SpO2: 92% 96%   No complaints today Abdominal discomfort resolved Discussed d/c plan and f/u with daughter  General: No acute distress. Cardiovascular: Heart sounds show April [name] regular rate, and rhythm Lungs: Clear to auscultation bilaterally Abdomen: Soft, nontender, nondistended  Neurological: Alert and oriented 3. Moves all extremities 4 . Cranial nerves II through XII grossly intact. Skin: Warm and dry. No rashes or lesions. Extremities: No clubbing or cyanosis. No edema.   Discharge Instructions   Discharge Instructions    Call MD for:  difficulty breathing, headache or visual disturbances   Complete by: As directed    Call MD for:  extreme fatigue   Complete by: As directed    Call MD for:  hives   Complete by: As directed    Call MD for:  persistant dizziness or light-headedness   Complete by: As directed    Call MD for:  persistant nausea and vomiting   Complete by: As directed    Call MD for:  redness, tenderness, or signs of infection (pain, swelling, redness, odor or green/yellow discharge around incision site)   Complete by: As directed    Call MD for:  severe uncontrolled pain   Complete by: As directed    Call MD for:  temperature >100.4   Complete by: As directed    Diet - low sodium heart healthy   Complete by: As directed    Discharge instructions   Complete by: As directed    You were seen for sepsis from diverticulitis.  You've improved significantly on antibiotics.    Your kidney function is gradually improving.  Please follow up  with your kidney doctors as an outpatient.  Do not restart your lasix until you get follow up labs with your PCP or kidney doctor to review your kidney function.  We've reduced your allopurinol dose because of your renal function.  Follow up repeat labs to determine when you can resume your previous dose.    Please continue your protonix.  Follow up with Dr. Collene [name] as an outpatient to follow up your bright red blood per rectum and your diverticulitis.  Your bleeding could have been from diverticula or hemorrhoids, or another cause and should be followed up outpatient.    You have an umbilical hernia that should be followed outpatient.   Return for new, recurrent, or worsening symptoms.  Please ask your PCP to request records from this hospitalization so they know what was done and what the next steps will be.   Increase activity slowly   Complete by: As directed    No wound care   Complete by: As directed      Allergies as of 09/12/2020      Reactions   Infed [iron Dextran] Other (See Comments)   Chest tightness   Pentamidine Itching, Shortness Of Breath, Swelling   Erythromycin [erythromycin] Other (See Comments)   Mouth Ulcers   Iohexol Other (See Comments)   Per patient "she has had Aarika Moon kidney transplant and should never have contrast"   Oxycodone Nausea Only, Nausea And Vomiting  Erythromycin Rash   Causes breakout in mouth   Ultram [tramadol Hcl] Anxiety      Medication List    STOP taking these medications   amoxicillin-clavulanate 875-125 MG tablet Commonly known as: AUGMENTIN   furosemide 40 MG tablet Commonly known as: LASIX     TAKE these medications   acetaminophen 325 MG tablet Commonly known as: TYLENOL Take 2 tablets (650 mg total) by mouth every 6 (six) hours as needed for mild pain (or Fever >/= 101).   albuterol 108 (90 Base) MCG/ACT inhaler Commonly known as: VENTOLIN HFA Inhale 2 puffs into the lungs every 4 (four) hours as needed for wheezing or  shortness of breath.   allopurinol 100 MG tablet Commonly known as: ZYLOPRIM Take 1 tablet (100 mg total) by mouth daily. What changed:   medication strength  how much to take   ascorbic acid 500 MG tablet Commonly known as: VITAMIN C Take 500 mg by mouth daily.   aspirin 81 MG chewable tablet Chew 81 mg by mouth daily.   Breztri Aerosphere 160-9-4.8 MCG/ACT Aero Generic drug: Budeson-Glycopyrrol-Formoterol Inhale 2 puffs into the lungs 2 (two) times daily.   Breztri Aerosphere 160-9-4.8 MCG/ACT Aero Generic drug: Budeson-Glycopyrrol-Formoterol Inhale 2 puffs into the lungs in the morning and at bedtime.   brimonidine 0.1 % Soln Commonly known as: ALPHAGAN P Place 1 drop into both eyes 2 (two) times daily.   cholecalciferol 25 MCG (1000 UNIT) tablet Commonly known as: VITAMIN D3 Take 1 tablet (1,000 Units total) by mouth daily.   cyanocobalamin 1000 MCG tablet Take 1,000 mcg by mouth daily.   cycloSPORINE 0.05 % ophthalmic emulsion Commonly known as: RESTASIS Place 1 drop into both eyes 2 (two) times daily.   dorzolamide 2 % ophthalmic solution Commonly known as: TRUSOPT Place 1 drop into both eyes daily.   ferrous sulfate 325 (65 FE) MG tablet Take 325 mg by mouth daily with breakfast.   FLUoxetine 40 MG capsule Commonly known as: PROZAC Take 1 capsule (40 mg total) by mouth daily.   gabapentin 100 MG capsule Commonly known as: NEURONTIN Take 200 mg by mouth 3 (three) times daily.   ipratropium-albuterol 0.5-2.5 (3) MG/3ML Soln Commonly known as: DUONEB Take 3 mLs by nebulization every 6 (six) hours as needed (sob/wheezing).   isosorbide mononitrate 60 MG 24 hr tablet Commonly known as: IMDUR Take 1 tablet (60 mg total) by mouth daily.   levalbuterol 0.63 MG/3ML nebulizer solution Commonly known as: XOPENEX Take 0.63 mg by nebulization every 6 (six) hours as needed for wheezing or shortness of breath. Inhale 0.86m once daily.  May take 0.675mevery  six hours as needed COPD.   magnesium oxide 400 MG tablet Commonly known as: MAG-OX Take 1 tablet (400 mg total) by mouth 2 (two) times daily.   multivitamin with minerals Tabs tablet Take 1 tablet by mouth daily.   mycophenolate 180 MG EC tablet Commonly known as: MYFORTIC Take 180 mg by mouth 2 (two) times daily.   oxyCODONE 5 MG immediate release tablet Commonly known as: Roxicodone Take 1 tablet (5 mg total) by mouth every 6 (six) hours as needed for up to 15 doses for breakthrough pain.   pantoprazole 40 MG tablet Commonly known as: PROTONIX Take 1 tablet (40 mg total) by mouth 2 (two) times daily.   predniSONE 5 MG tablet Commonly known as: DELTASONE Take 7.5 mg by mouth daily with breakfast.   Prograf 1 MG capsule Generic drug: tacrolimus Take 1-2 mg  by mouth daily. 35m in the morning 184min the afternoon   promethazine 25 MG tablet Commonly known as: PHENERGAN Take 1 tablet (25 mg total) by mouth every 8 (eight) hours as needed for up to 15 doses for nausea or vomiting.   Travatan Z 0.004 % Soln ophthalmic solution Generic drug: Travoprost (BAK Free) Place 1 drop into both eyes at bedtime.   valACYclovir 500 MG tablet Commonly known as: VALTREX Take 500 mg by mouth 2 (two) times daily.   verapamil 240 MG CR tablet Commonly known as: CALAN-SR Take 240 mg by mouth daily.      Allergies  Allergen Reactions  . Infed [Iron Dextran] Other (See Comments)    Chest tightness  . Pentamidine Itching, Shortness Of Breath and Swelling  . Erythromycin [Erythromycin] Other (See Comments)    Mouth Ulcers  . Iohexol Other (See Comments)    Per patient "she has had Elany Felix kidney transplant and should never have contrast"  . Oxycodone Nausea Only and Nausea And Vomiting  . Erythromycin Rash    Causes breakout in mouth  . Ultram [Tramadol Hcl] Anxiety      The results of significant diagnostics from this hospitalization (including imaging, microbiology, ancillary and  laboratory) are listed below for reference.    Significant Diagnostic Studies: CT ABDOMEN PELVIS WO CONTRAST  Result Date: 09/04/2020 CLINICAL DATA:  Left lower quadrant abdominal pain.  Dyspnea. EXAM: CT ABDOMEN AND PELVIS WITHOUT CONTRAST TECHNIQUE: Multidetector CT imaging of the abdomen and pelvis was performed following the standard protocol without IV contrast. COMPARISON:  09/02/2020 CT abdomen/pelvis. FINDINGS: Limited motion degraded scan. Lower chest: Trace dependent bilateral pleural effusions with mild-to-moderate bibasilar atelectasis, increased. Cardiomegaly. Trace pericardial effusion/thickening, unchanged. Hepatobiliary: Normal liver size. Numerous simple cysts scattered throughout the liver, largest 3.4 cm in the left liver lobe. Numerous subcentimeter hypodense lesions scattered throughout the liver are too small to characterize and are not appreciably changed. No appreciable new liver lesions. Normal gallbladder with no radiopaque cholelithiasis. No biliary ductal dilatation. Pancreas: Normal, with no mass or duct dilation. Spleen: Mild splenomegaly. Craniocaudal splenic length 13.6 cm, stable. No splenic mass. Adrenals/Urinary Tract: Normal adrenals. Prominently enlarged polycystic kidneys bilaterally. No hydronephrosis. No renal stones. Several of the renal cysts demonstrate mural calcifications in the kidneys bilaterally, unchanged. Dominant exophytic 7.1 cm lateral interpolar right renal cyst and dominant 7.6 cm lower left renal cyst. No overtly suspicious renal masses on this noncontrast scan. Right lower quadrant transplant kidney demonstrates no hydronephrosis, no renal stones, no contour deforming renal masses and no perinephric collections. Bladder is nondistended and is obscured by streak artifact from left hip hardware with no gross bladder abnormality. Stomach/Bowel: Normal non-distended stomach. Normal caliber small bowel with no small bowel wall thickening. Normal appendix.  Moderate sigmoid diverticulosis. Mild wall thickening with associated mild pericolonic fat stranding in the mid sigmoid colon compatible with acute sigmoid diverticulitis, slightly improved compared to the CT study performed 2 days prior. No pericolonic free air or abscess. Vascular/Lymphatic: Atherosclerotic nonaneurysmal abdominal aorta. No pathologically enlarged lymph nodes in the abdomen or pelvis. Reproductive: Grossly normal uterus.  No adnexal mass. Other: No pneumoperitoneum. Small simple 2.5 x 1.8 cm cystic focus associated with an apparent small left inguinal hernia, unchanged (series 3/image 87). Small to moderate fat containing right periumbilical hernia is unchanged. Musculoskeletal: No aggressive appearing focal osseous lesions. Left total hip arthroplasty. Healed deformities in the left pubic rami. Mild thoracolumbar spondylosis. IMPRESSION: 1. Acute sigmoid diverticulitis, slightly improved compared to  the CT study performed 2 days prior. No free air or abscess. 2. Trace dependent bilateral pleural effusions with mild-to-moderate bibasilar atelectasis, increased. 3. Cardiomegaly. Trace pericardial effusion/thickening, unchanged. 4. Prominently enlarged polycystic kidneys bilaterally, unchanged. No hydronephrosis. Right lower quadrant transplant kidney is unremarkable. 5. Stable mild splenomegaly. 6. Stable small simple 2.5 cm cystic focus associated with an apparent small left inguinal hernia. 7. Stable small to moderate fat containing right periumbilical hernia. 8. Aortic Atherosclerosis (ICD10-I70.0). Electronically Signed   By: Ilona Sorrel M.D.   On: 09/04/2020 16:27   CT ABDOMEN PELVIS WO CONTRAST  Result Date: 09/02/2020 CLINICAL DATA:  64 year old female with concern for bowel obstruction secondary to umbilical hernia. EXAM: CT ABDOMEN AND PELVIS WITHOUT CONTRAST TECHNIQUE: Multidetector CT imaging of the abdomen and pelvis was performed following the standard protocol without IV  contrast. COMPARISON:  CT abdomen pelvis dated 05/14/2020. FINDINGS: Evaluation of this exam is limited in the absence of intravenous contrast. Lower chest: There are bibasilar linear atelectasis/scarring. There is trace right pleural effusion. Coronary vascular calcifications noted. No intra-abdominal free air or free fluid. Hepatobiliary: Multiple liver cysts as seen on the prior CT. No intrahepatic biliary ductal dilatation. There is sludge in the gallbladder. No pericholecystic fluid. Pancreas: Unremarkable. No pancreatic ductal dilatation or surrounding inflammatory changes. Spleen: Normal in size without focal abnormality. Adrenals/Urinary Tract: The adrenal glands are unremarkable. Polycystic kidneys with replaced renal parenchyma by innumerable cysts. Scattered parenchymal calcifications noted. There is no hydronephrosis or obstructing stone. There is Gracie Gupta right lower quadrant renal transplant. There is no hydronephrosis or nephrolithiasis of the transplant kidney. No peritransplant fluid collection. The urinary bladder is unremarkable. Stomach/Bowel: There is sigmoid diverticulosis. There is inflammatory changes centered at Lasha Echeverria sigmoid diverticula consistent with acute diverticulitis. There is no bowel obstruction. Normal appendix. Vascular/Lymphatic: Moderate aortoiliac atherosclerotic disease. The IVC is unremarkable. No portal venous gas. There is no adenopathy. Reproductive: The uterus is retroflexed. No adnexal masses. Other: There is Shatasha Lambing small fat containing umbilical hernia. There is mild stranding of the herniated fat, likely chronic. Correlation with clinical exam and point tenderness recommended to exclude strangulation/incarceration. No fluid collection. Musculoskeletal: Degenerative changes of the spine. Old left pubic bone fracture. Left hip arthroplasty. No acute osseous pathology. Bilateral sacral al Aven Christen insufficiency fractures similar to prior CT. IMPRESSION: 1. Sigmoid diverticulitis.  No  diverticular abscess or perforation. 2. No bowel obstruction. Normal appendix. 3. Small fat containing umbilical hernia with mild stranding of the herniated fat, likely chronic. Correlation with clinical exam and point tenderness recommended to exclude strangulation/incarceration. 4. Polycystic kidneys and liver. 5. Aortic Atherosclerosis (ICD10-I70.0). Electronically Signed   By: Anner Crete M.D.   On: 09/02/2020 22:27   CT Head Wo Contrast  Result Date: 09/04/2020 CLINICAL DATA:  Delirium. EXAM: CT HEAD WITHOUT CONTRAST TECHNIQUE: Contiguous axial images were obtained from the base of the skull through the vertex without intravenous contrast. COMPARISON:  Head CT 04/15/2020. FINDINGS: Brain: There is no acute intracranial hemorrhage. No demarcated cortical infarct. No extra-axial fluid collection. No evidence of intracranial mass. No midline shift. Vascular: No hyperdense vessel. Skull: Normal. Negative for fracture or focal lesion. Sinuses/Orbits: Visualized orbits show no acute finding. No significant paranasal sinus disease or mastoid effusion at the imaged levels. IMPRESSION: No evidence of acute intracranial abnormality. Electronically Signed   By: Kellie Simmering DO   On: 09/04/2020 16:22   US Renal Transplant w/Doppler  Result Date: 09/08/2020 CLINICAL DATA:  64 year old female with acute renal insufficiency. Renal transplant in  2013. EXAM: ULTRASOUND OF RENAL TRANSPLANT WITH RENAL DOPPLER ULTRASOUND TECHNIQUE: Ultrasound examination of the renal transplant was performed with gray-scale, color and duplex doppler evaluation. COMPARISON:  None. FINDINGS: Transplant kidney location: Right lower quadrant Transplant Kidney: Renal measurements: 11.5 x 5.5 x 7.0 cm = volume: 220m. Normal in size and parenchymal echogenicity. No evidence of mass or hydronephrosis. No peri-transplant fluid collection seen. Color flow in the main renal artery:  Yes Color flow in the main renal vein:  Yes Duplex Doppler  Evaluation: Main Renal Artery Velocity: 162 cm/sec Main Renal Artery Resistive Index: 0.83 Venous waveform in main renal vein:  Present Intrarenal resistive index in upper pole:  0.79 (normal 0.6-0.8; equivocal 0.8-0.9; abnormal >= 0.9) Intrarenal resistive index in lower pole: 0.76 (normal 0.6-0.8; equivocal 0.8-0.9; abnormal >= 0.9) Bladder: Normal for degree of bladder distention. Other findings:  None. IMPRESSION: 1. No hydronephrosis or nephrolithiasis of the renal transplant. No peritransplant fluid collection. 2. Renal transplant resistive indices within upper limits of normal range. Electronically Signed   By: AAnner CreteM.D.   On: 09/08/2020 20:45   DG CHEST PORT 1 VIEW  Result Date: 09/05/2020 CLINICAL DATA:  Dyspnea, COPD, CHF EXAM: PORTABLE CHEST 1 VIEW COMPARISON:  Chest radiograph from one day prior. FINDINGS: Stable cardiomediastinal silhouette with mild cardiomegaly. No pneumothorax. No pleural effusion. No overt pulmonary edema. Mild bibasilar scarring versus atelectasis, similar. IMPRESSION: 1. Stable mild cardiomegaly without overt pulmonary edema. 2. Stable mild bibasilar scarring versus atelectasis. Electronically Signed   By: JIlona SorrelM.D.   On: 09/05/2020 08:40   DG Chest Port 1 View  Result Date: 09/04/2020 CLINICAL DATA:  Questionable sepsis.  Evaluate for abnormality. EXAM: PORTABLE CHEST 1 VIEW COMPARISON:  Multiple priors, most recent 07/18/2020 FINDINGS: Cardiac silhouette is similar to priors and accentuated by low lung volumes and portable technique. Low lung volumes with streaky bibasilar opacities. No visible pleural effusions or pneumothorax. No acute osseous abnormality. IMPRESSION: Low lung volumes with streaky bibasilar opacities, favor atelectasis. Electronically Signed   By: FMargaretha SheffieldMD   On: 09/04/2020 14:57    Microbiology: Recent Results (from the past 240 hour(s))  Respiratory Panel by RT PCR (Flu Annet Manukyan&B, Covid) - Nasopharyngeal Swab      Status: None   Collection Time: 09/04/20  2:14 PM   Specimen: Nasopharyngeal Swab  Result Value Ref Range Status   SARS Coronavirus 2 by RT PCR NEGATIVE NEGATIVE Final    Comment: (NOTE) SARS-CoV-2 target nucleic acids are NOT DETECTED.  The SARS-CoV-2 RNA is generally detectable in upper respiratoy specimens during the acute phase of infection. The lowest concentration of SARS-CoV-2 viral copies this assay can detect is 131 copies/mL. Jayleon Mcfarlane negative result does not preclude SARS-Cov-2 infection and should not be used as the sole basis for treatment or other patient management decisions. Monico Sudduth negative result may occur with  improper specimen collection/handling, submission of specimen other than nasopharyngeal swab, presence of viral mutation(s) within the areas targeted by this assay, and inadequate number of viral copies (<131 copies/mL). Marisol Giambra negative result must be combined with clinical observations, patient history, and epidemiological information. The expected result is Negative.  Fact Sheet for Patients:  hPinkCheek.be Fact Sheet for Healthcare Providers:  hGravelBags.it This test is no t yet approved or cleared by the [place] and  has been authorized for detection and/or diagnosis of SARS-CoV-2 by FDA under an Emergency Use Authorization (EUA). This EUA will remain  in effect (meaning this test can  be used) for the duration of the COVID-19 declaration under Section 564(b)(1) of the Act, 21 U.S.C. section 360bbb-3(b)(1), unless the authorization is terminated or revoked sooner.     Influenza Zaylen Susman by PCR NEGATIVE NEGATIVE Final   Influenza B by PCR NEGATIVE NEGATIVE Final    Comment: (NOTE) The Xpert Xpress SARS-CoV-2/FLU/RSV assay is intended as an aid in  the diagnosis of influenza from Nasopharyngeal swab specimens and  should not be used as Yeng Perz sole basis for treatment. Nasal washings and  aspirates are  unacceptable for Xpert Xpress SARS-CoV-2/FLU/RSV  testing.  Fact Sheet for Patients: PinkCheek.be  Fact Sheet for Healthcare Providers: GravelBags.it  This test is not yet approved or cleared by the [place] FDA and  has been authorized for detection and/or diagnosis of SARS-CoV-2 by  FDA under an Emergency Use Authorization (EUA). This EUA will remain  in effect (meaning this test can be used) for the duration of the  Covid-19 declaration under Section 564(b)(1) of the Act, 21  U.S.C. section 360bbb-3(b)(1), unless the authorization is  terminated or revoked. Performed at [name], [place] [address] [place], [place] [address]   Blood Culture (routine x 2)     Status: None   Collection Time: 09/04/20  2:18 PM   Specimen: BLOOD  Result Value Ref Range Status   Specimen Description   Final    BLOOD RIGHT ANTECUBITAL Performed at [facility] [address] [place], [place] [address]    Special Requests   Final    BOTTLES DRAWN AEROBIC AND ANAEROBIC Blood Culture adequate volume Performed at [facility] [address], [place], [place] [address]    Culture   Final    NO GROWTH 5 DAYS Performed at [facility], [place] [address] [place], [place] [address]    Report Status 09/09/2020 FINAL  Final  Urine culture     Status: None   Collection Time: 09/04/20  3:27 PM   Specimen: In/Out Cath Urine  Result Value Ref Range Status   Specimen Description   Final    IN/OUT CATH URINE Performed at [facility] [address] [place], [place] [address]    Special Requests   Final    NONE Performed at [facility], [place] [address], [place], [place] [address]    Culture   Final    NO GROWTH Performed at [facility], [place] [address] [place], [place] [address]    Report Status 09/05/2020 FINAL  Final  MRSA PCR  Screening     Status: None   Collection Time: 09/04/20  6:11 PM   Specimen: Nasal Mucosa; Nasopharyngeal  Result Value Ref Range Status   MRSA by PCR NEGATIVE NEGATIVE Final    Comment:        The GeneXpert MRSA Assay (FDA approved for NASAL specimens only), is one component of Meloney Feld comprehensive MRSA colonization surveillance program. It is not intended to diagnose MRSA infection nor to guide or monitor treatment for MRSA infections. Performed at [facility], [place] [address], [place], [place] [address]   Blood Culture (routine x 2)     Status: None   Collection Time: 09/04/20  7:12 PM   Specimen: BLOOD RIGHT HAND  Result Value Ref Range Status   Specimen Description   Final    BLOOD RIGHT HAND Performed at [facility] [address], [place], [place] [address]  Special Requests   Final    BOTTLES DRAWN AEROBIC AND ANAEROBIC Blood Culture adequate volume Performed at [facility] [address], [place], [place] [address]    Culture   Final    NO GROWTH 5 DAYS Performed at [facility], [place] [address], [place], [place] [address]    Report Status 09/09/2020 FINAL  Final     Labs: Basic Metabolic Panel: Recent Labs  Lab 09/08/20 0707 09/09/20 0507 09/10/20 0438 09/11/20 0354 09/12/20 0459  NA 131* 131* 132*  131* 139  139 140  K 3.8 3.3* 3.7  3.7 3.3*  3.3* 3.1*  CL 100 99 101  103 108  108 112*  CO2 18* 19* 17*  18* 20*  19* 20*  GLUCOSE 111* 128* 187*  193* 142*  141* 98  BUN 71* 66* 61*  66* 60*  60* 49*  CREATININE 2.55* 2.58* 2.34*  2.23* 2.09*  2.11* 1.50*  CALCIUM 9.2 9.5 9.5  9.3 9.3  9.4 9.1  MG 2.1 1.9 1.9 1.8 1.6*  PHOS 4.7* 4.2 3.3  3.4 2.8  2.8 2.1*   Liver Function Tests: Recent Labs  Lab 09/08/20 0707 09/09/20 0507 09/10/20 0438 09/11/20 0354 09/12/20 0459  AST 11* 14* 14* 12* 9*  ALT 9 9 8 8 6   ALKPHOS 35* 39 46 41 37*  BILITOT 1.2 0.7 0.5 0.7 0.6  PROT  7.2 7.2 7.0 6.5 6.3*  ALBUMIN 3.0* 3.0* 2.8*  3.1* 2.7*  2.7* 2.6*   No results for input(s): LIPASE, AMYLASE in the last 168 hours. No results for input(s): AMMONIA in the last 168 hours. CBC: Recent Labs  Lab 09/08/20 0707 09/08/20 0707 09/09/20 0507 09/09/20 0507 09/10/20 0438 09/10/20 1004 09/10/20 2049 09/11/20 0354 09/11/20 0935 09/11/20 1510 09/12/20 0459  WBC 3.8*  --  3.6*  --  3.4*  --   --  5.9  --   --  3.7*  NEUTROABS 3.3  --  3.2  --  2.9  --   --  4.8  --   --  2.6  HGB 8.7*   < > 8.9*   < > 9.3*   < > 9.1* 8.9*  8.8* 9.0* 8.9* 8.7*  HCT 27.9*   < > 28.5*   < > 30.5*   < > 29.7* 29.2*  28.6* 29.1* 29.6* 28.2*  MCV 100.0  --  99.7  --  101.3*  --   --  102.1*  --   --  102.5*  PLT 123*  --  128*  --  135*  --   --  178  --   --  156   < > = values in this interval not displayed.   Cardiac Enzymes: No results for input(s): CKTOTAL, CKMB, CKMBINDEX, TROPONINI in the last 168 hours. BNP: BNP (last 3 results) Recent Labs    07/01/20 2334 07/18/20 1546 09/04/20 1417  BNP 557.8* 123.4* 1,005.3*    ProBNP (last 3 results) No results for input(s): PROBNP in the last 8760 hours.  CBG: Recent Labs  Lab 09/11/20 2057 09/11/20 2358 09/12/20 0350 09/12/20 0739 09/12/20 1143  GLUCAP 205* 136* 80 92 196*       Signed:  Fayrene Helper MD.  [facility] 09/12/2020, 1:58 PM

## 2020-09-18 ENCOUNTER — Telehealth: Payer: Self-pay | Admitting: Pulmonary Disease

## 2020-09-18 NOTE — Telephone Encounter (Signed)
Yes they will be fine.  Please have them send the protocols

## 2020-09-18 NOTE — Telephone Encounter (Signed)
April [name] is aware of below message and voiced her understanding.  Protocol will be faxed to [facility] office.  Nothing further needed at this time.

## 2020-09-18 NOTE — Telephone Encounter (Signed)
Spoke with Beverlee Nims with [facility]. Since calling, pt has "leveled out" and is doing better. Beverlee Nims would like to know if Dr. Vaughan Browner would be willing to sign CHF and COPD protocols for the pt to have in case this happens again. She has been trying to get the pt's PCP to sign these and she is refusing.  Dr. Vaughan Browner - please advise.  Message will not be marked red as pt is NOT in distress.

## 2020-09-18 NOTE — Telephone Encounter (Signed)
Lm for Hinton Dyer with [facility].

## 2020-09-25 ENCOUNTER — Observation Stay (HOSPITAL_COMMUNITY)
Admission: EM | Admit: 2020-09-25 | Discharge: 2020-09-27 | Disposition: A | Discharge status: Home / Self Care | Payer: Medicare Other | Attending: Internal Medicine | Admitting: Internal Medicine

## 2020-09-25 ENCOUNTER — Encounter (HOSPITAL_COMMUNITY): Payer: Self-pay | Admitting: Emergency Medicine

## 2020-09-25 ENCOUNTER — Other Ambulatory Visit: Payer: Self-pay

## 2020-09-25 DIAGNOSIS — Z7982 Long term (current) use of aspirin: Secondary | ICD-10-CM | POA: Diagnosis not present

## 2020-09-25 DIAGNOSIS — I132 Hypertensive heart and chronic kidney disease with heart failure and with stage 5 chronic kidney disease, or end stage renal disease: Secondary | ICD-10-CM | POA: Insufficient documentation

## 2020-09-25 DIAGNOSIS — J45909 Unspecified asthma, uncomplicated: Secondary | ICD-10-CM | POA: Insufficient documentation

## 2020-09-25 DIAGNOSIS — Z96642 Presence of left artificial hip joint: Secondary | ICD-10-CM | POA: Diagnosis not present

## 2020-09-25 DIAGNOSIS — I5033 Acute on chronic diastolic (congestive) heart failure: Secondary | ICD-10-CM | POA: Diagnosis not present

## 2020-09-25 DIAGNOSIS — Z940 Kidney transplant status: Secondary | ICD-10-CM

## 2020-09-25 DIAGNOSIS — K625 Hemorrhage of anus and rectum: Secondary | ICD-10-CM

## 2020-09-25 DIAGNOSIS — E782 Mixed hyperlipidemia: Secondary | ICD-10-CM | POA: Diagnosis present

## 2020-09-25 DIAGNOSIS — K922 Gastrointestinal hemorrhage, unspecified: Secondary | ICD-10-CM | POA: Diagnosis not present

## 2020-09-25 DIAGNOSIS — Z20822 Contact with and (suspected) exposure to covid-19: Secondary | ICD-10-CM | POA: Insufficient documentation

## 2020-09-25 DIAGNOSIS — Z87891 Personal history of nicotine dependence: Secondary | ICD-10-CM | POA: Insufficient documentation

## 2020-09-25 DIAGNOSIS — N186 End stage renal disease: Secondary | ICD-10-CM | POA: Insufficient documentation

## 2020-09-25 DIAGNOSIS — Z79899 Other long term (current) drug therapy: Secondary | ICD-10-CM | POA: Diagnosis not present

## 2020-09-25 DIAGNOSIS — E785 Hyperlipidemia, unspecified: Secondary | ICD-10-CM | POA: Diagnosis present

## 2020-09-25 DIAGNOSIS — I5032 Chronic diastolic (congestive) heart failure: Secondary | ICD-10-CM | POA: Diagnosis present

## 2020-09-25 DIAGNOSIS — I10 Essential (primary) hypertension: Secondary | ICD-10-CM | POA: Diagnosis present

## 2020-09-25 LAB — TYPE AND SCREEN
ABO/RH(D): A NEG
Antibody Screen: NEGATIVE

## 2020-09-25 LAB — CBC
HCT: 29.2 % — ABNORMAL LOW (ref 36.0–46.0)
Hemoglobin: 8.5 g/dL — ABNORMAL LOW (ref 12.0–15.0)
MCH: 31.8 pg (ref 26.0–34.0)
MCHC: 29.1 g/dL — ABNORMAL LOW (ref 30.0–36.0)
MCV: 109.4 fL — ABNORMAL HIGH (ref 80.0–100.0)
Platelets: 185 10*3/uL (ref 150–400)
RBC: 2.67 MIL/uL — ABNORMAL LOW (ref 3.87–5.11)
RDW: 20.1 % — ABNORMAL HIGH (ref 11.5–15.5)
WBC: 4.7 10*3/uL (ref 4.0–10.5)
nRBC: 0 % (ref 0.0–0.2)

## 2020-09-25 LAB — COMPREHENSIVE METABOLIC PANEL
ALT: 10 U/L (ref 0–44)
AST: 14 U/L — ABNORMAL LOW (ref 15–41)
Albumin: 3.2 g/dL — ABNORMAL LOW (ref 3.5–5.0)
Alkaline Phosphatase: 73 U/L (ref 38–126)
Anion gap: 9 (ref 5–15)
BUN: 49 mg/dL — ABNORMAL HIGH (ref 8–23)
CO2: 26 mmol/L (ref 22–32)
Calcium: 9.5 mg/dL (ref 8.9–10.3)
Chloride: 103 mmol/L (ref 98–111)
Creatinine, Ser: 1.44 mg/dL — ABNORMAL HIGH (ref 0.44–1.00)
GFR, Estimated: 41 mL/min — ABNORMAL LOW (ref >60)
Glucose, Bld: 160 mg/dL — ABNORMAL HIGH (ref 70–99)
Potassium: 4.3 mmol/L (ref 3.5–5.1)
Sodium: 138 mmol/L (ref 135–145)
Total Bilirubin: 0.9 mg/dL (ref 0.3–1.2)
Total Protein: 8.1 g/dL (ref 6.5–8.1)

## 2020-09-25 LAB — POC OCCULT BLOOD, ED: Fecal Occult Bld: POSITIVE — AB

## 2020-09-25 NOTE — ED Provider Notes (Signed)
[facility] DEPT Provider Note   CSN: [id number] Arrival date & time: 09/25/20  2015     History Chief Complaint  Patient presents with   Rectal Bleeding    April [name] is a 64 y.o. female.  64 year old female with history of renal transplant, diverticular bleed who presents with bright red blood per rectum x3 today.  No associate abdominal pain no fever or chills.  Recent admission for diverticulitis.  Denies taking any blood thinners other than aspirin.  Symptoms persistent and nothing makes them better.  No treatment use prior to arrival        Past Medical History:  Diagnosis Date   Arthritis    Asthma    CHF (congestive heart failure) ([place])    COPD (chronic obstructive pulmonary disease) ([place])    Depression    Essential hypertension 02/08/2020   GERD (gastroesophageal reflux disease)    Hyperlipidemia    Hyperparathyroidism    Polycystic kidney    PONV (postoperative nausea and vomiting)     Patient Active Problem List   Diagnosis Date Noted   Diarrhea 09/10/2020   Acute renal failure superimposed on stage 3b chronic kidney disease ([facility]) 09/08/2020   Diverticulitis 09/04/2020   SIRS (systemic inflammatory response syndrome) ([facility]) 07/02/2020   Chronic kidney disease, stage 3a (HCC)    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   History of immunosuppressive therapy 05/27/2020   FUO (fever of unknown origin) 05/15/2020   Immunocompromised state ([place])    Decubitus ulcer of coccyx, unstageable ([place]) 05/01/2020   Pericardial effusion 04/16/2020   Chronic respiratory failure with hypoxia ([place]) 04/16/2020   Proteinuria 04/16/2020   Pressure injury of skin 04/08/2020   COPD (chronic obstructive pulmonary disease) ([place]) 04/07/2020   SVT (supraventricular tachycardia) (HCC)    Anemia of chronic disease    Palliative care encounter    Goals of care, counseling/discussion    Essential hypertension  02/08/2020   Glaucoma 02/08/2020   Sepsis with encephalopathy without septic shock ([place]) 01/31/2020   Fracture of left superior pubic ramus (HCC)    Pain    Anxiety state    Left displaced femoral neck fracture ([place]) 12/15/2019   Status post total hip replacement, left    Post-op pain    Polycystic kidney    Closed left hip fracture ([facility] [facility]) 12/07/2019   Left wrist fracture 12/07/2019   Medication management 09/22/2019   Physical deconditioning 09/22/2019   Acute respiratory failure with hypoxia ([facility] [facility]) 04/19/2019   Acute on chronic diastolic CHF (congestive heart failure) ([place]) 04/19/2019   Bilateral closed proximal tibial fracture 12/21/2018   Asthma, chronic, unspecified asthma severity, with acute exacerbation 10/03/2018   Chronic diastolic CHF (congestive heart failure) ([place]) 10/01/2017   Asthma, mild intermittent 07/15/2016   GERD without esophagitis 07/15/2016   Renal transplant recipient 07/15/2016   Hyperlipidemia 07/15/2016   Depression 07/15/2016   Bronchitis, mucopurulent recurrent ([place]) 08/08/2014   Chronic cough 07/24/2014   End stage renal disease ([place]) 77/41/2878   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, implant, and graft 03/23/2012    Past Surgical History:  Procedure Laterality Date   AV FISTULA PLACEMENT  10-21-2010   left Brachiocephalic AVF   BILATERAL OOPHORECTOMY     BIOPSY  05/19/2020   Procedure: BIOPSY;  Surgeon: Carol Ada, MD;  Location: [facility];  Service: Endoscopy;;   CARPAL TUNNEL RELEASE  2000   CESAREAN SECTION     COLONOSCOPY WITH PROPOFOL N/A 05/19/2020   Procedure:  COLONOSCOPY WITH PROPOFOL;  Surgeon: Carol Ada, MD;  Location: [facility];  Service: Endoscopy;  Laterality: N/A;   ENTEROSCOPY N/A 05/19/2020   Procedure: ENTEROSCOPY;  Surgeon: Carol Ada, MD;  Location: [facility];  Service: Endoscopy;  Laterality: N/A;   INSERTION OF DIALYSIS CATHETER  03/31/2012   Procedure: INSERTION  OF DIALYSIS CATHETER;  Surgeon: Angelia Mould, MD;  Location: [facility];  Service: Vascular;  Laterality: N/A;  insertion of dialysis catheter right internal jugular vein   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   ORIF TIBIA FRACTURE Left 12/23/2018   Procedure: OPEN REDUCTION INTERNAL FIXATION (ORIF) TIBIA FRACTURE;  Surgeon: Shona Needles, MD;  Location: [facility];  Service: Orthopedics;  Laterality: Left;   ORIF TIBIA PLATEAU Right 12/23/2018   Procedure: OPEN REDUCTION INTERNAL FIXATION (ORIF) TIBIAL PLATEAU;  Surgeon: Shona Needles, MD;  Location: [facility];  Service: Orthopedics;  Laterality: Right;   ORIF WRIST FRACTURE Left 12/08/2019   Procedure: OPEN REDUCTION INTERNAL FIXATION (ORIF) WRIST FRACTURE, REPAIR LACERATION LEFT FOREARM;  Surgeon: Dorna Leitz, MD;  Location: [facility];  Service: Orthopedics;  Laterality: Left;   TONSILLECTOMY  1967   TOTAL HIP ARTHROPLASTY Left 12/08/2019   Procedure: TOTAL HIP ARTHROPLASTY ANTERIOR APPROACH;  Surgeon: Dorna Leitz, MD;  Location: [facility];  Service: Orthopedics;  Laterality: Left;   TUBAL LIGATION  2010     OB History   No obstetric history on file.     Family History  Problem Relation Age of Onset   Hypertension Mother    Heart disease Father        CABG history   Polycystic kidney disease Father    Polycystic kidney disease Brother     Social History   Tobacco Use   Smoking status: Former Smoker    Packs/day: 0.25    Years: 15.00    Pack years: 3.75    Types: Cigarettes    Quit date: 03/22/1998    Years since quitting: 22.5   Smokeless tobacco: Never Used  Vaping Use   Vaping Use: Never used  Substance Use Topics   Alcohol use: No   Drug use: No    Home Medications Prior to Admission medications   Medication Sig Start Date End Date Taking? Authorizing Provider  acetaminophen (TYLENOL) 325 MG tablet Take 2 tablets (650 mg total) by mouth every 6 (six) hours as needed for mild pain (or Fever >/= 101). 05/22/20    Elgergawy, Silver Huguenin, MD  albuterol (VENTOLIN HFA) 108 (90 Base) MCG/ACT inhaler Inhale 2 puffs into the lungs every 4 (four) hours as needed for wheezing or shortness of breath. 01/04/20   Angiulli, Lavon Paganini, PA-C  allopurinol (ZYLOPRIM) 100 MG tablet Take 1 tablet (100 mg total) by mouth daily. 09/12/20 10/12/20  Elodia Florence., MD  ascorbic acid (VITAMIN C) 500 MG tablet Take 500 mg by mouth daily.    [provider]  aspirin 81 MG chewable tablet Chew 81 mg by mouth daily.    [provider]  brimonidine (ALPHAGAN P) 0.1 % SOLN Place 1 drop into both eyes 2 (two) times daily.     [provider]  Budeson-Glycopyrrol-Formoterol (BREZTRI AEROSPHERE) 160-9-4.8 MCG/ACT AERO Inhale 2 puffs into the lungs 2 (two) times daily. 08/20/20   Martyn Ehrich, NP  Budeson-Glycopyrrol-Formoterol (BREZTRI AEROSPHERE) 160-9-4.8 MCG/ACT AERO Inhale 2 puffs into the lungs in the morning and at bedtime. 08/20/20   Martyn Ehrich, NP  cholecalciferol (VITAMIN D3) 25  MCG (1000 UNIT) tablet Take 1 tablet (1,000 Units total) by mouth daily. 01/04/20   Angiulli, Lavon Paganini, PA-C  cyanocobalamin 1000 MCG tablet Take 1,000 mcg by mouth daily.    [provider]  cycloSPORINE (RESTASIS) 0.05 % ophthalmic emulsion Place 1 drop into both eyes 2 (two) times daily. 01/04/20   Angiulli, Lavon Paganini, PA-C  dorzolamide (TRUSOPT) 2 % ophthalmic solution Place 1 drop into both eyes daily. 06/25/19   [provider]  ferrous sulfate 325 (65 FE) MG tablet Take 325 mg by mouth daily with breakfast.    [provider]  FLUoxetine (PROZAC) 40 MG capsule Take 1 capsule (40 mg total) by mouth daily. 01/04/20   Angiulli, Lavon Paganini, PA-C  furosemide (LASIX) 80 MG tablet Take by mouth. 09/17/20   [provider]  gabapentin (NEURONTIN) 100 MG capsule Take 200 mg by mouth 3 (three) times daily.    [provider]  ipratropium-albuterol (DUONEB) 0.5-2.5 (3) MG/3ML SOLN  Take 3 mLs by nebulization every 6 (six) hours as needed (sob/wheezing).  08/24/20   [provider]  isosorbide mononitrate (IMDUR) 60 MG 24 hr tablet Take 1 tablet (60 mg total) by mouth daily. 05/23/20   Elgergawy, Silver Huguenin, MD  levalbuterol Penne Lash) 0.63 MG/3ML nebulizer solution Take 0.63 mg by nebulization every 6 (six) hours as needed for wheezing or shortness of breath. Inhale 0.63mg  once daily.  May take 0.63mg  every six hours as needed COPD.    [provider]  magnesium oxide (MAG-OX) 400 MG tablet Take 1 tablet (400 mg total) by mouth 2 (two) times daily. 01/04/20   Angiulli, Lavon Paganini, PA-C  Multiple Vitamin (MULTIVITAMIN WITH MINERALS) TABS tablet Take 1 tablet by mouth daily. Patient not taking: Reported on 09/04/2020 02/24/20   Alma Friendly, MD  mycophenolate (MYFORTIC) 180 MG EC tablet Take 180 mg by mouth 2 (two) times daily.    [provider]  oxyCODONE (ROXICODONE) 5 MG immediate release tablet Take 1 tablet (5 mg total) by mouth every 6 (six) hours as needed for up to 15 doses for breakthrough pain. 09/02/20   Curatolo, Adam, DO  pantoprazole (PROTONIX) 40 MG tablet Take 1 tablet (40 mg total) by mouth 2 (two) times daily. 01/04/20   Angiulli, Lavon Paganini, PA-C  predniSONE (DELTASONE) 5 MG tablet Take 7.5 mg by mouth daily with breakfast.    [provider]  PROGRAF 1 MG capsule Take 1-2 mg by mouth daily. 2mg  in the morning 1mg  in the afternoon 03/22/19   [provider]  promethazine (PHENERGAN) 25 MG tablet Take 1 tablet (25 mg total) by mouth every 8 (eight) hours as needed for up to 15 doses for nausea or vomiting. 09/02/20   Curatolo, Adam, DO  TRAVATAN Z 0.004 % SOLN ophthalmic solution Place 1 drop into both eyes at bedtime. 07/03/16   [provider]  valACYclovir (VALTREX) 500 MG tablet Take 500 mg by mouth 2 (two) times daily.    [provider]  verapamil (CALAN-SR) 240 MG CR tablet Take 240 mg by mouth daily.     [provider]    Allergies    Ardyth Harps [iron dextran], Pentamidine, Erythromycin [erythromycin], Iohexol, Oxycodone, Erythromycin, and Ultram [tramadol hcl]  Review of Systems   Review of Systems  All other systems reviewed and are negative.   Physical Exam Updated Vital Signs BP 138/68 (BP Location: Right Arm)    Pulse 77    Temp 97.7 F (36.5 C) (Oral)  Resp 17    Ht 1.549 m (5\' 1" )    Wt 74.8 kg    SpO2 98%    BMI 31.18 kg/m   Physical Exam Vitals and nursing note reviewed. Exam conducted with a chaperone present.  Constitutional:      General: She is not in acute distress.    Appearance: Normal appearance. She is well-developed. She is not toxic-appearing.  HENT:     Head: Normocephalic and atraumatic.  Eyes:     General: Lids are normal.     Conjunctiva/sclera: Conjunctivae normal.     Pupils: Pupils are equal, round, and reactive to light.  Neck:     Thyroid: No thyroid mass.     Trachea: No tracheal deviation.  Cardiovascular:     Rate and Rhythm: Normal rate and regular rhythm.     Heart sounds: Normal heart sounds. No murmur heard.  No gallop.   Pulmonary:     Effort: Pulmonary effort is normal. No respiratory distress.     Breath sounds: Normal breath sounds. No stridor. No decreased breath sounds, wheezing, rhonchi or rales.  Abdominal:     General: Bowel sounds are normal. There is no distension.     Palpations: Abdomen is soft.     Tenderness: There is no abdominal tenderness. There is no rebound.  Genitourinary:    Comments: Verrucous growth noted around the rectum  Gross blood noted on digital rectal exam Musculoskeletal:        General: No tenderness. Normal range of motion.     Cervical back: Normal range of motion and neck supple.     Comments: 2+ bilateral lower extremity edema  Skin:    General: Skin is warm and dry.     Findings: No abrasion or rash.  Neurological:     Mental Status: She is alert and oriented to person, place,  and time.     GCS: GCS eye subscore is 4. GCS verbal subscore is 5. GCS motor subscore is 6.     Cranial Nerves: No cranial nerve deficit.     Sensory: No sensory deficit.  Psychiatric:        Speech: Speech normal.        Behavior: Behavior normal.     ED Results / Procedures / Treatments   Labs (all labs ordered are listed, but only abnormal results are displayed) Labs Reviewed  RESPIRATORY PANEL BY RT PCR (FLU A&B, COVID)  COMPREHENSIVE METABOLIC PANEL  CBC  POC OCCULT BLOOD, ED  TYPE AND SCREEN    EKG None  Radiology No results found.  Procedures Procedures (including critical care time)  Medications Ordered in ED Medications - No data to display  ED Course  I have reviewed the triage vital signs and the nursing notes.  Pertinent labs & imaging results that were available during my care of the patient were reviewed by me and considered in my medical decision making (see chart for details).    MDM Rules/Calculators/A&P                          Patient with grossly bloody stools a guaiac positive.  Patient's hemoglobin is stable here today.  Message sent to Dr. Collene Mares who is patient's GI record.  Will admit to the hospital service Final Clinical Impression(s) / ED Diagnoses Final diagnoses:  None    Rx / DC Orders ED Discharge Orders    None  Lacretia Leigh, MD 09/25/20 2240

## 2020-09-25 NOTE — ED Triage Notes (Signed)
Patient complaining of rectal bleeding. Patient states it started this morning and she states it is a lot.

## 2020-09-26 DIAGNOSIS — Z940 Kidney transplant status: Secondary | ICD-10-CM

## 2020-09-26 DIAGNOSIS — I5032 Chronic diastolic (congestive) heart failure: Secondary | ICD-10-CM

## 2020-09-26 DIAGNOSIS — K922 Gastrointestinal hemorrhage, unspecified: Secondary | ICD-10-CM

## 2020-09-26 DIAGNOSIS — I10 Essential (primary) hypertension: Secondary | ICD-10-CM

## 2020-09-26 DIAGNOSIS — E785 Hyperlipidemia, unspecified: Secondary | ICD-10-CM

## 2020-09-26 LAB — RESPIRATORY PANEL BY RT PCR (FLU A&B, COVID)
Influenza A by PCR: NEGATIVE
Influenza B by PCR: NEGATIVE
SARS Coronavirus 2 by RT PCR: NEGATIVE

## 2020-09-26 LAB — CBC
HCT: 26.7 % — ABNORMAL LOW (ref 36.0–46.0)
Hemoglobin: 7.8 g/dL — ABNORMAL LOW (ref 12.0–15.0)
MCH: 31.8 pg (ref 26.0–34.0)
MCHC: 29.2 g/dL — ABNORMAL LOW (ref 30.0–36.0)
MCV: 109 fL — ABNORMAL HIGH (ref 80.0–100.0)
Platelets: 163 10*3/uL (ref 150–400)
RBC: 2.45 MIL/uL — ABNORMAL LOW (ref 3.87–5.11)
RDW: 20.3 % — ABNORMAL HIGH (ref 11.5–15.5)
WBC: 4.1 10*3/uL (ref 4.0–10.5)
nRBC: 0 % (ref 0.0–0.2)

## 2020-09-26 LAB — BASIC METABOLIC PANEL
Anion gap: 8 (ref 5–15)
BUN: 44 mg/dL — ABNORMAL HIGH (ref 8–23)
CO2: 26 mmol/L (ref 22–32)
Calcium: 9.3 mg/dL (ref 8.9–10.3)
Chloride: 107 mmol/L (ref 98–111)
Creatinine, Ser: 1.31 mg/dL — ABNORMAL HIGH (ref 0.44–1.00)
GFR, Estimated: 46 mL/min — ABNORMAL LOW (ref >60)
Glucose, Bld: 94 mg/dL (ref 70–99)
Potassium: 4.2 mmol/L (ref 3.5–5.1)
Sodium: 141 mmol/L (ref 135–145)

## 2020-09-26 LAB — PROTIME-INR
INR: 1.1 (ref 0.8–1.2)
Prothrombin Time: 14.2 seconds (ref 11.4–15.2)

## 2020-09-26 LAB — HEMOGLOBIN AND HEMATOCRIT, BLOOD
HCT: 26.4 % — ABNORMAL LOW (ref 36.0–46.0)
Hemoglobin: 7.9 g/dL — ABNORMAL LOW (ref 12.0–15.0)

## 2020-09-26 MED ORDER — ALLOPURINOL 100 MG PO TABS
100.0000 mg | ORAL_TABLET | Freq: Every day | ORAL | Status: DC
  Administered 2020-09-26 – 2020-09-27 (×2): 100 mg via ORAL
  Filled 2020-09-26 (×2): qty 1

## 2020-09-26 MED ORDER — MYCOPHENOLATE SODIUM 180 MG PO TBEC
180.0000 mg | DELAYED_RELEASE_TABLET | Freq: Two times a day (BID) | ORAL | Status: DC
  Administered 2020-09-26 – 2020-09-27 (×3): 180 mg via ORAL
  Filled 2020-09-26 (×5): qty 1

## 2020-09-26 MED ORDER — PANTOPRAZOLE SODIUM 40 MG PO TBEC
40.0000 mg | DELAYED_RELEASE_TABLET | Freq: Two times a day (BID) | ORAL | Status: DC
  Administered 2020-09-26 – 2020-09-27 (×3): 40 mg via ORAL
  Filled 2020-09-26 (×3): qty 1

## 2020-09-26 MED ORDER — FLUOXETINE HCL 20 MG PO CAPS
40.0000 mg | ORAL_CAPSULE | Freq: Every day | ORAL | Status: DC
  Administered 2020-09-26 – 2020-09-27 (×2): 40 mg via ORAL
  Filled 2020-09-26 (×2): qty 2

## 2020-09-26 MED ORDER — ASCORBIC ACID 500 MG PO TABS
500.0000 mg | ORAL_TABLET | Freq: Every day | ORAL | Status: DC
  Administered 2020-09-26 – 2020-09-27 (×2): 500 mg via ORAL
  Filled 2020-09-26 (×2): qty 1

## 2020-09-26 MED ORDER — VITAMIN D 25 MCG (1000 UNIT) PO TABS
1000.0000 [IU] | ORAL_TABLET | Freq: Every day | ORAL | Status: DC
  Administered 2020-09-26 – 2020-09-27 (×2): 1000 [IU] via ORAL
  Filled 2020-09-26 (×2): qty 1

## 2020-09-26 MED ORDER — VALACYCLOVIR HCL 500 MG PO TABS
500.0000 mg | ORAL_TABLET | Freq: Two times a day (BID) | ORAL | Status: DC
  Administered 2020-09-26 – 2020-09-27 (×3): 500 mg via ORAL
  Filled 2020-09-26 (×4): qty 1

## 2020-09-26 MED ORDER — ACETAMINOPHEN 325 MG PO TABS: 650.0000 mg | ORAL_TABLET | Freq: Four times a day (QID) | ORAL | Status: DC | PRN

## 2020-09-26 MED ORDER — ALBUTEROL SULFATE HFA 108 (90 BASE) MCG/ACT IN AERS: 2.0000 | INHALATION_SPRAY | RESPIRATORY_TRACT | Status: DC | PRN

## 2020-09-26 MED ORDER — BUDESON-GLYCOPYRROL-FORMOTEROL 160-9-4.8 MCG/ACT IN AERO: 2.0000 | INHALATION_SPRAY | Freq: Two times a day (BID) | RESPIRATORY_TRACT | Status: DC

## 2020-09-26 MED ORDER — FERROUS SULFATE 325 (65 FE) MG PO TABS
325.0000 mg | ORAL_TABLET | Freq: Every day | ORAL | Status: DC
  Administered 2020-09-26 – 2020-09-27 (×2): 325 mg via ORAL
  Filled 2020-09-26 (×2): qty 1

## 2020-09-26 MED ORDER — PREDNISONE 5 MG PO TABS
7.5000 mg | ORAL_TABLET | Freq: Every day | ORAL | Status: DC
  Administered 2020-09-26 – 2020-09-27 (×2): 7.5 mg via ORAL
  Filled 2020-09-26 (×2): qty 1

## 2020-09-26 MED ORDER — MOMETASONE FURO-FORMOTEROL FUM 100-5 MCG/ACT IN AERO
2.0000 | INHALATION_SPRAY | Freq: Two times a day (BID) | RESPIRATORY_TRACT | Status: DC
  Administered 2020-09-26 – 2020-09-27 (×3): 2 via RESPIRATORY_TRACT
  Filled 2020-09-26: qty 8.8

## 2020-09-26 MED ORDER — TACROLIMUS 1 MG PO CAPS
1.0000 mg | ORAL_CAPSULE | Freq: Every day | ORAL | Status: DC
  Administered 2020-09-26: 1 mg via ORAL
  Filled 2020-09-26 (×2): qty 1

## 2020-09-26 MED ORDER — FUROSEMIDE 40 MG PO TABS
80.0000 mg | ORAL_TABLET | Freq: Two times a day (BID) | ORAL | Status: DC
  Administered 2020-09-26 – 2020-09-27 (×3): 80 mg via ORAL
  Filled 2020-09-26 (×3): qty 2

## 2020-09-26 MED ORDER — OXYCODONE HCL 5 MG PO TABS: 5.0000 mg | ORAL_TABLET | Freq: Four times a day (QID) | ORAL | Status: DC | PRN

## 2020-09-26 MED ORDER — ONDANSETRON HCL 4 MG/2ML IJ SOLN: 4.0000 mg | Freq: Four times a day (QID) | INTRAMUSCULAR | Status: DC | PRN

## 2020-09-26 MED ORDER — ACETAMINOPHEN 650 MG RE SUPP: 650.0000 mg | Freq: Four times a day (QID) | RECTAL | Status: DC | PRN

## 2020-09-26 MED ORDER — IPRATROPIUM-ALBUTEROL 0.5-2.5 (3) MG/3ML IN SOLN: 3.0000 mL | Freq: Four times a day (QID) | RESPIRATORY_TRACT | Status: DC | PRN

## 2020-09-26 MED ORDER — ONDANSETRON HCL 4 MG PO TABS: 4.0000 mg | ORAL_TABLET | Freq: Four times a day (QID) | ORAL | Status: DC | PRN

## 2020-09-26 MED ORDER — VERAPAMIL HCL ER 240 MG PO TBCR
240.0000 mg | EXTENDED_RELEASE_TABLET | Freq: Every day | ORAL | Status: DC
  Administered 2020-09-26 – 2020-09-27 (×2): 240 mg via ORAL
  Filled 2020-09-26 (×2): qty 1

## 2020-09-26 MED ORDER — GABAPENTIN 100 MG PO CAPS
200.0000 mg | ORAL_CAPSULE | Freq: Three times a day (TID) | ORAL | Status: DC
  Administered 2020-09-26 – 2020-09-27 (×5): 200 mg via ORAL
  Filled 2020-09-26 (×5): qty 2

## 2020-09-26 MED ORDER — UMECLIDINIUM BROMIDE 62.5 MCG/INH IN AEPB
1.0000 | INHALATION_SPRAY | Freq: Every day | RESPIRATORY_TRACT | Status: DC
  Administered 2020-09-26 – 2020-09-27 (×2): 1 via RESPIRATORY_TRACT
  Filled 2020-09-26: qty 7

## 2020-09-26 MED ORDER — TACROLIMUS 1 MG PO CAPS
2.0000 mg | ORAL_CAPSULE | Freq: Every day | ORAL | Status: DC
  Administered 2020-09-26 – 2020-09-27 (×2): 2 mg via ORAL
  Filled 2020-09-26 (×2): qty 2

## 2020-09-26 MED ORDER — ISOSORBIDE MONONITRATE ER 60 MG PO TB24
60.0000 mg | ORAL_TABLET | Freq: Every day | ORAL | Status: DC
  Administered 2020-09-26 – 2020-09-27 (×2): 60 mg via ORAL
  Filled 2020-09-26 (×2): qty 1

## 2020-09-26 NOTE — ED Notes (Signed)
Report given to Truddie Coco, RN.  Pt SBAR information covered at this time.  NO additional questions were asked.  NADN.  Will continue to monitor.

## 2020-09-26 NOTE — Progress Notes (Signed)
PROGRESS NOTE    April [name]  ZOX:[id number] DOB: Apr 18, 1956 DOA: 09/25/2020 PCP: April Caraway, MD    Brief Narrative:  April [name] was admitted to the hospital with a working diagnosis of acute blood loss anemia due to lower GI bleed, diverticular bleed.  64 year old female past medical history for hypertension, dyslipidemia, COPD, depression, polycystic kidney disease sp renal transplant and diverticulosis with history of diverticular bleed in the past.  She reported 3 episodes of bright red blood per rectum, no associated abdominal pain.  On her initial physical examination blood pressure was 133/69, heart rate 87, respiratory rate 18, temperature 97.8, oxygen saturation 94%.  Her lungs are clear to auscultation bilaterally, heart S1-S2, present rhythmic, soft abdomen, no lower extremity edema. Sodium 134, potassium 4.3, chloride 103, bicarb 26, glucose 160, BUN 49, creatinine 1.44, white cell count 4.7, hemoglobin 8.5, hematocrit 29.2, platelets 185.  SARS COVID-19 negative.    Assessment & Plan:   Principal Problem:   Lower GI bleeding Active Problems:   Renal transplant recipient   Hyperlipidemia   Chronic diastolic CHF (congestive heart failure) (HCC)   Essential hypertension   1. Acute blood loss anemia due to lower GO bleed, diverticular bleed. Patient with no bowel movement since admission, her Hgb is down to 7,8.  No abdominal pain.   Check Hgb at 16;00, if below 7, will plan for PRBC transfusion. If persistent bleeding patient will need a bleeding scan and surgery consultation.   Continue clear liquids for now.   2. Polycystic renal disease, sp renal transplant. Stable renal function with serum cr at 1,31, K is 4,2 and bicarbonate is 26.  Continue to follow up on renal function in am, continue with furosemide per home regimen.   Continue with prednisone, tacrolimus and mycophenolate.  Prophylactic with valacyclovir.   3. Dyslipidemia. Not on satin therapy    4. HTN. Continue blood pressure control with isosorbide and verapamil.   5. Chronic diastolic heart failure.   6. Iron deficiency anemia. Continue oral iron supplementation.   7. COPD. Continue with bronchodilator therapy/ dulera.   Patient continue to be at high risk for worsening anemia.   Status is: Observation  The patient remains OBS appropriate and will d/c before 2 midnights.  Dispo: The patient is from: Home              Anticipated d/c is to: Home              Anticipated d/c date is: 1 day              Patient currently is not medically stable to d/c.  DVT prophylaxis: scd   Code Status:   full  Family Communication:  No family at the bedside      Subjective: Patient with no bowel movement, tolerating po well,. No nausea or vomiting, no chest pain or dyspnea. Positive lower extremity edema,   Objective: Vitals:   09/26/20 0139 09/26/20 0605 09/26/20 0936 09/26/20 1254  BP: (!) 158/69 (!) 161/76 (!) 134/57 (!) 111/56  Pulse: 86 91 91 95  Resp: 18 20 18 16   Temp: 97.7 F (36.5 C) 97.8 F (36.6 C) 98.2 F (36.8 C) 98.1 F (36.7 C)  TempSrc: Oral Oral Oral Oral  SpO2: 95% 92% (!) 89% 90%  Weight:      Height:        Intake/Output Summary (Last 24 hours) at 09/26/2020 1427 Last data filed at 09/26/2020 1000 Gross per 24 hour  Intake  0 ml  Output --  Net 0 ml   Filed Weights   09/25/20 2044 09/25/20 2108  Weight: 73 kg 74.8 kg    Examination:   General: Not in pain or dyspnea,. Deconditioned  Neurology: Awake and alert, non focal  E UXL:KGMW pallor, no icterus, oral mucosa moist Cardiovascular: No JVD. S1-S2 present, rhythmic, no gallops, rubs, or murmurs. No lower extremity edema. Pulmonary: positive breath sounds bilaterally, with no wheezing, rhonchi or rales. Gastrointestinal. Abdomen soft and non tender Skin. No rashes Musculoskeletal: no joint deformities     Data Reviewed: I have personally reviewed following labs and imaging  studies  CBC: Recent Labs  Lab 09/25/20 2053 09/26/20 0547  WBC 4.7 4.1  HGB 8.5* 7.8*  HCT 29.2* 26.7*  MCV 109.4* 109.0*  PLT 185 102   Basic Metabolic Panel: Recent Labs  Lab 09/25/20 2053 09/26/20 0547  NA 138 141  K 4.3 4.2  CL 103 107  CO2 26 26  GLUCOSE 160* 94  BUN 49* 44*  CREATININE 1.44* 1.31*  CALCIUM 9.5 9.3   GFR: Estimated Creatinine Clearance: 40.7 mL/min (A) (by C-G formula based on SCr of 1.31 mg/dL (H)). Liver Function Tests: Recent Labs  Lab 09/25/20 2053  AST 14*  ALT 10  ALKPHOS 73  BILITOT 0.9  PROT 8.1  ALBUMIN 3.2*   No results for input(s): LIPASE, AMYLASE in the last 168 hours. No results for input(s): AMMONIA in the last 168 hours. Coagulation Profile: Recent Labs  Lab 09/26/20 0547  INR 1.1   Cardiac Enzymes: No results for input(s): CKTOTAL, CKMB, CKMBINDEX, TROPONINI in the last 168 hours. BNP (last 3 results) No results for input(s): PROBNP in the last 8760 hours. HbA1C: No results for input(s): HGBA1C in the last 72 hours. CBG: No results for input(s): GLUCAP in the last 168 hours. Lipid Profile: No results for input(s): CHOL, HDL, LDLCALC, TRIG, CHOLHDL, LDLDIRECT in the last 72 hours. Thyroid Function Tests: No results for input(s): TSH, T4TOTAL, FREET4, T3FREE, THYROIDAB in the last 72 hours. Anemia Panel: No results for input(s): VITAMINB12, FOLATE, FERRITIN, TIBC, IRON, RETICCTPCT in the last 72 hours.    Radiology Studies: I have reviewed all of the imaging during this hospital visit personally     Scheduled Meds:  allopurinol  100 mg Oral Daily   ascorbic acid  500 mg Oral Daily   cholecalciferol  1,000 Units Oral Daily   ferrous sulfate  325 mg Oral Q breakfast   FLUoxetine  40 mg Oral Daily   furosemide  80 mg Oral BID   gabapentin  200 mg Oral TID   isosorbide mononitrate  60 mg Oral Daily   mometasone-formoterol  2 puff Inhalation BID   mycophenolate  180 mg Oral BID   pantoprazole   40 mg Oral BID   predniSONE  7.5 mg Oral Q breakfast   tacrolimus  1 mg Oral q1800   tacrolimus  2 mg Oral Daily   umeclidinium bromide  1 puff Inhalation Daily   valACYclovir  500 mg Oral BID   verapamil  240 mg Oral Daily   Continuous Infusions:   LOS: 0 days        April Carline Gerome Apley, MD

## 2020-09-26 NOTE — H&P (Signed)
History and Physical    April [name] GBT:[id number] DOB: 10/14/1956 DOA: 09/25/2020  PCP: [name], MD (Confirm with patient/family/NH records and if not entered, this has to be entered at [facility] point of entry) Patient coming from: Home  I have personally briefly reviewed patient's old medical records in [place]  Chief Complaint: Rectal bleeding  HPI: April [name] is a 64 y.o. female with medical history significant  of hypertension, hyperlipidemia, polycystic kidney disease, COPD, depression, diverticular bleed presented to ED for evaluation of bright red blood per rectum.  Patient states that she had 3 episodes of bright red blood in her stools today and it was totally painless.  Patient states that she has had a history of diverticulosis and previous episodes of painless bleeding and recently she was managed for acute diverticulitis.  Patient is not on any blood thinner.  Patient denies fever, chills, abdominal pain, nausea, vomiting, diarrhea or constipation. (For level 3, the HPI must include 4+ descriptors: Location, Quality, Severity, Duration, Timing, Context, modifying factors, associated signs/symptoms and/or status of 3+ chronic problems.)  (Please avoid self-populating past medical history here) (The initial 2-3 lines should be focused and good to copy and paste in the HPI section of the daily progress note).  ED Course: On arrival to ED patient had temperature of 97.8, heart rate 87, respiratory rate 18, blood pressure 133/69 and oxygen saturation 94% on room air.  Hemoglobin 8.5 that is close to the baseline.  BUN 49, creatinine 1.4, blood glucose 160.  Stool guaiac was positive.  Patient is admitted for monitoring hemoglobin.  Review of Systems: As per HPI otherwise 10 point review of systems negative.  Unacceptable ROS statements: 10 systems reviewed, Extensive (without elaboration).  Acceptable ROS statements: All others negative, All others reviewed  and are negative, and All others unremarkable, with at [place] documented Cant double dip - if using for HPI cant use for ROS  Past Medical History:  Diagnosis Date   Arthritis    Asthma    CHF (congestive heart failure) (HCC)    COPD (chronic obstructive pulmonary disease) ([place])    Depression    Essential hypertension 02/08/2020   GERD (gastroesophageal reflux disease)    Hyperlipidemia    Hyperparathyroidism    Polycystic kidney    PONV (postoperative nausea and vomiting)     Past Surgical History:  Procedure Laterality Date   AV FISTULA PLACEMENT  10-21-2010   left Brachiocephalic AVF   BILATERAL OOPHORECTOMY     BIOPSY  05/19/2020   Procedure: BIOPSY;  Surgeon: Carol Ada, MD;  Location: [place];  Service: Endoscopy;;   CARPAL TUNNEL RELEASE  2000   CESAREAN SECTION     COLONOSCOPY WITH PROPOFOL N/A 05/19/2020   Procedure: COLONOSCOPY WITH PROPOFOL;  Surgeon: Carol Ada, MD;  Location: [place];  Service: Endoscopy;  Laterality: N/A;   ENTEROSCOPY N/A 05/19/2020   Procedure: ENTEROSCOPY;  Surgeon: Carol Ada, MD;  Location: [place];  Service: Endoscopy;  Laterality: N/A;   INSERTION OF DIALYSIS CATHETER  03/31/2012   Procedure: INSERTION OF DIALYSIS CATHETER;  Surgeon: Angelia Mould, MD;  Location: [place];  Service: Vascular;  Laterality: N/A;  insertion of dialysis catheter right internal jugular vein   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   ORIF TIBIA FRACTURE Left 12/23/2018   Procedure: OPEN REDUCTION INTERNAL FIXATION (ORIF) TIBIA FRACTURE;  Surgeon: Shona Needles, MD;  Location: [place];  Service: Orthopedics;  Laterality: Left;  ORIF TIBIA PLATEAU Right 12/23/2018   Procedure: OPEN REDUCTION INTERNAL FIXATION (ORIF) TIBIAL PLATEAU;  Surgeon: Shona Needles, MD;  Location: [place];  Service: Orthopedics;  Laterality: Right;   ORIF WRIST FRACTURE Left 12/08/2019   Procedure: OPEN REDUCTION INTERNAL FIXATION (ORIF)  WRIST FRACTURE, REPAIR LACERATION LEFT FOREARM;  Surgeon: Dorna Leitz, MD;  Location: [facility];  Service: Orthopedics;  Laterality: Left;   TONSILLECTOMY  1967   TOTAL HIP ARTHROPLASTY Left 12/08/2019   Procedure: TOTAL HIP ARTHROPLASTY ANTERIOR APPROACH;  Surgeon: Dorna Leitz, MD;  Location: [facility];  Service: Orthopedics;  Laterality: Left;   TUBAL LIGATION  2010     reports that she quit smoking about 22 years ago. Her smoking use included cigarettes. She has a 3.75 pack-year smoking history. She has never used smokeless tobacco. She reports that she does not drink alcohol and does not use drugs.  Allergies  Allergen Reactions   Infed [Iron Dextran] Other (See Comments)    Chest tightness   Pentamidine Itching, Shortness Of Breath and Swelling   Erythromycin [Erythromycin] Other (See Comments)    Mouth Ulcers   Iohexol Other (See Comments)    Per patient "she has had a kidney transplant and should never have contrast"   Oxycodone Nausea Only and Nausea And Vomiting   Erythromycin Rash    Causes breakout in mouth   Ultram [Tramadol Hcl] Anxiety    Family History  Problem Relation Age of Onset   Hypertension Mother    Heart disease Father        CABG history   Polycystic kidney disease Father    Polycystic kidney disease Brother     Unacceptable: Noncontributory, unremarkable, or negative. Acceptable: (example)Family history negative for heart disease  Prior to Admission medications   Medication Sig Start Date End Date Taking? Authorizing Provider  acetaminophen (TYLENOL) 325 MG tablet Take 2 tablets (650 mg total) by mouth every 6 (six) hours as needed for mild pain (or Fever >/= 101). 05/22/20  Yes Elgergawy, Silver Huguenin, MD  albuterol (VENTOLIN HFA) 108 (90 Base) MCG/ACT inhaler Inhale 2 puffs into the lungs every 4 (four) hours as needed for wheezing or shortness of breath. 01/04/20  Yes Angiulli, Lavon Paganini, PA-C  allopurinol (ZYLOPRIM) 100 MG tablet Take 1 tablet  (100 mg total) by mouth daily. 09/12/20 10/12/20 Yes Elodia Florence., MD  ascorbic acid (VITAMIN C) 500 MG tablet Take 500 mg by mouth daily.   Yes [provider]  aspirin 81 MG chewable tablet Chew 81 mg by mouth daily.   Yes [provider]  brimonidine (ALPHAGAN P) 0.1 % SOLN Place 1 drop into both eyes 2 (two) times daily.    Yes [provider]  Budeson-Glycopyrrol-Formoterol (BREZTRI AEROSPHERE) 160-9-4.8 MCG/ACT AERO Inhale 2 puffs into the lungs 2 (two) times daily. 08/20/20  Yes Martyn Ehrich, NP  cholecalciferol (VITAMIN D3) 25 MCG (1000 UNIT) tablet Take 1 tablet (1,000 Units total) by mouth daily. 01/04/20  Yes Angiulli, Lavon Paganini, PA-C  cyanocobalamin 1000 MCG tablet Take 1,000 mcg by mouth daily.   Yes [provider]  cycloSPORINE (RESTASIS) 0.05 % ophthalmic emulsion Place 1 drop into both eyes 2 (two) times daily. 01/04/20  Yes Angiulli, Lavon Paganini, PA-C  dorzolamide (TRUSOPT) 2 % ophthalmic solution Place 1 drop into both eyes daily. 06/25/19  Yes [provider]  ferrous sulfate 325 (65 FE) MG tablet Take 325 mg by mouth daily with breakfast.   Yes [provider]  FLUoxetine (PROZAC) 40 MG capsule Take 1 capsule (40 mg total) by mouth daily. 01/04/20  Yes Angiulli, Lavon Paganini, PA-C  furosemide (LASIX) 80 MG tablet Take 80 mg by mouth 2 (two) times daily.  09/17/20  Yes [provider]  gabapentin (NEURONTIN) 100 MG capsule Take 200 mg by mouth 3 (three) times daily.   Yes [provider]  ipratropium-albuterol (DUONEB) 0.5-2.5 (3) MG/3ML SOLN Take 3 mLs by nebulization every 6 (six) hours as needed (sob/wheezing).  08/24/20  Yes [provider]  isosorbide mononitrate (IMDUR) 60 MG 24 hr tablet Take 1 tablet (60 mg total) by mouth daily. 05/23/20  Yes Elgergawy, Silver Huguenin, MD  levalbuterol (XOPENEX) 0.63 MG/3ML nebulizer solution Take 0.63 mg by nebulization every 6 (six) hours as needed for wheezing or  shortness of breath. Inhale 0.63mg  once daily.  May take 0.63mg  every six hours as needed COPD.   Yes [provider]  magnesium oxide (MAG-OX) 400 MG tablet Take 1 tablet (400 mg total) by mouth 2 (two) times daily. 01/04/20  Yes Angiulli, Lavon Paganini, PA-C  mycophenolate (MYFORTIC) 180 MG EC tablet Take 180 mg by mouth 2 (two) times daily.   Yes [provider]  oxyCODONE (ROXICODONE) 5 MG immediate release tablet Take 1 tablet (5 mg total) by mouth every 6 (six) hours as needed for up to 15 doses for breakthrough pain. 09/02/20  Yes Curatolo, Adam, DO  pantoprazole (PROTONIX) 40 MG tablet Take 1 tablet (40 mg total) by mouth 2 (two) times daily. 01/04/20  Yes Angiulli, Lavon Paganini, PA-C  predniSONE (DELTASONE) 5 MG tablet Take 7.5 mg by mouth daily with breakfast.   Yes [provider]  PROGRAF 1 MG capsule Take 1-2 mg by mouth daily. 2mg  in the morning 1mg  in the afternoon 03/22/19  Yes [provider]  promethazine (PHENERGAN) 25 MG tablet Take 1 tablet (25 mg total) by mouth every 8 (eight) hours as needed for up to 15 doses for nausea or vomiting. 09/02/20  Yes Curatolo, Adam, DO  TRAVATAN Z 0.004 % SOLN ophthalmic solution Place 1 drop into both eyes at bedtime. 07/03/16  Yes [provider]  valACYclovir (VALTREX) 500 MG tablet Take 500 mg by mouth 2 (two) times daily.   Yes [provider]  verapamil (CALAN-SR) 240 MG CR tablet Take 240 mg by mouth daily.   Yes [provider]  Budeson-Glycopyrrol-Formoterol (BREZTRI AEROSPHERE) 160-9-4.8 MCG/ACT AERO Inhale 2 puffs into the lungs in the morning and at bedtime. Patient not taking: Reported on 09/25/2020 08/20/20   Martyn Ehrich, NP  Multiple Vitamin (MULTIVITAMIN WITH MINERALS) TABS tablet Take 1 tablet by mouth daily. Patient not taking: Reported on 09/04/2020 02/24/20   Alma Friendly, MD    Physical Exam: Vitals:   09/26/20 0000 09/26/20 0006 09/26/20 0110 09/26/20 0139   BP: 139/71 139/71 (!) 160/82 (!) 158/69  Pulse: 78 77 80 86  Resp:  17 18 18   Temp:    97.7 F (36.5 C)  TempSrc:    Oral  SpO2: 94% 96% 95% 95%  Weight:      Height:        Constitutional: NAD, calm, comfortable Vitals:   09/26/20 0000 09/26/20 0006 09/26/20 0110 09/26/20 0139  BP: 139/71 139/71 (!) 160/82 (!) 158/69  Pulse: 78 77 80 86  Resp:  17 18 18   Temp:    97.7 F (36.5 C)  TempSrc:    Oral  SpO2: 94% 96% 95%  95%  Weight:      Height:       Eyes: PERRL, lids and conjunctivae normal ENMT: Mucous membranes are moist. Posterior pharynx clear of any exudate or lesions.Normal dentition.  Neck: normal, supple, no masses, no thyromegaly Respiratory: clear to auscultation bilaterally, no wheezing, no crackles. Normal respiratory effort. No accessory muscle use.  Cardiovascular: Regular rate and rhythm, no murmurs / rubs / gallops.  2+ bilateral lower extremity edema. 2+ pedal pulses. No carotid bruits.  Abdomen: no tenderness, no masses palpated. No hepatosplenomegaly. Bowel sounds positive.  Musculoskeletal: no clubbing / cyanosis. No joint deformity upper and lower extremities. Good ROM, no contractures. Normal muscle tone.  Skin: no rashes, lesions, ulcers. No induration Neurologic: CN 2-12 grossly intact. Sensation intact, DTR normal. Strength 5/5 in all 4.  Psychiatric: Normal judgment and insight. Alert and oriented x 3. Normal mood.   (Anything < 9 systems with 2 bullets each down codes to level 1) (If patient refuses exam cant bill higher level) (Make sure to document decubitus ulcers present on admission -- if possible -- and whether patient has chronic indwelling catheter at time of admission)  Labs on Admission: I have personally reviewed following labs and imaging studies  CBC: Recent Labs  Lab 09/25/20 2053  WBC 4.7  HGB 8.5*  HCT 29.2*  MCV 109.4*  PLT 683   Basic Metabolic Panel: Recent Labs  Lab 09/25/20 2053  NA 138  K 4.3  CL 103  CO2 26   GLUCOSE 160*  BUN 49*  CREATININE 1.44*  CALCIUM 9.5   GFR: Estimated Creatinine Clearance: 37 mL/min (A) (by C-G formula based on SCr of 1.44 mg/dL (H)). Liver Function Tests: Recent Labs  Lab 09/25/20 2053  AST 14*  ALT 10  ALKPHOS 73  BILITOT 0.9  PROT 8.1  ALBUMIN 3.2*   No results for input(s): LIPASE, AMYLASE in the last 168 hours. No results for input(s): AMMONIA in the last 168 hours. Coagulation Profile: No results for input(s): INR, PROTIME in the last 168 hours. Cardiac Enzymes: No results for input(s): CKTOTAL, CKMB, CKMBINDEX, TROPONINI in the last 168 hours. BNP (last 3 results) No results for input(s): PROBNP in the last 8760 hours. HbA1C: No results for input(s): HGBA1C in the last 72 hours. CBG: No results for input(s): GLUCAP in the last 168 hours. Lipid Profile: No results for input(s): CHOL, HDL, LDLCALC, TRIG, CHOLHDL, LDLDIRECT in the last 72 hours. Thyroid Function Tests: No results for input(s): TSH, T4TOTAL, FREET4, T3FREE, THYROIDAB in the last 72 hours. Anemia Panel: No results for input(s): VITAMINB12, FOLATE, FERRITIN, TIBC, IRON, RETICCTPCT in the last 72 hours. Urine analysis:    Component Value Date/Time   COLORURINE YELLOW 09/08/2020 1711   APPEARANCEUR CLEAR 09/08/2020 1711   LABSPEC 1.011 09/08/2020 1711   PHURINE 5.0 09/08/2020 1711   GLUCOSEU NEGATIVE 09/08/2020 1711   HGBUR SMALL (A) 09/08/2020 1711   BILIRUBINUR NEGATIVE 09/08/2020 1711   KETONESUR NEGATIVE 09/08/2020 1711   PROTEINUR NEGATIVE 09/08/2020 1711   UROBILINOGEN 0.2 05/15/2013 0733   NITRITE NEGATIVE 09/08/2020 1711   LEUKOCYTESUR NEGATIVE 09/08/2020 1711    Radiological Exams on Admission: No results found.  EKG: Independently reviewed.   Assessment/Plan Principal Problem:   Lower GI bleeding Patient had 3 episodes of bright red painless bleeding from rectum.  Hemoglobin is stable.  Patient is not on any blood thinner.  No chemical prophylaxis for DVT  because of acute GI bleed.  Hemoglobin is 8.5 and stable at  baseline.  Continue to monitor CBC.  Consider consulting GI if bleeding continue.  Active Problems:   Renal transplant recipient Continue home immunosuppression medications.       Chronic diastolic CHF (congestive heart failure) (HCC) Not in acute exacerbation.  Patient had 2+ edema in bilateral lower extremities.  Continue home Lasix.    Essential hypertension Stable.  Continue home medications.  Continue to monitor   DVT prophylaxis: No chemical prophylaxis because of GI bleed. Code Status: Full code Family Communication: No family member at bedside. Disposition Plan:  Consults called: Consider GI consult if hemoglobin drop or further GI bleed Admission status: Observation   Edmonia Lynch MD [facility] Pager [phone]  If 7PM-7AM, please contact night-coverage [email or web address] Password [facility]  09/26/2020, 5:27 AM

## 2020-09-27 DIAGNOSIS — I10 Essential (primary) hypertension: Secondary | ICD-10-CM | POA: Diagnosis not present

## 2020-09-27 DIAGNOSIS — E785 Hyperlipidemia, unspecified: Secondary | ICD-10-CM | POA: Diagnosis not present

## 2020-09-27 DIAGNOSIS — K922 Gastrointestinal hemorrhage, unspecified: Secondary | ICD-10-CM | POA: Diagnosis not present

## 2020-09-27 DIAGNOSIS — I5032 Chronic diastolic (congestive) heart failure: Secondary | ICD-10-CM | POA: Diagnosis not present

## 2020-09-27 LAB — BASIC METABOLIC PANEL
Anion gap: 10 (ref 5–15)
BUN: 37 mg/dL — ABNORMAL HIGH (ref 8–23)
CO2: 28 mmol/L (ref 22–32)
Calcium: 9.2 mg/dL (ref 8.9–10.3)
Chloride: 101 mmol/L (ref 98–111)
Creatinine, Ser: 1.51 mg/dL — ABNORMAL HIGH (ref 0.44–1.00)
GFR, Estimated: 39 mL/min — ABNORMAL LOW (ref >60)
Glucose, Bld: 99 mg/dL (ref 70–99)
Potassium: 4 mmol/L (ref 3.5–5.1)
Sodium: 139 mmol/L (ref 135–145)

## 2020-09-27 LAB — CBC WITH DIFFERENTIAL/PLATELET
Abs Immature Granulocytes: 0.02 10*3/uL (ref 0.00–0.07)
Basophils Absolute: 0 10*3/uL (ref 0.0–0.1)
Basophils Relative: 1 %
Eosinophils Absolute: 0.1 10*3/uL (ref 0.0–0.5)
Eosinophils Relative: 2 %
HCT: 26.9 % — ABNORMAL LOW (ref 36.0–46.0)
Hemoglobin: 7.9 g/dL — ABNORMAL LOW (ref 12.0–15.0)
Immature Granulocytes: 1 %
Lymphocytes Relative: 29 %
Lymphs Abs: 1.2 10*3/uL (ref 0.7–4.0)
MCH: 31.6 pg (ref 26.0–34.0)
MCHC: 29.4 g/dL — ABNORMAL LOW (ref 30.0–36.0)
MCV: 107.6 fL — ABNORMAL HIGH (ref 80.0–100.0)
Monocytes Absolute: 0.3 10*3/uL (ref 0.1–1.0)
Monocytes Relative: 6 %
Neutro Abs: 2.6 10*3/uL (ref 1.7–7.7)
Neutrophils Relative %: 61 %
Platelets: 184 10*3/uL (ref 150–400)
RBC: 2.5 MIL/uL — ABNORMAL LOW (ref 3.87–5.11)
RDW: 20.4 % — ABNORMAL HIGH (ref 11.5–15.5)
WBC: 4.2 10*3/uL (ref 4.0–10.5)
nRBC: 0 % (ref 0.0–0.2)

## 2020-09-27 MED ORDER — POLYETHYLENE GLYCOL 3350 17 G PO PACK
17.0000 g | PACK | Freq: Every day | ORAL | Status: DC
  Administered 2020-09-27: 17 g via ORAL
  Filled 2020-09-27: qty 1

## 2020-09-27 NOTE — Discharge Summary (Signed)
Physician Discharge Summary  April [name] QAS:[id number] DOB: [birth year] DOA: 09/25/2020  PCP: [name], MD  Admit date: 09/25/2020 Discharge date: 09/27/2020  Admitted From: Home  Disposition:  Home   Recommendations for Outpatient Follow-up and new medication changes:  1. Follow up with Dr. Leonides Schanz in 7 days.  2. Follow with Gastroenterology as scheduled with Dr. Collene Mares 3. Hold on aspirin   Home Health: no   Equipment/Devices: no    Discharge Condition: stable  CODE STATUS: full  Diet recommendation: heart healthy   Brief/Interim Summary: [name] was admitted to the hospital with a working diagnosis of acute blood loss anemia due to lower GI bleed, diverticular bleed.  64 year old female past medical history for hypertension, dyslipidemia, COPD, depression, polycystic kidney disease sp renal transplant and diverticulosis with history of diverticular bleed in the past.  She reported 3 episodes of bright red blood per rectum, no associated abdominal pain.  On her initial physical examination blood pressure was 133/69, heart rate 87, respiratory rate 18, temperature 97.8, oxygen saturation 94%.  Her lungs were clear to auscultation bilaterally, heart S1-S2, present rhythmic, soft abdomen, no lower extremity edema. Sodium 134, potassium 4.3, chloride 103, bicarb 26, glucose 160, BUN 49, creatinine 1.44, white cell count 4.7, hemoglobin 8.5, hematocrit 29.2, platelets 185.  SARS COVID-19 negative.  Aspirin was held, she had no more bleeding, after a drop in her hemoglobin it remained stable at 7,8 and 7,9.   1. Acute blood loss anemia due to lower GI bleed, diverticular bleed.  She remained hemodynamically stable, serial hemoglobin hematocrit checks showed initial drop down to 7.8 then has remained stable, 7.8 -7.9.  No further signs of bleeding, aspirin will be held, patient will follow up with gastroenterology as an outpatient.  2.  Polycystic renal disease status post  renal transplant, chronic kidney disease stage 3a-b. Patient was continued on furosemide for volume control.  Her baseline creatinine seems to be around 1.5.  Continue immunosuppressive regime with prednisone, prograf and mycophenolate.   Follow-up kidney function as an outpatient.  3.  Dyslipidemia.  Currently not on statin therapy.  4.  Hypertension.  Continue blood pressure control with isosorbide and verapamil.  5.  Chronic diastolic heart failure.  No signs of acute exacerbation.  6.  Iron deficiency anemia.  Continue iron supplementation.  7.  COPD.  Continue bronchodilator therapy and Dulera.  8. Depression. Continue with fluoxetine.   Discharge Diagnoses:  Principal Problem:   Lower GI bleeding Active Problems:   Renal transplant recipient   Hyperlipidemia   Chronic diastolic CHF (congestive heart failure) ([place])   Essential hypertension    Discharge Instructions   Allergies as of 09/27/2020      Reactions   Infed [iron Dextran] Other (See Comments)   Chest tightness   Pentamidine Itching, Shortness Of Breath, Swelling   Erythromycin [erythromycin] Other (See Comments)   Mouth Ulcers   Iohexol Other (See Comments)   Per patient "she has had a kidney transplant and should never have contrast"   Oxycodone Nausea Only, Nausea And Vomiting   Erythromycin Rash   Causes breakout in mouth   Ultram [tramadol Hcl] Anxiety      Medication List    STOP taking these medications   aspirin 81 MG chewable tablet   multivitamin with minerals Tabs tablet     TAKE these medications   acetaminophen 325 MG tablet Commonly known as: TYLENOL Take 2 tablets (650 mg total) by mouth every 6 (six) hours as  needed for mild pain (or Fever >/= 101).   albuterol 108 (90 Base) MCG/ACT inhaler Commonly known as: VENTOLIN HFA Inhale 2 puffs into the lungs every 4 (four) hours as needed for wheezing or shortness of breath.   allopurinol 100 MG tablet Commonly known as:  ZYLOPRIM Take 1 tablet (100 mg total) by mouth daily.   ascorbic acid 500 MG tablet Commonly known as: VITAMIN C Take 500 mg by mouth daily.   Breztri Aerosphere 160-9-4.8 MCG/ACT Aero Generic drug: Budeson-Glycopyrrol-Formoterol Inhale 2 puffs into the lungs 2 (two) times daily. What changed: Another medication with the same name was removed. Continue taking this medication, and follow the directions you see here.   brimonidine 0.1 % Soln Commonly known as: ALPHAGAN P Place 1 drop into both eyes 2 (two) times daily.   cholecalciferol 25 MCG (1000 UNIT) tablet Commonly known as: VITAMIN D3 Take 1 tablet (1,000 Units total) by mouth daily.   cyanocobalamin 1000 MCG tablet Take 1,000 mcg by mouth daily.   cycloSPORINE 0.05 % ophthalmic emulsion Commonly known as: RESTASIS Place 1 drop into both eyes 2 (two) times daily.   dorzolamide 2 % ophthalmic solution Commonly known as: TRUSOPT Place 1 drop into both eyes daily.   ferrous sulfate 325 (65 FE) MG tablet Take 325 mg by mouth daily with breakfast.   FLUoxetine 40 MG capsule Commonly known as: PROZAC Take 1 capsule (40 mg total) by mouth daily.   furosemide 80 MG tablet Commonly known as: LASIX Take 80 mg by mouth 2 (two) times daily.   gabapentin 100 MG capsule Commonly known as: NEURONTIN Take 200 mg by mouth 3 (three) times daily.   ipratropium-albuterol 0.5-2.5 (3) MG/3ML Soln Commonly known as: DUONEB Take 3 mLs by nebulization every 6 (six) hours as needed (sob/wheezing).   isosorbide mononitrate 60 MG 24 hr tablet Commonly known as: IMDUR Take 1 tablet (60 mg total) by mouth daily.   levalbuterol 0.63 MG/3ML nebulizer solution Commonly known as: XOPENEX Take 0.63 mg by nebulization every 6 (six) hours as needed for wheezing or shortness of breath. Inhale 0.63mg  once daily.  May take 0.63mg  every six hours as needed COPD.   magnesium oxide 400 MG tablet Commonly known as: MAG-OX Take 1 tablet (400 mg  total) by mouth 2 (two) times daily.   mycophenolate 180 MG EC tablet Commonly known as: MYFORTIC Take 180 mg by mouth 2 (two) times daily.   oxyCODONE 5 MG immediate release tablet Commonly known as: Roxicodone Take 1 tablet (5 mg total) by mouth every 6 (six) hours as needed for up to 15 doses for breakthrough pain.   pantoprazole 40 MG tablet Commonly known as: PROTONIX Take 1 tablet (40 mg total) by mouth 2 (two) times daily.   predniSONE 5 MG tablet Commonly known as: DELTASONE Take 7.5 mg by mouth daily with breakfast.   Prograf 1 MG capsule Generic drug: tacrolimus Take 1-2 mg by mouth daily. 2mg  in the morning 1mg  in the afternoon   promethazine 25 MG tablet Commonly known as: PHENERGAN Take 1 tablet (25 mg total) by mouth every 8 (eight) hours as needed for up to 15 doses for nausea or vomiting.   Travatan Z 0.004 % Soln ophthalmic solution Generic drug: Travoprost (BAK Free) Place 1 drop into both eyes at bedtime.   valACYclovir 500 MG tablet Commonly known as: VALTREX Take 500 mg by mouth 2 (two) times daily.   verapamil 240 MG CR tablet Commonly known as: CALAN-SR Take  240 mg by mouth daily.       Allergies  Allergen Reactions  . Infed [Iron Dextran] Other (See Comments)    Chest tightness  . Pentamidine Itching, Shortness Of Breath and Swelling  . Erythromycin [Erythromycin] Other (See Comments)    Mouth Ulcers  . Iohexol Other (See Comments)    Per patient "she has had a kidney transplant and should never have contrast"  . Oxycodone Nausea Only and Nausea And Vomiting  . Erythromycin Rash    Causes breakout in mouth  . Ultram [Tramadol Hcl] Anxiety        Procedures/Studies: CT ABDOMEN PELVIS WO CONTRAST  Result Date: 09/04/2020 CLINICAL DATA:  Left lower quadrant abdominal pain.  Dyspnea. EXAM: CT ABDOMEN AND PELVIS WITHOUT CONTRAST TECHNIQUE: Multidetector CT imaging of the abdomen and pelvis was performed following the standard  protocol without IV contrast. COMPARISON:  09/02/2020 CT abdomen/pelvis. FINDINGS: Limited motion degraded scan. Lower chest: Trace dependent bilateral pleural effusions with mild-to-moderate bibasilar atelectasis, increased. Cardiomegaly. Trace pericardial effusion/thickening, unchanged. Hepatobiliary: Normal liver size. Numerous simple cysts scattered throughout the liver, largest 3.4 cm in the left liver lobe. Numerous subcentimeter hypodense lesions scattered throughout the liver are too small to characterize and are not appreciably changed. No appreciable new liver lesions. Normal gallbladder with no radiopaque cholelithiasis. No biliary ductal dilatation. Pancreas: Normal, with no mass or duct dilation. Spleen: Mild splenomegaly. Craniocaudal splenic length 13.6 cm, stable. No splenic mass. Adrenals/Urinary Tract: Normal adrenals. Prominently enlarged polycystic kidneys bilaterally. No hydronephrosis. No renal stones. Several of the renal cysts demonstrate mural calcifications in the kidneys bilaterally, unchanged. Dominant exophytic 7.1 cm lateral interpolar right renal cyst and dominant 7.6 cm lower left renal cyst. No overtly suspicious renal masses on this noncontrast scan. Right lower quadrant transplant kidney demonstrates no hydronephrosis, no renal stones, no contour deforming renal masses and no perinephric collections. Bladder is nondistended and is obscured by streak artifact from left hip hardware with no gross bladder abnormality. Stomach/Bowel: Normal non-distended stomach. Normal caliber small bowel with no small bowel wall thickening. Normal appendix. Moderate sigmoid diverticulosis. Mild wall thickening with associated mild pericolonic fat stranding in the mid sigmoid colon compatible with acute sigmoid diverticulitis, slightly improved compared to the CT study performed 2 days prior. No pericolonic free air or abscess. Vascular/Lymphatic: Atherosclerotic nonaneurysmal abdominal aorta. No  pathologically enlarged lymph nodes in the abdomen or pelvis. Reproductive: Grossly normal uterus.  No adnexal mass. Other: No pneumoperitoneum. Small simple 2.5 x 1.8 cm cystic focus associated with an apparent small left inguinal hernia, unchanged (series 3/image 87). Small to moderate fat containing right periumbilical hernia is unchanged. Musculoskeletal: No aggressive appearing focal osseous lesions. Left total hip arthroplasty. Healed deformities in the left pubic rami. Mild thoracolumbar spondylosis. IMPRESSION: 1. Acute sigmoid diverticulitis, slightly improved compared to the CT study performed 2 days prior. No free air or abscess. 2. Trace dependent bilateral pleural effusions with mild-to-moderate bibasilar atelectasis, increased. 3. Cardiomegaly. Trace pericardial effusion/thickening, unchanged. 4. Prominently enlarged polycystic kidneys bilaterally, unchanged. No hydronephrosis. Right lower quadrant transplant kidney is unremarkable. 5. Stable mild splenomegaly. 6. Stable small simple 2.5 cm cystic focus associated with an apparent small left inguinal hernia. 7. Stable small to moderate fat containing right periumbilical hernia. 8. Aortic Atherosclerosis (ICD10-I70.0). Electronically Signed   By: Ilona Sorrel M.D.   On: 09/04/2020 16:27   CT ABDOMEN PELVIS WO CONTRAST  Result Date: 09/02/2020 CLINICAL DATA:  64 year old female with concern for bowel obstruction secondary to umbilical hernia.  EXAM: CT ABDOMEN AND PELVIS WITHOUT CONTRAST TECHNIQUE: Multidetector CT imaging of the abdomen and pelvis was performed following the standard protocol without IV contrast. COMPARISON:  CT abdomen pelvis dated 05/14/2020. FINDINGS: Evaluation of this exam is limited in the absence of intravenous contrast. Lower chest: There are bibasilar linear atelectasis/scarring. There is trace right pleural effusion. Coronary vascular calcifications noted. No intra-abdominal free air or free fluid. Hepatobiliary: Multiple  liver cysts as seen on the prior CT. No intrahepatic biliary ductal dilatation. There is sludge in the gallbladder. No pericholecystic fluid. Pancreas: Unremarkable. No pancreatic ductal dilatation or surrounding inflammatory changes. Spleen: Normal in size without focal abnormality. Adrenals/Urinary Tract: The adrenal glands are unremarkable. Polycystic kidneys with replaced renal parenchyma by innumerable cysts. Scattered parenchymal calcifications noted. There is no hydronephrosis or obstructing stone. There is a right lower quadrant renal transplant. There is no hydronephrosis or nephrolithiasis of the transplant kidney. No peritransplant fluid collection. The urinary bladder is unremarkable. Stomach/Bowel: There is sigmoid diverticulosis. There is inflammatory changes centered at a sigmoid diverticula consistent with acute diverticulitis. There is no bowel obstruction. Normal appendix. Vascular/Lymphatic: Moderate aortoiliac atherosclerotic disease. The IVC is unremarkable. No portal venous gas. There is no adenopathy. Reproductive: The uterus is retroflexed. No adnexal masses. Other: There is a small fat containing umbilical hernia. There is mild stranding of the herniated fat, likely chronic. Correlation with clinical exam and point tenderness recommended to exclude strangulation/incarceration. No fluid collection. Musculoskeletal: Degenerative changes of the spine. Old left pubic bone fracture. Left hip arthroplasty. No acute osseous pathology. Bilateral sacral al a insufficiency fractures similar to prior CT. IMPRESSION: 1. Sigmoid diverticulitis.  No diverticular abscess or perforation. 2. No bowel obstruction. Normal appendix. 3. Small fat containing umbilical hernia with mild stranding of the herniated fat, likely chronic. Correlation with clinical exam and point tenderness recommended to exclude strangulation/incarceration. 4. Polycystic kidneys and liver. 5. Aortic Atherosclerosis (ICD10-I70.0).  Electronically Signed   By: Anner Crete M.D.   On: 09/02/2020 22:27   CT Head Wo Contrast  Result Date: 09/04/2020 CLINICAL DATA:  Delirium. EXAM: CT HEAD WITHOUT CONTRAST TECHNIQUE: Contiguous axial images were obtained from the base of the skull through the vertex without intravenous contrast. COMPARISON:  Head CT 04/15/2020. FINDINGS: Brain: There is no acute intracranial hemorrhage. No demarcated cortical infarct. No extra-axial fluid collection. No evidence of intracranial mass. No midline shift. Vascular: No hyperdense vessel. Skull: Normal. Negative for fracture or focal lesion. Sinuses/Orbits: Visualized orbits show no acute finding. No significant paranasal sinus disease or mastoid effusion at the imaged levels. IMPRESSION: No evidence of acute intracranial abnormality. Electronically Signed   By: Kellie Simmering DO   On: 09/04/2020 16:22   US Renal Transplant w/Doppler  Result Date: 09/08/2020 CLINICAL DATA:  65 year old female with acute renal insufficiency. Renal transplant in 2013. EXAM: ULTRASOUND OF RENAL TRANSPLANT WITH RENAL DOPPLER ULTRASOUND TECHNIQUE: Ultrasound examination of the renal transplant was performed with gray-scale, color and duplex doppler evaluation. COMPARISON:  None. FINDINGS: Transplant kidney location: Right lower quadrant Transplant Kidney: Renal measurements: 11.5 x 5.5 x 7.0 cm = volume: 242mL. Normal in size and parenchymal echogenicity. No evidence of mass or hydronephrosis. No peri-transplant fluid collection seen. Color flow in the main renal artery:  Yes Color flow in the main renal vein:  Yes Duplex Doppler Evaluation: Main Renal Artery Velocity: 162 cm/sec Main Renal Artery Resistive Index: 0.83 Venous waveform in main renal vein:  Present Intrarenal resistive index in upper pole:  0.79 (normal 0.6-0.8; equivocal  0.8-0.9; abnormal >= 0.9) Intrarenal resistive index in lower pole: 0.76 (normal 0.6-0.8; equivocal 0.8-0.9; abnormal >= 0.9) Bladder: Normal for  degree of bladder distention. Other findings:  None. IMPRESSION: 1. No hydronephrosis or nephrolithiasis of the renal transplant. No peritransplant fluid collection. 2. Renal transplant resistive indices within upper limits of normal range. Electronically Signed   By: Anner Crete M.D.   On: 09/08/2020 20:45   DG CHEST PORT 1 VIEW  Result Date: 09/05/2020 CLINICAL DATA:  Dyspnea, COPD, CHF EXAM: PORTABLE CHEST 1 VIEW COMPARISON:  Chest radiograph from one day prior. FINDINGS: Stable cardiomediastinal silhouette with mild cardiomegaly. No pneumothorax. No pleural effusion. No overt pulmonary edema. Mild bibasilar scarring versus atelectasis, similar. IMPRESSION: 1. Stable mild cardiomegaly without overt pulmonary edema. 2. Stable mild bibasilar scarring versus atelectasis. Electronically Signed   By: Ilona Sorrel M.D.   On: 09/05/2020 08:40   DG Chest Port 1 View  Result Date: 09/04/2020 CLINICAL DATA:  Questionable sepsis.  Evaluate for abnormality. EXAM: PORTABLE CHEST 1 VIEW COMPARISON:  Multiple priors, most recent 07/18/2020 FINDINGS: Cardiac silhouette is similar to priors and accentuated by low lung volumes and portable technique. Low lung volumes with streaky bibasilar opacities. No visible pleural effusions or pneumothorax. No acute osseous abnormality. IMPRESSION: Low lung volumes with streaky bibasilar opacities, favor atelectasis. Electronically Signed   By: Margaretha Sheffield MD   On: 09/04/2020 14:57       Subjective: Patient is feeling better, no further bleeding, no nausea or vomiting and tolerating po well.   Discharge Exam: Vitals:   09/27/20 0602 09/27/20 0724  BP: 132/63   Pulse: 86   Resp: 16   Temp: 97.8 F (36.6 C)   SpO2: (!) 88% 91%   Vitals:   09/26/20 2037 09/26/20 2049 09/27/20 0602 09/27/20 0724  BP: (!) 119/56  132/63   Pulse: 83  86   Resp: 16  16   Temp: 97.6 F (36.4 C)  97.8 F (36.6 C)   TempSrc: Oral  Oral   SpO2: 92% 92% (!) 88% 91%   Weight:      Height:        General: Not in pain or dyspnea.  Neurology: Awake and alert, non focal  E ENT: mild pallor, no icterus, oral mucosa moist Cardiovascular: No JVD. S1-S2 present, rhythmic, no gallops, rubs, or murmurs. ++ bilateral lower extremity edema. Pulmonary: positive breath sounds bilaterally, adequate air movement, no wheezing, rhonchi or rales. Gastrointestinal. Abdomen soft and non tender Skin. No rashes Musculoskeletal: no joint deformities   The results of significant diagnostics from this hospitalization (including imaging, microbiology, ancillary and laboratory) are listed below for reference.     Microbiology: Recent Results (from the past 240 hour(s))  Respiratory Panel by RT PCR (Flu A&B, Covid) - Nasopharyngeal Swab     Status: None   Collection Time: 09/25/20  9:25 PM   Specimen: Nasopharyngeal Swab  Result Value Ref Range Status   SARS Coronavirus 2 by RT PCR NEGATIVE NEGATIVE Final    Comment: (NOTE) SARS-CoV-2 target nucleic acids are NOT DETECTED.  The SARS-CoV-2 RNA is generally detectable in upper respiratoy specimens during the acute phase of infection. The lowest concentration of SARS-CoV-2 viral copies this assay can detect is 131 copies/mL. A negative result does not preclude SARS-Cov-2 infection and should not be used as the sole basis for treatment or other patient management decisions. A negative result may occur with  improper specimen collection/handling, submission of specimen other than nasopharyngeal swab,  presence of viral mutation(s) within the areas targeted by this assay, and inadequate number of viral copies (<131 copies/mL). A negative result must be combined with clinical observations, patient history, and epidemiological information. The expected result is Negative.  Fact Sheet for Patients:  PinkCheek.be  Fact Sheet for Healthcare Providers:   GravelBags.it  This test is no t yet approved or cleared by the [place] FDA and  has been authorized for detection and/or diagnosis of SARS-CoV-2 by FDA under an Emergency Use Authorization (EUA). This EUA will remain  in effect (meaning this test can be used) for the duration of the COVID-19 declaration under Section 564(b)(1) of the Act, 21 U.S.C. section 360bbb-3(b)(1), unless the authorization is terminated or revoked sooner.     Influenza A by PCR NEGATIVE NEGATIVE Final   Influenza B by PCR NEGATIVE NEGATIVE Final    Comment: (NOTE) The Xpert Xpress SARS-CoV-2/FLU/RSV assay is intended as an aid in  the diagnosis of influenza from Nasopharyngeal swab specimens and  should not be used as a sole basis for treatment. Nasal washings and  aspirates are unacceptable for Xpert Xpress SARS-CoV-2/FLU/RSV  testing.  Fact Sheet for Patients: PinkCheek.be  Fact Sheet for Healthcare Providers: GravelBags.it  This test is not yet approved or cleared by the [place] FDA and  has been authorized for detection and/or diagnosis of SARS-CoV-2 by  FDA under an Emergency Use Authorization (EUA). This EUA will remain  in effect (meaning this test can be used) for the duration of the  Covid-19 declaration under Section 564(b)(1) of the Act, 21  U.S.C. section 360bbb-3(b)(1), unless the authorization is  terminated or revoked. Performed at [facility], [place] [address], [place], [place] [address]      Labs: BNP (last 3 results) Recent Labs    07/01/20 2334 07/18/20 1546 09/04/20 1417  BNP 557.8* 123.4* 3,244.0*   Basic Metabolic Panel: Recent Labs  Lab 09/25/20 2053 09/26/20 0547 09/27/20 0543  NA 138 141 139  K 4.3 4.2 4.0  CL 103 107 101  CO2 26 26 28   GLUCOSE 160* 94 99  BUN 49* 44* 37*  CREATININE 1.44* 1.31* 1.51*  CALCIUM 9.5 9.3 9.2   Liver Function  Tests: Recent Labs  Lab 09/25/20 2053  AST 14*  ALT 10  ALKPHOS 73  BILITOT 0.9  PROT 8.1  ALBUMIN 3.2*   No results for input(s): LIPASE, AMYLASE in the last 168 hours. No results for input(s): AMMONIA in the last 168 hours. CBC: Recent Labs  Lab 09/25/20 2053 09/26/20 0547 09/26/20 1611 09/27/20 0543  WBC 4.7 4.1  --  4.2  NEUTROABS  --   --   --  2.6  HGB 8.5* 7.8* 7.9* 7.9*  HCT 29.2* 26.7* 26.4* 26.9*  MCV 109.4* 109.0*  --  107.6*  PLT 185 163  --  184   Cardiac Enzymes: No results for input(s): CKTOTAL, CKMB, CKMBINDEX, TROPONINI in the last 168 hours. BNP: Invalid input(s): POCBNP CBG: No results for input(s): GLUCAP in the last 168 hours. D-Dimer No results for input(s): DDIMER in the last 72 hours. Hgb A1c No results for input(s): HGBA1C in the last 72 hours. Lipid Profile No results for input(s): CHOL, HDL, LDLCALC, TRIG, CHOLHDL, LDLDIRECT in the last 72 hours. Thyroid function studies No results for input(s): TSH, T4TOTAL, T3FREE, THYROIDAB in the last 72 hours.  Invalid input(s): FREET3 Anemia work up No results for input(s): VITAMINB12, FOLATE, FERRITIN, TIBC, IRON, RETICCTPCT in the last 72  hours. Urinalysis    Component Value Date/Time   COLORURINE YELLOW 09/08/2020 1711   APPEARANCEUR CLEAR 09/08/2020 1711   LABSPEC 1.011 09/08/2020 1711   PHURINE 5.0 09/08/2020 1711   GLUCOSEU NEGATIVE 09/08/2020 1711   HGBUR SMALL (A) 09/08/2020 1711   BILIRUBINUR NEGATIVE 09/08/2020 1711   KETONESUR NEGATIVE 09/08/2020 1711   PROTEINUR NEGATIVE 09/08/2020 1711   UROBILINOGEN 0.2 05/15/2013 0733   NITRITE NEGATIVE 09/08/2020 1711   LEUKOCYTESUR NEGATIVE 09/08/2020 1711   Sepsis Labs Invalid input(s): PROCALCITONIN,  WBC,  LACTICIDVEN Microbiology Recent Results (from the past 240 hour(s))  Respiratory Panel by RT PCR (Flu A&B, Covid) - Nasopharyngeal Swab     Status: None   Collection Time: 09/25/20  9:25 PM   Specimen: Nasopharyngeal Swab   Result Value Ref Range Status   SARS Coronavirus 2 by RT PCR NEGATIVE NEGATIVE Final    Comment: (NOTE) SARS-CoV-2 target nucleic acids are NOT DETECTED.  The SARS-CoV-2 RNA is generally detectable in upper respiratoy specimens during the acute phase of infection. The lowest concentration of SARS-CoV-2 viral copies this assay can detect is 131 copies/mL. A negative result does not preclude SARS-Cov-2 infection and should not be used as the sole basis for treatment or other patient management decisions. A negative result may occur with  improper specimen collection/handling, submission of specimen other than nasopharyngeal swab, presence of viral mutation(s) within the areas targeted by this assay, and inadequate number of viral copies (<131 copies/mL). A negative result must be combined with clinical observations, patient history, and epidemiological information. The expected result is Negative.  Fact Sheet for Patients:  PinkCheek.be  Fact Sheet for Healthcare Providers:  GravelBags.it  This test is no t yet approved or cleared by the [place] FDA and  has been authorized for detection and/or diagnosis of SARS-CoV-2 by FDA under an Emergency Use Authorization (EUA). This EUA will remain  in effect (meaning this test can be used) for the duration of the COVID-19 declaration under Section 564(b)(1) of the Act, 21 U.S.C. section 360bbb-3(b)(1), unless the authorization is terminated or revoked sooner.     Influenza A by PCR NEGATIVE NEGATIVE Final   Influenza B by PCR NEGATIVE NEGATIVE Final    Comment: (NOTE) The Xpert Xpress SARS-CoV-2/FLU/RSV assay is intended as an aid in  the diagnosis of influenza from Nasopharyngeal swab specimens and  should not be used as a sole basis for treatment. Nasal washings and  aspirates are unacceptable for Xpert Xpress SARS-CoV-2/FLU/RSV  testing.  Fact Sheet for  Patients: PinkCheek.be  Fact Sheet for Healthcare Providers: GravelBags.it  This test is not yet approved or cleared by the [place] FDA and  has been authorized for detection and/or diagnosis of SARS-CoV-2 by  FDA under an Emergency Use Authorization (EUA). This EUA will remain  in effect (meaning this test can be used) for the duration of the  Covid-19 declaration under Section 564(b)(1) of the Act, 21  U.S.C. section 360bbb-3(b)(1), unless the authorization is  terminated or revoked. Performed at [facility], [place] [address] [place], [place] [address]      Time coordinating discharge: 45 minutes  SIGNED:   Tawni Millers, MD  [facility] 09/27/2020, 8:31 AM

## 2020-09-27 NOTE — TOC Transition Note (Signed)
Transition of Care [facility]) - CM/SW Discharge Note   Patient Details  Name: April [name] MRN: [id number] Date of Birth: 13-Apr-1956  Transition of Care [facility] CM/SW Contact:  Servando Snare, LCSW Phone Number: 09/27/2020, 10:14 AM   Clinical Narrative:     Patient to dc home and continue with HH. Representative with [facility] notified of patients dc.     Barriers to Discharge: No Barriers Identified   Patient Goals and CMS Choice        Discharge Placement                       Discharge Plan and Services                              Date [facility] Agency Contacted: 09/27/20 Time [place]: 1013 Representative spoke with at [place]: [place] ([place]) Interventions     Readmission Risk Interventions Readmission Risk Prevention Plan 05/15/2020 04/19/2020 02/09/2020  Transportation Screening Complete Complete Complete  PCP or Specialist Appt within 3-5 Days - - -  Not Complete comments - - -  HRI or [place] or Home Care Consult comments - - -  Social Work Consult for [place] Planning/Counseling - - -  SW consult not completed comments - - -  Palliative Care Screening - - -  Medication Review ([place]) Referral to Pharmacy Complete Referral to Pharmacy  PCP or Specialist appointment within 3-5 days of discharge Complete Complete Complete  HRI or Home Care Consult Complete Complete Complete  SW Recovery Care/Counseling Consult Complete Complete -  Palliative Care Screening Not Applicable Not Applicable Complete  Skilled Nursing Facility Complete Complete Patient Refused  Some recent data might be hidden

## 2020-10-04 IMAGING — CT CT RENAL STONE PROTOCOL
2 of 4 series · 17 of 46 positions shown, 19 images · non-contrast
Comparison: None.

CLINICAL DATA: Nausea and flank pain

EXAM:
CT ABDOMEN AND PELVIS WITHOUT CONTRAST
TECHNIQUE: Multidetector CT imaging of the abdomen and pelvis was performed
following the standard protocol without IV contrast.

[Series 2: axial st · axial · 0.96mm/px · z∈[-725,-300]mm · 14 of 95 slices shown, 16 images]
[im 5/95  soft-tissue]
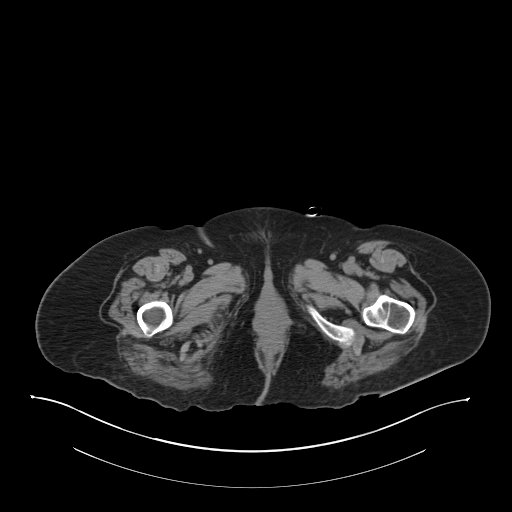
[im 5/95  bone]
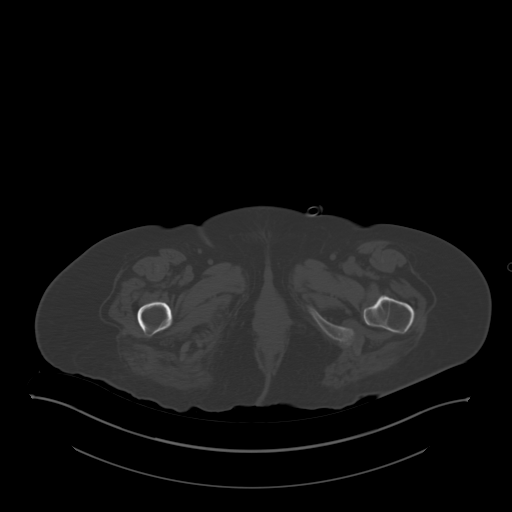
[im 10/95  soft-tissue]
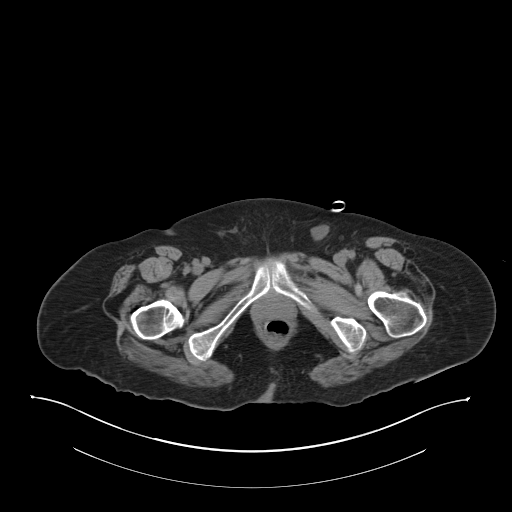
[im 20/95  soft-tissue]
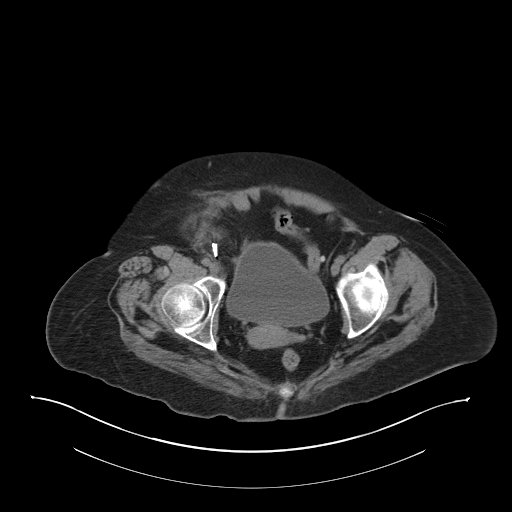
[im 25/95  soft-tissue]
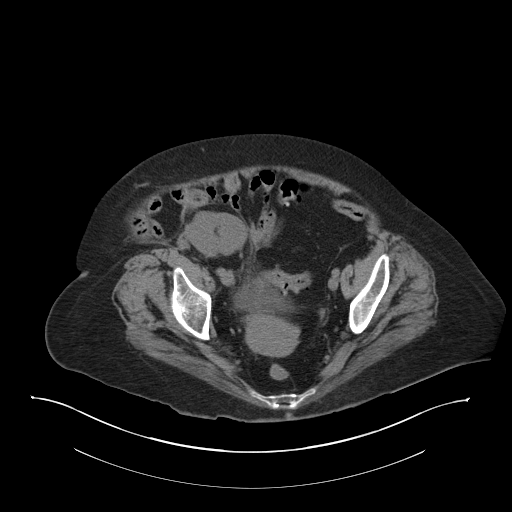
[im 30/95  soft-tissue]
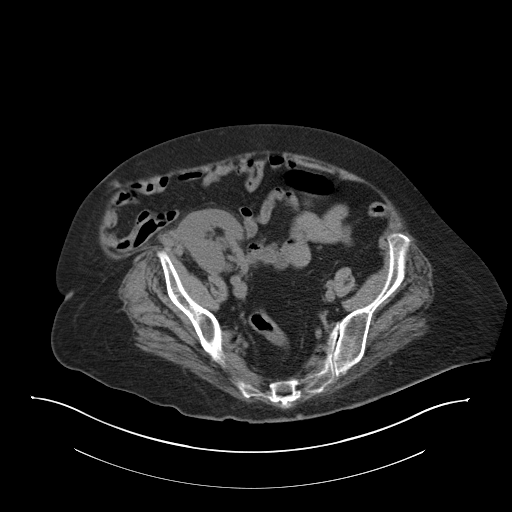
[im 40/95  soft-tissue]
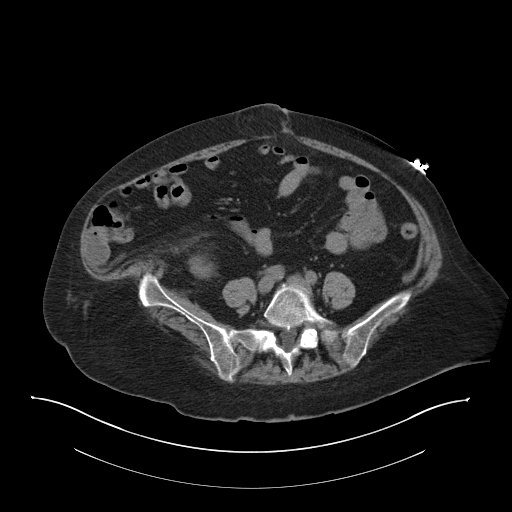
[im 45/95  soft-tissue]
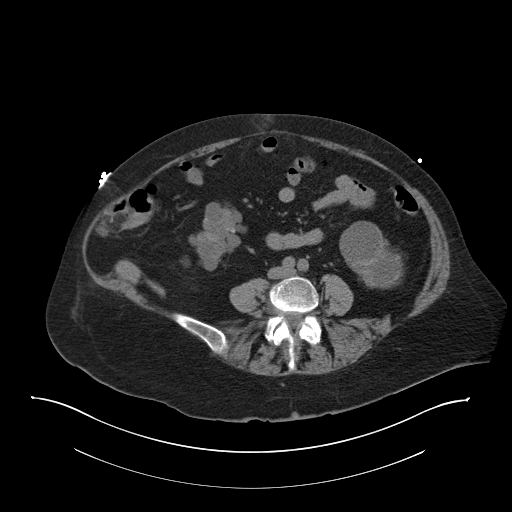
[im 50/95  soft-tissue]
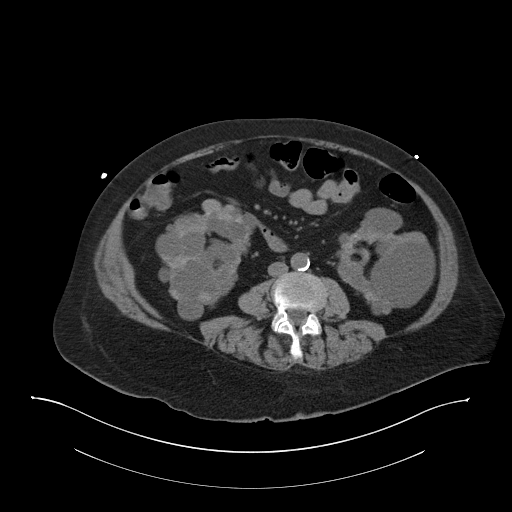
[im 55/95  soft-tissue]
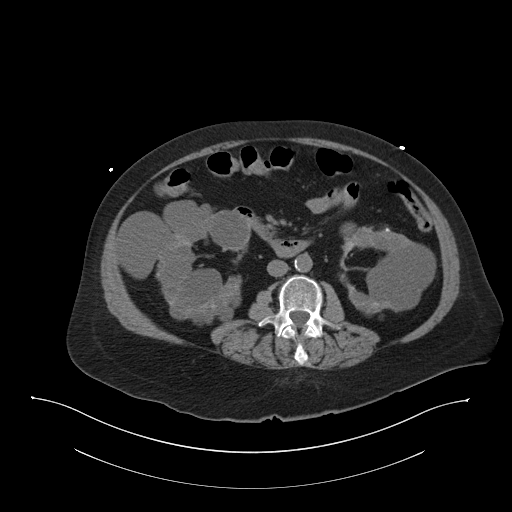
[im 55/95  bone]
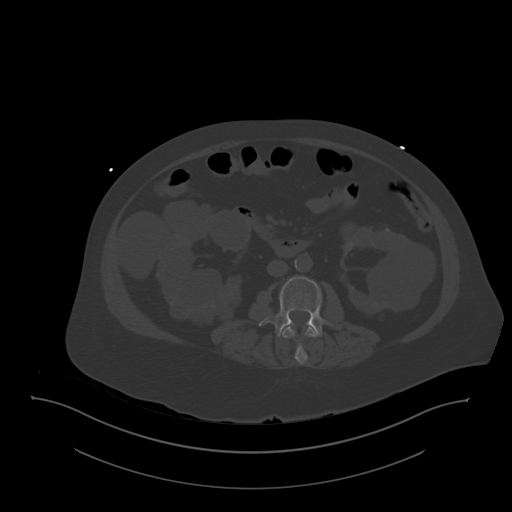
[im 65/95  soft-tissue]
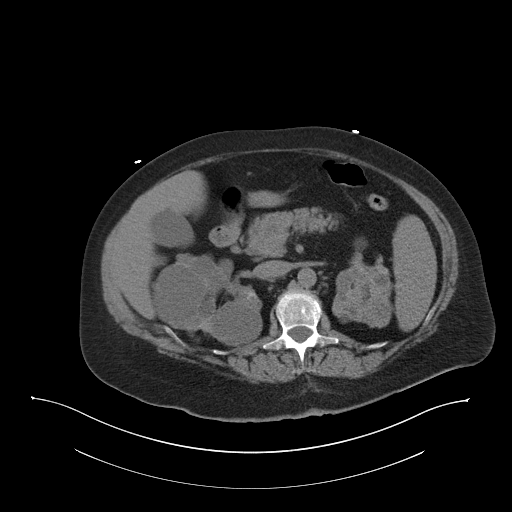
[im 70/95  soft-tissue]
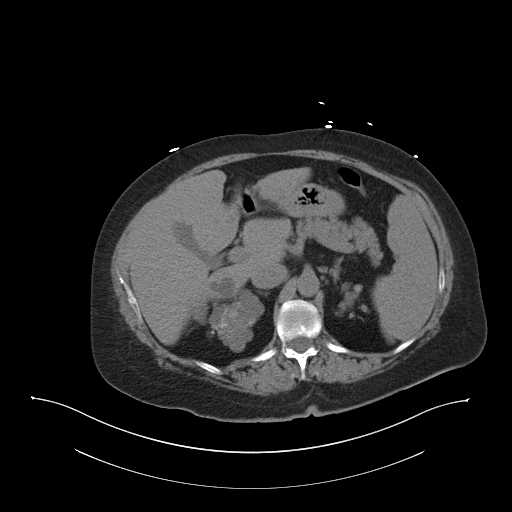
[im 75/95  soft-tissue]
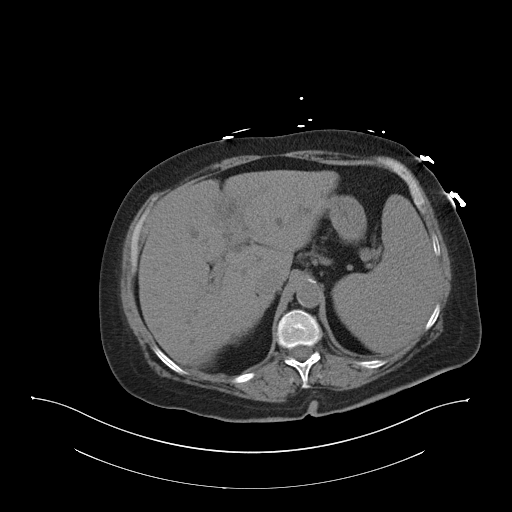
[im 85/95  soft-tissue]
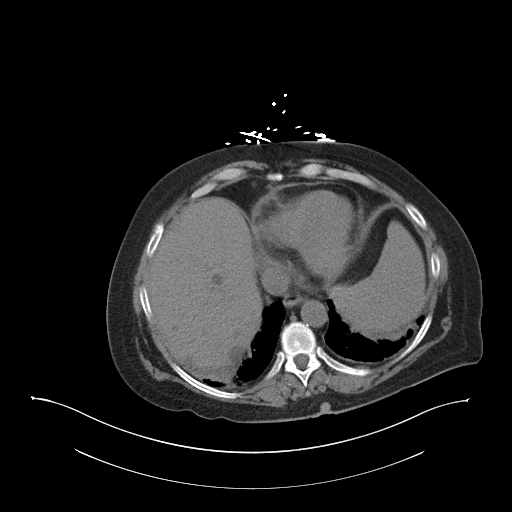
[im 90/95  soft-tissue]
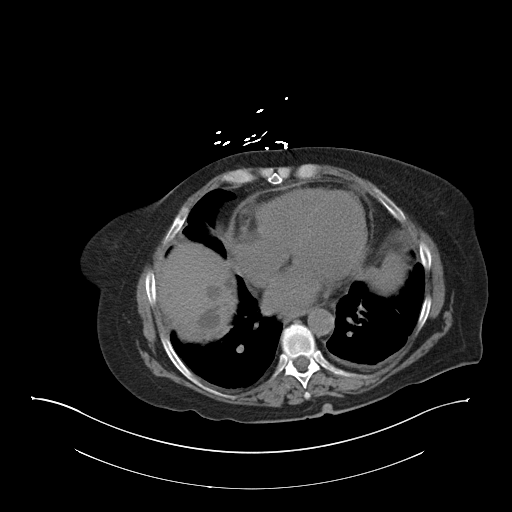

[Series 4: coronal · coronal · 0.92mm/px · 3 of 151 slices shown]
[im 51/151  soft-tissue]
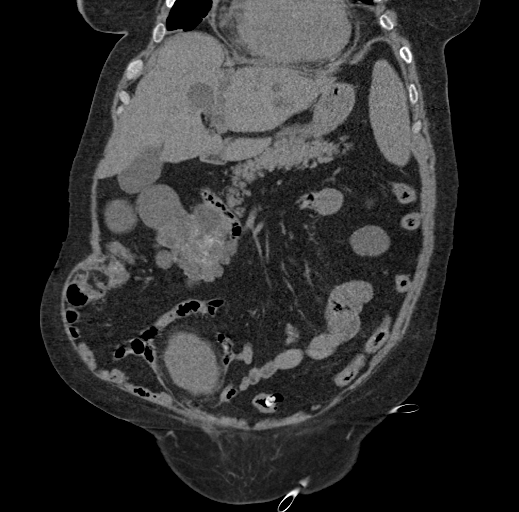
[im 67/151  soft-tissue]
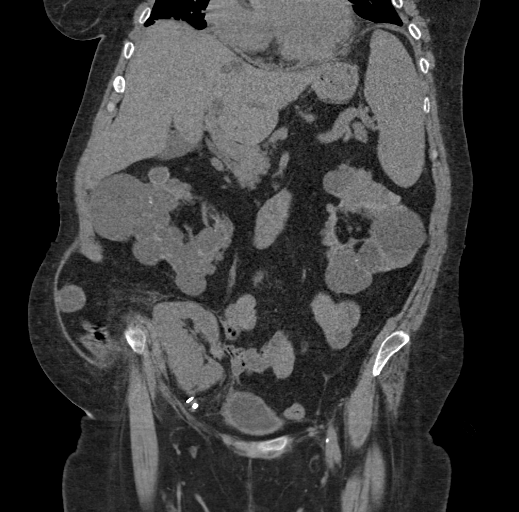
[im 84/151  soft-tissue]
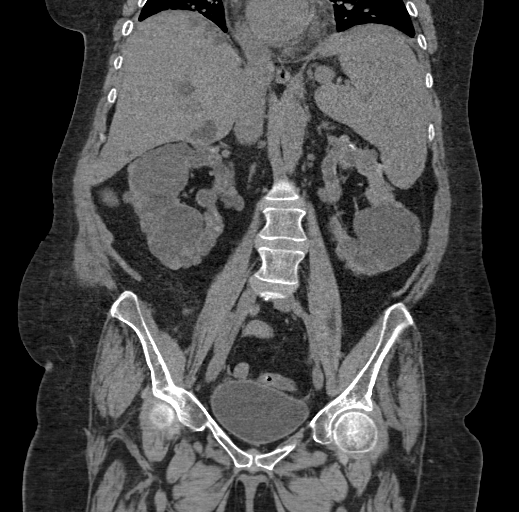

[17 of 46 positions shown; findings below may reference images not displayed]

FINDINGS: Lower chest: Mild atelectatic changes are noted in the bases
bilaterally.

Hepatobiliary: Scattered cysts are noted throughout the liver. The
gallbladder is within normal limits.

Pancreas: Pancreas is within normal limits.

Spleen: Spleen is unremarkable.

Adrenals/Urinary Tract: Adrenal glands are within normal limits. The
native kidneys demonstrate multiple cysts consistent with polycystic
kidney disease. No obstructive changes are seen. No obstructive
changes are noted in the native kidneys. Bladder is well distended.
A transplant kidney is noted in the right lower quadrant without
obstructive change or renal calculi. No inflammatory changes are
seen.

Stomach/Bowel: Scattered diverticular changes noted throughout the
colon. No obstructive or inflammatory changes are seen. The appendix
is within normal limits. Small bowel and stomach are unremarkable.

Vascular/Lymphatic: Aortic atherosclerosis. No enlarged abdominal or
pelvic lymph nodes.

Reproductive: Uterus and bilateral adnexa are unremarkable.

Other: Small periumbilical fat containing hernia is noted.

Musculoskeletal: Mild degenerative changes of lumbar spine are
noted.
IMPRESSION: Renal transplant kidney in the right lower quadrant without
obstructive change.

Changes consistent with polycystic renal disease and multiple
hepatic cysts. No acute obstructive changes are seen.

Diverticulosis without diverticulitis.

Normal-appearing appendix.

Periumbilical fat containing hernia. No bowel loops are noted
within.

## 2020-10-06 ENCOUNTER — Other Ambulatory Visit: Payer: Self-pay

## 2020-10-06 ENCOUNTER — Emergency Department (HOSPITAL_COMMUNITY): Payer: Medicare Other

## 2020-10-06 ENCOUNTER — Emergency Department (HOSPITAL_COMMUNITY)
Admission: EM | Admit: 2020-10-06 | Discharge: 2020-10-06 | Disposition: A | Discharge status: Home / Self Care | Payer: Medicare Other | Attending: Emergency Medicine | Admitting: Emergency Medicine

## 2020-10-06 DIAGNOSIS — N186 End stage renal disease: Secondary | ICD-10-CM | POA: Insufficient documentation

## 2020-10-06 DIAGNOSIS — Z87891 Personal history of nicotine dependence: Secondary | ICD-10-CM | POA: Insufficient documentation

## 2020-10-06 DIAGNOSIS — Z96642 Presence of left artificial hip joint: Secondary | ICD-10-CM | POA: Diagnosis not present

## 2020-10-06 DIAGNOSIS — I132 Hypertensive heart and chronic kidney disease with heart failure and with stage 5 chronic kidney disease, or end stage renal disease: Secondary | ICD-10-CM | POA: Insufficient documentation

## 2020-10-06 DIAGNOSIS — Z20822 Contact with and (suspected) exposure to covid-19: Secondary | ICD-10-CM | POA: Insufficient documentation

## 2020-10-06 DIAGNOSIS — R0602 Shortness of breath: Secondary | ICD-10-CM | POA: Diagnosis present

## 2020-10-06 DIAGNOSIS — J441 Chronic obstructive pulmonary disease with (acute) exacerbation: Secondary | ICD-10-CM

## 2020-10-06 DIAGNOSIS — I5033 Acute on chronic diastolic (congestive) heart failure: Secondary | ICD-10-CM | POA: Insufficient documentation

## 2020-10-06 DIAGNOSIS — Z79899 Other long term (current) drug therapy: Secondary | ICD-10-CM | POA: Diagnosis not present

## 2020-10-06 DIAGNOSIS — Z7951 Long term (current) use of inhaled steroids: Secondary | ICD-10-CM | POA: Diagnosis not present

## 2020-10-06 DIAGNOSIS — J449 Chronic obstructive pulmonary disease, unspecified: Secondary | ICD-10-CM | POA: Diagnosis not present

## 2020-10-06 DIAGNOSIS — I5030 Unspecified diastolic (congestive) heart failure: Secondary | ICD-10-CM

## 2020-10-06 LAB — BLOOD GAS, VENOUS
Acid-Base Excess: 0.7 mmol/L (ref 0.0–2.0)
Bicarbonate: 26.1 mmol/L (ref 20.0–28.0)
FIO2: 21
O2 Saturation: 65.5 %
Patient temperature: 98.6
pCO2, Ven: 48.6 mmHg (ref 44.0–60.0)
pH, Ven: 7.35 (ref 7.250–7.430)
pO2, Ven: 36.1 mmHg (ref 32.0–45.0)

## 2020-10-06 LAB — COMPREHENSIVE METABOLIC PANEL
ALT: 12 U/L (ref 0–44)
AST: 20 U/L (ref 15–41)
Albumin: 2.8 g/dL — ABNORMAL LOW (ref 3.5–5.0)
Alkaline Phosphatase: 73 U/L (ref 38–126)
Anion gap: 13 (ref 5–15)
BUN: 27 mg/dL — ABNORMAL HIGH (ref 8–23)
CO2: 24 mmol/L (ref 22–32)
Calcium: 9.3 mg/dL (ref 8.9–10.3)
Chloride: 97 mmol/L — ABNORMAL LOW (ref 98–111)
Creatinine, Ser: 1.17 mg/dL — ABNORMAL HIGH (ref 0.44–1.00)
GFR, Estimated: 52 mL/min — ABNORMAL LOW (ref >60)
Glucose, Bld: 64 mg/dL — ABNORMAL LOW (ref 70–99)
Potassium: 4.4 mmol/L (ref 3.5–5.1)
Sodium: 134 mmol/L — ABNORMAL LOW (ref 135–145)
Total Bilirubin: 1.9 mg/dL — ABNORMAL HIGH (ref 0.3–1.2)
Total Protein: 7.6 g/dL (ref 6.5–8.1)

## 2020-10-06 LAB — RESPIRATORY PANEL BY RT PCR (FLU A&B, COVID)
Influenza A by PCR: NEGATIVE
Influenza B by PCR: NEGATIVE
SARS Coronavirus 2 by RT PCR: NEGATIVE

## 2020-10-06 LAB — CBC WITH DIFFERENTIAL/PLATELET
Abs Immature Granulocytes: 0.03 10*3/uL (ref 0.00–0.07)
Basophils Absolute: 0 10*3/uL (ref 0.0–0.1)
Basophils Relative: 0 %
Eosinophils Absolute: 0.1 10*3/uL (ref 0.0–0.5)
Eosinophils Relative: 1 %
HCT: 31.1 % — ABNORMAL LOW (ref 36.0–46.0)
Hemoglobin: 9.3 g/dL — ABNORMAL LOW (ref 12.0–15.0)
Immature Granulocytes: 0 %
Lymphocytes Relative: 8 %
Lymphs Abs: 0.8 10*3/uL (ref 0.7–4.0)
MCH: 31.4 pg (ref 26.0–34.0)
MCHC: 29.9 g/dL — ABNORMAL LOW (ref 30.0–36.0)
MCV: 105.1 fL — ABNORMAL HIGH (ref 80.0–100.0)
Monocytes Absolute: 0.6 10*3/uL (ref 0.1–1.0)
Monocytes Relative: 6 %
Neutro Abs: 8.4 10*3/uL — ABNORMAL HIGH (ref 1.7–7.7)
Neutrophils Relative %: 85 %
Platelets: 186 10*3/uL (ref 150–400)
RBC: 2.96 MIL/uL — ABNORMAL LOW (ref 3.87–5.11)
RDW: 19.5 % — ABNORMAL HIGH (ref 11.5–15.5)
WBC: 9.9 10*3/uL (ref 4.0–10.5)
nRBC: 0 % (ref 0.0–0.2)

## 2020-10-06 LAB — I-STAT CHEM 8, ED
BUN: 27 mg/dL — ABNORMAL HIGH (ref 8–23)
Calcium, Ion: 1.16 mmol/L (ref 1.15–1.40)
Chloride: 100 mmol/L (ref 98–111)
Creatinine, Ser: 0.9 mg/dL (ref 0.44–1.00)
Glucose, Bld: 63 mg/dL — ABNORMAL LOW (ref 70–99)
HCT: 27 % — ABNORMAL LOW (ref 36.0–46.0)
Hemoglobin: 9.2 g/dL — ABNORMAL LOW (ref 12.0–15.0)
Potassium: 4 mmol/L (ref 3.5–5.1)
Sodium: 131 mmol/L — ABNORMAL LOW (ref 135–145)
TCO2: 24 mmol/L (ref 22–32)

## 2020-10-06 LAB — TROPONIN I (HIGH SENSITIVITY)
Troponin I (High Sensitivity): 9 ng/L (ref <18)
Troponin I (High Sensitivity): 7 ng/L (ref <18)

## 2020-10-06 LAB — BRAIN NATRIURETIC PEPTIDE: B Natriuretic Peptide: 348.1 pg/mL — ABNORMAL HIGH (ref 0.0–100.0)

## 2020-10-06 LAB — CBG MONITORING, ED
Glucose-Capillary: 63 mg/dL — ABNORMAL LOW (ref 70–99)
Glucose-Capillary: 81 mg/dL (ref 70–99)

## 2020-10-06 MED ORDER — METHYLPREDNISOLONE SODIUM SUCC 125 MG IJ SOLR
125.0000 mg | Freq: Once | INTRAMUSCULAR | Status: AC
  Administered 2020-10-06: 125 mg via INTRAVENOUS
  Filled 2020-10-06: qty 2

## 2020-10-06 MED ORDER — MAGNESIUM SULFATE 2 GM/50ML IV SOLN
2.0000 g | Freq: Once | INTRAVENOUS | Status: AC
  Administered 2020-10-06: 2 g via INTRAVENOUS
  Filled 2020-10-06: qty 50

## 2020-10-06 MED ORDER — PREDNISONE 20 MG PO TABS: ORAL_TABLET | ORAL | 0 refills | Status: DC

## 2020-10-06 MED ORDER — FUROSEMIDE 10 MG/ML IJ SOLN
40.0000 mg | Freq: Once | INTRAMUSCULAR | Status: AC
  Administered 2020-10-06: 40 mg via INTRAVENOUS
  Filled 2020-10-06: qty 4

## 2020-10-06 MED ORDER — ALBUTEROL SULFATE HFA 108 (90 BASE) MCG/ACT IN AERS
2.0000 | INHALATION_SPRAY | Freq: Once | RESPIRATORY_TRACT | Status: AC
  Administered 2020-10-06: 2 via RESPIRATORY_TRACT
  Filled 2020-10-06: qty 6.7

## 2020-10-06 NOTE — ED Notes (Signed)
CBG reading was 63. RN notified.

## 2020-10-06 NOTE — ED Provider Notes (Signed)
[facility] DEPT Provider Note   CSN: [id number] Arrival date & time: 10/06/20  1819     History Chief Complaint  Patient presents with  . Shortness of Breath    April [name] is a 64 y.o. female history of COPD and CHF, hypertension, hyperlipidemia, polycystic kidney disease status post kidney transplant here presenting with shortness of breath.  Patient states that she has been short of breath for the last several days.  Patient states that she had some leg swelling and was told to take Lasix 80 mg and has been doing so for the last 2 days.  She states that her leg swelling improved.  However her shortness of breath got worse.  She states that she has oxygen at home as needed.  However for the last 2 days she has been wearing constantly.  Denies any fever or chills.  The history is provided by the patient.       Past Medical History:  Diagnosis Date  . Arthritis   . Asthma   . CHF (congestive heart failure) ([place])   . COPD (chronic obstructive pulmonary disease) ([place])   . Depression   . Essential hypertension 02/08/2020  . GERD (gastroesophageal reflux disease)   . Hyperlipidemia   . Hyperparathyroidism   . Polycystic kidney   . PONV (postoperative nausea and vomiting)     Patient Active Problem List   Diagnosis Date Noted  . Lower GI bleeding 09/25/2020  . Diarrhea 09/10/2020  . Acute renal failure superimposed on stage 3b chronic kidney disease ([place]) 09/08/2020  . Diverticulitis 09/04/2020  . SIRS (systemic inflammatory response syndrome) ([place]) 07/02/2020  . Chronic kidney disease, stage 3a ([place])   . 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 History of immunosuppressive therapy 05/27/2020  . FUO (fever of unknown origin) 05/15/2020  . Immunocompromised state ([place])   . Decubitus ulcer of coccyx, unstageable ([place]) 05/01/2020  . Pericardial effusion 04/16/2020  . Chronic respiratory failure with hypoxia ([place]) 04/16/2020  .  Proteinuria 04/16/2020  . Pressure injury of skin 04/08/2020  . COPD (chronic obstructive pulmonary disease) ([place]) 04/07/2020  . SVT (supraventricular tachycardia) ([place])   . Anemia of chronic disease   . Palliative care encounter   . Goals of care, counseling/discussion   . Essential hypertension 02/08/2020  . Glaucoma 02/08/2020  . Sepsis with encephalopathy without septic shock ([place]) 01/31/2020  . Fracture of left superior pubic ramus ([facility])   . Pain   . Anxiety state   . Left displaced femoral neck fracture ([place]) 12/15/2019  . Status post total hip replacement, left   . Post-op pain   . Polycystic kidney   . Closed left hip fracture ([place]) 12/07/2019  . Left wrist fracture 12/07/2019  . Medication management 09/22/2019  . Physical deconditioning 09/22/2019  . Acute respiratory failure with hypoxia ([place]) 04/19/2019  . Acute on chronic diastolic CHF (congestive heart failure) ([place]) 04/19/2019  . Bilateral closed proximal tibial fracture 12/21/2018  . Asthma, chronic, unspecified asthma severity, with acute exacerbation 10/03/2018  . Chronic diastolic CHF (congestive heart failure) ([place]) 10/01/2017  . Asthma, mild intermittent 07/15/2016  . GERD without esophagitis 07/15/2016  . Renal transplant recipient 07/15/2016  . Hyperlipidemia 07/15/2016  . Depression 07/15/2016  . Bronchitis, mucopurulent recurrent ([place]) 08/08/2014  . Chronic cough 07/24/2014  . End stage renal disease ([place]) 03/23/2012  . 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, implant, and graft 03/23/2012    Past Surgical History:  Procedure Laterality  Date  . 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 BILATERAL OOPHORECTOMY    . BIOPSY  05/19/2020   Procedure: BIOPSY;  Surgeon: Carol Ada, MD;  Location: [facility] ENDOSCOPY;  Service: Endoscopy;;  . CARPAL TUNNEL RELEASE  2000  . CESAREAN SECTION    . COLONOSCOPY WITH PROPOFOL N/A 05/19/2020   Procedure: COLONOSCOPY WITH PROPOFOL;   Surgeon: Carol Ada, MD;  Location: [facility];  Service: Endoscopy;  Laterality: N/A;  . ENTEROSCOPY N/A 05/19/2020   Procedure: ENTEROSCOPY;  Surgeon: Carol Ada, MD;  Location: [facility];  Service: Endoscopy;  Laterality: N/A;  . INSERTION OF DIALYSIS CATHETER  03/31/2012   Procedure: INSERTION OF DIALYSIS CATHETER;  Surgeon: Angelia Mould, MD;  Location: [facility];  Service: Vascular;  Laterality: N/A;  insertion of dialysis catheter right internal jugular vein  . 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 ORIF TIBIA FRACTURE Left 12/23/2018   Procedure: OPEN REDUCTION INTERNAL FIXATION (ORIF) TIBIA FRACTURE;  Surgeon: Shona Needles, MD;  Location: [facility];  Service: Orthopedics;  Laterality: Left;  . ORIF TIBIA PLATEAU Right 12/23/2018   Procedure: OPEN REDUCTION INTERNAL FIXATION (ORIF) TIBIAL PLATEAU;  Surgeon: Shona Needles, MD;  Location: [facility];  Service: Orthopedics;  Laterality: Right;  . ORIF WRIST FRACTURE Left 12/08/2019   Procedure: OPEN REDUCTION INTERNAL FIXATION (ORIF) WRIST FRACTURE, REPAIR LACERATION LEFT FOREARM;  Surgeon: Dorna Leitz, MD;  Location: [facility];  Service: Orthopedics;  Laterality: Left;  . TONSILLECTOMY  1967  . TOTAL HIP ARTHROPLASTY Left 12/08/2019   Procedure: TOTAL HIP ARTHROPLASTY ANTERIOR APPROACH;  Surgeon: Dorna Leitz, MD;  Location: [facility];  Service: Orthopedics;  Laterality: Left;  . TUBAL LIGATION  2010     OB History   No obstetric history on file.     Family History  Problem Relation Age of Onset  . Hypertension Mother   . Heart disease Father        CABG history  . Polycystic kidney disease Father   . Polycystic kidney disease Brother     Social History   Tobacco Use  . Smoking status: Former Smoker    Packs/day: 0.25    Years: 15.00    Pack years: 3.75    Types: Cigarettes    Quit date: 03/22/1998    Years since quitting: 22.5  . Smokeless tobacco: Never Used  Vaping Use  . Vaping Use: Never used  Substance Use Topics   . Alcohol use: No  . Drug use: No    Home Medications Prior to Admission medications   Medication Sig Start Date End Date Taking? Authorizing Provider  acetaminophen (TYLENOL) 325 MG tablet Take 2 tablets (650 mg total) by mouth every 6 (six) hours as needed for mild pain (or Fever >/= 101). 05/22/20   Elgergawy, Silver Huguenin, MD  albuterol (VENTOLIN HFA) 108 (90 Base) MCG/ACT inhaler Inhale 2 puffs into the lungs every 4 (four) hours as needed for wheezing or shortness of breath. 01/04/20   Angiulli, Lavon Paganini, PA-C  allopurinol (ZYLOPRIM) 100 MG tablet Take 1 tablet (100 mg total) by mouth daily. 09/12/20 10/12/20  Elodia Florence., MD  ascorbic acid (VITAMIN C) 500 MG tablet Take 500 mg by mouth daily.    [provider]  brimonidine (ALPHAGAN P) 0.1 % SOLN Place 1 drop into both eyes 2 (two) times daily.     [provider]  Budeson-Glycopyrrol-Formoterol (BREZTRI AEROSPHERE) 160-9-4.8 MCG/ACT AERO Inhale 2 puffs  into the lungs 2 (two) times daily. 08/20/20   Martyn Ehrich, NP  cholecalciferol (VITAMIN D3) 25 MCG (1000 UNIT) tablet Take 1 tablet (1,000 Units total) by mouth daily. 01/04/20   Angiulli, Lavon Paganini, PA-C  cyanocobalamin 1000 MCG tablet Take 1,000 mcg by mouth daily.    [provider]  cycloSPORINE (RESTASIS) 0.05 % ophthalmic emulsion Place 1 drop into both eyes 2 (two) times daily. 01/04/20   Angiulli, Lavon Paganini, PA-C  dorzolamide (TRUSOPT) 2 % ophthalmic solution Place 1 drop into both eyes daily. 06/25/19   [provider]  ferrous sulfate 325 (65 FE) MG tablet Take 325 mg by mouth daily with breakfast.    [provider]  FLUoxetine (PROZAC) 40 MG capsule Take 1 capsule (40 mg total) by mouth daily. 01/04/20   Angiulli, Lavon Paganini, PA-C  furosemide (LASIX) 80 MG tablet Take 80 mg by mouth 2 (two) times daily.  09/17/20   [provider]  gabapentin (NEURONTIN) 100 MG capsule Take 200 mg by mouth 3 (three) times daily.     [provider]  ipratropium-albuterol (DUONEB) 0.5-2.5 (3) MG/3ML SOLN Take 3 mLs by nebulization every 6 (six) hours as needed (sob/wheezing).  08/24/20   [provider]  isosorbide mononitrate (IMDUR) 60 MG 24 hr tablet Take 1 tablet (60 mg total) by mouth daily. 05/23/20   Elgergawy, Silver Huguenin, MD  levalbuterol Penne Lash) 0.63 MG/3ML nebulizer solution Take 0.63 mg by nebulization every 6 (six) hours as needed for wheezing or shortness of breath. Inhale 0.63mg  once daily.  May take 0.63mg  every six hours as needed COPD.    [provider]  magnesium oxide (MAG-OX) 400 MG tablet Take 1 tablet (400 mg total) by mouth 2 (two) times daily. 01/04/20   Angiulli, Lavon Paganini, PA-C  mycophenolate (MYFORTIC) 180 MG EC tablet Take 180 mg by mouth 2 (two) times daily.    [provider]  oxyCODONE (ROXICODONE) 5 MG immediate release tablet Take 1 tablet (5 mg total) by mouth every 6 (six) hours as needed for up to 15 doses for breakthrough pain. 09/02/20   Curatolo, Adam, DO  pantoprazole (PROTONIX) 40 MG tablet Take 1 tablet (40 mg total) by mouth 2 (two) times daily. 01/04/20   Angiulli, Lavon Paganini, PA-C  predniSONE (DELTASONE) 5 MG tablet Take 7.5 mg by mouth daily with breakfast.    [provider]  PROGRAF 1 MG capsule Take 1-2 mg by mouth daily. 2mg  in the morning 1mg  in the afternoon 03/22/19   [provider]  promethazine (PHENERGAN) 25 MG tablet Take 1 tablet (25 mg total) by mouth every 8 (eight) hours as needed for up to 15 doses for nausea or vomiting. 09/02/20   Curatolo, Adam, DO  TRAVATAN Z 0.004 % SOLN ophthalmic solution Place 1 drop into both eyes at bedtime. 07/03/16   [provider]  valACYclovir (VALTREX) 500 MG tablet Take 500 mg by mouth 2 (two) times daily.    [provider]  verapamil (CALAN-SR) 240 MG CR tablet Take 240 mg by mouth daily.    [provider]    Allergies    Ardyth Harps [iron dextran], Pentamidine,  Erythromycin [erythromycin], Iohexol, Oxycodone, Erythromycin, and Ultram [tramadol hcl]  Review of Systems   Review of Systems  Respiratory: Positive for shortness of breath.   All other systems reviewed and are negative.   Physical Exam Updated Vital Signs BP 132/61 (BP Location: Right Arm)   Pulse 99   Temp  98.7 F (37.1 C) (Oral)   Resp 19   Ht 5\' 1"  (1.549 m)   Wt 72.6 kg   SpO2 100%   BMI 30.23 kg/m   Physical Exam Vitals and nursing note reviewed.  HENT:     Head: Normocephalic.  Eyes:     Extraocular Movements: Extraocular movements intact.     Pupils: Pupils are equal, round, and reactive to light.  Cardiovascular:     Rate and Rhythm: Normal rate and regular rhythm.  Pulmonary:     Comments: + Crackles bilateral bases Abdominal:     General: Bowel sounds are normal.     Palpations: Abdomen is soft.  Musculoskeletal:     Cervical back: Normal range of motion and neck supple.     Comments: 1+ edema bilaterally   Skin:    General: Skin is warm.     Capillary Refill: Capillary refill takes less than 2 seconds.  Neurological:     General: No focal deficit present.     Mental Status: She is alert and oriented to person, place, and time.  Psychiatric:        Mood and Affect: Mood normal.        Behavior: Behavior normal.     ED Results / Procedures / Treatments   Labs (all labs ordered are listed, but only abnormal results are displayed) Labs Reviewed  RESPIRATORY PANEL BY RT PCR (FLU A&B, COVID)  CBC WITH DIFFERENTIAL/PLATELET  COMPREHENSIVE METABOLIC PANEL  BLOOD GAS, VENOUS  BRAIN NATRIURETIC PEPTIDE  I-STAT CHEM 8, ED  TROPONIN I (HIGH SENSITIVITY)    EKG None  Radiology No results found.  Procedures Procedures (including critical care time)  Medications Ordered in ED Medications - No data to display  ED Course  I have reviewed the triage vital signs and the nursing notes.  Pertinent labs & imaging results that were available during  my care of the patient were reviewed by me and considered in my medical decision making (see chart for details).    MDM Rules/Calculators/A&P                         RAINN ZUPKO is a 64 y.o. female here presenting with shortness of breath.  She has a history of CHF and COPD.  She is already taking Lasix at home.  Consider CHF and COPD exacerbation.  Patient has oxygen at home.  Plan to get ABG and CBC and CMP and BNP and chest x-ray.  Will give Lasix and albuterol and Solu-Medrol and reassess  9:45 PM pH is 7.35 and CO2 is forty-eight.  Her Covid is negative.  Her BNP is down to 300.  Her chest x-ray showed atelectasis and no obvious pulmonary edema. Patient is stable on her baseline 2 L.  She was also given Lasix.  At this point, I think patient can go home.  She can be discharged with a course of steroids.  Her glucose was briefly low but she ate some food and went up to 81.    Final Clinical Impression(s) / ED Diagnoses Final diagnoses:  None    Rx / DC Orders ED Discharge Orders    None       Drenda Freeze, MD 10/06/20 2146

## 2020-10-06 NOTE — Discharge Instructions (Signed)
Please increase your prednisone as prescribed.  After the burst steroids is done you need to go back on to your home dose of prednisone.  See your doctor in a week.  Use albuterol as needed for cough and wheezing  Return to ER if you have worse shortness of breath, trouble breathing, fever.

## 2020-10-06 NOTE — ED Notes (Signed)
Blood glucose 63 given 8 oz of apple juice and peanut butter will recheck

## 2020-10-06 NOTE — ED Triage Notes (Signed)
Per [facility] pt is from home. Stated shob, using nebs with no improvement. Has not taken lasix in two days. 2L of O2 continuously at home. Vitals 108/70 H 102 T 98.7 O2 98% on 2 L  R 20 18 g RAC

## 2020-10-06 NOTE — ED Notes (Signed)
Blood glucose 81 after interventions

## 2020-10-06 NOTE — ED Notes (Signed)
Pt. I-stat Chem 8 results sodium 131. EDP, Yao,MD. Made aware.

## 2020-10-15 ENCOUNTER — Other Ambulatory Visit: Payer: Self-pay

## 2020-10-15 ENCOUNTER — Encounter: Payer: Self-pay | Admitting: Pulmonary Disease

## 2020-10-15 ENCOUNTER — Telehealth: Payer: Self-pay | Admitting: Pharmacy Technician

## 2020-10-15 ENCOUNTER — Ambulatory Visit (INDEPENDENT_AMBULATORY_CARE_PROVIDER_SITE_OTHER): Payer: Medicare Other | Admitting: Pulmonary Disease

## 2020-10-15 VITALS — BP 118/58 | HR 89 | Temp 97.2°F | Ht 61.0 in | Wt 152.0 lb

## 2020-10-15 DIAGNOSIS — J45901 Unspecified asthma with (acute) exacerbation: Secondary | ICD-10-CM

## 2020-10-15 NOTE — Telephone Encounter (Signed)
Received New start paperwork for DUPIXENT. Will update as we work through the benefits process. 

## 2020-10-15 NOTE — Telephone Encounter (Signed)
We can try either fasenra and nucala if the insurance would approve. The issue is her peripheral eosinophils are low because she is on chronic steroids

## 2020-10-15 NOTE — Patient Instructions (Signed)
Continue the breztri inhaler We will start you on an injection called Dupixent for asthma.  Follow-up in 3 months.

## 2020-10-15 NOTE — Telephone Encounter (Signed)
Submitted a Prior Authorization request to [facility] for [facility] via Cover My Meds. Will update once we receive a response.   [employer] - PA Case ID: [id number]

## 2020-10-15 NOTE — Progress Notes (Signed)
April [name]    [id number]    [birth year]  Primary Care Physician:McNeill, Abigail Butts, MD  Referring Physician: [name], [address] [place],  [place] [address]  Chief complaint: Follow-up for asthma  HPI: April [name] has history of asthma, GERD, kidney failure status post transplant in 2013 secondary to polycystic kidney disease, hyperlipidemia, smoldering multiple myeloma.  Previously maintained on Symbicort inhaler  Previously seen in pulmonary clinic in 2017 and lost to follow-up.  At that time she had mild symptoms and was prescribed Symbicort.  She had stopped taking Symbicort since her dyspnea was under good control.  Over the past 1 to 2 years she reports increasing frequency of exacerbation requiring multiple hospitalizations and prednisone taper.  Asthma control worsened after she had tibial fracture and was sent to rehab in February 2020.  Hospitalized at [facility] in August 2020 for asthma exacerbation treated with prednisone taper.  Reports mold exposure from leaky roof.  She also has history of GERD and symptoms are controlled on Protonix once daily.  Pets: No pets Occupation: Retired Education officer, museum Exposures: Reports roof leak and discovery of mold in the home in August 2020.  They are in the process of getting this fixed. Smoking history: 7-pack-year smoker.  Quit in 1989 Travel history: No significant travel history Relevant family history: No significant family history of lung disease.  Interim history: She has had multiple hospitalizations over the year for various issues including hip fracture, blood loss anemia heart failure, sepsis secondary to UTI, diverticulitis, asthma exacerbation  She continues to feel poorly with dyspnea, cough.  Denies any fevers, chills, sputum production  Outpatient Encounter Medications as of 10/15/2020  Medication Sig  . acetaminophen (TYLENOL) 325 MG tablet Take 2 tablets (650 mg total) by  mouth every 6 (six) hours as needed for mild pain (or Fever >/= 101).  [name] albuterol (VENTOLIN HFA) 108 (90 Base) MCG/ACT inhaler Inhale 2 puffs into the lungs every 4 (four) hours as needed for wheezing or shortness of breath.  [name] ascorbic acid (VITAMIN C) 500 MG tablet Take 500 mg by mouth daily.  . brimonidine (ALPHAGAN P) 0.1 % SOLN Place 1 drop into both eyes 2 (two) times daily.   . Budeson-Glycopyrrol-Formoterol (BREZTRI AEROSPHERE) 160-9-4.8 MCG/ACT AERO Inhale 2 puffs into the lungs 2 (two) times daily.  . cholecalciferol (VITAMIN D3) 25 MCG (1000 UNIT) tablet Take 1 tablet (1,000 Units total) by mouth daily.  . cyanocobalamin 1000 MCG tablet Take 1,000 mcg by mouth daily.  . cycloSPORINE (RESTASIS) 0.05 % ophthalmic emulsion Place 1 drop into both eyes 2 (two) times daily.  . dorzolamide (TRUSOPT) 2 % ophthalmic solution Place 1 drop into both eyes daily.  . ferrous sulfate 325 (65 FE) MG tablet Take 325 mg by mouth daily with breakfast.  . FLUoxetine (PROZAC) 40 MG capsule Take 1 capsule (40 mg total) by mouth daily.  . furosemide (LASIX) 80 MG tablet Take 80 mg by mouth 2 (two) times daily.   [name] gabapentin (NEURONTIN) 100 MG capsule Take 200 mg by mouth 3 (three) times daily.  [name] ipratropium-albuterol (DUONEB) 0.5-2.5 (3) MG/3ML SOLN Take 3 mLs by nebulization every 6 (six) hours as needed (sob/wheezing).   . isosorbide mononitrate (IMDUR) 60 MG 24 hr tablet Take 1 tablet (60 mg total) by mouth daily.  [name] levalbuterol (XOPENEX) 0.63 MG/3ML nebulizer solution Take 0.63 mg by nebulization every 6 (six) hours as needed for wheezing or  shortness of breath. Inhale 0.49m once daily.  May take 0.621mevery six hours as needed COPD.  . magnesium oxide (MAG-OX) 400 MG tablet Take 1 tablet (400 mg total) by mouth 2 (two) times daily.  . mycophenolate (MYFORTIC) 180 MG EC tablet Take 180 mg by mouth 2 (two) times daily.  . [name] KitchenxyCODONE (ROXICODONE) 5 MG immediate release tablet Take 1 tablet (5 mg total)  by mouth every 6 (six) hours as needed for up to 15 doses for breakthrough pain.  . pantoprazole (PROTONIX) 40 MG tablet Take 1 tablet (40 mg total) by mouth 2 (two) times daily.  . predniSONE (DELTASONE) 5 MG tablet Take 7.5 mg by mouth daily with breakfast.  . PROGRAF 1 MG capsule Take 1-2 mg by mouth daily. 65m39mn the morning 1mg65m the afternoon  . promethazine (PHENERGAN) 25 MG tablet Take 1 tablet (25 mg total) by mouth every 8 (eight) hours as needed for up to 15 doses for nausea or vomiting.  . TRAVATAN Z 0.004 % SOLN ophthalmic solution Place 1 drop into both eyes at bedtime.  . valACYclovir (VALTREX) 500 MG tablet Take 500 mg by mouth 2 (two) times daily.  . verapamil (CALAN-SR) 240 MG CR tablet Take 240 mg by mouth daily.  . alMarland Kitchenopurinol (ZYLOPRIM) 100 MG tablet Take 1 tablet (100 mg total) by mouth daily.  . [DISCONTINUED] predniSONE (DELTASONE) 20 MG tablet Take 60 mg daily x 2 days then 40 mg daily x 2 days then 20 mg daily x 2 days   No facility-administered encounter medications on file as of 10/15/2020.   Physical Exam: Blood pressure (!) 118/58, pulse 89, temperature (!) 97.2 F (36.2 C), temperature source Skin, height 5' 1"  (1.549 m), weight 152 lb (68.9 kg), SpO2 99 %. Gen:      No acute distress HEENT:  EOMI, sclera anicteric Neck:     No masses; no thyromegaly Lungs:    Clear to auscultation bilaterally; normal respiratory effort CV:         Regular rate and rhythm; no murmurs Abd:      + bowel sounds; soft, non-tender; no palpable masses, no distension Ext:    No edema; adequate peripheral perfusion Skin:      Warm and dry; no rash Neuro: alert and oriented x 3 Psych: normal mood and affect  Data Reviewed: Imaging: CT high res 07/27/14 No ILD.  CT chest 05/14/2020-bilateral effusions with compressive basal atelectasis, linear scarring. I have reviewed the images personally.  PFTs: 08/08/14 FVC 1.66 [52%] , FEV1 1.35 (57%], F/F 81, RV/TLC elevated. Mild  obstructive small airway disease, hyperinflation, air trapping.  10/26/16 FVC 1.41 [47%), FEV1 1.20 (52%), F/F 85, TLC 68%, RV/TLC 127%, DLCO 71% Minimal obstructive airway disease, hyperinflation, mild restriction and DLCO impairment  FENO 08/26/16- 13  ACT score 08/16/2019-16 Act score 10/15/2020-19  Labs: CBC with differential 08/16/2019-9.8, eosinophil percentage 2.1, absolute eosinophil count 206  Blood allergy profile 08/26/16- Negative, IgE 4  Hypersensitivity panel 08/16/2019-negative Mold allergy profile 08/16/2019-negative IgE 08/16/2019-less than 2  Assessment:  Asthma Has worsening control over the past few years.  She had mold exposure about a year ago but that is remediated and HP panel is negative  Dyspnea is multifactorial secondary to multiple medical issues including heart failure, chronic kidney disease in the setting of renal transplant and asthma Since she has had at least 4 admissions for asthma exacerbation over the year among her 15 admissions we can attempt a biologic to see if  it will improve her quality of life.  She has chronically low eosinophils and IgE as she is on prednisone for renal transplant Start paperwork for [facility]  She may need to see palliative care given her decline in quality of life and recurrent hospitalizations  GERD Continue PPI  Plan/Recommendations: - Continue Symbicort - Paperwork for Dupixent  Marshell Garfinkel MD [facility] 10/15/2020, 11:00 AM  CC: [name], MD

## 2020-10-15 NOTE — Telephone Encounter (Signed)
Working on [employer] Prior authorization, Plan asks:  Has the patient had a trial and failure, contraindication, or intolerance to ONE of the following: Fasenra (benralizumab) OR Nucala (mepolizumab)  Would you like to try one of the Formulary preferred or does patient have clinical reasoning why she is unable to ?  Thanks! Jazarah Capili

## 2020-10-22 NOTE — Telephone Encounter (Signed)
Received a fax regarding Prior Authorization from [facility] for [facility]. Authorization has been DENIED because patient's Eosinophils are below 150 cells per microliter.  Phone# [phone]   Will submit appeal with information Dr. Vaughan Browner stated and documentation of past EOS levels prior to chronic steroid use.

## 2020-10-23 ENCOUNTER — Other Ambulatory Visit: Payer: Self-pay

## 2020-10-23 ENCOUNTER — Emergency Department (HOSPITAL_COMMUNITY): Payer: Medicare Other

## 2020-10-23 ENCOUNTER — Observation Stay (HOSPITAL_COMMUNITY): Payer: Medicare Other

## 2020-10-23 ENCOUNTER — Inpatient Hospital Stay (HOSPITAL_COMMUNITY)
Admission: EM | Admit: 2020-10-23 | Discharge: 2020-10-26 | DRG: 864 | Disposition: A | Discharge status: Home Health | Payer: Medicare Other | Attending: Internal Medicine | Admitting: Internal Medicine

## 2020-10-23 ENCOUNTER — Encounter (HOSPITAL_COMMUNITY): Payer: Self-pay

## 2020-10-23 DIAGNOSIS — J9610 Chronic respiratory failure, unspecified whether with hypoxia or hypercapnia: Secondary | ICD-10-CM | POA: Diagnosis present

## 2020-10-23 DIAGNOSIS — E785 Hyperlipidemia, unspecified: Secondary | ICD-10-CM | POA: Diagnosis present

## 2020-10-23 DIAGNOSIS — N179 Acute kidney failure, unspecified: Secondary | ICD-10-CM

## 2020-10-23 DIAGNOSIS — Z7951 Long term (current) use of inhaled steroids: Secondary | ICD-10-CM

## 2020-10-23 DIAGNOSIS — E860 Dehydration: Secondary | ICD-10-CM | POA: Diagnosis present

## 2020-10-23 DIAGNOSIS — N2581 Secondary hyperparathyroidism of renal origin: Secondary | ICD-10-CM | POA: Diagnosis present

## 2020-10-23 DIAGNOSIS — Z96642 Presence of left artificial hip joint: Secondary | ICD-10-CM | POA: Diagnosis present

## 2020-10-23 DIAGNOSIS — H409 Unspecified glaucoma: Secondary | ICD-10-CM | POA: Diagnosis present

## 2020-10-23 DIAGNOSIS — M1990 Unspecified osteoarthritis, unspecified site: Secondary | ICD-10-CM | POA: Diagnosis present

## 2020-10-23 DIAGNOSIS — Z79899 Other long term (current) drug therapy: Secondary | ICD-10-CM

## 2020-10-23 DIAGNOSIS — Z8271 Family history of polycystic kidney: Secondary | ICD-10-CM

## 2020-10-23 DIAGNOSIS — K219 Gastro-esophageal reflux disease without esophagitis: Secondary | ICD-10-CM | POA: Diagnosis present

## 2020-10-23 DIAGNOSIS — Z8249 Family history of ischemic heart disease and other diseases of the circulatory system: Secondary | ICD-10-CM

## 2020-10-23 DIAGNOSIS — F419 Anxiety disorder, unspecified: Secondary | ICD-10-CM | POA: Diagnosis present

## 2020-10-23 DIAGNOSIS — J449 Chronic obstructive pulmonary disease, unspecified: Secondary | ICD-10-CM | POA: Diagnosis present

## 2020-10-23 DIAGNOSIS — Z885 Allergy status to narcotic agent status: Secondary | ICD-10-CM

## 2020-10-23 DIAGNOSIS — R109 Unspecified abdominal pain: Secondary | ICD-10-CM

## 2020-10-23 DIAGNOSIS — N1831 Chronic kidney disease, stage 3a: Secondary | ICD-10-CM | POA: Diagnosis present

## 2020-10-23 DIAGNOSIS — Z888 Allergy status to other drugs, medicaments and biological substances status: Secondary | ICD-10-CM

## 2020-10-23 DIAGNOSIS — I5032 Chronic diastolic (congestive) heart failure: Secondary | ICD-10-CM | POA: Diagnosis present

## 2020-10-23 DIAGNOSIS — M109 Gout, unspecified: Secondary | ICD-10-CM | POA: Diagnosis present

## 2020-10-23 DIAGNOSIS — R509 Fever, unspecified: Principal | ICD-10-CM | POA: Diagnosis present

## 2020-10-23 DIAGNOSIS — Z20822 Contact with and (suspected) exposure to covid-19: Secondary | ICD-10-CM | POA: Diagnosis present

## 2020-10-23 DIAGNOSIS — R269 Unspecified abnormalities of gait and mobility: Secondary | ICD-10-CM | POA: Diagnosis present

## 2020-10-23 DIAGNOSIS — R0602 Shortness of breath: Secondary | ICD-10-CM

## 2020-10-23 DIAGNOSIS — E871 Hypo-osmolality and hyponatremia: Secondary | ICD-10-CM | POA: Diagnosis not present

## 2020-10-23 DIAGNOSIS — I130 Hypertensive heart and chronic kidney disease with heart failure and stage 1 through stage 4 chronic kidney disease, or unspecified chronic kidney disease: Secondary | ICD-10-CM | POA: Diagnosis present

## 2020-10-23 DIAGNOSIS — Z87891 Personal history of nicotine dependence: Secondary | ICD-10-CM

## 2020-10-23 DIAGNOSIS — R112 Nausea with vomiting, unspecified: Secondary | ICD-10-CM | POA: Diagnosis not present

## 2020-10-23 DIAGNOSIS — Z7952 Long term (current) use of systemic steroids: Secondary | ICD-10-CM

## 2020-10-23 DIAGNOSIS — Z940 Kidney transplant status: Secondary | ICD-10-CM

## 2020-10-23 DIAGNOSIS — D631 Anemia in chronic kidney disease: Secondary | ICD-10-CM | POA: Diagnosis present

## 2020-10-23 DIAGNOSIS — Z881 Allergy status to other antibiotic agents status: Secondary | ICD-10-CM

## 2020-10-23 LAB — COMPREHENSIVE METABOLIC PANEL
ALT: 15 U/L (ref 0–44)
AST: 15 U/L (ref 15–41)
Albumin: 3 g/dL — ABNORMAL LOW (ref 3.5–5.0)
Alkaline Phosphatase: 76 U/L (ref 38–126)
Anion gap: 11 (ref 5–15)
BUN: 56 mg/dL — ABNORMAL HIGH (ref 8–23)
CO2: 26 mmol/L (ref 22–32)
Calcium: 9.6 mg/dL (ref 8.9–10.3)
Chloride: 97 mmol/L — ABNORMAL LOW (ref 98–111)
Creatinine, Ser: 1.17 mg/dL — ABNORMAL HIGH (ref 0.44–1.00)
GFR, Estimated: 52 mL/min — ABNORMAL LOW (ref >60)
Glucose, Bld: 121 mg/dL — ABNORMAL HIGH (ref 70–99)
Potassium: 3.8 mmol/L (ref 3.5–5.1)
Sodium: 134 mmol/L — ABNORMAL LOW (ref 135–145)
Total Bilirubin: 1.5 mg/dL — ABNORMAL HIGH (ref 0.3–1.2)
Total Protein: 7.4 g/dL (ref 6.5–8.1)

## 2020-10-23 LAB — RESP PANEL BY RT-PCR (FLU A&B, COVID) ARPGX2
Influenza A by PCR: NEGATIVE
Influenza B by PCR: NEGATIVE
SARS Coronavirus 2 by RT PCR: NEGATIVE

## 2020-10-23 LAB — BLOOD GAS, ARTERIAL
Acid-base deficit: 1.8 mmol/L (ref 0.0–2.0)
Bicarbonate: 23.8 mmol/L (ref 20.0–28.0)
O2 Saturation: 100 %
Patient temperature: 97.6
pCO2 arterial: 46.9 mmHg (ref 32.0–48.0)
pH, Arterial: 7.323 — ABNORMAL LOW (ref 7.350–7.450)
pO2, Arterial: 185 mmHg — ABNORMAL HIGH (ref 83.0–108.0)

## 2020-10-23 LAB — CBC WITH DIFFERENTIAL/PLATELET
Abs Immature Granulocytes: 0.06 10*3/uL (ref 0.00–0.07)
Basophils Absolute: 0 10*3/uL (ref 0.0–0.1)
Basophils Relative: 0 %
Eosinophils Absolute: 0.1 10*3/uL (ref 0.0–0.5)
Eosinophils Relative: 1 %
HCT: 32.1 % — ABNORMAL LOW (ref 36.0–46.0)
Hemoglobin: 9.6 g/dL — ABNORMAL LOW (ref 12.0–15.0)
Immature Granulocytes: 1 %
Lymphocytes Relative: 9 %
Lymphs Abs: 1.1 10*3/uL (ref 0.7–4.0)
MCH: 31.3 pg (ref 26.0–34.0)
MCHC: 29.9 g/dL — ABNORMAL LOW (ref 30.0–36.0)
MCV: 104.6 fL — ABNORMAL HIGH (ref 80.0–100.0)
Monocytes Absolute: 0.3 10*3/uL (ref 0.1–1.0)
Monocytes Relative: 3 %
Neutro Abs: 10.8 10*3/uL — ABNORMAL HIGH (ref 1.7–7.7)
Neutrophils Relative %: 86 %
Platelets: 155 10*3/uL (ref 150–400)
RBC: 3.07 MIL/uL — ABNORMAL LOW (ref 3.87–5.11)
RDW: 19.8 % — ABNORMAL HIGH (ref 11.5–15.5)
WBC: 12.4 10*3/uL — ABNORMAL HIGH (ref 4.0–10.5)
nRBC: 0 % (ref 0.0–0.2)

## 2020-10-23 LAB — URINALYSIS, ROUTINE W REFLEX MICROSCOPIC
Bilirubin Urine: NEGATIVE
Glucose, UA: NEGATIVE mg/dL
Ketones, ur: NEGATIVE mg/dL
Leukocytes,Ua: NEGATIVE
Nitrite: NEGATIVE
Protein, ur: 30 mg/dL — AB
Specific Gravity, Urine: 1.01 (ref 1.005–1.030)
pH: 5 (ref 5.0–8.0)

## 2020-10-23 LAB — LIPASE, BLOOD: Lipase: 23 U/L (ref 11–51)

## 2020-10-23 LAB — TROPONIN I (HIGH SENSITIVITY): Troponin I (High Sensitivity): 7 ng/L (ref <18)

## 2020-10-23 LAB — LACTIC ACID, PLASMA: Lactic Acid, Venous: 1 mmol/L (ref 0.5–1.9)

## 2020-10-23 LAB — BRAIN NATRIURETIC PEPTIDE: B Natriuretic Peptide: 436 pg/mL — ABNORMAL HIGH (ref 0.0–100.0)

## 2020-10-23 LAB — D-DIMER, QUANTITATIVE: D-Dimer, Quant: 3.08 ug/mL-FEU — ABNORMAL HIGH (ref 0.00–0.50)

## 2020-10-23 IMAGING — CR DG ABDOMEN ACUTE W/ 1V CHEST
3 series · 3 of 3 positions shown · non-contrast
Comparison: CT abdomen pelvis dated March 31, 2019. Chest x-ray dated

CLINICAL DATA: Cough, fever, shortness of breath, and vomiting.

EXAM:
DG ABDOMEN ACUTE W/ 1V CHEST

[x chest ap]
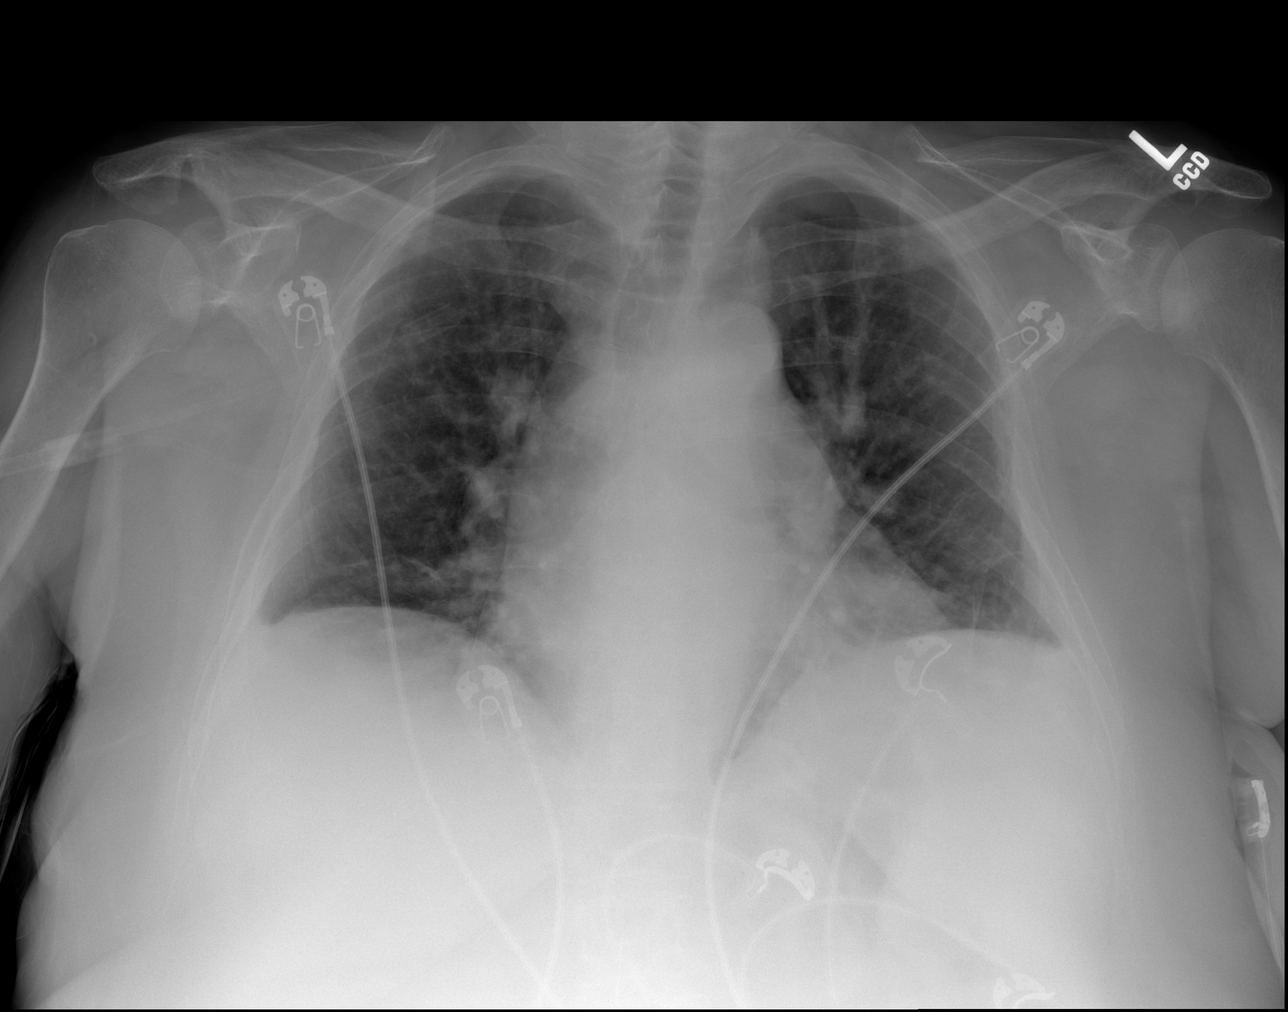

[x abdomen erect]
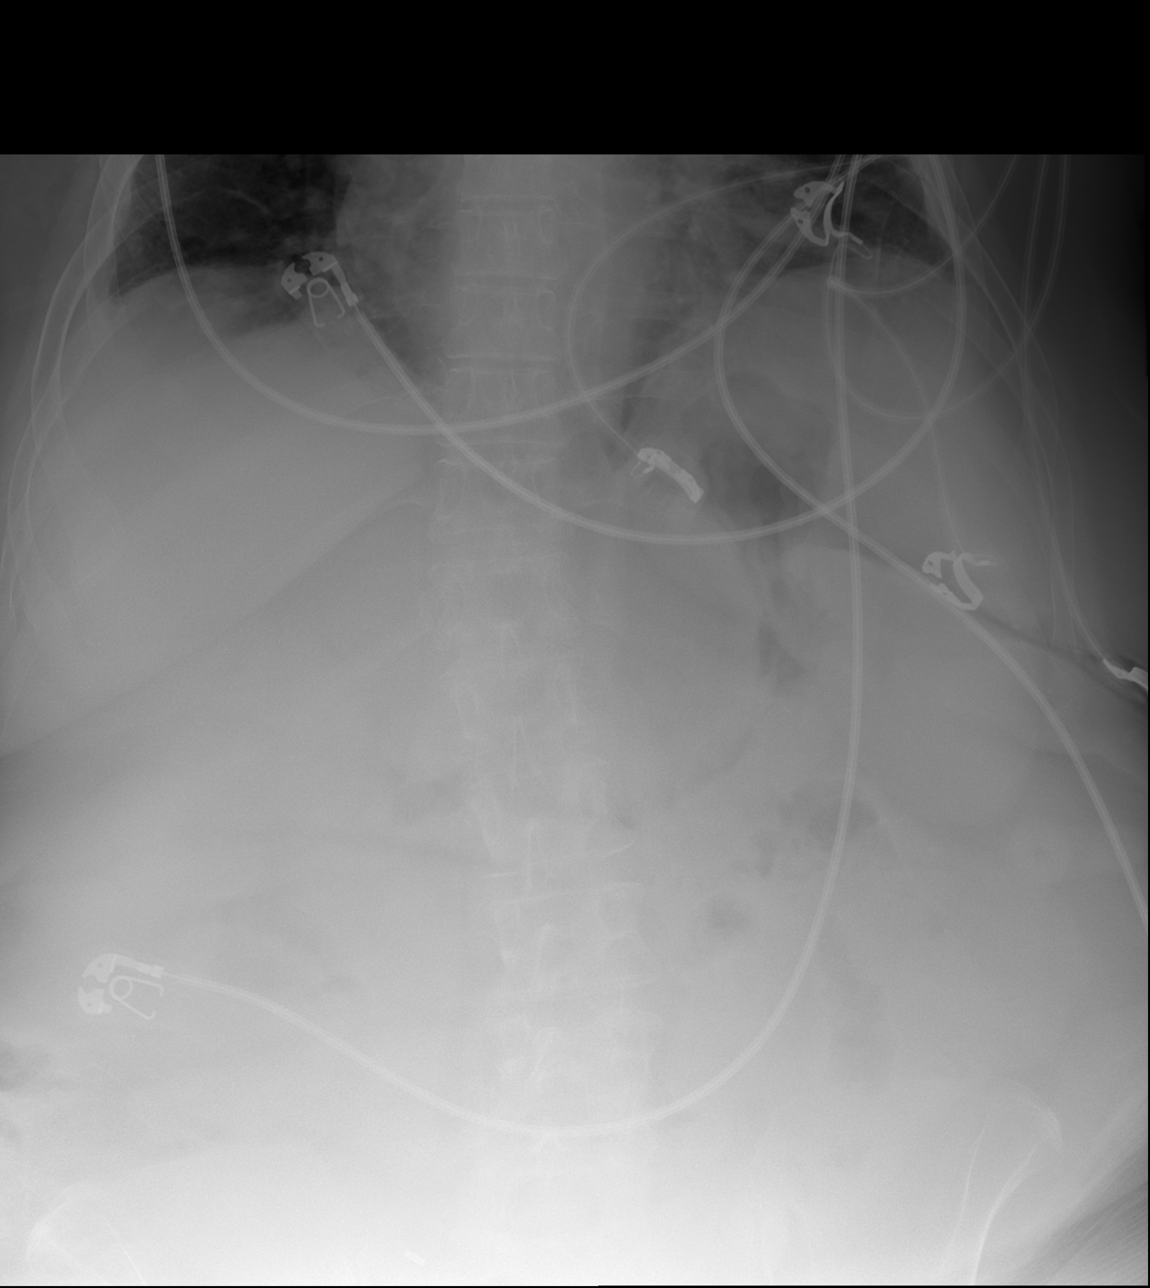

[x abdomen supine]
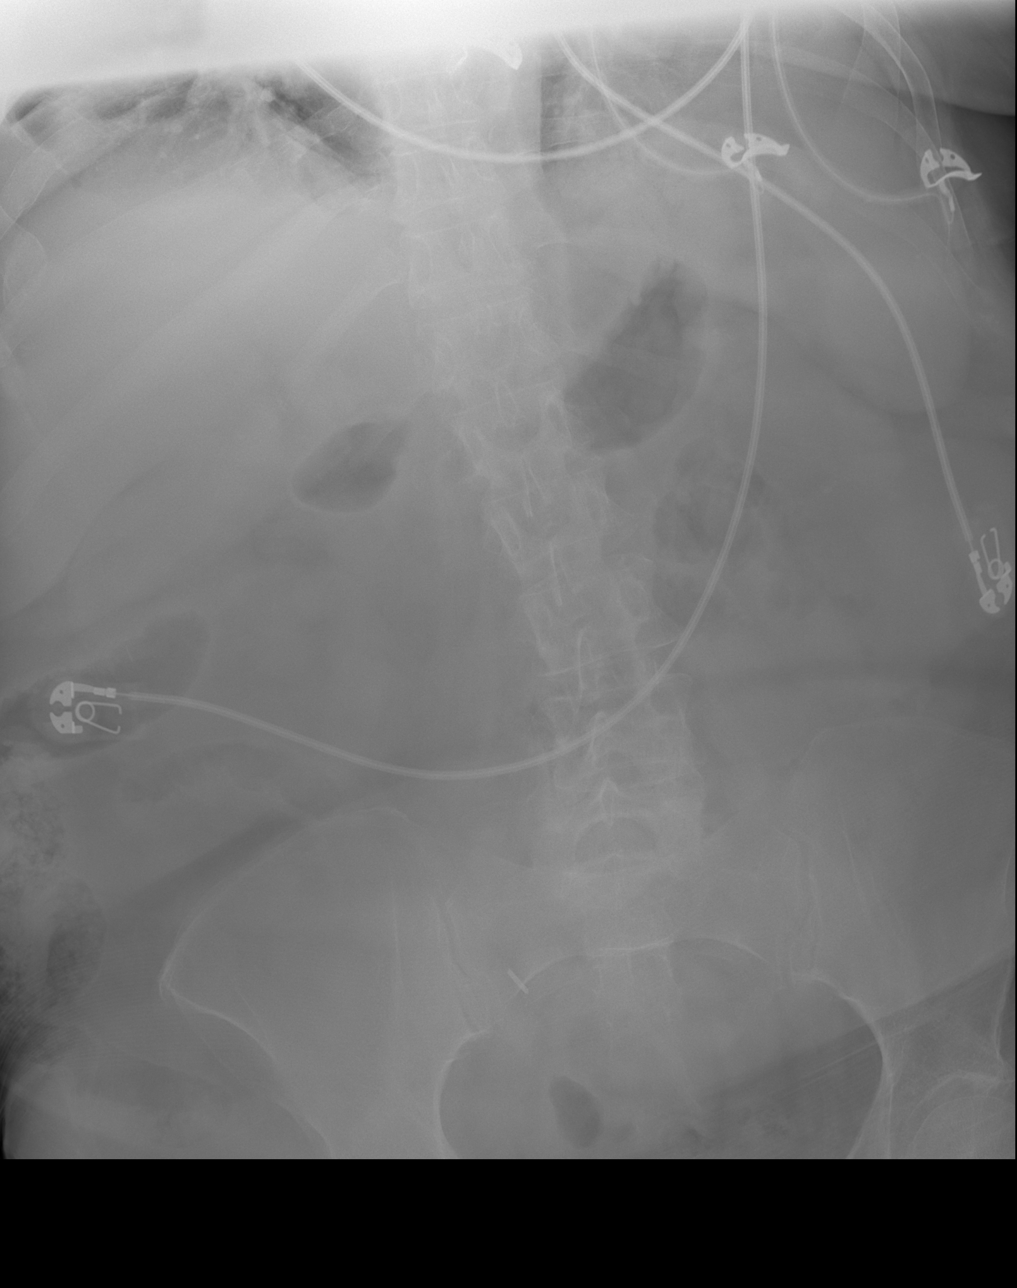

[3 of 3 positions shown; findings below may reference images not displayed]

FINDINGS: The cardiomediastinal silhouette is normal in size. New mild
pulmonary vascular congestion. No focal consolidation, pleural
effusion, or pneumothorax. Unchanged linear bibasilar
atelectasis/scarring. No acute osseous abnormality.

There is no evidence of dilated bowel loops or free intraperitoneal
air. No radiopaque calculi or other significant radiographic
abnormality is seen. Unchanged splenomegaly.
IMPRESSION: 1. No acute abnormality in the abdomen.
2. Unchanged splenomegaly.
3. New mild pulmonary vascular congestion.

## 2020-10-23 IMAGING — CT CT HEAD WITHOUT CONTRAST
3 series · 15 of 45 positions shown, 18 images · non-contrast
Comparison: CT head dated 12/27/2018

CLINICAL DATA: Headache

EXAM:
CT HEAD WITHOUT CONTRAST
TECHNIQUE: Contiguous axial images were obtained from the base of the skull
through the vertex without intravenous contrast.

[Series 2: head wo · axial · 0.47mm/px · z∈[+1265,+1380]mm · 9 of 28 slices shown, 12 images]
[im 3/28  brain]
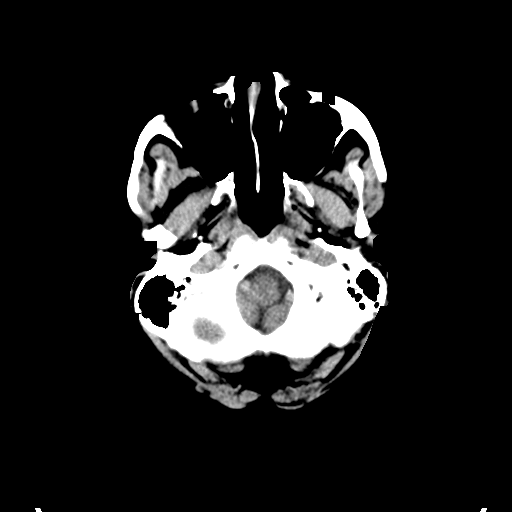
[im 3/28  bone]
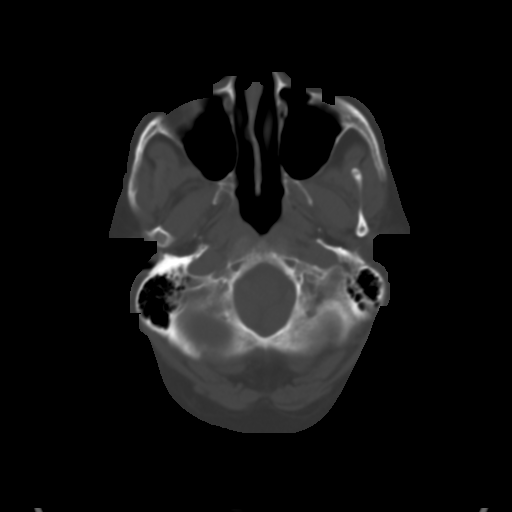
[im 6/28  brain]
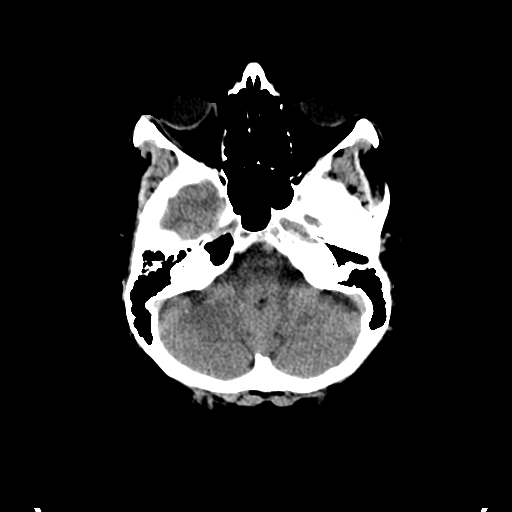
[im 9/28  brain]
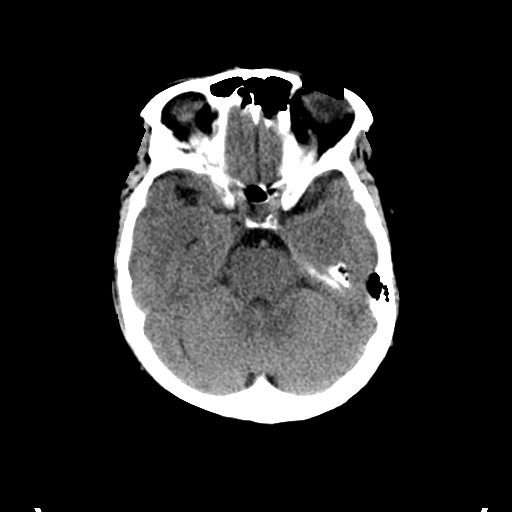
[im 12/28  brain]
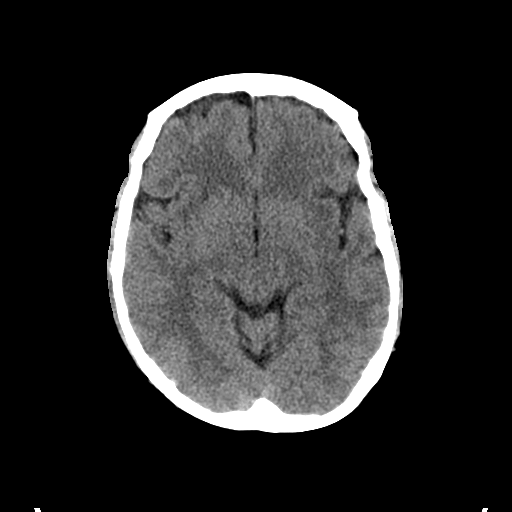
[im 15/28  brain]
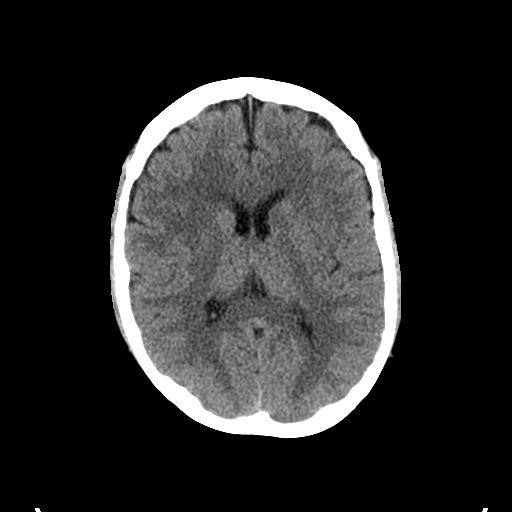
[im 15/28  bone]
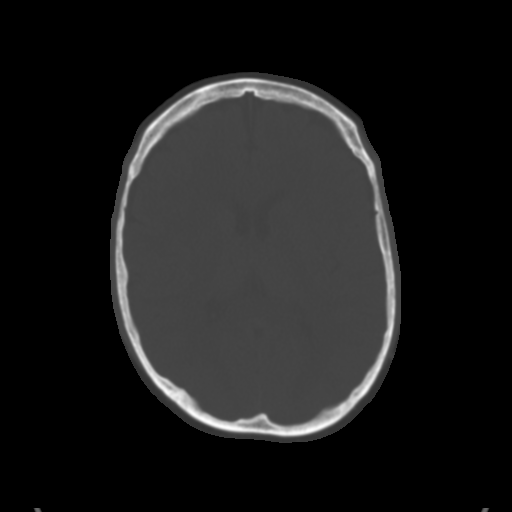
[im 17/28  brain]
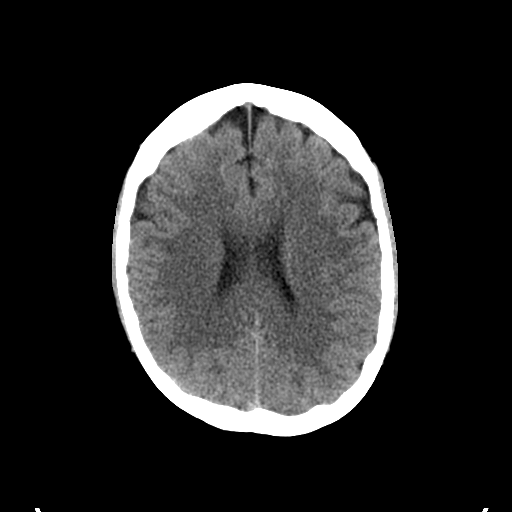
[im 20/28  brain]
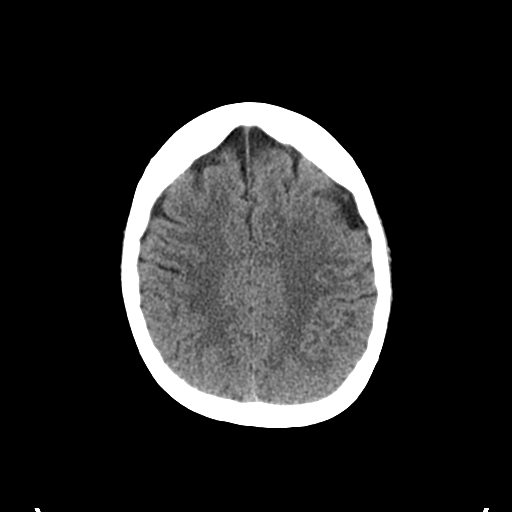
[im 23/28  brain]
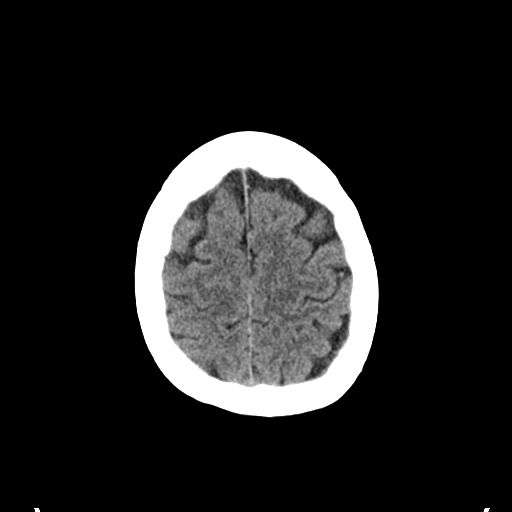
[im 26/28  brain]
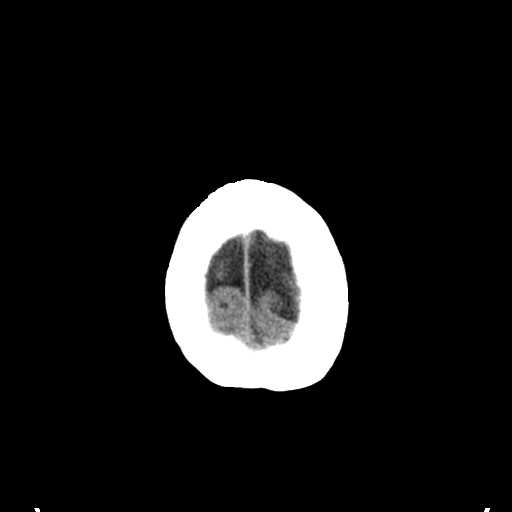
[im 26/28  bone]
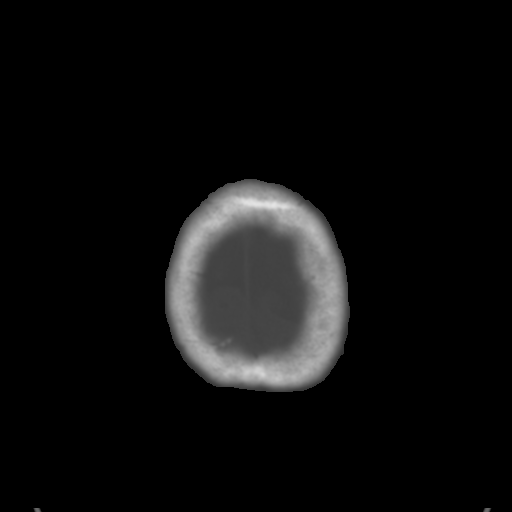

[Series 4: coronal soft tissue · coronal · 0.39mm/px · 3 of 60 slices shown]
[im 20/60  brain]
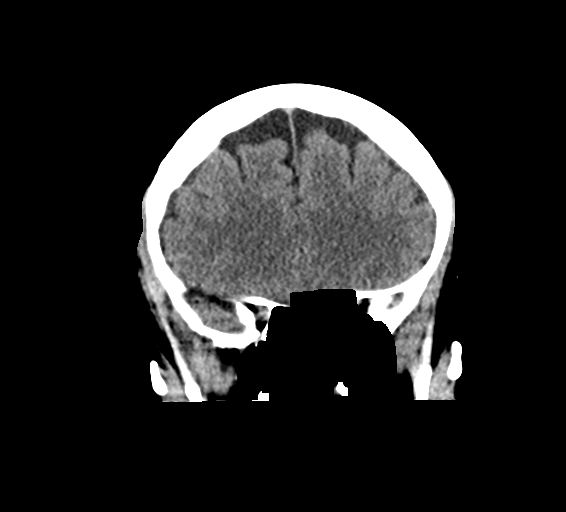
[im 27/60  brain]
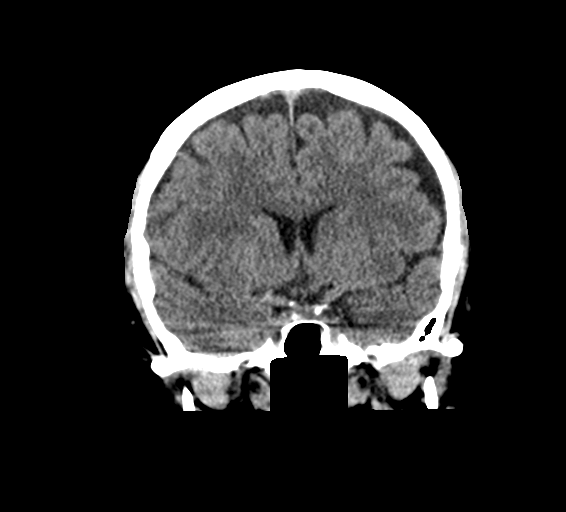
[im 33/60  brain]
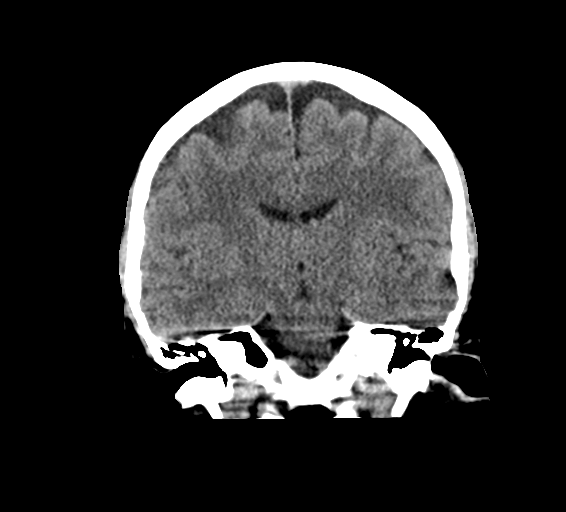

[Series 5: sagittal soft tissue · sagittal · 0.39mm/px · 3 of 49 slices shown]
[im 17/49  brain]
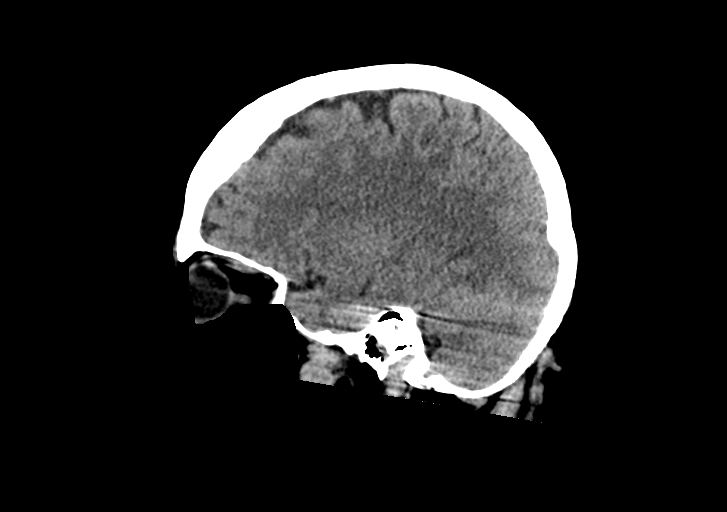
[im 25/49  brain]
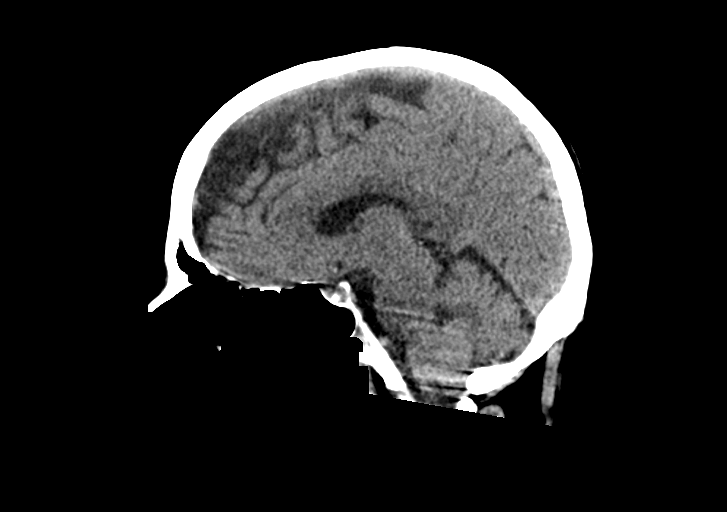
[im 33/49  brain]
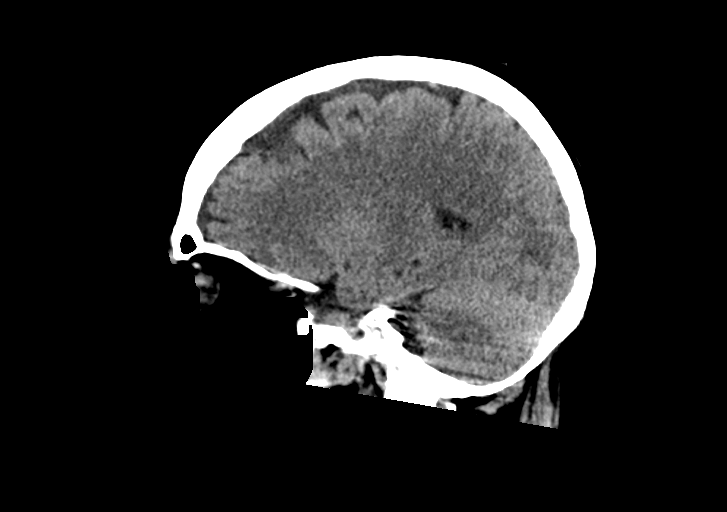

[15 of 45 positions shown; findings below may reference images not displayed]

FINDINGS: Brain: No evidence of acute infarction, hemorrhage, hydrocephalus,
extra-axial collection or mass lesion/mass effect.

Vascular: No hyperdense vessel or unexpected calcification.

Skull: Normal. Negative for fracture or focal lesion.

Sinuses/Orbits: No acute finding. The patient is status post
bilateral cataract surgery.

Other: None.
IMPRESSION: No acute intracranial abnormality detected.

## 2020-10-23 MED ORDER — VALACYCLOVIR HCL 500 MG PO TABS
500.0000 mg | ORAL_TABLET | Freq: Two times a day (BID) | ORAL | Status: DC
  Administered 2020-10-23 – 2020-10-26 (×6): 500 mg via ORAL
  Filled 2020-10-23 (×6): qty 1

## 2020-10-23 MED ORDER — TACROLIMUS 1 MG PO CAPS
1.0000 mg | ORAL_CAPSULE | Freq: Two times a day (BID) | ORAL | Status: DC
  Administered 2020-10-23 – 2020-10-26 (×6): 1 mg via ORAL
  Filled 2020-10-23 (×6): qty 1

## 2020-10-23 MED ORDER — ACETAMINOPHEN 325 MG PO TABS
650.0000 mg | ORAL_TABLET | Freq: Four times a day (QID) | ORAL | Status: DC | PRN
  Administered 2020-10-24 – 2020-10-26 (×2): 650 mg via ORAL
  Filled 2020-10-23 (×2): qty 2

## 2020-10-23 MED ORDER — ALUM & MAG HYDROXIDE-SIMETH 200-200-20 MG/5ML PO SUSP
30.0000 mL | Freq: Once | ORAL | Status: AC
  Administered 2020-10-23: 30 mL via ORAL
  Filled 2020-10-23: qty 30

## 2020-10-23 MED ORDER — BRIMONIDINE TARTRATE 0.15 % OP SOLN
1.0000 [drp] | Freq: Two times a day (BID) | OPHTHALMIC | Status: DC
  Administered 2020-10-23 – 2020-10-26 (×6): 1 [drp] via OPHTHALMIC
  Filled 2020-10-23: qty 5

## 2020-10-23 MED ORDER — PANTOPRAZOLE SODIUM 40 MG PO TBEC
40.0000 mg | DELAYED_RELEASE_TABLET | Freq: Two times a day (BID) | ORAL | Status: DC
  Administered 2020-10-23 – 2020-10-26 (×6): 40 mg via ORAL
  Filled 2020-10-23 (×6): qty 1

## 2020-10-23 MED ORDER — BUDESONIDE 0.5 MG/2ML IN SUSP
0.5000 mg | Freq: Two times a day (BID) | RESPIRATORY_TRACT | Status: DC
  Administered 2020-10-23 – 2020-10-26 (×6): 0.5 mg via RESPIRATORY_TRACT
  Filled 2020-10-23 (×6): qty 2

## 2020-10-23 MED ORDER — VERAPAMIL HCL ER 240 MG PO TBCR
240.0000 mg | EXTENDED_RELEASE_TABLET | Freq: Every day | ORAL | Status: DC
  Administered 2020-10-23: 240 mg via ORAL
  Filled 2020-10-23 (×2): qty 1

## 2020-10-23 MED ORDER — ACETAMINOPHEN 325 MG PO TABS
650.0000 mg | ORAL_TABLET | Freq: Once | ORAL | Status: DC
  Filled 2020-10-23: qty 2

## 2020-10-23 MED ORDER — PROMETHAZINE HCL 25 MG/ML IJ SOLN
12.5000 mg | Freq: Four times a day (QID) | INTRAMUSCULAR | Status: DC | PRN
  Filled 2020-10-23: qty 1

## 2020-10-23 MED ORDER — BUDESON-GLYCOPYRROL-FORMOTEROL 160-9-4.8 MCG/ACT IN AERO: 2.0000 | INHALATION_SPRAY | Freq: Two times a day (BID) | RESPIRATORY_TRACT | Status: DC

## 2020-10-23 MED ORDER — ISOSORBIDE MONONITRATE ER 60 MG PO TB24: 60.0000 mg | ORAL_TABLET | Freq: Every day | ORAL | Status: DC

## 2020-10-23 MED ORDER — PIPERACILLIN-TAZOBACTAM 3.375 G IVPB
3.3750 g | Freq: Three times a day (TID) | INTRAVENOUS | Status: DC
  Administered 2020-10-23 – 2020-10-25 (×6): 3.375 g via INTRAVENOUS
  Filled 2020-10-23 (×7): qty 50

## 2020-10-23 MED ORDER — CYCLOSPORINE 0.05 % OP EMUL
1.0000 [drp] | Freq: Two times a day (BID) | OPHTHALMIC | Status: DC
  Administered 2020-10-23 – 2020-10-26 (×6): 1 [drp] via OPHTHALMIC
  Filled 2020-10-23 (×6): qty 1

## 2020-10-23 MED ORDER — IPRATROPIUM-ALBUTEROL 0.5-2.5 (3) MG/3ML IN SOLN
3.0000 mL | Freq: Four times a day (QID) | RESPIRATORY_TRACT | Status: DC | PRN
  Administered 2020-10-23: 3 mL via RESPIRATORY_TRACT

## 2020-10-23 MED ORDER — LEVALBUTEROL HCL 0.63 MG/3ML IN NEBU: 0.6300 mg | INHALATION_SOLUTION | Freq: Four times a day (QID) | RESPIRATORY_TRACT | Status: DC | PRN

## 2020-10-23 MED ORDER — METHYLPREDNISOLONE SODIUM SUCC 125 MG IJ SOLR
60.0000 mg | Freq: Once | INTRAMUSCULAR | Status: AC
  Administered 2020-10-23: 60 mg via INTRAVENOUS
  Filled 2020-10-23: qty 2

## 2020-10-23 MED ORDER — MAGNESIUM OXIDE 400 (241.3 MG) MG PO TABS
400.0000 mg | ORAL_TABLET | Freq: Two times a day (BID) | ORAL | Status: DC
  Administered 2020-10-23 – 2020-10-26 (×6): 400 mg via ORAL
  Filled 2020-10-23 (×6): qty 1

## 2020-10-23 MED ORDER — FLUOXETINE HCL 20 MG PO CAPS
40.0000 mg | ORAL_CAPSULE | Freq: Every day | ORAL | Status: DC
  Administered 2020-10-24 – 2020-10-26 (×3): 40 mg via ORAL
  Filled 2020-10-23 (×3): qty 2

## 2020-10-23 MED ORDER — PREDNISONE 5 MG PO TABS
7.5000 mg | ORAL_TABLET | Freq: Every day | ORAL | Status: DC
  Administered 2020-10-24 – 2020-10-26 (×3): 7.5 mg via ORAL
  Filled 2020-10-23 (×3): qty 2

## 2020-10-23 MED ORDER — SODIUM CHLORIDE 0.9 % IV BOLUS
1000.0000 mL | Freq: Once | INTRAVENOUS | Status: AC
  Administered 2020-10-23: 1000 mL via INTRAVENOUS

## 2020-10-23 MED ORDER — DORZOLAMIDE HCL 2 % OP SOLN
1.0000 [drp] | Freq: Every day | OPHTHALMIC | Status: DC
  Administered 2020-10-24 – 2020-10-26 (×4): 1 [drp] via OPHTHALMIC
  Filled 2020-10-23: qty 10

## 2020-10-23 MED ORDER — PIPERACILLIN-TAZOBACTAM 4.5 G IVPB
4.5000 g | Freq: Once | INTRAVENOUS | Status: DC
Start: 2020-10-23 — End: 2020-10-23

## 2020-10-23 MED ORDER — ENOXAPARIN SODIUM 40 MG/0.4ML ~~LOC~~ SOLN
40.0000 mg | SUBCUTANEOUS | Status: DC
  Administered 2020-10-23 – 2020-10-25 (×3): 40 mg via SUBCUTANEOUS
  Filled 2020-10-23 (×3): qty 0.4

## 2020-10-23 MED ORDER — PIPERACILLIN-TAZOBACTAM 3.375 G IVPB 30 MIN
3.3750 g | Freq: Once | INTRAVENOUS | Status: AC
  Administered 2020-10-23: 3.375 g via INTRAVENOUS
  Filled 2020-10-23: qty 50

## 2020-10-23 MED ORDER — MYCOPHENOLATE SODIUM 180 MG PO TBEC
180.0000 mg | DELAYED_RELEASE_TABLET | Freq: Two times a day (BID) | ORAL | Status: DC
  Administered 2020-10-23 – 2020-10-26 (×6): 180 mg via ORAL
  Filled 2020-10-23 (×6): qty 1

## 2020-10-23 MED ORDER — LATANOPROST 0.005 % OP SOLN
1.0000 [drp] | Freq: Every day | OPHTHALMIC | Status: DC
  Administered 2020-10-23 – 2020-10-25 (×3): 1 [drp] via OPHTHALMIC
  Filled 2020-10-23: qty 2.5

## 2020-10-23 MED ORDER — IPRATROPIUM-ALBUTEROL 0.5-2.5 (3) MG/3ML IN SOLN
3.0000 mL | Freq: Four times a day (QID) | RESPIRATORY_TRACT | Status: DC
  Administered 2020-10-23 – 2020-10-26 (×11): 3 mL via RESPIRATORY_TRACT
  Filled 2020-10-23 (×12): qty 3

## 2020-10-23 MED ORDER — GABAPENTIN 100 MG PO CAPS
200.0000 mg | ORAL_CAPSULE | Freq: Three times a day (TID) | ORAL | Status: DC
  Administered 2020-10-23 – 2020-10-26 (×8): 200 mg via ORAL
  Filled 2020-10-23 (×8): qty 2

## 2020-10-23 MED ORDER — GUAIFENESIN ER 600 MG PO TB12
600.0000 mg | ORAL_TABLET | Freq: Two times a day (BID) | ORAL | Status: DC
  Administered 2020-10-23 – 2020-10-26 (×6): 600 mg via ORAL
  Filled 2020-10-23 (×6): qty 1

## 2020-10-23 MED ORDER — ONDANSETRON HCL 4 MG/2ML IJ SOLN
4.0000 mg | Freq: Once | INTRAMUSCULAR | Status: AC
  Administered 2020-10-23: 4 mg via INTRAVENOUS
  Filled 2020-10-23: qty 2

## 2020-10-23 MED ORDER — ACETAMINOPHEN 650 MG RE SUPP: 650.0000 mg | Freq: Four times a day (QID) | RECTAL | Status: DC | PRN

## 2020-10-23 MED ORDER — VITAMIN B-12 1000 MCG PO TABS
1000.0000 ug | ORAL_TABLET | Freq: Every day | ORAL | Status: DC
  Administered 2020-10-24 – 2020-10-26 (×3): 1000 ug via ORAL
  Filled 2020-10-23 (×3): qty 1

## 2020-10-23 NOTE — Progress Notes (Signed)
Patient arrived to the floor very drowsy responding only after name being called multiple times. Patient is endorsing SOB even though her oxygen saturation level is at 100 percent on 2L of oxygen. The patient is "breathing with her belly". April Ishihara, MD notified and he ordered a STAT VQ scan and to keep the patient's breathing treatments as ordered. Tis RN will continue to monitor.

## 2020-10-23 NOTE — ED Provider Notes (Signed)
[facility] DEPT Provider Note   CSN: [id number] Arrival date & time: 10/23/20  1152     History Chief Complaint  Patient presents with  . Weakness    April [name] is a 64 y.o. female.  Patient with h/o CHF, COPD, Kidney transplant in 2013 on immunosuppressants, presents with fever today Tmax of 100.4.  Complaining of generalized weakness, no chest pain, no cough, no vomiting, no diarrhea.  She is on base-line O2 1-2L at home.        Past Medical History:  Diagnosis Date  . Arthritis   . Asthma   . CHF (congestive heart failure) ([place])   . COPD (chronic obstructive pulmonary disease) ([place])   . Depression   . Essential hypertension 02/08/2020  . GERD (gastroesophageal reflux disease)   . Hyperlipidemia   . Hyperparathyroidism   . Polycystic kidney   . PONV (postoperative nausea and vomiting)     Patient Active Problem List   Diagnosis Date Noted  . Lower GI bleeding 09/25/2020  . Diarrhea 09/10/2020  . Acute renal failure superimposed on stage 3b chronic kidney disease ([place]) 09/08/2020  . Diverticulitis 09/04/2020  . SIRS (systemic inflammatory response syndrome) ([place]) 07/02/2020  . Chronic kidney disease, stage 3a ([place])   . 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 History of immunosuppressive therapy 05/27/2020  . FUO (fever of unknown origin) 05/15/2020  . Immunocompromised state ([place])   . Decubitus ulcer of coccyx, unstageable ([place]) 05/01/2020  . Pericardial effusion 04/16/2020  . Chronic respiratory failure with hypoxia ([place]) 04/16/2020  . Proteinuria 04/16/2020  . Pressure injury of skin 04/08/2020  . COPD (chronic obstructive pulmonary disease) ([place]) 04/07/2020  . SVT (supraventricular tachycardia) ([place])   . Anemia of chronic disease   . Palliative care encounter   . Goals of care, counseling/discussion   . Essential hypertension 02/08/2020  . Glaucoma 02/08/2020  . Sepsis with encephalopathy without septic shock  ([place]) 01/31/2020  . Fracture of left superior pubic ramus (H[facility])   . Pain   . Anxiety state   . Left displaced femoral neck fracture ([place]) 12/15/2019  . Status post total hip replacement, left   . Post-op pain   . Polycystic kidney   . Closed left hip fracture ([place]) 12/07/2019  . Left wrist fracture 12/07/2019  . Medication management 09/22/2019  . Physical deconditioning 09/22/2019  . Acute respiratory failure with hypoxia ([place]) 04/19/2019  . Acute on chronic diastolic CHF (congestive heart failure) ([place]) 04/19/2019  . Bilateral closed proximal tibial fracture 12/21/2018  . Asthma, chronic, unspecified asthma severity, with acute exacerbation 10/03/2018  . Chronic diastolic CHF (congestive heart failure) ([place]) 10/01/2017  . Asthma, mild intermittent 07/15/2016  . GERD without esophagitis 07/15/2016  . Renal transplant recipient 07/15/2016  . Hyperlipidemia 07/15/2016  . Depression 07/15/2016  . Bronchitis, mucopurulent recurrent ([place]) 08/08/2014  . Chronic cough 07/24/2014  . End stage renal disease ([place]) 03/23/2012  . 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 BILATERAL OOPHORECTOMY    . BIOPSY  05/19/2020   Procedure: BIOPSY;  Surgeon: Carol Ada, MD;  Location: [facility] ENDOSCOPY;  Service: Endoscopy;;  . CARPAL TUNNEL RELEASE  2000  . CESAREAN SECTION    . COLONOSCOPY WITH PROPOFOL N/A 05/19/2020   Procedure: COLONOSCOPY WITH PROPOFOL;  Surgeon: Carol Ada, MD;  Location: [facility]  ENDOSCOPY;  Service: Endoscopy;  Laterality: N/A;  . ENTEROSCOPY N/A 05/19/2020   Procedure: ENTEROSCOPY;  Surgeon: Carol Ada, MD;  Location: [facility];  Service: Endoscopy;  Laterality: N/A;  . INSERTION OF DIALYSIS CATHETER  03/31/2012   Procedure: INSERTION OF DIALYSIS CATHETER;  Surgeon: Angelia Mould, MD;  Location: [facility];  Service:  Vascular;  Laterality: N/A;  insertion of dialysis catheter right internal jugular vein  . 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 ORIF TIBIA FRACTURE Left 12/23/2018   Procedure: OPEN REDUCTION INTERNAL FIXATION (ORIF) TIBIA FRACTURE;  Surgeon: Shona Needles, MD;  Location: [facility];  Service: Orthopedics;  Laterality: Left;  . ORIF TIBIA PLATEAU Right 12/23/2018   Procedure: OPEN REDUCTION INTERNAL FIXATION (ORIF) TIBIAL PLATEAU;  Surgeon: Shona Needles, MD;  Location: [facility];  Service: Orthopedics;  Laterality: Right;  . ORIF WRIST FRACTURE Left 12/08/2019   Procedure: OPEN REDUCTION INTERNAL FIXATION (ORIF) WRIST FRACTURE, REPAIR LACERATION LEFT FOREARM;  Surgeon: Dorna Leitz, MD;  Location: [facility];  Service: Orthopedics;  Laterality: Left;  . TONSILLECTOMY  1967  . TOTAL HIP ARTHROPLASTY Left 12/08/2019   Procedure: TOTAL HIP ARTHROPLASTY ANTERIOR APPROACH;  Surgeon: Dorna Leitz, MD;  Location: [facility];  Service: Orthopedics;  Laterality: Left;  . TUBAL LIGATION  2010     OB History   No obstetric history on file.     Family History  Problem Relation Age of Onset  . Hypertension Mother   . Heart disease Father        CABG history  . Polycystic kidney disease Father   . Polycystic kidney disease Brother     Social History   Tobacco Use  . Smoking status: Former Smoker    Packs/day: 0.25    Years: 15.00    Pack years: 3.75    Types: Cigarettes    Quit date: 03/22/1998    Years since quitting: 22.6  . Smokeless tobacco: Never Used  Vaping Use  . Vaping Use: Never used  Substance Use Topics  . Alcohol use: No  . Drug use: No    Home Medications Prior to Admission medications   Medication Sig Start Date End Date Taking? Authorizing Provider  acetaminophen (TYLENOL) 325 MG tablet Take 2 tablets (650 mg total) by mouth every 6 (six) hours as needed for mild pain (or Fever >/= 101). 05/22/20   Elgergawy, Silver Huguenin, MD  albuterol (VENTOLIN HFA) 108 (90 Base) MCG/ACT inhaler  Inhale 2 puffs into the lungs every 4 (four) hours as needed for wheezing or shortness of breath. 01/04/20   Angiulli, Lavon Paganini, PA-C  allopurinol (ZYLOPRIM) 100 MG tablet Take 1 tablet (100 mg total) by mouth daily. 09/12/20 10/12/20  Elodia Florence., MD  ascorbic acid (VITAMIN C) 500 MG tablet Take 500 mg by mouth daily.    [provider]  brimonidine (ALPHAGAN P) 0.1 % SOLN Place 1 drop into both eyes 2 (two) times daily.     [provider]  Budeson-Glycopyrrol-Formoterol (BREZTRI AEROSPHERE) 160-9-4.8 MCG/ACT AERO Inhale 2 puffs into the lungs 2 (two) times daily. 08/20/20   Martyn Ehrich, NP  cholecalciferol (VITAMIN D3) 25 MCG (1000 UNIT) tablet Take 1 tablet (1,000 Units total) by mouth daily. 01/04/20   Angiulli, Lavon Paganini, PA-C  cyanocobalamin 1000 MCG tablet Take 1,000 mcg by mouth daily.    [provider]  cycloSPORINE (RESTASIS) 0.05 % ophthalmic emulsion Place 1 drop into both eyes 2 (two) times daily. 01/04/20  Angiulli, Lavon Paganini, PA-C  dorzolamide (TRUSOPT) 2 % ophthalmic solution Place 1 drop into both eyes daily. 06/25/19   [provider]  ferrous sulfate 325 (65 FE) MG tablet Take 325 mg by mouth daily with breakfast.    [provider]  FLUoxetine (PROZAC) 40 MG capsule Take 1 capsule (40 mg total) by mouth daily. 01/04/20   Angiulli, Lavon Paganini, PA-C  furosemide (LASIX) 80 MG tablet Take 80 mg by mouth 2 (two) times daily.  09/17/20   [provider]  gabapentin (NEURONTIN) 100 MG capsule Take 200 mg by mouth 3 (three) times daily.    [provider]  ipratropium-albuterol (DUONEB) 0.5-2.5 (3) MG/3ML SOLN Take 3 mLs by nebulization every 6 (six) hours as needed (sob/wheezing).  08/24/20   [provider]  isosorbide mononitrate (IMDUR) 60 MG 24 hr tablet Take 1 tablet (60 mg total) by mouth daily. 05/23/20   Elgergawy, Silver Huguenin, MD  levalbuterol Penne Lash) 0.63 MG/3ML nebulizer solution Take 0.63 mg by  nebulization every 6 (six) hours as needed for wheezing or shortness of breath. Inhale 0.63mg  once daily.  May take 0.63mg  every six hours as needed COPD.    [provider]  magnesium oxide (MAG-OX) 400 MG tablet Take 1 tablet (400 mg total) by mouth 2 (two) times daily. 01/04/20   Angiulli, Lavon Paganini, PA-C  mycophenolate (MYFORTIC) 180 MG EC tablet Take 180 mg by mouth 2 (two) times daily.    [provider]  oxyCODONE (ROXICODONE) 5 MG immediate release tablet Take 1 tablet (5 mg total) by mouth every 6 (six) hours as needed for up to 15 doses for breakthrough pain. 09/02/20   Curatolo, Adam, DO  pantoprazole (PROTONIX) 40 MG tablet Take 1 tablet (40 mg total) by mouth 2 (two) times daily. 01/04/20   Angiulli, Lavon Paganini, PA-C  predniSONE (DELTASONE) 5 MG tablet Take 7.5 mg by mouth daily with breakfast.    [provider]  PROGRAF 1 MG capsule Take 1-2 mg by mouth daily. 2mg  in the morning 1mg  in the afternoon 03/22/19   [provider]  promethazine (PHENERGAN) 25 MG tablet Take 1 tablet (25 mg total) by mouth every 8 (eight) hours as needed for up to 15 doses for nausea or vomiting. 09/02/20   Curatolo, Adam, DO  TRAVATAN Z 0.004 % SOLN ophthalmic solution Place 1 drop into both eyes at bedtime. 07/03/16   [provider]  valACYclovir (VALTREX) 500 MG tablet Take 500 mg by mouth 2 (two) times daily.    [provider]  verapamil (CALAN-SR) 240 MG CR tablet Take 240 mg by mouth daily.    [provider]    Allergies    Ardyth Harps [iron dextran], Pentamidine, Erythromycin [erythromycin], Iohexol, Oxycodone, Erythromycin, and Ultram [tramadol hcl]  Review of Systems   Review of Systems  Constitutional: Positive for fever.  HENT: Negative for ear pain.   Eyes: Negative for pain.  Respiratory: Negative for cough.   Cardiovascular: Negative for chest pain.  Gastrointestinal: Negative for abdominal pain.  Genitourinary: Negative for flank  pain.  Musculoskeletal: Negative for back pain.  Skin: Negative for rash.  Neurological: Negative for headaches.    Physical Exam Updated Vital Signs BP (!) 157/81   Pulse 99   Temp 97.8 F (36.6 C) (Rectal)   Resp (!) 27   Ht 5\' 1"  (1.549 m)   Wt 68.9 kg   SpO2 100%   BMI 28.70 kg/m   Physical Exam Constitutional:  General: She is not in acute distress.    Appearance: Normal appearance.     Comments: Somewhat somnolent, easily rousable.  HENT:     Head: Normocephalic.     Nose: Nose normal.  Eyes:     Extraocular Movements: Extraocular movements intact.  Cardiovascular:     Rate and Rhythm: Normal rate.  Pulmonary:     Comments: Moderate increased WOB; diminished breath sounds bilaterally. Abdominal:     Palpations: Abdomen is soft.     Tenderness: There is no abdominal tenderness.  Musculoskeletal:        General: Normal range of motion.     Cervical back: Normal range of motion.  Skin:    General: Skin is warm.  Neurological:     General: No focal deficit present.     Mental Status: She is alert and oriented to person, place, and time. Mental status is at baseline.     ED Results / Procedures / Treatments   Labs (all labs ordered are listed, but only abnormal results are displayed) Labs Reviewed  CBC WITH DIFFERENTIAL/PLATELET - Abnormal; Notable for the following components:      Result Value   WBC 12.4 (*)    RBC 3.07 (*)    Hemoglobin 9.6 (*)    HCT 32.1 (*)    MCV 104.6 (*)    MCHC 29.9 (*)    RDW 19.8 (*)    Neutro Abs 10.8 (*)    All other components within normal limits  COMPREHENSIVE METABOLIC PANEL - Abnormal; Notable for the following components:   Sodium 134 (*)    Chloride 97 (*)    Glucose, Bld 121 (*)    BUN 56 (*)    Creatinine, Ser 1.17 (*)    Albumin 3.0 (*)    Total Bilirubin 1.5 (*)    GFR, Estimated 52 (*)    All other components within normal limits  RESP PANEL BY RT-PCR (FLU A&B, COVID) ARPGX2  CULTURE, BLOOD  (ROUTINE X 2)  CULTURE, BLOOD (ROUTINE X 2)  LACTIC ACID, PLASMA  LIPASE, BLOOD  BRAIN NATRIURETIC PEPTIDE  URINALYSIS, ROUTINE W REFLEX MICROSCOPIC  TROPONIN I (HIGH SENSITIVITY)    EKG None  Radiology DG Chest 1 View  Result Date: 10/23/2020 CLINICAL DATA:  Shortness of breath, fever. EXAM: CHEST  1 VIEW COMPARISON:  October 06, 2020. FINDINGS: Stable cardiomediastinal silhouette. Hypoinflation of the lungs is noted with minimal bibasilar subsegmental atelectasis. No pneumothorax or pleural effusion is noted. Bony thorax is unremarkable. IMPRESSION: Hypoinflation of the lungs with minimal bibasilar subsegmental atelectasis. Electronically Signed   By: Marijo Conception M.D.   On: 10/23/2020 12:43    Procedures Procedures (including critical care time)  Medications Ordered in ED Medications  acetaminophen (TYLENOL) tablet 650 mg (650 mg Oral Not Given 10/23/20 1515)  piperacillin-tazobactam (ZOSYN) IVPB 3.375 g (3.375 g Intravenous New Bag/Given 10/23/20 1536)  ondansetron (ZOFRAN) injection 4 mg (has no administration in time range)  methylPREDNISolone sodium succinate (SOLU-MEDROL) 125 mg/2 mL injection 60 mg (60 mg Intravenous Given 10/23/20 1337)  sodium chloride 0.9 % bolus 1,000 mL (1,000 mLs Intravenous New Bag/Given 10/23/20 1535)    ED Course  I have reviewed the triage vital signs and the nursing notes.  Pertinent labs & imaging results that were available during my care of the patient were reviewed by me and considered in my medical decision making (see chart for details).  Clinical Course as of Oct 23 1540  Wed Oct 23, 2020  1447 Troponin I (High Sensitivity): 7 [JH]    Clinical Course User Index [JH] Luna Fuse, MD   MDM Rules/Calculators/A&P                          Lactic acid levels are normal.  Chest xray shows no clear infiltrate.  Awaiting urine from patient.  Given report of fever, will be brought in for hospital evaluation.  Started on empiric  abx.   Final Clinical Impression(s) / ED Diagnoses Final diagnoses:  Fever, unspecified fever cause    Rx / DC Orders ED Discharge Orders    None       Luna Fuse, MD 10/23/20 1542

## 2020-10-23 NOTE — Progress Notes (Signed)
Pharmacy Antibiotic Note  April [name] is a 65 y.o. immunocompromised female admitted on 10/23/2020 with fever.  Pharmacy has been consulted for Zosyn dosing.  Plan: Zosyn 3.375g IV q8h (each dose infused over 4 hours) Monitor renal function, cultures, clinical course   Height: 5\' 1"  (154.9 cm) Weight: 68.9 kg (151 lb 14.4 oz) IBW/kg (Calculated) : 47.8  Temp (24hrs), Avg:98 F (36.7 C), Min:97.8 F (36.6 C), Max:98.2 F (36.8 C)  Recent Labs  Lab 10/23/20 1233 10/23/20 1329  WBC  --  12.4*  CREATININE  --  1.17*  LATICACIDVEN 1.0  --     Estimated Creatinine Clearance: 43.7 mL/min (A) (by C-G formula based on SCr of 1.17 mg/dL (H)).    Allergies  Allergen Reactions  . Infed [Iron Dextran] Other (See Comments)    Chest tightness  . Pentamidine Itching, Shortness Of Breath and Swelling  . Erythromycin [Erythromycin] Other (See Comments)    Mouth Ulcers  . Iohexol Other (See Comments)    Per patient "she has had a kidney transplant and should never have contrast"  . Oxycodone Nausea Only and Nausea And Vomiting  . Erythromycin Rash    Causes breakout in mouth  . Ultram [Tramadol Hcl] Anxiety    Antimicrobials this admission: 12/1 Zosyn >>  Dose adjustments this admission: --  Microbiology results: 12/1 BCx: sent 12/1 Respiratory panel: negative for COVID-19, influenza A/B  Thank you for allowing pharmacy to be a part of this patient's care.   Lindell Spar, PharmD, BCPS Clinical Pharmacist  10/23/2020 6:22 PM

## 2020-10-23 NOTE — Progress Notes (Signed)
Called to get report on patient. Was unable to speak with someone from the unit. Will wait for RN to call report.

## 2020-10-23 NOTE — H&P (Signed)
History and Physical    April [name] HAL:[id number] DOB: 09-26-1956 DOA: 10/23/2020  PCP: [name], MD  Patient coming from: Home  Chief Complaint: dyspnea, N, fever.  HPI: April [name] is a 64 y.o. female with medical history significant of polycystic kidney disease, renal transplant, CHF, asthma. Presenting with fever, nausea, and dyspnea. Her symptoms started yesterday morning with fatigue that was severe enough that she could not get out of bed. She then noted nausea through out the day with some periods of dry heaving. She had no appetite and was not around anyone with similar symptoms. She started having shortness of breath last night and a fever of 100.4. She tried her home inhalers, nebulizers, and APAP; but nothing provided lasting relief. She talked to her PCP this morning and it was recommended that she come to the ED. She reports no alleviating or aggravating factors.   ED Course: No fevers noted in the ED. She had a slightly elevated BNP from the previous one last month. She had a slightly elevated WBC. Bld Cx were taken and UA performed. She was started on zosyn. TRH was called for admission.   Review of Systems:  Denies CP, palpitations, syncopal episodes, diarrhea, constipation, sick contacts, medication changes, hematemesis, hematochezia. Review of systems is otherwise negative for all not mentioned in HPI.   PMHx Past Medical History:  Diagnosis Date  . Arthritis   . Asthma   . CHF (congestive heart failure) ([place])   . COPD (chronic obstructive pulmonary disease) ([place])   . Depression   . Essential hypertension 02/08/2020  . GERD (gastroesophageal reflux disease)   . Hyperlipidemia   . Hyperparathyroidism   . Polycystic kidney   . PONV (postoperative nausea and vomiting)     PSHx Past Surgical History:  Procedure Laterality Date  . 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 BILATERAL OOPHORECTOMY    . BIOPSY  05/19/2020    Procedure: BIOPSY;  Surgeon: Carol Ada, MD;  Location: [facility] ENDOSCOPY;  Service: Endoscopy;;  . CARPAL TUNNEL RELEASE  2000  . CESAREAN SECTION    . COLONOSCOPY WITH PROPOFOL N/A 05/19/2020   Procedure: COLONOSCOPY WITH PROPOFOL;  Surgeon: Carol Ada, MD;  Location: [place];  Service: Endoscopy;  Laterality: N/A;  . ENTEROSCOPY N/A 05/19/2020   Procedure: ENTEROSCOPY;  Surgeon: Carol Ada, MD;  Location: [place];  Service: Endoscopy;  Laterality: N/A;  . INSERTION OF DIALYSIS CATHETER  03/31/2012   Procedure: INSERTION OF DIALYSIS CATHETER;  Surgeon: Angelia Mould, MD;  Location: [place];  Service: Vascular;  Laterality: N/A;  insertion of dialysis catheter right internal jugular vein  . 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 ORIF TIBIA FRACTURE Left 12/23/2018   Procedure: OPEN REDUCTION INTERNAL FIXATION (ORIF) TIBIA FRACTURE;  Surgeon: Shona Needles, MD;  Location: [place];  Service: Orthopedics;  Laterality: Left;  . ORIF TIBIA PLATEAU Right 12/23/2018   Procedure: OPEN REDUCTION INTERNAL FIXATION (ORIF) TIBIAL PLATEAU;  Surgeon: Shona Needles, MD;  Location: [place];  Service: Orthopedics;  Laterality: Right;  . ORIF WRIST FRACTURE Left 12/08/2019   Procedure: OPEN REDUCTION INTERNAL FIXATION (ORIF) WRIST FRACTURE, REPAIR LACERATION LEFT FOREARM;  Surgeon: Dorna Leitz, MD;  Location: [facility];  Service: Orthopedics;  Laterality: Left;  . TONSILLECTOMY  1967  . TOTAL HIP ARTHROPLASTY Left 12/08/2019   Procedure: TOTAL HIP ARTHROPLASTY ANTERIOR APPROACH;  Surgeon: Dorna Leitz, MD;  Location: [facility];  Service: Orthopedics;  Laterality: Left;  . TUBAL LIGATION  2010    SocHx  reports that she quit smoking about 22 years ago. Her smoking use included cigarettes. She has a 3.75 pack-year smoking history. She has never used smokeless tobacco. She reports that she does not drink alcohol and does not use drugs.  Allergies  Allergen Reactions  . Infed [Iron Dextran] Other (See  Comments)    Chest tightness  . Pentamidine Itching, Shortness Of Breath and Swelling  . Erythromycin [Erythromycin] Other (See Comments)    Mouth Ulcers  . Iohexol Other (See Comments)    Per patient "she has had a kidney transplant and should never have contrast"  . Oxycodone Nausea Only and Nausea And Vomiting  . Erythromycin Rash    Causes breakout in mouth  . Ultram [Tramadol Hcl] Anxiety    FamHx Family History  Problem Relation Age of Onset  . Hypertension Mother   . Heart disease Father        CABG history  . Polycystic kidney disease Father   . Polycystic kidney disease Brother     Prior to Admission medications   Medication Sig Start Date End Date Taking? Authorizing Provider  acetaminophen (TYLENOL) 325 MG tablet Take 2 tablets (650 mg total) by mouth every 6 (six) hours as needed for mild pain (or Fever >/= 101). 05/22/20   Elgergawy, Silver Huguenin, MD  albuterol (VENTOLIN HFA) 108 (90 Base) MCG/ACT inhaler Inhale 2 puffs into the lungs every 4 (four) hours as needed for wheezing or shortness of breath. 01/04/20   Angiulli, Lavon Paganini, PA-C  allopurinol (ZYLOPRIM) 100 MG tablet Take 1 tablet (100 mg total) by mouth daily. 09/12/20 10/12/20  Elodia Florence., MD  ascorbic acid (VITAMIN C) 500 MG tablet Take 500 mg by mouth daily.    [provider]  brimonidine (ALPHAGAN P) 0.1 % SOLN Place 1 drop into both eyes 2 (two) times daily.     [provider]  Budeson-Glycopyrrol-Formoterol (BREZTRI AEROSPHERE) 160-9-4.8 MCG/ACT AERO Inhale 2 puffs into the lungs 2 (two) times daily. 08/20/20   Martyn Ehrich, NP  cholecalciferol (VITAMIN D3) 25 MCG (1000 UNIT) tablet Take 1 tablet (1,000 Units total) by mouth daily. 01/04/20   Angiulli, Lavon Paganini, PA-C  cyanocobalamin 1000 MCG tablet Take 1,000 mcg by mouth daily.    [provider]  cycloSPORINE (RESTASIS) 0.05 % ophthalmic emulsion Place 1 drop into both eyes 2 (two) times daily. 01/04/20   Angiulli,  Lavon Paganini, PA-C  dorzolamide (TRUSOPT) 2 % ophthalmic solution Place 1 drop into both eyes daily. 06/25/19   [provider]  ferrous sulfate 325 (65 FE) MG tablet Take 325 mg by mouth daily with breakfast.    [provider]  FLUoxetine (PROZAC) 40 MG capsule Take 1 capsule (40 mg total) by mouth daily. 01/04/20   Angiulli, Lavon Paganini, PA-C  furosemide (LASIX) 80 MG tablet Take 80 mg by mouth 2 (two) times daily.  09/17/20   [provider]  gabapentin (NEURONTIN) 100 MG capsule Take 200 mg by mouth 3 (three) times daily.    [provider]  ipratropium-albuterol (DUONEB) 0.5-2.5 (3) MG/3ML SOLN Take 3 mLs by nebulization every 6 (six) hours as needed (sob/wheezing).  08/24/20   [provider]  isosorbide mononitrate (IMDUR) 60 MG 24 hr tablet Take 1 tablet (60 mg total) by mouth daily. 05/23/20   Elgergawy, Silver Huguenin, MD  levalbuterol Penne Lash) 0.63 MG/3ML nebulizer solution Take 0.63 mg by nebulization  every 6 (six) hours as needed for wheezing or shortness of breath. Inhale 0.63mg  once daily.  May take 0.63mg  every six hours as needed COPD.    [provider]  magnesium oxide (MAG-OX) 400 MG tablet Take 1 tablet (400 mg total) by mouth 2 (two) times daily. 01/04/20   Angiulli, Lavon Paganini, PA-C  mycophenolate (MYFORTIC) 180 MG EC tablet Take 180 mg by mouth 2 (two) times daily.    [provider]  oxyCODONE (ROXICODONE) 5 MG immediate release tablet Take 1 tablet (5 mg total) by mouth every 6 (six) hours as needed for up to 15 doses for breakthrough pain. 09/02/20   Curatolo, Adam, DO  pantoprazole (PROTONIX) 40 MG tablet Take 1 tablet (40 mg total) by mouth 2 (two) times daily. 01/04/20   Angiulli, Lavon Paganini, PA-C  predniSONE (DELTASONE) 5 MG tablet Take 7.5 mg by mouth daily with breakfast.    [provider]  PROGRAF 1 MG capsule Take 1-2 mg by mouth daily. 2mg  in the morning 1mg  in the afternoon 03/22/19   [provider]   promethazine (PHENERGAN) 25 MG tablet Take 1 tablet (25 mg total) by mouth every 8 (eight) hours as needed for up to 15 doses for nausea or vomiting. 09/02/20   Curatolo, Adam, DO  TRAVATAN Z 0.004 % SOLN ophthalmic solution Place 1 drop into both eyes at bedtime. 07/03/16   [provider]  valACYclovir (VALTREX) 500 MG tablet Take 500 mg by mouth 2 (two) times daily.    [provider]  verapamil (CALAN-SR) 240 MG CR tablet Take 240 mg by mouth daily.    [provider]    Physical Exam: Vitals:   10/23/20 1500 10/23/20 1515 10/23/20 1530 10/23/20 1546  BP: (!) 159/73 (!) 157/81 (!) 158/81 (!) 151/79  Pulse: (!) 101 99 100 96  Resp: (!) 25 (!) 27 (!) 28 (!) 26  Temp:      TempSrc:      SpO2: 100% 100% 100% 100%  Weight:      Height:        General: 64 y.o. female resting in bed in NAD Eyes: PERRL, normal sclera ENMT: Nares patent w/o discharge, orophaynx clear, dentition normal, ears w/o discharge/lesions/ulcers Neck: Supple, trachea midline Cardiovascular: tachy, +S1, S2, no m/g/r, equal pulses throughout Respiratory: CTABL, no w/r/r, normal WOB GI: BS+, ND, epigastric TTP mildly, no masses noted, no organomegaly noted MSK: No e/c/c Skin: No rashes, bruises, ulcerations noted Neuro: A&O x 3, no focal deficits Psyc: Appropriate interaction and affect, calm/cooperative  Labs on Admission: I have personally reviewed following labs and imaging studies  CBC: Recent Labs  Lab 10/23/20 1329  WBC 12.4*  NEUTROABS 10.8*  HGB 9.6*  HCT 32.1*  MCV 104.6*  PLT 440   Basic Metabolic Panel: Recent Labs  Lab 10/23/20 1329  NA 134*  K 3.8  CL 97*  CO2 26  GLUCOSE 121*  BUN 56*  CREATININE 1.17*  CALCIUM 9.6   GFR: Estimated Creatinine Clearance: 43.7 mL/min (A) (by C-G formula based on SCr of 1.17 mg/dL (H)). Liver Function Tests: Recent Labs  Lab 10/23/20 1329  AST 15  ALT 15  ALKPHOS 76  BILITOT 1.5*  PROT 7.4  ALBUMIN 3.0*    Recent Labs  Lab 10/23/20 1329  LIPASE 23   No results for input(s): AMMONIA in the last 168 hours. Coagulation Profile: No results for input(s): INR, PROTIME in the last 168 hours. Cardiac Enzymes: No results for  input(s): CKTOTAL, CKMB, CKMBINDEX, TROPONINI in the last 168 hours. BNP (last 3 results) No results for input(s): PROBNP in the last 8760 hours. HbA1C: No results for input(s): HGBA1C in the last 72 hours. CBG: No results for input(s): GLUCAP in the last 168 hours. Lipid Profile: No results for input(s): CHOL, HDL, LDLCALC, TRIG, CHOLHDL, LDLDIRECT in the last 72 hours. Thyroid Function Tests: No results for input(s): TSH, T4TOTAL, FREET4, T3FREE, THYROIDAB in the last 72 hours. Anemia Panel: No results for input(s): VITAMINB12, FOLATE, FERRITIN, TIBC, IRON, RETICCTPCT in the last 72 hours. Urine analysis:    Component Value Date/Time   COLORURINE YELLOW 09/08/2020 1711   APPEARANCEUR CLEAR 09/08/2020 1711   LABSPEC 1.011 09/08/2020 1711   PHURINE 5.0 09/08/2020 1711   GLUCOSEU NEGATIVE 09/08/2020 1711   HGBUR SMALL (A) 09/08/2020 1711   BILIRUBINUR NEGATIVE 09/08/2020 1711   KETONESUR NEGATIVE 09/08/2020 1711   PROTEINUR NEGATIVE 09/08/2020 1711   UROBILINOGEN 0.2 05/15/2013 0733   NITRITE NEGATIVE 09/08/2020 1711   LEUKOCYTESUR NEGATIVE 09/08/2020 1711    Radiological Exams on Admission: DG Chest 1 View  Result Date: 10/23/2020 CLINICAL DATA:  Shortness of breath, fever. EXAM: CHEST  1 VIEW COMPARISON:  October 06, 2020. FINDINGS: Stable cardiomediastinal silhouette. Hypoinflation of the lungs is noted with minimal bibasilar subsegmental atelectasis. No pneumothorax or pleural effusion is noted. Bony thorax is unremarkable. IMPRESSION: Hypoinflation of the lungs with minimal bibasilar subsegmental atelectasis. Electronically Signed   By: Marijo Conception M.D.   On: 10/23/2020 12:43    EKG: Independently reviewed. Sinus tach, no st  elevation  Assessment/Plan N/V Epigastric abdominal pain     - admit to obs, med-surg     - phenergan; NPO for tonight     - check KUB     - given zosyn in the ED and bld cx sent. She is immune compromised. Can continue abx for now     - given fluid bolus in ED; given her history of HFpEF, will hold further fluids for now     - protonix, GI cocktail  HFpEF     - hold lasix tonight; resume in AM dependent on fluid status  Dyspnea Hx of Asthma     - will get nebs     - uses O2 up to 2L at home; currently on 1.5L     - with her fever at home and a clear CXR, will also look at a d-dimer; if elevated, check VQ study  Hx of renal transplant.     - continue home medications  Anxiety     - continue home meds  DVT prophylaxis: lovenox  Code Status: FULL  Family Communication: None at bedside.  Consults called: None  Status is: Observation  The patient remains OBS appropriate and will d/c before 2 midnights.  Dispo: The patient is from: Home              Anticipated d/c is to: Home              Anticipated d/c date is: 1 day              Patient currently is not medically stable to d/c.  Jonnie Finner DO [facility]  If 7PM-7AM, please contact night-coverage [email or web address]  10/23/2020, 3:58 PM

## 2020-10-23 NOTE — ED Notes (Signed)
Called report to [place], Therapist, sports

## 2020-10-23 NOTE — ED Notes (Signed)
Rectal temp was 97.8

## 2020-10-23 NOTE — ED Provider Notes (Signed)
[facility] DEPT Provider Note   CSN: [id number] Arrival date & time: 10/23/20  1152     History No chief complaint on file.   April [name] is a 64 y.o. female.  Patient presents from home with concern for fevers, generalized weakness.  Patient has extensive medical history including CHF, COPD, kidney transplant.  She was noted to be feeling weak and more tired than usual yesterday by her caregiver.  Positive sob and cough.  Patient states she had a fever today 100.4 at home.  No chest pain, no reports of vomiting or diarrhea.  She is also complaining of "painful nausea" sensation at epigastric region.          Past Medical History:  Diagnosis Date  . Arthritis   . Asthma   . CHF (congestive heart failure) ([place])   . COPD (chronic obstructive pulmonary disease) ([place])   . Depression   . Essential hypertension 02/08/2020  . GERD (gastroesophageal reflux disease)   . Hyperlipidemia   . Hyperparathyroidism   . Polycystic kidney   . PONV (postoperative nausea and vomiting)     Patient Active Problem List   Diagnosis Date Noted  . Lower GI bleeding 09/25/2020  . Diarrhea 09/10/2020  . Acute renal failure superimposed on stage 3b chronic kidney disease ([place]) 09/08/2020  . Diverticulitis 09/04/2020  . SIRS (systemic inflammatory response syndrome) ([place]) 07/02/2020  . Chronic kidney disease, stage 3a ([place])   . 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 History of immunosuppressive therapy 05/27/2020  . FUO (fever of unknown origin) 05/15/2020  . Immunocompromised state ([place])   . Decubitus ulcer of coccyx, unstageable ([facility]) 05/01/2020  . Pericardial effusion 04/16/2020  . Chronic respiratory failure with hypoxia ([place]) 04/16/2020  . Proteinuria 04/16/2020  . Pressure injury of skin 04/08/2020  . COPD (chronic obstructive pulmonary disease) ([place]) 04/07/2020  . SVT (supraventricular tachycardia) ([place])   . Anemia of chronic disease   .  Palliative care encounter   . Goals of care, counseling/discussion   . Essential hypertension 02/08/2020  . Glaucoma 02/08/2020  . Sepsis with encephalopathy without septic shock ([place]) 01/31/2020  . Fracture of left superior pubic ramus ([facility])   . Pain   . Anxiety state   . Left displaced femoral neck fracture ([place]) 12/15/2019  . Status post total hip replacement, left   . Post-op pain   . Polycystic kidney   . Closed left hip fracture ([place]) 12/07/2019  . Left wrist fracture 12/07/2019  . Medication management 09/22/2019  . Physical deconditioning 09/22/2019  . Acute respiratory failure with hypoxia ([place]) 04/19/2019  . Acute on chronic diastolic CHF (congestive heart failure) ([place]) 04/19/2019  . Bilateral closed proximal tibial fracture 12/21/2018  . Asthma, chronic, unspecified asthma severity, with acute exacerbation 10/03/2018  . Chronic diastolic CHF (congestive heart failure) ([place]) 10/01/2017  . Asthma, mild intermittent 07/15/2016  . GERD without esophagitis 07/15/2016  . Renal transplant recipient 07/15/2016  . Hyperlipidemia 07/15/2016  . Depression 07/15/2016  . Bronchitis, mucopurulent recurrent ([place]) 08/08/2014  . Chronic cough 07/24/2014  . End stage renal disease ([place]) 03/23/2012  . 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 BILATERAL OOPHORECTOMY    . BIOPSY  05/19/2020   Procedure: BIOPSY;  Surgeon: Carol Ada, MD;  Location: [facility];  Service: Endoscopy;;  .  CARPAL TUNNEL RELEASE  2000  . CESAREAN SECTION    . COLONOSCOPY WITH PROPOFOL N/A 05/19/2020   Procedure: COLONOSCOPY WITH PROPOFOL;  Surgeon: Carol Ada, MD;  Location: [facility];  Service: Endoscopy;  Laterality: N/A;  . ENTEROSCOPY N/A 05/19/2020   Procedure: ENTEROSCOPY;  Surgeon: Carol Ada, MD;  Location: [facility];  Service:  Endoscopy;  Laterality: N/A;  . INSERTION OF DIALYSIS CATHETER  03/31/2012   Procedure: INSERTION OF DIALYSIS CATHETER;  Surgeon: Angelia Mould, MD;  Location: [facility];  Service: Vascular;  Laterality: N/A;  insertion of dialysis catheter right internal jugular vein  . 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 ORIF TIBIA FRACTURE Left 12/23/2018   Procedure: OPEN REDUCTION INTERNAL FIXATION (ORIF) TIBIA FRACTURE;  Surgeon: Shona Needles, MD;  Location: [facility];  Service: Orthopedics;  Laterality: Left;  . ORIF TIBIA PLATEAU Right 12/23/2018   Procedure: OPEN REDUCTION INTERNAL FIXATION (ORIF) TIBIAL PLATEAU;  Surgeon: Shona Needles, MD;  Location: [facility];  Service: Orthopedics;  Laterality: Right;  . ORIF WRIST FRACTURE Left 12/08/2019   Procedure: OPEN REDUCTION INTERNAL FIXATION (ORIF) WRIST FRACTURE, REPAIR LACERATION LEFT FOREARM;  Surgeon: Dorna Leitz, MD;  Location: [facility];  Service: Orthopedics;  Laterality: Left;  . TONSILLECTOMY  1967  . TOTAL HIP ARTHROPLASTY Left 12/08/2019   Procedure: TOTAL HIP ARTHROPLASTY ANTERIOR APPROACH;  Surgeon: Dorna Leitz, MD;  Location: [facility];  Service: Orthopedics;  Laterality: Left;  . TUBAL LIGATION  2010     OB History   No obstetric history on file.     Family History  Problem Relation Age of Onset  . Hypertension Mother   . Heart disease Father        CABG history  . Polycystic kidney disease Father   . Polycystic kidney disease Brother     Social History   Tobacco Use  . Smoking status: Former Smoker    Packs/day: 0.25    Years: 15.00    Pack years: 3.75    Types: Cigarettes    Quit date: 03/22/1998    Years since quitting: 22.6  . Smokeless tobacco: Never Used  Vaping Use  . Vaping Use: Never used  Substance Use Topics  . Alcohol use: No  . Drug use: No    Home Medications Prior to Admission medications   Medication Sig Start Date End Date Taking? Authorizing Provider  acetaminophen (TYLENOL) 325 MG tablet Take 2 tablets  (650 mg total) by mouth every 6 (six) hours as needed for mild pain (or Fever >/= 101). 05/22/20   Elgergawy, Silver Huguenin, MD  albuterol (VENTOLIN HFA) 108 (90 Base) MCG/ACT inhaler Inhale 2 puffs into the lungs every 4 (four) hours as needed for wheezing or shortness of breath. 01/04/20   Angiulli, Lavon Paganini, PA-C  allopurinol (ZYLOPRIM) 100 MG tablet Take 1 tablet (100 mg total) by mouth daily. 09/12/20 10/12/20  Elodia Florence., MD  ascorbic acid (VITAMIN C) 500 MG tablet Take 500 mg by mouth daily.    [provider]  brimonidine (ALPHAGAN P) 0.1 % SOLN Place 1 drop into both eyes 2 (two) times daily.     [provider]  Budeson-Glycopyrrol-Formoterol (BREZTRI AEROSPHERE) 160-9-4.8 MCG/ACT AERO Inhale 2 puffs into the lungs 2 (two) times daily. 08/20/20   Martyn Ehrich, NP  cholecalciferol (VITAMIN D3) 25 MCG (1000 UNIT) tablet Take 1 tablet (1,000 Units total) by mouth daily. 01/04/20   Angiulli, Lavon Paganini, PA-C  cyanocobalamin 1000  MCG tablet Take 1,000 mcg by mouth daily.    [provider]  cycloSPORINE (RESTASIS) 0.05 % ophthalmic emulsion Place 1 drop into both eyes 2 (two) times daily. 01/04/20   Angiulli, Lavon Paganini, PA-C  dorzolamide (TRUSOPT) 2 % ophthalmic solution Place 1 drop into both eyes daily. 06/25/19   [provider]  ferrous sulfate 325 (65 FE) MG tablet Take 325 mg by mouth daily with breakfast.    [provider]  FLUoxetine (PROZAC) 40 MG capsule Take 1 capsule (40 mg total) by mouth daily. 01/04/20   Angiulli, Lavon Paganini, PA-C  furosemide (LASIX) 80 MG tablet Take 80 mg by mouth 2 (two) times daily.  09/17/20   [provider]  gabapentin (NEURONTIN) 100 MG capsule Take 200 mg by mouth 3 (three) times daily.    [provider]  ipratropium-albuterol (DUONEB) 0.5-2.5 (3) MG/3ML SOLN Take 3 mLs by nebulization every 6 (six) hours as needed (sob/wheezing).  08/24/20   [provider]  isosorbide mononitrate  (IMDUR) 60 MG 24 hr tablet Take 1 tablet (60 mg total) by mouth daily. 05/23/20   Elgergawy, Silver Huguenin, MD  levalbuterol Penne Lash) 0.63 MG/3ML nebulizer solution Take 0.63 mg by nebulization every 6 (six) hours as needed for wheezing or shortness of breath. Inhale 0.63mg  once daily.  May take 0.63mg  every six hours as needed COPD.    [provider]  magnesium oxide (MAG-OX) 400 MG tablet Take 1 tablet (400 mg total) by mouth 2 (two) times daily. 01/04/20   Angiulli, Lavon Paganini, PA-C  mycophenolate (MYFORTIC) 180 MG EC tablet Take 180 mg by mouth 2 (two) times daily.    [provider]  oxyCODONE (ROXICODONE) 5 MG immediate release tablet Take 1 tablet (5 mg total) by mouth every 6 (six) hours as needed for up to 15 doses for breakthrough pain. 09/02/20   Curatolo, Adam, DO  pantoprazole (PROTONIX) 40 MG tablet Take 1 tablet (40 mg total) by mouth 2 (two) times daily. 01/04/20   Angiulli, Lavon Paganini, PA-C  predniSONE (DELTASONE) 5 MG tablet Take 7.5 mg by mouth daily with breakfast.    [provider]  PROGRAF 1 MG capsule Take 1-2 mg by mouth daily. 2mg  in the morning 1mg  in the afternoon 03/22/19   [provider]  promethazine (PHENERGAN) 25 MG tablet Take 1 tablet (25 mg total) by mouth every 8 (eight) hours as needed for up to 15 doses for nausea or vomiting. 09/02/20   Curatolo, Adam, DO  TRAVATAN Z 0.004 % SOLN ophthalmic solution Place 1 drop into both eyes at bedtime. 07/03/16   [provider]  valACYclovir (VALTREX) 500 MG tablet Take 500 mg by mouth 2 (two) times daily.    [provider]  verapamil (CALAN-SR) 240 MG CR tablet Take 240 mg by mouth daily.    [provider]    Allergies    Ardyth Harps [iron dextran], Pentamidine, Erythromycin [erythromycin], Iohexol, Oxycodone, Erythromycin, and Ultram [tramadol hcl]  Review of Systems   Review of Systems  Constitutional: Positive for fever.  HENT: Negative for ear pain.   Eyes: Negative  for pain.  Respiratory: Positive for cough and shortness of breath.   Cardiovascular: Negative for chest pain.  Gastrointestinal: Positive for abdominal pain.  Genitourinary: Negative for flank pain.  Musculoskeletal: Negative for back pain.  Skin: Negative for rash.  Neurological: Negative for headaches.    Physical Exam Updated Vital Signs There were no vitals taken for this visit.  Physical Exam Constitutional:      General: She is not in acute distress.    Appearance: Normal appearance.  HENT:     Head: Normocephalic.     Nose: Nose normal.  Eyes:     Extraocular Movements: Extraocular movements intact.  Cardiovascular:     Rate and Rhythm: Normal rate.  Pulmonary:     Effort: Pulmonary effort is normal.  Abdominal:     Palpations: Abdomen is soft.     Tenderness: There is no abdominal tenderness.  Musculoskeletal:        General: Normal range of motion.     Cervical back: Normal range of motion.  Skin:    General: Skin is warm.  Neurological:     General: No focal deficit present.     Mental Status: She is alert.     ED Results / Procedures / Treatments   Labs (all labs ordered are listed, but only abnormal results are displayed) Labs Reviewed  CULTURE, BLOOD (ROUTINE X 2)  CULTURE, BLOOD (ROUTINE X 2)  RESP PANEL BY RT-PCR (FLU A&B, COVID) ARPGX2  CBC WITH DIFFERENTIAL/PLATELET  COMPREHENSIVE METABOLIC PANEL  LACTIC ACID, PLASMA  LACTIC ACID, PLASMA  BRAIN NATRIURETIC PEPTIDE  LIPASE, BLOOD  URINALYSIS, ROUTINE W REFLEX MICROSCOPIC  TROPONIN I (HIGH SENSITIVITY)    EKG None  Radiology No results found.  Procedures Procedures (including critical care time)  Medications Ordered in ED Medications  acetaminophen (TYLENOL) tablet 650 mg (has no administration in time range)  methylPREDNISolone sodium succinate (SOLU-MEDROL) 125 mg/2 mL injection 60 mg (has no administration in time range)    ED Course  I have reviewed the triage vital signs  and the nursing notes.  Pertinent labs & imaging results that were available during my care of the patient were reviewed by me and considered in my medical decision making (see chart for details).  Clinical Course as of Oct 26 1700  Wed Oct 23, 2020  1447 Troponin I (High Sensitivity): 7 [JH]    Clinical Course User Index [JH] [place], Greggory Brandy, MD   MDM Rules/Calculators/A&P                           Immunocompromise state and report of fevers, will be brought to the hospitalist for further evaluation.   Final Clinical Impression(s) / ED Diagnoses Final diagnoses:  None    Rx / DC Orders ED Discharge Orders    None       Luna Fuse, MD 10/26/20 [phone]

## 2020-10-23 NOTE — ED Triage Notes (Addendum)
Pt came from home via EMS. Caretaker at home reports that pt started feeling weak yesterday, worsening since. Pt can walk with cane, but not very far. This morning, began running a fever, EMS reported 101 F. Afebrile on site at [facility]. Pt reports nausea, denies vomiting. Pt was more sob than normal this AM, pt took 2 extra puffs of albuterol inhaler. Left side of abdomen appears tender upon palpation. Pt reports epigastric pain r/t acid reflux. Caretaker reports 2 pressure sores on sacrum area, appear to be healing properly. PMH: CHF, asthma, Resp. failure, kidney transplant, SVT, HTN

## 2020-10-24 ENCOUNTER — Observation Stay (HOSPITAL_COMMUNITY): Payer: Medicare Other

## 2020-10-24 ENCOUNTER — Inpatient Hospital Stay (HOSPITAL_COMMUNITY): Payer: Medicare Other

## 2020-10-24 DIAGNOSIS — E860 Dehydration: Secondary | ICD-10-CM | POA: Diagnosis present

## 2020-10-24 DIAGNOSIS — R0600 Dyspnea, unspecified: Secondary | ICD-10-CM

## 2020-10-24 DIAGNOSIS — I5032 Chronic diastolic (congestive) heart failure: Secondary | ICD-10-CM | POA: Diagnosis present

## 2020-10-24 DIAGNOSIS — J9610 Chronic respiratory failure, unspecified whether with hypoxia or hypercapnia: Secondary | ICD-10-CM | POA: Diagnosis present

## 2020-10-24 DIAGNOSIS — R112 Nausea with vomiting, unspecified: Secondary | ICD-10-CM | POA: Diagnosis not present

## 2020-10-24 DIAGNOSIS — N1831 Chronic kidney disease, stage 3a: Secondary | ICD-10-CM | POA: Diagnosis present

## 2020-10-24 DIAGNOSIS — R109 Unspecified abdominal pain: Secondary | ICD-10-CM

## 2020-10-24 DIAGNOSIS — N2581 Secondary hyperparathyroidism of renal origin: Secondary | ICD-10-CM | POA: Diagnosis present

## 2020-10-24 DIAGNOSIS — Z96642 Presence of left artificial hip joint: Secondary | ICD-10-CM | POA: Diagnosis present

## 2020-10-24 DIAGNOSIS — K219 Gastro-esophageal reflux disease without esophagitis: Secondary | ICD-10-CM | POA: Diagnosis present

## 2020-10-24 DIAGNOSIS — Z20822 Contact with and (suspected) exposure to covid-19: Secondary | ICD-10-CM | POA: Diagnosis present

## 2020-10-24 DIAGNOSIS — M1990 Unspecified osteoarthritis, unspecified site: Secondary | ICD-10-CM | POA: Diagnosis present

## 2020-10-24 DIAGNOSIS — H409 Unspecified glaucoma: Secondary | ICD-10-CM | POA: Diagnosis present

## 2020-10-24 DIAGNOSIS — R509 Fever, unspecified: Secondary | ICD-10-CM | POA: Diagnosis present

## 2020-10-24 DIAGNOSIS — D631 Anemia in chronic kidney disease: Secondary | ICD-10-CM | POA: Diagnosis present

## 2020-10-24 DIAGNOSIS — M109 Gout, unspecified: Secondary | ICD-10-CM | POA: Diagnosis present

## 2020-10-24 DIAGNOSIS — I130 Hypertensive heart and chronic kidney disease with heart failure and stage 1 through stage 4 chronic kidney disease, or unspecified chronic kidney disease: Secondary | ICD-10-CM | POA: Diagnosis present

## 2020-10-24 DIAGNOSIS — N179 Acute kidney failure, unspecified: Secondary | ICD-10-CM | POA: Diagnosis not present

## 2020-10-24 DIAGNOSIS — R1013 Epigastric pain: Secondary | ICD-10-CM | POA: Diagnosis not present

## 2020-10-24 DIAGNOSIS — Z8249 Family history of ischemic heart disease and other diseases of the circulatory system: Secondary | ICD-10-CM | POA: Diagnosis not present

## 2020-10-24 DIAGNOSIS — R269 Unspecified abnormalities of gait and mobility: Secondary | ICD-10-CM | POA: Diagnosis present

## 2020-10-24 DIAGNOSIS — E785 Hyperlipidemia, unspecified: Secondary | ICD-10-CM | POA: Diagnosis present

## 2020-10-24 DIAGNOSIS — Z940 Kidney transplant status: Secondary | ICD-10-CM | POA: Diagnosis not present

## 2020-10-24 DIAGNOSIS — Z8271 Family history of polycystic kidney: Secondary | ICD-10-CM | POA: Diagnosis not present

## 2020-10-24 DIAGNOSIS — F419 Anxiety disorder, unspecified: Secondary | ICD-10-CM | POA: Diagnosis present

## 2020-10-24 DIAGNOSIS — E871 Hypo-osmolality and hyponatremia: Secondary | ICD-10-CM | POA: Diagnosis not present

## 2020-10-24 DIAGNOSIS — J449 Chronic obstructive pulmonary disease, unspecified: Secondary | ICD-10-CM | POA: Diagnosis present

## 2020-10-24 LAB — COMPREHENSIVE METABOLIC PANEL
ALT: 14 U/L (ref 0–44)
AST: 13 U/L — ABNORMAL LOW (ref 15–41)
Albumin: 2.7 g/dL — ABNORMAL LOW (ref 3.5–5.0)
Alkaline Phosphatase: 62 U/L (ref 38–126)
Anion gap: 13 (ref 5–15)
BUN: 60 mg/dL — ABNORMAL HIGH (ref 8–23)
CO2: 22 mmol/L (ref 22–32)
Calcium: 9.3 mg/dL (ref 8.9–10.3)
Chloride: 102 mmol/L (ref 98–111)
Creatinine, Ser: 1.35 mg/dL — ABNORMAL HIGH (ref 0.44–1.00)
GFR, Estimated: 44 mL/min — ABNORMAL LOW (ref >60)
Glucose, Bld: 166 mg/dL — ABNORMAL HIGH (ref 70–99)
Potassium: 5 mmol/L (ref 3.5–5.1)
Sodium: 137 mmol/L (ref 135–145)
Total Bilirubin: 1.1 mg/dL (ref 0.3–1.2)
Total Protein: 6.5 g/dL (ref 6.5–8.1)

## 2020-10-24 LAB — CBC
HCT: 30.4 % — ABNORMAL LOW (ref 36.0–46.0)
Hemoglobin: 9.1 g/dL — ABNORMAL LOW (ref 12.0–15.0)
MCH: 31.4 pg (ref 26.0–34.0)
MCHC: 29.9 g/dL — ABNORMAL LOW (ref 30.0–36.0)
MCV: 104.8 fL — ABNORMAL HIGH (ref 80.0–100.0)
Platelets: 140 10*3/uL — ABNORMAL LOW (ref 150–400)
RBC: 2.9 MIL/uL — ABNORMAL LOW (ref 3.87–5.11)
RDW: 19.1 % — ABNORMAL HIGH (ref 11.5–15.5)
WBC: 7 10*3/uL (ref 4.0–10.5)
nRBC: 0 % (ref 0.0–0.2)

## 2020-10-24 MED ORDER — IOHEXOL 9 MG/ML PO SOLN
500.0000 mL | ORAL | Status: AC
  Administered 2020-10-24 (×2): 500 mL via ORAL

## 2020-10-24 MED ORDER — TECHNETIUM TO 99M ALBUMIN AGGREGATED
4.4000 | Freq: Once | INTRAVENOUS | Status: AC | PRN
  Administered 2020-10-24: 4.4 via INTRAVENOUS

## 2020-10-24 MED ORDER — IOHEXOL 9 MG/ML PO SOLN
ORAL | Status: AC
  Filled 2020-10-24: qty 1000

## 2020-10-24 NOTE — Care Management Obs Status (Signed)
[facility] NOTIFICATION   Patient Details  Name: April [name] MRN: [id number] Date of Birth: 1956-10-25   Medicare Observation Status Notification Given:  Yes    Lynnell Catalan, RN 10/24/2020, 1:26 PM

## 2020-10-24 NOTE — Progress Notes (Signed)
PROGRESS NOTE    April [name]  QQI:[id number] DOB: 09/30/1956 DOA: 10/23/2020 PCP: [name], MD   Brief Narrative:  HPI per Dr. Cherylann Ratel on 10/23/20 April [name] is a 64 y.o. female with medical history significant of polycystic kidney disease, renal transplant, CHF, asthma. Presenting with fever, nausea, and dyspnea. Her symptoms started yesterday morning with fatigue that was severe enough that she could not get out of bed. She then noted nausea through out the day with some periods of dry heaving. She had no appetite and was not around anyone with similar symptoms. She started having shortness of breath last night and a fever of 100.4. She tried her home inhalers, nebulizers, and APAP; but nothing provided lasting relief. She talked to her PCP this morning and it was recommended that she come to the ED. She reports no alleviating or aggravating factors.   ED Course: No fevers noted in the ED. She had a slightly elevated BNP from the previous one last month. She had a slightly elevated WBC. Bld Cx were taken and UA performed. She was started on zosyn. TRH was called for admission.   **Interim History Patient continues to have abdominal pain and so we will obtain a CT scan of the abdomen pelvis.  Also obtain a VQ scan given her oxygen requirement she continues to have some dyspnea.  Has not had any fevers but had a subjective fever at home.  Assessment & Plan:   Active Problems:   Fever of unknown origin (FUO)   Fever of unknown origin  Nausea and vomiting Epigastric abdominal pain -Admitted to MedSurg as an observation but will change to inpatient -C/w phenergan for nausea or vomiting; NPO for tonight and now on a CLD -Check KUB and showed "Scattered large and small bowel gas is noted. No abnormal mass or abnormal calcifications are seen. Left hip replacement is now noted. Mild degenerative changes of lumbar spine are seen." -Will obtain CT Abd/Pelvis w/o Contrast and  showed "No acute findings within the abdomen or pelvis. Lung bases demonstrate mild cardiomegaly, small effusions and lung base opacities consistent with  atelectasis, without significant change from the prior CT.  Findings of polycystic kidney disease including multiple liver cysts, also unchanged. Mild stable splenomegaly. Possible gallstone.  No acute cholecystitis.  Normal appearance of the right pelvic renal transplant.  Aortic atherosclerosis. Small fat containing paraumbilical hernia." -She is given Zosyn in the ED and blood cultures were sent; he is immunocompromise given her renal transplant and will continue antibiotics for now -Fluid bolus in the ED but given her history of heart failure with preserved ejection fraction we will hold further fluids for now -U/A was unremarkable for infection and showed small hemoglobin and rare bacteria with 0-5 squamous epithelial cells, 0-5 WBCs, as well as negative leukocytes, negative nitrites -Blood cultures no growth to date at 2 days -WBC was 12.4 and likely in setting of dehydration and trended down to 7.0 is now improved -C/w Protonix, GI cocktail  HFpEF -BNP on Admission was 436.0 but she appears dry -Hold lasix tonight; resume in AM dependent on fluid status -Strict I's and O's and daily weights -Monitor and repeat chest x-ray in the a.m.  Dyspnea with chronic respiratory failure of 2 L in a patient with a history of COPD Hx of Asthma -Continue with nebulized breathing treatment; she is now on DuoNebs every 6 hours scheduled as well as Pulmicort twice daily scheduled; also continue with Xopenex 0.63 mg nebs every  6 hours as needed wheezing -Uses O2 up to 2L at home; currently on 1.5L -With her fever at home and a clear CXR, D-Dimer was checked and was 3.08; if elevated, check VQ study -V/Q Study showed a normal Perfusion study -Guaifenesin 600 g p.o. twice daily -Continue supplemental oxygen via nasal cannula and wean O2 as tolerated -Use  pulse oximetry maintain O2 saturation greater than 90%  Hx of Renal Transplant. -Patient has a history of polycystic kidney disease -Patient BUNs her creatinine went from 56/1.17 is now 60/1.35 -Continue with mycophenolate 180 mg p.o. twice daily, tacrolimus 1 mg p.o. twice daily, and prednisone 7.5 mg/day -Continue valacyclovir 500 mg p.o. twice daily  Chronic macrocytic anemia likely in the setting of her chronic kidney disease -Patient's hemoglobin/hematocrit went from 9.6/32.1 is now 9.1/30.4  -check anemia panel in the a.m. -Continue to monitor for signs and symptoms of bleeding; currently no overt bleeding noted -Repeat CBC in a.m.  GERD -PPI with pantoprazole 40 mg p.o. twice daily  Anxiety and Depression -Continue with home fluoxetine 40 once p.o. daily  Glaucoma -Continue with brimonidine 1 drop both eyes twice daily, cyclosporine 1 drop both eyes twice daily, dorzolamide 1 drop both eyes daily, as well as latanoprost 1 drop both eyes nightly  DVT prophylaxis: Enoxaparin 40 mill subcu every 24 Code Status: FULL CODE Family Communication: No family present at bedside Disposition Plan: Pending further clinical improvement back to baseline and tolerance of p.o. diet  Status is: Inpatient  Remains inpatient appropriate because:Unsafe d/c plan, IV treatments appropriate due to intensity of illness or inability to take PO and Inpatient level of care appropriate due to severity of illness   Dispo: The patient is from: Home              Anticipated d/c is to: Home              Anticipated d/c date is: 2 days              Patient currently is not medically stable to d/c.   Consultants:   None   Procedures: None  Antimicrobials: Anti-infectives (From admission, onward)   Start     Dose/Rate Route Frequency Ordered Stop   10/23/20 2200  valACYclovir (VALTREX) tablet 500 mg        500 mg Oral 2 times daily 10/23/20 1819     10/23/20 2200  piperacillin-tazobactam  (ZOSYN) IVPB 3.375 g        3.375 g 12.5 mL/hr over 240 Minutes Intravenous Every 8 hours 10/23/20 1821     10/23/20 1530  piperacillin-tazobactam (ZOSYN) IVPB 3.375 g        3.375 g 100 mL/hr over 30 Minutes Intravenous  Once 10/23/20 1526 10/23/20 1606   10/23/20 1515  piperacillin-tazobactam (ZOSYN) IVPB 4.5 g  Status:  Discontinued        4.5 g 200 mL/hr over 30 Minutes Intravenous  Once 10/23/20 1509 10/23/20 1526        Subjective: Seen and examined at bedside and was still feeling dyspneic and also complaining of significant abdominal pain.  Still felt a little nauseous.  Denies any lightheadedness or dizziness currently.  No other concerns or complaints at this time but was leery about taking her prednisone this morning being n.p.o.  Objective: Vitals:   10/24/20 0351 10/24/20 0622 10/24/20 0809 10/24/20 1332  BP: (!) 89/56 101/62  95/61  Pulse: 78 70  78  Resp: 16 14  20   Temp: 97.6  F (36.4 C) (!) 97.5 F (36.4 C)  97.9 F (36.6 C)  TempSrc: Oral Oral  Oral  SpO2: 100% 97% 98% 100%  Weight:      Height:        Intake/Output Summary (Last 24 hours) at 10/24/2020 1629 Last data filed at 10/24/2020 1306 Gross per 24 hour  Intake --  Output 700 ml  Net -700 ml   Filed Weights   10/23/20 1247  Weight: 68.9 kg   Examination: Physical Exam:  Constitutional: WN/WD overweight Caucasian female currently in no acute distress appears fatigued and slightly uncomfortable Eyes: Lids and conjunctivae normal, sclerae anicteric  ENMT: External Ears, Nose appear normal. Grossly normal hearing.  Neck: Appears normal, supple, no cervical masses, normal ROM, no appreciable thyromegaly; no JVD Respiratory: Diminished to auscultation bilaterally with coarse breath sounds, no wheezing, rales, rhonchi or crackles. Normal respiratory effort and patient is not tachypenic. No accessory muscle use.  Wearing supplemental oxygen via cannula Cardiovascular: RRR, no murmurs / rubs / gallops.  S1 and S2 auscultated.  Minimal extremity edema Abdomen: Soft, tender to palpate, distended secondary to body habitus. Bowel sounds positive.  GU: Deferred. Musculoskeletal: No clubbing / cyanosis of digits/nails. No joint deformity upper and lower extremities.  Skin: No rashes, lesions, ulcers on limited skin evaluation. No induration; Warm and dry.  Neurologic: CN 2-12 grossly intact with no focal deficits. Romberg sign and cerebellar reflexes not assessed.  Psychiatric: Normal judgment and insight. Alert and oriented x 3. Normal mood and appropriate affect.   Data Reviewed: I have personally reviewed following labs and imaging studies  CBC: Recent Labs  Lab 10/23/20 1329 10/24/20 0644  WBC 12.4* 7.0  NEUTROABS 10.8*  --   HGB 9.6* 9.1*  HCT 32.1* 30.4*  MCV 104.6* 104.8*  PLT 155 086*   Basic Metabolic Panel: Recent Labs  Lab 10/23/20 1329 10/24/20 0644  NA 134* 137  K 3.8 5.0  CL 97* 102  CO2 26 22  GLUCOSE 121* 166*  BUN 56* 60*  CREATININE 1.17* 1.35*  CALCIUM 9.6 9.3   GFR: Estimated Creatinine Clearance: 37.8 mL/min (A) (by C-G formula based on SCr of 1.35 mg/dL (H)). Liver Function Tests: Recent Labs  Lab 10/23/20 1329 10/24/20 0644  AST 15 13*  ALT 15 14  ALKPHOS 76 62  BILITOT 1.5* 1.1  PROT 7.4 6.5  ALBUMIN 3.0* 2.7*   Recent Labs  Lab 10/23/20 1329  LIPASE 23   No results for input(s): AMMONIA in the last 168 hours. Coagulation Profile: No results for input(s): INR, PROTIME in the last 168 hours. Cardiac Enzymes: No results for input(s): CKTOTAL, CKMB, CKMBINDEX, TROPONINI in the last 168 hours. BNP (last 3 results) No results for input(s): PROBNP in the last 8760 hours. HbA1C: No results for input(s): HGBA1C in the last 72 hours. CBG: No results for input(s): GLUCAP in the last 168 hours. Lipid Profile: No results for input(s): CHOL, HDL, LDLCALC, TRIG, CHOLHDL, LDLDIRECT in the last 72 hours. Thyroid Function Tests: No results for  input(s): TSH, T4TOTAL, FREET4, T3FREE, THYROIDAB in the last 72 hours. Anemia Panel: No results for input(s): VITAMINB12, FOLATE, FERRITIN, TIBC, IRON, RETICCTPCT in the last 72 hours. Sepsis Labs: Recent Labs  Lab 10/23/20 1233  LATICACIDVEN 1.0    Recent Results (from the past 240 hour(s))  Culture, blood (routine x 2)     Status: None (Preliminary result)   Collection Time: 10/23/20  1:29 PM   Specimen: Site Not Specified; Blood  Result Value Ref Range Status   Specimen Description   Final    SITE NOT SPECIFIED Performed at [facility] [address], [place], [place] [address]    Special Requests   Final    BOTTLES DRAWN AEROBIC AND ANAEROBIC Blood Culture results may not be optimal due to an excessive volume of blood received in culture bottles Performed at [facility] [address], [place], [place] [address]    Culture   Final    NO GROWTH < 24 HOURS Performed at [facility] [address] [place], [place] [address]    Report Status PENDING  Incomplete  Resp Panel by RT-PCR (Flu A&B, Covid) Nasopharyngeal Swab     Status: None   Collection Time: 10/23/20  1:29 PM   Specimen: Nasopharyngeal Swab; Nasopharyngeal(NP) swabs in vial transport medium  Result Value Ref Range Status   SARS Coronavirus 2 by RT PCR NEGATIVE NEGATIVE Final    Comment: (NOTE) SARS-CoV-2 target nucleic acids are NOT DETECTED.  The SARS-CoV-2 RNA is generally detectable in upper respiratory specimens during the acute phase of infection. The lowest concentration of SARS-CoV-2 viral copies this assay can detect is 138 copies/mL. A negative result does not preclude SARS-Cov-2 infection and should not be used as the sole basis for treatment or other patient management decisions. A negative result may occur with  improper specimen collection/handling, submission of specimen other than nasopharyngeal swab, presence of viral mutation(s) within  the areas targeted by this assay, and inadequate number of viral copies(<138 copies/mL). A negative result must be combined with clinical observations, patient history, and epidemiological information. The expected result is Negative.  Fact Sheet for Patients:  EntrepreneurPulse.com.au  Fact Sheet for Healthcare Providers:  IncredibleEmployment.be  This test is no t yet approved or cleared by the [place] FDA and  has been authorized for detection and/or diagnosis of SARS-CoV-2 by FDA under an Emergency Use Authorization (EUA). This EUA will remain  in effect (meaning this test can be used) for the duration of the COVID-19 declaration under Section 564(b)(1) of the Act, 21 U.S.C.section 360bbb-3(b)(1), unless the authorization is terminated  or revoked sooner.       Influenza A by PCR NEGATIVE NEGATIVE Final   Influenza B by PCR NEGATIVE NEGATIVE Final    Comment: (NOTE) The Xpert Xpress SARS-CoV-2/FLU/RSV plus assay is intended as an aid in the diagnosis of influenza from Nasopharyngeal swab specimens and should not be used as a sole basis for treatment. Nasal washings and aspirates are unacceptable for Xpert Xpress SARS-CoV-2/FLU/RSV testing.  Fact Sheet for Patients: EntrepreneurPulse.com.au  Fact Sheet for Healthcare Providers: IncredibleEmployment.be  This test is not yet approved or cleared by the [place] FDA and has been authorized for detection and/or diagnosis of SARS-CoV-2 by FDA under an Emergency Use Authorization (EUA). This EUA will remain in effect (meaning this test can be used) for the duration of the COVID-19 declaration under Section 564(b)(1) of the Act, 21 U.S.C. section 360bbb-3(b)(1), unless the authorization is terminated or revoked.  Performed at [facility], [place] [address], [place],  [address]      RN Pressure Injury  Documentation: Pressure Injury 10/23/20 Sacrum Right;Left Stage 2 -  Partial thickness loss of dermis presenting as a shallow open injury with a red, pink wound bed without slough. (Active)  10/23/20 2058  Location: Sacrum  Location Orientation: Right;Left  Staging: Stage 2 -  Partial thickness loss of dermis presenting  as a shallow open injury with a red, pink wound bed without slough.  Wound Description (Comments):   Present on Admission:      Estimated body mass index is 28.7 kg/m as calculated from the following:   Height as of this encounter: 5\' 1"  (1.549 m).   Weight as of this encounter: 68.9 kg.  Malnutrition Type:      Malnutrition Characteristics:      Nutrition Interventions:        Radiology Studies: CT ABDOMEN PELVIS WO CONTRAST  Result Date: 10/24/2020 CLINICAL DATA:  Abdominal pain. EXAM: CT ABDOMEN AND PELVIS WITHOUT CONTRAST TECHNIQUE: Multidetector CT imaging of the abdomen and pelvis was performed following the standard protocol without IV contrast. COMPARISON:  09/04/2020 FINDINGS: Lower chest: Mild cardiomegaly. Small effusions. Lung base opacities likely atelectasis. Findings similar to the prior study. Hepatobiliary: Liver normal in size and overall attenuation. There are multiple circumscribed low-density masses consistent with numerous cysts. An area less well-defined hypoattenuation lies in the left lobe adjacent to the falciform ligament, similar to the prior study, which may reflect focal fat infiltration. Subtle small density suggested in the dependent lower gallbladder segment which may reflect a stone. Gallbladder otherwise unremarkable. No bile duct dilation. Pancreas: Unremarkable. No pancreatic ductal dilatation or surrounding inflammatory changes. Spleen: Enlarged spleen, 14 cm in greatest dimension, unchanged. No splenic mass. Adrenals/Urinary Tract: No adrenal masses. Numerous bilateral renal masses, the majority of which are hypoattenuating,  with areas of thin intervening renal parenchyma. Overall diffuse parenchymal thinning. Findings are stable from the prior exam consist polycystic kidney disease. No convincing collecting system stones. No hydronephrosis. Normal ureters. Bladder mildly distended and partly obscured by left hip arthroplasty artifact. Bladder otherwise unremarkable. Right pelvic renal transplant kidney is normal in size. No mass or hydronephrosis and no change. Stomach/Bowel: Stomach is within normal limits. Appendix appears normal. No evidence of bowel wall thickening, distention, or inflammatory changes. Vascular/Lymphatic: Aortic atherosclerosis. No enlarged lymph nodes. Reproductive: Unremarkable. Other: Small fat containing paraumbilical hernia. Oval area of soft tissue attenuation along the left inguinal canal, which may reflect localized fluid, but is nonspecific. It is unchanged from the prior CT. Musculoskeletal: Old right rib fractures. No acute fractures. No osteoblastic or osteolytic lesions. IMPRESSION: 1. No acute findings within the abdomen or pelvis. 2. Lung bases demonstrate mild cardiomegaly, small effusions and lung base opacities consistent with atelectasis, without significant change from the prior CT. 3. Findings of polycystic kidney disease including multiple liver cysts, also unchanged. 4. Mild stable splenomegaly. 5. Possible gallstone.  No acute cholecystitis. 6. Normal appearance of the right pelvic renal transplant. 7. Aortic atherosclerosis. Small fat containing paraumbilical hernia. Electronically Signed   By: Lajean Manes M.D.   On: 10/24/2020 15:36   DG Chest 1 View  Result Date: 10/23/2020 CLINICAL DATA:  Shortness of breath, fever. EXAM: CHEST  1 VIEW COMPARISON:  October 06, 2020. FINDINGS: Stable cardiomediastinal silhouette. Hypoinflation of the lungs is noted with minimal bibasilar subsegmental atelectasis. No pneumothorax or pleural effusion is noted. Bony thorax is unremarkable. IMPRESSION:  Hypoinflation of the lungs with minimal bibasilar subsegmental atelectasis. Electronically Signed   By: Marijo Conception M.D.   On: 10/23/2020 12:43   DG Abd 1 View  Result Date: 10/23/2020 CLINICAL DATA:  Weakness and fevers EXAM: ABDOMEN - 1 VIEW COMPARISON:  04/19/2019 FINDINGS: Scattered large and small bowel gas is noted. No abnormal mass or abnormal calcifications are seen. Left hip replacement is now noted. Mild degenerative changes of lumbar  spine are seen. IMPRESSION: No acute abnormality noted. Electronically Signed   By: Inez Catalina M.D.   On: 10/23/2020 19:09   NM Pulmonary Perfusion  Result Date: 10/24/2020 CLINICAL DATA:  Shortness of breath for several days. EXAM: NUCLEAR MEDICINE PERFUSION LUNG SCAN TECHNIQUE: Perfusion images were obtained in multiple projections after intravenous injection of radiopharmaceutical. Ventilation scans intentionally deferred if perfusion scan and chest x-ray adequate for interpretation during COVID 19 epidemic. RADIOPHARMACEUTICALS:  4.4 mCi Tc-29m MAA IV COMPARISON:  Chest x-ray 10/23/2020. FINDINGS: No evidence for wedge-shaped peripheral perfusion defect in either lung. IMPRESSION: Normal perfusion study. Electronically Signed   By: Misty Stanley M.D.   On: 10/24/2020 12:13        Scheduled Meds:  acetaminophen  650 mg Oral Once   brimonidine  1 drop Both Eyes BID   ipratropium-albuterol  3 mL Nebulization Q6H   And   budesonide (PULMICORT) nebulizer solution  0.5 mg Nebulization BID   cycloSPORINE  1 drop Both Eyes BID   dorzolamide  1 drop Both Eyes Daily   enoxaparin (LOVENOX) injection  40 mg Subcutaneous Q24H   FLUoxetine  40 mg Oral Daily   gabapentin  200 mg Oral TID   guaiFENesin  600 mg Oral BID   iohexol       latanoprost  1 drop Both Eyes QHS   magnesium oxide  400 mg Oral BID   mycophenolate  180 mg Oral BID   pantoprazole  40 mg Oral BID   predniSONE  7.5 mg Oral Q breakfast   tacrolimus  1 mg Oral BID    valACYclovir  500 mg Oral BID   cyanocobalamin  1,000 mcg Oral Daily   Continuous Infusions:  piperacillin-tazobactam (ZOSYN)  IV 3.375 g (10/24/20 0604)     LOS: 0 days   Kerney Elbe, DO [facility] PAGER is on AMION  If 7PM-7AM, please contact night-coverage [email or web address]

## 2020-10-25 ENCOUNTER — Inpatient Hospital Stay (HOSPITAL_COMMUNITY): Payer: Medicare Other

## 2020-10-25 DIAGNOSIS — N189 Chronic kidney disease, unspecified: Secondary | ICD-10-CM

## 2020-10-25 DIAGNOSIS — N179 Acute kidney failure, unspecified: Secondary | ICD-10-CM

## 2020-10-25 LAB — CREATININE, URINE, RANDOM: Creatinine, Urine: 34.56 mg/dL

## 2020-10-25 LAB — COMPREHENSIVE METABOLIC PANEL
ALT: 12 U/L (ref 0–44)
AST: 13 U/L — ABNORMAL LOW (ref 15–41)
Albumin: 2.6 g/dL — ABNORMAL LOW (ref 3.5–5.0)
Alkaline Phosphatase: 49 U/L (ref 38–126)
Anion gap: 9 (ref 5–15)
BUN: 76 mg/dL — ABNORMAL HIGH (ref 8–23)
CO2: 24 mmol/L (ref 22–32)
Calcium: 8.9 mg/dL (ref 8.9–10.3)
Chloride: 98 mmol/L (ref 98–111)
Creatinine, Ser: 1.65 mg/dL — ABNORMAL HIGH (ref 0.44–1.00)
GFR, Estimated: 35 mL/min — ABNORMAL LOW (ref >60)
Glucose, Bld: 150 mg/dL — ABNORMAL HIGH (ref 70–99)
Potassium: 4.7 mmol/L (ref 3.5–5.1)
Sodium: 131 mmol/L — ABNORMAL LOW (ref 135–145)
Total Bilirubin: 0.6 mg/dL (ref 0.3–1.2)
Total Protein: 6.4 g/dL — ABNORMAL LOW (ref 6.5–8.1)

## 2020-10-25 LAB — CBC WITH DIFFERENTIAL/PLATELET
Abs Immature Granulocytes: 0.04 10*3/uL (ref 0.00–0.07)
Basophils Absolute: 0 10*3/uL (ref 0.0–0.1)
Basophils Relative: 0 %
Eosinophils Absolute: 0 10*3/uL (ref 0.0–0.5)
Eosinophils Relative: 0 %
HCT: 28 % — ABNORMAL LOW (ref 36.0–46.0)
Hemoglobin: 8.4 g/dL — ABNORMAL LOW (ref 12.0–15.0)
Immature Granulocytes: 1 %
Lymphocytes Relative: 6 %
Lymphs Abs: 0.3 10*3/uL — ABNORMAL LOW (ref 0.7–4.0)
MCH: 31.3 pg (ref 26.0–34.0)
MCHC: 30 g/dL (ref 30.0–36.0)
MCV: 104.5 fL — ABNORMAL HIGH (ref 80.0–100.0)
Monocytes Absolute: 0.1 10*3/uL (ref 0.1–1.0)
Monocytes Relative: 2 %
Neutro Abs: 5.2 10*3/uL (ref 1.7–7.7)
Neutrophils Relative %: 91 %
Platelets: 136 10*3/uL — ABNORMAL LOW (ref 150–400)
RBC: 2.68 MIL/uL — ABNORMAL LOW (ref 3.87–5.11)
RDW: 19 % — ABNORMAL HIGH (ref 11.5–15.5)
WBC: 5.7 10*3/uL (ref 4.0–10.5)
nRBC: 0 % (ref 0.0–0.2)

## 2020-10-25 LAB — MAGNESIUM: Magnesium: 2.5 mg/dL — ABNORMAL HIGH (ref 1.7–2.4)

## 2020-10-25 LAB — SODIUM, URINE, RANDOM: Sodium, Ur: 10 mmol/L

## 2020-10-25 LAB — PHOSPHORUS: Phosphorus: 5.1 mg/dL — ABNORMAL HIGH (ref 2.5–4.6)

## 2020-10-25 MED ORDER — SODIUM CHLORIDE 0.45 % IV SOLN: INTRAVENOUS | Status: DC

## 2020-10-25 MED ORDER — SODIUM CHLORIDE 0.9 % IV SOLN: INTRAVENOUS | Status: DC

## 2020-10-25 NOTE — Progress Notes (Signed)
PROGRESS NOTE    April [name]  GDJ:[id number] DOB: 09-19-1956 DOA: 10/23/2020 PCP: [name], MD   Brief Narrative:  HPI per Dr. Cherylann Ratel on 10/23/20 April [name] is a 64 y.o. female with medical history significant of polycystic kidney disease, renal transplant, CHF, asthma. Presenting with fever, nausea, and dyspnea. Her symptoms started yesterday morning with fatigue that was severe enough that she could not get out of bed. She then noted nausea through out the day with some periods of dry heaving. She had no appetite and was not around anyone with similar symptoms. She started having shortness of breath last night and a fever of 100.4. She tried her home inhalers, nebulizers, and APAP; but nothing provided lasting relief. She talked to her PCP this morning and it was recommended that she come to the ED. She reports no alleviating or aggravating factors.   ED Course: No fevers noted in the ED. She had a slightly elevated BNP from the previous one last month. She had a slightly elevated WBC. Bld Cx were taken and UA performed. She was started on zosyn. TRH was called for admission.   **Interim History Patient continued to have abdominal pain and so we will obtain a CT scan of the abdomen pelvis.  Also obtain a VQ scan given her oxygen requirement she continues to have some dyspnea.  Has not had any fevers but had a subjective fever at home.  10/25/2020: Patient is doing much better today and so we will advance her diet from a clear liquid to full liquid diet and in hopes of going to stop tomorrow.  We will stop her antibiotics empirically as there is no source of infection identified.  Because her renal function is gone up we have consulted nephrology and started gentle IV fluid hydration with normal saline at 25 mils per hour.  We will stop the Zosyn which may be also contributing to her worsening renal function.  Assessment & Plan:   Active Problems:   Fever of unknown  origin (FUO)   Fever of unknown origin  Nausea and vomiting, improving Epigastric abdominal pain, improved -Admitted to MedSurg as an observation but will change to inpatient -C/w phenergan for nausea or vomiting; she is n.p.o. initially and then placed on a clear liquid diet and today will advance to full liquid diet and hopefully advance to soft in the morning -Check KUB and showed "Scattered large and small bowel gas is noted. No abnormal mass or abnormal calcifications are seen. Left hip replacement is now noted. Mild degenerative changes of lumbar spine are seen." -Will obtain CT Abd/Pelvis w/o Contrast and showed "No acute findings within the abdomen or pelvis. Lung bases demonstrate mild cardiomegaly, small effusions and lung base opacities consistent with  atelectasis, without significant change from the prior CT.  Findings of polycystic kidney disease including multiple liver cysts, also unchanged. Mild stable splenomegaly. Possible gallstone.  No acute cholecystitis.  Normal appearance of the right pelvic renal transplant.  Aortic atherosclerosis. Small fat containing paraumbilical hernia." -She is given Zosyn in the ED and blood cultures were sent; she is immunocompromised given her renal transplant and after no source of infection has been identified will stop IV Zosyn now -Fluid bolus in the ED but given her history of heart failure with preserved ejection fraction we will hold further fluids for now -U/A was unremarkable for infection and showed small hemoglobin and rare bacteria with 0-5 squamous epithelial cells, 0-5 WBCs, as well as negative  leukocytes, negative nitrites -Blood cultures no growth to date at 2 days -WBC was 12.4 and likely in setting of dehydration and trended down to 7.0 is now improved further to 5.7 -C/w Protonix, GI cocktail  HFpEF -BNP on Admission was 436.0 but she appears dry -Hold lasix tonight; resume in AM dependent on fluid status -We will gently start  normal saline 75 MLS per hour given her worsening renal function and continue monitor volume status carefully but nephrology recommended to be changed to half-normal saline at 75 MLS per hour -Strict I's and O's and daily weights -Monitor and repeat chest x-ray in the a.m.  Dyspnea with chronic respiratory failure of 2 L in a patient with a history of COPD, stable Hx of Asthma -Continue with nebulized breathing treatment; she is now on DuoNebs every 6 hours scheduled as well as Pulmicort twice daily scheduled; also continue with Xopenex 0.63 mg nebs every 6 hours as needed wheezing -Uses O2 up to 2L at home; currently on 1.5L -With her fever at home and a clear CXR, D-Dimer was checked and was 3.08; if elevated, check VQ study -V/Q Study showed a normal Perfusion study -Guaifenesin 600 g p.o. twice daily -Continue supplemental oxygen via nasal cannula and wean O2 as tolerated -Use pulse oximetry maintain O2 saturation greater than 90% -Continues to remain on oxygen  Hx of Renal Transplant.  She of chronic kidney disease stage IIIa Hyperphosphatemia, hypermagnesemia -Patient has a history of polycystic kidney disease -Patient BUNs her creatinine went from 56/1.17 is now 60/1.35 and further worsened today to 76/1.65 -Continue with mycophenolate 180 mg p.o. twice daily, tacrolimus 1 mg p.o. twice daily, and prednisone 7.5 mg/day -Patient's Phos level was 5.1, mag level was 2.5 we will continue monitor and trend -Consulted nephrology for further evaluation recommendations and recent Prograf level was supratherapeutic as an outpatient visit.  Creatinine was elevated so she is started on IV fluids with normal saline but nephrology is changed to half-normal saline at 65 mils per hour -Nephrology feels that her baseline creatinine ranges from 1.17-1.51 and is slightly above baseline and because she has no evidence of volume overload pulmonary edema the recommended fluids as above -Continue  valacyclovir 500 mg p.o. twice daily  Chronic macrocytic anemia likely in the setting of her chronic kidney disease -Patient's hemoglobin/hematocrit went from 9.6/32.1 is now 9.1/30.4  -check anemia panel in the a.m. -Continue to monitor for signs and symptoms of bleeding; currently no overt bleeding noted -Repeat CBC in a.m.  Hyponatremia -Patient sodium dropped to 131 -initially started normal saline 25 MLS per hour but nephrology recommends continuing the half-normal saline at 65 mils per hour -Continue monitor and trend and repeat CMP in a.m.  GERD -PPI with pantoprazole 40 mg p.o. twice daily  Anxiety and Depression -Continue with home fluoxetine 40 once p.o. daily  Glaucoma -Continue with brimonidine 1 drop both eyes twice daily, cyclosporine 1 drop both eyes twice daily, dorzolamide 1 drop both eyes daily, as well as latanoprost 1 drop both eyes nightly  DVT prophylaxis: Enoxaparin 40 mill subcu every 24 Code Status: FULL CODE Family Communication: No family present at bedside Disposition Plan: Pending further clinical improvement back to baseline and tolerance of p.o. diet  Status is: Inpatient  Remains inpatient appropriate because:Unsafe d/c plan, IV treatments appropriate due to intensity of illness or inability to take PO and Inpatient level of care appropriate due to severity of illness   Dispo: The patient is from: Home  Anticipated d/c is to: Home              Anticipated d/c date is: 2 days              Patient currently is not medically stable to d/c.   Consultants:   Nephrology   Procedures: None  Antimicrobials: Anti-infectives (From admission, onward)   Start     Dose/Rate Route Frequency Ordered Stop   10/23/20 2200  valACYclovir (VALTREX) tablet 500 mg        500 mg Oral 2 times daily 10/23/20 1819     10/23/20 2200  piperacillin-tazobactam (ZOSYN) IVPB 3.375 g        3.375 g 12.5 mL/hr over 240 Minutes Intravenous Every 8 hours  10/23/20 1821     10/23/20 1530  piperacillin-tazobactam (ZOSYN) IVPB 3.375 g        3.375 g 100 mL/hr over 30 Minutes Intravenous  Once 10/23/20 1526 10/23/20 1606   10/23/20 1515  piperacillin-tazobactam (ZOSYN) IVPB 4.5 g  Status:  Discontinued        4.5 g 200 mL/hr over 30 Minutes Intravenous  Once 10/23/20 1509 10/23/20 1526        Subjective: Seen and examined at bedside and she is feeling little bit better today and states that her nausea has resolved.  Abdominal pain is also improved.  No chest pain, lightheadedness or dizziness.  Tolerated clear liquid diet will go to full liquid diet today and hopefully soft in the a.m.  She denies any other concerns or complaints at this time and understands that the kidney doctor will come by and see her given her slightly elevated renal function.  Objective: Vitals:   10/25/20 0202 10/25/20 0458 10/25/20 0724 10/25/20 1424  BP:  128/73  117/69  Pulse:  73  74  Resp:  15  14  Temp:    98.1 F (36.7 C)  TempSrc:    Oral  SpO2: 100% 100% 100% 100%  Weight:      Height:        Intake/Output Summary (Last 24 hours) at 10/25/2020 1916 Last data filed at 10/25/2020 1300 Gross per 24 hour  Intake 1545.47 ml  Output 2000 ml  Net -454.53 ml   Filed Weights   10/23/20 1247  Weight: 68.9 kg   Examination: Physical Exam:  Constitutional: WN/WD overweight Caucasian female currently in no acute distress appears a little bit better today and will bit more comfortable Eyes: Lids and conjunctivae normal, sclerae anicteric  ENMT: External Ears, Nose appear normal. Grossly normal hearing.  Neck: Appears normal, supple, no cervical masses, normal ROM, no appreciable thyromegaly; no JVD Respiratory: Diminished to auscultation bilaterally with coarse breath sounds, no wheezing, rales, rhonchi or crackles. Normal respiratory effort and patient is not tachypenic. No accessory muscle use.  Wearing supplemental oxygen via nasal cannula Cardiovascular:  RRR, no murmurs / rubs / gallops. S1 and S2 auscultated.  Has some mild lower extremity edema Abdomen: Soft, non-tender, distended secondary to body habitus. no appreciable hepatosplenomegaly. Bowel sounds positive.  GU: Deferred. Musculoskeletal: No clubbing / cyanosis of digits/nails. No joint deformity upper and lower extremities.  Skin: No rashes, lesions, ulcers. No induration; Warm and dry.  Neurologic: CN 2-12 grossly intact with no focal deficits. Romberg sign and cerebellar reflexes not assessed.  Psychiatric: Normal judgment and insight. Alert and oriented x 3. Normal mood and appropriate affect.   Data Reviewed: I have personally reviewed following labs and imaging studies  CBC: Recent  Labs  Lab 10/23/20 1329 10/24/20 0644 10/25/20 0629  WBC 12.4* 7.0 5.7  NEUTROABS 10.8*  --  5.2  HGB 9.6* 9.1* 8.4*  HCT 32.1* 30.4* 28.0*  MCV 104.6* 104.8* 104.5*  PLT 155 140* 841*   Basic Metabolic Panel: Recent Labs  Lab 10/23/20 1329 10/24/20 0644 10/25/20 0629  NA 134* 137 131*  K 3.8 5.0 4.7  CL 97* 102 98  CO2 26 22 24   GLUCOSE 121* 166* 150*  BUN 56* 60* 76*  CREATININE 1.17* 1.35* 1.65*  CALCIUM 9.6 9.3 8.9  MG  --   --  2.5*  PHOS  --   --  5.1*   GFR: Estimated Creatinine Clearance: 31 mL/min (A) (by C-G formula based on SCr of 1.65 mg/dL (H)). Liver Function Tests: Recent Labs  Lab 10/23/20 1329 10/24/20 0644 10/25/20 0629  AST 15 13* 13*  ALT 15 14 12   ALKPHOS 76 62 49  BILITOT 1.5* 1.1 0.6  PROT 7.4 6.5 6.4*  ALBUMIN 3.0* 2.7* 2.6*   Recent Labs  Lab 10/23/20 1329  LIPASE 23   No results for input(s): AMMONIA in the last 168 hours. Coagulation Profile: No results for input(s): INR, PROTIME in the last 168 hours. Cardiac Enzymes: No results for input(s): CKTOTAL, CKMB, CKMBINDEX, TROPONINI in the last 168 hours. BNP (last 3 results) No results for input(s): PROBNP in the last 8760 hours. HbA1C: No results for input(s): HGBA1C in the last 72  hours. CBG: No results for input(s): GLUCAP in the last 168 hours. Lipid Profile: No results for input(s): CHOL, HDL, LDLCALC, TRIG, CHOLHDL, LDLDIRECT in the last 72 hours. Thyroid Function Tests: No results for input(s): TSH, T4TOTAL, FREET4, T3FREE, THYROIDAB in the last 72 hours. Anemia Panel: No results for input(s): VITAMINB12, FOLATE, FERRITIN, TIBC, IRON, RETICCTPCT in the last 72 hours. Sepsis Labs: Recent Labs  Lab 10/23/20 1233  LATICACIDVEN 1.0    Recent Results (from the past 240 hour(s))  Culture, blood (routine x 2)     Status: None (Preliminary result)   Collection Time: 10/23/20  1:29 PM   Specimen: Site Not Specified; Blood  Result Value Ref Range Status   Specimen Description   Final    SITE NOT SPECIFIED Performed at [facility] [address] [place], [place] [address]    Special Requests   Final    BOTTLES DRAWN AEROBIC AND ANAEROBIC Blood Culture results may not be optimal due to an excessive volume of blood received in culture bottles Performed at [facility] [address], [place], [place] [address]    Culture   Final    NO GROWTH 2 DAYS Performed at [facility] [address] [place], [place] [address]    Report Status PENDING  Incomplete  Resp Panel by RT-PCR (Flu A&B, Covid) Nasopharyngeal Swab     Status: None   Collection Time: 10/23/20  1:29 PM   Specimen: Nasopharyngeal Swab; Nasopharyngeal(NP) swabs in vial transport medium  Result Value Ref Range Status   SARS Coronavirus 2 by RT PCR NEGATIVE NEGATIVE Final    Comment: (NOTE) SARS-CoV-2 target nucleic acids are NOT DETECTED.  The SARS-CoV-2 RNA is generally detectable in upper respiratory specimens during the acute phase of infection. The lowest concentration of SARS-CoV-2 viral copies this assay can detect is 138 copies/mL. A negative result does not preclude SARS-Cov-2 infection and should not be used as the sole basis for treatment  or other patient management decisions. A  negative result may occur with  improper specimen collection/handling, submission of specimen other than nasopharyngeal swab, presence of viral mutation(s) within the areas targeted by this assay, and inadequate number of viral copies(<138 copies/mL). A negative result must be combined with clinical observations, patient history, and epidemiological information. The expected result is Negative.  Fact Sheet for Patients:  EntrepreneurPulse.com.au  Fact Sheet for Healthcare Providers:  IncredibleEmployment.be  This test is no t yet approved or cleared by the [place] FDA and  has been authorized for detection and/or diagnosis of SARS-CoV-2 by FDA under an Emergency Use Authorization (EUA). This EUA will remain  in effect (meaning this test can be used) for the duration of the COVID-19 declaration under Section 564(b)(1) of the Act, 21 U.S.C.section 360bbb-3(b)(1), unless the authorization is terminated  or revoked sooner.       Influenza A by PCR NEGATIVE NEGATIVE Final   Influenza B by PCR NEGATIVE NEGATIVE Final    Comment: (NOTE) The Xpert Xpress SARS-CoV-2/FLU/RSV plus assay is intended as an aid in the diagnosis of influenza from Nasopharyngeal swab specimens and should not be used as a sole basis for treatment. Nasal washings and aspirates are unacceptable for Xpert Xpress SARS-CoV-2/FLU/RSV testing.  Fact Sheet for Patients: EntrepreneurPulse.com.au  Fact Sheet for Healthcare Providers: IncredibleEmployment.be  This test is not yet approved or cleared by the [place] FDA and has been authorized for detection and/or diagnosis of SARS-CoV-2 by FDA under an Emergency Use Authorization (EUA). This EUA will remain in effect (meaning this test can be used) for the duration of the COVID-19 declaration under Section 564(b)(1) of the Act, 21 U.S.C. section  360bbb-3(b)(1), unless the authorization is terminated or revoked.  Performed at [facility], [place] [address], [place], [place] [address]      RN Pressure Injury Documentation: Pressure Injury 10/23/20 Sacrum Right;Left Stage 2 -  Partial thickness loss of dermis presenting as a shallow open injury with a red, pink wound bed without slough. (Active)  10/23/20 2058  Location: Sacrum  Location Orientation: Right;Left  Staging: Stage 2 -  Partial thickness loss of dermis presenting as a shallow open injury with a red, pink wound bed without slough.  Wound Description (Comments):   Present on Admission:      Estimated body mass index is 28.7 kg/m as calculated from the following:   Height as of this encounter: 5\' 1"  (1.549 m).   Weight as of this encounter: 68.9 kg.  Malnutrition Type:      Malnutrition Characteristics:      Nutrition Interventions:        Radiology Studies: CT ABDOMEN PELVIS WO CONTRAST  Result Date: 10/24/2020 CLINICAL DATA:  Abdominal pain. EXAM: CT ABDOMEN AND PELVIS WITHOUT CONTRAST TECHNIQUE: Multidetector CT imaging of the abdomen and pelvis was performed following the standard protocol without IV contrast. COMPARISON:  09/04/2020 FINDINGS: Lower chest: Mild cardiomegaly. Small effusions. Lung base opacities likely atelectasis. Findings similar to the prior study. Hepatobiliary: Liver normal in size and overall attenuation. There are multiple circumscribed low-density masses consistent with numerous cysts. An area less well-defined hypoattenuation lies in the left lobe adjacent to the falciform ligament, similar to the prior study, which may reflect focal fat infiltration. Subtle small density suggested in the dependent lower gallbladder segment which may reflect a stone. Gallbladder otherwise unremarkable. No bile duct dilation. Pancreas: Unremarkable. No pancreatic ductal dilatation or surrounding inflammatory changes. Spleen:  Enlarged spleen, 14 cm in greatest dimension, unchanged. No splenic mass. Adrenals/Urinary  Tract: No adrenal masses. Numerous bilateral renal masses, the majority of which are hypoattenuating, with areas of thin intervening renal parenchyma. Overall diffuse parenchymal thinning. Findings are stable from the prior exam consist polycystic kidney disease. No convincing collecting system stones. No hydronephrosis. Normal ureters. Bladder mildly distended and partly obscured by left hip arthroplasty artifact. Bladder otherwise unremarkable. Right pelvic renal transplant kidney is normal in size. No mass or hydronephrosis and no change. Stomach/Bowel: Stomach is within normal limits. Appendix appears normal. No evidence of bowel wall thickening, distention, or inflammatory changes. Vascular/Lymphatic: Aortic atherosclerosis. No enlarged lymph nodes. Reproductive: Unremarkable. Other: Small fat containing paraumbilical hernia. Oval area of soft tissue attenuation along the left inguinal canal, which may reflect localized fluid, but is nonspecific. It is unchanged from the prior CT. Musculoskeletal: Old right rib fractures. No acute fractures. No osteoblastic or osteolytic lesions. IMPRESSION: 1. No acute findings within the abdomen or pelvis. 2. Lung bases demonstrate mild cardiomegaly, small effusions and lung base opacities consistent with atelectasis, without significant change from the prior CT. 3. Findings of polycystic kidney disease including multiple liver cysts, also unchanged. 4. Mild stable splenomegaly. 5. Possible gallstone.  No acute cholecystitis. 6. Normal appearance of the right pelvic renal transplant. 7. Aortic atherosclerosis. Small fat containing paraumbilical hernia. Electronically Signed   By: Lajean Manes M.D.   On: 10/24/2020 15:36   NM Pulmonary Perfusion  Result Date: 10/24/2020 CLINICAL DATA:  Shortness of breath for several days. EXAM: NUCLEAR MEDICINE PERFUSION LUNG SCAN TECHNIQUE:  Perfusion images were obtained in multiple projections after intravenous injection of radiopharmaceutical. Ventilation scans intentionally deferred if perfusion scan and chest x-ray adequate for interpretation during COVID 19 epidemic. RADIOPHARMACEUTICALS:  4.4 mCi Tc-28m MAA IV COMPARISON:  Chest x-ray 10/23/2020. FINDINGS: No evidence for wedge-shaped peripheral perfusion defect in either lung. IMPRESSION: Normal perfusion study. Electronically Signed   By: Misty Stanley M.D.   On: 10/24/2020 12:13   DG CHEST PORT 1 VIEW  Result Date: 10/25/2020 CLINICAL DATA:  Shortness of breath. EXAM: PORTABLE CHEST 1 VIEW COMPARISON:  Chest x-ray 10/23/2020.  Chest CT 05/14/2020. FINDINGS: Mediastinum and hilar structures are stable. Mild cardiomegaly and pulmonary venous congestion. Progressive bibasilar atelectasis. Mild left base infiltrate cannot be excluded. Small bilateral pleural effusions noted. Stable bibasilar pleural-parenchymal thickening consistent scarring. No pneumothorax. IMPRESSION: 1. Mild cardiomegaly and pulmonary venous congestion. 2. Progressive bibasilar atelectasis. Mild left base infiltrate cannot be excluded. Small bilateral pleural effusions noted. Electronically Signed   By: [place]   On: 10/25/2020 06:56        Scheduled Meds: . acetaminophen  650 mg Oral Once  . brimonidine  1 drop Both Eyes BID  . ipratropium-albuterol  3 mL Nebulization Q6H   And  . budesonide (PULMICORT) nebulizer solution  0.5 mg Nebulization BID  . cycloSPORINE  1 drop Both Eyes BID  . dorzolamide  1 drop Both Eyes Daily  . enoxaparin (LOVENOX) injection  40 mg Subcutaneous Q24H  . FLUoxetine  40 mg Oral Daily  . gabapentin  200 mg Oral TID  . guaiFENesin  600 mg Oral BID  . latanoprost  1 drop Both Eyes QHS  . magnesium oxide  400 mg Oral BID  . mycophenolate  180 mg Oral BID  . pantoprazole  40 mg Oral BID  . predniSONE  7.5 mg Oral Q breakfast  . tacrolimus  1 mg Oral BID  .  valACYclovir  500 mg Oral BID  . cyanocobalamin  1,000 mcg Oral  Daily   Continuous Infusions: . sodium chloride 75 mL/hr at 10/25/20 1307  . piperacillin-tazobactam (ZOSYN)  IV 3.375 g (10/25/20 1457)     LOS: 1 day   Kerney Elbe, DO [facility] PAGER is on [place]  If 7PM-7AM, please contact night-coverage [email or web address]

## 2020-10-25 NOTE — Consult Note (Signed)
Renal Service Consult Note [facility]  April [name] 10/25/2020 April Blazing, MD Requesting Physician: Dr. Alfredia [name]  Reason for Consult: Renal transplant  HPI: The patient is a 64 y.o. year-old w/ hx of COPD, esrd sp renal transplant (b/l creat 1.2- 1.6), UTI's, diast CHF presented to ED 12/01 w/ fever, nausea and SOB x 1- 2 days. Temp was 100.4.  Tried home nebs/ MDI's w/o relief. In ED pt afeb, BNP 450 range, WBC slightly up, UA negative. Started on IV zosyn. NM perfusion lung scan neg for PE.  CT abd was negative. CXR w/o infiltrates or edema. Creat was 1.1 on admit, 1.3 yesterday and 1.6 today.  Asked to see for renal transplant.   Pt seen in room.  No c/o's at this time.  Feeling a little better.   Has had multiple admissions this year > 10.  Mostly infections (UTI, bronchitis) also COPD exacerbations, other admits for gen sepsis work-up negative.   Pt f/b Dr April [name] at [facility].  Recent labs are below.   CT abd done w/ normal appearance of the pelvic tx kidney.    ROS  denies CP  no joint pain   no HA  no blurry vision  no rash  no diarrhea  no dysuria  no difficulty voiding  no change in urine color    Past Medical History  Past Medical History:  Diagnosis Date  . Arthritis   . Asthma   . CHF (congestive heart failure) ([place])   . COPD (chronic obstructive pulmonary disease) ([place])   . Depression   . Essential hypertension 02/08/2020  . GERD (gastroesophageal reflux disease)   . Hyperlipidemia   . Hyperparathyroidism   . Polycystic kidney   . PONV (postoperative nausea and vomiting)    Past Surgical History  Past Surgical History:  Procedure Laterality Date  . 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 BILATERAL OOPHORECTOMY    . BIOPSY  05/19/2020   Procedure: BIOPSY;  Surgeon: April Ada, MD;  Location: [facility] ENDOSCOPY;  Service: Endoscopy;;  . CARPAL TUNNEL RELEASE  2000  . CESAREAN SECTION    . COLONOSCOPY WITH PROPOFOL  N/A 05/19/2020   Procedure: COLONOSCOPY WITH PROPOFOL;  Surgeon: April Ada, MD;  Location: [place];  Service: Endoscopy;  Laterality: N/A;  . ENTEROSCOPY N/A 05/19/2020   Procedure: ENTEROSCOPY;  Surgeon: April Ada, MD;  Location: [place];  Service: Endoscopy;  Laterality: N/A;  . INSERTION OF DIALYSIS CATHETER  03/31/2012   Procedure: INSERTION OF DIALYSIS CATHETER;  Surgeon: April Mould, MD;  Location: [place];  Service: Vascular;  Laterality: N/A;  insertion of dialysis catheter right internal jugular vein  . 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 ORIF TIBIA FRACTURE Left 12/23/2018   Procedure: OPEN REDUCTION INTERNAL FIXATION (ORIF) TIBIA FRACTURE;  Surgeon: April Needles, MD;  Location: [place];  Service: Orthopedics;  Laterality: Left;  . ORIF TIBIA PLATEAU Right 12/23/2018   Procedure: OPEN REDUCTION INTERNAL FIXATION (ORIF) TIBIAL PLATEAU;  Surgeon: April Needles, MD;  Location: [place];  Service: Orthopedics;  Laterality: Right;  . ORIF WRIST FRACTURE Left 12/08/2019   Procedure: OPEN REDUCTION INTERNAL FIXATION (ORIF) WRIST FRACTURE, REPAIR LACERATION LEFT FOREARM;  Surgeon: April Leitz, MD;  Location: [facility];  Service: Orthopedics;  Laterality: Left;  . TONSILLECTOMY  1967  . TOTAL HIP ARTHROPLASTY Left 12/08/2019   Procedure: TOTAL HIP ARTHROPLASTY ANTERIOR APPROACH;  Surgeon: April Leitz, MD;  Location:  [facility];  Service: Orthopedics;  Laterality: Left;  . TUBAL LIGATION  2010   Family History  Family History  Problem Relation Age of Onset  . Hypertension Mother   . Heart disease Father        CABG history  . Polycystic kidney disease Father   . Polycystic kidney disease Brother    Social History  reports that she quit smoking about 22 years ago. Her smoking use included cigarettes. She has a 3.75 pack-year smoking history. She has never used smokeless tobacco. She reports that she does not drink alcohol and does not use drugs. Allergies  Allergies   Allergen Reactions  . Infed [Iron Dextran] Other (See Comments)    Chest tightness  . Pentamidine Itching, Shortness Of Breath and Swelling  . Erythromycin [Erythromycin] Other (See Comments)    Mouth Ulcers  . Iohexol Other (See Comments)    Per patient "she has had a kidney transplant and should never have contrast"  . Oxycodone Nausea Only and Nausea And Vomiting  . Erythromycin Rash    Causes breakout in mouth  . Ultram [Tramadol Hcl] Anxiety   Home medications Prior to Admission medications   Medication Sig Start Date End Date Taking? Authorizing Provider  acetaminophen (TYLENOL) 325 MG tablet Take 2 tablets (650 mg total) by mouth every 6 (six) hours as needed for mild pain (or Fever >/= 101). 05/22/20  Yes Elgergawy, Silver Huguenin, MD  albuterol (VENTOLIN HFA) 108 (90 Base) MCG/ACT inhaler Inhale 2 puffs into the lungs every 4 (four) hours as needed for wheezing or shortness of breath. 01/04/20  Yes Angiulli, Lavon Paganini, PA-C  allopurinol (ZYLOPRIM) 300 MG tablet Take 300 mg by mouth as needed (gout).   Yes [provider]  ascorbic acid (VITAMIN C) 500 MG tablet Take 500 mg by mouth daily.   Yes [provider]  brimonidine (ALPHAGAN P) 0.1 % SOLN Place 1 drop into both eyes 2 (two) times daily.    Yes [provider]  Budeson-Glycopyrrol-Formoterol (BREZTRI AEROSPHERE) 160-9-4.8 MCG/ACT AERO Inhale 2 puffs into the lungs 2 (two) times daily. 08/20/20  Yes April Ehrich, NP  cholecalciferol (VITAMIN D3) 25 MCG (1000 UNIT) tablet Take 1 tablet (1,000 Units total) by mouth daily. 01/04/20  Yes Angiulli, Lavon Paganini, PA-C  cyanocobalamin 1000 MCG tablet Take 1,000 mcg by mouth daily.   Yes [provider]  cycloSPORINE (RESTASIS) 0.05 % ophthalmic emulsion Place 1 drop into both eyes 2 (two) times daily. 01/04/20  Yes Angiulli, Lavon Paganini, PA-C  dorzolamide (TRUSOPT) 2 % ophthalmic solution Place 1 drop into both eyes daily. 06/25/19  Yes [provider]   ferrous sulfate 325 (65 FE) MG tablet Take 325 mg by mouth daily with breakfast.   Yes [provider]  FLUoxetine (PROZAC) 40 MG capsule Take 1 capsule (40 mg total) by mouth daily. 01/04/20  Yes Angiulli, Lavon Paganini, PA-C  furosemide (LASIX) 40 MG tablet Take 80 mg by mouth daily. 10/21/20  Yes [provider]  gabapentin (NEURONTIN) 100 MG capsule Take 200 mg by mouth 3 (three) times daily.   Yes [provider]  guaiFENesin (MUCINEX) 600 MG 12 hr tablet Take 600 mg by mouth 2 (two) times daily.   Yes [provider]  ipratropium-albuterol (DUONEB) 0.5-2.5 (3) MG/3ML SOLN Take 3 mLs by nebulization every 6 (six) hours as needed (sob/wheezing).  08/24/20  Yes [provider]  isosorbide mononitrate (IMDUR) 60 MG 24 hr tablet Take 1 tablet (  60 mg total) by mouth daily. 05/23/20  Yes Elgergawy, Silver Huguenin, MD  levalbuterol (XOPENEX) 0.63 MG/3ML nebulizer solution Take 0.63 mg by nebulization every 6 (six) hours as needed for wheezing or shortness of breath. Inhale 0.75m once daily.  May take 0.634mevery six hours as needed COPD.   Yes [provider]  magnesium oxide (MAG-OX) 400 MG tablet Take 1 tablet (400 mg total) by mouth 2 (two) times daily. 01/04/20  Yes Angiulli, DaLavon PaganiniPA-C  mycophenolate (MYFORTIC) 180 MG EC tablet Take 180 mg by mouth 2 (two) times daily.   Yes [provider]  pantoprazole (PROTONIX) 40 MG tablet Take 1 tablet (40 mg total) by mouth 2 (two) times daily. 01/04/20  Yes Angiulli, DaLavon PaganiniPA-C  predniSONE (DELTASONE) 5 MG tablet Take 7.5 mg by mouth daily with breakfast.   Yes [provider]  PROGRAF 1 MG capsule Take 1 mg by mouth 2 (two) times daily.  03/22/19  Yes [provider]  promethazine (PHENERGAN) 25 MG tablet Take 1 tablet (25 mg total) by mouth every 8 (eight) hours as needed for up to 15 doses for nausea or vomiting. 09/02/20  Yes Curatolo, Adam, DO  TRAVATAN Z 0.004 % SOLN ophthalmic  solution Place 1 drop into both eyes at bedtime. 07/03/16  Yes [provider]  valACYclovir (VALTREX) 500 MG tablet Take 500 mg by mouth 2 (two) times daily.   Yes [provider]  verapamil (CALAN-SR) 240 MG CR tablet Take 240 mg by mouth daily.   Yes [provider]  allopurinol (ZYLOPRIM) 100 MG tablet Take 1 tablet (100 mg total) by mouth daily. Patient not taking: Reported on 10/23/2020 09/12/20 10/23/20  PoElodia Florence MD  oxyCODONE (ROXICODONE) 5 MG immediate release tablet Take 1 tablet (5 mg total) by mouth every 6 (six) hours as needed for up to 15 doses for breakthrough pain. Patient not taking: Reported on 10/23/2020 09/02/20   CuLennice SitesDO     Vitals:   10/25/20 0202 10/25/20 0458 10/25/20 0724 10/25/20 1424  BP:  128/73  117/69  Pulse:  73  74  Resp:  15  14  Temp:    98.1 F (36.7 C)  TempSrc:    Oral  SpO2: 100% 100% 100% 100%  Weight:      Height:       Exam Gen alert older adult female, chron ill appearing, Ox 3 No rash, cyanosis or gangrene Sclera anicteric, throat clear  No jvd or bruits Chest clear bilat to bases RRR no MRG Abd soft ntnd no mass or ascites +bs GU deferred MS no joint effusions or deformity Ext 1+ calf/ pedal edema, no wounds or ulcers Neuro is alert, Ox 3 , nf, mild hand tremors, no asterixis    Home meds:  - pred 7.5/ prograf 1 bid/ myfortic 180 bid/ valtrex 500 bid  - lasix 80 qd or bid/ imdru 60 /verapamil 240 qd   - allopurinol 300 prn/ protonix qd  - xopenex qid prn/ duoneb q 6 h prn/ breztri aerosphere bid  - oxy IR prn/ neurontin 200 tid/ prozac 40  - prn's/ vitamins/ supplements/ eyedrops     Office labs >>    Date   Creat  Prograf level   09/17/20 1.30     11/11/ 21   6 (in range)    10/07/20 1.35   Assessment/ Plan: 1. CKD 3a renal transplant - b/l creat 1.17- 1.51 (39- 52 egfr ml/min).  F/b Dr April [name], on usual regimen prograf, pred and myfortic. Recent OP prograf level wnl (= 6  from 10/03/20). Creat is at baseline range. No obstruction on CT imaging.  Here w/ SOB, hx COPD, no evidence vol overload or pulm edema. Creat up today, recommend gentle IVF"s 1/2 NS 40- 65 cc/ hr, agree w/ holding lasix for now. Cont IS medications. Will follow.  2. COPD - on mult inhaled medications, per pmd 3. Gout - allopurinol 4. BP/volume - lasix on hold, home verapamil and imdur on hold, BP's wnl      Kelly Splinter  MD 10/25/2020, 3:50 PM  Recent Labs  Lab 10/24/20 0644 10/25/20 0629  WBC 7.0 5.7  HGB 9.1* 8.4*   Recent Labs  Lab 10/24/20 0644 10/25/20 0629  K 5.0 4.7  BUN 60* 76*  CREATININE 1.35* 1.65*  CALCIUM 9.3 8.9  PHOS  --  5.1*

## 2020-10-26 DIAGNOSIS — R1013 Epigastric pain: Secondary | ICD-10-CM

## 2020-10-26 DIAGNOSIS — N1831 Chronic kidney disease, stage 3a: Secondary | ICD-10-CM

## 2020-10-26 LAB — CBC WITH DIFFERENTIAL/PLATELET
Abs Immature Granulocytes: 0.01 10*3/uL (ref 0.00–0.07)
Basophils Absolute: 0 10*3/uL (ref 0.0–0.1)
Basophils Relative: 0 %
Eosinophils Absolute: 0 10*3/uL (ref 0.0–0.5)
Eosinophils Relative: 0 %
HCT: 26.9 % — ABNORMAL LOW (ref 36.0–46.0)
Hemoglobin: 8.1 g/dL — ABNORMAL LOW (ref 12.0–15.0)
Immature Granulocytes: 0 %
Lymphocytes Relative: 13 %
Lymphs Abs: 0.6 10*3/uL — ABNORMAL LOW (ref 0.7–4.0)
MCH: 31.6 pg (ref 26.0–34.0)
MCHC: 30.1 g/dL (ref 30.0–36.0)
MCV: 105.1 fL — ABNORMAL HIGH (ref 80.0–100.0)
Monocytes Absolute: 0.2 10*3/uL (ref 0.1–1.0)
Monocytes Relative: 4 %
Neutro Abs: 3.9 10*3/uL (ref 1.7–7.7)
Neutrophils Relative %: 83 %
Platelets: 107 10*3/uL — ABNORMAL LOW (ref 150–400)
RBC: 2.56 MIL/uL — ABNORMAL LOW (ref 3.87–5.11)
RDW: 18.8 % — ABNORMAL HIGH (ref 11.5–15.5)
WBC: 4.7 10*3/uL (ref 4.0–10.5)
nRBC: 0 % (ref 0.0–0.2)

## 2020-10-26 LAB — COMPREHENSIVE METABOLIC PANEL
ALT: 10 U/L (ref 0–44)
AST: 12 U/L — ABNORMAL LOW (ref 15–41)
Albumin: 2.5 g/dL — ABNORMAL LOW (ref 3.5–5.0)
Alkaline Phosphatase: 50 U/L (ref 38–126)
Anion gap: 11 (ref 5–15)
BUN: 74 mg/dL — ABNORMAL HIGH (ref 8–23)
CO2: 22 mmol/L (ref 22–32)
Calcium: 8.4 mg/dL — ABNORMAL LOW (ref 8.9–10.3)
Chloride: 96 mmol/L — ABNORMAL LOW (ref 98–111)
Creatinine, Ser: 1.67 mg/dL — ABNORMAL HIGH (ref 0.44–1.00)
GFR, Estimated: 34 mL/min — ABNORMAL LOW (ref >60)
Glucose, Bld: 125 mg/dL — ABNORMAL HIGH (ref 70–99)
Potassium: 4.2 mmol/L (ref 3.5–5.1)
Sodium: 129 mmol/L — ABNORMAL LOW (ref 135–145)
Total Bilirubin: 0.7 mg/dL (ref 0.3–1.2)
Total Protein: 6.2 g/dL — ABNORMAL LOW (ref 6.5–8.1)

## 2020-10-26 LAB — PHOSPHORUS: Phosphorus: 3.8 mg/dL (ref 2.5–4.6)

## 2020-10-26 LAB — MAGNESIUM: Magnesium: 2.6 mg/dL — ABNORMAL HIGH (ref 1.7–2.4)

## 2020-10-26 MED ORDER — FUROSEMIDE 40 MG PO TABS
80.0000 mg | ORAL_TABLET | Freq: Every day | ORAL | Status: DC
Start: 2020-10-26 — End: 2020-11-04

## 2020-10-26 NOTE — Discharge Instructions (Signed)
Hold your lasix for 2 days, then resume as normal.  Follow up with PCP in the next week. Follow up with Nephrology as scheduled.

## 2020-10-26 NOTE — Progress Notes (Signed)
[facility] Progress Note  Subjective: alert, feeling better, wants to go home  Vitals:   10/25/20 2048 10/26/20 0220 10/26/20 0520 10/26/20 0811  BP: (!) 149/70  (!) 155/74   Pulse: 85  65   Resp: 15  20   Temp: 97.8 F (36.6 C)  97.7 F (36.5 C)   TempSrc: Oral  Oral   SpO2: 95% 100% 94% 100%  Weight:      Height:        Exam: Gen alert older adult female, chron ill appearing, Ox 3 No rash, cyanosis or gangrene Sclera anicteric, throat clear  No jvd or bruits Chest clear bilat to bases RRR no MRG Abd soft nl bs Ext 1+ calf/ pedal edema Neuro is alert, Ox 3 , nf, mild hand tremors    Home meds:  - pred 7.5/ prograf 1 bid/ myfortic 180 bid/ valtrex 500 bid  - lasix 80 qd or bid/ imdru 60 /verapamil 240 qd   - allopurinol 300 prn/ protonix qd  - xopenex qid prn/ duoneb q 6 h prn/ breztri aerosphere bid  - oxy IR prn/ neurontin 200 tid/ prozac 40  - prn's/ vitamins/ supplements/ eyedrops     Recent office labs >>    Date              Creat               Prograf level   09/17/20        1.30         11/11/ 21                              6 (in range)    10/07/20       1.35   Assessment/ Plan: 1. CKD 3a renal transplant - b/l creat 1.17- 1.51 (39- 52 egfr ml/min). F/b Dr Royce Macadamia, on usual regimen prograf, pred and myfortic. Recent OP prograf level wnl (= 6 from 10/03/20). Creat is at baseline range. No obstruction on CT imaging.  Here w/ SOB, hx COPD, no evidence vol overload or pulm edema. Creat stable today at 1.6 , got gentle IVF"s overnight. OK for dc. Would hold home lasix x 2-3 days then resume.  Will sign off.  2. COPD - on mult inhaled medications, per pmd 3. Gout - allopurinol 4. BP/volume - resume verapamil and imdur at dc, hold lasix as above 2-3 days       Rob Monta Police 10/26/2020, 1:15 PM   Recent Labs  Lab 10/25/20 0629 10/26/20 0657  K 4.7 4.2  BUN 76* 74*  CREATININE 1.65* 1.67*  CALCIUM 8.9 8.4*  PHOS 5.1* 3.8  HGB  8.4* 8.1*   Inpatient medications: . acetaminophen  650 mg Oral Once  . brimonidine  1 drop Both Eyes BID  . ipratropium-albuterol  3 mL Nebulization Q6H   And  . budesonide (PULMICORT) nebulizer solution  0.5 mg Nebulization BID  . cycloSPORINE  1 drop Both Eyes BID  . dorzolamide  1 drop Both Eyes Daily  . enoxaparin (LOVENOX) injection  40 mg Subcutaneous Q24H  . FLUoxetine  40 mg Oral Daily  . gabapentin  200 mg Oral TID  . guaiFENesin  600 mg Oral BID  . latanoprost  1 drop Both Eyes QHS  . magnesium oxide  400 mg Oral BID  . mycophenolate  180 mg Oral BID  . pantoprazole  40 mg Oral BID  .  predniSONE  7.5 mg Oral Q breakfast  . tacrolimus  1 mg Oral BID  . valACYclovir  500 mg Oral BID  . cyanocobalamin  1,000 mcg Oral Daily   . sodium chloride 65 mL/hr at 10/26/20 1242   acetaminophen **OR** acetaminophen, levalbuterol, promethazine

## 2020-10-26 NOTE — Evaluation (Signed)
Physical Therapy Evaluation Patient Details Name: April [name] MRN: [id number] DOB: 06/27/1956 Today's Date: 10/26/2020   History of Present Illness  April [name] is a 64 y.o. female with medical history significant of polycystic kidney disease, renal transplant, CHF, asthma. Presenting with fever, nausea, and dyspnea.  Clinical Impression  Pt admitted with above diagnosis.  Pt currently with functional limitations due to the deficits listed below (see PT Problem List). Pt will benefit from skilled PT to increase their independence and safety with mobility to allow discharge to the venue listed below.  Pt didn't feel too well at evaluation, but agreeable to walk in room.  She fatigued quickly and needed a sitting rest break. Pt states she is getting HHPT at this time and recommend continuing with it.      Follow Up Recommendations Home health PT    Equipment Recommendations  None recommended by PT    Recommendations for Other Services       Precautions / Restrictions Precautions Precautions: Fall      Mobility  Bed Mobility               General bed mobility comments: Pt up in recliner upon arrival and departure    Transfers Overall transfer level: Needs assistance Equipment used: Rolling walker (2 wheeled) Transfers: Sit to/from Stand Sit to Stand: Min guard;Min assist         General transfer comment: heavy cueing for hand placement with stand > sit  Ambulation/Gait Ambulation/Gait assistance: Min guard Gait Distance (Feet): 20 Feet (x2) Assistive device: Rolling walker (2 wheeled) Gait Pattern/deviations: Decreased step length - right;Decreased step length - left     General Gait Details: Pt feels like her L leg is going to give way.  Fatigues quickly and took a sitting rest break.  Stairs            Wheelchair Mobility    Modified Rankin (Stroke Patients Only)       Balance Overall balance assessment: Needs assistance          Standing balance support: Bilateral upper extremity supported Standing balance-Leahy Scale: Poor Standing balance comment: reliant on RW                             Pertinent Vitals/Pain Pain Assessment: 0-10 Pain Score: 3  Pain Location: stomach Pain Descriptors / Indicators: Aching Pain Intervention(s): Monitored during session    Home Living Family/patient expects to be discharged to:: Private residence Living Arrangements: Spouse/significant other Available Help at Discharge: Family;Available 24 hours/day;Friend(s) Type of Home: House Home Access: Ramped entrance     Home Layout: Two level;Able to live on main level with bedroom/bathroom Home Equipment: Gilford Rile - 2 wheels;Wheelchair - [employer];Shower seat;Adaptive equipment;Hospital bed;Tub bench;Hand held shower head;Transport chair Additional Comments: At home she lives with family, daughter is an OT and lives nearby, has lots of equipment    Prior Function Level of Independence: Needs assistance   Gait / Transfers Assistance Needed: AMb with RW with someone following behind her with a w/c.           Hand Dominance        Extremity/Trunk Assessment   Upper Extremity Assessment Upper Extremity Assessment: Defer to OT evaluation    Lower Extremity Assessment Lower Extremity Assessment: Generalized weakness    Cervical / Trunk Assessment Cervical / Trunk Assessment: Kyphotic  Communication   Communication: No difficulties  Cognition Arousal/Alertness: Awake/alert Behavior  During Therapy: WFL for tasks assessed/performed Overall Cognitive Status: Within Functional Limits for tasks assessed                                        General Comments      Exercises     Assessment/Plan    PT Assessment Patient needs continued PT services  PT Problem List Decreased strength;Decreased activity tolerance;Decreased balance;Decreased mobility       PT Treatment  Interventions DME instruction;Gait training;Functional mobility training;Balance training;Therapeutic exercise;Therapeutic activities    PT Goals (Current goals can be found in the Care Plan section)  Acute Rehab PT Goals Patient Stated Goal: feel better PT Goal Formulation: With patient Time For Goal Achievement: 11/09/20 Potential to Achieve Goals: Good    Frequency Min 3X/week   Barriers to discharge        Co-evaluation               AM-PAC PT "6 Clicks" Mobility  Outcome Measure Help needed turning from your back to your side while in a flat bed without using bedrails?: A Little Help needed moving from lying on your back to sitting on the side of a flat bed without using bedrails?: A Little Help needed moving to and from a bed to a chair (including a wheelchair)?: A Little Help needed standing up from a chair using your arms (e.g., wheelchair or bedside chair)?: A Little Help needed to walk in hospital room?: A Little Help needed climbing 3-5 steps with a railing? : A Lot 6 Click Score: 17    End of Session Equipment Utilized During Treatment: Gait belt Activity Tolerance: Patient tolerated treatment well;Patient limited by fatigue Patient left: in chair;with call bell/phone within reach Nurse Communication: Mobility status PT Visit Diagnosis: Difficulty in walking, not elsewhere classified (R26.2);Muscle weakness (generalized) (M62.81);Unsteadiness on feet (R26.81)    Time: 8850-2774 PT Time Calculation (min) (ACUTE ONLY): 18 min   Charges:   PT Evaluation $PT Eval Moderate Complexity: 1 Mod          Nechama Escutia L. Tamala Julian, [place] Pager [phone] 10/26/2020   Galen Manila 10/26/2020, 12:50 PM

## 2020-10-26 NOTE — Evaluation (Signed)
Occupational Therapy Evaluation Patient Details Name: April [name] MRN: [id number] DOB: 1955-12-03 Today's Date: 10/26/2020    History of Present Illness April [name] is a 64 y.o. female with medical history significant of polycystic kidney disease, renal transplant, CHF, asthma. Presenting with fever, nausea, and dyspnea.   Clinical Impression   April [name] is a 64 year old woman who presents with generalized weakness, decreased activity tolerance and impaired balance resulting in decreased independence and safety with ambulation and ADLs. Patient will benefit from skilled OT services while in hospital to improve deficits and learn compensatory strategies as needed in order to return PLOF.      Follow Up Recommendations  Home health OT    Equipment Recommendations  None recommended by OT    Recommendations for Other Services       Precautions / Restrictions Precautions Precautions: Fall Precaution Comments: dizzy with standing, tremulous in all extremities Restrictions Weight Bearing Restrictions: No      Mobility Bed Mobility               General bed mobility comments: Pt up in recliner upon arrival and departure    Transfers Overall transfer level: Needs assistance Equipment used: Rolling walker (2 wheeled) Transfers: Sit to/from Stand Sit to Stand: Min guard         General transfer comment: min guard for ambulation    Balance Overall balance assessment: Needs assistance Sitting-balance support: No upper extremity supported Sitting balance-Leahy Scale: Good     Standing balance support: During functional activity Standing balance-Leahy Scale: Poor Standing balance comment: reliant on RW                           ADL either performed or assessed with clinical judgement   ADL Overall ADL's : Needs assistance/impaired Eating/Feeding: Independent   Grooming: Independent   Upper Body Bathing: Set up;Supervision/  safety   Lower Body Bathing: Set up;Supervison/ safety   Upper Body Dressing : Set up;Supervision/safety   Lower Body Dressing: Set up;Supervision/safety Lower Body Dressing Details (indicate cue type and reason): able to don sock in sitting Toilet Transfer: Min guard;BSC;Ambulation;RW;Grab bars Toilet Transfer Details (indicate cue type and reason): reports LE fatigue after standing from toilet Toileting- Clothing Manipulation and Hygiene: Min guard;Sit to/from stand   Tub/ Shower Transfer: Min guard;Shower seat;Grab bars   Functional mobility during ADLs: Min guard;Rolling walker       Vision Patient Visual Report: No change from baseline       Perception     Praxis      Pertinent Vitals/Pain Pain Assessment: No/denies pain Pain Score: 3  Pain Location: stomach Pain Descriptors / Indicators: Aching Pain Intervention(s): Monitored during session     Hand Dominance     Extremity/Trunk Assessment Upper Extremity Assessment Upper Extremity Assessment: Generalized weakness   Lower Extremity Assessment Lower Extremity Assessment: Defer to PT evaluation   Cervical / Trunk Assessment Cervical / Trunk Assessment: Kyphotic   Communication Communication Communication: No difficulties   Cognition Arousal/Alertness: Awake/alert Behavior During Therapy: WFL for tasks assessed/performed Overall Cognitive Status: Within Functional Limits for tasks assessed                                     General Comments       Exercises     Shoulder Instructions      Home  Living Family/patient expects to be discharged to:: Private residence Living Arrangements: Spouse/significant other Available Help at Discharge: Family;Available 24 hours/day;Friend(s) Type of Home: House Home Access: Ramped entrance     Home Layout: Two level;Able to live on main level with bedroom/bathroom Alternate Level Stairs-Number of Steps: 12   Bathroom Shower/Tub: Emergency planning/management officer: Standard     Home Equipment: Environmental consultant - 2 wheels;Wheelchair - [employer];Shower seat;Adaptive equipment;Hospital bed;Tub bench;Hand held shower head;Transport chair   Additional Comments: At home she lives with family, daughter is an OT and lives nearby, has lots of equipment      Prior Functioning/Environment Level of Independence: Needs assistance  Gait / Transfers Assistance Needed: AMb with RW with someone following behind her with a w/c.              OT Problem List: Decreased strength;Impaired balance (sitting and/or standing);Decreased activity tolerance      OT Treatment/Interventions: Self-care/ADL training;DME and/or AE instruction;Therapeutic exercise;Patient/family education;Balance training;Therapeutic activities    OT Goals(Current goals can be found in the care plan section) Acute Rehab OT Goals Patient Stated Goal: feel better OT Goal Formulation: With patient Time For Goal Achievement: 11/09/20 Potential to Achieve Goals: Good  OT Frequency: Min 2X/week   Barriers to D/C:            Co-evaluation              AM-PAC OT "6 Clicks" Daily Activity     Outcome Measure Help from another person eating meals?: None Help from another person taking care of personal grooming?: None Help from another person toileting, which includes using toliet, bedpan, or urinal?: A Little Help from another person bathing (including washing, rinsing, drying)?: A Little Help from another person to put on and taking off regular upper body clothing?: A Little Help from another person to put on and taking off regular lower body clothing?: A Little 6 Click Score: 20   End of Session Equipment Utilized During Treatment: Gait belt;Rolling walker Nurse Communication: Mobility status  Activity Tolerance: Patient limited by fatigue Patient left: in chair;with call bell/phone within reach  OT Visit Diagnosis: Unsteadiness on feet  (R26.81);Muscle weakness (generalized) (M62.81)                Time: 2080-2233 OT Time Calculation (min): 18 min Charges:  OT General Charges $OT Visit: 1 Visit OT Evaluation $OT Eval Low Complexity: 1 Low  Raelin Pixler, OTR/L [place]  Office [phone] Pager: [place] 10/26/2020, 3:33 PM

## 2020-10-26 NOTE — Discharge Summary (Addendum)
Physician Discharge Summary  April [name]:[id number] DOB: 03/18/1956 DOA: 10/23/2020  PCP: [name], MD  Admit date: 10/23/2020 Discharge date: 10/26/2020  Admitted From: Home Disposition:  Discharged to home.  Recommendations for Outpatient Follow-up:  1. Follow up with PCP in 1-2 weeks 2. Follow up with nephrology as scheduled.  Home Health: HHPT    Discharge Condition: Stable  CODE STATUS: FULL   Brief/Interim Summary: Rut Betterton Jeffersis a 64 y.o.femalewith medical history significant ofpolycystic kidney disease, renal transplant, CHF, asthma. Presenting with fever, nausea, and dyspnea. Her symptoms started yesterday morning with fatigue that was severe enough that she could not get out of bed. She then noted nausea through out the day with some periods of dry heaving. She had no appetite and was not around anyone with similar symptoms. She started having shortness of breath last night and a fever of 100.4. She tried her home inhalers, nebulizers, and APAP; but nothing provided lasting relief. She talked to her PCP this morning and it was recommended that she come to the ED. She reports no alleviating or aggravating factors.  12/4: She feels that her breathing is back to baseline. She is tolerating FLD and wants to advance diet. CT ab/pelivs did not show any acute findings. Nephrology has review her kidney function and she is ok for discharge from their stand point. They recommend holding her home lasix for 2 - 3 days. Nausea and ab pain are improved. She remains afebrile. PT has reviewed and they recommend continuing HHPT. She will need to maintain follow up with her PCP and nephrology. She is ok for discharge.    Discharge Diagnoses: Nausea and vomiting, improving Epigastric abdominal pain, improved FUO HFpEF Dyspnea with chronic respiratory failure of 2 L in a patient with a history of COPD, stable Hx of Asthma Hx of Renal Transplant.  She of chronic kidney  disease stage IIIa Hyperphosphatemia Hypermagnesemia Chronic macrocytic anemia likely in the setting of her chronic kidney disease 3a CKD 3a Hyponatremia GERD Anxiety and Depression Glaucoma Gait disturbance   Discharge Instructions  Hold lasix until 10/29/20. Follow up with PCP in 1 - 2 weeks and Nephrology as scheduled.  Allergies as of 10/26/2020      Reactions   Infed [iron Dextran] Other (See Comments)   Chest tightness   Pentamidine Itching, Shortness Of Breath, Swelling   Erythromycin [erythromycin] Other (See Comments)   Mouth Ulcers   Iohexol Other (See Comments)   Per patient "she has had a kidney transplant and should never have contrast"   Oxycodone Nausea Only, Nausea And Vomiting   Erythromycin Rash   Causes breakout in mouth   Ultram [tramadol Hcl] Anxiety      Medication List    STOP taking these medications   oxyCODONE 5 MG immediate release tablet Commonly known as: Roxicodone     TAKE these medications   acetaminophen 325 MG tablet Commonly known as: TYLENOL Take 2 tablets (650 mg total) by mouth every 6 (six) hours as needed for mild pain (or Fever >/= 101). Notes to patient: 10/26/2020   albuterol 108 (90 Base) MCG/ACT inhaler Commonly known as: VENTOLIN HFA Inhale 2 puffs into the lungs every 4 (four) hours as needed for wheezing or shortness of breath. Notes to patient: 10/26/2020   allopurinol 300 MG tablet Commonly known as: ZYLOPRIM Take 300 mg by mouth as needed (gout). What changed: Another medication with the same name was removed. Continue taking this medication, and follow the directions you  see here.   ascorbic acid 500 MG tablet Commonly known as: VITAMIN C Take 500 mg by mouth daily. Notes to patient: 10/26/2020   [facility] Aerosphere 160-9-4.8 MCG/ACT Aero Generic drug: Budeson-Glycopyrrol-Formoterol Inhale 2 puffs into the lungs 2 (two) times daily. Notes to patient: 10/26/2020   brimonidine 0.1 % Soln Commonly known as:  ALPHAGAN P Place 1 drop into both eyes 2 (two) times daily. Notes to patient: 10/26/2020 @ 10:00 pm    cholecalciferol 25 MCG (1000 UNIT) tablet Commonly known as: VITAMIN D3 Take 1 tablet (1,000 Units total) by mouth daily. Notes to patient: 10/26/2020   cyanocobalamin 1000 MCG tablet Take 1,000 mcg by mouth daily. Notes to patient: 10/27/2020   cycloSPORINE 0.05 % ophthalmic emulsion Commonly known as: RESTASIS Place 1 drop into both eyes 2 (two) times daily. Notes to patient: 10/26/2020 @ 10:00 pm    dorzolamide 2 % ophthalmic solution Commonly known as: TRUSOPT Place 1 drop into both eyes daily. Notes to patient: 10/26/2020   ferrous sulfate 325 (65 FE) MG tablet Take 325 mg by mouth daily with breakfast.   FLUoxetine 40 MG capsule Commonly known as: PROZAC Take 1 capsule (40 mg total) by mouth daily.   furosemide 40 MG tablet Commonly known as: LASIX Take 2 tablets (80 mg total) by mouth daily. Resume 10/29/20. What changed: additional instructions   gabapentin 100 MG capsule Commonly known as: NEURONTIN Take 200 mg by mouth 3 (three) times daily.   guaiFENesin 600 MG 12 hr tablet Commonly known as: MUCINEX Take 600 mg by mouth 2 (two) times daily.   ipratropium-albuterol 0.5-2.5 (3) MG/3ML Soln Commonly known as: DUONEB Take 3 mLs by nebulization every 6 (six) hours as needed (sob/wheezing).   isosorbide mononitrate 60 MG 24 hr tablet Commonly known as: IMDUR Take 1 tablet (60 mg total) by mouth daily.   levalbuterol 0.63 MG/3ML nebulizer solution Commonly known as: XOPENEX Take 0.63 mg by nebulization every 6 (six) hours as needed for wheezing or shortness of breath. Inhale 0.63mg  once daily.  May take 0.63mg  every six hours as needed COPD.   magnesium oxide 400 MG tablet Commonly known as: MAG-OX Take 1 tablet (400 mg total) by mouth 2 (two) times daily.   mycophenolate 180 MG EC tablet Commonly known as: MYFORTIC Take 180 mg by mouth 2 (two) times  daily.   pantoprazole 40 MG tablet Commonly known as: PROTONIX Take 1 tablet (40 mg total) by mouth 2 (two) times daily.   predniSONE 5 MG tablet Commonly known as: DELTASONE Take 7.5 mg by mouth daily with breakfast.   Prograf 1 MG capsule Generic drug: tacrolimus Take 1 mg by mouth 2 (two) times daily.   promethazine 25 MG tablet Commonly known as: PHENERGAN Take 1 tablet (25 mg total) by mouth every 8 (eight) hours as needed for up to 15 doses for nausea or vomiting.   Travatan Z 0.004 % Soln ophthalmic solution Generic drug: Travoprost (BAK Free) Place 1 drop into both eyes at bedtime.   valACYclovir 500 MG tablet Commonly known as: VALTREX Take 500 mg by mouth 2 (two) times daily.   verapamil 240 MG CR tablet Commonly known as: CALAN-SR Take 240 mg by mouth daily.       Allergies  Allergen Reactions  . Infed [Iron Dextran] Other (See Comments)    Chest tightness  . Pentamidine Itching, Shortness Of Breath and Swelling  . Erythromycin [Erythromycin] Other (See Comments)    Mouth Ulcers  . Iohexol Other (  See Comments)    Per patient "she has had a kidney transplant and should never have contrast"  . Oxycodone Nausea Only and Nausea And Vomiting  . Erythromycin Rash    Causes breakout in mouth  . Ultram [Tramadol Hcl] Anxiety    Consultations:  Nephrology  Wound Care: Pressure Injury 10/23/20 Sacrum Right;Left Stage 2 -  Partial thickness loss of dermis presenting as a shallow open injury with a red, pink wound bed without slough. (Active)  10/23/20 2058  Location: Sacrum  Location Orientation: Right;Left  Staging: Stage 2 -  Partial thickness loss of dermis presenting as a shallow open injury with a red, pink wound bed without slough.  Wound Description (Comments):   Present on Admission:     Procedures/Studies: CT ABDOMEN PELVIS WO CONTRAST  Result Date: 10/24/2020 CLINICAL DATA:  Abdominal pain. EXAM: CT ABDOMEN AND PELVIS WITHOUT CONTRAST  TECHNIQUE: Multidetector CT imaging of the abdomen and pelvis was performed following the standard protocol without IV contrast. COMPARISON:  09/04/2020 FINDINGS: Lower chest: Mild cardiomegaly. Small effusions. Lung base opacities likely atelectasis. Findings similar to the prior study. Hepatobiliary: Liver normal in size and overall attenuation. There are multiple circumscribed low-density masses consistent with numerous cysts. An area less well-defined hypoattenuation lies in the left lobe adjacent to the falciform ligament, similar to the prior study, which may reflect focal fat infiltration. Subtle small density suggested in the dependent lower gallbladder segment which may reflect a stone. Gallbladder otherwise unremarkable. No bile duct dilation. Pancreas: Unremarkable. No pancreatic ductal dilatation or surrounding inflammatory changes. Spleen: Enlarged spleen, 14 cm in greatest dimension, unchanged. No splenic mass. Adrenals/Urinary Tract: No adrenal masses. Numerous bilateral renal masses, the majority of which are hypoattenuating, with areas of thin intervening renal parenchyma. Overall diffuse parenchymal thinning. Findings are stable from the prior exam consist polycystic kidney disease. No convincing collecting system stones. No hydronephrosis. Normal ureters. Bladder mildly distended and partly obscured by left hip arthroplasty artifact. Bladder otherwise unremarkable. Right pelvic renal transplant kidney is normal in size. No mass or hydronephrosis and no change. Stomach/Bowel: Stomach is within normal limits. Appendix appears normal. No evidence of bowel wall thickening, distention, or inflammatory changes. Vascular/Lymphatic: Aortic atherosclerosis. No enlarged lymph nodes. Reproductive: Unremarkable. Other: Small fat containing paraumbilical hernia. Oval area of soft tissue attenuation along the left inguinal canal, which may reflect localized fluid, but is nonspecific. It is unchanged from the  prior CT. Musculoskeletal: Old right rib fractures. No acute fractures. No osteoblastic or osteolytic lesions. IMPRESSION: 1. No acute findings within the abdomen or pelvis. 2. Lung bases demonstrate mild cardiomegaly, small effusions and lung base opacities consistent with atelectasis, without significant change from the prior CT. 3. Findings of polycystic kidney disease including multiple liver cysts, also unchanged. 4. Mild stable splenomegaly. 5. Possible gallstone.  No acute cholecystitis. 6. Normal appearance of the right pelvic renal transplant. 7. Aortic atherosclerosis. Small fat containing paraumbilical hernia. Electronically Signed   By: Lajean Manes M.D.   On: 10/24/2020 15:36   DG Chest 1 View  Result Date: 10/23/2020 CLINICAL DATA:  Shortness of breath, fever. EXAM: CHEST  1 VIEW COMPARISON:  October 06, 2020. FINDINGS: Stable cardiomediastinal silhouette. Hypoinflation of the lungs is noted with minimal bibasilar subsegmental atelectasis. No pneumothorax or pleural effusion is noted. Bony thorax is unremarkable. IMPRESSION: Hypoinflation of the lungs with minimal bibasilar subsegmental atelectasis. Electronically Signed   By: Marijo Conception M.D.   On: 10/23/2020 12:43   DG Abd 1 View  Result Date: 10/23/2020 CLINICAL DATA:  Weakness and fevers EXAM: ABDOMEN - 1 VIEW COMPARISON:  04/19/2019 FINDINGS: Scattered large and small bowel gas is noted. No abnormal mass or abnormal calcifications are seen. Left hip replacement is now noted. Mild degenerative changes of lumbar spine are seen. IMPRESSION: No acute abnormality noted. Electronically Signed   By: Inez Catalina M.D.   On: 10/23/2020 19:09   NM Pulmonary Perfusion  Result Date: 10/24/2020 CLINICAL DATA:  Shortness of breath for several days. EXAM: NUCLEAR MEDICINE PERFUSION LUNG SCAN TECHNIQUE: Perfusion images were obtained in multiple projections after intravenous injection of radiopharmaceutical. Ventilation scans intentionally  deferred if perfusion scan and chest x-ray adequate for interpretation during COVID 19 epidemic. RADIOPHARMACEUTICALS:  4.4 mCi Tc-69m MAA IV COMPARISON:  Chest x-ray 10/23/2020. FINDINGS: No evidence for wedge-shaped peripheral perfusion defect in either lung. IMPRESSION: Normal perfusion study. Electronically Signed   By: Misty Stanley M.D.   On: 10/24/2020 12:13   US RENAL  Result Date: 10/25/2020 CLINICAL DATA:  Acute kidney injury EXAM: RENAL / URINARY TRACT ULTRASOUND COMPLETE COMPARISON:  October 25, 2019 FINDINGS: Right Kidney: Renal measurements: 15.1 x 8.5 x 9.3 cm = volume: 622 mL. Innumerable cysts are noted throughout the right kidney. Left Kidney: Renal measurements: 13.9 x 5.9 x 8 cm = volume: 340 mL. Innumerable cysts are noted throughout the left kidney. There is a right lower quadrant pelvic transplant kidney without evidence for hydronephrosis. Bladder: Appears normal for degree of bladder distention. Other: None. IMPRESSION: 1. Findings consistent with polycystic kidney disease. 2. Unremarkable appearance of the right pelvic transplant kidney. Electronically Signed   By: Constance Holster M.D.   On: 10/25/2020 22:38   DG CHEST PORT 1 VIEW  Result Date: 10/25/2020 CLINICAL DATA:  Shortness of breath. EXAM: PORTABLE CHEST 1 VIEW COMPARISON:  Chest x-ray 10/23/2020.  Chest CT 05/14/2020. FINDINGS: Mediastinum and hilar structures are stable. Mild cardiomegaly and pulmonary venous congestion. Progressive bibasilar atelectasis. Mild left base infiltrate cannot be excluded. Small bilateral pleural effusions noted. Stable bibasilar pleural-parenchymal thickening consistent scarring. No pneumothorax. IMPRESSION: 1. Mild cardiomegaly and pulmonary venous congestion. 2. Progressive bibasilar atelectasis. Mild left base infiltrate cannot be excluded. Small bilateral pleural effusions noted. Electronically Signed   By: Marcello Moores  Register   On: 10/25/2020 06:56   DG Chest Port 1 View  Result Date:  10/06/2020 CLINICAL DATA:  Shortness of breath EXAM: PORTABLE CHEST 1 VIEW COMPARISON:  09/05/2020 FINDINGS: Left midlung atelectasis. No focal consolidation. Normal pleural spaces and cardiomediastinal contours. IMPRESSION: Left midlung atelectasis. Electronically Signed   By: Ulyses Jarred M.D.   On: 10/06/2020 19:46     Subjective: "I feel fine."  Discharge Exam: Vitals:   10/26/20 0520 10/26/20 0811  BP: (!) 155/74   Pulse: 65   Resp: 20   Temp: 97.7 F (36.5 C)   SpO2: 94% 100%   Vitals:   10/25/20 2048 10/26/20 0220 10/26/20 0520 10/26/20 0811  BP: (!) 149/70  (!) 155/74   Pulse: 85  65   Resp: 15  20   Temp: 97.8 F (36.6 C)  97.7 F (36.5 C)   TempSrc: Oral  Oral   SpO2: 95% 100% 94% 100%  Weight:      Height:        General: 64 y.o. female resting in bed in NAD Eyes: PERRL, normal sclera ENMT: Nares patent w/o discharge, orophaynx clear, dentition normal, ears w/o discharge/lesions/ulcers Neck: Supple, trachea midline Cardiovascular: RRR, +S1, S2, no m/g/r, equal pulses  throughout Respiratory: CTABL, no w/r/r, normal WOB GI: BS+, NDNT, no masses noted, no organomegaly noted MSK: No e/c/c Skin: No rashes, bruises, ulcerations noted Neuro: A&O x 3, no focal deficits Psyc: Appropriate interaction and affect, calm/cooperative   The results of significant diagnostics from this hospitalization (including imaging, microbiology, ancillary and laboratory) are listed below for reference.     Microbiology: Recent Results (from the past 240 hour(s))  Culture, blood (routine x 2)     Status: None (Preliminary result)   Collection Time: 10/23/20  1:29 PM   Specimen: Site Not Specified; Blood  Result Value Ref Range Status   Specimen Description   Final    SITE NOT SPECIFIED Performed at [facility] [address], [place], [place] [address]    Special Requests   Final    BOTTLES DRAWN AEROBIC AND ANAEROBIC Blood Culture results may not be  optimal due to an excessive volume of blood received in culture bottles Performed at [facility] [address], [place], [place] [address]    Culture   Final    NO GROWTH 3 DAYS Performed at [facility], [place] [address] [place], [place] [address]    Report Status PENDING  Incomplete  Resp Panel by RT-PCR (Flu A&B, Covid) Nasopharyngeal Swab     Status: None   Collection Time: 10/23/20  1:29 PM   Specimen: Nasopharyngeal Swab; Nasopharyngeal(NP) swabs in vial transport medium  Result Value Ref Range Status   SARS Coronavirus 2 by RT PCR NEGATIVE NEGATIVE Final    Comment: (NOTE) SARS-CoV-2 target nucleic acids are NOT DETECTED.  The SARS-CoV-2 RNA is generally detectable in upper respiratory specimens during the acute phase of infection. The lowest concentration of SARS-CoV-2 viral copies this assay can detect is 138 copies/mL. A negative result does not preclude SARS-Cov-2 infection and should not be used as the sole basis for treatment or other patient management decisions. A negative result may occur with  improper specimen collection/handling, submission of specimen other than nasopharyngeal swab, presence of viral mutation(s) within the areas targeted by this assay, and inadequate number of viral copies(<138 copies/mL). A negative result must be combined with clinical observations, patient history, and epidemiological information. The expected result is Negative.  Fact Sheet for Patients:  EntrepreneurPulse.com.au  Fact Sheet for Healthcare Providers:  IncredibleEmployment.be  This test is no t yet approved or cleared by the [place] FDA and  has been authorized for detection and/or diagnosis of SARS-CoV-2 by FDA under an Emergency Use Authorization (EUA). This EUA will remain  in effect (meaning this test can be used) for the duration of the COVID-19 declaration under Section 564(b)(1) of the Act, 21  U.S.C.section 360bbb-3(b)(1), unless the authorization is terminated  or revoked sooner.       Influenza A by PCR NEGATIVE NEGATIVE Final   Influenza B by PCR NEGATIVE NEGATIVE Final    Comment: (NOTE) The Xpert Xpress SARS-CoV-2/FLU/RSV plus assay is intended as an aid in the diagnosis of influenza from Nasopharyngeal swab specimens and should not be used as a sole basis for treatment. Nasal washings and aspirates are unacceptable for Xpert Xpress SARS-CoV-2/FLU/RSV testing.  Fact Sheet for Patients: EntrepreneurPulse.com.au  Fact Sheet for Healthcare Providers: IncredibleEmployment.be  This test is not yet approved or cleared by the [place] FDA and has been authorized for detection and/or diagnosis of SARS-CoV-2 by FDA under an Emergency Use Authorization (EUA). This EUA will remain in effect (meaning this test can  be used) for the duration of the COVID-19 declaration under Section 564(b)(1) of the Act, 21 U.S.C. section 360bbb-3(b)(1), unless the authorization is terminated or revoked.  Performed at [facility], [place] [address] [place], [place] [address]      Labs: BNP (last 3 results) Recent Labs    09/04/20 1417 10/06/20 1944 10/23/20 1333  BNP 1,005.3* 348.1* 390.3*   Basic Metabolic Panel: Recent Labs  Lab 10/23/20 1329 10/24/20 0644 10/25/20 0629 10/26/20 0657  NA 134* 137 131* 129*  K 3.8 5.0 4.7 4.2  CL 97* 102 98 96*  CO2 26 22 24 22   GLUCOSE 121* 166* 150* 125*  BUN 56* 60* 76* 74*  CREATININE 1.17* 1.35* 1.65* 1.67*  CALCIUM 9.6 9.3 8.9 8.4*  MG  --   --  2.5* 2.6*  PHOS  --   --  5.1* 3.8   Liver Function Tests: Recent Labs  Lab 10/23/20 1329 10/24/20 0644 10/25/20 0629 10/26/20 0657  AST 15 13* 13* 12*  ALT 15 14 12 10   ALKPHOS 76 62 49 50  BILITOT 1.5* 1.1 0.6 0.7  PROT 7.4 6.5 6.4* 6.2*  ALBUMIN 3.0* 2.7* 2.6* 2.5*   Recent Labs  Lab 10/23/20 1329  LIPASE 23    No results for input(s): AMMONIA in the last 168 hours. CBC: Recent Labs  Lab 10/23/20 1329 10/24/20 0644 10/25/20 0629 10/26/20 0657  WBC 12.4* 7.0 5.7 4.7  NEUTROABS 10.8*  --  5.2 3.9  HGB 9.6* 9.1* 8.4* 8.1*  HCT 32.1* 30.4* 28.0* 26.9*  MCV 104.6* 104.8* 104.5* 105.1*  PLT 155 140* 136* 107*   Cardiac Enzymes: No results for input(s): CKTOTAL, CKMB, CKMBINDEX, TROPONINI in the last 168 hours. BNP: Invalid input(s): POCBNP CBG: No results for input(s): GLUCAP in the last 168 hours. D-Dimer Recent Labs    10/23/20 1333  DDIMER 3.08*   Hgb A1c No results for input(s): HGBA1C in the last 72 hours. Lipid Profile No results for input(s): CHOL, HDL, LDLCALC, TRIG, CHOLHDL, LDLDIRECT in the last 72 hours. Thyroid function studies No results for input(s): TSH, T4TOTAL, T3FREE, THYROIDAB in the last 72 hours.  Invalid input(s): FREET3 Anemia work up No results for input(s): VITAMINB12, FOLATE, FERRITIN, TIBC, IRON, RETICCTPCT in the last 72 hours. Urinalysis    Component Value Date/Time   COLORURINE YELLOW 10/23/2020 [place] 10/23/2020 1545   LABSPEC 1.010 10/23/2020 1545   PHURINE 5.0 10/23/2020 1545   GLUCOSEU NEGATIVE 10/23/2020 1545   HGBUR SMALL (A) 10/23/2020 1545   BILIRUBINUR NEGATIVE 10/23/2020 1545   KETONESUR NEGATIVE 10/23/2020 1545   PROTEINUR 30 (A) 10/23/2020 1545   UROBILINOGEN 0.2 05/15/2013 0733   NITRITE NEGATIVE 10/23/2020 1545   LEUKOCYTESUR NEGATIVE 10/23/2020 1545   Sepsis Labs Invalid input(s): PROCALCITONIN,  WBC,  LACTICIDVEN Microbiology Recent Results (from the past 240 hour(s))  Culture, blood (routine x 2)     Status: None (Preliminary result)   Collection Time: 10/23/20  1:29 PM   Specimen: Site Not Specified; Blood  Result Value Ref Range Status   Specimen Description   Final    SITE NOT SPECIFIED Performed at [facility], [place] [address] [place], [place] [address]    Special Requests    Final    BOTTLES DRAWN AEROBIC AND ANAEROBIC Blood Culture results may not be optimal due to an excessive volume of blood received in culture bottles Performed at [facility] [address], [place], [place] [address]  Culture   Final    NO GROWTH 3 DAYS Performed at [facility], [place] [address] [place], [place] [address]    Report Status PENDING  Incomplete  Resp Panel by RT-PCR (Flu A&B, Covid) Nasopharyngeal Swab     Status: None   Collection Time: 10/23/20  1:29 PM   Specimen: Nasopharyngeal Swab; Nasopharyngeal(NP) swabs in vial transport medium  Result Value Ref Range Status   SARS Coronavirus 2 by RT PCR NEGATIVE NEGATIVE Final    Comment: (NOTE) SARS-CoV-2 target nucleic acids are NOT DETECTED.  The SARS-CoV-2 RNA is generally detectable in upper respiratory specimens during the acute phase of infection. The lowest concentration of SARS-CoV-2 viral copies this assay can detect is 138 copies/mL. A negative result does not preclude SARS-Cov-2 infection and should not be used as the sole basis for treatment or other patient management decisions. A negative result may occur with  improper specimen collection/handling, submission of specimen other than nasopharyngeal swab, presence of viral mutation(s) within the areas targeted by this assay, and inadequate number of viral copies(<138 copies/mL). A negative result must be combined with clinical observations, patient history, and epidemiological information. The expected result is Negative.  Fact Sheet for Patients:  EntrepreneurPulse.com.au  Fact Sheet for Healthcare Providers:  IncredibleEmployment.be  This test is no t yet approved or cleared by the [place] FDA and  has been authorized for detection and/or diagnosis of SARS-CoV-2 by FDA under an Emergency Use Authorization (EUA). This EUA will remain  in effect (meaning this test can be used) for the  duration of the COVID-19 declaration under Section 564(b)(1) of the Act, 21 U.S.C.section 360bbb-3(b)(1), unless the authorization is terminated  or revoked sooner.       Influenza A by PCR NEGATIVE NEGATIVE Final   Influenza B by PCR NEGATIVE NEGATIVE Final    Comment: (NOTE) The Xpert Xpress SARS-CoV-2/FLU/RSV plus assay is intended as an aid in the diagnosis of influenza from Nasopharyngeal swab specimens and should not be used as a sole basis for treatment. Nasal washings and aspirates are unacceptable for Xpert Xpress SARS-CoV-2/FLU/RSV testing.  Fact Sheet for Patients: EntrepreneurPulse.com.au  Fact Sheet for Healthcare Providers: IncredibleEmployment.be  This test is not yet approved or cleared by the [place] FDA and has been authorized for detection and/or diagnosis of SARS-CoV-2 by FDA under an Emergency Use Authorization (EUA). This EUA will remain in effect (meaning this test can be used) for the duration of the COVID-19 declaration under Section 564(b)(1) of the Act, 21 U.S.C. section 360bbb-3(b)(1), unless the authorization is terminated or revoked.  Performed at [facility], [place] [employer] [address] [place], [place] [address]      Time coordinating discharge: 45 minutes  SIGNED:   Jonnie Finner, DO  [facility] 10/26/2020, 1:23 PM   If 7PM-7AM, please contact night-coverage [email or web address]

## 2020-10-26 NOTE — Progress Notes (Signed)
Discharge instructions reviewed with patient utilizing teach back method. Patient being discharged to home with home health.

## 2020-10-26 NOTE — TOC Progression Note (Signed)
Transition of Care [facility]) - Progression Note    Patient Details  Name: April [name] MRN: [id number] Date of Birth: [birth year]  Transition of [facility]) CM/SW Contact  Joaquin Courts, RN Phone Number: 10/26/2020, 1:58 PM  Clinical Narrative:    [facility] services prior to hospital admission. Rep notified will need to resume [facility] services.    Expected Discharge Plan: [place] Barriers to Discharge: No Barriers Identified  Expected Discharge Plan and Services Expected Discharge Plan: [place] Choice: Resumption of Svcs/PTA Provider Living arrangements for the past 2 months: Single Family Home Expected Discharge Date: 10/26/20                         HH Arranged: PT HH Agency: [facility] Date [place]: 10/26/20 Time [place]: [id number] Representative spoke with at [place]: [place] ([place]) Interventions    Readmission Risk Interventions Readmission Risk Prevention Plan 05/15/2020 04/19/2020 02/09/2020  Transportation Screening Complete Complete Complete  PCP or Specialist Appt within 3-5 Days - - -  Not Complete comments - - -  HRI or [place] or Home Care Consult comments - - -  Social Work Scientific laboratory technician for [place] Planning/Counseling - - -  SW consult not completed comments - - -  Palliative Care Screening - - -  Medication Review ([place]) Referral to Pharmacy Complete Referral to Pharmacy  PCP or Specialist appointment within 3-5 days of discharge Complete Complete Complete  HRI or Home Care Consult Complete Complete Complete  SW Recovery Care/Counseling Consult Complete Complete -  Palliative Care Screening Not Applicable Not Applicable Complete  Skilled Nursing Facility Complete Complete Patient Refused  Some recent data might be hidden

## 2020-10-28 LAB — CULTURE, BLOOD (ROUTINE X 2): Culture: NO GROWTH

## 2020-11-04 ENCOUNTER — Inpatient Hospital Stay: Payer: Medicare Other | Attending: Hematology and Oncology | Admitting: Hematology and Oncology

## 2020-11-04 ENCOUNTER — Other Ambulatory Visit: Payer: Self-pay

## 2020-11-04 ENCOUNTER — Inpatient Hospital Stay: Payer: Medicare Other

## 2020-11-04 ENCOUNTER — Other Ambulatory Visit: Payer: Self-pay | Admitting: Hematology and Oncology

## 2020-11-04 VITALS — BP 151/65 | HR 76 | Temp 97.6°F | Resp 16 | Ht 61.0 in | Wt 160.9 lb

## 2020-11-04 DIAGNOSIS — D472 Monoclonal gammopathy: Secondary | ICD-10-CM

## 2020-11-04 DIAGNOSIS — F32A Depression, unspecified: Secondary | ICD-10-CM | POA: Insufficient documentation

## 2020-11-04 DIAGNOSIS — D649 Anemia, unspecified: Secondary | ICD-10-CM | POA: Diagnosis not present

## 2020-11-04 DIAGNOSIS — C9000 Multiple myeloma not having achieved remission: Secondary | ICD-10-CM | POA: Diagnosis not present

## 2020-11-04 DIAGNOSIS — Z79899 Other long term (current) drug therapy: Secondary | ICD-10-CM | POA: Diagnosis not present

## 2020-11-04 DIAGNOSIS — Z940 Kidney transplant status: Secondary | ICD-10-CM | POA: Diagnosis not present

## 2020-11-04 DIAGNOSIS — Z87891 Personal history of nicotine dependence: Secondary | ICD-10-CM | POA: Insufficient documentation

## 2020-11-04 LAB — CBC WITH DIFFERENTIAL (CANCER CENTER ONLY)
Abs Immature Granulocytes: 0.03 10*3/uL (ref 0.00–0.07)
Basophils Absolute: 0 10*3/uL (ref 0.0–0.1)
Basophils Relative: 0 %
Eosinophils Absolute: 0.1 10*3/uL (ref 0.0–0.5)
Eosinophils Relative: 1 %
HCT: 29.5 % — ABNORMAL LOW (ref 36.0–46.0)
Hemoglobin: 9 g/dL — ABNORMAL LOW (ref 12.0–15.0)
Immature Granulocytes: 1 %
Lymphocytes Relative: 19 %
Lymphs Abs: 1.2 10*3/uL (ref 0.7–4.0)
MCH: 30.9 pg (ref 26.0–34.0)
MCHC: 30.5 g/dL (ref 30.0–36.0)
MCV: 101.4 fL — ABNORMAL HIGH (ref 80.0–100.0)
Monocytes Absolute: 0.4 10*3/uL (ref 0.1–1.0)
Monocytes Relative: 7 %
Neutro Abs: 4.4 10*3/uL (ref 1.7–7.7)
Neutrophils Relative %: 72 %
Platelet Count: 241 10*3/uL (ref 150–400)
RBC: 2.91 MIL/uL — ABNORMAL LOW (ref 3.87–5.11)
RDW: 19.5 % — ABNORMAL HIGH (ref 11.5–15.5)
WBC Count: 6.1 10*3/uL (ref 4.0–10.5)
nRBC: 0 % (ref 0.0–0.2)

## 2020-11-04 LAB — LACTATE DEHYDROGENASE: LDH: 179 U/L (ref 98–192)

## 2020-11-04 LAB — CMP (CANCER CENTER ONLY)
ALT: 12 U/L (ref 0–44)
AST: 13 U/L — ABNORMAL LOW (ref 15–41)
Albumin: 2.7 g/dL — ABNORMAL LOW (ref 3.5–5.0)
Alkaline Phosphatase: 81 U/L (ref 38–126)
Anion gap: 8 (ref 5–15)
BUN: 42 mg/dL — ABNORMAL HIGH (ref 8–23)
CO2: 26 mmol/L (ref 22–32)
Calcium: 9.9 mg/dL (ref 8.9–10.3)
Chloride: 105 mmol/L (ref 98–111)
Creatinine: 1.28 mg/dL — ABNORMAL HIGH (ref 0.44–1.00)
GFR, Estimated: 47 mL/min — ABNORMAL LOW (ref >60)
Glucose, Bld: 118 mg/dL — ABNORMAL HIGH (ref 70–99)
Potassium: 3.7 mmol/L (ref 3.5–5.1)
Sodium: 139 mmol/L (ref 135–145)
Total Bilirubin: 0.6 mg/dL (ref 0.3–1.2)
Total Protein: 7.4 g/dL (ref 6.5–8.1)

## 2020-11-05 ENCOUNTER — Encounter: Payer: Self-pay | Admitting: Pharmacist

## 2020-11-05 LAB — KAPPA/LAMBDA LIGHT CHAINS
Kappa free light chain: 24.2 mg/L — ABNORMAL HIGH (ref 3.3–19.4)
Kappa, lambda light chain ratio: 1.53 (ref 0.26–1.65)
Lambda free light chains: 15.8 mg/L (ref 5.7–26.3)

## 2020-11-05 NOTE — Telephone Encounter (Signed)
Faxed FASENRA appeal to [employer]. Will send copy of appeal documents to scan center.  Fax: [phone] Phone: [phone]  Knox Saliva, PharmD, MPH Clinical Pharmacist (Rheumatology and Pulmonology)

## 2020-11-06 LAB — MULTIPLE MYELOMA PANEL, SERUM
Albumin SerPl Elph-Mcnc: 3 g/dL (ref 2.9–4.4)
Albumin/Glob SerPl: 0.8 (ref 0.7–1.7)
Alpha 1: 0.3 g/dL (ref 0.0–0.4)
Alpha2 Glob SerPl Elph-Mcnc: 0.7 g/dL (ref 0.4–1.0)
B-Globulin SerPl Elph-Mcnc: 0.8 g/dL (ref 0.7–1.3)
Gamma Glob SerPl Elph-Mcnc: 2 g/dL — ABNORMAL HIGH (ref 0.4–1.8)
Globulin, Total: 3.8 g/dL (ref 2.2–3.9)
IgA: 35 mg/dL — ABNORMAL LOW (ref 87–352)
IgG (Immunoglobin G), Serum: 2460 mg/dL — ABNORMAL HIGH (ref 586–1602)
IgM (Immunoglobulin M), Srm: 28 mg/dL (ref 26–217)
M Protein SerPl Elph-Mcnc: 1.8 g/dL — ABNORMAL HIGH
Total Protein ELP: 6.8 g/dL (ref 6.0–8.5)

## 2020-11-09 ENCOUNTER — Encounter: Payer: Self-pay | Admitting: Hematology and Oncology

## 2020-11-09 NOTE — Progress Notes (Signed)
[place] [phone]   [phone]  PROGRESS NOTE  Patient Care Team: [name], MD as PCP - General (Family Medicine) Deter[place], Jeneen Rinks, MD (Nephrology)  Hematological/Oncological History # Smoldering Multiple Myeloma, Low Risk  1) 12/30/2019: SPEP showed M protein 0.5, Kappa 4.4, Lambda 6.19, ratio 0.71. 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  2) 05/31/2020: SPEP ordered showed an M spike of 1.2 3) 05/31/2020: BmBx showed 11/% plasma cells with kappa restriction. Ordered due to concern for PTLD 4) 06/10/2020: establish care with Dr. Lorenso Courier 5) 08/01/2020: NM PET CT showed no evidence of lytic lesions  Interval History:  April [name] 64 y.o. female with medical history significant for smoldering multiple myeloma who presents for a follow up visit. The patient's last visit was on 08/05/2020. In the interim since the last visit the patient had a hospitalization from 12/1-12/02/2020 for fever, nausea, and dyspnea.   On exam today April [name] reports that she had a hospitalization at the beginning of December due to fluid overload.  She noted that this became apparent to her because she continued getting short of breath and nauseated.  She notes that she feels pretty good today as she was able to get a lot of fluid off of herself during that hospitalization.  She does have a retinal hemorrhage in her right eye which she notes is secondary to her trying to work an eyelash out of her eye last week.  She also has continued bruising on her hands.  She does endorse breathing better than compared to her prior visit.  She has had a bout of diverticulitis but that is not currently active.  She currently denies having any fevers, chills, sweats, nausea vomiting or diarrhea.  Her shortness of breath is at baseline.  A full 10 point ROS is listed below.  MEDICAL HISTORY:  Past Medical History:  Diagnosis Date   Arthritis    Asthma    CHF (congestive heart failure) (HCC)     COPD (chronic obstructive pulmonary disease) ([place])    Depression    Essential hypertension 02/08/2020   GERD (gastroesophageal reflux disease)    Hyperlipidemia    Hyperparathyroidism    Polycystic kidney    PONV (postoperative nausea and vomiting)     SURGICAL HISTORY: Past Surgical History:  Procedure Laterality Date   AV FISTULA PLACEMENT  10-21-2010   left Brachiocephalic AVF   BILATERAL OOPHORECTOMY     BIOPSY  05/19/2020   Procedure: BIOPSY;  Surgeon: Carol Ada, MD;  Location: [place];  Service: Endoscopy;;   CARPAL TUNNEL RELEASE  2000   CESAREAN SECTION     COLONOSCOPY WITH PROPOFOL N/A 05/19/2020   Procedure: COLONOSCOPY WITH PROPOFOL;  Surgeon: Carol Ada, MD;  Location: [facility];  Service: Endoscopy;  Laterality: N/A;   ENTEROSCOPY N/A 05/19/2020   Procedure: ENTEROSCOPY;  Surgeon: Carol Ada, MD;  Location: [facility];  Service: Endoscopy;  Laterality: N/A;   INSERTION OF DIALYSIS CATHETER  03/31/2012   Procedure: INSERTION OF DIALYSIS CATHETER;  Surgeon: Angelia Mould, MD;  Location: [facility];  Service: Vascular;  Laterality: N/A;  insertion of dialysis catheter right internal jugular vein   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   ORIF TIBIA FRACTURE Left 12/23/2018   Procedure: OPEN REDUCTION INTERNAL FIXATION (ORIF) TIBIA FRACTURE;  Surgeon: Shona Needles, MD;  Location: [facility];  Service: Orthopedics;  Laterality: Left;   ORIF TIBIA PLATEAU Right 12/23/2018   Procedure: OPEN REDUCTION INTERNAL FIXATION (ORIF) TIBIAL PLATEAU;  Surgeon: Shona Needles, MD;  Location: [place];  Service: Orthopedics;  Laterality: Right;   ORIF WRIST FRACTURE Left 12/08/2019   Procedure: OPEN REDUCTION INTERNAL FIXATION (ORIF) WRIST FRACTURE, REPAIR LACERATION LEFT FOREARM;  Surgeon: Dorna Leitz, MD;  Location: [facility];  Service: Orthopedics;  Laterality: Left;   TONSILLECTOMY  1967   TOTAL HIP ARTHROPLASTY Left 12/08/2019   Procedure: TOTAL HIP  ARTHROPLASTY ANTERIOR APPROACH;  Surgeon: Dorna Leitz, MD;  Location: [facility];  Service: Orthopedics;  Laterality: Left;   TUBAL LIGATION  2010    SOCIAL HISTORY: Social History   Socioeconomic History   Marital status: Married    Spouse name: Not on file   Number of children: Not on file   Years of education: Not on file   Highest education level: Not on file  Occupational History   Occupation: retired  Tobacco Use   Smoking status: Former Smoker    Packs/day: 0.25    Years: 15.00    Pack years: 3.75    Types: Cigarettes    Quit date: 03/22/1998    Years since quitting: 22.6   Smokeless tobacco: Never Used  Vaping Use   Vaping Use: Never used  Substance and Sexual Activity   Alcohol use: No   Drug use: No   Sexual activity: Not on file  Other Topics Concern   Not on file  Social History Narrative   Married, lives with spouse, son and mother   2 children > son and daughter   OCCUPATION: retired Education officer, museum   Social Determinants of Radio broadcast assistant Strain: Not on Art therapist Insecurity: Not on file  Transportation Needs: Not on file  Physical Activity: Not on file  Stress: Not on file  Social Connections: Not on file  Intimate Partner Violence: Not on file    FAMILY HISTORY: Family History  Problem Relation Age of Onset   Hypertension Mother    Heart disease Father        CABG history   Polycystic kidney disease Father    Polycystic kidney disease Brother     ALLERGIES:  is allergic to infed [iron dextran], pentamidine, erythromycin [erythromycin], iohexol, oxycodone, erythromycin, and ultram [tramadol hcl].  MEDICATIONS:  Current Outpatient Medications  Medication Sig Dispense Refill   acetaminophen (TYLENOL) 325 MG tablet Take 2 tablets (650 mg total) by mouth every 6 (six) hours as needed for mild pain (or Fever >/= 101).     albuterol (VENTOLIN HFA) 108 (90 Base) MCG/ACT inhaler Inhale 2 puffs into the lungs every 4  (four) hours as needed for wheezing or shortness of breath. 18 g 0   allopurinol (ZYLOPRIM) 300 MG tablet Take 300 mg by mouth as needed (gout).     amoxicillin (AMOXIL) 500 MG capsule Take 1,000 mg by mouth 2 (two) times daily.     ascorbic acid (VITAMIN C) 500 MG tablet Take 500 mg by mouth daily.     brimonidine (ALPHAGAN P) 0.1 % SOLN Place 1 drop into both eyes 2 (two) times daily.      Budeson-Glycopyrrol-Formoterol (BREZTRI AEROSPHERE) 160-9-4.8 MCG/ACT AERO Inhale 2 puffs into the lungs 2 (two) times daily. 10.9 g 5   cholecalciferol (VITAMIN D3) 25 MCG (1000 UNIT) tablet Take 1 tablet (1,000 Units total) by mouth daily. 30 tablet 0   cyanocobalamin 1000 MCG tablet Take 1,000 mcg by mouth daily.     cycloSPORINE (RESTASIS) 0.05 % ophthalmic emulsion Place 1 drop into both eyes 2 (two)  times daily. 0.4 mL 0   dorzolamide (TRUSOPT) 2 % ophthalmic solution Place 1 drop into both eyes daily.     ferrous sulfate 325 (65 FE) MG tablet Take 325 mg by mouth daily with breakfast.     FLUoxetine (PROZAC) 40 MG capsule Take 1 capsule (40 mg total) by mouth daily. 30 capsule 3   furosemide (LASIX) 80 MG tablet Take 80 mg by mouth daily.     gabapentin (NEURONTIN) 100 MG capsule Take 200 mg by mouth 3 (three) times daily.     guaiFENesin (MUCINEX) 600 MG 12 hr tablet Take 600 mg by mouth 2 (two) times daily.     ipratropium-albuterol (DUONEB) 0.5-2.5 (3) MG/3ML SOLN Take 3 mLs by nebulization every 6 (six) hours as needed (sob/wheezing).      isosorbide mononitrate (IMDUR) 60 MG 24 hr tablet Take 1 tablet (60 mg total) by mouth daily.     levalbuterol (XOPENEX) 0.63 MG/3ML nebulizer solution Take 0.63 mg by nebulization every 6 (six) hours as needed for wheezing or shortness of breath. Inhale 0.78m once daily.  May take 0.642mevery six hours as needed COPD.     magnesium oxide (MAG-OX) 400 MG tablet Take 1 tablet (400 mg total) by mouth 2 (two) times daily. 60 tablet 0    mycophenolate (MYFORTIC) 180 MG EC tablet Take 180 mg by mouth 2 (two) times daily.     pantoprazole (PROTONIX) 40 MG tablet Take 1 tablet (40 mg total) by mouth 2 (two) times daily. 60 tablet 0   predniSONE (DELTASONE) 5 MG tablet Take 7.5 mg by mouth daily with breakfast.     PROGRAF 1 MG capsule Take 1 mg by mouth 2 (two) times daily.      promethazine (PHENERGAN) 25 MG tablet Take 1 tablet (25 mg total) by mouth every 8 (eight) hours as needed for up to 15 doses for nausea or vomiting. 15 tablet 0   TRAVATAN Z 0.004 % SOLN ophthalmic solution Place 1 drop into both eyes at bedtime.     valACYclovir (VALTREX) 500 MG tablet Take 500 mg by mouth 2 (two) times daily.     verapamil (CALAN-SR) 240 MG CR tablet Take 240 mg by mouth daily.     No current facility-administered medications for this visit.    REVIEW OF SYSTEMS:   Constitutional: ( - ) fevers, ( - )  chills , ( - ) night sweats Eyes: ( - ) blurriness of vision, ( - ) double vision, ( - ) watery eyes Ears, nose, mouth, throat, and face: ( - ) mucositis, ( - ) sore throat Respiratory: ( - ) cough, ( - ) dyspnea, ( - ) wheezes Cardiovascular: ( - ) palpitation, ( - ) chest discomfort, ( - ) lower extremity swelling Gastrointestinal:  ( - ) nausea, ( - ) heartburn, ( - ) change in bowel habits Skin: ( - ) abnormal skin rashes Lymphatics: ( - ) new lymphadenopathy, ( - ) easy bruising Neurological: ( - ) numbness, ( - ) tingling, ( - ) new weaknesses Behavioral/Psych: ( - ) mood change, ( - ) new changes  All other systems were reviewed with the patient and are negative.  PHYSICAL EXAMINATION:3 ECOG PERFORMANCE STATUS: 3 - Symptomatic, >50% confined to bed  Vitals:   11/04/20 1020  BP: (!) 151/65  Pulse: 76  Resp: 16  Temp: 97.6 F (36.4 C)  SpO2: 97%   Filed Weights   11/04/20 1020  Weight: 160 lb  14.4 oz (73 kg)    GENERAL: chronically ill appearing elderly Caucasian female. alert, no distress and  comfortable SKIN: skin color, texture, turgor are normal, no rashes or significant lesions. Bruising on hands/arms bilaterally.  EYES: conjunctiva are pink and non-injected, sclera clear LUNGS: clear to auscultation and percussion with normal breathing effort HEART: regular rate & rhythm and no murmurs and no lower extremity edema Musculoskeletal: no cyanosis of digits and no clubbing  PSYCH: alert & oriented x 3, fluent speech NEURO: no focal motor/sensory deficits  LABORATORY DATA:  I have reviewed the data as listed CBC Latest Ref Rng & Units 11/04/2020 10/26/2020 10/25/2020  WBC 4.0 - 10.5 K/uL 6.1 4.7 5.7  Hemoglobin 12.0 - 15.0 g/dL 9.0(L) 8.1(L) 8.4(L)  Hematocrit 36.0 - 46.0 % 29.5(L) 26.9(L) 28.0(L)  Platelets 150 - 400 K/uL 241 107(L) 136(L)    CMP Latest Ref Rng & Units 11/04/2020 10/26/2020 10/25/2020  Glucose 70 - 99 mg/dL 118(H) 125(H) 150(H)  BUN 8 - 23 mg/dL 42(H) 74(H) 76(H)  Creatinine 0.44 - 1.00 mg/dL 1.28(H) 1.67(H) 1.65(H)  Sodium 135 - 145 mmol/L 139 129(L) 131(L)  Potassium 3.5 - 5.1 mmol/L 3.7 4.2 4.7  Chloride 98 - 111 mmol/L 105 96(L) 98  CO2 22 - 32 mmol/L _0 Calcium 8.9 - 10.3 mg/dL 9.9 8.4(L) 8.9  Total Protein 6.5 - 8.1 g/dL 7.4 6.2(L) 6.4(L)  Total Bilirubin 0.3 - 1.2 mg/dL 0.6 0.7 0.6  Alkaline Phos 38 - 126 U/L 81 50 49  AST 15 - 41 U/L 13(L) 12(L) 13(L)  ALT 0 - 44 U/L _1 Lab Results  Component Value Date   MPROTEIN 1.8 (H) 11/04/2020   MPROTEIN 1.6 (H) 08/05/2020   MPROTEIN 1.5 (H) 06/10/2020   Lab Results  Component Value Date   KPAFRELGTCHN 24.2 (H) 11/04/2020   KPAFRELGTCHN 25.8 (H) 08/05/2020   KPAFRELGTCHN 20.3 (H) 06/10/2020   LAMBDASER 15.8 11/04/2020   LAMBDASER 20.6 08/05/2020   LAMBDASER 21.1 06/10/2020   KAPLAMBRATIO 1.53 11/04/2020   KAPLAMBRATIO 1.25 08/05/2020   KAPLAMBRATIO 4.36 06/17/2020    RADIOGRAPHIC STUDIES: CT ABDOMEN PELVIS WO CONTRAST  Result Date: 10/24/2020 CLINICAL DATA:  Abdominal pain.  EXAM: CT ABDOMEN AND PELVIS WITHOUT CONTRAST TECHNIQUE: Multidetector CT imaging of the abdomen and pelvis was performed following the standard protocol without IV contrast. COMPARISON:  09/04/2020 FINDINGS: Lower chest: Mild cardiomegaly. Small effusions. Lung base opacities likely atelectasis. Findings similar to the prior study. Hepatobiliary: Liver normal in size and overall attenuation. There are multiple circumscribed low-density masses consistent with numerous cysts. An area less well-defined hypoattenuation lies in the left lobe adjacent to the falciform ligament, similar to the prior study, which may reflect focal fat infiltration. Subtle small density suggested in the dependent lower gallbladder segment which may reflect a stone. Gallbladder otherwise unremarkable. No bile duct dilation. Pancreas: Unremarkable. No pancreatic ductal dilatation or surrounding inflammatory changes. Spleen: Enlarged spleen, 14 cm in greatest dimension, unchanged. No splenic mass. Adrenals/Urinary Tract: No adrenal masses. Numerous bilateral renal masses, the majority of which are hypoattenuating, with areas of thin intervening renal parenchyma. Overall diffuse parenchymal thinning. Findings are stable from the prior exam consist polycystic kidney disease. No convincing collecting system stones. No hydronephrosis. Normal ureters. Bladder mildly distended and partly obscured by left hip arthroplasty artifact. Bladder otherwise unremarkable. Right pelvic renal transplant kidney is normal in size. No mass or hydronephrosis and no change. Stomach/Bowel: Stomach is within normal limits. Appendix appears normal.  No evidence of bowel wall thickening, distention, or inflammatory changes. Vascular/Lymphatic: Aortic atherosclerosis. No enlarged lymph nodes. Reproductive: Unremarkable. Other: Small fat containing paraumbilical hernia. Oval area of soft tissue attenuation along the left inguinal canal, which may reflect localized fluid,  but is nonspecific. It is unchanged from the prior CT. Musculoskeletal: Old right rib fractures. No acute fractures. No osteoblastic or osteolytic lesions. IMPRESSION: 1. No acute findings within the abdomen or pelvis. 2. Lung bases demonstrate mild cardiomegaly, small effusions and lung base opacities consistent with atelectasis, without significant change from the prior CT. 3. Findings of polycystic kidney disease including multiple liver cysts, also unchanged. 4. Mild stable splenomegaly. 5. Possible gallstone.  No acute cholecystitis. 6. Normal appearance of the right pelvic renal transplant. 7. Aortic atherosclerosis. Small fat containing paraumbilical hernia. Electronically Signed   By: Lajean Manes M.D.   On: 10/24/2020 15:36   DG Chest 1 View  Result Date: 10/23/2020 CLINICAL DATA:  Shortness of breath, fever. EXAM: CHEST  1 VIEW COMPARISON:  October 06, 2020. FINDINGS: Stable cardiomediastinal silhouette. Hypoinflation of the lungs is noted with minimal bibasilar subsegmental atelectasis. No pneumothorax or pleural effusion is noted. Bony thorax is unremarkable. IMPRESSION: Hypoinflation of the lungs with minimal bibasilar subsegmental atelectasis. Electronically Signed   By: Marijo Conception M.D.   On: 10/23/2020 12:43   DG Abd 1 View  Result Date: 10/23/2020 CLINICAL DATA:  Weakness and fevers EXAM: ABDOMEN - 1 VIEW COMPARISON:  04/19/2019 FINDINGS: Scattered large and small bowel gas is noted. No abnormal mass or abnormal calcifications are seen. Left hip replacement is now noted. Mild degenerative changes of lumbar spine are seen. IMPRESSION: No acute abnormality noted. Electronically Signed   By: Inez Catalina M.D.   On: 10/23/2020 19:09   NM Pulmonary Perfusion  Result Date: 10/24/2020 CLINICAL DATA:  Shortness of breath for several days. EXAM: NUCLEAR MEDICINE PERFUSION LUNG SCAN TECHNIQUE: Perfusion images were obtained in multiple projections after intravenous injection of  radiopharmaceutical. Ventilation scans intentionally deferred if perfusion scan and chest x-ray adequate for interpretation during COVID 19 epidemic. RADIOPHARMACEUTICALS:  4.4 mCi Tc-37m MAA IV COMPARISON:  Chest x-ray 10/23/2020. FINDINGS: No evidence for wedge-shaped peripheral perfusion defect in either lung. IMPRESSION: Normal perfusion study. Electronically Signed   By: Misty Stanley M.D.   On: 10/24/2020 12:13   US RENAL  Result Date: 10/25/2020 CLINICAL DATA:  Acute kidney injury EXAM: RENAL / URINARY TRACT ULTRASOUND COMPLETE COMPARISON:  October 25, 2019 FINDINGS: Right Kidney: Renal measurements: 15.1 x 8.5 x 9.3 cm = volume: 622 mL. Innumerable cysts are noted throughout the right kidney. Left Kidney: Renal measurements: 13.9 x 5.9 x 8 cm = volume: 340 mL. Innumerable cysts are noted throughout the left kidney. There is a right lower quadrant pelvic transplant kidney without evidence for hydronephrosis. Bladder: Appears normal for degree of bladder distention. Other: None. IMPRESSION: 1. Findings consistent with polycystic kidney disease. 2. Unremarkable appearance of the right pelvic transplant kidney. Electronically Signed   By: Constance Holster M.D.   On: 10/25/2020 22:38   DG CHEST PORT 1 VIEW  Result Date: 10/25/2020 CLINICAL DATA:  Shortness of breath. EXAM: PORTABLE CHEST 1 VIEW COMPARISON:  Chest x-ray 10/23/2020.  Chest CT 05/14/2020. FINDINGS: Mediastinum and hilar structures are stable. Mild cardiomegaly and pulmonary venous congestion. Progressive bibasilar atelectasis. Mild left base infiltrate cannot be excluded. Small bilateral pleural effusions noted. Stable bibasilar pleural-parenchymal thickening consistent scarring. No pneumothorax. IMPRESSION: 1. Mild cardiomegaly and pulmonary venous congestion. 2.  Progressive bibasilar atelectasis. Mild left base infiltrate cannot be excluded. Small bilateral pleural effusions noted. Electronically Signed   By: Marcello Moores  Register   On:  10/25/2020 06:56    ASSESSMENT & PLAN April [name] 64 y.o. female with medical history significant for smoldering multiple myeloma who presents for a follow up visit.  After review the labs, review the records, discussion with the patient the findings are most consistent with a low risk smoldering multiple myeloma.  Based on the 20-2-20 criteria the patient does not require treatment for this smoldering myeloma.  Certainly a confounder in this case is the low hemoglobin.  Several the labs have pointed towards low endogenous erythropoietin as the likely cause of this and not the plasma cell dyscrasia.  As such I do not believe that this low hemoglobin is attributable to the plasma cell population and therefore she does not meet crab criteria at this time.  I would recommend continued observation at least every every 6 months. The patient voiced her understanding of these findings and the plan moving forward.  # Smoldering Multiple Myeloma, Low Risk  --PET CT scan is reassuring for no lytic lesions. Based on the 20-2-20 criteria the patient does not require treatment for her smoldering myeloma --the low Hgb and elevated MCV are certainly a confounder in this situation. Given the relative low bone marrow presence and lack of other criteria I strongly suspect this is due to low endogenous EPO from her transplant rather than from the plasma cells themselves --assure the patient has q 12 month PET CT scans to monitor for lytic lesions.  --recommend q 6 month clinic visits and labs to monitor   No orders of the defined types were placed in this encounter.   All questions were answered. The patient knows to call the clinic with any problems, questions or concerns.  A total of more than 30 minutes were spent on this encounter and over half of that time was spent on counseling and coordination of care as outlined above.   Ledell Peoples, MD Department of Hematology/Oncology [place] at [facility] Phone: [phone] Pager: [phone] Email: [email or web address]  11/09/2020 4:46 PM

## 2020-11-11 ENCOUNTER — Telehealth: Payer: Self-pay | Admitting: Pulmonary Disease

## 2020-11-11 ENCOUNTER — Telehealth: Payer: Self-pay | Admitting: *Deleted

## 2020-11-11 ENCOUNTER — Telehealth: Payer: Self-pay | Admitting: Hematology and Oncology

## 2020-11-11 MED ORDER — PREDNISONE 20 MG PO TABS: 40.0000 mg | ORAL_TABLET | Freq: Every day | ORAL | 0 refills | Status: DC

## 2020-11-11 NOTE — Telephone Encounter (Signed)
TCT patient regarding lab results from last week. No answer but was able to leave detailed message on her identified phone. Advised that her smoldering multiple myeloma labs are stable. Advised that we will see her back in 6 months time. Advised that she can call back [phone] if she has any questions.

## 2020-11-11 NOTE — Telephone Encounter (Signed)
Spoke with the pt's spouse  She states that she has been having chest tightness and increased SOB x 4 days  Chest tightness worsens with any exertion  She states has been having to wear her o2 over the past 2 days b/c sats dropped into mid 80's on RA--today 96% on 2lpm o2  She states feels very fatigued  Has minimal cough with clear sputum  She denies any f/c/s, body aches  Has had covid vaccines  She states that she is still on the breztri and pred 7.5 mg daily  Please advise thanks!

## 2020-11-11 NOTE — Telephone Encounter (Signed)
Please let her know that we are working on an appeal for her biologic medications for asthma Oral prednisone 40 mg a day for 5 days.    Call back if there is no improvement in symptoms.

## 2020-11-11 NOTE — Telephone Encounter (Signed)
-----   Message from Orson Slick, MD sent at 11/07/2020  4:53 PM EST ----- Please let April [name] know that her smoldering myeloma labs are stable from prior. We will see her back in 6 months time.   ----- Message ----- From: Buel Ream, Lab In [place] Sent: 11/04/2020  10:07 AM EST To: Orson Slick, MD

## 2020-11-11 NOTE — Telephone Encounter (Signed)
Spoke with the pt and notified of response per Dr Vaughan Browner. She verbalized understanding. Rx for pred has been sent the her preferred pharm.

## 2020-11-11 NOTE — Telephone Encounter (Signed)
Scheduled appt per 12/18 sch msg - pt is aware of apt added on to schedule.

## 2020-11-12 NOTE — Telephone Encounter (Signed)
Received letter dated November 08, 2020 Berna Bue approved by [employer] after appeal  Authorization number [id number] for Berna Bue pen valid through May 08, 2021  We will arrange in getting her started on therapy.  Marshell Garfinkel MD [facility] 11/12/2020, 12:52 PM

## 2020-11-12 NOTE — Telephone Encounter (Signed)
Ran test claim, patient's copay for 1st dose is $65.00.  Called and discussed with patient, she would like to apply for [employer] patient assistance. Patient will sign application electronically and will get MD signature on application to submit. Will follow up.

## 2020-11-13 ENCOUNTER — Other Ambulatory Visit: Payer: Self-pay

## 2020-11-13 ENCOUNTER — Encounter (HOSPITAL_COMMUNITY): Payer: Self-pay | Admitting: Emergency Medicine

## 2020-11-13 ENCOUNTER — Emergency Department (HOSPITAL_COMMUNITY): Payer: Medicare Other

## 2020-11-13 ENCOUNTER — Emergency Department (HOSPITAL_COMMUNITY)
Admission: EM | Admit: 2020-11-13 | Discharge: 2020-11-13 | Disposition: A | Discharge status: Home / Self Care | Payer: Medicare Other | Attending: Emergency Medicine | Admitting: Emergency Medicine

## 2020-11-13 DIAGNOSIS — J449 Chronic obstructive pulmonary disease, unspecified: Secondary | ICD-10-CM | POA: Insufficient documentation

## 2020-11-13 DIAGNOSIS — Z7951 Long term (current) use of inhaled steroids: Secondary | ICD-10-CM | POA: Diagnosis not present

## 2020-11-13 DIAGNOSIS — N186 End stage renal disease: Secondary | ICD-10-CM | POA: Diagnosis not present

## 2020-11-13 DIAGNOSIS — I5033 Acute on chronic diastolic (congestive) heart failure: Secondary | ICD-10-CM | POA: Insufficient documentation

## 2020-11-13 DIAGNOSIS — Z96642 Presence of left artificial hip joint: Secondary | ICD-10-CM | POA: Diagnosis not present

## 2020-11-13 DIAGNOSIS — Z87891 Personal history of nicotine dependence: Secondary | ICD-10-CM | POA: Insufficient documentation

## 2020-11-13 DIAGNOSIS — Z79899 Other long term (current) drug therapy: Secondary | ICD-10-CM | POA: Diagnosis not present

## 2020-11-13 DIAGNOSIS — R69 Illness, unspecified: Secondary | ICD-10-CM

## 2020-11-13 DIAGNOSIS — R609 Edema, unspecified: Secondary | ICD-10-CM | POA: Diagnosis not present

## 2020-11-13 DIAGNOSIS — I132 Hypertensive heart and chronic kidney disease with heart failure and with stage 5 chronic kidney disease, or end stage renal disease: Secondary | ICD-10-CM | POA: Diagnosis not present

## 2020-11-13 DIAGNOSIS — R0602 Shortness of breath: Secondary | ICD-10-CM | POA: Diagnosis present

## 2020-11-13 LAB — COMPREHENSIVE METABOLIC PANEL
ALT: 12 U/L (ref 0–44)
AST: 16 U/L (ref 15–41)
Albumin: 3.2 g/dL — ABNORMAL LOW (ref 3.5–5.0)
Alkaline Phosphatase: 70 U/L (ref 38–126)
Anion gap: 9 (ref 5–15)
BUN: 51 mg/dL — ABNORMAL HIGH (ref 8–23)
CO2: 28 mmol/L (ref 22–32)
Calcium: 9.9 mg/dL (ref 8.9–10.3)
Chloride: 100 mmol/L (ref 98–111)
Creatinine, Ser: 1.3 mg/dL — ABNORMAL HIGH (ref 0.44–1.00)
GFR, Estimated: 46 mL/min — ABNORMAL LOW (ref >60)
Glucose, Bld: 184 mg/dL — ABNORMAL HIGH (ref 70–99)
Potassium: 4.5 mmol/L (ref 3.5–5.1)
Sodium: 137 mmol/L (ref 135–145)
Total Bilirubin: 0.7 mg/dL (ref 0.3–1.2)
Total Protein: 8.2 g/dL — ABNORMAL HIGH (ref 6.5–8.1)

## 2020-11-13 LAB — MAGNESIUM: Magnesium: 2.1 mg/dL (ref 1.7–2.4)

## 2020-11-13 LAB — CBC
HCT: 31 % — ABNORMAL LOW (ref 36.0–46.0)
Hemoglobin: 9.2 g/dL — ABNORMAL LOW (ref 12.0–15.0)
MCH: 31.3 pg (ref 26.0–34.0)
MCHC: 29.7 g/dL — ABNORMAL LOW (ref 30.0–36.0)
MCV: 105.4 fL — ABNORMAL HIGH (ref 80.0–100.0)
Platelets: 176 10*3/uL (ref 150–400)
RBC: 2.94 MIL/uL — ABNORMAL LOW (ref 3.87–5.11)
RDW: 19.2 % — ABNORMAL HIGH (ref 11.5–15.5)
WBC: 9.9 10*3/uL (ref 4.0–10.5)
nRBC: 0 % (ref 0.0–0.2)

## 2020-11-13 LAB — URINALYSIS, ROUTINE W REFLEX MICROSCOPIC
Bilirubin Urine: NEGATIVE
Glucose, UA: NEGATIVE mg/dL
Hgb urine dipstick: NEGATIVE
Ketones, ur: NEGATIVE mg/dL
Leukocytes,Ua: NEGATIVE
Nitrite: NEGATIVE
Protein, ur: NEGATIVE mg/dL
Specific Gravity, Urine: 1.009 (ref 1.005–1.030)
pH: 5 (ref 5.0–8.0)

## 2020-11-13 LAB — TROPONIN I (HIGH SENSITIVITY): Troponin I (High Sensitivity): 5 ng/L (ref <18)

## 2020-11-13 LAB — BRAIN NATRIURETIC PEPTIDE: B Natriuretic Peptide: 540 pg/mL — ABNORMAL HIGH (ref 0.0–100.0)

## 2020-11-13 LAB — PHOSPHORUS: Phosphorus: 2.8 mg/dL (ref 2.5–4.6)

## 2020-11-13 NOTE — ED Notes (Signed)
ED Provider at bedside. 

## 2020-11-13 NOTE — ED Triage Notes (Signed)
Patient states her PCP told her to come to the ED D/T 'fluid overload.' Patient has bilateral leg swelling and it progressing up to her knees.

## 2020-11-13 NOTE — ED Provider Notes (Signed)
[facility] DEPT Provider Note   CSN: [id number] Arrival date & time: 11/13/20  1103     History Chief Complaint  Patient presents with  . fluid overload  . Leg Swelling    April [name] is a 64 y.o. female.  HPI Patient has history of kidney transplant.  She was recently hospitalized and discharged 12\4\2021 with heart failure and acute on chronic respiratory failure.  Patient reports he takes Lasix 80 mg daily.  She reports since her discharge, she has now become increasingly short of breath again.  She cannot lie flat.  She estimates she has gained about 10 pounds of weight.  She is taking the Lasix as directed.  No vomiting.  No diarrhea.  Patient reports her home health provider communicated with her doctor at [facility] and she was advised to come to the emergency department for concerns of fluid overload.  Patient reports she continues to make urine.  She was recently put on prednisone for her shortness of breath by her pulmonologist.  She reports she took a dose of 40 mg yesterday.  Reports her legs are quite swollen.  He is limited in her mobility.  She can get around a little bit in her house with a walker.    Past Medical History:  Diagnosis Date  . Arthritis   . Asthma   . CHF (congestive heart failure) ([place])   . COPD (chronic obstructive pulmonary disease) ([place])   . Depression   . Essential hypertension 02/08/2020  . GERD (gastroesophageal reflux disease)   . Hyperlipidemia   . Hyperparathyroidism   . Polycystic kidney   . PONV (postoperative nausea and vomiting)     Patient Active Problem List   Diagnosis Date Noted  . Fever of unknown origin 10/24/2020  . Fever of unknown origin (FUO) 10/23/2020  . Lower GI bleeding 09/25/2020  . Diarrhea 09/10/2020  . Acute renal failure superimposed on stage 3b chronic kidney disease ([place]) 09/08/2020  . Diverticulitis 09/04/2020  . SIRS (systemic inflammatory response syndrome) ([place])  07/02/2020  . Chronic kidney disease, stage 3a ([place])   . 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 History of immunosuppressive therapy 05/27/2020  . FUO (fever of unknown origin) 05/15/2020  . Immunocompromised state ([place])   . Decubitus ulcer of coccyx, unstageable ([place]) 05/01/2020  . Pericardial effusion 04/16/2020  . Chronic respiratory failure with hypoxia ([place]) 04/16/2020  . Proteinuria 04/16/2020  . Pressure injury of skin 04/08/2020  . COPD (chronic obstructive pulmonary disease) ([place]) 04/07/2020  . SVT (supraventricular tachycardia) ([place])   . Anemia of chronic disease   . Palliative care encounter   . Goals of care, counseling/discussion   . Essential hypertension 02/08/2020  . Glaucoma 02/08/2020  . Sepsis with encephalopathy without septic shock ([place]) 01/31/2020  . Fracture of left superior pubic ramus (H[facility])   . Pain   . Anxiety state   . Left displaced femoral neck fracture ([place]) 12/15/2019  . Status post total hip replacement, left   . Post-op pain   . Polycystic kidney   . Closed left hip fracture ([place]) 12/07/2019  . Left wrist fracture 12/07/2019  . Medication management 09/22/2019  . Physical deconditioning 09/22/2019  . Acute respiratory failure with hypoxia ([place]) 04/19/2019  . Acute on chronic diastolic CHF (congestive heart failure) ([place]) 04/19/2019  . Bilateral closed proximal tibial fracture 12/21/2018  . Asthma, chronic, unspecified asthma severity, with acute exacerbation 10/03/2018  . Chronic diastolic CHF (congestive heart failure) (  [place]) 10/01/2017  . Asthma, mild intermittent 07/15/2016  . GERD without esophagitis 07/15/2016  . Renal transplant recipient 07/15/2016  . Hyperlipidemia 07/15/2016  . Depression 07/15/2016  . Bronchitis, mucopurulent recurrent ([place]) 08/08/2014  . Chronic cough 07/24/2014  . End stage renal disease ([place]) 03/23/2012  . 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, implant, and graft 03/23/2012    Past Surgical  History:  Procedure Laterality Date  . 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 BILATERAL OOPHORECTOMY    . BIOPSY  05/19/2020   Procedure: BIOPSY;  Surgeon: Carol Ada, MD;  Location: [facility] ENDOSCOPY;  Service: Endoscopy;;  . CARPAL TUNNEL RELEASE  2000  . CESAREAN SECTION    . COLONOSCOPY WITH PROPOFOL N/A 05/19/2020   Procedure: COLONOSCOPY WITH PROPOFOL;  Surgeon: Carol Ada, MD;  Location: [facility];  Service: Endoscopy;  Laterality: N/A;  . ENTEROSCOPY N/A 05/19/2020   Procedure: ENTEROSCOPY;  Surgeon: Carol Ada, MD;  Location: [facility];  Service: Endoscopy;  Laterality: N/A;  . INSERTION OF DIALYSIS CATHETER  03/31/2012   Procedure: INSERTION OF DIALYSIS CATHETER;  Surgeon: Angelia Mould, MD;  Location: [facility];  Service: Vascular;  Laterality: N/A;  insertion of dialysis catheter right internal jugular vein  . 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 ORIF TIBIA FRACTURE Left 12/23/2018   Procedure: OPEN REDUCTION INTERNAL FIXATION (ORIF) TIBIA FRACTURE;  Surgeon: Shona Needles, MD;  Location: [facility];  Service: Orthopedics;  Laterality: Left;  . ORIF TIBIA PLATEAU Right 12/23/2018   Procedure: OPEN REDUCTION INTERNAL FIXATION (ORIF) TIBIAL PLATEAU;  Surgeon: Shona Needles, MD;  Location: [facility];  Service: Orthopedics;  Laterality: Right;  . ORIF WRIST FRACTURE Left 12/08/2019   Procedure: OPEN REDUCTION INTERNAL FIXATION (ORIF) WRIST FRACTURE, REPAIR LACERATION LEFT FOREARM;  Surgeon: Dorna Leitz, MD;  Location: [facility];  Service: Orthopedics;  Laterality: Left;  . TONSILLECTOMY  1967  . TOTAL HIP ARTHROPLASTY Left 12/08/2019   Procedure: TOTAL HIP ARTHROPLASTY ANTERIOR APPROACH;  Surgeon: Dorna Leitz, MD;  Location: [facility];  Service: Orthopedics;  Laterality: Left;  . TUBAL LIGATION  2010     OB History   No obstetric history on file.     Family History  Problem Relation Age of Onset  . Hypertension Mother   . Heart disease Father         CABG history  . Polycystic kidney disease Father   . Polycystic kidney disease Brother     Social History   Tobacco Use  . Smoking status: Former Smoker    Packs/day: 0.25    Years: 15.00    Pack years: 3.75    Types: Cigarettes    Quit date: 03/22/1998    Years since quitting: 22.6  . Smokeless tobacco: Never Used  Vaping Use  . Vaping Use: Never used  Substance Use Topics  . Alcohol use: No  . Drug use: No    Home Medications Prior to Admission medications   Medication Sig Start Date End Date Taking? Authorizing Provider  acetaminophen (TYLENOL) 325 MG tablet Take 2 tablets (650 mg total) by mouth every 6 (six) hours as needed for mild pain (or Fever >/= 101). 05/22/20   Elgergawy, Silver Huguenin, MD  albuterol (VENTOLIN HFA) 108 (90 Base) MCG/ACT inhaler Inhale 2 puffs into the lungs every 4 (four) hours as needed for wheezing or shortness of breath. 01/04/20   Angiulli, Lavon Paganini, PA-C  allopurinol (ZYLOPRIM) 300 MG tablet Take  300 mg by mouth as needed (gout).    [provider]  amoxicillin (AMOXIL) 500 MG capsule Take 1,000 mg by mouth 2 (two) times daily. 11/01/20   [provider]  ascorbic acid (VITAMIN C) 500 MG tablet Take 500 mg by mouth daily.    [provider]  brimonidine (ALPHAGAN P) 0.1 % SOLN Place 1 drop into both eyes 2 (two) times daily.     [provider]  Budeson-Glycopyrrol-Formoterol (BREZTRI AEROSPHERE) 160-9-4.8 MCG/ACT AERO Inhale 2 puffs into the lungs 2 (two) times daily. 08/20/20   Martyn Ehrich, NP  cholecalciferol (VITAMIN D3) 25 MCG (1000 UNIT) tablet Take 1 tablet (1,000 Units total) by mouth daily. 01/04/20   Angiulli, Lavon Paganini, PA-C  cyanocobalamin 1000 MCG tablet Take 1,000 mcg by mouth daily.    [provider]  cycloSPORINE (RESTASIS) 0.05 % ophthalmic emulsion Place 1 drop into both eyes 2 (two) times daily. 01/04/20   Angiulli, Lavon Paganini, PA-C  dorzolamide (TRUSOPT) 2 % ophthalmic solution Place 1  drop into both eyes daily. 06/25/19   [provider]  ferrous sulfate 325 (65 FE) MG tablet Take 325 mg by mouth daily with breakfast.    [provider]  FLUoxetine (PROZAC) 40 MG capsule Take 1 capsule (40 mg total) by mouth daily. 01/04/20   Angiulli, Lavon Paganini, PA-C  furosemide (LASIX) 80 MG tablet Take 80 mg by mouth daily. 10/30/20   [provider]  gabapentin (NEURONTIN) 100 MG capsule Take 200 mg by mouth 3 (three) times daily.    [provider]  guaiFENesin (MUCINEX) 600 MG 12 hr tablet Take 600 mg by mouth 2 (two) times daily.    [provider]  ipratropium-albuterol (DUONEB) 0.5-2.5 (3) MG/3ML SOLN Take 3 mLs by nebulization every 6 (six) hours as needed (sob/wheezing).  08/24/20   [provider]  isosorbide mononitrate (IMDUR) 60 MG 24 hr tablet Take 1 tablet (60 mg total) by mouth daily. 05/23/20   Elgergawy, Silver Huguenin, MD  levalbuterol Penne Lash) 0.63 MG/3ML nebulizer solution Take 0.63 mg by nebulization every 6 (six) hours as needed for wheezing or shortness of breath. Inhale 0.63mg  once daily.  May take 0.63mg  every six hours as needed COPD.    [provider]  magnesium oxide (MAG-OX) 400 MG tablet Take 1 tablet (400 mg total) by mouth 2 (two) times daily. 01/04/20   Angiulli, Lavon Paganini, PA-C  mycophenolate (MYFORTIC) 180 MG EC tablet Take 180 mg by mouth 2 (two) times daily.    [provider]  pantoprazole (PROTONIX) 40 MG tablet Take 1 tablet (40 mg total) by mouth 2 (two) times daily. 01/04/20   Angiulli, Lavon Paganini, PA-C  predniSONE (DELTASONE) 20 MG tablet Take 2 tablets (40 mg total) by mouth daily with breakfast. 11/11/20   Mannam, Praveen, MD  predniSONE (DELTASONE) 5 MG tablet Take 7.5 mg by mouth daily with breakfast.    [provider]  PROGRAF 1 MG capsule Take 1 mg by mouth 2 (two) times daily.  03/22/19   [provider]  promethazine (PHENERGAN) 25 MG tablet Take 1 tablet (25 mg total) by  mouth every 8 (eight) hours as needed for up to 15 doses for nausea or vomiting. 09/02/20   Curatolo, Adam, DO  TRAVATAN Z 0.004 % SOLN ophthalmic solution Place 1 drop into both eyes at bedtime. 07/03/16   [provider]  valACYclovir (VALTREX) 500 MG tablet Take 500 mg by mouth 2 (two) times daily.  [provider]  verapamil (CALAN-SR) 240 MG CR tablet Take 240 mg by mouth daily.    [provider]    Allergies    Ardyth Harps [iron dextran], Pentamidine, Budesonide-formoterol fumarate, Bupropion, Crestor [rosuvastatin], Duloxetine hcl, Erythromycin [erythromycin], Iohexol, Oxycodone, Pravastatin sodium, Erythromycin, and Ultram [tramadol hcl]  Review of Systems   Review of Systems 10 systems reviewed and negative except as per HPI Physical Exam Updated Vital Signs BP 130/70   Pulse 75   Temp 98.1 F (36.7 C) (Oral)   Resp (!) 21   Ht 5\' 1"  (1.549 m)   Wt 75.8 kg   SpO2 99%   BMI 31.55 kg/m   Physical Exam Constitutional:      Comments: Alert and nontoxic.  Mild increased work of breathing at rest.  Speaking full sentences.  HENT:     Head: Normocephalic and atraumatic.     Mouth/Throat:     Pharynx: Oropharynx is clear.  Cardiovascular:     Rate and Rhythm: Normal rate and regular rhythm.  Pulmonary:     Comments: Tachypnea.  Crackles left lung base.  Right grossly clear Abdominal:     General: There is no distension.     Palpations: Abdomen is soft.     Tenderness: There is no abdominal tenderness. There is no guarding.  Musculoskeletal:     Comments: 2+ pitting edema bilateral feet and lower legs.  Skin:    General: Skin is warm and dry.  Neurological:     General: No focal deficit present.     Mental Status: She is oriented to person, place, and time.     Coordination: Coordination normal.  Psychiatric:        Mood and Affect: Mood normal.     ED Results / Procedures / Treatments   Labs (all labs ordered are listed, but only abnormal  results are displayed) Labs Reviewed  COMPREHENSIVE METABOLIC PANEL - Abnormal; Notable for the following components:      Result Value   Glucose, Bld 184 (*)    BUN 51 (*)    Creatinine, Ser 1.30 (*)    Total Protein 8.2 (*)    Albumin 3.2 (*)    GFR, Estimated 46 (*)    All other components within normal limits  CBC - Abnormal; Notable for the following components:   RBC 2.94 (*)    Hemoglobin 9.2 (*)    HCT 31.0 (*)    MCV 105.4 (*)    MCHC 29.7 (*)    RDW 19.2 (*)    All other components within normal limits  BRAIN NATRIURETIC PEPTIDE - Abnormal; Notable for the following components:   B Natriuretic Peptide 540.0 (*)    All other components within normal limits  URINALYSIS, ROUTINE W REFLEX MICROSCOPIC  MAGNESIUM  PHOSPHORUS  TROPONIN I (HIGH SENSITIVITY)    EKG EKG Interpretation  Date/Time:  Wednesday November 13 2020 12:53:44 EST Ventricular Rate:  79 PR Interval:    QRS Duration: 99 QT Interval:  409 QTC Calculation: 469 R Axis:   93 Text Interpretation: Sinus rhythm Right axis deviation RSR' in V1 or V2, probably normal variant no sig change from previous Confirmed by Charlesetta Shanks [phone]) on 11/13/2020 5:07:36 PM   Radiology DG Chest Port 1 View  Result Date: 11/13/2020 CLINICAL DATA:  Bilateral lower extremity swelling. History of congestive heart failure and COPD. EXAM: PORTABLE CHEST 1 VIEW COMPARISON:  Radiographs 10/25/2020 and 10/23/2020.  CT 05/14/2020. FINDINGS: 1421 hours. Stable low lung  volumes with mild chronic bibasilar atelectasis. The heart is enlarged. The pulmonary vascularity is normal, and there is no edema. There is mild blunting of the right costophrenic angle which could reflect a small right pleural effusion. No pneumothorax. The bones appear unremarkable. Telemetry leads overlie the chest. IMPRESSION: Stable cardiomegaly and possible small right pleural effusion. No edema. Mild bibasilar atelectasis. Electronically Signed   By: Richardean Sale M.D.   On: 11/13/2020 14:49    Procedures Procedures (including critical care time)  Medications Ordered in ED Medications - No data to display  ED Course  I have reviewed the triage vital signs and the nursing notes.  Pertinent labs & imaging results that were available during my care of the patient were reviewed by me and considered in my medical decision making (see chart for details).    MDM Rules/Calculators/A&P                         Patient presents as outlined above.  Patient does have history of significant COPD as well as CHF with history of volume overload.  Patient is currently on a prednisone burst from her pulmonologist.  At this time the lungs are grossly clear.  Chest x-ray does not show signs of congestive heart failure.  She does not have significant wheezing.  At this time, patient's vital signs stable without fever or hypoxia.  Will plan to continue treatment with prednisone and nebulized therapies as per the recommendations from her pulmonologist.  Her kidney function is stable.  Will add 1 additional 40 mg dose to her regimen today and day after tomorrow.  Careful return precautions reviewed.  Patient encouraged to follow-up as soon as feasible with her pulmonologist and either nephrology or cardiology. Final Clinical Impression(s) / ED Diagnoses Final diagnoses:  Peripheral edema  Severe comorbid illness  Chronic obstructive pulmonary disease, unspecified COPD type ([place])    Rx / DC Orders ED Discharge Orders    None       Charlesetta Shanks, MD 11/13/20 1710

## 2020-11-13 NOTE — Discharge Instructions (Addendum)
1.  Take an additional 40 mg of Lasix today and day after tomorrow.  Try to elevate your legs as much as possible when you are not up and moving around. 2.  Continue your prednisone as prescribed by your pulmonologist.  Use all of your nebulizer machine as prescribed. 3.  Try to make a follow-up appointment with your doctor within 3 to 5 days. 4.  If any of your symptoms are worsening or advancing, return to the emergency department for recheck.

## 2020-11-27 NOTE — Telephone Encounter (Signed)
Received MD signed application. Patient should have completed patient consent electronically.  Submitted Patient Assistance Application to [employer] for [facility] along with provider portion. Will update patient when we receive a response.  Fax# [phone] Phone# [phone]

## 2020-12-03 NOTE — Telephone Encounter (Signed)
Patient was denied patient assistance due to income. Could you please call [employer] for financial threshold? She's household of #2 with $6700/month  Phone# [phone]

## 2020-12-05 ENCOUNTER — Telehealth: Payer: Self-pay | Admitting: Pharmacy Technician

## 2020-12-05 NOTE — Telephone Encounter (Signed)
[employer] financial threshold : 2 person household with 87,100/yr.  Patient will bring in proof of financial status (w2, pay-stubs, SSI) for [place] to fax to [employer].  Will follow-up.

## 2020-12-06 ENCOUNTER — Telehealth: Payer: Self-pay | Admitting: Pulmonary Disease

## 2020-12-06 MED ORDER — PREDNISONE 10 MG PO TABS: ORAL_TABLET | ORAL | 0 refills | Status: DC

## 2020-12-06 MED ORDER — AMOXICILLIN-POT CLAVULANATE 875-125 MG PO TABS: 1.0000 | ORAL_TABLET | Freq: Two times a day (BID) | ORAL | 0 refills | Status: DC

## 2020-12-06 NOTE — Telephone Encounter (Signed)
For the predisone taper, I forgot to clarify it is with 10mg  tabs. So 30 tabs total.   Thanks, jon

## 2020-12-06 NOTE — Telephone Encounter (Signed)
Message was not addressed by Dr. Vaughan Browner while in clinic. Routing to DOD to address- please advise Dr. Erin Fulling, thanks!

## 2020-12-06 NOTE — Telephone Encounter (Signed)
C/o increased sob with minimal exertion, central chest tightness with deep inhale, temp of 100.2 this morning, prod cough with light green/clear mucus, headache X3-4 days.   Denies sinus congestion, loss of taste/smell, night sweats, nausea, vomiting, diarhhea.    Pt wearing 2lpm continuously, taking breztri as prescribed, duoneb X1, tylenol to help lower fever.  Pt typically wears 2lpm O2 qhs.  Pulse ox on room air was 87%, which is why pt started wearing O2.  O2 with 2lpm is in the mid 90's%.  I advised pt she can take her duoneb tx q6h prn to help with dyspnea.  Pt expressed understanding.     Pt had home covid test on 1/12 which was negative, administered by daughter (a nurse).     I advised pt (per our covid guidelines handout that her baseline O2 is below 88% on baseline oxygen support) that she should seek care at ED.  Pt does not want to do this at this time, states that because she tested negative on a home test that she would like to avoid the ED if at all possible.   Sending to Dr. Vaughan Browner to see if he has any additional recommendations.  Please advise, thanks.

## 2020-12-06 NOTE — Telephone Encounter (Signed)
Spoke with pt, aware of recs.  rx's sent to preferred pharmacy.  Nothing further needed at this time- will close encounter.    

## 2020-12-06 NOTE — Telephone Encounter (Signed)
Please send in prednisone taper: 40mg  x 3 days 30mg  x 3 days 20mg  x 3 days 10mg  x 3 days (30 tabs total)  Also send in augmentin for 7 days.  Patient should also check for any edema or signs of excess fluid as they were recently in the ER for shortness of breath and provided extra doses of diuretics. The frothy sputum is concerning for pulmonary edema.   Thanks, Wille Glaser

## 2020-12-14 ENCOUNTER — Emergency Department (HOSPITAL_COMMUNITY): Payer: Medicare Other

## 2020-12-14 ENCOUNTER — Other Ambulatory Visit: Payer: Self-pay

## 2020-12-14 ENCOUNTER — Encounter (HOSPITAL_COMMUNITY): Payer: Self-pay | Admitting: Emergency Medicine

## 2020-12-14 ENCOUNTER — Emergency Department (HOSPITAL_COMMUNITY)
Admission: EM | Admit: 2020-12-14 | Discharge: 2020-12-15 | Disposition: A | Discharge status: Home / Self Care | Payer: Medicare Other | Source: Home / Self Care | Attending: Emergency Medicine | Admitting: Emergency Medicine

## 2020-12-14 DIAGNOSIS — I110 Hypertensive heart disease with heart failure: Secondary | ICD-10-CM | POA: Insufficient documentation

## 2020-12-14 DIAGNOSIS — Z20822 Contact with and (suspected) exposure to covid-19: Secondary | ICD-10-CM | POA: Insufficient documentation

## 2020-12-14 DIAGNOSIS — Z95828 Presence of other vascular implants and grafts: Secondary | ICD-10-CM | POA: Insufficient documentation

## 2020-12-14 DIAGNOSIS — J449 Chronic obstructive pulmonary disease, unspecified: Secondary | ICD-10-CM | POA: Insufficient documentation

## 2020-12-14 DIAGNOSIS — K5792 Diverticulitis of intestine, part unspecified, without perforation or abscess without bleeding: Secondary | ICD-10-CM | POA: Insufficient documentation

## 2020-12-14 DIAGNOSIS — Z940 Kidney transplant status: Secondary | ICD-10-CM | POA: Insufficient documentation

## 2020-12-14 DIAGNOSIS — J45909 Unspecified asthma, uncomplicated: Secondary | ICD-10-CM | POA: Insufficient documentation

## 2020-12-14 DIAGNOSIS — M545 Low back pain, unspecified: Secondary | ICD-10-CM | POA: Insufficient documentation

## 2020-12-14 DIAGNOSIS — K5732 Diverticulitis of large intestine without perforation or abscess without bleeding: Secondary | ICD-10-CM | POA: Diagnosis not present

## 2020-12-14 DIAGNOSIS — I509 Heart failure, unspecified: Secondary | ICD-10-CM | POA: Insufficient documentation

## 2020-12-14 DIAGNOSIS — R509 Fever, unspecified: Secondary | ICD-10-CM | POA: Insufficient documentation

## 2020-12-14 DIAGNOSIS — Z87891 Personal history of nicotine dependence: Secondary | ICD-10-CM | POA: Insufficient documentation

## 2020-12-14 DIAGNOSIS — A419 Sepsis, unspecified organism: Secondary | ICD-10-CM | POA: Diagnosis not present

## 2020-12-14 DIAGNOSIS — Z96642 Presence of left artificial hip joint: Secondary | ICD-10-CM | POA: Insufficient documentation

## 2020-12-14 LAB — URINALYSIS, ROUTINE W REFLEX MICROSCOPIC
Bacteria, UA: NONE SEEN
Bilirubin Urine: NEGATIVE
Glucose, UA: NEGATIVE mg/dL
Ketones, ur: NEGATIVE mg/dL
Leukocytes,Ua: NEGATIVE
Nitrite: NEGATIVE
Protein, ur: 30 mg/dL — AB
Specific Gravity, Urine: 1.012 (ref 1.005–1.030)
pH: 6 (ref 5.0–8.0)

## 2020-12-14 LAB — PROTIME-INR
INR: 1.1 (ref 0.8–1.2)
Prothrombin Time: 14.1 seconds (ref 11.4–15.2)

## 2020-12-14 MED ORDER — FENTANYL CITRATE (PF) 100 MCG/2ML IJ SOLN
50.0000 ug | Freq: Once | INTRAMUSCULAR | Status: AC
  Administered 2020-12-14: 50 ug via INTRAVENOUS
  Filled 2020-12-14: qty 2

## 2020-12-14 MED ORDER — SODIUM CHLORIDE 0.9 % IV BOLUS
500.0000 mL | Freq: Once | INTRAVENOUS | Status: AC
  Administered 2020-12-14: 500 mL via INTRAVENOUS

## 2020-12-14 NOTE — ED Triage Notes (Signed)
Patient arrives complaining of fever and flank pain, hx kidney transplant. Patient states pain is on the same size. Fever 101 at home. Endorses some nausea, no vomiting or diarrhea, no urinary symptoms.

## 2020-12-14 NOTE — ED Provider Notes (Signed)
[facility] Emergency Department Provider Note MRN:  [id number]  Arrival date & time: 12/15/20     Chief Complaint   Flank Pain and Fever   History of Present Illness   April [name] is a 65 y.o. year-old female with a history of kidney transplant presenting to the ED with chief complaint of flank pain and fever.  Pain in the lower back and bilateral flanks for the past 1 to 2 days.  Fever of 101.3 today at home, measured on the forehead.  Feeling general malaise, generalized weakness worse in the legs.  Denies chest pain or shortness of breath.  Denies cough or cold-like symptoms, no burning with urination, no tenderness or pain of the transplanted kidney area.  Symptoms are moderate, constant, no exacerbating or alleviating factors.  Review of Systems  A complete 10 system review of systems was obtained and all systems are negative except as noted in the HPI and PMH.   Patient's Health History    Past Medical History:  Diagnosis Date  . Arthritis   . Asthma   . CHF (congestive heart failure) ([place])   . COPD (chronic obstructive pulmonary disease) ([place])   . Depression   . Essential hypertension 02/08/2020  . GERD (gastroesophageal reflux disease)   . Hyperlipidemia   . Hyperparathyroidism   . Polycystic kidney   . PONV (postoperative nausea and vomiting)     Past Surgical History:  Procedure Laterality Date  . 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 BILATERAL OOPHORECTOMY    . BIOPSY  05/19/2020   Procedure: BIOPSY;  Surgeon: Carol Ada, MD;  Location: [facility] ENDOSCOPY;  Service: Endoscopy;;  . CARPAL TUNNEL RELEASE  2000  . CESAREAN SECTION    . COLONOSCOPY WITH PROPOFOL N/A 05/19/2020   Procedure: COLONOSCOPY WITH PROPOFOL;  Surgeon: Carol Ada, MD;  Location: [place];  Service: Endoscopy;  Laterality: N/A;  . ENTEROSCOPY N/A 05/19/2020   Procedure: ENTEROSCOPY;  Surgeon: Carol Ada, MD;  Location: [place];   Service: Endoscopy;  Laterality: N/A;  . INSERTION OF DIALYSIS CATHETER  03/31/2012   Procedure: INSERTION OF DIALYSIS CATHETER;  Surgeon: Angelia Mould, MD;  Location: [place];  Service: Vascular;  Laterality: N/A;  insertion of dialysis catheter right internal jugular vein  . 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 ORIF TIBIA FRACTURE Left 12/23/2018   Procedure: OPEN REDUCTION INTERNAL FIXATION (ORIF) TIBIA FRACTURE;  Surgeon: Shona Needles, MD;  Location: [place];  Service: Orthopedics;  Laterality: Left;  . ORIF TIBIA PLATEAU Right 12/23/2018   Procedure: OPEN REDUCTION INTERNAL FIXATION (ORIF) TIBIAL PLATEAU;  Surgeon: Shona Needles, MD;  Location: [place];  Service: Orthopedics;  Laterality: Right;  . ORIF WRIST FRACTURE Left 12/08/2019   Procedure: OPEN REDUCTION INTERNAL FIXATION (ORIF) WRIST FRACTURE, REPAIR LACERATION LEFT FOREARM;  Surgeon: Dorna Leitz, MD;  Location: [facility];  Service: Orthopedics;  Laterality: Left;  . TONSILLECTOMY  1967  . TOTAL HIP ARTHROPLASTY Left 12/08/2019   Procedure: TOTAL HIP ARTHROPLASTY ANTERIOR APPROACH;  Surgeon: Dorna Leitz, MD;  Location: [facility];  Service: Orthopedics;  Laterality: Left;  . TUBAL LIGATION  2010    Family History  Problem Relation Age of Onset  . Hypertension Mother   . Heart disease Father        CABG history  . Polycystic kidney disease Father   . Polycystic kidney disease Brother     Social History   Socioeconomic  History  . Marital status: Married    Spouse name: Not on file  . Number of children: Not on file  . Years of education: Not on file  . Highest education level: Not on file  Occupational History  . Occupation: retired  Tobacco Use  . Smoking status: Former Smoker    Packs/day: 0.25    Years: 15.00    Pack years: 3.75    Types: Cigarettes    Quit date: 03/22/1998    Years since quitting: 22.7  . Smokeless tobacco: Never Used  Vaping Use  . Vaping Use: Never used  Substance and Sexual Activity  .  Alcohol use: No  . Drug use: No  . Sexual activity: Not on file  Other Topics Concern  . Not on file  Social History Narrative   Married, lives with spouse, son and mother   2 children > son and daughter   OCCUPATION: retired Education officer, [employer]   Social Determinants of Radio broadcast assistant Strain: Not on file  Food Insecurity: Not on file  Transportation Needs: Not on file  Physical Activity: Not on file  Stress: Not on file  Social Connections: Not on file  Intimate Partner Violence: Not on file     Physical Exam   Vitals:   12/15/20 0100 12/15/20 0130  BP: (!) 149/130 (!) 146/65  Pulse: 92 91  Resp: (!) 31 19  Temp:    SpO2: 100% 100%    CONSTITUTIONAL: Chronically ill-appearing, NAD NEURO:  Alert and oriented x 3, no focal deficits EYES:  eyes equal and reactive ENT/NECK:  no LAD, no JVD CARDIO: Regular rate, well-perfused, normal S1 and S2 PULM:  CTAB no wheezing or rhonchi GI/GU:  normal bowel sounds, non-distended, non-tender MSK/SPINE:  No gross deformities, no edema SKIN:  no rash, atraumatic PSYCH:  Appropriate speech and behavior  *Additional and/or pertinent findings included in MDM below  Diagnostic and Interventional Summary    EKG Interpretation  Date/Time:    Ventricular Rate:    PR Interval:    QRS Duration:   QT Interval:    QTC Calculation:   R Axis:     Text Interpretation:        Labs Reviewed  COMPREHENSIVE METABOLIC PANEL - Abnormal; Notable for the following components:      Result Value   BUN 49 (*)    Creatinine, Ser 1.16 (*)    Albumin 3.3 (*)    Total Bilirubin 1.9 (*)    GFR, Estimated 53 (*)    All other components within normal limits  CBC WITH DIFFERENTIAL/PLATELET - Abnormal; Notable for the following components:   RBC 3.61 (*)    Hemoglobin 11.3 (*)    MCV 103.3 (*)    RDW 19.1 (*)    Abs Immature Granulocytes 0.10 (*)    All other components within normal limits  URINALYSIS, ROUTINE W REFLEX MICROSCOPIC -  Abnormal; Notable for the following components:   Hgb urine dipstick SMALL (*)    Protein, ur 30 (*)    All other components within normal limits  CULTURE, BLOOD (ROUTINE X 2)  CULTURE, BLOOD (ROUTINE X 2)  SARS CORONAVIRUS 2 (TAT 6-24 HRS)  LACTIC ACID, PLASMA  PROTIME-INR    CT ABDOMEN PELVIS WO CONTRAST  Final Result    CT L-SPINE NO CHARGE  Final Result    DG Chest Port 1 View  Final Result      Medications  sodium chloride 0.9 % bolus 500  mL (0 mLs Intravenous Stopped 12/15/20 0023)  fentaNYL (SUBLIMAZE) injection 50 mcg (50 mcg Intravenous Given 12/14/20 2325)     Procedures  /  Critical Care Procedures  ED Course and Medical Decision Making  I have reviewed the triage vital signs, the nursing notes, and pertinent available records from the EMR.  Listed above are laboratory and imaging tests that I personally ordered, reviewed, and interpreted and then considered in my medical decision making (see below for details).  Reported fever at home and this immunosuppressed 65 year old female, kidney transplant back in 2013.  Abdomen is soft and nontender though given her history and her endorsing low back pain and lower extremity weakness will start with CT imaging of the abdomen and lumbar spine.  No objective lower extremity weakness on my exam, no bowel or bladder dysfunction.  If infectious work-up does not show any source, would consider MRI L-spine.     CT imaging reveals diverticulitis, which would explain patient's flank pain.  She has chronic lumbar/sacral fractures that would explain her low back pain.  On exam she continues to have intact strength and sensation of the lower extremities, doubt epidural abscess or CNS issue at this time.  She has no fever here in the emergency department, no leukocytosis, normal vital signs, benign abdomen.  She is immunosuppressed but there is no sign of systemic illness at this time, appropriate for discharge on antibiotics with strict  return precautions.  Barth Kirks. Sedonia Small, MD [place] [email or web address]  Final Clinical Impressions(s) / ED Diagnoses     ICD-10-CM   1. Diverticulitis  K57.92   2. Low back pain  M54.50 CT L-SPINE NO CHARGE    CT L-SPINE NO CHARGE    ED Discharge Orders         Ordered    HYDROcodone-acetaminophen (NORCO/VICODIN) 5-325 MG tablet  Every 4 hours PRN        12/15/20 0210    ondansetron (ZOFRAN ODT) 4 MG disintegrating tablet  Every 8 hours PRN        12/15/20 0210    ciprofloxacin (CIPRO) 500 MG tablet  2 times daily        12/15/20 0210    metroNIDAZOLE (FLAGYL) 500 MG tablet  3 times daily        12/15/20 0210           Discharge Instructions Discussed with and Provided to Patient:     Discharge Instructions     You were evaluated in the Emergency Department and after careful evaluation, we did not find any emergent condition requiring admission or further testing in the hospital.  Your exam/testing today was overall reassuring.  Your symptoms seem to be due to diverticulitis.  We recommend that you stop taking the Augmentin and start taking the ciprofloxacin and metronidazole antibiotics.  Use the Norco for pain, use the Zofran for nausea as needed.  Please return to the Emergency Department if you experience any worsening of your condition.  Thank you for allowing [place] to be a part of your care.        Maudie Flakes, MD 12/15/20 [phone]

## 2020-12-15 LAB — CBC WITH DIFFERENTIAL/PLATELET
Abs Immature Granulocytes: 0.1 10*3/uL — ABNORMAL HIGH (ref 0.00–0.07)
Basophils Absolute: 0 10*3/uL (ref 0.0–0.1)
Basophils Relative: 0 %
Eosinophils Absolute: 0.1 10*3/uL (ref 0.0–0.5)
Eosinophils Relative: 1 %
HCT: 37.3 % (ref 36.0–46.0)
Hemoglobin: 11.3 g/dL — ABNORMAL LOW (ref 12.0–15.0)
Immature Granulocytes: 1 %
Lymphocytes Relative: 15 %
Lymphs Abs: 1.4 10*3/uL (ref 0.7–4.0)
MCH: 31.3 pg (ref 26.0–34.0)
MCHC: 30.3 g/dL (ref 30.0–36.0)
MCV: 103.3 fL — ABNORMAL HIGH (ref 80.0–100.0)
Monocytes Absolute: 0.4 10*3/uL (ref 0.1–1.0)
Monocytes Relative: 4 %
Neutro Abs: 7.6 10*3/uL (ref 1.7–7.7)
Neutrophils Relative %: 79 %
Platelets: 187 10*3/uL (ref 150–400)
RBC: 3.61 MIL/uL — ABNORMAL LOW (ref 3.87–5.11)
RDW: 19.1 % — ABNORMAL HIGH (ref 11.5–15.5)
WBC: 9.6 10*3/uL (ref 4.0–10.5)
nRBC: 0 % (ref 0.0–0.2)

## 2020-12-15 LAB — COMPREHENSIVE METABOLIC PANEL
ALT: 15 U/L (ref 0–44)
AST: 17 U/L (ref 15–41)
Albumin: 3.3 g/dL — ABNORMAL LOW (ref 3.5–5.0)
Alkaline Phosphatase: 61 U/L (ref 38–126)
Anion gap: 12 (ref 5–15)
BUN: 49 mg/dL — ABNORMAL HIGH (ref 8–23)
CO2: 26 mmol/L (ref 22–32)
Calcium: 9.7 mg/dL (ref 8.9–10.3)
Chloride: 98 mmol/L (ref 98–111)
Creatinine, Ser: 1.16 mg/dL — ABNORMAL HIGH (ref 0.44–1.00)
GFR, Estimated: 53 mL/min — ABNORMAL LOW (ref >60)
Glucose, Bld: 77 mg/dL (ref 70–99)
Potassium: 4.1 mmol/L (ref 3.5–5.1)
Sodium: 136 mmol/L (ref 135–145)
Total Bilirubin: 1.9 mg/dL — ABNORMAL HIGH (ref 0.3–1.2)
Total Protein: 7.8 g/dL (ref 6.5–8.1)

## 2020-12-15 LAB — SARS CORONAVIRUS 2 (TAT 6-24 HRS): SARS Coronavirus 2: NEGATIVE

## 2020-12-15 LAB — LACTIC ACID, PLASMA: Lactic Acid, Venous: 0.9 mmol/L (ref 0.5–1.9)

## 2020-12-15 MED ORDER — CIPROFLOXACIN HCL 500 MG PO TABS: 500.0000 mg | ORAL_TABLET | Freq: Two times a day (BID) | ORAL | 0 refills | Status: DC

## 2020-12-15 MED ORDER — HYDROCODONE-ACETAMINOPHEN 5-325 MG PO TABS: 1.0000 | ORAL_TABLET | ORAL | 0 refills | Status: DC | PRN

## 2020-12-15 MED ORDER — METRONIDAZOLE 500 MG PO TABS: 500.0000 mg | ORAL_TABLET | Freq: Three times a day (TID) | ORAL | 0 refills | Status: DC

## 2020-12-15 MED ORDER — ONDANSETRON 4 MG PO TBDP: 4.0000 mg | ORAL_TABLET | Freq: Three times a day (TID) | ORAL | 0 refills | Status: DC | PRN

## 2020-12-15 NOTE — Discharge Instructions (Addendum)
You were evaluated in the Emergency Department and after careful evaluation, we did not find any emergent condition requiring admission or further testing in the hospital.  Your exam/testing today was overall reassuring.  Your symptoms seem to be due to diverticulitis.  We recommend that you stop taking the Augmentin and start taking the ciprofloxacin and metronidazole antibiotics.  Use the Norco for pain, use the Zofran for nausea as needed.  Please return to the Emergency Department if you experience any worsening of your condition.  Thank you for allowing [place] to be a part of your care.

## 2020-12-16 ENCOUNTER — Other Ambulatory Visit: Payer: Self-pay

## 2020-12-16 ENCOUNTER — Inpatient Hospital Stay (HOSPITAL_COMMUNITY)
Admission: EM | Admit: 2020-12-16 | Discharge: 2021-01-14 | DRG: 853 | Disposition: A | Discharge status: Skilled Nursing Facility | Payer: Medicare Other | Attending: Student | Admitting: Student

## 2020-12-16 ENCOUNTER — Emergency Department (HOSPITAL_COMMUNITY): Payer: Medicare Other

## 2020-12-16 ENCOUNTER — Inpatient Hospital Stay (HOSPITAL_COMMUNITY): Payer: Medicare Other

## 2020-12-16 ENCOUNTER — Encounter (HOSPITAL_COMMUNITY): Payer: Self-pay | Admitting: Emergency Medicine

## 2020-12-16 DIAGNOSIS — Z940 Kidney transplant status: Secondary | ICD-10-CM | POA: Diagnosis not present

## 2020-12-16 DIAGNOSIS — F419 Anxiety disorder, unspecified: Secondary | ICD-10-CM | POA: Diagnosis present

## 2020-12-16 DIAGNOSIS — K219 Gastro-esophageal reflux disease without esophagitis: Secondary | ICD-10-CM | POA: Diagnosis present

## 2020-12-16 DIAGNOSIS — R5381 Other malaise: Secondary | ICD-10-CM | POA: Diagnosis not present

## 2020-12-16 DIAGNOSIS — I5033 Acute on chronic diastolic (congestive) heart failure: Secondary | ICD-10-CM | POA: Diagnosis present

## 2020-12-16 DIAGNOSIS — D472 Monoclonal gammopathy: Secondary | ICD-10-CM | POA: Diagnosis present

## 2020-12-16 DIAGNOSIS — R0602 Shortness of breath: Secondary | ICD-10-CM | POA: Diagnosis not present

## 2020-12-16 DIAGNOSIS — D62 Acute posthemorrhagic anemia: Secondary | ICD-10-CM | POA: Diagnosis not present

## 2020-12-16 DIAGNOSIS — E2749 Other adrenocortical insufficiency: Secondary | ICD-10-CM | POA: Diagnosis present

## 2020-12-16 DIAGNOSIS — R1013 Epigastric pain: Secondary | ICD-10-CM | POA: Diagnosis present

## 2020-12-16 DIAGNOSIS — R52 Pain, unspecified: Secondary | ICD-10-CM

## 2020-12-16 DIAGNOSIS — M7989 Other specified soft tissue disorders: Secondary | ICD-10-CM | POA: Diagnosis not present

## 2020-12-16 DIAGNOSIS — K5732 Diverticulitis of large intestine without perforation or abscess without bleeding: Secondary | ICD-10-CM | POA: Diagnosis present

## 2020-12-16 DIAGNOSIS — I340 Nonrheumatic mitral (valve) insufficiency: Secondary | ICD-10-CM | POA: Diagnosis not present

## 2020-12-16 DIAGNOSIS — Z789 Other specified health status: Secondary | ICD-10-CM | POA: Diagnosis not present

## 2020-12-16 DIAGNOSIS — I130 Hypertensive heart and chronic kidney disease with heart failure and stage 1 through stage 4 chronic kidney disease, or unspecified chronic kidney disease: Secondary | ICD-10-CM | POA: Diagnosis present

## 2020-12-16 DIAGNOSIS — G4730 Sleep apnea, unspecified: Secondary | ICD-10-CM | POA: Diagnosis not present

## 2020-12-16 DIAGNOSIS — I471 Supraventricular tachycardia: Secondary | ICD-10-CM | POA: Diagnosis present

## 2020-12-16 DIAGNOSIS — E213 Hyperparathyroidism, unspecified: Secondary | ICD-10-CM | POA: Diagnosis present

## 2020-12-16 DIAGNOSIS — D696 Thrombocytopenia, unspecified: Secondary | ICD-10-CM | POA: Diagnosis present

## 2020-12-16 DIAGNOSIS — I480 Paroxysmal atrial fibrillation: Secondary | ICD-10-CM | POA: Diagnosis not present

## 2020-12-16 DIAGNOSIS — Q612 Polycystic kidney, adult type: Secondary | ICD-10-CM | POA: Diagnosis not present

## 2020-12-16 DIAGNOSIS — E871 Hypo-osmolality and hyponatremia: Secondary | ICD-10-CM | POA: Diagnosis present

## 2020-12-16 DIAGNOSIS — D84821 Immunodeficiency due to drugs: Secondary | ICD-10-CM | POA: Diagnosis present

## 2020-12-16 DIAGNOSIS — D72825 Bandemia: Secondary | ICD-10-CM | POA: Diagnosis not present

## 2020-12-16 DIAGNOSIS — Z7952 Long term (current) use of systemic steroids: Secondary | ICD-10-CM

## 2020-12-16 DIAGNOSIS — E875 Hyperkalemia: Secondary | ICD-10-CM | POA: Diagnosis not present

## 2020-12-16 DIAGNOSIS — R54 Age-related physical debility: Secondary | ICD-10-CM | POA: Diagnosis present

## 2020-12-16 DIAGNOSIS — I509 Heart failure, unspecified: Secondary | ICD-10-CM | POA: Diagnosis not present

## 2020-12-16 DIAGNOSIS — R069 Unspecified abnormalities of breathing: Secondary | ICD-10-CM

## 2020-12-16 DIAGNOSIS — I361 Nonrheumatic tricuspid (valve) insufficiency: Secondary | ICD-10-CM | POA: Diagnosis not present

## 2020-12-16 DIAGNOSIS — Z20822 Contact with and (suspected) exposure to covid-19: Secondary | ICD-10-CM | POA: Diagnosis present

## 2020-12-16 DIAGNOSIS — Z881 Allergy status to other antibiotic agents status: Secondary | ICD-10-CM

## 2020-12-16 DIAGNOSIS — T8619 Other complication of kidney transplant: Secondary | ICD-10-CM | POA: Diagnosis present

## 2020-12-16 DIAGNOSIS — E669 Obesity, unspecified: Secondary | ICD-10-CM | POA: Diagnosis present

## 2020-12-16 DIAGNOSIS — I10 Essential (primary) hypertension: Secondary | ICD-10-CM | POA: Diagnosis not present

## 2020-12-16 DIAGNOSIS — D631 Anemia in chronic kidney disease: Secondary | ICD-10-CM | POA: Diagnosis present

## 2020-12-16 DIAGNOSIS — J9621 Acute and chronic respiratory failure with hypoxia: Secondary | ICD-10-CM | POA: Diagnosis present

## 2020-12-16 DIAGNOSIS — A419 Sepsis, unspecified organism: Secondary | ICD-10-CM | POA: Diagnosis present

## 2020-12-16 DIAGNOSIS — N1831 Chronic kidney disease, stage 3a: Secondary | ICD-10-CM | POA: Diagnosis present

## 2020-12-16 DIAGNOSIS — I5031 Acute diastolic (congestive) heart failure: Secondary | ICD-10-CM

## 2020-12-16 DIAGNOSIS — I7300 Raynaud's syndrome without gangrene: Secondary | ICD-10-CM | POA: Diagnosis not present

## 2020-12-16 DIAGNOSIS — Z9981 Dependence on supplemental oxygen: Secondary | ICD-10-CM

## 2020-12-16 DIAGNOSIS — Z452 Encounter for adjustment and management of vascular access device: Secondary | ICD-10-CM

## 2020-12-16 DIAGNOSIS — J9601 Acute respiratory failure with hypoxia: Secondary | ICD-10-CM | POA: Diagnosis not present

## 2020-12-16 DIAGNOSIS — I483 Typical atrial flutter: Secondary | ICD-10-CM | POA: Diagnosis present

## 2020-12-16 DIAGNOSIS — R040 Epistaxis: Secondary | ICD-10-CM | POA: Diagnosis not present

## 2020-12-16 DIAGNOSIS — Z01818 Encounter for other preprocedural examination: Secondary | ICD-10-CM

## 2020-12-16 DIAGNOSIS — I4891 Unspecified atrial fibrillation: Secondary | ICD-10-CM | POA: Diagnosis not present

## 2020-12-16 DIAGNOSIS — I82531 Chronic embolism and thrombosis of right popliteal vein: Secondary | ICD-10-CM | POA: Diagnosis not present

## 2020-12-16 DIAGNOSIS — R198 Other specified symptoms and signs involving the digestive system and abdomen: Secondary | ICD-10-CM | POA: Diagnosis not present

## 2020-12-16 DIAGNOSIS — Z9889 Other specified postprocedural states: Secondary | ICD-10-CM | POA: Diagnosis not present

## 2020-12-16 DIAGNOSIS — E785 Hyperlipidemia, unspecified: Secondary | ICD-10-CM | POA: Diagnosis present

## 2020-12-16 DIAGNOSIS — K5792 Diverticulitis of intestine, part unspecified, without perforation or abscess without bleeding: Secondary | ICD-10-CM

## 2020-12-16 DIAGNOSIS — I5021 Acute systolic (congestive) heart failure: Secondary | ICD-10-CM | POA: Diagnosis not present

## 2020-12-16 DIAGNOSIS — D6489 Other specified anemias: Secondary | ICD-10-CM | POA: Diagnosis not present

## 2020-12-16 DIAGNOSIS — Y830 Surgical operation with transplant of whole organ as the cause of abnormal reaction of the patient, or of later complication, without mention of misadventure at the time of the procedure: Secondary | ICD-10-CM | POA: Diagnosis present

## 2020-12-16 DIAGNOSIS — Z6833 Body mass index (BMI) 33.0-33.9, adult: Secondary | ICD-10-CM

## 2020-12-16 DIAGNOSIS — E43 Unspecified severe protein-calorie malnutrition: Secondary | ICD-10-CM | POA: Diagnosis present

## 2020-12-16 DIAGNOSIS — R1114 Bilious vomiting: Secondary | ICD-10-CM | POA: Diagnosis not present

## 2020-12-16 DIAGNOSIS — Z888 Allergy status to other drugs, medicaments and biological substances status: Secondary | ICD-10-CM

## 2020-12-16 DIAGNOSIS — L89309 Pressure ulcer of unspecified buttock, unspecified stage: Secondary | ICD-10-CM | POA: Diagnosis not present

## 2020-12-16 DIAGNOSIS — G4733 Obstructive sleep apnea (adult) (pediatric): Secondary | ICD-10-CM | POA: Diagnosis present

## 2020-12-16 DIAGNOSIS — Z79899 Other long term (current) drug therapy: Secondary | ICD-10-CM

## 2020-12-16 DIAGNOSIS — E870 Hyperosmolality and hypernatremia: Secondary | ICD-10-CM | POA: Diagnosis not present

## 2020-12-16 DIAGNOSIS — J4550 Severe persistent asthma, uncomplicated: Secondary | ICD-10-CM | POA: Diagnosis present

## 2020-12-16 DIAGNOSIS — J9622 Acute and chronic respiratory failure with hypercapnia: Secondary | ICD-10-CM | POA: Diagnosis not present

## 2020-12-16 DIAGNOSIS — K659 Peritonitis, unspecified: Secondary | ICD-10-CM | POA: Diagnosis present

## 2020-12-16 DIAGNOSIS — M7981 Nontraumatic hematoma of soft tissue: Secondary | ICD-10-CM | POA: Diagnosis not present

## 2020-12-16 DIAGNOSIS — N179 Acute kidney failure, unspecified: Secondary | ICD-10-CM | POA: Diagnosis present

## 2020-12-16 DIAGNOSIS — K5720 Diverticulitis of large intestine with perforation and abscess without bleeding: Secondary | ICD-10-CM

## 2020-12-16 DIAGNOSIS — Z8719 Personal history of other diseases of the digestive system: Secondary | ICD-10-CM

## 2020-12-16 DIAGNOSIS — K429 Umbilical hernia without obstruction or gangrene: Secondary | ICD-10-CM | POA: Diagnosis present

## 2020-12-16 DIAGNOSIS — R6521 Severe sepsis with septic shock: Secondary | ICD-10-CM | POA: Diagnosis not present

## 2020-12-16 DIAGNOSIS — I272 Pulmonary hypertension, unspecified: Secondary | ICD-10-CM | POA: Diagnosis present

## 2020-12-16 DIAGNOSIS — Z933 Colostomy status: Secondary | ICD-10-CM | POA: Diagnosis not present

## 2020-12-16 DIAGNOSIS — I4819 Other persistent atrial fibrillation: Secondary | ICD-10-CM | POA: Diagnosis not present

## 2020-12-16 DIAGNOSIS — J9600 Acute respiratory failure, unspecified whether with hypoxia or hypercapnia: Secondary | ICD-10-CM | POA: Diagnosis not present

## 2020-12-16 DIAGNOSIS — I4892 Unspecified atrial flutter: Secondary | ICD-10-CM | POA: Diagnosis not present

## 2020-12-16 DIAGNOSIS — Z6830 Body mass index (BMI) 30.0-30.9, adult: Secondary | ICD-10-CM

## 2020-12-16 DIAGNOSIS — R0989 Other specified symptoms and signs involving the circulatory and respiratory systems: Secondary | ICD-10-CM

## 2020-12-16 DIAGNOSIS — Z87891 Personal history of nicotine dependence: Secondary | ICD-10-CM

## 2020-12-16 DIAGNOSIS — K7689 Other specified diseases of liver: Secondary | ICD-10-CM | POA: Diagnosis present

## 2020-12-16 DIAGNOSIS — I9581 Postprocedural hypotension: Secondary | ICD-10-CM | POA: Diagnosis not present

## 2020-12-16 DIAGNOSIS — J449 Chronic obstructive pulmonary disease, unspecified: Secondary | ICD-10-CM | POA: Diagnosis present

## 2020-12-16 DIAGNOSIS — R739 Hyperglycemia, unspecified: Secondary | ICD-10-CM

## 2020-12-16 DIAGNOSIS — Z8271 Family history of polycystic kidney: Secondary | ICD-10-CM

## 2020-12-16 DIAGNOSIS — R7989 Other specified abnormal findings of blood chemistry: Secondary | ICD-10-CM | POA: Diagnosis not present

## 2020-12-16 DIAGNOSIS — F32A Depression, unspecified: Secondary | ICD-10-CM | POA: Diagnosis present

## 2020-12-16 DIAGNOSIS — E869 Volume depletion, unspecified: Secondary | ICD-10-CM | POA: Diagnosis present

## 2020-12-16 DIAGNOSIS — E119 Type 2 diabetes mellitus without complications: Secondary | ICD-10-CM | POA: Diagnosis not present

## 2020-12-16 DIAGNOSIS — K5790 Diverticulosis of intestine, part unspecified, without perforation or abscess without bleeding: Secondary | ICD-10-CM | POA: Diagnosis not present

## 2020-12-16 DIAGNOSIS — I081 Rheumatic disorders of both mitral and tricuspid valves: Secondary | ICD-10-CM | POA: Diagnosis present

## 2020-12-16 DIAGNOSIS — M109 Gout, unspecified: Secondary | ICD-10-CM | POA: Diagnosis present

## 2020-12-16 DIAGNOSIS — T380X5A Adverse effect of glucocorticoids and synthetic analogues, initial encounter: Secondary | ICD-10-CM | POA: Diagnosis not present

## 2020-12-16 DIAGNOSIS — N189 Chronic kidney disease, unspecified: Secondary | ICD-10-CM | POA: Diagnosis not present

## 2020-12-16 DIAGNOSIS — K5900 Constipation, unspecified: Secondary | ICD-10-CM | POA: Diagnosis present

## 2020-12-16 DIAGNOSIS — R001 Bradycardia, unspecified: Secondary | ICD-10-CM | POA: Diagnosis not present

## 2020-12-16 DIAGNOSIS — E11649 Type 2 diabetes mellitus with hypoglycemia without coma: Secondary | ICD-10-CM | POA: Diagnosis not present

## 2020-12-16 DIAGNOSIS — M79601 Pain in right arm: Secondary | ICD-10-CM | POA: Diagnosis not present

## 2020-12-16 DIAGNOSIS — Z885 Allergy status to narcotic agent status: Secondary | ICD-10-CM

## 2020-12-16 DIAGNOSIS — Z9189 Other specified personal risk factors, not elsewhere classified: Secondary | ICD-10-CM | POA: Diagnosis not present

## 2020-12-16 DIAGNOSIS — R230 Cyanosis: Secondary | ICD-10-CM | POA: Diagnosis not present

## 2020-12-16 DIAGNOSIS — E1122 Type 2 diabetes mellitus with diabetic chronic kidney disease: Secondary | ICD-10-CM | POA: Diagnosis present

## 2020-12-16 DIAGNOSIS — L89152 Pressure ulcer of sacral region, stage 2: Secondary | ICD-10-CM | POA: Diagnosis not present

## 2020-12-16 DIAGNOSIS — Z96642 Presence of left artificial hip joint: Secondary | ICD-10-CM | POA: Diagnosis present

## 2020-12-16 DIAGNOSIS — D509 Iron deficiency anemia, unspecified: Secondary | ICD-10-CM | POA: Diagnosis present

## 2020-12-16 DIAGNOSIS — E1165 Type 2 diabetes mellitus with hyperglycemia: Secondary | ICD-10-CM | POA: Diagnosis not present

## 2020-12-16 DIAGNOSIS — Z8249 Family history of ischemic heart disease and other diseases of the circulatory system: Secondary | ICD-10-CM

## 2020-12-16 LAB — LACTIC ACID, PLASMA: Lactic Acid, Venous: 0.8 mmol/L (ref 0.5–1.9)

## 2020-12-16 LAB — CBC
HCT: 39.3 % (ref 36.0–46.0)
Hemoglobin: 12.2 g/dL (ref 12.0–15.0)
MCH: 31.8 pg (ref 26.0–34.0)
MCHC: 31 g/dL (ref 30.0–36.0)
MCV: 102.3 fL — ABNORMAL HIGH (ref 80.0–100.0)
Platelets: 181 10*3/uL (ref 150–400)
RBC: 3.84 MIL/uL — ABNORMAL LOW (ref 3.87–5.11)
RDW: 19.5 % — ABNORMAL HIGH (ref 11.5–15.5)
WBC: 20.8 10*3/uL — ABNORMAL HIGH (ref 4.0–10.5)
nRBC: 0 % (ref 0.0–0.2)

## 2020-12-16 LAB — URINALYSIS, ROUTINE W REFLEX MICROSCOPIC
Bilirubin Urine: NEGATIVE
Glucose, UA: NEGATIVE mg/dL
Ketones, ur: 20 mg/dL — AB
Leukocytes,Ua: NEGATIVE
Nitrite: NEGATIVE
Protein, ur: 100 mg/dL — AB
Specific Gravity, Urine: 1.014 (ref 1.005–1.030)
pH: 5 (ref 5.0–8.0)

## 2020-12-16 LAB — COMPREHENSIVE METABOLIC PANEL
ALT: 15 U/L (ref 0–44)
AST: 21 U/L (ref 15–41)
Albumin: 3.3 g/dL — ABNORMAL LOW (ref 3.5–5.0)
Alkaline Phosphatase: 66 U/L (ref 38–126)
Anion gap: 17 — ABNORMAL HIGH (ref 5–15)
BUN: 39 mg/dL — ABNORMAL HIGH (ref 8–23)
CO2: 19 mmol/L — ABNORMAL LOW (ref 22–32)
Calcium: 9.6 mg/dL (ref 8.9–10.3)
Chloride: 98 mmol/L (ref 98–111)
Creatinine, Ser: 1.33 mg/dL — ABNORMAL HIGH (ref 0.44–1.00)
GFR, Estimated: 45 mL/min — ABNORMAL LOW (ref >60)
Glucose, Bld: 68 mg/dL — ABNORMAL LOW (ref 70–99)
Potassium: 4.2 mmol/L (ref 3.5–5.1)
Sodium: 134 mmol/L — ABNORMAL LOW (ref 135–145)
Total Bilirubin: 3.1 mg/dL — ABNORMAL HIGH (ref 0.3–1.2)
Total Protein: 7.8 g/dL (ref 6.5–8.1)

## 2020-12-16 LAB — POC SARS CORONAVIRUS 2 AG -  ED: SARS Coronavirus 2 Ag: NEGATIVE

## 2020-12-16 LAB — HEMOGLOBIN A1C
Hgb A1c MFr Bld: 5.6 % (ref 4.8–5.6)
Mean Plasma Glucose: 114.02 mg/dL

## 2020-12-16 LAB — CBG MONITORING, ED
Glucose-Capillary: 60 mg/dL — ABNORMAL LOW (ref 70–99)
Glucose-Capillary: 54 mg/dL — ABNORMAL LOW (ref 70–99)

## 2020-12-16 LAB — BRAIN NATRIURETIC PEPTIDE: B Natriuretic Peptide: 2872.2 pg/mL — ABNORMAL HIGH (ref 0.0–100.0)

## 2020-12-16 LAB — LIPASE, BLOOD: Lipase: 21 U/L (ref 11–51)

## 2020-12-16 MED ORDER — LATANOPROST 0.005 % OP SOLN
1.0000 [drp] | Freq: Every day | OPHTHALMIC | Status: DC
  Administered 2020-12-17 – 2021-01-13 (×28): 1 [drp] via OPHTHALMIC
  Filled 2020-12-16: qty 2.5

## 2020-12-16 MED ORDER — FENTANYL CITRATE (PF) 100 MCG/2ML IJ SOLN
50.0000 ug | Freq: Once | INTRAMUSCULAR | Status: AC
  Administered 2020-12-16: 50 ug via INTRAVENOUS
  Filled 2020-12-16: qty 2

## 2020-12-16 MED ORDER — FUROSEMIDE 10 MG/ML IJ SOLN
40.0000 mg | Freq: Once | INTRAMUSCULAR | Status: AC
  Administered 2020-12-16: 40 mg via INTRAVENOUS
  Filled 2020-12-16: qty 4

## 2020-12-16 MED ORDER — ISOSORBIDE MONONITRATE ER 60 MG PO TB24
60.0000 mg | ORAL_TABLET | Freq: Every day | ORAL | Status: DC
  Administered 2020-12-16 – 2020-12-17 (×2): 60 mg via ORAL
  Filled 2020-12-16 (×2): qty 1

## 2020-12-16 MED ORDER — PREDNISONE 5 MG PO TABS
7.5000 mg | ORAL_TABLET | Freq: Every day | ORAL | Status: DC
  Administered 2020-12-17 – 2020-12-19 (×3): 7.5 mg via ORAL
  Filled 2020-12-16: qty 1
  Filled 2020-12-16 (×2): qty 2

## 2020-12-16 MED ORDER — DORZOLAMIDE HCL 2 % OP SOLN
1.0000 [drp] | Freq: Every day | OPHTHALMIC | Status: DC
  Administered 2020-12-18 – 2021-01-14 (×28): 1 [drp] via OPHTHALMIC
  Filled 2020-12-16: qty 10

## 2020-12-16 MED ORDER — ONDANSETRON HCL 4 MG PO TABS: 4.0000 mg | ORAL_TABLET | Freq: Four times a day (QID) | ORAL | Status: DC | PRN

## 2020-12-16 MED ORDER — DEXTROSE 50 % IV SOLN
25.0000 g | INTRAVENOUS | Status: AC
  Administered 2020-12-16: 25 g via INTRAVENOUS
  Filled 2020-12-16: qty 50

## 2020-12-16 MED ORDER — METRONIDAZOLE IN NACL 5-0.79 MG/ML-% IV SOLN
500.0000 mg | Freq: Once | INTRAVENOUS | Status: AC
  Administered 2020-12-16: 500 mg via INTRAVENOUS
  Filled 2020-12-16: qty 100

## 2020-12-16 MED ORDER — IOHEXOL 9 MG/ML PO SOLN
ORAL | Status: AC
  Filled 2020-12-16: qty 1000

## 2020-12-16 MED ORDER — FLUOXETINE HCL 20 MG PO CAPS
40.0000 mg | ORAL_CAPSULE | Freq: Every day | ORAL | Status: DC
  Administered 2020-12-16 – 2020-12-28 (×13): 40 mg via ORAL
  Filled 2020-12-16 (×14): qty 2

## 2020-12-16 MED ORDER — CIPROFLOXACIN IN D5W 400 MG/200ML IV SOLN: 400.0000 mg | Freq: Two times a day (BID) | INTRAVENOUS | Status: DC

## 2020-12-16 MED ORDER — METRONIDAZOLE IN NACL 5-0.79 MG/ML-% IV SOLN
500.0000 mg | Freq: Three times a day (TID) | INTRAVENOUS | Status: DC
  Administered 2020-12-16: 500 mg via INTRAVENOUS
  Filled 2020-12-16: qty 100

## 2020-12-16 MED ORDER — GUAIFENESIN ER 600 MG PO TB12
600.0000 mg | ORAL_TABLET | Freq: Two times a day (BID) | ORAL | Status: DC
  Administered 2020-12-16 – 2020-12-28 (×25): 600 mg via ORAL
  Filled 2020-12-16 (×27): qty 1

## 2020-12-16 MED ORDER — VALACYCLOVIR HCL 500 MG PO TABS
500.0000 mg | ORAL_TABLET | Freq: Two times a day (BID) | ORAL | Status: DC
  Administered 2020-12-16 – 2020-12-30 (×28): 500 mg via ORAL
  Filled 2020-12-16 (×30): qty 1

## 2020-12-16 MED ORDER — ACETAMINOPHEN 650 MG RE SUPP
650.0000 mg | Freq: Four times a day (QID) | RECTAL | Status: DC | PRN
Start: 2020-12-16 — End: 2020-12-30

## 2020-12-16 MED ORDER — BRIMONIDINE TARTRATE 0.15 % OP SOLN
1.0000 [drp] | Freq: Two times a day (BID) | OPHTHALMIC | Status: DC
  Administered 2020-12-17 – 2021-01-14 (×56): 1 [drp] via OPHTHALMIC
  Filled 2020-12-16: qty 5

## 2020-12-16 MED ORDER — MYCOPHENOLATE SODIUM 180 MG PO TBEC
180.0000 mg | DELAYED_RELEASE_TABLET | Freq: Two times a day (BID) | ORAL | Status: DC
  Administered 2020-12-16 – 2020-12-30 (×27): 180 mg via ORAL
  Filled 2020-12-16 (×29): qty 1

## 2020-12-16 MED ORDER — BISACODYL 10 MG RE SUPP
10.0000 mg | Freq: Every day | RECTAL | Status: DC | PRN
Start: 2020-12-16 — End: 2020-12-29
  Administered 2020-12-24 – 2020-12-27 (×2): 10 mg via RECTAL
  Filled 2020-12-16 (×2): qty 1

## 2020-12-16 MED ORDER — VERAPAMIL HCL ER 240 MG PO TBCR
240.0000 mg | EXTENDED_RELEASE_TABLET | Freq: Every day | ORAL | Status: DC
  Administered 2020-12-16 – 2020-12-17 (×2): 240 mg via ORAL
  Filled 2020-12-16 (×3): qty 1

## 2020-12-16 MED ORDER — ONDANSETRON HCL 4 MG/2ML IJ SOLN
4.0000 mg | Freq: Four times a day (QID) | INTRAMUSCULAR | Status: DC | PRN
  Administered 2020-12-21 – 2021-01-13 (×16): 4 mg via INTRAVENOUS
  Filled 2020-12-16 (×16): qty 2

## 2020-12-16 MED ORDER — INSULIN ASPART 100 UNIT/ML ~~LOC~~ SOLN
0.0000 [IU] | Freq: Three times a day (TID) | SUBCUTANEOUS | Status: DC
  Filled 2020-12-16: qty 0.15

## 2020-12-16 MED ORDER — ONDANSETRON HCL 4 MG/2ML IJ SOLN
4.0000 mg | Freq: Once | INTRAMUSCULAR | Status: AC
  Administered 2020-12-16: 4 mg via INTRAVENOUS
  Filled 2020-12-16: qty 2

## 2020-12-16 MED ORDER — FERROUS SULFATE 325 (65 FE) MG PO TABS
325.0000 mg | ORAL_TABLET | Freq: Every day | ORAL | Status: DC
  Administered 2020-12-17 – 2020-12-28 (×11): 325 mg via ORAL
  Filled 2020-12-16 (×13): qty 1

## 2020-12-16 MED ORDER — METOCLOPRAMIDE HCL 5 MG/ML IJ SOLN
10.0000 mg | Freq: Once | INTRAMUSCULAR | Status: AC
  Administered 2020-12-16: 10 mg via INTRAVENOUS
  Filled 2020-12-16: qty 2

## 2020-12-16 MED ORDER — PIPERACILLIN-TAZOBACTAM 3.375 G IVPB
3.3750 g | Freq: Three times a day (TID) | INTRAVENOUS | Status: DC
  Administered 2020-12-16 – 2020-12-17 (×2): 3.375 g via INTRAVENOUS
  Filled 2020-12-16 (×2): qty 50

## 2020-12-16 MED ORDER — INSULIN ASPART 100 UNIT/ML ~~LOC~~ SOLN
3.0000 [IU] | Freq: Three times a day (TID) | SUBCUTANEOUS | Status: DC
  Filled 2020-12-16: qty 0.03

## 2020-12-16 MED ORDER — IPRATROPIUM-ALBUTEROL 0.5-2.5 (3) MG/3ML IN SOLN
3.0000 mL | Freq: Four times a day (QID) | RESPIRATORY_TRACT | Status: DC | PRN
  Administered 2020-12-17 – 2020-12-27 (×3): 3 mL via RESPIRATORY_TRACT
  Filled 2020-12-16 (×3): qty 3

## 2020-12-16 MED ORDER — CIPROFLOXACIN IN D5W 400 MG/200ML IV SOLN
400.0000 mg | Freq: Once | INTRAVENOUS | Status: AC
  Administered 2020-12-16: 400 mg via INTRAVENOUS
  Filled 2020-12-16: qty 200

## 2020-12-16 MED ORDER — SODIUM CHLORIDE 0.9 % IV BOLUS
1000.0000 mL | Freq: Once | INTRAVENOUS | Status: AC
  Administered 2020-12-16: 1000 mL via INTRAVENOUS

## 2020-12-16 MED ORDER — GABAPENTIN 100 MG PO CAPS
200.0000 mg | ORAL_CAPSULE | Freq: Two times a day (BID) | ORAL | Status: DC
Start: 2020-12-16 — End: 2020-12-29
  Administered 2020-12-16 – 2020-12-28 (×25): 200 mg via ORAL
  Filled 2020-12-16 (×27): qty 2

## 2020-12-16 MED ORDER — PANTOPRAZOLE SODIUM 40 MG PO TBEC
40.0000 mg | DELAYED_RELEASE_TABLET | Freq: Two times a day (BID) | ORAL | Status: DC
  Administered 2020-12-16 – 2020-12-28 (×25): 40 mg via ORAL
  Filled 2020-12-16 (×27): qty 1

## 2020-12-16 MED ORDER — TACROLIMUS 1 MG PO CAPS
1.0000 mg | ORAL_CAPSULE | Freq: Two times a day (BID) | ORAL | Status: DC
  Administered 2020-12-16 – 2020-12-30 (×27): 1 mg via ORAL
  Filled 2020-12-16 (×29): qty 1

## 2020-12-16 MED ORDER — ALLOPURINOL 100 MG PO TABS
100.0000 mg | ORAL_TABLET | Freq: Every day | ORAL | Status: DC
  Administered 2020-12-17 – 2020-12-28 (×12): 100 mg via ORAL
  Filled 2020-12-16 (×13): qty 1

## 2020-12-16 MED ORDER — HEPARIN SODIUM (PORCINE) 5000 UNIT/ML IJ SOLN
5000.0000 [IU] | Freq: Three times a day (TID) | INTRAMUSCULAR | Status: DC
  Administered 2020-12-16 – 2020-12-17 (×3): 5000 [IU] via SUBCUTANEOUS
  Filled 2020-12-16 (×3): qty 1

## 2020-12-16 MED ORDER — CYCLOSPORINE 0.05 % OP EMUL
1.0000 [drp] | Freq: Two times a day (BID) | OPHTHALMIC | Status: DC
  Administered 2020-12-17 – 2021-01-14 (×58): 1 [drp] via OPHTHALMIC
  Filled 2020-12-16 (×59): qty 1

## 2020-12-16 MED ORDER — ACETAMINOPHEN 325 MG PO TABS: 650.0000 mg | ORAL_TABLET | Freq: Four times a day (QID) | ORAL | Status: DC | PRN

## 2020-12-16 MED ORDER — ACETAMINOPHEN 325 MG PO TABS
650.0000 mg | ORAL_TABLET | Freq: Four times a day (QID) | ORAL | Status: DC | PRN
  Administered 2020-12-18 – 2020-12-21 (×4): 650 mg via ORAL
  Filled 2020-12-16 (×4): qty 2

## 2020-12-16 MED ORDER — MAGNESIUM OXIDE 400 (241.3 MG) MG PO TABS
400.0000 mg | ORAL_TABLET | Freq: Two times a day (BID) | ORAL | Status: DC
  Administered 2020-12-16 – 2020-12-27 (×21): 400 mg via ORAL
  Filled 2020-12-16 (×22): qty 1

## 2020-12-16 NOTE — Progress Notes (Signed)
[facility] Surgery Consult Note  GLENNIS BORGER May 05, 1956  [id number].    Colonoscopy 6/27/21Diverticulosis in the sigmoid colon, in the descending colon and in the ascending colon. 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 Small bowel endoscopy 05/19/20:The esophagus was normal. Findings: Localized moderately erythematous mucosa without bleeding was found in the gastric body. Biopsies were taken with a cold forceps for histology. The examined duodenum was normal. There was no evidence of significant pathology in the entire examined portion of jejunum. - Normal esophagus. - Erythematous mucosa in the gastric body. Biopsied.   Requesting MD: Barton Fanny Chief Complaint:  Abdominal pain, nausea, dry heaves Reason for Consult: diverticulitis  HPI:  The patient is a 65 year old female with a history of renal transplant, on immunosuppression.  She presented to the ED on 12/14/2020, with complaints of fever and flank pain temperatures up to 101.  She had some nausea, but no vomiting, diarrhea, or urinary tract and symptoms.  Work-up including CT of the abdomen pelvis without contrast mild uncomplicated sigmoid diverticulitis with moderate to severe sigmoid diverticulosis.  Additionally she has chronic bilateral sacral ala and angulated S2 fracture.  Innumerable bilateral renal cortical cysts and multiple hepatic cyst in keeping with autosomal dominant polycystic kidney disease.  There was a normal appearing right lower quadrant kidney transplant.  She was discharged home on ciprofloxacin, Flagyl, and Norco for treatment.  She returns today with abdominal pain in the left lower quadrant, ongoing nausea and dry heaves.  Work-up today, shows no fever, respiratory rate is up to 39 this afternoon.  Blood pressure 154/74, O2 sat sats are reported as low as 49, labs shows sodium of 134, CO2 of 19, glucose 68, BUN 39, creatinine 1.33, total bilirubin is 3.1, AST 21, ALT 15, alk  phos 66. WBC is 20.8, hemoglobin 12.2, hematocrit 39.3, platelets 181,000.  Covid is negative.  BNP is 2,872.2.    ROS: Review of Systems  Constitutional: Positive for fever (Sounds like she had fever at home initially she does not know she had any since discharge on 122/22.).  HENT: Negative.   Eyes: Negative.   Respiratory: Positive for cough, shortness of breath (Chronic) and wheezing (Intermittent). Negative for hemoptysis and sputum production.        She has been on oxygen intermittently for several years.  Quit smoking about 16 years ago.  Cardiovascular: Positive for leg swelling.  Gastrointestinal: Positive for abdominal pain (She complains of some mid abdominal pain around a soft umbilical hernia.  On my exam she had no lower quadrant tenderness.  She complained of some on the right when Dr. Tera Helper evaluated her.), heartburn, nausea and vomiting (No vomiting but some dry heaves.). Negative for blood in stool, constipation, diarrhea and melena.       She says she has intermittent diarrhea and constipation but none currently.  Genitourinary: Negative.   Musculoskeletal: Positive for back pain.  Skin:       Renal transplant with AV fistula left upper extremity marked bilateral ecchymosis  Neurological: Positive for headaches.  Endo/Heme/Allergies: Bruises/bleeds easily.  Psychiatric/Behavioral: Positive for depression. The patient is nervous/anxious.     Family History  Problem Relation Age of Onset  . Hypertension Mother   . Heart disease Father        CABG history  . Polycystic kidney disease Father   . Polycystic kidney disease Brother     Past Medical History:  Diagnosis Date  . Arthritis   . Asthma   .  CHF (congestive heart failure) ([place])   . COPD (chronic obstructive pulmonary disease) ([place])   . Depression   . Essential hypertension 02/08/2020  . GERD (gastroesophageal reflux disease)   . Hyperlipidemia   . Hyperparathyroidism   . Polycystic kidney   . PONV  (postoperative nausea and vomiting)     Past Surgical History:  Procedure Laterality Date  . 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 BILATERAL OOPHORECTOMY    . BIOPSY  05/19/2020   Procedure: BIOPSY;  Surgeon: Carol Ada, MD;  Location: [facility] ENDOSCOPY;  Service: Endoscopy;;  . CARPAL TUNNEL RELEASE  2000  . CESAREAN SECTION    . COLONOSCOPY WITH PROPOFOL N/A 05/19/2020   Procedure: COLONOSCOPY WITH PROPOFOL;  Surgeon: Carol Ada, MD;  Location: [place];  Service: Endoscopy;  Laterality: N/A;  . ENTEROSCOPY N/A 05/19/2020   Procedure: ENTEROSCOPY;  Surgeon: Carol Ada, MD;  Location: [place];  Service: Endoscopy;  Laterality: N/A;  . INSERTION OF DIALYSIS CATHETER  03/31/2012   Procedure: INSERTION OF DIALYSIS CATHETER;  Surgeon: Angelia Mould, MD;  Location: [place];  Service: Vascular;  Laterality: N/A;  insertion of dialysis catheter right internal jugular vein  . 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 ORIF TIBIA FRACTURE Left 12/23/2018   Procedure: OPEN REDUCTION INTERNAL FIXATION (ORIF) TIBIA FRACTURE;  Surgeon: Shona Needles, MD;  Location: [place];  Service: Orthopedics;  Laterality: Left;  . ORIF TIBIA PLATEAU Right 12/23/2018   Procedure: OPEN REDUCTION INTERNAL FIXATION (ORIF) TIBIAL PLATEAU;  Surgeon: Shona Needles, MD;  Location: [place];  Service: Orthopedics;  Laterality: Right;  . ORIF WRIST FRACTURE Left 12/08/2019   Procedure: OPEN REDUCTION INTERNAL FIXATION (ORIF) WRIST FRACTURE, REPAIR LACERATION LEFT FOREARM;  Surgeon: Dorna Leitz, MD;  Location: [facility];  Service: Orthopedics;  Laterality: Left;  . TONSILLECTOMY  1967  . TOTAL HIP ARTHROPLASTY Left 12/08/2019   Procedure: TOTAL HIP ARTHROPLASTY ANTERIOR APPROACH;  Surgeon: Dorna Leitz, MD;  Location: [facility];  Service: Orthopedics;  Laterality: Left;  . TUBAL LIGATION  2010    Social History:  reports that she quit smoking about 22 years ago. Her smoking use included cigarettes.  She has a 3.75 pack-year smoking history. She has never used smokeless tobacco. She reports that she does not drink alcohol and does not use drugs.  Allergies:  Allergies  Allergen Reactions  . Infed [Iron Dextran] Other (See Comments)    Chest tightness  . Pentamidine Itching, Shortness Of Breath and Swelling  . Budesonide-Formoterol Fumarate     Other reaction(s): hoarseness  . Bupropion     Other reaction(s): exacerbated depression  . Crestor [Rosuvastatin]     Other reaction(s): body aches, swelling  . Duloxetine Hcl     Other reaction(s): GI SE, weepy, worsening fatigue, foggy  . Erythromycin [Erythromycin] Other (See Comments)    Mouth Ulcers  . Iohexol Other (See Comments)    Per patient "she has had a kidney transplant and should never have contrast"  . Oxycodone Nausea Only and Nausea And Vomiting  . Pravastatin Sodium     Other reaction(s): myalgias  . Erythromycin Rash    Causes breakout in mouth  . Ultram [Tramadol Hcl] Anxiety    Prior to Admission medications   Medication Sig Start Date End Date Taking? Authorizing Provider  acetaminophen (TYLENOL) 325 MG tablet Take 2 tablets (650 mg total) by mouth every 6 (six) hours as needed for mild pain (or Fever >/=  101). 05/22/20  Yes Elgergawy, Silver Huguenin, MD  albuterol (VENTOLIN HFA) 108 (90 Base) MCG/ACT inhaler Inhale 2 puffs into the lungs every 4 (four) hours as needed for wheezing or shortness of breath. 01/04/20  Yes Angiulli, Lavon Paganini, PA-C  allopurinol (ZYLOPRIM) 100 MG tablet Take 100 mg by mouth daily. 11/29/20  Yes [provider]  amoxicillin (AMOXIL) 500 MG capsule Take 1,000 mg by mouth See admin instructions. 1054m twice daily as needed for dentist 12/09/20  Yes [provider]  brimonidine (ALPHAGAN P) 0.1 % SOLN Place 1 drop into both eyes 2 (two) times daily.    Yes [provider]  Budeson-Glycopyrrol-Formoterol (BREZTRI AEROSPHERE) 160-9-4.8 MCG/ACT AERO Inhale 2 puffs into the lungs  2 (two) times daily. 08/20/20  Yes WMartyn Ehrich NP  cholecalciferol (VITAMIN D3) 25 MCG (1000 UNIT) tablet Take 1 tablet (1,000 Units total) by mouth daily. 01/04/20  Yes Angiulli, DLavon Paganini PA-C  cyanocobalamin 1000 MCG tablet Take 1,000 mcg by mouth daily.   Yes [provider]  cycloSPORINE (RESTASIS) 0.05 % ophthalmic emulsion Place 1 drop into both eyes 2 (two) times daily. 01/04/20  Yes Angiulli, DLavon Paganini PA-C  dorzolamide (TRUSOPT) 2 % ophthalmic solution Place 1 drop into both eyes daily. 06/25/19  Yes [provider]  ferrous sulfate 325 (65 FE) MG tablet Take 325 mg by mouth daily with breakfast.   Yes [provider]  FLUoxetine (PROZAC) 40 MG capsule Take 1 capsule (40 mg total) by mouth daily. 01/04/20  Yes Angiulli, DLavon Paganini PA-C  furosemide (LASIX) 80 MG tablet Take 80 mg by mouth daily. 10/30/20  Yes [provider]  gabapentin (NEURONTIN) 100 MG capsule Take 200 mg by mouth 2 (two) times daily.   Yes [provider]  guaiFENesin (MUCINEX) 600 MG 12 hr tablet Take 600 mg by mouth 2 (two) times daily.   Yes [provider]  ipratropium-albuterol (DUONEB) 0.5-2.5 (3) MG/3ML SOLN Take 3 mLs by nebulization every 6 (six) hours as needed (sob/wheezing).  08/24/20  Yes [provider]  isosorbide mononitrate (IMDUR) 60 MG 24 hr tablet Take 1 tablet (60 mg total) by mouth daily. 05/23/20  Yes Elgergawy, DSilver Huguenin MD  magnesium oxide (MAG-OX) 400 MG tablet Take 1 tablet (400 mg total) by mouth 2 (two) times daily. 01/04/20  Yes Angiulli, DLavon Paganini PA-C  mycophenolate (MYFORTIC) 180 MG EC tablet Take 180 mg by mouth 2 (two) times daily.   Yes [provider]  pantoprazole (PROTONIX) 40 MG tablet Take 1 tablet (40 mg total) by mouth 2 (two) times daily. 01/04/20  Yes Angiulli, DLavon Paganini PA-C  predniSONE (DELTASONE) 10 MG tablet 49m3 days, 3072m3 days, 18m47m days, 10mg34mays, then stop. Patient taking differently: Take  10-40 mg by mouth See admin instructions. 40mgX83mys, 30mg X58mys, 18mg X367ms, 10mgX3 d45m then stop. 12/06/20  Yes Dewald, JFreddi StarrGRAF 1 MG capsule Take 1 mg by mouth 2 (two) times daily.  03/22/19  Yes [provider]  promethazine (PHENERGAN) 25 MG tablet Take 1 tablet (25 mg total) by mouth every 8 (eight) hours as needed for up to 15 doses for nausea or vomiting. 09/02/20  Yes Curatolo, Adam, DO  TRAVATAN Z 0.004 % SOLN ophthalmic solution Place 1 drop into both eyes at bedtime. 07/03/16  Yes [provider]  valACYclovir (VALTREX) 500 MG tablet Take 500 mg by mouth 2 (two) times daily.   Yes [provider]  verapamil (CALAN-SR) 240 MG CR tablet Take 240 mg by mouth daily.   Yes [provider]  amoxicillin-clavulanate (AUGMENTIN) 875-125 MG tablet Take 1 tablet by mouth 2 (two) times daily. 12/06/20   Freddi Starr, MD  ciprofloxacin (CIPRO) 500 MG tablet Take 1 tablet (500 mg total) by mouth 2 (two) times daily for 7 days. Patient not taking: Reported on 12/16/2020 12/15/20 12/22/20  Maudie Flakes, MD  HYDROcodone-acetaminophen (NORCO/VICODIN) 5-325 MG tablet Take 1 tablet by mouth every 4 (four) hours as needed. Patient not taking: Reported on 12/16/2020 12/15/20   Maudie Flakes, MD  metroNIDAZOLE (FLAGYL) 500 MG tablet Take 1 tablet (500 mg total) by mouth 3 (three) times daily for 7 days. Patient not taking: Reported on 12/16/2020 12/15/20 12/22/20  Maudie Flakes, MD  ondansetron (ZOFRAN ODT) 4 MG disintegrating tablet Take 1 tablet (4 mg total) by mouth every 8 (eight) hours as needed for nausea or vomiting. Patient not taking: Reported on 12/16/2020 12/15/20   Maudie Flakes, MD     Blood pressure (!) 147/78, pulse (!) 101, temperature 98.1 F (36.7 C), temperature source Oral, resp. rate (!) 28, SpO2 97 %. Physical Exam:  General: Chronically ill-appearing 65 year old female on oxygen, no acute distress. HEENT: head is  normocephalic, atraumatic.  Sclera are noninjected.  Pupils are equal.   Ears and nose without any masses or lesions.  Mouth is pink and moist Heart: regular, rate, and rhythm.  Normal s1,s2. No obvious murmurs, gallops, or rubs noted.  Palpable radial and pedal pulses bilaterally. Lungs: rales both bases.  On O2, she drops her sats when turning over on her side down to 79 while I was in the room.  I turned her flat and turned her oxygen up to 3 L nasal cannula. Abd: soft, she has a scar on the right side from her renal transplant.  She also has an umbilical hernia which is soft and easily reduces.  This is where she points to the pain since admission on 12/15/2019.  On my exam she did not have any pain in either right or left lower quadrants.  She did have some discomfort with Dr. Gala Lewandowsky exam.  Right lower quadrant. MS: She has an AV fistula in her left upper extremity.  Her left lower extremity is markedly swollen and edematous.  +2 edema.  Also slightly tender to palpation of the lower extremity.  Ulcers were difficult to find. Skin: warm and dry.  She has marked ecchymosis in both hands and arms. Neuro: Cranial nerves 2-12 grossly intact, sensation is normal throughout Psych: A&Ox3, she admits having trouble with anxiety and some depression.  Results for orders placed or performed during the hospital encounter of 12/16/20 (from the past 48 hour(s))  Brain natriuretic peptide     Status: Abnormal   Collection Time: 12/16/20  9:52 AM  Result Value Ref Range   B Natriuretic Peptide 2,872.2 (H) 0.0 - 100.0 pg/mL    Comment: Performed at [facility], [place] [address], [place], [place] [address]  Comprehensive metabolic panel     Status: Abnormal   Collection Time: 12/16/20  9:52 AM  Result Value Ref Range   Sodium 134 (L) 135 - 145 mmol/L   Potassium 4.2 3.5 - 5.1 mmol/L   Chloride 98 98 - 111 mmol/L   CO2 19 (L) 22 - 32 mmol/L   Glucose, Bld 68 (L) 70 - 99 mg/dL    Comment:  Glucose reference range applies  only to samples taken after fasting for at least 8 hours.   BUN 39 (H) 8 - 23 mg/dL   Creatinine, Ser 1.33 (H) 0.44 - 1.00 mg/dL   Calcium 9.6 8.9 - 10.3 mg/dL   Total Protein 7.8 6.5 - 8.1 g/dL   Albumin 3.3 (L) 3.5 - 5.0 g/dL   AST 21 15 - 41 U/L   ALT 15 0 - 44 U/L   Alkaline Phosphatase 66 38 - 126 U/L   Total Bilirubin 3.1 (H) 0.3 - 1.2 mg/dL   GFR, Estimated 45 (L) >60 mL/min    Comment: (NOTE) Calculated using the CKD-EPI Creatinine Equation (2021)    Anion gap 17 (H) 5 - 15    Comment: Performed at [facility], [place] [address], [place], [place] [address]  Lipase, blood     Status: None   Collection Time: 12/16/20  9:52 AM  Result Value Ref Range   Lipase 21 11 - 51 U/L    Comment: Performed at [facility], [place] [address] [place], [place] [address]  CBC     Status: Abnormal   Collection Time: 12/16/20  9:52 AM  Result Value Ref Range   WBC 20.8 (H) 4.0 - 10.5 K/uL   RBC 3.84 (L) 3.87 - 5.11 MIL/uL   Hemoglobin 12.2 12.0 - 15.0 g/dL   HCT 39.3 36.0 - 46.0 %   MCV 102.3 (H) 80.0 - 100.0 fL   MCH 31.8 26.0 - 34.0 pg   MCHC 31.0 30.0 - 36.0 g/dL   RDW 19.5 (H) 11.5 - 15.5 %   Platelets 181 150 - 400 K/uL   nRBC 0.0 0.0 - 0.2 %    Comment: Performed at [facility], [place] [address], [place], [place] [address]  POC SARS Coronavirus 2 Ag-ED - Nasal Swab (BD Veritor Kit)     Status: None   Collection Time: 12/16/20 11:29 AM  Result Value Ref Range   SARS Coronavirus 2 Ag NEGATIVE NEGATIVE    Comment: (NOTE) SARS-CoV-2 antigen NOT DETECTED.   Negative results are presumptive.  Negative results do not preclude SARS-CoV-2 infection and should not be used as the sole basis for treatment or other patient management decisions, including infection  control decisions, particularly in the presence of clinical signs and  symptoms consistent with COVID-19, or in those who have been in contact  with the virus.  Negative results must be combined with clinical observations, patient history, and epidemiological information. The expected result is Negative.  Fact Sheet for Patients: HandmadeRecipes.com.cy  Fact Sheet for Healthcare Providers: FuneralLife.at  This test is not yet approved or cleared by the [place] FDA and  has been authorized for detection and/or diagnosis of SARS-CoV-2 by FDA under an Emergency Use Authorization (EUA).  This EUA will remain in effect (meaning this test can be used) for the duration of  the COV ID-19 declaration under Section 564(b)(1) of the Act, 21 U.S.C. section 360bbb-3(b)(1), unless the authorization is terminated or revoked sooner.    Urinalysis, Routine w reflex microscopic Urine, Clean Catch     Status: Abnormal   Collection Time: 12/16/20  2:00 PM  Result Value Ref Range   Color, Urine YELLOW YELLOW   APPearance CLEAR CLEAR   Specific Gravity, Urine 1.014 1.005 - 1.030   pH 5.0 5.0 - 8.0   Glucose, UA NEGATIVE NEGATIVE mg/dL   Hgb urine dipstick MODERATE (A) NEGATIVE   Bilirubin Urine NEGATIVE NEGATIVE   Ketones,  ur 20 (A) NEGATIVE mg/dL   Protein, ur 100 (A) NEGATIVE mg/dL   Nitrite NEGATIVE NEGATIVE   Leukocytes,Ua NEGATIVE NEGATIVE   RBC / HPF 0-5 0 - 5 RBC/hpf   WBC, UA 0-5 0 - 5 WBC/hpf   Bacteria, UA RARE (A) NONE SEEN   Squamous Epithelial / LPF 0-5 0 - 5    Comment: Performed at [facility], [place] [address] [place], [place] [address]   CT ABDOMEN PELVIS WO CONTRAST  Result Date: 12/15/2020 CLINICAL DATA:  Fever, flank pain, status post renal transplantation. Low back pain. EXAM: CT ABDOMEN AND PELVIS WITHOUT CONTRAST CT LUMBAR SPINE WITHOUT CONTRAST TECHNIQUE: Multidetector CT imaging of the abdomen and pelvis was performed following the standard protocol without IV contrast. Subsequently, dedicated CT imaging of the lumbar spine was performed without  contrast administration. Coronal and sagittal reformats were created. CT dose reduction techniques including automated exposure control and iterative reconstruction were utilized where possible. COMPARISON:  09/04/2020 FINDINGS: Lower chest: Mild bibasilar atelectasis. Trace right pleural effusion. Cardiac size within normal limits. Trace pericardial fluid is likely physiologic. 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 Dominant cyst within the left hepatic lobe is unchanged measuring 3.2 cm. Liver size within normal limits. Progressive mild intrahepatic biliary ductal dilation within segment 3 of the liver likely related to multiple central cysts within the left hepatic lobe. Stable mild peripheral biliary ductal dilation within segment 7 of the liver. No extrahepatic biliary ductal dilation. Gallbladder unremarkable. Pancreas: Unremarkable Spleen: Unremarkable Adrenals/Urinary Tract: The adrenal glands are unremarkable. The native kidneys are normal in position and are enlarged with innumerable cortical cysts identified in keeping with changes of polycystic kidney disease. More solid-appearing 3.2 cm rounded lesion within the lower pole of the right kidney, axial image # 50/2, appears smaller than on prior examination and likely represents a hyperdense renal cyst. No hydronephrosis. 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 Normal size and cortical thickness. No hydronephrosis. No intrarenal calcifications. Evaluation of the pelvis is limited by streak artifact from left total hip arthroplasty. The visualized bladder is decompressed. A a punctate focus of nondependent gas within the bladder lumen is nonspecific, possibly related to recent catheterization. Stomach/Bowel: Moderate to severe sigmoid diverticulosis. There is superimposed pericolonic inflammatory change identified within the mid sigmoid  colon in keeping with changes of mild sigmoid diverticulitis. No free intraperitoneal gas or loculated intra-abdominal fluid collections. No evidence of obstruction. The stomach, small bowel, and large bowel are otherwise unremarkable. Appendix normal. No free intraperitoneal fluid. Vascular/Lymphatic: Moderate aortoiliac atherosclerotic calcification. No aortic aneurysm. No pathologic adenopathy within the abdomen and pelvis. Reproductive: Uterus and bilateral adnexa are unremarkable. Other: Small fat containing umbilical hernia. Broad-based right flank incisional hernia containing unremarkable loops of large and small bowel as well as the appendix. 2.6 cm left inguinal soft tissue nodule is again identified and is unchanged from prior examination, indeterminate. Musculoskeletal: Left total hip arthroplasty has been performed. Chronic fractures of the sacral ala bilaterally are again identified. No acute bone abnormality. Sagittal and coronal reformats of the lumbar spine demonstrate normal lumbar lordosis. No acute fracture or listhesis of the lumbar spine. Chronic angulated fracture deformity of S2 is again seen, unchanged from prior CT examination. Chronic bilateral sacral ale are fractures identified, also unchanged from prior examination. Vertebral body height and intervertebral disc heights are preserved. Review of the axial images demonstrates: T12-L1: Intervertebral disc height preserved. No significant  canal stenosis. No neural foraminal narrowing. No significant facet arthrosis. L1-2: Disc height preserved. No significant canal stenosis. No neural foraminal narrowing. No significant facet arthrosis. L2-3: Disc height preserved. Spinal canal is widely patent. No significant neural foraminal narrowing. No significant facet arthrosis. L3-4: Disc height preserved. Mild broad-based posterior disc bulge. Mild hypertrophy of the lamina propria. Mild resultant central canal stenosis. No significant neural  foraminal narrowing. L4-5: Disc height preserved. Mild broad-based posterior disc bulge. Moderate bilateral facet arthrosis. Mild hypertrophy of the lamina propria. Moderate resultant central canal stenosis. No significant neural foraminal narrowing. Possible impingement of the traversing L5 nerve roots bilaterally within the lateral recess. This is not well assessed on this examination. L5-S1: Disc height preserved. Spinal canal widely patent. No significant neural foraminal narrowing. Mild bilateral facet arthrosis. IMPRESSION: Innumerable bilateral renal cortical cysts and multiple hepatic cysts in keeping with age is of autosomal dominant polycystic kidney disease. Normal appearance of the a right lower quadrant transplant kidney. Mild, uncomplicated sigmoid diverticulitis. Background moderate to severe sigmoid diverticulosis. Chronic bilateral sacral ala and angulated S2 fractures. No acute fracture or listhesis of the lumbar spine. Aortic Atherosclerosis (ICD10-I70.0). Electronically Signed   By: Fidela Salisbury MD   On: 12/15/2020 00:50   CT L-SPINE NO CHARGE  Result Date: 12/15/2020 CLINICAL DATA:  Fever, flank pain, status post renal transplantation. Low back pain. EXAM: CT ABDOMEN AND PELVIS WITHOUT CONTRAST CT LUMBAR SPINE WITHOUT CONTRAST TECHNIQUE: Multidetector CT imaging of the abdomen and pelvis was performed following the standard protocol without IV contrast. Subsequently, dedicated CT imaging of the lumbar spine was performed without contrast administration. Coronal and sagittal reformats were created. CT dose reduction techniques including automated exposure control and iterative reconstruction were utilized where possible. COMPARISON:  09/04/2020 FINDINGS: Lower chest: Mild bibasilar atelectasis. Trace right pleural effusion. Cardiac size within normal limits. Trace pericardial fluid is likely physiologic. Hepatobiliary: Multiple simple cysts are seen scattered throughout the liver in keeping  with autosomal dominant polycystic kidney disease. Dominant cyst within the left hepatic lobe is unchanged measuring 3.2 cm. Liver size within normal limits. Progressive mild intrahepatic biliary ductal dilation within segment 3 of the liver likely related to multiple central cysts within the left hepatic lobe. Stable mild peripheral biliary ductal dilation within segment 7 of the liver. No extrahepatic biliary ductal dilation. Gallbladder unremarkable. Pancreas: Unremarkable Spleen: Unremarkable Adrenals/Urinary Tract: The adrenal glands are unremarkable. The native kidneys are normal in position and are enlarged with innumerable cortical cysts identified in keeping with changes of polycystic kidney disease. More solid-appearing 3.2 cm rounded lesion within the lower pole of the right kidney, axial image # 50/2, appears smaller than on prior examination and likely represents a hyperdense renal cyst. No hydronephrosis. 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 Normal size and cortical thickness. No hydronephrosis. No intrarenal calcifications. Evaluation of the pelvis is limited by streak artifact from left total hip arthroplasty. The visualized bladder is decompressed. A a punctate focus of nondependent gas within the bladder lumen is nonspecific, possibly related to recent catheterization. Stomach/Bowel: Moderate to severe sigmoid diverticulosis. There is superimposed pericolonic inflammatory change identified within the mid sigmoid colon in keeping with changes of mild sigmoid diverticulitis. No free intraperitoneal gas or loculated intra-abdominal fluid collections. No evidence of obstruction. The stomach, small bowel, and large bowel are otherwise unremarkable. Appendix normal. No free intraperitoneal fluid. Vascular/Lymphatic: Moderate aortoiliac atherosclerotic calcification. No aortic aneurysm. No pathologic adenopathy within the abdomen and pelvis. Reproductive: Uterus and bilateral adnexa  are unremarkable. Other: Small fat containing umbilical hernia. Broad-based right flank incisional hernia containing unremarkable loops of large and small bowel as well as the appendix. 2.6 cm left inguinal soft tissue nodule is again identified and is unchanged from prior examination, indeterminate. Musculoskeletal: Left total hip arthroplasty has been performed. Chronic fractures of the sacral ala bilaterally are again identified. No acute bone abnormality. Sagittal and coronal reformats of the lumbar spine demonstrate normal lumbar lordosis. No acute fracture or listhesis of the lumbar spine. Chronic angulated fracture deformity of S2 is again seen, unchanged from prior CT examination. Chronic bilateral sacral ale are fractures identified, also unchanged from prior examination. Vertebral body height and intervertebral disc heights are preserved. Review of the axial images demonstrates: T12-L1: Intervertebral disc height preserved. No significant canal stenosis. No neural foraminal narrowing. No significant facet arthrosis. L1-2: Disc height preserved. No significant canal stenosis. No neural foraminal narrowing. No significant facet arthrosis. L2-3: Disc height preserved. Spinal canal is widely patent. No significant neural foraminal narrowing. No significant facet arthrosis. L3-4: Disc height preserved. Mild broad-based posterior disc bulge. Mild hypertrophy of the lamina propria. Mild resultant central canal stenosis. No significant neural foraminal narrowing. L4-5: Disc height preserved. Mild broad-based posterior disc bulge. Moderate bilateral facet arthrosis. Mild hypertrophy of the lamina propria. Moderate resultant central canal stenosis. No significant neural foraminal narrowing. Possible impingement of the traversing L5 nerve roots bilaterally within the lateral recess. This is not well assessed on this examination. L5-S1: Disc height preserved. Spinal canal widely patent. No significant neural foraminal  narrowing. Mild bilateral facet arthrosis. IMPRESSION: Innumerable bilateral renal cortical cysts and multiple hepatic cysts in keeping with age is of autosomal dominant polycystic kidney disease. Normal appearance of the a right lower quadrant transplant kidney. Mild, uncomplicated sigmoid diverticulitis. Background moderate to severe sigmoid diverticulosis. Chronic bilateral sacral ala and angulated S2 fractures. No acute fracture or listhesis of the lumbar spine. Aortic Atherosclerosis (ICD10-I70.0). Electronically Signed   By: Fidela Salisbury MD   On: 12/15/2020 00:50   DG Chest Port 1 View  Result Date: 12/16/2020 CLINICAL DATA:  Weakness, pleural effusions, LEFT lower quadrant abdominal pain, nausea, dry heaving, pleural effusions by prior radiograph EXAM: PORTABLE CHEST 1 VIEW COMPARISON:  Portable exam 1205 hours compared to 12/14/2020 FINDINGS: Borderline enlargement of cardiac silhouette with slight vascular congestion. Mediastinal contours normal. Bibasilar atelectasis and tiny pleural effusions. No acute infiltrate/edema or pneumothorax. Bones demineralized. IMPRESSION: Enlargement of cardiac silhouette with pulmonary vascular congestion. Bibasilar atelectasis and tiny pleural effusions. Electronically Signed   By: Lavonia Dana M.D.   On: 12/16/2020 12:27   DG Chest Port 1 View  Result Date: 12/15/2020 CLINICAL DATA:  Fever and flank pain. Nausea. Previous kidney transplant. EXAM: PORTABLE CHEST 1 VIEW COMPARISON:  11/13/2020 FINDINGS: Shallow inspiration with atelectasis in the lung bases. Mild cardiac enlargement. No vascular congestion or edema. Blunting of the costophrenic angles suggesting small effusions. No pneumothorax. IMPRESSION: Shallow inspiration with atelectasis in the lung bases. Small bilateral pleural effusions. Electronically Signed   By: Lucienne Capers M.D.   On: 12/15/2020 00:14      Assessment/Plan Polycystic disease with renal transplant 2013   -Prograf/prednisone CHF/uncertain etiology  -BNP 2872 Chronic COPD/history of tobacco use Hx SVT Multiple drug allergies   Possible sepsis  -Fever at home, WBC 20.8, tachypenia, tachycardia Abdominal pain with history of diverticulitis on CT 12/14/2020 Dyspnea/intermittent home O2 use  FEN:NPO ID:  Ask pharmacy to dose for Zosyn DVT: Subcu heparin  Plan: She  has been admitted to medicine.  I have ordered a CT with oral contrast, but no IV contrast.  I have asked pharmacy to dose her with Zosyn.  Have also ordered bilateral lower extremity Dopplers.  I recommended renal consult and discussed with Dr.Mujtaba, the attending. Dr. Kieth Brightly is on tonight is aware of the patient's CT pending.  We will follow with you.  Earnstine Regal [facility] Surgery 12/16/2020, 3:11 PM Please see Amion for pager number during day hours 7:00am-4:30pm

## 2020-12-16 NOTE — ED Notes (Signed)
Admitting MD at bedside.

## 2020-12-16 NOTE — ED Triage Notes (Signed)
Patient BIBA from home c/o abdominal pain LQ. Also reports nausea and dry heaving. Dx with diverticulitis on Saturday and d/c with antibiotics. Patient still reporting pain at this time. Hx kidney transplant. Patient denies fevers.   VS WDL  Patient wears 2L [place] as needed at home

## 2020-12-16 NOTE — ED Notes (Signed)
[place] is aware of lab recollect, both lavender and green.

## 2020-12-16 NOTE — ED Notes (Signed)
Purewick in placed

## 2020-12-16 NOTE — Progress Notes (Signed)
Pharmacy Antibiotic Note  April [name] is a 65 y.o. female admitted on 12/16/2020 with intra-abdominal infection.  Pharmacy has been consulted for Zosyn dosing. She has been on Cipro/Flagyl as outpatient however unable to tolerate due to nausea/vomiting.   Scr at patient's baseline, CrCl >30ml/min  Plan: Zosyn 3.375g IV q8h (4 hour infusion).  No dose adjustments anticipated.  Pharmacy will sign off & monitor peripherally via electronic surveillance software.      Temp (24hrs), Avg:98.1 F (36.7 C), Min:98.1 F (36.7 C), Max:98.1 F (36.7 C)  Recent Labs  Lab 12/14/20 2242 12/16/20 0952 12/16/20 1515  WBC 9.6 20.8*  --   CREATININE 1.16* 1.33*  --   LATICACIDVEN 0.9  --  0.8    Estimated Creatinine Clearance: 40.3 mL/min (A) (by C-G formula based on SCr of 1.33 mg/dL (H)).    Allergies  Allergen Reactions  . Infed [Iron Dextran] Other (See Comments)    Chest tightness  . Pentamidine Itching, Shortness Of Breath and Swelling  . Budesonide-Formoterol Fumarate     Other reaction(s): hoarseness  . Bupropion     Other reaction(s): exacerbated depression  . Crestor [Rosuvastatin]     Other reaction(s): body aches, swelling  . Duloxetine Hcl     Other reaction(s): GI SE, weepy, worsening fatigue, foggy  . Erythromycin [Erythromycin] Other (See Comments)    Mouth Ulcers  . Iohexol Other (See Comments)    Per patient "she has had a kidney transplant and should never have contrast"  . Oxycodone Nausea Only and Nausea And Vomiting  . Pravastatin Sodium     Other reaction(s): myalgias  . Erythromycin Rash    Causes breakout in mouth  . Ultram [Tramadol Hcl] Anxiety    Thank you for allowing pharmacy to be a part of this patient's care.  Netta Cedars PharmD, BCPS 12/16/2020 5:31 PM

## 2020-12-16 NOTE — CV Procedure (Signed)
BLE venous completed. Preliminary findings given to [place], Therapist, sports.  Results can be found under chart review under CV PROC. 12/16/2020 7:01 PM Julane Crock RVT, RDMS

## 2020-12-16 NOTE — ED Provider Notes (Signed)
[facility] DEPT Provider Note   CSN: [id number] Arrival date & time: 12/16/20  0830     History Chief Complaint  Patient presents with  . Abdominal Pain    April [name] is a 65 y.o. female.  HPI Patient presents 2 days after being diagnosed with diverticulitis, now with concern for ongoing abdominal pain, nausea, p.o. intolerance, weakness. She has been unable to take her antibiotics since discharge, notes that her pain is worse, focally in the periumbilical, superior abdomen. Patient knowledges multiple medical issues including history of kidney transplant in 2013, [facility]. No new fever, no new chest pain, no new dyspnea.     Past Medical History:  Diagnosis Date  . Arthritis   . Asthma   . CHF (congestive heart failure) ([place])   . COPD (chronic obstructive pulmonary disease) ([place])   . Depression   . Essential hypertension 02/08/2020  . GERD (gastroesophageal reflux disease)   . Hyperlipidemia   . Hyperparathyroidism   . Polycystic kidney   . PONV (postoperative nausea and vomiting)     Patient Active Problem List   Diagnosis Date Noted  . Fever of unknown origin 10/24/2020  . Fever of unknown origin (FUO) 10/23/2020  . Lower GI bleeding 09/25/2020  . Diarrhea 09/10/2020  . Acute renal failure superimposed on stage 3b chronic kidney disease ([place]) 09/08/2020  . Diverticulitis 09/04/2020  . SIRS (systemic inflammatory response syndrome) ([place]) 07/02/2020  . Chronic kidney disease, stage 3a ([place])   . 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 History of immunosuppressive therapy 05/27/2020  . FUO (fever of unknown origin) 05/15/2020  . Immunocompromised state ([place])   . Decubitus ulcer of coccyx, unstageable ([place]) 05/01/2020  . Pericardial effusion 04/16/2020  . Chronic respiratory failure with hypoxia ([place]) 04/16/2020  . Proteinuria 04/16/2020  . Pressure injury of skin 04/08/2020  . COPD (chronic obstructive  pulmonary disease) ([place]) 04/07/2020  . SVT (supraventricular tachycardia) ([place])   . Anemia of chronic disease   . Palliative care encounter   . Goals of care, counseling/discussion   . Essential hypertension 02/08/2020  . Glaucoma 02/08/2020  . Sepsis with encephalopathy without septic shock ([place]) 01/31/2020  . Fracture of left superior pubic ramus (H[facility])   . Pain   . Anxiety state   . Left displaced femoral neck fracture ([place]) 12/15/2019  . Status post total hip replacement, left   . Post-op pain   . Polycystic kidney   . Closed left hip fracture ([name]) 12/07/2019  . Left wrist fracture 12/07/2019  . Medication management 09/22/2019  . Physical deconditioning 09/22/2019  . Acute respiratory failure with hypoxia ([place]) 04/19/2019  . Acute on chronic diastolic CHF (congestive heart failure) ([place]) 04/19/2019  . Bilateral closed proximal tibial fracture 12/21/2018  . Asthma, chronic, unspecified asthma severity, with acute exacerbation 10/03/2018  . Chronic diastolic CHF (congestive heart failure) ([place]) 10/01/2017  . Asthma, mild intermittent 07/15/2016  . GERD without esophagitis 07/15/2016  . Renal transplant recipient 07/15/2016  . Hyperlipidemia 07/15/2016  . Depression 07/15/2016  . Bronchitis, mucopurulent recurrent ([place]) 08/08/2014  . Chronic cough 07/24/2014  . End stage renal disease ([place]) 03/23/2012  . 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 BILATERAL OOPHORECTOMY    . BIOPSY  05/19/2020   Procedure: BIOPSY;  Surgeon: Carol Ada, MD;  Location:  [facility];  Service: Endoscopy;;  . CARPAL TUNNEL RELEASE  2000  . CESAREAN SECTION    . COLONOSCOPY WITH PROPOFOL N/A 05/19/2020   Procedure: COLONOSCOPY WITH PROPOFOL;  Surgeon: Carol Ada, MD;  Location: [facility];  Service: Endoscopy;  Laterality: N/A;  .  ENTEROSCOPY N/A 05/19/2020   Procedure: ENTEROSCOPY;  Surgeon: Carol Ada, MD;  Location: [facility];  Service: Endoscopy;  Laterality: N/A;  . INSERTION OF DIALYSIS CATHETER  03/31/2012   Procedure: INSERTION OF DIALYSIS CATHETER;  Surgeon: Angelia Mould, MD;  Location: [facility];  Service: Vascular;  Laterality: N/A;  insertion of dialysis catheter right internal jugular vein  . 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 ORIF TIBIA FRACTURE Left 12/23/2018   Procedure: OPEN REDUCTION INTERNAL FIXATION (ORIF) TIBIA FRACTURE;  Surgeon: Shona Needles, MD;  Location: [facility];  Service: Orthopedics;  Laterality: Left;  . ORIF TIBIA PLATEAU Right 12/23/2018   Procedure: OPEN REDUCTION INTERNAL FIXATION (ORIF) TIBIAL PLATEAU;  Surgeon: Shona Needles, MD;  Location: [facility];  Service: Orthopedics;  Laterality: Right;  . ORIF WRIST FRACTURE Left 12/08/2019   Procedure: OPEN REDUCTION INTERNAL FIXATION (ORIF) WRIST FRACTURE, REPAIR LACERATION LEFT FOREARM;  Surgeon: Dorna Leitz, MD;  Location: [facility];  Service: Orthopedics;  Laterality: Left;  . TONSILLECTOMY  1967  . TOTAL HIP ARTHROPLASTY Left 12/08/2019   Procedure: TOTAL HIP ARTHROPLASTY ANTERIOR APPROACH;  Surgeon: Dorna Leitz, MD;  Location: [facility];  Service: Orthopedics;  Laterality: Left;  . TUBAL LIGATION  2010     OB History   No obstetric history on file.     Family History  Problem Relation Age of Onset  . Hypertension Mother   . Heart disease Father        CABG history  . Polycystic kidney disease Father   . Polycystic kidney disease Brother     Social History   Tobacco Use  . Smoking status: Former Smoker    Packs/day: 0.25    Years: 15.00    Pack years: 3.75    Types: Cigarettes    Quit date: 03/22/1998    Years since quitting: 22.7  . Smokeless tobacco: Never Used  Vaping Use  . Vaping Use: Never used  Substance Use Topics  . Alcohol use: No  . Drug use: No    Home Medications Prior to Admission medications    Medication Sig Start Date End Date Taking? Authorizing Provider  acetaminophen (TYLENOL) 325 MG tablet Take 2 tablets (650 mg total) by mouth every 6 (six) hours as needed for mild pain (or Fever >/= 101). 05/22/20   Elgergawy, Silver Huguenin, MD  albuterol (VENTOLIN HFA) 108 (90 Base) MCG/ACT inhaler Inhale 2 puffs into the lungs every 4 (four) hours as needed for wheezing or shortness of breath. 01/04/20   Angiulli, Lavon Paganini, PA-C  allopurinol (ZYLOPRIM) 300 MG tablet Take 300 mg by mouth as needed (gout).    [provider]  amoxicillin-clavulanate (AUGMENTIN) 875-125 MG tablet Take 1 tablet by mouth 2 (two) times daily. 12/06/20   Freddi Starr, MD  ascorbic acid (VITAMIN C) 500 MG tablet Take 500 mg by mouth daily.    [provider]  brimonidine (ALPHAGAN P) 0.1 % SOLN Place 1 drop into both eyes 2 (two) times daily.     [provider]  Budeson-Glycopyrrol-Formoterol (BREZTRI AEROSPHERE) 160-9-4.8 MCG/ACT AERO Inhale 2 puffs into the lungs 2 (two) times daily. 08/20/20   Martyn Ehrich, NP  cholecalciferol (  VITAMIN D3) 25 MCG (1000 UNIT) tablet Take 1 tablet (1,000 Units total) by mouth daily. 01/04/20   Angiulli, Lavon Paganini, PA-C  ciprofloxacin (CIPRO) 500 MG tablet Take 1 tablet (500 mg total) by mouth 2 (two) times daily for 7 days. 12/15/20 12/22/20  Maudie Flakes, MD  cyanocobalamin 1000 MCG tablet Take 1,000 mcg by mouth daily.    [provider]  cycloSPORINE (RESTASIS) 0.05 % ophthalmic emulsion Place 1 drop into both eyes 2 (two) times daily. 01/04/20   Angiulli, Lavon Paganini, PA-C  dorzolamide (TRUSOPT) 2 % ophthalmic solution Place 1 drop into both eyes daily. 06/25/19   [provider]  ferrous sulfate 325 (65 FE) MG tablet Take 325 mg by mouth daily with breakfast.    [provider]  FLUoxetine (PROZAC) 40 MG capsule Take 1 capsule (40 mg total) by mouth daily. 01/04/20   Angiulli, Lavon Paganini, PA-C  furosemide (LASIX) 80 MG tablet Take 80  mg by mouth daily. 10/30/20   [provider]  gabapentin (NEURONTIN) 100 MG capsule Take 200 mg by mouth 3 (three) times daily.    [provider]  guaiFENesin (MUCINEX) 600 MG 12 hr tablet Take 600 mg by mouth 2 (two) times daily.    [provider]  HYDROcodone-acetaminophen (NORCO/VICODIN) 5-325 MG tablet Take 1 tablet by mouth every 4 (four) hours as needed. 12/15/20   Maudie Flakes, MD  ipratropium-albuterol (DUONEB) 0.5-2.5 (3) MG/3ML SOLN Take 3 mLs by nebulization every 6 (six) hours as needed (sob/wheezing).  08/24/20   [provider]  isosorbide mononitrate (IMDUR) 60 MG 24 hr tablet Take 1 tablet (60 mg total) by mouth daily. 05/23/20   Elgergawy, Silver Huguenin, MD  levalbuterol Penne Lash) 0.63 MG/3ML nebulizer solution Take 0.63 mg by nebulization every 6 (six) hours as needed for wheezing or shortness of breath. Inhale 0.63mg  once daily.  May take 0.63mg  every six hours as needed COPD.    [provider]  magnesium oxide (MAG-OX) 400 MG tablet Take 1 tablet (400 mg total) by mouth 2 (two) times daily. 01/04/20   Angiulli, Lavon Paganini, PA-C  metroNIDAZOLE (FLAGYL) 500 MG tablet Take 1 tablet (500 mg total) by mouth 3 (three) times daily for 7 days. 12/15/20 12/22/20  Maudie Flakes, MD  mycophenolate (MYFORTIC) 180 MG EC tablet Take 180 mg by mouth 2 (two) times daily.    [provider]  ondansetron (ZOFRAN ODT) 4 MG disintegrating tablet Take 1 tablet (4 mg total) by mouth every 8 (eight) hours as needed for nausea or vomiting. 12/15/20   Maudie Flakes, MD  pantoprazole (PROTONIX) 40 MG tablet Take 1 tablet (40 mg total) by mouth 2 (two) times daily. 01/04/20   Angiulli, Lavon Paganini, PA-C  predniSONE (DELTASONE) 10 MG tablet 40mg X3 days, 30mg  X3 days, 20mg  X3 days, 10mg X3 days, then stop. 12/06/20   Freddi Starr, MD  PROGRAF 1 MG capsule Take 1 mg by mouth 2 (two) times daily.  03/22/19   [provider]  promethazine (PHENERGAN) 25 MG  tablet Take 1 tablet (25 mg total) by mouth every 8 (eight) hours as needed for up to 15 doses for nausea or vomiting. 09/02/20   Curatolo, Adam, DO  TRAVATAN Z 0.004 % SOLN ophthalmic solution Place 1 drop into both eyes at bedtime. 07/03/16   [provider]  valACYclovir (VALTREX) 500 MG tablet Take 500 mg by mouth 2 (two) times daily.    [provider]  verapamil (CALAN-SR)  240 MG CR tablet Take 240 mg by mouth daily.    [provider]    Allergies    Ardyth Harps [iron dextran], Pentamidine, Budesonide-formoterol fumarate, Bupropion, Crestor [rosuvastatin], Duloxetine hcl, Erythromycin [erythromycin], Iohexol, Oxycodone, Pravastatin sodium, Erythromycin, and Ultram [tramadol hcl]  Review of Systems   Review of Systems  Constitutional:       Per HPI, otherwise negative  HENT:       Per HPI, otherwise negative  Respiratory:       Per HPI, otherwise negative  Cardiovascular:       Per HPI, otherwise negative  Gastrointestinal: Positive for abdominal pain, nausea and vomiting.  Endocrine:       Negative aside from HPI  Genitourinary:       Neg aside from HPI   Musculoskeletal:       Per HPI, otherwise negative  Skin: Negative.   Allergic/Immunologic: Positive for immunocompromised state.  Neurological: Negative for syncope.    Physical Exam Updated Vital Signs BP 137/71 (BP Location: Right Arm)   Pulse 100   Temp 98.1 F (36.7 C) (Oral)   Resp (!) 29   SpO2 100%   Physical Exam Vitals and nursing note reviewed.  Constitutional:      Appearance: She is well-developed and well-nourished. She is obese. She is ill-appearing.  HENT:     Head: Normocephalic and atraumatic.  Eyes:     Extraocular Movements: EOM normal.     Conjunctiva/sclera: Conjunctivae normal.  Cardiovascular:     Rate and Rhythm: Normal rate and regular rhythm.  Pulmonary:     Effort: Pulmonary effort is normal. No respiratory distress.     Breath sounds: Normal breath sounds.  No stridor.  Abdominal:     General: There is no distension.     Tenderness: There is abdominal tenderness in the periumbilical area.  Musculoskeletal:        General: No edema.       Arms:  Skin:    General: Skin is warm and dry.  Neurological:     Mental Status: She is alert and oriented to person, place, and time.     Cranial Nerves: No cranial nerve deficit.  Psychiatric:        Mood and Affect: Mood and affect normal.     ED Results / Procedures / Treatments   Labs (all labs ordered are listed, but only abnormal results are displayed) Labs Reviewed  LIPASE, BLOOD  COMPREHENSIVE METABOLIC PANEL  CBC  URINALYSIS, ROUTINE W REFLEX MICROSCOPIC  BRAIN NATRIURETIC PEPTIDE    EKG None  Radiology CT ABDOMEN PELVIS WO CONTRAST  Result Date: 12/15/2020 CLINICAL DATA:  Fever, flank pain, status post renal transplantation. Low back pain. EXAM: CT ABDOMEN AND PELVIS WITHOUT CONTRAST CT LUMBAR SPINE WITHOUT CONTRAST TECHNIQUE: Multidetector CT imaging of the abdomen and pelvis was performed following the standard protocol without IV contrast. Subsequently, dedicated CT imaging of the lumbar spine was performed without contrast administration. Coronal and sagittal reformats were created. CT dose reduction techniques including automated exposure control and iterative reconstruction were utilized where possible. COMPARISON:  09/04/2020 FINDINGS: Lower chest: Mild bibasilar atelectasis. Trace right pleural effusion. Cardiac size within normal limits. Trace pericardial fluid is likely physiologic. 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 Dominant cyst within the left hepatic lobe is unchanged measuring 3.2 cm. Liver size within normal limits. Progressive mild intrahepatic biliary ductal dilation within segment 3 of the liver  likely related to multiple central cysts within the left hepatic lobe.  Stable mild peripheral biliary ductal dilation within segment 7 of the liver. No extrahepatic biliary ductal dilation. Gallbladder unremarkable. Pancreas: Unremarkable Spleen: Unremarkable Adrenals/Urinary Tract: The adrenal glands are unremarkable. The native kidneys are normal in position and are enlarged with innumerable cortical cysts identified in keeping with changes of polycystic kidney disease. More solid-appearing 3.2 cm rounded lesion within the lower pole of the right kidney, axial image # 50/2, appears smaller than on prior examination and likely represents a hyperdense renal cyst. No hydronephrosis. 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 Normal size and cortical thickness. No hydronephrosis. No intrarenal calcifications. Evaluation of the pelvis is limited by streak artifact from left total hip arthroplasty. The visualized bladder is decompressed. A a punctate focus of nondependent gas within the bladder lumen is nonspecific, possibly related to recent catheterization. Stomach/Bowel: Moderate to severe sigmoid diverticulosis. There is superimposed pericolonic inflammatory change identified within the mid sigmoid colon in keeping with changes of mild sigmoid diverticulitis. No free intraperitoneal gas or loculated intra-abdominal fluid collections. No evidence of obstruction. The stomach, small bowel, and large bowel are otherwise unremarkable. Appendix normal. No free intraperitoneal fluid. Vascular/Lymphatic: Moderate aortoiliac atherosclerotic calcification. No aortic aneurysm. No pathologic adenopathy within the abdomen and pelvis. Reproductive: Uterus and bilateral adnexa are unremarkable. Other: Small fat containing umbilical hernia. Broad-based right flank incisional hernia containing unremarkable loops of large and small bowel as well as the appendix. 2.6 cm left inguinal soft tissue nodule is again identified and is unchanged from prior examination, indeterminate.  Musculoskeletal: Left total hip arthroplasty has been performed. Chronic fractures of the sacral ala bilaterally are again identified. No acute bone abnormality. Sagittal and coronal reformats of the lumbar spine demonstrate normal lumbar lordosis. No acute fracture or listhesis of the lumbar spine. Chronic angulated fracture deformity of S2 is again seen, unchanged from prior CT examination. Chronic bilateral sacral ale are fractures identified, also unchanged from prior examination. Vertebral body height and intervertebral disc heights are preserved. Review of the axial images demonstrates: T12-L1: Intervertebral disc height preserved. No significant canal stenosis. No neural foraminal narrowing. No significant facet arthrosis. L1-2: Disc height preserved. No significant canal stenosis. No neural foraminal narrowing. No significant facet arthrosis. L2-3: Disc height preserved. Spinal canal is widely patent. No significant neural foraminal narrowing. No significant facet arthrosis. L3-4: Disc height preserved. Mild broad-based posterior disc bulge. Mild hypertrophy of the lamina propria. Mild resultant central canal stenosis. No significant neural foraminal narrowing. L4-5: Disc height preserved. Mild broad-based posterior disc bulge. Moderate bilateral facet arthrosis. Mild hypertrophy of the lamina propria. Moderate resultant central canal stenosis. No significant neural foraminal narrowing. Possible impingement of the traversing L5 nerve roots bilaterally within the lateral recess. This is not well assessed on this examination. L5-S1: Disc height preserved. Spinal canal widely patent. No significant neural foraminal narrowing. Mild bilateral facet arthrosis. IMPRESSION: Innumerable bilateral renal cortical cysts and multiple hepatic cysts in keeping with age is of autosomal dominant polycystic kidney disease. Normal appearance of the a right lower quadrant transplant kidney. Mild, uncomplicated sigmoid  diverticulitis. Background moderate to severe sigmoid diverticulosis. Chronic bilateral sacral ala and angulated S2 fractures. No acute fracture or listhesis of the lumbar spine. Aortic Atherosclerosis (ICD10-I70.0). Electronically Signed   By: Fidela Salisbury MD   On: 12/15/2020 00:50   CT L-SPINE NO CHARGE  Result Date: 12/15/2020 CLINICAL DATA:  Fever, flank pain, status post renal transplantation. Low back pain. EXAM:  CT ABDOMEN AND PELVIS WITHOUT CONTRAST CT LUMBAR SPINE WITHOUT CONTRAST TECHNIQUE: Multidetector CT imaging of the abdomen and pelvis was performed following the standard protocol without IV contrast. Subsequently, dedicated CT imaging of the lumbar spine was performed without contrast administration. Coronal and sagittal reformats were created. CT dose reduction techniques including automated exposure control and iterative reconstruction were utilized where possible. COMPARISON:  09/04/2020 FINDINGS: Lower chest: Mild bibasilar atelectasis. Trace right pleural effusion. Cardiac size within normal limits. Trace pericardial fluid is likely physiologic. 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 Dominant cyst within the left hepatic lobe is unchanged measuring 3.2 cm. Liver size within normal limits. Progressive mild intrahepatic biliary ductal dilation within segment 3 of the liver likely related to multiple central cysts within the left hepatic lobe. Stable mild peripheral biliary ductal dilation within segment 7 of the liver. No extrahepatic biliary ductal dilation. Gallbladder unremarkable. Pancreas: Unremarkable Spleen: Unremarkable Adrenals/Urinary Tract: The adrenal glands are unremarkable. The native kidneys are normal in position and are enlarged with innumerable cortical cysts identified in keeping with changes of polycystic kidney disease. More solid-appearing 3.2 cm rounded lesion within the  lower pole of the right kidney, axial image # 50/2, appears smaller than on prior examination and likely represents a hyperdense renal cyst. No hydronephrosis. 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 Normal size and cortical thickness. No hydronephrosis. No intrarenal calcifications. Evaluation of the pelvis is limited by streak artifact from left total hip arthroplasty. The visualized bladder is decompressed. A a punctate focus of nondependent gas within the bladder lumen is nonspecific, possibly related to recent catheterization. Stomach/Bowel: Moderate to severe sigmoid diverticulosis. There is superimposed pericolonic inflammatory change identified within the mid sigmoid colon in keeping with changes of mild sigmoid diverticulitis. No free intraperitoneal gas or loculated intra-abdominal fluid collections. No evidence of obstruction. The stomach, small bowel, and large bowel are otherwise unremarkable. Appendix normal. No free intraperitoneal fluid. Vascular/Lymphatic: Moderate aortoiliac atherosclerotic calcification. No aortic aneurysm. No pathologic adenopathy within the abdomen and pelvis. Reproductive: Uterus and bilateral adnexa are unremarkable. Other: Small fat containing umbilical hernia. Broad-based right flank incisional hernia containing unremarkable loops of large and small bowel as well as the appendix. 2.6 cm left inguinal soft tissue nodule is again identified and is unchanged from prior examination, indeterminate. Musculoskeletal: Left total hip arthroplasty has been performed. Chronic fractures of the sacral ala bilaterally are again identified. No acute bone abnormality. Sagittal and coronal reformats of the lumbar spine demonstrate normal lumbar lordosis. No acute fracture or listhesis of the lumbar spine. Chronic angulated fracture deformity of S2 is again seen, unchanged from prior CT examination. Chronic bilateral sacral ale are fractures identified, also unchanged from  prior examination. Vertebral body height and intervertebral disc heights are preserved. Review of the axial images demonstrates: T12-L1: Intervertebral disc height preserved. No significant canal stenosis. No neural foraminal narrowing. No significant facet arthrosis. L1-2: Disc height preserved. No significant canal stenosis. No neural foraminal narrowing. No significant facet arthrosis. L2-3: Disc height preserved. Spinal canal is widely patent. No significant neural foraminal narrowing. No significant facet arthrosis. L3-4: Disc height preserved. Mild broad-based posterior disc bulge. Mild hypertrophy of the lamina propria. Mild resultant central canal stenosis. No significant neural foraminal narrowing. L4-5: Disc height preserved. Mild broad-based posterior disc bulge. Moderate bilateral facet arthrosis. Mild hypertrophy of the lamina propria. Moderate resultant central canal stenosis. No significant neural foraminal narrowing. Possible impingement of the  traversing L5 nerve roots bilaterally within the lateral recess. This is not well assessed on this examination. L5-S1: Disc height preserved. Spinal canal widely patent. No significant neural foraminal narrowing. Mild bilateral facet arthrosis. IMPRESSION: Innumerable bilateral renal cortical cysts and multiple hepatic cysts in keeping with age is of autosomal dominant polycystic kidney disease. Normal appearance of the a right lower quadrant transplant kidney. Mild, uncomplicated sigmoid diverticulitis. Background moderate to severe sigmoid diverticulosis. Chronic bilateral sacral ala and angulated S2 fractures. No acute fracture or listhesis of the lumbar spine. Aortic Atherosclerosis (ICD10-I70.0). Electronically Signed   By: Fidela Salisbury MD   On: 12/15/2020 00:50   DG Chest Port 1 View  Result Date: 12/15/2020 CLINICAL DATA:  Fever and flank pain. Nausea. Previous kidney transplant. EXAM: PORTABLE CHEST 1 VIEW COMPARISON:  11/13/2020 FINDINGS:  Shallow inspiration with atelectasis in the lung bases. Mild cardiac enlargement. No vascular congestion or edema. Blunting of the costophrenic angles suggesting small effusions. No pneumothorax. IMPRESSION: Shallow inspiration with atelectasis in the lung bases. Small bilateral pleural effusions. Electronically Signed   By: Lucienne Capers M.D.   On: 12/15/2020 00:14    Procedures Procedures (including critical care time)  Medications Ordered in ED Medications  ciprofloxacin (CIPRO) IVPB 400 mg (has no administration in time range)  metroNIDAZOLE (FLAGYL) IVPB 500 mg (has no administration in time range)  sodium chloride 0.9 % bolus 1,000 mL (has no administration in time range)  ondansetron (ZOFRAN) injection 4 mg (has no administration in time range)  fentaNYL (SUBLIMAZE) injection 50 mcg (has no administration in time range)    ED Course  I have reviewed the triage vital signs and the nursing notes.  Pertinent labs & imaging results that were available during my care of the patient were reviewed by me and considered in my medical decision making (see chart for details).     After the initial evaluation reviewed the patient's chart including evaluation from visit 2 days ago, CT scans included. Patient did not have acute diverticulitis. Now with concern for persistency/worsening of her abdominal pain, inability to take her medication she was started on fluid resuscitation, received IV antibiotics, analgesics, antiemetics, was placed on continuous monitoring. Pulse oximetry 97% room air unremarkable With consideration of progression, patient had labs ordered, as above resuscitation started.  2:46 PM Patient continues to complain of nausea, has mild tachycardia. I have discussed her results with her, and subsequently with our internal medicine colleague. Patient's labs notable for leukocytosis where there was none 2 days ago.  Patient's x-ray suggest mild worsening vascular congestion,  though she has no new hypoxia. Given her prior pleural effusions, BNP was ordered, and that value today is consistent with her history of heart failure Given the patient's need for admission, IV antibiotics, fluid resuscitation will require judicious administration. Though she has improved somewhat given her inability to tolerate oral antibiotics, fluids, food, today's abnormal labs as above, known diverticulitis, she was admitted for further monitoring, management. MDM Rules/Calculators/A&P MDM Number of Diagnoses or Management Options Acute diverticulitis: established, worsening Bilious vomiting with nausea: established, worsening   Amount and/or Complexity of Data Reviewed Clinical lab tests: reviewed Tests in the radiology section of CPT: reviewed Tests in the medicine section of CPT: reviewed Decide to obtain previous medical records or to obtain history from someone other than the patient: yes Obtain history from someone other than the patient: yes Review and summarize past medical records: yes Discuss the patient with other providers: yes Independent visualization of images,  tracings, or specimens: yes  Risk of Complications, Morbidity, and/or Mortality Presenting problems: high Diagnostic procedures: high Management options: high  Critical Care Total time providing critical care: < 30 minutes  Patient Progress Patient progress: stable    Final Clinical Impression(s) / ED Diagnoses Final diagnoses:  Acute diverticulitis  Bilious vomiting with nausea      Carmin Muskrat, MD 12/16/20 1449

## 2020-12-16 NOTE — ED Notes (Signed)
Floor coverage MD paged about CBG of 54.

## 2020-12-16 NOTE — H&P (Addendum)
History and Physical    ERCIA CRISAFULLI OAC:[id number] DOB: 03-22-1956 DOA: 12/16/2020  PCP: [name], MD   Patient coming from: Home   Chief Complaint: Persistent nausea vomiting and abdominal pain since 12/13/2020  HPI: April [name] is a 65 y.o. 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  She presented to the ED 12/14/2020 with flank pain and fever 101.3.  CT abdomen revealing diverticulitis.  Patient discharged on Cipro/Flagyl. However reports that due to nausea has been unable to take her medications including Cipro Flagyl as well as other transplant medications since Saturday, 12/14/2020.  Intermittent left upper quadrant pain that is nonradiating and without any relieving or remitting factors.  He denies any fever or chills.   ED Course:  Vital signs with blood pressure 127/78, pulse 103, RR 28, SPO2 97% on 2 L nasal cannula.  -Labs with: proBNP 540-->2872 -Sodium 134, potassium 4.2, bicarb 19, anion gap 17, BUN 39, creatinine 1.33 -Albumin 3.3, AST 21, ALT 15, ALP 66, total bili 3.1, lipase 21 -WBC 9.6--> 20.8, Hb 12, platelets 181  -Covid PCR negative.  Urine with protein otherwise unremarkable. -CXR with slight vascular congestion and cardiomegaly.   Review of Systems: As per HPI otherwise 10 point review of systems negative.    Past Medical History:  Diagnosis Date  . Arthritis   . Asthma   . CHF (congestive heart failure) ([place])   . COPD (chronic obstructive pulmonary disease) ([place])   . Depression   . Essential hypertension 02/08/2020  . GERD (gastroesophageal reflux disease)   . Hyperlipidemia   . Hyperparathyroidism   . Polycystic kidney   . PONV (postoperative nausea and vomiting)     Past Surgical History:  Procedure Laterality Date  . AV FISTULA PLACEMENT  10-21-2010   left Brachiocephalic  AVF  . BILATERAL OOPHORECTOMY    . BIOPSY  05/19/2020   Procedure: BIOPSY;  Surgeon: Carol Ada, MD;  Location: [facility] ENDOSCOPY;  Service: Endoscopy;;  . CARPAL TUNNEL RELEASE  2000  . CESAREAN SECTION    . COLONOSCOPY WITH PROPOFOL N/A 05/19/2020   Procedure: COLONOSCOPY WITH PROPOFOL;  Surgeon: Carol Ada, MD;  Location: [place];  Service: Endoscopy;  Laterality: N/A;  . ENTEROSCOPY N/A 05/19/2020   Procedure: ENTEROSCOPY;  Surgeon: Carol Ada, MD;  Location: [place];  Service: Endoscopy;  Laterality: N/A;  . INSERTION OF DIALYSIS CATHETER  03/31/2012   Procedure: INSERTION OF DIALYSIS CATHETER;  Surgeon: Angelia Mould, MD;  Location: [place];  Service: Vascular;  Laterality: N/A;  insertion of dialysis catheter right internal jugular vein  . 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 ORIF TIBIA FRACTURE Left 12/23/2018   Procedure: OPEN REDUCTION INTERNAL FIXATION (ORIF) TIBIA FRACTURE;  Surgeon: Shona Needles, MD;  Location: [place];  Service: Orthopedics;  Laterality: Left;  . ORIF TIBIA PLATEAU Right 12/23/2018   Procedure: OPEN REDUCTION INTERNAL FIXATION (ORIF) TIBIAL PLATEAU;  Surgeon: Shona Needles, MD;  Location: [place];  Service: Orthopedics;  Laterality: Right;  . ORIF WRIST FRACTURE Left 12/08/2019   Procedure: OPEN REDUCTION INTERNAL FIXATION (ORIF) WRIST FRACTURE, REPAIR LACERATION LEFT FOREARM;  Surgeon: Dorna Leitz, MD;  Location: [facility];  Service: Orthopedics;  Laterality: Left;  . TONSILLECTOMY  1967  . TOTAL HIP ARTHROPLASTY Left 12/08/2019   Procedure: TOTAL HIP ARTHROPLASTY ANTERIOR APPROACH;  Surgeon: Dorna Leitz, MD;  Location: [facility];  Service: Orthopedics;  Laterality: Left;  . TUBAL LIGATION  2010     reports that she quit smoking about 22 years ago. Her smoking use included cigarettes. She has a 3.75 pack-year smoking history. She has never used smokeless tobacco. She reports that she does not drink alcohol and does not use drugs.  Allergies   Allergen Reactions  . Infed [Iron Dextran] Other (See Comments)    Chest tightness  . Pentamidine Itching, Shortness Of Breath and Swelling  . Budesonide-Formoterol Fumarate     Other reaction(s): hoarseness  . Bupropion     Other reaction(s): exacerbated depression  . Crestor [Rosuvastatin]     Other reaction(s): body aches, swelling  . Duloxetine Hcl     Other reaction(s): GI SE, weepy, worsening fatigue, foggy  . Erythromycin [Erythromycin] Other (See Comments)    Mouth Ulcers  . Iohexol Other (See Comments)    Per patient "she has had a kidney transplant and should never have contrast"  . Oxycodone Nausea Only and Nausea And Vomiting  . Pravastatin Sodium     Other reaction(s): myalgias  . Erythromycin Rash    Causes breakout in mouth  . Ultram [Tramadol Hcl] Anxiety    Family History  Problem Relation Age of Onset  . Hypertension Mother   . Heart disease Father        CABG history  . Polycystic kidney disease Father   . Polycystic kidney disease Brother      Prior to Admission medications   Medication Sig Start Date End Date Taking? Authorizing Provider  acetaminophen (TYLENOL) 325 MG tablet Take 2 tablets (650 mg total) by mouth every 6 (six) hours as needed for mild pain (or Fever >/= 101). 05/22/20  Yes Elgergawy, Silver Huguenin, MD  albuterol (VENTOLIN HFA) 108 (90 Base) MCG/ACT inhaler Inhale 2 puffs into the lungs every 4 (four) hours as needed for wheezing or shortness of breath. 01/04/20  Yes Angiulli, Lavon Paganini, PA-C  allopurinol (ZYLOPRIM) 100 MG tablet Take 100 mg by mouth daily. 11/29/20  Yes [provider]  amoxicillin (AMOXIL) 500 MG capsule Take 1,000 mg by mouth See admin instructions. 1000mg  twice daily as needed for dentist 12/09/20  Yes [provider]  brimonidine (ALPHAGAN P) 0.1 % SOLN Place 1 drop into both eyes 2 (two) times daily.    Yes [provider]  Budeson-Glycopyrrol-Formoterol (BREZTRI AEROSPHERE) 160-9-4.8 MCG/ACT AERO  Inhale 2 puffs into the lungs 2 (two) times daily. 08/20/20  Yes Martyn Ehrich, NP  cholecalciferol (VITAMIN D3) 25 MCG (1000 UNIT) tablet Take 1 tablet (1,000 Units total) by mouth daily. 01/04/20  Yes Angiulli, Lavon Paganini, PA-C  cyanocobalamin 1000 MCG tablet Take 1,000 mcg by mouth daily.   Yes [provider]  cycloSPORINE (RESTASIS) 0.05 % ophthalmic emulsion Place 1 drop into both eyes 2 (two) times daily. 01/04/20  Yes Angiulli, Lavon Paganini, PA-C  dorzolamide (TRUSOPT) 2 % ophthalmic solution Place 1 drop into both eyes daily. 06/25/19  Yes [provider]  ferrous sulfate 325 (65 FE) MG tablet Take 325 mg by mouth daily with breakfast.   Yes [provider]  FLUoxetine (PROZAC) 40 MG capsule Take 1 capsule (40 mg total) by mouth daily. 01/04/20  Yes Angiulli, Lavon Paganini, PA-C  furosemide (LASIX) 80 MG tablet Take 80 mg by mouth daily. 10/30/20  Yes [provider]  gabapentin (NEURONTIN) 100 MG capsule Take 200 mg by mouth 2 (two) times daily.   Yes  [provider]  guaiFENesin (MUCINEX) 600 MG 12 hr tablet Take 600 mg by mouth 2 (two) times daily.   Yes [provider]  ipratropium-albuterol (DUONEB) 0.5-2.5 (3) MG/3ML SOLN Take 3 mLs by nebulization every 6 (six) hours as needed (sob/wheezing).  08/24/20  Yes [provider]  isosorbide mononitrate (IMDUR) 60 MG 24 hr tablet Take 1 tablet (60 mg total) by mouth daily. 05/23/20  Yes Elgergawy, Silver Huguenin, MD  magnesium oxide (MAG-OX) 400 MG tablet Take 1 tablet (400 mg total) by mouth 2 (two) times daily. 01/04/20  Yes Angiulli, Lavon Paganini, PA-C  mycophenolate (MYFORTIC) 180 MG EC tablet Take 180 mg by mouth 2 (two) times daily.   Yes [provider]  pantoprazole (PROTONIX) 40 MG tablet Take 1 tablet (40 mg total) by mouth 2 (two) times daily. 01/04/20  Yes Angiulli, Lavon Paganini, PA-C  predniSONE (DELTASONE) 10 MG tablet 40mg X3 days, 30mg  X3 days, 20mg  X3 days, 10mg X3 days, then  stop. Patient taking differently: Take 10-40 mg by mouth See admin instructions. 40mg X3 days, 30mg  X3 days, 20mg  X3 days, 10mg X3 days, then stop. 12/06/20  Yes Freddi Starr, MD  PROGRAF 1 MG capsule Take 1 mg by mouth 2 (two) times daily.  03/22/19  Yes [provider]  promethazine (PHENERGAN) 25 MG tablet Take 1 tablet (25 mg total) by mouth every 8 (eight) hours as needed for up to 15 doses for nausea or vomiting. 09/02/20  Yes Curatolo, Adam, DO  TRAVATAN Z 0.004 % SOLN ophthalmic solution Place 1 drop into both eyes at bedtime. 07/03/16  Yes [provider]  valACYclovir (VALTREX) 500 MG tablet Take 500 mg by mouth 2 (two) times daily.   Yes [provider]  verapamil (CALAN-SR) 240 MG CR tablet Take 240 mg by mouth daily.   Yes [provider]  amoxicillin-clavulanate (AUGMENTIN) 875-125 MG tablet Take 1 tablet by mouth 2 (two) times daily. 12/06/20   Freddi Starr, MD  ciprofloxacin (CIPRO) 500 MG tablet Take 1 tablet (500 mg total) by mouth 2 (two) times daily for 7 days. Patient not taking: Reported on 12/16/2020 12/15/20 12/22/20  Maudie Flakes, MD  HYDROcodone-acetaminophen (NORCO/VICODIN) 5-325 MG tablet Take 1 tablet by mouth every 4 (four) hours as needed. Patient not taking: Reported on 12/16/2020 12/15/20   Maudie Flakes, MD  metroNIDAZOLE (FLAGYL) 500 MG tablet Take 1 tablet (500 mg total) by mouth 3 (three) times daily for 7 days. Patient not taking: Reported on 12/16/2020 12/15/20 12/22/20  Maudie Flakes, MD  ondansetron (ZOFRAN ODT) 4 MG disintegrating tablet Take 1 tablet (4 mg total) by mouth every 8 (eight) hours as needed for nausea or vomiting. Patient not taking: Reported on 12/16/2020 12/15/20   Maudie Flakes, MD    Physical Exam: Vitals:   12/16/20 1145 12/16/20 1230 12/16/20 1315 12/16/20 1400  BP: 137/75 (!) 148/84 (!) 146/71 (!) 147/78  Pulse: 98 98 99 (!) 101  Resp: (!) 26 (!) 26 20 (!) 28  Temp:      TempSrc:       SpO2: 100% 99% 99% 97%    Constitutional: NAD, calm, comfortable  Sick appearing female.    Vitals:   12/16/20 1145 12/16/20 1230 12/16/20 1315 12/16/20 1400  BP: 137/75 (!) 148/84 (!) 146/71 (!) 147/78  Pulse: 98 98 99 (!) 101  Resp: (!) 26 (!) 26 20 (!) 28  Temp:      TempSrc:      SpO2:  100% 99% 99% 97%   Eyes: PERRL, lids and conjunctivae normal ENMT: Mucous membranes are moist. Posterior pharynx clear of any exudate or lesions.Normal dentition.  Neck: normal, supple, no masses, no thyromegaly  JVP at 12 cm   Respiratory: clear to auscultation bilaterally, no wheezing, no crackles. Normal respiratory effort. No accessory muscle use.  Cardiovascular: Regular rate and rhythm, 3/6 systolic murmur over tricuspid vs mitral area   Abdomen: tenderness in LLQ with guarding and rebound.   Musculoskeletal: no clubbing / cyanosis. No joint deformity upper and lower extremities. Good ROM, no contractures. Normal muscle tone.  Skin: no rashes, lesions, ulcers. No induration  Neurologic: CN 2-12 grossly intact. Sensation intact, DTR normal. Strength 5/5 in all 4.  Psychiatric: Normal judgment and insight. Alert and oriented x 3. Normal mood.   Labs on Admission: I have personally reviewed following labs and imaging studies  CBC: Recent Labs  Lab 12/14/20 2242 12/16/20 0952  WBC 9.6 20.8*  NEUTROABS 7.6  --   HGB 11.3* 12.2  HCT 37.3 39.3  MCV 103.3* 102.3*  PLT 187 716   Basic Metabolic Panel: Recent Labs  Lab 12/14/20 2242 12/16/20 0952  NA 136 134*  K 4.1 4.2  CL 98 98  CO2 26 19*  GLUCOSE 77 68*  BUN 49* 39*  CREATININE 1.16* 1.33*  CALCIUM 9.7 9.6   GFR: Estimated Creatinine Clearance: 40.3 mL/min (A) (by C-G formula based on SCr of 1.33 mg/dL (H)). Liver Function Tests: Recent Labs  Lab 12/14/20 2242 12/16/20 0952  AST 17 21  ALT 15 15  ALKPHOS 61 66  BILITOT 1.9* 3.1*  PROT 7.8 7.8  ALBUMIN 3.3* 3.3*   Recent Labs  Lab 12/16/20 0952  LIPASE  21   No results for input(s): AMMONIA in the last 168 hours. Coagulation Profile: Recent Labs  Lab 12/14/20 2242  INR 1.1   Urine analysis:    Component Value Date/Time   COLORURINE YELLOW 12/16/2020 1400   APPEARANCEUR CLEAR 12/16/2020 1400   LABSPEC 1.014 12/16/2020 1400   PHURINE 5.0 12/16/2020 1400   GLUCOSEU NEGATIVE 12/16/2020 1400   HGBUR MODERATE (A) 12/16/2020 1400   BILIRUBINUR NEGATIVE 12/16/2020 1400   KETONESUR 20 (A) 12/16/2020 1400   PROTEINUR 100 (A) 12/16/2020 1400   UROBILINOGEN 0.2 05/15/2013 0733   NITRITE NEGATIVE 12/16/2020 1400   LEUKOCYTESUR NEGATIVE 12/16/2020 1400    Radiological Exams on Admission: CT ABDOMEN PELVIS WO CONTRAST  Result Date: 12/15/2020 CLINICAL DATA:  Fever, flank pain, status post renal transplantation. Low back pain. EXAM: CT ABDOMEN AND PELVIS WITHOUT CONTRAST CT LUMBAR SPINE WITHOUT CONTRAST TECHNIQUE: Multidetector CT imaging of the abdomen and pelvis was performed following the standard protocol without IV contrast. Subsequently, dedicated CT imaging of the lumbar spine was performed without contrast administration. Coronal and sagittal reformats were created. CT dose reduction techniques including automated exposure control and iterative reconstruction were utilized where possible. COMPARISON:  09/04/2020 FINDINGS: Lower chest: Mild bibasilar atelectasis. Trace right pleural effusion. Cardiac size within normal limits. Trace pericardial fluid is likely physiologic. 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 Dominant cyst within the left hepatic lobe is unchanged measuring 3.2 cm. Liver size within normal limits. Progressive mild intrahepatic biliary ductal dilation within segment 3 of the liver likely related to multiple central cysts within the left hepatic lobe. Stable mild peripheral biliary ductal dilation within segment 7 of the liver. No  extrahepatic biliary ductal dilation.  Gallbladder unremarkable. Pancreas: Unremarkable Spleen: Unremarkable Adrenals/Urinary Tract: The adrenal glands are unremarkable. The native kidneys are normal in position and are enlarged with innumerable cortical cysts identified in keeping with changes of polycystic kidney disease. More solid-appearing 3.2 cm rounded lesion within the lower pole of the right kidney, axial image # 50/2, appears smaller than on prior examination and likely represents a hyperdense renal cyst. No hydronephrosis. 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 Normal size and cortical thickness. No hydronephrosis. No intrarenal calcifications. Evaluation of the pelvis is limited by streak artifact from left total hip arthroplasty. The visualized bladder is decompressed. A a punctate focus of nondependent gas within the bladder lumen is nonspecific, possibly related to recent catheterization. Stomach/Bowel: Moderate to severe sigmoid diverticulosis. There is superimposed pericolonic inflammatory change identified within the mid sigmoid colon in keeping with changes of mild sigmoid diverticulitis. No free intraperitoneal gas or loculated intra-abdominal fluid collections. No evidence of obstruction. The stomach, small bowel, and large bowel are otherwise unremarkable. Appendix normal. No free intraperitoneal fluid. Vascular/Lymphatic: Moderate aortoiliac atherosclerotic calcification. No aortic aneurysm. No pathologic adenopathy within the abdomen and pelvis. Reproductive: Uterus and bilateral adnexa are unremarkable. Other: Small fat containing umbilical hernia. Broad-based right flank incisional hernia containing unremarkable loops of large and small bowel as well as the appendix. 2.6 cm left inguinal soft tissue nodule is again identified and is unchanged from prior examination, indeterminate. Musculoskeletal: Left total hip arthroplasty has been performed. Chronic fractures of the  sacral ala bilaterally are again identified. No acute bone abnormality. Sagittal and coronal reformats of the lumbar spine demonstrate normal lumbar lordosis. No acute fracture or listhesis of the lumbar spine. Chronic angulated fracture deformity of S2 is again seen, unchanged from prior CT examination. Chronic bilateral sacral ale are fractures identified, also unchanged from prior examination. Vertebral body height and intervertebral disc heights are preserved. Review of the axial images demonstrates: T12-L1: Intervertebral disc height preserved. No significant canal stenosis. No neural foraminal narrowing. No significant facet arthrosis. L1-2: Disc height preserved. No significant canal stenosis. No neural foraminal narrowing. No significant facet arthrosis. L2-3: Disc height preserved. Spinal canal is widely patent. No significant neural foraminal narrowing. No significant facet arthrosis. L3-4: Disc height preserved. Mild broad-based posterior disc bulge. Mild hypertrophy of the lamina propria. Mild resultant central canal stenosis. No significant neural foraminal narrowing. L4-5: Disc height preserved. Mild broad-based posterior disc bulge. Moderate bilateral facet arthrosis. Mild hypertrophy of the lamina propria. Moderate resultant central canal stenosis. No significant neural foraminal narrowing. Possible impingement of the traversing L5 nerve roots bilaterally within the lateral recess. This is not well assessed on this examination. L5-S1: Disc height preserved. Spinal canal widely patent. No significant neural foraminal narrowing. Mild bilateral facet arthrosis. IMPRESSION: Innumerable bilateral renal cortical cysts and multiple hepatic cysts in keeping with age is of autosomal dominant polycystic kidney disease. Normal appearance of the a right lower quadrant transplant kidney. Mild, uncomplicated sigmoid diverticulitis. Background moderate to severe sigmoid diverticulosis. Chronic bilateral sacral ala  and angulated S2 fractures. No acute fracture or listhesis of the lumbar spine. Aortic Atherosclerosis (ICD10-I70.0). Electronically Signed   By: Fidela Salisbury MD   On: 12/15/2020 00:50   CT L-SPINE NO CHARGE  Result Date: 12/15/2020 CLINICAL DATA:  Fever, flank pain, status post renal transplantation. Low back pain. EXAM: CT ABDOMEN AND PELVIS WITHOUT CONTRAST CT LUMBAR SPINE WITHOUT CONTRAST TECHNIQUE: Multidetector CT imaging of the abdomen and pelvis was performed following the standard protocol without IV contrast.  Subsequently, dedicated CT imaging of the lumbar spine was performed without contrast administration. Coronal and sagittal reformats were created. CT dose reduction techniques including automated exposure control and iterative reconstruction were utilized where possible. COMPARISON:  09/04/2020 FINDINGS: Lower chest: Mild bibasilar atelectasis. Trace right pleural effusion. Cardiac size within normal limits. Trace pericardial fluid is likely physiologic. 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 Dominant cyst within the left hepatic lobe is unchanged measuring 3.2 cm. Liver size within normal limits. Progressive mild intrahepatic biliary ductal dilation within segment 3 of the liver likely related to multiple central cysts within the left hepatic lobe. Stable mild peripheral biliary ductal dilation within segment 7 of the liver. No extrahepatic biliary ductal dilation. Gallbladder unremarkable. Pancreas: Unremarkable Spleen: Unremarkable Adrenals/Urinary Tract: The adrenal glands are unremarkable. The native kidneys are normal in position and are enlarged with innumerable cortical cysts identified in keeping with changes of polycystic kidney disease. More solid-appearing 3.2 cm rounded lesion within the lower pole of the right kidney, axial image # 50/2, appears smaller than on prior examination and  likely represents a hyperdense renal cyst. No hydronephrosis. 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 Normal size and cortical thickness. No hydronephrosis. No intrarenal calcifications. Evaluation of the pelvis is limited by streak artifact from left total hip arthroplasty. The visualized bladder is decompressed. A a punctate focus of nondependent gas within the bladder lumen is nonspecific, possibly related to recent catheterization. Stomach/Bowel: Moderate to severe sigmoid diverticulosis. There is superimposed pericolonic inflammatory change identified within the mid sigmoid colon in keeping with changes of mild sigmoid diverticulitis. No free intraperitoneal gas or loculated intra-abdominal fluid collections. No evidence of obstruction. The stomach, small bowel, and large bowel are otherwise unremarkable. Appendix normal. No free intraperitoneal fluid. Vascular/Lymphatic: Moderate aortoiliac atherosclerotic calcification. No aortic aneurysm. No pathologic adenopathy within the abdomen and pelvis. Reproductive: Uterus and bilateral adnexa are unremarkable. Other: Small fat containing umbilical hernia. Broad-based right flank incisional hernia containing unremarkable loops of large and small bowel as well as the appendix. 2.6 cm left inguinal soft tissue nodule is again identified and is unchanged from prior examination, indeterminate. Musculoskeletal: Left total hip arthroplasty has been performed. Chronic fractures of the sacral ala bilaterally are again identified. No acute bone abnormality. Sagittal and coronal reformats of the lumbar spine demonstrate normal lumbar lordosis. No acute fracture or listhesis of the lumbar spine. Chronic angulated fracture deformity of S2 is again seen, unchanged from prior CT examination. Chronic bilateral sacral ale are fractures identified, also unchanged from prior examination. Vertebral body height and intervertebral disc heights are preserved. Review of  the axial images demonstrates: T12-L1: Intervertebral disc height preserved. No significant canal stenosis. No neural foraminal narrowing. No significant facet arthrosis. L1-2: Disc height preserved. No significant canal stenosis. No neural foraminal narrowing. No significant facet arthrosis. L2-3: Disc height preserved. Spinal canal is widely patent. No significant neural foraminal narrowing. No significant facet arthrosis. L3-4: Disc height preserved. Mild broad-based posterior disc bulge. Mild hypertrophy of the lamina propria. Mild resultant central canal stenosis. No significant neural foraminal narrowing. L4-5: Disc height preserved. Mild broad-based posterior disc bulge. Moderate bilateral facet arthrosis. Mild hypertrophy of the lamina propria. Moderate resultant central canal stenosis. No significant neural foraminal narrowing. Possible impingement of the traversing L5 nerve roots bilaterally within the lateral recess. This is not well assessed on this examination. L5-S1: Disc height preserved. Spinal canal widely patent. No significant neural foraminal  narrowing. Mild bilateral facet arthrosis. IMPRESSION: Innumerable bilateral renal cortical cysts and multiple hepatic cysts in keeping with age is of autosomal dominant polycystic kidney disease. Normal appearance of the a right lower quadrant transplant kidney. Mild, uncomplicated sigmoid diverticulitis. Background moderate to severe sigmoid diverticulosis. Chronic bilateral sacral ala and angulated S2 fractures. No acute fracture or listhesis of the lumbar spine. Aortic Atherosclerosis (ICD10-I70.0). Electronically Signed   By: Fidela Salisbury MD   On: 12/15/2020 00:50   DG Chest Port 1 View  Result Date: 12/16/2020 CLINICAL DATA:  Weakness, pleural effusions, LEFT lower quadrant abdominal pain, nausea, dry heaving, pleural effusions by prior radiograph EXAM: PORTABLE CHEST 1 VIEW COMPARISON:  Portable exam 1205 hours compared to 12/14/2020 FINDINGS:  Borderline enlargement of cardiac silhouette with slight vascular congestion. Mediastinal contours normal. Bibasilar atelectasis and tiny pleural effusions. No acute infiltrate/edema or pneumothorax. Bones demineralized. IMPRESSION: Enlargement of cardiac silhouette with pulmonary vascular congestion. Bibasilar atelectasis and tiny pleural effusions. Electronically Signed   By: Lavonia Dana M.D.   On: 12/16/2020 12:27   DG Chest Port 1 View  Result Date: 12/15/2020 CLINICAL DATA:  Fever and flank pain. Nausea. Previous kidney transplant. EXAM: PORTABLE CHEST 1 VIEW COMPARISON:  11/13/2020 FINDINGS: Shallow inspiration with atelectasis in the lung bases. Mild cardiac enlargement. No vascular congestion or edema. Blunting of the costophrenic angles suggesting small effusions. No pneumothorax. IMPRESSION: Shallow inspiration with atelectasis in the lung bases. Small bilateral pleural effusions. Electronically Signed   By: Lucienne Capers M.D.   On: 12/15/2020 00:14    Assessment/Plan Active Problems:   Sepsis ([place])   Sigmoid diverticulitis   Acute CHF (congestive heart failure) ([place])   GERD without esophagitis   Renal transplant recipient   Depression   #Colitis acute sigmoid -Lactate and BCx pending -IV Cipro and Flagyl.  Continue with clears.  Will consult general surgery. -We will avoid IV hydration in light of acute CHF.  #Acute CHF presumed diastolic  -Holding Lasix in light of poor PO intake and NPO status.  -Given Tricuspid/mitral murmur and marked increase in BNP, will need rpt TTE. -C/w Imdur.    #Chronic COPD -PRN duonebs.   #Renal transplant -Continue with CellCept and Prograf.  Will need Prograf levels checked by pharmacy given has not taken meds in 2-3 days.  #SVT -Continue verapamil.  DVT prophylaxis: Subcu heparin   code Status: Full code   Family Communication: None  Disposition Plan: Home versus SNF.   Consults called: General surgery Admission status:  Inpatient on telemetry.   Abanoub Hanken MD [facility]   If 7PM-7AM, please contact night-coverage [email or web address] Password [facility]  12/16/2020, 2:32 PM

## 2020-12-17 ENCOUNTER — Inpatient Hospital Stay (HOSPITAL_COMMUNITY): Payer: Medicare Other

## 2020-12-17 DIAGNOSIS — Z940 Kidney transplant status: Secondary | ICD-10-CM | POA: Diagnosis not present

## 2020-12-17 DIAGNOSIS — D696 Thrombocytopenia, unspecified: Secondary | ICD-10-CM

## 2020-12-17 DIAGNOSIS — D631 Anemia in chronic kidney disease: Secondary | ICD-10-CM

## 2020-12-17 DIAGNOSIS — K5732 Diverticulitis of large intestine without perforation or abscess without bleeding: Secondary | ICD-10-CM | POA: Diagnosis not present

## 2020-12-17 DIAGNOSIS — N189 Chronic kidney disease, unspecified: Secondary | ICD-10-CM

## 2020-12-17 DIAGNOSIS — I5033 Acute on chronic diastolic (congestive) heart failure: Secondary | ICD-10-CM | POA: Diagnosis not present

## 2020-12-17 DIAGNOSIS — R7989 Other specified abnormal findings of blood chemistry: Secondary | ICD-10-CM

## 2020-12-17 DIAGNOSIS — N179 Acute kidney failure, unspecified: Secondary | ICD-10-CM | POA: Diagnosis not present

## 2020-12-17 DIAGNOSIS — D72825 Bandemia: Secondary | ICD-10-CM

## 2020-12-17 DIAGNOSIS — I5031 Acute diastolic (congestive) heart failure: Secondary | ICD-10-CM | POA: Diagnosis not present

## 2020-12-17 DIAGNOSIS — I82531 Chronic embolism and thrombosis of right popliteal vein: Secondary | ICD-10-CM

## 2020-12-17 LAB — COMPREHENSIVE METABOLIC PANEL
ALT: 12 U/L (ref 0–44)
AST: 17 U/L (ref 15–41)
Albumin: 2.8 g/dL — ABNORMAL LOW (ref 3.5–5.0)
Alkaline Phosphatase: 58 U/L (ref 38–126)
Anion gap: 12 (ref 5–15)
BUN: 41 mg/dL — ABNORMAL HIGH (ref 8–23)
CO2: 21 mmol/L — ABNORMAL LOW (ref 22–32)
Calcium: 8.4 mg/dL — ABNORMAL LOW (ref 8.9–10.3)
Chloride: 100 mmol/L (ref 98–111)
Creatinine, Ser: 1.67 mg/dL — ABNORMAL HIGH (ref 0.44–1.00)
GFR, Estimated: 34 mL/min — ABNORMAL LOW (ref >60)
Glucose, Bld: 96 mg/dL (ref 70–99)
Potassium: 4.1 mmol/L (ref 3.5–5.1)
Sodium: 133 mmol/L — ABNORMAL LOW (ref 135–145)
Total Bilirubin: 2.2 mg/dL — ABNORMAL HIGH (ref 0.3–1.2)
Total Protein: 6.7 g/dL (ref 6.5–8.1)

## 2020-12-17 LAB — CBG MONITORING, ED
Glucose-Capillary: 121 mg/dL — ABNORMAL HIGH (ref 70–99)
Glucose-Capillary: 84 mg/dL (ref 70–99)
Glucose-Capillary: 88 mg/dL (ref 70–99)

## 2020-12-17 LAB — CBC WITH DIFFERENTIAL/PLATELET
Abs Immature Granulocytes: 0.21 10*3/uL — ABNORMAL HIGH (ref 0.00–0.07)
Basophils Absolute: 0 10*3/uL (ref 0.0–0.1)
Basophils Relative: 0 %
Eosinophils Absolute: 0.1 10*3/uL (ref 0.0–0.5)
Eosinophils Relative: 0 %
HCT: 31.4 % — ABNORMAL LOW (ref 36.0–46.0)
Hemoglobin: 9.5 g/dL — ABNORMAL LOW (ref 12.0–15.0)
Immature Granulocytes: 1 %
Lymphocytes Relative: 8 %
Lymphs Abs: 1.7 10*3/uL (ref 0.7–4.0)
MCH: 31.3 pg (ref 26.0–34.0)
MCHC: 30.3 g/dL (ref 30.0–36.0)
MCV: 103.3 fL — ABNORMAL HIGH (ref 80.0–100.0)
Monocytes Absolute: 1 10*3/uL (ref 0.1–1.0)
Monocytes Relative: 5 %
Neutro Abs: 17.2 10*3/uL — ABNORMAL HIGH (ref 1.7–7.7)
Neutrophils Relative %: 86 %
Platelets: 147 10*3/uL — ABNORMAL LOW (ref 150–400)
RBC: 3.04 MIL/uL — ABNORMAL LOW (ref 3.87–5.11)
RDW: 19.3 % — ABNORMAL HIGH (ref 11.5–15.5)
WBC: 20.1 10*3/uL — ABNORMAL HIGH (ref 4.0–10.5)
nRBC: 0 % (ref 0.0–0.2)

## 2020-12-17 LAB — ECHOCARDIOGRAM COMPLETE
Area-P 1/2: 2.6 cm2
S' Lateral: 2.6 cm

## 2020-12-17 LAB — HEPARIN LEVEL (UNFRACTIONATED): Heparin Unfractionated: <0.1 IU/mL — ABNORMAL LOW (ref 0.30–0.70)

## 2020-12-17 LAB — LACTIC ACID, PLASMA: Lactic Acid, Venous: 0.7 mmol/L (ref 0.5–1.9)

## 2020-12-17 MED ORDER — VITAMIN B-12 1000 MCG PO TABS
1000.0000 ug | ORAL_TABLET | Freq: Every day | ORAL | Status: DC
  Administered 2020-12-18 – 2020-12-30 (×13): 1000 ug via ORAL
  Filled 2020-12-17 (×14): qty 1

## 2020-12-17 MED ORDER — HEPARIN BOLUS VIA INFUSION
2000.0000 [IU] | Freq: Once | INTRAVENOUS | Status: AC
  Administered 2020-12-17: 2000 [IU] via INTRAVENOUS
  Filled 2020-12-17: qty 2000

## 2020-12-17 MED ORDER — BUDESONIDE 0.5 MG/2ML IN SUSP
0.5000 mg | Freq: Two times a day (BID) | RESPIRATORY_TRACT | Status: DC
  Administered 2020-12-17 – 2020-12-29 (×23): 0.5 mg via RESPIRATORY_TRACT
  Filled 2020-12-17 (×24): qty 2

## 2020-12-17 MED ORDER — ARFORMOTEROL TARTRATE 15 MCG/2ML IN NEBU
15.0000 ug | INHALATION_SOLUTION | Freq: Two times a day (BID) | RESPIRATORY_TRACT | Status: DC
  Administered 2020-12-17 – 2020-12-29 (×23): 15 ug via RESPIRATORY_TRACT
  Filled 2020-12-17 (×23): qty 2

## 2020-12-17 MED ORDER — BUDESON-GLYCOPYRROL-FORMOTEROL 160-9-4.8 MCG/ACT IN AERO: 2.0000 | INHALATION_SPRAY | Freq: Two times a day (BID) | RESPIRATORY_TRACT | Status: DC

## 2020-12-17 MED ORDER — UMECLIDINIUM BROMIDE 62.5 MCG/INH IN AEPB
1.0000 | INHALATION_SPRAY | Freq: Every day | RESPIRATORY_TRACT | Status: DC
  Administered 2020-12-18 – 2020-12-29 (×12): 1 via RESPIRATORY_TRACT
  Filled 2020-12-17 (×2): qty 7

## 2020-12-17 MED ORDER — HEPARIN (PORCINE) 25000 UT/250ML-% IV SOLN
1350.0000 [IU]/h | INTRAVENOUS | Status: DC
  Administered 2020-12-17: 1050 [IU]/h via INTRAVENOUS
  Filled 2020-12-17 (×3): qty 250

## 2020-12-17 MED ORDER — SODIUM CHLORIDE 0.9 % IV SOLN
3.0000 g | Freq: Four times a day (QID) | INTRAVENOUS | Status: DC
  Administered 2020-12-17 – 2020-12-18 (×3): 3 g via INTRAVENOUS
  Filled 2020-12-17: qty 3
  Filled 2020-12-17 (×2): qty 8
  Filled 2020-12-17: qty 3

## 2020-12-17 MED ORDER — HEPARIN BOLUS VIA INFUSION
1800.0000 [IU] | INTRAVENOUS | Status: AC
  Administered 2020-12-17: 1800 [IU] via INTRAVENOUS
  Filled 2020-12-17: qty 1800

## 2020-12-17 NOTE — Progress Notes (Addendum)
PROGRESS NOTE  April [name] LSL:[id number] DOB: [birth year]   PCP: [name], MD  Patient is from: Home.  Uses walker at baseline.  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 Royce Macadamia, diastolic CHF, MGUS, HTN, debility and recent ED visit for loose uncomplicated sigmoid diverticulitis on 12/14/2020 returning with LLQ pain, vomiting, shortness of breath and fever.  She was a started on Cipro and Flagyl 2 days prior but was unable to take due to nausea and vomiting.  In ED, slightly tachycardic and tachypneic.  Significant labs include WBC 20.8, bicarb 19, AG 17, Cr 1.33 (1.16 two days prior), BUN 39 and total bili 3.1.  proBNP Feb 15, 2871.  Lactic acid negative.  CXR with vascular congestion and cardiomegaly.  COVID-19 PCR negative.  General surgery consulted.  CT abdomen and pelvis, LE [place] and TTE ordered.  CT abdomen and pelvis showed mild uncomplicated sigmoid diverticulitis, polycystic kidney or liver disease as before and unremarkable appearance of right pelvic transplanted kidney. LE [place] consistent with age-indeterminate nonocclusive DVT in the right popliteal vein not noted on previous ultrasound in 04/2020. TTE with LVEF of 70 to 75%, G2 DD, normal RVSP.   The next day, patient had pleuritic chest pain or shortness of breath.  Empirically started on heparin drip.  VQ scan ordered..  Creatinine also trended up.  Nephrology consulted.  Subjective: Seen and examined earlier this morning.  She reports shortness of breath, orthopnea, edema and pleuritic chest pain.  She denies nausea, vomiting or abdominal pain.  Objective: Vitals:   12/17/20 0800 12/17/20 0930 12/17/20 1015 12/17/20 1145  BP: (!) 111/58 136/60 117/68 (!) 113/58   Pulse: 98 (!) 105 (!) 103 95  Resp: (!) 25 (!) 24 (!) 29 (!) 24  Temp:      TempSrc:      SpO2: 100% 100% 100% 99%   No intake or output data in the 24 hours ending 12/17/20 1251 There were no vitals filed for this visit.  Examination:  GENERAL: No apparent distress.  Nontoxic. HEENT: MMM.  Vision and hearing grossly intact.  NECK: Supple.  No apparent JVD.  RESP: 99% on 4 L.  No IWOB.  Bibasilar crackles.  No wheeze. CVS:  RRR. Heart sounds normal.  ABD/GI/GU: BS+. Abd soft, NTND.  MSK/EXT:  Moves extremities. No apparent deformity.  1+ pitting edema bilaterally SKIN: no apparent skin lesion or wound NEURO: Awake, alert and oriented appropriately.  No apparent focal neuro deficit. PSYCH: Calm. Normal affect.   Procedures:  None  Microbiology summarized: None  Assessment & Plan: Acute on chronic diastolic CHF: TTE with LVEF of 70 to 75%, G2 DD, normal RVSP.  On p.o. Lasix 80 mg daily at home.  Has not been able to tolerate this lately due to nausea vomiting.  Has cardinal symptoms including dyspnea, orthopnea and edema.  CXR consistent with CHF.  proBNP elevated (higher than baseline).  Not connected with cardiology.  Now with AKI -Consulted nephrology for guidance on diuretics -She is on Cardizem which might not be the best with CHF. 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 Followed by Dr. Royce Macadamia at [facility].  Recent Labs  10/06/20 2019 10/23/20 1329 10/24/20 0644 10/25/20 0629 10/26/20 0657 11/04/20 0952 11/13/20 1156 12/14/20 2242 12/16/20 0952 12/17/20 0425  BUN 27* 56* 60* 76* 74* 42* 51* 49* 39* 41*  CREATININE 0.90 1.17* 1.35* 1.65* 1.67* 1.28* 1.30* 1.16* 1.33* 1.67*  -Continue home immunosuppressant-pharmacy to adjust doses based on renal function -Change Zosyn to Unasyn -Nephrology consulted  Severe sepsis due to sigmoid diverticulitis: POA.  Meets criteria  with leukocytosis, tachycardia, tachypnea, hypotension and AKI -Change Zosyn to Unasyn in the setting of AKI -General surgery following.  Chronic COPD/chronic respiratory failure: On 2 L at baseline -Continue home LAMA/LABA/ICS -Continue as needed DuoNeb -Wean oxygen.  Minimum oxygen to keep saturation above 90%  Anemia of renal disease: Hgb at baseline.  Some transition from hemoconcentration to hemodilution. Recent Labs    10/06/20 2019 10/23/20 1329 10/24/20 0644 10/25/20 0629 10/26/20 0657 11/04/20 0952 11/13/20 1156 12/14/20 2242 12/16/20 0952 12/17/20 0425  HGB 9.2* 9.6* 9.1* 8.4* 8.1* 9.0* 9.2* 11.3* 12.2 9.5*  -Continue monitoring -Check anemia panel  Age-indeterminate right popliteal DVT: She reports pleuritic chest pain and shortness of breath raising concern for PE. -Start heparin pending V/Q scan  Debility/physical deconditioning: Uses walker at baseline. -PT/OT eval  Essential hypertension: Slightly soft blood pressures. -Continue home Cardizem given tachycardia -Holding Imdur-has already received morning dose today  GERD -Continue Protonix  Thrombocytopenia: Platelet 147.  Chronic. -Continue monitoring  Leukocytosis/bandemia: Likely due to use diverticulitis. -Continue monitoring  Hyponatremia: In the setting of renal failure. -Continue monitoring  Hypoglycemia without diabetes-likely due to poor p.o. intake.  Resolved.    DVT prophylaxis:  heparin bolus via infusion 1,800 Units Start: 12/17/20 1200  Code Status: Full code Family Communication: Updated patient's daughter over the phone. Level of care: Telemetry Status is: Inpatient  Remains inpatient appropriate because:Hemodynamically unstable, Ongoing diagnostic testing needed not appropriate for outpatient work up, IV treatments appropriate due to intensity of illness or inability to take PO and Inpatient level of care appropriate due to severity of illness   Dispo: The patient is from:  Home              Anticipated d/c is to: To be determined              Anticipated d/c date is: 3 days              Patient currently is not medically stable to d/c.   Difficult to place patient No       Consultants:  General surgery Nephrology   Sch Meds:  Scheduled Meds: . allopurinol  100 mg Oral Daily  . brimonidine  1 drop Both Eyes BID  . cycloSPORINE  1 drop Both Eyes BID  . dorzolamide  1 drop Both Eyes Daily  . ferrous sulfate  325 mg Oral Q breakfast  . FLUoxetine  40 mg Oral Daily  . gabapentin  200 mg Oral BID  . guaiFENesin  600 mg Oral BID  . heparin  1,800 Units Intravenous STAT  . isosorbide mononitrate  60 mg Oral Daily  . latanoprost  1 drop Both Eyes QHS  . magnesium oxide  400 mg Oral BID  . mycophenolate  180 mg Oral BID  . pantoprazole  40 mg Oral BID  . predniSONE  7.5 mg Oral Q breakfast  . tacrolimus  1 mg Oral BID  . valACYclovir  500 mg Oral BID  . verapamil  240 mg Oral Daily   Continuous Infusions: . ampicillin-sulbactam (UNASYN) IV    .  heparin     PRN Meds:.acetaminophen **OR** acetaminophen, bisacodyl, ipratropium-albuterol, ondansetron **OR** ondansetron (ZOFRAN) IV  Antimicrobials: Anti-infectives (From admission, onward)   Start     Dose/Rate Route Frequency Ordered Stop   12/17/20 1400  Ampicillin-Sulbactam (UNASYN) 3 g in sodium chloride 0.9 % 100 mL IVPB        3 g 200 mL/hr over 30 Minutes Intravenous Every 6 hours 12/17/20 1004     12/16/20 2200  ciprofloxacin (CIPRO) IVPB 400 mg  Status:  Discontinued        400 mg 200 mL/hr over 60 Minutes Intravenous Every 12 hours 12/16/20 1451 12/16/20 1734   12/16/20 2200  piperacillin-tazobactam (ZOSYN) IVPB 3.375 g  Status:  Discontinued        3.375 g 12.5 mL/hr over 240 Minutes Intravenous Every 8 hours 12/16/20 1734 12/17/20 0949   12/16/20 1800  metroNIDAZOLE (FLAGYL) IVPB 500 mg  Status:  Discontinued        500 mg 100 mL/hr over 60 Minutes Intravenous Every 8 hours 12/16/20  1451 12/16/20 1734   12/16/20 1515  valACYclovir (VALTREX) tablet 500 mg        500 mg Oral 2 times daily 12/16/20 1505     12/16/20 0915  ciprofloxacin (CIPRO) IVPB 400 mg        400 mg 200 mL/hr over 60 Minutes Intravenous  Once 12/16/20 0912 12/16/20 1104   12/16/20 0915  metroNIDAZOLE (FLAGYL) IVPB 500 mg        500 mg 100 mL/hr over 60 Minutes Intravenous  Once 12/16/20 0912 12/16/20 1104       I have personally reviewed the following labs and images: CBC: Recent Labs  Lab 12/14/20 2242 12/16/20 0952 12/17/20 0425  WBC 9.6 20.8* 20.1*  NEUTROABS 7.6  --  17.2*  HGB 11.3* 12.2 9.5*  HCT 37.3 39.3 31.4*  MCV 103.3* 102.3* 103.3*  PLT 187 181 147*   BMP &GFR Recent Labs  Lab 12/14/20 2242 12/16/20 0952 12/17/20 0425  NA 136 134* 133*  K 4.1 4.2 4.1  CL 98 98 100  CO2 26 19* 21*  GLUCOSE 77 68* 96  BUN 49* 39* 41*  CREATININE 1.16* 1.33* 1.67*  CALCIUM 9.7 9.6 8.4*   Estimated Creatinine Clearance: 32.1 mL/min (A) (by C-G formula based on SCr of 1.67 mg/dL (H)). Liver & Pancreas: Recent Labs  Lab 12/14/20 2242 12/16/20 0952 12/17/20 0425  AST 17 21 17   ALT 15 15 12   ALKPHOS 61 66 58  BILITOT 1.9* 3.1* 2.2*  PROT 7.8 7.8 6.7  ALBUMIN 3.3* 3.3* 2.8*   Recent Labs  Lab 12/16/20 0952  LIPASE 21   No results for input(s): AMMONIA in the last 168 hours. Diabetic: Recent Labs    12/16/20 0902  HGBA1C 5.6   Recent Labs  Lab 12/16/20 1725 12/16/20 2323 12/17/20 0038 12/17/20 0839 12/17/20 1146  GLUCAP 60* 54* 121* 84 88   Cardiac Enzymes: No results for input(s): CKTOTAL, CKMB, CKMBINDEX, TROPONINI in the last 168 hours. No results for input(s): PROBNP in the last 8760 hours. Coagulation Profile: Recent Labs  Lab 12/14/20 2242  INR 1.1   Thyroid Function Tests: No results for input(s): TSH, T4TOTAL, FREET4, T3FREE, THYROIDAB in the last 72 hours. Lipid Profile: No results for input(s): CHOL, HDL, LDLCALC, TRIG, CHOLHDL, LDLDIRECT in the  last 72 hours. Anemia Panel: No results for input(s): VITAMINB12, FOLATE, FERRITIN, TIBC, IRON, RETICCTPCT in the last 72 hours. Urine analysis:    Component Value  Date/Time   COLORURINE YELLOW 12/16/2020 1400   APPEARANCEUR CLEAR 12/16/2020 1400   LABSPEC 1.014 12/16/2020 1400   PHURINE 5.0 12/16/2020 1400   GLUCOSEU NEGATIVE 12/16/2020 1400   HGBUR MODERATE (A) 12/16/2020 1400   BILIRUBINUR NEGATIVE 12/16/2020 1400   KETONESUR 20 (A) 12/16/2020 1400   PROTEINUR 100 (A) 12/16/2020 1400   UROBILINOGEN 0.2 05/15/2013 0733   NITRITE NEGATIVE 12/16/2020 1400   LEUKOCYTESUR NEGATIVE 12/16/2020 1400   Sepsis Labs: Invalid input(s): PROCALCITONIN, [place]  Microbiology: Recent Results (from the past 240 hour(s))  Culture, blood (Routine x 2)     Status: None (Preliminary result)   Collection Time: 12/14/20 10:42 PM   Specimen: Right Antecubital; Blood  Result Value Ref Range Status   Specimen Description   Final    RIGHT ANTECUBITAL Performed at [facility], [place] [address] [place], [place] [address]    Special Requests   Final    BOTTLES DRAWN AEROBIC AND ANAEROBIC Blood Culture adequate volume Performed at [place] [address], [place], [place] [address]    Culture   Final    NO GROWTH 1 DAY Performed at [facility], [place] [address], [place], [place] [address]    Report Status PENDING  Incomplete  Culture, blood (Routine x 2)     Status: None (Preliminary result)   Collection Time: 12/14/20 10:47 PM   Specimen: BLOOD RIGHT FOREARM  Result Value Ref Range Status   Specimen Description   Final    BLOOD RIGHT FOREARM Performed at [place] [address], [place], [place] [address]    Special Requests   Final    BOTTLES DRAWN AEROBIC AND ANAEROBIC Blood Culture results may not be optimal due to an inadequate volume of blood received in culture bottles Performed at [place] [address] [place], [place] [address]    Culture   Final    NO GROWTH 1 DAY Performed at [facility], [place] [address], [place], [place] [address]    Report Status PENDING  Incomplete  SARS CORONAVIRUS 2 (TAT 6-24 HRS) Nasopharyngeal Nasopharyngeal Swab     Status: None   Collection Time: 12/14/20 11:36 PM   Specimen: Nasopharyngeal Swab  Result Value Ref Range Status   SARS Coronavirus 2 NEGATIVE NEGATIVE Final    Comment: (NOTE) SARS-CoV-2 target nucleic acids are NOT DETECTED.  The SARS-CoV-2 RNA is generally detectable in upper and lower respiratory specimens during the acute phase of infection. Negative results do not preclude SARS-CoV-2 infection, do not rule out co-infections with other pathogens, and should not be used as the sole basis for treatment or other patient management decisions. Negative results must be combined with clinical observations, patient history, and epidemiological information. The expected result is Negative.  Fact Sheet for Patients: SugarRoll.be  Fact Sheet for Healthcare Providers: https://www.woods-mathews.com/  This test is not yet approved or cleared by the [place] FDA and  has been authorized for detection and/or diagnosis of SARS-CoV-2 by FDA under an Emergency Use Authorization (EUA). This EUA will remain  in effect (meaning this test can be used) for the duration of the COVID-19 declaration under Se ction 564(b)(1) of the Act, 21 U.S.C. section 360bbb-3(b)(1), unless the authorization is terminated or revoked sooner.  Performed at [facility], [place] [address], [place], [place] [address]     Radiology Studies: CT ABDOMEN PELVIS WO CONTRAST  Result Date: 12/16/2020 CLINICAL DATA:  Diverticulitis suspected. Abdominal pain in the left lower quadrant. EXAM: CT ABDOMEN AND PELVIS WITHOUT CONTRAST TECHNIQUE: Multidetector CT imaging of the abdomen and pelvis was  performed following the standard protocol without IV contrast. COMPARISON:  CT dated December 14, 2020. FINDINGS: Lower chest: There are linear bands of scarring at the lung bases bilaterally. There is a trace right-sided pleural effusion.The heart size is mildly enlarged. Hepatobiliary: Innumerable cysts are noted throughout the patient's liver. Normal gallbladder.There is no biliary ductal dilation. Pancreas: Normal contours without ductal dilatation. No peripancreatic fluid collection. Spleen: Unremarkable. Adrenals/Urinary Tract: --Adrenal glands: Unremarkable. --Right kidney/ureter: The right kidney is enlarged with multiple cysts. --Left kidney/ureter: The left kidney is enlarged with multiple cysts. 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 --Urinary bladder: Unremarkable. Stomach/Bowel: --Stomach/Duodenum: No hiatal hernia or other gastric abnormality. Normal duodenal course and caliber. --Small bowel: Unremarkable. --Colon: Again noted are findings of mild uncomplicated sigmoid diverticulitis (axial series 2, image 74). Innumerable diverticula are noted throughout the remaining portions of the colon. --Appendix: Normal. Vascular/Lymphatic: Atherosclerotic calcification is present within the non-aneurysmal abdominal aorta, without hemodynamically significant stenosis. --No retroperitoneal lymphadenopathy. --No mesenteric lymphadenopathy. --No pelvic or inguinal lymphadenopathy. Reproductive: Unremarkable Other: No ascites or free air. There is a fat containing umbilical hernia. Musculoskeletal. There is no acute fracture. There is an old healed fracture of the sacrum. The patient is status post prior total hip arthroplasty on the left. There is an old healed fracture of the left inferior pubic ramus. There is an old healed fracture of the superior pubic ramus on the left. There are old healed bilateral rib fractures. There are old  bilateral sacral insufficiency fractures. IMPRESSION: 1. Persistent findings of mild uncomplicated sigmoid diverticulitis. 2. Polycystic kidney and liver disease as before. Unremarkable appearance of the right pelvic transplant kidney. 3. Fat containing umbilical hernia. 4. Trace right-sided pleural effusion. 5. Chronic osseous findings as detailed above. Aortic Atherosclerosis (ICD10-I70.0). Electronically Signed   By: Constance Holster M.D.   On: 12/16/2020 21:22   ECHOCARDIOGRAM COMPLETE  Result Date: 12/17/2020    ECHOCARDIOGRAM REPORT   Patient Name:   JALYNN WADDELL Im Date of Exam: 12/17/2020 Medical Rec #:  [id number]         Height:       61.0 in Accession #:    [id number]        Weight:       167.0 lb Date of Birth:  11/30/1955         BSA:          1.749 m Patient Age:    9 years          BP:           105/56 mmHg Patient Gender: F                 HR:           98 bpm. Exam Location:  Inpatient Procedure: 2D Echo, Color Doppler and Cardiac Doppler Indications:    CHF-Acute Diastolic Q03.47  History:        Patient has prior history of Echocardiogram examinations, most                 recent 04/17/2020. CHF, COPD; Risk Factors:Hypertension,                 Dyslipidemia and Diabetes.  Sonographer:    Bernadene Person RDCS Referring Phys: [id number] [place]  1. Left ventricular ejection fraction, by estimation, is 70 to 75%. The left ventricle has hyperdynamic function. The left ventricle has no regional wall motion abnormalities. Left ventricular diastolic parameters are consistent with Grade II diastolic dysfunction (pseudonormalization). Elevated left atrial pressure.  2. Right ventricular systolic function is normal. The right ventricular size is normal. There is normal pulmonary artery systolic pressure.  3. The mitral valve is normal in structure. No evidence of mitral valve regurgitation. No evidence of mitral stenosis.  4. The aortic valve is tricuspid. Aortic valve  regurgitation is not visualized. No aortic stenosis is present.  5. The inferior vena cava is normal in size with greater than 50% respiratory variability, suggesting right atrial pressure of 3 mmHg. FINDINGS  Left Ventricle: Left ventricular ejection fraction, by estimation, is 70 to 75%. The left ventricle has hyperdynamic function. The left ventricle has no regional wall motion abnormalities. The left ventricular internal cavity size was normal in size. There is no left ventricular hypertrophy. Left ventricular diastolic parameters are consistent with Grade II diastolic dysfunction (pseudonormalization). Elevated left atrial pressure. Right Ventricle: The right ventricular size is normal. Right ventricular systolic function is normal. There is normal pulmonary artery systolic pressure. The tricuspid regurgitant velocity is 2.51 m/s, and with an assumed right atrial pressure of 3 mmHg,  the estimated right ventricular systolic pressure is 71.0 mmHg. Left Atrium: Left atrial size was normal in size. Right Atrium: Right atrial size was normal in size. Pericardium: There is no evidence of pericardial effusion. Mitral Valve: The mitral valve is normal in structure. Mild mitral annular calcification. No evidence of mitral valve regurgitation. No evidence of mitral valve stenosis. Tricuspid Valve: The tricuspid valve is normal in structure. Tricuspid valve regurgitation is trivial. No evidence of tricuspid stenosis. Aortic Valve: The aortic valve is tricuspid. Aortic valve regurgitation is not visualized. No aortic stenosis is present. Pulmonic Valve: The pulmonic valve was normal in structure. Pulmonic valve regurgitation is not visualized. No evidence of pulmonic stenosis. Aorta: The aortic root is normal in size and structure. Venous: The inferior vena cava is normal in size with greater than 50% respiratory variability, suggesting right atrial pressure of 3 mmHg. IAS/Shunts: No atrial level shunt detected by color  flow Doppler.  LEFT VENTRICLE PLAX 2D LVIDd:         4.50 cm  Diastology LVIDs:         2.60 cm  LV e' medial:    4.68 cm/s LV PW:         0.90 cm  LV E/e' medial:  20.9 LV IVS:        0.90 cm  LV e' lateral:   9.68 cm/s LVOT diam:     2.00 cm  LV E/e' lateral: 10.1 LV SV:         79 LV SV Index:   45 LVOT Area:     3.14 cm  RIGHT VENTRICLE RV S prime:     13.70 cm/s TAPSE (M-mode): 1.5 cm LEFT ATRIUM           Index       RIGHT ATRIUM           Index LA diam:      4.50 cm 2.57 cm/m  RA Area:     12.10 cm LA Vol (A2C): 46.3 ml 26.47 ml/m RA Volume:   23.40 ml  13.38 ml/m LA Vol (A4C): 51.8 ml 29.61 ml/m  AORTIC VALVE LVOT Vmax:   187.00 cm/s LVOT Vmean:  134.000 cm/s LVOT VTI:    0.253 m  AORTA Ao Root diam: 3.40 cm Ao Asc diam:  3.40 cm MITRAL VALVE               TRICUSPID VALVE MV Area (PHT): 2.60 cm    TR Peak grad:   25.2 mmHg MV Decel Time: 292 msec    TR Vmax:        251.00 cm/s MV E velocity: 97.90 cm/s MV A velocity: 92.40 cm/s  SHUNTS MV E/A ratio:  1.06        Systemic VTI:  0.25 m                            Systemic Diam: 2.00 cm Kirk Ruths MD Electronically signed by Kirk Ruths MD Signature Date/Time: 12/17/2020/11:23:38 AM    Final    VAS [place] LOWER EXTREMITY VENOUS (DVT)  Result Date: 12/17/2020  Lower Venous DVT Study Indications: Swelling L>R.  Risk Factors: Limited mobility due to O2 dependence. Comparison Study: Previous exam - 01/14/20 (negative) Performing Technologist: Rogelia Rohrer  Examination Guidelines: A complete evaluation includes B-mode imaging, spectral Doppler, color Doppler, and power Doppler as needed of all accessible portions of each vessel. Bilateral testing is considered an integral part of a complete examination. Limited examinations for reoccurring indications may be performed as noted. The reflux portion of the exam is performed with the patient in reverse Trendelenburg.  +---------+---------------+---------+-----------+----------+-------------------+ RIGHT     CompressibilityPhasicitySpontaneityPropertiesThrombus Aging      +---------+---------------+---------+-----------+----------+-------------------+ CFV      Full           Yes      Yes                                      +---------+---------------+---------+-----------+----------+-------------------+ SFJ      Full                                                             +---------+---------------+---------+-----------+----------+-------------------+ FV Prox  Full           Yes      Yes                                      +---------+---------------+---------+-----------+----------+-------------------+ FV Mid   Full           Yes      Yes                                      +---------+---------------+---------+-----------+----------+-------------------+ FV DistalFull           Yes      Yes                                      +---------+---------------+---------+-----------+----------+-------------------+ PFV      Full                                                             +---------+---------------+---------+-----------+----------+-------------------+  POP      Partial        Yes      Yes                  Age Indeterminate   +---------+---------------+---------+-----------+----------+-------------------+ PTV      Full                                         Not well visualized +---------+---------------+---------+-----------+----------+-------------------+ PERO     Full                                                             +---------+---------------+---------+-----------+----------+-------------------+   +---------+---------------+---------+-----------+----------+-------------------+ LEFT     CompressibilityPhasicitySpontaneityPropertiesThrombus Aging      +---------+---------------+---------+-----------+----------+-------------------+ CFV      Full           Yes      Yes                                       +---------+---------------+---------+-----------+----------+-------------------+ SFJ      Full                                                             +---------+---------------+---------+-----------+----------+-------------------+ FV Prox  Full           Yes      Yes                                      +---------+---------------+---------+-----------+----------+-------------------+ FV Mid   Full           Yes      Yes                                      +---------+---------------+---------+-----------+----------+-------------------+ FV DistalFull           Yes      Yes                                      +---------+---------------+---------+-----------+----------+-------------------+ PFV      Full                                                             +---------+---------------+---------+-----------+----------+-------------------+ POP      Full           Yes      Yes                                      +---------+---------------+---------+-----------+----------+-------------------+  PTV      Full                                         Not well visualized +---------+---------------+---------+-----------+----------+-------------------+ PERO     Full                                                             +---------+---------------+---------+-----------+----------+-------------------+     Summary: BILATERAL: -No evidence of popliteal cyst, bilaterally. RIGHT: - Findings consistent with age indeterminate, non occlusive deep vein thrombosis involving the right popliteal vein. Subcutaneous edema  LEFT: - There is no evidence of deep vein thrombosis in the lower extremity.  Subcutaneous edema.  *See table(s) above for measurements and observations. Electronically signed by Deitra Mayo MD on 12/17/2020 at 7:47:55 AM.    Final       Konnor Vondrasek T. Morgan  If 7PM-7AM, please contact night-coverage [email or web address] 12/17/2020,  12:51 PM

## 2020-12-17 NOTE — ED Notes (Signed)
Pts BP noted to be 83/55. Gonfa paged at this time. All other vitals stable at this time

## 2020-12-17 NOTE — ED Notes (Signed)
IV team at bedside 

## 2020-12-17 NOTE — Progress Notes (Signed)
Subjective: CC: Patient reports her abdominal pain has resolved except with palpation. She is still having nausea and dry heaves. No emesis. She denies flatus or BM.  She reports left sided chest pain that is pleuritic and has been present since Saturday. She notes sob and a dry cough. She is on 4L. She reports she uses 2L at home PRN.   Objective: Vital signs in last 24 hours: Temp:  [97.8 F (36.6 C)] 97.8 F (36.6 C) (01/25 0613) Pulse Rate:  [81-101] 98 (01/25 0800) Resp:  [16-35] 25 (01/25 0800) BP: (82-148)/(51-84) 111/58 (01/25 0800) SpO2:  [91 %-100 %] 100 % (01/25 0800)    Intake/Output from previous day: No intake/output data recorded. Intake/Output this shift: No intake/output data recorded.  PE: Gen:  Alert, NAD, pleasant HEENT: EOM's intact, pupils equal and round Card:  Tachycardic Pulm:  Distant throughout with some rales at the bases b/l. On 4L Abd: Soft, mild distension, umbilical hernia is reducible but spontaneously recurrs. There is some LLQ > LUQ tenderness without peritonitis. Hypoactive bs.  Ext:  2+ LE edema  Psych: A&Ox3  Skin: no rashes noted, warm and dry   Lab Results:  Recent Labs    12/16/20 0952 12/17/20 0425  WBC 20.8* 20.1*  HGB 12.2 9.5*  HCT 39.3 31.4*  PLT 181 147*   BMET Recent Labs    12/16/20 0952 12/17/20 0425  NA 134* 133*  K 4.2 4.1  CL 98 100  CO2 19* 21*  GLUCOSE 68* 96  BUN 39* 41*  CREATININE 1.33* 1.67*  CALCIUM 9.6 8.4*   PT/INR Recent Labs    12/14/20 2242  LABPROT 14.1  INR 1.1   CMP     Component Value Date/Time   NA 133 (L) 12/17/2020 0425   K 4.1 12/17/2020 0425   CL 100 12/17/2020 0425   CO2 21 (L) 12/17/2020 0425   GLUCOSE 96 12/17/2020 0425   BUN 41 (H) 12/17/2020 0425   CREATININE 1.67 (H) 12/17/2020 0425   CREATININE 1.28 (H) 11/04/2020 0952   CALCIUM 8.4 (L) 12/17/2020 0425   CALCIUM 10.8 (H) 10/13/2011 1405   PROT 6.7 12/17/2020 0425   ALBUMIN 2.8 (L) 12/17/2020 0425    AST 17 12/17/2020 0425   AST 13 (L) 11/04/2020 0952   ALT 12 12/17/2020 0425   ALT 12 11/04/2020 0952   ALKPHOS 58 12/17/2020 0425   BILITOT 2.2 (H) 12/17/2020 0425   BILITOT 0.6 11/04/2020 0952   GFRNONAA 34 (L) 12/17/2020 0425   GFRNONAA 47 (L) 11/04/2020 0952   GFRAA 51 (L) 08/05/2020 1523   Lipase     Component Value Date/Time   LIPASE 21 12/16/2020 0952       Studies/Results: CT ABDOMEN PELVIS WO CONTRAST  Result Date: 12/16/2020 CLINICAL DATA:  Diverticulitis suspected. Abdominal pain in the left lower quadrant. EXAM: CT ABDOMEN AND PELVIS WITHOUT CONTRAST TECHNIQUE: Multidetector CT imaging of the abdomen and pelvis was performed following the standard protocol without IV contrast. COMPARISON:  CT dated December 14, 2020. FINDINGS: Lower chest: There are linear bands of scarring at the lung bases bilaterally. There is a trace right-sided pleural effusion.The heart size is mildly enlarged. Hepatobiliary: Innumerable cysts are noted throughout the patient's liver. Normal gallbladder.There is no biliary ductal dilation. Pancreas: Normal contours without ductal dilatation. No peripancreatic fluid collection. Spleen: Unremarkable. Adrenals/Urinary Tract: --Adrenal glands: Unremarkable. --Right kidney/ureter: The right kidney is enlarged with multiple cysts. --Left kidney/ureter: The left kidney is enlarged  with multiple cysts. 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 --Urinary bladder: Unremarkable. Stomach/Bowel: --Stomach/Duodenum: No hiatal hernia or other gastric abnormality. Normal duodenal course and caliber. --Small bowel: Unremarkable. --Colon: Again noted are findings of mild uncomplicated sigmoid diverticulitis (axial series 2, image 74). Innumerable diverticula are noted throughout the remaining portions of the colon. --Appendix: Normal. Vascular/Lymphatic: Atherosclerotic calcification is present within the  non-aneurysmal abdominal aorta, without hemodynamically significant stenosis. --No retroperitoneal lymphadenopathy. --No mesenteric lymphadenopathy. --No pelvic or inguinal lymphadenopathy. Reproductive: Unremarkable Other: No ascites or free air. There is a fat containing umbilical hernia. Musculoskeletal. There is no acute fracture. There is an old healed fracture of the sacrum. The patient is status post prior total hip arthroplasty on the left. There is an old healed fracture of the left inferior pubic ramus. There is an old healed fracture of the superior pubic ramus on the left. There are old healed bilateral rib fractures. There are old bilateral sacral insufficiency fractures. IMPRESSION: 1. Persistent findings of mild uncomplicated sigmoid diverticulitis. 2. Polycystic kidney and liver disease as before. Unremarkable appearance of the right pelvic transplant kidney. 3. Fat containing umbilical hernia. 4. Trace right-sided pleural effusion. 5. Chronic osseous findings as detailed above. Aortic Atherosclerosis (ICD10-I70.0). Electronically Signed   By: Constance Holster M.D.   On: 12/16/2020 21:22   DG Chest Port 1 View  Result Date: 12/16/2020 CLINICAL DATA:  Weakness, pleural effusions, LEFT lower quadrant abdominal pain, nausea, dry heaving, pleural effusions by prior radiograph EXAM: PORTABLE CHEST 1 VIEW COMPARISON:  Portable exam 1205 hours compared to 12/14/2020 FINDINGS: Borderline enlargement of cardiac silhouette with slight vascular congestion. Mediastinal contours normal. Bibasilar atelectasis and tiny pleural effusions. No acute infiltrate/edema or pneumothorax. Bones demineralized. IMPRESSION: Enlargement of cardiac silhouette with pulmonary vascular congestion. Bibasilar atelectasis and tiny pleural effusions. Electronically Signed   By: Lavonia Dana M.D.   On: 12/16/2020 12:27   VAS [place] LOWER EXTREMITY VENOUS (DVT)  Result Date: 12/17/2020  Lower Venous DVT Study Indications: Swelling  L>R.  Risk Factors: Limited mobility due to O2 dependence. Comparison Study: Previous exam - 01/14/20 (negative) Performing Technologist: Rogelia Rohrer  Examination Guidelines: A complete evaluation includes B-mode imaging, spectral Doppler, color Doppler, and power Doppler as needed of all accessible portions of each vessel. Bilateral testing is considered an integral part of a complete examination. Limited examinations for reoccurring indications may be performed as noted. The reflux portion of the exam is performed with the patient in reverse Trendelenburg.  +---------+---------------+---------+-----------+----------+-------------------+ RIGHT    CompressibilityPhasicitySpontaneityPropertiesThrombus Aging      +---------+---------------+---------+-----------+----------+-------------------+ CFV      Full           Yes      Yes                                      +---------+---------------+---------+-----------+----------+-------------------+ SFJ      Full                                                             +---------+---------------+---------+-----------+----------+-------------------+ FV Prox  Full           Yes      Yes                                      +---------+---------------+---------+-----------+----------+-------------------+  FV Mid   Full           Yes      Yes                                      +---------+---------------+---------+-----------+----------+-------------------+ FV DistalFull           Yes      Yes                                      +---------+---------------+---------+-----------+----------+-------------------+ PFV      Full                                                             +---------+---------------+---------+-----------+----------+-------------------+ POP      Partial        Yes      Yes                  Age Indeterminate   +---------+---------------+---------+-----------+----------+-------------------+ PTV       Full                                         Not well visualized +---------+---------------+---------+-----------+----------+-------------------+ PERO     Full                                                             +---------+---------------+---------+-----------+----------+-------------------+   +---------+---------------+---------+-----------+----------+-------------------+ LEFT     CompressibilityPhasicitySpontaneityPropertiesThrombus Aging      +---------+---------------+---------+-----------+----------+-------------------+ CFV      Full           Yes      Yes                                      +---------+---------------+---------+-----------+----------+-------------------+ SFJ      Full                                                             +---------+---------------+---------+-----------+----------+-------------------+ FV Prox  Full           Yes      Yes                                      +---------+---------------+---------+-----------+----------+-------------------+ FV Mid   Full           Yes      Yes                                      +---------+---------------+---------+-----------+----------+-------------------+  FV DistalFull           Yes      Yes                                      +---------+---------------+---------+-----------+----------+-------------------+ PFV      Full                                                             +---------+---------------+---------+-----------+----------+-------------------+ POP      Full           Yes      Yes                                      +---------+---------------+---------+-----------+----------+-------------------+ PTV      Full                                         Not well visualized +---------+---------------+---------+-----------+----------+-------------------+ PERO     Full                                                              +---------+---------------+---------+-----------+----------+-------------------+     Summary: BILATERAL: -No evidence of popliteal cyst, bilaterally. RIGHT: - Findings consistent with age indeterminate, non occlusive deep vein thrombosis involving the right popliteal vein. Subcutaneous edema  LEFT: - There is no evidence of deep vein thrombosis in the lower extremity.  Subcutaneous edema.  *See table(s) above for measurements and observations. Electronically signed by Deitra Mayo MD on 12/17/2020 at 7:47:55 AM.    Final     Anti-infectives: Anti-infectives (From admission, onward)   Start     Dose/Rate Route Frequency Ordered Stop   12/16/20 2200  ciprofloxacin (CIPRO) IVPB 400 mg  Status:  Discontinued        400 mg 200 mL/hr over 60 Minutes Intravenous Every 12 hours 12/16/20 1451 12/16/20 1734   12/16/20 2200  piperacillin-tazobactam (ZOSYN) IVPB 3.375 g        3.375 g 12.5 mL/hr over 240 Minutes Intravenous Every 8 hours 12/16/20 1734     12/16/20 1800  metroNIDAZOLE (FLAGYL) IVPB 500 mg  Status:  Discontinued        500 mg 100 mL/hr over 60 Minutes Intravenous Every 8 hours 12/16/20 1451 12/16/20 1734   12/16/20 1515  valACYclovir (VALTREX) tablet 500 mg        500 mg Oral 2 times daily 12/16/20 1505     12/16/20 0915  ciprofloxacin (CIPRO) IVPB 400 mg        400 mg 200 mL/hr over 60 Minutes Intravenous  Once 12/16/20 0912 12/16/20 1104   12/16/20 0915  metroNIDAZOLE (FLAGYL) IVPB 500 mg        500 mg 100 mL/hr over 60 Minutes Intravenous  Once 12/16/20 0912 12/16/20 1104       Assessment/Plan Polycystic  disease with renal transplant 2013 -Prograf/prednisone. Renal consult? Elevated Cr - Cr 1.67 from 8.41 CHF/uncertain etiology -BNP 2872, CXR with pulm vascular congestion. Echo ordred by [facility] Chronic COPD/history of tobacco use - On 2L PRN at home. Currently on 4L Pleuritic chest pain and SOB - further workup per [facility] Age indeterminate, non occlusive deep vein thrombosis  involving the right popliteal vein Hx SVT Hypoglycemia  COVID status - Ag negative. PCR has not been performed  - Per [facility] -    Umbilical hernia - CT with fat containing umbilical hernia. This is reducible at bedside but spontaneously reoccurs   Diverticulitis - Dx 1/22 and failed outpatient tx. CT 6/60 w/ mild uncomplicated sigmoid diverticulitis. No abscess.  - Cont IV and bowel rest.  - No indication for emergency surgery - Hopefully will be able to avoid surgery during admission that would likely result in a colostomy  - We will follow with you  FEN:NPO ID:  [id number] DVT: Subcu heparin    LOS: 1 day    Jillyn Ledger , [facility] 12/17/2020, 9:39 AM Please see Amion for pager number during day hours 7:00am-4:30pm

## 2020-12-17 NOTE — Progress Notes (Signed)
Pharmacy Antibiotic Note  April [name] is a 65 y.o. female admitted on 12/16/2020 with diverticulitis. Antibiotics being switched to Unasyn. Pharmacy consulted for dosing.   Plan: Unasyn 3g IV q6h Monitor renal function, cultures, clinical course     Temp (24hrs), Avg:97.8 F (36.6 C), Min:97.8 F (36.6 C), Max:97.8 F (36.6 C)  Recent Labs  Lab 12/14/20 2242 12/16/20 0952 12/16/20 1515 12/16/20 1758 12/17/20 0425  WBC 9.6 20.8*  --   --  20.1*  CREATININE 1.16* 1.33*  --   --  1.67*  LATICACIDVEN 0.9  --  0.8 0.7  --     Estimated Creatinine Clearance: 32.1 mL/min (A) (by C-G formula based on SCr of 1.67 mg/dL (H)).    Allergies  Allergen Reactions   Infed [Iron Dextran] Other (See Comments)    Chest tightness   Pentamidine Itching, Shortness Of Breath and Swelling   Budesonide-Formoterol Fumarate     Other reaction(s): hoarseness   Bupropion     Other reaction(s): exacerbated depression   Crestor [Rosuvastatin]     Other reaction(s): body aches, swelling   Duloxetine Hcl     Other reaction(s): GI SE, weepy, worsening fatigue, foggy   Erythromycin [Erythromycin] Other (See Comments)    Mouth Ulcers   Iohexol Other (See Comments)    Per patient "she has had a kidney transplant and should never have contrast"   Oxycodone Nausea Only and Nausea And Vomiting   Pravastatin Sodium     Other reaction(s): myalgias   Erythromycin Rash    Causes breakout in mouth   Ultram [Tramadol Hcl] Anxiety    Thank you for allowing pharmacy to be a part of this patients care.  Lindell Spar, PharmD, BCPS 12/17/2020 10:01 AM

## 2020-12-17 NOTE — ED Notes (Signed)
Patient reported SOB and tightness in chest. Patient states she usually takes a breathing treatment when she feels that way at home. Patient given prn duoneb.

## 2020-12-17 NOTE — ED Notes (Signed)
Pharmacy made aware eye drops are unavailable

## 2020-12-17 NOTE — Progress Notes (Signed)
  Echocardiogram 2D Echocardiogram has been performed.  April [name] 12/17/2020, 9:02 AM

## 2020-12-17 NOTE — ED Notes (Signed)
See orders regarding pts BP

## 2020-12-17 NOTE — Progress Notes (Signed)
Notified by patient's RN about patient's BP of 86/52. When I went to see patient, she is sleeping soundly, and wakes to voice easily. BP 92/52.  Saturating at 99% on 3 L.  Heart rate in 80s.  No tachypnea or significant respiratory distress.  VQ scan was reported as low probability for PE.  She has no lactic acidosis.  Vitals:   12/17/20 1630 12/17/20 1651 12/17/20 1700 12/17/20 1715  BP: (!) 81/44 (!) 101/52 (!) 86/52 (!) 92/52  Pulse: 82 84 82 82  Resp: 14 (!) 26 16 18   Temp:      TempSrc:      SpO2: 97% 99% 99% 97%   Assessment and plan -Discussed with cardiology on-call, Dr. Harrell Gave who suggested looking for other causes of hypotension as her Echo seems to be hyperdynamic. Cardiology to see patient in the morning -Check blood culture. -Waiting on formal consult by nephrology-consulted in the morning. -Continue monitoring.

## 2020-12-17 NOTE — Progress Notes (Signed)
ANTICOAGULATION CONSULT NOTE - Initial Consult  Pharmacy Consult for IV heparin  Indication: empiric treatment of PE, pending VQ scan   Allergies  Allergen Reactions  . Infed [Iron Dextran] Other (See Comments)    Chest tightness  . Pentamidine Itching, Shortness Of Breath and Swelling  . Budesonide-Formoterol Fumarate     Other reaction(s): hoarseness  . Bupropion     Other reaction(s): exacerbated depression  . Crestor [Rosuvastatin]     Other reaction(s): body aches, swelling  . Duloxetine Hcl     Other reaction(s): GI SE, weepy, worsening fatigue, foggy  . Erythromycin [Erythromycin] Other (See Comments)    Mouth Ulcers  . Iohexol Other (See Comments)    Per patient "she has had a kidney transplant and should never have contrast"  . Oxycodone Nausea Only and Nausea And Vomiting  . Pravastatin Sodium     Other reaction(s): myalgias  . Erythromycin Rash    Causes breakout in mouth  . Ultram [Tramadol Hcl] Anxiety    Patient Measurements:    Height: 5\' 1"  Weight: 75.8 kg Heparin Dosing Weight: 64.6 kg  Vital Signs: Temp: 97.8 F (36.6 C) (01/25 0613) Temp Source: Oral (01/25 7494) BP: 117/68 (01/25 1015) Pulse Rate: 103 (01/25 1015)  Labs: Recent Labs    12/14/20 2242 12/16/20 0952 12/17/20 0425  HGB 11.3* 12.2 9.5*  HCT 37.3 39.3 31.4*  PLT 187 181 147*  LABPROT 14.1  --   --   INR 1.1  --   --   CREATININE 1.16* 1.33* 1.67*    Estimated Creatinine Clearance: 32.1 mL/min (A) (by C-G formula based on SCr of 1.67 mg/dL (H)). 40.  Medical History: Past Medical History:  Diagnosis Date  . Arthritis   . Asthma   . CHF (congestive heart failure) ([place])   . COPD (chronic obstructive pulmonary disease) ([place])   . Depression   . Essential hypertension 02/08/2020  . GERD (gastroesophageal reflux disease)   . Hyperlipidemia   . Hyperparathyroidism   . Polycystic kidney   . PONV (postoperative nausea and vomiting)     Medications:  No anticoagulants  PTA  Assessment: 53 y/oF with PMH of HTN, GERD, COPD, CHF, ESRD s/p renal transplant admitted for acute diverticulitis. Lower extremity venous duplex + for age indeterminate, non occlusive DVT involving the right popliteal vein. VQ scan pending. Pharmacy consulted to start IV heparin drip. Last dose of SQ heparin 5000 units given at 0617 this AM. CBC: Hgb decreased to 9.5, Pltc decreased to 147K. SCr increased to 1.67, CrCl ~ 32 ml/min.    Goal of Therapy:  Heparin level 0.3-0.7 units/ml Monitor platelets by anticoagulation protocol: Yes   Plan:   Discontinue SQ heparin  Heparin 1800 units IV bolus x 1, then start heparin infusion at 1050 units/hr  Heparin level 8 hours after initiation  Daily CBC, heparin level  F/u VQ scan   Monitor closely for s/sx of bleeding   Lindell Spar, PharmD, BCPS Clinical Pharmacist  12/17/2020,11:52 AM

## 2020-12-17 NOTE — ED Notes (Signed)
Attempted to call report. Inpatient RN to call back when able to take report

## 2020-12-17 NOTE — Progress Notes (Signed)
Pharmacy: [facility]  Patient's a 65 y.o F with hx renal transplant presented to the ED on 1/24 with c/o abdominal pain. LE doppler on 1/24 showed age indeterminate, non occlusive DVT involving the right popliteal vein. She's currently on heparin drip for VTE.  - First heparin level is undetectable - per pt's RN, no issues with IV line and no bleeding noted   Goal of Therapy:  Heparin level 0.3-0.7 units/ml Monitor platelets by anticoagulation protocol: Yes  Plan: - heparin 2000 units IV bolus, then increase drip to 1200 drip at units/hr - check 8 hr heparin level - monitor for s/sx bleeding  Dia Sitter, PharmD, BCPS 12/17/2020 9:43 PM

## 2020-12-18 ENCOUNTER — Inpatient Hospital Stay (HOSPITAL_COMMUNITY): Payer: Medicare Other

## 2020-12-18 ENCOUNTER — Encounter (HOSPITAL_COMMUNITY): Payer: Self-pay | Admitting: Internal Medicine

## 2020-12-18 DIAGNOSIS — I471 Supraventricular tachycardia: Secondary | ICD-10-CM

## 2020-12-18 DIAGNOSIS — R1114 Bilious vomiting: Secondary | ICD-10-CM

## 2020-12-18 DIAGNOSIS — I7300 Raynaud's syndrome without gangrene: Secondary | ICD-10-CM | POA: Diagnosis not present

## 2020-12-18 DIAGNOSIS — K5792 Diverticulitis of intestine, part unspecified, without perforation or abscess without bleeding: Secondary | ICD-10-CM | POA: Diagnosis not present

## 2020-12-18 DIAGNOSIS — R0602 Shortness of breath: Secondary | ICD-10-CM

## 2020-12-18 DIAGNOSIS — N179 Acute kidney failure, unspecified: Secondary | ICD-10-CM | POA: Diagnosis not present

## 2020-12-18 DIAGNOSIS — E2749 Other adrenocortical insufficiency: Secondary | ICD-10-CM | POA: Diagnosis not present

## 2020-12-18 DIAGNOSIS — I5031 Acute diastolic (congestive) heart failure: Secondary | ICD-10-CM | POA: Diagnosis not present

## 2020-12-18 LAB — GLUCOSE, CAPILLARY
Glucose-Capillary: 72 mg/dL (ref 70–99)
Glucose-Capillary: 140 mg/dL — ABNORMAL HIGH (ref 70–99)
Glucose-Capillary: 110 mg/dL — ABNORMAL HIGH (ref 70–99)

## 2020-12-18 LAB — BRAIN NATRIURETIC PEPTIDE: B Natriuretic Peptide: 567 pg/mL — ABNORMAL HIGH (ref 0.0–100.0)

## 2020-12-18 LAB — COMPREHENSIVE METABOLIC PANEL
ALT: 11 U/L (ref 0–44)
AST: 15 U/L (ref 15–41)
Albumin: 2.8 g/dL — ABNORMAL LOW (ref 3.5–5.0)
Alkaline Phosphatase: 50 U/L (ref 38–126)
Anion gap: 19 — ABNORMAL HIGH (ref 5–15)
BUN: 51 mg/dL — ABNORMAL HIGH (ref 8–23)
CO2: 15 mmol/L — ABNORMAL LOW (ref 22–32)
Calcium: 8.4 mg/dL — ABNORMAL LOW (ref 8.9–10.3)
Chloride: 96 mmol/L — ABNORMAL LOW (ref 98–111)
Creatinine, Ser: 2.52 mg/dL — ABNORMAL HIGH (ref 0.44–1.00)
GFR, Estimated: 21 mL/min — ABNORMAL LOW (ref >60)
Glucose, Bld: 86 mg/dL (ref 70–99)
Potassium: 4.7 mmol/L (ref 3.5–5.1)
Sodium: 130 mmol/L — ABNORMAL LOW (ref 135–145)
Total Bilirubin: 1.3 mg/dL — ABNORMAL HIGH (ref 0.3–1.2)
Total Protein: 6.7 g/dL (ref 6.5–8.1)

## 2020-12-18 LAB — TROPONIN I (HIGH SENSITIVITY)
Troponin I (High Sensitivity): 11 ng/L (ref <18)
Troponin I (High Sensitivity): 14 ng/L (ref <18)

## 2020-12-18 LAB — MAGNESIUM: Magnesium: 1.9 mg/dL (ref 1.7–2.4)

## 2020-12-18 LAB — HEPARIN LEVEL (UNFRACTIONATED): Heparin Unfractionated: 0.25 IU/mL — ABNORMAL LOW (ref 0.30–0.70)

## 2020-12-18 LAB — PHOSPHORUS: Phosphorus: 6.1 mg/dL — ABNORMAL HIGH (ref 2.5–4.6)

## 2020-12-18 MED ORDER — SODIUM CHLORIDE 0.9 % IV BOLUS
500.0000 mL | Freq: Once | INTRAVENOUS | Status: AC
  Administered 2020-12-18: 500 mL via INTRAVENOUS

## 2020-12-18 MED ORDER — SODIUM CHLORIDE 0.9 % IV SOLN
3.0000 g | Freq: Two times a day (BID) | INTRAVENOUS | Status: DC
  Administered 2020-12-18 – 2020-12-23 (×11): 3 g via INTRAVENOUS
  Filled 2020-12-18 (×4): qty 3
  Filled 2020-12-18 (×3): qty 8
  Filled 2020-12-18 (×2): qty 3
  Filled 2020-12-18: qty 8
  Filled 2020-12-18: qty 3
  Filled 2020-12-18: qty 8
  Filled 2020-12-18: qty 3

## 2020-12-18 MED ORDER — HEPARIN BOLUS VIA INFUSION
1000.0000 [IU] | Freq: Once | INTRAVENOUS | Status: AC
  Administered 2020-12-18: 1000 [IU] via INTRAVENOUS
  Filled 2020-12-18: qty 1000

## 2020-12-18 MED ORDER — HEPARIN SODIUM (PORCINE) 5000 UNIT/ML IJ SOLN
5000.0000 [IU] | Freq: Three times a day (TID) | INTRAMUSCULAR | Status: DC
  Administered 2020-12-18 – 2020-12-23 (×14): 5000 [IU] via SUBCUTANEOUS
  Filled 2020-12-18 (×14): qty 1

## 2020-12-18 NOTE — Consult Note (Addendum)
Renal Service Consult Note [facility]  [facility] 12/18/2020 April Blazing, MD Requesting Physician: Dr Rodena Piety, E.   Reason for Consult: Renal transplant patient w/ diverticulitis HPI: The patient is a 65 y.o. year-old w/ hx of PCKD and ESRD, was on HD until renal transplant in 2013. Also has HL, HTN, depression, COPD and anemia. Pt came to ED on 1/22 for fever , flank pain and hx kidney tx. Some N/V, fever 101 at home. She was sent home w/ po abx for early diverticulitis.  Now comes to ED again 1/24 w/ c/o abdominal pain LLQ, nausea , dry heaves. Admitted. Creat 1.3 on admit, up to 2.52 today.  Asked to see for renal failure.    Pt seen in room.  BP's soft overnight in the 90's.  Pt getting her tx IS meds including prograf 1 mg bid, prednisone and myfortic. Na 130  K 4.7  CO2 15  BUN 52  Cr 2.52.  AG 19.  Phos 6.1.  Alb 2.8.  LFT's okay.  BNP 567. WBC 20K Hb 9.5  Just got bronchodilator nebs, SOB is better.      ROS  denies CP  no joint pain   no HA  no blurry vision  no rash     Past Medical History  Past Medical History:  Diagnosis Date  . Arthritis   . Asthma   . CHF (congestive heart failure) ([place])   . COPD (chronic obstructive pulmonary disease) ([place])   . Depression   . Essential hypertension 02/08/2020  . GERD (gastroesophageal reflux disease)   . Hyperlipidemia   . Hyperparathyroidism   . Polycystic kidney   . PONV (postoperative nausea and vomiting)    Past Surgical History  Past Surgical History:  Procedure Laterality Date  . 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 BILATERAL OOPHORECTOMY    . BIOPSY  05/19/2020   Procedure: BIOPSY;  Surgeon: Carol Ada, MD;  [facility];  Service: Endoscopy;;  . CARPAL TUNNEL RELEASE  2000  . CESAREAN SECTION    . COLONOSCOPY WITH PROPOFOL N/A 05/19/2020   Procedure: COLONOSCOPY WITH PROPOFOL;  Surgeon: Carol Ada, MD;  Location: [place];  Service:  Endoscopy;  Laterality: N/A;  . ENTEROSCOPY N/A 05/19/2020   Procedure: ENTEROSCOPY;  Surgeon: Carol Ada, MD;  Location: [place];  Service: Endoscopy;  Laterality: N/A;  . INSERTION OF DIALYSIS CATHETER  03/31/2012   Procedure: INSERTION OF DIALYSIS CATHETER;  Surgeon: Angelia Mould, MD;  Location: [place];  Service: Vascular;  Laterality: N/A;  insertion of dialysis catheter right internal jugular vein  . 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 ORIF TIBIA FRACTURE Left 12/23/2018   Procedure: OPEN REDUCTION INTERNAL FIXATION (ORIF) TIBIA FRACTURE;  Surgeon: Shona Needles, MD;  Location: [place];  Service: Orthopedics;  Laterality: Left;  . ORIF TIBIA PLATEAU Right 12/23/2018   Procedure: OPEN REDUCTION INTERNAL FIXATION (ORIF) TIBIAL PLATEAU;  Surgeon: Shona Needles, MD;  Location: [place];  Service: Orthopedics;  Laterality: Right;  . ORIF WRIST FRACTURE Left 12/08/2019   Procedure: OPEN REDUCTION INTERNAL FIXATION (ORIF) WRIST FRACTURE, REPAIR LACERATION LEFT FOREARM;  Surgeon: Dorna Leitz, MD;  Location: [facility];  Service: Orthopedics;  Laterality: Left;  . TONSILLECTOMY  1967  . TOTAL HIP ARTHROPLASTY Left 12/08/2019   Procedure: TOTAL HIP ARTHROPLASTY ANTERIOR APPROACH;  Surgeon: Dorna Leitz, MD;  Location: [facility];  Service: Orthopedics;  Laterality: Left;  .  TUBAL LIGATION  2010   Family History  Family History  Problem Relation Age of Onset  . Hypertension Mother   . Heart disease Father        CABG history  . Polycystic kidney disease Father   . Polycystic kidney disease Brother    Social History  reports that she quit smoking about 22 years ago. Her smoking use included cigarettes. She has a 3.75 pack-year smoking history. She has never used smokeless tobacco. She reports that she does not drink alcohol and does not use drugs. Allergies  Allergies  Allergen Reactions  . Infed [Iron Dextran] Other (See Comments)    Chest tightness  . Pentamidine Itching, Shortness Of  Breath and Swelling  . Budesonide-Formoterol Fumarate     Other reaction(s): hoarseness  . Bupropion     Other reaction(s): exacerbated depression  . Crestor [Rosuvastatin]     Other reaction(s): body aches, swelling  . Duloxetine Hcl     Other reaction(s): GI SE, weepy, worsening fatigue, foggy  . Erythromycin [Erythromycin] Other (See Comments)    Mouth Ulcers  . Iohexol Other (See Comments)    Per patient "she has had a kidney transplant and should never have contrast"  . Oxycodone Nausea Only and Nausea And Vomiting  . Pravastatin Sodium     Other reaction(s): myalgias  . Erythromycin Rash    Causes breakout in mouth  . Ultram [Tramadol Hcl] Anxiety   Home medications Prior to Admission medications   Medication Sig Start Date End Date Taking? Authorizing Provider  acetaminophen (TYLENOL) 325 MG tablet Take 2 tablets (650 mg total) by mouth every 6 (six) hours as needed for mild pain (or Fever >/= 101). 05/22/20  Yes Elgergawy, Silver Huguenin, MD  albuterol (VENTOLIN HFA) 108 (90 Base) MCG/ACT inhaler Inhale 2 puffs into the lungs every 4 (four) hours as needed for wheezing or shortness of breath. 01/04/20  Yes Angiulli, Lavon Paganini, PA-C  allopurinol (ZYLOPRIM) 100 MG tablet Take 100 mg by mouth daily. 11/29/20  Yes [provider]  amoxicillin (AMOXIL) 500 MG capsule Take 1,000 mg by mouth See admin instructions. 1073m twice daily as needed for dentist 12/09/20  Yes [provider]  brimonidine (ALPHAGAN P) 0.1 % SOLN Place 1 drop into both eyes 2 (two) times daily.    Yes [provider]  Budeson-Glycopyrrol-Formoterol (BREZTRI AEROSPHERE) 160-9-4.8 MCG/ACT AERO Inhale 2 puffs into the lungs 2 (two) times daily. 08/20/20  Yes WMartyn Ehrich NP  cholecalciferol (VITAMIN D3) 25 MCG (1000 UNIT) tablet Take 1 tablet (1,000 Units total) by mouth daily. 01/04/20  Yes Angiulli, DLavon Paganini PA-C  cyanocobalamin 1000 MCG tablet Take 1,000 mcg by mouth daily.   Yes [provider]  cycloSPORINE (RESTASIS) 0.05 % ophthalmic emulsion Place 1 drop into both eyes 2 (two) times daily. 01/04/20  Yes Angiulli, DLavon Paganini PA-C  dorzolamide (TRUSOPT) 2 % ophthalmic solution Place 1 drop into both eyes daily. 06/25/19  Yes [provider]  ferrous sulfate 325 (65 FE) MG tablet Take 325 mg by mouth daily with breakfast.   Yes [provider]  FLUoxetine (PROZAC) 40 MG capsule Take 1 capsule (40 mg total) by mouth daily. 01/04/20  Yes Angiulli, DLavon Paganini PA-C  furosemide (LASIX) 80 MG tablet Take 80 mg by mouth daily. 10/30/20  Yes [provider]  gabapentin (NEURONTIN) 100 MG capsule Take 200 mg by mouth 2 (two) times daily.   Yes [provider]  guaiFENesin (MChapman 600  MG 12 hr tablet Take 600 mg by mouth 2 (two) times daily.   Yes [provider]  ipratropium-albuterol (DUONEB) 0.5-2.5 (3) MG/3ML SOLN Take 3 mLs by nebulization every 6 (six) hours as needed (sob/wheezing).  08/24/20  Yes [provider]  isosorbide mononitrate (IMDUR) 60 MG 24 hr tablet Take 1 tablet (60 mg total) by mouth daily. 05/23/20  Yes Elgergawy, Silver Huguenin, MD  magnesium oxide (MAG-OX) 400 MG tablet Take 1 tablet (400 mg total) by mouth 2 (two) times daily. 01/04/20  Yes Angiulli, Lavon Paganini, PA-C  mycophenolate (MYFORTIC) 180 MG EC tablet Take 180 mg by mouth 2 (two) times daily.   Yes [provider]  pantoprazole (PROTONIX) 40 MG tablet Take 1 tablet (40 mg total) by mouth 2 (two) times daily. 01/04/20  Yes Angiulli, Lavon Paganini, PA-C  predniSONE (DELTASONE) 10 MG tablet 70mX3 days, 348mX3 days, 2059m3 days, 67m49mdays, then stop. Patient taking differently: Take 10-40 mg by mouth See admin instructions. 40mg57mays, 30mg 47mays, 20mg X26mys, 67mgX3 30m, then stop. 12/06/20  Yes Dewald, Freddi StarrOGRAF 1 MG capsule Take 1 mg by mouth 2 (two) times daily.  03/22/19  Yes [provider]  promethazine (PHENERGAN) 25 MG tablet  Take 1 tablet (25 mg total) by mouth every 8 (eight) hours as needed for up to 15 doses for nausea or vomiting. 09/02/20  Yes Curatolo, Adam, DO  TRAVATAN Z 0.004 % SOLN ophthalmic solution Place 1 drop into both eyes at bedtime. 07/03/16  Yes [provider]  valACYclovir (VALTREX) 500 MG tablet Take 500 mg by mouth 2 (two) times daily.   Yes [provider]  verapamil (CALAN-SR) 240 MG CR tablet Take 240 mg by mouth daily.   Yes [provider]  ciprofloxacin (CIPRO) 500 MG tablet Take 1 tablet (500 mg total) by mouth 2 (two) times daily for 7 days. Patient not taking: Reported on 12/16/2020 12/15/20 12/22/20  Bero, MiMaudie FlakestroNIDAZOLE (FLAGYL) 500 MG tablet Take 1 tablet (500 mg total) by mouth 3 (three) times daily for 7 days. Patient not taking: Reported on 12/16/2020 12/15/20 12/22/20  Bero, MiMaudie Flakesdansetron (ZOFRAN ODT) 4 MG disintegrating tablet Take 1 tablet (4 mg total) by mouth every 8 (eight) hours as needed for nausea or vomiting. Patient not taking: Reported on 12/16/2020 12/15/20   Bero, MiMaudie Flakes Vitals:   12/17/20 2248 12/18/20 0032 12/18/20 0235 12/18/20 0527  BP: (!) 99/50  (!) 92/51 (!) 94/41  Pulse: 75  69 73  Resp: 18  18 20   Temp: (!) 97.4 F (36.3 C)  98.1 F (36.7 C) (!) 97.5 F (36.4 C)  TempSrc: Oral   Oral  SpO2: 100%  100% 100%  Weight:  75 kg    Height:  5' 1"  (1.549 m)     Exam Gen chron ill appearing, nasal O2 No rash, cyanosis or gangrene Sclera anicteric, throat clear  No jvd or bruits Chest clear bilat on L, mild crackles R base RRR no MRG Abd soft ntnd no mass or ascites +bs GU defer MS no joint effusions or deformity Ext 1+ RLE pretib edema, no other pitting edema Neuro is alert, Ox 3 , nf     Home meds:  - zyloprim 100/ protonix 40 bid  - pred 5 mg (?)/ prograf 1 bid/ myfortic 180 bid  - verapamil 240 cr qd/ imdur 60 qd/ lasix  80 qd  - prozac 40/ neurontin 200 bid  - valtrex 500 bid/ cipro  po / flagyl po  - duoneb qid prn/ mucinex bid/ Breztri aerosphere 2 bid   Assessment/ Plan: 1. AKI on CKD 3a renal transplant - b/l creat 1.17- 1.51 (39- 52 egfr ml/min). F/b Dr Royce Macadamia, on usual regimen prograf, pred and myfortic. Here for acute diverticulitis. Getting IV abx. BP's dropped into 90's yest and creat up today 2.5.  AKI likely due to hypotension/ hypoperfusion. Does not look vol overloaded by CXR or on exam. Pt SOB this am , responded to nebulizers. Will give small bolus 500 cc x 1. F/u labs in am. Will follow.  2. COPD - on mult inhaled medications, per pmd 3. Gout - allopurinol 4. BP/volume - as above, is at same weights as last admit , mid 70 kg's, min edema L pretib on exam  Kelly Splinter, MD 12/18/2020, 2:43 PM

## 2020-12-18 NOTE — Progress Notes (Signed)
Pharmacy Antibiotic Note  April [name] is a 65 y.o. female admitted on 12/16/2020 with diverticulitis. Pharmacy consulted to dose ampicillin/sulbactam.   Today, 12/18/20  WBC 20.1 remains elevated  SCr 2.52, CrCl ~21 mL/min. Worsening renal function  Afebrile  Plan:  Decrease dose of ampicillin/sulbactam to 3 g IV q12h for CrCl 15-29 mL/min  Follow renal function for necessary dose adjustments  Height: 5\' 1"  (154.9 cm) Weight: 75 kg (165 lb 5.5 oz) IBW/kg (Calculated) : 47.8  Temp (24hrs), Avg:97.7 F (36.5 C), Min:97.4 F (36.3 C), Max:98.1 F (36.7 C)  Recent Labs  Lab 12/14/20 2242 12/16/20 0952 12/16/20 1515 12/16/20 1758 12/17/20 0425 12/18/20 0604  WBC 9.6 20.8*  --   --  20.1*  --   CREATININE 1.16* 1.33*  --   --  1.67* 2.52*  LATICACIDVEN 0.9  --  0.8 0.7  --   --     Estimated Creatinine Clearance: 21.2 mL/min (A) (by C-G formula based on SCr of 2.52 mg/dL (H)).    Allergies  Allergen Reactions  . Infed [Iron Dextran] Other (See Comments)    Chest tightness  . Pentamidine Itching, Shortness Of Breath and Swelling  . Budesonide-Formoterol Fumarate     Other reaction(s): hoarseness  . Bupropion     Other reaction(s): exacerbated depression  . Crestor [Rosuvastatin]     Other reaction(s): body aches, swelling  . Duloxetine Hcl     Other reaction(s): GI SE, weepy, worsening fatigue, foggy  . Erythromycin [Erythromycin] Other (See Comments)    Mouth Ulcers  . Iohexol Other (See Comments)    Per patient "she has had a kidney transplant and should never have contrast"  . Oxycodone Nausea Only and Nausea And Vomiting  . Pravastatin Sodium     Other reaction(s): myalgias  . Erythromycin Rash    Causes breakout in mouth  . Ultram [Tramadol Hcl] Anxiety    Thank you for allowing pharmacy to be a part of this patient's care.  Antimicrobials this admission:  Piperacillin/tazobactam 1/24 >> 1/25 Ampicillin/sulbactam 1/25 >> 1/26 1 dose of  ciprofloxacin + metronidazole given in ED 1/24  Dose adjustments this admission:   Microbiology results:  1/25 BCx:   [name], PharmD 12/18/20 7:36 AM

## 2020-12-18 NOTE — Consult Note (Addendum)
Cardiology Consultation:   Patient ID: April [name] MRN: [id number]; DOB: [birth year]  Admit date: 12/16/2020 Date of Consult: 12/18/2020  Primary Care Provider: [name], MD [facility] Cardiologist: No primary care provider on file. has been seen in [place] Electrophysiologist:  None    Patient Profile:   April [name] is a 65 y.o. female with a hx of HTN, GERD, COPD, MGUS, HFpEF, SVT 01/2020, ESRD with renal transplant 2013 on immunosuppressants followed at [facility] and admitted 1.24.22 with diverticulitis after failing outpt therapy who is being seen today for the evaluation of hypotension at the request of Drs. Gonfa/Mathews.  History of Present Illness:   April [name] with above hx and admit 12/16/20 for diverticulitis after failing outpt treatment, Renal transplant on immunosuppressants developed hypotension last evening- systolic in the 55D she was warm and dry and sp02 at 99% on 3L, HR in the 80s.  Dr. Harrell Gave was on call for Cardiology and reviewed echo which was hyperdynamic.  Recommended other causes of hypotension. Her VQ was low probability for PE, she had no lactic acidosis.   In 01/2020 she had SVT and did convert to SR was on BB but now verapamil, was to see Dr. Einar Gip but never kept appt and he did not see in hospital.  Also with NSVT at that hospitalization.   From old records in care everywhere she had a neg nuc study prior to transplant in 2013 and last echo 02/2020 with normal EF and mild to moderate pulmonary hypertension.  Prior EKG in 02/2020 ST but otherwise normal.   Echo this admit with EF 70-75%, hyperdynamic, no RWMA. G2DD.  RV normal and normal PA systolic pressure. Valves were normal.  additionally she had prior diverticulitis and GI bleed and ASA was stopped.  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  Now followed by Dr. Royce Macadamia with nephrology as Dr Deterding retired.  EKG:  The EKG was personally reviewed and demonstrates:  SR RAD  no acute ST changes and no changes from 11/13/20 Telemetry:  Telemetry was personally reviewed and demonstrates:  SR with PACs.  BNP on admit 2,872 [facility]  12/16/20  IMPRESSION: Enlargement of cardiac silhouette with pulmonary vascular Congestion.  Bibasilar atelectasis and tiny pleural effusions.  today Na 130, K+ 4.7, Cr 2.52 up from 1.67  H/H yesterday 9.5/31.4 WBC 20  plts 147.  She rec'd 40 lasix on admit - I&O is not kept but today neg 700 (she is on lasix 80 mg po daily on home meds and imdur, also verapamil.     Now BP 92/51 to 94/41 resp 16 afebrile.  Will hold verapamil today, imdur stopped and no further lasix.  She did have some chest discomfort this AM across chest and no change in SOB with discomfort.  But none last night with hypotension.   Past Medical History:  Diagnosis Date  . Arthritis   . Asthma   . CHF (congestive heart failure) ([place])   . COPD (chronic obstructive pulmonary disease) ([place])   . Depression   . Essential hypertension 02/08/2020  . GERD (gastroesophageal reflux disease)   . Hyperlipidemia   . Hyperparathyroidism   . Polycystic kidney   . PONV (postoperative nausea and vomiting)     Past Surgical History:  Procedure Laterality Date  . 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 BILATERAL OOPHORECTOMY    . BIOPSY  05/19/2020   Procedure: BIOPSY;  Surgeon: Carol Ada, MD;  Location: [facility]  ENDOSCOPY;  Service: Endoscopy;;  . CARPAL TUNNEL RELEASE  2000  . CESAREAN SECTION    . COLONOSCOPY WITH PROPOFOL N/A 05/19/2020   Procedure: COLONOSCOPY WITH PROPOFOL;  Surgeon: Carol Ada, MD;  Location: [place];  Service: Endoscopy;  Laterality: N/A;  . ENTEROSCOPY N/A 05/19/2020   Procedure: ENTEROSCOPY;  Surgeon: Carol Ada, MD;  Location: [place];  Service: Endoscopy;  Laterality: N/A;  . INSERTION OF DIALYSIS CATHETER  03/31/2012   Procedure: INSERTION OF DIALYSIS CATHETER;  Surgeon: Angelia Mould, MD;  Location:  [place];  Service: Vascular;  Laterality: N/A;  insertion of dialysis catheter right internal jugular vein  . 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 ORIF TIBIA FRACTURE Left 12/23/2018   Procedure: OPEN REDUCTION INTERNAL FIXATION (ORIF) TIBIA FRACTURE;  Surgeon: Shona Needles, MD;  Location: [place];  Service: Orthopedics;  Laterality: Left;  . ORIF TIBIA PLATEAU Right 12/23/2018   Procedure: OPEN REDUCTION INTERNAL FIXATION (ORIF) TIBIAL PLATEAU;  Surgeon: Shona Needles, MD;  Location: [place];  Service: Orthopedics;  Laterality: Right;  . ORIF WRIST FRACTURE Left 12/08/2019   Procedure: OPEN REDUCTION INTERNAL FIXATION (ORIF) WRIST FRACTURE, REPAIR LACERATION LEFT FOREARM;  Surgeon: Dorna Leitz, MD;  Location: [facility];  Service: Orthopedics;  Laterality: Left;  . TONSILLECTOMY  1967  . TOTAL HIP ARTHROPLASTY Left 12/08/2019   Procedure: TOTAL HIP ARTHROPLASTY ANTERIOR APPROACH;  Surgeon: Dorna Leitz, MD;  Location: [facility];  Service: Orthopedics;  Laterality: Left;  . TUBAL LIGATION  2010     Home Medications:  Prior to Admission medications   Medication Sig Start Date End Date Taking? Authorizing Provider  acetaminophen (TYLENOL) 325 MG tablet Take 2 tablets (650 mg total) by mouth every 6 (six) hours as needed for mild pain (or Fever >/= 101). 05/22/20  Yes Elgergawy, Silver Huguenin, MD  albuterol (VENTOLIN HFA) 108 (90 Base) MCG/ACT inhaler Inhale 2 puffs into the lungs every 4 (four) hours as needed for wheezing or shortness of breath. 01/04/20  Yes Angiulli, Lavon Paganini, PA-C  allopurinol (ZYLOPRIM) 100 MG tablet Take 100 mg by mouth daily. 11/29/20  Yes [provider]  amoxicillin (AMOXIL) 500 MG capsule Take 1,000 mg by mouth See admin instructions. 1000mg  twice daily as needed for dentist 12/09/20  Yes [provider]  brimonidine (ALPHAGAN P) 0.1 % SOLN Place 1 drop into both eyes 2 (two) times daily.    Yes [provider]  Budeson-Glycopyrrol-Formoterol (BREZTRI AEROSPHERE)  160-9-4.8 MCG/ACT AERO Inhale 2 puffs into the lungs 2 (two) times daily. 08/20/20  Yes Martyn Ehrich, NP  cholecalciferol (VITAMIN D3) 25 MCG (1000 UNIT) tablet Take 1 tablet (1,000 Units total) by mouth daily. 01/04/20  Yes Angiulli, Lavon Paganini, PA-C  cyanocobalamin 1000 MCG tablet Take 1,000 mcg by mouth daily.   Yes [provider]  cycloSPORINE (RESTASIS) 0.05 % ophthalmic emulsion Place 1 drop into both eyes 2 (two) times daily. 01/04/20  Yes Angiulli, Lavon Paganini, PA-C  dorzolamide (TRUSOPT) 2 % ophthalmic solution Place 1 drop into both eyes daily. 06/25/19  Yes [provider]  ferrous sulfate 325 (65 FE) MG tablet Take 325 mg by mouth daily with breakfast.   Yes [provider]  FLUoxetine (PROZAC) 40 MG capsule Take 1 capsule (40 mg total) by mouth daily. 01/04/20  Yes Angiulli, Lavon Paganini, PA-C  furosemide (LASIX) 80 MG tablet Take 80 mg by mouth daily. 10/30/20  Yes [provider]  gabapentin (NEURONTIN) 100 MG capsule  Take 200 mg by mouth 2 (two) times daily.   Yes [provider]  guaiFENesin (MUCINEX) 600 MG 12 hr tablet Take 600 mg by mouth 2 (two) times daily.   Yes [provider]  ipratropium-albuterol (DUONEB) 0.5-2.5 (3) MG/3ML SOLN Take 3 mLs by nebulization every 6 (six) hours as needed (sob/wheezing).  08/24/20  Yes [provider]  isosorbide mononitrate (IMDUR) 60 MG 24 hr tablet Take 1 tablet (60 mg total) by mouth daily. 05/23/20  Yes Elgergawy, Silver Huguenin, MD  magnesium oxide (MAG-OX) 400 MG tablet Take 1 tablet (400 mg total) by mouth 2 (two) times daily. 01/04/20  Yes Angiulli, Lavon Paganini, PA-C  mycophenolate (MYFORTIC) 180 MG EC tablet Take 180 mg by mouth 2 (two) times daily.   Yes [provider]  pantoprazole (PROTONIX) 40 MG tablet Take 1 tablet (40 mg total) by mouth 2 (two) times daily. 01/04/20  Yes Angiulli, Lavon Paganini, PA-C  predniSONE (DELTASONE) 10 MG tablet 40mg X3 days, 30mg  X3 days, 20mg  X3 days, 10mg X3  days, then stop. Patient taking differently: Take 10-40 mg by mouth See admin instructions. 40mg X3 days, 30mg  X3 days, 20mg  X3 days, 10mg X3 days, then stop. 12/06/20  Yes Freddi Starr, MD  PROGRAF 1 MG capsule Take 1 mg by mouth 2 (two) times daily.  03/22/19  Yes [provider]  promethazine (PHENERGAN) 25 MG tablet Take 1 tablet (25 mg total) by mouth every 8 (eight) hours as needed for up to 15 doses for nausea or vomiting. 09/02/20  Yes Curatolo, Adam, DO  TRAVATAN Z 0.004 % SOLN ophthalmic solution Place 1 drop into both eyes at bedtime. 07/03/16  Yes [provider]  valACYclovir (VALTREX) 500 MG tablet Take 500 mg by mouth 2 (two) times daily.   Yes [provider]  verapamil (CALAN-SR) 240 MG CR tablet Take 240 mg by mouth daily.   Yes [provider]  ciprofloxacin (CIPRO) 500 MG tablet Take 1 tablet (500 mg total) by mouth 2 (two) times daily for 7 days. Patient not taking: Reported on 12/16/2020 12/15/20 12/22/20  Maudie Flakes, MD  metroNIDAZOLE (FLAGYL) 500 MG tablet Take 1 tablet (500 mg total) by mouth 3 (three) times daily for 7 days. Patient not taking: Reported on 12/16/2020 12/15/20 12/22/20  Maudie Flakes, MD  ondansetron (ZOFRAN ODT) 4 MG disintegrating tablet Take 1 tablet (4 mg total) by mouth every 8 (eight) hours as needed for nausea or vomiting. Patient not taking: Reported on 12/16/2020 12/15/20   Maudie Flakes, MD    Inpatient Medications: Scheduled Meds: . allopurinol  100 mg Oral Daily  . arformoterol  15 mcg Nebulization BID   And  . umeclidinium bromide  1 puff Inhalation Daily   And  . budesonide (PULMICORT) nebulizer solution  0.5 mg Nebulization BID  . brimonidine  1 drop Both Eyes BID  . cycloSPORINE  1 drop Both Eyes BID  . dorzolamide  1 drop Both Eyes Daily  . ferrous sulfate  325 mg Oral Q breakfast  . FLUoxetine  40 mg Oral Daily  . gabapentin  200 mg Oral BID  . guaiFENesin  600 mg Oral BID  . latanoprost  1  drop Both Eyes QHS  . magnesium oxide  400 mg Oral BID  . mycophenolate  180 mg Oral BID  . pantoprazole  40 mg Oral BID  . predniSONE  7.5 mg Oral Q breakfast  . tacrolimus  1 mg Oral BID  . valACYclovir  500 mg Oral BID  . verapamil  240 mg Oral Daily  . cyanocobalamin  1,000 mcg Oral Daily   Continuous Infusions: . ampicillin-sulbactam (UNASYN) IV    . heparin 1,350 Units/hr (12/18/20 0659)   PRN Meds: acetaminophen **OR** acetaminophen, bisacodyl, ipratropium-albuterol, ondansetron **OR** ondansetron (ZOFRAN) IV  Allergies:    Allergies  Allergen Reactions  . Infed [Iron Dextran] Other (See Comments)    Chest tightness  . Pentamidine Itching, Shortness Of Breath and Swelling  . Budesonide-Formoterol Fumarate     Other reaction(s): hoarseness  . Bupropion     Other reaction(s): exacerbated depression  . Crestor [Rosuvastatin]     Other reaction(s): body aches, swelling  . Duloxetine Hcl     Other reaction(s): GI SE, weepy, worsening fatigue, foggy  . Erythromycin [Erythromycin] Other (See Comments)    Mouth Ulcers  . Iohexol Other (See Comments)    Per patient "she has had a kidney transplant and should never have contrast"  . Oxycodone Nausea Only and Nausea And Vomiting  . Pravastatin Sodium     Other reaction(s): myalgias  . Erythromycin Rash    Causes breakout in mouth  . Ultram [Tramadol Hcl] Anxiety    Social History:   Social History   Socioeconomic History  . Marital status: Married    Spouse name: Not on file  . Number of children: Not on file  . Years of education: Not on file  . Highest education level: Not on file  Occupational History  . Occupation: retired  Tobacco Use  . Smoking status: Former Smoker    Packs/day: 0.25    Years: 15.00    Pack years: 3.75    Types: Cigarettes    Quit date: 03/22/1998    Years since quitting: 22.7  . Smokeless tobacco: Never Used  Vaping Use  . Vaping Use: Never used  Substance and Sexual Activity  .  Alcohol use: No  . Drug use: No  . Sexual activity: Not on file  Other Topics Concern  . Not on file  Social History Narrative   Married, lives with spouse, son and mother   2 children > son and daughter   OCCUPATION: retired Education officer, museum   Social Determinants of Radio broadcast assistant Strain: Not on Art therapist Insecurity: Not on file  Transportation Needs: Not on file  Physical Activity: Not on file  Stress: Not on file  Social Connections: Not on file  Intimate Partner Violence: Not on file    Family History:    Family History  Problem Relation Age of Onset  . Hypertension Mother   . Heart disease Father        CABG history  . Polycystic kidney disease Father   . Polycystic kidney disease Brother      ROS:  Please see the history of present illness.  General:no colds or fevers, no weight changes Skin:no rashes or ulcers HEENT:no blurred vision, no congestion CV:see HPI PUL:see HPI GI:no diarrhea constipation or melena, no indigestion GU:no hematuria, no dysuria MS:no joint pain, no claudication Neuro:no syncope, no lightheadedness Endo:no diabetes, no thyroid disease  All other ROS reviewed and negative.     Physical Exam/Data:   Vitals:   12/17/20 2248 12/18/20 0032 12/18/20 0235 12/18/20 0527  BP: (!) 99/50  (!) 92/51 (!) 94/41  Pulse: 75  69 73  Resp: 18  18 20   Temp: (!) 97.4 F (36.3 C)  98.1 F (36.7 C) (!) 97.5 F (36.4 C)  TempSrc: Oral   Oral  SpO2: 100%  100% 100%  Weight:  75 kg    Height:  5\' 1"  (1.549 m)     No intake or output data in the 24 hours ending 12/18/20 0804 Last 3 Weights 12/18/2020 12/14/2020 11/13/2020  Weight (lbs) 165 lb 5.5 oz 167 lb 167 lb  Weight (kg) 75 kg 75.751 kg 75.751 kg     Body mass index is 31.24 kg/m.  General:  Well nourished, well developed, in no acute distress HEENT: normal Lymph: no adenopathy Neck: no JVD Endocrine:  No thryomegaly Vascular: No carotid bruits; pedal pulses 1+ bilaterally    Cardiac:  normal S1, S2; RRR; no murmur gallup rub or click Lungs:  clear to auscultation bilaterally, no wheezing, rhonchi or rales  Abd: soft, nontender, no hepatomegaly  Ext: tr  Edema on Lt chronic and none on Rt Musculoskeletal:  No deformities, BUE and BLE strength normal and equal Skin: warm and dry  Neuro:  Alert and oriented X 3 MAE follows commands, no focal abnormalities noted Psych:  Normal affect   Relevant CV Studies:  ECHO 12/17/20  IMPRESSIONS    1. Left ventricular ejection fraction, by estimation, is 70 to 75%. The  left ventricle has hyperdynamic function. The left ventricle has no  regional wall motion abnormalities. Left ventricular diastolic parameters  are consistent with Grade II diastolic  dysfunction (pseudonormalization). Elevated left atrial pressure.  2. Right ventricular systolic function is normal. The right ventricular  size is normal. There is normal pulmonary artery systolic pressure.  3. The mitral valve is normal in structure. No evidence of mitral valve  regurgitation. No evidence of mitral stenosis.  4. The aortic valve is tricuspid. Aortic valve regurgitation is not  visualized. No aortic stenosis is present.  5. The inferior vena cava is normal in size with greater than 50%  respiratory variability, suggesting right atrial pressure of 3 mmHg.   FINDINGS  Left Ventricle: Left ventricular ejection fraction, by estimation, is 70  to 75%. The left ventricle has hyperdynamic function. The left ventricle  has no regional wall motion abnormalities. The left ventricular internal  cavity size was normal in size.  There is no left ventricular hypertrophy. Left ventricular diastolic  parameters are consistent with Grade II diastolic dysfunction  (pseudonormalization). Elevated left atrial pressure.   Right Ventricle: The right ventricular size is normal. Right ventricular  systolic function is normal. There is normal pulmonary artery  systolic  pressure. The tricuspid regurgitant velocity is 2.51 m/s, and with an  assumed right atrial pressure of 3 mmHg,  the estimated right ventricular systolic pressure is 50.3 mmHg.   Left Atrium: Left atrial size was normal in size.   Right Atrium: Right atrial size was normal in size.   Pericardium: There is no evidence of pericardial effusion.   Mitral Valve: The mitral valve is normal in structure. Mild mitral annular  calcification. No evidence of mitral valve regurgitation. No evidence of  mitral valve stenosis.   Tricuspid Valve: The tricuspid valve is normal in structure. Tricuspid  valve regurgitation is trivial. No evidence of tricuspid stenosis.   Aortic Valve: The aortic valve is tricuspid. Aortic valve regurgitation is  not visualized. No aortic stenosis is present.   Pulmonic Valve: The pulmonic valve was normal in structure. Pulmonic valve  regurgitation is not visualized. No evidence of pulmonic stenosis.   Aorta: The aortic root is normal in size and structure.   Venous: The inferior vena cava  is normal in size with greater than 50%  respiratory variability, suggesting right atrial pressure of 3 mmHg.   IAS/Shunts: No atrial level shunt detected by color flow Doppler.     LEFT VENTRICLE  PLAX 2D  LVIDd:     4.50 cm Diastology  LVIDs:     2.60 cm LV e' medial:  4.68 cm/s  LV PW:     0.90 cm LV E/e' medial: 20.9  LV IVS:    0.90 cm LV e' lateral:  9.68 cm/s  LVOT diam:   2.00 cm LV E/e' lateral: 10.1  LV SV:     79  LV SV Index:  45  LVOT Area:   3.14 cm     RIGHT VENTRICLE  RV S prime:   13.70 cm/s  TAPSE (M-mode): 1.5 cm   LEFT ATRIUM      Index    RIGHT ATRIUM      Index  LA diam:   4.50 cm 2.57 cm/m RA Area:   12.10 cm  LA Vol (A2C): 46.3 ml 26.47 ml/m RA Volume:  23.40 ml 13.38 ml/m  LA Vol (A4C): 51.8 ml 29.61 ml/m  AORTIC VALVE  LVOT Vmax:  187.00 cm/s  LVOT Vmean:  134.000 cm/s  LVOT VTI:  0.253 m    AORTA  Ao Root diam: 3.40 cm  Ao Asc diam: 3.40 cm   MITRAL VALVE        TRICUSPID VALVE  MV Area (PHT): 2.60 cm  TR Peak grad:  25.2 mmHg  MV Decel Time: 292 msec  TR Vmax:    251.00 cm/s  MV E velocity: 97.90 cm/s  MV A velocity: 92.40 cm/s SHUNTS  MV E/A ratio: 1.06    Systemic VTI: 0.25 m               Systemic Diam: 2.00 cm   Echo 03/01/20 at [place]  A two-dimensional transthoracic echocardiogram with color flow and  Doppler was performed. Image Quality: Technically difficult.  -  SUMMARY  Left ventricular systolic function is normal.  LV ejection fraction = 55-60%.  There is mild tricuspid regurgitation.  Mild to moderate pulmonary hypertension.  There is no comparison study available.   -  FINDINGS:  LEFT VENTRICLE  The left ventricular size is normal. LV ejection fraction = 55-60%.  Left ventricular systolic function is normal.   -  RIGHT VENTRICLE  The right ventricle is normal size. The right ventricular systolic  function is normal.   LEFT ATRIUM  The left atrial size is normal.   RIGHT ATRIUM  Right atrial size is normal.  -  AORTIC VALVE  The aortic valve is normal in structure and function. The aortic valve  is trileaflet. There is no aortic stenosis. There is trace aortic  regurgitation.  -  MITRAL VALVE  The mitral valve is normal in structure and function. There is no  mitral regurgitation noted.  -  TRICUSPID VALVE  The tricuspid valve is normal in structure and function. There is mild  tricuspid regurgitation. Mild to moderate pulmonary hypertension.  -  PULMONIC VALVE  The pulmonic valve is not well visualized.  -  ARTERIES  The aortic sinus is normal size.  -  -  EFFUSION  There is no pericardial effusion.  -   Nuc study 2013   FINAL COMMENTS  1. Normal study. Negative for regadenoson induced ischemia.  2. Normal left  ventricular wall motion andthickening. Normal LVEF >  65%.    Laboratory Data:  High Sensitivity Troponin:  No results for input(s): TROPONINIHS in the last 720 hours.   Chemistry Recent Labs  Lab 12/16/20 0952 12/17/20 0425 12/18/20 0604  NA 134* 133* 130*  K 4.2 4.1 4.7  CL 98 100 96*  CO2 19* 21* 15*  GLUCOSE 68* 96 86  BUN 39* 41* 51*  CREATININE 1.33* 1.67* 2.52*  CALCIUM 9.6 8.4* 8.4*  GFRNONAA 45* 34* 21*  ANIONGAP 17* 12 19*    Recent Labs  Lab 12/16/20 0952 12/17/20 0425 12/18/20 0604  PROT 7.8 6.7 6.7  ALBUMIN 3.3* 2.8* 2.8*  AST 21 17 15   ALT 15 12 11   ALKPHOS 66 58 50  BILITOT 3.1* 2.2* 1.3*   Hematology Recent Labs  Lab 12/14/20 2242 12/16/20 0952 12/17/20 0425  WBC 9.6 20.8* 20.1*  RBC 3.61* 3.84* 3.04*  HGB 11.3* 12.2 9.5*  HCT 37.3 39.3 31.4*  MCV 103.3* 102.3* 103.3*  MCH 31.3 31.8 31.3  MCHC 30.3 31.0 30.3  RDW 19.1* 19.5* 19.3*  PLT 187 181 147*   BNP Recent Labs  Lab 12/16/20 0952  BNP 2,872.2*    DDimer No results for input(s): DDIMER in the last 168 hours.   Radiology/Studies:  CT ABDOMEN PELVIS WO CONTRAST  Result Date: 12/16/2020 CLINICAL DATA:  Diverticulitis suspected. Abdominal pain in the left lower quadrant. EXAM: CT ABDOMEN AND PELVIS WITHOUT CONTRAST TECHNIQUE: Multidetector CT imaging of the abdomen and pelvis was performed following the standard protocol without IV contrast. COMPARISON:  CT dated December 14, 2020. FINDINGS: Lower chest: There are linear bands of scarring at the lung bases bilaterally. There is a trace right-sided pleural effusion.The heart size is mildly enlarged. Hepatobiliary: Innumerable cysts are noted throughout the patient's liver. Normal gallbladder.There is no biliary ductal dilation. Pancreas: Normal contours without ductal dilatation. No peripancreatic fluid collection. Spleen: Unremarkable. Adrenals/Urinary Tract: --Adrenal glands: Unremarkable. --Right kidney/ureter: The right kidney is  enlarged with multiple cysts. --Left kidney/ureter: The left kidney is enlarged with multiple cysts. 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 --Urinary bladder: Unremarkable. Stomach/Bowel: --Stomach/Duodenum: No hiatal hernia or other gastric abnormality. Normal duodenal course and caliber. --Small bowel: Unremarkable. --Colon: Again noted are findings of mild uncomplicated sigmoid diverticulitis (axial series 2, image 74). Innumerable diverticula are noted throughout the remaining portions of the colon. --Appendix: Normal. Vascular/Lymphatic: Atherosclerotic calcification is present within the non-aneurysmal abdominal aorta, without hemodynamically significant stenosis. --No retroperitoneal lymphadenopathy. --No mesenteric lymphadenopathy. --No pelvic or inguinal lymphadenopathy. Reproductive: Unremarkable Other: No ascites or free air. There is a fat containing umbilical hernia. Musculoskeletal. There is no acute fracture. There is an old healed fracture of the sacrum. The patient is status post prior total hip arthroplasty on the left. There is an old healed fracture of the left inferior pubic ramus. There is an old healed fracture of the superior pubic ramus on the left. There are old healed bilateral rib fractures. There are old bilateral sacral insufficiency fractures. IMPRESSION: 1. Persistent findings of mild uncomplicated sigmoid diverticulitis. 2. Polycystic kidney and liver disease as before. Unremarkable appearance of the right pelvic transplant kidney. 3. Fat containing umbilical hernia. 4. Trace right-sided pleural effusion. 5. Chronic osseous findings as detailed above. Aortic Atherosclerosis (ICD10-I70.0). Electronically Signed   By: Constance Holster M.D.   On: 12/16/2020 21:22   CT ABDOMEN PELVIS WO CONTRAST  Result Date: 12/15/2020 CLINICAL DATA:  Fever, flank pain, status post renal transplantation.  Low back pain. EXAM:  CT ABDOMEN AND PELVIS WITHOUT CONTRAST CT LUMBAR SPINE WITHOUT CONTRAST TECHNIQUE: Multidetector CT imaging of the abdomen and pelvis was performed following the standard protocol without IV contrast. Subsequently, dedicated CT imaging of the lumbar spine was performed without contrast administration. Coronal and sagittal reformats were created. CT dose reduction techniques including automated exposure control and iterative reconstruction were utilized where possible. COMPARISON:  09/04/2020 FINDINGS: Lower chest: Mild bibasilar atelectasis. Trace right pleural effusion. Cardiac size within normal limits. Trace pericardial fluid is likely physiologic. 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 Dominant cyst within the left hepatic lobe is unchanged measuring 3.2 cm. Liver size within normal limits. Progressive mild intrahepatic biliary ductal dilation within segment 3 of the liver likely related to multiple central cysts within the left hepatic lobe. Stable mild peripheral biliary ductal dilation within segment 7 of the liver. No extrahepatic biliary ductal dilation. Gallbladder unremarkable. Pancreas: Unremarkable Spleen: Unremarkable Adrenals/Urinary Tract: The adrenal glands are unremarkable. The native kidneys are normal in position and are enlarged with innumerable cortical cysts identified in keeping with changes of polycystic kidney disease. More solid-appearing 3.2 cm rounded lesion within the lower pole of the right kidney, axial image # 50/2, appears smaller than on prior examination and likely represents a hyperdense renal cyst. No hydronephrosis. 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 Normal size and cortical thickness. No hydronephrosis. No intrarenal calcifications. Evaluation of the pelvis is limited by streak artifact from left total hip arthroplasty. The visualized bladder is  decompressed. A a punctate focus of nondependent gas within the bladder lumen is nonspecific, possibly related to recent catheterization. Stomach/Bowel: Moderate to severe sigmoid diverticulosis. There is superimposed pericolonic inflammatory change identified within the mid sigmoid colon in keeping with changes of mild sigmoid diverticulitis. No free intraperitoneal gas or loculated intra-abdominal fluid collections. No evidence of obstruction. The stomach, small bowel, and large bowel are otherwise unremarkable. Appendix normal. No free intraperitoneal fluid. Vascular/Lymphatic: Moderate aortoiliac atherosclerotic calcification. No aortic aneurysm. No pathologic adenopathy within the abdomen and pelvis. Reproductive: Uterus and bilateral adnexa are unremarkable. Other: Small fat containing umbilical hernia. Broad-based right flank incisional hernia containing unremarkable loops of large and small bowel as well as the appendix. 2.6 cm left inguinal soft tissue nodule is again identified and is unchanged from prior examination, indeterminate. Musculoskeletal: Left total hip arthroplasty has been performed. Chronic fractures of the sacral ala bilaterally are again identified. No acute bone abnormality. Sagittal and coronal reformats of the lumbar spine demonstrate normal lumbar lordosis. No acute fracture or listhesis of the lumbar spine. Chronic angulated fracture deformity of S2 is again seen, unchanged from prior CT examination. Chronic bilateral sacral ale are fractures identified, also unchanged from prior examination. Vertebral body height and intervertebral disc heights are preserved. Review of the axial images demonstrates: T12-L1: Intervertebral disc height preserved. No significant canal stenosis. No neural foraminal narrowing. No significant facet arthrosis. L1-2: Disc height preserved. No significant canal stenosis. No neural foraminal narrowing. No significant facet arthrosis. L2-3: Disc height  preserved. Spinal canal is widely patent. No significant neural foraminal narrowing. No significant facet arthrosis. L3-4: Disc height preserved. Mild broad-based posterior disc bulge. Mild hypertrophy of the lamina propria. Mild resultant central canal stenosis. No significant neural foraminal narrowing. L4-5: Disc height preserved. Mild broad-based posterior disc bulge. Moderate bilateral facet arthrosis. Mild hypertrophy of the lamina propria. Moderate resultant central canal stenosis. No significant neural foraminal  narrowing. Possible impingement of the traversing L5 nerve roots bilaterally within the lateral recess. This is not well assessed on this examination. L5-S1: Disc height preserved. Spinal canal widely patent. No significant neural foraminal narrowing. Mild bilateral facet arthrosis. IMPRESSION: Innumerable bilateral renal cortical cysts and multiple hepatic cysts in keeping with age is of autosomal dominant polycystic kidney disease. Normal appearance of the a right lower quadrant transplant kidney. Mild, uncomplicated sigmoid diverticulitis. Background moderate to severe sigmoid diverticulosis. Chronic bilateral sacral ala and angulated S2 fractures. No acute fracture or listhesis of the lumbar spine. Aortic Atherosclerosis (ICD10-I70.0). Electronically Signed   By: Fidela Salisbury MD   On: 12/15/2020 00:50   NM Pulmonary Perfusion  Result Date: 12/17/2020 CLINICAL DATA:  Positive D-dimer. EXAM: NUCLEAR MEDICINE PERFUSION LUNG SCAN TECHNIQUE: Perfusion images were obtained in multiple projections after intravenous injection of radiopharmaceutical. Ventilation scans intentionally deferred if perfusion scan and chest x-ray adequate for interpretation during COVID 19 epidemic. RADIOPHARMACEUTICALS:  4.0 mCi Tc-28m MAA IV COMPARISON:  Chest x-ray 12/16/2020. FINDINGS: Perfusion study only performed. No prominent segmental defect noted to suggest pulmonary embolus. Low probability pulmonary embolus.  IMPRESSION: Low probability pulmonary embolus. Electronically Signed   By: Marcello Moores  Register   On: 12/17/2020 15:30   CT L-SPINE NO CHARGE  Result Date: 12/15/2020 CLINICAL DATA:  Fever, flank pain, status post renal transplantation. Low back pain. EXAM: CT ABDOMEN AND PELVIS WITHOUT CONTRAST CT LUMBAR SPINE WITHOUT CONTRAST TECHNIQUE: Multidetector CT imaging of the abdomen and pelvis was performed following the standard protocol without IV contrast. Subsequently, dedicated CT imaging of the lumbar spine was performed without contrast administration. Coronal and sagittal reformats were created. CT dose reduction techniques including automated exposure control and iterative reconstruction were utilized where possible. COMPARISON:  09/04/2020 FINDINGS: Lower chest: Mild bibasilar atelectasis. Trace right pleural effusion. Cardiac size within normal limits. Trace pericardial fluid is likely physiologic. 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 Dominant cyst within the left hepatic lobe is unchanged measuring 3.2 cm. Liver size within normal limits. Progressive mild intrahepatic biliary ductal dilation within segment 3 of the liver likely related to multiple central cysts within the left hepatic lobe. Stable mild peripheral biliary ductal dilation within segment 7 of the liver. No extrahepatic biliary ductal dilation. Gallbladder unremarkable. Pancreas: Unremarkable Spleen: Unremarkable Adrenals/Urinary Tract: The adrenal glands are unremarkable. The native kidneys are normal in position and are enlarged with innumerable cortical cysts identified in keeping with changes of polycystic kidney disease. More solid-appearing 3.2 cm rounded lesion within the lower pole of the right kidney, axial image # 50/2, appears smaller than on prior examination and likely represents a hyperdense renal cyst. No hydronephrosis. Transplant  kidney identified within the right iliac fossa. Normal size and cortical thickness. No hydronephrosis. No intrarenal calcifications. Evaluation of the pelvis is limited by streak artifact from left total hip arthroplasty. The visualized bladder is decompressed. A a punctate focus of nondependent gas within the bladder lumen is nonspecific, possibly related to recent catheterization. Stomach/Bowel: Moderate to severe sigmoid diverticulosis. There is superimposed pericolonic inflammatory change identified within the mid sigmoid colon in keeping with changes of mild sigmoid diverticulitis. No free intraperitoneal gas or loculated intra-abdominal fluid collections. No evidence of obstruction. The stomach, small bowel, and large bowel are otherwise unremarkable. Appendix normal. No free intraperitoneal fluid. Vascular/Lymphatic: Moderate aortoiliac atherosclerotic calcification. No aortic aneurysm. No pathologic adenopathy within the abdomen and pelvis. Reproductive: Uterus and bilateral adnexa  are unremarkable. Other: Small fat containing umbilical hernia. Broad-based right flank incisional hernia containing unremarkable loops of large and small bowel as well as the appendix. 2.6 cm left inguinal soft tissue nodule is again identified and is unchanged from prior examination, indeterminate. Musculoskeletal: Left total hip arthroplasty has been performed. Chronic fractures of the sacral ala bilaterally are again identified. No acute bone abnormality. Sagittal and coronal reformats of the lumbar spine demonstrate normal lumbar lordosis. No acute fracture or listhesis of the lumbar spine. Chronic angulated fracture deformity of S2 is again seen, unchanged from prior CT examination. Chronic bilateral sacral ale are fractures identified, also unchanged from prior examination. Vertebral body height and intervertebral disc heights are preserved. Review of the axial images demonstrates: T12-L1: Intervertebral disc height  preserved. No significant canal stenosis. No neural foraminal narrowing. No significant facet arthrosis. L1-2: Disc height preserved. No significant canal stenosis. No neural foraminal narrowing. No significant facet arthrosis. L2-3: Disc height preserved. Spinal canal is widely patent. No significant neural foraminal narrowing. No significant facet arthrosis. L3-4: Disc height preserved. Mild broad-based posterior disc bulge. Mild hypertrophy of the lamina propria. Mild resultant central canal stenosis. No significant neural foraminal narrowing. L4-5: Disc height preserved. Mild broad-based posterior disc bulge. Moderate bilateral facet arthrosis. Mild hypertrophy of the lamina propria. Moderate resultant central canal stenosis. No significant neural foraminal narrowing. Possible impingement of the traversing L5 nerve roots bilaterally within the lateral recess. This is not well assessed on this examination. L5-S1: Disc height preserved. Spinal canal widely patent. No significant neural foraminal narrowing. Mild bilateral facet arthrosis. IMPRESSION: Innumerable bilateral renal cortical cysts and multiple hepatic cysts in keeping with age is of autosomal dominant polycystic kidney disease. Normal appearance of the a right lower quadrant transplant kidney. Mild, uncomplicated sigmoid diverticulitis. Background moderate to severe sigmoid diverticulosis. Chronic bilateral sacral ala and angulated S2 fractures. No acute fracture or listhesis of the lumbar spine. Aortic Atherosclerosis (ICD10-I70.0). Electronically Signed   By: Fidela Salisbury MD   On: 12/15/2020 00:50   DG Chest Port 1 View  Result Date: 12/16/2020 CLINICAL DATA:  Weakness, pleural effusions, LEFT lower quadrant abdominal pain, nausea, dry heaving, pleural effusions by prior radiograph EXAM: PORTABLE CHEST 1 VIEW COMPARISON:  Portable exam 1205 hours compared to 12/14/2020 FINDINGS: Borderline enlargement of cardiac silhouette with slight vascular  congestion. Mediastinal contours normal. Bibasilar atelectasis and tiny pleural effusions. No acute infiltrate/edema or pneumothorax. Bones demineralized. IMPRESSION: Enlargement of cardiac silhouette with pulmonary vascular congestion. Bibasilar atelectasis and tiny pleural effusions. Electronically Signed   By: Lavonia Dana M.D.   On: 12/16/2020 12:27   DG Chest Port 1 View  Result Date: 12/15/2020 CLINICAL DATA:  Fever and flank pain. Nausea. Previous kidney transplant. EXAM: PORTABLE CHEST 1 VIEW COMPARISON:  11/13/2020 FINDINGS: Shallow inspiration with atelectasis in the lung bases. Mild cardiac enlargement. No vascular congestion or edema. Blunting of the costophrenic angles suggesting small effusions. No pneumothorax. IMPRESSION: Shallow inspiration with atelectasis in the lung bases. Small bilateral pleural effusions. Electronically Signed   By: Lucienne Capers M.D.   On: 12/15/2020 00:14   ECHOCARDIOGRAM COMPLETE  Result Date: 12/17/2020    ECHOCARDIOGRAM REPORT   Patient Name:   ALIVIYAH MALANGA Tenaglia Date of Exam: 12/17/2020 Medical Rec #:  [id number]         Height:       61.0 in Accession #:    [id number]        Weight:       167.0 lb  Date of Birth:  1955-12-30         BSA:          1.749 m Patient Age:    74 years          BP:           105/56 mmHg Patient Gender: F                 HR:           98 bpm. Exam Location:  Inpatient Procedure: 2D Echo, Color Doppler and Cardiac Doppler Indications:    CHF-Acute Diastolic P23.30  History:        Patient has prior history of Echocardiogram examinations, most                 recent 04/17/2020. CHF, COPD; Risk Factors:Hypertension,                 Dyslipidemia and Diabetes.  Sonographer:    Bernadene Person RDCS Referring Phys: [id number] [place]  1. Left ventricular ejection fraction, by estimation, is 70 to 75%. The left ventricle has hyperdynamic function. The left ventricle has no regional wall motion abnormalities. Left ventricular  diastolic parameters are consistent with Grade II diastolic dysfunction (pseudonormalization). Elevated left atrial pressure.  2. Right ventricular systolic function is normal. The right ventricular size is normal. There is normal pulmonary artery systolic pressure.  3. The mitral valve is normal in structure. No evidence of mitral valve regurgitation. No evidence of mitral stenosis.  4. The aortic valve is tricuspid. Aortic valve regurgitation is not visualized. No aortic stenosis is present.  5. The inferior vena cava is normal in size with greater than 50% respiratory variability, suggesting right atrial pressure of 3 mmHg. FINDINGS  Left Ventricle: Left ventricular ejection fraction, by estimation, is 70 to 75%. The left ventricle has hyperdynamic function. The left ventricle has no regional wall motion abnormalities. The left ventricular internal cavity size was normal in size. There is no left ventricular hypertrophy. Left ventricular diastolic parameters are consistent with Grade II diastolic dysfunction (pseudonormalization). Elevated left atrial pressure. Right Ventricle: The right ventricular size is normal. Right ventricular systolic function is normal. There is normal pulmonary artery systolic pressure. The tricuspid regurgitant velocity is 2.51 m/s, and with an assumed right atrial pressure of 3 mmHg,  the estimated right ventricular systolic pressure is 33.5 mmHg. Left Atrium: Left atrial size was normal in size. Right Atrium: Right atrial size was normal in size. Pericardium: There is no evidence of pericardial effusion. Mitral Valve: The mitral valve is normal in structure. Mild mitral annular calcification. No evidence of mitral valve regurgitation. No evidence of mitral valve stenosis. Tricuspid Valve: The tricuspid valve is normal in structure. Tricuspid valve regurgitation is trivial. No evidence of tricuspid stenosis. Aortic Valve: The aortic valve is tricuspid. Aortic valve regurgitation is not  visualized. No aortic stenosis is present. Pulmonic Valve: The pulmonic valve was normal in structure. Pulmonic valve regurgitation is not visualized. No evidence of pulmonic stenosis. Aorta: The aortic root is normal in size and structure. Venous: The inferior vena cava is normal in size with greater than 50% respiratory variability, suggesting right atrial pressure of 3 mmHg. IAS/Shunts: No atrial level shunt detected by color flow Doppler.  LEFT VENTRICLE PLAX 2D LVIDd:         4.50 cm  Diastology LVIDs:         2.60 cm  LV e' medial:    4.68  cm/s LV PW:         0.90 cm  LV E/e' medial:  20.9 LV IVS:        0.90 cm  LV e' lateral:   9.68 cm/s LVOT diam:     2.00 cm  LV E/e' lateral: 10.1 LV SV:         79 LV SV Index:   45 LVOT Area:     3.14 cm  RIGHT VENTRICLE RV S prime:     13.70 cm/s TAPSE (M-mode): 1.5 cm LEFT ATRIUM           Index       RIGHT ATRIUM           Index LA diam:      4.50 cm 2.57 cm/m  RA Area:     12.10 cm LA Vol (A2C): 46.3 ml 26.47 ml/m RA Volume:   23.40 ml  13.38 ml/m LA Vol (A4C): 51.8 ml 29.61 ml/m  AORTIC VALVE LVOT Vmax:   187.00 cm/s LVOT Vmean:  134.000 cm/s LVOT VTI:    0.253 m  AORTA Ao Root diam: 3.40 cm Ao Asc diam:  3.40 cm MITRAL VALVE               TRICUSPID VALVE MV Area (PHT): 2.60 cm    TR Peak grad:   25.2 mmHg MV Decel Time: 292 msec    TR Vmax:        251.00 cm/s MV E velocity: 97.90 cm/s MV A velocity: 92.40 cm/s  SHUNTS MV E/A ratio:  1.06        Systemic VTI:  0.25 m                            Systemic Diam: 2.00 cm Kirk Ruths MD Electronically signed by Kirk Ruths MD Signature Date/Time: 12/17/2020/11:23:38 AM    Final    VAS [place] LOWER EXTREMITY VENOUS (DVT)  Result Date: 12/17/2020  Lower Venous DVT Study Indications: Swelling L>R.  Risk Factors: Limited mobility due to O2 dependence. Comparison Study: Previous exam - 01/14/20 (negative) Performing Technologist: Rogelia Rohrer  Examination Guidelines: A complete evaluation includes B-mode imaging,  spectral Doppler, color Doppler, and power Doppler as needed of all accessible portions of each vessel. Bilateral testing is considered an integral part of a complete examination. Limited examinations for reoccurring indications may be performed as noted. The reflux portion of the exam is performed with the patient in reverse Trendelenburg.  +---------+---------------+---------+-----------+----------+-------------------+ RIGHT    CompressibilityPhasicitySpontaneityPropertiesThrombus Aging      +---------+---------------+---------+-----------+----------+-------------------+ CFV      Full           Yes      Yes                                      +---------+---------------+---------+-----------+----------+-------------------+ SFJ      Full                                                             +---------+---------------+---------+-----------+----------+-------------------+ FV Prox  Full           Yes      Yes                                      +---------+---------------+---------+-----------+----------+-------------------+  FV Mid   Full           Yes      Yes                                      +---------+---------------+---------+-----------+----------+-------------------+ FV DistalFull           Yes      Yes                                      +---------+---------------+---------+-----------+----------+-------------------+ PFV      Full                                                             +---------+---------------+---------+-----------+----------+-------------------+ POP      Partial        Yes      Yes                  Age Indeterminate   +---------+---------------+---------+-----------+----------+-------------------+ PTV      Full                                         Not well visualized +---------+---------------+---------+-----------+----------+-------------------+ PERO     Full                                                              +---------+---------------+---------+-----------+----------+-------------------+   +---------+---------------+---------+-----------+----------+-------------------+ LEFT     CompressibilityPhasicitySpontaneityPropertiesThrombus Aging      +---------+---------------+---------+-----------+----------+-------------------+ CFV      Full           Yes      Yes                                      +---------+---------------+---------+-----------+----------+-------------------+ SFJ      Full                                                             +---------+---------------+---------+-----------+----------+-------------------+ FV Prox  Full           Yes      Yes                                      +---------+---------------+---------+-----------+----------+-------------------+ FV Mid   Full           Yes      Yes                                      +---------+---------------+---------+-----------+----------+-------------------+  FV DistalFull           Yes      Yes                                      +---------+---------------+---------+-----------+----------+-------------------+ PFV      Full                                                             +---------+---------------+---------+-----------+----------+-------------------+ POP      Full           Yes      Yes                                      +---------+---------------+---------+-----------+----------+-------------------+ PTV      Full                                         Not well visualized +---------+---------------+---------+-----------+----------+-------------------+ PERO     Full                                                             +---------+---------------+---------+-----------+----------+-------------------+     Summary: BILATERAL: -No evidence of popliteal cyst, bilaterally. RIGHT: - Findings consistent with age indeterminate, non occlusive deep vein  thrombosis involving the right popliteal vein. Subcutaneous edema  LEFT: - There is no evidence of deep vein thrombosis in the lower extremity.  Subcutaneous edema.  *See table(s) above for measurements and observations. Electronically signed by Deitra Mayo MD on 12/17/2020 at 7:47:55 AM.    Final      Assessment and Plan:   1. Hypotension may be with dehydration, Cr is now elevated.  She did have elevated BNP on arrival but also with N&V.  She rec'd 1 dose of lasix IV but no I&O.  She has hx of diastolic HF and has hyperdynamic EF on echo this admit.  Her CXR with HF as well.  Will check troponin Dr. Sallyanne Kuster to see. 2. Acute colitis per IM on ABX hx of diverticulits 3. AKI with hx renal transplant. On cell cept and prograf.  Had been off meds for 2-3 days with N&V. 4. 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 Dates back to 01/2020 with acute lung asthma exacerbation.  None this admit, hold verapamil with hypotension resume prior to discharge 5. Chronic COPD /asthma- I only see asthma from chart   Risk Assessment/Risk Scores:                For questions or updates, please contact [facility] Please consult [email or web address] for contact info under    Signed, Cecilie Kicks, NP  12/18/2020 8:04 AM   I have seen and examined the patient along with Cecilie Kicks, NP .  I have reviewed the chart, notes and new data.  I agree with PA/NP's note.  Key new complaints:  Tired and weak, but denies angina, syncope/presyncope or palpitations.  Has chronic shortness of breath.  She required intravenous adenosine for 2 episodes of SVT earlier this year on 03/10 and 04/21.  Each time she converted to sinus rhythm promptly. Key examination changes: Appears chronically ill and pale, has a normal cardiac exam, bruit heard over the left carotid and left subclavian area, radiating from her left antecubital AV fistula Key new findings / data: No significant arrhythmia on telemetry.  The echo reports diastolic dysfunction  grade 2, but there is no left ventricular hypertrophy and there is at most borderline left atrial dilation.  Records from previous ED visits in March and April reviewed, including review of multiple ECG tracings.  None of the tracings from 03/10 captured the SVT, but on 04/21, there is evidence of narrow complex short RP tachycardia with a distinct negative P wave on the terminal S wave, highly suggestive of AV node reentry.  There is no evidence of preexcitation on the ECG in sinus rhythm.   PLAN: Hypotension and accompanying AKI is due to the active infection/SIRS.  Seems to be improving with volume administration.  No evidence of hypervolemia and no evidence of active problems with heart failure, despite purported diagnosis of diastolic heart failure and the echo findings suggesting "pseudonormal mitral inflow". 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  I assume she was switched from beta-blockers to verapamil due to her underlying asthma and Raynaud's syndrome.  Verapamil and diltiazem are not great choices due to their bountiful drug interactions, including with her Prograf. Also holding the isosorbide mononitrate.  She does not have coronary artery disease/angina and takes it for Raynaud's syndrome, according to notes from the [facility] transplant service. 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  This could explain her tendency to hypotension during acute illness, in particular during acute infection.  She would benefit from stress doses of steroids if her blood pressure does not recover promptly.  Sanda Klein, MD, [place] [phone] 12/18/2020, 4:07 PM

## 2020-12-18 NOTE — Plan of Care (Signed)

## 2020-12-18 NOTE — Progress Notes (Signed)
CC: Abdominal pain, nausea, dry heaves.  Subjective: Patient is lying in bed, breathing heavily some wheezing, she has a nebulizer on now.  She seems more short of breath this AM.  She denies any abdominal pain.   Objective: Vital signs in last 24 hours: Temp:  [97.4 F (36.3 C)-98.1 F (36.7 C)] 97.5 F (36.4 C) (01/26 0527) Pulse Rate:  [69-105] 73 (01/26 0527) Resp:  [14-29] 20 (01/26 0527) BP: (81-136)/(41-68) 94/41 (01/26 0527) SpO2:  [97 %-100 %] 100 % (01/26 0527) Weight:  [75 kg] 75 kg (01/26 0032) Last BM Date: 12/13/20 No p.o. intake or IV intake recorded 700 urine No BM reported Afebrile vital signs are stable blood pressure in the 90s. ECHO 12/17/20: 1. Left ventricular ejection fraction, by estimation, is 70 to 75%. The left ventricle has hyperdynamic function. The left ventricle has no regional wall motion abnormalities. Left ventricular diastolic parameters are consistent with Grade II diastolic dysfunction (pseudonormalization). Elevated left atrial pressure. 2. Right ventricular systolic function is normal. The right ventricular size is normal. There is normal pulmonary artery systolic pressure. 3. The mitral valve is normal in structure. No evidence of mitral valve regurgitation. No evidence of mitral stenosis. 4. The aortic valve is tricuspid. Aortic valve regurgitation is not visualized. No aortic stenosis is present. 5. The inferior vena cava is normal in size with greater than 50% respiratory variability, suggesting right atrial pressure of 3 mmHg.  Intake/Output from previous day: 01/25 0701 - 01/26 0700 In: -  Out: 700 [Urine:700] Intake/Output this shift: No intake/output data recorded.  General appearance: alert, cooperative and appears SOB, with nebulizer mask on now.   Resp: rales both lungs, some wheezing GI: soft, non-tender; bowel sounds normal; no masses,  no organomegaly  Lab Results:  Recent Labs    12/16/20 0952 12/17/20 0425   WBC 20.8* 20.1*  HGB 12.2 9.5*  HCT 39.3 31.4*  PLT 181 147*    BMET Recent Labs    12/17/20 0425 12/18/20 0604  NA 133* 130*  K 4.1 4.7  CL 100 96*  CO2 21* 15*  GLUCOSE 96 86  BUN 41* 51*  CREATININE 1.67* 2.52*  CALCIUM 8.4* 8.4*   PT/INR No results for input(s): LABPROT, INR in the last 72 hours.  Recent Labs  Lab 12/14/20 2242 12/16/20 0952 12/17/20 0425 12/18/20 0604  AST 17 21 17 15   ALT 15 15 12 11   ALKPHOS 61 66 58 50  BILITOT 1.9* 3.1* 2.2* 1.3*  PROT 7.8 7.8 6.7 6.7  ALBUMIN 3.3* 3.3* 2.8* 2.8*     Lipase     Component Value Date/Time   LIPASE 21 12/16/2020 0952     Medications: . allopurinol  100 mg Oral Daily  . arformoterol  15 mcg Nebulization BID   And  . umeclidinium bromide  1 puff Inhalation Daily   And  . budesonide (PULMICORT) nebulizer solution  0.5 mg Nebulization BID  . brimonidine  1 drop Both Eyes BID  . cycloSPORINE  1 drop Both Eyes BID  . dorzolamide  1 drop Both Eyes Daily  . ferrous sulfate  325 mg Oral Q breakfast  . FLUoxetine  40 mg Oral Daily  . gabapentin  200 mg Oral BID  . guaiFENesin  600 mg Oral BID  . latanoprost  1 drop Both Eyes QHS  . magnesium oxide  400 mg Oral BID  . mycophenolate  180 mg Oral BID  . pantoprazole  40 mg Oral BID  .  predniSONE  7.5 mg Oral Q breakfast  . tacrolimus  1 mg Oral BID  . valACYclovir  500 mg Oral BID  . verapamil  240 mg Oral Daily  . cyanocobalamin  1,000 mcg Oral Daily   . ampicillin-sulbactam (UNASYN) IV    . heparin 1,350 Units/hr (12/18/20 0762)    Assessment/Plan Polycystic disease with renal transplant 2013 -Prograf/prednisone. Renal consult? Elevated Cr - Cr  2.63>>3.35>>4.56 CHF/uncertain etiology -[id number], CXR with pulm vascular congestion. Echo ordred by [facility] Chronic COPD/history of tobacco use - On 2L PRN at home. Currently on 4L Pleuritic chest pain and SOB   - VQ 1/25:  Low probability pulmonary embolus. Age indeterminate, non occlusive deep vein  thrombosis involving the right popliteal vein Hx SVT Hypoglycemia  COVID status - Ag negative. PCR has not been performed  - Per [facility] -    Umbilical hernia - CT with fat containing umbilical hernia. This is reducible at bedside but spontaneously reoccurs   Diverticulitis - Dx 1/22 and failed outpatient tx. CT 7/34 w/ mild uncomplicated sigmoid diverticulitis. No abscess.  - WBC 20.8>>20.1(1/25) - Cont IV and bowel rest.  - No indication for emergency surgery - Hopefully will be able to avoid surgery during admission that would likely result in a colostomy  - We will follow with you  FEN:NPO ID:  FlagylCipro 1/24; Zosyn 1/24-1/25; Unasyn 1/25>> day 2 DVT: Heparin drip  Her abdomen is soft and nontender this AM.  She has been switched over to Unasyn for her antibiotic.  Her major issue this a.m. seems to be respiratory.  I have ordered a repeat chest x-ray, and blood gases stat, repeated her BNP.  From our standpoint I would put her on clear liquids after her respiratory status is sorted out.  I will let Dr. Zigmund Daniel know about this also.      LOS: 2 days    Evangelynn Lochridge 12/18/2020 Please see Amion

## 2020-12-18 NOTE — Progress Notes (Signed)
ANTICOAGULATION CONSULT NOTE -   Pharmacy Consult for IV heparin  Indication: empiric treatment of PE, pending VQ scan   Allergies  Allergen Reactions  . Infed [Iron Dextran] Other (See Comments)    Chest tightness  . Pentamidine Itching, Shortness Of Breath and Swelling  . Budesonide-Formoterol Fumarate     Other reaction(s): hoarseness  . Bupropion     Other reaction(s): exacerbated depression  . Crestor [Rosuvastatin]     Other reaction(s): body aches, swelling  . Duloxetine Hcl     Other reaction(s): GI SE, weepy, worsening fatigue, foggy  . Erythromycin [Erythromycin] Other (See Comments)    Mouth Ulcers  . Iohexol Other (See Comments)    Per patient "she has had a kidney transplant and should never have contrast"  . Oxycodone Nausea Only and Nausea And Vomiting  . Pravastatin Sodium     Other reaction(s): myalgias  . Erythromycin Rash    Causes breakout in mouth  . Ultram [Tramadol Hcl] Anxiety    Patient Measurements: Height: 5\' 1"  (154.9 cm) Weight: 75 kg (165 lb 5.5 oz) IBW/kg (Calculated) : 47.8  Height: 5\' 1"  Weight: 75.8 kg Heparin Dosing Weight: 64.6 kg  Vital Signs: Temp: 97.5 F (36.4 C) (01/26 0527) Temp Source: Oral (01/26 0527) BP: 94/41 (01/26 0527) Pulse Rate: 73 (01/26 0527)  Labs: Recent Labs    12/16/20 0952 12/17/20 0425 12/17/20 2107 12/18/20 0604  HGB 12.2 9.5*  --   --   HCT 39.3 31.4*  --   --   PLT 181 147*  --   --   HEPARINUNFRC  --   --  <0.10* 0.25*  CREATININE 1.33* 1.67*  --   --     Estimated Creatinine Clearance: 32 mL/min (A) (by C-G formula based on SCr of 1.67 mg/dL (H)). 86.  Medical History: Past Medical History:  Diagnosis Date  . Arthritis   . Asthma   . CHF (congestive heart failure) ([place])   . COPD (chronic obstructive pulmonary disease) ([place])   . Depression   . Essential hypertension 02/08/2020  . GERD (gastroesophageal reflux disease)   . Hyperlipidemia   . Hyperparathyroidism   . Polycystic kidney    . PONV (postoperative nausea and vomiting)     Medications:  No anticoagulants PTA  Assessment: 82 y/oF with PMH of HTN, GERD, COPD, CHF, ESRD s/p renal transplant admitted for acute diverticulitis. Lower extremity venous duplex + for age indeterminate, non occlusive DVT involving the right popliteal vein. VQ scan pending. Pharmacy consulted to start IV heparin drip. Last dose of SQ heparin 5000 units given at 0617 this AM  12/18/2020 HL 0.25 sub-therapeutic on 1200 units/hr No bleeding noted Hgb 9.5 (1/25) Plts 147 (1/25)   Goal of Therapy:  Heparin level 0.3-0.7 units/ml Monitor platelets by anticoagulation protocol: Yes   Plan:   Bolus heparin 1000 units then increase drip to 1350 units/hr  Heparin level in 8 hours   Daily CBC, heparin level  F/u VQ scan   Monitor closely for s/sx of bleeding   Dolly Rias RPh 12/18/2020, 6:42 AM

## 2020-12-18 NOTE — Progress Notes (Signed)
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 Royce Macadamia, diastolic CHF, MGUS, HTN, debility and recent ED visit for loose uncomplicated sigmoid diverticulitis on 12/14/2020 returning with LLQ pain, vomiting, shortness of breath and fever.  She was a started on Cipro and Flagyl 2 days prior but was unable to take due to nausea and vomiting.  In ED, slightly tachycardic and tachypneic.  Significant labs include WBC 20.8, bicarb 19, AG 17, Cr 1.33 (1.16 two days prior), BUN 39 and total bili 3.1.  proBNP 01-11-2871.  Lactic acid negative.  CXR with vascular congestion and cardiomegaly.  COVID-19 PCR negative.  General surgery consulted.  CT abdomen and pelvis, LE [place] and TTE ordered.  CT abdomen and pelvis showed mild uncomplicated sigmoid diverticulitis, polycystic kidney or liver disease as before and unremarkable appearance of right pelvic transplanted kidney. LE [place] consistent with age-indeterminate nonocclusive DVT in the right popliteal vein not noted on previous ultrasound in 04/2020. TTE with LVEF of 70 to 75%, G2 DD, normal RVSP.   12/18/2020-patient was very tachypneic complaining of pleuritic chest pain worse with breathing, VQ scan low probability for pulmonary embolism had indeterminate DVT on heparin drip.  Her creatinine is also trending up.  Patient has history of renal transplant nephrology has been consulted.  Chest x-ray done shows fluid overload.  Not on any diuretics at this time.   Assessment & Plan:   Active Problems:   GERD without esophagitis   Renal transplant recipient   Depression   Sepsis ([place])   Sigmoid diverticulitis   Acute CHF (congestive heart failure) ([place])  #1 acute on chronic diastolic heart failure-chest x-ray done  today shows fluid overload.  Patient has been tachypneic tachycardic with increasing oxygen requirements.  She takes Lasix 80 mg daily at home which has not been restarted.  Will discuss with nephrology. Recent echo with ejection fraction 70 to 75% and grade 2 diastolic dysfunction.  #2 AKI on CKD stage IIIa in patient with history of ESRD due to polycystic kidney disease status post deceased donor transplant in 2012/01/12. Continue CellCept and Prograf. Creatinine 2.52 from 1.67 from 1.33 on admission  #3 severe sepsis secondary to sigmoid diverticulitis present on admission.  She met sepsis criteria on admission with tachypnea tachycardia hypotension leukocytosis and endorgan damage such as AKI.  She was initially on Zosyn changed to Unasyn in the setting of AKI.  General surgery following appreciate their input.  They are okay with clear liquids.  #4 anemia of chronic disease hemoglobin stable.  #5 right popliteal DVT age indeterminate nonocclusive.  VQ scan low probability for PE.  DC heparin drip.  #6 acute on chronic hypoxic respiratory failure secondary to multifactorial etiology including COPD/diastolic heart failure patient currently on 4 L to maintain her saturation above 92%.  She is on 2 L at home prior to admission.  #7 history of essential hypertension her blood pressure has been soft-she is on Imdur at home which we have been holding.    #8 thrombocytopenia chronic monitor closely.  #9 history of GERD on Protonix.  #10 worsening leukocytosis -on Unasyn  #11 hyponatremia stable  #12 hypoglycemia due to decreased p.o. intake resolved.  #13 SVT on verapamil.  This has been on hold since this morning due  to hypotension.  Pressure Injury 10/23/20 Sacrum Right;Left Stage 2 -  Partial thickness loss of dermis presenting as a shallow open injury with a red, pink wound bed without slough. (Active)  10/23/20 2058  Location: Sacrum  Location Orientation: Right;Left  Staging: Stage 2 -   Partial thickness loss of dermis presenting as a shallow open injury with a red, pink wound bed without slough.  Wound Description (Comments):   Present on Admission:     Estimated body mass index is 31.24 kg/m as calculated from the following:   Height as of this encounter: _0  (1.549 m).   Weight as of this encounter: 75 kg.  DVT prophylaxis: Heparin Code Status: Full code Family Communication discussed with daughter [name] over the phone  disposition Plan:  Status is: Inpatient  Dispo: The patient is from: Home              Anticipated d/c is to: Home              Anticipated d/c date is: > 3 days              Patient currently is not medically stable to d/c.  She is a kidney transplant patient with CHF COPD with worsening leukocytosis and creatinine.   Difficult to place patient not sure yet.    Consultants:   Nephrology, cardiology and general surgery  Procedures: None Antimicrobials Unasyn  Subjective: Patient is resting in bed appears tachypneic and tachycardic complaining of pleuritic chest pain and shortness of breath  Objective: Vitals:   12/17/20 2248 12/18/20 0032 12/18/20 0235 12/18/20 0527  BP: (!) 99/50  (!) 92/51 (!) 94/41  Pulse: 75  69 73  Resp: _1 Temp: (!) 97.4 F (36.3 C)  98.1 F (36.7 C) (!) 97.5 F (36.4 C)  TempSrc: Oral   Oral  SpO2: 100%  100% 100%  Weight:  75 kg    Height:  _2  (1.549 m)      Intake/Output Summary (Last 24 hours) at 12/18/2020 1056 Last data filed at 12/18/2020 0600 Gross per 24 hour  Intake -  Output 700 ml  Net -700 ml   Filed Weights   12/18/20 0032  Weight: 75 kg    Examination:  General exam: Appears calm and comfortable  Respiratory system: Diminished breath sounds at the bases  cardiovascular system: S1 & S2 heard, RRR. No JVD, murmurs, rubs, gallops or clicks. No pedal edema. Gastrointestinal system: Abdomen is nondistended, soft and nontender. No organomegaly or masses felt. Normal bowel  sounds heard. Central nervous system: Alert and oriented. No focal neurological deficits. Extremities: 1+ pitting edema  skin: No rashes, lesions or ulcers Psychiatry: Judgement and insight appear normal. Mood & affect appropriate.     Data Reviewed: I have personally reviewed following labs and imaging studies  CBC: Recent Labs  Lab 12/14/20 2242 12/16/20 0952 12/17/20 0425  WBC 9.6 20.8* 20.1*  NEUTROABS 7.6  --  17.2*  HGB 11.3* 12.2 9.5*  HCT 37.3 39.3 31.4*  MCV 103.3* 102.3* 103.3*  PLT 187 181 573*   Basic Metabolic Panel: Recent Labs  Lab 12/14/20 2242 12/16/20 0952 12/17/20 0425 12/18/20 0604  NA 136 134* 133* 130*  K 4.1 4.2 4.1 4.7  CL 98 98 100 96*  CO2 26 19* 21* 15*  GLUCOSE 77 68* 96 86  BUN 49* 39* 41* 51*  CREATININE 1.16* 1.33* 1.67* 2.52*  CALCIUM 9.7 9.6 8.4* 8.4*  MG  --   --   --  1.9  PHOS  --   --   --  6.1*   GFR: Estimated Creatinine Clearance: 21.2 mL/min (A) (by C-G formula based on SCr of 2.52 mg/dL (H)). Liver Function Tests: Recent Labs  Lab 12/14/20 2242 12/16/20 0952 12/17/20 0425 12/18/20 0604  AST _0 ALT _1 ALKPHOS 61 66 58 50  BILITOT 1.9* 3.1* 2.2* 1.3*  PROT 7.8 7.8 6.7 6.7  ALBUMIN 3.3* 3.3* 2.8* 2.8*   Recent Labs  Lab 12/16/20 0952  LIPASE 21   No results for input(s): AMMONIA in the last 168 hours. Coagulation Profile: Recent Labs  Lab 12/14/20 2242  INR 1.1   Cardiac Enzymes: No results for input(s): CKTOTAL, CKMB, CKMBINDEX, TROPONINI in the last 168 hours. BNP (last 3 results) No results for input(s): PROBNP in the last 8760 hours. HbA1C: Recent Labs    12/16/20 0902  HGBA1C 5.6   CBG: Recent Labs  Lab 12/16/20 2323 12/17/20 0038 12/17/20 0839 12/17/20 1146 12/18/20 0805  GLUCAP 54* 121* 84 88 72   Lipid Profile: No results for input(s): CHOL, HDL, LDLCALC, TRIG, CHOLHDL, LDLDIRECT in the last 72 hours. Thyroid Function Tests: No results for input(s): TSH,  T4TOTAL, FREET4, T3FREE, THYROIDAB in the last 72 hours. Anemia Panel: No results for input(s): VITAMINB12, FOLATE, FERRITIN, TIBC, IRON, RETICCTPCT in the last 72 hours. Sepsis Labs: Recent Labs  Lab 12/14/20 2242 12/16/20 1515 12/16/20 1758  LATICACIDVEN 0.9 0.8 0.7    Recent Results (from the past 240 hour(s))  Culture, blood (Routine x 2)     Status: None (Preliminary result)   Collection Time: 12/14/20 10:42 PM   Specimen: Right Antecubital; Blood  Result Value Ref Range Status   Specimen Description   Final    RIGHT ANTECUBITAL Performed at [facility] [address], [place], [place] [address]    Special Requests   Final    BOTTLES DRAWN AEROBIC AND ANAEROBIC Blood Culture adequate volume Performed at [facility] [address], [place], [place] [address]    Culture   Final    NO GROWTH 2 DAYS Performed at [facility] [address] [place], [place] [address]    Report Status PENDING  Incomplete  Culture, blood (Routine x 2)     Status: None (Preliminary result)   Collection Time: 12/14/20 10:47 PM   Specimen: BLOOD RIGHT FOREARM  Result Value Ref Range Status   Specimen Description   Final    BLOOD RIGHT FOREARM Performed at [facility] [address] [place], [place] [address]    Special Requests   Final    BOTTLES DRAWN AEROBIC AND ANAEROBIC Blood Culture results may not be optimal due to an inadequate volume of blood received in culture bottles Performed at [facility] [address], [place], [place] [address]    Culture   Final    NO GROWTH 2 DAYS Performed at [facility] [address], [place], [place] [address]    Report Status PENDING  Incomplete  SARS CORONAVIRUS 2 (TAT 6-24 HRS) Nasopharyngeal Nasopharyngeal Swab     Status: None   Collection Time: 12/14/20 11:36 PM   Specimen: Nasopharyngeal Swab  Result Value Ref Range Status   SARS Coronavirus  2 NEGATIVE NEGATIVE Final    Comment: (NOTE) SARS-CoV-2 target nucleic acids are NOT DETECTED.  The SARS-CoV-2 RNA is generally detectable in upper and lower respiratory specimens during  the acute phase of infection. Negative results do not preclude SARS-CoV-2 infection, do not rule out co-infections with other pathogens, and should not be used as the sole basis for treatment or other patient management decisions. Negative results must be combined with clinical observations, patient history, and epidemiological information. The expected result is Negative.  Fact Sheet for Patients: SugarRoll.be  Fact Sheet for Healthcare Providers: https://www.woods-mathews.com/  This test is not yet approved or cleared by the [place] FDA and  has been authorized for detection and/or diagnosis of SARS-CoV-2 by FDA under an Emergency Use Authorization (EUA). This EUA will remain  in effect (meaning this test can be used) for the duration of the COVID-19 declaration under Se ction 564(b)(1) of the Act, 21 U.S.C. section 360bbb-3(b)(1), unless the authorization is terminated or revoked sooner.  Performed at [facility], [place] [address], [place], [place] [address]          Radiology Studies: CT ABDOMEN PELVIS WO CONTRAST  Result Date: 12/16/2020 CLINICAL DATA:  Diverticulitis suspected. Abdominal pain in the left lower quadrant. EXAM: CT ABDOMEN AND PELVIS WITHOUT CONTRAST TECHNIQUE: Multidetector CT imaging of the abdomen and pelvis was performed following the standard protocol without IV contrast. COMPARISON:  CT dated December 14, 2020. FINDINGS: Lower chest: There are linear bands of scarring at the lung bases bilaterally. There is a trace right-sided pleural effusion.The heart size is mildly enlarged. Hepatobiliary: Innumerable cysts are noted throughout the patient's liver. Normal gallbladder.There is no biliary ductal dilation. Pancreas:  Normal contours without ductal dilatation. No peripancreatic fluid collection. Spleen: Unremarkable. Adrenals/Urinary Tract: --Adrenal glands: Unremarkable. --Right kidney/ureter: The right kidney is enlarged with multiple cysts. --Left kidney/ureter: The left kidney is enlarged with multiple cysts. 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 --Urinary bladder: Unremarkable. Stomach/Bowel: --Stomach/Duodenum: No hiatal hernia or other gastric abnormality. Normal duodenal course and caliber. --Small bowel: Unremarkable. --Colon: Again noted are findings of mild uncomplicated sigmoid diverticulitis (axial series 2, image 74). Innumerable diverticula are noted throughout the remaining portions of the colon. --Appendix: Normal. Vascular/Lymphatic: Atherosclerotic calcification is present within the non-aneurysmal abdominal aorta, without hemodynamically significant stenosis. --No retroperitoneal lymphadenopathy. --No mesenteric lymphadenopathy. --No pelvic or inguinal lymphadenopathy. Reproductive: Unremarkable Other: No ascites or free air. There is a fat containing umbilical hernia. Musculoskeletal. There is no acute fracture. There is an old healed fracture of the sacrum. The patient is status post prior total hip arthroplasty on the left. There is an old healed fracture of the left inferior pubic ramus. There is an old healed fracture of the superior pubic ramus on the left. There are old healed bilateral rib fractures. There are old bilateral sacral insufficiency fractures. IMPRESSION: 1. Persistent findings of mild uncomplicated sigmoid diverticulitis. 2. Polycystic kidney and liver disease as before. Unremarkable appearance of the right pelvic transplant kidney. 3. Fat containing umbilical hernia. 4. Trace right-sided pleural effusion. 5. Chronic osseous findings as detailed above. Aortic Atherosclerosis (ICD10-I70.0). Electronically Signed    By: Constance Holster M.D.   On: 12/16/2020 21:22   NM Pulmonary Perfusion  Result Date: 12/17/2020 CLINICAL DATA:  Positive D-dimer. EXAM: NUCLEAR MEDICINE PERFUSION LUNG SCAN TECHNIQUE: Perfusion images were obtained in multiple projections after intravenous injection of radiopharmaceutical. Ventilation scans intentionally deferred if perfusion scan and chest x-ray adequate for interpretation during COVID 19 epidemic. RADIOPHARMACEUTICALS:  4.0 mCi Tc-65mMAA IV COMPARISON:  Chest x-ray 12/16/2020. FINDINGS: Perfusion study only performed. No prominent segmental defect  noted to suggest pulmonary embolus. Low probability pulmonary embolus. IMPRESSION: Low probability pulmonary embolus. Electronically Signed   By: [place]   On: 12/17/2020 15:30   DG CHEST PORT 1 VIEW  Result Date: 12/18/2020 CLINICAL DATA:  Shortness of breath. EXAM: PORTABLE CHEST 1 VIEW COMPARISON:  Radiographs 12/16/2020 and 12/14/2020.  CT 05/14/2020. FINDINGS: 0918 hours. The heart size is stable at the upper limits of normal for portable technique. There is stable vascular congestion, bibasilar atelectasis and trace bilateral pleural effusions. No changes are seen compared with the recent prior studies. The bones appear unremarkable. Telemetry leads overlie the chest. IMPRESSION: Stable chest. Vascular congestion, bibasilar atelectasis and trace bilateral pleural effusions appear unchanged. Electronically Signed   By: Richardean Sale M.D.   On: 12/18/2020 09:29   DG Chest Port 1 View  Result Date: 12/16/2020 CLINICAL DATA:  Weakness, pleural effusions, LEFT lower quadrant abdominal pain, nausea, dry heaving, pleural effusions by prior radiograph EXAM: PORTABLE CHEST 1 VIEW COMPARISON:  Portable exam 1205 hours compared to 12/14/2020 FINDINGS: Borderline enlargement of cardiac silhouette with slight vascular congestion. Mediastinal contours normal. Bibasilar atelectasis and tiny pleural effusions. No acute infiltrate/edema  or pneumothorax. Bones demineralized. IMPRESSION: Enlargement of cardiac silhouette with pulmonary vascular congestion. Bibasilar atelectasis and tiny pleural effusions. Electronically Signed   By: Lavonia Dana M.D.   On: 12/16/2020 12:27   ECHOCARDIOGRAM COMPLETE  Result Date: 12/17/2020    ECHOCARDIOGRAM REPORT   Patient Name:   April [name] Arvidson Date of Exam: 12/17/2020 Medical Rec #:  [id number]         Height:       61.0 in Accession #:    [id number]        Weight:       167.0 lb Date of Birth:  07-12-1956         BSA:          1.749 m Patient Age:    10 years          BP:           105/56 mmHg Patient Gender: F                 HR:           98 bpm. Exam Location:  Inpatient Procedure: 2D Echo, Color Doppler and Cardiac Doppler Indications:    CHF-Acute Diastolic E01.00  History:        Patient has prior history of Echocardiogram examinations, most                 recent 04/17/2020. CHF, COPD; Risk Factors:Hypertension,                 Dyslipidemia and Diabetes.  Sonographer:    Bernadene Person RDCS Referring Phys: [id number] [place]  1. Left ventricular ejection fraction, by estimation, is 70 to 75%. The left ventricle has hyperdynamic function. The left ventricle has no regional wall motion abnormalities. Left ventricular diastolic parameters are consistent with Grade II diastolic dysfunction (pseudonormalization). Elevated left atrial pressure.  2. Right ventricular systolic function is normal. The right ventricular size is normal. There is normal pulmonary artery systolic pressure.  3. The mitral valve is normal in structure. No evidence of mitral valve regurgitation. No evidence of mitral stenosis.  4. The aortic valve is tricuspid. Aortic valve regurgitation is not visualized. No aortic stenosis is present.  5. The inferior vena cava is normal in size with greater than 50% respiratory variability, suggesting  right atrial pressure of 3 mmHg. FINDINGS  Left Ventricle: Left ventricular  ejection fraction, by estimation, is 70 to 75%. The left ventricle has hyperdynamic function. The left ventricle has no regional wall motion abnormalities. The left ventricular internal cavity size was normal in size. There is no left ventricular hypertrophy. Left ventricular diastolic parameters are consistent with Grade II diastolic dysfunction (pseudonormalization). Elevated left atrial pressure. Right Ventricle: The right ventricular size is normal. Right ventricular systolic function is normal. There is normal pulmonary artery systolic pressure. The tricuspid regurgitant velocity is 2.51 m/s, and with an assumed right atrial pressure of 3 mmHg,  the estimated right ventricular systolic pressure is 65.7 mmHg. Left Atrium: Left atrial size was normal in size. Right Atrium: Right atrial size was normal in size. Pericardium: There is no evidence of pericardial effusion. Mitral Valve: The mitral valve is normal in structure. Mild mitral annular calcification. No evidence of mitral valve regurgitation. No evidence of mitral valve stenosis. Tricuspid Valve: The tricuspid valve is normal in structure. Tricuspid valve regurgitation is trivial. No evidence of tricuspid stenosis. Aortic Valve: The aortic valve is tricuspid. Aortic valve regurgitation is not visualized. No aortic stenosis is present. Pulmonic Valve: The pulmonic valve was normal in structure. Pulmonic valve regurgitation is not visualized. No evidence of pulmonic stenosis. Aorta: The aortic root is normal in size and structure. Venous: The inferior vena cava is normal in size with greater than 50% respiratory variability, suggesting right atrial pressure of 3 mmHg. IAS/Shunts: No atrial level shunt detected by color flow Doppler.  LEFT VENTRICLE PLAX 2D LVIDd:         4.50 cm  Diastology LVIDs:         2.60 cm  LV e' medial:    4.68 cm/s LV PW:         0.90 cm  LV E/e' medial:  20.9 LV IVS:        0.90 cm  LV e' lateral:   9.68 cm/s LVOT diam:     2.00 cm   LV E/e' lateral: 10.1 LV SV:         79 LV SV Index:   45 LVOT Area:     3.14 cm  RIGHT VENTRICLE RV S prime:     13.70 cm/s TAPSE (M-mode): 1.5 cm LEFT ATRIUM           Index       RIGHT ATRIUM           Index LA diam:      4.50 cm 2.57 cm/m  RA Area:     12.10 cm LA Vol (A2C): 46.3 ml 26.47 ml/m RA Volume:   23.40 ml  13.38 ml/m LA Vol (A4C): 51.8 ml 29.61 ml/m  AORTIC VALVE LVOT Vmax:   187.00 cm/s LVOT Vmean:  134.000 cm/s LVOT VTI:    0.253 m  AORTA Ao Root diam: 3.40 cm Ao Asc diam:  3.40 cm MITRAL VALVE               TRICUSPID VALVE MV Area (PHT): 2.60 cm    TR Peak grad:   25.2 mmHg MV Decel Time: 292 msec    TR Vmax:        251.00 cm/s MV E velocity: 97.90 cm/s MV A velocity: 92.40 cm/s  SHUNTS MV E/A ratio:  1.06        Systemic VTI:  0.25 m  Systemic Diam: 2.00 cm Kirk Ruths MD Electronically signed by Kirk Ruths MD Signature Date/Time: 12/17/2020/11:23:38 AM    Final    VAS [place] LOWER EXTREMITY VENOUS (DVT)  Result Date: 12/17/2020  Lower Venous DVT Study Indications: Swelling L>R.  Risk Factors: Limited mobility due to O2 dependence. Comparison Study: Previous exam - 01/14/20 (negative) Performing Technologist: Rogelia Rohrer  Examination Guidelines: A complete evaluation includes B-mode imaging, spectral Doppler, color Doppler, and power Doppler as needed of all accessible portions of each vessel. Bilateral testing is considered an integral part of a complete examination. Limited examinations for reoccurring indications may be performed as noted. The reflux portion of the exam is performed with the patient in reverse Trendelenburg.  +---------+---------------+---------+-----------+----------+-------------------+ RIGHT    CompressibilityPhasicitySpontaneityPropertiesThrombus Aging      +---------+---------------+---------+-----------+----------+-------------------+ CFV      Full           Yes      Yes                                       +---------+---------------+---------+-----------+----------+-------------------+ SFJ      Full                                                             +---------+---------------+---------+-----------+----------+-------------------+ FV Prox  Full           Yes      Yes                                      +---------+---------------+---------+-----------+----------+-------------------+ FV Mid   Full           Yes      Yes                                      +---------+---------------+---------+-----------+----------+-------------------+ FV DistalFull           Yes      Yes                                      +---------+---------------+---------+-----------+----------+-------------------+ PFV      Full                                                             +---------+---------------+---------+-----------+----------+-------------------+ POP      Partial        Yes      Yes                  Age Indeterminate   +---------+---------------+---------+-----------+----------+-------------------+ PTV      Full  Not well visualized +---------+---------------+---------+-----------+----------+-------------------+ PERO     Full                                                             +---------+---------------+---------+-----------+----------+-------------------+   +---------+---------------+---------+-----------+----------+-------------------+ LEFT     CompressibilityPhasicitySpontaneityPropertiesThrombus Aging      +---------+---------------+---------+-----------+----------+-------------------+ CFV      Full           Yes      Yes                                      +---------+---------------+---------+-----------+----------+-------------------+ SFJ      Full                                                             +---------+---------------+---------+-----------+----------+-------------------+ FV  Prox  Full           Yes      Yes                                      +---------+---------------+---------+-----------+----------+-------------------+ FV Mid   Full           Yes      Yes                                      +---------+---------------+---------+-----------+----------+-------------------+ FV DistalFull           Yes      Yes                                      +---------+---------------+---------+-----------+----------+-------------------+ PFV      Full                                                             +---------+---------------+---------+-----------+----------+-------------------+ POP      Full           Yes      Yes                                      +---------+---------------+---------+-----------+----------+-------------------+ PTV      Full                                         Not well visualized +---------+---------------+---------+-----------+----------+-------------------+ PERO     Full                                                             +---------+---------------+---------+-----------+----------+-------------------+  Summary: BILATERAL: -No evidence of popliteal cyst, bilaterally. RIGHT: - Findings consistent with age indeterminate, non occlusive deep vein thrombosis involving the right popliteal vein. Subcutaneous edema  LEFT: - There is no evidence of deep vein thrombosis in the lower extremity.  Subcutaneous edema.  *See table(s) above for measurements and observations. Electronically signed by Deitra Mayo MD on 12/17/2020 at 7:47:55 AM.    Final         Scheduled Meds: . allopurinol  100 mg Oral Daily  . arformoterol  15 mcg Nebulization BID   And  . umeclidinium bromide  1 puff Inhalation Daily   And  . budesonide (PULMICORT) nebulizer solution  0.5 mg Nebulization BID  . brimonidine  1 drop Both Eyes BID  . cycloSPORINE  1 drop Both Eyes BID  . dorzolamide  1 drop Both Eyes Daily  .  ferrous sulfate  325 mg Oral Q breakfast  . FLUoxetine  40 mg Oral Daily  . gabapentin  200 mg Oral BID  . guaiFENesin  600 mg Oral BID  . latanoprost  1 drop Both Eyes QHS  . magnesium oxide  400 mg Oral BID  . mycophenolate  180 mg Oral BID  . pantoprazole  40 mg Oral BID  . predniSONE  7.5 mg Oral Q breakfast  . tacrolimus  1 mg Oral BID  . valACYclovir  500 mg Oral BID  . cyanocobalamin  1,000 mcg Oral Daily   Continuous Infusions: . ampicillin-sulbactam (UNASYN) IV 3 g (12/18/20 1024)  . heparin 1,350 Units/hr (12/18/20 0659)     LOS: 2 days     Georgette Shell, MD 12/18/2020, 10:56 AM

## 2020-12-19 DIAGNOSIS — K5792 Diverticulitis of intestine, part unspecified, without perforation or abscess without bleeding: Secondary | ICD-10-CM | POA: Diagnosis not present

## 2020-12-19 DIAGNOSIS — E2749 Other adrenocortical insufficiency: Secondary | ICD-10-CM | POA: Diagnosis not present

## 2020-12-19 DIAGNOSIS — R1114 Bilious vomiting: Secondary | ICD-10-CM | POA: Diagnosis not present

## 2020-12-19 DIAGNOSIS — I7300 Raynaud's syndrome without gangrene: Secondary | ICD-10-CM | POA: Diagnosis not present

## 2020-12-19 DIAGNOSIS — N179 Acute kidney failure, unspecified: Secondary | ICD-10-CM | POA: Diagnosis not present

## 2020-12-19 DIAGNOSIS — I471 Supraventricular tachycardia: Secondary | ICD-10-CM | POA: Diagnosis not present

## 2020-12-19 LAB — COMPREHENSIVE METABOLIC PANEL WITH GFR
ALT: 10 U/L (ref 0–44)
AST: 13 U/L — ABNORMAL LOW (ref 15–41)
Albumin: 2.6 g/dL — ABNORMAL LOW (ref 3.5–5.0)
Alkaline Phosphatase: 50 U/L (ref 38–126)
Anion gap: 14 (ref 5–15)
BUN: 57 mg/dL — ABNORMAL HIGH (ref 8–23)
CO2: 18 mmol/L — ABNORMAL LOW (ref 22–32)
Calcium: 8.1 mg/dL — ABNORMAL LOW (ref 8.9–10.3)
Chloride: 95 mmol/L — ABNORMAL LOW (ref 98–111)
Creatinine, Ser: 2.25 mg/dL — ABNORMAL HIGH (ref 0.44–1.00)
GFR, Estimated: 24 mL/min — ABNORMAL LOW
Glucose, Bld: 97 mg/dL (ref 70–99)
Potassium: 4.2 mmol/L (ref 3.5–5.1)
Sodium: 127 mmol/L — ABNORMAL LOW (ref 135–145)
Total Bilirubin: 1.1 mg/dL (ref 0.3–1.2)
Total Protein: 6.8 g/dL (ref 6.5–8.1)

## 2020-12-19 MED ORDER — METHYLPREDNISOLONE SODIUM SUCC 125 MG IJ SOLR
60.0000 mg | Freq: Four times a day (QID) | INTRAMUSCULAR | Status: DC
  Administered 2020-12-19 – 2020-12-23 (×17): 60 mg via INTRAVENOUS
  Filled 2020-12-19 (×18): qty 2

## 2020-12-19 MED ORDER — ALPRAZOLAM 0.5 MG PO TABS
0.5000 mg | ORAL_TABLET | Freq: Two times a day (BID) | ORAL | Status: DC | PRN
  Administered 2020-12-19 – 2020-12-22 (×2): 0.5 mg via ORAL
  Filled 2020-12-19 (×2): qty 1

## 2020-12-19 MED ORDER — ISOSORBIDE MONONITRATE ER 60 MG PO TB24
30.0000 mg | ORAL_TABLET | Freq: Every day | ORAL | Status: DC
  Administered 2020-12-19 – 2020-12-28 (×10): 30 mg via ORAL
  Filled 2020-12-19 (×11): qty 1

## 2020-12-19 MED ORDER — VERAPAMIL HCL ER 120 MG PO TBCR
120.0000 mg | EXTENDED_RELEASE_TABLET | Freq: Every day | ORAL | Status: DC
Start: 2020-12-19 — End: 2020-12-22
  Administered 2020-12-19 – 2020-12-22 (×4): 120 mg via ORAL
  Filled 2020-12-19 (×4): qty 1

## 2020-12-19 NOTE — Progress Notes (Addendum)
Progress Note  Patient Name: April [name] Date of Encounter: 12/19/2020  [facility] Cardiologist: No primary care provider on file. ? New Dr. Sallyanne Kuster   Subjective   Feels tight in her chest and SOB - lungs tight on exam  Inpatient Medications    Scheduled Meds: . allopurinol  100 mg Oral Daily  . arformoterol  15 mcg Nebulization BID   And  . umeclidinium bromide  1 puff Inhalation Daily   And  . budesonide (PULMICORT) nebulizer solution  0.5 mg Nebulization BID  . brimonidine  1 drop Both Eyes BID  . cycloSPORINE  1 drop Both Eyes BID  . dorzolamide  1 drop Both Eyes Daily  . ferrous sulfate  325 mg Oral Q breakfast  . FLUoxetine  40 mg Oral Daily  . gabapentin  200 mg Oral BID  . guaiFENesin  600 mg Oral BID  . heparin injection (subcutaneous)  5,000 Units Subcutaneous Q8H  . latanoprost  1 drop Both Eyes QHS  . magnesium oxide  400 mg Oral BID  . mycophenolate  180 mg Oral BID  . pantoprazole  40 mg Oral BID  . predniSONE  7.5 mg Oral Q breakfast  . tacrolimus  1 mg Oral BID  . valACYclovir  500 mg Oral BID  . cyanocobalamin  1,000 mcg Oral Daily   Continuous Infusions: . ampicillin-sulbactam (UNASYN) IV 3 g (12/19/20 1019)   PRN Meds: acetaminophen **OR** acetaminophen, bisacodyl, ipratropium-albuterol, ondansetron **OR** ondansetron (ZOFRAN) IV   Vital Signs    Vitals:   12/18/20 2206 12/19/20 0537 12/19/20 0729 12/19/20 0837  BP:  119/71    Pulse:  92 98   Resp:  18 (!) 24   Temp:  97.6 F (36.4 C)    TempSrc:  Oral    SpO2: 98% 100%  100%  Weight:      Height:        Intake/Output Summary (Last 24 hours) at 12/19/2020 1107 Last data filed at 12/19/2020 1104 Gross per 24 hour  Intake 740 ml  Output 300 ml  Net 440 ml   Last 3 Weights 12/18/2020 12/14/2020 11/13/2020  Weight (lbs) 165 lb 5.5 oz 167 lb 167 lb  Weight (kg) 75 kg 75.751 kg 75.751 kg      Telemetry    SR - Personally Reviewed  ECG    No new - Personally  Reviewed  Physical Exam   GEN: No acute distress.   Neck: No JVD Cardiac: RRR, no murmurs, rubs, or gallops.  Respiratory: tight , decreased breath sounds to auscultation bilaterally. GI: Soft, nontender, non-distended  MS: No edema; No deformity. Neuro:  Nonfocal  Psych: Normal affect   Labs    High Sensitivity Troponin:   Recent Labs  Lab 12/18/20 1042 12/18/20 1357  TROPONINIHS 11 14      Chemistry Recent Labs  Lab 12/17/20 0425 12/18/20 0604 12/19/20 0552  NA 133* 130* 127*  K 4.1 4.7 4.2  CL 100 96* 95*  CO2 21* 15* 18*  GLUCOSE 96 86 97  BUN 41* 51* 57*  CREATININE 1.67* 2.52* 2.25*  CALCIUM 8.4* 8.4* 8.1*  PROT 6.7 6.7 6.8  ALBUMIN 2.8* 2.8* 2.6*  AST 17 15 13*  ALT 12 11 10   ALKPHOS 58 50 50  BILITOT 2.2* 1.3* 1.1  GFRNONAA 34* 21* 24*  ANIONGAP 12 19* 14     Hematology Recent Labs  Lab 12/14/20 2242 12/16/20 0952 12/17/20 0425  WBC 9.6 20.8* 20.1*  RBC 3.61* 3.84* 3.04*  HGB 11.3* 12.2 9.5*  HCT 37.3 39.3 31.4*  MCV 103.3* 102.3* 103.3*  MCH 31.3 31.8 31.3  MCHC 30.3 31.0 30.3  RDW 19.1* 19.5* 19.3*  PLT 187 181 147*    BNP Recent Labs  Lab 12/16/20 0952 12/18/20 1042  BNP 2,872.2* 567.0*     DDimer No results for input(s): DDIMER in the last 168 hours.   Radiology    NM Pulmonary Perfusion  Result Date: 12/17/2020 CLINICAL DATA:  Positive D-dimer. EXAM: NUCLEAR MEDICINE PERFUSION LUNG SCAN TECHNIQUE: Perfusion images were obtained in multiple projections after intravenous injection of radiopharmaceutical. Ventilation scans intentionally deferred if perfusion scan and chest x-ray adequate for interpretation during COVID 19 epidemic. RADIOPHARMACEUTICALS:  4.0 mCi Tc-70m MAA IV COMPARISON:  Chest x-ray 12/16/2020. FINDINGS: Perfusion study only performed. No prominent segmental defect noted to suggest pulmonary embolus. Low probability pulmonary embolus. IMPRESSION: Low probability pulmonary embolus. Electronically Signed   By:  [place]   On: 12/17/2020 15:30   DG CHEST PORT 1 VIEW  Result Date: 12/18/2020 CLINICAL DATA:  Shortness of breath. EXAM: PORTABLE CHEST 1 VIEW COMPARISON:  Radiographs 12/16/2020 and 12/14/2020.  CT 05/14/2020. FINDINGS: 0918 hours. The heart size is stable at the upper limits of normal for portable technique. There is stable vascular congestion, bibasilar atelectasis and trace bilateral pleural effusions. No changes are seen compared with the recent prior studies. The bones appear unremarkable. Telemetry leads overlie the chest. IMPRESSION: Stable chest. Vascular congestion, bibasilar atelectasis and trace bilateral pleural effusions appear unchanged. Electronically Signed   By: Richardean Sale M.D.   On: 12/18/2020 09:29    Cardiac Studies    ECHO 12/17/20   IMPRESSIONS     1. Left ventricular ejection fraction, by estimation, is 70 to 75%. The  left ventricle has hyperdynamic function. The left ventricle has no  regional wall motion abnormalities. Left ventricular diastolic parameters  are consistent with Grade II diastolic  dysfunction (pseudonormalization). Elevated left atrial pressure.   2. Right ventricular systolic function is normal. The right ventricular  size is normal. There is normal pulmonary artery systolic pressure.   3. The mitral valve is normal in structure. No evidence of mitral valve  regurgitation. No evidence of mitral stenosis.   4. The aortic valve is tricuspid. Aortic valve regurgitation is not  visualized. No aortic stenosis is present.   5. The inferior vena cava is normal in size with greater than 50%  respiratory variability, suggesting right atrial pressure of 3 mmHg.   FINDINGS   Left Ventricle: Left ventricular ejection fraction, by estimation, is 70  to 75%. The left ventricle has hyperdynamic function. The left ventricle  has no regional wall motion abnormalities. The left ventricular internal  cavity size was normal in size.  There is no  left ventricular hypertrophy. Left ventricular diastolic  parameters are consistent with Grade II diastolic dysfunction  (pseudonormalization). Elevated left atrial pressure.   Right Ventricle: The right ventricular size is normal. Right ventricular  systolic function is normal. There is normal pulmonary artery systolic  pressure. The tricuspid regurgitant velocity is 2.51 m/s, and with an  assumed right atrial pressure of 3 mmHg,   the estimated right ventricular systolic pressure is 19.6 mmHg.   Left Atrium: Left atrial size was normal in size.   Right Atrium: Right atrial size was normal in size.   Pericardium: There is no evidence of pericardial effusion.   Mitral Valve: The mitral valve is normal in  structure. Mild mitral annular  calcification. No evidence of mitral valve regurgitation. No evidence of  mitral valve stenosis.   Tricuspid Valve: The tricuspid valve is normal in structure. Tricuspid  valve regurgitation is trivial. No evidence of tricuspid stenosis.   Aortic Valve: The aortic valve is tricuspid. Aortic valve regurgitation is  not visualized. No aortic stenosis is present.   Pulmonic Valve: The pulmonic valve was normal in structure. Pulmonic valve  regurgitation is not visualized. No evidence of pulmonic stenosis.   Aorta: The aortic root is normal in size and structure.   Venous: The inferior vena cava is normal in size with greater than 50%  respiratory variability, suggesting right atrial pressure of 3 mmHg.   IAS/Shunts: No atrial level shunt detected by color flow Doppler.    Patient Profile     65 y.o. female with a hx of HTN, GERD, COPD, MGUS, HFpEF, SVT 01/2020, ESRD with renal transplant 2013 on immunosuppressants followed at [facility] and admitted 1.24.22 with diverticulitis after failing outpt therapy.  Seen for hypotension.    Assessment & Plan    1. Hypotension may be with dehydration, Cr is now elevated.  She did have elevated BNP on arrival but  also with N&V.  She rec'd 1 dose of lasix IV but no I&O.  She has hx of diastolic HF and has hyperdynamic EF on echo this admit.  Her CXR with HF as well.   neg troponins.  Now with SOB.  BNP yesterday 567  Down from 2872   BP improved.  Now 710 to 626 systolic.   (Imdur for Raynaud's and verapamil for SVT on hold) 2. Acute colitis per IM on ABX hx of diverticulitis.  improved 3. AKI with hx renal transplant. On cell cept and prograf.  Had been off meds for 2-3 days with N&V.   Renal saw yesterday and IV fluids given. Cr still elevated 2.25  Na to 127 as well.  4. 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 Dates back to 01/2020 with acute lung asthma exacerbation.  None this admit, hold verapamil with hypotension resume prior to discharge  HR 105 to 116. 5. Chronic COPD /asthma- I only see asthma from chart + SOB has used nebs       For questions or updates, please contact [place] Please consult [email or web address] for contact info under        Signed, Cecilie Kicks, NP  12/19/2020, 11:07 AM    I have seen and examined the patient along with Cecilie Kicks, NP .  I have reviewed the chart, notes and new data.  I agree with PA/NP's note.  Key new complaints: feels tight in chest, like w asthma exacerbation Key examination changes: no clinical signs of hypervolemia Key new findings / data: improving renal function, paradoxically BNP also better.  PLAN: OK to resume the isosorbide and verapamil (will restart each at a lower dose since both Raynaud's and SVT have been well controlled), but she may be best served by referral to EP for RFA of slow pathway for AVNRT, to avoid issues   [facility] will sign off.   Medication Recommendations:  Verapamil SR 120 mg daily, isosorbide mononitrate 30 mg daily Other recommendations (labs, testing, etc):  n/a Follow up as an outpatient:  2-3 months with me   Sanda Klein, MD, [place] [phone] 12/19/2020, 1:03 PM

## 2020-12-19 NOTE — Progress Notes (Signed)
PROGRESS NOTE    April [name]  ONG:[id number] DOB: 1956/10/08 DOA: 12/16/2020 PCP: April Caraway, MD    Brief Narrative:65 year old female with history of COPD/chronic RF on 2 L, ESRD s/p deceased donor renal transplant in 05/2012 on immunosuppressants followed up at [facility], [place] followed by Dr. Royce [name], diastolic CHF, MGUS, HTN, debility and recent ED visit for loose uncomplicated sigmoid diverticulitis on 12/14/2020 returning with LLQ pain, vomiting, shortness of breath and fever.  She was a started on Cipro and Flagyl 2 days prior but was unable to take due to nausea and vomiting.  In ED, slightly tachycardic and tachypneic.  Significant labs include WBC 20.8, bicarb 19, AG 17, Cr 1.33 (1.16 two days prior), BUN 39 and total bili 3.1.  proBNP January 17, 2871.  Lactic acid negative.  CXR with vascular congestion and cardiomegaly.  COVID-19 PCR negative.  General surgery consulted.  CT abdomen and pelvis, [name] [place] and TTE ordered.  CT abdomen and pelvis showed mild uncomplicated sigmoid diverticulitis, polycystic kidney or liver disease as before and unremarkable appearance of right pelvic transplanted kidney. [name] [place] consistent with age-indeterminate nonocclusive DVT in the right popliteal vein not noted on previous ultrasound in 04/2020. TTE with LVEF of 70 to 75%, G2 DD, normal RVSP.   12/18/2020-patient was very tachypneic complaining of pleuritic chest pain worse with breathing, VQ scan low probability for pulmonary embolism had indeterminate DVT on heparin drip.  Her creatinine is also trending up.  Patient has history of renal transplant nephrology has been consulted.  Chest x-ray done shows fluid overload.  Not on any diuretics at this time. 1/27-she continues to c/o sob asking for solumedrol,wheezing sitting up in commode  Had some loose BM   Assessment & Plan:   Active Problems:   GERD without esophagitis   Renal transplant recipient   Depression   Sepsis ([place])   SOB (shortness of breath)    Acute diverticulitis   Sigmoid diverticulitis   Acute CHF (congestive heart failure) (HCC)   Bilious vomiting with nausea  #1 acute on chronic diastolic heart failure-  Patient has been tachypneic tachycardic with increasing oxygen requirements.  She takes Lasix 80 mg daily at home which has not been restarted due to AKI. Recent echo with ejection fraction 70 to 75% and grade 2 diastolic dysfunction.  #2 AKI on CKD stage IIIa in patient with history of ESRD due to polycystic kidney disease status post deceased donor transplant in 18-Jan-2012.due to hypotension/ Continue CellCept and Prograf. Creatinine to 2.25 from (after 500 cc NS) 2.52 from 1.67 from 1.33 on admission  #3 severe sepsis secondary to sigmoid diverticulitis present on admission.  She met sepsis criteria on admission with tachypnea tachycardia hypotension leukocytosis and endorgan damage such as AKI.  She was initially on Zosyn changed to Unasyn in the setting of AKI.  General surgery following appreciate their input. TOLERATING CLEAR LIQUIDS.  #4 anemia of chronic disease hemoglobin stable.  #5 right popliteal DVT age indeterminate nonocclusive.  VQ scan low probability for PE.  DC heparin drip 1/26  #6 acute on chronic hypoxic respiratory failure secondary to multifactorial etiology including COPD/diastolic heart failure patient currently on 4 L to maintain her saturation above 92%.  She is on 2 L at home prior to admission.  #7 history of essential hypertension her blood pressure has been soft-she is on Imdur at home which we have been holding.    #8 thrombocytopenia chronic monitor closely.  #9 history of GERD on Protonix.  #10 worsening  leukocytosis -on Unasyn  #11 hyponatremia NA dropped to 127 from 130  #12 hypoglycemia due to decreased p.o. intake resolved.  #13 SVT on verapamil.  This has been on hold since this morning due to hypotension.  Pressure Injury 10/23/20 Sacrum Right;Left Stage 2 -  Partial thickness loss  of dermis presenting as a shallow open injury with a red, pink wound bed without slough. (Active)  10/23/20 2058  Location: Sacrum  Location Orientation: Right;Left  Staging: Stage 2 -  Partial thickness loss of dermis presenting as a shallow open injury with a red, pink wound bed without slough.  Wound Description (Comments):   Present on Admission:     Estimated body mass index is 31.24 kg/m as calculated from the following:   Height as of this encounter: 5' 1" (1.549 m).   Weight as of this encounter: 75 kg.  DVT prophylaxis: Heparin Code Status: Full code Family Communication discussed with daughter April [name] over the phone  disposition Plan:  Status is: Inpatient  Dispo: The patient is from: Home              Anticipated d/c is to: Home              Anticipated d/c date is: > 3 days              Patient currently is not medically stable to d/c.  She is a kidney transplant patient with CHF COPD with worsening leukocytosis and creatinine.   Difficult to place patient not sure yet.    Consultants:   Nephrology, cardiology and general surgery  Procedures: None Antimicrobials Unasyn  Subjective: Patient is sitting up in commode c/i sob doe and wheezing   Objective: Vitals:   12/18/20 2206 12/19/20 0537 12/19/20 0729 12/19/20 0837  BP:  119/71    Pulse:  92 98   Resp:  18 (!) 24   Temp:  97.6 F (36.4 C)    TempSrc:  Oral    SpO2: 98% 100%  100%  Weight:      Height:        Intake/Output Summary (Last 24 hours) at 12/19/2020 1039 Last data filed at 12/19/2020 0810 Gross per 24 hour  Intake 500 ml  Output 300 ml  Net 200 ml   Filed Weights   12/18/20 0032  Weight: 75 kg    Examination:  General exam: Appears calm and comfortable  Respiratory system: Diminished breath sounds at the bases  cardiovascular system: S1 & S2 heard, RRR. No JVD, murmurs, rubs, gallops or clicks. No pedal edema. Gastrointestinal system: Abdomen is nondistended, soft and nontender.  No organomegaly or masses felt. Normal bowel sounds heard. Central nervous system: Alert and oriented. No focal neurological deficits. Extremities: 1+ pitting edema  skin: No rashes, lesions or ulcers Psychiatry: Judgement and insight appear normal. Mood & affect appropriate.     Data Reviewed: I have personally reviewed following labs and imaging studies  CBC: Recent Labs  Lab 12/14/20 2242 12/16/20 0952 12/17/20 0425  WBC 9.6 20.8* 20.1*  NEUTROABS 7.6  --  17.2*  HGB 11.3* 12.2 9.5*  HCT 37.3 39.3 31.4*  MCV 103.3* 102.3* 103.3*  PLT 187 181 767*   Basic Metabolic Panel: Recent Labs  Lab 12/14/20 2242 12/16/20 0952 12/17/20 0425 12/18/20 0604 12/19/20 0552  NA 136 134* 133* 130* 127*  K 4.1 4.2 4.1 4.7 4.2  CL 98 98 100 96* 95*  CO2 26 19* 21* 15* 18*  GLUCOSE 77 68*  96 86 97  BUN 49* 39* 41* 51* 57*  CREATININE 1.16* 1.33* 1.67* 2.52* 2.25*  CALCIUM 9.7 9.6 8.4* 8.4* 8.1*  MG  --   --   --  1.9  --   PHOS  --   --   --  6.1*  --    GFR: Estimated Creatinine Clearance: 23.7 mL/min (A) (by C-G formula based on SCr of 2.25 mg/dL (H)). Liver Function Tests: Recent Labs  Lab 12/14/20 2242 12/16/20 0952 12/17/20 0425 12/18/20 0604 12/19/20 0552  AST _0 13*  ALT _1 ALKPHOS 61 66 58 50 50  BILITOT 1.9* 3.1* 2.2* 1.3* 1.1  PROT 7.8 7.8 6.7 6.7 6.8  ALBUMIN 3.3* 3.3* 2.8* 2.8* 2.6*   Recent Labs  Lab 12/16/20 0952  LIPASE 21   No results for input(s): AMMONIA in the last 168 hours. Coagulation Profile: Recent Labs  Lab 12/14/20 2242  INR 1.1   Cardiac Enzymes: No results for input(s): CKTOTAL, CKMB, CKMBINDEX, TROPONINI in the last 168 hours. BNP (last 3 results) No results for input(s): PROBNP in the last 8760 hours. HbA1C: No results for input(s): HGBA1C in the last 72 hours. CBG: Recent Labs  Lab 12/17/20 0839 12/17/20 1146 12/18/20 0805 12/18/20 1144 12/18/20 1631  GLUCAP 84 88 72 140* 110*   Lipid Profile: No  results for input(s): CHOL, HDL, LDLCALC, TRIG, CHOLHDL, LDLDIRECT in the last 72 hours. Thyroid Function Tests: No results for input(s): TSH, T4TOTAL, FREET4, T3FREE, THYROIDAB in the last 72 hours. Anemia Panel: No results for input(s): VITAMINB12, FOLATE, FERRITIN, TIBC, IRON, RETICCTPCT in the last 72 hours. Sepsis Labs: Recent Labs  Lab 12/14/20 2242 12/16/20 1515 12/16/20 1758  LATICACIDVEN 0.9 0.8 0.7    Recent Results (from the past 240 hour(s))  Culture, blood (Routine x 2)     Status: None (Preliminary result)   Collection Time: 12/14/20 10:42 PM   Specimen: Right Antecubital; Blood  Result Value Ref Range Status   Specimen Description   Final    RIGHT ANTECUBITAL Performed at [facility] [address], [place], [place] [address]    Special Requests   Final    BOTTLES DRAWN AEROBIC AND ANAEROBIC Blood Culture adequate volume Performed at [facility] [address], [place], [place] [address]    Culture   Final    NO GROWTH 3 DAYS Performed at [facility], [place] [address] [place], [place] [address]    Report Status PENDING  Incomplete  Culture, blood (Routine x 2)     Status: None (Preliminary result)   Collection Time: 12/14/20 10:47 PM   Specimen: BLOOD RIGHT FOREARM  Result Value Ref Range Status   Specimen Description   Final    BLOOD RIGHT FOREARM Performed at [facility] [address], [place], [place] [address]    Special Requests   Final    BOTTLES DRAWN AEROBIC AND ANAEROBIC Blood Culture results may not be optimal due to an inadequate volume of blood received in culture bottles Performed at [facility] [place] [address], [place], [place] [address]    Culture   Final    NO GROWTH 3 DAYS Performed at [facility], [place] [address] [place], [place] [address]    Report Status PENDING  Incomplete  SARS CORONAVIRUS 2 (TAT 6-24 HRS) Nasopharyngeal Nasopharyngeal  Swab     Status: None   Collection Time: 12/14/20  11:36 PM   Specimen: Nasopharyngeal Swab  Result Value Ref Range Status   SARS Coronavirus 2 NEGATIVE NEGATIVE Final    Comment: (NOTE) SARS-CoV-2 target nucleic acids are NOT DETECTED.  The SARS-CoV-2 RNA is generally detectable in upper and lower respiratory specimens during the acute phase of infection. Negative results do not preclude SARS-CoV-2 infection, do not rule out co-infections with other pathogens, and should not be used as the sole basis for treatment or other patient management decisions. Negative results must be combined with clinical observations, patient history, and epidemiological information. The expected result is Negative.  Fact Sheet for Patients: SugarRoll.be  Fact Sheet for Healthcare Providers: https://www.woods-[employer] Burggraf.com/  This test is not yet approved or cleared by the [place] FDA and  has been authorized for detection and/or diagnosis of SARS-CoV-2 by FDA under an Emergency Use Authorization (EUA). This EUA will remain  in effect (meaning this test can be used) for the duration of the COVID-19 declaration under Se ction 564(b)(1) of the Act, 21 U.S.C. section 360bbb-3(b)(1), unless the authorization is terminated or revoked sooner.  Performed at [facility], [place] [address], [place], [place] [address]          Radiology Studies: NM Pulmonary Perfusion  Result Date: 12/17/2020 CLINICAL DATA:  Positive D-dimer. EXAM: NUCLEAR MEDICINE PERFUSION LUNG SCAN TECHNIQUE: Perfusion images were obtained in multiple projections after intravenous injection of radiopharmaceutical. Ventilation scans intentionally deferred if perfusion scan and chest x-ray adequate for interpretation during COVID 19 epidemic. RADIOPHARMACEUTICALS:  4.0 mCi Tc-52mMAA IV COMPARISON:  Chest x-ray 12/16/2020. FINDINGS: Perfusion study only performed. No prominent segmental  defect noted to suggest pulmonary embolus. Low probability pulmonary embolus. IMPRESSION: Low probability pulmonary embolus. Electronically Signed   By: [place]  On: 12/17/2020 15:30   DG CHEST PORT 1 VIEW  Result Date: 12/18/2020 CLINICAL DATA:  Shortness of breath. EXAM: PORTABLE CHEST 1 VIEW COMPARISON:  Radiographs 12/16/2020 and 12/14/2020.  CT 05/14/2020. FINDINGS: 0918 hours. The heart size is stable at the upper limits of normal for portable technique. There is stable vascular congestion, bibasilar atelectasis and trace bilateral pleural effusions. No changes are seen compared with the recent prior studies. The bones appear unremarkable. Telemetry leads overlie the chest. IMPRESSION: Stable chest. Vascular congestion, bibasilar atelectasis and trace bilateral pleural effusions appear unchanged. Electronically Signed   By: WRichardean SaleM.D.   On: 12/18/2020 09:29        Scheduled Meds: . allopurinol  100 mg Oral Daily  . arformoterol  15 mcg Nebulization BID   And  . umeclidinium bromide  1 puff Inhalation Daily   And  . budesonide (PULMICORT) nebulizer solution  0.5 mg Nebulization BID  . brimonidine  1 drop Both Eyes BID  . cycloSPORINE  1 drop Both Eyes BID  . dorzolamide  1 drop Both Eyes Daily  . ferrous sulfate  325 mg Oral Q breakfast  . FLUoxetine  40 mg Oral Daily  . gabapentin  200 mg Oral BID  . guaiFENesin  600 mg Oral BID  . heparin injection (subcutaneous)  5,000 Units Subcutaneous Q8H  . latanoprost  1 drop Both Eyes QHS  . magnesium oxide  400 mg Oral BID  . mycophenolate  180 mg Oral BID  . pantoprazole  40 mg Oral BID  . predniSONE  7.5 mg Oral Q breakfast  . tacrolimus  1 mg Oral BID  . valACYclovir  500 mg Oral BID  . cyanocobalamin  1,000  mcg Oral Daily   Continuous Infusions: . ampicillin-sulbactam (UNASYN) IV 3 g (12/19/20 1019)     LOS: 3 days     Georgette Shell, MD 12/19/2020, 10:39 AM

## 2020-12-19 NOTE — Progress Notes (Signed)
CC: Abdominal pain/dry heaves  Subjective: Her only complaint is she cannot breathe, no complaints of abdominal pain tolerating clears well having some loose stools.  Objective: Vital signs in last 24 hours: Temp:  [97.6 F (36.4 C)] 97.6 F (36.4 C) (01/27 0537) Pulse Rate:  [91-98] 98 (01/27 0729) Resp:  [18-24] 24 (01/27 0729) BP: (119-123)/(51-71) 119/71 (01/27 0537) SpO2:  [98 %-100 %] 100 % (01/27 0837) Last BM Date: 12/19/20 Nothing p.o. recorded 600 IV recorded Urine x1 recorded Stool x2 Afebrile vital signs are stable Creatinine 2.25    Intake/Output from previous day: 01/26 0701 - 01/27 0700 In: 600 [IV Piggyback:600] Out: -  Intake/Output this shift: Total I/O In: -  Out: 300 [Urine:300]  General appearance: alert, cooperative and no distress GI: soft, non-tender; bowel sounds normal; no masses,  no organomegaly  Lab Results:  Recent Labs    12/16/20 0952 12/17/20 0425  WBC 20.8* 20.1*  HGB 12.2 9.5*  HCT 39.3 31.4*  PLT 181 147*    BMET Recent Labs    12/18/20 0604 12/19/20 0552  NA 130* 127*  K 4.7 4.2  CL 96* 95*  CO2 15* 18*  GLUCOSE 86 97  BUN 51* 57*  CREATININE 2.52* 2.25*  CALCIUM 8.4* 8.1*   PT/INR No results for input(s): LABPROT, INR in the last 72 hours.  Recent Labs  Lab 12/14/20 2242 12/16/20 0952 12/17/20 0425 12/18/20 0604 12/19/20 0552  AST 17 21 17 15  13*  ALT 15 15 12 11 10   ALKPHOS 61 66 58 50 50  BILITOT 1.9* 3.1* 2.2* 1.3* 1.1  PROT 7.8 7.8 6.7 6.7 6.8  ALBUMIN 3.3* 3.3* 2.8* 2.8* 2.6*     Lipase     Component Value Date/Time   LIPASE 21 12/16/2020 0952     Medications: . allopurinol  100 mg Oral Daily  . arformoterol  15 mcg Nebulization BID   And  . umeclidinium bromide  1 puff Inhalation Daily   And  . budesonide (PULMICORT) nebulizer solution  0.5 mg Nebulization BID  . brimonidine  1 drop Both Eyes BID  . cycloSPORINE  1 drop Both Eyes BID  . dorzolamide  1 drop Both Eyes Daily   . ferrous sulfate  325 mg Oral Q breakfast  . FLUoxetine  40 mg Oral Daily  . gabapentin  200 mg Oral BID  . guaiFENesin  600 mg Oral BID  . heparin injection (subcutaneous)  5,000 Units Subcutaneous Q8H  . latanoprost  1 drop Both Eyes QHS  . magnesium oxide  400 mg Oral BID  . mycophenolate  180 mg Oral BID  . pantoprazole  40 mg Oral BID  . predniSONE  7.5 mg Oral Q breakfast  . tacrolimus  1 mg Oral BID  . valACYclovir  500 mg Oral BID  . cyanocobalamin  1,000 mcg Oral Daily   . ampicillin-sulbactam (UNASYN) IV 3 g (12/18/20 2301)    Assessment/Plan Polycystic disease with renal transplant 2013-Prograf/prednisone. Renal consult? Elevated Cr- Cr  1.75>>1.02>>5.85>>2.77 CHF/uncertain etiology -[id number], CXR with pulm vascular congestion. Echo ordred by [facility] Chronic COPD/history of tobacco use- On 2L PRN at home. Currently on 4L Pleuritic chest pain and SOB   - VQ 1/25:  Low probability pulmonary embolus. Age indeterminate, non occlusive deep vein thrombosis involving the right popliteal vein Hx SVT Hypoglycemia  COVID status - Ag negative. PCR has not been performed  - Per TRH - Hyponatremia   - Na 614   Umbilical  hernia- CT with fat containing umbilical hernia. This is reducible at bedside but spontaneously reoccurs   Diverticulitis - Dx 1/22 and failed outpatient tx. CT 1/59 w/ mild uncomplicated sigmoid diverticulitis. No abscess.  - WBC 20.8>>20.1(1/25) - Cont IV and bowel rest.  - No indication for emergency surgery - Hopefully will be able to avoid surgery during admission that would likely result in a colostomy  - We will follow with you  ELM:RAJHH liquids ID: FlagylCipro 1/24; Zosyn 1/24-1/25; Unasyn 1/25>> day 3 DVT: Heparin drip  Plan: Advance to full liquids.  If she does well hopefully we can put her on a soft diet and advance her to oral antibiotics tomorrow.       LOS: 3 days    Shonnie Poudrier 12/19/2020 Please see Amion

## 2020-12-19 NOTE — Care Management Important Message (Signed)
Important Message  Patient Details IM Letter given to the Patient. Name: April [name] MRN: [id number] Date of Birth: [birth year]   Medicare Important Message Given:  Yes     Kerin Salen 12/19/2020, 10:33 AM

## 2020-12-19 NOTE — Progress Notes (Signed)
[facility] Progress Note  Subjective: no c/o this am except wheezing /SOB and nervous, creat down 2.2  Vitals:   12/19/20 0537 12/19/20 0729 12/19/20 0837 12/19/20 1406  BP: 119/71   (!) 145/75  Pulse: 92 98  98  Resp: 18 (!) 24  18  Temp: 97.6 F (36.4 C)   98.2 F (36.8 C)  TempSrc: Oral   Oral  SpO2: 100%  100% 100%  Weight:      Height:        Exam: Gen chron ill appearing, nasal O2 No jvd or bruits Chest dec'd L base, no rales, bilat mild wheezing RRR no MRG Abd soft ntnd no mass or ascites +bs Ext 1+ RLE pretib edema, no other pitting edema Neuro is alert, Ox 3 , nf     Home meds:  - zyloprim 100/ protonix 40 bid  - pred 5 mg (?)/ prograf 1 bid/ myfortic 180 bid  - verapamil 240 cr qd/ imdur 60 qd/ lasix 80 qd  - prozac 40/ neurontin 200 bid  - valtrex 500 bid/ cipro po / flagyl po  - duoneb qid prn/ mucinex bid/ Breztri aerosphere 2 bid   Assessment/ Plan: 1. AKI on CKD 3a renal transplant - b/l creat 1.17- 1.51 (39- 52 egfr ml/min). F/b Dr Royce Macadamia, on usual regimen prograf, pred and myfortic. Here for acute diverticulitis. Getting IV abx. BP's dropped into 90's yest and creat bumped up to 2.5.  AKI likely due to hypotension/ hypoperfusion. No vol excess on exam/ CXR. Gave NS bolus, creat down today at 2.2, cont supportive care. Holding home po lasix, watch weights (normal ~ 75kg).  2. Anxiety - consider medicating, pt's anxiety may be contributing to some of her resp issues 3. COPD - on mult inhaled medications, per pmd 4. Gout - allopurinol 5. BP/volume - as above     Rob [name] 12/19/2020, 3:45 PM   Recent Labs  Lab 12/16/20 0952 12/17/20 0425 12/18/20 0604 12/19/20 0552  K 4.2 4.1 4.7 4.2  BUN 39* 41* 51* 57*  CREATININE 1.33* 1.67* 2.52* 2.25*  CALCIUM 9.6 8.4* 8.4* 8.1*  PHOS  --   --  6.1*  --   HGB 12.2 9.5*  --   --    Inpatient medications: . allopurinol  100 mg Oral Daily  . arformoterol  15 mcg Nebulization BID    And  . umeclidinium bromide  1 puff Inhalation Daily   And  . budesonide (PULMICORT) nebulizer solution  0.5 mg Nebulization BID  . brimonidine  1 drop Both Eyes BID  . cycloSPORINE  1 drop Both Eyes BID  . dorzolamide  1 drop Both Eyes Daily  . ferrous sulfate  325 mg Oral Q breakfast  . FLUoxetine  40 mg Oral Daily  . gabapentin  200 mg Oral BID  . guaiFENesin  600 mg Oral BID  . heparin injection (subcutaneous)  5,000 Units Subcutaneous Q8H  . isosorbide mononitrate  30 mg Oral Daily  . latanoprost  1 drop Both Eyes QHS  . magnesium oxide  400 mg Oral BID  . methylPREDNISolone (SOLU-MEDROL) injection  60 mg Intravenous Q6H  . mycophenolate  180 mg Oral BID  . pantoprazole  40 mg Oral BID  . tacrolimus  1 mg Oral BID  . valACYclovir  500 mg Oral BID  . verapamil  120 mg Oral Daily  . cyanocobalamin  1,000 mcg Oral Daily   . ampicillin-sulbactam (UNASYN) IV 3 g (12/19/20 1019)  acetaminophen **OR** acetaminophen, bisacodyl, ipratropium-albuterol, ondansetron **OR** ondansetron (ZOFRAN) IV

## 2020-12-20 DIAGNOSIS — K5792 Diverticulitis of intestine, part unspecified, without perforation or abscess without bleeding: Secondary | ICD-10-CM | POA: Diagnosis not present

## 2020-12-20 LAB — CULTURE, BLOOD (ROUTINE X 2)
Culture: NO GROWTH
Culture: NO GROWTH
Special Requests: ADEQUATE

## 2020-12-20 LAB — GLUCOSE, CAPILLARY
Glucose-Capillary: 266 mg/dL — ABNORMAL HIGH (ref 70–99)
Glucose-Capillary: 204 mg/dL — ABNORMAL HIGH (ref 70–99)
Glucose-Capillary: 310 mg/dL — ABNORMAL HIGH (ref 70–99)
Glucose-Capillary: 365 mg/dL — ABNORMAL HIGH (ref 70–99)

## 2020-12-20 LAB — BASIC METABOLIC PANEL
Anion gap: 11 (ref 5–15)
BUN: 62 mg/dL — ABNORMAL HIGH (ref 8–23)
CO2: 20 mmol/L — ABNORMAL LOW (ref 22–32)
Calcium: 8.3 mg/dL — ABNORMAL LOW (ref 8.9–10.3)
Chloride: 93 mmol/L — ABNORMAL LOW (ref 98–111)
Creatinine, Ser: 2.12 mg/dL — ABNORMAL HIGH (ref 0.44–1.00)
GFR, Estimated: 26 mL/min — ABNORMAL LOW (ref >60)
Glucose, Bld: 281 mg/dL — ABNORMAL HIGH (ref 70–99)
Potassium: 5.3 mmol/L — ABNORMAL HIGH (ref 3.5–5.1)
Sodium: 124 mmol/L — ABNORMAL LOW (ref 135–145)

## 2020-12-20 LAB — CBC
HCT: 26.6 % — ABNORMAL LOW (ref 36.0–46.0)
Hemoglobin: 8.9 g/dL — ABNORMAL LOW (ref 12.0–15.0)
MCH: 31.9 pg (ref 26.0–34.0)
MCHC: 33.5 g/dL (ref 30.0–36.0)
MCV: 95.3 fL (ref 80.0–100.0)
Platelets: UNDETERMINED 10*3/uL (ref 150–400)
RBC: 2.79 MIL/uL — ABNORMAL LOW (ref 3.87–5.11)
RDW: 18.1 % — ABNORMAL HIGH (ref 11.5–15.5)
WBC: 5.6 10*3/uL (ref 4.0–10.5)
nRBC: 0 % (ref 0.0–0.2)

## 2020-12-20 MED ORDER — SODIUM CHLORIDE 1 G PO TABS
2.0000 g | ORAL_TABLET | Freq: Two times a day (BID) | ORAL | Status: AC
  Administered 2020-12-20 – 2020-12-21 (×2): 2 g via ORAL
  Filled 2020-12-20 (×2): qty 2

## 2020-12-20 MED ORDER — SODIUM CHLORIDE 1 G PO TABS: 2.0000 g | ORAL_TABLET | Freq: Two times a day (BID) | ORAL | Status: DC

## 2020-12-20 MED ORDER — FUROSEMIDE 40 MG PO TABS
40.0000 mg | ORAL_TABLET | Freq: Every day | ORAL | Status: DC
  Administered 2020-12-20: 40 mg via ORAL
  Filled 2020-12-20: qty 1

## 2020-12-20 NOTE — Progress Notes (Signed)
Subjective: CC: Doing well. No abdominal pain. Tolerating FLD without n/v. Passing flatus. Had 4 liquid bm's yesterday.   Objective: Vital signs in last 24 hours: Temp:  [97.9 F (36.6 C)-98.2 F (36.8 C)] 97.9 F (36.6 C) (01/28 0551) Pulse Rate:  [82-98] 82 (01/28 0551) Resp:  [14-18] 14 (01/28 0551) BP: (122-145)/(72-75) 122/72 (01/28 0551) SpO2:  [93 %-100 %] 100 % (01/28 0551) Weight:  [72.9 kg-75.2 kg] 75.2 kg (01/28 0551) Last BM Date: 12/20/20  Intake/Output from previous day: 01/27 0701 - 01/28 0700 In: 780 [P.O.:480; IV Piggyback:300] Out: 300 [Urine:300] Intake/Output this shift: No intake/output data recorded.  PE: Gen: Awake and alert, NAD Heart: reg rate Abd: Soft, ND, NT, +BS, umbilical hernia mostly to fully reducible but spontaneously reocurrs.   Lab Results:  No results for input(s): WBC, HGB, HCT, PLT in the last 72 hours. BMET Recent Labs    12/18/20 0604 12/19/20 0552  NA 130* 127*  K 4.7 4.2  CL 96* 95*  CO2 15* 18*  GLUCOSE 86 97  BUN 51* 57*  CREATININE 2.52* 2.25*  CALCIUM 8.4* 8.1*   PT/INR No results for input(s): LABPROT, INR in the last 72 hours. CMP     Component Value Date/Time   NA 127 (L) 12/19/2020 0552   K 4.2 12/19/2020 0552   CL 95 (L) 12/19/2020 0552   CO2 18 (L) 12/19/2020 0552   GLUCOSE 97 12/19/2020 0552   BUN 57 (H) 12/19/2020 0552   CREATININE 2.25 (H) 12/19/2020 0552   CREATININE 1.28 (H) 11/04/2020 0952   CALCIUM 8.1 (L) 12/19/2020 0552   CALCIUM 10.8 (H) 10/13/2011 1405   PROT 6.8 12/19/2020 0552   ALBUMIN 2.6 (L) 12/19/2020 0552   AST 13 (L) 12/19/2020 0552   AST 13 (L) 11/04/2020 0952   ALT 10 12/19/2020 0552   ALT 12 11/04/2020 0952   ALKPHOS 50 12/19/2020 0552   BILITOT 1.1 12/19/2020 0552   BILITOT 0.6 11/04/2020 0952   GFRNONAA 24 (L) 12/19/2020 0552   GFRNONAA 47 (L) 11/04/2020 0952   GFRAA 51 (L) 08/05/2020 1523   Lipase     Component Value Date/Time   LIPASE 21 12/16/2020 0952        Studies/Results: DG CHEST PORT 1 VIEW  Result Date: 12/18/2020 CLINICAL DATA:  Shortness of breath. EXAM: PORTABLE CHEST 1 VIEW COMPARISON:  Radiographs 12/16/2020 and 12/14/2020.  CT 05/14/2020. FINDINGS: 0918 hours. The heart size is stable at the upper limits of normal for portable technique. There is stable vascular congestion, bibasilar atelectasis and trace bilateral pleural effusions. No changes are seen compared with the recent prior studies. The bones appear unremarkable. Telemetry leads overlie the chest. IMPRESSION: Stable chest. Vascular congestion, bibasilar atelectasis and trace bilateral pleural effusions appear unchanged. Electronically Signed   By: Richardean Sale M.D.   On: 12/18/2020 09:29    Anti-infectives: Anti-infectives (From admission, onward)   Start     Dose/Rate Route Frequency Ordered Stop   12/18/20 1100  Ampicillin-Sulbactam (UNASYN) 3 g in sodium chloride 0.9 % 100 mL IVPB        3 g 200 mL/hr over 30 Minutes Intravenous Every 12 hours 12/18/20 0729     12/17/20 1400  Ampicillin-Sulbactam (UNASYN) 3 g in sodium chloride 0.9 % 100 mL IVPB  Status:  Discontinued        3 g 200 mL/hr over 30 Minutes Intravenous Every 6 hours 12/17/20 1004 12/18/20 0729   12/16/20 2200  ciprofloxacin (CIPRO) IVPB 400 mg  Status:  Discontinued        400 mg 200 mL/hr over 60 Minutes Intravenous Every 12 hours 12/16/20 1451 12/16/20 1734   12/16/20 2200  piperacillin-tazobactam (ZOSYN) IVPB 3.375 g  Status:  Discontinued        3.375 g 12.5 mL/hr over 240 Minutes Intravenous Every 8 hours 12/16/20 1734 12/17/20 0949   12/16/20 1800  metroNIDAZOLE (FLAGYL) IVPB 500 mg  Status:  Discontinued        500 mg 100 mL/hr over 60 Minutes Intravenous Every 8 hours 12/16/20 1451 12/16/20 1734   12/16/20 1515  valACYclovir (VALTREX) tablet 500 mg        500 mg Oral 2 times daily 12/16/20 1505     12/16/20 0915  ciprofloxacin (CIPRO) IVPB 400 mg        400 mg 200 mL/hr over 60  Minutes Intravenous  Once 12/16/20 0912 12/16/20 1104   12/16/20 0915  metroNIDAZOLE (FLAGYL) IVPB 500 mg        500 mg 100 mL/hr over 60 Minutes Intravenous  Once 12/16/20 0912 12/16/20 1104       Assessment/Plan Polycystic disease with renal transplant 2013-Prograf/prednisone. Renal following Elevated Cr- Cr1.33>>1.67>>2.52>>2.25> am labs pending.  CHF/uncertain etiology - Cards following  Chronic COPD/history of tobacco use- On 2L PRN at home. Currently on 3L Pleuritic chest pain and SOB- VQ 1/25:Low probability pulmonary embolus. Age indeterminate, non occlusive deep vein thrombosis involving the right popliteal vein Hx SVT Hyponatremia  COVID status - Ag negative. PCR has not been performed  - Per [facility] -  Umbilical hernia- CT with fat containing umbilical hernia. This is reducible at bedside but spontaneously reoccurs   Diverticulitis - Dx 1/22 and failed outpatient tx. CT 4/97 w/ mild uncomplicated sigmoid diverticulitis. No abscess. - WBC 20.8>>20.1(1/25)> AM labs pending - Adv to soft diet - No indication for emergency surgery - Hopefully will be able to avoid surgery during admission that would likely result in a colostomy  - We will follow with you  FEN: Soft diet  WY:OVZCHYIFOYD 1/24;Zosyn1/24-1/25; Unasyn 1/25>> day 4 XAJ:OINOMVE subq   LOS: 4 days    Jillyn Ledger , [facility] 12/20/2020, 7:40 AM Please see Amion for pager number during day hours 7:00am-4:30pm

## 2020-12-20 NOTE — Progress Notes (Signed)
Pharmacy Antibiotic Note  April [name] is a 65 y.o. female admitted on 12/16/2020 with diverticulitis. Pharmacy consulted to dose ampicillin/sulbactam.   Today, 12/20/20  WBC now WNL  SCr 2.12, CrCl ~25 mL/min. Improving, SCr trending down  Afebrile  Today is day #5 of IV antibiotics.  Plan:  Continue ampicillin/sulbactam 3 g IV q12h for CrCl 15-29 mL/min  Follow renal function for necessary dose adjustments  Height: 5\' 1"  (154.9 cm) Weight: 75.2 kg (165 lb 12.6 oz) IBW/kg (Calculated) : 47.8  Temp (24hrs), Avg:98 F (36.7 C), Min:97.9 F (36.6 C), Max:98.2 F (36.8 C)  Recent Labs  Lab 12/14/20 2242 12/16/20 0952 12/16/20 1515 12/16/20 1758 12/17/20 0425 12/18/20 0604 12/19/20 0552 12/20/20 0704  WBC 9.6 20.8*  --   --  20.1*  --   --  5.6  CREATININE 1.16* 1.33*  --   --  1.67* 2.52* 2.25* 2.12*  LATICACIDVEN 0.9  --  0.8 0.7  --   --   --   --     Estimated Creatinine Clearance: 25.2 mL/min (A) (by C-G formula based on SCr of 2.12 mg/dL (H)).    Allergies  Allergen Reactions  . Infed [Iron Dextran] Other (See Comments)    Chest tightness  . Pentamidine Itching, Shortness Of Breath and Swelling  . Budesonide-Formoterol Fumarate     Other reaction(s): hoarseness  . Bupropion     Other reaction(s): exacerbated depression  . Crestor [Rosuvastatin]     Other reaction(s): body aches, swelling  . Duloxetine Hcl     Other reaction(s): GI SE, weepy, worsening fatigue, foggy  . Erythromycin [Erythromycin] Other (See Comments)    Mouth Ulcers  . Iohexol Other (See Comments)    Per patient "she has had a kidney transplant and should never have contrast"  . Oxycodone Nausea Only and Nausea And Vomiting  . Pravastatin Sodium     Other reaction(s): myalgias  . Erythromycin Rash    Causes breakout in mouth  . Ultram [Tramadol Hcl] Anxiety    Thank you for allowing pharmacy to be a part of this patient's care.  Antimicrobials this admission:   Piperacillin/tazobactam 1/24 >> 1/25 Ampicillin/sulbactam 1/25 >>  1 dose of ciprofloxacin + metronidazole given in ED 1/24  Dose adjustments this admission:   Microbiology results: None  [name], PharmD 12/20/20 8:48 AM

## 2020-12-20 NOTE — Plan of Care (Signed)
Patient's latest CBG 365. Discussed patient's diet with her. Stated she had lots of sweets today. We talked about how steriods can increase blood sugar and she has to watch her diet. Patient verbalized understanding. Notified [place], APP of patient's CBG. No new orders received. Will continue to monitor.

## 2020-12-20 NOTE — Progress Notes (Signed)
[facility] Progress Note  Subjective: responded well to solumedrol and xanax. Feels better today. Creat 2.1 today.   Vitals:   12/19/20 2048 12/20/20 0551 12/20/20 0749 12/20/20 0752  BP: 123/75 122/72    Pulse: 91 82    Resp: 18 14    Temp: 97.9 F (36.6 C) 97.9 F (36.6 C)    TempSrc: Oral Oral    SpO2: 93% 100% 100% 100%  Weight:  75.2 kg    Height:        Exam: Gen chron ill appearing, nasal O2 No jvd or bruits Chest dec'd L base, no rales, bilat mild wheezing RRR no MRG Abd soft ntnd no mass or ascites +bs Ext 1+ RLE pretib edema, no other pitting edema Neuro is alert, Ox 3 , nf     Home meds:  - zyloprim 100/ protonix 40 bid  - pred 5 mg (?)/ prograf 1 bid/ myfortic 180 bid  - verapamil 240 cr qd/ imdur 60 qd/ lasix 80 qd  - prozac 40/ neurontin 200 bid  - valtrex 500 bid/ cipro po / flagyl po  - duoneb qid prn/ mucinex bid/ Breztri aerosphere 2 bid   Assessment/ Plan: 1. AKI on CKD 3a renal transplant - b/l creat 1.17- 1.51 (39- 52 egfr ml/min). F/b Dr Royce Macadamia, on usual regimen prograf, pred and myfortic. Here for acute diverticulitis. Getting IV abx. BP's dropped into 90's yest and creat bumped up to 2.5.  AKI likely due to hypotension/ hypoperfusion. No vol excess on exam/ CXR. Gave NS bolus, creat down to 2.2 yest and 2.1 today. No further suggestions, would restart home po lasix upon discharge. Will sign off.  2. Hyponatremia - euvolemic, ordered fluid restrict and 2 gm bid salt tabs x 24 hrs.  3. Anxiety - getting xanax 4. COPD - on mult inhaled medications, per pmd 5. Gout - allopurinol 6. BP/volume - as above     Rob Clester Chlebowski 12/20/2020, 2:04 PM   Recent Labs  Lab 12/17/20 0425 12/18/20 0604 12/19/20 0552 12/20/20 0704  K 4.1 4.7 4.2 5.3*  BUN 41* 51* 57* 62*  CREATININE 1.67* 2.52* 2.25* 2.12*  CALCIUM 8.4* 8.4* 8.1* 8.3*  PHOS  --  6.1*  --   --   HGB 9.5*  --   --  8.9*   Inpatient medications: . allopurinol  100 mg  Oral Daily  . arformoterol  15 mcg Nebulization BID   And  . umeclidinium bromide  1 puff Inhalation Daily   And  . budesonide (PULMICORT) nebulizer solution  0.5 mg Nebulization BID  . brimonidine  1 drop Both Eyes BID  . cycloSPORINE  1 drop Both Eyes BID  . dorzolamide  1 drop Both Eyes Daily  . ferrous sulfate  325 mg Oral Q breakfast  . FLUoxetine  40 mg Oral Daily  . gabapentin  200 mg Oral BID  . guaiFENesin  600 mg Oral BID  . heparin injection (subcutaneous)  5,000 Units Subcutaneous Q8H  . isosorbide mononitrate  30 mg Oral Daily  . latanoprost  1 drop Both Eyes QHS  . magnesium oxide  400 mg Oral BID  . methylPREDNISolone (SOLU-MEDROL) injection  60 mg Intravenous Q6H  . mycophenolate  180 mg Oral BID  . pantoprazole  40 mg Oral BID  . sodium chloride  2 g Oral BID WC  . tacrolimus  1 mg Oral BID  . valACYclovir  500 mg Oral BID  . verapamil  120 mg Oral  Daily  . cyanocobalamin  1,000 mcg Oral Daily   . ampicillin-sulbactam (UNASYN) IV 3 g (12/20/20 1212)   acetaminophen **OR** acetaminophen, ALPRAZolam, bisacodyl, ipratropium-albuterol, ondansetron **OR** ondansetron (ZOFRAN) IV

## 2020-12-20 NOTE — Progress Notes (Signed)
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 Royce Macadamia, diastolic CHF, MGUS, HTN, debility and recent ED visit for loose uncomplicated sigmoid diverticulitis on 12/14/2020 returning with LLQ pain, vomiting, shortness of breath and fever.  She was a started on Cipro and Flagyl 2 days prior but was unable to take due to nausea and vomiting.  In ED, slightly tachycardic and tachypneic.  Significant labs include WBC 20.8, bicarb 19, AG 17, Cr 1.33 (1.16 two days prior), BUN 39 and total bili 3.1.  proBNP 02-01-2871.  Lactic acid negative.  CXR with vascular congestion and cardiomegaly.  COVID-19 PCR negative.  General surgery consulted.  CT abdomen and pelvis, LE [place] and TTE ordered.  CT abdomen and pelvis showed mild uncomplicated sigmoid diverticulitis, polycystic kidney or liver disease as before and unremarkable appearance of right pelvic transplanted kidney. LE [place] consistent with age-indeterminate nonocclusive DVT in the right popliteal vein not noted on previous ultrasound in 04/2020. TTE with LVEF of 70 to 75%, G2 DD, normal RVSP.   12/18/2020-patient was very tachypneic complaining of pleuritic chest pain worse with breathing, VQ scan low probability for pulmonary embolism had indeterminate DVT on heparin drip.  Her creatinine is also trending up.  Patient has history of renal transplant nephrology has been consulted.  Chest x-ray done shows fluid overload.  Not on any diuretics at this time. 1/27-she continues to c/o sob asking for solumedrol,wheezing sitting up in commode  Had some loose BM 12/20/2020 she was started on Solu-Medrol and Xanax yesterday. She is feeling better today. She feels her lungs are less tight and she is less short of  breath.  Assessment & Plan:   Active Problems:   GERD without esophagitis   Renal transplant recipient   Depression   Sepsis ([place])   SOB (shortness of breath)   Acute diverticulitis   Sigmoid diverticulitis   Acute CHF (congestive heart failure) (HCC)   Bilious vomiting with nausea  #1 acute on chronic diastolic heart failure-  Patient has been tachypneic tachycardic with increasing oxygen requirements.  She takes Lasix 80 mg daily at home which has not been restarted due to AKI. Recent echo with ejection fraction 70 to 75% and grade 2 diastolic dysfunction. I will continue IV Solu-Medrol for now. Slow taper.  #2 AKI on CKD stage IIIa in patient with history of ESRD due to polycystic kidney disease status post deceased donor transplant in 02-02-12.due to hypotension/ Continue CellCept and Prograf. Creatinine 2.12 from 2.25 from (after 500 cc NS) 2.52 from 1.67 from 1.33 on admission  #3 severe sepsis secondary to sigmoid diverticulitis present on admission.  She met sepsis criteria on admission with tachypnea tachycardia hypotension leukocytosis and endorgan damage such as AKI.  She was initially on Zosyn changed to Unasyn in the setting of AKI.  General surgery following appreciate their input. Now on regular diet.  #4 anemia of chronic disease hemoglobin stable.  #5 right popliteal DVT age indeterminate nonocclusive.  VQ scan low probability for PE.  DC heparin drip 1/26  #6 acute on chronic hypoxic respiratory failure secondary to multifactorial etiology including COPD/diastolic heart failure patient currently on 4 L to maintain her saturation above 92%.  She is on 2 L at home prior to admission.  #7  history of essential hypertension her blood pressure has been soft-she is on Imdur at home which we have been holding.    #8 thrombocytopenia chronic monitor closely.  #9 history of GERD on Protonix.  #10 worsening leukocytosis -on Unasyn  #11 hyponatremia NA dropped to 124 from127  from 130 She was getting IV antibiotics in D5 which has been changed to IV antibiotics and normal saline.  #12 hypoglycemia due to decreased p.o. intake resolved.  #13 SVT on verapamil.  This has been on hold since this morning due to hypotension.  Pressure Injury 10/23/20 Sacrum Right;Left Stage 2 -  Partial thickness loss of dermis presenting as a shallow open injury with a red, pink wound bed without slough. (Active)  10/23/20 2058  Location: Sacrum  Location Orientation: Right;Left  Staging: Stage 2 -  Partial thickness loss of dermis presenting as a shallow open injury with a red, pink wound bed without slough.  Wound Description (Comments):   Present on Admission:     Estimated body mass index is 31.32 kg/m as calculated from the following:   Height as of this encounter: 5' 1"  (1.549 m).   Weight as of this encounter: 75.2 kg.  DVT prophylaxis: Heparin Code Status: Full code Family Communication discussed with daughter [name] over the phone  disposition Plan:  Status is: Inpatient  Dispo: The patient is from: Home              Anticipated d/c is to: Home              Anticipated d/c date is: > 3 days              Patient currently is not medically stable to d/c.  She is a kidney transplant patient with CHF COPD with worsening leukocytosis and creatinine.   Difficult to place patient not sure yet.    Consultants:   Nephrology, cardiology and general surgery  Procedures: None Antimicrobials Unasyn  Subjective: Patient is sitting up in commode c/i sob doe and wheezing   Objective: Vitals:   12/19/20 2048 12/20/20 0551 12/20/20 0749 12/20/20 0752  BP: 123/75 122/72    Pulse: 91 82    Resp: 18 14    Temp: 97.9 F (36.6 C) 97.9 F (36.6 C)    TempSrc: Oral Oral    SpO2: 93% 100% 100% 100%  Weight:  75.2 kg    Height:        Intake/Output Summary (Last 24 hours) at 12/20/2020 1333 Last data filed at 12/19/2020 2245 Gross per 24 hour  Intake 540 ml  Output --   Net 540 ml   Filed Weights   12/18/20 0032 12/19/20 1553 12/20/20 0551  Weight: 75 kg 72.9 kg 75.2 kg    Examination:  General exam: Appears calm and comfortable  Respiratory system: Diminished breath sounds at the bases  cardiovascular system: S1 & S2 heard, RRR. No JVD, murmurs, rubs, gallops or clicks. No pedal edema. Gastrointestinal system: Abdomen is nondistended, soft and nontender. No organomegaly or masses felt. Normal bowel sounds heard. Central nervous system: Alert and oriented. No focal neurological deficits. Extremities: 1+ pitting edema  skin: No rashes, lesions or ulcers Psychiatry: Judgement and insight appear normal. Mood & affect appropriate.     Data Reviewed: I have personally reviewed following labs and imaging studies  CBC: Recent Labs  Lab 12/14/20 2242 12/16/20 0952 12/17/20 0425 12/20/20 0704  WBC 9.6 20.8* 20.1* 5.6  NEUTROABS 7.6  --  17.2*  --  HGB 11.3* 12.2 9.5* 8.9*  HCT 37.3 39.3 31.4* 26.6*  MCV 103.3* 102.3* 103.3* 95.3  PLT 187 181 147* PLATELET CLUMPS NOTED ON SMEAR, UNABLE TO ESTIMATE   Basic Metabolic Panel: Recent Labs  Lab 12/16/20 0952 12/17/20 0425 12/18/20 0604 12/19/20 0552 12/20/20 0704  NA 134* 133* 130* 127* 124*  K 4.2 4.1 4.7 4.2 5.3*  CL 98 100 96* 95* 93*  CO2 19* 21* 15* 18* 20*  GLUCOSE 68* 96 86 97 281*  BUN 39* 41* 51* 57* 62*  CREATININE 1.33* 1.67* 2.52* 2.25* 2.12*  CALCIUM 9.6 8.4* 8.4* 8.1* 8.3*  MG  --   --  1.9  --   --   PHOS  --   --  6.1*  --   --    GFR: Estimated Creatinine Clearance: 25.2 mL/min (A) (by C-G formula based on SCr of 2.12 mg/dL (H)). Liver Function Tests: Recent Labs  Lab 12/14/20 2242 12/16/20 0952 12/17/20 0425 12/18/20 0604 12/19/20 0552  AST 17 21 17 15  13*  ALT 15 15 12 11 10   ALKPHOS 61 66 58 50 50  BILITOT 1.9* 3.1* 2.2* 1.3* 1.1  PROT 7.8 7.8 6.7 6.7 6.8  ALBUMIN 3.3* 3.3* 2.8* 2.8* 2.6*   Recent Labs  Lab 12/16/20 0952  LIPASE 21   No results for  input(s): AMMONIA in the last 168 hours. Coagulation Profile: Recent Labs  Lab 12/14/20 2242  INR 1.1   Cardiac Enzymes: No results for input(s): CKTOTAL, CKMB, CKMBINDEX, TROPONINI in the last 168 hours. BNP (last 3 results) No results for input(s): PROBNP in the last 8760 hours. HbA1C: No results for input(s): HGBA1C in the last 72 hours. CBG: Recent Labs  Lab 12/18/20 0805 12/18/20 1144 12/18/20 1631 12/20/20 0749 12/20/20 1202  GLUCAP 72 140* 110* 266* 204*   Lipid Profile: No results for input(s): CHOL, HDL, LDLCALC, TRIG, CHOLHDL, LDLDIRECT in the last 72 hours. Thyroid Function Tests: No results for input(s): TSH, T4TOTAL, FREET4, T3FREE, THYROIDAB in the last 72 hours. Anemia Panel: No results for input(s): VITAMINB12, FOLATE, FERRITIN, TIBC, IRON, RETICCTPCT in the last 72 hours. Sepsis Labs: Recent Labs  Lab 12/14/20 2242 12/16/20 1515 12/16/20 1758  LATICACIDVEN 0.9 0.8 0.7    Recent Results (from the past 240 hour(s))  Culture, blood (Routine x 2)     Status: None   Collection Time: 12/14/20 10:42 PM   Specimen: Right Antecubital; Blood  Result Value Ref Range Status   Specimen Description   Final    RIGHT ANTECUBITAL Performed at [facility] [address], [place], [place] [address]    Special Requests   Final    BOTTLES DRAWN AEROBIC AND ANAEROBIC Blood Culture adequate volume Performed at [facility] [address], [place], [place] [address]    Culture   Final    NO GROWTH 5 DAYS Performed at [facility], [place] [address] [place], [place] [address]    Report Status 12/20/2020 FINAL  Final  Culture, blood (Routine x 2)     Status: None   Collection Time: 12/14/20 10:47 PM   Specimen: BLOOD RIGHT FOREARM  Result Value Ref Range Status   Specimen Description   Final    BLOOD RIGHT FOREARM Performed at [facility] [address], [place], [place] [address]    Special  Requests   Final    BOTTLES DRAWN AEROBIC AND ANAEROBIC Blood Culture results may not be optimal due to  an inadequate volume of blood received in culture bottles Performed at [facility] [address], [place], [place] [address]    Culture   Final    NO GROWTH 5 DAYS Performed at [facility], [place] [address] [place], [place] [address]    Report Status 12/20/2020 FINAL  Final  SARS CORONAVIRUS 2 (TAT 6-24 HRS) Nasopharyngeal Nasopharyngeal Swab     Status: None   Collection Time: 12/14/20 11:36 PM   Specimen: Nasopharyngeal Swab  Result Value Ref Range Status   SARS Coronavirus 2 NEGATIVE NEGATIVE Final    Comment: (NOTE) SARS-CoV-2 target nucleic acids are NOT DETECTED.  The SARS-CoV-2 RNA is generally detectable in upper and lower respiratory specimens during the acute phase of infection. Negative results do not preclude SARS-CoV-2 infection, do not rule out co-infections with other pathogens, and should not be used as the sole basis for treatment or other patient management decisions. Negative results must be combined with clinical observations, patient history, and epidemiological information. The expected result is Negative.  Fact Sheet for Patients: SugarRoll.be  Fact Sheet for Healthcare Providers: https://www.[employer].com/  This test is not yet approved or cleared by the [place] FDA and  has been authorized for detection and/or diagnosis of SARS-CoV-2 by FDA under an Emergency Use Authorization (EUA). This EUA will remain  in effect (meaning this test can be used) for the duration of the COVID-19 declaration under Se ction 564(b)(1) of the Act, 21 U.S.C. section 360bbb-3(b)(1), unless the authorization is terminated or revoked sooner.  Performed at [facility], [place] [address] [place], [place] [address]          Radiology Studies: No results found.      Scheduled  Meds: . allopurinol  100 mg Oral Daily  . arformoterol  15 mcg Nebulization BID   And  . umeclidinium bromide  1 puff Inhalation Daily   And  . budesonide (PULMICORT) nebulizer solution  0.5 mg Nebulization BID  . brimonidine  1 drop Both Eyes BID  . cycloSPORINE  1 drop Both Eyes BID  . dorzolamide  1 drop Both Eyes Daily  . ferrous sulfate  325 mg Oral Q breakfast  . FLUoxetine  40 mg Oral Daily  . gabapentin  200 mg Oral BID  . guaiFENesin  600 mg Oral BID  . heparin injection (subcutaneous)  5,000 Units Subcutaneous Q8H  . isosorbide mononitrate  30 mg Oral Daily  . latanoprost  1 drop Both Eyes QHS  . magnesium oxide  400 mg Oral BID  . methylPREDNISolone (SOLU-MEDROL) injection  60 mg Intravenous Q6H  . mycophenolate  180 mg Oral BID  . pantoprazole  40 mg Oral BID  . sodium chloride  2 g Oral BID WC  . tacrolimus  1 mg Oral BID  . valACYclovir  500 mg Oral BID  . verapamil  120 mg Oral Daily  . cyanocobalamin  1,000 mcg Oral Daily   Continuous Infusions: . ampicillin-sulbactam (UNASYN) IV 3 g (12/20/20 1212)     LOS: 4 days     Georgette Shell, MD 12/20/2020, 1:33 PM

## 2020-12-20 NOTE — Progress Notes (Signed)
Inpatient Diabetes Program Recommendations  AACE/ADA: New Consensus Statement on Inpatient Glycemic Control (2015)  Target Ranges:  Prepandial:   less than 140 mg/dL      Peak postprandial:   less than 180 mg/dL (1-2 hours)      Critically ill patients:  140 - 180 mg/dL   Lab Results  Component Value Date   GLUCAP 266 (H) 12/20/2020   HGBA1C 5.6 12/16/2020    Review of Glycemic Control Results for April [name], April [name] (MRN [id number]) as of 12/20/2020 09:47  Ref. Range 12/17/2020 11:46 12/18/2020 08:05 12/18/2020 11:44 12/18/2020 16:31 12/20/2020 07:49  Glucose-Capillary Latest Ref Range: 70 - 99 mg/dL 88 72 140 (H) 110 (H) 266 (H)   Diabetes history:  NA Outpatient Diabetes medications:  NA  Inpatient Diabetes Program Recommendations:    Solumedrol 60 mg Q6H started last evening.  Fasting CBG is 266 mg/dL.  If appropriate please consider,  Novolog 0-9 units TID and 0-5 QHS while on steroids.    Will continue to follow while inpatient.  Thank you, Reche Dixon, RN, BSN Diabetes Coordinator Inpatient Diabetes Program [phone] (team pager from 8a-5p)

## 2020-12-21 DIAGNOSIS — R1114 Bilious vomiting: Secondary | ICD-10-CM | POA: Diagnosis not present

## 2020-12-21 DIAGNOSIS — I5031 Acute diastolic (congestive) heart failure: Secondary | ICD-10-CM | POA: Diagnosis not present

## 2020-12-21 DIAGNOSIS — K5792 Diverticulitis of intestine, part unspecified, without perforation or abscess without bleeding: Secondary | ICD-10-CM | POA: Diagnosis not present

## 2020-12-21 LAB — OSMOLALITY, URINE: Osmolality, Ur: 250 mOsm/kg — ABNORMAL LOW (ref 300–900)

## 2020-12-21 LAB — CBC
HCT: 27.4 % — ABNORMAL LOW (ref 36.0–46.0)
Hemoglobin: 8.6 g/dL — ABNORMAL LOW (ref 12.0–15.0)
MCH: 31.4 pg (ref 26.0–34.0)
MCHC: 31.4 g/dL (ref 30.0–36.0)
MCV: 100 fL (ref 80.0–100.0)
Platelets: 111 10*3/uL — ABNORMAL LOW (ref 150–400)
RBC: 2.74 MIL/uL — ABNORMAL LOW (ref 3.87–5.11)
RDW: 17.6 % — ABNORMAL HIGH (ref 11.5–15.5)
WBC: 4.4 10*3/uL (ref 4.0–10.5)
nRBC: 0 % (ref 0.0–0.2)

## 2020-12-21 LAB — BASIC METABOLIC PANEL
Anion gap: 12 (ref 5–15)
BUN: 71 mg/dL — ABNORMAL HIGH (ref 8–23)
CO2: 20 mmol/L — ABNORMAL LOW (ref 22–32)
Calcium: 8.8 mg/dL — ABNORMAL LOW (ref 8.9–10.3)
Chloride: 91 mmol/L — ABNORMAL LOW (ref 98–111)
Creatinine, Ser: 2.46 mg/dL — ABNORMAL HIGH (ref 0.44–1.00)
GFR, Estimated: 21 mL/min — ABNORMAL LOW (ref >60)
Glucose, Bld: 227 mg/dL — ABNORMAL HIGH (ref 70–99)
Potassium: 5.2 mmol/L — ABNORMAL HIGH (ref 3.5–5.1)
Sodium: 123 mmol/L — ABNORMAL LOW (ref 135–145)

## 2020-12-21 LAB — GLUCOSE, CAPILLARY
Glucose-Capillary: 223 mg/dL — ABNORMAL HIGH (ref 70–99)
Glucose-Capillary: 277 mg/dL — ABNORMAL HIGH (ref 70–99)
Glucose-Capillary: 314 mg/dL — ABNORMAL HIGH (ref 70–99)
Glucose-Capillary: 323 mg/dL — ABNORMAL HIGH (ref 70–99)
Glucose-Capillary: 332 mg/dL — ABNORMAL HIGH (ref 70–99)
Glucose-Capillary: 348 mg/dL — ABNORMAL HIGH (ref 70–99)

## 2020-12-21 LAB — SODIUM, URINE, RANDOM: Sodium, Ur: 28 mmol/L

## 2020-12-21 MED ORDER — FUROSEMIDE 10 MG/ML IJ SOLN
40.0000 mg | Freq: Every day | INTRAMUSCULAR | Status: DC
  Administered 2020-12-21 – 2020-12-24 (×4): 40 mg via INTRAVENOUS
  Filled 2020-12-21 (×4): qty 4

## 2020-12-21 MED ORDER — SODIUM CHLORIDE 1 G PO TABS
2.0000 g | ORAL_TABLET | Freq: Two times a day (BID) | ORAL | Status: AC
  Administered 2020-12-21 – 2020-12-22 (×2): 2 g via ORAL
  Filled 2020-12-21 (×2): qty 2

## 2020-12-21 NOTE — Progress Notes (Signed)
Subjective/Chief Complaint: Patient remains afebrile Feels a little bit worse after advancing diet yesterday  No new labs   Objective: Vital signs in last 24 hours: Temp:  [98 F (36.7 C)-98.2 F (36.8 C)] 98 F (36.7 C) (01/29 0423) Pulse Rate:  [84-93] 85 (01/29 0423) Resp:  [16-18] 18 (01/29 0423) BP: (133-150)/(61-78) 138/78 (01/29 0423) SpO2:  [97 %-100 %] 100 % (01/29 0423) Weight:  [77.3 kg] 77.3 kg (01/29 0500) Last BM Date: 12/20/20  Intake/Output from previous day: 01/28 0701 - 01/29 0700 In: 235 [P.O.:235] Out: -  Intake/Output this shift: No intake/output data recorded.  General appearance: alert, cooperative and no distress GI: obese, soft, non-tender; reducible umbilical hernia  Lab Results:  Recent Labs    12/20/20 0704  WBC 5.6  HGB 8.9*  HCT 26.6*  PLT PLATELET CLUMPS NOTED ON SMEAR, UNABLE TO ESTIMATE   BMET Recent Labs    12/19/20 0552 12/20/20 0704  NA 127* 124*  K 4.2 5.3*  CL 95* 93*  CO2 18* 20*  GLUCOSE 97 281*  BUN 57* 62*  CREATININE 2.25* 2.12*  CALCIUM 8.1* 8.3*   PT/INR No results for input(s): LABPROT, INR in the last 72 hours. ABG No results for input(s): PHART, HCO3 in the last 72 hours.  Invalid input(s): PCO2, PO2  Studies/Results: No results found.  Anti-infectives: Anti-infectives (From admission, onward)   Start     Dose/Rate Route Frequency Ordered Stop   12/18/20 1100  Ampicillin-Sulbactam (UNASYN) 3 g in sodium chloride 0.9 % 100 mL IVPB        3 g 200 mL/hr over 30 Minutes Intravenous Every 12 hours 12/18/20 0729     12/17/20 1400  Ampicillin-Sulbactam (UNASYN) 3 g in sodium chloride 0.9 % 100 mL IVPB  Status:  Discontinued        3 g 200 mL/hr over 30 Minutes Intravenous Every 6 hours 12/17/20 1004 12/18/20 0729   12/16/20 2200  ciprofloxacin (CIPRO) IVPB 400 mg  Status:  Discontinued        400 mg 200 mL/hr over 60 Minutes Intravenous Every 12 hours 12/16/20 1451 12/16/20 1734   12/16/20 2200   piperacillin-tazobactam (ZOSYN) IVPB 3.375 g  Status:  Discontinued        3.375 g 12.5 mL/hr over 240 Minutes Intravenous Every 8 hours 12/16/20 1734 12/17/20 0949   12/16/20 1800  metroNIDAZOLE (FLAGYL) IVPB 500 mg  Status:  Discontinued        500 mg 100 mL/hr over 60 Minutes Intravenous Every 8 hours 12/16/20 1451 12/16/20 1734   12/16/20 1515  valACYclovir (VALTREX) tablet 500 mg        500 mg Oral 2 times daily 12/16/20 1505     12/16/20 0915  ciprofloxacin (CIPRO) IVPB 400 mg        400 mg 200 mL/hr over 60 Minutes Intravenous  Once 12/16/20 0912 12/16/20 1104   12/16/20 0915  metroNIDAZOLE (FLAGYL) IVPB 500 mg        500 mg 100 mL/hr over 60 Minutes Intravenous  Once 12/16/20 0912 12/16/20 1104      Assessment/Plan: Polycystic disease with renal transplant 2013-Prograf/prednisone. Renal following Elevated Cr- YB0.17>>5.10>>2.58>>5.27> 7.82 CHF/uncertain etiology - Cards following  Chronic COPD/history of tobacco use- On 2L PRN at home. Currently on 3L Pleuritic chest pain and SOB- VQ 1/25:Low probability pulmonary embolus. Age indeterminate, non occlusive deep vein thrombosis involving the right popliteal vein Hx SVT Hyponatremia  COVID status - Ag negative. PCR has not been  performed  - Per TR[facility] -  Umbilical hernia- CT with fat containing umbilical hernia. This is reducible at bedside but spontaneously reoccurs   Diverticulitis - Dx 1/22 and failed outpatient tx. CT 3/81 w/ mild uncomplicated sigmoid diverticulitis. No abscess. - WBC 20.8>>20.1(1/25)> 5.6 (1/28) - Continue renal diet and antibiotics - No indication for emergency surgery - Hopefully will be able to avoid surgery during admission that would likely result in a colostomy  - We will follow with you  FEN: Soft diet  OF:BPZWCHENIDP 1/24;Zosyn1/24-1/25; Unasyn 1/25>> day4 OEU:MPNTIRW subq   LOS: 5 days    Maia Petties 12/21/2020

## 2020-12-21 NOTE — Progress Notes (Signed)
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 Royce Macadamia, diastolic CHF, MGUS, HTN, debility and recent ED visit for loose uncomplicated sigmoid diverticulitis on 12/14/2020 returning with LLQ pain, vomiting, shortness of breath and fever.  She was a started on Cipro and Flagyl 2 days prior but was unable to take due to nausea and vomiting.  In ED, slightly tachycardic and tachypneic.  Significant labs include WBC 20.8, bicarb 19, AG 17, Cr 1.33 (1.16 two days prior), BUN 39 and total bili 3.1.  proBNP 01/30/2871.  Lactic acid negative.  CXR with vascular congestion and cardiomegaly.  COVID-19 PCR negative.  General surgery consulted.  CT abdomen and pelvis, LE [place] and TTE ordered.  CT abdomen and pelvis showed mild uncomplicated sigmoid diverticulitis, polycystic kidney or liver disease as before and unremarkable appearance of right pelvic transplanted kidney. LE [place] consistent with age-indeterminate nonocclusive DVT in the right popliteal vein not noted on previous ultrasound in 04/2020. TTE with LVEF of 70 to 75%, G2 DD, normal RVSP.   12/18/2020-patient was very tachypneic complaining of pleuritic chest pain worse with breathing, VQ scan low probability for pulmonary embolism had indeterminate DVT on heparin drip.  Her creatinine is also trending up.  Patient has history of renal transplant nephrology has been consulted.  Chest x-ray done shows fluid overload.  Not on any diuretics at this time. 1/27-she continues to c/o sob asking for solumedrol,wheezing sitting up in commode  Had some loose BM 12/20/2020 she was started on Solu-Medrol and Xanax yesterday. She is feeling better today. She feels her lungs are less tight and she is less short of  breath.  Assessment & Plan:   Active Problems:   GERD without esophagitis   Renal transplant recipient   Depression   Sepsis ([place])   SOB (shortness of breath)   Acute diverticulitis   Sigmoid diverticulitis   Acute CHF (congestive heart failure) (HCC)   Bilious vomiting with nausea  #1 acute on chronic diastolic heart failure-  Patient has been tachypneic tachycardic with increasing oxygen requirements.  She takes Lasix 80 mg daily at home which has not been restarted due to AKI. Recent echo with ejection fraction 70 to 75% and grade 2 diastolic dysfunction. I will continue IV Solu-Medrol for now. Slow taper.  #2 AKI on CKD stage III -due to hypotension  in patient with history of ESRD due to polycystic kidney disease status post deceased donor transplant in 2012/01/31.due to hypotension Continue CellCept and Prograf. Creatinine 2.46 from  2.12 from 2.25 from (after 500 cc NS) 2.52 from 1.67 from 1.33 on admission Will start lasix 40 mg daily dw nephrology  #3 severe sepsis secondary to sigmoid diverticulitis present on admission.  She met sepsis criteria on admission with tachypnea tachycardia hypotension leukocytosis and endorgan damage such as AKI.  She was initially on Zosyn changed to Unasyn in the setting of AKI.  General surgery following appreciate their input. Now on regular diet.  #4 anemia of chronic disease hemoglobin stable.  #5 right popliteal DVT age indeterminate nonocclusive.  VQ scan low probability for PE.  DC heparin drip 1/26  #6 acute on chronic hypoxic respiratory failure secondary to multifactorial etiology including COPD/diastolic heart failure patient currently on 4 L to maintain her saturation  above 92%.  She is on 2 L at home prior to admission.  #7 history of essential hypertension her blood pressure has been soft-she is on Imdur at home which we have been holding.    #8 thrombocytopenia chronic monitor closely.  #9 history of GERD on Protonix.  #10  worsening leukocytosis -on Unasyn  #11 hyponatremia NA dropped to 123 from 124 from127 from 130 Lasix Na tabs recheck labs in am  #12 hypoglycemia due to decreased p.o. intake resolved.  #13 SVT on verapamil.  This has been on hold since this morning due to hypotension.  #14 hyperkalemia starting lasix today will recheck in am Pressure Injury 10/23/20 Sacrum Right;Left Stage 2 -  Partial thickness loss of dermis presenting as a shallow open injury with a red, pink wound bed without slough. (Active)  10/23/20 2058  Location: Sacrum  Location Orientation: Right;Left  Staging: Stage 2 -  Partial thickness loss of dermis presenting as a shallow open injury with a red, pink wound bed without slough.  Wound Description (Comments):   Present on Admission:     Estimated body mass index is 32.2 kg/m as calculated from the following:   Height as of this encounter: 5' 1"  (1.549 m).   Weight as of this encounter: 77.3 kg.  DVT prophylaxis: Heparin Code Status: Full code Family Communication discussed with daughter [name] over the phone  disposition Plan:  Status is: Inpatient  Dispo: The patient is from: Home              Anticipated d/c is to: Home              Anticipated d/c date is: > 3 days              Patient currently is not medically stable to d/c.  She is a kidney transplant patient with CHF COPD with worsening leukocytosis and creatinine.   Difficult to place patient not sure yet.    Consultants:   Nephrology, cardiology and general surgery  Procedures: None Antimicrobials Unasyn  Subjective: Patient is c/o increasing sob and abdominal pain after eating regular food Objective: Vitals:   12/21/20 0423 12/21/20 0500 12/21/20 0718 12/21/20 0732  BP: 138/78     Pulse: 85     Resp: 18     Temp: 98 F (36.7 C)     TempSrc: Oral     SpO2: 100%  99% 99%  Weight:  77.3 kg    Height:        Intake/Output Summary (Last 24 hours) at 12/21/2020 1214 Last data filed at  12/20/2020 1300 Gross per 24 hour  Intake 235 ml  Output --  Net 235 ml   Filed Weights   12/19/20 1553 12/20/20 0551 12/21/20 0500  Weight: 72.9 kg 75.2 kg 77.3 kg    Examination:  General exam: Appears calm and comfortable  Respiratory system: Diminished breath sounds at the bases  cardiovascular system: S1 & S2 heard, RRR. No JVD, murmurs, rubs, gallops or clicks. No pedal edema. Gastrointestinal system: Abdomen is nondistended, soft and nontender. No organomegaly or masses felt. Normal bowel sounds heard. Central nervous system: Alert and oriented. No focal neurological deficits. Extremities: 1+ pitting edema  skin: No rashes, lesions or ulcers Psychiatry: Judgement and insight appear normal. Mood & affect appropriate.     Data Reviewed: I have personally reviewed following labs and imaging studies  CBC: Recent Labs  Lab 12/14/20 2242 12/16/20 9357 12/17/20 0425 12/20/20 0177 12/21/20 9390  WBC 9.6 20.8* 20.1* 5.6 4.4  NEUTROABS 7.6  --  17.2*  --   --   HGB 11.3* 12.2 9.5* 8.9* 8.6*  HCT 37.3 39.3 31.4* 26.6* 27.4*  MCV 103.3* 102.3* 103.3* 95.3 100.0  PLT 187 181 147* PLATELET CLUMPS NOTED ON SMEAR, UNABLE TO ESTIMATE 681*   Basic Metabolic Panel: Recent Labs  Lab 12/17/20 0425 12/18/20 0604 12/19/20 0552 12/20/20 0704 12/21/20 0644  NA 133* 130* 127* 124* 123*  K 4.1 4.7 4.2 5.3* 5.2*  CL 100 96* 95* 93* 91*  CO2 21* 15* 18* 20* 20*  GLUCOSE 96 86 97 281* 227*  BUN 41* 51* 57* 62* 71*  CREATININE 1.67* 2.52* 2.25* 2.12* 2.46*  CALCIUM 8.4* 8.4* 8.1* 8.3* 8.8*  MG  --  1.9  --   --   --   PHOS  --  6.1*  --   --   --    GFR: Estimated Creatinine Clearance: 22 mL/min (A) (by C-G formula based on SCr of 2.46 mg/dL (H)). Liver Function Tests: Recent Labs  Lab 12/14/20 2242 12/16/20 0952 12/17/20 0425 12/18/20 0604 12/19/20 0552  AST 17 21 17 15  13*  ALT 15 15 12 11 10   ALKPHOS 61 66 58 50 50  BILITOT 1.9* 3.1* 2.2* 1.3* 1.1  PROT 7.8 7.8 6.7  6.7 6.8  ALBUMIN 3.3* 3.3* 2.8* 2.8* 2.6*   Recent Labs  Lab 12/16/20 0952  LIPASE 21   No results for input(s): AMMONIA in the last 168 hours. Coagulation Profile: Recent Labs  Lab 12/14/20 2242  INR 1.1   Cardiac Enzymes: No results for input(s): CKTOTAL, CKMB, CKMBINDEX, TROPONINI in the last 168 hours. BNP (last 3 results) No results for input(s): PROBNP in the last 8760 hours. HbA1C: No results for input(s): HGBA1C in the last 72 hours. CBG: Recent Labs  Lab 12/20/20 1704 12/20/20 2053 12/21/20 0006 12/21/20 0425 12/21/20 0757  GLUCAP 310* 365* 348* 277* 223*   Lipid Profile: No results for input(s): CHOL, HDL, LDLCALC, TRIG, CHOLHDL, LDLDIRECT in the last 72 hours. Thyroid Function Tests: No results for input(s): TSH, T4TOTAL, FREET4, T3FREE, THYROIDAB in the last 72 hours. Anemia Panel: No results for input(s): VITAMINB12, FOLATE, FERRITIN, TIBC, IRON, RETICCTPCT in the last 72 hours. Sepsis Labs: Recent Labs  Lab 12/14/20 2242 12/16/20 1515 12/16/20 1758  LATICACIDVEN 0.9 0.8 0.7    Recent Results (from the past 240 hour(s))  Culture, blood (Routine x 2)     Status: None   Collection Time: 12/14/20 10:42 PM   Specimen: Right Antecubital; Blood  Result Value Ref Range Status   Specimen Description   Final    RIGHT ANTECUBITAL Performed at [facility] [address] [place], [place] [address]    Special Requests   Final    BOTTLES DRAWN AEROBIC AND ANAEROBIC Blood Culture adequate volume Performed at [facility] [address] [place], [place] [address]    Culture   Final    NO GROWTH 5 DAYS Performed at [facility], [place] [address], [place], [place] [address]    Report Status 12/20/2020 FINAL  Final  Culture, blood (Routine x 2)     Status: None   Collection Time: 12/14/20 10:47 PM   Specimen: BLOOD RIGHT FOREARM  Result Value Ref Range Status   Specimen Description   Final    BLOOD RIGHT  FOREARM Performed at [facility] [address] [place], [place] [address]  Special Requests   Final    BOTTLES DRAWN AEROBIC AND ANAEROBIC Blood Culture results may not be optimal due to an inadequate volume of blood received in culture bottles Performed at [facility] [address], [place], [place] [address]    Culture   Final    NO GROWTH 5 DAYS Performed at [facility], [place] [address] [name], [place] [address]    Report Status 12/20/2020 FINAL  Final  SARS CORONAVIRUS 2 (TAT 6-24 HRS) Nasopharyngeal Nasopharyngeal Swab     Status: None   Collection Time: 12/14/20 11:36 PM   Specimen: Nasopharyngeal Swab  Result Value Ref Range Status   SARS Coronavirus 2 NEGATIVE NEGATIVE Final    Comment: (NOTE) SARS-CoV-2 target nucleic acids are NOT DETECTED.  The SARS-CoV-2 RNA is generally detectable in upper and lower respiratory specimens during the acute phase of infection. Negative results do not preclude SARS-CoV-2 infection, do not rule out co-infections with other pathogens, and should not be used as the sole basis for treatment or other patient management decisions. Negative results must be combined with clinical observations, patient history, and epidemiological information. The expected result is Negative.  Fact Sheet for Patients: SugarRoll.be  Fact Sheet for Healthcare Providers: https://www.[employer].com/  This test is not yet approved or cleared by the [place] FDA and  has been authorized for detection and/or diagnosis of SARS-CoV-2 by FDA under an Emergency Use Authorization (EUA). This EUA will remain  in effect (meaning this test can be used) for the duration of the COVID-19 declaration under Se ction 564(b)(1) of the Act, 21 U.S.C. section 360bbb-3(b)(1), unless the authorization is terminated or revoked sooner.  Performed at [facility], [place]  [address] [place], [place] [address]          Radiology Studies: No results found.      Scheduled Meds: . allopurinol  100 mg Oral Daily  . arformoterol  15 mcg Nebulization BID   And  . umeclidinium bromide  1 puff Inhalation Daily   And  . budesonide (PULMICORT) nebulizer solution  0.5 mg Nebulization BID  . brimonidine  1 drop Both Eyes BID  . cycloSPORINE  1 drop Both Eyes BID  . dorzolamide  1 drop Both Eyes Daily  . ferrous sulfate  325 mg Oral Q breakfast  . FLUoxetine  40 mg Oral Daily  . gabapentin  200 mg Oral BID  . guaiFENesin  600 mg Oral BID  . heparin injection (subcutaneous)  5,000 Units Subcutaneous Q8H  . isosorbide mononitrate  30 mg Oral Daily  . latanoprost  1 drop Both Eyes QHS  . magnesium oxide  400 mg Oral BID  . methylPREDNISolone (SOLU-MEDROL) injection  60 mg Intravenous Q6H  . mycophenolate  180 mg Oral BID  . pantoprazole  40 mg Oral BID  . tacrolimus  1 mg Oral BID  . valACYclovir  500 mg Oral BID  . verapamil  120 mg Oral Daily  . cyanocobalamin  1,000 mcg Oral Daily   Continuous Infusions: . ampicillin-sulbactam (UNASYN) IV 3 g (12/21/20 0008)     LOS: 5 days     Georgette Shell, MD 12/21/2020, 12:14 PM

## 2020-12-22 DIAGNOSIS — I483 Typical atrial flutter: Secondary | ICD-10-CM | POA: Diagnosis not present

## 2020-12-22 DIAGNOSIS — K5792 Diverticulitis of intestine, part unspecified, without perforation or abscess without bleeding: Secondary | ICD-10-CM | POA: Diagnosis not present

## 2020-12-22 DIAGNOSIS — I5031 Acute diastolic (congestive) heart failure: Secondary | ICD-10-CM | POA: Diagnosis not present

## 2020-12-22 DIAGNOSIS — R1114 Bilious vomiting: Secondary | ICD-10-CM | POA: Diagnosis not present

## 2020-12-22 DIAGNOSIS — R0602 Shortness of breath: Secondary | ICD-10-CM | POA: Diagnosis not present

## 2020-12-22 LAB — COMPREHENSIVE METABOLIC PANEL
ALT: 12 U/L (ref 0–44)
AST: 13 U/L — ABNORMAL LOW (ref 15–41)
Albumin: 2.7 g/dL — ABNORMAL LOW (ref 3.5–5.0)
Alkaline Phosphatase: 63 U/L (ref 38–126)
Anion gap: 10 (ref 5–15)
BUN: 85 mg/dL — ABNORMAL HIGH (ref 8–23)
CO2: 22 mmol/L (ref 22–32)
Calcium: 9 mg/dL (ref 8.9–10.3)
Chloride: 95 mmol/L — ABNORMAL LOW (ref 98–111)
Creatinine, Ser: 2.74 mg/dL — ABNORMAL HIGH (ref 0.44–1.00)
GFR, Estimated: 19 mL/min — ABNORMAL LOW (ref >60)
Glucose, Bld: 309 mg/dL — ABNORMAL HIGH (ref 70–99)
Potassium: 5.1 mmol/L (ref 3.5–5.1)
Sodium: 127 mmol/L — ABNORMAL LOW (ref 135–145)
Total Bilirubin: 0.7 mg/dL (ref 0.3–1.2)
Total Protein: 6.8 g/dL (ref 6.5–8.1)

## 2020-12-22 LAB — GLUCOSE, CAPILLARY
Glucose-Capillary: 248 mg/dL — ABNORMAL HIGH (ref 70–99)
Glucose-Capillary: 275 mg/dL — ABNORMAL HIGH (ref 70–99)
Glucose-Capillary: 306 mg/dL — ABNORMAL HIGH (ref 70–99)
Glucose-Capillary: 308 mg/dL — ABNORMAL HIGH (ref 70–99)

## 2020-12-22 LAB — CBC
HCT: 26.7 % — ABNORMAL LOW (ref 36.0–46.0)
Hemoglobin: 8.4 g/dL — ABNORMAL LOW (ref 12.0–15.0)
MCH: 31.7 pg (ref 26.0–34.0)
MCHC: 31.5 g/dL (ref 30.0–36.0)
MCV: 100.8 fL — ABNORMAL HIGH (ref 80.0–100.0)
Platelets: 102 10*3/uL — ABNORMAL LOW (ref 150–400)
RBC: 2.65 MIL/uL — ABNORMAL LOW (ref 3.87–5.11)
RDW: 17.5 % — ABNORMAL HIGH (ref 11.5–15.5)
WBC: 3.2 10*3/uL — ABNORMAL LOW (ref 4.0–10.5)
nRBC: 0 % (ref 0.0–0.2)

## 2020-12-22 LAB — TROPONIN I (HIGH SENSITIVITY): Troponin I (High Sensitivity): 5 ng/L (ref <18)

## 2020-12-22 MED ORDER — VERAPAMIL HCL 40 MG PO TABS
40.0000 mg | ORAL_TABLET | Freq: Once | ORAL | Status: AC
  Administered 2020-12-22: 40 mg via ORAL
  Filled 2020-12-22: qty 1

## 2020-12-22 MED ORDER — DILTIAZEM LOAD VIA INFUSION
10.0000 mg | Freq: Once | INTRAVENOUS | Status: AC
  Administered 2020-12-22: 10 mg via INTRAVENOUS
  Filled 2020-12-22: qty 10

## 2020-12-22 MED ORDER — DILTIAZEM HCL-DEXTROSE 125-5 MG/125ML-% IV SOLN (PREMIX)
5.0000 mg/h | INTRAVENOUS | Status: DC
  Administered 2020-12-22: 17:00:00 5 mg/h via INTRAVENOUS
  Administered 2020-12-23 – 2020-12-25 (×7): 15 mg/h via INTRAVENOUS
  Filled 2020-12-22 (×8): qty 125

## 2020-12-22 MED ORDER — ALPRAZOLAM 0.5 MG PO TABS
0.5000 mg | ORAL_TABLET | Freq: Three times a day (TID) | ORAL | Status: DC | PRN
  Administered 2020-12-23: 04:00:00 0.5 mg via ORAL
  Filled 2020-12-22: qty 1

## 2020-12-22 MED ORDER — METOPROLOL TARTRATE 5 MG/5ML IV SOLN
5.0000 mg | Freq: Once | INTRAVENOUS | Status: AC
  Administered 2020-12-22: 5 mg via INTRAVENOUS
  Filled 2020-12-22: qty 5

## 2020-12-22 NOTE — Progress Notes (Signed)
   12/22/20 2015  Assess: MEWS Score  Temp 98.4 F (36.9 C)  BP 124/86  Pulse Rate (!) 142  ECG Heart Rate (!) 144  Resp 13  Level of Consciousness Alert  SpO2 100 %  O2 Device Nasal Cannula  O2 Flow Rate (L/min) 3 L/min  Assess: MEWS Score  MEWS Temp 0  MEWS Systolic 0  MEWS Pulse 3  MEWS RR 1  MEWS LOC 0  MEWS Score 4  MEWS Score Color Red  Assess: if the MEWS score is Yellow or Red  Were vital signs taken at a resting state? Yes  Focused Assessment No change from prior assessment  Early Detection of Sepsis Score *See Row Information* High  MEWS guidelines implemented *See Row Information* No, previously red, continue vital signs every 4 hours  Notify: Charge Nurse/RN  Name of Charge Nurse/RN Notified Hilda RN  Date Charge Nurse/RN Notified 12/22/20  Time Charge Nurse/RN Notified 2030

## 2020-12-22 NOTE — Progress Notes (Signed)
Called by Dr. Rodena Piety since the patient is now in sustained atrial flutter (typical counterclockwise on my review of ECG).  Not symptomatic, but rate in 130s, without improvement after verapamil IR 40 mg given one hour ago (in addition to 120 mg verapamil SR usual dose). About to receive metoprolol 5 mg IV. Suspect will need a diltiazem drip, which will require transfer to another unit. Prefer calcium channel blockers to beta blockers for control due to history of asthma and Raynaud's sd. This is different from her previous SVT in March and April (AV node reentry) and is not expected to respond to adenosine. Borderline indication for anticoagulation CHADSVasc 1-2 (gender and possible HTN - not sure that is an accurate diagnosis). Hold off anticoagulation for now, but start it if the arrhythmia is still present overnight.

## 2020-12-22 NOTE — Progress Notes (Signed)
Subjective/Chief Complaint: Feels much better today Has not required any pain medicine for abdominal pain - is having some headaches Several soft bowel movements yesterday Normal WBC   Objective: Vital signs in last 24 hours: Temp:  [97.9 F (36.6 C)] 97.9 F (36.6 C) (01/30 0433) Pulse Rate:  [79-82] 82 (01/30 0433) Resp:  [20-21] 20 (01/30 0433) BP: (132-142)/(77-80) 132/77 (01/30 0433) SpO2:  [99 %-100 %] 100 % (01/30 0433) Weight:  [77.9 kg] 77.9 kg (01/30 0500) Last BM Date: 12/21/20  Intake/Output from previous day: 01/29 0701 - 01/30 0700 In: 480 [P.O.:480] Out: -  Intake/Output this shift: No intake/output data recorded.   General appearance: alert, cooperative and no distress GI: obese, soft, non-tender; reducible umbilical hernia  Lab Results:  Recent Labs    12/20/20 0704 12/21/20 0644  WBC 5.6 4.4  HGB 8.9* 8.6*  HCT 26.6* 27.4*  PLT PLATELET CLUMPS NOTED ON SMEAR, UNABLE TO ESTIMATE 111*   BMET Recent Labs    12/20/20 0704 12/21/20 0644  NA 124* 123*  K 5.3* 5.2*  CL 93* 91*  CO2 20* 20*  GLUCOSE 281* 227*  BUN 62* 71*  CREATININE 2.12* 2.46*  CALCIUM 8.3* 8.8*   PT/INR No results for input(s): LABPROT, INR in the last 72 hours. ABG No results for input(s): PHART, HCO3 in the last 72 hours.  Invalid input(s): PCO2, PO2  Studies/Results: No results found.  Anti-infectives: Anti-infectives (From admission, onward)   Start     Dose/Rate Route Frequency Ordered Stop   12/18/20 1100  Ampicillin-Sulbactam (UNASYN) 3 g in sodium chloride 0.9 % 100 mL IVPB        3 g 200 mL/hr over 30 Minutes Intravenous Every 12 hours 12/18/20 0729     12/17/20 1400  Ampicillin-Sulbactam (UNASYN) 3 g in sodium chloride 0.9 % 100 mL IVPB  Status:  Discontinued        3 g 200 mL/hr over 30 Minutes Intravenous Every 6 hours 12/17/20 1004 12/18/20 0729   12/16/20 2200  ciprofloxacin (CIPRO) IVPB 400 mg  Status:  Discontinued        400 mg 200 mL/hr  over 60 Minutes Intravenous Every 12 hours 12/16/20 1451 12/16/20 1734   12/16/20 2200  piperacillin-tazobactam (ZOSYN) IVPB 3.375 g  Status:  Discontinued        3.375 g 12.5 mL/hr over 240 Minutes Intravenous Every 8 hours 12/16/20 1734 12/17/20 0949   12/16/20 1800  metroNIDAZOLE (FLAGYL) IVPB 500 mg  Status:  Discontinued        500 mg 100 mL/hr over 60 Minutes Intravenous Every 8 hours 12/16/20 1451 12/16/20 1734   12/16/20 1515  valACYclovir (VALTREX) tablet 500 mg        500 mg Oral 2 times daily 12/16/20 1505     12/16/20 0915  ciprofloxacin (CIPRO) IVPB 400 mg        400 mg 200 mL/hr over 60 Minutes Intravenous  Once 12/16/20 0912 12/16/20 1104   12/16/20 0915  metroNIDAZOLE (FLAGYL) IVPB 500 mg        500 mg 100 mL/hr over 60 Minutes Intravenous  Once 12/16/20 0912 12/16/20 1104      Assessment/Plan: Polycystic disease with renal transplant 2013-Prograf/prednisone. Renalfollowing Elevated Cr- [place]3.71>>0.62>>6.94>>8.54> 6.27 CHF/uncertain etiology -Cards following Chronic COPD/history of tobacco use- On 2L PRN at home. Currently on3L Pleuritic chest pain and SOB- VQ 1/25:Low probability pulmonary embolus. Age indeterminate, non occlusive deep vein thrombosis involving the right popliteal vein Hx SVT Hyponatremia COVID  status - Ag negative. PCR has not been performed  - Per [facility] -  Umbilical hernia- CT with fat containing umbilical hernia. This is reducible at bedside but spontaneously reoccurs   Diverticulitis - Dx 1/22 and failed outpatient tx. CT 3/79 w/ mild uncomplicated sigmoid diverticulitis. No abscess. - WBC 20.8>>20.1(1/25)> 5.6 (1/28)>4.4 (1/29) -Continue renal diet and antibiotics - No indication for emergency surgery - Hopefully will be able to avoid surgery during admission that would likely result in a colostomy  - We will follow with you  JKN:IOCO diet DI:RYRYGBBHQSU 1/24;Zosyn1/24-1/25; Unasyn 1/25>> day4 IWU:AOUMNARUOOW   LOS: 6 days    Maia Petties 12/22/2020

## 2020-12-22 NOTE — Progress Notes (Signed)
   12/22/20 1708  Assess: MEWS Score  Temp 97.7 F (36.5 C)  BP 129/81  Pulse Rate (!) 145  Resp 20  Level of Consciousness Responds to Voice  SpO2 100 %  O2 Device Nasal Cannula  O2 Flow Rate (L/min) 3 L/min  Assess: MEWS Score  MEWS Temp 0  MEWS Systolic 0  MEWS Pulse 3  MEWS RR 0  MEWS LOC 1  MEWS Score 4  MEWS Score Color Red  Assess: if the MEWS score is Yellow or Red  Were vital signs taken at a resting state? Yes  Focused Assessment No change from prior assessment  Early Detection of Sepsis Score *See Row Information* High  MEWS guidelines implemented *See Row Information* No, previously yellow, continue vital signs every 4 hours

## 2020-12-22 NOTE — Progress Notes (Signed)
   12/22/20 0937  Assess: MEWS Score  BP (!) 157/99  Pulse Rate (!) 137  SpO2 99 %  O2 Device Nasal Cannula  O2 Flow Rate (L/min) 3 L/min  Assess: MEWS Score  MEWS Temp 0  MEWS Systolic 0  MEWS Pulse 3  MEWS RR 0  MEWS LOC 0  MEWS Score 3  MEWS Score Color Yellow  Assess: if the MEWS score is Yellow or Red  Were vital signs taken at a resting state? Yes  Focused Assessment Change from prior assessment (see assessment flowsheet)  Early Detection of Sepsis Score *See Row Information* Medium  MEWS guidelines implemented *See Row Information* Yes  Escalate  MEWS: Escalate Yellow: discuss with charge nurse/RN and consider discussing with provider and RRT  Notify: Charge Nurse/RN  Name of Charge Nurse/RN Notified Kim RN  Date Charge Nurse/RN Notified 12/22/20  Time Charge Nurse/RN Notified 0945  Notify: Provider  Provider Name/Title Dr Zigmund Daniel  Date Provider Notified 12/22/20  Time Provider Notified 0945  Notification Type Page  Notification Reason Change in status  Response See new orders  Date of Provider Response 12/22/20  Time of Provider Response [phone]  Document  Patient Outcome Not stable and remains on department  Progress note created (see row info) Yes

## 2020-12-22 NOTE — Progress Notes (Signed)
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 Royce [name], diastolic CHF, MGUS, HTN, debility and recent ED visit for loose uncomplicated sigmoid diverticulitis on 12/14/2020 returning with LLQ pain, vomiting, shortness of breath and fever.  She was a started on Cipro and Flagyl 2 days prior but was unable to take due to nausea and vomiting.  In ED, slightly tachycardic and tachypneic.  Significant labs include WBC 20.8, bicarb 19, AG 17, Cr 1.33 (1.16 two days prior), BUN 39 and total bili 3.1.  proBNP Feb 16, 2871.  Lactic acid negative.  CXR with vascular congestion and cardiomegaly.  COVID-19 PCR negative.  General surgery consulted.  CT abdomen and pelvis, [name] [place] and TTE ordered.  CT abdomen and pelvis showed mild uncomplicated sigmoid diverticulitis, polycystic kidney or liver disease as before and unremarkable appearance of right pelvic transplanted kidney. [name] [place] consistent with age-indeterminate nonocclusive DVT in the right popliteal vein not noted on previous ultrasound in 04/2020. TTE with LVEF of 70 to 75%, G2 DD, normal RVSP.   12/18/2020-patient was very tachypneic complaining of pleuritic chest pain worse with breathing, VQ scan low probability for pulmonary embolism had indeterminate DVT on heparin drip.  Her creatinine is also trending up.  Patient has history of renal transplant nephrology has been consulted.  Chest x-ray done shows fluid overload.  Not on any diuretics at this time. 1/27-she continues to c/o sob asking for solumedrol,wheezing sitting up in commode  Had some loose BM 12/20/2020 she was started on Solu-Medrol and Xanax yesterday. She is feeling better today. She feels her lungs are less tight and she is less short of  breath. 12/22/2020 she was actually feeling better today however she is in a flutter with a rate in the 130s to 140s which she does not feel. She feels her breathing is better and abdominal pain and diarrhea is better and tolerating p.o. intake. She reports feeling better today than yesterday.  Assessment & Plan:   Active Problems:   GERD without esophagitis   Renal transplant recipient   Depression   Sepsis ([place])   SOB (shortness of breath)   Acute diverticulitis   Sigmoid diverticulitis   Acute CHF (congestive heart failure) (HCC)   Bilious vomiting with nausea  #1 acute on chronic diastolic heart failure-  Patient has been tachypneic tachycardic with increasing oxygen requirements.  She takes Lasix 80 mg daily at home Her Lasix has been on hold till yesterday due to elevated creatinine. She was restarted on Lasix 40 mg daily as of 12/21/2020 Recent echo with ejection fraction 70 to 75% and grade 2 diastolic dysfunction. Taper Solu-Medrol.  #2 AKI on CKD stage III -due to hypotension  in patient with history of ESRD due to polycystic kidney disease status post deceased donor transplant in 02/16/2012.due to hypotension Continue CellCept and Prograf. Creatinine 2.46 from  2.12 from 2.25 from (after 500 cc NS) 2.52 from 1.67 from 1.33 on admission  started lasix 40 mg daily dw nephrology  #3 severe sepsis secondary to sigmoid diverticulitis present on admission.  She met sepsis criteria on admission with tachypnea tachycardia hypotension leukocytosis and endorgan damage such as AKI.  She was initially on Zosyn changed to Unasyn in the setting of AKI.  General surgery following appreciate their  input. Now on regular diet.  #4 anemia of chronic disease hemoglobin stable.  #5 right popliteal DVT age indeterminate nonocclusive.  VQ scan low probability for PE.  DC heparin drip 1/26  #6 acute on chronic hypoxic respiratory failure secondary to multifactorial etiology including COPD/diastolic heart  failure patient currently on 4 L to maintain her saturation above 92%.  She is on 2 L at home prior to admission.  #7 history of essential hypertension her blood pressure has been soft-she is on Imdur at home which we have been holding.    #8 thrombocytopenia chronic monitor closely.  #9 history of GERD on Protonix.  #10 worsening leukocytosis -on Unasyn  #11 hyponatremia NA dropped to 123 from 124 from127 from 130 Lasix Na tabs recheck labs in am  #12 hypoglycemia due to decreased p.o. intake resolved.  #13 A flutter-heart rate in 130s to 140s. She has no new symptoms. She got her usual dose of verapamil 120 mg with extra 40 mg grams. She was also given an IV dose of Lopressor 5 mg. This is not improved her heart rate. Discussed with cardiology started on Cardizem drip. Transfer to progressive care.  #14 hyperkalemia -potassium 5.1 continue Lasix recheck labs in a.m.   Pressure Injury 10/23/20 Sacrum Right;Left Stage 2 -  Partial thickness loss of dermis presenting as a shallow open injury with a red, pink wound bed without slough. (Active)  10/23/20 2058  Location: Sacrum  Location Orientation: Right;Left  Staging: Stage 2 -  Partial thickness loss of dermis presenting as a shallow open injury with a red, pink wound bed without slough.  Wound Description (Comments):   Present on Admission:     Estimated body mass index is 32.45 kg/m as calculated from the following:   Height as of this encounter: $RemoveBeforeD'5\' 1"'[id number]$  (1.549 m).   Weight as of this encounter: 77.9 kg.  DVT prophylaxis: Heparin Code Status: Full code Family Communication discussed with daughter April [name] over the phone  disposition Plan:  Status is: Inpatient  Dispo: The patient is from: Home              Anticipated d/c is to: Home              Anticipated d/c date is: > 3 days              Patient currently is not medically stable to d/c.  She is a kidney transplant patient with CHF COPD with worsening leukocytosis and  creatinine.   Difficult to place patient not sure yet.    Consultants:   Nephrology, cardiology and general surgery  Procedures: None Antimicrobials Unasyn  Subjective: Patient resting in bed she is awake and she is alert she is actually feeling better today than yesterday. Breathing is better abdominal pain is better diarrhea is better tolerating diet Objective: Vitals:   12/22/20 0754 12/22/20 0758 12/22/20 0937 12/22/20 1239  BP:   (!) 157/99 (!) 138/95  Pulse:   (!) 137 (!) 138  Resp:    17  Temp:    97.9 F (36.6 C)  TempSrc:    Oral  SpO2: 100% 100% 99% 100%  Weight:      Height:        Intake/Output Summary (Last 24 hours) at 12/22/2020 1412 Last data filed at 12/22/2020 0836 Gross per 24 hour  Intake 240 ml  Output --  Net 240 ml   Filed Weights   12/20/20 0551 12/21/20 0500 12/22/20 0500  Weight: 75.2  kg 77.3 kg 77.9 kg    Examination:  General exam: Appears calm and comfortable  Respiratory system: Diminished breath sounds at the bases  cardiovascular system: S1 & S2 heard, RRR. No JVD, murmurs, rubs, gallops or clicks. No pedal edema. Gastrointestinal system: Abdomen is nondistended, soft and nontender. No organomegaly or masses felt. Normal bowel sounds heard. Central nervous system: Alert and oriented. No focal neurological deficits. Extremities: 1+ pitting edema  skin: No rashes, lesions or ulcers Psychiatry: Judgement and insight appear normal. Mood & affect appropriate.     Data Reviewed: I have personally reviewed following labs and imaging studies  CBC: Recent Labs  Lab 12/16/20 0952 12/17/20 0425 12/20/20 0704 12/21/20 0644 12/22/20 1149  WBC 20.8* 20.1* 5.6 4.4 3.2*  NEUTROABS  --  17.2*  --   --   --   HGB 12.2 9.5* 8.9* 8.6* 8.4*  HCT 39.3 31.4* 26.6* 27.4* 26.7*  MCV 102.3* 103.3* 95.3 100.0 100.8*  PLT 181 147* PLATELET CLUMPS NOTED ON SMEAR, UNABLE TO ESTIMATE 111* 503*   Basic Metabolic Panel: Recent Labs  Lab  12/18/20 0604 12/19/20 0552 12/20/20 0704 12/21/20 0644 12/22/20 1149  NA 130* 127* 124* 123* 127*  K 4.7 4.2 5.3* 5.2* 5.1  CL 96* 95* 93* 91* 95*  CO2 15* 18* 20* 20* 22  GLUCOSE 86 97 281* 227* 309*  BUN 51* 57* 62* 71* 85*  CREATININE 2.52* 2.25* 2.12* 2.46* 2.74*  CALCIUM 8.4* 8.1* 8.3* 8.8* 9.0  MG 1.9  --   --   --   --   PHOS 6.1*  --   --   --   --    GFR: Estimated Creatinine Clearance: 19.8 mL/min (A) (by C-G formula based on SCr of 2.74 mg/dL (H)). Liver Function Tests: Recent Labs  Lab 12/16/20 0952 12/17/20 0425 12/18/20 0604 12/19/20 0552 12/22/20 1149  AST _0 13* 13*  ALT _1 ALKPHOS 66 58 50 50 63  BILITOT 3.1* 2.2* 1.3* 1.1 0.7  PROT 7.8 6.7 6.7 6.8 6.8  ALBUMIN 3.3* 2.8* 2.8* 2.6* 2.7*   Recent Labs  Lab 12/16/20 0952  LIPASE 21   No results for input(s): AMMONIA in the last 168 hours. Coagulation Profile: No results for input(s): INR, PROTIME in the last 168 hours. Cardiac Enzymes: No results for input(s): CKTOTAL, CKMB, CKMBINDEX, TROPONINI in the last 168 hours. BNP (last 3 results) No results for input(s): PROBNP in the last 8760 hours. HbA1C: No results for input(s): HGBA1C in the last 72 hours. CBG: Recent Labs  Lab 12/21/20 2047 12/22/20 0003 12/22/20 0410 12/22/20 0730 12/22/20 1231  GLUCAP 332* 308* 275* 248* 306*   Lipid Profile: No results for input(s): CHOL, HDL, LDLCALC, TRIG, CHOLHDL, LDLDIRECT in the last 72 hours. Thyroid Function Tests: No results for input(s): TSH, T4TOTAL, FREET4, T3FREE, THYROIDAB in the last 72 hours. Anemia Panel: No results for input(s): VITAMINB12, FOLATE, FERRITIN, TIBC, IRON, RETICCTPCT in the last 72 hours. Sepsis Labs: Recent Labs  Lab 12/16/20 1515 12/16/20 1758  LATICACIDVEN 0.8 0.7    Recent Results (from the past 240 hour(s))  Culture, blood (Routine x 2)     Status: None   Collection Time: 12/14/20 10:42 PM   Specimen: Right Antecubital; Blood  Result  Value Ref Range Status   Specimen Description   Final    RIGHT ANTECUBITAL Performed at [facility] [address] [place], [place] [address]    Special Requests  Final    BOTTLES DRAWN AEROBIC AND ANAEROBIC Blood Culture adequate volume Performed at [facility] [address] [place], [place] [address]    Culture   Final    NO GROWTH 5 DAYS Performed at [facility], [place] [address], [place], [place] [address]    Report Status 12/20/2020 FINAL  Final  Culture, blood (Routine x 2)     Status: None   Collection Time: 12/14/20 10:47 PM   Specimen: BLOOD RIGHT FOREARM  Result Value Ref Range Status   Specimen Description   Final    BLOOD RIGHT FOREARM Performed at [facility] [address] [place], [place] [address]    Special Requests   Final    BOTTLES DRAWN AEROBIC AND ANAEROBIC Blood Culture results may not be optimal due to an inadequate volume of blood received in culture bottles Performed at [facility] [address], [place], [place] [address]    Culture   Final    NO GROWTH 5 DAYS Performed at [facility], [place] [address] [place], [place] [address]    Report Status 12/20/2020 FINAL  Final  SARS CORONAVIRUS 2 (TAT 6-24 HRS) Nasopharyngeal Nasopharyngeal Swab     Status: None   Collection Time: 12/14/20 11:36 PM   Specimen: Nasopharyngeal Swab  Result Value Ref Range Status   SARS Coronavirus 2 NEGATIVE NEGATIVE Final    Comment: (NOTE) SARS-CoV-2 target nucleic acids are NOT DETECTED.  The SARS-CoV-2 RNA is generally detectable in upper and lower respiratory specimens during the acute phase of infection. Negative results do not preclude SARS-CoV-2 infection, do not rule out co-infections with other pathogens, and should not be used as the sole basis for treatment or other patient management decisions. Negative results must be combined with clinical  observations, patient history, and epidemiological information. The expected result is Negative.  Fact Sheet for Patients: SugarRoll.be  Fact Sheet for Healthcare Providers: https://www.[facility]ial[facility].com/  This test is not yet approved or cleared by the [place] FDA and  has been authorized for detection and/or diagnosis of SARS-CoV-2 by FDA under an Emergency Use Authorization (EUA). This EUA will remain  in effect (meaning this test can be used) for the duration of the COVID-19 declaration under Se ction 564(b)(1) of the Act, 21 U.S.C. section 360bbb-3(b)(1), unless the authorization is terminated or revoked sooner.  Performed at [facility], [place] [address], [place],  [address]          Radiology Studies: No results found.      Scheduled Meds: . allopurinol  100 mg Oral Daily  . arformoterol  15 mcg Nebulization BID   And  . umeclidinium bromide  1 puff Inhalation Daily   And  . budesonide (PULMICORT) nebulizer solution  0.5 mg Nebulization BID  . brimonidine  1 drop Both Eyes BID  . cycloSPORINE  1 drop Both Eyes BID  . diltiazem  10 mg Intravenous Once  . dorzolamide  1 drop Both Eyes Daily  . ferrous sulfate  325 mg Oral Q breakfast  . FLUoxetine  40 mg Oral Daily  . furosemide  40 mg Intravenous Daily  . gabapentin  200 mg Oral BID  . guaiFENesin  600 mg Oral BID  . heparin injection (subcutaneous)  5,000 Units Subcutaneous Q8H  . isosorbide mononitrate  30 mg Oral Daily  . latanoprost  1 drop Both Eyes QHS  . magnesium oxide  400 mg Oral BID  .  methylPREDNISolone (SOLU-MEDROL) injection  60 mg Intravenous Q6H  . mycophenolate  180 mg Oral BID  . pantoprazole  40 mg Oral BID  . tacrolimus  1 mg Oral BID  . valACYclovir  500 mg Oral BID  . cyanocobalamin  1,000 mcg Oral Daily   Continuous Infusions: . ampicillin-sulbactam (UNASYN) IV 3 g (12/22/20 1349)  . diltiazem (CARDIZEM) infusion        LOS: 6 days     Georgette Shell, MD 12/22/2020, 2:12 PM

## 2020-12-23 ENCOUNTER — Inpatient Hospital Stay: Payer: Self-pay

## 2020-12-23 DIAGNOSIS — J9622 Acute and chronic respiratory failure with hypercapnia: Secondary | ICD-10-CM

## 2020-12-23 DIAGNOSIS — K5790 Diverticulosis of intestine, part unspecified, without perforation or abscess without bleeding: Secondary | ICD-10-CM | POA: Diagnosis not present

## 2020-12-23 DIAGNOSIS — I4891 Unspecified atrial fibrillation: Secondary | ICD-10-CM

## 2020-12-23 DIAGNOSIS — I5033 Acute on chronic diastolic (congestive) heart failure: Secondary | ICD-10-CM | POA: Diagnosis not present

## 2020-12-23 DIAGNOSIS — K5792 Diverticulitis of intestine, part unspecified, without perforation or abscess without bleeding: Secondary | ICD-10-CM | POA: Diagnosis not present

## 2020-12-23 DIAGNOSIS — I483 Typical atrial flutter: Secondary | ICD-10-CM | POA: Diagnosis not present

## 2020-12-23 DIAGNOSIS — J9621 Acute and chronic respiratory failure with hypoxia: Secondary | ICD-10-CM

## 2020-12-23 LAB — BLOOD GAS, ARTERIAL
Acid-base deficit: 4.3 mmol/L — ABNORMAL HIGH (ref 0.0–2.0)
Bicarbonate: 22.1 mmol/L (ref 20.0–28.0)
Drawn by: 25770
FIO2: 28
O2 Content: 2 L/min
O2 Saturation: 98.4 %
Patient temperature: 98.6
pCO2 arterial: 50.4 mmHg — ABNORMAL HIGH (ref 32.0–48.0)
pH, Arterial: 7.264 — ABNORMAL LOW (ref 7.350–7.450)
pO2, Arterial: 113 mmHg — ABNORMAL HIGH (ref 83.0–108.0)

## 2020-12-23 LAB — CBC
HCT: 28.2 % — ABNORMAL LOW (ref 36.0–46.0)
Hemoglobin: 8.6 g/dL — ABNORMAL LOW (ref 12.0–15.0)
MCH: 30.8 pg (ref 26.0–34.0)
MCHC: 30.5 g/dL (ref 30.0–36.0)
MCV: 101.1 fL — ABNORMAL HIGH (ref 80.0–100.0)
Platelets: 152 10*3/uL (ref 150–400)
RBC: 2.79 MIL/uL — ABNORMAL LOW (ref 3.87–5.11)
RDW: 17.8 % — ABNORMAL HIGH (ref 11.5–15.5)
WBC: 4.8 10*3/uL (ref 4.0–10.5)
nRBC: 0 % (ref 0.0–0.2)

## 2020-12-23 LAB — COMPREHENSIVE METABOLIC PANEL
ALT: 15 U/L (ref 0–44)
AST: 16 U/L (ref 15–41)
Albumin: 2.9 g/dL — ABNORMAL LOW (ref 3.5–5.0)
Alkaline Phosphatase: 67 U/L (ref 38–126)
Anion gap: 11 (ref 5–15)
BUN: 81 mg/dL — ABNORMAL HIGH (ref 8–23)
CO2: 22 mmol/L (ref 22–32)
Calcium: 9.4 mg/dL (ref 8.9–10.3)
Chloride: 95 mmol/L — ABNORMAL LOW (ref 98–111)
Creatinine, Ser: 2.56 mg/dL — ABNORMAL HIGH (ref 0.44–1.00)
GFR, Estimated: 20 mL/min — ABNORMAL LOW (ref >60)
Glucose, Bld: 345 mg/dL — ABNORMAL HIGH (ref 70–99)
Potassium: 5 mmol/L (ref 3.5–5.1)
Sodium: 128 mmol/L — ABNORMAL LOW (ref 135–145)
Total Bilirubin: 0.8 mg/dL (ref 0.3–1.2)
Total Protein: 7 g/dL (ref 6.5–8.1)

## 2020-12-23 LAB — GLUCOSE, CAPILLARY
Glucose-Capillary: 301 mg/dL — ABNORMAL HIGH (ref 70–99)
Glucose-Capillary: 454 mg/dL — ABNORMAL HIGH (ref 70–99)
Glucose-Capillary: 377 mg/dL — ABNORMAL HIGH (ref 70–99)
Glucose-Capillary: 291 mg/dL — ABNORMAL HIGH (ref 70–99)
Glucose-Capillary: 340 mg/dL — ABNORMAL HIGH (ref 70–99)

## 2020-12-23 LAB — MRSA PCR SCREENING: MRSA by PCR: NEGATIVE

## 2020-12-23 MED ORDER — METRONIDAZOLE 500 MG PO TABS: 500.0000 mg | ORAL_TABLET | Freq: Three times a day (TID) | ORAL | Status: DC

## 2020-12-23 MED ORDER — CHLORHEXIDINE GLUCONATE CLOTH 2 % EX PADS
6.0000 | MEDICATED_PAD | Freq: Every day | CUTANEOUS | Status: DC
  Administered 2020-12-23 – 2021-01-09 (×17): 6 via TOPICAL

## 2020-12-23 MED ORDER — INSULIN ASPART 100 UNIT/ML ~~LOC~~ SOLN
0.0000 [IU] | Freq: Every day | SUBCUTANEOUS | Status: DC
  Administered 2020-12-23: 22:00:00 4 [IU] via SUBCUTANEOUS
  Administered 2020-12-24 – 2020-12-25 (×2): 3 [IU] via SUBCUTANEOUS
  Administered 2020-12-26: 22:00:00 2 [IU] via SUBCUTANEOUS
  Administered 2020-12-27 – 2020-12-28 (×2): 3 [IU] via SUBCUTANEOUS

## 2020-12-23 MED ORDER — AMOXICILLIN-POT CLAVULANATE 500-125 MG PO TABS
1.0000 | ORAL_TABLET | Freq: Two times a day (BID) | ORAL | Status: DC
  Administered 2020-12-23 – 2020-12-28 (×10): 500 mg via ORAL
  Filled 2020-12-23 (×12): qty 1

## 2020-12-23 MED ORDER — METHYLPREDNISOLONE SODIUM SUCC 125 MG IJ SOLR
60.0000 mg | Freq: Two times a day (BID) | INTRAMUSCULAR | Status: DC
  Administered 2020-12-24 – 2020-12-26 (×6): 60 mg via INTRAVENOUS
  Filled 2020-12-23 (×7): qty 2

## 2020-12-23 MED ORDER — INSULIN GLARGINE 100 UNIT/ML ~~LOC~~ SOLN
10.0000 [IU] | Freq: Two times a day (BID) | SUBCUTANEOUS | Status: DC
  Administered 2020-12-23 – 2020-12-27 (×8): 10 [IU] via SUBCUTANEOUS
  Filled 2020-12-23 (×8): qty 0.1

## 2020-12-23 MED ORDER — HEPARIN BOLUS VIA INFUSION
1000.0000 [IU] | Freq: Once | INTRAVENOUS | Status: AC
  Administered 2020-12-23: 1000 [IU] via INTRAVENOUS
  Filled 2020-12-23: qty 1000

## 2020-12-23 MED ORDER — INSULIN ASPART 100 UNIT/ML ~~LOC~~ SOLN
20.0000 [IU] | Freq: Once | SUBCUTANEOUS | Status: AC
  Administered 2020-12-23: 20 [IU] via SUBCUTANEOUS

## 2020-12-23 MED ORDER — ORAL CARE MOUTH RINSE
15.0000 mL | Freq: Two times a day (BID) | OROMUCOSAL | Status: DC
  Administered 2020-12-24 – 2020-12-29 (×11): 15 mL via OROMUCOSAL

## 2020-12-23 MED ORDER — METOPROLOL TARTRATE 25 MG PO TABS
25.0000 mg | ORAL_TABLET | Freq: Two times a day (BID) | ORAL | Status: DC
  Administered 2020-12-23 – 2020-12-24 (×2): 25 mg via ORAL
  Filled 2020-12-23 (×2): qty 1

## 2020-12-23 MED ORDER — INSULIN ASPART 100 UNIT/ML ~~LOC~~ SOLN
0.0000 [IU] | Freq: Three times a day (TID) | SUBCUTANEOUS | Status: DC
  Administered 2020-12-23 – 2020-12-24 (×2): 5 [IU] via SUBCUTANEOUS
  Administered 2020-12-24 (×2): 3 [IU] via SUBCUTANEOUS
  Administered 2020-12-25: 5 [IU] via SUBCUTANEOUS
  Administered 2020-12-25 – 2020-12-26 (×3): 3 [IU] via SUBCUTANEOUS
  Administered 2020-12-26: 14:00:00 5 [IU] via SUBCUTANEOUS
  Administered 2020-12-27: 2 [IU] via SUBCUTANEOUS
  Administered 2020-12-27 (×2): 5 [IU] via SUBCUTANEOUS
  Administered 2020-12-28: 18:00:00 7 [IU] via SUBCUTANEOUS
  Administered 2020-12-28: 5 [IU] via SUBCUTANEOUS
  Administered 2020-12-28: 12:00:00 7 [IU] via SUBCUTANEOUS
  Administered 2020-12-29: 2 [IU] via SUBCUTANEOUS
  Administered 2020-12-30: 1 [IU] via SUBCUTANEOUS

## 2020-12-23 MED ORDER — METOPROLOL TARTRATE 5 MG/5ML IV SOLN
5.0000 mg | INTRAVENOUS | Status: DC | PRN
  Administered 2020-12-23 (×2): 5 mg via INTRAVENOUS
  Filled 2020-12-23 (×2): qty 5

## 2020-12-23 MED ORDER — APIXABAN 5 MG PO TABS
5.0000 mg | ORAL_TABLET | Freq: Two times a day (BID) | ORAL | Status: DC
  Administered 2020-12-23 – 2020-12-28 (×10): 5 mg via ORAL
  Filled 2020-12-23 (×12): qty 1

## 2020-12-23 MED ORDER — FUROSEMIDE 10 MG/ML IJ SOLN
40.0000 mg | Freq: Once | INTRAMUSCULAR | Status: AC
  Administered 2020-12-23: 40 mg via INTRAVENOUS
  Filled 2020-12-23: qty 4

## 2020-12-23 MED ORDER — APIXABAN 5 MG PO TABS: 5.0000 mg | ORAL_TABLET | Freq: Two times a day (BID) | ORAL | Status: DC

## 2020-12-23 MED ORDER — HEPARIN (PORCINE) 25000 UT/250ML-% IV SOLN
1000.0000 [IU]/h | INTRAVENOUS | Status: DC
  Administered 2020-12-23: 1000 [IU]/h via INTRAVENOUS
  Filled 2020-12-23: qty 250

## 2020-12-23 MED ORDER — SACCHAROMYCES BOULARDII 250 MG PO CAPS
250.0000 mg | ORAL_CAPSULE | Freq: Two times a day (BID) | ORAL | Status: DC
  Administered 2020-12-23 – 2020-12-28 (×11): 250 mg via ORAL
  Filled 2020-12-23 (×13): qty 1

## 2020-12-23 MED ORDER — CIPROFLOXACIN HCL 500 MG PO TABS: 500.0000 mg | ORAL_TABLET | Freq: Every day | ORAL | Status: DC

## 2020-12-23 MED ORDER — INSULIN ASPART 100 UNIT/ML ~~LOC~~ SOLN
5.0000 [IU] | Freq: Three times a day (TID) | SUBCUTANEOUS | Status: DC
  Administered 2020-12-24 – 2020-12-27 (×6): 5 [IU] via SUBCUTANEOUS

## 2020-12-23 MED ORDER — INSULIN GLARGINE 100 UNIT/ML ~~LOC~~ SOLN
10.0000 [IU] | Freq: Every day | SUBCUTANEOUS | Status: DC
  Administered 2020-12-23: 10 [IU] via SUBCUTANEOUS
  Filled 2020-12-23: qty 0.1

## 2020-12-23 NOTE — Progress Notes (Signed)
IV Team currently trying to establish IV on only available side (other side restricted). RT will return to obtain ABG.

## 2020-12-23 NOTE — Progress Notes (Signed)
Progress Note  Patient Name: April [name] Date of Encounter: 12/23/2020  [facility] Cardiologist: Sanda Klein, MD   Subjective   Unable to fully assess.  Patient very somnolent.  Inpatient Medications    Scheduled Meds: . allopurinol  100 mg Oral Daily  . arformoterol  15 mcg Nebulization BID   And  . umeclidinium bromide  1 puff Inhalation Daily   And  . budesonide (PULMICORT) nebulizer solution  0.5 mg Nebulization BID  . brimonidine  1 drop Both Eyes BID  . cycloSPORINE  1 drop Both Eyes BID  . dorzolamide  1 drop Both Eyes Daily  . ferrous sulfate  325 mg Oral Q breakfast  . FLUoxetine  40 mg Oral Daily  . furosemide  40 mg Intravenous Daily  . gabapentin  200 mg Oral BID  . guaiFENesin  600 mg Oral BID  . heparin injection (subcutaneous)  5,000 Units Subcutaneous Q8H  . insulin glargine  10 Units Subcutaneous Daily  . isosorbide mononitrate  30 mg Oral Daily  . latanoprost  1 drop Both Eyes QHS  . magnesium oxide  400 mg Oral BID  . methylPREDNISolone (SOLU-MEDROL) injection  60 mg Intravenous Q6H  . mycophenolate  180 mg Oral BID  . pantoprazole  40 mg Oral BID  . tacrolimus  1 mg Oral BID  . valACYclovir  500 mg Oral BID  . cyanocobalamin  1,000 mcg Oral Daily   Continuous Infusions: . ampicillin-sulbactam (UNASYN) IV 3 g (12/23/20 0040)  . diltiazem (CARDIZEM) infusion 15 mg/hr (12/23/20 0042)   PRN Meds: acetaminophen **OR** acetaminophen, ALPRAZolam, bisacodyl, ipratropium-albuterol, ondansetron **OR** ondansetron (ZOFRAN) IV   Vital Signs    Vitals:   12/23/20 0300 12/23/20 0411 12/23/20 0600 12/23/20 0811  BP: 136/68 122/68 (!) 145/71   Pulse: (!) 143 (!) 142 (!) 141   Resp: (!) 25 (!) 24 (!) 23   Temp:  97.8 F (36.6 C) 98.2 F (36.8 C)   TempSrc:  Oral    SpO2: 100% 100% 99% 100%  Weight:   78.5 kg   Height:        Intake/Output Summary (Last 24 hours) at 12/23/2020 0851 Last data filed at 12/23/2020 0600 Gross per 24 hour   Intake 398.15 ml  Output --  Net 398.15 ml   Last 3 Weights 12/23/2020 12/22/2020 12/21/2020  Weight (lbs) 173 lb 1 oz 171 lb 11.8 oz 170 lb 6.7 oz  Weight (kg) 78.5 kg 77.9 kg 77.3 kg      Telemetry    Atrial flutter.  Rate 140s - Personally Reviewed  ECG    No new tracings - Personally Reviewed  Physical Exam   VS:  BP 137/68   Pulse (!) 143   Temp 97.6 F (36.4 C)   Resp (!) 24   Ht 5\' 1"  (1.549 m)   Wt 78.5 kg   SpO2 100%   BMI 32.70 kg/m  , BMI Body mass index is 32.7 kg/m. GENERAL:  Somnolent.  Awakens briefly but not answering questions. HEENT: Pupils equal round and reactive, fundi not visualized, oral mucosa unremarkable NECK:  No jugular venous distention, waveform within normal limits, carotid upstroke brisk and symmetric, no bruits LUNGS:  Clear to auscultation bilaterally HEART: Tachycardic.  Regular rhythm.[name]  PMI not displaced or sustained,S1 and S2 within normal limits, no S3, no S4, no clicks, no rubs, no murmurs ABD:  Flat, positive bowel sounds normal in frequency in pitch, no bruits, no rebound, no guarding,  no midline pulsatile mass, no hepatomegaly, no splenomegaly EXT:  2 plus pulses throughout, 1+ left greater than right lower extremity edema, no cyanosis no clubbing SKIN:  No rashes no nodules NEURO:  Cranial nerves II through XII grossly intact, motor grossly intact throughout PSYCH:  Cognitively intact, oriented to person place and time   Labs    High Sensitivity Troponin:   Recent Labs  Lab 12/18/20 1042 12/18/20 1357 12/22/20 1643  TROPONINIHS 11 14 5       Chemistry Recent Labs  Lab 12/19/20 0552 12/20/20 0704 12/21/20 0644 12/22/20 1149 12/23/20 0510  NA 127*   < > 123* 127* 128*  K 4.2   < > 5.2* 5.1 5.0  CL 95*   < > 91* 95* 95*  CO2 18*   < > 20* 22 22  GLUCOSE 97   < > 227* 309* 345*  BUN 57*   < > 71* 85* 81*  CREATININE 2.25*   < > 2.46* 2.74* 2.56*  CALCIUM 8.1*   < > 8.8* 9.0 9.4  PROT 6.8  --   --  6.8 7.0   ALBUMIN 2.6*  --   --  2.7* 2.9*  AST 13*  --   --  13* 16  ALT 10  --   --  12 15  ALKPHOS 50  --   --  63 67  BILITOT 1.1  --   --  0.7 0.8  GFRNONAA 24*   < > 21* 19* 20*  ANIONGAP 14   < > 12 10 11    < > = values in this interval not displayed.     Hematology Recent Labs  Lab 12/21/20 0644 12/22/20 1149 12/23/20 0510  WBC 4.4 3.2* 4.8  RBC 2.74* 2.65* 2.79*  HGB 8.6* 8.4* 8.6*  HCT 27.4* 26.7* 28.2*  MCV 100.0 100.8* 101.1*  MCH 31.4 31.7 30.8  MCHC 31.4 31.5 30.5  RDW 17.6* 17.5* 17.8*  PLT 111* 102* 152    BNP Recent Labs  Lab 12/16/20 0952 12/18/20 1042  BNP 2,872.2* 567.0*     DDimer No results for input(s): DDIMER in the last 168 hours.   Radiology    No results found.  Cardiac Studies   ECHO 12/17/20  IMPRESSIONS   1. Left ventricular ejection fraction, by estimation, is 70 to 75%. The  left ventricle has hyperdynamic function. The left ventricle has no  regional wall motion abnormalities. Left ventricular diastolic parameters  are consistent with Grade II diastolic  dysfunction (pseudonormalization). Elevated left atrial pressure.  2. Right ventricular systolic function is normal. The right ventricular  size is normal. There is normal pulmonary artery systolic pressure.  3. The mitral valve is normal in structure. No evidence of mitral valve  regurgitation. No evidence of mitral stenosis.  4. The aortic valve is tricuspid. Aortic valve regurgitation is not  visualized. No aortic stenosis is present.  5. The inferior vena cava is normal in size with greater than 50%  respiratory variability, suggesting right atrial pressure of 3 mmHg.   Patient Profile     65 y.o. female with a hx of HTN, GERD, COPD, MGUS, HFpEF,SVT 01/2020,ESRD with renal transplant 2013 on immunosuppressants followed at [facility] and admitted 1.24.22 with diverticulitis after failing outpt therapy.  Initially followed by cardiology for hypotension, however now with new  onset atrial flutter with RVR.   Assessment & Plan    1. Atrial flutter with RVR: noted to have onset of typical atrial flutter with  RVR on EKG yesterday (confirmed by Dr. Sallyanne Kuster) with HR sustained in the 130s despite po verapamil and IV metoprolol. Recommended to start diltiazem gtt, though HR remains in the 140s. Anticoagulation not initiated yesterday due to low CHADSVasc score of 1-2 (female and possible HTN though this was felt to be an inaccurate diagnosis), though she also has a history for CHF which would give her a score of 2-3.  - Favor starting eliquis 5mg  BID for stroke ppx. This would also be in anticipation of possible TEE/DCCV this admission given persistently elevated HR's  - Continue diltiazem gtt for rate control  2. Hypotension: resolved with management of her sepsis, now with intermittently elevated blood pressures.  - Managed in the context of #1  3. Acute colitis: initially failed outpatient antibiotics due to persistent N/V/D. Now improved - tolerating PO with normal BMs. Surgery following with no indication for surgical intervention at this time.  - Continue management per primary team and surgery  4. Acute on chronic diastolic CHF: Patient with increasing O2 requirements. Home lasix initially held due to [facility]. She was restarted on lasix 40mg  daily 12/21/20. Cr peaked at 2.74 yesterday and is stable at 2.56 today (baseline ~1.3). Weight is up 8lbs from admission to 173lbs today. UOP is not documented.  - Favor increasing lasix to home dose of 80mg  daily - Continue to monitor strict I&Os and daily weights - Continue to monitor electrolytes closely and replete as needed to maintain K>4, Mg >2  5. History of SVT: episode occurred 01/2020 in the setting of an acute asthma exacerbation. No evidence this admission.  - Likely to be managed in the context of #1.  - Consider referral to EP outpatient for ablation consideration  6. AoCKD in patient with hx of renal transplant:  Cr peaked at 2.7 this admission, down to 2.56 today. Baseline appears to be ~1.3. Seen by nephrology who felt AKI was likely 2/2 hypotension/hypoperfusion and recommended restarting home lasix upon discharge.  - Continue to monitor closely - Continue transplant management per primary team/nephrology  7. Raynaud's: suspect BBlocker has been avoided due to history of raynaud's and asthma - Continue imdur        For questions or updates, please contact [facility] Please consult [email or web address] for contact info under        Signed, Abigail Butts, PA-C  12/23/2020, 8:51 AM    Attending Addendum:  History and all data above reviewed.  Patient examined.  I agree with the findings as above.  All available labs, radiology testing, previous records reviewed. Agree with documented assessment and plan. Ms. Weimer is a 56F with ESRD status post renal transplant in 2013, hypertension, COPD, chronic diastolic heart failure, SVT, and GERD admitted with diverticulitis failing outpatient therapy.  Cardiology initially evaluated her for hypotension that was thought to be due to intravascular volume depletion that improved with fluids.  She then had an elevated BNP and was gently diuresed.  She remains somewhat volume overloaded.  In the interim she also developed new onset atrial flutter which is different than her history of SVT.  Heart rates are persistently in the 140s despite an IV diltiazem infusion.  We will go ahead and start her on heparin, as this has been ongoing for 27 hours.  Plan for TEE cardioversion tomorrow pending availability in the endoscopy lab.  Continue diltiazem and we will schedule metoprolol.  Technically, given that this has been ongoing less than 48 hours she does not need TEE.  However, she is completely asymptomatic and it is unclear whether she is had episodes of atrial fib/flutter in the past.  Therefore we will go ahead and get a TEE.  Samin Milke C. Oval Linsey, MD, [facility]   12/23/2020 11:30 AM

## 2020-12-23 NOTE — Consult Note (Signed)
NAME:  April [name], MRN:  [id number], DOB:  October 30, 1956, LOS: 7 ADMISSION DATE:  12/16/2020, CONSULTATION DATE:  12/23/20 REFERRING MD:  Rodena Piety, CHIEF COMPLAINT:  Afib with RVR, poor IV access   Brief History:  H/o renal transplant, admitted with acute diverticulitis, new onset Afib with RVR, poor IV access on the floor and concern for decompensation, requesting CVC  History of Present Illness:  April [name] is a 65 year old woman admitted to the hospital on 1/24 with failed outpatient treatment for diverticulitis due to intractable nausea and vomiting.  Since admission she has developed A. fib with RVR.  Cardiology following, planning for TEE with cardioversion tomorrow.  Heart rate has remained greater than 140 for the past 24 hours.  So far improving on IV antibiotics- resolved leukocytosis and fevers.  Surgery recommending transition to oral antimicrobials to complete her course.  PCCM notified of poor IV access and requested to place central line.  Not a PICC candidate due to potential need for dialysis again in the future.  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  Normal RV.  Normal valves.  Micro Data:    Antimicrobials:  unasyn 1/25- 1/31 Ciprofloxacin 1/24 Flagyl 1/24 Zosyn 1/24  Interim History / Subjective:    Objective   Blood pressure 132/62, pulse (!) 145, temperature 97.7 F (36.5 C), temperature source Axillary, resp. rate 18, height 5\' 1"  (1.549 m), weight 78.5 kg, SpO2 100 %.        Intake/Output Summary (Last 24 hours) at 12/23/2020 1721 Last data filed at 12/23/2020 1630 Gross per 24 hour  Intake 398.15 ml  Output 750 ml  Net -351.85 ml   Filed Weights   12/21/20 0500 12/22/20  0500 12/23/20 0600  Weight: 77.3 kg 77.9 kg 78.5 kg    Examination: General: Chronically ill-appearing woman lying in bed no acute distress, awake and alert HENT:/AT, eyes anicteric Lungs: Tachypnea, breathing comfortably on nasal cannula.  Normal speech.  No wheezing. Cardiovascular: Cardiac, irregular rhythm, no murmurs Abdomen: Obese, soft, nontender Extremities: + Edema, no clubbing or cyanosis Neuro: Alert, answering all questions appropriately, moving extremities spontaneously Derm: Bruising, no rashes  Resolved Hospital Problem list     Assessment & Plan:   Afib with RVR, new onset likely due to hypervolemia, acute illness -Start eliquis 5mg  BID -Tentatively planning for TEE with cardioversion tomorrow -Continue diltiazem drip -Metoprolol IV 5 mg; start 25 mg twice daily orally Continue telemetry monitoring -Diuresis per primary  Acute hypercapneic, chronic hypoxic respiratory failure History of COPD High risk for OSA -BiPAP -repeat ABG in AM -Supplemental oxygen as required to maintain SPO2 greater than 88%  AKI on CKD, history of end-stage renal disease due to polycystic kidney disease, status post transplant. AKI felt to be due to hypoperfusion -Continue immunosuppression per nephrology -Renally dose meds, avoid nephrotoxic meds -Strict I/O -Needs diuresis -No PICC  Sepsis due to acute diverticulitis -Per surgery okay to transition to oral medications.  Starting Augmentin; oral Flagyl and ciprofloxacin have DDI with her immunosuppressants- discussed with PharmD. Discontinue Unasyn. -Appreciate surgery's assistance.  Poor peripheral IV access -Working on obtaining two PIVs; not currently requiring medications that would mandate central access.  Would like to avoid unnecessary central access in a patient that we will be  going back to the floor soon and hopefully discharging from the hospital in the coming days. We will place if there are no other  options.  Hyperglycemia likely due to steroids -Per primary's notes steroids for heart failure-would like to aggressively de-escalate this.  If she has worsened wheezing with COPD, could reescalate. -Increase Lantus to 10 units twice daily -Adding 5 units mealtime insulin in addition to sliding scale insulin 4 times daily  Best practice (evaluated daily)  Diet: NPO past midnight for TEE / DCCV Pain/Anxiety/Delirium protocol (if indicated): n/a VAP protocol (if indicated): n/a DVT prophylaxis: eliquis GI prophylaxis: pantoprazole Glucose control: basal bolus, SSI Mobility:  Disposition:SD   Goals of Care:  Last date of multidisciplinary goals of care discussion: Family and staff present:  Summary of discussion:  Follow up goals of care discussion due:  Code Status: full  Labs   CBC: Recent Labs  Lab 12/17/20 0425 12/20/20 0704 12/21/20 0644 12/22/20 1149 12/23/20 0510  WBC 20.1* 5.6 4.4 3.2* 4.8  NEUTROABS 17.2*  --   --   --   --   HGB 9.5* 8.9* 8.6* 8.4* 8.6*  HCT 31.4* 26.6* 27.4* 26.7* 28.2*  MCV 103.3* 95.3 100.0 100.8* 101.1*  PLT 147* PLATELET CLUMPS NOTED ON SMEAR, UNABLE TO ESTIMATE 111* 102* 025    Basic Metabolic Panel: Recent Labs  Lab 12/18/20 0604 12/19/20 0552 12/20/20 0704 12/21/20 0644 12/22/20 1149 12/23/20 0510  NA 130* 127* 124* 123* 127* 128*  K 4.7 4.2 5.3* 5.2* 5.1 5.0  CL 96* 95* 93* 91* 95* 95*  CO2 15* 18* 20* 20* 22 22  GLUCOSE 86 97 281* 227* 309* 345*  BUN 51* 57* 62* 71* 85* 81*  CREATININE 2.52* 2.25* 2.12* 2.46* 2.74* 2.56*  CALCIUM 8.4* 8.1* 8.3* 8.8* 9.0 9.4  MG 1.9  --   --   --   --   --   PHOS 6.1*  --   --   --   --   --    GFR: Estimated Creatinine Clearance: 21.3 mL/min (A) (by C-G formula based on SCr of 2.56 mg/dL (H)). Recent Labs  Lab 12/16/20 1758 12/17/20 0425 12/20/20 0704 12/21/20 0644 12/22/20 1149 12/23/20 0510  WBC  --    < > 5.6 4.4 3.2* 4.8  LATICACIDVEN 0.7  --   --   --   --   --    < > =  values in this interval not displayed.    Liver Function Tests: Recent Labs  Lab 12/17/20 0425 12/18/20 0604 12/19/20 0552 12/22/20 1149 12/23/20 0510  AST 17 15 13* 13* 16  ALT 12 11 10 12 15   ALKPHOS 58 50 50 63 67  BILITOT 2.2* 1.3* 1.1 0.7 0.8  PROT 6.7 6.7 6.8 6.8 7.0  ALBUMIN 2.8* 2.8* 2.6* 2.7* 2.9*   No results for input(s): LIPASE, AMYLASE in the last 168 hours. No results for input(s): AMMONIA in the last 168 hours.  ABG    Component Value Date/Time   PHART 7.264 (L) 12/23/2020 1602   PCO2ART 50.4 (H) 12/23/2020 1602   PO2ART 113 (H) 12/23/2020 1602   HCO3 22.1 12/23/2020 1602   TCO2 24 10/06/2020 2019   ACIDBASEDEF 4.3 (H) 12/23/2020 1602   O2SAT 98.4 12/23/2020 1602     Coagulation Profile: No results for input(s): INR, PROTIME in the last 168 hours.  Cardiac Enzymes: No results for input(s): CKTOTAL, CKMB, CKMBINDEX, TROPONINI in the last 168 hours.  HbA1C: Hgb A1c MFr  Bld  Date/Time Value Ref Range Status  12/16/2020 09:02 AM 5.6 4.8 - 5.6 % Final    Comment:    (NOTE) Pre diabetes:          5.7%-6.4%  Diabetes:              >6.4%  Glycemic control for   <7.0% adults with diabetes   09/10/2020 02:47 PM 5.6 4.8 - 5.6 % Final    Comment:    (NOTE)         Prediabetes: 5.7 - 6.4         Diabetes: >6.4         Glycemic control for adults with diabetes: <7.0     CBG: Recent Labs  Lab 12/22/20 0730 12/22/20 1231 12/23/20 0808 12/23/20 1130 12/23/20 1447  GLUCAP 248* 306* 301* 454* 377*    Review of Systems:   Review of Systems  Constitutional: Negative for chills and fever.  HENT: Negative for congestion.   Respiratory: Positive for cough. Negative for shortness of breath.        Chest congestion  Cardiovascular: Positive for leg swelling. Negative for chest pain.  Gastrointestinal: Negative for diarrhea, nausea and vomiting.  Musculoskeletal: Negative for joint pain.  Skin: Negative for rash.  Neurological: Positive for  tremors.     Past Medical History:  She,  has a past medical history of Arthritis, Asthma, CHF (congestive heart failure) ([place]), COPD (chronic obstructive pulmonary disease) ([employer]), Depression, Essential hypertension (02/08/2020), GERD (gastroesophageal reflux disease), Hyperlipidemia, Hyperparathyroidism, Polycystic kidney, and PONV (postoperative nausea and vomiting).   Surgical History:   Past Surgical History:  Procedure Laterality Date  . 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 BILATERAL OOPHORECTOMY    . BIOPSY  05/19/2020   Procedure: BIOPSY;  Surgeon: Carol Ada, MD;  Location: [facility] ENDOSCOPY;  Service: Endoscopy;;  . CARPAL TUNNEL RELEASE  2000  . CESAREAN SECTION    . COLONOSCOPY WITH PROPOFOL N/A 05/19/2020   Procedure: COLONOSCOPY WITH PROPOFOL;  Surgeon: Carol Ada, MD;  Location: [place];  Service: Endoscopy;  Laterality: N/A;  . ENTEROSCOPY N/A 05/19/2020   Procedure: ENTEROSCOPY;  Surgeon: Carol Ada, MD;  Location: [place];  Service: Endoscopy;  Laterality: N/A;  . INSERTION OF DIALYSIS CATHETER  03/31/2012   Procedure: INSERTION OF DIALYSIS CATHETER;  Surgeon: Angelia Mould, MD;  Location: [place];  Service: Vascular;  Laterality: N/A;  insertion of dialysis catheter right internal jugular vein  . 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 ORIF TIBIA FRACTURE Left 12/23/2018   Procedure: OPEN REDUCTION INTERNAL FIXATION (ORIF) TIBIA FRACTURE;  Surgeon: Shona Needles, MD;  Location: [place];  Service: Orthopedics;  Laterality: Left;  . ORIF TIBIA PLATEAU Right 12/23/2018   Procedure: OPEN REDUCTION INTERNAL FIXATION (ORIF) TIBIAL PLATEAU;  Surgeon: Shona Needles, MD;  Location: [place];  Service: Orthopedics;  Laterality: Right;  . ORIF WRIST FRACTURE Left 12/08/2019   Procedure: OPEN REDUCTION INTERNAL FIXATION (ORIF) WRIST FRACTURE, REPAIR LACERATION LEFT FOREARM;  Surgeon: Dorna Leitz, MD;  Location: [facility];  Service: Orthopedics;   Laterality: Left;  . TONSILLECTOMY  1967  . TOTAL HIP ARTHROPLASTY Left 12/08/2019   Procedure: TOTAL HIP ARTHROPLASTY ANTERIOR APPROACH;  Surgeon: Dorna Leitz, MD;  Location: [facility];  Service: Orthopedics;  Laterality: Left;  . TUBAL LIGATION  2010     Social History:   reports that she quit smoking about 22 years ago. Her smoking use  included cigarettes. She has a 3.75 pack-year smoking history. She has never used smokeless tobacco. She reports that she does not drink alcohol and does not use drugs.   Family History:  Her family history includes Heart disease in her father; Hypertension in her mother; Polycystic kidney disease in her brother and father.   Allergies Allergies  Allergen Reactions  . Infed [Iron Dextran] Other (See Comments)    Chest tightness  . Pentamidine Itching, Shortness Of Breath and Swelling  . Budesonide-Formoterol Fumarate     Other reaction(s): hoarseness  . Bupropion     Other reaction(s): exacerbated depression  . Crestor [Rosuvastatin]     Other reaction(s): body aches, swelling  . Duloxetine Hcl     Other reaction(s): GI SE, weepy, worsening fatigue, foggy  . Erythromycin [Erythromycin] Other (See Comments)    Mouth Ulcers  . Iohexol Other (See Comments)    Per patient "she has had a kidney transplant and should never have contrast"  . Oxycodone Nausea Only and Nausea And Vomiting  . Pravastatin Sodium     Other reaction(s): myalgias  . Erythromycin Rash    Causes breakout in mouth  . Ultram [Tramadol Hcl] Anxiety     Home Medications  Prior to Admission medications   Medication Sig Start Date End Date Taking? Authorizing Provider  acetaminophen (TYLENOL) 325 MG tablet Take 2 tablets (650 mg total) by mouth every 6 (six) hours as needed for mild pain (or Fever >/= 101). 05/22/20  Yes Elgergawy, Silver Huguenin, MD  albuterol (VENTOLIN HFA) 108 (90 Base) MCG/ACT inhaler Inhale 2 puffs into the lungs every 4 (four) hours as needed for wheezing or  shortness of breath. 01/04/20  Yes Angiulli, Lavon Paganini, PA-C  allopurinol (ZYLOPRIM) 100 MG tablet Take 100 mg by mouth daily. 11/29/20  Yes [provider]  amoxicillin (AMOXIL) 500 MG capsule Take 1,000 mg by mouth See admin instructions. 1000mg  twice daily as needed for dentist 12/09/20  Yes [provider]  brimonidine (ALPHAGAN P) 0.1 % SOLN Place 1 drop into both eyes 2 (two) times daily.    Yes [provider]  Budeson-Glycopyrrol-Formoterol (BREZTRI AEROSPHERE) 160-9-4.8 MCG/ACT AERO Inhale 2 puffs into the lungs 2 (two) times daily. 08/20/20  Yes Martyn Ehrich, NP  cholecalciferol (VITAMIN D3) 25 MCG (1000 UNIT) tablet Take 1 tablet (1,000 Units total) by mouth daily. 01/04/20  Yes Angiulli, Lavon Paganini, PA-C  cyanocobalamin 1000 MCG tablet Take 1,000 mcg by mouth daily.   Yes [provider]  cycloSPORINE (RESTASIS) 0.05 % ophthalmic emulsion Place 1 drop into both eyes 2 (two) times daily. 01/04/20  Yes Angiulli, Lavon Paganini, PA-C  dorzolamide (TRUSOPT) 2 % ophthalmic solution Place 1 drop into both eyes daily. 06/25/19  Yes [provider]  ferrous sulfate 325 (65 FE) MG tablet Take 325 mg by mouth daily with breakfast.   Yes [provider]  FLUoxetine (PROZAC) 40 MG capsule Take 1 capsule (40 mg total) by mouth daily. 01/04/20  Yes Angiulli, Lavon Paganini, PA-C  furosemide (LASIX) 80 MG tablet Take 80 mg by mouth daily. 10/30/20  Yes [provider]  gabapentin (NEURONTIN) 100 MG capsule Take 200 mg by mouth 2 (two) times daily.   Yes [provider]  guaiFENesin (MUCINEX) 600 MG 12 hr tablet Take 600 mg by mouth 2 (two) times daily.   Yes [provider]  ipratropium-albuterol (DUONEB) 0.5-2.5 (3) MG/3ML SOLN Take 3 mLs by nebulization every 6 (six) hours as needed (sob/wheezing).  08/24/20  Yes [provider]  isosorbide mononitrate (IMDUR) 60 MG 24 hr tablet Take 1 tablet (60 mg total) by mouth daily. 05/23/20  Yes  Elgergawy, Silver Huguenin, MD  magnesium oxide (MAG-OX) 400 MG tablet Take 1 tablet (400 mg total) by mouth 2 (two) times daily. 01/04/20  Yes Angiulli, Lavon Paganini, PA-C  mycophenolate (MYFORTIC) 180 MG EC tablet Take 180 mg by mouth 2 (two) times daily.   Yes [provider]  pantoprazole (PROTONIX) 40 MG tablet Take 1 tablet (40 mg total) by mouth 2 (two) times daily. 01/04/20  Yes Angiulli, Lavon Paganini, PA-C  predniSONE (DELTASONE) 10 MG tablet 40mg X3 days, 30mg  X3 days, 20mg  X3 days, 10mg X3 days, then stop. Patient taking differently: Take 10-40 mg by mouth See admin instructions. 40mg X3 days, 30mg  X3 days, 20mg  X3 days, 10mg X3 days, then stop. 12/06/20  Yes Freddi Starr, MD  PROGRAF 1 MG capsule Take 1 mg by mouth 2 (two) times daily.  03/22/19  Yes [provider]  promethazine (PHENERGAN) 25 MG tablet Take 1 tablet (25 mg total) by mouth every 8 (eight) hours as needed for up to 15 doses for nausea or vomiting. 09/02/20  Yes Curatolo, Adam, DO  TRAVATAN Z 0.004 % SOLN ophthalmic solution Place 1 drop into both eyes at bedtime. 07/03/16  Yes [provider]  valACYclovir (VALTREX) 500 MG tablet Take 500 mg by mouth 2 (two) times daily.   Yes [provider]  verapamil (CALAN-SR) 240 MG CR tablet Take 240 mg by mouth daily.   Yes [provider]  ondansetron (ZOFRAN ODT) 4 MG disintegrating tablet Take 1 tablet (4 mg total) by mouth every 8 (eight) hours as needed for nausea or vomiting. Patient not taking: Reported on 12/16/2020 12/15/20   Maudie Flakes, MD      Julian Hy, DO 12/23/20 5:42 PM [facility]

## 2020-12-23 NOTE — Progress Notes (Signed)
VSS on 1L O2 via nasal canula. Patient denies increased SOB at this time. BiPAP remains on standby.

## 2020-12-23 NOTE — Telephone Encounter (Signed)
Left message for patient to follow up on income documents

## 2020-12-23 NOTE — Progress Notes (Signed)
Inpatient Diabetes Program Recommendations  AACE/ADA: New Consensus Statement on Inpatient Glycemic Control (2015)  Target Ranges:  Prepandial:   less than 140 mg/dL      Peak postprandial:   less than 180 mg/dL (1-2 hours)      Critically ill patients:  140 - 180 mg/dL   Lab Results  Component Value Date   GLUCAP 301 (H) 12/23/2020   HGBA1C 5.6 12/16/2020    Review of Glycemic Control  Diabetes history: No hx DM Outpatient Diabetes medications: None Current orders for Inpatient glycemic control: Lantus 10 units QD  On Solumedrol 60 mg Q6H. HgbA1C - 5.6% - no hx DM 345, 301 mg/dL this am.  Inpatient Diabetes Program Recommendations:     Add Novolog 0-9 units TID with meals. If post-prandials continue to be > 180 mg/dL, may need meal coverage insulin.  Lantus 10 units QD starting this am.  Will follow.  Thank you. Lorenda Peck, RD, LDN, CDE Inpatient Diabetes Coordinator [phone]

## 2020-12-23 NOTE — Progress Notes (Signed)
Notified Lab that ABG being sent for analysis. 

## 2020-12-23 NOTE — Progress Notes (Signed)
ANTICOAGULATION CONSULT NOTE - Initial Consult  Pharmacy Consult for heparin Indication: atrial fibrillation  Allergies  Allergen Reactions  . Infed [Iron Dextran] Other (See Comments)    Chest tightness  . Pentamidine Itching, Shortness Of Breath and Swelling  . Budesonide-Formoterol Fumarate     Other reaction(s): hoarseness  . Bupropion     Other reaction(s): exacerbated depression  . Crestor [Rosuvastatin]     Other reaction(s): body aches, swelling  . Duloxetine Hcl     Other reaction(s): GI SE, weepy, worsening fatigue, foggy  . Erythromycin [Erythromycin] Other (See Comments)    Mouth Ulcers  . Iohexol Other (See Comments)    Per patient "she has had a kidney transplant and should never have contrast"  . Oxycodone Nausea Only and Nausea And Vomiting  . Pravastatin Sodium     Other reaction(s): myalgias  . Erythromycin Rash    Causes breakout in mouth  . Ultram [Tramadol Hcl] Anxiety    Patient Measurements: Height: 5\' 1"  (154.9 cm) Weight: 78.5 kg (173 lb 1 oz) IBW/kg (Calculated) : 47.8 Heparin Dosing Weight: 65 kg  Vital Signs: Temp: 97.6 F (36.4 C) (01/31 1017) Temp Source: Oral (01/31 0411) BP: 137/68 (01/31 1017) Pulse Rate: 143 (01/31 1017)  Labs: Recent Labs    12/21/20 0644 12/22/20 1149 12/22/20 1643 12/23/20 0510  HGB 8.6* 8.4*  --  8.6*  HCT 27.4* 26.7*  --  28.2*  PLT 111* 102*  --  152  CREATININE 2.46* 2.74*  --  2.56*  TROPONINIHS  --   --  5  --     Estimated Creatinine Clearance: 21.3 mL/min (A) (by C-G formula based on SCr of 2.56 mg/dL (H)).   Medical History: Past Medical History:  Diagnosis Date  . Arthritis   . Asthma   . CHF (congestive heart failure) ([place])   . COPD (chronic obstructive pulmonary disease) ([place])   . Depression   . Essential hypertension 02/08/2020  . GERD (gastroesophageal reflux disease)   . Hyperlipidemia   . Hyperparathyroidism   . Polycystic kidney   . PONV (postoperative nausea and vomiting)      Medications: Pt not taking anticoagulants PTA -Currently prescribed heparin 5000 units subcutaneously q8h. Last dose 1/31 @ 0636  Assessment: Pt is a 84 yoF with PMH significant for renal transplant in 2013 on immunosuppressants, anemia, CKD, MGUS. Pt with onset of atrial flutter, currently being followed by cardiology who has initiated heparin for anticoagulation, pharmacy to dose.   Today, 12/23/20  CBC:   Hgb (8.6) low but stable, consistent with patients baseline given history of anemia  Plt (152) improved to WNL  SCr = 2.56, CrCl ~21 mL/min  Goal of Therapy:  Heparin level 0.3-0.7 units/ml Monitor platelets by anticoagulation protocol: Yes   Plan:   Discontinue subQ heparin for DVT ppx  Give heparin bolus of 1000 units x1   Start heparin infusion at 1000 units/hr  Check HL in 8 hours and daily while on heparin  Continue to monitor H&H and platelets  Follow up long term anticoagulation plans  [name], PharmD 12/23/2020,12:15 PM

## 2020-12-23 NOTE — Progress Notes (Signed)
Pharmacy Antibiotic Note  April [name] is a 65 y.o. female admitted on 12/16/2020 with diverticulitis. Pharmacy consulted to dose ampicillin/sulbactam.   Today, 12/23/20  WBC remains WNL  SCr 2.56, CrCl ~21 mL/min.   Afebrile  Today is day #7 of IV antibiotics  Plan:  Ampicillin/sulbactam 3 g IV q12h for CrCl 15-29 mL/min  Antibiotic course to complete tonight, pharmacy to sign off.   Height: 5\' 1"  (154.9 cm) Weight: 78.5 kg (173 lb 1 oz) IBW/kg (Calculated) : 47.8  Temp (24hrs), Avg:98 F (36.7 C), Min:97.4 F (36.3 C), Max:98.4 F (36.9 C)  Recent Labs  Lab 12/16/20 1515 12/16/20 1758 12/17/20 0425 12/18/20 0604 12/19/20 0552 12/20/20 0704 12/21/20 0644 12/22/20 1149 12/23/20 0510  WBC  --   --  20.1*  --   --  5.6 4.4 3.2* 4.8  CREATININE  --   --  1.67*   < > 2.25* 2.12* 2.46* 2.74* 2.56*  LATICACIDVEN 0.8 0.7  --   --   --   --   --   --   --    < > = values in this interval not displayed.    Estimated Creatinine Clearance: 21.3 mL/min (A) (by C-G formula based on SCr of 2.56 mg/dL (H)).    Allergies  Allergen Reactions  . Infed [Iron Dextran] Other (See Comments)    Chest tightness  . Pentamidine Itching, Shortness Of Breath and Swelling  . Budesonide-Formoterol Fumarate     Other reaction(s): hoarseness  . Bupropion     Other reaction(s): exacerbated depression  . Crestor [Rosuvastatin]     Other reaction(s): body aches, swelling  . Duloxetine Hcl     Other reaction(s): GI SE, weepy, worsening fatigue, foggy  . Erythromycin [Erythromycin] Other (See Comments)    Mouth Ulcers  . Iohexol Other (See Comments)    Per patient "she has had a kidney transplant and should never have contrast"  . Oxycodone Nausea Only and Nausea And Vomiting  . Pravastatin Sodium     Other reaction(s): myalgias  . Erythromycin Rash    Causes breakout in mouth  . Ultram [Tramadol Hcl] Anxiety    Thank you for allowing pharmacy to be a part of this patient's  care.  Antimicrobials this admission:  Piperacillin/tazobactam 1/24 >> 1/25 Ampicillin/sulbactam 1/25 >> 1/31 1 dose of ciprofloxacin + metronidazole given in ED 1/24  Dose adjustments this admission:   Microbiology results: None  [name], PharmD 12/23/20 8:18 AM

## 2020-12-23 NOTE — Progress Notes (Addendum)
Per Dr. Marval Regal, nephrology, no PICC, if patient needs central access alternate central line will need to be placed.

## 2020-12-23 NOTE — Care Management Important Message (Signed)
Important Message  Patient Details IM Letter given to the Patient. Name: April [name] MRN: [id number] Date of Birth: 1956-07-06   Medicare Important Message Given:  Yes     Kerin Salen 12/23/2020, 1:50 PM

## 2020-12-23 NOTE — Progress Notes (Signed)
Pt with noon CBG of 454. MD Rodena Piety made aware and new orders placed. Will monitor patient.

## 2020-12-23 NOTE — Progress Notes (Signed)
[facility] Progress Note     Subjective: CC-  Denies abdominal pain, nausea, vomiting. Tolerating solid food. Loose BM x2 yesterday.  WBC 4.8, afebrile.  Objective: Vital signs in last 24 hours: Temp:  [97.4 F (36.3 C)-98.4 F (36.9 C)] 97.6 F (36.4 C) (01/31 1017) Pulse Rate:  [109-145] 143 (01/31 1017) Resp:  [13-25] 24 (01/31 1017) BP: (107-145)/(68-95) 137/68 (01/31 1017) SpO2:  [99 %-100 %] 100 % (01/31 0811) Weight:  [78.5 kg] 78.5 kg (01/31 0600) Last BM Date: 12/22/20  Intake/Output from previous day: 01/30 0701 - 01/31 0700 In: 638.2 [P.O.:360; I.V.:178.2; IV Piggyback:100] Out: -  Intake/Output this shift: No intake/output data recorded.  PE: Gen:  Alert, NAD, pleasant Card: tachy Pulm:  Mild tachypnea Abd: Soft, NT/ND, no HSM Psych: A&Ox4  Skin: no rashes noted, warm and dry  Lab Results:  Recent Labs    12/22/20 1149 12/23/20 0510  WBC 3.2* 4.8  HGB 8.4* 8.6*  HCT 26.7* 28.2*  PLT 102* 152   BMET Recent Labs    12/22/20 1149 12/23/20 0510  NA 127* 128*  K 5.1 5.0  CL 95* 95*  CO2 22 22  GLUCOSE 309* 345*  BUN 85* 81*  CREATININE 2.74* 2.56*  CALCIUM 9.0 9.4   PT/INR No results for input(s): LABPROT, INR in the last 72 hours. CMP     Component Value Date/Time   NA 128 (L) 12/23/2020 0510   K 5.0 12/23/2020 0510   CL 95 (L) 12/23/2020 0510   CO2 22 12/23/2020 0510   GLUCOSE 345 (H) 12/23/2020 0510   BUN 81 (H) 12/23/2020 0510   CREATININE 2.56 (H) 12/23/2020 0510   CREATININE 1.28 (H) 11/04/2020 0952   CALCIUM 9.4 12/23/2020 0510   CALCIUM 10.8 (H) 10/13/2011 1405   PROT 7.0 12/23/2020 0510   ALBUMIN 2.9 (L) 12/23/2020 0510   AST 16 12/23/2020 0510   AST 13 (L) 11/04/2020 0952   ALT 15 12/23/2020 0510   ALT 12 11/04/2020 0952   ALKPHOS 67 12/23/2020 0510   BILITOT 0.8 12/23/2020 0510   BILITOT 0.6 11/04/2020 0952   GFRNONAA 20 (L) 12/23/2020 0510   GFRNONAA 47 (L) 11/04/2020 0952   GFRAA 51 (L) 08/05/2020  1523   Lipase     Component Value Date/Time   LIPASE 21 12/16/2020 0952       Studies/Results: No results found.  Anti-infectives: Anti-infectives (From admission, onward)   Start     Dose/Rate Route Frequency Ordered Stop   12/18/20 1100  Ampicillin-Sulbactam (UNASYN) 3 g in sodium chloride 0.9 % 100 mL IVPB        3 g 200 mL/hr over 30 Minutes Intravenous Every 12 hours 12/18/20 0729 12/23/20 2359   12/17/20 1400  Ampicillin-Sulbactam (UNASYN) 3 g in sodium chloride 0.9 % 100 mL IVPB  Status:  Discontinued        3 g 200 mL/hr over 30 Minutes Intravenous Every 6 hours 12/17/20 1004 12/18/20 0729   12/16/20 2200  ciprofloxacin (CIPRO) IVPB 400 mg  Status:  Discontinued        400 mg 200 mL/hr over 60 Minutes Intravenous Every 12 hours 12/16/20 1451 12/16/20 1734   12/16/20 2200  piperacillin-tazobactam (ZOSYN) IVPB 3.375 g  Status:  Discontinued        3.375 g 12.5 mL/hr over 240 Minutes Intravenous Every 8 hours 12/16/20 1734 12/17/20 0949   12/16/20 1800  metroNIDAZOLE (FLAGYL) IVPB 500 mg  Status:  Discontinued  500 mg 100 mL/hr over 60 Minutes Intravenous Every 8 hours 12/16/20 1451 12/16/20 1734   12/16/20 1515  valACYclovir (VALTREX) tablet 500 mg        500 mg Oral 2 times daily 12/16/20 1505     12/16/20 0915  ciprofloxacin (CIPRO) IVPB 400 mg        400 mg 200 mL/hr over 60 Minutes Intravenous  Once 12/16/20 0912 12/16/20 1104   12/16/20 0915  metroNIDAZOLE (FLAGYL) IVPB 500 mg        500 mg 100 mL/hr over 60 Minutes Intravenous  Once 12/16/20 0912 12/16/20 1104       Assessment/Plan Polycystic disease with renal transplant 2013-Prograf/prednisone. Renalfollowing Elevated Cr CHF/uncertain etiology -Cards following Chronic COPD/history of tobacco use- On 2L PRN at home Pleuritic chest pain and SOB- VQ 1/25:Low probability pulmonary embolus. Age indeterminate, non occlusive deep vein thrombosis involving the right popliteal vein Hx SVT Atrial  flutter with RVR Hyponatremia COVID status - Ag negative. PCR has not been performed  - Per TRH -  Umbilical hernia- CT with fat containing umbilical hernia. This is reducible at bedside but spontaneously reoccurs   Sigmoid diverticulitis - Dx 1/22 and failed outpatient tx. CT 3/33 w/ mild uncomplicated sigmoid diverticulitis. No abscess. - WBC has normalized, abdominal pain resolved, patient tolerating renal diet and having bowel function - No indication for emergency surgery - Patient is responding well to conservative management. Ok to switch to oral antibiotics to complete 2 week course at discharge. Add probiotic. Discussed low fiber diet in the short term, and transition back to high fiber diet once this attack is resolved. Her last colonoscopy was 04/2020 by Dr. Benson Norway. Recommend outpatient follow up with GI. Follow up with surgery PRN. We will sign off, please call with concerns.   FEN: renal diet LK:[id number] 1/24;Zosyn1/24-1/25; Unasyn 1/25>>day#7 SKA:[id number]   LOS: 7 days    Wellington Hampshire, [facility] 12/23/2020, 10:43 AM Please see Amion for pager number during day hours 7:00am-4:30pm

## 2020-12-23 NOTE — Progress Notes (Signed)
PROGRESS NOTE    April [name]  MPN:[id number] DOB: 05-11-1956 DOA: 12/16/2020 PCP: [name], MD    Brief Narrative:65 year old female with history of COPD/chronic RF on 2 L, ESRD s/p deceased donor renal transplant in 05/2012 on immunosuppressants followed up at [facility], [place] followed by Dr. Royce Macadamia, diastolic CHF, MGUS, HTN, debility and recent ED visit for loose uncomplicated sigmoid diverticulitis on 12/14/2020 returning with LLQ pain, vomiting, shortness of breath and fever.  She was a started on Cipro and Flagyl 2 days prior but was unable to take due to nausea and vomiting.  In ED, slightly tachycardic and tachypneic.  Significant labs include WBC 20.8, bicarb 19, AG 17, Cr 1.33 (1.16 two days prior), BUN 39 and total bili 3.1.  proBNP 2871/02/12.  Lactic acid negative.  CXR with vascular congestion and cardiomegaly.  COVID-19 PCR negative.  General surgery consulted.  CT abdomen and pelvis, LE [place] and TTE ordered.  CT abdomen and pelvis showed mild uncomplicated sigmoid diverticulitis, polycystic kidney or liver disease as before and unremarkable appearance of right pelvic transplanted kidney. LE [place] consistent with age-indeterminate nonocclusive DVT in the right popliteal vein not noted on previous ultrasound in 04/2020. TTE with LVEF of 70 to 75%, G2 DD, normal RVSP.   12/18/2020-patient was very tachypneic complaining of pleuritic chest pain worse with breathing, VQ scan low probability for pulmonary embolism had indeterminate DVT on heparin drip.  Her creatinine is also trending up.  Patient has history of renal transplant nephrology has been consulted.  Chest x-ray done shows fluid overload.  Not on any diuretics at this time. 1/27-she continues to c/o sob asking for solumedrol,wheezing sitting up in commode  Had some loose BM 12/20/2020 she was started on Solu-Medrol and Xanax yesterday. She is feeling better today. She feels her lungs are less tight and she is less short of  breath. 12/22/2020 she was actually feeling better today however she is in a flutter with a rate in the 130s to 140s which she does not feel. She feels her breathing is better and abdominal pain and diarrhea is better and tolerating p.o. intake. She reports feeling better today than yesterday. 12/23/2020 patient was moved to 4 E. for IV Cardizem drip for rapid a flutter.  Her rate has not been come down at all she still remains in the rate of 130s to 140s asymptomatic.  Seen by cardiology today started her on heparin and plan is for TEE with cardioversion tomorrow.  Assessment & Plan:   Active Problems:   GERD without esophagitis   Renal transplant recipient   Depression   Sepsis ([place])   SOB (shortness of breath)   Acute diverticulitis   Sigmoid diverticulitis   Acute CHF (congestive heart failure) (HCC)   Bilious vomiting with nausea  #1 acute on chronic diastolic heart failure-  Patient has been tachypneic tachycardic with increasing oxygen requirements.  She takes Lasix 80 mg daily at home Her Lasix has been on hold till 12/20/2020 and then was started on Lasix 40 mg daily on 12/21/2020.  Recent echo with ejection fraction 70 to 75% and grade 2 diastolic dysfunction. Taper Solu-Medrol.  #2 AKI on CKD stage III -due to hypotension  in patient with history of ESRD due to polycystic kidney disease status post deceased donor transplant in 2012/02/13.due to hypotension Continue CellCept and Prograf. Creatinine 2.46 from  2.12 from 2.25 from (after 500 cc NS) 2.52 from 1.67 from 1.33 on admission  started lasix 40 mg daily dw  nephrology  #3 severe sepsis secondary to sigmoid diverticulitis present on admission.  She met sepsis criteria on admission with tachypnea tachycardia hypotension leukocytosis and endorgan damage such as AKI.  She was initially on Zosyn changed to Unasyn in the setting of AKI.  General surgery following appreciate their input. Now on regular diet.  General surgery signed off on  12/23/2020  #4 anemia of chronic disease hemoglobin stable.  #5 right popliteal DVT age indeterminate nonocclusive.  VQ scan low probability for PE.  DC heparin drip 1/26  #6 acute on chronic hypoxic respiratory failure secondary to multifactorial etiology including COPD/diastolic heart failure patient currently on 4 L to maintain her saturation above 92%.  She is on 2 L at home prior to admission.  #7 history of essential hypertension her blood pressure has been soft-she is on Imdur at home which we have been holding.    #8 thrombocytopenia chronic monitor closely.  #9 history of GERD on Protonix.  #10 worsening leukocytosis -on Unasyn  #11 hyponatremia NA dropped to 123 from 124 from127 from 130 Lasix Na tabs recheck labs in am  #12  Steroid-induced hyperglycemia patient started on Lantus and SSI.    #13 A flutter-heart rate in 130s to 140s. She has no new symptoms.  She has been started on Cardizem 12/22/2020 and moved her to 4 E.  Cardiology following and plan for TEE cardioversion tomorrow.  Been started on heparin drip by cardiology.    #14 hyperkalemia -potassium 5.0 continue Lasix recheck labs in a.m.   Pressure Injury 10/23/20 Sacrum Right;Left Stage 2 -  Partial thickness loss of dermis presenting as a shallow open injury with a red, pink wound bed without slough. (Active)  10/23/20 2058  Location: Sacrum  Location Orientation: Right;Left  Staging: Stage 2 -  Partial thickness loss of dermis presenting as a shallow open injury with a red, pink wound bed without slough.  Wound Description (Comments):   Present on Admission:     Estimated body mass index is 32.7 kg/m as calculated from the following:   Height as of this encounter: 5' 1"  (1.549 m).   Weight as of this encounter: 78.5 kg.  DVT prophylaxis: Heparin Code Status: Full code Family Communication discussed with daughter [name] over the phone  disposition Plan:  Status is: Inpatient  Dispo: The patient is  from: Home              Anticipated d/c is to: Home              Anticipated d/c date is: > 3 days              Patient currently is not medically stable to d/c.  She is a kidney transplant patient with CHF COPD with worsening leukocytosis and creatinine.   Difficult to place patient not sure yet.    Consultants:   Nephrology, cardiology and general surgery  Procedures: None Antimicrobials Unasyn  Subjective:  Earlier she was resting in bed she had that abdominal breathing which is typical for her at home however later on the day staff is concerned that he is sleeping more and was not able to follow conversation and be awake.  Stat ABG is ordered. Objective: Vitals:   12/23/20 0411 12/23/20 0600 12/23/20 0811 12/23/20 1017  BP: 122/68 (!) 145/71  137/68  Pulse: (!) 142 (!) 141  (!) 143  Resp: (!) 24 (!) 23  (!) 24  Temp: 97.8 F (36.6 C) 98.2 F (36.8 C)  97.6 F (36.4 C)  TempSrc: Oral     SpO2: 100% 99% 100%   Weight:  78.5 kg    Height:        Intake/Output Summary (Last 24 hours) at 12/23/2020 1355 Last data filed at 12/23/2020 0600 Gross per 24 hour  Intake 398.15 ml  Output -  Net 398.15 ml   Filed Weights   12/21/20 0500 12/22/20 0500 12/23/20 0600  Weight: 77.3 kg 77.9 kg 78.5 kg    Examination:  General exam: Appears calm and comfortable  Respiratory system: Diminished breath sounds at the bases  cardiovascular system: S1 & S2 heard, RRR. No JVD, murmurs, rubs, gallops or clicks. No pedal edema. Gastrointestinal system: Abdomen is nondistended, soft and nontender. No organomegaly or masses felt. Normal bowel sounds heard. Central nervous system: Alert and oriented. No focal neurological deficits. Extremities: 1+ pitting edema  skin: No rashes, lesions or ulcers Psychiatry: Judgement and insight appear normal. Mood & affect appropriate.     Data Reviewed: I have personally reviewed following labs and imaging studies  CBC: Recent Labs  Lab  12/17/20 0425 12/20/20 0704 12/21/20 0644 12/22/20 1149 12/23/20 0510  WBC 20.1* 5.6 4.4 3.2* 4.8  NEUTROABS 17.2*  --   --   --   --   HGB 9.5* 8.9* 8.6* 8.4* 8.6*  HCT 31.4* 26.6* 27.4* 26.7* 28.2*  MCV 103.3* 95.3 100.0 100.8* 101.1*  PLT 147* PLATELET CLUMPS NOTED ON SMEAR, UNABLE TO ESTIMATE 111* 102* 263   Basic Metabolic Panel: Recent Labs  Lab 12/18/20 0604 12/19/20 0552 12/20/20 0704 12/21/20 0644 12/22/20 1149 12/23/20 0510  NA 130* 127* 124* 123* 127* 128*  K 4.7 4.2 5.3* 5.2* 5.1 5.0  CL 96* 95* 93* 91* 95* 95*  CO2 15* 18* 20* 20* 22 22  GLUCOSE 86 97 281* 227* 309* 345*  BUN 51* 57* 62* 71* 85* 81*  CREATININE 2.52* 2.25* 2.12* 2.46* 2.74* 2.56*  CALCIUM 8.4* 8.1* 8.3* 8.8* 9.0 9.4  MG 1.9  --   --   --   --   --   PHOS 6.1*  --   --   --   --   --    GFR: Estimated Creatinine Clearance: 21.3 mL/min (A) (by C-G formula based on SCr of 2.56 mg/dL (H)). Liver Function Tests: Recent Labs  Lab 12/17/20 0425 12/18/20 0604 12/19/20 0552 12/22/20 1149 12/23/20 0510  AST 17 15 13* 13* 16  ALT 12 11 10 12 15   ALKPHOS 58 50 50 63 67  BILITOT 2.2* 1.3* 1.1 0.7 0.8  PROT 6.7 6.7 6.8 6.8 7.0  ALBUMIN 2.8* 2.8* 2.6* 2.7* 2.9*   No results for input(s): LIPASE, AMYLASE in the last 168 hours. No results for input(s): AMMONIA in the last 168 hours. Coagulation Profile: No results for input(s): INR, PROTIME in the last 168 hours. Cardiac Enzymes: No results for input(s): CKTOTAL, CKMB, CKMBINDEX, TROPONINI in the last 168 hours. BNP (last 3 results) No results for input(s): PROBNP in the last 8760 hours. HbA1C: No results for input(s): HGBA1C in the last 72 hours. CBG: Recent Labs  Lab 12/22/20 0410 12/22/20 0730 12/22/20 1231 12/23/20 0808 12/23/20 1130  GLUCAP 275* 248* 306* 301* 454*   Lipid Profile: No results for input(s): CHOL, HDL, LDLCALC, TRIG, CHOLHDL, LDLDIRECT in the last 72 hours. Thyroid Function Tests: No results for input(s): TSH,  T4TOTAL, FREET4, T3FREE, THYROIDAB in the last 72 hours. Anemia Panel: No results for input(s): VITAMINB12, FOLATE, FERRITIN, TIBC,  IRON, RETICCTPCT in the last 72 hours. Sepsis Labs: Recent Labs  Lab 12/16/20 1515 12/16/20 1758  LATICACIDVEN 0.8 0.7    Recent Results (from the past 240 hour(s))  Culture, blood (Routine x 2)     Status: None   Collection Time: 12/14/20 10:42 PM   Specimen: Right Antecubital; Blood  Result Value Ref Range Status   Specimen Description   Final    RIGHT ANTECUBITAL Performed at [facility] [address], [place], [place] [address]    Special Requests   Final    BOTTLES DRAWN AEROBIC AND ANAEROBIC Blood Culture adequate volume Performed at [facility] [address] [place], [place] [address]    Culture   Final    NO GROWTH 5 DAYS Performed at [facility], [place] [address], [place], [place] [address]    Report Status 12/20/2020 FINAL  Final  Culture, blood (Routine x 2)     Status: None   Collection Time: 12/14/20 10:47 PM   Specimen: BLOOD RIGHT FOREARM  Result Value Ref Range Status   Specimen Description   Final    BLOOD RIGHT FOREARM Performed at [facility] [address] [place], [place] [address]    Special Requests   Final    BOTTLES DRAWN AEROBIC AND ANAEROBIC Blood Culture results may not be optimal due to an inadequate volume of blood received in culture bottles Performed at [facility] [address], [place], [place] [address]    Culture   Final    NO GROWTH 5 DAYS Performed at [facility], [place] [address], [place], [place] [address]    Report Status 12/20/2020 FINAL  Final  SARS CORONAVIRUS 2 (TAT 6-24 HRS) Nasopharyngeal Nasopharyngeal Swab     Status: None   Collection Time: 12/14/20 11:36 PM   Specimen: Nasopharyngeal Swab  Result Value Ref Range Status   SARS Coronavirus 2 NEGATIVE NEGATIVE Final    Comment:  (NOTE) SARS-CoV-2 target nucleic acids are NOT DETECTED.  The SARS-CoV-2 RNA is generally detectable in upper and lower respiratory specimens during the acute phase of infection. Negative results do not preclude SARS-CoV-2 infection, do not rule out co-infections with other pathogens, and should not be used as the sole basis for treatment or other patient management decisions. Negative results must be combined with clinical observations, patient history, and epidemiological information. The expected result is Negative.  Fact Sheet for Patients: SugarRoll.be  Fact Sheet for Healthcare Providers: https://www.[employer]ier.com/  This test is not yet approved or cleared by the [place] FDA and  has been authorized for detection and/or diagnosis of SARS-CoV-2 by FDA under an Emergency Use Authorization (EUA). This EUA will remain  in effect (meaning this test can be used) for the duration of the COVID-19 declaration under Se ction 564(b)(1) of the Act, 21 U.S.C. section 360bbb-3(b)(1), unless the authorization is terminated or revoked sooner.  Performed at [facility], [place] [address], [place], [place] [address]          Radiology Studies: No results found.      Scheduled Meds: . allopurinol  100 mg Oral Daily  . arformoterol  15 mcg Nebulization BID   And  . umeclidinium bromide  1 puff Inhalation Daily   And  . budesonide (PULMICORT) nebulizer solution  0.5 mg Nebulization BID  . brimonidine  1 drop Both Eyes BID  . cycloSPORINE  1 drop Both Eyes BID  .  dorzolamide  1 drop Both Eyes Daily  . ferrous sulfate  325 mg Oral Q breakfast  . FLUoxetine  40 mg Oral Daily  . furosemide  40 mg Intravenous Daily  . furosemide  40 mg Intravenous Once  . gabapentin  200 mg Oral BID  . guaiFENesin  600 mg Oral BID  . insulin aspart  0-5 Units Subcutaneous QHS  . insulin aspart  0-9 Units Subcutaneous TID WC  . insulin  glargine  10 Units Subcutaneous Daily  . isosorbide mononitrate  30 mg Oral Daily  . latanoprost  1 drop Both Eyes QHS  . magnesium oxide  400 mg Oral BID  . methylPREDNISolone (SOLU-MEDROL) injection  60 mg Intravenous Q6H  . mycophenolate  180 mg Oral BID  . pantoprazole  40 mg Oral BID  . saccharomyces boulardii  250 mg Oral BID  . tacrolimus  1 mg Oral BID  . valACYclovir  500 mg Oral BID  . cyanocobalamin  1,000 mcg Oral Daily   Continuous Infusions: . ampicillin-sulbactam (UNASYN) IV 3 g (12/23/20 1248)  . diltiazem (CARDIZEM) infusion 15 mg/hr (12/23/20 0925)  . heparin 1,000 Units/hr (12/23/20 1340)     LOS: 7 days     Georgette Shell, MD 12/23/2020, 1:55 PM

## 2020-12-23 NOTE — Discharge Instructions (Addendum)
Wet to Dry WOUND CARE: - Change dressing twice daily - Supplies: sterile saline, kerlex, scissors, ABD pads, tape  1. Remove dressing and all packing carefully, moistening with sterile saline (use Dakins until 01/17/21 then return to normal saline) as needed to avoid packing/internal dressing sticking to the wound. 2.   Clean edges of skin around the wound with water/gauze, making sure there is no tape debris or leakage left on skin that could cause skin irritation or breakdown. 3.   Dampen and clean kerlex with sterile saline and pack wound from wound base to skin level, making sure to take note of any possible areas of wound tracking, tunneling and packing appropriately. Wound can be packed loosely. Trim kerlex to size if a whole kerlex is not required. 4.   Cover wound with a dry ABD pad and secure with tape.  5.   Write the date/time on the dry dressing/tape to better track when the last dressing change occurred. - apply any skin protectant/powder if recommended by clinician to protect skin/skin folds. - change dressing as needed if leakage occurs, wound gets contaminated, or patient requests to shower. - You may shower daily with wound open and following the shower the wound should be dried and a clean dressing placed.  - Medical grade tape as well as packing supplies can be found at [employer] on [address] or [employer] on [address]. The remaining supplies can be found at your local drug store, [employer] etc.  [facility], [place] [phone]  OPEN ABDOMINAL SURGERY: POST OP INSTRUCTIONS  Always review your discharge instruction sheet given to you by the facility where your surgery was performed.  IF YOU HAVE DISABILITY OR FAMILY LEAVE FORMS, YOU MUST BRING THEM TO THE OFFICE FOR PROCESSING.  PLEASE DO NOT GIVE THEM TO YOUR DOCTOR.  1. A prescription for pain medication may be given to you upon discharge.  Take your pain medication as  prescribed, if needed.  If narcotic pain medicine is not needed, then you may take acetaminophen (Tylenol) or ibuprofen (Advil) as needed. 2. Take your usually prescribed medications unless otherwise directed. 3. If you need a refill on your pain medication, please contact your pharmacy. They will contact our office to request authorization.  Prescriptions will not be filled after 5pm or on week-ends. 4. You should follow a light diet the first few days after arrival home, such as soup and crackers, pudding, etc.unless your doctor has advised otherwise. A high-fiber, low fat diet can be resumed as tolerated.   Be sure to include lots of fluids daily. Most patients will experience some swelling and bruising on the chest and neck area.  Ice packs will help.  Swelling and bruising can take several days to resolve 5. Most patients will experience some swelling and bruising in the area of the incision. Ice pack will help. Swelling and bruising can take several days to resolve..  6. It is common to experience some constipation if taking pain medication after surgery.  Increasing fluid intake and taking a stool softener will usually help or prevent this problem from occurring.  A mild laxative (Milk of Magnesia or Miralax) should be taken according to package directions if there are no bowel movements after 48 hours. 7.  ACTIVITIES:  You may resume regular (light) daily activities beginning the next day--such as daily self-care, walking, climbing stairs--gradually increasing activities as tolerated.  You may have sexual intercourse when it is  comfortable.  Refrain from any heavy lifting or straining until approved by your doctor. a. You may drive when you no longer are taking prescription pain medication, you can comfortably wear a seatbelt, and you can safely maneuver your car and apply brakes 8. You should see your doctor in the office for a follow-up appointment approximately two weeks after your surgery.  Make  sure that you call for this appointment within a day or two after you arrive home to insure a convenient appointment time.  WHEN TO CALL YOUR DOCTOR: 1. Fever over 101.0 2. Inability to urinate 3. Nausea and/or vomiting 4. Extreme swelling or bruising 5. Continued bleeding from incision. 6. Increased pain, redness, or drainage from the incision. 7. Difficulty swallowing or breathing 8. Muscle cramping or spasms. 9. Numbness or tingling in hands or feet or around lips.  The clinic staff is available to answer your questions during regular business hours.  Please don't hesitate to call and ask to speak to one of the nurses if you have concerns.  For further questions, please visit [email or web address]      Information on my medicine - ELIQUIS (apixaban)  This medication education was reviewed with me or my healthcare representative as part of my discharge preparation.   Why was Eliquis prescribed for you? Eliquis was prescribed for you to reduce the risk of a blood clot forming that can cause a stroke if you have a medical condition called atrial fibrillation (a type of irregular heartbeat).  What do You need to know about Eliquis ? Take your Eliquis TWICE DAILY - one tablet in the morning and one tablet in the evening with or without food. If you have difficulty swallowing the tablet whole please discuss with your pharmacist how to take the medication safely.  Take Eliquis exactly as prescribed by your doctor and DO NOT stop taking Eliquis without talking to the doctor who prescribed the medication.  Stopping may increase your risk of developing a stroke.  Refill your prescription before you run out.  After discharge, you should have regular check-up appointments with your healthcare provider that is prescribing your Eliquis.  In the future your dose may need to be changed if your kidney function or weight changes by a significant amount or as you get older.  What  do you do if you miss a dose? If you miss a dose, take it as soon as you remember on the same day and resume taking twice daily.  Do not take more than one dose of ELIQUIS at the same time to make up a missed dose.  Important Safety Information A possible side effect of Eliquis is bleeding. You should call your healthcare provider right away if you experience any of the following: ? Bleeding from an injury or your nose that does not stop. ? Unusual colored urine (red or dark brown) or unusual colored stools (red or black). ? Unusual bruising for unknown reasons. ? A serious fall or if you hit your head (even if there is no bleeding).  Some medicines may interact with Eliquis and might increase your risk of bleeding or clotting while on Eliquis. To help avoid this, consult your healthcare provider or pharmacist prior to using any new prescription or non-prescription medications, including herbals, vitamins, non-steroidal anti-inflammatory drugs (NSAIDs) and supplements.  This website has more information on Eliquis (apixaban): http://[email or web address].com/eliquis/homeLow-Fiber Eating Plan Fiber is found in fruits, vegetables, whole grains, and beans. Eating a diet low in fiber helps to reduce how  often you have bowel movements and the amount of stool you produce. A low-fiber eating plan may help your digestive system heal if you:  Have certain conditions, such as Crohn's disease, diverticulitis, or irritable bowel syndrome (IBS), and are having a flare-up.  Recently had radiation therapy on your pelvis or bowel.  Recently had intestinal surgery.  Have a new surgical opening in your abdomen (colostomy or ileostomy).  Have an intestine that has narrowed (stricture). Your health care provider will tell you how long to stay on this diet and may recommend that you work with a dietitian. What are tips for following this plan? Reading food labels  Check the nutrition facts label on food products  for the amount of dietary fiber.  Choose foods that have less than 2 grams (g) of fiber per serving.   Cooking  Use white flour for baking and cooking.  Cook meat using methods that keep it tender, such as braising or poaching.  Cook eggs until the yolk is completely solid.  Cook with healthy oils, such as olive oil or canola oil. Meal planning  Eat 5-6 small meals throughout the day instead of 3 large meals.  If you are lactose intolerant: ? Choose low-lactose dairy foods. ? Do not eat dairy foods if told by your health care provider or dietitian.  Limit fats and oils to less than 8 teaspoons (39 mL) a day.  Eat small portions of desserts.  Limit acidic, spicy, or fried foods to reduce gas, bloating, and discomfort. General information  Follow instructions from your health care provider or dietitian about how much fiber you should have each day.  Most people on a low-fiber eating plan should eat less than 10 g of fiber a day. Your daily fiber goal is _________________ g.  Take vitamin and mineral supplements as told by your health care provider or dietitian. Chewable or liquid forms are best when on this eating plan. A gummy vitamin is not recommended. What foods should I eat? Fruits Soft-cooked or canned fruits without skin and seeds. Ripe banana. Applesauce. Fruit juice without pulp. Vegetables Well-cooked or canned vegetables without skin, seeds, or stems. Cooked potatoes without skins. Vegetable juice. Grains All bread and crackers made with white flour. Waffles, pancakes, and [place] toast. Bagels. Pretzels. Melba toast, zwieback, and matzoh. Cooked and dried cereals that do not have whole grains, added fiber, seeds, or dried fruit. Domenick Gong. Hot and cold cereals made with refined corn, rice, or oats. Plain pasta and noodles. White rice. Meats and other proteins Ground meat. Tender cuts of meat or poultry. Eggs. Fish, seafood, and shellfish. Smooth nut butters.  Tofu. Dairy All milk products and drinks. Lactose-free milk, including rice, soy, and almond milk. Yogurt without fruit, nuts, chocolate, or granola mixed in. Sour cream. Cottage cheese. Cheese. Fats and oils Olive oil, canola oil, sunflower oil, flaxseed oil, avocado oil, and grapeseed oil. Mayonnaise. Cream cheese. Margarine. Butter. Beverages Decaf coffee. Fruit and vegetable juices. Smoothies (in small amounts, with no pulp or skins, and with fruits from the recommended list). Sports drinks. Herbal tea. Water. Sweets and desserts Plain cakes. Cookies. Cream pies and pies made with recommended fruits. Pudding. Custard. Fruit gelatin. Sherbet. Ice pops. Ice cream without nuts. Hard candy. Honey. Jelly. Molasses. Syrups. Chocolate. Marshmallows. Gumdrops. Seasonings and condiments Ketchup. Mild mustard. Mild salad dressings. Plain gravies. Vinegar. Spices in moderation. Salt. Sugar. Other foods Bouillon. Broth. Cream and strained soups made from recommended foods. Casseroles made with recommended foods. The items  listed above may not be a complete list of foods and beverages you can eat. Contact a dietitian for more information. What foods should I avoid? Fruits Raw or dried fruit. Berries. Fruit juice with pulp. Prune juice. Vegetables Potato skins. Raw or undercooked vegetables. All beans and bean sprouts. Cooked greens. Corn. Peas. Cabbage. Beets. Broccoli. Brussels sprouts. Cauliflower. Mushrooms. Onions. Peppers. Parsnips. Okra. Sauerkraut. Grains Whole-wheat, whole-grain, or multigrain breads, cereals, or crackers. Rye bread. Cereals with nuts, raisins, or coconut. Bran. Granola. High-fiber cereals. Cornmeal or corn bread. Whole-grain pasta. Wild or brown rice. Quinoa. Popcorn. Buckwheat. Wheat germ. Meats and other proteins Tough, fibrous meats with gristle. Fatty meat. Poultry with skin. Fried meat, Sales executive, or fish. Precooked or cured meat, such as sausages or meat loaves. Berniece Salines. Hot  dogs. Nuts and chunky nut butter. Dried peas, beans, and lentils. Hummus. Dairy Yogurt with fruit, nuts, chocolate, or granola mixed in. Full-fat dairy such as whole milk, ice cream, or sour cream. Beverages Caffeinated coffee and teas. Fats and oils Avocado. Coconut. Butter. Sweets and desserts Desserts, cookies, or candies that contain nuts or coconut. Dried fruit. Jams and preserves with seeds. Marmalade. Any dessert made with fruits or grains that are not recommended. Seasonings and condiments Relish. Horseradish. Angie Fava. Olives. Other foods Corn tortilla chips. Soups made with vegetables or grains that are not recommended. The items listed above may not be a complete list of foods and beverages you should avoid. Contact a dietitian for more information. Summary  Most people on a low-fiber eating plan should eat less than 10 grams of fiber a day. Follow recommendations from your health care provider or dietitian about how much fiber you should have each day.  Always check nutrition facts labels to see the dietary fiber amount in packaged foods. A low-fiber food will have less than 2 grams of fiber per serving.  Try to avoid whole grains, raw fruits and vegetables, dried fruit, tough cuts of meat, nuts, and seeds.  Take a vitamin and mineral supplement as told by your health care provider or dietitian. This information is not intended to replace advice given to you by your health care provider. Make sure you discuss any questions you have with your health care provider. Document Revised: 03/14/2020 Document Reviewed: 03/14/2020 Elsevier Patient Education  2021 [employer].

## 2020-12-24 DIAGNOSIS — R0602 Shortness of breath: Secondary | ICD-10-CM | POA: Diagnosis not present

## 2020-12-24 DIAGNOSIS — R1114 Bilious vomiting: Secondary | ICD-10-CM | POA: Diagnosis not present

## 2020-12-24 DIAGNOSIS — I483 Typical atrial flutter: Secondary | ICD-10-CM | POA: Diagnosis not present

## 2020-12-24 DIAGNOSIS — K5792 Diverticulitis of intestine, part unspecified, without perforation or abscess without bleeding: Secondary | ICD-10-CM | POA: Diagnosis not present

## 2020-12-24 DIAGNOSIS — J9621 Acute and chronic respiratory failure with hypoxia: Secondary | ICD-10-CM | POA: Diagnosis not present

## 2020-12-24 DIAGNOSIS — I5033 Acute on chronic diastolic (congestive) heart failure: Secondary | ICD-10-CM | POA: Diagnosis not present

## 2020-12-24 LAB — CBC
HCT: 28 % — ABNORMAL LOW (ref 36.0–46.0)
Hemoglobin: 8.6 g/dL — ABNORMAL LOW (ref 12.0–15.0)
MCH: 31.4 pg (ref 26.0–34.0)
MCHC: 30.7 g/dL (ref 30.0–36.0)
MCV: 102.2 fL — ABNORMAL HIGH (ref 80.0–100.0)
Platelets: 155 10*3/uL (ref 150–400)
RBC: 2.74 MIL/uL — ABNORMAL LOW (ref 3.87–5.11)
RDW: 17.6 % — ABNORMAL HIGH (ref 11.5–15.5)
WBC: 4.7 10*3/uL (ref 4.0–10.5)
nRBC: 0.4 % — ABNORMAL HIGH (ref 0.0–0.2)

## 2020-12-24 LAB — COMPREHENSIVE METABOLIC PANEL
ALT: 14 U/L (ref 0–44)
AST: 13 U/L — ABNORMAL LOW (ref 15–41)
Albumin: 2.8 g/dL — ABNORMAL LOW (ref 3.5–5.0)
Alkaline Phosphatase: 59 U/L (ref 38–126)
Anion gap: 12 (ref 5–15)
BUN: 89 mg/dL — ABNORMAL HIGH (ref 8–23)
CO2: 21 mmol/L — ABNORMAL LOW (ref 22–32)
Calcium: 9.8 mg/dL (ref 8.9–10.3)
Chloride: 98 mmol/L (ref 98–111)
Creatinine, Ser: 2.52 mg/dL — ABNORMAL HIGH (ref 0.44–1.00)
GFR, Estimated: 21 mL/min — ABNORMAL LOW (ref >60)
Glucose, Bld: 230 mg/dL — ABNORMAL HIGH (ref 70–99)
Potassium: 4.7 mmol/L (ref 3.5–5.1)
Sodium: 131 mmol/L — ABNORMAL LOW (ref 135–145)
Total Bilirubin: 0.9 mg/dL (ref 0.3–1.2)
Total Protein: 6.5 g/dL (ref 6.5–8.1)

## 2020-12-24 LAB — GLUCOSE, CAPILLARY
Glucose-Capillary: 216 mg/dL — ABNORMAL HIGH (ref 70–99)
Glucose-Capillary: 218 mg/dL — ABNORMAL HIGH (ref 70–99)
Glucose-Capillary: 255 mg/dL — ABNORMAL HIGH (ref 70–99)
Glucose-Capillary: 267 mg/dL — ABNORMAL HIGH (ref 70–99)
Glucose-Capillary: 311 mg/dL — ABNORMAL HIGH (ref 70–99)

## 2020-12-24 LAB — BRAIN NATRIURETIC PEPTIDE: B Natriuretic Peptide: 1624.9 pg/mL — ABNORMAL HIGH (ref 0.0–100.0)

## 2020-12-24 MED ORDER — ALBUMIN HUMAN 25 % IV SOLN
12.5000 g | Freq: Once | INTRAVENOUS | Status: AC
  Administered 2020-12-24: 12.5 g via INTRAVENOUS
  Filled 2020-12-24: qty 50

## 2020-12-24 MED ORDER — METOPROLOL TARTRATE 50 MG PO TABS
50.0000 mg | ORAL_TABLET | Freq: Two times a day (BID) | ORAL | Status: DC
  Administered 2020-12-24 – 2020-12-26 (×3): 50 mg via ORAL
  Filled 2020-12-24 (×3): qty 2
  Filled 2020-12-24: qty 1
  Filled 2020-12-24: qty 2

## 2020-12-24 MED ORDER — METOPROLOL TARTRATE 25 MG PO TABS
25.0000 mg | ORAL_TABLET | Freq: Once | ORAL | Status: AC
  Administered 2020-12-24: 25 mg via ORAL
  Filled 2020-12-24: qty 1

## 2020-12-24 MED ORDER — FUROSEMIDE 10 MG/ML IJ SOLN: 40.0000 mg | Freq: Once | INTRAMUSCULAR | Status: DC

## 2020-12-24 MED ORDER — HYDRALAZINE HCL 25 MG PO TABS
25.0000 mg | ORAL_TABLET | Freq: Three times a day (TID) | ORAL | Status: DC
  Administered 2020-12-24 – 2020-12-26 (×5): 25 mg via ORAL
  Filled 2020-12-24 (×7): qty 1

## 2020-12-24 NOTE — Progress Notes (Signed)
   TEE/DCCV rescheduled to 12/25/20 at 12:30pm with Dr. Audie Box due to a change in schedules. Patient updated to plan. Will make NPO after MN tonight.   Abigail Butts, PA-C 12/24/20; 1:46 PM

## 2020-12-24 NOTE — Progress Notes (Signed)
Patient ID: April [name], female   DOB: 07-Apr-1956, 65 y.o.   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  Her Scr was 1.3 on admission but rose to 2.52 and we were consulted on 12/18/20.  Please see Dr. Barkley Bruns consult note for full details.  Her AKI/CKD stage 3a was felt to be related to ischemic ATN in setting of volume depletion and hypotension.  Her Scr improved to 2.1 after IVF's and our service signed off on 12/20/20.  Her hospital course was further complicated by atrial flutter with RVR and hypotension.  Unfortunately her Scr started to climb after recurrent hypotension as well as being started on IV lasix for acute on chronic diastolic CHF.  We were asked to come back and re-evaluate.  Please see trend in Scr below.  O:BP (!) 160/74   Pulse (!) 109   Temp 97.6 F (36.4 C) (Axillary)   Resp 17   Ht 5\' 1"  (1.549 m)   Wt 78.5 kg   SpO2 99%   BMI 32.70 kg/m   Intake/Output Summary (Last 24 hours) at 12/24/2020 1216 Last data filed at 12/24/2020 8299 Gross per 24 hour  Intake 878.43 ml  Output 2075 ml  Net -1196.57 ml   Intake/Output: I/O last 3 completed shifts: In: 1276.6 [P.O.:480; I.V.:596.6; IV Piggyback:200] Out: 2075 [Urine:2075]  Intake/Output this shift:  No intake/output data recorded. Weight change: -0.7 kg Gen: Frail, elderly and ill-appearing female in NAD CVS: tachy at 109 Resp: diminished BS bilaterally and poor inspiratory effort. Abd:+BS, soft, NT Ext: 1+ edema of hands and legs  Recent Labs  Lab 12/18/20 0604 12/19/20 0552 12/20/20 0704 12/21/20 0644 12/22/20 1149 12/23/20 0510 12/24/20 0209  NA 130* 127* 124* 123* 127* 128* 131*  K 4.7 4.2 5.3* 5.2* 5.1 5.0 4.7  CL 96* 95* 93* 91* 95* 95* 98  CO2 15* 18* 20* 20* 22 22 21*  GLUCOSE 86 97 281* 227* 309* 345* 230*   BUN 51* 57* 62* 71* 85* 81* 89*  CREATININE 2.52* 2.25* 2.12* 2.46* 2.74* 2.56* 2.52*  ALBUMIN 2.8* 2.6*  --   --  2.7* 2.9* 2.8*  CALCIUM 8.4* 8.1* 8.3* 8.8* 9.0 9.4 9.8  PHOS 6.1*  --   --   --   --   --   --   AST 15 13*  --   --  13* 16 13*  ALT 11 10  --   --  12 15 14    Liver Function Tests: Recent Labs  Lab 12/22/20 1149 12/23/20 0510 12/24/20 0209  AST 13* 16 13*  ALT 12 15 14   ALKPHOS 63 67 59  BILITOT 0.7 0.8 0.9  PROT 6.8 7.0 6.5  ALBUMIN 2.7* 2.9* 2.8*   No results for input(s): LIPASE, AMYLASE in the last 168 hours. No results for input(s): AMMONIA in the last 168 hours. CBC: Recent Labs  Lab 12/20/20 0704 12/21/20 0644 12/22/20 1149 12/23/20 0510 12/24/20 0209  WBC 5.6 4.4 3.2* 4.8 4.7  HGB 8.9* 8.6* 8.4* 8.6* 8.6*  HCT 26.6* 27.4* 26.7* 28.2* 28.0*  MCV 95.3 100.0 100.8* 101.1* 102.2*  PLT PLATELET CLUMPS NOTED ON SMEAR, UNABLE TO ESTIMATE 111* 102* 152 155   Cardiac Enzymes: No results for input(s): CKTOTAL, CKMB, CKMBINDEX, TROPONINI in the last 168 hours. CBG: Recent  Labs  Lab 12/23/20 1447 12/23/20 1726 12/23/20 2127 12/24/20 0749 12/24/20 1147  GLUCAP 377* 291* 340* 216* 218*    Iron Studies: No results for input(s): IRON, TIBC, TRANSFERRIN, FERRITIN in the last 72 hours. Studies/Results: [place] EKG SITE RITE  Result Date: 12/23/2020 If Site Rite image not attached, placement could not be confirmed due to current cardiac rhythm.  [name] allopurinol  100 mg Oral Daily  . amoxicillin-clavulanate  1 tablet Oral BID  . apixaban  5 mg Oral BID  . arformoterol  15 mcg Nebulization BID   And  . umeclidinium bromide  1 puff Inhalation Daily   And  . budesonide (PULMICORT) nebulizer solution  0.5 mg Nebulization BID  . brimonidine  1 drop Both Eyes BID  . Chlorhexidine Gluconate Cloth  6 each Topical Daily  . cycloSPORINE  1 drop Both Eyes BID  . dorzolamide  1 drop Both Eyes Daily  . ferrous sulfate  325 mg Oral Q breakfast  . FLUoxetine  40  mg Oral Daily  . furosemide  40 mg Intravenous Daily  . furosemide  40 mg Intravenous Once  . gabapentin  200 mg Oral BID  . guaiFENesin  600 mg Oral BID  . hydrALAZINE  25 mg Oral Q8H  . insulin aspart  0-5 Units Subcutaneous QHS  . insulin aspart  0-9 Units Subcutaneous TID WC  . insulin aspart  5 Units Subcutaneous TID WC  . insulin glargine  10 Units Subcutaneous BID  . isosorbide mononitrate  30 mg Oral Daily  . latanoprost  1 drop Both Eyes QHS  . magnesium oxide  400 mg Oral BID  . mouth rinse  15 mL Mouth Rinse BID  . methylPREDNISolone (SOLU-MEDROL) injection  60 mg Intravenous Q12H  . metoprolol tartrate  25 mg Oral Once  . metoprolol tartrate  50 mg Oral BID  . mycophenolate  180 mg Oral BID  . pantoprazole  40 mg Oral BID  . saccharomyces boulardii  250 mg Oral BID  . tacrolimus  1 mg Oral BID  . valACYclovir  500 mg Oral BID  . cyanocobalamin  1,000 mcg Oral Daily    BMET    Component Value Date/Time   NA 131 (L) 12/24/2020 0209   K 4.7 12/24/2020 0209   CL 98 12/24/2020 0209   CO2 21 (L) 12/24/2020 0209   GLUCOSE 230 (H) 12/24/2020 0209   BUN 89 (H) 12/24/2020 0209   CREATININE 2.52 (H) 12/24/2020 0209   CREATININE 1.28 (H) 11/04/2020 0952   CALCIUM 9.8 12/24/2020 0209   CALCIUM 10.8 (H) 10/13/2011 1405   GFRNONAA 21 (L) 12/24/2020 0209   GFRNONAA 47 (L) 11/04/2020 0952   GFRAA 51 (L) 08/05/2020 1523   CBC    Component Value Date/Time   WBC 4.7 12/24/2020 0209   RBC 2.74 (L) 12/24/2020 0209   HGB 8.6 (L) 12/24/2020 0209   HGB 9.0 (L) 11/04/2020 0952   HGB 10.6 (L) 12/29/2010 1522   HCT 28.0 (L) 12/24/2020 0209   HCT 31.2 (L) 12/29/2010 1522   PLT 155 12/24/2020 0209   PLT 241 11/04/2020 0952   PLT 205 12/29/2010 1522   MCV 102.2 (H) 12/24/2020 0209   MCV 94 12/29/2010 1522   MCH 31.4 12/24/2020 0209   MCHC 30.7 12/24/2020 0209   RDW 17.6 (H) 12/24/2020 0209   RDW 12.8 12/29/2010 1522   LYMPHSABS 1.7 12/17/2020 0425   LYMPHSABS 1.1 12/29/2010  1522   MONOABS 1.0 12/17/2020 0425  EOSABS 0.1 12/17/2020 0425   EOSABS 0.2 12/29/2010 1522   BASOSABS 0.0 12/17/2020 0425   BASOSABS 0.0 12/29/2010 1522     Assessment/Plan:  1. AKI/CKD stage 3a- multifactorial, in setting of intravascular volume depletion from poor po intake as well as N/V as well as hypotension from sepsis/hypoperfusion and 3rd spacing from acute on chronic diastolic CHF.  Her Scr was improving until IV lasix was started on 12/21/20.  Scr peaked at 2.71 but has slowly been improving.  Difficult situation as she does have peripheral edema but remains hypotensive and likely intravascularly depleted.  Will give dose of IV albumin and follow.  Would limit goal diuresis of 1 liter negative per day as BP tolerates. 2. Atrial flutter with RVR- started on heparin gtt and possible TEE/DCCV during this admission.  Cardiology following and pt on diltiazem drip. 3. Hypotension- initially improved but now with RVR has dropped BP's again.  Continue to follow 4. Acute colitis- improving with abx.  Surgery following.  5. Acute on chronic diastolic CHF- responded to IV lasix.  Hold further dosing for now and will give IV albumin to help with 3rd spacing. 6. S/p kidney transplant-  Continue prograf/pred/myfortic.  Check prograf level 7. Hyponatremia- due to CHF/edema.  Improved with IV lasix. 8. COPD- per PCCM 9. Deconditioning- very weak and will likely require PT/OT evaluation when stable.   Donetta Potts, MD [employer] [phone]

## 2020-12-24 NOTE — H&P (View-Only) (Signed)
NAME:  April [name], MRN:  [id number], DOB:  January 16, 1956, LOS: 8 ADMISSION DATE:  12/16/2020, CONSULTATION DATE:  12/23/20 REFERRING MD:  Rodena Piety, CHIEF COMPLAINT:  Afib with RVR, poor IV access   Brief History:  H/o renal transplant, admitted with acute diverticulitis, new onset Afib with RVR, poor IV access on the floor and concern for decompensation, requesting CVC  History of Present Illness:  April [name] is a 65 year old woman admitted to the hospital on 1/24 with failed outpatient treatment for diverticulitis due to intractable nausea and vomiting.  Since admission she has developed A. fib with RVR.  Cardiology following, planning for TEE with cardioversion tomorrow.  Heart rate has remained greater than 140 for the past 24 hours.  So far improving on IV antibiotics- resolved leukocytosis and fevers.  Surgery recommending transition to oral antimicrobials to complete her course.  PCCM notified of poor IV access and requested to place central line.  Not a PICC candidate due to potential need for dialysis again in the future.  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  Normal RV.  Normal valves.  Micro Data:    Antimicrobials:  unasyn 1/25- 1/31 Ciprofloxacin 1/24 Flagyl 1/24off Zosyn 1/24off 12/23/2020 Augmentin 12/23/2020 Valtrex Interim History / Subjective:  Intermittently requiring BiPAP for anxiety  Objective   Blood pressure (!) 148/71, pulse (!) 105, temperature 97.6 F (36.4 C), temperature source Axillary, resp. rate 17, height 5\' 1"  (1.549 m), weight 78.5 kg, SpO2 99 %.    FiO2 (%):  [30 %] 30 %   Intake/Output Summary (Last 24 hours) at 12/24/2020 0953 Last data filed at 12/24/2020  9485 Gross per 24 hour  Intake 878.43 ml  Output 2075 ml  Net -1196.57 ml   Filed Weights   12/23/20 0600 12/23/20 1708 12/24/20 0500  Weight: 78.5 kg 77.8 kg 78.5 kg    Examination: General: Nourished female no acute distress at rest  HEENT: Currently on BiPAP mask no JVD is appreciated Neuro: Grossly intact without focal defect, very nervous CV: Heart sounds are regular atrial fibrillation PULM: Noted diminished breath sounds throughout GI: soft, bsx4 active  GU: Voids Extremities: Bilateral hands with ecchymosis, peripheral edema upper and lower extremities. Skin: Warm   Resolved Hospital Problem list     Assessment & Plan:   Afib with RVR, new onset likely due to hypervolemia, acute illness Currently on Eliquis 5 mg twice daily TEE with cardioversion is planned for 12/24/2020 per cardiology Currently on diltiazem drip Metroprolol 25 mg twice daily p.o. Continue aggressive diuresis    Acute hypercapneic, chronic hypoxic respiratory failure History of COPD High risk for OSA  Nocturnal and as needed noninvasive mechanical ventilatory support Oxygen as needed ABGs as needed Noted to be on anxiolytics which may lead to hypercarbia      AKI on CKD, history of end-stage renal disease due to polycystic kidney disease, status post transplant. AKI felt to be due to hypoperfusion Recent Labs  Lab 12/22/20 1149 12/23/20 0510 12/24/20 0209  K 5.1 5.0 4.7    Lab Results  Component Value Date   CREATININE 2.52 (H) 12/24/2020   CREATININE 2.56 (H) 12/23/2020   CREATININE 2.74 (H) 12/22/2020   CREATININE 1.28 (H) 11/04/2020   CREATININE 1.30 (H) 08/05/2020  CREATININE 1.07 (H) 06/10/2020  Immunosuppressants per nephrology Renal dose medications Diuresis as able Avoid nephrotoxins Avoid PICC line if possible   Sepsis due to acute diverticulitis Appreciate surgery's input Transition to oral antimicrobial therapy with Augmentin, and Flagyl and  ciprofloxacin Immunosuppressants discussed with pharmacy   -Per surgery okay to transition to oral medications.  Starting Augmentin; oral Flagyl and ciprofloxacin have DDI with her immunosuppressants- discussed with PharmD. Discontinue Unasyn. -Appreciate surgery's assistance.  Poor peripheral IV access Continue with peripheral IVs Transition medications to p.o. Try to avoid central line infection less likely twice daily stepdown environment within 24 to 48 hours     Hyperglycemia most likely steroid exacerbation CBG (last 3)  Recent Labs    12/23/20 1726 12/23/20 2127 12/24/20 0749  GLUCAP 291* 340* 216*    Per primary's notes steroids for heart failure-would like to aggressively de-escalate this.  Currently on Solu-Medrol 60 mg twice daily  Sliding-scale insulin protocol Insulin 5 units subcu 3 times a day with meals Lantus 10 units 2 times daily Further de-escalation of steroids recommended.   Best practice (evaluated daily)  Diet: NPO past midnight for TEE / DCCV Pain/Anxiety/Delirium protocol (if indicated): n/a VAP protocol (if indicated): n/a DVT prophylaxis: eliquis GI prophylaxis: pantoprazole Glucose control: basal bolus, SSI Mobility: bed rest Disposition:SD , she can transfer to progressive unit and PCCM available as needed.  Goals of Care:  Last date of multidisciplinary goals of care discussion: Family and staff present:  Summary of discussion:  Follow up goals of care discussion due:  Code Status: full  Labs   CBC: Recent Labs  Lab 12/20/20 0704 12/21/20 0644 12/22/20 1149 12/23/20 0510 12/24/20 0209  WBC 5.6 4.4 3.2* 4.8 4.7  HGB 8.9* 8.6* 8.4* 8.6* 8.6*  HCT 26.6* 27.4* 26.7* 28.2* 28.0*  MCV 95.3 100.0 100.8* 101.1* 102.2*  PLT PLATELET CLUMPS NOTED ON SMEAR, UNABLE TO ESTIMATE 111* 102* 152 654    Basic Metabolic Panel: Recent Labs  Lab 12/18/20 0604 12/19/20 0552 12/20/20 0704 12/21/20 0644 12/22/20 1149 12/23/20 0510  12/24/20 0209  NA 130*   < > 124* 123* 127* 128* 131*  K 4.7   < > 5.3* 5.2* 5.1 5.0 4.7  CL 96*   < > 93* 91* 95* 95* 98  CO2 15*   < > 20* 20* 22 22 21*  GLUCOSE 86   < > 281* 227* 309* 345* 230*  BUN 51*   < > 62* 71* 85* 81* 89*  CREATININE 2.52*   < > 2.12* 2.46* 2.74* 2.56* 2.52*  CALCIUM 8.4*   < > 8.3* 8.8* 9.0 9.4 9.8  MG 1.9  --   --   --   --   --   --   PHOS 6.1*  --   --   --   --   --   --    < > = values in this interval not displayed.   GFR: Estimated Creatinine Clearance: 21.7 mL/min (A) (by C-G formula based on SCr of 2.52 mg/dL (H)). Recent Labs  Lab 12/21/20 0644 12/22/20 1149 12/23/20 0510 12/24/20 0209  WBC 4.4 3.2* 4.8 4.7    Liver Function Tests: Recent Labs  Lab 12/18/20 0604 12/19/20 0552 12/22/20 1149 12/23/20 0510 12/24/20 0209  AST 15 13* 13* 16 13*  ALT 11 10 12 15 14   ALKPHOS 50 50 63 67 59  BILITOT 1.3* 1.1 0.7 0.8 0.9  PROT 6.7 6.8 6.8 7.0 6.5  ALBUMIN 2.8* 2.6*  2.7* 2.9* 2.8*   No results for input(s): LIPASE, AMYLASE in the last 168 hours. No results for input(s): AMMONIA in the last 168 hours.  ABG    Component Value Date/Time   PHART 7.264 (L) 12/23/2020 1602   PCO2ART 50.4 (H) 12/23/2020 1602   PO2ART 113 (H) 12/23/2020 1602   HCO3 22.1 12/23/2020 1602   TCO2 24 10/06/2020 2019   ACIDBASEDEF 4.3 (H) 12/23/2020 1602   O2SAT 98.4 12/23/2020 1602     Coagulation Profile: No results for input(s): INR, PROTIME in the last 168 hours.  Cardiac Enzymes: No results for input(s): CKTOTAL, CKMB, CKMBINDEX, TROPONINI in the last 168 hours.  HbA1C: Hgb A1c MFr Bld  Date/Time Value Ref Range Status  12/16/2020 09:02 AM 5.6 4.8 - 5.6 % Final    Comment:    (NOTE) Pre diabetes:          5.7%-6.4%  Diabetes:              >6.4%  Glycemic control for   <7.0% adults with diabetes   09/10/2020 02:47 PM 5.6 4.8 - 5.6 % Final    Comment:    (NOTE)         Prediabetes: 5.7 - 6.4         Diabetes: >6.4         Glycemic  control for adults with diabetes: <7.0     CBG: Recent Labs  Lab 12/23/20 1130 12/23/20 1447 12/23/20 1726 12/23/20 2127 12/24/20 0749  GLUCAP 454* 377* Glencoe Hollister Wessler ACNP Acute Care Nurse Practitioner [place] Please consult Amion 12/24/2020, 9:54 AM

## 2020-12-24 NOTE — Progress Notes (Signed)
[facility] set up to have pt to [facility] End[facility] for her TEE/cardioversion scheduled tomorrow 12/25/20 @ 1230. Pt's RN aware to send signed consent with pt for the procedure. Jobe Igo, RN

## 2020-12-24 NOTE — Progress Notes (Signed)
Progress Note  Patient Name: April [name] Date of Encounter: 12/24/2020  [facility] Cardiologist: Sanda Klein, MD   Subjective   Feeling okay this morning. Does not like the BiPAP mask. RN to page respiratory to transition to O2 via . No complaints of chest pain, palpitations, or SOB.   Inpatient Medications    Scheduled Meds: . allopurinol  100 mg Oral Daily  . amoxicillin-clavulanate  1 tablet Oral BID  . apixaban  5 mg Oral BID  . arformoterol  15 mcg Nebulization BID   And  . umeclidinium bromide  1 puff Inhalation Daily   And  . budesonide (PULMICORT) nebulizer solution  0.5 mg Nebulization BID  . brimonidine  1 drop Both Eyes BID  . Chlorhexidine Gluconate Cloth  6 each Topical Daily  . cycloSPORINE  1 drop Both Eyes BID  . dorzolamide  1 drop Both Eyes Daily  . ferrous sulfate  325 mg Oral Q breakfast  . FLUoxetine  40 mg Oral Daily  . furosemide  40 mg Intravenous Daily  . gabapentin  200 mg Oral BID  . guaiFENesin  600 mg Oral BID  . insulin aspart  0-5 Units Subcutaneous QHS  . insulin aspart  0-9 Units Subcutaneous TID WC  . insulin aspart  5 Units Subcutaneous TID WC  . insulin glargine  10 Units Subcutaneous BID  . isosorbide mononitrate  30 mg Oral Daily  . latanoprost  1 drop Both Eyes QHS  . magnesium oxide  400 mg Oral BID  . mouth rinse  15 mL Mouth Rinse BID  . methylPREDNISolone (SOLU-MEDROL) injection  60 mg Intravenous Q12H  . metoprolol tartrate  25 mg Oral BID  . mycophenolate  180 mg Oral BID  . pantoprazole  40 mg Oral BID  . saccharomyces boulardii  250 mg Oral BID  . tacrolimus  1 mg Oral BID  . valACYclovir  500 mg Oral BID  . cyanocobalamin  1,000 mcg Oral Daily   Continuous Infusions: . diltiazem (CARDIZEM) infusion 15 mg/hr (12/24/20 0611)   PRN Meds: acetaminophen **OR** acetaminophen, ALPRAZolam, bisacodyl, ipratropium-albuterol, metoprolol tartrate, ondansetron **OR** ondansetron (ZOFRAN) IV   Vital Signs     Vitals:   12/24/20 0316 12/24/20 0400 12/24/20 0500 12/24/20 0600  BP:  (!) 145/67 140/69 (!) 148/71  Pulse:  69 62 (!) 105  Resp: 12 12 12 17   Temp: (!) 97.5 F (36.4 C)     TempSrc: Axillary     SpO2:  99% 99% 99%  Weight:   78.5 kg   Height:        Intake/Output Summary (Last 24 hours) at 12/24/2020 0757 Last data filed at 12/24/2020 4098 Gross per 24 hour  Intake 878.43 ml  Output 2075 ml  Net -1196.57 ml   Last 3 Weights 12/24/2020 12/23/2020 12/23/2020  Weight (lbs) 173 lb 1 oz 171 lb 8.3 oz 173 lb 1 oz  Weight (kg) 78.5 kg 77.8 kg 78.5 kg      Telemetry    Atrial flutter with improvement in rate to 100s-110s - Personally Reviewed  ECG    No new tracings - Personally Reviewed  Physical Exam   GEN: Sitting upright in bed with BiPAP mask on in no acute distress.   Neck: No JVD Cardiac: IRIR, no murmurs, rubs, or gallops.  Respiratory: Clear to auscultation bilaterally. GI: Soft, nontender, non-distended  MS: 1-2+ LE edema, improved UE edema; No deformity. Neuro:  Nonfocal  Psych: Normal affect  Labs    High Sensitivity Troponin:   Recent Labs  Lab 12/18/20 1042 12/18/20 1357 12/22/20 1643  TROPONINIHS 11 14 5       Chemistry Recent Labs  Lab 12/22/20 1149 12/23/20 0510 12/24/20 0209  NA 127* 128* 131*  K 5.1 5.0 4.7  CL 95* 95* 98  CO2 22 22 21*  GLUCOSE 309* 345* 230*  BUN 85* 81* 89*  CREATININE 2.74* 2.56* 2.52*  CALCIUM 9.0 9.4 9.8  PROT 6.8 7.0 6.5  ALBUMIN 2.7* 2.9* 2.8*  AST 13* 16 13*  ALT 12 15 14   ALKPHOS 63 67 59  BILITOT 0.7 0.8 0.9  GFRNONAA 19* 20* 21*  ANIONGAP 10 11 12      Hematology Recent Labs  Lab 12/22/20 1149 12/23/20 0510 12/24/20 0209  WBC 3.2* 4.8 4.7  RBC 2.65* 2.79* 2.74*  HGB 8.4* 8.6* 8.6*  HCT 26.7* 28.2* 28.0*  MCV 100.8* 101.1* 102.2*  MCH 31.7 30.8 31.4  MCHC 31.5 30.5 30.7  RDW 17.5* 17.8* 17.6*  PLT 102* 152 155    BNP Recent Labs  Lab 12/18/20 1042  BNP 567.0*     DDimer No results  for input(s): DDIMER in the last 168 hours.   Radiology    [place] EKG SITE RITE  Result Date: 12/23/2020 If Site Rite image not attached, placement could not be confirmed due to current cardiac rhythm.   Cardiac Studies   ECHO 12/17/20  IMPRESSIONS   1. Left ventricular ejection fraction, by estimation, is 70 to 75%. The  left ventricle has hyperdynamic function. The left ventricle has no  regional wall motion abnormalities. Left ventricular diastolic parameters  are consistent with Grade II diastolic  dysfunction (pseudonormalization). Elevated left atrial pressure.  2. Right ventricular systolic function is normal. The right ventricular  size is normal. There is normal pulmonary artery systolic pressure.  3. The mitral valve is normal in structure. No evidence of mitral valve  regurgitation. No evidence of mitral stenosis.  4. The aortic valve is tricuspid. Aortic valve regurgitation is not  visualized. No aortic stenosis is present.  5. The inferior vena cava is normal in size with greater than 50%  respiratory variability, suggesting right atrial pressure of 3 mmHg.   Patient Profile     65 y.o.femalewith a hx of HTN, GERD, COPD, MGUS, HFpEF,SVT 01/2020,ESRD with renal transplant 2013 on immunosuppressants followed at [facility] and admitted 1.24.22 with diverticulitis after failing outpt therapy. Initially followed by cardiology for hypotension, however now with new onset atrial flutter with RVR.   Assessment & Plan      1. Atrial flutter with RVR: noted to have onset of typical atrial flutter with RVR on EKG yesterday (confirmed by Dr. Sallyanne Kuster) with HR sustained in the 130s despite po verapamil and IV metoprolol. Started on diltiazem gtt. Now with improvement in HR from 140s to 100s, though she remains in atrial flutter. Started on heparin gtt per pharmacy yesterday with anticipated TEE/DCCV this admission. Unfortunately there is no availability until Thursday 12/26/20. The  risks and benefits of transesophageal echocardiogram have been explained including risks of esophageal damage, perforation (1:10,000 risk), bleeding, pharyngeal hematoma as well as other potential complications associated with conscious sedation including aspiration, arrhythmia, respiratory failure and death. Alternatives to treatment were discussed, questions were answered. Patient is willing to proceed.  - Continue heparin gtt for stroke ppx - Continue diltiazem gtt for rate control - NPO after MN for TEE/DCCV 12/26/20 at 1:30pm with Dr. Margaretann Loveless   2.  Hypotension: resolved with management of her sepsis, now with intermittently elevated blood pressures.  - Managed in the context of #1  3. Acute colitis: initially failed outpatient antibiotics due to persistent N/V/D. Now improved - tolerating PO with normal BMs. Surgery following with no indication for surgical intervention at this time.  - Continue management per primary team and surgery  4. Acute on chronic diastolic CHF: Patient with increasing O2 requirements. Home lasix initially held due to [facility]. She was restarted on lasix 40mg  daily 12/21/20. Cr peaked at 2.74 yesterday and is stable at 2.52 today (baseline ~1.3). Weight is up 8lbs from admission to 173lbs today. UOP is net -1.2L in the past 24 hours. She received an additional dose of IV lasix yesterday given edema on exam.  - Continue IV lasix - will plan for an additional dose of IV lasix 40mg  this afternoon and monitor for response - Continue to monitor strict I&Os and daily weights - Continue to monitor electrolytes closely and replete as needed to maintain K>4, Mg >2  5. History of SVT: episode occurred 01/2020 in the setting of an acute asthma exacerbation. No evidence this admission.  - Likely to be managed in the context of #1.  - Consider referral to EP outpatient for ablation consideration  6. AoCKD in patient with hx of renal transplant: Cr peaked at 2.7 this admission, down  to 2.52 today. Baseline appears to be ~1.3. Seen by nephrology who felt AKI was likely 2/2 hypotension/hypoperfusion and recommended restarting home lasix upon discharge.  - Continue to monitor closely - Continue transplant management per primary team/nephrology  7. Raynaud's: suspect BBlocker has been avoided due to history of raynaud's and asthma - Continue imdur       For questions or updates, please contact [place] Please consult [email or web address] for contact info under        Signed, Abigail Butts, PA-C  12/24/2020, 7:57 AM

## 2020-12-24 NOTE — TOC Initial Note (Signed)
Transition of Care [facility]) - Initial/Assessment Note    Patient Details  Name: April [name] MRN: [id number] Date of Birth: 1956/03/23  [facility] CM/SW Contact:    Leeroy Cha, RN Phone Number: 12/24/2020, 8:07 AM  Clinical Narrative:                 65 year old woman admitted to the hospital on 1/24 with failed outpatient treatment for diverticulitis due to intractable nausea and vomiting.  Since admission she has developed A. fib with RVR.  Cardiology following, planning for TEE with cardioversion tomorrow.  Heart rate has remained greater than 140 for the past 24 hours.  So far improving on IV antibiotics- resolved leukocytosis and fevers.  Surgery recommending transition to oral antimicrobials to complete her course.  PCCM notified of poor IV access and requested to place central line.  Not a PICC candidate due to potential need for dialysis again in the future. PLAN: home with self care/daughter is contact Expected Discharge Plan: Home/Self Care Barriers to Discharge: Continued Medical Work up   Patient Goals and CMS Choice Patient states their goals for this hospitalization and ongoing recovery are:: to go home CMS Medicare.gov Compare Post Acute Care list provided to:: Patient    Expected Discharge Plan and Services Expected Discharge Plan: Home/Self Care   Discharge Planning Services: CM Consult   Living arrangements for the past 2 months: Single Family Home                                      Prior Living Arrangements/Services Living arrangements for the past 2 months: Single Family Home Lives with:: Spouse,Adult Children Patient language and need for interpreter reviewed:: Yes Do you feel safe going back to the place where you live?: Yes      Need for Family Participation in Patient Care: Yes (Comment) Care giver support system in place?: Yes (comment)   Criminal Activity/Legal Involvement Pertinent to Current  Situation/Hospitalization: No - Comment as needed  Activities of Daily Living Home Assistive Devices/Equipment: Bedside commode/3-in-1,Cane (specify quad or straight),Eyeglasses,Hand-held shower hose,Hospital bed,Long-handled sponge,Long-handled shoehorn,Nebulizer,Walker (specify type),Other (Comment),Wheelchair,Shower chair without back (ramp to entrance of house, front wheeled walker, single point cane, transport chair, manual wheelchair) ADL Screening (condition at time of admission) Patient's cognitive ability adequate to safely complete daily activities?: Yes Is the patient deaf or have difficulty hearing?: No Does the patient have difficulty seeing, even when wearing glasses/contacts?: Yes Does the patient have difficulty concentrating, remembering, or making decisions?: No Patient able to express need for assistance with ADLs?: Yes Does the patient have difficulty dressing or bathing?: Yes Independently performs ADLs?: No Communication: Independent Dressing (OT): Needs assistance Is this a change from baseline?: Pre-admission baseline Grooming: Independent Feeding: Independent Bathing: Needs assistance Is this a change from baseline?: Pre-admission baseline Toileting: Needs assistance Is this a change from baseline?: Pre-admission baseline In/Out Bed: Needs assistance Is this a change from baseline?: Pre-admission baseline Walks in Home: Needs assistance Is this a change from baseline?: Pre-admission baseline Does the patient have difficulty walking or climbing stairs?: Yes (secondary to weakness) Weakness of Legs: Both Weakness of Arms/Hands: None  Permission Sought/Granted                  Emotional Assessment Appearance:: Appears stated age Attitude/Demeanor/Rapport: Engaged Affect (typically observed): Calm Orientation: : Oriented to Place,Oriented to Self,Oriented to  Time,Oriented to Situation Alcohol /  Substance Use: Not Applicable Psych Involvement: No  (comment)  Admission diagnosis:  Sigmoid diverticulitis [K57.32] Acute diverticulitis [K57.92] Bilious vomiting with nausea [R11.14] Patient Active Problem List   Diagnosis Date Noted  . Bilious vomiting with nausea   . Sigmoid diverticulitis 12/16/2020  . Acute CHF (congestive heart failure) ([place]) 12/16/2020  . Fever of unknown origin 10/24/2020  . Fever of unknown origin (FUO) 10/23/2020  . Lower GI bleeding 09/25/2020  . Diarrhea 09/10/2020  . Acute renal failure superimposed on stage 3b chronic kidney disease ([place]) 09/08/2020  . Acute diverticulitis 09/04/2020  . SIRS (systemic inflammatory response syndrome) ([place]) 07/02/2020  . Chronic kidney disease, stage 3a ([place])   . 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 History of immunosuppressive therapy 05/27/2020  . FUO (fever of unknown origin) 05/15/2020  . Immunocompromised state ([place])   . Decubitus ulcer of coccyx, unstageable ([place]) 05/01/2020  . Pericardial effusion 04/16/2020  . Chronic respiratory failure with hypoxia ([place]) 04/16/2020  . Proteinuria 04/16/2020  . Pressure injury of skin 04/08/2020  . COPD (chronic obstructive pulmonary disease) ([place]) 04/07/2020  . SVT (supraventricular tachycardia) ([place])   . Anemia of chronic disease   . Palliative care encounter   . Goals of care, counseling/discussion   . Essential hypertension 02/08/2020  . Glaucoma 02/08/2020  . Sepsis ([place]) 01/31/2020  . SOB (shortness of breath)   . Fracture of left superior pubic ramus (H[facility])   . Pain   . Anxiety state   . Left displaced femoral neck fracture ([place]) 12/15/2019  . Status post total hip replacement, left   . Post-op pain   . Polycystic kidney   . Closed left hip fracture ([place]) 12/07/2019  . Left wrist fracture 12/07/2019  . Medication management 09/22/2019  . Physical deconditioning 09/22/2019  . Acute respiratory failure with hypoxia ([place]) 04/19/2019  . Acute on chronic diastolic CHF (congestive heart failure) ([place])  04/19/2019  . Bilateral closed proximal tibial fracture 12/21/2018  . Asthma, chronic, unspecified asthma severity, with acute exacerbation 10/03/2018  . Chronic diastolic CHF (congestive heart failure) ([place]) 10/01/2017  . Asthma, mild intermittent 07/15/2016  . GERD without esophagitis 07/15/2016  . Renal transplant recipient 07/15/2016  . Hyperlipidemia 07/15/2016  . Depression 07/15/2016  . Bronchitis, mucopurulent recurrent ([place]) 08/08/2014  . Chronic cough 07/24/2014  . End stage renal disease ([place]) 03/23/2012  . 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, implant, and graft 03/23/2012   PCP:  [name], MD Pharmacy:   [facility], [place] [address] Phone: [phone] Fax: [phone]     Social Determinants of Health (SDOH) Interventions    Readmission Risk Interventions Readmission Risk Prevention Plan 05/15/2020 04/19/2020 02/09/2020  Transportation Screening Complete Complete Complete  PCP or Specialist Appt within 3-5 Days - - -  Not Complete comments - - -  HRI or [place] or Home Care Consult comments - - -  Social Work Consult for [place] Planning/Counseling - - -  SW consult not completed comments - - -  Palliative Care Screening - - -  Medication Review ([place]) Referral to Pharmacy Complete Referral to Pharmacy  PCP or Specialist appointment within 3-5 days of discharge Complete Complete Complete  HRI or Home Care Consult Complete Complete Complete  SW Recovery Care/Counseling Consult Complete Complete -  [place] Not Applicable Not Applicable Complete  [place]  Complete Complete Patient Refused  Some recent data might be hidden

## 2020-12-24 NOTE — Progress Notes (Addendum)
NAME:  April [name], MRN:  [id number], DOB:  20-Dec-1955, LOS: 8 ADMISSION DATE:  12/16/2020, CONSULTATION DATE:  12/23/20 REFERRING MD:  Rodena Piety, CHIEF COMPLAINT:  Afib with RVR, poor IV access   Brief History:  H/o renal transplant, admitted with acute diverticulitis, new onset Afib with RVR, poor IV access on the floor and concern for decompensation, requesting CVC  History of Present Illness:  Mrs. Koepke is a 65 year old woman admitted to the hospital on 1/24 with failed outpatient treatment for diverticulitis due to intractable nausea and vomiting.  Since admission she has developed A. fib with RVR.  Cardiology following, planning for TEE with cardioversion tomorrow.  Heart rate has remained greater than 140 for the past 24 hours.  So far improving on IV antibiotics- resolved leukocytosis and fevers.  Surgery recommending transition to oral antimicrobials to complete her course.  PCCM notified of poor IV access and requested to place central line.  Not a PICC candidate due to potential need for dialysis again in the future.  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  Normal RV.  Normal valves.  Micro Data:    Antimicrobials:  unasyn 1/25- 1/31 Ciprofloxacin 1/24 Flagyl 1/24off Zosyn 1/24off 12/23/2020 Augmentin 12/23/2020 Valtrex Interim History / Subjective:  Intermittently requiring BiPAP for anxiety  Objective   Blood pressure (!) 148/71, pulse (!) 105, temperature 97.6 F (36.4 C), temperature source Axillary, resp. rate 17, height 5\' 1"  (1.549 m), weight 78.5 kg, SpO2 99 %.    FiO2 (%):  [30 %] 30 %   Intake/Output Summary (Last 24 hours) at 12/24/2020 0953 Last data filed at 12/24/2020  7035 Gross per 24 hour  Intake 878.43 ml  Output 2075 ml  Net -1196.57 ml   Filed Weights   12/23/20 0600 12/23/20 1708 12/24/20 0500  Weight: 78.5 kg 77.8 kg 78.5 kg    Examination: General: Nourished female no acute distress at rest  HEENT: Currently on BiPAP mask no JVD is appreciated Neuro: Grossly intact without focal defect, very nervous CV: Heart sounds are regular atrial fibrillation PULM: Noted diminished breath sounds throughout GI: soft, bsx4 active  GU: Voids Extremities: Bilateral hands with ecchymosis, peripheral edema upper and lower extremities. Skin: Warm   Resolved Hospital Problem list     Assessment & Plan:   Afib with RVR, new onset likely due to hypervolemia, acute illness Currently on Eliquis 5 mg twice daily TEE with cardioversion is planned for 12/24/2020 per cardiology Currently on diltiazem drip Metroprolol 25 mg twice daily p.o. Continue aggressive diuresis    Acute hypercapneic, chronic hypoxic respiratory failure History of COPD High risk for OSA  Nocturnal and as needed noninvasive mechanical ventilatory support Oxygen as needed ABGs as needed Noted to be on anxiolytics which may lead to hypercarbia      AKI on CKD, history of end-stage renal disease due to polycystic kidney disease, status post transplant. AKI felt to be due to hypoperfusion Recent Labs  Lab 12/22/20 1149 12/23/20 0510 12/24/20 0209  K 5.1 5.0 4.7    Lab Results  Component Value Date   CREATININE 2.52 (H) 12/24/2020   CREATININE 2.56 (H) 12/23/2020   CREATININE 2.74 (H) 12/22/2020   CREATININE 1.28 (H) 11/04/2020   CREATININE 1.30 (H) 08/05/2020  CREATININE 1.07 (H) 06/10/2020  Immunosuppressants per nephrology Renal dose medications Diuresis as able Avoid nephrotoxins Avoid PICC line if possible   Sepsis due to acute diverticulitis Appreciate surgery's input Transition to oral antimicrobial therapy with Augmentin, and Flagyl and  ciprofloxacin Immunosuppressants discussed with pharmacy   -Per surgery okay to transition to oral medications.  Starting Augmentin; oral Flagyl and ciprofloxacin have DDI with her immunosuppressants- discussed with PharmD. Discontinue Unasyn. -Appreciate surgery's assistance.  Poor peripheral IV access Continue with peripheral IVs Transition medications to p.o. Try to avoid central line infection less likely twice daily stepdown environment within 24 to 48 hours     Hyperglycemia most likely steroid exacerbation CBG (last 3)  Recent Labs    12/23/20 1726 12/23/20 2127 12/24/20 0749  GLUCAP 291* 340* 216*    Per primary's notes steroids for heart failure-would like to aggressively de-escalate this.  Currently on Solu-Medrol 60 mg twice daily  Sliding-scale insulin protocol Insulin 5 units subcu 3 times a day with meals Lantus 10 units 2 times daily Further de-escalation of steroids recommended.   Best practice (evaluated daily)  Diet: NPO past midnight for TEE / DCCV Pain/Anxiety/Delirium protocol (if indicated): n/a VAP protocol (if indicated): n/a DVT prophylaxis: eliquis GI prophylaxis: pantoprazole Glucose control: basal bolus, SSI Mobility: bed rest Disposition:SD , she can transfer to progressive unit and PCCM available as needed.  Goals of Care:  Last date of multidisciplinary goals of care discussion: Family and staff present:  Summary of discussion:  Follow up goals of care discussion due:  Code Status: full  Labs   CBC: Recent Labs  Lab 12/20/20 0704 12/21/20 0644 12/22/20 1149 12/23/20 0510 12/24/20 0209  WBC 5.6 4.4 3.2* 4.8 4.7  HGB 8.9* 8.6* 8.4* 8.6* 8.6*  HCT 26.6* 27.4* 26.7* 28.2* 28.0*  MCV 95.3 100.0 100.8* 101.1* 102.2*  PLT PLATELET CLUMPS NOTED ON SMEAR, UNABLE TO ESTIMATE 111* 102* 152 202    Basic Metabolic Panel: Recent Labs  Lab 12/18/20 0604 12/19/20 0552 12/20/20 0704 12/21/20 0644 12/22/20 1149 12/23/20 0510  12/24/20 0209  NA 130*   < > 124* 123* 127* 128* 131*  K 4.7   < > 5.3* 5.2* 5.1 5.0 4.7  CL 96*   < > 93* 91* 95* 95* 98  CO2 15*   < > 20* 20* 22 22 21*  GLUCOSE 86   < > 281* 227* 309* 345* 230*  BUN 51*   < > 62* 71* 85* 81* 89*  CREATININE 2.52*   < > 2.12* 2.46* 2.74* 2.56* 2.52*  CALCIUM 8.4*   < > 8.3* 8.8* 9.0 9.4 9.8  MG 1.9  --   --   --   --   --   --   PHOS 6.1*  --   --   --   --   --   --    < > = values in this interval not displayed.   GFR: Estimated Creatinine Clearance: 21.7 mL/min (A) (by C-G formula based on SCr of 2.52 mg/dL (H)). Recent Labs  Lab 12/21/20 0644 12/22/20 1149 12/23/20 0510 12/24/20 0209  WBC 4.4 3.2* 4.8 4.7    Liver Function Tests: Recent Labs  Lab 12/18/20 0604 12/19/20 0552 12/22/20 1149 12/23/20 0510 12/24/20 0209  AST 15 13* 13* 16 13*  ALT 11 10 12 15 14   ALKPHOS 50 50 63 67 59  BILITOT 1.3* 1.1 0.7 0.8 0.9  PROT 6.7 6.8 6.8 7.0 6.5  ALBUMIN 2.8* 2.6*  2.7* 2.9* 2.8*   No results for input(s): LIPASE, AMYLASE in the last 168 hours. No results for input(s): AMMONIA in the last 168 hours.  ABG    Component Value Date/Time   PHART 7.264 (L) 12/23/2020 1602   PCO2ART 50.4 (H) 12/23/2020 1602   PO2ART 113 (H) 12/23/2020 1602   HCO3 22.1 12/23/2020 1602   TCO2 24 10/06/2020 2019   ACIDBASEDEF 4.3 (H) 12/23/2020 1602   O2SAT 98.4 12/23/2020 1602     Coagulation Profile: No results for input(s): INR, PROTIME in the last 168 hours.  Cardiac Enzymes: No results for input(s): CKTOTAL, CKMB, CKMBINDEX, TROPONINI in the last 168 hours.  HbA1C: Hgb A1c MFr Bld  Date/Time Value Ref Range Status  12/16/2020 09:02 AM 5.6 4.8 - 5.6 % Final    Comment:    (NOTE) Pre diabetes:          5.7%-6.4%  Diabetes:              >6.4%  Glycemic control for   <7.0% adults with diabetes   09/10/2020 02:47 PM 5.6 4.8 - 5.6 % Final    Comment:    (NOTE)         Prediabetes: 5.7 - 6.4         Diabetes: >6.4         Glycemic  control for adults with diabetes: <7.0     CBG: Recent Labs  Lab 12/23/20 1130 12/23/20 1447 12/23/20 1726 12/23/20 2127 12/24/20 0749  GLUCAP 454* 377* [place] Dosia Yodice ACNP Acute Care Nurse Practitioner [facility] Please consult Amion 12/24/2020, 9:54 AM

## 2020-12-24 NOTE — Progress Notes (Signed)
PROGRESS NOTE    April [name]  EYE:[id number] DOB: 1956/08/05 DOA: 12/16/2020 PCP: [name], MD    Brief Narrative:65 year old female with history of COPD/chronic RF on 2 L, ESRD s/p deceased donor renal transplant in 05/2012 on immunosuppressants followed up at [facility], [place] followed by Dr. Royce Macadamia, diastolic CHF, MGUS, HTN, debility and recent ED visit for loose uncomplicated sigmoid diverticulitis on 12/14/2020 returning with LLQ pain, vomiting, shortness of breath and fever.  She was a started on Cipro and Flagyl 2 days prior but was unable to take due to nausea and vomiting.  In ED, slightly tachycardic and tachypneic.  Significant labs include WBC 20.8, bicarb 19, AG 17, Cr 1.33 (1.16 two days prior), BUN 39 and total bili 3.1.  proBNP 01-Feb-2871.  Lactic acid negative.  CXR with vascular congestion and cardiomegaly.  COVID-19 PCR negative.  General surgery consulted.  CT abdomen and pelvis, LE [place] and TTE ordered.  CT abdomen and pelvis showed mild uncomplicated sigmoid diverticulitis, polycystic kidney or liver disease as before and unremarkable appearance of right pelvic transplanted kidney. LE [place] consistent with age-indeterminate nonocclusive DVT in the right popliteal vein not noted on previous ultrasound in 04/2020. TTE with LVEF of 70 to 75%, G2 DD, normal RVSP.   12/18/2020-patient was very tachypneic complaining of pleuritic chest pain worse with breathing, VQ scan low probability for pulmonary embolism had indeterminate DVT on heparin drip.  Her creatinine is also trending up.  Patient has history of renal transplant nephrology has been consulted.  Chest x-ray done shows fluid overload.  Not on any diuretics at this time. 1/27-she continues to c/o sob asking for solumedrol,wheezing sitting up in commode  Had some loose BM 12/20/2020 she was started on Solu-Medrol and Xanax yesterday. She is feeling better today. She feels her lungs are less tight and she is less short of  breath. 12/22/2020 she was actually feeling better today however she is in a flutter with a rate in the 130s to 140s which she does not feel. She feels her breathing is better and abdominal pain and diarrhea is better and tolerating p.o. intake. She reports feeling better today than yesterday. 12/23/2020 patient was moved to 4 E. for IV Cardizem drip for rapid a flutter.  Her rate has not been come down at all she still remains in the rate of 130s to 140s asymptomatic.  Seen by cardiology today started her on heparin and plan is for TEE with cardioversion tomorrow.  Assessment & Plan:   Active Problems:   GERD without esophagitis   Renal transplant recipient   Depression   Sepsis ([place])   SOB (shortness of breath)   Acute diverticulitis   Sigmoid diverticulitis   Acute CHF (congestive heart failure) (HCC)   Bilious vomiting with nausea  #1 acute on chronic diastolic heart failure-  Patient has been tachypneic tachycardic with increasing oxygen requirements.  She takes Lasix 80 mg daily at home Her Lasix has been on hold till 12/20/2020 and then was started on Lasix 40 mg daily on 12/21/2020.  Recent echo with ejection fraction 70 to 75% and grade 2 diastolic dysfunction. Taper Solu-Medrol.  #2 AKI on CKD stage III -due to hypotension  in patient with history of ESRD due to polycystic kidney disease status post deceased donor transplant in 2012/02/02. Diuresis plus or minus fluid per nephrology.  Patient appears volume overloaded with bilateral upper and lower extremity edema and chest x-ray with fluid overload but with hypotension and increased creatinine. Continue  CellCept and Prograf. Creatinine 2.46 from  2.12 from 2.25 from (after 500 cc NS) 2.52 from 1.67 from 1.33 on admission  #3 severe sepsis secondary to sigmoid diverticulitis present on admission.  She met sepsis criteria on admission with tachypnea tachycardia hypotension leukocytosis and endorgan damage such as AKI.  She was initially on  Zosyn changed to Unasyn in the setting of AKI.  General surgery following appreciate their input. Now on regular diet.  General surgery signed off on 12/23/2020  #4 anemia of chronic disease hemoglobin stable.  #5 right popliteal DVT age indeterminate nonocclusive.  VQ scan low probability for PE.  DC heparin drip 1/26  #6 acute on chronic hypoxic respiratory failure secondary to multifactorial etiology including COPD/diastolic heart failure patient currently on 4 L to maintain her saturation above 92%.  She is on 2 L at home prior to admission.  #7 history of essential hypertension her blood pressure has been soft-she is on Imdur at home which we have been holding.    #8 thrombocytopenia chronic monitor closely.  #9 history of GERD on Protonix.  #10 worsening leukocytosis -on Unasyn  #11 hyponatremia NA dropped to 123 from 124 from127 from 130 Lasix Na tabs recheck labs in am  #12  Steroid-induced hyperglycemia patient started on Lantus and SSI.    #13 A flutter-heart rate in 130s to 140s. She has no new symptoms.  She has been started on Cardizem drip and is scheduled for TEE/DC cardioversion tomorrow at [facility].  N.p.o. after midnight. Eliquis 5 mg twice a day.  #14 hyperkalemia -resolved Pressure Injury 10/23/20 Sacrum Right;Left Stage 2 -  Partial thickness loss of dermis presenting as a shallow open injury with a red, pink wound bed without slough. (Active)  10/23/20 2058  Location: Sacrum  Location Orientation: Right;Left  Staging: Stage 2 -  Partial thickness loss of dermis presenting as a shallow open injury with a red, pink wound bed without slough.  Wound Description (Comments):   Present on Admission:     Estimated body mass index is 32.7 kg/m as calculated from the following:   Height as of this encounter: 5' 1"  (1.549 m).   Weight as of this encounter: 78.5 kg.  DVT prophylaxis: Heparin Code Status: Full code Family Communication discussed with daughter [name] over  the phone  disposition Plan:  Status is: Inpatient  Dispo: The patient is from: Home              Anticipated d/c is to: Home              Anticipated d/c date is: > 3 days              Patient currently is not medically stable to d/c.  She is a kidney transplant patient with CHF COPD with worsening leukocytosis and creatinine.   Difficult to place patient not sure yet.    Consultants:   Nephrology, cardiology and general surgery  Procedures: None Antimicrobials Unasyn  Subjective: Patient resting in bed on BiPAP Overnight events noted still with rapid A. fib on Cardizem Cardiology notes PCCM notes renal notes reviewed  Objective: Vitals:   12/24/20 1007 12/24/20 1200 12/24/20 1600 12/24/20 1638  BP: (!) 160/74 (!) 122/53 140/63 133/61  Pulse: (!) 109 96 (!) 109 (!) 106  Resp:  18 19   Temp:  97.8 F (36.6 C)    TempSrc:  Oral    SpO2:  97% 100%   Weight:      Height:  Intake/Output Summary (Last 24 hours) at 12/24/2020 1710 Last data filed at 12/24/2020 0611 Gross per 24 hour  Intake 638.43 ml  Output 1325 ml  Net -686.57 ml   Filed Weights   12/23/20 0600 12/23/20 1708 12/24/20 0500  Weight: 78.5 kg 77.8 kg 78.5 kg    Examination:  General exam: Appears calm and comfortable  Respiratory system: Diminished breath sounds at the bases  cardiovascular system: S1 & S2 heard, RRR. No JVD, murmurs, rubs, gallops or clicks. No pedal edema. Gastrointestinal system: Abdomen is nondistended, soft and nontender. No organomegaly or masses felt. Normal bowel sounds heard. Central nervous system: Alert and oriented. No focal neurological deficits. Extremities: 1+ pitting edema bilateral upper and lower extremities skin: No rashes, lesions or ulcers Psychiatry: Judgement and insight appear normal. Mood & affect appropriate.     Data Reviewed: I have personally reviewed following labs and imaging studies  CBC: Recent Labs  Lab 12/20/20 0704 12/21/20 0644  12/22/20 1149 12/23/20 0510 12/24/20 0209  WBC 5.6 4.4 3.2* 4.8 4.7  HGB 8.9* 8.6* 8.4* 8.6* 8.6*  HCT 26.6* 27.4* 26.7* 28.2* 28.0*  MCV 95.3 100.0 100.8* 101.1* 102.2*  PLT PLATELET CLUMPS NOTED ON SMEAR, UNABLE TO ESTIMATE 111* 102* 152 962   Basic Metabolic Panel: Recent Labs  Lab 12/18/20 0604 12/19/20 0552 12/20/20 0704 12/21/20 0644 12/22/20 1149 12/23/20 0510 12/24/20 0209  NA 130*   < > 124* 123* 127* 128* 131*  K 4.7   < > 5.3* 5.2* 5.1 5.0 4.7  CL 96*   < > 93* 91* 95* 95* 98  CO2 15*   < > 20* 20* 22 22 21*  GLUCOSE 86   < > 281* 227* 309* 345* 230*  BUN 51*   < > 62* 71* 85* 81* 89*  CREATININE 2.52*   < > 2.12* 2.46* 2.74* 2.56* 2.52*  CALCIUM 8.4*   < > 8.3* 8.8* 9.0 9.4 9.8  MG 1.9  --   --   --   --   --   --   PHOS 6.1*  --   --   --   --   --   --    < > = values in this interval not displayed.   GFR: Estimated Creatinine Clearance: 21.7 mL/min (A) (by C-G formula based on SCr of 2.52 mg/dL (H)). Liver Function Tests: Recent Labs  Lab 12/18/20 0604 12/19/20 0552 12/22/20 1149 12/23/20 0510 12/24/20 0209  AST 15 13* 13* 16 13*  ALT 11 10 12 15 14   ALKPHOS 50 50 63 67 59  BILITOT 1.3* 1.1 0.7 0.8 0.9  PROT 6.7 6.8 6.8 7.0 6.5  ALBUMIN 2.8* 2.6* 2.7* 2.9* 2.8*   No results for input(s): LIPASE, AMYLASE in the last 168 hours. No results for input(s): AMMONIA in the last 168 hours. Coagulation Profile: No results for input(s): INR, PROTIME in the last 168 hours. Cardiac Enzymes: No results for input(s): CKTOTAL, CKMB, CKMBINDEX, TROPONINI in the last 168 hours. BNP (last 3 results) No results for input(s): PROBNP in the last 8760 hours. HbA1C: No results for input(s): HGBA1C in the last 72 hours. CBG: Recent Labs  Lab 12/23/20 1726 12/23/20 2127 12/24/20 0749 12/24/20 1147 12/24/20 1647  GLUCAP 291* 340* 216* 218* 255*   Lipid Profile: No results for input(s): CHOL, HDL, LDLCALC, TRIG, CHOLHDL, LDLDIRECT in the last 72  hours. Thyroid Function Tests: No results for input(s): TSH, T4TOTAL, FREET4, T3FREE, THYROIDAB in the last 72 hours. Anemia  Panel: No results for input(s): VITAMINB12, FOLATE, FERRITIN, TIBC, IRON, RETICCTPCT in the last 72 hours. Sepsis Labs: No results for input(s): PROCALCITON, LATICACIDVEN in the last 168 hours.  Recent Results (from the past 240 hour(s))  Culture, blood (Routine x 2)     Status: None   Collection Time: 12/14/20 10:42 PM   Specimen: Right Antecubital; Blood  Result Value Ref Range Status   Specimen Description   Final    RIGHT ANTECUBITAL Performed at [facility] [address] [place], [place] [address]    Special Requests   Final    BOTTLES DRAWN AEROBIC AND ANAEROBIC Blood Culture adequate volume Performed at [facility] [address], [place], [place] [address]    Culture   Final    NO GROWTH 5 DAYS Performed at [facility], [place] [address] [place], [place] [address]    Report Status 12/20/2020 FINAL  Final  Culture, blood (Routine x 2)     Status: None   Collection Time: 12/14/20 10:47 PM   Specimen: BLOOD RIGHT FOREARM  Result Value Ref Range Status   Specimen Description   Final    BLOOD RIGHT FOREARM Performed at [facility] [address], [place], [place] [address]    Special Requests   Final    BOTTLES DRAWN AEROBIC AND ANAEROBIC Blood Culture results may not be optimal due to an inadequate volume of blood received in culture bottles Performed at [facility] [address], [place], [place] [address]    Culture   Final    NO GROWTH 5 DAYS Performed at [facility], [place] [address], [place], [place] [address]    Report Status 12/20/2020 FINAL  Final  SARS CORONAVIRUS 2 (TAT 6-24 HRS) Nasopharyngeal Nasopharyngeal Swab     Status: None   Collection Time: 12/14/20 11:36 PM   Specimen: Nasopharyngeal Swab  Result Value Ref Range Status   SARS  Coronavirus 2 NEGATIVE NEGATIVE Final    Comment: (NOTE) SARS-CoV-2 target nucleic acids are NOT DETECTED.  The SARS-CoV-2 RNA is generally detectable in upper and lower respiratory specimens during the acute phase of infection. Negative results do not preclude SARS-CoV-2 infection, do not rule out co-infections with other pathogens, and should not be used as the sole basis for treatment or other patient management decisions. Negative results must be combined with clinical observations, patient history, and epidemiological information. The expected result is Negative.  Fact Sheet for Patients: SugarRoll.be  Fact Sheet for Healthcare Providers: https://www.[facility].com/  This test is not yet approved or cleared by the [place] FDA and  has been authorized for detection and/or diagnosis of SARS-CoV-2 by FDA under an Emergency Use Authorization (EUA). This EUA will remain  in effect (meaning this test can be used) for the duration of the COVID-19 declaration under Se ction 564(b)(1) of the Act, 21 U.S.C. section 360bbb-3(b)(1), unless the authorization is terminated or revoked sooner.  Performed at [facility], [place] [address], [place],  [address]   MRSA PCR Screening     Status: None   Collection Time: 12/23/20  5:45 PM   Specimen: Nasal Mucosa; Nasopharyngeal  Result Value Ref Range Status   MRSA by PCR NEGATIVE NEGATIVE Final    Comment:        The GeneXpert MRSA Assay (FDA approved for NASAL specimens only), is one component of a comprehensive MRSA colonization surveillance program. It is not intended to diagnose  MRSA infection nor to guide or monitor treatment for MRSA infections. Performed at [facility], [place] [address] [place], [place] [address]          Radiology Studies: [place] EKG SITE RITE  Result Date: 12/23/2020 If Site Rite image not attached, placement could not be  confirmed due to current cardiac rhythm.       Scheduled Meds: . allopurinol  100 mg Oral Daily  . amoxicillin-clavulanate  1 tablet Oral BID  . apixaban  5 mg Oral BID  . arformoterol  15 mcg Nebulization BID   And  . umeclidinium bromide  1 puff Inhalation Daily   And  . budesonide (PULMICORT) nebulizer solution  0.5 mg Nebulization BID  . brimonidine  1 drop Both Eyes BID  . Chlorhexidine Gluconate Cloth  6 each Topical Daily  . cycloSPORINE  1 drop Both Eyes BID  . dorzolamide  1 drop Both Eyes Daily  . ferrous sulfate  325 mg Oral Q breakfast  . FLUoxetine  40 mg Oral Daily  . furosemide  40 mg Intravenous Once  . gabapentin  200 mg Oral BID  . guaiFENesin  600 mg Oral BID  . hydrALAZINE  25 mg Oral Q8H  . insulin aspart  0-5 Units Subcutaneous QHS  . insulin aspart  0-9 Units Subcutaneous TID WC  . insulin aspart  5 Units Subcutaneous TID WC  . insulin glargine  10 Units Subcutaneous BID  . isosorbide mononitrate  30 mg Oral Daily  . latanoprost  1 drop Both Eyes QHS  . magnesium oxide  400 mg Oral BID  . mouth rinse  15 mL Mouth Rinse BID  . methylPREDNISolone (SOLU-MEDROL) injection  60 mg Intravenous Q12H  . metoprolol tartrate  50 mg Oral BID  . mycophenolate  180 mg Oral BID  . pantoprazole  40 mg Oral BID  . saccharomyces boulardii  250 mg Oral BID  . tacrolimus  1 mg Oral BID  . valACYclovir  500 mg Oral BID  . cyanocobalamin  1,000 mcg Oral Daily   Continuous Infusions: . diltiazem (CARDIZEM) infusion 15 mg/hr (12/24/20 1100)     LOS: 8 days     Georgette Shell, MD 12/24/2020, 5:10 PM

## 2020-12-25 ENCOUNTER — Inpatient Hospital Stay (HOSPITAL_COMMUNITY): Payer: Medicare Other

## 2020-12-25 ENCOUNTER — Encounter (HOSPITAL_COMMUNITY)
Admission: EM | Disposition: A | Discharge status: Skilled Nursing Facility | Payer: Self-pay | Source: Home / Self Care | Attending: Student

## 2020-12-25 ENCOUNTER — Inpatient Hospital Stay (HOSPITAL_COMMUNITY): Payer: Medicare Other | Admitting: Certified Registered Nurse Anesthetist

## 2020-12-25 ENCOUNTER — Encounter (HOSPITAL_COMMUNITY): Payer: Self-pay | Admitting: Internal Medicine

## 2020-12-25 DIAGNOSIS — I340 Nonrheumatic mitral (valve) insufficiency: Secondary | ICD-10-CM

## 2020-12-25 DIAGNOSIS — I5031 Acute diastolic (congestive) heart failure: Secondary | ICD-10-CM | POA: Diagnosis not present

## 2020-12-25 DIAGNOSIS — I361 Nonrheumatic tricuspid (valve) insufficiency: Secondary | ICD-10-CM

## 2020-12-25 DIAGNOSIS — I4892 Unspecified atrial flutter: Secondary | ICD-10-CM

## 2020-12-25 HISTORY — PX: CARDIOVERSION: SHX1299

## 2020-12-25 HISTORY — PX: TEE WITHOUT CARDIOVERSION: SHX5443

## 2020-12-25 LAB — CBC
HCT: 27.8 % — ABNORMAL LOW (ref 36.0–46.0)
Hemoglobin: 8.4 g/dL — ABNORMAL LOW (ref 12.0–15.0)
MCH: 30.8 pg (ref 26.0–34.0)
MCHC: 30.2 g/dL (ref 30.0–36.0)
MCV: 101.8 fL — ABNORMAL HIGH (ref 80.0–100.0)
Platelets: 148 10*3/uL — ABNORMAL LOW (ref 150–400)
RBC: 2.73 MIL/uL — ABNORMAL LOW (ref 3.87–5.11)
RDW: 17.4 % — ABNORMAL HIGH (ref 11.5–15.5)
WBC: 3.8 10*3/uL — ABNORMAL LOW (ref 4.0–10.5)
nRBC: 0 % (ref 0.0–0.2)

## 2020-12-25 LAB — URINALYSIS, COMPLETE (UACMP) WITH MICROSCOPIC
Bilirubin Urine: NEGATIVE
Glucose, UA: 50 mg/dL — AB
Hgb urine dipstick: NEGATIVE
Ketones, ur: NEGATIVE mg/dL
Leukocytes,Ua: NEGATIVE
Nitrite: NEGATIVE
Protein, ur: NEGATIVE mg/dL
Specific Gravity, Urine: 1.015 (ref 1.005–1.030)
pH: 5 (ref 5.0–8.0)

## 2020-12-25 LAB — GLUCOSE, CAPILLARY
Glucose-Capillary: 245 mg/dL — ABNORMAL HIGH (ref 70–99)
Glucose-Capillary: 237 mg/dL — ABNORMAL HIGH (ref 70–99)
Glucose-Capillary: 253 mg/dL — ABNORMAL HIGH (ref 70–99)
Glucose-Capillary: 285 mg/dL — ABNORMAL HIGH (ref 70–99)

## 2020-12-25 LAB — PROTIME-INR
INR: 1.5 — ABNORMAL HIGH (ref 0.8–1.2)
Prothrombin Time: 17.1 seconds — ABNORMAL HIGH (ref 11.4–15.2)

## 2020-12-25 LAB — PHOSPHORUS: Phosphorus: 4.6 mg/dL (ref 2.5–4.6)

## 2020-12-25 LAB — BASIC METABOLIC PANEL
Anion gap: 10 (ref 5–15)
BUN: 101 mg/dL — ABNORMAL HIGH (ref 8–23)
CO2: 22 mmol/L (ref 22–32)
Calcium: 9.7 mg/dL (ref 8.9–10.3)
Chloride: 96 mmol/L — ABNORMAL LOW (ref 98–111)
Creatinine, Ser: 2.38 mg/dL — ABNORMAL HIGH (ref 0.44–1.00)
GFR, Estimated: 22 mL/min — ABNORMAL LOW (ref >60)
Glucose, Bld: 287 mg/dL — ABNORMAL HIGH (ref 70–99)
Potassium: 4.8 mmol/L (ref 3.5–5.1)
Sodium: 128 mmol/L — ABNORMAL LOW (ref 135–145)

## 2020-12-25 LAB — SODIUM, URINE, RANDOM: Sodium, Ur: <10 mmol/L

## 2020-12-25 LAB — PROTEIN / CREATININE RATIO, URINE
Creatinine, Urine: 60.77 mg/dL
Protein Creatinine Ratio: 0.25 mg/mg{Cre} — ABNORMAL HIGH (ref 0.00–0.15)
Total Protein, Urine: 15 mg/dL

## 2020-12-25 LAB — CREATININE, URINE, RANDOM: Creatinine, Urine: 59.67 mg/dL

## 2020-12-25 LAB — MAGNESIUM: Magnesium: 2.5 mg/dL — ABNORMAL HIGH (ref 1.7–2.4)

## 2020-12-25 LAB — SARS CORONAVIRUS 2 BY RT PCR (HOSPITAL ORDER, PERFORMED IN ~~LOC~~ HOSPITAL LAB): SARS Coronavirus 2: NEGATIVE

## 2020-12-25 SURGERY — ECHOCARDIOGRAM, TRANSESOPHAGEAL
Anesthesia: Monitor Anesthesia Care

## 2020-12-25 MED ORDER — PROPOFOL 500 MG/50ML IV EMUL
INTRAVENOUS | Status: DC | PRN
  Administered 2020-12-25: 75 ug/kg/min via INTRAVENOUS

## 2020-12-25 MED ORDER — BUTAMBEN-TETRACAINE-BENZOCAINE 2-2-14 % EX AERO
INHALATION_SPRAY | CUTANEOUS | Status: DC | PRN
  Administered 2020-12-25: 2 via TOPICAL

## 2020-12-25 MED ORDER — PROPOFOL 10 MG/ML IV BOLUS
INTRAVENOUS | Status: DC | PRN
  Administered 2020-12-25 (×2): 10 mg via INTRAVENOUS

## 2020-12-25 MED ORDER — SODIUM CHLORIDE 0.9 % IV SOLN: INTRAVENOUS | Status: DC | PRN

## 2020-12-25 NOTE — Anesthesia Procedure Notes (Signed)
Procedure Name: General with mask airway Date/Time: 12/25/2020 1:00 PM Performed by: Harden Mo, CRNA Pre-anesthesia Checklist: Patient identified, Emergency Drugs available, Suction available and Patient being monitored Patient Re-evaluated:Patient Re-evaluated prior to induction Oxygen Delivery Method: Nasal cannula Preoxygenation: Pre-oxygenation with 100% oxygen Induction Type: IV induction Placement Confirmation: positive ETCO2 and breath sounds checked- equal and bilateral Dental Injury: Teeth and Oropharynx as per pre-operative assessment

## 2020-12-25 NOTE — Anesthesia Postprocedure Evaluation (Signed)
Anesthesia Post Note  Patient: April [name]  Procedure(s) Performed: TRANSESOPHAGEAL ECHOCARDIOGRAM (TEE) (N/A ) CARDIOVERSION (N/A )     Patient location during evaluation: PACU Anesthesia Type: General Level of consciousness: awake and alert Pain management: pain level controlled Vital Signs Assessment: post-procedure vital signs reviewed and stable Respiratory status: spontaneous breathing, nonlabored ventilation, respiratory function stable and patient connected to nasal cannula oxygen Cardiovascular status: blood pressure returned to baseline and stable Postop Assessment: no apparent nausea or vomiting Anesthetic complications: no   No complications documented.  Last Vitals:  Vitals:   12/25/20 1351 12/25/20 1401  BP: (!) 153/56 (!) 164/55  Pulse: (!) 57 (!) 59  Resp: 18 (!) 28  Temp:    SpO2: 99% 99%    Last Pain:  Vitals:   12/25/20 1401  TempSrc:   PainSc: 0-No pain                 Audry Pili

## 2020-12-25 NOTE — Transfer of Care (Signed)
Immediate Anesthesia Transfer of Care Note  Patient: April [name]  Procedure(s) Performed: TRANSESOPHAGEAL ECHOCARDIOGRAM (TEE) (N/A ) CARDIOVERSION (N/A )  Patient Location: Endoscopy Unit  Anesthesia Type:MAC  Level of Consciousness: awake, alert  and oriented  Airway & Oxygen Therapy: Patient Spontanous Breathing and Patient connected to nasal cannula oxygen  Post-op Assessment: Report given to RN and Post -op Vital signs reviewed and stable  Post vital signs: Reviewed and stable  Last Vitals:  Vitals Value Taken Time  BP 136/59 12/25/20 1331  Temp    Pulse 52 12/25/20 1333  Resp 22 12/25/20 1333  SpO2 97 % 12/25/20 1333  Vitals shown include unvalidated device data.  Last Pain:  Vitals:   12/25/20 1109  TempSrc:   PainSc: 0-No pain      Patients Stated Pain Goal: 0 (58/59/29 2446)  Complications: No complications documented.

## 2020-12-25 NOTE — CV Procedure (Signed)
   TRANSESOPHAGEAL ECHOCARDIOGRAM GUIDED DIRECT CURRENT CARDIOVERSION  NAME:  April [name]    MRN: [id number] DOB:  [birth year]    ADMIT DATE: 12/16/2020  INDICATIONS: Symptomatic atrial flutter  PROCEDURE:   Informed consent was obtained prior to the procedure. The risks, benefits and alternatives for the procedure were discussed and the patient comprehended these risks.  Risks include, but are not limited to, cough, sore throat, vomiting, nausea, somnolence, esophageal and stomach trauma or perforation, bleeding, low blood pressure, aspiration, pneumonia, infection, trauma to the teeth and death.    After a procedural time-out, the oropharynx was anesthetized and the patient was sedated by the anesthesia service. The transesophageal probe was inserted in the esophagus and stomach without difficulty and multiple views were obtained. Anesthesia was monitored by Dr. Fransisco Beau.   COMPLICATIONS:    Complications: No complications Patient tolerated procedure well.  KEY FINDINGS:  1. No LAA thrombus.  2. Normal LVEF, 60%. 3. Full Report to follow.   CARDIOVERSION:     Indications:  Symptomatic Atrial Flutter  Procedure Details:  Once the TEE was complete, the patient had the defibrillator pads placed in the anterior and posterior position. Once an appropriate level of sedation was confirmed, the patient was cardioverted x 1 with 120J of biphasic synchronized energy.  The patient converted to NSR.  There were no apparent complications.  The patient had normal neuro status and respiratory status post procedure with vitals stable as recorded elsewhere.  Adequate airway was maintained throughout and vital signs monitored per protocol.  Lake Bells T. Audie Box, [place]  [address] [place], [place] [address] [phone]  1:25 PM

## 2020-12-25 NOTE — Interval H&P Note (Signed)
History and Physical Interval Note:  12/25/2020 12:38 PM  ZOHAL RENY  has presented today for surgery, with the diagnosis of AFLUTTER.  The various methods of treatment have been discussed with the patient and family. After consideration of risks, benefits and other options for treatment, the patient has consented to  Procedure(s): TRANSESOPHAGEAL ECHOCARDIOGRAM (TEE) (N/A) CARDIOVERSION (N/A) as a surgical intervention.  The patient's history has been reviewed, patient examined, no change in status, stable for surgery.  I have reviewed the patient's chart and labs.  Questions were answered to the patient's satisfaction.     NPO for TEE/DCCV. Has been started on eliquis. In [place].   [place] Bells T. Audie Box, MD, [place]  [address] [place], [place] [address] [phone]  12:39 PM

## 2020-12-25 NOTE — Progress Notes (Signed)
 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 Foster, diastolic CHF,MGUS,HTN, debilityandrecent ED visit for loose uncomplicated sigmoid diverticulitis on 12/14/2020 returning with LLQ pain, vomiting, shortness of breath and fever. She was a started on Cipro and Flagyl 2 days prior but was unable to take due to nausea and vomiting.  In ED, slightly tachycardic and tachypneic. Significant labs include WBC 20.8, bicarb 19, AG17,Cr1.33 (1.16twodays prior),BUN 39andtotal bili 3.1.proBNP 2872. Lactic acid negative. CXR with vascular congestion and cardiomegaly. COVID-19 PCR negative. General surgery consulted. CT abdomen and pelvis,LE US and TTE ordered.  CT abdomen and pelvis showed mild uncomplicated sigmoid diverticulitis, polycystic kidney or liver disease as before and unremarkable appearance of right pelvic transplanted kidney. LE USconsistent with age-indeterminate nonocclusive DVT in the right popliteal vein not noted on previous ultrasound in 04/2020. TTE with LVEF of 70 to 75%, G2 DD, normal RVSP.VQ scan low probability for pulmonary embolism. She had hospital course complicated by A. fib/A. fib with RVR for which she was started on Cardizem drip and cardiology was consulted. Plan for TEE cardioversion on 12/25/20.  12/25/20: She was seen and examined at bedside. She has no complaints. Volume overload noted on exam. She denies any chest pain or dyspnea at rest.    Assessment/Plan: Active Problems:   GERD without esophagitis   Renal transplant recipient   Depression   Sepsis (HCC)   SOB (shortness of breath)   Acute diverticulitis   Sigmoid diverticulitis   Acute CHF (congestive heart failure) (HCC)    Bilious vomiting with nausea  #1 acute on chronic diastolic heart failure-   She takes Lasix 80 mg daily at home Her Lasix has been on hold till 12/20/2020 and then was started on Lasix 40 mg daily on 12/21/2020.  Recent echo with ejection fraction 70 to 75% and grade 2 diastolic dysfunction. Taper Solu-Medrol as tolerated.  #2 AKI on CKD stage III, improving Multifactorial, likely contributed by hypotension- History of ESRD due to polycystic kidney disease status post deceased donor transplant in 2013. Creatinine is downtrending. Avoid nephrotoxins and hypotension Monitor urine output. Continue CellCept and Prograf.  #3 severe sepsis secondary to sigmoid diverticulitis present on admission.   She met sepsis criteria on admission with tachypnea tachycardia hypotension leukocytosis and endorgan damage such as AKI.  She was initially on Zosyn changed to Unasyn in the setting of AKI.  General surgery following appreciate their input. Now on regular diet.  General surgery signed off on 12/23/2020 She is currently on Augmentin.  #4 anemia of chronic disease  Hemoglobin stable. No overt bleeding.  #5 right popliteal DVT age indeterminate nonocclusive.   VQ scan low probability for PE.  DC heparin drip 1/26 She is currently on Eliquis.  #6 acute on chronic hypoxic respiratory failure secondary to multifactorial etiology including COPD/diastolic heart failure  Patient currently on 4 L to maintain her saturation above 92%.  She is on 2 L at home prior to admission. Maintain O2 saturation greater than 90% Wean off oxygen supplementation as tolerated.  #7 essential hypertension BP is stable. Continue current management. Monitor vital signs.  #8 chronic thrombocytopenia Stable Continue to monitor closely.  #9 GERD  Continue Protonix.  #10 hypervolemic hyponatremia.   Serum sodium 128 from 131. IV Lasix on hold. Continue   to monitor.  #11  Steroid-induced hyperglycemia   Continue insulin coverage On Lantus and SSI.    #12 A flutter-heart rate in 130s to 140s.  Asymptomatic at this time On Cardizem drip Plan to have TEE/DC cardioversion on 12/25/2020. Continue Eliquis for CVA prevention.  #13 resolved hyperkalemia Serum potassium 4.8. Pressure Injury 10/23/20 Sacrum Right;Left Stage 2 -  Partial thickness loss of dermis presenting as a shallow open injury with a red, pink wound bed without slough. (Active)  10/23/20 2058  Location: Sacrum  Location Orientation: Right;Left  Staging: Stage 2 -  Partial thickness loss of dermis presenting as a shallow open injury with a red, pink wound bed without slough.  Wound Description (Comments):   Present on Admission:     Estimated body mass index is 32.7 kg/m as calculated from the following:   Height as of this encounter: 5' 1" (1.549 m).   Weight as of this encounter: 78.5 kg.  DVT prophylaxis: Eliquis Code Status: Full code   Status is: Inpatient  Dispo: The patient is from:Home              Anticipated d/c is to:Home               Anticipated d/c date is: 12/30/20               Patient currently Not stable to dc, currently on diltiazem drip.   Difficult to place patient N/A        Objective: Vitals:   12/25/20 1342 12/25/20 1351 12/25/20 1401 12/25/20 1431  BP: (!) 152/51 (!) 153/56 (!) 164/55 (!) 170/69  Pulse: (!) 57 (!) 57 (!) 59 (!) 53  Resp: 18 18 (!) 28 18  Temp:      TempSrc:      SpO2: 96% 99% 99% 100%  Weight:      Height:        Intake/Output Summary (Last 24 hours) at 12/25/2020 1510 Last data filed at 12/25/2020 1325 Gross per 24 hour  Intake 1371.04 ml  Output 600 ml  Net 771.04 ml   Filed Weights   12/23/20 1708 12/24/20 0500 12/25/20 1109  Weight: 77.8 kg 78.5 kg 78.5 kg    Exam:  . General: 65 y.o. year-old female well developed well nourished in no acute distress.  Alert and oriented x3. . Cardiovascular: Irregular rate and rhythm with no rubs or gallops.   No thyromegaly or JVD noted.   . Respiratory: Clear to auscultation with no wheezes or rales. Good inspiratory effort. . Abdomen: Soft nontender nondistended with normal bowel sounds x4 quadrants. . Musculoskeletal: 2+ pitting edema in upper and lower extremities bilaterally.  . Skin: No ulcerative lesions noted or rashes . Psychiatry: Mood is appropriate for condition and setting   Data Reviewed: CBC: Recent Labs  Lab 12/21/20 0644 12/22/20 1149 12/23/20 0510 12/24/20 0209 12/25/20 0231  WBC 4.4 3.2* 4.8 4.7 3.8*  HGB 8.6* 8.4* 8.6* 8.6* 8.4*  HCT 27.4* 26.7* 28.2* 28.0* 27.8*  MCV 100.0 100.8* 101.1* 102.2* 101.8*  PLT 111* 102* 152 155 148*   Basic Metabolic Panel: Recent Labs  Lab 12/21/20 0644 12/22/20 1149 12/23/20 0510 12/24/20 0209 12/25/20 0231  NA 123* 127* 128* 131* 128*  K 5.2* 5.1 5.0 4.7 4.8  CL 91* 95* 95* 98 96*  CO2 20* 22 22 21* 22  GLUCOSE 227* 309* 345* 230* 287*  BUN 71* 85* 81* 89* 101*  CREATININE 2.46* 2.74* 2.56* 2.52* 2.38*  CALCIUM 8.8*   9.0 9.4 9.8 9.7  MG  --   --   --   --  2.5*  PHOS  --   --   --   --  4.6   GFR: Estimated Creatinine Clearance: 23 mL/min (A) (by C-G formula based on SCr of 2.38 mg/dL (H)). Liver Function Tests: Recent Labs  Lab 12/19/20 0552 12/22/20 1149 12/23/20 0510 12/24/20 0209  AST 13* 13* 16 13*  ALT _0 ALKPHOS 50 63 67 59  BILITOT 1.1 0.7 0.8 0.9  PROT 6.8 6.8 7.0 6.5  ALBUMIN 2.6* 2.7* 2.9* 2.8*   No results for input(s): LIPASE, AMYLASE in the last 168 hours. No results for input(s): AMMONIA in the last 168 hours. Coagulation Profile: Recent Labs  Lab 12/25/20 0810  INR 1.5*   Cardiac Enzymes: No results for input(s): CKTOTAL, CKMB, CKMBINDEX, TROPONINI in the last 168 hours. BNP (last 3 results) No results for input(s): PROBNP in the last 8760 hours. HbA1C: No results for input(s): HGBA1C in the last 72 hours. CBG: Recent Labs  Lab 12/24/20 1647 12/24/20 2129  12/24/20 2320 12/25/20 0750 12/25/20 1131  GLUCAP 255* 267* 311* 245* 237*   Lipid Profile: No results for input(s): CHOL, HDL, LDLCALC, TRIG, CHOLHDL, LDLDIRECT in the last 72 hours. Thyroid Function Tests: No results for input(s): TSH, T4TOTAL, FREET4, T3FREE, THYROIDAB in the last 72 hours. Anemia Panel: No results for input(s): VITAMINB12, FOLATE, FERRITIN, TIBC, IRON, RETICCTPCT in the last 72 hours. Urine analysis:    Component Value Date/Time   COLORURINE YELLOW 12/16/2020 1400   APPEARANCEUR CLEAR 12/16/2020 1400   LABSPEC 1.014 12/16/2020 1400   PHURINE 5.0 12/16/2020 1400   GLUCOSEU NEGATIVE 12/16/2020 1400   HGBUR MODERATE (A) 12/16/2020 1400   BILIRUBINUR NEGATIVE 12/16/2020 1400   KETONESUR 20 (A) 12/16/2020 1400   PROTEINUR 100 (A) 12/16/2020 1400   UROBILINOGEN 0.2 05/15/2013 0733   NITRITE NEGATIVE 12/16/2020 1400   LEUKOCYTESUR NEGATIVE 12/16/2020 1400   Sepsis Labs: _1 (procalcitonin:4,lacticidven:4)  ) Recent Results (from the past 240 hour(s))  MRSA PCR Screening     Status: None   Collection Time: 12/23/20  5:45 PM   Specimen: Nasal Mucosa; Nasopharyngeal  Result Value Ref Range Status   MRSA by PCR NEGATIVE NEGATIVE Final    Comment:        The GeneXpert MRSA Assay (FDA approved for NASAL specimens only), is one component of a comprehensive MRSA colonization surveillance program. It is not intended to diagnose MRSA infection nor to guide or monitor treatment for MRSA infections. Performed at [facility], [place] [address], [place], [place] [address]   SARS Coronavirus 2 by RT PCR (hospital order, performed [facility]) Nasopharyngeal Nasopharyngeal Swab     Status: None   Collection Time: 12/25/20  8:30 AM   Specimen: Nasopharyngeal Swab  Result Value Ref Range Status   SARS Coronavirus 2 NEGATIVE NEGATIVE Final    Comment: (NOTE) SARS-CoV-2 target nucleic acids are NOT DETECTED.  The SARS-CoV-2  RNA is generally detectable in upper and lower respiratory specimens during the acute phase of infection. The lowest concentration of SARS-CoV-2 viral copies this assay can detect is 250 copies / mL. A negative result does not preclude SARS-CoV-2 infection and should not be used as the sole basis for treatment or other patient management decisions.  A negative result may occur with improper specimen collection / handling, submission of specimen other than nasopharyngeal swab, presence of viral mutation(s) within  the areas targeted by this assay, and inadequate number of viral copies (<250 copies / mL). A negative result must be combined with clinical observations, patient history, and epidemiological information.  Fact Sheet for Patients:   https://www.fda.gov/media/136312/download  Fact Sheet for Healthcare Providers: https://www.fda.gov/media/136313/download  This test is not yet approved or  cleared by the [place] FDA and has been authorized for detection and/or diagnosis of SARS-CoV-2 by FDA under an Emergency Use Authorization (EUA).  This EUA will remain in effect (meaning this test can be used) for the duration of the COVID-19 declaration under Section 564(b)(1) of the Act, 21 U.S.C. section 360bbb-3(b)(1), unless the authorization is terminated or revoked sooner.  Performed at [facility], [address] [place], [place] [address]       Studies: DG Chest Port 1 View  Result Date: 12/25/2020 CLINICAL DATA:  Abnormal respirations. EXAM: PORTABLE CHEST 1 VIEW COMPARISON:  Chest x-ray 12/18/2020 FINDINGS: The heart size and mediastinal contours are unchanged. Redemonstration of prominent hilar vasculature. Left base linear atelectasis. No focal consolidation within the aerated lungs. No pulmonary edema. Interval increase in trace to small volume right pleural effusion. Persistent trace left pleural effusion. No pneumothorax. No acute osseous  abnormality. IMPRESSION: 1. Slight interval increase of a right trace to small volume pleural effusion. 2. Grossly stable left trace pleural effusion. 3. Vascular congestion. Electronically Signed   By: Morgane  Naveau M.D.   On: 12/25/2020 05:48    Scheduled Meds: . [MAR Hold] allopurinol  100 mg Oral Daily  . [MAR Hold] amoxicillin-clavulanate  1 tablet Oral BID  . [MAR Hold] apixaban  5 mg Oral BID  . [MAR Hold] arformoterol  15 mcg Nebulization BID   And  . [MAR Hold] umeclidinium bromide  1 puff Inhalation Daily   And  . [MAR Hold] budesonide (PULMICORT) nebulizer solution  0.5 mg Nebulization BID  . [MAR Hold] brimonidine  1 drop Both Eyes BID  . [MAR Hold] Chlorhexidine Gluconate Cloth  6 each Topical Daily  . [MAR Hold] cycloSPORINE  1 drop Both Eyes BID  . [MAR Hold] dorzolamide  1 drop Both Eyes Daily  . [MAR Hold] ferrous sulfate  325 mg Oral Q breakfast  . [MAR Hold] FLUoxetine  40 mg Oral Daily  . [MAR Hold] furosemide  40 mg Intravenous Once  . [MAR Hold] gabapentin  200 mg Oral BID  . [MAR Hold] guaiFENesin  600 mg Oral BID  . [MAR Hold] hydrALAZINE  25 mg Oral Q8H  . [MAR Hold] insulin aspart  0-5 Units Subcutaneous QHS  . [MAR Hold] insulin aspart  0-9 Units Subcutaneous TID WC  . [MAR Hold] insulin aspart  5 Units Subcutaneous TID WC  . [MAR Hold] insulin glargine  10 Units Subcutaneous BID  . [MAR Hold] isosorbide mononitrate  30 mg Oral Daily  . [MAR Hold] latanoprost  1 drop Both Eyes QHS  . [MAR Hold] magnesium oxide  400 mg Oral BID  . [MAR Hold] mouth rinse  15 mL Mouth Rinse BID  . [MAR Hold] methylPREDNISolone (SOLU-MEDROL) injection  60 mg Intravenous Q12H  . [MAR Hold] metoprolol tartrate  50 mg Oral BID  . [MAR Hold] mycophenolate  180 mg Oral BID  . [MAR Hold] pantoprazole  40 mg Oral BID  . [MAR Hold] saccharomyces boulardii  250 mg Oral BID  . [MAR Hold] tacrolimus  1 mg Oral BID  . [MAR Hold] valACYclovir  500 mg Oral BID  . [MAR Hold]    cyanocobalamin  1,000 mcg Oral Daily    Continuous Infusions: . diltiazem (CARDIZEM) infusion 15 mg/hr (12/25/20 0900)     LOS: 9 days     Kayleen Memos, MD [facility] Pager [phone]  If 7PM-7AM, please contact night-coverage [email or web address] Password [email or web address] 12/25/2020, 3:10 PM

## 2020-12-25 NOTE — Progress Notes (Signed)
Patient ID: April [name], female   DOB: Nov 23, 1956, 65 y.o.   MRN: [id number] S: Pt is not in room and has been in vascular lab at [facility] for most of the day.  EMR reviewed O:BP (!) 170/69   Pulse (!) 53   Temp 97.9 F (36.6 C) (Temporal)   Resp 18   Ht 5\' 1"  (1.549 m)   Wt 78.5 kg   SpO2 100%   BMI 32.70 kg/m   Intake/Output Summary (Last 24 hours) at 12/25/2020 1449 Last data filed at 12/25/2020 1325 Gross per 24 hour  Intake 1371.04 ml  Output 600 ml  Net 771.04 ml   Intake/Output: I/O last 3 completed shifts: In: 861.5 [P.O.:360; I.V.:501.5] Out: 1300 [Urine:1300]  Intake/Output this shift:  Total I/O In: 866.5 [I.V.:856.5; Other:10] Out: -  Weight change:    Recent Labs  Lab 12/19/20 0552 12/20/20 0704 12/21/20 0644 12/22/20 1149 12/23/20 0510 12/24/20 0209 12/25/20 0231  NA 127* 124* 123* 127* 128* 131* 128*  K 4.2 5.3* 5.2* 5.1 5.0 4.7 4.8  CL 95* 93* 91* 95* 95* 98 96*  CO2 18* 20* 20* 22 22 21* 22  GLUCOSE 97 281* 227* 309* 345* 230* 287*  BUN 57* 62* 71* 85* 81* 89* 101*  CREATININE 2.25* 2.12* 2.46* 2.74* 2.56* 2.52* 2.38*  ALBUMIN 2.6*  --   --  2.7* 2.9* 2.8*  --   CALCIUM 8.1* 8.3* 8.8* 9.0 9.4 9.8 9.7  PHOS  --   --   --   --   --   --  4.6  AST 13*  --   --  13* 16 13*  --   ALT 10  --   --  12 15 14   --    Liver Function Tests: Recent Labs  Lab 12/22/20 1149 12/23/20 0510 12/24/20 0209  AST 13* 16 13*  ALT 12 15 14   ALKPHOS 63 67 59  BILITOT 0.7 0.8 0.9  PROT 6.8 7.0 6.5  ALBUMIN 2.7* 2.9* 2.8*   No results for input(s): LIPASE, AMYLASE in the last 168 hours. No results for input(s): AMMONIA in the last 168 hours. CBC: Recent Labs  Lab 12/21/20 0644 12/22/20 1149 12/23/20 0510 12/24/20 0209 12/25/20 0231  WBC 4.4 3.2* 4.8 4.7 3.8*  HGB 8.6* 8.4* 8.6* 8.6* 8.4*  HCT 27.4* 26.7* 28.2* 28.0* 27.8*  MCV 100.0 100.8* 101.1* 102.2* 101.8*  PLT 111* 102* 152 155 148*   Cardiac Enzymes: No results for input(s): CKTOTAL, CKMB,  CKMBINDEX, TROPONINI in the last 168 hours. CBG: Recent Labs  Lab 12/24/20 1647 12/24/20 2129 12/24/20 2320 12/25/20 0750 12/25/20 1131  GLUCAP 255* 267* 311* 245* 237*    Iron Studies: No results for input(s): IRON, TIBC, TRANSFERRIN, FERRITIN in the last 72 hours. Studies/Results: DG Chest Port 1 View  Result Date: 12/25/2020 CLINICAL DATA:  Abnormal respirations. EXAM: PORTABLE CHEST 1 VIEW COMPARISON:  Chest x-ray 12/18/2020 FINDINGS: The heart size and mediastinal contours are unchanged. Redemonstration of prominent hilar vasculature. Left base linear atelectasis. No focal consolidation within the aerated lungs. No pulmonary edema. Interval increase in trace to small volume right pleural effusion. Persistent trace left pleural effusion. No pneumothorax. No acute osseous abnormality. IMPRESSION: 1. Slight interval increase of a right trace to small volume pleural effusion. 2. Grossly stable left trace pleural effusion. 3. Vascular congestion. Electronically Signed   By: Iven Finn M.D.   On: 12/25/2020 05:48   . [MAR Hold] allopurinol  100 mg Oral Daily  . [  MAR Hold] amoxicillin-clavulanate  1 tablet Oral BID  . [MAR Hold] apixaban  5 mg Oral BID  . [MAR Hold] arformoterol  15 mcg Nebulization BID   And  . [MAR Hold] umeclidinium bromide  1 puff Inhalation Daily   And  . [MAR Hold] budesonide (PULMICORT) nebulizer solution  0.5 mg Nebulization BID  . [MAR Hold] brimonidine  1 drop Both Eyes BID  . [MAR Hold] Chlorhexidine Gluconate Cloth  6 each Topical Daily  . [MAR Hold] cycloSPORINE  1 drop Both Eyes BID  . [MAR Hold] dorzolamide  1 drop Both Eyes Daily  . [MAR Hold] ferrous sulfate  325 mg Oral Q breakfast  . [MAR Hold] FLUoxetine  40 mg Oral Daily  . [MAR Hold] furosemide  40 mg Intravenous Once  . [MAR Hold] gabapentin  200 mg Oral BID  . [MAR Hold] guaiFENesin  600 mg Oral BID  . [MAR Hold] hydrALAZINE  25 mg Oral Q8H  . [MAR Hold] insulin aspart  0-5 Units  Subcutaneous QHS  . [MAR Hold] insulin aspart  0-9 Units Subcutaneous TID WC  . [MAR Hold] insulin aspart  5 Units Subcutaneous TID WC  . [MAR Hold] insulin glargine  10 Units Subcutaneous BID  . [MAR Hold] isosorbide mononitrate  30 mg Oral Daily  . [MAR Hold] latanoprost  1 drop Both Eyes QHS  . [MAR Hold] magnesium oxide  400 mg Oral BID  . [MAR Hold] mouth rinse  15 mL Mouth Rinse BID  . [MAR Hold] methylPREDNISolone (SOLU-MEDROL) injection  60 mg Intravenous Q12H  . [MAR Hold] metoprolol tartrate  50 mg Oral BID  . [MAR Hold] mycophenolate  180 mg Oral BID  . [MAR Hold] pantoprazole  40 mg Oral BID  . [MAR Hold] saccharomyces boulardii  250 mg Oral BID  . [MAR Hold] tacrolimus  1 mg Oral BID  . [MAR Hold] valACYclovir  500 mg Oral BID  . [MAR Hold] cyanocobalamin  1,000 mcg Oral Daily    BMET    Component Value Date/Time   NA 128 (L) 12/25/2020 0231   K 4.8 12/25/2020 0231   CL 96 (L) 12/25/2020 0231   CO2 22 12/25/2020 0231   GLUCOSE 287 (H) 12/25/2020 0231   BUN 101 (H) 12/25/2020 0231   CREATININE 2.38 (H) 12/25/2020 0231   CREATININE 1.28 (H) 11/04/2020 0952   CALCIUM 9.7 12/25/2020 0231   CALCIUM 10.8 (H) 10/13/2011 1405   GFRNONAA 22 (L) 12/25/2020 0231   GFRNONAA 47 (L) 11/04/2020 0952   GFRAA 51 (L) 08/05/2020 1523   CBC    Component Value Date/Time   WBC 3.8 (L) 12/25/2020 0231   RBC 2.73 (L) 12/25/2020 0231   HGB 8.4 (L) 12/25/2020 0231   HGB 9.0 (L) 11/04/2020 0952   HGB 10.6 (L) 12/29/2010 1522   HCT 27.8 (L) 12/25/2020 0231   HCT 31.2 (L) 12/29/2010 1522   PLT 148 (L) 12/25/2020 0231   PLT 241 11/04/2020 0952   PLT 205 12/29/2010 1522   MCV 101.8 (H) 12/25/2020 0231   MCV 94 12/29/2010 1522   MCH 30.8 12/25/2020 0231   MCHC 30.2 12/25/2020 0231   RDW 17.4 (H) 12/25/2020 0231   RDW 12.8 12/29/2010 1522   LYMPHSABS 1.7 12/17/2020 0425   LYMPHSABS 1.1 12/29/2010 1522   MONOABS 1.0 12/17/2020 0425   EOSABS 0.1 12/17/2020 0425   EOSABS 0.2  12/29/2010 1522   BASOSABS 0.0 12/17/2020 0425   BASOSABS 0.0 12/29/2010 1522  Assessment/Plan:  1. AKI/CKD stage 3a- multifactorial, in setting of intravascular volume depletion from poor po intake as well as N/V as well as hypotension from sepsis/hypoperfusion and 3rd spacing from acute on chronic diastolic CHF.  Her Scr was improving until IV lasix was started on 12/21/20.  Scr peaked at 2.71 but has slowly been improving.  Difficult situation as she does have peripheral edema but remains hypotensive and likely intravascularly depleted.   1. Will give another dose of IV albumin and follow.   2. Would limit goal diuresis of 1 liter negative per day as BP tolerates. 3. Scr improved but BUN up and sodium down.  Continue to follow. 2. Atrial flutter with RVR- started on heparin gtt and possible TEE/DCCV today.  Cardiology following and pt on diltiazem drip. 3. Hypotension- initially improved but now with RVR has dropped BP's again.  Continue to follow 4. Acute colitis- improving with abx.  Surgery following.  5. Acute on chronic diastolic CHF- responded to IV lasix.  Hold further dosing for now and will give IV albumin to help with 3rd spacing. 6. S/p kidney transplant-  Continue prograf/pred/myfortic.  Check prograf level 7. Hyponatremia- due to CHF/edema.  Improved with IV lasix. 8. COPD- per PCCM 9. Deconditioning- very weak and will likely require PT/OT evaluation when stable.    Donetta Potts, MD [employer] [phone]

## 2020-12-25 NOTE — Anesthesia Preprocedure Evaluation (Addendum)
Anesthesia Evaluation  Patient identified by MRN, date of birth, ID bandGeneral Assessment Comment: Sedated, but arousable   Reviewed: Allergy & Precautions, NPO status , Patient's Chart, lab work & pertinent test results  History of Anesthesia Complications (+) PONV and history of anesthetic complications  Airway Mallampati: III  TM Distance: >3 FB Neck ROM: Full    Dental  (+) Dental Advisory Given   Pulmonary asthma , COPD,  oxygen dependent, former smoker,     + decreased breath sounds unstable     Cardiovascular hypertension, +CHF  + dysrhythmias Atrial Fibrillation  Rhythm:Irregular Rate:Normal   '22 TTE - EF 70 to 75%. Grade II diastolic dysfunction (pseudonormalization).     Neuro/Psych PSYCHIATRIC DISORDERS Anxiety Depression negative neurological ROS     GI/Hepatic Neg liver ROS, GERD  Medicated and Controlled,  Endo/Other   Obesity Na 128 (chronic hyponatremia) Cl 96   Renal/GU Renal disease (PKD) S/p renal transplant      Musculoskeletal  (+) Arthritis ,   Abdominal   Peds  Hematology  (+) anemia ,  Plt 148k INR 1.5    Anesthesia Other Findings Covid test negative   Reproductive/Obstetrics                           Anesthesia Physical Anesthesia Plan  ASA: IV  Anesthesia Plan: General and MAC   Post-op Pain Management:    Induction: Intravenous  PONV Risk Score and Plan: 4 or greater and Treatment may vary due to age or medical condition and Propofol infusion  Airway Management Planned: Natural Airway and Nasal Cannula  Additional Equipment: None  Intra-op Plan:   Post-operative Plan:   Informed Consent: I have reviewed the patients History and Physical, chart, labs and discussed the procedure including the risks, benefits and alternatives for the proposed anesthesia with the patient or authorized representative who has indicated his/her understanding and  acceptance.       Plan Discussed with: CRNA, Anesthesiologist and Surgeon  Anesthesia Plan Comments: (Discussion had with cardiologist and patient about patient's tenuous respiratory and mental status, will proceed with understanding that we may have to abort procedure and reschedule if she does not tolerate sedation )      Anesthesia Quick Evaluation

## 2020-12-25 NOTE — Progress Notes (Signed)
  Echocardiogram Echocardiogram Transesophageal has been performed.  Randa Lynn Kamalani Mastro 12/25/2020, 2:48 PM

## 2020-12-26 ENCOUNTER — Encounter (HOSPITAL_COMMUNITY): Payer: Self-pay | Admitting: Cardiovascular Disease

## 2020-12-26 DIAGNOSIS — R0602 Shortness of breath: Secondary | ICD-10-CM | POA: Diagnosis not present

## 2020-12-26 DIAGNOSIS — I483 Typical atrial flutter: Secondary | ICD-10-CM | POA: Diagnosis not present

## 2020-12-26 DIAGNOSIS — I5031 Acute diastolic (congestive) heart failure: Secondary | ICD-10-CM | POA: Diagnosis not present

## 2020-12-26 LAB — CBC
HCT: 29.3 % — ABNORMAL LOW (ref 36.0–46.0)
Hemoglobin: 8.8 g/dL — ABNORMAL LOW (ref 12.0–15.0)
MCH: 31.3 pg (ref 26.0–34.0)
MCHC: 30 g/dL (ref 30.0–36.0)
MCV: 104.3 fL — ABNORMAL HIGH (ref 80.0–100.0)
Platelets: 148 10*3/uL — ABNORMAL LOW (ref 150–400)
RBC: 2.81 MIL/uL — ABNORMAL LOW (ref 3.87–5.11)
RDW: 17.5 % — ABNORMAL HIGH (ref 11.5–15.5)
WBC: 4 10*3/uL (ref 4.0–10.5)
nRBC: 0.5 % — ABNORMAL HIGH (ref 0.0–0.2)

## 2020-12-26 LAB — GLUCOSE, CAPILLARY
Glucose-Capillary: 211 mg/dL — ABNORMAL HIGH (ref 70–99)
Glucose-Capillary: 280 mg/dL — ABNORMAL HIGH (ref 70–99)
Glucose-Capillary: 233 mg/dL — ABNORMAL HIGH (ref 70–99)
Glucose-Capillary: 211 mg/dL — ABNORMAL HIGH (ref 70–99)

## 2020-12-26 LAB — BASIC METABOLIC PANEL
Anion gap: 6 (ref 5–15)
BUN: 102 mg/dL — ABNORMAL HIGH (ref 8–23)
CO2: 25 mmol/L (ref 22–32)
Calcium: 9.8 mg/dL (ref 8.9–10.3)
Chloride: 98 mmol/L (ref 98–111)
Creatinine, Ser: 2.34 mg/dL — ABNORMAL HIGH (ref 0.44–1.00)
GFR, Estimated: 23 mL/min — ABNORMAL LOW (ref >60)
Glucose, Bld: 247 mg/dL — ABNORMAL HIGH (ref 70–99)
Potassium: 5.1 mmol/L (ref 3.5–5.1)
Sodium: 129 mmol/L — ABNORMAL LOW (ref 135–145)

## 2020-12-26 LAB — TSH: TSH: 0.385 u[IU]/mL (ref 0.350–4.500)

## 2020-12-26 LAB — OSMOLALITY: Osmolality: 336 mOsm/kg (ref 275–295)

## 2020-12-26 LAB — OSMOLALITY, URINE: Osmolality, Ur: 442 mOsm/kg (ref 300–900)

## 2020-12-26 LAB — SODIUM, URINE, RANDOM: Sodium, Ur: 14 mmol/L

## 2020-12-26 LAB — CREATININE, URINE, RANDOM: Creatinine, Urine: 56.44 mg/dL

## 2020-12-26 LAB — CORTISOL: Cortisol, Plasma: 3.2 ug/dL

## 2020-12-26 MED ORDER — METOPROLOL TARTRATE 25 MG PO TABS
100.0000 mg | ORAL_TABLET | Freq: Two times a day (BID) | ORAL | Status: DC
  Administered 2020-12-26 – 2020-12-28 (×4): 100 mg via ORAL
  Filled 2020-12-26 (×5): qty 2
  Filled 2020-12-26: qty 4

## 2020-12-26 MED ORDER — HYDRALAZINE HCL 50 MG PO TABS
50.0000 mg | ORAL_TABLET | Freq: Three times a day (TID) | ORAL | Status: DC
  Administered 2020-12-26 – 2020-12-27 (×2): 50 mg via ORAL
  Filled 2020-12-26 (×2): qty 1

## 2020-12-26 MED ORDER — ALBUMIN HUMAN 25 % IV SOLN
12.5000 g | Freq: Once | INTRAVENOUS | Status: AC
  Administered 2020-12-26: 12.5 g via INTRAVENOUS
  Filled 2020-12-26: qty 50

## 2020-12-26 NOTE — Plan of Care (Signed)

## 2020-12-26 NOTE — Progress Notes (Signed)
Progress Note  Patient Name: April [name] Date of Encounter: 12/26/2020  [facility] Cardiologist: Sanda Klein, MD   Subjective   Feeling tired.  Otherwise well.   Inpatient Medications    Scheduled Meds: . allopurinol  100 mg Oral Daily  . amoxicillin-clavulanate  1 tablet Oral BID  . apixaban  5 mg Oral BID  . arformoterol  15 mcg Nebulization BID   And  . umeclidinium bromide  1 puff Inhalation Daily   And  . budesonide (PULMICORT) nebulizer solution  0.5 mg Nebulization BID  . brimonidine  1 drop Both Eyes BID  . Chlorhexidine Gluconate Cloth  6 each Topical Daily  . cycloSPORINE  1 drop Both Eyes BID  . dorzolamide  1 drop Both Eyes Daily  . ferrous sulfate  325 mg Oral Q breakfast  . FLUoxetine  40 mg Oral Daily  . furosemide  40 mg Intravenous Once  . gabapentin  200 mg Oral BID  . guaiFENesin  600 mg Oral BID  . hydrALAZINE  50 mg Oral Q8H  . insulin aspart  0-5 Units Subcutaneous QHS  . insulin aspart  0-9 Units Subcutaneous TID WC  . insulin aspart  5 Units Subcutaneous TID WC  . insulin glargine  10 Units Subcutaneous BID  . isosorbide mononitrate  30 mg Oral Daily  . latanoprost  1 drop Both Eyes QHS  . magnesium oxide  400 mg Oral BID  . mouth rinse  15 mL Mouth Rinse BID  . methylPREDNISolone (SOLU-MEDROL) injection  60 mg Intravenous Q12H  . metoprolol tartrate  100 mg Oral BID  . mycophenolate  180 mg Oral BID  . pantoprazole  40 mg Oral BID  . saccharomyces boulardii  250 mg Oral BID  . tacrolimus  1 mg Oral BID  . valACYclovir  500 mg Oral BID  . cyanocobalamin  1,000 mcg Oral Daily   Continuous Infusions: . albumin human     PRN Meds: acetaminophen **OR** acetaminophen, ALPRAZolam, bisacodyl, ipratropium-albuterol, metoprolol tartrate, ondansetron **OR** ondansetron (ZOFRAN) IV   Vital Signs    Vitals:   12/26/20 0500 12/26/20 0546 12/26/20 0750 12/26/20 1052  BP:  (!) 141/66  (!) 160/64  Pulse:  65  69  Resp:    20  Temp:   97.6 F (36.4 C)  (!) 97.4 F (36.3 C)  TempSrc:    Oral  SpO2:  100% 96% 98%  Weight: 78.6 kg     Height: 5\' 1"  (1.549 m)       Intake/Output Summary (Last 24 hours) at 12/26/2020 1607 Last data filed at 12/26/2020 1606 Gross per 24 hour  Intake 630 ml  Output 1375 ml  Net -745 ml   Last 3 Weights 12/26/2020 12/25/2020 12/25/2020  Weight (lbs) 173 lb 4.5 oz 174 lb 6.1 oz 173 lb 1 oz  Weight (kg) 78.6 kg 79.1 kg 78.5 kg      Telemetry    Sinus rhythm.  PACs, PVCs - Personally Reviewed  ECG    No new tracings - Personally Reviewed  Physical Exam   VS:  BP (!) 160/64 (BP Location: Left Leg)   Pulse 69   Temp (!) 97.4 F (36.3 C) (Oral)   Resp 20   Ht 5\' 1"  (1.549 m)   Wt 78.6 kg   SpO2 98%   BMI 32.74 kg/m  , BMI Body mass index is 32.74 kg/m. GENERAL:  Well-appearing.  No acute distress.  HEENT: Pupils equal round and reactive,  fundi not visualized, oral mucosa unremarkable NECK:  No jugular venous distention, waveform within normal limits, carotid upstroke brisk and symmetric, no bruits LUNGS:  Clear to auscultation bilaterally HEART: RRR.  PMI not displaced or sustained,S1 and S2 within normal limits, no S3, no S4, no clicks, no rubs, no murmurs ABD:  Flat, positive bowel sounds normal in frequency in pitch, no bruits, no rebound, no guarding, no midline pulsatile mass, no hepatomegaly, no splenomegaly EXT:  2 plus pulses throughout,2+ LE extremity edema, no cyanosis no clubbing SKIN:  No rashes no nodules NEURO:  Cranial nerves II through XII grossly intact, motor grossly intact throughout PSYCH:  Cognitively intact, oriented to person place and time   Labs    High Sensitivity Troponin:   Recent Labs  Lab 12/18/20 1042 12/18/20 1357 12/22/20 1643  TROPONINIHS 11 14 5       Chemistry Recent Labs  Lab 12/22/20 1149 12/23/20 0510 12/24/20 0209 12/25/20 0231 12/26/20 0427  NA 127* 128* 131* 128* 129*  K 5.1 5.0 4.7 4.8 5.1  CL 95* 95* 98 96* 98  CO2 22  22 21* 22 25  GLUCOSE 309* 345* 230* 287* 247*  BUN 85* 81* 89* 101* 102*  CREATININE 2.74* 2.56* 2.52* 2.38* 2.34*  CALCIUM 9.0 9.4 9.8 9.7 9.8  PROT 6.8 7.0 6.5  --   --   ALBUMIN 2.7* 2.9* 2.8*  --   --   AST 13* 16 13*  --   --   ALT 12 15 14   --   --   ALKPHOS 63 67 59  --   --   BILITOT 0.7 0.8 0.9  --   --   GFRNONAA 19* 20* 21* 22* 23*  ANIONGAP 10 11 12 10 6      Hematology Recent Labs  Lab 12/24/20 0209 12/25/20 0231 12/26/20 0427  WBC 4.7 3.8* 4.0  RBC 2.74* 2.73* 2.81*  HGB 8.6* 8.4* 8.8*  HCT 28.0* 27.8* 29.3*  MCV 102.2* 101.8* 104.3*  MCH 31.4 30.8 31.3  MCHC 30.7 30.2 30.0  RDW 17.6* 17.4* 17.5*  PLT 155 148* 148*    BNP Recent Labs  Lab 12/24/20 1303  BNP 1,624.9*     DDimer No results for input(s): DDIMER in the last 168 hours.   Radiology    DG Chest Port 1 View  Result Date: 12/25/2020 CLINICAL DATA:  Abnormal respirations. EXAM: PORTABLE CHEST 1 VIEW COMPARISON:  Chest x-ray 12/18/2020 FINDINGS: The heart size and mediastinal contours are unchanged. Redemonstration of prominent hilar vasculature. Left base linear atelectasis. No focal consolidation within the aerated lungs. No pulmonary edema. Interval increase in trace to small volume right pleural effusion. Persistent trace left pleural effusion. No pneumothorax. No acute osseous abnormality. IMPRESSION: 1. Slight interval increase of a right trace to small volume pleural effusion. 2. Grossly stable left trace pleural effusion. 3. Vascular congestion. Electronically Signed   By: Iven Finn M.D.   On: 12/25/2020 05:48   ECHO TEE  Result Date: 12/25/2020    TRANSESOPHOGEAL ECHO REPORT   Patient Name:   April [name] Date of Exam: 12/25/2020 Medical Rec #:  [id number]         Height:       61.0 in Accession #:    [id number]        Weight:       173.1 lb Date of Birth:  1956/09/01         BSA:  1.776 m Patient Age:    65 years          BP:           127/89 mmHg Patient Gender: F                  HR:           116 bpm. Exam Location:  Inpatient Procedure: 3D Echo, Transesophageal Echo, Cardiac Doppler and Color Doppler Indications:     I48.0 Paroxysmal atrial fibrillation  History:         Patient has prior history of Echocardiogram examinations, most                  recent 12/17/2020. CHF, COPD; Risk Factors:Hypertension,                  Dyslipidemia and GERD.  Sonographer:     Avner Stroder Dance Referring Phys:  [id number] Abigail Butts Diagnosing Phys: Eleonore Chiquito MD PROCEDURE: After discussion of the risks and benefits of a TEE, an informed consent was obtained from the patient. TEE procedure time was 18 minutes. The transesophogeal probe was passed without difficulty through the esophogus of the patient. Imaged were obtained with the patient in a left lateral decubitus position. Local oropharyngeal anesthetic was provided with Cetacaine. Sedation performed by different physician. The patient was monitored while under deep sedation. Anesthestetic sedation was provided intravenously by Anesthesiology: 173mg  of Propofol. Image quality was excellent. The patient's vital signs; including heart rate, blood pressure, and oxygen saturation; remained stable throughout the procedure. The patient developed no complications during the procedure. A successful direct current cardioversion was performed at 120 joules with 1 attempt. IMPRESSIONS  1. Atrial flutter. No LA/LAA thrombus. Successful DCCV x 1 with 120 J and return to NSR.  2. Left ventricular ejection fraction, by estimation, is 55 to 60%. The left ventricle has normal function.  3. Right ventricular systolic function is normal. The right ventricular size is normal.  4. No left atrial/left atrial appendage thrombus was detected. The LAA emptying velocity was 75 cm/s.  5. The mitral valve is normal in structure. Mild mitral valve regurgitation. No evidence of mitral stenosis.  6. Tricuspid valve regurgitation is mild to moderate.  7. The aortic valve is  tricuspid. Aortic valve regurgitation is not visualized. No aortic stenosis is present.  8. There is mild (Grade II) layered plaque involving the descending aorta. FINDINGS  Left Ventricle: Left ventricular ejection fraction, by estimation, is 55 to 60%. The left ventricle has normal function. The left ventricular internal cavity size was normal in size. Right Ventricle: The right ventricular size is normal. No increase in right ventricular wall thickness. Right ventricular systolic function is normal. Left Atrium: Left atrial size was normal in size. No left atrial/left atrial appendage thrombus was detected. The LAA emptying velocity was 75 cm/s. Right Atrium: Right atrial size was normal in size. Pericardium: There is no evidence of pericardial effusion. Presence of pericardial fat pad. Mitral Valve: The mitral valve is normal in structure. Mild mitral valve regurgitation. No evidence of mitral valve stenosis. Tricuspid Valve: The tricuspid valve is normal in structure. Tricuspid valve regurgitation is mild to moderate. No evidence of tricuspid stenosis. Aortic Valve: The aortic valve is tricuspid. Aortic valve regurgitation is not visualized. No aortic stenosis is present. Pulmonic Valve: The pulmonic valve was grossly normal. Pulmonic valve regurgitation is not visualized. No evidence of pulmonic stenosis. Aorta: The aortic root and ascending aorta are structurally  normal, with no evidence of dilitation. There is mild (Grade II) layered plaque involving the descending aorta. Venous: The left upper pulmonary vein, right upper pulmonary vein, left lower pulmonary vein and right lower pulmonary vein are normal. IAS/Shunts: The atrial septum is grossly normal.   AORTA Ao Root diam: 3.68 cm Ao Asc diam:  3.47 cm TRICUSPID VALVE TR Peak grad:   35.0 mmHg TR Vmax:        296.00 cm/s Eleonore Chiquito MD Electronically signed by Eleonore Chiquito MD Signature Date/Time: 12/25/2020/3:31:25 PM    Final     Cardiac Studies    ECHO 12/17/20  IMPRESSIONS   1. Left ventricular ejection fraction, by estimation, is 70 to 75%. The  left ventricle has hyperdynamic function. The left ventricle has no  regional wall motion abnormalities. Left ventricular diastolic parameters  are consistent with Grade II diastolic  dysfunction (pseudonormalization). Elevated left atrial pressure.  2. Right ventricular systolic function is normal. The right ventricular  size is normal. There is normal pulmonary artery systolic pressure.  3. The mitral valve is normal in structure. No evidence of mitral valve  regurgitation. No evidence of mitral stenosis.  4. The aortic valve is tricuspid. Aortic valve regurgitation is not  visualized. No aortic stenosis is present.  5. The inferior vena cava is normal in size with greater than 50%  respiratory variability, suggesting right atrial pressure of 3 mmHg.   Patient Profile     65 y.o. female with a hx of HTN, GERD, COPD, MGUS, HFpEF,SVT 01/2020,ESRD with renal transplant 2013 on immunosuppressants followed at [facility] and admitted 1.24.22 with diverticulitis after failing outpt therapy.  Initially followed by cardiology for hypotension, however now with new onset atrial flutter with RVR.   Assessment & Plan    # Atrial flutter with RVR:  New onset this admission.  Underwent successful TEE/DCCV on 12/25/2020.  She is maintaining sinus rhythm.  Continue Eliquis and metoprolol.  # Hypotension:  # Hypertension: Hypotension resolved.  Patient was initially hypotensive in the setting of sepsis and atrial flutter with RVR.  Today blood pressures are quite elevated.We will increase her hydralazine to 50 mg every 8 hours.  Continue Imdur and increase metoprolol 200 mg twice daily.  # Acute colitis: initially failed outpatient antibiotics due to persistent N/V/D.  Continue management per primary team and surgery  # Acute on chronic diastolic CHF:  # AoCKD in patient with hx of renal  transplant: Volume status is difficult.  Home lasix initially held due to [employer] She was restarted on lasix 40mg  daily 12/21/20. Cr peaked at 2.74.  Volume status is difficult as she has intravascular volume depletion receiving albumin.  She does have significant lower extremity edema.  Appreciate nephrology input.  Will defer diuretic management to them.  # History of SVT: episode occurred 01/2020 in the setting of an acute asthma exacerbation.  Increase metoprolol as above.  # Raynaud's: suspect BBlocker has been avoided due to history of raynaud's and asthma.  Continue imdur and consider switching metoprolol back to verapamil.         For questions or updates, please contact [employer] Please consult [email or web address] for contact info under       Signed, Skeet Latch, MD  12/26/2020, 4:07 PM

## 2020-12-26 NOTE — Progress Notes (Signed)
Patient ID: April [name], female   DOB: 1955/12/23, 65 y.o.   MRN: [id number] S: Underwent successful DCCV yesterday.  Just complains of weakness O:BP (!) 160/64 (BP Location: Left Leg)   Pulse 69   Temp (!) 97.4 F (36.3 C) (Oral)   Resp 20   Ht 5\' 1"  (1.549 m)   Wt 78.6 kg   SpO2 98%   BMI 32.74 kg/m   Intake/Output Summary (Last 24 hours) at 12/26/2020 1327 Last data filed at 12/26/2020 0500 Gross per 24 hour  Intake 630 ml  Output 1100 ml  Net -470 ml   Intake/Output: I/O last 3 completed shifts: In: 8144 [P.O.:630; I.V.:1181; Other:10] Out: 1700 [Urine:1700]  Intake/Output this shift:  No intake/output data recorded. Weight change:  Gen: NAD CVS: RRR Resp: cta Abd: benign Ext: 1+ presacral edema  Recent Labs  Lab 12/20/20 0704 12/21/20 0644 12/22/20 1149 12/23/20 0510 12/24/20 0209 12/25/20 0231 12/26/20 0427  NA 124* 123* 127* 128* 131* 128* 129*  K 5.3* 5.2* 5.1 5.0 4.7 4.8 5.1  CL 93* 91* 95* 95* 98 96* 98  CO2 20* 20* 22 22 21* 22 25  GLUCOSE 281* 227* 309* 345* 230* 287* 247*  BUN 62* 71* 85* 81* 89* 101* 102*  CREATININE 2.12* 2.46* 2.74* 2.56* 2.52* 2.38* 2.34*  ALBUMIN  --   --  2.7* 2.9* 2.8*  --   --   CALCIUM 8.3* 8.8* 9.0 9.4 9.8 9.7 9.8  PHOS  --   --   --   --   --  4.6  --   AST  --   --  13* 16 13*  --   --   ALT  --   --  12 15 14   --   --    Liver Function Tests: Recent Labs  Lab 12/22/20 1149 12/23/20 0510 12/24/20 0209  AST 13* 16 13*  ALT 12 15 14   ALKPHOS 63 67 59  BILITOT 0.7 0.8 0.9  PROT 6.8 7.0 6.5  ALBUMIN 2.7* 2.9* 2.8*   No results for input(s): LIPASE, AMYLASE in the last 168 hours. No results for input(s): AMMONIA in the last 168 hours. CBC: Recent Labs  Lab 12/22/20 1149 12/23/20 0510 12/24/20 0209 12/25/20 0231 12/26/20 0427  WBC 3.2* 4.8 4.7 3.8* 4.0  HGB 8.4* 8.6* 8.6* 8.4* 8.8*  HCT 26.7* 28.2* 28.0* 27.8* 29.3*  MCV 100.8* 101.1* 102.2* 101.8* 104.3*  PLT 102* 152 155 148* 148*   Cardiac  Enzymes: No results for input(s): CKTOTAL, CKMB, CKMBINDEX, TROPONINI in the last 168 hours. CBG: Recent Labs  Lab 12/25/20 1131 12/25/20 1619 12/25/20 2308 12/26/20 0745 12/26/20 1139  GLUCAP 237* 253* 285* 211* 280*    Iron Studies: No results for input(s): IRON, TIBC, TRANSFERRIN, FERRITIN in the last 72 hours. Studies/Results: DG Chest Port 1 View  Result Date: 12/25/2020 CLINICAL DATA:  Abnormal respirations. EXAM: PORTABLE CHEST 1 VIEW COMPARISON:  Chest x-ray 12/18/2020 FINDINGS: The heart size and mediastinal contours are unchanged. Redemonstration of prominent hilar vasculature. Left base linear atelectasis. No focal consolidation within the aerated lungs. No pulmonary edema. Interval increase in trace to small volume right pleural effusion. Persistent trace left pleural effusion. No pneumothorax. No acute osseous abnormality. IMPRESSION: 1. Slight interval increase of a right trace to small volume pleural effusion. 2. Grossly stable left trace pleural effusion. 3. Vascular congestion. Electronically Signed   By: Iven Finn M.D.   On: 12/25/2020 05:48   ECHO TEE  Result  Date: 12/25/2020    TRANSESOPHOGEAL ECHO REPORT   Patient Name:   April [name] Date of Exam: 12/25/2020 Medical Rec #:  [id number]         Height:       61.0 in Accession #:    [id number]        Weight:       173.1 lb Date of Birth:  Dec 23, 1955         BSA:          1.776 m Patient Age:    2 years          BP:           127/89 mmHg Patient Gender: F                 HR:           116 bpm. Exam Location:  Inpatient Procedure: 3D Echo, Transesophageal Echo, Cardiac Doppler and Color Doppler Indications:     I48.0 Paroxysmal atrial fibrillation  History:         Patient has prior history of Echocardiogram examinations, most                  recent 12/17/2020. CHF, COPD; Risk Factors:Hypertension,                  Dyslipidemia and GERD.  Sonographer:     Tiffany Dance Referring Phys:  [id number] Abigail Butts Diagnosing  Phys: Eleonore Chiquito MD PROCEDURE: After discussion of the risks and benefits of a TEE, an informed consent was obtained from the patient. TEE procedure time was 18 minutes. The transesophogeal probe was passed without difficulty through the esophogus of the patient. Imaged were obtained with the patient in a left lateral decubitus position. Local oropharyngeal anesthetic was provided with Cetacaine. Sedation performed by different physician. The patient was monitored while under deep sedation. Anesthestetic sedation was provided intravenously by Anesthesiology: 173mg  of Propofol. Image quality was excellent. The patient's vital signs; including heart rate, blood pressure, and oxygen saturation; remained stable throughout the procedure. The patient developed no complications during the procedure. A successful direct current cardioversion was performed at 120 joules with 1 attempt. IMPRESSIONS  1. Atrial flutter. No LA/LAA thrombus. Successful DCCV x 1 with 120 J and return to NSR.  2. Left ventricular ejection fraction, by estimation, is 55 to 60%. The left ventricle has normal function.  3. Right ventricular systolic function is normal. The right ventricular size is normal.  4. No left atrial/left atrial appendage thrombus was detected. The LAA emptying velocity was 75 cm/s.  5. The mitral valve is normal in structure. Mild mitral valve regurgitation. No evidence of mitral stenosis.  6. Tricuspid valve regurgitation is mild to moderate.  7. The aortic valve is tricuspid. Aortic valve regurgitation is not visualized. No aortic stenosis is present.  8. There is mild (Grade II) layered plaque involving the descending aorta. FINDINGS  Left Ventricle: Left ventricular ejection fraction, by estimation, is 55 to 60%. The left ventricle has normal function. The left ventricular internal cavity size was normal in size. Right Ventricle: The right ventricular size is normal. No increase in right ventricular wall thickness.  Right ventricular systolic function is normal. Left Atrium: Left atrial size was normal in size. No left atrial/left atrial appendage thrombus was detected. The LAA emptying velocity was 75 cm/s. Right Atrium: Right atrial size was normal in size. Pericardium: There is no evidence of pericardial effusion. Presence of pericardial  fat pad. Mitral Valve: The mitral valve is normal in structure. Mild mitral valve regurgitation. No evidence of mitral valve stenosis. Tricuspid Valve: The tricuspid valve is normal in structure. Tricuspid valve regurgitation is mild to moderate. No evidence of tricuspid stenosis. Aortic Valve: The aortic valve is tricuspid. Aortic valve regurgitation is not visualized. No aortic stenosis is present. Pulmonic Valve: The pulmonic valve was grossly normal. Pulmonic valve regurgitation is not visualized. No evidence of pulmonic stenosis. Aorta: The aortic root and ascending aorta are structurally normal, with no evidence of dilitation. There is mild (Grade II) layered plaque involving the descending aorta. Venous: The left upper pulmonary vein, right upper pulmonary vein, left lower pulmonary vein and right lower pulmonary vein are normal. IAS/Shunts: The atrial septum is grossly normal.   AORTA Ao Root diam: 3.68 cm Ao Asc diam:  3.47 cm TRICUSPID VALVE TR Peak grad:   35.0 mmHg TR Vmax:        296.00 cm/s Eleonore Chiquito MD Electronically signed by Eleonore Chiquito MD Signature Date/Time: 12/25/2020/3:31:25 PM    Final    . allopurinol  100 mg Oral Daily  . amoxicillin-clavulanate  1 tablet Oral BID  . apixaban  5 mg Oral BID  . arformoterol  15 mcg Nebulization BID   And  . umeclidinium bromide  1 puff Inhalation Daily   And  . budesonide (PULMICORT) nebulizer solution  0.5 mg Nebulization BID  . brimonidine  1 drop Both Eyes BID  . Chlorhexidine Gluconate Cloth  6 each Topical Daily  . cycloSPORINE  1 drop Both Eyes BID  . dorzolamide  1 drop Both Eyes Daily  . ferrous sulfate  325  mg Oral Q breakfast  . FLUoxetine  40 mg Oral Daily  . furosemide  40 mg Intravenous Once  . gabapentin  200 mg Oral BID  . guaiFENesin  600 mg Oral BID  . hydrALAZINE  25 mg Oral Q8H  . insulin aspart  0-5 Units Subcutaneous QHS  . insulin aspart  0-9 Units Subcutaneous TID WC  . insulin aspart  5 Units Subcutaneous TID WC  . insulin glargine  10 Units Subcutaneous BID  . isosorbide mononitrate  30 mg Oral Daily  . latanoprost  1 drop Both Eyes QHS  . magnesium oxide  400 mg Oral BID  . mouth rinse  15 mL Mouth Rinse BID  . methylPREDNISolone (SOLU-MEDROL) injection  60 mg Intravenous Q12H  . metoprolol tartrate  50 mg Oral BID  . mycophenolate  180 mg Oral BID  . pantoprazole  40 mg Oral BID  . saccharomyces boulardii  250 mg Oral BID  . tacrolimus  1 mg Oral BID  . valACYclovir  500 mg Oral BID  . cyanocobalamin  1,000 mcg Oral Daily    BMET    Component Value Date/Time   NA 129 (L) 12/26/2020 0427   K 5.1 12/26/2020 0427   CL 98 12/26/2020 0427   CO2 25 12/26/2020 0427   GLUCOSE 247 (H) 12/26/2020 0427   BUN 102 (H) 12/26/2020 0427   CREATININE 2.34 (H) 12/26/2020 0427   CREATININE 1.28 (H) 11/04/2020 0952   CALCIUM 9.8 12/26/2020 0427   CALCIUM 10.8 (H) 10/13/2011 1405   GFRNONAA 23 (L) 12/26/2020 0427   GFRNONAA 47 (L) 11/04/2020 0952   GFRAA 51 (L) 08/05/2020 1523   CBC    Component Value Date/Time   WBC 4.0 12/26/2020 0427   RBC 2.81 (L) 12/26/2020 0427   HGB 8.8 (L) 12/26/2020  0427   HGB 9.0 (L) 11/04/2020 0952   HGB 10.6 (L) 12/29/2010 1522   HCT 29.3 (L) 12/26/2020 0427   HCT 31.2 (L) 12/29/2010 1522   PLT 148 (L) 12/26/2020 0427   PLT 241 11/04/2020 0952   PLT 205 12/29/2010 1522   MCV 104.3 (H) 12/26/2020 0427   MCV 94 12/29/2010 1522   MCH 31.3 12/26/2020 0427   MCHC 30.0 12/26/2020 0427   RDW 17.5 (H) 12/26/2020 0427   RDW 12.8 12/29/2010 1522   LYMPHSABS 1.7 12/17/2020 0425   LYMPHSABS 1.1 12/29/2010 1522   MONOABS 1.0 12/17/2020 0425    EOSABS 0.1 12/17/2020 0425   EOSABS 0.2 12/29/2010 1522   BASOSABS 0.0 12/17/2020 0425   BASOSABS 0.0 12/29/2010 1522     Assessment/Plan:  1. AKI/CKD stage 3a- multifactorial, in setting of intravascular volume depletion from poor po intake as well as N/V as well as hypotension from sepsis/hypoperfusion and 3rd spacing from acute on chronic diastolic CHF. Her Scr was improving until IV lasix was started on 12/21/20. Scr peaked at 2.71 but has slowly been improving. Difficult situation as she does have peripheral edema but remains hypotensive and likely intravascularly depleted.  1. Would limit goal diuresis of 1 liter negative per day as BP tolerates. 2. Scr slowly improving but BUN up and sodium down.  Continue to follow. 3. Low sodium, high K, ? Adrenal insufficiency but on IV solumedrol.  Labs ordered. 2. Atrial flutter with RVR- started on heparin gtt and s/p successful DCCV 12/25/20.  Cardiology following and pt now on metoprolol 3. Hypotension-  Continue to follow 4. Acute colitis- improving with abx. Surgery following.  5. Acute on chronic diastolic CHF- responded to IV lasix. Hold further dosing for now and will give IV albumin to help with 3rd spacing. 6. S/p kidney transplant- Continue prograf/pred/myfortic. Check prograf level 7. Hyponatremia- due to CHF/edema. Improved with IV lasix. 8. COPD- per PCCM 9. Deconditioning- very weak and will likely require PT/OT evaluation when stable.  Donetta Potts, MD [employer] [phone]

## 2020-12-26 NOTE — Progress Notes (Signed)
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 Royce Macadamia, diastolic CHF,MGUS,HTN, debilityandrecent ED visit for loose uncomplicated sigmoid diverticulitis on 12/14/2020 returning with LLQ pain, vomiting, shortness of breath and fever. She was a started on Cipro and Flagyl 2 days prior but was unable to take due to nausea and vomiting.  In ED, slightly tachycardic and tachypneic. Significant labs include WBC 20.8, bicarb 19, AG17,Cr1.33 (1.16twodays prior),BUN 39andtotal bili 3.1.proBNP 01/07/71. Lactic acid negative. CXR with vascular congestion and cardiomegaly. COVID-19 PCR negative. General surgery consulted. CT abdomen and pelvis,LE [place] and TTE ordered.  CT abdomen and pelvis showed mild uncomplicated sigmoid diverticulitis, polycystic kidney or liver disease as before and unremarkable appearance of right pelvic transplanted kidney. LE USconsistent with age-indeterminate nonocclusive DVT in the right popliteal vein not noted on previous ultrasound in 04/2020. TTE with LVEF of 70 to 75%, G2 DD, normal RVSP.VQ scan low probability for pulmonary embolism. She had hospital course complicated by A. fib/A. fib with RVR for which she was started on Cardizem drip and cardiology was consulted. Plan for TEE cardioversion on 12/25/20.  12/26/20: Has poor appetite and working on increasing her oral protein calorie intake.  Assessment/Plan: Active Problems:   GERD without esophagitis   Renal transplant recipient   Depression   Sepsis ([place])   SOB (shortness of breath)   Acute diverticulitis   Sigmoid diverticulitis   Acute CHF (congestive heart failure) (HCC)   Bilious vomiting with nausea  #1 acute on chronic diastolic heart  failure-   She takes Lasix 80 mg daily at home Her Lasix has been on hold till 12/20/2020 and then was started on Lasix 40 mg daily on 12/21/2020.  Recent echo with ejection fraction 70 to 75% and grade 2 diastolic dysfunction. Taper Solu-Medrol as tolerated.  #2 AKI on CKD stage III, improving Multifactorial, likely contributed by hypotension- History of ESRD due to polycystic kidney disease status post deceased donor transplant in 01-08-12. Creatinine is downtrending. Avoid nephrotoxins and hypotension Monitor urine output. Continue CellCept and Prograf.  #3 severe sepsis secondary to sigmoid diverticulitis present on admission.   She met sepsis criteria on admission with tachypnea tachycardia hypotension leukocytosis and endorgan damage such as AKI.  She was initially on Zosyn changed to Unasyn in the setting of AKI.  General surgery following appreciate their input. Now on regular diet.  General surgery signed off on 12/23/2020 She is currently on Augmentin.  #4 anemia of chronic disease  Hemoglobin stable. No overt bleeding.  #5 right popliteal DVT age indeterminate nonocclusive.   VQ scan low probability for PE.  DC heparin drip 1/26 She is currently on Eliquis.  #6 acute on chronic hypoxic respiratory failure secondary to multifactorial etiology including COPD/diastolic heart failure  Patient currently on 4 L to maintain her saturation above 92%.  She is on 2 L at home prior to admission. Maintain O2 saturation greater than 90% Wean off oxygen supplementation as tolerated.  #7 essential hypertension BP is stable. Continue current management. Monitor vital signs.  #8 chronic thrombocytopenia Stable Continue to monitor closely.  #9 GERD  Continue Protonix.  #10 hypervolemic hyponatremia.   Serum sodium 128 from 131. IV Lasix on hold. Continue to monitor.  #11  Steroid-induced hyperglycemia  Continue insulin coverage On Lantus and SSI.    #  12  Resolved post  cardioversion: A flutter-heart rate in 130s to 140s.  Previously on Cardizem drip Completed TEE/DC cardioversion on 12/25/2020. Continue Eliquis for CVA prevention.  #13 resolved hyperkalemia Serum potassium 4.8. Pressure Injury 10/23/20 Sacrum Right;Left Stage 2 -  Partial thickness loss of dermis presenting as a shallow open injury with a red, pink wound bed without slough. (Active)  10/23/20 2058  Location: Sacrum  Location Orientation: Right;Left  Staging: Stage 2 -  Partial thickness loss of dermis presenting as a shallow open injury with a red, pink wound bed without slough.  Wound Description (Comments):   Present on Admission:     Estimated body mass index is 32.7 kg/m as calculated from the following:   Height as of this encounter: _0  (1.549 m).   Weight as of this encounter: 78.5 kg.  Moderate protein calorie malnutrition Albumin 2.8 Moderate muscle mass loss Encouraged to increase oral protein calorie intake.  DVT prophylaxis: Eliquis Code Status: Full code   Status is: Inpatient  Dispo: The patient is from:Home              Anticipated d/c is AJ:OINO               Anticipated d/c date is: 12/30/20               Patient currently Not stable to dc, currently on diltiazem drip.   Difficult to place patient N/A        Objective: Vitals:   12/26/20 0500 12/26/20 0546 12/26/20 0750 12/26/20 1052  BP:  (!) 141/66  (!) 160/64  Pulse:  65  69  Resp:    20  Temp:  97.6 F (36.4 C)  (!) 97.4 F (36.3 C)  TempSrc:    Oral  SpO2:  100% 96% 98%  Weight: 78.6 kg     Height: _1  (1.549 m)       Intake/Output Summary (Last 24 hours) at 12/26/2020 1834 Last data filed at 12/26/2020 1744 Gross per 24 hour  Intake 950 ml  Output 1325 ml  Net -375 ml   Filed Weights   12/25/20 1109 12/25/20 2121 12/26/20 0500  Weight: 78.5 kg 79.1 kg 78.6 kg    Exam:  . General: 65 y.o. year-old female chronically ill-appearing in no acute distress.  Alert and oriented  x3. . Cardiovascular: Regular rate and rhythm no rubs or gallops. [name] Respiratory: Clear to auscultation no wheezes or rales.   . Abdomen: Soft nontender bowel sounds present. . Musculoskeletal: 2+ pitting edema in upper and lower extremities bilaterally.   . Skin: No ulcerative lesions noted or rashes . Psychiatry: Mood is appropriate for condition and setting.  Data Reviewed: CBC: Recent Labs  Lab 12/22/20 1149 12/23/20 0510 12/24/20 0209 12/25/20 0231 12/26/20 0427  WBC 3.2* 4.8 4.7 3.8* 4.0  HGB 8.4* 8.6* 8.6* 8.4* 8.8*  HCT 26.7* 28.2* 28.0* 27.8* 29.3*  MCV 100.8* 101.1* 102.2* 101.8* 104.3*  PLT 102* 152 155 148* 676*   Basic Metabolic Panel: Recent Labs  Lab 12/22/20 1149 12/23/20 0510 12/24/20 0209 12/25/20 0231 12/26/20 0427  NA 127* 128* 131* 128* 129*  K 5.1 5.0 4.7 4.8 5.1  CL 95* 95* 98 96* 98  CO2 22 22 21* 22 25  GLUCOSE 309* 345* 230* 287* 247*  BUN 85* 81* 89* 101* 102*  CREATININE 2.74* 2.56* 2.52* 2.38* 2.34*  CALCIUM 9.0 9.4 9.8 9.7 9.8  MG  --   --   --  2.5*  --   PHOS  --   --   --  4.6  --    GFR: Estimated Creatinine Clearance: 23.3 mL/min (A) (by C-G formula based on SCr of 2.34 mg/dL (H)). Liver Function Tests: Recent Labs  Lab 12/22/20 1149 12/23/20 0510 12/24/20 0209  AST 13* 16 13*  ALT _0 ALKPHOS 63 67 59  BILITOT 0.7 0.8 0.9  PROT 6.8 7.0 6.5  ALBUMIN 2.7* 2.9* 2.8*   No results for input(s): LIPASE, AMYLASE in the last 168 hours. No results for input(s): AMMONIA in the last 168 hours. Coagulation Profile: Recent Labs  Lab 12/25/20 0810  INR 1.5*   Cardiac Enzymes: No results for input(s): CKTOTAL, CKMB, CKMBINDEX, TROPONINI in the last 168 hours. BNP (last 3 results) No results for input(s): PROBNP in the last 8760 hours. HbA1C: No results for input(s): HGBA1C in the last 72 hours. CBG: Recent Labs  Lab 12/25/20 1619 12/25/20 2308 12/26/20 0745 12/26/20 1139 12/26/20 1605  GLUCAP 253* 285* 211* 280*  233*   Lipid Profile: No results for input(s): CHOL, HDL, LDLCALC, TRIG, CHOLHDL, LDLDIRECT in the last 72 hours. Thyroid Function Tests: Recent Labs    12/26/20 0944  TSH 0.385   Anemia Panel: No results for input(s): VITAMINB12, FOLATE, FERRITIN, TIBC, IRON, RETICCTPCT in the last 72 hours. Urine analysis:    Component Value Date/Time   COLORURINE YELLOW 12/24/2020 1700   APPEARANCEUR CLEAR 12/24/2020 1700   LABSPEC 1.015 12/24/2020 1700   PHURINE 5.0 12/24/2020 1700   GLUCOSEU 50 (A) 12/24/2020 1700   HGBUR NEGATIVE 12/24/2020 1700   BILIRUBINUR NEGATIVE 12/24/2020 1700   KETONESUR NEGATIVE 12/24/2020 1700   PROTEINUR NEGATIVE 12/24/2020 1700   UROBILINOGEN 0.2 05/15/2013 0733   NITRITE NEGATIVE 12/24/2020 1700   LEUKOCYTESUR NEGATIVE 12/24/2020 1700   Sepsis Labs: _1 (procalcitonin:4,lacticidven:4)  ) Recent Results (from the past 240 hour(s))  MRSA PCR Screening     Status: None   Collection Time: 12/23/20  5:45 PM   Specimen: Nasal Mucosa; Nasopharyngeal  Result Value Ref Range Status   MRSA by PCR NEGATIVE NEGATIVE Final    Comment:        The GeneXpert MRSA Assay (FDA approved for NASAL specimens only), is one component of a comprehensive MRSA colonization surveillance program. It is not intended to diagnose MRSA infection nor to guide or monitor treatment for MRSA infections. Performed at [facility], [place] [address], [place], [place] [address]   SARS Coronavirus 2 by RT PCR (hospital order, performed in [facility] hospital lab) Nasopharyngeal Nasopharyngeal Swab     Status: None   Collection Time: 12/25/20  8:30 AM   Specimen: Nasopharyngeal Swab  Result Value Ref Range Status   SARS Coronavirus 2 NEGATIVE NEGATIVE Final    Comment: (NOTE) SARS-CoV-2 target nucleic acids are NOT DETECTED.  The SARS-CoV-2 RNA is generally detectable in upper and lower respiratory specimens during the acute phase of infection. The  lowest concentration of SARS-CoV-2 viral copies this assay can detect is 250 copies / mL. A negative result does not preclude SARS-CoV-2 infection and should not be used as the sole basis for treatment or other patient management decisions.  A negative result may occur with improper specimen collection / handling, submission of specimen other than nasopharyngeal swab, presence of viral mutation(s) within the areas targeted by this assay, and inadequate number of viral copies (<250 copies / mL). A negative result must be combined with clinical observations, patient history,  and epidemiological information.  Fact Sheet for Patients:   StrictlyIdeas.no  Fact Sheet for Healthcare Providers: BankingDealers.co.za  This test is not yet approved or  cleared by the [place] FDA and has been authorized for detection and/or diagnosis of SARS-CoV-2 by FDA under an Emergency Use Authorization (EUA).  This EUA will remain in effect (meaning this test can be used) for the duration of the COVID-19 declaration under Section 564(b)(1) of the Act, 21 U.S.C. section 360bbb-3(b)(1), unless the authorization is terminated or revoked sooner.  Performed at [facility], [place] [address], [place], [place] [address]       Studies: No results found.  Scheduled Meds: . allopurinol  100 mg Oral Daily  . amoxicillin-clavulanate  1 tablet Oral BID  . apixaban  5 mg Oral BID  . arformoterol  15 mcg Nebulization BID   And  . umeclidinium bromide  1 puff Inhalation Daily   And  . budesonide (PULMICORT) nebulizer solution  0.5 mg Nebulization BID  . brimonidine  1 drop Both Eyes BID  . Chlorhexidine Gluconate Cloth  6 each Topical Daily  . cycloSPORINE  1 drop Both Eyes BID  . dorzolamide  1 drop Both Eyes Daily  . ferrous sulfate  325 mg Oral Q breakfast  . FLUoxetine  40 mg Oral Daily  . furosemide  40 mg Intravenous Once  .  gabapentin  200 mg Oral BID  . guaiFENesin  600 mg Oral BID  . hydrALAZINE  50 mg Oral Q8H  . insulin aspart  0-5 Units Subcutaneous QHS  . insulin aspart  0-9 Units Subcutaneous TID WC  . insulin aspart  5 Units Subcutaneous TID WC  . insulin glargine  10 Units Subcutaneous BID  . isosorbide mononitrate  30 mg Oral Daily  . latanoprost  1 drop Both Eyes QHS  . magnesium oxide  400 mg Oral BID  . mouth rinse  15 mL Mouth Rinse BID  . methylPREDNISolone (SOLU-MEDROL) injection  60 mg Intravenous Q12H  . metoprolol tartrate  100 mg Oral BID  . mycophenolate  180 mg Oral BID  . pantoprazole  40 mg Oral BID  . saccharomyces boulardii  250 mg Oral BID  . tacrolimus  1 mg Oral BID  . valACYclovir  500 mg Oral BID  . cyanocobalamin  1,000 mcg Oral Daily    Continuous Infusions:    LOS: 10 days     Kayleen Memos, MD [facility] Pager [phone]  If 7PM-7AM, please contact night-coverage [email or web address] Password [facility] 12/26/2020, 6:34 PM

## 2020-12-27 DIAGNOSIS — I10 Essential (primary) hypertension: Secondary | ICD-10-CM

## 2020-12-27 DIAGNOSIS — Z940 Kidney transplant status: Secondary | ICD-10-CM | POA: Diagnosis not present

## 2020-12-27 DIAGNOSIS — I5031 Acute diastolic (congestive) heart failure: Secondary | ICD-10-CM | POA: Diagnosis not present

## 2020-12-27 LAB — CBC
HCT: 29.1 % — ABNORMAL LOW (ref 36.0–46.0)
Hemoglobin: 9 g/dL — ABNORMAL LOW (ref 12.0–15.0)
MCH: 31.6 pg (ref 26.0–34.0)
MCHC: 30.9 g/dL (ref 30.0–36.0)
MCV: 102.1 fL — ABNORMAL HIGH (ref 80.0–100.0)
Platelets: 121 10*3/uL — ABNORMAL LOW (ref 150–400)
RBC: 2.85 MIL/uL — ABNORMAL LOW (ref 3.87–5.11)
RDW: 17.5 % — ABNORMAL HIGH (ref 11.5–15.5)
WBC: 5.4 10*3/uL (ref 4.0–10.5)
nRBC: 0 % (ref 0.0–0.2)

## 2020-12-27 LAB — BASIC METABOLIC PANEL
Anion gap: 11 (ref 5–15)
BUN: 101 mg/dL — ABNORMAL HIGH (ref 8–23)
CO2: 23 mmol/L (ref 22–32)
Calcium: 9.9 mg/dL (ref 8.9–10.3)
Chloride: 99 mmol/L (ref 98–111)
Creatinine, Ser: 2.07 mg/dL — ABNORMAL HIGH (ref 0.44–1.00)
GFR, Estimated: 26 mL/min — ABNORMAL LOW (ref >60)
Glucose, Bld: 281 mg/dL — ABNORMAL HIGH (ref 70–99)
Potassium: 5.3 mmol/L — ABNORMAL HIGH (ref 3.5–5.1)
Sodium: 133 mmol/L — ABNORMAL LOW (ref 135–145)

## 2020-12-27 LAB — GLUCOSE, CAPILLARY
Glucose-Capillary: 252 mg/dL — ABNORMAL HIGH (ref 70–99)
Glucose-Capillary: 196 mg/dL — ABNORMAL HIGH (ref 70–99)
Glucose-Capillary: 257 mg/dL — ABNORMAL HIGH (ref 70–99)
Glucose-Capillary: 253 mg/dL — ABNORMAL HIGH (ref 70–99)

## 2020-12-27 LAB — MAGNESIUM: Magnesium: 2.5 mg/dL — ABNORMAL HIGH (ref 1.7–2.4)

## 2020-12-27 MED ORDER — METOPROLOL TARTRATE 5 MG/5ML IV SOLN
2.5000 mg | Freq: Four times a day (QID) | INTRAVENOUS | Status: DC | PRN
  Administered 2020-12-27 – 2020-12-28 (×2): 2.5 mg via INTRAVENOUS
  Filled 2020-12-27 (×2): qty 5

## 2020-12-27 MED ORDER — PREDNISONE 5 MG PO TABS
7.5000 mg | ORAL_TABLET | Freq: Every day | ORAL | Status: DC
  Administered 2020-12-28 – 2020-12-29 (×2): 7.5 mg via ORAL
  Filled 2020-12-27 (×3): qty 2

## 2020-12-27 MED ORDER — POLYETHYLENE GLYCOL 3350 17 G PO PACK
17.0000 g | PACK | Freq: Every day | ORAL | Status: DC
  Administered 2020-12-28: 17 g via ORAL
  Filled 2020-12-27 (×3): qty 1

## 2020-12-27 MED ORDER — INSULIN GLARGINE 100 UNIT/ML ~~LOC~~ SOLN
13.0000 [IU] | Freq: Two times a day (BID) | SUBCUTANEOUS | Status: DC
  Administered 2020-12-27 – 2020-12-29 (×4): 13 [IU] via SUBCUTANEOUS
  Filled 2020-12-27 (×8): qty 0.13

## 2020-12-27 MED ORDER — AMLODIPINE BESYLATE 5 MG PO TABS
5.0000 mg | ORAL_TABLET | Freq: Once | ORAL | Status: AC
  Administered 2020-12-27: 5 mg via ORAL
  Filled 2020-12-27: qty 1

## 2020-12-27 MED ORDER — AMLODIPINE BESYLATE 10 MG PO TABS: 10.0000 mg | ORAL_TABLET | Freq: Every day | ORAL | Status: DC

## 2020-12-27 MED ORDER — HYDRALAZINE HCL 50 MG PO TABS
100.0000 mg | ORAL_TABLET | Freq: Three times a day (TID) | ORAL | Status: DC
  Administered 2020-12-27 – 2020-12-29 (×6): 100 mg via ORAL
  Filled 2020-12-27 (×6): qty 2

## 2020-12-27 MED ORDER — AMLODIPINE BESYLATE 5 MG PO TABS
5.0000 mg | ORAL_TABLET | Freq: Every day | ORAL | Status: DC
  Administered 2020-12-27: 5 mg via ORAL
  Filled 2020-12-27: qty 1

## 2020-12-27 MED ORDER — INSULIN ASPART 100 UNIT/ML ~~LOC~~ SOLN
8.0000 [IU] | Freq: Three times a day (TID) | SUBCUTANEOUS | Status: DC
  Administered 2020-12-27 – 2020-12-29 (×6): 8 [IU] via SUBCUTANEOUS

## 2020-12-27 MED ORDER — METHYLPREDNISOLONE SODIUM SUCC 125 MG IJ SOLR
40.0000 mg | INTRAMUSCULAR | Status: AC
  Administered 2020-12-27: 40 mg via INTRAVENOUS
  Filled 2020-12-27: qty 2

## 2020-12-27 MED ORDER — HYDRALAZINE HCL 50 MG PO TABS
50.0000 mg | ORAL_TABLET | Freq: Once | ORAL | Status: AC
  Administered 2020-12-27: 50 mg via ORAL
  Filled 2020-12-27: qty 1

## 2020-12-27 MED ORDER — SENNOSIDES-DOCUSATE SODIUM 8.6-50 MG PO TABS
2.0000 | ORAL_TABLET | Freq: Two times a day (BID) | ORAL | Status: DC
  Filled 2020-12-27 (×5): qty 2

## 2020-12-27 MED ORDER — VERAPAMIL HCL ER 180 MG PO TBCR
360.0000 mg | EXTENDED_RELEASE_TABLET | Freq: Every day | ORAL | Status: DC
  Administered 2020-12-27: 360 mg via ORAL
  Filled 2020-12-27 (×2): qty 2

## 2020-12-27 MED ORDER — FUROSEMIDE 10 MG/ML IJ SOLN
40.0000 mg | Freq: Once | INTRAMUSCULAR | Status: AC
  Administered 2020-12-27: 40 mg via INTRAVENOUS
  Filled 2020-12-27: qty 4

## 2020-12-27 NOTE — Progress Notes (Signed)
Progress Note  Patient Name: April [name] Date of Encounter: 12/27/2020  [facility] Cardiologist: Sanda Klein, MD   Subjective   Feeling well.  No complaints.   Inpatient Medications    Scheduled Meds: . allopurinol  100 mg Oral Daily  . amLODipine  5 mg Oral Daily  . amoxicillin-clavulanate  1 tablet Oral BID  . apixaban  5 mg Oral BID  . arformoterol  15 mcg Nebulization BID   And  . umeclidinium bromide  1 puff Inhalation Daily   And  . budesonide (PULMICORT) nebulizer solution  0.5 mg Nebulization BID  . brimonidine  1 drop Both Eyes BID  . Chlorhexidine Gluconate Cloth  6 each Topical Daily  . cycloSPORINE  1 drop Both Eyes BID  . dorzolamide  1 drop Both Eyes Daily  . ferrous sulfate  325 mg Oral Q breakfast  . FLUoxetine  40 mg Oral Daily  . gabapentin  200 mg Oral BID  . guaiFENesin  600 mg Oral BID  . hydrALAZINE  100 mg Oral Q8H  . hydrALAZINE  50 mg Oral Once  . insulin aspart  0-5 Units Subcutaneous QHS  . insulin aspart  0-9 Units Subcutaneous TID WC  . insulin aspart  8 Units Subcutaneous TID WC  . insulin glargine  13 Units Subcutaneous BID  . isosorbide mononitrate  30 mg Oral Daily  . latanoprost  1 drop Both Eyes QHS  . mouth rinse  15 mL Mouth Rinse BID  . methylPREDNISolone (SOLU-MEDROL) injection  40 mg Intravenous Q24H  . metoprolol tartrate  100 mg Oral BID  . mycophenolate  180 mg Oral BID  . pantoprazole  40 mg Oral BID  . saccharomyces boulardii  250 mg Oral BID  . tacrolimus  1 mg Oral BID  . valACYclovir  500 mg Oral BID  . cyanocobalamin  1,000 mcg Oral Daily   Continuous Infusions:  PRN Meds: acetaminophen **OR** acetaminophen, ALPRAZolam, bisacodyl, ipratropium-albuterol, metoprolol tartrate, ondansetron **OR** ondansetron (ZOFRAN) IV   Vital Signs    Vitals:   12/27/20 0515 12/27/20 0541 12/27/20 0853 12/27/20 0909  BP: (!) 197/64 (!) 194/60 (!) 182/68   Pulse: 72 72 69   Resp: 20     Temp: 98 F (36.7 C)      TempSrc: Oral     SpO2: 99%  93% 93%  Weight: 78.4 kg     Height:        Intake/Output Summary (Last 24 hours) at 12/27/2020 1141 Last data filed at 12/27/2020 1137 Gross per 24 hour  Intake 1570 ml  Output 1175 ml  Net 395 ml   Last 3 Weights 12/27/2020 12/26/2020 12/25/2020  Weight (lbs) 172 lb 13.5 oz 173 lb 4.5 oz 174 lb 6.1 oz  Weight (kg) 78.4 kg 78.6 kg 79.1 kg      Telemetry    Sinus rhythm.  PACs, PVCs - Personally Reviewed  ECG    No new tracings - Personally Reviewed  Physical Exam   VS:  BP (!) 182/68 (BP Location: Left Leg)   Pulse 69   Temp 98 F (36.7 C) (Oral)   Resp 20   Ht 5\' 1"  (1.549 m)   Wt 78.4 kg   SpO2 93%   BMI 32.66 kg/m  , BMI Body mass index is 32.66 kg/m. GENERAL:  Well-appearing.  No acute distress.  HEENT: Pupils equal round and reactive, fundi not visualized, oral mucosa unremarkable NECK:  No jugular venous distention, waveform within  normal limits, carotid upstroke brisk and symmetric, no bruits LUNGS:  Clear to auscultation bilaterally HEART: RRR.  PMI not displaced or sustained,S1 and S2 within normal limits, no S3, no S4, no clicks, no rubs, no murmurs ABD:  Flat, positive bowel sounds normal in frequency in pitch, no bruits, no rebound, no guarding, no midline pulsatile mass, no hepatomegaly, no splenomegaly EXT:  2 plus pulses throughout,2+ LE extremity edema, no cyanosis no clubbing SKIN:  No rashes no nodules NEURO:  Cranial nerves II through XII grossly intact, motor grossly intact throughout PSYCH:  Cognitively intact, oriented to person place and time   Labs    High Sensitivity Troponin:   Recent Labs  Lab 12/18/20 1042 12/18/20 1357 12/22/20 1643  TROPONINIHS 11 14 5       Chemistry Recent Labs  Lab 12/22/20 1149 12/23/20 0510 12/24/20 0209 12/25/20 0231 12/26/20 0427 12/27/20 0636  NA 127* 128* 131* 128* 129* 133*  K 5.1 5.0 4.7 4.8 5.1 5.3*  CL 95* 95* 98 96* 98 99  CO2 22 22 21* 22 25 23   GLUCOSE 309*  345* 230* 287* 247* 281*  BUN 85* 81* 89* 101* 102* 101*  CREATININE 2.74* 2.56* 2.52* 2.38* 2.34* 2.07*  CALCIUM 9.0 9.4 9.8 9.7 9.8 9.9  PROT 6.8 7.0 6.5  --   --   --   ALBUMIN 2.7* 2.9* 2.8*  --   --   --   AST 13* 16 13*  --   --   --   ALT 12 15 14   --   --   --   ALKPHOS 63 67 59  --   --   --   BILITOT 0.7 0.8 0.9  --   --   --   GFRNONAA 19* 20* 21* 22* 23* 26*  ANIONGAP 10 11 12 10 6 11      Hematology Recent Labs  Lab 12/25/20 0231 12/26/20 0427 12/27/20 0426  WBC 3.8* 4.0 5.4  RBC 2.73* 2.81* 2.85*  HGB 8.4* 8.8* 9.0*  HCT 27.8* 29.3* 29.1*  MCV 101.8* 104.3* 102.1*  MCH 30.8 31.3 31.6  MCHC 30.2 30.0 30.9  RDW 17.4* 17.5* 17.5*  PLT 148* 148* 121*    BNP Recent Labs  Lab 12/24/20 1303  BNP 1,624.9*     DDimer No results for input(s): DDIMER in the last 168 hours.   Radiology    ECHO TEE  Result Date: 12/25/2020    TRANSESOPHOGEAL ECHO REPORT   Patient Name:   April [name] Date of Exam: 12/25/2020 Medical Rec #:  [id number]         Height:       61.0 in Accession #:    [id number]        Weight:       173.1 lb Date of Birth:  02-07-1956         BSA:          1.776 m Patient Age:    65 years          BP:           127/89 mmHg Patient Gender: F                 HR:           116 bpm. Exam Location:  Inpatient Procedure: 3D Echo, Transesophageal Echo, Cardiac Doppler and Color Doppler Indications:     I48.0 Paroxysmal atrial fibrillation  History:  Patient has prior history of Echocardiogram examinations, most                  recent 12/17/2020. CHF, COPD; Risk Factors:Hypertension,                  Dyslipidemia and GERD.  Sonographer:     Taegan Standage Dance Referring Phys:  [id number] Abigail Butts Diagnosing Phys: Eleonore Chiquito MD PROCEDURE: After discussion of the risks and benefits of a TEE, an informed consent was obtained from the patient. TEE procedure time was 18 minutes. The transesophogeal probe was passed without difficulty through the esophogus of the  patient. Imaged were obtained with the patient in a left lateral decubitus position. Local oropharyngeal anesthetic was provided with Cetacaine. Sedation performed by different physician. The patient was monitored while under deep sedation. Anesthestetic sedation was provided intravenously by Anesthesiology: 173mg  of Propofol. Image quality was excellent. The patient's vital signs; including heart rate, blood pressure, and oxygen saturation; remained stable throughout the procedure. The patient developed no complications during the procedure. A successful direct current cardioversion was performed at 120 joules with 1 attempt. IMPRESSIONS  1. Atrial flutter. No LA/LAA thrombus. Successful DCCV x 1 with 120 J and return to NSR.  2. Left ventricular ejection fraction, by estimation, is 55 to 60%. The left ventricle has normal function.  3. Right ventricular systolic function is normal. The right ventricular size is normal.  4. No left atrial/left atrial appendage thrombus was detected. The LAA emptying velocity was 75 cm/s.  5. The mitral valve is normal in structure. Mild mitral valve regurgitation. No evidence of mitral stenosis.  6. Tricuspid valve regurgitation is mild to moderate.  7. The aortic valve is tricuspid. Aortic valve regurgitation is not visualized. No aortic stenosis is present.  8. There is mild (Grade II) layered plaque involving the descending aorta. FINDINGS  Left Ventricle: Left ventricular ejection fraction, by estimation, is 55 to 60%. The left ventricle has normal function. The left ventricular internal cavity size was normal in size. Right Ventricle: The right ventricular size is normal. No increase in right ventricular wall thickness. Right ventricular systolic function is normal. Left Atrium: Left atrial size was normal in size. No left atrial/left atrial appendage thrombus was detected. The LAA emptying velocity was 75 cm/s. Right Atrium: Right atrial size was normal in size. Pericardium:  There is no evidence of pericardial effusion. Presence of pericardial fat pad. Mitral Valve: The mitral valve is normal in structure. Mild mitral valve regurgitation. No evidence of mitral valve stenosis. Tricuspid Valve: The tricuspid valve is normal in structure. Tricuspid valve regurgitation is mild to moderate. No evidence of tricuspid stenosis. Aortic Valve: The aortic valve is tricuspid. Aortic valve regurgitation is not visualized. No aortic stenosis is present. Pulmonic Valve: The pulmonic valve was grossly normal. Pulmonic valve regurgitation is not visualized. No evidence of pulmonic stenosis. Aorta: The aortic root and ascending aorta are structurally normal, with no evidence of dilitation. There is mild (Grade II) layered plaque involving the descending aorta. Venous: The left upper pulmonary vein, right upper pulmonary vein, left lower pulmonary vein and right lower pulmonary vein are normal. IAS/Shunts: The atrial septum is grossly normal.   AORTA Ao Root diam: 3.68 cm Ao Asc diam:  3.47 cm TRICUSPID VALVE TR Peak grad:   35.0 mmHg TR Vmax:        296.00 cm/s Eleonore Chiquito MD Electronically signed by Eleonore Chiquito MD Signature Date/Time: 12/25/2020/3:31:25 PM    Final  Cardiac Studies   ECHO 12/17/20  IMPRESSIONS   1. Left ventricular ejection fraction, by estimation, is 70 to 75%. The  left ventricle has hyperdynamic function. The left ventricle has no  regional wall motion abnormalities. Left ventricular diastolic parameters  are consistent with Grade II diastolic  dysfunction (pseudonormalization). Elevated left atrial pressure.  2. Right ventricular systolic function is normal. The right ventricular  size is normal. There is normal pulmonary artery systolic pressure.  3. The mitral valve is normal in structure. No evidence of mitral valve  regurgitation. No evidence of mitral stenosis.  4. The aortic valve is tricuspid. Aortic valve regurgitation is not  visualized. No  aortic stenosis is present.  5. The inferior vena cava is normal in size with greater than 50%  respiratory variability, suggesting right atrial pressure of 3 mmHg.   Patient Profile     65 y.o. female with a hx of HTN, GERD, COPD, MGUS, HFpEF,SVT 01/2020,ESRD with renal transplant 2013 on immunosuppressants followed at [facility] and admitted 1.24.22 with diverticulitis after failing outpt therapy.  Initially followed by cardiology for hypotension, however now with new onset atrial flutter with RVR.   Assessment & Plan    # Atrial flutter with RVR:  New onset this admission.  Underwent successful TEE/DCCV on 12/25/2020.  She is maintaining sinus rhythm.  Continue Eliquis and metoprolol.  # Hypotension:  # Hypertension: Hypotension resolved.  Patient was initially hypotensive in the setting of sepsis and atrial flutter with RVR.  Today blood pressures remains elevated despite increasing hydralazine and metoprolol yesterday.  We'll increase hydralazine to 100 mg.  Hours.  We'll also add amlodipine 5 mg daily.  Blood pressure remains elevated tomorrow will switch metoprolol to carvedilol.    # Acute colitis:  Initially failed outpatient antibiotics due to persistent N/V/D.  Continue management per primary team and surgery  # Acute on chronic diastolic CHF:  # AoCKD in patient with hx of renal transplant: Volume status is difficult.  Home lasix initially held due to [facility]. She was restarted on lasix 40mg  daily 12/21/20. Cr peaked at 2.74.  Volume status is difficult as she has intravascular volume depletion receiving albumin.  She does have significant lower extremity edema.  Appreciate nephrology input.  Will defer diuretic management to them.  # History of SVT: episode occurred 01/2020 in the setting of an acute asthma exacerbation.  Increase metoprolol as above.  # Raynaud's: suspect BBlocker has been avoided due to history of raynaud's and asthma.  Continue imdur and consider switching metoprolol  back to verapamil.         For questions or updates, please contact [place] Please consult [email or web address] for contact info under       Signed, Skeet Latch, MD  12/27/2020, 11:41 AM

## 2020-12-27 NOTE — Progress Notes (Signed)
Nephrology progress note  Patient ID: April [name], female   DOB: 13-Sep-1956, 65 y.o.   MRN: [id number]   S: she's been through a lot.  On solumedrol taper.  States that home pred dose is 7.5 mg daily   Review of systems:  Nausea this AM Some shortness of breath but states happy is off of oxygen  Denies cp Just got lunch and starting to eat some   O:BP (!) 182/68 (BP Location: Left Leg)   Pulse 69   Temp 98 F (36.7 C) (Oral)   Resp 20   Ht 5\' 1"  (1.549 m)   Wt 78.4 kg   SpO2 93%   BMI 32.66 kg/m   Intake/Output Summary (Last 24 hours) at 12/27/2020 1126 Last data filed at 12/26/2020 2300 Gross per 24 hour  Intake 1050 ml  Output 1175 ml  Net -125 ml   Intake/Output: I/O last 3 completed shifts: In: 1200 [P.O.:1200] Out: 2225 [Urine:2225]  Intake/Output this shift:  No intake/output data recorded. Weight change: -0.1 kg  General adult female in bed in no acute distress HEENT normocephalic atraumatic extraocular movements intact sclera anicteric Neck supple trachea midline Lungs clear to auscultation bilaterally normal work of breathing at rest but increased with exertion Heart S1S2 no rub Abdomen soft nontender nondistended/obese habitus Extremities 1+ edema  Psych normal mood and affect Neuro - alert and oriented x 3 provides hx and follows commands  Recent Labs  Lab 12/21/20 0644 12/22/20 1149 12/23/20 0510 12/24/20 0209 12/25/20 0231 12/26/20 0427 12/27/20 0636  NA 123* 127* 128* 131* 128* 129* 133*  K 5.2* 5.1 5.0 4.7 4.8 5.1 5.3*  CL 91* 95* 95* 98 96* 98 99  CO2 20* 22 22 21* 22 25 23   GLUCOSE 227* 309* 345* 230* 287* 247* 281*  BUN 71* 85* 81* 89* 101* 102* 101*  CREATININE 2.46* 2.74* 2.56* 2.52* 2.38* 2.34* 2.07*  ALBUMIN  --  2.7* 2.9* 2.8*  --   --   --   CALCIUM 8.8* 9.0 9.4 9.8 9.7 9.8 9.9  PHOS  --   --   --   --  4.6  --   --   AST  --  13* 16 13*  --   --   --   ALT  --  12 15 14   --   --   --    Liver Function Tests: Recent Labs   Lab 12/22/20 1149 12/23/20 0510 12/24/20 0209  AST 13* 16 13*  ALT 12 15 14   ALKPHOS 63 67 59  BILITOT 0.7 0.8 0.9  PROT 6.8 7.0 6.5  ALBUMIN 2.7* 2.9* 2.8*   No results for input(s): LIPASE, AMYLASE in the last 168 hours. No results for input(s): AMMONIA in the last 168 hours. CBC: Recent Labs  Lab 12/23/20 0510 12/24/20 0209 12/25/20 0231 12/26/20 0427 12/27/20 0426  WBC 4.8 4.7 3.8* 4.0 5.4  HGB 8.6* 8.6* 8.4* 8.8* 9.0*  HCT 28.2* 28.0* 27.8* 29.3* 29.1*  MCV 101.1* 102.2* 101.8* 104.3* 102.1*  PLT 152 155 148* 148* 121*   Cardiac Enzymes: No results for input(s): CKTOTAL, CKMB, CKMBINDEX, TROPONINI in the last 168 hours. CBG: Recent Labs  Lab 12/26/20 0745 12/26/20 1139 12/26/20 1605 12/26/20 2101 12/27/20 0752  GLUCAP 211* 280* 233* 211* 252*    Iron Studies: No results for input(s): IRON, TIBC, TRANSFERRIN, FERRITIN in the last 72 hours. Studies/Results: ECHO TEE  Result Date: 12/25/2020    TRANSESOPHOGEAL ECHO REPORT   Patient  Name:   April [name] Date of Exam: 12/25/2020 Medical Rec #:  [id number]         Height:       61.0 in Accession #:    [id number]        Weight:       173.1 lb Date of Birth:  Apr 08, 1956         BSA:          1.776 m Patient Age:    78 years          BP:           127/89 mmHg Patient Gender: F                 HR:           116 bpm. Exam Location:  Inpatient Procedure: 3D Echo, Transesophageal Echo, Cardiac Doppler and Color Doppler Indications:     I48.0 Paroxysmal atrial fibrillation  History:         Patient has prior history of Echocardiogram examinations, most                  recent 12/17/2020. CHF, COPD; Risk Factors:Hypertension,                  Dyslipidemia and GERD.  Sonographer:     Tiffany Dance Referring Phys:  [id number] Abigail Butts Diagnosing Phys: Eleonore Chiquito MD PROCEDURE: After discussion of the risks and benefits of a TEE, an informed consent was obtained from the patient. TEE procedure time was 18 minutes. The  transesophogeal probe was passed without difficulty through the esophogus of the patient. Imaged were obtained with the patient in a left lateral decubitus position. Local oropharyngeal anesthetic was provided with Cetacaine. Sedation performed by different physician. The patient was monitored while under deep sedation. Anesthestetic sedation was provided intravenously by Anesthesiology: 173mg  of Propofol. Image quality was excellent. The patient's vital signs; including heart rate, blood pressure, and oxygen saturation; remained stable throughout the procedure. The patient developed no complications during the procedure. A successful direct current cardioversion was performed at 120 joules with 1 attempt. IMPRESSIONS  1. Atrial flutter. No LA/LAA thrombus. Successful DCCV x 1 with 120 J and return to NSR.  2. Left ventricular ejection fraction, by estimation, is 55 to 60%. The left ventricle has normal function.  3. Right ventricular systolic function is normal. The right ventricular size is normal.  4. No left atrial/left atrial appendage thrombus was detected. The LAA emptying velocity was 75 cm/s.  5. The mitral valve is normal in structure. Mild mitral valve regurgitation. No evidence of mitral stenosis.  6. Tricuspid valve regurgitation is mild to moderate.  7. The aortic valve is tricuspid. Aortic valve regurgitation is not visualized. No aortic stenosis is present.  8. There is mild (Grade II) layered plaque involving the descending aorta. FINDINGS  Left Ventricle: Left ventricular ejection fraction, by estimation, is 55 to 60%. The left ventricle has normal function. The left ventricular internal cavity size was normal in size. Right Ventricle: The right ventricular size is normal. No increase in right ventricular wall thickness. Right ventricular systolic function is normal. Left Atrium: Left atrial size was normal in size. No left atrial/left atrial appendage thrombus was detected. The LAA emptying  velocity was 75 cm/s. Right Atrium: Right atrial size was normal in size. Pericardium: There is no evidence of pericardial effusion. Presence of pericardial fat pad. Mitral Valve: The mitral valve is normal in structure.  Mild mitral valve regurgitation. No evidence of mitral valve stenosis. Tricuspid Valve: The tricuspid valve is normal in structure. Tricuspid valve regurgitation is mild to moderate. No evidence of tricuspid stenosis. Aortic Valve: The aortic valve is tricuspid. Aortic valve regurgitation is not visualized. No aortic stenosis is present. Pulmonic Valve: The pulmonic valve was grossly normal. Pulmonic valve regurgitation is not visualized. No evidence of pulmonic stenosis. Aorta: The aortic root and ascending aorta are structurally normal, with no evidence of dilitation. There is mild (Grade II) layered plaque involving the descending aorta. Venous: The left upper pulmonary vein, right upper pulmonary vein, left lower pulmonary vein and right lower pulmonary vein are normal. IAS/Shunts: The atrial septum is grossly normal.   AORTA Ao Root diam: 3.68 cm Ao Asc diam:  3.47 cm TRICUSPID VALVE TR Peak grad:   35.0 mmHg TR Vmax:        296.00 cm/s Eleonore Chiquito MD Electronically signed by Eleonore Chiquito MD Signature Date/Time: 12/25/2020/3:31:25 PM    Final    . allopurinol  100 mg Oral Daily  . amLODipine  5 mg Oral Daily  . amoxicillin-clavulanate  1 tablet Oral BID  . apixaban  5 mg Oral BID  . arformoterol  15 mcg Nebulization BID   And  . umeclidinium bromide  1 puff Inhalation Daily   And  . budesonide (PULMICORT) nebulizer solution  0.5 mg Nebulization BID  . brimonidine  1 drop Both Eyes BID  . Chlorhexidine Gluconate Cloth  6 each Topical Daily  . cycloSPORINE  1 drop Both Eyes BID  . dorzolamide  1 drop Both Eyes Daily  . ferrous sulfate  325 mg Oral Q breakfast  . FLUoxetine  40 mg Oral Daily  . furosemide  40 mg Intravenous Once  . gabapentin  200 mg Oral BID  . guaiFENesin   600 mg Oral BID  . hydrALAZINE  100 mg Oral Q8H  . hydrALAZINE  50 mg Oral Once  . insulin aspart  0-5 Units Subcutaneous QHS  . insulin aspart  0-9 Units Subcutaneous TID WC  . insulin aspart  8 Units Subcutaneous TID WC  . insulin glargine  13 Units Subcutaneous BID  . isosorbide mononitrate  30 mg Oral Daily  . latanoprost  1 drop Both Eyes QHS  . mouth rinse  15 mL Mouth Rinse BID  . methylPREDNISolone (SOLU-MEDROL) injection  40 mg Intravenous Q24H  . metoprolol tartrate  100 mg Oral BID  . mycophenolate  180 mg Oral BID  . pantoprazole  40 mg Oral BID  . saccharomyces boulardii  250 mg Oral BID  . tacrolimus  1 mg Oral BID  . valACYclovir  500 mg Oral BID  . cyanocobalamin  1,000 mcg Oral Daily    BMET    Component Value Date/Time   NA 133 (L) 12/27/2020 0636   K 5.3 (H) 12/27/2020 0636   CL 99 12/27/2020 0636   CO2 23 12/27/2020 0636   GLUCOSE 281 (H) 12/27/2020 0636   BUN 101 (H) 12/27/2020 0636   CREATININE 2.07 (H) 12/27/2020 0636   CREATININE 1.28 (H) 11/04/2020 0952   CALCIUM 9.9 12/27/2020 0636   CALCIUM 10.8 (H) 10/13/2011 1405   GFRNONAA 26 (L) 12/27/2020 0636   GFRNONAA 47 (L) 11/04/2020 0952   GFRAA 51 (L) 08/05/2020 1523   CBC    Component Value Date/Time   WBC 5.4 12/27/2020 0426   RBC 2.85 (L) 12/27/2020 0426   HGB 9.0 (L) 12/27/2020 9449  HGB 9.0 (L) 11/04/2020 0952   HGB 10.6 (L) 12/29/2010 1522   HCT 29.1 (L) 12/27/2020 0426   HCT 31.2 (L) 12/29/2010 1522   PLT 121 (L) 12/27/2020 0426   PLT 241 11/04/2020 0952   PLT 205 12/29/2010 1522   MCV 102.1 (H) 12/27/2020 0426   MCV 94 12/29/2010 1522   MCH 31.6 12/27/2020 0426   MCHC 30.9 12/27/2020 0426   RDW 17.5 (H) 12/27/2020 0426   RDW 12.8 12/29/2010 1522   LYMPHSABS 1.7 12/17/2020 0425   LYMPHSABS 1.1 12/29/2010 1522   MONOABS 1.0 12/17/2020 0425   EOSABS 0.1 12/17/2020 0425   EOSABS 0.2 12/29/2010 1522   BASOSABS 0.0 12/17/2020 0425   BASOSABS 0.0 12/29/2010 1522      Assessment/Plan:  1. AKI/CKD stage 3a- multifactorial, in setting of intravascular volume depletion from poor po intake as well as N/V as well as hypotension from sepsis/hypoperfusion and 3rd spacing from acute on chronic diastolic CHF. Her Scr was improving until IV lasix was started on 12/21/20. Scr peaked at 2.71 but has slowly been improving. Difficult situation as she does have peripheral edema but remains hypotensive and likely intravascularly depleted.  1. Continue supportive care - creatinine downtrending and elevated BUN may be in part with high dose steroids 2. Lasix once today 3. Adrenal insufficiency.  Low cortisol AM. Noted renin/aldo in process  2. Atrial flutter with RVR- started on heparin gtt and s/p successful DCCV 12/25/20.  Cardiology following and pt now on metoprolol 3. Hypotension-  resolved  4. Acute colitis- improving with abx. appreciate surgery  5. Acute on chronic diastolic CHF - lasix 40 mg IV once today  and goal of transitioning to lasix 80 mg daily - her home dose  6. S/p kidney transplant- Continue prograf/myfortic. Check prograf level.  Note on solumedrol taper - will transition to prednisone 7.5 mg daily for tomorrow - her home dose is 7.5 mg daily and should not be tapered lower than that  7. Hyponatremia- due to CHF/edema. improved 8. COPD- per PCCM 9. Deconditioning- very weak and will likely require PT/OT evaluation when stable.  Claudia Desanctis, MD 12/27/2020 11:53 AM

## 2020-12-27 NOTE — Plan of Care (Signed)
  Problem: Education: Goal: Knowledge of General Education information will improve Description: Including pain rating scale, medication(s)/side effects and non-pharmacologic comfort measures Outcome: Progressing   Problem: Clinical Measurements: Goal: Will remain free from infection Outcome: Progressing Goal: Respiratory complications will improve Outcome: Progressing   Problem: Activity: Goal: Risk for activity intolerance will decrease Outcome: Progressing   

## 2020-12-27 NOTE — Progress Notes (Signed)
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 Royce Macadamia, diastolic CHF,MGUS,HTN, debilityandrecent ED visit for loose uncomplicated sigmoid diverticulitis on 12/14/2020 returning with LLQ pain, vomiting, shortness of breath and fever. She was a started on Cipro and Flagyl 2 days prior but was unable to take due to nausea and vomiting.  In ED, slightly tachycardic and tachypneic. Significant labs include WBC 20.8, bicarb 19, AG17,Cr1.33 (1.16twodays prior),BUN 39andtotal bili 3.1.proBNP 2871-02-12. Lactic acid negative. CXR with vascular congestion and cardiomegaly. COVID-19 PCR negative. General surgery consulted. CT abdomen and pelvis,LE [place] and TTE ordered.  CT abdomen and pelvis showed mild uncomplicated sigmoid diverticulitis, polycystic kidney or liver disease as before and unremarkable appearance of right pelvic transplanted kidney. LE USconsistent with age-indeterminate nonocclusive DVT in the right popliteal vein not noted on previous ultrasound in 04/2020. TTE with LVEF of 70 to 75%, G2 DD, normal RVSP.VQ scan low probability for pulmonary embolism. She had hospital course complicated by A. fib/A. fib with RVR for which she was started on Cardizem drip and cardiology was consulted. Plan for TEE cardioversion on 12/25/20.  12/27/20: Seen and examined at bedside.  Vomited this morning, feels constipated.  Bowel regimen added.  IV antiemetics in place.  Assessment/Plan: Active Problems:   GERD without esophagitis   Renal transplant recipient   Depression   Sepsis ([place])   SOB (shortness of breath)   Acute diverticulitis   Sigmoid diverticulitis   Acute CHF (congestive heart failure) (HCC)   Bilious vomiting with  nausea  Acute on chronic diastolic heart failure-   She takes Lasix 80 mg daily at home Her Lasix has been on hold till 12/20/2020 and then was started on Lasix 40 mg daily on 12/21/2020.  Recent echo with ejection fraction 70 to 75% and grade 2 diastolic dysfunction. Net I&O +1.4 L Continue strict I's and O's and daily weight  Improving AKI on CKD stage III, improving Multifactorial, likely contributed by hypotension Baseline creatinine distally 1.6 with GFR of 34. Creatinine is downtrending, 2.0 from 2.34. Continue to avoid nephrotoxins and hypotension. Continue to monitor urine output. History of ESRD due to polycystic kidney disease status post deceased donor transplant in Feb 13, 2012.[name] Continue home CellCept and Prograf.  Severe sepsis, resolved, secondary to sigmoid diverticulitis present on admission.   She met sepsis criteria on admission with tachypnea tachycardia hypotension leukocytosis and endorgan damage such as AKI.  She was initially on Zosyn changed to Unasyn in the setting of AKI.  General surgery followed. Now on regular diet.  General surgery signed off on 12/23/2020 She is currently on Augmentin. Currently not septic appearing, no significant abdominal pain.  Constipation/vomiting Bowel regimen started.  Senokot 2 tablets twice daily, daily MiraLAX, Dulcolax suppository as needed. IV antiemetics as needed.  Anemia of chronic disease  Hemoglobin stable and uptrending, 9.0 from 8.8. No overt bleeding. Continue to monitor.  Right popliteal DVT age indeterminate nonocclusive.   VQ scan low probability for PE.  DC heparin drip 1/26 She is currently on Eliquis, continue.  Adrenal insufficiency Low cortisol a.m. Last dose of IV Solu-Medrol 12/27/2020. Back on home dose prednisone 7.5 mg daily on 12/28/2020 Hypotension has resolved  Resolved acute on chronic hypoxic respiratory failure secondary to multifactorial etiology including COPD/diastolic heart failure  She is  back  to her baseline oxygen requirement, 2 L with saturation 100%.  Essential hypertension BP is not at goal Increase Norvasc to 10 mg daily. Resume home regimen.  Chronic thrombocytopenia Stable, continue to monitor  GERD  Stable, continue Protonix twice daily.  Improving hypervolemic hyponatremia.   Serum sodium 129> 133. IV Lasix on hold.  Steroid-induced hyperglycemia  Continue insulin coverage Continue Lantus and SSI.    Resolved post TEE/DC cardioversion:  Prior to cardioversion A flutter-heart rate in 130s to 140s.  Previously on Cardizem drip Completed TEE/DC cardioversion on 12/25/2020. Continue Eliquis for CVA prevention.  Refractory hyperkalemia Serum potassium 5.3. Received IV Lasix on 12/27/2020. Repeat renal panel in the morning Pressure Injury 10/23/20 Sacrum Right;Left Stage 2 -  Partial thickness loss of dermis presenting as a shallow open injury with a red, pink wound bed without slough. (Active)  10/23/20 2058  Location: Sacrum  Location Orientation: Right;Left  Staging: Stage 2 -  Partial thickness loss of dermis presenting as a shallow open injury with a red, pink wound bed without slough.  Wound Description (Comments):   Present on Admission:     Estimated body mass index is 32.7 kg/m as calculated from the following:   Height as of this encounter: 5' 1"  (1.549 m).   Weight as of this encounter: 78.5 kg.  Moderate protein calorie malnutrition Albumin 2.8 Moderate muscle mass loss Continue to encourage increase oral protein calorie intake.  DVT prophylaxis: Eliquis Code Status: Full code   Status is: Inpatient  Dispo: The patient is from:Home              Anticipated d/c is GD:JMEQ               Anticipated d/c date is: 12/30/20               Patient currently Not stable to dc due to ongoing management of AKI.                Difficult to place patient N/A        Objective: Vitals:   12/27/20 0909 12/27/20 1256 12/27/20 1403  12/27/20 1530  BP:  (!) 177/67 (!) 188/71 (!) 184/72  Pulse:  72 73 72  Resp:   20   Temp:   97.8 F (36.6 C)   TempSrc:   Oral   SpO2: 93%  99% 100%  Weight:      Height:        Intake/Output Summary (Last 24 hours) at 12/27/2020 1558 Last data filed at 12/27/2020 1137 Gross per 24 hour  Intake 1570 ml  Output 1175 ml  Net 395 ml   Filed Weights   12/25/20 2121 12/26/20 0500 12/27/20 0515  Weight: 79.1 kg 78.6 kg 78.4 kg    Exam:  . General: 65 y.o. year-old female chronically ill-appearing no distress.  Alert and oriented x3.  Cardiovascular: Regular rate and rhythm no rubs or gallops. [name] Respiratory: Clear to auscultation no wheeze or rales. . Abdomen: Soft obese mild tenderness left lower quadrant abdominal.  Bowel sounds present.   . Musculoskeletal: 1+ pitting edema in upper and lower extremities bilaterally. . Skin: Bruising on upper extremities. [name] Psychiatry: Mood is appropriate for condition and setting.   Data Reviewed: CBC: Recent Labs  Lab 12/23/20 0510 12/24/20 0209 12/25/20 0231 12/26/20 0427 12/27/20 0426  WBC 4.8 4.7 3.8* 4.0 5.4  HGB 8.6* 8.6* 8.4* 8.8* 9.0*  HCT 28.2* 28.0* 27.8* 29.3* 29.1*  MCV 101.1* 102.2* 101.8* 104.3*  102.1*  PLT 152 155 148* 148* 982*   Basic Metabolic Panel: Recent Labs  Lab 12/23/20 0510 12/24/20 0209 12/25/20 0231 12/26/20 0427 12/27/20 0636  NA 128* 131* 128* 129* 133*  K 5.0 4.7 4.8 5.1 5.3*  CL 95* 98 96* 98 99  CO2 22 21* 22 25 23   GLUCOSE 345* 230* 287* 247* 281*  BUN 81* 89* 101* 102* 101*  CREATININE 2.56* 2.52* 2.38* 2.34* 2.07*  CALCIUM 9.4 9.8 9.7 9.8 9.9  MG  --   --  2.5*  --  2.5*  PHOS  --   --  4.6  --   --    GFR: Estimated Creatinine Clearance: 26.3 mL/min (A) (by C-G formula based on SCr of 2.07 mg/dL (H)). Liver Function Tests: Recent Labs  Lab 12/22/20 1149 12/23/20 0510 12/24/20 0209  AST 13* 16 13*  ALT 12 15 14   ALKPHOS 63 67 59  BILITOT 0.7 0.8 0.9  PROT 6.8 7.0 6.5   ALBUMIN 2.7* 2.9* 2.8*   No results for input(s): LIPASE, AMYLASE in the last 168 hours. No results for input(s): AMMONIA in the last 168 hours. Coagulation Profile: Recent Labs  Lab 12/25/20 0810  INR 1.5*   Cardiac Enzymes: No results for input(s): CKTOTAL, CKMB, CKMBINDEX, TROPONINI in the last 168 hours. BNP (last 3 results) No results for input(s): PROBNP in the last 8760 hours. HbA1C: No results for input(s): HGBA1C in the last 72 hours. CBG: Recent Labs  Lab 12/26/20 1139 12/26/20 1605 12/26/20 2101 12/27/20 0752 12/27/20 1133  GLUCAP 280* 233* 211* 252* 196*   Lipid Profile: No results for input(s): CHOL, HDL, LDLCALC, TRIG, CHOLHDL, LDLDIRECT in the last 72 hours. Thyroid Function Tests: Recent Labs    12/26/20 0944  TSH 0.385   Anemia Panel: No results for input(s): VITAMINB12, FOLATE, FERRITIN, TIBC, IRON, RETICCTPCT in the last 72 hours. Urine analysis:    Component Value Date/Time   COLORURINE YELLOW 12/24/2020 1700   APPEARANCEUR CLEAR 12/24/2020 1700   LABSPEC 1.015 12/24/2020 1700   PHURINE 5.0 12/24/2020 1700   GLUCOSEU 50 (A) 12/24/2020 1700   HGBUR NEGATIVE 12/24/2020 1700   BILIRUBINUR NEGATIVE 12/24/2020 1700   KETONESUR NEGATIVE 12/24/2020 1700   PROTEINUR NEGATIVE 12/24/2020 1700   UROBILINOGEN 0.2 05/15/2013 0733   NITRITE NEGATIVE 12/24/2020 1700   LEUKOCYTESUR NEGATIVE 12/24/2020 1700   Sepsis Labs: @LABRCNTIP (procalcitonin:4,lacticidven:4)  ) Recent Results (from the past 240 hour(s))  MRSA PCR Screening     Status: None   Collection Time: 12/23/20  5:45 PM   Specimen: Nasal Mucosa; Nasopharyngeal  Result Value Ref Range Status   MRSA by PCR NEGATIVE NEGATIVE Final    Comment:        The GeneXpert MRSA Assay (FDA approved for NASAL specimens only), is one component of a comprehensive MRSA colonization surveillance program. It is not intended to diagnose MRSA infection nor to guide or monitor treatment for MRSA  infections. Performed at [facility], [place] [address] [place], [place] [address]   SARS Coronavirus 2 by RT PCR (hospital order, performed in [facility] hospital lab) Nasopharyngeal Nasopharyngeal Swab     Status: None   Collection Time: 12/25/20  8:30 AM   Specimen: Nasopharyngeal Swab  Result Value Ref Range Status   SARS Coronavirus 2 NEGATIVE NEGATIVE Final    Comment: (NOTE) SARS-CoV-2 target nucleic acids are NOT DETECTED.  The SARS-CoV-2 RNA is generally detectable in upper and lower respiratory specimens during the acute phase of  infection. The lowest concentration of SARS-CoV-2 viral copies this assay can detect is 250 copies / mL. A negative result does not preclude SARS-CoV-2 infection and should not be used as the sole basis for treatment or other patient management decisions.  A negative result may occur with improper specimen collection / handling, submission of specimen other than nasopharyngeal swab, presence of viral mutation(s) within the areas targeted by this assay, and inadequate number of viral copies (<250 copies / mL). A negative result must be combined with clinical observations, patient history, and epidemiological information.  Fact Sheet for Patients:   StrictlyIdeas.no  Fact Sheet for Healthcare Providers: BankingDealers.co.za  This test is not yet approved or  cleared by the [place] FDA and has been authorized for detection and/or diagnosis of SARS-CoV-2 by FDA under an Emergency Use Authorization (EUA).  This EUA will remain in effect (meaning this test can be used) for the duration of the COVID-19 declaration under Section 564(b)(1) of the Act, 21 U.S.C. section 360bbb-3(b)(1), unless the authorization is terminated or revoked sooner.  Performed at [facility], [place] [address] [place], [place] [address]       Studies: No results found.  Scheduled  Meds: . allopurinol  100 mg Oral Daily  . amLODipine  5 mg Oral Daily  . amoxicillin-clavulanate  1 tablet Oral BID  . apixaban  5 mg Oral BID  . arformoterol  15 mcg Nebulization BID   And  . umeclidinium bromide  1 puff Inhalation Daily   And  . budesonide (PULMICORT) nebulizer solution  0.5 mg Nebulization BID  . brimonidine  1 drop Both Eyes BID  . Chlorhexidine Gluconate Cloth  6 each Topical Daily  . cycloSPORINE  1 drop Both Eyes BID  . dorzolamide  1 drop Both Eyes Daily  . ferrous sulfate  325 mg Oral Q breakfast  . FLUoxetine  40 mg Oral Daily  . gabapentin  200 mg Oral BID  . guaiFENesin  600 mg Oral BID  . hydrALAZINE  100 mg Oral Q8H  . insulin aspart  0-5 Units Subcutaneous QHS  . insulin aspart  0-9 Units Subcutaneous TID WC  . insulin aspart  8 Units Subcutaneous TID WC  . insulin glargine  13 Units Subcutaneous BID  . isosorbide mononitrate  30 mg Oral Daily  . latanoprost  1 drop Both Eyes QHS  . mouth rinse  15 mL Mouth Rinse BID  . methylPREDNISolone (SOLU-MEDROL) injection  40 mg Intravenous Q24H  . metoprolol tartrate  100 mg Oral BID  . mycophenolate  180 mg Oral BID  . pantoprazole  40 mg Oral BID  . polyethylene glycol  17 g Oral Daily  . [START ON 12/28/2020] predniSONE  7.5 mg Oral Q breakfast  . saccharomyces boulardii  250 mg Oral BID  . senna-docusate  2 tablet Oral BID  . tacrolimus  1 mg Oral BID  . valACYclovir  500 mg Oral BID  . cyanocobalamin  1,000 mcg Oral Daily    Continuous Infusions:    LOS: 11 days     Kayleen Memos, MD [facility] Pager [phone]  If 7PM-7AM, please contact night-coverage [email or web address] Password [facility] 12/27/2020, 3:58 PM

## 2020-12-28 DIAGNOSIS — K5732 Diverticulitis of large intestine without perforation or abscess without bleeding: Secondary | ICD-10-CM | POA: Diagnosis not present

## 2020-12-28 DIAGNOSIS — I5021 Acute systolic (congestive) heart failure: Secondary | ICD-10-CM | POA: Diagnosis not present

## 2020-12-28 LAB — GLUCOSE, CAPILLARY
Glucose-Capillary: 273 mg/dL — ABNORMAL HIGH (ref 70–99)
Glucose-Capillary: 310 mg/dL — ABNORMAL HIGH (ref 70–99)
Glucose-Capillary: 258 mg/dL — ABNORMAL HIGH (ref 70–99)

## 2020-12-28 LAB — MAGNESIUM: Magnesium: 2.4 mg/dL (ref 1.7–2.4)

## 2020-12-28 LAB — CBC
HCT: 32.7 % — ABNORMAL LOW (ref 36.0–46.0)
Hemoglobin: 10.2 g/dL — ABNORMAL LOW (ref 12.0–15.0)
MCH: 31.8 pg (ref 26.0–34.0)
MCHC: 31.2 g/dL (ref 30.0–36.0)
MCV: 101.9 fL — ABNORMAL HIGH (ref 80.0–100.0)
Platelets: 224 10*3/uL (ref 150–400)
RBC: 3.21 MIL/uL — ABNORMAL LOW (ref 3.87–5.11)
RDW: 18.6 % — ABNORMAL HIGH (ref 11.5–15.5)
WBC: 16 10*3/uL — ABNORMAL HIGH (ref 4.0–10.5)
nRBC: 0.3 % — ABNORMAL HIGH (ref 0.0–0.2)

## 2020-12-28 LAB — BASIC METABOLIC PANEL
Anion gap: 9 (ref 5–15)
BUN: 105 mg/dL — ABNORMAL HIGH (ref 8–23)
CO2: 23 mmol/L (ref 22–32)
Calcium: 9.9 mg/dL (ref 8.9–10.3)
Chloride: 100 mmol/L (ref 98–111)
Creatinine, Ser: 1.53 mg/dL — ABNORMAL HIGH (ref 0.44–1.00)
GFR, Estimated: 38 mL/min — ABNORMAL LOW (ref >60)
Glucose, Bld: 288 mg/dL — ABNORMAL HIGH (ref 70–99)
Potassium: 5.3 mmol/L — ABNORMAL HIGH (ref 3.5–5.1)
Sodium: 132 mmol/L — ABNORMAL LOW (ref 135–145)

## 2020-12-28 MED ORDER — AMLODIPINE BESYLATE 10 MG PO TABS
10.0000 mg | ORAL_TABLET | Freq: Every day | ORAL | Status: DC
  Filled 2020-12-28: qty 1

## 2020-12-28 MED ORDER — SODIUM ZIRCONIUM CYCLOSILICATE 10 G PO PACK
10.0000 g | PACK | Freq: Once | ORAL | Status: AC
  Administered 2020-12-28: 10 g via ORAL
  Filled 2020-12-28: qty 1

## 2020-12-28 MED ORDER — AMLODIPINE BESYLATE 5 MG PO TABS
5.0000 mg | ORAL_TABLET | Freq: Every day | ORAL | Status: DC
  Administered 2020-12-28: 5 mg via ORAL
  Filled 2020-12-28: qty 1

## 2020-12-28 MED ORDER — VERAPAMIL HCL ER 240 MG PO TBCR
240.0000 mg | EXTENDED_RELEASE_TABLET | Freq: Every day | ORAL | Status: DC
  Filled 2020-12-28: qty 1

## 2020-12-28 MED ORDER — ALBUMIN HUMAN 25 % IV SOLN
12.5000 g | Freq: Once | INTRAVENOUS | Status: AC
  Administered 2020-12-28: 12.5 g via INTRAVENOUS
  Filled 2020-12-28: qty 50

## 2020-12-28 NOTE — Plan of Care (Signed)
  Problem: Health Behavior/Discharge Planning: Goal: Ability to manage health-related needs will improve Outcome: Progressing   Problem: Clinical Measurements: Goal: Ability to maintain clinical measurements within normal limits will improve Outcome: Progressing   Problem: Clinical Measurements: Goal: Cardiovascular complication will be avoided Outcome: Progressing   Problem: Activity: Goal: Risk for activity intolerance will decrease Outcome: Progressing   Problem: Skin Integrity: Goal: Risk for impaired skin integrity will decrease Outcome: Progressing

## 2020-12-28 NOTE — Progress Notes (Signed)
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 Royce Macadamia, chronic diastolic CHF,MGUS,HTN, andrecent ED visit for uncomplicated sigmoid diverticulitis on 12/14/2020 returning with LLQ pain, vomiting, shortness of breath and fever. She was started on Cipro and Flagyl 2 days prior but was unable to take due to nausea and vomiting.  CT abdomen and pelvis showed mild uncomplicated sigmoid diverticulitis, polycystic kidney or liver disease as before and unremarkable appearance of right pelvic transplanted kidney. LE USconsistent with age-indeterminate nonocclusive DVT in the right popliteal vein not noted on previous ultrasound in 04/2020. TTE with LVEF of 70 to 75%, G2 DD, normal RVSP.VQ scan low probability for pulmonary embolism.   Hospital course complicated by A. fib/A. fib with RVR for which she was started on Cardizem drip.  Had a successful TEE/DCCV cardioversion on 12/25/2020.   12/28/20: Seen and examined at her bedside.  She denies any nausea or significant abdominal pain.  Volume overload on exam.  Seen by cardiology, discussed case with Dr. Debara Pickett, highly appreciate assistance.  Assessment/Plan: Active Problems:   GERD without esophagitis   Renal transplant recipient   Depression   Sepsis ([place])   SOB (shortness of breath)   Acute diverticulitis   Sigmoid diverticulitis   Acute CHF (congestive heart failure) (HCC)   Bilious vomiting with nausea  Acute on chronic diastolic heart failure-   Prior to admission she was on Lasix 80 mg daily at home Most recent 2D echo with ejection fraction 70 to 75% and grade 2 diastolic dysfunction. Net I&O +312 cc. Continue strict I's and O's and daily weight  Refractory hyperkalemia. Serum  potassium 5.3. Received IV Lasix on 12/27/2020. 1 dose of Lokelma for potassium of 5.3.  Improving AKI on CKD stage III, improving Multifactorial, likely contributed by hypotension Baseline creatinine 1.6 with GFR of 34. Creatinine is improving 1.3 from 2.0 from 2.34. Continue to avoid nephrotoxins and hypotension. Continue to monitor urine output. History of ESRD due to polycystic kidney disease status post deceased donor transplant in 02-11-2012.[name] Continue home CellCept and Prograf.  Severe sepsis, resolved, secondary to sigmoid diverticulitis present on admission.   She met sepsis criteria on admission with tachypnea tachycardia hypotension leukocytosis and endorgan damage such as AKI.  She was initially on Zosyn changed to Unasyn in the setting of AKI.  General surgery followed. Now on regular diet.  General surgery signed off on 12/23/2020 She is currently on Augmentin. Currently not septic appearing, no significant abdominal pain.  Leukocytosis, suspect reactive in the setting of steroid use She denies having any symptoms that indicate an active infection. Afebrile Repeat CBC with differential in the morning.  Resolved constipation/vomiting She is asymptomatic this morning Bowel regimen and IV antiemetics as needed.  Anemia of chronic disease  Hemoglobin at baseline 10.2. No overt bleeding..  Right popliteal DVT age indeterminate nonocclusive.   VQ scan low probability for PE.  DC heparin drip 1/26 She is currently on Eliquis, continue.  Adrenal insufficiency Low cortisol a.m. Last dose of IV Solu-Medrol 12/27/2020. Back on home dose prednisone 7.5 mg daily on 12/28/2020 No longer hypotensive.  Resolved acute on chronic hypoxic respiratory failure secondary to multifactorial etiology including COPD/diastolic heart failure  She is back to her baseline oxygen requirement, 2 L with saturation 100%.  Essential hypertension  Continue regimen recommended by cardiology Increased  dose of Norvasc tomorrow, 10 mg daily.  Resolved thrombocytopenia Platelet 224K, 121K.  GERD  Stable, continue Protonix twice daily.  Improving hypervolemic hyponatremia.   Serum sodium 129> 133. IV Lasix on hold.  Steroid-induced hyperglycemia  Continue insulin coverage Continue Lantus and SSI.    Resolved A. fib with RVR, post TEE/DC cardioversion:  Prior to cardioversion A flutter-heart rate in 130s to 140s.  Previously on Cardizem drip Completed TEE/DC cardioversion on 12/25/2020. Continue Eliquis for CVA prevention.   Pressure Injury 10/23/20 Sacrum Right;Left Stage 2 -  Partial thickness loss of dermis presenting as a shallow open injury with a red, pink wound bed without slough. (Active)  10/23/20 2058  Location: Sacrum  Location Orientation: Right;Left  Staging: Stage 2 -  Partial thickness loss of dermis presenting as a shallow open injury with a red, pink wound bed without slough.  Wound Description (Comments):   Present on Admission:     Estimated body mass index is 32.7 kg/m as calculated from the following:   Height as of this encounter: 5' 1"  (1.549 m).   Weight as of this encounter: 78.5 kg.  Moderate protein calorie malnutrition Albumin 2.8 Moderate muscle mass loss Continue to encourage increase oral protein calorie intake.  DVT prophylaxis: Eliquis Code Status: Full code   Status is: Inpatient  Dispo: The patient is from:Home              Anticipated d/c is NG:EXBM               Anticipated d/c date is: 12/30/20               Patient currently Not stable to dc due to ongoing management of AKI and uncontrolled hypertension..                Difficult to place patient N/A        Objective: Vitals:   12/28/20 0500 12/28/20 0654 12/28/20 0858 12/28/20 1151  BP:  (!) 125/51 (!) 153/56 (!) 160/58  Pulse:  (!) 50 (!) 54 (!) 56  Resp:  17  18  Temp:  97.9 F (36.6 C)  98 F (36.7 C)  TempSrc:  Oral  Oral  SpO2:  100% 99% 98%  Weight:  77 kg     Height: 5' 1"  (1.549 m)       Intake/Output Summary (Last 24 hours) at 12/28/2020 1307 Last data filed at 12/28/2020 0500 Gross per 24 hour  Intake 600 ml  Output 1700 ml  Net -1100 ml   Filed Weights   12/26/20 0500 12/27/20 0515 12/28/20 0500  Weight: 78.6 kg 78.4 kg 77 kg    Exam:  . General: 65 y.o. year-old female chronically ill-appearing in no acute distress.  Alert and oriented x3.  Cardiovascular: Regular rate and rhythm no rubs or gallops.   [name] Respiratory: Clear to auscultation no wheezes or rales. . Abdomen: Soft obese nontender bowel sounds present.   . Musculoskeletal: 1+ pitting edema in upper and lower extremities bilaterally.   . Skin: Bruising noted in the upper extremities. [name] Psychiatry: Mood is appropriate for condition and setting.  Data Reviewed: CBC: Recent Labs  Lab 12/24/20 0209 12/25/20 0231 12/26/20 0427 12/27/20 0426 12/28/20 0513  WBC 4.7 3.8* 4.0 5.4 16.0*  HGB 8.6* 8.4* 8.8* 9.0* 10.2*  HCT 28.0* 27.8* 29.3* 29.1* 32.7*  MCV 102.2* 101.8* 104.3* 102.1* 101.9*  PLT 155 148* 148* 121* 224   Basic  Metabolic Panel: Recent Labs  Lab 12/24/20 0209 12/25/20 0231 12/26/20 0427 12/27/20 0636 12/28/20 0504  NA 131* 128* 129* 133* 132*  K 4.7 4.8 5.1 5.3* 5.3*  CL 98 96* 98 99 100  CO2 21* 22 25 23 23   GLUCOSE 230* 287* 247* 281* 288*  BUN 89* 101* 102* 101* 105*  CREATININE 2.52* 2.38* 2.34* 2.07* 1.53*  CALCIUM 9.8 9.7 9.8 9.9 9.9  MG  --  2.5*  --  2.5* 2.4  PHOS  --  4.6  --   --   --    GFR: Estimated Creatinine Clearance: 35.4 mL/min (A) (by C-G formula based on SCr of 1.53 mg/dL (H)). Liver Function Tests: Recent Labs  Lab 12/22/20 1149 12/23/20 0510 12/24/20 0209  AST 13* 16 13*  ALT 12 15 14   ALKPHOS 63 67 59  BILITOT 0.7 0.8 0.9  PROT 6.8 7.0 6.5  ALBUMIN 2.7* 2.9* 2.8*   No results for input(s): LIPASE, AMYLASE in the last 168 hours. No results for input(s): AMMONIA in the last 168 hours. Coagulation  Profile: Recent Labs  Lab 12/25/20 0810  INR 1.5*   Cardiac Enzymes: No results for input(s): CKTOTAL, CKMB, CKMBINDEX, TROPONINI in the last 168 hours. BNP (last 3 results) No results for input(s): PROBNP in the last 8760 hours. HbA1C: No results for input(s): HGBA1C in the last 72 hours. CBG: Recent Labs  Lab 12/27/20 1133 12/27/20 1744 12/27/20 2120 12/28/20 0752 12/28/20 1145  GLUCAP 196* 257* 253* 273* 310*   Lipid Profile: No results for input(s): CHOL, HDL, LDLCALC, TRIG, CHOLHDL, LDLDIRECT in the last 72 hours. Thyroid Function Tests: Recent Labs    12/26/20 0944  TSH 0.385   Anemia Panel: No results for input(s): VITAMINB12, FOLATE, FERRITIN, TIBC, IRON, RETICCTPCT in the last 72 hours. Urine analysis:    Component Value Date/Time   COLORURINE YELLOW 12/24/2020 1700   APPEARANCEUR CLEAR 12/24/2020 1700   LABSPEC 1.015 12/24/2020 1700   PHURINE 5.0 12/24/2020 1700   GLUCOSEU 50 (A) 12/24/2020 1700   HGBUR NEGATIVE 12/24/2020 1700   BILIRUBINUR NEGATIVE 12/24/2020 1700   KETONESUR NEGATIVE 12/24/2020 1700   PROTEINUR NEGATIVE 12/24/2020 1700   UROBILINOGEN 0.2 05/15/2013 0733   NITRITE NEGATIVE 12/24/2020 1700   LEUKOCYTESUR NEGATIVE 12/24/2020 1700   Sepsis Labs: @LABRCNTIP (procalcitonin:4,lacticidven:4)  ) Recent Results (from the past 240 hour(s))  MRSA PCR Screening     Status: None   Collection Time: 12/23/20  5:45 PM   Specimen: Nasal Mucosa; Nasopharyngeal  Result Value Ref Range Status   MRSA by PCR NEGATIVE NEGATIVE Final    Comment:        The GeneXpert MRSA Assay (FDA approved for NASAL specimens only), is one component of a comprehensive MRSA colonization surveillance program. It is not intended to diagnose MRSA infection nor to guide or monitor treatment for MRSA infections. Performed at [facility], [place] [address], [place], [place] [address]   SARS Coronavirus 2 by RT PCR (hospital order, performed in [facility] hospital lab) Nasopharyngeal Nasopharyngeal Swab     Status: None   Collection Time: 12/25/20  8:30 AM   Specimen: Nasopharyngeal Swab  Result Value Ref Range Status   SARS Coronavirus 2 NEGATIVE NEGATIVE Final    Comment: (NOTE) SARS-CoV-2 target nucleic acids are NOT DETECTED.  The SARS-CoV-2 RNA is generally detectable in upper and lower respiratory specimens during the acute phase of infection. The lowest concentration of SARS-CoV-2 viral copies this assay can detect is  250 copies / mL. A negative result does not preclude SARS-CoV-2 infection and should not be used as the sole basis for treatment or other patient management decisions.  A negative result may occur with improper specimen collection / handling, submission of specimen other than nasopharyngeal swab, presence of viral mutation(s) within the areas targeted by this assay, and inadequate number of viral copies (<250 copies / mL). A negative result must be combined with clinical observations, patient history, and epidemiological information.  Fact Sheet for Patients:   StrictlyIdeas.no  Fact Sheet for Healthcare Providers: BankingDealers.co.za  This test is not yet approved or  cleared by the [place] FDA and has been authorized for detection and/or diagnosis of SARS-CoV-2 by FDA under an Emergency Use Authorization (EUA).  This EUA will remain in effect (meaning this test can be used) for the duration of the COVID-19 declaration under Section 564(b)(1) of the Act, 21 U.S.C. section 360bbb-3(b)(1), unless the authorization is terminated or revoked sooner.  Performed at [facility], [place] [address], [place], [place] [address]       Studies: No results found.  Scheduled Meds: . allopurinol  100 mg Oral Daily  . [START ON 12/29/2020] amLODipine  10 mg Oral Daily  . amoxicillin-clavulanate  1 tablet Oral BID  . apixaban  5 mg Oral BID  .  arformoterol  15 mcg Nebulization BID   And  . umeclidinium bromide  1 puff Inhalation Daily   And  . budesonide (PULMICORT) nebulizer solution  0.5 mg Nebulization BID  . brimonidine  1 drop Both Eyes BID  . Chlorhexidine Gluconate Cloth  6 each Topical Daily  . cycloSPORINE  1 drop Both Eyes BID  . dorzolamide  1 drop Both Eyes Daily  . ferrous sulfate  325 mg Oral Q breakfast  . FLUoxetine  40 mg Oral Daily  . gabapentin  200 mg Oral BID  . guaiFENesin  600 mg Oral BID  . hydrALAZINE  100 mg Oral Q8H  . insulin aspart  0-5 Units Subcutaneous QHS  . insulin aspart  0-9 Units Subcutaneous TID WC  . insulin aspart  8 Units Subcutaneous TID WC  . insulin glargine  13 Units Subcutaneous BID  . isosorbide mononitrate  30 mg Oral Daily  . latanoprost  1 drop Both Eyes QHS  . mouth rinse  15 mL Mouth Rinse BID  . metoprolol tartrate  100 mg Oral BID  . mycophenolate  180 mg Oral BID  . pantoprazole  40 mg Oral BID  . polyethylene glycol  17 g Oral Daily  . predniSONE  7.5 mg Oral Q breakfast  . saccharomyces boulardii  250 mg Oral BID  . senna-docusate  2 tablet Oral BID  . tacrolimus  1 mg Oral BID  . valACYclovir  500 mg Oral BID  . cyanocobalamin  1,000 mcg Oral Daily    Continuous Infusions:    LOS: 12 days     Kayleen Memos, MD [facility] Pager [phone]  If 7PM-7AM, please contact night-coverage [email or web address] Password [email or web address] 12/28/2020, 1:07 PM

## 2020-12-28 NOTE — Progress Notes (Signed)
Progress Note  Patient Name: April [name] Date of Encounter: 12/28/2020  [facility] Cardiologist: Sanda Klein, MD   Subjective   No issues overnight. Net negative 580 cc recorded. Creatinine improved to 1.53 (from 2.07). Diuresis per nephrology. Maintaining sinus bradycardia after TEE/DCCV.  Inpatient Medications    Scheduled Meds: . allopurinol  100 mg Oral Daily  . amLODipine  5 mg Oral Daily  . amoxicillin-clavulanate  1 tablet Oral BID  . apixaban  5 mg Oral BID  . arformoterol  15 mcg Nebulization BID   And  . umeclidinium bromide  1 puff Inhalation Daily   And  . budesonide (PULMICORT) nebulizer solution  0.5 mg Nebulization BID  . brimonidine  1 drop Both Eyes BID  . Chlorhexidine Gluconate Cloth  6 each Topical Daily  . cycloSPORINE  1 drop Both Eyes BID  . dorzolamide  1 drop Both Eyes Daily  . ferrous sulfate  325 mg Oral Q breakfast  . FLUoxetine  40 mg Oral Daily  . gabapentin  200 mg Oral BID  . guaiFENesin  600 mg Oral BID  . hydrALAZINE  100 mg Oral Q8H  . insulin aspart  0-5 Units Subcutaneous QHS  . insulin aspart  0-9 Units Subcutaneous TID WC  . insulin aspart  8 Units Subcutaneous TID WC  . insulin glargine  13 Units Subcutaneous BID  . isosorbide mononitrate  30 mg Oral Daily  . latanoprost  1 drop Both Eyes QHS  . mouth rinse  15 mL Mouth Rinse BID  . metoprolol tartrate  100 mg Oral BID  . mycophenolate  180 mg Oral BID  . pantoprazole  40 mg Oral BID  . polyethylene glycol  17 g Oral Daily  . predniSONE  7.5 mg Oral Q breakfast  . saccharomyces boulardii  250 mg Oral BID  . senna-docusate  2 tablet Oral BID  . tacrolimus  1 mg Oral BID  . valACYclovir  500 mg Oral BID  . cyanocobalamin  1,000 mcg Oral Daily   Continuous Infusions:  PRN Meds: acetaminophen **OR** acetaminophen, ALPRAZolam, bisacodyl, ipratropium-albuterol, metoprolol tartrate, ondansetron **OR** ondansetron (ZOFRAN) IV   Vital Signs    Vitals:   12/28/20  0415 12/28/20 0500 12/28/20 0654 12/28/20 0858  BP: (!) 116/54  (!) 125/51 (!) 153/56  Pulse: (!) 50  (!) 50 (!) 54  Resp: 19  17   Temp: 97.9 F (36.6 C)  97.9 F (36.6 C)   TempSrc: Oral  Oral   SpO2: 100%  100% 99%  Weight:  77 kg    Height:  5\' 1"  (1.549 m)      Intake/Output Summary (Last 24 hours) at 12/28/2020 1028 Last data filed at 12/28/2020 0500 Gross per 24 hour  Intake 1120 ml  Output 1700 ml  Net -580 ml   Last 3 Weights 12/28/2020 12/27/2020 12/26/2020  Weight (lbs) 169 lb 12.1 oz 172 lb 13.5 oz 173 lb 4.5 oz  Weight (kg) 77 kg 78.4 kg 78.6 kg      Telemetry    Sinus bradycardia - Personally Reviewed  ECG    No new tracings - Personally Reviewed  Physical Exam   VS:  BP (!) 153/56 (BP Location: Left Leg)   Pulse (!) 54   Temp 97.9 F (36.6 C) (Oral)   Resp 17   Ht 5\' 1"  (1.549 m)   Wt 77 kg   SpO2 99%   BMI 32.07 kg/m  , BMI Body mass index  is 32.07 kg/m.  General appearance: alert and no distress Neck: no carotid bruit, no JVD and thyroid not enlarged, symmetric, no tenderness/mass/nodules Lungs: clear to auscultation bilaterally Heart: regular rate and rhythm, S1, S2 normal, no murmur, click, rub or gallop Abdomen: soft, non-tender; bowel sounds normal; no masses,  no organomegaly Extremities: extremities normal, atraumatic, no cyanosis or edema Pulses: 2+ and symmetric Skin: Skin color, texture, turgor normal. No rashes or lesions Neurologic: Grossly normal Psych: Pleasant  Labs    High Sensitivity Troponin:   Recent Labs  Lab 12/18/20 1042 12/18/20 1357 12/22/20 1643  TROPONINIHS 11 14 5       Chemistry Recent Labs  Lab 12/22/20 1149 12/23/20 0510 12/24/20 0209 12/25/20 0231 12/26/20 0427 12/27/20 0636 12/28/20 0504  NA 127* 128* 131*   < > 129* 133* 132*  K 5.1 5.0 4.7   < > 5.1 5.3* 5.3*  CL 95* 95* 98   < > 98 99 100  CO2 22 22 21*   < > 25 23 23   GLUCOSE 309* 345* 230*   < > 247* 281* 288*  BUN 85* 81* 89*   < > 102* 101*  105*  CREATININE 2.74* 2.56* 2.52*   < > 2.34* 2.07* 1.53*  CALCIUM 9.0 9.4 9.8   < > 9.8 9.9 9.9  PROT 6.8 7.0 6.5  --   --   --   --   ALBUMIN 2.7* 2.9* 2.8*  --   --   --   --   AST 13* 16 13*  --   --   --   --   ALT 12 15 14   --   --   --   --   ALKPHOS 63 67 59  --   --   --   --   BILITOT 0.7 0.8 0.9  --   --   --   --   GFRNONAA 19* 20* 21*   < > 23* 26* 38*  ANIONGAP 10 11 12    < > 6 11 9    < > = values in this interval not displayed.     Hematology Recent Labs  Lab 12/26/20 0427 12/27/20 0426 12/28/20 0513  WBC 4.0 5.4 16.0*  RBC 2.81* 2.85* 3.21*  HGB 8.8* 9.0* 10.2*  HCT 29.3* 29.1* 32.7*  MCV 104.3* 102.1* 101.9*  MCH 31.3 31.6 31.8  MCHC 30.0 30.9 31.2  RDW 17.5* 17.5* 18.6*  PLT 148* 121* 224    BNP Recent Labs  Lab 12/24/20 1303  BNP 1,624.9*     DDimer No results for input(s): DDIMER in the last 168 hours.   Radiology    No results found.  Cardiac Studies   ECHO 12/17/20  IMPRESSIONS   1. Left ventricular ejection fraction, by estimation, is 70 to 75%. The  left ventricle has hyperdynamic function. The left ventricle has no  regional wall motion abnormalities. Left ventricular diastolic parameters  are consistent with Grade II diastolic  dysfunction (pseudonormalization). Elevated left atrial pressure.  2. Right ventricular systolic function is normal. The right ventricular  size is normal. There is normal pulmonary artery systolic pressure.  3. The mitral valve is normal in structure. No evidence of mitral valve  regurgitation. No evidence of mitral stenosis.  4. The aortic valve is tricuspid. Aortic valve regurgitation is not  visualized. No aortic stenosis is present.  5. The inferior vena cava is normal in size with greater than 50%  respiratory variability, suggesting right atrial pressure  of 3 mmHg.   Patient Profile     65 y.o. female with a hx of HTN, GERD, COPD, MGUS, HFpEF,SVT 01/2020,ESRD with renal transplant 2013 on  immunosuppressants followed at [facility] and admitted 1.24.22 with diverticulitis after failing outpt therapy.  Initially followed by cardiology for hypotension, however now with new onset atrial flutter with RVR.   Assessment & Plan    # Atrial flutter with RVR:  New onset this admission.  Underwent successful TEE/DCCV on 12/25/2020.  She is maintaining sinus rhythm.  Continue Eliquis and metoprolol.  # Hypotension:  # Hypertension: Hypotension resolved.  Patient was initially hypotensive in the setting of sepsis and atrial flutter with RVR.  Today blood pressures remains elevated despite increasing hydralazine and metoprolol yesterday.  Continue hydralazine 100 mg q8  Hours.  Increase amlodipine to 10 mg daily tomorrow.  Continue high dose metoprolol 100 mg BID.  # Acute colitis:  Initially failed outpatient antibiotics due to persistent N/V/D.  Continue management per primary team and surgery  # Acute on chronic diastolic CHF:  # AoCKD in patient with hx of renal transplant: Volume status is difficult.  Home lasix initially held due to [facility]. She was restarted on lasix 40mg  daily 12/21/20. Cr peaked at 2.74.  Volume status is difficult as she has intravascular volume depletion receiving albumin.  She does have significant lower extremity edema.  Appreciate nephrology input.  Will defer diuretic management to them.  # History of SVT: episode occurred 01/2020 in the setting of an acute asthma exacerbation.    # Raynaud's: suspect BBlocker has been avoided due to history of raynaud's and asthma.  Now on amlodipine which should help.  For questions or updates, please contact [place] Please consult [email or web address] for contact info under   Pixie Casino, MD, FACC, [place] Director of the [facility]  [facility] Diplomate of the American Board of Clinical Lipidology Attending Cardiologist  Direct Dial: [phone]   Fax: [phone]  Website:  [email or web address]  Pixie Casino, MD  12/28/2020, 10:28 AM

## 2020-12-28 NOTE — Progress Notes (Signed)
Patient ID: April [name], female   DOB: January 24, 1956, 65 y.o.   MRN: [id number] S: Feels a little better today with good diuresis overnight. O:BP (!) 160/58 (BP Location: Left Leg)   Pulse (!) 56   Temp 98 F (36.7 C) (Oral)   Resp 18   Ht 5\' 1"  (1.549 m)   Wt 77 kg   SpO2 98%   BMI 32.07 kg/m   Intake/Output Summary (Last 24 hours) at 12/28/2020 1327 Last data filed at 12/28/2020 0500 Gross per 24 hour  Intake 600 ml  Output 1700 ml  Net -1100 ml   Intake/Output: I/O last 3 completed shifts: In: 1370 [P.O.:1370] Out: 2600 [Urine:2600]  Intake/Output this shift:  No intake/output data recorded. Weight change: -1.4 kg Gen: frail, chronically ill-appearing but in NAD CVS: Bradycardic at 56, no rub Resp: cta Abd: +BS, soft, NT/ND Ext: 1+ BLE edema  Recent Labs  Lab 12/22/20 1149 12/23/20 0510 12/24/20 0209 12/25/20 0231 12/26/20 0427 12/27/20 0636 12/28/20 0504  NA 127* 128* 131* 128* 129* 133* 132*  K 5.1 5.0 4.7 4.8 5.1 5.3* 5.3*  CL 95* 95* 98 96* 98 99 100  CO2 22 22 21* 22 25 23 23   GLUCOSE 309* 345* 230* 287* 247* 281* 288*  BUN 85* 81* 89* 101* 102* 101* 105*  CREATININE 2.74* 2.56* 2.52* 2.38* 2.34* 2.07* 1.53*  ALBUMIN 2.7* 2.9* 2.8*  --   --   --   --   CALCIUM 9.0 9.4 9.8 9.7 9.8 9.9 9.9  PHOS  --   --   --  4.6  --   --   --   AST 13* 16 13*  --   --   --   --   ALT 12 15 14   --   --   --   --    Liver Function Tests: Recent Labs  Lab 12/22/20 1149 12/23/20 0510 12/24/20 0209  AST 13* 16 13*  ALT 12 15 14   ALKPHOS 63 67 59  BILITOT 0.7 0.8 0.9  PROT 6.8 7.0 6.5  ALBUMIN 2.7* 2.9* 2.8*   No results for input(s): LIPASE, AMYLASE in the last 168 hours. No results for input(s): AMMONIA in the last 168 hours. CBC: Recent Labs  Lab 12/24/20 0209 12/25/20 0231 12/26/20 0427 12/27/20 0426 12/28/20 0513  WBC 4.7 3.8* 4.0 5.4 16.0*  HGB 8.6* 8.4* 8.8* 9.0* 10.2*  HCT 28.0* 27.8* 29.3* 29.1* 32.7*  MCV 102.2* 101.8* 104.3* 102.1* 101.9*   PLT 155 148* 148* 121* 224   Cardiac Enzymes: No results for input(s): CKTOTAL, CKMB, CKMBINDEX, TROPONINI in the last 168 hours. CBG: Recent Labs  Lab 12/27/20 1133 12/27/20 1744 12/27/20 2120 12/28/20 0752 12/28/20 1145  GLUCAP 196* 257* 253* 273* 310*    Iron Studies: No results for input(s): IRON, TIBC, TRANSFERRIN, FERRITIN in the last 72 hours. Studies/Results: No results found. [name] allopurinol  100 mg Oral Daily  . [START ON 12/29/2020] amLODipine  10 mg Oral Daily  . amoxicillin-clavulanate  1 tablet Oral BID  . apixaban  5 mg Oral BID  . arformoterol  15 mcg Nebulization BID   And  . umeclidinium bromide  1 puff Inhalation Daily   And  . budesonide (PULMICORT) nebulizer solution  0.5 mg Nebulization BID  . brimonidine  1 drop Both Eyes BID  . Chlorhexidine Gluconate Cloth  6 each Topical Daily  . cycloSPORINE  1 drop Both Eyes BID  . dorzolamide  1 drop Both Eyes  Daily  . ferrous sulfate  325 mg Oral Q breakfast  . FLUoxetine  40 mg Oral Daily  . gabapentin  200 mg Oral BID  . guaiFENesin  600 mg Oral BID  . hydrALAZINE  100 mg Oral Q8H  . insulin aspart  0-5 Units Subcutaneous QHS  . insulin aspart  0-9 Units Subcutaneous TID WC  . insulin aspart  8 Units Subcutaneous TID WC  . insulin glargine  13 Units Subcutaneous BID  . isosorbide mononitrate  30 mg Oral Daily  . latanoprost  1 drop Both Eyes QHS  . mouth rinse  15 mL Mouth Rinse BID  . metoprolol tartrate  100 mg Oral BID  . mycophenolate  180 mg Oral BID  . pantoprazole  40 mg Oral BID  . polyethylene glycol  17 g Oral Daily  . predniSONE  7.5 mg Oral Q breakfast  . saccharomyces boulardii  250 mg Oral BID  . senna-docusate  2 tablet Oral BID  . sodium zirconium cyclosilicate  10 g Oral Once  . tacrolimus  1 mg Oral BID  . valACYclovir  500 mg Oral BID  . cyanocobalamin  1,000 mcg Oral Daily    BMET    Component Value Date/Time   NA 132 (L) 12/28/2020 0504   K 5.3 (H) 12/28/2020 0504   CL 100  12/28/2020 0504   CO2 23 12/28/2020 0504   GLUCOSE 288 (H) 12/28/2020 0504   BUN 105 (H) 12/28/2020 0504   CREATININE 1.53 (H) 12/28/2020 0504   CREATININE 1.28 (H) 11/04/2020 0952   CALCIUM 9.9 12/28/2020 0504   CALCIUM 10.8 (H) 10/13/2011 1405   GFRNONAA 38 (L) 12/28/2020 0504   GFRNONAA 47 (L) 11/04/2020 0952   GFRAA 51 (L) 08/05/2020 1523   CBC    Component Value Date/Time   WBC 16.0 (H) 12/28/2020 0513   RBC 3.21 (L) 12/28/2020 0513   HGB 10.2 (L) 12/28/2020 0513   HGB 9.0 (L) 11/04/2020 0952   HGB 10.6 (L) 12/29/2010 1522   HCT 32.7 (L) 12/28/2020 0513   HCT 31.2 (L) 12/29/2010 1522   PLT 224 12/28/2020 0513   PLT 241 11/04/2020 0952   PLT 205 12/29/2010 1522   MCV 101.9 (H) 12/28/2020 0513   MCV 94 12/29/2010 1522   MCH 31.8 12/28/2020 0513   MCHC 31.2 12/28/2020 0513   RDW 18.6 (H) 12/28/2020 0513   RDW 12.8 12/29/2010 1522   LYMPHSABS 1.7 12/17/2020 0425   LYMPHSABS 1.1 12/29/2010 1522   MONOABS 1.0 12/17/2020 0425   EOSABS 0.1 12/17/2020 0425   EOSABS 0.2 12/29/2010 1522   BASOSABS 0.0 12/17/2020 0425   BASOSABS 0.0 12/29/2010 1522    Assessment/Plan:  1. AKI/CKD stage 3a- multifactorial, in setting of intravascular volume depletion from poor po intake as well as N/V as well as hypotension from sepsis/hypoperfusion and 3rd spacing from acute on chronic diastolic CHF. Her Scr was improving until IV lasix was started on 12/21/20. Scr peaked at 2.71 but has slowly been improving. Difficult situation as she does have peripheral edema but remains hypotensive and likely intravascularly depleted.  1. Continue supportive care - creatinine downtrending and elevated BUN may be in part with high dose steroids 2. Lasix given 12/27/20 with good UOP.  Hold lasix today due to rising BUN. 3. Adrenal insufficiency.  Low cortisol AM. Noted renin/aldo in process  2. Atrial flutter with RVR- started on heparin gtt and s/p successful DCCV2/2/22. Cardiology following and pt now  on metoprolol 3.  Hypotension-  resolved  4. Acute colitis- improving with abx. appreciate surgery  5. Acute on chronic diastolic CHF - lasix 40 mg IV once today  and goal of transitioning to lasix 80 mg daily - her home dose  6. S/p kidney transplant- Continue prograf/myfortic. Check prograf level.  Note on solumedrol taper - will transition to prednisone 7.5 mg daily - her home dose is 7.5 mg daily and should not be tapered lower than that  7. Hyponatremia- due to CHF/edema. improved 8. COPD- per PCCM 9. Deconditioning- very weak and will likely require PT/OT evaluation when stable.  Donetta Potts, MD [employer] [phone]

## 2020-12-29 ENCOUNTER — Inpatient Hospital Stay (HOSPITAL_COMMUNITY): Payer: Medicare Other | Admitting: Anesthesiology

## 2020-12-29 ENCOUNTER — Inpatient Hospital Stay (HOSPITAL_COMMUNITY): Payer: Medicare Other

## 2020-12-29 ENCOUNTER — Encounter (HOSPITAL_COMMUNITY)
Admission: EM | Disposition: A | Discharge status: Skilled Nursing Facility | Payer: Self-pay | Source: Home / Self Care | Attending: Student

## 2020-12-29 ENCOUNTER — Encounter (HOSPITAL_COMMUNITY): Payer: Self-pay | Admitting: Internal Medicine

## 2020-12-29 DIAGNOSIS — I509 Heart failure, unspecified: Secondary | ICD-10-CM | POA: Diagnosis not present

## 2020-12-29 DIAGNOSIS — R198 Other specified symptoms and signs involving the digestive system and abdomen: Secondary | ICD-10-CM

## 2020-12-29 DIAGNOSIS — Z940 Kidney transplant status: Secondary | ICD-10-CM | POA: Diagnosis not present

## 2020-12-29 DIAGNOSIS — K5792 Diverticulitis of intestine, part unspecified, without perforation or abscess without bleeding: Secondary | ICD-10-CM | POA: Diagnosis not present

## 2020-12-29 DIAGNOSIS — A419 Sepsis, unspecified organism: Principal | ICD-10-CM

## 2020-12-29 DIAGNOSIS — K5732 Diverticulitis of large intestine without perforation or abscess without bleeding: Secondary | ICD-10-CM | POA: Diagnosis not present

## 2020-12-29 DIAGNOSIS — K659 Peritonitis, unspecified: Secondary | ICD-10-CM | POA: Diagnosis not present

## 2020-12-29 DIAGNOSIS — R6521 Severe sepsis with septic shock: Secondary | ICD-10-CM

## 2020-12-29 HISTORY — PX: LAPAROTOMY: SHX154

## 2020-12-29 LAB — CBC WITH DIFFERENTIAL/PLATELET
Abs Immature Granulocytes: 0.1 10*3/uL — ABNORMAL HIGH (ref 0.00–0.07)
Basophils Absolute: 0 10*3/uL (ref 0.0–0.1)
Basophils Relative: 0 %
Eosinophils Absolute: 0 10*3/uL (ref 0.0–0.5)
Eosinophils Relative: 0 %
HCT: 27 % — ABNORMAL LOW (ref 36.0–46.0)
Hemoglobin: 8.4 g/dL — ABNORMAL LOW (ref 12.0–15.0)
Immature Granulocytes: 1 %
Lymphocytes Relative: 2 %
Lymphs Abs: 0.3 10*3/uL — ABNORMAL LOW (ref 0.7–4.0)
MCH: 32.6 pg (ref 26.0–34.0)
MCHC: 31.1 g/dL (ref 30.0–36.0)
MCV: 104.7 fL — ABNORMAL HIGH (ref 80.0–100.0)
Monocytes Absolute: 0.3 10*3/uL (ref 0.1–1.0)
Monocytes Relative: 2 %
Neutro Abs: 14.5 10*3/uL — ABNORMAL HIGH (ref 1.7–7.7)
Neutrophils Relative %: 95 %
Platelets: 133 10*3/uL — ABNORMAL LOW (ref 150–400)
RBC: 2.58 MIL/uL — ABNORMAL LOW (ref 3.87–5.11)
RDW: 19.3 % — ABNORMAL HIGH (ref 11.5–15.5)
WBC: 15.2 10*3/uL — ABNORMAL HIGH (ref 4.0–10.5)
nRBC: 0 % (ref 0.0–0.2)

## 2020-12-29 LAB — CBC
HCT: 31.1 % — ABNORMAL LOW (ref 36.0–46.0)
Hemoglobin: 9.7 g/dL — ABNORMAL LOW (ref 12.0–15.0)
MCH: 31.8 pg (ref 26.0–34.0)
MCHC: 31.2 g/dL (ref 30.0–36.0)
MCV: 102 fL — ABNORMAL HIGH (ref 80.0–100.0)
Platelets: 177 10*3/uL (ref 150–400)
RBC: 3.05 MIL/uL — ABNORMAL LOW (ref 3.87–5.11)
RDW: 18.8 % — ABNORMAL HIGH (ref 11.5–15.5)
WBC: 15.9 10*3/uL — ABNORMAL HIGH (ref 4.0–10.5)
nRBC: 0.2 % (ref 0.0–0.2)

## 2020-12-29 LAB — BLOOD GAS, ARTERIAL
Acid-Base Excess: 0.5 mmol/L (ref 0.0–2.0)
Acid-base deficit: 1 mmol/L (ref 0.0–2.0)
Bicarbonate: 26 mmol/L (ref 20.0–28.0)
Bicarbonate: 25.2 mmol/L (ref 20.0–28.0)
FIO2: 28
FIO2: 100
O2 Saturation: 98.5 %
O2 Saturation: 99.5 %
Patient temperature: 98.6
Patient temperature: 98.6
pCO2 arterial: 49 mmHg — ABNORMAL HIGH (ref 32.0–48.0)
pCO2 arterial: 54.5 mmHg — ABNORMAL HIGH (ref 32.0–48.0)
pH, Arterial: 7.344 — ABNORMAL LOW (ref 7.350–7.450)
pH, Arterial: 7.288 — ABNORMAL LOW (ref 7.350–7.450)
pO2, Arterial: 124 mmHg — ABNORMAL HIGH (ref 83.0–108.0)
pO2, Arterial: 324 mmHg — ABNORMAL HIGH (ref 83.0–108.0)

## 2020-12-29 LAB — RENAL FUNCTION PANEL
Albumin: 2.9 g/dL — ABNORMAL LOW (ref 3.5–5.0)
Anion gap: 8 (ref 5–15)
BUN: 95 mg/dL — ABNORMAL HIGH (ref 8–23)
CO2: 27 mmol/L (ref 22–32)
Calcium: 10 mg/dL (ref 8.9–10.3)
Chloride: 101 mmol/L (ref 98–111)
Creatinine, Ser: 1.42 mg/dL — ABNORMAL HIGH (ref 0.44–1.00)
GFR, Estimated: 42 mL/min — ABNORMAL LOW (ref >60)
Glucose, Bld: 156 mg/dL — ABNORMAL HIGH (ref 70–99)
Phosphorus: 2.3 mg/dL — ABNORMAL LOW (ref 2.5–4.6)
Potassium: 4.7 mmol/L (ref 3.5–5.1)
Sodium: 136 mmol/L (ref 135–145)

## 2020-12-29 LAB — POCT I-STAT 7, (LYTES, BLD GAS, ICA,H+H)
Acid-Base Excess: 2 mmol/L (ref 0.0–2.0)
Bicarbonate: 26.9 mmol/L (ref 20.0–28.0)
Calcium, Ion: 1.38 mmol/L (ref 1.15–1.40)
HCT: 23 % — ABNORMAL LOW (ref 36.0–46.0)
Hemoglobin: 7.8 g/dL — ABNORMAL LOW (ref 12.0–15.0)
O2 Saturation: 100 %
Potassium: 4.1 mmol/L (ref 3.5–5.1)
Sodium: 136 mmol/L (ref 135–145)
TCO2: 28 mmol/L (ref 22–32)
pCO2 arterial: 41.6 mmHg (ref 32.0–48.0)
pH, Arterial: 7.419 (ref 7.350–7.450)
pO2, Arterial: 394 mmHg — ABNORMAL HIGH (ref 83.0–108.0)

## 2020-12-29 LAB — GLUCOSE, CAPILLARY
Glucose-Capillary: 153 mg/dL — ABNORMAL HIGH (ref 70–99)
Glucose-Capillary: 60 mg/dL — ABNORMAL LOW (ref 70–99)
Glucose-Capillary: 70 mg/dL (ref 70–99)
Glucose-Capillary: 116 mg/dL — ABNORMAL HIGH (ref 70–99)
Glucose-Capillary: 97 mg/dL (ref 70–99)

## 2020-12-29 LAB — PROTIME-INR
INR: 1.8 — ABNORMAL HIGH (ref 0.8–1.2)
Prothrombin Time: 20.1 seconds — ABNORMAL HIGH (ref 11.4–15.2)

## 2020-12-29 LAB — COMPREHENSIVE METABOLIC PANEL
ALT: 13 U/L (ref 0–44)
AST: 13 U/L — ABNORMAL LOW (ref 15–41)
Albumin: 3 g/dL — ABNORMAL LOW (ref 3.5–5.0)
Alkaline Phosphatase: 43 U/L (ref 38–126)
Anion gap: 7 (ref 5–15)
BUN: 83 mg/dL — ABNORMAL HIGH (ref 8–23)
CO2: 26 mmol/L (ref 22–32)
Calcium: 9.6 mg/dL (ref 8.9–10.3)
Chloride: 104 mmol/L (ref 98–111)
Creatinine, Ser: 1.19 mg/dL — ABNORMAL HIGH (ref 0.44–1.00)
GFR, Estimated: 51 mL/min — ABNORMAL LOW (ref >60)
Glucose, Bld: 79 mg/dL (ref 70–99)
Potassium: 4.4 mmol/L (ref 3.5–5.1)
Sodium: 137 mmol/L (ref 135–145)
Total Bilirubin: 1.8 mg/dL — ABNORMAL HIGH (ref 0.3–1.2)
Total Protein: 5.1 g/dL — ABNORMAL LOW (ref 6.5–8.1)

## 2020-12-29 LAB — HEPATIC FUNCTION PANEL
ALT: 27 U/L (ref 0–44)
AST: 46 U/L — ABNORMAL HIGH (ref 15–41)
Albumin: 2.9 g/dL — ABNORMAL LOW (ref 3.5–5.0)
Alkaline Phosphatase: 65 U/L (ref 38–126)
Bilirubin, Direct: 0.3 mg/dL — ABNORMAL HIGH (ref 0.0–0.2)
Indirect Bilirubin: 0.9 mg/dL (ref 0.3–0.9)
Total Bilirubin: 1.2 mg/dL (ref 0.3–1.2)
Total Protein: 6.2 g/dL — ABNORMAL LOW (ref 6.5–8.1)

## 2020-12-29 LAB — LIPASE, BLOOD: Lipase: 49 U/L (ref 11–51)

## 2020-12-29 SURGERY — LAPAROTOMY, EXPLORATORY
Anesthesia: General | Site: Abdomen

## 2020-12-29 MED ORDER — DEXTROSE 50 % IV SOLN
INTRAVENOUS | Status: AC
  Administered 2020-12-29: 25 mL
  Filled 2020-12-29: qty 50

## 2020-12-29 MED ORDER — FENTANYL 2500MCG IN NS 250ML (10MCG/ML) PREMIX INFUSION
50.0000 ug/h | INTRAVENOUS | Status: DC
  Administered 2020-12-29 – 2020-12-30 (×2): 50 ug/h via INTRAVENOUS
  Filled 2020-12-29 (×2): qty 250

## 2020-12-29 MED ORDER — LIDOCAINE HCL (PF) 2 % IJ SOLN
INTRAMUSCULAR | Status: AC
  Filled 2020-12-29: qty 5

## 2020-12-29 MED ORDER — FENTANYL CITRATE (PF) 100 MCG/2ML IJ SOLN
50.0000 ug | Freq: Once | INTRAMUSCULAR | Status: DC
Start: 2020-12-29 — End: 2020-12-30

## 2020-12-29 MED ORDER — LACTATED RINGERS IV SOLN: INTRAVENOUS | Status: DC | PRN

## 2020-12-29 MED ORDER — MIDAZOLAM HCL 2 MG/2ML IJ SOLN
INTRAMUSCULAR | Status: AC
  Filled 2020-12-29: qty 2

## 2020-12-29 MED ORDER — PROPOFOL 10 MG/ML IV BOLUS
INTRAVENOUS | Status: DC | PRN
  Administered 2020-12-29: 70 mg via INTRAVENOUS

## 2020-12-29 MED ORDER — PHENYLEPHRINE HCL (PRESSORS) 10 MG/ML IV SOLN
INTRAVENOUS | Status: AC
  Filled 2020-12-29: qty 1

## 2020-12-29 MED ORDER — FENTANYL CITRATE (PF) 100 MCG/2ML IJ SOLN
INTRAMUSCULAR | Status: AC
  Filled 2020-12-29: qty 2

## 2020-12-29 MED ORDER — ALBUMIN HUMAN 5 % IV SOLN
INTRAVENOUS | Status: AC
  Filled 2020-12-29: qty 250

## 2020-12-29 MED ORDER — ORAL CARE MOUTH RINSE
15.0000 mL | OROMUCOSAL | Status: DC
  Administered 2020-12-29 – 2020-12-31 (×19): 15 mL via OROMUCOSAL

## 2020-12-29 MED ORDER — ROCURONIUM BROMIDE 10 MG/ML (PF) SYRINGE
PREFILLED_SYRINGE | INTRAVENOUS | Status: AC
  Filled 2020-12-29: qty 10

## 2020-12-29 MED ORDER — FENTANYL CITRATE (PF) 100 MCG/2ML IJ SOLN
INTRAMUSCULAR | Status: DC | PRN
  Administered 2020-12-29 (×2): 50 ug via INTRAVENOUS

## 2020-12-29 MED ORDER — DEXAMETHASONE SODIUM PHOSPHATE 10 MG/ML IJ SOLN
INTRAMUSCULAR | Status: DC | PRN
  Administered 2020-12-29: 10 mg via INTRAVENOUS

## 2020-12-29 MED ORDER — ALBUMIN HUMAN 5 % IV SOLN
INTRAVENOUS | Status: AC
  Filled 2020-12-29: qty 500

## 2020-12-29 MED ORDER — HYDROMORPHONE HCL 1 MG/ML IJ SOLN
0.5000 mg | INTRAMUSCULAR | Status: DC | PRN
  Administered 2020-12-29: 0.5 mg via INTRAVENOUS
  Filled 2020-12-29: qty 0.5

## 2020-12-29 MED ORDER — POLYETHYLENE GLYCOL 3350 17 G PO PACK
17.0000 g | PACK | Freq: Every day | ORAL | Status: DC
  Administered 2020-12-29: 17 g
  Filled 2020-12-29: qty 1

## 2020-12-29 MED ORDER — NALOXONE HCL 0.4 MG/ML IJ SOLN
INTRAMUSCULAR | Status: AC
  Filled 2020-12-29: qty 1

## 2020-12-29 MED ORDER — VASOPRESSIN 20 UNIT/ML IV SOLN
INTRAVENOUS | Status: AC
  Filled 2020-12-29: qty 1

## 2020-12-29 MED ORDER — MIDAZOLAM HCL 2 MG/2ML IJ SOLN: 2.0000 mg | INTRAMUSCULAR | Status: DC | PRN

## 2020-12-29 MED ORDER — FENTANYL BOLUS VIA INFUSION
50.0000 ug | INTRAVENOUS | Status: DC | PRN
  Administered 2020-12-29 – 2020-12-30 (×4): 50 ug via INTRAVENOUS
  Administered 2020-12-30: 25 ug via INTRAVENOUS
  Administered 2020-12-31: 50 ug via INTRAVENOUS
  Filled 2020-12-29: qty 50

## 2020-12-29 MED ORDER — PROPOFOL 10 MG/ML IV BOLUS
INTRAVENOUS | Status: AC
  Filled 2020-12-29: qty 20

## 2020-12-29 MED ORDER — ROCURONIUM BROMIDE 100 MG/10ML IV SOLN
INTRAVENOUS | Status: DC | PRN
  Administered 2020-12-29: 20 mg via INTRAVENOUS
  Administered 2020-12-29: 50 mg via INTRAVENOUS

## 2020-12-29 MED ORDER — NALOXONE HCL 0.4 MG/ML IJ SOLN: 0.4000 mg | INTRAMUSCULAR | Status: DC | PRN

## 2020-12-29 MED ORDER — HYDROCORTISONE NA SUCCINATE PF 100 MG IJ SOLR
50.0000 mg | Freq: Four times a day (QID) | INTRAMUSCULAR | Status: AC
  Administered 2020-12-29 – 2020-12-30 (×4): 50 mg via INTRAVENOUS
  Filled 2020-12-29 (×4): qty 2

## 2020-12-29 MED ORDER — PROCHLORPERAZINE EDISYLATE 10 MG/2ML IJ SOLN
10.0000 mg | Freq: Once | INTRAMUSCULAR | Status: AC
  Administered 2020-12-29: 10 mg via INTRAVENOUS
  Filled 2020-12-29: qty 2

## 2020-12-29 MED ORDER — SUCCINYLCHOLINE CHLORIDE 200 MG/10ML IV SOSY
PREFILLED_SYRINGE | INTRAVENOUS | Status: DC | PRN
  Administered 2020-12-29: 140 mg via INTRAVENOUS

## 2020-12-29 MED ORDER — LACTATED RINGERS IV SOLN: INTRAVENOUS | Status: AC

## 2020-12-29 MED ORDER — PANTOPRAZOLE SODIUM 40 MG IV SOLR
40.0000 mg | Freq: Every day | INTRAVENOUS | Status: DC
  Administered 2020-12-29 – 2021-01-04 (×7): 40 mg via INTRAVENOUS
  Filled 2020-12-29 (×7): qty 40

## 2020-12-29 MED ORDER — PHENYLEPHRINE 40 MCG/ML (10ML) SYRINGE FOR IV PUSH (FOR BLOOD PRESSURE SUPPORT)
PREFILLED_SYRINGE | INTRAVENOUS | Status: AC
  Filled 2020-12-29: qty 10

## 2020-12-29 MED ORDER — DOCUSATE SODIUM 50 MG/5ML PO LIQD
100.0000 mg | Freq: Two times a day (BID) | ORAL | Status: DC
  Administered 2020-12-29 (×2): 100 mg
  Filled 2020-12-29 (×2): qty 10

## 2020-12-29 MED ORDER — PIPERACILLIN-TAZOBACTAM 3.375 G IVPB
3.3750 g | Freq: Three times a day (TID) | INTRAVENOUS | Status: AC
  Administered 2020-12-29 – 2021-01-03 (×17): 3.375 g via INTRAVENOUS
  Filled 2020-12-29 (×18): qty 50

## 2020-12-29 MED ORDER — SUCCINYLCHOLINE CHLORIDE 200 MG/10ML IV SOSY
PREFILLED_SYRINGE | INTRAVENOUS | Status: AC
  Filled 2020-12-29: qty 10

## 2020-12-29 MED ORDER — OXYCODONE HCL 5 MG PO TABS: 5.0000 mg | ORAL_TABLET | Freq: Four times a day (QID) | ORAL | Status: DC | PRN

## 2020-12-29 MED ORDER — 0.9 % SODIUM CHLORIDE (POUR BTL) OPTIME
TOPICAL | Status: DC | PRN
  Administered 2020-12-29: 5000 mL

## 2020-12-29 MED ORDER — ALBUMIN HUMAN 5 % IV SOLN: INTRAVENOUS | Status: DC | PRN

## 2020-12-29 MED ORDER — CHLORHEXIDINE GLUCONATE 0.12% ORAL RINSE (MEDLINE KIT)
15.0000 mL | Freq: Two times a day (BID) | OROMUCOSAL | Status: DC
  Administered 2020-12-29 – 2020-12-31 (×4): 15 mL via OROMUCOSAL

## 2020-12-29 MED ORDER — LIDOCAINE HCL (CARDIAC) PF 100 MG/5ML IV SOSY
PREFILLED_SYRINGE | INTRAVENOUS | Status: DC | PRN
  Administered 2020-12-29: 80 mg via INTRAVENOUS

## 2020-12-29 MED ORDER — MIDAZOLAM HCL 5 MG/5ML IJ SOLN
INTRAMUSCULAR | Status: DC | PRN
  Administered 2020-12-29: 2 mg via INTRAVENOUS

## 2020-12-29 MED ORDER — NOREPINEPHRINE 4 MG/250ML-% IV SOLN
0.0000 ug/min | INTRAVENOUS | Status: DC
  Administered 2020-12-29: 2 ug/min via INTRAVENOUS
  Filled 2020-12-29: qty 250

## 2020-12-29 MED ORDER — EPHEDRINE 5 MG/ML INJ
INTRAVENOUS | Status: AC
  Filled 2020-12-29: qty 10

## 2020-12-29 MED ORDER — ONDANSETRON HCL 4 MG/2ML IJ SOLN
INTRAMUSCULAR | Status: DC | PRN
  Administered 2020-12-29: 4 mg via INTRAVENOUS

## 2020-12-29 MED ORDER — PROPOFOL 1000 MG/100ML IV EMUL
0.0000 ug/kg/min | INTRAVENOUS | Status: DC
  Administered 2020-12-29: 5 ug/kg/min via INTRAVENOUS
  Filled 2020-12-29: qty 100

## 2020-12-29 MED ORDER — PHENYLEPHRINE 40 MCG/ML (10ML) SYRINGE FOR IV PUSH (FOR BLOOD PRESSURE SUPPORT)
PREFILLED_SYRINGE | INTRAVENOUS | Status: DC | PRN
  Administered 2020-12-29: 120 ug via INTRAVENOUS
  Administered 2020-12-29: 80 ug via INTRAVENOUS
  Administered 2020-12-29: 120 ug via INTRAVENOUS

## 2020-12-29 SURGICAL SUPPLY — 35 items
APL PRP STRL LF DISP 70% ISPRP (MISCELLANEOUS) ×1
BNDG GAUZE ELAST 4 BULKY (GAUZE/BANDAGES/DRESSINGS) ×2 IMPLANT
CHLORAPREP W/TINT 26 (MISCELLANEOUS) ×2 IMPLANT
DRAPE LAPAROSCOPIC ABDOMINAL (DRAPES) ×2 IMPLANT
DRSG PAD ABDOMINAL 8X10 ST (GAUZE/BANDAGES/DRESSINGS) ×4 IMPLANT
ELECT REM PT RETURN 15FT ADLT (MISCELLANEOUS) ×2 IMPLANT
GAUZE SPONGE 4X4 12PLY STRL (GAUZE/BANDAGES/DRESSINGS) ×2 IMPLANT
GLOVE BIOGEL PI IND STRL 6 (GLOVE) ×1 IMPLANT
GLOVE BIOGEL PI INDICATOR 6 (GLOVE) ×1
GLOVE INDICATOR 8.0 STRL GRN (GLOVE) ×2 IMPLANT
GLOVE SURG ENC MOIS LTX SZ7.5 (GLOVE) ×4 IMPLANT
GLOVE SURG LTX SZ6.5 (GLOVE) ×2 IMPLANT
GLOVE SURG LTX SZ7.5 (GLOVE) ×2 IMPLANT
GLOVE SURG UNDER POLY LF SZ7 (GLOVE) ×2 IMPLANT
GOWN STRL REUS W/ TWL XL LVL3 (GOWN DISPOSABLE) ×1 IMPLANT
GOWN STRL REUS W/TWL LRG LVL3 (GOWN DISPOSABLE) ×4 IMPLANT
GOWN STRL REUS W/TWL XL LVL3 (GOWN DISPOSABLE) ×6 IMPLANT
HANDLE SUCTION POOLE (INSTRUMENTS) ×1 IMPLANT
KIT BASIN OR (CUSTOM PROCEDURE TRAY) ×2 IMPLANT
KIT TURNOVER KIT A (KITS) ×2 IMPLANT
LIGASURE IMPACT 36 18CM CVD LR (INSTRUMENTS) ×2 IMPLANT
NS IRRIG 1000ML POUR BTL (IV SOLUTION) ×2 IMPLANT
PACK GENERAL/GYN (CUSTOM PROCEDURE TRAY) ×2 IMPLANT
PENCIL SMOKE EVACUATOR (MISCELLANEOUS) ×2 IMPLANT
SHEARS HARMONIC ACE PLUS 36CM (ENDOMECHANICALS) ×2 IMPLANT
STAPLER PROXIMATE 75MM BLUE (STAPLE) ×2 IMPLANT
STAPLER VISISTAT 35W (STAPLE) ×2 IMPLANT
SUCTION POOLE HANDLE (INSTRUMENTS) ×2
SUT NOVA NAB DX-16 0-1 5-0 T12 (SUTURE) ×2 IMPLANT
SUT PDS AB 1 TP1 96 (SUTURE) ×2 IMPLANT
SUT PROLENE 2 0 SH DA (SUTURE) ×2 IMPLANT
SUT VIC AB 3-0 SH 8-18 (SUTURE) ×2 IMPLANT
TAPE CLOTH SURG 6X10 WHT LF (GAUZE/BANDAGES/DRESSINGS) ×2 IMPLANT
TOWEL OR 17X26 10 PK STRL BLUE (TOWEL DISPOSABLE) ×2 IMPLANT
TRAY FOLEY MTR SLVR 16FR STAT (SET/KITS/TRAYS/PACK) ×2 IMPLANT

## 2020-12-29 NOTE — Progress Notes (Signed)
Pharmacy Antibiotic Note  April [name] is a 65 y.o. female admitted on 12/16/2020 with intra-abdominal infection.  Pharmacy has been consulted for Zosyn dosing.  Plan: Zosyn 3.375g IV q8h (4 hour infusion).  Monitor clinical progress and renal function F/U abx deescalation / LOT   Height: 5\' 1"  (154.9 cm) Weight: 77.2 kg (170 lb 3.1 oz) IBW/kg (Calculated) : 47.8  Temp (24hrs), Avg:98.3 F (36.8 C), Min:97.9 F (36.6 C), Max:98.9 F (37.2 C)  Recent Labs  Lab 12/25/20 0231 12/26/20 0427 12/27/20 0426 12/27/20 0636 12/28/20 0504 12/28/20 0513 12/29/20 0521  WBC 3.8* 4.0 5.4  --   --  16.0* 15.9*  CREATININE 2.38* 2.34*  --  2.07* 1.53*  --  1.42*    Estimated Creatinine Clearance: 38.2 mL/min (A) (by C-G formula based on SCr of 1.42 mg/dL (H)).    Allergies  Allergen Reactions  . Infed [Iron Dextran] Other (See Comments)    Chest tightness  . Pentamidine Itching, Shortness Of Breath and Swelling  . Budesonide-Formoterol Fumarate     Other reaction(s): hoarseness  . Bupropion     Other reaction(s): exacerbated depression  . Crestor [Rosuvastatin]     Other reaction(s): body aches, swelling  . Duloxetine Hcl     Other reaction(s): GI SE, weepy, worsening fatigue, foggy  . Erythromycin [Erythromycin] Other (See Comments)    Mouth Ulcers  . Iohexol Other (See Comments)    Per patient "she has had a kidney transplant and should never have contrast"  . Oxycodone Nausea Only and Nausea And Vomiting  . Pravastatin Sodium     Other reaction(s): myalgias  . Erythromycin Rash    Causes breakout in mouth  . Ultram [Tramadol Hcl] Anxiety     Thank you for allowing pharmacy to be a part of this patient's care.  Jilliana Burkes P. Legrand Como, PharmD, [place] Please utilize Amion for appropriate phone number to reach the unit pharmacist ([place]) 12/29/2020 10:00 AM

## 2020-12-29 NOTE — Progress Notes (Addendum)
Progress Note  Patient Name: April [name] Date of Encounter: 12/29/2020  [facility] Cardiologist: Sanda Klein, MD   Subjective   More abdominal discomfort today. Very obtunded - diffuse abdominal pain. CT scan today shows free air the the peritoneum - consistent with probable bowel perforation. New bilateral pleural effusions are noted. Possible developing abscess anterior to the urinary bladder. Surgery evaluating at the bedside -recommending emergency surgery.  Inpatient Medications    Scheduled Meds: . allopurinol  100 mg Oral Daily  . amLODipine  10 mg Oral Daily  . apixaban  5 mg Oral BID  . arformoterol  15 mcg Nebulization BID   And  . umeclidinium bromide  1 puff Inhalation Daily   And  . budesonide (PULMICORT) nebulizer solution  0.5 mg Nebulization BID  . brimonidine  1 drop Both Eyes BID  . Chlorhexidine Gluconate Cloth  6 each Topical Daily  . cycloSPORINE  1 drop Both Eyes BID  . dorzolamide  1 drop Both Eyes Daily  . ferrous sulfate  325 mg Oral Q breakfast  . FLUoxetine  40 mg Oral Daily  . gabapentin  200 mg Oral BID  . guaiFENesin  600 mg Oral BID  . hydrALAZINE  100 mg Oral Q8H  . insulin aspart  0-5 Units Subcutaneous QHS  . insulin aspart  0-9 Units Subcutaneous TID WC  . insulin aspart  8 Units Subcutaneous TID WC  . insulin glargine  13 Units Subcutaneous BID  . isosorbide mononitrate  30 mg Oral Daily  . latanoprost  1 drop Both Eyes QHS  . mouth rinse  15 mL Mouth Rinse BID  . metoprolol tartrate  100 mg Oral BID  . mycophenolate  180 mg Oral BID  . pantoprazole  40 mg Oral BID  . polyethylene glycol  17 g Oral Daily  . predniSONE  7.5 mg Oral Q breakfast  . saccharomyces boulardii  250 mg Oral BID  . senna-docusate  2 tablet Oral BID  . tacrolimus  1 mg Oral BID  . valACYclovir  500 mg Oral BID  . cyanocobalamin  1,000 mcg Oral Daily   Continuous Infusions: . piperacillin-tazobactam (ZOSYN)  IV     PRN Meds: acetaminophen  **OR** acetaminophen, ALPRAZolam, bisacodyl, HYDROmorphone (DILAUDID) injection, ipratropium-albuterol, metoprolol tartrate, ondansetron **OR** ondansetron (ZOFRAN) IV, oxyCODONE   Vital Signs    Vitals:   12/29/20 0500 12/29/20 0753 12/29/20 0838 12/29/20 0850  BP: 118/77 135/60  (!) 159/61  Pulse: 70 72  68  Resp:  18    Temp: 98.9 F (37.2 C) 97.9 F (36.6 C)    TempSrc: Oral Oral    SpO2: 98% 98% 95% 96%  Weight: 77.2 kg     Height:        Intake/Output Summary (Last 24 hours) at 12/29/2020 1030 Last data filed at 12/29/2020 0600 Gross per 24 hour  Intake 578.07 ml  Output 1050 ml  Net -471.93 ml   Last 3 Weights 12/29/2020 12/28/2020 12/27/2020  Weight (lbs) 170 lb 3.1 oz 169 lb 12.1 oz 172 lb 13.5 oz  Weight (kg) 77.2 kg 77 kg 78.4 kg      Telemetry    Sinus rhythm at 70 - Personally Reviewed  ECG    No new tracings - Personally Reviewed  Physical Exam   VS:  BP (!) 159/61 (BP Location: Left Leg)   Pulse 68   Temp 97.9 F (36.6 C) (Oral)   Resp 18   Ht 5\' 1"  (  1.549 m)   Wt 77.2 kg   SpO2 96%   BMI 32.16 kg/m  , BMI Body mass index is 32.16 kg/m.  General appearance: toxic Neck: no carotid bruit, no JVD and thyroid not enlarged, symmetric, no tenderness/mass/nodules Lungs: clear to auscultation bilaterally Heart: regular rate and rhythm, S1, S2 normal, no murmur, click, rub or gallop Abdomen: obese, protuberant, diffusely tender, +rebound Extremities: ecchymosis, bullae Pulses: 2+ and symmetric Skin: Skin color, texture, turgor normal. No rashes or lesions Neurologic: Mental status: obtunded, awakens to painful stimuli Psych: cannot assess  Labs    High Sensitivity Troponin:   Recent Labs  Lab 12/18/20 1042 12/18/20 1357 12/22/20 1643  TROPONINIHS 11 14 5       Chemistry Recent Labs  Lab 12/23/20 0510 12/24/20 0209 12/25/20 0231 12/27/20 0636 12/28/20 0504 12/29/20 0521 12/29/20 0822  NA 128* 131*   < > 133* 132* 136  --   K 5.0 4.7   < >  5.3* 5.3* 4.7  --   CL 95* 98   < > 99 100 101  --   CO2 22 21*   < > 23 23 27   --   GLUCOSE 345* 230*   < > 281* 288* 156*  --   BUN 81* 89*   < > 101* 105* 95*  --   CREATININE 2.56* 2.52*   < > 2.07* 1.53* 1.42*  --   CALCIUM 9.4 9.8   < > 9.9 9.9 10.0  --   PROT 7.0 6.5  --   --   --   --  6.2*  ALBUMIN 2.9* 2.8*  --   --   --  2.9* 2.9*  AST 16 13*  --   --   --   --  46*  ALT 15 14  --   --   --   --  27  ALKPHOS 67 59  --   --   --   --  65  BILITOT 0.8 0.9  --   --   --   --  1.2  GFRNONAA 20* 21*   < > 26* 38* 42*  --   ANIONGAP 11 12   < > 11 9 8   --    < > = values in this interval not displayed.     Hematology Recent Labs  Lab 12/27/20 0426 12/28/20 0513 12/29/20 0521  WBC 5.4 16.0* 15.9*  RBC 2.85* 3.21* 3.05*  HGB 9.0* 10.2* 9.7*  HCT 29.1* 32.7* 31.1*  MCV 102.1* 101.9* 102.0*  MCH 31.6 31.8 31.8  MCHC 30.9 31.2 31.2  RDW 17.5* 18.6* 18.8*  PLT 121* 224 177    BNP Recent Labs  Lab 12/24/20 1303  BNP 1,624.9*     DDimer No results for input(s): DDIMER in the last 168 hours.   Radiology    CT ABDOMEN PELVIS WO CONTRAST  Result Date: 12/29/2020 CLINICAL DATA:  Increasing abdominal pain. Reason complication of diverticulitis. Renal transplant. EXAM: CT ABDOMEN AND PELVIS WITHOUT CONTRAST TECHNIQUE: Multidetector CT imaging of the abdomen and pelvis was performed following the standard protocol without IV contrast. COMPARISON:  CT of the abdomen and pelvis without contrast 12/16/2020. FINDINGS: Lower chest: Right greater than left pleural effusions are new. There is partial collapse of the right lower lobe. Mild dependent atelectasis is present bilaterally. The heart is enlarged. Coronary artery calcifications are present. Hepatobiliary: Multiple hepatic cysts are again seen. There is some fluid under the diaphragm. No new lesions are  present. Gallbladder and common bile duct are within normal limits. Pancreas: Unremarkable. No pancreatic ductal dilatation or  surrounding inflammatory changes. Spleen: Stable splenomegaly.  No discrete lesion. Adrenals/Urinary Tract: Bilateral renal cystic disease is stable. No new lesions are present. Right lower quadrant renal transplant is unremarkable. Urinary bladder is within normal limits. Stomach/Bowel: The stomach and duodenum are within normal limits. Small bowel is unremarkable. Terminal ileum is within normal limits. The ascending and transverse colon are unremarkable. Descending colon is within normal limits. Persistent inflammatory changes are evident. New intraperitoneal air is present over the upper abdomen. A fluid collection is present anterior to the urinary bladder, adjacent to the inflamed bowel. The collection measures 4.1 x 2.9 x 2.8 cm. Vascular/Lymphatic: Atherosclerotic calcifications are present in the abdominal aorta and branch vessels without aneurysm. Reproductive: Uterus and bilateral adnexa are unremarkable. Other: Fluid extends into left inguinal hernia. The paraumbilical hernia is again noted. There is gas within the hernia. Musculoskeletal: Advanced degenerative changes are present in the lumbar spine. Degenerative changes are present in the SI joints bilaterally. Left total hip arthroplasty noted. Remote left pubic symphysis fractures noted. IMPRESSION: 1. Pneumoperitoneum compatible with bowel perforation. 2. New fluid collection anterior to the urinary bladder, adjacent to the inflamed bowel. This is concerning for a developing abscess associated with the sigmoid diverticulitis. 3. Persistent inflammatory changes the sigmoid colon compatible with diverticulitis. 4. New bilateral pleural effusions, right greater than left. 5. Cardiomegaly without failure. 6. Coronary artery disease. 7. Stable splenomegaly. 8. Aortic Atherosclerosis (ICD10-I70.0). Electronically Signed   By: [place] Morelle M.D.   On: 12/29/2020 09:53    Cardiac Studies   ECHO 12/17/20  IMPRESSIONS   1. Left ventricular  ejection fraction, by estimation, is 70 to 75%. The  left ventricle has hyperdynamic function. The left ventricle has no  regional wall motion abnormalities. Left ventricular diastolic parameters  are consistent with Grade II diastolic  dysfunction (pseudonormalization). Elevated left atrial pressure.  2. Right ventricular systolic function is normal. The right ventricular  size is normal. There is normal pulmonary artery systolic pressure.  3. The mitral valve is normal in structure. No evidence of mitral valve  regurgitation. No evidence of mitral stenosis.  4. The aortic valve is tricuspid. Aortic valve regurgitation is not  visualized. No aortic stenosis is present.  5. The inferior vena cava is normal in size with greater than 50%  respiratory variability, suggesting right atrial pressure of 3 mmHg.   Patient Profile     65 y.o. female with a hx of HTN, GERD, COPD, MGUS, HFpEF,SVT 01/2020,ESRD with renal transplant 2013 on immunosuppressants followed at [facility] and admitted 1.24.22 with diverticulitis after failing outpt therapy.  Initially followed by cardiology for hypotension, however now with new onset atrial flutter with RVR.   Assessment & Plan    # Atrial flutter with RVR:  New onset this admission.  Underwent successful TEE/DCCV on 12/25/2020.  She is maintaining sinus rhythm.  Hold Eliquis today for emergency surgery.  # Hypotension:  # Hypertension: Hypotension resolved.  Patient was initially hypotensive in the setting of sepsis and atrial flutter with RVR.  Today blood pressures remains elevated despite increasing hydralazine and metoprolol yesterday.  Continue hydralazine 100 mg q8  Hours.  Increase amlodipine to 10 mg daily tomorrow.  Continue high dose metoprolol 100 mg BID.  # Acute colitis:  Symptoms today concerning for acute abdomen -she is less responsive, lethargic - suspect septic. Respirations are labored - d/w Dr. Nevada Crane, being evaluated  by Dr. Ninfa Linden for  emergency surgery. Recommend ABG and may need to be electively intubated.  # Acute on chronic diastolic CHF:  # AoCKD in patient with hx of renal transplant: Volume status is difficult.  Home lasix initially held due to [facility]. She was restarted on lasix 40mg  daily 12/21/20. Cr peaked at 2.74.  Volume status is difficult as she has intravascular volume depletion receiving albumin.  She does have significant lower extremity edema.  Appreciate nephrology input.  Will defer diuretic management to them.  # History of SVT: episode occurred 01/2020 in the setting of an acute asthma exacerbation.    # Raynaud's: suspect BBlocker has been avoided due to history of raynaud's and asthma.  Now on amlodipine which should help.  For questions or updates, please contact [place] Please consult [email or web address] for contact info under   Pixie Casino, MD, FACC, [place] Director of the [facility] Diplomate of the American Board of Clinical Lipidology Attending Cardiologist  Direct Dial: [phone]  Fax: [phone]  Website:  [email or web address]  Pixie Casino, MD  12/29/2020, 10:30 AM

## 2020-12-29 NOTE — Anesthesia Preprocedure Evaluation (Addendum)
Anesthesia Evaluation  Patient identified by MRN, date of birth, ID band Patient awake    Reviewed: Allergy & Precautions, NPO status , Patient's Chart, lab work & pertinent test results  History of Anesthesia Complications (+) PONV and history of anesthetic complications  Airway Mallampati: II  TM Distance: >3 FB Neck ROM: Full    Dental  (+) Teeth Intact, Dental Advisory Given   Pulmonary asthma , COPD,  COPD inhaler, former smoker,    breath sounds clear to auscultation       Cardiovascular hypertension, +CHF   Rhythm:Regular Rate:Normal     Neuro/Psych PSYCHIATRIC DISORDERS Anxiety Depression    GI/Hepatic Neg liver ROS, GERD  Medicated,  Endo/Other  negative endocrine ROS  Renal/GU Renal disease     Musculoskeletal  (+) Arthritis ,   Abdominal Normal abdominal exam  (+)   Peds  Hematology   Anesthesia Other Findings   Reproductive/Obstetrics                            Anesthesia Physical Anesthesia Plan  ASA: IV and emergent  Anesthesia Plan: General   Post-op Pain Management:    Induction: Intravenous  PONV Risk Score and Plan: 4 or greater and Ondansetron and Midazolam  Airway Management Planned: Oral ETT  Additional Equipment: Arterial line, CVP and Ultrasound Guidance Line Placement  Intra-op Plan:   Post-operative Plan: Post-operative intubation/ventilation  Informed Consent: I have reviewed the patients History and Physical, chart, labs and discussed the procedure including the risks, benefits and alternatives for the proposed anesthesia with the patient or authorized representative who has indicated his/her understanding and acceptance.     Dental advisory given  Plan Discussed with: CRNA  Anesthesia Plan Comments: (Lab Results      Component                Value               Date                      WBC                      15.9 (H)            12/29/2020                 HGB                      9.7 (L)             12/29/2020                HCT                      31.1 (L)            12/29/2020                MCV                      102.0 (H)           12/29/2020                PLT                      177  12/29/2020            Lab Results      Component                Value               Date                      CREATININE               1.42 (H)            12/29/2020                BUN                      95 (H)              12/29/2020                NA                       136                 12/29/2020                K                        4.7                 12/29/2020                CL                       101                 12/29/2020                CO2                      27                  12/29/2020             Echo:  1. Atrial flutter. No LA/LAA thrombus. Successful DCCV x 1 with 120 J and  return to NSR.  2. Left ventricular ejection fraction, by estimation, is 55 to 60%. The  left ventricle has normal function.  3. Right ventricular systolic function is normal. The right ventricular  size is normal.  4. No left atrial/left atrial appendage thrombus was detected. The LAA  emptying velocity was 75 cm/s.  5. The mitral valve is normal in structure. Mild mitral valve  regurgitation. No evidence of mitral stenosis.  6. Tricuspid valve regurgitation is mild to moderate.  7. The aortic valve is tricuspid. Aortic valve regurgitation is not  visualized. No aortic stenosis is present.  8. There is mild (Grade II) layered plaque involving the descending  aorta. )       Anesthesia Quick Evaluation

## 2020-12-29 NOTE — Progress Notes (Signed)
Patient was complaining of nausea and vomiting. Patient was given Zofran earlier in the shift, but patient told the nurse that Zofran did not work for her. Nurse contacted the provider on call about getting something other than Zofran to help with the nausea and vomiting. Provider on call gave an order for compazine 10 mg IV injection once.

## 2020-12-29 NOTE — Transfer of Care (Signed)
Immediate Anesthesia Transfer of Care Note  Patient: April [name]  Procedure(s) Performed: EXPLORATORY LAPAROTOMY, SIGMOID COLECTOMY AND COLOSTOMY (N/A Abdomen)  Patient Location: PACU and ICU  Anesthesia Type:General  Level of Consciousness: sedated  Airway & Oxygen Therapy: Patient remains intubated per anesthesia plan  Post-op Assessment: Report given to RN and Post -op Vital signs reviewed and stable  Post vital signs: Reviewed and stable  Last Vitals:  Vitals Value Taken Time  BP    Temp    Pulse    Resp 16 12/29/20 1429  SpO2    Vitals shown include unvalidated device data.  Last Pain:  Vitals:   12/29/20 1200  TempSrc: Axillary  PainSc: 8       Patients Stated Pain Goal: 0 (84/72/07 2182)  Complications: No complications documented.

## 2020-12-29 NOTE — Anesthesia Procedure Notes (Signed)
Central Venous Catheter Insertion Performed by: Effie Berkshire, MD, anesthesiologist Start/End2/04/2021 12:35 PM, 12/29/2020 12:50 PM Patient location: Pre-op. Preanesthetic checklist: patient identified, IV checked, site marked, risks and benefits discussed, surgical consent, monitors and equipment checked, pre-op evaluation, timeout performed and anesthesia consent Position: Trendelenburg Lidocaine 1% used for infiltration and patient sedated Hand hygiene performed , maximum sterile barriers used  and Seldinger technique used Catheter size: 8 Fr Total catheter length 16. Central line was placed.Double lumen Procedure performed using ultrasound guided technique. Ultrasound Notes:anatomy identified, needle tip was noted to be adjacent to the nerve/plexus identified, no ultrasound evidence of intravascular and/or intraneural injection and image(s) printed for medical record Attempts: 1 Following insertion, dressing applied, line sutured and Biopatch. Post procedure assessment: blood return through all ports  Patient tolerated the procedure well with no immediate complications. Additional procedure comments: Attempted placement R IJ, unable to advance wire beyond 10 cm  Placed L IJ uneventfully. [name]

## 2020-12-29 NOTE — Anesthesia Procedure Notes (Signed)
Procedure Name: Intubation Date/Time: 12/29/2020 12:16 PM Performed by: [place] Keniel Ralston C, CRNA Pre-anesthesia Checklist: Patient identified, Emergency Drugs available, Suction available and Patient being monitored Patient Re-evaluated:Patient Re-evaluated prior to induction Oxygen Delivery Method: Circle system utilized Preoxygenation: Pre-oxygenation with 100% oxygen Induction Type: IV induction, Combination inhalational/ intravenous induction and Rapid sequence Laryngoscope Size: Mac and 3 Grade View: Grade I Tube type: Oral Number of attempts: 1 Airway Equipment and Method: Stylet and Oral airway Placement Confirmation: ETT inserted through vocal cords under direct vision,  positive ETCO2 and breath sounds checked- equal and bilateral Secured at: 22 cm Tube secured with: Tape Dental Injury: Teeth and Oropharynx as per pre-operative assessment

## 2020-12-29 NOTE — Progress Notes (Signed)
Patient ID: April [name], female   DOB: Dec 03, 1955, 65 y.o.   MRN: [id number]   General surgery was asked to see this patient again urgently secondary to worsening clinical condition and CT scan showing free air. Again, she has a 65 year old female who was admitted with diverticulitis.  We had originally seen her but then signed off on 31 January as she was clinically improving.  Over the last several hours she has clinically changed.  She has had abdominal pain with nausea and vomiting.  She is becoming more somnolent. She had a CT scan which now shows free fluid with free air and a possible developing abscess next to the bladder.  On physical examination, she is somnolent.  Her abdomen is diffusely tender with guarding throughout.   Impression: Perforated diverticulitis  At this point, she is now acutely ill with peritonitis and free air on CT scan.  I discussed this with her and I also discussed that by phone with her daughter.  An emergent exploratory laparotomy is recommended.  I discussed the procedure with them in detail.  I discussed the need for resection and colostomy. I discussed the significant risks which includes but is not limited to bleeding, infection, the need for further procedures, worsening cardiopulmonary issues, prolonged intubation, DVT, and even death. I also discussed the likelihood of her condition continued to worsen without surgery.  They agree to proceed.

## 2020-12-29 NOTE — Progress Notes (Signed)
PCCM:  Preliminary vent orders given. Full consult to follow   Garner Nash, DO [facility] 12/29/2020 2:11 PM

## 2020-12-29 NOTE — Consult Note (Signed)
NAME:  April [name], MRN:  [id number], DOB:  Apr 17, 1956, LOS: 84 ADMISSION DATE:  12/16/2020, CONSULTATION DATE:  12/29/2020 REFERRING MD:  Dr. Nevada Crane, CHIEF COMPLAINT:  Perf bowel, vent management    Brief History:  65 year old female past medical history of COPD, chronic hypoxemic respiratory failure on 2 L nasal cannula, ESRD, status post deceased donor renal transplant in 2013 on immune suppression followed by [facility].  She has chronic diastolic heart failure, MGUS, hypertension.  Recently seen in the ER for uncomplicated sigmoid diverticulitis returned to the hospital with pain and fever.  Admitted to the hospital started on antibiotics developed atrial fibrillation with RVR.  Had a successful TEE with [facility] cardioversion.  On the morning of 12/29/2020 patient developed acute abdominal pain had CT imaging that was concerning for free air within the belly and a perforated diverticulitis.  History of Present Illness:  65 year old female past medical history of COPD chronic hypoxic respiratory failure 2 L nasal cannula, ESRD status post deceased donor renal transplant 2013 with immune suppression followed by [facility].  Patient has chronic diastolic heart failure MGUS hypertension and recently mid to the hospital for sigmoid diverticulitis.  She returned to the hospital after having ongoing fever and pain.  She was admitted with IV antibiotics.  During his hospitalization chills developed A. fib RVR and received TEE with DCCV V cardioversion. on the morning of 12/29/2018 patient had CT scan of the abdomen which revealed perforated diverticula concerning for free air within the abdomen.  General surgery was consulted patient was taken to the operating room on 12/29/2020 for perforated sigmoid colon.  She had an exploratory laparotomy with a sigmoid colitis to me and a end colostomy by Dr. Ninfa Linden.  Postoperatively the patient was intubated and transferred to the intensive care unit for further management.   Postoperatively the patient was hypotensive central venous access placed and currently placed on vasopressor infusion  Past Medical History:   Past Medical History:  Diagnosis Date  . Arthritis   . Asthma   . CHF (congestive heart failure) ([place])   . COPD (chronic obstructive pulmonary disease) ([place])   . Depression   . Essential hypertension 02/08/2020  . GERD (gastroesophageal reflux disease)   . Hyperlipidemia   . Hyperparathyroidism   . Polycystic kidney   . PONV (postoperative nausea and vomiting)      Significant Hospital Events:  ICU admission 12/29/2020  Consults:  General surgery Cardiology Nephrology  Procedures:  12/29/2020: Exploratory laparotomy, sigmoid colectomy and end colostomy, central venous line, arterial line  Significant Diagnostic Tests:    Micro Data:  COVID-19 negative  Antimicrobials:  Piperacillin tazobactam  Interim History / Subjective:  Per HPI above  Objective   Blood pressure (!) 141/61, pulse 71, temperature (!) 96.9 F (36.1 C), temperature source Axillary, resp. rate 16, height 5\' 1"  (1.549 m), weight 77.2 kg, SpO2 100 %.    FiO2 (%):  [28 %] 28 %   Intake/Output Summary (Last 24 hours) at 12/29/2020 1417 Last data filed at 12/29/2020 1352 Gross per 24 hour  Intake 2078.07 ml  Output 1300 ml  Net 778.07 ml   Filed Weights   12/27/20 0515 12/28/20 0500 12/29/20 0500  Weight: 78.4 kg 77 kg 77.2 kg    Examination: General: Chronically ill-appearing debilitated looking female, multiple scattered bruising intubated on mechanical life support critically ill HENT: Endotracheal tube in place, NCAT Lungs: Bilateral mechanically ventilated breath sound Cardiovascular: Rhythm, S1-S2 Abdomen: Abdomen postoperative bandage in place, colostomy, blood and  dark stool Extremities: Dependent lower extremity edema, scattered bruising Neuro: Sedated on mechanical life support GU: Deferred  Resolved Hospital Problem list     Assessment &  Plan:   Acute hypoxemic respiratory failure requiring intubation and mechanical ventilation postoperatively secondary to sepsis, septic shock from peritonitis, perforated sigmoid colon status post repair with sigmoid colectomy and end colostomy on 12/29/2020 On chronic steroids, in stress state, chronic adrenal suppression Leukocytosis secondary above Plan: Adult mechanical ventilator protocol Low tidal volume ventilation Wean PEEP and FiO2 to maintain sats above 90% SBT SAT when mental status and vent mechanics are appropriate Vasopressors to maintain mean arterial pressure greater than 65 mmHg. Continue piperacillin tazobactam. We will give her hydrocortisone stress dose for the next 24 hours to see if this improves BP response postoperatively  History of renal transplant. Immunosuppressed state Plan: Appreciate management by nephrology. Continue mycophenolate plus tacrolimus +7.5 mg prednisone daily.  Diabetes type 2 with hyperglycemia Steroid-induced hyperglycemia. Plan: CBGs with SSI Lantus 13 units twice daily  Atrial flutter with RVR status post TEE and DCCV on 12/25/2020 Plan: Anticoagulation was held for surgery today. ? general surgery will be when to restart heparin infusion. ? possibly 12/30/2020 This will need to be addressed.  AKI Plan: Observe urine output, BMP I's and O's Avoid nephrotoxic agents   Best practice (evaluated daily)  Diet: NPO, will advance per surgery recommendations Pain/Anxiety/Delirium protocol (if indicated): PAD guidelines VAP protocol (if indicated): Yes DVT prophylaxis: Was on Eliquis, holding anticoagulation postop GI prophylaxis: PPI Glucose control: CBGs with SSI Mobility: Bedrest Disposition: ICU  Goals of Care:  Last date of multidisciplinary goals of care discussion: Family and staff present:  Summary of discussion:  Follow up goals of care discussion due:  Code Status: Full  Labs   CBC: Recent Labs  Lab 12/25/20 0231  12/26/20 0427 12/27/20 0426 12/28/20 0513 12/29/20 0521  WBC 3.8* 4.0 5.4 16.0* 15.9*  HGB 8.4* 8.8* 9.0* 10.2* 9.7*  HCT 27.8* 29.3* 29.1* 32.7* 31.1*  MCV 101.8* 104.3* 102.1* 101.9* 102.0*  PLT 148* 148* 121* 224 220    Basic Metabolic Panel: Recent Labs  Lab 12/25/20 0231 12/26/20 0427 12/27/20 0636 12/28/20 0504 12/29/20 0521  NA 128* 129* 133* 132* 136  K 4.8 5.1 5.3* 5.3* 4.7  CL 96* 98 99 100 101  CO2 22 25 23 23 27   GLUCOSE 287* 247* 281* 288* 156*  BUN 101* 102* 101* 105* 95*  CREATININE 2.38* 2.34* 2.07* 1.53* 1.42*  CALCIUM 9.7 9.8 9.9 9.9 10.0  MG 2.5*  --  2.5* 2.4  --   PHOS 4.6  --   --   --  2.3*   GFR: Estimated Creatinine Clearance: 38.2 mL/min (A) (by C-G formula based on SCr of 1.42 mg/dL (H)). Recent Labs  Lab 12/26/20 0427 12/27/20 0426 12/28/20 0513 12/29/20 0521  WBC 4.0 5.4 16.0* 15.9*    Liver Function Tests: Recent Labs  Lab 12/23/20 0510 12/24/20 0209 12/29/20 0521 12/29/20 0822  AST 16 13*  --  46*  ALT 15 14  --  27  ALKPHOS 67 59  --  65  BILITOT 0.8 0.9  --  1.2  PROT 7.0 6.5  --  6.2*  ALBUMIN 2.9* 2.8* 2.9* 2.9*   Recent Labs  Lab 12/29/20 0822  LIPASE 49   No results for input(s): AMMONIA in the last 168 hours.  ABG    Component Value Date/Time   PHART 7.344 (L) 12/29/2020 1137   PCO2ART 49.0 (  H) 12/29/2020 1137   PO2ART 124 (H) 12/29/2020 1137   HCO3 26.0 12/29/2020 1137   TCO2 24 10/06/2020 2019   ACIDBASEDEF 4.3 (H) 12/23/2020 1602   O2SAT 98.5 12/29/2020 1137     Coagulation Profile: Recent Labs  Lab 12/25/20 0810  INR 1.5*    Cardiac Enzymes: No results for input(s): CKTOTAL, CKMB, CKMBINDEX, TROPONINI in the last 168 hours.  HbA1C: Hgb A1c MFr Bld  Date/Time Value Ref Range Status  12/16/2020 09:02 AM 5.6 4.8 - 5.6 % Final    Comment:    (NOTE) Pre diabetes:          5.7%-6.4%  Diabetes:              >6.4%  Glycemic control for   <7.0% adults with diabetes   09/10/2020 02:47 PM  5.6 4.8 - 5.6 % Final    Comment:    (NOTE)         Prediabetes: 5.7 - 6.4         Diabetes: >6.4         Glycemic control for adults with diabetes: <7.0     CBG: Recent Labs  Lab 12/27/20 2120 12/28/20 0752 12/28/20 1145 12/28/20 2015 12/29/20 0751  GLUCAP 253* 273* 310* 258* 153*    Review of Systems:   Unable to be obtained secondary to critical illness  Past Medical History:  She,  has a past medical history of Arthritis, Asthma, CHF (congestive heart failure) ([place]), COPD (chronic obstructive pulmonary disease) ([place]), Depression, Essential hypertension (02/08/2020), GERD (gastroesophageal reflux disease), Hyperlipidemia, Hyperparathyroidism, Polycystic kidney, and PONV (postoperative nausea and vomiting).   Surgical History:   Past Surgical History:  Procedure Laterality Date  . 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 BILATERAL OOPHORECTOMY    . BIOPSY  05/19/2020   Procedure: BIOPSY;  Surgeon: Carol Ada, MD;  Location: [place];  Service: Endoscopy;;  . CARDIOVERSION N/A 12/25/2020   Procedure: CARDIOVERSION;  Surgeon: Geralynn Rile, MD;  Location: [place];  Service: Cardiovascular;  Laterality: N/A;  . CARPAL TUNNEL RELEASE  2000  . CESAREAN SECTION    . COLONOSCOPY WITH PROPOFOL N/A 05/19/2020   Procedure: COLONOSCOPY WITH PROPOFOL;  Surgeon: Carol Ada, MD;  Location: [place];  Service: Endoscopy;  Laterality: N/A;  . ENTEROSCOPY N/A 05/19/2020   Procedure: ENTEROSCOPY;  Surgeon: Carol Ada, MD;  Location: [place];  Service: Endoscopy;  Laterality: N/A;  . INSERTION OF DIALYSIS CATHETER  03/31/2012   Procedure: INSERTION OF DIALYSIS CATHETER;  Surgeon: Angelia Mould, MD;  Location: [place];  Service: Vascular;  Laterality: N/A;  insertion of dialysis catheter right internal jugular vein  . 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 ORIF TIBIA FRACTURE Left 12/23/2018   Procedure: OPEN REDUCTION INTERNAL FIXATION  (ORIF) TIBIA FRACTURE;  Surgeon: Shona Needles, MD;  Location: [place];  Service: Orthopedics;  Laterality: Left;  . ORIF TIBIA PLATEAU Right 12/23/2018   Procedure: OPEN REDUCTION INTERNAL FIXATION (ORIF) TIBIAL PLATEAU;  Surgeon: Shona Needles, MD;  Location: [place];  Service: Orthopedics;  Laterality: Right;  . ORIF WRIST FRACTURE Left 12/08/2019   Procedure: OPEN REDUCTION INTERNAL FIXATION (ORIF) WRIST FRACTURE, REPAIR LACERATION LEFT FOREARM;  Surgeon: Dorna Leitz, MD;  Location: [facility];  Service: Orthopedics;  Laterality: Left;  . TEE WITHOUT CARDIOVERSION N/A 12/25/2020   Procedure: TRANSESOPHAGEAL ECHOCARDIOGRAM (TEE);  Surgeon: Geralynn Rile, MD;  Location: [place];  Service: Cardiovascular;  Laterality: N/A;  . TONSILLECTOMY  1967  . TOTAL HIP ARTHROPLASTY Left 12/08/2019   Procedure: TOTAL HIP ARTHROPLASTY ANTERIOR APPROACH;  Surgeon: Dorna Leitz, MD;  Location: [facility];  Service: Orthopedics;  Laterality: Left;  . TUBAL LIGATION  2010     Social History:   reports that she quit smoking about 22 years ago. Her smoking use included cigarettes. She has a 3.75 pack-year smoking history. She has never used smokeless tobacco. She reports that she does not drink alcohol and does not use drugs.   Family History:  Her family history includes Heart disease in her father; Hypertension in her mother; Polycystic kidney disease in her brother and father.   Allergies Allergies  Allergen Reactions  . Infed [Iron Dextran] Other (See Comments)    Chest tightness  . Pentamidine Itching, Shortness Of Breath and Swelling  . Budesonide-Formoterol Fumarate     Other reaction(s): hoarseness  . Bupropion     Other reaction(s): exacerbated depression  . Crestor [Rosuvastatin]     Other reaction(s): body aches, swelling  . Duloxetine Hcl     Other reaction(s): GI SE, weepy, worsening fatigue, foggy  . Erythromycin [Erythromycin] Other (See Comments)    Mouth Ulcers  . Iohexol Other  (See Comments)    Per patient "she has had a kidney transplant and should never have contrast"  . Oxycodone Nausea Only and Nausea And Vomiting  . Pravastatin Sodium     Other reaction(s): myalgias  . Erythromycin Rash    Causes breakout in mouth  . Ultram [Tramadol Hcl] Anxiety     Home Medications  Prior to Admission medications   Medication Sig Start Date End Date Taking? Authorizing Provider  acetaminophen (TYLENOL) 325 MG tablet Take 2 tablets (650 mg total) by mouth every 6 (six) hours as needed for mild pain (or Fever >/= 101). 05/22/20  Yes Elgergawy, Silver Huguenin, MD  albuterol (VENTOLIN HFA) 108 (90 Base) MCG/ACT inhaler Inhale 2 puffs into the lungs every 4 (four) hours as needed for wheezing or shortness of breath. 01/04/20  Yes Angiulli, Lavon Paganini, PA-C  allopurinol (ZYLOPRIM) 100 MG tablet Take 100 mg by mouth daily. 11/29/20  Yes [provider]  amoxicillin (AMOXIL) 500 MG capsule Take 1,000 mg by mouth See admin instructions. 1000mg  twice daily as needed for dentist 12/09/20  Yes [provider]  brimonidine (ALPHAGAN P) 0.1 % SOLN Place 1 drop into both eyes 2 (two) times daily.    Yes [provider]  Budeson-Glycopyrrol-Formoterol (BREZTRI AEROSPHERE) 160-9-4.8 MCG/ACT AERO Inhale 2 puffs into the lungs 2 (two) times daily. 08/20/20  Yes Martyn Ehrich, NP  cholecalciferol (VITAMIN D3) 25 MCG (1000 UNIT) tablet Take 1 tablet (1,000 Units total) by mouth daily. 01/04/20  Yes Angiulli, Lavon Paganini, PA-C  cyanocobalamin 1000 MCG tablet Take 1,000 mcg by mouth daily.   Yes [provider]  cycloSPORINE (RESTASIS) 0.05 % ophthalmic emulsion Place 1 drop into both eyes 2 (two) times daily. 01/04/20  Yes Angiulli, Lavon Paganini, PA-C  dorzolamide (TRUSOPT) 2 % ophthalmic solution Place 1 drop into both eyes daily. 06/25/19  Yes [provider]  ferrous sulfate 325 (65 FE) MG tablet Take 325 mg by mouth daily with breakfast.   Yes [provider]   FLUoxetine (PROZAC) 40 MG capsule Take 1 capsule (40 mg total) by mouth daily. 01/04/20  Yes Angiulli, Lavon Paganini, PA-C  furosemide (LASIX) 80 MG tablet Take 80 mg by mouth daily. 10/30/20  Yes [provider]  gabapentin (NEURONTIN) 100 MG capsule Take 200 mg by mouth 2 (two) times daily.   Yes [provider]  guaiFENesin (MUCINEX) 600 MG 12 hr tablet Take 600 mg by mouth 2 (two) times daily.   Yes [provider]  ipratropium-albuterol (DUONEB) 0.5-2.5 (3) MG/3ML SOLN Take 3 mLs by nebulization every 6 (six) hours as needed (sob/wheezing).  08/24/20  Yes [provider]  isosorbide mononitrate (IMDUR) 60 MG 24 hr tablet Take 1 tablet (60 mg total) by mouth daily. 05/23/20  Yes Elgergawy, Silver Huguenin, MD  magnesium oxide (MAG-OX) 400 MG tablet Take 1 tablet (400 mg total) by mouth 2 (two) times daily. 01/04/20  Yes Angiulli, Lavon Paganini, PA-C  mycophenolate (MYFORTIC) 180 MG EC tablet Take 180 mg by mouth 2 (two) times daily.   Yes [provider]  pantoprazole (PROTONIX) 40 MG tablet Take 1 tablet (40 mg total) by mouth 2 (two) times daily. 01/04/20  Yes Angiulli, Lavon Paganini, PA-C  predniSONE (DELTASONE) 10 MG tablet 40mg X3 days, 30mg  X3 days, 20mg  X3 days, 10mg X3 days, then stop. Patient taking differently: Take 10-40 mg by mouth See admin instructions. 40mg X3 days, 30mg  X3 days, 20mg  X3 days, 10mg X3 days, then stop. 12/06/20  Yes Freddi Starr, MD  PROGRAF 1 MG capsule Take 1 mg by mouth 2 (two) times daily.  03/22/19  Yes [provider]  promethazine (PHENERGAN) 25 MG tablet Take 1 tablet (25 mg total) by mouth every 8 (eight) hours as needed for up to 15 doses for nausea or vomiting. 09/02/20  Yes Curatolo, Adam, DO  TRAVATAN Z 0.004 % SOLN ophthalmic solution Place 1 drop into both eyes at bedtime. 07/03/16  Yes [provider]  valACYclovir (VALTREX) 500 MG tablet Take 500 mg by mouth 2 (two) times daily.   Yes [provider]   verapamil (CALAN-SR) 240 MG CR tablet Take 240 mg by mouth daily.   Yes [provider]  ondansetron (ZOFRAN ODT) 4 MG disintegrating tablet Take 1 tablet (4 mg total) by mouth every 8 (eight) hours as needed for nausea or vomiting. Patient not taking: Reported on 12/16/2020 12/15/20   Maudie Flakes, MD     This patient is critically ill with multiple organ system failure; which, requires frequent high complexity decision making, assessment, support, evaluation, and titration of therapies. This was completed through the application of advanced monitoring technologies and extensive interpretation of multiple databases. During this encounter critical care time was devoted to patient care services described in this note for 32 minutes.  Garner Nash, DO [facility] 12/29/2020 5:24 PM

## 2020-12-29 NOTE — Anesthesia Procedure Notes (Addendum)
Arterial Line Insertion Start/End2/04/2021 12:58 PM, 12/29/2020 1:03 PM Performed by: Effie Berkshire, MD, anesthesiologist  Patient location: Pre-op. Preanesthetic checklist: patient identified, IV checked, site marked, risks and benefits discussed, surgical consent, monitors and equipment checked, pre-op evaluation, timeout performed and anesthesia consent Lidocaine 1% used for infiltration Right, radial was placed Catheter size: 20 Fr Hand hygiene performed  and maximum sterile barriers used   Attempts: 1 Procedure performed without using ultrasound guided technique. Following insertion, dressing applied. Post procedure assessment: normal and unchanged  Patient tolerated the procedure well with no immediate complications.

## 2020-12-29 NOTE — Anesthesia Postprocedure Evaluation (Signed)
Anesthesia Post Note  Patient: April [name]  Procedure(s) Performed: EXPLORATORY LAPAROTOMY, SIGMOID COLECTOMY AND COLOSTOMY (N/A Abdomen)     Patient location during evaluation: SICU Anesthesia Type: General Level of consciousness: sedated Pain management: pain level controlled Vital Signs Assessment: post-procedure vital signs reviewed and stable Respiratory status: patient remains intubated per anesthesia plan Cardiovascular status: stable Postop Assessment: no apparent nausea or vomiting Anesthetic complications: no   No complications documented.  Last Vitals:  Vitals:   12/29/20 1106 12/29/20 1200  BP: 126/65 (!) 141/61  Pulse: 71   Resp: 14 16  Temp: 36.7 C (!) 36.1 C  SpO2: 100%     Last Pain:  Vitals:   12/29/20 1200  TempSrc: Axillary  PainSc: [place] Shannah Conteh

## 2020-12-29 NOTE — Op Note (Signed)
EXPLORATORY LAPAROTOMY, SIGMOID COLECTOMY AND COLOSTOMY  Procedure Note  April [name] 12/29/2020   Pre-op Diagnosis: perforated sigmoid diverticulitis     Post-op Diagnosis: perforated sigmoid  Procedure(s): EXPLORATORY LAPAROTOMY SIGMOID COLECTOMY AND END COLOSTOMY  Surgeon(s): Coralie Keens, MD  Anesthesia: General  Staff:  Circulator: Rickard Rhymes, RN Scrub Person: Joellen Jersey, CST; Lamount Cranker E  Estimated Blood Loss: less than 50 mL               Specimens: sent to path  Indications: This is a 65 year old female with multiple medical problems who is on chronic immunosuppression for kidney transplant.  She presented with diverticulitis which was for confirmed to be mild on the CT scan on 1/24.  She improved from the diverticulitis clinically until this morning when she developed abdominal pain with nausea and vomiting.  A repeat CT scan showed free air and possible abscess formation and free fluid consistent with perforation.  She had peritonitis on physical examination.  The decision was made to proceed emergently to the operating room.  Findings: The patient was found to have a focal perforation with a moderate sized hole in the sigmoid colon.  There was turbid fluid in the abdominal cavity with an odor .but no gross spillage of stool.  Procedure: The patient was brought to the operating room and identifies correct patient.  She is placed upon the operating table and general anesthesia was induced.  Her abdomen was prepped and draped in usual sterile fashion.  I made a lower midline incision starting just above the umbilicus and including a moderate sized umbilical hernia.  I took the incision down through the fascia with electrocautery.  There was omentum in the hernia sac which I freed up with the cautery.  After entering the abdominal cavity the patient was found to have a fair amount of turbid intra-abdominal fluid with a foul odor.  The perforation in  the sigmoid colon was readily identified.  It was a moderate sized hole at the medial side of the colon near the mesentery.  I transected the sigmoid colon distal to the perforation with the GIA-75 stapler.  I controlled some bleeding in the mesentery of the distal end with several silk sutures.  I then transected the sigmoid colon proximal to the perforation with a GIA-75 stapler.  I then took down the mesentery with the LigaSure.  I placed a 3-0 Prolene suture at the midportion of the Hartman's pouch.  I had to minimally mobilized the sigmoid colon along the white line of Toldt.  I then made an elliptical incision in the patient's left lower abdomen with the cautery.  I took this down to the fascia which was then opened in a cruciate fashion.  The underlying muscles then separated the peritoneum was opened.  I then pulled out the proximal sigmoid colon through this opening as the colostomy.  I then copiously irrigated the abdomen with several liters of normal saline.  Hemostasis appeared to be achieved.  The patient's midline fascia was then closed with a running number looped PDS suture as well as interrupted #1 Novafil internal retention sutures.  The skin was left open and packed with a wet-to-dry saline soaked Kerlix.  I then took down the staple line at the ostomy with the cautery.  I then matured the ostomy circumferentially with 3-0 Vicryl sutures.  The ostomy appeared pink and well perfused.  An ostomy appliance was applied.  Dry gauze was placed over the midline wound.  The patient tolerated the procedure well.  All the counts were correct at the end of the procedure.  The patient was then left intubated and taken in a guarded condition from the operating room to the intensive care unit.          Coralie Keens   Date: 12/29/2020  Time: 1:56 PM

## 2020-12-29 NOTE — Plan of Care (Signed)

## 2020-12-29 NOTE — Progress Notes (Signed)
Patient ID: April [name], female   DOB: July 24, 1956, 65 y.o.   MRN: [id number] S: Not feeling well this morning, complaining of worsening abdominal pain and nausea. O:BP (!) 159/61 (BP Location: Left Leg)   Pulse 68   Temp 97.9 F (36.6 C) (Oral)   Resp 18   Ht 5\' 1"  (1.549 m)   Wt 77.2 kg   SpO2 96%   BMI 32.16 kg/m   Intake/Output Summary (Last 24 hours) at 12/29/2020 0913 Last data filed at 12/29/2020 0600 Gross per 24 hour  Intake 578.07 ml  Output 1050 ml  Net -471.93 ml   Intake/Output: I/O last 3 completed shifts: In: 1178.1 [P.O.:1140; IV Piggyback:38.1] Out: 2750 [Urine:2750]  Intake/Output this shift:  No intake/output data recorded. Weight change: 0.2 kg Gen: ill-appearing in mild distress CVS:RRR Resp: cta Abd: +BS, +tenderness to light palpation, guarding, no rebound Ext:1+ BLE edema  Recent Labs  Lab 12/22/20 1149 12/23/20 0510 12/24/20 4174 12/25/20 0231 12/26/20 0427 12/27/20 0636 12/28/20 0504 12/29/20 0521 12/29/20 0822  NA 127* 128* 131* 128* 129* 133* 132* 136  --   K 5.1 5.0 4.7 4.8 5.1 5.3* 5.3* 4.7  --   CL 95* 95* 98 96* 98 99 100 101  --   CO2 22 22 21* 22 25 23 23 27   --   GLUCOSE 309* 345* 230* 287* 247* 281* 288* 156*  --   BUN 85* 81* 89* 101* 102* 101* 105* 95*  --   CREATININE 2.74* 2.56* 2.52* 2.38* 2.34* 2.07* 1.53* 1.42*  --   ALBUMIN 2.7* 2.9* 2.8*  --   --   --   --  2.9* 2.9*  CALCIUM 9.0 9.4 9.8 9.7 9.8 9.9 9.9 10.0  --   PHOS  --   --   --  4.6  --   --   --  2.3*  --   AST 13* 16 13*  --   --   --   --   --  46*  ALT 12 15 14   --   --   --   --   --  27   Liver Function Tests: Recent Labs  Lab 12/23/20 0510 12/24/20 0209 12/29/20 0521 12/29/20 0822  AST 16 13*  --  46*  ALT 15 14  --  27  ALKPHOS 67 59  --  65  BILITOT 0.8 0.9  --  1.2  PROT 7.0 6.5  --  6.2*  ALBUMIN 2.9* 2.8* 2.9* 2.9*   Recent Labs  Lab 12/29/20 0822  LIPASE 49   No results for input(s): AMMONIA in the last 168 hours. CBC: Recent  Labs  Lab 12/25/20 0231 12/26/20 0427 12/27/20 0426 12/28/20 0513 12/29/20 0521  WBC 3.8* 4.0 5.4 16.0* 15.9*  HGB 8.4* 8.8* 9.0* 10.2* 9.7*  HCT 27.8* 29.3* 29.1* 32.7* 31.1*  MCV 101.8* 104.3* 102.1* 101.9* 102.0*  PLT 148* 148* 121* 224 177   Cardiac Enzymes: No results for input(s): CKTOTAL, CKMB, CKMBINDEX, TROPONINI in the last 168 hours. CBG: Recent Labs  Lab 12/27/20 2120 12/28/20 0752 12/28/20 1145 12/28/20 2015 12/29/20 0751  GLUCAP 253* 273* 310* 258* 153*    Iron Studies: No results for input(s): IRON, TIBC, TRANSFERRIN, FERRITIN in the last 72 hours. Studies/Results: No results found. [name] allopurinol  100 mg Oral Daily  . amLODipine  10 mg Oral Daily  . amoxicillin-clavulanate  1 tablet Oral BID  . apixaban  5 mg Oral BID  . arformoterol  15 mcg Nebulization BID   And  . umeclidinium bromide  1 puff Inhalation Daily   And  . budesonide (PULMICORT) nebulizer solution  0.5 mg Nebulization BID  . brimonidine  1 drop Both Eyes BID  . Chlorhexidine Gluconate Cloth  6 each Topical Daily  . cycloSPORINE  1 drop Both Eyes BID  . dorzolamide  1 drop Both Eyes Daily  . ferrous sulfate  325 mg Oral Q breakfast  . FLUoxetine  40 mg Oral Daily  . gabapentin  200 mg Oral BID  . guaiFENesin  600 mg Oral BID  . hydrALAZINE  100 mg Oral Q8H  . insulin aspart  0-5 Units Subcutaneous QHS  . insulin aspart  0-9 Units Subcutaneous TID WC  . insulin aspart  8 Units Subcutaneous TID WC  . insulin glargine  13 Units Subcutaneous BID  . isosorbide mononitrate  30 mg Oral Daily  . latanoprost  1 drop Both Eyes QHS  . mouth rinse  15 mL Mouth Rinse BID  . metoprolol tartrate  100 mg Oral BID  . mycophenolate  180 mg Oral BID  . pantoprazole  40 mg Oral BID  . polyethylene glycol  17 g Oral Daily  . predniSONE  7.5 mg Oral Q breakfast  . saccharomyces boulardii  250 mg Oral BID  . senna-docusate  2 tablet Oral BID  . tacrolimus  1 mg Oral BID  . valACYclovir  500 mg Oral  BID  . cyanocobalamin  1,000 mcg Oral Daily    BMET    Component Value Date/Time   NA 136 12/29/2020 0521   K 4.7 12/29/2020 0521   CL 101 12/29/2020 0521   CO2 27 12/29/2020 0521   GLUCOSE 156 (H) 12/29/2020 0521   BUN 95 (H) 12/29/2020 0521   CREATININE 1.42 (H) 12/29/2020 0521   CREATININE 1.28 (H) 11/04/2020 0952   CALCIUM 10.0 12/29/2020 0521   CALCIUM 10.8 (H) 10/13/2011 1405   GFRNONAA 42 (L) 12/29/2020 0521   GFRNONAA 47 (L) 11/04/2020 0952   GFRAA 51 (L) 08/05/2020 1523   CBC    Component Value Date/Time   WBC 15.9 (H) 12/29/2020 0521   RBC 3.05 (L) 12/29/2020 0521   HGB 9.7 (L) 12/29/2020 0521   HGB 9.0 (L) 11/04/2020 0952   HGB 10.6 (L) 12/29/2010 1522   HCT 31.1 (L) 12/29/2020 0521   HCT 31.2 (L) 12/29/2010 1522   PLT 177 12/29/2020 0521   PLT 241 11/04/2020 0952   PLT 205 12/29/2010 1522   MCV 102.0 (H) 12/29/2020 0521   MCV 94 12/29/2010 1522   MCH 31.8 12/29/2020 0521   MCHC 31.2 12/29/2020 0521   RDW 18.8 (H) 12/29/2020 0521   RDW 12.8 12/29/2010 1522   LYMPHSABS 1.7 12/17/2020 0425   LYMPHSABS 1.1 12/29/2010 1522   MONOABS 1.0 12/17/2020 0425   EOSABS 0.1 12/17/2020 0425   EOSABS 0.2 12/29/2010 1522   BASOSABS 0.0 12/17/2020 0425   BASOSABS 0.0 12/29/2010 1522      Assessment/Plan:  1. Acute colitis- now with worsening abdominal pain and nausea.  For repeat CT of abdomen today. 2. AKI/CKD stage 3a- multifactorial, in setting of intravascular volume depletion from poor po intake as well as N/V as well as hypotension from sepsis/hypoperfusion and 3rd spacing from acute on chronic diastolic CHF. Her Scr was improving until IV lasix was started on 12/21/20. Scr peaked at 2.71 but has slowly been improving. Difficult situation as she does have peripheral edema but remains  hypotensive and likely intravascularly depleted.  1. Continue supportive care - creatinine downtrendingand elevated BUN may be in part with high dose steroids 2. Lasix given  12/27/20 and given IV albumin on 12/28/20 with good UOP.   3. Hold lasix today due to poor po intake and abdominal pain. 4. BUN/Cr improving (Scr at baseline). 5. Adrenal insufficiency.Low cortisol AM. Noted renin/aldoinprocess 3. Atrial flutter with RVR- started on heparin gtt and s/p successful DCCV2/2/22. Cardiology following and pt now on metoprolol 4. Hypotension- resolved 5. Acute on chronic diastolic CHF- lasix 40 mg IV once today and goal of transitioning to lasix 80 mg daily - her home dose  6. S/p kidney transplant- Continue prograf/myfortic. Check prograf level. Note on solumedrol taper-willtransition to prednisone 7.5 mg daily - her home dose is 7.5 mg daily and should not be tapered lower than that 7. Hyponatremia- due to CHF/edema.improved 8. COPD- per PCCM 9. Deconditioning- very weak and will likely require PT/OT evaluation when stable.   Donetta Potts, MD [employer] [phone]

## 2020-12-29 NOTE — Progress Notes (Addendum)
PROGRESS NOTE  April [name] ZOX:[id number] DOB: 31-Dec-1955 DOA: 12/16/2020 PCP: [name], MD  HPI/Recap of past 36 hours: 65 year old female with history of COPD/chronic RF on 2 L [place], ESRDs/pdeceased donor renal transplant in 05/2012 on immunosuppressants followed up at [facility], CKD-3Afollowed by Dr. Royce Macadamia, chronic diastolic CHF,MGUS,HTN, andrecent ED visit for uncomplicated sigmoid diverticulitis on 12/14/2020 returning with LLQ pain, vomiting, shortness of breath and fever. She was started on Cipro and Flagyl 2 days prior but was unable to take due to nausea and vomiting.  CT abdomen and pelvis wo contrast (12/16/20) showed mild uncomplicated sigmoid diverticulitis, polycystic kidney or liver disease as before and unremarkable appearance of right pelvic transplanted kidney. LE USconsistent with age-indeterminate nonocclusive DVT in the right popliteal vein not noted on previous ultrasound in 05/13/2020. TTE (12/17/20) with LVEF of 70 to 75%, G2 DD, normal RVSP.VQ scan low probability for pulmonary embolism.   Hospital course complicated by A. fib with RVR for which she was started on Cardizem drip.  Had a successful TEE/DCCV cardioversion on 12/25/2020.   On 12/28/20, patient had rise in her WBC up to 16 K, no significant abdominal pain or nausea.  To note vomited once the morning of 12/27/20 and no significant abdominal pain with palpation.  On 12/29/20 patient reports severe abdominal pain for which a CT abd/pelvis without contrast was obtain to reassess her recently diagnosed sigmoid diverticulitis.  CT scan revealed pneumoperitoneum compatible with bowel perforation.  General surgery consulted.  Started on Zosyn.  Patient transferred to ICU and taken to OR emergently.     Assessment/Plan: Active Problems:   GERD without esophagitis   Renal transplant recipient   Depression   Sepsis ([place])   SOB (shortness of breath)   Acute diverticulitis   Sigmoid diverticulitis   Acute CHF  (congestive heart failure) (HCC)   Bilious vomiting with nausea   Sepsis Newly diagnosed Bowel perforation in the setting of recently diagnosed sigmoid diverticulitis, POA WBC 15K, RR 27 CT Abd/pelv without contrast (12/29/20) revealed pneumoperitoneum compatible with bowel perforation.  General surgery consulted.   Started on Zosyn, day#1.   Patient transferred to ICU and taken to OR emergently.  Patient's husband updated, will visit with daughter per visitation protocol post op.  Possible developing abscess anterior to the urinary bladder Seen on CT scan (12/29/20) measuring 4.1 x 2.9 x 2.8 cm IV Zosyn, day#1 Taken emergently to the OR on 12/29/20 for bowel perforation, Dr. Ninfa Linden.  Right popliteal DVT age indeterminate nonocclusive.   VQ scan low probability for PE.  Hep drip was DCed on 12/18/20 On Eliquis, last dose was last night 2212. Andexxa 800 mg IV or 400 mg IV to reverse with pharmacy's assistance if needed.  Adrenal insufficiency Back on home dose prednisone 7.5 mg daily on 12/28/2020 No longer hypotensive. May need stress dose steroids after surgery Defer management to Critical Care Medicine  Uncontrolled Essential hypertension BP improved with current regimen: Norvasc 10 mg daily, p.o. hydralazine 100 mg 3 times daily, Imdur 30 mg daily, p.o. Lopressor 100 mg twice daily.   Continue regimen recommended by cardiology Closely monitor vital signs post op.  Acute on chronic diastolic heart failure-   Most recent 2D echo with ejection fraction 70 to 75% and grade 2 diastolic dysfunction. Appreciate cardiology's assistance Current regimen: Norvasc 10 mg daily, p.o. hydralazine 100 mg 3 times daily, Imdur 30 mg daily, p.o. Lopressor 100 mg twice daily.   Diuretics on hold due to recent AKI. Continue strict  I's and O's and daily weight Net I&O + 340CC  Improving AKI on CKD stage IIIB with history of renal transplant (06/02/2012) Multifactorial and partially contributed by  hypotension Baseline creatinine 1.4 with GFR of 42. She is back to her baseline creatinine 1.42 with GFR of 42. Creatinine 2.74 on 12/22/20. Continue to avoid nephrotoxins. Home CellCept, Prograf, prednisone, valacyclovir.  Sigmoid diverticulitis, present on admission. Initially presented to the ED with LLQ abd pain, N&V Admitted for treatment of uncomplicated sigmoid diverticulitis   Seen by general surgery Initially on Zosyn which was changed to Unasyn in the setting of AKI Her leukocytosis had resolved and she was started on Augmentin. Severe abd pain on 12/29/20 which prompted repeat CT abd pelvis which revealed bowel perforation. General surgery re-consulted.  Taken to the OR emergently.  Anemia of chronic disease in the setting of CKD 3B/iron deficiency anemia Hemoglobin at baseline 10.2 K On ferrous supplement daily Hg 9.7K this AM Type and screen for surgery Transfuse Hg less than 7 Closely monitor H&H   Resolved post treatment hyperkalemia. Received 1 dose of Lokelma on 12/28/2020 Serum potassium 5.3>> 4.7.  Resolved A. fib with RVR, post TEE/DC cardioversion:  Prior to cardioversion A flutter-heart rate in 130s to 140s.  Previously on Cardizem drip Completed TEE/DC cardioversion on 12/25/2020. Continue Eliquis for CVA prevention.  Resolved acute on chronic hypoxic respiratory failure secondary to multifactorial etiology including COPD/diastolic heart failure  She is back to her baseline oxygen requirement, 2 L with saturation 100%.  Resolved hypervolemic hyponatremia.   Serum sodium 129> 133>136. IV Lasix on hold.  Steroid-induced hyperglycemia  Hemoglobin A1c 5.6 on 12/16/2020. Continue insulin coverage Continue Lantus and SSI.   Closely monitor and treat as indicated post op  GERD, stable  Stable, continue Protonix twice daily.  Moderate protein calorie malnutrition Albumin 2.8 Moderate muscle mass loss Continue to encourage increase oral protein calorie  intake once able to take po.   Pressure Injury 10/23/20 Sacrum Right;Left Stage 2 -  Partial thickness loss of dermis presenting as a shallow open injury with a red, pink wound bed without slough. (Active)  10/23/20 2058  Location: Sacrum  Location Orientation: Right;Left  Staging: Stage 2 -  Partial thickness loss of dermis presenting as a shallow open injury with a red, pink wound bed without slough.  Wound Description (Comments):   Present on Admission:     DVT prophylaxis: Eliquis, last dose was taken on 12/28/20 at 2212.  Held for emergent surgery.   Code Status: Full code  Consultants: -General surgery -Cardiology -Nephrology -PCCM   Status is: Inpatient  Dispo: The patient is from:Home              Anticipated d/c is QV:ZDGL with home health services              Anticipated d/c date is: Undetermined.  Reassess post-op.              Patient currently Not stable to dc due to ongoing management of perforated bowel requiring emergent surgical intervention.              Difficult to place patient N/A        Objective: Vitals:   12/29/20 0850 12/29/20 1056 12/29/20 1106 12/29/20 1200  BP: (!) 159/61 126/65 126/65 (!) 141/61  Pulse: 68  71   Resp:   14 16  Temp:   98.1 F (36.7 C) (!) 96.9 F (36.1 C)  TempSrc:   Axillary Axillary  SpO2: 96%  100%   Weight:      Height:        Intake/Output Summary (Last 24 hours) at 12/29/2020 1326 Last data filed at 12/29/2020 0600 Gross per 24 hour  Intake 578.07 ml  Output 1050 ml  Net -471.93 ml   Filed Weights   12/27/20 0515 12/28/20 0500 12/29/20 0500  Weight: 78.4 kg 77 kg 77.2 kg    Exam:  . General: 65 y.o. year-old female Chronically ill appearing.  Somnolent but arousable to voices.  She is able to answer orientation questions appropriately. .  Cardiovascular: Regular rate and rhythm no rubs or gallops. [name] Respiratory: Clear to auscultation no wheezes or rales.  Poor inspiratory effort. Belly  breathing. . Abdomen: Obese and mildly distended with hypoactive bowel sounds. Tender with palpation and guarding. . Musculoskeletal: 1+ pitting edema in upper and lower extremities  . Skin: Bruising involving upper extremities bilaterally. [name] Psychiatry: Unable to assess mood due to somnolence.  Data Reviewed: CBC: Recent Labs  Lab 12/25/20 0231 12/26/20 0427 12/27/20 0426 12/28/20 0513 12/29/20 0521  WBC 3.8* 4.0 5.4 16.0* 15.9*  HGB 8.4* 8.8* 9.0* 10.2* 9.7*  HCT 27.8* 29.3* 29.1* 32.7* 31.1*  MCV 101.8* 104.3* 102.1* 101.9* 102.0*  PLT 148* 148* 121* 224 423   Basic Metabolic Panel: Recent Labs  Lab 12/25/20 0231 12/26/20 0427 12/27/20 0636 12/28/20 0504 12/29/20 0521  NA 128* 129* 133* 132* 136  K 4.8 5.1 5.3* 5.3* 4.7  CL 96* 98 99 100 101  CO2 22 25 23 23 27   GLUCOSE 287* 247* 281* 288* 156*  BUN 101* 102* 101* 105* 95*  CREATININE 2.38* 2.34* 2.07* 1.53* 1.42*  CALCIUM 9.7 9.8 9.9 9.9 10.0  MG 2.5*  --  2.5* 2.4  --   PHOS 4.6  --   --   --  2.3*   GFR: Estimated Creatinine Clearance: 38.2 mL/min (A) (by C-G formula based on SCr of 1.42 mg/dL (H)). Liver Function Tests: Recent Labs  Lab 12/23/20 0510 12/24/20 0209 12/29/20 0521 12/29/20 0822  AST 16 13*  --  46*  ALT 15 14  --  27  ALKPHOS 67 59  --  65  BILITOT 0.8 0.9  --  1.2  PROT 7.0 6.5  --  6.2*  ALBUMIN 2.9* 2.8* 2.9* 2.9*   Recent Labs  Lab 12/29/20 0822  LIPASE 49   No results for input(s): AMMONIA in the last 168 hours. Coagulation Profile: Recent Labs  Lab 12/25/20 0810  INR 1.5*   Cardiac Enzymes: No results for input(s): CKTOTAL, CKMB, CKMBINDEX, TROPONINI in the last 168 hours. BNP (last 3 results) No results for input(s): PROBNP in the last 8760 hours. HbA1C: No results for input(s): HGBA1C in the last 72 hours. CBG: Recent Labs  Lab 12/27/20 2120 12/28/20 0752 12/28/20 1145 12/28/20 2015 12/29/20 0751  GLUCAP 253* 273* 310* 258* 153*   Lipid Profile: No  results for input(s): CHOL, HDL, LDLCALC, TRIG, CHOLHDL, LDLDIRECT in the last 72 hours. Thyroid Function Tests: No results for input(s): TSH, T4TOTAL, FREET4, T3FREE, THYROIDAB in the last 72 hours. Anemia Panel: No results for input(s): VITAMINB12, FOLATE, FERRITIN, TIBC, IRON, RETICCTPCT in the last 72 hours. Urine analysis:    Component Value Date/Time   COLORURINE YELLOW 12/24/2020 1700   APPEARANCEUR CLEAR 12/24/2020 1700   LABSPEC 1.015 12/24/2020 1700   PHURINE 5.0 12/24/2020 1700   GLUCOSEU 50 (A) 12/24/2020 1700   HGBUR NEGATIVE 12/24/2020 1700   BILIRUBINUR  NEGATIVE 12/24/2020 1700   KETONESUR NEGATIVE 12/24/2020 1700   PROTEINUR NEGATIVE 12/24/2020 1700   UROBILINOGEN 0.2 05/15/2013 0733   NITRITE NEGATIVE 12/24/2020 1700   LEUKOCYTESUR NEGATIVE 12/24/2020 1700   Sepsis Labs: @LABRCNTIP (procalcitonin:4,lacticidven:4)  ) Recent Results (from the past 240 hour(s))  MRSA PCR Screening     Status: None   Collection Time: 12/23/20  5:45 PM   Specimen: Nasal Mucosa; Nasopharyngeal  Result Value Ref Range Status   MRSA by PCR NEGATIVE NEGATIVE Final    Comment:        The GeneXpert MRSA Assay (FDA approved for NASAL specimens only), is one component of a comprehensive MRSA colonization surveillance program. It is not intended to diagnose MRSA infection nor to guide or monitor treatment for MRSA infections. Performed at [facility], [place] [name], [place], [place] [address]   SARS Coronavirus 2 by RT PCR (hospital order, performed in [facility] hospital lab) Nasopharyngeal Nasopharyngeal Swab     Status: None   Collection Time: 12/25/20  8:30 AM   Specimen: Nasopharyngeal Swab  Result Value Ref Range Status   SARS Coronavirus 2 NEGATIVE NEGATIVE Final    Comment: (NOTE) SARS-CoV-2 target nucleic acids are NOT DETECTED.  The SARS-CoV-2 RNA is generally detectable in upper and lower respiratory specimens during the acute phase of infection.  The lowest concentration of SARS-CoV-2 viral copies this assay can detect is 250 copies / mL. A negative result does not preclude SARS-CoV-2 infection and should not be used as the sole basis for treatment or other patient management decisions.  A negative result may occur with improper specimen collection / handling, submission of specimen other than nasopharyngeal swab, presence of viral mutation(s) within the areas targeted by this assay, and inadequate number of viral copies (<250 copies / mL). A negative result must be combined with clinical observations, patient history, and epidemiological information.  Fact Sheet for Patients:   StrictlyIdeas.no  Fact Sheet for Healthcare Providers: BankingDealers.co.za  This test is not yet approved or  cleared by the [place] FDA and has been authorized for detection and/or diagnosis of SARS-CoV-2 by FDA under an Emergency Use Authorization (EUA).  This EUA will remain in effect (meaning this test can be used) for the duration of the COVID-19 declaration under Section 564(b)(1) of the Act, 21 U.S.C. section 360bbb-3(b)(1), unless the authorization is terminated or revoked sooner.  Performed at [facility], [place] [address], [place], [place] [address]       Studies: CT ABDOMEN PELVIS WO CONTRAST  Result Date: 12/29/2020 CLINICAL DATA:  Increasing abdominal pain. Reason complication of diverticulitis. Renal transplant. EXAM: CT ABDOMEN AND PELVIS WITHOUT CONTRAST TECHNIQUE: Multidetector CT imaging of the abdomen and pelvis was performed following the standard protocol without IV contrast. COMPARISON:  CT of the abdomen and pelvis without contrast 12/16/2020. FINDINGS: Lower chest: Right greater than left pleural effusions are new. There is partial collapse of the right lower lobe. Mild dependent atelectasis is present bilaterally. The heart is enlarged. Coronary artery  calcifications are present. Hepatobiliary: Multiple hepatic cysts are again seen. There is some fluid under the diaphragm. No new lesions are present. Gallbladder and common bile duct are within normal limits. Pancreas: Unremarkable. No pancreatic ductal dilatation or surrounding inflammatory changes. Spleen: Stable splenomegaly.  No discrete lesion. Adrenals/Urinary Tract: Bilateral renal cystic disease is stable. No new lesions are present. Right lower quadrant renal transplant is unremarkable. Urinary bladder is within normal limits. Stomach/Bowel: The stomach and duodenum  are within normal limits. Small bowel is unremarkable. Terminal ileum is within normal limits. The ascending and transverse colon are unremarkable. Descending colon is within normal limits. Persistent inflammatory changes are evident. New intraperitoneal air is present over the upper abdomen. A fluid collection is present anterior to the urinary bladder, adjacent to the inflamed bowel. The collection measures 4.1 x 2.9 x 2.8 cm. Vascular/Lymphatic: Atherosclerotic calcifications are present in the abdominal aorta and branch vessels without aneurysm. Reproductive: Uterus and bilateral adnexa are unremarkable. Other: Fluid extends into left inguinal hernia. The paraumbilical hernia is again noted. There is gas within the hernia. Musculoskeletal: Advanced degenerative changes are present in the lumbar spine. Degenerative changes are present in the SI joints bilaterally. Left total hip arthroplasty noted. Remote left pubic symphysis fractures noted. IMPRESSION: 1. Pneumoperitoneum compatible with bowel perforation. 2. New fluid collection anterior to the urinary bladder, adjacent to the inflamed bowel. This is concerning for a developing abscess associated with the sigmoid diverticulitis. 3. Persistent inflammatory changes the sigmoid colon compatible with diverticulitis. 4. New bilateral pleural effusions, right greater than left. 5. Cardiomegaly  without failure. 6. Coronary artery disease. 7. Stable splenomegaly. 8. Aortic Atherosclerosis (ICD10-I70.0). Electronically Signed   By: San Morelle M.D.   On: 12/29/2020 09:53    Scheduled Meds: . [MAR Hold] allopurinol  100 mg Oral Daily  . [MAR Hold] amLODipine  10 mg Oral Daily  . [MAR Hold] arformoterol  15 mcg Nebulization BID   And  . [MAR Hold] umeclidinium bromide  1 puff Inhalation Daily   And  . [MAR Hold] budesonide (PULMICORT) nebulizer solution  0.5 mg Nebulization BID  . [MAR Hold] brimonidine  1 drop Both Eyes BID  . [MAR Hold] Chlorhexidine Gluconate Cloth  6 each Topical Daily  . [MAR Hold] cycloSPORINE  1 drop Both Eyes BID  . [MAR Hold] dorzolamide  1 drop Both Eyes Daily  . [MAR Hold] ferrous sulfate  325 mg Oral Q breakfast  . [MAR Hold] FLUoxetine  40 mg Oral Daily  . [MAR Hold] gabapentin  200 mg Oral BID  . [MAR Hold] guaiFENesin  600 mg Oral BID  . [MAR Hold] hydrALAZINE  100 mg Oral Q8H  . [MAR Hold] insulin aspart  0-5 Units Subcutaneous QHS  . [MAR Hold] insulin aspart  0-9 Units Subcutaneous TID WC  . [MAR Hold] insulin aspart  8 Units Subcutaneous TID WC  . [MAR Hold] insulin glargine  13 Units Subcutaneous BID  . [MAR Hold] isosorbide mononitrate  30 mg Oral Daily  . [MAR Hold] latanoprost  1 drop Both Eyes QHS  . [MAR Hold] mouth rinse  15 mL Mouth Rinse BID  . [MAR Hold] metoprolol tartrate  100 mg Oral BID  . [MAR Hold] mycophenolate  180 mg Oral BID  . naloxone      . [MAR Hold] pantoprazole  40 mg Oral BID  . [MAR Hold] polyethylene glycol  17 g Oral Daily  . [MAR Hold] predniSONE  7.5 mg Oral Q breakfast  . [MAR Hold] saccharomyces boulardii  250 mg Oral BID  . [MAR Hold] senna-docusate  2 tablet Oral BID  . [MAR Hold] tacrolimus  1 mg Oral BID  . [MAR Hold] valACYclovir  500 mg Oral BID  . [MAR Hold] cyanocobalamin  1,000 mcg Oral Daily    Continuous Infusions: . [MAR Hold] piperacillin-tazobactam (ZOSYN)  IV       LOS: 13  days     Kayleen Memos, MD [facility] Pager [phone]  If 7PM-7AM, please contact night-coverage [email or web address] Password [email or web address] 12/29/2020, 1:26 PM

## 2020-12-30 ENCOUNTER — Encounter (HOSPITAL_COMMUNITY): Payer: Self-pay | Admitting: Surgery

## 2020-12-30 DIAGNOSIS — A419 Sepsis, unspecified organism: Secondary | ICD-10-CM | POA: Diagnosis not present

## 2020-12-30 DIAGNOSIS — R6521 Severe sepsis with septic shock: Secondary | ICD-10-CM | POA: Diagnosis not present

## 2020-12-30 DIAGNOSIS — I4891 Unspecified atrial fibrillation: Secondary | ICD-10-CM | POA: Diagnosis not present

## 2020-12-30 DIAGNOSIS — K5792 Diverticulitis of intestine, part unspecified, without perforation or abscess without bleeding: Secondary | ICD-10-CM | POA: Diagnosis not present

## 2020-12-30 DIAGNOSIS — I5033 Acute on chronic diastolic (congestive) heart failure: Secondary | ICD-10-CM | POA: Diagnosis not present

## 2020-12-30 LAB — CBC WITH DIFFERENTIAL/PLATELET
Abs Immature Granulocytes: 0.13 10*3/uL — ABNORMAL HIGH (ref 0.00–0.07)
Basophils Absolute: 0 10*3/uL (ref 0.0–0.1)
Basophils Relative: 0 %
Eosinophils Absolute: 0 10*3/uL (ref 0.0–0.5)
Eosinophils Relative: 0 %
HCT: 23 % — ABNORMAL LOW (ref 36.0–46.0)
Hemoglobin: 7.2 g/dL — ABNORMAL LOW (ref 12.0–15.0)
Immature Granulocytes: 1 %
Lymphocytes Relative: 2 %
Lymphs Abs: 0.2 10*3/uL — ABNORMAL LOW (ref 0.7–4.0)
MCH: 32.3 pg (ref 26.0–34.0)
MCHC: 31.3 g/dL (ref 30.0–36.0)
MCV: 103.1 fL — ABNORMAL HIGH (ref 80.0–100.0)
Monocytes Absolute: 0.2 10*3/uL (ref 0.1–1.0)
Monocytes Relative: 2 %
Neutro Abs: 14 10*3/uL — ABNORMAL HIGH (ref 1.7–7.7)
Neutrophils Relative %: 95 %
Platelets: 119 10*3/uL — ABNORMAL LOW (ref 150–400)
RBC: 2.23 MIL/uL — ABNORMAL LOW (ref 3.87–5.11)
RDW: 19.3 % — ABNORMAL HIGH (ref 11.5–15.5)
WBC: 14.6 10*3/uL — ABNORMAL HIGH (ref 4.0–10.5)
nRBC: 0 % (ref 0.0–0.2)

## 2020-12-30 LAB — ALDOSTERONE + RENIN ACTIVITY W/ RATIO
ALDO / PRA Ratio: 3.2 (ref 0.0–30.0)
Aldosterone: 5.5 ng/dL (ref 0.0–30.0)
PRA LC/MS/MS: 1.735 ng/mL/hr (ref 0.167–5.380)

## 2020-12-30 LAB — GLUCOSE, CAPILLARY
Glucose-Capillary: 147 mg/dL — ABNORMAL HIGH (ref 70–99)
Glucose-Capillary: 159 mg/dL — ABNORMAL HIGH (ref 70–99)
Glucose-Capillary: 143 mg/dL — ABNORMAL HIGH (ref 70–99)
Glucose-Capillary: 138 mg/dL — ABNORMAL HIGH (ref 70–99)

## 2020-12-30 LAB — COMPREHENSIVE METABOLIC PANEL
ALT: 13 U/L (ref 0–44)
AST: 11 U/L — ABNORMAL LOW (ref 15–41)
Albumin: 2.5 g/dL — ABNORMAL LOW (ref 3.5–5.0)
Alkaline Phosphatase: 40 U/L (ref 38–126)
Anion gap: 9 (ref 5–15)
BUN: 82 mg/dL — ABNORMAL HIGH (ref 8–23)
CO2: 25 mmol/L (ref 22–32)
Calcium: 9.4 mg/dL (ref 8.9–10.3)
Chloride: 103 mmol/L (ref 98–111)
Creatinine, Ser: 1.27 mg/dL — ABNORMAL HIGH (ref 0.44–1.00)
GFR, Estimated: 48 mL/min — ABNORMAL LOW (ref >60)
Glucose, Bld: 130 mg/dL — ABNORMAL HIGH (ref 70–99)
Potassium: 4.8 mmol/L (ref 3.5–5.1)
Sodium: 137 mmol/L (ref 135–145)
Total Bilirubin: 2.3 mg/dL — ABNORMAL HIGH (ref 0.3–1.2)
Total Protein: 4.8 g/dL — ABNORMAL LOW (ref 6.5–8.1)

## 2020-12-30 LAB — HEMOGLOBIN AND HEMATOCRIT, BLOOD
HCT: 20.8 % — ABNORMAL LOW (ref 36.0–46.0)
HCT: 23 % — ABNORMAL LOW (ref 36.0–46.0)
Hemoglobin: 6.6 g/dL — CL (ref 12.0–15.0)
Hemoglobin: 7.3 g/dL — ABNORMAL LOW (ref 12.0–15.0)

## 2020-12-30 LAB — PREPARE RBC (CROSSMATCH)

## 2020-12-30 LAB — TRIGLYCERIDES: Triglycerides: 93 mg/dL (ref <150)

## 2020-12-30 MED ORDER — VALACYCLOVIR 50 MG/ML ORAL SUSPENSION
500.0000 mg | Freq: Two times a day (BID) | ORAL | Status: DC
  Administered 2020-12-31: 500 mg
  Filled 2020-12-30 (×2): qty 10

## 2020-12-30 MED ORDER — SODIUM CHLORIDE 0.9% IV SOLUTION: Freq: Once | INTRAVENOUS | Status: DC

## 2020-12-30 MED ORDER — INSULIN ASPART 100 UNIT/ML ~~LOC~~ SOLN
0.0000 [IU] | SUBCUTANEOUS | Status: DC
  Administered 2020-12-30 (×2): 2 [IU] via SUBCUTANEOUS
  Administered 2020-12-30: 3 [IU] via SUBCUTANEOUS
  Administered 2020-12-31 (×2): 2 [IU] via SUBCUTANEOUS
  Administered 2021-01-01: 5 [IU] via SUBCUTANEOUS
  Administered 2021-01-01: 3 [IU] via SUBCUTANEOUS
  Administered 2021-01-02: 5 [IU] via SUBCUTANEOUS
  Administered 2021-01-02: 2 [IU] via SUBCUTANEOUS
  Administered 2021-01-02: 5 [IU] via SUBCUTANEOUS
  Administered 2021-01-02 – 2021-01-03 (×4): 3 [IU] via SUBCUTANEOUS
  Administered 2021-01-03: 2 [IU] via SUBCUTANEOUS
  Administered 2021-01-03: 5 [IU] via SUBCUTANEOUS
  Administered 2021-01-04: 3 [IU] via SUBCUTANEOUS
  Administered 2021-01-04 (×3): 5 [IU] via SUBCUTANEOUS
  Administered 2021-01-05: 3 [IU] via SUBCUTANEOUS

## 2020-12-30 MED ORDER — TACROLIMUS 1 MG/ML ORAL SUSPENSION
1.0000 mg | Freq: Two times a day (BID) | ORAL | Status: DC
  Administered 2020-12-31: 1 mg
  Filled 2020-12-30 (×2): qty 1

## 2020-12-30 MED ORDER — MYCOPHENOLATE 200 MG/ML ORAL SUSPENSION
180.0000 mg | Freq: Two times a day (BID) | ORAL | Status: DC
  Administered 2020-12-31 (×2): 180 mg
  Filled 2020-12-30 (×8): qty 3.6

## 2020-12-30 MED ORDER — ACETAMINOPHEN 10 MG/ML IV SOLN
1000.0000 mg | Freq: Four times a day (QID) | INTRAVENOUS | Status: AC
  Administered 2020-12-30 – 2020-12-31 (×4): 1000 mg via INTRAVENOUS
  Filled 2020-12-30 (×4): qty 100

## 2020-12-30 MED ORDER — VITAMIN B-12 1000 MCG PO TABS
1000.0000 ug | ORAL_TABLET | Freq: Every day | ORAL | Status: DC
  Administered 2020-12-31: 1000 ug
  Filled 2020-12-30: qty 1

## 2020-12-30 NOTE — Progress Notes (Signed)
Patient ID: April [name], female   DOB: May 22, 1956, 65 y.o.   MRN: [id number] S: seen in ICU, pt went for exlap w/ sigmoid colectomy and end colostomy yesterday. UOP good yest and today, B/Cr 82/ 1.2 today, stable. CXR yest normal for this patient.   O:BP (!) 141/61   Pulse 78   Temp 98 F (36.7 C) (Axillary)   Resp (!) 21   Ht 5' 1"  (1.549 m)   Wt 77.2 kg   SpO2 100%   BMI 32.16 kg/m   Intake/Output Summary (Last 24 hours) at 12/30/2020 1658 Last data filed at 12/30/2020 1400 Gross per 24 hour  Intake 1188.8 ml  Output 1240 ml  Net -51.2 ml   Intake/Output: I/O last 3 completed shifts: In: 3284.9 [P.O.:540; I.V.:2194.9; IV Piggyback:550] Out: 2409 [Urine:1515; Blood:350]  Intake/Output this shift:  Total I/O In: 966.8 [I.V.:876.1; IV Piggyback:90.6] Out: 550 [Urine:350; Emesis/NG output:200] Weight change:  Gen: on vent, BP's wnl, fiO2 30% CVS:RRR Resp: cta Abd: bandages not removed Ext:1+ BLE edema  Recent Labs  Lab 12/24/20 0209 12/25/20 0231 12/26/20 0427 12/27/20 0636 12/28/20 0504 12/29/20 0521 12/29/20 0822 12/29/20 1347 12/29/20 1525 12/30/20 0345  NA 131* 128* 129* 133* 132* 136  --  136 137 137  K 4.7 4.8 5.1 5.3* 5.3* 4.7  --  4.1 4.4 4.8  CL 98 96* 98 99 100 101  --   --  104 103  CO2 21* 22 25 23 23 27   --   --  26 25  GLUCOSE 230* 287* 247* 281* 288* 156*  --   --  79 130*  BUN 89* 101* 102* 101* 105* 95*  --   --  83* 82*  CREATININE 2.52* 2.38* 2.34* 2.07* 1.53* 1.42*  --   --  1.19* 1.27*  ALBUMIN 2.8*  --   --   --   --  2.9* 2.9*  --  3.0* 2.5*  CALCIUM 9.8 9.7 9.8 9.9 9.9 10.0  --   --  9.6 9.4  PHOS  --  4.6  --   --   --  2.3*  --   --   --   --   AST 13*  --   --   --   --   --  46*  --  13* 11*  ALT 14  --   --   --   --   --  27  --  13 13   Liver Function Tests: Recent Labs  Lab 12/29/20 0822 12/29/20 1525 12/30/20 0345  AST 46* 13* 11*  ALT 27 13 13   ALKPHOS 65 43 40  BILITOT 1.2 1.8* 2.3*  PROT 6.2* 5.1* 4.8*  ALBUMIN  2.9* 3.0* 2.5*   Recent Labs  Lab 12/29/20 0822  LIPASE 49   No results for input(s): AMMONIA in the last 168 hours. CBC: Recent Labs  Lab 12/27/20 0426 12/28/20 0513 12/29/20 0521 12/29/20 1347 12/29/20 1525 12/30/20 0345 12/30/20 1218  WBC 5.4 16.0* 15.9*  --  15.2* 14.6*  --   NEUTROABS  --   --   --   --  14.5* 14.0*  --   HGB 9.0* 10.2* 9.7*   < > 8.4* 7.2* 6.6*  HCT 29.1* 32.7* 31.1*   < > 27.0* 23.0* 20.8*  MCV 102.1* 101.9* 102.0*  --  104.7* 103.1*  --   PLT 121* 224 177  --  133* 119*  --    < > = values  in this interval not displayed.   . sodium chloride   Intravenous Once  . brimonidine  1 drop Both Eyes BID  . chlorhexidine gluconate (MEDLINE KIT)  15 mL Mouth Rinse BID  . Chlorhexidine Gluconate Cloth  6 each Topical Daily  . cycloSPORINE  1 drop Both Eyes BID  . dorzolamide  1 drop Both Eyes Daily  . insulin aspart  0-15 Units Subcutaneous Q4H  . insulin glargine  13 Units Subcutaneous BID  . latanoprost  1 drop Both Eyes QHS  . mouth rinse  15 mL Mouth Rinse 10 times per day  . mycophenolate  180 mg Oral BID  . pantoprazole (PROTONIX) IV  40 mg Intravenous Daily  . tacrolimus  1 mg Oral BID  . valACYclovir  500 mg Oral BID  . cyanocobalamin  1,000 mcg Oral Daily    BMET    Component Value Date/Time   NA 137 12/30/2020 0345   K 4.8 12/30/2020 0345   CL 103 12/30/2020 0345   CO2 25 12/30/2020 0345   GLUCOSE 130 (H) 12/30/2020 0345   BUN 82 (H) 12/30/2020 0345   CREATININE 1.27 (H) 12/30/2020 0345   CREATININE 1.28 (H) 11/04/2020 0952   CALCIUM 9.4 12/30/2020 0345   CALCIUM 10.8 (H) 10/13/2011 1405   GFRNONAA 48 (L) 12/30/2020 0345   GFRNONAA 47 (L) 11/04/2020 0952   GFRAA 51 (L) 08/05/2020 1523   CBC    Component Value Date/Time   WBC 14.6 (H) 12/30/2020 0345   RBC 2.23 (L) 12/30/2020 0345   HGB 6.6 (LL) 12/30/2020 1218   HGB 9.0 (L) 11/04/2020 0952   HGB 10.6 (L) 12/29/2010 1522   HCT 20.8 (L) 12/30/2020 1218   HCT 31.2 (L)  12/29/2010 1522   PLT 119 (L) 12/30/2020 0345   PLT 241 11/04/2020 0952   PLT 205 12/29/2010 1522   MCV 103.1 (H) 12/30/2020 0345   MCV 94 12/29/2010 1522   MCH 32.3 12/30/2020 0345   MCHC 31.3 12/30/2020 0345   RDW 19.3 (H) 12/30/2020 0345   RDW 12.8 12/29/2010 1522   LYMPHSABS 0.2 (L) 12/30/2020 0345   LYMPHSABS 1.1 12/29/2010 1522   MONOABS 0.2 12/30/2020 0345   EOSABS 0.0 12/30/2020 0345   EOSABS 0.2 12/29/2010 1522   BASOSABS 0.0 12/30/2020 0345   BASOSABS 0.0 12/29/2010 1522      Assessment/Plan:  1. AKI/CKD stage 3a- multifactorial, in setting of intravascular volume depletion from poor po intake as well as N/V as well as hypotension from sepsis/hypoperfusion and 3rd spacing from acute on chronic diastolic CHF.Scr peaked at 2.71, now slowly improving. Creat 1.2 today, in normal range. Donah Driver prob d/t steroids. Will follow.  2. Acute sigmoid diverticulitis w/ perforation - now sp sigmoid colectomy and end colostomy on 2/06.  3. Adrenal insufficiency.Low am cortisol, noted renin/aldoinprocess 4. Atrial flutter with RVR- started on heparin gtt and s/p successful DCCV2/2/22. Cardiology following and pt now on metoprolol 5. Hypotension- resolved 6. Acute on chronic diastolic CHF - up 2kg for usual 75kg wt, good UOP. Holding off on IV lasix for now.  7. S/p kidney transplant- Continue prograf/myfortic. Check prograf level. Note on solumedrol taper, her home prednisone dose is 7.5 mg daily and should not be tapered lower than that 8. Hyponatremia- improved 9. COPD- per PCCM 10. Deconditioning- very weak   Kelly Splinter, MD 12/30/2020, 5:05 PM

## 2020-12-30 NOTE — Progress Notes (Signed)
Nutrition Follow-up  DOCUMENTATION CODES:   Obesity unspecified  INTERVENTION:  - will monitor for plan concerning nutrition support.   NUTRITION DIAGNOSIS:   Inadequate oral intake related to inability to eat as evidenced by NPO status.  GOAL:   Provide needs based on ASPEN/SCCM guidelines  MONITOR:   Vent status,Labs,Weight trends,Skin,Other (Comment) (nutrition support initiation)  REASON FOR ASSESSMENT:   Ventilator  ASSESSMENT:   65 year old female with medical history of severe persistent asthma, chronic hypoxemic respiratory failure on 2L [place], ESRD s/p deceased donor renal transplant in 2013 on immune suppression followed by [facility], CHF, MGUS, and HTN.  She was recently seen in the ED due to uncomplicated sigmoid diverticulitis. She returned to the ED with pain and fever, admitted and started on abx, and developed afib with RVR. She has undergone TEE and cardioversion this admission. The morning of 2/6 patient developed acute abdominal pain and CT showed concern for free air in the abdomen and perforated diverticulitis; underwent Hartman's procedure. She developed acute hypoxia 2/2 septic shock related to perforation and peritonitis.  Briefly discussed in rounds this AM.   Patient previously on a Renal diet and was eating 50-100% from 1/28-2/5. She was changed to NPO yesterday at 0950.   She is POD #1 ex lap, sigmoid colectomy, and end colostomy. She remains intubated and OGT in place.   Her daughter was at bedside and provided information. Patient had a good appetite throughout hospitalization, but would not eat items she did not care for. She typically eats smaller portions at meals and snacks throughout the day when she is at home, so eating 3 large meals/day during hospitalization has been difficult for her.   She does not have any chewing difficulties and was previously evaluated for possible swallowing difficulty but it was determined that this was likely more of a  breathing issue than a swallowing issue.   Patient is able to feed herself without difficulty and she is R handed. She does have chronic BLE weakness d/t previous falls with BLE fractures and hip fx in the past. She has home PT and OT and is mainly able to get around and transfer on her own at home using a walker and wheelchair that she peddles with her feet.   Noted BLE edema; daughter reports that she has chronic edema to LLE but that current edema is worse than her normal.  Weight has been fairly stable since admission on 1/26, but could be falsely so d/t edema.    Patient is currently intubated on ventilator support MV: 8 L/min Temp (24hrs), Avg:97.9 F (36.6 C), Min:96.9 F (36.1 C), Max:98.7 F (37.1 C) Propofol: none BP: 155/61 and MAP:89  Labs reviewed; CBGs: 147 mg/dl, BUN: 82 mg/dl, creatinine: 1.27 mg/dl, GFR: 48 ml/min.  Medications reviewed; sliding scale novolog, 13 units lantus BID, 40 mg IV protonix/day, 1000 mcg cyanocobalamin/day per OGT.  IVF; LR @ 50 ml/hr.  Drips; fentanyl @ 50 mcg/hr, levo @ 2 mcg/min.     NUTRITION - FOCUSED PHYSICAL EXAM:  completed; no muscle or fat depletions; mild pitting edema to BUE, deep pitting edema to BLE.   Diet Order:   Diet Order            Diet NPO time specified  Diet effective now                 EDUCATION NEEDS:   Not appropriate for education at this time  Skin:  Skin Assessment: Skin Integrity Issues: Skin Integrity Issues::  Stage II,Other (Comment),Incisions Stage II: sacrum Incisions: abdomen (2/6) Other: MASD bilateral buttocks  Last BM:  2/6  Height:   Ht Readings from Last 1 Encounters:  12/29/20 5\' 1"  (1.549 m)    Weight:   Wt Readings from Last 1 Encounters:  12/29/20 77.2 kg    Estimated Nutritional Needs:  Kcal:  1428 kcal (PSU equation) Protein:  112-125 grams Fluid:  >/= 1.5 L/day      Jarome Matin, MS, RD, LDN, CNSC Inpatient Clinical Dietitian RD pager # available in  AMION  After hours/weekend pager # available in [facility]

## 2020-12-30 NOTE — TOC Progression Note (Signed)
Transition of Care [facility]) - Progression Note    Patient Details  Name: April [name] MRN: [id number] Date of Birth: 30-Nov-1955  Transition of Care [facility]) CM/SW Contact  Leeroy Cha, RN Phone Number: 12/30/2020, 9:42 AM  Clinical Narrative:    1 Day Post-Op  Subjective: On vent, responsive.  Had bleeding overnight from midline wound.  Appears to have stopped currently.  Off pressors at this time.  ROS: See above, otherwise other systems negative  Objective: Vital signs in last 24 hours: Temp:  [96.9 F (36.1 C)-98.7 F (37.1 C)] 97.8 F (36.6 C) (02/07 0748) Pulse Rate:  [56-77] 60 (02/07 0615) Resp:  [14-24] 20 (02/07 0615) BP: (126-159)/(61-65) 141/61 (02/06 1200) SpO2:  [86 %-100 %] 100 % (02/07 0615) Arterial Line BP: (92-154)/(41-65) 106/46 (02/07 0615) FiO2 (%):  [50 %-100 %] 50 % (02/07 0008) Last BM Date: 12/29/20 PLAN: to return to home with self care when stable  Expected Discharge Plan: Home/Self Care Barriers to Discharge: Continued Medical Work up  Expected Discharge Plan and Services Expected Discharge Plan: Home/Self Care   Discharge Planning Services: CM Consult   Living arrangements for the past 2 months: Single Family Home                                       Social Determinants of Health (SDOH) Interventions    Readmission Risk Interventions Readmission Risk Prevention Plan 05/15/2020 04/19/2020 02/09/2020  Transportation Screening Complete Complete Complete  PCP or Specialist Appt within 3-5 Days - - -  Not Complete comments - - -  HRI or [place] or Home Care Consult comments - - -  Social Work Scientific laboratory technician for [place] Planning/Counseling - - -  SW consult not completed comments - - -  Palliative Care Screening - - -  Medication Review ([place]) Referral to Pharmacy Complete Referral to Pharmacy  PCP or Specialist appointment within 3-5 days of discharge Complete Complete Complete  HRI or  Home Care Consult Complete Complete Complete  SW Recovery Care/Counseling Consult Complete Complete -  Palliative Care Screening Not Applicable Not Applicable Complete  Skilled Nursing Facility Complete Complete Patient Refused  Some recent data might be hidden

## 2020-12-30 NOTE — Progress Notes (Signed)
1 Day Post-Op  Subjective: On vent, responsive.  Had bleeding overnight from midline wound.  Appears to have stopped currently.  Off pressors at this time.  ROS: See above, otherwise other systems negative  Objective: Vital signs in last 24 hours: Temp:  [96.9 F (36.1 C)-98.7 F (37.1 C)] 97.8 F (36.6 C) (02/07 0748) Pulse Rate:  [56-77] 60 (02/07 0615) Resp:  [14-24] 20 (02/07 0615) BP: (126-159)/(61-65) 141/61 (02/06 1200) SpO2:  [86 %-100 %] 100 % (02/07 0615) Arterial Line BP: (92-154)/(41-65) 106/46 (02/07 0615) FiO2 (%):  [50 %-100 %] 50 % (02/07 0008) Last BM Date: 12/29/20  Intake/Output from previous day: 02/06 0701 - 02/07 0700 In: 2744.9 [I.V.:2194.9; IV Piggyback:550] Out: 1572 [Urine:1065; Blood:350] Intake/Output this shift: No intake/output data recorded.  PE: Abd: soft, tender as expected, midline wound is open.  Evidence of skin edge bleeding from superior aspect of wound.  Appears to have stopped.  Wound is clean and repacked.  NGT flushed with output following flushing, bilious in nature.  No documentation for NGT I/Os so unclear how much she had had out since surgery.  About 200cc in cannister currently.  Stoma is present with viable pink stoma, old dark bloody drainage in bag, but no flatus or other output suggesting bowel function  Lab Results:  Recent Labs    12/29/20 1525 12/30/20 0345  WBC 15.2* 14.6*  HGB 8.4* 7.2*  HCT 27.0* 23.0*  PLT 133* 119*   BMET Recent Labs    12/29/20 1525 12/30/20 0345  NA 137 137  K 4.4 4.8  CL 104 103  CO2 26 25  GLUCOSE 79 130*  BUN 83* 82*  CREATININE 1.19* 1.27*  CALCIUM 9.6 9.4   PT/INR Recent Labs    12/29/20 1630  LABPROT 20.1*  INR 1.8*   CMP     Component Value Date/Time   NA 137 12/30/2020 0345   K 4.8 12/30/2020 0345   CL 103 12/30/2020 0345   CO2 25 12/30/2020 0345   GLUCOSE 130 (H) 12/30/2020 0345   BUN 82 (H) 12/30/2020 0345   CREATININE 1.27 (H) 12/30/2020 0345    CREATININE 1.28 (H) 11/04/2020 0952   CALCIUM 9.4 12/30/2020 0345   CALCIUM 10.8 (H) 10/13/2011 1405   PROT 4.8 (L) 12/30/2020 0345   ALBUMIN 2.5 (L) 12/30/2020 0345   AST 11 (L) 12/30/2020 0345   AST 13 (L) 11/04/2020 0952   ALT 13 12/30/2020 0345   ALT 12 11/04/2020 0952   ALKPHOS 40 12/30/2020 0345   BILITOT 2.3 (H) 12/30/2020 0345   BILITOT 0.6 11/04/2020 0952   GFRNONAA 48 (L) 12/30/2020 0345   GFRNONAA 47 (L) 11/04/2020 0952   GFRAA 51 (L) 08/05/2020 1523   Lipase     Component Value Date/Time   LIPASE 49 12/29/2020 0822       Studies/Results: CT ABDOMEN PELVIS WO CONTRAST  Result Date: 12/29/2020 CLINICAL DATA:  Increasing abdominal pain. Reason complication of diverticulitis. Renal transplant. EXAM: CT ABDOMEN AND PELVIS WITHOUT CONTRAST TECHNIQUE: Multidetector CT imaging of the abdomen and pelvis was performed following the standard protocol without IV contrast. COMPARISON:  CT of the abdomen and pelvis without contrast 12/16/2020. FINDINGS: Lower chest: Right greater than left pleural effusions are new. There is partial collapse of the right lower lobe. Mild dependent atelectasis is present bilaterally. The heart is enlarged. Coronary artery calcifications are present. Hepatobiliary: Multiple hepatic cysts are again seen. There is some fluid under the diaphragm. No new lesions are  present. Gallbladder and common bile duct are within normal limits. Pancreas: Unremarkable. No pancreatic ductal dilatation or surrounding inflammatory changes. Spleen: Stable splenomegaly.  No discrete lesion. Adrenals/Urinary Tract: Bilateral renal cystic disease is stable. No new lesions are present. Right lower quadrant renal transplant is unremarkable. Urinary bladder is within normal limits. Stomach/Bowel: The stomach and duodenum are within normal limits. Small bowel is unremarkable. Terminal ileum is within normal limits. The ascending and transverse colon are unremarkable. Descending colon is  within normal limits. Persistent inflammatory changes are evident. New intraperitoneal air is present over the upper abdomen. A fluid collection is present anterior to the urinary bladder, adjacent to the inflamed bowel. The collection measures 4.1 x 2.9 x 2.8 cm. Vascular/Lymphatic: Atherosclerotic calcifications are present in the abdominal aorta and branch vessels without aneurysm. Reproductive: Uterus and bilateral adnexa are unremarkable. Other: Fluid extends into left inguinal hernia. The paraumbilical hernia is again noted. There is gas within the hernia. Musculoskeletal: Advanced degenerative changes are present in the lumbar spine. Degenerative changes are present in the SI joints bilaterally. Left total hip arthroplasty noted. Remote left pubic symphysis fractures noted. IMPRESSION: 1. Pneumoperitoneum compatible with bowel perforation. 2. New fluid collection anterior to the urinary bladder, adjacent to the inflamed bowel. This is concerning for a developing abscess associated with the sigmoid diverticulitis. 3. Persistent inflammatory changes the sigmoid colon compatible with diverticulitis. 4. New bilateral pleural effusions, right greater than left. 5. Cardiomegaly without failure. 6. Coronary artery disease. 7. Stable splenomegaly. 8. Aortic Atherosclerosis (ICD10-I70.0). Electronically Signed   By: San Morelle M.D.   On: 12/29/2020 09:53   DG CHEST PORT 1 VIEW  Result Date: 12/29/2020 CLINICAL DATA:  LEFT central line placement. EXAM: PORTABLE CHEST 1 VIEW COMPARISON:  December 25, 2020. FINDINGS: Endotracheal tube placed, tip between clavicular heads approximately 3 cm above the carina. LEFT-sided IJ central venous catheter terminates at the proximal portion of the SVC. Gastric tube courses through in off the field of the radiograph. Leads project over the patient's chest anteriorly. Cardiomediastinal contours stable enlarged. Graded opacity in the RIGHT chest persists with central  pulmonary vascular congestion. On limited assessment there is no acute skeletal process. IMPRESSION: 1. Interval placement of LEFT IJ central venous catheter with tip at the proximal portion of the SVC. No pneumothorax. 2. Persistent RIGHT-sided fusion extending into the major fissure. 3. Cardiomegaly with potential small LEFT effusion as well. 4. Basilar atelectasis. Electronically Signed   By: Zetta Bills M.D.   On: 12/29/2020 15:32    Anti-infectives: Anti-infectives (From admission, onward)   Start     Dose/Rate Route Frequency Ordered Stop   12/29/20 1045  piperacillin-tazobactam (ZOSYN) IVPB 3.375 g        3.375 g 12.5 mL/hr over 240 Minutes Intravenous Every 8 hours 12/29/20 0956     12/24/20 0800  ciprofloxacin (CIPRO) tablet 500 mg  Status:  Discontinued        500 mg Oral Daily with breakfast 12/23/20 1721 12/23/20 1737   12/23/20 2200  metroNIDAZOLE (FLAGYL) tablet 500 mg  Status:  Discontinued        500 mg Oral Every 8 hours 12/23/20 1721 12/23/20 1737   12/23/20 2200  amoxicillin-clavulanate (AUGMENTIN) 500-125 MG per tablet 500 mg  Status:  Discontinued        1 tablet Oral 2 times daily 12/23/20 1739 12/29/20 0949   12/18/20 1100  Ampicillin-Sulbactam (UNASYN) 3 g in sodium chloride 0.9 % 100 mL IVPB  Status:  Discontinued  3 g 200 mL/hr over 30 Minutes Intravenous Every 12 hours 12/18/20 0729 12/23/20 1721   12/17/20 1400  Ampicillin-Sulbactam (UNASYN) 3 g in sodium chloride 0.9 % 100 mL IVPB  Status:  Discontinued        3 g 200 mL/hr over 30 Minutes Intravenous Every 6 hours 12/17/20 1004 12/18/20 0729   12/16/20 2200  ciprofloxacin (CIPRO) IVPB 400 mg  Status:  Discontinued        400 mg 200 mL/hr over 60 Minutes Intravenous Every 12 hours 12/16/20 1451 12/16/20 1734   12/16/20 2200  piperacillin-tazobactam (ZOSYN) IVPB 3.375 g  Status:  Discontinued        3.375 g 12.5 mL/hr over 240 Minutes Intravenous Every 8 hours 12/16/20 1734 12/17/20 0949   12/16/20 1800   metroNIDAZOLE (FLAGYL) IVPB 500 mg  Status:  Discontinued        500 mg 100 mL/hr over 60 Minutes Intravenous Every 8 hours 12/16/20 1451 12/16/20 1734   12/16/20 1515  valACYclovir (VALTREX) tablet 500 mg        500 mg Oral 2 times daily 12/16/20 1505     12/16/20 0915  ciprofloxacin (CIPRO) IVPB 400 mg        400 mg 200 mL/hr over 60 Minutes Intravenous  Once 12/16/20 0912 12/16/20 1104   12/16/20 0915  metroNIDAZOLE (FLAGYL) IVPB 500 mg        500 mg 100 mL/hr over 60 Minutes Intravenous  Once 12/16/20 0912 12/16/20 1104       Assessment/Plan Polycystic disease with renal transplant 2013-Prograf/prednisone. Renalfollowing Elevated Cr CHF/uncertain etiology -Cards following Chronic COPD/history of tobacco use- On 2L [place] at home Pleuritic chest pain and SOB- VQ 1/25:Low probability pulmonary embolus. Age indeterminate, non occlusive deep vein thrombosis involving the right popliteal vein Hx SVT Atrial flutter with RVR - on eliquis, which is currently on hold.  Likely reason for elevated INR of 1.8 yesterday and some oozing Hyponatremia - Per medicine -  POD 1, s/p Hartman's for Perforated sigmoid diverticulitis by Dr. Ninfa Linden on 2/6 - Dx 1/22 and failed outpatient tx. CT 2/50 w/ mild uncomplicated sigmoid diverticulitis. No abscess.surgery signed off 1/31 as patient's symptoms resolved. -called back 2/6 due to new CT showing perforated tics.  S/p OR last pm for this  -now on vent -cont NGT and await bowel function -NS WD dressing changes to midline wound BID -WOC consult for stoma care -cont zosyn -hgb dropped to 7.2.  Transfuse per CCM.  Given some general oozing post op and drop in hgb, would recommend holding heparin until tomorrow pending no further bleeding issues.   FEN: NPO/NGT/IVFs IB:[id number] 1/24;Zosyn1/24-1/25; Unasyn 1/25; zosyn 2/6 --> DVT:on hold due to surgery/oozing   LOS: 14 days    Henreitta Cea , [facility]  Surgery 12/30/2020, 7:59 AM Please see Amion for pager number during day hours 7:00am-4:30pm or 7:00am -11:30am on weekends

## 2020-12-30 NOTE — Progress Notes (Addendum)
NAME:  April [name], MRN:  [id number], DOB:  [birth year], LOS: 49 ADMISSION DATE:  12/16/2020, CONSULTATION DATE:  12/29/2020 REFERRING MD:  Dr. Nevada Crane, CHIEF COMPLAINT:  Perf bowel, vent management    Brief History:  65 year old female past medical history of severe persistent asthma, chronic hypoxemic respiratory failure on 2 L nasal cannula, ESRD, status post deceased donor renal transplant in 2013 on immune suppression followed by [facility].  She has chronic diastolic heart failure, MGUS, hypertension.  Recently seen in the ER for uncomplicated sigmoid diverticulitis returned to the hospital with pain and fever.  Admitted to the hospital started on antibiotics developed atrial fibrillation with RVR.  Had a successful TEE with [facility] cardioversion.  On the morning of 12/29/2020 patient developed acute abdominal pain had CT imaging that was concerning for free air within the belly and a perforated diverticulitis s/p Hartman's.  PCCM consulted when she remains on the vent, pressors post op.  Past Medical History:   Past Medical History:  Diagnosis Date  . Arthritis   . Asthma   . CHF (congestive heart failure) ([place])   . COPD (chronic obstructive pulmonary disease) ([place])   . Depression   . Essential hypertension 02/08/2020  . GERD (gastroesophageal reflux disease)   . Hyperlipidemia   . Hyperparathyroidism   . Polycystic kidney   . PONV (postoperative nausea and vomiting)      Significant Hospital Events:  1/24 Admit 2/2 TEE guided cardioversion 2/6 Hartman's procedure for perf diverticulitis  Consults:  General surgery Cardiology Nephrology  Procedures:  12/29/2020: Exploratory laparotomy, sigmoid colectomy and end colostomy, central venous line, arterial line  Significant Diagnostic Tests:    Micro Data:  COVID-19 negative  Antimicrobials:   Zosyn 12/24-12/25, 2/6 >>  Interim History / Subjective:   Remains on low-dose norepinephrine at 1 mcg, sedated on the  ventilator  Objective   Blood pressure (!) 141/61, pulse 80, temperature 97.8 F (36.6 C), temperature source Axillary, resp. rate 13, height 5\' 1"  (1.549 m), weight 77.2 kg, SpO2 99 %.    Vent Mode: CPAP FiO2 (%):  [40 %-100 %] 40 % Set Rate:  [16 bmp-20 bmp] 20 bmp Vt Set:  [380 mL] 380 mL PEEP:  [5 cmH20] 5 cmH20 Pressure Support:  [12 cmH20] 12 cmH20 Plateau Pressure:  [14 cmH20-18 cmH20] 15 cmH20   Intake/Output Summary (Last 24 hours) at 12/30/2020 0937 Last data filed at 12/30/2020 0900 Gross per 24 hour  Intake 3664.18 ml  Output 1765 ml  Net 1899.18 ml   Filed Weights   12/27/20 0515 12/28/20 0500 12/29/20 0500  Weight: 78.4 kg 77 kg 77.2 kg    Examination: Gen:      No acute distress, chronically ill-appearing HEENT:  EOMI, sclera anicteric Neck:     No masses; no thyromegaly, ETT Lungs:    Clear to auscultation bilaterally; normal respiratory effort CV:         Regular rate and rhythm; no murmurs Abd:      + bowel sounds; soft, non-tender; no palpable masses, no distension Ext:    Significant bruising, ecchymosis, hematoma over the left forearm. Skin:      Warm and dry; no rash Neuro: Sedated, unresponsive  Labs/imaging personally reviewed.  Significant for BUN/creatinine 82/1.27, WBC 14.6, hemoglobin 7.2 No new imaging  Resolved Hospital Problem list     Assessment & Plan:  Acute hypoxic respiratory failure secondary to septic shock Due to perforated diverticulitis, peritonitis  Plan: Continue mechanical ventilation  Wean PEEP/FiO2 Wean pressors Continue Zosyn Continue stress dose steroids  History of renal transplant. Immunosuppressed state Plan: Appreciate management by nephrology. On mycophenolate and tacrolimus Currently getting stress dose steroids.  Transition to home prednisone when hemodynamically stable.  Diabetes type 2 with hyperglycemia Steroid-induced hyperglycemia. Plan:  Holding Lantus due to hypoglycemia Continue SSI  Atrial  flutter with RVR status post TEE and DCCV on 12/25/2020 Plan: Anticoagulation was held for surgery today. Resume heparin when okay by surgery  AKI Plan: Follow urine output and creatinine Nephrology on board.   Best practice (evaluated daily)  Diet: NPO, will advance per surgery recommendations Pain/Anxiety/Delirium protocol (if indicated): PAD guidelines VAP protocol (if indicated): Yes DVT prophylaxis: Was on Eliquis, holding anticoagulation postop GI prophylaxis: PPI Glucose control: CBGs with SSI Mobility: Bedrest Disposition: ICU  Goals of Care:  Last date of multidisciplinary goals of care discussion: Family and staff present:  Summary of discussion:  Follow up goals of care discussion due:  Code Status: Full  Labs    The patient is critically ill with multiple organ system failure and requires high complexity decision making for assessment and support, frequent evaluation and titration of therapies, advanced monitoring, review of radiographic studies and interpretation of complex data.   Critical Care Time devoted to patient care services, exclusive of separately billable procedures, described in this note is 45 minutes.   Marshell Garfinkel MD [facility] See Amion for pager  If no response to pager , please call [phone] until 7pm After 7:00 pm call Elink  [phone] 12/30/2020, 9:37 AM

## 2020-12-30 NOTE — Consult Note (Signed)
[place] Nurse ostomy consult note Stoma type/location: LLQ colostomy with Hartmann's procedure Stomal assessment/size: 1 1/2" edematous and rubrous Peristomal assessment: pouch intact with no return of bowel function. Not assessed today. She remains intubated, but nods in response to my questions.  Treatment options for stomal/peristomal skin: She has sensitive skin.  We will apply sensicare pouches and recommend this going forward.  She has a raised hematoma to her left anterior forearm measuring 7 cm x 5.5 cm x +3 cm raised blood filled blister from previous venipuncture.  Output no ostomy output at this time.  Ostomy pouching: 2pc. 2 3/4" pouch will add barrier ring.  Education provided: Informed patient that we will begin teaching once she is extubated and stronger.  She nods in agreement.  Enrolled patient in [place] program: /No Will Follow.  Domenic Moras MSN, RN, FNP-BC CWON Wound, Ostomy, Continence Nurse Pager [phone]

## 2020-12-30 NOTE — Telephone Encounter (Signed)
Left voice message for patient to follow up.

## 2020-12-30 NOTE — Progress Notes (Signed)
Progress Note  Patient Name: April [name] Date of Encounter: 12/30/2020  [facility] Cardiologist: Sanda Klein, MD   Subjective   Underwent emergent surgery for perforated sigmoid diverticulitis yesterday.  Having oozing and dropping hemoglobin to 7.2 today, anticoagulation currently on hold.  On low dose levophed.  Intubated.  Alert and follows commands.  Inpatient Medications    Scheduled Meds: . brimonidine  1 drop Both Eyes BID  . chlorhexidine gluconate (MEDLINE KIT)  15 mL Mouth Rinse BID  . Chlorhexidine Gluconate Cloth  6 each Topical Daily  . cycloSPORINE  1 drop Both Eyes BID  . dorzolamide  1 drop Both Eyes Daily  . insulin aspart  0-15 Units Subcutaneous Q4H  . insulin glargine  13 Units Subcutaneous BID  . latanoprost  1 drop Both Eyes QHS  . mouth rinse  15 mL Mouth Rinse 10 times per day  . mycophenolate  180 mg Oral BID  . pantoprazole (PROTONIX) IV  40 mg Intravenous Daily  . tacrolimus  1 mg Oral BID  . valACYclovir  500 mg Oral BID  . cyanocobalamin  1,000 mcg Oral Daily   Continuous Infusions: . acetaminophen Stopped (12/30/20 1110)  . fentaNYL infusion INTRAVENOUS 25 mcg/hr (12/30/20 0900)  . lactated ringers 50 mL/hr at 12/30/20 1212  . norepinephrine (LEVOPHED) Adult infusion 1 mcg/min (12/30/20 0900)  . piperacillin-tazobactam (ZOSYN)  IV Stopped (12/30/20 0948)  . propofol (DIPRIVAN) infusion Stopped (12/29/20 1542)   PRN Meds: fentaNYL, midazolam, midazolam, naLOXone (NARCAN)  injection, [DISCONTINUED] ondansetron **OR** ondansetron (ZOFRAN) IV   Vital Signs    Vitals:   12/30/20 0821 12/30/20 0830 12/30/20 0900 12/30/20 1024  BP:      Pulse: 80 68 80 69  Resp: _0 Temp:      TempSrc:      SpO2: 100% 94% 99% 100%  Weight:      Height:        Intake/Output Summary (Last 24 hours) at 12/30/2020 1219 Last data filed at 12/30/2020 0900 Gross per 24 hour  Intake 3664.18 ml  Output 1765 ml  Net 1899.18 ml   Last 3  Weights 12/29/2020 12/28/2020 12/27/2020  Weight (lbs) 170 lb 3.1 oz 169 lb 12.1 oz 172 lb 13.5 oz  Weight (kg) 77.2 kg 77 kg 78.4 kg      Telemetry    Sinus rhythm at 70 - Personally Reviewed  ECG    No new tracings - Personally Reviewed  Physical Exam   VS:  BP (!) 141/61   Pulse 69   Temp 97.8 F (36.6 C) (Axillary)   Resp 20   Ht 5' 1" (1.549 m)   Wt 77.2 kg   SpO2 100%   BMI 32.16 kg/m  , BMI Body mass index is 32.16 kg/m.  General appearance: intubated, alert follows commands Lungs: mechanical breath sounds Heart: regular rate and rhythm, S1, S2 normal, no murmur,  Abdomen: appropriately tender Extremities: ecchymosis Skin:  No rashes or lesions Neurologic: Mental status: alert, follows commands Psych: cannot assess  Labs    High Sensitivity Troponin:   Recent Labs  Lab 12/18/20 1042 12/18/20 1357 12/22/20 1643  TROPONINIHS _1 Chemistry Recent Labs  Lab 12/29/20 0521 12/29/20 0822 12/29/20 1347 12/29/20 1525 12/30/20 0345  NA 136  --  136 137 137  K 4.7  --  4.1 4.4 4.8  CL 101  --   --  104 103  CO2  27  --   --  26 25  GLUCOSE 156*  --   --  79 130*  BUN 95*  --   --  83* 82*  CREATININE 1.42*  --   --  1.19* 1.27*  CALCIUM 10.0  --   --  9.6 9.4  PROT  --  6.2*  --  5.1* 4.8*  ALBUMIN 2.9* 2.9*  --  3.0* 2.5*  AST  --  46*  --  13* 11*  ALT  --  27  --  13 13  ALKPHOS  --  65  --  43 40  BILITOT  --  1.2  --  1.8* 2.3*  GFRNONAA 42*  --   --  51* 48*  ANIONGAP 8  --   --  7 9     Hematology Recent Labs  Lab 12/29/20 0521 12/29/20 1347 12/29/20 1525 12/30/20 0345  WBC 15.9*  --  15.2* 14.6*  RBC 3.05*  --  2.58* 2.23*  HGB 9.7* 7.8* 8.4* 7.2*  HCT 31.1* 23.0* 27.0* 23.0*  MCV 102.0*  --  104.7* 103.1*  MCH 31.8  --  32.6 32.3  MCHC 31.2  --  31.1 31.3  RDW 18.8*  --  19.3* 19.3*  PLT 177  --  133* 119*    BNP Recent Labs  Lab 12/24/20 1303  BNP 1,624.9*     DDimer No results for input(s): DDIMER in the last  168 hours.   Radiology    CT ABDOMEN PELVIS WO CONTRAST  Result Date: 12/29/2020 CLINICAL DATA:  Increasing abdominal pain. Reason complication of diverticulitis. Renal transplant. EXAM: CT ABDOMEN AND PELVIS WITHOUT CONTRAST TECHNIQUE: Multidetector CT imaging of the abdomen and pelvis was performed following the standard protocol without IV contrast. COMPARISON:  CT of the abdomen and pelvis without contrast 12/16/2020. FINDINGS: Lower chest: Right greater than left pleural effusions are new. There is partial collapse of the right lower lobe. Mild dependent atelectasis is present bilaterally. The heart is enlarged. Coronary artery calcifications are present. Hepatobiliary: Multiple hepatic cysts are again seen. There is some fluid under the diaphragm. No new lesions are present. Gallbladder and common bile duct are within normal limits. Pancreas: Unremarkable. No pancreatic ductal dilatation or surrounding inflammatory changes. Spleen: Stable splenomegaly.  No discrete lesion. Adrenals/Urinary Tract: Bilateral renal cystic disease is stable. No new lesions are present. Right lower quadrant renal transplant is unremarkable. Urinary bladder is within normal limits. Stomach/Bowel: The stomach and duodenum are within normal limits. Small bowel is unremarkable. Terminal ileum is within normal limits. The ascending and transverse colon are unremarkable. Descending colon is within normal limits. Persistent inflammatory changes are evident. New intraperitoneal air is present over the upper abdomen. A fluid collection is present anterior to the urinary bladder, adjacent to the inflamed bowel. The collection measures 4.1 x 2.9 x 2.8 cm. Vascular/Lymphatic: Atherosclerotic calcifications are present in the abdominal aorta and branch vessels without aneurysm. Reproductive: Uterus and bilateral adnexa are unremarkable. Other: Fluid extends into left inguinal hernia. The paraumbilical hernia is again noted. There is gas  within the hernia. Musculoskeletal: Advanced degenerative changes are present in the lumbar spine. Degenerative changes are present in the SI joints bilaterally. Left total hip arthroplasty noted. Remote left pubic symphysis fractures noted. IMPRESSION: 1. Pneumoperitoneum compatible with bowel perforation. 2. New fluid collection anterior to the urinary bladder, adjacent to the inflamed bowel. This is concerning for a developing abscess associated with the sigmoid diverticulitis. 3. Persistent inflammatory changes  the sigmoid colon compatible with diverticulitis. 4. New bilateral pleural effusions, right greater than left. 5. Cardiomegaly without failure. 6. Coronary artery disease. 7. Stable splenomegaly. 8. Aortic Atherosclerosis (ICD10-I70.0). Electronically Signed   By: San Morelle M.D.   On: 12/29/2020 09:53   DG CHEST PORT 1 VIEW  Result Date: 12/29/2020 CLINICAL DATA:  LEFT central line placement. EXAM: PORTABLE CHEST 1 VIEW COMPARISON:  December 25, 2020. FINDINGS: Endotracheal tube placed, tip between clavicular heads approximately 3 cm above the carina. LEFT-sided IJ central venous catheter terminates at the proximal portion of the SVC. Gastric tube courses through in off the field of the radiograph. Leads project over the patient's chest anteriorly. Cardiomediastinal contours stable enlarged. Graded opacity in the RIGHT chest persists with central pulmonary vascular congestion. On limited assessment there is no acute skeletal process. IMPRESSION: 1. Interval placement of LEFT IJ central venous catheter with tip at the proximal portion of the SVC. No pneumothorax. 2. Persistent RIGHT-sided fusion extending into the major fissure. 3. Cardiomegaly with potential small LEFT effusion as well. 4. Basilar atelectasis. Electronically Signed   By: Zetta Bills M.D.   On: 12/29/2020 15:32    Cardiac Studies   ECHO 12/17/20  IMPRESSIONS   1. Left ventricular ejection fraction, by estimation,  is 70 to 75%. The  left ventricle has hyperdynamic function. The left ventricle has no  regional wall motion abnormalities. Left ventricular diastolic parameters  are consistent with Grade II diastolic  dysfunction (pseudonormalization). Elevated left atrial pressure.  2. Right ventricular systolic function is normal. The right ventricular  size is normal. There is normal pulmonary artery systolic pressure.  3. The mitral valve is normal in structure. No evidence of mitral valve  regurgitation. No evidence of mitral stenosis.  4. The aortic valve is tricuspid. Aortic valve regurgitation is not  visualized. No aortic stenosis is present.  5. The inferior vena cava is normal in size with greater than 50%  respiratory variability, suggesting right atrial pressure of 3 mmHg.   Patient Profile     65 y.o. female with a hx of HTN, GERD, COPD, MGUS, HFpEF,SVT 01/2020,ESRD with renal transplant 2013 on immunosuppressants followed at [facility] and admitted 1.24.22 with diverticulitis after failing outpt therapy.  Initially followed by cardiology for hypotension, however now with new onset atrial flutter with RVR.   Assessment & Plan    # Atrial flutter with RVR:  New onset this admission.  Underwent successful TEE/DCCV on 12/25/2020.  She is maintaining sinus rhythm.  Restart heparin gtt once OK with surgery  # Septic shock 2/2 perforated sigmoid diverticulitis: POD1 s/p Hartman's.  Currently on low dose levophed, wean as tolerated.  # Acute on chronic diastolic CHF:  # AoCKD in patient with hx of renal transplant: Nephrology following, managing diuretics   For questions or updates, please contact [place] Please consult [email or web address] for contact info under   Donato Heinz, MD  12/30/2020, 12:18 PM

## 2020-12-31 ENCOUNTER — Inpatient Hospital Stay (HOSPITAL_COMMUNITY): Payer: Medicare Other

## 2020-12-31 DIAGNOSIS — I4819 Other persistent atrial fibrillation: Secondary | ICD-10-CM

## 2020-12-31 DIAGNOSIS — I5031 Acute diastolic (congestive) heart failure: Secondary | ICD-10-CM | POA: Diagnosis not present

## 2020-12-31 LAB — GLUCOSE, CAPILLARY
Glucose-Capillary: 114 mg/dL — ABNORMAL HIGH (ref 70–99)
Glucose-Capillary: 137 mg/dL — ABNORMAL HIGH (ref 70–99)
Glucose-Capillary: 149 mg/dL — ABNORMAL HIGH (ref 70–99)
Glucose-Capillary: 314 mg/dL — ABNORMAL HIGH (ref 70–99)
Glucose-Capillary: 325 mg/dL — ABNORMAL HIGH (ref 70–99)
Glucose-Capillary: 88 mg/dL (ref 70–99)
Glucose-Capillary: 94 mg/dL (ref 70–99)
Glucose-Capillary: 97 mg/dL (ref 70–99)
Glucose-Capillary: 98 mg/dL (ref 70–99)

## 2020-12-31 LAB — CBC WITH DIFFERENTIAL/PLATELET
Abs Immature Granulocytes: 0.07 10*3/uL (ref 0.00–0.07)
Basophils Absolute: 0 10*3/uL (ref 0.0–0.1)
Basophils Relative: 0 %
Eosinophils Absolute: 0 10*3/uL (ref 0.0–0.5)
Eosinophils Relative: 0 %
HCT: 24 % — ABNORMAL LOW (ref 36.0–46.0)
Hemoglobin: 7.6 g/dL — ABNORMAL LOW (ref 12.0–15.0)
Immature Granulocytes: 1 %
Lymphocytes Relative: 5 %
Lymphs Abs: 0.5 10*3/uL — ABNORMAL LOW (ref 0.7–4.0)
MCH: 32.1 pg (ref 26.0–34.0)
MCHC: 31.7 g/dL (ref 30.0–36.0)
MCV: 101.3 fL — ABNORMAL HIGH (ref 80.0–100.0)
Monocytes Absolute: 0.4 10*3/uL (ref 0.1–1.0)
Monocytes Relative: 3 %
Neutro Abs: 10.6 10*3/uL — ABNORMAL HIGH (ref 1.7–7.7)
Neutrophils Relative %: 91 %
Platelets: 104 10*3/uL — ABNORMAL LOW (ref 150–400)
RBC: 2.37 MIL/uL — ABNORMAL LOW (ref 3.87–5.11)
RDW: 20.5 % — ABNORMAL HIGH (ref 11.5–15.5)
WBC: 11.6 10*3/uL — ABNORMAL HIGH (ref 4.0–10.5)
nRBC: 0 % (ref 0.0–0.2)

## 2020-12-31 LAB — MAGNESIUM
Magnesium: 2.2 mg/dL (ref 1.7–2.4)
Magnesium: 2 mg/dL (ref 1.7–2.4)

## 2020-12-31 LAB — COMPREHENSIVE METABOLIC PANEL
ALT: 11 U/L (ref 0–44)
AST: 11 U/L — ABNORMAL LOW (ref 15–41)
Albumin: 2.4 g/dL — ABNORMAL LOW (ref 3.5–5.0)
Alkaline Phosphatase: 45 U/L (ref 38–126)
Anion gap: 10 (ref 5–15)
BUN: 76 mg/dL — ABNORMAL HIGH (ref 8–23)
CO2: 25 mmol/L (ref 22–32)
Calcium: 9.6 mg/dL (ref 8.9–10.3)
Chloride: 104 mmol/L (ref 98–111)
Creatinine, Ser: 1.41 mg/dL — ABNORMAL HIGH (ref 0.44–1.00)
GFR, Estimated: 42 mL/min — ABNORMAL LOW (ref >60)
Glucose, Bld: 96 mg/dL (ref 70–99)
Potassium: 4.2 mmol/L (ref 3.5–5.1)
Sodium: 139 mmol/L (ref 135–145)
Total Bilirubin: 2.6 mg/dL — ABNORMAL HIGH (ref 0.3–1.2)
Total Protein: 4.8 g/dL — ABNORMAL LOW (ref 6.5–8.1)

## 2020-12-31 LAB — SURGICAL PATHOLOGY

## 2020-12-31 LAB — PHOSPHORUS: Phosphorus: 3.5 mg/dL (ref 2.5–4.6)

## 2020-12-31 MED ORDER — VALACYCLOVIR HCL 500 MG PO TABS
500.0000 mg | ORAL_TABLET | Freq: Two times a day (BID) | ORAL | Status: DC
  Administered 2020-12-31 – 2021-01-14 (×29): 500 mg via ORAL
  Filled 2020-12-31 (×30): qty 1

## 2020-12-31 MED ORDER — FENTANYL CITRATE (PF) 100 MCG/2ML IJ SOLN
25.0000 ug | INTRAMUSCULAR | Status: DC | PRN
  Administered 2021-01-01 – 2021-01-10 (×5): 25 ug via INTRAVENOUS
  Filled 2020-12-31 (×5): qty 2

## 2020-12-31 MED ORDER — VITAMIN B-12 1000 MCG PO TABS
1000.0000 ug | ORAL_TABLET | Freq: Every day | ORAL | Status: DC
  Administered 2021-01-01 – 2021-01-14 (×14): 1000 ug via ORAL
  Filled 2020-12-31 (×14): qty 1

## 2020-12-31 MED ORDER — TACROLIMUS 1 MG PO CAPS
1.0000 mg | ORAL_CAPSULE | Freq: Two times a day (BID) | ORAL | Status: DC
  Administered 2020-12-31 – 2021-01-14 (×29): 1 mg via ORAL
  Filled 2020-12-31 (×29): qty 1

## 2020-12-31 MED ORDER — ORAL CARE MOUTH RINSE
15.0000 mL | Freq: Two times a day (BID) | OROMUCOSAL | Status: DC
  Administered 2020-12-31 – 2021-01-14 (×27): 15 mL via OROMUCOSAL

## 2020-12-31 MED ORDER — METHYLPREDNISOLONE SODIUM SUCC 40 MG IJ SOLR
10.0000 mg | Freq: Every day | INTRAMUSCULAR | Status: DC
  Administered 2020-12-31 – 2021-01-04 (×5): 10 mg via INTRAVENOUS
  Filled 2020-12-31 (×5): qty 1

## 2020-12-31 MED ORDER — LIP MEDEX EX OINT
1.0000 "application " | TOPICAL_OINTMENT | CUTANEOUS | Status: DC | PRN
  Filled 2020-12-31: qty 7

## 2020-12-31 NOTE — Progress Notes (Addendum)
Brief Progress Note  Pt looks good on CPAP 5/5. Sats 100% on 30% RR is 22 at present Volumes are 350, she was able to pull 700 cc  Upon request. She is awake and following all commands.  Will extubate Will add CPAP at bedtime as she has had some witnessed apnea with sleep. Will need a formal OSA work up as OP.

## 2020-12-31 NOTE — Evaluation (Addendum)
SLP Cancellation Note  Patient Details Name: April [name] MRN: [id number] DOB: [birth year]   Cancelled treatment:       Reason Eval/Treat Not Completed: Other (comment) (Order for swallow eval received, thank you.  Note pt extubated today at 57.  Intubated 2/6  For surgery and extubated 2/8 pm.  Per surgery note, await bowel function.  SLP will follow up in am 01/01/2021 for readiness for eval/po if surgery and CCM agree. Thank you.)  Kathleen Lime, MS [facility] SLP Acute Rehab Services Office [phone] Pager [place], [place] 12/31/2020, 7:42 PM

## 2020-12-31 NOTE — Consult Note (Signed)
[place] Nurse ostomy follow up Stoma type/location: LLQ colostomy  First pouch change with daughter at bedside Stomal assessment/size: 1 1/2" creasing at 3 o'clock Peristomal assessment: senstiive skin with faint redness beneath pouch, intact  Will benefit from [place] plus barrier.  Will order with secure start. Bleeding noted from surgical wound, congealed  Surgery team aware.  BID NS moist dressing changes.  Treatment options for stomal/peristomal skin: barrier ring Output blood tinged liquid in pouch only.  Ostomy pouching: 2pc. 2 3/4" pouch with barrier ring Education provided: Pouch change performed with daughter observing.  Explained twice weekly pouch changes.  POuch change process and rationale for barrier ring.  Patient and daughter observe process.  Enrolled patient in [place] Start Discharge program: No Will follow   Domenic Moras MSN, RN, FNP-BC CWON Wound, Ostomy, Continence Nurse Pager [phone]

## 2020-12-31 NOTE — Progress Notes (Signed)
Progress Note  Patient Name: April [name] Date of Encounter: 12/31/2020  [facility] Cardiologist: Sanda Klein, MD   Subjective   Off pressors.  Remains intubated.  Alert and following commands.  Inpatient Medications    Scheduled Meds: . sodium chloride   Intravenous Once  . brimonidine  1 drop Both Eyes BID  . chlorhexidine gluconate (MEDLINE KIT)  15 mL Mouth Rinse BID  . Chlorhexidine Gluconate Cloth  6 each Topical Daily  . cycloSPORINE  1 drop Both Eyes BID  . dorzolamide  1 drop Both Eyes Daily  . insulin aspart  0-15 Units Subcutaneous Q4H  . latanoprost  1 drop Both Eyes QHS  . mouth rinse  15 mL Mouth Rinse 10 times per day  . methylPREDNISolone (SOLU-MEDROL) injection  10 mg Intravenous Daily  . mycophenolate  180 mg Per Tube BID  . pantoprazole (PROTONIX) IV  40 mg Intravenous Daily  . tacrolimus  1 mg Per Tube BID  . valACYclovir  500 mg Per Tube BID  . cyanocobalamin  1,000 mcg Per Tube Daily   Continuous Infusions: . fentaNYL infusion INTRAVENOUS Stopped (12/31/20 0825)  . lactated ringers 35 mL/hr at 12/30/20 1743  . norepinephrine (LEVOPHED) Adult infusion 0 mcg/min (12/31/20 0750)  . piperacillin-tazobactam (ZOSYN)  IV Stopped (12/31/20 1059)  . propofol (DIPRIVAN) infusion Stopped (12/29/20 1542)   PRN Meds: fentaNYL, midazolam, naLOXone (NARCAN)  injection, [DISCONTINUED] ondansetron **OR** ondansetron (ZOFRAN) IV   Vital Signs    Vitals:   12/31/20 0820 12/31/20 0845 12/31/20 1000 12/31/20 1126  BP:      Pulse: 80 83 83 80  Resp: 15 20 10 16   Temp:      TempSrc:      SpO2: 100% 100% 100% 100%  Weight:      Height:        Intake/Output Summary (Last 24 hours) at 12/31/2020 1238 Last data filed at 12/31/2020 1000 Gross per 24 hour  Intake 1151.15 ml  Output 295 ml  Net 856.15 ml   Last 3 Weights 12/29/2020 12/28/2020 12/27/2020  Weight (lbs) 170 lb 3.1 oz 169 lb 12.1 oz 172 lb 13.5 oz  Weight (kg) 77.2 kg 77 kg 78.4 kg       Telemetry    Sinus rhythm- Personally Reviewed  ECG    No new tracings - Personally Reviewed  Physical Exam   VS:  BP (!) 150/54   Pulse 80   Temp 98 F (36.7 C) (Axillary)   Resp 16   Ht 5' 1"  (1.549 m)   Wt 77.2 kg   SpO2 100%   BMI 32.16 kg/m  , BMI Body mass index is 32.16 kg/m.  General appearance: intubated, alert follows commands Lungs: CTAB Heart: regular rate and rhythm, 2/6 systolic murmur Abdomen: appropriately tender Extremities: ecchymosis Skin:  No rashes or lesions Neurologic: Mental status: alert, follows commands Psych: cannot assess  Labs    High Sensitivity Troponin:   Recent Labs  Lab 12/18/20 1042 12/18/20 1357 12/22/20 1643  TROPONINIHS 11 14 5       Chemistry Recent Labs  Lab 12/29/20 1525 12/30/20 0345 12/31/20 0530  NA 137 137 139  K 4.4 4.8 4.2  CL 104 103 104  CO2 26 25 25   GLUCOSE 79 130* 96  BUN 83* 82* 76*  CREATININE 1.19* 1.27* 1.41*  CALCIUM 9.6 9.4 9.6  PROT 5.1* 4.8* 4.8*  ALBUMIN 3.0* 2.5* 2.4*  AST 13* 11* 11*  ALT 13 13 11  ALKPHOS 43 40 45  BILITOT 1.8* 2.3* 2.6*  GFRNONAA 51* 48* 42*  ANIONGAP 7 9 10      Hematology Recent Labs  Lab 12/29/20 1525 12/30/20 0345 12/30/20 1218 12/30/20 1937 12/31/20 0530  WBC 15.2* 14.6*  --   --  11.6*  RBC 2.58* 2.23*  --   --  2.37*  HGB 8.4* 7.2* 6.6* 7.3* 7.6*  HCT 27.0* 23.0* 20.8* 23.0* 24.0*  MCV 104.7* 103.1*  --   --  101.3*  MCH 32.6 32.3  --   --  32.1  MCHC 31.1 31.3  --   --  31.7  RDW 19.3* 19.3*  --   --  20.5*  PLT 133* 119*  --   --  104*    BNP Recent Labs  Lab 12/24/20 1303  BNP 1,624.9*     DDimer No results for input(s): DDIMER in the last 168 hours.   Radiology    DG Chest Port 1 View  Result Date: 12/31/2020 CLINICAL DATA:  Respiratory failure. EXAM: PORTABLE CHEST 1 VIEW COMPARISON:  12/29/2020 FINDINGS: u 0451 hours. Endotracheal tube tip is 4.4 cm above the base of the carina. The NG tube passes into the stomach although  the distal tip position is not included on the film. Left IJ central line tip overlies the innominate vein confluence, stable. The cardio pericardial silhouette is enlarged. There is pulmonary vascular congestion without overt pulmonary edema. Bibasilar atelectasis/infiltrate again noted with probable small bilateral pleural effusions. IMPRESSION: Slight progression of bibasilar atelectasis/infiltrate with small layering pleural effusions visible on the current study. Cardiomegaly with vascular congestion. Electronically Signed   By: Misty Stanley M.D.   On: 12/31/2020 07:40   DG CHEST PORT 1 VIEW  Result Date: 12/29/2020 CLINICAL DATA:  LEFT central line placement. EXAM: PORTABLE CHEST 1 VIEW COMPARISON:  December 25, 2020. FINDINGS: Endotracheal tube placed, tip between clavicular heads approximately 3 cm above the carina. LEFT-sided IJ central venous catheter terminates at the proximal portion of the SVC. Gastric tube courses through in off the field of the radiograph. Leads project over the patient's chest anteriorly. Cardiomediastinal contours stable enlarged. Graded opacity in the RIGHT chest persists with central pulmonary vascular congestion. On limited assessment there is no acute skeletal process. IMPRESSION: 1. Interval placement of LEFT IJ central venous catheter with tip at the proximal portion of the SVC. No pneumothorax. 2. Persistent RIGHT-sided fusion extending into the major fissure. 3. Cardiomegaly with potential small LEFT effusion as well. 4. Basilar atelectasis. Electronically Signed   By: Zetta Bills M.D.   On: 12/29/2020 15:32    Cardiac Studies   ECHO 12/17/20  IMPRESSIONS   1. Left ventricular ejection fraction, by estimation, is 70 to 75%. The  left ventricle has hyperdynamic function. The left ventricle has no  regional wall motion abnormalities. Left ventricular diastolic parameters  are consistent with Grade II diastolic  dysfunction (pseudonormalization). Elevated  left atrial pressure.  2. Right ventricular systolic function is normal. The right ventricular  size is normal. There is normal pulmonary artery systolic pressure.  3. The mitral valve is normal in structure. No evidence of mitral valve  regurgitation. No evidence of mitral stenosis.  4. The aortic valve is tricuspid. Aortic valve regurgitation is not  visualized. No aortic stenosis is present.  5. The inferior vena cava is normal in size with greater than 50%  respiratory variability, suggesting right atrial pressure of 3 mmHg.   Patient Profile     65 y.o.  female with a hx of HTN, GERD, COPD, MGUS, HFpEF,SVT 01/2020,ESRD with renal transplant 2013 on immunosuppressants followed at [facility] and admitted 1.24.22 with diverticulitis after failing outpt therapy.  Initially followed by cardiology for hypotension, however now with new onset atrial flutter with RVR.   Assessment & Plan    # Atrial flutter with RVR:  New onset this admission.  Underwent successful TEE/DCCV on 12/25/2020.  She is maintaining sinus rhythm.  Restart heparin gtt once OK with surgery  # Septic shock 2/2 perforated sigmoid diverticulitis: POD2 s/p Hartman's.  Weaned off pressors, BP stable.  # Acute on chronic diastolic CHF:  # AoCKD in patient with hx of renal transplant: Nephrology following, managing diuretics   For questions or updates, please contact [place] Please consult [email or web address] for contact info under   Donato Heinz, MD  12/31/2020, 12:38 PM

## 2020-12-31 NOTE — Progress Notes (Signed)
Pt is not ready for cpap at this time.  This Probation officer will return to assist with nocturnal cpap around midnight per pt request.  RN aware.

## 2020-12-31 NOTE — Procedures (Signed)
Extubation Procedure Note  Patient Details:   Name: April [name] DOB: Nov 17, 1956 MRN: [id number]   Airway Documentation:    Vent end date: 12/31/20 Vent end time: 1355   Evaluation  O2 sats: stable throughout Complications: No apparent complications Patient did tolerate procedure well. Bilateral Breath Sounds: Diminished   Yes  Gonzella Lex 12/31/2020, 2:17 PM

## 2020-12-31 NOTE — Progress Notes (Signed)
[facility] Progress Note  Subjective: seen in ICU, on vent , awake and writing on message boards.   Vitals:   12/31/20 1000 12/31/20 1126 12/31/20 1200 12/31/20 1416  BP:      Pulse: 83 80    Resp: 10 16    Temp:   98.7 F (37.1 C)   TempSrc:   Axillary   SpO2: 100% 100%  100%  Weight:      Height:        Exam: Gen: on vent, awake and alert, BP's wnl, fiO2 30% CVS:RRR Resp: cta Abd: bandages not removed, L sided ostomy bag Ext:1-2+ bilat UE and LE edema Neuro: alert, on the vent, responsive    Assessment/Plan: 1. AKI on CKD stage 3a- b/l creatinine 1.1- 1.5. AKI due to volume depletion, N/V, hypotension from sepsis/hypoperfusion, 3rd spacing and a/c diast CHF.Scr peaked at 2.71, now improving. Creat low 1's. Good UOP. Some edema/ ^vol on exam. Will follow.  2. 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  3. Adrenal insufficiency.Low am cortisol, noted renin/aldoinprocess 4. Atrial flutter with RVR- started on heparin gtt and s/p successful DCCV2/2/22. Cardiology following and pt now on metoprolol 5. A/C diast CHF: no resp issues , vol up somewhat  6. S/p kidney transplant- Continue prograf/myfortic. Prograf level in progress.  Note on solumedrol taper, her home prednisone dose is 7.5 mg daily and should not be tapered lower than that 7. Hyponatremia- improved 8. COPD- per PCCM 9. Deconditioning- still quite weak   Kelly Splinter, MD 12/31/2020, 3:09 PM   Recent Labs  Lab 12/29/20 0521 12/29/20 1347 12/30/20 0345 12/30/20 1218 12/30/20 1937 12/31/20 0530  K 4.7   < > 4.8  --   --  4.2  BUN 95*   < > 82*  --   --  76*  CREATININE 1.42*   < > 1.27*  --   --  1.41*  CALCIUM 10.0   < > 9.4  --   --  9.6  PHOS 2.3*  --   --   --   --  3.5  HGB 9.7*   < > 7.2*   < > 7.3* 7.6*   < > = values in this interval not displayed.   Inpatient medications: . sodium chloride   Intravenous Once  .  brimonidine  1 drop Both Eyes BID  . chlorhexidine gluconate (MEDLINE KIT)  15 mL Mouth Rinse BID  . Chlorhexidine Gluconate Cloth  6 each Topical Daily  . cycloSPORINE  1 drop Both Eyes BID  . dorzolamide  1 drop Both Eyes Daily  . insulin aspart  0-15 Units Subcutaneous Q4H  . latanoprost  1 drop Both Eyes QHS  . mouth rinse  15 mL Mouth Rinse 10 times per day  . methylPREDNISolone (SOLU-MEDROL) injection  10 mg Intravenous Daily  . mycophenolate  180 mg Per Tube BID  . pantoprazole (PROTONIX) IV  40 mg Intravenous Daily  . tacrolimus  1 mg Per Tube BID  . valACYclovir  500 mg Per Tube BID  . cyanocobalamin  1,000 mcg Per Tube Daily   . lactated ringers 35 mL/hr at 12/30/20 1743  . norepinephrine (LEVOPHED) Adult infusion 0 mcg/min (12/31/20 0750)  . piperacillin-tazobactam (ZOSYN)  IV Stopped (12/31/20 1059)   fentaNYL (SUBLIMAZE) injection, lip balm, naLOXone (NARCAN)  injection, [DISCONTINUED] ondansetron **OR** ondansetron (ZOFRAN) IV

## 2020-12-31 NOTE — Progress Notes (Signed)
2 Days Post-Op  Subjective: On vent, responsive, following commands.  Admits to some abdominal pain this morning.   Chart says on 76mcg of levo, but does not appear to be running while i'm in the room.  On pressure support and doing well, being switched to wean.  ROS: unable, on vent  Objective: Vital signs in last 24 hours: Temp:  [97.5 F (36.4 C)-98.3 F (36.8 C)] 98 F (36.7 C) (02/08 0800) Pulse Rate:  [60-88] 83 (02/08 0845) Resp:  [15-32] 20 (02/08 0845) BP: (128-150)/(48-54) 150/54 (02/08 0351) SpO2:  [87 %-100 %] 100 % (02/08 0845) Arterial Line BP: (87-163)/(35-60) 135/51 (02/08 0845) FiO2 (%):  [30 %-40 %] 40 % (02/08 0820) Last BM Date: 12/29/20  Intake/Output from previous day: 02/07 0701 - 02/08 0700 In: 2045.3 [I.V.:1435.5; Blood:372.5; IV Piggyback:237.3] Out: 770 [Urine:570; Emesis/NG output:200] Intake/Output this shift: Total I/O In: 23.1 [I.V.:23.1] Out: -   PE: Abd: soft, tender as expected, midline wound is open and clean.  Some clot noted from some further oozing.  Stoma is pink and viable with no flatus or output noted yet.  NGT in place with some bilious output, but only 200cc documented yesterday  Lab Results:  Recent Labs    12/30/20 0345 12/30/20 1218 12/30/20 1937 12/31/20 0530  WBC 14.6*  --   --  11.6*  HGB 7.2*   < > 7.3* 7.6*  HCT 23.0*   < > 23.0* 24.0*  PLT 119*  --   --  104*   < > = values in this interval not displayed.   BMET Recent Labs    12/30/20 0345 12/31/20 0530  NA 137 139  K 4.8 4.2  CL 103 104  CO2 25 25  GLUCOSE 130* 96  BUN 82* 76*  CREATININE 1.27* 1.41*  CALCIUM 9.4 9.6   PT/INR Recent Labs    12/29/20 1630  LABPROT 20.1*  INR 1.8*   CMP     Component Value Date/Time   NA 139 12/31/2020 0530   K 4.2 12/31/2020 0530   CL 104 12/31/2020 0530   CO2 25 12/31/2020 0530   GLUCOSE 96 12/31/2020 0530   BUN 76 (H) 12/31/2020 0530   CREATININE 1.41 (H) 12/31/2020 0530   CREATININE 1.28 (H)  11/04/2020 0952   CALCIUM 9.6 12/31/2020 0530   CALCIUM 10.8 (H) 10/13/2011 1405   PROT 4.8 (L) 12/31/2020 0530   ALBUMIN 2.4 (L) 12/31/2020 0530   AST 11 (L) 12/31/2020 0530   AST 13 (L) 11/04/2020 0952   ALT 11 12/31/2020 0530   ALT 12 11/04/2020 0952   ALKPHOS 45 12/31/2020 0530   BILITOT 2.6 (H) 12/31/2020 0530   BILITOT 0.6 11/04/2020 0952   GFRNONAA 42 (L) 12/31/2020 0530   GFRNONAA 47 (L) 11/04/2020 0952   GFRAA 51 (L) 08/05/2020 1523   Lipase     Component Value Date/Time   LIPASE 49 12/29/2020 0822       Studies/Results: CT ABDOMEN PELVIS WO CONTRAST  Result Date: 12/29/2020 CLINICAL DATA:  Increasing abdominal pain. Reason complication of diverticulitis. Renal transplant. EXAM: CT ABDOMEN AND PELVIS WITHOUT CONTRAST TECHNIQUE: Multidetector CT imaging of the abdomen and pelvis was performed following the standard protocol without IV contrast. COMPARISON:  CT of the abdomen and pelvis without contrast 12/16/2020. FINDINGS: Lower chest: Right greater than left pleural effusions are new. There is partial collapse of the right lower lobe. Mild dependent atelectasis is present bilaterally. The heart is enlarged. Coronary artery calcifications  are present. Hepatobiliary: Multiple hepatic cysts are again seen. There is some fluid under the diaphragm. No new lesions are present. Gallbladder and common bile duct are within normal limits. Pancreas: Unremarkable. No pancreatic ductal dilatation or surrounding inflammatory changes. Spleen: Stable splenomegaly.  No discrete lesion. Adrenals/Urinary Tract: Bilateral renal cystic disease is stable. No new lesions are present. Right lower quadrant renal transplant is unremarkable. Urinary bladder is within normal limits. Stomach/Bowel: The stomach and duodenum are within normal limits. Small bowel is unremarkable. Terminal ileum is within normal limits. The ascending and transverse colon are unremarkable. Descending colon is within normal  limits. Persistent inflammatory changes are evident. New intraperitoneal air is present over the upper abdomen. A fluid collection is present anterior to the urinary bladder, adjacent to the inflamed bowel. The collection measures 4.1 x 2.9 x 2.8 cm. Vascular/Lymphatic: Atherosclerotic calcifications are present in the abdominal aorta and branch vessels without aneurysm. Reproductive: Uterus and bilateral adnexa are unremarkable. Other: Fluid extends into left inguinal hernia. The paraumbilical hernia is again noted. There is gas within the hernia. Musculoskeletal: Advanced degenerative changes are present in the lumbar spine. Degenerative changes are present in the SI joints bilaterally. Left total hip arthroplasty noted. Remote left pubic symphysis fractures noted. IMPRESSION: 1. Pneumoperitoneum compatible with bowel perforation. 2. New fluid collection anterior to the urinary bladder, adjacent to the inflamed bowel. This is concerning for a developing abscess associated with the sigmoid diverticulitis. 3. Persistent inflammatory changes the sigmoid colon compatible with diverticulitis. 4. New bilateral pleural effusions, right greater than left. 5. Cardiomegaly without failure. 6. Coronary artery disease. 7. Stable splenomegaly. 8. Aortic Atherosclerosis (ICD10-I70.0). Electronically Signed   By: San Morelle M.D.   On: 12/29/2020 09:53   DG Chest Port 1 View  Result Date: 12/31/2020 CLINICAL DATA:  Respiratory failure. EXAM: PORTABLE CHEST 1 VIEW COMPARISON:  12/29/2020 FINDINGS: u 0451 hours. Endotracheal tube tip is 4.4 cm above the base of the carina. The NG tube passes into the stomach although the distal tip position is not included on the film. Left IJ central line tip overlies the innominate vein confluence, stable. The cardio pericardial silhouette is enlarged. There is pulmonary vascular congestion without overt pulmonary edema. Bibasilar atelectasis/infiltrate again noted with probable  small bilateral pleural effusions. IMPRESSION: Slight progression of bibasilar atelectasis/infiltrate with small layering pleural effusions visible on the current study. Cardiomegaly with vascular congestion. Electronically Signed   By: Misty Stanley M.D.   On: 12/31/2020 07:40   DG CHEST PORT 1 VIEW  Result Date: 12/29/2020 CLINICAL DATA:  LEFT central line placement. EXAM: PORTABLE CHEST 1 VIEW COMPARISON:  December 25, 2020. FINDINGS: Endotracheal tube placed, tip between clavicular heads approximately 3 cm above the carina. LEFT-sided IJ central venous catheter terminates at the proximal portion of the SVC. Gastric tube courses through in off the field of the radiograph. Leads project over the patient's chest anteriorly. Cardiomediastinal contours stable enlarged. Graded opacity in the RIGHT chest persists with central pulmonary vascular congestion. On limited assessment there is no acute skeletal process. IMPRESSION: 1. Interval placement of LEFT IJ central venous catheter with tip at the proximal portion of the SVC. No pneumothorax. 2. Persistent RIGHT-sided fusion extending into the major fissure. 3. Cardiomegaly with potential small LEFT effusion as well. 4. Basilar atelectasis. Electronically Signed   By: Zetta Bills M.D.   On: 12/29/2020 15:32    Anti-infectives: Anti-infectives (From admission, onward)   Start     Dose/Rate Route Frequency Ordered Stop  12/31/20 1000  valACYclovir (VALTREX) compounded oral suspension 500 mg        500 mg Per Tube 2 times daily 12/30/20 2051     12/29/20 1045  piperacillin-tazobactam (ZOSYN) IVPB 3.375 g        3.375 g 12.5 mL/hr over 240 Minutes Intravenous Every 8 hours 12/29/20 0956     12/24/20 0800  ciprofloxacin (CIPRO) tablet 500 mg  Status:  Discontinued        500 mg Oral Daily with breakfast 12/23/20 1721 12/23/20 1737   12/23/20 2200  metroNIDAZOLE (FLAGYL) tablet 500 mg  Status:  Discontinued        500 mg Oral Every 8 hours 12/23/20 1721  12/23/20 1737   12/23/20 2200  amoxicillin-clavulanate (AUGMENTIN) 500-125 MG per tablet 500 mg  Status:  Discontinued        1 tablet Oral 2 times daily 12/23/20 1739 12/29/20 0949   12/18/20 1100  Ampicillin-Sulbactam (UNASYN) 3 g in sodium chloride 0.9 % 100 mL IVPB  Status:  Discontinued        3 g 200 mL/hr over 30 Minutes Intravenous Every 12 hours 12/18/20 0729 12/23/20 1721   12/17/20 1400  Ampicillin-Sulbactam (UNASYN) 3 g in sodium chloride 0.9 % 100 mL IVPB  Status:  Discontinued        3 g 200 mL/hr over 30 Minutes Intravenous Every 6 hours 12/17/20 1004 12/18/20 0729   12/16/20 2200  ciprofloxacin (CIPRO) IVPB 400 mg  Status:  Discontinued        400 mg 200 mL/hr over 60 Minutes Intravenous Every 12 hours 12/16/20 1451 12/16/20 1734   12/16/20 2200  piperacillin-tazobactam (ZOSYN) IVPB 3.375 g  Status:  Discontinued        3.375 g 12.5 mL/hr over 240 Minutes Intravenous Every 8 hours 12/16/20 1734 12/17/20 0949   12/16/20 1800  metroNIDAZOLE (FLAGYL) IVPB 500 mg  Status:  Discontinued        500 mg 100 mL/hr over 60 Minutes Intravenous Every 8 hours 12/16/20 1451 12/16/20 1734   12/16/20 1515  valACYclovir (VALTREX) tablet 500 mg  Status:  Discontinued        500 mg Oral 2 times daily 12/16/20 1505 12/30/20 2048   12/16/20 0915  ciprofloxacin (CIPRO) IVPB 400 mg        400 mg 200 mL/hr over 60 Minutes Intravenous  Once 12/16/20 0912 12/16/20 1104   12/16/20 0915  metroNIDAZOLE (FLAGYL) IVPB 500 mg        500 mg 100 mL/hr over 60 Minutes Intravenous  Once 12/16/20 0912 12/16/20 1104       Assessment/Plan Polycystic disease with renal transplant 2013-Prograf/prednisone. Renalfollowing Elevated Cr CHF/uncertain etiology -Cards following Chronic COPD/history of tobacco use- On 2L PRN at home Pleuritic chest pain and SOB- VQ 1/25:Low probability pulmonary embolus. Age indeterminate, non occlusive deep vein thrombosis involving the right popliteal vein Hx  SVT Atrial flutter with RVR - on eliquis, which is currently on hold.  Likely reason for elevated INR of 1.8 yesterday and some oozing Hyponatremia - Per medicine -  POD 2, s/p Hartman's for Perforated sigmoid diverticulitis by Dr. Ninfa Linden on 2/6 - weaning on vent -cont OGT and await bowel function; however if she is extubated, can removed OGT and not place an NGT -NS WD dressing changes to midline wound BID -WOC consult for stoma care -cont zosyn -transfused 1 unit yesterday for hgb 6.6.  Now stable post transfusion from 7.3 to 7.6.  Did still have  some bleeding from her midline wound.  Wound be concerned starting heparin may exacerbate this, but hgb is stable so will defer to medical service on timing.   FEN: NPO/OGT/IVFs WN:[id number] 1/24;Zosyn1/24-1/25; Unasyn 1/25; zosyn 2/6 --> DVT:on hold due to surgery/oozing, could try today and see how she does?   LOS: 15 days    Henreitta Cea , [facility] 12/31/2020, 9:00 AM Please see Amion for pager number during day hours 7:00am-4:30pm or 7:00am -11:30am on weekends

## 2020-12-31 NOTE — Progress Notes (Addendum)
NAME:  April [name], MRN:  [id number], DOB:  27-Jan-1956, LOS: 22 ADMISSION DATE:  12/16/2020, CONSULTATION DATE:  12/29/2020 REFERRING MD:  Dr. Nevada Crane, CHIEF COMPLAINT:  Perf bowel, vent management    Brief History:  65 year old female past medical history of severe persistent asthma, chronic hypoxemic respiratory failure on 2 L nasal cannula, ESRD, status post deceased donor renal transplant in 2013 on immune suppression followed by [facility].  She has chronic diastolic heart failure, MGUS, hypertension.  Recently seen in the ER for uncomplicated sigmoid diverticulitis returned to the hospital with pain and fever.  Admitted to the hospital started on antibiotics developed atrial fibrillation with RVR.  Had a successful TEE with [facility] cardioversion.  On the morning of 12/29/2020 patient developed acute abdominal pain had CT imaging that was concerning for free air within the belly and a perforated diverticulitis s/p Hartman's.  PCCM consulted when she remains on the vent, pressors post op.  Past Medical History:   Past Medical History:  Diagnosis Date  . Arthritis   . Asthma   . CHF (congestive heart failure) ([place])   . COPD (chronic obstructive pulmonary disease) ([place])   . Depression   . Essential hypertension 02/08/2020  . GERD (gastroesophageal reflux disease)   . Hyperlipidemia   . Hyperparathyroidism   . Polycystic kidney   . PONV (postoperative nausea and vomiting)      Significant Hospital Events:  1/24 Admit 2/2 TEE guided cardioversion 2/6 Hartman's procedure for perf diverticulitis  Consults:  General surgery Cardiology Nephrology  Procedures:  12/29/2020: Exploratory laparotomy, sigmoid colectomy and end colostomy, central venous line, arterial line  Significant Diagnostic Tests:    Micro Data:  COVID-19 negative  Antimicrobials:   Zosyn 12/24-12/25, 2/6 >>  Interim History / Subjective:  Currently weaning on 5/5 Continues to have issues with apnea. Sats are  100%  Awake and alert Creatinine up to 1.41 WBC 11.6, afebrile HGB 7.6 Platelets 104 CXR 2/8>> with Slight progression of bibasilar atelectasis/infiltrate Small layering pleural effusions  Cardiomegaly with vascular congestion , Net + 2L Was on pressors overnight , but nursing feels was related to sedation/ pain medications Now off all sedation. Platelet count has dropped to 104 and bears watching   Objective   Blood pressure (!) 150/54, pulse 83, temperature 98 F (36.7 C), temperature source Axillary, resp. rate 20, height 5\' 1"  (1.549 m), weight 77.2 kg, SpO2 100 %.    Vent Mode: CPAP;PSV FiO2 (%):  [30 %-40 %] 40 % Set Rate:  [20 bmp] 20 bmp Vt Set:  [380 mL] 380 mL PEEP:  [5 cmH20] 5 cmH20 Pressure Support:  [5 cmH20] 5 cmH20 Plateau Pressure:  [14 cmH20-16 cmH20] 16 cmH20   Intake/Output Summary (Last 24 hours) at 12/31/2020 0931 Last data filed at 12/31/2020 0800 Gross per 24 hour  Intake 1149.1 ml  Output 420 ml  Net 729.1 ml   Filed Weights   12/27/20 0515 12/28/20 0500 12/29/20 0500  Weight: 78.4 kg 77 kg 77.2 kg    Examination: Gen:      No acute distress, chronically ill-appearing HEENT:  EOMI, sclera anicteric Neck:     No masses; no thyromegaly, ETT secure and intact Lungs:    Bilateral chest excursion, Clear to auscultation bilaterally; normal respiratory effort CV:         Regular rate and rhythm; no murmurs,  Abd:      + bowel sounds; soft, non-tender; no palpable masses, no distension Ext:  Significant bruising, ecchymosis, hematoma vs blood blister  over the left forearm. Skin:      Warm and dry; no rash, thin and bruised Neuro: Awake and alert, interactive  Labs/imaging personally reviewed.  Significant for BUN/creatinine76 /1.41, WBC 11.6, hemoglobin 7.6   Resolved Hospital Problem list     Assessment & Plan:  Acute hypoxic respiratory failure secondary to septic shock Due to perforated diverticulitis, peritonitis COPD, wears oxygen at  home Chronic hypoxemic respiratory failure on 2 L nasal cannula at home Cardiomegally with vascular congestion per CXR 2/8   Plan: Continue mechanical ventilation Wean PEEP/FiO2 Saturation goal should be 88-92% ( May be over-oxygenating) Wean pressors Continue Zosyn Continue stress dose steroids Will consider gentle diuresis once BP less labile  History of renal transplant. Immunosuppressed state Plan: Appreciate management by nephrology. On mycophenolate and tacrolimus Currently getting stress dose steroids.   Transition to home prednisone when hemodynamically stable Trend fever and WBC Trend BMET Replete electrolytes as needed Maintain renal perfusion.  Diabetes type 2 with hyperglycemia Steroid-induced hyperglycemia. Plan:  Holding Lantus due to hypoglycemia ( Will d/c, can restart when hypoglycemia improves) Continue SSI  Atrial flutter with RVR status post TEE and DCCV on 12/25/2020 Rate controlled 2/8 Plan: Anticoagulation was held for surgery today. Resume heparin when okay by surgery>> would opt for another 24-48 hours to ensure Hgb is stable, transfused 2/7 Maintain Mag > 2  Maintain K > 4  AKI Suspect multifactorial 2/2 intravascular volume depletion/ hypotension/ hypoperfusion/acute on chronic HF Plan: Follow urine output and creatinine Nephrology on board. Trend BMET Maintain renal perfusion   Best practice (evaluated daily)  Diet: NPO, will advance per surgery recommendations Pain/Anxiety/Delirium protocol (if indicated): PAD guidelines VAP protocol (if indicated): Yes DVT prophylaxis: Was on Eliquis, holding anticoagulation postop, PAS hose GI prophylaxis: PPI Glucose control: CBGs with SSI Mobility: Bedrest Disposition: ICU  Goals of Care:  Last date of multidisciplinary goals of care discussion: Family and staff present:  Summary of discussion:  Follow up goals of care discussion due:  Code Status: Full  Daughter in room at time of  extubation. She has been updated in full 2/8  Labs     Critical Care Time devoted to patient care services, exclusive of separately billable procedures, described in this note is 35 minutes.   Magdalen Spatz, MSN, AGACNP-BC [place] for personal pager  12/31/2020, 9:31 AM

## 2021-01-01 ENCOUNTER — Inpatient Hospital Stay (HOSPITAL_COMMUNITY): Payer: Medicare Other

## 2021-01-01 DIAGNOSIS — J9600 Acute respiratory failure, unspecified whether with hypoxia or hypercapnia: Secondary | ICD-10-CM

## 2021-01-01 DIAGNOSIS — I5033 Acute on chronic diastolic (congestive) heart failure: Secondary | ICD-10-CM | POA: Diagnosis not present

## 2021-01-01 DIAGNOSIS — I4892 Unspecified atrial flutter: Secondary | ICD-10-CM | POA: Diagnosis not present

## 2021-01-01 LAB — CBC WITH DIFFERENTIAL/PLATELET
Abs Immature Granulocytes: 0.13 10*3/uL — ABNORMAL HIGH (ref 0.00–0.07)
Basophils Absolute: 0 10*3/uL (ref 0.0–0.1)
Basophils Relative: 0 %
Eosinophils Absolute: 0 10*3/uL (ref 0.0–0.5)
Eosinophils Relative: 0 %
HCT: 22.1 % — ABNORMAL LOW (ref 36.0–46.0)
Hemoglobin: 6.7 g/dL — CL (ref 12.0–15.0)
Immature Granulocytes: 1 %
Lymphocytes Relative: 2 %
Lymphs Abs: 0.3 10*3/uL — ABNORMAL LOW (ref 0.7–4.0)
MCH: 32.4 pg (ref 26.0–34.0)
MCHC: 30.3 g/dL (ref 30.0–36.0)
MCV: 106.8 fL — ABNORMAL HIGH (ref 80.0–100.0)
Monocytes Absolute: 0.4 10*3/uL (ref 0.1–1.0)
Monocytes Relative: 3 %
Neutro Abs: 14.5 10*3/uL — ABNORMAL HIGH (ref 1.7–7.7)
Neutrophils Relative %: 94 %
Platelets: 109 10*3/uL — ABNORMAL LOW (ref 150–400)
RBC: 2.07 MIL/uL — ABNORMAL LOW (ref 3.87–5.11)
RDW: 20.8 % — ABNORMAL HIGH (ref 11.5–15.5)
WBC: 15.3 10*3/uL — ABNORMAL HIGH (ref 4.0–10.5)
nRBC: 0 % (ref 0.0–0.2)

## 2021-01-01 LAB — COMPREHENSIVE METABOLIC PANEL
ALT: 11 U/L (ref 0–44)
AST: 12 U/L — ABNORMAL LOW (ref 15–41)
Albumin: 2.3 g/dL — ABNORMAL LOW (ref 3.5–5.0)
Alkaline Phosphatase: 43 U/L (ref 38–126)
Anion gap: 9 (ref 5–15)
BUN: 69 mg/dL — ABNORMAL HIGH (ref 8–23)
CO2: 25 mmol/L (ref 22–32)
Calcium: 9.4 mg/dL (ref 8.9–10.3)
Chloride: 104 mmol/L (ref 98–111)
Creatinine, Ser: 1.43 mg/dL — ABNORMAL HIGH (ref 0.44–1.00)
GFR, Estimated: 41 mL/min — ABNORMAL LOW (ref >60)
Glucose, Bld: 104 mg/dL — ABNORMAL HIGH (ref 70–99)
Potassium: 4.9 mmol/L (ref 3.5–5.1)
Sodium: 138 mmol/L (ref 135–145)
Total Bilirubin: 1.8 mg/dL — ABNORMAL HIGH (ref 0.3–1.2)
Total Protein: 5 g/dL — ABNORMAL LOW (ref 6.5–8.1)

## 2021-01-01 LAB — GLUCOSE, CAPILLARY
Glucose-Capillary: 199 mg/dL — ABNORMAL HIGH (ref 70–99)
Glucose-Capillary: 201 mg/dL — ABNORMAL HIGH (ref 70–99)
Glucose-Capillary: 232 mg/dL — ABNORMAL HIGH (ref 70–99)
Glucose-Capillary: 74 mg/dL (ref 70–99)
Glucose-Capillary: 82 mg/dL (ref 70–99)
Glucose-Capillary: 98 mg/dL (ref 70–99)

## 2021-01-01 LAB — HEMOGLOBIN AND HEMATOCRIT, BLOOD
HCT: 25.3 % — ABNORMAL LOW (ref 36.0–46.0)
Hemoglobin: 8.1 g/dL — ABNORMAL LOW (ref 12.0–15.0)

## 2021-01-01 LAB — PHOSPHORUS: Phosphorus: 4.5 mg/dL (ref 2.5–4.6)

## 2021-01-01 LAB — PREPARE RBC (CROSSMATCH)

## 2021-01-01 MED ORDER — SILVER NITRATE-POT NITRATE 75-25 % EX MISC
1.0000 "application " | Freq: Once | CUTANEOUS | Status: AC
  Administered 2021-01-01: 1 via TOPICAL
  Filled 2021-01-01: qty 1

## 2021-01-01 MED ORDER — MYCOPHENOLATE SODIUM 180 MG PO TBEC
180.0000 mg | DELAYED_RELEASE_TABLET | Freq: Two times a day (BID) | ORAL | Status: DC
  Administered 2021-01-01 – 2021-01-14 (×28): 180 mg via ORAL
  Filled 2021-01-01 (×29): qty 1

## 2021-01-01 MED ORDER — SODIUM CHLORIDE 0.9% IV SOLUTION: Freq: Once | INTRAVENOUS | Status: DC

## 2021-01-01 MED ORDER — ACETAMINOPHEN 325 MG PO TABS
650.0000 mg | ORAL_TABLET | Freq: Four times a day (QID) | ORAL | Status: DC | PRN
  Administered 2021-01-02 – 2021-01-13 (×4): 650 mg via ORAL
  Filled 2021-01-01 (×5): qty 2

## 2021-01-01 MED ORDER — OXYCODONE HCL 5 MG PO TABS
5.0000 mg | ORAL_TABLET | Freq: Four times a day (QID) | ORAL | Status: DC | PRN
  Administered 2021-01-01 – 2021-01-12 (×11): 5 mg via ORAL
  Filled 2021-01-01 (×12): qty 1

## 2021-01-01 NOTE — Progress Notes (Signed)
PT Cancellation Note  Patient Details Name: April [name] MRN: [id number] DOB: 03-18-1956   Cancelled Treatment:    Reason Eval/Treat Not Completed: Medical issues which prohibited therapy, complains of abdominal pain. Will check back tomorrow.   Claretha Cooper 01/01/2021, 3:33 PM  [place] Pager [phone] Office [phone]

## 2021-01-01 NOTE — Progress Notes (Signed)
CRITICAL VALUE ALERT  Critical Value:  HGB 6.7  Date & Time Notied:  01/01/21 05:15 AM  Provider Notified: E-link notified   Orders Received/Actions taken: No new orders at current time

## 2021-01-01 NOTE — Progress Notes (Signed)
Pharmacy Antibiotic Note  April [name] is a 65 y.o. female admitted on 12/16/2020.  She developed perforation of diverticulitis and intra-abdominal infection.  Pharmacy has been consulted for Zosyn dosing.  Plan: Zosyn 3.375g IV q8h (4 hour infusion).  Monitor clinical progress and renal function F/U abx deescalation / LOT   Height: 5\' 1"  (154.9 cm) Weight: 77.2 kg (170 lb 3.1 oz) IBW/kg (Calculated) : 47.8  Temp (24hrs), Avg:98.2 F (36.8 C), Min:97.6 F (36.4 C), Max:98.8 F (37.1 C)  Recent Labs  Lab 12/29/20 0521 12/29/20 1525 12/30/20 0345 12/31/20 0530 01/01/21 0457  WBC 15.9* 15.2* 14.6* 11.6* 15.3*  CREATININE 1.42* 1.19* 1.27* 1.41* 1.43*    Estimated Creatinine Clearance: 37.9 mL/min (A) (by C-G formula based on SCr of 1.43 mg/dL (H)).    Allergies  Allergen Reactions  . Infed [Iron Dextran] Other (See Comments)    Chest tightness  . Pentamidine Itching, Shortness Of Breath and Swelling  . Budesonide-Formoterol Fumarate     Other reaction(s): hoarseness  . Bupropion     Other reaction(s): exacerbated depression  . Crestor [Rosuvastatin]     Other reaction(s): body aches, swelling  . Duloxetine Hcl     Other reaction(s): GI SE, weepy, worsening fatigue, foggy  . Erythromycin [Erythromycin] Other (See Comments)    Mouth Ulcers  . Iohexol Other (See Comments)    Per patient "she has had a kidney transplant and should never have contrast"  . Oxycodone Nausea Only and Nausea And Vomiting  . Pravastatin Sodium     Other reaction(s): myalgias  . Erythromycin Rash    Causes breakout in mouth  . Ultram [Tramadol Hcl] Anxiety    Antimicrobials this admission:  1/24 Zosyn >>1/25 1/25 Unasyn >> 1/31 1/31 Augmentin >> 2/5 2/6  Zosyn >>  Microbiology results:  1/25 BCx: canceled 1/31 MRSA PCR: negative 2/2 Covid: negative  Thank you for allowing pharmacy to be a part of this patient's care.  Gretta Arab PharmD, BCPS Clinical Pharmacist [facility]  main pharmacy [phone] 01/01/2021 9:55 AM

## 2021-01-01 NOTE — Evaluation (Signed)
Clinical/Bedside Swallow Evaluation Patient Details  Name: April [name] MRN: [id number] Date of Birth: [birth year]  Today's Date: 01/01/2021 Time: SLP Start Time (ACUTE ONLY): 5638 SLP Stop Time (ACUTE ONLY): 0935 SLP Time Calculation (min) (ACUTE ONLY): 40 min  Past Medical History:  Past Medical History:  Diagnosis Date  . Arthritis   . Asthma   . CHF (congestive heart failure) ([place])   . COPD (chronic obstructive pulmonary disease) ([place])   . Depression   . Essential hypertension 02/08/2020  . GERD (gastroesophageal reflux disease)   . Hyperlipidemia   . Hyperparathyroidism   . Polycystic kidney   . PONV (postoperative nausea and vomiting)    Past Surgical History:  Past Surgical History:  Procedure Laterality Date  . 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 BILATERAL OOPHORECTOMY    . BIOPSY  05/19/2020   Procedure: BIOPSY;  Surgeon: Carol Ada, MD;  Location: [place];  Service: Endoscopy;;  . CARDIOVERSION N/A 12/25/2020   Procedure: CARDIOVERSION;  Surgeon: Geralynn Rile, MD;  Location: [place];  Service: Cardiovascular;  Laterality: N/A;  . CARPAL TUNNEL RELEASE  2000  . CESAREAN SECTION    . COLONOSCOPY WITH PROPOFOL N/A 05/19/2020   Procedure: COLONOSCOPY WITH PROPOFOL;  Surgeon: Carol Ada, MD;  Location: [place];  Service: Endoscopy;  Laterality: N/A;  . ENTEROSCOPY N/A 05/19/2020   Procedure: ENTEROSCOPY;  Surgeon: Carol Ada, MD;  Location: [place];  Service: Endoscopy;  Laterality: N/A;  . INSERTION OF DIALYSIS CATHETER  03/31/2012   Procedure: INSERTION OF DIALYSIS CATHETER;  Surgeon: Angelia Mould, MD;  Location: [place];  Service: Vascular;  Laterality: N/A;  insertion of dialysis catheter right internal jugular vein  . 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 LAPAROTOMY N/A 12/29/2020   Procedure: EXPLORATORY LAPAROTOMY, SIGMOID COLECTOMY AND COLOSTOMY;  Surgeon: Coralie Keens, MD;  Location: [facility];   Service: General;  Laterality: N/A;  Packed Dressing  . ORIF TIBIA FRACTURE Left 12/23/2018   Procedure: OPEN REDUCTION INTERNAL FIXATION (ORIF) TIBIA FRACTURE;  Surgeon: Shona Needles, MD;  Location: [place];  Service: Orthopedics;  Laterality: Left;  . ORIF TIBIA PLATEAU Right 12/23/2018   Procedure: OPEN REDUCTION INTERNAL FIXATION (ORIF) TIBIAL PLATEAU;  Surgeon: Shona Needles, MD;  Location: [place];  Service: Orthopedics;  Laterality: Right;  . ORIF WRIST FRACTURE Left 12/08/2019   Procedure: OPEN REDUCTION INTERNAL FIXATION (ORIF) WRIST FRACTURE, REPAIR LACERATION LEFT FOREARM;  Surgeon: Dorna Leitz, MD;  Location: [facility];  Service: Orthopedics;  Laterality: Left;  . TEE WITHOUT CARDIOVERSION N/A 12/25/2020   Procedure: TRANSESOPHAGEAL ECHOCARDIOGRAM (TEE);  Surgeon: Geralynn Rile, MD;  Location: [place];  Service: Cardiovascular;  Laterality: N/A;  . [place]  . TOTAL HIP ARTHROPLASTY Left 12/08/2019   Procedure: TOTAL HIP ARTHROPLASTY ANTERIOR APPROACH;  Surgeon: Dorna Leitz, MD;  Location: [facility];  Service: Orthopedics;  Laterality: Left;  . TUBAL LIGATION  2010   HPI:  Pt is a 65 yo female with h/o COPD, immunosuppresive, ESRD s/p renal implant admitted to hospital on 12/16/2020.  On 12/29/2020 pt underwent EXPLORATORY LAPAROTOMY, SIGMOID COLECTOMY AND COLOSTOMY 2/6- intubated for surgery, extubated 2/8.  CXR 2/8 Slight progression of bibasilar atelectasis/infiltrate with small layering pleural effusions visible on the current study.  Swallow evaluation ordered.  Per dietician note, "She does not have any chewing difficulties and was previously evaluated for possible swallowing difficulty but it was determined that this was likely more  of a breathing issue than a swallowing issue."  SLP noted pt underwent MBS 01/2020 at [facility] when admitted with CHF - MBS was unremarkable and odynophagia deemed to be pt's issue.  Pt does admit to "choking" sometimes on liquids - has h/o  GERD.  She states she knows what to do to manage her dysphagia however.   Assessment / Plan / Recommendation Clinical Impression  Limited swallow evaluation completed due to pt's diet limitation. No focal CN deficits, nor indication of airway compromise with small boluses consumed (thin water via tsp, cup and jello).  Voice is hoarse and pt assigns level 6 of 1-10 - stating it has improved.  Volitional cough is weak, limited by her new ostomy site causing pain.  Pt admits to "choking" some on liquids - thin- prior to admit.  Denies issues with Ensure, etc - thus advised she consider Ensure, V8, etc with meals for maximal comfort when diet can be advanced.  Pt also admits to some discomfort if consuming cold drinks and admits to having to expectorate food on occasion. Food is accompanied by frothy secretions - causing SLP to question if GI source.  Regardless, recommend clear liquids with strict precautions.  Informed pt of importance of using incentive spirometer and aspiration precautions due to her deconditioning, cough strength limited by pain, compromised immune system increasing her pna risk.  Given prolonged hospital coarse and some baseline dysphagia - SLP will follow up briefly to assure tolerance.  Recommend soft/thin diet when surgery team indicates readiness for advancement.  Thanks for this consult. SLP Visit Diagnosis: Dysphagia, unspecified (R13.10)    Aspiration Risk  Mild aspiration risk    Diet Recommendation Dysphagia 3 (Mech soft);Thin liquid   Liquid Administration via: Cup;Straw Medication Administration: Whole meds with liquid Supervision: Patient able to self feed Compensations: Slow rate;Small sips/bites Postural Changes: Seated upright at 90 degrees;Remain upright for at least 30 minutes after po intake    Other  Recommendations Oral Care Recommendations: Oral care BID   Follow up Recommendations        Frequency and Duration min 2x/week  1 week       Prognosis  Prognosis for Safe Diet Advancement: Good      Swallow Study   General Date of Onset: 01/01/21 HPI: Pt is a 65 yo female with h/o COPD, immunosuppresive, ESRD s/p renal implant admitted to hospital on 12/16/2020.  On 12/29/2020 pt underwent EXPLORATORY LAPAROTOMY, SIGMOID COLECTOMY AND COLOSTOMY 2/6- intubated for surgery, extubated 2/8.  CXR 2/8 Slight progression of bibasilar atelectasis/infiltrate with small layering pleural effusions visible on the current study.  Swallow evaluation ordered.  Per dietician note, "She does not have any chewing difficulties and was previously evaluated for possible swallowing difficulty but it was determined that this was likely more of a breathing issue than a swallowing issue."  SLP noted pt underwent MBS 01/2020 at [facility] when admitted with CHF - MBS was unremarkable and odynophagia deemed to be pt's issue.  Pt does admit to "choking" sometimes on liquids - has h/o GERD.  She states she knows what to do to manage her dysphagia however. Type of Study: Bedside Swallow Evaluation Previous Swallow Assessment: 01/2020 MBS at [facility] - unremarkable per MD note - can not locate report Diet Prior to this Study: NPO Temperature Spikes Noted: No Respiratory Status: Nasal cannula History of Recent Intubation: Yes Length of Intubations (days): 3 days Date extubated: 12/31/20 Behavior/Cognition: Alert;Cooperative;Pleasant mood Oral Cavity Assessment: Dry Oral Care Completed  by SLP: Yes (pt brushed her teeth using the oral suction kit) Self-Feeding Abilities: Needs assist Patient Positioning: Upright in bed Baseline Vocal Quality: Hoarse Volitional Cough: Weak;Other (Comment) (limited by pain from ostomy site) Volitional Swallow: Unable to elicit    Oral/Motor/Sensory Function Overall Oral Motor/Sensory Function: Within functional limits   Ice Chips Ice chips: Within functional limits Presentation: Spoon   Thin Liquid Thin Liquid: Within functional  limits Presentation: Cup;Self Fed    Nectar Thick Nectar Thick Liquid: Not tested   Honey Thick Honey Thick Liquid: Not tested   Puree Puree: Impaired (JELLO) Other Comments: pt reported sensation of secretions or jello in her pharynx  but no indication of aspiration   Solid     Solid: Not tested Other Comments: NT due to pt's limited diet of clears      [name] 01/01/2021,11:14 AM   Kathleen Lime, MS [place] Office [phone] Pager [phone]

## 2021-01-01 NOTE — Progress Notes (Addendum)
Progress Note  Patient Name: April [name] Date of Encounter: 01/01/2021  Primary Cardiologist: Sanda Klein, MD   Subjective   Feeling ok today. Having more abdominal pain. Extubated 2/8  Inpatient Medications    Scheduled Meds: . sodium chloride   Intravenous Once  . sodium chloride   Intravenous Once  . brimonidine  1 drop Both Eyes BID  . Chlorhexidine Gluconate Cloth  6 each Topical Daily  . cycloSPORINE  1 drop Both Eyes BID  . dorzolamide  1 drop Both Eyes Daily  . insulin aspart  0-15 Units Subcutaneous Q4H  . latanoprost  1 drop Both Eyes QHS  . mouth rinse  15 mL Mouth Rinse BID  . methylPREDNISolone (SOLU-MEDROL) injection  10 mg Intravenous Daily  . mycophenolate  180 mg Per Tube BID  . pantoprazole (PROTONIX) IV  40 mg Intravenous Daily  . silver nitrate applicators  1 application Topical Once  . tacrolimus  1 mg Oral BID  . valACYclovir  500 mg Oral BID  . cyanocobalamin  1,000 mcg Oral Daily   Continuous Infusions: . norepinephrine (LEVOPHED) Adult infusion 0 mcg/min (12/31/20 0750)  . piperacillin-tazobactam (ZOSYN)  IV 12.5 mL/hr at 01/01/21 0700   PRN Meds: fentaNYL (SUBLIMAZE) injection, lip balm, naLOXone (NARCAN)  injection, [DISCONTINUED] ondansetron **OR** ondansetron (ZOFRAN) IV   Vital Signs    Vitals:   01/01/21 0637 01/01/21 0700 01/01/21 0725 01/01/21 0800  BP:      Pulse: 89 91 88 89  Resp: 20 16    Temp: 97.6 F (36.4 C)   97.7 F (36.5 C)  TempSrc:    Oral  SpO2:  100% 100% 100%  Weight:      Height:        Intake/Output Summary (Last 24 hours) at 01/01/2021 0903 Last data filed at 01/01/2021 0700 Gross per 24 hour  Intake 239.54 ml  Output 975 ml  Net -735.46 ml   Filed Weights   12/27/20 0515 12/28/20 0500 12/29/20 0500  Weight: 78.4 kg 77 kg 77.2 kg    Physical Exam   General: Ill appearing, NAD Neck: No JVD Lungs: Diminished bilaterally. Breathing is unlabored. Cardiovascular: RRR with S1 S2. No  murmurs Abdomen: Midline incision, tender. No obvious abdominal masses. Extremities: 3+ pitting foot and ankle edema. Radial pulses 2+ bilaterally Neuro: Alert and oriented. No focal deficits. No facial asymmetry. MAE spontaneously. Psych: Responds to questions appropriately with normal affect.    Labs    Chemistry Recent Labs  Lab 12/30/20 0345 12/31/20 0530 01/01/21 0457  NA 137 139 138  K 4.8 4.2 4.9  CL 103 104 104  CO2 25 25 25   GLUCOSE 130* 96 104*  BUN 82* 76* 69*  CREATININE 1.27* 1.41* 1.43*  CALCIUM 9.4 9.6 9.4  PROT 4.8* 4.8* 5.0*  ALBUMIN 2.5* 2.4* 2.3*  AST 11* 11* 12*  ALT 13 11 11   ALKPHOS 40 45 43  BILITOT 2.3* 2.6* 1.8*  GFRNONAA 48* 42* 41*  ANIONGAP 9 10 9      Hematology Recent Labs  Lab 12/30/20 0345 12/30/20 1218 12/30/20 1937 12/31/20 0530 01/01/21 0457  WBC 14.6*  --   --  11.6* 15.3*  RBC 2.23*  --   --  2.37* 2.07*  HGB 7.2*   < > 7.3* 7.6* 6.7*  HCT 23.0*   < > 23.0* 24.0* 22.1*  MCV 103.1*  --   --  101.3* 106.8*  MCH 32.3  --   --  32.1 32.4  MCHC 31.3  --   --  31.7 30.3  RDW 19.3*  --   --  20.5* 20.8*  PLT 119*  --   --  104* 109*   < > = values in this interval not displayed.    Cardiac EnzymesNo results for input(s): TROPONINI in the last 168 hours. No results for input(s): TROPIPOC in the last 168 hours.   BNPNo results for input(s): BNP, PROBNP in the last 168 hours.   DDimer No results for input(s): DDIMER in the last 168 hours.   Radiology    DG CHEST PORT 1 VIEW  Result Date: 01/01/2021 CLINICAL DATA:  65 year old female with respiratory failure. Renal transplant. Recent suspected bowel perforation on CT Abdomen and Pelvis. EXAM: PORTABLE CHEST 1 VIEW COMPARISON:  Portable chest 12/31/2020 and earlier. FINDINGS: Portable AP semi upright view at 0453 hours. Extubated. Enteric tube removed. Stable left IJ central line. The patient is slightly more rotated to the right. Stable cardiac size and mediastinal contours.  Veiling bilateral pulmonary opacity, suspicious for increased pleural effusions since the CT on 12/29/2020. Associated dense lung base opacity. No pneumothorax. Upper lung pulmonary vascularity appears normal. Paucity of bowel gas in the upper abdomen. IMPRESSION: 1. Extubated and enteric tube removed. Stable left IJ central line. 2. Veiling and confluent bilateral lung base opacity suspicious for increased pleural effusions and lower lobe collapse or consolidation since the abdomen CT on 12/29/2020. 3. No overt pulmonary edema. Electronically Signed   By: Genevie Ann M.D.   On: 01/01/2021 08:02   DG Chest Port 1 View  Result Date: 12/31/2020 CLINICAL DATA:  Respiratory failure. EXAM: PORTABLE CHEST 1 VIEW COMPARISON:  12/29/2020 FINDINGS: u 0451 hours. Endotracheal tube tip is 4.4 cm above the base of the carina. The NG tube passes into the stomach although the distal tip position is not included on the film. Left IJ central line tip overlies the innominate vein confluence, stable. The cardio pericardial silhouette is enlarged. There is pulmonary vascular congestion without overt pulmonary edema. Bibasilar atelectasis/infiltrate again noted with probable small bilateral pleural effusions. IMPRESSION: Slight progression of bibasilar atelectasis/infiltrate with small layering pleural effusions visible on the current study. Cardiomegaly with vascular congestion. Electronically Signed   By: Misty Stanley M.D.   On: 12/31/2020 07:40   Telemetry    01/01/21 NSR with rates in the 80-90's- Personally Reviewed  ECG    No new tracing as of 01/01/21- Personally Reviewed  Cardiac Studies   ECHO 12/17/20  IMPRESSIONS   1. Left ventricular ejection fraction, by estimation, is 70 to 75%. The  left ventricle has hyperdynamic function. The left ventricle has no  regional wall motion abnormalities. Left ventricular diastolic parameters  are consistent with Grade II diastolic  dysfunction (pseudonormalization).  Elevated left atrial pressure.  2. Right ventricular systolic function is normal. The right ventricular  size is normal. There is normal pulmonary artery systolic pressure.  3. The mitral valve is normal in structure. No evidence of mitral valve  regurgitation. No evidence of mitral stenosis.  4. The aortic valve is tricuspid. Aortic valve regurgitation is not  visualized. No aortic stenosis is present.  5. The inferior vena cava is normal in size with greater than 50%  respiratory variability, suggesting right atrial pressure of 3 mmHg.   Patient Profile     65 y.o. female with a hx of HTN, GERD, COPD, MGUS, HFpEF,SVT 01/2020,ESRD with renal transplant 2013 on immunosuppressants followed at [facility] and admitted 1.24.22  with diverticulitis after failing outpt therapy. Initially followed by cardiology for hypotension, however now with new onset atrial flutter with RVR.   Assessment & Plan    1. Atrial flutter with RVR: -New onset this admission>>underwent successful TEE/DCCV 12/25/20 -Maintaining NSR with rates in the 80-90 range  -Restart IV heparin once OK per surgery  2. Septic shock secondary to perforated sigmoid diverticulitis: -s/p Hartmans procedure -Off pressor and extubated 12/31/20 -Continue abx per primary team   3. Acute on chronic diastolic CHF: -Weight, 037DK today with admission weight at 165lb -I&O, net positive 1.7 -pitting foot edema>>consider the addition of short course Lasix however will leave this to nephrology  -Breathing ok   4. Acute on chronic CKD with hx of renal transplant: -Management per nephrology  -Cr 1.43 today   Signed, Kathyrn Drown NP-C [place] Pager: [phone] 01/01/2021, 9:03 AM     For questions or updates, please contact   Please consult [email or web address] for contact info under Cardiology/STEMI.  Patient seen and examined.  Agree with above documentation.  On exam, patient is alert and oriented, regular rate and rhythm, no murmurs,  lungs CTAB, no JVD. Telemetry reviewed, maintaining sinus rhythm.  Required PRBC transfusion for hemoglobin down to 6.7 this morning.  Heparin on hold.  Would restart once okay from surgery perspective.  Donato Heinz, MD

## 2021-01-01 NOTE — Progress Notes (Signed)
[place] Progress Note Patient Name: April [name] DOB: 12/28/1955 MRN: [id number]   Date of Service  01/01/2021  HPI/Events of Note  Anemia - Hgb = 6.7.   eICU Interventions  Will transfuse 1 unit PRBC now.      Intervention Category Major Interventions: Other:  Kruze Atchley Cornelia Copa 01/01/2021, 5:23 AM

## 2021-01-01 NOTE — Progress Notes (Signed)
Per patient's request, this writer will return around midnight to assist with nocturnal cpap.

## 2021-01-01 NOTE — Progress Notes (Signed)
3 Days Post-Op  Subjective: Patient extubated.  No nausea with OGT out.  Ostomy not working yet.  Midline wound still oozing.  Some pain, but fairly well controlled.   ROS: See above, otherwise other systems negative  Objective: Vital signs in last 24 hours: Temp:  [97.6 F (36.4 C)-98.8 F (37.1 C)] 97.9 F (36.6 C) (02/09 0915) Pulse Rate:  [80-97] 89 (02/09 0800) Resp:  [11-30] 16 (02/09 0700) BP: (147)/(57) 147/57 (02/09 0000) SpO2:  [96 %-100 %] 100 % (02/09 0915) Arterial Line BP: (92-159)/(44-59) 155/58 (02/09 0915) FiO2 (%):  [30 %] 30 % (02/08 1126) Last BM Date: 12/29/20  Intake/Output from previous day: 02/08 0701 - 02/09 0700 In: 262.7 [I.V.:25.2; IV Piggyback:237.5] Out: 975 [Urine:975] Intake/Output this shift: Total I/O In: 371.3 [Blood:346; IV Piggyback:25.3] Out: -   PE: Abd: soft, appropriately tender, +BS, but not output yet from her stoma.  Stoma is pink and viable.  Midline clean but still oozing from both skin edges.  Silver nitrate sticks used as well as pressure and hemostasis achieved.  Packing replaced.  Lab Results:  Recent Labs    12/31/20 0530 01/01/21 0457  WBC 11.6* 15.3*  HGB 7.6* 6.7*  HCT 24.0* 22.1*  PLT 104* 109*   BMET Recent Labs    12/31/20 0530 01/01/21 0457  NA 139 138  K 4.2 4.9  CL 104 104  CO2 25 25  GLUCOSE 96 104*  BUN 76* 69*  CREATININE 1.41* 1.43*  CALCIUM 9.6 9.4   PT/INR Recent Labs    12/29/20 1630  LABPROT 20.1*  INR 1.8*   CMP     Component Value Date/Time   NA 138 01/01/2021 0457   K 4.9 01/01/2021 0457   CL 104 01/01/2021 0457   CO2 25 01/01/2021 0457   GLUCOSE 104 (H) 01/01/2021 0457   BUN 69 (H) 01/01/2021 0457   CREATININE 1.43 (H) 01/01/2021 0457   CREATININE 1.28 (H) 11/04/2020 0952   CALCIUM 9.4 01/01/2021 0457   CALCIUM 10.8 (H) 10/13/2011 1405   PROT 5.0 (L) 01/01/2021 0457   ALBUMIN 2.3 (L) 01/01/2021 0457   AST 12 (L) 01/01/2021 0457   AST 13 (L) 11/04/2020 0952    ALT 11 01/01/2021 0457   ALT 12 11/04/2020 0952   ALKPHOS 43 01/01/2021 0457   BILITOT 1.8 (H) 01/01/2021 0457   BILITOT 0.6 11/04/2020 0952   GFRNONAA 41 (L) 01/01/2021 0457   GFRNONAA 47 (L) 11/04/2020 0952   GFRAA 51 (L) 08/05/2020 1523   Lipase     Component Value Date/Time   LIPASE 49 12/29/2020 0822       Studies/Results: DG CHEST PORT 1 VIEW  Result Date: 01/01/2021 CLINICAL DATA:  65 year old female with respiratory failure. Renal transplant. Recent suspected bowel perforation on CT Abdomen and Pelvis. EXAM: PORTABLE CHEST 1 VIEW COMPARISON:  Portable chest 12/31/2020 and earlier. FINDINGS: Portable AP semi upright view at 0453 hours. Extubated. Enteric tube removed. Stable left IJ central line. The patient is slightly more rotated to the right. Stable cardiac size and mediastinal contours. Veiling bilateral pulmonary opacity, suspicious for increased pleural effusions since the CT on 12/29/2020. Associated dense lung base opacity. No pneumothorax. Upper lung pulmonary vascularity appears normal. Paucity of bowel gas in the upper abdomen. IMPRESSION: 1. Extubated and enteric tube removed. Stable left IJ central line. 2. Veiling and confluent bilateral lung base opacity suspicious for increased pleural effusions and lower lobe collapse or consolidation since the abdomen CT on 12/29/2020.  3. No overt pulmonary edema. Electronically Signed   By: Genevie Ann M.D.   On: 01/01/2021 08:02   DG Chest Port 1 View  Result Date: 12/31/2020 CLINICAL DATA:  Respiratory failure. EXAM: PORTABLE CHEST 1 VIEW COMPARISON:  12/29/2020 FINDINGS: u 0451 hours. Endotracheal tube tip is 4.4 cm above the base of the carina. The NG tube passes into the stomach although the distal tip position is not included on the film. Left IJ central line tip overlies the innominate vein confluence, stable. The cardio pericardial silhouette is enlarged. There is pulmonary vascular congestion without overt pulmonary edema.  Bibasilar atelectasis/infiltrate again noted with probable small bilateral pleural effusions. IMPRESSION: Slight progression of bibasilar atelectasis/infiltrate with small layering pleural effusions visible on the current study. Cardiomegaly with vascular congestion. Electronically Signed   By: Misty Stanley M.D.   On: 12/31/2020 07:40    Anti-infectives: Anti-infectives (From admission, onward)   Start     Dose/Rate Route Frequency Ordered Stop   12/31/20 2200  valACYclovir (VALTREX) tablet 500 mg        500 mg Oral 2 times daily 12/31/20 2100     12/31/20 1000  valACYclovir (VALTREX) compounded oral suspension 500 mg  Status:  Discontinued        500 mg Per Tube 2 times daily 12/30/20 2051 12/31/20 2100   12/29/20 1045  piperacillin-tazobactam (ZOSYN) IVPB 3.375 g        3.375 g 12.5 mL/hr over 240 Minutes Intravenous Every 8 hours 12/29/20 0956     12/24/20 0800  ciprofloxacin (CIPRO) tablet 500 mg  Status:  Discontinued        500 mg Oral Daily with breakfast 12/23/20 1721 12/23/20 1737   12/23/20 2200  metroNIDAZOLE (FLAGYL) tablet 500 mg  Status:  Discontinued        500 mg Oral Every 8 hours 12/23/20 1721 12/23/20 1737   12/23/20 2200  amoxicillin-clavulanate (AUGMENTIN) 500-125 MG per tablet 500 mg  Status:  Discontinued        1 tablet Oral 2 times daily 12/23/20 1739 12/29/20 0949   12/18/20 1100  Ampicillin-Sulbactam (UNASYN) 3 g in sodium chloride 0.9 % 100 mL IVPB  Status:  Discontinued        3 g 200 mL/hr over 30 Minutes Intravenous Every 12 hours 12/18/20 0729 12/23/20 1721   12/17/20 1400  Ampicillin-Sulbactam (UNASYN) 3 g in sodium chloride 0.9 % 100 mL IVPB  Status:  Discontinued        3 g 200 mL/hr over 30 Minutes Intravenous Every 6 hours 12/17/20 1004 12/18/20 0729   12/16/20 2200  ciprofloxacin (CIPRO) IVPB 400 mg  Status:  Discontinued        400 mg 200 mL/hr over 60 Minutes Intravenous Every 12 hours 12/16/20 1451 12/16/20 1734   12/16/20 2200   piperacillin-tazobactam (ZOSYN) IVPB 3.375 g  Status:  Discontinued        3.375 g 12.5 mL/hr over 240 Minutes Intravenous Every 8 hours 12/16/20 1734 12/17/20 0949   12/16/20 1800  metroNIDAZOLE (FLAGYL) IVPB 500 mg  Status:  Discontinued        500 mg 100 mL/hr over 60 Minutes Intravenous Every 8 hours 12/16/20 1451 12/16/20 1734   12/16/20 1515  valACYclovir (VALTREX) tablet 500 mg  Status:  Discontinued        500 mg Oral 2 times daily 12/16/20 1505 12/30/20 2048   12/16/20 0915  ciprofloxacin (CIPRO) IVPB 400 mg  400 mg 200 mL/hr over 60 Minutes Intravenous  Once 12/16/20 0912 12/16/20 1104   12/16/20 0915  metroNIDAZOLE (FLAGYL) IVPB 500 mg        500 mg 100 mL/hr over 60 Minutes Intravenous  Once 12/16/20 0912 12/16/20 1104       Assessment/Plan Polycystic disease with renal transplant 2013-Prograf/prednisone. Renalfollowing Elevated Cr CHF/uncertain etiology -Cards following Chronic COPD/history of tobacco use- On 2L PRN at home Pleuritic chest pain and SOB- VQ 1/25:Low probability pulmonary embolus. Age indeterminate, non occlusive deep vein thrombosis involving the right popliteal vein Hx SVT Atrial flutter with RVR - on eliquis, which is currently on hold.  Likely reason for elevated INR of 1.8 yesterday and some oozing Hyponatremia - Per medicine -  POD 3, s/p Hartman's for Perforated sigmoid diverticulitis by Dr. Ninfa Linden on 2/6 - extubated yesterday -no nausea with OGT removed -passed swallow eval today so will start clear liquids -wound silver nitrated today to stop oozing.   -cont to hold heparin til hgb stabilizes out. -mobilize, PT eval -IS/pulm toilet   FEN:CLD/IVFs YB:[id number] 1/24;Zosyn1/24-1/25; Unasyn 1/25; zosyn 2/6 --> GOT:LXBW due to persistent wound oozing  LOS: 16 days    Henreitta Cea , [facility] 01/01/2021, 10:21 AM Please see Amion for pager number during day hours 7:00am-4:30pm or 7:00am  -11:30am on weekends

## 2021-01-01 NOTE — Progress Notes (Signed)
[facility] Progress Note  Subjective: seen in ICU, extubated, good UOP. Creat 1.40 today.   Vitals:   01/01/21 1100 01/01/21 1200 01/01/21 1400 01/01/21 1451  BP:      Pulse: 92 91 98 97  Resp: 18 11 (!) 25 20  Temp:  (!) 97.2 F (36.2 C)    TempSrc:  Axillary    SpO2: 100% 100% 100% 100%  Weight:      Height:        Exam: Gen: off the vent, alert , chron ill appearing as usual CVS:RRR Resp: cta Abd: bandages not removed, L sided ostomy bag Ext:1+ bilat UE and LE edema Neuro: alert, Ox 3 , tired    Assessment/Plan: 1. AKI on CKD stage 3a- b/l creatinine 1.1- 1.5. AKI due to volume depletion, N/V, hypotension d/t sepsis, 3rd spacing and a/c diast CHF.Scr peaked at 2.71, now down to low 1's and stable. No further suggestions. Will be available as needed. Will sign off.   2. 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  3. Adrenal insufficiency.Low am cortisol, noted renin/aldoinprocess 4. Atrial flutter with RVR- started on heparin gtt and s/p successful DCCV2/2/22. Cardiology following and pt now on metoprolol 5. A/C diast CHF: no resp issues , vol up somewhat  6. S/p kidney transplant- Continue prograf/myfortic. Prograf level in progress.  Note on solumedrol taper, her home prednisone dose is 7.5 mg daily and should not be tapered lower than that 7. COPD- per PCCM   Kelly Splinter, MD 12/31/2020, 3:09 PM   Recent Labs  Lab 12/31/20 0530 01/01/21 0457 01/01/21 1204  K 4.2 4.9  --   BUN 76* 69*  --   CREATININE 1.41* 1.43*  --   CALCIUM 9.6 9.4  --   PHOS 3.5 4.5  --   HGB 7.6* 6.7* 8.1*   Inpatient medications: . sodium chloride   Intravenous Once  . sodium chloride   Intravenous Once  . brimonidine  1 drop Both Eyes BID  . Chlorhexidine Gluconate Cloth  6 each Topical Daily  . cycloSPORINE  1 drop Both Eyes BID  . dorzolamide  1 drop Both Eyes Daily  . insulin aspart  0-15 Units  Subcutaneous Q4H  . latanoprost  1 drop Both Eyes QHS  . mouth rinse  15 mL Mouth Rinse BID  . methylPREDNISolone (SOLU-MEDROL) injection  10 mg Intravenous Daily  . mycophenolate  180 mg Oral BID  . pantoprazole (PROTONIX) IV  40 mg Intravenous Daily  . tacrolimus  1 mg Oral BID  . valACYclovir  500 mg Oral BID  . cyanocobalamin  1,000 mcg Oral Daily   . norepinephrine (LEVOPHED) Adult infusion 0 mcg/min (12/31/20 0750)  . piperacillin-tazobactam (ZOSYN)  IV 3.375 g (01/01/21 1421)   acetaminophen, fentaNYL (SUBLIMAZE) injection, lip balm, naLOXone (NARCAN)  injection, [DISCONTINUED] ondansetron **OR** ondansetron (ZOFRAN) IV, oxyCODONE

## 2021-01-01 NOTE — Progress Notes (Addendum)
NAME:  April [name], MRN:  [id number], DOB:  Jun 29, 1956, LOS: 45 ADMISSION DATE:  12/16/2020, CONSULTATION DATE:  12/29/2020 REFERRING MD:  Dr. Nevada Crane, CHIEF COMPLAINT:  Perf bowel, vent management    Brief History:  65 year old female past medical history of severe persistent asthma, chronic hypoxemic respiratory failure on 2 L nasal cannula, ESRD, status post deceased donor renal transplant in 2013 on immune suppression followed by [facility].  She has chronic diastolic heart failure, MGUS, hypertension.  Recently seen in the ER for uncomplicated sigmoid diverticulitis returned to the hospital with pain and fever.  Admitted to the hospital started on antibiotics developed atrial fibrillation with RVR.  Had a successful TEE with [facility] cardioversion.  On the morning of 12/29/2020 patient developed acute abdominal pain had CT imaging that was concerning for free air within the belly and a perforated diverticulitis s/p Hartman's.  PCCM consulted when she remains on the vent, pressors post op.  Past Medical History:   Past Medical History:  Diagnosis Date  . Arthritis   . Asthma   . CHF (congestive heart failure) ([place])   . COPD (chronic obstructive pulmonary disease) ([place])   . Depression   . Essential hypertension 02/08/2020  . GERD (gastroesophageal reflux disease)   . Hyperlipidemia   . Hyperparathyroidism   . Polycystic kidney   . PONV (postoperative nausea and vomiting)      Significant Hospital Events:  1/24 Admit 2/2 TEE guided cardioversion 2/6 Hartman's procedure for perf diverticulitis, remains on vent post op 2/8 Extubated, PRBC for low Hb  Consults:  General surgery Cardiology Nephrology  Procedures:  12/29/2020: Exploratory laparotomy, sigmoid colectomy and end colostomy, central venous line, arterial line  Significant Diagnostic Tests:    Micro Data:  COVID-19 negative  Antimicrobials:   Zosyn 12/24-12/25, 2/6 >>  Interim History / Subjective:   Extubated  yesterday. Hb dropped with still some oozing from the incision site PRBC transfused. Remains off pressors.  Objective   Blood pressure (!) 147/57, pulse 89, temperature 97.9 F (36.6 C), temperature source Axillary, resp. rate 16, height 5\' 1"  (1.549 m), weight 77.2 kg, SpO2 100 %.    Vent Mode: CPAP;PSV FiO2 (%):  [30 %] 30 % Set Rate:  [20 bmp] 20 bmp Vt Set:  [380 mL] 380 mL PEEP:  [5 cmH20] 5 cmH20 Pressure Support:  [5 cmH20] 5 cmH20   Intake/Output Summary (Last 24 hours) at 01/01/2021 0953 Last data filed at 01/01/2021 0915 Gross per 24 hour  Intake 610.84 ml  Output 975 ml  Net -364.16 ml   Filed Weights   12/27/20 0515 12/28/20 0500 12/29/20 0500  Weight: 78.4 kg 77 kg 77.2 kg    Examination: Gen:      No acute distress, chronically ill-appearing HEENT:  EOMI, sclera anicteric Neck:     No masses; no thyromegaly, ETT secure and intact Lungs:    Bilateral chest excursion, Clear to auscultation bilaterally; normal respiratory effort CV:         Regular rate and rhythm; no murmurs,  Abd:      + bowel sounds; soft, non-tender; no palpable masses, no distension Ext:    Significant bruising, ecchymosis, hematoma vs blood blister  over the left forearm. Skin:      Warm and dry; no rash, thin and bruised Neuro: Awake and alert, interactive  Labs/imaging personally reviewed.  Significant for BUN/creatinine76 /1.41, WBC 15.3, hemoglobin 6.7, platelets 109   Resolved Hospital Problem list  Assessment & Plan:  Acute hypoxic respiratory failure secondary to septic shock Due to perforated diverticulitis, peritonitis COPD, wears oxygen at home Chronic hypoxemic respiratory failure on 2 L nasal cannula at home Cardiomegally with vascular congestion per CXR 2/8  Plan: Continue mechanical ventilation Wean PEEP/FiO2 Saturation goal should be 88-92% ( May be over-oxygenating) Wean pressors Continue Zosyn  Suspected OSA OSA suspected given apneic episodes at  night Continue CPAP for now at night while she is in the hospital Will need sleep study as an outpatient  History of renal transplant. Immunosuppressed state Plan: Appreciate management by nephrology. On mycophenolate and tacrolimus Stress steroids changed to Solu-Medrol 10 mg/day.  She is on home baseline prednisone of 7.5 mg/day  Diabetes type 2 with hyperglycemia Steroid-induced hyperglycemia. Plan:  Off Lantus due to hypoglycemia Continue SSI  Atrial flutter with RVR status post TEE and DCCV on 12/25/2020 Rate controlled 2/8 Plan: Continue to hold anticoagulation as she has slow oozing from the wound with need for blood transfusion Resume heparin when okay by surgery>> would opt for another 24-48 hours to ensure Hgb is stable, transfused 2/7 and 2/9   AKI Suspect multifactorial 2/2 intravascular volume depletion/ hypotension/ hypoperfusion/acute on chronic HF Plan: Follow urine output and creatinine Nephrology on board. Trend BMET Maintain renal perfusion   Best practice (evaluated daily)  Diet: Advance per surgery recommendations Pain/Anxiety/Delirium protocol (if indicated): PAD guidelines VAP protocol (if indicated): Yes DVT prophylaxis: Was on Eliquis, holding anticoagulation postop, PAS hose GI prophylaxis: PPI Glucose control: CBGs with SSI Mobility: Bedrest Disposition: ICU  Goals of Care:  Last date of multidisciplinary goals of care discussion: Family and staff present:  Summary of discussion:  Follow up goals of care discussion due:  Code Status: Full   Signature:   The patient is critically ill with multiple organ system failure and requires high complexity decision making for assessment and support, frequent evaluation and titration of therapies, advanced monitoring, review of radiographic studies and interpretation of complex data.   Critical Care Time devoted to patient care services, exclusive of separately billable procedures, described in this  note is 45 minutes.   Marshell Garfinkel MD [facility] See Amion for pager  If no response to pager , please call [phone] until 7pm After 7:00 pm call Elink  [phone] 01/01/2021, 4:45 PM

## 2021-01-01 NOTE — Progress Notes (Signed)
OT Cancellation Note  Patient Details Name: April [name] MRN: [id number] DOB: [birth year]   Cancelled Treatment:    Reason Eval/Treat Not Completed: Pain limiting ability to participate. Patient declining getting out of bed today reporting her stomach is "Burning." Will f/u tomorrow.  Rayburn Mundis L Gerline Ratto 01/01/2021, 3:57 PM

## 2021-01-02 DIAGNOSIS — K5792 Diverticulitis of intestine, part unspecified, without perforation or abscess without bleeding: Secondary | ICD-10-CM | POA: Diagnosis not present

## 2021-01-02 DIAGNOSIS — J9601 Acute respiratory failure with hypoxia: Secondary | ICD-10-CM | POA: Diagnosis not present

## 2021-01-02 DIAGNOSIS — I4892 Unspecified atrial flutter: Secondary | ICD-10-CM | POA: Diagnosis not present

## 2021-01-02 DIAGNOSIS — I5031 Acute diastolic (congestive) heart failure: Secondary | ICD-10-CM | POA: Diagnosis not present

## 2021-01-02 LAB — COMPREHENSIVE METABOLIC PANEL
ALT: 11 U/L (ref 0–44)
AST: 12 U/L — ABNORMAL LOW (ref 15–41)
Albumin: 2.3 g/dL — ABNORMAL LOW (ref 3.5–5.0)
Alkaline Phosphatase: 48 U/L (ref 38–126)
Anion gap: 11 (ref 5–15)
BUN: 64 mg/dL — ABNORMAL HIGH (ref 8–23)
CO2: 24 mmol/L (ref 22–32)
Calcium: 9.6 mg/dL (ref 8.9–10.3)
Chloride: 102 mmol/L (ref 98–111)
Creatinine, Ser: 1.37 mg/dL — ABNORMAL HIGH (ref 0.44–1.00)
GFR, Estimated: 43 mL/min — ABNORMAL LOW (ref >60)
Glucose, Bld: 165 mg/dL — ABNORMAL HIGH (ref 70–99)
Potassium: 4.7 mmol/L (ref 3.5–5.1)
Sodium: 137 mmol/L (ref 135–145)
Total Bilirubin: 1.6 mg/dL — ABNORMAL HIGH (ref 0.3–1.2)
Total Protein: 5.4 g/dL — ABNORMAL LOW (ref 6.5–8.1)

## 2021-01-02 LAB — CBC WITH DIFFERENTIAL/PLATELET
Abs Immature Granulocytes: 0.17 10*3/uL — ABNORMAL HIGH (ref 0.00–0.07)
Basophils Absolute: 0 10*3/uL (ref 0.0–0.1)
Basophils Relative: 0 %
Eosinophils Absolute: 0 10*3/uL (ref 0.0–0.5)
Eosinophils Relative: 0 %
HCT: 27.9 % — ABNORMAL LOW (ref 36.0–46.0)
Hemoglobin: 8.9 g/dL — ABNORMAL LOW (ref 12.0–15.0)
Immature Granulocytes: 1 %
Lymphocytes Relative: 3 %
Lymphs Abs: 0.5 10*3/uL — ABNORMAL LOW (ref 0.7–4.0)
MCH: 33 pg (ref 26.0–34.0)
MCHC: 31.9 g/dL (ref 30.0–36.0)
MCV: 103.3 fL — ABNORMAL HIGH (ref 80.0–100.0)
Monocytes Absolute: 0.7 10*3/uL (ref 0.1–1.0)
Monocytes Relative: 4 %
Neutro Abs: 17.8 10*3/uL — ABNORMAL HIGH (ref 1.7–7.7)
Neutrophils Relative %: 92 %
Platelets: 121 10*3/uL — ABNORMAL LOW (ref 150–400)
RBC: 2.7 MIL/uL — ABNORMAL LOW (ref 3.87–5.11)
RDW: 20 % — ABNORMAL HIGH (ref 11.5–15.5)
WBC: 19.3 10*3/uL — ABNORMAL HIGH (ref 4.0–10.5)
nRBC: 0 % (ref 0.0–0.2)

## 2021-01-02 LAB — BPAM RBC
Blood Product Expiration Date: 202202252359
Blood Product Expiration Date: 202203032359
ISSUE DATE / TIME: 202202071534
ISSUE DATE / TIME: 202202090612
Unit Type and Rh: 600
Unit Type and Rh: 600

## 2021-01-02 LAB — GLUCOSE, CAPILLARY
Glucose-Capillary: 136 mg/dL — ABNORMAL HIGH (ref 70–99)
Glucose-Capillary: 157 mg/dL — ABNORMAL HIGH (ref 70–99)
Glucose-Capillary: 185 mg/dL — ABNORMAL HIGH (ref 70–99)
Glucose-Capillary: 215 mg/dL — ABNORMAL HIGH (ref 70–99)
Glucose-Capillary: 70 mg/dL (ref 70–99)
Glucose-Capillary: 87 mg/dL (ref 70–99)

## 2021-01-02 LAB — RENAL FUNCTION PANEL
Albumin: 2.3 g/dL — ABNORMAL LOW (ref 3.5–5.0)
Anion gap: 9 (ref 5–15)
BUN: 62 mg/dL — ABNORMAL HIGH (ref 8–23)
CO2: 25 mmol/L (ref 22–32)
Calcium: 9.4 mg/dL (ref 8.9–10.3)
Chloride: 101 mmol/L (ref 98–111)
Creatinine, Ser: 1.32 mg/dL — ABNORMAL HIGH (ref 0.44–1.00)
GFR, Estimated: 45 mL/min — ABNORMAL LOW (ref >60)
Glucose, Bld: 166 mg/dL — ABNORMAL HIGH (ref 70–99)
Phosphorus: 4.4 mg/dL (ref 2.5–4.6)
Potassium: 4.7 mmol/L (ref 3.5–5.1)
Sodium: 135 mmol/L (ref 135–145)

## 2021-01-02 LAB — TYPE AND SCREEN
ABO/RH(D): A NEG
Antibody Screen: NEGATIVE
Unit division: 0
Unit division: 0

## 2021-01-02 LAB — PHOSPHORUS: Phosphorus: 4.3 mg/dL (ref 2.5–4.6)

## 2021-01-02 LAB — MAGNESIUM: Magnesium: 2.1 mg/dL (ref 1.7–2.4)

## 2021-01-02 MED ORDER — PROMETHAZINE HCL 25 MG/ML IJ SOLN
12.5000 mg | Freq: Three times a day (TID) | INTRAMUSCULAR | Status: DC | PRN
  Administered 2021-01-02 – 2021-01-04 (×2): 12.5 mg via INTRAVENOUS
  Filled 2021-01-02 (×2): qty 1

## 2021-01-02 MED ORDER — SODIUM CHLORIDE 0.9 % IV SOLN
INTRAVENOUS | Status: DC | PRN
  Administered 2021-01-02 (×2): 250 mL via INTRAVENOUS

## 2021-01-02 MED ORDER — HYDRALAZINE HCL 20 MG/ML IJ SOLN
10.0000 mg | INTRAMUSCULAR | Status: DC | PRN
  Administered 2021-01-02: 10 mg via INTRAVENOUS
  Filled 2021-01-02: qty 1

## 2021-01-02 NOTE — Evaluation (Signed)
Physical Therapy Evaluation Patient Details Name: April [name] MRN: [id number] DOB: 05/31/1956 Today's Date: 01/02/2021   History of Present Illness  April [name] is a 65 y.o. 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 Admitted to the hospital started on antibiotics developed atrial fibrillation with RVR.  Had a successful TEE with [facility] cardioversion.  On the morning of 12/29/2020 patient developed acute abdominal pain had CT imaging that was concerning for free air within the belly and a perforated diverticulitis s/p Hartman's and intubation on 2/6. Extubated on 2/8.  Clinical Impression  Patient did tolerate mobilizing to sitting on bed edge requiring 2 person mod/max assistance. Patient's VSS, on 1 L [place]. Patient may require post acute rehab due to  Weakness and extended time in hospital. Patient very much wants to go home.  Pt admitted with above diagnosis.   Pt currently with functional limitations due to the deficits listed below (see PT Problem List). Pt will benefit from skilled PT to increase their independence and safety with mobility to allow discharge to the venue listed below.      Follow Up Recommendations SNF;Home health PT    Equipment Recommendations  None recommended by PT    Recommendations for Other Services       Precautions / Restrictions Precautions Precaution Comments: hx of falling, hip surgery, abdomen      Mobility  Bed Mobility Overal bed mobility: Needs Assistance Bed Mobility: Supine to Sit;Sit to Supine     Supine to sit: Max assist;HOB elevated;+2 for physical assistance;+2 for safety/equipment Sit to supine: Max assist;+2 for physical assistance;+2 for safety/equipment   General bed mobility comments: patient able to move legs to bed edge, assisted with trunk and to scoot. 2 assist to return to  side then onto  back, cues for abdominal precautions    Transfers                 General transfer comment: Refused standing today.  Ambulation/Gait                Stairs            Wheelchair Mobility    Modified Rankin (Stroke Patients Only)       Balance Overall balance assessment: Needs assistance Sitting-balance support: Feet unsupported;Single extremity supported Sitting balance-Leahy Scale: Poor Sitting balance - Comments: needs UE support to sit edge of bed                                     Pertinent Vitals/Pain Faces Pain Scale: Hurts little more Pain Location: colostomy site Pain Descriptors / Indicators: Aching Pain Intervention(s): Monitored during session;Limited activity within patient's tolerance    Home Living Family/patient expects to be discharged to:: Private residence Living Arrangements: Spouse/significant other Available Help at Discharge: Family;Available 24 hours/day;Friend(s) Type of Home: House Home Access: Ramped entrance     Home Layout: Two level;Able to live on main level with bedroom/bathroom Home Equipment: Gilford Rile - 2 wheels;Wheelchair - [employer];Shower seat;Adaptive equipment;Hospital bed;Tub bench;Hand held shower head;Transport chair Additional Comments: At home she lives with family, daughter is an OT and lives nearby, has lots of equipment    Prior Function Level of Independence: Needs assistance   Gait / Transfers Assistance Needed: AMb with RW with  ADL's /  Homemaking Assistance Needed: Able to do UB dressing, assist needed with LB dressing, or often just wears gowns. Reports difficulty with shoes due to frequent swelling.        Hand Dominance   Dominant Hand: Right    Extremity/Trunk Assessment   Upper Extremity Assessment Upper Extremity Assessment: Generalized weakness    Lower Extremity Assessment Lower Extremity Assessment: Generalized weakness    Cervical  / Trunk Assessment Cervical / Trunk Assessment: Kyphotic  Communication   Communication: No difficulties  Cognition Arousal/Alertness: Awake/alert Behavior During Therapy: WFL for tasks assessed/performed Overall Cognitive Status: Within Functional Limits for tasks assessed                                        General Comments      Exercises     Assessment/Plan    PT Assessment Patient needs continued PT services  PT Problem List Decreased strength;Decreased mobility;Decreased safety awareness;Decreased knowledge of precautions;Decreased activity tolerance;Decreased balance       PT Treatment Interventions DME instruction;Therapeutic activities;Cognitive remediation;Gait training;Therapeutic exercise;Patient/family education;Functional mobility training    PT Goals (Current goals can be found in the Care Plan section)  Acute Rehab PT Goals Patient Stated Goal: To go home PT Goal Formulation: With patient Time For Goal Achievement: 01/16/21 Potential to Achieve Goals: Fair    Frequency Min 3X/week   Barriers to discharge        Co-evaluation               AM-PAC PT "6 Clicks" Mobility  Outcome Measure Help needed turning from your back to your side while in a flat bed without using bedrails?: A Lot Help needed moving from lying on your back to sitting on the side of a flat bed without using bedrails?: A Lot Help needed moving to and from a bed to a chair (including a wheelchair)?: Total Help needed standing up from a chair using your arms (e.g., wheelchair or bedside chair)?: Total Help needed to walk in hospital room?: Total Help needed climbing 3-5 steps with a railing? : Total 6 Click Score: 8    End of Session Equipment Utilized During Treatment: Oxygen Activity Tolerance: Patient limited by fatigue;No increased pain Patient left: in bed;with call bell/phone within reach Nurse Communication: Mobility status PT Visit Diagnosis: Muscle  weakness (generalized) (M62.81);Pain    Time: 1050-1120 PT Time Calculation (min) (ACUTE ONLY): 30 min   Charges:   PT Evaluation $PT Eval Low Complexity: [place] PT Acute Rehabilitation Services Pager [phone] Office [phone]   Claretha Cooper 01/02/2021, 4:41 PM

## 2021-01-02 NOTE — Progress Notes (Addendum)
Progress Note  Patient Name: April [name] Date of Encounter: 01/02/2021  Primary Cardiologist: Sanda Klein, MD   Subjective   S/p extubation 02/08. Home O2 was prn only pta. Breathing better now.  Inpatient Medications    Scheduled Meds: . sodium chloride   Intravenous Once  . sodium chloride   Intravenous Once  . brimonidine  1 drop Both Eyes BID  . Chlorhexidine Gluconate Cloth  6 each Topical Daily  . cycloSPORINE  1 drop Both Eyes BID  . dorzolamide  1 drop Both Eyes Daily  . insulin aspart  0-15 Units Subcutaneous Q4H  . latanoprost  1 drop Both Eyes QHS  . mouth rinse  15 mL Mouth Rinse BID  . methylPREDNISolone (SOLU-MEDROL) injection  10 mg Intravenous Daily  . mycophenolate  180 mg Oral BID  . pantoprazole (PROTONIX) IV  40 mg Intravenous Daily  . tacrolimus  1 mg Oral BID  . valACYclovir  500 mg Oral BID  . cyanocobalamin  1,000 mcg Oral Daily   Continuous Infusions: . norepinephrine (LEVOPHED) Adult infusion 0 mcg/min (12/31/20 0750)  . piperacillin-tazobactam (ZOSYN)  IV 3.375 g (01/02/21 0627)   PRN Meds: acetaminophen, fentaNYL (SUBLIMAZE) injection, hydrALAZINE, lip balm, naLOXone (NARCAN)  injection, [DISCONTINUED] ondansetron **OR** ondansetron (ZOFRAN) IV, oxyCODONE   Vital Signs    Vitals:   01/02/21 0000 01/02/21 0100 01/02/21 0200 01/02/21 0300  BP: (!) 145/54 (!) 147/66 (!) 161/62 (!) 183/57  Pulse: 90 91 92 91  Resp: (!) 7 (!) 9 11 11   Temp: 98.4 F (36.9 C)     TempSrc: Axillary     SpO2: 91% 98% 98% 98%  Weight:      Height:        Intake/Output Summary (Last 24 hours) at 01/02/2021 0272 Last data filed at 01/01/2021 2200 Gross per 24 hour  Intake 662.36 ml  Output 500 ml  Net 162.36 ml   Filed Weights   12/27/20 0515 12/28/20 0500 12/29/20 0500  Weight: 78.4 kg 77 kg 77.2 kg    Physical Exam   GEN: Ill-appearing female, No acute distress.   Neck: No JVD seen, difficult to assess Cardiac: RRR, no murmur, no rubs,  or gallops.  Respiratory: diminished to auscultation bilaterally with rales GI: Soft, + tender, non-distended  MS: 2+ pedal edema; No deformity. Neuro:  Nonfocal  Psych: Normal affect   Labs    Chemistry Recent Labs  Lab 12/31/20 0530 01/01/21 0457 01/02/21 0256  NA 139 138 137  135  K 4.2 4.9 4.7  4.7  CL 104 104 102  101  CO2 25 25 24  25   GLUCOSE 96 104* 165*  166*  BUN 76* 69* 64*  62*  CREATININE 1.41* 1.43* 1.37*  1.32*  CALCIUM 9.6 9.4 9.6  9.4  PROT 4.8* 5.0* 5.4*  ALBUMIN 2.4* 2.3* 2.3*  2.3*  AST 11* 12* 12*  ALT 11 11 11   ALKPHOS 45 43 48  BILITOT 2.6* 1.8* 1.6*  GFRNONAA 42* 41* 43*  45*  ANIONGAP 10 9 11  9      Hematology Recent Labs  Lab 12/31/20 0530 01/01/21 0457 01/01/21 1204 01/02/21 0256  WBC 11.6* 15.3*  --  19.3*  RBC 2.37* 2.07*  --  2.70*  HGB 7.6* 6.7* 8.1* 8.9*  HCT 24.0* 22.1* 25.3* 27.9*  MCV 101.3* 106.8*  --  103.3*  MCH 32.1 32.4  --  33.0  MCHC 31.7 30.3  --  31.9  RDW 20.5*  20.8*  --  20.0*  PLT 104* 109*  --  121*    Cardiac EnzymesNo results for input(s): TROPONINI in the last 168 hours. No results for input(s): TROPIPOC in the last 168 hours.   BNPNo results for input(s): BNP, PROBNP in the last 168 hours.   DDimer No results for input(s): DDIMER in the last 168 hours.   Radiology    DG CHEST PORT 1 VIEW  Result Date: 01/01/2021 CLINICAL DATA:  65 year old female with respiratory failure. Renal transplant. Recent suspected bowel perforation on CT Abdomen and Pelvis. EXAM: PORTABLE CHEST 1 VIEW COMPARISON:  Portable chest 12/31/2020 and earlier. FINDINGS: Portable AP semi upright view at 0453 hours. Extubated. Enteric tube removed. Stable left IJ central line. The patient is slightly more rotated to the right. Stable cardiac size and mediastinal contours. Veiling bilateral pulmonary opacity, suspicious for increased pleural effusions since the CT on 12/29/2020. Associated dense lung base opacity. No pneumothorax.  Upper lung pulmonary vascularity appears normal. Paucity of bowel gas in the upper abdomen. IMPRESSION: 1. Extubated and enteric tube removed. Stable left IJ central line. 2. Veiling and confluent bilateral lung base opacity suspicious for increased pleural effusions and lower lobe collapse or consolidation since the abdomen CT on 12/29/2020. 3. No overt pulmonary edema. Electronically Signed   By: Genevie Ann M.D.   On: 01/01/2021 08:02   Telemetry    01/01/21 NSR with rates in the 80-90's- Personally Reviewed  ECG    No new tracing as of 01/01/21- Personally Reviewed  Cardiac Studies   ECHO 12/17/20  IMPRESSIONS   1. Left ventricular ejection fraction, by estimation, is 70 to 75%. The  left ventricle has hyperdynamic function. The left ventricle has no  regional wall motion abnormalities. Left ventricular diastolic parameters  are consistent with Grade II diastolic  dysfunction (pseudonormalization). Elevated left atrial pressure.  2. Right ventricular systolic function is normal. The right ventricular  size is normal. There is normal pulmonary artery systolic pressure.  3. The mitral valve is normal in structure. No evidence of mitral valve  regurgitation. No evidence of mitral stenosis.  4. The aortic valve is tricuspid. Aortic valve regurgitation is not  visualized. No aortic stenosis is present.  5. The inferior vena cava is normal in size with greater than 50%  respiratory variability, suggesting right atrial pressure of 3 mmHg.   Patient Profile     65 y.o. female with a hx of HTN, GERD, COPD, MGUS, HFpEF,SVT 01/2020,ESRD with renal transplant 2013 on immunosuppressants followed at [facility] and admitted 1.24.22 with diverticulitis after failing outpt therapy. Initially followed by cardiology for hypotension, however now with new onset atrial flutter with RVR.   Assessment & Plan    1. Atrial flutter with RVR: - new dx, s/p TEE/DCCV -Maintaining sinus rhythm -Apixaban DC'd  2/6 for her abdominal surgery -Apixaban has not been restarted due to wound oozing, restart when okay with surgery  2. Septic shock secondary to perforated sigmoid diverticulitis: -S/p Hartman's procedure, extubated and off pressors as of 12/31/2020 -Antibiotics and management per Surgery/IM teams  3. Acute on chronic diastolic CHF: -I/O net +3.6 L -Admit weight 165, peak 174, now 170 pounds -Breathing close to baseline -Nephrology following, will leave volume management to them  4. Acute on chronic CKD with hx of renal transplant: -Nephrology following -Creatinine 1.37 today  Otherwise, per IM Signed, Rosaria Ferries, PA-C 01/02/2021 6:52 AM  For questions or updates, please contact   Please consult [email or web address] for contact info  under Cardiology/STEMI.  Patient seen and examined.  Agree with above documentation.  On exam, patient is alert and oriented, regular rate and rhythm, no murmurs, lungs CTAB, BLE edema.  Telemetry reviewed, she remains in sinus rhythm.  Per surgery, can restart heparin drip tomorrow if hemoglobin stable.  Donato Heinz, MD

## 2021-01-02 NOTE — Progress Notes (Signed)
[place] Progress Note Patient Name: April [name] DOB: [birth year] MRN: [id number]   Date of Service  01/02/2021  HPI/Events of Note  Hypertension - BP = 183/57.  eICU Interventions  Plan: 1. Hydralazine 10 mg IV Q 4 hours PRN SBP > 170 or DBP > 100.     Intervention Category Major Interventions: Hypertension - evaluation and management  [name] 01/02/2021, 3:27 AM

## 2021-01-02 NOTE — Progress Notes (Signed)
Called provider re: pt hands and feet blue. They were cold to touch and needed to doppler pulses.  Pt was alert and oriented x4. Moving and feeling all extremities equally. Facial symmetry intact.  Per X.Blount NP continue to monitor patient. No new orders at this time

## 2021-01-02 NOTE — Evaluation (Signed)
Occupational Therapy Evaluation Patient Details Name: April [name] MRN: [id number] DOB: 1956-01-12 Today's Date: 01/02/2021    History of Present Illness April [name] is a 65 y.o. 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 Admitted to the hospital started on antibiotics developed atrial fibrillation with RVR.  Had a successful TEE with [facility] cardioversion.  On the morning of 12/29/2020 patient developed acute abdominal pain had CT imaging that was concerning for free air within the belly and a perforated diverticulitis s/p Hartman's and intubation on 2/6. Extubated on 2/8.   Clinical Impression   [name] is a 65 year old woman who presents with generalized weakness, decreased activity tolerance, impaired balance, edema and complaints of pain resulting in a significant decline in functional abilities. On evaluation patient required max x 2 to perform bed mobility and transfers. Patient refused to stand today due to complaints of abdominal pain. ADLs limited to bed level at this time - patient requiring total assist for toileting and LB dressing, max assist for LB bathing. Patient will benefit from skilled OT services while in hospital to improve deficits and learn compensatory strategies as needed in order to return to PLOF.      Follow Up Recommendations  SNF    Equipment Recommendations  None recommended by OT    Recommendations for Other Services       Precautions / Restrictions Precautions Precautions: Fall Precaution Comments: hx of falling, hip surgery Restrictions Weight Bearing Restrictions: No      Mobility Bed Mobility Overal bed mobility: Needs Assistance Bed Mobility: Supine to Sit;Sit to Supine     Supine to sit: Max assist;HOB elevated;+2 for physical assistance;+2 for safety/equipment Sit to supine:  Max assist;+2 for physical assistance;+2 for safety/equipment        Transfers                 General transfer comment: Refused standing today.    Balance Overall balance assessment: Needs assistance Sitting-balance support: Feet unsupported;Single extremity supported Sitting balance-Leahy Scale: Poor Sitting balance - Comments: needs UE support to sit edge of bed                                   ADL either performed or assessed with clinical judgement   ADL Overall ADL's : Needs assistance/impaired Eating/Feeding: Set up;Bed level   Grooming: Set up;Bed level   Upper Body Bathing: Set up;Bed level   Lower Body Bathing: Moderate assistance;Bed level   Upper Body Dressing : Moderate assistance;Bed level   Lower Body Dressing: Bed level;Total assistance     Toilet Transfer Details (indicate cue type and reason): unable Toileting- Clothing Manipulation and Hygiene: Total assistance               Vision Patient Visual Report: No change from baseline       Perception     Praxis      Pertinent Vitals/Pain Pain Assessment: Faces Faces Pain Scale: Hurts little more Pain Location: colostomy site Pain Descriptors / Indicators: Aching Pain Intervention(s): Limited activity within patient's tolerance;Monitored during session     Hand Dominance Right   Extremity/Trunk Assessment Upper Extremity Assessment Upper Extremity Assessment: Generalized weakness   Lower Extremity Assessment Lower Extremity Assessment: Defer to PT evaluation   Cervical / Trunk  Assessment Cervical / Trunk Assessment: Kyphotic   Communication Communication Communication: No difficulties   Cognition Arousal/Alertness: Awake/alert Behavior During Therapy: WFL for tasks assessed/performed Overall Cognitive Status: Within Functional Limits for tasks assessed                                     General Comments       Exercises Other  Exercises Other Exercises: educated patient to perform shoulder flexion/reaching in supine, heel slides, ankle pumps, and generalized fidgeting while in bed to promote ROm, strength and reduction of edema   Shoulder Instructions      Home Living Family/patient expects to be discharged to:: Private residence Living Arrangements: Spouse/significant other Available Help at Discharge: Family;Available 24 hours/day;Friend(s) Type of Home: House Home Access: Ramped entrance     Home Layout: Two level;Able to live on main level with bedroom/bathroom Alternate Level Stairs-Number of Steps: 12   Bathroom Shower/Tub: Teacher, early years/pre: Standard     Home Equipment: Environmental consultant - 2 wheels;Wheelchair - [employer];Shower seat;Adaptive equipment;Hospital bed;Tub bench;Hand held shower head;Transport chair Adaptive Equipment: Reacher;Sock aid;Long-handled shoe horn Additional Comments: At home she lives with family, daughter is an OT and lives nearby, has lots of equipment      Prior Functioning/Environment Level of Independence: Needs assistance  Gait / Transfers Assistance Needed: AMb with RW with ADL's / Homemaking Assistance Needed: Able to do UB dressing, assist needed with LB dressing, or often just wears gowns. Reports difficulty with shoes due to frequent swelling.            OT Problem List: Decreased strength;Decreased range of motion;Decreased activity tolerance;Impaired balance (sitting and/or standing);Decreased knowledge of use of DME or AE;Decreased knowledge of precautions;Pain;Increased edema;Obesity      OT Treatment/Interventions: Self-care/ADL training;Therapeutic exercise;DME and/or AE instruction;Therapeutic activities;Patient/family education;Balance training    OT Goals(Current goals can be found in the care plan section) Acute Rehab OT Goals Patient Stated Goal: To go home OT Goal Formulation: With patient Time For Goal Achievement:  01/16/21 Potential to Achieve Goals: Fair  OT Frequency: Min 2X/week   Barriers to D/C:            Co-evaluation              AM-PAC OT "6 Clicks" Daily Activity     Outcome Measure Help from another person eating meals?: A Little Help from another person taking care of personal grooming?: A Little Help from another person toileting, which includes using toliet, bedpan, or urinal?: Total Help from another person bathing (including washing, rinsing, drying)?: A Lot Help from another person to put on and taking off regular upper body clothing?: A Lot Help from another person to put on and taking off regular lower body clothing?: Total 6 Click Score: 12   End of Session Nurse Communication: Mobility status  Activity Tolerance: Patient limited by fatigue;Patient limited by pain Patient left: in bed;with call bell/phone within reach  OT Visit Diagnosis: Other abnormalities of gait and mobility (R26.89);Muscle weakness (generalized) (M62.81);History of falling (Z91.81);Pain                Time: 5093-2671 OT Time Calculation (min): 26 min Charges:  OT General Charges $OT Visit: 1 Visit OT Evaluation $OT Eval Moderate Complexity: 1 Mod  Caeli Linehan, OTR/L [place]  Office [phone] Pager: [place] 01/02/2021, 12:20 PM

## 2021-01-02 NOTE — Progress Notes (Signed)
4 Days Post-Op  Subjective: She remains extubated and reports she is feeling pretty well.  She has began to have ostomy function - stool and flatus.  No oozing from wound anymore  ROS: See above, otherwise other systems negative  Objective: Vital signs in last 24 hours: Temp:  [97.2 F (36.2 C)-99.3 F (37.4 C)] 97.9 F (36.6 C) (02/10 0800) Pulse Rate:  [89-209] 209 (02/10 0800) Resp:  [7-25] 21 (02/10 0800) BP: (125-183)/(33-104) 157/64 (02/10 0800) SpO2:  [91 %-100 %] 95 % (02/10 0800) Arterial Line BP: (135-164)/(55-65) 154/57 (02/09 1549) Last BM Date: 01/01/21  Intake/Output from previous day: 02/09 0701 - 02/10 0700 In: 471.2 [Blood:346; IV Piggyback:125.2] Out: 975 [Urine:975] Intake/Output this shift: No intake/output data recorded.  PE: Abd: soft, appropriately tender, +BS.  Stoma is pink and viable.  Midline clean but still oozing from both skin edges.   Lab Results:  Recent Labs    01/01/21 0457 01/01/21 1204 01/02/21 0256  WBC 15.3*  --  19.3*  HGB 6.7* 8.1* 8.9*  HCT 22.1* 25.3* 27.9*  PLT 109*  --  121*   BMET Recent Labs    01/01/21 0457 01/02/21 0256  NA 138 137  135  K 4.9 4.7  4.7  CL 104 102  101  CO2 25 24  25   GLUCOSE 104* 165*  166*  BUN 69* 64*  62*  CREATININE 1.43* 1.37*  1.32*  CALCIUM 9.4 9.6  9.4   PT/INR No results for input(s): LABPROT, INR in the last 72 hours. CMP     Component Value Date/Time   NA 135 01/02/2021 0256   NA 137 01/02/2021 0256   K 4.7 01/02/2021 0256   K 4.7 01/02/2021 0256   CL 101 01/02/2021 0256   CL 102 01/02/2021 0256   CO2 25 01/02/2021 0256   CO2 24 01/02/2021 0256   GLUCOSE 166 (H) 01/02/2021 0256   GLUCOSE 165 (H) 01/02/2021 0256   BUN 62 (H) 01/02/2021 0256   BUN 64 (H) 01/02/2021 0256   CREATININE 1.32 (H) 01/02/2021 0256   CREATININE 1.37 (H) 01/02/2021 0256   CREATININE 1.28 (H) 11/04/2020 0952   CALCIUM 9.4 01/02/2021 0256   CALCIUM 9.6 01/02/2021 0256   CALCIUM  10.8 (H) 10/13/2011 1405   PROT 5.4 (L) 01/02/2021 0256   ALBUMIN 2.3 (L) 01/02/2021 0256   ALBUMIN 2.3 (L) 01/02/2021 0256   AST 12 (L) 01/02/2021 0256   AST 13 (L) 11/04/2020 0952   ALT 11 01/02/2021 0256   ALT 12 11/04/2020 0952   ALKPHOS 48 01/02/2021 0256   BILITOT 1.6 (H) 01/02/2021 0256   BILITOT 0.6 11/04/2020 0952   GFRNONAA 45 (L) 01/02/2021 0256   GFRNONAA 43 (L) 01/02/2021 0256   GFRNONAA 47 (L) 11/04/2020 0952   GFRAA 51 (L) 08/05/2020 1523   Lipase     Component Value Date/Time   LIPASE 49 12/29/2020 0822       Studies/Results: DG CHEST PORT 1 VIEW  Result Date: 01/01/2021 CLINICAL DATA:  65 year old female with respiratory failure. Renal transplant. Recent suspected bowel perforation on CT Abdomen and Pelvis. EXAM: PORTABLE CHEST 1 VIEW COMPARISON:  Portable chest 12/31/2020 and earlier. FINDINGS: Portable AP semi upright view at 0453 hours. Extubated. Enteric tube removed. Stable left IJ central line. The patient is slightly more rotated to the right. Stable cardiac size and mediastinal contours. Veiling bilateral pulmonary opacity, suspicious for increased pleural effusions since the CT on 12/29/2020. Associated dense lung base opacity.  No pneumothorax. Upper lung pulmonary vascularity appears normal. Paucity of bowel gas in the upper abdomen. IMPRESSION: 1. Extubated and enteric tube removed. Stable left IJ central line. 2. Veiling and confluent bilateral lung base opacity suspicious for increased pleural effusions and lower lobe collapse or consolidation since the abdomen CT on 12/29/2020. 3. No overt pulmonary edema. Electronically Signed   By: Genevie Ann M.D.   On: 01/01/2021 08:02    Anti-infectives: Anti-infectives (From admission, onward)   Start     Dose/Rate Route Frequency Ordered Stop   12/31/20 2200  valACYclovir (VALTREX) tablet 500 mg        500 mg Oral 2 times daily 12/31/20 2100     12/31/20 1000  valACYclovir (VALTREX) compounded oral suspension 500  mg  Status:  Discontinued        500 mg Per Tube 2 times daily 12/30/20 2051 12/31/20 2100   12/29/20 1045  piperacillin-tazobactam (ZOSYN) IVPB 3.375 g        3.375 g 12.5 mL/hr over 240 Minutes Intravenous Every 8 hours 12/29/20 0956     12/24/20 0800  ciprofloxacin (CIPRO) tablet 500 mg  Status:  Discontinued        500 mg Oral Daily with breakfast 12/23/20 1721 12/23/20 1737   12/23/20 2200  metroNIDAZOLE (FLAGYL) tablet 500 mg  Status:  Discontinued        500 mg Oral Every 8 hours 12/23/20 1721 12/23/20 1737   12/23/20 2200  amoxicillin-clavulanate (AUGMENTIN) 500-125 MG per tablet 500 mg  Status:  Discontinued        1 tablet Oral 2 times daily 12/23/20 1739 12/29/20 0949   12/18/20 1100  Ampicillin-Sulbactam (UNASYN) 3 g in sodium chloride 0.9 % 100 mL IVPB  Status:  Discontinued        3 g 200 mL/hr over 30 Minutes Intravenous Every 12 hours 12/18/20 0729 12/23/20 1721   12/17/20 1400  Ampicillin-Sulbactam (UNASYN) 3 g in sodium chloride 0.9 % 100 mL IVPB  Status:  Discontinued        3 g 200 mL/hr over 30 Minutes Intravenous Every 6 hours 12/17/20 1004 12/18/20 0729   12/16/20 2200  ciprofloxacin (CIPRO) IVPB 400 mg  Status:  Discontinued        400 mg 200 mL/hr over 60 Minutes Intravenous Every 12 hours 12/16/20 1451 12/16/20 1734   12/16/20 2200  piperacillin-tazobactam (ZOSYN) IVPB 3.375 g  Status:  Discontinued        3.375 g 12.5 mL/hr over 240 Minutes Intravenous Every 8 hours 12/16/20 1734 12/17/20 0949   12/16/20 1800  metroNIDAZOLE (FLAGYL) IVPB 500 mg  Status:  Discontinued        500 mg 100 mL/hr over 60 Minutes Intravenous Every 8 hours 12/16/20 1451 12/16/20 1734   12/16/20 1515  valACYclovir (VALTREX) tablet 500 mg  Status:  Discontinued        500 mg Oral 2 times daily 12/16/20 1505 12/30/20 2048   12/16/20 0915  ciprofloxacin (CIPRO) IVPB 400 mg        400 mg 200 mL/hr over 60 Minutes Intravenous  Once 12/16/20 0912 12/16/20 1104   12/16/20 0915  metroNIDAZOLE  (FLAGYL) IVPB 500 mg        500 mg 100 mL/hr over 60 Minutes Intravenous  Once 12/16/20 0912 12/16/20 1104       Assessment/Plan Polycystic disease with renal transplant 2013-Prograf/prednisone. Renalfollowing Elevated Cr CHF/uncertain etiology -Cards following Chronic COPD/history of tobacco use- On 2L PRN at home  Pleuritic chest pain and SOB- VQ 1/25:Low probability pulmonary embolus. Age indeterminate, non occlusive deep vein thrombosis involving the right popliteal vein Hx SVT Atrial flutter with RVR - on eliquis, which is currently on hold.  Likely reason for elevated INR of 1.8 yesterday and some oozing Hyponatremia - Per medicine -  POD 4, s/p Hartman's for Perforated sigmoid diverticulitis by Dr. Ninfa Linden on 2/6 -no nausea; gently advance as tolerated - ok for full liquids today -if hgb remains stable tomorrow, ok to restart anticoagulation -mobilize, PT eval -IS/pulm toilet   FEN:FLD/IVFs YF:[id number] 1/24;Zosyn1/24-1/25; Unasyn 1/25; zosyn 2/6 --> POL:IDCV due to persistent wound oozing  LOS: 17 days   Nadeen Landau, MD [facility] Use [email or web address] to contact on call provider

## 2021-01-02 NOTE — Progress Notes (Signed)
PROGRESS NOTE   April [name]  FVC:[id number]    DOB: May 13, 1956    DOA: 12/16/2020  PCP: [name], MD   I have briefly reviewed patients previous medical records in [facility].  Chief Complaint  Patient presents with  . Abdominal Pain    Brief Narrative:  PCCM to [facility] transfer 01/02/2021: 65 year old female with medical history of severe persistent asthma, chronic respiratory failure with hypoxia on home oxygen 2 L/min as needed, ESRD s/p deceased donor renal transplant in 2013 on immunosuppression and followed at [facility], chronic diastolic CHF, MGUS, HTN, recently seen in the ER for uncomplicated sigmoid diverticulitis, returned to the hospital with abdominal pain and fever.  She was admitted to the hospital and started on antibiotics, developed atrial fibrillation with RVR.  She underwent successful TEE with [facility] cardioversion.  On the morning of 12/29/2020, patient developed acute abdominal pain and CT imaging concerning for free air within the abdomen from perforated diverticulitis. S/p Hartman's procedure, postop remained on ventilator and vasopressors.  Stabilized and care transferred to [facility].   Assessment & Plan:  Active Problems:   GERD without esophagitis   Renal transplant recipient   Depression   Sepsis ([place])   SOB (shortness of breath)   Acute diverticulitis   Sigmoid diverticulitis   Acute CHF (congestive heart failure) (HCC)   Bilious vomiting with nausea   Acute respiratory failure with hypoxia secondary to septic shock due to perforated diverticulitis and peritonitis complicating underlying COPD, chronic respiratory failure with hypoxia: She was managed by critical care in the ICU.  Intubated, extubated 2/8 and doing well on [place] oxygen 2 L/min.  Shock resolved and weaned off vasopressors.  Mobilize with PT.  Incentive spirometry.  Patient states that she only uses oxygen as needed 2 L/min.  Saturation goal: 88-92%.  Continue IV Zosyn.  Perforated acute sigmoid  diverticulitis s/p Hartman's on 2/6 General surgery follow-up appreciated.  Advancing diet, up to full liquids today.  Continue IV Zosyn.  Suspected OSA OSA suspected based on apneic episodes at night.  Per PCCM, continue CPAP for now at night while hospitalized but eventually will need sleep study as an outpatient.  Outpatient follow-up with pulmonology/Dr. Vaughan Browner is her primary pulmonologist.  Type II DM with hyperglycemia Complicated by steroids.  Off Lantus due to hypoglycemic episodes.  Reasonable control on SSI alone, monitor.  Atrial flutter with RVR, s/p TEE and DCCV on 12/25/2020 Currently in sinus rhythm.  Apixaban was discontinued on 2/6 for her abdominal surgery.  Anticoagulation had not been resumed due to slow oozing from the wound, needed blood transfusion on 2/7 and 2/9.  As per general surgery follow-up, okay to restart anticoagulation 2/11 if hemoglobin remains stable.  Acute on chronic diastolic CHF Volume management per nephrology.  Acute kidney injury complicating CKD stage III a: Baseline creatinine 1.1-1.5.  AKI due to volume depletion from nausea and vomiting, hypotension, sepsis, third spacing and acute on chronic diastolic CHF.  Serum creatinine peaked to 2.7.  This improved and has plateaued to 1.3 range for the last 2 days.  AKI resolved.  Nephrology signed off 2/9.  Adrenal insufficiency: Low a.m. cortisol, renin/aldosterone in process.  Blood pressures normal.  Continue Solu-Medrol 10 mg IV daily for now and eventually needs to go back on home dose of prednisone 7.5 mg daily.  S/p renal transplant Per nephrology recommendations, continue Prograf/Myfortic.  Prograf level pending.  On Solu-Medrol taper, eventually to transition to home dose of prednisone 7.5 mg daily and  should not be tapered lower than that.  Postop acute blood loss anemia: S/p transfusions.  Hemoglobin up to 8 g range.  Follow CBC in a.m. and transfuse if hemoglobin 7 g or  less.  Thrombocytopenia: Improving.  Body mass index is 32.16 kg/m.  Nutritional Status Nutrition Problem: Inadequate oral intake Etiology: inability to eat Signs/Symptoms: NPO status Interventions: Refer to RD note for recommendations    DVT prophylaxis: Place and maintain sequential compression device Start: 12/31/20 1004     Code Status: Full Code Family Communication: None at bedside Disposition:  Status is: Inpatient  Remains inpatient appropriate because:Inpatient level of care appropriate due to severity of illness   Dispo:  Patient From: Home  Planned Disposition: Home with Health Care Svc  Expected discharge date: 01/03/2021  Medically stable for discharge: No          Consultants:   General surgery Cardiology Nephrology  Procedures:   12/29/2020: Exploratory laparotomy, sigmoid colectomy and end colostomy, central venous line, arterial line. 12/31/2020: Extubated. Foley catheter-to be discontinued 2/10  Significant Hospital events: 1/24 Admit 2/2 TEE guided cardioversion 2/6 Hartman's procedure for perf diverticulitis, remains on vent post op 2/8 Extubated, PRBC for low Hb   Antimicrobials:    Anti-infectives (From admission, onward)   Start     Dose/Rate Route Frequency Ordered Stop   12/31/20 2200  valACYclovir (VALTREX) tablet 500 mg        500 mg Oral 2 times daily 12/31/20 2100     12/31/20 1000  valACYclovir (VALTREX) compounded oral suspension 500 mg  Status:  Discontinued        500 mg Per Tube 2 times daily 12/30/20 2051 12/31/20 2100   12/29/20 1045  piperacillin-tazobactam (ZOSYN) IVPB 3.375 g        3.375 g 12.5 mL/hr over 240 Minutes Intravenous Every 8 hours 12/29/20 0956     12/24/20 0800  ciprofloxacin (CIPRO) tablet 500 mg  Status:  Discontinued        500 mg Oral Daily with breakfast 12/23/20 1721 12/23/20 1737   12/23/20 2200  metroNIDAZOLE (FLAGYL) tablet 500 mg  Status:  Discontinued        500 mg Oral Every 8 hours  12/23/20 1721 12/23/20 1737   12/23/20 2200  amoxicillin-clavulanate (AUGMENTIN) 500-125 MG per tablet 500 mg  Status:  Discontinued        1 tablet Oral 2 times daily 12/23/20 1739 12/29/20 0949   12/18/20 1100  Ampicillin-Sulbactam (UNASYN) 3 g in sodium chloride 0.9 % 100 mL IVPB  Status:  Discontinued        3 g 200 mL/hr over 30 Minutes Intravenous Every 12 hours 12/18/20 0729 12/23/20 1721   12/17/20 1400  Ampicillin-Sulbactam (UNASYN) 3 g in sodium chloride 0.9 % 100 mL IVPB  Status:  Discontinued        3 g 200 mL/hr over 30 Minutes Intravenous Every 6 hours 12/17/20 1004 12/18/20 0729   12/16/20 2200  ciprofloxacin (CIPRO) IVPB 400 mg  Status:  Discontinued        400 mg 200 mL/hr over 60 Minutes Intravenous Every 12 hours 12/16/20 1451 12/16/20 1734   12/16/20 2200  piperacillin-tazobactam (ZOSYN) IVPB 3.375 g  Status:  Discontinued        3.375 g 12.5 mL/hr over 240 Minutes Intravenous Every 8 hours 12/16/20 1734 12/17/20 0949   12/16/20 1800  metroNIDAZOLE (FLAGYL) IVPB 500 mg  Status:  Discontinued        500 mg  100 mL/hr over 60 Minutes Intravenous Every 8 hours 12/16/20 1451 12/16/20 1734   12/16/20 1515  valACYclovir (VALTREX) tablet 500 mg  Status:  Discontinued        500 mg Oral 2 times daily 12/16/20 1505 12/30/20 2048   12/16/20 0915  ciprofloxacin (CIPRO) IVPB 400 mg        400 mg 200 mL/hr over 60 Minutes Intravenous  Once 12/16/20 0912 12/16/20 1104   12/16/20 0915  metroNIDAZOLE (FLAGYL) IVPB 500 mg        500 mg 100 mL/hr over 60 Minutes Intravenous  Once 12/16/20 0912 12/16/20 1104        Subjective:  Patient actually denied any complaints this morning.  No pain reported.  Tolerated full breakfast.  Denied dyspnea or chest pain.  As per RN, no acute issues.  Lives with her spouse and mom, ambulates with the help of a walker, uses as needed oxygen as noted above.  Objective:   Vitals:   01/02/21 1200 01/02/21 1300 01/02/21 1400 01/02/21 1601  BP: (!)  148/65 (!) 144/63 (!) 142/69   Pulse: 96 96 (!) 103   Resp: 14 16 (!) 22   Temp: 97.7 F (36.5 C)   98.2 F (36.8 C)  TempSrc: Oral   Oral  SpO2: 94% 100% 100%   Weight:      Height:        General exam: Angelina female, moderately built and obese sitting up comfortably in bed. Respiratory system: Slightly diminished breath sounds in the bases but otherwise clear to auscultation.  No increased work of breathing. Cardiovascular system: S1 & S2 heard, RRR. No JVD, murmurs, rubs, gallops or clicks. No pedal edema.  Telemetry personally reviewed: Sinus rhythm. Gastrointestinal system: Abdomen is nondistended, soft and nontender. No organomegaly or masses felt. Normal bowel sounds heard.  Dressing over surgical site clean and dry.  Left Hartman's pouch without much drainage this morning. Central nervous system: Alert and oriented. No focal neurological deficits. Extremities: Symmetric 5 x 5 power.  Bilateral fingers with dusky appearance and cool to touch but normal bilateral radial pulses. Skin: No rashes, lesions or ulcers Psychiatry: Judgement and insight appear normal. Mood & affect appropriate.     Data Reviewed:   I have personally reviewed following labs and imaging studies   CBC: Recent Labs  Lab 12/31/20 0530 01/01/21 0457 01/01/21 1204 01/02/21 0256  WBC 11.6* 15.3*  --  19.3*  NEUTROABS 10.6* 14.5*  --  17.8*  HGB 7.6* 6.7* 8.1* 8.9*  HCT 24.0* 22.1* 25.3* 27.9*  MCV 101.3* 106.8*  --  103.3*  PLT 104* 109*  --  121*    Basic Metabolic Panel: Recent Labs  Lab 12/31/20 0530 12/31/20 1259 01/01/21 0457 01/02/21 0256  NA 139  --  138 137  135  K 4.2  --  4.9 4.7  4.7  CL 104  --  104 102  101  CO2 25  --  25 24  25   GLUCOSE 96  --  104* 165*  166*  BUN 76*  --  69* 64*  62*  CREATININE 1.41*  --  1.43* 1.37*  1.32*  CALCIUM 9.6  --  9.4 9.6  9.4  MG 2.2 2.0  --  2.1  PHOS 3.5  --  4.5 4.3  4.4    Liver Function Tests: Recent Labs  Lab  12/31/20 0530 01/01/21 0457 01/02/21 0256  AST 11* 12* 12*  ALT 11 11 11   ALKPHOS  45 43 48  BILITOT 2.6* 1.8* 1.6*  PROT 4.8* 5.0* 5.4*  ALBUMIN 2.4* 2.3* 2.3*  2.3*    CBG: Recent Labs  Lab 01/02/21 0740 01/02/21 1146 01/02/21 1538  GLUCAP 87 70 157*    Microbiology Studies:   Recent Results (from the past 240 hour(s))  MRSA PCR Screening     Status: None   Collection Time: 12/23/20  5:45 PM   Specimen: Nasal Mucosa; Nasopharyngeal  Result Value Ref Range Status   MRSA by PCR NEGATIVE NEGATIVE Final    Comment:        The GeneXpert MRSA Assay (FDA approved for NASAL specimens only), is one component of a comprehensive MRSA colonization surveillance program. It is not intended to diagnose MRSA infection nor to guide or monitor treatment for MRSA infections. Performed at [facility], [place] [address] [place],  [address]   SARS Coronavirus 2 by RT PCR (hospital order, performed in [facility] hospital lab) Nasopharyngeal Nasopharyngeal Swab     Status: None   Collection Time: 12/25/20  8:30 AM   Specimen: Nasopharyngeal Swab  Result Value Ref Range Status   SARS Coronavirus 2 NEGATIVE NEGATIVE Final    Comment: (NOTE) SARS-CoV-2 target nucleic acids are NOT DETECTED.  The SARS-CoV-2 RNA is generally detectable in upper and lower respiratory specimens during the acute phase of infection. The lowest concentration of SARS-CoV-2 viral copies this assay can detect is 250 copies / mL. A negative result does not preclude SARS-CoV-2 infection and should not be used as the sole basis for treatment or other patient management decisions.  A negative result may occur with improper specimen collection / handling, submission of specimen other than nasopharyngeal swab, presence of viral mutation(s) within the areas targeted by this assay, and inadequate number of viral copies (<250 copies / mL). A negative result must be combined with  clinical observations, patient history, and epidemiological information.  Fact Sheet for Patients:   StrictlyIdeas.no  Fact Sheet for Healthcare Providers: BankingDealers.co.za  This test is not yet approved or  cleared by the [place] FDA and has been authorized for detection and/or diagnosis of SARS-CoV-2 by FDA under an Emergency Use Authorization (EUA).  This EUA will remain in effect (meaning this test can be used) for the duration of the COVID-19 declaration under Section 564(b)(1) of the Act, 21 U.S.C. section 360bbb-3(b)(1), unless the authorization is terminated or revoked sooner.  Performed at [facility], [place] [address] [place],  [address]      Radiology Studies:  DG CHEST PORT 1 VIEW  Result Date: 01/01/2021 CLINICAL DATA:  65 year old female with respiratory failure. Renal transplant. Recent suspected bowel perforation on CT Abdomen and Pelvis. EXAM: PORTABLE CHEST 1 VIEW COMPARISON:  Portable chest 12/31/2020 and earlier. FINDINGS: Portable AP semi upright view at 0453 hours. Extubated. Enteric tube removed. Stable left IJ central line. The patient is slightly more rotated to the right. Stable cardiac size and mediastinal contours. Veiling bilateral pulmonary opacity, suspicious for increased pleural effusions since the CT on 12/29/2020. Associated dense lung base opacity. No pneumothorax. Upper lung pulmonary vascularity appears normal. Paucity of bowel gas in the upper abdomen. IMPRESSION: 1. Extubated and enteric tube removed. Stable left IJ central line. 2. Veiling and confluent bilateral lung base opacity suspicious for increased pleural effusions and lower lobe collapse or consolidation since the abdomen CT on 12/29/2020. 3. No overt pulmonary edema. Electronically Signed   By: Genevie Ann M.D.   On:  01/01/2021 08:02     Scheduled Meds:   . brimonidine  1 drop Both Eyes BID  . Chlorhexidine  Gluconate Cloth  6 each Topical Daily  . cycloSPORINE  1 drop Both Eyes BID  . dorzolamide  1 drop Both Eyes Daily  . insulin aspart  0-15 Units Subcutaneous Q4H  . latanoprost  1 drop Both Eyes QHS  . mouth rinse  15 mL Mouth Rinse BID  . methylPREDNISolone (SOLU-MEDROL) injection  10 mg Intravenous Daily  . mycophenolate  180 mg Oral BID  . pantoprazole (PROTONIX) IV  40 mg Intravenous Daily  . tacrolimus  1 mg Oral BID  . valACYclovir  500 mg Oral BID  . cyanocobalamin  1,000 mcg Oral Daily    Continuous Infusions:   . sodium chloride 250 mL (01/02/21 1400)  . norepinephrine (LEVOPHED) Adult infusion 0 mcg/min (12/31/20 0750)  . piperacillin-tazobactam (ZOSYN)  IV 3.375 g (01/02/21 1401)     LOS: 17 days     Vernell Leep, MD, [place], [facility]. [facility]    To contact the attending provider between 7A-7P or the covering provider during after hours 7P-7A, please log into the web site [email or web address] and access using universal [place] password for that web site. If you do not have the password, please call the hospital operator.  01/02/2021, 4:10 PM

## 2021-01-03 ENCOUNTER — Inpatient Hospital Stay (HOSPITAL_COMMUNITY): Payer: Medicare Other

## 2021-01-03 DIAGNOSIS — E870 Hyperosmolality and hypernatremia: Secondary | ICD-10-CM | POA: Diagnosis not present

## 2021-01-03 DIAGNOSIS — R230 Cyanosis: Secondary | ICD-10-CM | POA: Diagnosis not present

## 2021-01-03 DIAGNOSIS — I5033 Acute on chronic diastolic (congestive) heart failure: Secondary | ICD-10-CM | POA: Diagnosis not present

## 2021-01-03 DIAGNOSIS — E875 Hyperkalemia: Secondary | ICD-10-CM

## 2021-01-03 DIAGNOSIS — I4892 Unspecified atrial flutter: Secondary | ICD-10-CM | POA: Diagnosis not present

## 2021-01-03 DIAGNOSIS — N179 Acute kidney failure, unspecified: Secondary | ICD-10-CM | POA: Diagnosis not present

## 2021-01-03 DIAGNOSIS — I7300 Raynaud's syndrome without gangrene: Secondary | ICD-10-CM | POA: Diagnosis not present

## 2021-01-03 LAB — COMPREHENSIVE METABOLIC PANEL
ALT: 11 U/L (ref 0–44)
AST: 11 U/L — ABNORMAL LOW (ref 15–41)
Albumin: 2.3 g/dL — ABNORMAL LOW (ref 3.5–5.0)
Alkaline Phosphatase: 53 U/L (ref 38–126)
Anion gap: 18 — ABNORMAL HIGH (ref 5–15)
BUN: 62 mg/dL — ABNORMAL HIGH (ref 8–23)
CO2: 18 mmol/L — ABNORMAL LOW (ref 22–32)
Calcium: 9.5 mg/dL (ref 8.9–10.3)
Chloride: 110 mmol/L (ref 98–111)
Creatinine, Ser: 1.48 mg/dL — ABNORMAL HIGH (ref 0.44–1.00)
GFR, Estimated: 40 mL/min — ABNORMAL LOW (ref >60)
Glucose, Bld: 162 mg/dL — ABNORMAL HIGH (ref 70–99)
Potassium: 5.5 mmol/L — ABNORMAL HIGH (ref 3.5–5.1)
Sodium: 146 mmol/L — ABNORMAL HIGH (ref 135–145)
Total Bilirubin: 1.4 mg/dL — ABNORMAL HIGH (ref 0.3–1.2)
Total Protein: 5.6 g/dL — ABNORMAL LOW (ref 6.5–8.1)

## 2021-01-03 LAB — GLUCOSE, CAPILLARY
Glucose-Capillary: 105 mg/dL — ABNORMAL HIGH (ref 70–99)
Glucose-Capillary: 140 mg/dL — ABNORMAL HIGH (ref 70–99)
Glucose-Capillary: 160 mg/dL — ABNORMAL HIGH (ref 70–99)
Glucose-Capillary: 186 mg/dL — ABNORMAL HIGH (ref 70–99)
Glucose-Capillary: 209 mg/dL — ABNORMAL HIGH (ref 70–99)
Glucose-Capillary: 86 mg/dL (ref 70–99)

## 2021-01-03 LAB — CBC
HCT: 29 % — ABNORMAL LOW (ref 36.0–46.0)
Hemoglobin: 8.8 g/dL — ABNORMAL LOW (ref 12.0–15.0)
MCH: 32.4 pg (ref 26.0–34.0)
MCHC: 30.3 g/dL (ref 30.0–36.0)
MCV: 106.6 fL — ABNORMAL HIGH (ref 80.0–100.0)
Platelets: 96 10*3/uL — ABNORMAL LOW (ref 150–400)
RBC: 2.72 MIL/uL — ABNORMAL LOW (ref 3.87–5.11)
RDW: 19.4 % — ABNORMAL HIGH (ref 11.5–15.5)
WBC: 18.8 10*3/uL — ABNORMAL HIGH (ref 4.0–10.5)
nRBC: 0 % (ref 0.0–0.2)

## 2021-01-03 LAB — HEPARIN LEVEL (UNFRACTIONATED): Heparin Unfractionated: 1 IU/mL — ABNORMAL HIGH (ref 0.30–0.70)

## 2021-01-03 LAB — TACROLIMUS LEVEL: Tacrolimus (FK506) - LabCorp: 6 ng/mL (ref 2.0–20.0)

## 2021-01-03 MED ORDER — NEPRO/CARBSTEADY PO LIQD
237.0000 mL | Freq: Two times a day (BID) | ORAL | Status: DC
  Administered 2021-01-04 – 2021-01-07 (×4): 237 mL via ORAL
  Filled 2021-01-03 (×10): qty 237

## 2021-01-03 MED ORDER — HEPARIN (PORCINE) 25000 UT/250ML-% IV SOLN
900.0000 [IU]/h | INTRAVENOUS | Status: DC
  Administered 2021-01-03: 900 [IU]/h via INTRAVENOUS
  Filled 2021-01-03: qty 250

## 2021-01-03 MED ORDER — PROSOURCE PLUS PO LIQD
30.0000 mL | Freq: Two times a day (BID) | ORAL | Status: DC
  Administered 2021-01-04 – 2021-01-14 (×19): 30 mL via ORAL
  Filled 2021-01-03 (×16): qty 30

## 2021-01-03 MED ORDER — FREE WATER
200.0000 mL | Status: DC
  Administered 2021-01-03 – 2021-01-05 (×11): 200 mL via ORAL

## 2021-01-03 MED ORDER — SODIUM ZIRCONIUM CYCLOSILICATE 10 G PO PACK
10.0000 g | PACK | Freq: Once | ORAL | Status: AC
  Administered 2021-01-03: 10 g via ORAL
  Filled 2021-01-03: qty 1

## 2021-01-03 MED ORDER — HEPARIN (PORCINE) 25000 UT/250ML-% IV SOLN
700.0000 [IU]/h | INTRAVENOUS | Status: DC
  Administered 2021-01-04: 700 [IU]/h via INTRAVENOUS
  Filled 2021-01-03: qty 250

## 2021-01-03 MED ORDER — METOPROLOL TARTRATE 25 MG PO TABS
25.0000 mg | ORAL_TABLET | Freq: Two times a day (BID) | ORAL | Status: DC
  Administered 2021-01-03 – 2021-01-14 (×23): 25 mg via ORAL
  Filled 2021-01-03 (×23): qty 1

## 2021-01-03 MED ORDER — DEXTROSE 5 % IV SOLN: INTRAVENOUS | Status: AC

## 2021-01-03 MED ORDER — AMLODIPINE BESYLATE 5 MG PO TABS
5.0000 mg | ORAL_TABLET | Freq: Every day | ORAL | Status: DC
  Administered 2021-01-04 – 2021-01-14 (×10): 5 mg via ORAL
  Filled 2021-01-03 (×11): qty 1

## 2021-01-03 NOTE — Progress Notes (Signed)
ABI's have been completed. Preliminary results can be found in CV Proc through chart review.   01/03/21 10:39 AM April [name] RVT

## 2021-01-03 NOTE — Consult Note (Signed)
[place] Nurse ostomy follow up Daughter not present again today after agreed upon time. Will proceed with pouch change.  Patient is weak, but asks some questions.  Stoma type/location: LLQ colostomy   Stomal assessment/size: pink and moist 1 3/4"  Pouch is cut off center to accommodate midline incision  Peristomal assessment: intact  Creasing at 3 and 9 o'clock.  Will add barrier ring.  Treatment options for stomal/peristomal skin: barrier ring and 2 piece pouch Output no stool noted yet Ostomy pouching: 2pc. 2 3/4" pouch  Education provided: Patient observes pouch change  Does not participate.  Daughter and patient will benefit from ongoing teaching.  Enrolled patient in [place] Start Discharge program: No Will follow.  Domenic Moras MSN, RN, FNP-BC CWON Wound, Ostomy, Continence Nurse Pager [phone]

## 2021-01-03 NOTE — Progress Notes (Addendum)
Nutrition Follow-up  DOCUMENTATION CODES:   Obesity unspecified  INTERVENTION:   Nepro Shake po BID, each supplement provides 425 kcal and 19 grams protein  45ml Prosource Plus po BID, each supplement provides 100 kcals and 15 grams of protein  NUTRITION DIAGNOSIS:   Inadequate oral intake related to inability to eat as evidenced by NPO status.  Progressing, pt now on renal diet  GOAL:   Provide needs based on ASPEN/SCCM guidelines  progressing  MONITOR:   Vent status,Labs,Weight trends,Skin,Other (Comment) (nutrition support initiation)  REASON FOR ASSESSMENT:   Ventilator    ASSESSMENT:   65 year old female with medical history of severe persistent asthma, chronic hypoxemic respiratory failure on 2L [place], ESRD s/p deceased donor renal transplant in 2013 on immune suppression followed by [facility], CHF, MGUS, and HTN.  She was recently seen in the ED due to uncomplicated sigmoid diverticulitis. She returned to the ED with pain and fever, admitted and started on abx, and developed afib with RVR. She has undergone TEE and cardioversion this admission. The morning of 2/6 patient developed acute abdominal pain and CT showed concern for free air in the abdomen and perforated diverticulitis; underwent Hartman's procedure. She developed acute hypoxia 2/2 septic shock related to perforation and peritonitis.  1/24 admit 2/2 TEE guided cardioversion 2/6 s/p ex lap, sigmoid colectomy, and end colostomy; remains intubated with OGT in place  2/8 extubated; PRBC for low Hgb  Pt unavailable at time of RD visit. Per RN, pt does not like hospital food.   PO Intake: 25-100% x last 8 recorded meals (67.5% average meal intake)  Admit wt: 75 kg Current wt: 77.2 kg  UOP: 547ml x24 hours  Medications: 262ml free water Q4H, ss novolog Q4H, solu-medrol, vitamin B12 Labs: Na 146 (H), K+ 5.5 (H) CBGs 86-209-186   Diet Order:   Diet Order            Diet renal/carb modified with fluid  restriction Diet-HS Snack? Nothing; Fluid restriction: 1200 mL Fluid; Room service appropriate? Yes; Fluid consistency: Thin  Diet effective now                 EDUCATION NEEDS:   Not appropriate for education at this time  Skin:  Skin Assessment: Skin Integrity Issues: Skin Integrity Issues:: Stage II,Other (Comment),Incisions Stage II: sacrum Incisions: abdomen (2/6) Other: MASD bilateral buttocks  Last BM:  2/10  Height:   Ht Readings from Last 1 Encounters:  12/29/20 5\' 1"  (1.549 m)    Weight:   Wt Readings from Last 1 Encounters:  12/29/20 77.2 kg    Ideal Body Weight:  47.73 kg  BMI:  Body mass index is 32.16 kg/m.  Estimated Nutritional Needs:   Kcal:  1900-2100  Protein:  90-100 grams  Fluid:  >1.9L    Larkin Ina, MS, RD, LDN RD pager number and weekend/on-call pager number located in [place].

## 2021-01-03 NOTE — Progress Notes (Signed)
Pharmacy Brief Note - Anticoagulation Follow Up:  Pt was started on heparin drip this afternoon for atrial fibrillation. See full note from Arlyn Dunning, PharmD from earlier today.   Assessment:  HL = 1 is SUPRAtherapeutic on heparin infusion of 900 units/hr  Confirmed with RN that heparin infusing at correct rate. No signs of bleeding, no oozing from wound site.   Heparin dosing weight = 65 kg  Goal: HL 0.3-0.5  Plan:   HOLD heparin for 1 hour  Decrease rate of heparin to 700 units/hr  Check 6 hour HL  HL, CBC daily while on heparin infusion  Monitor for signs of bleeding  [name], PharmD 01/03/21 8:54 PM

## 2021-01-03 NOTE — Progress Notes (Addendum)
ANTICOAGULATION CONSULT NOTE - Initial Consult  Pharmacy Consult for IV heparin (on apixaban PTA) Indication: atrial fibrillation  Allergies  Allergen Reactions  . Infed [Iron Dextran] Other (See Comments)    Chest tightness  . Pentamidine Itching, Shortness Of Breath and Swelling  . Budesonide-Formoterol Fumarate     Other reaction(s): hoarseness  . Bupropion     Other reaction(s): exacerbated depression  . Crestor [Rosuvastatin]     Other reaction(s): body aches, swelling  . Duloxetine Hcl     Other reaction(s): GI SE, weepy, worsening fatigue, foggy  . Erythromycin [Erythromycin] Other (See Comments)    Mouth Ulcers  . Iohexol Other (See Comments)    Per patient "she has had a kidney transplant and should never have contrast"  . Oxycodone Nausea Only and Nausea And Vomiting  . Pravastatin Sodium     Other reaction(s): myalgias  . Erythromycin Rash    Causes breakout in mouth  . Ultram [Tramadol Hcl] Anxiety    Patient Measurements: Height: 5\' 1"  (154.9 cm) Weight: 77.2 kg (170 lb 3.1 oz) IBW/kg (Calculated) : 47.8 Heparin Dosing Weight: 65 kg  Vital Signs: Temp: 98.4 F (36.9 C) (02/11 0856) Temp Source: Axillary (02/11 0856) BP: 162/79 (02/11 0404) Pulse Rate: 98 (02/11 0404)  Labs: Recent Labs    01/01/21 0457 01/01/21 1204 01/02/21 0256 01/03/21 0300 01/03/21 0720  HGB 6.7* 8.1* 8.9*  --  8.8*  HCT 22.1* 25.3* 27.9*  --  29.0*  PLT 109*  --  121*  --  96*  CREATININE 1.43*  --  1.37*  1.32* 1.48*  --     Estimated Creatinine Clearance: 36.6 mL/min (A) (by C-G formula based on SCr of 1.48 mg/dL (H)).   Medical History: Past Medical History:  Diagnosis Date  . Arthritis   . Asthma   . CHF (congestive heart failure) ([place])   . COPD (chronic obstructive pulmonary disease) ([place])   . Depression   . Essential hypertension 02/08/2020  . GERD (gastroesophageal reflux disease)   . Hyperlipidemia   . Hyperparathyroidism   . Polycystic kidney   . PONV  (postoperative nausea and vomiting)     Medications:  Scheduled:  . brimonidine  1 drop Both Eyes BID  . Chlorhexidine Gluconate Cloth  6 each Topical Daily  . cycloSPORINE  1 drop Both Eyes BID  . dorzolamide  1 drop Both Eyes Daily  . free water  200 mL Oral Q4H  . insulin aspart  0-15 Units Subcutaneous Q4H  . latanoprost  1 drop Both Eyes QHS  . mouth rinse  15 mL Mouth Rinse BID  . methylPREDNISolone (SOLU-MEDROL) injection  10 mg Intravenous Daily  . mycophenolate  180 mg Oral BID  . pantoprazole (PROTONIX) IV  40 mg Intravenous Daily  . tacrolimus  1 mg Oral BID  . valACYclovir  500 mg Oral BID  . cyanocobalamin  1,000 mcg Oral Daily   Infusions:  . sodium chloride Stopped (01/02/21 2107)  . dextrose 50 mL/hr at 01/03/21 0849  . piperacillin-tazobactam (ZOSYN)  IV 12.5 mL/hr at 01/03/21 3825    Assessment: 65 yo female on apixaban PTA and last inpatient dose of apixaban was on 2/5 presented with perforated acute sigmoid diverticulitis s/p Hartman's on 2/6 has had oozing from wound site and anticoagulation has not been restarted as a result until today 2/11 when CCS stated could be restarted if Hgb remained stable (8.9 > 8.8). Md ordered to start IV heparin per Rx, will not give  bolus due to oozing and s/p multiple blood transfusions the last few days  Goal of Therapy:  Heparin level 0.3-0.5 Monitor platelets by anticoagulation protocol: Yes   Plan:   NO IV heparin bolus  Start IV heparin at rate of 900 units/hr  Will check heparin level 8 hours after start of IV heparin  Daily CBC and heparin level  Kara Mead 01/03/2021,9:41 AM

## 2021-01-03 NOTE — Progress Notes (Addendum)
Progress Note  Patient Name: April [name] Date of Encounter: 01/03/2021  Primary Cardiologist: Sanda Klein, MD   Subjective   Feels A little stronger today, but tires very quickly. Abd very tender  Inpatient Medications    Scheduled Meds: . brimonidine  1 drop Both Eyes BID  . Chlorhexidine Gluconate Cloth  6 each Topical Daily  . cycloSPORINE  1 drop Both Eyes BID  . dorzolamide  1 drop Both Eyes Daily  . free water  200 mL Oral Q4H  . insulin aspart  0-15 Units Subcutaneous Q4H  . latanoprost  1 drop Both Eyes QHS  . mouth rinse  15 mL Mouth Rinse BID  . methylPREDNISolone (SOLU-MEDROL) injection  10 mg Intravenous Daily  . mycophenolate  180 mg Oral BID  . pantoprazole (PROTONIX) IV  40 mg Intravenous Daily  . sodium zirconium cyclosilicate  10 g Oral Once  . tacrolimus  1 mg Oral BID  . valACYclovir  500 mg Oral BID  . cyanocobalamin  1,000 mcg Oral Daily   Continuous Infusions: . sodium chloride Stopped (01/02/21 2107)  . dextrose    . piperacillin-tazobactam (ZOSYN)  IV 12.5 mL/hr at 01/03/21 0656   PRN Meds: sodium chloride, acetaminophen, fentaNYL (SUBLIMAZE) injection, hydrALAZINE, lip balm, naLOXone (NARCAN)  injection, [DISCONTINUED] ondansetron **OR** ondansetron (ZOFRAN) IV, oxyCODONE, promethazine   Vital Signs    Vitals:   01/03/21 0000 01/03/21 0100 01/03/21 0400 01/03/21 0404  BP: (!) 169/66 (!) 167/71  (!) 162/79  Pulse: 96 93  98  Resp: 10 10  20   Temp:   98 F (36.7 C)   TempSrc:   Axillary   SpO2: 100% 95%  100%  Weight:      Height:        Intake/Output Summary (Last 24 hours) at 01/03/2021 0822 Last data filed at 01/03/2021 0656 Gross per 24 hour  Intake 212.57 ml  Output 500 ml  Net -287.43 ml   Filed Weights   12/27/20 0515 12/28/20 0500 12/29/20 0500  Weight: 78.4 kg 77 kg 77.2 kg    Physical Exam   GEN: No acute distress.   Neck: No JVD Cardiac: RRR, soft murmur, no rubs, or gallops.  Respiratory:  diminished to auscultation bilaterally with rales in the bases. GI: Soft, ++tender, non-distended  MS: +pedal edema; No deformity. Cyanosis noted on both feet/toes (new today). Pulses difficult to detect due to edema, but cap refill normal Neuro:  Nonfocal  Psych: Normal affect .  Labs    Chemistry Recent Labs  Lab 01/01/21 0457 01/02/21 0256 01/03/21 0300  NA 138 137  135 146*  K 4.9 4.7  4.7 5.5*  CL 104 102  101 110  CO2 25 24  25  18*  GLUCOSE 104* 165*  166* 162*  BUN 69* 64*  62* 62*  CREATININE 1.43* 1.37*  1.32* 1.48*  CALCIUM 9.4 9.6  9.4 9.5  PROT 5.0* 5.4* 5.6*  ALBUMIN 2.3* 2.3*  2.3* 2.3*  AST 12* 12* 11*  ALT 11 11 11   ALKPHOS 43 48 53  BILITOT 1.8* 1.6* 1.4*  GFRNONAA 41* 43*  45* 40*  ANIONGAP 9 11  9  18*     Hematology Recent Labs  Lab 01/01/21 0457 01/01/21 1204 01/02/21 0256 01/03/21 0720  WBC 15.3*  --  19.3* 18.8*  RBC 2.07*  --  2.70* 2.72*  HGB 6.7* 8.1* 8.9* 8.8*  HCT 22.1* 25.3* 27.9* 29.0*  MCV 106.8*  --  103.3* 106.6*  MCH 32.4  --  33.0 32.4  MCHC 30.3  --  31.9 30.3  RDW 20.8*  --  20.0* 19.4*  PLT 109*  --  121* 96*    Cardiac EnzymesNo results for input(s): TROPONINI in the last 168 hours. No results for input(s): TROPIPOC in the last 168 hours.   BNPNo results for input(s): BNP, PROBNP in the last 168 hours.   DDimer No results for input(s): DDIMER in the last 168 hours.   Radiology    No results found. Telemetry    Maintaining SR - Personally Reviewed  ECG    No new tracing as of 01/02/21- Personally Reviewed  Cardiac Studies   ECHO 12/17/20 IMPRESSIONS  1. Left ventricular ejection fraction, by estimation, is 70 to 75%. The  left ventricle has hyperdynamic function. The left ventricle has no  regional wall motion abnormalities. Left ventricular diastolic parameters  are consistent with Grade II diastolic  dysfunction (pseudonormalization). Elevated left atrial pressure.  2. Right ventricular  systolic function is normal. The right ventricular  size is normal. There is normal pulmonary artery systolic pressure.  3. The mitral valve is normal in structure. No evidence of mitral valve  regurgitation. No evidence of mitral stenosis.  4. The aortic valve is tricuspid. Aortic valve regurgitation is not  visualized. No aortic stenosis is present.  5. The inferior vena cava is normal in size with greater than 50%  respiratory variability, suggesting right atrial pressure of 3 mmHg.   Patient Profile     65 y.o. female with a hx of HTN, GERD, COPD, MGUS, HFpEF,SVT 01/2020,ESRD with renal transplant 2013 on immunosuppressants followed at [facility], admitted 1.24.22 with diverticulitis after failing outpt therapy. Initially followed by cardiology for hypotension, however now with new onset atrial flutter with RVR.   Assessment & Plan    1. Atrial flutter with RVR: - in setting of acute illness - s/p TEE/DCCV, maintaining sinus rhythm -Started on Eliquis initially, held for surgery.  Restarting heparin drip today -Restart metoprolol 25 mg BID -Transition to Eliquis once able  2. Septic shock secondary to perforated sigmoid diverticulitis: - Hartmann's procedure performed 02/06, extubated and off pressors 02/08. - ABX and management per CCS/IM  3. Acute on chronic diastolic CHF: - I/O net + 2 L at one point, improving, now net +562 ml - no wt since 02/06, need daily weights - Nephrology signed off 02/09  4. Acute on chronic CKD with hx of renal transplant: - felt to have AKI 2nd volume depletion, sepsis, CHF - Nephrology was following, signed off - Cr peak 2.71, now 1.48 - per IM  Otherwise, per IM Signed, Rosaria Ferries, PA-C 01/03/2021 8:22 AM     For questions or updates, please contact   Please consult [email or web address] for contact info under Cardiology/STEMI.  Patient seen and examined.  Agree with above documentation.  On exam, patient is alert and oriented, regular  rate and rhythm, 2/6 systolic murmur, lungs CTAB, + BLE edema. Started on IV heparin today. Plan to switch to p.o. Eliquis when able. Will restart metoprolol  Donato Heinz, MD

## 2021-01-03 NOTE — Progress Notes (Signed)
5 Days Post-Op  Subjective: Patient having blue fingers and toes today.  History of Raynaud's.  Not feeling as well today with some nausea, but no emesis.  Colostomy working with flatus only thus far.  ROS: See above, otherwise other systems negative  Objective: Vital signs in last 24 hours: Temp:  [97.7 F (36.5 C)-98.4 F (36.9 C)] 98.4 F (36.9 C) (02/11 0856) Pulse Rate:  [91-103] 98 (02/11 0404) Resp:  [10-23] 20 (02/11 0404) BP: (142-201)/(59-136) 162/79 (02/11 0404) SpO2:  [78 %-100 %] 100 % (02/11 0404) Last BM Date: 01/02/21  Intake/Output from previous day: 02/10 0701 - 02/11 0700 In: 212.6 [I.V.:45.2; IV Piggyback:167.4] Out: 500 [Urine:500] Intake/Output this shift: No intake/output data recorded.  PE: Abd: still soft, maybe slightly more bloated today, but no significantly, ostomy with flatus, but no stool present, just sweat.  Stoma is pink and viable.  Midline wound is clean but one skin edge bleeder that was silver nitrated again today.  Lab Results:  Recent Labs    01/02/21 0256 01/03/21 0720  WBC 19.3* 18.8*  HGB 8.9* 8.8*  HCT 27.9* 29.0*  PLT 121* 96*   BMET Recent Labs    01/02/21 0256 01/03/21 0300  NA 137  135 146*  K 4.7  4.7 5.5*  CL 102  101 110  CO2 24  25 18*  GLUCOSE 165*  166* 162*  BUN 64*  62* 62*  CREATININE 1.37*  1.32* 1.48*  CALCIUM 9.6  9.4 9.5   PT/INR No results for input(s): LABPROT, INR in the last 72 hours. CMP     Component Value Date/Time   NA 146 (H) 01/03/2021 0300   K 5.5 (H) 01/03/2021 0300   CL 110 01/03/2021 0300   CO2 18 (L) 01/03/2021 0300   GLUCOSE 162 (H) 01/03/2021 0300   BUN 62 (H) 01/03/2021 0300   CREATININE 1.48 (H) 01/03/2021 0300   CREATININE 1.28 (H) 11/04/2020 0952   CALCIUM 9.5 01/03/2021 0300   CALCIUM 10.8 (H) 10/13/2011 1405   PROT 5.6 (L) 01/03/2021 0300   ALBUMIN 2.3 (L) 01/03/2021 0300   AST 11 (L) 01/03/2021 0300   AST 13 (L) 11/04/2020 0952   ALT 11 01/03/2021  0300   ALT 12 11/04/2020 0952   ALKPHOS 53 01/03/2021 0300   BILITOT 1.4 (H) 01/03/2021 0300   BILITOT 0.6 11/04/2020 0952   GFRNONAA 40 (L) 01/03/2021 0300   GFRNONAA 47 (L) 11/04/2020 0952   GFRAA 51 (L) 08/05/2020 1523   Lipase     Component Value Date/Time   LIPASE 49 12/29/2020 0822       Studies/Results: No results found.  Anti-infectives: Anti-infectives (From admission, onward)   Start     Dose/Rate Route Frequency Ordered Stop   12/31/20 2200  valACYclovir (VALTREX) tablet 500 mg        500 mg Oral 2 times daily 12/31/20 2100     12/31/20 1000  valACYclovir (VALTREX) compounded oral suspension 500 mg  Status:  Discontinued        500 mg Per Tube 2 times daily 12/30/20 2051 12/31/20 2100   12/29/20 1045  piperacillin-tazobactam (ZOSYN) IVPB 3.375 g        3.375 g 12.5 mL/hr over 240 Minutes Intravenous Every 8 hours 12/29/20 0956     12/24/20 0800  ciprofloxacin (CIPRO) tablet 500 mg  Status:  Discontinued        500 mg Oral Daily with breakfast 12/23/20 1721 12/23/20 1737   12/23/20  2200  metroNIDAZOLE (FLAGYL) tablet 500 mg  Status:  Discontinued        500 mg Oral Every 8 hours 12/23/20 1721 12/23/20 1737   12/23/20 2200  amoxicillin-clavulanate (AUGMENTIN) 500-125 MG per tablet 500 mg  Status:  Discontinued        1 tablet Oral 2 times daily 12/23/20 1739 12/29/20 0949   12/18/20 1100  Ampicillin-Sulbactam (UNASYN) 3 g in sodium chloride 0.9 % 100 mL IVPB  Status:  Discontinued        3 g 200 mL/hr over 30 Minutes Intravenous Every 12 hours 12/18/20 0729 12/23/20 1721   12/17/20 1400  Ampicillin-Sulbactam (UNASYN) 3 g in sodium chloride 0.9 % 100 mL IVPB  Status:  Discontinued        3 g 200 mL/hr over 30 Minutes Intravenous Every 6 hours 12/17/20 1004 12/18/20 0729   12/16/20 2200  ciprofloxacin (CIPRO) IVPB 400 mg  Status:  Discontinued        400 mg 200 mL/hr over 60 Minutes Intravenous Every 12 hours 12/16/20 1451 12/16/20 1734   12/16/20 2200   piperacillin-tazobactam (ZOSYN) IVPB 3.375 g  Status:  Discontinued        3.375 g 12.5 mL/hr over 240 Minutes Intravenous Every 8 hours 12/16/20 1734 12/17/20 0949   12/16/20 1800  metroNIDAZOLE (FLAGYL) IVPB 500 mg  Status:  Discontinued        500 mg 100 mL/hr over 60 Minutes Intravenous Every 8 hours 12/16/20 1451 12/16/20 1734   12/16/20 1515  valACYclovir (VALTREX) tablet 500 mg  Status:  Discontinued        500 mg Oral 2 times daily 12/16/20 1505 12/30/20 2048   12/16/20 0915  ciprofloxacin (CIPRO) IVPB 400 mg        400 mg 200 mL/hr over 60 Minutes Intravenous  Once 12/16/20 0912 12/16/20 1104   12/16/20 0915  metroNIDAZOLE (FLAGYL) IVPB 500 mg        500 mg 100 mL/hr over 60 Minutes Intravenous  Once 12/16/20 0912 12/16/20 1104       Assessment/Plan Polycystic disease with renal transplant 2013-Prograf/prednisone. Renalfollowing Elevated Cr CHF/uncertain etiology -Cards following Chronic COPD/history of tobacco use- On 2L PRN at home Pleuritic chest pain and SOB- VQ 1/25:Low probability pulmonary embolus. Age indeterminate, non occlusive deep vein thrombosis involving the right popliteal vein Hx SVT Atrial flutter with RVR- on eliquis, which is currently on hold. May resume heparin gtt today - Per medicine -  POD5, s/p Hartman's for Perforated sigmoid diverticulitis by Dr. Ninfa Linden on 2/6 -keep on FLD today and see how her nausea  -ok to restart anticoagulation -mobilize, PT eval -IS/pulm toilet -DC abx today after 5 days post op.  WBC up some, but can't rule out steroids as etiology.  Will follow closely   FEN:FLD/IVFs VC:BSWHQPRFFMB 1/24;Zosyn1/24-1/25; Unasyn 1/25; zosyn 2/6 -->2/11 WGY:[id number] gtt   LOS: 18 days    Henreitta Cea , [facility] 01/03/2021, 10:20 AM Please see Amion for pager number during day hours 7:00am-4:30pm or 7:00am -11:30am on weekends

## 2021-01-03 NOTE — Progress Notes (Addendum)
PROGRESS NOTE   April [name]  LXB:[id number]    DOB: [birth year]    DOA: 12/16/2020  PCP: [name], MD   I have briefly reviewed patients previous medical records in [facility].  Chief Complaint  Patient presents with  . Abdominal Pain    Brief Narrative:  PCCM to [facility] transfer 01/02/2021: 65 year old female with medical history of severe persistent asthma, chronic respiratory failure with hypoxia on home oxygen 2 L/min as needed, ESRD s/p deceased donor renal transplant in 2013 on immunosuppression and followed at [facility], chronic diastolic CHF, MGUS, HTN, recently seen in the ER for uncomplicated sigmoid diverticulitis, returned to the hospital with abdominal pain and fever.  She was admitted to the hospital and started on antibiotics, developed atrial fibrillation with RVR.  She underwent successful TEE with [facility] cardioversion.  On the morning of 12/29/2020, patient developed acute abdominal pain and CT imaging concerning for free air within the abdomen from perforated diverticulitis. S/p Hartman's procedure, postop remained on ventilator and vasopressors.  Stabilized and care transferred to [facility].   Assessment & Plan:  Active Problems:   GERD without esophagitis   Renal transplant recipient   Depression   Sepsis ([place])   SOB (shortness of breath)   Acute diverticulitis   Sigmoid diverticulitis   Acute CHF (congestive heart failure) (HCC)   Bilious vomiting with nausea   Acute respiratory failure with hypoxia secondary to septic shock due to perforated diverticulitis and peritonitis complicating underlying COPD, chronic respiratory failure with hypoxia: She was managed by critical care in the ICU.  Intubated, extubated 2/8 and doing well on [place] oxygen 2 L/min.  Shock resolved and weaned off vasopressors.  Mobilize with PT.  Incentive spirometry.  Patient states that she only uses oxygen as needed 2 L/min.  Saturation goal: 88-92%.  Continue IV Zosyn.  Appears to have been weaned  down to 1 L/min [place] oxygen.  Perforated acute sigmoid diverticulitis s/p Hartman's on 2/6 General surgery follow-up appreciated.  Advancing diet, up to full liquids, not advancing today.  Continue IV Zosyn.  Suspected OSA OSA suspected based on apneic episodes at night.  Per PCCM, continue CPAP for now at night while hospitalized but eventually will need sleep study as an outpatient.  Outpatient follow-up with pulmonology/Dr. Vaughan Browner is her primary pulmonologist.  Type II DM with hyperglycemia Complicated by steroids.  Off Lantus due to hypoglycemic episodes.  Reasonable control on SSI alone, monitor.  Atrial flutter with RVR, s/p TEE and DCCV on 12/25/2020 Currently in sinus rhythm.  Apixaban was discontinued on 2/6 for her abdominal surgery.  Anticoagulation had not been resumed due to slow oozing from the wound, needed blood transfusion on 2/7 and 2/9.  As per discussion with general surgery, resumed IV heparin 2/11 and if no further bleeding/oozing, stable hemoglobin then consider switching to Eliquis in 48 h.  Cardiology have resumed metoprolol 25 mg twice daily.  Acute on chronic diastolic CHF Volume management per nephrology.  I discussed with Dr. Jonnie Finner due to worsening creatinine, hypernatremia and hyperkalemia, okay with IV D5 at 75 mL/h with close monitoring.  Acute kidney injury complicating CKD stage III a: Baseline creatinine 1.1-1.5.  AKI due to volume depletion from nausea and vomiting, hypotension, sepsis, third spacing and acute on chronic diastolic CHF.  Serum creatinine peaked to 2.7.  Creatinine has gone up from 1.32-1.48 along with hypernatremia and hyperkalemia.  AKI resolved.  Nephrology signed off 2/9.  Discussed with nephrology as noted above and initiated IV  D5 infusion.  Hypernatremia Increase free water intake by mouth and started IV D5 W.  Follow BMP in a.m.  Hyperkalemia Give a dose of Lokelma, change diet to low potassium diet.  Follow BMP in a.m.  Adrenal  insufficiency: Low a.m. cortisol, renin/aldosterone in process.  Blood pressures normal.  Continue Solu-Medrol 10 mg IV daily for now and eventually needs to go back on home dose of prednisone 7.5 mg daily.  S/p renal transplant Per nephrology recommendations, continue Prograf/Myfortic.  Tacrolimus level drawn 2/4 still pending.  On Solu-Medrol taper, eventually to transition to home dose of prednisone 7.5 mg daily and should not be tapered lower than that.  Postop acute blood loss anemia: S/p transfusions.  Hemoglobin up to 8 g range.  Follow CBC in a.m. and transfuse if hemoglobin 7 g or less.  Hemoglobin remained stable in the 8 g range.  Thrombocytopenia: Platelet count slightly worse to 96 today.  Follow CBC in a.m.  Difficult peripheral IV access Per IV team, no options for peripheral IV.  PCCM consulted for central line.  No peripheral PICC or midline due to history of renal transplant and chronic kidney disease.  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  Bilateral toe brachial indices abnormal.  Etiology unclear.  We'll try to clarify with her GI MD.  Discussed with RN to try and keep fingers and toes socks and gloves.  Trial of low-dose amlodipine 5 mg daily.  Body mass index is 32.16 kg/m.  Nutritional Status Nutrition Problem: Inadequate oral intake Etiology: inability to eat Signs/Symptoms: NPO status Interventions: Refer to RD note for recommendations    DVT prophylaxis: Place and maintain sequential compression device Start: 12/31/20 1004     Code Status: Full Code Family Communication: I discussed in detail with patient's daughter via phone, updated care and answered questions.  She did indicate that patient had an history of intermittent bluish discoloration of her toes and fingers.  Earlier I was unable to reach patient's spouse via phone. Disposition:  Status is: Inpatient  Remains inpatient appropriate  because:Inpatient level of care appropriate due to severity of illness   Dispo:  Patient From: Home  Planned Disposition: Home with Health Care Svc  Expected discharge date: 01/03/2021  Medically stable for discharge: No          Consultants:   General surgery Cardiology Nephrology  Procedures:   12/29/2020: Exploratory laparotomy, sigmoid colectomy and end colostomy, central venous line, arterial line. 12/31/2020: Extubated. Foley catheter-to be discontinued 2/10  Significant Hospital events: 1/24 Admit 2/2 TEE guided cardioversion 2/6 Hartman's procedure for perf diverticulitis, remains on vent post op 2/8 Extubated, PRBC for low Hb   Antimicrobials:    Anti-infectives (From admission, onward)   Start     Dose/Rate Route Frequency Ordered Stop   12/31/20 2200  valACYclovir (VALTREX) tablet 500 mg        500 mg Oral 2 times daily 12/31/20 2100     12/31/20 1000  valACYclovir (VALTREX) compounded oral suspension 500 mg  Status:  Discontinued        500 mg Per Tube 2 times daily 12/30/20 2051 12/31/20 2100   12/29/20 1045  piperacillin-tazobactam (ZOSYN) IVPB 3.375 g        3.375 g 12.5 mL/hr over 240 Minutes Intravenous Every 8 hours 12/29/20 0956 01/03/21 2359   12/24/20 0800  ciprofloxacin (CIPRO) tablet 500 mg  Status:  Discontinued  500 mg Oral Daily with breakfast 12/23/20 1721 12/23/20 1737   12/23/20 2200  metroNIDAZOLE (FLAGYL) tablet 500 mg  Status:  Discontinued        500 mg Oral Every 8 hours 12/23/20 1721 12/23/20 1737   12/23/20 2200  amoxicillin-clavulanate (AUGMENTIN) 500-125 MG per tablet 500 mg  Status:  Discontinued        1 tablet Oral 2 times daily 12/23/20 1739 12/29/20 0949   12/18/20 1100  Ampicillin-Sulbactam (UNASYN) 3 g in sodium chloride 0.9 % 100 mL IVPB  Status:  Discontinued        3 g 200 mL/hr over 30 Minutes Intravenous Every 12 hours 12/18/20 0729 12/23/20 1721   12/17/20 1400  Ampicillin-Sulbactam (UNASYN) 3 g in sodium chloride  0.9 % 100 mL IVPB  Status:  Discontinued        3 g 200 mL/hr over 30 Minutes Intravenous Every 6 hours 12/17/20 1004 12/18/20 0729   12/16/20 2200  ciprofloxacin (CIPRO) IVPB 400 mg  Status:  Discontinued        400 mg 200 mL/hr over 60 Minutes Intravenous Every 12 hours 12/16/20 1451 12/16/20 1734   12/16/20 2200  piperacillin-tazobactam (ZOSYN) IVPB 3.375 g  Status:  Discontinued        3.375 g 12.5 mL/hr over 240 Minutes Intravenous Every 8 hours 12/16/20 1734 12/17/20 0949   12/16/20 1800  metroNIDAZOLE (FLAGYL) IVPB 500 mg  Status:  Discontinued        500 mg 100 mL/hr over 60 Minutes Intravenous Every 8 hours 12/16/20 1451 12/16/20 1734   12/16/20 1515  valACYclovir (VALTREX) tablet 500 mg  Status:  Discontinued        500 mg Oral 2 times daily 12/16/20 1505 12/30/20 2048   12/16/20 0915  ciprofloxacin (CIPRO) IVPB 400 mg        400 mg 200 mL/hr over 60 Minutes Intravenous  Once 12/16/20 0912 12/16/20 1104   12/16/20 0915  metroNIDAZOLE (FLAGYL) IVPB 500 mg        500 mg 100 mL/hr over 60 Minutes Intravenous  Once 12/16/20 0912 12/16/20 1104        Subjective:  Seen this morning.  Abdominal pain slightly more than yesterday.  Poor appetite-doesn't seem to like hospital food.  Reports drinking water.  Foley out.  Voiding without difficulty.  Objective:   Vitals:   01/03/21 1050 01/03/21 1236 01/03/21 1310 01/03/21 1400  BP: (!) 154/62  133/67 138/76  Pulse:    92  Resp:    11  Temp:  97.7 F (36.5 C)    TempSrc:  Axillary    SpO2:    98%  Weight:      Height:        General exam: Middle-age female, moderately built and obese sitting up comfortably in bed.  Seen along with surgical team.  Did not appear in great spirits like yesterday or day before. Respiratory system: Clear to auscultation.  No increased work of breathing. Cardiovascular system: S1 & S2 heard, RRR. No JVD, murmurs, rubs, gallops or clicks. No pedal edema.  Telemetry personally reviewed: Sinus  rhythm. Gastrointestinal system: Abdomen is nondistended, soft and nontender. No organomegaly or masses felt. Normal bowel sounds heard.  Dressing over surgical site clean and dry.  Left Hartman's pouch without small amount of liquids and air in bag. Central nervous system: Alert and oriented. No focal neurological deficits. Extremities: Symmetric 5 x 5 power.  Bilateral toes dusky and cool >fingers.  Palpable  distal pulses. Skin: Fingers and toes as noted above. Psychiatry: Judgement and insight appear normal. Mood & affect appropriate.     Data Reviewed:   I have personally reviewed following labs and imaging studies   CBC: Recent Labs  Lab 12/31/20 0530 01/01/21 0457 01/01/21 1204 01/02/21 0256 01/03/21 0720  WBC 11.6* 15.3*  --  19.3* 18.8*  NEUTROABS 10.6* 14.5*  --  17.8*  --   HGB 7.6* 6.7* 8.1* 8.9* 8.8*  HCT 24.0* 22.1* 25.3* 27.9* 29.0*  MCV 101.3* 106.8*  --  103.3* 106.6*  PLT 104* 109*  --  121* 96*    Basic Metabolic Panel: Recent Labs  Lab 12/31/20 0530 12/31/20 1259 01/01/21 0457 01/02/21 0256 01/03/21 0300  NA 139  --  138 137  135 146*  K 4.2  --  4.9 4.7  4.7 5.5*  CL 104  --  104 102  101 110  CO2 25  --  25 24  25  18*  GLUCOSE 96  --  104* 165*  166* 162*  BUN 76*  --  69* 64*  62* 62*  CREATININE 1.41*  --  1.43* 1.37*  1.32* 1.48*  CALCIUM 9.6  --  9.4 9.6  9.4 9.5  MG 2.2 2.0  --  2.1  --   PHOS 3.5  --  4.5 4.3  4.4  --     Liver Function Tests: Recent Labs  Lab 01/01/21 0457 01/02/21 0256 01/03/21 0300  AST 12* 12* 11*  ALT 11 11 11   ALKPHOS 43 48 53  BILITOT 1.8* 1.6* 1.4*  PROT 5.0* 5.4* 5.6*  ALBUMIN 2.3* 2.3*  2.3* 2.3*    CBG: Recent Labs  Lab 01/03/21 0310 01/03/21 0741 01/03/21 1111  GLUCAP 140* 105* 29    Microbiology Studies:   Recent Results (from the past 240 hour(s))  SARS Coronavirus 2 by RT PCR (hospital order, performed in [place] hospital lab) Nasopharyngeal Nasopharyngeal Swab      Status: None   Collection Time: 12/25/20  8:30 AM   Specimen: Nasopharyngeal Swab  Result Value Ref Range Status   SARS Coronavirus 2 NEGATIVE NEGATIVE Final    Comment: (NOTE) SARS-CoV-2 target nucleic acids are NOT DETECTED.  The SARS-CoV-2 RNA is generally detectable in upper and lower respiratory specimens during the acute phase of infection. The lowest concentration of SARS-CoV-2 viral copies this assay can detect is 250 copies / mL. A negative result does not preclude SARS-CoV-2 infection and should not be used as the sole basis for treatment or other patient management decisions.  A negative result may occur with improper specimen collection / handling, submission of specimen other than nasopharyngeal swab, presence of viral mutation(s) within the areas targeted by this assay, and inadequate number of viral copies (<250 copies / mL). A negative result must be combined with clinical observations, patient history, and epidemiological information.  Fact Sheet for Patients:   StrictlyIdeas.no  Fact Sheet for Healthcare Providers: BankingDealers.co.za  This test is not yet approved or  cleared by the [place] FDA and has been authorized for detection and/or diagnosis of SARS-CoV-2 by FDA under an Emergency Use Authorization (EUA).  This EUA will remain in effect (meaning this test can be used) for the duration of the COVID-19 declaration under Section 564(b)(1) of the Act, 21 U.S.C. section 360bbb-3(b)(1), unless the authorization is terminated or revoked sooner.  Performed at [facility], [place] [address], [place], [place] [address]  Radiology Studies:  VAS [place] ABI WITH/WO TBI  Result Date: 01/03/2021 LOWER EXTREMITY DOPPLER STUDY Indications: Dusky Toes. High Risk Factors: Hypertension, hyperlipidemia.  Limitations: Today's exam was limited due to Left restricted arm. Comparison Study: No prior  studies. Performing Technologist: Carlos Levering RVT  Examination Guidelines: A complete evaluation includes at minimum, Doppler waveform signals and systolic blood pressure reading at the level of bilateral brachial, anterior tibial, and posterior tibial arteries, when vessel segments are accessible. Bilateral testing is considered an integral part of a complete examination. Photoelectric Plethysmograph (PPG) waveforms and toe systolic pressure readings are included as required and additional duplex testing as needed. Limited examinations for reoccurring indications may be performed as noted.  ABI Findings: +---------+------------------+-----+---------+--------+ Right    Rt Pressure (mmHg)IndexWaveform Comment  +---------+------------------+-----+---------+--------+ Brachial 141                    triphasic         +---------+------------------+-----+---------+--------+ PTA      203               1.44 triphasic         +---------+------------------+-----+---------+--------+ DP       153               1.09 triphasic         +---------+------------------+-----+---------+--------+ Great Toe0                 0.00                   +---------+------------------+-----+---------+--------+ +---------+------------------+-----+---------+--------------+ Left     Lt Pressure (mmHg)IndexWaveform Comment        +---------+------------------+-----+---------+--------------+ Brachial                                 Restricted arm +---------+------------------+-----+---------+--------------+ PTA      194               1.38 triphasic               +---------+------------------+-----+---------+--------------+ DP       147               1.04 triphasic               +---------+------------------+-----+---------+--------------+ Great Toe0                 0.00                         +---------+------------------+-----+---------+--------------+  +-------+-----------+-----------+------------+------------+ ABI/TBIToday's ABIToday's TBIPrevious ABIPrevious TBI +-------+-----------+-----------+------------+------------+ Right  1.44       0                                   +-------+-----------+-----------+------------+------------+ Left   1.38       0                                   +-------+-----------+-----------+------------+------------+  Summary: Right: Resting right ankle-brachial index indicates noncompressible right lower extremity arteries. The right toe-brachial index is abnormal. Left: Resting left ankle-brachial index indicates noncompressible left lower extremity arteries. The left toe-brachial index is abnormal.  *See table(s) above for measurements and observations.     Preliminary      Scheduled Meds:   .  brimonidine  1 drop Both Eyes BID  . Chlorhexidine Gluconate Cloth  6 each Topical Daily  . cycloSPORINE  1 drop Both Eyes BID  . dorzolamide  1 drop Both Eyes Daily  . free water  200 mL Oral Q4H  . insulin aspart  0-15 Units Subcutaneous Q4H  . latanoprost  1 drop Both Eyes QHS  . mouth rinse  15 mL Mouth Rinse BID  . methylPREDNISolone (SOLU-MEDROL) injection  10 mg Intravenous Daily  . metoprolol tartrate  25 mg Oral BID  . mycophenolate  180 mg Oral BID  . pantoprazole (PROTONIX) IV  40 mg Intravenous Daily  . tacrolimus  1 mg Oral BID  . valACYclovir  500 mg Oral BID  . cyanocobalamin  1,000 mcg Oral Daily    Continuous Infusions:   . sodium chloride Stopped (01/02/21 2107)  . dextrose 75 mL/hr at 01/03/21 1300  . heparin 900 Units/hr (01/03/21 1131)  . piperacillin-tazobactam (ZOSYN)  IV 3.375 g (01/03/21 1316)     LOS: 18 days     Vernell Leep, MD, [place], [facility]. [facility]    To contact the attending provider between 7A-7P or the covering provider during after hours 7P-7A, please log into the web site [email or web address] and access using universal [place] password for that  web site. If you do not have the password, please call the hospital operator.  01/03/2021, 2:52 PM

## 2021-01-04 DIAGNOSIS — I5031 Acute diastolic (congestive) heart failure: Secondary | ICD-10-CM | POA: Diagnosis not present

## 2021-01-04 DIAGNOSIS — Z789 Other specified health status: Secondary | ICD-10-CM

## 2021-01-04 DIAGNOSIS — N179 Acute kidney failure, unspecified: Secondary | ICD-10-CM | POA: Diagnosis not present

## 2021-01-04 DIAGNOSIS — E875 Hyperkalemia: Secondary | ICD-10-CM | POA: Diagnosis not present

## 2021-01-04 DIAGNOSIS — E870 Hyperosmolality and hypernatremia: Secondary | ICD-10-CM | POA: Diagnosis not present

## 2021-01-04 DIAGNOSIS — Z940 Kidney transplant status: Secondary | ICD-10-CM | POA: Diagnosis not present

## 2021-01-04 DIAGNOSIS — K5792 Diverticulitis of intestine, part unspecified, without perforation or abscess without bleeding: Secondary | ICD-10-CM | POA: Diagnosis not present

## 2021-01-04 DIAGNOSIS — I7300 Raynaud's syndrome without gangrene: Secondary | ICD-10-CM | POA: Diagnosis not present

## 2021-01-04 DIAGNOSIS — I483 Typical atrial flutter: Secondary | ICD-10-CM | POA: Diagnosis not present

## 2021-01-04 LAB — GLUCOSE, CAPILLARY
Glucose-Capillary: 127 mg/dL — ABNORMAL HIGH (ref 70–99)
Glucose-Capillary: 118 mg/dL — ABNORMAL HIGH (ref 70–99)
Glucose-Capillary: 184 mg/dL — ABNORMAL HIGH (ref 70–99)
Glucose-Capillary: 248 mg/dL — ABNORMAL HIGH (ref 70–99)
Glucose-Capillary: 248 mg/dL — ABNORMAL HIGH (ref 70–99)
Glucose-Capillary: 210 mg/dL — ABNORMAL HIGH (ref 70–99)

## 2021-01-04 LAB — CBC
HCT: 25.6 % — ABNORMAL LOW (ref 36.0–46.0)
Hemoglobin: 8.1 g/dL — ABNORMAL LOW (ref 12.0–15.0)
MCH: 32.4 pg (ref 26.0–34.0)
MCHC: 31.6 g/dL (ref 30.0–36.0)
MCV: 102.4 fL — ABNORMAL HIGH (ref 80.0–100.0)
Platelets: 108 10*3/uL — ABNORMAL LOW (ref 150–400)
RBC: 2.5 MIL/uL — ABNORMAL LOW (ref 3.87–5.11)
RDW: 18.6 % — ABNORMAL HIGH (ref 11.5–15.5)
WBC: 14.9 10*3/uL — ABNORMAL HIGH (ref 4.0–10.5)
nRBC: 0 % (ref 0.0–0.2)

## 2021-01-04 LAB — RENAL FUNCTION PANEL
Albumin: 2.2 g/dL — ABNORMAL LOW (ref 3.5–5.0)
Anion gap: 10 (ref 5–15)
BUN: 67 mg/dL — ABNORMAL HIGH (ref 8–23)
CO2: 24 mmol/L (ref 22–32)
Calcium: 9.5 mg/dL (ref 8.9–10.3)
Chloride: 98 mmol/L (ref 98–111)
Creatinine, Ser: 1.48 mg/dL — ABNORMAL HIGH (ref 0.44–1.00)
GFR, Estimated: 40 mL/min — ABNORMAL LOW (ref >60)
Glucose, Bld: 139 mg/dL — ABNORMAL HIGH (ref 70–99)
Phosphorus: 4.3 mg/dL (ref 2.5–4.6)
Potassium: 4.8 mmol/L (ref 3.5–5.1)
Sodium: 132 mmol/L — ABNORMAL LOW (ref 135–145)

## 2021-01-04 LAB — HEPARIN LEVEL (UNFRACTIONATED)
Heparin Unfractionated: 0.84 IU/mL — ABNORMAL HIGH (ref 0.30–0.70)
Heparin Unfractionated: 0.78 IU/mL — ABNORMAL HIGH (ref 0.30–0.70)
Heparin Unfractionated: 0.72 IU/mL — ABNORMAL HIGH (ref 0.30–0.70)

## 2021-01-04 MED ORDER — PANTOPRAZOLE SODIUM 40 MG PO TBEC
40.0000 mg | DELAYED_RELEASE_TABLET | Freq: Every day | ORAL | Status: DC
  Administered 2021-01-05 – 2021-01-14 (×10): 40 mg via ORAL
  Filled 2021-01-04 (×10): qty 1

## 2021-01-04 MED ORDER — PREDNISONE 5 MG PO TABS
7.5000 mg | ORAL_TABLET | Freq: Every day | ORAL | Status: DC
  Administered 2021-01-05 – 2021-01-14 (×10): 7.5 mg via ORAL
  Filled 2021-01-04 (×10): qty 2

## 2021-01-04 MED ORDER — HEPARIN (PORCINE) 25000 UT/250ML-% IV SOLN
250.0000 [IU]/h | INTRAVENOUS | Status: DC
  Administered 2021-01-04: 550 [IU]/h via INTRAVENOUS
  Administered 2021-01-08: 250 [IU]/h via INTRAVENOUS
  Filled 2021-01-04: qty 250

## 2021-01-04 NOTE — Progress Notes (Addendum)
ANTICOAGULATION CONSULT NOTE - Follow Up Consult  Pharmacy Consult for Heparin Indication: atrial fibrillation  Allergies  Allergen Reactions  . Infed [Iron Dextran] Other (See Comments)    Chest tightness  . Pentamidine Itching, Shortness Of Breath and Swelling  . Budesonide-Formoterol Fumarate     Other reaction(s): hoarseness  . Bupropion     Other reaction(s): exacerbated depression  . Crestor [Rosuvastatin]     Other reaction(s): body aches, swelling  . Duloxetine Hcl     Other reaction(s): GI SE, weepy, worsening fatigue, foggy  . Erythromycin [Erythromycin] Other (See Comments)    Mouth Ulcers  . Iohexol Other (See Comments)    Per patient "she has had a kidney transplant and should never have contrast"  . Oxycodone Nausea Only and Nausea And Vomiting  . Pravastatin Sodium     Other reaction(s): myalgias  . Erythromycin Rash    Causes breakout in mouth  . Ultram [Tramadol Hcl] Anxiety    Patient Measurements: Height: 5\' 1"  (154.9 cm) Weight: 80.6 kg (177 lb 11.1 oz) IBW/kg (Calculated) : 47.8 Heparin Dosing Weight: 65 kg  Vital Signs: Temp: 97.6 F (36.4 C) (02/12 0458) Temp Source: Oral (02/12 0458) BP: 140/46 (02/11 2352) Pulse Rate: 65 (02/11 2352)  Labs: Recent Labs    01/01/21 1204 01/02/21 0256 01/03/21 0300 01/03/21 0720 01/03/21 1939 01/04/21 0556  HGB 8.1* 8.9*  --  8.8*  --   --   HCT 25.3* 27.9*  --  29.0*  --   --   PLT  --  121*  --  96*  --   --   HEPARINUNFRC  --   --   --   --  1.00* 0.84*  CREATININE  --  1.37*  1.32* 1.48*  --   --  1.48*    Estimated Creatinine Clearance: 37.4 mL/min (A) (by C-G formula based on SCr of 1.48 mg/dL (H)).   Medications:  Scheduled:  . (feeding supplement) PROSource Plus  30 mL Oral BID BM  . amLODipine  5 mg Oral Daily  . brimonidine  1 drop Both Eyes BID  . Chlorhexidine Gluconate Cloth  6 each Topical Daily  . cycloSPORINE  1 drop Both Eyes BID  . dorzolamide  1 drop Both Eyes Daily  .  feeding supplement (NEPRO CARB STEADY)  237 mL Oral BID BM  . free water  200 mL Oral Q4H  . insulin aspart  0-15 Units Subcutaneous Q4H  . latanoprost  1 drop Both Eyes QHS  . mouth rinse  15 mL Mouth Rinse BID  . methylPREDNISolone (SOLU-MEDROL) injection  10 mg Intravenous Daily  . metoprolol tartrate  25 mg Oral BID  . mycophenolate  180 mg Oral BID  . pantoprazole (PROTONIX) IV  40 mg Intravenous Daily  . tacrolimus  1 mg Oral BID  . valACYclovir  500 mg Oral BID  . cyanocobalamin  1,000 mcg Oral Daily   Infusions:  . sodium chloride Stopped (01/02/21 2107)  . heparin 550 Units/hr (01/04/21 0650)    Assessment: 65 yo female on apixaban PTA and last inpatient dose of apixaban was on 2/5 presented with perforated acute sigmoid diverticulitis s/p Hartman's on 2/6 has had oozing from wound site and anticoagulation has not been restarted as a result until today 2/11 when CCS stated could be restarted if Hgb remained stable (8.9 > 8.8). Md ordered to start IV heparin per Rx, will not give bolus due to oozing and s/p multiple blood transfusions the  last few days  Today, 01/04/2021:  HL = 0.84, SUPRAtherapeutic on heparin infusion of 700 units/hr  Confirmed with RN that heparin infusing at correct rate.   CBC: Hgb decreased to 8.1, Plt improved to 108k  RN reports no bleeding from wound site. RN reports small nose bleed from both nares.  Goal of Therapy:  Heparin level 0.3-0.5 units/ml Monitor platelets by anticoagulation protocol: Yes   Plan:   Decrease heparin IV infusion at 550 units/hr  Heparin level 6 hours after rate change  Daily heparin level and CBC  Continue to monitor H&H and platelets   Gretta Arab PharmD, BCPS Clinical Pharmacist [facility] main pharmacy [phone] 01/04/2021 7:00 AM

## 2021-01-04 NOTE — Progress Notes (Signed)
PCCM service asked to eval for IV access.  See Dr Kendell Bane note earlier : Once able from a post op perspective, would switch heparin to eliquis 5mg  BID  Pt taking po fine / she says her hands have "always bruised easily" and denies acute change now or pain in hands but would  Avoid placing PIC in RUE in this setting (fistula on L).   Her meds can all be changed to po if needed if lose the access in her R arm, so PCCM interventions can be prn.  Please call if needed    April Gully, MD Pulmonary and [place] [phone]   After 7:00 pm call Elink  [phone]   Scheduled Meds: . (feeding supplement) PROSource Plus  30 mL Oral BID BM  . amLODipine  5 mg Oral Daily  . brimonidine  1 drop Both Eyes BID  . Chlorhexidine Gluconate Cloth  6 each Topical Daily  . cycloSPORINE  1 drop Both Eyes BID  . dorzolamide  1 drop Both Eyes Daily  . feeding supplement (NEPRO CARB STEADY)  237 mL Oral BID BM  . free water  200 mL Oral Q4H  . insulin aspart  0-15 Units Subcutaneous Q4H  . latanoprost  1 drop Both Eyes QHS  . mouth rinse  15 mL Mouth Rinse BID  . methylPREDNISolone (SOLU-MEDROL) injection  10 mg Intravenous Daily  . metoprolol tartrate  25 mg Oral BID  . mycophenolate  180 mg Oral BID  . pantoprazole (PROTONIX) IV  40 mg Intravenous Daily  . tacrolimus  1 mg Oral BID  . valACYclovir  500 mg Oral BID  . cyanocobalamin  1,000 mcg Oral Daily   Continuous Infusions: . sodium chloride Stopped (01/02/21 2107)  . heparin 550 Units/hr (01/04/21 0650)   PRN Meds:.sodium chloride, acetaminophen, fentaNYL (SUBLIMAZE) injection, hydrALAZINE, lip balm, naLOXone (NARCAN)  injection, [DISCONTINUED] ondansetron **OR** ondansetron (ZOFRAN) IV, oxyCODONE, promethazine

## 2021-01-04 NOTE — Progress Notes (Signed)
PROGRESS NOTE   April [name]  JEH:[id number]    DOB: [birth year]    DOA: 12/16/2020  PCP: [name], MD   I have briefly reviewed patients previous medical records in [facility].  Chief Complaint  Patient presents with  . Abdominal Pain    Brief Narrative:  PCCM to [facility] transfer 01/02/2021: 65 year old female with medical history of severe persistent asthma, chronic respiratory failure with hypoxia on home oxygen 2 L/min as needed, ESRD s/p deceased donor renal transplant in 2013 on immunosuppression and followed at [facility], chronic diastolic CHF, MGUS, HTN, recently seen in the ER for uncomplicated sigmoid diverticulitis, returned to the hospital with abdominal pain and fever.  She was admitted to the hospital and started on antibiotics, developed atrial fibrillation with RVR.  She underwent successful TEE with [facility] cardioversion.  On the morning of 12/29/2020, patient developed acute abdominal pain and CT imaging concerning for free air within the abdomen from perforated diverticulitis. S/p Hartman's procedure, postop remained on ventilator and vasopressors.  Stabilized and care transferred to [facility].   Assessment & Plan:  Active Problems:   GERD without esophagitis   Renal transplant recipient   Depression   Sepsis ([place])   SOB (shortness of breath)   Acute diverticulitis   Sigmoid diverticulitis   Acute CHF (congestive heart failure) (HCC)   Bilious vomiting with nausea   Difficult intravenous access   Acute respiratory failure with hypoxia secondary to septic shock due to perforated diverticulitis and peritonitis complicating underlying COPD, chronic respiratory failure with hypoxia: She was managed by critical care in the ICU.  Intubated, extubated 2/8.  Shock resolved and weaned off vasopressors.  Mobilize with PT.  Incentive spirometry.  Patient states that she only uses oxygen as needed 2 L/min.  Saturation goal: 88-92%.  Completed Zosyn 2/11.  Remains on valacyclovir.   Appears to have been weaned down to 1 L/min [place] oxygen.  Perforated acute sigmoid diverticulitis s/p Hartman's on 2/6 General surgery follow-up appreciated.  Advancing diet, up to full liquids, not advancing today.  Completed IV Zosyn 2/11.  Discussed in detail with patient regarding mobilizing and she agreed.  Suspected OSA OSA suspected based on apneic episodes at night.  Per PCCM, continue CPAP for now at night while hospitalized but eventually will need sleep study as an outpatient.  Outpatient follow-up with pulmonology/Dr. Vaughan Browner is her primary pulmonologist.  Type II DM with hyperglycemia Complicated by steroids.  Off Lantus due to hypoglycemic episodes.  Reasonable control on SSI alone, monitor.  Atrial flutter (typical) with RVR, s/p TEE and DCCV on 12/25/2020/prior SVT Currently in sinus rhythm.  Apixaban was discontinued on 2/6 for her abdominal surgery.  Anticoagulation had not been resumed due to slow oozing from the wound, needed blood transfusion on 2/7 and 2/9.  As per discussion with general surgery, resumed IV heparin 2/11 and if no further bleeding/oozing, stable hemoglobin then consider switching to Eliquis on 2/13-discussed with general surgery today.  Cardiology have resumed metoprolol 25 mg twice daily. EP cardiology follow-up appreciated.  CHA2DS2-VASc score: At least 3.  Following cardioversion, she will require at least 30 days of uninterrupted [place] therapy.  Could consider eventual elective ablation to hopefully reduce need for long-term [place] therapy (they are not aware of her having a history of A. fib).  EP cardiology indicates that since she is followed by [facility] closely post transplant, she may benefit from seeing [facility] for follow-up  Acute on chronic diastolic CHF Mild third spacing but  no overt heart failure.    Acute kidney injury complicating CKD stage III a: Baseline creatinine 1.1-1.5.  AKI due to volume depletion from nausea and vomiting, hypotension, sepsis, third  spacing and acute on chronic diastolic CHF.  Serum creatinine peaked to 2.7.  Creatinine has gone up from 1.32-1.48 on 2/11 along with hypernatremia and hyperkalemia.  Nephrology signed off 2/9.  As discussed with nephrology yesterday, briefly hydrated with IV fluids.  Creatinine has plateaued at 1.48.  DC IVF.  Patient drinking liquids well.  Continue to trend daily BMP  Hypernatremia Increase free water intake by mouth and started IV D5 W.  Resolved.  Hyperkalemia Give a dose of Lokelma, change diet to low potassium diet.  Resolved  Adrenal insufficiency: Low a.m. cortisol, renin/aldosterone in process.  Blood pressures normal.  Continue Solu-Medrol 10 mg IV daily for now and eventually needs to go back on home dose of prednisone 7.5 mg daily.  Will transition to oral prednisone beginning tomorrow.  S/p renal transplant Per nephrology recommendations, continue Prograf/Myfortic.  Tacrolimus level drawn 2/4 still pending.  On Solu-Medrol taper, eventually to transition to home dose of prednisone 7.5 mg daily and should not be tapered lower than that.  We will transition to prednisone 2/13  Postop acute blood loss anemia: S/p transfusions.  Hemoglobin up to 8 g range.  Follow CBC in a.m. and transfuse if hemoglobin 7 g or less.  Hemoglobin has dropped slightly from 8.8-8.1.    Thrombocytopenia: Platelet count up to 108.  Follow CBC  Difficult peripheral IV access Per IV team, no options for peripheral IV.  PCCM consulted for central line.  No peripheral PICC or midline due to history of renal transplant and chronic kidney disease.  It appears that they were able to finally find a peripheral IV line and no central line was needed for now.  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  Bilateral toe brachial indices abnormal.  Etiology unclear.  Discussed with RN to try and keep fingers and toes socks and gloves.  Trial of low-dose amlodipine 5  mg daily.  Toes and fingers actually appeared much better this morning.  Body mass index is 33.57 kg/m.  Nutritional Status Nutrition Problem: Inadequate oral intake Etiology: inability to eat Signs/Symptoms: NPO status Interventions: Refer to RD note for recommendations    DVT prophylaxis: Place and maintain sequential compression device Start: 12/31/20 1004     Code Status: Full Code Family Communication: I discussed in detail with patient's daughter via phone on 2/11, updated care and answered questions.   Disposition:  Status is: Inpatient  Remains inpatient appropriate because:Inpatient level of care appropriate due to severity of illness   Dispo:  Patient From: Home  Planned Disposition: Home with Health Care Svc  Expected discharge date: 01/03/2021  Medically stable for discharge: No          Consultants:   General surgery Cardiology Nephrology  Procedures:   12/29/2020: Exploratory laparotomy, sigmoid colectomy and end colostomy, central venous line, arterial line. 12/31/2020: Extubated. Foley catheter-to be discontinued 2/10  Significant Hospital events: 1/24 Admit 2/2 TEE guided cardioversion 2/6 Hartman's procedure for perf diverticulitis, remains on vent post op 2/8 Extubated, PRBC for low Hb   Antimicrobials:    Anti-infectives (From admission, onward)   Start     Dose/Rate Route Frequency Ordered Stop   12/31/20 2200  valACYclovir (VALTREX) tablet 500 mg        500  mg Oral 2 times daily 12/31/20 2100     12/31/20 1000  valACYclovir (VALTREX) compounded oral suspension 500 mg  Status:  Discontinued        500 mg Per Tube 2 times daily 12/30/20 2051 12/31/20 2100   12/29/20 1045  piperacillin-tazobactam (ZOSYN) IVPB 3.375 g        3.375 g 12.5 mL/hr over 240 Minutes Intravenous Every 8 hours 12/29/20 0956 01/04/21 0152   12/24/20 0800  ciprofloxacin (CIPRO) tablet 500 mg  Status:  Discontinued        500 mg Oral Daily with breakfast 12/23/20 1721  12/23/20 1737   12/23/20 2200  metroNIDAZOLE (FLAGYL) tablet 500 mg  Status:  Discontinued        500 mg Oral Every 8 hours 12/23/20 1721 12/23/20 1737   12/23/20 2200  amoxicillin-clavulanate (AUGMENTIN) 500-125 MG per tablet 500 mg  Status:  Discontinued        1 tablet Oral 2 times daily 12/23/20 1739 12/29/20 0949   12/18/20 1100  Ampicillin-Sulbactam (UNASYN) 3 g in sodium chloride 0.9 % 100 mL IVPB  Status:  Discontinued        3 g 200 mL/hr over 30 Minutes Intravenous Every 12 hours 12/18/20 0729 12/23/20 1721   12/17/20 1400  Ampicillin-Sulbactam (UNASYN) 3 g in sodium chloride 0.9 % 100 mL IVPB  Status:  Discontinued        3 g 200 mL/hr over 30 Minutes Intravenous Every 6 hours 12/17/20 1004 12/18/20 0729   12/16/20 2200  ciprofloxacin (CIPRO) IVPB 400 mg  Status:  Discontinued        400 mg 200 mL/hr over 60 Minutes Intravenous Every 12 hours 12/16/20 1451 12/16/20 1734   12/16/20 2200  piperacillin-tazobactam (ZOSYN) IVPB 3.375 g  Status:  Discontinued        3.375 g 12.5 mL/hr over 240 Minutes Intravenous Every 8 hours 12/16/20 1734 12/17/20 0949   12/16/20 1800  metroNIDAZOLE (FLAGYL) IVPB 500 mg  Status:  Discontinued        500 mg 100 mL/hr over 60 Minutes Intravenous Every 8 hours 12/16/20 1451 12/16/20 1734   12/16/20 1515  valACYclovir (VALTREX) tablet 500 mg  Status:  Discontinued        500 mg Oral 2 times daily 12/16/20 1505 12/30/20 2048   12/16/20 0915  ciprofloxacin (CIPRO) IVPB 400 mg        400 mg 200 mL/hr over 60 Minutes Intravenous  Once 12/16/20 0912 12/16/20 1104   12/16/20 0915  metroNIDAZOLE (FLAGYL) IVPB 500 mg        500 mg 100 mL/hr over 60 Minutes Intravenous  Once 12/16/20 0912 12/16/20 1104        Subjective:  Patient reports feeling good.  Denies abdominal pain or dyspnea.  No digit pains.  Drinking fluids well.  Objective:   Vitals:   01/04/21 0500 01/04/21 0800 01/04/21 0820 01/04/21 1332  BP:  (!) 111/59    Pulse:  78    Resp:  19     Temp:   (!) 97.4 F (36.3 C) (!) 97.4 F (36.3 C)  TempSrc:   Oral   SpO2:  (!) 89%    Weight: 80.6 kg     Height:        General exam: Middle-age female, moderately built and obese sitting up comfortably in bed.  Looks much improved compared to the last 2 days.  Appears in good spirits. Respiratory system: Clear to auscultation.  No increased  work of breathing. Cardiovascular system: S1 and S2 heard, RRR.  No JVD.  Trace ankle edema.  Telemetry personally reviewed: Sinus rhythm. Gastrointestinal system: Abdomen is nondistended, soft and nontender. No organomegaly or masses felt. Normal bowel sounds heard.  Dressing over surgical site clean and dry.  Left Hartman's pouch with minimal liquids. Central nervous system: Alert and oriented. No focal neurological deficits. Extremities: Symmetric 5 x 5 power.  Bilateral toes and fingers much improved compared to yesterday, faintly dusky.  Palpable distal pulses. Skin: Fingers and toes as noted above. Psychiatry: Judgement and insight appear normal. Mood & affect appropriate.     Data Reviewed:   I have personally reviewed following labs and imaging studies   CBC: Recent Labs  Lab 12/31/20 0530 01/01/21 0457 01/01/21 1204 01/02/21 0256 01/03/21 0720 01/04/21 0556  WBC 11.6* 15.3*  --  19.3* 18.8* 14.9*  NEUTROABS 10.6* 14.5*  --  17.8*  --   --   HGB 7.6* 6.7*   < > 8.9* 8.8* 8.1*  HCT 24.0* 22.1*   < > 27.9* 29.0* 25.6*  MCV 101.3* 106.8*  --  103.3* 106.6* 102.4*  PLT 104* 109*  --  121* 96* 108*   < > = values in this interval not displayed.    Basic Metabolic Panel: Recent Labs  Lab 12/31/20 0530 12/31/20 1259 01/01/21 0457 01/02/21 0256 01/03/21 0300 01/04/21 0556  NA 139  --  138 137  135 146* 132*  K 4.2  --  4.9 4.7  4.7 5.5* 4.8  CL 104  --  104 102  101 110 98  CO2 25  --  25 24  25  18* 24  GLUCOSE 96  --  104* 165*  166* 162* 139*  BUN 76*  --  69* 64*  62* 62* 67*  CREATININE 1.41*  --  1.43* 1.37*   1.32* 1.48* 1.48*  CALCIUM 9.6  --  9.4 9.6  9.4 9.5 9.5  MG 2.2 2.0  --  2.1  --   --   PHOS 3.5  --  4.5 4.3  4.4  --  4.3    Liver Function Tests: Recent Labs  Lab 01/01/21 0457 01/02/21 0256 01/03/21 0300 01/04/21 0556  AST 12* 12* 11*  --   ALT 11 11 11   --   ALKPHOS 43 48 53  --   BILITOT 1.8* 1.6* 1.4*  --   PROT 5.0* 5.4* 5.6*  --   ALBUMIN 2.3* 2.3*  2.3* 2.3* 2.2*    CBG: Recent Labs  Lab 01/04/21 0454 01/04/21 0745 01/04/21 1228  GLUCAP 127* 118* 184*    Microbiology Studies:   No results found for this or any previous visit (from the past 240 hour(s)).   Radiology Studies:  VAS [place] ABI WITH/WO TBI  Result Date: 01/03/2021 LOWER EXTREMITY DOPPLER STUDY Indications: Dusky Toes. High Risk Factors: Hypertension, hyperlipidemia.  Limitations: Today's exam was limited due to Left restricted arm. Comparison Study: No prior studies. Performing Technologist: Carlos Levering RVT  Examination Guidelines: A complete evaluation includes at minimum, Doppler waveform signals and systolic blood pressure reading at the level of bilateral brachial, anterior tibial, and posterior tibial arteries, when vessel segments are accessible. Bilateral testing is considered an integral part of a complete examination. Photoelectric Plethysmograph (PPG) waveforms and toe systolic pressure readings are included as required and additional duplex testing as needed. Limited examinations for reoccurring indications may be performed as noted.  ABI Findings: +---------+------------------+-----+---------+--------+ Right    Rt  Pressure (mmHg)IndexWaveform Comment  +---------+------------------+-----+---------+--------+ Brachial 141                    triphasic         +---------+------------------+-----+---------+--------+ PTA      203               1.44 triphasic         +---------+------------------+-----+---------+--------+ DP       153               1.09 triphasic          +---------+------------------+-----+---------+--------+ Great Toe0                 0.00                   +---------+------------------+-----+---------+--------+ +---------+------------------+-----+---------+--------------+ Left     Lt Pressure (mmHg)IndexWaveform Comment        +---------+------------------+-----+---------+--------------+ Brachial                                 Restricted arm +---------+------------------+-----+---------+--------------+ PTA      194               1.38 triphasic               +---------+------------------+-----+---------+--------------+ DP       147               1.04 triphasic               +---------+------------------+-----+---------+--------------+ Great Toe0                 0.00                         +---------+------------------+-----+---------+--------------+ +-------+-----------+-----------+------------+------------+ ABI/TBIToday's ABIToday's TBIPrevious ABIPrevious TBI +-------+-----------+-----------+------------+------------+ Right  1.44       0                                   +-------+-----------+-----------+------------+------------+ Left   1.38       0                                   +-------+-----------+-----------+------------+------------+  Summary: Right: Resting right ankle-brachial index indicates noncompressible right lower extremity arteries. The right toe-brachial index is abnormal. Left: Resting left ankle-brachial index indicates noncompressible left lower extremity arteries. The left toe-brachial index is abnormal.  *See table(s) above for measurements and observations.     Preliminary      Scheduled Meds:   . (feeding supplement) PROSource Plus  30 mL Oral BID BM  . amLODipine  5 mg Oral Daily  . brimonidine  1 drop Both Eyes BID  . Chlorhexidine Gluconate Cloth  6 each Topical Daily  . cycloSPORINE  1 drop Both Eyes BID  . dorzolamide  1 drop Both Eyes Daily  . feeding supplement  (NEPRO CARB STEADY)  237 mL Oral BID BM  . free water  200 mL Oral Q4H  . insulin aspart  0-15 Units Subcutaneous Q4H  . latanoprost  1 drop Both Eyes QHS  . mouth rinse  15 mL Mouth Rinse BID  . methylPREDNISolone (SOLU-MEDROL) injection  10 mg Intravenous Daily  . metoprolol tartrate  25 mg Oral BID  . mycophenolate  180 mg Oral BID  . pantoprazole (PROTONIX) IV  40 mg Intravenous Daily  . tacrolimus  1 mg Oral BID  . valACYclovir  500 mg Oral BID  . cyanocobalamin  1,000 mcg Oral Daily    Continuous Infusions:   . sodium chloride Stopped (01/02/21 2107)  . heparin 400 Units/hr (01/04/21 1430)     LOS: 19 days     Vernell Leep, MD, [place], [facility]. [facility]    To contact the attending provider between 7A-7P or the covering provider during after hours 7P-7A, please log into the web site [email or web address] and access using universal [place] password for that web site. If you do not have the password, please call the hospital operator.  01/04/2021, 3:28 PM

## 2021-01-04 NOTE — Progress Notes (Signed)
Assessment & Plan: POD#5 - s/p Hartman's resection for perforated sigmoid diverticulitis - Dr. Ninfa Linden - 12/29/20  Continue on [place] diet today  Wound care - wet to dry dressing changes  needs to mobilize, PT to evaluate  pulmonary toilet - encourage IS use  abx stopped yesterday - monitor WBC - decreased to 14.9 this AM  Polycystic disease with renal transplant 2013  Prograf & prednisone. Renalfollowing CHF/uncertain etiology  Cardiology following Chronic COPD/history of tobacco use Non-occlusive deep vein thrombosis involving the right popliteal vein, age indeterminate Hx SVT Atrial flutter with RVR  Rx with Eliquis, currently on hold. May resume heparin gtts per medical team.        [name], [facility]       Office: [phone]   Chief Complaint: Perforated sigmoid diverticulitis  Subjective: Patient in bed, awake and alert.  No complaints.  Taking some po full liquids.  Objective: Vital signs in last 24 hours: Temp:  [97.6 F (36.4 C)-98.4 F (36.9 C)] 97.6 F (36.4 C) (02/12 0458) Pulse Rate:  [65-94] 65 (02/11 2352) Resp:  [11-18] 14 (02/12 0232) BP: (104-199)/(46-182) 140/46 (02/11 2352) SpO2:  [98 %-100 %] 98 % (02/12 0232) Weight:  [80.6 kg] 80.6 kg (02/12 0500) Last BM Date: 01/02/21  Intake/Output from previous day: 02/11 0701 - 02/12 0700 In: 1517.9 [I.V.:976.7; NG/GT:400; IV Piggyback:141.1] Out: 450 [Urine:450] Intake/Output this shift: No intake/output data recorded.  Physical Exam: HEENT - sclerae clear, mucous membranes moist Neck - soft Abdomen - soft, midline dressing dry and intact, no sign of bleeding; stoma viable, minimal output in bag Neuro - alert & oriented, no focal deficits  Lab Results:  Recent Labs    01/03/21 0720 01/04/21 0556  WBC 18.8* 14.9*  HGB 8.8* 8.1*  HCT 29.0* 25.6*  PLT 96* 108*   BMET Recent Labs    01/03/21 0300 01/04/21 0556  NA 146* 132*  K 5.5* 4.8  CL 110  98  CO2 18* 24  GLUCOSE 162* 139*  BUN 62* 67*  CREATININE 1.48* 1.48*  CALCIUM 9.5 9.5   PT/INR No results for input(s): LABPROT, INR in the last 72 hours. Comprehensive Metabolic Panel:    Component Value Date/Time   NA 132 (L) 01/04/2021 0556   NA 146 (H) 01/03/2021 0300   K 4.8 01/04/2021 0556   K 5.5 (H) 01/03/2021 0300   CL 98 01/04/2021 0556   CL 110 01/03/2021 0300   CO2 24 01/04/2021 0556   CO2 18 (L) 01/03/2021 0300   BUN 67 (H) 01/04/2021 0556   BUN 62 (H) 01/03/2021 0300   CREATININE 1.48 (H) 01/04/2021 0556   CREATININE 1.48 (H) 01/03/2021 0300   CREATININE 1.28 (H) 11/04/2020 0952   CREATININE 1.30 (H) 08/05/2020 1523   GLUCOSE 139 (H) 01/04/2021 0556   GLUCOSE 162 (H) 01/03/2021 0300   CALCIUM 9.5 01/04/2021 0556   CALCIUM 9.5 01/03/2021 0300   CALCIUM 10.8 (H) 10/13/2011 1405   AST 11 (L) 01/03/2021 0300   AST 12 (L) 01/02/2021 0256   AST 13 (L) 11/04/2020 0952   AST 10 (L) 08/05/2020 1523   ALT 11 01/03/2021 0300   ALT 11 01/02/2021 0256   ALT 12 11/04/2020 0952   ALT <6 08/05/2020 1523   ALKPHOS 53 01/03/2021 0300   ALKPHOS 48 01/02/2021 0256   BILITOT 1.4 (H) 01/03/2021 0300   BILITOT 1.6 (H) 01/02/2021 0256   BILITOT 0.6  11/04/2020 0952   BILITOT 0.6 08/05/2020 1523   PROT 5.6 (L) 01/03/2021 0300   PROT 5.4 (L) 01/02/2021 0256   ALBUMIN 2.2 (L) 01/04/2021 0556   ALBUMIN 2.3 (L) 01/03/2021 0300    Studies/Results: VAS [place] ABI WITH/WO TBI  Result Date: 01/03/2021 LOWER EXTREMITY DOPPLER STUDY Indications: Dusky Toes. High Risk Factors: Hypertension, hyperlipidemia.  Limitations: Today's exam was limited due to Left restricted arm. Comparison Study: No prior studies. Performing Technologist: Carlos Levering RVT  Examination Guidelines: A complete evaluation includes at minimum, Doppler waveform signals and systolic blood pressure reading at the level of bilateral brachial, anterior tibial, and posterior tibial arteries, when vessel segments are  accessible. Bilateral testing is considered an integral part of a complete examination. Photoelectric Plethysmograph (PPG) waveforms and toe systolic pressure readings are included as required and additional duplex testing as needed. Limited examinations for reoccurring indications may be performed as noted.  ABI Findings: +---------+------------------+-----+---------+--------+ Right    Rt Pressure (mmHg)IndexWaveform Comment  +---------+------------------+-----+---------+--------+ Brachial 141                    triphasic         +---------+------------------+-----+---------+--------+ PTA      203               1.44 triphasic         +---------+------------------+-----+---------+--------+ DP       153               1.09 triphasic         +---------+------------------+-----+---------+--------+ Great Toe0                 0.00                   +---------+------------------+-----+---------+--------+ +---------+------------------+-----+---------+--------------+ Left     Lt Pressure (mmHg)IndexWaveform Comment        +---------+------------------+-----+---------+--------------+ Brachial                                 Restricted arm +---------+------------------+-----+---------+--------------+ PTA      194               1.38 triphasic               +---------+------------------+-----+---------+--------------+ DP       147               1.04 triphasic               +---------+------------------+-----+---------+--------------+ Great Toe0                 0.00                         +---------+------------------+-----+---------+--------------+ +-------+-----------+-----------+------------+------------+ ABI/TBIToday's ABIToday's TBIPrevious ABIPrevious TBI +-------+-----------+-----------+------------+------------+ Right  1.44       0                                   +-------+-----------+-----------+------------+------------+ Left   1.38       0                                    +-------+-----------+-----------+------------+------------+  Summary: Right: Resting right ankle-brachial index indicates noncompressible right lower extremity arteries. The right toe-brachial index is abnormal. Left: Resting  left ankle-brachial index indicates noncompressible left lower extremity arteries. The left toe-brachial index is abnormal.  *See table(s) above for measurements and observations.     Preliminary       [name] 01/04/2021  Patient ID: April [name], female   DOB: [birth year], 65 y.o.   MRN: [id number]

## 2021-01-04 NOTE — Progress Notes (Signed)
Patient had small nose bleed from both nares. Did not saturate more than few tissues. Will continue to monitor for bleeding while on heparin. Educated patient about not picking her nose.

## 2021-01-04 NOTE — Progress Notes (Signed)
Occupational Therapy Treatment Patient Details Name: April [name] MRN: [id number] DOB: 08-10-1956 Today's Date: 01/04/2021    History of present illness April [name] is a 65 y.o. 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 Admitted to the hospital started on antibiotics developed atrial fibrillation with RVR.  Had a successful TEE with [facility] cardioversion.  On the morning of 12/29/2020 patient developed acute abdominal pain had CT imaging that was concerning for free air within the belly and a perforated diverticulitis s/p Hartman's and intubation on 2/6. Extubated on 2/8.   OT comments  Treatment focused on promoting any activity to prevent increasing weakness and debility. Patient supine in bed drowsy - phenergan having been administered. Patient's upper extremities more swollen than on evaluation and RUE weeping. Patient max x 2 to transfer to side of bed and exhibited right lateral lean throughout edge of bed sitting. Patient able to briefly kick legs, mostly with assistance from therapist and PROM for upper extremity movement. Total assist to return to supine and position in bed. Patient limited by lethargy and pain. Patient will continue to benefit from skilled OT services while in hospital to improve impairments and progress towards goals.   Follow Up Recommendations  SNF    Equipment Recommendations  None recommended by OT    Recommendations for Other Services      Precautions / Restrictions Precautions Precautions: Fall Precaution Comments: hx of falling, hip surgery, LLQ colostomy Restrictions Weight Bearing Restrictions: No       Mobility Bed Mobility Overal bed mobility: Needs Assistance Bed Mobility: Supine to Sit;Sit to Supine     Supine to sit: Max assist;HOB elevated;+2 for physical assistance;+2 for  safety/equipment Sit to supine: Total assist;+2 for safety/equipment;+2 for physical assistance   General bed mobility comments: Max assist x 2 to manage all limbs and weight of patient and use of bed pad to pivot to edge of bed. Total assist to return to supine with no assistance from patient. patient complaining of more pain on return to supine. Pain reduced with supine position.  Transfers                      Balance Overall balance assessment: Needs assistance Sitting-balance support: Single extremity supported Sitting balance-Leahy Scale: Poor Sitting balance - Comments: heavy lean to the right and external assistance                                   ADL either performed or assessed with clinical judgement   ADL                                               Vision       Perception     Praxis      Cognition Arousal/Alertness: Lethargic;Suspect due to medications Behavior During Therapy: [facility] for tasks assessed/performed Overall Cognitive Status: Within Functional Limits for tasks assessed                                          Exercises  Shoulder Instructions       General Comments      Pertinent Vitals/ Pain       Pain Assessment: Faces Faces Pain Scale: Hurts even more Pain Location: abdomen Pain Descriptors / Indicators: Grimacing;Guarding Pain Intervention(s): Limited activity within patient's tolerance;Monitored during session  Home Living                                          Prior Functioning/Environment              Frequency  Min 2X/week        Progress Toward Goals  OT Goals(current goals can now be found in the care plan section)  Progress towards OT goals: OT to reassess next treatment  Acute Rehab OT Goals Patient Stated Goal: To go home OT Goal Formulation: With patient Time For Goal Achievement: 01/16/21 Potential to Achieve Goals: [place] Discharge plan remains appropriate    Co-evaluation                 AM-PAC OT "6 Clicks" Daily Activity     Outcome Measure   Help from another person eating meals?: A Little Help from another person taking care of personal grooming?: A Little Help from another person toileting, which includes using toliet, bedpan, or urinal?: Total Help from another person bathing (including washing, rinsing, drying)?: A Lot Help from another person to put on and taking off regular upper body clothing?: A Lot Help from another person to put on and taking off regular lower body clothing?: Total 6 Click Score: 12    End of Session    OT Visit Diagnosis: Other abnormalities of gait and mobility (R26.89);Muscle weakness (generalized) (M62.81);History of falling (Z91.81);Pain   Activity Tolerance Patient limited by fatigue;Patient limited by pain;Patient limited by lethargy   Patient Left in bed;with call bell/phone within reach;with family/visitor present   Nurse Communication Mobility status        Time: 1440-1500 OT Time Calculation (min): 20 min  Charges: OT General Charges $OT Visit: 1 Visit OT Treatments $Therapeutic Activity: 8-22 mins  Derl Barrow, OTR/L [place]  Office [phone] Pager: [place] 01/04/2021, 3:18 PM

## 2021-01-04 NOTE — Progress Notes (Signed)
ANTICOAGULATION CONSULT NOTE - Follow Up Consult  Pharmacy Consult for Heparin Indication: atrial fibrillation  Allergies  Allergen Reactions  . Infed [Iron Dextran] Other (See Comments)    Chest tightness  . Pentamidine Itching, Shortness Of Breath and Swelling  . Budesonide-Formoterol Fumarate     Other reaction(s): hoarseness  . Bupropion     Other reaction(s): exacerbated depression  . Crestor [Rosuvastatin]     Other reaction(s): body aches, swelling  . Duloxetine Hcl     Other reaction(s): GI SE, weepy, worsening fatigue, foggy  . Erythromycin [Erythromycin] Other (See Comments)    Mouth Ulcers  . Iohexol Other (See Comments)    Per patient "she has had a kidney transplant and should never have contrast"  . Oxycodone Nausea Only and Nausea And Vomiting  . Pravastatin Sodium     Other reaction(s): myalgias  . Erythromycin Rash    Causes breakout in mouth  . Ultram [Tramadol Hcl] Anxiety    Patient Measurements: Height: 5\' 1"  (154.9 cm) Weight: 80.6 kg (177 lb 11.1 oz) IBW/kg (Calculated) : 47.8 Heparin Dosing Weight: 65 kg  Vital Signs: Temp: 97.8 F (36.6 C) (02/12 2000) Temp Source: Axillary (02/12 2000) BP: 127/100 (02/12 2000) Pulse Rate: 72 (02/12 2000)  Labs: Recent Labs    01/02/21 0256 01/03/21 0300 01/03/21 0720 01/03/21 1939 01/04/21 0556 01/04/21 1315 01/04/21 2120  HGB 8.9*  --  8.8*  --  8.1*  --   --   HCT 27.9*  --  29.0*  --  25.6*  --   --   PLT 121*  --  96*  --  108*  --   --   HEPARINUNFRC  --   --   --    < > 0.84* 0.78* 0.72*  CREATININE 1.37*  1.32* 1.48*  --   --  1.48*  --   --    < > = values in this interval not displayed.    Estimated Creatinine Clearance: 37.4 mL/min (A) (by C-G formula based on SCr of 1.48 mg/dL (H)).   Medications:  Scheduled:  . (feeding supplement) PROSource Plus  30 mL Oral BID BM  . amLODipine  5 mg Oral Daily  . brimonidine  1 drop Both Eyes BID  . Chlorhexidine Gluconate Cloth  6 each  Topical Daily  . cycloSPORINE  1 drop Both Eyes BID  . dorzolamide  1 drop Both Eyes Daily  . feeding supplement (NEPRO CARB STEADY)  237 mL Oral BID BM  . free water  200 mL Oral Q4H  . insulin aspart  0-15 Units Subcutaneous Q4H  . latanoprost  1 drop Both Eyes QHS  . mouth rinse  15 mL Mouth Rinse BID  . metoprolol tartrate  25 mg Oral BID  . mycophenolate  180 mg Oral BID  . [START ON 01/05/2021] pantoprazole  40 mg Oral Daily  . [START ON 01/05/2021] predniSONE  7.5 mg Oral Q breakfast  . tacrolimus  1 mg Oral BID  . valACYclovir  500 mg Oral BID  . cyanocobalamin  1,000 mcg Oral Daily   Infusions:  . sodium chloride Stopped (01/02/21 2107)  . heparin 400 Units/hr (01/04/21 2014)    Assessment: 65 yo female on apixaban PTA and last inpatient dose of apixaban was on 2/5 presented with perforated acute sigmoid diverticulitis s/p Hartman's on 2/6 has had oozing from wound site and anticoagulation has not been restarted as a result until today 2/11 when CCS stated could be restarted  if Hgb remained stable (8.9 > 8.8). Md ordered to start IV heparin per Rx, will not give bolus due to oozing and s/p multiple blood transfusions the last few days  Today, 01/04/2021:  HL improved but still SUPRAtherapeutic on heparin infusion despite decreasing the rate from 550 to 400 units/hr  Confirmed with RN that heparin infusing at correct rate.   RN reports no bleeding   Goal of Therapy:  Heparin level 0.3-0.5 units/ml Monitor platelets by anticoagulation protocol: Yes   Plan:   Decrease heparin IV infusion to 350 units/hr  Heparin level 6 hours after rate change  Daily heparin level and CBC  Continue to monitor H&H and platelets   Dolly Rias RPh 01/04/2021, 10:53 PM

## 2021-01-04 NOTE — Progress Notes (Signed)
Progress Note   Subjective   Doing well today, the patient denies CP or SOB.  No new concerns  Inpatient Medications    Scheduled Meds: . (feeding supplement) PROSource Plus  30 mL Oral BID BM  . amLODipine  5 mg Oral Daily  . brimonidine  1 drop Both Eyes BID  . Chlorhexidine Gluconate Cloth  6 each Topical Daily  . cycloSPORINE  1 drop Both Eyes BID  . dorzolamide  1 drop Both Eyes Daily  . feeding supplement (NEPRO CARB STEADY)  237 mL Oral BID BM  . free water  200 mL Oral Q4H  . insulin aspart  0-15 Units Subcutaneous Q4H  . latanoprost  1 drop Both Eyes QHS  . mouth rinse  15 mL Mouth Rinse BID  . methylPREDNISolone (SOLU-MEDROL) injection  10 mg Intravenous Daily  . metoprolol tartrate  25 mg Oral BID  . mycophenolate  180 mg Oral BID  . pantoprazole (PROTONIX) IV  40 mg Intravenous Daily  . tacrolimus  1 mg Oral BID  . valACYclovir  500 mg Oral BID  . cyanocobalamin  1,000 mcg Oral Daily   Continuous Infusions: . sodium chloride Stopped (01/02/21 2107)  . heparin 550 Units/hr (01/04/21 0650)   PRN Meds: sodium chloride, acetaminophen, fentaNYL (SUBLIMAZE) injection, hydrALAZINE, lip balm, naLOXone (NARCAN)  injection, [DISCONTINUED] ondansetron **OR** ondansetron (ZOFRAN) IV, oxyCODONE, promethazine   Vital Signs    Vitals:   01/04/21 0232 01/04/21 0458 01/04/21 0500 01/04/21 0820  BP:      Pulse:      Resp: 14     Temp:  97.6 F (36.4 C)  (!) 97.4 F (36.3 C)  TempSrc:  Oral  Oral  SpO2: 98%     Weight:   80.6 kg   Height:        Intake/Output Summary (Last 24 hours) at 01/04/2021 0929 Last data filed at 01/04/2021 0650 Gross per 24 hour  Intake 1517.87 ml  Output 450 ml  Net 1067.87 ml   Filed Weights   12/28/20 0500 12/29/20 0500 01/04/21 0500  Weight: 77 kg 77.2 kg 80.6 kg    Telemetry    sinus - Personally Reviewed  Physical Exam   GEN- The patient is well appearing, alert and oriented x 3 today.   Head- normocephalic,  atraumatic Eyes-  Sclera clear, conjunctiva pink Ears- hearing intact Neck- supple, Lungs-  normal work of breathing Heart- Regular rate and rhythm  GI- soft  Extremities- no clubbing, cyanosis, or edema      Labs    Chemistry Recent Labs  Lab 01/01/21 0457 01/02/21 0256 01/03/21 0300 01/04/21 0556  NA 138 137  135 146* 132*  K 4.9 4.7  4.7 5.5* 4.8  CL 104 102  101 110 98  CO2 25 24  25  18* 24  GLUCOSE 104* 165*  166* 162* 139*  BUN 69* 64*  62* 62* 67*  CREATININE 1.43* 1.37*  1.32* 1.48* 1.48*  CALCIUM 9.4 9.6  9.4 9.5 9.5  PROT 5.0* 5.4* 5.6*  --   ALBUMIN 2.3* 2.3*  2.3* 2.3* 2.2*  AST 12* 12* 11*  --   ALT 11 11 11   --   ALKPHOS 43 48 53  --   BILITOT 1.8* 1.6* 1.4*  --   GFRNONAA 41* 43*  45* 40* 40*  ANIONGAP 9 11  9  18* 10     Hematology Recent Labs  Lab 01/02/21 0256 01/03/21 0720 01/04/21 0556  WBC 19.3* 18.8*  14.9*  RBC 2.70* 2.72* 2.50*  HGB 8.9* 8.8* 8.1*  HCT 27.9* 29.0* 25.6*  MCV 103.3* 106.6* 102.4*  MCH 33.0 32.4 32.4  MCHC 31.9 30.3 31.6  RDW 20.0* 19.4* 18.6*  PLT 121* 96* 108*     Patient ID   65 y.o. female with a hx of HTN, GERD, COPD, MGUS, HFpEF,SVT 01/2020,ESRD with renal transplant 2013 on immunosuppressants followed at [facility] admitted 1.24.22 with diverticulitis after failing outpt therapy. Initially followed by cardiology for hypotension, however now with new onset atrial flutter with RVR.   Assessment & Plan    1.  Atrial flutter (typical) Now back in sinus chads2vasc score is at least 3 Following cardioversion, she will require at least 30 days of uninterrupted [place] therapy Once able from a post op perspective, would switch heparin to eliquis 5mg  BID Could consider eventual elective ablation to hopefully reduce need of long term [place] therapy (she does not have history of afib that I am aware of).  2. Prior SVT Could consider outpatient ablation (as above) As she has been followed at [facility] closely post  transplant, may benefit from seeing [facility] EP for this.  3. Acute on chronic diastolic dysfunction Improved Keep Is and Os close to even   Cardiology to see again on Monday  Thompson Grayer MD, [facility] 01/04/2021 9:29 AM

## 2021-01-04 NOTE — Progress Notes (Signed)
ANTICOAGULATION CONSULT NOTE - Follow Up Consult  Pharmacy Consult for Heparin Indication: atrial fibrillation  Allergies  Allergen Reactions  . Infed [Iron Dextran] Other (See Comments)    Chest tightness  . Pentamidine Itching, Shortness Of Breath and Swelling  . Budesonide-Formoterol Fumarate     Other reaction(s): hoarseness  . Bupropion     Other reaction(s): exacerbated depression  . Crestor [Rosuvastatin]     Other reaction(s): body aches, swelling  . Duloxetine Hcl     Other reaction(s): GI SE, weepy, worsening fatigue, foggy  . Erythromycin [Erythromycin] Other (See Comments)    Mouth Ulcers  . Iohexol Other (See Comments)    Per patient "she has had a kidney transplant and should never have contrast"  . Oxycodone Nausea Only and Nausea And Vomiting  . Pravastatin Sodium     Other reaction(s): myalgias  . Erythromycin Rash    Causes breakout in mouth  . Ultram [Tramadol Hcl] Anxiety    Patient Measurements: Height: 5\' 1"  (154.9 cm) Weight: 80.6 kg (177 lb 11.1 oz) IBW/kg (Calculated) : 47.8 Heparin Dosing Weight: 65 kg  Vital Signs: Temp: 97.4 F (36.3 C) (02/12 1332) Temp Source: Oral (02/12 0820) BP: 111/59 (02/12 0800) Pulse Rate: 78 (02/12 0800)  Labs: Recent Labs    01/02/21 0256 01/03/21 0300 01/03/21 0720 01/03/21 1939 01/04/21 0556 01/04/21 1315  HGB 8.9*  --  8.8*  --  8.1*  --   HCT 27.9*  --  29.0*  --  25.6*  --   PLT 121*  --  96*  --  108*  --   HEPARINUNFRC  --   --   --  1.00* 0.84* 0.78*  CREATININE 1.37*  1.32* 1.48*  --   --  1.48*  --     Estimated Creatinine Clearance: 37.4 mL/min (A) (by C-G formula based on SCr of 1.48 mg/dL (H)).   Medications:  Scheduled:  . (feeding supplement) PROSource Plus  30 mL Oral BID BM  . amLODipine  5 mg Oral Daily  . brimonidine  1 drop Both Eyes BID  . Chlorhexidine Gluconate Cloth  6 each Topical Daily  . cycloSPORINE  1 drop Both Eyes BID  . dorzolamide  1 drop Both Eyes Daily  .  feeding supplement (NEPRO CARB STEADY)  237 mL Oral BID BM  . free water  200 mL Oral Q4H  . insulin aspart  0-15 Units Subcutaneous Q4H  . latanoprost  1 drop Both Eyes QHS  . mouth rinse  15 mL Mouth Rinse BID  . methylPREDNISolone (SOLU-MEDROL) injection  10 mg Intravenous Daily  . metoprolol tartrate  25 mg Oral BID  . mycophenolate  180 mg Oral BID  . pantoprazole (PROTONIX) IV  40 mg Intravenous Daily  . tacrolimus  1 mg Oral BID  . valACYclovir  500 mg Oral BID  . cyanocobalamin  1,000 mcg Oral Daily   Infusions:  . sodium chloride Stopped (01/02/21 2107)  . heparin 550 Units/hr (01/04/21 0650)    Assessment: 65 yo female on apixaban PTA and last inpatient dose of apixaban was on 2/5 presented with perforated acute sigmoid diverticulitis s/p Hartman's on 2/6 has had oozing from wound site and anticoagulation has not been restarted as a result until today 2/11 when CCS stated could be restarted if Hgb remained stable (8.9 > 8.8). Md ordered to start IV heparin per Rx, will not give bolus due to oozing and s/p multiple blood transfusions the last few days  Today, 01/04/2021:  HL improved but still SUPRAtherapeutic on heparin infusion despite decreasing the rate from 700 units/hr to 550 units/hr  Confirmed with RN that heparin infusing at correct rate.   CBC: Hgb decreased to 8.1, Plt improved to 108k  RN reports no bleeding from wound site. RN reports small nose bleed from both nares.  Goal of Therapy:  Heparin level 0.3-0.5 units/ml Monitor platelets by anticoagulation protocol: Yes   Plan:   Decrease heparin IV infusion from 550 to 400 units/hr  Heparin level 6 hours after rate change  Daily heparin level and CBC  Continue to monitor H&H and platelets   Adrian Saran, PharmD, BCPS 01/04/2021 2:12 PM

## 2021-01-05 DIAGNOSIS — N179 Acute kidney failure, unspecified: Secondary | ICD-10-CM | POA: Diagnosis not present

## 2021-01-05 DIAGNOSIS — I483 Typical atrial flutter: Secondary | ICD-10-CM | POA: Diagnosis not present

## 2021-01-05 DIAGNOSIS — I5031 Acute diastolic (congestive) heart failure: Secondary | ICD-10-CM | POA: Diagnosis not present

## 2021-01-05 LAB — CBC
HCT: 23.7 % — ABNORMAL LOW (ref 36.0–46.0)
HCT: 27.6 % — ABNORMAL LOW (ref 36.0–46.0)
Hemoglobin: 7.4 g/dL — ABNORMAL LOW (ref 12.0–15.0)
Hemoglobin: 8.2 g/dL — ABNORMAL LOW (ref 12.0–15.0)
MCH: 32.5 pg (ref 26.0–34.0)
MCH: 32.3 pg (ref 26.0–34.0)
MCHC: 31.2 g/dL (ref 30.0–36.0)
MCHC: 29.7 g/dL — ABNORMAL LOW (ref 30.0–36.0)
MCV: 103.9 fL — ABNORMAL HIGH (ref 80.0–100.0)
MCV: 108.7 fL — ABNORMAL HIGH (ref 80.0–100.0)
Platelets: 93 10*3/uL — ABNORMAL LOW (ref 150–400)
Platelets: 120 10*3/uL — ABNORMAL LOW (ref 150–400)
RBC: 2.28 MIL/uL — ABNORMAL LOW (ref 3.87–5.11)
RBC: 2.54 MIL/uL — ABNORMAL LOW (ref 3.87–5.11)
RDW: 18.6 % — ABNORMAL HIGH (ref 11.5–15.5)
RDW: 18.6 % — ABNORMAL HIGH (ref 11.5–15.5)
WBC: 10.7 10*3/uL — ABNORMAL HIGH (ref 4.0–10.5)
WBC: 12.8 10*3/uL — ABNORMAL HIGH (ref 4.0–10.5)
nRBC: 0 % (ref 0.0–0.2)
nRBC: 0 % (ref 0.0–0.2)

## 2021-01-05 LAB — GLUCOSE, CAPILLARY
Glucose-Capillary: 154 mg/dL — ABNORMAL HIGH (ref 70–99)
Glucose-Capillary: 109 mg/dL — ABNORMAL HIGH (ref 70–99)
Glucose-Capillary: 130 mg/dL — ABNORMAL HIGH (ref 70–99)
Glucose-Capillary: 240 mg/dL — ABNORMAL HIGH (ref 70–99)
Glucose-Capillary: 231 mg/dL — ABNORMAL HIGH (ref 70–99)

## 2021-01-05 LAB — RENAL FUNCTION PANEL
Albumin: 2.1 g/dL — ABNORMAL LOW (ref 3.5–5.0)
Anion gap: 9 (ref 5–15)
BUN: 77 mg/dL — ABNORMAL HIGH (ref 8–23)
CO2: 24 mmol/L (ref 22–32)
Calcium: 9.2 mg/dL (ref 8.9–10.3)
Chloride: 101 mmol/L (ref 98–111)
Creatinine, Ser: 1.84 mg/dL — ABNORMAL HIGH (ref 0.44–1.00)
GFR, Estimated: 30 mL/min — ABNORMAL LOW (ref >60)
Glucose, Bld: 144 mg/dL — ABNORMAL HIGH (ref 70–99)
Phosphorus: 4.2 mg/dL (ref 2.5–4.6)
Potassium: 4.6 mmol/L (ref 3.5–5.1)
Sodium: 134 mmol/L — ABNORMAL LOW (ref 135–145)

## 2021-01-05 LAB — HEPARIN LEVEL (UNFRACTIONATED)
Heparin Unfractionated: 0.67 IU/mL (ref 0.30–0.70)
Heparin Unfractionated: 0.55 IU/mL (ref 0.30–0.70)

## 2021-01-05 LAB — TYPE AND SCREEN
ABO/RH(D): A NEG
Antibody Screen: NEGATIVE

## 2021-01-05 MED ORDER — INSULIN ASPART 100 UNIT/ML ~~LOC~~ SOLN
0.0000 [IU] | Freq: Three times a day (TID) | SUBCUTANEOUS | Status: DC
  Administered 2021-01-05: 3 [IU] via SUBCUTANEOUS
  Administered 2021-01-06 (×3): 2 [IU] via SUBCUTANEOUS
  Administered 2021-01-07 (×2): 3 [IU] via SUBCUTANEOUS
  Administered 2021-01-08: 2 [IU] via SUBCUTANEOUS
  Administered 2021-01-08 – 2021-01-09 (×2): 3 [IU] via SUBCUTANEOUS
  Administered 2021-01-09: 7 [IU] via SUBCUTANEOUS
  Administered 2021-01-10: 2 [IU] via SUBCUTANEOUS
  Administered 2021-01-11 (×2): 3 [IU] via SUBCUTANEOUS
  Administered 2021-01-11: 2 [IU] via SUBCUTANEOUS
  Administered 2021-01-12: 3 [IU] via SUBCUTANEOUS
  Administered 2021-01-12 – 2021-01-13 (×3): 2 [IU] via SUBCUTANEOUS
  Administered 2021-01-13: 1 [IU] via SUBCUTANEOUS
  Administered 2021-01-13: 2 [IU] via SUBCUTANEOUS
  Administered 2021-01-14 (×2): 3 [IU] via SUBCUTANEOUS
  Administered 2021-01-14: 2 [IU] via SUBCUTANEOUS

## 2021-01-05 MED ORDER — FUROSEMIDE 10 MG/ML IJ SOLN
60.0000 mg | Freq: Two times a day (BID) | INTRAMUSCULAR | Status: DC
  Administered 2021-01-05 – 2021-01-07 (×4): 60 mg via INTRAVENOUS
  Filled 2021-01-05 (×4): qty 6

## 2021-01-05 MED ORDER — INSULIN ASPART 100 UNIT/ML ~~LOC~~ SOLN
0.0000 [IU] | Freq: Every day | SUBCUTANEOUS | Status: DC
  Administered 2021-01-05 – 2021-01-10 (×3): 2 [IU] via SUBCUTANEOUS

## 2021-01-05 NOTE — Progress Notes (Signed)
Physical Therapy Treatment Patient Details Name: April [name] MRN: [id number] DOB: [birth year] Today's Date: 01/05/2021    History of Present Illness April [name] is a 65 y.o. 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 Admitted to the hospital started on antibiotics developed atrial fibrillation with RVR.  Had a successful TEE with [facility] cardioversion.  On the morning of 12/29/2020 patient developed acute abdominal pain had CT imaging that was concerning for free air within the belly and a perforated diverticulitis s/p Hartman's and intubation on 2/6. Extubated on 2/8.    PT Comments    Pt requiring increased time but decreased level of assist to move supine<>sit and attempted standing but unable to achieve 2* limited WB tolerance on LEs 2* knee pain.  Pt returned to supine and RN advises will use lift equipment later this date to move to recliner.   Follow Up Recommendations  SNF;Home health PT     Equipment Recommendations  None recommended by PT    Recommendations for Other Services       Precautions / Restrictions Precautions Precautions: Fall Precaution Comments: hx of falling, hip surgery, LLQ colostomy Restrictions Weight Bearing Restrictions: No    Mobility  Bed Mobility Overal bed mobility: Needs Assistance Bed Mobility: Rolling;Sidelying to Sit;Sit to Sidelying Rolling: Mod assist Sidelying to sit: Mod assist Supine to sit: Mod assist;+2 for physical assistance;+2 for safety/equipment     General bed mobility comments: Increased time with cues for log roll technique and physical assist to manage LEs and to control trunk    Transfers Overall transfer level: Needs assistance Equipment used: Rolling walker (2 wheeled) Transfers: Sit to/from Stand Sit to Stand: Mod assist;+2 physical assistance;+2  safety/equipment;From elevated surface         General transfer comment: Standing attempted with use of RW but pt toleraing only min WB on LEs 2* knee pain and returned to sitting  Ambulation/Gait                 Stairs             Wheelchair Mobility    Modified Rankin (Stroke Patients Only)       Balance Overall balance assessment: Needs assistance Sitting-balance support: No upper extremity supported;Feet supported Sitting balance-Leahy Scale: Fair                                      Cognition Arousal/Alertness: Awake/alert Behavior During Therapy: WFL for tasks assessed/performed Overall Cognitive Status: Within Functional Limits for tasks assessed                                        Exercises General Exercises - Lower Extremity Ankle Circles/Pumps: AROM;Both;15 reps;Supine    General Comments        Pertinent Vitals/Pain Pain Assessment: Faces Faces Pain Scale: Hurts whole lot Pain Location: bilat knees with attempt to WB Pain Descriptors / Indicators: Grimacing;Guarding;Moaning Pain Intervention(s): Limited activity within patient's tolerance;Monitored during session;Premedicated before session    Home Living                      Prior Function  PT Goals (current goals can now be found in the care plan section) Acute Rehab PT Goals Patient Stated Goal: To go home PT Goal Formulation: With patient Time For Goal Achievement: 01/16/21 Potential to Achieve Goals: Fair Progress towards PT goals: Progressing toward goals    Frequency    Min 3X/week      PT Plan Current plan remains appropriate    Co-evaluation              AM-PAC PT "6 Clicks" Mobility   Outcome Measure  Help needed turning from your back to your side while in a flat bed without using bedrails?: A Lot Help needed moving from lying on your back to sitting on the side of a flat bed without using  bedrails?: A Lot Help needed moving to and from a bed to a chair (including a wheelchair)?: Total Help needed standing up from a chair using your arms (e.g., wheelchair or bedside chair)?: Total Help needed to walk in hospital room?: Total Help needed climbing 3-5 steps with a railing? : Total 6 Click Score: 8    End of Session Equipment Utilized During Treatment: Oxygen Activity Tolerance: Patient limited by fatigue;Patient limited by pain Patient left: in bed;with call bell/phone within reach Nurse Communication: Mobility status PT Visit Diagnosis: Muscle weakness (generalized) (M62.81);Pain     Time: 2633-3545 PT Time Calculation (min) (ACUTE ONLY): 33 min  Charges:  $Therapeutic Activity: 23-37 mins                     [place] Pager [phone] Office [phone]    [facility] 01/05/2021, 4:58 PM

## 2021-01-05 NOTE — Progress Notes (Signed)
[facility] Progress Note  Subjective: seen in ICU, creat up to 1.8 today . Asked to see for renal failure by attending.   Vitals:   01/05/21 1200 01/05/21 1300 01/05/21 1335 01/05/21 1400  BP:      Pulse: 64 63  71  Resp: 17 (!) 21  20  Temp:   (!) 97.4 F (36.3 C)   TempSrc:   Axillary   SpO2: 100% 100%  100%  Weight:      Height:        Exam: Gen: lethargic, chron ill appearing, pleasant CVS:RRR Resp: soft rales L base > R Abd: bandages not removed, L sided ostomy bag Ext: worsening diffuse pitting LE/ abd wall > UE edema Neuro: alert, Ox 3    Assessment/Plan: 1. AKI on CKD stage 3a- b/l creatinine 1.1- 1.5. Initial AKI episode was d/t vol depletion/ 3rd spacing/ hypotension/ sepsis. Creat peaked at 2.9 then improved back to baseline. Now creat up 1.8 today. On exam mild rales and diffuse edema of the extremities which is new.Wt's are up. Will start IV lasix 60 bid. Long hx of diast CHF. Get bladder scan as well, r/o retention. Will follow.  2. 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  3. Adrenal insufficiency.Low am cortisol. Back on home pred 7.5 qd.  4. Atrial flutter with RVR- sp DCCV here, on metoprolol 25 bid 5. HTN - getting norvasc + metoprolol 25 bid, BP's up w/ vol overload. Plan diuresis.  6. A/C diast CHF: as above 7. S/p kidney transplant- is getting usual Tx meds w/ prograf/ prednisone and myfortic.  8. COPD- per PCCM   Kelly Splinter, MD 12/31/2020, 3:09 PM   Recent Labs  Lab 01/04/21 0556 01/05/21 0511 01/05/21 1202  K 4.8 4.6  --   BUN 67* 77*  --   CREATININE 1.48* 1.84*  --   CALCIUM 9.5 9.2  --   PHOS 4.3 4.2  --   HGB 8.1* 7.4* 8.2*   Inpatient medications: . (feeding supplement) PROSource Plus  30 mL Oral BID BM  . amLODipine  5 mg Oral Daily  . brimonidine  1 drop Both Eyes BID  . Chlorhexidine Gluconate Cloth  6 each Topical Daily  . cycloSPORINE  1 drop Both Eyes BID   . dorzolamide  1 drop Both Eyes Daily  . feeding supplement (NEPRO CARB STEADY)  237 mL Oral BID BM  . furosemide  60 mg Intravenous Q12H  . insulin aspart  0-5 Units Subcutaneous QHS  . insulin aspart  0-9 Units Subcutaneous TID WC  . latanoprost  1 drop Both Eyes QHS  . mouth rinse  15 mL Mouth Rinse BID  . metoprolol tartrate  25 mg Oral BID  . mycophenolate  180 mg Oral BID  . pantoprazole  40 mg Oral Daily  . predniSONE  7.5 mg Oral Q breakfast  . tacrolimus  1 mg Oral BID  . valACYclovir  500 mg Oral BID  . cyanocobalamin  1,000 mcg Oral Daily   . sodium chloride Stopped (01/02/21 2107)  . heparin 250 Units/hr (01/05/21 1541)   sodium chloride, acetaminophen, fentaNYL (SUBLIMAZE) injection, hydrALAZINE, lip balm, naLOXone (NARCAN)  injection, [DISCONTINUED] ondansetron **OR** ondansetron (ZOFRAN) IV, oxyCODONE

## 2021-01-05 NOTE — Progress Notes (Signed)
Progress Note   Subjective   Doing well today, the patient denies CP or SOB.  No new concerns  Inpatient Medications    Scheduled Meds: . (feeding supplement) PROSource Plus  30 mL Oral BID BM  . amLODipine  5 mg Oral Daily  . brimonidine  1 drop Both Eyes BID  . Chlorhexidine Gluconate Cloth  6 each Topical Daily  . cycloSPORINE  1 drop Both Eyes BID  . dorzolamide  1 drop Both Eyes Daily  . feeding supplement (NEPRO CARB STEADY)  237 mL Oral BID BM  . free water  200 mL Oral Q4H  . insulin aspart  0-15 Units Subcutaneous Q4H  . latanoprost  1 drop Both Eyes QHS  . mouth rinse  15 mL Mouth Rinse BID  . metoprolol tartrate  25 mg Oral BID  . mycophenolate  180 mg Oral BID  . pantoprazole  40 mg Oral Daily  . predniSONE  7.5 mg Oral Q breakfast  . tacrolimus  1 mg Oral BID  . valACYclovir  500 mg Oral BID  . cyanocobalamin  1,000 mcg Oral Daily   Continuous Infusions: . sodium chloride Stopped (01/02/21 2107)  . heparin 250 Units/hr (01/05/21 0725)   PRN Meds: sodium chloride, acetaminophen, fentaNYL (SUBLIMAZE) injection, hydrALAZINE, lip balm, naLOXone (NARCAN)  injection, [DISCONTINUED] ondansetron **OR** ondansetron (ZOFRAN) IV, oxyCODONE   Vital Signs    Vitals:   01/04/21 2359 01/05/21 0000 01/05/21 0200 01/05/21 0400  BP:  (!) 138/46 (!) 129/48 (!) 124/48  Pulse:  72 71 67  Resp:  15 18 14   Temp: (!) 97.5 F (36.4 C)   (!) 97.5 F (36.4 C)  TempSrc: Oral   Oral  SpO2:  92% 94% 100%  Weight:      Height:        Intake/Output Summary (Last 24 hours) at 01/05/2021 0856 Last data filed at 01/05/2021 0348 Gross per 24 hour  Intake 92.77 ml  Output --  Net 92.77 ml   Filed Weights   12/28/20 0500 12/29/20 0500 01/04/21 0500  Weight: 77 kg 77.2 kg 80.6 kg    Telemetry    sinus - Personally Reviewed  Physical Exam   GEN- The patient is ill appearing, alert and oriented x 3 today.   Head- normocephalic, atraumatic Eyes-  Sclera clear, conjunctiva  pink Ears- hearing intact Neck- supple, Lungs-  normal work of breathing Heart- Regular rate and rhythm  GI- soft  Extremities- no clubbing, cyanosis, +edema     Labs    Chemistry Recent Labs  Lab 01/01/21 0457 01/02/21 0256 01/03/21 0300 01/04/21 0556 01/05/21 0511  NA 138 137  135 146* 132* 134*  K 4.9 4.7  4.7 5.5* 4.8 4.6  CL 104 102  101 110 98 101  CO2 25 24  25  18* 24 24  GLUCOSE 104* 165*  166* 162* 139* 144*  BUN 69* 64*  62* 62* 67* 77*  CREATININE 1.43* 1.37*  1.32* 1.48* 1.48* 1.84*  CALCIUM 9.4 9.6  9.4 9.5 9.5 9.2  PROT 5.0* 5.4* 5.6*  --   --   ALBUMIN 2.3* 2.3*  2.3* 2.3* 2.2* 2.1*  AST 12* 12* 11*  --   --   ALT 11 11 11   --   --   ALKPHOS 43 48 53  --   --   BILITOT 1.8* 1.6* 1.4*  --   --   GFRNONAA 41* 43*  45* 40* 40* 30*  ANIONGAP 9 11  9 18* 10 9     Hematology Recent Labs  Lab 01/03/21 0720 01/04/21 0556 01/05/21 0511  WBC 18.8* 14.9* 10.7*  RBC 2.72* 2.50* 2.28*  HGB 8.8* 8.1* 7.4*  HCT 29.0* 25.6* 23.7*  MCV 106.6* 102.4* 103.9*  MCH 32.4 32.4 32.5  MCHC 30.3 31.6 31.2  RDW 19.4* 18.6* 18.6*  PLT 96* 108* 93*     Patient ID  [id number] y.o.femalewith a hx of HTN, GERD, COPD, MGUS, HFpEF,SVT 01/2020,ESRD with renal transplant 2013 on immunosuppressants followed at [facility],admitted 1.24.22 with diverticulitis after failing outpt therapy. Initially followed by cardiology for hypotension, however now with new onset atrial flutter with RVR.   Assessment & Plan    1.  Typical atrial flutter now back in sinus following [place] on 12/25/20 Ideally, we would not interrupt anticoagulation for 30 days post cardioversion. I have spoken with Dr Algis Liming.  We will continue IV heparin for now unless anemia worsens further or we have active bleeding.  Once more stable, we will convert to eliquis  2. Acute on chronic diastolic dysfunction Stable No change required today  Cardiology to follow  Thompson Grayer MD, [facility] 01/05/2021 8:56 AM

## 2021-01-05 NOTE — Progress Notes (Signed)
ANTICOAGULATION CONSULT NOTE - Follow Up Consult  Pharmacy Consult for Heparin Indication: atrial fibrillation  Allergies  Allergen Reactions  . Infed [Iron Dextran] Other (See Comments)    Chest tightness  . Pentamidine Itching, Shortness Of Breath and Swelling  . Budesonide-Formoterol Fumarate     Other reaction(s): hoarseness  . Bupropion     Other reaction(s): exacerbated depression  . Crestor [Rosuvastatin]     Other reaction(s): body aches, swelling  . Duloxetine Hcl     Other reaction(s): GI SE, weepy, worsening fatigue, foggy  . Erythromycin [Erythromycin] Other (See Comments)    Mouth Ulcers  . Iohexol Other (See Comments)    Per patient "she has had a kidney transplant and should never have contrast"  . Oxycodone Nausea Only and Nausea And Vomiting  . Pravastatin Sodium     Other reaction(s): myalgias  . Erythromycin Rash    Causes breakout in mouth  . Ultram [Tramadol Hcl] Anxiety    Patient Measurements: Height: 5\' 1"  (154.9 cm) Weight: 80.6 kg (177 lb 11.1 oz) IBW/kg (Calculated) : 47.8 Heparin Dosing Weight: 65 kg  Vital Signs: Temp: 97.5 F (36.4 C) (02/13 0400) Temp Source: Oral (02/13 0400) BP: 124/48 (02/13 0400) Pulse Rate: 67 (02/13 0400)  Labs: Recent Labs     0000 01/03/21 0300 01/03/21 0720 01/03/21 1939 01/04/21 0556 01/04/21 1315 01/04/21 2120 01/05/21 0511  HGB   < >  --  8.8*  --  8.1*  --   --  7.4*  HCT  --   --  29.0*  --  25.6*  --   --  23.7*  PLT  --   --  96*  --  108*  --   --  93*  HEPARINUNFRC  --   --   --    < > 0.84* 0.78* 0.72* 0.67  CREATININE  --  1.48*  --   --  1.48*  --   --  1.84*   < > = values in this interval not displayed.    Estimated Creatinine Clearance: 30.1 mL/min (A) (by C-G formula based on SCr of 1.84 mg/dL (H)).   Medications:  Scheduled:  . (feeding supplement) PROSource Plus  30 mL Oral BID BM  . amLODipine  5 mg Oral Daily  . brimonidine  1 drop Both Eyes BID  . Chlorhexidine Gluconate  Cloth  6 each Topical Daily  . cycloSPORINE  1 drop Both Eyes BID  . dorzolamide  1 drop Both Eyes Daily  . feeding supplement (NEPRO CARB STEADY)  237 mL Oral BID BM  . free water  200 mL Oral Q4H  . insulin aspart  0-15 Units Subcutaneous Q4H  . latanoprost  1 drop Both Eyes QHS  . mouth rinse  15 mL Mouth Rinse BID  . metoprolol tartrate  25 mg Oral BID  . mycophenolate  180 mg Oral BID  . pantoprazole  40 mg Oral Daily  . predniSONE  7.5 mg Oral Q breakfast  . tacrolimus  1 mg Oral BID  . valACYclovir  500 mg Oral BID  . cyanocobalamin  1,000 mcg Oral Daily   Infusions:  . sodium chloride Stopped (01/02/21 2107)  . heparin 350 Units/hr (01/05/21 0348)    Assessment: 65 yo female on apixaban PTA and last inpatient dose of apixaban was on 2/5 presented with perforated acute sigmoid diverticulitis s/p Hartman's on 2/6 has had oozing from wound site and anticoagulation has not been restarted as a result until  today 2/11 when CCS stated could be restarted if Hgb remained stable (8.9 > 8.8). Md ordered to start IV heparin per Rx, will not give bolus due to oozing and s/p multiple blood transfusions the last few days  Today, 01/05/2021:  HL 0.67 on 350 units/hr  Hgb down to 7.4, plts down 93   no bleeding reported  Goal of Therapy:  Heparin level 0.3-0.5 units/ml Monitor platelets by anticoagulation protocol: Yes   Plan:   Decrease heparin IV infusion to 250 units/hr  Heparin level 6 hours after rate change  Daily heparin level and CBC  Continue to monitor H&H and platelets   Dolly Rias RPh 01/05/2021, 6:41 AM

## 2021-01-05 NOTE — Progress Notes (Signed)
Assessment & Plan: POD#6 - s/p Hartman's resection for perforated sigmoid diverticulitis - Dr. Ninfa Linden - 12/29/20             Renal diet             Wound care - wet to dry dressing changes - will need local debridement             Mobilize, PT to evaluate             Pulmonary toilet - encourage IS use             WBC improving - 10.7 this AM  Polycystic disease with renal transplant 2013             Prograf & prednisone. Renalfollowing.  Creatinine 9.37 this AM CHF/uncertain etiology             Cardiology following Chronic COPD/history of tobacco use Non-occlusive deep vein thrombosis involving the right popliteal vein, age indeterminate Hx SVT Atrial flutter with RVR             On heparin drip, pending restart of Eliquis  Hgb drop again, now 7.4 - ?source of blood loss - none obvious  Consider holding heparin today and repeat Hgb in AM 2/14 - ?CT abdomen        April Gemma, MD       [facility]       Office: [phone]   Chief Complaint: Perforated sigmoid diverticulitis  Subjective: Patient in bed, comfortable.  Responds to voice.  Mild abdominal pain.  Limited diet.  Objective: Vital signs in last 24 hours: Temp:  [97.4 F (36.3 C)-98.1 F (36.7 C)] 97.5 F (36.4 C) (02/13 0400) Pulse Rate:  [67-78] 67 (02/13 0400) Resp:  [13-22] 14 (02/13 0400) BP: (104-138)/(46-106) 124/48 (02/13 0400) SpO2:  [87 %-100 %] 100 % (02/13 0400) FiO2 (%):  [28 %] 28 % (02/12 2300) Last BM Date: 01/02/21  Intake/Output from previous day: 02/12 0701 - 02/13 0700 In: 92.8 [I.V.:92.8] Out: -  Intake/Output this shift: No intake/output data recorded.  Physical Exam: HEENT - sclerae clear, mucous membranes moist Neck - soft Abdomen - soft, stoma viable, little in bag; midline dressing dry and intact Neuro - alert & oriented, no focal deficits  Lab Results:  Recent Labs    01/04/21 0556 01/05/21 0511  WBC 14.9* 10.7*  HGB 8.1* 7.4*  HCT  25.6* 23.7*  PLT 108* 93*   BMET Recent Labs    01/04/21 0556 01/05/21 0511  NA 132* 134*  K 4.8 4.6  CL 98 101  CO2 24 24  GLUCOSE 139* 144*  BUN 67* 77*  CREATININE 1.48* 1.84*  CALCIUM 9.5 9.2   PT/INR No results for input(s): LABPROT, INR in the last 72 hours. Comprehensive Metabolic Panel:    Component Value Date/Time   NA 134 (L) 01/05/2021 0511   NA 132 (L) 01/04/2021 0556   K 4.6 01/05/2021 0511   K 4.8 01/04/2021 0556   CL 101 01/05/2021 0511   CL 98 01/04/2021 0556   CO2 24 01/05/2021 0511   CO2 24 01/04/2021 0556   BUN 77 (H) 01/05/2021 0511   BUN 67 (H) 01/04/2021 0556   CREATININE 1.84 (H) 01/05/2021 0511   CREATININE 1.48 (H) 01/04/2021 0556   CREATININE 1.28 (H) 11/04/2020 0952   CREATININE 1.30 (H) 08/05/2020 1523   GLUCOSE 144 (H) 01/05/2021 0511   GLUCOSE 139 (H) 01/04/2021 2992  CALCIUM 9.2 01/05/2021 0511   CALCIUM 9.5 01/04/2021 0556   CALCIUM 10.8 (H) 10/13/2011 1405   AST 11 (L) 01/03/2021 0300   AST 12 (L) 01/02/2021 0256   AST 13 (L) 11/04/2020 0952   AST 10 (L) 08/05/2020 1523   ALT 11 01/03/2021 0300   ALT 11 01/02/2021 0256   ALT 12 11/04/2020 0952   ALT <6 08/05/2020 1523   ALKPHOS 53 01/03/2021 0300   ALKPHOS 48 01/02/2021 0256   BILITOT 1.4 (H) 01/03/2021 0300   BILITOT 1.6 (H) 01/02/2021 0256   BILITOT 0.6 11/04/2020 0952   BILITOT 0.6 08/05/2020 1523   PROT 5.6 (L) 01/03/2021 0300   PROT 5.4 (L) 01/02/2021 0256   ALBUMIN 2.1 (L) 01/05/2021 0511   ALBUMIN 2.2 (L) 01/04/2021 0556    Studies/Results: VAS [place] ABI WITH/WO TBI  Result Date: 01/04/2021 LOWER EXTREMITY DOPPLER STUDY Indications: Dusky Toes. High Risk Factors: Hypertension, hyperlipidemia.  Limitations: Today's exam was limited due to Left restricted arm. Comparison Study: No prior studies. Performing Technologist: Carlos Levering RVT  Examination Guidelines: A complete evaluation includes at minimum, Doppler waveform signals and systolic blood pressure reading at  the level of bilateral brachial, anterior tibial, and posterior tibial arteries, when vessel segments are accessible. Bilateral testing is considered an integral part of a complete examination. Photoelectric Plethysmograph (PPG) waveforms and toe systolic pressure readings are included as required and additional duplex testing as needed. Limited examinations for reoccurring indications may be performed as noted.  ABI Findings: +---------+------------------+-----+---------+--------+ Right    Rt Pressure (mmHg)IndexWaveform Comment  +---------+------------------+-----+---------+--------+ Brachial 141                    triphasic         +---------+------------------+-----+---------+--------+ PTA      203               1.44 triphasic         +---------+------------------+-----+---------+--------+ DP       153               1.09 triphasic         +---------+------------------+-----+---------+--------+ Great Toe0                 0.00                   +---------+------------------+-----+---------+--------+ +---------+------------------+-----+---------+--------------+ Left     Lt Pressure (mmHg)IndexWaveform Comment        +---------+------------------+-----+---------+--------------+ Brachial                                 Restricted arm +---------+------------------+-----+---------+--------------+ PTA      194               1.38 triphasic               +---------+------------------+-----+---------+--------------+ DP       147               1.04 triphasic               +---------+------------------+-----+---------+--------------+ Great Toe0                 0.00                         +---------+------------------+-----+---------+--------------+ +-------+-----------+-----------+------------+------------+ ABI/TBIToday's ABIToday's TBIPrevious ABIPrevious TBI +-------+-----------+-----------+------------+------------+ Right  1.44       0                                    +-------+-----------+-----------+------------+------------+  Left   1.38       0                                   +-------+-----------+-----------+------------+------------+  Summary: Right: Resting right ankle-brachial index indicates noncompressible right lower extremity arteries. The right toe-brachial index is abnormal. Left: Resting left ankle-brachial index indicates noncompressible left lower extremity arteries. The left toe-brachial index is abnormal.  *See table(s) above for measurements and observations.  Electronically signed by Harold Barban MD on 01/04/2021 at 8:00:58 PM.    Final       April [name] 01/05/2021  Patient ID: April [name], female   DOB: 30-Jan-1956, 65 y.o.   MRN: [id number]

## 2021-01-05 NOTE — Progress Notes (Addendum)
Pharmacy - IV heparin  Assessment:    Please see note from Rema Fendt) Glennon Mac, PharmD earlier today for full details.  Briefly, 64 y.o. female on IV heparin for Afib/flutter and chronic nonocclusive DVT. Patient has had oozing from surgical site and required multiple units of blood in the past week. Currently on very low rate of heparin with conservative goal 0.3 - 0.5 units/mL   Most recent heparin level just slightly above goal at 0.55 on 250 units/hr  No active bleeding per RN  CBC repeated this afternoon shows improved Hgb/Plt  Plan:   Continue heparin at 250 units/hr - question the clinical efficacy of dosing below the current rate (daily dose 6000 units, compared to 1500 units daily with prophylactic SubQ heparin); would continue as-is as long as no new bleeding. If bleeding noted, would consider an alternative anticoagulation strategy.  Daily CBC and heparin level  Reuel Boom, PharmD, [place] 01/05/2021, 2:03 PM

## 2021-01-05 NOTE — Progress Notes (Signed)
PROGRESS NOTE   April [name]  QZE:[id number]    DOB: 05-16-1956    DOA: 12/16/2020  PCP: [name], MD   I have briefly reviewed patients previous medical records in [facility]  Chief Complaint  Patient presents with  . Abdominal Pain    Brief Narrative:  PCCM to [facility] transfer 01/02/2021: 65 year old female with medical history of severe persistent asthma, chronic respiratory failure with hypoxia on home oxygen 2 L/min as needed, ESRD s/p deceased donor renal transplant in 2013 on immunosuppression and followed at [facility], chronic diastolic CHF, MGUS, HTN, recently seen in the ER for uncomplicated sigmoid diverticulitis, returned to the hospital with abdominal pain and fever.  She was admitted to the hospital and started on antibiotics, developed atrial fibrillation with RVR.  She underwent [facility].  On the morning of 12/29/2020, patient developed acute abdominal pain and CT imaging concerning for free air within the abdomen from perforated diverticulitis. S/p Hartman's procedure, postop remained on ventilator and vasopressors.  Stabilized and care transferred to [facility].   Assessment & Plan:  Active Problems:   GERD without esophagitis   Renal transplant recipient   Depression   Sepsis ([place])   SOB (shortness of breath)   Acute diverticulitis   Sigmoid diverticulitis   Acute CHF (congestive heart failure) (HCC)   Bilious vomiting with nausea   Difficult intravenous access   Acute respiratory failure with hypoxia secondary to septic shock due to perforated diverticulitis and peritonitis complicating underlying COPD, chronic respiratory failure with hypoxia: She was managed by critical care in the ICU.  Intubated, extubated 2/8.  Shock resolved and weaned off vasopressors.  Mobilize with PT.  Incentive spirometry.  Patient states that she only uses oxygen as needed 2 L/min.  Saturation goal: 88-92%.  Completed Zosyn 2/11.  Remains on valacyclovir  (renal transplant).  Back on 2 L/min [place] oxygen.  Does not appear to have been out of bed despite discussing with nursing yesterday regarding mobilization.  Discussed again with RN today.  Perforated acute sigmoid diverticulitis s/p Hartman's on 2/6 General surgery follow-up appreciated.  Completed IV Zosyn 2/11.  Diet advanced to regular consistency diet today 2/13  Suspected OSA OSA suspected based on apneic episodes at night.  Per PCCM, continue CPAP for now at night while hospitalized but eventually will need sleep study as an outpatient.  Outpatient follow-up with pulmonology/Dr. Vaughan Browner is her primary pulmonologist.  Type II DM with hyperglycemia Complicated by steroids.  Off Lantus due to hypoglycemic episodes.  Reasonable control on SSI alone, monitor.  Atrial flutter (typical) with RVR, s/p TEE and DCCV on 12/25/2020/prior SVT  Currently in sinus rhythm.  Apixaban was discontinued on 2/6 for her abdominal surgery.  Anticoagulation had not been resumed due to slow oozing from the wound, needed blood transfusion on 2/7 and 2/9.  As per discussion with general surgery, resumed IV heparin 2/11 and if no further bleeding/oozing, stable hemoglobin then consider switching to Eliquis.  Continue metoprolol 25 mg twice daily.  EP cardiology follow-up appreciated.  CHA2DS2-VASc score: At least 3.  Following cardioversion, she will require at least 30 days of uninterrupted [place] therapy.  Could consider eventual elective ablation to hopefully reduce need for long-term Eros therapy (they are not aware of her having a history of A. fib).  EP cardiology indicates that since she is followed by [facility] closely post transplant, she may benefit from seeing [facility] for follow-up.  Due to recent DCCV, recommend uninterrupted anticoagulation  unless worsening anemia or active bleed (no overt bleeding reported at this time).  Repeat hemoglobin up from 7.4 (may have been a lab error) this morning to 8.2.  Continue IV  heparin.  Discussed with Dr. Rayann Heman.   Acute on chronic diastolic CHF Mild third spacing but no overt heart failure.    Acute kidney injury complicating CKD stage III a: Baseline creatinine 1.1-1.5.  AKI due to volume depletion from nausea and vomiting, hypotension, sepsis, third spacing and acute on chronic diastolic CHF.  Serum creatinine peaked to 2.7.  Creatinine has gone up from 1.32-1.48 on 2/11 along with hypernatremia and hyperkalemia.  Nephrology signed off 2/9.  Creatinine had reduced to 1.4 range with IV fluids but back up again today to 1.84.  Consulted nephrology for assistance.  Hypernatremia Resolved.  Hyperkalemia Give a dose of Lokelma, change diet to low potassium diet.  Resolved  Adrenal insufficiency: Low a.m. cortisol, renin/aldosterone in process.  Blood pressures normal.  Transitioned from IV Solu-Medrol to prior home dose of prednisone 7.5 mg daily on 2/13.  S/p renal transplant Per nephrology recommendations, continue Prograf/Myfortic and prior home dose of prednisone.  Tacrolimus level 2/4: 6 (normal).    Postop acute blood loss anemia: S/p transfusions.  Hemoglobin up to 8 g range.  Follow CBC in a.m. and transfuse if hemoglobin 7 g or less.  Hemoglobin has dropped slightly from 8.8-8.1.  Hemoglobin this morning was 7.4 which may have been a lab error.  Repeated and up to 8.2.  Follow CBC daily.  Thrombocytopenia: Platelet count up to 120.  Follow CBC  Difficult peripheral IV access Per IV team, no options for peripheral IV.  PCCM consulted for central line.  No peripheral PICC or midline due to history of renal transplant and chronic kidney disease.  It appears that they were able to finally find a peripheral IV line and no central line was needed for now.  Eventually may need a central line.  PCCM aware.  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  Bilateral toe brachial indices abnormal.  Etiology  unclear.  Discussed with RN to try and keep fingers and toes socks and gloves.  Trial of low-dose amlodipine 5 mg daily.  Toes and fingers actually appeared much better compared to 2 days ago  Body mass index is 33.57 kg/m.  Nutritional Status Nutrition Problem: Inadequate oral intake Etiology: inability to eat Signs/Symptoms: NPO status Interventions: Refer to RD note for recommendations    DVT prophylaxis: Place and maintain sequential compression device Start: 12/31/20 1004     Code Status: Full Code Family Communication: I discussed in detail with patient's daughter via phone on 2/11, updated care and answered questions.  None at bedside today Disposition:  Status is: Inpatient  Remains inpatient appropriate because:Inpatient level of care appropriate due to severity of illness   Dispo:  Patient From: Home  Planned Disposition: Home with Health Care Svc  Expected discharge date: 01/03/2021  Medically stable for discharge: No          Consultants:   General surgery Cardiology Nephrology  Procedures:   12/29/2020: Exploratory laparotomy, sigmoid colectomy and end colostomy, central venous line, arterial line. 12/31/2020: Extubated. Foley catheter-to be discontinued 2/10  Significant Hospital events: 1/24 Admit 2/2 TEE guided cardioversion 2/6 Hartman's procedure for perf diverticulitis, remains on vent post op 2/8 Extubated, PRBC for low Hb   Antimicrobials:    Anti-infectives (From admission, onward)   Start  Dose/Rate Route Frequency Ordered Stop   12/31/20 2200  valACYclovir (VALTREX) tablet 500 mg        500 mg Oral 2 times daily 12/31/20 2100     12/31/20 1000  valACYclovir (VALTREX) compounded oral suspension 500 mg  Status:  Discontinued        500 mg Per Tube 2 times daily 12/30/20 2051 12/31/20 2100   12/29/20 1045  piperacillin-tazobactam (ZOSYN) IVPB 3.375 g        3.375 g 12.5 mL/hr over 240 Minutes Intravenous Every 8 hours 12/29/20 0956  01/04/21 0152   12/24/20 0800  ciprofloxacin (CIPRO) tablet 500 mg  Status:  Discontinued        500 mg Oral Daily with breakfast 12/23/20 1721 12/23/20 1737   12/23/20 2200  metroNIDAZOLE (FLAGYL) tablet 500 mg  Status:  Discontinued        500 mg Oral Every 8 hours 12/23/20 1721 12/23/20 1737   12/23/20 2200  amoxicillin-clavulanate (AUGMENTIN) 500-125 MG per tablet 500 mg  Status:  Discontinued        1 tablet Oral 2 times daily 12/23/20 1739 12/29/20 0949   12/18/20 1100  Ampicillin-Sulbactam (UNASYN) 3 g in sodium chloride 0.9 % 100 mL IVPB  Status:  Discontinued        3 g 200 mL/hr over 30 Minutes Intravenous Every 12 hours 12/18/20 0729 12/23/20 1721   12/17/20 1400  Ampicillin-Sulbactam (UNASYN) 3 g in sodium chloride 0.9 % 100 mL IVPB  Status:  Discontinued        3 g 200 mL/hr over 30 Minutes Intravenous Every 6 hours 12/17/20 1004 12/18/20 0729   12/16/20 2200  ciprofloxacin (CIPRO) IVPB 400 mg  Status:  Discontinued        400 mg 200 mL/hr over 60 Minutes Intravenous Every 12 hours 12/16/20 1451 12/16/20 1734   12/16/20 2200  piperacillin-tazobactam (ZOSYN) IVPB 3.375 g  Status:  Discontinued        3.375 g 12.5 mL/hr over 240 Minutes Intravenous Every 8 hours 12/16/20 1734 12/17/20 0949   12/16/20 1800  metroNIDAZOLE (FLAGYL) IVPB 500 mg  Status:  Discontinued        500 mg 100 mL/hr over 60 Minutes Intravenous Every 8 hours 12/16/20 1451 12/16/20 1734   12/16/20 1515  valACYclovir (VALTREX) tablet 500 mg  Status:  Discontinued        500 mg Oral 2 times daily 12/16/20 1505 12/30/20 2048   12/16/20 0915  ciprofloxacin (CIPRO) IVPB 400 mg        400 mg 200 mL/hr over 60 Minutes Intravenous  Once 12/16/20 0912 12/16/20 1104   12/16/20 0915  metroNIDAZOLE (FLAGYL) IVPB 500 mg        500 mg 100 mL/hr over 60 Minutes Intravenous  Once 12/16/20 0912 12/16/20 1104        Subjective:  States that she was not out of bed to chair yesterday.  Transient mild epistaxis, resolved.   Tolerating liquid diet but unsure how much she is consuming.  Denies any other source of bleeding or black stools.  Objective:   Vitals:   01/05/21 0200 01/05/21 0400 01/05/21 1022 01/05/21 1140  BP: (!) 129/48 (!) 124/48 (!) 148/102   Pulse: 71 67    Resp: 18 14    Temp:  (!) 97.5 F (36.4 C)  98.1 F (36.7 C)  TempSrc:  Oral  Axillary  SpO2: 94% 100%    Weight:      Height:  General exam: Middle-age female, moderately built and obese sitting up comfortably in bed.  Does not appear in any distress. Respiratory system: Clear to auscultation.  No increased work of breathing. Cardiovascular system: S1 and S2 heard, RRR.  No JVD.  Trace bilateral ankle edema.  Telemetry personally reviewed: Sinus rhythm. Gastrointestinal system: Abdomen is nondistended, soft and nontender. No organomegaly or masses felt. Normal bowel sounds heard.  Dressing over surgical site clean and dry.  Left Hartman's pouch with minimal liquids. Central nervous system: Alert and oriented. No focal neurological deficits. Extremities: Symmetric 5 x 5 power.  Bilateral toes and fingers much improved compared to 2 days ago, faintly dusky.  Palpable distal pulses. Skin: Fingers and toes as noted above. Psychiatry: Judgement and insight appear normal. Mood & affect appropriate.     Data Reviewed:   I have personally reviewed following labs and imaging studies   CBC: Recent Labs  Lab 12/31/20 0530 01/01/21 0457 01/01/21 1204 01/02/21 0256 01/03/21 0720 01/04/21 0556 01/05/21 0511 01/05/21 1202  WBC 11.6* 15.3*  --  19.3*   < > 14.9* 10.7* 12.8*  NEUTROABS 10.6* 14.5*  --  17.8*  --   --   --   --   HGB 7.6* 6.7*   < > 8.9*   < > 8.1* 7.4* 8.2*  HCT 24.0* 22.1*   < > 27.9*   < > 25.6* 23.7* 27.6*  MCV 101.3* 106.8*  --  103.3*   < > 102.4* 103.9* 108.7*  PLT 104* 109*  --  121*   < > 108* 93* 120*   < > = values in this interval not displayed.    Basic Metabolic Panel: Recent Labs  Lab  12/31/20 0530 12/31/20 1259 01/01/21 0457 01/02/21 0256 01/03/21 0300 01/04/21 0556 01/05/21 0511  NA 139  --    < > 137  135 146* 132* 134*  K 4.2  --    < > 4.7  4.7 5.5* 4.8 4.6  CL 104  --    < > 102  101 110 98 101  CO2 25  --    < > 24  25 18* 24 24  GLUCOSE 96  --    < > 165*  166* 162* 139* 144*  BUN 76*  --    < > 64*  62* 62* 67* 77*  CREATININE 1.41*  --    < > 1.37*  1.32* 1.48* 1.48* 1.84*  CALCIUM 9.6  --    < > 9.6  9.4 9.5 9.5 9.2  MG 2.2 2.0  --  2.1  --   --   --   PHOS 3.5  --    < > 4.3  4.4  --  4.3 4.2   < > = values in this interval not displayed.    Liver Function Tests: Recent Labs  Lab 01/01/21 0457 01/02/21 0256 01/03/21 0300 01/04/21 0556 01/05/21 0511  AST 12* 12* 11*  --   --   ALT 11 11 11   --   --   ALKPHOS 43 48 53  --   --   BILITOT 1.8* 1.6* 1.4*  --   --   PROT 5.0* 5.4* 5.6*  --   --   ALBUMIN 2.3* 2.3*  2.3* 2.3* 2.2* 2.1*    CBG: Recent Labs  Lab 01/04/21 2310 01/05/21 0338 01/05/21 0806  GLUCAP 210* 154* 109*    Microbiology Studies:   No results found for this or any previous  visit (from the past 240 hour(s)).   Radiology Studies:  No results found.   Scheduled Meds:   . (feeding supplement) PROSource Plus  30 mL Oral BID BM  . amLODipine  5 mg Oral Daily  . brimonidine  1 drop Both Eyes BID  . Chlorhexidine Gluconate Cloth  6 each Topical Daily  . cycloSPORINE  1 drop Both Eyes BID  . dorzolamide  1 drop Both Eyes Daily  . feeding supplement (NEPRO CARB STEADY)  237 mL Oral BID BM  . free water  200 mL Oral Q4H  . insulin aspart  0-15 Units Subcutaneous Q4H  . latanoprost  1 drop Both Eyes QHS  . mouth rinse  15 mL Mouth Rinse BID  . metoprolol tartrate  25 mg Oral BID  . mycophenolate  180 mg Oral BID  . pantoprazole  40 mg Oral Daily  . predniSONE  7.5 mg Oral Q breakfast  . tacrolimus  1 mg Oral BID  . valACYclovir  500 mg Oral BID  . cyanocobalamin  1,000 mcg Oral Daily    Continuous  Infusions:   . sodium chloride Stopped (01/02/21 2107)  . heparin 250 Units/hr (01/05/21 0725)     LOS: 20 days     Vernell Leep, MD, [place], [facility]. [facility]    To contact the attending provider between 7A-7P or the covering provider during after hours 7P-7A, please log into the web site [email or web address] and access using universal [place] password for that web site. If you do not have the password, please call the hospital operator.  01/05/2021, 12:10 PM

## 2021-01-06 DIAGNOSIS — N179 Acute kidney failure, unspecified: Secondary | ICD-10-CM | POA: Diagnosis not present

## 2021-01-06 DIAGNOSIS — I5031 Acute diastolic (congestive) heart failure: Secondary | ICD-10-CM | POA: Diagnosis not present

## 2021-01-06 DIAGNOSIS — I483 Typical atrial flutter: Secondary | ICD-10-CM | POA: Diagnosis not present

## 2021-01-06 LAB — IRON AND TIBC
Iron: 21 ug/dL — ABNORMAL LOW (ref 28–170)
Saturation Ratios: 12 % (ref 10.4–31.8)
TIBC: 176 ug/dL — ABNORMAL LOW (ref 250–450)
UIBC: 155 ug/dL

## 2021-01-06 LAB — CBC
HCT: 27.7 % — ABNORMAL LOW (ref 36.0–46.0)
Hemoglobin: 8.6 g/dL — ABNORMAL LOW (ref 12.0–15.0)
MCH: 32.3 pg (ref 26.0–34.0)
MCHC: 31 g/dL (ref 30.0–36.0)
MCV: 104.1 fL — ABNORMAL HIGH (ref 80.0–100.0)
Platelets: 117 10*3/uL — ABNORMAL LOW (ref 150–400)
RBC: 2.66 MIL/uL — ABNORMAL LOW (ref 3.87–5.11)
RDW: 19.5 % — ABNORMAL HIGH (ref 11.5–15.5)
WBC: 12.7 10*3/uL — ABNORMAL HIGH (ref 4.0–10.5)
nRBC: 0 % (ref 0.0–0.2)

## 2021-01-06 LAB — RENAL FUNCTION PANEL
Albumin: 2.2 g/dL — ABNORMAL LOW (ref 3.5–5.0)
Anion gap: 11 (ref 5–15)
BUN: 76 mg/dL — ABNORMAL HIGH (ref 8–23)
CO2: 24 mmol/L (ref 22–32)
Calcium: 9.2 mg/dL (ref 8.9–10.3)
Chloride: 100 mmol/L (ref 98–111)
Creatinine, Ser: 1.78 mg/dL — ABNORMAL HIGH (ref 0.44–1.00)
GFR, Estimated: 32 mL/min — ABNORMAL LOW (ref >60)
Glucose, Bld: 197 mg/dL — ABNORMAL HIGH (ref 70–99)
Phosphorus: 3.7 mg/dL (ref 2.5–4.6)
Potassium: 4.9 mmol/L (ref 3.5–5.1)
Sodium: 135 mmol/L (ref 135–145)

## 2021-01-06 LAB — GLUCOSE, CAPILLARY
Glucose-Capillary: 153 mg/dL — ABNORMAL HIGH (ref 70–99)
Glucose-Capillary: 194 mg/dL — ABNORMAL HIGH (ref 70–99)
Glucose-Capillary: 184 mg/dL — ABNORMAL HIGH (ref 70–99)
Glucose-Capillary: 181 mg/dL — ABNORMAL HIGH (ref 70–99)

## 2021-01-06 LAB — HEPARIN LEVEL (UNFRACTIONATED): Heparin Unfractionated: 0.45 IU/mL (ref 0.30–0.70)

## 2021-01-06 LAB — FERRITIN: Ferritin: 410 ng/mL — ABNORMAL HIGH (ref 11–307)

## 2021-01-06 NOTE — Consult Note (Signed)
[place] Nurse ostomy follow up Stoma type/location: LLQ colostomy Stomal assessment/size: 1 1/2" decreased in size.  Peristomal assessment: intact Treatment options for stomal/peristomal skin: creasing with barrier ring Output No stool yet.  Ostomy pouching: 2pc. 2 3/4" pouch Education provided: Pouch change.  Patient minimally participative at this time.  Enrolled patient in [place] Discharge program: Yes today.  May need to modify once she begins making stool.  Will follow.  Domenic Moras MSN, RN, FNP-BC CWON Wound, Ostomy, Continence Nurse Pager [phone]

## 2021-01-06 NOTE — Progress Notes (Signed)
8 Days Post-Op    GY:IRSWNIOEV pain and nausea  Subjective: Making slow progress, not taking much PO, not ambulating very much, getting up to chair is hard for her.  She is not doing her IS.  She has a degree of anasarca with very thin delicate skin and large  Hematoma seroma left arm.  No stool so far just sweat in the ostomy bag and some gas.    Objective: Vital signs in last 24 hours: Temp:  [97.4 F (36.3 C)-98.7 F (37.1 C)] 98.7 F (37.1 C) (02/14 0400) Pulse Rate:  [63-79] 73 (02/14 0600) Resp:  [14-23] 16 (02/14 0600) BP: (116-148)/(55-108) 119/55 (02/14 0600) SpO2:  [98 %-100 %] 100 % (02/14 0600) FiO2 (%):  [28 %] 28 % (02/13 2304) Last BM Date: 01/02/21 66 IV 2050 urine Afebrile vital signs are stable CPAP at night Creatinine 1.78  Intake/Output from previous day: 02/13 0701 - 02/14 0700 In: 66 [I.V.:66] Out: 2050 [Urine:2050] Intake/Output this shift: No intake/output data recorded.  General appearance: alert, cooperative, no distress and tired worn out, generalized edema, almost fall asleep when not doing something with her.   Resp: clear anterior GI: soft, sore, ostomy looks OK, wound below, no stool, just some sweat and gas in the ostomy bag Extremities: generalized swelling, thin delicate skin. large 5 cm hematoma/cyst left arm  I opened the wound more, it had some fluid deeper that was not drained, gently cleaning sides, with 4 x 4's and will increase dressing changes.  Continue wet to dry.  Lab Results:  Recent Labs    01/05/21 0511 01/05/21 1202  WBC 10.7* 12.8*  HGB 7.4* 8.2*  HCT 23.7* 27.6*  PLT 93* 120*    BMET Recent Labs    01/05/21 0511 01/06/21 0400  NA 134* 135  K 4.6 4.9  CL 101 100  CO2 24 24  GLUCOSE 144* 197*  BUN 77* 76*  CREATININE 1.84* 1.78*  CALCIUM 9.2 9.2   PT/INR No results for input(s): LABPROT, INR in the last 72 hours.  Recent Labs  Lab 12/31/20 0530 01/01/21 0457 01/02/21 0256 01/03/21 0300  01/04/21 0556 01/05/21 0511 01/06/21 0400  AST 11* 12* 12* 11*  --   --   --   ALT 11 11 11 11   --   --   --   ALKPHOS 45 43 48 53  --   --   --   BILITOT 2.6* 1.8* 1.6* 1.4*  --   --   --   PROT 4.8* 5.0* 5.4* 5.6*  --   --   --   ALBUMIN 2.4* 2.3* 2.3*  2.3* 2.3* 2.2* 2.1* 2.2*     Lipase     Component Value Date/Time   LIPASE 49 12/29/2020 0822     Medications: . (feeding supplement) PROSource Plus  30 mL Oral BID BM  . amLODipine  5 mg Oral Daily  . brimonidine  1 drop Both Eyes BID  . Chlorhexidine Gluconate Cloth  6 each Topical Daily  . cycloSPORINE  1 drop Both Eyes BID  . dorzolamide  1 drop Both Eyes Daily  . feeding supplement (NEPRO CARB STEADY)  237 mL Oral BID BM  . furosemide  60 mg Intravenous Q12H  . insulin aspart  0-5 Units Subcutaneous QHS  . insulin aspart  0-9 Units Subcutaneous TID WC  . latanoprost  1 drop Both Eyes QHS  . mouth rinse  15 mL Mouth Rinse BID  . metoprolol tartrate  25 mg Oral BID  . mycophenolate  180 mg Oral BID  . pantoprazole  40 mg Oral Daily  . predniSONE  7.5 mg Oral Q breakfast  . tacrolimus  1 mg Oral BID  . valACYclovir  500 mg Oral BID  . cyanocobalamin  1,000 mcg Oral Daily   . sodium chloride Stopped (01/02/21 2107)  . heparin 250 Units/hr (01/06/21 0511)    Assessment/Plan Polycystic disease with renal transplant 2013-Prograf/prednisone. Renalfollowing Elevated Cr CHF/uncertain etiology -Cards following Chronic COPD/history of tobacco use- On 2L PRN at home Pleuritic chest pain and SOB- VQ 1/25:Low probability pulmonary embolus. Age indeterminate, non occlusive deep vein thrombosis involving the right popliteal vein Hx SVT Atrial flutter with RVR- on eliquis, which is currently on hold. Likely reason for elevated INR of 1.8 yesterday and some oozing Hyponatremia - Per medicine - Large hematoma left arm - drained and dressed  Perforated sigmoid diverticulitis Exploratory laparotomy with sigmoid  colectomy/end colostomy, 12/29/2020 DR. Coralie Keens POD #[id number]  FEN: Renal carb modified; taking mostly liquids >> full liquids for now ID: Zosyn 2/6-2/12 DVT: Heparin Follow-up: Dr. Ninfa Linden   Plan:  I placed her back on full liquids, and would keep her there until she actually has some stool coming thru.  I also opened the hematoma and drained a large portion of it.  Will clean with soap and water, xeroform dressings BID.  Work on mobilizing her more.  She has OT/PT orders.    LOS: 21 days    Arno Cullers 01/06/2021 Please see Amion

## 2021-01-06 NOTE — TOC Progression Note (Signed)
Transition of Care [facility]) - Progression Note    Patient Details  Name: April [name] MRN: [id number] Date of Birth: 28-May-1956  Transition of Care [facility]) CM/SW Contact  Leeroy Cha, RN Phone Number: 01/06/2021, 8:07 AM  Clinical Narrative:    Acute respiratory failure with hypoxia secondary to septic shock due to perforated diverticulitis and peritonitis complicating underlying COPD, chronic respiratory failure with hypoxia: She was managed by critical care in the ICU.  Intubated, extubated 2/8.  Shock resolved and weaned off vasopressors.  Mobilize with PT.  Incentive spirometry.  Patient states that she only uses oxygen as needed 2 L/min.  Saturation goal: 88-92%.  Completed Zosyn 2/11.  Remains on valacyclovir (renal transplant).  Back on 2 L/min [place] oxygen.  Does not appear to have been out of bed despite discussing with nursing yesterday regarding mobilization. PLAN: TO RETURN TO HOME WHEN STABLE.  Expected Discharge Plan: Home/Self Care Barriers to Discharge: Continued Medical Work up  Expected Discharge Plan and Services Expected Discharge Plan: Home/Self Care   Discharge Planning Services: CM Consult   Living arrangements for the past 2 months: Single Family Home                                       Social Determinants of Health (SDOH) Interventions    Readmission Risk Interventions Readmission Risk Prevention Plan 05/15/2020 04/19/2020 02/09/2020  Transportation Screening Complete Complete Complete  PCP or Specialist Appt within 3-5 Days - - -  Not Complete comments - - -  HRI or [place] or Home Care Consult comments - - -  Social Work Consult for [place] Planning/Counseling - - -  SW consult not completed comments - - -  Palliative Care Screening - - -  Medication Review ([place]) Referral to Pharmacy Complete Referral to Pharmacy  PCP or Specialist appointment within 3-5 days of discharge Complete Complete Complete   HRI or Home Care Consult Complete Complete Complete  SW Recovery Care/Counseling Consult Complete Complete -  Palliative Care Screening Not Applicable Not Applicable Complete  Skilled Nursing Facility Complete Complete Patient Refused  Some recent data might be hidden

## 2021-01-06 NOTE — Progress Notes (Signed)
PROGRESS NOTE   April [name]  KGY:[id number]    DOB: 1956/11/09    DOA: 12/16/2020  PCP: [name], MD   I have briefly reviewed patients previous medical records in [facility].  Chief Complaint  Patient presents with  . Abdominal Pain    Brief Narrative:  PCCM to [facility] transfer 01/02/2021: 65 year old female with medical history of severe persistent asthma, chronic respiratory failure with hypoxia on home oxygen 2 L/min as needed, ESRD s/p deceased donor renal transplant in 2013 on immunosuppression and followed at [facility], chronic diastolic CHF, MGUS, HTN, recently seen in the ER for uncomplicated sigmoid diverticulitis, returned to the hospital with abdominal pain and fever.  She was admitted to the hospital and started on antibiotics, developed atrial fibrillation with RVR.  She underwent successful TEE with [facility] cardioversion.  On the morning of 12/29/2020, patient developed acute abdominal pain and CT imaging concerning for free air within the abdomen from perforated diverticulitis. S/p Hartman's procedure, postop remained on ventilator and vasopressors.  Stabilized and care transferred to [facility].  Postop course complicated by slow to return bowel functions, recurrent acute on chronic kidney disease with volume overload, IV antibiotics discontinued, attempting to mobilize.   Assessment & Plan:  Active Problems:   GERD without esophagitis   Renal transplant recipient   Depression   Sepsis ([place])   SOB (shortness of breath)   Acute diverticulitis   Sigmoid diverticulitis   Acute CHF (congestive heart failure) (HCC)   Bilious vomiting with nausea   Difficult intravenous access   Acute respiratory failure with hypoxia secondary to septic shock due to perforated diverticulitis and peritonitis complicating underlying COPD, chronic respiratory failure with hypoxia: She was managed by critical care in the ICU.  Intubated, extubated 2/8.  Shock resolved and weaned off vasopressors.   Mobilize with PT.  Incentive spirometry.  Patient states that she only uses oxygen as needed 2 L/min.  Saturation goal: 88-92%.  Completed Zosyn 2/11.  Remains on valacyclovir (renal transplant).  Back on 2 L/min [place] oxygen.  Discussed again with patient and nursing at bedside.  Reportedly sat up at edge of bed yesterday but not to chair due to leg weakness.  Encouraged out of bed to chair with assistance and ongoing mobilization.  PT and OT to see.  Perforated acute sigmoid diverticulitis s/p Hartman's on 2/6 General surgery follow-up appreciated.  Completed IV Zosyn 2/11.  Not much output through Hartman's and hence general surgery have changed diet back to full liquids until she actually has some stool coming through ostomy.  Also s/p I&D of large left arm hematoma by CCS 2/14.  Mobilize.  Suspected OSA OSA suspected based on apneic episodes at night.  Per PCCM, continue CPAP for now at night while hospitalized but eventually will need sleep study as an outpatient.  Outpatient follow-up with pulmonology/Dr. Vaughan Browner is her primary pulmonologist.  Type II DM with hyperglycemia Complicated by steroids.  Off Lantus due to hypoglycemic episodes.  Mildly uncontrolled and fluctuating.  May need to adjust insulins.  Atrial flutter (typical) with RVR, s/p TEE and DCCV on 12/25/2020/prior SVT  Currently in sinus rhythm.  Apixaban was discontinued on 2/6 for her abdominal surgery.  Anticoagulation had not been resumed due to slow oozing from the wound, needed blood transfusion on 2/7 and 2/9.  As per discussion with general surgery, resumed IV heparin 2/11 and if no further bleeding/oozing, stable hemoglobin then consider switching to Eliquis.  Continue metoprolol 25 mg twice daily.  EP cardiology follow-up appreciated.  CHA2DS2-VASc score: At least 3.  Following cardioversion, she will require at least 30 days of uninterrupted [place] therapy.  Could consider eventual elective ablation to hopefully reduce need for  long-term [place] therapy (they are not aware of her having a history of A. fib).  EP cardiology indicates that since she is followed by [facility] closely post transplant, she may benefit from seeing [facility] for follow-up.  Due to recent DCCV, recommend uninterrupted anticoagulation unless worsening anemia or active bleed.  Fortunately no active bleeding, hemoglobin has remained stable.  However we will continue IV heparin for now due to recently I&D left upper arm hematoma and at risk for recurrent bleeding, tenuous GI issues were diet had to be downgraded, there was discussion by CCS about I&D of surgical incision site.   Acute on chronic diastolic CHF Volume overloaded and weight gain.  Nephrology started IV Lasix 60 mg every 12 hourly on 2/13.  2050 mL urine output yesterday.  Continue Lasix per nephrology  Acute kidney injury complicating CKD stage III a: Baseline creatinine 1.1-1.5.  AKI due to volume depletion from nausea and vomiting, hypotension, sepsis, third spacing and acute on chronic diastolic CHF.  Serum creatinine peaked to 2.7.  Creatinine has gone up from 1.32-1.48 on 2/11 along with hypernatremia and hyperkalemia.  Nephrology signed off 2/9.  Creatinine had reduced to 1.4 range with IV fluids but back up again today to 1.84.  Nephrology follow-up 2/13 appreciated, started IV Lasix, creatinine down to 1.7.  Hypernatremia Resolved.  Hyperkalemia Give a dose of Lokelma, change diet to low potassium diet.  Resolved  Adrenal insufficiency: Low a.m. cortisol, renin/aldosterone in process.  Blood pressures normal.  Transitioned from IV Solu-Medrol to prior home dose of prednisone 7.5 mg daily on 2/13.  S/p renal transplant Per nephrology recommendations, continue Prograf/Myfortic and prior home dose of prednisone.  Tacrolimus level 2/4: 6 (normal).    Postop acute blood loss anemia: S/p transfusions.  Hemoglobin has remained stable in the 8 g range from 2/10.  Follow CBC and transfuse if  hemoglobin 7 g or less.  Thrombocytopenia: Platelet count up to 120.  Follow CBC.  Stable  Difficult peripheral IV access Per IV team, no options for peripheral IV.  PCCM consulted for central line.  No peripheral PICC or midline due to history of renal transplant and chronic kidney disease.  It appears that they were able to finally find a peripheral IV line and no central line was needed for now.  Eventually may need a central line.  PCCM aware.  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  Bilateral toe brachial indices abnormal.  Etiology unclear.  Discussed with RN to try and keep fingers and toes socks and gloves.  Trial of low-dose amlodipine 5 mg daily.  Toes and fingers actually appeared much better compared to 2 days ago  Body mass index is 33.57 kg/m.  Nutritional Status Nutrition Problem: Inadequate oral intake Etiology: inability to eat Signs/Symptoms: NPO status Interventions: Refer to RD note for recommendations    DVT prophylaxis: Place and maintain sequential compression device Start: 12/31/20 1004     Code Status: Full Code Family Communication: None at bedside today Disposition:  Status is: Inpatient  Remains inpatient appropriate because:Inpatient level of care appropriate due to severity of illness   Dispo:  Patient From: Home  Planned Disposition: [place]  Expected discharge date: 01/10/2021  Medically stable for  discharge: No          Consultants:   General surgery Cardiology Nephrology PCCM  Procedures:   12/29/2020: Exploratory laparotomy, sigmoid colectomy and end colostomy, central venous line, arterial line. 12/31/2020: Extubated. Foley catheter-to be discontinued 2/10  Significant Hospital events: 1/24 Admit 2/2 TEE guided cardioversion 2/6 Hartman's procedure for perf diverticulitis, remains on vent post op 2/8 Extubated, PRBC for low Hb   Antimicrobials:     Anti-infectives (From admission, onward)   Start     Dose/Rate Route Frequency Ordered Stop   12/31/20 2200  valACYclovir (VALTREX) tablet 500 mg        500 mg Oral 2 times daily 12/31/20 2100     12/31/20 1000  valACYclovir (VALTREX) compounded oral suspension 500 mg  Status:  Discontinued        500 mg Per Tube 2 times daily 12/30/20 2051 12/31/20 2100   12/29/20 1045  piperacillin-tazobactam (ZOSYN) IVPB 3.375 g        3.375 g 12.5 mL/hr over 240 Minutes Intravenous Every 8 hours 12/29/20 0956 01/04/21 0152   12/24/20 0800  ciprofloxacin (CIPRO) tablet 500 mg  Status:  Discontinued        500 mg Oral Daily with breakfast 12/23/20 1721 12/23/20 1737   12/23/20 2200  metroNIDAZOLE (FLAGYL) tablet 500 mg  Status:  Discontinued        500 mg Oral Every 8 hours 12/23/20 1721 12/23/20 1737   12/23/20 2200  amoxicillin-clavulanate (AUGMENTIN) 500-125 MG per tablet 500 mg  Status:  Discontinued        1 tablet Oral 2 times daily 12/23/20 1739 12/29/20 0949   12/18/20 1100  Ampicillin-Sulbactam (UNASYN) 3 g in sodium chloride 0.9 % 100 mL IVPB  Status:  Discontinued        3 g 200 mL/hr over 30 Minutes Intravenous Every 12 hours 12/18/20 0729 12/23/20 1721   12/17/20 1400  Ampicillin-Sulbactam (UNASYN) 3 g in sodium chloride 0.9 % 100 mL IVPB  Status:  Discontinued        3 g 200 mL/hr over 30 Minutes Intravenous Every 6 hours 12/17/20 1004 12/18/20 0729   12/16/20 2200  ciprofloxacin (CIPRO) IVPB 400 mg  Status:  Discontinued        400 mg 200 mL/hr over 60 Minutes Intravenous Every 12 hours 12/16/20 1451 12/16/20 1734   12/16/20 2200  piperacillin-tazobactam (ZOSYN) IVPB 3.375 g  Status:  Discontinued        3.375 g 12.5 mL/hr over 240 Minutes Intravenous Every 8 hours 12/16/20 1734 12/17/20 0949   12/16/20 1800  metroNIDAZOLE (FLAGYL) IVPB 500 mg  Status:  Discontinued        500 mg 100 mL/hr over 60 Minutes Intravenous Every 8 hours 12/16/20 1451 12/16/20 1734   12/16/20 1515   valACYclovir (VALTREX) tablet 500 mg  Status:  Discontinued        500 mg Oral 2 times daily 12/16/20 1505 12/30/20 2048   12/16/20 0915  ciprofloxacin (CIPRO) IVPB 400 mg        400 mg 200 mL/hr over 60 Minutes Intravenous  Once 12/16/20 0912 12/16/20 1104   12/16/20 0915  metroNIDAZOLE (FLAGYL) IVPB 500 mg        500 mg 100 mL/hr over 60 Minutes Intravenous  Once 12/16/20 0912 12/16/20 1104        Subjective:  Seen along with RN at bedside.  At home uses a wheelchair and walker.  Yesterday was up to bedside but  weak and hence did not get up to chair.  Urinated well after Lasix and extremity swelling may be better.  Had abdominal pain yesterday which is somewhat better today.  Not much output through the Hartman's pouch.  Patient prefers to go home but advised her that she is profoundly weak and may need to go to SNF for short-term rehab.  She verbalized understanding  Objective:   Vitals:   01/06/21 0200 01/06/21 0400 01/06/21 0600 01/06/21 0800  BP: 134/70 116/63 (!) 119/55   Pulse: 73 69 73   Resp: 14 20 16    Temp:  98.7 F (37.1 C)  97.9 F (36.6 C)  TempSrc:  Axillary  Axillary  SpO2: 98% 100% 100%   Weight:      Height:        General exam: Middle-age female, moderately built and obese sitting up comfortably in bed.  Does not appear in any distress. Respiratory system: Clear to auscultation.  No increased work of breathing. Cardiovascular system: S1 and S2 heard, RRR.  No JVD.  1+ bilateral leg edema, somewhat better than yesterday.  Telemetry personally reviewed: Sinus rhythm. Gastrointestinal system: Abdomen is nondistended, soft and nontender. No organomegaly or masses felt. Normal bowel sounds heard.  Dressing over surgical site clean and dry.  Left Hartman's pouch little minimally bloodstained liquid. Central nervous system: Alert and oriented. No focal neurological deficits. Extremities: Symmetric 5 x 5 power.  Bilateral toes and fingers much improved compared to 2  days ago, faintly dusky.  Palpable distal pulses. Skin: Fingers and toes as noted above. Psychiatry: Judgement and insight appear normal. Mood & affect appropriate.     Data Reviewed:   I have personally reviewed following labs and imaging studies   CBC: Recent Labs  Lab 12/31/20 0530 01/01/21 0457 01/01/21 1204 01/02/21 0256 01/03/21 0720 01/05/21 0511 01/05/21 1202 01/06/21 1018  WBC 11.6* 15.3*  --  19.3*   < > 10.7* 12.8* 12.7*  NEUTROABS 10.6* 14.5*  --  17.8*  --   --   --   --   HGB 7.6* 6.7*   < > 8.9*   < > 7.4* 8.2* 8.6*  HCT 24.0* 22.1*   < > 27.9*   < > 23.7* 27.6* 27.7*  MCV 101.3* 106.8*  --  103.3*   < > 103.9* 108.7* 104.1*  PLT 104* 109*  --  121*   < > 93* 120* 117*   < > = values in this interval not displayed.    Basic Metabolic Panel: Recent Labs  Lab 12/31/20 0530 12/31/20 1259 01/01/21 0457 01/02/21 0256 01/03/21 0300 01/04/21 0556 01/05/21 0511 01/06/21 0400  NA 139  --    < > 137  135   < > 132* 134* 135  K 4.2  --    < > 4.7  4.7   < > 4.8 4.6 4.9  CL 104  --    < > 102  101   < > 98 101 100  CO2 25  --    < > 24  25   < > 24 24 24   GLUCOSE 96  --    < > 165*  166*   < > 139* 144* 197*  BUN 76*  --    < > 64*  62*   < > 67* 77* 76*  CREATININE 1.41*  --    < > 1.37*  1.32*   < > 1.48* 1.84* 1.78*  CALCIUM 9.6  --    < >  9.6  9.4   < > 9.5 9.2 9.2  MG 2.2 2.0  --  2.1  --   --   --   --   PHOS 3.5  --    < > 4.3  4.4  --  4.3 4.2 3.7   < > = values in this interval not displayed.    Liver Function Tests: Recent Labs  Lab 01/01/21 0457 01/02/21 0256 01/03/21 0300 01/04/21 0556 01/05/21 0511 01/06/21 0400  AST 12* 12* 11*  --   --   --   ALT 11 11 11   --   --   --   ALKPHOS 43 48 53  --   --   --   BILITOT 1.8* 1.6* 1.4*  --   --   --   PROT 5.0* 5.4* 5.6*  --   --   --   ALBUMIN 2.3* 2.3*  2.3* 2.3* 2.2* 2.1* 2.2*    CBG: Recent Labs  Lab 01/05/21 1258 01/05/21 1640 01/05/21 2141  GLUCAP 130* 240* 231*     Microbiology Studies:   No results found for this or any previous visit (from the past 240 hour(s)).   Radiology Studies:  No results found.   Scheduled Meds:   . (feeding supplement) PROSource Plus  30 mL Oral BID BM  . amLODipine  5 mg Oral Daily  . brimonidine  1 drop Both Eyes BID  . Chlorhexidine Gluconate Cloth  6 each Topical Daily  . cycloSPORINE  1 drop Both Eyes BID  . dorzolamide  1 drop Both Eyes Daily  . feeding supplement (NEPRO CARB STEADY)  237 mL Oral BID BM  . furosemide  60 mg Intravenous Q12H  . insulin aspart  0-5 Units Subcutaneous QHS  . insulin aspart  0-9 Units Subcutaneous TID WC  . latanoprost  1 drop Both Eyes QHS  . mouth rinse  15 mL Mouth Rinse BID  . metoprolol tartrate  25 mg Oral BID  . mycophenolate  180 mg Oral BID  . pantoprazole  40 mg Oral Daily  . predniSONE  7.5 mg Oral Q breakfast  . tacrolimus  1 mg Oral BID  . valACYclovir  500 mg Oral BID  . cyanocobalamin  1,000 mcg Oral Daily    Continuous Infusions:   . sodium chloride Stopped (01/02/21 2107)  . heparin 250 Units/hr (01/06/21 0511)     LOS: 21 days     Vernell Leep, MD, [place], [facility]. [facility]    To contact the attending provider between 7A-7P or the covering provider during after hours 7P-7A, please log into the web site [email or web address] and access using universal [place] password for that web site. If you do not have the password, please call the hospital operator.  01/06/2021, 12:02 PM

## 2021-01-06 NOTE — Progress Notes (Signed)
Progress Note  Patient Name: April [name] Date of Encounter: 01/06/2021  [facility] Cardiologist: Sanda Klein, MD   Subjective   Patient states she does not feel well this morning and is having abdominal pain. She also reports some chest pain and states "I think it is musculoskeletal." However, when I asked her to point to where she was having pain, it was really more epigastric pain. She states breathing is improved.  She does have large hematoma/cyst of her left arm. General surgery opened and cleaned wound today.  Inpatient Medications    Scheduled Meds: . (feeding supplement) PROSource Plus  30 mL Oral BID BM  . amLODipine  5 mg Oral Daily  . brimonidine  1 drop Both Eyes BID  . Chlorhexidine Gluconate Cloth  6 each Topical Daily  . cycloSPORINE  1 drop Both Eyes BID  . dorzolamide  1 drop Both Eyes Daily  . feeding supplement (NEPRO CARB STEADY)  237 mL Oral BID BM  . furosemide  60 mg Intravenous Q12H  . insulin aspart  0-5 Units Subcutaneous QHS  . insulin aspart  0-9 Units Subcutaneous TID WC  . latanoprost  1 drop Both Eyes QHS  . mouth rinse  15 mL Mouth Rinse BID  . metoprolol tartrate  25 mg Oral BID  . mycophenolate  180 mg Oral BID  . pantoprazole  40 mg Oral Daily  . predniSONE  7.5 mg Oral Q breakfast  . tacrolimus  1 mg Oral BID  . valACYclovir  500 mg Oral BID  . cyanocobalamin  1,000 mcg Oral Daily   Continuous Infusions: . sodium chloride Stopped (01/02/21 2107)  . heparin 250 Units/hr (01/06/21 0511)   PRN Meds: sodium chloride, acetaminophen, fentaNYL (SUBLIMAZE) injection, hydrALAZINE, lip balm, naLOXone (NARCAN)  injection, [DISCONTINUED] ondansetron **OR** ondansetron (ZOFRAN) IV, oxyCODONE   Vital Signs    Vitals:   01/06/21 0000 01/06/21 0200 01/06/21 0400 01/06/21 0600  BP: (!) 141/81 134/70 116/63 (!) 119/55  Pulse: 76 73 69 73  Resp: (!) 22 14 20 16   Temp: 98 F (36.7 C)  98.7 F (37.1 C)   TempSrc: Axillary  Axillary    SpO2: 100% 98% 100% 100%  Weight:      Height:        Intake/Output Summary (Last 24 hours) at 01/06/2021 0801 Last data filed at 01/06/2021 0645 Gross per 24 hour  Intake 65.95 ml  Output 2050 ml  Net -1984.05 ml   Last 3 Weights 01/04/2021 12/29/2020 12/28/2020  Weight (lbs) 177 lb 11.1 oz 170 lb 3.1 oz 169 lb 12.1 oz  Weight (kg) 80.6 kg 77.2 kg 77 kg      Telemetry    Normal sinus rhythm with rates in the 70's. PAC/PVCs noted.  - Personally Reviewed  ECG    No new ECG tracing today. - Personally Reviewed  Physical Exam   GEN: No acute distress.   Neck: Supple. No JVD. Cardiac: RRR with occasional ectopy. No significant murmurs, rubs, or gallops.  Respiratory: No increased work of breathing. Occasional expiratory wheeze. Diminished breath sounds in bases. Mild crackles in right base. GI: Soft, non-distended, and mildly tender. Ostomy present. MS: 2-3+ pedal edema. SCD currently in placed. Mild edema of upper extremities as well. Large ecchymosis of right hand. Left arm wrapped. No deformity. Neuro:  Nonfocal  Psych: Normal affect   Labs    High Sensitivity Troponin:   Recent Labs  Lab 12/18/20 1042 12/18/20 1357 12/22/20 1643  TROPONINIHS 11 14 5       Chemistry Recent Labs  Lab 01/01/21 0457 01/02/21 0256 01/03/21 0300 01/04/21 0556 01/05/21 0511 01/06/21 0400  NA 138 137  135 146* 132* 134* 135  K 4.9 4.7  4.7 5.5* 4.8 4.6 4.9  CL 104 102  101 110 98 101 100  CO2 25 24  25  18* 24 24 24   GLUCOSE 104* 165*  166* 162* 139* 144* 197*  BUN 69* 64*  62* 62* 67* 77* 76*  CREATININE 1.43* 1.37*  1.32* 1.48* 1.48* 1.84* 1.78*  CALCIUM 9.4 9.6  9.4 9.5 9.5 9.2 9.2  PROT 5.0* 5.4* 5.6*  --   --   --   ALBUMIN 2.3* 2.3*  2.3* 2.3* 2.2* 2.1* 2.2*  AST 12* 12* 11*  --   --   --   ALT 11 11 11   --   --   --   ALKPHOS 43 48 53  --   --   --   BILITOT 1.8* 1.6* 1.4*  --   --   --   GFRNONAA 41* 43*  45* 40* 40* 30* 32*  ANIONGAP 9 11  9  18* 10 9 11       Hematology Recent Labs  Lab 01/04/21 0556 01/05/21 0511 01/05/21 1202  WBC 14.9* 10.7* 12.8*  RBC 2.50* 2.28* 2.54*  HGB 8.1* 7.4* 8.2*  HCT 25.6* 23.7* 27.6*  MCV 102.4* 103.9* 108.7*  MCH 32.4 32.5 32.3  MCHC 31.6 31.2 29.7*  RDW 18.6* 18.6* 18.6*  PLT 108* 93* 120*    BNPNo results for input(s): BNP, PROBNP in the last 168 hours.   DDimer No results for input(s): DDIMER in the last 168 hours.   Radiology    No results found.  Cardiac Studies   TTE 12/17/2020: Impressions: 1. Left ventricular ejection fraction, by estimation, is 70 to 75%. The  left ventricle has hyperdynamic function. The left ventricle has no  regional wall motion abnormalities. Left ventricular diastolic parameters  are consistent with Grade II diastolic  dysfunction (pseudonormalization). Elevated left atrial pressure.  2. Right ventricular systolic function is normal. The right ventricular  size is normal. There is normal pulmonary artery systolic pressure.  3. The mitral valve is normal in structure. No evidence of mitral valve  regurgitation. No evidence of mitral stenosis.  4. The aortic valve is tricuspid. Aortic valve regurgitation is not  visualized. No aortic stenosis is present.  5. The inferior vena cava is normal in size with greater than 50%  respiratory variability, suggesting right atrial pressure of 3 mmHg.  _______________  TEE 12/25/2020: Impressions: 1. Atrial flutter. No LA/LAA thrombus. Successful DCCV x 1 with 120 J and  return to NSR.  2. Left ventricular ejection fraction, by estimation, is 55 to 60%. The  left ventricle has normal function.  3. Right ventricular systolic function is normal. The right ventricular  size is normal.  4. No left atrial/left atrial appendage thrombus was detected. The LAA  emptying velocity was 75 cm/s.  5. The mitral valve is normal in structure. Mild mitral valve  regurgitation. No evidence of mitral stenosis.  6. Tricuspid  valve regurgitation is mild to moderate.  7. The aortic valve is tricuspid. Aortic valve regurgitation is not  visualized. No aortic stenosis is present.  8. There is mild (Grade II) layered plaque involving the descending  aorta.  _______________  Vascular Ultrasound with ABI 01/03/2021: Summary: - Right: Resting right ankle-brachial index indicates noncompressible right  lower extremity arteries. The right toe-brachial index is abnormal.  - Left: Resting left ankle-brachial index indicates noncompressible left  lower extremity arteries. The left toe-brachial index is abnormal.   Patient Profile     65 y.o. female with a history of heart failure with preserved EF, paroxysmal SVT, hypertension, GERD, COPD, MGUS, ESRD with renal transplant in 2013 on immunosuppressants and followed at [facility]. Patient was admitted on 12/16/2020 for diverticulitis after failing outpatient therapy. Cardiology was initially consulted for hypotension on 12/18/2020. Ultimately determined to be from septic shock secondary to perforated sigmoid diverticulitis. Underwent Hartman's resection on 2/6. Also developed new onset atrial flutter in setting of acute illness and underwent TEE/DCCV on 2/3.   Assessment & Plan    New Onset Atrial Flutter - Went into atrial flutter on 1/31 in setting of acute illness. - S/p TEE/DCCV on 2/3. Maintaining sinus rhythm.  - Continue Lopressor 25mg  twice daily. - Ideally would be on uninterrupted anticoagulation with DOAC for 30 days post DCCV. However, given diverticulitis, she is currently on IV Heparin. Dr. Rayann Heman discussed this with Dr. Algis Liming - plan is to continue IV Heparin for now unless anemia worsens or patient has active bleeding. Once more stable, will convert to Eliquis.  Acute on Chronic Diastolic CHF - BNP elevated at 1,624. - Initial chest x-ray showed vascular congestion and small bilateral pleural effusion but no overt edema.  - Echo showed LVEF of 70-75% with grade  2 diastolic dysfunction. - IV Lasix was restarted yesterday at 60mg  twice daily by Nephrology. Documented urinary output of 2.05 L yesterday. - Will defer diuresis to Nephrology given ESRD s/p transplant. - Low albumin also likely playing a role and generalized edema. - Continue to monitor daily weight, strict I/O's, and renal function.  Hypertension - BP mostly well controlled.  - Continue Amlodipine and Lopressor. On Amlodipine primary for Raynaud's.  History of ESRD s/p Renal Transplant in 2013 Now with Acute on CKD Stage III - Creatinine peaked at 2.74 on 1/30 and then downtrended to baseline but has started creeping back up the last couple of days. Stable at 1.78 today (1.84 yesterday). Baseline around 1.1 to 1.5. - Nephrology following.  Otherwise, per primary team: - Septic shock secondary to perforated diverticulitis - Adrenal insufficiency - Anemia - Type 2 diabetes  - Hypernatremia: resolved - Hyperkalemia: received 1 dose of hyperkalemia; resolved - Raynaud's phenomenon   For questions or updates, please contact [place] Please consult [email or web address] for contact info under        Signed, Darreld Mclean, PA-C  01/06/2021, 8:01 AM

## 2021-01-06 NOTE — Progress Notes (Signed)
Patient ID: April [name], female   DOB: 1956/02/24, 65 y.o.   MRN: [id number] [facility] Progress Note   Assessment/ Plan:   1. Acute kidney Injury on chronic kidney disease stage IIIaT: (Status post renal transplant in 2013 with baseline creatinine 1.1-1.5).  Her initial acute kidney injury appears to have been from hypotension/sepsis with significant third spacing and creatinine peaked around 2.9.  She appears to have acute kidney injury associated with volume excess and likely decompensated diastolic heart failure at this time for which she was started on diuretics yesterday with reasonable urine output and unchanged versus slightly improved creatinine.  She does not have any acute indications for dialysis at this time and will keep following her closely with ongoing immunosuppression for renal transplant. 2.  Acute sigmoid diverticulitis with perforation: Status post sigmoid colectomy with Hartman's procedure on 12/29/2020.  Status post completion of intravenous Zosyn and diet switched back to full liquids after minimal output through Hartman's pouch. 3.  Adrenal insufficiency: Temporarily off corticosteroids and now restarted back on prednisone 7.5 mg daily. 4.  Anemia of critical illness: I will check iron studies with labs tomorrow morning now that she has had satisfactory infection control and correct deficiency before undertaking ESA. 5.  Acute exacerbation of diastolic heart failure: With clinical evidence of exacerbation, ongoing diuretic therapy for volume unloading. 6.  Atrial flutter with RVR: Status post cardioversion and currently in sinus rhythm/rate controlled on metoprolol.  Subjective:   Reports to be feeling fatigued and having some nausea at this time.  Denies any abdominal pain.   Objective:   BP (!) 119/55   Pulse 73   Temp 97.9 F (36.6 C) (Axillary)   Resp 16   Ht 5\' 1"  (1.549 m)   Wt 80.6 kg   SpO2 100%   BMI 33.57 kg/m   Intake/Output Summary  (Last 24 hours) at 01/06/2021 1400 Last data filed at 01/06/2021 1328 Gross per 24 hour  Intake 86.62 ml  Output 2050 ml  Net -1963.38 ml   Weight change:   Physical Exam: Gen: Chronically ill-appearing and lethargic/fatigued resting in bed CVS: Pulse regular rhythm, normal rate, S1 and S2 normal Resp: Fine rales over bases-poor respiratory excursion.  No wheeze Abd: Soft, obese, left lower quadrant ostomy site with intact bag Ext: 3+ anasarca  Imaging: No results found.  Labs: BMET Recent Labs  Lab 12/31/20 0530 01/01/21 0457 01/02/21 0256 01/03/21 0300 01/04/21 0556 01/05/21 0511 01/06/21 0400  NA 139 138 137  135 146* 132* 134* 135  K 4.2 4.9 4.7  4.7 5.5* 4.8 4.6 4.9  CL 104 104 102  101 110 98 101 100  CO2 25 25 24  25  18* 24 24 24   GLUCOSE 96 104* 165*  166* 162* 139* 144* 197*  BUN 76* 69* 64*  62* 62* 67* 77* 76*  CREATININE 1.41* 1.43* 1.37*  1.32* 1.48* 1.48* 1.84* 1.78*  CALCIUM 9.6 9.4 9.6  9.4 9.5 9.5 9.2 9.2  PHOS 3.5 4.5 4.3  4.4  --  4.3 4.2 3.7   CBC Recent Labs  Lab 12/31/20 0530 01/01/21 0457 01/01/21 1204 01/02/21 0256 01/03/21 0720 01/04/21 0556 01/05/21 0511 01/05/21 1202 01/06/21 1018  WBC 11.6* 15.3*  --  19.3*   < > 14.9* 10.7* 12.8* 12.7*  NEUTROABS 10.6* 14.5*  --  17.8*  --   --   --   --   --   HGB 7.6* 6.7*   < > 8.9*   < >  8.1* 7.4* 8.2* 8.6*  HCT 24.0* 22.1*   < > 27.9*   < > 25.6* 23.7* 27.6* 27.7*  MCV 101.3* 106.8*  --  103.3*   < > 102.4* 103.9* 108.7* 104.1*  PLT 104* 109*  --  121*   < > 108* 93* 120* 117*   < > = values in this interval not displayed.    Medications:    . (feeding supplement) PROSource Plus  30 mL Oral BID BM  . amLODipine  5 mg Oral Daily  . brimonidine  1 drop Both Eyes BID  . Chlorhexidine Gluconate Cloth  6 each Topical Daily  . cycloSPORINE  1 drop Both Eyes BID  . dorzolamide  1 drop Both Eyes Daily  . feeding supplement (NEPRO CARB STEADY)  237 mL Oral BID BM  . furosemide  60  mg Intravenous Q12H  . insulin aspart  0-5 Units Subcutaneous QHS  . insulin aspart  0-9 Units Subcutaneous TID WC  . latanoprost  1 drop Both Eyes QHS  . mouth rinse  15 mL Mouth Rinse BID  . metoprolol tartrate  25 mg Oral BID  . mycophenolate  180 mg Oral BID  . pantoprazole  40 mg Oral Daily  . predniSONE  7.5 mg Oral Q breakfast  . tacrolimus  1 mg Oral BID  . valACYclovir  500 mg Oral BID  . cyanocobalamin  1,000 mcg Oral Daily   Elmarie Shiley, MD 01/06/2021, 2:00 PM

## 2021-01-06 NOTE — Progress Notes (Signed)
ANTICOAGULATION CONSULT NOTE - Follow Up Consult  Pharmacy Consult for Heparin Indication: atrial fibrillation  Allergies  Allergen Reactions  . Infed [Iron Dextran] Other (See Comments)    Chest tightness  . Pentamidine Itching, Shortness Of Breath and Swelling  . Budesonide-Formoterol Fumarate     Other reaction(s): hoarseness  . Bupropion     Other reaction(s): exacerbated depression  . Crestor [Rosuvastatin]     Other reaction(s): body aches, swelling  . Duloxetine Hcl     Other reaction(s): GI SE, weepy, worsening fatigue, foggy  . Erythromycin [Erythromycin] Other (See Comments)    Mouth Ulcers  . Iohexol Other (See Comments)    Per patient "she has had a kidney transplant and should never have contrast"  . Oxycodone Nausea Only and Nausea And Vomiting  . Pravastatin Sodium     Other reaction(s): myalgias  . Erythromycin Rash    Causes breakout in mouth  . Ultram [Tramadol Hcl] Anxiety    Patient Measurements: Height: 5\' 1"  (154.9 cm) Weight: 80.6 kg (177 lb 11.1 oz) IBW/kg (Calculated) : 47.8 Heparin Dosing Weight: 65 kg  Vital Signs: Temp: 98.7 F (37.1 C) (02/14 0400) Temp Source: Axillary (02/14 0400) BP: 119/55 (02/14 0600) Pulse Rate: 73 (02/14 0600)  Labs: Recent Labs    01/04/21 0556 01/04/21 1315 01/05/21 0511 01/05/21 1202 01/05/21 1329 01/06/21 0400  HGB 8.1*  --  7.4* 8.2*  --   --   HCT 25.6*  --  23.7* 27.6*  --   --   PLT 108*  --  93* 120*  --   --   HEPARINUNFRC 0.84*   < > 0.67  --  0.55 0.45  CREATININE 1.48*  --  1.84*  --   --  1.78*   < > = values in this interval not displayed.    Estimated Creatinine Clearance: 31.1 mL/min (A) (by C-G formula based on SCr of 1.78 mg/dL (H)).   Medications:  Scheduled:  . (feeding supplement) PROSource Plus  30 mL Oral BID BM  . amLODipine  5 mg Oral Daily  . brimonidine  1 drop Both Eyes BID  . Chlorhexidine Gluconate Cloth  6 each Topical Daily  . cycloSPORINE  1 drop Both Eyes BID   . dorzolamide  1 drop Both Eyes Daily  . feeding supplement (NEPRO CARB STEADY)  237 mL Oral BID BM  . furosemide  60 mg Intravenous Q12H  . insulin aspart  0-5 Units Subcutaneous QHS  . insulin aspart  0-9 Units Subcutaneous TID WC  . latanoprost  1 drop Both Eyes QHS  . mouth rinse  15 mL Mouth Rinse BID  . metoprolol tartrate  25 mg Oral BID  . mycophenolate  180 mg Oral BID  . pantoprazole  40 mg Oral Daily  . predniSONE  7.5 mg Oral Q breakfast  . tacrolimus  1 mg Oral BID  . valACYclovir  500 mg Oral BID  . cyanocobalamin  1,000 mcg Oral Daily   Infusions:  . sodium chloride Stopped (01/02/21 2107)  . heparin 250 Units/hr (01/06/21 0511)    Assessment: 65 yo female on apixaban PTA and last inpatient dose of apixaban was on 2/5 presented with perforated acute sigmoid diverticulitis s/p Hartman's on 2/6 has had oozing from wound site and anticoagulation has not been restarted as a result until today 2/11 when [facility] stated could be restarted if Hgb remained stable (8.9 > 8.8). Md ordered to start IV heparin per Rx, will not give  bolus due to oozing and s/p multiple blood transfusions the last few days  Today, 01/06/2021:  HL 0.45 on 250 units/hr  Hgb low but stable at 8.2, pltc stable at 120  No bleeding reported  Goal of Therapy:  Heparin level 0.3-0.5 units/ml Monitor platelets by anticoagulation protocol: Yes   Plan:   Continue heparin IV infusion at 250 units/hr  Daily heparin level and CBC  Continue to monitor H&H and platelets   Peggyann Juba, PharmD, BCPS Pharmacy: [phone] 01/06/2021, 7:49 AM

## 2021-01-07 DIAGNOSIS — N179 Acute kidney failure, unspecified: Secondary | ICD-10-CM | POA: Diagnosis not present

## 2021-01-07 LAB — RENAL FUNCTION PANEL
Albumin: 2.2 g/dL — ABNORMAL LOW (ref 3.5–5.0)
Anion gap: 10 (ref 5–15)
BUN: 74 mg/dL — ABNORMAL HIGH (ref 8–23)
CO2: 26 mmol/L (ref 22–32)
Calcium: 9.3 mg/dL (ref 8.9–10.3)
Chloride: 101 mmol/L (ref 98–111)
Creatinine, Ser: 1.45 mg/dL — ABNORMAL HIGH (ref 0.44–1.00)
GFR, Estimated: 41 mL/min — ABNORMAL LOW (ref >60)
Glucose, Bld: 132 mg/dL — ABNORMAL HIGH (ref 70–99)
Phosphorus: 3.1 mg/dL (ref 2.5–4.6)
Potassium: 4.4 mmol/L (ref 3.5–5.1)
Sodium: 137 mmol/L (ref 135–145)

## 2021-01-07 LAB — CBC
HCT: 24.1 % — ABNORMAL LOW (ref 36.0–46.0)
Hemoglobin: 7.5 g/dL — ABNORMAL LOW (ref 12.0–15.0)
MCH: 32.3 pg (ref 26.0–34.0)
MCHC: 31.1 g/dL (ref 30.0–36.0)
MCV: 103.9 fL — ABNORMAL HIGH (ref 80.0–100.0)
Platelets: 106 10*3/uL — ABNORMAL LOW (ref 150–400)
RBC: 2.32 MIL/uL — ABNORMAL LOW (ref 3.87–5.11)
RDW: 19.6 % — ABNORMAL HIGH (ref 11.5–15.5)
WBC: 10.1 10*3/uL (ref 4.0–10.5)
nRBC: 0 % (ref 0.0–0.2)

## 2021-01-07 LAB — GLUCOSE, CAPILLARY
Glucose-Capillary: 79 mg/dL (ref 70–99)
Glucose-Capillary: 202 mg/dL — ABNORMAL HIGH (ref 70–99)
Glucose-Capillary: 234 mg/dL — ABNORMAL HIGH (ref 70–99)
Glucose-Capillary: 164 mg/dL — ABNORMAL HIGH (ref 70–99)

## 2021-01-07 LAB — HEPARIN LEVEL (UNFRACTIONATED): Heparin Unfractionated: 0.43 IU/mL (ref 0.30–0.70)

## 2021-01-07 MED ORDER — FUROSEMIDE 40 MG PO TABS
40.0000 mg | ORAL_TABLET | Freq: Two times a day (BID) | ORAL | Status: DC
  Administered 2021-01-07 – 2021-01-14 (×15): 40 mg via ORAL
  Filled 2021-01-07 (×15): qty 1

## 2021-01-07 MED ORDER — SODIUM CHLORIDE 0.9 % IV SOLN
25.0000 mg | Freq: Once | INTRAVENOUS | Status: DC
  Filled 2021-01-07: qty 2

## 2021-01-07 MED ORDER — DARBEPOETIN ALFA 100 MCG/0.5ML IJ SOSY
100.0000 ug | PREFILLED_SYRINGE | INTRAMUSCULAR | Status: DC
  Administered 2021-01-07: 100 ug via SUBCUTANEOUS
  Filled 2021-01-07 (×2): qty 0.5

## 2021-01-07 NOTE — Progress Notes (Signed)
Pt removed cpap stating that it was to tight. Cpap powered off will continue to monitor.

## 2021-01-07 NOTE — Progress Notes (Signed)
Physical Therapy Treatment Patient Details Name: April [name] MRN: [id number] DOB: 1956-02-05 Today's Date: 01/07/2021    History of Present Illness April [name] is a 65 y.o. 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 Admitted to the hospital started on antibiotics developed atrial fibrillation with RVR.  Had a successful TEE with [facility] cardioversion.  On the morning of 12/29/2020 patient developed acute abdominal pain had CT imaging that was concerning for free air within the belly and a perforated diverticulitis s/p Hartman's and intubation on 2/6. Extubated on 2/8.    PT Comments    Patient willing to sit on bed edge, requiring 2 max assisting. Attempted to stand but deemed not safe due to bed height. Then made attempt to slide to dropped arm recliner, patient starting to lean. Assisted back into bed and maxisky lift used for OOB to recliner. Continue progressive  Mobility.  Follow Up Recommendations  SNF     Equipment Recommendations  None recommended by PT    Recommendations for Other Services       Precautions / Restrictions Precautions Precaution Comments: hx of falling, hip surgery, LLQ colostomy Restrictions Weight Bearing Restrictions: No    Mobility  Bed Mobility   Bed Mobility: Rolling;Sidelying to Sit;Sit to Sidelying Rolling: Mod assist;+2 for physical assistance Sidelying to sit: Max assist;+2 for physical assistance;+2 for safety/equipment;HOB elevated     Sit to sidelying: Total assist;+2 for physical assistance;+2 for safety/equipment General bed mobility comments: patient more lethargic. 2   max assist to sit up on bed edge. Made attempt to stand and/or slide to recliner. Patient not offering assistance so returned to supine. Maxisky lift to recliner.    Transfers                     Ambulation/Gait                 Stairs             Wheelchair Mobility    Modified Rankin (Stroke Patients Only)       Balance   Sitting-balance support: Feet supported;Bilateral upper extremity supported Sitting balance-Leahy Scale: Poor Sitting balance - Comments: limited sitting on bed edge, starting to lean                                    Cognition Arousal/Alertness: Lethargic Behavior During Therapy: WFL for tasks assessed/performed Overall Cognitive Status: Within Functional Limits for tasks assessed                                        Exercises      General Comments        Pertinent Vitals/Pain Faces Pain Scale: Hurts whole lot Pain Location: abdomen Pain Descriptors / Indicators: Grimacing;Guarding;Moaning Pain Intervention(s): Limited activity within patient's tolerance;Monitored during session    Home Living                      Prior Function            PT Goals (current goals can now be found in the care plan section) Progress towards PT goals: Progressing toward goals    Frequency  Min 2X/week      PT Plan Current plan remains appropriate;Discharge plan needs to be updated;Frequency needs to be updated    Co-evaluation              AM-PAC PT "6 Clicks" Mobility   Outcome Measure  Help needed turning from your back to your side while in a flat bed without using bedrails?: A Lot Help needed moving from lying on your back to sitting on the side of a flat bed without using bedrails?: A Lot Help needed moving to and from a bed to a chair (including a wheelchair)?: Total Help needed standing up from a chair using your arms (e.g., wheelchair or bedside chair)?: Total Help needed to walk in hospital room?: Total Help needed climbing 3-5 steps with a railing? : Total 6 Click Score: 8    End of Session   Activity Tolerance: Patient limited by fatigue;Patient limited by  pain Patient left: in chair;with call bell/phone within reach;with nursing/sitter in room Nurse Communication: Mobility status;Need for lift equipment PT Visit Diagnosis: Muscle weakness (generalized) (M62.81);Pain     Time: 1001-1028 PT Time Calculation (min) (ACUTE ONLY): 27 min  Charges:  $Therapeutic Activity: 23-37 mins                     Tresa Endo PT Acute Rehabilitation Services Pager [phone] Office [phone]    Claretha Cooper 01/07/2021, 11:44 AM

## 2021-01-07 NOTE — Progress Notes (Addendum)
ANTICOAGULATION CONSULT NOTE - Follow Up Consult  Pharmacy Consult for Heparin Indication: atrial fibrillation  Allergies  Allergen Reactions  . Infed [Iron Dextran] Other (See Comments)    Chest tightness  . Pentamidine Itching, Shortness Of Breath and Swelling  . Budesonide-Formoterol Fumarate     Other reaction(s): hoarseness  . Bupropion     Other reaction(s): exacerbated depression  . Crestor [Rosuvastatin]     Other reaction(s): body aches, swelling  . Duloxetine Hcl     Other reaction(s): GI SE, weepy, worsening fatigue, foggy  . Erythromycin [Erythromycin] Other (See Comments)    Mouth Ulcers  . Iohexol Other (See Comments)    Per patient "she has had a kidney transplant and should never have contrast"  . Oxycodone Nausea Only and Nausea And Vomiting  . Pravastatin Sodium     Other reaction(s): myalgias  . Erythromycin Rash    Causes breakout in mouth  . Ultram [Tramadol Hcl] Anxiety    Patient Measurements: Height: 5\' 1"  (154.9 cm) Weight: 80.6 kg (177 lb 11.1 oz) IBW/kg (Calculated) : 47.8 Heparin Dosing Weight: 65 kg  Vital Signs: Temp: 98.8 F (37.1 C) (02/15 0000) Temp Source: Oral (02/15 0000) BP: 106/46 (02/15 0400) Pulse Rate: 71 (02/15 0400)  Labs: Recent Labs    01/05/21 0511 01/05/21 1202 01/05/21 1329 01/06/21 0400 01/06/21 1018 01/07/21 0252  HGB 7.4* 8.2*  --   --  8.6* 7.5*  HCT 23.7* 27.6*  --   --  27.7* 24.1*  PLT 93* 120*  --   --  117* 106*  HEPARINUNFRC 0.67  --  0.55 0.45  --  0.43  CREATININE 1.84*  --   --  1.78*  --  1.45*    Estimated Creatinine Clearance: 38.2 mL/min (A) (by C-G formula based on SCr of 1.45 mg/dL (H)).   Medications:  Scheduled:  . (feeding supplement) PROSource Plus  30 mL Oral BID BM  . amLODipine  5 mg Oral Daily  . brimonidine  1 drop Both Eyes BID  . Chlorhexidine Gluconate Cloth  6 each Topical Daily  . cycloSPORINE  1 drop Both Eyes BID  . dorzolamide  1 drop Both Eyes Daily  . feeding  supplement (NEPRO CARB STEADY)  237 mL Oral BID BM  . furosemide  60 mg Intravenous Q12H  . insulin aspart  0-5 Units Subcutaneous QHS  . insulin aspart  0-9 Units Subcutaneous TID WC  . latanoprost  1 drop Both Eyes QHS  . mouth rinse  15 mL Mouth Rinse BID  . metoprolol tartrate  25 mg Oral BID  . mycophenolate  180 mg Oral BID  . pantoprazole  40 mg Oral Daily  . predniSONE  7.5 mg Oral Q breakfast  . tacrolimus  1 mg Oral BID  . valACYclovir  500 mg Oral BID  . cyanocobalamin  1,000 mcg Oral Daily   Infusions:  . sodium chloride Stopped (01/02/21 2107)  . heparin 250 Units/hr (01/06/21 1328)    Assessment: 65 yo female on apixaban PTA and last inpatient dose of apixaban was on 2/5, s/p TEE/cardioversion on 12/26/2020. Presented with perforated acute sigmoid diverticulitis s/p Hartman's on 2/6 has had oozing from wound site and anticoagulation has not been restarted as a result until 2/11 when [facility] stated could be restarted if Hgb remained stable (8.9 > 8.8). Md ordered to start IV heparin per Rx, will not give bolus due to oozing and s/p multiple blood transfusions the last few days.  Per Cards on 2/14, If no bleeding the next 24 hours recommend to restart Eliquis as recommended by surgery. Would prefer she had 4 weeks of uninterrupted anticoagulation after cardioversion.   Today, 01/07/2021:  HL 0.43 on 250 units/hr  Hgb decreased to 7.5, pltc decreasing again 106  CCS drained large L arm hematoma 2/14  Goal of Therapy:  Heparin level 0.3-0.5 units/ml Monitor platelets by anticoagulation protocol: Yes   Plan:   Continue heparin IV infusion at 250 units/hr  Daily heparin level and CBC  Continue to monitor H&H and platelets, signs/symptoms of bleeding   Peggyann Juba, PharmD, BCPS Pharmacy: [phone] 01/07/2021, 6:56 AM

## 2021-01-07 NOTE — Progress Notes (Signed)
Patient ID: April [name], female   DOB: [birth year], 65 y.o.   MRN: [id number] [facility] Progress Note   Assessment/ Plan:   1. Acute kidney Injury on chronic kidney disease stage IIIaT: (Status post renal transplant in 2013 with baseline creatinine 1.1-1.5).  Her initial acute kidney injury appears to have been from hypotension/sepsis with significant third spacing and creatinine peaked around 2.9.  Renal function improving with ongoing diuresis with etiology of AKI suspected to be from CHF decompensation; creatinine now back down to baseline and I will convert her from intravenous furosemide to oral furosemide today.  She does not have any acute indications for dialysis at this time and will keep following her closely with ongoing immunosuppression for renal transplant. 2.  Acute sigmoid diverticulitis with perforation: Status post sigmoid colectomy with Hartman's procedure on 12/29/2020.  Status post completion of intravenous Zosyn and seen earlier by surgery who will maintain her on full liquids until she has stool coming through. 3.  Adrenal insufficiency: Ongoing prednisone 7.5 mg daily (component of post transplant regimen). 4.  Anemia of critical illness: She has low iron saturation with a recorded history of allergy to iron dextran-I will order for a test dose of Ferrlecit and order ESA. 5.  Acute exacerbation of diastolic heart failure: With clinical evidence of exacerbation, ongoing diuretic therapy for volume unloading. 6.  Atrial flutter with RVR: Status post cardioversion and currently in sinus rhythm/rate controlled on metoprolol.  Subjective:   Denies any acute events overnight, denies any chest pain or shortness of breath   Objective:   BP (!) 108/52   Pulse 78   Temp 98.4 F (36.9 C) (Oral)   Resp 15   Ht 5\' 1"  (1.549 m)   Wt 80.6 kg   SpO2 100%   BMI 33.57 kg/m   Intake/Output Summary (Last 24 hours) at 01/07/2021 1355 Last data filed at 01/07/2021  1218 Gross per 24 hour  Intake 56.93 ml  Output 350 ml  Net -293.07 ml   Weight change:   Physical Exam: Gen: Chronically ill-appearing, resting in recliner-somnolent CVS: Pulse regular rhythm, normal rate, S1 and S2 normal Resp: Decreased breath sounds over bases-poor inspiratory effort.  No rales/rhonchi Abd: Soft, obese, left lower quadrant ostomy site with intact bag Ext: 1+ bilateral lower extremity edema with 1-2+ edema over her back  Imaging: No results found.  Labs: BMET Recent Labs  Lab 01/01/21 0457 01/02/21 0256 01/03/21 0300 01/04/21 0556 01/05/21 0511 01/06/21 0400 01/07/21 0252  NA 138 137  135 146* 132* 134* 135 137  K 4.9 4.7  4.7 5.5* 4.8 4.6 4.9 4.4  CL 104 102  101 110 98 101 100 101  CO2 25 24  25  18* 24 24 24 26   GLUCOSE 104* 165*  166* 162* 139* 144* 197* 132*  BUN 69* 64*  62* 62* 67* 77* 76* 74*  CREATININE 1.43* 1.37*  1.32* 1.48* 1.48* 1.84* 1.78* 1.45*  CALCIUM 9.4 9.6  9.4 9.5 9.5 9.2 9.2 9.3  PHOS 4.5 4.3  4.4  --  4.3 4.2 3.7 3.1   CBC Recent Labs  Lab 01/01/21 0457 01/01/21 1204 01/02/21 0256 01/03/21 0720 01/05/21 0511 01/05/21 1202 01/06/21 1018 01/07/21 0252  WBC 15.3*  --  19.3*   < > 10.7* 12.8* 12.7* 10.1  NEUTROABS 14.5*  --  17.8*  --   --   --   --   --   HGB 6.7*   < > 8.9*   < >  7.4* 8.2* 8.6* 7.5*  HCT 22.1*   < > 27.9*   < > 23.7* 27.6* 27.7* 24.1*  MCV 106.8*  --  103.3*   < > 103.9* 108.7* 104.1* 103.9*  PLT 109*  --  121*   < > 93* 120* 117* 106*   < > = values in this interval not displayed.    Medications:    . (feeding supplement) PROSource Plus  30 mL Oral BID BM  . amLODipine  5 mg Oral Daily  . brimonidine  1 drop Both Eyes BID  . Chlorhexidine Gluconate Cloth  6 each Topical Daily  . cycloSPORINE  1 drop Both Eyes BID  . darbepoetin (ARANESP) injection - NON-DIALYSIS  100 mcg Subcutaneous Q Tue-1800  . dorzolamide  1 drop Both Eyes Daily  . feeding supplement (NEPRO CARB STEADY)  237 mL  Oral BID BM  . furosemide  40 mg Oral BID  . insulin aspart  0-5 Units Subcutaneous QHS  . insulin aspart  0-9 Units Subcutaneous TID WC  . latanoprost  1 drop Both Eyes QHS  . mouth rinse  15 mL Mouth Rinse BID  . metoprolol tartrate  25 mg Oral BID  . mycophenolate  180 mg Oral BID  . pantoprazole  40 mg Oral Daily  . predniSONE  7.5 mg Oral Q breakfast  . tacrolimus  1 mg Oral BID  . valACYclovir  500 mg Oral BID  . cyanocobalamin  1,000 mcg Oral Daily   Elmarie Shiley, MD 01/07/2021, 1:55 PM

## 2021-01-07 NOTE — Progress Notes (Signed)
Dr. Posey Pronto ordered Ferrlecit. Unable to give due to limited IV access. Unable to get second IV access. Medication cannot run with Heparin and we cannot stop Heparin. Dr. Moshe Cipro was consulted and Dr. Moshe Cipro said to not to give it.

## 2021-01-07 NOTE — Progress Notes (Signed)
9 Days Post-Op   Subjective/Chief Complaint: Still with some nausea, but no vomiting More stool output in ostomy bag Hgb 8.6 > 7.5 today   Objective: Vital signs in last 24 hours: Temp:  [97.9 F (36.6 C)-98.8 F (37.1 C)] 98.8 F (37.1 C) (02/15 0000) Pulse Rate:  [68-79] 79 (02/15 0700) Resp:  [14-22] 22 (02/15 0700) BP: (96-111)/(43-48) 96/48 (02/15 0637) SpO2:  [89 %-99 %] 89 % (02/15 0700) FiO2 (%):  [28 %] 28 % (02/14 2349) Last BM Date: 01/02/21  Intake/Output from previous day: 02/14 0701 - 02/15 0700 In: 20.7 [I.V.:20.7] Out: 350 [Urine:350] Intake/Output this shift: No intake/output data recorded.  WD - awake alert Abd - soft, non-distended; midline tenderness Wound - unchanged; relatively clean Ostomy - pink, viable; soft stool coming from os Left arm - clean in dressing  Lab Results:  Recent Labs    01/06/21 1018 01/07/21 0252  WBC 12.7* 10.1  HGB 8.6* 7.5*  HCT 27.7* 24.1*  PLT 117* 106*   BMET Recent Labs    01/06/21 0400 01/07/21 0252  NA 135 137  K 4.9 4.4  CL 100 101  CO2 24 26  GLUCOSE 197* 132*  BUN 76* 74*  CREATININE 1.78* 1.45*  CALCIUM 9.2 9.3   PT/INR No results for input(s): LABPROT, INR in the last 72 hours. ABG No results for input(s): PHART, HCO3 in the last 72 hours.  Invalid input(s): PCO2, PO2  Studies/Results: No results found.  Anti-infectives: Anti-infectives (From admission, onward)   Start     Dose/Rate Route Frequency Ordered Stop   12/31/20 2200  valACYclovir (VALTREX) tablet 500 mg        500 mg Oral 2 times daily 12/31/20 2100     12/31/20 1000  valACYclovir (VALTREX) compounded oral suspension 500 mg  Status:  Discontinued        500 mg Per Tube 2 times daily 12/30/20 2051 12/31/20 2100   12/29/20 1045  piperacillin-tazobactam (ZOSYN) IVPB 3.375 g        3.375 g 12.5 mL/hr over 240 Minutes Intravenous Every 8 hours 12/29/20 0956 01/04/21 0152   12/24/20 0800  ciprofloxacin (CIPRO) tablet 500 mg   Status:  Discontinued        500 mg Oral Daily with breakfast 12/23/20 1721 12/23/20 1737   12/23/20 2200  metroNIDAZOLE (FLAGYL) tablet 500 mg  Status:  Discontinued        500 mg Oral Every 8 hours 12/23/20 1721 12/23/20 1737   12/23/20 2200  amoxicillin-clavulanate (AUGMENTIN) 500-125 MG per tablet 500 mg  Status:  Discontinued        1 tablet Oral 2 times daily 12/23/20 1739 12/29/20 0949   12/18/20 1100  Ampicillin-Sulbactam (UNASYN) 3 g in sodium chloride 0.9 % 100 mL IVPB  Status:  Discontinued        3 g 200 mL/hr over 30 Minutes Intravenous Every 12 hours 12/18/20 0729 12/23/20 1721   12/17/20 1400  Ampicillin-Sulbactam (UNASYN) 3 g in sodium chloride 0.9 % 100 mL IVPB  Status:  Discontinued        3 g 200 mL/hr over 30 Minutes Intravenous Every 6 hours 12/17/20 1004 12/18/20 0729   12/16/20 2200  ciprofloxacin (CIPRO) IVPB 400 mg  Status:  Discontinued        400 mg 200 mL/hr over 60 Minutes Intravenous Every 12 hours 12/16/20 1451 12/16/20 1734   12/16/20 2200  piperacillin-tazobactam (ZOSYN) IVPB 3.375 g  Status:  Discontinued  3.375 g 12.5 mL/hr over 240 Minutes Intravenous Every 8 hours 12/16/20 1734 12/17/20 0949   12/16/20 1800  metroNIDAZOLE (FLAGYL) IVPB 500 mg  Status:  Discontinued        500 mg 100 mL/hr over 60 Minutes Intravenous Every 8 hours 12/16/20 1451 12/16/20 1734   12/16/20 1515  valACYclovir (VALTREX) tablet 500 mg  Status:  Discontinued        500 mg Oral 2 times daily 12/16/20 1505 12/30/20 2048   12/16/20 0915  ciprofloxacin (CIPRO) IVPB 400 mg        400 mg 200 mL/hr over 60 Minutes Intravenous  Once 12/16/20 0912 12/16/20 1104   12/16/20 0915  metroNIDAZOLE (FLAGYL) IVPB 500 mg        500 mg 100 mL/hr over 60 Minutes Intravenous  Once 12/16/20 0912 12/16/20 1104      Assessment/Plan: Polycystic disease with renal transplant 2013-Prograf/prednisone. Renalfollowing Elevated Cr CHF/uncertain etiology -Cards following Chronic COPD/history  of tobacco use- On 2L PRN at home Pleuritic chest pain and SOB- VQ 1/25:Low probability pulmonary embolus. Age indeterminate, non occlusive deep vein thrombosis involving the right popliteal vein Hx SVT Atrial flutter with RVR- on eliquis, which is currently on hold. Likely reason for elevated INR of 1.8 yesterday and some oozing Hyponatremia - Per medicine - Large hematoma left arm - drained and dressed Decision on transfusion per Hospitalists and Renal  Perforated sigmoid diverticulitis Exploratory laparotomy with sigmoid colectomy/end colostomy, 12/29/2020 DR. Coralie Keens   FEN: Renal carb modified; taking mostly liquids >> full liquids for now ID: Zosyn 2/6-2/12 DVT: Heparin Follow-up: Dr. Ninfa Linden   Plan:  I placed her back on full liquids, and would keep her there until she actually has some stool coming thru.  Work on mobilizing her more.  OT/PT.  Transfer to Telemetry  LOS: 22 days    Maia Petties 01/07/2021

## 2021-01-07 NOTE — Progress Notes (Addendum)
PROGRESS NOTE   April [name]  ENI:[id number]    DOB: Apr 20, 1956    DOA: 12/16/2020  PCP: [name], MD   I have briefly reviewed patients previous medical records in [facility].  Chief Complaint  Patient presents with  . Abdominal Pain    Brief Narrative:  PCCM to [facility] transfer 01/02/2021: 65 year old female with medical history of severe persistent asthma, chronic respiratory failure with hypoxia on home oxygen 2 L/min as needed, ESRD s/p deceased donor renal transplant in 2013 on immunosuppression and followed at [facility], chronic diastolic CHF, MGUS, HTN, recently seen in the ER for uncomplicated sigmoid diverticulitis, returned to the hospital with abdominal pain and fever.  She was admitted to the hospital and started on antibiotics, developed atrial fibrillation with RVR.  She underwent successful TEE with [facility] cardioversion.  On the morning of 12/29/2020, patient developed acute abdominal pain and CT imaging concerning for free air within the abdomen from perforated diverticulitis. S/p Hartman's procedure, postop remained on ventilator and vasopressors.  Stabilized and care transferred to [facility].  Postop course complicated by slow to return bowel functions, recurrent acute on chronic kidney disease with volume overload, IV antibiotics discontinued, attempting to mobilize.  Surgery, nephrology continue to assist.  Cardiology signed off 2/14.  Transferring to telemetry pending bed.   Assessment & Plan:  Active Problems:   GERD without esophagitis   Renal transplant recipient   Depression   Sepsis ([place])   SOB (shortness of breath)   Acute diverticulitis   Sigmoid diverticulitis   Acute CHF (congestive heart failure) (HCC)   Bilious vomiting with nausea   Difficult intravenous access   Acute respiratory failure with hypoxia secondary to septic shock due to perforated diverticulitis and peritonitis complicating underlying COPD, chronic respiratory failure with hypoxia: She was  managed by critical care in the ICU.  Intubated, extubated 2/8.  Shock resolved and weaned off vasopressors.  Mobilize with PT.  Incentive spirometry.  Patient states that she only uses oxygen as needed 2 L/min.  Saturation goal: 88-92%.  Completed Zosyn 2/11.  Remains on valacyclovir (renal transplant).  Weaned to room air this morning.  Perforated acute sigmoid diverticulitis s/p Hartman's on 2/6 General surgery follow-up appreciated.  Completed IV Zosyn 2/11.  Not much output through Hartman's and hence general surgery have changed diet back to full liquids until she actually has some stool coming through ostomy.  Stool output noted through ostomy.  Also s/p I&D of large left arm hematoma by [facility] 2/14.  Mobilize, PT seen today and recommending SNF.  Suspected OSA OSA suspected based on apneic episodes at night.  Per PCCM, continue CPAP for now at night while hospitalized but eventually will need sleep study as an outpatient.  Outpatient follow-up with pulmonology/Dr. Vaughan Browner is her primary pulmonologist.  Type II DM with hyperglycemia Complicated by steroids.  Off Lantus due to hypoglycemic episodes.  Mildly uncontrolled and fluctuating.  May need to adjust insulins.  Atrial flutter (typical) with RVR, s/p TEE and DCCV on 12/25/2020/prior SVT  Currently in sinus rhythm.  Apixaban was discontinued on 2/6 for her abdominal surgery.  Anticoagulation had not been resumed due to slow oozing from the wound, needed blood transfusion on 2/7 and 2/9.  As per discussion with general surgery, resumed IV heparin 2/11 and if no further bleeding/oozing, stable hemoglobin then consider switching to Eliquis.  Continue metoprolol 25 mg twice daily.  EP cardiology follow-up appreciated.  CHA2DS2-VASc score: At least 3.  Following cardioversion, she will  require at least 30 days of uninterrupted [place] therapy.  Could consider eventual elective ablation to hopefully reduce need for long-term [place] therapy (they are not aware  of her having a history of A. fib).  EP cardiology indicates that since she is followed by [facility] closely post transplant, she may benefit from seeing [facility] for follow-up.  Due to recent DCCV, recommend uninterrupted anticoagulation unless worsening anemia or active bleed.  Hemoglobin has again dropped from 8.6-7.5.  Continue IV heparin for now until hemoglobin stable, recent I&D left upper arm hematoma and at risk for recurrent bleeding, tenuous GI issues where diet had to be downgraded, there was discussion by CCS about I&D of surgical incision site.  Cardiology signed off 2/14.  Outpatient follow-up with them.  Acute on chronic diastolic CHF Volume overloaded and weight gain.  Improved after IV Lasix for a couple days and transitioning to oral Lasix 2/15.  Acute kidney injury complicating CKD stage III a: Baseline creatinine 1.1-1.5.  AKI due to volume depletion from nausea and vomiting, hypotension, sepsis, third spacing and acute on chronic diastolic CHF.  Serum creatinine peaked to 2.7.  Creatinine has gone up from 1.32-1.48 on 2/11 along with hypernatremia and hyperkalemia.  Nephrology signed off 2/9.  Creatinine had reduced to 1.4 range with IV fluids but back up again to 1.84.  Recurrent AKI attributed to CHF decompensation and improved with IV Lasix, being transitioned to oral Lasix by nephrology-input much appreciated.  Creatinine down to baseline 1.4.  Continue to follow daily BMP.    Hypernatremia Resolved.  Hyperkalemia Give a dose of Lokelma, change diet to low potassium diet.  Resolved  Adrenal insufficiency: Low a.m. cortisol, renin/aldosterone in process.  Blood pressures normal.  Transitioned from IV Solu-Medrol to prior home dose of prednisone 7.5 mg daily on 2/13.  S/p renal transplant Per nephrology recommendations, continue Prograf/Myfortic and prior home dose of prednisone.  Tacrolimus level 2/4: 6 (normal).    Postop acute blood loss anemia: S/p transfusions.   Hemoglobin dropped from 8.6-7.5 in the absence of overt bleeding.  Per nephrology, low iron saturation, recorded history of allergy to iron dextran, giving a test dose of Ferrlecit and giving ESA.  Follow CBC in the a.m. and transfuse if hemoglobin 7 g or less.  Thrombocytopenia: Platelet count relatively stable.  Follow CBC.    Difficult peripheral IV access Per IV team, no options for peripheral IV.  PCCM consulted for central line.  No peripheral PICC or midline due to history of renal transplant and chronic kidney disease.  It appears that they were able to finally find a peripheral IV line and no central line was needed for now.  Eventually may need a central line.  PCCM aware.  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  Bilateral toe brachial indices abnormal.  Etiology unclear.  Discussed with RN to try and keep fingers and toes socks and gloves.  Trial of low-dose amlodipine 5 mg daily.  Toes and fingers improved.  Body mass index is 33.57 kg/m.  Nutritional Status Nutrition Problem: Inadequate oral intake Etiology: inability to eat Signs/Symptoms: NPO status Interventions: Refer to RD note for recommendations  Pressure Injury 12/29/20 Sacrum Medial Stage 2 -  Partial thickness loss of dermis presenting as a shallow open injury with a red, pink wound bed without slough. (Active)  12/29/20 1130  Location: Sacrum  Location Orientation: Medial  Staging: Stage 2 -  Partial thickness loss of  dermis presenting as a shallow open injury with a red, pink wound bed without slough.  Wound Description (Comments):   Present on Admission: No      DVT prophylaxis: Place and maintain sequential compression device Start: 12/31/20 1004. Has been on IV heparin infusion.   Code Status: Full Code Family Communication: I discussed with patient's daughter on 2/15, updated care and answered questions.  She was appreciative of the  call. Disposition:  Status is: Inpatient  Remains inpatient appropriate because:Inpatient level of care appropriate due to severity of illness   Dispo:  Patient From: Home  Planned Disposition: [place]  Expected discharge date: 01/10/2021  Medically stable for discharge: No          Consultants:   General surgery Cardiology Nephrology PCCM  Procedures:   12/29/2020: Exploratory laparotomy, sigmoid colectomy and end colostomy, central venous line, arterial line. 12/31/2020: Extubated. Foley catheter-to be discontinued 2/10  Significant Hospital events: 1/24 Admit 2/2 TEE guided cardioversion 2/6 Hartman's procedure for perf diverticulitis, remains on vent post op 2/8 Extubated, PRBC for low Hb   Antimicrobials:    Anti-infectives (From admission, onward)   Start     Dose/Rate Route Frequency Ordered Stop   12/31/20 2200  valACYclovir (VALTREX) tablet 500 mg        500 mg Oral 2 times daily 12/31/20 2100     12/31/20 1000  valACYclovir (VALTREX) compounded oral suspension 500 mg  Status:  Discontinued        500 mg Per Tube 2 times daily 12/30/20 2051 12/31/20 2100   12/29/20 1045  piperacillin-tazobactam (ZOSYN) IVPB 3.375 g        3.375 g 12.5 mL/hr over 240 Minutes Intravenous Every 8 hours 12/29/20 0956 01/04/21 0152   12/24/20 0800  ciprofloxacin (CIPRO) tablet 500 mg  Status:  Discontinued        500 mg Oral Daily with breakfast 12/23/20 1721 12/23/20 1737   12/23/20 2200  metroNIDAZOLE (FLAGYL) tablet 500 mg  Status:  Discontinued        500 mg Oral Every 8 hours 12/23/20 1721 12/23/20 1737   12/23/20 2200  amoxicillin-clavulanate (AUGMENTIN) 500-125 MG per tablet 500 mg  Status:  Discontinued        1 tablet Oral 2 times daily 12/23/20 1739 12/29/20 0949   12/18/20 1100  Ampicillin-Sulbactam (UNASYN) 3 g in sodium chloride 0.9 % 100 mL IVPB  Status:  Discontinued        3 g 200 mL/hr over 30 Minutes Intravenous Every 12 hours 12/18/20 0729  12/23/20 1721   12/17/20 1400  Ampicillin-Sulbactam (UNASYN) 3 g in sodium chloride 0.9 % 100 mL IVPB  Status:  Discontinued        3 g 200 mL/hr over 30 Minutes Intravenous Every 6 hours 12/17/20 1004 12/18/20 0729   12/16/20 2200  ciprofloxacin (CIPRO) IVPB 400 mg  Status:  Discontinued        400 mg 200 mL/hr over 60 Minutes Intravenous Every 12 hours 12/16/20 1451 12/16/20 1734   12/16/20 2200  piperacillin-tazobactam (ZOSYN) IVPB 3.375 g  Status:  Discontinued        3.375 g 12.5 mL/hr over 240 Minutes Intravenous Every 8 hours 12/16/20 1734 12/17/20 0949   12/16/20 1800  metroNIDAZOLE (FLAGYL) IVPB 500 mg  Status:  Discontinued        500 mg 100 mL/hr over 60 Minutes Intravenous Every 8 hours 12/16/20 1451 12/16/20 1734   12/16/20 1515  valACYclovir (VALTREX) tablet  500 mg  Status:  Discontinued        500 mg Oral 2 times daily 12/16/20 1505 12/30/20 2048   12/16/20 0915  ciprofloxacin (CIPRO) IVPB 400 mg        400 mg 200 mL/hr over 60 Minutes Intravenous  Once 12/16/20 0912 12/16/20 1104   12/16/20 0915  metroNIDAZOLE (FLAGYL) IVPB 500 mg        500 mg 100 mL/hr over 60 Minutes Intravenous  Once 12/16/20 0912 12/16/20 1104        Subjective:  Ongoing intermittent abdominal pain rated at 5/10 in severity.  Off oxygen this morning.  Hand and leg swelling improved after IV Lasix.  No other complaints reported.  Objective:   Vitals:   01/07/21 1200 01/07/21 1230 01/07/21 1300 01/07/21 1400  BP: (!) 108/52   (!) 116/54  Pulse: 78  74 67  Resp: 15  17 15   Temp:  98.4 F (36.9 C)    TempSrc:  Oral    SpO2: 100%  100% 100%  Weight:      Height:        General exam: Middle-age female, moderately built and obese sitting up comfortably in bed.  Does not appear in any distress. Respiratory system: Clear to auscultation.  No increased work of breathing. Cardiovascular system: S1 and S2 heard, RRR.  No JVD.  Trace bilateral ankle edema.  Telemetry personally reviewed: Sinus  rhythm. Gastrointestinal system: Abdomen is nondistended, soft and nontender. No organomegaly or masses felt. Normal bowel sounds heard.  Dressing over surgical site clean and dry.  Left Hartman's pouch little minimally bloodstained liquid. Central nervous system: Alert and oriented. No focal neurological deficits. Extremities: Symmetric 5 x 5 power.  Bilateral toes and fingers much improved, faintly dusky.  Palpable distal pulses. Skin: Fingers and toes as noted above. Psychiatry: Judgement and insight appear normal. Mood & affect appropriate.     Data Reviewed:   I have personally reviewed following labs and imaging studies   CBC: Recent Labs  Lab 01/01/21 0457 01/01/21 1204 01/02/21 0256 01/03/21 0720 01/05/21 1202 01/06/21 1018 01/07/21 0252  WBC 15.3*  --  19.3*   < > 12.8* 12.7* 10.1  NEUTROABS 14.5*  --  17.8*  --   --   --   --   HGB 6.7*   < > 8.9*   < > 8.2* 8.6* 7.5*  HCT 22.1*   < > 27.9*   < > 27.6* 27.7* 24.1*  MCV 106.8*  --  103.3*   < > 108.7* 104.1* 103.9*  PLT 109*  --  121*   < > 120* 117* 106*   < > = values in this interval not displayed.    Basic Metabolic Panel: Recent Labs  Lab 01/02/21 0256 01/03/21 0300 01/05/21 0511 01/06/21 0400 01/07/21 0252  NA 137  135   < > 134* 135 137  K 4.7  4.7   < > 4.6 4.9 4.4  CL 102  101   < > 101 100 101  CO2 24  25   < > 24 24 26   GLUCOSE 165*  166*   < > 144* 197* 132*  BUN 64*  62*   < > 77* 76* 74*  CREATININE 1.37*  1.32*   < > 1.84* 1.78* 1.45*  CALCIUM 9.6  9.4   < > 9.2 9.2 9.3  MG 2.1  --   --   --   --   PHOS 4.3  4.4   < > 4.2 3.7 3.1   < > = values in this interval not displayed.    Liver Function Tests: Recent Labs  Lab 01/01/21 0457 01/02/21 0256 01/03/21 0300 01/04/21 0556 01/05/21 0511 01/06/21 0400 01/07/21 0252  AST 12* 12* 11*  --   --   --   --   ALT 11 11 11   --   --   --   --   ALKPHOS 43 48 53  --   --   --   --   BILITOT 1.8* 1.6* 1.4*  --   --   --   --    PROT 5.0* 5.4* 5.6*  --   --   --   --   ALBUMIN 2.3* 2.3*  2.3* 2.3*   < > 2.1* 2.2* 2.2*   < > = values in this interval not displayed.    CBG: Recent Labs  Lab 01/06/21 2041 01/07/21 0800 01/07/21 1131  GLUCAP 181* 79 202*    Microbiology Studies:   No results found for this or any previous visit (from the past 240 hour(s)).   Radiology Studies:  No results found.   Scheduled Meds:   . (feeding supplement) PROSource Plus  30 mL Oral BID BM  . amLODipine  5 mg Oral Daily  . brimonidine  1 drop Both Eyes BID  . Chlorhexidine Gluconate Cloth  6 each Topical Daily  . cycloSPORINE  1 drop Both Eyes BID  . darbepoetin (ARANESP) injection - NON-DIALYSIS  100 mcg Subcutaneous Q Tue-1800  . dorzolamide  1 drop Both Eyes Daily  . feeding supplement (NEPRO CARB STEADY)  237 mL Oral BID BM  . furosemide  40 mg Oral BID  . insulin aspart  0-5 Units Subcutaneous QHS  . insulin aspart  0-9 Units Subcutaneous TID WC  . latanoprost  1 drop Both Eyes QHS  . mouth rinse  15 mL Mouth Rinse BID  . metoprolol tartrate  25 mg Oral BID  . mycophenolate  180 mg Oral BID  . pantoprazole  40 mg Oral Daily  . predniSONE  7.5 mg Oral Q breakfast  . tacrolimus  1 mg Oral BID  . valACYclovir  500 mg Oral BID  . cyanocobalamin  1,000 mcg Oral Daily    Continuous Infusions:   . sodium chloride Stopped (01/02/21 2107)  . ferric gluconate (FERRLECIT/NULECIT) Test Dose    . heparin 250 Units/hr (01/07/21 1218)     LOS: 22 days     Vernell Leep, MD, [place], [facility]. [facility]    To contact the attending provider between 7A-7P or the covering provider during after hours 7P-7A, please log into the web site [email or web address] and access using universal [place] password for that web site. If you do not have the password, please call the hospital operator.  01/07/2021, 2:49 PM

## 2021-01-08 DIAGNOSIS — Z9189 Other specified personal risk factors, not elsewhere classified: Secondary | ICD-10-CM

## 2021-01-08 DIAGNOSIS — D62 Acute posthemorrhagic anemia: Secondary | ICD-10-CM

## 2021-01-08 DIAGNOSIS — Z940 Kidney transplant status: Secondary | ICD-10-CM | POA: Diagnosis not present

## 2021-01-08 DIAGNOSIS — R5381 Other malaise: Secondary | ICD-10-CM

## 2021-01-08 DIAGNOSIS — I5031 Acute diastolic (congestive) heart failure: Secondary | ICD-10-CM | POA: Diagnosis not present

## 2021-01-08 DIAGNOSIS — E119 Type 2 diabetes mellitus without complications: Secondary | ICD-10-CM

## 2021-01-08 DIAGNOSIS — Z933 Colostomy status: Secondary | ICD-10-CM

## 2021-01-08 DIAGNOSIS — K5732 Diverticulitis of large intestine without perforation or abscess without bleeding: Secondary | ICD-10-CM | POA: Diagnosis not present

## 2021-01-08 DIAGNOSIS — N179 Acute kidney failure, unspecified: Secondary | ICD-10-CM | POA: Diagnosis not present

## 2021-01-08 DIAGNOSIS — Z9889 Other specified postprocedural states: Secondary | ICD-10-CM

## 2021-01-08 LAB — GLUCOSE, CAPILLARY
Glucose-Capillary: 88 mg/dL (ref 70–99)
Glucose-Capillary: 166 mg/dL — ABNORMAL HIGH (ref 70–99)
Glucose-Capillary: 233 mg/dL — ABNORMAL HIGH (ref 70–99)
Glucose-Capillary: 172 mg/dL — ABNORMAL HIGH (ref 70–99)

## 2021-01-08 LAB — COMPREHENSIVE METABOLIC PANEL
ALT: 8 U/L (ref 0–44)
AST: 10 U/L — ABNORMAL LOW (ref 15–41)
Albumin: 2.3 g/dL — ABNORMAL LOW (ref 3.5–5.0)
Alkaline Phosphatase: 62 U/L (ref 38–126)
Anion gap: 8 (ref 5–15)
BUN: 73 mg/dL — ABNORMAL HIGH (ref 8–23)
CO2: 28 mmol/L (ref 22–32)
Calcium: 9.1 mg/dL (ref 8.9–10.3)
Chloride: 99 mmol/L (ref 98–111)
Creatinine, Ser: 1.45 mg/dL — ABNORMAL HIGH (ref 0.44–1.00)
GFR, Estimated: 41 mL/min — ABNORMAL LOW (ref >60)
Glucose, Bld: 167 mg/dL — ABNORMAL HIGH (ref 70–99)
Potassium: 4.3 mmol/L (ref 3.5–5.1)
Sodium: 135 mmol/L (ref 135–145)
Total Bilirubin: 1.5 mg/dL — ABNORMAL HIGH (ref 0.3–1.2)
Total Protein: 5.4 g/dL — ABNORMAL LOW (ref 6.5–8.1)

## 2021-01-08 LAB — RENAL FUNCTION PANEL
Albumin: 2.1 g/dL — ABNORMAL LOW (ref 3.5–5.0)
Anion gap: 8 (ref 5–15)
BUN: 81 mg/dL — ABNORMAL HIGH (ref 8–23)
CO2: 27 mmol/L (ref 22–32)
Calcium: 9.3 mg/dL (ref 8.9–10.3)
Chloride: 100 mmol/L (ref 98–111)
Creatinine, Ser: 1.44 mg/dL — ABNORMAL HIGH (ref 0.44–1.00)
GFR, Estimated: 41 mL/min — ABNORMAL LOW (ref >60)
Glucose, Bld: 127 mg/dL — ABNORMAL HIGH (ref 70–99)
Phosphorus: 3.1 mg/dL (ref 2.5–4.6)
Potassium: 4.4 mmol/L (ref 3.5–5.1)
Sodium: 135 mmol/L (ref 135–145)

## 2021-01-08 LAB — CBC
HCT: 24 % — ABNORMAL LOW (ref 36.0–46.0)
Hemoglobin: 7.4 g/dL — ABNORMAL LOW (ref 12.0–15.0)
MCH: 32.3 pg (ref 26.0–34.0)
MCHC: 30.8 g/dL (ref 30.0–36.0)
MCV: 104.8 fL — ABNORMAL HIGH (ref 80.0–100.0)
Platelets: 91 10*3/uL — ABNORMAL LOW (ref 150–400)
RBC: 2.29 MIL/uL — ABNORMAL LOW (ref 3.87–5.11)
RDW: 19.9 % — ABNORMAL HIGH (ref 11.5–15.5)
WBC: 8.4 10*3/uL (ref 4.0–10.5)
nRBC: 0 % (ref 0.0–0.2)

## 2021-01-08 LAB — HEPARIN LEVEL (UNFRACTIONATED): Heparin Unfractionated: 0.29 IU/mL — ABNORMAL LOW (ref 0.30–0.70)

## 2021-01-08 MED ORDER — NEPRO/CARBSTEADY PO LIQD
237.0000 mL | Freq: Three times a day (TID) | ORAL | Status: DC
  Administered 2021-01-08 – 2021-01-10 (×5): 237 mL via ORAL
  Filled 2021-01-08 (×7): qty 237

## 2021-01-08 NOTE — Progress Notes (Signed)
ANTICOAGULATION CONSULT NOTE - Follow Up Consult  Pharmacy Consult for Heparin Indication: atrial fibrillation  Allergies  Allergen Reactions  . Infed [Iron Dextran] Other (See Comments)    Chest tightness  . Pentamidine Itching, Shortness Of Breath and Swelling  . Budesonide-Formoterol Fumarate     Other reaction(s): hoarseness  . Bupropion     Other reaction(s): exacerbated depression  . Crestor [Rosuvastatin]     Other reaction(s): body aches, swelling  . Duloxetine Hcl     Other reaction(s): GI SE, weepy, worsening fatigue, foggy  . Erythromycin [Erythromycin] Other (See Comments)    Mouth Ulcers  . Iohexol Other (See Comments)    Per patient "she has had a kidney transplant and should never have contrast"  . Oxycodone Nausea Only and Nausea And Vomiting  . Pravastatin Sodium     Other reaction(s): myalgias  . Erythromycin Rash    Causes breakout in mouth  . Ultram [Tramadol Hcl] Anxiety    Patient Measurements: Height: 5\' 1"  (154.9 cm) Weight: 80.6 kg (177 lb 11.1 oz) IBW/kg (Calculated) : 47.8 Heparin Dosing Weight: 65 kg  Vital Signs: Temp: 98.2 F (36.8 C) (02/15 2321) Temp Source: Axillary (02/15 2321) BP: 115/44 (02/16 0200) Pulse Rate: 66 (02/16 0200)  Labs: Recent Labs    01/05/21 0511 01/05/21 1202 01/05/21 1329 01/06/21 0400 01/06/21 1018 01/07/21 0252 01/08/21 0322  HGB 7.4* 8.2*  --   --  8.6* 7.5*  --   HCT 23.7* 27.6*  --   --  27.7* 24.1*  --   PLT 93* 120*  --   --  117* 106*  --   HEPARINUNFRC 0.67  --    < > 0.45  --  0.43 0.29*  CREATININE 1.84*  --   --  1.78*  --  1.45*  --    < > = values in this interval not displayed.    Estimated Creatinine Clearance: 38.2 mL/min (A) (by C-G formula based on SCr of 1.45 mg/dL (H)).   Medications:  Scheduled:  . (feeding supplement) PROSource Plus  30 mL Oral BID BM  . amLODipine  5 mg Oral Daily  . brimonidine  1 drop Both Eyes BID  . Chlorhexidine Gluconate Cloth  6 each Topical Daily   . cycloSPORINE  1 drop Both Eyes BID  . darbepoetin (ARANESP) injection - NON-DIALYSIS  100 mcg Subcutaneous Q Tue-1800  . dorzolamide  1 drop Both Eyes Daily  . feeding supplement (NEPRO CARB STEADY)  237 mL Oral BID BM  . furosemide  40 mg Oral BID  . insulin aspart  0-5 Units Subcutaneous QHS  . insulin aspart  0-9 Units Subcutaneous TID WC  . latanoprost  1 drop Both Eyes QHS  . mouth rinse  15 mL Mouth Rinse BID  . metoprolol tartrate  25 mg Oral BID  . mycophenolate  180 mg Oral BID  . pantoprazole  40 mg Oral Daily  . predniSONE  7.5 mg Oral Q breakfast  . tacrolimus  1 mg Oral BID  . valACYclovir  500 mg Oral BID  . cyanocobalamin  1,000 mcg Oral Daily   Infusions:  . sodium chloride Stopped (01/02/21 2107)  . ferric gluconate (FERRLECIT/NULECIT) Test Dose    . heparin 250 Units/hr (01/08/21 0000)    Assessment: 65 yo female on apixaban PTA and last inpatient dose of apixaban was on 2/5, s/p TEE/cardioversion on 12/26/2020. Presented with perforated acute sigmoid diverticulitis s/p Hartman's on 2/6 has had oozing from wound  site and anticoagulation has not been restarted as a result until 2/11 when [facility] stated could be restarted if Hgb remained stable (8.9 > 8.8). Md ordered to start IV heparin per Rx, will not give bolus due to oozing and s/p multiple blood transfusions the last few days.    Per Cards on 2/14, If no bleeding the next 24 hours recommend to restart Eliquis as recommended by surgery. Would prefer she had 4 weeks of uninterrupted anticoagulation after cardioversion.   Today, 01/08/2021:  HL 0.29 - now just below goal range on 250 units/hr despite therapeutic levels since 2/13  Hgb decreased to 7.4, pltc decreasing again 91  CCS drained large L arm hematoma 2/14  No bleeding or infusion related concerns per RN  Goal of Therapy:  Heparin level 0.3-0.5 units/ml Monitor platelets by anticoagulation protocol: Yes   Plan:   Continue heparin IV infusion at 250  units/hr  Daily heparin level and CBC  Continue to monitor H&H and platelets, signs/symptoms of bleeding  F/U plans to switch back to DOAC -->will consider recheck heparin level this afternoon if heparin not changed to Eliquis.    Netta Cedars, PharmD, BCPS Pharmacy: [phone] 01/08/2021, 4:25 AM

## 2021-01-08 NOTE — Progress Notes (Addendum)
Pharmacy - IV heparin  Assessment:    Please see note from Lavonia Drafts) Irven Easterly, PharmD earlier today for full details.  Briefly, 65 y.o. female on anticoagulation for chronic DVT and recent DCCV (12/25/20). PTA Eliquis was switched to heparin for ex lap to repair perforated sigmoid diverticulitis. Patient is overall very frail, with continued oozing at midline incision, and large L arm/hand hematoma.   Heparin level this AM borderline low, rate not adjusted d/t bleeding concerns  Heparin rate remains extremely low (by comparison, would receive more than twice this daily dose from standard SQ heparin prophylaxis dosing)  Had just discussed anticoagulation plans with [facility] MD when RN called stating heparin stopped d/t blood travelling back up IV line  Plan:   Continue to hold heparin pending MD review  Will re-evaluate anticoag plans tomorrow (or sooner if clinical status changes)  Reuel Boom, PharmD, BCPS [phone] 01/08/2021, 12:48 PM

## 2021-01-08 NOTE — Progress Notes (Signed)
Patient ID: April [name], female   DOB: 1956-07-17, 65 y.o.   MRN: [id number] [facility] Progress Note   Assessment/ Plan:   1. Acute kidney Injury on chronic kidney disease stage IIIaT: (Status post renal transplant in 2013 with baseline creatinine 1.1-1.5).  Her initial acute kidney injury appears to have been from hypotension/sepsis with significant third spacing and creatinine peaked around 2.9.  Renal function is now back to baseline with ongoing oral diuretics (at her home dose).  She does not have any acute indications for dialysis at this time and will keep following her closely with ongoing immunosuppression for renal transplant. 2.  Acute sigmoid diverticulitis with perforation: Status post sigmoid colectomy with Hartman's procedure on 12/29/2020.  Status post completion of intravenous Zosyn and seen earlier by surgery who plan to advance her to a soft diet/nutrition consult.  She has significant deconditioning and will need intensive rehabilitation prior to discharge home. 3.  Adrenal insufficiency: Ongoing prednisone 7.5 mg daily (component of post transplant regimen). 4.  Anemia of critical illness: She has low iron saturation with intravenous iron ordered yesterday but unable to be given due to limited intravenous access.  Aranesp given yesterday. 5.  Acute exacerbation of diastolic heart failure: With clinical evidence of exacerbation, ongoing diuretic therapy for volume unloading. 6.  Atrial flutter with RVR: Status post cardioversion and currently in sinus rhythm/rate controlled on metoprolol.  With renal function back to baseline, will sign off and remain available for questions and concerns.  Will confirm that she has follow-up following discharge from current hospitalization.  Subjective:   Reports some intermittent abdominal pain but denies any other complaints.   Objective:   BP (!) 124/96 (BP Location: Right Arm)   Pulse 75   Temp 97.9 F (36.6 C) (Oral)    Resp 16   Ht 5\' 1"  (1.549 m)   Wt 80.6 kg   SpO2 96%   BMI 33.57 kg/m   Intake/Output Summary (Last 24 hours) at 01/08/2021 1305 Last data filed at 01/08/2021 1240 Gross per 24 hour  Intake 173.34 ml  Output 1075 ml  Net -901.66 ml   Weight change:   Physical Exam: Gen: Appears comfortable resting in bed-more communicative but visibly fatigued and chronically ill CVS: Pulse regular rhythm, normal rate, S1 and S2 normal Resp: Clear to auscultation anteriorly-poor inspiratory effort.  No rales/rhonchi Abd: Soft, obese, left lower quadrant ostomy site with intact bag Ext: 1+ bilateral lower extremity edema with 2+ edema over her back  Imaging: No results found.  Labs: BMET Recent Labs  Lab 01/02/21 0256 01/03/21 0300 01/04/21 0556 01/05/21 0511 01/06/21 0400 01/07/21 0252 01/08/21 0322 01/08/21 1021  NA 137  135 146* 132* 134* 135 137 135 135  K 4.7  4.7 5.5* 4.8 4.6 4.9 4.4 4.4 4.3  CL 102  101 110 98 101 100 101 100 99  CO2 24  25 18* 24 24 24 26 27 28   GLUCOSE 165*  166* 162* 139* 144* 197* 132* 127* 167*  BUN 64*  62* 62* 67* 77* 76* 74* 81* 73*  CREATININE 1.37*  1.32* 1.48* 1.48* 1.84* 1.78* 1.45* 1.44* 1.45*  CALCIUM 9.6  9.4 9.5 9.5 9.2 9.2 9.3 9.3 9.1  PHOS 4.3  4.4  --  4.3 4.2 3.7 3.1 3.1  --    CBC Recent Labs  Lab 01/02/21 0256 01/03/21 0720 01/05/21 1202 01/06/21 1018 01/07/21 0252 01/08/21 0322  WBC 19.3*   < > 12.8* 12.7* 10.1 8.4  NEUTROABS 17.8*  --   --   --   --   --   HGB 8.9*   < > 8.2* 8.6* 7.5* 7.4*  HCT 27.9*   < > 27.6* 27.7* 24.1* 24.0*  MCV 103.3*   < > 108.7* 104.1* 103.9* 104.8*  PLT 121*   < > 120* 117* 106* 91*   < > = values in this interval not displayed.    Medications:    . (feeding supplement) PROSource Plus  30 mL Oral BID BM  . amLODipine  5 mg Oral Daily  . brimonidine  1 drop Both Eyes BID  . Chlorhexidine Gluconate Cloth  6 each Topical Daily  . cycloSPORINE  1 drop Both Eyes BID  . darbepoetin  (ARANESP) injection - NON-DIALYSIS  100 mcg Subcutaneous Q Tue-1800  . dorzolamide  1 drop Both Eyes Daily  . feeding supplement (NEPRO CARB STEADY)  237 mL Oral BID BM  . furosemide  40 mg Oral BID  . insulin aspart  0-5 Units Subcutaneous QHS  . insulin aspart  0-9 Units Subcutaneous TID WC  . latanoprost  1 drop Both Eyes QHS  . mouth rinse  15 mL Mouth Rinse BID  . metoprolol tartrate  25 mg Oral BID  . mycophenolate  180 mg Oral BID  . pantoprazole  40 mg Oral Daily  . predniSONE  7.5 mg Oral Q breakfast  . tacrolimus  1 mg Oral BID  . valACYclovir  500 mg Oral BID  . cyanocobalamin  1,000 mcg Oral Daily   Elmarie Shiley, MD 01/08/2021, 1:05 PM

## 2021-01-08 NOTE — Progress Notes (Addendum)
Nutrition Follow-up  DOCUMENTATION CODES:   Obesity unspecified  INTERVENTION:  - continue 30 ml Prosource Plus BID, each supplement provides 100 kcal and 15 grams protein.  - will increase Nepro Shake from BID to TID, each supplement provides 425 kcal and 19 grams protein.  NUTRITION DIAGNOSIS:   Increased nutrient needs related to acute illness as evidenced by estimated needs. -revised, ongoing  GOAL:   Patient will meet greater than or equal to 90% of their needs -unmet on average   MONITOR:   PO intake,Supplement acceptance,Labs,Weight trends,Skin  REASON FOR ASSESSMENT:   Consult Assessment of nutrition requirement/status  ASSESSMENT:   65 year old female with medical history of severe persistent asthma, chronic hypoxemic respiratory failure on 2L [place], ESRD s/p deceased donor renal transplant in 2013 on immune suppression followed by [facility], CHF, MGUS, and HTN.  She was recently seen in the ED due to uncomplicated sigmoid diverticulitis. She returned to the ED with pain and fever, admitted and started on abx, and developed afib with RVR. She has undergone TEE and cardioversion this admission. The morning of 2/6 patient developed acute abdominal pain and CT showed concern for free air in the abdomen and perforated diverticulitis; underwent Hartman's procedure. She developed acute hypoxia 2/2 septic shock related to perforation and peritonitis.  No intakes documented since diet advancement began on 2/9. Nepro Shake and Prosource Plus both ordered BID and patient has been accepting these supplements 100% of the time offered (provides a daily total of 1050 kcal and 68 grams protein).   Weight has been mainly stable since 1/29. Moderate pitting edema to all extremities documented in the edema section of flow sheet.  Estimated nutrition needs were updated on 2/11.  Patient is s/p I&D of L arm hematoma on 2/14.   MD note from today states that anticipated d/c date is 01/13/21.      Labs reviewed; CBGs: 88 and 166 mg/dl, BUN: 73 mg/dl, creatinine: 1.45 mg/dl, GFR: 41 mg/dl.  Medications reviewed; 40 mg IV lasix BID, sliing scale novolog, 40 mg oral protonix/day, 7.5 mg deltasone/day, 1000 mcg oral cyanocobalamin/day.     Diet Order:   Diet Order            DIET SOFT Room service appropriate? Yes; Fluid consistency: Thin  Diet effective now                 EDUCATION NEEDS:   No education needs have been identified at this time  Skin:  Skin Assessment: Skin Integrity Issues: Skin Integrity Issues:: Stage II,Other (Comment),Incisions Stage II: sacrum Incisions: abdomen (2/6) Other: MASD bilateral buttocks  Last BM:  2/16 (125 from rectal tube)  Height:   Ht Readings from Last 1 Encounters:  12/29/20 5\' 1"  (1.549 m)    Weight:   Wt Readings from Last 1 Encounters:  01/04/21 80.6 kg     Estimated Nutritional Needs:  Kcal:  1900-2100 Protein:  90-100 grams Fluid:  >1.9L     Jarome Matin, MS, RD, LDN, CNSC Inpatient Clinical Dietitian RD pager # available in [facility]  After hours/weekend pager # available in [facility]

## 2021-01-08 NOTE — Progress Notes (Signed)
April NOTE  April [name] FHL:[id number] DOB: 23-Sep-1956   PCP: [name], MD  Patient is from: Home  DOA: 12/16/2020 LOS: 2  Chief complaints: Abdominal pain and fever  Brief Narrative / Interim history: 65 year old F with PMH of severe persistent asthma, chronic hypoxic RF on 2 L as needed, ESRD s/p deceased renal transplant in 03-14-12 on immunomodulators and followed at [place], diastolic CHF, MGUS and HTN admitted for uncomplicated sigmoid diverticulitis and new onset A. fib with RVR.  She underwent TEE with DCCV on 2/6 but developed acute abdominal pain.  CT showed perforated diverticulitis.  She underwent laparotomy with Hartman's procedure but required MV and vasopressors postop.  She also stabilized in ICU and transferred back to [facility] service point 01/12/2021.  Postop complicated by slow return of bowel functions, recurrent acute on chronic kidney disease, volume overload and acute blood loss anemia.  Cardiology recommended anticoagulation at least for 3 weeks post DCCV.  Nephrology and general surgery following.  Subjective: Seen and examined earlier this morning.  No major events overnight of this morning.  Complains 4/10 abdominal pain which is slightly improved from yesterday.  She denies nausea or vomiting.  She denies chest pain or dyspnea.  Objective: Vitals:   01/08/21 0600 01/08/21 0700 01/08/21 0800 01/08/21 0926  BP: (!) 125/54  (!) 125/55 (!) 124/96  Pulse: 70 72 73 75  Resp: 17 (!) 24 20 16   Temp:   97.9 F (36.6 C) 97.9 F (36.6 C)  TempSrc:   Oral Oral  SpO2: 90% 97% 100% 96%  Weight:      Height:        Intake/Output Summary (Last 24 hours) at 01/08/2021 1246 Last data filed at 01/08/2021 1240 Gross per 24 hour  Intake 173.34 ml  Output 1075 ml  Net -901.66 ml   Filed Weights   12/28/20 0500 12/29/20 0500 01/04/21 0500  Weight: 77 kg 77.2 kg 80.6 kg    Examination:  GENERAL: No apparent distress.  Nontoxic. HEENT: MMM.  Vision and hearing  grossly intact.  NECK: Supple.  No apparent JVD.  RESP: On RA.  No IWOB.  Fair aeration bilaterally. CVS:  RRR. Heart sounds normal.  ABD/GI/GU: BS+. Abd soft.  Dressing over laparotomy wound DCI.  Normal output from ostomy. MSK/EXT:  Moves extremities. No apparent deformity.  Edema in BUE, Rt>Lt with extravasation SKIN: Dressing over laparotomy wound DCI.  Normal looking output from ostomy NEURO: Awake, alert and oriented appropriately.  No apparent focal neuro deficit. PSYCH: Calm. Normal affect.  Procedures:  2/6-exploratory laparotomy, sigmoid colectomy and end colostomy 2/6-TEE with DCCV for A. Fib 2/6-2/8-ETT 2/14-I&D of left arm hematoma by CCS  Microbiology summarized: 2/2-COVID-19 PCR nonreactive.  Assessment & Plan: Acute on chronic hypoxemic respiratory failure due to septic shock in the setting of perforated diverticulitis/peritonitis-seems to have resolved. -ETT 2/6-2/8. -Completed antibiotic course with IV Zosyn on 2/11. -OOB/PT/OT/incentive spirometry  Septic shock due to perforated acute sigmoid diverticulitis s/p ex lap, sigmoid colectomy and end colostomy -Completed antibiotic course with IV Zosyn. -Ostomy care per surgery and wound care  A flutter with RVR s/p TEE and DCCV on 12/29/2020  -Continue metoprolol 25 mg twice daily -Cardiology recommendations:  -Uninterrupted AC for 30 days post DCCV-continue IV heparin for now  -Elective ablation  -Outpatient follow-up with [facility] EP team   Acute blood loss anemia superimposed on anemia of renal disease-surgical blood loss and hematoma to left arm Recent Labs    01/01/21 0457 01/01/21 1204 01/02/21  9470 01/03/21 0720 01/04/21 0556 01/05/21 0511 01/05/21 1202 01/06/21 1018 01/07/21 0252 01/08/21 0322  HGB 6.7* 8.1* 8.9* 8.8* 8.1* 7.4* 8.2* 8.6* 7.5* 7.4*  -Monitor H&H while on IV heparin  Acute on chronic diastolic CHF: TTE ordered 1/25 with LVEF of 70 to 75%, G2 DD.  Some evidence of fluid overload with  bilateral lower extremity edema, Rt>Lt.  -Continue oral Lasix -Monitor fluid status, renal functions and electrolytes  AKI on CKD-3A/azotemia in patient with Hx of ESRD s/p deceased donor transplant in March 09, 2012:  Cr seems to be stable at~1.4.  Followed at [facility]. Recent Labs    12/31/20 0530 01/01/21 0457 01/02/21 0256 01/03/21 0300 01/04/21 0556 01/05/21 0511 01/06/21 0400 01/07/21 0252 01/08/21 0322 01/08/21 1021  BUN 76* 69* 64*  62* 62* 67* 77* 76* 74* 81* 73*  CREATININE 1.41* 1.43* 1.37*  1.32* 1.48* 1.48* 1.84* 1.78* 1.45* 1.44* 1.45*  -Monitor H&H -Appreciate help by nephrology -Continue home Prograf, Myfortic and home prednisone  NIDDM-2 with renal disease and hyperglycemia Recent Labs  Lab 01/07/21 1131 01/07/21 1537 01/07/21 2125-03-09 01/08/21 0758 01/08/21 1119  GLUCAP 202* 234* 164* 88 166*  -Continue current insulin regimen  Hypernatremia/hyperkalemia: Resolved.  Adrenal insufficiency: Likely in the setting of acute illness -On home prednisone.  Thrombocytopenia: Platelets 91. -Continue monitoring  Raynaud's phenomenon, toes >fingers: reports being told to have Raynaud's by her GI MD. -TBI abnormal. -Continue low-dose amlodipine  Suspected OSA/chronic COPD -Nightly CPAP while in-house -Needs outpatient sleep study. -Outpatient follow-up with pulmonology/Dr. Vaughan Browner is her primary pulmonologist.   Age-indeterminate nonocclusive DVT of right PV-may not need treatment for this but Rx for DVT given immobility.  VQ scan low probability for PE. -Anticoagulation as above  Debility/physical deconditioning in the setting of acute illness -PT/OT recommended SNF  Nutritional Status Body mass index is 33.57 kg/m. Nutrition Problem: Inadequate oral intake Etiology: inability to eat Signs/Symptoms: NPO status Interventions: Refer to RD note for recommendations   Stage II sacral injury: Pressure Injury 12/29/20 Sacrum Medial Stage 2 -  Partial thickness  loss of dermis presenting as a shallow open injury with a red, pink wound bed without slough. (Active)  12/29/20 1130  Location: Sacrum  Location Orientation: Medial  Staging: Stage 2 -  Partial thickness loss of dermis presenting as a shallow open injury with a red, pink wound bed without slough.  Wound Description (Comments):   Present on Admission: No   DVT prophylaxis:  Place and maintain sequential compression device Start: 12/31/20 1004  Code Status: Full code Family Communication: Patient and/or RN. Available if any question.  Level of care: Telemetry Status is: Inpatient  Remains inpatient appropriate because:Unsafe d/c plan, IV treatments appropriate due to intensity of illness or inability to take PO and Inpatient level of care appropriate due to severity of illness   Dispo:  Patient From: Home  Planned Disposition: [place]  Expected discharge date: 01/13/2021  Medically stable for discharge: No         Consultants:  General surgery Nephrology PCCM-signed off Cardiology-signed off   Sch Meds:  Scheduled Meds: . (feeding supplement) PROSource Plus  30 mL Oral BID BM  . amLODipine  5 mg Oral Daily  . brimonidine  1 drop Both Eyes BID  . Chlorhexidine Gluconate Cloth  6 each Topical Daily  . cycloSPORINE  1 drop Both Eyes BID  . darbepoetin (ARANESP) injection - NON-DIALYSIS  100 mcg Subcutaneous Q Tue-1800  . dorzolamide  1 drop Both Eyes Daily  .  feeding supplement (NEPRO CARB STEADY)  237 mL Oral BID BM  . furosemide  40 mg Oral BID  . insulin aspart  0-5 Units Subcutaneous QHS  . insulin aspart  0-9 Units Subcutaneous TID WC  . latanoprost  1 drop Both Eyes QHS  . mouth rinse  15 mL Mouth Rinse BID  . metoprolol tartrate  25 mg Oral BID  . mycophenolate  180 mg Oral BID  . pantoprazole  40 mg Oral Daily  . predniSONE  7.5 mg Oral Q breakfast  . tacrolimus  1 mg Oral BID  . valACYclovir  500 mg Oral BID  . cyanocobalamin  1,000 mcg  Oral Daily   Continuous Infusions: . sodium chloride Stopped (01/02/21 2107)  . ferric gluconate (FERRLECIT/NULECIT) Test Dose    . heparin Stopped (01/08/21 1231)   PRN Meds:.sodium chloride, acetaminophen, fentaNYL (SUBLIMAZE) injection, hydrALAZINE, lip balm, naLOXone (NARCAN)  injection, [DISCONTINUED] ondansetron **OR** ondansetron (ZOFRAN) IV, oxyCODONE  Antimicrobials: Anti-infectives (From admission, onward)   Start     Dose/Rate Route Frequency Ordered Stop   12/31/20 2200  valACYclovir (VALTREX) tablet 500 mg        500 mg Oral 2 times daily 12/31/20 2100     12/31/20 1000  valACYclovir (VALTREX) compounded oral suspension 500 mg  Status:  Discontinued        500 mg Per Tube 2 times daily 12/30/20 2051 12/31/20 2100   12/29/20 1045  piperacillin-tazobactam (ZOSYN) IVPB 3.375 g        3.375 g 12.5 mL/hr over 240 Minutes Intravenous Every 8 hours 12/29/20 0956 01/04/21 0152   12/24/20 0800  ciprofloxacin (CIPRO) tablet 500 mg  Status:  Discontinued        500 mg Oral Daily with breakfast 12/23/20 1721 12/23/20 1737   12/23/20 2200  metroNIDAZOLE (FLAGYL) tablet 500 mg  Status:  Discontinued        500 mg Oral Every 8 hours 12/23/20 1721 12/23/20 1737   12/23/20 2200  amoxicillin-clavulanate (AUGMENTIN) 500-125 MG per tablet 500 mg  Status:  Discontinued        1 tablet Oral 2 times daily 12/23/20 1739 12/29/20 0949   12/18/20 1100  Ampicillin-Sulbactam (UNASYN) 3 g in sodium chloride 0.9 % 100 mL IVPB  Status:  Discontinued        3 g 200 mL/hr over 30 Minutes Intravenous Every 12 hours 12/18/20 0729 12/23/20 1721   12/17/20 1400  Ampicillin-Sulbactam (UNASYN) 3 g in sodium chloride 0.9 % 100 mL IVPB  Status:  Discontinued        3 g 200 mL/hr over 30 Minutes Intravenous Every 6 hours 12/17/20 1004 12/18/20 0729   12/16/20 2200  ciprofloxacin (CIPRO) IVPB 400 mg  Status:  Discontinued        400 mg 200 mL/hr over 60 Minutes Intravenous Every 12 hours 12/16/20 1451 12/16/20  1734   12/16/20 2200  piperacillin-tazobactam (ZOSYN) IVPB 3.375 g  Status:  Discontinued        3.375 g 12.5 mL/hr over 240 Minutes Intravenous Every 8 hours 12/16/20 1734 12/17/20 0949   12/16/20 1800  metroNIDAZOLE (FLAGYL) IVPB 500 mg  Status:  Discontinued        500 mg 100 mL/hr over 60 Minutes Intravenous Every 8 hours 12/16/20 1451 12/16/20 1734   12/16/20 1515  valACYclovir (VALTREX) tablet 500 mg  Status:  Discontinued        500 mg Oral 2 times daily 12/16/20 1505 12/30/20 2048   12/16/20  0915  ciprofloxacin (CIPRO) IVPB 400 mg        400 mg 200 mL/hr over 60 Minutes Intravenous  Once 12/16/20 0912 12/16/20 1104   12/16/20 0915  metroNIDAZOLE (FLAGYL) IVPB 500 mg        500 mg 100 mL/hr over 60 Minutes Intravenous  Once 12/16/20 0912 12/16/20 1104       I have personally reviewed the following labs and images: CBC: Recent Labs  Lab 01/02/21 0256 01/03/21 0720 01/05/21 0511 01/05/21 1202 01/06/21 1018 01/07/21 0252 01/08/21 0322  WBC 19.3*   < > 10.7* 12.8* 12.7* 10.1 8.4  NEUTROABS 17.8*  --   --   --   --   --   --   HGB 8.9*   < > 7.4* 8.2* 8.6* 7.5* 7.4*  HCT 27.9*   < > 23.7* 27.6* 27.7* 24.1* 24.0*  MCV 103.3*   < > 103.9* 108.7* 104.1* 103.9* 104.8*  PLT 121*   < > 93* 120* 117* 106* 91*   < > = values in this interval not displayed.   BMP &GFR Recent Labs  Lab 01/02/21 0256 01/03/21 0300 01/04/21 0556 01/05/21 0511 01/06/21 0400 01/07/21 0252 01/08/21 0322 01/08/21 1021  NA 137  135   < > 132* 134* 135 137 135 135  K 4.7  4.7   < > 4.8 4.6 4.9 4.4 4.4 4.3  CL 102  101   < > 98 101 100 101 100 99  CO2 24  25   < > 24 24 24 26 27 28   GLUCOSE 165*  166*   < > 139* 144* 197* 132* 127* 167*  BUN 64*  62*   < > 67* 77* 76* 74* 81* 73*  CREATININE 1.37*  1.32*   < > 1.48* 1.84* 1.78* 1.45* 1.44* 1.45*  CALCIUM 9.6  9.4   < > 9.5 9.2 9.2 9.3 9.3 9.1  MG 2.1  --   --   --   --   --   --   --   PHOS 4.3  4.4  --  4.3 4.2 3.7 3.1 3.1  --    <  > = values in this interval not displayed.   Estimated Creatinine Clearance: 38.2 mL/min (A) (by C-G formula based on SCr of 1.45 mg/dL (H)). Liver & Pancreas: Recent Labs  Lab 01/02/21 0256 01/03/21 0300 01/04/21 0556 01/05/21 0511 01/06/21 0400 01/07/21 0252 01/08/21 0322 01/08/21 1021  AST 12* 11*  --   --   --   --   --  10*  ALT 11 11  --   --   --   --   --  8  ALKPHOS 48 53  --   --   --   --   --  62  BILITOT 1.6* 1.4*  --   --   --   --   --  1.5*  PROT 5.4* 5.6*  --   --   --   --   --  5.4*  ALBUMIN 2.3*  2.3* 2.3*   < > 2.1* 2.2* 2.2* 2.1* 2.3*   < > = values in this interval not displayed.   No results for input(s): LIPASE, AMYLASE in the last 168 hours. No results for input(s): AMMONIA in the last 168 hours. Diabetic: No results for input(s): HGBA1C in the last 72 hours. Recent Labs  Lab 01/07/21 1131 01/07/21 1537 01/07/21 2126 01/08/21 0758 01/08/21 1119  GLUCAP 202* 234*  164* 88 166*   Cardiac Enzymes: No results for input(s): CKTOTAL, CKMB, CKMBINDEX, TROPONINI in the last 168 hours. No results for input(s): PROBNP in the last 8760 hours. Coagulation Profile: No results for input(s): INR, PROTIME in the last 168 hours. Thyroid Function Tests: No results for input(s): TSH, T4TOTAL, FREET4, T3FREE, THYROIDAB in the last 72 hours. Lipid Profile: No results for input(s): CHOL, HDL, LDLCALC, TRIG, CHOLHDL, LDLDIRECT in the last 72 hours. Anemia Panel: Recent Labs    01/06/21 1437  FERRITIN 410*  TIBC 176*  IRON 21*   Urine analysis:    Component Value Date/Time   COLORURINE YELLOW 12/24/2020 1700   APPEARANCEUR CLEAR 12/24/2020 1700   LABSPEC 1.015 12/24/2020 1700   PHURINE 5.0 12/24/2020 1700   GLUCOSEU 50 (A) 12/24/2020 1700   HGBUR NEGATIVE 12/24/2020 1700   BILIRUBINUR NEGATIVE 12/24/2020 1700   KETONESUR NEGATIVE 12/24/2020 1700   PROTEINUR NEGATIVE 12/24/2020 1700   UROBILINOGEN 0.2 05/15/2013 0733   NITRITE NEGATIVE 12/24/2020 1700    LEUKOCYTESUR NEGATIVE 12/24/2020 1700   Sepsis Labs: Invalid input(s): PROCALCITONIN, [place]  Microbiology: No results found for this or any previous visit (from the past 240 hour(s)).  Radiology Studies: No results found.    Aspynn Clover T. [place]  If 7PM-7AM, please contact night-coverage [email or web address] 01/08/2021, 12:46 PM

## 2021-01-08 NOTE — Progress Notes (Signed)
Physical Therapy Treatment Patient Details Name: April [name] MRN: [id number] DOB: [birth year] Today's Date: 01/08/2021    History of Present Illness April [name] is a 65 y.o. 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 Admitted to the hospital started on antibiotics developed atrial fibrillation with RVR.  Had a successful TEE with [facility] cardioversion.  On the morning of 12/29/2020 patient developed acute abdominal pain had CT imaging that was concerning for free air within the belly and a perforated diverticulitis s/p Hartman's and intubation on 2/6. Extubated on 2/8.    PT Comments    The patient is now ion a hospital be conducive for mobilizing, attempting OOB. Patient requires max to total assistance to sit up and return to supine. Attempted standing x 3 without ability to power up, Using Rw and STEDY.Patient currently will require a lift out of bed.  continue PT for mobility and exercise.  Follow Up Recommendations  SNF     Equipment Recommendations  None recommended by PT    Recommendations for Other Services       Precautions / Restrictions Precautions Precautions: Fall Precaution Comments: hx of falling, hip surgery, LLQ colostomy Restrictions Weight Bearing Restrictions: No    Mobility  Bed Mobility Overal bed mobility: Needs Assistance Bed Mobility: Supine to Sit;Sit to Sidelying;Rolling Rolling: Mod assist;+2 for physical assistance   Supine to sit: Max assist;HOB elevated;+2 for safety/equipment   Sit to sidelying: Total assist;+2 for physical assistance;+2 for safety/equipment General bed mobility comments: Today patient max assist of 2 to  move to sitting on bed edge. for transfer out of bed, patient needing verbal cues to assist with movement. On return to bed patient positioned in sidelying - requiring total  assist of two.    Transfers Overall transfer level: Needs assistance Equipment used: Rolling walker (2 wheeled) Transfers: Sit to/from Stand           General transfer comment: Attempted sit to stand x 2 with RW elevating bed to assist but patient unable to assist with powering up. Attempted sit to stand x 2 with stedy - also with elevated bed height. Patient unable to provide any powering up with LEs. Patient returned to bed.  Ambulation/Gait                 Stairs             Wheelchair Mobility    Modified Rankin (Stroke Patients Only)       Balance Overall balance assessment: Needs assistance Sitting-balance support: Feet supported Sitting balance-Leahy Scale: Fair Sitting balance - Comments: limited sitting on bed edge, starting to lean                                    Cognition Arousal/Alertness: Awake/alert Behavior During Therapy: WFL for tasks assessed/performed Overall Cognitive Status: Within Functional Limits for tasks assessed                                 General Comments: ? acknowledging deficits and needs at DC. Stated" Home health can help me"< spouse works      Exercises Other Exercises Other Exercises: Shoulder Flexion Reps x 10 each arm with active assist, educating and encouragement to perform  leg exercises in bed including ankle pumps and heel slides. Patient needs assistance to lift knees. Therapist encouraged any and all movement/fidgeting of LEs.    General Comments        Pertinent Vitals/Pain Pain Assessment: Faces Faces Pain Scale: Hurts little more Pain Location: abdomen, pelvis Pain Descriptors / Indicators: Grimacing;Guarding Pain Intervention(s): Monitored during session;Patient requesting pain meds-RN notified    Home Living                      Prior Function            PT Goals (current goals can now be found in the care plan section) Acute Rehab PT Goals Patient  Stated Goal: To go home Progress towards PT goals: Progressing toward goals    Frequency    Min 2X/week      PT Plan Current plan remains appropriate;Discharge plan needs to be updated;Frequency needs to be updated    Co-evaluation PT/OT/SLP Co-Evaluation/Treatment: Yes Reason for Co-Treatment: For patient/therapist safety PT goals addressed during session: Mobility/safety with mobility OT goals addressed during session: ADL's and self-care      AM-PAC PT "6 Clicks" Mobility   Outcome Measure  Help needed turning from your back to your side while in a flat bed without using bedrails?: A Lot Help needed moving from lying on your back to sitting on the side of a flat bed without using bedrails?: A Lot Help needed moving to and from a bed to a chair (including a wheelchair)?: Total Help needed standing up from a chair using your arms (e.g., wheelchair or bedside chair)?: Total Help needed to walk in hospital room?: Total Help needed climbing 3-5 steps with a railing? : Total 6 Click Score: 8    End of Session Equipment Utilized During Treatment: Oxygen Activity Tolerance: Patient limited by fatigue;Patient limited by pain Patient left: in bed;with call bell/phone within reach;with bed alarm set Nurse Communication: Need for lift equipment;Mobility status;Patient requests pain meds PT Visit Diagnosis: Muscle weakness (generalized) (M62.81);Pain     Time: 1221-1251 PT Time Calculation (min) (ACUTE ONLY): 30 min  Charges:  $Therapeutic Activity: 8-22 mins                     Tresa Endo PT Acute Rehabilitation Services Pager [phone] Office [phone]   Claretha Cooper 01/08/2021, 2:09 PM

## 2021-01-08 NOTE — TOC Progression Note (Addendum)
Transition of Care [facility]) - Progression Note    Patient Details  Name: April [name] MRN: [id number] Date of Birth: 06/19/1956  [facility] CM/SW Contact  Leeroy Cha, RN Phone Number: 01/08/2021, 8:36 AM  Clinical Narrative:    extremely frail, and they had to use a Hoyer lift to get her up in the chair yesterday.  Ongoing body anasarca, she is only moving about 200 cc on incentive spirometry.  Midline incision shows some fat necrosis at the base.  Her ostomy bag is full of stool this AM.  Ongoing swelling with extremely thin, delicate skin.  PLAN: following for progression, may need snf replacement for pt and ot and rehab. Expected Discharge Plan: Home/Self Care Barriers to Discharge: Continued Medical Work up  Expected Discharge Plan and Services Expected Discharge Plan: Home/Self Care   Discharge Planning Services: CM Consult   Living arrangements for the past 2 months: Single Family Home                                       Social Determinants of Health (SDOH) Interventions    Readmission Risk Interventions Readmission Risk Prevention Plan 05/15/2020 04/19/2020 02/09/2020  Transportation Screening Complete Complete Complete  PCP or Specialist Appt within 3-5 Days - - -  Not Complete comments - - -  HRI or [place] or Home Care Consult comments - - -  Social Work Consult for [place] Planning/Counseling - - -  SW consult not completed comments - - -  Palliative Care Screening - - -  Medication Review ([place]) Referral to Pharmacy Complete Referral to Pharmacy  PCP or Specialist appointment within 3-5 days of discharge Complete Complete Complete  HRI or Home Care Consult Complete Complete Complete  SW Recovery Care/Counseling Consult Complete Complete -  Palliative Care Screening Not Applicable Not Applicable Complete  Skilled Nursing Facility Complete Complete Patient Refused  Some recent data might be  hidden

## 2021-01-08 NOTE — Progress Notes (Signed)
Occupational Therapy Treatment Patient Details Name: [name] MRN: [id number] DOB: 1956/08/13 Today's Date: 01/08/2021    History of present illness JUDEE [name] is a 65 y.o. 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 Admitted to the hospital started on antibiotics developed atrial fibrillation with RVR.  Had a successful TEE with [facility] cardioversion.  On the morning of 12/29/2020 patient developed acute abdominal pain had CT imaging that was concerning for free air within the belly and a perforated diverticulitis s/p Hartman's and intubation on 2/6. Extubated on 2/8.   OT comments  Treatment focused on promoting functional mobility, active movement of upper and lower extremities and activity tolerance. Patient required max assist and +2 assistance to transfer to edge of bed. Attempts with RW and stedy to stand with max x 2 failed due to patient not having any strength in legs to assist with power up. Patient returned to supine with total assist. Patient perform shoulder flexion reps with active assist, performed ankle pumps and educate on LE movement in bed to improve strength and ROM. Patient limited by pain in abdomen and overall decreased participation today. Continue to recommend short term rehab at discharge.    Follow Up Recommendations  SNF    Equipment Recommendations  None recommended by OT    Recommendations for Other Services      Precautions / Restrictions Precautions Precautions: Fall Precaution Comments: hx of falling, hip surgery, LLQ colostomy Restrictions Weight Bearing Restrictions: No       Mobility Bed Mobility Overal bed mobility: Needs Assistance Bed Mobility: Supine to Sit;Sit to Sidelying;Rolling Rolling: Mod assist;+2 for physical assistance   Supine to sit: Max assist;HOB elevated;+2 for  safety/equipment   Sit to sidelying: Total assist;+2 for physical assistance;+2 for safety/equipment General bed mobility comments: Today patient max assist for transfer out of bed, patient needing verbal cues to assist with movement. On return to bed patient positioned in sidelying - requiring total assist of two.  Transfers Overall transfer level: Needs assistance Equipment used: Rolling walker (2 wheeled) Transfers: Sit to/from Stand           General transfer comment: Attempted sit to stand x 2 with RW elevating bed to assist but patient unable to assist with powering up. Attempted sit to stand x 2 with stedy - also with elevated bed height. Patient unable to provide any powering up with LEs. Patient returned to bed.    Balance Overall balance assessment: Needs assistance Sitting-balance support: Feet supported Sitting balance-Leahy Scale: Fair                                     ADL either performed or assessed with clinical judgement   ADL                                               Vision Patient Visual Report: No change from baseline     Perception     Praxis      Cognition Arousal/Alertness: Awake/alert Behavior During Therapy: WFL for tasks assessed/performed Overall Cognitive Status: Within Functional Limits for tasks assessed  Exercises Other Exercises Other Exercises: Shoulder Flexion Reps x 10 each arm with active assist, educating and encouragement to perform leg exercises in bed including ankle pumps and heel slides. Patient needs assistance to lift knees. Therapist encouraged any and all movement/fidgeting of LEs.   Shoulder Instructions       General Comments      Pertinent Vitals/ Pain       Pain Assessment: Faces Faces Pain Scale: Hurts little more Pain Location: abdomen, pelvis Pain Descriptors / Indicators: Grimacing;Guarding Pain  Intervention(s): Limited activity within patient's tolerance;Monitored during session;Repositioned  Home Living                                          Prior Functioning/Environment              Frequency  Min 2X/week        Progress Toward Goals  OT Goals(current goals can now be found in the care plan section)  Progress towards OT goals: OT to reassess next treatment  Acute Rehab OT Goals Patient Stated Goal: To go home OT Goal Formulation: With patient Time For Goal Achievement: 01/16/21 Potential to Achieve Goals: [place] Discharge plan remains appropriate    Co-evaluation                 AM-PAC OT "6 Clicks" Daily Activity     Outcome Measure   Help from another person eating meals?: A Little Help from another person taking care of personal grooming?: A Little Help from another person toileting, which includes using toliet, bedpan, or urinal?: Total Help from another person bathing (including washing, rinsing, drying)?: A Lot Help from another person to put on and taking off regular upper body clothing?: A Lot Help from another person to put on and taking off regular lower body clothing?: Total 6 Click Score: 12    End of Session Equipment Utilized During Treatment: Rolling walker  OT Visit Diagnosis: Other abnormalities of gait and mobility (R26.89);Muscle weakness (generalized) (M62.81);History of falling (Z91.81);Pain   Activity Tolerance Patient limited by pain;Patient limited by lethargy   Patient Left in bed;with call bell/phone within reach;with family/visitor present   Nurse Communication Mobility status        Time: 4944-9675 OT Time Calculation (min): 30 min  Charges: OT General Charges $OT Visit: 1 Visit OT Treatments $Therapeutic Activity: 8-22 mins  Derl Barrow, OTR/L [place]  Office [phone] Pager: [place] 01/08/2021, 1:18 PM

## 2021-01-08 NOTE — Progress Notes (Signed)
10 Days Post-Op    QQ:[id number] pain  Subjective: She is still extremely frail, and they had to use a Hoyer lift to get her up in the chair yesterday.  Ongoing body anasarca, she is only moving about 200 cc on incentive spirometry.  Midline incision shows some fat necrosis at the base.  Her ostomy bag is full of stool this AM.  Ongoing swelling with extremely thin, delicate skin.  Objective: Vital signs in last 24 hours: Temp:  [97.3 F (36.3 C)-98.5 F (36.9 C)] 98.2 F (36.8 C) (02/15 2321) Pulse Rate:  [65-86] 72 (02/16 0700) Resp:  [13-24] 24 (02/16 0700) BP: (101-125)/(40-86) 125/54 (02/16 0600) SpO2:  [89 %-100 %] 97 % (02/16 0700) Last BM Date: 01/07/21 82 IV recorded, no other intake recorded Urine 350 recorded, no other output recorded Afebrile, VSS Creatinine is stable H/H stable  Intake/Output from previous day: 02/15 0701 - 02/16 0700 In: 86.2 [I.V.:86.2] Out: 350 [Urine:350] Intake/Output this shift: No intake/output data recorded.  General appearance: alert, cooperative, no distress and Frail she pretty much is dependent on people doing everything for her right now. Resp: Clear anterior. GI: Soft, sore,.  Midline incision has some fat necrosis at the base.  She has a fair amount of stool in her ostomy this AM.  Lab Results:  Recent Labs    01/07/21 0252 01/08/21 0322  WBC 10.1 8.4  HGB 7.5* 7.4*  HCT 24.1* 24.0*  PLT 106* 91*    BMET Recent Labs    01/07/21 0252 01/08/21 0322  NA 137 135  K 4.4 4.4  CL 101 100  CO2 26 27  GLUCOSE 132* 127*  BUN 74* 81*  CREATININE 1.45* 1.44*  CALCIUM 9.3 9.3   PT/INR No results for input(s): LABPROT, INR in the last 72 hours.  Recent Labs  Lab 01/02/21 0256 01/03/21 0300 01/04/21 0556 01/05/21 0511 01/06/21 0400 01/07/21 0252 01/08/21 0322  AST 12* 11*  --   --   --   --   --   ALT 11 11  --   --   --   --   --   ALKPHOS 48 53  --   --   --   --   --   BILITOT 1.6* 1.4*  --   --   --   --   --    PROT 5.4* 5.6*  --   --   --   --   --   ALBUMIN 2.3*  2.3* 2.3* 2.2* 2.1* 2.2* 2.2* 2.1*     Lipase     Component Value Date/Time   LIPASE 49 12/29/2020 0822     Medications: . (feeding supplement) PROSource Plus  30 mL Oral BID BM  . amLODipine  5 mg Oral Daily  . brimonidine  1 drop Both Eyes BID  . Chlorhexidine Gluconate Cloth  6 each Topical Daily  . cycloSPORINE  1 drop Both Eyes BID  . darbepoetin (ARANESP) injection - NON-DIALYSIS  100 mcg Subcutaneous Q Tue-1800  . dorzolamide  1 drop Both Eyes Daily  . feeding supplement (NEPRO CARB STEADY)  237 mL Oral BID BM  . furosemide  40 mg Oral BID  . insulin aspart  0-5 Units Subcutaneous QHS  . insulin aspart  0-9 Units Subcutaneous TID WC  . latanoprost  1 drop Both Eyes QHS  . mouth rinse  15 mL Mouth Rinse BID  . metoprolol tartrate  25 mg Oral BID  . mycophenolate  180 mg Oral BID  . pantoprazole  40 mg Oral Daily  . predniSONE  7.5 mg Oral Q breakfast  . tacrolimus  1 mg Oral BID  . valACYclovir  500 mg Oral BID  . cyanocobalamin  1,000 mcg Oral Daily   . sodium chloride Stopped (01/02/21 2107)  . ferric gluconate (FERRLECIT/NULECIT) Test Dose    . heparin 250 Units/hr (01/08/21 0502)    Assessment/Plan Polycystic disease with renal transplant 2013-Prograf/prednisone. Renalfollowing Elevated Cr CHF/uncertain etiology -Cards following Chronic COPD/history of tobacco use- On 2L PRN at home Pleuritic chest pain and SOB- VQ 1/25:Low probability pulmonary embolus. Age indeterminate, non occlusive deep vein thrombosis involving the right popliteal vein Hx SVT Atrial flutter with RVR- on eliquis, which is currently on hold. Likely reason for elevated INR of 1.8 yesterday and some oozing Hyponatremia - Per medicine - Large hematoma left arm - drained and dressed - cleaning up hematoma still present Anemia  - Hbg 8.1>>7.4>>8.2>>7.5>>7.4;  transfusion per Hospitalists and Renal Severe protein calorie  malnutrition  Perforated sigmoid diverticulitis Exploratory laparotomy with sigmoid colectomy/end colostomy, 12/29/2020 DR. Coralie Keens   FEN: full liquids>> soft diet/nutrition consult   ID: Zosyn 2/6-2/12 DVT: Heparin Follow-up: Dr. Ninfa Linden  Plan: Advance her diet.  Nutrition consult to help with protein CMP/prealbumin tomorrow.  She has PT and OT working with her.       LOS: 23 days    Allsion [place] 01/08/2021 Please see Amion

## 2021-01-09 DIAGNOSIS — K5732 Diverticulitis of large intestine without perforation or abscess without bleeding: Secondary | ICD-10-CM | POA: Diagnosis not present

## 2021-01-09 DIAGNOSIS — I5031 Acute diastolic (congestive) heart failure: Secondary | ICD-10-CM | POA: Diagnosis not present

## 2021-01-09 DIAGNOSIS — N179 Acute kidney failure, unspecified: Secondary | ICD-10-CM | POA: Diagnosis not present

## 2021-01-09 DIAGNOSIS — Z940 Kidney transplant status: Secondary | ICD-10-CM | POA: Diagnosis not present

## 2021-01-09 LAB — CBC
HCT: 24.1 % — ABNORMAL LOW (ref 36.0–46.0)
Hemoglobin: 7.4 g/dL — ABNORMAL LOW (ref 12.0–15.0)
MCH: 32.7 pg (ref 26.0–34.0)
MCHC: 30.7 g/dL (ref 30.0–36.0)
MCV: 106.6 fL — ABNORMAL HIGH (ref 80.0–100.0)
Platelets: 103 10*3/uL — ABNORMAL LOW (ref 150–400)
RBC: 2.26 MIL/uL — ABNORMAL LOW (ref 3.87–5.11)
RDW: 20.6 % — ABNORMAL HIGH (ref 11.5–15.5)
WBC: 6.8 10*3/uL (ref 4.0–10.5)
nRBC: 0 % (ref 0.0–0.2)

## 2021-01-09 LAB — RENAL FUNCTION PANEL
Albumin: 2.2 g/dL — ABNORMAL LOW (ref 3.5–5.0)
Anion gap: 9 (ref 5–15)
BUN: 76 mg/dL — ABNORMAL HIGH (ref 8–23)
CO2: 26 mmol/L (ref 22–32)
Calcium: 9 mg/dL (ref 8.9–10.3)
Chloride: 101 mmol/L (ref 98–111)
Creatinine, Ser: 1.42 mg/dL — ABNORMAL HIGH (ref 0.44–1.00)
GFR, Estimated: 42 mL/min — ABNORMAL LOW (ref >60)
Glucose, Bld: 138 mg/dL — ABNORMAL HIGH (ref 70–99)
Phosphorus: 3 mg/dL (ref 2.5–4.6)
Potassium: 4.3 mmol/L (ref 3.5–5.1)
Sodium: 136 mmol/L (ref 135–145)

## 2021-01-09 LAB — GLUCOSE, CAPILLARY
Glucose-Capillary: 106 mg/dL — ABNORMAL HIGH (ref 70–99)
Glucose-Capillary: 245 mg/dL — ABNORMAL HIGH (ref 70–99)
Glucose-Capillary: 312 mg/dL — ABNORMAL HIGH (ref 70–99)
Glucose-Capillary: 217 mg/dL — ABNORMAL HIGH (ref 70–99)

## 2021-01-09 LAB — PREPARE RBC (CROSSMATCH)

## 2021-01-09 LAB — PROTIME-INR
INR: 1.2 (ref 0.8–1.2)
Prothrombin Time: 14.9 seconds (ref 11.4–15.2)

## 2021-01-09 LAB — APTT: aPTT: 29 seconds (ref 24–36)

## 2021-01-09 LAB — MAGNESIUM: Magnesium: 1.7 mg/dL (ref 1.7–2.4)

## 2021-01-09 LAB — PREALBUMIN: Prealbumin: 10.3 mg/dL — ABNORMAL LOW (ref 18–38)

## 2021-01-09 MED ORDER — SODIUM CHLORIDE 0.9% IV SOLUTION: Freq: Once | INTRAVENOUS | Status: AC

## 2021-01-09 NOTE — Care Management Important Message (Signed)
Important Message  Patient Details IM Letter given to the Patient Name: April [name] MRN: [id number] Date of Birth: 20-Dec-1955   Medicare Important Message Given:  Yes     Kerin Salen 01/09/2021, 11:02 AM

## 2021-01-09 NOTE — Progress Notes (Addendum)
[facility] Progress Note  11 Days Post-Op  Subjective: CC-  Feels about the same as yesterday. Abdominal pain well controlled. She did not take any narcotics yesterday. Intermittent nausea at times, no emesis. Tolerating diet but not eating much. She only ate a few bites of an egg sandwich for dinner. She is on Nepro shakes but did not like them very much so she drank <1 yesterday. Ostomy functioning. WBC 6.8, afebrile. Hemoglobin stable at 7.4.  Objective: Vital signs in last 24 hours: Temp:  [97.5 F (36.4 C)-98.3 F (36.8 C)] 98.3 F (36.8 C) (02/17 0516) Pulse Rate:  [68-85] 70 (02/17 0516) Resp:  [16-18] 16 (02/17 0516) BP: (119-150)/(55-96) 130/55 (02/17 0516) SpO2:  [88 %-100 %] 100 % (02/17 0516) Weight:  [75.1 kg] 75.1 kg (02/17 0516) Last BM Date: 01/09/21  Intake/Output from previous day: 02/16 0701 - 02/17 0700 In: 1830.4 [P.O.:600; I.V.:30.4; NG/GT:1200] Out: 925 [Urine:600; Stool:325] Intake/Output this shift: Total I/O In: -  Out: 600 [Urine:600]  PE: Gen:  Alert, NAD, frail Pulm: rate and effort normal. On 2L supplemental O2 via [place] Abd: soft, nondistended, tender over incision, ostomy with brown stool in appliance, open midline wound with some visible sutures and fibrinous exudate at the base  Lab Results:  Recent Labs    01/08/21 0322 01/09/21 0511  WBC 8.4 6.8  HGB 7.4* 7.4*  HCT 24.0* 24.1*  PLT 91* 103*   BMET Recent Labs    01/08/21 1021 01/09/21 0511  NA 135 136  K 4.3 4.3  CL 99 101  CO2 28 26  GLUCOSE 167* 138*  BUN 73* 76*  CREATININE 1.45* 1.42*  CALCIUM 9.1 9.0   PT/INR No results for input(s): LABPROT, INR in the last 72 hours. CMP     Component Value Date/Time   NA 136 01/09/2021 0511   K 4.3 01/09/2021 0511   CL 101 01/09/2021 0511   CO2 26 01/09/2021 0511   GLUCOSE 138 (H) 01/09/2021 0511   BUN 76 (H) 01/09/2021 0511   CREATININE 1.42 (H) 01/09/2021 0511   CREATININE 1.28 (H) 11/04/2020 0952    CALCIUM 9.0 01/09/2021 0511   CALCIUM 10.8 (H) 10/13/2011 1405   PROT 5.4 (L) 01/08/2021 1021   ALBUMIN 2.2 (L) 01/09/2021 0511   AST 10 (L) 01/08/2021 1021   AST 13 (L) 11/04/2020 0952   ALT 8 01/08/2021 1021   ALT 12 11/04/2020 0952   ALKPHOS 62 01/08/2021 1021   BILITOT 1.5 (H) 01/08/2021 1021   BILITOT 0.6 11/04/2020 0952   GFRNONAA 42 (L) 01/09/2021 0511   GFRNONAA 47 (L) 11/04/2020 0952   GFRAA 51 (L) 08/05/2020 1523   Lipase     Component Value Date/Time   LIPASE 49 12/29/2020 0822       Studies/Results: No results found.  Anti-infectives: Anti-infectives (From admission, onward)   Start     Dose/Rate Route Frequency Ordered Stop   12/31/20 2200  valACYclovir (VALTREX) tablet 500 mg        500 mg Oral 2 times daily 12/31/20 2100     12/31/20 1000  valACYclovir (VALTREX) compounded oral suspension 500 mg  Status:  Discontinued        500 mg Per Tube 2 times daily 12/30/20 2051 12/31/20 2100   12/29/20 1045  piperacillin-tazobactam (ZOSYN) IVPB 3.375 g        3.375 g 12.5 mL/hr over 240 Minutes Intravenous Every 8 hours 12/29/20 0956 01/04/21 0152   12/24/20 0800  ciprofloxacin (CIPRO)  tablet 500 mg  Status:  Discontinued        500 mg Oral Daily with breakfast 12/23/20 1721 12/23/20 1737   12/23/20 2200  metroNIDAZOLE (FLAGYL) tablet 500 mg  Status:  Discontinued        500 mg Oral Every 8 hours 12/23/20 1721 12/23/20 1737   12/23/20 2200  amoxicillin-clavulanate (AUGMENTIN) 500-125 MG per tablet 500 mg  Status:  Discontinued        1 tablet Oral 2 times daily 12/23/20 1739 12/29/20 0949   12/18/20 1100  Ampicillin-Sulbactam (UNASYN) 3 g in sodium chloride 0.9 % 100 mL IVPB  Status:  Discontinued        3 g 200 mL/hr over 30 Minutes Intravenous Every 12 hours 12/18/20 0729 12/23/20 1721   12/17/20 1400  Ampicillin-Sulbactam (UNASYN) 3 g in sodium chloride 0.9 % 100 mL IVPB  Status:  Discontinued        3 g 200 mL/hr over 30 Minutes Intravenous Every 6 hours  12/17/20 1004 12/18/20 0729   12/16/20 2200  ciprofloxacin (CIPRO) IVPB 400 mg  Status:  Discontinued        400 mg 200 mL/hr over 60 Minutes Intravenous Every 12 hours 12/16/20 1451 12/16/20 1734   12/16/20 2200  piperacillin-tazobactam (ZOSYN) IVPB 3.375 g  Status:  Discontinued        3.375 g 12.5 mL/hr over 240 Minutes Intravenous Every 8 hours 12/16/20 1734 12/17/20 0949   12/16/20 1800  metroNIDAZOLE (FLAGYL) IVPB 500 mg  Status:  Discontinued        500 mg 100 mL/hr over 60 Minutes Intravenous Every 8 hours 12/16/20 1451 12/16/20 1734   12/16/20 1515  valACYclovir (VALTREX) tablet 500 mg  Status:  Discontinued        500 mg Oral 2 times daily 12/16/20 1505 12/30/20 2048   12/16/20 0915  ciprofloxacin (CIPRO) IVPB 400 mg        400 mg 200 mL/hr over 60 Minutes Intravenous  Once 12/16/20 0912 12/16/20 1104   12/16/20 0915  metroNIDAZOLE (FLAGYL) IVPB 500 mg        500 mg 100 mL/hr over 60 Minutes Intravenous  Once 12/16/20 0912 12/16/20 1104       Assessment/Plan Polycystic disease with renal transplant 2013-Prograf/prednisone. Renal signed off 2/16 Elevated Cr CHF/uncertain etiology -Cards following Chronic COPD/history of tobacco use- On 2L PRN at home Pleuritic chest pain and SOB- VQ 1/25:Low probability pulmonary embolus. Age indeterminate, non occlusive deep vein thrombosis involving the right popliteal vein Hx SVT Atrial flutter with RVR- on eliquis, which is currently on hold. Likely reason for elevated INR of 1.8 yesterday and some oozing Hyponatremia - Per medicine - Large hematoma left arm - drained and dressed - cleaning up hematoma still present Anemia  - Hbg 8.1>>7.4>>8.2>>7.5>>7.4>>7.4;  transfusion per Hospitalists and Renal Severe protein calorie malnutrition - prealbumin 10.3 (2/17). Dietician consult  Perforated sigmoid diverticulitis S/p Exploratory laparotomy with sigmoid colectomy/end colostomy, 12/29/2020 DR. Coralie Keens  - POD#11 -  WBC WNL, afebrile - TID wet to dry dressing changes to midline abdominal wound - ostomy functioning  - Tolerating soft diet although not taking in much. Appreciate dietician recs regarding supplements. Advance to renal/CM diet - Continue PT/OT, mobilize as able. Currently recommending SNF  FEN: renal/CM diet, supplements per RD ID: Zosyn 2/6-2/12 DVT: Heparin held yesterday per primary Follow-up: Dr. Ninfa Linden   LOS: 24 days    Wellington [place], [facility] 01/09/2021, 9:17 AM Please see  Amion for pager number during day hours 7:00am-4:30pm

## 2021-01-09 NOTE — Progress Notes (Signed)
Pharmacy - IV heparin  Assessment:    Please see note from Lavonia Drafts) Irven Easterly, PharmD earlier today for full details.  Briefly, 65 y.o. female on anticoagulation for chronic DVT and recent DCCV (12/25/20). PTA Eliquis was switched to heparin for ex lap to repair perforated sigmoid diverticulitis. Patient is overall very frail, with continued oozing at midline incision, and large L arm/hand hematoma.   Heparin held yesterday d/t blood travelling back up IV line. In addition, patient extremely frail and covered in bruises. Patient not stable for PO anticoag however, so left with only LMWH and heparin as options. RN concerned about safety of giving SQ LMWH. And question efficacy of heparin at such low rates.  Discussed with MD, plan to hold heparin overnight and re-assess in AM  Plan:   Continue to hold heparin  Re-evaluate anticoag plans tomorrow  Reuel Boom, PharmD, BCPS [phone] 01/09/2021, 2:06 PM

## 2021-01-09 NOTE — Progress Notes (Signed)
PROGRESS NOTE  April [name] SNK:[id number] DOB: [birth year]   PCP: [name], MD  Patient is from: Home  DOA: 12/16/2020 LOS: 24  Chief complaints: Abdominal pain and fever  Brief Narrative / Interim history: 65 year old F with PMH of severe persistent asthma, chronic hypoxic RF on 2 L as needed, ESRD s/p deceased renal transplant in Feb 25, 2012 on immunomodulators and followed at [place], diastolic CHF, MGUS and HTN admitted for uncomplicated sigmoid diverticulitis and new onset A. fib with RVR.  She underwent TEE with DCCV on 2/6 but developed acute abdominal pain.  CT showed perforated diverticulitis.  She underwent laparotomy with Hartman's procedure but required MV and vasopressors postop.  She also stabilized in ICU and transferred back to [facility] service point 01/12/2021.  Postop complicated by slow return of bowel functions, recurrent acute on chronic kidney disease, volume overload and acute blood loss anemia.  Cardiology recommended anticoagulation at least for 3 weeks post DCCV.  Nephrology and general surgery following.  Subjective: Seen and examined earlier this morning.  No major events overnight of this morning.  No complaints other than some abdominal pain.  She denies chest pain or dyspnea.  Denies nausea or vomiting.  Objective: Vitals:   01/09/21 1012 01/09/21 1207 01/09/21 1254 01/09/21 1325  BP: (!) 166/50 (!) 122/58 (!) 190/62 107/73  Pulse:  77 74 72  Resp: 15 16 17 20   Temp:  98.7 F (37.1 C) 98.5 F (36.9 C) 98.5 F (36.9 C)  TempSrc:  Oral Axillary Axillary  SpO2: 100% 100% 99% 100%  Weight:      Height:        Intake/Output Summary (Last 24 hours) at 01/09/2021 1522 Last data filed at 01/09/2021 1300 Gross per 24 hour  Intake 619.17 ml  Output 1500 ml  Net -880.83 ml   Filed Weights   12/29/20 0500 01/04/21 0500 01/09/21 0516  Weight: 77.2 kg 80.6 kg 75.1 kg    Examination:  GENERAL: No apparent distress.  Nontoxic. HEENT: MMM.  Vision and hearing  grossly intact.  NECK: Supple.  No apparent JVD.  RESP: 100% on 2 L.  No IWOB.  Fair aeration bilaterally. CVS:  RRR. Heart sounds normal.  ABD/GI/GU: BS+. Abd soft.  Dressing over laparotomy wound DCI.  Normal output from ostomy. MSK/EXT:  Moves extremities.  Edema and some extravasation in upper extremities.  Cold extremities SKIN: Dressing over laparotomy wound DCI.  Extravasation/stain over LUE dressing.  NEURO: Awake, alert and oriented appropriately.  No apparent focal neuro deficit. PSYCH: Calm. Normal affect.   Procedures:  2/6-exploratory laparotomy, sigmoid colectomy and end colostomy 2/6-TEE with DCCV for A. Fib 2/6-2/8-ETT 2/14-I&D of left arm hematoma by CCS  Microbiology summarized: 2/2-COVID-19 PCR nonreactive.  Assessment & Plan: Acute on chronic hypoxemic respiratory failure due to septic shock in the setting of perforated diverticulitis/peritonitis-seems to have resolved. -ETT 2/6-2/8. -Completed antibiotic course with IV Zosyn on 2/11. -OOB/PT/OT/incentive spirometry  Septic shock due to perforated acute sigmoid diverticulitis s/p ex lap, sigmoid colectomy & end colostomy -Completed antibiotic course with IV Zosyn. -Ostomy care per surgery and wound care  A flutter with RVR s/p TEE and DCCV on 12/29/2020  -Continue metoprolol 25 mg twice daily -Cardiology recommendations:  -Uninterrupted AC for 30 days post DCCV-holding IV heparin due to oozing at midline incision and extremity hematoma  -Elective ablation in the future.  -Outpatient follow-up with [facility] EP team   Acute blood loss anemia superimposed on anemia of renal disease-surgical blood loss and hematoma to  left arm Recent Labs    01/01/21 1204 01/02/21 0256 01/03/21 0720 01/04/21 0556 01/05/21 0511 01/05/21 1202 01/06/21 1018 01/07/21 0252 01/08/21 0322 01/09/21 0511  HGB 8.1* 8.9* 8.8* 8.1* 7.4* 8.2* 8.6* 7.5* 7.4* 7.4*  -Transfuse 1 unit -Continue p.o. ferric gluconate -Recheck H&H in the  morning  Acute on chronic diastolic CHF: TTE ordered 1/25 with LVEF of 70 to 75%, G2-DD.  Some evidence of fluid overload with bilateral lower extremity edema and extravasation, Rt>Lt. INR incomplete. -Continue oral Lasix 40 mg twice daily per nephrology -Monitor fluid status, renal functions and electrolytes  AKI on CKD-3A/azotemia in patient with Hx of ESRD s/p deceased donor transplant in Feb 24, 2012:  Cr seems to be stable at~1.4.  Followed at [facility]. Recent Labs    01/01/21 0457 01/02/21 0256 01/03/21 0300 01/04/21 0556 01/05/21 0511 01/06/21 0400 01/07/21 0252 01/08/21 0322 01/08/21 1021 01/09/21 0511  BUN 69* 64*  62* 62* 67* 77* 76* 74* 81* 73* 76*  CREATININE 1.43* 1.37*  1.32* 1.48* 1.48* 1.84* 1.78* 1.45* 1.44* 1.45* 1.42*  -Monitor H&H -Appreciate help by nephrology -Continue home Prograf, Myfortic and home prednisone  NIDDM-2 with renal disease and hyperglycemia Recent Labs  Lab 01/08/21 1119 01/08/21 1637 01/08/21 February 23, 2109 01/09/21 0741 01/09/21 1206  GLUCAP 166* 233* 172* 106* 245*  -Continue current insulin regimen  Hypernatremia/hyperkalemia: Resolved.  Adrenal insufficiency: Likely in the setting of acute illness -On home prednisone.  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 -TBI abnormal. -Continue low-dose amlodipine -She may be benefited from hand gloves.  Suspected OSA/chronic COPD -Nightly CPAP while in-house -Needs outpatient sleep study. -Outpatient follow-up with pulmonology/Dr. Vaughan Browner is her primary pulmonologist.   Age-indeterminate nonocclusive DVT of right PV-may not need treatment for this but Rx for DVT given immobility.  VQ scan low probability for PE. -Anticoagulation as above  Thrombocytopenia: Platelet 103.  Stable. -Continue monitoring  Debility/physical deconditioning in the setting of acute illness -PT/OT recommended SNF  Nutritional Status Body mass index is 31.28  kg/m. Nutrition Problem: Increased nutrient needs Etiology: acute illness Signs/Symptoms: estimated needs Interventions: Nepro shake,Prostat   Stage II sacral injury: Pressure Injury 12/29/20 Sacrum Medial Stage 2 -  Partial thickness loss of dermis presenting as a shallow open injury with a red, pink wound bed without slough. (Active)  12/29/20 1130  Location: Sacrum  Location Orientation: Medial  Staging: Stage 2 -  Partial thickness loss of dermis presenting as a shallow open injury with a red, pink wound bed without slough.  Wound Description (Comments):   Present on Admission: No   DVT prophylaxis:  Place and maintain sequential compression device Start: 12/31/20 1004  Code Status: Full code Family Communication: Patient and/or RN.  Patient's daughter, [name] did not answer phone call. Level of care: Telemetry Status is: Inpatient  Remains inpatient appropriate because:Unsafe d/c plan, IV treatments appropriate due to intensity of illness or inability to take PO and Inpatient level of care appropriate due to severity of illness   Dispo:  Patient From: Home  Planned Disposition: [place]  Expected discharge date: 01/13/2021  Medically stable for discharge: No         Consultants:  General surgery Nephrology PCCM-signed off Cardiology-signed off   Sch Meds:  Scheduled Meds: . (feeding supplement) PROSource Plus  30 mL Oral BID BM  . amLODipine  5 mg Oral Daily  . brimonidine  1 drop Both Eyes BID  . Chlorhexidine Gluconate Cloth  6 each  Topical Daily  . cycloSPORINE  1 drop Both Eyes BID  . darbepoetin (ARANESP) injection - NON-DIALYSIS  100 mcg Subcutaneous Q Tue-1800  . dorzolamide  1 drop Both Eyes Daily  . feeding supplement (NEPRO CARB STEADY)  237 mL Oral TID BM  . furosemide  40 mg Oral BID  . insulin aspart  0-5 Units Subcutaneous QHS  . insulin aspart  0-9 Units Subcutaneous TID WC  . latanoprost  1 drop Both Eyes QHS  . mouth  rinse  15 mL Mouth Rinse BID  . metoprolol tartrate  25 mg Oral BID  . mycophenolate  180 mg Oral BID  . pantoprazole  40 mg Oral Daily  . predniSONE  7.5 mg Oral Q breakfast  . tacrolimus  1 mg Oral BID  . valACYclovir  500 mg Oral BID  . cyanocobalamin  1,000 mcg Oral Daily   Continuous Infusions: . sodium chloride Stopped (01/09/21 1250)  . ferric gluconate (FERRLECIT/NULECIT) Test Dose     PRN Meds:.sodium chloride, acetaminophen, fentaNYL (SUBLIMAZE) injection, hydrALAZINE, lip balm, naLOXone (NARCAN)  injection, [DISCONTINUED] ondansetron **OR** ondansetron (ZOFRAN) IV, oxyCODONE  Antimicrobials: Anti-infectives (From admission, onward)   Start     Dose/Rate Route Frequency Ordered Stop   12/31/20 2200  valACYclovir (VALTREX) tablet 500 mg        500 mg Oral 2 times daily 12/31/20 2100     12/31/20 1000  valACYclovir (VALTREX) compounded oral suspension 500 mg  Status:  Discontinued        500 mg Per Tube 2 times daily 12/30/20 2051 12/31/20 2100   12/29/20 1045  piperacillin-tazobactam (ZOSYN) IVPB 3.375 g        3.375 g 12.5 mL/hr over 240 Minutes Intravenous Every 8 hours 12/29/20 0956 01/04/21 0152   12/24/20 0800  ciprofloxacin (CIPRO) tablet 500 mg  Status:  Discontinued        500 mg Oral Daily with breakfast 12/23/20 1721 12/23/20 1737   12/23/20 2200  metroNIDAZOLE (FLAGYL) tablet 500 mg  Status:  Discontinued        500 mg Oral Every 8 hours 12/23/20 1721 12/23/20 1737   12/23/20 2200  amoxicillin-clavulanate (AUGMENTIN) 500-125 MG per tablet 500 mg  Status:  Discontinued        1 tablet Oral 2 times daily 12/23/20 1739 12/29/20 0949   12/18/20 1100  Ampicillin-Sulbactam (UNASYN) 3 g in sodium chloride 0.9 % 100 mL IVPB  Status:  Discontinued        3 g 200 mL/hr over 30 Minutes Intravenous Every 12 hours 12/18/20 0729 12/23/20 1721   12/17/20 1400  Ampicillin-Sulbactam (UNASYN) 3 g in sodium chloride 0.9 % 100 mL IVPB  Status:  Discontinued        3 g 200 mL/hr  over 30 Minutes Intravenous Every 6 hours 12/17/20 1004 12/18/20 0729   12/16/20 2200  ciprofloxacin (CIPRO) IVPB 400 mg  Status:  Discontinued        400 mg 200 mL/hr over 60 Minutes Intravenous Every 12 hours 12/16/20 1451 12/16/20 1734   12/16/20 2200  piperacillin-tazobactam (ZOSYN) IVPB 3.375 g  Status:  Discontinued        3.375 g 12.5 mL/hr over 240 Minutes Intravenous Every 8 hours 12/16/20 1734 12/17/20 0949   12/16/20 1800  metroNIDAZOLE (FLAGYL) IVPB 500 mg  Status:  Discontinued        500 mg 100 mL/hr over 60 Minutes Intravenous Every 8 hours 12/16/20 1451 12/16/20 1734   12/16/20 1515  valACYclovir (VALTREX) tablet 500 mg  Status:  Discontinued        500 mg Oral 2 times daily 12/16/20 1505 12/30/20 2048   12/16/20 0915  ciprofloxacin (CIPRO) IVPB 400 mg        400 mg 200 mL/hr over 60 Minutes Intravenous  Once 12/16/20 0912 12/16/20 1104   12/16/20 0915  metroNIDAZOLE (FLAGYL) IVPB 500 mg        500 mg 100 mL/hr over 60 Minutes Intravenous  Once 12/16/20 0912 12/16/20 1104       I have personally reviewed the following labs and images: CBC: Recent Labs  Lab 01/05/21 1202 01/06/21 1018 01/07/21 0252 01/08/21 0322 01/09/21 0511  WBC 12.8* 12.7* 10.1 8.4 6.8  HGB 8.2* 8.6* 7.5* 7.4* 7.4*  HCT 27.6* 27.7* 24.1* 24.0* 24.1*  MCV 108.7* 104.1* 103.9* 104.8* 106.6*  PLT 120* 117* 106* 91* 103*   BMP &GFR Recent Labs  Lab 01/05/21 0511 01/06/21 0400 01/07/21 0252 01/08/21 0322 01/08/21 1021 01/09/21 0511  NA 134* 135 137 135 135 136  K 4.6 4.9 4.4 4.4 4.3 4.3  CL 101 100 101 100 99 101  CO2 24 24 26 27 28 26   GLUCOSE 144* 197* 132* 127* 167* 138*  BUN 77* 76* 74* 81* 73* 76*  CREATININE 1.84* 1.78* 1.45* 1.44* 1.45* 1.42*  CALCIUM 9.2 9.2 9.3 9.3 9.1 9.0  MG  --   --   --   --   --  1.7  PHOS 4.2 3.7 3.1 3.1  --  3.0   Estimated Creatinine Clearance: 37.6 mL/min (A) (by C-G formula based on SCr of 1.42 mg/dL (H)). Liver & Pancreas: Recent Labs  Lab  01/03/21 0300 01/04/21 0556 01/06/21 0400 01/07/21 0252 01/08/21 0322 01/08/21 1021 01/09/21 0511  AST 11*  --   --   --   --  10*  --   ALT 11  --   --   --   --  8  --   ALKPHOS 53  --   --   --   --  62  --   BILITOT 1.4*  --   --   --   --  1.5*  --   PROT 5.6*  --   --   --   --  5.4*  --   ALBUMIN 2.3*   < > 2.2* 2.2* 2.1* 2.3* 2.2*   < > = values in this interval not displayed.   No results for input(s): LIPASE, AMYLASE in the last 168 hours. No results for input(s): AMMONIA in the last 168 hours. Diabetic: No results for input(s): HGBA1C in the last 72 hours. Recent Labs  Lab 01/08/21 1119 01/08/21 1637 01/08/21 2110 01/09/21 0741 01/09/21 1206  GLUCAP 166* 233* 172* 106* 245*   Cardiac Enzymes: No results for input(s): CKTOTAL, CKMB, CKMBINDEX, TROPONINI in the last 168 hours. No results for input(s): PROBNP in the last 8760 hours. Coagulation Profile: Recent Labs  Lab 01/09/21 1113  INR 1.2   Thyroid Function Tests: No results for input(s): TSH, T4TOTAL, FREET4, T3FREE, THYROIDAB in the last 72 hours. Lipid Profile: No results for input(s): CHOL, HDL, LDLCALC, TRIG, CHOLHDL, LDLDIRECT in the last 72 hours. Anemia Panel: No results for input(s): VITAMINB12, FOLATE, FERRITIN, TIBC, IRON, RETICCTPCT in the last 72 hours. Urine analysis:    Component Value Date/Time   COLORURINE YELLOW 12/24/2020 1700   APPEARANCEUR CLEAR 12/24/2020 1700   LABSPEC 1.015 12/24/2020 1700   PHURINE 5.0 12/24/2020  1700   GLUCOSEU 50 (A) 12/24/2020 1700   HGBUR NEGATIVE 12/24/2020 1700   BILIRUBINUR NEGATIVE 12/24/2020 1700   KETONESUR NEGATIVE 12/24/2020 1700   PROTEINUR NEGATIVE 12/24/2020 1700   UROBILINOGEN 0.2 05/15/2013 0733   NITRITE NEGATIVE 12/24/2020 1700   LEUKOCYTESUR NEGATIVE 12/24/2020 1700   Sepsis Labs: Invalid input(s): PROCALCITONIN, [place]  Microbiology: No results found for this or any previous visit (from the past 240 hour(s)).  Radiology  Studies: No results found.    Monserratt Cogle T. McClure  If 7PM-7AM, please contact night-coverage [email or web address] 01/09/2021, 3:22 PM

## 2021-01-09 NOTE — Progress Notes (Signed)
Pt. placed on CPAP for h/s, added 2 lpm oxygen into circuit, RN made aware.

## 2021-01-09 NOTE — Plan of Care (Signed)
  Problem: Education: Goal: Knowledge of General Education information will improve Description: Including pain rating scale, medication(s)/side effects and non-pharmacologic comfort measures Outcome: Progressing   Problem: Pain Managment: Goal: General experience of comfort will improve Outcome: Progressing   

## 2021-01-09 NOTE — Progress Notes (Signed)
SLP Cancellation Note  Patient Details Name: ALEXIUS ELLINGTON MRN: [id number] DOB: May 05, 1956   DC treatment:       Reason Eval/Treat Not Completed: Other (comment) (SLP to sign off, pt tolerating po diet per chart review please reconsult if desired)  Kathleen Lime, MS [place] Office [phone] Pager [phone]   [name] 01/09/2021, 6:22 PM

## 2021-01-10 DIAGNOSIS — N179 Acute kidney failure, unspecified: Secondary | ICD-10-CM | POA: Diagnosis not present

## 2021-01-10 DIAGNOSIS — Z940 Kidney transplant status: Secondary | ICD-10-CM | POA: Diagnosis not present

## 2021-01-10 DIAGNOSIS — I5031 Acute diastolic (congestive) heart failure: Secondary | ICD-10-CM | POA: Diagnosis not present

## 2021-01-10 DIAGNOSIS — K5732 Diverticulitis of large intestine without perforation or abscess without bleeding: Secondary | ICD-10-CM | POA: Diagnosis not present

## 2021-01-10 LAB — BPAM RBC
Blood Product Expiration Date: 202203122359
ISSUE DATE / TIME: 202202171303
Unit Type and Rh: 600

## 2021-01-10 LAB — RENAL FUNCTION PANEL
Albumin: 2.2 g/dL — ABNORMAL LOW (ref 3.5–5.0)
Anion gap: 8 (ref 5–15)
BUN: 72 mg/dL — ABNORMAL HIGH (ref 8–23)
CO2: 26 mmol/L (ref 22–32)
Calcium: 9 mg/dL (ref 8.9–10.3)
Chloride: 102 mmol/L (ref 98–111)
Creatinine, Ser: 1.19 mg/dL — ABNORMAL HIGH (ref 0.44–1.00)
GFR, Estimated: 51 mL/min — ABNORMAL LOW (ref >60)
Glucose, Bld: 141 mg/dL — ABNORMAL HIGH (ref 70–99)
Phosphorus: 2.5 mg/dL (ref 2.5–4.6)
Potassium: 4.8 mmol/L (ref 3.5–5.1)
Sodium: 136 mmol/L (ref 135–145)

## 2021-01-10 LAB — TYPE AND SCREEN
ABO/RH(D): A NEG
Antibody Screen: NEGATIVE
Unit division: 0

## 2021-01-10 LAB — GLUCOSE, CAPILLARY
Glucose-Capillary: 94 mg/dL (ref 70–99)
Glucose-Capillary: 184 mg/dL — ABNORMAL HIGH (ref 70–99)
Glucose-Capillary: 227 mg/dL — ABNORMAL HIGH (ref 70–99)
Glucose-Capillary: 292 mg/dL — ABNORMAL HIGH (ref 70–99)

## 2021-01-10 LAB — CBC
HCT: 30 % — ABNORMAL LOW (ref 36.0–46.0)
Hemoglobin: 9.5 g/dL — ABNORMAL LOW (ref 12.0–15.0)
MCH: 32.3 pg (ref 26.0–34.0)
MCHC: 31.7 g/dL (ref 30.0–36.0)
MCV: 102 fL — ABNORMAL HIGH (ref 80.0–100.0)
Platelets: 91 10*3/uL — ABNORMAL LOW (ref 150–400)
RBC: 2.94 MIL/uL — ABNORMAL LOW (ref 3.87–5.11)
RDW: 20.2 % — ABNORMAL HIGH (ref 11.5–15.5)
WBC: 4.4 10*3/uL (ref 4.0–10.5)
nRBC: 0 % (ref 0.0–0.2)

## 2021-01-10 LAB — MAGNESIUM: Magnesium: 1.6 mg/dL — ABNORMAL LOW (ref 1.7–2.4)

## 2021-01-10 MED ORDER — DAKINS (1/4 STRENGTH) 0.125 % EX SOLN
Freq: Two times a day (BID) | CUTANEOUS | Status: AC
  Filled 2021-01-10 (×2): qty 473

## 2021-01-10 MED ORDER — SODIUM CHLORIDE 0.9 % IV SOLN
125.0000 mg | Freq: Once | INTRAVENOUS | Status: AC
  Administered 2021-01-10: 125 mg via INTRAVENOUS
  Filled 2021-01-10: qty 10

## 2021-01-10 MED ORDER — SODIUM CHLORIDE 0.9 % IV SOLN
25.0000 mg | Freq: Once | INTRAVENOUS | Status: AC
  Administered 2021-01-10: 25 mg via INTRAVENOUS
  Filled 2021-01-10: qty 2

## 2021-01-10 MED ORDER — HEPARIN (PORCINE) 25000 UT/250ML-% IV SOLN
250.0000 [IU]/h | INTRAVENOUS | Status: DC
  Administered 2021-01-10: 250 [IU]/h via INTRAVENOUS
  Filled 2021-01-10: qty 250

## 2021-01-10 MED ORDER — ENSURE ENLIVE PO LIQD
237.0000 mL | Freq: Two times a day (BID) | ORAL | Status: DC
  Administered 2021-01-10 – 2021-01-14 (×9): 237 mL via ORAL

## 2021-01-10 NOTE — Progress Notes (Addendum)
Nutrition Follow-up  DOCUMENTATION CODES:   Obesity unspecified  INTERVENTION:  - will d/c Nepro Shakes. - will order Ensure Enlive BID, each supplement provides 350 kcal and 20 grams of protein - will order Magic Cup with breakfast meals, each supplement provides 290 kcal and 9 grams of protein. - continue 30 ml Prosource Plus BID, each supplement provides 100 kcal and 15 grams protein.    NUTRITION DIAGNOSIS:   Increased nutrient needs related to acute illness as evidenced by estimated needs. -ongoing  GOAL:   Patient will meet greater than or equal to 90% of their needs -unmet at this time.   MONITOR:   PO intake,Supplement acceptance,Labs,Weight trends,Skin  ASSESSMENT:   65 year old female with medical history of severe persistent asthma, chronic hypoxemic respiratory failure on 2L [place], ESRD s/p deceased donor renal transplant in 2013 on immune suppression followed by [facility], CHF, MGUS, and HTN.  She was recently seen in the ED due to uncomplicated sigmoid diverticulitis. She returned to the ED with pain and fever, admitted and started on abx, and developed afib with RVR. She has undergone TEE and cardioversion this admission. The morning of 2/6 patient developed acute abdominal pain and CT showed concern for free air in the abdomen and perforated diverticulitis; underwent Hartman's procedure. She developed acute hypoxia 2/2 septic shock related to perforation and peritonitis.  Patient laying in bed with no family or visitors present. RN at bedside.  Patient reports eating <50% of breakfast this AM which consisted of grits, sausage, scrambled eggs, and coffee. She had taken a few sips out of a carton on Nepro Shake. She reports enjoying strawberry Ensure and feels she could drink more of that supplement consistently. Encouraged her to use supplement to take PO pills.   Patient has also been enjoying chocolate sugar-free pudding. She likes chocolate ice cream also. Noted CBG around  breakfast time is often <100 mg/dl so will order magic cup (ice cream) for meal tray then.   She continues to accept Prosource 100% of the time offered.   Patient denies any chewing or swallowing difficulty. She has been experiencing intermittent nausea which occurs due to movement or feeling rumbling in her stomach. This is associated with mild cramping. She is unsure if the taste and/or smell of food worsens nausea. No other contributing factors. Nausea is resolved by PRN zofran.   Weight is beginning to trend down. Moderate pitting edema to BUE and deep pitting edema to BLE.     Labs reviewed; CBGs: 94 and 184 mg/dl, BUN: 72 mg/dl, creatinine: 1.19 mg/dl, Mg: 1.6 mg/dl, GFR: 51 ml/min.  Medications reviewed; 40 mg oral lasix BID, sliding scale novolog, 40 mg oral protonix/day, 7.5 mg deltasone/day, 1000 mcg oral cyanocobalamin/day.    Diet Order:   Diet Order            Diet Carb Modified Fluid consistency: Thin; Room service appropriate? Yes  Diet effective now                 EDUCATION NEEDS:   Education needs have been addressed  Skin:  Skin Assessment: Skin Integrity Issues: Skin Integrity Issues:: Stage II,Other (Comment),Incisions Stage II: sacrum Incisions: abdomen (2/6) Other: MASD bilateral buttocks  Last BM:  2/18 (type 5 x1)  Height:   Ht Readings from Last 1 Encounters:  12/29/20 5\' 1"  (1.549 m)    Weight:   Wt Readings from Last 1 Encounters:  01/10/21 73.9 kg     Estimated Nutritional Needs:  Kcal:  1900-2100 Protein:  90-100 grams Fluid:  >1.9L       Jarome Matin, MS, RD, LDN, CNSC Inpatient Clinical Dietitian RD pager # available in AMION  After hours/weekend pager # available in [facility]

## 2021-01-10 NOTE — Consult Note (Addendum)
[place] Nurse ostomy follow up Pouch change performed; surgical PA at bedside to assess abd wound appearance during dressing change; refer to their notes for assessment and plan of care.   Stoma type/location: LLQ colostomy Stomal assessment/size: red and viable, above skin level, 1 1/2 inches Peristomal assessment: intact Output mod amt semiformed stool Ostomy pouching: 2pc. 2 3/4" pouch and barrier ring Education provided: Performed pouch change; pt did not watch or ask questions or participate.  She will need total assistance with pouching activities after discharge; progress notes indicate she will plan to discharge to a rehab setting.  Applied barrier ring and 2 piece pouching system.  Educational materials and 4 sets of barrier rings/wafers/pouches left in room for staff nurse use.  No family members have been present during any of the pouch change demonstrations. Enrolled patient in [place] Start Discharge program: Yes, previously [place] team will continue to follow for further sessions while in the hospital. Julien Girt MSN, RN, [place], [place], [place]

## 2021-01-10 NOTE — Progress Notes (Signed)
PROGRESS NOTE  April [name] RCV:[id number] DOB: 07-09-1956   PCP: [name], MD  Patient is from: Home  DOA: 12/16/2020 LOS: 107  Chief complaints: Abdominal pain and fever  Brief Narrative / Interim history: 65 year old F with PMH of severe persistent asthma, chronic hypoxic RF on 2 L as needed, ESRD s/p deceased renal transplant in Mar 04, 2012 on immunomodulators and followed at [facility], diastolic CHF, MGUS and HTN admitted for uncomplicated sigmoid diverticulitis and new onset A. fib with RVR.  She underwent TEE with DCCV on 2/6 but developed acute abdominal pain.  CT showed perforated diverticulitis.  She underwent laparotomy with Hartman's procedure but required MV and vasopressors postop.  She also stabilized in ICU and transferred back to [facility] service point 01/12/2021.  Postop complicated by slow return of bowel functions, recurrent acute on chronic kidney disease, volume overload and acute blood loss anemia.  Cardiology recommended anticoagulation at least for 3 weeks post DCCV.  Nephrology and general surgery following.  Subjective: Seen and examined earlier this morning.  No major events overnight or this morning.  Pain fairly controlled.  She rates her abdominal pain 2/10.  Had some nausea earlier but no emesis.  Denies chest pain or dyspnea.  Per RN, slight bleed during dressing change over her left arm but not more than expected.  Hemoglobin up to 9.5 this morning which is more than expected after 1 unit.  We had a lengthy discussion about risk and benefit of anticoagulation. She prefers to resume anticoagulation unless further bleed.   Objective: Vitals:   01/10/21 0549 01/10/21 0826 01/10/21 1154 01/10/21 1228  BP: (!) 108/58 (!) 140/56 119/66 (!) 128/57  Pulse: 72 75 77 71  Resp: 18  16 18   Temp: 98.2 F (36.8 C)  (!) 97.4 F (36.3 C) 98 F (36.7 C)  TempSrc: Oral  Oral Oral  SpO2: 100%  100% 100%  Weight: 73.9 kg     Height:        Intake/Output Summary (Last 24 hours)  at 01/10/2021 1308 Last data filed at 01/10/2021 1100 Gross per 24 hour  Intake 894.83 ml  Output 03/04/2099 ml  Net -1205.17 ml   Filed Weights   01/04/21 0500 01/09/21 0516 01/10/21 0549  Weight: 80.6 kg 75.1 kg 73.9 kg    Examination:  GENERAL: No apparent distress.  Nontoxic. HEENT: MMM.  Vision and hearing grossly intact.  NECK: Supple.  No apparent JVD.  RESP: On 2 L.  No IWOB.  Fair aeration bilaterally. CVS:  RRR. Heart sounds normal.  ABD/GI/GU: BS+. Abd soft, NTND.  Dressing over laparotomy wound DCI.  Normal output from ostomy. MSK/EXT:  Moves extremities.  Edema and some extravasation in upper extremities.  Cold extremities with slightly dark discoloration in her digits SKIN: Cold extremities mainly in here fingers.  Dressing over LUE and abdomen DCI.  No significant hematoma, bruising or ecchymosis. NEURO: Awake, alert and oriented appropriately.  No apparent focal neuro deficit. PSYCH: Calm. Normal affect.  Procedures:  2/6-exploratory laparotomy, sigmoid colectomy and end colostomy 2/6-TEE with DCCV for A. Fib 2/6-2/8-ETT 2/14-I&D of left arm hematoma by CCS  Microbiology summarized: 2/2-COVID-19 PCR nonreactive.  Assessment & Plan: Acute on chronic hypoxemic respiratory failure due to septic shock in the setting of perforated diverticulitis/peritonitis-seems to have resolved. -ETT 2/6-2/8. -Completed antibiotic course with IV Zosyn on 2/11. -OOB/PT/OT/incentive spirometry  Septic shock due to perforated acute sigmoid diverticulitis s/p ex lap, sigmoid colectomy & end colostomy -Completed antibiotic course with IV Zosyn. -Ostomy  care per surgery and wound care  A flutter with RVR s/p TEE and DCCV on 12/29/2020  -Continue metoprolol 25 mg twice daily -Cardiology recommendations:  -Uninterrupted AC for 30 days post DCCV-resume IV heparin today.  -Elective ablation in the future.  -Outpatient follow-up with [facility] EP team   ABLA superimposed on ACD-surgical blood  loss and hematoma to left arm.  Hgb up by 2 g after 1 unit which seems to be exaggerated.  It could also be due to some hemoconcentration from diuretics.  No significant active bleed now Recent Labs    01/02/21 0256 01/03/21 0720 01/04/21 0556 01/05/21 0511 01/05/21 1202 01/06/21 1018 01/07/21 0252 01/08/21 0322 01/09/21 0511 01/10/21 0438  HGB 8.9* 8.8* 8.1* 7.4* 8.2* 8.6* 7.5* 7.4* 7.4* 9.5*  -IV ferric gluconate if no reaction to test dose -Recheck CBC in the morning  Acute on chronic diastolic CHF: TTE ordered 1/25 with LVEF of 70 to 75%, G2-DD.  Some evidence of fluid overload with bilateral lower extremity edema and extravasation, Rt>Lt. She had about 2.6 L UOP/24 hours.  Creatinine improving. -Continue oral Lasix 40 mg twice daily per nephrology -Monitor fluid status, renal functions and electrolytes  AKI on CKD-3A/azotemia in patient with Hx of ESRD s/p deceased donor transplant in Feb 15, 2012:  Followed at [facility].  AKI resolving. Recent Labs    01/02/21 0256 01/03/21 0300 01/04/21 0556 01/05/21 0511 01/06/21 0400 01/07/21 0252 01/08/21 0322 01/08/21 1021 01/09/21 0511 01/10/21 0438  BUN 64*  62* 62* 67* 77* 76* 74* 81* 73* 76* 72*  CREATININE 1.37*  1.32* 1.48* 1.48* 1.84* 1.78* 1.45* 1.44* 1.45* 1.42* 1.19*  -Continue monitoring -Appreciate help by nephrology -Continue home Prograf, Myfortic and home prednisone  NIDDM-2 with renal disease and hyperglycemia Recent Labs  Lab 01/09/21 1206 01/09/21 1648 01/09/21 02-15-35 01/10/21 0748 01/10/21 1149  GLUCAP 245* 312* 217* 94 184*  -Continue sensitive scale SSI  Left upper arm wound after hematoma evacuation -Appreciate help by general surgery.  Hypernatremia/hyperkalemia: Resolved.  Adrenal insufficiency: Likely in the setting of acute illness -On home prednisone.  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 -TBI abnormal. -Continue low-dose  amlodipine -Encourage warm hand wears  Suspected OSA/chronic COPD -Nightly CPAP while in-house -Needs outpatient sleep study. -Outpatient follow-up with pulmonology/Dr. Vaughan Browner is her primary pulmonologist.   Age-indeterminate nonocclusive DVT of right PV-may not need treatment for this but Rx for DVT given immobility.  VQ scan low probability for PE. -Anticoagulation as above  Thrombocytopenia: Platelet 91.  Relatively stable. -Continue monitoring  Debility/physical deconditioning in the setting of acute illness -PT/OT recommended SNF  Nutritional Status Body mass index is 30.78 kg/m. Nutrition Problem: Increased nutrient needs Etiology: acute illness Signs/Symptoms: estimated needs Interventions: Nepro shake,Prostat   Stage II sacral injury: Pressure Injury 12/29/20 Sacrum Medial Stage 2 -  Partial thickness loss of dermis presenting as a shallow open injury with a red, pink wound bed without slough. (Active)  12/29/20 1130  Location: Sacrum  Location Orientation: Medial  Staging: Stage 2 -  Partial thickness loss of dermis presenting as a shallow open injury with a red, pink wound bed without slough.  Wound Description (Comments):   Present on Admission: No   DVT prophylaxis:  Place and maintain sequential compression device Start: 12/31/20 1004  Code Status: Full code Family Communication: Updated patient's daughter, [name] over the phone on 2/17. Level of care: Telemetry Status is: Inpatient  Remains inpatient appropriate because:Unsafe d/c plan, IV treatments  appropriate due to intensity of illness or inability to take PO and Inpatient level of care appropriate due to severity of illness   Dispo:  Patient From: Home  Planned Disposition: [place]  Expected discharge date: 01/13/2021  Medically stable for discharge: No         Consultants:  General surgery Nephrology PCCM-signed off Cardiology-signed off   Sch Meds:  Scheduled  Meds: . (feeding supplement) PROSource Plus  30 mL Oral BID BM  . amLODipine  5 mg Oral Daily  . brimonidine  1 drop Both Eyes BID  . Chlorhexidine Gluconate Cloth  6 each Topical Daily  . cycloSPORINE  1 drop Both Eyes BID  . darbepoetin (ARANESP) injection - NON-DIALYSIS  100 mcg Subcutaneous Q Tue-1800  . dorzolamide  1 drop Both Eyes Daily  . feeding supplement (NEPRO CARB STEADY)  237 mL Oral TID BM  . furosemide  40 mg Oral BID  . insulin aspart  0-5 Units Subcutaneous QHS  . insulin aspart  0-9 Units Subcutaneous TID WC  . latanoprost  1 drop Both Eyes QHS  . mouth rinse  15 mL Mouth Rinse BID  . metoprolol tartrate  25 mg Oral BID  . mycophenolate  180 mg Oral BID  . pantoprazole  40 mg Oral Daily  . predniSONE  7.5 mg Oral Q breakfast  . sodium hypochlorite   Irrigation BID  . tacrolimus  1 mg Oral BID  . valACYclovir  500 mg Oral BID  . cyanocobalamin  1,000 mcg Oral Daily   Continuous Infusions: . sodium chloride Stopped (01/09/21 1250)  . ferric gluconate (FERRLECIT/NULECIT) IV 25 mg (01/10/21 1229)  . ferric gluconate (FERRLECIT/NULECIT) Test Dose     PRN Meds:.sodium chloride, acetaminophen, fentaNYL (SUBLIMAZE) injection, hydrALAZINE, lip balm, naLOXone (NARCAN)  injection, [DISCONTINUED] ondansetron **OR** ondansetron (ZOFRAN) IV, oxyCODONE  Antimicrobials: Anti-infectives (From admission, onward)   Start     Dose/Rate Route Frequency Ordered Stop   12/31/20 2200  valACYclovir (VALTREX) tablet 500 mg        500 mg Oral 2 times daily 12/31/20 2100     12/31/20 1000  valACYclovir (VALTREX) compounded oral suspension 500 mg  Status:  Discontinued        500 mg Per Tube 2 times daily 12/30/20 2051 12/31/20 2100   12/29/20 1045  piperacillin-tazobactam (ZOSYN) IVPB 3.375 g        3.375 g 12.5 mL/hr over 240 Minutes Intravenous Every 8 hours 12/29/20 0956 01/04/21 0152   12/24/20 0800  ciprofloxacin (CIPRO) tablet 500 mg  Status:  Discontinued        500 mg Oral  Daily with breakfast 12/23/20 1721 12/23/20 1737   12/23/20 2200  metroNIDAZOLE (FLAGYL) tablet 500 mg  Status:  Discontinued        500 mg Oral Every 8 hours 12/23/20 1721 12/23/20 1737   12/23/20 2200  amoxicillin-clavulanate (AUGMENTIN) 500-125 MG per tablet 500 mg  Status:  Discontinued        1 tablet Oral 2 times daily 12/23/20 1739 12/29/20 0949   12/18/20 1100  Ampicillin-Sulbactam (UNASYN) 3 g in sodium chloride 0.9 % 100 mL IVPB  Status:  Discontinued        3 g 200 mL/hr over 30 Minutes Intravenous Every 12 hours 12/18/20 0729 12/23/20 1721   12/17/20 1400  Ampicillin-Sulbactam (UNASYN) 3 g in sodium chloride 0.9 % 100 mL IVPB  Status:  Discontinued        3 g 200 mL/hr  over 30 Minutes Intravenous Every 6 hours 12/17/20 1004 12/18/20 0729   12/16/20 2200  ciprofloxacin (CIPRO) IVPB 400 mg  Status:  Discontinued        400 mg 200 mL/hr over 60 Minutes Intravenous Every 12 hours 12/16/20 1451 12/16/20 1734   12/16/20 2200  piperacillin-tazobactam (ZOSYN) IVPB 3.375 g  Status:  Discontinued        3.375 g 12.5 mL/hr over 240 Minutes Intravenous Every 8 hours 12/16/20 1734 12/17/20 0949   12/16/20 1800  metroNIDAZOLE (FLAGYL) IVPB 500 mg  Status:  Discontinued        500 mg 100 mL/hr over 60 Minutes Intravenous Every 8 hours 12/16/20 1451 12/16/20 1734   12/16/20 1515  valACYclovir (VALTREX) tablet 500 mg  Status:  Discontinued        500 mg Oral 2 times daily 12/16/20 1505 12/30/20 2048   12/16/20 0915  ciprofloxacin (CIPRO) IVPB 400 mg        400 mg 200 mL/hr over 60 Minutes Intravenous  Once 12/16/20 0912 12/16/20 1104   12/16/20 0915  metroNIDAZOLE (FLAGYL) IVPB 500 mg        500 mg 100 mL/hr over 60 Minutes Intravenous  Once 12/16/20 0912 12/16/20 1104       I have personally reviewed the following labs and images: CBC: Recent Labs  Lab 01/06/21 1018 01/07/21 0252 01/08/21 0322 01/09/21 0511 01/10/21 0438  WBC 12.7* 10.1 8.4 6.8 4.4  HGB 8.6* 7.5* 7.4* 7.4* 9.5*   HCT 27.7* 24.1* 24.0* 24.1* 30.0*  MCV 104.1* 103.9* 104.8* 106.6* 102.0*  PLT 117* 106* 91* 103* 91*   BMP &GFR Recent Labs  Lab 01/06/21 0400 01/07/21 0252 01/08/21 0322 01/08/21 1021 01/09/21 0511 01/10/21 0438  NA 135 137 135 135 136 136  K 4.9 4.4 4.4 4.3 4.3 4.8  CL 100 101 100 99 101 102  CO2 24 26 27 28 26 26   GLUCOSE 197* 132* 127* 167* 138* 141*  BUN 76* 74* 81* 73* 76* 72*  CREATININE 1.78* 1.45* 1.44* 1.45* 1.42* 1.19*  CALCIUM 9.2 9.3 9.3 9.1 9.0 9.0  MG  --   --   --   --  1.7 1.6*  PHOS 3.7 3.1 3.1  --  3.0 2.5   Estimated Creatinine Clearance: 44.5 mL/min (A) (by C-G formula based on SCr of 1.19 mg/dL (H)). Liver & Pancreas: Recent Labs  Lab 01/07/21 0252 01/08/21 0322 01/08/21 1021 01/09/21 0511 01/10/21 0438  AST  --   --  10*  --   --   ALT  --   --  8  --   --   ALKPHOS  --   --  62  --   --   BILITOT  --   --  1.5*  --   --   PROT  --   --  5.4*  --   --   ALBUMIN 2.2* 2.1* 2.3* 2.2* 2.2*   No results for input(s): LIPASE, AMYLASE in the last 168 hours. No results for input(s): AMMONIA in the last 168 hours. Diabetic: No results for input(s): HGBA1C in the last 72 hours. Recent Labs  Lab 01/09/21 1206 01/09/21 1648 01/09/21 2136 01/10/21 0748 01/10/21 1149  GLUCAP 245* 312* 217* 94 184*   Cardiac Enzymes: No results for input(s): CKTOTAL, CKMB, CKMBINDEX, TROPONINI in the last 168 hours. No results for input(s): PROBNP in the last 8760 hours. Coagulation Profile: Recent Labs  Lab 01/09/21 1113  INR 1.2  Thyroid Function Tests: No results for input(s): TSH, T4TOTAL, FREET4, T3FREE, THYROIDAB in the last 72 hours. Lipid Profile: No results for input(s): CHOL, HDL, LDLCALC, TRIG, CHOLHDL, LDLDIRECT in the last 72 hours. Anemia Panel: No results for input(s): VITAMINB12, FOLATE, FERRITIN, TIBC, IRON, RETICCTPCT in the last 72 hours. Urine analysis:    Component Value Date/Time   COLORURINE YELLOW 12/24/2020 1700    APPEARANCEUR CLEAR 12/24/2020 1700   LABSPEC 1.015 12/24/2020 1700   PHURINE 5.0 12/24/2020 1700   GLUCOSEU 50 (A) 12/24/2020 1700   HGBUR NEGATIVE 12/24/2020 1700   BILIRUBINUR NEGATIVE 12/24/2020 1700   KETONESUR NEGATIVE 12/24/2020 1700   PROTEINUR NEGATIVE 12/24/2020 1700   UROBILINOGEN 0.2 05/15/2013 0733   NITRITE NEGATIVE 12/24/2020 1700   LEUKOCYTESUR NEGATIVE 12/24/2020 1700   Sepsis Labs: Invalid input(s): PROCALCITONIN, [place]  Microbiology: No results found for this or any previous visit (from the past 240 hour(s)).  Radiology Studies: No results found.    Taye T. [place]  If 7PM-7AM, please contact night-coverage [email or web address] 01/10/2021, 1:08 PM

## 2021-01-10 NOTE — Progress Notes (Signed)
[facility] Progress Note  12 Days Post-Op  Subjective: CC-  Abdomen sore but pain well controlled. She only took oxy once yesterday. Some nausea at times, no emesis. Tolerating diet but not eating a lot. Daughter brought [employer] yesterday and she ate 1/2 (chicken nuggets and macaroni). She drank protein shake x2.  Objective: Vital signs in last 24 hours: Temp:  [97.6 F (36.4 C)-98.7 F (37.1 C)] 98.2 F (36.8 C) (02/18 0549) Pulse Rate:  [71-77] 75 (02/18 0826) Resp:  [15-20] 18 (02/18 0549) BP: (107-190)/(50-73) 140/56 (02/18 0826) SpO2:  [99 %-100 %] 100 % (02/18 0549) Weight:  [73.9 kg] 73.9 kg (02/18 0549) Last BM Date: 01/09/21  Intake/Output from previous day: 02/17 0701 - 02/18 0700 In: 1274 [P.O.:840; I.V.:80; Blood:354] Out: 2950 [Urine:2600; Stool:350] Intake/Output this shift: No intake/output data recorded.  PE: Gen:  Alert, NAD, frail Pulm: rate and effort normal. On 2L supplemental O2 via [place] Abd: soft, nondistended, tender over incision otherwise nontender, ostomy viable with new pouch in place, open midline wound pictured below with some visible sutures and fibrinous exudate at the base - green drainage noted on dressing today which is new from yesterday     Lab Results:  Recent Labs    01/09/21 0511 01/10/21 0438  WBC 6.8 4.4  HGB 7.4* 9.5*  HCT 24.1* 30.0*  PLT 103* 91*   BMET Recent Labs    01/09/21 0511 01/10/21 0438  NA 136 136  K 4.3 4.8  CL 101 102  CO2 26 26  GLUCOSE 138* 141*  BUN 76* 72*  CREATININE 1.42* 1.19*  CALCIUM 9.0 9.0   PT/INR Recent Labs    01/09/21 1113  LABPROT 14.9  INR 1.2   CMP     Component Value Date/Time   NA 136 01/10/2021 0438   K 4.8 01/10/2021 0438   CL 102 01/10/2021 0438   CO2 26 01/10/2021 0438   GLUCOSE 141 (H) 01/10/2021 0438   BUN 72 (H) 01/10/2021 0438   CREATININE 1.19 (H) 01/10/2021 0438   CREATININE 1.28 (H) 11/04/2020 0952   CALCIUM 9.0 01/10/2021 0438   CALCIUM  10.8 (H) 10/13/2011 1405   PROT 5.4 (L) 01/08/2021 1021   ALBUMIN 2.2 (L) 01/10/2021 0438   AST 10 (L) 01/08/2021 1021   AST 13 (L) 11/04/2020 0952   ALT 8 01/08/2021 1021   ALT 12 11/04/2020 0952   ALKPHOS 62 01/08/2021 1021   BILITOT 1.5 (H) 01/08/2021 1021   BILITOT 0.6 11/04/2020 0952   GFRNONAA 51 (L) 01/10/2021 0438   GFRNONAA 47 (L) 11/04/2020 0952   GFRAA 51 (L) 08/05/2020 1523   Lipase     Component Value Date/Time   LIPASE 49 12/29/2020 0822       Studies/Results: No results found.  Anti-infectives: Anti-infectives (From admission, onward)   Start     Dose/Rate Route Frequency Ordered Stop   12/31/20 2200  valACYclovir (VALTREX) tablet 500 mg        500 mg Oral 2 times daily 12/31/20 2100     12/31/20 1000  valACYclovir (VALTREX) compounded oral suspension 500 mg  Status:  Discontinued        500 mg Per Tube 2 times daily 12/30/20 2051 12/31/20 2100   12/29/20 1045  piperacillin-tazobactam (ZOSYN) IVPB 3.375 g        3.375 g 12.5 mL/hr over 240 Minutes Intravenous Every 8 hours 12/29/20 0956 01/04/21 0152   12/24/20 0800  ciprofloxacin (CIPRO) tablet 500 mg  Status:  Discontinued        500 mg Oral Daily with breakfast 12/23/20 1721 12/23/20 1737   12/23/20 2200  metroNIDAZOLE (FLAGYL) tablet 500 mg  Status:  Discontinued        500 mg Oral Every 8 hours 12/23/20 1721 12/23/20 1737   12/23/20 2200  amoxicillin-clavulanate (AUGMENTIN) 500-125 MG per tablet 500 mg  Status:  Discontinued        1 tablet Oral 2 times daily 12/23/20 1739 12/29/20 0949   12/18/20 1100  Ampicillin-Sulbactam (UNASYN) 3 g in sodium chloride 0.9 % 100 mL IVPB  Status:  Discontinued        3 g 200 mL/hr over 30 Minutes Intravenous Every 12 hours 12/18/20 0729 12/23/20 1721   12/17/20 1400  Ampicillin-Sulbactam (UNASYN) 3 g in sodium chloride 0.9 % 100 mL IVPB  Status:  Discontinued        3 g 200 mL/hr over 30 Minutes Intravenous Every 6 hours 12/17/20 1004 12/18/20 0729   12/16/20 2200   ciprofloxacin (CIPRO) IVPB 400 mg  Status:  Discontinued        400 mg 200 mL/hr over 60 Minutes Intravenous Every 12 hours 12/16/20 1451 12/16/20 1734   12/16/20 2200  piperacillin-tazobactam (ZOSYN) IVPB 3.375 g  Status:  Discontinued        3.375 g 12.5 mL/hr over 240 Minutes Intravenous Every 8 hours 12/16/20 1734 12/17/20 0949   12/16/20 1800  metroNIDAZOLE (FLAGYL) IVPB 500 mg  Status:  Discontinued        500 mg 100 mL/hr over 60 Minutes Intravenous Every 8 hours 12/16/20 1451 12/16/20 1734   12/16/20 1515  valACYclovir (VALTREX) tablet 500 mg  Status:  Discontinued        500 mg Oral 2 times daily 12/16/20 1505 12/30/20 2048   12/16/20 0915  ciprofloxacin (CIPRO) IVPB 400 mg        400 mg 200 mL/hr over 60 Minutes Intravenous  Once 12/16/20 0912 12/16/20 1104   12/16/20 0915  metroNIDAZOLE (FLAGYL) IVPB 500 mg        500 mg 100 mL/hr over 60 Minutes Intravenous  Once 12/16/20 0912 12/16/20 1104       Assessment/Plan Polycystic disease with renal transplant 2013-Prograf/prednisone. Renal signed off 2/16 Elevated Cr CHF/uncertain etiology -Cards following Chronic COPD/history of tobacco use- On 2L PRN at home Pleuritic chest pain and SOB- VQ 1/25:Low probability pulmonary embolus. Age indeterminate, non occlusive deep vein thrombosis involving the right popliteal vein Hx SVT Atrial flutter with RVR- on eliquis, which is currently on hold Large hematoma left arm - drained and dressed- cleaning up hematoma still present Anemia - Hbg 8.1>>7.4>>8.2>>7.5>>7.4>>7.4>>9.5; transfusion per Hospitalists and Renal Severe protein calorie malnutrition - prealbumin 10.3 (2/17). Dietician consult - Per medicine -  Perforated sigmoid diverticulitis -S/p Exploratory laparotomy with sigmoid colectomy/end colostomy, 12/29/2020 Dr. Coralie Keens  - POD#12 - WBC WNL, afebrile - ostomy functioning. WOC following for new ostomy teaching  - Add Dakins to abdominal wound wet to  dry dressing change x3 days for pseudomonas (to start 2/18 during next dressing change), change BID - Advance to CM diet. Encourage PO intake. Continue protein supplements.  - Continue PT/OT, mobilize as able. Currently recommending SNF  FEN:CM diet, supplements per RD ID: [email or web address] 2/6-2/12 DVT: Heparin held per primary - patient is high risk for complications therefore would favor waiting to start PO anticoagulation until she is closer to discharge Follow-up: Dr. Ninfa Linden    LOS: 25 days  Wellington Hampshire, [facility] 01/10/2021, 8:40 AM Please see Amion for pager number during day hours 7:00am-4:30pm

## 2021-01-10 NOTE — Progress Notes (Signed)
ANTICOAGULATION CONSULT NOTE - Follow Up Consult  Pharmacy Consult for Heparin Indication: hx atrial fibrillation  Allergies  Allergen Reactions  . Infed [Iron Dextran] Other (See Comments)    Chest tightness  . Pentamidine Itching, Shortness Of Breath and Swelling  . Budesonide-Formoterol Fumarate     Other reaction(s): hoarseness  . Bupropion     Other reaction(s): exacerbated depression  . Crestor [Rosuvastatin]     Other reaction(s): body aches, swelling  . Duloxetine Hcl     Other reaction(s): GI SE, weepy, worsening fatigue, foggy  . Erythromycin [Erythromycin] Other (See Comments)    Mouth Ulcers  . Iohexol Other (See Comments)    Per patient "she has had a kidney transplant and should never have contrast"  . Oxycodone Nausea Only and Nausea And Vomiting  . Pravastatin Sodium     Other reaction(s): myalgias  . Erythromycin Rash    Causes breakout in mouth  . Ultram [Tramadol Hcl] Anxiety    Patient Measurements: Height: 5\' 1"  (154.9 cm) Weight: 73.9 kg (162 lb 14.7 oz) IBW/kg (Calculated) : 47.8 Heparin Dosing Weight: 65 kg  Vital Signs: Temp: 98 F (36.7 C) (02/18 1228) Temp Source: Oral (02/18 1228) BP: 128/57 (02/18 1228) Pulse Rate: 71 (02/18 1228)  Labs: Recent Labs    01/08/21 0322 01/08/21 1021 01/09/21 0511 01/09/21 1113 01/10/21 0438  HGB 7.4*  --  7.4*  --  9.5*  HCT 24.0*  --  24.1*  --  30.0*  PLT 91*  --  103*  --  91*  APTT  --   --   --  29  --   LABPROT  --   --   --  14.9  --   INR  --   --   --  1.2  --   HEPARINUNFRC 0.29*  --   --   --   --   CREATININE 1.44* 1.45* 1.42*  --  1.19*    Estimated Creatinine Clearance: 44.5 mL/min (A) (by C-G formula based on SCr of 1.19 mg/dL (H)).   Medications:  Scheduled:  . (feeding supplement) PROSource Plus  30 mL Oral BID BM  . amLODipine  5 mg Oral Daily  . brimonidine  1 drop Both Eyes BID  . Chlorhexidine Gluconate Cloth  6 each Topical Daily  . cycloSPORINE  1 drop Both Eyes BID   . darbepoetin (ARANESP) injection - NON-DIALYSIS  100 mcg Subcutaneous Q Tue-1800  . dorzolamide  1 drop Both Eyes Daily  . feeding supplement (NEPRO CARB STEADY)  237 mL Oral TID BM  . furosemide  40 mg Oral BID  . insulin aspart  0-5 Units Subcutaneous QHS  . insulin aspart  0-9 Units Subcutaneous TID WC  . latanoprost  1 drop Both Eyes QHS  . mouth rinse  15 mL Mouth Rinse BID  . metoprolol tartrate  25 mg Oral BID  . mycophenolate  180 mg Oral BID  . pantoprazole  40 mg Oral Daily  . predniSONE  7.5 mg Oral Q breakfast  . sodium hypochlorite   Irrigation BID  . tacrolimus  1 mg Oral BID  . valACYclovir  500 mg Oral BID  . cyanocobalamin  1,000 mcg Oral Daily   Infusions:  . sodium chloride Stopped (01/09/21 1250)  . ferric gluconate (FERRLECIT/NULECIT) IV 25 mg (01/10/21 1229)  . ferric gluconate (FERRLECIT/NULECIT) Test Dose      Assessment: 65 yo female on apixaban PTA and last inpatient dose of apixaban was  on 2/5, s/p TEE/cardioversion on 12/26/2020. Presented with perforated acute sigmoid diverticulitis s/p Hartman's on 2/6 has had oozing from wound site and anticoagulation has not been restarted as a result until 2/11 when [facility] stated could be restarted if Hgb remained stable (8.9 > 8.8). Md ordered to start IV heparin per Rx, will not give bolus due to oozing and s/p multiple blood transfusions the last few days.    - Per Cards on 2/14, If no bleeding the next 24 hours recommend to restart Eliquis as recommended by surgery. Would prefer she had 4 weeks of uninterrupted anticoagulation after cardioversion.   - 2/17: heparin drip stopped d/t oozing at midline incision and extremity hematoma - 2/18: per Dr. Cyndia Skeeters, ok to resume heparin drip today after iron IV dose  Today, 01/10/2021: - Per pt's RN,  incision site looks good today - hgb up 9.5 (s/p 1 unit PRBC on 2/17), plts 91K - scr trending down 1.19 (crcl~44)   Goal of Therapy:  Heparin level 0.3-0.5 units/ml Monitor  platelets by anticoagulation protocol: Yes   Plan:  - resume heparin drip at 250 units/hr at 4p - check 6 hr heparin level - monitor closely for bleeding  Dia Sitter, PharmD, BCPS 01/10/2021 1:37 PM

## 2021-01-11 DIAGNOSIS — N179 Acute kidney failure, unspecified: Secondary | ICD-10-CM | POA: Diagnosis not present

## 2021-01-11 DIAGNOSIS — I5031 Acute diastolic (congestive) heart failure: Secondary | ICD-10-CM | POA: Diagnosis not present

## 2021-01-11 DIAGNOSIS — K5732 Diverticulitis of large intestine without perforation or abscess without bleeding: Secondary | ICD-10-CM | POA: Diagnosis not present

## 2021-01-11 DIAGNOSIS — Z940 Kidney transplant status: Secondary | ICD-10-CM | POA: Diagnosis not present

## 2021-01-11 LAB — RENAL FUNCTION PANEL
Albumin: 2.1 g/dL — ABNORMAL LOW (ref 3.5–5.0)
Anion gap: 8 (ref 5–15)
BUN: 64 mg/dL — ABNORMAL HIGH (ref 8–23)
CO2: 28 mmol/L (ref 22–32)
Calcium: 8.9 mg/dL (ref 8.9–10.3)
Chloride: 98 mmol/L (ref 98–111)
Creatinine, Ser: 1.2 mg/dL — ABNORMAL HIGH (ref 0.44–1.00)
GFR, Estimated: 51 mL/min — ABNORMAL LOW (ref >60)
Glucose, Bld: 251 mg/dL — ABNORMAL HIGH (ref 70–99)
Phosphorus: 2.1 mg/dL — ABNORMAL LOW (ref 2.5–4.6)
Potassium: 4.2 mmol/L (ref 3.5–5.1)
Sodium: 134 mmol/L — ABNORMAL LOW (ref 135–145)

## 2021-01-11 LAB — GLUCOSE, CAPILLARY
Glucose-Capillary: 170 mg/dL — ABNORMAL HIGH (ref 70–99)
Glucose-Capillary: 201 mg/dL — ABNORMAL HIGH (ref 70–99)
Glucose-Capillary: 244 mg/dL — ABNORMAL HIGH (ref 70–99)

## 2021-01-11 LAB — CBC
HCT: 28.1 % — ABNORMAL LOW (ref 36.0–46.0)
Hemoglobin: 8.9 g/dL — ABNORMAL LOW (ref 12.0–15.0)
MCH: 32.7 pg (ref 26.0–34.0)
MCHC: 31.7 g/dL (ref 30.0–36.0)
MCV: 103.3 fL — ABNORMAL HIGH (ref 80.0–100.0)
Platelets: 122 10*3/uL — ABNORMAL LOW (ref 150–400)
RBC: 2.72 MIL/uL — ABNORMAL LOW (ref 3.87–5.11)
RDW: 20.6 % — ABNORMAL HIGH (ref 11.5–15.5)
WBC: 3.1 10*3/uL — ABNORMAL LOW (ref 4.0–10.5)
nRBC: 0 % (ref 0.0–0.2)

## 2021-01-11 LAB — HEPARIN LEVEL (UNFRACTIONATED)
Heparin Unfractionated: <0.1 IU/mL — ABNORMAL LOW (ref 0.30–0.70)
Heparin Unfractionated: <0.1 IU/mL — ABNORMAL LOW (ref 0.30–0.70)
Heparin Unfractionated: <0.1 IU/mL — ABNORMAL LOW (ref 0.30–0.70)

## 2021-01-11 LAB — MAGNESIUM: Magnesium: 1.5 mg/dL — ABNORMAL LOW (ref 1.7–2.4)

## 2021-01-11 MED ORDER — MAGNESIUM SULFATE 2 GM/50ML IV SOLN
2.0000 g | Freq: Once | INTRAVENOUS | Status: AC
  Administered 2021-01-11: 2 g via INTRAVENOUS
  Filled 2021-01-11: qty 50

## 2021-01-11 MED ORDER — HEPARIN (PORCINE) 25000 UT/250ML-% IV SOLN
1650.0000 [IU]/h | INTRAVENOUS | Status: AC
  Administered 2021-01-11: 03:00:00 450 [IU]/h via INTRAVENOUS
  Administered 2021-01-13: 1650 [IU]/h via INTRAVENOUS
  Filled 2021-01-11 (×3): qty 250

## 2021-01-11 MED ORDER — INSULIN GLARGINE 100 UNIT/ML ~~LOC~~ SOLN
5.0000 [IU] | Freq: Every day | SUBCUTANEOUS | Status: DC
  Administered 2021-01-11 – 2021-01-14 (×4): 5 [IU] via SUBCUTANEOUS
  Filled 2021-01-11 (×4): qty 0.05

## 2021-01-11 NOTE — Progress Notes (Addendum)
ANTICOAGULATION CONSULT NOTE - Follow Up Consult  Pharmacy Consult for Heparin Indication: hx atrial fibrillation  Allergies  Allergen Reactions  . Infed [Iron Dextran] Other (See Comments)    Chest tightness  . Pentamidine Itching, Shortness Of Breath and Swelling  . Budesonide-Formoterol Fumarate     Other reaction(s): hoarseness  . Bupropion     Other reaction(s): exacerbated depression  . Crestor [Rosuvastatin]     Other reaction(s): body aches, swelling  . Duloxetine Hcl     Other reaction(s): GI SE, weepy, worsening fatigue, foggy  . Erythromycin [Erythromycin] Other (See Comments)    Mouth Ulcers  . Iohexol Other (See Comments)    Per patient "she has had a kidney transplant and should never have contrast"  . Oxycodone Nausea Only and Nausea And Vomiting  . Pravastatin Sodium     Other reaction(s): myalgias  . Erythromycin Rash    Causes breakout in mouth  . Ultram [Tramadol Hcl] Anxiety    Patient Measurements: Height: 5\' 1"  (154.9 cm) Weight: 73.9 kg (162 lb 14.7 oz) IBW/kg (Calculated) : 47.8 Heparin Dosing Weight: 65 kg  Vital Signs: Temp: 98 F (36.7 C) (02/19 1024) Temp Source: Oral (02/19 1024) BP: 133/60 (02/19 1024) Pulse Rate: 87 (02/19 1024)  Labs: Recent Labs    01/09/21 0511 01/09/21 1113 01/10/21 0438 01/11/21 0023 01/11/21 0927  HGB 7.4*  --  9.5* 8.9*  --   HCT 24.1*  --  30.0* 28.1*  --   PLT 103*  --  91* 122*  --   APTT  --  29  --   --   --   LABPROT  --  14.9  --   --   --   INR  --  1.2  --   --   --   HEPARINUNFRC  --   --   --  <0.10* <0.10*  CREATININE 1.42*  --  1.19* 1.20*  --     Estimated Creatinine Clearance: 44.1 mL/min (A) (by C-G formula based on SCr of 1.2 mg/dL (H)).   Medications:  Scheduled:  . (feeding supplement) PROSource Plus  30 mL Oral BID BM  . amLODipine  5 mg Oral Daily  . brimonidine  1 drop Both Eyes BID  . Chlorhexidine Gluconate Cloth  6 each Topical Daily  . cycloSPORINE  1 drop Both Eyes  BID  . darbepoetin (ARANESP) injection - NON-DIALYSIS  100 mcg Subcutaneous Q Tue-1800  . dorzolamide  1 drop Both Eyes Daily  . feeding supplement  237 mL Oral BID BM  . furosemide  40 mg Oral BID  . insulin aspart  0-5 Units Subcutaneous QHS  . insulin aspart  0-9 Units Subcutaneous TID WC  . latanoprost  1 drop Both Eyes QHS  . mouth rinse  15 mL Mouth Rinse BID  . metoprolol tartrate  25 mg Oral BID  . mycophenolate  180 mg Oral BID  . pantoprazole  40 mg Oral Daily  . predniSONE  7.5 mg Oral Q breakfast  . sodium hypochlorite   Irrigation BID  . tacrolimus  1 mg Oral BID  . valACYclovir  500 mg Oral BID  . cyanocobalamin  1,000 mcg Oral Daily   Infusions:  . sodium chloride Stopped (01/09/21 1250)  . ferric gluconate (FERRLECIT/NULECIT) Test Dose    . heparin 450 Units/hr (01/11/21 0315)    Assessment: 65 yo female on apixaban PTA and last inpatient dose of apixaban was on 2/5, s/p TEE/cardioversion on 12/26/2020.  Presented with perforated acute sigmoid diverticulitis s/p Hartman's on 2/6 has had oozing from wound site and anticoagulation has not been restarted as a result until 2/11 when [facility] stated could be restarted if Hgb remained stable (8.9 > 8.8). Md ordered to start IV heparin per Rx.  - Per Cards on 2/14, If no bleeding the next 24 hours recommend to restart Eliquis as recommended by surgery. Would prefer she had 4 weeks of uninterrupted anticoagulation after cardioversion.   - 2/17: heparin drip stopped d/t oozing at midline incision and extremity hematoma - 2/18: per Dr. Cyndia Skeeters, ok to resume heparin drip today after iron IV dose  Today, 01/11/2021: - heparin level remains undetectable despite rate increased to 450 units/hr early this morning - Per pt's RN,  incision site looks good today and no issues with IV line. - hgb 8.9, plts up 122K - scr 1.20 (crcl~44)   Goal of Therapy:  Heparin level 0.3-0.5 units/ml Monitor platelets by anticoagulation protocol: Yes    Plan:  - increase heparin drip to 650 units/hr - check 6 hr heparin level - monitor closely for bleeding  Dia Sitter, PharmD, BCPS 01/11/2021 10:41 AM _______________________________  Adden: Heparin level remains undetectable despite rate increased to 650 units/hr. Per pt's RN, no issues with IV line and no new bleeding noted. - increase drip to 850 units/hr - check 6 hr heparin level  Dia Sitter, PharmD, BCPS 01/11/2021 7:41 PM

## 2021-01-11 NOTE — Progress Notes (Signed)
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 fib with RVR.  She underwent TEE with DCCV on 2/6 but developed acute abdominal pain.  CT showed perforated diverticulitis.  She underwent laparotomy with Hartman's procedure but required MV and vasopressors postop.  She also stabilized in ICU and transferred back to [facility] service point 01/12/2021.  Postop complicated by slow return of bowel functions, recurrent acute on chronic kidney disease, volume overload and acute blood loss anemia.  Cardiology recommended anticoagulation at least for 3 weeks post DCCV.  Nephrology and general surgery following.  Subjective: Seen and examined earlier this morning.  No major events overnight or this morning.  She reports some nausea and vomiting.  RN noted some spitting.  Also some concern about left arm pain.  No other issues.  Objective: Vitals:   01/10/21 1556 01/10/21 2150 01/11/21 0400 01/11/21 1024  BP: (!) 145/57 140/74 (!) 159/60 133/60  Pulse: 75 83 86 87  Resp: 20 16  (!) 24  Temp: 98.4 F (36.9 C) 99.4 F (37.4 C) 98.9 F (37.2 C) 98 F (36.7 C)  TempSrc: Oral  Axillary Oral  SpO2: 99% 100% 91% 97%  Weight:      Height:        Intake/Output Summary (Last 24 hours) at 01/11/2021 1441 Last data filed at 01/11/2021 0451 Gross per 24 hour  Intake --  Output 1700 ml  Net -1700 ml   Filed Weights   01/04/21 0500 01/09/21 0516 01/10/21 0549  Weight: 80.6 kg 75.1 kg 73.9 kg    Examination:  GENERAL: No apparent distress.  Nontoxic. HEENT: MMM.  Vision  and hearing grossly intact.  NECK: Supple.  No apparent JVD.  RESP: 91% on 2 L.  No IWOB.  Fair aeration bilaterally. CVS:  RRR. Heart sounds normal.  ABD/GI/GU: BS+. Abd soft, NTND.  Abdominal dressing DCI.  Normal output from ostomy. MSK/EXT:  Moves extremities.  Swelling/edema in bilateral forearms.  Small dry blood stain on LUE dressing. SKIN: No significant skin color changes other than known laparotomy and LUE wound. NEURO: Awake, alert and oriented appropriately.  No apparent focal neuro deficit. PSYCH: Calm. Normal affect.  Procedures:  2/6-exploratory laparotomy, sigmoid colectomy and end colostomy 2/6-TEE with DCCV for A. Fib 2/6-2/8-ETT 2/14-I&D of left arm hematoma by CCS  Microbiology summarized: 2/2-COVID-19 PCR nonreactive.  Assessment & Plan: Acute on chronic hypoxemic respiratory failure due to septic shock in the setting of perforated diverticulitis/peritonitis-seems to have resolved. -ETT 2/6-2/8. -Completed antibiotic course with IV Zosyn on 2/11. -OOB/PT/OT/incentive spirometry  Septic shock due to perforated acute sigmoid diverticulitis s/p ex lap, sigmoid colectomy & end colostomy -Completed antibiotic course with IV Zosyn. -Ostomy care per surgery and wound care  A flutter with RVR s/p TEE and DCCV on 12/29/2020  -Continue metoprolol 25 mg twice daily -Cardiology recommendations:  -Uninterrupted AC for 30 days post DCCV-resumed IV heparin 2/18.  -Elective ablation in the future.  -Outpatient follow-up with [facility] EP team   ABLA superimposed on ACD-surgical blood loss and hematoma to left arm.  Hgb stable. Recent Labs  01/03/21 0720 01/04/21 0556 01/05/21 0511 01/05/21 1202 01/06/21 1018 01/07/21 0252 01/08/21 0322 01/09/21 0511 01/10/21 0438 01/11/21 0023  HGB 8.8* 8.1* 7.4* 8.2* 8.6* 7.5* 7.4* 7.4* 9.5* 8.9*  -IV iron per nephrology -Monitor CBC  Acute on chronic diastolic CHF: TTE ordered 1/25 with LVEF of 70 to 75%, G2-DD.  Some evidence  of fluid overload with bilateral lower extremity edema and extravasation, Rt>Lt. She had about 2.2 L UOP/24 hours. Creatinine improving. -Continue oral Lasix 40 mg twice daily per nephrology -Monitor fluid status, renal functions and electrolytes  AKI on CKD-3A/azotemia in patient with Hx of ESRD s/p deceased donor transplant in 2012/03/05:  Followed at [facility].  AKI seems to have resolved. Recent Labs    01/03/21 0300 01/04/21 0556 01/05/21 0511 01/06/21 0400 01/07/21 0252 01/08/21 0322 01/08/21 1021 01/09/21 0511 01/10/21 0438 01/11/21 0023  BUN 62* 67* 77* 76* 74* 81* 73* 76* 72* 64*  CREATININE 1.48* 1.48* 1.84* 1.78* 1.45* 1.44* 1.45* 1.42* 1.19* 1.20*  -Continue monitoring -Appreciate help by nephrology -Continue home Prograf, Myfortic and home prednisone  NIDDM-2 with renal disease and hyperglycemia Recent Labs  Lab 01/10/21 1149 01/10/21 1644 01/10/21 03-05-44 01/11/21 0729 01/11/21 1223  GLUCAP 184* 227* 292* 170* 201*  -Continue sensitive scale SSI -Add Lantus 5 units daily.  Left upper arm wound after hematoma evacuation-stable. -Appreciate help by general surgery.  Hypernatremia/hyperkalemia: Resolved.  Hypomagnesemia: -Replenish and recheck.  Adrenal insufficiency: Likely in the setting of acute illness -On home prednisone.  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 TBI abnormal. -Continue low-dose amlodipine -Encourage warm hand wears  Suspected OSA/chronic COPD -Nightly CPAP while in-house -Needs outpatient sleep study. -Outpatient follow-up with pulmonology/Dr. Vaughan Browner is her primary pulmonologist.   Age-indeterminate nonocclusive DVT of right PV-may not need treatment for this but Rx for DVT given immobility.  VQ scan low probability for PE. -Anticoagulation as above  Thrombocytopenia: Relatively stable. -Continue monitoring  Debility/physical deconditioning in the setting of acute illness -PT/OT  recommended SNF  Nutritional Status Body mass index is 30.78 kg/m. Nutrition Problem: Increased nutrient needs Etiology: acute illness Signs/Symptoms: estimated needs Interventions: Ensure Enlive (each supplement provides 350kcal and 20 grams of protein),Prostat,Magic cup   Stage II sacral injury: Pressure Injury 12/29/20 Sacrum Medial Stage 2 -  Partial thickness loss of dermis presenting as a shallow open injury with a red, pink wound bed without slough. (Active)  12/29/20 1130  Location: Sacrum  Location Orientation: Medial  Staging: Stage 2 -  Partial thickness loss of dermis presenting as a shallow open injury with a red, pink wound bed without slough.  Wound Description (Comments):   Present on Admission: No   DVT prophylaxis:  Place and maintain sequential compression device Start: 12/31/20 1004  Code Status: Full code Family Communication: Updated patient's daughter, [name] over the phone on 2/17. Level of care: Telemetry Status is: Inpatient  Remains inpatient appropriate because:Unsafe d/c plan, IV treatments appropriate due to intensity of illness or inability to take PO and Inpatient level of care appropriate due to severity of illness   Dispo:  Patient From: Home  Planned Disposition: [place]  Expected discharge date: 01/13/2021  Medically stable for discharge: No         Consultants:  General surgery Nephrology PCCM-signed off Cardiology-signed off   Sch Meds:  Scheduled Meds: . (feeding supplement) PROSource Plus  30 mL Oral BID BM  . amLODipine  5 mg Oral Daily  . brimonidine  1 drop Both Eyes BID  . Chlorhexidine Gluconate Cloth  6 each Topical Daily  . cycloSPORINE  1 drop Both Eyes BID  . darbepoetin (ARANESP) injection - NON-DIALYSIS  100 mcg Subcutaneous Q Tue-1800  . dorzolamide  1 drop Both Eyes Daily  . feeding supplement  237 mL Oral BID BM  . furosemide  40 mg Oral BID  . insulin aspart  0-5 Units Subcutaneous QHS   . insulin aspart  0-9 Units Subcutaneous TID WC  . latanoprost  1 drop Both Eyes QHS  . mouth rinse  15 mL Mouth Rinse BID  . metoprolol tartrate  25 mg Oral BID  . mycophenolate  180 mg Oral BID  . pantoprazole  40 mg Oral Daily  . predniSONE  7.5 mg Oral Q breakfast  . sodium hypochlorite   Irrigation BID  . tacrolimus  1 mg Oral BID  . valACYclovir  500 mg Oral BID  . cyanocobalamin  1,000 mcg Oral Daily   Continuous Infusions: . sodium chloride Stopped (01/09/21 1250)  . ferric gluconate (FERRLECIT/NULECIT) Test Dose    . heparin 650 Units/hr (01/11/21 1139)   PRN Meds:.sodium chloride, acetaminophen, fentaNYL (SUBLIMAZE) injection, hydrALAZINE, lip balm, naLOXone (NARCAN)  injection, [DISCONTINUED] ondansetron **OR** ondansetron (ZOFRAN) IV, oxyCODONE  Antimicrobials: Anti-infectives (From admission, onward)   Start     Dose/Rate Route Frequency Ordered Stop   12/31/20 2200  valACYclovir (VALTREX) tablet 500 mg        500 mg Oral 2 times daily 12/31/20 2100     12/31/20 1000  valACYclovir (VALTREX) compounded oral suspension 500 mg  Status:  Discontinued        500 mg Per Tube 2 times daily 12/30/20 2051 12/31/20 2100   12/29/20 1045  piperacillin-tazobactam (ZOSYN) IVPB 3.375 g        3.375 g 12.5 mL/hr over 240 Minutes Intravenous Every 8 hours 12/29/20 0956 01/04/21 0152   12/24/20 0800  ciprofloxacin (CIPRO) tablet 500 mg  Status:  Discontinued        500 mg Oral Daily with breakfast 12/23/20 1721 12/23/20 1737   12/23/20 2200  metroNIDAZOLE (FLAGYL) tablet 500 mg  Status:  Discontinued        500 mg Oral Every 8 hours 12/23/20 1721 12/23/20 1737   12/23/20 2200  amoxicillin-clavulanate (AUGMENTIN) 500-125 MG per tablet 500 mg  Status:  Discontinued        1 tablet Oral 2 times daily 12/23/20 1739 12/29/20 0949   12/18/20 1100  Ampicillin-Sulbactam (UNASYN) 3 g in sodium chloride 0.9 % 100 mL IVPB  Status:  Discontinued        3 g 200 mL/hr over 30 Minutes Intravenous  Every 12 hours 12/18/20 0729 12/23/20 1721   12/17/20 1400  Ampicillin-Sulbactam (UNASYN) 3 g in sodium chloride 0.9 % 100 mL IVPB  Status:  Discontinued        3 g 200 mL/hr over 30 Minutes Intravenous Every 6 hours 12/17/20 1004 12/18/20 0729   12/16/20 2200  ciprofloxacin (CIPRO) IVPB 400 mg  Status:  Discontinued        400 mg 200 mL/hr over 60 Minutes Intravenous Every 12 hours 12/16/20 1451 12/16/20 1734   12/16/20 2200  piperacillin-tazobactam (ZOSYN) IVPB 3.375 g  Status:  Discontinued        3.375 g 12.5 mL/hr over 240 Minutes Intravenous Every 8 hours 12/16/20 1734 12/17/20 0949   12/16/20 1800  metroNIDAZOLE (FLAGYL) IVPB 500 mg  Status:  Discontinued  500 mg 100 mL/hr over 60 Minutes Intravenous Every 8 hours 12/16/20 1451 12/16/20 1734   12/16/20 1515  valACYclovir (VALTREX) tablet 500 mg  Status:  Discontinued        500 mg Oral 2 times daily 12/16/20 1505 12/30/20 2048   12/16/20 0915  ciprofloxacin (CIPRO) IVPB 400 mg        400 mg 200 mL/hr over 60 Minutes Intravenous  Once 12/16/20 0912 12/16/20 1104   12/16/20 0915  metroNIDAZOLE (FLAGYL) IVPB 500 mg        500 mg 100 mL/hr over 60 Minutes Intravenous  Once 12/16/20 0912 12/16/20 1104       I have personally reviewed the following labs and images: CBC: Recent Labs  Lab 01/07/21 0252 01/08/21 0322 01/09/21 0511 01/10/21 0438 01/11/21 0023  WBC 10.1 8.4 6.8 4.4 3.1*  HGB 7.5* 7.4* 7.4* 9.5* 8.9*  HCT 24.1* 24.0* 24.1* 30.0* 28.1*  MCV 103.9* 104.8* 106.6* 102.0* 103.3*  PLT 106* 91* 103* 91* 122*   BMP &GFR Recent Labs  Lab 01/07/21 0252 01/08/21 0322 01/08/21 1021 01/09/21 0511 01/10/21 0438 01/11/21 0023  NA 137 135 135 136 136 134*  K 4.4 4.4 4.3 4.3 4.8 4.2  CL 101 100 99 101 102 98  CO2 26 27 28 26 26 28   GLUCOSE 132* 127* 167* 138* 141* 251*  BUN 74* 81* 73* 76* 72* 64*  CREATININE 1.45* 1.44* 1.45* 1.42* 1.19* 1.20*  CALCIUM 9.3 9.3 9.1 9.0 9.0 8.9  MG  --   --   --  1.7 1.6* 1.5*   PHOS 3.1 3.1  --  3.0 2.5 2.1*   Estimated Creatinine Clearance: 44.1 mL/min (A) (by C-G formula based on SCr of 1.2 mg/dL (H)). Liver & Pancreas: Recent Labs  Lab 01/08/21 0322 01/08/21 1021 01/09/21 0511 01/10/21 0438 01/11/21 0023  AST  --  10*  --   --   --   ALT  --  8  --   --   --   ALKPHOS  --  62  --   --   --   BILITOT  --  1.5*  --   --   --   PROT  --  5.4*  --   --   --   ALBUMIN 2.1* 2.3* 2.2* 2.2* 2.1*   No results for input(s): LIPASE, AMYLASE in the last 168 hours. No results for input(s): AMMONIA in the last 168 hours. Diabetic: No results for input(s): HGBA1C in the last 72 hours. Recent Labs  Lab 01/10/21 1149 01/10/21 1644 01/10/21 2145 01/11/21 0729 01/11/21 1223  GLUCAP 184* 227* 292* 170* 201*   Cardiac Enzymes: No results for input(s): CKTOTAL, CKMB, CKMBINDEX, TROPONINI in the last 168 hours. No results for input(s): PROBNP in the last 8760 hours. Coagulation Profile: Recent Labs  Lab 01/09/21 1113  INR 1.2   Thyroid Function Tests: No results for input(s): TSH, T4TOTAL, FREET4, T3FREE, THYROIDAB in the last 72 hours. Lipid Profile: No results for input(s): CHOL, HDL, LDLCALC, TRIG, CHOLHDL, LDLDIRECT in the last 72 hours. Anemia Panel: No results for input(s): VITAMINB12, FOLATE, FERRITIN, TIBC, IRON, RETICCTPCT in the last 72 hours. Urine analysis:    Component Value Date/Time   COLORURINE YELLOW 12/24/2020 1700   APPEARANCEUR CLEAR 12/24/2020 1700   LABSPEC 1.015 12/24/2020 1700   PHURINE 5.0 12/24/2020 1700   GLUCOSEU 50 (A) 12/24/2020 1700   HGBUR NEGATIVE 12/24/2020 1700   BILIRUBINUR NEGATIVE 12/24/2020 1700   KETONESUR NEGATIVE  12/24/2020 1700   PROTEINUR NEGATIVE 12/24/2020 1700   UROBILINOGEN 0.2 05/15/2013 0733   NITRITE NEGATIVE 12/24/2020 1700   LEUKOCYTESUR NEGATIVE 12/24/2020 1700   Sepsis Labs: Invalid input(s): PROCALCITONIN, [place]  Microbiology: No results found for this or any previous visit (from  the past 240 hour(s)).  Radiology Studies: No results found.    Manya Balash T. [place]  If 7PM-7AM, please contact night-coverage [email or web address] 01/11/2021, 2:41 PM

## 2021-01-11 NOTE — Progress Notes (Signed)
Pt. greed to be placed on CPAP @ this time, 2 lpm oxygen placed into circuit, tolerating well.

## 2021-01-11 NOTE — Progress Notes (Signed)
ANTICOAGULATION CONSULT NOTE - Follow Up Consult  Pharmacy Consult for Heparin Indication: hx atrial fibrillation  Allergies  Allergen Reactions  . Infed [Iron Dextran] Other (See Comments)    Chest tightness  . Pentamidine Itching, Shortness Of Breath and Swelling  . Budesonide-Formoterol Fumarate     Other reaction(s): hoarseness  . Bupropion     Other reaction(s): exacerbated depression  . Crestor [Rosuvastatin]     Other reaction(s): body aches, swelling  . Duloxetine Hcl     Other reaction(s): GI SE, weepy, worsening fatigue, foggy  . Erythromycin [Erythromycin] Other (See Comments)    Mouth Ulcers  . Iohexol Other (See Comments)    Per patient "she has had a kidney transplant and should never have contrast"  . Oxycodone Nausea Only and Nausea And Vomiting  . Pravastatin Sodium     Other reaction(s): myalgias  . Erythromycin Rash    Causes breakout in mouth  . Ultram [Tramadol Hcl] Anxiety    Patient Measurements: Height: 5\' 1"  (154.9 cm) Weight: 73.9 kg (162 lb 14.7 oz) IBW/kg (Calculated) : 47.8 Heparin Dosing Weight: 65 kg  Vital Signs: Temp: 99.4 F (37.4 C) (02/18 2150) Temp Source: Oral (02/18 1556) BP: 140/74 (02/18 2150) Pulse Rate: 83 (02/18 2150)  Labs: Recent Labs    01/08/21 0322 01/08/21 1021 01/09/21 0511 01/09/21 1113 01/10/21 0438 01/11/21 0023  HGB 7.4*  --  7.4*  --  9.5* 8.9*  HCT 24.0*  --  24.1*  --  30.0* 28.1*  PLT 91*  --  103*  --  91* 122*  APTT  --   --   --  29  --   --   LABPROT  --   --   --  14.9  --   --   INR  --   --   --  1.2  --   --   HEPARINUNFRC 0.29*  --   --   --   --  <0.10*  CREATININE 1.44*   < > 1.42*  --  1.19* 1.20*   < > = values in this interval not displayed.    Estimated Creatinine Clearance: 44.1 mL/min (A) (by C-G formula based on SCr of 1.2 mg/dL (H)).   Medications:  Scheduled:  . (feeding supplement) PROSource Plus  30 mL Oral BID BM  . amLODipine  5 mg Oral Daily  . brimonidine  1 drop  Both Eyes BID  . Chlorhexidine Gluconate Cloth  6 each Topical Daily  . cycloSPORINE  1 drop Both Eyes BID  . darbepoetin (ARANESP) injection - NON-DIALYSIS  100 mcg Subcutaneous Q Tue-1800  . dorzolamide  1 drop Both Eyes Daily  . feeding supplement  237 mL Oral BID BM  . furosemide  40 mg Oral BID  . insulin aspart  0-5 Units Subcutaneous QHS  . insulin aspart  0-9 Units Subcutaneous TID WC  . latanoprost  1 drop Both Eyes QHS  . mouth rinse  15 mL Mouth Rinse BID  . metoprolol tartrate  25 mg Oral BID  . mycophenolate  180 mg Oral BID  . pantoprazole  40 mg Oral Daily  . predniSONE  7.5 mg Oral Q breakfast  . sodium hypochlorite   Irrigation BID  . tacrolimus  1 mg Oral BID  . valACYclovir  500 mg Oral BID  . cyanocobalamin  1,000 mcg Oral Daily   Infusions:  . sodium chloride Stopped (01/09/21 1250)  . ferric gluconate (FERRLECIT/NULECIT) Test Dose    .  heparin      Assessment: 65 yo female on apixaban PTA and last inpatient dose of apixaban was on 2/5, s/p TEE/cardioversion on 12/26/2020. Presented with perforated acute sigmoid diverticulitis s/p Hartman's on 2/6 has had oozing from wound site and anticoagulation has not been restarted as a result until 2/11 when [facility] stated could be restarted if Hgb remained stable (8.9 > 8.8). Md ordered to start IV heparin per Rx, will not give bolus due to oozing and s/p multiple blood transfusions the last few days.    - Per Cards on 2/14, If no bleeding the next 24 hours recommend to restart Eliquis as recommended by surgery. Would prefer she had 4 weeks of uninterrupted anticoagulation after cardioversion.   - 2/17: heparin drip stopped d/t oozing at midline incision and extremity hematoma - 2/18: per Dr. Cyndia Skeeters, ok to resume heparin drip today after iron IV dose  Today, 01/11/2021: - Heparin level < 0.1 with heparin gtt @ 250 units/hr (previously therapeutic on this rate).  Per RN, no disruption in heparin infusion - Per pt's RN,  incision  site looks good - hgb 8.9 (s/p 1 unit PRBC on 2/17), plts 122K - scr trending down 1.2 (crcl~44)   Goal of Therapy:  Heparin level 0.3-0.5 units/ml Monitor platelets by anticoagulation protocol: Yes   Plan:  - increase heparin drip to 400 units/hr at 4p - check 6 hr heparin level - monitor closely for bleeding  Leone Haven, PharmD 01/11/2021 3:08 AM

## 2021-01-11 NOTE — Progress Notes (Signed)
13 Days Post-Op   Subjective/Chief Complaint: Awake alert  Feels about the same as yesterday \\does  not ambulate well at baseline    Objective: Vital signs in last 24 hours: Temp:  [97.4 F (36.3 C)-99.4 F (37.4 C)] 98.9 F (37.2 C) (02/19 0400) Pulse Rate:  [71-86] 86 (02/19 0400) Resp:  [16-20] 16 (02/18 2150) BP: (119-159)/(57-74) 159/60 (02/19 0400) SpO2:  [91 %-100 %] 91 % (02/19 0400) Last BM Date: 01/10/21  Intake/Output from previous day: 02/18 0701 - 02/19 0700 In: -  Out: 2250 [Urine:2200; Stool:50] Intake/Output this shift: No intake/output data recorded.   Gen: Alert, NAD, frail Pulm:rate and effort normal. On 2L supplemental O2 via  [facility]:XNAT, nondistended, tender over incision otherwise nontender, ostomy viable with new pouch in place, open midline wound  some visible sutures and fibrinous exudate at the base - green drainage noted on dressing but minimal today Fascia intact but some areas of necrosis   Lab Results:  Recent Labs    01/10/21 0438 01/11/21 0023  WBC 4.4 3.1*  HGB 9.5* 8.9*  HCT 30.0* 28.1*  PLT 91* 122*   BMET Recent Labs    01/10/21 0438 01/11/21 0023  NA 136 134*  K 4.8 4.2  CL 102 98  CO2 26 28  GLUCOSE 141* 251*  BUN 72* 64*  CREATININE 1.19* 1.20*  CALCIUM 9.0 8.9   PT/INR Recent Labs    01/09/21 1113  LABPROT 14.9  INR 1.2   ABG No results for input(s): PHART, HCO3 in the last 72 hours.  Invalid input(s): PCO2, PO2  Studies/Results: No results found.  Anti-infectives: Anti-infectives (From admission, onward)   Start     Dose/Rate Route Frequency Ordered Stop   12/31/20 2200  valACYclovir (VALTREX) tablet 500 mg        500 mg Oral 2 times daily 12/31/20 2100     12/31/20 1000  valACYclovir (VALTREX) compounded oral suspension 500 mg  Status:  Discontinued        500 mg Per Tube 2 times daily 12/30/20 2051 12/31/20 2100   12/29/20 1045  piperacillin-tazobactam (ZOSYN) IVPB 3.375 g        3.375 g 12.5  mL/hr over 240 Minutes Intravenous Every 8 hours 12/29/20 0956 01/04/21 0152   12/24/20 0800  ciprofloxacin (CIPRO) tablet 500 mg  Status:  Discontinued        500 mg Oral Daily with breakfast 12/23/20 1721 12/23/20 1737   12/23/20 2200  metroNIDAZOLE (FLAGYL) tablet 500 mg  Status:  Discontinued        500 mg Oral Every 8 hours 12/23/20 1721 12/23/20 1737   12/23/20 2200  amoxicillin-clavulanate (AUGMENTIN) 500-125 MG per tablet 500 mg  Status:  Discontinued        1 tablet Oral 2 times daily 12/23/20 1739 12/29/20 0949   12/18/20 1100  Ampicillin-Sulbactam (UNASYN) 3 g in sodium chloride 0.9 % 100 mL IVPB  Status:  Discontinued        3 g 200 mL/hr over 30 Minutes Intravenous Every 12 hours 12/18/20 0729 12/23/20 1721   12/17/20 1400  Ampicillin-Sulbactam (UNASYN) 3 g in sodium chloride 0.9 % 100 mL IVPB  Status:  Discontinued        3 g 200 mL/hr over 30 Minutes Intravenous Every 6 hours 12/17/20 1004 12/18/20 0729   12/16/20 2200  ciprofloxacin (CIPRO) IVPB 400 mg  Status:  Discontinued        400 mg 200 mL/hr over 60 Minutes Intravenous Every 12  hours 12/16/20 1451 12/16/20 1734   12/16/20 2200  piperacillin-tazobactam (ZOSYN) IVPB 3.375 g  Status:  Discontinued        3.375 g 12.5 mL/hr over 240 Minutes Intravenous Every 8 hours 12/16/20 1734 12/17/20 0949   12/16/20 1800  metroNIDAZOLE (FLAGYL) IVPB 500 mg  Status:  Discontinued        500 mg 100 mL/hr over 60 Minutes Intravenous Every 8 hours 12/16/20 1451 12/16/20 1734   12/16/20 1515  valACYclovir (VALTREX) tablet 500 mg  Status:  Discontinued        500 mg Oral 2 times daily 12/16/20 1505 12/30/20 2048   12/16/20 0915  ciprofloxacin (CIPRO) IVPB 400 mg        400 mg 200 mL/hr over 60 Minutes Intravenous  Once 12/16/20 0912 12/16/20 1104   12/16/20 0915  metroNIDAZOLE (FLAGYL) IVPB 500 mg        500 mg 100 mL/hr over 60 Minutes Intravenous  Once 12/16/20 0912 12/16/20 1104      Assessment/Plan: s/p Procedure(s) with  comments: EXPLORATORY LAPAROTOMY, SIGMOID COLECTOMY AND COLOSTOMY (N/A) - Packed Dressing Polycystic disease with renal transplant 2013-Prograf/prednisone.Renal signed off 2/16 Elevated Cr CHF/uncertain etiology -Cards following Chronic COPD/history of tobacco use- On 2L PRN at home Pleuritic chest pain and SOB- VQ 1/25:Low probability pulmonary embolus. Age indeterminate, non occlusive deep vein thrombosis involving the right popliteal vein Hx SVT Atrial flutter with RVR- on eliquis, which is currently on hold Large hematoma left arm - drained and dressed- cleaning up hematoma still present Anemia - Hbg 8.1>>7.4>>8.2>>7.5>>7.4>>7.4>>9.5; transfusion per Hospitalists and Renal Severe protein calorie malnutrition- prealbumin 10.3 (2/17). Dietician consult - Per medicine -  Perforated sigmoid diverticulitis -S/pExploratory laparotomy with sigmoid colectomy/end colostomy, 12/29/2020 Dr. Coralie Keens - POD#13 - WBC WNL, afebrile - ostomy functioning. WOC following for new ostomy teaching  - continue  Dakins to abdominal wound wet to dry dressing change x3 days for pseudomonas (to start 2/18 during next dressing change), change BID - Advance to CM diet. Encourage PO intake. Continue protein supplements.  - Continue PT/OT, mobilize as able. Currently recommending SNF  [id number], supplements per RD ID: Zosyn 2/6-2/12 DVT: Heparinheld per primary - patient is high risk for complications therefore would favor waiting to start PO anticoagulation until she is closer to discharge Follow-up: Dr. Ninfa Linden   LOS: 26 days    Turner Daniels MD  01/11/2021

## 2021-01-12 ENCOUNTER — Inpatient Hospital Stay (HOSPITAL_COMMUNITY): Payer: Medicare Other

## 2021-01-12 DIAGNOSIS — Z940 Kidney transplant status: Secondary | ICD-10-CM | POA: Diagnosis not present

## 2021-01-12 DIAGNOSIS — I5031 Acute diastolic (congestive) heart failure: Secondary | ICD-10-CM | POA: Diagnosis not present

## 2021-01-12 DIAGNOSIS — M25521 Pain in right elbow: Secondary | ICD-10-CM

## 2021-01-12 DIAGNOSIS — K5732 Diverticulitis of large intestine without perforation or abscess without bleeding: Secondary | ICD-10-CM | POA: Diagnosis not present

## 2021-01-12 DIAGNOSIS — N179 Acute kidney failure, unspecified: Secondary | ICD-10-CM | POA: Diagnosis not present

## 2021-01-12 LAB — RENAL FUNCTION PANEL
Albumin: 1.9 g/dL — ABNORMAL LOW (ref 3.5–5.0)
Anion gap: 7 (ref 5–15)
BUN: 54 mg/dL — ABNORMAL HIGH (ref 8–23)
CO2: 28 mmol/L (ref 22–32)
Calcium: 8.7 mg/dL — ABNORMAL LOW (ref 8.9–10.3)
Chloride: 96 mmol/L — ABNORMAL LOW (ref 98–111)
Creatinine, Ser: 1.03 mg/dL — ABNORMAL HIGH (ref 0.44–1.00)
GFR, Estimated: >60 mL/min (ref >60)
Glucose, Bld: 189 mg/dL — ABNORMAL HIGH (ref 70–99)
Phosphorus: 1.8 mg/dL — ABNORMAL LOW (ref 2.5–4.6)
Potassium: 3.9 mmol/L (ref 3.5–5.1)
Sodium: 131 mmol/L — ABNORMAL LOW (ref 135–145)

## 2021-01-12 LAB — HEPARIN LEVEL (UNFRACTIONATED)
Heparin Unfractionated: <0.1 IU/mL — ABNORMAL LOW (ref 0.30–0.70)
Heparin Unfractionated: 0.11 IU/mL — ABNORMAL LOW (ref 0.30–0.70)
Heparin Unfractionated: 0.13 IU/mL — ABNORMAL LOW (ref 0.30–0.70)

## 2021-01-12 LAB — CBC
HCT: 25.2 % — ABNORMAL LOW (ref 36.0–46.0)
Hemoglobin: 8.1 g/dL — ABNORMAL LOW (ref 12.0–15.0)
MCH: 33.1 pg (ref 26.0–34.0)
MCHC: 32.1 g/dL (ref 30.0–36.0)
MCV: 102.9 fL — ABNORMAL HIGH (ref 80.0–100.0)
Platelets: 100 10*3/uL — ABNORMAL LOW (ref 150–400)
RBC: 2.45 MIL/uL — ABNORMAL LOW (ref 3.87–5.11)
RDW: 20.4 % — ABNORMAL HIGH (ref 11.5–15.5)
WBC: 2.5 10*3/uL — ABNORMAL LOW (ref 4.0–10.5)
nRBC: 0 % (ref 0.0–0.2)

## 2021-01-12 LAB — GLUCOSE, CAPILLARY
Glucose-Capillary: 157 mg/dL — ABNORMAL HIGH (ref 70–99)
Glucose-Capillary: 166 mg/dL — ABNORMAL HIGH (ref 70–99)
Glucose-Capillary: 181 mg/dL — ABNORMAL HIGH (ref 70–99)
Glucose-Capillary: 183 mg/dL — ABNORMAL HIGH (ref 70–99)
Glucose-Capillary: 227 mg/dL — ABNORMAL HIGH (ref 70–99)

## 2021-01-12 LAB — URIC ACID: Uric Acid, Serum: 8.4 mg/dL — ABNORMAL HIGH (ref 2.5–7.1)

## 2021-01-12 LAB — MAGNESIUM: Magnesium: 1.8 mg/dL (ref 1.7–2.4)

## 2021-01-12 MED ORDER — COLCHICINE 0.6 MG PO TABS
0.6000 mg | ORAL_TABLET | Freq: Every day | ORAL | Status: DC
  Administered 2021-01-13 – 2021-01-14 (×2): 0.6 mg via ORAL
  Filled 2021-01-12 (×2): qty 1

## 2021-01-12 MED ORDER — COLCHICINE 0.6 MG PO TABS
1.2000 mg | ORAL_TABLET | Freq: Once | ORAL | Status: AC
  Administered 2021-01-12: 1.2 mg via ORAL
  Filled 2021-01-12: qty 2

## 2021-01-12 NOTE — Progress Notes (Signed)
14 Days Post-Op   Subjective/Chief Complaint: Patient very weak but awake.  Complains of pain "all over body".  Extremely debilitated.   Objective: Vital signs in last 24 hours: Temp:  [97.5 F (36.4 C)-98 F (36.7 C)] 97.5 F (36.4 C) (02/20 0520) Pulse Rate:  [42-87] 42 (02/20 0520) Resp:  [16-24] 16 (02/19 1516) BP: (133-155)/(60-77) 133/77 (02/20 0520) SpO2:  [97 %-100 %] 97 % (02/20 0520) Last BM Date: 01/11/21  Intake/Output from previous day: 02/19 0701 - 02/20 0700 In: -  Out: 1250 [Urine:1250] Intake/Output this shift: No intake/output data recorded.   Gen: Alert, NAD, frail Pulm:rate and effort normal. On 2L supplemental O2 via [place] CHE:NIDP, nondistended, tender over incisionotherwise nontender, ostomyviable with new pouch in place,open midline wound some visible sutures and fibrinous exudate at the base- green drainage noted on dressing but minimal today Fascia intact but some areas of necrosis  Lab Results:  Recent Labs    01/11/21 0023 01/12/21 0250  WBC 3.1* 2.5*  HGB 8.9* 8.1*  HCT 28.1* 25.2*  PLT 122* 100*   BMET Recent Labs    01/11/21 0023 01/12/21 0250  NA 134* 131*  K 4.2 3.9  CL 98 96*  CO2 28 28  GLUCOSE 251* 189*  BUN 64* 54*  CREATININE 1.20* 1.03*  CALCIUM 8.9 8.7*   PT/INR Recent Labs    01/09/21 1113  LABPROT 14.9  INR 1.2   ABG No results for input(s): PHART, HCO3 in the last 72 hours.  Invalid input(s): PCO2, PO2  Studies/Results: No results found.  Anti-infectives: Anti-infectives (From admission, onward)   Start     Dose/Rate Route Frequency Ordered Stop   12/31/20 2200  valACYclovir (VALTREX) tablet 500 mg        500 mg Oral 2 times daily 12/31/20 2100     12/31/20 1000  valACYclovir (VALTREX) compounded oral suspension 500 mg  Status:  Discontinued        500 mg Per Tube 2 times daily 12/30/20 2051 12/31/20 2100   12/29/20 1045  piperacillin-tazobactam (ZOSYN) IVPB 3.375 g        3.375 g 12.5 mL/hr  over 240 Minutes Intravenous Every 8 hours 12/29/20 0956 01/04/21 0152   12/24/20 0800  ciprofloxacin (CIPRO) tablet 500 mg  Status:  Discontinued        500 mg Oral Daily with breakfast 12/23/20 1721 12/23/20 1737   12/23/20 2200  metroNIDAZOLE (FLAGYL) tablet 500 mg  Status:  Discontinued        500 mg Oral Every 8 hours 12/23/20 1721 12/23/20 1737   12/23/20 2200  amoxicillin-clavulanate (AUGMENTIN) 500-125 MG per tablet 500 mg  Status:  Discontinued        1 tablet Oral 2 times daily 12/23/20 1739 12/29/20 0949   12/18/20 1100  Ampicillin-Sulbactam (UNASYN) 3 g in sodium chloride 0.9 % 100 mL IVPB  Status:  Discontinued        3 g 200 mL/hr over 30 Minutes Intravenous Every 12 hours 12/18/20 0729 12/23/20 1721   12/17/20 1400  Ampicillin-Sulbactam (UNASYN) 3 g in sodium chloride 0.9 % 100 mL IVPB  Status:  Discontinued        3 g 200 mL/hr over 30 Minutes Intravenous Every 6 hours 12/17/20 1004 12/18/20 0729   12/16/20 2200  ciprofloxacin (CIPRO) IVPB 400 mg  Status:  Discontinued        400 mg 200 mL/hr over 60 Minutes Intravenous Every 12 hours 12/16/20 1451 12/16/20 1734   12/16/20  2200  piperacillin-tazobactam (ZOSYN) IVPB 3.375 g  Status:  Discontinued        3.375 g 12.5 mL/hr over 240 Minutes Intravenous Every 8 hours 12/16/20 1734 12/17/20 0949   12/16/20 1800  metroNIDAZOLE (FLAGYL) IVPB 500 mg  Status:  Discontinued        500 mg 100 mL/hr over 60 Minutes Intravenous Every 8 hours 12/16/20 1451 12/16/20 1734   12/16/20 1515  valACYclovir (VALTREX) tablet 500 mg  Status:  Discontinued        500 mg Oral 2 times daily 12/16/20 1505 12/30/20 2048   12/16/20 0915  ciprofloxacin (CIPRO) IVPB 400 mg        400 mg 200 mL/hr over 60 Minutes Intravenous  Once 12/16/20 0912 12/16/20 1104   12/16/20 0915  metroNIDAZOLE (FLAGYL) IVPB 500 mg        500 mg 100 mL/hr over 60 Minutes Intravenous  Once 12/16/20 0912 12/16/20 1104      Assessment/Plan: s/p Procedure(s) with  comments: EXPLORATORY LAPAROTOMY, SIGMOID COLECTOMY AND COLOSTOMY (N/A) - Packed Dressing    Polycystic disease with renal transplant 2013-Prograf/prednisone.Renal signed off 2/16 Elevated Cr CHF/uncertain etiology -Cards following Chronic COPD/history of tobacco use- On 2L PRN at home Pleuritic chest pain and SOB- VQ 1/25:Low probability pulmonary embolus. Age indeterminate, non occlusive deep vein thrombosis involving the right popliteal vein Hx SVT Atrial flutter with RVR- on eliquis, which is currently on hold Large hematoma left arm - drained and dressed- cleaning up hematoma still present Anemia - Hbg 8.1>>7.4>>8.2>>7.5>>7.4>>7.4>>9.5; transfusion per Hospitalists and Renal Severe protein calorie malnutrition- prealbumin 10.3 (2/17). Dietician consult - Per medicine -  Perforated sigmoid diverticulitis -S/pExploratory laparotomy with sigmoid colectomy/end colostomy, 12/29/2020 Dr. Coralie Keens - POD#14 - WBC WNL, afebrile - ostomy functioning. WOC following for new ostomy teaching  - continue  Dakins to abdominal wound wet to dry dressing change x3 days for pseudomonas (to start 2/18 during next dressing change), change BID - Advance to CM diet. Encourage PO intake. Continue protein supplements. - Continue PT/OT, mobilize as able. Currently recommending SNF  YKD:[id number], supplements per RD ID: Zosyn 2/6-2/12 DVT: Heparinheldper primary - patient is high risk for complications therefore would favor waiting to start PO anticoagulation until she is closer to discharge May consider restarting po anticoagulation this week since she is 2 weeks out now  Follow-up: Dr. Ninfa Linden     LOS: 27 days    Turner Daniels MD  01/12/2021

## 2021-01-12 NOTE — Progress Notes (Signed)
ANTICOAGULATION CONSULT NOTE - Follow Up Consult  Pharmacy Consult for Heparin Indication: hx atrial fibrillation  Allergies  Allergen Reactions  . Infed [Iron Dextran] Other (See Comments)    Chest tightness  . Pentamidine Itching, Shortness Of Breath and Swelling  . Budesonide-Formoterol Fumarate     Other reaction(s): hoarseness  . Bupropion     Other reaction(s): exacerbated depression  . Crestor [Rosuvastatin]     Other reaction(s): body aches, swelling  . Duloxetine Hcl     Other reaction(s): GI SE, weepy, worsening fatigue, foggy  . Erythromycin [Erythromycin] Other (See Comments)    Mouth Ulcers  . Iohexol Other (See Comments)    Per patient "she has had a kidney transplant and should never have contrast"  . Oxycodone Nausea Only and Nausea And Vomiting  . Pravastatin Sodium     Other reaction(s): myalgias  . Erythromycin Rash    Causes breakout in mouth  . Ultram [Tramadol Hcl] Anxiety    Patient Measurements: Height: 5\' 1"  (154.9 cm) Weight: 73.9 kg (162 lb 14.7 oz) IBW/kg (Calculated) : 47.8 Heparin Dosing Weight: 65 kg  Vital Signs: Temp: 98 F (36.7 C) (02/19 2134) Temp Source: Oral (02/19 2134) BP: 150/74 (02/19 2134) Pulse Rate: 75 (02/19 2134)  Labs: Recent Labs    01/09/21 1113 01/10/21 0438 01/10/21 0438 01/11/21 0023 01/11/21 0927 01/11/21 1812 01/12/21 0250  HGB  --  9.5*  --  8.9*  --   --  8.1*  HCT  --  30.0*  --  28.1*  --   --  25.2*  PLT  --  91*  --  122*  --   --  100*  APTT 29  --   --   --   --   --   --   LABPROT 14.9  --   --   --   --   --   --   INR 1.2  --   --   --   --   --   --   HEPARINUNFRC  --   --    < > <0.10* <0.10* <0.10* <0.10*  CREATININE  --  1.19*  --  1.20*  --   --  1.03*   < > = values in this interval not displayed.    Estimated Creatinine Clearance: 51.4 mL/min (A) (by C-G formula based on SCr of 1.03 mg/dL (H)).   Medications:  Scheduled:  . (feeding supplement) PROSource Plus  30 mL Oral BID  BM  . amLODipine  5 mg Oral Daily  . brimonidine  1 drop Both Eyes BID  . Chlorhexidine Gluconate Cloth  6 each Topical Daily  . cycloSPORINE  1 drop Both Eyes BID  . darbepoetin (ARANESP) injection - NON-DIALYSIS  100 mcg Subcutaneous Q Tue-1800  . dorzolamide  1 drop Both Eyes Daily  . feeding supplement  237 mL Oral BID BM  . furosemide  40 mg Oral BID  . insulin aspart  0-5 Units Subcutaneous QHS  . insulin aspart  0-9 Units Subcutaneous TID WC  . insulin glargine  5 Units Subcutaneous Daily  . latanoprost  1 drop Both Eyes QHS  . mouth rinse  15 mL Mouth Rinse BID  . metoprolol tartrate  25 mg Oral BID  . mycophenolate  180 mg Oral BID  . pantoprazole  40 mg Oral Daily  . predniSONE  7.5 mg Oral Q breakfast  . sodium hypochlorite   Irrigation BID  . tacrolimus  1 mg Oral BID  . valACYclovir  500 mg Oral BID  . cyanocobalamin  1,000 mcg Oral Daily   Infusions:  . sodium chloride Stopped (01/09/21 1250)  . ferric gluconate (FERRLECIT/NULECIT) Test Dose    . heparin 850 Units/hr (01/11/21 1900)    Assessment: 65 yo female on apixaban PTA and last inpatient dose of apixaban was on 2/5, s/p TEE/cardioversion on 12/26/2020. Presented with perforated acute sigmoid diverticulitis s/p Hartman's on 2/6 has had oozing from wound site and anticoagulation has not been restarted as a result until 2/11 when [facility] stated could be restarted if Hgb remained stable (8.9 > 8.8). Md ordered to start IV heparin per Rx.  - Per Cards on 2/14, If no bleeding the next 24 hours recommend to restart Eliquis as recommended by surgery. Would prefer she had 4 weeks of uninterrupted anticoagulation after cardioversion.   - 2/17: heparin drip stopped d/t oozing at midline incision and extremity hematoma - 2/18: per Dr. Cyndia Skeeters, ok to resume heparin drip today after iron IV dose  Today, 01/12/2021: - heparin level remains undetectable despite rate increased to 850 units/hr - No complications of therapy noted -  hgb 8.1, plts up 100K - scr 1.03 (crcl~51)   Goal of Therapy:  Heparin level 0.3-0.5 units/ml Monitor platelets by anticoagulation protocol: Yes   Plan:  - increase heparin drip to 1100 units/hr - check 6 hr heparin level - monitor closely for bleeding  Leone Haven, PharmD 01/12/2021 4:38 AM

## 2021-01-12 NOTE — Progress Notes (Addendum)
ANTICOAGULATION CONSULT NOTE - Follow Up Consult  Pharmacy Consult for Heparin Indication: hx atrial fibrillation  Allergies  Allergen Reactions  . Infed [Iron Dextran] Other (See Comments)    Chest tightness  . Pentamidine Itching, Shortness Of Breath and Swelling  . Budesonide-Formoterol Fumarate     Other reaction(s): hoarseness  . Bupropion     Other reaction(s): exacerbated depression  . Crestor [Rosuvastatin]     Other reaction(s): body aches, swelling  . Duloxetine Hcl     Other reaction(s): GI SE, weepy, worsening fatigue, foggy  . Erythromycin [Erythromycin] Other (See Comments)    Mouth Ulcers  . Iohexol Other (See Comments)    Per patient "she has had a kidney transplant and should never have contrast"  . Oxycodone Nausea Only and Nausea And Vomiting  . Pravastatin Sodium     Other reaction(s): myalgias  . Erythromycin Rash    Causes breakout in mouth  . Ultram [Tramadol Hcl] Anxiety    Patient Measurements: Height: 5\' 1"  (154.9 cm) Weight: 73.9 kg (162 lb 14.7 oz) IBW/kg (Calculated) : 47.8 Heparin Dosing Weight: 65 kg  Vital Signs: Temp: 97.5 F (36.4 C) (02/20 0520) Temp Source: Oral (02/20 0520) BP: 133/77 (02/20 0520) Pulse Rate: 75 (02/20 0851)  Labs: Recent Labs    01/09/21 1113 01/10/21 0438 01/10/21 0438 01/11/21 0023 01/11/21 0927 01/11/21 1812 01/12/21 0250  HGB  --  9.5*   < > 8.9*  --   --  8.1*  HCT  --  30.0*  --  28.1*  --   --  25.2*  PLT  --  91*  --  122*  --   --  100*  APTT 29  --   --   --   --   --   --   LABPROT 14.9  --   --   --   --   --   --   INR 1.2  --   --   --   --   --   --   HEPARINUNFRC  --   --    < > <0.10* <0.10* <0.10* <0.10*  CREATININE  --  1.19*  --  1.20*  --   --  1.03*   < > = values in this interval not displayed.    Estimated Creatinine Clearance: 51.4 mL/min (A) (by C-G formula based on SCr of 1.03 mg/dL (H)).   Medications:  Scheduled:  . (feeding supplement) PROSource Plus  30 mL Oral  BID BM  . amLODipine  5 mg Oral Daily  . brimonidine  1 drop Both Eyes BID  . Chlorhexidine Gluconate Cloth  6 each Topical Daily  . cycloSPORINE  1 drop Both Eyes BID  . darbepoetin (ARANESP) injection - NON-DIALYSIS  100 mcg Subcutaneous Q Tue-1800  . dorzolamide  1 drop Both Eyes Daily  . feeding supplement  237 mL Oral BID BM  . furosemide  40 mg Oral BID  . insulin aspart  0-5 Units Subcutaneous QHS  . insulin aspart  0-9 Units Subcutaneous TID WC  . insulin glargine  5 Units Subcutaneous Daily  . latanoprost  1 drop Both Eyes QHS  . mouth rinse  15 mL Mouth Rinse BID  . metoprolol tartrate  25 mg Oral BID  . mycophenolate  180 mg Oral BID  . pantoprazole  40 mg Oral Daily  . predniSONE  7.5 mg Oral Q breakfast  . sodium hypochlorite   Irrigation BID  . tacrolimus  1 mg Oral BID  . valACYclovir  500 mg Oral BID  . cyanocobalamin  1,000 mcg Oral Daily   Infusions:  . sodium chloride Stopped (01/09/21 1250)  . ferric gluconate (FERRLECIT/NULECIT) Test Dose    . heparin 1,100 Units/hr (01/12/21 0452)    Assessment: 65 yo female on apixaban PTA and last inpatient dose of apixaban was on 2/5, s/p TEE/cardioversion on 12/26/2020. Presented with perforated acute sigmoid diverticulitis s/p Hartman's on 2/6 has had oozing from wound site and anticoagulation has not been restarted as a result until 2/11 when [facility] stated could be restarted if Hgb remained stable (8.9 > 8.8). Md ordered to start IV heparin per Rx.  - Per Cards on 2/14, If no bleeding the next 24 hours recommend to restart Eliquis as recommended by surgery. Would prefer she had 4 weeks of uninterrupted anticoagulation after cardioversion.   - 2/17: heparin drip stopped d/t oozing at midline incision and extremity hematoma - 2/18: per Dr. Cyndia Skeeters, ok to resume heparin drip today after iron IV dose  Today, 01/12/2021: - heparin level remains sub-therapeutic at 0.11 with heparin drip infusing at 1100 units/hr - Per pt's RN,   incision site looks good today, hematoma on arm remains stable, and no issues with IV line. - hgb trending down with 8.1 plts 100K - scr down 1.03 (crcl~51)   Goal of Therapy:  Heparin level 0.3-0.5 units/ml Monitor platelets by anticoagulation protocol: Yes   Plan:  - increase heparin drip to 1300 units/hr - check 6 hr heparin level - monitor closely for bleeding  Dia Sitter, PharmD, BCPS 01/12/2021 10:37 AM   _______________________________ Adden: heparin level is sub-therapeutic, but increased to 0.13 with rate increased to 1300 units/hr. Per pt;s RN, no issues with IV line - increase drip to 1500 units/hr - check 6 hr heparin level  Dia Sitter, PharmD, BCPS 01/12/2021 6:36 PM

## 2021-01-12 NOTE — Progress Notes (Signed)
PROGRESS NOTE  April [name] WHQ:[id number] DOB: 1956/07/15   PCP: [name], MD  Patient is from: Home  DOA: 12/16/2020 LOS: 27  Chief complaints: Abdominal pain and fever  Brief Narrative / Interim history: 65 year old F with PMH of severe persistent asthma, chronic hypoxic RF on 2 L as needed, ESRD s/p deceased renal transplant in 02/22/12 on immunomodulators and followed at [place], diastolic CHF, MGUS and HTN admitted for uncomplicated sigmoid diverticulitis and new onset A. fib with RVR.  She underwent TEE with DCCV on 2/6 but developed acute abdominal pain.  CT showed perforated diverticulitis.  She underwent laparotomy with Hartman's procedure but required MV and vasopressors postop.  She also stabilized in ICU and transferred back to [facility] service point 01/12/2021.  Postop complicated by slow return of bowel functions, recurrent acute on chronic kidney disease, volume overload and acute blood loss anemia.  Cardiology recommended anticoagulation at least for 3 weeks post DCCV.  Nephrology and general surgery following.  Subjective: Seen and examined earlier this morning.  Reports pain all over mainly in her abdomen and right elbow.  No nausea or vomiting.  Normal output from ostomy.  Denies chest pain or dyspnea.   Objective: Vitals:   01/11/21 1516 01/11/21 02-21-33 01/12/21 0520 01/12/21 0851  BP: (!) 155/75 (!) 150/74 133/77   Pulse: 76 75 (!) 42 75  Resp: 16     Temp: 97.9 F (36.6 C) 98 F (36.7 C) (!) 97.5 F (36.4 C)   TempSrc: Oral Oral Oral   SpO2: 100% 98% 97%   Weight:      Height:        Intake/Output Summary (Last 24 hours) at 01/12/2021 1231 Last data filed at 01/12/2021 0900 Gross per 24 hour  Intake 240 ml  Output 2150 ml  Net -1910 ml   Filed Weights   01/04/21 0500 01/09/21 0516 01/10/21 0549  Weight: 80.6 kg 75.1 kg 73.9 kg    Examination:  GENERAL: Appears weak.  Nontoxic. HEENT: MMM.  Vision and hearing grossly intact.  NECK: Supple.  No apparent  JVD.  RESP: 97% on 2 L.  No IWOB.  Fair aeration bilaterally. CVS:  RRR. Heart sounds normal.  ABD/GI/GU: BS+. Abd soft, NTND.  Dressing DCI.  Normal output from ostomy. MSK/EXT:  Moves extremities.  Mild residual edema in both upper extremities.  Diffuse tenderness around her right elbow.  No significant erythema or increased warmth to touch.  Dressing over LUE DCI. SKIN: Dressing over laparotomy wound and LUE wound DCI. NEURO: Awake, alert and oriented appropriately.  No apparent focal neuro deficit. PSYCH: Calm. Normal affect.  Procedures:  2/6-exploratory laparotomy, sigmoid colectomy and end colostomy 2/6-TEE with DCCV for A. Fib 2/6-2/8-ETT 2/14-I&D of left arm hematoma by CCS  Microbiology summarized: 2/2-COVID-19 PCR nonreactive.  Assessment & Plan: Acute on chronic hypoxemic respiratory failure due to septic shock in the setting of perforated diverticulitis/peritonitis-seems to have resolved. -ETT 2/6-2/8. -Completed antibiotic course with IV Zosyn on 2/12. -OOB/PT/OT/incentive spirometry  Septic shock due to perforated acute sigmoid diverticulitis s/p ex lap, sigmoid colectomy & end colostomy -Completed antibiotic course with IV Zosyn. -Ostomy care per surgery and wound care  A flutter with RVR s/p TEE and DCCV on 12/29/2020  -Continue metoprolol 25 mg twice daily -Cardiology recommendations:  -Uninterrupted AC for 30 days post DCCV-resumed IV heparin on 2/18.  We may be able to switch to Eliquis per surgery.  -Elective ablation in the future.  -Outpatient follow-up with [facility] EP team  ABLA superimposed on ACD-surgical blood loss and hematoma to left arm.  Initial rise from 7.4-9.5 after 1 unit likely exaggerated response.  Some dilutional pattern from morning lab Recent Labs    01/04/21 0556 01/05/21 0511 01/05/21 1202 01/06/21 1018 01/07/21 0252 01/08/21 0322 01/09/21 0511 01/10/21 0438 01/11/21 0023 01/12/21 0250  HGB 8.1* 7.4* 8.2* 8.6* 7.5* 7.4* 7.4* 9.5*  8.9* 8.1*  -IV iron per nephrology -Monitor CBC  Acute on chronic diastolic CHF: TTE ordered 1/25 with LVEF of 70 to 75%, G2-DD.  Some evidence of fluid overload with bilateral lower extremity edema and extravasation, Rt>Lt. She had about 1.3 L UOP/24 hours. Creatinine improving. -Continue oral Lasix 40 mg twice daily per nephrology -Monitor fluid status, renal functions and electrolytes  AKI on CKD-3A/azotemia in patient with Hx of ESRD s/p deceased donor transplant in Feb 18, 2012:  Followed at [facility].  AKI resolved. Recent Labs    01/04/21 0556 01/05/21 0511 01/06/21 0400 01/07/21 0252 01/08/21 0322 01/08/21 1021 01/09/21 0511 01/10/21 0438 01/11/21 0023 01/12/21 0250  BUN 67* 77* 76* 74* 81* 73* 76* 72* 64* 54*  CREATININE 1.48* 1.84* 1.78* 1.45* 1.44* 1.45* 1.42* 1.19* 1.20* 1.03*  -Continue monitoring -Appreciate help by nephrology -Continue home Prograf, Myfortic and home prednisone  NIDDM-2 with renal disease and hyperglycemia Recent Labs  Lab 01/11/21 0729 01/11/21 1223 01/11/21 1640 01/12/21 0007 01/12/21 0831  GLUCAP 170* 201* 244* 183* 157*  -Continue sensitive scale SSI -Continue Lantus 5 units daily.  Left upper arm wound after hematoma evacuation-stable. -Appreciate help by general surgery.  Right elbow pain -X-ray and uric acid  Hypernatremia/hyperkalemia: Resolved.  Hypomagnesemia: -Replenish and recheck.  Adrenal insufficiency: Likely in the setting of acute illness -On home prednisone.  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 TBI abnormal. -Continue low-dose amlodipine -Encourage warm hand wears  Suspected OSA/chronic COPD -Nightly CPAP while in-house -Needs outpatient sleep study. -Outpatient follow-up with pulmonology/Dr. Vaughan Browner is her primary pulmonologist.   Age-indeterminate nonocclusive DVT of right PV-may not need treatment for this but Rx for DVT given immobility.  VQ scan low  probability for PE. -Anticoagulation as above  Thrombocytopenia: Relatively stable. -Continue monitoring  Debility/physical deconditioning in the setting of acute illness -PT/OT recommended SNF  Nutritional Status Body mass index is 30.78 kg/m. Nutrition Problem: Increased nutrient needs Etiology: acute illness Signs/Symptoms: estimated needs Interventions: Ensure Enlive (each supplement provides 350kcal and 20 grams of protein),Prostat,Magic cup   Stage II sacral injury: Pressure Injury 12/29/20 Sacrum Medial Stage 2 -  Partial thickness loss of dermis presenting as a shallow open injury with a red, pink wound bed without slough. (Active)  12/29/20 1130  Location: Sacrum  Location Orientation: Medial  Staging: Stage 2 -  Partial thickness loss of dermis presenting as a shallow open injury with a red, pink wound bed without slough.  Wound Description (Comments):   Present on Admission: No   DVT prophylaxis:  Place and maintain sequential compression device Start: 12/31/20 1004  Code Status: Full code Family Communication: Patient and Therapist, sports. Level of care: Telemetry Status is: Inpatient  Remains inpatient appropriate because:Unsafe d/c plan, IV treatments appropriate due to intensity of illness or inability to take PO and Inpatient level of care appropriate due to severity of illness   Dispo:  Patient From: Home  Planned Disposition: [place]  Expected discharge date: 01/13/2021  Medically stable for discharge: No         Consultants:  General surgery Nephrology  PCCM-signed off Cardiology-signed off   Sch Meds:  Scheduled Meds: . (feeding supplement) PROSource Plus  30 mL Oral BID BM  . amLODipine  5 mg Oral Daily  . brimonidine  1 drop Both Eyes BID  . Chlorhexidine Gluconate Cloth  6 each Topical Daily  . cycloSPORINE  1 drop Both Eyes BID  . darbepoetin (ARANESP) injection - NON-DIALYSIS  100 mcg Subcutaneous Q Tue-1800  . dorzolamide  1  drop Both Eyes Daily  . feeding supplement  237 mL Oral BID BM  . furosemide  40 mg Oral BID  . insulin aspart  0-5 Units Subcutaneous QHS  . insulin aspart  0-9 Units Subcutaneous TID WC  . insulin glargine  5 Units Subcutaneous Daily  . latanoprost  1 drop Both Eyes QHS  . mouth rinse  15 mL Mouth Rinse BID  . metoprolol tartrate  25 mg Oral BID  . mycophenolate  180 mg Oral BID  . pantoprazole  40 mg Oral Daily  . predniSONE  7.5 mg Oral Q breakfast  . sodium hypochlorite   Irrigation BID  . tacrolimus  1 mg Oral BID  . valACYclovir  500 mg Oral BID  . cyanocobalamin  1,000 mcg Oral Daily   Continuous Infusions: . sodium chloride Stopped (01/09/21 1250)  . ferric gluconate (FERRLECIT/NULECIT) Test Dose    . heparin 1,100 Units/hr (01/12/21 0452)   PRN Meds:.sodium chloride, acetaminophen, fentaNYL (SUBLIMAZE) injection, hydrALAZINE, lip balm, naLOXone (NARCAN)  injection, [DISCONTINUED] ondansetron **OR** ondansetron (ZOFRAN) IV, oxyCODONE  Antimicrobials: Anti-infectives (From admission, onward)   Start     Dose/Rate Route Frequency Ordered Stop   12/31/20 2200  valACYclovir (VALTREX) tablet 500 mg        500 mg Oral 2 times daily 12/31/20 2100     12/31/20 1000  valACYclovir (VALTREX) compounded oral suspension 500 mg  Status:  Discontinued        500 mg Per Tube 2 times daily 12/30/20 2051 12/31/20 2100   12/29/20 1045  piperacillin-tazobactam (ZOSYN) IVPB 3.375 g        3.375 g 12.5 mL/hr over 240 Minutes Intravenous Every 8 hours 12/29/20 0956 01/04/21 0152   12/24/20 0800  ciprofloxacin (CIPRO) tablet 500 mg  Status:  Discontinued        500 mg Oral Daily with breakfast 12/23/20 1721 12/23/20 1737   12/23/20 2200  metroNIDAZOLE (FLAGYL) tablet 500 mg  Status:  Discontinued        500 mg Oral Every 8 hours 12/23/20 1721 12/23/20 1737   12/23/20 2200  amoxicillin-clavulanate (AUGMENTIN) 500-125 MG per tablet 500 mg  Status:  Discontinued        1 tablet Oral 2 times  daily 12/23/20 1739 12/29/20 0949   12/18/20 1100  Ampicillin-Sulbactam (UNASYN) 3 g in sodium chloride 0.9 % 100 mL IVPB  Status:  Discontinued        3 g 200 mL/hr over 30 Minutes Intravenous Every 12 hours 12/18/20 0729 12/23/20 1721   12/17/20 1400  Ampicillin-Sulbactam (UNASYN) 3 g in sodium chloride 0.9 % 100 mL IVPB  Status:  Discontinued        3 g 200 mL/hr over 30 Minutes Intravenous Every 6 hours 12/17/20 1004 12/18/20 0729   12/16/20 2200  ciprofloxacin (CIPRO) IVPB 400 mg  Status:  Discontinued        400 mg 200 mL/hr over 60 Minutes Intravenous Every 12 hours 12/16/20 1451 12/16/20 1734   12/16/20 2200  piperacillin-tazobactam (ZOSYN) IVPB 3.375  g  Status:  Discontinued        3.375 g 12.5 mL/hr over 240 Minutes Intravenous Every 8 hours 12/16/20 1734 12/17/20 0949   12/16/20 1800  metroNIDAZOLE (FLAGYL) IVPB 500 mg  Status:  Discontinued        500 mg 100 mL/hr over 60 Minutes Intravenous Every 8 hours 12/16/20 1451 12/16/20 1734   12/16/20 1515  valACYclovir (VALTREX) tablet 500 mg  Status:  Discontinued        500 mg Oral 2 times daily 12/16/20 1505 12/30/20 2048   12/16/20 0915  ciprofloxacin (CIPRO) IVPB 400 mg        400 mg 200 mL/hr over 60 Minutes Intravenous  Once 12/16/20 0912 12/16/20 1104   12/16/20 0915  metroNIDAZOLE (FLAGYL) IVPB 500 mg        500 mg 100 mL/hr over 60 Minutes Intravenous  Once 12/16/20 0912 12/16/20 1104       I have personally reviewed the following labs and images: CBC: Recent Labs  Lab 01/08/21 0322 01/09/21 0511 01/10/21 0438 01/11/21 0023 01/12/21 0250  WBC 8.4 6.8 4.4 3.1* 2.5*  HGB 7.4* 7.4* 9.5* 8.9* 8.1*  HCT 24.0* 24.1* 30.0* 28.1* 25.2*  MCV 104.8* 106.6* 102.0* 103.3* 102.9*  PLT 91* 103* 91* 122* 100*   BMP &GFR Recent Labs  Lab 01/08/21 0322 01/08/21 1021 01/09/21 0511 01/10/21 0438 01/11/21 0023 01/12/21 0250  NA 135 135 136 136 134* 131*  K 4.4 4.3 4.3 4.8 4.2 3.9  CL 100 99 101 102 98 96*  CO2 27 28  26 26 28 28   GLUCOSE 127* 167* 138* 141* 251* 189*  BUN 81* 73* 76* 72* 64* 54*  CREATININE 1.44* 1.45* 1.42* 1.19* 1.20* 1.03*  CALCIUM 9.3 9.1 9.0 9.0 8.9 8.7*  MG  --   --  1.7 1.6* 1.5* 1.8  PHOS 3.1  --  3.0 2.5 2.1* 1.8*   Estimated Creatinine Clearance: 51.4 mL/min (A) (by C-G formula based on SCr of 1.03 mg/dL (H)). Liver & Pancreas: Recent Labs  Lab 01/08/21 1021 01/09/21 0511 01/10/21 0438 01/11/21 0023 01/12/21 0250  AST 10*  --   --   --   --   ALT 8  --   --   --   --   ALKPHOS 62  --   --   --   --   BILITOT 1.5*  --   --   --   --   PROT 5.4*  --   --   --   --   ALBUMIN 2.3* 2.2* 2.2* 2.1* 1.9*   No results for input(s): LIPASE, AMYLASE in the last 168 hours. No results for input(s): AMMONIA in the last 168 hours. Diabetic: No results for input(s): HGBA1C in the last 72 hours. Recent Labs  Lab 01/11/21 0729 01/11/21 1223 01/11/21 1640 01/12/21 0007 01/12/21 0831  GLUCAP 170* 201* 244* 183* 157*   Cardiac Enzymes: No results for input(s): CKTOTAL, CKMB, CKMBINDEX, TROPONINI in the last 168 hours. No results for input(s): PROBNP in the last 8760 hours. Coagulation Profile: Recent Labs  Lab 01/09/21 1113  INR 1.2   Thyroid Function Tests: No results for input(s): TSH, T4TOTAL, FREET4, T3FREE, THYROIDAB in the last 72 hours. Lipid Profile: No results for input(s): CHOL, HDL, LDLCALC, TRIG, CHOLHDL, LDLDIRECT in the last 72 hours. Anemia Panel: No results for input(s): VITAMINB12, FOLATE, FERRITIN, TIBC, IRON, RETICCTPCT in the last 72 hours. Urine analysis:    Component Value Date/Time  COLORURINE YELLOW 12/24/2020 1700   APPEARANCEUR CLEAR 12/24/2020 1700   LABSPEC 1.015 12/24/2020 1700   PHURINE 5.0 12/24/2020 1700   GLUCOSEU 50 (A) 12/24/2020 1700   HGBUR NEGATIVE 12/24/2020 1700   BILIRUBINUR NEGATIVE 12/24/2020 1700   KETONESUR NEGATIVE 12/24/2020 1700   PROTEINUR NEGATIVE 12/24/2020 1700   UROBILINOGEN 0.2 05/15/2013 0733   NITRITE  NEGATIVE 12/24/2020 1700   LEUKOCYTESUR NEGATIVE 12/24/2020 1700   Sepsis Labs: Invalid input(s): PROCALCITONIN, [place]  Microbiology: No results found for this or any previous visit (from the past 240 hour(s)).  Radiology Studies: No results found.    Elham Fini T. Wauseon  If 7PM-7AM, please contact night-coverage [email or web address] 01/12/2021, 12:31 PM

## 2021-01-13 DIAGNOSIS — M10222 Drug-induced gout, left elbow: Secondary | ICD-10-CM

## 2021-01-13 DIAGNOSIS — I5031 Acute diastolic (congestive) heart failure: Secondary | ICD-10-CM | POA: Diagnosis not present

## 2021-01-13 DIAGNOSIS — K5732 Diverticulitis of large intestine without perforation or abscess without bleeding: Secondary | ICD-10-CM | POA: Diagnosis not present

## 2021-01-13 DIAGNOSIS — Z940 Kidney transplant status: Secondary | ICD-10-CM | POA: Diagnosis not present

## 2021-01-13 DIAGNOSIS — N179 Acute kidney failure, unspecified: Secondary | ICD-10-CM | POA: Diagnosis not present

## 2021-01-13 LAB — RENAL FUNCTION PANEL
Albumin: 2 g/dL — ABNORMAL LOW (ref 3.5–5.0)
Anion gap: 9 (ref 5–15)
BUN: 48 mg/dL — ABNORMAL HIGH (ref 8–23)
CO2: 25 mmol/L (ref 22–32)
Calcium: 8.8 mg/dL — ABNORMAL LOW (ref 8.9–10.3)
Chloride: 98 mmol/L (ref 98–111)
Creatinine, Ser: 0.97 mg/dL (ref 0.44–1.00)
GFR, Estimated: >60 mL/min (ref >60)
Glucose, Bld: 258 mg/dL — ABNORMAL HIGH (ref 70–99)
Phosphorus: 2 mg/dL — ABNORMAL LOW (ref 2.5–4.6)
Potassium: 4.1 mmol/L (ref 3.5–5.1)
Sodium: 132 mmol/L — ABNORMAL LOW (ref 135–145)

## 2021-01-13 LAB — MAGNESIUM: Magnesium: 1.6 mg/dL — ABNORMAL LOW (ref 1.7–2.4)

## 2021-01-13 LAB — GLUCOSE, CAPILLARY
Glucose-Capillary: 151 mg/dL — ABNORMAL HIGH (ref 70–99)
Glucose-Capillary: 144 mg/dL — ABNORMAL HIGH (ref 70–99)
Glucose-Capillary: 196 mg/dL — ABNORMAL HIGH (ref 70–99)
Glucose-Capillary: 190 mg/dL — ABNORMAL HIGH (ref 70–99)

## 2021-01-13 LAB — CBC
HCT: 27.9 % — ABNORMAL LOW (ref 36.0–46.0)
Hemoglobin: 8.8 g/dL — ABNORMAL LOW (ref 12.0–15.0)
MCH: 32.6 pg (ref 26.0–34.0)
MCHC: 31.5 g/dL (ref 30.0–36.0)
MCV: 103.3 fL — ABNORMAL HIGH (ref 80.0–100.0)
Platelets: 139 10*3/uL — ABNORMAL LOW (ref 150–400)
RBC: 2.7 MIL/uL — ABNORMAL LOW (ref 3.87–5.11)
RDW: 20.5 % — ABNORMAL HIGH (ref 11.5–15.5)
WBC: 2.6 10*3/uL — ABNORMAL LOW (ref 4.0–10.5)
nRBC: 0 % (ref 0.0–0.2)

## 2021-01-13 LAB — HEPARIN LEVEL (UNFRACTIONATED)
Heparin Unfractionated: 0.24 IU/mL — ABNORMAL LOW (ref 0.30–0.70)
Heparin Unfractionated: 0.32 IU/mL (ref 0.30–0.70)

## 2021-01-13 MED ORDER — APIXABAN 5 MG PO TABS
5.0000 mg | ORAL_TABLET | Freq: Two times a day (BID) | ORAL | Status: DC
  Administered 2021-01-13 – 2021-01-14 (×4): 5 mg via ORAL
  Filled 2021-01-13 (×4): qty 1

## 2021-01-13 MED ORDER — MAGNESIUM SULFATE 2 GM/50ML IV SOLN
2.0000 g | Freq: Once | INTRAVENOUS | Status: AC
  Administered 2021-01-13: 2 g via INTRAVENOUS
  Filled 2021-01-13: qty 50

## 2021-01-13 NOTE — Progress Notes (Signed)
PROGRESS NOTE  April HEIDEMAN PXT:[id number] DOB: 03-04-1956   PCP: [name], MD  Patient is from: Home  DOA: 12/16/2020 LOS: 5  Chief complaints: Abdominal pain and fever  Brief Narrative / Interim history: 65 year old F with PMH of severe persistent asthma, chronic hypoxic RF on 2 L as needed, ESRD s/p deceased renal transplant in 02-29-12 on immunomodulators and followed at [place], diastolic CHF, MGUS and HTN admitted for uncomplicated sigmoid diverticulitis and new onset A. fib with RVR.  She underwent TEE with DCCV on 2/6 but developed acute abdominal pain.  CT showed perforated diverticulitis.  She underwent laparotomy with Hartman's procedure but required MV and vasopressors postop.  She also stabilized in ICU and transferred back to [facility] service point 01/12/2021.  Postop complicated by slow return of bowel functions, recurrent acute on chronic kidney disease, volume overload and acute blood loss anemia.  Cardiology recommended anticoagulation at least for 30 days post DCCV, 02/06>>>. Cleared for discharge by nephrology and general surgery. Waiting on SNF bed  Subjective: Seen and examined earlier this morning. No major events overnight of this morning. Reports some pain in her right arm and abdomen. Reports intermittent nausea. She denies chest pain or dyspnea.  Objective: Vitals:   01/12/21 1357 01/12/21 2213 01/13/21 0500 01/13/21 0505  BP: (!) 127/52 129/61  139/81  Pulse: 73 74  70  Resp: 16 (!) 22    Temp:  98.1 F (36.7 C)  98.4 F (36.9 C)  TempSrc:  Oral  Oral  SpO2: 100% 100%  100%  Weight:   72.5 kg   Height:        Intake/Output Summary (Last 24 hours) at 01/13/2021 1133 Last data filed at 01/13/2021 0500 Gross per 24 hour  Intake 716.8 ml  Output 1200 ml  Net -483.2 ml   Filed Weights   01/09/21 0516 01/10/21 0549 01/13/21 0500  Weight: 75.1 kg 73.9 kg 72.5 kg    Examination:  GENERAL: No apparent distress. Appears tired. HEENT: MMM.  Vision and  hearing grossly intact.  NECK: Supple.  No apparent JVD.  RESP: 100% on 2 L. No IWOB.  Fair aeration bilaterally. CVS:  RRR. Heart sounds normal.  ABD/GI/GU: BS+. Abd soft, NTND. Dressing DCI. Normal output from ostomy. MSK/EXT:  Moves extremities. No apparent deformity. Dressing over LUE DCI. SKIN: Dressing over laparotomy and LUE wound DCI. NEURO: Awake, alert and oriented appropriately.  No apparent focal neuro deficit. PSYCH: Calm. Normal affect.  Procedures:  2/6-exploratory laparotomy, sigmoid colectomy and end colostomy 2/6-TEE with DCCV for A. Fib 2/6-2/8-ETT 2/14-I&D of left arm hematoma by CCS  Microbiology summarized: 2/2-COVID-19 PCR nonreactive.  Assessment & Plan: Acute on chronic hypoxemic respiratory failure due to septic shock in the setting of perforated diverticulitis/peritonitis-seems to have resolved. Stable on home 2 L. -ETT 2/6-2/8. -Completed antibiotic course with IV Zosyn on 2/12. -OOB/PT/OT/incentive spirometry  Septic shock due to perforated acute sigmoid diverticulitis s/p ex lap, sigmoid colectomy & end colostomy -Completed antibiotic course with IV Zosyn. -Ostomy care per surgery and wound care  A flutter with RVR s/p TEE and DCCV on 12/29/2020  -Continue metoprolol 25 mg twice daily -Cardiology recommendations:  -Uninterrupted AC for 30 days post DCCV-transition to Eliquis  -Elective ablation in the future.  -Outpatient follow-up with [facility] EP team  ABLA superimposed on ACD-surgical blood loss and hematoma to left arm. H&H stable. Recent Labs    01/05/21 0511 01/05/21 1202 01/06/21 1018 01/07/21 0252 01/08/21 0322 01/09/21 6270 01/10/21 0438 01/11/21  0023 01/12/21 0250 01/13/21 0049  HGB 7.4* 8.2* 8.6* 7.5* 7.4* 7.4* 9.5* 8.9* 8.1* 8.8*  -IV iron per nephrology -Monitor CBC  Acute on chronic diastolic CHF: TTE ordered 1/25 with LVEF of 70 to 75%, G2-DD.  Some evidence of fluid overload with bilateral lower extremity edema and  extravasation, Rt>Lt. She had about 1.3 L UOP/24 hours. Creatinine improving. -Continue oral Lasix 40 mg twice daily per nephrology -Monitor fluid status, renal functions and electrolytes  AKI on CKD-3A/azotemia in patient with Hx of ESRD s/p deceased donor transplant in 03/12/2012:  Followed at [facility].  AKI resolved. Recent Labs    01/05/21 0511 01/06/21 0400 01/07/21 0252 01/08/21 0322 01/08/21 1021 01/09/21 0511 01/10/21 0438 01/11/21 0023 01/12/21 0250 01/13/21 0049  BUN 77* 76* 74* 81* 73* 76* 72* 64* 54* 48*  CREATININE 1.84* 1.78* 1.45* 1.44* 1.45* 1.42* 1.19* 1.20* 1.03* 0.97  -Continue monitoring -Continue home Prograf, Myfortic and home prednisone  NIDDM-2 with renal disease and hyperglycemia Recent Labs  Lab 01/12/21 0831 01/12/21 1307 01/12/21 1707 01/12/21 2207/03/13 01/13/21 0809  GLUCAP 157* 227* 166* 181* 151*  -Continue sensitive scale SSI -Continue Lantus 5 units daily.  Left upper arm wound after hematoma evacuation-stable. -Appreciate help by general surgery.  Acute gout flareup in right elbow-no acute finding on x-ray reports limited steadily. Uric acid elevated. -Started p.o. colchicine  Hypernatremia/hyperkalemia: Resolved.  Hypomagnesemia: Mg 1.6. -Replenish and recheck.  Adrenal insufficiency: Likely in the setting of acute illness -On home prednisone.  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 TBI abnormal. -Continue low-dose amlodipine -Encourage warm hand wears  Suspected OSA/chronic COPD -Nightly CPAP while in-house -Needs outpatient sleep study. -Outpatient follow-up with pulmonology/Dr. Vaughan Browner is her primary pulmonologist.   Age-indeterminate nonocclusive DVT of right PV-may not need treatment for this but Rx for DVT given immobility.  VQ scan low probability for PE. -Anticoagulation as above  Thrombocytopenia: Improved. -Continue monitoring  Debility/physical deconditioning in the  setting of acute illness -PT/OT recommended SNF  Nutritional Status Body mass index is 30.2 kg/m. Nutrition Problem: Increased nutrient needs Etiology: acute illness Signs/Symptoms: estimated needs Interventions: Ensure Enlive (each supplement provides 350kcal and 20 grams of protein),Prostat,Magic cup   Stage II sacral injury: Pressure Injury 12/29/20 Sacrum Medial Stage 2 -  Partial thickness loss of dermis presenting as a shallow open injury with a red, pink wound bed without slough. (Active)  12/29/20 1130  Location: Sacrum  Location Orientation: Medial  Staging: Stage 2 -  Partial thickness loss of dermis presenting as a shallow open injury with a red, pink wound bed without slough.  Wound Description (Comments):   Present on Admission: No   DVT prophylaxis:  Place and maintain sequential compression device Start: 12/31/20 1004  Code Status: Full code Family Communication: Updated patient's daughter over the phone. Level of care: Telemetry Status is: Inpatient  Remains inpatient appropriate because:Unsafe d/c plan, IV treatments appropriate due to intensity of illness or inability to take PO and Inpatient level of care appropriate due to severity of illness   Dispo:  Patient From: Home  Planned Disposition: [place]  Expected discharge date: 01/13/2021  Medically stable for discharge: No         Consultants:  General surgery Nephrology-signed off PCCM-signed off Cardiology-signed off   Sch Meds:  Scheduled Meds: . (feeding supplement) PROSource Plus  30 mL Oral BID BM  . amLODipine  5 mg Oral Daily  . brimonidine  1 drop  Both Eyes BID  . Chlorhexidine Gluconate Cloth  6 each Topical Daily  . colchicine  0.6 mg Oral Daily  . cycloSPORINE  1 drop Both Eyes BID  . darbepoetin (ARANESP) injection - NON-DIALYSIS  100 mcg Subcutaneous Q Tue-1800  . dorzolamide  1 drop Both Eyes Daily  . feeding supplement  237 mL Oral BID BM  . furosemide  40  mg Oral BID  . insulin aspart  0-5 Units Subcutaneous QHS  . insulin aspart  0-9 Units Subcutaneous TID WC  . insulin glargine  5 Units Subcutaneous Daily  . latanoprost  1 drop Both Eyes QHS  . mouth rinse  15 mL Mouth Rinse BID  . metoprolol tartrate  25 mg Oral BID  . mycophenolate  180 mg Oral BID  . pantoprazole  40 mg Oral Daily  . predniSONE  7.5 mg Oral Q breakfast  . tacrolimus  1 mg Oral BID  . valACYclovir  500 mg Oral BID  . cyanocobalamin  1,000 mcg Oral Daily   Continuous Infusions: . sodium chloride Stopped (01/09/21 1250)  . ferric gluconate (FERRLECIT/NULECIT) Test Dose    . heparin 1,650 Units/hr (01/13/21 0219)   PRN Meds:.sodium chloride, acetaminophen, fentaNYL (SUBLIMAZE) injection, hydrALAZINE, lip balm, naLOXone (NARCAN)  injection, [DISCONTINUED] ondansetron **OR** ondansetron (ZOFRAN) IV, oxyCODONE  Antimicrobials: Anti-infectives (From admission, onward)   Start     Dose/Rate Route Frequency Ordered Stop   12/31/20 2200  valACYclovir (VALTREX) tablet 500 mg        500 mg Oral 2 times daily 12/31/20 2100     12/31/20 1000  valACYclovir (VALTREX) compounded oral suspension 500 mg  Status:  Discontinued        500 mg Per Tube 2 times daily 12/30/20 2051 12/31/20 2100   12/29/20 1045  piperacillin-tazobactam (ZOSYN) IVPB 3.375 g        3.375 g 12.5 mL/hr over 240 Minutes Intravenous Every 8 hours 12/29/20 0956 01/04/21 0152   12/24/20 0800  ciprofloxacin (CIPRO) tablet 500 mg  Status:  Discontinued        500 mg Oral Daily with breakfast 12/23/20 1721 12/23/20 1737   12/23/20 2200  metroNIDAZOLE (FLAGYL) tablet 500 mg  Status:  Discontinued        500 mg Oral Every 8 hours 12/23/20 1721 12/23/20 1737   12/23/20 2200  amoxicillin-clavulanate (AUGMENTIN) 500-125 MG per tablet 500 mg  Status:  Discontinued        1 tablet Oral 2 times daily 12/23/20 1739 12/29/20 0949   12/18/20 1100  Ampicillin-Sulbactam (UNASYN) 3 g in sodium chloride 0.9 % 100 mL IVPB   Status:  Discontinued        3 g 200 mL/hr over 30 Minutes Intravenous Every 12 hours 12/18/20 0729 12/23/20 1721   12/17/20 1400  Ampicillin-Sulbactam (UNASYN) 3 g in sodium chloride 0.9 % 100 mL IVPB  Status:  Discontinued        3 g 200 mL/hr over 30 Minutes Intravenous Every 6 hours 12/17/20 1004 12/18/20 0729   12/16/20 2200  ciprofloxacin (CIPRO) IVPB 400 mg  Status:  Discontinued        400 mg 200 mL/hr over 60 Minutes Intravenous Every 12 hours 12/16/20 1451 12/16/20 1734   12/16/20 2200  piperacillin-tazobactam (ZOSYN) IVPB 3.375 g  Status:  Discontinued        3.375 g 12.5 mL/hr over 240 Minutes Intravenous Every 8 hours 12/16/20 1734 12/17/20 0949   12/16/20 1800  metroNIDAZOLE (FLAGYL) IVPB 500  mg  Status:  Discontinued        500 mg 100 mL/hr over 60 Minutes Intravenous Every 8 hours 12/16/20 1451 12/16/20 1734   12/16/20 1515  valACYclovir (VALTREX) tablet 500 mg  Status:  Discontinued        500 mg Oral 2 times daily 12/16/20 1505 12/30/20 2048   12/16/20 0915  ciprofloxacin (CIPRO) IVPB 400 mg        400 mg 200 mL/hr over 60 Minutes Intravenous  Once 12/16/20 0912 12/16/20 1104   12/16/20 0915  metroNIDAZOLE (FLAGYL) IVPB 500 mg        500 mg 100 mL/hr over 60 Minutes Intravenous  Once 12/16/20 0912 12/16/20 1104       I have personally reviewed the following labs and images: CBC: Recent Labs  Lab 01/09/21 0511 01/10/21 0438 01/11/21 0023 01/12/21 0250 01/13/21 0049  WBC 6.8 4.4 3.1* 2.5* 2.6*  HGB 7.4* 9.5* 8.9* 8.1* 8.8*  HCT 24.1* 30.0* 28.1* 25.2* 27.9*  MCV 106.6* 102.0* 103.3* 102.9* 103.3*  PLT 103* 91* 122* 100* 139*   BMP &GFR Recent Labs  Lab 01/09/21 0511 01/10/21 0438 01/11/21 0023 01/12/21 0250 01/13/21 0049  NA 136 136 134* 131* 132*  K 4.3 4.8 4.2 3.9 4.1  CL 101 102 98 96* 98  CO2 26 26 28 28 25   GLUCOSE 138* 141* 251* 189* 258*  BUN 76* 72* 64* 54* 48*  CREATININE 1.42* 1.19* 1.20* 1.03* 0.97  CALCIUM 9.0 9.0 8.9 8.7* 8.8*  MG  1.7 1.6* 1.5* 1.8 1.6*  PHOS 3.0 2.5 2.1* 1.8* 2.0*   Estimated Creatinine Clearance: 54.1 mL/min (by C-G formula based on SCr of 0.97 mg/dL). Liver & Pancreas: Recent Labs  Lab 01/08/21 1021 01/09/21 0511 01/10/21 0438 01/11/21 0023 01/12/21 0250 01/13/21 0049  AST 10*  --   --   --   --   --   ALT 8  --   --   --   --   --   ALKPHOS 62  --   --   --   --   --   BILITOT 1.5*  --   --   --   --   --   PROT 5.4*  --   --   --   --   --   ALBUMIN 2.3* 2.2* 2.2* 2.1* 1.9* 2.0*   No results for input(s): LIPASE, AMYLASE in the last 168 hours. No results for input(s): AMMONIA in the last 168 hours. Diabetic: No results for input(s): HGBA1C in the last 72 hours. Recent Labs  Lab 01/12/21 0831 01/12/21 1307 01/12/21 1707 01/12/21 2208 01/13/21 0809  GLUCAP 157* 227* 166* 181* 151*   Cardiac Enzymes: No results for input(s): CKTOTAL, CKMB, CKMBINDEX, TROPONINI in the last 168 hours. No results for input(s): PROBNP in the last 8760 hours. Coagulation Profile: Recent Labs  Lab 01/09/21 1113  INR 1.2   Thyroid Function Tests: No results for input(s): TSH, T4TOTAL, FREET4, T3FREE, THYROIDAB in the last 72 hours. Lipid Profile: No results for input(s): CHOL, HDL, LDLCALC, TRIG, CHOLHDL, LDLDIRECT in the last 72 hours. Anemia Panel: No results for input(s): VITAMINB12, FOLATE, FERRITIN, TIBC, IRON, RETICCTPCT in the last 72 hours. Urine analysis:    Component Value Date/Time   COLORURINE YELLOW 12/24/2020 1700   APPEARANCEUR CLEAR 12/24/2020 1700   LABSPEC 1.015 12/24/2020 1700   PHURINE 5.0 12/24/2020 1700   GLUCOSEU 50 (A) 12/24/2020 1700   HGBUR NEGATIVE 12/24/2020 1700  BILIRUBINUR NEGATIVE 12/24/2020 1700   KETONESUR NEGATIVE 12/24/2020 1700   PROTEINUR NEGATIVE 12/24/2020 1700   UROBILINOGEN 0.2 05/15/2013 0733   NITRITE NEGATIVE 12/24/2020 1700   LEUKOCYTESUR NEGATIVE 12/24/2020 1700   Sepsis Labs: Invalid input(s): PROCALCITONIN,  [place]  Microbiology: No results found for this or any previous visit (from the past 240 hour(s)).  Radiology Studies: DG Elbow 2 Views Right  Result Date: 01/12/2021 CLINICAL DATA:  Right elbow pain. EXAM: RIGHT ELBOW - 2 VIEW COMPARISON:  None. FINDINGS: No gross fracture or dislocation on this portable study. Exam is limited by positioning and assessment for joint effusion is limited. No worrisome lytic or sclerotic osseous abnormality. IMPRESSION: Limited study due to positioning. No gross fracture or dislocation. Nondisplaced radial neck fracture a concern. Repeat dedicated elbow films recommended when the patient is better able to participate with positioning. Electronically Signed   By: Misty Stanley M.D.   On: 01/12/2021 13:47      Natisha Trzcinski T. Eva  If 7PM-7AM, please contact night-coverage [email or web address] 01/13/2021, 11:33 AM

## 2021-01-13 NOTE — Progress Notes (Signed)
ANTICOAGULATION CONSULT NOTE - Follow Up Consult  Pharmacy Consult for Heparin >> apixaban Indication: hx atrial fibrillation  Allergies  Allergen Reactions  . Infed [Iron Dextran] Other (See Comments)    Chest tightness  . Pentamidine Itching, Shortness Of Breath and Swelling  . Budesonide-Formoterol Fumarate     Other reaction(s): hoarseness  . Bupropion     Other reaction(s): exacerbated depression  . Crestor [Rosuvastatin]     Other reaction(s): body aches, swelling  . Duloxetine Hcl     Other reaction(s): GI SE, weepy, worsening fatigue, foggy  . Erythromycin [Erythromycin] Other (See Comments)    Mouth Ulcers  . Iohexol Other (See Comments)    Per patient "she has had a kidney transplant and should never have contrast"  . Oxycodone Nausea Only and Nausea And Vomiting  . Pravastatin Sodium     Other reaction(s): myalgias  . Erythromycin Rash    Causes breakout in mouth  . Ultram [Tramadol Hcl] Anxiety    Patient Measurements: Height: 5\' 1"  (154.9 cm) Weight: 72.5 kg (159 lb 13.3 oz) IBW/kg (Calculated) : 47.8 Heparin Dosing Weight: 65 kg  Vital Signs: Temp: 98.4 F (36.9 C) (02/21 0505) Temp Source: Oral (02/21 0505) BP: 139/81 (02/21 0505) Pulse Rate: 70 (02/21 0505)  Labs: Recent Labs    01/11/21 0023 01/11/21 0927 01/12/21 0250 01/12/21 1028 01/12/21 1720 01/13/21 0049 01/13/21 1003  HGB 8.9*  --  8.1*  --   --  8.8*  --   HCT 28.1*  --  25.2*  --   --  27.9*  --   PLT 122*  --  100*  --   --  139*  --   HEPARINUNFRC <0.10*   < > <0.10*   < > 0.13* 0.24* 0.32  CREATININE 1.20*  --  1.03*  --   --  0.97  --    < > = values in this interval not displayed.    Estimated Creatinine Clearance: 54.1 mL/min (by C-G formula based on SCr of 0.97 mg/dL).   Medications: No anticoagulants PTA  Assessment: 65 yo female started on apixaban inpatient; last dose of apixaban was on 2/5. Pt is s/p TEE/cardioversion on 12/26/2020.  Presented with perforated acute  sigmoid diverticulitis and underwent Hartman's on 2/6: had oozing from wound site post-op and anticoagulation was held post-op until IV UFH resumed on 2/11. Was cleared to resume AC at that time by CCS.  Significant Events: - Per Cards on 2/14, If no bleeding the next 24 hours recommend to restart Eliquis as recommended by surgery. Would prefer she had 4 weeks of uninterrupted anticoagulation after cardioversion.  - 2/17: heparin drip stopped d/t oozing at midline incision and extremity hematoma. Transfused - 2/18: per Dr. Cyndia Skeeters, ok to resume heparin drip today after iron IV dose - 2/21: Pharmacy consulted to transition anticoagulation from IV UFH back to apixaban  Today, 01/13/2021:  HL = 0.32 is therapeutic on heparin infusion of 1650 units/hr  CBC: Hgb (8.8) low but stable; Plt (139) low but improving  Confirmed with RN that heparin infusing at correct rate. No signs of bleeding. No reported worsening of hematoma  Goal of Therapy:  Heparin level 0.3-0.5 units/ml Monitor platelets by anticoagulation protocol: Yes   Plan:   Discontinue heparin drip  Initiate apixaban 5 mg PO BID  Check CBC with AM labs tomorrow  Monitor for signs of bleeding  Apixaban education/coupon completed on 12/26/20  [name], PharmD 01/13/21 11:25 AM

## 2021-01-13 NOTE — Consult Note (Addendum)
[place] Nurse ostomy follow up Stoma type/location: LLQ colostomy Stomal assessment/size: oval, 1 and 1/2 inches x 1 and 5/8 inches red, raised, lumen in center Peristomal assessment: intact Treatment options for stomal/peristomal skin: skin barrier ring Output: soft light brown stool Ostomy pouching: 2pc. 2 and 3/4 inch pouching system with skin barrier ring Education provided: None today. Patient is ot engaged with learning ostomy care. Will require total ostomy care assistance post discharge.  Enrolled patient in [place] Start Discharge program: Yes, previously  Supplies at bedside: 3 skin barriers, 3 skin barrier rings, 3 pouches.  Patient up in chair today.  A pressure redistribution chair cushion is added for pressure injury prevention today.  [facility] nursing team will not follow, but will remain available to this patient, the nursing and medical teams.  Please re-consult if needed. Thanks, Maudie Flakes, MSN, RN, [place], Arther Abbott  Pager# [phone]

## 2021-01-13 NOTE — Progress Notes (Signed)
ANTICOAGULATION CONSULT NOTE - Follow Up Consult  Pharmacy Consult for Heparin Indication: hx atrial fibrillation  Allergies  Allergen Reactions  . Infed [Iron Dextran] Other (See Comments)    Chest tightness  . Pentamidine Itching, Shortness Of Breath and Swelling  . Budesonide-Formoterol Fumarate     Other reaction(s): hoarseness  . Bupropion     Other reaction(s): exacerbated depression  . Crestor [Rosuvastatin]     Other reaction(s): body aches, swelling  . Duloxetine Hcl     Other reaction(s): GI SE, weepy, worsening fatigue, foggy  . Erythromycin [Erythromycin] Other (See Comments)    Mouth Ulcers  . Iohexol Other (See Comments)    Per patient "she has had a kidney transplant and should never have contrast"  . Oxycodone Nausea Only and Nausea And Vomiting  . Pravastatin Sodium     Other reaction(s): myalgias  . Erythromycin Rash    Causes breakout in mouth  . Ultram [Tramadol Hcl] Anxiety    Patient Measurements: Height: 5\' 1"  (154.9 cm) Weight: 73.9 kg (162 lb 14.7 oz) IBW/kg (Calculated) : 47.8 Heparin Dosing Weight: 65 kg  Vital Signs: Temp: 98.1 F (36.7 C) (02/20 2213) Temp Source: Oral (02/20 2213) BP: 129/61 (02/20 2213) Pulse Rate: 74 (02/20 2213)  Labs: Recent Labs    01/10/21 0438 01/11/21 0023 01/11/21 0927 01/12/21 0250 01/12/21 1028 01/12/21 1720 01/13/21 0049  HGB 9.5* 8.9*  --  8.1*  --   --  8.8*  HCT 30.0* 28.1*  --  25.2*  --   --  27.9*  PLT 91* 122*  --  100*  --   --  139*  HEPARINUNFRC  --  <0.10*   < > <0.10* 0.11* 0.13* 0.24*  CREATININE 1.19* 1.20*  --  1.03*  --   --   --    < > = values in this interval not displayed.    Estimated Creatinine Clearance: 51.4 mL/min (A) (by C-G formula based on SCr of 1.03 mg/dL (H)).   Medications:  Scheduled:  . (feeding supplement) PROSource Plus  30 mL Oral BID BM  . amLODipine  5 mg Oral Daily  . brimonidine  1 drop Both Eyes BID  . Chlorhexidine Gluconate Cloth  6 each Topical  Daily  . colchicine  0.6 mg Oral Daily  . cycloSPORINE  1 drop Both Eyes BID  . darbepoetin (ARANESP) injection - NON-DIALYSIS  100 mcg Subcutaneous Q Tue-1800  . dorzolamide  1 drop Both Eyes Daily  . feeding supplement  237 mL Oral BID BM  . furosemide  40 mg Oral BID  . insulin aspart  0-5 Units Subcutaneous QHS  . insulin aspart  0-9 Units Subcutaneous TID WC  . insulin glargine  5 Units Subcutaneous Daily  . latanoprost  1 drop Both Eyes QHS  . mouth rinse  15 mL Mouth Rinse BID  . metoprolol tartrate  25 mg Oral BID  . mycophenolate  180 mg Oral BID  . pantoprazole  40 mg Oral Daily  . predniSONE  7.5 mg Oral Q breakfast  . sodium hypochlorite   Irrigation BID  . tacrolimus  1 mg Oral BID  . valACYclovir  500 mg Oral BID  . cyanocobalamin  1,000 mcg Oral Daily   Infusions:  . sodium chloride Stopped (01/09/21 1250)  . ferric gluconate (FERRLECIT/NULECIT) Test Dose    . heparin 1,500 Units/hr (01/12/21 1932)    Assessment: 65 yo female on apixaban PTA and last inpatient dose of apixaban was  on 2/5, s/p TEE/cardioversion on 12/26/2020. Presented with perforated acute sigmoid diverticulitis s/p Hartman's on 2/6 has had oozing from wound site and anticoagulation has not been restarted as a result until 2/11 when CCS stated could be restarted if Hgb remained stable (8.9 > 8.8). Md ordered to start IV heparin per Rx.  - Per Cards on 2/14, If no bleeding the next 24 hours recommend to restart Eliquis as recommended by surgery. Would prefer she had 4 weeks of uninterrupted anticoagulation after cardioversion.   - 2/17: heparin drip stopped d/t oozing at midline incision and extremity hematoma - 2/18: per Dr. Cyndia Skeeters, ok to resume heparin drip today after iron IV dose  Today, 01/13/2021: - heparin level remains sub-therapeutic but increased to 0.24 with rate increased to 1500 units/hr - Per pt's RN,  incision site looks good today, hematoma on arm remains stable, and no issues with IV  line reported. - hgb 8.8 plts 139K   Goal of Therapy:  Heparin level 0.3-0.5 units/ml Monitor platelets by anticoagulation protocol: Yes   Plan:  - increase heparin drip to 1300 units/hr - check 6 hr heparin level - monitor closely for bleeding  Leone Haven, PharmD 01/13/2021 1:38 AM

## 2021-01-13 NOTE — TOC Progression Note (Addendum)
Transition of Care [facility]) - Progression Note    Patient Details  Name: April [name] MRN: [id number] Date of Birth: [birth year]  Transition of Care [facility]) CM/SW Contact  [name] Ludwig, [place] Phone Number: 01/13/2021, 5:17 PM  Clinical Narrative:     CSW spoke to patient's daughter to discuss if patient would be agreeable to SNF for short term rehab.  Per patient's daughter, patient initially said no, but has reconsidered going to SNF.  Per patient's daughter it will depend on which facility offers her a bed.  CSW explained to her it will also depend on what is available.  Per patient's daughter they have had some not good experiences at SNFs.  CSW informed her that there are several in the area, and daughter is agreeable to SNF in [facility].  CSW.  CSW awaiting for bed offers, and will need insurance authorization before she is able to discharge to SNF.  Patient's clinicals have been faxed to patient's insurance company awaiting insurance authorization reference number [id number].  Expected Discharge Plan: Home/Self Care Barriers to Discharge: Continued Medical Work up  Expected Discharge Plan and Services Expected Discharge Plan: Home/Self Care   Discharge Planning Services: CM Consult   Living arrangements for the past 2 months: Single Family Home                                       Social Determinants of Health (SDOH) Interventions    Readmission Risk Interventions Readmission Risk Prevention Plan 05/15/2020 04/19/2020 02/09/2020  Transportation Screening Complete Complete Complete  PCP or Specialist Appt within 3-5 Days - - -  Not Complete comments - - -  HRI or [place] or Home Care Consult comments - - -  Social Work Consult for [place] Planning/Counseling - - -  SW consult not completed comments - - -  Palliative Care Screening - - -  Medication Review ([place]) Referral to Pharmacy Complete Referral to Pharmacy  PCP or  Specialist appointment within 3-5 days of discharge Complete Complete Complete  HRI or Home Care Consult Complete Complete Complete  SW Recovery Care/Counseling Consult Complete Complete -  Palliative Care Screening Not Applicable Not Applicable Complete  Skilled Nursing Facility Complete Complete Patient Refused  Some recent data might be hidden

## 2021-01-13 NOTE — NC FL2 (Signed)
[place] LEVEL OF CARE SCREENING TOOL     IDENTIFICATION  Patient Name: April [name] Birthdate: November 20, 1956 Sex: female Admission Date (Current Location): 12/16/2020  [facility] and [place] Number:  Herbalist and Address:  [facility],  [place], [place]      Provider Number: [id number]  Attending Physician Name and Address:  Mercy Riding, MD  Relative Name and Phone Number:  [facility] Daughter [phone]  [phone]    Current Level of Care: Hospital Recommended Level of Care: [place] Prior Approval Number:    Date Approved/Denied:   PASRR Number: [id number]  Discharge Plan: SNF    Current Diagnoses: Patient Active Problem List   Diagnosis Date Noted  . Difficult intravenous access   . Bilious vomiting with nausea   . Sigmoid diverticulitis 12/16/2020  . Acute CHF (congestive heart failure) ([place]) 12/16/2020  . Fever of unknown origin 10/24/2020  . Fever of unknown origin (FUO) 10/23/2020  . Lower GI bleeding 09/25/2020  . Diarrhea 09/10/2020  . Acute renal failure superimposed on stage 3b chronic kidney disease ([place]) 09/08/2020  . Acute diverticulitis 09/04/2020  . SIRS (systemic inflammatory response syndrome) ([place]) 07/02/2020  . Chronic kidney disease, stage 3a ([place])   . Kappa light chain disease ([place]) 06/19/2020  . History of immunosuppressive therapy 05/27/2020  . FUO (fever of unknown origin) 05/15/2020  . Immunocompromised state ([place])   . Decubitus ulcer of coccyx, unstageable ([place]) 05/01/2020  . Pericardial effusion 04/16/2020  . Chronic respiratory failure with hypoxia ([place]) 04/16/2020  . Proteinuria 04/16/2020  . Pressure injury of skin of buttock 04/08/2020  . COPD (chronic obstructive pulmonary disease) ([place]) 04/07/2020  . SVT (supraventricular tachycardia) ([place])   . Anemia of chronic disease   . Palliative care encounter   . Goals of care, counseling/discussion    . Essential hypertension 02/08/2020  . Glaucoma 02/08/2020  . Sepsis ([place]) 01/31/2020  . SOB (shortness of breath)   . Fracture of left superior pubic ramus (HCC)   . Pain   . Anxiety state   . Left displaced femoral neck fracture ([place]) 12/15/2019  . Status post total hip replacement, left   . Post-op pain   . Polycystic kidney   . Closed left hip fracture ([place]) 12/07/2019  . Left wrist fracture 12/07/2019  . Medication management 09/22/2019  . Physical deconditioning 09/22/2019  . Acute respiratory failure with hypoxia ([place]) 04/19/2019  . Acute on chronic diastolic CHF (congestive heart failure) ([place]) 04/19/2019  . Bilateral closed proximal tibial fracture 12/21/2018  . Asthma, chronic, unspecified asthma severity, with acute exacerbation 10/03/2018  . Chronic diastolic CHF (congestive heart failure) ([place]) 10/01/2017  . Asthma, mild intermittent 07/15/2016  . GERD without esophagitis 07/15/2016  . Renal transplant recipient 07/15/2016  . Hyperlipidemia 07/15/2016  . Depression 07/15/2016  . Bronchitis, mucopurulent recurrent ([place]) 08/08/2014  . Chronic cough 07/24/2014  . End stage renal disease ([place]) 03/23/2012  . 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, implant, and graft 03/23/2012    Orientation RESPIRATION BLADDER Height & Weight     Self,Time,Situation,Place  O2 (3L) Incontinent Weight: 159 lb 13.3 oz (72.5 kg) Height:  5\' 1"  (154.9 cm)  BEHAVIORAL SYMPTOMS/MOOD NEUROLOGICAL BOWEL NUTRITION STATUS      Colostomy Diet (Carb Modified)  AMBULATORY STATUS COMMUNICATION OF NEEDS Skin   Limited Assist Verbally PU Stage and Appropriate Care   PU Stage 2 Dressing:  (Every 3 days)  Personal Care Assistance Level of Assistance  Bathing,Dressing,Feeding Bathing Assistance: Limited assistance Feeding assistance: Limited assistance Dressing Assistance: Limited assistance     Functional Limitations Info  Sight,Speech,Hearing Sight Info:  Adequate Hearing Info: Adequate Speech Info: Adequate    SPECIAL CARE FACTORS FREQUENCY  PT (By licensed PT),OT (By licensed OT)     PT Frequency: Minimum 5x a week OT Frequency: Minimum 5x a week            Contractures Contractures Info: Not present    Additional Factors Info  Code Status,Allergies,Insulin Sliding Scale,Isolation Precautions Code Status Info: Full Code Allergies Info: Infed, Pentamidine   Budesonide-formoterol Fumarate   Bupropion   Crestor, Duloxetine Hcl   Erythromycin, Iohexol   Oxycodone   Pravastatin Sodium   Erythromycin   Ultram   Insulin Sliding Scale Info: insulin aspart (novoLOG) injection 0-9 Units 3x a day with meals Isolation Precautions Info: Contact precautions for OMDRO     Current Medications (01/13/2021):  This is the current hospital active medication list Current Facility-Administered Medications  Medication Dose Route Frequency Provider Last Rate Last Admin  . (feeding supplement) PROSource Plus liquid 30 mL  30 mL Oral BID BM Hongalgi, Lenis Dickinson, MD   30 mL at 01/13/21 0842  . 0.9 %  sodium chloride infusion   Intravenous PRN Modena Jansky, MD   Stopped at 01/09/21 1250  . acetaminophen (TYLENOL) tablet 650 mg  650 mg Oral Q6H PRN Mannam, Praveen, MD   650 mg at 01/13/21 0837  . amLODipine (NORVASC) tablet 5 mg  5 mg Oral Daily Modena Jansky, MD   5 mg at 01/13/21 0835  . apixaban (ELIQUIS) tablet 5 mg  5 mg Oral BID [name], RPH   5 mg at 01/13/21 1318  . brimonidine (ALPHAGAN) 0.15 % ophthalmic solution 1 drop  1 drop Both Eyes BID Coralie Keens, MD   1 drop at 01/13/21 0840  . Chlorhexidine Gluconate Cloth 2 % PADS 6 each  6 each Topical Daily Coralie Keens, MD   6 each at 01/09/21 [phone]  . colchicine tablet 0.6 mg  0.6 mg Oral Daily Wendee Beavers T, MD   0.6 mg at 01/13/21 0835  . cycloSPORINE (RESTASIS) 0.05 % ophthalmic emulsion 1 drop  1 drop Both Eyes BID Coralie Keens, MD   1 drop at 01/13/21 [phone]  .  Darbepoetin Alfa (ARANESP) injection 100 mcg  100 mcg Subcutaneous Q Tue-1800 Elmarie Shiley, MD   100 mcg at 01/07/21 1802  . dorzolamide (TRUSOPT) 2 % ophthalmic solution 1 drop  1 drop Both Eyes Daily Coralie Keens, MD   1 drop at 01/13/21 0840  . feeding supplement (ENSURE ENLIVE / ENSURE PLUS) liquid 237 mL  237 mL Oral BID BM Gonfa, Taye T, MD   237 mL at 01/13/21 0840  . fentaNYL (SUBLIMAZE) injection 25 mcg  25 mcg Intravenous Q4H PRN Magdalen Spatz, NP   25 mcg at 01/10/21 0819  . ferric gluconate (NULECIT) 25 mg in sodium chloride 0.9 % 50 mL IVPB  25 mg Intravenous Once Elmarie Shiley, MD      . furosemide (LASIX) tablet 40 mg  40 mg Oral BID Elmarie Shiley, MD   40 mg at 01/13/21 0017  . hydrALAZINE (APRESOLINE) injection 10 mg  10 mg Intravenous Q4H PRN Anders Simmonds, MD   10 mg at 01/02/21 4944  . insulin aspart (novoLOG) injection 0-5 Units  0-5 Units Subcutaneous QHS Hongalgi, Lenis Dickinson, MD  2 Units at 01/10/21 2106  . insulin aspart (novoLOG) injection 0-9 Units  0-9 Units Subcutaneous TID WC Modena Jansky, MD   1 Units at 01/13/21 1319  . insulin glargine (LANTUS) injection 5 Units  5 Units Subcutaneous Daily Mercy Riding, MD   5 Units at 01/13/21 [phone]  . latanoprost (XALATAN) 0.005 % ophthalmic solution 1 drop  1 drop Both Eyes QHS Coralie Keens, MD   1 drop at 01/12/21 2238  . lip balm (CARMEX) ointment 1 application  1 application Topical PRN Mannam, Praveen, MD      . MEDLINE mouth rinse  15 mL Mouth Rinse BID Mannam, Praveen, MD   15 mL at 01/13/21 1320  . metoprolol tartrate (LOPRESSOR) tablet 25 mg  25 mg Oral BID Donato Heinz, MD   25 mg at 01/13/21 3817  . mycophenolate (MYFORTIC) EC tablet 180 mg  180 mg Oral BID Mannam, Praveen, MD   180 mg at 01/13/21 0838  . naloxone [facility]) injection 0.4 mg  0.4 mg Intravenous PRN Coralie Keens, MD      . ondansetron [facility]) injection 4 mg  4 mg Intravenous Q6H PRN Coralie Keens, MD   4 mg at 01/13/21 0840  .  oxyCODONE (Oxy IR/ROXICODONE) immediate release tablet 5 mg  5 mg Oral Q6H PRN Mannam, Praveen, MD   5 mg at 01/12/21 0906  . pantoprazole (PROTONIX) EC tablet 40 mg  40 mg Oral Daily Modena Jansky, MD   40 mg at 01/13/21 7116  . predniSONE (DELTASONE) tablet 7.5 mg  7.5 mg Oral Q breakfast Modena Jansky, MD   7.5 mg at 01/13/21 0836  . tacrolimus (PROGRAF) capsule 1 mg  1 mg Oral BID Mannam, Praveen, MD   1 mg at 01/13/21 0838  . valACYclovir (VALTREX) tablet 500 mg  500 mg Oral BID Mannam, Praveen, MD   500 mg at 01/13/21 0837  . vitamin B-12 (CYANOCOBALAMIN) tablet 1,000 mcg  1,000 mcg Oral Daily Mannam, Praveen, MD   1,000 mcg at 01/13/21 5790     Discharge Medications: Please see discharge summary for a list of discharge medications.  Relevant Imaging Results:  Relevant Lab Results:   Additional Information SSN [id number]  [name] Ludwig, LCSW

## 2021-01-13 NOTE — Progress Notes (Signed)
[facility] Progress Note  15 Days Post-Op  Subjective: CC-  Slept well last night. Abdominal pain about the same. She is not taking any narcotics. Continues to have intermittent nausea which is helped by zofran. No emesis. Appetite has improved. She drank Ensure x2 yesterday and thinks that she ate 25-50% of her meals. Ostomy functioning.  H/h stable on IV heparin, last blood transfusion was on 2/17.  Objective: Vital signs in last 24 hours: Temp:  [98.1 F (36.7 C)-98.4 F (36.9 C)] 98.4 F (36.9 C) (02/21 0505) Pulse Rate:  [70-75] 70 (02/21 0505) Resp:  [16-22] 22 (02/20 2213) BP: (127-139)/(52-81) 139/81 (02/21 0505) SpO2:  [100 %] 100 % (02/21 0505) Weight:  [72.5 kg] 72.5 kg (02/21 0500) Last BM Date: 01/13/21  Intake/Output from previous day: 02/20 0701 - 02/21 0700 In: 956.8 [P.O.:480; I.V.:476.8] Out: 2150 [Urine:1700; Stool:450] Intake/Output this shift: No intake/output data recorded.  PE: Gen: Alert, NAD, frail Pulm:rate and effort normal. On 2L supplemental O2 via [place] TKW:IOXB, nondistended, tender over incisionotherwise nontender, ostomyviable with liquid brown stool and air in pouch,open midline woundsome visible sutures and fibrinous exudate at the base- fascia intact but some areas of necrosis, no longer has green drainage on dressing  Lab Results:  Recent Labs    01/12/21 0250 01/13/21 0049  WBC 2.5* 2.6*  HGB 8.1* 8.8*  HCT 25.2* 27.9*  PLT 100* 139*   BMET Recent Labs    01/12/21 0250 01/13/21 0049  NA 131* 132*  K 3.9 4.1  CL 96* 98  CO2 28 25  GLUCOSE 189* 258*  BUN 54* 48*  CREATININE 1.03* 0.97  CALCIUM 8.7* 8.8*   PT/INR No results for input(s): LABPROT, INR in the last 72 hours. CMP     Component Value Date/Time   NA 132 (L) 01/13/2021 0049   K 4.1 01/13/2021 0049   CL 98 01/13/2021 0049   CO2 25 01/13/2021 0049   GLUCOSE 258 (H) 01/13/2021 0049   BUN 48 (H) 01/13/2021 0049   CREATININE 0.97  01/13/2021 0049   CREATININE 1.28 (H) 11/04/2020 0952   CALCIUM 8.8 (L) 01/13/2021 0049   CALCIUM 10.8 (H) 10/13/2011 1405   PROT 5.4 (L) 01/08/2021 1021   ALBUMIN 2.0 (L) 01/13/2021 0049   AST 10 (L) 01/08/2021 1021   AST 13 (L) 11/04/2020 0952   ALT 8 01/08/2021 1021   ALT 12 11/04/2020 0952   ALKPHOS 62 01/08/2021 1021   BILITOT 1.5 (H) 01/08/2021 1021   BILITOT 0.6 11/04/2020 0952   GFRNONAA >60 01/13/2021 0049   GFRNONAA 47 (L) 11/04/2020 0952   GFRAA 51 (L) 08/05/2020 1523   Lipase     Component Value Date/Time   LIPASE 49 12/29/2020 0822       Studies/Results: DG Elbow 2 Views Right  Result Date: 01/12/2021 CLINICAL DATA:  Right elbow pain. EXAM: RIGHT ELBOW - 2 VIEW COMPARISON:  None. FINDINGS: No gross fracture or dislocation on this portable study. Exam is limited by positioning and assessment for joint effusion is limited. No worrisome lytic or sclerotic osseous abnormality. IMPRESSION: Limited study due to positioning. No gross fracture or dislocation. Nondisplaced radial neck fracture a concern. Repeat dedicated elbow films recommended when the patient is better able to participate with positioning. Electronically Signed   By: Misty Stanley M.D.   On: 01/12/2021 13:47    Anti-infectives: Anti-infectives (From admission, onward)   Start     Dose/Rate Route Frequency Ordered Stop   12/31/20 2200  valACYclovir (VALTREX) tablet 500 mg        500 mg Oral 2 times daily 12/31/20 2100     12/31/20 1000  valACYclovir (VALTREX) compounded oral suspension 500 mg  Status:  Discontinued        500 mg Per Tube 2 times daily 12/30/20 2051 12/31/20 2100   12/29/20 1045  piperacillin-tazobactam (ZOSYN) IVPB 3.375 g        3.375 g 12.5 mL/hr over 240 Minutes Intravenous Every 8 hours 12/29/20 0956 01/04/21 0152   12/24/20 0800  ciprofloxacin (CIPRO) tablet 500 mg  Status:  Discontinued        500 mg Oral Daily with breakfast 12/23/20 1721 12/23/20 1737   12/23/20 2200   metroNIDAZOLE (FLAGYL) tablet 500 mg  Status:  Discontinued        500 mg Oral Every 8 hours 12/23/20 1721 12/23/20 1737   12/23/20 2200  amoxicillin-clavulanate (AUGMENTIN) 500-125 MG per tablet 500 mg  Status:  Discontinued        1 tablet Oral 2 times daily 12/23/20 1739 12/29/20 0949   12/18/20 1100  Ampicillin-Sulbactam (UNASYN) 3 g in sodium chloride 0.9 % 100 mL IVPB  Status:  Discontinued        3 g 200 mL/hr over 30 Minutes Intravenous Every 12 hours 12/18/20 0729 12/23/20 1721   12/17/20 1400  Ampicillin-Sulbactam (UNASYN) 3 g in sodium chloride 0.9 % 100 mL IVPB  Status:  Discontinued        3 g 200 mL/hr over 30 Minutes Intravenous Every 6 hours 12/17/20 1004 12/18/20 0729   12/16/20 2200  ciprofloxacin (CIPRO) IVPB 400 mg  Status:  Discontinued        400 mg 200 mL/hr over 60 Minutes Intravenous Every 12 hours 12/16/20 1451 12/16/20 1734   12/16/20 2200  piperacillin-tazobactam (ZOSYN) IVPB 3.375 g  Status:  Discontinued        3.375 g 12.5 mL/hr over 240 Minutes Intravenous Every 8 hours 12/16/20 1734 12/17/20 0949   12/16/20 1800  metroNIDAZOLE (FLAGYL) IVPB 500 mg  Status:  Discontinued        500 mg 100 mL/hr over 60 Minutes Intravenous Every 8 hours 12/16/20 1451 12/16/20 1734   12/16/20 1515  valACYclovir (VALTREX) tablet 500 mg  Status:  Discontinued        500 mg Oral 2 times daily 12/16/20 1505 12/30/20 2048   12/16/20 0915  ciprofloxacin (CIPRO) IVPB 400 mg        400 mg 200 mL/hr over 60 Minutes Intravenous  Once 12/16/20 0912 12/16/20 1104   12/16/20 0915  metroNIDAZOLE (FLAGYL) IVPB 500 mg        500 mg 100 mL/hr over 60 Minutes Intravenous  Once 12/16/20 0912 12/16/20 1104       Assessment/Plan Polycystic disease with renal transplant 2013-Prograf/prednisone.Renal signed off 2/16 Elevated Cr CHF/uncertain etiology -Cards following Chronic COPD/history of tobacco use- On 2L PRN at home Pleuritic chest pain and SOB- VQ 1/25:Low probability  pulmonary embolus. Age indeterminate, non occlusive deep vein thrombosis involving the right popliteal vein Hx SVT Atrial flutter with RVR- on eliquis, which is currently on hold Large hematoma left arm - drained and dressed- cleaning up hematoma still present Anemia - H/h stable; transfusion per Hospitalists and Renal Severe protein calorie malnutrition- prealbumin 10.3 (2/17). Dietician consult - Per medicine -  Perforated sigmoid diverticulitis -S/pExploratory laparotomy with sigmoid colectomy/end colostomy, 12/29/2020 Dr. Coralie Keens - POD#15 - WBC WNL, afebrile - ostomy functioning. WOC following  for new ostomy teaching  -Will complete 3 days of Dakins to abdominal wound today for pseudomonas, back to normal saline for BID wet to dry dressing changes tomorrow - Continue CM diet, supplements per dietician, encourage PO intake - Continue PT/OT, mobilize as able. Currently recommending SNF  ZOX:WRUEAV, supplements per RD ID: Zosyn 2/6-2/12 DVT: IV Heparin, H/h stable after restarting IV heparin - ok to transition to PO anticoagulation Follow-up: Dr. Ninfa Linden   LOS: 28 days    Wellington Hampshire, [facility] 01/13/2021, 8:27 AM Please see Amion for pager number during day hours 7:00am-4:30pm

## 2021-01-13 NOTE — Progress Notes (Signed)
Physical Therapy Treatment Patient Details Name: April [name] MRN: [id number] DOB: 12-Oct-1956 Today's Date: 01/13/2021    History of Present Illness 65 year old F with PMH of severe persistent asthma, chronic hypoxic RF on 2 L as needed, ESRD s/p deceased renal transplant in 02/18/12 on immunomodulators and followed at [facility], diastolic CHF, MGUS and HTN admitted for uncomplicated sigmoid diverticulitis and new onset A. fib with RVR.  She underwent TEE with DCCV on 2/6 but developed acute abdominal pain.  CT showed perforated diverticulitis.  She underwent laparotomy with Hartman's procedure but required MV and vasopressors postop.  She also stabilized in ICU and transferred back to [facility] service point 01/12/2021.  Postop complicated by slow return of bowel functions, recurrent acute on chronic kidney disease, volume overload and acute blood loss anemia.  Cardiology recommended anticoagulation at least for 30 days post DCCV, 02/06>>>. Cleared for discharge by nephrology and general surgery. Waiting on SNF bed    PT Comments    General Comments: AxO x 2 flat affect but following all commands.  General bed mobility comments: profoundly week requiring increased assist with c/o nausea and ABD pain.  Only able to achieve partial upright EOB. Limited by pain.  Limited by profound weakness.General transfer comment: used MAXI MOVE to assist pt OOB to recliner.  Attempted static and dynamic sitting TE's while in recliner.  Pt required MAX Assist and unable to static hold.  Increased ABD pain and nausea ceased further tolerance.  Performed a few AAROM B LE SAQ's and AP.  Assisted with sips of water.  Pt barely able to hold a cup to her mouth.     Follow Up Recommendations  SNF     Equipment Recommendations  None recommended by PT    Recommendations for Other Services       Precautions / Restrictions Precautions Precautions: Fall Precaution Comments: Colostomy    Mobility  Bed Mobility Overal bed  mobility: Needs Assistance Bed Mobility: Supine to Sit;Sit to Sidelying;Rolling     Supine to sit: Max assist;HOB elevated;+2 for safety/equipment;Total assist     General bed mobility comments: profoundly week requiring increased assist with c/o nausea and ABD pain.  Only able to achieve partial upright EOB. Limited by pain.  Limited by profound weakness.    Transfers Overall transfer level: Needs assistance               General transfer comment: used MAXI MOVE to assist pt OOB to recliner.  Attempted static and dynamic sitting TE's while in recliner.  Pt required MAX Assist and unable to static hold.  Increased ABD pain and nausea ceased further tolerance.  Performed a few AAROM B LE SAQ's and AP.  Ambulation/Gait                 Stairs             Wheelchair Mobility    Modified Rankin (Stroke Patients Only)       Balance                                            Cognition Arousal/Alertness: Awake/alert Behavior During Therapy: WFL for tasks assessed/performed Overall Cognitive Status: Within Functional Limits for tasks assessed  General Comments: AxO x 2 flat affect but following all commands      Exercises      General Comments        Pertinent Vitals/Pain Pain Assessment: Faces Faces Pain Scale: Hurts even more Pain Location: abdomen, pelvis Pain Descriptors / Indicators: Grimacing;Guarding Pain Intervention(s): Monitored during session;Repositioned    Home Living                      Prior Function            PT Goals (current goals can now be found in the care plan section) Progress towards PT goals: Progressing toward goals    Frequency    Min 2X/week      PT Plan Current plan remains appropriate;Discharge plan needs to be updated;Frequency needs to be updated    Co-evaluation              AM-PAC PT "6 Clicks" Mobility   Outcome  Measure  Help needed turning from your back to your side while in a flat bed without using bedrails?: A Lot Help needed moving from lying on your back to sitting on the side of a flat bed without using bedrails?: A Lot Help needed moving to and from a bed to a chair (including a wheelchair)?: Total Help needed standing up from a chair using your arms (e.g., wheelchair or bedside chair)?: Total Help needed to walk in hospital room?: Total Help needed climbing 3-5 steps with a railing? : Total 6 Click Score: 8    End of Session Equipment Utilized During Treatment: Oxygen Activity Tolerance: Patient limited by fatigue;Patient limited by pain Patient left: in chair;with call bell/phone within reach Nurse Communication: Mobility status;Need for lift equipment PT Visit Diagnosis: Muscle weakness (generalized) (M62.81);Pain     Time: 1203-1242 PT Time Calculation (min) (ACUTE ONLY): 39 min  Charges:  $Therapeutic Exercise: 8-22 mins $Therapeutic Activity: 23-37 mins                     Rica Koyanagi  PTA Acute  Rehabilitation Services Pager      [phone] Office      [phone]

## 2021-01-14 DIAGNOSIS — K5792 Diverticulitis of intestine, part unspecified, without perforation or abscess without bleeding: Secondary | ICD-10-CM | POA: Diagnosis not present

## 2021-01-14 DIAGNOSIS — Z940 Kidney transplant status: Secondary | ICD-10-CM | POA: Diagnosis not present

## 2021-01-14 DIAGNOSIS — I5031 Acute diastolic (congestive) heart failure: Secondary | ICD-10-CM | POA: Diagnosis not present

## 2021-01-14 DIAGNOSIS — R1114 Bilious vomiting: Secondary | ICD-10-CM | POA: Diagnosis not present

## 2021-01-14 DIAGNOSIS — I480 Paroxysmal atrial fibrillation: Secondary | ICD-10-CM

## 2021-01-14 DIAGNOSIS — L89309 Pressure ulcer of unspecified buttock, unspecified stage: Secondary | ICD-10-CM

## 2021-01-14 LAB — GLUCOSE, CAPILLARY
Glucose-Capillary: 188 mg/dL — ABNORMAL HIGH (ref 70–99)
Glucose-Capillary: 233 mg/dL — ABNORMAL HIGH (ref 70–99)
Glucose-Capillary: 228 mg/dL — ABNORMAL HIGH (ref 70–99)

## 2021-01-14 LAB — CBC
HCT: 27.5 % — ABNORMAL LOW (ref 36.0–46.0)
Hemoglobin: 8.9 g/dL — ABNORMAL LOW (ref 12.0–15.0)
MCH: 32.5 pg (ref 26.0–34.0)
MCHC: 32.4 g/dL (ref 30.0–36.0)
MCV: 100.4 fL — ABNORMAL HIGH (ref 80.0–100.0)
Platelets: 180 10*3/uL (ref 150–400)
RBC: 2.74 MIL/uL — ABNORMAL LOW (ref 3.87–5.11)
RDW: 20.6 % — ABNORMAL HIGH (ref 11.5–15.5)
WBC: 3.2 10*3/uL — ABNORMAL LOW (ref 4.0–10.5)
nRBC: 0 % (ref 0.0–0.2)

## 2021-01-14 LAB — SARS CORONAVIRUS 2 (TAT 6-24 HRS): SARS Coronavirus 2: NEGATIVE

## 2021-01-14 LAB — MAGNESIUM: Magnesium: 2.2 mg/dL (ref 1.7–2.4)

## 2021-01-14 MED ORDER — AMLODIPINE BESYLATE 5 MG PO TABS: 5.0000 mg | ORAL_TABLET | Freq: Every day | ORAL | Status: AC

## 2021-01-14 MED ORDER — INSULIN ASPART 100 UNIT/ML ~~LOC~~ SOLN
0.0000 [IU] | Freq: Three times a day (TID) | SUBCUTANEOUS | 11 refills | Status: AC
Start: 2021-01-14 — End: ?

## 2021-01-14 MED ORDER — OXYCODONE HCL 5 MG PO TABS: 5.0000 mg | ORAL_TABLET | Freq: Four times a day (QID) | ORAL | 0 refills | Status: DC | PRN

## 2021-01-14 MED ORDER — FUROSEMIDE 40 MG PO TABS: 40.0000 mg | ORAL_TABLET | Freq: Two times a day (BID) | ORAL | Status: AC

## 2021-01-14 MED ORDER — COLCHICINE 0.6 MG PO TABS: 0.3000 mg | ORAL_TABLET | Freq: Every day | ORAL | Status: AC

## 2021-01-14 MED ORDER — PROSOURCE PLUS PO LIQD: 30.0000 mL | Freq: Two times a day (BID) | ORAL | Status: AC

## 2021-01-14 MED ORDER — APIXABAN 5 MG PO TABS: 5.0000 mg | ORAL_TABLET | Freq: Two times a day (BID) | ORAL | Status: AC

## 2021-01-14 MED ORDER — ALLOPURINOL 100 MG PO TABS: 100.0000 mg | ORAL_TABLET | Freq: Every day | ORAL | Status: DC

## 2021-01-14 MED ORDER — DAKINS (1/4 STRENGTH) 0.125 % EX SOLN: Freq: Two times a day (BID) | CUTANEOUS | 0 refills | Status: AC

## 2021-01-14 MED ORDER — PANTOPRAZOLE SODIUM 40 MG PO TBEC: 40.0000 mg | DELAYED_RELEASE_TABLET | Freq: Every day | ORAL | Status: AC

## 2021-01-14 MED ORDER — INSULIN GLARGINE 100 UNIT/ML ~~LOC~~ SOLN: 5.0000 [IU] | Freq: Two times a day (BID) | SUBCUTANEOUS | 11 refills | Status: AC

## 2021-01-14 MED ORDER — DAKINS (1/4 STRENGTH) 0.125 % EX SOLN
Freq: Two times a day (BID) | CUTANEOUS | Status: DC
  Filled 2021-01-14: qty 473

## 2021-01-14 MED ORDER — METOPROLOL TARTRATE 25 MG PO TABS: 25.0000 mg | ORAL_TABLET | Freq: Two times a day (BID) | ORAL | Status: AC

## 2021-01-14 MED ORDER — ALLOPURINOL 100 MG PO TABS
100.0000 mg | ORAL_TABLET | Freq: Every day | ORAL | Status: DC
  Administered 2021-01-14: 100 mg via ORAL
  Filled 2021-01-14: qty 1

## 2021-01-14 MED ORDER — PREDNISONE 2.5 MG PO TABS: 7.5000 mg | ORAL_TABLET | Freq: Every day | ORAL | Status: AC

## 2021-01-14 MED ORDER — ONDANSETRON HCL 4 MG PO TABS: 4.0000 mg | ORAL_TABLET | Freq: Three times a day (TID) | ORAL | 1 refills | Status: AC | PRN

## 2021-01-14 MED ORDER — COLCHICINE 0.3 MG HALF TABLET
0.3000 mg | ORAL_TABLET | Freq: Every day | ORAL | Status: DC
Start: 2021-01-15 — End: 2021-01-15

## 2021-01-14 MED ORDER — ONDANSETRON HCL 4 MG PO TABS
8.0000 mg | ORAL_TABLET | Freq: Once | ORAL | Status: AC
  Administered 2021-01-14: 8 mg via ORAL
  Filled 2021-01-14: qty 2

## 2021-01-14 MED ORDER — ENSURE ENLIVE PO LIQD: 237.0000 mL | Freq: Two times a day (BID) | ORAL | 12 refills | Status: DC

## 2021-01-14 NOTE — Progress Notes (Signed)
[facility] Progress Note  16 Days Post-Op  Subjective: CC-  No new complaints. She drank 2-3 protein drinks yesterday. Still has some nausea, no emesis. Colostomy functioning.  Hemoglobin stable after restarting eliquis yesterday.  Objective: Vital signs in last 24 hours: Temp:  [97.3 F (36.3 C)-98.4 F (36.9 C)] 97.8 F (36.6 C) (02/22 0431) Pulse Rate:  [61-93] 72 (02/22 0431) Resp:  [16-20] 20 (02/22 0431) BP: (95-125)/(49-104) 111/92 (02/22 0431) SpO2:  [100 %] 100 % (02/22 0431) Weight:  [72.4 kg] 72.4 kg (02/22 0450) Last BM Date: 01/13/21  Intake/Output from previous day: 02/21 0701 - 02/22 0700 In: 527 [P.O.:527] Out: 1500 [Urine:950; Stool:550] Intake/Output this shift: No intake/output data recorded.  PE: Gen: Alert, NAD, frail Pulm:rate and effort normal. On 2L supplemental O2 via [place] SWN:IOEV, nondistended, appropriately tender, ostomyviable with liquid brown stool and air in pouch,open midline woundsome visible sutures and fibrinous exudate at the base- fascia intact but some areas of necrosis, some green drainage noted on dressing today  Lab Results:  Recent Labs    01/13/21 0049 01/14/21 0520  WBC 2.6* 3.2*  HGB 8.8* 8.9*  HCT 27.9* 27.5*  PLT 139* 180   BMET Recent Labs    01/12/21 0250 01/13/21 0049  NA 131* 132*  K 3.9 4.1  CL 96* 98  CO2 28 25  GLUCOSE 189* 258*  BUN 54* 48*  CREATININE 1.03* 0.97  CALCIUM 8.7* 8.8*   PT/INR No results for input(s): LABPROT, INR in the last 72 hours. CMP     Component Value Date/Time   NA 132 (L) 01/13/2021 0049   K 4.1 01/13/2021 0049   CL 98 01/13/2021 0049   CO2 25 01/13/2021 0049   GLUCOSE 258 (H) 01/13/2021 0049   BUN 48 (H) 01/13/2021 0049   CREATININE 0.97 01/13/2021 0049   CREATININE 1.28 (H) 11/04/2020 0952   CALCIUM 8.8 (L) 01/13/2021 0049   CALCIUM 10.8 (H) 10/13/2011 1405   PROT 5.4 (L) 01/08/2021 1021   ALBUMIN 2.0 (L) 01/13/2021 0049   AST 10 (L)  01/08/2021 1021   AST 13 (L) 11/04/2020 0952   ALT 8 01/08/2021 1021   ALT 12 11/04/2020 0952   ALKPHOS 62 01/08/2021 1021   BILITOT 1.5 (H) 01/08/2021 1021   BILITOT 0.6 11/04/2020 0952   GFRNONAA >60 01/13/2021 0049   GFRNONAA 47 (L) 11/04/2020 0952   GFRAA 51 (L) 08/05/2020 1523   Lipase     Component Value Date/Time   LIPASE 49 12/29/2020 0822       Studies/Results: DG Elbow 2 Views Right  Result Date: 01/12/2021 CLINICAL DATA:  Right elbow pain. EXAM: RIGHT ELBOW - 2 VIEW COMPARISON:  None. FINDINGS: No gross fracture or dislocation on this portable study. Exam is limited by positioning and assessment for joint effusion is limited. No worrisome lytic or sclerotic osseous abnormality. IMPRESSION: Limited study due to positioning. No gross fracture or dislocation. Nondisplaced radial neck fracture a concern. Repeat dedicated elbow films recommended when the patient is better able to participate with positioning. Electronically Signed   By: Misty Stanley M.D.   On: 01/12/2021 13:47    Anti-infectives: Anti-infectives (From admission, onward)   Start     Dose/Rate Route Frequency Ordered Stop   12/31/20 2200  valACYclovir (VALTREX) tablet 500 mg        500 mg Oral 2 times daily 12/31/20 2100     12/31/20 1000  valACYclovir (VALTREX) compounded oral suspension 500 mg  Status:  Discontinued        500 mg Per Tube 2 times daily 12/30/20 2051 12/31/20 2100   12/29/20 1045  piperacillin-tazobactam (ZOSYN) IVPB 3.375 g        3.375 g 12.5 mL/hr over 240 Minutes Intravenous Every 8 hours 12/29/20 0956 01/04/21 0152   12/24/20 0800  ciprofloxacin (CIPRO) tablet 500 mg  Status:  Discontinued        500 mg Oral Daily with breakfast 12/23/20 1721 12/23/20 1737   12/23/20 2200  metroNIDAZOLE (FLAGYL) tablet 500 mg  Status:  Discontinued        500 mg Oral Every 8 hours 12/23/20 1721 12/23/20 1737   12/23/20 2200  amoxicillin-clavulanate (AUGMENTIN) 500-125 MG per tablet 500 mg  Status:   Discontinued        1 tablet Oral 2 times daily 12/23/20 1739 12/29/20 0949   12/18/20 1100  Ampicillin-Sulbactam (UNASYN) 3 g in sodium chloride 0.9 % 100 mL IVPB  Status:  Discontinued        3 g 200 mL/hr over 30 Minutes Intravenous Every 12 hours 12/18/20 0729 12/23/20 1721   12/17/20 1400  Ampicillin-Sulbactam (UNASYN) 3 g in sodium chloride 0.9 % 100 mL IVPB  Status:  Discontinued        3 g 200 mL/hr over 30 Minutes Intravenous Every 6 hours 12/17/20 1004 12/18/20 0729   12/16/20 2200  ciprofloxacin (CIPRO) IVPB 400 mg  Status:  Discontinued        400 mg 200 mL/hr over 60 Minutes Intravenous Every 12 hours 12/16/20 1451 12/16/20 1734   12/16/20 2200  piperacillin-tazobactam (ZOSYN) IVPB 3.375 g  Status:  Discontinued        3.375 g 12.5 mL/hr over 240 Minutes Intravenous Every 8 hours 12/16/20 1734 12/17/20 0949   12/16/20 1800  metroNIDAZOLE (FLAGYL) IVPB 500 mg  Status:  Discontinued        500 mg 100 mL/hr over 60 Minutes Intravenous Every 8 hours 12/16/20 1451 12/16/20 1734   12/16/20 1515  valACYclovir (VALTREX) tablet 500 mg  Status:  Discontinued        500 mg Oral 2 times daily 12/16/20 1505 12/30/20 2048   12/16/20 0915  ciprofloxacin (CIPRO) IVPB 400 mg        400 mg 200 mL/hr over 60 Minutes Intravenous  Once 12/16/20 0912 12/16/20 1104   12/16/20 0915  metroNIDAZOLE (FLAGYL) IVPB 500 mg        500 mg 100 mL/hr over 60 Minutes Intravenous  Once 12/16/20 0912 12/16/20 1104       Assessment/Plan Polycystic disease with renal transplant 2013-Prograf/prednisone.Renal signed off 2/16 Elevated Cr CHF/uncertain etiology -Cards following Chronic COPD/history of tobacco use- On 2L PRN at home Pleuritic chest pain and SOB- VQ 1/25:Low probability pulmonary embolus. Age indeterminate, non occlusive deep vein thrombosis involving the right popliteal vein Hx SVT Atrial flutter with RVR- restarted eliquis 2/21 Large hematoma left arm - drained and dressed Anemia -  H/h stable; transfusion per Hospitalists and Renal Severe protein calorie malnutrition- prealbumin 10.3 (2/17). Dietician consult - Per medicine -  Perforated sigmoid diverticulitis -S/pExploratory laparotomy with sigmoid colectomy/end colostomy, 12/29/2020 Dr. Coralie Keens - POD#16 - WBC WNL, afebrile - ostomy functioning. WOC following for new ostomy teaching  -BID wet to dry dressing changes - Continue CM diet, supplements per dietician, encourage PO intake - Continue PT/OT, mobilize as able. Currently recommending SNF  NOB:[id number], supplements per RD ID: [email or web address] 2/6-2/12 DVT: eliquis Follow-up: Dr. Ninfa Linden   Plan:  Plan dakins for BID wet to dry dressing changes for 3 more days, then return to normal saline. Encourage PO intake. [place] for discharge to SNF from surgical standpoint. Discharge instructions and follow up info on AVS.   LOS: 29 days    Wellington Hampshire, [facility] 01/14/2021, 8:26 AM Please see Amion for pager number during day hours 7:00am-4:30pm

## 2021-01-14 NOTE — Discharge Summary (Signed)
Physician Discharge Summary  April [name] ZOX:[id number] DOB: 03-Nov-1956 DOA: 12/16/2020  PCP: [name], MD  Admit date: 12/16/2020 Discharge date: 01/14/2021  Admitted From: Home Disposition: SNF  Recommendations for Outpatient Follow-up:  1. Follow ups as below. 2. Please obtain CBC/BMP/Mag in 1 week 3. Please follow up on the following pending results: None   Discharge Condition: Stable CODE STATUS: Full code   Follow-up Information    Croitoru, Mihai, MD Follow up on 03/14/2021.   Specialty: Cardiology Why: at 8:40 AM Contact information: [place] [address] [phone]        Carol Ada, MD Follow up.   Specialty: Gastroenterology Contact information: [place], [place] [address] [phone]        Coralie Keens, MD. Call.   Specialty: General Surgery Why: We are working on your appointment, call to confirm Please arrive 30 minutes prior to your appointment to check in and fill out paperwork. Bring photo ID and insurance information. 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 fib with RVR.  She underwent TEE with DCCV on 2/6 but developed acute abdominal pain.  CT showed perforated diverticulitis.  She underwent laparotomy with Hartman's procedure but required MV and vasopressors postop.  She also stabilized in ICU and transferred back to [facility] service point 01/12/2021.  Postop complicated by slow return of bowel functions, recurrent acute on chronic kidney disease, volume overload and acute blood loss anemia that have improved.  Cardiology recommended  anticoagulation at least for 30 days post DCCV, (12/29/20>>> 01/28/2021). Cleared for discharge by general surgery and other consultants for outpatient follow-up.  See individual problem list below for more on hospital course.  Discharge Diagnoses:  Acute on chronic hypoxemic respiratory failure due to septic shock in the setting of perforated diverticulitis/peritonitis-seems to have resolved. Stable on home 2 L. -ETT 2/6-2/8. -Completed antibiotic course with IV Zosyn on 2/12. -Out of bed, PT/OT -Continue home 2 L by nasal cannula -Continue breathing treatments -Outpatient follow-up with pulmonology  Septic shock due to perforated acute sigmoid diverticulitis s/p ex lap, sigmoid colectomy & end colostomy -Completed antibiotic course with IV Zosyn. -Laparotomy wound and ostomy care -Outpatient follow-up with general surgery and GI -Low-dose oxycodone for pain control  A flutter with RVR s/p TEE and DCCV on 12/29/2020  -Continue metoprolol 25 mg twice daily -Cardiology recommendations:             -Uninterrupted AC for 30 days post DCCV-transitioned to Eliquis.  Continue through 3/80/22             -Elective ablation in the future.             -Outpatient follow-up with [facility] EP team  ABLA superimposed on ACD-surgical blood loss and hematoma to left arm. H&H stable. Recent Labs    01/05/21 1202 01/06/21 1018 01/07/21 0252 01/08/21 0322 01/09/21 0511 01/10/21 0438 01/11/21 0023 01/12/21 0250 01/13/21 0049 01/14/21 0520  HGB 8.2* 8.6* 7.5* 7.4* 7.4* 9.5* 8.9* 8.1* 8.8* 8.9*  -Continue p.o. ferrous sulfate -Check CBC in 1 week  Acute on chronic diastolic CHF: TTE ordered  1/25 with LVEF of 70 to 75%, G2-DD.  Some evidence of fluid overload with bilateral lower extremity edema and extravasation, Rt>Lt. excellent urine output.  Creatinine improving. -Continue oral Lasix 40 mg twice daily per nephrology -Discontinue diltiazem.  Heart rate stable on metoprolol. -Monitor fluid  status -Check renal function and magnesium in 1 week.  AKI on CKD-3A/azotemia in patient with Hx of ESRD s/p deceased donor transplant in 02/23/12:  Followed at [facility].  AKI resolved. Recent Labs    01/05/21 0511 01/06/21 0400 01/07/21 0252 01/08/21 0322 01/08/21 1021 01/09/21 0511 01/10/21 0438 01/11/21 0023 01/12/21 0250 01/13/21 0049  BUN 77* 76* 74* 81* 73* 76* 72* 64* 54* 48*  CREATININE 1.84* 1.78* 1.45* 1.44* 1.45* 1.42* 1.19* 1.20* 1.03* 0.97  -Recheck renal function in 1 week -Continue home Prograf, Myfortic and home prednisone  NIDDM-2 with renal disease and hyperglycemia Last Labs          Recent Labs  Lab 01/12/21 0831 01/12/21 1307 01/12/21 1707 01/12/21 02-23-07 01/13/21 0809  GLUCAP 157* 227* 166* 181* 151*    -Lantus 5 units twice daily -Continue sliding scale-sensitive  Left upper arm wound after hematoma evacuation-stable. -Wet-to-dry dressing daily  Acute gout flareup in right elbow-no acute finding on x-ray reports limited steadily. Uric acid elevated. -Continue colchicine 0.3 mg daily -Resume home allopurinol  Hypernatremia/hyperkalemia: Resolved.  Hypomagnesemia:  Resolved.  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 TBI abnormal. -Continue low-dose amlodipine -Encourage warm hand wears  Suspected OSA/chronic COPD -Nightly CPAP while in-house -Needs outpatient sleep study. -Outpatient follow-up with pulmonology/Dr. Vaughan Browner is her primary pulmonologist.   Age-indeterminate nonocclusive DVT of right PV-may not need treatment for this but Rx for DVT given immobility.  VQ scan low probability for PE. -Anticoagulation as above  Thrombocytopenia: Resolved.  Debility/physical deconditioning in the setting of acute illness -PT/OT recommended SNF  Nutritional Status Body mass index is 30.2 kg/m. Nutrition  Problem: Increased nutrient needs Etiology: acute illness Signs/Symptoms: estimated needs Interventions: Ensure Enlive (each supplement provides 350kcal and 20 grams of protein),Prostat,Magic cup   Obesity Body mass index is 30.16 kg/m. Nutrition Problem: Increased nutrient needs Etiology: acute illness Signs/Symptoms: estimated needs Interventions: Ensure Enlive (each supplement provides 350kcal and 20 grams of protein),Prostat,Magic cup  Stage II sacral injury: Pressure Injury 12/29/20 Sacrum Medial Stage 2 -  Partial thickness loss of dermis presenting as a shallow open injury with a red, pink wound bed without slough. (Active)  12/29/20 1130  Location: Sacrum  Location Orientation: Medial  Staging: Stage 2 -  Partial thickness loss of dermis presenting as a shallow open injury with a red, pink wound bed without slough.  Wound Description (Comments):   Present on Admission: No    Discharge Exam: Vitals:   01/14/21 0841 01/14/21 0842  BP: (!) 129/56   Pulse: (!) 37 74  Resp: 18   Temp: 98 F (36.7 C)   SpO2: 100%     GENERAL: No apparent distress.  Nontoxic. HEENT: MMM.  Vision and hearing grossly intact.  NECK: Supple.  No apparent JVD.  RESP: On 2 L.  No IWOB.  Fair aeration bilaterally. CVS:  RRR. Heart sounds normal.  ABD/GI/GU: Bowel sounds present. Soft.  Laparotomy wound with granulation tissue.  Ostomy with normal output. MSK/EXT:  Moves extremities. No apparent deformity.  Trace edema in all extremities. SKIN: New dressing over laparotomy wound DCI.  Dressing over LUE DCI. NEURO: Awake, alert and oriented appropriately.  No apparent focal neuro deficit. PSYCH: Calm. Normal affect.   Discharge Instructions   Allergies as of 01/14/2021      Reactions   Infed [iron Dextran] Other (See Comments)   Chest tightness   Pentamidine Itching, Shortness Of Breath, Swelling   Budesonide-formoterol Fumarate    Other reaction(s): hoarseness   Bupropion    Other  reaction(s): exacerbated depression   Crestor [rosuvastatin]    Other reaction(s): body aches, swelling   Duloxetine Hcl    Other reaction(s): GI SE, weepy, worsening fatigue, foggy   Erythromycin [erythromycin] Other (See Comments)   Mouth Ulcers   Iohexol Other (See Comments)   Per patient "she has had a kidney transplant and should never have contrast"   Oxycodone Nausea Only, Nausea And Vomiting   Pravastatin Sodium    Other reaction(s): myalgias   Erythromycin Rash   Causes breakout in mouth   Ultram [tramadol Hcl] Anxiety      Medication List    STOP taking these medications   amoxicillin 500 MG capsule Commonly known as: AMOXIL   ciprofloxacin 500 MG tablet Commonly known as: CIPRO   isosorbide mononitrate 60 MG 24 hr tablet Commonly known as: IMDUR   magnesium oxide 400 MG tablet Commonly known as: MAG-OX   metroNIDAZOLE 500 MG tablet Commonly known as: FLAGYL   verapamil 240 MG CR tablet Commonly known as: CALAN-SR     TAKE these medications   (feeding supplement) PROSource Plus liquid Take 30 mLs by mouth 2 (two) times daily between meals.   feeding supplement Liqd Take 237 mLs by mouth 2 (two) times daily between meals.   acetaminophen 325 MG tablet Commonly known as: TYLENOL Take 2 tablets (650 mg total) by mouth every 6 (six) hours as needed for mild pain (or Fever >/= 101).   albuterol 108 (90 Base) MCG/ACT inhaler Commonly known as: VENTOLIN HFA Inhale 2 puffs into the lungs every 4 (four) hours as needed for wheezing or shortness of breath.   allopurinol 100 MG tablet Commonly known as: ZYLOPRIM Take 100 mg by mouth daily.   amLODipine 5 MG tablet Commonly known as: NORVASC Take 1 tablet (5 mg total) by mouth daily. Start taking on: January 15, 2021   apixaban 5 MG Tabs tablet Commonly known as: ELIQUIS Take 1 tablet (5 mg total) by mouth 2 (two) times daily.   Breztri Aerosphere 160-9-4.8 MCG/ACT Aero Generic drug:  Budeson-Glycopyrrol-Formoterol Inhale 2 puffs into the lungs 2 (two) times daily.   brimonidine 0.1 % Soln Commonly known as: ALPHAGAN P Place 1 drop into both eyes 2 (two) times daily.   cholecalciferol 25 MCG (1000 UNIT) tablet Commonly known as: VITAMIN D3 Take 1 tablet (1,000 Units total) by mouth daily.   colchicine 0.6 MG tablet Take 0.5 tablets (0.3 mg total) by mouth daily. Start taking on: January 15, 2021   cyanocobalamin 1000 MCG tablet Take 1,000 mcg by mouth daily.   cycloSPORINE 0.05 % ophthalmic emulsion Commonly known as: RESTASIS Place 1 drop into both eyes 2 (two) times daily.   dorzolamide 2 % ophthalmic solution Commonly known as: TRUSOPT Place 1 drop into both eyes daily.   ferrous sulfate 325 (65 FE) MG tablet Take 325 mg by mouth daily with breakfast.   FLUoxetine 40 MG capsule Commonly known as: PROZAC Take 1 capsule (40 mg total) by mouth daily.   furosemide 40 MG tablet Commonly known as: LASIX Take 1  tablet (40 mg total) by mouth 2 (two) times daily. What changed:   medication strength  how much to take  when to take this   gabapentin 100 MG capsule Commonly known as: NEURONTIN Take 200 mg by mouth 2 (two) times daily.   guaiFENesin 600 MG 12 hr tablet Commonly known as: MUCINEX Take 600 mg by mouth 2 (two) times daily.   insulin aspart 100 UNIT/ML injection Commonly known as: novoLOG Inject 0-9 Units into the skin 3 (three) times daily with meals. CBG 70 - 120: 0 units CBG 121 - 150: 1 unit CBG 151 - 200: 2 units CBG 201 - 250: 3 units CBG 251 - 300: 5 units CBG 301 - 350: 7 units CBG 351 - 400: 9 units   insulin glargine 100 UNIT/ML injection Commonly known as: LANTUS Inject 0.05 mLs (5 Units total) into the skin 2 (two) times daily.   ipratropium-albuterol 0.5-2.5 (3) MG/3ML Soln Commonly known as: DUONEB Take 3 mLs by nebulization every 6 (six) hours as needed (sob/wheezing).   metoprolol tartrate 25 MG  tablet Commonly known as: LOPRESSOR Take 1 tablet (25 mg total) by mouth 2 (two) times daily.   mycophenolate 180 MG EC tablet Commonly known as: MYFORTIC Take 180 mg by mouth 2 (two) times daily.   ondansetron 4 MG disintegrating tablet Commonly known as: Zofran ODT Take 1 tablet (4 mg total) by mouth every 8 (eight) hours as needed for nausea or vomiting.   ondansetron 4 MG tablet Commonly known as: Zofran Take 1 tablet (4 mg total) by mouth every 8 (eight) hours as needed for nausea or vomiting.   oxyCODONE 5 MG immediate release tablet Commonly known as: Oxy IR/ROXICODONE Take 1 tablet (5 mg total) by mouth every 6 (six) hours as needed for moderate pain or severe pain (Unresponsive to acetaminophen).   pantoprazole 40 MG tablet Commonly known as: PROTONIX Take 1 tablet (40 mg total) by mouth daily. Start taking on: January 15, 2021 What changed: when to take this   predniSONE 2.5 MG tablet Commonly known as: DELTASONE Take 3 tablets (7.5 mg total) by mouth daily with breakfast. Start taking on: January 15, 2021 What changed:   medication strength  how much to take  how to take this  when to take this  additional instructions   Prograf 1 MG capsule Generic drug: tacrolimus Take 1 mg by mouth 2 (two) times daily.   promethazine 25 MG tablet Commonly known as: PHENERGAN Take 1 tablet (25 mg total) by mouth every 8 (eight) hours as needed for up to 15 doses for nausea or vomiting.   sodium hypochlorite 0.125 % Soln Commonly known as: DAKIN'S 1/4 STRENGTH Irrigate with as directed 2 (two) times daily.   Travatan Z 0.004 % Soln ophthalmic solution Generic drug: Travoprost (BAK Free) Place 1 drop into both eyes at bedtime.   valACYclovir 500 MG tablet Commonly known as: VALTREX Take 500 mg by mouth 2 (two) times daily.       Consultations:  General surgery  Pulmonology  Gastroenterology  Nephrology  Procedures/Studies: 2/6-exploratory  laparotomy, sigmoid colectomy and end colostomy 2/6-TEE with DCCV for A. Fib 2/6-2/8-ETT 2/14-I&D of left arm hematoma by [facility]   CT ABDOMEN PELVIS WO CONTRAST  Result Date: 12/29/2020 CLINICAL DATA:  Increasing abdominal pain. Reason complication of diverticulitis. Renal transplant. EXAM: CT ABDOMEN AND PELVIS WITHOUT CONTRAST TECHNIQUE: Multidetector CT imaging of the abdomen and pelvis was performed following the standard protocol without IV contrast. COMPARISON:  CT of the abdomen and pelvis without contrast 12/16/2020. FINDINGS: Lower chest: Right greater than left pleural effusions are new. There is partial collapse of the right lower lobe. Mild dependent atelectasis is present bilaterally. The heart is enlarged. Coronary artery calcifications are present. Hepatobiliary: Multiple hepatic cysts are again seen. There is some fluid under the diaphragm. No new lesions are present. Gallbladder and common bile duct are within normal limits. Pancreas: Unremarkable. No pancreatic ductal dilatation or surrounding inflammatory changes. Spleen: Stable splenomegaly.  No discrete lesion. Adrenals/Urinary Tract: Bilateral renal cystic disease is stable. No new lesions are present. Right lower quadrant renal transplant is unremarkable. Urinary bladder is within normal limits. Stomach/Bowel: The stomach and duodenum are within normal limits. Small bowel is unremarkable. Terminal ileum is within normal limits. The ascending and transverse colon are unremarkable. Descending colon is within normal limits. Persistent inflammatory changes are evident. New intraperitoneal air is present over the upper abdomen. A fluid collection is present anterior to the urinary bladder, adjacent to the inflamed bowel. The collection measures 4.1 x 2.9 x 2.8 cm. Vascular/Lymphatic: Atherosclerotic calcifications are present in the abdominal aorta and branch vessels without aneurysm. Reproductive: Uterus and bilateral adnexa are unremarkable.  Other: Fluid extends into left inguinal hernia. The paraumbilical hernia is again noted. There is gas within the hernia. Musculoskeletal: Advanced degenerative changes are present in the lumbar spine. Degenerative changes are present in the SI joints bilaterally. Left total hip arthroplasty noted. Remote left pubic symphysis fractures noted. IMPRESSION: 1. Pneumoperitoneum compatible with bowel perforation. 2. New fluid collection anterior to the urinary bladder, adjacent to the inflamed bowel. This is concerning for a developing abscess associated with the sigmoid diverticulitis. 3. Persistent inflammatory changes the sigmoid colon compatible with diverticulitis. 4. New bilateral pleural effusions, right greater than left. 5. Cardiomegaly without failure. 6. Coronary artery disease. 7. Stable splenomegaly. 8. Aortic Atherosclerosis (ICD10-I70.0). Electronically Signed   By: San Morelle M.D.   On: 12/29/2020 09:53   CT ABDOMEN PELVIS WO CONTRAST  Result Date: 12/16/2020 CLINICAL DATA:  Diverticulitis suspected. Abdominal pain in the left lower quadrant. EXAM: CT ABDOMEN AND PELVIS WITHOUT CONTRAST TECHNIQUE: Multidetector CT imaging of the abdomen and pelvis was performed following the standard protocol without IV contrast. COMPARISON:  CT dated December 14, 2020. FINDINGS: Lower chest: There are linear bands of scarring at the lung bases bilaterally. There is a trace right-sided pleural effusion.The heart size is mildly enlarged. Hepatobiliary: Innumerable cysts are noted throughout the patient's liver. Normal gallbladder.There is no biliary ductal dilation. Pancreas: Normal contours without ductal dilatation. No peripancreatic fluid collection. Spleen: Unremarkable. Adrenals/Urinary Tract: --Adrenal glands: Unremarkable. --Right kidney/ureter: The right kidney is enlarged with multiple cysts. --Left kidney/ureter: The left kidney is enlarged with multiple cysts. There is a right pelvic transplant  kidney in place that is grossly unremarkable on this noncontrast examination. --Urinary bladder: Unremarkable. Stomach/Bowel: --Stomach/Duodenum: No hiatal hernia or other gastric abnormality. Normal duodenal course and caliber. --Small bowel: Unremarkable. --Colon: Again noted are findings of mild uncomplicated sigmoid diverticulitis (axial series 2, image 74). Innumerable diverticula are noted throughout the remaining portions of the colon. --Appendix: Normal. Vascular/Lymphatic: Atherosclerotic calcification is present within the non-aneurysmal abdominal aorta, without hemodynamically significant stenosis. --No retroperitoneal lymphadenopathy. --No mesenteric lymphadenopathy. --No pelvic or inguinal lymphadenopathy. Reproductive: Unremarkable Other: No ascites or free air. There is a fat containing umbilical hernia. Musculoskeletal. There is no acute fracture. There is an old healed fracture of the sacrum. The patient is status post prior total hip  arthroplasty on the left. There is an old healed fracture of the left inferior pubic ramus. There is an old healed fracture of the superior pubic ramus on the left. There are old healed bilateral rib fractures. There are old bilateral sacral insufficiency fractures. IMPRESSION: 1. Persistent findings of mild uncomplicated sigmoid diverticulitis. 2. Polycystic kidney and liver disease as before. Unremarkable appearance of the right pelvic transplant kidney. 3. Fat containing umbilical hernia. 4. Trace right-sided pleural effusion. 5. Chronic osseous findings as detailed above. Aortic Atherosclerosis (ICD10-I70.0). Electronically Signed   By: Constance Holster M.D.   On: 12/16/2020 21:22   DG Elbow 2 Views Right  Result Date: 01/12/2021 CLINICAL DATA:  Right elbow pain. EXAM: RIGHT ELBOW - 2 VIEW COMPARISON:  None. FINDINGS: No gross fracture or dislocation on this portable study. Exam is limited by positioning and assessment for joint effusion is limited. No  worrisome lytic or sclerotic osseous abnormality. IMPRESSION: Limited study due to positioning. No gross fracture or dislocation. Nondisplaced radial neck fracture a concern. Repeat dedicated elbow films recommended when the patient is better able to participate with positioning. Electronically Signed   By: Misty Stanley M.D.   On: 01/12/2021 13:47   NM Pulmonary Perfusion  Result Date: 12/17/2020 CLINICAL DATA:  Positive D-dimer. EXAM: NUCLEAR MEDICINE PERFUSION LUNG SCAN TECHNIQUE: Perfusion images were obtained in multiple projections after intravenous injection of radiopharmaceutical. Ventilation scans intentionally deferred if perfusion scan and chest x-ray adequate for interpretation during COVID 19 epidemic. RADIOPHARMACEUTICALS:  4.0 mCi Tc-98m MAA IV COMPARISON:  Chest x-ray 12/16/2020. FINDINGS: Perfusion study only performed. No prominent segmental defect noted to suggest pulmonary embolus. Low probability pulmonary embolus. IMPRESSION: Low probability pulmonary embolus. Electronically Signed   By: Marcello Moores  Register   On: 12/17/2020 15:30   DG CHEST PORT 1 VIEW  Result Date: 01/01/2021 CLINICAL DATA:  65 year old female with respiratory failure. Renal transplant. Recent suspected bowel perforation on CT Abdomen and Pelvis. EXAM: PORTABLE CHEST 1 VIEW COMPARISON:  Portable chest 12/31/2020 and earlier. FINDINGS: Portable AP semi upright view at 0453 hours. Extubated. Enteric tube removed. Stable left IJ central line. The patient is slightly more rotated to the right. Stable cardiac size and mediastinal contours. Veiling bilateral pulmonary opacity, suspicious for increased pleural effusions since the CT on 12/29/2020. Associated dense lung base opacity. No pneumothorax. Upper lung pulmonary vascularity appears normal. Paucity of bowel gas in the upper abdomen. IMPRESSION: 1. Extubated and enteric tube removed. Stable left IJ central line. 2. Veiling and confluent bilateral lung base opacity  suspicious for increased pleural effusions and lower lobe collapse or consolidation since the abdomen CT on 12/29/2020. 3. No overt pulmonary edema. Electronically Signed   By: Genevie Ann M.D.   On: 01/01/2021 08:02   DG Chest Port 1 View  Result Date: 12/31/2020 CLINICAL DATA:  Respiratory failure. EXAM: PORTABLE CHEST 1 VIEW COMPARISON:  12/29/2020 FINDINGS: u 0451 hours. Endotracheal tube tip is 4.4 cm above the base of the carina. The NG tube passes into the stomach although the distal tip position is not included on the film. Left IJ central line tip overlies the innominate vein confluence, stable. The cardio pericardial silhouette is enlarged. There is pulmonary vascular congestion without overt pulmonary edema. Bibasilar atelectasis/infiltrate again noted with probable small bilateral pleural effusions. IMPRESSION: Slight progression of bibasilar atelectasis/infiltrate with small layering pleural effusions visible on the current study. Cardiomegaly with vascular congestion. Electronically Signed   By: Misty Stanley M.D.   On: 12/31/2020 07:40  DG CHEST PORT 1 VIEW  Result Date: 12/29/2020 CLINICAL DATA:  LEFT central line placement. EXAM: PORTABLE CHEST 1 VIEW COMPARISON:  December 25, 2020. FINDINGS: Endotracheal tube placed, tip between clavicular heads approximately 3 cm above the carina. LEFT-sided IJ central venous catheter terminates at the proximal portion of the SVC. Gastric tube courses through in off the field of the radiograph. Leads project over the patient's chest anteriorly. Cardiomediastinal contours stable enlarged. Graded opacity in the RIGHT chest persists with central pulmonary vascular congestion. On limited assessment there is no acute skeletal process. IMPRESSION: 1. Interval placement of LEFT IJ central venous catheter with tip at the proximal portion of the SVC. No pneumothorax. 2. Persistent RIGHT-sided fusion extending into the major fissure. 3. Cardiomegaly with potential small  LEFT effusion as well. 4. Basilar atelectasis. Electronically Signed   By: Zetta Bills M.D.   On: 12/29/2020 15:32   DG Chest Port 1 View  Result Date: 12/25/2020 CLINICAL DATA:  Abnormal respirations. EXAM: PORTABLE CHEST 1 VIEW COMPARISON:  Chest x-ray 12/18/2020 FINDINGS: The heart size and mediastinal contours are unchanged. Redemonstration of prominent hilar vasculature. Left base linear atelectasis. No focal consolidation within the aerated lungs. No pulmonary edema. Interval increase in trace to small volume right pleural effusion. Persistent trace left pleural effusion. No pneumothorax. No acute osseous abnormality. IMPRESSION: 1. Slight interval increase of a right trace to small volume pleural effusion. 2. Grossly stable left trace pleural effusion. 3. Vascular congestion. Electronically Signed   By: Iven Finn M.D.   On: 12/25/2020 05:48   DG CHEST PORT 1 VIEW  Result Date: 12/18/2020 CLINICAL DATA:  Shortness of breath. EXAM: PORTABLE CHEST 1 VIEW COMPARISON:  Radiographs 12/16/2020 and 12/14/2020.  CT 05/14/2020. FINDINGS: 0918 hours. The heart size is stable at the upper limits of normal for portable technique. There is stable vascular congestion, bibasilar atelectasis and trace bilateral pleural effusions. No changes are seen compared with the recent prior studies. The bones appear unremarkable. Telemetry leads overlie the chest. IMPRESSION: Stable chest. Vascular congestion, bibasilar atelectasis and trace bilateral pleural effusions appear unchanged. Electronically Signed   By: Richardean Sale M.D.   On: 12/18/2020 09:29   DG Chest Port 1 View  Result Date: 12/16/2020 CLINICAL DATA:  Weakness, pleural effusions, LEFT lower quadrant abdominal pain, nausea, dry heaving, pleural effusions by prior radiograph EXAM: PORTABLE CHEST 1 VIEW COMPARISON:  Portable exam 1205 hours compared to 12/14/2020 FINDINGS: Borderline enlargement of cardiac silhouette with slight vascular congestion.  Mediastinal contours normal. Bibasilar atelectasis and tiny pleural effusions. No acute infiltrate/edema or pneumothorax. Bones demineralized. IMPRESSION: Enlargement of cardiac silhouette with pulmonary vascular congestion. Bibasilar atelectasis and tiny pleural effusions. Electronically Signed   By: Lavonia Dana M.D.   On: 12/16/2020 12:27   VAS [place] ABI WITH/WO TBI  Result Date: 01/04/2021 LOWER EXTREMITY DOPPLER STUDY Indications: Dusky Toes. High Risk Factors: Hypertension, hyperlipidemia.  Limitations: Today's exam was limited due to Left restricted arm. Comparison Study: No prior studies. Performing Technologist: Carlos Levering RVT  Examination Guidelines: A complete evaluation includes at minimum, Doppler waveform signals and systolic blood pressure reading at the level of bilateral brachial, anterior tibial, and posterior tibial arteries, when vessel segments are accessible. Bilateral testing is considered an integral part of a complete examination. Photoelectric Plethysmograph (PPG) waveforms and toe systolic pressure readings are included as required and additional duplex testing as needed. Limited examinations for reoccurring indications may be performed as noted.  ABI Findings: +---------+------------------+-----+---------+--------+ Right    Rt  Pressure (mmHg)IndexWaveform Comment  +---------+------------------+-----+---------+--------+ Brachial 141                    triphasic         +---------+------------------+-----+---------+--------+ PTA      203               1.44 triphasic         +---------+------------------+-----+---------+--------+ DP       153               1.09 triphasic         +---------+------------------+-----+---------+--------+ Great Toe0                 0.00                   +---------+------------------+-----+---------+--------+ +---------+------------------+-----+---------+--------------+ Left     Lt Pressure (mmHg)IndexWaveform Comment         +---------+------------------+-----+---------+--------------+ Brachial                                 Restricted arm +---------+------------------+-----+---------+--------------+ PTA      194               1.38 triphasic               +---------+------------------+-----+---------+--------------+ DP       147               1.04 triphasic               +---------+------------------+-----+---------+--------------+ Great Toe0                 0.00                         +---------+------------------+-----+---------+--------------+ +-------+-----------+-----------+------------+------------+ ABI/TBIToday's ABIToday's TBIPrevious ABIPrevious TBI +-------+-----------+-----------+------------+------------+ Right  1.44       0                                   +-------+-----------+-----------+------------+------------+ Left   1.38       0                                   +-------+-----------+-----------+------------+------------+  Summary: Right: Resting right ankle-brachial index indicates noncompressible right lower extremity arteries. The right toe-brachial index is abnormal. Left: Resting left ankle-brachial index indicates noncompressible left lower extremity arteries. The left toe-brachial index is abnormal.  *See table(s) above for measurements and observations.  Electronically signed by Harold Barban MD on 01/04/2021 at 8:00:58 PM.    Final    ECHOCARDIOGRAM COMPLETE  Result Date: 12/17/2020    ECHOCARDIOGRAM REPORT   Patient Name:   April [name] Axton Date of Exam: 12/17/2020 Medical Rec #:  [id number]         Height:       61.0 in Accession #:    [id number]        Weight:       167.0 lb Date of Birth:  [birth year]         BSA:          1.749 m Patient Age:    20 years          BP:           105/56 mmHg Patient Gender:  F                 HR:           98 bpm. Exam Location:  Inpatient Procedure: 2D Echo, Color Doppler and Cardiac Doppler Indications:    CHF-Acute Diastolic  X91.47  History:        Patient has prior history of Echocardiogram examinations, most                 recent 04/17/2020. CHF, COPD; Risk Factors:Hypertension,                 Dyslipidemia and Diabetes.  Sonographer:    Bernadene Person RDCS Referring Phys: [id number] Zena  1. Left ventricular ejection fraction, by estimation, is 70 to 75%. The left ventricle has hyperdynamic function. The left ventricle has no regional wall motion abnormalities. Left ventricular diastolic parameters are consistent with Grade II diastolic dysfunction (pseudonormalization). Elevated left atrial pressure.  2. Right ventricular systolic function is normal. The right ventricular size is normal. There is normal pulmonary artery systolic pressure.  3. The mitral valve is normal in structure. No evidence of mitral valve regurgitation. No evidence of mitral stenosis.  4. The aortic valve is tricuspid. Aortic valve regurgitation is not visualized. No aortic stenosis is present.  5. The inferior vena cava is normal in size with greater than 50% respiratory variability, suggesting right atrial pressure of 3 mmHg. FINDINGS  Left Ventricle: Left ventricular ejection fraction, by estimation, is 70 to 75%. The left ventricle has hyperdynamic function. The left ventricle has no regional wall motion abnormalities. The left ventricular internal cavity size was normal in size. There is no left ventricular hypertrophy. Left ventricular diastolic parameters are consistent with Grade II diastolic dysfunction (pseudonormalization). Elevated left atrial pressure. Right Ventricle: The right ventricular size is normal. Right ventricular systolic function is normal. There is normal pulmonary artery systolic pressure. The tricuspid regurgitant velocity is 2.51 m/s, and with an assumed right atrial pressure of 3 mmHg,  the estimated right ventricular systolic pressure is 30.8 mmHg. Left Atrium: Left atrial size was normal in size. Right  Atrium: Right atrial size was normal in size. Pericardium: There is no evidence of pericardial effusion. Mitral Valve: The mitral valve is normal in structure. Mild mitral annular calcification. No evidence of mitral valve regurgitation. No evidence of mitral valve stenosis. Tricuspid Valve: The tricuspid valve is normal in structure. Tricuspid valve regurgitation is trivial. No evidence of tricuspid stenosis. Aortic Valve: The aortic valve is tricuspid. Aortic valve regurgitation is not visualized. No aortic stenosis is present. Pulmonic Valve: The pulmonic valve was normal in structure. Pulmonic valve regurgitation is not visualized. No evidence of pulmonic stenosis. Aorta: The aortic root is normal in size and structure. Venous: The inferior vena cava is normal in size with greater than 50% respiratory variability, suggesting right atrial pressure of 3 mmHg. IAS/Shunts: No atrial level shunt detected by color flow Doppler.  LEFT VENTRICLE PLAX 2D LVIDd:         4.50 cm  Diastology LVIDs:         2.60 cm  LV e' medial:    4.68 cm/s LV PW:         0.90 cm  LV E/e' medial:  20.9 LV IVS:        0.90 cm  LV e' lateral:   9.68 cm/s LVOT diam:     2.00 cm  LV E/e' lateral: 10.1 LV SV:  79 LV SV Index:   45 LVOT Area:     3.14 cm  RIGHT VENTRICLE RV S prime:     13.70 cm/s TAPSE (M-mode): 1.5 cm LEFT ATRIUM           Index       RIGHT ATRIUM           Index LA diam:      4.50 cm 2.57 cm/m  RA Area:     12.10 cm LA Vol (A2C): 46.3 ml 26.47 ml/m RA Volume:   23.40 ml  13.38 ml/m LA Vol (A4C): 51.8 ml 29.61 ml/m  AORTIC VALVE LVOT Vmax:   187.00 cm/s LVOT Vmean:  134.000 cm/s LVOT VTI:    0.253 m  AORTA Ao Root diam: 3.40 cm Ao Asc diam:  3.40 cm MITRAL VALVE               TRICUSPID VALVE MV Area (PHT): 2.60 cm    TR Peak grad:   25.2 mmHg MV Decel Time: 292 msec    TR Vmax:        251.00 cm/s MV E velocity: 97.90 cm/s MV A velocity: 92.40 cm/s  SHUNTS MV E/A ratio:  1.06        Systemic VTI:  0.25 m                             Systemic Diam: 2.00 cm Kirk Ruths MD Electronically signed by Kirk Ruths MD Signature Date/Time: 12/17/2020/11:23:38 AM    Final    ECHO TEE  Result Date: 12/25/2020    TRANSESOPHOGEAL ECHO REPORT   Patient Name:   April [name] Callanan Date of Exam: 12/25/2020 Medical Rec #:  [id number]         Height:       61.0 in Accession #:    [id number]        Weight:       173.1 lb Date of Birth:  03-21-1956         BSA:          1.776 m Patient Age:    66 years          BP:           127/89 mmHg Patient Gender: F                 HR:           116 bpm. Exam Location:  Inpatient Procedure: 3D Echo, Transesophageal Echo, Cardiac Doppler and Color Doppler Indications:     I48.0 Paroxysmal atrial fibrillation  History:         Patient has prior history of Echocardiogram examinations, most                  recent 12/17/2020. CHF, COPD; Risk Factors:Hypertension,                  Dyslipidemia and GERD.  Sonographer:     Tiffany Dance Referring Phys:  [id number] Abigail Butts Diagnosing Phys: Eleonore Chiquito MD PROCEDURE: After discussion of the risks and benefits of a TEE, an informed consent was obtained from the patient. TEE procedure time was 18 minutes. The transesophogeal probe was passed without difficulty through the esophogus of the patient. Imaged were obtained with the patient in a left lateral decubitus position. Local oropharyngeal anesthetic was provided with Cetacaine. Sedation performed by different physician. The patient was monitored while under  deep sedation. Anesthestetic sedation was provided intravenously by Anesthesiology: 173mg  of Propofol. Image quality was excellent. The patient's vital signs; including heart rate, blood pressure, and oxygen saturation; remained stable throughout the procedure. The patient developed no complications during the procedure. A successful direct current cardioversion was performed at 120 joules with 1 attempt. IMPRESSIONS  1. Atrial flutter. No LA/LAA  thrombus. Successful DCCV x 1 with 120 J and return to NSR.  2. Left ventricular ejection fraction, by estimation, is 55 to 60%. The left ventricle has normal function.  3. Right ventricular systolic function is normal. The right ventricular size is normal.  4. No left atrial/left atrial appendage thrombus was detected. The LAA emptying velocity was 75 cm/s.  5. The mitral valve is normal in structure. Mild mitral valve regurgitation. No evidence of mitral stenosis.  6. Tricuspid valve regurgitation is mild to moderate.  7. The aortic valve is tricuspid. Aortic valve regurgitation is not visualized. No aortic stenosis is present.  8. There is mild (Grade II) layered plaque involving the descending aorta. FINDINGS  Left Ventricle: Left ventricular ejection fraction, by estimation, is 55 to 60%. The left ventricle has normal function. The left ventricular internal cavity size was normal in size. Right Ventricle: The right ventricular size is normal. No increase in right ventricular wall thickness. Right ventricular systolic function is normal. Left Atrium: Left atrial size was normal in size. No left atrial/left atrial appendage thrombus was detected. The LAA emptying velocity was 75 cm/s. Right Atrium: Right atrial size was normal in size. Pericardium: There is no evidence of pericardial effusion. Presence of pericardial fat pad. Mitral Valve: The mitral valve is normal in structure. Mild mitral valve regurgitation. No evidence of mitral valve stenosis. Tricuspid Valve: The tricuspid valve is normal in structure. Tricuspid valve regurgitation is mild to moderate. No evidence of tricuspid stenosis. Aortic Valve: The aortic valve is tricuspid. Aortic valve regurgitation is not visualized. No aortic stenosis is present. Pulmonic Valve: The pulmonic valve was grossly normal. Pulmonic valve regurgitation is not visualized. No evidence of pulmonic stenosis. Aorta: The aortic root and ascending aorta are structurally  normal, with no evidence of dilitation. There is mild (Grade II) layered plaque involving the descending aorta. Venous: The left upper pulmonary vein, right upper pulmonary vein, left lower pulmonary vein and right lower pulmonary vein are normal. IAS/Shunts: The atrial septum is grossly normal.   AORTA Ao Root diam: 3.68 cm Ao Asc diam:  3.47 cm TRICUSPID VALVE TR Peak grad:   35.0 mmHg TR Vmax:        296.00 cm/s Eleonore Chiquito MD Electronically signed by Eleonore Chiquito MD Signature Date/Time: 12/25/2020/3:31:25 PM    Final    VAS [place] LOWER EXTREMITY VENOUS (DVT)  Result Date: 12/17/2020  Lower Venous DVT Study Indications: Swelling L>R.  Risk Factors: Limited mobility due to O2 dependence. Comparison Study: Previous exam - 01/14/20 (negative) Performing Technologist: Rogelia Rohrer  Examination Guidelines: A complete evaluation includes B-mode imaging, spectral Doppler, color Doppler, and power Doppler as needed of all accessible portions of each vessel. Bilateral testing is considered an integral part of a complete examination. Limited examinations for reoccurring indications may be performed as noted. The reflux portion of the exam is performed with the patient in reverse Trendelenburg.  +---------+---------------+---------+-----------+----------+-------------------+ RIGHT    CompressibilityPhasicitySpontaneityPropertiesThrombus Aging      +---------+---------------+---------+-----------+----------+-------------------+ CFV      Full           Yes      Yes                                      +---------+---------------+---------+-----------+----------+-------------------+  SFJ      Full                                                             +---------+---------------+---------+-----------+----------+-------------------+ FV Prox  Full           Yes      Yes                                      +---------+---------------+---------+-----------+----------+-------------------+ FV Mid   Full            Yes      Yes                                      +---------+---------------+---------+-----------+----------+-------------------+ FV DistalFull           Yes      Yes                                      +---------+---------------+---------+-----------+----------+-------------------+ PFV      Full                                                             +---------+---------------+---------+-----------+----------+-------------------+ POP      Partial        Yes      Yes                  Age Indeterminate   +---------+---------------+---------+-----------+----------+-------------------+ PTV      Full                                         Not well visualized +---------+---------------+---------+-----------+----------+-------------------+ PERO     Full                                                             +---------+---------------+---------+-----------+----------+-------------------+   +---------+---------------+---------+-----------+----------+-------------------+ LEFT     CompressibilityPhasicitySpontaneityPropertiesThrombus Aging      +---------+---------------+---------+-----------+----------+-------------------+ CFV      Full           Yes      Yes                                      +---------+---------------+---------+-----------+----------+-------------------+ SFJ      Full                                                             +---------+---------------+---------+-----------+----------+-------------------+  FV Prox  Full           Yes      Yes                                      +---------+---------------+---------+-----------+----------+-------------------+ FV Mid   Full           Yes      Yes                                      +---------+---------------+---------+-----------+----------+-------------------+ FV DistalFull           Yes      Yes                                       +---------+---------------+---------+-----------+----------+-------------------+ PFV      Full                                                             +---------+---------------+---------+-----------+----------+-------------------+ POP      Full           Yes      Yes                                      +---------+---------------+---------+-----------+----------+-------------------+ PTV      Full                                         Not well visualized +---------+---------------+---------+-----------+----------+-------------------+ PERO     Full                                                             +---------+---------------+---------+-----------+----------+-------------------+     Summary: BILATERAL: -No evidence of popliteal cyst, bilaterally. RIGHT: - Findings consistent with age indeterminate, non occlusive deep vein thrombosis involving the right popliteal vein. Subcutaneous edema  LEFT: - There is no evidence of deep vein thrombosis in the lower extremity.  Subcutaneous edema.  *See table(s) above for measurements and observations. Electronically signed by Deitra Mayo MD on 12/17/2020 at 7:47:55 AM.    Final    [place] EKG SITE RITE  Result Date: 12/23/2020 If Site Rite image not attached, placement could not be confirmed due to current cardiac rhythm.       The results of significant diagnostics from this hospitalization (including imaging, microbiology, ancillary and laboratory) are listed below for reference.     Microbiology: Recent Results (from the past 240 hour(s))  SARS CORONAVIRUS 2 (TAT 6-24 HRS) Nasopharyngeal Nasopharyngeal Swab     Status: None   Collection Time: 01/14/21 12:39 AM   Specimen: Nasopharyngeal Swab  Result Value Ref Range Status   SARS Coronavirus 2  NEGATIVE NEGATIVE Final    Comment: (NOTE) SARS-CoV-2 target nucleic acids are NOT DETECTED.  The SARS-CoV-2 RNA is generally detectable in upper and lower respiratory  specimens during the acute phase of infection. Negative results do not preclude SARS-CoV-2 infection, do not rule out co-infections with other pathogens, and should not be used as the sole basis for treatment or other patient management decisions. Negative results must be combined with clinical observations, patient history, and epidemiological information. The expected result is Negative.  Fact Sheet for Patients: SugarRoll.be  Fact Sheet for Healthcare Providers: https://www.woods-mathews.com/  This test is not yet approved or cleared by the [place] FDA and  has been authorized for detection and/or diagnosis of SARS-CoV-2 by FDA under an Emergency Use Authorization (EUA). This EUA will remain  in effect (meaning this test can be used) for the duration of the COVID-19 declaration under Se ction 564(b)(1) of the Act, 21 U.S.C. section 360bbb-3(b)(1), unless the authorization is terminated or revoked sooner.  Performed at [facility], [place] [address], [place], [place] [address]      Labs:  CBC: Recent Labs  Lab 01/10/21 [phone] 01/11/21 0023 01/12/21 0250 01/13/21 0049 01/14/21 0520  WBC 4.4 3.1* 2.5* 2.6* 3.2*  HGB 9.5* 8.9* 8.1* 8.8* 8.9*  HCT 30.0* 28.1* 25.2* 27.9* 27.5*  MCV 102.0* 103.3* 102.9* 103.3* 100.4*  PLT 91* 122* 100* 139* 180   BMP &GFR Recent Labs  Lab 01/09/21 0511 01/10/21 0438 01/11/21 0023 01/12/21 0250 01/13/21 0049 01/14/21 0520  NA 136 136 134* 131* 132*  --   K 4.3 4.8 4.2 3.9 4.1  --   CL 101 102 98 96* 98  --   CO2 26 26 28 28 25   --   GLUCOSE 138* 141* 251* 189* 258*  --   BUN 76* 72* 64* 54* 48*  --   CREATININE 1.42* 1.19* 1.20* 1.03* 0.97  --   CALCIUM 9.0 9.0 8.9 8.7* 8.8*  --   MG 1.7 1.6* 1.5* 1.8 1.6* 2.2  PHOS 3.0 2.5 2.1* 1.8* 2.0*  --    Estimated Creatinine Clearance: 54 mL/min (by C-G formula based on SCr of 0.97 mg/dL). Liver & Pancreas: Recent Labs  Lab  01/08/21 1021 01/09/21 0511 01/10/21 0438 01/11/21 0023 01/12/21 0250 01/13/21 0049  AST 10*  --   --   --   --   --   ALT 8  --   --   --   --   --   ALKPHOS 62  --   --   --   --   --   BILITOT 1.5*  --   --   --   --   --   PROT 5.4*  --   --   --   --   --   ALBUMIN 2.3* 2.2* 2.2* 2.1* 1.9* 2.0*   No results for input(s): LIPASE, AMYLASE in the last 168 hours. No results for input(s): AMMONIA in the last 168 hours. Diabetic: No results for input(s): HGBA1C in the last 72 hours. Recent Labs  Lab 01/13/21 1147 01/13/21 1618 01/13/21 2122 01/14/21 0747 01/14/21 1150  GLUCAP 144* 196* 190* 188* 233*   Cardiac Enzymes: No results for input(s): CKTOTAL, CKMB, CKMBINDEX, TROPONINI in the last 168 hours. No results for input(s): PROBNP in the last 8760 hours. Coagulation Profile: Recent Labs  Lab 01/09/21 1113  INR 1.2   Thyroid Function Tests: No results for input(s): TSH, T4TOTAL, FREET4, T3FREE, THYROIDAB in  the last 72 hours. Lipid Profile: No results for input(s): CHOL, HDL, LDLCALC, TRIG, CHOLHDL, LDLDIRECT in the last 72 hours. Anemia Panel: No results for input(s): VITAMINB12, FOLATE, FERRITIN, TIBC, IRON, RETICCTPCT in the last 72 hours. Urine analysis:    Component Value Date/Time   COLORURINE YELLOW 12/24/2020 1700   APPEARANCEUR CLEAR 12/24/2020 1700   LABSPEC 1.015 12/24/2020 1700   PHURINE 5.0 12/24/2020 1700   GLUCOSEU 50 (A) 12/24/2020 1700   HGBUR NEGATIVE 12/24/2020 1700   BILIRUBINUR NEGATIVE 12/24/2020 1700   KETONESUR NEGATIVE 12/24/2020 1700   PROTEINUR NEGATIVE 12/24/2020 1700   UROBILINOGEN 0.2 05/15/2013 0733   NITRITE NEGATIVE 12/24/2020 1700   LEUKOCYTESUR NEGATIVE 12/24/2020 1700   Sepsis Labs: Invalid input(s): PROCALCITONIN, LACTICIDVEN   Time coordinating discharge: 45 minutes  SIGNED:  Mercy Riding, MD  [facility] 01/14/2021, 1:36 PM  If 7PM-7AM, please contact night-coverage [email or web address]

## 2021-01-14 NOTE — TOC Transition Note (Signed)
Transition of Care [facility]) - CM/SW Discharge Note   Patient Details  Name: April [name] MRN: [id number] Date of Birth: 11-30-1955  Transition of Care [facility]) CM/SW Contact:  [name] Ludwig, LCSW Phone Number: 01/14/2021, 2:18 PM   Clinical Narrative:     CSW spoke to patient and daughter, they are aware that patient has been approved for SNF.  Patient and daughter expressed being nervous about going to SNF.  CSW listened to what they had to say, they expressed they had a bad experience at a previous SNF.  CSW encouraged them to express concerns if they have any while at the SNF.  CSW updated bedside nurse that patient can discharge today.  Patient to be d/c'ed today to [facility].  Patient and family agreeable to plans will transport via ems RN to call report to [phone].  Patient's daughter was notified of planned discharge by bedside nurse.      Final next level of care: Skilled Nursing Facility Barriers to Discharge: Barriers Resolved   Patient Goals and CMS Choice Patient states their goals for this hospitalization and ongoing recovery are:: To go to SNF for rehab. CMS Medicare.gov Compare Post Acute Care list provided to:: Patient Choice offered to / list presented to : Patient  Discharge Placement   Existing PASRR number confirmed : 01/13/21          Patient chooses bed at: [facility] Patient to be transferred to facility by: [facility] Name of family member notified: Patient's daughter April [name] Patient and family notified of of transfer: 01/14/21  Discharge Plan and Services   Discharge Planning Services: CM Consult                                 Social Determinants of Health (SDOH) Interventions     Readmission Risk Interventions Readmission Risk Prevention Plan 05/15/2020 04/19/2020 02/09/2020  Transportation Screening Complete Complete Complete  PCP or Specialist Appt within 3-5 Days - - -  Not Complete comments - - -  HRI or  [place] or Home Care Consult comments - - -  Social Work Consult for [place] Planning/Counseling - - -  SW consult not completed comments - - -  Palliative Care Screening - - -  Medication Review ([place]) Referral to Pharmacy Complete Referral to Pharmacy  PCP or Specialist appointment within 3-5 days of discharge Complete Complete Complete  HRI or Home Care Consult Complete Complete Complete  SW Recovery Care/Counseling Consult Complete Complete -  Palliative Care Screening Not Applicable Not Applicable Complete  Skilled Nursing Facility Complete Complete Patient Refused  Some recent data might be hidden

## 2021-01-14 NOTE — Progress Notes (Signed)
Report called to [place] at [employer]. Daughter, [name], updated as to patient discharging today.

## 2021-01-14 NOTE — TOC Progression Note (Signed)
Transition of Care [facility]) - Progression Note    Patient Details  Name: April [name] MRN: [id number] Date of Birth: 06/04/1956  Transition of Care [facility]) CM/SW Contact  [name] Ludwig, [place] Phone Number: 01/14/2021, 1:20 PM  Clinical Narrative:     CSW spoke to patient and her daughter [name] regarding SNF bed offers.  Per patient and her daughter they have chosen [facility].  CSW contacted [facility], they can accept patient if she is ready for discharge today.  Insurance authorization has been approved, CSW to continue to follow patient's progress throughout discharge planning.   Expected Discharge Plan: Home/Self Care Barriers to Discharge: Continued Medical Work up  Expected Discharge Plan and Services Expected Discharge Plan: Home/Self Care   Discharge Planning Services: CM Consult   Living arrangements for the past 2 months: Single Family Home                                       Social Determinants of Health (SDOH) Interventions    Readmission Risk Interventions Readmission Risk Prevention Plan 05/15/2020 04/19/2020 02/09/2020  Transportation Screening Complete Complete Complete  PCP or Specialist Appt within 3-5 Days - - -  Not Complete comments - - -  HRI or [place] or Home Care Consult comments - - -  Social Work Consult for [place] Planning/Counseling - - -  SW consult not completed comments - - -  Palliative Care Screening - - -  Medication Review ([place]) Referral to Pharmacy Complete Referral to Pharmacy  PCP or Specialist appointment within 3-5 days of discharge Complete Complete Complete  HRI or Home Care Consult Complete Complete Complete  SW Recovery Care/Counseling Consult Complete Complete -  Palliative Care Screening Not Applicable Not Applicable Complete  Skilled Nursing Facility Complete Complete Patient Refused  Some recent data might be hidden

## 2021-01-14 NOTE — Progress Notes (Signed)
Pt refusing CPAP QHS at this time.  

## 2021-01-15 NOTE — Progress Notes (Deleted)
Cardiology Clinic Note   Patient Name: April [name] Date of Encounter: 01/15/2021  Primary Care Provider:  [name], MD Primary Cardiologist:  Sanda Klein, MD  Patient Profile    April [name] 65 year old female presents the clinic today for follow-up evaluation of her atrial flutter status post DCCV (120 J x1 with return to NSR.)  Past Medical History    Past Medical History:  Diagnosis Date  . Arthritis   . Asthma   . CHF (congestive heart failure) ([place])   . COPD (chronic obstructive pulmonary disease) ([place])   . Depression   . Essential hypertension 02/08/2020  . GERD (gastroesophageal reflux disease)   . Hyperlipidemia   . Hyperparathyroidism   . Polycystic kidney   . PONV (postoperative nausea and vomiting)    Past Surgical History:  Procedure Laterality Date  . 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 BILATERAL OOPHORECTOMY    . BIOPSY  05/19/2020   Procedure: BIOPSY;  Surgeon: Carol Ada, MD;  Location: [place];  Service: Endoscopy;;  . CARDIOVERSION N/A 12/25/2020   Procedure: CARDIOVERSION;  Surgeon: Geralynn Rile, MD;  Location: [place];  Service: Cardiovascular;  Laterality: N/A;  . CARPAL TUNNEL RELEASE  2000  . CESAREAN SECTION    . COLONOSCOPY WITH PROPOFOL N/A 05/19/2020   Procedure: COLONOSCOPY WITH PROPOFOL;  Surgeon: Carol Ada, MD;  Location: [place];  Service: Endoscopy;  Laterality: N/A;  . ENTEROSCOPY N/A 05/19/2020   Procedure: ENTEROSCOPY;  Surgeon: Carol Ada, MD;  Location: [place];  Service: Endoscopy;  Laterality: N/A;  . INSERTION OF DIALYSIS CATHETER  03/31/2012   Procedure: INSERTION OF DIALYSIS CATHETER;  Surgeon: Angelia Mould, MD;  Location: [place];  Service: Vascular;  Laterality: N/A;  insertion of dialysis catheter right internal jugular vein  . 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 LAPAROTOMY N/A 12/29/2020   Procedure: EXPLORATORY LAPAROTOMY, SIGMOID  COLECTOMY AND COLOSTOMY;  Surgeon: Coralie Keens, MD;  Location: [facility];  Service: General;  Laterality: N/A;  Packed Dressing  . ORIF TIBIA FRACTURE Left 12/23/2018   Procedure: OPEN REDUCTION INTERNAL FIXATION (ORIF) TIBIA FRACTURE;  Surgeon: Shona Needles, MD;  Location: [place];  Service: Orthopedics;  Laterality: Left;  . ORIF TIBIA PLATEAU Right 12/23/2018   Procedure: OPEN REDUCTION INTERNAL FIXATION (ORIF) TIBIAL PLATEAU;  Surgeon: Shona Needles, MD;  Location: [place];  Service: Orthopedics;  Laterality: Right;  . ORIF WRIST FRACTURE Left 12/08/2019   Procedure: OPEN REDUCTION INTERNAL FIXATION (ORIF) WRIST FRACTURE, REPAIR LACERATION LEFT FOREARM;  Surgeon: Dorna Leitz, MD;  [facility];  Service: Orthopedics;  Laterality: Left;  . TEE WITHOUT CARDIOVERSION N/A 12/25/2020   Procedure: TRANSESOPHAGEAL ECHOCARDIOGRAM (TEE);  Surgeon: Geralynn Rile, MD;  Location: [place];  Service: Cardiovascular;  Laterality: N/A;  . [place]  . TOTAL HIP ARTHROPLASTY Left 12/08/2019   Procedure: TOTAL HIP ARTHROPLASTY ANTERIOR APPROACH;  Surgeon: Dorna Leitz, MD;  Location: [facility];  Service: Orthopedics;  Laterality: Left;  . TUBAL LIGATION  2010    Allergies  Allergies  Allergen Reactions  . Infed [Iron Dextran] Other (See Comments)    Chest tightness  . Pentamidine Itching, Shortness Of Breath and Swelling  . Budesonide-Formoterol Fumarate     Other reaction(s): hoarseness  . Bupropion     Other reaction(s): exacerbated depression  . Crestor [Rosuvastatin]     Other reaction(s): body aches, swelling  . Duloxetine Hcl  Other reaction(s): GI SE, weepy, worsening fatigue, foggy  . Erythromycin [Erythromycin] Other (See Comments)    Mouth Ulcers  . Iohexol Other (See Comments)    Per patient "she has had a kidney transplant and should never have contrast"  . Oxycodone Nausea Only and Nausea And Vomiting  . Pravastatin Sodium     Other reaction(s): myalgias   . Erythromycin Rash    Causes breakout in mouth  . Ultram [Tramadol Hcl] Anxiety    History of Present Illness    Ms. Shrode has a PMH of atrial flutter.  Status post successful DCCV with 120 J x 1 and return to normal sinus rhythm.  Her TEE showed an EF of 55-60%, no LA/LAA thrombus, tricuspid valve with mild-moderate regurgitation, and grade 2 layered plaque descending aorta.  Her PMH also includes essential hypertension, chronic diastolic CHF, pericardial effusion, COPD, asthma, GERD, CKD stage III, hyperlipidemia, and anxiety.  She presents the clinic today for follow-up evaluation and states***.  *** denies chest pain, shortness of breath, lower extremity edema, fatigue, palpitations, melena, hematuria, hemoptysis, diaphoresis, weakness, presyncope, syncope, orthopnea, and PND.   Home Medications    Prior to Admission medications   Medication Sig Start Date End Date Taking? Authorizing Provider  acetaminophen (TYLENOL) 325 MG tablet Take 2 tablets (650 mg total) by mouth every 6 (six) hours as needed for mild pain (or Fever >/= 101). 05/22/20   Elgergawy, Silver Huguenin, MD  albuterol (VENTOLIN HFA) 108 (90 Base) MCG/ACT inhaler Inhale 2 puffs into the lungs every 4 (four) hours as needed for wheezing or shortness of breath. 01/04/20   Angiulli, Lavon Paganini, PA-C  allopurinol (ZYLOPRIM) 100 MG tablet Take 100 mg by mouth daily. 11/29/20   [provider]  amLODipine (NORVASC) 5 MG tablet Take 1 tablet (5 mg total) by mouth daily. 01/15/21   Mercy Riding, MD  apixaban (ELIQUIS) 5 MG TABS tablet Take 1 tablet (5 mg total) by mouth 2 (two) times daily. 01/14/21   Mercy Riding, MD  brimonidine (ALPHAGAN P) 0.1 % SOLN Place 1 drop into both eyes 2 (two) times daily.     [provider]  Budeson-Glycopyrrol-Formoterol (BREZTRI AEROSPHERE) 160-9-4.8 MCG/ACT AERO Inhale 2 puffs into the lungs 2 (two) times daily. 08/20/20   Martyn Ehrich, NP  cholecalciferol (VITAMIN D3) 25 MCG  (1000 UNIT) tablet Take 1 tablet (1,000 Units total) by mouth daily. 01/04/20   Angiulli, Lavon Paganini, PA-C  colchicine 0.6 MG tablet Take 0.5 tablets (0.3 mg total) by mouth daily. 01/15/21   Mercy Riding, MD  cyanocobalamin 1000 MCG tablet Take 1,000 mcg by mouth daily.    [provider]  cycloSPORINE (RESTASIS) 0.05 % ophthalmic emulsion Place 1 drop into both eyes 2 (two) times daily. 01/04/20   Angiulli, Lavon Paganini, PA-C  dorzolamide (TRUSOPT) 2 % ophthalmic solution Place 1 drop into both eyes daily. 06/25/19   [provider]  feeding supplement (ENSURE ENLIVE / ENSURE PLUS) LIQD Take 237 mLs by mouth 2 (two) times daily between meals. 01/14/21   Mercy Riding, MD  ferrous sulfate 325 (65 FE) MG tablet Take 325 mg by mouth daily with breakfast.    [provider]  FLUoxetine (PROZAC) 40 MG capsule Take 1 capsule (40 mg total) by mouth daily. 01/04/20   Angiulli, Lavon Paganini, PA-C  furosemide (LASIX) 40 MG tablet Take 1 tablet (40 mg total) by mouth 2 (two) times daily. 01/14/21   Mercy Riding, MD  gabapentin (NEURONTIN) 100 MG capsule Take 200 mg by mouth 2 (two) times daily.    [provider]  guaiFENesin (MUCINEX) 600 MG 12 hr tablet Take 600 mg by mouth 2 (two) times daily.    [provider]  insulin aspart (NOVOLOG) 100 UNIT/ML injection Inject 0-9 Units into the skin 3 (three) times daily with meals. CBG 70 - 120: 0 units CBG 121 - 150: 1 unit CBG 151 - 200: 2 units CBG 201 - 250: 3 units CBG 251 - 300: 5 units CBG 301 - 350: 7 units CBG 351 - 400: 9 units 01/14/21   Gonfa, Taye T, MD  insulin glargine (LANTUS) 100 UNIT/ML injection Inject 0.05 mLs (5 Units total) into the skin 2 (two) times daily. 01/14/21   Mercy Riding, MD  ipratropium-albuterol (DUONEB) 0.5-2.5 (3) MG/3ML SOLN Take 3 mLs by nebulization every 6 (six) hours as needed (sob/wheezing).  08/24/20   [provider]  metoprolol tartrate (LOPRESSOR) 25 MG tablet Take 1 tablet (25  mg total) by mouth 2 (two) times daily. 01/14/21   Mercy Riding, MD  mycophenolate (MYFORTIC) 180 MG EC tablet Take 180 mg by mouth 2 (two) times daily.    [provider]  Nutritional Supplements (,FEEDING SUPPLEMENT, PROSOURCE PLUS) liquid Take 30 mLs by mouth 2 (two) times daily between meals. 01/14/21   Mercy Riding, MD  ondansetron (ZOFRAN) 4 MG tablet Take 1 tablet (4 mg total) by mouth every 8 (eight) hours as needed for nausea or vomiting. 01/14/21 01/14/22  Mercy Riding, MD  oxyCODONE (OXY IR/ROXICODONE) 5 MG immediate release tablet Take 1 tablet (5 mg total) by mouth every 6 (six) hours as needed for moderate pain or severe pain (Unresponsive to acetaminophen). 01/14/21   Mercy Riding, MD  pantoprazole (PROTONIX) 40 MG tablet Take 1 tablet (40 mg total) by mouth daily. 01/15/21   Mercy Riding, MD  predniSONE (DELTASONE) 2.5 MG tablet Take 3 tablets (7.5 mg total) by mouth daily with breakfast. 01/15/21   Mercy Riding, MD  PROGRAF 1 MG capsule Take 1 mg by mouth 2 (two) times daily.  03/22/19   [provider]  promethazine (PHENERGAN) 25 MG tablet Take 1 tablet (25 mg total) by mouth every 8 (eight) hours as needed for up to 15 doses for nausea or vomiting. 09/02/20   Curatolo, Adam, DO  sodium hypochlorite (DAKIN'S 1/4 STRENGTH) 0.125 % SOLN Irrigate with as directed 2 (two) times daily. 01/14/21   Mercy Riding, MD  TRAVATAN Z 0.004 % SOLN ophthalmic solution Place 1 drop into both eyes at bedtime. 07/03/16   [provider]  valACYclovir (VALTREX) 500 MG tablet Take 500 mg by mouth 2 (two) times daily.    [provider]    Family History    Family History  Problem Relation Age of Onset  . Hypertension Mother   . Heart disease Father        CABG history  . Polycystic kidney disease Father   . Polycystic kidney disease Brother    She indicated that the status of her mother is unknown. She indicated that the status of her father is unknown. She  indicated that the status of her brother is unknown.  Social History    Social History   Socioeconomic History  . Marital status: Married    Spouse name: Not on file  . Number of children: Not on file  . Years of education: Not on  file  . Highest education level: Not on file  Occupational History  . Occupation: retired  Tobacco Use  . Smoking status: Former Smoker    Packs/day: 0.25    Years: 15.00    Pack years: 3.75    Types: Cigarettes    Quit date: 03/22/1998    Years since quitting: 22.8  . Smokeless tobacco: Never Used  Vaping Use  . Vaping Use: Never used  Substance and Sexual Activity  . Alcohol use: No  . Drug use: No  . Sexual activity: Not on file  Other Topics Concern  . Not on file  Social History Narrative   Married, lives with spouse, son and mother   2 children > son and daughter   OCCUPATION: retired Education officer, museum   Social Determinants of Radio broadcast assistant Strain: Not on Art therapist Insecurity: Not on file  Transportation Needs: Not on file  Physical Activity: Not on file  Stress: Not on file  Social Connections: Not on file  Intimate Partner Violence: Not on file     Review of Systems    General:  No chills, fever, night sweats or weight changes.  Cardiovascular:  No chest pain, dyspnea on exertion, edema, orthopnea, palpitations, paroxysmal nocturnal dyspnea. Dermatological: No rash, lesions/masses Respiratory: No cough, dyspnea Urologic: No hematuria, dysuria Abdominal:   No nausea, vomiting, diarrhea, bright red blood per rectum, melena, or hematemesis Neurologic:  No visual changes, wkns, changes in mental status. All other systems reviewed and are otherwise negative except as noted above.  Physical Exam    VS:  There were no vitals taken for this visit. , BMI There is no height or weight on file to calculate BMI. GEN: Well nourished, well developed, in no acute distress. HEENT: normal. Neck: Supple, no JVD, carotid bruits,  or masses. Cardiac: RRR, no murmurs, rubs, or gallops. No clubbing, cyanosis, edema.  Radials/DP/PT 2+ and equal bilaterally.  Respiratory:  Respirations regular and unlabored, clear to auscultation bilaterally. GI: Soft, nontender, nondistended, BS + x 4. MS: no deformity or atrophy. Skin: warm and dry, no rash. Neuro:  Strength and sensation are intact. Psych: Normal affect.  Accessory Clinical Findings    Recent Labs: 12/24/2020: B Natriuretic Peptide 1,624.9 12/26/2020: TSH 0.385 01/08/2021: ALT 8 01/13/2021: BUN 48; Creatinine, Ser 0.97; Potassium 4.1; Sodium 132 01/14/2021: Hemoglobin 8.9; Magnesium 2.2; Platelets 180   Recent Lipid Panel    Component Value Date/Time   CHOL  01/30/2010 0620    192        ATP III CLASSIFICATION:  <200     mg/dL   Desirable  200-239  mg/dL   Borderline High  >=240    mg/dL   High          TRIG 93 12/30/2020 0345   HDL 34 (L) 01/30/2010 0620   CHOLHDL 5.6 01/30/2010 0620   VLDL 27 01/30/2010 0620   LDLCALC (H) 01/30/2010 0620    131        Total Cholesterol/HDL:CHD Risk Coronary Heart Disease Risk Table                     Men   Women  1/2 Average Risk   3.4   3.3  Average Risk       5.0   4.4  2 X Average Risk   9.6   7.1  3 X Average Risk  23.4   11.0        Use  the calculated Patient Ratio above and the CHD Risk Table to determine the patient's CHD Risk.        ATP III CLASSIFICATION (LDL):  <100     mg/dL   Optimal  100-129  mg/dL   Near or Above                    Optimal  130-159  mg/dL   Borderline  160-189  mg/dL   High  >190     mg/dL   Very High    ECG personally reviewed by me today- *** - No acute changes  TEE 12/25/2020  IMPRESSIONS    1. Atrial flutter. No LA/LAA thrombus. Successful DCCV x 1 with 120 J and  return to NSR.  2. Left ventricular ejection fraction, by estimation, is 55 to 60%. The  left ventricle has normal function.  3. Right ventricular systolic function is normal. The right ventricular   size is normal.  4. No left atrial/left atrial appendage thrombus was detected. The LAA  emptying velocity was 75 cm/s.  5. The mitral valve is normal in structure. Mild mitral valve  regurgitation. No evidence of mitral stenosis.  6. Tricuspid valve regurgitation is mild to moderate.  7. The aortic valve is tricuspid. Aortic valve regurgitation is not  visualized. No aortic stenosis is present.  8. There is mild (Grade II) layered plaque involving the descending  aorta.  Assessment & Plan   1.  Atrial flutter/A. fib with RVR -EKG today shows***.  Underwent successful DCCV 12/25/2020 with 1 shock 120 J and return to normal sinus rhythm.  Admitted to the hospital 12/17/2020 and discharged on 01/14/2021.  Noted to have acute abdominal pain post DCCV underwent CT which showed perforated diverticulitis.  Underwent laparotomy with Hartman's procedure but required mechanical ventilation and vasopressors postoperatively.  She was stabilized in the ICU and transferred back to [facility] service.  Noted to have slow return of bowel function, acute on chronic CKD, fluid volume overload and acute blood loss anemia.  Cardiology recommended anticoagulation at least for 30 days post DCCV 12/29/2020-01/28/2021.  Plan for elective ablation in the future and outpatient follow-up with [facility] EP team.  Case discussed with DOD.*** This patients CHA2DS2-VASc Score and unadjusted Ischemic Stroke Rate (% per year) is equal to 7.2 % stroke rate/year from a score of 5 [CHF, HTN, DM, Aortic Plaque, Female] Continue apixaban, metoprolol Heart healthy low-sodium diet-salty 6 given Increase physical activity as tolerated  Chronic diastolic CHF-euvolemic today.  TEE showed EF of 55-60%, mild-moderate mitral valve regurgitation, mild-moderate tricuspid valve regurgitation, and mild grade 2 descending aortic plaque. Continue furosemide, metoprolol Heart healthy low-sodium diet-salty 6 given Increase physical activity as  tolerated  Essential hypertension-BP today***.  Well-controlled at home. Continue metoprolol Heart healthy low-sodium diet-salty 6 given Increase physical activity as tolerated  Hyperlipidemia-12/30/2020: Triglycerides 93, LDL 131 on 01/30/2010. Heart healthy low-sodium diet-salty 6 given Increase physical activity as tolerated Follows with PCP  AKI on CKD stage III-creatinine 0.97 with GFR greater than 60. 01/13/2021 Follows with [place] nephrology  Disposition: Follow-up with Dr. Sallyanne Kuster as scheduled.   Jossie Ng. Aveleen Nevers NP-C    01/15/2021, 3:50 PM [facility] [place] [address] Office [phone] Fax [phone]  Notice: This dictation was prepared with Dragon dictation along with smaller phrase technology. Any transcriptional errors that result from this process are unintentional and may not be corrected upon review.  I spent***minutes examining this patient, reviewing medications, and  using patient centered shared decision making involving her cardiac care.  Prior to her visit I spent greater than 20 minutes reviewing her past medical history,  medications, and prior cardiac tests.

## 2021-01-16 ENCOUNTER — Ambulatory Visit: Payer: Medicare Other | Admitting: General Practice

## 2021-01-17 NOTE — Telephone Encounter (Signed)
Called [employer] [place] to check status, they still are awaiting income documents for redetermination.  Called patient, left message. Closing encounter- third attempt.

## 2021-01-20 ENCOUNTER — Other Ambulatory Visit: Payer: Self-pay

## 2021-01-20 ENCOUNTER — Inpatient Hospital Stay (HOSPITAL_COMMUNITY)
Admission: EM | Admit: 2021-01-20 | Discharge: 2021-02-21 | DRG: 862 | Disposition: E | Payer: Medicare Other | Attending: Internal Medicine | Admitting: Internal Medicine

## 2021-01-20 ENCOUNTER — Encounter (HOSPITAL_COMMUNITY): Payer: Self-pay | Admitting: Emergency Medicine

## 2021-01-20 ENCOUNTER — Emergency Department (HOSPITAL_COMMUNITY): Payer: Medicare Other

## 2021-01-20 DIAGNOSIS — I483 Typical atrial flutter: Secondary | ICD-10-CM | POA: Diagnosis present

## 2021-01-20 DIAGNOSIS — I82431 Acute embolism and thrombosis of right popliteal vein: Secondary | ICD-10-CM

## 2021-01-20 DIAGNOSIS — I5033 Acute on chronic diastolic (congestive) heart failure: Secondary | ICD-10-CM | POA: Diagnosis present

## 2021-01-20 DIAGNOSIS — R339 Retention of urine, unspecified: Secondary | ICD-10-CM | POA: Diagnosis not present

## 2021-01-20 DIAGNOSIS — R6521 Severe sepsis with septic shock: Secondary | ICD-10-CM | POA: Diagnosis present

## 2021-01-20 DIAGNOSIS — Q612 Polycystic kidney, adult type: Secondary | ICD-10-CM

## 2021-01-20 DIAGNOSIS — K651 Peritoneal abscess: Secondary | ICD-10-CM | POA: Diagnosis present

## 2021-01-20 DIAGNOSIS — A419 Sepsis, unspecified organism: Secondary | ICD-10-CM | POA: Diagnosis present

## 2021-01-20 DIAGNOSIS — D6959 Other secondary thrombocytopenia: Secondary | ICD-10-CM | POA: Diagnosis not present

## 2021-01-20 DIAGNOSIS — Z7952 Long term (current) use of systemic steroids: Secondary | ICD-10-CM

## 2021-01-20 DIAGNOSIS — T8143XA Infection following a procedure, organ and space surgical site, initial encounter: Secondary | ICD-10-CM | POA: Diagnosis present

## 2021-01-20 DIAGNOSIS — D509 Iron deficiency anemia, unspecified: Secondary | ICD-10-CM | POA: Diagnosis present

## 2021-01-20 DIAGNOSIS — N179 Acute kidney failure, unspecified: Secondary | ICD-10-CM | POA: Diagnosis present

## 2021-01-20 DIAGNOSIS — K632 Fistula of intestine: Secondary | ICD-10-CM | POA: Diagnosis present

## 2021-01-20 DIAGNOSIS — K219 Gastro-esophageal reflux disease without esophagitis: Secondary | ICD-10-CM | POA: Diagnosis present

## 2021-01-20 DIAGNOSIS — E11649 Type 2 diabetes mellitus with hypoglycemia without coma: Secondary | ICD-10-CM | POA: Diagnosis not present

## 2021-01-20 DIAGNOSIS — E2740 Unspecified adrenocortical insufficiency: Secondary | ICD-10-CM | POA: Diagnosis present

## 2021-01-20 DIAGNOSIS — Z888 Allergy status to other drugs, medicaments and biological substances status: Secondary | ICD-10-CM

## 2021-01-20 DIAGNOSIS — Z940 Kidney transplant status: Secondary | ICD-10-CM

## 2021-01-20 DIAGNOSIS — N1831 Chronic kidney disease, stage 3a: Secondary | ICD-10-CM | POA: Diagnosis present

## 2021-01-20 DIAGNOSIS — Z7189 Other specified counseling: Secondary | ICD-10-CM | POA: Diagnosis not present

## 2021-01-20 DIAGNOSIS — J4550 Severe persistent asthma, uncomplicated: Secondary | ICD-10-CM | POA: Diagnosis present

## 2021-01-20 DIAGNOSIS — Z6830 Body mass index (BMI) 30.0-30.9, adult: Secondary | ICD-10-CM

## 2021-01-20 DIAGNOSIS — L8990 Pressure ulcer of unspecified site, unspecified stage: Secondary | ICD-10-CM | POA: Insufficient documentation

## 2021-01-20 DIAGNOSIS — Z933 Colostomy status: Secondary | ICD-10-CM

## 2021-01-20 DIAGNOSIS — R531 Weakness: Secondary | ICD-10-CM | POA: Diagnosis not present

## 2021-01-20 DIAGNOSIS — T502X5A Adverse effect of carbonic-anhydrase inhibitors, benzothiadiazides and other diuretics, initial encounter: Secondary | ICD-10-CM | POA: Diagnosis not present

## 2021-01-20 DIAGNOSIS — R109 Unspecified abdominal pain: Secondary | ICD-10-CM

## 2021-01-20 DIAGNOSIS — D638 Anemia in other chronic diseases classified elsewhere: Secondary | ICD-10-CM | POA: Diagnosis present

## 2021-01-20 DIAGNOSIS — Z7401 Bed confinement status: Secondary | ICD-10-CM

## 2021-01-20 DIAGNOSIS — Z20822 Contact with and (suspected) exposure to covid-19: Secondary | ICD-10-CM | POA: Diagnosis present

## 2021-01-20 DIAGNOSIS — M1990 Unspecified osteoarthritis, unspecified site: Secondary | ICD-10-CM | POA: Diagnosis present

## 2021-01-20 DIAGNOSIS — Z885 Allergy status to narcotic agent status: Secondary | ICD-10-CM

## 2021-01-20 DIAGNOSIS — E876 Hypokalemia: Secondary | ICD-10-CM | POA: Diagnosis not present

## 2021-01-20 DIAGNOSIS — M7981 Nontraumatic hematoma of soft tissue: Secondary | ICD-10-CM | POA: Diagnosis present

## 2021-01-20 DIAGNOSIS — T8619 Other complication of kidney transplant: Secondary | ICD-10-CM | POA: Diagnosis present

## 2021-01-20 DIAGNOSIS — I959 Hypotension, unspecified: Secondary | ICD-10-CM | POA: Diagnosis present

## 2021-01-20 DIAGNOSIS — E1122 Type 2 diabetes mellitus with diabetic chronic kidney disease: Secondary | ICD-10-CM | POA: Diagnosis present

## 2021-01-20 DIAGNOSIS — Z794 Long term (current) use of insulin: Secondary | ICD-10-CM

## 2021-01-20 DIAGNOSIS — E861 Hypovolemia: Secondary | ICD-10-CM | POA: Diagnosis not present

## 2021-01-20 DIAGNOSIS — G9341 Metabolic encephalopathy: Secondary | ICD-10-CM

## 2021-01-20 DIAGNOSIS — Z96642 Presence of left artificial hip joint: Secondary | ICD-10-CM | POA: Diagnosis present

## 2021-01-20 DIAGNOSIS — E871 Hypo-osmolality and hyponatremia: Secondary | ICD-10-CM | POA: Diagnosis present

## 2021-01-20 DIAGNOSIS — F32A Depression, unspecified: Secondary | ICD-10-CM | POA: Diagnosis present

## 2021-01-20 DIAGNOSIS — E669 Obesity, unspecified: Secondary | ICD-10-CM | POA: Diagnosis present

## 2021-01-20 DIAGNOSIS — A0472 Enterocolitis due to Clostridium difficile, not specified as recurrent: Secondary | ICD-10-CM | POA: Diagnosis present

## 2021-01-20 DIAGNOSIS — J9601 Acute respiratory failure with hypoxia: Secondary | ICD-10-CM | POA: Diagnosis present

## 2021-01-20 DIAGNOSIS — L89152 Pressure ulcer of sacral region, stage 2: Secondary | ICD-10-CM | POA: Diagnosis present

## 2021-01-20 DIAGNOSIS — E785 Hyperlipidemia, unspecified: Secondary | ICD-10-CM | POA: Diagnosis present

## 2021-01-20 DIAGNOSIS — Z87891 Personal history of nicotine dependence: Secondary | ICD-10-CM

## 2021-01-20 DIAGNOSIS — I7300 Raynaud's syndrome without gangrene: Secondary | ICD-10-CM | POA: Diagnosis present

## 2021-01-20 DIAGNOSIS — M109 Gout, unspecified: Secondary | ICD-10-CM | POA: Diagnosis present

## 2021-01-20 DIAGNOSIS — Z515 Encounter for palliative care: Secondary | ICD-10-CM

## 2021-01-20 DIAGNOSIS — E1140 Type 2 diabetes mellitus with diabetic neuropathy, unspecified: Secondary | ICD-10-CM | POA: Diagnosis present

## 2021-01-20 DIAGNOSIS — I130 Hypertensive heart and chronic kidney disease with heart failure and stage 1 through stage 4 chronic kidney disease, or unspecified chronic kidney disease: Secondary | ICD-10-CM | POA: Diagnosis present

## 2021-01-20 DIAGNOSIS — E213 Hyperparathyroidism, unspecified: Secondary | ICD-10-CM | POA: Diagnosis present

## 2021-01-20 DIAGNOSIS — G4733 Obstructive sleep apnea (adult) (pediatric): Secondary | ICD-10-CM | POA: Diagnosis present

## 2021-01-20 DIAGNOSIS — Z91041 Radiographic dye allergy status: Secondary | ICD-10-CM

## 2021-01-20 DIAGNOSIS — Y830 Surgical operation with transplant of whole organ as the cause of abnormal reaction of the patient, or of later complication, without mention of misadventure at the time of the procedure: Secondary | ICD-10-CM | POA: Diagnosis present

## 2021-01-20 DIAGNOSIS — Z8249 Family history of ischemic heart disease and other diseases of the circulatory system: Secondary | ICD-10-CM

## 2021-01-20 DIAGNOSIS — J449 Chronic obstructive pulmonary disease, unspecified: Secondary | ICD-10-CM | POA: Diagnosis present

## 2021-01-20 DIAGNOSIS — E43 Unspecified severe protein-calorie malnutrition: Secondary | ICD-10-CM | POA: Diagnosis present

## 2021-01-20 DIAGNOSIS — Z66 Do not resuscitate: Secondary | ICD-10-CM | POA: Diagnosis not present

## 2021-01-20 DIAGNOSIS — Z9981 Dependence on supplemental oxygen: Secondary | ICD-10-CM | POA: Diagnosis not present

## 2021-01-20 DIAGNOSIS — Z9225 Personal history of immunosupression therapy: Secondary | ICD-10-CM

## 2021-01-20 DIAGNOSIS — R627 Adult failure to thrive: Secondary | ICD-10-CM | POA: Diagnosis present

## 2021-01-20 DIAGNOSIS — R54 Age-related physical debility: Secondary | ICD-10-CM | POA: Diagnosis present

## 2021-01-20 DIAGNOSIS — Z8271 Family history of polycystic kidney: Secondary | ICD-10-CM

## 2021-01-20 DIAGNOSIS — R0902 Hypoxemia: Secondary | ICD-10-CM

## 2021-01-20 DIAGNOSIS — Z881 Allergy status to other antibiotic agents status: Secondary | ICD-10-CM

## 2021-01-20 DIAGNOSIS — N1832 Chronic kidney disease, stage 3b: Secondary | ICD-10-CM | POA: Diagnosis not present

## 2021-01-20 DIAGNOSIS — Z79899 Other long term (current) drug therapy: Secondary | ICD-10-CM

## 2021-01-20 DIAGNOSIS — A498 Other bacterial infections of unspecified site: Secondary | ICD-10-CM | POA: Diagnosis not present

## 2021-01-20 DIAGNOSIS — K5720 Diverticulitis of large intestine with perforation and abscess without bleeding: Secondary | ICD-10-CM | POA: Diagnosis not present

## 2021-01-20 DIAGNOSIS — R188 Other ascites: Secondary | ICD-10-CM

## 2021-01-20 DIAGNOSIS — I9589 Other hypotension: Secondary | ICD-10-CM | POA: Diagnosis not present

## 2021-01-20 DIAGNOSIS — J9621 Acute and chronic respiratory failure with hypoxia: Secondary | ICD-10-CM | POA: Diagnosis present

## 2021-01-20 DIAGNOSIS — R4182 Altered mental status, unspecified: Secondary | ICD-10-CM | POA: Diagnosis present

## 2021-01-20 DIAGNOSIS — Z7901 Long term (current) use of anticoagulants: Secondary | ICD-10-CM

## 2021-01-20 DIAGNOSIS — I480 Paroxysmal atrial fibrillation: Secondary | ICD-10-CM | POA: Diagnosis present

## 2021-01-20 LAB — CBC WITH DIFFERENTIAL/PLATELET
Abs Immature Granulocytes: 0.75 10*3/uL — ABNORMAL HIGH (ref 0.00–0.07)
Basophils Absolute: 0 10*3/uL (ref 0.0–0.1)
Basophils Relative: 0 %
Eosinophils Absolute: 0 10*3/uL (ref 0.0–0.5)
Eosinophils Relative: 0 %
HCT: 27.1 % — ABNORMAL LOW (ref 36.0–46.0)
Hemoglobin: 8.5 g/dL — ABNORMAL LOW (ref 12.0–15.0)
Immature Granulocytes: 11 %
Lymphocytes Relative: 15 %
Lymphs Abs: 1.1 10*3/uL (ref 0.7–4.0)
MCH: 30.9 pg (ref 26.0–34.0)
MCHC: 31.4 g/dL (ref 30.0–36.0)
MCV: 98.5 fL (ref 80.0–100.0)
Monocytes Absolute: 0.3 10*3/uL (ref 0.1–1.0)
Monocytes Relative: 5 %
Neutro Abs: 4.6 10*3/uL (ref 1.7–7.7)
Neutrophils Relative %: 69 %
Platelets: 199 10*3/uL (ref 150–400)
RBC: 2.75 MIL/uL — ABNORMAL LOW (ref 3.87–5.11)
RDW: 20 % — ABNORMAL HIGH (ref 11.5–15.5)
WBC: 6.8 10*3/uL (ref 4.0–10.5)
nRBC: 0 % (ref 0.0–0.2)

## 2021-01-20 LAB — PROTIME-INR
INR: 2.8 — ABNORMAL HIGH (ref 0.8–1.2)
Prothrombin Time: 28.9 seconds — ABNORMAL HIGH (ref 11.4–15.2)

## 2021-01-20 LAB — LACTIC ACID, PLASMA
Lactic Acid, Venous: 0.8 mmol/L (ref 0.5–1.9)
Lactic Acid, Venous: 0.9 mmol/L (ref 0.5–1.9)

## 2021-01-20 LAB — BLOOD GAS, ARTERIAL
Acid-base deficit: 5 mmol/L — ABNORMAL HIGH (ref 0.0–2.0)
Bicarbonate: 19.6 mmol/L — ABNORMAL LOW (ref 20.0–28.0)
O2 Saturation: 100 %
Patient temperature: 98.6
pCO2 arterial: 37.1 mmHg (ref 32.0–48.0)
pH, Arterial: 7.343 — ABNORMAL LOW (ref 7.350–7.450)
pO2, Arterial: 351 mmHg — ABNORMAL HIGH (ref 83.0–108.0)

## 2021-01-20 LAB — URINALYSIS, ROUTINE W REFLEX MICROSCOPIC
Bilirubin Urine: NEGATIVE
Glucose, UA: NEGATIVE mg/dL
Hgb urine dipstick: NEGATIVE
Ketones, ur: NEGATIVE mg/dL
Leukocytes,Ua: NEGATIVE
Nitrite: NEGATIVE
Protein, ur: NEGATIVE mg/dL
Specific Gravity, Urine: 1.015 (ref 1.005–1.030)
pH: 5 (ref 5.0–8.0)

## 2021-01-20 LAB — LIPASE, BLOOD: Lipase: 16 U/L (ref 11–51)

## 2021-01-20 LAB — COMPREHENSIVE METABOLIC PANEL
ALT: 8 U/L (ref 0–44)
AST: 25 U/L (ref 15–41)
Albumin: 1.9 g/dL — ABNORMAL LOW (ref 3.5–5.0)
Alkaline Phosphatase: 94 U/L (ref 38–126)
Anion gap: 10 (ref 5–15)
BUN: 55 mg/dL — ABNORMAL HIGH (ref 8–23)
CO2: 20 mmol/L — ABNORMAL LOW (ref 22–32)
Calcium: 8.4 mg/dL — ABNORMAL LOW (ref 8.9–10.3)
Chloride: 96 mmol/L — ABNORMAL LOW (ref 98–111)
Creatinine, Ser: 2.19 mg/dL — ABNORMAL HIGH (ref 0.44–1.00)
GFR, Estimated: 25 mL/min — ABNORMAL LOW (ref >60)
Glucose, Bld: 75 mg/dL (ref 70–99)
Potassium: 4.7 mmol/L (ref 3.5–5.1)
Sodium: 126 mmol/L — ABNORMAL LOW (ref 135–145)
Total Bilirubin: 1.2 mg/dL (ref 0.3–1.2)
Total Protein: 6 g/dL — ABNORMAL LOW (ref 6.5–8.1)

## 2021-01-20 LAB — MRSA PCR SCREENING: MRSA by PCR: NEGATIVE

## 2021-01-20 LAB — BRAIN NATRIURETIC PEPTIDE: B Natriuretic Peptide: 188.6 pg/mL — ABNORMAL HIGH (ref 0.0–100.0)

## 2021-01-20 LAB — GLUCOSE, CAPILLARY: Glucose-Capillary: 110 mg/dL — ABNORMAL HIGH (ref 70–99)

## 2021-01-20 LAB — RESP PANEL BY RT-PCR (FLU A&B, COVID) ARPGX2
Influenza A by PCR: NEGATIVE
Influenza B by PCR: NEGATIVE
SARS Coronavirus 2 by RT PCR: NEGATIVE

## 2021-01-20 MED ORDER — VANCOMYCIN HCL IN DEXTROSE 1-5 GM/200ML-% IV SOLN
1000.0000 mg | Freq: Once | INTRAVENOUS | Status: DC
  Administered 2021-01-20: 1000 mg via INTRAVENOUS
  Filled 2021-01-20: qty 200

## 2021-01-20 MED ORDER — ONDANSETRON HCL 4 MG PO TABS: 4.0000 mg | ORAL_TABLET | Freq: Four times a day (QID) | ORAL | Status: DC | PRN

## 2021-01-20 MED ORDER — SODIUM CHLORIDE 0.9% FLUSH
3.0000 mL | Freq: Two times a day (BID) | INTRAVENOUS | Status: DC
  Administered 2021-01-20 – 2021-02-07 (×27): 3 mL via INTRAVENOUS

## 2021-01-20 MED ORDER — ALLOPURINOL 100 MG PO TABS
100.0000 mg | ORAL_TABLET | Freq: Every day | ORAL | Status: DC
  Administered 2021-01-21 – 2021-02-04 (×15): 100 mg via ORAL
  Filled 2021-01-20 (×15): qty 1

## 2021-01-20 MED ORDER — ACETAMINOPHEN 650 MG RE SUPP: 650.0000 mg | Freq: Four times a day (QID) | RECTAL | Status: DC | PRN

## 2021-01-20 MED ORDER — INSULIN ASPART 100 UNIT/ML ~~LOC~~ SOLN
0.0000 [IU] | Freq: Every day | SUBCUTANEOUS | Status: DC
  Administered 2021-01-25: 3 [IU] via SUBCUTANEOUS
  Administered 2021-01-29: 2 [IU] via SUBCUTANEOUS

## 2021-01-20 MED ORDER — SODIUM CHLORIDE 0.9 % IV BOLUS
500.0000 mL | Freq: Once | INTRAVENOUS | Status: AC
  Administered 2021-01-20: 500 mL via INTRAVENOUS

## 2021-01-20 MED ORDER — HYDROCORTISONE NA SUCCINATE PF 100 MG IJ SOLR
50.0000 mg | Freq: Three times a day (TID) | INTRAMUSCULAR | Status: DC
  Administered 2021-01-20 – 2021-01-26 (×18): 50 mg via INTRAVENOUS
  Filled 2021-01-20 (×18): qty 2

## 2021-01-20 MED ORDER — METRONIDAZOLE IN NACL 5-0.79 MG/ML-% IV SOLN
500.0000 mg | Freq: Once | INTRAVENOUS | Status: AC
  Administered 2021-01-20: 500 mg via INTRAVENOUS
  Filled 2021-01-20: qty 100

## 2021-01-20 MED ORDER — ALBUMIN HUMAN 5 % IV SOLN
12.5000 g | Freq: Once | INTRAVENOUS | Status: AC
  Administered 2021-01-20: 12.5 g via INTRAVENOUS
  Filled 2021-01-20: qty 250

## 2021-01-20 MED ORDER — ACETAMINOPHEN 325 MG PO TABS
650.0000 mg | ORAL_TABLET | Freq: Four times a day (QID) | ORAL | Status: DC | PRN
  Administered 2021-01-22 – 2021-01-24 (×3): 650 mg via ORAL
  Filled 2021-01-20 (×3): qty 2

## 2021-01-20 MED ORDER — SODIUM CHLORIDE 0.9 % IV SOLN
2.0000 g | INTRAVENOUS | Status: DC
  Administered 2021-01-21 – 2021-02-03 (×14): 2 g via INTRAVENOUS
  Filled 2021-01-20 (×15): qty 2

## 2021-01-20 MED ORDER — CHLORHEXIDINE GLUCONATE CLOTH 2 % EX PADS
6.0000 | MEDICATED_PAD | Freq: Every day | CUTANEOUS | Status: DC
  Administered 2021-01-20 – 2021-01-31 (×12): 6 via TOPICAL

## 2021-01-20 MED ORDER — ORAL CARE MOUTH RINSE
15.0000 mL | Freq: Two times a day (BID) | OROMUCOSAL | Status: DC
  Administered 2021-01-20 – 2021-01-27 (×13): 15 mL via OROMUCOSAL

## 2021-01-20 MED ORDER — VANCOMYCIN HCL IN DEXTROSE 1-5 GM/200ML-% IV SOLN
1000.0000 mg | INTRAVENOUS | Status: DC
  Administered 2021-01-20: 1000 mg via INTRAVENOUS
  Filled 2021-01-20: qty 200

## 2021-01-20 MED ORDER — ONDANSETRON HCL 4 MG/2ML IJ SOLN
4.0000 mg | Freq: Four times a day (QID) | INTRAMUSCULAR | Status: DC | PRN
  Administered 2021-01-24 – 2021-02-03 (×11): 4 mg via INTRAVENOUS
  Filled 2021-01-20 (×11): qty 2

## 2021-01-20 MED ORDER — DORZOLAMIDE HCL 2 % OP SOLN
1.0000 [drp] | Freq: Every day | OPHTHALMIC | Status: DC
  Administered 2021-01-21 – 2021-02-04 (×15): 1 [drp] via OPHTHALMIC
  Filled 2021-01-20: qty 10

## 2021-01-20 MED ORDER — FLUOXETINE HCL 20 MG PO CAPS
40.0000 mg | ORAL_CAPSULE | Freq: Every day | ORAL | Status: DC
  Administered 2021-01-21 – 2021-02-04 (×15): 40 mg via ORAL
  Filled 2021-01-20 (×15): qty 2

## 2021-01-20 MED ORDER — SODIUM CHLORIDE 0.9 % IV BOLUS: 500.0000 mL | Freq: Once | INTRAVENOUS | Status: DC

## 2021-01-20 MED ORDER — HEPARIN (PORCINE) 25000 UT/250ML-% IV SOLN
750.0000 [IU]/h | INTRAVENOUS | Status: DC
  Administered 2021-01-20: 750 [IU]/h via INTRAVENOUS
  Filled 2021-01-20: qty 250

## 2021-01-20 MED ORDER — METRONIDAZOLE IN NACL 5-0.79 MG/ML-% IV SOLN
500.0000 mg | Freq: Three times a day (TID) | INTRAVENOUS | Status: DC
  Administered 2021-01-21 – 2021-02-04 (×44): 500 mg via INTRAVENOUS
  Filled 2021-01-20 (×42): qty 100

## 2021-01-20 MED ORDER — INSULIN GLARGINE 100 UNIT/ML ~~LOC~~ SOLN
5.0000 [IU] | Freq: Two times a day (BID) | SUBCUTANEOUS | Status: DC
  Administered 2021-01-20 – 2021-01-29 (×18): 5 [IU] via SUBCUTANEOUS
  Filled 2021-01-20 (×18): qty 0.05

## 2021-01-20 MED ORDER — TACROLIMUS 1 MG PO CAPS
1.0000 mg | ORAL_CAPSULE | Freq: Two times a day (BID) | ORAL | Status: DC
  Administered 2021-01-21 – 2021-02-04 (×29): 1 mg via ORAL
  Filled 2021-01-20 (×33): qty 1

## 2021-01-20 MED ORDER — BRIMONIDINE TARTRATE 0.15 % OP SOLN
1.0000 [drp] | Freq: Two times a day (BID) | OPHTHALMIC | Status: DC
  Administered 2021-01-20 – 2021-02-04 (×31): 1 [drp] via OPHTHALMIC
  Filled 2021-01-20: qty 5

## 2021-01-20 MED ORDER — ORAL CARE MOUTH RINSE: 15.0000 mL | Freq: Two times a day (BID) | OROMUCOSAL | Status: DC

## 2021-01-20 MED ORDER — INSULIN ASPART 100 UNIT/ML ~~LOC~~ SOLN
0.0000 [IU] | Freq: Three times a day (TID) | SUBCUTANEOUS | Status: DC
  Administered 2021-01-21: 1 [IU] via SUBCUTANEOUS
  Administered 2021-01-22 – 2021-01-23 (×5): 2 [IU] via SUBCUTANEOUS
  Administered 2021-01-23: 1 [IU] via SUBCUTANEOUS
  Administered 2021-01-24: 2 [IU] via SUBCUTANEOUS
  Administered 2021-01-24: 3 [IU] via SUBCUTANEOUS
  Administered 2021-01-24 – 2021-01-25 (×2): 2 [IU] via SUBCUTANEOUS
  Administered 2021-01-25 (×2): 3 [IU] via SUBCUTANEOUS
  Administered 2021-01-26 (×2): 2 [IU] via SUBCUTANEOUS
  Administered 2021-01-26: 3 [IU] via SUBCUTANEOUS
  Administered 2021-01-27 – 2021-01-28 (×3): 1 [IU] via SUBCUTANEOUS
  Administered 2021-01-29: 3 [IU] via SUBCUTANEOUS
  Administered 2021-01-29 – 2021-01-30 (×2): 1 [IU] via SUBCUTANEOUS
  Administered 2021-01-30: 2 [IU] via SUBCUTANEOUS
  Administered 2021-01-31: 1 [IU] via SUBCUTANEOUS
  Administered 2021-02-02: 2 [IU] via SUBCUTANEOUS
  Administered 2021-02-03 (×2): 1 [IU] via SUBCUTANEOUS
  Administered 2021-02-04 (×2): 2 [IU] via SUBCUTANEOUS

## 2021-01-20 MED ORDER — SODIUM CHLORIDE 0.9 % IV SOLN
2.0000 g | Freq: Once | INTRAVENOUS | Status: AC
  Administered 2021-01-20: 2 g via INTRAVENOUS
  Filled 2021-01-20: qty 2

## 2021-01-20 MED ORDER — CYCLOSPORINE 0.05 % OP EMUL
1.0000 [drp] | Freq: Two times a day (BID) | OPHTHALMIC | Status: DC
  Administered 2021-01-20 – 2021-02-04 (×31): 1 [drp] via OPHTHALMIC
  Filled 2021-01-20 (×39): qty 1

## 2021-01-20 MED ORDER — LATANOPROST 0.005 % OP SOLN
1.0000 [drp] | Freq: Every day | OPHTHALMIC | Status: DC
  Administered 2021-01-20 – 2021-02-04 (×16): 1 [drp] via OPHTHALMIC
  Filled 2021-01-20: qty 2.5

## 2021-01-20 MED ORDER — MYCOPHENOLATE SODIUM 180 MG PO TBEC
180.0000 mg | DELAYED_RELEASE_TABLET | Freq: Two times a day (BID) | ORAL | Status: DC
  Administered 2021-01-21 – 2021-02-04 (×29): 180 mg via ORAL
  Filled 2021-01-20 (×33): qty 1

## 2021-01-20 MED ORDER — SODIUM CHLORIDE 0.9 % IV SOLN: INTRAVENOUS | Status: DC

## 2021-01-20 NOTE — ED Triage Notes (Signed)
"  April [name]" presents from [facility]. Per the patient's visitor the patient was last seen well was yesterday at 3pm. EMS report the patient being non responsive with purple palms. She did become more alert when placed on non re breather at 15L. They also report the abdominal wound appearing green with foul smelling drainage. She was recently discharged from the ICU with a colostomy.   Patient is also experiencing gout in the elbows, diverticulitis and bed sores.      EMS vitals: SR  60's HR 101 CBG 98.6% non rebreather (chronic 4L) 82/42BP

## 2021-01-20 NOTE — ED Provider Notes (Signed)
[facility] DEPT Provider Note   CSN: [id number] Arrival date & time: 01/06/2021  1243     History Chief Complaint  Patient presents with  . Hypotension    April [name] is a 65 y.o. female.  The history is provided by the patient and medical records. No language interpreter was used.  Altered Mental Status Presenting symptoms: confusion, partial responsiveness and unresponsiveness   Severity:  Severe Most recent episode:  Today Episode history:  Single Timing:  Constant Progression:  Improving Chronicity:  New Context: recent illness and recent infection   Context: not dementia   Associated symptoms: abdominal pain and light-headedness   Associated symptoms: no agitation, no fever, no headaches, no nausea, no palpitations and no vomiting        Past Medical History:  Diagnosis Date  . Arthritis   . Asthma   . CHF (congestive heart failure) ([place])   . COPD (chronic obstructive pulmonary disease) ([place])   . Depression   . Essential hypertension 02/08/2020  . GERD (gastroesophageal reflux disease)   . Hyperlipidemia   . Hyperparathyroidism   . Polycystic kidney   . PONV (postoperative nausea and vomiting)     Patient Active Problem List   Diagnosis Date Noted  . Difficult intravenous access   . Bilious vomiting with nausea   . Sigmoid diverticulitis 12/16/2020  . Acute CHF (congestive heart failure) ([place]) 12/16/2020  . Fever of unknown origin 10/24/2020  . Fever of unknown origin (FUO) 10/23/2020  . Lower GI bleeding 09/25/2020  . Diarrhea 09/10/2020  . Acute renal failure superimposed on stage 3b chronic kidney disease ([place]) 09/08/2020  . Acute diverticulitis 09/04/2020  . SIRS (systemic inflammatory response syndrome) ([place]) 07/02/2020  . Chronic kidney disease, stage 3a ([place])   . 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 History of immunosuppressive therapy 05/27/2020  . FUO (fever of unknown origin) 05/15/2020  .  Immunocompromised state ([place])   . Decubitus ulcer of coccyx, unstageable ([place]) 05/01/2020  . Pericardial effusion 04/16/2020  . Chronic respiratory failure with hypoxia ([place]) 04/16/2020  . Proteinuria 04/16/2020  . Pressure injury of skin of buttock 04/08/2020  . COPD (chronic obstructive pulmonary disease) ([place]) 04/07/2020  . SVT (supraventricular tachycardia) ([place])   . Anemia of chronic disease   . Palliative care encounter   . Goals of care, counseling/discussion   . Essential hypertension 02/08/2020  . Glaucoma 02/08/2020  . Sepsis ([place]) 01/31/2020  . SOB (shortness of breath)   . Fracture of left superior pubic ramus (H[facility])   . Pain   . Anxiety state   . Left displaced femoral neck fracture ([place]) 12/15/2019  . Status post total hip replacement, left   . Post-op pain   . Polycystic kidney   . Closed left hip fracture ([place]) 12/07/2019  . Left wrist fracture 12/07/2019  . Medication management 09/22/2019  . Physical deconditioning 09/22/2019  . Acute respiratory failure with hypoxia ([place]) 04/19/2019  . Acute on chronic diastolic CHF (congestive heart failure) ([place]) 04/19/2019  . Bilateral closed proximal tibial fracture 12/21/2018  . Asthma, chronic, unspecified asthma severity, with acute exacerbation 10/03/2018  . Chronic diastolic CHF (congestive heart failure) ([place]) 10/01/2017  . Asthma, mild intermittent 07/15/2016  . GERD without esophagitis 07/15/2016  . Renal transplant recipient 07/15/2016  . Hyperlipidemia 07/15/2016  . Depression 07/15/2016  . Bronchitis, mucopurulent recurrent ([place]) 08/08/2014  . Chronic cough 07/24/2014  . End stage renal disease ([place]) 03/23/2012  . Other complications due  to renal dialysis device, implant, and graft 03/23/2012    Past Surgical History:  Procedure Laterality Date  . 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 BILATERAL OOPHORECTOMY    . BIOPSY  05/19/2020   Procedure: BIOPSY;  Surgeon: Carol Ada, MD;   Location: [facility];  Service: Endoscopy;;  . CARDIOVERSION N/A 12/25/2020   Procedure: CARDIOVERSION;  Surgeon: Geralynn Rile, MD;  Location: [facility];  Service: Cardiovascular;  Laterality: N/A;  . CARPAL TUNNEL RELEASE  2000  . CESAREAN SECTION    . COLONOSCOPY WITH PROPOFOL N/A 05/19/2020   Procedure: COLONOSCOPY WITH PROPOFOL;  Surgeon: Carol Ada, MD;  Location: [facility];  Service: Endoscopy;  Laterality: N/A;  . ENTEROSCOPY N/A 05/19/2020   Procedure: ENTEROSCOPY;  Surgeon: Carol Ada, MD;  Location: [facility];  Service: Endoscopy;  Laterality: N/A;  . INSERTION OF DIALYSIS CATHETER  03/31/2012   Procedure: INSERTION OF DIALYSIS CATHETER;  Surgeon: Angelia Mould, MD;  Location: [facility];  Service: Vascular;  Laterality: N/A;  insertion of dialysis catheter right internal jugular vein  . 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 LAPAROTOMY N/A 12/29/2020   Procedure: EXPLORATORY LAPAROTOMY, SIGMOID COLECTOMY AND COLOSTOMY;  Surgeon: Coralie Keens, MD;  Location: [facility];  Service: General;  Laterality: N/A;  Packed Dressing  . ORIF TIBIA FRACTURE Left 12/23/2018   Procedure: OPEN REDUCTION INTERNAL FIXATION (ORIF) TIBIA FRACTURE;  Surgeon: Shona Needles, MD;  Location: [facility];  Service: Orthopedics;  Laterality: Left;  . ORIF TIBIA PLATEAU Right 12/23/2018   Procedure: OPEN REDUCTION INTERNAL FIXATION (ORIF) TIBIAL PLATEAU;  Surgeon: Shona Needles, MD;  Location: [facility];  Service: Orthopedics;  Laterality: Right;  . ORIF WRIST FRACTURE Left 12/08/2019   Procedure: OPEN REDUCTION INTERNAL FIXATION (ORIF) WRIST FRACTURE, REPAIR LACERATION LEFT FOREARM;  Surgeon: Dorna Leitz, MD;  Location: [facility];  Service: Orthopedics;  Laterality: Left;  . TEE WITHOUT CARDIOVERSION N/A 12/25/2020   Procedure: TRANSESOPHAGEAL ECHOCARDIOGRAM (TEE);  Surgeon: Geralynn Rile, MD;  Location: [facility];  Service: Cardiovascular;  Laterality: N/A;  . [place]  . TOTAL  HIP ARTHROPLASTY Left 12/08/2019   Procedure: TOTAL HIP ARTHROPLASTY ANTERIOR APPROACH;  Surgeon: Dorna Leitz, MD;  Location: [facility];  Service: Orthopedics;  Laterality: Left;  . TUBAL LIGATION  2010     OB History   No obstetric history on file.     Family History  Problem Relation Age of Onset  . Hypertension Mother   . Heart disease Father        CABG history  . Polycystic kidney disease Father   . Polycystic kidney disease Brother     Social History   Tobacco Use  . Smoking status: Former Smoker    Packs/day: 0.25    Years: 15.00    Pack years: 3.75    Types: Cigarettes    Quit date: 03/22/1998    Years since quitting: 22.8  . Smokeless tobacco: Never Used  Vaping Use  . Vaping Use: Never used  Substance Use Topics  . Alcohol use: No  . Drug use: No    Home Medications Prior to Admission medications   Medication Sig Start Date End Date Taking? Authorizing Provider  acetaminophen (TYLENOL) 325 MG tablet Take 2 tablets (650 mg total) by mouth every 6 (six) hours as needed for mild pain (or Fever >/= 101). 05/22/20   Elgergawy, Silver Huguenin, MD  albuterol (VENTOLIN HFA) 108 (90 Base) MCG/ACT  inhaler Inhale 2 puffs into the lungs every 4 (four) hours as needed for wheezing or shortness of breath. 01/04/20   Angiulli, Lavon Paganini, PA-C  allopurinol (ZYLOPRIM) 100 MG tablet Take 100 mg by mouth daily. 11/29/20   [provider]  amLODipine (NORVASC) 5 MG tablet Take 1 tablet (5 mg total) by mouth daily. 01/15/21   Mercy Riding, MD  apixaban (ELIQUIS) 5 MG TABS tablet Take 1 tablet (5 mg total) by mouth 2 (two) times daily. 01/14/21   Mercy Riding, MD  brimonidine (ALPHAGAN P) 0.1 % SOLN Place 1 drop into both eyes 2 (two) times daily.     [provider]  Budeson-Glycopyrrol-Formoterol (BREZTRI AEROSPHERE) 160-9-4.8 MCG/ACT AERO Inhale 2 puffs into the lungs 2 (two) times daily. 08/20/20   Martyn Ehrich, NP  cholecalciferol (VITAMIN D3) 25 MCG (1000 UNIT) tablet  Take 1 tablet (1,000 Units total) by mouth daily. 01/04/20   Angiulli, Lavon Paganini, PA-C  colchicine 0.6 MG tablet Take 0.5 tablets (0.3 mg total) by mouth daily. 01/15/21   Mercy Riding, MD  cyanocobalamin 1000 MCG tablet Take 1,000 mcg by mouth daily.    [provider]  cycloSPORINE (RESTASIS) 0.05 % ophthalmic emulsion Place 1 drop into both eyes 2 (two) times daily. 01/04/20   Angiulli, Lavon Paganini, PA-C  dorzolamide (TRUSOPT) 2 % ophthalmic solution Place 1 drop into both eyes daily. 06/25/19   [provider]  feeding supplement (ENSURE ENLIVE / ENSURE PLUS) LIQD Take 237 mLs by mouth 2 (two) times daily between meals. 01/14/21   Mercy Riding, MD  ferrous sulfate 325 (65 FE) MG tablet Take 325 mg by mouth daily with breakfast.    [provider]  FLUoxetine (PROZAC) 40 MG capsule Take 1 capsule (40 mg total) by mouth daily. 01/04/20   Angiulli, Lavon Paganini, PA-C  furosemide (LASIX) 40 MG tablet Take 1 tablet (40 mg total) by mouth 2 (two) times daily. 01/14/21   Mercy Riding, MD  gabapentin (NEURONTIN) 100 MG capsule Take 200 mg by mouth 2 (two) times daily.    [provider]  guaiFENesin (MUCINEX) 600 MG 12 hr tablet Take 600 mg by mouth 2 (two) times daily.    [provider]  insulin aspart (NOVOLOG) 100 UNIT/ML injection Inject 0-9 Units into the skin 3 (three) times daily with meals. CBG 70 - 120: 0 units CBG 121 - 150: 1 unit CBG 151 - 200: 2 units CBG 201 - 250: 3 units CBG 251 - 300: 5 units CBG 301 - 350: 7 units CBG 351 - 400: 9 units 01/14/21   Gonfa, Taye T, MD  insulin glargine (LANTUS) 100 UNIT/ML injection Inject 0.05 mLs (5 Units total) into the skin 2 (two) times daily. 01/14/21   Mercy Riding, MD  ipratropium-albuterol (DUONEB) 0.5-2.5 (3) MG/3ML SOLN Take 3 mLs by nebulization every 6 (six) hours as needed (sob/wheezing).  08/24/20   [provider]  metoprolol tartrate (LOPRESSOR) 25 MG tablet Take 1 tablet (25 mg total) by mouth 2  (two) times daily. 01/14/21   Mercy Riding, MD  mycophenolate (MYFORTIC) 180 MG EC tablet Take 180 mg by mouth 2 (two) times daily.    [provider]  Nutritional Supplements (,FEEDING SUPPLEMENT, PROSOURCE PLUS) liquid Take 30 mLs by mouth 2 (two) times daily between meals. 01/14/21   Mercy Riding, MD  ondansetron (ZOFRAN) 4 MG tablet Take 1 tablet (4 mg total) by mouth every 8 (  eight) hours as needed for nausea or vomiting. 01/14/21 01/14/22  Mercy Riding, MD  oxyCODONE (OXY IR/ROXICODONE) 5 MG immediate release tablet Take 1 tablet (5 mg total) by mouth every 6 (six) hours as needed for moderate pain or severe pain (Unresponsive to acetaminophen). 01/14/21   Mercy Riding, MD  pantoprazole (PROTONIX) 40 MG tablet Take 1 tablet (40 mg total) by mouth daily. 01/15/21   Mercy Riding, MD  predniSONE (DELTASONE) 2.5 MG tablet Take 3 tablets (7.5 mg total) by mouth daily with breakfast. 01/15/21   Mercy Riding, MD  PROGRAF 1 MG capsule Take 1 mg by mouth 2 (two) times daily.  03/22/19   [provider]  promethazine (PHENERGAN) 25 MG tablet Take 1 tablet (25 mg total) by mouth every 8 (eight) hours as needed for up to 15 doses for nausea or vomiting. 09/02/20   Curatolo, Adam, DO  sodium hypochlorite (DAKIN'S 1/4 STRENGTH) 0.125 % SOLN Irrigate with as directed 2 (two) times daily. 01/14/21   Mercy Riding, MD  TRAVATAN Z 0.004 % SOLN ophthalmic solution Place 1 drop into both eyes at bedtime. 07/03/16   [provider]  valACYclovir (VALTREX) 500 MG tablet Take 500 mg by mouth 2 (two) times daily.    [provider]    Allergies    Ardyth Harps [iron dextran], Pentamidine, Budesonide-formoterol fumarate, Bupropion, Crestor [rosuvastatin], Duloxetine hcl, Erythromycin [erythromycin], Iohexol, Oxycodone, Pravastatin sodium, Erythromycin, and Ultram [tramadol hcl]  Review of Systems   Review of Systems  Unable to perform ROS: Mental status change  Constitutional: Positive for  chills and fatigue. Negative for diaphoresis and fever.  HENT: Negative for congestion.   Eyes: Negative for visual disturbance.  Respiratory: Positive for cough and shortness of breath. Negative for chest tightness and wheezing.   Cardiovascular: Positive for leg swelling. Negative for chest pain and palpitations.  Gastrointestinal: Positive for abdominal pain. Negative for constipation, diarrhea, nausea and vomiting.  Genitourinary: Negative for dysuria and flank pain.  Musculoskeletal: Negative for back pain, neck pain and neck stiffness.  Neurological: Positive for light-headedness. Negative for dizziness and headaches.  Psychiatric/Behavioral: Positive for confusion. Negative for agitation.    Physical Exam Updated Vital Signs BP (!) 84/59 (BP Location: Right Arm)   Pulse 82   Temp 98.6 F (37 C) (Oral)   Resp (!) 24   SpO2 (!) 10%   Physical Exam Vitals and nursing note reviewed.  Constitutional:      General: She is not in acute distress.    Appearance: She is well-developed and well-nourished. She is ill-appearing. She is not toxic-appearing or diaphoretic.  HENT:     Head: Normocephalic and atraumatic.     Nose: No congestion or rhinorrhea.     Mouth/Throat:     Mouth: Mucous membranes are moist.     Pharynx: No oropharyngeal exudate or posterior oropharyngeal erythema.  Eyes:     Extraocular Movements: Extraocular movements intact.     Conjunctiva/sclera: Conjunctivae normal.  Cardiovascular:     Rate and Rhythm: Normal rate and regular rhythm.     Pulses: Normal pulses.     Heart sounds: No murmur heard.   Pulmonary:     Effort: No respiratory distress.     Breath sounds: Rhonchi present. No wheezing or rales.  Chest:     Chest wall: No tenderness.  Abdominal:     Palpations: Abdomen is soft.     Tenderness: There is abdominal tenderness. There is no right CVA  tenderness, left CVA tenderness, guarding or rebound.  Musculoskeletal:        General: No  tenderness.     Cervical back: Neck supple. No tenderness.     Right lower leg: Edema present.     Left lower leg: Edema present.  Skin:    General: Skin is warm and dry.     Capillary Refill: Capillary refill takes less than 2 seconds.     Findings: Erythema present.  Neurological:     Mental Status: She is alert.     Sensory: No sensory deficit.     Motor: No weakness.  Psychiatric:        Mood and Affect: Mood and affect and mood normal.     ED Results / Procedures / Treatments   Labs (all labs ordered are listed, but only abnormal results are displayed) Labs Reviewed  COMPREHENSIVE METABOLIC PANEL - Abnormal; Notable for the following components:      Result Value   Sodium 126 (*)    Chloride 96 (*)    CO2 20 (*)    BUN 55 (*)    Creatinine, Ser 2.19 (*)    Calcium 8.4 (*)    Total Protein 6.0 (*)    Albumin 1.9 (*)    GFR, Estimated 25 (*)    All other components within normal limits  CBC WITH DIFFERENTIAL/PLATELET - Abnormal; Notable for the following components:   RBC 2.75 (*)    Hemoglobin 8.5 (*)    HCT 27.1 (*)    RDW 20.0 (*)    All other components within normal limits  PROTIME-INR - Abnormal; Notable for the following components:   Prothrombin Time 28.9 (*)    INR 2.8 (*)    All other components within normal limits  BRAIN NATRIURETIC PEPTIDE - Abnormal; Notable for the following components:   B Natriuretic Peptide 188.6 (*)    All other components within normal limits  RESP PANEL BY RT-PCR (FLU A&B, COVID) ARPGX2  CULTURE, BLOOD (ROUTINE X 2)  CULTURE, BLOOD (ROUTINE X 2)  URINE CULTURE  MRSA PCR SCREENING  LACTIC ACID, PLASMA  LACTIC ACID, PLASMA  LIPASE, BLOOD  URINALYSIS, ROUTINE W REFLEX MICROSCOPIC  BLOOD GAS, ARTERIAL    EKG EKG Interpretation  Date/Time:  Monday January 20 2021 13:49:55 EST Ventricular Rate:  81 PR Interval:    QRS Duration: 90 QT Interval:  381 QTC Calculation: 443 R Axis:   87 Text Interpretation: Atrial  fibrillation Borderline right axis deviation Minimal ST depression 12 Lead; [place] When compared to prior, t wave inversion present in lead V3. no STEMI Confirmed by Antony Blackbird [phone]) on 12/28/2020 5:47:19 PM   Radiology CT ABDOMEN PELVIS WO CONTRAST  Result Date: 01/02/2021 CLINICAL DATA:  Respiratory failure, history of kidney transplant, recent abdominal surgery with drainage EXAM: CT CHEST, ABDOMEN AND PELVIS WITHOUT CONTRAST TECHNIQUE: Multidetector CT imaging of the chest, abdomen and pelvis was performed following the standard protocol without IV contrast. Unenhanced CT was performed per clinician order. Lack of IV contrast limits sensitivity and specificity, especially for evaluation of abdominal/pelvic solid viscera. COMPARISON:  05/14/2020, 12/29/2020 FINDINGS: CT CHEST FINDINGS Cardiovascular: Unenhanced imaging of the heart and great vessels demonstrates no pericardial effusion. Normal caliber of the thoracic aorta. Minimal atherosclerosis. Incidental note is made of prominent vascular structures in the left upper extremity consistent with previous AV fistula compatible with history of renal failure. Mediastinum/Nodes: No enlarged mediastinal, hilar, or axillary lymph nodes. Thyroid gland, trachea, and esophagus demonstrate  no significant findings. Lungs/Pleura: There are small bilateral pleural effusions, right greater than left. Bilateral lower lobe atelectasis greatest in the right lower lobe. Patchy lingular consolidation may reflect hypoventilatory change versus early infection. No pneumothorax. Central airways are patent. Musculoskeletal: Prior healed bilateral rib fractures. No acute or destructive bony lesions. Reconstructed images demonstrate no additional findings. CT ABDOMEN PELVIS FINDINGS Hepatobiliary: Multiple hepatic cysts are again noted. Otherwise unremarkable unenhanced evaluation of the liver. Small gallstones are identified within the gallbladder with no evidence of  acute cholecystitis. Pancreas: Unremarkable. No pancreatic ductal dilatation or surrounding inflammatory changes. Spleen: Mild splenomegaly unchanged.  No focal abnormalities. Adrenals/Urinary Tract: Numerous cysts are seen throughout the native kidneys, unchanged. Right lower quadrant renal transplant is unremarkable. No urinary tract calculi or obstructive uropathy. Bladder is unremarkable without filling defect. The adrenals are normal. Stomach/Bowel: No bowel obstruction or ileus. Diverting colostomy left lower quadrant is noted. Minimal diverticulosis of the remaining sigmoid colon. There is a fluid collection in the left lower quadrant measuring 3.3 x 2.8 cm on image 92/7, and extending approximately 5.3 cm in craniocaudal direction image 127/4. Fluid collection abuts the enterotomy staple line in the left lower quadrant, and could reflect postoperative seroma or abscess. Evaluation is limited without IV and oral contrast. No bowel wall thickening.  Normal appendix right lower quadrant. Vascular/Lymphatic: Aortic atherosclerosis. No enlarged abdominal or pelvic lymph nodes. Reproductive: Uterus and bilateral adnexa are unremarkable. Other: Postsurgical changes are seen from midline laparotomy, with residual surgical defect and packing. No subcutaneous fluid collection. There is a left inguinal hernia containing a small amount of fluid, though decreased since prior study. No evidence of bowel herniation. Aside from the left lower quadrant fluid collection described above, no additional areas of intra-abdominal fluid or free gas. Musculoskeletal: Postsurgical changes from left hip arthroplasty. There are no acute or destructive bony lesions. Reconstructed images demonstrate no additional findings. IMPRESSION: 1. Fluid collection left lower quadrant abutting the staple line of the sigmoid colon. Findings could reflect postoperative seroma or abscess. Evaluation limited without IV and oral contrast. 2. Small  bilateral pleural effusions and bilateral lower lobe atelectasis, right greater than left. 3. Patchy non dependent consolidation within the lingula, which could reflect hypoventilatory change or early infection. 4. Sequela of end-stage renal disease, with unremarkable transplant kidney right lower quadrant. 5. Postsurgical changes from midline laparotomy and left lower quadrant colostomy. Surgical defect from the midline laparotomy without fluid collection or abdominal wall abscess. 6. Stable splenomegaly. 7.  Aortic Atherosclerosis (ICD10-I70.0). Electronically Signed   By: Randa Ngo M.D.   On: 01/13/2021 15:14   CT Chest Wo Contrast  Result Date: 01/05/2021 CLINICAL DATA:  Respiratory failure, history of kidney transplant, recent abdominal surgery with drainage EXAM: CT CHEST, ABDOMEN AND PELVIS WITHOUT CONTRAST TECHNIQUE: Multidetector CT imaging of the chest, abdomen and pelvis was performed following the standard protocol without IV contrast. Unenhanced CT was performed per clinician order. Lack of IV contrast limits sensitivity and specificity, especially for evaluation of abdominal/pelvic solid viscera. COMPARISON:  05/14/2020, 12/29/2020 FINDINGS: CT CHEST FINDINGS Cardiovascular: Unenhanced imaging of the heart and great vessels demonstrates no pericardial effusion. Normal caliber of the thoracic aorta. Minimal atherosclerosis. Incidental note is made of prominent vascular structures in the left upper extremity consistent with previous AV fistula compatible with history of renal failure. Mediastinum/Nodes: No enlarged mediastinal, hilar, or axillary lymph nodes. Thyroid gland, trachea, and esophagus demonstrate no significant findings. Lungs/Pleura: There are small bilateral pleural effusions, right greater than left. Bilateral  lower lobe atelectasis greatest in the right lower lobe. Patchy lingular consolidation may reflect hypoventilatory change versus early infection. No pneumothorax. Central  airways are patent. Musculoskeletal: Prior healed bilateral rib fractures. No acute or destructive bony lesions. Reconstructed images demonstrate no additional findings. CT ABDOMEN PELVIS FINDINGS Hepatobiliary: Multiple hepatic cysts are again noted. Otherwise unremarkable unenhanced evaluation of the liver. Small gallstones are identified within the gallbladder with no evidence of acute cholecystitis. Pancreas: Unremarkable. No pancreatic ductal dilatation or surrounding inflammatory changes. Spleen: Mild splenomegaly unchanged.  No focal abnormalities. Adrenals/Urinary Tract: Numerous cysts are seen throughout the native kidneys, unchanged. Right lower quadrant renal transplant is unremarkable. No urinary tract calculi or obstructive uropathy. Bladder is unremarkable without filling defect. The adrenals are normal. Stomach/Bowel: No bowel obstruction or ileus. Diverting colostomy left lower quadrant is noted. Minimal diverticulosis of the remaining sigmoid colon. There is a fluid collection in the left lower quadrant measuring 3.3 x 2.8 cm on image 92/7, and extending approximately 5.3 cm in craniocaudal direction image 127/4. Fluid collection abuts the enterotomy staple line in the left lower quadrant, and could reflect postoperative seroma or abscess. Evaluation is limited without IV and oral contrast. No bowel wall thickening.  Normal appendix right lower quadrant. Vascular/Lymphatic: Aortic atherosclerosis. No enlarged abdominal or pelvic lymph nodes. Reproductive: Uterus and bilateral adnexa are unremarkable. Other: Postsurgical changes are seen from midline laparotomy, with residual surgical defect and packing. No subcutaneous fluid collection. There is a left inguinal hernia containing a small amount of fluid, though decreased since prior study. No evidence of bowel herniation. Aside from the left lower quadrant fluid collection described above, no additional areas of intra-abdominal fluid or free gas.  Musculoskeletal: Postsurgical changes from left hip arthroplasty. There are no acute or destructive bony lesions. Reconstructed images demonstrate no additional findings. IMPRESSION: 1. Fluid collection left lower quadrant abutting the staple line of the sigmoid colon. Findings could reflect postoperative seroma or abscess. Evaluation limited without IV and oral contrast. 2. Small bilateral pleural effusions and bilateral lower lobe atelectasis, right greater than left. 3. Patchy non dependent consolidation within the lingula, which could reflect hypoventilatory change or early infection. 4. Sequela of end-stage renal disease, with unremarkable transplant kidney right lower quadrant. 5. Postsurgical changes from midline laparotomy and left lower quadrant colostomy. Surgical defect from the midline laparotomy without fluid collection or abdominal wall abscess. 6. Stable splenomegaly. 7.  Aortic Atherosclerosis (ICD10-I70.0). Electronically Signed   By: Randa Ngo M.D.   On: 01/04/2021 15:14   DG Chest Portable 1 View  Result Date: 12/27/2020 CLINICAL DATA:  Hypoxia EXAM: PORTABLE CHEST 1 VIEW COMPARISON:  01/01/2021 FINDINGS: Central line is been removed since the prior study. Improved aeration in the bases with residual mild bibasilar airspace disease likely atelectasis. Negative for edema or effusion. IMPRESSION: Mild bibasilar atelectasis Electronically Signed   By: Franchot Gallo M.D.   On: 01/17/2021 14:04    Procedures Procedures   CRITICAL CARE Performed by: Gwenyth Allegra Ellias Mcelreath Total critical care time: 45 minutes Critical care time was exclusive of separately billable procedures and treating other patients. Critical care was necessary to treat or prevent imminent or life-threatening deterioration. Critical care was time spent personally by me on the following activities: development of treatment plan with patient and/or surrogate as well as nursing, discussions with consultants,  evaluation of patient's response to treatment, examination of patient, obtaining history from patient or surrogate, ordering and performing treatments and interventions, ordering and review of laboratory studies, ordering and  review of radiographic studies, pulse oximetry and re-evaluation of patient's condition.   Medications Ordered in ED Medications  hydrocortisone sodium succinate (SOLU-CORTEF) 100 MG injection 50 mg (50 mg Intravenous Given 01/05/2021 1742)  albumin human 5 % solution 12.5 g (has no administration in time range)  0.9 %  sodium chloride infusion (has no administration in time range)  vancomycin (VANCOCIN) IVPB 1000 mg/200 mL premix (has no administration in time range)  ceFEPIme (MAXIPIME) 2 g in sodium chloride 0.9 % 100 mL IVPB (has no administration in time range)  sodium chloride 0.9 % bolus 500 mL (0 mLs Intravenous Stopped 01/11/2021 1545)  ceFEPIme (MAXIPIME) 2 g in sodium chloride 0.9 % 100 mL IVPB (0 g Intravenous Stopped 01/09/2021 1625)  metroNIDAZOLE (FLAGYL) IVPB 500 mg (0 mg Intravenous Stopped 01/04/2021 1730)  sodium chloride 0.9 % bolus 500 mL (0 mLs Intravenous Stopped 01/11/2021 1746)    ED Course  I have reviewed the triage vital signs and the nursing notes.  Pertinent labs & imaging results that were available during my care of the patient were reviewed by me and considered in my medical decision making (see chart for details).    MDM Rules/Calculators/A&P                          TAHIRA OLIVAREZ is a 65 y.o. female with a complicated past medical history including ESRD status post renal transplant immunosuppression, hypertension, hyperlipidemia, CHF, asthma on nasal cannula oxygen segmentation at baseline, GERD, and diverticulitis with recent admission for perforated diverticulitis, sepsis, status post ex lap and colostomy discharged 6 days ago who presents from nursing facility with altered mental status, hypoxia on home oxygen amount, abdominal discomfort with  foul-smelling green purulence coming from the wound, and malaise. According to EMS, they found the patient minimally responsive with the nasal cannula oxygen at 4 L at baseline with a sat of 74%. They placed a nonrebreather on top of this oxygen and her oxygen improved into the 90s. Patient's mental status also began to improve and she was able answer some questions. Patient is denying any fevers he does report some chills. She was afebrile on arrival initially. She was hypotensive with blood pressure in the 70s and 80s on arrival to the emergency department. Patient does report having some swelling in her legs slightly worse than baseline. She denies any chest pain but agrees with some shortness of breath. She also agrees to a dry cough. She reports some central abdominal pain but denies nausea or vomiting. She denies urinary symptoms. She is somewhat somnolent but will arouse with questions.  EMS reports that the facility was not able provide further information but a chaplain who was there says that she last saw the patient around 3 PM yesterday and when checking on her today found her minimally responsive. Unclear what time patient's altered mental status and hypoxia began.  On exam, patient does have abdominal tenderness near her surgical wound. The dressing was taken down and there was a green purulence soaking the bandages and on the site. Bowel sounds were appreciated. Ostomy appears to be in place with stool in the bag. I do not see a significant mount of bleeding in the stool. Lungs otherwise had some coarseness and chest was nontender. Patient is more alert now being on the oxygen. She does have edema in both legs which she reports is worse. She was able to move all extremities and has normal sensation throughout.  Pupils are symmetric and reactive. She denies any headache or neck pain.  On arrival, patient is hypotensive and hypoxic and tachypneic but she is not tachycardic or febrile initially. We  will get a rectal temp to look for fever given the possible source of infection with a cough with rhonchi and recent abdominal surgery with purulence and foul smell at the site.  We will give a small amount of fluids for the hypotension as her pressure is in the 80s but will be careful not to overload the patient with fluids given her history of heart failure. We will get a BNP. We will get a CT without contrast due to her kidney transplant of the chest abdomen pelvis to look for infection. We will get a portable chest initially. Will get screening labs including blood cultures urinalysis and urine cultures. We will get a rectal temperature to determine if patient will need to be made a code sepsis or not.  Anticipate admission for hypoxia and hypotension. Anticipate calling general surgery if there are abnormalities seen on the CT scan for the abdomen near the surgical site.  4:10 PM Just spoke with general surgery, they will come see the patient but due to her complex nature and possible aspiration, they requested medicine admission.  Will call medicine for admission.    Final Clinical Impression(s) / ED Diagnoses Final diagnoses:  Hypoxia  Abdominal pain, unspecified abdominal location  Hypotension, unspecified hypotension type     Clinical Impression: 1. Hypoxia   2. Abdominal pain, unspecified abdominal location   3. Hypotension, unspecified hypotension type     Disposition: Admit  This note was prepared with assistance of Dragon voice recognition software. Occasional wrong-word or sound-a-like substitutions may have occurred due to the inherent limitations of voice recognition software.      Geordan Xu, Gwenyth Allegra, MD 12/26/2020 [phone]

## 2021-01-20 NOTE — H&P (Addendum)
History and Physical    ERIKA SLABY PYP:[id number] DOB: September 04, 1956 DOA: 12/26/2020  PCP: [name], MD  Patient coming from: SNF   Chief Complaint: Andre Lefort, hypoxic   HPI: April [name] is a 65 y.o. female with medical history significant of paroxysmal atrial fibrillation status post DCCV, chronic hypoxic respiratory failure on 2 L at baseline, severe persistent asthma, suspected OSA, CKD stage IIIa with previous history of ESRD leading to renal transplant, chronic diastolic heart failure, insulin-dependent diabetes, Raynaud's phenomenon, with recent hospitalization for perforated diverticulitis, septic shock who underwent ex lap and colostomy who now presents to the hospital after being found unresponsive at SNF.  She was found to have altered mental status, minimally responsive with oxygen saturation of 74% on 4 L O2.  She required nonrebreather.  Her mental status began to improve a little bit.  She was found to be hypotensive with SBP in the 70s to 80s.  Due to concern for septic shock, CT chest abdomen pelvis which was significant for possible postop seroma versus developing abscess.  She was started on IV antibiotics.  General surgery consulted and TRH called for admission.  History unable to be gathered by patient as she remains minimally responsive.  Daughter at bedside confirms history, patient has not recovered very well at SNF.  Oral intake has remained poor.  Patient had no specific complaints, but was found to be minimally responsive at SNF placement.  Review of Systems: Unable to obtain due to patient's mental status  Past Medical History:  Diagnosis Date  . Arthritis   . Asthma   . CHF (congestive heart failure) ([place])   . COPD (chronic obstructive pulmonary disease) ([place])   . Depression   . Essential hypertension 02/08/2020  . GERD (gastroesophageal reflux disease)   . Hyperlipidemia   . Hyperparathyroidism   . Polycystic kidney   . PONV (postoperative  nausea and vomiting)     Past Surgical History:  Procedure Laterality Date  . 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 BILATERAL OOPHORECTOMY    . BIOPSY  05/19/2020   Procedure: BIOPSY;  Surgeon: Carol Ada, MD;  Location: [place];  Service: Endoscopy;;  . CARDIOVERSION N/A 12/25/2020   Procedure: CARDIOVERSION;  Surgeon: Geralynn Rile, MD;  Location: [place];  Service: Cardiovascular;  Laterality: N/A;  . CARPAL TUNNEL RELEASE  2000  . CESAREAN SECTION    . COLONOSCOPY WITH PROPOFOL N/A 05/19/2020   Procedure: COLONOSCOPY WITH PROPOFOL;  Surgeon: Carol Ada, MD;  Location: [place];  Service: Endoscopy;  Laterality: N/A;  . ENTEROSCOPY N/A 05/19/2020   Procedure: ENTEROSCOPY;  Surgeon: Carol Ada, MD;  Location: [place];  Service: Endoscopy;  Laterality: N/A;  . INSERTION OF DIALYSIS CATHETER  03/31/2012   Procedure: INSERTION OF DIALYSIS CATHETER;  Surgeon: Angelia Mould, MD;  Location: [place];  Service: Vascular;  Laterality: N/A;  insertion of dialysis catheter right internal jugular vein  . 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 LAPAROTOMY N/A 12/29/2020   Procedure: EXPLORATORY LAPAROTOMY, SIGMOID COLECTOMY AND COLOSTOMY;  Surgeon: Coralie Keens, MD;  Location: [facility];  Service: General;  Laterality: N/A;  Packed Dressing  . ORIF TIBIA FRACTURE Left 12/23/2018   Procedure: OPEN REDUCTION INTERNAL FIXATION (ORIF) TIBIA FRACTURE;  Surgeon: Shona Needles, MD;  Location: [place];  Service: Orthopedics;  Laterality: Left;  . ORIF TIBIA PLATEAU Right 12/23/2018   Procedure: OPEN REDUCTION INTERNAL FIXATION (ORIF) TIBIAL PLATEAU;  Surgeon:  Haddix, Thomasene Lot, MD;  Location: [place];  Service: Orthopedics;  Laterality: Right;  . ORIF WRIST FRACTURE Left 12/08/2019   Procedure: OPEN REDUCTION INTERNAL FIXATION (ORIF) WRIST FRACTURE, REPAIR LACERATION LEFT FOREARM;  Surgeon: Dorna Leitz, MD;  Location[facility];  Service: Orthopedics;   Laterality: Left;  . TEE WITHOUT CARDIOVERSION N/A 12/25/2020   Procedure: TRANSESOPHAGEAL ECHOCARDIOGRAM (TEE);  Surgeon: Geralynn Rile, MD;  Location: [place];  Service: Cardiovascular;  Laterality: N/A;  . [place]  . TOTAL HIP ARTHROPLASTY Left 12/08/2019   Procedure: TOTAL HIP ARTHROPLASTY ANTERIOR APPROACH;  Surgeon: Dorna Leitz, MD;  Location[facility];  Service: Orthopedics;  Laterality: Left;  . TUBAL LIGATION  2010     reports that she quit smoking about 22 years ago. Her smoking use included cigarettes. She has a 3.75 pack-year smoking history. She has never used smokeless tobacco. She reports that she does not drink alcohol and does not use drugs.  Allergies  Allergen Reactions  . Infed [Iron Dextran] Other (See Comments)    Chest tightness  . Pentamidine Itching, Shortness Of Breath and Swelling  . Budesonide-Formoterol Fumarate     Other reaction(s): hoarseness  . Bupropion     Other reaction(s): exacerbated depression  . Crestor [Rosuvastatin]     Other reaction(s): body aches, swelling  . Duloxetine Hcl     Other reaction(s): GI SE, weepy, worsening fatigue, foggy  . Erythromycin [Erythromycin] Other (See Comments)    Mouth Ulcers  . Iohexol Other (See Comments)    Per patient "she has had a kidney transplant and should never have contrast"  . Oxycodone Nausea Only and Nausea And Vomiting  . Pravastatin Sodium     Other reaction(s): myalgias  . Erythromycin Rash    Causes breakout in mouth  . Ultram [Tramadol Hcl] Anxiety    Family History  Problem Relation Age of Onset  . Hypertension Mother   . Heart disease Father        CABG history  . Polycystic kidney disease Father   . Polycystic kidney disease Brother      Prior to Admission medications   Medication Sig Start Date End Date Taking? Authorizing Provider  acetaminophen (TYLENOL) 325 MG tablet Take 2 tablets (650 mg total) by mouth every 6 (six) hours as needed for mild pain (or  Fever >/= 101). 05/22/20   Elgergawy, Silver Huguenin, MD  albuterol (VENTOLIN HFA) 108 (90 Base) MCG/ACT inhaler Inhale 2 puffs into the lungs every 4 (four) hours as needed for wheezing or shortness of breath. 01/04/20   Angiulli, Lavon Paganini, PA-C  allopurinol (ZYLOPRIM) 100 MG tablet Take 100 mg by mouth daily. 11/29/20   [provider]  amLODipine (NORVASC) 5 MG tablet Take 1 tablet (5 mg total) by mouth daily. 01/15/21   Mercy Riding, MD  apixaban (ELIQUIS) 5 MG TABS tablet Take 1 tablet (5 mg total) by mouth 2 (two) times daily. 01/14/21   Mercy Riding, MD  brimonidine (ALPHAGAN P) 0.1 % SOLN Place 1 drop into both eyes 2 (two) times daily.     [provider]  Budeson-Glycopyrrol-Formoterol (BREZTRI AEROSPHERE) 160-9-4.8 MCG/ACT AERO Inhale 2 puffs into the lungs 2 (two) times daily. 08/20/20   Martyn Ehrich, NP  cholecalciferol (VITAMIN D3) 25 MCG (1000 UNIT) tablet Take 1 tablet (1,000 Units total) by mouth daily. 01/04/20   Angiulli, Lavon Paganini, PA-C  colchicine 0.6 MG tablet Take 0.5 tablets (0.3 mg total) by mouth daily. 01/15/21  Mercy Riding, MD  cyanocobalamin 1000 MCG tablet Take 1,000 mcg by mouth daily.    [provider]  cycloSPORINE (RESTASIS) 0.05 % ophthalmic emulsion Place 1 drop into both eyes 2 (two) times daily. 01/04/20   Angiulli, Lavon Paganini, PA-C  dorzolamide (TRUSOPT) 2 % ophthalmic solution Place 1 drop into both eyes daily. 06/25/19   [provider]  feeding supplement (ENSURE ENLIVE / ENSURE PLUS) LIQD Take 237 mLs by mouth 2 (two) times daily between meals. 01/14/21   Mercy Riding, MD  ferrous sulfate 325 (65 FE) MG tablet Take 325 mg by mouth daily with breakfast.    [provider]  FLUoxetine (PROZAC) 40 MG capsule Take 1 capsule (40 mg total) by mouth daily. 01/04/20   Angiulli, Lavon Paganini, PA-C  furosemide (LASIX) 40 MG tablet Take 1 tablet (40 mg total) by mouth 2 (two) times daily. 01/14/21   Mercy Riding, MD  gabapentin  (NEURONTIN) 100 MG capsule Take 200 mg by mouth 2 (two) times daily.    [provider]  guaiFENesin (MUCINEX) 600 MG 12 hr tablet Take 600 mg by mouth 2 (two) times daily.    [provider]  insulin aspart (NOVOLOG) 100 UNIT/ML injection Inject 0-9 Units into the skin 3 (three) times daily with meals. CBG 70 - 120: 0 units CBG 121 - 150: 1 unit CBG 151 - 200: 2 units CBG 201 - 250: 3 units CBG 251 - 300: 5 units CBG 301 - 350: 7 units CBG 351 - 400: 9 units 01/14/21   Gonfa, Taye T, MD  insulin glargine (LANTUS) 100 UNIT/ML injection Inject 0.05 mLs (5 Units total) into the skin 2 (two) times daily. 01/14/21   Mercy Riding, MD  ipratropium-albuterol (DUONEB) 0.5-2.5 (3) MG/3ML SOLN Take 3 mLs by nebulization every 6 (six) hours as needed (sob/wheezing).  08/24/20   [provider]  metoprolol tartrate (LOPRESSOR) 25 MG tablet Take 1 tablet (25 mg total) by mouth 2 (two) times daily. 01/14/21   Mercy Riding, MD  mycophenolate (MYFORTIC) 180 MG EC tablet Take 180 mg by mouth 2 (two) times daily.    [provider]  Nutritional Supplements (,FEEDING SUPPLEMENT, PROSOURCE PLUS) liquid Take 30 mLs by mouth 2 (two) times daily between meals. 01/14/21   Mercy Riding, MD  ondansetron (ZOFRAN) 4 MG tablet Take 1 tablet (4 mg total) by mouth every 8 (eight) hours as needed for nausea or vomiting. 01/14/21 01/14/22  Mercy Riding, MD  oxyCODONE (OXY IR/ROXICODONE) 5 MG immediate release tablet Take 1 tablet (5 mg total) by mouth every 6 (six) hours as needed for moderate pain or severe pain (Unresponsive to acetaminophen). 01/14/21   Mercy Riding, MD  pantoprazole (PROTONIX) 40 MG tablet Take 1 tablet (40 mg total) by mouth daily. 01/15/21   Mercy Riding, MD  predniSONE (DELTASONE) 2.5 MG tablet Take 3 tablets (7.5 mg total) by mouth daily with breakfast. 01/15/21   Mercy Riding, MD  PROGRAF 1 MG capsule Take 1 mg by mouth 2 (two) times daily.  03/22/19   [provider]  promethazine (PHENERGAN) 25 MG tablet Take 1 tablet (25 mg total) by mouth every 8 (eight) hours as needed for up to 15 doses for nausea or vomiting. 09/02/20   Curatolo, Adam, DO  sodium hypochlorite (DAKIN'S 1/4 STRENGTH) 0.125 % SOLN Irrigate with as directed 2 (two) times daily. 01/14/21   Mercy Riding, MD  Dorette Grate  Z 0.004 % SOLN ophthalmic solution Place 1 drop into both eyes at bedtime. 07/03/16   [provider]  valACYclovir (VALTREX) 500 MG tablet Take 500 mg by mouth 2 (two) times daily.    [provider]    Physical Exam: Vitals:   01/08/2021 1530 01/19/2021 1533 12/28/2020 1545 01/02/2021 1600  BP: (!) 85/70 96/64 (!) 88/57 (!) 86/55  Pulse: 80 85 61   Resp: (!) 21 14 17  (!) 22  Temp:      TempSrc:      SpO2: 100% 93% 90%      Constitutional: NAD, calm, comfortable Eyes: PERRL, lids and conjunctivae normal ENMT: Deferred due to NRB  Respiratory: Clear to auscultation anteriorly, no wheezing, no crackles. On 4L O2 as well as NRB   Cardiovascular: Regular rate and rhythm, no murmurs. +Pitting extremity edema.  Abdomen: Soft, nondistended Musculoskeletal: No joint deformity upper and lower extremities. No contractures. Normal muscle tone. +erythema bilateral elbows, +dusky digits consistent with hx of Raynaud's  Neurologic: Alert to voice only    Labs on Admission: I have personally reviewed following labs and imaging studies  CBC: Recent Labs  Lab 01/14/21 0520 12/24/2020 1339  WBC 3.2* 6.8  NEUTROABS  --  PENDING  HGB 8.9* 8.5*  HCT 27.5* 27.1*  MCV 100.4* 98.5  PLT 180 528   Basic Metabolic Panel: Recent Labs  Lab 01/14/21 0520 01/18/2021 1339  NA  --  126*  K  --  4.7  CL  --  96*  CO2  --  20*  GLUCOSE  --  75  BUN  --  55*  CREATININE  --  2.19*  CALCIUM  --  8.4*  MG 2.2  --    GFR: Estimated Creatinine Clearance: 23.9 mL/min (A) (by C-G formula based on SCr of 2.19 mg/dL (H)). Liver Function Tests: Recent Labs  Lab  12/24/2020 1339  AST 25  ALT 8  ALKPHOS 94  BILITOT 1.2  PROT 6.0*  ALBUMIN 1.9*   Recent Labs  Lab 01/19/2021 1343  LIPASE 16   No results for input(s): AMMONIA in the last 168 hours. Coagulation Profile: Recent Labs  Lab 12/26/2020 1339  INR 2.8*   Cardiac Enzymes: No results for input(s): CKTOTAL, CKMB, CKMBINDEX, TROPONINI in the last 168 hours. BNP (last 3 results) No results for input(s): PROBNP in the last 8760 hours. HbA1C: No results for input(s): HGBA1C in the last 72 hours. CBG: Recent Labs  Lab 01/13/21 2122 01/14/21 0747 01/14/21 1150 01/14/21 1615  GLUCAP 190* 188* 233* 228*   Lipid Profile: No results for input(s): CHOL, HDL, LDLCALC, TRIG, CHOLHDL, LDLDIRECT in the last 72 hours. Thyroid Function Tests: No results for input(s): TSH, T4TOTAL, FREET4, T3FREE, THYROIDAB in the last 72 hours. Anemia Panel: No results for input(s): VITAMINB12, FOLATE, FERRITIN, TIBC, IRON, RETICCTPCT in the last 72 hours. Urine analysis:    Component Value Date/Time   COLORURINE YELLOW 12/24/2020 1700   APPEARANCEUR CLEAR 12/24/2020 1700   LABSPEC 1.015 12/24/2020 1700   PHURINE 5.0 12/24/2020 1700   GLUCOSEU 50 (A) 12/24/2020 1700   HGBUR NEGATIVE 12/24/2020 1700   BILIRUBINUR NEGATIVE 12/24/2020 1700   KETONESUR NEGATIVE 12/24/2020 1700   PROTEINUR NEGATIVE 12/24/2020 1700   UROBILINOGEN 0.2 05/15/2013 0733   NITRITE NEGATIVE 12/24/2020 1700   LEUKOCYTESUR NEGATIVE 12/24/2020 1700   Sepsis Labs: !!!!!!!!!!!!!!!!!!!!!!!!!!!!!!!!!!!!!!!!!!!! @LABRCNTIP (procalcitonin:4,lacticidven:4) ) Recent Results (from the past 240 hour(s))  SARS CORONAVIRUS 2 (TAT 6-24 HRS) Nasopharyngeal Nasopharyngeal Swab  Status: None   Collection Time: 01/14/21 12:39 AM   Specimen: Nasopharyngeal Swab  Result Value Ref Range Status   SARS Coronavirus 2 NEGATIVE NEGATIVE Final    Comment: (NOTE) SARS-CoV-2 target nucleic acids are NOT DETECTED.  The SARS-CoV-2 RNA is generally  detectable in upper and lower respiratory specimens during the acute phase of infection. Negative results do not preclude SARS-CoV-2 infection, do not rule out co-infections with other pathogens, and should not be used as the sole basis for treatment or other patient management decisions. Negative results must be combined with clinical observations, patient history, and epidemiological information. The expected result is Negative.  Fact Sheet for Patients: SugarRoll.be  Fact Sheet for Healthcare Providers: https://www.woods-mathews.com/  This test is not yet approved or cleared by the [place] FDA and  has been authorized for detection and/or diagnosis of SARS-CoV-2 by FDA under an Emergency Use Authorization (EUA). This EUA will remain  in effect (meaning this test can be used) for the duration of the COVID-19 declaration under Se ction 564(b)(1) of the Act, 21 U.S.C. section 360bbb-3(b)(1), unless the authorization is terminated or revoked sooner.  Performed at [facility], [place] [address], [place], [place] [address]   Resp Panel by RT-PCR (Flu A&B, Covid) Nasopharyngeal Swab     Status: None   Collection Time: 12/31/2020  2:05 PM   Specimen: Nasopharyngeal Swab; Nasopharyngeal(NP) swabs in vial transport medium  Result Value Ref Range Status   SARS Coronavirus 2 by RT PCR NEGATIVE NEGATIVE Final    Comment: (NOTE) SARS-CoV-2 target nucleic acids are NOT DETECTED.  The SARS-CoV-2 RNA is generally detectable in upper respiratory specimens during the acute phase of infection. The lowest concentration of SARS-CoV-2 viral copies this assay can detect is 138 copies/mL. A negative result does not preclude SARS-Cov-2 infection and should not be used as the sole basis for treatment or other patient management decisions. A negative result may occur with  improper specimen collection/handling, submission of specimen other than  nasopharyngeal swab, presence of viral mutation(s) within the areas targeted by this assay, and inadequate number of viral copies(<138 copies/mL). A negative result must be combined with clinical observations, patient history, and epidemiological information. The expected result is Negative.  Fact Sheet for Patients:  EntrepreneurPulse.com.au  Fact Sheet for Healthcare Providers:  IncredibleEmployment.be  This test is no t yet approved or cleared by the [place] FDA and  has been authorized for detection and/or diagnosis of SARS-CoV-2 by FDA under an Emergency Use Authorization (EUA). This EUA will remain  in effect (meaning this test can be used) for the duration of the COVID-19 declaration under Section 564(b)(1) of the Act, 21 U.S.C.section 360bbb-3(b)(1), unless the authorization is terminated  or revoked sooner.       Influenza A by PCR NEGATIVE NEGATIVE Final   Influenza B by PCR NEGATIVE NEGATIVE Final    Comment: (NOTE) The Xpert Xpress SARS-CoV-2/FLU/RSV plus assay is intended as an aid in the diagnosis of influenza from Nasopharyngeal swab specimens and should not be used as a sole basis for treatment. Nasal washings and aspirates are unacceptable for Xpert Xpress SARS-CoV-2/FLU/RSV testing.  Fact Sheet for Patients: EntrepreneurPulse.com.au  Fact Sheet for Healthcare Providers: IncredibleEmployment.be  This test is not yet approved or cleared by the [place] FDA and has been authorized for detection and/or diagnosis of SARS-CoV-2 by FDA under an Emergency Use Authorization (EUA). This EUA will remain in effect (meaning this test can be used) for the duration of  the COVID-19 declaration under Section 564(b)(1) of the Act, 21 U.S.C. section 360bbb-3(b)(1), unless the authorization is terminated or revoked.  Performed at [facility] [place] [address] [place], [place] [address]      Radiological Exams on Admission: CT ABDOMEN PELVIS WO CONTRAST  Result Date: 01/17/2021 CLINICAL DATA:  Respiratory failure, history of kidney transplant, recent abdominal surgery with drainage EXAM: CT CHEST, ABDOMEN AND PELVIS WITHOUT CONTRAST TECHNIQUE: Multidetector CT imaging of the chest, abdomen and pelvis was performed following the standard protocol without IV contrast. Unenhanced CT was performed per clinician order. Lack of IV contrast limits sensitivity and specificity, especially for evaluation of abdominal/pelvic solid viscera. COMPARISON:  05/14/2020, 12/29/2020 FINDINGS: CT CHEST FINDINGS Cardiovascular: Unenhanced imaging of the heart and great vessels demonstrates no pericardial effusion. Normal caliber of the thoracic aorta. Minimal atherosclerosis. Incidental note is made of prominent vascular structures in the left upper extremity consistent with previous AV fistula compatible with history of renal failure. Mediastinum/Nodes: No enlarged mediastinal, hilar, or axillary lymph nodes. Thyroid gland, trachea, and esophagus demonstrate no significant findings. Lungs/Pleura: There are small bilateral pleural effusions, right greater than left. Bilateral lower lobe atelectasis greatest in the right lower lobe. Patchy lingular consolidation may reflect hypoventilatory change versus early infection. No pneumothorax. Central airways are patent. Musculoskeletal: Prior healed bilateral rib fractures. No acute or destructive bony lesions. Reconstructed images demonstrate no additional findings. CT ABDOMEN PELVIS FINDINGS Hepatobiliary: Multiple hepatic cysts are again noted. Otherwise unremarkable unenhanced evaluation of the liver. Small gallstones are identified within the gallbladder with no evidence of acute cholecystitis. Pancreas: Unremarkable. No pancreatic ductal dilatation or surrounding inflammatory changes. Spleen: Mild splenomegaly unchanged.  No focal  abnormalities. Adrenals/Urinary Tract: Numerous cysts are seen throughout the native kidneys, unchanged. Right lower quadrant renal transplant is unremarkable. No urinary tract calculi or obstructive uropathy. Bladder is unremarkable without filling defect. The adrenals are normal. Stomach/Bowel: No bowel obstruction or ileus. Diverting colostomy left lower quadrant is noted. Minimal diverticulosis of the remaining sigmoid colon. There is a fluid collection in the left lower quadrant measuring 3.3 x 2.8 cm on image 92/7, and extending approximately 5.3 cm in craniocaudal direction image 127/4. Fluid collection abuts the enterotomy staple line in the left lower quadrant, and could reflect postoperative seroma or abscess. Evaluation is limited without IV and oral contrast. No bowel wall thickening.  Normal appendix right lower quadrant. Vascular/Lymphatic: Aortic atherosclerosis. No enlarged abdominal or pelvic lymph nodes. Reproductive: Uterus and bilateral adnexa are unremarkable. Other: Postsurgical changes are seen from midline laparotomy, with residual surgical defect and packing. No subcutaneous fluid collection. There is a left inguinal hernia containing a small amount of fluid, though decreased since prior study. No evidence of bowel herniation. Aside from the left lower quadrant fluid collection described above, no additional areas of intra-abdominal fluid or free gas. Musculoskeletal: Postsurgical changes from left hip arthroplasty. There are no acute or destructive bony lesions. Reconstructed images demonstrate no additional findings. IMPRESSION: 1. Fluid collection left lower quadrant abutting the staple line of the sigmoid colon. Findings could reflect postoperative seroma or abscess. Evaluation limited without IV and oral contrast. 2. Small bilateral pleural effusions and bilateral lower lobe atelectasis, right greater than left. 3. Patchy non dependent consolidation within the lingula, which could  reflect hypoventilatory change or early infection. 4. Sequela of end-stage renal disease, with unremarkable transplant kidney right lower quadrant. 5. Postsurgical changes from midline laparotomy and left lower quadrant colostomy. Surgical defect from the midline laparotomy without  fluid collection or abdominal wall abscess. 6. Stable splenomegaly. 7.  Aortic Atherosclerosis (ICD10-I70.0). Electronically Signed   By: Randa Ngo M.D.   On: 01/08/2021 15:14   CT Chest Wo Contrast  Result Date: 01/06/2021 CLINICAL DATA:  Respiratory failure, history of kidney transplant, recent abdominal surgery with drainage EXAM: CT CHEST, ABDOMEN AND PELVIS WITHOUT CONTRAST TECHNIQUE: Multidetector CT imaging of the chest, abdomen and pelvis was performed following the standard protocol without IV contrast. Unenhanced CT was performed per clinician order. Lack of IV contrast limits sensitivity and specificity, especially for evaluation of abdominal/pelvic solid viscera. COMPARISON:  05/14/2020, 12/29/2020 FINDINGS: CT CHEST FINDINGS Cardiovascular: Unenhanced imaging of the heart and great vessels demonstrates no pericardial effusion. Normal caliber of the thoracic aorta. Minimal atherosclerosis. Incidental note is made of prominent vascular structures in the left upper extremity consistent with previous AV fistula compatible with history of renal failure. Mediastinum/Nodes: No enlarged mediastinal, hilar, or axillary lymph nodes. Thyroid gland, trachea, and esophagus demonstrate no significant findings. Lungs/Pleura: There are small bilateral pleural effusions, right greater than left. Bilateral lower lobe atelectasis greatest in the right lower lobe. Patchy lingular consolidation may reflect hypoventilatory change versus early infection. No pneumothorax. Central airways are patent. Musculoskeletal: Prior healed bilateral rib fractures. No acute or destructive bony lesions. Reconstructed images demonstrate no additional  findings. CT ABDOMEN PELVIS FINDINGS Hepatobiliary: Multiple hepatic cysts are again noted. Otherwise unremarkable unenhanced evaluation of the liver. Small gallstones are identified within the gallbladder with no evidence of acute cholecystitis. Pancreas: Unremarkable. No pancreatic ductal dilatation or surrounding inflammatory changes. Spleen: Mild splenomegaly unchanged.  No focal abnormalities. Adrenals/Urinary Tract: Numerous cysts are seen throughout the native kidneys, unchanged. Right lower quadrant renal transplant is unremarkable. No urinary tract calculi or obstructive uropathy. Bladder is unremarkable without filling defect. The adrenals are normal. Stomach/Bowel: No bowel obstruction or ileus. Diverting colostomy left lower quadrant is noted. Minimal diverticulosis of the remaining sigmoid colon. There is a fluid collection in the left lower quadrant measuring 3.3 x 2.8 cm on image 92/7, and extending approximately 5.3 cm in craniocaudal direction image 127/4. Fluid collection abuts the enterotomy staple line in the left lower quadrant, and could reflect postoperative seroma or abscess. Evaluation is limited without IV and oral contrast. No bowel wall thickening.  Normal appendix right lower quadrant. Vascular/Lymphatic: Aortic atherosclerosis. No enlarged abdominal or pelvic lymph nodes. Reproductive: Uterus and bilateral adnexa are unremarkable. Other: Postsurgical changes are seen from midline laparotomy, with residual surgical defect and packing. No subcutaneous fluid collection. There is a left inguinal hernia containing a small amount of fluid, though decreased since prior study. No evidence of bowel herniation. Aside from the left lower quadrant fluid collection described above, no additional areas of intra-abdominal fluid or free gas. Musculoskeletal: Postsurgical changes from left hip arthroplasty. There are no acute or destructive bony lesions. Reconstructed images demonstrate no additional  findings. IMPRESSION: 1. Fluid collection left lower quadrant abutting the staple line of the sigmoid colon. Findings could reflect postoperative seroma or abscess. Evaluation limited without IV and oral contrast. 2. Small bilateral pleural effusions and bilateral lower lobe atelectasis, right greater than left. 3. Patchy non dependent consolidation within the lingula, which could reflect hypoventilatory change or early infection. 4. Sequela of end-stage renal disease, with unremarkable transplant kidney right lower quadrant. 5. Postsurgical changes from midline laparotomy and left lower quadrant colostomy. Surgical defect from the midline laparotomy without fluid collection or abdominal wall abscess. 6. Stable splenomegaly. 7.  Aortic Atherosclerosis (ICD10-I70.0). Electronically  Signed   By: Randa Ngo M.D.   On: 01/08/2021 15:14   DG Chest Portable 1 View  Result Date: 01/04/2021 CLINICAL DATA:  Hypoxia EXAM: PORTABLE CHEST 1 VIEW COMPARISON:  01/01/2021 FINDINGS: Central line is been removed since the prior study. Improved aeration in the bases with residual mild bibasilar airspace disease likely atelectasis. Negative for edema or effusion. IMPRESSION: Mild bibasilar atelectasis Electronically Signed   By: Franchot Gallo M.D.   On: 12/29/2020 14:04    EKG: Independently reviewed. Normal sinus rhythm, wandering baseline   Assessment/Plan Principal Problem:   Acute metabolic encephalopathy Active Problems:   Renal transplant recipient   Depression   Acute respiratory failure with hypoxia (HCC)   Acute on chronic diastolic CHF (congestive heart failure) (HCC)   Acute renal failure superimposed on stage 3b chronic kidney disease (HCC)   Hypotension   Acute metabolic encephalopathy -On my exam, patient remains arousable to voice but does not remain awake for any further interaction -Per EDP report, patient's mentation had improved with initiation of nonrebreather, thought process was that  her encephalopathy was due to hypoxia -Check ABG  Fluid collection left lower quadrant, postop seroma versus abscess -Technically does not meet sepsis criteria with normal HR, temp, WBC and lactic acid. Does have low BP however -Recent hospitalization with perforated diverticulitis.  Patient underwent laparotomy with Hartman's procedure on 12/29/20.  Developed postop respiratory failure and shock and was admitted briefly in the ICU -General surgery consulted -Blood culture pending -Vancomycin, cefepime, Flagyl  Hypotension -IV fluid, Solu-Cortef.  Will give albumin due to third spacing -May require pressor  Adrenal insufficiency -Takes prednisone 7.5mg  at baseline -Solucortef ordered, wean back to home prednisone dose as tolerated by BP   Paroxysmal A Fib -Status post TEE/DCCV on 12/25/20 -Metoprolol on hold due to hypotension -Eliquis --> IV heparin for now until surgery evaluation is complete.  Cardiology has recommended uninterrupted anticoagulation for 30 days post DCCV -Monitor on telemetry  Acute on chronic hypoxic respiratory failure with severe persistent asthma, questionable HCAP -Requires 2-4 L of oxygen at baseline -SPO2 74% on arrival to the emergency department on 4 L oxygen and required nonrebreather -CT chest reveals some small bilateral pleural effusion, bilateral lower lobe atelectasis, patchy nondependent consolidation which could reflect hypoventilatory changes or early infection -Covid, influenza negative -Cefepime, vancomycin as above -Check MRSA PCR  AKI on CKD stage 3a, history of renal failure with ESRD, status post renal transplant -Baseline creatinine 1.1-1.5  -Combination of infectious process, hypotension. Patient with bladder scan > 385ml without hydronephrosis seen on CT. Foley ordered in critically ill setting -IVF  -Continue mycophenolate, prograf, solucortef as above (prednisone PTA)   Acute on chronic diastolic heart failure -Echocardiogram  revealed EF 70% with grade 2 diastolic dysfunction -BNP 188.6 -Patient remains fluid overloaded, +anasarca, with AKI and hypotension. 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  Needs outpatient sleep study  Hyponatremia -Trend.  On IV fluid  Depression -Prozac  Gout of elbows -On steroids as above. Continue allopurinol.   Goals of care -Patient remains critically ill at the time of admission.  She was very ill and had prolonged hospitalization earlier this month and was just discharged 1 week ago.  Discussed patient's illness and multiorgan dysfunction.  Daughter remained tearful, thought if this is end of life.  For now, remains full code  but I did discuss with her my recommendation for starting palliative care conversations   DVT prophylaxis: IV heparin (hold home Eliquis until surgery recommendations in)  Code Status: Full, confirmed with daughter at bedside  Family Communication: Daughter  Disposition Plan: Admit to stepdown unit, dispo pending clinical improvement, back to SNF Consults called: General surgery     Status is: Inpatient  Remains inpatient appropriate because:Hemodynamically unstable, Ongoing diagnostic testing needed not appropriate for outpatient work up, IV treatments appropriate due to intensity of illness or inability to take PO and Inpatient level of care appropriate due to severity of illness   Dispo: The patient is from: SNF              Anticipated d/c is to: SNF              Patient currently is not medically stable to d/c.   Difficult to place patient No    Severity of Illness: The appropriate patient status for this patient is INPATIENT. Inpatient status is judged to be reasonable and necessary in order to provide the required intensity of service to ensure the patient's safety. The  patient's presenting symptoms, physical exam findings, and initial radiographic and laboratory data in the context of their chronic comorbidities is felt to place them at high risk for further clinical deterioration. Furthermore, it is not anticipated that the patient will be medically stable for discharge from the hospital within 2 midnights of admission.  * I certify that at the point of admission it is my clinical judgment that the patient will require inpatient hospital care spanning beyond 2 midnights from the point of admission due to high intensity of service, high risk for further deterioration and high frequency of surveillance required.Dessa Phi, DO [facility] 01/05/2021, 4:30 PM   Available via Epic secure chat 7am-7pm After these hours, please refer to coverage provider listed on [email or web address]

## 2021-01-20 NOTE — Progress Notes (Signed)
A consult was received from an ED physician for vancomycin and cefepime per pharmacy dosing (for an indication other than meningitis). The patient's profile has been reviewed for ht/wt/allergies/indication/available labs. A one time order has been placed for the above antibiotics.  Further antibiotics/pharmacy consults should be ordered by admitting physician if indicated.                       Reuel Boom, PharmD, BCPS [phone] 01/09/2021, 3:52 PM

## 2021-01-20 NOTE — Progress Notes (Signed)
Pt admitted to ICU/SD per orders. Pt remains extremely lethargic/drowsy. Dr. Maylene Roes visualized pt at bedside. BP improving, SBP now 99 after 500 cc NS bolus. Maintenance fluids started per orders.   All wounds cleansed and redressed by this RN. Foley inserted for retention per orders. Consent for albumin obtained from patient's daughter and hung by RN. Nightshift RN will continue care of patient.

## 2021-01-20 NOTE — Consult Note (Addendum)
[facility] Surgery Consult Note  April [name] [birth year]  [id number].    Requesting MD: christopher Tegeler Chief Complaint: non-responive, hypotensive, and hypoxic, with green smelling drainage Reason for Consult:  Probable post operative abscess  HPI:  Pt admitted to [facility] on 12/16/20, with sepsis, and persistent findings of mild uncomplicated sigmoid diverticulitis.  She was started on IV antibiotics. She had multiple medical problems including: polycystic renal disease with renal transplant 2013, on Prograf and prednisone, elevated creatinine, congestive heart failure with BNP of 2872, chest x-ray with pulmonary vascular congestion, COPD on chronic home O2 age-indeterminate nonocclusive DVT involving the right popliteal vein, Hx SVT and history of hypoglycemia.  She was treated medically but we were called back on  12/29/2020.  She was seen urgently with worsening condition and CT scan showing free air.  She was seen by Dr. Coralie Keens and was his opinion she had perforated sigmoid colon and she was taken to the operating room.  She underwent exploratory laparotomy, sigmoid colectomy and end colostomy.  Patient was extubated on 01/01/2021.  Her diet was slowly advanced, she developed atrial flutter with RVR, she underwent TEE, with DC cardioversion 12/2020, and  was treated with anticoagulants.  After prolonged ileus her bowel function returned slowly.  She was hemodynamically stable and was discharged to skilled nursing facility.  Her daughter gave me the history because patient was not really able to.  She notes her appetite has not improved since she had surgery, she is having trouble swallowing most things, she has been living on Ensure and nibbling on her tray.  She appeared more confused yesterday to the family, but perked up after medicines.  Today she was found as noted above, BP currently in the 80's, she is on [place] & FM.  RR at about 35 and labored.   She is confused and has trouble  answering any questions.  She presented with the above-noted complaints in the ED today, via EMS.  Work-up in the ED shows blood pressure 84/59, heart rate 61-81.  Temperature 99.2.  Sats 400% on 15 L nonrebreather facemask.  Labs shows sodium of 126, potassium of 4.7, chloride of 96, CO2 of 20, glucose 75, BUN 55, creatinine 2.19.  LFTs are normal, total protein 6.0, albumin 1.9, INR 2.8, Covid is negative, chest x-ray shows mild bibasilar atelectasis no edema or effusion.  CTs of the chest abdomen and pelvis shows a fluid collection in the left lower quadrant measuring 3.3 x 2.8 cm x 5.3 cm.  The fluid abuts the enterotomy staple line left lower quadrant and could reflect possible seroma or abscess.  Because of the patient's creatinine, contrast was not used.  Small bilateral pleural effusions bilateral lower lobe atelectasis right greater than left.  Sequela of end-stage renal disease with unremarkable transplant kidney in the right lower quadrant.  Postsurgical changes from midline laparotomy left lower quadrant colostomy surgical defect of the midline laparotomy without fluid collection or abdominal wall abscess.  We are asked to see.   ROS: Review of Systems  Unable to perform ROS: Mental status change  Gastrointestinal:       Trouble swallowing, coughing on drinks since discharge    Family History  Problem Relation Age of Onset  . Hypertension Mother   . Heart disease Father        CABG history  . Polycystic kidney disease Father   . Polycystic kidney disease Brother     Past Medical History:  Diagnosis Date  . Arthritis   .  Asthma   . CHF (congestive heart failure) ([place])   . COPD (chronic obstructive pulmonary disease) ([place])   . Depression   . Essential hypertension 02/08/2020  . GERD (gastroesophageal reflux disease)   . Hyperlipidemia   . Hyperparathyroidism   . Polycystic kidney   . PONV (postoperative nausea and vomiting)     Past Surgical History:  Procedure  Laterality Date  . 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 BILATERAL OOPHORECTOMY    . BIOPSY  05/19/2020   Procedure: BIOPSY;  Surgeon: Carol Ada, MD;  Location: [facility];  Service: Endoscopy;;  . CARDIOVERSION N/A 12/25/2020   Procedure: CARDIOVERSION;  Surgeon: Geralynn Rile, MD;  Location: [facility];  Service: Cardiovascular;  Laterality: N/A;  . CARPAL TUNNEL RELEASE  2000  . CESAREAN SECTION    . COLONOSCOPY WITH PROPOFOL N/A 05/19/2020   Procedure: COLONOSCOPY WITH PROPOFOL;  Surgeon: Carol Ada, MD;  Location: [place];  Service: Endoscopy;  Laterality: N/A;  . ENTEROSCOPY N/A 05/19/2020   Procedure: ENTEROSCOPY;  Surgeon: Carol Ada, MD;  Location: [facility];  Service: Endoscopy;  Laterality: N/A;  . INSERTION OF DIALYSIS CATHETER  03/31/2012   Procedure: INSERTION OF DIALYSIS CATHETER;  Surgeon: Angelia Mould, MD;  Location: [facility];  Service: Vascular;  Laterality: N/A;  insertion of dialysis catheter right internal jugular vein  . 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 LAPAROTOMY N/A 12/29/2020   Procedure: EXPLORATORY LAPAROTOMY, SIGMOID COLECTOMY AND COLOSTOMY;  Surgeon: Coralie Keens, MD;  Location[facility];  Service: General;  Laterality: N/A;  Packed Dressing  . ORIF TIBIA FRACTURE Left 12/23/2018   Procedure: OPEN REDUCTION INTERNAL FIXATION (ORIF) TIBIA FRACTURE;  Surgeon: Shona Needles, MD;  Location: [facility];  Service: Orthopedics;  Laterality: Left;  . ORIF TIBIA PLATEAU Right 12/23/2018   Procedure: OPEN REDUCTION INTERNAL FIXATION (ORIF) TIBIAL PLATEAU;  Surgeon: Shona Needles, MD;  Location: [place];  Service: Orthopedics;  Laterality: Right;  . ORIF WRIST FRACTURE Left 12/08/2019   Procedure: OPEN REDUCTION INTERNAL FIXATION (ORIF) WRIST FRACTURE, REPAIR LACERATION LEFT FOREARM;  Surgeon: Dorna Leitz, MD;  Location[facility];  Service: Orthopedics;  Laterality: Left;  . TEE WITHOUT CARDIOVERSION N/A 12/25/2020    Procedure: TRANSESOPHAGEAL ECHOCARDIOGRAM (TEE);  Surgeon: Geralynn Rile, MD;  Location: [facility];  Service: Cardiovascular;  Laterality: N/A;  . [place]  . TOTAL HIP ARTHROPLASTY Left 12/08/2019   Procedure: TOTAL HIP ARTHROPLASTY ANTERIOR APPROACH;  Surgeon: Dorna Leitz, MD;  Location[facility];  Service: Orthopedics;  Laterality: Left;  . TUBAL LIGATION  2010    Social History:  reports that she quit smoking about 22 years ago. Her smoking use included cigarettes. She has a 3.75 pack-year smoking history. She has never used smokeless tobacco. She reports that she does not drink alcohol and does not use drugs.  Allergies:  Allergies  Allergen Reactions  . Infed [Iron Dextran] Other (See Comments)    Chest tightness  . Pentamidine Itching, Shortness Of Breath and Swelling  . Budesonide-Formoterol Fumarate     Other reaction(s): hoarseness  . Bupropion     Other reaction(s): exacerbated depression  . Crestor [Rosuvastatin]     Other reaction(s): body aches, swelling  . Duloxetine Hcl     Other reaction(s): GI SE, weepy, worsening fatigue, foggy  . Erythromycin [Erythromycin] Other (See Comments)    Mouth Ulcers  . Iohexol Other (See Comments)    Per patient "she  has had a kidney transplant and should never have contrast"  . Oxycodone Nausea Only and Nausea And Vomiting  . Pravastatin Sodium     Other reaction(s): myalgias  . Erythromycin Rash    Causes breakout in mouth  . Ultram [Tramadol Hcl] Anxiety    Prior to Admission medications   Medication Sig Start Date End Date Taking? Authorizing Provider  acetaminophen (TYLENOL) 325 MG tablet Take 2 tablets (650 mg total) by mouth every 6 (six) hours as needed for mild pain (or Fever >/= 101). 05/22/20  Yes Elgergawy, Silver Huguenin, MD  allopurinol (ZYLOPRIM) 100 MG tablet Take 100 mg by mouth daily. 11/29/20  Yes [provider]  amLODipine (NORVASC) 5 MG tablet Take 1 tablet (5 mg total) by mouth daily.  01/15/21  Yes Mercy Riding, MD  apixaban (ELIQUIS) 5 MG TABS tablet Take 1 tablet (5 mg total) by mouth 2 (two) times daily. 01/14/21  Yes Mercy Riding, MD  brimonidine (ALPHAGAN P) 0.1 % SOLN Place 1 drop into both eyes 2 (two) times daily.    Yes [provider]  Budeson-Glycopyrrol-Formoterol (BREZTRI AEROSPHERE) 160-9-4.8 MCG/ACT AERO Inhale 2 puffs into the lungs 2 (two) times daily. 08/20/20  Yes Martyn Ehrich, NP  cholecalciferol (VITAMIN D3) 25 MCG (1000 UNIT) tablet Take 1 tablet (1,000 Units total) by mouth daily. 01/04/20  Yes Angiulli, Lavon Paganini, PA-C  colchicine 0.6 MG tablet Take 0.5 tablets (0.3 mg total) by mouth daily. 01/15/21  Yes Mercy Riding, MD  cyanocobalamin 1000 MCG tablet Take 1,000 mcg by mouth daily.   Yes [provider]  cycloSPORINE (RESTASIS) 0.05 % ophthalmic emulsion Place 1 drop into both eyes 2 (two) times daily. 01/04/20  Yes Angiulli, Lavon Paganini, PA-C  dorzolamide (TRUSOPT) 2 % ophthalmic solution Place 1 drop into both eyes daily. 06/25/19  Yes [provider]  ferrous sulfate 325 (65 FE) MG tablet Take 325 mg by mouth daily with breakfast.   Yes [provider]  FLUoxetine (PROZAC) 40 MG capsule Take 1 capsule (40 mg total) by mouth daily. 01/04/20  Yes Angiulli, Lavon Paganini, PA-C  furosemide (LASIX) 40 MG tablet Take 1 tablet (40 mg total) by mouth 2 (two) times daily. 01/14/21  Yes Mercy Riding, MD  gabapentin (NEURONTIN) 100 MG capsule Take 200 mg by mouth 2 (two) times daily.   Yes [provider]  Glucerna (GLUCERNA) LIQD Take 237 mLs by mouth 2 (two) times daily between meals.   Yes [provider]  guaiFENesin (MUCINEX) 600 MG 12 hr tablet Take 600 mg by mouth 2 (two) times daily.   Yes [provider]  HYDROcodone-acetaminophen (NORCO/VICODIN) 5-325 MG tablet Take 1 tablet by mouth every 12 (twelve) hours as needed for moderate pain.   Yes [provider]  insulin aspart (NOVOLOG) 100  UNIT/ML injection Inject 0-9 Units into the skin 3 (three) times daily with meals. CBG 70 - 120: 0 units CBG 121 - 150: 1 unit CBG 151 - 200: 2 units CBG 201 - 250: 3 units CBG 251 - 300: 5 units CBG 301 - 350: 7 units CBG 351 - 400: 9 units 01/14/21  Yes Gonfa, Taye T, MD  insulin glargine (LANTUS) 100 UNIT/ML injection Inject 0.05 mLs (5 Units total) into the skin 2 (two) times daily. 01/14/21  Yes Mercy Riding, MD  metoprolol tartrate (LOPRESSOR) 25 MG tablet Take 1 tablet (25 mg total) by mouth 2 (two) times daily. 01/14/21  Yes Gonfa,  Charlesetta Ivory, MD  mycophenolate (MYFORTIC) 180 MG EC tablet Take 180 mg by mouth 2 (two) times daily.   Yes [provider]  Nutritional Supplements (,FEEDING SUPPLEMENT, PROSOURCE PLUS) liquid Take 30 mLs by mouth 2 (two) times daily between meals. 01/14/21  Yes Mercy Riding, MD  ondansetron (ZOFRAN) 4 MG tablet Take 1 tablet (4 mg total) by mouth every 8 (eight) hours as needed for nausea or vomiting. 01/14/21 01/14/22 Yes Mercy Riding, MD  pantoprazole (PROTONIX) 40 MG tablet Take 1 tablet (40 mg total) by mouth daily. 01/15/21  Yes Mercy Riding, MD  predniSONE (DELTASONE) 2.5 MG tablet Take 3 tablets (7.5 mg total) by mouth daily with breakfast. 01/15/21  Yes Gonfa, Taye T, MD  PROGRAF 1 MG capsule Take 1 mg by mouth 2 (two) times daily.  03/22/19  Yes [provider]  sodium hypochlorite (DAKIN'S 1/4 STRENGTH) 0.125 % SOLN Irrigate with as directed 2 (two) times daily. 01/14/21  Yes Gonfa, Charlesetta Ivory, MD  TRAVATAN Z 0.004 % SOLN ophthalmic solution Place 1 drop into both eyes at bedtime. 07/03/16  Yes [provider]  valACYclovir (VALTREX) 500 MG tablet Take 500 mg by mouth 2 (two) times daily.   Yes [provider]  albuterol (VENTOLIN HFA) 108 (90 Base) MCG/ACT inhaler Inhale 2 puffs into the lungs every 4 (four) hours as needed for wheezing or shortness of breath. 01/04/20   Angiulli, Lavon Paganini, PA-C  ipratropium-albuterol (DUONEB)  0.5-2.5 (3) MG/3ML SOLN Take 3 mLs by nebulization every 6 (six) hours as needed (sob/wheezing).  08/24/20   [provider]  promethazine (PHENERGAN) 25 MG tablet Take 1 tablet (25 mg total) by mouth every 8 (eight) hours as needed for up to 15 doses for nausea or vomiting. 09/02/20   Curatolo, Adam, DO     Blood pressure (!) 86/55, pulse 61, temperature 99.2 F (37.3 C), temperature source Rectal, resp. rate (!) 22, SpO2 90 %. Physical Exam:  General: ill, confused, hypotensive, not really able to answer questions HEENT: head is normocephalic, atraumatic.  Sclera are noninjected.  Pupils are equal.  Ears and nose without any masses or lesions.  Mouth is pink and moist Heart: tachycardic in SR on telemetry  Normal s1,s2. No obvious murmurs, gallops, or rubs noted.  Palpable radial and pedal pulses bilaterally.  +2-3 edema both lower legs, generalized anasarca. Lungs: CTAB, no wheezes, rhonchi, or rales noted.  Respiratory effort labored on [place] and FM Abd: soft, she denies pain.  Colostomy looks OK, with stool/gas in the ostomy bag.  Mid line wound shows necrotic tissue at the base, when I did the dressing change, she had just a trace of green on the dressing.   MS: all 4 extremities are symmetrical with no cyanosis, or clubbing. +2-3 edema both lower legs. Edema upper extremities.  AVR LUE with good bruit. Skin: warm and dry with no masses, lesions, or rashes Neuro: Cranial nerves 2-12 grossly intact, sensation is normal throughout Ps[place]:   Dressing from SNF  Dressing when I saw her and looked at the wound   Results for orders placed or performed during the hospital encounter of 01/19/2021 (from the past 48 hour(s))  Comprehensive metabolic panel     Status: Abnormal   Collection Time: 01/09/2021  1:39 PM  Result Value Ref Range   Sodium 126 (L) 135 - 145 mmol/L   Potassium 4.7 3.5 - 5.1 mmol/L   Chloride 96 (L) 98 - 111 mmol/L  CO2 20 (L) 22 - 32 mmol/L   Glucose, Bld 75 70 - 99  mg/dL    Comment: Glucose reference range applies only to samples taken after fasting for at least 8 hours.   BUN 55 (H) 8 - 23 mg/dL   Creatinine, Ser 2.19 (H) 0.44 - 1.00 mg/dL   Calcium 8.4 (L) 8.9 - 10.3 mg/dL   Total Protein 6.0 (L) 6.5 - 8.1 g/dL   Albumin 1.9 (L) 3.5 - 5.0 g/dL   AST 25 15 - 41 U/L   ALT 8 0 - 44 U/L   Alkaline Phosphatase 94 38 - 126 U/L   Total Bilirubin 1.2 0.3 - 1.2 mg/dL   GFR, Estimated 25 (L) >60 mL/min    Comment: (NOTE) Calculated using the CKD-EPI Creatinine Equation (2021)    Anion gap 10 5 - 15    Comment: Performed at [facility], [place] [address], [place], [place] [address]  Lactic acid, plasma     Status: None   Collection Time: 12/27/2020  1:39 PM  Result Value Ref Range   Lactic Acid, Venous 0.8 0.5 - 1.9 mmol/L    Comment: Performed at [facility], [place] [address], [place], [place] [address]  CBC with Differential     Status: Abnormal (Preliminary result)   Collection Time: 01/12/2021  1:39 PM  Result Value Ref Range   WBC 6.8 4.0 - 10.5 K/uL   RBC 2.75 (L) 3.87 - 5.11 MIL/uL   Hemoglobin 8.5 (L) 12.0 - 15.0 g/dL   HCT 27.1 (L) 36.0 - 46.0 %   MCV 98.5 80.0 - 100.0 fL   MCH 30.9 26.0 - 34.0 pg   MCHC 31.4 30.0 - 36.0 g/dL   RDW 20.0 (H) 11.5 - 15.5 %   Platelets 199 150 - 400 K/uL   nRBC 0.0 0.0 - 0.2 %    Comment: Performed at [facility], [place] [address], [place], [place] [address]   Neutrophils Relative % PENDING %   Neutro Abs PENDING 1.7 - 7.7 K/uL   Band Neutrophils PENDING %   Lymphocytes Relative PENDING %   Lymphs Abs PENDING 0.7 - 4.0 K/uL   Monocytes Relative PENDING %   Monocytes Absolute PENDING 0.1 - 1.0 K/uL   Eosinophils Relative PENDING %   Eosinophils Absolute PENDING 0.0 - 0.5 K/uL   Basophils Relative PENDING %   Basophils Absolute PENDING 0.0 - 0.1 K/uL   WBC Morphology PENDING    RBC Morphology PENDING    Smear Review PENDING    Other PENDING %   nRBC  PENDING 0 /100 WBC   Metamyelocytes Relative PENDING %   Myelocytes PENDING %   Promyelocytes Relative PENDING %   Blasts PENDING %   Immature Granulocytes PENDING %   Abs Immature Granulocytes PENDING 0.00 - 0.07 K/uL  Protime-INR     Status: Abnormal   Collection Time: 01/02/2021  1:39 PM  Result Value Ref Range   Prothrombin Time 28.9 (H) 11.4 - 15.2 seconds   INR 2.8 (H) 0.8 - 1.2    Comment: (NOTE) INR goal varies based on device and disease states. Performed at [facility], [place] [address] [place], [place] [address]   Lipase, blood     Status: None   Collection Time: 01/01/2021  1:43 PM  Result Value Ref Range   Lipase 16 11 - 51 U/L    Comment: Performed at [facility], [place]  [address] [place], [place] [address]  Brain natriuretic peptide     Status: Abnormal   Collection Time: 01/18/2021  1:45 PM  Result Value Ref Range   B Natriuretic Peptide 188.6 (H) 0.0 - 100.0 pg/mL    Comment: Performed at [facility], [place] [address], [place], [place] [address]  Resp Panel by RT-PCR (Flu A&B, Covid) Nasopharyngeal Swab     Status: None   Collection Time: 01/14/2021  2:05 PM   Specimen: Nasopharyngeal Swab; Nasopharyngeal(NP) swabs in vial transport medium  Result Value Ref Range   SARS Coronavirus 2 by RT PCR NEGATIVE NEGATIVE    Comment: (NOTE) SARS-CoV-2 target nucleic acids are NOT DETECTED.  The SARS-CoV-2 RNA is generally detectable in upper respiratory specimens during the acute phase of infection. The lowest concentration of SARS-CoV-2 viral copies this assay can detect is 138 copies/mL. A negative result does not preclude SARS-Cov-2 infection and should not be used as the sole basis for treatment or other patient management decisions. A negative result may occur with  improper specimen collection/handling, submission of specimen other than nasopharyngeal swab, presence of viral mutation(s) within the areas targeted by  this assay, and inadequate number of viral copies(<138 copies/mL). A negative result must be combined with clinical observations, patient history, and epidemiological information. The expected result is Negative.  Fact Sheet for Patients:  EntrepreneurPulse.com.au  Fact Sheet for Healthcare Providers:  IncredibleEmployment.be  This test is no t yet approved or cleared by the [place] FDA and  has been authorized for detection and/or diagnosis of SARS-CoV-2 by FDA under an Emergency Use Authorization (EUA). This EUA will remain  in effect (meaning this test can be used) for the duration of the COVID-19 declaration under Section 564(b)(1) of the Act, 21 U.S.C.section 360bbb-3(b)(1), unless the authorization is terminated  or revoked sooner.       Influenza A by PCR NEGATIVE NEGATIVE   Influenza B by PCR NEGATIVE NEGATIVE    Comment: (NOTE) The Xpert Xpress SARS-CoV-2/FLU/RSV plus assay is intended as an aid in the diagnosis of influenza from Nasopharyngeal swab specimens and should not be used as a sole basis for treatment. Nasal washings and aspirates are unacceptable for Xpert Xpress SARS-CoV-2/FLU/RSV testing.  Fact Sheet for Patients: EntrepreneurPulse.com.au  Fact Sheet for Healthcare Providers: IncredibleEmployment.be  This test is not yet approved or cleared by the [place] FDA and has been authorized for detection and/or diagnosis of SARS-CoV-2 by FDA under an Emergency Use Authorization (EUA). This EUA will remain in effect (meaning this test can be used) for the duration of the COVID-19 declaration under Section 564(b)(1) of the Act, 21 U.S.C. section 360bbb-3(b)(1), unless the authorization is terminated or revoked.  Performed at [facility], [place] [address] [place], [place] [address]   Lactic acid, plasma     Status: None   Collection Time: 12/24/2020  3:37 PM   Result Value Ref Range   Lactic Acid, Venous 0.9 0.5 - 1.9 mmol/L    Comment: Performed at [facility], [place] [address], [place], [place] [address]   CT ABDOMEN PELVIS WO CONTRAST  Result Date: 01/05/2021 CLINICAL DATA:  Respiratory failure, history of kidney transplant, recent abdominal surgery with drainage EXAM: CT CHEST, ABDOMEN AND PELVIS WITHOUT CONTRAST TECHNIQUE: Multidetector CT imaging of the chest, abdomen and pelvis was performed following the standard protocol without IV contrast. Unenhanced CT was performed per clinician order. Lack of IV contrast limits sensitivity and specificity, especially for evaluation of abdominal/pelvic  solid viscera. COMPARISON:  05/14/2020, 12/29/2020 FINDINGS: CT CHEST FINDINGS Cardiovascular: Unenhanced imaging of the heart and great vessels demonstrates no pericardial effusion. Normal caliber of the thoracic aorta. Minimal atherosclerosis. Incidental note is made of prominent vascular structures in the left upper extremity consistent with previous AV fistula compatible with history of renal failure. Mediastinum/Nodes: No enlarged mediastinal, hilar, or axillary lymph nodes. Thyroid gland, trachea, and esophagus demonstrate no significant findings. Lungs/Pleura: There are small bilateral pleural effusions, right greater than left. Bilateral lower lobe atelectasis greatest in the right lower lobe. Patchy lingular consolidation may reflect hypoventilatory change versus early infection. No pneumothorax. Central airways are patent. Musculoskeletal: Prior healed bilateral rib fractures. No acute or destructive bony lesions. Reconstructed images demonstrate no additional findings. CT ABDOMEN PELVIS FINDINGS Hepatobiliary: Multiple hepatic cysts are again noted. Otherwise unremarkable unenhanced evaluation of the liver. Small gallstones are identified within the gallbladder with no evidence of acute cholecystitis. Pancreas: Unremarkable. No pancreatic  ductal dilatation or surrounding inflammatory changes. Spleen: Mild splenomegaly unchanged.  No focal abnormalities. Adrenals/Urinary Tract: Numerous cysts are seen throughout the native kidneys, unchanged. Right lower quadrant renal transplant is unremarkable. No urinary tract calculi or obstructive uropathy. Bladder is unremarkable without filling defect. The adrenals are normal. Stomach/Bowel: No bowel obstruction or ileus. Diverting colostomy left lower quadrant is noted. Minimal diverticulosis of the remaining sigmoid colon. There is a fluid collection in the left lower quadrant measuring 3.3 x 2.8 cm on image 92/7, and extending approximately 5.3 cm in craniocaudal direction image 127/4. Fluid collection abuts the enterotomy staple line in the left lower quadrant, and could reflect postoperative seroma or abscess. Evaluation is limited without IV and oral contrast. No bowel wall thickening.  Normal appendix right lower quadrant. Vascular/Lymphatic: Aortic atherosclerosis. No enlarged abdominal or pelvic lymph nodes. Reproductive: Uterus and bilateral adnexa are unremarkable. Other: Postsurgical changes are seen from midline laparotomy, with residual surgical defect and packing. No subcutaneous fluid collection. There is a left inguinal hernia containing a small amount of fluid, though decreased since prior study. No evidence of bowel herniation. Aside from the left lower quadrant fluid collection described above, no additional areas of intra-abdominal fluid or free gas. Musculoskeletal: Postsurgical changes from left hip arthroplasty. There are no acute or destructive bony lesions. Reconstructed images demonstrate no additional findings. IMPRESSION: 1. Fluid collection left lower quadrant abutting the staple line of the sigmoid colon. Findings could reflect postoperative seroma or abscess. Evaluation limited without IV and oral contrast. 2. Small bilateral pleural effusions and bilateral lower lobe  atelectasis, right greater than left. 3. Patchy non dependent consolidation within the lingula, which could reflect hypoventilatory change or early infection. 4. Sequela of end-stage renal disease, with unremarkable transplant kidney right lower quadrant. 5. Postsurgical changes from midline laparotomy and left lower quadrant colostomy. Surgical defect from the midline laparotomy without fluid collection or abdominal wall abscess. 6. Stable splenomegaly. 7.  Aortic Atherosclerosis (ICD10-I70.0). Electronically Signed   By: Randa Ngo M.D.   On: 01/07/2021 15:14   CT Chest Wo Contrast  Result Date: 01/12/2021 CLINICAL DATA:  Respiratory failure, history of kidney transplant, recent abdominal surgery with drainage EXAM: CT CHEST, ABDOMEN AND PELVIS WITHOUT CONTRAST TECHNIQUE: Multidetector CT imaging of the chest, abdomen and pelvis was performed following the standard protocol without IV contrast. Unenhanced CT was performed per clinician order. Lack of IV contrast limits sensitivity and specificity, especially for evaluation of abdominal/pelvic solid viscera. COMPARISON:  05/14/2020, 12/29/2020 FINDINGS: CT CHEST FINDINGS Cardiovascular: Unenhanced imaging of the  heart and great vessels demonstrates no pericardial effusion. Normal caliber of the thoracic aorta. Minimal atherosclerosis. Incidental note is made of prominent vascular structures in the left upper extremity consistent with previous AV fistula compatible with history of renal failure. Mediastinum/Nodes: No enlarged mediastinal, hilar, or axillary lymph nodes. Thyroid gland, trachea, and esophagus demonstrate no significant findings. Lungs/Pleura: There are small bilateral pleural effusions, right greater than left. Bilateral lower lobe atelectasis greatest in the right lower lobe. Patchy lingular consolidation may reflect hypoventilatory change versus early infection. No pneumothorax. Central airways are patent. Musculoskeletal: Prior healed  bilateral rib fractures. No acute or destructive bony lesions. Reconstructed images demonstrate no additional findings. CT ABDOMEN PELVIS FINDINGS Hepatobiliary: Multiple hepatic cysts are again noted. Otherwise unremarkable unenhanced evaluation of the liver. Small gallstones are identified within the gallbladder with no evidence of acute cholecystitis. Pancreas: Unremarkable. No pancreatic ductal dilatation or surrounding inflammatory changes. Spleen: Mild splenomegaly unchanged.  No focal abnormalities. Adrenals/Urinary Tract: Numerous cysts are seen throughout the native kidneys, unchanged. Right lower quadrant renal transplant is unremarkable. No urinary tract calculi or obstructive uropathy. Bladder is unremarkable without filling defect. The adrenals are normal. Stomach/Bowel: No bowel obstruction or ileus. Diverting colostomy left lower quadrant is noted. Minimal diverticulosis of the remaining sigmoid colon. There is a fluid collection in the left lower quadrant measuring 3.3 x 2.8 cm on image 92/7, and extending approximately 5.3 cm in craniocaudal direction image 127/4. Fluid collection abuts the enterotomy staple line in the left lower quadrant, and could reflect postoperative seroma or abscess. Evaluation is limited without IV and oral contrast. No bowel wall thickening.  Normal appendix right lower quadrant. Vascular/Lymphatic: Aortic atherosclerosis. No enlarged abdominal or pelvic lymph nodes. Reproductive: Uterus and bilateral adnexa are unremarkable. Other: Postsurgical changes are seen from midline laparotomy, with residual surgical defect and packing. No subcutaneous fluid collection. There is a left inguinal hernia containing a small amount of fluid, though decreased since prior study. No evidence of bowel herniation. Aside from the left lower quadrant fluid collection described above, no additional areas of intra-abdominal fluid or free gas. Musculoskeletal: Postsurgical changes from left hip  arthroplasty. There are no acute or destructive bony lesions. Reconstructed images demonstrate no additional findings. IMPRESSION: 1. Fluid collection left lower quadrant abutting the staple line of the sigmoid colon. Findings could reflect postoperative seroma or abscess. Evaluation limited without IV and oral contrast. 2. Small bilateral pleural effusions and bilateral lower lobe atelectasis, right greater than left. 3. Patchy non dependent consolidation within the lingula, which could reflect hypoventilatory change or early infection. 4. Sequela of end-stage renal disease, with unremarkable transplant kidney right lower quadrant. 5. Postsurgical changes from midline laparotomy and left lower quadrant colostomy. Surgical defect from the midline laparotomy without fluid collection or abdominal wall abscess. 6. Stable splenomegaly. 7.  Aortic Atherosclerosis (ICD10-I70.0). Electronically Signed   By: Randa Ngo M.D.   On: 01/12/2021 15:14   DG Chest Portable 1 View  Result Date: 01/15/2021 CLINICAL DATA:  Hypoxia EXAM: PORTABLE CHEST 1 VIEW COMPARISON:  01/01/2021 FINDINGS: Central line is been removed since the prior study. Improved aeration in the bases with residual mild bibasilar airspace disease likely atelectasis. Negative for edema or effusion. IMPRESSION: Mild bibasilar atelectasis Electronically Signed   By: Franchot Gallo M.D.   On: 01/01/2021 14:04   . sodium chloride    . albumin human    . vancomycin     Anti-infectives (From admission, onward)   Start     Dose/Rate  Route Frequency Ordered Stop   12/24/2020 1545  ceFEPIme (MAXIPIME) 2 g in sodium chloride 0.9 % 100 mL IVPB        2 g 200 mL/hr over 30 Minutes Intravenous  Once 01/14/2021 1544 01/01/2021 1625   12/31/2020 1545  metroNIDAZOLE (FLAGYL) IVPB 500 mg        500 mg 100 mL/hr over 60 Minutes Intravenous  Once 12/31/2020 1544 01/19/2021 1730   01/19/2021 1545  vancomycin (VANCOCIN) IVPB 1000 mg/200 mL premix        1,000 mg 200 mL/hr  over 60 Minutes Intravenous  Once 01/06/2021 1544          Assessment/Plan Polycystic disease/ESRD with renal transplant 2013-Prograf/prednisone. Elevated Cr - 2.19 Hx CHF Chronic COPD/history of tobacco use- On 2L PRN at home Hx Age indeterminate, non occlusive deep vein thrombosis involving the right popliteal vein Hx SVT/Atrial flutter with RVR/TEE/DC cardioversion 12/25/20 Large hematoma left arm - drained and dressed- cleaning up hematoma still present Anemia - H/H 8.5/27.1 Severe protein calorie malnutrition-Total protein 6.0/albumin 1.9 Hyponatremia Hypercoagulable - INR 2.8 (on Eliquis) Severe malnutrition   Sepsis and 3.2 x 2.8 x 5.3 cm abdominal fluid/abscess collection adjacent to enterotomy staple line Hx Perforated sigmoid diverticulitis -S/pExploratory laparotomy with sigmoid colectomy/end colostomy, 12/29/2020 Dr. Coralie Keens Non healing midline abdominal wound    GHW:EXHBZJ, supplements per RD ID: Cefepime, Flagyl, Vancomycin 2/28>> DVT: INR 2.8   Plan:  Will review with Dr. Redmond Pulling who is on tonight.  She is going to the ICU, and is extremely ill.  She will need resuscitation tonight, I will defer reversing anticoagulation to Medicine.  Will most likely need a drain if window is available.  With no contrast she may need another CT before IR can make a determination on this.    Earnstine Regal [facility] 12/25/2020, 4:58 PM Please see Amion for pager number during day hours 7:00am-4:30pm

## 2021-01-20 NOTE — Progress Notes (Signed)
Pharmacy Antibiotic Note  April [name] is a 65 y.o. female admitted on 12/25/2020 with wound infection.  Pharmacy has been consulted for vanc/cefepime dosing.  Plan:  Vancomycin 1g IV q48 based on current SCr and est CrCl of 24 ml/min - goal AUC 400-550  Cefepime 2g IV q24 per current renal function    Temp (24hrs), Avg:99 F (37.2 C), Min:98.6 F (37 C), Max:99.2 F (37.3 C)  Recent Labs  Lab 01/14/21 0520 01/06/2021 1339 01/07/2021 1537  WBC 3.2* 6.8  --   CREATININE  --  2.19*  --   LATICACIDVEN  --  0.8 0.9    Estimated Creatinine Clearance: 23.9 mL/min (A) (by C-G formula based on SCr of 2.19 mg/dL (H)).    Allergies  Allergen Reactions  . Infed [Iron Dextran] Other (See Comments)    Chest tightness  . Pentamidine Itching, Shortness Of Breath and Swelling  . Budesonide-Formoterol Fumarate     Other reaction(s): hoarseness  . Bupropion     Other reaction(s): exacerbated depression  . Crestor [Rosuvastatin]     Other reaction(s): body aches, swelling  . Duloxetine Hcl     Other reaction(s): GI SE, weepy, worsening fatigue, foggy  . Erythromycin [Erythromycin] Other (See Comments)    Mouth Ulcers  . Iohexol Other (See Comments)    Per patient "she has had a kidney transplant and should never have contrast"  . Oxycodone Nausea Only and Nausea And Vomiting  . Pravastatin Sodium     Other reaction(s): myalgias  . Erythromycin Rash    Causes breakout in mouth  . Ultram [Tramadol Hcl] Anxiety    Thank you for allowing pharmacy to be a part of this patient's care.  Kara Mead 01/01/2021 5:37 PM

## 2021-01-20 NOTE — Progress Notes (Signed)
[facility] for IV heparin Indication: atrial fibrillation  Allergies  Allergen Reactions  . Infed [Iron Dextran] Other (See Comments)    Chest tightness  . Pentamidine Itching, Shortness Of Breath and Swelling  . Budesonide-Formoterol Fumarate     Other reaction(s): hoarseness  . Bupropion     Other reaction(s): exacerbated depression  . Crestor [Rosuvastatin]     Other reaction(s): body aches, swelling  . Duloxetine Hcl     Other reaction(s): GI SE, weepy, worsening fatigue, foggy  . Erythromycin [Erythromycin] Other (See Comments)    Mouth Ulcers  . Iohexol Other (See Comments)    Per patient "she has had a kidney transplant and should never have contrast"  . Oxycodone Nausea Only and Nausea And Vomiting  . Pravastatin Sodium     Other reaction(s): myalgias  . Erythromycin Rash    Causes breakout in mouth  . Ultram [Tramadol Hcl] Anxiety    Patient Measurements:   Heparin Dosing Weight: 64 kg  Vital Signs: Temp: 99.2 F (37.3 C) (02/28 1355) Temp Source: Rectal (02/28 1355) BP: 79/67 (02/28 1715) Pulse Rate: 75 (02/28 1715)  Labs: Recent Labs    12/31/2020 1339  HGB 8.5*  HCT 27.1*  PLT 199  LABPROT 28.9*  INR 2.8*  CREATININE 2.19*    Estimated Creatinine Clearance: 23.9 mL/min (A) (by C-G formula based on SCr of 2.19 mg/dL (H)).   Medical History: Past Medical History:  Diagnosis Date  . Arthritis   . Asthma   . CHF (congestive heart failure) ([place])   . COPD (chronic obstructive pulmonary disease) ([place])   . Depression   . Essential hypertension 02/08/2020  . GERD (gastroesophageal reflux disease)   . Hyperlipidemia   . Hyperparathyroidism   . Polycystic kidney   . PONV (postoperative nausea and vomiting)     Medications:  (Not in a hospital admission)  Scheduled:  . hydrocortisone sod succinate (SOLU-CORTEF) inj  50 mg Intravenous Q8H   Infusions:  . sodium chloride    . albumin human    . [START ON  01/21/2021] ceFEPime (MAXIPIME) IV    . heparin    . vancomycin      Assessment: 41 yoF with PMH PAF s/p DCCV (12/25/20) on Eliquis, chronic resp fail (2L O2 baseline), CKD3 s/p renal txplant, dCHF, DM2, admitted after being found unresponsive at [facility]. Concern for infection after recent abdominal surgery; Pharmacy consulted to convert Eliquis to IV heparin while possible procedures pending. During last admission, heparin was especially difficult to control, with rates ranging from 1650 units/hr down to 250 units/hr. Patient also noted to have significant bruising and hematomas on several extremities last admission. However, Cardiology has recommended at least 30 days of anticoagulation after DCCV (through 3/4).   Baseline INR elevated d/t Eliquis; aPTT not done  Prior anticoagulation: Eliquis 5 mg bid, LD 2/28 at 0900  Significant events:  Today, 01/13/2021:  CBC: hgb low but consistent with prior levels; Plt stable WNL  No bleeding or infusion issues per nursing  Goal of Therapy: Heparin level 0.3-0.7 units/ml Monitor platelets by anticoagulation protocol: Yes  Plan:  Heparin 750 units/hr IV infusion - very difficult to predict patient's heparin requirements given prior experience, and new AKI noted on admission. Will start infusion on the lower end and plan to adjust aggressively if out of range  Check aPTT 8 hrs after start  Daily CBC and daily heparin level, aPTTs as needed while DOAC effects persist  Monitor for  signs of bleeding or thrombosis  Reuel Boom, PharmD, BCPS [phone] 01/01/2021, 5:53 PM

## 2021-01-21 ENCOUNTER — Encounter (HOSPITAL_COMMUNITY): Payer: Self-pay | Admitting: Internal Medicine

## 2021-01-21 DIAGNOSIS — Z515 Encounter for palliative care: Secondary | ICD-10-CM

## 2021-01-21 DIAGNOSIS — G9341 Metabolic encephalopathy: Secondary | ICD-10-CM | POA: Diagnosis not present

## 2021-01-21 DIAGNOSIS — I959 Hypotension, unspecified: Secondary | ICD-10-CM | POA: Diagnosis not present

## 2021-01-21 DIAGNOSIS — R109 Unspecified abdominal pain: Secondary | ICD-10-CM

## 2021-01-21 DIAGNOSIS — Z7189 Other specified counseling: Secondary | ICD-10-CM

## 2021-01-21 DIAGNOSIS — I5033 Acute on chronic diastolic (congestive) heart failure: Secondary | ICD-10-CM | POA: Diagnosis not present

## 2021-01-21 LAB — PROTIME-INR
INR: 3.1 — ABNORMAL HIGH (ref 0.8–1.2)
Prothrombin Time: 30.8 seconds — ABNORMAL HIGH (ref 11.4–15.2)

## 2021-01-21 LAB — MAGNESIUM: Magnesium: 1.8 mg/dL (ref 1.7–2.4)

## 2021-01-21 LAB — GLUCOSE, CAPILLARY
Glucose-Capillary: 125 mg/dL — ABNORMAL HIGH (ref 70–99)
Glucose-Capillary: 116 mg/dL — ABNORMAL HIGH (ref 70–99)
Glucose-Capillary: 109 mg/dL — ABNORMAL HIGH (ref 70–99)
Glucose-Capillary: 155 mg/dL — ABNORMAL HIGH (ref 70–99)

## 2021-01-21 LAB — CBC
HCT: 24.1 % — ABNORMAL LOW (ref 36.0–46.0)
Hemoglobin: 7.3 g/dL — ABNORMAL LOW (ref 12.0–15.0)
MCH: 31.3 pg (ref 26.0–34.0)
MCHC: 30.3 g/dL (ref 30.0–36.0)
MCV: 103.4 fL — ABNORMAL HIGH (ref 80.0–100.0)
Platelets: 141 10*3/uL — ABNORMAL LOW (ref 150–400)
RBC: 2.33 MIL/uL — ABNORMAL LOW (ref 3.87–5.11)
RDW: 19.5 % — ABNORMAL HIGH (ref 11.5–15.5)
WBC: 3.5 10*3/uL — ABNORMAL LOW (ref 4.0–10.5)
nRBC: 0 % (ref 0.0–0.2)

## 2021-01-21 LAB — BASIC METABOLIC PANEL
Anion gap: 10 (ref 5–15)
BUN: 51 mg/dL — ABNORMAL HIGH (ref 8–23)
CO2: 18 mmol/L — ABNORMAL LOW (ref 22–32)
Calcium: 8.2 mg/dL — ABNORMAL LOW (ref 8.9–10.3)
Chloride: 101 mmol/L (ref 98–111)
Creatinine, Ser: 2.09 mg/dL — ABNORMAL HIGH (ref 0.44–1.00)
GFR, Estimated: 26 mL/min — ABNORMAL LOW (ref >60)
Glucose, Bld: 121 mg/dL — ABNORMAL HIGH (ref 70–99)
Potassium: 5 mmol/L (ref 3.5–5.1)
Sodium: 129 mmol/L — ABNORMAL LOW (ref 135–145)

## 2021-01-21 LAB — APTT
aPTT: 133 seconds — ABNORMAL HIGH (ref 24–36)
aPTT: 87 seconds — ABNORMAL HIGH (ref 24–36)

## 2021-01-21 LAB — PHOSPHORUS: Phosphorus: 4.1 mg/dL (ref 2.5–4.6)

## 2021-01-21 LAB — PREALBUMIN: Prealbumin: <5 mg/dL — ABNORMAL LOW (ref 18–38)

## 2021-01-21 LAB — C DIFFICILE QUICK SCREEN W PCR REFLEX
C Diff antigen: POSITIVE — AB
C Diff interpretation: DETECTED
C Diff toxin: POSITIVE — AB

## 2021-01-21 LAB — D-DIMER, QUANTITATIVE: D-Dimer, Quant: 0.73 ug/mL-FEU — ABNORMAL HIGH (ref 0.00–0.50)

## 2021-01-21 LAB — FIBRINOGEN: Fibrinogen: 525 mg/dL — ABNORMAL HIGH (ref 210–475)

## 2021-01-21 LAB — HEPARIN LEVEL (UNFRACTIONATED): Heparin Unfractionated: >2.2 IU/mL — ABNORMAL HIGH (ref 0.30–0.70)

## 2021-01-21 MED ORDER — JUVEN PO PACK
1.0000 | PACK | Freq: Two times a day (BID) | ORAL | Status: DC
  Administered 2021-01-23 – 2021-02-04 (×9): 1 via ORAL
  Filled 2021-01-21 (×20): qty 1

## 2021-01-21 MED ORDER — VANCOMYCIN 50 MG/ML ORAL SOLUTION
125.0000 mg | Freq: Four times a day (QID) | ORAL | Status: AC
  Administered 2021-01-21 – 2021-01-31 (×38): 125 mg via ORAL
  Filled 2021-01-21 (×43): qty 2.5

## 2021-01-21 MED ORDER — HEPARIN (PORCINE) 25000 UT/250ML-% IV SOLN
750.0000 [IU]/h | INTRAVENOUS | Status: AC
  Administered 2021-01-22: 06:00:00 550 [IU]/h via INTRAVENOUS
  Administered 2021-01-23: 700 [IU]/h via INTRAVENOUS
  Filled 2021-01-21 (×2): qty 250

## 2021-01-21 MED ORDER — ENSURE ENLIVE PO LIQD
237.0000 mL | Freq: Two times a day (BID) | ORAL | Status: DC
  Administered 2021-01-22 – 2021-02-04 (×17): 237 mL via ORAL

## 2021-01-21 MED ORDER — ADULT MULTIVITAMIN W/MINERALS CH
1.0000 | ORAL_TABLET | Freq: Every day | ORAL | Status: DC
  Administered 2021-01-21 – 2021-02-04 (×15): 1 via ORAL
  Filled 2021-01-21 (×14): qty 1

## 2021-01-21 MED ORDER — PROSOURCE PLUS PO LIQD
30.0000 mL | Freq: Two times a day (BID) | ORAL | Status: DC
  Administered 2021-01-21 – 2021-01-31 (×14): 30 mL via ORAL
  Filled 2021-01-21 (×16): qty 30

## 2021-01-21 NOTE — Progress Notes (Signed)
Date and time results received: 01/21/21, 20:18  Test: C-diff   Critical Value: Positive   Name of Provider Notified: T.Opyd/[facility]  Ordered Vancomycin PO, started enteric precaution

## 2021-01-21 NOTE — Progress Notes (Signed)
CC:non-responive, hypotensive  Subjective: Pt is much more alert today but on CPAP.  Abdominal wound looks pretty good this AM.  I don't know, nor does the daughter how long it had been since pt had a dressing change at the SNF.  Stool in the ostomy this AM also.    Objective: Vital signs in last 24 hours: Temp:  [97 F (36.1 C)-99.2 F (37.3 C)] 97 F (36.1 C) (03/01 0800) Pulse Rate:  [40-89] 78 (03/01 0800) Resp:  [13-30] 15 (03/01 0800) BP: (79-137)/(40-76) 137/74 (03/01 0800) SpO2:  [74 %-100 %] 100 % (03/01 0800) FiO2 (%):  [100 %] 100 % (02/28 1855) Weight:  [71.4 kg-74.2 kg] 74.2 kg (03/01 0749) Last BM Date: 01/21/21 (per colostomy output) 2667 IV 850 urine 25 stool T-max 99.2, blood pressure is improving up to 139/118 this AM.  Heart rate 77 NA 129, glucose 121, creatinine 2.09, BUN 51, WBC 3.5, H/H 7.3/24.1 Platelets 141,000. INR 3.1 Prealbumin < 5 Intake/Output from previous day: 02/28 0701 - 03/01 0700 In: 2667.8 [I.V.:755.9; IV Piggyback:1911.8] Out: 448 [Urine:850; Stool:25] Intake/Output this shift: No intake/output data recorded.  General appearance: alert, cooperative, mildly obese and on CPAP but much more comfortable.   Resp: On CPAP, but much more alert and answering questions this AM Cardio: regular rate and rhythm GI: soft, wound looks cleaner this AM, not much draiange on the dressing.  ostomy working.  She is not complaining of pain. Skin: site on the left arm is better, still borderline anasarca  Lab Results:  Recent Labs    01/13/2021 1339 01/21/21 0239  WBC 6.8 3.5*  HGB 8.5* 7.3*  HCT 27.1* 24.1*  PLT 199 141*    BMET Recent Labs    12/28/2020 1339 01/21/21 0239  NA 126* 129*  K 4.7 5.0  CL 96* 101  CO2 20* 18*  GLUCOSE 75 121*  BUN 55* 51*  CREATININE 2.19* 2.09*  CALCIUM 8.4* 8.2*   PT/INR Recent Labs    01/09/2021 1339 01/21/21 0239  LABPROT 28.9* 30.8*  INR 2.8* 3.1*    Recent Labs  Lab 01/07/2021 1339  AST  25  ALT 8  ALKPHOS 94  BILITOT 1.2  PROT 6.0*  ALBUMIN 1.9*     Lipase     Component Value Date/Time   LIPASE 16 01/08/2021 1343     Medications: . allopurinol  100 mg Oral Daily  . brimonidine  1 drop Both Eyes BID  . Chlorhexidine Gluconate Cloth  6 each Topical Daily  . cycloSPORINE  1 drop Both Eyes BID  . dorzolamide  1 drop Both Eyes Daily  . FLUoxetine  40 mg Oral Daily  . hydrocortisone sod succinate (SOLU-CORTEF) inj  50 mg Intravenous Q8H  . insulin aspart  0-5 Units Subcutaneous QHS  . insulin aspart  0-9 Units Subcutaneous TID WC  . insulin glargine  5 Units Subcutaneous BID  . latanoprost  1 drop Both Eyes QHS  . mouth rinse  15 mL Mouth Rinse BID  . mycophenolate  180 mg Oral BID  . sodium chloride flush  3 mL Intravenous Q12H  . tacrolimus  1 mg Oral BID    Assessment/Plan Polycystic disease/ESRD with renal transplant 2013-Prograf/prednisone. Elevated Cr - 2.19>>2.09 Hx CHF Chronic COPD/history of tobacco use- On 2L PRN at home Hx Age indeterminate, non occlusive deep vein thrombosis involving the right popliteal vein Hx SVT/Atrial flutter with RVR/TEE/DC cardioversion 12/25/20 Large hematoma left arm - drained and dressed- cleaning up  hematoma still present Anemia - H/H 8.5/27.1>>7.3/24.1 Severe protein calorie malnutrition-Total protein 6.0/albumin 1.9 Hyponatremia Hypercoagulable - INR 2.8 (on Eliquis) Severe malnutrition -prealbumin<5  Sepsis and 3.2 x 2.8 x 5.3 cm abdominal fluid/abscess collection adjacent to enterotomy staple line Hx Perforated sigmoid diverticulitis -S/pExploratory laparotomy with sigmoid colectomy/end colostomy, 12/29/2020 Dr. Coralie Keens Non healing midline abdominal wound  -CT reviewed by Dr. Redmond Pulling recommend IR evaluation for aspiration versus drain placement     of intra-abdominal fluid collection.   HWT:UUEKCM, supplements per RD ID: Cefepime, Flagyl, Vancomycin 2/28>> DVT: INR 2.8 >>3.1  Plan:   Continue antibiotics and ask IR to evaluate for drain/aspiration of the site.  She will need her anticoagulation reversed, and may need a study with contrast.     LOS: 1 day    April [name] 01/21/2021 Please see Amion

## 2021-01-21 NOTE — Progress Notes (Signed)
Pharmacy - IV heparin  Assessment:    Please see note from Leone Haven, PharmD earlier today for full details. Briefly, 65 y.o. female on IV heparin while Eliquis for Afib/recent DCCV on hold. Previously difficult to control on IV heparin, and has had significant bruising of arms.   APTT therapeutic at 87 on 550 units/hr  No new bleeding or infusion issues per RN  Plan:   Continue heparin at 550 units/hr  Daily aPTT, heparin level, and [place], PharmD, BCPS [phone] 01/21/2021, 2:09 PM

## 2021-01-21 NOTE — Progress Notes (Signed)
MD Maylene Roes notified for foley orders. Will monitor.

## 2021-01-21 NOTE — Progress Notes (Addendum)
PROGRESS NOTE    April [name]  KWI:[id number] DOB: [birth year] DOA: 01/10/2021 PCP: [name], MD     Brief Narrative:  April [name] is a 65 y.o. female with medical history significant of paroxysmal atrial fibrillation status post DCCV, chronic hypoxic respiratory failure on 2 L at baseline, severe persistent asthma, suspected OSA, CKD stage IIIa with previous history of ESRD leading to renal transplant, chronic diastolic heart failure, insulin-dependent diabetes, Raynaud's phenomenon, with recent hospitalization for perforated diverticulitis, septic shock who underwent ex lap and colostomy who now presents to the hospital after being found unresponsive at SNF.  She was found to have altered mental status, minimally responsive with oxygen saturation of 74% on 4 L O2.  She required nonrebreather.  Her mental status began to improve a little bit.  She was found to be hypotensive with SBP in the 70s to 80s.  Due to concern for shock, CT chest abdomen pelvis which was significant for possible postop seroma versus developing abscess.  She was started on IV antibiotics.  General surgery consulted and TRH called for admission.  New events last 24 hours / Subjective: Patient blood pressure has improved, 137/74 this morning.  Her oxygen requirements has also decreased back to her baseline of 2 L.  Patient is more awake this morning, although remains on CPAP and does not attempt to answer any questions.  Addendum: Evaluated patient again this afternoon. She is alert, oriented, able to answer most questions appropriately. She admits to some pain in her abdomen. BP and O2 levels remain stable. Daughter at bedside, updated.   Assessment & Plan:   Principal Problem:   Acute metabolic encephalopathy Active Problems:   Renal transplant recipient   Depression   Acute respiratory failure with hypoxia (HCC)   Acute on chronic diastolic CHF (congestive heart failure) (HCC)   Acute renal failure  superimposed on stage 3b chronic kidney disease (HCC)   Hypotension   Acute metabolic encephalopathy -Per EDP report, patient's mentation had improved with initiation of nonrebreather, thought process was that her encephalopathy was due to hypoxia, could also be secondary to hypotension, shock  -Improving  Fluid collection left lower quadrant, postop seroma versus abscess -Technically does not meet sepsis criteria with normal HR, temp, WBC and lactic acid. Hypotension on admission -Recent hospitalization with perforated diverticulitis.  Patient underwent laparotomy with Hartman's procedure on 12/29/20.  Developed postop respiratory failure and shock and was admitted briefly in the ICU -CT abdomen pelvis: Fluid collection left lower quadrant abutting the staple line of the sigmoid colon. Findings could reflect postoperative seroma or abscess -General surgery following -IR consulted for aspiration versus drain placement -Blood culture pending -Vancomycin, cefepime, Flagyl  Hypotension -Resolved.  Continue Solu-Cortef.   Adrenal insufficiency -Takes prednisone 7.5mg  at baseline -Solucortef ordered, wean back to home prednisone dose as tolerated by BP    Paroxysmal A Fib -Status post TEE/DCCV on 12/25/20 -Metoprolol on hold due to hypotension -Eliquis --> IV heparin for now. Cardiology has recommended uninterrupted anticoagulation for 30 days post DCCV -Monitor on telemetry  Supratherapeutic INR -Discussed with pharmacy yesterday regarding possible reversal of Eliquis.  We discussed various agents but elected to hold off on Kcentra at this time due to nonemergent nature of her elected procedure  -?Sepsis etiology of elevated INR vs other. No sign of underlying liver disease, normal LFT and no cirrhosis seen on imaging. Check fibrinogen and d-dimer   Acute on chronic hypoxic respiratory failure with severe persistent asthma, questionable HCAP -Requires  2-4 L of oxygen at  baseline -SPO2 74% on arrival to the emergency department on 4 L oxygen and required nonrebreather on admission -CT chest reveals some small bilateral pleural effusion, bilateral lower lobe atelectasis, patchy nondependent consolidation which could reflect hypoventilatory changes or early infection -Covid, influenza negative -Cefepime, vancomycin as above -MRSA PCR negative  -Now back down to 2 L of oxygen  AKI on CKD stage 3a, history of renal failure with ESRD, status post renal transplant -Baseline creatinine 1.1-1.5  -Combination of infectious process, hypotension. Patient with bladder scan > 312ml without hydronephrosis seen on CT. Foley ordered in critically ill setting -Continue mycophenolate, prograf, solucortef as above (prednisone PTA)  -Mild improvement overnight  Acute on chronic diastolic heart failure -Echocardiogram revealed EF 70% with grade 2 diastolic dysfunction -BNP 188.6 -Patient remains fluid overloaded, +anasarca, with AKI and hypotension. Hold Lasix for now.  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  Needs outpatient sleep study  Hyponatremia -Improved with IV fluid  Depression -Prozac  Gout of elbows -On steroids as above. Continue allopurinol.   Acute urinary retention -Foley placed at time of admission  Diarrhea -Type 7 stools reported. Check C Diff   Goals of care -Palliative care medicine consulted due to patient's critical illness and repeat hospitalization   In agreement with assessment of the pressure ulcer as below:  Pressure Injury 12/29/20 Sacrum Medial Stage 2 -  Partial thickness loss of dermis presenting as a shallow open injury with a red, pink wound bed without slough. (Active)  12/29/20 1130   Location: Sacrum  Location Orientation: Medial  Staging: Stage 2 -  Partial thickness loss of dermis presenting as a shallow open injury with a red, pink wound bed without slough.  Wound Description (Comments):   Present on Admission: No     Nutrition Problem: Increased nutrient needs Etiology: acute illness,wound healing   DVT prophylaxis: IV heparin   Code Status: Full Family Communication: None at bedside this morning during rounds. Will touch base with family this afternoon  Disposition Plan:  Status is: Inpatient  Remains inpatient appropriate because:Hemodynamically unstable, Altered mental status, Ongoing diagnostic testing needed not appropriate for outpatient work up, IV treatments appropriate due to intensity of illness or inability to take PO and Inpatient level of care appropriate due to severity of illness   Dispo: The patient is from: SNF              Anticipated d/c is to: SNF              Patient currently is not medically stable to d/c.   Difficult to place patient No      Consultants:   General surgery  Procedures:   None   Antimicrobials:  Anti-infectives (From admission, onward)   Start     Dose/Rate Route Frequency Ordered Stop   01/21/21 1800  ceFEPIme (MAXIPIME) 2 g in sodium chloride 0.9 % 100 mL IVPB        2 g 200 mL/hr over 30 Minutes Intravenous Every 24 hours 01/19/2021 1742     01/21/21 0000  metroNIDAZOLE (FLAGYL) IVPB 500 mg        500 mg 100 mL/hr over 60 Minutes Intravenous Every 8 hours 01/03/2021 1821     12/25/2020 1800  vancomycin (VANCOCIN) IVPB  1000 mg/200 mL premix        1,000 mg 200 mL/hr over 60 Minutes Intravenous Every 48 hours 01/12/2021 1742     01/08/2021 1545  ceFEPIme (MAXIPIME) 2 g in sodium chloride 0.9 % 100 mL IVPB        2 g 200 mL/hr over 30 Minutes Intravenous  Once 12/30/2020 1544 01/12/2021 1625   01/19/2021 1545  metroNIDAZOLE (FLAGYL) IVPB 500 mg        500 mg 100 mL/hr over 60 Minutes Intravenous  Once 12/27/2020  1544 01/03/2021 1730   01/01/2021 1545  vancomycin (VANCOCIN) IVPB 1000 mg/200 mL premix  Status:  Discontinued        1,000 mg 200 mL/hr over 60 Minutes Intravenous  Once 12/24/2020 1544 01/13/2021 1847        Objective: Vitals:   01/21/21 0616 01/21/21 0749 01/21/21 0750 01/21/21 0800  BP: 116/65   137/74  Pulse: 76   78  Resp: 14   15  Temp:   97.9 F (36.6 C) (!) 97 F (36.1 C)  TempSrc:   Axillary Axillary  SpO2: 100%   100%  Weight:  74.2 kg    Height:        Intake/Output Summary (Last 24 hours) at 01/21/2021 0935 Last data filed at 01/21/2021 0618 Gross per 24 hour  Intake 2667.76 ml  Output 875 ml  Net 1792.76 ml   Filed Weights   01/05/2021 1855 01/21/21 0749  Weight: 71.4 kg 74.2 kg    Examination:  General exam: Appears calm and comfortable  Respiratory system: Clear to auscultation anterior, on CPAP  Cardiovascular system: S1 & S2 heard, RRR. No murmurs. +pedal edema. Gastrointestinal system: Abdomen is nondistended, soft and nontender. +anasarca  Central nervous system: Alert to voice  Extremities: Symmetric in appearance     Data Reviewed: I have personally reviewed following labs and imaging studies  CBC: Recent Labs  Lab 12/28/2020 1339 01/21/21 0239  WBC 6.8 3.5*  NEUTROABS 4.6  --   HGB 8.5* 7.3*  HCT 27.1* 24.1*  MCV 98.5 103.4*  PLT 199 384*   Basic Metabolic Panel: Recent Labs  Lab 12/25/2020 1339 01/21/21 0239  NA 126* 129*  K 4.7 5.0  CL 96* 101  CO2 20* 18*  GLUCOSE 75 121*  BUN 55* 51*  CREATININE 2.19* 2.09*  CALCIUM 8.4* 8.2*   GFR: Estimated Creatinine Clearance: 25.4 mL/min (A) (by C-G formula based on SCr of 2.09 mg/dL (H)). Liver Function Tests: Recent Labs  Lab 01/01/2021 1339  AST 25  ALT 8  ALKPHOS 94  BILITOT 1.2  PROT 6.0*  ALBUMIN 1.9*   Recent Labs  Lab 12/27/2020 1343  LIPASE 16   No results for input(s): AMMONIA in the last 168 hours. Coagulation Profile: Recent Labs  Lab 01/13/2021 1339 01/21/21 0239   INR 2.8* 3.1*   Cardiac Enzymes: No results for input(s): CKTOTAL, CKMB, CKMBINDEX, TROPONINI in the last 168 hours. BNP (last 3 results) No results for input(s): PROBNP in the last 8760 hours. HbA1C: No results for input(s): HGBA1C in the last 72 hours. CBG: Recent Labs  Lab 01/14/21 1150 01/14/21 1615 12/26/2020 2213 01/21/21 0746  GLUCAP 233* 228* 110* 125*   Lipid Profile: No results for input(s): CHOL, HDL, LDLCALC, TRIG, CHOLHDL, LDLDIRECT in the last 72 hours. Thyroid Function Tests: No results for input(s): TSH, T4TOTAL, FREET4, T3FREE, THYROIDAB in the last 72 hours. Anemia Panel: No results for input(s): VITAMINB12, FOLATE, FERRITIN, TIBC, IRON, RETICCTPCT in  the last 72 hours. Sepsis Labs: Recent Labs  Lab 01/14/2021 1339 01/12/2021 1537  LATICACIDVEN 0.8 0.9    Recent Results (from the past 240 hour(s))  SARS CORONAVIRUS 2 (TAT 6-24 HRS) Nasopharyngeal Nasopharyngeal Swab     Status: None   Collection Time: 01/14/21 12:39 AM   Specimen: Nasopharyngeal Swab  Result Value Ref Range Status   SARS Coronavirus 2 NEGATIVE NEGATIVE Final    Comment: (NOTE) SARS-CoV-2 target nucleic acids are NOT DETECTED.  The SARS-CoV-2 RNA is generally detectable in upper and lower respiratory specimens during the acute phase of infection. Negative results do not preclude SARS-CoV-2 infection, do not rule out co-infections with other pathogens, and should not be used as the sole basis for treatment or other patient management decisions. Negative results must be combined with clinical observations, patient history, and epidemiological information. The expected result is Negative.  Fact Sheet for Patients: SugarRoll.be  Fact Sheet for Healthcare Providers: https://www.woods-mathews.com/  This test is not yet approved or cleared by the [place] FDA and  has been authorized for detection and/or diagnosis of SARS-CoV-2 by FDA under an  Emergency Use Authorization (EUA). This EUA will remain  in effect (meaning this test can be used) for the duration of the COVID-19 declaration under Se ction 564(b)(1) of the Act, 21 U.S.C. section 360bbb-3(b)(1), unless the authorization is terminated or revoked sooner.  Performed at [facility], [place] [address], [place], [place] [address]   Resp Panel by RT-PCR (Flu A&B, Covid) Nasopharyngeal Swab     Status: None   Collection Time: 01/15/2021  2:05 PM   Specimen: Nasopharyngeal Swab; Nasopharyngeal(NP) swabs in vial transport medium  Result Value Ref Range Status   SARS Coronavirus 2 by RT PCR NEGATIVE NEGATIVE Final    Comment: (NOTE) SARS-CoV-2 target nucleic acids are NOT DETECTED.  The SARS-CoV-2 RNA is generally detectable in upper respiratory specimens during the acute phase of infection. The lowest concentration of SARS-CoV-2 viral copies this assay can detect is 138 copies/mL. A negative result does not preclude SARS-Cov-2 infection and should not be used as the sole basis for treatment or other patient management decisions. A negative result may occur with  improper specimen collection/handling, submission of specimen other than nasopharyngeal swab, presence of viral mutation(s) within the areas targeted by this assay, and inadequate number of viral copies(<138 copies/mL). A negative result must be combined with clinical observations, patient history, and epidemiological information. The expected result is Negative.  Fact Sheet for Patients:  EntrepreneurPulse.com.au  Fact Sheet for Healthcare Providers:  IncredibleEmployment.be  This test is no t yet approved or cleared by the [place] FDA and  has been authorized for detection and/or diagnosis of SARS-CoV-2 by FDA under an Emergency Use Authorization (EUA). This EUA will remain  in effect (meaning this test can be used) for the duration of the COVID-19 declaration  under Section 564(b)(1) of the Act, 21 U.S.C.section 360bbb-3(b)(1), unless the authorization is terminated  or revoked sooner.       Influenza A by PCR NEGATIVE NEGATIVE Final   Influenza B by PCR NEGATIVE NEGATIVE Final    Comment: (NOTE) The Xpert Xpress SARS-CoV-2/FLU/RSV plus assay is intended as an aid in the diagnosis of influenza from Nasopharyngeal swab specimens and should not be used as a sole basis for treatment. Nasal washings and aspirates are unacceptable for Xpert Xpress SARS-CoV-2/FLU/RSV testing.  Fact Sheet for Patients: EntrepreneurPulse.com.au  Fact Sheet for Healthcare Providers: IncredibleEmployment.be  This test is not yet  approved or cleared by the [place] and has been authorized for detection and/or diagnosis of SARS-CoV-2 by FDA under an Emergency Use Authorization (EUA). This EUA will remain in effect (meaning this test can be used) for the duration of the COVID-19 declaration under Section 564(b)(1) of the Act, 21 U.S.C. section 360bbb-3(b)(1), unless the authorization is terminated or revoked.  Performed at [facility], [place] [address] [place], [place] [address]   MRSA PCR Screening     Status: None   Collection Time: 01/05/2021  6:20 PM   Specimen: Nasopharyngeal  Result Value Ref Range Status   MRSA by PCR NEGATIVE NEGATIVE Final    Comment:        The GeneXpert MRSA Assay (FDA approved for NASAL specimens only), is one component of a comprehensive MRSA colonization surveillance program. It is not intended to diagnose MRSA infection nor to guide or monitor treatment for MRSA infections. Performed at [facility], [place] [address], [place], [place] [address]       Radiology Studies: CT ABDOMEN PELVIS WO CONTRAST  Result Date: 01/15/2021 CLINICAL DATA:  Respiratory failure, history of kidney transplant, recent abdominal surgery with drainage EXAM: CT  CHEST, ABDOMEN AND PELVIS WITHOUT CONTRAST TECHNIQUE: Multidetector CT imaging of the chest, abdomen and pelvis was performed following the standard protocol without IV contrast. Unenhanced CT was performed per clinician order. Lack of IV contrast limits sensitivity and specificity, especially for evaluation of abdominal/pelvic solid viscera. COMPARISON:  05/14/2020, 12/29/2020 FINDINGS: CT CHEST FINDINGS Cardiovascular: Unenhanced imaging of the heart and great vessels demonstrates no pericardial effusion. Normal caliber of the thoracic aorta. Minimal atherosclerosis. Incidental note is made of prominent vascular structures in the left upper extremity consistent with previous AV fistula compatible with history of renal failure. Mediastinum/Nodes: No enlarged mediastinal, hilar, or axillary lymph nodes. Thyroid gland, trachea, and esophagus demonstrate no significant findings. Lungs/Pleura: There are small bilateral pleural effusions, right greater than left. Bilateral lower lobe atelectasis greatest in the right lower lobe. Patchy lingular consolidation may reflect hypoventilatory change versus early infection. No pneumothorax. Central airways are patent. Musculoskeletal: Prior healed bilateral rib fractures. No acute or destructive bony lesions. Reconstructed images demonstrate no additional findings. CT ABDOMEN PELVIS FINDINGS Hepatobiliary: Multiple hepatic cysts are again noted. Otherwise unremarkable unenhanced evaluation of the liver. Small gallstones are identified within the gallbladder with no evidence of acute cholecystitis. Pancreas: Unremarkable. No pancreatic ductal dilatation or surrounding inflammatory changes. Spleen: Mild splenomegaly unchanged.  No focal abnormalities. Adrenals/Urinary Tract: Numerous cysts are seen throughout the native kidneys, unchanged. Right lower quadrant renal transplant is unremarkable. No urinary tract calculi or obstructive uropathy. Bladder is unremarkable without  filling defect. The adrenals are normal. Stomach/Bowel: No bowel obstruction or ileus. Diverting colostomy left lower quadrant is noted. Minimal diverticulosis of the remaining sigmoid colon. There is a fluid collection in the left lower quadrant measuring 3.3 x 2.8 cm on image 92/7, and extending approximately 5.3 cm in craniocaudal direction image 127/4. Fluid collection abuts the enterotomy staple line in the left lower quadrant, and could reflect postoperative seroma or abscess. Evaluation is limited without IV and oral contrast. No bowel wall thickening.  Normal appendix right lower quadrant. Vascular/Lymphatic: Aortic atherosclerosis. No enlarged abdominal or pelvic lymph nodes. Reproductive: Uterus and bilateral adnexa are unremarkable. Other: Postsurgical changes are seen from midline laparotomy, with residual surgical defect and packing. No subcutaneous fluid collection. There is a left inguinal hernia containing a small amount of fluid, though decreased  since prior study. No evidence of bowel herniation. Aside from the left lower quadrant fluid collection described above, no additional areas of intra-abdominal fluid or free gas. Musculoskeletal: Postsurgical changes from left hip arthroplasty. There are no acute or destructive bony lesions. Reconstructed images demonstrate no additional findings. IMPRESSION: 1. Fluid collection left lower quadrant abutting the staple line of the sigmoid colon. Findings could reflect postoperative seroma or abscess. Evaluation limited without IV and oral contrast. 2. Small bilateral pleural effusions and bilateral lower lobe atelectasis, right greater than left. 3. Patchy non dependent consolidation within the lingula, which could reflect hypoventilatory change or early infection. 4. Sequela of end-stage renal disease, with unremarkable transplant kidney right lower quadrant. 5. Postsurgical changes from midline laparotomy and left lower quadrant colostomy. Surgical defect  from the midline laparotomy without fluid collection or abdominal wall abscess. 6. Stable splenomegaly. 7.  Aortic Atherosclerosis (ICD10-I70.0). Electronically Signed   By: Randa Ngo M.D.   On: 12/27/2020 15:14   CT Chest Wo Contrast  Result Date: 01/02/2021 CLINICAL DATA:  Respiratory failure, history of kidney transplant, recent abdominal surgery with drainage EXAM: CT CHEST, ABDOMEN AND PELVIS WITHOUT CONTRAST TECHNIQUE: Multidetector CT imaging of the chest, abdomen and pelvis was performed following the standard protocol without IV contrast. Unenhanced CT was performed per clinician order. Lack of IV contrast limits sensitivity and specificity, especially for evaluation of abdominal/pelvic solid viscera. COMPARISON:  05/14/2020, 12/29/2020 FINDINGS: CT CHEST FINDINGS Cardiovascular: Unenhanced imaging of the heart and great vessels demonstrates no pericardial effusion. Normal caliber of the thoracic aorta. Minimal atherosclerosis. Incidental note is made of prominent vascular structures in the left upper extremity consistent with previous AV fistula compatible with history of renal failure. Mediastinum/Nodes: No enlarged mediastinal, hilar, or axillary lymph nodes. Thyroid gland, trachea, and esophagus demonstrate no significant findings. Lungs/Pleura: There are small bilateral pleural effusions, right greater than left. Bilateral lower lobe atelectasis greatest in the right lower lobe. Patchy lingular consolidation may reflect hypoventilatory change versus early infection. No pneumothorax. Central airways are patent. Musculoskeletal: Prior healed bilateral rib fractures. No acute or destructive bony lesions. Reconstructed images demonstrate no additional findings. CT ABDOMEN PELVIS FINDINGS Hepatobiliary: Multiple hepatic cysts are again noted. Otherwise unremarkable unenhanced evaluation of the liver. Small gallstones are identified within the gallbladder with no evidence of acute cholecystitis.  Pancreas: Unremarkable. No pancreatic ductal dilatation or surrounding inflammatory changes. Spleen: Mild splenomegaly unchanged.  No focal abnormalities. Adrenals/Urinary Tract: Numerous cysts are seen throughout the native kidneys, unchanged. Right lower quadrant renal transplant is unremarkable. No urinary tract calculi or obstructive uropathy. Bladder is unremarkable without filling defect. The adrenals are normal. Stomach/Bowel: No bowel obstruction or ileus. Diverting colostomy left lower quadrant is noted. Minimal diverticulosis of the remaining sigmoid colon. There is a fluid collection in the left lower quadrant measuring 3.3 x 2.8 cm on image 92/7, and extending approximately 5.3 cm in craniocaudal direction image 127/4. Fluid collection abuts the enterotomy staple line in the left lower quadrant, and could reflect postoperative seroma or abscess. Evaluation is limited without IV and oral contrast. No bowel wall thickening.  Normal appendix right lower quadrant. Vascular/Lymphatic: Aortic atherosclerosis. No enlarged abdominal or pelvic lymph nodes. Reproductive: Uterus and bilateral adnexa are unremarkable. Other: Postsurgical changes are seen from midline laparotomy, with residual surgical defect and packing. No subcutaneous fluid collection. There is a left inguinal hernia containing a small amount of fluid, though decreased since prior study. No evidence of bowel herniation. Aside from the left lower quadrant fluid  collection described above, no additional areas of intra-abdominal fluid or free gas. Musculoskeletal: Postsurgical changes from left hip arthroplasty. There are no acute or destructive bony lesions. Reconstructed images demonstrate no additional findings. IMPRESSION: 1. Fluid collection left lower quadrant abutting the staple line of the sigmoid colon. Findings could reflect postoperative seroma or abscess. Evaluation limited without IV and oral contrast. 2. Small bilateral pleural  effusions and bilateral lower lobe atelectasis, right greater than left. 3. Patchy non dependent consolidation within the lingula, which could reflect hypoventilatory change or early infection. 4. Sequela of end-stage renal disease, with unremarkable transplant kidney right lower quadrant. 5. Postsurgical changes from midline laparotomy and left lower quadrant colostomy. Surgical defect from the midline laparotomy without fluid collection or abdominal wall abscess. 6. Stable splenomegaly. 7.  Aortic Atherosclerosis (ICD10-I70.0). Electronically Signed   By: Randa Ngo M.D.   On: 12/29/2020 15:14   DG Chest Portable 1 View  Result Date: 01/18/2021 CLINICAL DATA:  Hypoxia EXAM: PORTABLE CHEST 1 VIEW COMPARISON:  01/01/2021 FINDINGS: Central line is been removed since the prior study. Improved aeration in the bases with residual mild bibasilar airspace disease likely atelectasis. Negative for edema or effusion. IMPRESSION: Mild bibasilar atelectasis Electronically Signed   By: Franchot Gallo M.D.   On: 12/31/2020 14:04      Scheduled Meds: . (feeding supplement) PROSource Plus  30 mL Oral BID BM  . allopurinol  100 mg Oral Daily  . brimonidine  1 drop Both Eyes BID  . Chlorhexidine Gluconate Cloth  6 each Topical Daily  . cycloSPORINE  1 drop Both Eyes BID  . dorzolamide  1 drop Both Eyes Daily  . feeding supplement  237 mL Oral BID BM  . FLUoxetine  40 mg Oral Daily  . hydrocortisone sod succinate (SOLU-CORTEF) inj  50 mg Intravenous Q8H  . insulin aspart  0-5 Units Subcutaneous QHS  . insulin aspart  0-9 Units Subcutaneous TID WC  . insulin glargine  5 Units Subcutaneous BID  . latanoprost  1 drop Both Eyes QHS  . mouth rinse  15 mL Mouth Rinse BID  . multivitamin with minerals  1 tablet Oral Daily  . mycophenolate  180 mg Oral BID  . nutrition supplement (JUVEN)  1 packet Oral BID BM  . sodium chloride flush  3 mL Intravenous Q12H  . tacrolimus  1 mg Oral BID   Continuous  Infusions: . ceFEPime (MAXIPIME) IV    . heparin 550 Units/hr (01/21/21 0618)  . metronidazole Stopped (01/21/21 0917)  . vancomycin Stopped (12/27/2020 2156)     LOS: 1 day      Time spent: 40 minutes   Dessa Phi, DO [facility] 01/21/2021, 9:35 AM   Available via Epic secure chat 7am-7pm After these hours, please refer to coverage provider listed on [email or web address]

## 2021-01-21 NOTE — Progress Notes (Signed)
[place] for IV heparin Indication: atrial fibrillation  Allergies  Allergen Reactions  . Infed [Iron Dextran] Other (See Comments)    Chest tightness  . Pentamidine Itching, Shortness Of Breath and Swelling  . Budesonide-Formoterol Fumarate     Other reaction(s): hoarseness  . Bupropion     Other reaction(s): exacerbated depression  . Crestor [Rosuvastatin]     Other reaction(s): body aches, swelling  . Duloxetine Hcl     Other reaction(s): GI SE, weepy, worsening fatigue, foggy  . Erythromycin [Erythromycin] Other (See Comments)    Mouth Ulcers  . Iohexol Other (See Comments)    Per patient "she has had a kidney transplant and should never have contrast"  . Oxycodone Nausea Only and Nausea And Vomiting  . Pravastatin Sodium     Other reaction(s): myalgias  . Erythromycin Rash    Causes breakout in mouth  . Ultram [Tramadol Hcl] Anxiety    Patient Measurements: Height: 5\' 1"  (154.9 cm) Weight: 71.4 kg (157 lb 6.5 oz) IBW/kg (Calculated) : 47.8 Heparin Dosing Weight: 64 kg  Vital Signs: Temp: 98.8 F (37.1 C) (03/01 0400) Temp Source: Axillary (03/01 0400) BP: 128/42 (03/01 0200) Pulse Rate: 78 (03/01 0200)  Labs: Recent Labs    01/16/2021 1339 01/21/21 0239  HGB 8.5* 7.3*  HCT 27.1* 24.1*  PLT 199 141*  APTT  --  133*  LABPROT 28.9* 30.8*  INR 2.8* 3.1*  HEPARINUNFRC  --  >2.20*  CREATININE 2.19* 2.09*    Estimated Creatinine Clearance: 24.9 mL/min (A) (by C-G formula based on SCr of 2.09 mg/dL (H)).   Medical History: Past Medical History:  Diagnosis Date  . Arthritis   . Asthma   . CHF (congestive heart failure) ([place])   . COPD (chronic obstructive pulmonary disease) ([place])   . Depression   . Essential hypertension 02/08/2020  . GERD (gastroesophageal reflux disease)   . Hyperlipidemia   . Hyperparathyroidism   . Polycystic kidney   . PONV (postoperative nausea and vomiting)     Medications:  Medications  Prior to Admission  Medication Sig Dispense Refill Last Dose  . acetaminophen (TYLENOL) 325 MG tablet Take 2 tablets (650 mg total) by mouth every 6 (six) hours as needed for mild pain (or Fever >/= 101).   Past Week at Unknown time  . allopurinol (ZYLOPRIM) 100 MG tablet Take 100 mg by mouth daily.   01/08/2021 at Unknown time  . amLODipine (NORVASC) 5 MG tablet Take 1 tablet (5 mg total) by mouth daily.   01/15/2021 at Unknown time  . apixaban (ELIQUIS) 5 MG TABS tablet Take 1 tablet (5 mg total) by mouth 2 (two) times daily. 60 tablet  01/10/2021 at 0900  . brimonidine (ALPHAGAN P) 0.1 % SOLN Place 1 drop into both eyes 2 (two) times daily.    01/18/2021 at Unknown time  . Budeson-Glycopyrrol-Formoterol (BREZTRI AEROSPHERE) 160-9-4.8 MCG/ACT AERO Inhale 2 puffs into the lungs 2 (two) times daily. 10.9 g 5 01/19/2021 at Unknown time  . cholecalciferol (VITAMIN D3) 25 MCG (1000 UNIT) tablet Take 1 tablet (1,000 Units total) by mouth daily. 30 tablet 0 01/13/2021 at Unknown time  . colchicine 0.6 MG tablet Take 0.5 tablets (0.3 mg total) by mouth daily.   01/16/2021 at Unknown time  . cyanocobalamin 1000 MCG tablet Take 1,000 mcg by mouth daily.   01/13/2021 at Unknown time  . cycloSPORINE (RESTASIS) 0.05 % ophthalmic emulsion Place 1 drop into both eyes 2 (two) times  daily. 0.4 mL 0 01/11/2021 at Unknown time  . dorzolamide (TRUSOPT) 2 % ophthalmic solution Place 1 drop into both eyes daily.   01/02/2021 at Unknown time  . ferrous sulfate 325 (65 FE) MG tablet Take 325 mg by mouth daily with breakfast.   12/30/2020 at Unknown time  . FLUoxetine (PROZAC) 40 MG capsule Take 1 capsule (40 mg total) by mouth daily. 30 capsule 3 01/17/2021 at Unknown time  . furosemide (LASIX) 40 MG tablet Take 1 tablet (40 mg total) by mouth 2 (two) times daily. 30 tablet  01/19/2021 at Unknown time  . gabapentin (NEURONTIN) 100 MG capsule Take 200 mg by mouth 2 (two) times daily.   01/06/2021 at Unknown time  . Glucerna (GLUCERNA)  LIQD Take 237 mLs by mouth 2 (two) times daily between meals.   01/11/2021 at Unknown time  . guaiFENesin (MUCINEX) 600 MG 12 hr tablet Take 600 mg by mouth 2 (two) times daily.   01/01/2021 at Unknown time  . HYDROcodone-acetaminophen (NORCO/VICODIN) 5-325 MG tablet Take 1 tablet by mouth every 12 (twelve) hours as needed for moderate pain.   01/19/2021 at Unknown time  . insulin aspart (NOVOLOG) 100 UNIT/ML injection Inject 0-9 Units into the skin 3 (three) times daily with meals. CBG 70 - 120: 0 units CBG 121 - 150: 1 unit CBG 151 - 200: 2 units CBG 201 - 250: 3 units CBG 251 - 300: 5 units CBG 301 - 350: 7 units CBG 351 - 400: 9 units 10 mL 11 01/19/2021 at 0800  . insulin glargine (LANTUS) 100 UNIT/ML injection Inject 0.05 mLs (5 Units total) into the skin 2 (two) times daily. 10 mL 11 12/30/2020 at Unknown time  . metoprolol tartrate (LOPRESSOR) 25 MG tablet Take 1 tablet (25 mg total) by mouth 2 (two) times daily.   01/09/2021 at 0900  . mycophenolate (MYFORTIC) 180 MG EC tablet Take 180 mg by mouth 2 (two) times daily.   12/27/2020 at 0900  . Nutritional Supplements (,FEEDING SUPPLEMENT, PROSOURCE PLUS) liquid Take 30 mLs by mouth 2 (two) times daily between meals.   01/11/2021 at Unknown time  . ondansetron (ZOFRAN) 4 MG tablet Take 1 tablet (4 mg total) by mouth every 8 (eight) hours as needed for nausea or vomiting. 30 tablet 1 01/11/2021 at Unknown time  . pantoprazole (PROTONIX) 40 MG tablet Take 1 tablet (40 mg total) by mouth daily.   01/11/2021 at Unknown time  . predniSONE (DELTASONE) 2.5 MG tablet Take 3 tablets (7.5 mg total) by mouth daily with breakfast.   01/19/2021 at Unknown time  . PROGRAF 1 MG capsule Take 1 mg by mouth 2 (two) times daily.    01/07/2021 at 0800  . sodium hypochlorite (DAKIN'S 1/4 STRENGTH) 0.125 % SOLN Irrigate with as directed 2 (two) times daily.  0 01/10/2021 at Unknown time  . TRAVATAN Z 0.004 % SOLN ophthalmic solution Place 1 drop into both eyes at bedtime.    01/19/2021 at Unknown time  . valACYclovir (VALTREX) 500 MG tablet Take 500 mg by mouth 2 (two) times daily.   01/01/2021 at Unknown time  . albuterol (VENTOLIN HFA) 108 (90 Base) MCG/ACT inhaler Inhale 2 puffs into the lungs every 4 (four) hours as needed for wheezing or shortness of breath. 18 g 0 unknown  . ipratropium-albuterol (DUONEB) 0.5-2.5 (3) MG/3ML SOLN Take 3 mLs by nebulization every 6 (six) hours as needed (sob/wheezing).    unknown  . promethazine (PHENERGAN) 25 MG tablet Take 1  tablet (25 mg total) by mouth every 8 (eight) hours as needed for up to 15 doses for nausea or vomiting. 15 tablet 0 unknown   Scheduled:  . allopurinol  100 mg Oral Daily  . brimonidine  1 drop Both Eyes BID  . Chlorhexidine Gluconate Cloth  6 each Topical Daily  . cycloSPORINE  1 drop Both Eyes BID  . dorzolamide  1 drop Both Eyes Daily  . FLUoxetine  40 mg Oral Daily  . hydrocortisone sod succinate (SOLU-CORTEF) inj  50 mg Intravenous Q8H  . insulin aspart  0-5 Units Subcutaneous QHS  . insulin aspart  0-9 Units Subcutaneous TID WC  . insulin glargine  5 Units Subcutaneous BID  . latanoprost  1 drop Both Eyes QHS  . mouth rinse  15 mL Mouth Rinse BID  . mouth rinse  15 mL Mouth Rinse BID  . mycophenolate  180 mg Oral BID  . sodium chloride flush  3 mL Intravenous Q12H  . tacrolimus  1 mg Oral BID   Infusions:  . sodium chloride Stopped (01/21/21 0047)  . ceFEPime (MAXIPIME) IV    . heparin    . metronidazole Stopped (01/21/21 0147)  . vancomycin Stopped (01/05/2021 2156)    Assessment: 38 yoF with PMH PAF s/p DCCV (12/25/20) on Eliquis, chronic resp fail (2L O2 baseline), CKD3 s/p renal txplant, dCHF, DM2, admitted after being [facility] un[facility] at  [facility]. Concern for infection after recent abdominal surgery; Pharmacy consulted to convert Eliquis to IV heparin while possible procedures pending. During last admission, heparin was especially difficult to control, with rates ranging from 1650 units/hr  down to 250 units/hr. Patient also noted to have significant bruising and hematomas on several extremities last admission. However, Cardiology has recommended at least 30 days of anticoagulation after DCCV (through 3/4).   Baseline INR elevated d/t Eliquis; aPTT not done  Prior anticoagulation: Eliquis 5 mg bid, LD 2/28 at 0900  Significant events:  Today, 01/21/2021:  IV heparin gtt infusing @ 750 units/hr  APTT = 133 sec (supratherapeutic)  HL > 2.2 (falsely elevated due to effects of apixaban  APTT/HL drawn at 4 hr instead of 8 hr as ordered; however labs drawn appropriately per RN (not near heparin infusion) and due to elevated results will adjust infusion as per plan below  CBC: hgb low but consistent with prior levels; Plt 141  No bleeding or infusion issues per nursing  Goal of Therapy: APTT 66-102 sec Heparin level 0.3-0.7 units/ml Monitor platelets by anticoagulation protocol: Yes  Plan:  Hold heparin gtt x 1 hr then resume at decreased rate of 550 units/hr  Check aPTT 8 hrs after heparin rate reduced   Daily CBC and daily heparin level, aPTTs as needed while DOAC effects persist  Monitor for signs of bleeding or thrombosis  Leone Haven, PharmD 01/21/2021, 5:16 AM

## 2021-01-21 NOTE — Progress Notes (Signed)
Initial Nutrition Assessment  RD working remotely.  DOCUMENTATION CODES:   Not applicable  INTERVENTION:  - complete NFPE when feasible.  - will order Ensure Enlive BID, each supplement provides 350 kcal and 20 grams of protein. - will order order 30 ml Prosource Plus BID, each supplement provides 100 kcal and 15 grams protein.  - will order 1 tablet multivitamin with minerals/day. - will order Juven BID, each packet provides 95 calories, 2.5 grams of protein (collagen), and 9.8 grams of carbohydrate (3 grams sugar); also contains 7 grams of L-arginine and L-glutamine, 300 mg vitamin C, 15 mg vitamin E, 1.2 mcg vitamin B-12, 9.5 mg zinc, 200 mg calcium, and 1.5 g  Calcium Beta-hydroxy-Beta-methylbutyrate to support wound healing.   NUTRITION DIAGNOSIS:   Increased nutrient needs related to acute illness,wound healing as evidenced by estimated needs.  GOAL:   Patient will meet greater than or equal to 90% of their needs  MONITOR:   PO intake,Supplement acceptance,Labs,Weight trends  REASON FOR ASSESSMENT:   Malnutrition Screening Tool  ASSESSMENT:   65 y.o. 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 She was recently hospitalized for perforated diverticulitis and septic shock and underwent ex lap and colostomy. She presented to the ED after being found unresponsive at SNF with O2 sat of 74% on 4L O2. CT chest and abdomen/pelvis showed possible post-op seroma vs developing abscess. General Surgery consulted.  She is noted to be a/o to self only. No intakes documented since admission. Patient known to this RD from previous admission. Will order oral nutrition supplements as outlined above.   Weight today is 163 lb and weight yesterday was 157 lb. Weight has been fluctuating over the past 3 months.  Flow sheet documentation indicates deep pitting edema to all extremities.   Per notes: - acute metabolic encephalopathy - LLQ fluid collection  - s/p ex lap and Hartman's procedure 2/6 - adrenal insufficiency - AKI on stage 3 CKD - Full Code    Labs reviewed; CBG: 125 mg/dl, Na: 129 mmol/l, BUN: 51 mg/dl, creatinine: 2.09 mg/dl, Ca: 8.2 mg/dl, GFR: 26 ml/min. Medications reviewed; 50 mg solu-cortef TID, sliding scale novolog, 5 units lantus BID.    NUTRITION - FOCUSED PHYSICAL EXAM:  unable to complete at this time.   Diet Order:   Diet Order            Diet NPO time specified Except for: Sips with Meds  Diet effective ____                 EDUCATION NEEDS:   Not appropriate for education at this time  Skin:  Skin Integrity Issues:: Stage II,Other (Comment) Stage II: sacrum Other: MASD bilateral buttocks  Last BM:  3/1 (type 7 x1)  Height:   Ht Readings from Last 1 Encounters:  01/13/2021 5\' 1"  (1.549 m)    Weight:   Wt Readings from Last 1 Encounters:  01/21/21 74.2 kg    Estimated Nutritional Needs:  Kcal:  1800-2000 kcal Protein:  85-100 grams Fluid:  >/= 1.5 L/day      Jarome Matin, MS, RD, LDN, CNSC Inpatient Clinical Dietitian RD pager # available in [place]  After hours/weekend pager # available in [facility]

## 2021-01-21 NOTE — Consult Note (Signed)
Consultation Note Date: 01/21/2021   Patient Name: April [name]  DOB: 16-Dec-1955  MRN: [id number]  Age / Sex: 65 y.o., female   PCP: [name], MD Referring Physician: Dessa Phi, DO   REASON FOR CONSULTATION:Establishing goals of care  Palliative Care consult requested for goals of care discussion in this 65 y.o. female with multiple medical problems including paroxysmal atrial fibrillation status post DCCV, chronic hypoxic respiratory failure (2 L at baseline), severe persistent asthma, CKD stage IIIa with previous history of ESRD leading to renal transplant, chronic diastolic heart failure, insulin-dependent diabetes, Raynaud's, and recent hospitalization for perforated diverticulitis, septic shock who underwent ex lap and colostomy. Patient presented to ED from SNF after being found unresponsive and oxygen 74% on 4L. During work-up CT chest abdomen pelvis showed possible postop seroma versus developing abscess. Patient was started on IV antibiotics.   Clinical Assessment and Goals of Care: I have reviewed medical records including lab results, imaging, Epic notes, and MAR.  I spoke at length with patient's daughter, [place] Riesen [facility]) to discuss diagnosis prognosis, [place], EOL wishes, disposition and options.  Patient is familiar to Palliative team. We were involved in patient's care during previous admissions. I re-introduced Palliative Medicine as specialized medical care for people living with serious illness. It focuses on providing relief from the symptoms and stress of a serious illness. The goal is to improve quality of life for both the patient and the family. Daughter verbalized understanding and appreciation.   We discussed a brief life review of the patient, along with her functional and nutritional status. Patient is a retired Education officer, museum. She lives in the home with her husband of more than 32 years. Patient's son is a Ship broker at [employer].   Prior to  patient's recent illnesses and surgery she was able to perform most ADLs with assistance. Daughter shares since recent surgery/resection patient has had a slow recovery. Appetite has be minimal with some difficulty with certain foods. She discharged to [facility] rehab after last hospitalization.   We discussed Her current illness and what it means in the larger context of Her on-going co-morbidities. Natural disease trajectory and expectations at EOL were discussed.  A detailed discussion was had today regarding advanced directives.  Concepts specific to code status, artifical feeding and hydration, continued IV antibiotics and rehospitalization. The difference between a aggressive medical intervention and a palliative comfort care path were discussed at length. Values and goals of care important to patient and family were attempted to be elicited.   [name] is clear in expressing goals to continue to treat patient aggressively with hopes of providing her and her body every opportunity to improve/stabilize. Daughter verbalizes awareness of patient's condition and we discussed best case and worst case scenario. Daughter states she would like to continue taking things day by day and make decisions as needed.   [name] shares family is considering taking patient home with home health support to have better visibility and support for patient.   We discussed at length patient's full code status with consideration of her current illness, hospitalizations, and co-morbidities. [name] shares when her mother was alert and able to have appropriate discussions she expressed wishes for full aggressive interventions including heroic measures, intubation, and surgeries if needed. [name] wishes to respect her mother's wishes.    I discussed the importance of continued conversation with family and their medical providers regarding overall plan of care and treatment options, ensuring decisions are within the context  of the patients values and GOCs.  Hospice and Palliative Care services outpatient were explained and offered. Patient and family verbalized their understanding and awareness of both palliative and hospice's goals and philosophy of care. Daughter expressed family is not interested in hospice discussions at this time but is open to outpatient Palliative support. This was also recommended at discharge during previous hospitalization however daughter does not think this occurred.   Questions and concerns were addressed. The family was encouraged to call with questions or concerns.  PMT will continue to support holistically as needed.   CODE STATUS: Full code  ADVANCE DIRECTIVES: Primary Decision Maker: [name] (Daughter/HCPOA)    SYMPTOM MANAGEMENT:attending   Palliative Prophylaxis:   Aspiration, Bowel Regimen, Delirium Protocol, Eye Care, Frequent Pain Assessment, Oral Care, Palliative Wound Care and Turn Reposition  PSYCHO-SOCIAL/SPIRITUAL:  Support System: Family  Desire for further Chaplaincy support:No   Additional Recommendations (Limitations, Scope, Preferences):  Full Scope Treatment  Education on hospice/palliative    PAST MEDICAL HISTORY: Past Medical History:  Diagnosis Date  . Arthritis   . Asthma   . CHF (congestive heart failure) ([place])   . COPD (chronic obstructive pulmonary disease) ([place])   . Depression   . Essential hypertension 02/08/2020  . GERD (gastroesophageal reflux disease)   . Hyperlipidemia   . Hyperparathyroidism   . Polycystic kidney   . PONV (postoperative nausea and vomiting)     ALLERGIES:  is allergic to infed [iron dextran], pentamidine, budesonide-formoterol fumarate, bupropion, crestor [rosuvastatin], duloxetine hcl, erythromycin [erythromycin], iohexol, oxycodone, pravastatin sodium, erythromycin, and ultram [tramadol hcl].   MEDICATIONS:  Current Facility-Administered Medications  Medication Dose Route Frequency Provider Last  Rate Last Admin  . (feeding supplement) PROSource Plus liquid 30 mL  30 mL Oral BID BM Dessa Phi, DO      . acetaminophen (TYLENOL) tablet 650 mg  650 mg Oral Q6H PRN Dessa Phi, DO       Or  . acetaminophen (TYLENOL) suppository 650 mg  650 mg Rectal Q6H PRN Dessa Phi, DO      . allopurinol (ZYLOPRIM) tablet 100 mg  100 mg Oral Daily Dessa Phi, DO   100 mg at 01/21/21 0957  . brimonidine (ALPHAGAN) 0.15 % ophthalmic solution 1 drop  1 drop Both Eyes BID Dessa Phi, DO   1 drop at 01/21/21 1001  . ceFEPIme (MAXIPIME) 2 g in sodium chloride 0.9 % 100 mL IVPB  2 g Intravenous Q24H Adrian Saran, [facility]      . Chlorhexidine Gluconate Cloth 2 % PADS 6 each  6 each Topical Daily Dessa Phi, DO   6 each at 01/21/21 1001  . cycloSPORINE (RESTASIS) 0.05 % ophthalmic emulsion 1 drop  1 drop Both Eyes BID Dessa Phi, DO   1 drop at 01/21/21 0958  . dorzolamide (TRUSOPT) 2 % ophthalmic solution 1 drop  1 drop Both Eyes Daily Dessa Phi, DO   1 drop at 01/21/21 1000  . feeding supplement (ENSURE ENLIVE / ENSURE PLUS) liquid 237 mL  237 mL Oral BID BM Dessa Phi, DO      . FLUoxetine (PROZAC) capsule 40 mg  40 mg Oral Daily Dessa Phi, DO   40 mg at 01/21/21 0957  . heparin ADULT infusion 100 units/mL (25000 units/243mL)  550 Units/hr Intravenous Continuous Poindexter, Leann T, RPH 5.5 mL/hr at 01/21/21 0618 550 Units/hr at 01/21/21 0618  . hydrocortisone sodium succinate (SOLU-CORTEF) 100 MG injection 50 mg  50 mg Intravenous Q8H Dessa Phi, DO  50 mg at 01/21/21 0527  . insulin aspart (novoLOG) injection 0-5 Units  0-5 Units Subcutaneous QHS Dessa Phi, DO      . insulin aspart (novoLOG) injection 0-9 Units  0-9 Units Subcutaneous TID WC Dessa Phi, DO   1 Units at 01/21/21 0818  . insulin glargine (LANTUS) injection 5 Units  5 Units Subcutaneous BID Dessa Phi, DO   5 Units at 01/21/21 3235  . latanoprost (XALATAN) 0.005 % ophthalmic solution 1 drop   1 drop Both Eyes QHS Dessa Phi, DO   1 drop at 01/04/2021 2219  . MEDLINE mouth rinse  15 mL Mouth Rinse BID Dessa Phi, DO   15 mL at 01/21/21 1000  . metroNIDAZOLE (FLAGYL) IVPB 500 mg  500 mg Intravenous Q8H Dessa Phi, DO   Stopped at 01/21/21 5732  . multivitamin with minerals tablet 1 tablet  1 tablet Oral Daily Dessa Phi, DO   1 tablet at 01/21/21 1001  . mycophenolate (MYFORTIC) EC tablet 180 mg  180 mg Oral BID Dessa Phi, DO   180 mg at 01/21/21 2025  . nutrition supplement (JUVEN) (JUVEN) powder packet 1 packet  1 packet Oral BID BM Dessa Phi, DO      . ondansetron [facility]) tablet 4 mg  4 mg Oral Q6H PRN Dessa Phi, DO       Or  . ondansetron [facility]) injection 4 mg  4 mg Intravenous Q6H PRN Dessa Phi, DO      . sodium chloride flush (NS) 0.9 % injection 3 mL  3 mL Intravenous Q12H Dessa Phi, DO   3 mL at 01/05/2021 2217  . tacrolimus (PROGRAF) capsule 1 mg  1 mg Oral BID Dessa Phi, DO   1 mg at 01/21/21 1000  . vancomycin (VANCOCIN) IVPB 1000 mg/200 mL premix  1,000 mg Intravenous Q48H Adrian Saran, [facility]   Stopped at 01/14/2021 2156    VITAL SIGNS: BP (!) 122/48 (BP Location: Left Leg)   Pulse 79   Temp (!) 97.3 F (36.3 C) (Oral)   Resp 17   Ht 5\' 1"  (1.549 m)   Wt 74.2 kg   SpO2 100%   BMI 30.91 kg/m  Filed Weights   01/15/2021 1855 01/21/21 0749  Weight: 71.4 kg 74.2 kg    Estimated body mass index is 30.91 kg/m as calculated from the following:   Height as of this encounter: 5\' 1"  (1.549 m).   Weight as of this encounter: 74.2 kg.  LABS: CBC:    Component Value Date/Time   WBC 3.5 (L) 01/21/2021 0239   HGB 7.3 (L) 01/21/2021 0239   HGB 9.0 (L) 11/04/2020 0952   HGB 10.6 (L) 12/29/2010 1522   HCT 24.1 (L) 01/21/2021 0239   HCT 31.2 (L) 12/29/2010 1522   PLT 141 (L) 01/21/2021 0239   PLT 241 11/04/2020 0952   PLT 205 12/29/2010 1522   Comprehensive Metabolic Panel:    Component Value Date/Time   NA 129 (L)  01/21/2021 0239   K 5.0 01/21/2021 0239   BUN 51 (H) 01/21/2021 0239   CREATININE 2.09 (H) 01/21/2021 0239   CREATININE 1.28 (H) 11/04/2020 0952   ALBUMIN 1.9 (L) 01/05/2021 1339     Review of Systems  Unable to perform ROS: Acuity of condition    Prognosis: Extremely Guarded  Discharge Planning:  To Be Determined  Recommendations: . Full Code-as confirmed by daughter . Continue with current plan of care/Full Scope/Aggressive interventions . Family remains hopeful for improvement/stability. Daughter  reports patient expressed wishes for heroic measures, surgeries, and any medical interventions recommended to allow for her an opportunity to improve.  . Outpatient Palliative support at discharge . PMT will continue to support and follow as needed. Please call team line with urgent needs.   Palliative Performance Scale:                Daughter, [name] expressed understanding and was in agreement with this plan.   Thank you for allowing the Palliative Medicine Team to assist in the care of this patient. Please utilize secure chat with additional questions, if there is no response within 30 minutes please call the above phone number.   Time In: 3845 Time Out: 1335 Time Total:50 min.   Visit consisted of counseling and education dealing with the complex and emotionally intense issues of symptom management and palliative care in the setting of serious and potentially life-threatening illness.Greater than 50%  of this time was spent counseling and coordinating care related to the above assessment and plan.  Signed by:  Alda Lea, AGPCNP-BC Palliative Medicine Team  Phone: [phone] Pager: [phone] Amion: [place] Team providers are available by phone from 7am to 7pm daily and can be reached through the team cell phone.  Should this patient require assistance outside of these hours, please call the patient's attending physician.

## 2021-01-21 NOTE — TOC Initial Note (Signed)
Transition of Care [facility] - Initial/Assessment Note    Patient Details  Name: April [name] MRN: [id number] Date of Birth: 1956-01-09  Transition of [facility]) CM/SW Contact:    Leeroy Cha, RN Phone Number: 01/21/2021, 7:33 AM  Clinical Narrative:                  65 y.o. female with medical history significant of paroxysmal atrial fibrillation status post DCCV, chronic hypoxic respiratory failure on 2 L at baseline, severe persistent asthma, suspected OSA, CKD stage IIIa with previous history of ESRD leading to renal transplant, chronic diastolic heart failure, insulin-dependent diabetes, Raynaud's phenomenon, with recent hospitalization for perforated diverticulitis, septic shock who underwent ex lap and colostomy who now presents to the hospital after being found unresponsive at SNF.  She was found to have altered mental status, minimally responsive with oxygen saturation of 74% on 4 L O2.  She required nonrebreather.  Her mental status began to improve a little bit.  She was found to be hypotensive with SBP in the 70s to 80s.  Due to concern for septic shock, CT chest abdomen pelvis which was significant for possible postop seroma versus developing abscess.  She was started on IV antibiotics. PLAN: to return to [facility] when stable. Contact is the daughter [name] at [phone]. Expected Discharge Plan: Skilled Nursing Facility Barriers to Discharge: Continued Medical Work up   Patient Goals and CMS Choice Patient states their goals for this hospitalization and ongoing recovery are:: to go back to snf-daughter CMS Medicare.gov Compare Post Acute Care list provided to:: Patient Represenative (must comment)    Expected Discharge Plan and Services Expected Discharge Plan: [place]   Discharge Planning Services: CM Consult Post Acute Care Choice: [place] Living arrangements for the past 2 months: Single Family Home                                       Prior Living Arrangements/Services Living arrangements for the past 2 months: Single Family Home Lives with:: Spouse Patient language and need for interpreter reviewed:: Yes Do you feel safe going back to the place where you live?: Yes      Need for Family Participation in Patient Care: Yes (Comment) Care giver support system in place?: Yes (comment)   Criminal Activity/Legal Involvement Pertinent to Current Situation/Hospitalization: No - Comment as needed  Activities of Daily Living Home Assistive Devices/Equipment: Eyeglasses,Hospital bed,Wheelchair,Oxygen ADL Screening (condition at time of admission) Patient's cognitive ability adequate to safely complete daily activities?: No Is the patient deaf or have difficulty hearing?: No Does the patient have difficulty seeing, even when wearing glasses/contacts?: Yes Does the patient have difficulty concentrating, remembering, or making decisions?: No Patient able to express need for assistance with ADLs?: No Does the patient have difficulty dressing or bathing?: Yes Independently performs ADLs?: No Communication: Needs assistance Is this a change from baseline?: Pre-admission baseline Dressing (OT): Needs assistance Is this a change from baseline?: Pre-admission baseline Grooming: Needs assistance Is this a change from baseline?: Pre-admission baseline Feeding: Needs assistance Is this a change from baseline?: Pre-admission baseline Bathing: Needs assistance Is this a change from baseline?: Pre-admission baseline Toileting: Needs assistance Is this a change from baseline?: Pre-admission baseline In/Out Bed: Needs assistance Is this a change from baseline?: Pre-admission baseline Walks in Home: Dependent Is this a change from baseline?: Pre-admission baseline Does  the patient have difficulty walking or climbing stairs?: Yes Weakness of Legs: Both Weakness of Arms/Hands: Both  Permission  Sought/Granted                  Emotional Assessment Appearance:: Appears stated age Attitude/Demeanor/Rapport: Unable to Assess Affect (typically observed): Unable to Assess Orientation: : Fluctuating Orientation (Suspected and/or reported Sundowners) Alcohol / Substance Use: Not Applicable Psych Involvement: No (comment)  Admission diagnosis:  Hypoxia [R09.02] Hypotension [I95.9] Abdominal pain, unspecified abdominal location [R10.9] Hypotension, unspecified hypotension type [I95.9] Patient Active Problem List   Diagnosis Date Noted  . Hypotension 01/12/2021  . Acute metabolic encephalopathy 97/67/3419  . Difficult intravenous access   . Bilious vomiting with nausea   . Sigmoid diverticulitis 12/16/2020  . Acute CHF (congestive heart failure) ([place]) 12/16/2020  . Fever of unknown origin 10/24/2020  . Fever of unknown origin (FUO) 10/23/2020  . Lower GI bleeding 09/25/2020  . Diarrhea 09/10/2020  . Acute renal failure superimposed on stage 3b chronic kidney disease ([place]) 09/08/2020  . Acute diverticulitis 09/04/2020  . SIRS (systemic inflammatory response syndrome) ([place]) 07/02/2020  . Chronic kidney disease, stage 3a ([place])   . 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 History of immunosuppressive therapy 05/27/2020  . FUO (fever of unknown origin) 05/15/2020  . Immunocompromised state ([place])   . Decubitus ulcer of coccyx, unstageable ([place]) 05/01/2020  . Pericardial effusion 04/16/2020  . Chronic respiratory failure with hypoxia ([place]) 04/16/2020  . Proteinuria 04/16/2020  . Pressure injury of skin of buttock 04/08/2020  . COPD (chronic obstructive pulmonary disease) ([place]) 04/07/2020  . SVT (supraventricular tachycardia) ([place])   . Anemia of chronic disease   . Palliative care encounter   . Goals of care, counseling/discussion   . Essential hypertension 02/08/2020  . Glaucoma 02/08/2020  . Sepsis ([place]) 01/31/2020  . SOB (shortness of breath)   . Fracture of left  superior pubic ramus (HCC)   . Pain   . Anxiety state   . Left displaced femoral neck fracture ([place]) 12/15/2019  . Status post total hip replacement, left   . Post-op pain   . Polycystic kidney   . Closed left hip fracture ([place]) 12/07/2019  . Left wrist fracture 12/07/2019  . Medication management 09/22/2019  . Physical deconditioning 09/22/2019  . Acute respiratory failure with hypoxia ([place]) 04/19/2019  . Acute on chronic diastolic CHF (congestive heart failure) ([place]) 04/19/2019  . Bilateral closed proximal tibial fracture 12/21/2018  . Asthma, chronic, unspecified asthma severity, with acute exacerbation 10/03/2018  . Chronic diastolic CHF (congestive heart failure) ([place]) 10/01/2017  . Asthma, mild intermittent 07/15/2016  . GERD without esophagitis 07/15/2016  . Renal transplant recipient 07/15/2016  . Hyperlipidemia 07/15/2016  . Depression 07/15/2016  . Bronchitis, mucopurulent recurrent ([place]) 08/08/2014  . Chronic cough 07/24/2014  . End stage renal disease ([place]) 03/23/2012  . 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, implant, and graft 03/23/2012   PCP:  [name], MD Pharmacy:   [facility], [place] [address] Phone: [phone] Fax: [phone]     Social Determinants of Health (SDOH) Interventions    Readmission Risk Interventions Readmission Risk Prevention Plan 05/15/2020 04/19/2020 02/09/2020  Transportation Screening Complete Complete Complete  PCP or Specialist Appt within 3-5 Days - - -  Not Complete comments - - -  HRI or [place] or Home Care Consult comments - - -  Social Work Scientific laboratory technician for [place] consult not completed comments - - -  Palliative Care Screening - - -  Medication Review (RN Transport planner) Referral to Pharmacy Complete Referral to Pharmacy  PCP or Specialist appointment within 3-5 days of  discharge Complete Complete Complete  HRI or Home Care Consult Complete Complete Complete  SW Recovery Care/Counseling Consult Complete Complete -  Palliative Care Screening Not Applicable Not Applicable Complete  Skilled Nursing Facility Complete Complete Patient Refused  Some recent data might be hidden

## 2021-01-21 NOTE — Consult Note (Signed)
Chief Complaint: Patient was seen in consultation today for image guided aspiration VS drain placement for intra-abdominal fluid collection Chief Complaint  Patient presents with  . Hypotension  . hypoxia   at the request of Paschal Dopp, Dr. Wilnette Kales  Referring Physician(s): Georges Lynch PA-C, Dr. Wilnette Kales  Supervising Physician: Markus Daft  Patient Status: [facility] - In-pt  History of Present Illness: April [name] is a 65 y.o. female new to our service. Her past medical history includes paroxysmal atrial fibrillation s/p DCCV on 12/25/2020, chronic hypoxic respiratory failure on 2 L at baseline, CKD stage IIIa with previous history of ESRD leading to renal transplant, CHF, insulin-dependent diabetes, Raynaud's phenomenon, and recent hospitalization for perforated diverticulitis and septic shock who underwent ex lap and colostomy on 12/29/20.   Patient was discharged on 01/14/2021 to SNF. Unfortunately, she presented to [facility] long ED on 01/19/2021 after being found unresponsive at [facility].    In ED, patient was found to have  AMS, oxygen saturation of 74% on 4 L, hypotension with SBP in 70s to 80s.  Patient underwent CT CAP w/o contrast due to concern for septic shock which revealed possible postop seroma versus developing absces.  Patient was admitted to ICU for further work-up and treatment of her medical conditions.   IR was requested for image guided aspiration VS drain placement for intra-abdominal fluid collection by CCS.  Patient laying in bed with her daughter at the bedside, not in acute distress.  Patient appears to be fatigued, but able to answer questions, follow command, and carry short conversations. ROS was obtained from daughter and states that the patient is doing much better than yesterday and is not complaining of any pain at the moment.   Past Medical History:  Diagnosis Date  . Arthritis   . Asthma   . CHF (congestive heart failure) ([place])   . COPD  (chronic obstructive pulmonary disease) ([place])   . Depression   . Essential hypertension 02/08/2020  . GERD (gastroesophageal reflux disease)   . Hyperlipidemia   . Hyperparathyroidism   . Polycystic kidney   . PONV (postoperative nausea and vomiting)     Past Surgical History:  Procedure Laterality Date  . 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 BILATERAL OOPHORECTOMY    . BIOPSY  05/19/2020   Procedure: BIOPSY;  Surgeon: Carol Ada, MD;  Location: [place];  Service: Endoscopy;;  . CARDIOVERSION N/A 12/25/2020   Procedure: CARDIOVERSION;  Surgeon: Geralynn Rile, MD;  Location: [place];  Service: Cardiovascular;  Laterality: N/A;  . CARPAL TUNNEL RELEASE  2000  . CESAREAN SECTION    . COLONOSCOPY WITH PROPOFOL N/A 05/19/2020   Procedure: COLONOSCOPY WITH PROPOFOL;  Surgeon: Carol Ada, MD;  Location: [place];  Service: Endoscopy;  Laterality: N/A;  . ENTEROSCOPY N/A 05/19/2020   Procedure: ENTEROSCOPY;  Surgeon: Carol Ada, MD;  Location: [place];  Service: Endoscopy;  Laterality: N/A;  . INSERTION OF DIALYSIS CATHETER  03/31/2012   Procedure: INSERTION OF DIALYSIS CATHETER;  Surgeon: Angelia Mould, MD;  Location: [place];  Service: Vascular;  Laterality: N/A;  insertion of dialysis catheter right internal jugular vein  . 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 LAPAROTOMY N/A 12/29/2020   Procedure: EXPLORATORY LAPAROTOMY, SIGMOID COLECTOMY AND COLOSTOMY;  Surgeon: Coralie Keens, MD;  Location: [facility];  Service: General;  Laterality: N/A;  Packed Dressing  . ORIF TIBIA FRACTURE Left 12/23/2018   Procedure: OPEN  REDUCTION INTERNAL FIXATION (ORIF) TIBIA FRACTURE;  Surgeon: Shona Needles, MD;  Location: [place];  Service: Orthopedics;  Laterality: Left;  . ORIF TIBIA PLATEAU Right 12/23/2018   Procedure: OPEN REDUCTION INTERNAL FIXATION (ORIF) TIBIAL PLATEAU;  Surgeon: Shona Needles, MD;  Location: [place];  Service: Orthopedics;   Laterality: Right;  . ORIF WRIST FRACTURE Left 12/08/2019   Procedure: OPEN REDUCTION INTERNAL FIXATION (ORIF) WRIST FRACTURE, REPAIR LACERATION LEFT FOREARM;  Surgeon: Dorna Leitz, MD;  Location: [facility];  Service: Orthopedics;  Laterality: Left;  . TEE WITHOUT CARDIOVERSION N/A 12/25/2020   Procedure: TRANSESOPHAGEAL ECHOCARDIOGRAM (TEE);  Surgeon: Geralynn Rile, MD;  Location: [place];  Service: Cardiovascular;  Laterality: N/A;  . [place]  . TOTAL HIP ARTHROPLASTY Left 12/08/2019   Procedure: TOTAL HIP ARTHROPLASTY ANTERIOR APPROACH;  Surgeon: Dorna Leitz, MD;  Location: [facility];  Service: Orthopedics;  Laterality: Left;  . TUBAL LIGATION  2010    Allergies: Infed [iron dextran], Pentamidine, Budesonide-formoterol fumarate, Bupropion, Crestor [rosuvastatin], Duloxetine hcl, Erythromycin [erythromycin], Iohexol, Oxycodone, Pravastatin sodium, Erythromycin, and Ultram [tramadol hcl]  Medications: Prior to Admission medications   Medication Sig Start Date End Date Taking? Authorizing Provider  acetaminophen (TYLENOL) 325 MG tablet Take 2 tablets (650 mg total) by mouth every 6 (six) hours as needed for mild pain (or Fever >/= 101). 05/22/20  Yes Elgergawy, Silver Huguenin, MD  allopurinol (ZYLOPRIM) 100 MG tablet Take 100 mg by mouth daily. 11/29/20  Yes [provider]  amLODipine (NORVASC) 5 MG tablet Take 1 tablet (5 mg total) by mouth daily. 01/15/21  Yes Mercy Riding, MD  apixaban (ELIQUIS) 5 MG TABS tablet Take 1 tablet (5 mg total) by mouth 2 (two) times daily. 01/14/21  Yes Mercy Riding, MD  brimonidine (ALPHAGAN P) 0.1 % SOLN Place 1 drop into both eyes 2 (two) times daily.    Yes [provider]  Budeson-Glycopyrrol-Formoterol (BREZTRI AEROSPHERE) 160-9-4.8 MCG/ACT AERO Inhale 2 puffs into the lungs 2 (two) times daily. 08/20/20  Yes Martyn Ehrich, NP  cholecalciferol (VITAMIN D3) 25 MCG (1000 UNIT) tablet Take 1 tablet (1,000 Units total) by mouth  daily. 01/04/20  Yes Angiulli, Lavon Paganini, PA-C  colchicine 0.6 MG tablet Take 0.5 tablets (0.3 mg total) by mouth daily. 01/15/21  Yes Mercy Riding, MD  cyanocobalamin 1000 MCG tablet Take 1,000 mcg by mouth daily.   Yes [provider]  cycloSPORINE (RESTASIS) 0.05 % ophthalmic emulsion Place 1 drop into both eyes 2 (two) times daily. 01/04/20  Yes Angiulli, Lavon Paganini, PA-C  dorzolamide (TRUSOPT) 2 % ophthalmic solution Place 1 drop into both eyes daily. 06/25/19  Yes [provider]  ferrous sulfate 325 (65 FE) MG tablet Take 325 mg by mouth daily with breakfast.   Yes [provider]  FLUoxetine (PROZAC) 40 MG capsule Take 1 capsule (40 mg total) by mouth daily. 01/04/20  Yes Angiulli, Lavon Paganini, PA-C  furosemide (LASIX) 40 MG tablet Take 1 tablet (40 mg total) by mouth 2 (two) times daily. 01/14/21  Yes Mercy Riding, MD  gabapentin (NEURONTIN) 100 MG capsule Take 200 mg by mouth 2 (two) times daily.   Yes [provider]  Glucerna (GLUCERNA) LIQD Take 237 mLs by mouth 2 (two) times daily between meals.   Yes [provider]  guaiFENesin (MUCINEX) 600 MG 12 hr tablet Take 600 mg by mouth 2 (two) times daily.   Yes [provider]  HYDROcodone-acetaminophen (NORCO/VICODIN) 5-325 MG  tablet Take 1 tablet by mouth every 12 (twelve) hours as needed for moderate pain.   Yes [provider]  insulin aspart (NOVOLOG) 100 UNIT/ML injection Inject 0-9 Units into the skin 3 (three) times daily with meals. CBG 70 - 120: 0 units CBG 121 - 150: 1 unit CBG 151 - 200: 2 units CBG 201 - 250: 3 units CBG 251 - 300: 5 units CBG 301 - 350: 7 units CBG 351 - 400: 9 units 01/14/21  Yes Gonfa, Taye T, MD  insulin glargine (LANTUS) 100 UNIT/ML injection Inject 0.05 mLs (5 Units total) into the skin 2 (two) times daily. 01/14/21  Yes Mercy Riding, MD  metoprolol tartrate (LOPRESSOR) 25 MG tablet Take 1 tablet (25 mg total) by mouth 2 (two) times daily. 01/14/21  Yes  Mercy Riding, MD  mycophenolate (MYFORTIC) 180 MG EC tablet Take 180 mg by mouth 2 (two) times daily.   Yes [provider]  Nutritional Supplements (,FEEDING SUPPLEMENT, PROSOURCE PLUS) liquid Take 30 mLs by mouth 2 (two) times daily between meals. 01/14/21  Yes Mercy Riding, MD  ondansetron (ZOFRAN) 4 MG tablet Take 1 tablet (4 mg total) by mouth every 8 (eight) hours as needed for nausea or vomiting. 01/14/21 01/14/22 Yes Mercy Riding, MD  pantoprazole (PROTONIX) 40 MG tablet Take 1 tablet (40 mg total) by mouth daily. 01/15/21  Yes Mercy Riding, MD  predniSONE (DELTASONE) 2.5 MG tablet Take 3 tablets (7.5 mg total) by mouth daily with breakfast. 01/15/21  Yes Gonfa, Taye T, MD  PROGRAF 1 MG capsule Take 1 mg by mouth 2 (two) times daily.  03/22/19  Yes [provider]  sodium hypochlorite (DAKIN'S 1/4 STRENGTH) 0.125 % SOLN Irrigate with as directed 2 (two) times daily. 01/14/21  Yes Gonfa, Charlesetta Ivory, MD  TRAVATAN Z 0.004 % SOLN ophthalmic solution Place 1 drop into both eyes at bedtime. 07/03/16  Yes [provider]  valACYclovir (VALTREX) 500 MG tablet Take 500 mg by mouth 2 (two) times daily.   Yes [provider]  albuterol (VENTOLIN HFA) 108 (90 Base) MCG/ACT inhaler Inhale 2 puffs into the lungs every 4 (four) hours as needed for wheezing or shortness of breath. 01/04/20   Angiulli, Lavon Paganini, PA-C  ipratropium-albuterol (DUONEB) 0.5-2.5 (3) MG/3ML SOLN Take 3 mLs by nebulization every 6 (six) hours as needed (sob/wheezing).  08/24/20   [provider]  promethazine (PHENERGAN) 25 MG tablet Take 1 tablet (25 mg total) by mouth every 8 (eight) hours as needed for up to 15 doses for nausea or vomiting. 09/02/20   Lennice Sites, DO     Family History  Problem Relation Age of Onset  . Hypertension Mother   . Heart disease Father        CABG history  . Polycystic kidney disease Father   . Polycystic kidney disease Brother     Social History    Socioeconomic History  . Marital status: Married    Spouse name: Not on file  . Number of children: Not on file  . Years of education: Not on file  . Highest education level: Not on file  Occupational History  . Occupation: retired  Tobacco Use  . Smoking status: Former Smoker    Packs/day: 0.25    Years: 15.00    Pack years: 3.75    Types: Cigarettes    Quit date: 03/22/1998    Years since quitting: 22.8  . Smokeless tobacco: Never Used  Vaping Use  . Vaping Use: Never used  Substance and Sexual Activity  . Alcohol use: No  . Drug use: No  . Sexual activity: Not on file  Other Topics Concern  . Not on file  Social History Narrative   Married, lives with spouse, son and mother   2 children > son and daughter   OCCUPATION: retired Education officer, museum   Social Determinants of Radio broadcast assistant Strain: Not on Art therapist Insecurity: Not on file  Transportation Needs: Not on file  Physical Activity: Not on file  Stress: Not on file  Social Connections: Not on file     Review of Systems: A 12 point ROS discussed and pertinent positives are indicated in the HPI above.  All other systems are negative.   Vital Signs: BP 137/74 (BP Location: Left Leg)   Pulse 78   Temp (!) 97 F (36.1 C) (Axillary)   Resp 15   Ht 5' 1"  (1.549 m)   Wt 163 lb 9.3 oz (74.2 kg)   SpO2 100%   BMI 30.91 kg/m   Physical Examination   Constitutional:      Appearance: Appears fatigued. Cardiovascular:     Rate and Rhythm: Normal rate and regular rhythm.     Pulses: Normal pulses.     Heart sounds: Normal heart sounds.  Pulmonary:     Effort: Labored, patient was just coming off CPAP.     Breath sounds: Normal breath sounds.  Abdominal:     General: Bowel sounds are  decreased.    Palpations: Abdomen is soft.     Open wound at midline, colostomy bag on LLQ which he appears to be intact. Neurological:     Mental Status: Patient is alert and oriented to person and place.    MD Evaluation Airway: WNL Heart: WNL Abdomen: Other (comments) Abdomen comments: has open wound at midline, colostomy bag on LLQ Chest/ Lungs: Other (comments) Chest/ lungs comments: breathing labored, on [place] ASA  Classification: 3 Mallampati/Airway Score: Two  Imaging: CT ABDOMEN PELVIS WO CONTRAST  Result Date: 01/01/2021 CLINICAL DATA:  Respiratory failure, history of kidney transplant, recent abdominal surgery with drainage EXAM: CT CHEST, ABDOMEN AND PELVIS WITHOUT CONTRAST TECHNIQUE: Multidetector CT imaging of the chest, abdomen and pelvis was performed following the standard protocol without IV contrast. Unenhanced CT was performed per clinician order. Lack of IV contrast limits sensitivity and specificity, especially for evaluation of abdominal/pelvic solid viscera. COMPARISON:  05/14/2020, 12/29/2020 FINDINGS: CT CHEST FINDINGS Cardiovascular: Unenhanced imaging of the heart and great vessels demonstrates no pericardial effusion. Normal caliber of the thoracic aorta. Minimal atherosclerosis. Incidental note is made of prominent vascular structures in the left upper extremity consistent with previous AV fistula compatible with history of renal failure. Mediastinum/Nodes: No enlarged mediastinal, hilar, or axillary lymph nodes. Thyroid gland, trachea, and esophagus demonstrate no significant findings. Lungs/Pleura: There are small bilateral pleural effusions, right greater than left. Bilateral lower lobe atelectasis greatest in the right lower lobe. Patchy lingular consolidation may reflect hypoventilatory change versus early infection. No pneumothorax. Central airways are patent. Musculoskeletal: Prior healed bilateral rib fractures. No acute or destructive bony lesions. Reconstructed images demonstrate no additional findings. CT ABDOMEN PELVIS FINDINGS Hepatobiliary: Multiple hepatic cysts are again noted. Otherwise unremarkable unenhanced evaluation of the liver. Small gallstones are  identified within the gallbladder with no evidence of acute cholecystitis. Pancreas: Unremarkable. No pancreatic ductal dilatation or surrounding inflammatory changes. Spleen: Mild splenomegaly unchanged.  No focal abnormalities. Adrenals/Urinary  Tract: Numerous cysts are seen throughout the native kidneys, unchanged. Right lower quadrant renal transplant is unremarkable. No urinary tract calculi or obstructive uropathy. Bladder is unremarkable without filling defect. The adrenals are normal. Stomach/Bowel: No bowel obstruction or ileus. Diverting colostomy left lower quadrant is noted. Minimal diverticulosis of the remaining sigmoid colon. There is a fluid collection in the left lower quadrant measuring 3.3 x 2.8 cm on image 92/7, and extending approximately 5.3 cm in craniocaudal direction image 127/4. Fluid collection abuts the enterotomy staple line in the left lower quadrant, and could reflect postoperative seroma or abscess. Evaluation is limited without IV and oral contrast. No bowel wall thickening.  Normal appendix right lower quadrant. Vascular/Lymphatic: Aortic atherosclerosis. No enlarged abdominal or pelvic lymph nodes. Reproductive: Uterus and bilateral adnexa are unremarkable. Other: Postsurgical changes are seen from midline laparotomy, with residual surgical defect and packing. No subcutaneous fluid collection. There is a left inguinal hernia containing a small amount of fluid, though decreased since prior study. No evidence of bowel herniation. Aside from the left lower quadrant fluid collection described above, no additional areas of intra-abdominal fluid or free gas. Musculoskeletal: Postsurgical changes from left hip arthroplasty. There are no acute or destructive bony lesions. Reconstructed images demonstrate no additional findings. IMPRESSION: 1. Fluid collection left lower quadrant abutting the staple line of the sigmoid colon. Findings could reflect postoperative seroma or abscess.  Evaluation limited without IV and oral contrast. 2. Small bilateral pleural effusions and bilateral lower lobe atelectasis, right greater than left. 3. Patchy non dependent consolidation within the lingula, which could reflect hypoventilatory change or early infection. 4. Sequela of end-stage renal disease, with unremarkable transplant kidney right lower quadrant. 5. Postsurgical changes from midline laparotomy and left lower quadrant colostomy. Surgical defect from the midline laparotomy without fluid collection or abdominal wall abscess. 6. Stable splenomegaly. 7.  Aortic Atherosclerosis (ICD10-I70.0). Electronically Signed   By: Randa Ngo M.D.   On: 12/30/2020 15:14   CT ABDOMEN PELVIS WO CONTRAST  Result Date: 12/29/2020 CLINICAL DATA:  Increasing abdominal pain. Reason complication of diverticulitis. Renal transplant. EXAM: CT ABDOMEN AND PELVIS WITHOUT CONTRAST TECHNIQUE: Multidetector CT imaging of the abdomen and pelvis was performed following the standard protocol without IV contrast. COMPARISON:  CT of the abdomen and pelvis without contrast 12/16/2020. FINDINGS: Lower chest: Right greater than left pleural effusions are new. There is partial collapse of the right lower lobe. Mild dependent atelectasis is present bilaterally. The heart is enlarged. Coronary artery calcifications are present. Hepatobiliary: Multiple hepatic cysts are again seen. There is some fluid under the diaphragm. No new lesions are present. Gallbladder and common bile duct are within normal limits. Pancreas: Unremarkable. No pancreatic ductal dilatation or surrounding inflammatory changes. Spleen: Stable splenomegaly.  No discrete lesion. Adrenals/Urinary Tract: Bilateral renal cystic disease is stable. No new lesions are present. Right lower quadrant renal transplant is unremarkable. Urinary bladder is within normal limits. Stomach/Bowel: The stomach and duodenum are within normal limits. Small bowel is unremarkable. Terminal  ileum is within normal limits. The ascending and transverse colon are unremarkable. Descending colon is within normal limits. Persistent inflammatory changes are evident. New intraperitoneal air is present over the upper abdomen. A fluid collection is present anterior to the urinary bladder, adjacent to the inflamed bowel. The collection measures 4.1 x 2.9 x 2.8 cm. Vascular/Lymphatic: Atherosclerotic calcifications are present in the abdominal aorta and branch vessels without aneurysm. Reproductive: Uterus and bilateral adnexa are unremarkable. Other: Fluid extends into left inguinal hernia. The  paraumbilical hernia is again noted. There is gas within the hernia. Musculoskeletal: Advanced degenerative changes are present in the lumbar spine. Degenerative changes are present in the SI joints bilaterally. Left total hip arthroplasty noted. Remote left pubic symphysis fractures noted. IMPRESSION: 1. Pneumoperitoneum compatible with bowel perforation. 2. New fluid collection anterior to the urinary bladder, adjacent to the inflamed bowel. This is concerning for a developing abscess associated with the sigmoid diverticulitis. 3. Persistent inflammatory changes the sigmoid colon compatible with diverticulitis. 4. New bilateral pleural effusions, right greater than left. 5. Cardiomegaly without failure. 6. Coronary artery disease. 7. Stable splenomegaly. 8. Aortic Atherosclerosis (ICD10-I70.0). Electronically Signed   By: San Morelle M.D.   On: 12/29/2020 09:53   DG Elbow 2 Views Right  Result Date: 01/12/2021 CLINICAL DATA:  Right elbow pain. EXAM: RIGHT ELBOW - 2 VIEW COMPARISON:  None. FINDINGS: No gross fracture or dislocation on this portable study. Exam is limited by positioning and assessment for joint effusion is limited. No worrisome lytic or sclerotic osseous abnormality. IMPRESSION: Limited study due to positioning. No gross fracture or dislocation. Nondisplaced radial neck fracture a concern.  Repeat dedicated elbow films recommended when the patient is better able to participate with positioning. Electronically Signed   By: Misty Stanley M.D.   On: 01/12/2021 13:47   CT Chest Wo Contrast  Result Date: 12/30/2020 CLINICAL DATA:  Respiratory failure, history of kidney transplant, recent abdominal surgery with drainage EXAM: CT CHEST, ABDOMEN AND PELVIS WITHOUT CONTRAST TECHNIQUE: Multidetector CT imaging of the chest, abdomen and pelvis was performed following the standard protocol without IV contrast. Unenhanced CT was performed per clinician order. Lack of IV contrast limits sensitivity and specificity, especially for evaluation of abdominal/pelvic solid viscera. COMPARISON:  05/14/2020, 12/29/2020 FINDINGS: CT CHEST FINDINGS Cardiovascular: Unenhanced imaging of the heart and great vessels demonstrates no pericardial effusion. Normal caliber of the thoracic aorta. Minimal atherosclerosis. Incidental note is made of prominent vascular structures in the left upper extremity consistent with previous AV fistula compatible with history of renal failure. Mediastinum/Nodes: No enlarged mediastinal, hilar, or axillary lymph nodes. Thyroid gland, trachea, and esophagus demonstrate no significant findings. Lungs/Pleura: There are small bilateral pleural effusions, right greater than left. Bilateral lower lobe atelectasis greatest in the right lower lobe. Patchy lingular consolidation may reflect hypoventilatory change versus early infection. No pneumothorax. Central airways are patent. Musculoskeletal: Prior healed bilateral rib fractures. No acute or destructive bony lesions. Reconstructed images demonstrate no additional findings. CT ABDOMEN PELVIS FINDINGS Hepatobiliary: Multiple hepatic cysts are again noted. Otherwise unremarkable unenhanced evaluation of the liver. Small gallstones are identified within the gallbladder with no evidence of acute cholecystitis. Pancreas: Unremarkable. No pancreatic ductal  dilatation or surrounding inflammatory changes. Spleen: Mild splenomegaly unchanged.  No focal abnormalities. Adrenals/Urinary Tract: Numerous cysts are seen throughout the native kidneys, unchanged. Right lower quadrant renal transplant is unremarkable. No urinary tract calculi or obstructive uropathy. Bladder is unremarkable without filling defect. The adrenals are normal. Stomach/Bowel: No bowel obstruction or ileus. Diverting colostomy left lower quadrant is noted. Minimal diverticulosis of the remaining sigmoid colon. There is a fluid collection in the left lower quadrant measuring 3.3 x 2.8 cm on image 92/7, and extending approximately 5.3 cm in craniocaudal direction image 127/4. Fluid collection abuts the enterotomy staple line in the left lower quadrant, and could reflect postoperative seroma or abscess. Evaluation is limited without IV and oral contrast. No bowel wall thickening.  Normal appendix right lower quadrant. Vascular/Lymphatic: Aortic atherosclerosis. No enlarged abdominal or  pelvic lymph nodes. Reproductive: Uterus and bilateral adnexa are unremarkable. Other: Postsurgical changes are seen from midline laparotomy, with residual surgical defect and packing. No subcutaneous fluid collection. There is a left inguinal hernia containing a small amount of fluid, though decreased since prior study. No evidence of bowel herniation. Aside from the left lower quadrant fluid collection described above, no additional areas of intra-abdominal fluid or free gas. Musculoskeletal: Postsurgical changes from left hip arthroplasty. There are no acute or destructive bony lesions. Reconstructed images demonstrate no additional findings. IMPRESSION: 1. Fluid collection left lower quadrant abutting the staple line of the sigmoid colon. Findings could reflect postoperative seroma or abscess. Evaluation limited without IV and oral contrast. 2. Small bilateral pleural effusions and bilateral lower lobe atelectasis, right  greater than left. 3. Patchy non dependent consolidation within the lingula, which could reflect hypoventilatory change or early infection. 4. Sequela of end-stage renal disease, with unremarkable transplant kidney right lower quadrant. 5. Postsurgical changes from midline laparotomy and left lower quadrant colostomy. Surgical defect from the midline laparotomy without fluid collection or abdominal wall abscess. 6. Stable splenomegaly. 7.  Aortic Atherosclerosis (ICD10-I70.0). Electronically Signed   By: Randa Ngo M.D.   On: 01/13/2021 15:14   DG Chest Portable 1 View  Result Date: 01/19/2021 CLINICAL DATA:  Hypoxia EXAM: PORTABLE CHEST 1 VIEW COMPARISON:  01/01/2021 FINDINGS: Central line is been removed since the prior study. Improved aeration in the bases with residual mild bibasilar airspace disease likely atelectasis. Negative for edema or effusion. IMPRESSION: Mild bibasilar atelectasis Electronically Signed   By: Franchot Gallo M.D.   On: 01/13/2021 14:04   DG CHEST PORT 1 VIEW  Result Date: 01/01/2021 CLINICAL DATA:  65 year old female with respiratory failure. Renal transplant. Recent suspected bowel perforation on CT Abdomen and Pelvis. EXAM: PORTABLE CHEST 1 VIEW COMPARISON:  Portable chest 12/31/2020 and earlier. FINDINGS: Portable AP semi upright view at 0453 hours. Extubated. Enteric tube removed. Stable left IJ central line. The patient is slightly more rotated to the right. Stable cardiac size and mediastinal contours. Veiling bilateral pulmonary opacity, suspicious for increased pleural effusions since the CT on 12/29/2020. Associated dense lung base opacity. No pneumothorax. Upper lung pulmonary vascularity appears normal. Paucity of bowel gas in the upper abdomen. IMPRESSION: 1. Extubated and enteric tube removed. Stable left IJ central line. 2. Veiling and confluent bilateral lung base opacity suspicious for increased pleural effusions and lower lobe collapse or consolidation since  the abdomen CT on 12/29/2020. 3. No overt pulmonary edema. Electronically Signed   By: Genevie Ann M.D.   On: 01/01/2021 08:02   DG Chest Port 1 View  Result Date: 12/31/2020 CLINICAL DATA:  Respiratory failure. EXAM: PORTABLE CHEST 1 VIEW COMPARISON:  12/29/2020 FINDINGS: u 0451 hours. Endotracheal tube tip is 4.4 cm above the base of the carina. The NG tube passes into the stomach although the distal tip position is not included on the film. Left IJ central line tip overlies the innominate vein confluence, stable. The cardio pericardial silhouette is enlarged. There is pulmonary vascular congestion without overt pulmonary edema. Bibasilar atelectasis/infiltrate again noted with probable small bilateral pleural effusions. IMPRESSION: Slight progression of bibasilar atelectasis/infiltrate with small layering pleural effusions visible on the current study. Cardiomegaly with vascular congestion. Electronically Signed   By: Misty Stanley M.D.   On: 12/31/2020 07:40   DG CHEST PORT 1 VIEW  Result Date: 12/29/2020 CLINICAL DATA:  LEFT central line placement. EXAM: PORTABLE CHEST 1 VIEW COMPARISON:  December 25, 2020. FINDINGS: Endotracheal tube placed, tip between clavicular heads approximately 3 cm above the carina. LEFT-sided IJ central venous catheter terminates at the proximal portion of the SVC. Gastric tube courses through in off the field of the radiograph. Leads project over the patient's chest anteriorly. Cardiomediastinal contours stable enlarged. Graded opacity in the RIGHT chest persists with central pulmonary vascular congestion. On limited assessment there is no acute skeletal process. IMPRESSION: 1. Interval placement of LEFT IJ central venous catheter with tip at the proximal portion of the SVC. No pneumothorax. 2. Persistent RIGHT-sided fusion extending into the major fissure. 3. Cardiomegaly with potential small LEFT effusion as well. 4. Basilar atelectasis. Electronically Signed   By: Zetta Bills  M.D.   On: 12/29/2020 15:32   DG Chest Port 1 View  Result Date: 12/25/2020 CLINICAL DATA:  Abnormal respirations. EXAM: PORTABLE CHEST 1 VIEW COMPARISON:  Chest x-ray 12/18/2020 FINDINGS: The heart size and mediastinal contours are unchanged. Redemonstration of prominent hilar vasculature. Left base linear atelectasis. No focal consolidation within the aerated lungs. No pulmonary edema. Interval increase in trace to small volume right pleural effusion. Persistent trace left pleural effusion. No pneumothorax. No acute osseous abnormality. IMPRESSION: 1. Slight interval increase of a right trace to small volume pleural effusion. 2. Grossly stable left trace pleural effusion. 3. Vascular congestion. Electronically Signed   By: Iven Finn M.D.   On: 12/25/2020 05:48   VAS [place] ABI WITH/WO TBI  Result Date: 01/04/2021 LOWER EXTREMITY DOPPLER STUDY Indications: Dusky Toes. High Risk Factors: Hypertension, hyperlipidemia.  Limitations: Today's exam was limited due to Left restricted arm. Comparison Study: No prior studies. Performing Technologist: Carlos Levering RVT  Examination Guidelines: A complete evaluation includes at minimum, Doppler waveform signals and systolic blood pressure reading at the level of bilateral brachial, anterior tibial, and posterior tibial arteries, when vessel segments are accessible. Bilateral testing is considered an integral part of a complete examination. Photoelectric Plethysmograph (PPG) waveforms and toe systolic pressure readings are included as required and additional duplex testing as needed. Limited examinations for reoccurring indications may be performed as noted.  ABI Findings: +---------+------------------+-----+---------+--------+ Right    Rt Pressure (mmHg)IndexWaveform Comment  +---------+------------------+-----+---------+--------+ Brachial 141                    triphasic         +---------+------------------+-----+---------+--------+ PTA      203                1.44 triphasic         +---------+------------------+-----+---------+--------+ DP       153               1.09 triphasic         +---------+------------------+-----+---------+--------+ Great Toe0                 0.00                   +---------+------------------+-----+---------+--------+ +---------+------------------+-----+---------+--------------+ Left     Lt Pressure (mmHg)IndexWaveform Comment        +---------+------------------+-----+---------+--------------+ Brachial                                 Restricted arm +---------+------------------+-----+---------+--------------+ PTA      194               1.38 triphasic               +---------+------------------+-----+---------+--------------+  DP       147               1.04 triphasic               +---------+------------------+-----+---------+--------------+ Great Toe0                 0.00                         +---------+------------------+-----+---------+--------------+ +-------+-----------+-----------+------------+------------+ ABI/TBIToday's ABIToday's TBIPrevious ABIPrevious TBI +-------+-----------+-----------+------------+------------+ Right  1.44       0                                   +-------+-----------+-----------+------------+------------+ Left   1.38       0                                   +-------+-----------+-----------+------------+------------+  Summary: Right: Resting right ankle-brachial index indicates noncompressible right lower extremity arteries. The right toe-brachial index is abnormal. Left: Resting left ankle-brachial index indicates noncompressible left lower extremity arteries. The left toe-brachial index is abnormal.  *See table(s) above for measurements and observations.  Electronically signed by Harold Barban MD on 01/04/2021 at 8:00:58 PM.    Final    ECHO TEE  Result Date: 12/25/2020    TRANSESOPHOGEAL ECHO REPORT   Patient Name:   April [name] Furuya  Date of Exam: 12/25/2020 Medical Rec #:  [id number]         Height:       61.0 in Accession #:    [id number]        Weight:       173.1 lb Date of Birth:  08-30-1956         BSA:          1.776 m Patient Age:    65 years          BP:           127/89 mmHg Patient Gender: F                 HR:           116 bpm. Exam Location:  Inpatient Procedure: 3D Echo, Transesophageal Echo, Cardiac Doppler and Color Doppler Indications:     I48.0 Paroxysmal atrial fibrillation  History:         Patient has prior history of Echocardiogram examinations, most                  recent 12/17/2020. CHF, COPD; Risk Factors:Hypertension,                  Dyslipidemia and GERD.  Sonographer:     Tiffany Dance Referring Phys:  [id number] Abigail Butts Diagnosing Phys: Eleonore Chiquito MD PROCEDURE: After discussion of the risks and benefits of a TEE, an informed consent was obtained from the patient. TEE procedure time was 18 minutes. The transesophogeal probe was passed without difficulty through the esophogus of the patient. Imaged were obtained with the patient in a left lateral decubitus position. Local oropharyngeal anesthetic was provided with Cetacaine. Sedation performed by different physician. The patient was monitored while under deep sedation. Anesthestetic sedation was provided intravenously by Anesthesiology: 129m of Propofol. Image quality was excellent. The patient's vital signs; including heart rate, blood pressure, and oxygen saturation;  remained stable throughout the procedure. The patient developed no complications during the procedure. A successful direct current cardioversion was performed at 120 joules with 1 attempt. IMPRESSIONS  1. Atrial flutter. No LA/LAA thrombus. Successful DCCV x 1 with 120 J and return to NSR.  2. Left ventricular ejection fraction, by estimation, is 55 to 60%. The left ventricle has normal function.  3. Right ventricular systolic function is normal. The right ventricular size is normal.  4. No  left atrial/left atrial appendage thrombus was detected. The LAA emptying velocity was 75 cm/s.  5. The mitral valve is normal in structure. Mild mitral valve regurgitation. No evidence of mitral stenosis.  6. Tricuspid valve regurgitation is mild to moderate.  7. The aortic valve is tricuspid. Aortic valve regurgitation is not visualized. No aortic stenosis is present.  8. There is mild (Grade II) layered plaque involving the descending aorta. FINDINGS  Left Ventricle: Left ventricular ejection fraction, by estimation, is 55 to 60%. The left ventricle has normal function. The left ventricular internal cavity size was normal in size. Right Ventricle: The right ventricular size is normal. No increase in right ventricular wall thickness. Right ventricular systolic function is normal. Left Atrium: Left atrial size was normal in size. No left atrial/left atrial appendage thrombus was detected. The LAA emptying velocity was 75 cm/s. Right Atrium: Right atrial size was normal in size. Pericardium: There is no evidence of pericardial effusion. Presence of pericardial fat pad. Mitral Valve: The mitral valve is normal in structure. Mild mitral valve regurgitation. No evidence of mitral valve stenosis. Tricuspid Valve: The tricuspid valve is normal in structure. Tricuspid valve regurgitation is mild to moderate. No evidence of tricuspid stenosis. Aortic Valve: The aortic valve is tricuspid. Aortic valve regurgitation is not visualized. No aortic stenosis is present. Pulmonic Valve: The pulmonic valve was grossly normal. Pulmonic valve regurgitation is not visualized. No evidence of pulmonic stenosis. Aorta: The aortic root and ascending aorta are structurally normal, with no evidence of dilitation. There is mild (Grade II) layered plaque involving the descending aorta. Venous: The left upper pulmonary vein, right upper pulmonary vein, left lower pulmonary vein and right lower pulmonary vein are normal. IAS/Shunts: The atrial  septum is grossly normal.   AORTA Ao Root diam: 3.68 cm Ao Asc diam:  3.47 cm TRICUSPID VALVE TR Peak grad:   35.0 mmHg TR Vmax:        296.00 cm/s Eleonore Chiquito MD Electronically signed by Eleonore Chiquito MD Signature Date/Time: 12/25/2020/3:31:25 PM    Final    [place] EKG SITE RITE  Result Date: 12/23/2020 If Site Rite image not attached, placement could not be confirmed due to current cardiac rhythm.   Labs:  CBC: Recent Labs    01/13/21 0049 01/14/21 0520 01/08/2021 1339 01/21/21 0239  WBC 2.6* 3.2* 6.8 3.5*  HGB 8.8* 8.9* 8.5* 7.3*  HCT 27.9* 27.5* 27.1* 24.1*  PLT 139* 180 199 141*    COAGS: Recent Labs    07/01/20 2334 09/04/20 1417 09/26/20 0547 12/29/20 1630 01/09/21 1113 01/01/2021 1339 01/21/21 0239  INR 1.2 1.5*   < > 1.8* 1.2 2.8* 3.1*  APTT 31 37*  --   --  29  --  133*   < > = values in this interval not displayed.    BMP: Recent Labs    07/01/20 2334 07/02/20 0030 07/03/20 0350 07/18/20 1545 08/05/20 1523 09/02/20 2049 01/12/21 0250 01/13/21 0049 01/07/2021 1339 01/21/21 0239  NA 135   < > 139  131* 140   < > 131* 132* 126* 129*  K 4.4   < > 5.1 4.7 4.5   < > 3.9 4.1 4.7 5.0  CL 102   < > 108 95* 106   < > 96* 98 96* 101  CO2 25  --  20* 24 27   < > 28 25 20* 18*  GLUCOSE 135*   < > 187* 187* 189*   < > 189* 258* 75 121*  BUN 28*   < > 42* 30* 45*   < > 54* 48* 55* 51*  CALCIUM 9.7  --  9.0 9.4 10.1   < > 8.7* 8.8* 8.4* 8.2*  CREATININE 1.02*   < > 1.17* 1.39* 1.30*   < > 1.03* 0.97 2.19* 2.09*  GFRNONAA 58*  --  50* 40* 44*   < > >60 >60 25* 26*  GFRAA >60  --  57* 47* 51*  --   --   --   --   --    < > = values in this interval not displayed.    LIVER FUNCTION TESTS: Recent Labs    01/02/21 0256 01/03/21 0300 01/04/21 0556 01/08/21 1021 01/09/21 0511 01/11/21 0023 01/12/21 0250 01/13/21 0049 12/27/2020 1339  BILITOT 1.6* 1.4*  --  1.5*  --   --   --   --  1.2  AST 12* 11*  --  10*  --   --   --   --  25  ALT 11 11  --  8  --   --   --   --   8  ALKPHOS 48 53  --  62  --   --   --   --  94  PROT 5.4* 5.6*  --  5.4*  --   --   --   --  6.0*  ALBUMIN 2.3*  2.3* 2.3*   < > 2.3*   < > 2.1* 1.9* 2.0* 1.9*   < > = values in this interval not displayed.    TUMOR MARKERS: No results for input(s): AFPTM, CEA, CA199, CHROMGRNA in the last 8760 hours.  Assessment and Plan: 66 y.o. female with extensive met past medical history including recent hospitalization with perforated diverticulitis and septic shock who underwent ex lap and colostomy on 12/29/20.  After being discharged to SNF on 01/14/2021, unfortunately patient came back to [facility] ED after found nonresponsive at [facility]. CT CAP without contrast in ED revealed post op seroma VS developing abscess.   IR was consulted for image guided aspiration VS drain placement for intra-abdominal fluid collection by CCS.   Patient's daughter and nurse state that patient's oral intake has been extremely poor since yesterday due to her mental status.  Patient made n.p.o. Patient was on Eliquis prior to admission per cardiology due to s/p DCCV on 12/25/2020. Patient on heparin infusion.  INR 3.1 this morning and trending up.   Per Dr. Anselm Pancoast, the procedure might not be in patient's best interest at this moment due to her high bleeding risk, and the procedure unlikely will change the patient's clinical outcome due to the size of the intra-abdominal fluid collection. Will follow up with general surgery, and IR will attempt aspiration of the intra-abdominal fluid collection if it needs to be removed urgently.  Tentatively scheduled for this afternoon in CT.  As patient is not fully capable of making her own medical decisions at the moment, the decision was made by patient's daughter.  Risks and benefits discussed with the patient including bleeding, infection, damage to adjacent structures, bowel perforation/fistula connection, and sepsis.  All of the daugther's questions were answered, she is agreeable  to proceed. Consent signed and in chart.  Thank you for this interesting consult.  I greatly enjoyed meeting CONLEIGH HEINLEIN and look forward to participating in their care.  A copy of this report was sent to the requesting provider on this date.  Electronically Signed: Tera Mater, PA-C 01/21/2021, 10:15 AM   I spent a total of 40 Minutes   in face to face in clinical consultation, greater than 50% of which was counseling/coordinating care for image guided aspiration vs drain placement for intra-abdominal fluid collection

## 2021-01-21 DEATH — deceased

## 2021-01-22 DIAGNOSIS — I9589 Other hypotension: Secondary | ICD-10-CM

## 2021-01-22 DIAGNOSIS — I5033 Acute on chronic diastolic (congestive) heart failure: Secondary | ICD-10-CM | POA: Diagnosis not present

## 2021-01-22 DIAGNOSIS — Z940 Kidney transplant status: Secondary | ICD-10-CM

## 2021-01-22 DIAGNOSIS — N179 Acute kidney failure, unspecified: Secondary | ICD-10-CM | POA: Diagnosis not present

## 2021-01-22 DIAGNOSIS — E861 Hypovolemia: Secondary | ICD-10-CM

## 2021-01-22 DIAGNOSIS — G9341 Metabolic encephalopathy: Secondary | ICD-10-CM | POA: Diagnosis not present

## 2021-01-22 DIAGNOSIS — J9601 Acute respiratory failure with hypoxia: Secondary | ICD-10-CM

## 2021-01-22 LAB — COMPREHENSIVE METABOLIC PANEL
ALT: 9 U/L (ref 0–44)
AST: 20 U/L (ref 15–41)
Albumin: 2.1 g/dL — ABNORMAL LOW (ref 3.5–5.0)
Alkaline Phosphatase: 80 U/L (ref 38–126)
Anion gap: 10 (ref 5–15)
BUN: 53 mg/dL — ABNORMAL HIGH (ref 8–23)
CO2: 17 mmol/L — ABNORMAL LOW (ref 22–32)
Calcium: 8.2 mg/dL — ABNORMAL LOW (ref 8.9–10.3)
Chloride: 101 mmol/L (ref 98–111)
Creatinine, Ser: 2.1 mg/dL — ABNORMAL HIGH (ref 0.44–1.00)
GFR, Estimated: 26 mL/min — ABNORMAL LOW (ref >60)
Glucose, Bld: 200 mg/dL — ABNORMAL HIGH (ref 70–99)
Potassium: 4.3 mmol/L (ref 3.5–5.1)
Sodium: 128 mmol/L — ABNORMAL LOW (ref 135–145)
Total Bilirubin: 1.1 mg/dL (ref 0.3–1.2)
Total Protein: 5.7 g/dL — ABNORMAL LOW (ref 6.5–8.1)

## 2021-01-22 LAB — CBC WITH DIFFERENTIAL/PLATELET
Abs Immature Granulocytes: 0.4 10*3/uL — ABNORMAL HIGH (ref 0.00–0.07)
Basophils Absolute: 0 10*3/uL (ref 0.0–0.1)
Basophils Relative: 0 %
Eosinophils Absolute: 0 10*3/uL (ref 0.0–0.5)
Eosinophils Relative: 0 %
HCT: 23.7 % — ABNORMAL LOW (ref 36.0–46.0)
Hemoglobin: 7.4 g/dL — ABNORMAL LOW (ref 12.0–15.0)
Immature Granulocytes: 9 %
Lymphocytes Relative: 9 %
Lymphs Abs: 0.4 10*3/uL — ABNORMAL LOW (ref 0.7–4.0)
MCH: 31.2 pg (ref 26.0–34.0)
MCHC: 31.2 g/dL (ref 30.0–36.0)
MCV: 100 fL (ref 80.0–100.0)
Monocytes Absolute: 0.1 10*3/uL (ref 0.1–1.0)
Monocytes Relative: 3 %
Neutro Abs: 3.5 10*3/uL (ref 1.7–7.7)
Neutrophils Relative %: 79 %
Platelets: 161 10*3/uL (ref 150–400)
RBC: 2.37 MIL/uL — ABNORMAL LOW (ref 3.87–5.11)
RDW: 19.4 % — ABNORMAL HIGH (ref 11.5–15.5)
WBC: 4.4 10*3/uL (ref 4.0–10.5)
nRBC: 0 % (ref 0.0–0.2)

## 2021-01-22 LAB — GLUCOSE, CAPILLARY
Glucose-Capillary: 189 mg/dL — ABNORMAL HIGH (ref 70–99)
Glucose-Capillary: 169 mg/dL — ABNORMAL HIGH (ref 70–99)
Glucose-Capillary: 169 mg/dL — ABNORMAL HIGH (ref 70–99)
Glucose-Capillary: 177 mg/dL — ABNORMAL HIGH (ref 70–99)

## 2021-01-22 LAB — PROTIME-INR
INR: 2.2 — ABNORMAL HIGH (ref 0.8–1.2)
Prothrombin Time: 23.5 seconds — ABNORMAL HIGH (ref 11.4–15.2)

## 2021-01-22 LAB — URINE CULTURE: Culture: NO GROWTH

## 2021-01-22 LAB — HEPARIN LEVEL (UNFRACTIONATED): Heparin Unfractionated: >2.2 IU/mL — ABNORMAL HIGH (ref 0.30–0.70)

## 2021-01-22 LAB — MAGNESIUM: Magnesium: 1.8 mg/dL (ref 1.7–2.4)

## 2021-01-22 LAB — APTT: aPTT: 68 seconds — ABNORMAL HIGH (ref 24–36)

## 2021-01-22 NOTE — TOC Progression Note (Signed)
Transition of Care [facility]) - Progression Note    Patient Details  Name: April [name] MRN: [id number] Date of Birth: 1956/07/11  Transition of Care [facility] CM/SW Contact  Leeroy Cha, RN Phone Number: 01/22/2021, 7:26 AM  Clinical Narrative:    Palliative care note: Patient and family wants aggressive treatment at this time' Hospice and [place] services outpatient were explained and offered. Patient and family verbalized their understanding and awareness of both palliative and hospice's goals and philosophy of care. Daughter expressed family is not interested in hospice discussions at this time but is open to outpatient Palliative support. This was also recommended at discharge during previous hospitalization however daughter does not think this occurred.  PLAN: will follow for possible home hospice and toc needs. Expected Discharge Plan: Skilled Nursing Facility Barriers to Discharge: Continued Medical Work up  Expected Discharge Plan and Services Expected Discharge Plan: [place]   Discharge Planning Services: CM Consult Post Acute Care Choice: [place] Living arrangements for the past 2 months: Single Family Home                                       Social Determinants of Health (SDOH) Interventions    Readmission Risk Interventions Readmission Risk Prevention Plan 05/15/2020 04/19/2020 02/09/2020  Transportation Screening Complete Complete Complete  PCP or Specialist Appt within 3-5 Days - - -  Not Complete comments - - -  HRI or [place] or Home Care Consult comments - - -  Social Work Consult for [place] Planning/Counseling - - -  SW consult not completed comments - - -  Palliative Care Screening - - -  Medication Review ([place]) Referral to Pharmacy Complete Referral to Pharmacy  PCP or Specialist appointment within 3-5 days of discharge Complete Complete Complete  HRI or Home  Care Consult Complete Complete Complete  SW Recovery Care/Counseling Consult Complete Complete -  Palliative Care Screening Not Applicable Not Applicable Complete  Skilled Nursing Facility Complete Complete Patient Refused  Some recent data might be hidden

## 2021-01-22 NOTE — Progress Notes (Signed)
  INR 2.2 today.  Need INR closer to 1.5 before proceeding with drain placement.  IR will continue to follow labs and proceed when INR is within acceptable limit.  Zahid Carneiro S Charvez [place] PA-C 01/22/2021 10:08 AM

## 2021-01-22 NOTE — Progress Notes (Signed)
Subjective/Chief Complaint: Awake alert feels better    Objective: Vital signs in last 24 hours: Temp:  [97.2 F (36.2 C)-98.1 F (36.7 C)] 97.2 F (36.2 C) (03/02 0800) Pulse Rate:  [77-84] 79 (03/02 0800) Resp:  [14-26] 15 (03/02 0800) BP: (114-150)/(48-88) 125/66 (03/02 0800) SpO2:  [100 %] 100 % (03/02 0800) Weight:  [73.3 kg] 73.3 kg (03/02 0500) Last BM Date: 01/21/21  Intake/Output from previous day: 03/01 0701 - 03/02 0700 In: 523.5 [I.V.:128.9; IV Piggyback:394.6] Out: 600 [Urine:500; Stool:100] Intake/Output this shift: No intake/output data recorded.   General appearance: alert, cooperative much more comfortable.   Resp: On CPAP, but much more alert and answering questions this AM Cardio: regular rate and rhythm GI: soft, wound looks clean this AM, not much draiange on the dressing.  ostomy working pink viable .   Lab Results:  Recent Labs    01/21/21 0239 01/22/21 0258  WBC 3.5* 4.4  HGB 7.3* 7.4*  HCT 24.1* 23.7*  PLT 141* 161   BMET Recent Labs    01/21/21 0239 01/22/21 0258  NA 129* 128*  K 5.0 4.3  CL 101 101  CO2 18* 17*  GLUCOSE 121* 200*  BUN 51* 53*  CREATININE 2.09* 2.10*  CALCIUM 8.2* 8.2*   PT/INR Recent Labs    01/21/21 0239 01/22/21 0258  LABPROT 30.8* 23.5*  INR 3.1* 2.2*   ABG Recent Labs    01/02/2021 1737  PHART 7.343*  HCO3 19.6*    Studies/Results: CT ABDOMEN PELVIS WO CONTRAST  Result Date: 01/12/2021 CLINICAL DATA:  Respiratory failure, history of kidney transplant, recent abdominal surgery with drainage EXAM: CT CHEST, ABDOMEN AND PELVIS WITHOUT CONTRAST TECHNIQUE: Multidetector CT imaging of the chest, abdomen and pelvis was performed following the standard protocol without IV contrast. Unenhanced CT was performed per clinician order. Lack of IV contrast limits sensitivity and specificity, especially for evaluation of abdominal/pelvic solid viscera. COMPARISON:  05/14/2020, 12/29/2020 FINDINGS: CT CHEST  FINDINGS Cardiovascular: Unenhanced imaging of the heart and great vessels demonstrates no pericardial effusion. Normal caliber of the thoracic aorta. Minimal atherosclerosis. Incidental note is made of prominent vascular structures in the left upper extremity consistent with previous AV fistula compatible with history of renal failure. Mediastinum/Nodes: No enlarged mediastinal, hilar, or axillary lymph nodes. Thyroid gland, trachea, and esophagus demonstrate no significant findings. Lungs/Pleura: There are small bilateral pleural effusions, right greater than left. Bilateral lower lobe atelectasis greatest in the right lower lobe. Patchy lingular consolidation may reflect hypoventilatory change versus early infection. No pneumothorax. Central airways are patent. Musculoskeletal: Prior healed bilateral rib fractures. No acute or destructive bony lesions. Reconstructed images demonstrate no additional findings. CT ABDOMEN PELVIS FINDINGS Hepatobiliary: Multiple hepatic cysts are again noted. Otherwise unremarkable unenhanced evaluation of the liver. Small gallstones are identified within the gallbladder with no evidence of acute cholecystitis. Pancreas: Unremarkable. No pancreatic ductal dilatation or surrounding inflammatory changes. Spleen: Mild splenomegaly unchanged.  No focal abnormalities. Adrenals/Urinary Tract: Numerous cysts are seen throughout the native kidneys, unchanged. Right lower quadrant renal transplant is unremarkable. No urinary tract calculi or obstructive uropathy. Bladder is unremarkable without filling defect. The adrenals are normal. Stomach/Bowel: No bowel obstruction or ileus. Diverting colostomy left lower quadrant is noted. Minimal diverticulosis of the remaining sigmoid colon. There is a fluid collection in the left lower quadrant measuring 3.3 x 2.8 cm on image 92/7, and extending approximately 5.3 cm in craniocaudal direction image 127/4. Fluid collection abuts the enterotomy staple  line in the left lower  quadrant, and could reflect postoperative seroma or abscess. Evaluation is limited without IV and oral contrast. No bowel wall thickening.  Normal appendix right lower quadrant. Vascular/Lymphatic: Aortic atherosclerosis. No enlarged abdominal or pelvic lymph nodes. Reproductive: Uterus and bilateral adnexa are unremarkable. Other: Postsurgical changes are seen from midline laparotomy, with residual surgical defect and packing. No subcutaneous fluid collection. There is a left inguinal hernia containing a small amount of fluid, though decreased since prior study. No evidence of bowel herniation. Aside from the left lower quadrant fluid collection described above, no additional areas of intra-abdominal fluid or free gas. Musculoskeletal: Postsurgical changes from left hip arthroplasty. There are no acute or destructive bony lesions. Reconstructed images demonstrate no additional findings. IMPRESSION: 1. Fluid collection left lower quadrant abutting the staple line of the sigmoid colon. Findings could reflect postoperative seroma or abscess. Evaluation limited without IV and oral contrast. 2. Small bilateral pleural effusions and bilateral lower lobe atelectasis, right greater than left. 3. Patchy non dependent consolidation within the lingula, which could reflect hypoventilatory change or early infection. 4. Sequela of end-stage renal disease, with unremarkable transplant kidney right lower quadrant. 5. Postsurgical changes from midline laparotomy and left lower quadrant colostomy. Surgical defect from the midline laparotomy without fluid collection or abdominal wall abscess. 6. Stable splenomegaly. 7.  Aortic Atherosclerosis (ICD10-I70.0). Electronically Signed   By: Randa Ngo M.D.   On: 01/04/2021 15:14   CT Chest Wo Contrast  Result Date: 01/11/2021 CLINICAL DATA:  Respiratory failure, history of kidney transplant, recent abdominal surgery with drainage EXAM: CT CHEST, ABDOMEN AND  PELVIS WITHOUT CONTRAST TECHNIQUE: Multidetector CT imaging of the chest, abdomen and pelvis was performed following the standard protocol without IV contrast. Unenhanced CT was performed per clinician order. Lack of IV contrast limits sensitivity and specificity, especially for evaluation of abdominal/pelvic solid viscera. COMPARISON:  05/14/2020, 12/29/2020 FINDINGS: CT CHEST FINDINGS Cardiovascular: Unenhanced imaging of the heart and great vessels demonstrates no pericardial effusion. Normal caliber of the thoracic aorta. Minimal atherosclerosis. Incidental note is made of prominent vascular structures in the left upper extremity consistent with previous AV fistula compatible with history of renal failure. Mediastinum/Nodes: No enlarged mediastinal, hilar, or axillary lymph nodes. Thyroid gland, trachea, and esophagus demonstrate no significant findings. Lungs/Pleura: There are small bilateral pleural effusions, right greater than left. Bilateral lower lobe atelectasis greatest in the right lower lobe. Patchy lingular consolidation may reflect hypoventilatory change versus early infection. No pneumothorax. Central airways are patent. Musculoskeletal: Prior healed bilateral rib fractures. No acute or destructive bony lesions. Reconstructed images demonstrate no additional findings. CT ABDOMEN PELVIS FINDINGS Hepatobiliary: Multiple hepatic cysts are again noted. Otherwise unremarkable unenhanced evaluation of the liver. Small gallstones are identified within the gallbladder with no evidence of acute cholecystitis. Pancreas: Unremarkable. No pancreatic ductal dilatation or surrounding inflammatory changes. Spleen: Mild splenomegaly unchanged.  No focal abnormalities. Adrenals/Urinary Tract: Numerous cysts are seen throughout the native kidneys, unchanged. Right lower quadrant renal transplant is unremarkable. No urinary tract calculi or obstructive uropathy. Bladder is unremarkable without filling defect. The  adrenals are normal. Stomach/Bowel: No bowel obstruction or ileus. Diverting colostomy left lower quadrant is noted. Minimal diverticulosis of the remaining sigmoid colon. There is a fluid collection in the left lower quadrant measuring 3.3 x 2.8 cm on image 92/7, and extending approximately 5.3 cm in craniocaudal direction image 127/4. Fluid collection abuts the enterotomy staple line in the left lower quadrant, and could reflect postoperative seroma or abscess. Evaluation is limited without IV and oral  contrast. No bowel wall thickening.  Normal appendix right lower quadrant. Vascular/Lymphatic: Aortic atherosclerosis. No enlarged abdominal or pelvic lymph nodes. Reproductive: Uterus and bilateral adnexa are unremarkable. Other: Postsurgical changes are seen from midline laparotomy, with residual surgical defect and packing. No subcutaneous fluid collection. There is a left inguinal hernia containing a small amount of fluid, though decreased since prior study. No evidence of bowel herniation. Aside from the left lower quadrant fluid collection described above, no additional areas of intra-abdominal fluid or free gas. Musculoskeletal: Postsurgical changes from left hip arthroplasty. There are no acute or destructive bony lesions. Reconstructed images demonstrate no additional findings. IMPRESSION: 1. Fluid collection left lower quadrant abutting the staple line of the sigmoid colon. Findings could reflect postoperative seroma or abscess. Evaluation limited without IV and oral contrast. 2. Small bilateral pleural effusions and bilateral lower lobe atelectasis, right greater than left. 3. Patchy non dependent consolidation within the lingula, which could reflect hypoventilatory change or early infection. 4. Sequela of end-stage renal disease, with unremarkable transplant kidney right lower quadrant. 5. Postsurgical changes from midline laparotomy and left lower quadrant colostomy. Surgical defect from the midline  laparotomy without fluid collection or abdominal wall abscess. 6. Stable splenomegaly. 7.  Aortic Atherosclerosis (ICD10-I70.0). Electronically Signed   By: Randa Ngo M.D.   On: 12/26/2020 15:14   DG Chest Portable 1 View  Result Date: 01/04/2021 CLINICAL DATA:  Hypoxia EXAM: PORTABLE CHEST 1 VIEW COMPARISON:  01/01/2021 FINDINGS: Central line is been removed since the prior study. Improved aeration in the bases with residual mild bibasilar airspace disease likely atelectasis. Negative for edema or effusion. IMPRESSION: Mild bibasilar atelectasis Electronically Signed   By: Franchot Gallo M.D.   On: 01/13/2021 14:04    Anti-infectives: Anti-infectives (From admission, onward)   Start     Dose/Rate Route Frequency Ordered Stop   01/21/21 2200  vancomycin (VANCOCIN) 50 mg/mL oral solution 125 mg        125 mg Oral 4 times daily 01/21/21 2028 01/31/21 2159   01/21/21 1800  ceFEPIme (MAXIPIME) 2 g in sodium chloride 0.9 % 100 mL IVPB        2 g 200 mL/hr over 30 Minutes Intravenous Every 24 hours 12/24/2020 1742     01/21/21 0000  metroNIDAZOLE (FLAGYL) IVPB 500 mg        500 mg 100 mL/hr over 60 Minutes Intravenous Every 8 hours 01/13/2021 1821     01/15/2021 1800  vancomycin (VANCOCIN) IVPB 1000 mg/200 mL premix        1,000 mg 200 mL/hr over 60 Minutes Intravenous Every 48 hours 01/04/2021 1742     01/01/2021 1545  ceFEPIme (MAXIPIME) 2 g in sodium chloride 0.9 % 100 mL IVPB        2 g 200 mL/hr over 30 Minutes Intravenous  Once 12/24/2020 1544 01/09/2021 1625   01/05/2021 1545  metroNIDAZOLE (FLAGYL) IVPB 500 mg        500 mg 100 mL/hr over 60 Minutes Intravenous  Once 12/30/2020 1544 01/15/2021 1730   01/16/2021 1545  vancomycin (VANCOCIN) IVPB 1000 mg/200 mL premix  Status:  Discontinued        1,000 mg 200 mL/hr over 60 Minutes Intravenous  Once 01/11/2021 1544 01/16/2021 1847      Assessment/Plan: Polycystic disease/ESRDwith renal transplant 2013-Prograf/prednisone. Elevated Cr- 2.19>>2.09 Hx  CHF Chronic COPD/history of tobacco use- On 2L PRN at home HxAge indeterminate, non occlusive deep vein thrombosis involving the right popliteal vein Hx SVT/Atrial flutter with  RVR/TEE/DC cardioversion 12/25/20 Large hematoma left arm - drained and dressed- cleaning up hematoma still present Anemia- H/H 8.5/27.1>>7.3/24.1 Severe protein calorie malnutrition-Total protein 6.0/albumin 1.9 Hyponatremia Hypercoagulable - INR 2.8 (on Eliquis) Severe malnutrition-prealbumin<5  Sepsis and 3.2 x 2.8 x 5.3 cm abdominal fluid/abscess collectionadjacent to enterotomy staple line HxPerforated sigmoid diverticulitis -S/pExploratory laparotomy with sigmoid colectomy/end colostomy, 12/29/2020 Dr. Coralie Keens Non healing midline abdominal wound  Overall looks better  IR for aspiration when able    EHA:ZCUNMM, supplements per RD GY:[id number], Flagyl, Vancomycin 2/28>> DVT:INR 2.8 >>3.1   LOS: 2 days    Turner Daniels MD  01/22/2021

## 2021-01-22 NOTE — Progress Notes (Signed)
PROGRESS NOTE    April [name]  LDJ:[id number] DOB: [birth year] DOA: 01/15/2021 PCP: [name], MD   Brief Narrative: April [name] is a 65 y.o. female with a history of atrial flutter s/p DCCV, chronic respiratory failure on 2 lpm of oxygen, asthma, possible OSA, CKD stage IIIa, ESRD s/p renal transplant, diastolic heart failure, diabetes, Raynaud's phenomenon. Patient presented secondary to acute encephalopathy with associated hypotension in setting of intraabdominal fluid collection and recent intraabdominal surgery. General surgery and IR consulted for management. Patient managed empirically on antibiotics. Found to have C. Difficile and started on Vancomycin PO.   Assessment & Plan:   Principal Problem:   Acute metabolic encephalopathy Active Problems:   Renal transplant recipient   Depression   Acute respiratory failure with hypoxia (HCC)   Acute on chronic diastolic CHF (congestive heart failure) (HCC)   Acute renal failure superimposed on stage 3b chronic kidney disease (HCC)   Hypotension   Acute metabolic encephalopathy Unsure of etiology but resolved.  Abdominal fluid collection 3.2 x 2.8 x 5.3 cm abscess/fluid collection noted on CT scan. In setting of recent surgery. General surgery consulted who recommends IR drainage. -IR recommendations: plan for aspiration today; NPO -Continue Cefepime/Flagyl  Acute on chronic respiratory failure with hypoxia Unknown etiology but back to baseline 2 Lpm of oxygen. -Continue oxygen @ 2 lpm  AKI on CKD stage IIIa History of ESRD s/p renal transplant. Currently on mycophenolate, racrolimus. Left arm fistula. Baseline creatinine of 1.1-1.5 per chart review. Creatinine of 2.19 on admission in setting of hypotension in addition to heart failure.  Acute on chronic diastolic heart failure BNP significantly lower from baseline.  -Hold lasix for now -Daily BMP  C. Difficile infection Mild symptoms. Positive antigen and  toxin. Started on Vancomycin PO -Continue Vancomycin PO x 10 days  Acute on chronic anemia Normocytic anemia Iron deficiency anemia Acute anemia in setting of recent abdominal surgery. Currently stable. No current evidence of bleed -Daily CBC  Hypotension Patient received two 500 mL NS boluses. Does not appear to have had shock. Did not require vasopressor support. Resolved.  Acute urinary retention Unsure of etiology. Foley placed on admission -Voiding trial prior to discharge  Adrenal insufficiency Patient is on prednisone 7.5 mg daily as an outpatient. Started on stress dose steroids secondary   Typical atrial flutter Do not see mention of atrial fibrillation from last admission. Patient underwent Transesophageal Echocardiogram/cardioversion on 12/29/2020. Converted to sinus rhythm and started on Eliquis. Heparin IV started on admission. -Continue heparin IV and transition back to Eliquis pending management of above -Continue   Diabetes mellitus, type 2 Patient is on Lantus as an outpaitnet  Raynaud's On amlodipine as an outpatient which was held secondary to hypotension.  Possible OSA -Continue CPAP qhs  Hyponatremia Mild. Patient hypovolemic on admission which may be etiology. Given NS boluses -BMP  History of renal transplant -Continue home mycophenolate and tacrolimus  Primary hypertension Patient is on amlodipine, metoprolol and furosemide as an outpatient which were held secondary to hypotension.  Depression Patient is on Prozac as an outpatient -Continue Prozac  Gout Patient is on colchicine and allopurinol as an outpatient -Continue allopurinol   DVT prophylaxis: Heparin IV Code Status:   Code Status: Full Code Family Communication: Daughter on telephone Disposition Plan: Discharge home vs SNF likely in several days pending general surgery/IR recommendations in addition to PT recommendations   Consultants:   General surgery  Interventional  radiology  Procedures:   None  Antimicrobials:  Vancomycin IV  Vancomycin PO  Flagyl  Cefepime    Subjective: Abdominal pain mostly in lower quadrant. Decreased appetite. No nausea/vomiting. Unsure of bowel movements.  Objective: Vitals:   01/22/21 0232 01/22/21 0400 01/22/21 0500 01/22/21 0600  BP:  116/60  114/69  Pulse: 81 82 77 82  Resp: (!) 21 (!) 22 14 (!) 26  Temp:  97.6 F (36.4 C)    TempSrc:  Bladder    SpO2: 100% 100% 100% 100%  Weight:   73.3 kg   Height:        Intake/Output Summary (Last 24 hours) at 01/22/2021 0737 Last data filed at 01/22/2021 8786 Gross per 24 hour  Intake 523.48 ml  Output 600 ml  Net -76.52 ml   Filed Weights   01/17/2021 1855 01/21/21 0749 01/22/21 0500  Weight: 71.4 kg 74.2 kg 73.3 kg    Examination:  General exam: Appears calm and comfortable. Ill appearing. Respiratory system: Clear to auscultation. Respiratory effort normal. Cardiovascular system: S1 & S2 heard, RRR. 3/6 systolic murmur Gastrointestinal system: Abdomen is nondistended, soft and nontender. No organomegaly or masses felt. Normal bowel sounds heard. Central nervous system: Alert and oriented. No focal neurological deficits. Musculoskeletal: No edema. No calf tenderness Skin: No cyanosis. No rashes Psychiatry: Judgement and insight appear normal. Mood & affect appropriate.     Data Reviewed: I have personally reviewed following labs and imaging studies  CBC Lab Results  Component Value Date   WBC 4.4 01/22/2021   RBC 2.37 (L) 01/22/2021   HGB 7.4 (L) 01/22/2021   HCT 23.7 (L) 01/22/2021   MCV 100.0 01/22/2021   MCH 31.2 01/22/2021   PLT 161 01/22/2021   MCHC 31.2 01/22/2021   RDW 19.4 (H) 01/22/2021   LYMPHSABS 0.4 (L) 01/22/2021   MONOABS 0.1 01/22/2021   EOSABS 0.0 01/22/2021   BASOSABS 0.0 76/72/0947     Last metabolic panel Lab Results  Component Value Date   NA 128 (L) 01/22/2021   K 4.3 01/22/2021   CL 101 01/22/2021   CO2 17  (L) 01/22/2021   BUN 53 (H) 01/22/2021   CREATININE 2.10 (H) 01/22/2021   GLUCOSE 200 (H) 01/22/2021   GFRNONAA 26 (L) 01/22/2021   GFRAA 51 (L) 08/05/2020   CALCIUM 8.2 (L) 01/22/2021   PHOS 4.1 01/21/2021   PROT 5.7 (L) 01/22/2021   ALBUMIN 2.1 (L) 01/22/2021   LABGLOB 3.8 11/04/2020   AGRATIO 0.9 05/31/2020   BILITOT 1.1 01/22/2021   ALKPHOS 80 01/22/2021   AST 20 01/22/2021   ALT 9 01/22/2021   ANIONGAP 10 01/22/2021    CBG (last 3)  Recent Labs    01/21/21 1148 01/21/21 1554 01/21/21 2150  GLUCAP 116* 109* 155*     GFR: Estimated Creatinine Clearance: 25.1 mL/min (A) (by C-G formula based on SCr of 2.1 mg/dL (H)).  Coagulation Profile: Recent Labs  Lab 01/15/2021 1339 01/21/21 0239 01/22/21 0258  INR 2.8* 3.1* 2.2*    Recent Results (from the past 240 hour(s))  SARS CORONAVIRUS 2 (TAT 6-24 HRS) Nasopharyngeal Nasopharyngeal Swab     Status: None   Collection Time: 01/14/21 12:39 AM   Specimen: Nasopharyngeal Swab  Result Value Ref Range Status   SARS Coronavirus 2 NEGATIVE NEGATIVE Final    Comment: (NOTE) SARS-CoV-2 target nucleic acids are NOT DETECTED.  The SARS-CoV-2 RNA is generally detectable in upper and lower respiratory specimens during the acute phase of infection. Negative results do not preclude SARS-CoV-2 infection, do not rule out  co-infections with other pathogens, and should not be used as the sole basis for treatment or other patient management decisions. Negative results must be combined with clinical observations, patient history, and epidemiological information. The expected result is Negative.  Fact Sheet for Patients: SugarRoll.be  Fact Sheet for Healthcare Providers: https://www.woods-mathews.com/  This test is not yet approved or cleared by the [place] FDA and  has been authorized for detection and/or diagnosis of SARS-CoV-2 by FDA under an Emergency Use Authorization (EUA). This  EUA will remain  in effect (meaning this test can be used) for the duration of the COVID-19 declaration under Se ction 564(b)(1) of the Act, 21 U.S.C. section 360bbb-3(b)(1), unless the authorization is terminated or revoked sooner.  Performed at [facility], [place] [address], [place], [place] [address]   Resp Panel by RT-PCR (Flu A&B, Covid) Nasopharyngeal Swab     Status: None   Collection Time: 01/19/2021  2:05 PM   Specimen: Nasopharyngeal Swab; Nasopharyngeal(NP) swabs in vial transport medium  Result Value Ref Range Status   SARS Coronavirus 2 by RT PCR NEGATIVE NEGATIVE Final    Comment: (NOTE) SARS-CoV-2 target nucleic acids are NOT DETECTED.  The SARS-CoV-2 RNA is generally detectable in upper respiratory specimens during the acute phase of infection. The lowest concentration of SARS-CoV-2 viral copies this assay can detect is 138 copies/mL. A negative result does not preclude SARS-Cov-2 infection and should not be used as the sole basis for treatment or other patient management decisions. A negative result may occur with  improper specimen collection/handling, submission of specimen other than nasopharyngeal swab, presence of viral mutation(s) within the areas targeted by this assay, and inadequate number of viral copies(<138 copies/mL). A negative result must be combined with clinical observations, patient history, and epidemiological information. The expected result is Negative.  Fact Sheet for Patients:  EntrepreneurPulse.com.au  Fact Sheet for Healthcare Providers:  IncredibleEmployment.be  This test is no t yet approved or cleared by the [place] FDA and  has been authorized for detection and/or diagnosis of SARS-CoV-2 by FDA under an Emergency Use Authorization (EUA). This EUA will remain  in effect (meaning this test can be used) for the duration of the COVID-19 declaration under Section 564(b)(1) of the Act,  21 U.S.C.section 360bbb-3(b)(1), unless the authorization is terminated  or revoked sooner.       Influenza A by PCR NEGATIVE NEGATIVE Final   Influenza B by PCR NEGATIVE NEGATIVE Final    Comment: (NOTE) The Xpert Xpress SARS-CoV-2/FLU/RSV plus assay is intended as an aid in the diagnosis of influenza from Nasopharyngeal swab specimens and should not be used as a sole basis for treatment. Nasal washings and aspirates are unacceptable for Xpert Xpress SARS-CoV-2/FLU/RSV testing.  Fact Sheet for Patients: EntrepreneurPulse.com.au  Fact Sheet for Healthcare Providers: IncredibleEmployment.be  This test is not yet approved or cleared by the [place] FDA and has been authorized for detection and/or diagnosis of SARS-CoV-2 by FDA under an Emergency Use Authorization (EUA). This EUA will remain in effect (meaning this test can be used) for the duration of the COVID-19 declaration under Section 564(b)(1) of the Act, 21 U.S.C. section 360bbb-3(b)(1), unless the authorization is terminated or revoked.  Performed at [facility], [place] [address], [place], [place] [address]   Culture, blood (Routine x 2)     Status: None (Preliminary result)   Collection Time: 01/10/2021  3:28 PM   Specimen: BLOOD  Result Value Ref Range Status   Specimen  Description   Final    BLOOD RIGHT ARM Performed at [place] [address], [place], [place] [address]    Special Requests   Final    BOTTLES DRAWN AEROBIC AND ANAEROBIC Blood Culture adequate volume Performed at [place] [address] [place], [place] [address]    Culture   Final    NO GROWTH < 12 HOURS Performed at [place] [address], [place], [place] [address]    Report Status PENDING  Incomplete  MRSA PCR Screening     Status: None   Collection Time: 12/29/2020  6:20 PM   Specimen: Nasopharyngeal  Result Value Ref  Range Status   MRSA by PCR NEGATIVE NEGATIVE Final    Comment:        The GeneXpert MRSA Assay (FDA approved for NASAL specimens only), is one component of a comprehensive MRSA colonization surveillance program. It is not intended to diagnose MRSA infection nor to guide or monitor treatment for MRSA infections. Performed at [facility], [place] [address], [place], [place] [address]   C Difficile Quick Screen w PCR reflex     Status: Abnormal   Collection Time: 01/21/21  3:43 PM   Specimen: STOOL  Result Value Ref Range Status   C Diff antigen POSITIVE (A) NEGATIVE Final   C Diff toxin POSITIVE (A) NEGATIVE Final   C Diff interpretation Toxin producing C. difficile detected.  Final    Comment: CRITICAL RESULT CALLED TO, READ BACK BY AND VERIFIED WITH: P.MERCADO AT 2017 ON 03.01.22 BY N.THOMPSON Performed at [facility], [place] [address] [place], [place] [address]         Radiology Studies: CT ABDOMEN PELVIS WO CONTRAST  Result Date: 01/02/2021 CLINICAL DATA:  Respiratory failure, history of kidney transplant, recent abdominal surgery with drainage EXAM: CT CHEST, ABDOMEN AND PELVIS WITHOUT CONTRAST TECHNIQUE: Multidetector CT imaging of the chest, abdomen and pelvis was performed following the standard protocol without IV contrast. Unenhanced CT was performed per clinician order. Lack of IV contrast limits sensitivity and specificity, especially for evaluation of abdominal/pelvic solid viscera. COMPARISON:  05/14/2020, 12/29/2020 FINDINGS: CT CHEST FINDINGS Cardiovascular: Unenhanced imaging of the heart and great vessels demonstrates no pericardial effusion. Normal caliber of the thoracic aorta. Minimal atherosclerosis. Incidental note is made of prominent vascular structures in the left upper extremity consistent with previous AV fistula compatible with history of renal failure. Mediastinum/Nodes: No enlarged mediastinal, hilar, or axillary lymph  nodes. Thyroid gland, trachea, and esophagus demonstrate no significant findings. Lungs/Pleura: There are small bilateral pleural effusions, right greater than left. Bilateral lower lobe atelectasis greatest in the right lower lobe. Patchy lingular consolidation may reflect hypoventilatory change versus early infection. No pneumothorax. Central airways are patent. Musculoskeletal: Prior healed bilateral rib fractures. No acute or destructive bony lesions. Reconstructed images demonstrate no additional findings. CT ABDOMEN PELVIS FINDINGS Hepatobiliary: Multiple hepatic cysts are again noted. Otherwise unremarkable unenhanced evaluation of the liver. Small gallstones are identified within the gallbladder with no evidence of acute cholecystitis. Pancreas: Unremarkable. No pancreatic ductal dilatation or surrounding inflammatory changes. Spleen: Mild splenomegaly unchanged.  No focal abnormalities. Adrenals/Urinary Tract: Numerous cysts are seen throughout the native kidneys, unchanged. Right lower quadrant renal transplant is unremarkable. No urinary tract calculi or obstructive uropathy. Bladder is unremarkable without filling defect. The adrenals are normal. Stomach/Bowel: No bowel obstruction or ileus. Diverting colostomy left lower quadrant is noted. Minimal diverticulosis of the remaining sigmoid colon.  There is a fluid collection in the left lower quadrant measuring 3.3 x 2.8 cm on image 92/7, and extending approximately 5.3 cm in craniocaudal direction image 127/4. Fluid collection abuts the enterotomy staple line in the left lower quadrant, and could reflect postoperative seroma or abscess. Evaluation is limited without IV and oral contrast. No bowel wall thickening.  Normal appendix right lower quadrant. Vascular/Lymphatic: Aortic atherosclerosis. No enlarged abdominal or pelvic lymph nodes. Reproductive: Uterus and bilateral adnexa are unremarkable. Other: Postsurgical changes are seen from midline  laparotomy, with residual surgical defect and packing. No subcutaneous fluid collection. There is a left inguinal hernia containing a small amount of fluid, though decreased since prior study. No evidence of bowel herniation. Aside from the left lower quadrant fluid collection described above, no additional areas of intra-abdominal fluid or free gas. Musculoskeletal: Postsurgical changes from left hip arthroplasty. There are no acute or destructive bony lesions. Reconstructed images demonstrate no additional findings. IMPRESSION: 1. Fluid collection left lower quadrant abutting the staple line of the sigmoid colon. Findings could reflect postoperative seroma or abscess. Evaluation limited without IV and oral contrast. 2. Small bilateral pleural effusions and bilateral lower lobe atelectasis, right greater than left. 3. Patchy non dependent consolidation within the lingula, which could reflect hypoventilatory change or early infection. 4. Sequela of end-stage renal disease, with unremarkable transplant kidney right lower quadrant. 5. Postsurgical changes from midline laparotomy and left lower quadrant colostomy. Surgical defect from the midline laparotomy without fluid collection or abdominal wall abscess. 6. Stable splenomegaly. 7.  Aortic Atherosclerosis (ICD10-I70.0). Electronically Signed   By: Randa Ngo M.D.   On: 01/19/2021 15:14   CT Chest Wo Contrast  Result Date: 12/29/2020 CLINICAL DATA:  Respiratory failure, history of kidney transplant, recent abdominal surgery with drainage EXAM: CT CHEST, ABDOMEN AND PELVIS WITHOUT CONTRAST TECHNIQUE: Multidetector CT imaging of the chest, abdomen and pelvis was performed following the standard protocol without IV contrast. Unenhanced CT was performed per clinician order. Lack of IV contrast limits sensitivity and specificity, especially for evaluation of abdominal/pelvic solid viscera. COMPARISON:  05/14/2020, 12/29/2020 FINDINGS: CT CHEST FINDINGS  Cardiovascular: Unenhanced imaging of the heart and great vessels demonstrates no pericardial effusion. Normal caliber of the thoracic aorta. Minimal atherosclerosis. Incidental note is made of prominent vascular structures in the left upper extremity consistent with previous AV fistula compatible with history of renal failure. Mediastinum/Nodes: No enlarged mediastinal, hilar, or axillary lymph nodes. Thyroid gland, trachea, and esophagus demonstrate no significant findings. Lungs/Pleura: There are small bilateral pleural effusions, right greater than left. Bilateral lower lobe atelectasis greatest in the right lower lobe. Patchy lingular consolidation may reflect hypoventilatory change versus early infection. No pneumothorax. Central airways are patent. Musculoskeletal: Prior healed bilateral rib fractures. No acute or destructive bony lesions. Reconstructed images demonstrate no additional findings. CT ABDOMEN PELVIS FINDINGS Hepatobiliary: Multiple hepatic cysts are again noted. Otherwise unremarkable unenhanced evaluation of the liver. Small gallstones are identified within the gallbladder with no evidence of acute cholecystitis. Pancreas: Unremarkable. No pancreatic ductal dilatation or surrounding inflammatory changes. Spleen: Mild splenomegaly unchanged.  No focal abnormalities. Adrenals/Urinary Tract: Numerous cysts are seen throughout the native kidneys, unchanged. Right lower quadrant renal transplant is unremarkable. No urinary tract calculi or obstructive uropathy. Bladder is unremarkable without filling defect. The adrenals are normal. Stomach/Bowel: No bowel obstruction or ileus. Diverting colostomy left lower quadrant is noted. Minimal diverticulosis of the remaining sigmoid colon. There is a fluid collection in the left lower quadrant measuring 3.3 x 2.8 cm  on image 92/7, and extending approximately 5.3 cm in craniocaudal direction image 127/4. Fluid collection abuts the enterotomy staple line in  the left lower quadrant, and could reflect postoperative seroma or abscess. Evaluation is limited without IV and oral contrast. No bowel wall thickening.  Normal appendix right lower quadrant. Vascular/Lymphatic: Aortic atherosclerosis. No enlarged abdominal or pelvic lymph nodes. Reproductive: Uterus and bilateral adnexa are unremarkable. Other: Postsurgical changes are seen from midline laparotomy, with residual surgical defect and packing. No subcutaneous fluid collection. There is a left inguinal hernia containing a small amount of fluid, though decreased since prior study. No evidence of bowel herniation. Aside from the left lower quadrant fluid collection described above, no additional areas of intra-abdominal fluid or free gas. Musculoskeletal: Postsurgical changes from left hip arthroplasty. There are no acute or destructive bony lesions. Reconstructed images demonstrate no additional findings. IMPRESSION: 1. Fluid collection left lower quadrant abutting the staple line of the sigmoid colon. Findings could reflect postoperative seroma or abscess. Evaluation limited without IV and oral contrast. 2. Small bilateral pleural effusions and bilateral lower lobe atelectasis, right greater than left. 3. Patchy non dependent consolidation within the lingula, which could reflect hypoventilatory change or early infection. 4. Sequela of end-stage renal disease, with unremarkable transplant kidney right lower quadrant. 5. Postsurgical changes from midline laparotomy and left lower quadrant colostomy. Surgical defect from the midline laparotomy without fluid collection or abdominal wall abscess. 6. Stable splenomegaly. 7.  Aortic Atherosclerosis (ICD10-I70.0). Electronically Signed   By: Randa Ngo M.D.   On: 01/08/2021 15:14   DG Chest Portable 1 View  Result Date: 12/31/2020 CLINICAL DATA:  Hypoxia EXAM: PORTABLE CHEST 1 VIEW COMPARISON:  01/01/2021 FINDINGS: Central line is been removed since the prior study.  Improved aeration in the bases with residual mild bibasilar airspace disease likely atelectasis. Negative for edema or effusion. IMPRESSION: Mild bibasilar atelectasis Electronically Signed   By: Franchot Gallo M.D.   On: 01/03/2021 14:04        Scheduled Meds: . (feeding supplement) PROSource Plus  30 mL Oral BID BM  . allopurinol  100 mg Oral Daily  . brimonidine  1 drop Both Eyes BID  . Chlorhexidine Gluconate Cloth  6 each Topical Daily  . cycloSPORINE  1 drop Both Eyes BID  . dorzolamide  1 drop Both Eyes Daily  . feeding supplement  237 mL Oral BID BM  . FLUoxetine  40 mg Oral Daily  . hydrocortisone sod succinate (SOLU-CORTEF) inj  50 mg Intravenous Q8H  . insulin aspart  0-5 Units Subcutaneous QHS  . insulin aspart  0-9 Units Subcutaneous TID WC  . insulin glargine  5 Units Subcutaneous BID  . latanoprost  1 drop Both Eyes QHS  . mouth rinse  15 mL Mouth Rinse BID  . multivitamin with minerals  1 tablet Oral Daily  . mycophenolate  180 mg Oral BID  . nutrition supplement (JUVEN)  1 packet Oral BID BM  . sodium chloride flush  3 mL Intravenous Q12H  . tacrolimus  1 mg Oral BID  . vancomycin  125 mg Oral QID   Continuous Infusions: . ceFEPime (MAXIPIME) IV Stopped (01/21/21 1835)  . heparin 550 Units/hr (01/22/21 0350)  . metronidazole Stopped (01/22/21 0108)  . vancomycin Stopped (01/19/2021 2156)     LOS: 2 days     Cordelia Poche, MD [facility] 01/22/2021, 7:37 AM  If 7PM-7AM, please contact night-coverage [email or web address]

## 2021-01-22 NOTE — Progress Notes (Signed)
ANTICOAGULATION CONSULT NOTE - Follow Up Consult  Pharmacy Consult for Heparin Indication: atrial fibrillation  Allergies  Allergen Reactions  . Infed [Iron Dextran] Other (See Comments)    Chest tightness  . Pentamidine Itching, Shortness Of Breath and Swelling  . Budesonide-Formoterol Fumarate     Other reaction(s): hoarseness  . Bupropion     Other reaction(s): exacerbated depression  . Crestor [Rosuvastatin]     Other reaction(s): body aches, swelling  . Duloxetine Hcl     Other reaction(s): GI SE, weepy, worsening fatigue, foggy  . Erythromycin [Erythromycin] Other (See Comments)    Mouth Ulcers  . Iohexol Other (See Comments)    Per patient "she has had a kidney transplant and should never have contrast"  . Oxycodone Nausea Only and Nausea And Vomiting  . Pravastatin Sodium     Other reaction(s): myalgias  . Erythromycin Rash    Causes breakout in mouth  . Ultram [Tramadol Hcl] Anxiety    Patient Measurements: Height: 5\' 1"  (154.9 cm) Weight: 73.3 kg (161 lb 9.6 oz) IBW/kg (Calculated) : 47.8 Heparin Dosing Weight: 63kg  Vital Signs: Temp: 97.6 F (36.4 C) (03/02 0400) Temp Source: Bladder (03/02 0400) BP: 114/69 (03/02 0600) Pulse Rate: 82 (03/02 0600)  Labs: Recent Labs    12/27/2020 1339 01/21/21 0239 01/21/21 1000 01/22/21 0258  HGB 8.5* 7.3*  --  7.4*  HCT 27.1* 24.1*  --  23.7*  PLT 199 141*  --  161  APTT  --  133* 87* 68*  LABPROT 28.9* 30.8*  --  23.5*  INR 2.8* 3.1*  --  2.2*  HEPARINUNFRC  --  >2.20*  --  >2.20*  CREATININE 2.19* 2.09*  --  2.10*    Estimated Creatinine Clearance: 25.1 mL/min (A) (by C-G formula based on SCr of 2.1 mg/dL (H)).   Medications:  Infusions:  . ceFEPime (MAXIPIME) IV Stopped (01/21/21 1835)  . heparin 550 Units/hr (01/22/21 8270)  . metronidazole Stopped (01/22/21 0108)  . vancomycin Stopped (01/05/2021 2156)    Assessment: 81 yoF with PMH PAF s/p DCCV (12/25/20) on Eliquis, chronic resp fail (2L O2  baseline), CKD3 s/p renal txplant, dCHF, DM2, admitted after being [facility] unresponsive at [facility]. Concern for infection after recent abdominal surgery; Pharmacy consulted to convert Eliquis to IV heparin while possible procedures pending. During last admission, heparin was especially difficult to control, with rates ranging from 1650 units/hr down to 250 units/hr. Patient also noted to have significant bruising and hematomas on several extremities last admission. However, Cardiology has recommended at least 30 days of anticoagulation after DCCV (through 3/4).  PTA Eliquis 5mg  BID, last dose 2/28 09:00.  Baseline INR elevated; aPTT not done  Today, 01/22/2021:  Heparin level remains > 2.2 (artificially elevated d/t recent DOAC use, along with AKI)  APTT 68, decreased, but remains therapeutic within goal range.  CBC:  Hgb 7.4 (remains low/stable), Plt 161  Known bruising.  No bleeding or complications reported.  Goal of Therapy:  Heparin level 0.3-0.7 units/ml aPTT 66-102 seconds Monitor platelets by anticoagulation protocol: Yes   Plan:   Continue heparin IV infusion at 550 units/hr  Daily heparin level, aPTT and CBC  Follow up plans for IR or surgical interventions  Gretta Arab PharmD, BCPS Clinical Pharmacist [facility] main pharmacy [phone] 01/22/2021 7:16 AM

## 2021-01-23 DIAGNOSIS — G9341 Metabolic encephalopathy: Secondary | ICD-10-CM | POA: Diagnosis not present

## 2021-01-23 DIAGNOSIS — N179 Acute kidney failure, unspecified: Secondary | ICD-10-CM | POA: Diagnosis not present

## 2021-01-23 DIAGNOSIS — A498 Other bacterial infections of unspecified site: Secondary | ICD-10-CM | POA: Diagnosis not present

## 2021-01-23 DIAGNOSIS — I5033 Acute on chronic diastolic (congestive) heart failure: Secondary | ICD-10-CM | POA: Diagnosis not present

## 2021-01-23 LAB — GLUCOSE, CAPILLARY
Glucose-Capillary: 147 mg/dL — ABNORMAL HIGH (ref 70–99)
Glucose-Capillary: 163 mg/dL — ABNORMAL HIGH (ref 70–99)
Glucose-Capillary: 171 mg/dL — ABNORMAL HIGH (ref 70–99)
Glucose-Capillary: 173 mg/dL — ABNORMAL HIGH (ref 70–99)

## 2021-01-23 LAB — BASIC METABOLIC PANEL
Anion gap: 5 (ref 5–15)
BUN: 53 mg/dL — ABNORMAL HIGH (ref 8–23)
CO2: 22 mmol/L (ref 22–32)
Calcium: 8.5 mg/dL — ABNORMAL LOW (ref 8.9–10.3)
Chloride: 102 mmol/L (ref 98–111)
Creatinine, Ser: 2.21 mg/dL — ABNORMAL HIGH (ref 0.44–1.00)
GFR, Estimated: 24 mL/min — ABNORMAL LOW (ref >60)
Glucose, Bld: 179 mg/dL — ABNORMAL HIGH (ref 70–99)
Potassium: 4 mmol/L (ref 3.5–5.1)
Sodium: 129 mmol/L — ABNORMAL LOW (ref 135–145)

## 2021-01-23 LAB — APTT
aPTT: 57 seconds — ABNORMAL HIGH (ref 24–36)
aPTT: 66 seconds — ABNORMAL HIGH (ref 24–36)
aPTT: 69 seconds — ABNORMAL HIGH (ref 24–36)

## 2021-01-23 LAB — CBC
HCT: 26.4 % — ABNORMAL LOW (ref 36.0–46.0)
Hemoglobin: 8.2 g/dL — ABNORMAL LOW (ref 12.0–15.0)
MCH: 31.2 pg (ref 26.0–34.0)
MCHC: 31.1 g/dL (ref 30.0–36.0)
MCV: 100.4 fL — ABNORMAL HIGH (ref 80.0–100.0)
Platelets: 174 10*3/uL (ref 150–400)
RBC: 2.63 MIL/uL — ABNORMAL LOW (ref 3.87–5.11)
RDW: 19.4 % — ABNORMAL HIGH (ref 11.5–15.5)
WBC: 4.8 10*3/uL (ref 4.0–10.5)
nRBC: 0 % (ref 0.0–0.2)

## 2021-01-23 LAB — PROTIME-INR
INR: 1.6 — ABNORMAL HIGH (ref 0.8–1.2)
Prothrombin Time: 18.8 seconds — ABNORMAL HIGH (ref 11.4–15.2)

## 2021-01-23 LAB — HEPARIN LEVEL (UNFRACTIONATED): Heparin Unfractionated: 2.18 IU/mL — ABNORMAL HIGH (ref 0.30–0.70)

## 2021-01-23 MED ORDER — FUROSEMIDE 10 MG/ML IJ SOLN
40.0000 mg | Freq: Once | INTRAMUSCULAR | Status: AC
  Administered 2021-01-23: 40 mg via INTRAVENOUS
  Filled 2021-01-23: qty 4

## 2021-01-23 NOTE — Progress Notes (Signed)
Pharmacy Antibiotic Note  April [name] is a 65 y.o. female admitted on 12/31/2020 with abdominal wound infection, planning for IR drainage.  Pharmacy has been consulted for cefepime dosing. Also on PO vancomycin for +C.diff  01/23/2021 Day #3 full abx - Afebrile - WBC wnl - SCr increased 2.21, CrCl 24 ml/min - Cultures neg to date  Plan:  Continue Cefepime 2g IV q24 per current renal function  Flagyl 500mg  IV q8h per MD  Follow up renal function & cultures  Height: 5\' 1"  (154.9 cm) Weight: 73.3 kg (161 lb 9.6 oz) IBW/kg (Calculated) : 47.8  Temp (24hrs), Avg:97.3 F (36.3 C), Min:97 F (36.1 C), Max:97.7 F (36.5 C)  Recent Labs  Lab 01/11/2021 1339 01/06/2021 1537 01/21/21 0239 01/22/21 0258 01/23/21 0248  WBC 6.8  --  3.5* 4.4 4.8  CREATININE 2.19*  --  2.09* 2.10* 2.21*  LATICACIDVEN 0.8 0.9  --   --   --     Estimated Creatinine Clearance: 23.9 mL/min (A) (by C-G formula based on SCr of 2.21 mg/dL (H)).    Allergies  Allergen Reactions  . Infed [Iron Dextran] Other (See Comments)    Chest tightness  . Pentamidine Itching, Shortness Of Breath and Swelling  . Budesonide-Formoterol Fumarate     Other reaction(s): hoarseness  . Bupropion     Other reaction(s): exacerbated depression  . Crestor [Rosuvastatin]     Other reaction(s): body aches, swelling  . Duloxetine Hcl     Other reaction(s): GI SE, weepy, worsening fatigue, foggy  . Erythromycin [Erythromycin] Other (See Comments)    Mouth Ulcers  . Iohexol Other (See Comments)    Per patient "she has had a kidney transplant and should never have contrast"  . Oxycodone Nausea Only and Nausea And Vomiting  . Pravastatin Sodium     Other reaction(s): myalgias  . Erythromycin Rash    Causes breakout in mouth  . Ultram [Tramadol Hcl] Anxiety   Antimicrobials this admission: 2/28 vanc IV >>3/2 2/28 cefepime >> 2/28 flagyl >> 3/1 Vanc PO >>   Dose adjustments this admission:  Microbiology results: 2/28  Covid: negative 2/28 MRSA PCR: negative 2/28 BCx: ngtd 2/28 UCx: NGF 3/1 CDiff: Ag +, toxin +   Thank you for allowing pharmacy to be a part of this patient's care.  Peggyann Juba, PharmD, BCPS Pharmacy: [phone] 01/23/2021 7:27 AM

## 2021-01-23 NOTE — Progress Notes (Signed)
ANTICOAGULATION CONSULT NOTE - Follow Up Consult  Pharmacy Consult for Heparin Indication: atrial fibrillation  Allergies  Allergen Reactions  . Infed [Iron Dextran] Other (See Comments)    Chest tightness  . Pentamidine Itching, Shortness Of Breath and Swelling  . Budesonide-Formoterol Fumarate     Other reaction(s): hoarseness  . Bupropion     Other reaction(s): exacerbated depression  . Crestor [Rosuvastatin]     Other reaction(s): body aches, swelling  . Duloxetine Hcl     Other reaction(s): GI SE, weepy, worsening fatigue, foggy  . Erythromycin [Erythromycin] Other (See Comments)    Mouth Ulcers  . Iohexol Other (See Comments)    Per patient "she has had a kidney transplant and should never have contrast"  . Oxycodone Nausea Only and Nausea And Vomiting  . Pravastatin Sodium     Other reaction(s): myalgias  . Erythromycin Rash    Causes breakout in mouth  . Ultram [Tramadol Hcl] Anxiety    Patient Measurements: Height: 5\' 1"  (154.9 cm) Weight: 73.3 kg (161 lb 9.6 oz) IBW/kg (Calculated) : 47.8 Heparin Dosing Weight: 63kg  Vital Signs: Temp: 98.6 F (37 C) (03/03 2000) Temp Source: Bladder (03/03 2000) BP: 136/79 (03/03 2000) Pulse Rate: 88 (03/03 2000)  Labs: Recent Labs    01/21/21 0239 01/21/21 1000 01/22/21 0258 01/23/21 0248 01/23/21 1304 01/23/21 2042  HGB 7.3*  --  7.4* 8.2*  --   --   HCT 24.1*  --  23.7* 26.4*  --   --   PLT 141*  --  161 174  --   --   APTT 133*   < > 68* 57* 66* 69*  LABPROT 30.8*  --  23.5* 18.8*  --   --   INR 3.1*  --  2.2* 1.6*  --   --   HEPARINUNFRC >2.20*  --  >2.20* 2.18*  --   --   CREATININE 2.09*  --  2.10* 2.21*  --   --    < > = values in this interval not displayed.    Estimated Creatinine Clearance: 23.9 mL/min (A) (by C-G formula based on SCr of 2.21 mg/dL (H)).   Assessment: 7 yoF with PMH PAF s/p DCCV (12/25/20) on Eliquis, chronic resp fail (2L O2 baseline), CKD3 s/p renal txplant, dCHF, DM2, admitted  after being found unresponsive at [facility]. Concern for infection after recent abdominal surgery; Pharmacy consulted to convert Eliquis to IV heparin while possible procedures pending. During last admission, heparin was especially difficult to control, with rates ranging from 1650 units/hr down to 250 units/hr. Patient also noted to have significant bruising and hematomas on several extremities last admission. However, Cardiology has recommended at least 30 days of anticoagulation after DCCV (through 3/4).  PTA Eliquis 5mg  BID, last dose 2/28 09:00.  Baseline INR elevated; aPTT not done  Today, 01/23/2021: 2nd shift  Heparin level = 2.18 (artificially elevated d/t recent DOAC use, along with AKI)  Repeat APTT 69 (low end of therapeutic range) on heparin  700 units/hr; heparin rate decreased to 650 units/hr at 1940  CBC:  Hgb 8.2 (remains low/stable), Plt 174  INR 1.6  Known bruising.  No bleeding or complications reported.  IR planning intraabdominal aspiration and possible drain placement tomorrow at 1pm  Goal of Therapy:  Heparin level 0.3-0.7 units/ml aPTT 66-102 seconds Monitor platelets by anticoagulation protocol: Yes   Plan:   Continue heparin at 700 untis/hr until off at 09 am for IR procedure  Daily heparin level,  aPTT and CBC  Heparin to stop tomorrow at 09:00 for IR procedure at 13:00  Eudelia Bunch, Pharm.D 01/23/2021 9:47 PM

## 2021-01-23 NOTE — Progress Notes (Signed)
PROGRESS NOTE    [name]  MWN:[id number] DOB: [birth year] DOA: 01/01/2021 PCP: [name], MD   Brief Narrative: April [name] is a 65 y.o. female with a history of atrial flutter s/p DCCV, chronic respiratory failure on 2 lpm of oxygen, asthma, possible OSA, CKD stage IIIa, ESRD s/p renal transplant, diastolic heart failure, diabetes, Raynaud's phenomenon. Patient presented secondary to acute encephalopathy with associated hypotension in setting of intraabdominal fluid collection and recent intraabdominal surgery. General surgery and IR consulted for management. Patient managed empirically on antibiotics. Found to have C. Difficile and started on Vancomycin PO.   Assessment & Plan:   Principal Problem:   Acute metabolic encephalopathy Active Problems:   Renal transplant recipient   Depression   Acute respiratory failure with hypoxia (HCC)   Acute on chronic diastolic CHF (congestive heart failure) (HCC)   Acute renal failure superimposed on stage 3b chronic kidney disease (HCC)   Hypotension   Acute metabolic encephalopathy Unsure of etiology but resolved.  Abdominal fluid collection 3.2 x 2.8 x 5.3 cm abscess/fluid collection noted on CT scan. In setting of recent surgery. General surgery consulted who recommends IR drainage. -IR recommendations: plan for aspiration tomorrow -Continue Cefepime/Flagyl  Acute on chronic respiratory failure with hypoxia Unknown etiology but back to baseline 2 Lpm of oxygen. -Continue oxygen @ 2 lpm  AKI on CKD stage IIIa History of ESRD s/p renal transplant. Currently on mycophenolate, racrolimus. Left arm fistula. Baseline creatinine of 1.1-1.5 per chart review. Creatinine of 2.19 on admission in setting of hypotension in addition to heart failure. Worsening -Lasix 40 mg x1 -Daily BMP  Acute on chronic diastolic heart failure BNP significantly lower from baseline. Restarted lasix secondary to worsening AKI with significant  edema -Daily BMP  C. Difficile infection Mild symptoms. Positive antigen and toxin. Started on Vancomycin PO -Continue Vancomycin PO x 10 days  Acute on chronic anemia Normocytic anemia Iron deficiency anemia Acute anemia in setting of recent abdominal surgery. Currently stable. No current evidence of bleed -Daily CBC  Hypotension Patient received two 500 mL NS boluses. Does not appear to have had shock. Did not require vasopressor support. Resolved.  Acute urinary retention Unsure of etiology. Foley placed on admission -Voiding trial prior to discharge  Adrenal insufficiency Patient is on prednisone 7.5 mg daily as an outpatient. Started on stress dose steroids secondary   Typical atrial flutter Do not see mention of atrial fibrillation from last admission. Patient underwent Transesophageal Echocardiogram/cardioversion on 12/29/2020. Converted to sinus rhythm and started on Eliquis. Heparin IV started on admission. -Continue heparin IV and transition back to Eliquis pending management of above -Continue   Diabetes mellitus, type 2 Patient is on Lantus as an outpaitnet  Raynaud's On amlodipine as an outpatient which was held secondary to hypotension.  Possible OSA -Continue CPAP qhs  Hyponatremia Mild. Patient hypovolemic on admission which may be etiology. Given NS boluses. Stable. -BMP daily  History of renal transplant -Continue home mycophenolate and tacrolimus  Primary hypertension Patient is on amlodipine, metoprolol and furosemide as an outpatient which were held secondary to hypotension.  Depression Patient is on Prozac as an outpatient -Continue Prozac  Gout Patient is on colchicine and allopurinol as an outpatient -Continue allopurinol   DVT prophylaxis: Heparin IV Code Status:   Code Status: Full Code Family Communication: None at bedside Disposition Plan: Discharge home vs SNF likely in several days pending general surgery/IR recommendations in  addition to PT recommendations   Consultants:  General surgery  Interventional radiology  Procedures:   None  Antimicrobials:  Vancomycin IV  Vancomycin PO  Flagyl  Cefepime    Subjective: No issues overnight.   Objective: Vitals:   01/23/21 0600 01/23/21 0800 01/23/21 1000 01/23/21 1200  BP: 137/72 126/75 (!) 151/73 123/61  Pulse: 82 85 80 84  Resp: 14 (!) 22 19 14   Temp:  97.7 F (36.5 C)    TempSrc:  Bladder    SpO2: 100% 100% 100% 100%  Weight:      Height:        Intake/Output Summary (Last 24 hours) at 01/23/2021 1400 Last data filed at 01/23/2021 1337 Gross per 24 hour  Intake 779.1 ml  Output 700 ml  Net 79.1 ml   Filed Weights   12/28/2020 1855 01/21/21 0749 01/22/21 0500  Weight: 71.4 kg 74.2 kg 73.3 kg    Examination:  General exam: Appears calm and comfortable Respiratory system: Clear to auscultation, diminished. Respiratory effort normal. Cardiovascular system: S1 & S2 heard, RRR. No murmurs, rubs, gallops or clicks. Gastrointestinal system: Abdomen is obese, soft and nontender. No organomegaly or masses felt. Normal bowel sounds heard. Central nervous system: Alert and oriented. No focal neurological deficits. Musculoskeletal: BLE edema. No calf tenderness Skin: No cyanosis. No rashes Psychiatry: Judgement and insight appear normal. Mood & affect appropriate.     Data Reviewed: I have personally reviewed following labs and imaging studies  CBC Lab Results  Component Value Date   WBC 4.8 01/23/2021   RBC 2.63 (L) 01/23/2021   HGB 8.2 (L) 01/23/2021   HCT 26.4 (L) 01/23/2021   MCV 100.4 (H) 01/23/2021   MCH 31.2 01/23/2021   PLT 174 01/23/2021   MCHC 31.1 01/23/2021   RDW 19.4 (H) 01/23/2021   LYMPHSABS 0.4 (L) 01/22/2021   MONOABS 0.1 01/22/2021   EOSABS 0.0 01/22/2021   BASOSABS 0.0 03/50/0938     Last metabolic panel Lab Results  Component Value Date   NA 129 (L) 01/23/2021   K 4.0 01/23/2021   CL 102 01/23/2021    CO2 22 01/23/2021   BUN 53 (H) 01/23/2021   CREATININE 2.21 (H) 01/23/2021   GLUCOSE 179 (H) 01/23/2021   GFRNONAA 24 (L) 01/23/2021   GFRAA 51 (L) 08/05/2020   CALCIUM 8.5 (L) 01/23/2021   PHOS 4.1 01/21/2021   PROT 5.7 (L) 01/22/2021   ALBUMIN 2.1 (L) 01/22/2021   LABGLOB 3.8 11/04/2020   AGRATIO 0.9 05/31/2020   BILITOT 1.1 01/22/2021   ALKPHOS 80 01/22/2021   AST 20 01/22/2021   ALT 9 01/22/2021   ANIONGAP 5 01/23/2021    CBG (last 3)  Recent Labs    01/22/21 2128 01/23/21 0734 01/23/21 1121  GLUCAP 177* 147* 163*     GFR: Estimated Creatinine Clearance: 23.9 mL/min (A) (by C-G formula based on SCr of 2.21 mg/dL (H)).  Coagulation Profile: Recent Labs  Lab 01/05/2021 1339 01/21/21 0239 01/22/21 0258 01/23/21 0248  INR 2.8* 3.1* 2.2* 1.6*    Recent Results (from the past 240 hour(s))  SARS CORONAVIRUS 2 (TAT 6-24 HRS) Nasopharyngeal Nasopharyngeal Swab     Status: None   Collection Time: 01/14/21 12:39 AM   Specimen: Nasopharyngeal Swab  Result Value Ref Range Status   SARS Coronavirus 2 NEGATIVE NEGATIVE Final    Comment: (NOTE) SARS-CoV-2 target nucleic acids are NOT DETECTED.  The SARS-CoV-2 RNA is generally detectable in upper and lower respiratory specimens during the acute phase of infection. Negative results do not preclude  SARS-CoV-2 infection, do not rule out co-infections with other pathogens, and should not be used as the sole basis for treatment or other patient management decisions. Negative results must be combined with clinical observations, patient history, and epidemiological information. The expected result is Negative.  Fact Sheet for Patients: SugarRoll.be  Fact Sheet for Healthcare Providers: https://www.woods-mathews.com/  This test is not yet approved or cleared by the [place] FDA and  has been authorized for detection and/or diagnosis of SARS-CoV-2 by FDA under an Emergency Use  Authorization (EUA). This EUA will remain  in effect (meaning this test can be used) for the duration of the COVID-19 declaration under Se ction 564(b)(1) of the Act, 21 U.S.C. section 360bbb-3(b)(1), unless the authorization is terminated or revoked sooner.  Performed at [facility], [place] [address] [place], [place] [address]   Resp Panel by RT-PCR (Flu A&B, Covid) Nasopharyngeal Swab     Status: None   Collection Time: 12/27/2020  2:05 PM   Specimen: Nasopharyngeal Swab; Nasopharyngeal(NP) swabs in vial transport medium  Result Value Ref Range Status   SARS Coronavirus 2 by RT PCR NEGATIVE NEGATIVE Final    Comment: (NOTE) SARS-CoV-2 target nucleic acids are NOT DETECTED.  The SARS-CoV-2 RNA is generally detectable in upper respiratory specimens during the acute phase of infection. The lowest concentration of SARS-CoV-2 viral copies this assay can detect is 138 copies/mL. A negative result does not preclude SARS-Cov-2 infection and should not be used as the sole basis for treatment or other patient management decisions. A negative result may occur with  improper specimen collection/handling, submission of specimen other than nasopharyngeal swab, presence of viral mutation(s) within the areas targeted by this assay, and inadequate number of viral copies(<138 copies/mL). A negative result must be combined with clinical observations, patient history, and epidemiological information. The expected result is Negative.  Fact Sheet for Patients:  EntrepreneurPulse.com.au  Fact Sheet for Healthcare Providers:  IncredibleEmployment.be  This test is no t yet approved or cleared by the [place] FDA and  has been authorized for detection and/or diagnosis of SARS-CoV-2 by FDA under an Emergency Use Authorization (EUA). This EUA will remain  in effect (meaning this test can be used) for the duration of the COVID-19 declaration under Section  564(b)(1) of the Act, 21 U.S.C.section 360bbb-3(b)(1), unless the authorization is terminated  or revoked sooner.       Influenza A by PCR NEGATIVE NEGATIVE Final   Influenza B by PCR NEGATIVE NEGATIVE Final    Comment: (NOTE) The Xpert Xpress SARS-CoV-2/FLU/RSV plus assay is intended as an aid in the diagnosis of influenza from Nasopharyngeal swab specimens and should not be used as a sole basis for treatment. Nasal washings and aspirates are unacceptable for Xpert Xpress SARS-CoV-2/FLU/RSV testing.  Fact Sheet for Patients: EntrepreneurPulse.com.au  Fact Sheet for Healthcare Providers: IncredibleEmployment.be  This test is not yet approved or cleared by the [place] FDA and has been authorized for detection and/or diagnosis of SARS-CoV-2 by FDA under an Emergency Use Authorization (EUA). This EUA will remain in effect (meaning this test can be used) for the duration of the COVID-19 declaration under Section 564(b)(1) of the Act, 21 U.S.C. section 360bbb-3(b)(1), unless the authorization is terminated or revoked.  Performed at [facility], [place] [address], [place],  [address]   Culture, blood (Routine x 2)     Status: None (Preliminary result)   Collection Time: 01/13/2021  3:28 PM   Specimen: BLOOD  Result Value  Ref Range Status   Specimen Description   Final    BLOOD RIGHT ARM Performed at [place] [address], [place], [place] [address]    Special Requests   Final    BOTTLES DRAWN AEROBIC AND ANAEROBIC Blood Culture adequate volume Performed at [place] [address] [place], [place] [address]    Culture   Final    NO GROWTH 3 DAYS Performed at [facility], [place] [address] [place], [place] [address]    Report Status PENDING  Incomplete  MRSA PCR Screening     Status: None   Collection Time: 01/11/2021  6:20 PM   Specimen: Nasopharyngeal   Result Value Ref Range Status   MRSA by PCR NEGATIVE NEGATIVE Final    Comment:        The GeneXpert MRSA Assay (FDA approved for NASAL specimens only), is one component of a comprehensive MRSA colonization surveillance program. It is not intended to diagnose MRSA infection nor to guide or monitor treatment for MRSA infections. Performed at [facility], [place] [address], [place], [place] [address]   Urine culture     Status: None   Collection Time: 12/27/2020  6:21 PM   Specimen: Urine, Random  Result Value Ref Range Status   Specimen Description   Final    URINE, RANDOM Performed at [place] [address] [place], [place] [address]    Special Requests   Final    NONE Performed at [facility], [place] [address], [place], [place] [address]    Culture   Final    NO GROWTH Performed at [facility], [place] [address], [place], [place] [address]    Report Status 01/22/2021 FINAL  Final  C Difficile Quick Screen w PCR reflex     Status: Abnormal   Collection Time: 01/21/21  3:43 PM   Specimen: STOOL  Result Value Ref Range Status   C Diff antigen POSITIVE (A) NEGATIVE Final   C Diff toxin POSITIVE (A) NEGATIVE Final   C Diff interpretation Toxin producing C. difficile detected.  Final    Comment: CRITICAL RESULT CALLED TO, READ BACK BY AND VERIFIED WITH: P.MERCADO AT 2017 ON 03.01.22 BY N.THOMPSON Performed at [facility], [place] [address], [place], [place] [address]         Radiology Studies: No results found.      Scheduled Meds: . (feeding supplement) PROSource Plus  30 mL Oral BID BM  . allopurinol  100 mg Oral Daily  . brimonidine  1 drop Both Eyes BID  . Chlorhexidine Gluconate Cloth  6 each Topical Daily  . cycloSPORINE  1 drop Both Eyes BID  . dorzolamide  1 drop Both Eyes Daily  . feeding supplement  237 mL Oral BID BM  . FLUoxetine  40 mg Oral Daily  . hydrocortisone  sod succinate (SOLU-CORTEF) inj  50 mg Intravenous Q8H  . insulin aspart  0-5 Units Subcutaneous QHS  . insulin aspart  0-9 Units Subcutaneous TID WC  . insulin glargine  5 Units Subcutaneous BID  . latanoprost  1 drop Both Eyes QHS  . mouth rinse  15 mL Mouth Rinse BID  . multivitamin with minerals  1 tablet Oral Daily  . mycophenolate  180 mg Oral BID  . nutrition supplement (JUVEN)  1 packet Oral BID BM  . sodium chloride flush  3 mL Intravenous Q12H  . tacrolimus  1 mg Oral BID  . vancomycin  125 mg Oral QID   Continuous Infusions: . ceFEPime (MAXIPIME) IV Stopped (01/22/21 1807)  . heparin 700 Units/hr (01/23/21 1337)  . metronidazole Stopped (01/23/21 0258)     LOS: 3 days     Cordelia Poche, MD [facility] 01/23/2021, 2:00 PM  If 7PM-7AM, please contact night-coverage [email or web address]

## 2021-01-23 NOTE — Progress Notes (Signed)
ANTICOAGULATION CONSULT NOTE - Follow Up Consult  Pharmacy Consult for Heparin Indication: atrial fibrillation  Allergies  Allergen Reactions  . Infed [Iron Dextran] Other (See Comments)    Chest tightness  . Pentamidine Itching, Shortness Of Breath and Swelling  . Budesonide-Formoterol Fumarate     Other reaction(s): hoarseness  . Bupropion     Other reaction(s): exacerbated depression  . Crestor [Rosuvastatin]     Other reaction(s): body aches, swelling  . Duloxetine Hcl     Other reaction(s): GI SE, weepy, worsening fatigue, foggy  . Erythromycin [Erythromycin] Other (See Comments)    Mouth Ulcers  . Iohexol Other (See Comments)    Per patient "she has had a kidney transplant and should never have contrast"  . Oxycodone Nausea Only and Nausea And Vomiting  . Pravastatin Sodium     Other reaction(s): myalgias  . Erythromycin Rash    Causes breakout in mouth  . Ultram [Tramadol Hcl] Anxiety    Patient Measurements: Height: 5\' 1"  (154.9 cm) Weight: 73.3 kg (161 lb 9.6 oz) IBW/kg (Calculated) : 47.8 Heparin Dosing Weight: 63kg  Vital Signs: Temp: 97.2 F (36.2 C) (03/03 0400) Temp Source: Bladder (03/03 0400) BP: 139/91 (03/03 0400) Pulse Rate: 82 (03/03 0400)  Labs: Recent Labs    01/19/2021 1339 12/29/2020 1339 01/21/21 0239 01/21/21 1000 01/22/21 0258 01/23/21 0248  HGB 8.5*  --  7.3*  --  7.4* 8.2*  HCT 27.1*  --  24.1*  --  23.7* 26.4*  PLT 199  --  141*  --  161 174  APTT  --    < > 133* 87* 68* 57*  LABPROT 28.9*  --  30.8*  --  23.5*  --   INR 2.8*  --  3.1*  --  2.2*  --   HEPARINUNFRC  --   --  >2.20*  --  >2.20* 2.18*  CREATININE 2.19*  --  2.09*  --  2.10* 2.21*   < > = values in this interval not displayed.    Estimated Creatinine Clearance: 23.9 mL/min (A) (by C-G formula based on SCr of 2.21 mg/dL (H)).   Medications:  Infusions:  . ceFEPime (MAXIPIME) IV Stopped (01/22/21 1807)  . heparin 550 Units/hr (01/23/21 0400)  . metronidazole  Stopped (01/23/21 0048)    Assessment: 64 yoF with PMH PAF s/p DCCV (12/25/20) on Eliquis, chronic resp fail (2L O2 baseline), CKD3 s/p renal txplant, dCHF, DM2, admitted after being found unresponsive at [facility]. Concern for infection after recent abdominal surgery; Pharmacy consulted to convert Eliquis to IV heparin while possible procedures pending. During last admission, heparin was especially difficult to control, with rates ranging from 1650 units/hr down to 250 units/hr. Patient also noted to have significant bruising and hematomas on several extremities last admission. However, Cardiology has recommended at least 30 days of anticoagulation after DCCV (through 3/4).  PTA Eliquis 5mg  BID, last dose 2/28 09:00.  Baseline INR elevated; aPTT not done  Today, 01/23/2021:  Heparin level = 2.18 (artificially elevated d/t recent DOAC use, along with AKI)  APTT 57 (subtherapeutic today) with heparin gtt @ 550 units/hr  CBC:  Hgb 8.2 (remains low/stable), Plt 174  Known bruising.  No bleeding or complications reported.  Goal of Therapy:  Heparin level 0.3-0.7 units/ml aPTT 66-102 seconds Monitor platelets by anticoagulation protocol: Yes   Plan:   Increase heparin IV infusion to 700 units/hr  Check aPTT 8 hr after heparin rate increase  Daily heparin level, aPTT and CBC  Follow up plans for IR or surgical interventions  Leone Haven, PharmD 01/23/2021 4:36 AM

## 2021-01-23 NOTE — Progress Notes (Addendum)
INR 1.6 today.  Will attempt intraabdominal aspiration and possible drain placement tomorrow when patient can be off IV heparin.   Tentatively scheduled for 1pm tomorrow.  Made npo at midnight. Stop heparin infusion 4 hours prior to the procedure.  Repeat INR in AM    [place] PA-C 01/23/2021 11:01 AM

## 2021-01-23 NOTE — Progress Notes (Signed)
CC: non-responsiveness, hypotensive  Subjective: Patient looks much better today.  She is on nasal cannula seems pretty comfortable.  Her midline incision looks better, her ostomy is working.  She is coherent and answering all questions and cooperative.  Objective: Vital signs in last 24 hours: Temp:  [97 F (36.1 C)-97.7 F (36.5 C)] 97.2 F (36.2 C) (03/03 0400) Pulse Rate:  [77-84] 82 (03/03 0600) Resp:  [14-24] 14 (03/03 0600) BP: (105-139)/(61-91) 137/72 (03/03 0600) SpO2:  [100 %] 100 % (03/03 0600) Last BM Date: 01/22/21 50 p.o. recorded 525 IV recorded Urine 400 recorded Stool 100 recorded Afebrile vital signs are stable. Sodium 129, potassium 4.0, glucose 179, BUN 53, creatinine 2.21, calcium 8.5, WBC 4.8, H/H 8.2/26.4, platelets 174,000 C. difficile positive for antigen/toxin 01/21/2021  Intake/Output from previous day: 03/02 0701 - 03/03 0700 In: 574 [P.O.:50; I.V.:126.7; IV Piggyback:397.3] Out: 500 [Urine:400; Stool:100] Intake/Output this shift: No intake/output data recorded.  General appearance: alert, cooperative and no distress Resp: clear to auscultation bilaterally GI: Soft, not overly distended.  Positive bowel sounds ostomy is working.  Midline incision continues to look better.  The midline fascial sutures are all loose.  But looks much better with adequate dressing changes and wound care.   ED original dressing 2/28  Today 01/24/20  Lab Results:  Recent Labs    01/22/21 0258 01/23/21 0248  WBC 4.4 4.8  HGB 7.4* 8.2*  HCT 23.7* 26.4*  PLT 161 174    BMET Recent Labs    01/22/21 0258 01/23/21 0248  NA 128* 129*  K 4.3 4.0  CL 101 102  CO2 17* 22  GLUCOSE 200* 179*  BUN 53* 53*  CREATININE 2.10* 2.21*  CALCIUM 8.2* 8.5*   PT/INR Recent Labs    01/22/21 0258 01/23/21 0248  LABPROT 23.5* 18.8*  INR 2.2* 1.6*    Recent Labs  Lab 01/18/2021 1339 01/22/21 0258  AST 25 20  ALT 8 9  ALKPHOS 94 80  BILITOT 1.2 1.1  PROT  6.0* 5.7*  ALBUMIN 1.9* 2.1*     Lipase     Component Value Date/Time   LIPASE 16 12/24/2020 1343     Medications: . (feeding supplement) PROSource Plus  30 mL Oral BID BM  . allopurinol  100 mg Oral Daily  . brimonidine  1 drop Both Eyes BID  . Chlorhexidine Gluconate Cloth  6 each Topical Daily  . cycloSPORINE  1 drop Both Eyes BID  . dorzolamide  1 drop Both Eyes Daily  . feeding supplement  237 mL Oral BID BM  . FLUoxetine  40 mg Oral Daily  . hydrocortisone sod succinate (SOLU-CORTEF) inj  50 mg Intravenous Q8H  . insulin aspart  0-5 Units Subcutaneous QHS  . insulin aspart  0-9 Units Subcutaneous TID WC  . insulin glargine  5 Units Subcutaneous BID  . latanoprost  1 drop Both Eyes QHS  . mouth rinse  15 mL Mouth Rinse BID  . multivitamin with minerals  1 tablet Oral Daily  . mycophenolate  180 mg Oral BID  . nutrition supplement (JUVEN)  1 packet Oral BID BM  . sodium chloride flush  3 mL Intravenous Q12H  . tacrolimus  1 mg Oral BID  . vancomycin  125 mg Oral QID   . ceFEPime (MAXIPIME) IV Stopped (01/22/21 1807)  . heparin 700 Units/hr (01/23/21 0530)  . metronidazole Stopped (01/23/21 0048)   Anti-infectives (From admission, onward)   Start     Dose/Rate  Route Frequency Ordered Stop   01/21/21 2200  vancomycin (VANCOCIN) 50 mg/mL oral solution 125 mg        125 mg Oral 4 times daily 01/21/21 2028 01/31/21 2159   01/21/21 1800  ceFEPIme (MAXIPIME) 2 g in sodium chloride 0.9 % 100 mL IVPB        2 g 200 mL/hr over 30 Minutes Intravenous Every 24 hours 01/08/2021 1742     01/21/21 0000  metroNIDAZOLE (FLAGYL) IVPB 500 mg        500 mg 100 mL/hr over 60 Minutes Intravenous Every 8 hours 12/25/2020 1821     01/19/2021 1800  vancomycin (VANCOCIN) IVPB 1000 mg/200 mL premix  Status:  Discontinued        1,000 mg 200 mL/hr over 60 Minutes Intravenous Every 48 hours 01/13/2021 1742 01/22/21 1053   01/19/2021 1545  ceFEPIme (MAXIPIME) 2 g in sodium chloride 0.9 % 100 mL IVPB         2 g 200 mL/hr over 30 Minutes Intravenous  Once 01/16/2021 1544 12/29/2020 1625   12/24/2020 1545  metroNIDAZOLE (FLAGYL) IVPB 500 mg        500 mg 100 mL/hr over 60 Minutes Intravenous  Once 01/06/2021 1544 01/07/2021 1730   12/30/2020 1545  vancomycin (VANCOCIN) IVPB 1000 mg/200 mL premix  Status:  Discontinued        1,000 mg 200 mL/hr over 60 Minutes Intravenous  Once 12/29/2020 1544 01/15/2021 1847       Assessment/Plan Polycystic disease/ESRDwith renal transplant 2013-Prograf/prednisone. Elevated Cr- 2.19>>2.09 Hx CHF Chronic COPD/history of tobacco use- On 2L PRN at home  -Mostly off CPAP now. HxAge indeterminate, non occlusive deep vein thrombosis involving the right popliteal vein Hx SVT/Atrial flutter with RVR/TEE/DC cardioversion 12/25/20 Large hematoma left arm - drained and dressed- cleaning up hematoma still present Anemia- H/H 8.5/27.1>>7.3/24.1 Severe protein calorie malnutrition-Total protein 6.0/albumin 1.9 Hyponatremia Hypercoagulable - INR 2.8 (on Eliquis) Severe malnutrition-prealbumin<5  Sepsis and 3.2 x 2.8 x 5.3 cm abdominal fluid/abscess collectionadjacent to enterotomy staple line C. difficile colitis -antigen/toxin + 01/21/2021 HxPerforated sigmoid diverticulitis -S/pExploratory laparotomy with sigmoid colectomy/end colostomy, 12/29/2020 Dr. Coralie Keens Non healing midline abdominal wound    FEN:N.p.o. GQ:QPYPPJKD/TOIZTI 2/2 8>> day 4 , Vancomycin IV 2/28 - Vancomycin PO 3/1 >> day 3 DVT:INR 2.8 >>3.1>> 1.6/ Heparin 700 units/h    Plan: Defer IV fluids to Dr. Lonny Prude.  Continue oral vancomycin and IV cefepime/Flagyl.  IR aspiration possibly later today.  INR is better she is still on IV heparin.  LOS: 3 days    Maizy Davanzo 01/23/2021 Please see Amion

## 2021-01-23 NOTE — Progress Notes (Signed)
ANTICOAGULATION CONSULT NOTE - Follow Up Consult  Pharmacy Consult for Heparin Indication: atrial fibrillation  Allergies  Allergen Reactions  . Infed [Iron Dextran] Other (See Comments)    Chest tightness  . Pentamidine Itching, Shortness Of Breath and Swelling  . Budesonide-Formoterol Fumarate     Other reaction(s): hoarseness  . Bupropion     Other reaction(s): exacerbated depression  . Crestor [Rosuvastatin]     Other reaction(s): body aches, swelling  . Duloxetine Hcl     Other reaction(s): GI SE, weepy, worsening fatigue, foggy  . Erythromycin [Erythromycin] Other (See Comments)    Mouth Ulcers  . Iohexol Other (See Comments)    Per patient "she has had a kidney transplant and should never have contrast"  . Oxycodone Nausea Only and Nausea And Vomiting  . Pravastatin Sodium     Other reaction(s): myalgias  . Erythromycin Rash    Causes breakout in mouth  . Ultram [Tramadol Hcl] Anxiety    Patient Measurements: Height: 5\' 1"  (154.9 cm) Weight: 73.3 kg (161 lb 9.6 oz) IBW/kg (Calculated) : 47.8 Heparin Dosing Weight: 63kg  Vital Signs: Temp: 97.7 F (36.5 C) (03/03 0800) Temp Source: Bladder (03/03 0800) BP: 123/61 (03/03 1200) Pulse Rate: 84 (03/03 1200)  Labs: Recent Labs    01/21/21 0239 01/21/21 1000 01/22/21 0258 01/23/21 0248 01/23/21 1304  HGB 7.3*  --  7.4* 8.2*  --   HCT 24.1*  --  23.7* 26.4*  --   PLT 141*  --  161 174  --   APTT 133*   < > 68* 57* 66*  LABPROT 30.8*  --  23.5* 18.8*  --   INR 3.1*  --  2.2* 1.6*  --   HEPARINUNFRC >2.20*  --  >2.20* 2.18*  --   CREATININE 2.09*  --  2.10* 2.21*  --    < > = values in this interval not displayed.    Estimated Creatinine Clearance: 23.9 mL/min (A) (by C-G formula based on SCr of 2.21 mg/dL (H)).   Medications:  Infusions:  . ceFEPime (MAXIPIME) IV Stopped (01/22/21 1807)  . heparin 700 Units/hr (01/23/21 1337)  . metronidazole Stopped (01/23/21 2703)    Assessment: 71 yoF with PMH  PAF s/p DCCV (12/25/20) on Eliquis, chronic resp fail (2L O2 baseline), CKD3 s/p renal txplant, dCHF, DM2, admitted after being found unresponsive at [facility]. Concern for infection after recent abdominal surgery; Pharmacy consulted to convert Eliquis to IV heparin while possible procedures pending. During last admission, heparin was especially difficult to control, with rates ranging from 1650 units/hr down to 250 units/hr. Patient also noted to have significant bruising and hematomas on several extremities last admission. However, Cardiology has recommended at least 30 days of anticoagulation after DCCV (through 3/4).  PTA Eliquis 5mg  BID, last dose 2/28 09:00.  Baseline INR elevated; aPTT not done  Today, 01/23/2021:  Heparin level = 2.18 (artificially elevated d/t recent DOAC use, along with AKI)  APTT 66 (low end of therapeutic range) after heparin increased to 700 units/hr  CBC:  Hgb 8.2 (remains low/stable), Plt 174  INR 1.6  Known bruising.  No bleeding or complications reported.  IR planning intraabdominal aspiration and possible drain placement tomorrow at 1pm  Goal of Therapy:  Heparin level 0.3-0.7 units/ml aPTT 66-102 seconds Monitor platelets by anticoagulation protocol: Yes   Plan:   Continue heparin IV infusion at 700 units/hr  Check confirmatory aPTT in 8 hr   Daily heparin level, aPTT and CBC  Heparin  to stop tomorrow at 09:00 for IR procedure at 13:00  Peggyann Juba, PharmD, BCPS Pharmacy: [phone] 01/23/2021 1:58 PM

## 2021-01-24 ENCOUNTER — Inpatient Hospital Stay (HOSPITAL_COMMUNITY): Payer: Medicare Other

## 2021-01-24 DIAGNOSIS — G9341 Metabolic encephalopathy: Secondary | ICD-10-CM | POA: Diagnosis not present

## 2021-01-24 DIAGNOSIS — I5033 Acute on chronic diastolic (congestive) heart failure: Secondary | ICD-10-CM | POA: Diagnosis not present

## 2021-01-24 DIAGNOSIS — A498 Other bacterial infections of unspecified site: Secondary | ICD-10-CM | POA: Diagnosis not present

## 2021-01-24 DIAGNOSIS — N179 Acute kidney failure, unspecified: Secondary | ICD-10-CM | POA: Diagnosis not present

## 2021-01-24 LAB — GLUCOSE, CAPILLARY
Glucose-Capillary: 222 mg/dL — ABNORMAL HIGH (ref 70–99)
Glucose-Capillary: 174 mg/dL — ABNORMAL HIGH (ref 70–99)
Glucose-Capillary: 159 mg/dL — ABNORMAL HIGH (ref 70–99)
Glucose-Capillary: 164 mg/dL — ABNORMAL HIGH (ref 70–99)

## 2021-01-24 LAB — CBC
HCT: 26.7 % — ABNORMAL LOW (ref 36.0–46.0)
Hemoglobin: 8.3 g/dL — ABNORMAL LOW (ref 12.0–15.0)
MCH: 31.2 pg (ref 26.0–34.0)
MCHC: 31.1 g/dL (ref 30.0–36.0)
MCV: 100.4 fL — ABNORMAL HIGH (ref 80.0–100.0)
Platelets: 178 10*3/uL (ref 150–400)
RBC: 2.66 MIL/uL — ABNORMAL LOW (ref 3.87–5.11)
RDW: 19.6 % — ABNORMAL HIGH (ref 11.5–15.5)
WBC: 5.3 10*3/uL (ref 4.0–10.5)
nRBC: 0 % (ref 0.0–0.2)

## 2021-01-24 LAB — APTT: aPTT: 61 seconds — ABNORMAL HIGH (ref 24–36)

## 2021-01-24 LAB — BASIC METABOLIC PANEL
Anion gap: 9 (ref 5–15)
BUN: 57 mg/dL — ABNORMAL HIGH (ref 8–23)
CO2: 18 mmol/L — ABNORMAL LOW (ref 22–32)
Calcium: 8.5 mg/dL — ABNORMAL LOW (ref 8.9–10.3)
Chloride: 104 mmol/L (ref 98–111)
Creatinine, Ser: 2.17 mg/dL — ABNORMAL HIGH (ref 0.44–1.00)
GFR, Estimated: 25 mL/min — ABNORMAL LOW (ref >60)
Glucose, Bld: 237 mg/dL — ABNORMAL HIGH (ref 70–99)
Potassium: 3.9 mmol/L (ref 3.5–5.1)
Sodium: 131 mmol/L — ABNORMAL LOW (ref 135–145)

## 2021-01-24 LAB — PROTIME-INR
INR: 1.9 — ABNORMAL HIGH (ref 0.8–1.2)
Prothrombin Time: 20.8 seconds — ABNORMAL HIGH (ref 11.4–15.2)

## 2021-01-24 LAB — HEPARIN LEVEL (UNFRACTIONATED): Heparin Unfractionated: 0.91 IU/mL — ABNORMAL HIGH (ref 0.30–0.70)

## 2021-01-24 MED ORDER — MIDAZOLAM HCL 2 MG/2ML IJ SOLN
INTRAMUSCULAR | Status: AC | PRN
  Administered 2021-01-24: 1 mg via INTRAVENOUS

## 2021-01-24 MED ORDER — LIDOCAINE-EPINEPHRINE (PF) 1 %-1:200000 IJ SOLN
INTRAMUSCULAR | Status: AC | PRN
  Administered 2021-01-24: 10 mL

## 2021-01-24 MED ORDER — FUROSEMIDE 10 MG/ML IJ SOLN
40.0000 mg | Freq: Every day | INTRAMUSCULAR | Status: DC
  Administered 2021-01-24: 40 mg via INTRAVENOUS
  Filled 2021-01-24: qty 4

## 2021-01-24 MED ORDER — HEPARIN (PORCINE) 25000 UT/250ML-% IV SOLN: 800.0000 [IU]/h | INTRAVENOUS | Status: DC

## 2021-01-24 MED ORDER — FENTANYL CITRATE (PF) 100 MCG/2ML IJ SOLN
INTRAMUSCULAR | Status: AC | PRN
  Administered 2021-01-24: 50 ug via INTRAVENOUS

## 2021-01-24 MED ORDER — FENTANYL CITRATE (PF) 100 MCG/2ML IJ SOLN
INTRAMUSCULAR | Status: AC
  Filled 2021-01-24: qty 2

## 2021-01-24 MED ORDER — MIDAZOLAM HCL 2 MG/2ML IJ SOLN
INTRAMUSCULAR | Status: AC
  Filled 2021-01-24: qty 4

## 2021-01-24 MED ORDER — PROMETHAZINE HCL 25 MG/ML IJ SOLN
12.5000 mg | Freq: Once | INTRAMUSCULAR | Status: AC
  Administered 2021-01-24: 12.5 mg via INTRAVENOUS
  Filled 2021-01-24: qty 1

## 2021-01-24 MED ORDER — SODIUM CHLORIDE 0.9% FLUSH
5.0000 mL | Freq: Three times a day (TID) | INTRAVENOUS | Status: DC
  Administered 2021-01-24 – 2021-02-03 (×27): 5 mL

## 2021-01-24 MED ORDER — LORAZEPAM 2 MG/ML IJ SOLN: 0.5000 mg | Freq: Once | INTRAMUSCULAR | Status: AC | PRN

## 2021-01-24 NOTE — Progress Notes (Signed)
MEDICATION-RELATED CONSULT NOTE   IR Procedure Consult - Anticoagulant/Antiplatelet PTA/Inpatient Med List Review by Pharmacist   Procedure: LLQ abscess drain Completed: 01/24/21 13:10 Post-Procedural bleeding risk per IR MD assessment:  Standard Antithrombotic medications on inpatient or PTA profile prior to procedure:   Therapeutic Heparin IV Recommended restart time per IR Post-Procedure Guidelines:  6 hours after  Plan:     Re-Start heparin IV infusion at 750 units/hr at 20:00 APTT 8 hours after starting Daily APTT, heparin level, and CBC   Gretta Arab PharmD, BCPS Clinical Pharmacist [facility] main pharmacy [phone] 01/24/2021 1:39 PM

## 2021-01-24 NOTE — TOC Progression Note (Signed)
Transition of Care [facility]) - Progression Note    Patient Details  Name: April [name] MRN: [id number] Date of Birth: 12-25-1955  Transition of [facility]) CM/SW Contact  Leeroy Cha, RN Phone Number: 01/24/2021, 7:53 AM  Clinical Narrative:    Assessment & Plan:   Principal Problem:   Acute metabolic encephalopathy Active Problems:   Renal transplant recipient   Depression   Acute respiratory failure with hypoxia ([place])   Acute on chronic diastolic CHF (congestive heart failure) ([place])   Acute renal failure superimposed on stage 3b chronic kidney disease ([place])   Hypotension General appearance: alert, cooperative and no distress Resp: clear to auscultation bilaterally GI: Soft, not overly distended.  Positive bowel sounds ostomy is working.  Midline incision continues to look better.  The midline fascial sutures are all loose.  But looks much better with adequate dressing changes and wound care. PLAN: home with [facility] for inscisional and dressing changes if needed.  Wide open wound to midline abd.  Expected Discharge Plan: Skilled Nursing Facility Barriers to Discharge: Continued Medical Work up  Expected Discharge Plan and Services Expected Discharge Plan: [place]   Discharge Planning Services: CM Consult Post Acute Care Choice: [place] Living arrangements for the past 2 months: Single Family Home                                       Social Determinants of Health (SDOH) Interventions    Readmission Risk Interventions Readmission Risk Prevention Plan 05/15/2020 04/19/2020 02/09/2020  Transportation Screening Complete Complete Complete  PCP or Specialist Appt within 3-5 Days - - -  Not Complete comments - - -  HRI or [place] or Home Care Consult comments - - -  Social Work Consult for [place] Planning/Counseling - - -  SW consult not completed comments - - -  Palliative Care Screening - - -   Medication Review ([place]) Referral to Pharmacy Complete Referral to Pharmacy  PCP or Specialist appointment within 3-5 days of discharge Complete Complete Complete  HRI or Home Care Consult Complete Complete Complete  SW Recovery Care/Counseling Consult Complete Complete -  Palliative Care Screening Not Applicable Not Applicable Complete  Skilled Nursing Facility Complete Complete Patient Refused  Some recent data might be hidden

## 2021-01-24 NOTE — Evaluation (Signed)
SLP Cancellation Note  Patient Details Name: April [name] MRN: [id number] DOB: [birth year]   Cancelled treatment:       Reason Eval/Treat Not Completed: Other (comment);Pain limiting ability to participate (pt npo for IR procedure- will continue efforts)   [name] 01/24/2021, 7:21 AM   Kathleen Lime, MS [facility] SLP [place] Office [phone] Pager [phone]

## 2021-01-24 NOTE — Progress Notes (Signed)
ANTICOAGULATION CONSULT NOTE - Follow Up Consult  Pharmacy Consult for Heparin Indication: atrial fibrillation  Allergies  Allergen Reactions  . Infed [Iron Dextran] Other (See Comments)    Chest tightness  . Pentamidine Itching, Shortness Of Breath and Swelling  . Budesonide-Formoterol Fumarate     Other reaction(s): hoarseness  . Bupropion     Other reaction(s): exacerbated depression  . Crestor [Rosuvastatin]     Other reaction(s): body aches, swelling  . Duloxetine Hcl     Other reaction(s): GI SE, weepy, worsening fatigue, foggy  . Erythromycin [Erythromycin] Other (See Comments)    Mouth Ulcers  . Iohexol Other (See Comments)    Per patient "she has had a kidney transplant and should never have contrast"  . Oxycodone Nausea Only and Nausea And Vomiting  . Pravastatin Sodium     Other reaction(s): myalgias  . Erythromycin Rash    Causes breakout in mouth  . Ultram [Tramadol Hcl] Anxiety    Patient Measurements: Height: 5\' 1"  (154.9 cm) Weight: 73.3 kg (161 lb 9.6 oz) IBW/kg (Calculated) : 47.8 Heparin Dosing Weight: 63kg  Vital Signs: Temp: 98.2 F (36.8 C) (03/04 0000) Temp Source: Bladder (03/04 0000) BP: 149/58 (03/04 0400) Pulse Rate: 87 (03/04 0400)  Labs: Recent Labs    01/22/21 0258 01/23/21 0248 01/23/21 1304 01/23/21 2042 01/24/21 0252  HGB 7.4* 8.2*  --   --  8.3*  HCT 23.7* 26.4*  --   --  26.7*  PLT 161 174  --   --  178  APTT 68* 57* 66* 69* 61*  LABPROT 23.5* 18.8*  --   --  20.8*  INR 2.2* 1.6*  --   --  1.9*  HEPARINUNFRC >2.20* 2.18*  --   --  0.91*  CREATININE 2.10* 2.21*  --   --  2.17*    Estimated Creatinine Clearance: 24.3 mL/min (A) (by C-G formula based on SCr of 2.17 mg/dL (H)).   Assessment: 38 yoF with PMH PAF s/p DCCV (12/25/20) on Eliquis, chronic resp fail (2L O2 baseline), CKD3 s/p renal txplant, dCHF, DM2, admitted after being found unresponsive at [facility]. Concern for infection after recent abdominal surgery; Pharmacy  consulted to convert Eliquis to IV heparin while possible procedures pending. During last admission, heparin was especially difficult to control, with rates ranging from 1650 units/hr down to 250 units/hr. Patient also noted to have significant bruising and hematomas on several extremities last admission. However, Cardiology has recommended at least 30 days of anticoagulation after DCCV (through 3/4).  PTA Eliquis 5mg  BID, last dose 2/28 09:00.  Baseline INR elevated; aPTT not done  Today, 01/24/2021:   Heparin level = 0.91 (artificially elevated d/t recent DOAC use, along with AKI)  APTT 61 (now subtherapeutic) on heparin 700 units/hr; RN reports heparin infusing without interruptions at 700 units/hr since previous level addressed  CBC:  Hgb 8.3 (remains low/stable), Plt 178  INR 1.9  Known bruising.  No bleeding or complications reported.  IR planning intraabdominal aspiration and possible drain placement tomorrow at 1pm  Goal of Therapy:  Heparin level 0.3-0.7 units/ml aPTT 66-102 seconds Monitor platelets by anticoagulation protocol: Yes   Plan:   Increase heparin to 750 units/hr until off at 0900 for IR procedure  *Check repeat aptt in 8h if IR procedure canceled with increase in INR  F/U anticoagulation plans after procedure  Netta Cedars, Pharm.D 01/24/2021 4:31 AM

## 2021-01-24 NOTE — Progress Notes (Signed)
CC: non-responsiveness, hypotensive  Subjective: No complaints today.  Tolerating her diet and eating what she is able.  Objective: Vital signs in last 24 hours: Temp:  [98.1 F (36.7 C)-98.6 F (37 C)] 98.1 F (36.7 C) (03/04 0400) Pulse Rate:  [80-90] 87 (03/04 0400) Resp:  [13-29] 18 (03/04 0400) BP: (117-151)/(54-93) 149/58 (03/04 0400) SpO2:  [100 %] 100 % (03/04 0400) Last BM Date: 01/23/21  Intake/Output from previous day: 03/03 0701 - 03/04 0700 In: 756.9 [P.O.:200; I.V.:162; IV Piggyback:394.8] Out: 1250 [Urine:1000; Stool:250] Intake/Output this shift: No intake/output data recorded.  PE: Abd: soft, NT, midline wound with one strip of necrotic fibrin connected to her fascial sutures which are no longer useful.  This was all debrided today.  Wound is essentially 100% clean except a small amount of fibrin just under the superior aspect of the wound.  There is a green tinge to her dressing.  Ostomy in place and working well with stool present   Lab Results:  Recent Labs    01/23/21 0248 01/24/21 0252  WBC 4.8 5.3  HGB 8.2* 8.3*  HCT 26.4* 26.7*  PLT 174 178    BMET Recent Labs    01/23/21 0248 01/24/21 0252  NA 129* 131*  K 4.0 3.9  CL 102 104  CO2 22 18*  GLUCOSE 179* 237*  BUN 53* 57*  CREATININE 2.21* 2.17*  CALCIUM 8.5* 8.5*   PT/INR Recent Labs    01/23/21 0248 01/24/21 0252  LABPROT 18.8* 20.8*  INR 1.6* 1.9*    Recent Labs  Lab 01/14/2021 1339 01/22/21 0258  AST 25 20  ALT 8 9  ALKPHOS 94 80  BILITOT 1.2 1.1  PROT 6.0* 5.7*  ALBUMIN 1.9* 2.1*     Lipase     Component Value Date/Time   LIPASE 16 12/29/2020 1343     Medications: . (feeding supplement) PROSource Plus  30 mL Oral BID BM  . allopurinol  100 mg Oral Daily  . brimonidine  1 drop Both Eyes BID  . Chlorhexidine Gluconate Cloth  6 each Topical Daily  . cycloSPORINE  1 drop Both Eyes BID  . dorzolamide  1 drop Both Eyes Daily  . feeding supplement  237 mL  Oral BID BM  . FLUoxetine  40 mg Oral Daily  . hydrocortisone sod succinate (SOLU-CORTEF) inj  50 mg Intravenous Q8H  . insulin aspart  0-5 Units Subcutaneous QHS  . insulin aspart  0-9 Units Subcutaneous TID WC  . insulin glargine  5 Units Subcutaneous BID  . latanoprost  1 drop Both Eyes QHS  . mouth rinse  15 mL Mouth Rinse BID  . multivitamin with minerals  1 tablet Oral Daily  . mycophenolate  180 mg Oral BID  . nutrition supplement (JUVEN)  1 packet Oral BID BM  . sodium chloride flush  3 mL Intravenous Q12H  . tacrolimus  1 mg Oral BID  . vancomycin  125 mg Oral QID   . ceFEPime (MAXIPIME) IV Stopped (01/23/21 1846)  . heparin 750 Units/hr (01/24/21 0500)  . metronidazole 500 mg (01/24/21 9983)   Anti-infectives (From admission, onward)   Start     Dose/Rate Route Frequency Ordered Stop   01/21/21 2200  vancomycin (VANCOCIN) 50 mg/mL oral solution 125 mg        125 mg Oral 4 times daily 01/21/21 2028 01/31/21 2159   01/21/21 1800  ceFEPIme (MAXIPIME) 2 g in sodium chloride 0.9 % 100 mL IVPB  2 g 200 mL/hr over 30 Minutes Intravenous Every 24 hours 01/08/2021 1742     01/21/21 0000  metroNIDAZOLE (FLAGYL) IVPB 500 mg        500 mg 100 mL/hr over 60 Minutes Intravenous Every 8 hours 01/14/2021 1821     01/18/2021 1800  vancomycin (VANCOCIN) IVPB 1000 mg/200 mL premix  Status:  Discontinued        1,000 mg 200 mL/hr over 60 Minutes Intravenous Every 48 hours 12/31/2020 1742 01/22/21 1053   01/08/2021 1545  ceFEPIme (MAXIPIME) 2 g in sodium chloride 0.9 % 100 mL IVPB        2 g 200 mL/hr over 30 Minutes Intravenous  Once 12/29/2020 1544 12/31/2020 1625   12/24/2020 1545  metroNIDAZOLE (FLAGYL) IVPB 500 mg        500 mg 100 mL/hr over 60 Minutes Intravenous  Once 01/11/2021 1544 12/24/2020 1730   01/14/2021 1545  vancomycin (VANCOCIN) IVPB 1000 mg/200 mL premix  Status:  Discontinued        1,000 mg 200 mL/hr over 60 Minutes Intravenous  Once 12/30/2020 1544 01/13/2021 1847        Assessment/Plan Polycystic disease/ESRDwith renal transplant 2013-Prograf/prednisone. Elevated Cr- 2.19>>2.09 Hx CHF Chronic COPD/history of tobacco use- On 2L PRN at home  -Mostly off CPAP now. HxAge indeterminate, non occlusive deep vein thrombosis involving the right popliteal vein Hx SVT/Atrial flutter with RVR/TEE/DC cardioversion 12/25/20 Large hematoma left arm - drained and dressed- cleaning up hematoma still present Anemia- H/H 8.5/27.1>>7.3/24.1 Severe protein calorie malnutrition-Total protein 6.0/albumin 1.9 Hyponatremia Hypercoagulable - INR 2.8 (on Eliquis) Severe malnutrition-prealbumin<5 C. difficile colitis -antigen/toxin + 01/21/2021, tx per medicine  Sepsis and 3.2 x 2.8 x 5.3 cm abdominal fluid/abscess collectionadjacent to enterotomy staple line HxPerforated sigmoid diverticulitis -S/pExploratory laparotomy with sigmoid colectomy/end colostomy, 12/29/2020 Dr. Coralie Keens  -plan for IR aspiration vs drain today at 1pm. -cont local wound care.  Will d/wMD.  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  However, her wound has fascial separation and don't really want to put too much abrasive contents on it to prevent development of EC fistulas etc.  May place mepitel over base of the wound and then put dressing over it. -ostomy stable and working well   FEN:N.p.o. for procedure VO:[id number]/KVQQVZ 2/28>> , Vancomycin IV 2/28 - Vancomycin PO 3/1 >>  DGL:OVFIEPP gtt    Plan: Defer IV fluids to Dr. Lonny Prude.  Continue oral vancomycin and IV cefepime/Flagyl.  IR aspiration possibly later today.  INR is better she is still on IV heparin.  LOS: 4 days    Henreitta Cea 01/24/2021 Please see Amion

## 2021-01-24 NOTE — Procedures (Signed)
Interventional Radiology Procedure Note  Procedure: LLQ abscess drain  Indication: LLQ abscess  Findings:  10.2 Fr pigtail drain placed in LLQ abscess Please refer to procedural dictation for full description.  Complications: None  EBL: < 10 mL  Miachel Roux, MD [phone]

## 2021-01-24 NOTE — Progress Notes (Addendum)
PROGRESS NOTE    April [name]  WHQ:[id number] DOB: 1956-01-13 DOA: 12/28/2020 PCP: [name], MD   Brief Narrative: April [name] is a 65 y.o. female with a history of atrial flutter s/p DCCV, chronic respiratory failure on 2 lpm of oxygen, asthma, possible OSA, CKD stage IIIa, ESRD s/p renal transplant, diastolic heart failure, diabetes, Raynaud's phenomenon. Patient presented secondary to acute encephalopathy with associated hypotension in setting of intraabdominal fluid collection and recent intraabdominal surgery. General surgery and IR consulted for management. Patient managed empirically on antibiotics. Found to have C. Difficile and started on Vancomycin PO.   Assessment & Plan:   Principal Problem:   Acute metabolic encephalopathy Active Problems:   Renal transplant recipient   Depression   Acute respiratory failure with hypoxia (HCC)   Acute on chronic diastolic CHF (congestive heart failure) (HCC)   Acute renal failure superimposed on stage 3b chronic kidney disease (HCC)   Hypotension   Acute metabolic encephalopathy Unsure of etiology but resolved.  Abdominal fluid collection 3.2 x 2.8 x 5.3 cm abscess/fluid collection noted on CT scan. In setting of recent surgery. General surgery consulted who recommends IR drainage. -IR recommendations: plan for aspiration today; NPO -Continue Cefepime/Flagyl  Acute on chronic respiratory failure with hypoxia Unknown etiology but back to baseline 2 Lpm of oxygen. -Continue oxygen @ 2 lpm  AKI on CKD stage IIIa History of ESRD s/p renal transplant. Currently on mycophenolate, racrolimus. Left arm fistula. Baseline creatinine of 1.1-1.5 per chart review. Creatinine of 2.19 on admission in setting of hypotension in addition to heart failure. Stabilized. -Lasix 40 mg daily -Daily BMP  Acute on chronic diastolic heart failure BNP significantly lower from baseline. Restarted lasix secondary to worsening AKI with  significant edema -Daily BMP  C. Difficile infection Mild symptoms. Positive antigen and toxin. Started on Vancomycin PO -Continue Vancomycin PO x 10 days  Acute on chronic anemia Normocytic anemia Iron deficiency anemia Acute anemia in setting of recent abdominal surgery. Currently stable. No current evidence of bleed -Daily CBC  Hypotension Patient received two 500 mL NS boluses. Does not appear to have had shock. Did not require vasopressor support. Resolved.  Acute urinary retention Unsure of etiology. Foley placed on admission -Voiding trial prior to discharge  Adrenal insufficiency Patient is on prednisone 7.5 mg daily as an outpatient. Started on stress dose steroids secondary   Typical atrial flutter Do not see mention of atrial fibrillation from last admission. Patient underwent Transesophageal Echocardiogram/cardioversion on 12/29/2020. Converted to sinus rhythm and started on Eliquis. Heparin IV started on admission. -Continue heparin IV and transition back to Eliquis pending management of above  Diabetes mellitus, type 2 Patient is on Lantus as an outpaitnet  Raynaud's On amlodipine as an outpatient which was held secondary to hypotension.  Possible OSA -Continue CPAP qhs  Hyponatremia Mild. Patient hypovolemic on admission which may be etiology. Given NS boluses. Improving with lasix. -BMP daily  History of renal transplant -Continue home mycophenolate and tacrolimus  Primary hypertension Patient is on amlodipine, metoprolol and furosemide as an outpatient which were held secondary to hypotension.  Depression Patient is on Prozac as an outpatient -Continue Prozac  Gout Patient is on colchicine and allopurinol as an outpatient -Continue allopurinol   DVT prophylaxis: Heparin IV Code Status:   Code Status: Full Code Family Communication: None at bedside. Daughter on telephone Disposition Plan: Discharge home vs SNF likely in several days pending  general surgery/IR recommendations in addition to PT recommendations  Consultants:   General surgery  Interventional radiology  Procedures:   None  Antimicrobials:  Vancomycin IV  Vancomycin PO  Flagyl  Cefepime    Subjective: No concerns today.  Objective: Vitals:   01/24/21 0000 01/24/21 0200 01/24/21 0400 01/24/21 0800  BP: (!) 142/54 (!) 141/57 (!) 149/58   Pulse: 84 86 87   Resp: 13 16 18    Temp: 98.2 F (36.8 C)  98.1 F (36.7 C) 97.6 F (36.4 C)  TempSrc: Bladder  Bladder Oral  SpO2: 100% 100% 100%   Weight:      Height:        Intake/Output Summary (Last 24 hours) at 01/24/2021 0848 Last data filed at 01/24/2021 0500 Gross per 24 hour  Intake 733.57 ml  Output 1250 ml  Net -516.43 ml   Filed Weights   01/19/2021 1855 01/21/21 0749 01/22/21 0500  Weight: 71.4 kg 74.2 kg 73.3 kg    Examination:  General exam: Appears calm and comfortable Respiratory system: Clear to auscultation. Respiratory effort normal. Cardiovascular system: S1 & S2 heard, RRR. No murmurs, rubs, gallops or clicks. Gastrointestinal system: Abdomen is nondistended, soft. No organomegaly or masses felt. Decreased bowel sounds heard. Central nervous system: Alert and oriented. No focal neurological deficits. Musculoskeletal: Total body edema. No calf tenderness Skin: No cyanosis. Multiple areas of ecchymosis Psychiatry: Judgement and insight appear normal. Mood & affect appropriate.    Data Reviewed: I have personally reviewed following labs and imaging studies  CBC Lab Results  Component Value Date   WBC 5.3 01/24/2021   RBC 2.66 (L) 01/24/2021   HGB 8.3 (L) 01/24/2021   HCT 26.7 (L) 01/24/2021   MCV 100.4 (H) 01/24/2021   MCH 31.2 01/24/2021   PLT 178 01/24/2021   MCHC 31.1 01/24/2021   RDW 19.6 (H) 01/24/2021   LYMPHSABS 0.4 (L) 01/22/2021   MONOABS 0.1 01/22/2021   EOSABS 0.0 01/22/2021   BASOSABS 0.0 69/67/8938     Last metabolic panel Lab Results   Component Value Date   NA 131 (L) 01/24/2021   K 3.9 01/24/2021   CL 104 01/24/2021   CO2 18 (L) 01/24/2021   BUN 57 (H) 01/24/2021   CREATININE 2.17 (H) 01/24/2021   GLUCOSE 237 (H) 01/24/2021   GFRNONAA 25 (L) 01/24/2021   GFRAA 51 (L) 08/05/2020   CALCIUM 8.5 (L) 01/24/2021   PHOS 4.1 01/21/2021   PROT 5.7 (L) 01/22/2021   ALBUMIN 2.1 (L) 01/22/2021   LABGLOB 3.8 11/04/2020   AGRATIO 0.9 05/31/2020   BILITOT 1.1 01/22/2021   ALKPHOS 80 01/22/2021   AST 20 01/22/2021   ALT 9 01/22/2021   ANIONGAP 9 01/24/2021    CBG (last 3)  Recent Labs    01/23/21 1605 01/23/21 2134 01/24/21 0744  GLUCAP 171* 173* 222*     GFR: Estimated Creatinine Clearance: 24.3 mL/min (A) (by C-G formula based on SCr of 2.17 mg/dL (H)).  Coagulation Profile: Recent Labs  Lab 12/28/2020 1339 01/21/21 0239 01/22/21 0258 01/23/21 0248 01/24/21 0252  INR 2.8* 3.1* 2.2* 1.6* 1.9*    Recent Results (from the past 240 hour(s))  Resp Panel by RT-PCR (Flu A&B, Covid) Nasopharyngeal Swab     Status: None   Collection Time: 12/24/2020  2:05 PM   Specimen: Nasopharyngeal Swab; Nasopharyngeal(NP) swabs in vial transport medium  Result Value Ref Range Status   SARS Coronavirus 2 by RT PCR NEGATIVE NEGATIVE Final    Comment: (NOTE) SARS-CoV-2 target nucleic acids are NOT DETECTED.  The  SARS-CoV-2 RNA is generally detectable in upper respiratory specimens during the acute phase of infection. The lowest concentration of SARS-CoV-2 viral copies this assay can detect is 138 copies/mL. A negative result does not preclude SARS-Cov-2 infection and should not be used as the sole basis for treatment or other patient management decisions. A negative result may occur with  improper specimen collection/handling, submission of specimen other than nasopharyngeal swab, presence of viral mutation(s) within the areas targeted by this assay, and inadequate number of viral copies(<138 copies/mL). A negative result  must be combined with clinical observations, patient history, and epidemiological information. The expected result is Negative.  Fact Sheet for Patients:  EntrepreneurPulse.com.au  Fact Sheet for Healthcare Providers:  IncredibleEmployment.be  This test is no t yet approved or cleared by the [place] FDA and  has been authorized for detection and/or diagnosis of SARS-CoV-2 by FDA under an Emergency Use Authorization (EUA). This EUA will remain  in effect (meaning this test can be used) for the duration of the COVID-19 declaration under Section 564(b)(1) of the Act, 21 U.S.C.section 360bbb-3(b)(1), unless the authorization is terminated  or revoked sooner.       Influenza A by PCR NEGATIVE NEGATIVE Final   Influenza B by PCR NEGATIVE NEGATIVE Final    Comment: (NOTE) The Xpert Xpress SARS-CoV-2/FLU/RSV plus assay is intended as an aid in the diagnosis of influenza from Nasopharyngeal swab specimens and should not be used as a sole basis for treatment. Nasal washings and aspirates are unacceptable for Xpert Xpress SARS-CoV-2/FLU/RSV testing.  Fact Sheet for Patients: EntrepreneurPulse.com.au  Fact Sheet for Healthcare Providers: IncredibleEmployment.be  This test is not yet approved or cleared by the [place] FDA and has been authorized for detection and/or diagnosis of SARS-CoV-2 by FDA under an Emergency Use Authorization (EUA). This EUA will remain in effect (meaning this test can be used) for the duration of the COVID-19 declaration under Section 564(b)(1) of the Act, 21 U.S.C. section 360bbb-3(b)(1), unless the authorization is terminated or revoked.  Performed at [facility], [place] [address], [place], [place] [address]   Culture, blood (Routine x 2)     Status: None (Preliminary result)   Collection Time: 01/08/2021  3:28 PM   Specimen: BLOOD  Result Value Ref Range  Status   Specimen Description   Final    BLOOD RIGHT ARM Performed at [facility] [address], [place], [place] [address]    Special Requests   Final    BOTTLES DRAWN AEROBIC AND ANAEROBIC Blood Culture adequate volume Performed at [facility] [address] [place], [place] [address]    Culture   Final    NO GROWTH 4 DAYS Performed at [facility], [place] [address] [place], [place] [address]    Report Status PENDING  Incomplete  MRSA PCR Screening     Status: None   Collection Time: 01/16/2021  6:20 PM   Specimen: Nasopharyngeal  Result Value Ref Range Status   MRSA by PCR NEGATIVE NEGATIVE Final    Comment:        The GeneXpert MRSA Assay (FDA approved for NASAL specimens only), is one component of a comprehensive MRSA colonization surveillance program. It is not intended to diagnose MRSA infection nor to guide or monitor treatment for MRSA infections. Performed at [facility], [place] [address], [place], [place] [address]   Urine culture     Status: None   Collection Time: 01/03/2021  6:21 PM   Specimen: Urine, Random  Result Value Ref Range Status   Specimen Description   Final    URINE, RANDOM Performed at [facility] [address], [place], [place] [address]    Special Requests   Final    NONE Performed at [facility], [place] [address], [place], [place] [address]    Culture   Final    NO GROWTH Performed at [facility], [place] [address] [place], [place] [address]    Report Status 01/22/2021 FINAL  Final  C Difficile Quick Screen w PCR reflex     Status: Abnormal   Collection Time: 01/21/21  3:43 PM   Specimen: STOOL  Result Value Ref Range Status   C Diff antigen POSITIVE (A) NEGATIVE Final   C Diff toxin POSITIVE (A) NEGATIVE Final   C Diff interpretation Toxin producing C. difficile detected.  Final    Comment: CRITICAL RESULT CALLED TO, READ  BACK BY AND VERIFIED WITH: P.MERCADO AT 2017 ON 03.01.22 BY N.THOMPSON Performed at [facility], [place] [address], [place], [place] [address]         Radiology Studies: No results found.      Scheduled Meds: . (feeding supplement) PROSource Plus  30 mL Oral BID BM  . allopurinol  100 mg Oral Daily  . brimonidine  1 drop Both Eyes BID  . Chlorhexidine Gluconate Cloth  6 each Topical Daily  . cycloSPORINE  1 drop Both Eyes BID  . dorzolamide  1 drop Both Eyes Daily  . feeding supplement  237 mL Oral BID BM  . FLUoxetine  40 mg Oral Daily  . hydrocortisone sod succinate (SOLU-CORTEF) inj  50 mg Intravenous Q8H  . insulin aspart  0-5 Units Subcutaneous QHS  . insulin aspart  0-9 Units Subcutaneous TID WC  . insulin glargine  5 Units Subcutaneous BID  . latanoprost  1 drop Both Eyes QHS  . mouth rinse  15 mL Mouth Rinse BID  . multivitamin with minerals  1 tablet Oral Daily  . mycophenolate  180 mg Oral BID  . nutrition supplement (JUVEN)  1 packet Oral BID BM  . sodium chloride flush  3 mL Intravenous Q12H  . tacrolimus  1 mg Oral BID  . vancomycin  125 mg Oral QID   Continuous Infusions: . ceFEPime (MAXIPIME) IV Stopped (01/23/21 1846)  . heparin 750 Units/hr (01/24/21 0500)  . metronidazole 500 mg (01/24/21 7482)     LOS: 4 days     Cordelia Poche, MD [facility] 01/24/2021, 8:48 AM  If 7PM-7AM, please contact night-coverage [email or web address]

## 2021-01-24 NOTE — Progress Notes (Signed)
Back at ICU post drain procedure

## 2021-01-25 DIAGNOSIS — A498 Other bacterial infections of unspecified site: Secondary | ICD-10-CM | POA: Diagnosis not present

## 2021-01-25 DIAGNOSIS — I5033 Acute on chronic diastolic (congestive) heart failure: Secondary | ICD-10-CM | POA: Diagnosis not present

## 2021-01-25 DIAGNOSIS — N179 Acute kidney failure, unspecified: Secondary | ICD-10-CM | POA: Diagnosis not present

## 2021-01-25 DIAGNOSIS — G9341 Metabolic encephalopathy: Secondary | ICD-10-CM | POA: Diagnosis not present

## 2021-01-25 LAB — CBC
HCT: 27.7 % — ABNORMAL LOW (ref 36.0–46.0)
Hemoglobin: 8.5 g/dL — ABNORMAL LOW (ref 12.0–15.0)
MCH: 31.1 pg (ref 26.0–34.0)
MCHC: 30.7 g/dL (ref 30.0–36.0)
MCV: 101.5 fL — ABNORMAL HIGH (ref 80.0–100.0)
Platelets: 158 10*3/uL (ref 150–400)
RBC: 2.73 MIL/uL — ABNORMAL LOW (ref 3.87–5.11)
RDW: 19.4 % — ABNORMAL HIGH (ref 11.5–15.5)
WBC: 4.5 10*3/uL (ref 4.0–10.5)
nRBC: 0 % (ref 0.0–0.2)

## 2021-01-25 LAB — CULTURE, BLOOD (ROUTINE X 2)
Culture: NO GROWTH
Special Requests: ADEQUATE

## 2021-01-25 LAB — BASIC METABOLIC PANEL
Anion gap: 11 (ref 5–15)
BUN: 53 mg/dL — ABNORMAL HIGH (ref 8–23)
CO2: 17 mmol/L — ABNORMAL LOW (ref 22–32)
Calcium: 9 mg/dL (ref 8.9–10.3)
Chloride: 105 mmol/L (ref 98–111)
Creatinine, Ser: 2 mg/dL — ABNORMAL HIGH (ref 0.44–1.00)
GFR, Estimated: 28 mL/min — ABNORMAL LOW (ref >60)
Glucose, Bld: 217 mg/dL — ABNORMAL HIGH (ref 70–99)
Potassium: 3.8 mmol/L (ref 3.5–5.1)
Sodium: 133 mmol/L — ABNORMAL LOW (ref 135–145)

## 2021-01-25 LAB — GLUCOSE, CAPILLARY
Glucose-Capillary: 198 mg/dL — ABNORMAL HIGH (ref 70–99)
Glucose-Capillary: 206 mg/dL — ABNORMAL HIGH (ref 70–99)
Glucose-Capillary: 210 mg/dL — ABNORMAL HIGH (ref 70–99)
Glucose-Capillary: 268 mg/dL — ABNORMAL HIGH (ref 70–99)

## 2021-01-25 LAB — APTT
aPTT: 56 seconds — ABNORMAL HIGH (ref 24–36)
aPTT: 56 seconds — ABNORMAL HIGH (ref 24–36)

## 2021-01-25 LAB — HEPARIN LEVEL (UNFRACTIONATED)
Heparin Unfractionated: 0.61 IU/mL (ref 0.30–0.70)
Heparin Unfractionated: 0.53 IU/mL (ref 0.30–0.70)

## 2021-01-25 MED ORDER — HEPARIN (PORCINE) 25000 UT/250ML-% IV SOLN
900.0000 [IU]/h | INTRAVENOUS | Status: DC
  Administered 2021-01-25: 17:00:00 800 [IU]/h via INTRAVENOUS
  Administered 2021-01-26 – 2021-01-30 (×4): 900 [IU]/h via INTRAVENOUS
  Filled 2021-01-25 (×7): qty 250

## 2021-01-25 MED ORDER — FUROSEMIDE 10 MG/ML IJ SOLN
80.0000 mg | Freq: Two times a day (BID) | INTRAMUSCULAR | Status: DC
  Administered 2021-01-25 (×2): 80 mg via INTRAVENOUS
  Filled 2021-01-25 (×2): qty 8

## 2021-01-25 NOTE — Progress Notes (Signed)
ANTICOAGULATION CONSULT NOTE - Follow Up Consult  Pharmacy Consult for Heparin Indication: atrial fibrillation  Allergies  Allergen Reactions  . Infed [Iron Dextran] Other (See Comments)    Chest tightness  . Pentamidine Itching, Shortness Of Breath and Swelling  . Budesonide-Formoterol Fumarate     Other reaction(s): hoarseness  . Bupropion     Other reaction(s): exacerbated depression  . Crestor [Rosuvastatin]     Other reaction(s): body aches, swelling  . Duloxetine Hcl     Other reaction(s): GI SE, weepy, worsening fatigue, foggy  . Erythromycin [Erythromycin] Other (See Comments)    Mouth Ulcers  . Iohexol Other (See Comments)    Per patient "she has had a kidney transplant and should never have contrast"  . Oxycodone Nausea Only and Nausea And Vomiting  . Pravastatin Sodium     Other reaction(s): myalgias  . Erythromycin Rash    Causes breakout in mouth  . Ultram [Tramadol Hcl] Anxiety    Patient Measurements: Height: 5\' 1"  (154.9 cm) Weight: 73.3 kg (161 lb 9.6 oz) IBW/kg (Calculated) : 47.8 Heparin Dosing Weight: 63kg  Vital Signs: Temp: 98.5 F (36.9 C) (03/05 0400) Temp Source: Axillary (03/05 0400) BP: 164/68 (03/05 0200) Pulse Rate: 84 (03/05 0200)  Labs: Recent Labs    01/23/21 0248 01/23/21 1304 01/23/21 2042 01/24/21 0252 01/25/21 0523  HGB 8.2*  --   --  8.3* 8.5*  HCT 26.4*  --   --  26.7* 27.7*  PLT 174  --   --  178 158  APTT 57* 66* 69* 61*  --   LABPROT 18.8*  --   --  20.8*  --   INR 1.6*  --   --  1.9*  --   HEPARINUNFRC 2.18*  --   --  0.91* 0.61  CREATININE 2.21*  --   --  2.17*  --     Estimated Creatinine Clearance: 24.3 mL/min (A) (by C-G formula based on SCr of 2.17 mg/dL (H)).   Assessment: 2 yoF with PMH PAF s/p DCCV (12/25/20) on Eliquis, chronic resp fail (2L O2 baseline), CKD3 s/p renal txplant, dCHF, DM2, admitted after being [facility] un[facility] at [facility]. Concern for infection after recent abdominal surgery; Pharmacy  consulted to convert Eliquis to IV heparin while possible procedures pending. During last admission, heparin was especially difficult to control, with rates ranging from 1650 units/hr down to 250 units/hr. Patient also noted to have significant bruising and hematomas on several extremities last admission. However, Cardiology has recommended at least 30 days of anticoagulation after DCCV (through 3/4).  PTA Eliquis 5mg  BID, last dose 2/28 09:00.  Baseline INR elevated; aPTT not done  3/4: s/p LLQ abscess drain.  Heparin off 0900-2130.  Resumed without bolus.   Today, 01/25/2021:   Heparin level = 0.61 (artificially elevated d/t recent DOAC use, along with AKI)  APTT 56 sec on heparin 750 units/hr; RN reports heparin infusing without interruptions  CBC:  Hgb 8.5 (remains low/stable), Plt 158  INR 1.9  Known bruising.  No bleeding or complications reported.  Goal of Therapy:  Heparin level 0.3-0.7 units/ml aPTT 66-102 seconds Monitor platelets by anticoagulation protocol: Yes   Plan:   Increase heparin to 800 units/hr   Repeat aptt in 8h after rate increase  Monitor for s/sx of bleeding  Netta Cedars, Pharm.D 01/25/2021 5:54 AM

## 2021-01-25 NOTE — Progress Notes (Signed)
ANTICOAGULATION CONSULT NOTE - Follow Up Consult  Pharmacy Consult for Heparin Indication: atrial fibrillation  Allergies  Allergen Reactions  . Infed [Iron Dextran] Other (See Comments)    Chest tightness  . Pentamidine Itching, Shortness Of Breath and Swelling  . Budesonide-Formoterol Fumarate     Other reaction(s): hoarseness  . Bupropion     Other reaction(s): exacerbated depression  . Crestor [Rosuvastatin]     Other reaction(s): body aches, swelling  . Duloxetine Hcl     Other reaction(s): GI SE, weepy, worsening fatigue, foggy  . Erythromycin [Erythromycin] Other (See Comments)    Mouth Ulcers  . Iohexol Other (See Comments)    Per patient "she has had a kidney transplant and should never have contrast"  . Oxycodone Nausea Only and Nausea And Vomiting  . Pravastatin Sodium     Other reaction(s): myalgias  . Erythromycin Rash    Causes breakout in mouth  . Ultram [Tramadol Hcl] Anxiety    Patient Measurements: Height: 5\' 1"  (154.9 cm) Weight: 73.3 kg (161 lb 9.6 oz) IBW/kg (Calculated) : 47.8 Heparin Dosing Weight: 63kg  Vital Signs: Temp: 99.1 F (37.3 C) (03/05 1200) Temp Source: Axillary (03/05 1200) BP: 157/90 (03/05 1400) Pulse Rate: 95 (03/05 1400)  Labs: Recent Labs    01/23/21 0248 01/23/21 1304 01/24/21 0252 01/25/21 0523 01/25/21 1458  HGB 8.2*  --  8.3* 8.5*  --   HCT 26.4*  --  26.7* 27.7*  --   PLT 174  --  178 158  --   APTT 57*   < > 61* 56* 56*  LABPROT 18.8*  --  20.8*  --   --   INR 1.6*  --  1.9*  --   --   HEPARINUNFRC 2.18*  --  0.91* 0.61  --   CREATININE 2.21*  --  2.17* 2.00*  --    < > = values in this interval not displayed.    Estimated Creatinine Clearance: 26.4 mL/min (A) (by C-G formula based on SCr of 2 mg/dL (H)).   Assessment: 77 yoF with PMH PAF s/p DCCV (12/25/20) on Eliquis, chronic resp fail (2L O2 baseline), CKD3 s/p renal txplant, dCHF, DM2, admitted after being found unresponsive at [facility]. Concern for  infection after recent abdominal surgery; Pharmacy consulted to convert Eliquis to IV heparin while possible procedures pending. During last admission, heparin was especially difficult to control, with rates ranging from 1650 units/hr down to 250 units/hr. Patient also noted to have significant bruising and hematomas on several extremities last admission. However, Cardiology has recommended at least 30 days of anticoagulation after DCCV (through 3/4).  PTA Eliquis 5mg  BID, last dose 2/28 09:00.  Baseline INR elevated; aPTT not done  3/4: s/p LLQ abscess drain.  Heparin off 0900-2130.  Resumed without bolus.   Today, 01/25/2021:   Heparin level = 0.53 (artificially elevated d/t recent DOAC use, along with AKI)  APTT 56 sec on heparin 800 units/hr; RN reports heparin infusing without interruptions - RN did note rate was not increased until 0800, making level closer to 6 hrs from rate increase  CBC: Hgb 8.5 (remains low/stable), Plt 158  INR 1.9 (3/4)  Known bruising.  No bleeding or complications reported.  Goal of Therapy:  Heparin level 0.3-0.7 units/ml aPTT 66-102 seconds Monitor platelets by anticoagulation protocol: Yes   Plan:   Increase heparin to 950 units/hr - level drawn slightly early, but with no change in aPTT whatsoever, will assume 2 more hrs would not  have made much difference  Repeat aptt 8h after rate increase  Daily CBC and heparin level; aPTT as needed  Monitor for s/sx of bleeding  Reuel Boom, PharmD, BCPS [phone] 01/25/2021, 4:12 PM

## 2021-01-25 NOTE — Progress Notes (Signed)
Referring Physician(s): Wilson,E  Supervising Physician: Ruthann Cancer  Patient Status:  [facility] - In-pt  Chief Complaint:  Abdominal pain/ fluid collection/abscess  Subjective: Pt and family member currently asleep in room; no acute changes   Allergies: Infed [iron dextran], Pentamidine, Budesonide-formoterol fumarate, Bupropion, Crestor [rosuvastatin], Duloxetine hcl, Erythromycin [erythromycin], Iohexol, Oxycodone, Pravastatin sodium, Erythromycin, and Ultram [tramadol hcl]  Medications: Prior to Admission medications   Medication Sig Start Date End Date Taking? Authorizing Provider  acetaminophen (TYLENOL) 325 MG tablet Take 2 tablets (650 mg total) by mouth every 6 (six) hours as needed for mild pain (or Fever >/= 101). 05/22/20  Yes Elgergawy, Silver Huguenin, MD  allopurinol (ZYLOPRIM) 100 MG tablet Take 100 mg by mouth daily. 11/29/20  Yes [provider]  amLODipine (NORVASC) 5 MG tablet Take 1 tablet (5 mg total) by mouth daily. 01/15/21  Yes Mercy Riding, MD  apixaban (ELIQUIS) 5 MG TABS tablet Take 1 tablet (5 mg total) by mouth 2 (two) times daily. 01/14/21  Yes Mercy Riding, MD  brimonidine (ALPHAGAN P) 0.1 % SOLN Place 1 drop into both eyes 2 (two) times daily.    Yes [provider]  Budeson-Glycopyrrol-Formoterol (BREZTRI AEROSPHERE) 160-9-4.8 MCG/ACT AERO Inhale 2 puffs into the lungs 2 (two) times daily. 08/20/20  Yes Martyn Ehrich, NP  cholecalciferol (VITAMIN D3) 25 MCG (1000 UNIT) tablet Take 1 tablet (1,000 Units total) by mouth daily. 01/04/20  Yes Angiulli, Lavon Paganini, PA-C  colchicine 0.6 MG tablet Take 0.5 tablets (0.3 mg total) by mouth daily. 01/15/21  Yes Mercy Riding, MD  cyanocobalamin 1000 MCG tablet Take 1,000 mcg by mouth daily.   Yes [provider]  cycloSPORINE (RESTASIS) 0.05 % ophthalmic emulsion Place 1 drop into both eyes 2 (two) times daily. 01/04/20  Yes Angiulli, Lavon Paganini, PA-C  dorzolamide (TRUSOPT) 2 % ophthalmic  solution Place 1 drop into both eyes daily. 06/25/19  Yes [provider]  ferrous sulfate 325 (65 FE) MG tablet Take 325 mg by mouth daily with breakfast.   Yes [provider]  FLUoxetine (PROZAC) 40 MG capsule Take 1 capsule (40 mg total) by mouth daily. 01/04/20  Yes Angiulli, Lavon Paganini, PA-C  furosemide (LASIX) 40 MG tablet Take 1 tablet (40 mg total) by mouth 2 (two) times daily. 01/14/21  Yes Mercy Riding, MD  gabapentin (NEURONTIN) 100 MG capsule Take 200 mg by mouth 2 (two) times daily.   Yes [provider]  Glucerna (GLUCERNA) LIQD Take 237 mLs by mouth 2 (two) times daily between meals.   Yes [provider]  guaiFENesin (MUCINEX) 600 MG 12 hr tablet Take 600 mg by mouth 2 (two) times daily.   Yes [provider]  HYDROcodone-acetaminophen (NORCO/VICODIN) 5-325 MG tablet Take 1 tablet by mouth every 12 (twelve) hours as needed for moderate pain.   Yes [provider]  insulin aspart (NOVOLOG) 100 UNIT/ML injection Inject 0-9 Units into the skin 3 (three) times daily with meals. CBG 70 - 120: 0 units CBG 121 - 150: 1 unit CBG 151 - 200: 2 units CBG 201 - 250: 3 units CBG 251 - 300: 5 units CBG 301 - 350: 7 units CBG 351 - 400: 9 units 01/14/21  Yes Gonfa, Taye T, MD  insulin glargine (LANTUS) 100 UNIT/ML injection Inject 0.05 mLs (5 Units total) into the skin 2 (two) times daily. 01/14/21  Yes Mercy Riding, MD  metoprolol tartrate (LOPRESSOR) 25 MG tablet Take 1  tablet (25 mg total) by mouth 2 (two) times daily. 01/14/21  Yes Mercy Riding, MD  mycophenolate (MYFORTIC) 180 MG EC tablet Take 180 mg by mouth 2 (two) times daily.   Yes [provider]  Nutritional Supplements (,FEEDING SUPPLEMENT, PROSOURCE PLUS) liquid Take 30 mLs by mouth 2 (two) times daily between meals. 01/14/21  Yes Mercy Riding, MD  ondansetron (ZOFRAN) 4 MG tablet Take 1 tablet (4 mg total) by mouth every 8 (eight) hours as needed for nausea or vomiting.  01/14/21 01/14/22 Yes Mercy Riding, MD  pantoprazole (PROTONIX) 40 MG tablet Take 1 tablet (40 mg total) by mouth daily. 01/15/21  Yes Mercy Riding, MD  predniSONE (DELTASONE) 2.5 MG tablet Take 3 tablets (7.5 mg total) by mouth daily with breakfast. 01/15/21  Yes Gonfa, Taye T, MD  PROGRAF 1 MG capsule Take 1 mg by mouth 2 (two) times daily.  03/22/19  Yes [provider]  sodium hypochlorite (DAKIN'S 1/4 STRENGTH) 0.125 % SOLN Irrigate with as directed 2 (two) times daily. 01/14/21  Yes Gonfa, Charlesetta Ivory, MD  TRAVATAN Z 0.004 % SOLN ophthalmic solution Place 1 drop into both eyes at bedtime. 07/03/16  Yes [provider]  valACYclovir (VALTREX) 500 MG tablet Take 500 mg by mouth 2 (two) times daily.   Yes [provider]  albuterol (VENTOLIN HFA) 108 (90 Base) MCG/ACT inhaler Inhale 2 puffs into the lungs every 4 (four) hours as needed for wheezing or shortness of breath. 01/04/20   Angiulli, Lavon Paganini, PA-C  ipratropium-albuterol (DUONEB) 0.5-2.5 (3) MG/3ML SOLN Take 3 mLs by nebulization every 6 (six) hours as needed (sob/wheezing).  08/24/20   [provider]  promethazine (PHENERGAN) 25 MG tablet Take 1 tablet (25 mg total) by mouth every 8 (eight) hours as needed for up to 15 doses for nausea or vomiting. 09/02/20   Curatolo, Adam, DO     Vital Signs: BP (!) 158/82   Pulse 90   Temp 99.1 F (37.3 C) (Axillary)   Resp 19   Ht 5\' 1"  (1.549 m)   Wt 161 lb 9.6 oz (73.3 kg)   SpO2 100%   BMI 30.53 kg/m   Physical Exam pt asleep; lower abd drain intact , insertion site ok, OP 30 cc turbid chocolate colored/light brown fluid; drain flushed without difficulty or sig return of fluid  Imaging: CT IMAGE GUIDED DRAINAGE BY PERCUTANEOUS CATHETER  Result Date: 01/24/2021 INDICATION: 65 year old woman with left lower quadrant abscess presents to interventional radiology for CT-guided drain placement. EXAM: CT GUIDED DRAINAGE OF LEFT LOWER QUADRANT ABSCESS MEDICATIONS: The  patient is currently admitted to the hospital and receiving intravenous antibiotics. The antibiotics were administered within an appropriate time frame prior to the initiation of the procedure. ANESTHESIA/SEDATION: 1 mg IV Versed 50 mcg IV Fentanyl Moderate Sedation Time:  11 minutes The patient was continuously monitored during the procedure by the interventional radiology nurse under my direct supervision. COMPLICATIONS: None immediate. TECHNIQUE: Informed written consent was obtained from the patient after a thorough discussion of the procedural risks, benefits and alternatives. All questions were addressed. Maximal Sterile Barrier Technique was utilized including caps, mask, sterile gowns, sterile gloves, sterile drape, hand hygiene and skin antiseptic. A timeout was performed prior to the initiation of the procedure. PROCEDURE: Patient positioned supine on the procedure table. The left lower quadrant anterior abdominal wall skin was prepped with Chlorhexidine in a sterile fashion, and a sterile drape was applied covering the operative field. A  sterile gown and sterile gloves were used for the procedure. Local anesthesia was provided with 1% Lidocaine. Following local lidocaine administration, 18 gauge needle was advanced into the left lower quadrant collection utilizing CT guidance. Purulent material was noted to return from the needle. The needle was removed over 0.035 inch guidewire. 10.2 [place] multipurpose pigtail drain inserted over the guidewire. 30 mL of purulent material aspirated. Samples sent for Gram stain and culture. Drain secured to skin with suture and connected to bulb suction. FINDINGS: Left lower quadrant abscess IMPRESSION: 10.2 [place] multipurpose pigtail drain placed in left lower quadrant abscess utilizing CT guidance. Electronically Signed   By: Miachel Roux M.D.   On: 01/24/2021 14:31    Labs:  CBC: Recent Labs    01/22/21 0258 01/23/21 0248 01/24/21 0252 01/25/21 0523  WBC  4.4 4.8 5.3 4.5  HGB 7.4* 8.2* 8.3* 8.5*  HCT 23.7* 26.4* 26.7* 27.7*  PLT 161 174 178 158    COAGS: Recent Labs    01/21/21 0239 01/21/21 1000 01/22/21 0258 01/23/21 0248 01/23/21 1304 01/23/21 2042 01/24/21 0252 01/25/21 0523  INR 3.1*  --  2.2* 1.6*  --   --  1.9*  --   APTT 133*   < > 68* 57* 66* 69* 61* 56*   < > = values in this interval not displayed.    BMP: Recent Labs    07/01/20 2334 07/02/20 0030 07/03/20 0350 07/18/20 1545 08/05/20 1523 09/02/20 2049 01/22/21 0258 01/23/21 0248 01/24/21 0252 01/25/21 0523  NA 135   < > 139 131* 140   < > 128* 129* 131* 133*  K 4.4   < > 5.1 4.7 4.5   < > 4.3 4.0 3.9 3.8  CL 102   < > 108 95* 106   < > 101 102 104 105  CO2 25  --  20* 24 27   < > 17* 22 18* 17*  GLUCOSE 135*   < > 187* 187* 189*   < > 200* 179* 237* 217*  BUN 28*   < > 42* 30* 45*   < > 53* 53* 57* 53*  CALCIUM 9.7  --  9.0 9.4 10.1   < > 8.2* 8.5* 8.5* 9.0  CREATININE 1.02*   < > 1.17* 1.39* 1.30*   < > 2.10* 2.21* 2.17* 2.00*  GFRNONAA 58*  --  50* 40* 44*   < > 26* 24* 25* 28*  GFRAA >60  --  57* 47* 51*  --   --   --   --   --    < > = values in this interval not displayed.    LIVER FUNCTION TESTS: Recent Labs    01/03/21 0300 01/04/21 0556 01/08/21 1021 01/09/21 0511 01/12/21 0250 01/13/21 0049 12/26/2020 1339 01/22/21 0258  BILITOT 1.4*  --  1.5*  --   --   --  1.2 1.1  AST 11*  --  10*  --   --   --  25 20  ALT 11  --  8  --   --   --  8 9  ALKPHOS 53  --  62  --   --   --  94 80  PROT 5.6*  --  5.4*  --   --   --  6.0* 5.7*  ALBUMIN 2.3*   < > 2.3*   < > 1.9* 2.0* 1.9* 2.1*   < > = values in this interval not displayed.  Assessment and Plan: Pt with hx perf sig diverticulitis, s/p expl lap with sig colectomy/end colostomy 12/29/20; post op left lower abd fluid coll/abscess, s/p drain placement 3/4; c diff +; temp 99.1, WBC nl; hgb stable; creat 2.00(2.17), drain fl cx pend; cont current tx/drain irrigation/OP-lab monitoring; check  f/u CT once OP minimal(also may need drain injection before removal) or if WBC increases/clinical status worsens; other plans as per CCS/TRH   Electronically Signed: D. Rowe Robert, PA-C 01/25/2021, 1:08 PM   I spent a total of 15 minutes at the the patient's bedside AND on the patient's hospital floor or unit, greater than 50% of which was counseling/coordinating care for left lower abdominal abscess drain    Patient ID: April [name], female   DOB: [birth year], 65 y.o.   MRN: [id number]

## 2021-01-25 NOTE — Progress Notes (Signed)
PROGRESS NOTE    April [name]  YHC:[id number] DOB: 11-Aug-1956 DOA: 12/27/2020 PCP: [name], MD   Brief Narrative: April [name] is a 65 y.o. female with a history of atrial flutter s/p DCCV, chronic respiratory failure on 2 lpm of oxygen, asthma, possible OSA, CKD stage IIIa, ESRD s/p renal transplant, diastolic heart failure, diabetes, Raynaud's phenomenon. Patient presented secondary to acute encephalopathy with associated hypotension in setting of intraabdominal fluid collection and recent intraabdominal surgery. General surgery and IR consulted for management. Patient managed empirically on antibiotics. Found to have C. Difficile and started on Vancomycin PO.   Assessment & Plan:   Principal Problem:   Acute metabolic encephalopathy Active Problems:   Renal transplant recipient   Depression   Acute respiratory failure with hypoxia (HCC)   Acute on chronic diastolic CHF (congestive heart failure) (HCC)   Acute renal failure superimposed on stage 3b chronic kidney disease (HCC)   Hypotension   Acute metabolic encephalopathy Unsure of etiology but resolved.  Abdominal fluid collection 3.2 x 2.8 x 5.3 cm abscess noted on CT scan. In setting of recent surgery. General surgery consulted who recommends IR drainage. Aspiration and drain placement performed by IR on 3/4. Preliminary culture data is significant for multiple organisms on gram stain -Continue Cefepime/Flagyl -Follow-up abscess culture data  Acute on chronic respiratory failure with hypoxia Unknown etiology but back to baseline 2 Lpm of oxygen. -Continue oxygen @ 2 lpm  AKI on CKD stage IIIa History of ESRD s/p renal transplant. Currently on mycophenolate, racrolimus. Left arm fistula. Baseline creatinine of 1.1-1.5 per chart review. Creatinine of 2.19 on admission in setting of hypotension in addition to heart failure. Improving with diuresis. -Lasix as mentioned below -Daily BMP  Acute on chronic  diastolic heart failure BNP significantly lower from baseline. Restarted lasix secondary to worsening AKI with significant edema -Daily BMP -Increase to Lasix 80 mg IV BID -Daily weights and strict in/out  C. Difficile infection Mild symptoms. Positive antigen and toxin. Started on Vancomycin PO -Continue Vancomycin PO x 10 days  Acute on chronic anemia Normocytic anemia Iron deficiency anemia Acute anemia in setting of recent abdominal surgery. Currently stable. No current evidence of bleed -Daily CBC  Hypotension Patient received two 500 mL NS boluses. Does not appear to have had shock. Did not require vasopressor support. Resolved.  Acute urinary retention Unsure of etiology. Foley placed on admission -Voiding trial prior to discharge; will await until IV diuresis complete  Adrenal insufficiency Patient is on prednisone 7.5 mg daily as an outpatient. Started on stress dose steroids secondary   Typical atrial flutter Do not see mention of atrial fibrillation from last admission. Patient underwent Transesophageal Echocardiogram/cardioversion on 12/29/2020. Converted to sinus rhythm and started on Eliquis. Heparin IV started on admission. -Continue heparin IV and transition back to Eliquis pending management of above  Diabetes mellitus, type 2 Patient is on Lantus as an outpaitnet  Raynaud's On amlodipine as an outpatient which was held secondary to hypotension.  Possible OSA -Continue CPAP qhs  Hyponatremia Mild. Patient hypovolemic on admission which may be etiology. Given NS boluses. Improving with lasix. -BMP daily  History of renal transplant -Continue home mycophenolate and tacrolimus  Primary hypertension Patient is on amlodipine, metoprolol and furosemide as an outpatient which were held secondary to hypotension.  Depression Patient is on Prozac as an outpatient -Continue Prozac  Gout Patient is on colchicine and allopurinol as an outpatient -Continue  allopurinol   DVT prophylaxis: Heparin IV  Code Status:   Code Status: Full Code Family Communication: None at bedside. Daughter on telephone Disposition Plan: Discharge home vs SNF likely in several days pending general surgery/IR recommendations in addition to PT recommendations   Consultants:   General surgery  Interventional radiology  Procedures:   LLQ ABSCESS DRAIN (01/24/2021)  Antimicrobials:  Vancomycin IV  Vancomycin PO  Flagyl  Cefepime    Subjective: No concerns this morning. Abdominal pain is tolerable.  Objective: Vitals:   01/25/21 0900 01/25/21 1000 01/25/21 1100 01/25/21 1200  BP:  (!) 150/68  (!) 158/82  Pulse: 91 90 92 90  Resp: (!) 25 16 (!) 28 19  Temp:      TempSrc:      SpO2: 100% 100% 100% 100%  Weight:      Height:        Intake/Output Summary (Last 24 hours) at 01/25/2021 1230 Last data filed at 01/25/2021 1002 Gross per 24 hour  Intake 406.97 ml  Output 1305 ml  Net -898.03 ml   Filed Weights   12/25/2020 1855 01/21/21 0749 01/22/21 0500  Weight: 71.4 kg 74.2 kg 73.3 kg    Examination:  General exam: Appears calm and comfortable Respiratory system: Clear to auscultation. Respiratory effort normal. Cardiovascular system: S1 & S2 heard, RRR. No murmurs, rubs, gallops or clicks. Gastrointestinal system: Abdomen is nondistended, soft and mildly tender. No organomegaly or masses felt. Normal bowel sounds heard. Central nervous system: Alert and oriented. No focal neurological deficits. Musculoskeletal: BLE pitting edema. No calf tenderness Skin: No cyanosis. No rashes Psychiatry: Judgement and insight appear normal. Mood & affect appropriate.    Data Reviewed: I have personally reviewed following labs and imaging studies  CBC Lab Results  Component Value Date   WBC 4.5 01/25/2021   RBC 2.73 (L) 01/25/2021   HGB 8.5 (L) 01/25/2021   HCT 27.7 (L) 01/25/2021   MCV 101.5 (H) 01/25/2021   MCH 31.1 01/25/2021   PLT 158 01/25/2021    MCHC 30.7 01/25/2021   RDW 19.4 (H) 01/25/2021   LYMPHSABS 0.4 (L) 01/22/2021   MONOABS 0.1 01/22/2021   EOSABS 0.0 01/22/2021   BASOSABS 0.0 57/84/6962     Last metabolic panel Lab Results  Component Value Date   NA 133 (L) 01/25/2021   K 3.8 01/25/2021   CL 105 01/25/2021   CO2 17 (L) 01/25/2021   BUN 53 (H) 01/25/2021   CREATININE 2.00 (H) 01/25/2021   GLUCOSE 217 (H) 01/25/2021   GFRNONAA 28 (L) 01/25/2021   GFRAA 51 (L) 08/05/2020   CALCIUM 9.0 01/25/2021   PHOS 4.1 01/21/2021   PROT 5.7 (L) 01/22/2021   ALBUMIN 2.1 (L) 01/22/2021   LABGLOB 3.8 11/04/2020   AGRATIO 0.9 05/31/2020   BILITOT 1.1 01/22/2021   ALKPHOS 80 01/22/2021   AST 20 01/22/2021   ALT 9 01/22/2021   ANIONGAP 11 01/25/2021    CBG (last 3)  Recent Labs    01/24/21 2114 01/25/21 0818 01/25/21 1141  GLUCAP 164* 198* 206*     GFR: Estimated Creatinine Clearance: 26.4 mL/min (A) (by C-G formula based on SCr of 2 mg/dL (H)).  Coagulation Profile: Recent Labs  Lab 12/30/2020 1339 01/21/21 0239 01/22/21 0258 01/23/21 0248 01/24/21 0252  INR 2.8* 3.1* 2.2* 1.6* 1.9*    Recent Results (from the past 240 hour(s))  Resp Panel by RT-PCR (Flu A&B, Covid) Nasopharyngeal Swab     Status: None   Collection Time: 01/08/2021  2:05 PM   Specimen: Nasopharyngeal Swab;  Nasopharyngeal(NP) swabs in vial transport medium  Result Value Ref Range Status   SARS Coronavirus 2 by RT PCR NEGATIVE NEGATIVE Final    Comment: (NOTE) SARS-CoV-2 target nucleic acids are NOT DETECTED.  The SARS-CoV-2 RNA is generally detectable in upper respiratory specimens during the acute phase of infection. The lowest concentration of SARS-CoV-2 viral copies this assay can detect is 138 copies/mL. A negative result does not preclude SARS-Cov-2 infection and should not be used as the sole basis for treatment or other patient management decisions. A negative result may occur with  improper specimen collection/handling,  submission of specimen other than nasopharyngeal swab, presence of viral mutation(s) within the areas targeted by this assay, and inadequate number of viral copies(<138 copies/mL). A negative result must be combined with clinical observations, patient history, and epidemiological information. The expected result is Negative.  Fact Sheet for Patients:  EntrepreneurPulse.com.au  Fact Sheet for Healthcare Providers:  IncredibleEmployment.be  This test is no t yet approved or cleared by the [place] FDA and  has been authorized for detection and/or diagnosis of SARS-CoV-2 by FDA under an Emergency Use Authorization (EUA). This EUA will remain  in effect (meaning this test can be used) for the duration of the COVID-19 declaration under Section 564(b)(1) of the Act, 21 U.S.C.section 360bbb-3(b)(1), unless the authorization is terminated  or revoked sooner.       Influenza A by PCR NEGATIVE NEGATIVE Final   Influenza B by PCR NEGATIVE NEGATIVE Final    Comment: (NOTE) The Xpert Xpress SARS-CoV-2/FLU/RSV plus assay is intended as an aid in the diagnosis of influenza from Nasopharyngeal swab specimens and should not be used as a sole basis for treatment. Nasal washings and aspirates are unacceptable for Xpert Xpress SARS-CoV-2/FLU/RSV testing.  Fact Sheet for Patients: EntrepreneurPulse.com.au  Fact Sheet for Healthcare Providers: IncredibleEmployment.be  This test is not yet approved or cleared by the [place] FDA and has been authorized for detection and/or diagnosis of SARS-CoV-2 by FDA under an Emergency Use Authorization (EUA). This EUA will remain in effect (meaning this test can be used) for the duration of the COVID-19 declaration under Section 564(b)(1) of the Act, 21 U.S.C. section 360bbb-3(b)(1), unless the authorization is terminated or revoked.  Performed at [facility], [place] [address] [place], [place] [address]   Culture, blood (Routine x 2)     Status: None   Collection Time: 01/07/2021  3:28 PM   Specimen: BLOOD  Result Value Ref Range Status   Specimen Description   Final    BLOOD RIGHT ARM Performed at [facility] [address] [place], [place] [address]    Special Requests   Final    BOTTLES DRAWN AEROBIC AND ANAEROBIC Blood Culture adequate volume Performed at [facility] [address] [place], [place] [address]    Culture   Final    NO GROWTH 5 DAYS Performed at [facility], [place] [address], [place], [place] [address]    Report Status 01/25/2021 FINAL  Final  MRSA PCR Screening     Status: None   Collection Time: 12/25/2020  6:20 PM   Specimen: Nasopharyngeal  Result Value Ref Range Status   MRSA by PCR NEGATIVE NEGATIVE Final    Comment:        The GeneXpert MRSA Assay (FDA approved for NASAL specimens only), is one component of a comprehensive MRSA colonization surveillance program. It is not intended to diagnose MRSA infection nor to  guide or monitor treatment for MRSA infections. Performed at [facility], [place] [address], [place], [place] [address]   Urine culture     Status: None   Collection Time: 01/05/2021  6:21 PM   Specimen: Urine, Random  Result Value Ref Range Status   Specimen Description   Final    URINE, RANDOM Performed at [facility] [address], [place], [place] [address]    Special Requests   Final    NONE Performed at [facility], [place] [address] [place], [place] [address]    Culture   Final    NO GROWTH Performed at [facility], [place] [address], [place], [place] [address]    Report Status 01/22/2021 FINAL  Final  C Difficile Quick Screen w PCR reflex     Status: Abnormal   Collection Time: 01/21/21  3:43 PM   Specimen: STOOL  Result Value Ref Range Status   C Diff antigen  POSITIVE (A) NEGATIVE Final   C Diff toxin POSITIVE (A) NEGATIVE Final   C Diff interpretation Toxin producing C. difficile detected.  Final    Comment: CRITICAL RESULT CALLED TO, READ BACK BY AND VERIFIED WITH: P.MERCADO AT 2017 ON 03.01.22 BY N.THOMPSON Performed at [facility], [place] [address] [place], [place] [address]   Aerobic/Anaerobic Culture (surgical/deep wound)     Status: None (Preliminary result)   Collection Time: 01/24/21  1:08 PM   Specimen: Abscess  Result Value Ref Range Status   Specimen Description   Final    ABSCESS Performed at [facility] [address], [place], [place] [address]    Special Requests   Final    NONE Performed at [facility], [place] [address], [place], [place] [address]    Gram Stain   Final    MODERATE WBC PRESENT, PREDOMINANTLY PMN MODERATE GRAM POSITIVE COCCI IN PAIRS IN CLUSTERS FEW GRAM POSITIVE RODS RARE GRAM NEGATIVE RODS Performed at [facility], [place] [address], [place], [place] [address]    Culture PENDING  Incomplete   Report Status PENDING  Incomplete        Radiology Studies: CT IMAGE GUIDED DRAINAGE BY PERCUTANEOUS CATHETER  Result Date: 01/24/2021 INDICATION: 65 year old woman with left lower quadrant abscess presents to interventional radiology for CT-guided drain placement. EXAM: CT GUIDED DRAINAGE OF LEFT LOWER QUADRANT ABSCESS MEDICATIONS: The patient is currently admitted to the hospital and receiving intravenous antibiotics. The antibiotics were administered within an appropriate time frame prior to the initiation of the procedure. ANESTHESIA/SEDATION: 1 mg IV Versed 50 mcg IV Fentanyl Moderate Sedation Time:  11 minutes The patient was continuously monitored during the procedure by the interventional radiology nurse under my direct supervision. COMPLICATIONS: None immediate. TECHNIQUE: Informed written consent was obtained from the patient after a thorough  discussion of the procedural risks, benefits and alternatives. All questions were addressed. Maximal Sterile Barrier Technique was utilized including caps, mask, sterile gowns, sterile gloves, sterile drape, hand hygiene and skin antiseptic. A timeout was performed prior to the initiation of the procedure. PROCEDURE: Patient positioned supine on the procedure table. The left lower quadrant anterior abdominal wall skin was prepped with Chlorhexidine in a sterile fashion, and a sterile drape was applied covering the operative field. A sterile gown and sterile gloves were used for the procedure. Local anesthesia was provided with 1% Lidocaine. Following local lidocaine administration, 18 gauge needle was advanced into the left lower  quadrant collection utilizing CT guidance. Purulent material was noted to return from the needle. The needle was removed over 0.035 inch guidewire. 10.2 [place] multipurpose pigtail drain inserted over the guidewire. 30 mL of purulent material aspirated. Samples sent for Gram stain and culture. Drain secured to skin with suture and connected to bulb suction. FINDINGS: Left lower quadrant abscess IMPRESSION: 10.2 French multipurpose pigtail drain placed in left lower quadrant abscess utilizing CT guidance. Electronically Signed   By: Miachel Roux M.D.   On: 01/24/2021 14:31        Scheduled Meds: . (feeding supplement) PROSource Plus  30 mL Oral BID BM  . allopurinol  100 mg Oral Daily  . brimonidine  1 drop Both Eyes BID  . Chlorhexidine Gluconate Cloth  6 each Topical Daily  . cycloSPORINE  1 drop Both Eyes BID  . dorzolamide  1 drop Both Eyes Daily  . feeding supplement  237 mL Oral BID BM  . FLUoxetine  40 mg Oral Daily  . furosemide  80 mg Intravenous BID  . hydrocortisone sod succinate (SOLU-CORTEF) inj  50 mg Intravenous Q8H  . insulin aspart  0-5 Units Subcutaneous QHS  . insulin aspart  0-9 Units Subcutaneous TID WC  . insulin glargine  5 Units Subcutaneous BID   . latanoprost  1 drop Both Eyes QHS  . mouth rinse  15 mL Mouth Rinse BID  . multivitamin with minerals  1 tablet Oral Daily  . mycophenolate  180 mg Oral BID  . nutrition supplement (JUVEN)  1 packet Oral BID BM  . sodium chloride flush  3 mL Intravenous Q12H  . sodium chloride flush  5 mL Intracatheter Q8H  . tacrolimus  1 mg Oral BID  . vancomycin  125 mg Oral QID   Continuous Infusions: . ceFEPime (MAXIPIME) IV Stopped (01/24/21 1815)  . heparin 800 Units/hr (01/25/21 1000)  . metronidazole Stopped (01/25/21 2500)     LOS: 5 days     Cordelia Poche, MD [facility] 01/25/2021, 12:30 PM  If 7PM-7AM, please contact night-coverage [email or web address]

## 2021-01-25 NOTE — Evaluation (Signed)
Clinical/Bedside Swallow Evaluation Patient Details  Name: April [name] MRN: [id number] Date of Birth: 1956-09-23  Today's Date: 01/25/2021 Time: SLP Start Time (ACUTE ONLY): 1500 SLP Stop Time (ACUTE ONLY): 1515 SLP Time Calculation (min) (ACUTE ONLY): 15 min  Past Medical History:  Past Medical History:  Diagnosis Date  . Arthritis   . Asthma   . CHF (congestive heart failure) ([place])   . COPD (chronic obstructive pulmonary disease) ([place])   . Depression   . Essential hypertension 02/08/2020  . GERD (gastroesophageal reflux disease)   . Hyperlipidemia   . Hyperparathyroidism   . Polycystic kidney   . PONV (postoperative nausea and vomiting)    Past Surgical History:  Past Surgical History:  Procedure Laterality Date  . 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 BILATERAL OOPHORECTOMY    . BIOPSY  05/19/2020   Procedure: BIOPSY;  Surgeon: Carol Ada, MD;  Location: [facility];  Service: Endoscopy;;  . CARDIOVERSION N/A 12/25/2020   Procedure: CARDIOVERSION;  Surgeon: Geralynn Rile, MD;  Location: [facility];  Service: Cardiovascular;  Laterality: N/A;  . CARPAL TUNNEL RELEASE  2000  . CESAREAN SECTION    . COLONOSCOPY WITH PROPOFOL N/A 05/19/2020   Procedure: COLONOSCOPY WITH PROPOFOL;  Surgeon: Carol Ada, MD;  Location: [facility];  Service: Endoscopy;  Laterality: N/A;  . ENTEROSCOPY N/A 05/19/2020   Procedure: ENTEROSCOPY;  Surgeon: Carol Ada, MD;  Location: [facility];  Service: Endoscopy;  Laterality: N/A;  . INSERTION OF DIALYSIS CATHETER  03/31/2012   Procedure: INSERTION OF DIALYSIS CATHETER;  Surgeon: Angelia Mould, MD;  Location: [facility];  Service: Vascular;  Laterality: N/A;  insertion of dialysis catheter right internal jugular vein  . 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 LAPAROTOMY N/A 12/29/2020   Procedure: EXPLORATORY LAPAROTOMY, SIGMOID COLECTOMY AND COLOSTOMY;  Surgeon: Coralie Keens, MD;  Location: [facility];   Service: General;  Laterality: N/A;  Packed Dressing  . ORIF TIBIA FRACTURE Left 12/23/2018   Procedure: OPEN REDUCTION INTERNAL FIXATION (ORIF) TIBIA FRACTURE;  Surgeon: Shona Needles, MD;  Location: [place];  Service: Orthopedics;  Laterality: Left;  . ORIF TIBIA PLATEAU Right 12/23/2018   Procedure: OPEN REDUCTION INTERNAL FIXATION (ORIF) TIBIAL PLATEAU;  Surgeon: Shona Needles, MD;  Location: [facility];  Service: Orthopedics;  Laterality: Right;  . ORIF WRIST FRACTURE Left 12/08/2019   Procedure: OPEN REDUCTION INTERNAL FIXATION (ORIF) WRIST FRACTURE, REPAIR LACERATION LEFT FOREARM;  Surgeon: Dorna Leitz, MD;  Location: [facility];  Service: Orthopedics;  Laterality: Left;  . TEE WITHOUT CARDIOVERSION N/A 12/25/2020   Procedure: TRANSESOPHAGEAL ECHOCARDIOGRAM (TEE);  Surgeon: Geralynn Rile, MD;  Location: [facility];  Service: Cardiovascular;  Laterality: N/A;  . Arial  . TOTAL HIP ARTHROPLASTY Left 12/08/2019   Procedure: TOTAL HIP ARTHROPLASTY ANTERIOR APPROACH;  Surgeon: Dorna Leitz, MD;  Location: [facility];  Service: Orthopedics;  Laterality: Left;  . TUBAL LIGATION  2010   HPI:  April [name] is a 65 y.o. female with a history of atrial flutter s/p DCCV, chronic respiratory failure on 2 lpm of oxygen, asthma, possible OSA, CKD stage IIIa, ESRD s/p renal transplant, diastolic heart failure, diabetes, Raynaud's phenomenon. Patient presented secondary to acute encephalopathy with associated hypotension in setting of intraabdominal fluid collection and recent intraabdominal surgery.  SLP noted pt underwent MBS 01/2020 at [facility] when admitted with CHF - MBS was unremarkable and odynophagia deemed to be pt's issue.  Pt  does admit to "choking" sometimes on liquids - has h/o GERD.  Pt was most recently seen for a BSE on 01/01/21 with recommendation for Dysphagia 3 (soft) solids and thin liquids.   Assessment / Plan / Recommendation Clinical Impression  Pt was seen for a bedside  swallow evaluation and presents with suspected mild oral dysphagia.  Pt was encountered awake/alert with daughter present at bedside.  Daughter reported that the pt had been tolerating small sips of thin liquid and soft solids well, but that she had some difficulty with large sips of thin liquid and regular solids.  Oral mechanism examination was [facility].  Pt consumed trials of ice chips, thin liquid, puree, and regular solids.  She independently took small sips of thin liquid and small bites of solids.  Mastication of regular solids was mildly prolonged with minimal oral residue noted in the pt's oral cavity.  Pt was sensate to residue and she independently cleared it with a liquid wash.  No overt s/sx of aspiration were observed with any trials on this date.  Therefore, recommend diet change to Dysphagia 3 (soft) solids and thin liquids with medications administered one at a time in liquid or in puree per pt preference.  No additional skilled ST is warranted at this time targeting dysphagia.  Please re-consult if additional needs arise.   Of note, pt was observed to require increased processing time to answer questions during this evaluation, and pt's daughter reported that the pt has had more effortful communication since her extubation in early February (previous admission).  Pt may benefit from a comprehensive cognitive-linguistic evaluation to further assess.  Will speak with MD regarding daughter's request.  SLP Visit Diagnosis: Dysphagia, unspecified (R13.10)    Aspiration Risk  Mild aspiration risk    Diet Recommendation Dysphagia 3 (Mech soft);Thin liquid   Liquid Administration via: Cup;Straw Medication Administration: Whole meds with liquid Supervision: Staff to assist with self feeding Compensations: Slow rate;Small sips/bites Postural Changes: Remain upright for at least 30 minutes after po intake;Seated upright at 90 degrees    Other  Recommendations Oral Care Recommendations: Oral care  BID   Follow up Recommendations None      Frequency and Duration min 2x/week  1 week       Prognosis Prognosis for Safe Diet Advancement: Fair      Swallow Study   General HPI: [name] is a 65 y.o. female with a history of atrial flutter s/p DCCV, chronic respiratory failure on 2 lpm of oxygen, asthma, possible OSA, CKD stage IIIa, ESRD s/p renal transplant, diastolic heart failure, diabetes, Raynaud's phenomenon. Patient presented secondary to acute encephalopathy with associated hypotension in setting of intraabdominal fluid collection and recent intraabdominal surgery.  SLP noted pt underwent MBS 01/2020 at [facility] when admitted with CHF - MBS was unremarkable and odynophagia deemed to be pt's issue.  Pt does admit to "choking" sometimes on liquids - has h/o GERD.  Pt was most recently seen for a BSE on 01/01/21 with recommendation for Dysphagia 3 (soft) solids and thin liquids. Type of Study: Bedside Swallow Evaluation Previous Swallow Assessment: See HPI Diet Prior to this Study: Regular;Thin liquids Temperature Spikes Noted: No Respiratory Status: Nasal cannula History of Recent Intubation: No Behavior/Cognition: Alert;Cooperative;Pleasant mood Oral Cavity Assessment: Within Functional Limits Oral Care Completed by SLP: No Oral Cavity - Dentition: Adequate natural dentition Vision: Functional for self-feeding Self-Feeding Abilities: Needs assist (secondary to tremors) Patient Positioning: Upright in bed Baseline Vocal Quality: Normal;Low vocal intensity  Volitional Swallow: Able to elicit    Oral/Motor/Sensory Function Overall Oral Motor/Sensory Function: Within functional limits   Ice Chips Ice chips: Within functional limits Presentation: Spoon   Thin Liquid Thin Liquid: Within functional limits Presentation: Spoon;Straw    Nectar Thick Nectar Thick Liquid: Not tested   Honey Thick Honey Thick Liquid: Not tested   Puree Puree: Within functional  limits Presentation: Spoon   Solid     Solid: Impaired Presentation: Spoon Oral Phase Impairments: Impaired mastication Oral Phase Functional Implications: Impaired mastication;Prolonged oral transit     Colin Mulders M.S., CCC-SLP Acute Rehabilitation Services Office: [phone]  [place] 01/25/2021,3:27 PM

## 2021-01-25 NOTE — Progress Notes (Signed)
Subjective/Chief Complaint: PT doing well overnight    Objective: Vital signs in last 24 hours: Temp:  [97.4 F (36.3 C)-98.5 F (36.9 C)] 98.5 F (36.9 C) (03/05 0400) Pulse Rate:  [80-97] 86 (03/05 0600) Resp:  [11-24] 11 (03/05 0600) BP: (138-164)/(46-72) 156/65 (03/05 0600) SpO2:  [100 %] 100 % (03/05 0600) Last BM Date: 01/24/21  Intake/Output from previous day: 03/04 0701 - 03/05 0700 In: 200 [IV Piggyback:200] Out: 1330 [Urine:1000; Drains:30; Stool:300] Intake/Output this shift: Total I/O In: -  Out: 350 [Urine:350] PE:  Constitutional: No acute distress, conversant, appears states age. Eyes: Anicteric sclerae, moist conjunctiva, no lid lag Lungs: Clear to auscultation bilaterally, normal respiratory effort CV: regular rate and rhythm, no murmurs, no peripheral edema, pedal pulses 2+ GI: Soft, no masses or hepatosplenomegaly, non-tender to palpation, ostomy pink/patent, midline inc c/d/i Skin: No rashes, palpation reveals normal turgor Psychiatric: appropriate judgment and insight, oriented to person, place, and time   Lab Results:  Recent Labs    01/24/21 0252 01/25/21 0523  WBC 5.3 4.5  HGB 8.3* 8.5*  HCT 26.7* 27.7*  PLT 178 158   BMET Recent Labs    01/24/21 0252 01/25/21 0523  NA 131* 133*  K 3.9 3.8  CL 104 105  CO2 18* 17*  GLUCOSE 237* 217*  BUN 57* 53*  CREATININE 2.17* 2.00*  CALCIUM 8.5* 9.0   PT/INR Recent Labs    01/23/21 0248 01/24/21 0252  LABPROT 18.8* 20.8*  INR 1.6* 1.9*   ABG No results for input(s): PHART, HCO3 in the last 72 hours.  Invalid input(s): PCO2, PO2  Studies/Results: CT IMAGE GUIDED DRAINAGE BY PERCUTANEOUS CATHETER  Result Date: 01/24/2021 INDICATION: 65 year old woman with left lower quadrant abscess presents to interventional radiology for CT-guided drain placement. EXAM: CT GUIDED DRAINAGE OF LEFT LOWER QUADRANT ABSCESS MEDICATIONS: The patient is currently admitted to the hospital and  receiving intravenous antibiotics. The antibiotics were administered within an appropriate time frame prior to the initiation of the procedure. ANESTHESIA/SEDATION: 1 mg IV Versed 50 mcg IV Fentanyl Moderate Sedation Time:  11 minutes The patient was continuously monitored during the procedure by the interventional radiology nurse under my direct supervision. COMPLICATIONS: None immediate. TECHNIQUE: Informed written consent was obtained from the patient after a thorough discussion of the procedural risks, benefits and alternatives. All questions were addressed. Maximal Sterile Barrier Technique was utilized including caps, mask, sterile gowns, sterile gloves, sterile drape, hand hygiene and skin antiseptic. A timeout was performed prior to the initiation of the procedure. PROCEDURE: Patient positioned supine on the procedure table. The left lower quadrant anterior abdominal wall skin was prepped with Chlorhexidine in a sterile fashion, and a sterile drape was applied covering the operative field. A sterile gown and sterile gloves were used for the procedure. Local anesthesia was provided with 1% Lidocaine. Following local lidocaine administration, 18 gauge needle was advanced into the left lower quadrant collection utilizing CT guidance. Purulent material was noted to return from the needle. The needle was removed over 0.035 inch guidewire. 10.2 [place] multipurpose pigtail drain inserted over the guidewire. 30 mL of purulent material aspirated. Samples sent for Gram stain and culture. Drain secured to skin with suture and connected to bulb suction. FINDINGS: Left lower quadrant abscess IMPRESSION: 10.2 French multipurpose pigtail drain placed in left lower quadrant abscess utilizing CT guidance. Electronically Signed   By: Miachel Roux M.D.   On: 01/24/2021 14:31    Anti-infectives: Anti-infectives (From admission, onward)   Start  Dose/Rate Route Frequency Ordered Stop   01/21/21 2200  vancomycin  (VANCOCIN) 50 mg/mL oral solution 125 mg        125 mg Oral 4 times daily 01/21/21 2028 01/31/21 2159   01/21/21 1800  ceFEPIme (MAXIPIME) 2 g in sodium chloride 0.9 % 100 mL IVPB        2 g 200 mL/hr over 30 Minutes Intravenous Every 24 hours 12/31/2020 1742     01/21/21 0000  metroNIDAZOLE (FLAGYL) IVPB 500 mg        500 mg 100 mL/hr over 60 Minutes Intravenous Every 8 hours 01/09/2021 1821     12/26/2020 1800  vancomycin (VANCOCIN) IVPB 1000 mg/200 mL premix  Status:  Discontinued        1,000 mg 200 mL/hr over 60 Minutes Intravenous Every 48 hours 12/27/2020 1742 01/22/21 1053   01/02/2021 1545  ceFEPIme (MAXIPIME) 2 g in sodium chloride 0.9 % 100 mL IVPB        2 g 200 mL/hr over 30 Minutes Intravenous  Once 01/01/2021 1544 01/15/2021 1625   01/19/2021 1545  metroNIDAZOLE (FLAGYL) IVPB 500 mg        500 mg 100 mL/hr over 60 Minutes Intravenous  Once 12/31/2020 1544 01/04/2021 1730   12/31/2020 1545  vancomycin (VANCOCIN) IVPB 1000 mg/200 mL premix  Status:  Discontinued        1,000 mg 200 mL/hr over 60 Minutes Intravenous  Once 01/12/2021 1544 01/16/2021 1847      Assessment/Plan: Polycystic disease/ESRDwith renal transplant 2013-Prograf/prednisone. Elevated Cr- 2.19>>2.09 Hx CHF Chronic COPD/history of tobacco use- On 2L PRN at home  -Mostly off CPAP now. HxAge indeterminate, non occlusive deep vein thrombosis involving the right popliteal vein Hx SVT/Atrial flutter with RVR/TEE/DC cardioversion 12/25/20 Large hematoma left arm - drained and dressed- cleaning up hematoma still present Anemia- H/H 8.5/27.1>>7.3/24.1 Severe protein calorie malnutrition-Total protein 6.0/albumin 1.9 Hyponatremia Hypercoagulable - INR 2.8 (on Eliquis) Severe malnutrition-prealbumin<5 C. difficile colitis -antigen/toxin + 01/21/2021, tx per medicine  Sepsis and 3.2 x 2.8 x 5.3 cm abdominal fluid/abscess collectionadjacent to enterotomy staple line HxPerforated sigmoid diverticulitis -S/pExploratory  laparotomy with sigmoid colectomy/end colostomy, 12/29/2020 Dr. Coralie Keens -IR drain placed 3/4 -cont local wound care.  Will d/w MD.  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  However, her wound has fascial separation and don't really want to put too much abrasive contents on it to prevent development of EC fistulas etc.  May place mepitel over base of the wound and then put dressing over it. -ostomy stable and working well   FEN:N.p.o. for procedure OY:DXAJOINO/MVEHMC 2/28>> , Vancomycin IV 2/28 - Vancomycin PO 3/1 >>  NOB:[id number] gtt    Plan:  -IV abx -con't dressing changes   LOS: 5 days    Ralene Ok 01/25/2021

## 2021-01-25 NOTE — Evaluation (Addendum)
Physical Therapy Evaluation Patient Details Name: April [name] MRN: [id number] DOB: 1956-04-09 Today's Date: 01/25/2021   History of Present Illness  April [name] is a 65 y.o. female with a history of atrial flutter s/p DCCV, chronic respiratory failure on 2 lpm of oxygen, asthma, possible OSA, CKD stage IIIa, ESRD s/p renal transplant, diastolic heart failure, diabetes, Raynaud's phenomenon. Patient presented 12/29/2020 secondary to acute encephalopathy with associated hypotension in setting of intraabdominal fluid collection and recent intraabdominal surgery. General surgery and IR consulted for management.  Found to have C. Difficile .Recently in [facility] for new onset afib from 12/16/20-01/14/21 , DC to SNF. H/O Left hip and wrist fx 11/2019.  Clinical Impression  Patient resting in bed, Spo2 on 2 L .90%, HR 90's.  PT eval limited due to weakness. Patient participated in AAROM exercises to all extremities. UE's weeping. R LE tends to ER. RN ordering Prevalon boots for ankles. Patient known from last admission and appears much weaker. Patient requires a mechanical lift for transfers OOB at this( as well as she did last admission).  Patient comes from [facility] and plans to return.  Pt admitted with above diagnosis.  Pt currently with functional limitations due to the deficits listed below (see PT Problem List). Pt will benefit from skilled PT to increase their independence and safety with mobility to allow discharge to the venue listed below.       Follow Up Recommendations SNF    Equipment Recommendations  None recommended by PT    Recommendations for Other Services       Precautions / Restrictions Precautions Precautions: Fall Precaution Comments: Colostomy, weepy arms      Mobility  Bed Mobility               General bed mobility comments: patient seated upright in bed. DID not attempt any sitting at this time due to weakness.    Transfers                  General transfer comment: will require mechanical lift  Ambulation/Gait                Stairs            Wheelchair Mobility    Modified Rankin (Stroke Patients Only)       Balance   Sitting-balance support: Feet supported   Sitting balance - Comments: TBA                                     Pertinent Vitals/Pain Pain Assessment: Faces Faces Pain Scale: Hurts little more Pain Location: right arm with Rom Pain Descriptors / Indicators: Grimacing;Discomfort Pain Intervention(s): Monitored during session    Home Living Family/patient expects to be discharged to:: Skilled nursing facility                 Additional Comments: At home she lives with family, daughter is an OT and lives nearby, has lots of equipment    Prior Function     Gait / Transfers Assistance Needed: recently in SNF, Mechanical lift  for OOB last admission           Hand Dominance        Extremity/Trunk Assessment   Upper Extremity Assessment Upper Extremity Assessment: Generalized weakness (edema of both arms, weeping)    Lower Extremity Assessment Lower Extremity Assessment: RLE deficits/detail;LLE deficits/detail RLE  Deficits / Details: leg tends to ext. rotate, rotates to neutral only, strength grossly  3/5 hip and knee, DF intact but tend to position in equinovarus. RLE Sensation: history of peripheral neuropathy LLE Deficits / Details: hip rotation  has more range, grossly 3/5 strength, ankle similar to right. LLE Sensation: history of peripheral neuropathy       Communication      Cognition Arousal/Alertness: Awake/alert Behavior During Therapy: WFL for tasks assessed/performed;Flat affect Overall Cognitive Status: No family/caregiver present to determine baseline cognitive functioning                                 General Comments: AxO x 2 flat affect but following all commands      General Comments       Exercises General Exercises - Upper Extremity Shoulder Flexion: AROM;AAROM;Both;10 reps;Supine Shoulder ABduction: AROM;Both;10 reps;Seated Elbow Flexion: AROM;Both;Supine Elbow Extension: AROM;Both;Supine General Exercises - Lower Extremity Ankle Circles/Pumps: AROM;Both;10 reps Heel Slides: AAROM;Both;10 reps;Supine Hip ABduction/ADduction: AAROM;Both;10 reps;Supine   Assessment/Plan    PT Assessment Patient needs continued PT services  PT Problem List Decreased strength;Decreased mobility;Decreased safety awareness;Decreased knowledge of precautions;Decreased activity tolerance;Decreased balance       PT Treatment Interventions DME instruction;Therapeutic activities;Cognitive remediation;Therapeutic exercise;Patient/family education;Functional mobility training    PT Goals (Current goals can be found in the Care Plan section)  Acute Rehab PT Goals Patient Stated Goal: To go home PT Goal Formulation: With patient Time For Goal Achievement: 02-15-21 Potential to Achieve Goals: Fair    Frequency Min 2X/week   Barriers to discharge        Co-evaluation               AM-PAC PT "6 Clicks" Mobility  Outcome Measure Help needed turning from your back to your side while in a flat bed without using bedrails?: A Lot Help needed moving from lying on your back to sitting on the side of a flat bed without using bedrails?: A Lot Help needed moving to and from a bed to a chair (including a wheelchair)?: Total Help needed standing up from a chair using your arms (e.g., wheelchair or bedside chair)?: Total Help needed to walk in hospital room?: Total Help needed climbing 3-5 steps with a railing? : Total 6 Click Score: 8    End of Session Equipment Utilized During Treatment: Oxygen Activity Tolerance: Patient limited by fatigue Patient left: in bed;with call bell/phone within reach Nurse Communication: Mobility status;Need for lift equipment PT Visit Diagnosis: Muscle  weakness (generalized) (M62.81);Pain    Time: 1133-1200 PT Time Calculation (min) (ACUTE ONLY): 27 min   Charges:   PT Evaluation $PT Eval Low Complexity: 1 Low PT Treatments $Therapeutic Exercise: 8-22 mins        Tresa Endo PT Acute Rehabilitation Services Pager [phone] Office [phone]   Claretha Cooper 01/25/2021, 2:57 PM

## 2021-01-26 DIAGNOSIS — G9341 Metabolic encephalopathy: Secondary | ICD-10-CM | POA: Diagnosis not present

## 2021-01-26 DIAGNOSIS — A498 Other bacterial infections of unspecified site: Secondary | ICD-10-CM | POA: Diagnosis not present

## 2021-01-26 DIAGNOSIS — I5033 Acute on chronic diastolic (congestive) heart failure: Secondary | ICD-10-CM | POA: Diagnosis not present

## 2021-01-26 DIAGNOSIS — N179 Acute kidney failure, unspecified: Secondary | ICD-10-CM | POA: Diagnosis not present

## 2021-01-26 LAB — GLUCOSE, CAPILLARY
Glucose-Capillary: 190 mg/dL — ABNORMAL HIGH (ref 70–99)
Glucose-Capillary: 181 mg/dL — ABNORMAL HIGH (ref 70–99)
Glucose-Capillary: 201 mg/dL — ABNORMAL HIGH (ref 70–99)
Glucose-Capillary: 197 mg/dL — ABNORMAL HIGH (ref 70–99)

## 2021-01-26 LAB — CBC
HCT: 28 % — ABNORMAL LOW (ref 36.0–46.0)
Hemoglobin: 8.9 g/dL — ABNORMAL LOW (ref 12.0–15.0)
MCH: 31.8 pg (ref 26.0–34.0)
MCHC: 31.8 g/dL (ref 30.0–36.0)
MCV: 100 fL (ref 80.0–100.0)
Platelets: 223 10*3/uL (ref 150–400)
RBC: 2.8 MIL/uL — ABNORMAL LOW (ref 3.87–5.11)
RDW: 19.7 % — ABNORMAL HIGH (ref 11.5–15.5)
WBC: 8.5 10*3/uL (ref 4.0–10.5)
nRBC: 0.2 % (ref 0.0–0.2)

## 2021-01-26 LAB — BASIC METABOLIC PANEL
Anion gap: 9 (ref 5–15)
Anion gap: 11 (ref 5–15)
BUN: 65 mg/dL — ABNORMAL HIGH (ref 8–23)
BUN: 65 mg/dL — ABNORMAL HIGH (ref 8–23)
CO2: 19 mmol/L — ABNORMAL LOW (ref 22–32)
CO2: 20 mmol/L — ABNORMAL LOW (ref 22–32)
Calcium: 8.9 mg/dL (ref 8.9–10.3)
Calcium: 8.9 mg/dL (ref 8.9–10.3)
Chloride: 105 mmol/L (ref 98–111)
Chloride: 103 mmol/L (ref 98–111)
Creatinine, Ser: 2.01 mg/dL — ABNORMAL HIGH (ref 0.44–1.00)
Creatinine, Ser: 2.12 mg/dL — ABNORMAL HIGH (ref 0.44–1.00)
GFR, Estimated: 27 mL/min — ABNORMAL LOW (ref >60)
GFR, Estimated: 26 mL/min — ABNORMAL LOW (ref >60)
Glucose, Bld: 214 mg/dL — ABNORMAL HIGH (ref 70–99)
Glucose, Bld: 200 mg/dL — ABNORMAL HIGH (ref 70–99)
Potassium: 3.1 mmol/L — ABNORMAL LOW (ref 3.5–5.1)
Potassium: 3.3 mmol/L — ABNORMAL LOW (ref 3.5–5.1)
Sodium: 133 mmol/L — ABNORMAL LOW (ref 135–145)
Sodium: 134 mmol/L — ABNORMAL LOW (ref 135–145)

## 2021-01-26 LAB — HEPARIN LEVEL (UNFRACTIONATED)
Heparin Unfractionated: 0.52 IU/mL (ref 0.30–0.70)
Heparin Unfractionated: 0.59 IU/mL (ref 0.30–0.70)

## 2021-01-26 LAB — APTT: aPTT: 105 seconds — ABNORMAL HIGH (ref 24–36)

## 2021-01-26 MED ORDER — ALUM & MAG HYDROXIDE-SIMETH 200-200-20 MG/5ML PO SUSP
15.0000 mL | ORAL | Status: DC | PRN
  Administered 2021-01-26: 15 mL via ORAL
  Filled 2021-01-26: qty 30

## 2021-01-26 MED ORDER — POTASSIUM CHLORIDE CRYS ER 20 MEQ PO TBCR
40.0000 meq | EXTENDED_RELEASE_TABLET | ORAL | Status: AC
  Administered 2021-01-26 (×2): 40 meq via ORAL
  Filled 2021-01-26 (×2): qty 2

## 2021-01-26 MED ORDER — PREDNISONE 5 MG PO TABS
7.5000 mg | ORAL_TABLET | Freq: Every day | ORAL | Status: DC
  Administered 2021-01-26 – 2021-02-04 (×10): 7.5 mg via ORAL
  Filled 2021-01-26 (×10): qty 2

## 2021-01-26 MED ORDER — SODIUM CHLORIDE 0.9 % IV SOLN
INTRAVENOUS | Status: DC | PRN
  Administered 2021-01-26: 250 mL via INTRAVENOUS
  Administered 2021-01-28: 500 mL via INTRAVENOUS

## 2021-01-26 MED ORDER — FUROSEMIDE 10 MG/ML IJ SOLN
80.0000 mg | Freq: Two times a day (BID) | INTRAMUSCULAR | Status: DC
  Administered 2021-01-26 – 2021-01-30 (×10): 80 mg via INTRAVENOUS
  Filled 2021-01-26 (×10): qty 8

## 2021-01-26 MED ORDER — FUROSEMIDE 10 MG/ML IJ SOLN
40.0000 mg | Freq: Every day | INTRAMUSCULAR | Status: DC
  Filled 2021-01-26: qty 4

## 2021-01-26 MED ORDER — SODIUM CHLORIDE 0.9 % IV SOLN
INTRAVENOUS | Status: DC | PRN
  Administered 2021-01-26: 500 mL via INTRAVENOUS

## 2021-01-26 NOTE — Progress Notes (Signed)
Pharmacy Antibiotic Note  April [name] is a 65 y.o. female admitted on 12/25/2020 with abdominal wound infection, s/p IR drain on 3/4, cultures remain in progress.  Pharmacy has been consulted for cefepime dosing. Also on PO vancomycin for +C.diff  Plan:  Continue Cefepime 2g IV q24 per current renal function  Flagyl 500mg  IV q8h and Vanc PO per MD  Follow up renal function & cultures  Height: 5\' 1"  (154.9 cm) Weight: 73.3 kg (161 lb 9.6 oz) IBW/kg (Calculated) : 47.8  Temp (24hrs), Avg:98.4 F (36.9 C), Min:97.5 F (36.4 C), Max:99.1 F (37.3 C)  Recent Labs  Lab 12/24/2020 1339 01/04/2021 1537 01/21/21 0239 01/22/21 0258 01/23/21 0248 01/24/21 0252 01/25/21 0523 01/26/21 0145  WBC 6.8  --    < > 4.4 4.8 5.3 4.5 8.5  CREATININE 2.19*  --    < > 2.10* 2.21* 2.17* 2.00* 2.01*  LATICACIDVEN 0.8 0.9  --   --   --   --   --   --    < > = values in this interval not displayed.    Estimated Creatinine Clearance: 26.2 mL/min (A) (by C-G formula based on SCr of 2.01 mg/dL (H)).    Allergies  Allergen Reactions  . Infed [Iron Dextran] Other (See Comments)    Chest tightness  . Pentamidine Itching, Shortness Of Breath and Swelling  . Budesonide-Formoterol Fumarate     Other reaction(s): hoarseness  . Bupropion     Other reaction(s): exacerbated depression  . Crestor [Rosuvastatin]     Other reaction(s): body aches, swelling  . Duloxetine Hcl     Other reaction(s): GI SE, weepy, worsening fatigue, foggy  . Erythromycin [Erythromycin] Other (See Comments)    Mouth Ulcers  . Iohexol Other (See Comments)    Per patient "she has had a kidney transplant and should never have contrast"  . Oxycodone Nausea Only and Nausea And Vomiting  . Pravastatin Sodium     Other reaction(s): myalgias  . Erythromycin Rash    Causes breakout in mouth  . Ultram [Tramadol Hcl] Anxiety   Antimicrobials this admission: 2/28 vanc IV >>3/2 2/28 cefepime >> 2/28 flagyl >> 3/1 Vanc PO >>    Dose adjustments this admission:  Microbiology results: 2/28 Covid: negative 2/28 MRSA PCR: negative 2/28 BCx: NGF 2/28 UCx: NGF 3/1 CDiff: Ag +, toxin + 3/4 Abscess: moderate GPC, few GPR, rare GNR  Thank you for allowing pharmacy to be a part of this patient's care.  Gretta Arab PharmD, BCPS Clinical Pharmacist [facility] main pharmacy [phone] 01/26/2021 8:56 AM

## 2021-01-26 NOTE — Progress Notes (Signed)
Subjective/Chief Complaint: Pt doing well with no acute changes   Objective: Vital signs in last 24 hours: Temp:  [97.5 F (36.4 C)-99.6 F (37.6 C)] 98.3 F (36.8 C) (03/06 0400) Pulse Rate:  [87-123] 92 (03/06 0600) Resp:  [11-28] 13 (03/06 0600) BP: (150-176)/(64-117) 160/70 (03/06 0600) SpO2:  [100 %] 100 % (03/06 0600) Last BM Date: 01/25/21  Intake/Output from previous day: 03/05 0701 - 03/06 0700 In: 924.4 [P.O.:240; I.V.:272.5; IV Piggyback:406.8] Out: 1290 [Urine:1125; Drains:15; Stool:150] Intake/Output this shift: No intake/output data recorded.  General appearance: alert and cooperative GI: ostomy patent, midline wound granulating well  Lab Results:  Recent Labs    01/25/21 0523 01/26/21 0145  WBC 4.5 8.5  HGB 8.5* 8.9*  HCT 27.7* 28.0*  PLT 158 223   BMET Recent Labs    01/25/21 0523 01/26/21 0145  NA 133* 133*  K 3.8 3.1*  CL 105 105  CO2 17* 19*  GLUCOSE 217* 214*  BUN 53* 65*  CREATININE 2.00* 2.01*  CALCIUM 9.0 8.9   PT/INR Recent Labs    01/24/21 0252  LABPROT 20.8*  INR 1.9*   ABG No results for input(s): PHART, HCO3 in the last 72 hours.  Invalid input(s): PCO2, PO2  Studies/Results: CT IMAGE GUIDED DRAINAGE BY PERCUTANEOUS CATHETER  Result Date: 01/24/2021 INDICATION: 65 year old woman with left lower quadrant abscess presents to interventional radiology for CT-guided drain placement. EXAM: CT GUIDED DRAINAGE OF LEFT LOWER QUADRANT ABSCESS MEDICATIONS: The patient is currently admitted to the hospital and receiving intravenous antibiotics. The antibiotics were administered within an appropriate time frame prior to the initiation of the procedure. ANESTHESIA/SEDATION: 1 mg IV Versed 50 mcg IV Fentanyl Moderate Sedation Time:  11 minutes The patient was continuously monitored during the procedure by the interventional radiology nurse under my direct supervision. COMPLICATIONS: None immediate. TECHNIQUE: Informed written consent  was obtained from the patient after a thorough discussion of the procedural risks, benefits and alternatives. All questions were addressed. Maximal Sterile Barrier Technique was utilized including caps, mask, sterile gowns, sterile gloves, sterile drape, hand hygiene and skin antiseptic. A timeout was performed prior to the initiation of the procedure. PROCEDURE: Patient positioned supine on the procedure table. The left lower quadrant anterior abdominal wall skin was prepped with Chlorhexidine in a sterile fashion, and a sterile drape was applied covering the operative field. A sterile gown and sterile gloves were used for the procedure. Local anesthesia was provided with 1% Lidocaine. Following local lidocaine administration, 18 gauge needle was advanced into the left lower quadrant collection utilizing CT guidance. Purulent material was noted to return from the needle. The needle was removed over 0.035 inch guidewire. 10.2 [place] multipurpose pigtail drain inserted over the guidewire. 30 mL of purulent material aspirated. Samples sent for Gram stain and culture. Drain secured to skin with suture and connected to bulb suction. FINDINGS: Left lower quadrant abscess IMPRESSION: 10.2 French multipurpose pigtail drain placed in left lower quadrant abscess utilizing CT guidance. Electronically Signed   By: Miachel Roux M.D.   On: 01/24/2021 14:31    Anti-infectives: Anti-infectives (From admission, onward)   Start     Dose/Rate Route Frequency Ordered Stop   01/21/21 2200  vancomycin (VANCOCIN) 50 mg/mL oral solution 125 mg        125 mg Oral 4 times daily 01/21/21 2028 01/31/21 2159   01/21/21 1800  ceFEPIme (MAXIPIME) 2 g in sodium chloride 0.9 % 100 mL IVPB        2  g 200 mL/hr over 30 Minutes Intravenous Every 24 hours 01/17/2021 1742     01/21/21 0000  metroNIDAZOLE (FLAGYL) IVPB 500 mg        500 mg 100 mL/hr over 60 Minutes Intravenous Every 8 hours 01/03/2021 1821     01/04/2021 1800  vancomycin  (VANCOCIN) IVPB 1000 mg/200 mL premix  Status:  Discontinued        1,000 mg 200 mL/hr over 60 Minutes Intravenous Every 48 hours 01/05/2021 1742 01/22/21 1053   12/27/2020 1545  ceFEPIme (MAXIPIME) 2 g in sodium chloride 0.9 % 100 mL IVPB        2 g 200 mL/hr over 30 Minutes Intravenous  Once 12/29/2020 1544 01/16/2021 1625   12/26/2020 1545  metroNIDAZOLE (FLAGYL) IVPB 500 mg        500 mg 100 mL/hr over 60 Minutes Intravenous  Once 01/16/2021 1544 01/15/2021 1730   01/07/2021 1545  vancomycin (VANCOCIN) IVPB 1000 mg/200 mL premix  Status:  Discontinued        1,000 mg 200 mL/hr over 60 Minutes Intravenous  Once 01/18/2021 1544 12/29/2020 1847      Assessment/Plan: Polycystic disease/ESRDwith renal transplant 2013-Prograf/prednisone. Elevated Cr- 2.19>>2.09 Hx CHF Chronic COPD/history of tobacco use- On 2L PRN at home -Mostly off CPAP now. HxAge indeterminate, non occlusive deep vein thrombosis involving the right popliteal vein Hx SVT/Atrial flutter with RVR/TEE/DC cardioversion 12/25/20 Large hematoma left arm - drained and dressed- cleaning up hematoma still present Anemia- H/H 8.5/27.1>>7.3/24.1 Severe protein calorie malnutrition-Total protein 6.0/albumin 1.9 Hyponatremia Hypercoagulable - INR 2.8 (on Eliquis) Severe malnutrition-prealbumin<5 C. difficile colitis -antigen/toxin + 01/21/2021, tx per medicine  Sepsis and 3.2 x 2.8 x 5.3 cm abdominal fluid/abscess collectionadjacent to enterotomy staple line HxPerforated sigmoid diverticulitis -S/pExploratory laparotomy with sigmoid colectomy/end colostomy, 12/29/2020 Dr. Coralie Keens -IR drain placed 3/4 -cont local wound care. Will d/w MD. 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 However, her wound has fascial separation and don't really want to put too much abrasive contents on it to prevent development of EC fistulas etc. May place mepitel  over base of the wound and then put dressing over it. -ostomy stable and working well   FEN:N.p.o.for procedure CV:ELFYBOFB/PZWCHE 2/28>>, Vancomycin IV 2/28 - Vancomycin PO 3/1 >> NID:[id number] gtt   Plan:  -IV abx -con't dressing changes   LOS: 6 days    Ralene Ok 01/26/2021

## 2021-01-26 NOTE — Progress Notes (Signed)
CPAP on SB at this time. Pt to receive bath before bed. RN told to call RT when patient was ready for CPAP.

## 2021-01-26 NOTE — Progress Notes (Signed)
ANTICOAGULATION CONSULT NOTE - Follow Up Consult  Pharmacy Consult for Heparin Indication: atrial fibrillation  Allergies  Allergen Reactions  . Infed [Iron Dextran] Other (See Comments)    Chest tightness  . Pentamidine Itching, Shortness Of Breath and Swelling  . Budesonide-Formoterol Fumarate     Other reaction(s): hoarseness  . Bupropion     Other reaction(s): exacerbated depression  . Crestor [Rosuvastatin]     Other reaction(s): body aches, swelling  . Duloxetine Hcl     Other reaction(s): GI SE, weepy, worsening fatigue, foggy  . Erythromycin [Erythromycin] Other (See Comments)    Mouth Ulcers  . Iohexol Other (See Comments)    Per patient "she has had a kidney transplant and should never have contrast"  . Oxycodone Nausea Only and Nausea And Vomiting  . Pravastatin Sodium     Other reaction(s): myalgias  . Erythromycin Rash    Causes breakout in mouth  . Ultram [Tramadol Hcl] Anxiety    Patient Measurements: Height: 5\' 1"  (154.9 cm) Weight: 73.3 kg (161 lb 9.6 oz) IBW/kg (Calculated) : 47.8 Heparin Dosing Weight: 63kg  Vital Signs: Temp: 98.1 F (36.7 C) (03/06 1200) Temp Source: Axillary (03/06 1200) BP: 160/61 (03/06 1000) Pulse Rate: 94 (03/06 1300)  Labs: Recent Labs    01/24/21 0252 01/25/21 0523 01/25/21 1458 01/26/21 0145 01/26/21 1208  HGB 8.3* 8.5*  --  8.9*  --   HCT 26.7* 27.7*  --  28.0*  --   PLT 178 158  --  223  --   APTT 61* 56* 56* 105*  --   LABPROT 20.8*  --   --   --   --   INR 1.9*  --   --   --   --   HEPARINUNFRC 0.91* 0.61 0.53 0.52 0.59  CREATININE 2.17* 2.00*  --  2.01* 2.12*    Estimated Creatinine Clearance: 24.9 mL/min (A) (by C-G formula based on SCr of 2.12 mg/dL (H)).   Assessment: 57 yoF with PMH PAF s/p DCCV (12/25/20) on Eliquis, chronic resp fail (2L O2 baseline), CKD3 s/p renal txplant, dCHF, DM2, admitted after being found unresponsive at [facility]. Concern for infection after recent abdominal surgery; Pharmacy  consulted to convert Eliquis to IV heparin while possible procedures pending. During last admission, heparin was especially difficult to control, with rates ranging from 1650 units/hr down to 250 units/hr. Patient also noted to have significant bruising and hematomas on several extremities last admission. However, Cardiology has recommended at least 30 days of anticoagulation after DCCV (through 3/4).  PTA Eliquis 5mg  BID, last dose 2/28 09:00.  Baseline INR elevated; aPTT not done  3/4: s/p LLQ abscess drain.  Heparin off 0900-2130.  Resumed without bolus.   Today, 01/26/2021:   Heparin level 0.59; therapeutic, slightly increased despite rate decrease to heparin 900 units/hr.      CBC: Hgb 8.9 (remains low but improving), Plt 223  Known bruising.  No bleeding reported.  No infusion problems.  Goal of Therapy:  Heparin level 0.3-0.7 units/ml aPTT 66-102 seconds Monitor platelets by anticoagulation protocol: Yes   Plan:   Continue heparin 900 units/hr   Daily CBC and heparin level  Monitor for s/sx of bleeding  Follow up plans for long-term oral anticoagulation.   Gretta Arab PharmD, BCPS Clinical Pharmacist [facility] main pharmacy [phone] 01/26/2021 1:32 PM

## 2021-01-26 NOTE — Progress Notes (Signed)
ANTICOAGULATION CONSULT NOTE - Follow Up Consult  Pharmacy Consult for Heparin Indication: atrial fibrillation  Allergies  Allergen Reactions  . Infed [Iron Dextran] Other (See Comments)    Chest tightness  . Pentamidine Itching, Shortness Of Breath and Swelling  . Budesonide-Formoterol Fumarate     Other reaction(s): hoarseness  . Bupropion     Other reaction(s): exacerbated depression  . Crestor [Rosuvastatin]     Other reaction(s): body aches, swelling  . Duloxetine Hcl     Other reaction(s): GI SE, weepy, worsening fatigue, foggy  . Erythromycin [Erythromycin] Other (See Comments)    Mouth Ulcers  . Iohexol Other (See Comments)    Per patient "she has had a kidney transplant and should never have contrast"  . Oxycodone Nausea Only and Nausea And Vomiting  . Pravastatin Sodium     Other reaction(s): myalgias  . Erythromycin Rash    Causes breakout in mouth  . Ultram [Tramadol Hcl] Anxiety    Patient Measurements: Height: 5\' 1"  (154.9 cm) Weight: 73.3 kg (161 lb 9.6 oz) IBW/kg (Calculated) : 47.8 Heparin Dosing Weight: 63kg  Vital Signs: Temp: 97.5 F (36.4 C) (03/06 0000) Temp Source: Oral (03/06 0000) BP: 176/64 (03/06 0000) Pulse Rate: 101 (03/06 0000)  Labs: Recent Labs    01/24/21 0252 01/25/21 0523 01/25/21 1458 01/26/21 0145  HGB 8.3* 8.5*  --  8.9*  HCT 26.7* 27.7*  --  28.0*  PLT 178 158  --  223  APTT 61* 56* 56* 105*  LABPROT 20.8*  --   --   --   INR 1.9*  --   --   --   HEPARINUNFRC 0.91* 0.61 0.53 0.52  CREATININE 2.17* 2.00*  --  2.01*    Estimated Creatinine Clearance: 26.2 mL/min (A) (by C-G formula based on SCr of 2.01 mg/dL (H)).   Assessment: 33 yoF with PMH PAF s/p DCCV (12/25/20) on Eliquis, chronic resp fail (2L O2 baseline), CKD3 s/p renal txplant, dCHF, DM2, admitted after being found unresponsive at [facility]. Concern for infection after recent abdominal surgery; Pharmacy consulted to convert Eliquis to IV heparin while possible  procedures pending. During last admission, heparin was especially difficult to control, with rates ranging from 1650 units/hr down to 250 units/hr. Patient also noted to have significant bruising and hematomas on several extremities last admission. However, Cardiology has recommended at least 30 days of anticoagulation after DCCV (through 3/4).  PTA Eliquis 5mg  BID, last dose 2/28 09:00.  Baseline INR elevated; aPTT not done  3/4: s/p LLQ abscess drain.  Heparin off 0900-2130.  Resumed without bolus.   Today, 01/26/2021:   Heparin level = 0.52; therapeutic, APTT 105 sec (slightly above goal range) on heparin 950 units/hr.    RN reports heparin infusing without interruptions   CBC: Hgb 8.9 (remains low but improving), Plt 223  INR 1.9 (3/4)  Known bruising.  No bleeding reported.  Goal of Therapy:  Heparin level 0.3-0.7 units/ml aPTT 66-102 seconds Monitor platelets by anticoagulation protocol: Yes   Plan:   Decrease heparin slightly to 900 units/hr   Recheck 8h heparin level to confirm level (Will d/c aptt monitoring at this point)  Daily CBC and heparin level  Monitor for s/sx of bleeding  Netta Cedars, PharmD, BCPS [phone] 01/26/2021, 2:59 AM

## 2021-01-26 NOTE — Progress Notes (Signed)
Pt. Is currently nauseated as informed by RN, RT to monitor.

## 2021-01-26 NOTE — Progress Notes (Signed)
PROGRESS NOTE    April [name]  BTD:[id number] DOB: 11-25-1955 DOA: 01/16/2021 PCP: [name], MD   Brief Narrative: April [name] is a 65 y.o. female with a history of atrial flutter s/p DCCV, chronic respiratory failure on 2 lpm of oxygen, asthma, possible OSA, CKD stage IIIa, ESRD s/p renal transplant, diastolic heart failure, diabetes, Raynaud's phenomenon. Patient presented secondary to acute encephalopathy with associated hypotension in setting of intraabdominal fluid collection and recent intraabdominal surgery. General surgery and IR consulted for management. Patient managed empirically on antibiotics. Found to have C. Difficile and started on Vancomycin PO.   Assessment & Plan:   Principal Problem:   Acute metabolic encephalopathy Active Problems:   Renal transplant recipient   Depression   Acute respiratory failure with hypoxia (HCC)   Acute on chronic diastolic CHF (congestive heart failure) (HCC)   Acute renal failure superimposed on stage 3b chronic kidney disease (HCC)   Hypotension   Acute metabolic encephalopathy Unsure of etiology but resolved.  Abdominal fluid collection 3.2 x 2.8 x 5.3 cm abscess noted on CT scan. In setting of recent surgery. General surgery consulted who recommends IR drainage. Aspiration and drain placement performed by IR on 3/4. Preliminary culture data is significant for multiple organisms on gram stain -Continue Cefepime/Flagyl -Follow-up abscess culture data  Acute on chronic respiratory failure with hypoxia Unknown etiology but back to baseline 2 Lpm of oxygen. -Continue oxygen @ 2 lpm  AKI on CKD stage IIIa History of ESRD s/p renal transplant. Currently on mycophenolate, racrolimus. Left arm fistula. Baseline creatinine of 1.1-1.5 per chart review. Creatinine of 2.19 on admission in setting of hypotension in addition to heart failure. Improving with diuresis. -Lasix as mentioned below -Daily BMP  Acute on chronic  diastolic heart failure BNP significantly lower from baseline. Restarted lasix secondary to worsening AKI with significant edema -Daily BMP -Continue Lasix 80 mg IV BID -Daily weights and strict in/out  C. Difficile infection Mild symptoms. Positive antigen and toxin. Started on Vancomycin PO -Continue Vancomycin PO x 10 days  Acute on chronic anemia Normocytic anemia Iron deficiency anemia Acute anemia in setting of recent abdominal surgery. Currently stable. No current evidence of bleed -Daily CBC  Hypotension Patient received two 500 mL NS boluses. Does not appear to have had shock. Did not require vasopressor support. Resolved.  Acute urinary retention Unsure of etiology. Foley placed on admission -Voiding trial prior to discharge; will await until IV diuresis complete  Adrenal insufficiency Patient is on prednisone 7.5 mg daily as an outpatient. Started on stress dose steroids secondary   Typical atrial flutter Do not see mention of atrial fibrillation from last admission. Patient underwent Transesophageal Echocardiogram/cardioversion on 12/29/2020. Converted to sinus rhythm and started on Eliquis. Heparin IV started on admission. -Continue heparin IV and transition back to Eliquis pending management of above  Diabetes mellitus, type 2 Patient is on Lantus as an outpaitnet  Raynaud's On amlodipine as an outpatient which was held secondary to hypotension.  Possible OSA -Continue CPAP qhs  Hyponatremia Mild. Patient hypovolemic on admission which may be etiology. Given NS boluses. Improving with lasix. -BMP daily  History of renal transplant -Continue home mycophenolate and tacrolimus  Primary hypertension Patient is on amlodipine, metoprolol and furosemide as an outpatient which were held secondary to hypotension.  Depression Patient is on Prozac as an outpatient -Continue Prozac  Hypokalemia Secondary to diuresis -Potassium supplementation as  needed  Gout Patient is on colchicine and allopurinol as an outpatient -  Continue allopurinol   DVT prophylaxis: Heparin IV Code Status:   Code Status: Full Code Family Communication: None at bedside. Daughter on telephone Disposition Plan: Discharge home vs SNF likely in several days pending general surgery/IR recommendations in addition to PT recommendations   Consultants:   General surgery  Interventional radiology  Procedures:   LLQ ABSCESS DRAIN (01/24/2021)  Antimicrobials:  Vancomycin IV  Vancomycin PO  Flagyl  Cefepime    Subjective: No concerns this morning. Abdominal pain is tolerable.  Objective: Vitals:   01/26/21 0300 01/26/21 0400 01/26/21 0500 01/26/21 0600  BP:  (!) 164/64  (!) 160/70  Pulse: 94 (!) 101 93 92  Resp: 15 18 14 13   Temp:  98.3 F (36.8 C)    TempSrc:  Oral    SpO2: 100% 100% 100% 100%  Weight:      Height:        Intake/Output Summary (Last 24 hours) at 01/26/2021 0835 Last data filed at 01/26/2021 0600 Gross per 24 hour  Intake 842.19 ml  Output 865 ml  Net -22.81 ml   Filed Weights   01/15/2021 1855 01/21/21 0749 01/22/21 0500  Weight: 71.4 kg 74.2 kg 73.3 kg    Examination:  General exam: Appears calm and comfortable Respiratory system: Clear to auscultation. Respiratory effort normal. Cardiovascular system: S1 & S2 heard, RRR. 3/6 systolic murmur Gastrointestinal system: Abdomen is nondistended, soft and tender. No organomegaly or masses felt. Normal bowel sounds heard. Central nervous system: Alert and oriented. No focal neurological deficits. Musculoskeletal: Upper/lower extremity edema. No calf tenderness Skin: No cyanosis. Ecchymosis present Psychiatry: Judgement and insight appear normal. Mood & affect appropriate.    Data Reviewed: I have personally reviewed following labs and imaging studies  CBC Lab Results  Component Value Date   WBC 8.5 01/26/2021   RBC 2.80 (L) 01/26/2021   HGB 8.9 (L) 01/26/2021    HCT 28.0 (L) 01/26/2021   MCV 100.0 01/26/2021   MCH 31.8 01/26/2021   PLT 223 01/26/2021   MCHC 31.8 01/26/2021   RDW 19.7 (H) 01/26/2021   LYMPHSABS 0.4 (L) 01/22/2021   MONOABS 0.1 01/22/2021   EOSABS 0.0 01/22/2021   BASOSABS 0.0 85/88/5027     Last metabolic panel Lab Results  Component Value Date   NA 133 (L) 01/26/2021   K 3.1 (L) 01/26/2021   CL 105 01/26/2021   CO2 19 (L) 01/26/2021   BUN 65 (H) 01/26/2021   CREATININE 2.01 (H) 01/26/2021   GLUCOSE 214 (H) 01/26/2021   GFRNONAA 27 (L) 01/26/2021   GFRAA 51 (L) 08/05/2020   CALCIUM 8.9 01/26/2021   PHOS 4.1 01/21/2021   PROT 5.7 (L) 01/22/2021   ALBUMIN 2.1 (L) 01/22/2021   LABGLOB 3.8 11/04/2020   AGRATIO 0.9 05/31/2020   BILITOT 1.1 01/22/2021   ALKPHOS 80 01/22/2021   AST 20 01/22/2021   ALT 9 01/22/2021   ANIONGAP 9 01/26/2021    CBG (last 3)  Recent Labs    01/25/21 1634 01/25/21 2140 01/26/21 0748  GLUCAP 210* 268* 190*     GFR: Estimated Creatinine Clearance: 26.2 mL/min (A) (by C-G formula based on SCr of 2.01 mg/dL (H)).  Coagulation Profile: Recent Labs  Lab 01/04/2021 1339 01/21/21 0239 01/22/21 0258 01/23/21 0248 01/24/21 0252  INR 2.8* 3.1* 2.2* 1.6* 1.9*    Recent Results (from the past 240 hour(s))  Resp Panel by RT-PCR (Flu A&B, Covid) Nasopharyngeal Swab     Status: None   Collection Time: 01/06/2021  2:05  PM   Specimen: Nasopharyngeal Swab; Nasopharyngeal(NP) swabs in vial transport medium  Result Value Ref Range Status   SARS Coronavirus 2 by RT PCR NEGATIVE NEGATIVE Final    Comment: (NOTE) SARS-CoV-2 target nucleic acids are NOT DETECTED.  The SARS-CoV-2 RNA is generally detectable in upper respiratory specimens during the acute phase of infection. The lowest concentration of SARS-CoV-2 viral copies this assay can detect is 138 copies/mL. A negative result does not preclude SARS-Cov-2 infection and should not be used as the sole basis for treatment or other patient  management decisions. A negative result may occur with  improper specimen collection/handling, submission of specimen other than nasopharyngeal swab, presence of viral mutation(s) within the areas targeted by this assay, and inadequate number of viral copies(<138 copies/mL). A negative result must be combined with clinical observations, patient history, and epidemiological information. The expected result is Negative.  Fact Sheet for Patients:  EntrepreneurPulse.com.au  Fact Sheet for Healthcare Providers:  IncredibleEmployment.be  This test is no t yet approved or cleared by the [place] FDA and  has been authorized for detection and/or diagnosis of SARS-CoV-2 by FDA under an Emergency Use Authorization (EUA). This EUA will remain  in effect (meaning this test can be used) for the duration of the COVID-19 declaration under Section 564(b)(1) of the Act, 21 U.S.C.section 360bbb-3(b)(1), unless the authorization is terminated  or revoked sooner.       Influenza A by PCR NEGATIVE NEGATIVE Final   Influenza B by PCR NEGATIVE NEGATIVE Final    Comment: (NOTE) The Xpert Xpress SARS-CoV-2/FLU/RSV plus assay is intended as an aid in the diagnosis of influenza from Nasopharyngeal swab specimens and should not be used as a sole basis for treatment. Nasal washings and aspirates are unacceptable for Xpert Xpress SARS-CoV-2/FLU/RSV testing.  Fact Sheet for Patients: EntrepreneurPulse.com.au  Fact Sheet for Healthcare Providers: IncredibleEmployment.be  This test is not yet approved or cleared by the [place] FDA and has been authorized for detection and/or diagnosis of SARS-CoV-2 by FDA under an Emergency Use Authorization (EUA). This EUA will remain in effect (meaning this test can be used) for the duration of the COVID-19 declaration under Section 564(b)(1) of the Act, 21 U.S.C. section 360bbb-3(b)(1),  unless the authorization is terminated or revoked.  Performed at [facility], [place] [address] [place], [place] [address]   Culture, blood (Routine x 2)     Status: None   Collection Time: 01/08/2021  3:28 PM   Specimen: BLOOD  Result Value Ref Range Status   Specimen Description   Final    BLOOD RIGHT ARM Performed at [place] [address], [place], [place] [address]    Special Requests   Final    BOTTLES DRAWN AEROBIC AND ANAEROBIC Blood Culture adequate volume Performed at [place] [address] [place], [place] [address]    Culture   Final    NO GROWTH 5 DAYS Performed at [facility], [place] [address] [place], [place] [address]    Report Status 01/25/2021 FINAL  Final  MRSA PCR Screening     Status: None   Collection Time: 01/07/2021  6:20 PM   Specimen: Nasopharyngeal  Result Value Ref Range Status   MRSA by PCR NEGATIVE NEGATIVE Final    Comment:        The GeneXpert MRSA Assay (FDA approved for NASAL specimens only), is one component of a comprehensive MRSA colonization surveillance program. It is not intended  to diagnose MRSA infection nor to guide or monitor treatment for MRSA infections. Performed at [facility], [place] [address] [place], [place] [address]   Urine culture     Status: None   Collection Time: 12/25/2020  6:21 PM   Specimen: Urine, Random  Result Value Ref Range Status   Specimen Description   Final    URINE, RANDOM Performed at [place] [address] [place] [address], [place], [place] [address]    Special Requests   Final    NONE Performed at [facility], [place] [address], [place], [place] [address]    Culture   Final    NO GROWTH Performed at [facility], [place] [address], [place], [place] [address]    Report Status 01/22/2021 FINAL  Final  C Difficile Quick Screen w PCR reflex     Status: Abnormal   Collection  Time: 01/21/21  3:43 PM   Specimen: STOOL  Result Value Ref Range Status   C Diff antigen POSITIVE (A) NEGATIVE Final   C Diff toxin POSITIVE (A) NEGATIVE Final   C Diff interpretation Toxin producing C. difficile detected.  Final    Comment: CRITICAL RESULT CALLED TO, READ BACK BY AND VERIFIED WITH: P.MERCADO AT 2017 ON 03.01.22 BY N.THOMPSON Performed at [facility], [place] [address], [place], [place] [address]   Aerobic/Anaerobic Culture (surgical/deep wound)     Status: None (Preliminary result)   Collection Time: 01/24/21  1:08 PM   Specimen: Abscess  Result Value Ref Range Status   Specimen Description   Final    ABSCESS Performed at [place] [address], [place], [place] [address]    Special Requests   Final    NONE Performed at [facility], [place] [address], [place], [place] [address]    Gram Stain   Final    MODERATE WBC PRESENT, PREDOMINANTLY PMN MODERATE GRAM POSITIVE COCCI IN PAIRS IN CLUSTERS FEW GRAM POSITIVE RODS RARE GRAM NEGATIVE RODS Performed at [facility], [place] [address], [place], [place] [address]    Culture PENDING  Incomplete   Report Status PENDING  Incomplete        Radiology Studies: CT IMAGE GUIDED DRAINAGE BY PERCUTANEOUS CATHETER  Result Date: 01/24/2021 INDICATION: 65 year old woman with left lower quadrant abscess presents to interventional radiology for CT-guided drain placement. EXAM: CT GUIDED DRAINAGE OF LEFT LOWER QUADRANT ABSCESS MEDICATIONS: The patient is currently admitted to the hospital and receiving intravenous antibiotics. The antibiotics were administered within an appropriate time frame prior to the initiation of the procedure. ANESTHESIA/SEDATION: 1 mg IV Versed 50 mcg IV Fentanyl Moderate Sedation Time:  11 minutes The patient was continuously monitored during the procedure by the interventional radiology nurse under my direct supervision. COMPLICATIONS: None  immediate. TECHNIQUE: Informed written consent was obtained from the patient after a thorough discussion of the procedural risks, benefits and alternatives. All questions were addressed. Maximal Sterile Barrier Technique was utilized including caps, mask, sterile gowns, sterile gloves, sterile drape, hand hygiene and skin antiseptic. A timeout was performed prior to the initiation of the procedure. PROCEDURE: Patient positioned supine on the procedure table. The left lower quadrant anterior abdominal wall skin was prepped with Chlorhexidine in a sterile fashion, and a sterile drape was applied covering the operative field. A sterile gown and sterile gloves were used for the procedure. Local anesthesia was provided with 1% Lidocaine. Following local lidocaine administration, 18 gauge needle  was advanced into the left lower quadrant collection utilizing CT guidance. Purulent material was noted to return from the needle. The needle was removed over 0.035 inch guidewire. 10.2 [place] multipurpose pigtail drain inserted over the guidewire. 30 mL of purulent material aspirated. Samples sent for Gram stain and culture. Drain secured to skin with suture and connected to bulb suction. FINDINGS: Left lower quadrant abscess IMPRESSION: 10.2 [place] multipurpose pigtail drain placed in left lower quadrant abscess utilizing CT guidance. Electronically Signed   By: Miachel Roux M.D.   On: 01/24/2021 14:31        Scheduled Meds: . (feeding supplement) PROSource Plus  30 mL Oral BID BM  . allopurinol  100 mg Oral Daily  . brimonidine  1 drop Both Eyes BID  . Chlorhexidine Gluconate Cloth  6 each Topical Daily  . cycloSPORINE  1 drop Both Eyes BID  . dorzolamide  1 drop Both Eyes Daily  . feeding supplement  237 mL Oral BID BM  . FLUoxetine  40 mg Oral Daily  . furosemide  80 mg Intravenous BID  . insulin aspart  0-5 Units Subcutaneous QHS  . insulin aspart  0-9 Units Subcutaneous TID WC  . insulin glargine  5 Units  Subcutaneous BID  . latanoprost  1 drop Both Eyes QHS  . mouth rinse  15 mL Mouth Rinse BID  . multivitamin with minerals  1 tablet Oral Daily  . mycophenolate  180 mg Oral BID  . nutrition supplement (JUVEN)  1 packet Oral BID BM  . potassium chloride  40 mEq Oral Q4H  . predniSONE  7.5 mg Oral Q breakfast  . sodium chloride flush  3 mL Intravenous Q12H  . sodium chloride flush  5 mL Intracatheter Q8H  . tacrolimus  1 mg Oral BID  . vancomycin  125 mg Oral QID   Continuous Infusions: . ceFEPime (MAXIPIME) IV Stopped (01/25/21 1912)  . heparin 900 Units/hr (01/26/21 0600)  . metronidazole Stopped (01/26/21 0014)     LOS: 6 days     Cordelia Poche, MD [facility] 01/26/2021, 8:35 AM  If 7PM-7AM, please contact night-coverage [email or web address]

## 2021-01-27 DIAGNOSIS — N179 Acute kidney failure, unspecified: Secondary | ICD-10-CM | POA: Diagnosis not present

## 2021-01-27 DIAGNOSIS — A498 Other bacterial infections of unspecified site: Secondary | ICD-10-CM | POA: Diagnosis not present

## 2021-01-27 DIAGNOSIS — I5033 Acute on chronic diastolic (congestive) heart failure: Secondary | ICD-10-CM | POA: Diagnosis not present

## 2021-01-27 DIAGNOSIS — G9341 Metabolic encephalopathy: Secondary | ICD-10-CM | POA: Diagnosis not present

## 2021-01-27 LAB — CBC
HCT: 28 % — ABNORMAL LOW (ref 36.0–46.0)
Hemoglobin: 8.6 g/dL — ABNORMAL LOW (ref 12.0–15.0)
MCH: 32 pg (ref 26.0–34.0)
MCHC: 30.7 g/dL (ref 30.0–36.0)
MCV: 104.1 fL — ABNORMAL HIGH (ref 80.0–100.0)
Platelets: 166 10*3/uL (ref 150–400)
RBC: 2.69 MIL/uL — ABNORMAL LOW (ref 3.87–5.11)
RDW: 20.5 % — ABNORMAL HIGH (ref 11.5–15.5)
WBC: 6.2 10*3/uL (ref 4.0–10.5)
nRBC: 0 % (ref 0.0–0.2)

## 2021-01-27 LAB — GLUCOSE, CAPILLARY
Glucose-Capillary: 145 mg/dL — ABNORMAL HIGH (ref 70–99)
Glucose-Capillary: 120 mg/dL — ABNORMAL HIGH (ref 70–99)
Glucose-Capillary: 122 mg/dL — ABNORMAL HIGH (ref 70–99)
Glucose-Capillary: 131 mg/dL — ABNORMAL HIGH (ref 70–99)

## 2021-01-27 LAB — HEPARIN LEVEL (UNFRACTIONATED): Heparin Unfractionated: 0.48 IU/mL (ref 0.30–0.70)

## 2021-01-27 LAB — BASIC METABOLIC PANEL
Anion gap: 5 (ref 5–15)
BUN: 64 mg/dL — ABNORMAL HIGH (ref 8–23)
CO2: 22 mmol/L (ref 22–32)
Calcium: 8.9 mg/dL (ref 8.9–10.3)
Chloride: 106 mmol/L (ref 98–111)
Creatinine, Ser: 1.92 mg/dL — ABNORMAL HIGH (ref 0.44–1.00)
GFR, Estimated: 29 mL/min — ABNORMAL LOW (ref >60)
Glucose, Bld: 183 mg/dL — ABNORMAL HIGH (ref 70–99)
Potassium: 3.5 mmol/L (ref 3.5–5.1)
Sodium: 133 mmol/L — ABNORMAL LOW (ref 135–145)

## 2021-01-27 MED ORDER — METOPROLOL TARTRATE 25 MG PO TABS
25.0000 mg | ORAL_TABLET | Freq: Two times a day (BID) | ORAL | Status: DC
  Administered 2021-01-27 – 2021-02-04 (×16): 25 mg via ORAL
  Filled 2021-01-27 (×19): qty 1

## 2021-01-27 MED ORDER — ORAL CARE MOUTH RINSE
15.0000 mL | Freq: Two times a day (BID) | OROMUCOSAL | Status: DC
  Administered 2021-01-27 – 2021-01-30 (×5): 15 mL via OROMUCOSAL

## 2021-01-27 MED ORDER — PROMETHAZINE HCL 25 MG/ML IJ SOLN
25.0000 mg | Freq: Once | INTRAMUSCULAR | Status: AC
  Administered 2021-01-27: 25 mg via INTRAVENOUS
  Filled 2021-01-27: qty 1

## 2021-01-27 MED ORDER — POTASSIUM CHLORIDE CRYS ER 20 MEQ PO TBCR
40.0000 meq | EXTENDED_RELEASE_TABLET | Freq: Once | ORAL | Status: AC
  Administered 2021-01-27: 40 meq via ORAL
  Filled 2021-01-27: qty 2

## 2021-01-27 MED ORDER — CHLORHEXIDINE GLUCONATE 0.12 % MT SOLN
15.0000 mL | Freq: Two times a day (BID) | OROMUCOSAL | Status: DC
  Administered 2021-01-27 – 2021-02-04 (×14): 15 mL via OROMUCOSAL
  Filled 2021-01-27 (×15): qty 15

## 2021-01-27 MED ORDER — PROMETHAZINE HCL 25 MG/ML IJ SOLN
12.5000 mg | Freq: Four times a day (QID) | INTRAMUSCULAR | Status: DC | PRN
  Administered 2021-01-27 – 2021-02-02 (×7): 12.5 mg via INTRAVENOUS
  Filled 2021-01-27 (×7): qty 1

## 2021-01-27 NOTE — Progress Notes (Signed)
PROGRESS NOTE    April [name]  PYP:[id number] DOB: 1956/09/17 DOA: 01/01/2021 PCP: [name], MD   Brief Narrative: April [name] is a 65 y.o. female with a history of atrial flutter s/p DCCV, chronic respiratory failure on 2 lpm of oxygen, asthma, possible OSA, CKD stage IIIa, ESRD s/p renal transplant, diastolic heart failure, diabetes, Raynaud's phenomenon. Patient presented secondary to acute encephalopathy with associated hypotension in setting of intraabdominal fluid collection and recent intraabdominal surgery. General surgery and IR consulted for management. Patient managed empirically on antibiotics. Found to have C. Difficile and started on Vancomycin PO.   Assessment & Plan:   Principal Problem:   Acute metabolic encephalopathy Active Problems:   Renal transplant recipient   Depression   Acute respiratory failure with hypoxia (HCC)   Acute on chronic diastolic CHF (congestive heart failure) (HCC)   Acute renal failure superimposed on stage 3b chronic kidney disease (HCC)   Hypotension   Acute metabolic encephalopathy Unsure of etiology but resolved.  Abdominal fluid collection 3.2 x 2.8 x 5.3 cm abscess noted on CT scan. In setting of recent surgery. General surgery consulted who recommends IR drainage. Aspiration and drain placement performed by IR on 3/4. Preliminary culture data is significant for multiple organisms on gram stain -Continue Cefepime/Flagyl -Follow-up abscess culture data  Acute on chronic respiratory failure with hypoxia Unknown etiology but back to baseline 2 Lpm of oxygen. -Continue oxygen @ 2 lpm  AKI on CKD stage IIIa History of ESRD s/p renal transplant. Currently on mycophenolate, racrolimus. Left arm fistula. Baseline creatinine of 1.1-1.5 per chart review. Creatinine of 2.19 on admission in setting of hypotension in addition to heart failure. Improving with diuresis. -Lasix as mentioned below -Daily BMP  Acute on chronic  diastolic heart failure BNP significantly lower from baseline. Restarted lasix secondary to worsening AKI with significant edema -Daily BMP -Continue Lasix 80 mg IV BID -Daily weights and strict in/out  C. Difficile infection Mild symptoms. Positive antigen and toxin. Started on Vancomycin PO -Continue Vancomycin PO x 10 days  Acute on chronic anemia Normocytic anemia Iron deficiency anemia Acute anemia in setting of recent abdominal surgery. Currently stable. No current evidence of bleed -Daily CBC  Hypotension Patient received two 500 mL NS boluses. Does not appear to have had shock. Did not require vasopressor support. Resolved.  Acute urinary retention Unsure of etiology. Foley placed on admission -Voiding trial prior to discharge; will await until IV diuresis complete  Adrenal insufficiency Patient is on prednisone 7.5 mg daily as an outpatient. Started on stress dose steroids secondary   Typical atrial flutter Do not see mention of atrial fibrillation from last admission. Patient underwent Transesophageal Echocardiogram/cardioversion on 12/29/2020. Converted to sinus rhythm and started on Eliquis. Heparin IV started on admission. Metoprolol not restarted on admission and now with rapid rates -Continue heparin IV and transition back to Eliquis pending management of above -Resume metoprolol 25 mg BID  Diabetes mellitus, type 2 Patient is on Lantus as an outpaitnet  Raynaud's On amlodipine as an outpatient which was held secondary to hypotension.  Possible OSA -Continue CPAP qhs  Hyponatremia Mild. Patient hypovolemic on admission which may be etiology. Given NS boluses. Improving with lasix. -BMP daily  History of renal transplant -Continue home mycophenolate and tacrolimus  Primary hypertension Patient is on amlodipine, metoprolol and furosemide as an outpatient which were held secondary to hypotension.  Depression Patient is on Prozac as an outpatient -Continue  Prozac  Hypokalemia Secondary to diuresis -  Potassium supplementation as needed  Gout Patient is on colchicine and allopurinol as an outpatient -Continue allopurinol   DVT prophylaxis: Heparin IV Code Status:   Code Status: Full Code Family Communication: None at bedside. Disposition Plan: Discharge home vs SNF likely in several days pending general surgery/IR recommendations in addition to PT recommendations   Consultants:   General surgery  Interventional radiology  Procedures:   LLQ ABSCESS DRAIN (01/24/2021)  Antimicrobials:  Vancomycin IV  Vancomycin PO  Flagyl  Cefepime    Subjective: No concerns today. Rapid ventricular rates overnight.  Objective: Vitals:   01/27/21 0400 01/27/21 0500 01/27/21 0600 01/27/21 0800  BP: (!) 159/70  (!) 164/77   Pulse: 83 89 91   Resp: 16 20 16    Temp:    97.9 F (36.6 C)  TempSrc:    Oral  SpO2: 100% 100% 100%   Weight:      Height:        Intake/Output Summary (Last 24 hours) at 01/27/2021 1105 Last data filed at 01/27/2021 1000 Gross per 24 hour  Intake 1020.79 ml  Output 1187.5 ml  Net -166.71 ml   Filed Weights   12/25/2020 1855 01/21/21 0749 01/22/21 0500  Weight: 71.4 kg 74.2 kg 73.3 kg    Examination:  General exam: Appears calm and comfortable Respiratory system: Clear to auscultation. Respiratory effort normal. Cardiovascular system: S1 & S2 heard, RRR. No murmurs, rubs, gallops or clicks. Gastrointestinal system: Abdomen is nondistended, soft and mildly tender. Decreased bowel sounds heard. Central nervous system: Alert and oriented. No focal neurological deficits. Musculoskeletal: Upper/lower extremity edema. No calf tenderness Skin: No cyanosis. No rashes Psychiatry: Judgement and insight appear normal. Mood & affect appropriate.    Data Reviewed: I have personally reviewed following labs and imaging studies  CBC Lab Results  Component Value Date   WBC 6.2 01/27/2021   RBC 2.69 (L)  01/27/2021   HGB 8.6 (L) 01/27/2021   HCT 28.0 (L) 01/27/2021   MCV 104.1 (H) 01/27/2021   MCH 32.0 01/27/2021   PLT 166 01/27/2021   MCHC 30.7 01/27/2021   RDW 20.5 (H) 01/27/2021   LYMPHSABS 0.4 (L) 01/22/2021   MONOABS 0.1 01/22/2021   EOSABS 0.0 01/22/2021   BASOSABS 0.0 81/27/5170     Last metabolic panel Lab Results  Component Value Date   NA 133 (L) 01/27/2021   K 3.5 01/27/2021   CL 106 01/27/2021   CO2 22 01/27/2021   BUN 64 (H) 01/27/2021   CREATININE 1.92 (H) 01/27/2021   GLUCOSE 183 (H) 01/27/2021   GFRNONAA 29 (L) 01/27/2021   GFRAA 51 (L) 08/05/2020   CALCIUM 8.9 01/27/2021   PHOS 4.1 01/21/2021   PROT 5.7 (L) 01/22/2021   ALBUMIN 2.1 (L) 01/22/2021   LABGLOB 3.8 11/04/2020   AGRATIO 0.9 05/31/2020   BILITOT 1.1 01/22/2021   ALKPHOS 80 01/22/2021   AST 20 01/22/2021   ALT 9 01/22/2021   ANIONGAP 5 01/27/2021    CBG (last 3)  Recent Labs    01/26/21 1631 01/26/21 2127 01/27/21 0809  GLUCAP 201* 197* 145*     GFR: Estimated Creatinine Clearance: 27.5 mL/min (A) (by C-G formula based on SCr of 1.92 mg/dL (H)).  Coagulation Profile: Recent Labs  Lab 12/31/2020 1339 01/21/21 0239 01/22/21 0258 01/23/21 0248 01/24/21 0252  INR 2.8* 3.1* 2.2* 1.6* 1.9*    Recent Results (from the past 240 hour(s))  Resp Panel by RT-PCR (Flu A&B, Covid) Nasopharyngeal Swab     Status:  None   Collection Time: 12/29/2020  2:05 PM   Specimen: Nasopharyngeal Swab; Nasopharyngeal(NP) swabs in vial transport medium  Result Value Ref Range Status   SARS Coronavirus 2 by RT PCR NEGATIVE NEGATIVE Final    Comment: (NOTE) SARS-CoV-2 target nucleic acids are NOT DETECTED.  The SARS-CoV-2 RNA is generally detectable in upper respiratory specimens during the acute phase of infection. The lowest concentration of SARS-CoV-2 viral copies this assay can detect is 138 copies/mL. A negative result does not preclude SARS-Cov-2 infection and should not be used as the sole  basis for treatment or other patient management decisions. A negative result may occur with  improper specimen collection/handling, submission of specimen other than nasopharyngeal swab, presence of viral mutation(s) within the areas targeted by this assay, and inadequate number of viral copies(<138 copies/mL). A negative result must be combined with clinical observations, patient history, and epidemiological information. The expected result is Negative.  Fact Sheet for Patients:  EntrepreneurPulse.com.au  Fact Sheet for Healthcare Providers:  IncredibleEmployment.be  This test is no t yet approved or cleared by the [place] FDA and  has been authorized for detection and/or diagnosis of SARS-CoV-2 by FDA under an Emergency Use Authorization (EUA). This EUA will remain  in effect (meaning this test can be used) for the duration of the COVID-19 declaration under Section 564(b)(1) of the Act, 21 U.S.C.section 360bbb-3(b)(1), unless the authorization is terminated  or revoked sooner.       Influenza A by PCR NEGATIVE NEGATIVE Final   Influenza B by PCR NEGATIVE NEGATIVE Final    Comment: (NOTE) The Xpert Xpress SARS-CoV-2/FLU/RSV plus assay is intended as an aid in the diagnosis of influenza from Nasopharyngeal swab specimens and should not be used as a sole basis for treatment. Nasal washings and aspirates are unacceptable for Xpert Xpress SARS-CoV-2/FLU/RSV testing.  Fact Sheet for Patients: EntrepreneurPulse.com.au  Fact Sheet for Healthcare Providers: IncredibleEmployment.be  This test is not yet approved or cleared by the [place] FDA and has been authorized for detection and/or diagnosis of SARS-CoV-2 by FDA under an Emergency Use Authorization (EUA). This EUA will remain in effect (meaning this test can be used) for the duration of the COVID-19 declaration under Section 564(b)(1) of the Act,  21 U.S.C. section 360bbb-3(b)(1), unless the authorization is terminated or revoked.  Performed at [facility], [place] [address], [place], [place] [address]   Culture, blood (Routine x 2)     Status: None   Collection Time: 01/13/2021  3:28 PM   Specimen: BLOOD  Result Value Ref Range Status   Specimen Description   Final    BLOOD RIGHT ARM Performed at [place] [address] [place], [place] [address]    Special Requests   Final    BOTTLES DRAWN AEROBIC AND ANAEROBIC Blood Culture adequate volume Performed at [place] [address], [place], [place] [address]    Culture   Final    NO GROWTH 5 DAYS Performed at [facility], [place] [address], [place], [place] [address]    Report Status 01/25/2021 FINAL  Final  MRSA PCR Screening     Status: None   Collection Time: 12/24/2020  6:20 PM   Specimen: Nasopharyngeal  Result Value Ref Range Status   MRSA by PCR NEGATIVE NEGATIVE Final    Comment:        The GeneXpert MRSA Assay (FDA approved for NASAL specimens only), is one component of a comprehensive  MRSA colonization surveillance program. It is not intended to diagnose MRSA infection nor to guide or monitor treatment for MRSA infections. Performed at [facility], [place] [address], [place], [place] [address]   Urine culture     Status: None   Collection Time: 12/24/2020  6:21 PM   Specimen: Urine, Random  Result Value Ref Range Status   Specimen Description   Final    URINE, RANDOM Performed at [place] [address] [place], [place] [address]    Special Requests   Final    NONE Performed at [facility], [place] [address] [place], [place] [address]    Culture   Final    NO GROWTH Performed at [facility], [place] [address] [place], [place] [address]    Report Status 01/22/2021 FINAL  Final  C Difficile Quick Screen w PCR reflex      Status: Abnormal   Collection Time: 01/21/21  3:43 PM   Specimen: STOOL  Result Value Ref Range Status   C Diff antigen POSITIVE (A) NEGATIVE Final   C Diff toxin POSITIVE (A) NEGATIVE Final   C Diff interpretation Toxin producing C. difficile detected.  Final    Comment: CRITICAL RESULT CALLED TO, READ BACK BY AND VERIFIED WITH: P.MERCADO AT 2017 ON 03.01.22 BY N.THOMPSON Performed at [facility], [place] [address] [place], [place] [address]   Aerobic/Anaerobic Culture (surgical/deep wound)     Status: Abnormal (Preliminary result)   Collection Time: 01/24/21  1:08 PM   Specimen: Abscess  Result Value Ref Range Status   Specimen Description   Final    ABSCESS Performed at [place] [address], [place], [place] [address]    Special Requests   Final    NONE Performed at [facility], [place] [address], [name], [place] [address]    Gram Stain   Final    MODERATE WBC PRESENT, PREDOMINANTLY PMN MODERATE GRAM POSITIVE COCCI IN PAIRS IN CLUSTERS FEW GRAM POSITIVE RODS RARE GRAM NEGATIVE RODS Performed at [facility], [place] [address], [place], [place] [address]    Culture MULTIPLE ORGANISMS PRESENT, NONE PREDOMINANT (A)  Final   Report Status PENDING  Incomplete        Radiology Studies: No results found.      Scheduled Meds: . (feeding supplement) PROSource Plus  30 mL Oral BID BM  . allopurinol  100 mg Oral Daily  . brimonidine  1 drop Both Eyes BID  . Chlorhexidine Gluconate Cloth  6 each Topical Daily  . cycloSPORINE  1 drop Both Eyes BID  . dorzolamide  1 drop Both Eyes Daily  . feeding supplement  237 mL Oral BID BM  . FLUoxetine  40 mg Oral Daily  . furosemide  80 mg Intravenous BID  . insulin aspart  0-5 Units Subcutaneous QHS  . insulin aspart  0-9 Units Subcutaneous TID WC  . insulin glargine  5 Units Subcutaneous BID  . latanoprost  1 drop Both Eyes QHS  . mouth rinse  15 mL Mouth Rinse  BID  . metoprolol tartrate  25 mg Oral BID  . multivitamin with minerals  1 tablet Oral Daily  . mycophenolate  180 mg Oral BID  . nutrition supplement (JUVEN)  1 packet Oral BID BM  . predniSONE  7.5 mg Oral Q breakfast  . sodium chloride flush  3 mL Intravenous Q12H  . sodium chloride flush  5 mL Intracatheter Q8H  . tacrolimus  1 mg Oral BID  . vancomycin  125 mg Oral QID   Continuous Infusions: . sodium chloride 10 mL/hr at 01/27/21 1000  . sodium chloride Stopped (01/27/21 0925)  . ceFEPime (MAXIPIME) IV Stopped (01/26/21 2335)  . heparin 900 Units/hr (01/27/21 1000)  . metronidazole 100 mL/hr at 01/27/21 1000     LOS: 7 days     Cordelia Poche, MD [facility] 01/27/2021, 11:05 AM  If 7PM-7AM, please contact night-coverage [email or web address]

## 2021-01-27 NOTE — Progress Notes (Signed)
ANTICOAGULATION CONSULT NOTE - Follow Up Consult  Pharmacy Consult for Heparin Indication: atrial fibrillation  Allergies  Allergen Reactions  . Infed [Iron Dextran] Other (See Comments)    Chest tightness  . Pentamidine Itching, Shortness Of Breath and Swelling  . Budesonide-Formoterol Fumarate     Other reaction(s): hoarseness  . Bupropion     Other reaction(s): exacerbated depression  . Crestor [Rosuvastatin]     Other reaction(s): body aches, swelling  . Duloxetine Hcl     Other reaction(s): GI SE, weepy, worsening fatigue, foggy  . Erythromycin [Erythromycin] Other (See Comments)    Mouth Ulcers  . Iohexol Other (See Comments)    Per patient "she has had a kidney transplant and should never have contrast"  . Oxycodone Nausea Only and Nausea And Vomiting  . Pravastatin Sodium     Other reaction(s): myalgias  . Erythromycin Rash    Causes breakout in mouth  . Ultram [Tramadol Hcl] Anxiety    Patient Measurements: Height: 5\' 1"  (154.9 cm) Weight:  (UTA - bed scale malfunctioning) IBW/kg (Calculated) : 47.8 Heparin Dosing Weight: 63kg  Vital Signs: Temp: 97.9 F (36.6 C) (03/07 0800) Temp Source: Oral (03/07 0800) BP: 164/77 (03/07 0600) Pulse Rate: 91 (03/07 0600)  Labs: Recent Labs    01/25/21 0523 01/25/21 1458 01/26/21 0145 01/26/21 1208 01/27/21 0338  HGB 8.5*  --  8.9*  --  8.6*  HCT 27.7*  --  28.0*  --  28.0*  PLT 158  --  223  --  166  APTT 56* 56* 105*  --   --   HEPARINUNFRC 0.61 0.53 0.52 0.59 0.48  CREATININE 2.00*  --  2.01* 2.12* 1.92*    Estimated Creatinine Clearance: 27.5 mL/min (A) (by C-G formula based on SCr of 1.92 mg/dL (H)).   Assessment: 49 yoF with PMH PAF s/p DCCV (12/25/20) on Eliquis, chronic resp fail (2L O2 baseline), CKD3 s/p renal txplant, dCHF, DM2, admitted after being found unresponsive at [facility]. Concern for infection after recent abdominal surgery; Pharmacy consulted to convert Eliquis to IV heparin while possible  procedures pending. During last admission, heparin was especially difficult to control, with rates ranging from 1650 units/hr down to 250 units/hr. Patient also noted to have significant bruising and hematomas on several extremities last admission. However, Cardiology has recommended at least 30 days of anticoagulation after DCCV (through 3/4).  PTA Eliquis 5mg  BID, last dose 2/28 09:00.  Baseline INR elevated; aPTT not done  3/4: s/p LLQ abscess drain.  Heparin off 0900-2130.  Resumed without bolus.   Today, 01/27/2021:   Heparin level 0.48; therapeutic, slightly increased despite rate decrease to heparin 900 units/hr.      CBC: Hgb 8.6 stable, Plt 166  Known bruising.  No bleeding reported.  No infusion problems.  Goal of Therapy:  Heparin level 0.3-0.7 units/ml aPTT 66-102 seconds Monitor platelets by anticoagulation protocol: Yes   Plan:   Continue heparin 900 units/hr   Daily CBC and heparin level  Monitor for s/sx of bleeding  Follow up plans for long-term oral anticoagulation.   Ulice Dash D  01/27/2021 11:53 AM

## 2021-01-27 NOTE — Progress Notes (Signed)
Referring Physician(s): [place] Physician: Dr. Vernard Gambles  Patient Status:  [facility] - In-pt  Chief Complaint:  Abdominal pain/ fluid collection/abscess  Subjective: Tired appearing No new c/o    Allergies: Infed [iron dextran], Pentamidine, Budesonide-formoterol fumarate, Bupropion, Crestor [rosuvastatin], Duloxetine hcl, Erythromycin [erythromycin], Iohexol, Oxycodone, Pravastatin sodium, Erythromycin, and Ultram [tramadol hcl]  Medications:  Current Facility-Administered Medications:  .  (feeding supplement) PROSource Plus liquid 30 mL, 30 mL, Oral, BID BM, Choi, Jennifer, DO, 30 mL at 01/27/21 1145 .  0.9 %  sodium chloride infusion, , Intravenous, PRN, Mariel Aloe, MD, Last Rate: 10 mL/hr at 01/27/21 1400, Infusion Verify at 01/27/21 1400 .  0.9 %  sodium chloride infusion, , Intravenous, PRN, Mariel Aloe, MD, Last Rate: 10 mL/hr at 01/27/21 1400, Infusion Verify at 01/27/21 1400 .  acetaminophen (TYLENOL) tablet 650 mg, 650 mg, Oral, Q6H PRN, 650 mg at 01/24/21 2158 **OR** acetaminophen (TYLENOL) suppository 650 mg, 650 mg, Rectal, Q6H PRN, Dessa Phi, DO .  allopurinol (ZYLOPRIM) tablet 100 mg, 100 mg, Oral, Daily, Dessa Phi, DO, 100 mg at 01/27/21 0930 .  alum & mag hydroxide-simeth (MAALOX/MYLANTA) 200-200-20 MG/5ML suspension 15 mL, 15 mL, Oral, Q4H PRN, Mariel Aloe, MD, 15 mL at 01/26/21 1306 .  brimonidine (ALPHAGAN) 0.15 % ophthalmic solution 1 drop, 1 drop, Both Eyes, BID, Dessa Phi, DO, 1 drop at 01/27/21 0935 .  ceFEPIme (MAXIPIME) 2 g in sodium chloride 0.9 % 100 mL IVPB, 2 g, Intravenous, Q24H, Adrian Saran, [place], Stopped at 01/26/21 2335 .  chlorhexidine (PERIDEX) 0.12 % solution 15 mL, 15 mL, Mouth Rinse, BID, Mariel Aloe, MD, 15 mL at 01/27/21 1310 .  Chlorhexidine Gluconate Cloth 2 % PADS 6 each, 6 each, Topical, Daily, Dessa Phi, DO, 6 each at 01/27/21 1145 .  cycloSPORINE (RESTASIS) 0.05 % ophthalmic emulsion 1  drop, 1 drop, Both Eyes, BID, Dessa Phi, DO, 1 drop at 01/27/21 0933 .  dorzolamide (TRUSOPT) 2 % ophthalmic solution 1 drop, 1 drop, Both Eyes, Daily, Dessa Phi, DO, 1 drop at 01/27/21 0934 .  feeding supplement (ENSURE ENLIVE / ENSURE PLUS) liquid 237 mL, 237 mL, Oral, BID BM, Dessa Phi, DO, 237 mL at 01/26/21 1604 .  FLUoxetine (PROZAC) capsule 40 mg, 40 mg, Oral, Daily, Dessa Phi, DO, 40 mg at 01/27/21 0930 .  furosemide (LASIX) injection 80 mg, 80 mg, Intravenous, BID, Mariel Aloe, MD, 80 mg at 01/27/21 0921 .  heparin ADULT infusion 100 units/mL (25000 units/214mL), 900 Units/hr, Intravenous, Continuous, Thomes Lolling, RPH, Last Rate: 9 mL/hr at 01/27/21 1400, 900 Units/hr at 01/27/21 1400 .  insulin aspart (novoLOG) injection 0-5 Units, 0-5 Units, Subcutaneous, QHS, Dessa Phi, DO, 3 Units at 01/25/21 2308 .  insulin aspart (novoLOG) injection 0-9 Units, 0-9 Units, Subcutaneous, TID WC, Dessa Phi, DO, 1 Units at 01/27/21 [phone] .  insulin glargine (LANTUS) injection 5 Units, 5 Units, Subcutaneous, BID, Dessa Phi, DO, 5 Units at 01/27/21 [phone] .  latanoprost (XALATAN) 0.005 % ophthalmic solution 1 drop, 1 drop, Both Eyes, QHS, Choi, Jennifer, DO, 1 drop at 01/26/21 2248 .  MEDLINE mouth rinse, 15 mL, Mouth Rinse, q12n4p, Nettey, Ralph A, MD .  metoprolol tartrate (LOPRESSOR) tablet 25 mg, 25 mg, Oral, BID, Mariel Aloe, MD, 25 mg at 01/27/21 1144 .  metroNIDAZOLE (FLAGYL) IVPB 500 mg, 500 mg, Intravenous, Q8H, Dessa Phi, DO, Stopped at 01/27/21 1025 .  multivitamin with minerals tablet 1 tablet, 1 tablet, Oral, Daily,  Dessa Phi, DO, 1 tablet at 01/27/21 0930 .  mycophenolate (MYFORTIC) EC tablet 180 mg, 180 mg, Oral, BID, Dessa Phi, DO, 180 mg at 01/27/21 0930 .  nutrition supplement (JUVEN) (JUVEN) powder packet 1 packet, 1 packet, Oral, BID BM, Dessa Phi, DO, 1 packet at 01/27/21 [id number] .  ondansetron (ZOFRAN) tablet 4 mg, 4 mg,  Oral, Q6H PRN **OR** ondansetron (ZOFRAN) injection 4 mg, 4 mg, Intravenous, Q6H PRN, Dessa Phi, DO, 4 mg at 01/27/21 0921 .  predniSONE (DELTASONE) tablet 7.5 mg, 7.5 mg, Oral, Q breakfast, Mariel Aloe, MD, 7.5 mg at 01/27/21 0930 .  promethazine (PHENERGAN) injection 12.5 mg, 12.5 mg, Intravenous, Q6H PRN, Mariel Aloe, MD, 12.5 mg at 01/27/21 1144 .  sodium chloride flush (NS) 0.9 % injection 3 mL, 3 mL, Intravenous, Q12H, Dessa Phi, DO, 3 mL at 01/27/21 0932 .  sodium chloride flush (NS) 0.9 % injection 5 mL, 5 mL, Intracatheter, Q8H, Mir, Paula Libra, MD, 5 mL at 01/27/21 1312 .  tacrolimus (PROGRAF) capsule 1 mg, 1 mg, Oral, BID, Dessa Phi, DO, 1 mg at 01/27/21 0930 .  vancomycin (VANCOCIN) 50 mg/mL oral solution 125 mg, 125 mg, Oral, QID, Opyd, Ilene Qua, MD, 125 mg at 01/27/21 1311    Vital Signs: BP (!) 156/57   Pulse 66   Temp 98 F (36.7 C) (Oral)   Resp 17   Ht 5\' 1"  (1.549 m)   Wt 73.3 kg   SpO2 100%   BMI 30.53 kg/m   Physical Exam  lower abd drain intact , insertion site ok, OP more thin/clear today  Imaging: CT IMAGE GUIDED DRAINAGE BY PERCUTANEOUS CATHETER  Result Date: 01/24/2021 INDICATION: 65 year old woman with left lower quadrant abscess presents to interventional radiology for CT-guided drain placement. EXAM: CT GUIDED DRAINAGE OF LEFT LOWER QUADRANT ABSCESS MEDICATIONS: The patient is currently admitted to the hospital and receiving intravenous antibiotics. The antibiotics were administered within an appropriate time frame prior to the initiation of the procedure. ANESTHESIA/SEDATION: 1 mg IV Versed 50 mcg IV Fentanyl Moderate Sedation Time:  11 minutes The patient was continuously monitored during the procedure by the interventional radiology nurse under my direct supervision. COMPLICATIONS: None immediate. TECHNIQUE: Informed written consent was obtained from the patient after a thorough discussion of the procedural risks, benefits and  alternatives. All questions were addressed. Maximal Sterile Barrier Technique was utilized including caps, mask, sterile gowns, sterile gloves, sterile drape, hand hygiene and skin antiseptic. A timeout was performed prior to the initiation of the procedure. PROCEDURE: Patient positioned supine on the procedure table. The left lower quadrant anterior abdominal wall skin was prepped with Chlorhexidine in a sterile fashion, and a sterile drape was applied covering the operative field. A sterile gown and sterile gloves were used for the procedure. Local anesthesia was provided with 1% Lidocaine. Following local lidocaine administration, 18 gauge needle was advanced into the left lower quadrant collection utilizing CT guidance. Purulent material was noted to return from the needle. The needle was removed over 0.035 inch guidewire. 10.2 [place] multipurpose pigtail drain inserted over the guidewire. 30 mL of purulent material aspirated. Samples sent for Gram stain and culture. Drain secured to skin with suture and connected to bulb suction. FINDINGS: Left lower quadrant abscess IMPRESSION: 10.2 [place] multipurpose pigtail drain placed in left lower quadrant abscess utilizing CT guidance. Electronically Signed   By: Miachel Roux M.D.   On: 01/24/2021 14:31    Labs:  CBC: Recent Labs    01/24/21  3154 01/25/21 0523 01/26/21 0145 01/27/21 0338  WBC 5.3 4.5 8.5 6.2  HGB 8.3* 8.5* 8.9* 8.6*  HCT 26.7* 27.7* 28.0* 28.0*  PLT 178 158 223 166    COAGS: Recent Labs    01/21/21 0239 01/21/21 1000 01/22/21 0258 01/23/21 0248 01/23/21 1304 01/24/21 0252 01/25/21 0523 01/25/21 1458 01/26/21 0145  INR 3.1*  --  2.2* 1.6*  --  1.9*  --   --   --   APTT 133*   < > 68* 57*   < > 61* 56* 56* 105*   < > = values in this interval not displayed.    BMP: Recent Labs    07/01/20 2334 07/02/20 0030 07/03/20 0350 07/18/20 1545 08/05/20 1523 09/02/20 2049 01/25/21 0523 01/26/21 0145 01/26/21 1208  01/27/21 0338  NA 135   < > 139 131* 140   < > 133* 133* 134* 133*  K 4.4   < > 5.1 4.7 4.5   < > 3.8 3.1* 3.3* 3.5  CL 102   < > 108 95* 106   < > 105 105 103 106  CO2 25  --  20* 24 27   < > 17* 19* 20* 22  GLUCOSE 135*   < > 187* 187* 189*   < > 217* 214* 200* 183*  BUN 28*   < > 42* 30* 45*   < > 53* 65* 65* 64*  CALCIUM 9.7  --  9.0 9.4 10.1   < > 9.0 8.9 8.9 8.9  CREATININE 1.02*   < > 1.17* 1.39* 1.30*   < > 2.00* 2.01* 2.12* 1.92*  GFRNONAA 58*  --  50* 40* 44*   < > 28* 27* 26* 29*  GFRAA >60  --  57* 47* 51*  --   --   --   --   --    < > = values in this interval not displayed.    LIVER FUNCTION TESTS: Recent Labs    01/03/21 0300 01/04/21 0556 01/08/21 1021 01/09/21 0511 01/12/21 0250 01/13/21 0049 01/19/2021 1339 01/22/21 0258  BILITOT 1.4*  --  1.5*  --   --   --  1.2 1.1  AST 11*  --  10*  --   --   --  25 20  ALT 11  --  8  --   --   --  8 9  ALKPHOS 53  --  62  --   --   --  94 80  PROT 5.6*  --  5.4*  --   --   --  6.0* 5.7*  ALBUMIN 2.3*   < > 2.3*   < > 1.9* 2.0* 1.9* 2.1*   < > = values in this interval not displayed.    Assessment and Plan: Pt with hx perf sig diverticulitis, s/p expl lap with sig colectomy/end colostomy 12/29/20; post op left lower abd fluid coll/abscess, s/p drain placement 3/4 Cx pending, gram stain mixed positive so far IR following   Electronically Signed: Ascencion Dike, PA-C 01/27/2021, 2:56 PM   I spent a total of 15 minutes at the the patient's bedside AND on the patient's hospital floor or unit, greater than 50% of which was counseling/coordinating care for left lower abdominal abscess drain

## 2021-01-27 NOTE — TOC Progression Note (Signed)
Transition of Care [facility]) - Progression Note    Patient Details  Name: April [name] MRN: [id number] Date of Birth: Oct 10, 1956  Transition of [facility] CM/SW Contact  Leeroy Cha, RN Phone Number: 01/27/2021, 8:14 AM  Clinical Narrative:    Assessment & Plan:   Principal Problem:   Acute metabolic encephalopathy Active Problems:   Renal transplant recipient   Depression   Acute respiratory failure with hypoxia ([place])   Acute on chronic diastolic CHF (congestive heart failure) ([place])   Acute renal failure superimposed on stage 3b chronic kidney disease ([place])   Hypotension   Acute metabolic encephalopathy Unsure of etiology but resolved.  Abdominal fluid collection 3.2 x 2.8 x 5.3 cm abscess noted on CT scan. In setting of recent surgery. General surgery consulted who recommends IR drainage. Aspiration and drain placement performed by IR on 3/4. Preliminary culture data is significant for multiple organisms on gram stain -Continue Cefepime/Flagyl -Follow-up abscess culture data  Acute on chronic respiratory failure with hypoxia Unknown etiology but back to baseline 2 Lpm of oxygen. -Continue oxygen @ 2 lpm  AKI on CKD stage IIIa History of ESRD s/p renal transplant. Currently on mycophenolate, racrolimus. Left arm fistula. Baseline creatinine of 1.1-1.5 per chart review. Creatinine of 2.19 on admission in setting of hypotension in addition to heart failure. Improving with diuresis. -Lasix as mentioned below -Daily BMP  Acute on chronic diastolic heart failure BNP significantly lower from baseline. Restarted lasix secondary to worsening AKI with significant edema -Daily BMP -Continue Lasix 80 mg IV BID -Daily weights and strict in/out  C. Difficile infection Mild symptoms. Positive antigen and toxin. Started on Vancomycin PO -Continue Vancomycin PO x 10 days  Acute on chronic anemia Normocytic anemia Iron deficiency anemia Acute anemia in setting of  recent abdominal surgery. Currently stable. No current evidence of bleed -Daily CBC  Hypotension Patient received two 500 mL NS boluses. Does not appear to have had shock. Did not require vasopressor support. Resolved.  Acute urinary retention Unsure of etiology. Foley placed on admission -Voiding trial prior to discharge; will await until IV diuresis complete  Adrenal insufficiency Patient is on prednisone 7.5 mg daily as an outpatient. Started on stress dose steroids secondary   Typical atrial flutter Do not see mention of atrial fibrillation from last admission. Patient underwent Transesophageal Echocardiogram/cardioversion on 12/29/2020. Converted to sinus rhythm and started on Eliquis. Heparin IV started on admission. -Continue heparin IV and transition back to Eliquis pending management of above  PLAN:  To return to home with self care, following for toc needs Expected Discharge Plan: [place] Barriers to Discharge: Continued Medical Work up  Expected Discharge Plan and Services Expected Discharge Plan: [place]   Discharge Planning Services: CM Consult Post Acute Care Choice: [place] Living arrangements for the past 2 months: Single Family Home                                       Social Determinants of Health (SDOH) Interventions    Readmission Risk Interventions Readmission Risk Prevention Plan 05/15/2020 04/19/2020 02/09/2020  Transportation Screening Complete Complete Complete  PCP or Specialist Appt within 3-5 Days - - -  Not Complete comments - - -  [place]RI or [place] or Home Care Consult comments - - -  Social Work Consult for [place] Planning/Counseling - - -  SW consult not completed comments - - -  Palliative Care Screening - - -  Medication Review (RN Care Manager) Referral to Pharmacy Complete Referral to Pharmacy  PCP or Specialist appointment within 3-5 days of discharge  Complete Complete Complete  HRI or Home Care Consult Complete Complete Complete  SW Recovery Care/Counseling Consult Complete Complete -  Palliative Care Screening Not Applicable Not Applicable Complete  Skilled Nursing Facility Complete Complete Patient Refused  Some recent data might be hidden

## 2021-01-27 NOTE — Progress Notes (Signed)
Subjective/Chief Complaint: Pt very lethargic this morning and doesn't seem to feel well   Objective: Vital signs in last 24 hours: Temp:  [97.4 F (36.3 C)-98.1 F (36.7 C)] 97.4 F (36.3 C) (03/07 0341) Pulse Rate:  [82-97] 91 (03/07 0600) Resp:  [12-24] 16 (03/07 0600) BP: (132-166)/(61-97) 164/77 (03/07 0600) SpO2:  [100 %] 100 % (03/07 0600) Last BM Date: 01/26/21  Intake/Output from previous day: 03/06 0701 - 03/07 0700 In: 853.6 [I.V.:446.9; IV Piggyback:396.8] Out: 1185 [Urine:1000; Drains:10; Stool:175] Intake/Output this shift: No intake/output data recorded.  PE: Abd: soft, drain with clear type output from IR drain, midline wound with fascial dehiscence but granulating in.  mepitel in place with NS WD.  No further green noted on dressing this morning.  Ostomy working well.  Stoma is pink  Lab Results:  Recent Labs    01/26/21 0145 01/27/21 0338  WBC 8.5 6.2  HGB 8.9* 8.6*  HCT 28.0* 28.0*  PLT 223 166   BMET Recent Labs    01/26/21 1208 01/27/21 0338  NA 134* 133*  K 3.3* 3.5  CL 103 106  CO2 20* 22  GLUCOSE 200* 183*  BUN 65* 64*  CREATININE 2.12* 1.92*  CALCIUM 8.9 8.9   PT/INR No results for input(s): LABPROT, INR in the last 72 hours. ABG No results for input(s): PHART, HCO3 in the last 72 hours.  Invalid input(s): PCO2, PO2  Studies/Results: No results found.  Anti-infectives: Anti-infectives (From admission, onward)   Start     Dose/Rate Route Frequency Ordered Stop   01/21/21 2200  vancomycin (VANCOCIN) 50 mg/mL oral solution 125 mg        125 mg Oral 4 times daily 01/21/21 2028 01/31/21 2159   01/21/21 1800  ceFEPIme (MAXIPIME) 2 g in sodium chloride 0.9 % 100 mL IVPB        2 g 200 mL/hr over 30 Minutes Intravenous Every 24 hours 01/10/2021 1742     01/21/21 0000  metroNIDAZOLE (FLAGYL) IVPB 500 mg        500 mg 100 mL/hr over 60 Minutes Intravenous Every 8 hours 01/16/2021 1821     01/01/2021 1800  vancomycin (VANCOCIN) IVPB  1000 mg/200 mL premix  Status:  Discontinued        1,000 mg 200 mL/hr over 60 Minutes Intravenous Every 48 hours 12/28/2020 1742 01/22/21 1053   01/03/2021 1545  ceFEPIme (MAXIPIME) 2 g in sodium chloride 0.9 % 100 mL IVPB        2 g 200 mL/hr over 30 Minutes Intravenous  Once 01/08/2021 1544 01/15/2021 1625   12/25/2020 1545  metroNIDAZOLE (FLAGYL) IVPB 500 mg        500 mg 100 mL/hr over 60 Minutes Intravenous  Once 01/04/2021 1544 01/04/2021 1730   01/01/2021 1545  vancomycin (VANCOCIN) IVPB 1000 mg/200 mL premix  Status:  Discontinued        1,000 mg 200 mL/hr over 60 Minutes Intravenous  Once 01/10/2021 1544 12/25/2020 1847      Assessment/Plan: Polycystic disease/ESRDwith renal transplant 2013-Prograf/prednisone. Elevated Cr- 2.19>>2.09 Hx CHF Chronic COPD/history of tobacco use- On 2L PRN at home -Mostly off CPAP now. HxAge indeterminate, non occlusive deep vein thrombosis involving the right popliteal vein Hx SVT/Atrial flutter with RVR/TEE/DC cardioversion 12/25/20 Large hematoma left arm - drained and dressed- cleaning up hematoma still present Anemia- H/H 8.5/27.1>>7.3/24.1 Severe protein calorie malnutrition-Total protein 6.0/albumin 1.9 Hyponatremia Hypercoagulable - INR 2.8 (on Eliquis) Severe malnutrition-prealbumin<5 C. difficile colitis -antigen/toxin + 01/21/2021, tx  per medicine  Sepsis and 3.2 x 2.8 x 5.3 cm abdominal fluid/abscess collectionadjacent to enterotomy staple line HxPerforated sigmoid diverticulitis -S/pExploratory laparotomy with sigmoid colectomy/end colostomy, 12/29/2020 Dr. Coralie Keens -IR drain placed 3/4, cx shows gram + rods and cocci, and gram - rods -cont local wound care with mepitel over wound base and NS WD dressing changes BID.   FEN:D3 diet, supplements JQ:[id number]/[id number] 2/28>>, Vancomycin IV 2/28 - Vancomycin PO 3/1 >> DZH:[id number] gtt   Plan:  -IV abx -con't dressing changes   LOS: 7 days    Henreitta Cea 01/27/2021

## 2021-01-27 NOTE — Progress Notes (Addendum)
01/27/2021 Due to earlier episode of nausea, patient unable to take PO meds at 2200 hours. Re-attempted after adminstering ondansetron, however patient states still feels unable to take POs due to nausea and dizzy feeling. Provider notified. No new orders. Cindy S. Brigitte Pulse BSN, RN, [place] 01/27/2021 3:09 AM  01/27/2021 0400 Patient states continued nausea when awakened. Contacted provider for Phenergan per patient request. Gwynn Burly. Brigitte Pulse BSN, RN, [place] 01/27/2021 6:07 AM

## 2021-01-27 NOTE — Progress Notes (Signed)
Pt refuses to wear nocturnal cpap tonight, stating the mask makes her feel claustrophobic.

## 2021-01-27 NOTE — Evaluation (Signed)
Speech Language Pathology Evaluation Patient Details Name: April [name] MRN: [id number] DOB: [birth year] Today's Date: 01/27/2021 Time: 4193-7902 SLP Time Calculation (min) (ACUTE ONLY): 20 min  Problem List:  Patient Active Problem List   Diagnosis Date Noted  . Hypotension 12/24/2020  . Acute metabolic encephalopathy 40/97/3532  . Difficult intravenous access   . Bilious vomiting with nausea   . Sigmoid diverticulitis 12/16/2020  . Acute CHF (congestive heart failure) ([place]) 12/16/2020  . Fever of unknown origin 10/24/2020  . Fever of unknown origin (FUO) 10/23/2020  . Lower GI bleeding 09/25/2020  . Diarrhea 09/10/2020  . Acute renal failure superimposed on stage 3b chronic kidney disease ([place]) 09/08/2020  . Acute diverticulitis 09/04/2020  . SIRS (systemic inflammatory response syndrome) ([place]) 07/02/2020  . Chronic kidney disease, stage 3a ([place])   . Kappa light chain disease ([place]) 06/19/2020  . History of immunosuppressive therapy 05/27/2020  . FUO (fever of unknown origin) 05/15/2020  . Immunocompromised state ([place])   . Decubitus ulcer of coccyx, unstageable ([place]) 05/01/2020  . Pericardial effusion 04/16/2020  . Chronic respiratory failure with hypoxia ([place]) 04/16/2020  . Proteinuria 04/16/2020  . Pressure injury of skin of buttock 04/08/2020  . COPD (chronic obstructive pulmonary disease) ([place]) 04/07/2020  . SVT (supraventricular tachycardia) ([place])   . Anemia of chronic disease   . Palliative care encounter   . Goals of care, counseling/discussion   . Essential hypertension 02/08/2020  . Glaucoma 02/08/2020  . Sepsis ([place]) 01/31/2020  . SOB (shortness of breath)   . Fracture of left superior pubic ramus (HCC)   . Pain   . Anxiety state   . Left displaced femoral neck fracture ([place]) 12/15/2019  . Status post total hip replacement, left   . Post-op pain   . Polycystic kidney   . Closed left hip fracture ([place]) 12/07/2019  . Left wrist fracture 12/07/2019  .  Medication management 09/22/2019  . Physical deconditioning 09/22/2019  . Acute respiratory failure with hypoxia ([place]) 04/19/2019  . Acute on chronic diastolic CHF (congestive heart failure) ([place]) 04/19/2019  . Bilateral closed proximal tibial fracture 12/21/2018  . Asthma, chronic, unspecified asthma severity, with acute exacerbation 10/03/2018  . Chronic diastolic CHF (congestive heart failure) ([place]) 10/01/2017  . Asthma, mild intermittent 07/15/2016  . GERD without esophagitis 07/15/2016  . Renal transplant recipient 07/15/2016  . Hyperlipidemia 07/15/2016  . Depression 07/15/2016  . Bronchitis, mucopurulent recurrent ([place]) 08/08/2014  . Chronic cough 07/24/2014  . End stage renal disease ([place]) 03/23/2012  . 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, implant, and graft 03/23/2012   Past Medical History:  Past Medical History:  Diagnosis Date  . Arthritis   . Asthma   . CHF (congestive heart failure) ([place])   . COPD (chronic obstructive pulmonary disease) ([place])   . Depression   . Essential hypertension 02/08/2020  . GERD (gastroesophageal reflux disease)   . Hyperlipidemia   . Hyperparathyroidism   . Polycystic kidney   . PONV (postoperative nausea and vomiting)    Past Surgical History:  Past Surgical History:  Procedure Laterality Date  . 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 BILATERAL OOPHORECTOMY    . BIOPSY  05/19/2020   Procedure: BIOPSY;  Surgeon: Carol Ada, MD;  Location: [place];  Service: Endoscopy;;  . CARDIOVERSION N/A 12/25/2020   Procedure: CARDIOVERSION;  Surgeon: Geralynn Rile, MD;  Location: [place];  Service: Cardiovascular;  Laterality: N/A;  . CARPAL TUNNEL RELEASE  2000  .  CESAREAN SECTION    . COLONOSCOPY WITH PROPOFOL N/A 05/19/2020   Procedure: COLONOSCOPY WITH PROPOFOL;  Surgeon: Carol Ada, MD;  Location: [place];  Service: Endoscopy;  Laterality: N/A;  . ENTEROSCOPY N/A 05/19/2020    Procedure: ENTEROSCOPY;  Surgeon: Carol Ada, MD;  Location: [place];  Service: Endoscopy;  Laterality: N/A;  . INSERTION OF DIALYSIS CATHETER  03/31/2012   Procedure: INSERTION OF DIALYSIS CATHETER;  Surgeon: Angelia Mould, MD;  Location: [place];  Service: Vascular;  Laterality: N/A;  insertion of dialysis catheter right internal jugular vein  . 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 LAPAROTOMY N/A 12/29/2020   Procedure: EXPLORATORY LAPAROTOMY, SIGMOID COLECTOMY AND COLOSTOMY;  Surgeon: Coralie Keens, MD;  Location: [facility];  Service: General;  Laterality: N/A;  Packed Dressing  . ORIF TIBIA FRACTURE Left 12/23/2018   Procedure: OPEN REDUCTION INTERNAL FIXATION (ORIF) TIBIA FRACTURE;  Surgeon: Shona Needles, MD;  Location: [place];  Service: Orthopedics;  Laterality: Left;  . ORIF TIBIA PLATEAU Right 12/23/2018   Procedure: OPEN REDUCTION INTERNAL FIXATION (ORIF) TIBIAL PLATEAU;  Surgeon: Shona Needles, MD;  Location: [place];  Service: Orthopedics;  Laterality: Right;  . ORIF WRIST FRACTURE Left 12/08/2019   Procedure: OPEN REDUCTION INTERNAL FIXATION (ORIF) WRIST FRACTURE, REPAIR LACERATION LEFT FOREARM;  Surgeon: Dorna Leitz, MD;  Location: [facility];  Service: Orthopedics;  Laterality: Left;  . TEE WITHOUT CARDIOVERSION N/A 12/25/2020   Procedure: TRANSESOPHAGEAL ECHOCARDIOGRAM (TEE);  Surgeon: Geralynn Rile, MD;  Location: [place];  Service: Cardiovascular;  Laterality: N/A;  . [place]  . TOTAL HIP ARTHROPLASTY Left 12/08/2019   Procedure: TOTAL HIP ARTHROPLASTY ANTERIOR APPROACH;  Surgeon: Dorna Leitz, MD;  Location[facility];  Service: Orthopedics;  Laterality: Left;  . TUBAL LIGATION  2010   HPI:  April [name] is a 65 y.o. female with a history of atrial flutter s/p DCCV, chronic respiratory failure on 2 lpm of oxygen, asthma, possible OSA, CKD stage IIIa, ESRD s/p renal transplant, diastolic heart failure, diabetes, Raynaud's phenomenon. Patient  presented secondary to acute encephalopathy with associated hypotension in setting of intraabdominal fluid collection and recent intraabdominal surgery.  SLP noted pt underwent MBS 01/2020 at [facility] when admitted with CHF - MBS was unremarkable and odynophagia deemed to be pt's issue.  Pt does admit to "choking" sometimes on liquids - has h/o GERD.  Pt was most recently seen for a BSE on 01/01/21 with recommendation for Dysphagia 3 (soft) solids and thin liquids. SLE requested by patient's daughter due to concerns primarily with her weak voice.   Assessment / Plan / Recommendation Clinical Impression  Patient presents with a decreased vocal intensity which appears to be largely due to generalized weakness and lethargy. Patient in bed agreeable to talking with SLP but required frequent cues for alertness throughout session. She was oriented to time and place. SLP did not observe any vocal harshness or hoarseness. Patient reported she is still having nausea with most recent time being this morning. She also reports that she has not eaten much at all and has only had a little bit of fluids. Patient's daughter [name] called her cell phone during evaluation and patient unable to pick up phone which was on bedside table within reach of her. She did hold phone up to ear after SLP answered call and handed it to her. Daugher expressed that she felt patient wasn't "physically" able to carry on a conversation. SLP voiced agreement with this  as he suspects patient's difficulty with voice/communication secondary to lethargy and inability to maintain adequate alertness. Oxygen saturations and RR both WFL. SLP plans to f/u with patient at a later date to more fully assess cognition, language, speech and voice.    SLP Assessment  SLP Recommendation/Assessment: Patient needs continued Speech Lanaguage Pathology Services SLP Visit Diagnosis: Aphonia (R49.1);Cognitive communication deficit (R41.841)    Follow Up  Recommendations  None    Frequency and Duration min 1 x/week  1 week      SLP Evaluation Cognition  Overall Cognitive Status: Difficult to assess Arousal/Alertness: Lethargic Orientation Level: Oriented X4 Attention: Focused Focused Attention: Impaired Focused Attention Impairment: Verbal basic;Functional basic Comments: Patient not alert enough for assessment of cognitive functioning       Comprehension  Auditory Comprehension Overall Auditory Comprehension: Other (comment) (suspect WFL but unable to adequately assess due to patient's lethargy)    Expression Expression Primary Mode of Expression: Verbal Verbal Expression Overall Verbal Expression: Other (comment) (suspect WFL but patient not alert enough and very lethargic, preventing full evaluation.)   Oral / Motor  Oral Motor/Sensory Function Overall Oral Motor/Sensory Function: Within functional limits Motor Speech Overall Motor Speech: Other (comment) (seems to be genearlized weakness but will recommend further assessment when patient more alert) Respiration: Within functional limits Phonation: Low vocal intensity Articulation: Within functional limitis Intelligibility: Intelligible Motor Planning: Witnin functional limits Motor Speech Errors: Not applicable Interfering Components:  (lethargy)   GO                    Sonia Baller, MA, CCC-SLP Speech Therapy

## 2021-01-28 DIAGNOSIS — N179 Acute kidney failure, unspecified: Secondary | ICD-10-CM | POA: Diagnosis not present

## 2021-01-28 DIAGNOSIS — I959 Hypotension, unspecified: Secondary | ICD-10-CM | POA: Diagnosis not present

## 2021-01-28 DIAGNOSIS — Z66 Do not resuscitate: Secondary | ICD-10-CM

## 2021-01-28 DIAGNOSIS — N1832 Chronic kidney disease, stage 3b: Secondary | ICD-10-CM

## 2021-01-28 DIAGNOSIS — K651 Peritoneal abscess: Secondary | ICD-10-CM

## 2021-01-28 DIAGNOSIS — A498 Other bacterial infections of unspecified site: Secondary | ICD-10-CM | POA: Diagnosis not present

## 2021-01-28 DIAGNOSIS — I5033 Acute on chronic diastolic (congestive) heart failure: Secondary | ICD-10-CM | POA: Diagnosis not present

## 2021-01-28 DIAGNOSIS — G9341 Metabolic encephalopathy: Secondary | ICD-10-CM | POA: Diagnosis not present

## 2021-01-28 LAB — CBC
HCT: 29.1 % — ABNORMAL LOW (ref 36.0–46.0)
Hemoglobin: 8.8 g/dL — ABNORMAL LOW (ref 12.0–15.0)
MCH: 31.8 pg (ref 26.0–34.0)
MCHC: 30.2 g/dL (ref 30.0–36.0)
MCV: 105.1 fL — ABNORMAL HIGH (ref 80.0–100.0)
Platelets: 157 10*3/uL (ref 150–400)
RBC: 2.77 MIL/uL — ABNORMAL LOW (ref 3.87–5.11)
RDW: 21.3 % — ABNORMAL HIGH (ref 11.5–15.5)
WBC: 6.3 10*3/uL (ref 4.0–10.5)
nRBC: 0 % (ref 0.0–0.2)

## 2021-01-28 LAB — BASIC METABOLIC PANEL
Anion gap: 4 — ABNORMAL LOW (ref 5–15)
BUN: 56 mg/dL — ABNORMAL HIGH (ref 8–23)
CO2: 23 mmol/L (ref 22–32)
Calcium: 9.2 mg/dL (ref 8.9–10.3)
Chloride: 109 mmol/L (ref 98–111)
Creatinine, Ser: 1.87 mg/dL — ABNORMAL HIGH (ref 0.44–1.00)
GFR, Estimated: 30 mL/min — ABNORMAL LOW (ref >60)
Glucose, Bld: 92 mg/dL (ref 70–99)
Potassium: 3.7 mmol/L (ref 3.5–5.1)
Sodium: 136 mmol/L (ref 135–145)

## 2021-01-28 LAB — GLUCOSE, CAPILLARY
Glucose-Capillary: 83 mg/dL (ref 70–99)
Glucose-Capillary: 103 mg/dL — ABNORMAL HIGH (ref 70–99)
Glucose-Capillary: 137 mg/dL — ABNORMAL HIGH (ref 70–99)
Glucose-Capillary: 110 mg/dL — ABNORMAL HIGH (ref 70–99)

## 2021-01-28 LAB — HEPARIN LEVEL (UNFRACTIONATED): Heparin Unfractionated: 0.44 IU/mL (ref 0.30–0.70)

## 2021-01-28 MED ORDER — FENTANYL CITRATE (PF) 100 MCG/2ML IJ SOLN
25.0000 ug | Freq: Once | INTRAMUSCULAR | Status: AC
  Administered 2021-01-28: 25 ug via INTRAVENOUS
  Filled 2021-01-28: qty 2

## 2021-01-28 MED ORDER — HYDROCORTISONE (PERIANAL) 2.5 % EX CREA
TOPICAL_CREAM | Freq: Two times a day (BID) | CUTANEOUS | Status: DC
  Filled 2021-01-28 (×2): qty 28.35

## 2021-01-28 NOTE — TOC Progression Note (Signed)
Transition of Care [facility]) - Progression Note    Patient Details  Name: April [name] MRN: [id number] Date of Birth: Nov 01, 1956  Transition of [facility]) CM/SW Contact  Leeroy Cha, RN Phone Number: 01/28/2021, 1:15 PM  Clinical Narrative:    Authorization for snf placment started through [facility],. Fax ed information at [phone].   Expected Discharge Plan: Skilled Nursing Facility Barriers to Discharge: Continued Medical Work up  Expected Discharge Plan and Services Expected Discharge Plan: [place]   Discharge Planning Services: CM Consult Post Acute Care Choice: [place] Living arrangements for the past 2 months: Single Family Home                                       Social Determinants of Health (SDOH) Interventions    Readmission Risk Interventions Readmission Risk Prevention Plan 05/15/2020 04/19/2020 02/09/2020  Transportation Screening Complete Complete Complete  PCP or Specialist Appt within 3-5 Days - - -  Not Complete comments - - -  HRI or [place] or Home Care Consult comments - - -  Social Work Consult for [place] Planning/Counseling - - -  SW consult not completed comments - - -  Palliative Care Screening - - -  Medication Review ([place]) Referral to Pharmacy Complete Referral to Pharmacy  PCP or Specialist appointment within 3-5 days of discharge Complete Complete Complete  HRI or Home Care Consult Complete Complete Complete  SW Recovery Care/Counseling Consult Complete Complete -  Palliative Care Screening Not Applicable Not Applicable Complete  Skilled Nursing Facility Complete Complete Patient Refused  Some recent data might be hidden

## 2021-01-28 NOTE — Progress Notes (Signed)
Pt refused po vancomycin. Pt reported "I don't think I want to take any more medicines." RN clarified with pt that she is tired of receiving treatments and is considering stopping all medical care. Pt agreed to take IV meds through the night and discuss with medical team in the morning.

## 2021-01-28 NOTE — Progress Notes (Signed)
Secretary placed request for mepitel wound care supply. Materials reported it is not in stock and will be delivered tomorrow. RN staff to f/u and obtain mepitel Kellie Simmering # [phone]).

## 2021-01-28 NOTE — Evaluation (Signed)
Occupational Therapy Evaluation Patient Details Name: April [name] MRN: [id number] DOB: [birth year] Today's Date: 01/28/2021    History of Present Illness April [name] is a 65 y.o. female admitted on 01/16/2021 secondary to acute encephalopathy with associated hypotension in setting of intraabdominal fluid collection and recent intraabdominal surgery. General surgery and IR consulted for management.  Found to have C. Difficile.  Noted pt's with open abdominal wound. Recently in [facility] for new onset afib from 12/16/20-01/14/21 , DC to SNF. H/O Left hip and wrist fx 11/2019.  Pt with several recent admissions - review PT notes and PATIENT WAS AMBULATORY prior to 01/02/21.  PMH of atrial flutter s/p DCCV, chronic respiratory failure on 2 lpm of oxygen, asthma, possible OSA, CKD stage IIIa, ESRD s/p renal transplant, diastolic heart failure, diabetes, Raynaud's phenomenon.   Clinical Impression   Pt today is willing to work with therapy and very pleasant. Pt was able to engage in bed level exercises prior to max A +2 to come sit EOB (utilized sidelying and then coming up) where she engaged in LB exercises and functional grooming tasks, maintaining seated balance at min A for over 5 min prior to total A +2 to return supine. Pt will benefit from skilled OT in the acute setting as well as afterwards at the SNF level to maximize safety and independence in ADL and functional transfers.    Follow Up Recommendations  SNF    Equipment Recommendations  Other (comment) (defer to next venue of care)    Recommendations for Other Services       Precautions / Restrictions Precautions Precautions: Fall Precaution Comments: colostomy, open abd wound Restrictions Weight Bearing Restrictions: No      Mobility Bed Mobility Overal bed mobility: Needs Assistance Bed Mobility: Rolling;Sidelying to Sit;Sit to Supine Rolling: Mod assist;+2 for physical assistance Sidelying to sit: Max assist;+2 for physical  assistance   Sit to supine: Total assist;+2 for physical assistance   General bed mobility comments: Rolling: assist to bed opposite leg and reach across with bed pad to facilitate; Sidelying to Sit: log roll technique due to abd wound, max A for legs off bed and to lift trunk with bed pad to facilitate; Sit to Supine: Total x 2 helicopter technique with bed pad    Transfers                 General transfer comment: will require mechanical lift    Balance Overall balance assessment: Needs assistance Sitting-balance support: Feet supported;Bilateral upper extremity supported Sitting balance-Leahy Scale: Poor Sitting balance - Comments: Sat EOB for at least 15 mins with min guard - min A.  Performed ADLs with OT and LE exercises.       Standing balance comment: unable                           ADL either performed or assessed with clinical judgement   ADL Overall ADL's : Needs assistance/impaired Eating/Feeding: Set up;Bed level   Grooming: Oral care;Wash/dry face;Set up;Sitting Grooming Details (indicate cue type and reason): supported sitting Upper Body Bathing: Moderate assistance;Bed level   Lower Body Bathing: Total assistance;Bed level   Upper Body Dressing : Maximal assistance   Lower Body Dressing: Total assistance;Bed level   Toilet Transfer: Total assistance Toilet Transfer Details (indicate cue type and reason): mechanical lift Toileting- Clothing Manipulation and Hygiene: Total assistance       Functional mobility during ADLs:  (NTthis session) General  ADL Comments: decreased activity tolerance, but willing to try and enjoys activity participation - likes to read normally     Vision Patient Visual Report: No change from baseline       Perception     Praxis      Pertinent Vitals/Pain Pain Assessment: Faces Faces Pain Scale: Hurts even more Pain Location: buttocks Pain Descriptors / Indicators: Grimacing;Discomfort (with initial  heel slides and transfers) Pain Intervention(s): Monitored during session;Repositioned     Hand Dominance Right   Extremity/Trunk Assessment Upper Extremity Assessment Upper Extremity Assessment: Generalized weakness (BUE discoloration)   Lower Extremity Assessment Lower Extremity Assessment: Defer to PT evaluation       Communication Communication Communication: No difficulties   Cognition Arousal/Alertness: Awake/alert Behavior During Therapy: Flat affect Overall Cognitive Status: Within Functional Limits for tasks assessed                                 General Comments: Pt with flat affect but did follow all commands and carried on appropriate conversations   General Comments       Exercises General Exercises - Lower Extremity Ankle Circles/Pumps: AROM;20 reps;Both;Supine Quad Sets: AROM;Both;10 reps;Supine Long Arc Quad: AAROM;AROM;10 reps;Seated (started with AAROM progressed to AROM) Heel Slides: AAROM;Both;10 reps;Supine (gradually increased ROM to tolerance; c/o pain in buttock)   Shoulder Instructions      Home Living Family/patient expects to be discharged to:: Skilled nursing facility Living Arrangements: Spouse/significant other Available Help at Discharge: Family;Available 24 hours/day;Friend(s) Type of Home: House Home Access: Ramped entrance     Home Layout: Two level;Able to live on main level with bedroom/bathroom Alternate Level Stairs-Number of Steps: 12   Bathroom Shower/Tub: Teacher, early years/pre: Standard     Home Equipment: Environmental consultant - 2 wheels;Wheelchair - [employer];Shower seat;Adaptive equipment;Hospital bed;Tub bench;Hand held shower head;Transport chair Adaptive Equipment: Reacher;Sock aid;Long-handled shoe horn Additional Comments: At home she lives with family, daughter is an OT and lives nearby, has lots of equipment  Lives With: Spouse    Prior Functioning/Environment Level of Independence:  Needs assistance  Gait / Transfers Assistance Needed: recently in SNF, Mechanical lift  for OOB last admission ADL's / Homemaking Assistance Needed: Able to do UB dressing, assist needed with LB dressing, or often just wears gowns. Reports difficulty with shoes due to frequent swelling.   Comments: Very pleasant        OT Problem List: Decreased strength;Decreased range of motion;Decreased activity tolerance;Impaired balance (sitting and/or standing);Decreased knowledge of use of DME or AE;Decreased knowledge of precautions;Pain;Increased edema;Obesity      OT Treatment/Interventions: Self-care/ADL training;Therapeutic exercise;DME and/or AE instruction;Therapeutic activities;Patient/family education;Balance training    OT Goals(Current goals can be found in the care plan section) Acute Rehab OT Goals Patient Stated Goal: To go home OT Goal Formulation: With patient Time For Goal Achievement: 02/11/21 Potential to Achieve Goals: Fair ADL Goals Pt Will Perform Grooming: with modified independence;bed level Pt Will Perform Upper Body Dressing: with min guard assist;sitting Pt/caregiver will Perform Home Exercise Program: Both right and left upper extremity;Independently;With written HEP provided Additional ADL Goal #1: Pt will perform bed mobility at mod A prior to engaging in seated ADL tasks  OT Frequency: Min 2X/week   Barriers to D/C:            Co-evaluation   Reason for Co-Treatment: Complexity of the patient's impairments (multi-system involvement);For patient/therapist safety;To address functional/ADL transfers;Other (comment) (activity  tolerance) PT goals addressed during session: Mobility/safety with mobility;Balance;Strengthening/ROM OT goals addressed during session: ADL's and self-care;Strengthening/ROM      AM-PAC OT "6 Clicks" Daily Activity     Outcome Measure Help from another person eating meals?: A Little Help from another person taking care of personal  grooming?: A Little Help from another person toileting, which includes using toliet, bedpan, or urinal?: Total Help from another person bathing (including washing, rinsing, drying)?: A Lot Help from another person to put on and taking off regular upper body clothing?: A Lot Help from another person to put on and taking off regular lower body clothing?: Total 6 Click Score: 12   End of Session Equipment Utilized During Treatment: Oxygen Nurse Communication: Mobility status  Activity Tolerance: Patient limited by pain;Patient limited by fatigue Patient left: in bed;with call bell/phone within reach;with bed alarm set;with nursing/sitter in room (prevlon boots re-applied)  OT Visit Diagnosis: Other abnormalities of gait and mobility (R26.89);Muscle weakness (generalized) (M62.81);History of falling (Z91.81);Pain Pain - part of body:  (abdomen)                Time: 1194-1740 OT Time Calculation (min): 31 min Charges:  OT General Charges $OT Visit: 1 Visit OT Evaluation $OT Eval Moderate Complexity: [place] OTR/L Acute Rehabilitation Services Pager: [phone] Office: [place] 01/28/2021, 2:26 PM

## 2021-01-28 NOTE — NC FL2 (Signed)
[place] LEVEL OF CARE SCREENING TOOL     IDENTIFICATION  Patient Name: April [name] Birthdate: 1956/04/10 Sex: female Admission Date (Current Location): 12/27/2020  [facility] and [place] Number:  Herbalist and Address:  [facility],  [place], [place]      Provider Number: [id number]  Attending Physician Name and Address:  Mariel Aloe, MD  Relative Name and Phone Number:  [facility] Daughter [phone]  [phone]    Current Level of Care: Hospital Recommended Level of Care: [place] Prior Approval Number:    Date Approved/Denied:   PASRR Number: [id number]  Discharge Plan: SNF    Current Diagnoses: Patient Active Problem List   Diagnosis Date Noted  . Hypotension 01/07/2021  . Acute metabolic encephalopathy 87/86/7672  . Difficult intravenous access   . Bilious vomiting with nausea   . Sigmoid diverticulitis 12/16/2020  . Acute CHF (congestive heart failure) ([place]) 12/16/2020  . Fever of unknown origin 10/24/2020  . Fever of unknown origin (FUO) 10/23/2020  . Lower GI bleeding 09/25/2020  . Diarrhea 09/10/2020  . Acute renal failure superimposed on stage 3b chronic kidney disease ([place]) 09/08/2020  . Acute diverticulitis 09/04/2020  . SIRS (systemic inflammatory response syndrome) ([place]) 07/02/2020  . Chronic kidney disease, stage 3a ([place])   . Kappa light chain disease ([place]) 06/19/2020  . History of immunosuppressive therapy 05/27/2020  . FUO (fever of unknown origin) 05/15/2020  . Immunocompromised state ([place])   . Decubitus ulcer of coccyx, unstageable ([place]) 05/01/2020  . Pericardial effusion 04/16/2020  . Chronic respiratory failure with hypoxia ([place]) 04/16/2020  . Proteinuria 04/16/2020  . Pressure injury of skin of buttock 04/08/2020  . COPD (chronic obstructive pulmonary disease) ([place]) 04/07/2020  . SVT (supraventricular tachycardia) ([place])   . Anemia of chronic disease    . Palliative care encounter   . Goals of care, counseling/discussion   . Essential hypertension 02/08/2020  . Glaucoma 02/08/2020  . Sepsis ([place]) 01/31/2020  . SOB (shortness of breath)   . Fracture of left superior pubic ramus (HCC)   . Pain   . Anxiety state   . Left displaced femoral neck fracture ([place]) 12/15/2019  . Status post total hip replacement, left   . Post-op pain   . Polycystic kidney   . Closed left hip fracture ([place]) 12/07/2019  . Left wrist fracture 12/07/2019  . Medication management 09/22/2019  . Physical deconditioning 09/22/2019  . Acute respiratory failure with hypoxia ([place]) 04/19/2019  . Acute on chronic diastolic CHF (congestive heart failure) ([place]) 04/19/2019  . Bilateral closed proximal tibial fracture 12/21/2018  . Asthma, chronic, unspecified asthma severity, with acute exacerbation 10/03/2018  . Chronic diastolic CHF (congestive heart failure) ([place]) 10/01/2017  . Asthma, mild intermittent 07/15/2016  . GERD without esophagitis 07/15/2016  . Renal transplant recipient 07/15/2016  . Hyperlipidemia 07/15/2016  . Depression 07/15/2016  . Bronchitis, mucopurulent recurrent ([place]) 08/08/2014  . Chronic cough 07/24/2014  . End stage renal disease ([place]) 03/23/2012  . 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, implant, and graft 03/23/2012    Orientation RESPIRATION BLADDER Height & Weight     Self,Time,Situation,Place  O2 Incontinent Weight: 73.3 kg Height:  5\' 1"  (154.9 cm)  BEHAVIORAL SYMPTOMS/MOOD NEUROLOGICAL BOWEL NUTRITION STATUS      Colostomy Diet (regular)  AMBULATORY STATUS COMMUNICATION OF NEEDS Skin   Extensive Assist Verbally PU Stage and Appropriate Care  Personal Care Assistance Level of Assistance  Bathing,Dressing,Feeding Bathing Assistance: Limited assistance Feeding assistance: Limited assistance Dressing Assistance: Limited assistance     Functional Limitations Info  Sight,Hearing,Speech  Sight Info: Adequate Hearing Info: Adequate Speech Info: Adequate    SPECIAL CARE FACTORS FREQUENCY  PT (By licensed PT),OT (By licensed OT)     PT Frequency: 5 x weekly OT Frequency: 5 x weekly            Contractures Contractures Info: Not present    Additional Factors Info  Code Status,Allergies,Insulin Sliding Scale,Isolation Precautions Code Status Info: full code Allergies Info: Infed, Pentamidine Budesonide-formoterol Fumarate Bupropion Crestor, Duloxetine Hcl Erythromycin, Iohexol Oxycodone Pravastatin Sodium Erythromycin Ultram           Current Medications (01/28/2021):  This is the current hospital active medication list Current Facility-Administered Medications  Medication Dose Route Frequency Provider Last Rate Last Admin  . (feeding supplement) PROSource Plus liquid 30 mL  30 mL Oral BID BM Dessa Phi, DO   30 mL at 01/28/21 1120  . 0.9 %  sodium chloride infusion   Intravenous PRN Mariel Aloe, MD 10 mL/hr at 01/28/21 0800 Infusion Verify at 01/28/21 0800  . 0.9 %  sodium chloride infusion   Intravenous PRN Mariel Aloe, MD 10 mL/hr at 01/28/21 0835 500 mL at 01/28/21 0835  . acetaminophen (TYLENOL) tablet 650 mg  650 mg Oral Q6H PRN Dessa Phi, DO   650 mg at 01/24/21 2158   Or  . acetaminophen (TYLENOL) suppository 650 mg  650 mg Rectal Q6H PRN Dessa Phi, DO      . allopurinol (ZYLOPRIM) tablet 100 mg  100 mg Oral Daily Dessa Phi, DO   100 mg at 01/28/21 1118  . alum & mag hydroxide-simeth (MAALOX/MYLANTA) 200-200-20 MG/5ML suspension 15 mL  15 mL Oral Q4H PRN Mariel Aloe, MD   15 mL at 01/26/21 1306  . brimonidine (ALPHAGAN) 0.15 % ophthalmic solution 1 drop  1 drop Both Eyes BID Dessa Phi, DO   1 drop at 01/28/21 1121  . ceFEPIme (MAXIPIME) 2 g in sodium chloride 0.9 % 100 mL IVPB  2 g Intravenous Q24H Adrian Saran, [facility]   Stopped at 01/27/21 1816  . chlorhexidine (PERIDEX) 0.12 % solution 15 mL  15 mL Mouth Rinse BID Mariel Aloe, MD   15 mL at 01/28/21 1118  . Chlorhexidine Gluconate Cloth 2 % PADS 6 each  6 each Topical Daily Dessa Phi, DO   6 each at 01/28/21 1121  . cycloSPORINE (RESTASIS) 0.05 % ophthalmic emulsion 1 drop  1 drop Both Eyes BID Dessa Phi, DO   1 drop at 01/28/21 1120  . dorzolamide (TRUSOPT) 2 % ophthalmic solution 1 drop  1 drop Both Eyes Daily Dessa Phi, DO   1 drop at 01/28/21 1122  . feeding supplement (ENSURE ENLIVE / ENSURE PLUS) liquid 237 mL  237 mL Oral BID BM Dessa Phi, DO   237 mL at 01/26/21 1604  . FLUoxetine (PROZAC) capsule 40 mg  40 mg Oral Daily Dessa Phi, DO   40 mg at 01/28/21 1118  . furosemide (LASIX) injection 80 mg  80 mg Intravenous BID Mariel Aloe, MD   80 mg at 01/28/21 0836  . heparin ADULT infusion 100 units/mL (25000 units/236mL)  900 Units/hr Intravenous Continuous Thomes Lolling, RPH 9 mL/hr at 01/28/21 0800 900 Units/hr at 01/28/21 0800  . insulin aspart (novoLOG) injection 0-5 Units  0-5 Units Subcutaneous QHS  Dessa Phi, DO   3 Units at 01/25/21 2308  . insulin aspart (novoLOG) injection 0-9 Units  0-9 Units Subcutaneous TID WC Dessa Phi, DO   1 Units at 01/27/21 1741  . insulin glargine (LANTUS) injection 5 Units  5 Units Subcutaneous BID Dessa Phi, DO   5 Units at 01/28/21 1120  . latanoprost (XALATAN) 0.005 % ophthalmic solution 1 drop  1 drop Both Eyes QHS Dessa Phi, DO   1 drop at 01/27/21 2136  . MEDLINE mouth rinse  15 mL Mouth Rinse q12n4p Mariel Aloe, MD   15 mL at 01/28/21 1128  . metoprolol tartrate (LOPRESSOR) tablet 25 mg  25 mg Oral BID Mariel Aloe, MD   25 mg at 01/28/21 1118  . metroNIDAZOLE (FLAGYL) IVPB 500 mg  500 mg Intravenous Q8H Dessa Phi, DO   Stopped at 01/28/21 0935  . multivitamin with minerals tablet 1 tablet  1 tablet Oral Daily Dessa Phi, DO   1 tablet at 01/28/21 1118  . mycophenolate (MYFORTIC) EC tablet 180 mg  180 mg Oral BID Dessa Phi, DO   180 mg at  01/28/21 1123  . nutrition supplement (JUVEN) (JUVEN) powder packet 1 packet  1 packet Oral BID BM Dessa Phi, DO   1 packet at 01/28/21 [phone]  . ondansetron (ZOFRAN) tablet 4 mg  4 mg Oral Q6H PRN Dessa Phi, DO       Or  . ondansetron [facility]) injection 4 mg  4 mg Intravenous Q6H PRN Dessa Phi, DO   4 mg at 01/27/21 0921  . predniSONE (DELTASONE) tablet 7.5 mg  7.5 mg Oral Q breakfast Mariel Aloe, MD   7.5 mg at 01/28/21 0836  . promethazine (PHENERGAN) injection 12.5 mg  12.5 mg Intravenous Q6H PRN Mariel Aloe, MD   12.5 mg at 01/27/21 2137  . sodium chloride flush (NS) 0.9 % injection 3 mL  3 mL Intravenous Q12H Dessa Phi, DO   3 mL at 01/28/21 1123  . sodium chloride flush (NS) 0.9 % injection 5 mL  5 mL Intracatheter Q8H Mir, Paula Libra, MD   5 mL at 01/28/21 0650  . tacrolimus (PROGRAF) capsule 1 mg  1 mg Oral BID Dessa Phi, DO   1 mg at 01/28/21 1123  . vancomycin (VANCOCIN) 50 mg/mL oral solution 125 mg  125 mg Oral QID Opyd, Ilene Qua, MD   125 mg at 01/28/21 1120     Discharge Medications: Please see discharge summary for a list of discharge medications.  Relevant Imaging Results:  Relevant Lab Results:   Additional Information SSN [id number]  Leeroy Cha, RN

## 2021-01-28 NOTE — Progress Notes (Signed)
PROGRESS NOTE    April [name]  HQI:[id number] DOB: [birth year] DOA: 12/26/2020 PCP: [name], MD   Brief Narrative: April [name] is a 65 y.o. female with a history of atrial flutter s/p DCCV, chronic respiratory failure on 2 lpm of oxygen, asthma, possible OSA, CKD stage IIIa, ESRD s/p renal transplant, diastolic heart failure, diabetes, Raynaud's phenomenon. Patient presented secondary to acute encephalopathy with associated hypotension in setting of intraabdominal fluid collection and recent intraabdominal surgery. General surgery and IR consulted for management. Patient managed empirically on antibiotics. Found to have C. Difficile and started on Vancomycin PO.   Assessment & Plan:   Principal Problem:   Acute metabolic encephalopathy Active Problems:   Renal transplant recipient   Depression   Acute respiratory failure with hypoxia (HCC)   Acute on chronic diastolic CHF (congestive heart failure) (HCC)   Acute renal failure superimposed on stage 3b chronic kidney disease (HCC)   Hypotension   Acute metabolic encephalopathy Unsure of etiology but resolved.  Abdominal fluid collection 3.2 x 2.8 x 5.3 cm abscess noted on CT scan. In setting of recent surgery. General surgery consulted who recommends IR drainage. Aspiration and drain placement performed by IR on 3/4. Culture data is significant for multiple organisms on gram stain with no predominant organism on actual culture. -Continue Cefepime/Flagyl  Acute on chronic respiratory failure with hypoxia Unknown etiology but back to baseline 2 Lpm of oxygen. -Continue oxygen @ 2 lpm  AKI on CKD stage IIIa History of ESRD s/p renal transplant. Currently on mycophenolate, racrolimus. Left arm fistula. Baseline creatinine of 1.1-1.5 per chart review. Creatinine of 2.19 on admission in setting of hypotension in addition to heart failure. Improving with diuresis. -Lasix as mentioned below -Daily BMP  Acute on  chronic diastolic heart failure Anasarca BNP significantly lower from baseline. Restarted lasix secondary to worsening AKI with significant edema. -Daily BMP -Continue Lasix 80 mg IV BID; transition back to home lasix once maximally diuresed -Daily weights and strict in/out  C. Difficile infection Mild symptoms. Positive antigen and toxin. Started on Vancomycin PO -Continue Vancomycin PO x 10 days  Acute on chronic anemia Normocytic anemia Iron deficiency anemia Acute anemia in setting of recent abdominal surgery. Currently stable. No current evidence of bleed -Daily CBC  Hypotension Patient received two 500 mL NS boluses. Does not appear to have had shock. Did not require vasopressor support. Resolved.  Acute urinary retention Unsure of etiology. Foley placed on admission -Voiding trial prior to discharge; will await until IV diuresis complete  Adrenal insufficiency Patient is on prednisone 7.5 mg daily as an outpatient. Started on stress dose steroids secondary   Typical atrial flutter Do not see mention of atrial fibrillation from last admission. Patient underwent Transesophageal Echocardiogram/cardioversion on 12/29/2020. Converted to sinus rhythm and started on Eliquis. Heparin IV started on admission. Metoprolol not restarted on admission and now with rapid rates -Continue heparin IV and transition back to Eliquis pending management of above -Continue metoprolol 25 mg BID  Diabetes mellitus, type 2 Patient is on Lantus as an outpaitnet  Raynaud's On amlodipine as an outpatient which was held secondary to hypotension.  Possible OSA -Continue CPAP qhs  Hyponatremia Mild. Patient hypovolemic on admission which may be etiology. Given NS boluses. Improving with lasix. -BMP daily  History of renal transplant -Continue home mycophenolate and tacrolimus  Primary hypertension Patient is on amlodipine, metoprolol and furosemide as an outpatient which were held secondary to  hypotension.  Depression Patient is on  Prozac as an outpatient -Continue Prozac  Hypokalemia Secondary to diuresis -Potassium supplementation as needed  Gout Patient is on colchicine and allopurinol as an outpatient -Continue allopurinol   DVT prophylaxis: Heparin IV Code Status:   Code Status: DNR Family Communication: None at bedside. Daughter on telephone (9 minutes) Disposition Plan: Discharge home vs SNF likely in several days pending general surgery/IR recommendations in addition to PT recommendations   Consultants:   General surgery  Interventional radiology  Procedures:   LLQ ABSCESS DRAIN (01/24/2021)  Antimicrobials:  Vancomycin IV  Vancomycin PO  Flagyl  Cefepime    Subjective: Not eating well. States she has nausea.   Objective: Vitals:   01/28/21 0500 01/28/21 0600 01/28/21 0800 01/28/21 1200  BP:  (!) 138/52 (!) 140/53   Pulse:  63 (!) 58   Resp:  16 13   Temp:   98.3 F (36.8 C) (!) 96.3 F (35.7 C)  TempSrc:   Oral Axillary  SpO2:  100% 100%   Weight: 73.3 kg     Height:        Intake/Output Summary (Last 24 hours) at 01/28/2021 1248 Last data filed at 01/28/2021 0800 Gross per 24 hour  Intake 822.05 ml  Output 2320 ml  Net -1497.95 ml   Filed Weights   01/22/21 0500 01/27/21 1645 01/28/21 0500  Weight: 73.3 kg 73.3 kg 73.3 kg    Examination:  General exam: Appears calm and comfortable Respiratory system: Clear to auscultation. Respiratory effort normal. Cardiovascular system: S1 & S2 heard, RRR. 2/6 murmur Gastrointestinal system: Abdomen is nondistended, soft and nontender. No organomegaly or masses felt. Normal bowel sounds heard. Central nervous system: Alert and oriented. No focal neurological deficits. Musculoskeletal: Upper/lower extremity edema. No calf tenderness Skin: No cyanosis. No rashes Psychiatry: Judgement and insight appear normal. Mood & affect appropriate.   Data Reviewed: I have personally reviewed  following labs and imaging studies  CBC Lab Results  Component Value Date   WBC 6.3 01/28/2021   RBC 2.77 (L) 01/28/2021   HGB 8.8 (L) 01/28/2021   HCT 29.1 (L) 01/28/2021   MCV 105.1 (H) 01/28/2021   MCH 31.8 01/28/2021   PLT 157 01/28/2021   MCHC 30.2 01/28/2021   RDW 21.3 (H) 01/28/2021   LYMPHSABS 0.4 (L) 01/22/2021   MONOABS 0.1 01/22/2021   EOSABS 0.0 01/22/2021   BASOSABS 0.0 86/76/7209     Last metabolic panel Lab Results  Component Value Date   NA 136 01/28/2021   K 3.7 01/28/2021   CL 109 01/28/2021   CO2 23 01/28/2021   BUN 56 (H) 01/28/2021   CREATININE 1.87 (H) 01/28/2021   GLUCOSE 92 01/28/2021   GFRNONAA 30 (L) 01/28/2021   GFRAA 51 (L) 08/05/2020   CALCIUM 9.2 01/28/2021   PHOS 4.1 01/21/2021   PROT 5.7 (L) 01/22/2021   ALBUMIN 2.1 (L) 01/22/2021   LABGLOB 3.8 11/04/2020   AGRATIO 0.9 05/31/2020   BILITOT 1.1 01/22/2021   ALKPHOS 80 01/22/2021   AST 20 01/22/2021   ALT 9 01/22/2021   ANIONGAP 4 (L) 01/28/2021    CBG (last 3)  Recent Labs    01/27/21 2130 01/28/21 0744 01/28/21 1124  GLUCAP 131* 83 103*     GFR: Estimated Creatinine Clearance: 28.2 mL/min (A) (by C-G formula based on SCr of 1.87 mg/dL (H)).  Coagulation Profile: Recent Labs  Lab 01/22/21 0258 01/23/21 0248 01/24/21 0252  INR 2.2* 1.6* 1.9*    Recent Results (from the past 240 hour(s))  Resp Panel by RT-PCR (Flu A&B, Covid) Nasopharyngeal Swab     Status: None   Collection Time: 01/04/2021  2:05 PM   Specimen: Nasopharyngeal Swab; Nasopharyngeal(NP) swabs in vial transport medium  Result Value Ref Range Status   SARS Coronavirus 2 by RT PCR NEGATIVE NEGATIVE Final    Comment: (NOTE) SARS-CoV-2 target nucleic acids are NOT DETECTED.  The SARS-CoV-2 RNA is generally detectable in upper respiratory specimens during the acute phase of infection. The lowest concentration of SARS-CoV-2 viral copies this assay can detect is 138 copies/mL. A negative result does not  preclude SARS-Cov-2 infection and should not be used as the sole basis for treatment or other patient management decisions. A negative result may occur with  improper specimen collection/handling, submission of specimen other than nasopharyngeal swab, presence of viral mutation(s) within the areas targeted by this assay, and inadequate number of viral copies(<138 copies/mL). A negative result must be combined with clinical observations, patient history, and epidemiological information. The expected result is Negative.  Fact Sheet for Patients:  EntrepreneurPulse.com.au  Fact Sheet for Healthcare Providers:  IncredibleEmployment.be  This test is no t yet approved or cleared by the [place] FDA and  has been authorized for detection and/or diagnosis of SARS-CoV-2 by FDA under an Emergency Use Authorization (EUA). This EUA will remain  in effect (meaning this test can be used) for the duration of the COVID-19 declaration under Section 564(b)(1) of the Act, 21 U.S.C.section 360bbb-3(b)(1), unless the authorization is terminated  or revoked sooner.       Influenza A by PCR NEGATIVE NEGATIVE Final   Influenza B by PCR NEGATIVE NEGATIVE Final    Comment: (NOTE) The Xpert Xpress SARS-CoV-2/FLU/RSV plus assay is intended as an aid in the diagnosis of influenza from Nasopharyngeal swab specimens and should not be used as a sole basis for treatment. Nasal washings and aspirates are unacceptable for Xpert Xpress SARS-CoV-2/FLU/RSV testing.  Fact Sheet for Patients: EntrepreneurPulse.com.au  Fact Sheet for Healthcare Providers: IncredibleEmployment.be  This test is not yet approved or cleared by the [place] FDA and has been authorized for detection and/or diagnosis of SARS-CoV-2 by FDA under an Emergency Use Authorization (EUA). This EUA will remain in effect (meaning this test can be used) for the  duration of the COVID-19 declaration under Section 564(b)(1) of the Act, 21 U.S.C. section 360bbb-3(b)(1), unless the authorization is terminated or revoked.  Performed at [facility], [place] [address] [place], [place] [address]   Culture, blood (Routine x 2)     Status: None   Collection Time: 01/18/2021  3:28 PM   Specimen: BLOOD  Result Value Ref Range Status   Specimen Description   Final    BLOOD RIGHT ARM Performed at [place] [address], [place], [place] [address]    Special Requests   Final    BOTTLES DRAWN AEROBIC AND ANAEROBIC Blood Culture adequate volume Performed at [place] [address] [place], [place] [address]    Culture   Final    NO GROWTH 5 DAYS Performed at [facility], [place] [address], [place], [place] [address]    Report Status 01/25/2021 FINAL  Final  MRSA PCR Screening     Status: None   Collection Time: 01/16/2021  6:20 PM   Specimen: Nasopharyngeal  Result Value Ref Range Status   MRSA by PCR NEGATIVE NEGATIVE Final    Comment:        The GeneXpert  MRSA Assay (FDA approved for NASAL specimens only), is one component of a comprehensive MRSA colonization surveillance program. It is not intended to diagnose MRSA infection nor to guide or monitor treatment for MRSA infections. Performed at [facility], [place] [address], [place], [place] [address]   Urine culture     Status: None   Collection Time: 01/18/2021  6:21 PM   Specimen: Urine, Random  Result Value Ref Range Status   Specimen Description   Final    URINE, RANDOM Performed at [place] [address], [place], [place] [address]    Special Requests   Final    NONE Performed at [facility], [place] [address], [place], [place] [address]    Culture   Final    NO GROWTH Performed at [facility], [place] [address] [place], [place] [address]    Report  Status 01/22/2021 FINAL  Final  C Difficile Quick Screen w PCR reflex     Status: Abnormal   Collection Time: 01/21/21  3:43 PM   Specimen: STOOL  Result Value Ref Range Status   C Diff antigen POSITIVE (A) NEGATIVE Final   C Diff toxin POSITIVE (A) NEGATIVE Final   C Diff interpretation Toxin producing C. difficile detected.  Final    Comment: CRITICAL RESULT CALLED TO, READ BACK BY AND VERIFIED WITH: P.MERCADO AT 2017 ON 03.01.22 BY N.THOMPSON Performed at [facility], [place] [address], [place], [place] [address]   Aerobic/Anaerobic Culture (surgical/deep wound)     Status: Abnormal (Preliminary result)   Collection Time: 01/24/21  1:08 PM   Specimen: Abscess  Result Value Ref Range Status   Specimen Description   Final    ABSCESS Performed at [place] [address], [place], [place] [address]    Special Requests   Final    NONE Performed at [facility], [place] [address] [place], [place] [address]    Gram Stain   Final    MODERATE WBC PRESENT, PREDOMINANTLY PMN MODERATE GRAM POSITIVE COCCI IN PAIRS IN CLUSTERS FEW GRAM POSITIVE RODS RARE GRAM NEGATIVE RODS Performed at [facility], [place] [address], [place],  [address]    Culture (A)  Final    MULTIPLE ORGANISMS PRESENT, NONE PREDOMINANT NO ANAEROBES ISOLATED; CULTURE IN PROGRESS FOR 5 DAYS    Report Status PENDING  Incomplete        Radiology Studies: No results found.      Scheduled Meds: . (feeding supplement) PROSource Plus  30 mL Oral BID BM  . allopurinol  100 mg Oral Daily  . brimonidine  1 drop Both Eyes BID  . chlorhexidine  15 mL Mouth Rinse BID  . Chlorhexidine Gluconate Cloth  6 each Topical Daily  . cycloSPORINE  1 drop Both Eyes BID  . dorzolamide  1 drop Both Eyes Daily  . feeding supplement  237 mL Oral BID BM  . FLUoxetine  40 mg Oral Daily  . furosemide  80 mg Intravenous BID  . insulin aspart  0-5 Units  Subcutaneous QHS  . insulin aspart  0-9 Units Subcutaneous TID WC  . insulin glargine  5 Units Subcutaneous BID  . latanoprost  1 drop Both Eyes QHS  . mouth rinse  15 mL Mouth Rinse q12n4p  . metoprolol tartrate  25 mg Oral BID  . multivitamin with minerals  1 tablet Oral Daily  . mycophenolate  180 mg Oral  BID  . nutrition supplement (JUVEN)  1 packet Oral BID BM  . predniSONE  7.5 mg Oral Q breakfast  . sodium chloride flush  3 mL Intravenous Q12H  . sodium chloride flush  5 mL Intracatheter Q8H  . tacrolimus  1 mg Oral BID  . vancomycin  125 mg Oral QID   Continuous Infusions: . sodium chloride 10 mL/hr at 01/28/21 0800  . sodium chloride 500 mL (01/28/21 0835)  . ceFEPime (MAXIPIME) IV Stopped (01/27/21 1816)  . heparin 900 Units/hr (01/28/21 0800)  . metronidazole Stopped (01/28/21 0935)     LOS: 8 days     Cordelia Poche, MD [facility] 01/28/2021, 12:48 PM  If 7PM-7AM, please contact night-coverage [email or web address]

## 2021-01-28 NOTE — Progress Notes (Signed)
CC: Nonresponsive/hypotensive  Subjective: She looks better than she did when she came in last week.  She is eating some although she says she is not eating a great deal.  The IR drain is draining a white purulent fluid.  Her midline incision looks 100% better than it did when she came in.  Objective: Vital signs in last 24 hours: Temp:  [97.4 F (36.3 C)-98.2 F (36.8 C)] 97.4 F (36.3 C) (03/08 0400) Pulse Rate:  [60-83] 63 (03/08 0600) Resp:  [10-21] 16 (03/08 0600) BP: (127-162)/(52-69) 138/52 (03/08 0600) SpO2:  [98 %-100 %] 100 % (03/08 0600) Weight:  [73.3 kg] 73.3 kg (03/08 0500) Last BM Date: 01/27/21 480 p.o. recorded 800 IV 2200 urine Drain 12.5 Stool 50 recorded Afebrile vital signs are stable. WBC 6.3 H/H 8.8/29.1 Platelets 157,000   Intake/Output from previous day: 03/07 0701 - 03/08 0700 In: 1349.6 [P.O.:480; I.V.:470.8; IV Piggyback:398.8] Out: 2262.5 [Urine:2200; Drains:12.5; Stool:50] Intake/Output this shift: No intake/output data recorded.  General appearance: alert, cooperative, no distress and Still weak and frail but better than on admission. Resp: Clear anterior. GI: Midline incision looks good.,  JP drain with white purulent fluid.  Tolerating D3 diet, colostomy is working well with stool and gas coming from the colostomy.  Lab Results:  Recent Labs    01/27/21 0338 01/28/21 0246  WBC 6.2 6.3  HGB 8.6* 8.8*  HCT 28.0* 29.1*  PLT 166 157    BMET Recent Labs    01/26/21 1208 01/27/21 0338  NA 134* 133*  K 3.3* 3.5  CL 103 106  CO2 20* 22  GLUCOSE 200* 183*  BUN 65* 64*  CREATININE 2.12* 1.92*  CALCIUM 8.9 8.9   PT/INR No results for input(s): LABPROT, INR in the last 72 hours.  Recent Labs  Lab 01/22/21 0258  AST 20  ALT 9  ALKPHOS 80  BILITOT 1.1  PROT 5.7*  ALBUMIN 2.1*     Lipase     Component Value Date/Time   LIPASE 16 01/13/2021 1343     Medications: . (feeding supplement) PROSource Plus  30 mL  Oral BID BM  . allopurinol  100 mg Oral Daily  . brimonidine  1 drop Both Eyes BID  . chlorhexidine  15 mL Mouth Rinse BID  . Chlorhexidine Gluconate Cloth  6 each Topical Daily  . cycloSPORINE  1 drop Both Eyes BID  . dorzolamide  1 drop Both Eyes Daily  . feeding supplement  237 mL Oral BID BM  . FLUoxetine  40 mg Oral Daily  . furosemide  80 mg Intravenous BID  . insulin aspart  0-5 Units Subcutaneous QHS  . insulin aspart  0-9 Units Subcutaneous TID WC  . insulin glargine  5 Units Subcutaneous BID  . latanoprost  1 drop Both Eyes QHS  . mouth rinse  15 mL Mouth Rinse q12n4p  . metoprolol tartrate  25 mg Oral BID  . multivitamin with minerals  1 tablet Oral Daily  . mycophenolate  180 mg Oral BID  . nutrition supplement (JUVEN)  1 packet Oral BID BM  . predniSONE  7.5 mg Oral Q breakfast  . sodium chloride flush  3 mL Intravenous Q12H  . sodium chloride flush  5 mL Intracatheter Q8H  . tacrolimus  1 mg Oral BID  . vancomycin  125 mg Oral QID    Assessment/Plan Polycystic disease/ESRDwith renal transplant 2013-Prograf/prednisone. Elevated Cr- 2.19>>2.09>> 1.92(3/7) Hx CHF Chronic COPD/history of tobacco use- On 2L PRN  at home -Mostly off CPAP now. HxAge indeterminate, non occlusive deep vein thrombosis involving the right popliteal vein Hx SVT/Atrial flutter with RVR/TEE/DC cardioversion 12/25/20 Large hematoma left arm - drained and dressed- cleaning up hematoma still present Anemia- H/H 8.5/27.1>>7.3/24.1>> 8.8/29.1 Severe protein calorie malnutrition-Total protein 6.0/albumin 1.9 Hyponatremia Hypercoagulable - INR 2.8 (on Eliquis) Severe malnutrition-prealbumin<5 C. difficile colitis -antigen/toxin + 01/21/2021, tx per medicine  Sepsis and 3.2 x 2.8 x 5.3 cm abdominal fluid/abscess collectionadjacent to enterotomy staple line HxPerforated sigmoid diverticulitis -S/pExploratory laparotomy with sigmoid colectomy/end colostomy, 12/29/2020 Dr. Coralie Keens -IR drain placed 3/4, cx shows multiple organisms present, none predominant, no anaerobes isolated -cont local wound care with mepitel over wound base and NS WD dressing changes BID.   FEN:D3 diet, supplements KG:URKYHCWC/BJSEGB 2/28>> day 9, Vancomycin IV 2/28 - Vancomycin PO 3/1 >>day TDV:VOHYWVP gtt        LOS: 8 days    April [name] 01/28/2021 Please see Amion

## 2021-01-28 NOTE — Progress Notes (Signed)
Referring Physician(s): Ok Anis  Supervising Physician: Jacqulynn Cadet  Patient Status:  April [name] IP  Chief Complaint: Abdominal pain/fluid collection/abscess  Subjective: Patient currently resting quietly/asleep; reportedly has minimal appetite, some intermittent nausea   Allergies: Infed [iron dextran], Pentamidine, Budesonide-formoterol fumarate, Bupropion, Crestor [rosuvastatin], Duloxetine hcl, Erythromycin [erythromycin], Iohexol, Oxycodone, Pravastatin sodium, Erythromycin, and Ultram [tramadol hcl]  Medications: Prior to Admission medications   Medication Sig Start Date End Date Taking? Authorizing Provider  acetaminophen (TYLENOL) 325 MG tablet Take 2 tablets (650 mg total) by mouth every 6 (six) hours as needed for mild pain (or Fever >/= 101). 05/22/20  Yes Elgergawy, Silver Huguenin, MD  allopurinol (ZYLOPRIM) 100 MG tablet Take 100 mg by mouth daily. 11/29/20  Yes [provider]  amLODipine (NORVASC) 5 MG tablet Take 1 tablet (5 mg total) by mouth daily. 01/15/21  Yes Mercy Riding, MD  apixaban (ELIQUIS) 5 MG TABS tablet Take 1 tablet (5 mg total) by mouth 2 (two) times daily. 01/14/21  Yes Mercy Riding, MD  brimonidine (ALPHAGAN P) 0.1 % SOLN Place 1 drop into both eyes 2 (two) times daily.    Yes [provider]  Budeson-Glycopyrrol-Formoterol (BREZTRI AEROSPHERE) 160-9-4.8 MCG/ACT AERO Inhale 2 puffs into the lungs 2 (two) times daily. 08/20/20  Yes Martyn Ehrich, NP  cholecalciferol (VITAMIN D3) 25 MCG (1000 UNIT) tablet Take 1 tablet (1,000 Units total) by mouth daily. 01/04/20  Yes Angiulli, Lavon Paganini, PA-C  colchicine 0.6 MG tablet Take 0.5 tablets (0.3 mg total) by mouth daily. 01/15/21  Yes Mercy Riding, MD  cyanocobalamin 1000 MCG tablet Take 1,000 mcg by mouth daily.   Yes [provider]  cycloSPORINE (RESTASIS) 0.05 % ophthalmic emulsion Place 1 drop into both eyes 2 (two) times daily. 01/04/20  Yes Angiulli, Lavon Paganini, PA-C  dorzolamide  (TRUSOPT) 2 % ophthalmic solution Place 1 drop into both eyes daily. 06/25/19  Yes [provider]  ferrous sulfate 325 (65 FE) MG tablet Take 325 mg by mouth daily with breakfast.   Yes [provider]  FLUoxetine (PROZAC) 40 MG capsule Take 1 capsule (40 mg total) by mouth daily. 01/04/20  Yes Angiulli, Lavon Paganini, PA-C  furosemide (LASIX) 40 MG tablet Take 1 tablet (40 mg total) by mouth 2 (two) times daily. 01/14/21  Yes Mercy Riding, MD  gabapentin (NEURONTIN) 100 MG capsule Take 200 mg by mouth 2 (two) times daily.   Yes [provider]  Glucerna (GLUCERNA) LIQD Take 237 mLs by mouth 2 (two) times daily between meals.   Yes [provider]  guaiFENesin (MUCINEX) 600 MG 12 hr tablet Take 600 mg by mouth 2 (two) times daily.   Yes [provider]  HYDROcodone-acetaminophen (NORCO/VICODIN) 5-325 MG tablet Take 1 tablet by mouth every 12 (twelve) hours as needed for moderate pain.   Yes [provider]  insulin aspart (NOVOLOG) 100 UNIT/ML injection Inject 0-9 Units into the skin 3 (three) times daily with meals. CBG 70 - 120: 0 units CBG 121 - 150: 1 unit CBG 151 - 200: 2 units CBG 201 - 250: 3 units CBG 251 - 300: 5 units CBG 301 - 350: 7 units CBG 351 - 400: 9 units 01/14/21  Yes Gonfa, Taye T, MD  insulin glargine (LANTUS) 100 UNIT/ML injection Inject 0.05 mLs (5 Units total) into the skin 2 (two) times daily. 01/14/21  Yes Mercy Riding, MD  metoprolol tartrate (LOPRESSOR) 25 MG tablet Take 1 tablet (25 mg  total) by mouth 2 (two) times daily. 01/14/21  Yes Mercy Riding, MD  mycophenolate (MYFORTIC) 180 MG EC tablet Take 180 mg by mouth 2 (two) times daily.   Yes [provider]  Nutritional Supplements (,FEEDING SUPPLEMENT, PROSOURCE PLUS) liquid Take 30 mLs by mouth 2 (two) times daily between meals. 01/14/21  Yes Mercy Riding, MD  ondansetron (ZOFRAN) 4 MG tablet Take 1 tablet (4 mg total) by mouth every 8 (eight) hours as needed for  nausea or vomiting. 01/14/21 01/14/22 Yes Mercy Riding, MD  pantoprazole (PROTONIX) 40 MG tablet Take 1 tablet (40 mg total) by mouth daily. 01/15/21  Yes Mercy Riding, MD  predniSONE (DELTASONE) 2.5 MG tablet Take 3 tablets (7.5 mg total) by mouth daily with breakfast. 01/15/21  Yes Gonfa, Taye T, MD  PROGRAF 1 MG capsule Take 1 mg by mouth 2 (two) times daily.  03/22/19  Yes [provider]  sodium hypochlorite (DAKIN'S 1/4 STRENGTH) 0.125 % SOLN Irrigate with as directed 2 (two) times daily. 01/14/21  Yes Gonfa, Charlesetta Ivory, MD  TRAVATAN Z 0.004 % SOLN ophthalmic solution Place 1 drop into both eyes at bedtime. 07/03/16  Yes [provider]  valACYclovir (VALTREX) 500 MG tablet Take 500 mg by mouth 2 (two) times daily.   Yes [provider]  albuterol (VENTOLIN HFA) 108 (90 Base) MCG/ACT inhaler Inhale 2 puffs into the lungs every 4 (four) hours as needed for wheezing or shortness of breath. 01/04/20   Angiulli, Lavon Paganini, PA-C  ipratropium-albuterol (DUONEB) 0.5-2.5 (3) MG/3ML SOLN Take 3 mLs by nebulization every 6 (six) hours as needed (sob/wheezing).  08/24/20   [provider]  promethazine (PHENERGAN) 25 MG tablet Take 1 tablet (25 mg total) by mouth every 8 (eight) hours as needed for up to 15 doses for nausea or vomiting. 09/02/20   Curatolo, Adam, DO     Vital Signs: BP (!) 140/53 (BP Location: Left Leg)   Pulse (!) 58   Temp (!) 96.3 F (35.7 C) (Axillary)   Resp 13   Ht 5\' 1"  (1.549 m)   Wt 161 lb 9.6 oz (73.3 kg)   SpO2 100%   BMI 30.53 kg/m   Physical Exam currently asleep, left lower quadrant drain intact, insertion site okay, output about 15 cc of turbid beige fluid.  Drain flushed without difficulty  Imaging: No results found.  Labs:  CBC: Recent Labs    01/25/21 0523 01/26/21 0145 01/27/21 0338 01/28/21 0246  WBC 4.5 8.5 6.2 6.3  HGB 8.5* 8.9* 8.6* 8.8*  HCT 27.7* 28.0* 28.0* 29.1*  PLT 158 223 166 157    COAGS: Recent Labs     01/21/21 0239 01/21/21 1000 01/22/21 0258 01/23/21 0248 01/23/21 1304 01/24/21 0252 01/25/21 0523 01/25/21 1458 01/26/21 0145  INR 3.1*  --  2.2* 1.6*  --  1.9*  --   --   --   APTT 133*   < > 68* 57*   < > 61* 56* 56* 105*   < > = values in this interval not displayed.    BMP: Recent Labs    07/01/20 2334 07/02/20 0030 07/03/20 0350 07/18/20 1545 08/05/20 1523 09/02/20 2049 01/26/21 0145 01/26/21 1208 01/27/21 0338 01/28/21 0830  NA 135   < > 139 131* 140   < > 133* 134* 133* 136  K 4.4   < > 5.1 4.7 4.5   < > 3.1* 3.3* 3.5 3.7  CL 102   < >  108 95* 106   < > 105 103 106 109  CO2 25  --  20* 24 27   < > 19* 20* 22 23  GLUCOSE 135*   < > 187* 187* 189*   < > 214* 200* 183* 92  BUN 28*   < > 42* 30* 45*   < > 65* 65* 64* 56*  CALCIUM 9.7  --  9.0 9.4 10.1   < > 8.9 8.9 8.9 9.2  CREATININE 1.02*   < > 1.17* 1.39* 1.30*   < > 2.01* 2.12* 1.92* 1.87*  GFRNONAA 58*  --  50* 40* 44*   < > 27* 26* 29* 30*  GFRAA >60  --  57* 47* 51*  --   --   --   --   --    < > = values in this interval not displayed.    LIVER FUNCTION TESTS: Recent Labs    01/03/21 0300 01/04/21 0556 01/08/21 1021 01/09/21 0511 01/12/21 0250 01/13/21 0049 12/24/2020 1339 01/22/21 0258  BILITOT 1.4*  --  1.5*  --   --   --  1.2 1.1  AST 11*  --  10*  --   --   --  25 20  ALT 11  --  8  --   --   --  8 9  ALKPHOS 53  --  62  --   --   --  94 80  PROT 5.6*  --  5.4*  --   --   --  6.0* 5.7*  ALBUMIN 2.3*   < > 2.3*   < > 1.9* 2.0* 1.9* 2.1*   < > = values in this interval not displayed.    Assessment and Plan: Pt with hx perf sig diverticulitis, s/p expl lap with sig colectomy/end colostomy 12/29/20; post op left lower abd fluid coll/abscess, s/p drain placement 3/4; c diff +; afebrile, WBC normal, hemoglobin stable, creatinine 1.87 down from 1.92, drain fluid cultures growing multiple organisms; cont current tx/drain irrigation/OP-lab monitoring; check f/u CT once OP minimal for few consecutive  days(also may need drain injection before removal) or if WBC increases/clinical status worsens; other plans as per CCS/TRH   Electronically Signed: D. Rowe Robert, PA-C 01/28/2021, 2:36 PM   I spent a total of 15 minutes at the the patient's bedside AND on the patient's hospital floor or unit, greater than 50% of which was counseling/coordinating care for left lower abdominal abscess drain    Patient ID: April [name], female   DOB: 06/28/1956, 65 y.o.   MRN: [id number]

## 2021-01-28 NOTE — Progress Notes (Signed)
Physical Therapy Treatment Patient Details Name: BRYNNA DOBOS MRN: [id number] DOB: 06-13-1956 Today's Date: 01/28/2021    History of Present Illness RAYCHEL DOWLER is a 65 y.o. female admitted on 01/15/2021 secondary to acute encephalopathy with associated hypotension in setting of intraabdominal fluid collection and recent intraabdominal surgery. General surgery and IR consulted for management.  Found to have C. Difficile.  Noted pt's with open abdominal wound. Recently in [facility] for new onset afib from 12/16/20-01/14/21 , DC to SNF. H/O Left hip and wrist fx 11/2019.  Pt with several recent admissions - review PT notes and PATIENT WAS AMBULATORY prior to 01/02/21.  PMH of atrial flutter s/p DCCV, chronic respiratory failure on 2 lpm of oxygen, asthma, possible OSA, CKD stage IIIa, ESRD s/p renal transplant, diastolic heart failure, diabetes, Raynaud's phenomenon.    PT Comments     Pt making gradual progress.  Session focused on strengthening exercises and EOB balance/tolerance.  Pt was able to tolerate sitting EOB for 15 mins with min guard-min A.  Too weak to progress to standing and would need mechanical lift OOB at this time (RN reports did not tolerate yesterday).  Continue to advance as able.  This pt was ambulatory prior to 01/02/21.    Follow Up Recommendations  SNF     Equipment Recommendations  Wheelchair (measurements PT);Wheelchair cushion (measurements PT);3in1 (PT);Hospital bed (hoyer)    Recommendations for Other Services       Precautions / Restrictions Precautions Precautions: Fall Precaution Comments: colostomy, open abd wound Restrictions Weight Bearing Restrictions: No    Mobility  Bed Mobility Overal bed mobility: Needs Assistance Bed Mobility: Rolling;Sidelying to Sit;Sit to Supine Rolling: Mod assist;+2 for physical assistance Sidelying to sit: Max assist;+2 for physical assistance   Sit to supine: Total assist;+2 for physical assistance   General bed  mobility comments: Rolling: assist to bed opposite leg and reach across with bed pad to facilitate; Sidelying to Sit: log roll technique due to abd wound, max A for legs off bed and to lift trunk with bed pad to facilitate; Sit to Supine: Total x 2 helicopter technique with bed pad    Transfers                 General transfer comment: will require mechanical lift  Ambulation/Gait                 Stairs             Wheelchair Mobility    Modified Rankin (Stroke Patients Only)       Balance Overall balance assessment: Needs assistance Sitting-balance support: Feet supported;Bilateral upper extremity supported Sitting balance-Leahy Scale: Poor Sitting balance - Comments: Sat EOB for at least 15 mins with min guard - min A.  Performed ADLs with OT and LE exercises.       Standing balance comment: unable                            Cognition Arousal/Alertness: Awake/alert Behavior During Therapy: Flat affect Overall Cognitive Status: Within Functional Limits for tasks assessed                                 General Comments: Pt with flat affect but did follow all commands and carried on appropriate conversations      Exercises General Exercises - Lower Extremity Ankle Circles/Pumps: AROM;20 reps;Both;Supine Quad  Sets: AROM;Both;10 reps;Supine Long Arc Quad: AAROM;AROM;10 reps;Seated (started with AAROM progressed to AROM) Heel Slides: AAROM;Both;10 reps;Supine (gradually increased ROM to tolerance; c/o pain in buttock)    General Comments        Pertinent Vitals/Pain Pain Assessment: Faces Faces Pain Scale: Hurts even more Pain Location: buttocks Pain Descriptors / Indicators: Grimacing;Discomfort (with initial heel slides and transfers) Pain Intervention(s): Monitored during session;Limited activity within patient's tolerance;Repositioned    Home Living                      Prior Function            PT  Goals (current goals can now be found in the care plan section) Acute Rehab PT Goals Patient Stated Goal: To go home PT Goal Formulation: With patient Time For Goal Achievement: 2021-02-17 Potential to Achieve Goals: Fair Progress towards PT goals: Progressing toward goals    Frequency    Min 2X/week      PT Plan Current plan remains appropriate    Co-evaluation PT/OT/SLP Co-Evaluation/Treatment: Yes Reason for Co-Treatment: Complexity of the patient's impairments (multi-system involvement);For patient/therapist safety PT goals addressed during session: Mobility/safety with mobility;Balance;Strengthening/ROM OT goals addressed during session: ADL's and self-care;Strengthening/ROM      AM-PAC PT "6 Clicks" Mobility   Outcome Measure  Help needed turning from your back to your side while in a flat bed without using bedrails?: A Lot Help needed moving from lying on your back to sitting on the side of a flat bed without using bedrails?: Total Help needed moving to and from a bed to a chair (including a wheelchair)?: Total Help needed standing up from a chair using your arms (e.g., wheelchair or bedside chair)?: Total Help needed to walk in hospital room?: Total Help needed climbing 3-5 steps with a railing? : Total 6 Click Score: 7    End of Session Equipment Utilized During Treatment: Oxygen Activity Tolerance: Patient limited by fatigue Patient left: in bed;with call bell/phone within reach;with bed alarm set (geo mat under buttock) Nurse Communication: Mobility status;Need for lift equipment (left supine no pillows to offload at this time per pt request) PT Visit Diagnosis: Muscle weakness (generalized) (M62.81);Pain;Unsteadiness on feet (R26.81)     Time: 1829-9371 PT Time Calculation (min) (ACUTE ONLY): 31 min  Charges:  $Therapeutic Activity: 8-22 mins                     Abran Richard, PT Acute Rehab Services Pager [phone] [name]  [phone]     Karlton Lemon 01/28/2021, 11:36 AM

## 2021-01-28 NOTE — Progress Notes (Signed)
   Daily Progress Note   Patient Name: April [name]       Date: 01/28/2021 DOB: [birth year]  Age: 65 y.o. MRN#: [id number] Attending Physician: Mariel Aloe, MD Primary Care Physician: [name], MD Admit Date: 12/29/2020  Reason for Consultation/Follow-up: Establishing goals of care  Subjective: Chart Reviewed and Updates Received.   Patient resting in bed. Easily aroused. Alert and oriented x3. Patient states she is tired. Allowed opportunity for patient to elaborate more on her statement. She is emotional sharing she is tired overall. Shares she has been a lot over the past few months and is becoming concerned. Reports she feels somewhat better today, appetite remains minimal. Emotional support provided.   My colleague Josseline and I created space and opportunity for Mrs. Politano to express her wishes and feelings. Support provided. She shares she is of [place] faith. Offered spiritual support by our Chaplain team. Patient verbalized appreciation.   We discussed what is most important to patient with conversations focused on best case and worst case scenarios. Patient reports although she is tired mentally and physically she is remaining hopeful that she "will get somewhat better".   I created an opportunity to confirm patient's wishes for continued medical interventions and to discuss her full code status with consideration of current illness, co-morbidities, and expressed wishes. Ms. Hartwell states she would not wish to undergo heroic measures at this point in her life and health care journey. Education provided on DNR/DNI. She verbalized understanding and again confirmed wishes for DNR/DNI. Education provided on changes in Epic and RN to place bracelet on to identify her wishes.   Patient states she has not had the opportunity to complete an advanced directive. Education provided and patient is aware this can be completed while hospitalized. She is requesting assistance with  completion.   All questions and support provided.   Length of Stay: 8 days  Vital Signs: BP (!) 140/53 (BP Location: Left Leg)   Pulse (!) 58   Temp 98.3 F (36.8 C) (Oral)   Resp 13   Ht 5\' 1"  (1.549 m)   Wt 73.3 kg   SpO2 100%   BMI 30.53 kg/m  SpO2: SpO2: 100 % O2 Device: O2 Device: Nasal Cannula O2 Flow Rate: O2 Flow Rate (L/min): 2 L/min  Physical Exam: Resting, easily aroused Normal respiratory efforts RRR A&O x3            Palliative Care Assessment & Plan    Code Status:  DNR  Goals of Care/Recommendations:  DNR/DNI-as requested and confirmed by patient  Patient expressing "being tired" in relation to her health and having been through a lot over the past several months. Wishes to continue with current plan of care and hopeful for improvement/stability. Offered Spiritual support. (Chaplain referral placed)  Patient requesting completion with AD. (referral placed)  PMT will continue to support and follow as needed. Please contact team line for urgent needs.   Prognosis: Guarded   Discharge Planning: To Be Determined  Thank you for allowing the Palliative Medicine Team to assist in the care of this patient.  Time Total: 40 min.   Visit consisted of counseling and education dealing with the complex and emotionally intense issues of symptom management and palliative care in the setting of serious and potentially life-threatening illness.Greater than 50%  of this time was spent counseling and coordinating care related to the above assessment and plan.  Alda Lea, AGPCNP-BC  Palliative Medicine Team [phone]

## 2021-01-28 NOTE — Progress Notes (Signed)
SLP Cancellation Note  Patient Details Name: April [name] MRN: [id number] DOB: July 28, 1956   Cancelled treatment:       Reason Eval/Treat Not Completed: Other (comment) (not alert enough for full cognitive eval, will continue efforts.)  Kathleen Lime, MS [place] [facility] [phone] Pager [phone]   [name] 01/28/2021, 3:37 PM

## 2021-01-28 NOTE — Progress Notes (Signed)
Nutrition Follow-up  DOCUMENTATION CODES:   Not applicable  INTERVENTION:  - continue Ensure Enlive BID, Juven BID, Prosource Plus BID. - will order Magic Cup TID with meals, each supplement provides 290 kcal and 9 grams of protein. - monitor for Marshall decisions.  - complete NFPE when feasible.   NUTRITION DIAGNOSIS:   Increased nutrient needs related to acute illness,wound healing as evidenced by estimated needs. -ongoing  GOAL:   Patient will meet greater than or equal to 90% of their needs -unmet  MONITOR:   PO intake,Supplement acceptance,Labs,Weight trends  ASSESSMENT:   65 y.o. 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 She was recently hospitalized for perforated diverticulitis and septic shock and underwent ex lap and colostomy. She presented to the ED after being found unresponsive at SNF with O2 sat of 74% on 4L O2. CT chest and abdomen/pelvis showed possible post-op seroma vs developing abscess. General Surgery consulted.  The only documented intakes are 30% of lunch and 10% of dinner on 3/3 and 5% of lunch yesterday.   Patient laying in bed with no family or visitors present. Patient working with PT at the time of visit. Able to talk with RN prior to and after visit to patient's room. Palliative Care consult was ordered yesterday. Patient continues with poor appetite but does like ice cream and RN requests order for [employer].  Breakfast tray on bedside table and is untouched. Patient denies abdominal pain or nausea and states that flavor of the food is "ok" but states that she does not want to eat breakfast today.   Review of orders indicate that she has been accepting Ensure 75-90% of the time offered, Juven 100% of the time offered, and Prosource 50% of the time offered.   Weight  has been stable throughout hospitalization. Mild pitting edema to BUE and moderate pitting to BLE documented in the edema section of flow sheet.    Labs reviewed; CBG: 83 mg/dl, BUN: 56 mg/dl, creatinine: 1.87 mg/dl, GFR: 30 ml/min. Medications reviewed; 80 mg IV lasix BID, sliding scale novolog, 5 units lantus BID, 1 tablet multivitamin with minerals/day, 7.5 mg deltasone/day.    NUTRITION - FOCUSED PHYSICAL EXAM:  unable to complete as patient is working with PT.  Diet Order:   Diet Order            DIET DYS 3 Room service appropriate? Yes with Assist; Fluid consistency: Thin  Diet effective now                 EDUCATION NEEDS:   Not appropriate for education at this time  Skin:  Skin Integrity Issues:: Stage II,Other (Comment) Stage II: sacrum Other: MASD bilateral buttocks  Last BM:  3/7 (type 6 x1)  Height:   Ht Readings from Last 1 Encounters:  01/02/2021 5\' 1"  (1.549 m)    Weight:   Wt Readings from Last 1 Encounters:  01/28/21 73.3 kg    Estimated Nutritional Needs:  Kcal:  1800-2000 kcal Protein:  85-100 grams Fluid:  >/= 1.5 L/day      Jarome Matin, MS, RD, LDN, CNSC Inpatient Clinical Dietitian RD pager # available in AMION  After hours/weekend pager # available in [facility]

## 2021-01-28 NOTE — Progress Notes (Addendum)
Providing support in response to spiritual care consult.    Pt requested support around advance directives and progression of illness.   April [name] completed advance directive document - naming spouse, April [name], and daughter, April [name], Health care power of attorneys.    [name] also completed the living will.     Chaplain attempting to secure notary for document.

## 2021-01-28 NOTE — Progress Notes (Signed)
ANTICOAGULATION CONSULT NOTE - Follow Up Consult  Pharmacy Consult for Heparin Indication: atrial fibrillation  Allergies  Allergen Reactions  . Infed [Iron Dextran] Other (See Comments)    Chest tightness  . Pentamidine Itching, Shortness Of Breath and Swelling  . Budesonide-Formoterol Fumarate     Other reaction(s): hoarseness  . Bupropion     Other reaction(s): exacerbated depression  . Crestor [Rosuvastatin]     Other reaction(s): body aches, swelling  . Duloxetine Hcl     Other reaction(s): GI SE, weepy, worsening fatigue, foggy  . Erythromycin [Erythromycin] Other (See Comments)    Mouth Ulcers  . Iohexol Other (See Comments)    Per patient "she has had a kidney transplant and should never have contrast"  . Oxycodone Nausea Only and Nausea And Vomiting  . Pravastatin Sodium     Other reaction(s): myalgias  . Erythromycin Rash    Causes breakout in mouth  . Ultram [Tramadol Hcl] Anxiety    Patient Measurements: Height: 5\' 1"  (154.9 cm) Weight: 73.3 kg (161 lb 9.6 oz) IBW/kg (Calculated) : 47.8 Heparin Dosing Weight: 63kg  Vital Signs: Temp: 98.3 F (36.8 C) (03/08 0800) Temp Source: Oral (03/08 0800) BP: 140/53 (03/08 0800) Pulse Rate: 58 (03/08 0800)  Labs: Recent Labs    01/25/21 1458 01/25/21 1458 01/26/21 0145 01/26/21 1208 01/27/21 0338 01/28/21 0246 01/28/21 0830  HGB  --    < > 8.9*  --  8.6* 8.8*  --   HCT  --   --  28.0*  --  28.0* 29.1*  --   PLT  --   --  223  --  166 157  --   APTT 56*  --  105*  --   --   --   --   HEPARINUNFRC 0.53  --  0.52 0.59 0.48 0.44  --   CREATININE  --    < > 2.01* 2.12* 1.92*  --  1.87*   < > = values in this interval not displayed.    Estimated Creatinine Clearance: 28.2 mL/min (A) (by C-G formula based on SCr of 1.87 mg/dL (H)).   Assessment: 77 yoF with PMH PAF s/p DCCV (12/25/20) on Eliquis, chronic resp fail (2L O2 baseline), CKD3 s/p renal txplant, dCHF, DM2, admitted after being found unresponsive at  [facility]. Concern for infection after recent abdominal surgery; Pharmacy consulted to convert Eliquis to IV heparin while possible procedures pending. During last admission, heparin was especially difficult to control, with rates ranging from 1650 units/hr down to 250 units/hr. Patient also noted to have significant bruising and hematomas on several extremities last admission. However, Cardiology has recommended at least 30 days of anticoagulation after DCCV (through 3/4).  PTA Eliquis 5mg  BID, last dose 2/28 09:00.  Baseline INR elevated; aPTT not done  3/4: s/p LLQ abscess drain.  Heparin off 0900-2130.  Resumed without bolus.   Today, 01/28/2021:   Heparin level 0.44 remains therapeutic on heparin 900 units/hr   CBC: Hgb 8.8 stable, Plt 157  Known bruising.  No bleeding reported.  No infusion problems.  Goal of Therapy:  Heparin level 0.3-0.7 units/ml aPTT 66-102 seconds Monitor platelets by anticoagulation protocol: Yes   Plan:   Continue heparin 900 units/hr   Daily CBC and heparin level  Monitor for s/sx of bleeding  Follow up plans for long-term oral anticoagulation.   Ulice Dash D  01/28/2021 11:45 AM

## 2021-01-29 DIAGNOSIS — G9341 Metabolic encephalopathy: Secondary | ICD-10-CM | POA: Diagnosis not present

## 2021-01-29 LAB — CBC
HCT: 31.9 % — ABNORMAL LOW (ref 36.0–46.0)
Hemoglobin: 9.6 g/dL — ABNORMAL LOW (ref 12.0–15.0)
MCH: 31.8 pg (ref 26.0–34.0)
MCHC: 30.1 g/dL (ref 30.0–36.0)
MCV: 105.6 fL — ABNORMAL HIGH (ref 80.0–100.0)
Platelets: 140 10*3/uL — ABNORMAL LOW (ref 150–400)
RBC: 3.02 MIL/uL — ABNORMAL LOW (ref 3.87–5.11)
RDW: 22.2 % — ABNORMAL HIGH (ref 11.5–15.5)
WBC: 6.6 10*3/uL (ref 4.0–10.5)
nRBC: 0 % (ref 0.0–0.2)

## 2021-01-29 LAB — BASIC METABOLIC PANEL
Anion gap: 9 (ref 5–15)
BUN: 63 mg/dL — ABNORMAL HIGH (ref 8–23)
CO2: 22 mmol/L (ref 22–32)
Calcium: 9 mg/dL (ref 8.9–10.3)
Chloride: 108 mmol/L (ref 98–111)
Creatinine, Ser: 1.87 mg/dL — ABNORMAL HIGH (ref 0.44–1.00)
GFR, Estimated: 30 mL/min — ABNORMAL LOW (ref >60)
Glucose, Bld: 73 mg/dL (ref 70–99)
Potassium: 3.6 mmol/L (ref 3.5–5.1)
Sodium: 139 mmol/L (ref 135–145)

## 2021-01-29 LAB — GLUCOSE, CAPILLARY
Glucose-Capillary: 65 mg/dL — ABNORMAL LOW (ref 70–99)
Glucose-Capillary: 77 mg/dL (ref 70–99)
Glucose-Capillary: 121 mg/dL — ABNORMAL HIGH (ref 70–99)
Glucose-Capillary: 223 mg/dL — ABNORMAL HIGH (ref 70–99)
Glucose-Capillary: 203 mg/dL — ABNORMAL HIGH (ref 70–99)

## 2021-01-29 LAB — HEPARIN LEVEL (UNFRACTIONATED): Heparin Unfractionated: 0.6 IU/mL (ref 0.30–0.70)

## 2021-01-29 MED ORDER — HYDROCODONE-ACETAMINOPHEN 5-325 MG PO TABS
1.0000 | ORAL_TABLET | ORAL | Status: DC | PRN
  Administered 2021-01-29: 1 via ORAL
  Administered 2021-01-29: 2 via ORAL
  Administered 2021-01-29: 1 via ORAL
  Administered 2021-01-30 – 2021-02-01 (×2): 2 via ORAL
  Administered 2021-02-05: 1 via ORAL
  Filled 2021-01-29 (×3): qty 2
  Filled 2021-01-29 (×3): qty 1

## 2021-01-29 MED ORDER — INSULIN GLARGINE 100 UNIT/ML ~~LOC~~ SOLN
5.0000 [IU] | Freq: Every day | SUBCUTANEOUS | Status: DC
  Administered 2021-01-30: 5 [IU] via SUBCUTANEOUS
  Filled 2021-01-29 (×3): qty 0.05

## 2021-01-29 NOTE — Progress Notes (Signed)
Referring Physician(s): Gillham  Supervising Physician: Jacqulynn Cadet  Patient Status:  [facility] - In-pt  Chief Complaint: Abdominal pain/fluid collection/abscess   Subjective: Pt awake but sl drowsy, complaining of some discomfort in buttocks region; no N/V   Allergies: Infed [iron dextran], Pentamidine, Budesonide-formoterol fumarate, Bupropion, Crestor [rosuvastatin], Duloxetine hcl, Erythromycin [erythromycin], Iohexol, Oxycodone, Pravastatin sodium, Erythromycin, and Ultram [tramadol hcl]  Medications: Prior to Admission medications   Medication Sig Start Date End Date Taking? Authorizing Provider  acetaminophen (TYLENOL) 325 MG tablet Take 2 tablets (650 mg total) by mouth every 6 (six) hours as needed for mild pain (or Fever >/= 101). 05/22/20  Yes Elgergawy, Silver Huguenin, MD  allopurinol (ZYLOPRIM) 100 MG tablet Take 100 mg by mouth daily. 11/29/20  Yes [provider]  amLODipine (NORVASC) 5 MG tablet Take 1 tablet (5 mg total) by mouth daily. 01/15/21  Yes Mercy Riding, MD  apixaban (ELIQUIS) 5 MG TABS tablet Take 1 tablet (5 mg total) by mouth 2 (two) times daily. 01/14/21  Yes Mercy Riding, MD  brimonidine (ALPHAGAN P) 0.1 % SOLN Place 1 drop into both eyes 2 (two) times daily.    Yes [provider]  Budeson-Glycopyrrol-Formoterol (BREZTRI AEROSPHERE) 160-9-4.8 MCG/ACT AERO Inhale 2 puffs into the lungs 2 (two) times daily. 08/20/20  Yes Martyn Ehrich, NP  cholecalciferol (VITAMIN D3) 25 MCG (1000 UNIT) tablet Take 1 tablet (1,000 Units total) by mouth daily. 01/04/20  Yes Angiulli, Lavon Paganini, PA-C  colchicine 0.6 MG tablet Take 0.5 tablets (0.3 mg total) by mouth daily. 01/15/21  Yes Mercy Riding, MD  cyanocobalamin 1000 MCG tablet Take 1,000 mcg by mouth daily.   Yes [provider]  cycloSPORINE (RESTASIS) 0.05 % ophthalmic emulsion Place 1 drop into both eyes 2 (two) times daily. 01/04/20  Yes Angiulli, Lavon Paganini, PA-C  dorzolamide (TRUSOPT) 2  % ophthalmic solution Place 1 drop into both eyes daily. 06/25/19  Yes [provider]  ferrous sulfate 325 (65 FE) MG tablet Take 325 mg by mouth daily with breakfast.   Yes [provider]  FLUoxetine (PROZAC) 40 MG capsule Take 1 capsule (40 mg total) by mouth daily. 01/04/20  Yes Angiulli, Lavon Paganini, PA-C  furosemide (LASIX) 40 MG tablet Take 1 tablet (40 mg total) by mouth 2 (two) times daily. 01/14/21  Yes Mercy Riding, MD  gabapentin (NEURONTIN) 100 MG capsule Take 200 mg by mouth 2 (two) times daily.   Yes [provider]  Glucerna (GLUCERNA) LIQD Take 237 mLs by mouth 2 (two) times daily between meals.   Yes [provider]  guaiFENesin (MUCINEX) 600 MG 12 hr tablet Take 600 mg by mouth 2 (two) times daily.   Yes [provider]  HYDROcodone-acetaminophen (NORCO/VICODIN) 5-325 MG tablet Take 1 tablet by mouth every 12 (twelve) hours as needed for moderate pain.   Yes [provider]  insulin aspart (NOVOLOG) 100 UNIT/ML injection Inject 0-9 Units into the skin 3 (three) times daily with meals. CBG 70 - 120: 0 units CBG 121 - 150: 1 unit CBG 151 - 200: 2 units CBG 201 - 250: 3 units CBG 251 - 300: 5 units CBG 301 - 350: 7 units CBG 351 - 400: 9 units 01/14/21  Yes Gonfa, Taye T, MD  insulin glargine (LANTUS) 100 UNIT/ML injection Inject 0.05 mLs (5 Units total) into the skin 2 (two) times daily. 01/14/21  Yes Mercy Riding, MD  metoprolol tartrate (LOPRESSOR) 25 MG tablet  Take 1 tablet (25 mg total) by mouth 2 (two) times daily. 01/14/21  Yes Mercy Riding, MD  mycophenolate (MYFORTIC) 180 MG EC tablet Take 180 mg by mouth 2 (two) times daily.   Yes [provider]  Nutritional Supplements (,FEEDING SUPPLEMENT, PROSOURCE PLUS) liquid Take 30 mLs by mouth 2 (two) times daily between meals. 01/14/21  Yes Mercy Riding, MD  ondansetron (ZOFRAN) 4 MG tablet Take 1 tablet (4 mg total) by mouth every 8 (eight) hours as needed for nausea or  vomiting. 01/14/21 01/14/22 Yes Mercy Riding, MD  pantoprazole (PROTONIX) 40 MG tablet Take 1 tablet (40 mg total) by mouth daily. 01/15/21  Yes Mercy Riding, MD  predniSONE (DELTASONE) 2.5 MG tablet Take 3 tablets (7.5 mg total) by mouth daily with breakfast. 01/15/21  Yes Gonfa, Taye T, MD  PROGRAF 1 MG capsule Take 1 mg by mouth 2 (two) times daily.  03/22/19  Yes [provider]  sodium hypochlorite (DAKIN'S 1/4 STRENGTH) 0.125 % SOLN Irrigate with as directed 2 (two) times daily. 01/14/21  Yes Gonfa, Charlesetta Ivory, MD  TRAVATAN Z 0.004 % SOLN ophthalmic solution Place 1 drop into both eyes at bedtime. 07/03/16  Yes [provider]  valACYclovir (VALTREX) 500 MG tablet Take 500 mg by mouth 2 (two) times daily.   Yes [provider]  albuterol (VENTOLIN HFA) 108 (90 Base) MCG/ACT inhaler Inhale 2 puffs into the lungs every 4 (four) hours as needed for wheezing or shortness of breath. 01/04/20   Angiulli, Lavon Paganini, PA-C  ipratropium-albuterol (DUONEB) 0.5-2.5 (3) MG/3ML SOLN Take 3 mLs by nebulization every 6 (six) hours as needed (sob/wheezing).  08/24/20   [provider]  promethazine (PHENERGAN) 25 MG tablet Take 1 tablet (25 mg total) by mouth every 8 (eight) hours as needed for up to 15 doses for nausea or vomiting. 09/02/20   Curatolo, Adam, DO     Vital Signs: BP (!) 124/100   Pulse 68   Temp 98.1 F (36.7 C) (Axillary)   Resp 18   Ht 5\' 1"  (1.549 m)   Wt 154 lb 15.7 oz (70.3 kg)   SpO2 100%   BMI 29.28 kg/m   Physical Exam awake but slightly drowsy.  Left lower quadrant drain intact, insertion site okay, slightly tender to palpation, output about 10 cc of turbid, beige-colored fluid.  Drain flushed without difficulty  Imaging: No results found.  Labs:  CBC: Recent Labs    01/26/21 0145 01/27/21 0338 01/28/21 0246 01/29/21 0244  WBC 8.5 6.2 6.3 6.6  HGB 8.9* 8.6* 8.8* 9.6*  HCT 28.0* 28.0* 29.1* 31.9*  PLT 223 166 157 140*    COAGS: Recent  Labs    01/21/21 0239 01/21/21 1000 01/22/21 0258 01/23/21 0248 01/23/21 1304 01/24/21 0252 01/25/21 0523 01/25/21 1458 01/26/21 0145  INR 3.1*  --  2.2* 1.6*  --  1.9*  --   --   --   APTT 133*   < > 68* 57*   < > 61* 56* 56* 105*   < > = values in this interval not displayed.    BMP: Recent Labs    07/01/20 2334 07/02/20 0030 07/03/20 0350 07/18/20 1545 08/05/20 1523 09/02/20 2049 01/26/21 1208 01/27/21 0338 01/28/21 0830 01/29/21 0244  NA 135   < > 139 131* 140   < > 134* 133* 136 139  K 4.4   < > 5.1 4.7 4.5   < > 3.3* 3.5 3.7 3.6  CL 102   < > 108 95* 106   < > 103 106 109 108  CO2 25  --  20* 24 27   < > 20* 22 23 22   GLUCOSE 135*   < > 187* 187* 189*   < > 200* 183* 92 73  BUN 28*   < > 42* 30* 45*   < > 65* 64* 56* 63*  CALCIUM 9.7  --  9.0 9.4 10.1   < > 8.9 8.9 9.2 9.0  CREATININE 1.02*   < > 1.17* 1.39* 1.30*   < > 2.12* 1.92* 1.87* 1.87*  GFRNONAA 58*  --  50* 40* 44*   < > 26* 29* 30* 30*  GFRAA >60  --  57* 47* 51*  --   --   --   --   --    < > = values in this interval not displayed.    LIVER FUNCTION TESTS: Recent Labs    01/03/21 0300 01/04/21 0556 01/08/21 1021 01/09/21 0511 01/12/21 0250 01/13/21 0049 01/01/2021 1339 01/22/21 0258  BILITOT 1.4*  --  1.5*  --   --   --  1.2 1.1  AST 11*  --  10*  --   --   --  25 20  ALT 11  --  8  --   --   --  8 9  ALKPHOS 53  --  62  --   --   --  94 80  PROT 5.6*  --  5.4*  --   --   --  6.0* 5.7*  ALBUMIN 2.3*   < > 2.3*   < > 1.9* 2.0* 1.9* 2.1*   < > = values in this interval not displayed.    Assessment and Plan: Pt with hx perf sig diverticulitis, s/p expl lap with sig colectomy/end colostomy 12/29/20; post op left lower abd fluid coll/abscess, s/p drain placement 3/4; c diff +; afebrile, WBC normal, hemoglobin 9.6, creatinine 1.87; cont current tx/drain irrigation/OP-lab monitoring; check f/u CT once OP minimal for few consecutive days(also may need drain injection before removal) or if WBC  increases/clinical status worsens; other plans as per CCS/TRH  Electronically Signed: D. Rowe Robert, PA-C 01/29/2021, 1:06 PM   I spent a total of 15 minutes at the the patient's bedside AND on the patient's hospital floor or unit, greater than 50% of which was counseling/coordinating care for left lower abdominal abscess drain    Patient ID: April [name], female   DOB: 1956-03-14, 65 y.o.   MRN: [id number]

## 2021-01-29 NOTE — Progress Notes (Signed)
Pharmacy Antibiotic Note  April [name] is a 65 y.o. female admitted on 01/15/2021 with abdominal wound infection, s/p IR drain on 3/4, cultures remain in progress.  Pharmacy has been consulted for cefepime dosing. Also on PO vancomycin for +C.diff  Plan:  Continue Cefepime 2g IV q24 per current renal function  Flagyl 500mg  IV q8h and Vanc PO per MD  Follow up renal function & cultures  Height: 5\' 1"  (154.9 cm) Weight: 70.3 kg (154 lb 15.7 oz) IBW/kg (Calculated) : 47.8  Temp (24hrs), Avg:97.3 F (36.3 C), Min:96.3 F (35.7 C), Max:98 F (36.7 C)  Recent Labs  Lab 01/25/21 0523 01/26/21 0145 01/26/21 1208 01/27/21 0338 01/28/21 0246 01/28/21 0830 01/29/21 0244  WBC 4.5 8.5  --  6.2 6.3  --  6.6  CREATININE 2.00* 2.01* 2.12* 1.92*  --  1.87* 1.87*    Estimated Creatinine Clearance: 27.6 mL/min (A) (by C-G formula based on SCr of 1.87 mg/dL (H)).    Allergies  Allergen Reactions  . Infed [Iron Dextran] Other (See Comments)    Chest tightness  . Pentamidine Itching, Shortness Of Breath and Swelling  . Budesonide-Formoterol Fumarate     Other reaction(s): hoarseness  . Bupropion     Other reaction(s): exacerbated depression  . Crestor [Rosuvastatin]     Other reaction(s): body aches, swelling  . Duloxetine Hcl     Other reaction(s): GI SE, weepy, worsening fatigue, foggy  . Erythromycin [Erythromycin] Other (See Comments)    Mouth Ulcers  . Iohexol Other (See Comments)    Per patient "she has had a kidney transplant and should never have contrast"  . Oxycodone Nausea Only and Nausea And Vomiting  . Pravastatin Sodium     Other reaction(s): myalgias  . Erythromycin Rash    Causes breakout in mouth  . Ultram [Tramadol Hcl] Anxiety   Antimicrobials this admission: 2/28 vanc IV >>3/2 2/28 cefepime >> 2/28 flagyl >> 3/1 Vanc PO >>   Dose adjustments this admission:  Microbiology results: 2/28 Covid: negative 2/28 MRSA PCR: negative 2/28 BCx: NGF 2/28  UCx: NGF 3/1 CDiff: Ag +, toxin + 3/4 Abscess: moderate GPC, few GPR, rare GNR  Thank you for allowing pharmacy to be a part of this patient's care.  Ulice Dash D  01/29/2021 8:32 AM

## 2021-01-29 NOTE — Progress Notes (Signed)
CC: Nonresponsive/hypotension  Subjective: Her glucose is down in the 60s says she is not as responsive are bright and she was yesterday.  P.o. intake remains a problem.  She is complaining of pain in her back around the drain and buttocks.  She continues to have white purulent drainage from the JP drain.  Her midline incision continues to look good.  Objective: Vital signs in last 24 hours: Temp:  [96.3 F (35.7 C)-98.3 F (36.8 C)] 97.4 F (36.3 C) (03/09 0000) Pulse Rate:  [58-71] 68 (03/09 0000) Resp:  [13-21] 16 (03/09 0000) BP: (138-157)/(53-135) 150/55 (03/09 0000) SpO2:  [100 %] 100 % (03/09 0000) Weight:  [70.3 kg] 70.3 kg (03/09 0500) Last BM Date: 01/28/21 120 p.o. 800 IV 1750 urine Drain 5 No stool recorded Afebrile, vital signs are stable. Sats are 100% on 2 L nasal cannula Weight 73.3>>70.3 Creatinine 1.87   Intake/Output from previous day: 03/08 0701 - 03/09 0700 In: 937 [P.O.:120; I.V.:418.8; IV Piggyback:398.2] Out: 1760 [Urine:1750; Drains:5] Intake/Output this shift: Total I/O In: 269.3 [I.V.:141; IV Piggyback:128.3] Out: 250 [Urine:250]  General appearance: alert, cooperative and no distress Resp: Clear anterior she is avoiding the CPAP, but seems to be maintaining her sats GI: Soft, ostomy is working well good output.  Midline incision looks pretty good.  Continuing wet-to-dry dressings.  Lab Results:  Recent Labs    01/28/21 0246 01/29/21 0244  WBC 6.3 6.6  HGB 8.8* 9.6*  HCT 29.1* 31.9*  PLT 157 140*    BMET Recent Labs    01/28/21 0830 01/29/21 0244  NA 136 139  K 3.7 3.6  CL 109 108  CO2 23 22  GLUCOSE 92 73  BUN 56* 63*  CREATININE 1.87* 1.87*  CALCIUM 9.2 9.0   PT/INR No results for input(s): LABPROT, INR in the last 72 hours.  No results for input(s): AST, ALT, ALKPHOS, BILITOT, PROT, ALBUMIN in the last 168 hours.   Lipase     Component Value Date/Time   LIPASE 16 01/14/2021 1343     Medications: .  (feeding supplement) PROSource Plus  30 mL Oral BID BM  . allopurinol  100 mg Oral Daily  . brimonidine  1 drop Both Eyes BID  . chlorhexidine  15 mL Mouth Rinse BID  . Chlorhexidine Gluconate Cloth  6 each Topical Daily  . cycloSPORINE  1 drop Both Eyes BID  . dorzolamide  1 drop Both Eyes Daily  . feeding supplement  237 mL Oral BID BM  . FLUoxetine  40 mg Oral Daily  . furosemide  80 mg Intravenous BID  . hydrocortisone   Rectal BID  . insulin aspart  0-5 Units Subcutaneous QHS  . insulin aspart  0-9 Units Subcutaneous TID WC  . insulin glargine  5 Units Subcutaneous BID  . latanoprost  1 drop Both Eyes QHS  . mouth rinse  15 mL Mouth Rinse q12n4p  . metoprolol tartrate  25 mg Oral BID  . multivitamin with minerals  1 tablet Oral Daily  . mycophenolate  180 mg Oral BID  . nutrition supplement (JUVEN)  1 packet Oral BID BM  . predniSONE  7.5 mg Oral Q breakfast  . sodium chloride flush  3 mL Intravenous Q12H  . sodium chloride flush  5 mL Intracatheter Q8H  . tacrolimus  1 mg Oral BID  . vancomycin  125 mg Oral QID    Assessment/Plan Polycystic disease/ESRDwith renal transplant 2013-Prograf/prednisone. Elevated Cr- 2.19>>2.09>> 1.92(3/7)>>1.87 Hx CHF Chronic COPD/history  of tobacco use- On 2L PRN at home -Mostly off CPAP now. HxAge indeterminate, non occlusive deep vein thrombosis involving the right popliteal vein Hx SVT/Atrial flutter with RVR/TEE/DC cardioversion 12/25/20 Large hematoma left arm - drained and dressed- cleaning up hematoma still present Anemia- H/H 8.5/27.1>>7.3/24.1>> 8.8/29.1>>9.6/31.9 Severe protein calorie malnutrition-Total protein 6.0/albumin 1.9 Hyponatremia Hypercoagulable - INR 2.8 (on Eliquis) Severe malnutrition-prealbumin<5 C. difficile colitis -antigen/toxin + 01/21/2021, tx per medicine  Sepsis and 3.2 x 2.8 x 5.3 cm abdominal fluid/abscess collectionadjacent to enterotomy staple line HxPerforated sigmoid  diverticulitis -S/pExploratory laparotomy with sigmoid colectomy/end colostomy, 12/29/2020 Dr. Coralie Keens -IR drain placed 3/4, cx shows multiple organisms present, none predominant, no anaerobes isolated -cont local wound carewith mepitel over wound base and NS WD dressing changes BID.   FEN:D3 diet, supplements QF:JUVQQUIV/HOYWVX 2/28>> day 9, Vancomycin IV 2/28 - Vancomycin PO 3/1 >>day UCJ:[id number] gtt  Pain: Continue antibiotics, IR drain, and current dressing changes.    LOS: 9 days    Zacharey Jensen 01/29/2021 Please see Amion

## 2021-01-29 NOTE — Progress Notes (Signed)
Provided continued support around Advance Directive.   Advance Directive notarized.  Patient and daughter with original and two copies.  Copy placed in chart.  Copy scanned and sent to [name].

## 2021-01-29 NOTE — Progress Notes (Signed)
ANTICOAGULATION CONSULT NOTE - Follow Up Consult  Pharmacy Consult for Heparin Indication: atrial fibrillation  Allergies  Allergen Reactions  . Infed [Iron Dextran] Other (See Comments)    Chest tightness  . Pentamidine Itching, Shortness Of Breath and Swelling  . Budesonide-Formoterol Fumarate     Other reaction(s): hoarseness  . Bupropion     Other reaction(s): exacerbated depression  . Crestor [Rosuvastatin]     Other reaction(s): body aches, swelling  . Duloxetine Hcl     Other reaction(s): GI SE, weepy, worsening fatigue, foggy  . Erythromycin [Erythromycin] Other (See Comments)    Mouth Ulcers  . Iohexol Other (See Comments)    Per patient "she has had a kidney transplant and should never have contrast"  . Oxycodone Nausea Only and Nausea And Vomiting  . Pravastatin Sodium     Other reaction(s): myalgias  . Erythromycin Rash    Causes breakout in mouth  . Ultram [Tramadol Hcl] Anxiety    Patient Measurements: Height: 5\' 1"  (154.9 cm) Weight: 70.3 kg (154 lb 15.7 oz) IBW/kg (Calculated) : 47.8 Heparin Dosing Weight: 63kg  Vital Signs: Temp: 97.4 F (36.3 C) (03/09 0000) Temp Source: Oral (03/09 0000) BP: 143/107 (03/09 0600) Pulse Rate: 65 (03/09 0700)  Labs: Recent Labs    01/27/21 0338 01/28/21 0246 01/28/21 0830 01/29/21 0244  HGB 8.6* 8.8*  --  9.6*  HCT 28.0* 29.1*  --  31.9*  PLT 166 157  --  140*  HEPARINUNFRC 0.48 0.44  --  0.60  CREATININE 1.92*  --  1.87* 1.87*    Estimated Creatinine Clearance: 27.6 mL/min (A) (by C-G formula based on SCr of 1.87 mg/dL (H)).   Assessment: 89 yoF with PMH PAF s/p DCCV (12/25/20) on Eliquis, chronic resp fail (2L O2 baseline), CKD3 s/p renal txplant, dCHF, DM2, admitted after [facility] un[facility] Concern for infection after recent abdominal surgery; Pharmacy consulted to convert Eliquis to IV heparin while possible procedures pending. During last admission, heparin was especially difficult to  control, with rates ranging from 1650 units/hr down to 250 units/hr. Patient also noted to have significant bruising and hematomas on several extremities last admission. However, Cardiology has recommended at least 30 days of anticoagulation after DCCV (through 3/4).  PTA Eliquis 5mg  BID, last dose 2/28 09:00.  Baseline INR elevated; aPTT not done  3/4: s/p LLQ abscess drain.  Heparin off 0900-2130.  Resumed without bolus.   Today, 01/29/2021:   Heparin level 0.6 remains therapeutic on heparin 900 units/hr   CBC: Hgb 9.6 stable, Plt 140  Known bruising.  No bleeding reported.  No infusion problems.  Goal of Therapy:  Heparin level 0.3-0.7 units/ml aPTT 66-102 seconds Monitor platelets by anticoagulation protocol: Yes   Plan:   Continue heparin 900 units/hr   Daily CBC and heparin level  Monitor for s/sx of bleeding  Follow up plans for long-term oral anticoagulation.   Ulice Dash D  01/29/2021 8:29 AM

## 2021-01-29 NOTE — TOC Progression Note (Addendum)
Transition of Care [facility]) - Progression Note    Patient Details  Name: April [name] MRN: [id number] Date of Birth: Feb 17, 1956  Transition of Care [facility]) CM/SW Contact  Leeroy Cha, RN Phone Number: 01/29/2021, 9:31 AM  Clinical Narrative:    Jenell Milliner authorization# [id number] Dates [id number] through [phone] with new review due on [id number] Care coordination Artist D. At [phone] 1304/spoke with the daughter [name], wants to take mother home with palliative or hhc services.  Has needed equipment at home except for a hoyer lift. Has had hhc through [facility] and [place] being the last before this admission.  Will need RN,PT,OT, and aide.  Daughter does relate that someone can be with the patient 24/7.Does not patient to go back to [facility] or a snf.   essage sent to md with this information.  Expected Discharge Plan: Skilled Nursing Facility Barriers to Discharge: Continued Medical Work up  Expected Discharge Plan and Services Expected Discharge Plan: [place]   Discharge Planning Services: CM Consult Post Acute Care Choice: [place] Living arrangements for the past 2 months: Single Family Home                                       Social Determinants of Health (SDOH) Interventions    Readmission Risk Interventions Readmission Risk Prevention Plan 05/15/2020 04/19/2020 02/09/2020  Transportation Screening Complete Complete Complete  PCP or Specialist Appt within 3-5 Days - - -  Not Complete comments - - -  HRI or [place] or Home Care Consult comments - - -  Social Work Consult for [place] Planning/Counseling - - -  SW consult not completed comments - - -  Palliative Care Screening - - -  Medication Review ([place]) Referral to Pharmacy Complete Referral to Pharmacy  PCP or Specialist appointment within 3-5 days of discharge Complete Complete Complete  HRI or Home Care Consult  Complete Complete Complete  SW Recovery Care/Counseling Consult Complete Complete -  Palliative Care Screening Not Applicable Not Applicable Complete  Skilled Nursing Facility Complete Complete Patient Refused  Some recent data might be hidden

## 2021-01-29 NOTE — Progress Notes (Signed)
PROGRESS NOTE    April [name]  KZS:[id number] DOB: 06/05/1956 DOA: 01/12/2021 PCP: [name], MD   Chief Complaint  Patient presents with  . Hypotension  . hypoxia   Brief Narrative: 65 year old female with medical history of A flutter status post, chronic respiratory failure on 2 L oxygen, asthma, OSA, CKD stage IIIa, ESRD post transplant, diastolic heart failure, diabetes presented secondary to acute encephalopathy with associated hypotension in setting of intraabdominal fluid collection and recent intraabdominal surgery. General surgery and IR was consulted for management. Patient managed empirically on antibiotics. Found to have C. Difficile and started on Vancomycin PO.  Subjective: Seen and examined this morning. Nursing and daughter at the bedside. Afebrile overnight, creatinine stable at 1.8, no leukocytosis tor fever. Outout: 7322 [Urine:1750; Drains:5 Bp stable, on 2l [place], refused CPAP overnight Patient has been very weak not eating much complains of nausea, abdominal pain. She is alert awake oriented to month year place people daughter thinks she has gotten better but having issue with weakness and oral intake CBG low 65 this morning  Assessment & Plan:  Intra-abdominal fluid/abscess, adjacent to enterotomy staple line History of perforated sigmoid diverticulitis Status post ex lap with sigmoid colectomy/end colostomy 12/29/2020 by Dr. Ninfa Linden IR drain placement 2/4 and culture with multiple organisms-none predominant no anaerobes. Being managed by general surgery, IR with drain care wound care dressing change twice daily On cefepime, Flagyl day #9. Patient afebrile, no leukocytosis.  C. difficile colitis positive antigen/toxin 3/1: Mild symptoms, on oral vancomycin 01/21/21- course planned for 10 days  Large hematoma Left arm drained and dressed  Acute on chronic diastolic CHF Anasarca On IV Lasix 80 mg twice daily renal function tolerating Net IO Since  Admission: -2,265.82 mL [01/29/21 0744]  Polycystic kidney disease ESRD hx s/p renal transplant 2013 on immunosuppressant and chronic steroid AKI on CKD stage III: Renal function stable, continue Prograf, cellcept,and prednisone.  On diuresis-monitor renal function closely. Recent Labs  Lab 01/26/21 0145 01/26/21 1208 01/27/21 0338 01/28/21 0830 01/29/21 0244  BUN 65* 65* 64* 56* 63*  CREATININE 2.01* 2.12* 1.92* 1.87* 1.87*   History of adrenal insufficiency continue home prednisone 7.5 mg  Acute on chronic respiratory failure with hypoxia: Now on 2 L baseline home setting.  PT OT incentive spirometry. Acute metabolic encephalopathy: Resolved Hypotension received normal saline bolus and improved  Acute urine retention:?  Etiology Foley placed on admission.  Remains on Foley on IV Lasix plan for voiding trial prior to discharge.  Acute on chronic anemia Normocytic anemia Iron deficiency anemia in the setting of recent abdominal surgery left arm hematoma.  Hemoglobin stable/improved. Recent Labs  Lab 01/25/21 0523 01/26/21 0145 01/27/21 0338 01/28/21 0246 01/29/21 0244  HGB 8.5* 8.9* 8.6* 8.8* 9.6*  HCT 27.7* 28.0* 28.0* 29.1* 31.9*   Typical atrial flutter, TEE/cardioversion 2/6 and in sinus and has been on Eliquis, on IV heparin this admission, on metoprolol 25 mg twice daily.   T2DM: Blood sugar w/ a episode of hypoglycemia 65 with poor oral intake- I will change Lantus from 5 units twice daily to once a day and cont sliding scale insulin Recent Labs  Lab 01/27/21 2130 01/28/21 0744 01/28/21 1124 01/28/21 1555 01/28/21 2152  GLUCAP 131* 83 103* 025* 427*   Metabolic acidosis with low bicarb.  Resolved. Depression on Prozac Cont her allopurinol,  Diet Order            DIET DYS 3 Room service appropriate? Yes with Assist; Fluid consistency: Thin  Diet effective now                 Nutrition Problem: Increased nutrient needs Etiology: acute illness,wound  healing Signs/Symptoms: estimated needs Interventions: Juven,Magic cup,Ensure Enlive (each supplement provides 350kcal and 20 grams of protein),Prostat Patient's Body mass index is 29.28 kg/m.  DVT prophylaxis: Heprain iv Code Status:   Code Status: DNR Family Communication: plan of care discussed with patient AND HER DAUGHTER at bedside.  Status is: Inpatient Remains inpatient appropriate because:IV treatments appropriate due to intensity of illness or inability to take PO and Inpatient level of care appropriate due to severity of illness  Dispo: The patient is from: Home              Anticipated d/c is to: SNF in2-3 days ocne okay /w IR,CCS.              Patient currently is not medically stable to d/c.   Difficult to place patient No Unresulted Labs (From admission, onward)          Start     Ordered   01/28/21 1610  Basic metabolic panel  Daily,   R     Question:  Specimen collection method  Answer:  Lab=Lab collect   01/28/21 0719   01/26/21 0500  Heparin level (unfractionated)  Daily,   R     Question:  Specimen collection method  Answer:  Lab=Lab collect   01/25/21 0635   01/25/21 0500  CBC  Daily,   R     Question:  Specimen collection method  Answer:  Lab=Lab collect   01/24/21 1347         Medications reviewed:  Scheduled Meds: . (feeding supplement) PROSource Plus  30 mL Oral BID BM  . allopurinol  100 mg Oral Daily  . brimonidine  1 drop Both Eyes BID  . chlorhexidine  15 mL Mouth Rinse BID  . Chlorhexidine Gluconate Cloth  6 each Topical Daily  . cycloSPORINE  1 drop Both Eyes BID  . dorzolamide  1 drop Both Eyes Daily  . feeding supplement  237 mL Oral BID BM  . FLUoxetine  40 mg Oral Daily  . furosemide  80 mg Intravenous BID  . hydrocortisone   Rectal BID  . insulin aspart  0-5 Units Subcutaneous QHS  . insulin aspart  0-9 Units Subcutaneous TID WC  . insulin glargine  5 Units Subcutaneous BID  . latanoprost  1 drop Both Eyes QHS  . mouth rinse  15  mL Mouth Rinse q12n4p  . metoprolol tartrate  25 mg Oral BID  . multivitamin with minerals  1 tablet Oral Daily  . mycophenolate  180 mg Oral BID  . nutrition supplement (JUVEN)  1 packet Oral BID BM  . predniSONE  7.5 mg Oral Q breakfast  . sodium chloride flush  3 mL Intravenous Q12H  . sodium chloride flush  5 mL Intracatheter Q8H  . tacrolimus  1 mg Oral BID  . vancomycin  125 mg Oral QID   Continuous Infusions: . sodium chloride 10 mL/hr at 01/28/21 0832  . sodium chloride 10 mL/hr at 01/29/21 0206  . ceFEPime (MAXIPIME) IV Stopped (01/28/21 1747)  . heparin 900 Units/hr (01/29/21 0647)  . metronidazole Stopped (01/29/21 0109)    Consultants:see note  Procedures:see note  Antimicrobials: Anti-infectives (From admission, onward)   Start     Dose/Rate Route Frequency Ordered Stop   01/21/21 2200  vancomycin (VANCOCIN) 50 mg/mL oral solution 125  mg        125 mg Oral 4 times daily 01/21/21 2028 01/31/21 2159   01/21/21 1800  ceFEPIme (MAXIPIME) 2 g in sodium chloride 0.9 % 100 mL IVPB        2 g 200 mL/hr over 30 Minutes Intravenous Every 24 hours 01/16/2021 1742     01/21/21 0000  metroNIDAZOLE (FLAGYL) IVPB 500 mg        500 mg 100 mL/hr over 60 Minutes Intravenous Every 8 hours 12/24/2020 1821     01/18/2021 1800  vancomycin (VANCOCIN) IVPB 1000 mg/200 mL premix  Status:  Discontinued        1,000 mg 200 mL/hr over 60 Minutes Intravenous Every 48 hours 01/09/2021 1742 01/22/21 1053   12/28/2020 1545  ceFEPIme (MAXIPIME) 2 g in sodium chloride 0.9 % 100 mL IVPB        2 g 200 mL/hr over 30 Minutes Intravenous  Once 01/11/2021 1544 01/08/2021 1625   01/03/2021 1545  metroNIDAZOLE (FLAGYL) IVPB 500 mg        500 mg 100 mL/hr over 60 Minutes Intravenous  Once 01/04/2021 1544 01/03/2021 1730   12/27/2020 1545  vancomycin (VANCOCIN) IVPB 1000 mg/200 mL premix  Status:  Discontinued        1,000 mg 200 mL/hr over 60 Minutes Intravenous  Once 01/02/2021 1544 01/06/2021 1847      Culture/Microbiology    Component Value Date/Time   SDES  01/24/2021 1308    ABSCESS Performed at [facility], [place] [address], [place], [place] [address]    SPECREQUEST  01/24/2021 1308    NONE Performed at [facility], [place] [address] [place], [place] [address]    CULT (A) 01/24/2021 1308    MULTIPLE ORGANISMS PRESENT, NONE PREDOMINANT NO ANAEROBES ISOLATED; CULTURE IN PROGRESS FOR 5 DAYS    REPTSTATUS PENDING 01/24/2021 1308    Other culture-see note  Objective: Vitals: Today's Vitals   01/29/21 0400 01/29/21 0500 01/29/21 0600 01/29/21 0700  BP: 139/60  (!) 143/107   Pulse: (!) 55 73 67 65  Resp: (!) 21 13 16 18   Temp:      TempSrc:      SpO2: 100% 100% 100% 100%  Weight:  70.3 kg    Height:      PainSc:        Intake/Output Summary (Last 24 hours) at 01/29/2021 0726 Last data filed at 01/29/2021 0206 Gross per 24 hour  Intake 937.01 ml  Output 1760 ml  Net -822.99 ml   Filed Weights   01/27/21 1645 01/28/21 0500 01/29/21 0500  Weight: 73.3 kg 73.3 kg 70.3 kg   Weight change: -3 kg  Intake/Output from previous day: 03/08 0701 - 03/09 0700 In: 937 [P.O.:120; I.V.:418.8; IV Piggyback:398.2] Out: 1760 [Urine:1750; Drains:5] Intake/Output this shift: No intake/output data recorded. Filed Weights   01/27/21 1645 01/28/21 0500 01/29/21 0500  Weight: 73.3 kg 73.3 kg 70.3 kg    Examination: General exam: AAOx3, WEAK ,FRAIL ON 2L [place] HEENT:Oral mucosa moist, Ear/Nose WNL grossly,dentition normal. Respiratory system: bilaterally diminished,no use of accessory muscle, non tender. Cardiovascular system: S1 & S2 +, regular, No JVD. Gastrointestinal system: Abdomen soft, RLQ large wound with clean base- no draiange, dressing removed by surgery, LLQ colostomy+ w/ liquidy small volume stool, BS+. Nervous System:Alert, awake, moving extremities and grossly nonfocal Extremities: b/l leg edema mild , distal peripheral pulses  palpable.  Skin: No rashes,no icterus. MSK: Normal muscle bulk,tone, power  Data Reviewed:  I have personally reviewed following labs and imaging studies CBC: Recent Labs  Lab 01/25/21 0523 01/26/21 0145 01/27/21 0338 01/28/21 0246 01/29/21 0244  WBC 4.5 8.5 6.2 6.3 6.6  HGB 8.5* 8.9* 8.6* 8.8* 9.6*  HCT 27.7* 28.0* 28.0* 29.1* 31.9*  MCV 101.5* 100.0 104.1* 105.1* 105.6*  PLT 158 223 166 157 324*   Basic Metabolic Panel: Recent Labs  Lab 01/26/21 0145 01/26/21 1208 01/27/21 0338 01/28/21 0830 01/29/21 0244  NA 133* 134* 133* 136 139  K 3.1* 3.3* 3.5 3.7 3.6  CL 105 103 106 109 108  CO2 19* 20* 22 23 22   GLUCOSE 214* 200* 183* 92 73  BUN 65* 65* 64* 56* 63*  CREATININE 2.01* 2.12* 1.92* 1.87* 1.87*  CALCIUM 8.9 8.9 8.9 9.2 9.0   GFR: Estimated Creatinine Clearance: 27.6 mL/min (A) (by C-G formula based on SCr of 1.87 mg/dL (H)). Liver Function Tests: No results for input(s): AST, ALT, ALKPHOS, BILITOT, PROT, ALBUMIN in the last 168 hours. No results for input(s): LIPASE, AMYLASE in the last 168 hours. No results for input(s): AMMONIA in the last 168 hours. Coagulation Profile: Recent Labs  Lab 01/23/21 0248 01/24/21 0252  INR 1.6* 1.9*   Cardiac Enzymes: No results for input(s): CKTOTAL, CKMB, CKMBINDEX, TROPONINI in the last 168 hours. BNP (last 3 results) No results for input(s): PROBNP in the last 8760 hours. HbA1C: No results for input(s): HGBA1C in the last 72 hours. CBG: Recent Labs  Lab 01/27/21 2130 01/28/21 0744 01/28/21 1124 01/28/21 1555 01/28/21 2152  GLUCAP 131* 83 103* 137* 110*   Lipid Profile: No results for input(s): CHOL, HDL, LDLCALC, TRIG, CHOLHDL, LDLDIRECT in the last 72 hours. Thyroid Function Tests: No results for input(s): TSH, T4TOTAL, FREET4, T3FREE, THYROIDAB in the last 72 hours. Anemia Panel: No results for input(s): VITAMINB12, FOLATE, FERRITIN, TIBC, IRON, RETICCTPCT in the last 72 hours. Sepsis Labs: No results  for input(s): PROCALCITON, LATICACIDVEN in the last 168 hours.  Recent Results (from the past 240 hour(s))  Resp Panel by RT-PCR (Flu A&B, Covid) Nasopharyngeal Swab     Status: None   Collection Time: 12/29/2020  2:05 PM   Specimen: Nasopharyngeal Swab; Nasopharyngeal(NP) swabs in vial transport medium  Result Value Ref Range Status   SARS Coronavirus 2 by RT PCR NEGATIVE NEGATIVE Final    Comment: (NOTE) SARS-CoV-2 target nucleic acids are NOT DETECTED.  The SARS-CoV-2 RNA is generally detectable in upper respiratory specimens during the acute phase of infection. The lowest concentration of SARS-CoV-2 viral copies this assay can detect is 138 copies/mL. A negative result does not preclude SARS-Cov-2 infection and should not be used as the sole basis for treatment or other patient management decisions. A negative result may occur with  improper specimen collection/handling, submission of specimen other than nasopharyngeal swab, presence of viral mutation(s) within the areas targeted by this assay, and inadequate number of viral copies(<138 copies/mL). A negative result must be combined with clinical observations, patient history, and epidemiological information. The expected result is Negative.  Fact Sheet for Patients:  EntrepreneurPulse.com.au  Fact Sheet for Healthcare Providers:  IncredibleEmployment.be  This test is no t yet approved or cleared by the [place] FDA and  has been authorized for detection and/or diagnosis of SARS-CoV-2 by FDA under an Emergency Use Authorization (EUA). This EUA will remain  in effect (meaning this test can be used) for the duration of the COVID-19 declaration under Section 564(b)(1) of the Act, 21 U.S.C.section 360bbb-3(b)(1), unless the authorization is  terminated  or revoked sooner.       Influenza A by PCR NEGATIVE NEGATIVE Final   Influenza B by PCR NEGATIVE NEGATIVE Final    Comment: (NOTE) The  Xpert Xpress SARS-CoV-2/FLU/RSV plus assay is intended as an aid in the diagnosis of influenza from Nasopharyngeal swab specimens and should not be used as a sole basis for treatment. Nasal washings and aspirates are unacceptable for Xpert Xpress SARS-CoV-2/FLU/RSV testing.  Fact Sheet for Patients: EntrepreneurPulse.com.au  Fact Sheet for Healthcare Providers: IncredibleEmployment.be  This test is not yet approved or cleared by the [place] FDA and has been authorized for detection and/or diagnosis of SARS-CoV-2 by FDA under an Emergency Use Authorization (EUA). This EUA will remain in effect (meaning this test can be used) for the duration of the COVID-19 declaration under Section 564(b)(1) of the Act, 21 U.S.C. section 360bbb-3(b)(1), unless the authorization is terminated or revoked.  Performed at [facility], [place] [address] [place], [place] [address]   Culture, blood (Routine x 2)     Status: None   Collection Time: 01/03/2021  3:28 PM   Specimen: BLOOD  Result Value Ref Range Status   Specimen Description   Final    BLOOD RIGHT ARM Performed at [place] [address], [place], [place] [address]    Special Requests   Final    BOTTLES DRAWN AEROBIC AND ANAEROBIC Blood Culture adequate volume Performed at [place] [address] [place], [place] [address]    Culture   Final    NO GROWTH 5 DAYS Performed at [facility], [place] [address] [place], [place] [address]    Report Status 01/25/2021 FINAL  Final  MRSA PCR Screening     Status: None   Collection Time: 01/02/2021  6:20 PM   Specimen: Nasopharyngeal  Result Value Ref Range Status   MRSA by PCR NEGATIVE NEGATIVE Final    Comment:        The GeneXpert MRSA Assay (FDA approved for NASAL specimens only), is one component of a comprehensive MRSA colonization surveillance program. It is not intended to  diagnose MRSA infection nor to guide or monitor treatment for MRSA infections. Performed at [facility], [place] [address], [place], [place] [address]   Urine culture     Status: None   Collection Time: 01/17/2021  6:21 PM   Specimen: Urine, Random  Result Value Ref Range Status   Specimen Description   Final    URINE, RANDOM Performed at [place] [address], [place], [place] [address]    Special Requests   Final    NONE Performed at [facility], [place] [address], [place], [place] [address]    Culture   Final    NO GROWTH Performed at [facility], [place] [address] [place], [place] [address]    Report Status 01/22/2021 FINAL  Final  C Difficile Quick Screen w PCR reflex     Status: Abnormal   Collection Time: 01/21/21  3:43 PM   Specimen: STOOL  Result Value Ref Range Status   C Diff antigen POSITIVE (A) NEGATIVE Final   C Diff toxin POSITIVE (A) NEGATIVE Final   C Diff interpretation Toxin producing C. difficile detected.  Final    Comment: CRITICAL RESULT CALLED TO, READ BACK BY AND VERIFIED WITH: P.MERCADO AT 2017 ON 03.01.22 BY N.THOMPSON Performed at [facility], [place] [address]  [place], [place] [address]   Aerobic/Anaerobic Culture (surgical/deep wound)     Status: Abnormal (Preliminary result)   Collection Time: 01/24/21  1:08 PM   Specimen: Abscess  Result Value Ref Range Status   Specimen Description   Final    ABSCESS Performed at [place] [address], [place], [place] [address]    Special Requests   Final    NONE Performed at [facility], [place] [address], [place], [place] [address]    Gram Stain   Final    MODERATE WBC PRESENT, PREDOMINANTLY PMN MODERATE GRAM POSITIVE COCCI IN PAIRS IN CLUSTERS FEW GRAM POSITIVE RODS RARE GRAM NEGATIVE RODS Performed at [facility], [place] [address] [place], [place] [address]     Culture (A)  Final    MULTIPLE ORGANISMS PRESENT, NONE PREDOMINANT NO ANAEROBES ISOLATED; CULTURE IN PROGRESS FOR 5 DAYS    Report Status PENDING  Incomplete     Radiology Studies: No results found.   LOS: 9 days   Antonieta Pert, MD [facility]  01/29/2021, 7:26 AM

## 2021-01-29 NOTE — Progress Notes (Signed)
SLP Cancellation Note  Patient Details Name: April [name] MRN: [id number] DOB: 1956-08-28   Cancelled treatment:        Attempted to see pt for cognitive evaluation.  Pt had recently received pain medication and was very lethargic and was unable to participate in assessment.  SLP will reattempt as schedule permits.    Celedonio Savage, MA, [place] Office: [phone] 01/29/2021, 10:44 AM

## 2021-01-29 NOTE — NC FL2 (Signed)
[place] LEVEL OF CARE SCREENING TOOL     IDENTIFICATION  Patient Name: April [name] Birthdate: 12/08/1955 Sex: female Admission Date (Current Location): 01/19/2021  [facility] and [place] Number:  Herbalist and Address:  [facility],  [place] [address] [place]      Provider Number: [id number]  Attending Physician Name and Address:  Antonieta Pert, MD  Relative Name and Phone Number:  G[name] Daughter [phone]  [phone]    Current Level of Care: Hospital Recommended Level of Care: [place] Prior Approval Number:    Date Approved/Denied:   PASRR Number: [id number]  Discharge Plan: SNF    Current Diagnoses: Patient Active Problem List   Diagnosis Date Noted  . Hypotension 12/31/2020  . Acute metabolic encephalopathy 56/31/4970  . Difficult intravenous access   . Bilious vomiting with nausea   . Sigmoid diverticulitis 12/16/2020  . Acute CHF (congestive heart failure) ([name]) 12/16/2020  . Fever of unknown origin 10/24/2020  . Fever of unknown origin (FUO) 10/23/2020  . Lower GI bleeding 09/25/2020  . Diarrhea 09/10/2020  . Acute renal failure superimposed on stage 3b chronic kidney disease ([place]) 09/08/2020  . Acute diverticulitis 09/04/2020  . SIRS (systemic inflammatory response syndrome) ([place]) 07/02/2020  . Chronic kidney disease, stage 3a ([place])   . Kappa light chain disease ([place]) 06/19/2020  . History of immunosuppressive therapy 05/27/2020  . FUO (fever of unknown origin) 05/15/2020  . Immunocompromised state ([place])   . Decubitus ulcer of coccyx, unstageable ([place]) 05/01/2020  . Pericardial effusion 04/16/2020  . Chronic respiratory failure with hypoxia ([place]) 04/16/2020  . Proteinuria 04/16/2020  . Pressure injury of skin of buttock 04/08/2020  . COPD (chronic obstructive pulmonary disease) ([place]) 04/07/2020  . SVT (supraventricular tachycardia) ([place])   . Anemia of chronic disease   .  Palliative care encounter   . Goals of care, counseling/discussion   . Essential hypertension 02/08/2020  . Glaucoma 02/08/2020  . Sepsis ([place]) 01/31/2020  . SOB (shortness of breath)   . Fracture of left superior pubic ramus (HCC)   . Pain   . Anxiety state   . Left displaced femoral neck fracture ([place]) 12/15/2019  . Status post total hip replacement, left   . Post-op pain   . Polycystic kidney   . Closed left hip fracture ([place]) 12/07/2019  . Left wrist fracture 12/07/2019  . Medication management 09/22/2019  . Physical deconditioning 09/22/2019  . Acute respiratory failure with hypoxia ([place]) 04/19/2019  . Acute on chronic diastolic CHF (congestive heart failure) ([place]) 04/19/2019  . Bilateral closed proximal tibial fracture 12/21/2018  . Asthma, chronic, unspecified asthma severity, with acute exacerbation 10/03/2018  . Chronic diastolic CHF (congestive heart failure) ([place]) 10/01/2017  . Asthma, mild intermittent 07/15/2016  . GERD without esophagitis 07/15/2016  . Renal transplant recipient 07/15/2016  . Hyperlipidemia 07/15/2016  . Depression 07/15/2016  . Bronchitis, mucopurulent recurrent ([place]) 08/08/2014  . Chronic cough 07/24/2014  . End stage renal disease ([place]) 03/23/2012  . 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, implant, and graft 03/23/2012    Orientation RESPIRATION BLADDER Height & Weight     Self,Time,Situation,Place  O2 Incontinent Weight: 70.3 kg Height:  5\' 1"  (154.9 cm)  BEHAVIORAL SYMPTOMS/MOOD NEUROLOGICAL BOWEL NUTRITION STATUS      Colostomy Diet (regular)  AMBULATORY STATUS COMMUNICATION OF NEEDS Skin   Extensive Assist Verbally PU Stage and Appropriate Care  Personal Care Assistance Level of Assistance  Bathing,Dressing,Feeding Bathing Assistance: Limited assistance Feeding assistance: Limited assistance Dressing Assistance: Limited assistance     Functional Limitations Info  Sight,Hearing,Speech Sight  Info: Adequate Hearing Info: Adequate Speech Info: Adequate    SPECIAL CARE FACTORS FREQUENCY  PT (By licensed PT),OT (By licensed OT)     PT Frequency: 5 x weekly OT Frequency: 5 x weekly            Contractures Contractures Info: Not present    Additional Factors Info  Code Status,Allergies,Insulin Sliding Scale,Isolation Precautions Code Status Info: full code Allergies Info: Infed, Pentamidine Budesonide-formoterol Fumarate Bupropion Crestor, Duloxetine Hcl Erythromycin, Iohexol Oxycodone Pravastatin Sodium Erythromycin Ultram           Current Medications (01/29/2021):  This is the current hospital active medication list Current Facility-Administered Medications  Medication Dose Route Frequency Provider Last Rate Last Admin  . (feeding supplement) PROSource Plus liquid 30 mL  30 mL Oral BID BM Dessa Phi, DO   30 mL at 01/28/21 2124  . 0.9 %  sodium chloride infusion   Intravenous PRN Mariel Aloe, MD 10 mL/hr at 01/28/21 4174 Infusion Verify at 01/28/21 0832  . 0.9 %  sodium chloride infusion   Intravenous PRN Mariel Aloe, MD 10 mL/hr at 01/29/21 0206 Infusion Verify at 01/29/21 0206  . acetaminophen (TYLENOL) tablet 650 mg  650 mg Oral Q6H PRN Dessa Phi, DO   650 mg at 01/24/21 2158   Or  . acetaminophen (TYLENOL) suppository 650 mg  650 mg Rectal Q6H PRN Dessa Phi, DO      . allopurinol (ZYLOPRIM) tablet 100 mg  100 mg Oral Daily Dessa Phi, DO   100 mg at 01/28/21 1118  . alum & mag hydroxide-simeth (MAALOX/MYLANTA) 200-200-20 MG/5ML suspension 15 mL  15 mL Oral Q4H PRN Mariel Aloe, MD   15 mL at 01/26/21 1306  . brimonidine (ALPHAGAN) 0.15 % ophthalmic solution 1 drop  1 drop Both Eyes BID Dessa Phi, DO   1 drop at 01/28/21 2132  . ceFEPIme (MAXIPIME) 2 g in sodium chloride 0.9 % 100 mL IVPB  2 g Intravenous Q24H Adrian Saran, [facility]   Stopped at 01/28/21 1747  . chlorhexidine (PERIDEX) 0.12 % solution 15 mL  15 mL Mouth Rinse BID  Mariel Aloe, MD   15 mL at 01/28/21 2136  . Chlorhexidine Gluconate Cloth 2 % PADS 6 each  6 each Topical Daily Dessa Phi, DO   6 each at 01/28/21 1121  . cycloSPORINE (RESTASIS) 0.05 % ophthalmic emulsion 1 drop  1 drop Both Eyes BID Dessa Phi, DO   1 drop at 01/28/21 2133  . dorzolamide (TRUSOPT) 2 % ophthalmic solution 1 drop  1 drop Both Eyes Daily Dessa Phi, DO   1 drop at 01/28/21 1122  . feeding supplement (ENSURE ENLIVE / ENSURE PLUS) liquid 237 mL  237 mL Oral BID BM Dessa Phi, DO   237 mL at 01/26/21 1604  . FLUoxetine (PROZAC) capsule 40 mg  40 mg Oral Daily Dessa Phi, DO   40 mg at 01/28/21 1118  . furosemide (LASIX) injection 80 mg  80 mg Intravenous BID Mariel Aloe, MD   80 mg at 01/28/21 1717  . heparin ADULT infusion 100 units/mL (25000 units/2103mL)  900 Units/hr Intravenous Continuous Thomes Lolling, RPH 9 mL/hr at 01/29/21 0647 900 Units/hr at 01/29/21 0647  . hydrocortisone (ANUSOL-HC) 2.5 % rectal cream   Rectal BID Nettey,  Evalee Jefferson, MD   Given at 01/28/21 2136  . insulin aspart (novoLOG) injection 0-5 Units  0-5 Units Subcutaneous QHS Dessa Phi, DO   3 Units at 01/25/21 2308  . insulin aspart (novoLOG) injection 0-9 Units  0-9 Units Subcutaneous TID WC Dessa Phi, DO   1 Units at 01/28/21 1725  . insulin glargine (LANTUS) injection 5 Units  5 Units Subcutaneous BID Dessa Phi, DO   5 Units at 01/28/21 2124  . latanoprost (XALATAN) 0.005 % ophthalmic solution 1 drop  1 drop Both Eyes QHS Dessa Phi, DO   1 drop at 01/28/21 2132  . MEDLINE mouth rinse  15 mL Mouth Rinse q12n4p Mariel Aloe, MD   15 mL at 01/28/21 1725  . metoprolol tartrate (LOPRESSOR) tablet 25 mg  25 mg Oral BID Mariel Aloe, MD   25 mg at 01/28/21 1118  . metroNIDAZOLE (FLAGYL) IVPB 500 mg  500 mg Intravenous Q8H Dessa Phi, DO   Stopped at 01/29/21 0109  . multivitamin with minerals tablet 1 tablet  1 tablet Oral Daily Dessa Phi, DO   1 tablet  at 01/28/21 1118  . mycophenolate (MYFORTIC) EC tablet 180 mg  180 mg Oral BID Dessa Phi, DO   180 mg at 01/28/21 2129  . nutrition supplement (JUVEN) (JUVEN) powder packet 1 packet  1 packet Oral BID BM Dessa Phi, DO   1 packet at 01/28/21 [phone]  . ondansetron (ZOFRAN) tablet 4 mg  4 mg Oral Q6H PRN Dessa Phi, DO       Or  . ondansetron [facility]) injection 4 mg  4 mg Intravenous Q6H PRN Dessa Phi, DO   4 mg at 01/27/21 0921  . predniSONE (DELTASONE) tablet 7.5 mg  7.5 mg Oral Q breakfast Mariel Aloe, MD   7.5 mg at 01/28/21 0836  . promethazine (PHENERGAN) injection 12.5 mg  12.5 mg Intravenous Q6H PRN Mariel Aloe, MD   12.5 mg at 01/28/21 2153  . sodium chloride flush (NS) 0.9 % injection 3 mL  3 mL Intravenous Q12H Dessa Phi, DO   3 mL at 01/28/21 2131  . sodium chloride flush (NS) 0.9 % injection 5 mL  5 mL Intracatheter Q8H Mir, Paula Libra, MD   5 mL at 01/29/21 0630  . tacrolimus (PROGRAF) capsule 1 mg  1 mg Oral BID Dessa Phi, DO   1 mg at 01/28/21 2129  . vancomycin (VANCOCIN) 50 mg/mL oral solution 125 mg  125 mg Oral QID Vianne Bulls, MD   125 mg at 01/28/21 2128     Discharge Medications: Please see discharge summary for a list of discharge medications.  Relevant Imaging Results:  Relevant Lab Results:   Additional Information SSN [id number]  Leeroy Cha, RN

## 2021-01-29 NOTE — Progress Notes (Signed)
Pt declined nocturnal cpap again tonight.  Machine remains in room on standby as needed.  RN aware.

## 2021-01-30 DIAGNOSIS — K651 Peritoneal abscess: Secondary | ICD-10-CM | POA: Diagnosis not present

## 2021-01-30 DIAGNOSIS — Z7189 Other specified counseling: Secondary | ICD-10-CM | POA: Diagnosis not present

## 2021-01-30 DIAGNOSIS — G9341 Metabolic encephalopathy: Secondary | ICD-10-CM | POA: Diagnosis not present

## 2021-01-30 DIAGNOSIS — Z940 Kidney transplant status: Secondary | ICD-10-CM | POA: Diagnosis not present

## 2021-01-30 LAB — BASIC METABOLIC PANEL
Anion gap: 12 (ref 5–15)
BUN: 62 mg/dL — ABNORMAL HIGH (ref 8–23)
CO2: 20 mmol/L — ABNORMAL LOW (ref 22–32)
Calcium: 8.8 mg/dL — ABNORMAL LOW (ref 8.9–10.3)
Chloride: 107 mmol/L (ref 98–111)
Creatinine, Ser: 1.84 mg/dL — ABNORMAL HIGH (ref 0.44–1.00)
GFR, Estimated: 30 mL/min — ABNORMAL LOW (ref >60)
Glucose, Bld: 106 mg/dL — ABNORMAL HIGH (ref 70–99)
Potassium: 3.6 mmol/L (ref 3.5–5.1)
Sodium: 139 mmol/L (ref 135–145)

## 2021-01-30 LAB — AEROBIC/ANAEROBIC CULTURE W GRAM STAIN (SURGICAL/DEEP WOUND)

## 2021-01-30 LAB — GLUCOSE, CAPILLARY
Glucose-Capillary: 90 mg/dL (ref 70–99)
Glucose-Capillary: 149 mg/dL — ABNORMAL HIGH (ref 70–99)
Glucose-Capillary: 173 mg/dL — ABNORMAL HIGH (ref 70–99)
Glucose-Capillary: 154 mg/dL — ABNORMAL HIGH (ref 70–99)
Glucose-Capillary: 123 mg/dL — ABNORMAL HIGH (ref 70–99)

## 2021-01-30 LAB — HEPARIN LEVEL (UNFRACTIONATED): Heparin Unfractionated: 0.56 IU/mL (ref 0.30–0.70)

## 2021-01-30 LAB — CBC
HCT: 29.8 % — ABNORMAL LOW (ref 36.0–46.0)
Hemoglobin: 8.9 g/dL — ABNORMAL LOW (ref 12.0–15.0)
MCH: 31.9 pg (ref 26.0–34.0)
MCHC: 29.9 g/dL — ABNORMAL LOW (ref 30.0–36.0)
MCV: 106.8 fL — ABNORMAL HIGH (ref 80.0–100.0)
Platelets: 128 10*3/uL — ABNORMAL LOW (ref 150–400)
RBC: 2.79 MIL/uL — ABNORMAL LOW (ref 3.87–5.11)
RDW: 22.6 % — ABNORMAL HIGH (ref 11.5–15.5)
WBC: 6.9 10*3/uL (ref 4.0–10.5)
nRBC: 0 % (ref 0.0–0.2)

## 2021-01-30 NOTE — Progress Notes (Signed)
Patient transferred out of ICU/SD today without a CPAP machine. Mask and tubing remain in the room for future use if she should decide to try positive pressure again. At this time, she continues to refuse more attempts at nocturnal use and states "I really can't tolerate that thing".

## 2021-01-30 NOTE — Progress Notes (Signed)
Physical Therapy Treatment Patient Details Name: April [name] MRN: [id number] DOB: 07-05-1956 Today's Date: 01/30/2021    History of Present Illness April [name] is a 65 y.o. female admitted on 01/07/2021 secondary to acute encephalopathy with associated hypotension in setting of intraabdominal fluid collection and recent intraabdominal surgery. General surgery and IR consulted for management.  Found to have C. Difficile.  Noted pt's with open abdominal wound. Recently in [facility] for new onset afib from 12/16/20-01/14/21 , DC to SNF. H/O Left hip and wrist fx 11/2019.  Pt with several recent admissions - review PT notes and PATIENT WAS AMBULATORY prior to 01/02/21.  PMH of atrial flutter s/p DCCV, chronic respiratory failure on 2 lpm of oxygen, asthma, possible OSA, CKD stage IIIa, ESRD s/p renal transplant, diastolic heart failure, diabetes, Raynaud's phenomenon.    PT Comments    The patient is awake  Remains very frail, requiring total assistance for bed mobility, sitting on bed edge. Patient sat x 10 ", initially able to support self in sitting. Gradually required increased support. Continue PT for mobility progression as tolerated.    Follow Up Recommendations  SNF vs Home     Equipment Recommendations  Wheelchair (measurements PT);Wheelchair cushion (measurements PT);3in1 (PT);Hospital bed , mechanical lift if Dc home   Recommendations for Other Services       Precautions / Restrictions Precautions Precautions: Fall Precaution Comments: colostomy, open abd wound, left drain    Mobility  Bed Mobility Overal bed mobility: Needs Assistance Bed Mobility: Supine to Sit;Sit to Supine     Supine to sit: Total assist;+2 for physical assistance;+2 for safety/equipment Sit to supine: Total assist;+2 for physical assistance;+2 for safety/equipment   General bed mobility comments: Patient requiring 2 total assistance to sit up onto EOb, use of bed pad  to scoot around. Same to return to  supine. patient very frail    Transfers                 General transfer comment: will require mechanical lift  Ambulation/Gait                 Stairs             Wheelchair Mobility    Modified Rankin (Stroke Patients Only)       Balance Overall balance assessment: Needs assistance Sitting-balance support: Feet supported;Bilateral upper extremity supported Sitting balance-Leahy Scale: Poor Sitting balance - Comments: Sat EOB for at least 11 mins with min guard -max, appeared to fatigue.  Performed ADLs with OT and LE exercises.                                    Cognition Arousal/Alertness: Awake/alert Behavior During Therapy: Flat affect Overall Cognitive Status: No family/caregiver present to determine baseline cognitive functioning                                 General Comments: Pt with flat affect but did follow all commands and carried on appropriate conversations      Exercises General Exercises - Lower Extremity Ankle Circles/Pumps: AROM;Both;Supine;10 reps Long Arc Quad: AROM;Seated;Both;5 reps    General Comments        Pertinent Vitals/Pain Faces Pain Scale: Hurts even more Pain Location: buttocks, back Pain Descriptors / Indicators: Grimacing;Discomfort    Home Living  Prior Function            PT Goals (current goals can now be found in the care plan section) Progress towards PT goals: Not progressing toward goals - comment (remains very frail and weak.)    Frequency    Min 2X/week      PT Plan Current plan remains appropriate    Co-evaluation PT/OT/SLP Co-Evaluation/Treatment: Yes Reason for Co-Treatment: For patient/therapist safety PT goals addressed during session: Mobility/safety with mobility OT goals addressed during session: ADL's and self-care      AM-PAC PT "6 Clicks" Mobility   Outcome Measure  Help needed turning from your back to your  side while in a flat bed without using bedrails?: Total Help needed moving from lying on your back to sitting on the side of a flat bed without using bedrails?: Total Help needed moving to and from a bed to a chair (including a wheelchair)?: Total Help needed standing up from a chair using your arms (e.g., wheelchair or bedside chair)?: Total Help needed to walk in hospital room?: Total Help needed climbing 3-5 steps with a railing? : Total 6 Click Score: 6    End of Session Equipment Utilized During Treatment:  Activity Tolerance: Patient limited by fatigue Patient left: in bed;with call bell/phone within reach;with bed alarm set Nurse Communication: Mobility status;Need for lift equipment PT Visit Diagnosis: Muscle weakness (generalized) (M62.81);Pain;Unsteadiness on feet (R26.81)     Time: 9201-0071 PT Time Calculation (min) (ACUTE ONLY): 23 min  Charges:  $Therapeutic Activity: 8-22 mins                     April [name] PT Acute Rehabilitation Services Pager [phone] Office [phone]    April [name] 01/30/2021, 12:16 PM

## 2021-01-30 NOTE — Progress Notes (Signed)
ANTICOAGULATION CONSULT NOTE - Follow Up Consult  Pharmacy Consult for Heparin Indication: atrial fibrillation  Allergies  Allergen Reactions  . Infed [Iron Dextran] Other (See Comments)    Chest tightness  . Pentamidine Itching, Shortness Of Breath and Swelling  . Budesonide-Formoterol Fumarate     Other reaction(s): hoarseness  . Bupropion     Other reaction(s): exacerbated depression  . Crestor [Rosuvastatin]     Other reaction(s): body aches, swelling  . Duloxetine Hcl     Other reaction(s): GI SE, weepy, worsening fatigue, foggy  . Erythromycin [Erythromycin] Other (See Comments)    Mouth Ulcers  . Iohexol Other (See Comments)    Per patient "she has had a kidney transplant and should never have contrast"  . Oxycodone Nausea Only and Nausea And Vomiting  . Pravastatin Sodium     Other reaction(s): myalgias  . Erythromycin Rash    Causes breakout in mouth  . Ultram [Tramadol Hcl] Anxiety    Patient Measurements: Height: 5\' 1"  (154.9 cm) Weight: 72 kg (158 lb 11.7 oz) IBW/kg (Calculated) : 47.8 Heparin Dosing Weight: 63kg  Vital Signs: Temp: 97.7 F (36.5 C) (03/10 0400) Temp Source: Oral (03/10 0400) BP: 112/52 (03/10 0600) Pulse Rate: 68 (03/10 0600)  Labs: Recent Labs    01/28/21 0246 01/28/21 0830 01/29/21 0244 01/30/21 0248  HGB 8.8*  --  9.6* 8.9*  HCT 29.1*  --  31.9* 29.8*  PLT 157  --  140* 128*  HEPARINUNFRC 0.44  --  0.60 0.56  CREATININE  --  1.87* 1.87* 1.84*    Estimated Creatinine Clearance: 28.4 mL/min (A) (by C-G formula based on SCr of 1.84 mg/dL (H)).   Assessment: 50 yoF with PMH PAF s/p DCCV (12/25/20) on Eliquis, chronic resp fail (2L O2 baseline), CKD3 s/p renal txplant, dCHF, DM2, admitted after being found unresponsive at [facility]. Concern for infection after recent abdominal surgery; Pharmacy consulted to convert Eliquis to IV heparin while possible procedures pending. During last admission, heparin was especially difficult to  control, with rates ranging from 1650 units/hr down to 250 units/hr. Patient also noted to have significant bruising and hematomas on several extremities last admission. However, Cardiology has recommended at least 30 days of anticoagulation after DCCV (through 3/4).  PTA Eliquis 5mg  BID, last dose 2/28 09:00.  Baseline INR elevated; aPTT not done  3/4: s/p LLQ abscess drain.  Heparin off 0900-2130.  Resumed without bolus.   Today, 01/30/2021:   Heparin level 0.56 remains therapeutic on heparin 900 units/hr   CBC: Hgb 8.9 stable, Plt 128 decreased  Known bruising.  No bleeding reported.  No infusion problems.  Goal of Therapy:  Heparin level 0.3-0.7 units/ml aPTT 66-102 seconds Monitor platelets by anticoagulation protocol: Yes   Plan:   Continue heparin 900 units/hr   Daily CBC and heparin level  Monitor for s/sx of bleeding  Follow up plans for long-term oral anticoagulation.   Peggyann Juba, PharmD, BCPS Pharmacy: [phone] 01/30/2021 7:10 AM

## 2021-01-30 NOTE — Progress Notes (Signed)
   Daily Progress Note   Patient Name: April [name]       Date: 01/30/2021 DOB: Nov 29, 1955  Age: 65 y.o. MRN#: [id number] Attending Physician: Antonieta Pert, MD Primary Care Physician: [name], MD Admit Date: 12/26/2020  Reason for Consultation/Follow-up: Establishing goals of care  Subjective: Chart Reviewed and Updates Received.   I was notified that family has been considering potential discharge home with home hospice.  I met with April [name] today and tried to clarify further exactly what her hopes moving forward.  Patient resting in bed. Easily aroused. Alert and oriented x3.  She reports she is doing "fair" today.  In talking with April [name], she is clear that she does not want to go to a skilled facility and only wants to go home on discharge.  She also expresses that she is "not ready to give up" and desires to continue with medical care focused on trying to recover when she leaves the hospital.  We discussed options for care when she leaves the hospital including potential discharge plan of home with hospice.  She expressed she is "not sure" and asked me to call her daughter to discuss further.  I called and left a message for patient's daughter, [name].  [name] called me back and we discussed options for care moving forward.  [name] expressed that family is only interested in option for April [name] to transition home the end of this hospitalization.    We discussed potential support of this including home hospice.  After reviewing home hospice philosophy and care plan, [name] expressed that she does not think that hospice lines up with what her mother wants at this time.  [name] would like for her mother to transition home with home health and continue to have her followed by outpatient palliative care, however, family is not interested in home hospice services at this point.  All questions and support provided.   Length of Stay: 10 days  Vital Signs: BP 128/77   Pulse  69   Temp (!) 97.2 F (36.2 C) (Oral)   Resp 20   Ht $R'5\' 1"'wp$  (1.549 m)   Wt 72 kg   SpO2 95%   BMI 29.99 kg/m  SpO2: SpO2: 95 % O2 Device: O2 Device: Nasal Cannula O2 Flow Rate: O2 Flow Rate (L/min): 2 L/min  Physical Exam: Resting, easily aroused Normal respiratory efforts RRR A&O x3            Palliative Care Assessment & Plan    Code Status:  DNR  Goals of Care/Recommendations:  DNR/DNI  Ms. Blasco defers care planning to her daughter, [name].  After discussion, family would like for April [name] to transition home with home health and continue to have her followed by outpatient palliative care, however, family is not interested in home hospice services at this point.  PMT will continue to support and follow as needed. Please contact team line for urgent needs.   Prognosis: Guarded   Discharge Planning: To Be Determined  Thank you for allowing the Palliative Medicine Team to assist in the care of this patient.  Time Total: 40 min.   Visit consisted of counseling and education dealing with the complex and emotionally intense issues of symptom management and palliative care in the setting of serious and potentially life-threatening illness.Greater than 50%  of this time was spent counseling and coordinating care related to the above assessment and plan.  Micheline Rough, MD [place] Team [phone]

## 2021-01-30 NOTE — Progress Notes (Signed)
HC:[id number]  Subjective: Still not eating much, drainage looked a little clearer from IR drain this AM, but not much in the bulb to look at this AM.    She is brighter and a little more like herself this AM.    Objective: Vital signs in last 24 hours: Temp:  [97.5 F (36.4 C)-98.3 F (36.8 C)] 97.7 F (36.5 C) (03/10 0400) Pulse Rate:  [61-74] 68 (03/10 0600) Resp:  [11-25] 12 (03/10 0600) BP: (97-133)/(39-100) 112/52 (03/10 0600) SpO2:  [95 %-100 %] 98 % (03/10 0600) Weight:  [72 kg] 72 kg (03/10 0433) Last BM Date: 01/28/21 477 Po 800 IV Urine 1125 Drain 18 Stool 195 Afebrile, VSS, SBP 120-97 range; on 2L/[place] Creatinine 1.84 WBC 6.9 H/H 8.9/29.8 Platelets 128K   Intake/Output from previous day: 03/09 0701 - 03/10 0700 In: 1304.8 [P.O.:477; I.V.:429; IV Piggyback:398.8] Out: 1338 [Urine:1125; Drains:18; Stool:195] Intake/Output this shift: No intake/output data recorded.  General appearance: alert, cooperative, no distress and still tired and weak Resp: clear to auscultation bilaterally and sats OK off O2 right now. GI: open wound looks good, ostomy is pink with good output thru the ostomy.  Wound is looking much better. Still some minimal greenish tint on the dressing.Mepetial is back in stock and at the base of the wound.    Lab Results:  Recent Labs    01/29/21 0244 01/30/21 0248  WBC 6.6 6.9  HGB 9.6* 8.9*  HCT 31.9* 29.8*  PLT 140* 128*    BMET Recent Labs    01/29/21 0244 01/30/21 0248  NA 139 139  K 3.6 3.6  CL 108 107  CO2 22 20*  GLUCOSE 73 106*  BUN 63* 62*  CREATININE 1.87* 1.84*  CALCIUM 9.0 8.8*   PT/INR No results for input(s): LABPROT, INR in the last 72 hours.  No results for input(s): AST, ALT, ALKPHOS, BILITOT, PROT, ALBUMIN in the last 168 hours.   Lipase     Component Value Date/Time   LIPASE 16 12/27/2020 1343     Medications:  (feeding supplement) PROSource Plus  30 mL Oral BID BM    allopurinol  100 mg Oral Daily   brimonidine  1 drop Both Eyes BID   chlorhexidine  15 mL Mouth Rinse BID   Chlorhexidine Gluconate Cloth  6 each Topical Daily   cycloSPORINE  1 drop Both Eyes BID   dorzolamide  1 drop Both Eyes Daily   feeding supplement  237 mL Oral BID BM   FLUoxetine  40 mg Oral Daily   furosemide  80 mg Intravenous BID   hydrocortisone   Rectal BID   insulin aspart  0-5 Units Subcutaneous QHS   insulin aspart  0-9 Units Subcutaneous TID WC   insulin glargine  5 Units Subcutaneous Daily   latanoprost  1 drop Both Eyes QHS   mouth rinse  15 mL Mouth Rinse q12n4p   metoprolol tartrate  25 mg Oral BID   multivitamin with minerals  1 tablet Oral Daily   mycophenolate  180 mg Oral BID   nutrition supplement (JUVEN)  1 packet Oral BID BM   predniSONE  7.5 mg Oral Q breakfast   sodium chloride flush  3 mL Intravenous Q12H   sodium chloride flush  5 mL Intracatheter Q8H   tacrolimus  1 mg Oral BID   vancomycin  125 mg Oral QID    Assessment/Plan Polycystic disease/ESRDwith renal transplant 2013-Prograf/prednisone. Elevated Cr- 2.19>>2.09>>1.92(3/7)>>1.87>>1.84 Hx CHF Chronic COPD/history of tobacco use-  On 2L PRN at home -Mostly off CPAP now. HxAge indeterminate, non occlusive deep vein thrombosis involving the right popliteal vein Hx SVT/Atrial flutter with RVR/TEE/DC cardioversion 12/25/20 Large hematoma left arm - drained and dressed- cleaning up hematoma still present Anemia- H/H 8.5/27.1>>7.3/24.1>>8.8/29.1>>9.6/31.9>>8.9/29.8 Severe protein calorie malnutrition-Total protein 6.0/albumin 1.9 Hyponatremia Hypercoagulable - INR 2.8 (on Eliquis) Severe malnutrition-prealbumin<5 C. difficile colitis -antigen/toxin + 01/21/2021, tx per medicine  Sepsis and 3.2 x 2.8 x 5.3 cm abdominal fluid/abscess collectionadjacent to enterotomy staple line HxPerforated sigmoid diverticulitis -S/pExploratory laparotomy with sigmoid  colectomy/end colostomy, 12/29/2020 Dr. Coralie Keens -IR drain placed 3/4, cx showsmultiple organisms present, none predominant, no anaerobes isolated -cont local wound carewith mepitel over wound base and NS WD dressing changes BID.   FEN:D3 diet, supplements PE:AKLTYVDP/BAQVOH 2/28>>day 9, Vancomycin IV 2/28 - Vancomycin PO 3/1 >>  - culture: MULTIPLE ORGANISMS PRESENT, NONE PREDOMINANT  NO ANAEROBES ISOLATED; CSP:ZZCKICH gtt -   Plan:  Continue current treatment.  Will discuss length of antibiotics and when we can switch her over to Eliquis from heparin.  Will review with IR and see when they may want to reimage.  Drainage down to 18 cc yesterday and as noted above it currently looks less purulent this AM.      LOS: 10 days    April [name] 01/30/2021 Please see Amion

## 2021-01-30 NOTE — Progress Notes (Signed)
Occupational Therapy Progress Note  Patient continues to require total A x2 for bed mobility due to weakness/frail. Patient initially able to maintain sitting balance with min G assist, participate in g/h. As patient fatigue needing up to max A to maintain sitting balance. Instructed patient in weight shifts and functional reaching to facilitate core activation, patient fatigues after 3 reps with each upper extremity. Pt tolerate sitting EOB ~10 mins before total A to return to supine. Will continue with POC    01/30/21 1213  OT Visit Information  Last OT Received On 01/30/21  Assistance Needed +2  PT/OT/SLP Co-Evaluation/Treatment Yes  Reason for Co-Treatment To address functional/ADL transfers;For patient/therapist safety  OT goals addressed during session ADL's and self-care  History of Present Illness April [name] is a 65 y.o. female admitted on 01/17/2021 secondary to acute encephalopathy with associated hypotension in setting of intraabdominal fluid collection and recent intraabdominal surgery. General surgery and IR consulted for management.  Found to have C. Difficile.  Noted pt's with open abdominal wound. Recently in [facility] for new onset afib from 12/16/20-01/14/21 , DC to SNF. H/O Left hip and wrist fx 11/2019.  Pt with several recent admissions - review PT notes and PATIENT WAS AMBULATORY prior to 01/02/21.  PMH of atrial flutter s/p DCCV, chronic respiratory failure on 2 lpm of oxygen, asthma, possible OSA, CKD stage IIIa, ESRD s/p renal transplant, diastolic heart failure, diabetes, Raynaud's phenomenon.  Precautions  Precautions Fall  Precaution Comments colostomy, open abd wound, left drain  Pain Assessment  Pain Assessment Faces  Faces Pain Scale 6  Pain Location buttocks, back  Pain Descriptors / Indicators Grimacing;Discomfort  Pain Intervention(s) Monitored during session  Cognition  Arousal/Alertness Awake/alert  Behavior During Therapy Flat affect  Overall Cognitive Status No  family/caregiver present to determine baseline cognitive functioning  General Comments Pt with flat affect but did follow all commands and carried on appropriate conversations  Upper Extremity Assessment  Upper Extremity Assessment Generalized weakness  Lower Extremity Assessment  Lower Extremity Assessment Defer to PT evaluation  ADL  Overall ADL's  Needs assistance/impaired  Grooming Wash/dry face;Minimal assistance;Sitting  Grooming Details (indicate cue type and reason) min A for sitting balance, was going to attempt oral care seated EOB however patient with increased fatigue and tremulous  Bed Mobility  Overal bed mobility Needs Assistance  Bed Mobility Supine to Sit;Sit to Supine  Supine to sit Total assist;+2 for physical assistance;+2 for safety/equipment  Sit to supine Total assist;+2 for physical assistance;+2 for safety/equipment  General bed mobility comments Patient requiring 2 total assistance to sit up onto EOb, use of bed pad  to scoot around. Same to return to supine. patient very frail  Balance  Overall balance assessment Needs assistance  Sitting-balance support Feet supported;Bilateral upper extremity supported  Sitting balance-Leahy Scale Poor  Sitting balance - Comments patient sat EOB ~10 mins initially with min G assist however as patient fatigues needing up to max A to maintain static sitting  Postural control Right lateral lean;Posterior lean  Transfers  General transfer comment will require mechanical lift  Exercises  Exercises Other exercises  General Exercises - Lower Extremity  Ankle Circles/Pumps AROM;Both;Supine;10 reps  Long Arc Quad AROM;Seated;Both;5 reps  Other Exercises  Other Exercises weight shifting with reaching crossing body midline to active core stability performed x3 reps before too fatigued  OT - End of Session  Activity Tolerance Patient limited by fatigue  Patient left in bed;with call bell/phone within reach;with bed alarm set  Nurse  Communication Mobility status  OT Assessment/Plan  OT Plan Discharge plan needs to be updated  OT Visit Diagnosis Other abnormalities of gait and mobility (R26.89);Muscle weakness (generalized) (M62.81);History of falling (Z91.81);Pain  Pain - part of body  (global)  OT Frequency (ACUTE ONLY) Min 2X/week  Follow Up Recommendations SNF (vs HH with 24/7 if family declines SNF)  OT Equipment Other (comment) (hoyer lift)  AM-PAC OT "6 Clicks" Daily Activity Outcome Measure (Version 2)  Help from another person eating meals? 3  Help from another person taking care of personal grooming? 3  Help from another person toileting, which includes using toliet, bedpan, or urinal? 1  Help from another person bathing (including washing, rinsing, drying)? 2  Help from another person to put on and taking off regular upper body clothing? 2  Help from another person to put on and taking off regular lower body clothing? 1  6 Click Score 12  OT Goal Progression  Progress towards OT goals Progressing toward goals  Acute Rehab OT Goals  Patient Stated Goal To go home  OT Goal Formulation With patient  Time For Goal Achievement 02/11/21  Potential to Achieve Goals Fair  ADL Goals  Pt Will Perform Grooming with modified independence;bed level  Pt Will Perform Upper Body Dressing with min guard assist;sitting  Pt/caregiver will Perform Home Exercise Program Both right and left upper extremity;Independently;With written HEP provided  Additional ADL Goal #1 Pt will perform bed mobility at mod A prior to engaging in seated ADL tasks  OT Time Calculation  OT Start Time (ACUTE ONLY) 0844  OT Stop Time (ACUTE ONLY) 0909  OT Time Calculation (min) 25 min  OT General Charges  $OT Visit 1 Visit  OT Treatments  $Self Care/Home Management  8-22 mins   Delbert Phenix OT OT pager: [phone]

## 2021-01-30 NOTE — Progress Notes (Signed)
  Speech Language Pathology Treatment: Cognitive-Linquistic  Patient Details Name: April [name] MRN: [id number] DOB: June 15, 1956 Today's Date: 01/30/2021 Time: 1400-1440 SLP Time Calculation (min) (ACUTE ONLY): 40 min  Assessment / Plan / Recommendation Clinical Impression  Pt more alert today, and was able to participate in diagnostic treatment. The [place] Mental Status (SLUMS) Examination was administered to determine deficits and identify appropriate plan of care. Pt exhibits clear voice quality and intelligible speech. She is able to follow commands and answer questions. Pt was able to state that she is right handed, has a college education in social work from [employer]. She currently lives with her husband and mother, and was independent with finances and medication management prior to admit. She was not working prior to admit.  Pt scored 15/30 on SLUMS examination, indicative of neurocognitive deficits. Points were lost on orientation to day of week, mental math, immediate and delayed recall, thought organization (naming category members), digit reversal, and auditory attention and recall of short paragraph information. These deficits raise concern for pt safety and independence, due to decreased recall, attention, and higher levels of cognitive processing. Continued ST intervention is recommended to maximize independence and safety, and decrease caregiver burden. ST also recommended at next venue.    HPI HPI: 65yo female admitted 01/10/2021 unresponsive and hypoxic. PMH: PAFib s/p DCCV, chronic hypoxic resporatory failure, asthma, OSA, COPD, depression, GERD, CKD3a, ESRD, renal transplant, chronic diastolic heart failure, IDDM, Raynaud's, recent hospitalization for perforated diverticulitis, septic shock with ex lap and colostomy.      SLP Plan  Continue with current plan of care       Recommendations   Continued ST acutely, and ongoing ST intervention at next venue of care.                 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 Quentin Ore, [facility], [place] Speech Language Pathologist Office: [phone] Pager: [phone]  Shonna Chock 01/30/2021, 2:50 PM

## 2021-01-30 NOTE — Progress Notes (Signed)
PROGRESS NOTE    April [name]  OFB:[id number] DOB: 03/11/1956 DOA: 12/26/2020 PCP: [name], MD   Chief Complaint  Patient presents with  . Hypotension  . hypoxia   Brief Narrative: 65 year old female with medical history of A flutter status post, chronic respiratory failure on 2 L oxygen, asthma, OSA, CKD stage IIIa, ESRD post transplant, diastolic heart failure, diabetes presented secondary to acute encephalopathy with associated hypotension in setting of intraabdominal fluid collection and recent intraabdominal surgery. General surgery and IR was consulted for management. Patient managed empirically on antibiotics. Found to have C. Difficile and started on Vancomycin PO.  Status post IR drain placement 3/4-being managed by IR, general surgery.  Patient has colostomy and lower abdominal wall wound and managed by dressing changes.  Subjective: Seen and examined this morning.  She is more alert awake and oriented today.   Surgery in the room changing the dressing. Minimal output in the JP drain. Colostomy with small amt of stool.  Assessment & Plan:  Intra-abdominal fluid/abscess, adjacent to enterotomy staple line History of perforated sigmoid diverticulitis Status post ex lap with sigmoid colectomy/end colostomy 12/29/2020 by Dr. Ninfa Linden IR drain placement 3/4 -culture with multiple organisms-none predominant no anaerobes. Drain being managed by IR and colostomy/dressing managed by surgery.   On cefepime, Flagyl day #10-continue as per surgery recommendation. Tolerating dysphagia 3 diet, remains afebrile and has no leukocytosis  C. difficile colitis positive antigen/toxin 3/1: Mild symptoms, on oral vancomycin course 01/21/21- course planned for 10 days.  Stool output stable on colostomy  Large hematoma Left arm drained and dressed  Acute on chronic diastolic CHF Anasarca On IV Lasix 80 mg twice daily.  Renal function stable and tolerating well.  Urine output 1125.  Keep  it at least for next 24 hours. Net IO Since Admission: -2,299.07 mL [01/30/21 0741]  Polycystic kidney disease ESRD hx s/p renal transplant 2013 on immunosuppressant and chronic steroid AKI on CKD stage III: Renal function stable tolerating diuretics.  Continue her home Prograf, cellcept,and prednisone. Recent Labs  Lab 01/26/21 1208 01/27/21 0338 01/28/21 0830 01/29/21 0244 01/30/21 0248  BUN 65* 64* 56* 63* 62*  CREATININE 2.12* 1.92* 1.87* 1.87* 1.84*   History of adrenal insufficiency continue her prednisone 7.5 mg  Acute on chronic respiratory failure with hypoxia: Stable at 2 L nasal cannula baseline.  PT OT incentive spirometry to continue   Acute metabolic encephalopathy: Resolved but remain.  Very deconditioned  Hypotension received normal saline bolus previously.  Blood pressure remains stable at times soft in 90s   Acute urine retention:?  Etiology Foley placed on admission.  Remains on Foley on IV Lasix plan for voiding trial prior to discharge.  Acute on chronic anemia Normocytic anemia Iron deficiency anemia in the setting of recent abdominal surgery left arm hematoma.  Hemoglobin holding 8 to 9 g, monitor remained stable Recent Labs  Lab 01/26/21 0145 01/27/21 0338 01/28/21 0246 01/29/21 0244 01/30/21 0248  HGB 8.9* 8.6* 8.8* 9.6* 8.9*  HCT 28.0* 28.0* 29.1* 31.9* 29.8*   Typical atrial flutter, TEE/cardioversion 2/6 and in sinus and has been on Eliquis, on IV heparin this admission, on metoprolol 25 mg twice daily.  If no further pressure anticipated tolerating p.o. we can transition to Eliquis-discussed with surgery and await for surgery recommendation.  T2DM: Blood sugar w/ a episode of hypoglycemia 65 on 3/9 am- 2/2 poor oral intake changed Lantus from 5 units twice daily to once a day and sugar remains stable.Increase oral intake.On  SSI. Recent Labs  Lab 01/29/21 0821 01/29/21 1135 01/29/21 1618 01/29/21 2032 01/30/21 0715  GLUCAP 77 121* 223*  462* 90   Metabolic acidosis with low bicarb.  Resolved. Depression continue her Prozac appears depressed and deconditioned  Cont her allopurinol,  Goals of care: Palliative care has been involved she is DNR has complex comorbidities at risk of decompensation prognosis appears guarded.  Very deconditioned and frail skin nursing facility has been recommended but patient and family at this time wanting to return home w/ hospice  Diet Order            DIET DYS 3 Room service appropriate? Yes with Assist; Fluid consistency: Thin  Diet effective now                 Nutrition Problem: Increased nutrient needs Etiology: acute illness,wound healing Signs/Symptoms: estimated needs Interventions: Juven,Magic cup,Ensure Enlive (each supplement provides 350kcal and 20 grams of protein),Prostat Patient's Body mass index is 29.99 kg/m.  DVT prophylaxis: Heprain iv Code Status:   Code Status: DNR Family Communication: plan of care discussed with patient. I had discussion with daughter previously.  No family at bedside today.  Status is: Inpatient Remains inpatient appropriate because:IV treatments appropriate due to intensity of illness or inability to take PO and Inpatient level of care appropriate due to severity of illness  Dispo: The patient is from: Home              Anticipated d/c is to: Home in 1-2 days once okay /w IR,CCS.              Patient currently is not medically stable to d/c.   Difficult to place patient No Unresulted Labs (From admission, onward)          Start     Ordered   01/28/21 7035  Basic metabolic panel  Daily,   R     Question:  Specimen collection method  Answer:  Lab=Lab collect   01/28/21 0719   01/26/21 0500  Heparin level (unfractionated)  Daily,   R     Question:  Specimen collection method  Answer:  Lab=Lab collect   01/25/21 0635   01/25/21 0500  CBC  Daily,   R     Question:  Specimen collection method  Answer:  Lab=Lab collect   01/24/21 1347          Medications reviewed:  Scheduled Meds: . (feeding supplement) PROSource Plus  30 mL Oral BID BM  . allopurinol  100 mg Oral Daily  . brimonidine  1 drop Both Eyes BID  . chlorhexidine  15 mL Mouth Rinse BID  . Chlorhexidine Gluconate Cloth  6 each Topical Daily  . cycloSPORINE  1 drop Both Eyes BID  . dorzolamide  1 drop Both Eyes Daily  . feeding supplement  237 mL Oral BID BM  . FLUoxetine  40 mg Oral Daily  . furosemide  80 mg Intravenous BID  . hydrocortisone   Rectal BID  . insulin aspart  0-5 Units Subcutaneous QHS  . insulin aspart  0-9 Units Subcutaneous TID WC  . insulin glargine  5 Units Subcutaneous Daily  . latanoprost  1 drop Both Eyes QHS  . mouth rinse  15 mL Mouth Rinse q12n4p  . metoprolol tartrate  25 mg Oral BID  . multivitamin with minerals  1 tablet Oral Daily  . mycophenolate  180 mg Oral BID  . nutrition supplement (JUVEN)  1 packet Oral BID BM  .  predniSONE  7.5 mg Oral Q breakfast  . sodium chloride flush  3 mL Intravenous Q12H  . sodium chloride flush  5 mL Intracatheter Q8H  . tacrolimus  1 mg Oral BID  . vancomycin  125 mg Oral QID   Continuous Infusions: . sodium chloride 10 mL/hr at 01/28/21 0832  . sodium chloride Stopped (01/30/21 0105)  . ceFEPime (MAXIPIME) IV Stopped (01/29/21 1850)  . heparin 900 Units/hr (01/30/21 0400)  . metronidazole Stopped (01/30/21 0029)    Consultants:see note  Procedures:see note  Antimicrobials: Anti-infectives (From admission, onward)   Start     Dose/Rate Route Frequency Ordered Stop   01/21/21 2200  vancomycin (VANCOCIN) 50 mg/mL oral solution 125 mg        125 mg Oral 4 times daily 01/21/21 2028 01/31/21 2159   01/21/21 1800  ceFEPIme (MAXIPIME) 2 g in sodium chloride 0.9 % 100 mL IVPB        2 g 200 mL/hr over 30 Minutes Intravenous Every 24 hours 01/05/2021 1742     01/21/21 0000  metroNIDAZOLE (FLAGYL) IVPB 500 mg        500 mg 100 mL/hr over 60 Minutes Intravenous Every 8 hours 12/28/2020  1821     12/24/2020 1800  vancomycin (VANCOCIN) IVPB 1000 mg/200 mL premix  Status:  Discontinued        1,000 mg 200 mL/hr over 60 Minutes Intravenous Every 48 hours 12/30/2020 1742 01/22/21 1053   01/18/2021 1545  ceFEPIme (MAXIPIME) 2 g in sodium chloride 0.9 % 100 mL IVPB        2 g 200 mL/hr over 30 Minutes Intravenous  Once 01/07/2021 1544 01/03/2021 1625   12/28/2020 1545  metroNIDAZOLE (FLAGYL) IVPB 500 mg        500 mg 100 mL/hr over 60 Minutes Intravenous  Once 01/04/2021 1544 12/29/2020 1730   12/29/2020 1545  vancomycin (VANCOCIN) IVPB 1000 mg/200 mL premix  Status:  Discontinued        1,000 mg 200 mL/hr over 60 Minutes Intravenous  Once 01/02/2021 1544 01/09/2021 1847     Culture/Microbiology    Component Value Date/Time   SDES  01/24/2021 1308    ABSCESS Performed at [facility], [place] [address] [place], [place] [address]    SPECREQUEST  01/24/2021 1308    NONE Performed at [facility], [place] [address] [place], [place] [address]    CULT (A) 01/24/2021 1308    MULTIPLE ORGANISMS PRESENT, NONE PREDOMINANT NO ANAEROBES ISOLATED; CULTURE IN PROGRESS FOR 5 DAYS    REPTSTATUS PENDING 01/24/2021 1308    Other culture-see note  Objective: Vitals: Today's Vitals   01/30/21 0400 01/30/21 0433 01/30/21 0500 01/30/21 0600  BP: (!) 111/43  (!) 119/46 (!) 112/52  Pulse: 72  71 68  Resp: 16  11 12   Temp: 97.7 F (36.5 C)     TempSrc: Oral     SpO2: 96%  95% 98%  Weight:  72 kg    Height:      PainSc: Asleep       Intake/Output Summary (Last 24 hours) at 01/30/2021 0741 Last data filed at 01/30/2021 0400 Gross per 24 hour  Intake 1304.75 ml  Output 1338 ml  Net -33.25 ml   Filed Weights   01/28/21 0500 01/29/21 0500 01/30/21 0433  Weight: 73.3 kg 70.3 kg 72 kg   Weight change: 1.7 kg  Intake/Output from previous day: 03/09 0701 - 03/10 0700 In: 1304.8 [P.O.:477; I.V.:429; IV  Piggyback:398.8] Out: 1338 [Urine:1125; Drains:18;  Stool:195] Intake/Output this shift: No intake/output data recorded. Filed Weights   01/28/21 0500 01/29/21 0500 01/30/21 0433  Weight: 73.3 kg 70.3 kg 72 kg    Examination:  General exam: AAOx3, older than stated age, weak and frail looking. HEENT:Oral mucosa moist, Ear/Nose WNL grossly, dentition normal. Respiratory system: bilaterally diminished,no wheezing or crackles,no use of accessory muscle Cardiovascular system: S1 & S2 +, No JVD,. Gastrointestinal system: Abdomen soft, colostomy in place, NG tube in place, right lower quadrant\evaluated dressing intact granulation tissue at base, BS+ Nervous System:Alert, awake, moving extremities and grossly nonfocal Extremities: b/l le edema+, distal peripheral pulses palpable.  Skin: No rashes,no icterus. MSK: Normal muscle bulk,tone, power  Data Reviewed: I have personally reviewed following labs and imaging studies CBC: Recent Labs  Lab 01/26/21 0145 01/27/21 0338 01/28/21 0246 01/29/21 0244 01/30/21 0248  WBC 8.5 6.2 6.3 6.6 6.9  HGB 8.9* 8.6* 8.8* 9.6* 8.9*  HCT 28.0* 28.0* 29.1* 31.9* 29.8*  MCV 100.0 104.1* 105.1* 105.6* 106.8*  PLT 223 166 157 140* 932*   Basic Metabolic Panel: Recent Labs  Lab 01/26/21 1208 01/27/21 0338 01/28/21 0830 01/29/21 0244 01/30/21 0248  NA 134* 133* 136 139 139  K 3.3* 3.5 3.7 3.6 3.6  CL 103 106 109 108 107  CO2 20* 22 23 22  20*  GLUCOSE 200* 183* 92 73 106*  BUN 65* 64* 56* 63* 62*  CREATININE 2.12* 1.92* 1.87* 1.87* 1.84*  CALCIUM 8.9 8.9 9.2 9.0 8.8*   GFR: Estimated Creatinine Clearance: 28.4 mL/min (A) (by C-G formula based on SCr of 1.84 mg/dL (H)). Liver Function Tests: No results for input(s): AST, ALT, ALKPHOS, BILITOT, PROT, ALBUMIN in the last 168 hours. No results for input(s): LIPASE, AMYLASE in the last 168 hours. No results for input(s): AMMONIA in the last 168 hours. Coagulation Profile: Recent Labs  Lab 01/24/21 0252  INR 1.9*   Cardiac Enzymes: No  results for input(s): CKTOTAL, CKMB, CKMBINDEX, TROPONINI in the last 168 hours. BNP (last 3 results) No results for input(s): PROBNP in the last 8760 hours. HbA1C: No results for input(s): HGBA1C in the last 72 hours. CBG: Recent Labs  Lab 01/29/21 0821 01/29/21 1135 01/29/21 1618 01/29/21 2032 01/30/21 0715  GLUCAP 77 121* 223* 203* 90   Lipid Profile: No results for input(s): CHOL, HDL, LDLCALC, TRIG, CHOLHDL, LDLDIRECT in the last 72 hours. Thyroid Function Tests: No results for input(s): TSH, T4TOTAL, FREET4, T3FREE, THYROIDAB in the last 72 hours. Anemia Panel: No results for input(s): VITAMINB12, FOLATE, FERRITIN, TIBC, IRON, RETICCTPCT in the last 72 hours. Sepsis Labs: No results for input(s): PROCALCITON, LATICACIDVEN in the last 168 hours.  Recent Results (from the past 240 hour(s))  Resp Panel by RT-PCR (Flu A&B, Covid) Nasopharyngeal Swab     Status: None   Collection Time: 01/01/2021  2:05 PM   Specimen: Nasopharyngeal Swab; Nasopharyngeal(NP) swabs in vial transport medium  Result Value Ref Range Status   SARS Coronavirus 2 by RT PCR NEGATIVE NEGATIVE Final    Comment: (NOTE) SARS-CoV-2 target nucleic acids are NOT DETECTED.  The SARS-CoV-2 RNA is generally detectable in upper respiratory specimens during the acute phase of infection. The lowest concentration of SARS-CoV-2 viral copies this assay can detect is 138 copies/mL. A negative result does not preclude SARS-Cov-2 infection and should not be used as the sole basis for treatment or other patient management decisions. A negative result may occur with  improper specimen collection/handling, submission of specimen other  than nasopharyngeal swab, presence of viral mutation(s) within the areas targeted by this assay, and inadequate number of viral copies(<138 copies/mL). A negative result must be combined with clinical observations, patient history, and epidemiological information. The expected result is  Negative.  Fact Sheet for Patients:  EntrepreneurPulse.com.au  Fact Sheet for Healthcare Providers:  IncredibleEmployment.be  This test is no t yet approved or cleared by the [place] FDA and  has been authorized for detection and/or diagnosis of SARS-CoV-2 by FDA under an Emergency Use Authorization (EUA). This EUA will remain  in effect (meaning this test can be used) for the duration of the COVID-19 declaration under Section 564(b)(1) of the Act, 21 U.S.C.section 360bbb-3(b)(1), unless the authorization is terminated  or revoked sooner.       Influenza A by PCR NEGATIVE NEGATIVE Final   Influenza B by PCR NEGATIVE NEGATIVE Final    Comment: (NOTE) The Xpert Xpress SARS-CoV-2/FLU/RSV plus assay is intended as an aid in the diagnosis of influenza from Nasopharyngeal swab specimens and should not be used as a sole basis for treatment. Nasal washings and aspirates are unacceptable for Xpert Xpress SARS-CoV-2/FLU/RSV testing.  Fact Sheet for Patients: EntrepreneurPulse.com.au  Fact Sheet for Healthcare Providers: IncredibleEmployment.be  This test is not yet approved or cleared by the [place] FDA and has been authorized for detection and/or diagnosis of SARS-CoV-2 by FDA under an Emergency Use Authorization (EUA). This EUA will remain in effect (meaning this test can be used) for the duration of the COVID-19 declaration under Section 564(b)(1) of the Act, 21 U.S.C. section 360bbb-3(b)(1), unless the authorization is terminated or revoked.  Performed at [facility], [place] [address] [place], [place] [address]   Culture, blood (Routine x 2)     Status: None   Collection Time: 01/11/2021  3:28 PM   Specimen: BLOOD  Result Value Ref Range Status   Specimen Description   Final    BLOOD RIGHT ARM Performed at [place] [address] [place], [place]  [address]    Special Requests   Final    BOTTLES DRAWN AEROBIC AND ANAEROBIC Blood Culture adequate volume Performed at [place] [address] [place], [place] [address]    Culture   Final    NO GROWTH 5 DAYS Performed at [facility], [place] [address] [place], [place] [address]    Report Status 01/25/2021 FINAL  Final  MRSA PCR Screening     Status: None   Collection Time: 01/19/2021  6:20 PM   Specimen: Nasopharyngeal  Result Value Ref Range Status   MRSA by PCR NEGATIVE NEGATIVE Final    Comment:        The GeneXpert MRSA Assay (FDA approved for NASAL specimens only), is one component of a comprehensive MRSA colonization surveillance program. It is not intended to diagnose MRSA infection nor to guide or monitor treatment for MRSA infections. Performed at [facility], [place] [address], [place], [place] [address]   Urine culture     Status: None   Collection Time: 01/17/2021  6:21 PM   Specimen: Urine, Random  Result Value Ref Range Status   Specimen Description   Final    URINE, RANDOM Performed at [place] [address], [place], [place] [address]    Special Requests   Final    NONE Performed at [facility], [place] [address], [place], [place] [address]    Culture  Final    NO GROWTH Performed at [facility], [place] [address], [place], [place] [address]    Report Status 01/22/2021 FINAL  Final  C Difficile Quick Screen w PCR reflex     Status: Abnormal   Collection Time: 01/21/21  3:43 PM   Specimen: STOOL  Result Value Ref Range Status   C Diff antigen POSITIVE (A) NEGATIVE Final   C Diff toxin POSITIVE (A) NEGATIVE Final   C Diff interpretation Toxin producing C. difficile detected.  Final    Comment: CRITICAL RESULT CALLED TO, READ BACK BY AND VERIFIED WITH: P.MERCADO AT 2017 ON 03.01.22 BY N.THOMPSON Performed at [facility], [place] [address]  [place], [place] [address]   Aerobic/Anaerobic Culture (surgical/deep wound)     Status: Abnormal (Preliminary result)   Collection Time: 01/24/21  1:08 PM   Specimen: Abscess  Result Value Ref Range Status   Specimen Description   Final    ABSCESS Performed at [place] [address] [place], [place] [address]    Special Requests   Final    NONE Performed at [facility], [place] [address], [place], [place] [address]    Gram Stain   Final    MODERATE WBC PRESENT, PREDOMINANTLY PMN MODERATE GRAM POSITIVE COCCI IN PAIRS IN CLUSTERS FEW GRAM POSITIVE RODS RARE GRAM NEGATIVE RODS Performed at [facility], [place] [address], [place], [place] [address]    Culture (A)  Final    MULTIPLE ORGANISMS PRESENT, NONE PREDOMINANT NO ANAEROBES ISOLATED; CULTURE IN PROGRESS FOR 5 DAYS    Report Status PENDING  Incomplete     Radiology Studies: No results found.   LOS: 10 days   Antonieta Pert, MD [facility]  01/30/2021, 7:41 AM

## 2021-01-31 ENCOUNTER — Inpatient Hospital Stay (HOSPITAL_COMMUNITY): Payer: Medicare Other

## 2021-01-31 DIAGNOSIS — G9341 Metabolic encephalopathy: Secondary | ICD-10-CM | POA: Diagnosis not present

## 2021-01-31 LAB — GLUCOSE, CAPILLARY
Glucose-Capillary: 82 mg/dL (ref 70–99)
Glucose-Capillary: 121 mg/dL — ABNORMAL HIGH (ref 70–99)
Glucose-Capillary: 124 mg/dL — ABNORMAL HIGH (ref 70–99)
Glucose-Capillary: 111 mg/dL — ABNORMAL HIGH (ref 70–99)

## 2021-01-31 LAB — BASIC METABOLIC PANEL
Anion gap: 11 (ref 5–15)
BUN: 62 mg/dL — ABNORMAL HIGH (ref 8–23)
CO2: 22 mmol/L (ref 22–32)
Calcium: 8.6 mg/dL — ABNORMAL LOW (ref 8.9–10.3)
Chloride: 107 mmol/L (ref 98–111)
Creatinine, Ser: 2.06 mg/dL — ABNORMAL HIGH (ref 0.44–1.00)
GFR, Estimated: 27 mL/min — ABNORMAL LOW (ref >60)
Glucose, Bld: 89 mg/dL (ref 70–99)
Potassium: 3.3 mmol/L — ABNORMAL LOW (ref 3.5–5.1)
Sodium: 140 mmol/L (ref 135–145)

## 2021-01-31 LAB — CBC
HCT: 30.9 % — ABNORMAL LOW (ref 36.0–46.0)
Hemoglobin: 9 g/dL — ABNORMAL LOW (ref 12.0–15.0)
MCH: 31.6 pg (ref 26.0–34.0)
MCHC: 29.1 g/dL — ABNORMAL LOW (ref 30.0–36.0)
MCV: 108.4 fL — ABNORMAL HIGH (ref 80.0–100.0)
Platelets: 110 10*3/uL — ABNORMAL LOW (ref 150–400)
RBC: 2.85 MIL/uL — ABNORMAL LOW (ref 3.87–5.11)
RDW: 23.6 % — ABNORMAL HIGH (ref 11.5–15.5)
WBC: 6.5 10*3/uL (ref 4.0–10.5)
nRBC: 0 % (ref 0.0–0.2)

## 2021-01-31 LAB — HEPARIN LEVEL (UNFRACTIONATED): Heparin Unfractionated: 0.68 IU/mL (ref 0.30–0.70)

## 2021-01-31 MED ORDER — POTASSIUM CHLORIDE CRYS ER 20 MEQ PO TBCR
20.0000 meq | EXTENDED_RELEASE_TABLET | Freq: Once | ORAL | Status: AC
  Administered 2021-01-31: 20 meq via ORAL
  Filled 2021-01-31: qty 1

## 2021-01-31 MED ORDER — APIXABAN 5 MG PO TABS
5.0000 mg | ORAL_TABLET | Freq: Two times a day (BID) | ORAL | Status: DC
  Administered 2021-01-31 – 2021-02-04 (×10): 5 mg via ORAL
  Filled 2021-01-31 (×10): qty 1

## 2021-01-31 MED ORDER — PROCHLORPERAZINE EDISYLATE 10 MG/2ML IJ SOLN
5.0000 mg | Freq: Four times a day (QID) | INTRAMUSCULAR | Status: DC | PRN
  Administered 2021-01-31 – 2021-02-06 (×4): 5 mg via INTRAVENOUS
  Filled 2021-01-31 (×4): qty 2

## 2021-01-31 MED ORDER — MORPHINE SULFATE (PF) 2 MG/ML IV SOLN
2.0000 mg | INTRAVENOUS | Status: DC | PRN
  Administered 2021-02-02 – 2021-02-04 (×2): 2 mg via INTRAVENOUS
  Filled 2021-01-31 (×2): qty 1

## 2021-01-31 NOTE — Progress Notes (Signed)
Chaplain engaged in an initial visit with April [name].  April [name] was able to serve as a witness on the unit for another patient.  April [name] expressed that eating has been the biggest challenge for her mom right now.  She noted that her mom has endured a lot lately.  Chaplain offered listening.  Chaplain will follow-up.    01/31/21 1500  Clinical Encounter Type  Visited With Family  Visit Type Initial

## 2021-01-31 NOTE — Care Management Important Message (Signed)
Important Message  Patient Details IM Letter given to the Patient Name: LESLE FARON MRN: [id number] Date of Birth: [birth year]   Medicare Important Message Given:        Kerin Salen 01/31/2021, 9:35 AM

## 2021-01-31 NOTE — Progress Notes (Signed)
IO:[id number]  Subjective: Still not eating much (<25% of meals) but states she is drinking 2 ensure daily.   She is alert. Denies new complaints.  Objective: Vital signs in last 24 hours: Temp:  [97.2 F (36.2 C)-98.1 F (36.7 C)] 97.4 F (36.3 C) (03/11 0624) Pulse Rate:  [53-88] 65 (03/11 0624) Resp:  [14-22] 14 (03/11 0624) BP: (105-153)/(45-106) 128/90 (03/11 0624) SpO2:  [92 %-100 %] 92 % (03/11 0624) Weight:  [72.2 kg] 72.2 kg (03/11 0624) Last BM Date: 01/30/21 (small amount of output in ostomy) 477 Po 800 IV Urine 1125 Drain 18 Stool 195 Afebrile, VSS, SBP 120-97 range; on 2L/[place] Creatinine 1.84 WBC 6.9 H/H 8.9/29.8 Platelets 128K   Intake/Output from previous day: 03/10 0701 - 03/11 0700 In: 295.4 [I.V.:115.4; IV Piggyback:180] Out: 1500 [Urine:1500] Intake/Output this shift: Total I/O In: 60 [P.O.:60] Out: -   General appearance: alert, cooperative, no distress and still tired and weak Resp: clear to auscultation bilaterally and sats OK off O2 right now. GI: open wound looks good, ostomy is pink with good output thru the ostomy. Wound is looking much better. Still some minimal greenish tint on the dressing.Mepitel is back in stock and at the base of the wound.     Lab Results:  Recent Labs    01/30/21 0248 01/31/21 0555  WBC 6.9 6.5  HGB 8.9* 9.0*  HCT 29.8* 30.9*  PLT 128* 110*    BMET Recent Labs    01/30/21 0248 01/31/21 0555  NA 139 140  K 3.6 3.3*  CL 107 107  CO2 20* 22  GLUCOSE 106* 89  BUN 62* 62*  CREATININE 1.84* 2.06*  CALCIUM 8.8* 8.6*   PT/INR No results for input(s): LABPROT, INR in the last 72 hours.  No results for input(s): AST, ALT, ALKPHOS, BILITOT, PROT, ALBUMIN in the last 168 hours.   Lipase     Component Value Date/Time   LIPASE 16 01/16/2021 1343     Medications: . (feeding supplement) PROSource Plus  30 mL Oral BID BM  . allopurinol  100 mg Oral Daily  . apixaban  5 mg Oral BID   . brimonidine  1 drop Both Eyes BID  . chlorhexidine  15 mL Mouth Rinse BID  . Chlorhexidine Gluconate Cloth  6 each Topical Daily  . cycloSPORINE  1 drop Both Eyes BID  . dorzolamide  1 drop Both Eyes Daily  . feeding supplement  237 mL Oral BID BM  . FLUoxetine  40 mg Oral Daily  . hydrocortisone   Rectal BID  . insulin aspart  0-5 Units Subcutaneous QHS  . insulin aspart  0-9 Units Subcutaneous TID WC  . insulin glargine  5 Units Subcutaneous Daily  . latanoprost  1 drop Both Eyes QHS  . mouth rinse  15 mL Mouth Rinse q12n4p  . metoprolol tartrate  25 mg Oral BID  . multivitamin with minerals  1 tablet Oral Daily  . mycophenolate  180 mg Oral BID  . nutrition supplement (JUVEN)  1 packet Oral BID BM  . predniSONE  7.5 mg Oral Q breakfast  . sodium chloride flush  3 mL Intravenous Q12H  . sodium chloride flush  5 mL Intracatheter Q8H  . tacrolimus  1 mg Oral BID  . vancomycin  125 mg Oral QID    Assessment/Plan Polycystic disease/ESRDwith renal transplant 2013-Prograf/prednisone. Elevated Cr- 2.19>>2.09>>1.92(3/7)>>1.87>>1.84 Hx CHF Chronic COPD/history of tobacco use- On 2L PRN at home -Mostly off CPAP now. HxAge indeterminate,  non occlusive deep vein thrombosis involving the right popliteal vein Hx SVT/Atrial flutter with RVR/TEE/DC cardioversion 12/25/20  Large hematoma left arm - drained and dressed- cleaning up hematoma still present Anemia- H/H 9.0/30, stable Severe protein calorie malnutrition-Total protein 6.0/albumin 1.9 Hyponatremia Hypercoagulable - INR 2.8 (on Eliquis) Severe malnutrition-prealbumin<5 C. difficile colitis -antigen/toxin + 01/21/2021, tx per medicine  Sepsis and 3.2 x 2.8 x 5.3 cm abdominal fluid/abscess collectionadjacent to enterotomy staple line HxPerforated sigmoid diverticulitis -S/pExploratory laparotomy with sigmoid colectomy/end colostomy, 12/29/2020 Dr. Coralie Keens -IR drain placed 3/4, cx showsmultiple organisms  present, none predominant, no anaerobes isolated -cont local wound carewith mepitel over wound base and NS WD dressing changes BID.   FEN:D3 diet, supplements EW:[id number]/EZVGJF 2/28>>day 10, Vancomycin IV 2/28 - Vancomycin PO 3/1 >>  - culture: MULTIPLE ORGANISMS PRESENT, NONE PREDOMINANT  NO ANAEROBES ISOLATED; TNB:ZXYDSWV gtt - ok to transition to Eliquis from a CCS standpoint, no procedures planned.   Plan:  Continue current treatment. Continue drain per IR recommendations. Continue abx.    LOS: 11 days    Jill Alexanders 01/31/2021 Please see Amion

## 2021-01-31 NOTE — Progress Notes (Signed)
PROGRESS NOTE    April [name]  EHM:[id number] DOB: [birth year] DOA: 12/27/2020 PCP: [name], MD   Chief Complaint  Patient presents with  . Hypotension  . hypoxia   Brief Narrative: 65 year old female with medical history of A flutter status post, chronic respiratory failure on 2 L oxygen, asthma, OSA, CKD stage IIIa, ESRD post transplant, diastolic heart failure, diabetes presented secondary to acute encephalopathy with associated hypotension in setting of intraabdominal fluid collection and recent intraabdominal surgery. General surgery and IR was consulted for management. Patient managed empirically on antibiotics. Found to have C. Difficile and started on Vancomycin PO.  Status post IR drain placement 3/4-being managed by IR, general surgery.  Patient has colostomy and lower abdominal wall wound and managed by dressing changes.  Subjective: Seen and examined this morning.  Patient resting comfortably.   Remains very deconditioned with poor oral intake.  Appears weak frail older than his stated age.  Alert  Stools soft/liquidy but no obvious diarrhea in colostomy, JP drain in place  Assessment & Plan:  Intra-abdominal fluid/abscess, adjacent to enterotomy staple line History of perforated sigmoid diverticulitis Status post ex lap with sigmoid colectomy/end colostomy 12/29/2020 by Dr. Ninfa Linden IR drain placement 3/4 -culture with multiple organisms-none predominant no anaerobes. Drain being managed by IR and colostomy/dressing managed by surgery-healing slowly.  She remains on Cefepime, Flagyl day #11-continue as per surgery recommendation. Tolerating dysphagia 3 diet, encourage oral intake, monitor intake output   C. difficile colitis positive antigen/toxin 3/1: Mild symptoms, on oral vancomycin course 01/21/21- course planned for 10 days.  No diarrhea on colostomy.  Acute on chronic diastolic CHF Anasarca On IV Lasix 80 mg twice daily-we will stop today since creatinine is  uptrending.  We will resume oral Lasix tomorrow based upon renal function. Net IO Since Admission: -3,443.7 mL [01/31/21 0949]  Polycystic kidney disease ESRD hx s/p renal transplant 2013 on immunosuppressant and chronic steroid AKI on CKD stage III: Renal function with slightly uptrending creatinine will hold off on Lasix.Continue her home Prograf, cellcept,and prednisone. Recent Labs  Lab 01/27/21 0338 01/28/21 0830 01/29/21 0244 01/30/21 0248 01/31/21 0555  BUN 64* 56* 63* 62* 62*  CREATININE 1.92* 1.87* 1.87* 1.84* 2.06*   History of adrenal insufficiency on her prednisone 7.5 mg  Acute on chronic respiratory failure with hypoxia: Remains a stable on 2 L nasal cannula baseline.  PT OT incentive spirometry to continue   Acute metabolic encephalopathy: Improved alert awake but deconditioned.    Hypotension received normal saline bolus previously.  Blood pressure is stabilized.  Acute urine retention:?  Etiology Foley placed on admission.  On Foley now since stopping Lasix hopefully we can do voiding trial in am.  Acute on chronic anemia/Normocytic anemia/Iron deficiency anemia Large hematoma Left arm drained and dressed.monitor arm: Anemia in the setting of recent abdominal surgery left arm hematoma.  Hemoglobin holding 8 to 9 g, monitor remained stable Recent Labs  Lab 01/27/21 0338 01/28/21 0246 01/29/21 0244 01/30/21 0248 01/31/21 0555  HGB 8.6* 8.8* 9.6* 8.9* 9.0*  HCT 28.0* 29.1* 31.9* 29.8* 30.9*   Typical atrial flutter, TEE/cardioversion 2/6 and in sinus and has been on Eliquis, on IV heparin this admission, on metoprolol 25 mg twice daily.  If no further pressure anticipated tolerating p.o. discussed with surgery and okay to transition to Eliquis stop heparin.   T2DM: Blood sugar w/ a episode of hypoglycemia 66 on 3/9 am- 2/2 poor oral intake changed Lantus from 5 units twice daily to once  a day and and sugar overall stable.  Cont ssi. Recent Labs  Lab  01/30/21 1136 01/30/21 1639 01/30/21 1756 01/30/21 2154 01/31/21 0722  GLUCAP 149* 173* 154* 595* 82   Metabolic acidosis with low bicarb.  Resolved. Depression continue her Prozac appears depressed and deconditioned  Cont her allopurinol,  Goals of care: Palliative care has been involved she is DNR has complex comorbidities at risk of decompensation prognosis appears guarded.  Very deconditioned and frail  SNF has been recommended but patient and family at this time wanting to return home w/ hospice.  Diet Order            DIET DYS 3 Room service appropriate? Yes with Assist; Fluid consistency: Thin  Diet effective now                 Nutrition Problem: Increased nutrient needs Etiology: acute illness,wound healing Signs/Symptoms: estimated needs Interventions: Juven,Magic cup,Ensure Enlive (each supplement provides 350kcal and 20 grams of protein),Prostat Patient's Body mass index is 30.08 kg/m.  DVT prophylaxis: Heprain iv Code Status:   Code Status: DNR Family Communication: plan of care discussed with patient. I had discussion with daughter previously.  No family at bedside today. Discussed with surgery, palliative care.  Status is: Inpatient Remains inpatient appropriate because:IV treatments appropriate due to intensity of illness or inability to take PO and Inpatient level of care appropriate due to severity of illness  Dispo: The patient is from: Home              Anticipated d/c is to: Home in 1-2 days once okay /w IR,CCS.              Patient currently is not medically stable to d/c.   Difficult to place patient No Unresulted Labs (From admission, onward)          Start     Ordered   02/01/21 0500  CBC  Tomorrow morning,   R       Question:  Specimen collection method  Answer:  Lab=Lab collect   01/31/21 0926   01/28/21 6387  Basic metabolic panel  Daily,   R     Question:  Specimen collection method  Answer:  Lab=Lab collect   01/28/21 0719          Medications reviewed:  Scheduled Meds: . (feeding supplement) PROSource Plus  30 mL Oral BID BM  . allopurinol  100 mg Oral Daily  . apixaban  5 mg Oral BID  . brimonidine  1 drop Both Eyes BID  . chlorhexidine  15 mL Mouth Rinse BID  . Chlorhexidine Gluconate Cloth  6 each Topical Daily  . cycloSPORINE  1 drop Both Eyes BID  . dorzolamide  1 drop Both Eyes Daily  . feeding supplement  237 mL Oral BID BM  . FLUoxetine  40 mg Oral Daily  . hydrocortisone   Rectal BID  . insulin aspart  0-5 Units Subcutaneous QHS  . insulin aspart  0-9 Units Subcutaneous TID WC  . insulin glargine  5 Units Subcutaneous Daily  . latanoprost  1 drop Both Eyes QHS  . mouth rinse  15 mL Mouth Rinse q12n4p  . metoprolol tartrate  25 mg Oral BID  . multivitamin with minerals  1 tablet Oral Daily  . mycophenolate  180 mg Oral BID  . nutrition supplement (JUVEN)  1 packet Oral BID BM  . predniSONE  7.5 mg Oral Q breakfast  . sodium chloride flush  3  mL Intravenous Q12H  . sodium chloride flush  5 mL Intracatheter Q8H  . tacrolimus  1 mg Oral BID  . vancomycin  125 mg Oral QID   Continuous Infusions: . sodium chloride 10 mL/hr at 01/28/21 0832  . sodium chloride Stopped (01/30/21 0105)  . ceFEPime (MAXIPIME) IV 2 g (01/30/21 1838)  . metronidazole 500 mg (01/31/21 0055)    Consultants:see note  Procedures:see note  Antimicrobials: Anti-infectives (From admission, onward)   Start     Dose/Rate Route Frequency Ordered Stop   01/21/21 2200  vancomycin (VANCOCIN) 50 mg/mL oral solution 125 mg        125 mg Oral 4 times daily 01/21/21 2028 01/31/21 2159   01/21/21 1800  ceFEPIme (MAXIPIME) 2 g in sodium chloride 0.9 % 100 mL IVPB        2 g 200 mL/hr over 30 Minutes Intravenous Every 24 hours 01/14/2021 1742     01/21/21 0000  metroNIDAZOLE (FLAGYL) IVPB 500 mg        500 mg 100 mL/hr over 60 Minutes Intravenous Every 8 hours 01/07/2021 1821     01/07/2021 1800  vancomycin (VANCOCIN) IVPB 1000 mg/200  mL premix  Status:  Discontinued        1,000 mg 200 mL/hr over 60 Minutes Intravenous Every 48 hours 01/18/2021 1742 01/22/21 1053   12/25/2020 1545  ceFEPIme (MAXIPIME) 2 g in sodium chloride 0.9 % 100 mL IVPB        2 g 200 mL/hr over 30 Minutes Intravenous  Once 01/08/2021 1544 01/15/2021 1625   12/25/2020 1545  metroNIDAZOLE (FLAGYL) IVPB 500 mg        500 mg 100 mL/hr over 60 Minutes Intravenous  Once 12/26/2020 1544 01/06/2021 1730   01/02/2021 1545  vancomycin (VANCOCIN) IVPB 1000 mg/200 mL premix  Status:  Discontinued        1,000 mg 200 mL/hr over 60 Minutes Intravenous  Once 01/04/2021 1544 12/29/2020 1847     Culture/Microbiology    Component Value Date/Time   SDES  01/24/2021 1308    ABSCESS Performed at [facility], [place] [address], [place], [place] [address]    SPECREQUEST  01/24/2021 1308    NONE Performed at [facility], [place] [address], [place], [place] [address]    CULT (A) 01/24/2021 1308    MULTIPLE ORGANISMS PRESENT, NONE PREDOMINANT NO ANAEROBES ISOLATED Performed at [facility], [place] [address], [place], [place] [address]    REPTSTATUS 01/30/2021 FINAL 01/24/2021 1308    Other culture-see note  Objective: Vitals: Today's Vitals   01/30/21 2325 01/31/21 0241 01/31/21 0624 01/31/21 0700  BP:  (!) 105/45 128/90   Pulse:  (!) 53 65   Resp:  14 14   Temp:  97.6 F (36.4 C) (!) 97.4 F (36.3 C)   TempSrc:  Oral Oral   SpO2:  93% 92%   Weight:   72.2 kg   Height:      PainSc: Asleep   Asleep    Intake/Output Summary (Last 24 hours) at 01/31/2021 0949 Last data filed at 01/31/2021 0900 Gross per 24 hour  Intake 209.81 ml  Output 1000 ml  Net -790.19 ml   Filed Weights   01/29/21 0500 01/30/21 0433 01/31/21 0624  Weight: 70.3 kg 72 kg 72.2 kg   Weight change: 0.2 kg  Intake/Output from previous day: 03/10 0701 - 03/11 0700 In: 295.4 [I.V.:115.4; IV Piggyback:180] Out: 1500 [Urine:1500] Intake/Output this  shift: Total  I/O In: 60 [P.O.:60] Out: -  Filed Weights   01/29/21 0500 01/30/21 0433 01/31/21 0624  Weight: 70.3 kg 72 kg 72.2 kg    Examination: General exam: AAOx3,weak frail  HEENT:Oral mucosa moist, Ear/Nose WNL grossly, dentition normal. Respiratory system: bilaterally diminishedd,no wheezing or crackles,no use of accessory muscle Cardiovascular system:S1 & S2 +, No JVD,. Gastrointestinal system:Abdomen soft, colostomy in place JP drain in place,minimal greenish tint on the dressing on the wound,BS+. Nervous System:Alert, awake, moving extremities and grossly nonfocal Extremities: mild edema,distal peripheral pulses palpable.  Skin: No rashes,no icterus. MSK: Normal muscle bulk,tone, power  Data Reviewed: I have personally reviewed following labs and imaging studies CBC: Recent Labs  Lab 01/27/21 0338 01/28/21 0246 01/29/21 0244 01/30/21 0248 01/31/21 0555  WBC 6.2 6.3 6.6 6.9 6.5  HGB 8.6* 8.8* 9.6* 8.9* 9.0*  HCT 28.0* 29.1* 31.9* 29.8* 30.9*  MCV 104.1* 105.1* 105.6* 106.8* 108.4*  PLT 166 157 140* 128* 096*   Basic Metabolic Panel: Recent Labs  Lab 01/27/21 0338 01/28/21 0830 01/29/21 0244 01/30/21 0248 01/31/21 0555  NA 133* 136 139 139 140  K 3.5 3.7 3.6 3.6 3.3*  CL 106 109 108 107 107  CO2 22 23 22  20* 22  GLUCOSE 183* 92 73 106* 89  BUN 64* 56* 63* 62* 62*  CREATININE 1.92* 1.87* 1.87* 1.84* 2.06*  CALCIUM 8.9 9.2 9.0 8.8* 8.6*   GFR: Estimated Creatinine Clearance: 25.4 mL/min (A) (by C-G formula based on SCr of 2.06 mg/dL (H)). Liver Function Tests: No results for input(s): AST, ALT, ALKPHOS, BILITOT, PROT, ALBUMIN in the last 168 hours. No results for input(s): LIPASE, AMYLASE in the last 168 hours. No results for input(s): AMMONIA in the last 168 hours. Coagulation Profile: No results for input(s): INR, PROTIME in the last 168 hours. Cardiac Enzymes: No results for input(s): CKTOTAL, CKMB, CKMBINDEX, TROPONINI in the last 168 hours. BNP  (last 3 results) No results for input(s): PROBNP in the last 8760 hours. HbA1C: No results for input(s): HGBA1C in the last 72 hours. CBG: Recent Labs  Lab 01/30/21 1136 01/30/21 1639 01/30/21 1756 01/30/21 2154 01/31/21 0722  GLUCAP 149* 173* 154* 123* 82   Lipid Profile: No results for input(s): CHOL, HDL, LDLCALC, TRIG, CHOLHDL, LDLDIRECT in the last 72 hours. Thyroid Function Tests: No results for input(s): TSH, T4TOTAL, FREET4, T3FREE, THYROIDAB in the last 72 hours. Anemia Panel: No results for input(s): VITAMINB12, FOLATE, FERRITIN, TIBC, IRON, RETICCTPCT in the last 72 hours. Sepsis Labs: No results for input(s): PROCALCITON, LATICACIDVEN in the last 168 hours.  Recent Results (from the past 240 hour(s))  C Difficile Quick Screen w PCR reflex     Status: Abnormal   Collection Time: 01/21/21  3:43 PM   Specimen: STOOL  Result Value Ref Range Status   C Diff antigen POSITIVE (A) NEGATIVE Final   C Diff toxin POSITIVE (A) NEGATIVE Final   C Diff interpretation Toxin producing C. difficile detected.  Final    Comment: CRITICAL RESULT CALLED TO, READ BACK BY AND VERIFIED WITH: P.MERCADO AT 2017 ON 03.01.22 BY N.THOMPSON Performed at [facility], [place] [address], [place], [place] [address]   Aerobic/Anaerobic Culture (surgical/deep wound)     Status: Abnormal   Collection Time: 01/24/21  1:08 PM   Specimen: Abscess  Result Value Ref Range Status   Specimen Description   Final    ABSCESS Performed at [place] [address] [place], [place] [address]  Special Requests   Final    NONE Performed at [facility], [place] [address], [place], [place] [address]    Gram Stain   Final    MODERATE WBC PRESENT, PREDOMINANTLY PMN MODERATE GRAM POSITIVE COCCI IN PAIRS IN CLUSTERS FEW GRAM POSITIVE RODS RARE GRAM NEGATIVE RODS    Culture (A)  Final    MULTIPLE ORGANISMS PRESENT, NONE PREDOMINANT NO ANAEROBES  ISOLATED Performed at [facility], [place] [address], [place], [place] [address]    Report Status 01/30/2021 FINAL  Final     Radiology Studies: No results found.   LOS: 11 days   Antonieta Pert, MD [facility]  01/31/2021, 9:49 AM

## 2021-01-31 NOTE — Progress Notes (Signed)
Orders to hold Lantus per Remesh this morning. Blood Sugar 82.

## 2021-02-01 ENCOUNTER — Encounter (HOSPITAL_COMMUNITY): Payer: Self-pay | Admitting: Internal Medicine

## 2021-02-01 DIAGNOSIS — A0472 Enterocolitis due to Clostridium difficile, not specified as recurrent: Secondary | ICD-10-CM

## 2021-02-01 DIAGNOSIS — F3341 Major depressive disorder, recurrent, in partial remission: Secondary | ICD-10-CM | POA: Insufficient documentation

## 2021-02-01 DIAGNOSIS — J45909 Unspecified asthma, uncomplicated: Secondary | ICD-10-CM | POA: Insufficient documentation

## 2021-02-01 DIAGNOSIS — I82431 Acute embolism and thrombosis of right popliteal vein: Secondary | ICD-10-CM

## 2021-02-01 DIAGNOSIS — I770 Arteriovenous fistula, acquired: Secondary | ICD-10-CM | POA: Insufficient documentation

## 2021-02-01 DIAGNOSIS — Z7901 Long term (current) use of anticoagulants: Secondary | ICD-10-CM

## 2021-02-01 DIAGNOSIS — Z7409 Other reduced mobility: Secondary | ICD-10-CM | POA: Insufficient documentation

## 2021-02-01 DIAGNOSIS — F331 Major depressive disorder, recurrent, moderate: Secondary | ICD-10-CM | POA: Insufficient documentation

## 2021-02-01 DIAGNOSIS — Z8639 Personal history of other endocrine, nutritional and metabolic disease: Secondary | ICD-10-CM | POA: Insufficient documentation

## 2021-02-01 DIAGNOSIS — G9341 Metabolic encephalopathy: Secondary | ICD-10-CM | POA: Diagnosis not present

## 2021-02-01 DIAGNOSIS — Z6835 Body mass index (BMI) 35.0-35.9, adult: Secondary | ICD-10-CM | POA: Insufficient documentation

## 2021-02-01 DIAGNOSIS — J329 Chronic sinusitis, unspecified: Secondary | ICD-10-CM | POA: Insufficient documentation

## 2021-02-01 DIAGNOSIS — K651 Peritoneal abscess: Secondary | ICD-10-CM | POA: Diagnosis not present

## 2021-02-01 DIAGNOSIS — J302 Other seasonal allergic rhinitis: Secondary | ICD-10-CM | POA: Insufficient documentation

## 2021-02-01 DIAGNOSIS — Z933 Colostomy status: Secondary | ICD-10-CM | POA: Diagnosis not present

## 2021-02-01 DIAGNOSIS — M5430 Sciatica, unspecified side: Secondary | ICD-10-CM | POA: Insufficient documentation

## 2021-02-01 DIAGNOSIS — Z7189 Other specified counseling: Secondary | ICD-10-CM | POA: Diagnosis not present

## 2021-02-01 DIAGNOSIS — J4540 Moderate persistent asthma, uncomplicated: Secondary | ICD-10-CM | POA: Insufficient documentation

## 2021-02-01 DIAGNOSIS — M810 Age-related osteoporosis without current pathological fracture: Secondary | ICD-10-CM | POA: Insufficient documentation

## 2021-02-01 DIAGNOSIS — H3581 Retinal edema: Secondary | ICD-10-CM | POA: Insufficient documentation

## 2021-02-01 DIAGNOSIS — D696 Thrombocytopenia, unspecified: Secondary | ICD-10-CM | POA: Insufficient documentation

## 2021-02-01 DIAGNOSIS — K5731 Diverticulosis of large intestine without perforation or abscess with bleeding: Secondary | ICD-10-CM | POA: Insufficient documentation

## 2021-02-01 DIAGNOSIS — R0600 Dyspnea, unspecified: Secondary | ICD-10-CM | POA: Insufficient documentation

## 2021-02-01 DIAGNOSIS — J9610 Chronic respiratory failure, unspecified whether with hypoxia or hypercapnia: Secondary | ICD-10-CM | POA: Insufficient documentation

## 2021-02-01 DIAGNOSIS — R0989 Other specified symptoms and signs involving the circulatory and respiratory systems: Secondary | ICD-10-CM | POA: Insufficient documentation

## 2021-02-01 LAB — CBC
HCT: 31.1 % — ABNORMAL LOW (ref 36.0–46.0)
Hemoglobin: 9.3 g/dL — ABNORMAL LOW (ref 12.0–15.0)
MCH: 32.2 pg (ref 26.0–34.0)
MCHC: 29.9 g/dL — ABNORMAL LOW (ref 30.0–36.0)
MCV: 107.6 fL — ABNORMAL HIGH (ref 80.0–100.0)
Platelets: 93 10*3/uL — ABNORMAL LOW (ref 150–400)
RBC: 2.89 MIL/uL — ABNORMAL LOW (ref 3.87–5.11)
RDW: 23.8 % — ABNORMAL HIGH (ref 11.5–15.5)
WBC: 7.4 10*3/uL (ref 4.0–10.5)
nRBC: 0 % (ref 0.0–0.2)

## 2021-02-01 LAB — BASIC METABOLIC PANEL
Anion gap: 12 (ref 5–15)
BUN: 59 mg/dL — ABNORMAL HIGH (ref 8–23)
CO2: 20 mmol/L — ABNORMAL LOW (ref 22–32)
Calcium: 8.5 mg/dL — ABNORMAL LOW (ref 8.9–10.3)
Chloride: 107 mmol/L (ref 98–111)
Creatinine, Ser: 2.15 mg/dL — ABNORMAL HIGH (ref 0.44–1.00)
GFR, Estimated: 25 mL/min — ABNORMAL LOW (ref >60)
Glucose, Bld: 77 mg/dL (ref 70–99)
Potassium: 3.8 mmol/L (ref 3.5–5.1)
Sodium: 139 mmol/L (ref 135–145)

## 2021-02-01 LAB — GLUCOSE, CAPILLARY
Glucose-Capillary: 71 mg/dL (ref 70–99)
Glucose-Capillary: 87 mg/dL (ref 70–99)
Glucose-Capillary: 108 mg/dL — ABNORMAL HIGH (ref 70–99)
Glucose-Capillary: 121 mg/dL — ABNORMAL HIGH (ref 70–99)

## 2021-02-01 MED ORDER — SODIUM CHLORIDE 0.9 % IV SOLN: INTRAVENOUS | Status: AC

## 2021-02-01 NOTE — Progress Notes (Signed)
Subjective/Chief Complaint: She is alert. Denies new complaints.    Objective: Vital signs in last 24 hours: Temp:  [94 F (34.4 C)-98.7 F (37.1 C)] 97.3 F (36.3 C) (03/12 0605) Pulse Rate:  [65-109] 109 (03/12 0605) Resp:  [14-16] 14 (03/12 0605) BP: (112-146)/(51-102) 139/102 (03/12 0605) SpO2:  [90 %-95 %] 90 % (03/12 0605) Last BM Date: 01/31/21  Intake/Output from previous day: 03/11 0701 - 03/12 0700 In: 60 [P.O.:60] Out: 625 [Urine:600] Intake/Output this shift: No intake/output data recorded.   General appearance: alert, cooperative, no distress and still tired and weak Resp: clear to auscultation bilaterally and sats OK off O2 right now. GI: open wound looks good, ostomy is pink with good output thru the ostomy. Wound overall  Looks better and is cleaning up less fibrinous exudate  Lab Results:  Recent Labs    01/31/21 0555 02/01/21 0546  WBC 6.5 7.4  HGB 9.0* 9.3*  HCT 30.9* 31.1*  PLT 110* 93*   BMET Recent Labs    01/31/21 0555 02/01/21 0546  NA 140 139  K 3.3* 3.8  CL 107 107  CO2 22 20*  GLUCOSE 89 77  BUN 62* 59*  CREATININE 2.06* 2.15*  CALCIUM 8.6* 8.5*   PT/INR No results for input(s): LABPROT, INR in the last 72 hours. ABG No results for input(s): PHART, HCO3 in the last 72 hours.  Invalid input(s): PCO2, PO2  Studies/Results: No results found.  Anti-infectives: Anti-infectives (From admission, onward)   Start     Dose/Rate Route Frequency Ordered Stop   01/21/21 2200  vancomycin (VANCOCIN) 50 mg/mL oral solution 125 mg        125 mg Oral 4 times daily 01/21/21 2028 01/31/21 2159   01/21/21 1800  ceFEPIme (MAXIPIME) 2 g in sodium chloride 0.9 % 100 mL IVPB        2 g 200 mL/hr over 30 Minutes Intravenous Every 24 hours 01/18/2021 1742     01/21/21 0000  metroNIDAZOLE (FLAGYL) IVPB 500 mg        500 mg 100 mL/hr over 60 Minutes Intravenous Every 8 hours 01/18/2021 1821     01/03/2021 1800  vancomycin (VANCOCIN) IVPB 1000  mg/200 mL premix  Status:  Discontinued        1,000 mg 200 mL/hr over 60 Minutes Intravenous Every 48 hours 12/31/2020 1742 01/22/21 1053   12/25/2020 1545  ceFEPIme (MAXIPIME) 2 g in sodium chloride 0.9 % 100 mL IVPB        2 g 200 mL/hr over 30 Minutes Intravenous  Once 01/06/2021 1544 01/11/2021 1625   01/05/2021 1545  metroNIDAZOLE (FLAGYL) IVPB 500 mg        500 mg 100 mL/hr over 60 Minutes Intravenous  Once 01/03/2021 1544 12/30/2020 1730   01/10/2021 1545  vancomycin (VANCOCIN) IVPB 1000 mg/200 mL premix  Status:  Discontinued        1,000 mg 200 mL/hr over 60 Minutes Intravenous  Once 01/02/2021 1544 01/14/2021 1847      Assessment/Plan:   Polycystic disease/ESRDwith renal transplant 2013-Prograf/prednisone. Elevated Cr- 2.19>>2.09>>1.92(3/7)>>1.87>>1.84 Hx CHF Chronic COPD/history of tobacco use- On 2L PRN at home -Mostly off CPAP now. HxAge indeterminate, non occlusive deep vein thrombosis involving the right popliteal vein Hx SVT/Atrial flutter with RVR/TEE/DC cardioversion 12/25/20  Large hematoma left arm - drained and dressed- cleaning up hematoma still present Anemia- H/H 9.0/30, stable Severe protein calorie malnutrition-Total protein 6.0/albumin 1.9 Hyponatremia Hypercoagulable - INR 2.8 (on Eliquis) Severe malnutrition-prealbumin<5 C. difficile colitis -  antigen/toxin + 01/21/2021, tx per medicine  Sepsis and 3.2 x 2.8 x 5.3 cm abdominal fluid/abscess collectionadjacent to enterotomy staple line HxPerforated sigmoid diverticulitis -S/pExploratory laparotomy with sigmoid colectomy/end colostomy, 12/29/2020 Dr. Coralie Keens -IR drain placed 3/4, cx showsmultiple organisms present, none predominant, no anaerobes isolated -cont local wound carewith mepitel over wound base and NS WD dressing changes BID.   FEN:D3 diet, supplements NO:IBBCWUGQ/BVQXIH 2/28>>day 10, Vancomycin IV 2/28 - Vancomycin PO 3/1 >>  - culture: MULTIPLE ORGANISMS PRESENT, NONE  PREDOMINANT  NO ANAEROBES ISOLATED; WTU:UEKCMKL gtt - ok to transition to Eliquis from a CCS standpoint, no procedures planned.   Plan:  Continue current treatment. Continue drain per IR recommendations. Continue abx.  LOS: 12 days    Turner Daniels MD  02/01/2021

## 2021-02-01 NOTE — Progress Notes (Signed)
Pharmacy Antibiotic Note  April [name] is a 65 y.o. female admitted on 01/06/2021 with abdominal wound infection, s/p IR drain on 3/4.  Pharmacy was consulted for cefepime dosing.  Also receiving Flagyl for anaerobic coverage.  Plan:  Continue cefepime 2g IV q24 per current renal function  Flagyl 500mg  IV q8h per MD dosing  Follow up renal function & cultures  Height: 5\' 1"  (154.9 cm) Weight: 72.2 kg (159 lb 2.8 oz) IBW/kg (Calculated) : 47.8  Temp (24hrs), Avg:96.8 F (36 C), Min:94 F (34.4 C), Max:98.7 F (37.1 C)  Recent Labs  Lab 01/28/21 0246 01/28/21 0830 01/29/21 0244 01/30/21 0248 01/31/21 0555 02/01/21 0546  WBC 6.3  --  6.6 6.9 6.5 7.4  CREATININE  --  1.87* 1.87* 1.84* 2.06* 2.15*    Estimated Creatinine Clearance: 24.4 mL/min (A) (by C-G formula based on SCr of 2.15 mg/dL (H)).    Allergies  Allergen Reactions  . Infed [Iron Dextran] Other (See Comments)    Chest tightness  . Pentamidine Itching, Shortness Of Breath and Swelling  . Budesonide-Formoterol Fumarate     Other reaction(s): hoarseness  . Bupropion     Other reaction(s): exacerbated depression  . Crestor [Rosuvastatin]     Other reaction(s): body aches, swelling  . Duloxetine Hcl     Other reaction(s): GI SE, weepy, worsening fatigue, foggy  . Erythromycin [Erythromycin] Other (See Comments)    Mouth Ulcers  . Iohexol Other (See Comments)    Per patient "she has had a kidney transplant and should never have contrast"  . Oxycodone Nausea Only and Nausea And Vomiting  . Pravastatin Sodium     Other reaction(s): myalgias  . Erythromycin Rash    Causes breakout in mouth  . Ultram [Tramadol Hcl] Anxiety   Antimicrobials this admission: 2/28 vanc IV >>3/2 2/28 cefepime >> 2/28 flagyl >> 3/1 Vanc PO >>3/11   Dose adjustments this admission:  Microbiology results: 2/28 Covid: negative 2/28 MRSA PCR: negative 2/28 BCx: NGF 2/28 UCx: NGF 3/1 CDiff: Ag +, toxin + 3/4 Abscess:  moderate GPC, few GPR, rare GNR  Thank you for allowing pharmacy to be a part of this patient's care.  Clayburn Pert, PharmD, BCPS 02/01/2021  9:47 AM Please see AMION for all Pharmacists' Contact Phone Numbers

## 2021-02-01 NOTE — Progress Notes (Signed)
PROGRESS NOTE    April [name]  QMG:[id number] DOB: 19-Feb-1956 DOA: 12/28/2020 PCP: [name], MD   Chief Complaint  Patient presents with  . Hypotension  . hypoxia   Brief Narrative: 65 year old female with medical history of A flutter status post, chronic respiratory failure on 2 L oxygen, asthma, OSA, CKD stage IIIa, ESRD post transplant, diastolic heart failure, diabetes presented secondary to acute encephalopathy with associated hypotension in setting of intraabdominal fluid collection and recent intraabdominal surgery. General surgery and IR was consulted for management. Patient managed empirically on antibiotics. Found to have C. Difficile and started on Vancomycin PO.  Status post IR drain placement 3/4-being managed by IR, general surgery.  Patient has colostomy and lower abdominal wall wound and managed by dressing changes. Patient remains very weak deconditioned with poor oral intake.  She is at risk of decompensation.  Palliative care on board she is DNR.  Subjective: Seen this morning. Patient is alert awake but appears deconditioned still with very poor oral intake was having a lot of nausea yesterday but reports Compazine has helped. Patient dry creatinines uptrending blood sugar on lower side  Assessment & Plan: Intra-abdominal fluid/abscess, adjacent to enterotomy staple line History of perforated sigmoid diverticulitis Status post ex lap with sigmoid colectomy/end colostomy 12/29/2020 by Dr. Ninfa Linden IR drain placement 3/4 -culture with multiple organisms-none predominant no anaerobes. Drain being managed by IR and colostomy/dressing managed by surgery-having stool output, wound healing slowly.  Surgery following closely.  Remains on Cefepime, Flagyl day #12-no fever no leukocytosis continue antibiotics/CCS as per surgery.  IR has ordered CT abdomen to manage her drain. Tolerating dysphagia 3 diet  C. difficile colitis positive antigen/toxin 3/1: Mild symptoms, on  oral vancomycin course 01/21/21- course planned for 10 days.  No diarrhea on colostomy.  Acute on chronic diastolic CHF/Anasarca Swelling has improved .lungs are clear on exam.  Patient now appears on dry side given gentle IV fluids given her elevated creatinine.  Off lasix 01/31/21. Net IO Since Admission: -4,068.7 mL [02/01/21 1028]  Polycystic kidney disease ESRD hx s/p renal transplant 2013 on immunosuppressant and chronic steroid AKI on CKD stage III: Creatinine appears to be uptrending further, stopped her Lasix 3/11.  Since her p.o. intake is poor appears dry we will keep on IV fluid NS 50 ml/hr-next 24 hours. She is on Prograf, cellcept,and prednisone. Recent Labs  Lab 01/28/21 0830 01/29/21 0244 01/30/21 0248 01/31/21 0555 02/01/21 0546  BUN 56* 63* 62* 62* 59*  CREATININE 1.87* 1.87* 1.84* 2.06* 2.15*   History of adrenal insufficiency on her prednisone 7.5 mg.  Acute on chronic respiratory failure with hypoxia: Continue nasal cannula at 2 L at baseline.  Continue PT OT encourage ambulation, continue incentive spirometry.    Acute metabolic encephalopathy: Mentation is stable alert awake but deconditioned.    Hypotension received normal saline bolus previously.  BP stable now.   Acute urine retention:?  Etiology Foley placed on admission.  On Foley now since stopping Lasix hopefully we can do voiding trial, if creatinine stable tomorrow can discontinue  Acute on chronic anemia/Normocytic anemia/Iron deficiency anemia Large hematoma Left arm drained and dressed.monitor arm: Anemia in the setting of recent abdominal surgery left arm hematoma.  Hemoglobin has remained stable no active blood loss noted. Recent Labs  Lab 01/28/21 0246 01/29/21 0244 01/30/21 0248 01/31/21 0555 02/01/21 0546  HGB 8.8* 9.6* 8.9* 9.0* 9.3*  HCT 29.1* 31.9* 29.8* 30.9* 31.1*   Typical atrial flutter, TEE/cardioversion 2/6 and in sinus and  has been on Eliquis, on IV heparin this admission, on  metoprolol 25 mg twice daily.  Heparin was discontinued and patient was placed back on Eliquis after cleared by surgery 3/11.   T2DM: Blood sugar w/ a episode of hypoglycemia 66 on 3/9 am-CBG again low due to poor intake, I will discontinue Lantus altogether keep on sliding scale insulin.  Recent Labs  Lab 01/31/21 0722 01/31/21 1144 01/31/21 1644 01/31/21 2105 02/01/21 0817  GLUCAP 82 121* 124* 378* 71   Metabolic acidosis with low bicarb.  Resolved. Depression continue her Prozac appears depressed and deconditioned  Cont her allopurinol,  Goals of care: Palliative care has been involved she is DNR has complex comorbidities at risk of decompensation prognosis appears guarded.  Very deconditioned and frail  SNF has been recommended but patient and family at this time wanting to return home w/ hospice. Patient again remains high risk for decompensation, has very poor intake.  Will need ongoing palliative care follow-up. Daughter wondering about PEG tube/?NG tube - patient may not be interested -I have encouraged them to discuss.  Diet Order            DIET DYS 3 Room service appropriate? Yes with Assist; Fluid consistency: Thin  Diet effective now                 Nutrition Problem: Increased nutrient needs Etiology: acute illness,wound healing Signs/Symptoms: estimated needs Interventions: Juven,Magic cup,Ensure Enlive (each supplement provides 350kcal and 20 grams of protein),Prostat Patient's Body mass index is 30.08 kg/m.  DVT prophylaxis: Heprain iv Code Status:   Code Status: DNR Family Communication: plan of care discussed with patient. Updated patient's daughter over the phone this morning and understands overall frail condition at risk of decompensation.  Status is: Inpatient Remains inpatient appropriate because:IV treatments appropriate due to intensity of illness or inability to take PO and Inpatient level of care appropriate due to severity of illness  Dispo:  The patient is from: Home              Anticipated d/c is to: Home in >3 days              Patient currently is not medically stable to d/c.   Difficult to place patient No Unresulted Labs (From admission, onward)         None     Medications reviewed:  Scheduled Meds: . (feeding supplement) PROSource Plus  30 mL Oral BID BM  . allopurinol  100 mg Oral Daily  . apixaban  5 mg Oral BID  . brimonidine  1 drop Both Eyes BID  . chlorhexidine  15 mL Mouth Rinse BID  . Chlorhexidine Gluconate Cloth  6 each Topical Daily  . cycloSPORINE  1 drop Both Eyes BID  . dorzolamide  1 drop Both Eyes Daily  . feeding supplement  237 mL Oral BID BM  . FLUoxetine  40 mg Oral Daily  . hydrocortisone   Rectal BID  . insulin aspart  0-5 Units Subcutaneous QHS  . insulin aspart  0-9 Units Subcutaneous TID WC  . latanoprost  1 drop Both Eyes QHS  . mouth rinse  15 mL Mouth Rinse q12n4p  . metoprolol tartrate  25 mg Oral BID  . multivitamin with minerals  1 tablet Oral Daily  . mycophenolate  180 mg Oral BID  . nutrition supplement (JUVEN)  1 packet Oral BID BM  . predniSONE  7.5 mg Oral Q breakfast  . sodium chloride flush  3 mL Intravenous Q12H  . sodium chloride flush  5 mL Intracatheter Q8H  . tacrolimus  1 mg Oral BID   Continuous Infusions: . sodium chloride 10 mL/hr at 01/28/21 0832  . sodium chloride Stopped (01/30/21 0105)  . sodium chloride 50 mL/hr at 02/01/21 1028  . ceFEPime (MAXIPIME) IV 2 g (01/31/21 1804)  . metronidazole 500 mg (02/01/21 0033)    Consultants:see note  Procedures:see note  Antimicrobials: Anti-infectives (From admission, onward)   Start     Dose/Rate Route Frequency Ordered Stop   01/21/21 2200  vancomycin (VANCOCIN) 50 mg/mL oral solution 125 mg        125 mg Oral 4 times daily 01/21/21 2028 01/31/21 2159   01/21/21 1800  ceFEPIme (MAXIPIME) 2 g in sodium chloride 0.9 % 100 mL IVPB        2 g 200 mL/hr over 30 Minutes Intravenous Every 24 hours 01/04/2021  1742     01/21/21 0000  metroNIDAZOLE (FLAGYL) IVPB 500 mg        500 mg 100 mL/hr over 60 Minutes Intravenous Every 8 hours 01/01/2021 1821     12/24/2020 1800  vancomycin (VANCOCIN) IVPB 1000 mg/200 mL premix  Status:  Discontinued        1,000 mg 200 mL/hr over 60 Minutes Intravenous Every 48 hours 12/26/2020 1742 01/22/21 1053   01/05/2021 1545  ceFEPIme (MAXIPIME) 2 g in sodium chloride 0.9 % 100 mL IVPB        2 g 200 mL/hr over 30 Minutes Intravenous  Once 01/05/2021 1544 12/30/2020 1625   01/19/2021 1545  metroNIDAZOLE (FLAGYL) IVPB 500 mg        500 mg 100 mL/hr over 60 Minutes Intravenous  Once 01/10/2021 1544 12/28/2020 1730   01/19/2021 1545  vancomycin (VANCOCIN) IVPB 1000 mg/200 mL premix  Status:  Discontinued        1,000 mg 200 mL/hr over 60 Minutes Intravenous  Once 01/15/2021 1544 01/02/2021 1847     Culture/Microbiology    Component Value Date/Time   SDES  01/24/2021 1308    ABSCESS Performed at [facility], [place] [address] [place], [place] [address]    SPECREQUEST  01/24/2021 1308    NONE Performed at [facility], [place] [address], [place], [place] [address]    CULT (A) 01/24/2021 1308    MULTIPLE ORGANISMS PRESENT, NONE PREDOMINANT NO ANAEROBES ISOLATED Performed at [facility], [place] [address] [place], [place] [address]    REPTSTATUS 01/30/2021 FINAL 01/24/2021 1308    Other culture-see note  Objective: Vitals: Today's Vitals   01/31/21 2109 02/01/21 0024 02/01/21 0041 02/01/21 0605  BP: (!) 141/64   (!) 139/102  Pulse: 69   (!) 109  Resp: 14   14  Temp: 98.7 F (37.1 C)   (!) 97.3 F (36.3 C)  TempSrc: Oral   Oral  SpO2: 93%   90%  Weight:      Height:      PainSc:  0-No pain 7      Intake/Output Summary (Last 24 hours) at 02/01/2021 1028 Last data filed at 02/01/2021 0640 Gross per 24 hour  Intake --  Output 625 ml  Net -625 ml   Filed Weights   01/29/21 0500 01/30/21 0433 01/31/21 0624  Weight: 70.3 kg 72 kg  72.2 kg   Weight change:   Intake/Output from previous day: 03/11 0701 - 03/12 0700 In: 60 [P.O.:60] Out: 625 [Urine:600] Intake/Output this shift: No  intake/output data recorded. Filed Weights   01/29/21 0500 01/30/21 0433 01/31/21 0624  Weight: 70.3 kg 72 kg 72.2 kg    Examination:  General exam: AA oriented but deconditioned frail older than his stated age, on Vaughn,NAD, weak appearing. HEENT:Oral mucosa moist, Ear/Nose WNL grossly, dentition normal. Respiratory system: bilaterally diminishedd,no wheezing or crackles,no use of accessory muscle Cardiovascular system: S1 & S2 +, No JVD,. Gastrointestinal system: Abdomen soft, colostomy with stool present, JP drain present in the left lower quadrant, large wound with dressing in place on lower abdomen, mild tenderness,BS+ Nervous System:Alert, awake, moving extremities and grossly nonfocal Extremities: No edema, distal peripheral pulses palpable.  Skin: No rashes,no icterus. MSK: thin muscle bulk,tone, power.  Data Reviewed: I have personally reviewed following labs and imaging studies CBC: Recent Labs  Lab 01/28/21 0246 01/29/21 0244 01/30/21 0248 01/31/21 0555 02/01/21 0546  WBC 6.3 6.6 6.9 6.5 7.4  HGB 8.8* 9.6* 8.9* 9.0* 9.3*  HCT 29.1* 31.9* 29.8* 30.9* 31.1*  MCV 105.1* 105.6* 106.8* 108.4* 107.6*  PLT 157 140* 128* 110* 93*   Basic Metabolic Panel: Recent Labs  Lab 01/28/21 0830 01/29/21 0244 01/30/21 0248 01/31/21 0555 02/01/21 0546  NA 136 139 139 140 139  K 3.7 3.6 3.6 3.3* 3.8  CL 109 108 107 107 107  CO2 23 22 20* 22 20*  GLUCOSE 92 73 106* 89 77  BUN 56* 63* 62* 62* 59*  CREATININE 1.87* 1.87* 1.84* 2.06* 2.15*  CALCIUM 9.2 9.0 8.8* 8.6* 8.5*   GFR: Estimated Creatinine Clearance: 24.4 mL/min (A) (by C-G formula based on SCr of 2.15 mg/dL (H)). Liver Function Tests: No results for input(s): AST, ALT, ALKPHOS, BILITOT, PROT, ALBUMIN in the last 168 hours. No results for input(s): LIPASE, AMYLASE  in the last 168 hours. No results for input(s): AMMONIA in the last 168 hours. Coagulation Profile: No results for input(s): INR, PROTIME in the last 168 hours. Cardiac Enzymes: No results for input(s): CKTOTAL, CKMB, CKMBINDEX, TROPONINI in the last 168 hours. BNP (last 3 results) No results for input(s): PROBNP in the last 8760 hours. HbA1C: No results for input(s): HGBA1C in the last 72 hours. CBG: Recent Labs  Lab 01/31/21 0722 01/31/21 1144 01/31/21 1644 01/31/21 2105 02/01/21 0817  GLUCAP 82 121* 124* 111* 71   Lipid Profile: No results for input(s): CHOL, HDL, LDLCALC, TRIG, CHOLHDL, LDLDIRECT in the last 72 hours. Thyroid Function Tests: No results for input(s): TSH, T4TOTAL, FREET4, T3FREE, THYROIDAB in the last 72 hours. Anemia Panel: No results for input(s): VITAMINB12, FOLATE, FERRITIN, TIBC, IRON, RETICCTPCT in the last 72 hours. Sepsis Labs: No results for input(s): PROCALCITON, LATICACIDVEN in the last 168 hours.  Recent Results (from the past 240 hour(s))  Aerobic/Anaerobic Culture (surgical/deep wound)     Status: Abnormal   Collection Time: 01/24/21  1:08 PM   Specimen: Abscess  Result Value Ref Range Status   Specimen Description   Final    ABSCESS Performed at [facility] [address], [place],  [id number]    Special Requests   Final    NONE Performed at [facility], [place] [address], [place], [place] [address]    Gram Stain   Final    MODERATE WBC PRESENT, PREDOMINANTLY PMN MODERATE GRAM POSITIVE COCCI IN PAIRS IN CLUSTERS FEW GRAM POSITIVE RODS RARE GRAM NEGATIVE RODS    Culture (A)  Final    MULTIPLE ORGANISMS PRESENT, NONE PREDOMINANT NO ANAEROBES ISOLATED Performed at [facility],  [address], [place], [place] [address]    Report Status 01/30/2021 FINAL  Final     Radiology Studies: No results found.   LOS: 12 days   Antonieta Pert, MD [facility]  02/01/2021, 10:28 AM

## 2021-02-02 DIAGNOSIS — K5720 Diverticulitis of large intestine with perforation and abscess without bleeding: Secondary | ICD-10-CM

## 2021-02-02 DIAGNOSIS — G9341 Metabolic encephalopathy: Secondary | ICD-10-CM | POA: Diagnosis not present

## 2021-02-02 DIAGNOSIS — Z7189 Other specified counseling: Secondary | ICD-10-CM | POA: Diagnosis not present

## 2021-02-02 DIAGNOSIS — J9601 Acute respiratory failure with hypoxia: Secondary | ICD-10-CM | POA: Diagnosis not present

## 2021-02-02 DIAGNOSIS — L8990 Pressure ulcer of unspecified site, unspecified stage: Secondary | ICD-10-CM | POA: Insufficient documentation

## 2021-02-02 LAB — CBC
HCT: 31.5 % — ABNORMAL LOW (ref 36.0–46.0)
Hemoglobin: 9.1 g/dL — ABNORMAL LOW (ref 12.0–15.0)
MCH: 32.4 pg (ref 26.0–34.0)
MCHC: 28.9 g/dL — ABNORMAL LOW (ref 30.0–36.0)
MCV: 112.1 fL — ABNORMAL HIGH (ref 80.0–100.0)
Platelets: 90 10*3/uL — ABNORMAL LOW (ref 150–400)
RBC: 2.81 MIL/uL — ABNORMAL LOW (ref 3.87–5.11)
RDW: 24.1 % — ABNORMAL HIGH (ref 11.5–15.5)
WBC: 7 10*3/uL (ref 4.0–10.5)
nRBC: 0 % (ref 0.0–0.2)

## 2021-02-02 LAB — GLUCOSE, CAPILLARY
Glucose-Capillary: 75 mg/dL (ref 70–99)
Glucose-Capillary: 111 mg/dL — ABNORMAL HIGH (ref 70–99)
Glucose-Capillary: 164 mg/dL — ABNORMAL HIGH (ref 70–99)
Glucose-Capillary: 159 mg/dL — ABNORMAL HIGH (ref 70–99)

## 2021-02-02 LAB — BASIC METABOLIC PANEL
Anion gap: 10 (ref 5–15)
BUN: 55 mg/dL — ABNORMAL HIGH (ref 8–23)
CO2: 18 mmol/L — ABNORMAL LOW (ref 22–32)
Calcium: 8.7 mg/dL — ABNORMAL LOW (ref 8.9–10.3)
Chloride: 112 mmol/L — ABNORMAL HIGH (ref 98–111)
Creatinine, Ser: 2.14 mg/dL — ABNORMAL HIGH (ref 0.44–1.00)
GFR, Estimated: 25 mL/min — ABNORMAL LOW (ref >60)
Glucose, Bld: 86 mg/dL (ref 70–99)
Potassium: 3.3 mmol/L — ABNORMAL LOW (ref 3.5–5.1)
Sodium: 140 mmol/L (ref 135–145)

## 2021-02-02 MED ORDER — DRONABINOL 2.5 MG PO CAPS
2.5000 mg | ORAL_CAPSULE | Freq: Two times a day (BID) | ORAL | Status: DC
  Administered 2021-02-02 – 2021-02-04 (×5): 2.5 mg via ORAL
  Filled 2021-02-02 (×5): qty 1

## 2021-02-02 NOTE — Progress Notes (Signed)
Referring Physician(s): [place]  Supervising Physician: Jacqulynn Cadet  Patient Status:  [facility] - In-pt  Chief Complaint: Abdominal pain/fluid collection/abscess   Subjective: Pt awake No new c/o Sitter at bedside Had follow up CT yesterday with resolution of abscess.   Allergies: Infed [iron dextran], Pentamidine, Budesonide-formoterol fumarate, Bupropion, Crestor [rosuvastatin], Duloxetine hcl, Erythromycin [erythromycin], Iohexol, Oxycodone, Pravastatin sodium, Erythromycin, and Ultram [tramadol hcl]  Medications:  Current Facility-Administered Medications:  .  (feeding supplement) PROSource Plus liquid 30 mL, 30 mL, Oral, BID BM, Dessa Phi, DO, 30 mL at 01/31/21 2144 .  0.9 %  sodium chloride infusion, , Intravenous, PRN, Mariel Aloe, MD, Last Rate: 10 mL/hr at 01/28/21 0832, Infusion Verify at 01/28/21 0832 .  0.9 %  sodium chloride infusion, , Intravenous, PRN, Mariel Aloe, MD, Stopped at 01/30/21 0105 .  acetaminophen (TYLENOL) tablet 650 mg, 650 mg, Oral, Q6H PRN, 650 mg at 01/24/21 2158 **OR** acetaminophen (TYLENOL) suppository 650 mg, 650 mg, Rectal, Q6H PRN, Dessa Phi, DO .  allopurinol (ZYLOPRIM) tablet 100 mg, 100 mg, Oral, Daily, Dessa Phi, DO, 100 mg at 02/02/21 1026 .  alum & mag hydroxide-simeth (MAALOX/MYLANTA) 200-200-20 MG/5ML suspension 15 mL, 15 mL, Oral, Q4H PRN, Mariel Aloe, MD, 15 mL at 01/26/21 1306 .  apixaban (ELIQUIS) tablet 5 mg, 5 mg, Oral, BID, Kc, Ramesh, MD, 5 mg at 02/02/21 1026 .  brimonidine (ALPHAGAN) 0.15 % ophthalmic solution 1 drop, 1 drop, Both Eyes, BID, Dessa Phi, DO, 1 drop at 02/02/21 1032 .  ceFEPIme (MAXIPIME) 2 g in sodium chloride 0.9 % 100 mL IVPB, 2 g, Intravenous, Q24H, Adrian Saran, [facility], Last Rate: 200 mL/hr at 02/01/21 1829, 2 g at 02/01/21 1829 .  chlorhexidine (PERIDEX) 0.12 % solution 15 mL, 15 mL, Mouth Rinse, BID, Mariel Aloe, MD, 15 mL at 02/01/21 2145 .  cycloSPORINE  (RESTASIS) 0.05 % ophthalmic emulsion 1 drop, 1 drop, Both Eyes, BID, Dessa Phi, DO, 1 drop at 02/02/21 1031 .  dorzolamide (TRUSOPT) 2 % ophthalmic solution 1 drop, 1 drop, Both Eyes, Daily, Dessa Phi, DO, 1 drop at 02/02/21 1032 .  dronabinol (MARINOL) capsule 2.5 mg, 2.5 mg, Oral, BID AC, Kc, Ramesh, MD .  feeding supplement (ENSURE ENLIVE / ENSURE PLUS) liquid 237 mL, 237 mL, Oral, BID BM, Dessa Phi, DO, 237 mL at 02/02/21 1037 .  FLUoxetine (PROZAC) capsule 40 mg, 40 mg, Oral, Daily, Dessa Phi, DO, 40 mg at 02/02/21 1028 .  HYDROcodone-acetaminophen (NORCO/VICODIN) 5-325 MG per tablet 1-2 tablet, 1-2 tablet, Oral, Q4H PRN, Earnstine Regal, PA-C, 2 tablet at 02/01/21 0039 .  hydrocortisone (ANUSOL-HC) 2.5 % rectal cream, , Rectal, BID, Mariel Aloe, MD, Given at 02/02/21 1033 .  insulin aspart (novoLOG) injection 0-5 Units, 0-5 Units, Subcutaneous, QHS, Dessa Phi, DO, 2 Units at 01/29/21 2135 .  insulin aspart (novoLOG) injection 0-9 Units, 0-9 Units, Subcutaneous, TID WC, Dessa Phi, DO, 1 Units at 01/31/21 1441 .  latanoprost (XALATAN) 0.005 % ophthalmic solution 1 drop, 1 drop, Both Eyes, QHS, Choi, Jennifer, DO, 1 drop at 02/01/21 2144 .  MEDLINE mouth rinse, 15 mL, Mouth Rinse, q12n4p, Mariel Aloe, MD, 15 mL at 01/30/21 1526 .  metoprolol tartrate (LOPRESSOR) tablet 25 mg, 25 mg, Oral, BID, Mariel Aloe, MD, 25 mg at 02/02/21 1029 .  metroNIDAZOLE (FLAGYL) IVPB 500 mg, 500 mg, Intravenous, Q8H, Dessa Phi, DO, Last Rate: 100 mL/hr at 02/02/21 1050, 500 mg at 02/02/21 1050 .  morphine 2  MG/ML injection 2 mg, 2 mg, Intravenous, Q4H PRN, Kc, Ramesh, MD, 2 mg at 02/02/21 0701 .  multivitamin with minerals tablet 1 tablet, 1 tablet, Oral, Daily, Dessa Phi, DO, 1 tablet at 02/02/21 1030 .  mycophenolate (MYFORTIC) EC tablet 180 mg, 180 mg, Oral, BID, Dessa Phi, DO, 180 mg at 02/02/21 1030 .  nutrition supplement (JUVEN) (JUVEN) powder packet 1  packet, 1 packet, Oral, BID BM, Dessa Phi, DO, 1 packet at 02/01/21 1015 .  ondansetron (ZOFRAN) tablet 4 mg, 4 mg, Oral, Q6H PRN **OR** ondansetron (ZOFRAN) injection 4 mg, 4 mg, Intravenous, Q6H PRN, Dessa Phi, DO, 4 mg at 01/31/21 1032 .  predniSONE (DELTASONE) tablet 7.5 mg, 7.5 mg, Oral, Q breakfast, Mariel Aloe, MD, 7.5 mg at 02/02/21 1026 .  prochlorperazine (COMPAZINE) injection 5 mg, 5 mg, Intravenous, Q6H PRN, Kc, Ramesh, MD, 5 mg at 01/31/21 2141 .  promethazine (PHENERGAN) injection 12.5 mg, 12.5 mg, Intravenous, Q6H PRN, Mariel Aloe, MD, 12.5 mg at 02/01/21 1838 .  sodium chloride flush (NS) 0.9 % injection 3 mL, 3 mL, Intravenous, Q12H, Dessa Phi, DO, 3 mL at 02/01/21 2203 .  sodium chloride flush (NS) 0.9 % injection 5 mL, 5 mL, Intracatheter, Q8H, Mir, Paula Libra, MD, 5 mL at 02/02/21 0659 .  tacrolimus (PROGRAF) capsule 1 mg, 1 mg, Oral, BID, Dessa Phi, DO, 1 mg at 02/02/21 1028    Vital Signs: BP (!) 141/83 (BP Location: Left Leg)   Pulse 74   Temp (!) 97.4 F (36.3 C) (Oral)   Resp 17   Ht 5\' 1"  (1.549 m)   Wt 72.2 kg   SpO2 99%   BMI 30.08 kg/m   Physical Exam awake but slightly drowsy.  Left lower quadrant drain intact, insertion site okay, Scant output  Imaging: CT ABDOMEN PELVIS WO CONTRAST  Result Date: 02/01/2021 CLINICAL DATA:  65 year old female with a history of left lower quadrant diverticular abscess status post percutaneous drain placement on 01/24/2021. EXAM: CT ABDOMEN AND PELVIS WITHOUT CONTRAST TECHNIQUE: Multidetector CT imaging of the abdomen and pelvis was performed following the standard protocol without IV contrast. COMPARISON:  Prior CT abdomen/pelvis 01/04/2021 FINDINGS: Lower chest: Small bilateral pleural effusions with associated subsegmental atelectasis. The intracardiac blood pool is hypodense relative to the adjacent myocardium consistent with anemia. No pericardial effusion. Atherosclerotic calcifications  visualized along the coronary arteries. Hepatobiliary: Numerous circumscribed low-attenuation liver lesions, similar compared to prior imaging and likely reflecting simple cysts. High attenuation material layers dependently within the gallbladder lumen consistent with sludge and/or small stones. No biliary ductal dilatation. Pancreas: Unremarkable. No pancreatic ductal dilatation or surrounding inflammatory changes. Spleen: Normal in size without focal abnormality. Adrenals/Urinary Tract: Unremarkable adrenal glands. Innumerable circumscribed low-attenuation cysts throughout both kidneys. 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 Unremarkable ureters and bladder. Transplant kidney in the right lower quadrant without evidence of hydronephrosis. Stomach/Bowel: Colonic diverticular disease without CT evidence of active inflammation. Surgical changes of prior partial left colectomy with Hartmann's pouch and left lower quadrant end colostomy. A percutaneous drainage catheter is present adjacent to the stump of the Hartmann's pouch. Vascular/Lymphatic: Limited evaluation in the absence of intravenous contrast. No evidence of aneurysm. Atherosclerotic calcifications present throughout the abdominal aorta. No suspicious lymphadenopathy. Reproductive: Uterus and bilateral adnexa are unremarkable. Other: Interval resolution of fluid collection from within the left lower quadrant adjacent to the Hartmann's pouch stump. Small volume ascites in the right pericolic gutter. Open midline incision. No evidence of hernia.  Musculoskeletal: No acute osseous abnormality. Surgical changes of prior left total hip arthroplasty. Dependent body wall edema. IMPRESSION: 1. Interval resolution of left lower quadrant abscess collection with well-positioned percutaneous drainage catheter. 2. Small bilateral pleural effusions with associated atelectasis. 3. Small volume free fluid in the right pericolic  gutter. 4. The intracardiac blood pool is hypodense relative to the adjacent myocardium consistent with anemia. 5. Additional ancillary findings as above without significant interval change. Electronically Signed   By: Jacqulynn Cadet M.D.   On: 02/01/2021 11:00    Labs:  CBC: Recent Labs    01/30/21 0248 01/31/21 0555 02/01/21 0546 02/02/21 0825  WBC 6.9 6.5 7.4 7.0  HGB 8.9* 9.0* 9.3* 9.1*  HCT 29.8* 30.9* 31.1* 31.5*  PLT 128* 110* 93* 90*    COAGS: Recent Labs    01/21/21 0239 01/21/21 1000 01/22/21 0258 01/23/21 0248 01/23/21 1304 01/24/21 0252 01/25/21 0523 01/25/21 1458 01/26/21 0145  INR 3.1*  --  2.2* 1.6*  --  1.9*  --   --   --   APTT 133*   < > 68* 57*   < > 61* 56* 56* 105*   < > = values in this interval not displayed.    BMP: Recent Labs    07/01/20 2334 07/02/20 0030 07/03/20 0350 07/18/20 1545 08/05/20 1523 09/02/20 2049 01/30/21 0248 01/31/21 0555 02/01/21 0546 02/02/21 0825  NA 135   < > 139 131* 140   < > 139 140 139 140  K 4.4   < > 5.1 4.7 4.5   < > 3.6 3.3* 3.8 3.3*  CL 102   < > 108 95* 106   < > 107 107 107 112*  CO2 25  --  20* 24 27   < > 20* 22 20* 18*  GLUCOSE 135*   < > 187* 187* 189*   < > 106* 89 77 86  BUN 28*   < > 42* 30* 45*   < > 62* 62* 59* 55*  CALCIUM 9.7  --  9.0 9.4 10.1   < > 8.8* 8.6* 8.5* 8.7*  CREATININE 1.02*   < > 1.17* 1.39* 1.30*   < > 1.84* 2.06* 2.15* 2.14*  GFRNONAA 58*  --  50* 40* 44*   < > 30* 27* 25* 25*  GFRAA >60  --  57* 47* 51*  --   --   --   --   --    < > = values in this interval not displayed.    LIVER FUNCTION TESTS: Recent Labs    01/03/21 0300 01/04/21 0556 01/08/21 1021 01/09/21 0511 01/12/21 0250 01/13/21 0049 01/02/2021 1339 01/22/21 0258  BILITOT 1.4*  --  1.5*  --   --   --  1.2 1.1  AST 11*  --  10*  --   --   --  25 20  ALT 11  --  8  --   --   --  8 9  ALKPHOS 53  --  62  --   --   --  94 80  PROT 5.6*  --  5.4*  --   --   --  6.0* 5.7*  ALBUMIN 2.3*   < > 2.3*   <  > 1.9* 2.0* 1.9* 2.1*   < > = values in this interval not displayed.    Assessment and Plan: Pt with hx perf sig diverticulitis, s/p expl lap with sig colectomy/end colostomy 12/29/20; post op left  lower abd fluid coll/abscess, s/p drain placement 3/4 CT 3/11 looks good, resolution of abscess. Will try to bring down tomorrow for drain injection and possible removal.  Electronically Signed: Ascencion Dike, PA-C 02/02/2021, 2:19 PM   I spent a total of 15 minutes at the the patient's bedside AND on the patient's hospital floor or unit, greater than 50% of which was counseling/coordinating care for left lower abdominal abscess drain

## 2021-02-02 NOTE — Progress Notes (Signed)
   Daily Progress Note   Patient Name: April [name]       Date: 02/02/2021 DOB: [birth year]  Age: 65 y.o. MRN#: [id number] Attending Physician: Antonieta Pert, MD Primary Care Physician: [name], MD Admit Date: 01/12/2021  Reason for Consultation/Follow-up: Establishing goals of care  Subjective: Chart Reviewed and Updates Received.   I met with April [name] today.  She was lying in bed in no distress.  She was awake and pleasant during conversation.  We discussed again her desire only to be at home at time of discharge.  She again stated that she is, "not ready to give up."  We talked concern moving forward regarding her nutritional status.  Dr. Lupita Leash and I discussed earlier today and patient's daughter and asked about potential for PEG tube.  I asked April [name] her thoughts on this today and she told me that this is not something she sure she wants.  She feels that she needs further input from her daughter regarding potential care options moving forward.  She is planning to speak with her later today or tomorrow.  Length of Stay: 13 days  Vital Signs: BP (!) 141/83 (BP Location: Left Leg)   Pulse 74   Temp (!) 97.4 F (36.3 C) (Oral)   Resp 17   Ht _0  (1.549 m)   Wt 72.2 kg   SpO2 99%   BMI 30.08 kg/m  SpO2: SpO2: 99 % O2 Device: O2 Device: Room Air O2 Flow Rate: O2 Flow Rate (L/min): 2 L/min  Physical Exam: Resting, easily aroused Normal respiratory efforts RRR A&O x3            Palliative Care Assessment & Plan    Code Status:  DNR  Goals of Care/Recommendations:  DNR/DNI  Discussed with Dr. Lupita Leash and family asking about potential for PEG tube.  I was unable to discuss with her daughter today (patient reports she is working) but April [name] tells me she is not sure this is something she would want.  She feels she needs to discuss further with her daughter prior to making any decisions.  I encouraged her to discuss what she wants moving forward with her daughter  and let her know that I am available as needed to help answer any questions moving forward.  PMT will continue to support and follow as needed. Please contact team line for urgent needs.   Prognosis: Guarded   Discharge Planning: To Be Determined  Thank you for allowing the Palliative Medicine Team to assist in the care of this patient.  Time Total: 20 min.   Visit consisted of counseling and education dealing with the complex and emotionally intense issues of symptom management and palliative care in the setting of serious and potentially life-threatening illness.Greater than 50%  of this time was spent counseling and coordinating care related to the above assessment and plan.  Micheline Rough, MD [facility] Team [phone]

## 2021-02-02 NOTE — Progress Notes (Signed)
No acute surgical issues today.  We will follow up tomorrow.

## 2021-02-02 NOTE — Progress Notes (Signed)
PROGRESS NOTE    April [name]  SVX:[id number] DOB: 04/21/1956 DOA: 01/03/2021 PCP: [name], MD   Chief Complaint  Patient presents with  . Hypotension  . hypoxia   Brief Narrative: 65 year old female with medical history of A flutter status post, chronic respiratory failure on 2 L oxygen, asthma, OSA, CKD stage IIIa, ESRD post transplant, diastolic heart failure, diabetes presented secondary to acute encephalopathy with associated hypotension in setting of intraabdominal fluid collection and recent intraabdominal surgery. General surgery and IR was consulted for management. Patient managed empirically on antibiotics. Found to have C. Difficile and started on Vancomycin PO.  Status post IR drain placement 3/4-being managed by IR, general surgery.  Patient has colostomy and lower abdominal wall wound and managed by dressing changes. Patient remains very weak deconditioned with poor oral intake.  She is at risk of decompensation.  Palliative care on board she is DNR.  Subjective: Seen and examined.  Daughter is at the bedside. Afebrile overnight temperature 97.4  Saturating well blood pressure stable  Labs pending this morning  With ongoing deconditioning poor oral intake. Reports some nausea yesterday but poor appetite.  Assessment & Plan: Intra-abdominal fluid/abscess, adjacent to enterotomy staple line History of perforated sigmoid diverticulitis Status post ex lap with sigmoid colectomy/end colostomy 12/29/2020 by Dr. Ninfa Linden IR drain placement 3/4 -culture with multiple organisms-none predominant no anaerobes. Drain being managed by IR and colostomy/dressing managed by surgery. CT abdomen 311 shows interval resolution of left lower quadrant abscess collection with catheter in place hopefully can remove defer to IR. Remains on Cefepime, Flagyl day #13 and has been afebrile and no leukocytosis-hopefully can stop antibiotics-await for surgery recommendation tomorrow. Tolerating  dysphagia 3 diet but has had nausea and poor appetite-add marinol po to stimulate appetite.  C. difficile colitis positive antigen/toxin 3/1: Mild symptoms, on oral vancomycin course 12/26/28- course complicated 10 days. No diarrhea on colostomy.  Acute on chronic diastolic CHF/Anasarca Appears stable volume wise appearing on the dry side with worsening creatinine and gentle IV fluid was started on 3/12. Off lasix 01/31/21. Net IO Since Admission: -4,090.79 mL [02/02/21 1014]  Polycystic kidney disease ESRD hx s/p renal transplant 2013 on immunosuppressant and chronic steroid AKI on CKD stage III: Creatinine was uptrending -stopped her Lasix 3/11.  Since her p.o. intake is poor appeared dry started on gentle ivf- NS 50 ml/hr- BMP with stable creat same- keep on ivf overnight.Continue her Prograf, cellcept,and prednisone. Recent Labs  Lab 01/29/21 0244 01/30/21 0248 01/31/21 0555 02/01/21 0546 02/02/21 0825  BUN 63* 62* 62* 59* 55*  CREATININE 1.87* 1.84* 2.06* 2.15* 2.14*   History of adrenal insufficiency on her prednisone 7.5 mg.  Acute on chronic respiratory failure with hypoxia: Respiratory status is stable on home setting 2 L, continue to encourage IS, PT OT as tolerated.    Acute metabolic encephalopathy: Seems to have improved.  Alert awake but deconditioned  Hypotension received normal saline bolus previously.  Stable blood pressure.   Acute urine retention:?  Etiology Foley placed on admission.  On Foley now since stopping Lasix hopefully we can do voiding trial, if creatinine improving  Acute on chronic anemia/Normocytic anemia/Iron deficiency anemia Large hematoma Left arm drained and dressed Anemia in the setting of recent abdominal surgery left arm hematoma.  Overall stable.  Monitor closely.  Recent Labs  Lab 01/29/21 0244 01/30/21 0248 01/31/21 0555 02/01/21 0546 02/02/21 0825  HGB 9.6* 8.9* 9.0* 9.3* 9.1*  HCT 31.9* 29.8* 30.9* 31.1* 31.5*   Typical  atrial  flutter, TEE/cardioversion 2/6 and in sinus and has been on Eliquis, on IV heparin this admission, on metoprolol 25 mg twice daily.  Heparin was discontinued and patient was placed back on Eliquis after cleared by surgery 3/11.   T2DM: Blood sugar w/ a episode of hypoglycemia 65 on 3/9 am-CBG was low due to poor intake and  discontinued Lantus altogether 3/12- cont ssi and monitor cbg Recent Labs  Lab 02/01/21 0817 02/01/21 1217 02/01/21 1714 02/01/21 2110 02/02/21 0802  GLUCAP 71 87 108* 811* 75   Metabolic acidosis with low bicarb.  Resolved. Depression continue her Prozac appears depressed and deconditioned  Cont her allopurinol,  Goals of care: DNR.Palliative care following.  Given ocmplex comorbidities, prognosis appears guarded, at risk of decompensation. she is very deconditioned and frail and with poor appttite-SNF has been recommended but patient and family at this time wanting to return home.  Diet Order            DIET DYS 3 Room service appropriate? Yes with Assist; Fluid consistency: Thin  Diet effective now                 Nutrition Problem: Increased nutrient needs Etiology: acute illness,wound healing Signs/Symptoms: estimated needs Interventions: Juven,Magic cup,Ensure Enlive (each supplement provides 350kcal and 20 grams of protein),Prostat Patient's Body mass index is 30.08 kg/m.  DVT prophylaxis: Heprain iv Code Status:   Code Status: DNR Family Communication: plan of care discussed with patient. Discussed with the daughter at the bedside   Status is: Inpatient Remains inpatient appropriate because:IV treatments appropriate due to intensity of illness or inability to take PO and Inpatient level of care appropriate due to severity of illness  Dispo: The patient is from: Home              Anticipated d/c is to: Home in 2 days              Patient currently is not medically stable to d/c.   Difficult to place patient No Unresulted Labs (From admission,  onward)          Start     Ordered   02/03/21 0500  CBC  Daily,   R     Question:  Specimen collection method  Answer:  Lab=Lab collect   02/02/21 0746   02/03/21 9147  Basic metabolic panel  Daily,   R     Question:  Specimen collection method  Answer:  Lab=Lab collect   02/02/21 0746         Medications reviewed:  Scheduled Meds: . (feeding supplement) PROSource Plus  30 mL Oral BID BM  . allopurinol  100 mg Oral Daily  . apixaban  5 mg Oral BID  . brimonidine  1 drop Both Eyes BID  . chlorhexidine  15 mL Mouth Rinse BID  . cycloSPORINE  1 drop Both Eyes BID  . dorzolamide  1 drop Both Eyes Daily  . dronabinol  2.5 mg Oral BID AC  . feeding supplement  237 mL Oral BID BM  . FLUoxetine  40 mg Oral Daily  . hydrocortisone   Rectal BID  . insulin aspart  0-5 Units Subcutaneous QHS  . insulin aspart  0-9 Units Subcutaneous TID WC  . latanoprost  1 drop Both Eyes QHS  . mouth rinse  15 mL Mouth Rinse q12n4p  . metoprolol tartrate  25 mg Oral BID  . multivitamin with minerals  1 tablet Oral Daily  . mycophenolate  180 mg Oral BID  . nutrition supplement (JUVEN)  1 packet Oral BID BM  . predniSONE  7.5 mg Oral Q breakfast  . sodium chloride flush  3 mL Intravenous Q12H  . sodium chloride flush  5 mL Intracatheter Q8H  . tacrolimus  1 mg Oral BID   Continuous Infusions: . sodium chloride 10 mL/hr at 01/28/21 0832  . sodium chloride Stopped (01/30/21 0105)  . sodium chloride 50 mL/hr at 02/01/21 1028  . ceFEPime (MAXIPIME) IV 2 g (02/01/21 1829)  . metronidazole 500 mg (02/01/21 2349)    Consultants:see note  Procedures:see note  Antimicrobials: Anti-infectives (From admission, onward)   Start     Dose/Rate Route Frequency Ordered Stop   01/21/21 2200  vancomycin (VANCOCIN) 50 mg/mL oral solution 125 mg        125 mg Oral 4 times daily 01/21/21 2028 01/31/21 2159   01/21/21 1800  ceFEPIme (MAXIPIME) 2 g in sodium chloride 0.9 % 100 mL IVPB        2 g 200 mL/hr over  30 Minutes Intravenous Every 24 hours 01/19/2021 1742     01/21/21 0000  metroNIDAZOLE (FLAGYL) IVPB 500 mg        500 mg 100 mL/hr over 60 Minutes Intravenous Every 8 hours 01/03/2021 1821     12/28/2020 1800  vancomycin (VANCOCIN) IVPB 1000 mg/200 mL premix  Status:  Discontinued        1,000 mg 200 mL/hr over 60 Minutes Intravenous Every 48 hours 01/08/2021 1742 01/22/21 1053   01/11/2021 1545  ceFEPIme (MAXIPIME) 2 g in sodium chloride 0.9 % 100 mL IVPB        2 g 200 mL/hr over 30 Minutes Intravenous  Once 12/26/2020 1544 01/08/2021 1625   01/16/2021 1545  metroNIDAZOLE (FLAGYL) IVPB 500 mg        500 mg 100 mL/hr over 60 Minutes Intravenous  Once 12/24/2020 1544 12/25/2020 1730   01/12/2021 1545  vancomycin (VANCOCIN) IVPB 1000 mg/200 mL premix  Status:  Discontinued        1,000 mg 200 mL/hr over 60 Minutes Intravenous  Once 01/19/2021 1544 01/05/2021 1847     Culture/Microbiology    Component Value Date/Time   SDES  01/24/2021 1308    ABSCESS Performed at [facility], [place] [address], [place], [place] [address]    SPECREQUEST  01/24/2021 1308    NONE Performed at [facility], [place] [address] [place], [place] [address]    CULT (A) 01/24/2021 1308    MULTIPLE ORGANISMS PRESENT, NONE PREDOMINANT NO ANAEROBES ISOLATED Performed at [facility], [place] [address], [place], [place] [address]    REPTSTATUS 01/30/2021 FINAL 01/24/2021 1308    Other culture-see note  Objective: Vitals: Today's Vitals   02/01/21 2156 02/02/21 0607 02/02/21 0726 02/02/21 0810  BP:  (!) 141/83    Pulse:  74    Resp:  17    Temp:  (!) 97.4 F (36.3 C)    TempSrc:  Oral    SpO2:  99%    Weight:      Height:      PainSc: 0-No pain  Asleep 0-No pain    Intake/Output Summary (Last 24 hours) at 02/02/2021 1014 Last data filed at 02/02/2021 0713 Gross per 24 hour  Intake 572.91 ml  Output 595 ml  Net -22.09 ml   Filed Weights   01/29/21 0500 01/30/21 0433 01/31/21 0624   Weight: 70.3 kg 72 kg 72.2  kg   Weight change:   Intake/Output from previous day: 03/12 0701 - 03/13 0700 In: 313.7 [I.V.:213.7; IV Piggyback:100] Out: 510 [Urine:500; Drains:10] Intake/Output this shift: Total I/O In: 259.2 [I.V.:259.2] Out: 85 [Drains:10; Stool:75] Filed Weights   01/29/21 0500 01/30/21 0433 01/31/21 0624  Weight: 70.3 kg 72 kg 72.2 kg    Examination:  General exam: AAO, ill looking and frail, older than his stated age  HEENT:Oral mucosa moist, Ear/Nose WNL grossly, dentition normal. Respiratory system: bilaterally diminishedd,no wheezing or crackles,no use of accessory muscle Cardiovascular system: S1 & S2 +, No JVD,. Gastrointestinal system: Abdomen soft, lateral area with wound with dressing intact clean dry, colostomy with semisolid stool, JP drain intact, ND, no significant tenderness, BS+ Nervous System:Alert, awake, moving extremities and grossly nonfocal Extremities: trace edema, distal peripheral pulses palpable.  Skin: No rashes,no icterus. MSK: Normal muscle bulk,tone, power  Data Reviewed: I have personally reviewed following labs and imaging studies CBC: Recent Labs  Lab 01/29/21 0244 01/30/21 0248 01/31/21 0555 02/01/21 0546 02/02/21 0825  WBC 6.6 6.9 6.5 7.4 7.0  HGB 9.6* 8.9* 9.0* 9.3* 9.1*  HCT 31.9* 29.8* 30.9* 31.1* 31.5*  MCV 105.6* 106.8* 108.4* 107.6* 112.1*  PLT 140* 128* 110* 93* 90*   Basic Metabolic Panel: Recent Labs  Lab 01/29/21 0244 01/30/21 0248 01/31/21 0555 02/01/21 0546 02/02/21 0825  NA 139 139 140 139 140  K 3.6 3.6 3.3* 3.8 3.3*  CL 108 107 107 107 112*  CO2 22 20* 22 20* 18*  GLUCOSE 73 106* 89 77 86  BUN 63* 62* 62* 59* 55*  CREATININE 1.87* 1.84* 2.06* 2.15* 2.14*  CALCIUM 9.0 8.8* 8.6* 8.5* 8.7*   GFR: Estimated Creatinine Clearance: 24.5 mL/min (A) (by C-G formula based on SCr of 2.14 mg/dL (H)). Liver Function Tests: No results for input(s): AST, ALT, ALKPHOS, BILITOT, PROT, ALBUMIN in the  last 168 hours. No results for input(s): LIPASE, AMYLASE in the last 168 hours. No results for input(s): AMMONIA in the last 168 hours. Coagulation Profile: No results for input(s): INR, PROTIME in the last 168 hours. Cardiac Enzymes: No results for input(s): CKTOTAL, CKMB, CKMBINDEX, TROPONINI in the last 168 hours. BNP (last 3 results) No results for input(s): PROBNP in the last 8760 hours. HbA1C: No results for input(s): HGBA1C in the last 72 hours. CBG: Recent Labs  Lab 02/01/21 0817 02/01/21 1217 02/01/21 1714 02/01/21 2110 02/02/21 0802  GLUCAP 71 87 108* 121* 75   Lipid Profile: No results for input(s): CHOL, HDL, LDLCALC, TRIG, CHOLHDL, LDLDIRECT in the last 72 hours. Thyroid Function Tests: No results for input(s): TSH, T4TOTAL, FREET4, T3FREE, THYROIDAB in the last 72 hours. Anemia Panel: No results for input(s): VITAMINB12, FOLATE, FERRITIN, TIBC, IRON, RETICCTPCT in the last 72 hours. Sepsis Labs: No results for input(s): PROCALCITON, LATICACIDVEN in the last 168 hours.  Recent Results (from the past 240 hour(s))  Aerobic/Anaerobic Culture (surgical/deep wound)     Status: Abnormal   Collection Time: 01/24/21  1:08 PM   Specimen: Abscess  Result Value Ref Range Status   Specimen Description   Final    ABSCESS Performed at [facility] [address], [place], [place] [address]    Special Requests   Final    NONE Performed at [facility], [place] [address], [place], [place] [address]    Gram Stain   Final    MODERATE WBC PRESENT, PREDOMINANTLY PMN MODERATE GRAM POSITIVE COCCI IN PAIRS IN CLUSTERS FEW GRAM POSITIVE  RODS RARE GRAM NEGATIVE RODS    Culture (A)  Final    MULTIPLE ORGANISMS PRESENT, NONE PREDOMINANT NO ANAEROBES ISOLATED Performed at [facility], [place] [address], [place], [place] [address]    Report Status 01/30/2021 FINAL  Final     Radiology Studies: CT ABDOMEN PELVIS WO CONTRAST  Result  Date: 02/01/2021 CLINICAL DATA:  65 year old female with a history of left lower quadrant diverticular abscess status post percutaneous drain placement on 01/24/2021. EXAM: CT ABDOMEN AND PELVIS WITHOUT CONTRAST TECHNIQUE: Multidetector CT imaging of the abdomen and pelvis was performed following the standard protocol without IV contrast. COMPARISON:  Prior CT abdomen/pelvis 12/31/2020 FINDINGS: Lower chest: Small bilateral pleural effusions with associated subsegmental atelectasis. The intracardiac blood pool is hypodense relative to the adjacent myocardium consistent with anemia. No pericardial effusion. Atherosclerotic calcifications visualized along the coronary arteries. Hepatobiliary: Numerous circumscribed low-attenuation liver lesions, similar compared to prior imaging and likely reflecting simple cysts. High attenuation material layers dependently within the gallbladder lumen consistent with sludge and/or small stones. No biliary ductal dilatation. Pancreas: Unremarkable. No pancreatic ductal dilatation or surrounding inflammatory changes. Spleen: Normal in size without focal abnormality. Adrenals/Urinary Tract: Unremarkable adrenal glands. Innumerable circumscribed low-attenuation cysts throughout both kidneys. 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 Unremarkable ureters and bladder. Transplant kidney in the right lower quadrant without evidence of hydronephrosis. Stomach/Bowel: Colonic diverticular disease without CT evidence of active inflammation. Surgical changes of prior partial left colectomy with Hartmann's pouch and left lower quadrant end colostomy. A percutaneous drainage catheter is present adjacent to the stump of the Hartmann's pouch. Vascular/Lymphatic: Limited evaluation in the absence of intravenous contrast. No evidence of aneurysm. Atherosclerotic calcifications present throughout the abdominal aorta. No suspicious lymphadenopathy. Reproductive:  Uterus and bilateral adnexa are unremarkable. Other: Interval resolution of fluid collection from within the left lower quadrant adjacent to the Hartmann's pouch stump. Small volume ascites in the right pericolic gutter. Open midline incision. No evidence of hernia. Musculoskeletal: No acute osseous abnormality. Surgical changes of prior left total hip arthroplasty. Dependent body wall edema. IMPRESSION: 1. Interval resolution of left lower quadrant abscess collection with well-positioned percutaneous drainage catheter. 2. Small bilateral pleural effusions with associated atelectasis. 3. Small volume free fluid in the right pericolic gutter. 4. The intracardiac blood pool is hypodense relative to the adjacent myocardium consistent with anemia. 5. Additional ancillary findings as above without significant interval change. Electronically Signed   By: Jacqulynn Cadet M.D.   On: 02/01/2021 11:00     LOS: 13 days   Antonieta Pert, MD [facility]  02/02/2021, 10:14 AM

## 2021-02-02 NOTE — Consult Note (Signed)
[facility] Nurse Consult Note: Reason for Consult: Asked by provider to reassess patient's sacral/coccygeal wound. Wound was present last admission and present on readmission on 01/08/2021 as a Stage 2 PI.  A small area of increased depth is noted today in the center of the wound measuring 0.5cm, changing staging to Stage 3. Wound type:pressure Pressure Injury POA: Yes Measurement: 1.5cm x 1cm x 0.5cm Wound bed:red, moist Drainage (amount, consistency, odor) scant serous Periwound: intact Dressing procedure/placement/frequency: Currently a silicone foam is the dressing, but with increased depth, even in a small area I will add a wound contact layer (xeroform) that is antimicrobial and nonadherent. This will be topped with a dry gauze dressing and covered with the silicone foam.  Patient is on a mattress replacement and is wearing Prevalon boots for pressure redistribution.  The Bedside RN, K. Domingo Cocking reports that the patient is often refusing her protein supplements. She does cooperate with most turning and repositioning efforts.  [facility] nursing team will not follow, but will remain available to this patient, the nursing and medical teams.  Please re-consult if needed. Thanks, Maudie Flakes, MSN, RN, [place], [facility]  Pager# [phone]

## 2021-02-03 ENCOUNTER — Inpatient Hospital Stay (HOSPITAL_COMMUNITY): Payer: Medicare Other

## 2021-02-03 DIAGNOSIS — K651 Peritoneal abscess: Secondary | ICD-10-CM | POA: Diagnosis not present

## 2021-02-03 DIAGNOSIS — G9341 Metabolic encephalopathy: Secondary | ICD-10-CM | POA: Diagnosis not present

## 2021-02-03 HISTORY — PX: IR SINUS/FIST TUBE CHK-NON GI: IMG673

## 2021-02-03 LAB — CBC
HCT: 29.4 % — ABNORMAL LOW (ref 36.0–46.0)
Hemoglobin: 8.5 g/dL — ABNORMAL LOW (ref 12.0–15.0)
MCH: 32.4 pg (ref 26.0–34.0)
MCHC: 28.9 g/dL — ABNORMAL LOW (ref 30.0–36.0)
MCV: 112.2 fL — ABNORMAL HIGH (ref 80.0–100.0)
Platelets: 80 10*3/uL — ABNORMAL LOW (ref 150–400)
RBC: 2.62 MIL/uL — ABNORMAL LOW (ref 3.87–5.11)
RDW: 24.4 % — ABNORMAL HIGH (ref 11.5–15.5)
WBC: 4.8 10*3/uL (ref 4.0–10.5)
nRBC: 0 % (ref 0.0–0.2)

## 2021-02-03 LAB — GLUCOSE, CAPILLARY
Glucose-Capillary: 84 mg/dL (ref 70–99)
Glucose-Capillary: 121 mg/dL — ABNORMAL HIGH (ref 70–99)
Glucose-Capillary: 143 mg/dL — ABNORMAL HIGH (ref 70–99)
Glucose-Capillary: 178 mg/dL — ABNORMAL HIGH (ref 70–99)

## 2021-02-03 LAB — BASIC METABOLIC PANEL
Anion gap: 12 (ref 5–15)
BUN: 53 mg/dL — ABNORMAL HIGH (ref 8–23)
CO2: 17 mmol/L — ABNORMAL LOW (ref 22–32)
Calcium: 8.7 mg/dL — ABNORMAL LOW (ref 8.9–10.3)
Chloride: 113 mmol/L — ABNORMAL HIGH (ref 98–111)
Creatinine, Ser: 2 mg/dL — ABNORMAL HIGH (ref 0.44–1.00)
GFR, Estimated: 28 mL/min — ABNORMAL LOW (ref >60)
Glucose, Bld: 110 mg/dL — ABNORMAL HIGH (ref 70–99)
Potassium: 3.5 mmol/L (ref 3.5–5.1)
Sodium: 142 mmol/L (ref 135–145)

## 2021-02-03 MED ORDER — VANCOMYCIN 50 MG/ML ORAL SOLUTION
125.0000 mg | Freq: Two times a day (BID) | ORAL | Status: DC
  Administered 2021-02-03 – 2021-02-04 (×3): 125 mg via ORAL
  Filled 2021-02-03 (×4): qty 2.5

## 2021-02-03 MED ORDER — IOHEXOL 300 MG/ML  SOLN
50.0000 mL | Freq: Once | INTRAMUSCULAR | Status: AC | PRN
  Administered 2021-02-03: 10 mL

## 2021-02-03 MED ORDER — SODIUM CHLORIDE 0.9 % IV SOLN
12.5000 mg | Freq: Four times a day (QID) | INTRAVENOUS | Status: DC | PRN
  Filled 2021-02-03 (×2): qty 0.5

## 2021-02-03 NOTE — Consult Note (Signed)
[place] [facility]  Total days of antibiotics 15               Reason for Consult:polymicrobial intra-abdominal infection s/p drain    Referring Physician: general surgery  Principal Problem:   Metabolic encephalopathy Active Problems:   Kidney transplant status   Depression   Acute respiratory failure with hypoxia (HCC)   Acute on chronic diastolic CHF (congestive heart failure) (HCC)   Chronic obstructive pulmonary disease ([place])   History of immunosuppressive therapy   Diverticulitis with perforation and abscess s/p Hartmann colectomy/colostomy 12/28/2020   Acute renal failure superimposed on stage 3b chronic kidney disease (HCC)   Hypotension   Polycystic kidney disease, autosomal dominant   Colitis due to Clostridioides difficile   Chronic anticoagulation   Colostomy in place [facility])   Acute deep vein thrombosis (DVT) of popliteal vein of right lower extremity (HCC)   Pressure injury of skin    HPI: April [name] is a 65 y.o. 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 Today underwent IR procedure that confirmed she has a colonic fistula. cx were non-specific, polymicrobial, though no PsA isolated. She remains on broad spectrum. IR recommending to keep drain in place. She remains to be afebrile but poor po intake.In additon, abdominal wall incision on admit was dehisced with fibrinous debris in the wound bed that is has improved since admit. Also hx of cdifficile during this hospitalization.  Past Medical History:  Diagnosis Date  . Arthritis   . Asthma   . CHF (congestive heart failure) ([place])   . COPD (chronic obstructive pulmonary disease) ([place])   . Depression   . Diabetes mellitus type 2, uncomplicated ([place]) 0/07/4708  . Essential hypertension 02/08/2020  . GERD (gastroesophageal reflux  disease)   . Hyperlipidemia   . Hyperparathyroidism   . Polycystic kidney   . PONV (postoperative nausea and vomiting)     Allergies:  Allergies  Allergen Reactions  . Infed [Iron Dextran] Other (See Comments)    Chest tightness  . Pentamidine Itching, Shortness Of Breath and Swelling  . Budesonide-Formoterol Fumarate     Other reaction(s): hoarseness  . Bupropion     Other reaction(s): exacerbated depression  . Crestor [Rosuvastatin]     Other reaction(s): body aches, swelling  . Duloxetine Hcl     Other reaction(s): GI SE, weepy, worsening fatigue, foggy  . Erythromycin [Erythromycin] Other (See Comments)    Mouth Ulcers  . Iohexol Other (See Comments)    Per patient "she has had a kidney transplant and should never have contrast"  . Oxycodone Nausea Only and Nausea And Vomiting  . Pravastatin Sodium     Other reaction(s): myalgias  . Erythromycin Rash    Causes breakout in mouth  . Ultram [Tramadol Hcl] Anxiety    MEDICATIONS: . (feeding supplement) PROSource Plus  30 mL Oral BID BM  . allopurinol  100 mg Oral Daily  . apixaban  5 mg Oral BID  . brimonidine  1 drop Both Eyes BID  . chlorhexidine  15 mL Mouth Rinse BID  . cycloSPORINE  1 drop Both Eyes BID  . dorzolamide  1 drop Both Eyes Daily  . dronabinol  2.5 mg Oral BID AC  . feeding supplement  237 mL Oral BID BM  . FLUoxetine  40 mg Oral Daily  . hydrocortisone  Rectal BID  . insulin aspart  0-5 Units Subcutaneous QHS  . insulin aspart  0-9 Units Subcutaneous TID WC  . latanoprost  1 drop Both Eyes QHS  . mouth rinse  15 mL Mouth Rinse q12n4p  . metoprolol tartrate  25 mg Oral BID  . multivitamin with minerals  1 tablet Oral Daily  . mycophenolate  180 mg Oral BID  . nutrition supplement (JUVEN)  1 packet Oral BID BM  . predniSONE  7.5 mg Oral Q breakfast  . sodium chloride flush  3 mL Intravenous Q12H  . tacrolimus  1 mg Oral BID  . vancomycin  125 mg Oral Q12H    Social History   Tobacco Use   . Smoking status: Former Smoker    Packs/day: 0.25    Years: 15.00    Pack years: 3.75    Types: Cigarettes    Quit date: 03/22/1998    Years since quitting: 22.8  . Smokeless tobacco: Never Used  Vaping Use  . Vaping Use: Never used  Substance Use Topics  . Alcohol use: No  . Drug use: No    Family History  Problem Relation Age of Onset  . Hypertension Mother   . Heart disease Father        CABG history  . Polycystic kidney disease Father   . Polycystic kidney disease Brother     Review of Systems -  Review of Systems  Constitutional: Negative for fever, chills, diaphoresis, activity change, appetite change, fatigue and unexpected weight change.  HENT: Negative for congestion, sore throat, rhinorrhea, sneezing, trouble swallowing and sinus pressure.  Eyes: Negative for photophobia and visual disturbance.  Respiratory: Negative for cough, chest tightness, shortness of breath, wheezing and stridor.  Cardiovascular: Negative for chest pain, palpitations and leg swelling.  Gastrointestinal:+abdominal wound. Negative for nausea, vomiting, abdominal pain, diarrhea, constipation, blood in stool, abdominal distention and anal bleeding.  Genitourinary: Negative for dysuria, hematuria, flank pain and difficulty urinating.  Musculoskeletal: Negative for myalgias, back pain, joint swelling, arthralgias and gait problem.  Skin: Negative for color change, pallor, rash and wound.  Neurological: Negative for dizziness, tremors, weakness and light-headedness.  Hematological: Negative for adenopathy. Does not bruise/bleed easily.  Psychiatric/Behavioral: Negative for behavioral problems, confusion, sleep disturbance, dysphoric mood, decreased concentration and agitation.      OBJECTIVE: Temp:  [97.1 F (36.2 C)-99.1 F (37.3 C)] 97.1 F (36.2 C) (03/14 1223) Pulse Rate:  [61-94] 94 (03/14 1223) Resp:  [18-20] 18 (03/14 0512) BP: (132-149)/(66-87) 149/87 (03/14 1223) SpO2:  [98 %-100  %] 100 % (03/14 1223) Physical Exam  Constitutional:  oriented to person, place, and time. appears well-developed and well-nourished. No distress.  HENT: [place]/AT, PERRLA, no scleral icterus Mouth/Throat: Oropharynx is clear and moist. No oropharyngeal exudate.  Cardiovascular: Normal rate, regular rhythm and normal heart sounds. Exam reveals no gallop and no friction rub.  No murmur heard.  Pulmonary/Chest: Effort normal and breath sounds normal. No respiratory distress.  has no wheezes.  Neck = supple, no nuchal rigidity Abdominal: Soft. Bowel sounds are normal.  exhibits no distension. Abdominal dressing and. Drain in place Lymphadenopathy: no cervical adenopathy. No axillary adenopathy Ext: anasarc  Skin: Skin is warm and dry. No rash noted. No erythema.  Psychiatric: a normal mood and affect.  behavior is normal.    LABS: Results for orders placed or performed during the hospital encounter of 01/14/2021 (from the past 48 hour(s))  Glucose, capillary     Status: Abnormal  Collection Time: 02/01/21  9:10 PM  Result Value Ref Range   Glucose-Capillary 121 (H) 70 - 99 mg/dL    Comment: Glucose reference range applies only to samples taken after fasting for at least 8 hours.  Glucose, capillary     Status: None   Collection Time: 02/02/21  8:02 AM  Result Value Ref Range   Glucose-Capillary 75 70 - 99 mg/dL    Comment: Glucose reference range applies only to samples taken after fasting for at least 8 hours.  CBC     Status: Abnormal   Collection Time: 02/02/21  8:25 AM  Result Value Ref Range   WBC 7.0 4.0 - 10.5 K/uL   RBC 2.81 (L) 3.87 - 5.11 MIL/uL   Hemoglobin 9.1 (L) 12.0 - 15.0 g/dL   HCT 31.5 (L) 36.0 - 46.0 %   MCV 112.1 (H) 80.0 - 100.0 fL   MCH 32.4 26.0 - 34.0 pg   MCHC 28.9 (L) 30.0 - 36.0 g/dL   RDW 24.1 (H) 11.5 - 15.5 %   Platelets 90 (L) 150 - 400 K/uL    Comment: Immature Platelet Fraction may be clinically indicated, consider ordering this additional  test [id number] CONSISTENT WITH PREVIOUS RESULT REPEATED TO VERIFY    nRBC 0.0 0.0 - 0.2 %    Comment: Performed at [facility], [place] [address], [place], [place] [address]  Basic metabolic panel     Status: Abnormal   Collection Time: 02/02/21  8:25 AM  Result Value Ref Range   Sodium 140 135 - 145 mmol/L   Potassium 3.3 (L) 3.5 - 5.1 mmol/L   Chloride 112 (H) 98 - 111 mmol/L   CO2 18 (L) 22 - 32 mmol/L   Glucose, Bld 86 70 - 99 mg/dL    Comment: Glucose reference range applies only to samples taken after fasting for at least 8 hours.   BUN 55 (H) 8 - 23 mg/dL   Creatinine, Ser 2.14 (H) 0.44 - 1.00 mg/dL   Calcium 8.7 (L) 8.9 - 10.3 mg/dL   GFR, Estimated 25 (L) >60 mL/min    Comment: (NOTE) Calculated using the CKD-EPI Creatinine Equation (2021)    Anion gap 10 5 - 15    Comment: Performed at [facility], [place] [address] [place], [place] [address]  Glucose, capillary     Status: Abnormal   Collection Time: 02/02/21 11:42 AM  Result Value Ref Range   Glucose-Capillary 111 (H) 70 - 99 mg/dL    Comment: Glucose reference range applies only to samples taken after fasting for at least 8 hours.  Glucose, capillary     Status: Abnormal   Collection Time: 02/02/21  4:44 PM  Result Value Ref Range   Glucose-Capillary 164 (H) 70 - 99 mg/dL    Comment: Glucose reference range applies only to samples taken after fasting for at least 8 hours.  Glucose, capillary     Status: Abnormal   Collection Time: 02/02/21  9:17 PM  Result Value Ref Range   Glucose-Capillary 159 (H) 70 - 99 mg/dL    Comment: Glucose reference range applies only to samples taken after fasting for at least 8 hours.  CBC     Status: Abnormal   Collection Time: 02/03/21  5:12 AM  Result Value Ref Range   WBC 4.8 4.0 - 10.5 K/uL   RBC 2.62 (L) 3.87 - 5.11 MIL/uL   Hemoglobin 8.5 (L) 12.0 - 15.0 g/dL   HCT 29.4 (L)  36.0 - 46.0 %   MCV 112.2 (H) 80.0 - 100.0 fL   MCH 32.4 26.0 -  34.0 pg   MCHC 28.9 (L) 30.0 - 36.0 g/dL   RDW 24.4 (H) 11.5 - 15.5 %   Platelets 80 (L) 150 - 400 K/uL    Comment: Immature Platelet Fraction may be clinically indicated, consider ordering this additional test [id number]    nRBC 0.0 0.0 - 0.2 %    Comment: Performed at [facility], [place] [address] [place], [place] [address]  Basic metabolic panel     Status: Abnormal   Collection Time: 02/03/21  5:12 AM  Result Value Ref Range   Sodium 142 135 - 145 mmol/L   Potassium 3.5 3.5 - 5.1 mmol/L   Chloride 113 (H) 98 - 111 mmol/L   CO2 17 (L) 22 - 32 mmol/L   Glucose, Bld 110 (H) 70 - 99 mg/dL    Comment: Glucose reference range applies only to samples taken after fasting for at least 8 hours.   BUN 53 (H) 8 - 23 mg/dL   Creatinine, Ser 2.00 (H) 0.44 - 1.00 mg/dL   Calcium 8.7 (L) 8.9 - 10.3 mg/dL   GFR, Estimated 28 (L) >60 mL/min    Comment: (NOTE) Calculated using the CKD-EPI Creatinine Equation (2021)    Anion gap 12 5 - 15    Comment: Performed at [facility], [place] [address], [place], [place] [address]  Glucose, capillary     Status: None   Collection Time: 02/03/21  8:04 AM  Result Value Ref Range   Glucose-Capillary 84 70 - 99 mg/dL    Comment: Glucose reference range applies only to samples taken after fasting for at least 8 hours.  Glucose, capillary     Status: Abnormal   Collection Time: 02/03/21 12:15 PM  Result Value Ref Range   Glucose-Capillary 121 (H) 70 - 99 mg/dL    Comment: Glucose reference range applies only to samples taken after fasting for at least 8 hours.  Glucose, capillary     Status: Abnormal   Collection Time: 02/03/21  5:09 PM  Result Value Ref Range   Glucose-Capillary 143 (H) 70 - 99 mg/dL    Comment: Glucose reference range applies only to samples taken after fasting for at least 8 hours.    MICRO: reviewed IMAGING: IR Sinus/Fist Tube Chk-Non GI  Result Date: 02/03/2021 CLINICAL DATA:  65 year old  female with history of diverticular abscess and left lower quadrant pigtail drainage catheter placement on 01/24/2021. EXAM: SINUS TRACT INJECTION/FISTULOGRAM COMPARISON:  01/24/2021 CONTRAST:  10 mL Omnipaque 300-administered via the existing percutaneous drain. FLUOROSCOPY TIME:  42 seconds, 5 mGy TECHNIQUE: The patient was positioned supine on the fluoroscopy table. A preprocedural spot fluoroscopic image was obtained of the left lower quadrant and the existing percutaneous drainage catheter. Multiple spot fluoroscopic and radiographic images were obtained following the injection of a small amount of contrast via the existing percutaneous drainage catheter. FINDINGS: Free flow of contrast material into the left pericolic gutter as well as tracking medially and inferiorly in the pelvis. Delayed images demonstrate opacification of the sigmoid colon and rectum. 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 PLAN: Keep drainage catheter to bulb suction. IR will continue to follow while inpatient. Plan to follow up with repeat drain injection in 1 month. Ruthann Cancer, MD [facility] Electronically Signed   By: Ruthann Cancer MD  On: 02/03/2021 12:05    Assessment/Plan:  65yo F immunocompromised host with diverticular abscess s/p pigtain catheter complicated with colonic fistula plus cdifficile infection. - can switch current iv abtx to amox/clav 875 bid x 2 addn 10 days and monitor output of fistula to see if need to extend/ or not at follow up visit -please check sed rate and crp  C.difficile infection = would place her on oral vancomycin 125mg  PO BID x 14 days.  Will see back in the ID clinic in 10 days to evaluate if need to extend abtx  Stage 3 coccygeal wound = measuring 1.5cm x 1cm x 0.5cm per wound care. Recommend to follow woc recs and improve nutritional intake  Hx of renal txp  = cr function appears stable during this admission.

## 2021-02-03 NOTE — Progress Notes (Signed)
   Daily Progress Note   Patient Name: April [name]       Date: 02/03/2021 DOB: August 08, 1956  Age: 65 y.o. MRN#: [id number] Attending Physician: Antonieta Pert, MD Primary Care Physician: [name], MD Admit Date: 01/01/2021  Reason for Consultation/Follow-up: Establishing goals of care  Subjective: Chart Reviewed and Updates Received.   I saw and examined April [name] today.  Her daughter, April [name] was also at the bedside.  We reviewed MOST form that she and April [name] had reviewed together earlier this week.    We completed MOST form today. DNR, Limited additional interventions, IVF and ABX if indicated, no feeding tube.   Length of Stay: 14 days  Vital Signs: BP 132/66 (BP Location: Left Leg)   Pulse 61   Temp 99.1 F (37.3 C) (Oral)   Resp 18   Ht 5\' 1"  (1.549 m)   Wt 72.2 kg   SpO2 98%   BMI 30.08 kg/m  SpO2: SpO2: 98 % O2 Device: O2 Device: Nasal Cannula O2 Flow Rate: O2 Flow Rate (L/min): 2 L/min  Physical Exam: Resting, easily aroused Normal respiratory efforts RRR A&O x3            Palliative Care Assessment & Plan    Code Status:  DNR  Goals of Care/Recommendations:  DNR/DNI  We reviewed/completed MOST form today.  DNR; Limited additional interventions; IV antibiotics and fluids as indicated; no feeding tube.  PMT will continue to support and follow as needed. Please contact team line for urgent needs.   Prognosis: Guarded   Discharge Planning: To Be Determined  Thank you for allowing the Palliative Medicine Team to assist in the care of this patient.  Time Total: 20 min.   Greater than 50%  of this time was spent counseling and coordinating care related to the above assessment and plan.  Micheline Rough, MD [facility] Team [phone]

## 2021-02-03 NOTE — Progress Notes (Signed)
PROGRESS NOTE    April [name]  SKA:[id number] DOB: 10/04/1956 DOA: 01/07/2021 PCP: [name], MD   Chief Complaint  Patient presents with  . Hypotension  . hypoxia   Brief Narrative: 65 year old female with medical history of A flutter status post, chronic respiratory failure on 2 L oxygen, asthma, OSA, CKD stage IIIa, ESRD post transplant, diastolic heart failure, diabetes presented secondary to acute encephalopathy with associated hypotension in setting of intraabdominal fluid collection and recent intraabdominal surgery. General surgery and IR was consulted for management. Patient managed empirically on antibiotics. Found to have C. Difficile and started on Vancomycin PO.  Status post IR drain placement 3/4-being managed by IR, general surgery.  Patient has colostomy and lower abdominal wall wound and managed by dressing changes. Patient remains very weak deconditioned with poor oral intake.  She is at risk of decompensation.  Palliative care on board she is DNR.  Subjective: Reports ongoing nausea poor appetite and poor intake.   No abdominal pain.  Denies any fever.   JP drain in place hopefully will go for removal today  Assessment & Plan: Intra-abdominal fluid/abscess, adjacent to enterotomy staple line s/p IR drain 3/4 History of perforated sigmoid diverticulitis Status post ex lap with sigmoid colectomy/end colostomy 12/29/2020 by Dr. Ninfa Linden Outpatient surgery and IR input on board.  Drain culture with mixed organism.  CT abdomen 3/11-interval resolution of  LLQ abscess collection -hopefully drain will be removed today.  With catheter in place hopefully can remove defer to IR. Remains on Cefepime, Flagyl day #14-sent message to surgery to see if antibiotic can be stopped.  Continue to augment diet, cont dressing and colostomy care and augment nutritional status.   C. difficile colitis positive antigen/toxin 3/1: Mild symptoms, completed vancomycin therapy.  No diarrhea.    Acute on chronic diastolic CHF/Anasarca Volume status is stable.  Off Lasix due to AKI 01/31/21. Net IO Since Admission: -5,290.79 mL [02/03/21 1110]  Polycystic kidney disease ESRD hx s/p renal transplant 2013 on immunosuppressant and chronic steroid AKI on CKD stage III: Creatinine now stable at 2.0 off IV fluids.  Monitor overnight without IV fluids Lasix on hold.Continue her Prograf, cellcept,and prednisone-and will need her to follow-up with her nephrology as per outpatient. Recent Labs  Lab 01/30/21 0248 01/31/21 0555 02/01/21 0546 02/02/21 0825 02/03/21 0512  BUN 62* 62* 59* 55* 53*  CREATININE 1.84* 2.06* 2.15* 2.14* 2.00*   History of adrenal insufficiency on her prednisone 7.5 mg.  Acute on chronic respiratory failure with hypoxia: Doing well on 2 L nasal cannula.  Encourage PT OT incentive spirometry but patient still has been very deconditioned and bedbound   Acute metabolic encephalopathy: She is alert awake mentating well.  But deconditioned.    Hypotension blood pressure is stable.    Acute urine retention:? Voiding, purewick in place  Acute on chronic anemia/Normocytic anemia/Iron deficiency anemia Large hematoma Left arm drained and dressed Anemia in the setting of recent abdominal surgery left arm hematoma.  Holding stable in 8 to 9 g range.  Monitor.   Recent Labs  Lab 01/30/21 0248 01/31/21 0555 02/01/21 0546 02/02/21 0825 02/03/21 0512  HGB 8.9* 9.0* 9.3* 9.1* 8.5*  HCT 29.8* 30.9* 31.1* 31.5* 29.4*   Typical atrial flutter, TEE/cardioversion 2/6 and in sinus and has been on Eliquis, on IV heparin this admission, on metoprolol 25 mg twice daily.  Heparin was discontinued and patient was placed back on Eliquis after cleared by surgery 3/11. Monitor cbc on d/c.  T2DM: Blood sugar w/ a episode of hypoglycemia 65 on 3/9 am-CBG was low due to poor intake and  discontinued Lantus altogether 3/12- cont ssi and monitor cbg Recent Labs  Lab 02/02/21 0802  02/02/21 1142 02/02/21 1644 02/02/21 2117 02/03/21 0804  GLUCAP 75 111* 164* 902* 84   Metabolic acidosis with low bicarb.  Resolved. Depression continue her Prozac appears depressed and deconditioned  Cont her allopurinol,  Goals of care: DNR.Palliative care following.  Given ocmplex comorbidities, prognosis appears guarded, at risk of decompensation. she is very deconditioned and frail and with poor appttite-SNF has been recommended but patient and family at this time wanting to return home.  I am afraid she may need home with hospice eventually down the line given her overall frailty  Diet Order            DIET DYS 3 Room service appropriate? Yes with Assist; Fluid consistency: Thin  Diet effective now                 Nutrition Problem: Increased nutrient needs Etiology: acute illness,wound healing Signs/Symptoms: estimated needs Interventions: Juven,Magic cup,Ensure Enlive (each supplement provides 350kcal and 20 grams of protein),Prostat Patient's Body mass index is 30.08 kg/m.  DVT prophylaxis: Heprain iv Code Status:   Code Status: DNR Family Communication: plan of care discussed with patient. Discussed with the daughter at the bedside  3/13  Status is: Inpatient Remains inpatient appropriate because:IV treatments appropriate due to intensity of illness or inability to take PO and Inpatient level of care appropriate due to severity of illness  Dispo: The patient is from: Home              Anticipated d/c is to: Home tomorrow if okay with surgery IR and palliative care.                Patient currently is not medically stable to d/c.   Difficult to place patient No Unresulted Labs (From admission, onward)          Start     Ordered   02/03/21 0500  CBC  Daily,   R     Question:  Specimen collection method  Answer:  Lab=Lab collect   02/02/21 0746   02/03/21 4097  Basic metabolic panel  Daily,   R     Question:  Specimen collection method  Answer:  Lab=Lab  collect   02/02/21 0746         Medications reviewed:  Scheduled Meds: . (feeding supplement) PROSource Plus  30 mL Oral BID BM  . allopurinol  100 mg Oral Daily  . apixaban  5 mg Oral BID  . brimonidine  1 drop Both Eyes BID  . chlorhexidine  15 mL Mouth Rinse BID  . cycloSPORINE  1 drop Both Eyes BID  . dorzolamide  1 drop Both Eyes Daily  . dronabinol  2.5 mg Oral BID AC  . feeding supplement  237 mL Oral BID BM  . FLUoxetine  40 mg Oral Daily  . hydrocortisone   Rectal BID  . insulin aspart  0-5 Units Subcutaneous QHS  . insulin aspart  0-9 Units Subcutaneous TID WC  . latanoprost  1 drop Both Eyes QHS  . mouth rinse  15 mL Mouth Rinse q12n4p  . metoprolol tartrate  25 mg Oral BID  . multivitamin with minerals  1 tablet Oral Daily  . mycophenolate  180 mg Oral BID  . nutrition supplement (JUVEN)  1 packet Oral BID  BM  . predniSONE  7.5 mg Oral Q breakfast  . sodium chloride flush  3 mL Intravenous Q12H  . sodium chloride flush  5 mL Intracatheter Q8H  . tacrolimus  1 mg Oral BID   Continuous Infusions: . sodium chloride 10 mL/hr at 01/28/21 0832  . sodium chloride Stopped (01/30/21 0105)  . ceFEPime (MAXIPIME) IV 2 g (02/02/21 1751)  . metronidazole 500 mg (02/03/21 0742)    Consultants:see note  Procedures:see note  Antimicrobials: Anti-infectives (From admission, onward)   Start     Dose/Rate Route Frequency Ordered Stop   01/21/21 2200  vancomycin (VANCOCIN) 50 mg/mL oral solution 125 mg        125 mg Oral 4 times daily 01/21/21 2028 01/31/21 2159   01/21/21 1800  ceFEPIme (MAXIPIME) 2 g in sodium chloride 0.9 % 100 mL IVPB        2 g 200 mL/hr over 30 Minutes Intravenous Every 24 hours 12/29/2020 1742     01/21/21 0000  metroNIDAZOLE (FLAGYL) IVPB 500 mg        500 mg 100 mL/hr over 60 Minutes Intravenous Every 8 hours 12/31/2020 1821     01/05/2021 1800  vancomycin (VANCOCIN) IVPB 1000 mg/200 mL premix  Status:  Discontinued        1,000 mg 200 mL/hr over 60  Minutes Intravenous Every 48 hours 12/29/2020 1742 01/22/21 1053   01/06/2021 1545  ceFEPIme (MAXIPIME) 2 g in sodium chloride 0.9 % 100 mL IVPB        2 g 200 mL/hr over 30 Minutes Intravenous  Once 01/11/2021 1544 12/30/2020 1625   01/19/2021 1545  metroNIDAZOLE (FLAGYL) IVPB 500 mg        500 mg 100 mL/hr over 60 Minutes Intravenous  Once 01/03/2021 1544 01/07/2021 1730   01/19/2021 1545  vancomycin (VANCOCIN) IVPB 1000 mg/200 mL premix  Status:  Discontinued        1,000 mg 200 mL/hr over 60 Minutes Intravenous  Once 01/07/2021 1544 12/31/2020 1847     Culture/Microbiology    Component Value Date/Time   SDES  01/24/2021 1308    ABSCESS Performed at [facility], [place] [address], [place], [place] [address]    SPECREQUEST  01/24/2021 1308    NONE Performed at [facility], [place] [address] [place], [place] [address]    CULT (A) 01/24/2021 1308    MULTIPLE ORGANISMS PRESENT, NONE PREDOMINANT NO ANAEROBES ISOLATED Performed at [facility], [place] [address] [place], [place] [address]    REPTSTATUS 01/30/2021 FINAL 01/24/2021 1308    Other culture-see note  Objective: Vitals: Today's Vitals   02/02/21 2002 02/02/21 2122 02/03/21 0512 02/03/21 0838  BP:  138/67 132/66   Pulse:  80 61   Resp:  20 18   Temp:  99.1 F (37.3 C)    TempSrc:  Oral    SpO2:  99% 98%   Weight:      Height:      PainSc: 0-No pain   0-No pain    Intake/Output Summary (Last 24 hours) at 02/03/2021 1110 Last data filed at 02/03/2021 1000 Gross per 24 hour  Intake 60 ml  Output 1260 ml  Net -1200 ml   Filed Weights   01/29/21 0500 01/30/21 0433 01/31/21 0624  Weight: 70.3 kg 72 kg 72.2 kg   Weight change:   Intake/Output from previous day: 03/13 0701 - 03/14 0700 In: 319.2 [P.O.:60; I.V.:259.2] Out: 545 [Urine:450; Drains:20; Stool:75] Intake/Output  this shift: Total I/O In: -  Out: 800 [Urine:800] Filed Weights   01/29/21 0500 01/30/21 0433 01/31/21 0624   Weight: 70.3 kg 72 kg 72.2 kg    Examination:  General exam: AA frail ill looking older than hers stated age on nasal cannula oxygen. HEENT:Oral mucosa moist, Ear/Nose WNL grossly, dentition normal. Respiratory system: bilaterally diminishedd,no wheezing or crackles,no use of accessory muscle Cardiovascular system: S1 & S2 +, No JVD,. Gastrointestinal system: Abdomen soft, dressing in place on lower abdomen, JP drain present on the left lower quadrant , colostomy +with still  Nervous System:Alert, awake, nonfocal but deconditioned and grossly weak  Extremities: no edema Skin: No rashes,no icterus. MSK: Normal muscle bulk,tone, power  Data Reviewed: I have personally reviewed following labs and imaging studies CBC: Recent Labs  Lab 01/30/21 0248 01/31/21 0555 02/01/21 0546 02/02/21 0825 02/03/21 0512  WBC 6.9 6.5 7.4 7.0 4.8  HGB 8.9* 9.0* 9.3* 9.1* 8.5*  HCT 29.8* 30.9* 31.1* 31.5* 29.4*  MCV 106.8* 108.4* 107.6* 112.1* 112.2*  PLT 128* 110* 93* 90* 80*   Basic Metabolic Panel: Recent Labs  Lab 01/30/21 0248 01/31/21 0555 02/01/21 0546 02/02/21 0825 02/03/21 0512  NA 139 140 139 140 142  K 3.6 3.3* 3.8 3.3* 3.5  CL 107 107 107 112* 113*  CO2 20* 22 20* 18* 17*  GLUCOSE 106* 89 77 86 110*  BUN 62* 62* 59* 55* 53*  CREATININE 1.84* 2.06* 2.15* 2.14* 2.00*  CALCIUM 8.8* 8.6* 8.5* 8.7* 8.7*   GFR: Estimated Creatinine Clearance: 26.2 mL/min (A) (by C-G formula based on SCr of 2 mg/dL (H)). Liver Function Tests: No results for input(s): AST, ALT, ALKPHOS, BILITOT, PROT, ALBUMIN in the last 168 hours. No results for input(s): LIPASE, AMYLASE in the last 168 hours. No results for input(s): AMMONIA in the last 168 hours. Coagulation Profile: No results for input(s): INR, PROTIME in the last 168 hours. Cardiac Enzymes: No results for input(s): CKTOTAL, CKMB, CKMBINDEX, TROPONINI in the last 168 hours. BNP (last 3 results) No results for input(s): PROBNP in the last  8760 hours. HbA1C: No results for input(s): HGBA1C in the last 72 hours. CBG: Recent Labs  Lab 02/02/21 0802 02/02/21 1142 02/02/21 1644 02/02/21 2117 02/03/21 0804  GLUCAP 75 111* 164* 159* 84   Lipid Profile: No results for input(s): CHOL, HDL, LDLCALC, TRIG, CHOLHDL, LDLDIRECT in the last 72 hours. Thyroid Function Tests: No results for input(s): TSH, T4TOTAL, FREET4, T3FREE, THYROIDAB in the last 72 hours. Anemia Panel: No results for input(s): VITAMINB12, FOLATE, FERRITIN, TIBC, IRON, RETICCTPCT in the last 72 hours. Sepsis Labs: No results for input(s): PROCALCITON, LATICACIDVEN in the last 168 hours.  Recent Results (from the past 240 hour(s))  Aerobic/Anaerobic Culture (surgical/deep wound)     Status: Abnormal   Collection Time: 01/24/21  1:08 PM   Specimen: Abscess  Result Value Ref Range Status   Specimen Description   Final    ABSCESS Performed at [facility] [address], [place], [place] [address]    Special Requests   Final    NONE Performed at [facility], [place] [address] [place], [place] [address]    Gram Stain   Final    MODERATE WBC PRESENT, PREDOMINANTLY PMN MODERATE GRAM POSITIVE COCCI IN PAIRS IN CLUSTERS FEW GRAM POSITIVE RODS RARE GRAM NEGATIVE RODS    Culture (A)  Final    MULTIPLE ORGANISMS PRESENT, NONE PREDOMINANT NO ANAEROBES ISOLATED Performed at [facility],  [address] [place], [place] [address]    Report Status 01/30/2021 FINAL  Final     Radiology Studies: No results found.   LOS: 14 days   Antonieta Pert, MD [facility]  02/03/2021, 11:10 AM

## 2021-02-03 NOTE — Progress Notes (Signed)
Referring Physician(s): Dr. Redmond Pulling  Supervising Physician: Ruthann Cancer  Patient Status:  [name] - In-pt  Chief Complaint: Perforated diverticulitis s/p exploratory laparoscopy and colostomy 12/29/20. Developed post-op intraabdominal fluid collection with IR drain placement 01/24/21  Subjective: Patient seen in IR for contrast drain injection to evaluate for possible drain removal. She was alert but minimally interactive during the procedure. She did not appear to be in any pain or discomfort.   Allergies: Infed [iron dextran], Pentamidine, Budesonide-formoterol fumarate, Bupropion, Crestor [rosuvastatin], Duloxetine hcl, Erythromycin [erythromycin], Iohexol, Oxycodone, Pravastatin sodium, Erythromycin, and Ultram [tramadol hcl]  Medications: Prior to Admission medications   Medication Sig Start Date End Date Taking? Authorizing Provider  acetaminophen (TYLENOL) 325 MG tablet Take 2 tablets (650 mg total) by mouth every 6 (six) hours as needed for mild pain (or Fever >/= 101). 05/22/20  Yes Elgergawy, Silver Huguenin, MD  allopurinol (ZYLOPRIM) 100 MG tablet Take 100 mg by mouth daily. 11/29/20  Yes [provider]  amLODipine (NORVASC) 5 MG tablet Take 1 tablet (5 mg total) by mouth daily. 01/15/21  Yes Mercy Riding, MD  apixaban (ELIQUIS) 5 MG TABS tablet Take 1 tablet (5 mg total) by mouth 2 (two) times daily. 01/14/21  Yes Mercy Riding, MD  brimonidine (ALPHAGAN P) 0.1 % SOLN Place 1 drop into both eyes 2 (two) times daily.    Yes [provider]  Budeson-Glycopyrrol-Formoterol (BREZTRI AEROSPHERE) 160-9-4.8 MCG/ACT AERO Inhale 2 puffs into the lungs 2 (two) times daily. 08/20/20  Yes Martyn Ehrich, NP  cholecalciferol (VITAMIN D3) 25 MCG (1000 UNIT) tablet Take 1 tablet (1,000 Units total) by mouth daily. 01/04/20  Yes Angiulli, Lavon Paganini, PA-C  colchicine 0.6 MG tablet Take 0.5 tablets (0.3 mg total) by mouth daily. 01/15/21  Yes Mercy Riding, MD  cyanocobalamin 1000 MCG  tablet Take 1,000 mcg by mouth daily.   Yes [provider]  cycloSPORINE (RESTASIS) 0.05 % ophthalmic emulsion Place 1 drop into both eyes 2 (two) times daily. 01/04/20  Yes Angiulli, Lavon Paganini, PA-C  dorzolamide (TRUSOPT) 2 % ophthalmic solution Place 1 drop into both eyes daily. 06/25/19  Yes [provider]  ferrous sulfate 325 (65 FE) MG tablet Take 325 mg by mouth daily with breakfast.   Yes [provider]  FLUoxetine (PROZAC) 40 MG capsule Take 1 capsule (40 mg total) by mouth daily. 01/04/20  Yes Angiulli, Lavon Paganini, PA-C  furosemide (LASIX) 40 MG tablet Take 1 tablet (40 mg total) by mouth 2 (two) times daily. 01/14/21  Yes Mercy Riding, MD  gabapentin (NEURONTIN) 100 MG capsule Take 200 mg by mouth 2 (two) times daily.   Yes [provider]  Glucerna (GLUCERNA) LIQD Take 237 mLs by mouth 2 (two) times daily between meals.   Yes [provider]  guaiFENesin (MUCINEX) 600 MG 12 hr tablet Take 600 mg by mouth 2 (two) times daily.   Yes [provider]  HYDROcodone-acetaminophen (NORCO/VICODIN) 5-325 MG tablet Take 1 tablet by mouth every 12 (twelve) hours as needed for moderate pain.   Yes [provider]  insulin aspart (NOVOLOG) 100 UNIT/ML injection Inject 0-9 Units into the skin 3 (three) times daily with meals. CBG 70 - 120: 0 units CBG 121 - 150: 1 unit CBG 151 - 200: 2 units CBG 201 - 250: 3 units CBG 251 - 300: 5 units CBG 301 - 350: 7 units CBG 351 - 400: 9 units 01/14/21  Yes Gonfa, Taye T,  MD  insulin glargine (LANTUS) 100 UNIT/ML injection Inject 0.05 mLs (5 Units total) into the skin 2 (two) times daily. 01/14/21  Yes Mercy Riding, MD  metoprolol tartrate (LOPRESSOR) 25 MG tablet Take 1 tablet (25 mg total) by mouth 2 (two) times daily. 01/14/21  Yes Mercy Riding, MD  mycophenolate (MYFORTIC) 180 MG EC tablet Take 180 mg by mouth 2 (two) times daily.   Yes [provider]  Nutritional Supplements (,FEEDING  SUPPLEMENT, PROSOURCE PLUS) liquid Take 30 mLs by mouth 2 (two) times daily between meals. 01/14/21  Yes Mercy Riding, MD  ondansetron (ZOFRAN) 4 MG tablet Take 1 tablet (4 mg total) by mouth every 8 (eight) hours as needed for nausea or vomiting. 01/14/21 01/14/22 Yes Mercy Riding, MD  pantoprazole (PROTONIX) 40 MG tablet Take 1 tablet (40 mg total) by mouth daily. 01/15/21  Yes Mercy Riding, MD  predniSONE (DELTASONE) 2.5 MG tablet Take 3 tablets (7.5 mg total) by mouth daily with breakfast. 01/15/21  Yes Gonfa, Taye T, MD  PROGRAF 1 MG capsule Take 1 mg by mouth 2 (two) times daily.  03/22/19  Yes [provider]  sodium hypochlorite (DAKIN'S 1/4 STRENGTH) 0.125 % SOLN Irrigate with as directed 2 (two) times daily. 01/14/21  Yes Gonfa, Charlesetta Ivory, MD  TRAVATAN Z 0.004 % SOLN ophthalmic solution Place 1 drop into both eyes at bedtime. 07/03/16  Yes [provider]  valACYclovir (VALTREX) 500 MG tablet Take 500 mg by mouth 2 (two) times daily.   Yes [provider]  albuterol (VENTOLIN HFA) 108 (90 Base) MCG/ACT inhaler Inhale 2 puffs into the lungs every 4 (four) hours as needed for wheezing or shortness of breath. 01/04/20   Angiulli, Lavon Paganini, PA-C  ipratropium-albuterol (DUONEB) 0.5-2.5 (3) MG/3ML SOLN Take 3 mLs by nebulization every 6 (six) hours as needed (sob/wheezing).  08/24/20   [provider]  promethazine (PHENERGAN) 25 MG tablet Take 1 tablet (25 mg total) by mouth every 8 (eight) hours as needed for up to 15 doses for nausea or vomiting. 09/02/20   Curatolo, Adam, DO     Vital Signs: BP (!) 149/87   Pulse 94   Temp (!) 97.1 F (36.2 C) (Oral)   Resp 18   Ht 5\' 1"  (1.549 m)   Wt 159 lb 2.8 oz (72.2 kg)   SpO2 100%   BMI 30.08 kg/m   Physical Exam Constitutional:      General: She is not in acute distress.    Appearance: She is ill-appearing.  Abdominal:     Palpations: Abdomen is soft.     Comments: LLQ colostomy. LLQ abscess drain to suction.  Site is clean and dry.   Skin:    General: Skin is warm and dry.  Neurological:     Mental Status: She is alert.     Comments: Unable to fully assess. She was able to answer a few simple questions regarding her drain.      Imaging: CT ABDOMEN PELVIS WO CONTRAST  Result Date: 02/01/2021 CLINICAL DATA:  65 year old female with a history of left lower quadrant diverticular abscess status post percutaneous drain placement on 01/24/2021. EXAM: CT ABDOMEN AND PELVIS WITHOUT CONTRAST TECHNIQUE: Multidetector CT imaging of the abdomen and pelvis was performed following the standard protocol without IV contrast. COMPARISON:  Prior CT abdomen/pelvis 01/13/2021 FINDINGS: Lower chest: Small bilateral pleural effusions with associated subsegmental atelectasis. The intracardiac blood pool is hypodense relative to the adjacent myocardium consistent  with anemia. No pericardial effusion. Atherosclerotic calcifications visualized along the coronary arteries. Hepatobiliary: Numerous circumscribed low-attenuation liver lesions, similar compared to prior imaging and likely reflecting simple cysts. High attenuation material layers dependently within the gallbladder lumen consistent with sludge and/or small stones. No biliary ductal dilatation. Pancreas: Unremarkable. No pancreatic ductal dilatation or surrounding inflammatory changes. Spleen: Normal in size without focal abnormality. Adrenals/Urinary Tract: Unremarkable adrenal glands. Innumerable circumscribed low-attenuation cysts throughout both kidneys. 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 Unremarkable ureters and bladder. Transplant kidney in the right lower quadrant without evidence of hydronephrosis. Stomach/Bowel: Colonic diverticular disease without CT evidence of active inflammation. Surgical changes of prior partial left colectomy with Hartmann's pouch and left lower quadrant end colostomy. A percutaneous drainage catheter  is present adjacent to the stump of the Hartmann's pouch. Vascular/Lymphatic: Limited evaluation in the absence of intravenous contrast. No evidence of aneurysm. Atherosclerotic calcifications present throughout the abdominal aorta. No suspicious lymphadenopathy. Reproductive: Uterus and bilateral adnexa are unremarkable. Other: Interval resolution of fluid collection from within the left lower quadrant adjacent to the Hartmann's pouch stump. Small volume ascites in the right pericolic gutter. Open midline incision. No evidence of hernia. Musculoskeletal: No acute osseous abnormality. Surgical changes of prior left total hip arthroplasty. Dependent body wall edema. IMPRESSION: 1. Interval resolution of left lower quadrant abscess collection with well-positioned percutaneous drainage catheter. 2. Small bilateral pleural effusions with associated atelectasis. 3. Small volume free fluid in the right pericolic gutter. 4. The intracardiac blood pool is hypodense relative to the adjacent myocardium consistent with anemia. 5. Additional ancillary findings as above without significant interval change. Electronically Signed   By: Jacqulynn Cadet M.D.   On: 02/01/2021 11:00   IR Sinus/Fist Tube Chk-Non GI  Result Date: 02/03/2021 CLINICAL DATA:  65 year old female with history of diverticular abscess and left lower quadrant pigtail drainage catheter placement on 01/24/2021. EXAM: SINUS TRACT INJECTION/FISTULOGRAM COMPARISON:  01/24/2021 CONTRAST:  10 mL Omnipaque 300-administered via the existing percutaneous drain. FLUOROSCOPY TIME:  42 seconds, 5 mGy TECHNIQUE: The patient was positioned supine on the fluoroscopy table. A preprocedural spot fluoroscopic image was obtained of the left lower quadrant and the existing percutaneous drainage catheter. Multiple spot fluoroscopic and radiographic images were obtained following the injection of a small amount of contrast via the existing percutaneous drainage catheter.  FINDINGS: Free flow of contrast material into the left pericolic gutter as well as tracking medially and inferiorly in the pelvis. Delayed images demonstrate opacification of the sigmoid colon and rectum. 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 PLAN: Keep drainage catheter to bulb suction. IR will continue to follow while inpatient. Plan to follow up with repeat drain injection in 1 month. Ruthann Cancer, MD [facility] Radiology Electronically Signed   By: Ruthann Cancer MD   On: 02/03/2021 12:05    Labs:  CBC: Recent Labs    01/31/21 0555 02/01/21 0546 02/02/21 0825 02/03/21 0512  WBC 6.5 7.4 7.0 4.8  HGB 9.0* 9.3* 9.1* 8.5*  HCT 30.9* 31.1* 31.5* 29.4*  PLT 110* 93* 90* 80*    COAGS: Recent Labs    01/21/21 0239 01/21/21 1000 01/22/21 0258 01/23/21 0248 01/23/21 1304 01/24/21 0252 01/25/21 0523 01/25/21 1458 01/26/21 0145  INR 3.1*  --  2.2* 1.6*  --  1.9*  --   --   --   APTT 133*   < > 68* 57*   < > 61*  56* 56* 105*   < > = values in this interval not displayed.    BMP: Recent Labs    07/01/20 2334 07/02/20 0030 07/03/20 0350 07/18/20 1545 08/05/20 1523 09/02/20 2049 01/31/21 0555 02/01/21 0546 02/02/21 0825 02/03/21 0512  NA 135   < > 139 131* 140   < > 140 139 140 142  K 4.4   < > 5.1 4.7 4.5   < > 3.3* 3.8 3.3* 3.5  CL 102   < > 108 95* 106   < > 107 107 112* 113*  CO2 25  --  20* 24 27   < > 22 20* 18* 17*  GLUCOSE 135*   < > 187* 187* 189*   < > 89 77 86 110*  BUN 28*   < > 42* 30* 45*   < > 62* 59* 55* 53*  CALCIUM 9.7  --  9.0 9.4 10.1   < > 8.6* 8.5* 8.7* 8.7*  CREATININE 1.02*   < > 1.17* 1.39* 1.30*   < > 2.06* 2.15* 2.14* 2.00*  GFRNONAA 58*  --  50* 40* 44*   < > 27* 25* 25* 28*  GFRAA >60  --  57* 47* 51*  --   --   --   --   --    < > = values in this interval not displayed.    LIVER FUNCTION TESTS: Recent Labs     01/03/21 0300 01/04/21 0556 01/08/21 1021 01/09/21 0511 01/12/21 0250 01/13/21 0049 01/18/2021 1339 01/22/21 0258  BILITOT 1.4*  --  1.5*  --   --   --  1.2 1.1  AST 11*  --  10*  --   --   --  25 20  ALT 11  --  8  --   --   --  8 9  ALKPHOS 53  --  62  --   --   --  94 80  PROT 5.6*  --  5.4*  --   --   --  6.0* 5.7*  ALBUMIN 2.3*   < > 2.3*   < > 1.9* 2.0* 1.9* 2.1*   < > = values in this interval not displayed.    Assessment and Plan:  Perforated diverticulitis s/p exploratory laparoscopy and colostomy 12/29/20. Developed post-op intraabdominal fluid collection with IR drain placement 01/24/21  CT 02/01/21 showed resolution of the abscess and the patient presented to IR today for contrast drain injection under fluoroscopy. Drain injection showed fistula to the colon. Per Epic, output for the past 24 hours was 20 ml.   IR recommends to keep the drain to suction and discontinue flushing the drain. Please continue to document the output each shift and change the dressing as needed. IR will plan to perform a repeat drain injection in approximately one month.   Other plans per primary teams. Please call IR with any questions.    Electronically Signed: Soyla Dryer, AGACNP-BC [phone] 02/03/2021, 3:40 PM   I spent a total of 15 Minutes at the the patient's bedside AND on the patient's [name] floor or unit, greater than 50% of which was counseling/coordinating care for LLQ abscess drain care.

## 2021-02-03 NOTE — Care Management Important Message (Signed)
Important Message  Patient Details IM Letter given to the Patient. Name: April [name] MRN: [id number] Date of Birth: [birth year]   Medicare Important Message Given:  Yes     Kerin Salen 02/03/2021, 10:58 AM

## 2021-02-03 NOTE — Progress Notes (Addendum)
VQ:[id number]  Subjective: Frail, still not eating much.  Wants to go home when ready, says she is not getting out of bed or walking.  She has a wick in place.  She was seen by PT on 01/30/2021, and required total bed assistance, she could sit self-supporting initially but gradually required increased support. Her midline incision looks good.  There is a picture below.  Visible sutures are loose.  JP drain is just showing clear fluid mostly irrigation.  She will probably go down for drain injection by IR today with possible drain removal.  Objective: Vital signs in last 24 hours: Temp:  [97.5 F (36.4 C)-99.1 F (37.3 C)] 99.1 F (37.3 C) (03/13 2122) Pulse Rate:  [61-80] 61 (03/14 0512) Resp:  [17-20] 18 (03/14 0512) BP: (132-138)/(66-99) 132/66 (03/14 0512) SpO2:  [98 %-99 %] 98 % (03/14 0512) Last BM Date: 02/01/21 60 p.o. recorded 259 IV recorded 450 urine recorded Drain: 20 cc Stool: 75 Afebrile T-max 99.1.  Vital signs are stable, still on nasal cannula 2 L. BMP is stable creatinine 2.0 WBC 4.8 H/H 8.5/29.4 Platelets 80,000 Intake/Output from previous day: 03/13 0701 - 03/14 0700 In: 319.2 [P.O.:60; I.V.:259.2] Out: 545 [Urine:450; Drains:20; Stool:75] Intake/Output this shift: No intake/output data recorded.  General appearance: alert, cooperative, no distress and Frail, severely deconditioned, and cold in her room.  Wick in place. Resp: Clear anterior exam, still on nasal cannula. GI: Soft, midline incision with picture below.  Open wound is clean.  There are stitches loose with some clear serous drainage at the base.  Overall wound looks much better than when she came in.  Her ostomy is in place the ostomy looks good there is some stool in the bag.  Very little reported out yesterday.  Wound today  Wound 12/26/2020  Lab Results:  Recent Labs    02/02/21 0825 02/03/21 0512  WBC 7.0 4.8  HGB 9.1* 8.5*  HCT 31.5* 29.4*  PLT 90* 80*     BMET Recent Labs    02/02/21 0825 02/03/21 0512  NA 140 142  K 3.3* 3.5  CL 112* 113*  CO2 18* 17*  GLUCOSE 86 110*  BUN 55* 53*  CREATININE 2.14* 2.00*  CALCIUM 8.7* 8.7*   PT/INR No results for input(s): LABPROT, INR in the last 72 hours.  No results for input(s): AST, ALT, ALKPHOS, BILITOT, PROT, ALBUMIN in the last 168 hours.   Lipase     Component Value Date/Time   LIPASE 16 01/10/2021 1343     Medications: . (feeding supplement) PROSource Plus  30 mL Oral BID BM  . allopurinol  100 mg Oral Daily  . apixaban  5 mg Oral BID  . brimonidine  1 drop Both Eyes BID  . chlorhexidine  15 mL Mouth Rinse BID  . cycloSPORINE  1 drop Both Eyes BID  . dorzolamide  1 drop Both Eyes Daily  . dronabinol  2.5 mg Oral BID AC  . feeding supplement  237 mL Oral BID BM  . FLUoxetine  40 mg Oral Daily  . hydrocortisone   Rectal BID  . insulin aspart  0-5 Units Subcutaneous QHS  . insulin aspart  0-9 Units Subcutaneous TID WC  . latanoprost  1 drop Both Eyes QHS  . mouth rinse  15 mL Mouth Rinse q12n4p  . metoprolol tartrate  25 mg Oral BID  . multivitamin with minerals  1 tablet Oral Daily  . mycophenolate  180 mg Oral BID  .  nutrition supplement (JUVEN)  1 packet Oral BID BM  . predniSONE  7.5 mg Oral Q breakfast  . sodium chloride flush  3 mL Intravenous Q12H  . sodium chloride flush  5 mL Intracatheter Q8H  . tacrolimus  1 mg Oral BID   Anti-infectives (From admission, onward)   Start     Dose/Rate Route Frequency Ordered Stop   01/21/21 2200  vancomycin (VANCOCIN) 50 mg/mL oral solution 125 mg        125 mg Oral 4 times daily 01/21/21 2028 01/31/21 2159   01/21/21 1800  ceFEPIme (MAXIPIME) 2 g in sodium chloride 0.9 % 100 mL IVPB        2 g 200 mL/hr over 30 Minutes Intravenous Every 24 hours 12/30/2020 1742     01/21/21 0000  metroNIDAZOLE (FLAGYL) IVPB 500 mg        500 mg 100 mL/hr over 60 Minutes Intravenous Every 8 hours 01/04/2021 1821     01/01/2021 1800   vancomycin (VANCOCIN) IVPB 1000 mg/200 mL premix  Status:  Discontinued        1,000 mg 200 mL/hr over 60 Minutes Intravenous Every 48 hours 12/30/2020 1742 01/22/21 1053   12/25/2020 1545  ceFEPIme (MAXIPIME) 2 g in sodium chloride 0.9 % 100 mL IVPB        2 g 200 mL/hr over 30 Minutes Intravenous  Once 01/01/2021 1544 12/28/2020 1625   12/27/2020 1545  metroNIDAZOLE (FLAGYL) IVPB 500 mg        500 mg 100 mL/hr over 60 Minutes Intravenous  Once 12/26/2020 1544 01/14/2021 1730   01/18/2021 1545  vancomycin (VANCOCIN) IVPB 1000 mg/200 mL premix  Status:  Discontinued        1,000 mg 200 mL/hr over 60 Minutes Intravenous  Once 01/02/2021 1544 12/25/2020 1847      Assessment/Plan Polycystic disease/ESRDwith renal transplant 2013-Prograf/prednisone. Elevated Cr- 2.19>>2.09>>1.92(3/7)>>1.87>>1.84 >>2.14>>2.0 Hx CHF Chronic COPD/history of tobacco use- On 2L PRN at home -Mostly off CPAP now. HxAge indeterminate, non occlusive deep vein thrombosis involving the right popliteal vein Hx SVT/Atrial flutter with RVR/TEE/DC cardioversion 12/25/20  Large hematoma left arm - drained and dressed- cleaning up hematoma still present Anemia- H/H9.0/30 >> 8.5/29.4 Thrombocytopenia  -Platelets 80,000 Severe protein calorie malnutrition-Total protein 6.0/albumin 1.9 Hyponatremia -resolved NA 142 Hypercoagulable - INR 2.8 (on Eliquis) Severe malnutrition-prealbumin<5 C. difficile colitis -antigen/toxin + 01/21/2021, tx per medicine DNR/DNI >> plans to go home at discharge/palliative care, but not Hospice Gout -on allopurinol  Sepsis and 3.2 x 2.8 x 5.3 cm abdominal fluid/abscess collectionadjacent to enterotomy staple line HxPerforated sigmoid diverticulitis -S/pExploratory laparotomy with sigmoid colectomy/end colostomy, 12/29/2020 Dr. Coralie Keens -IR drain placed 3/4, cx showsmultiple organisms present, none predominant, no anaerobes isolated -cont local wound carewith mepitel over wound base  and NS WD dressing changes BID.   FEN:D3 diet, supplements EX:HBZJIRCV/ELFYBO 2/28>>day15, Vancomycin IV 2/28 - Vancomycin PO 3/1-3/11; - culture: MULTIPLE ORGANISMS PRESENT, NONE PREDOMINANT  NO ANAEROBES ISOLATED; FBP:ZWCHENI gtt  >> Eliquis 3/11   Plan: From our standpoint there is not a great deal more to do, except continue wet-to-dry dressing changes..  IR plans to reimage today.  They may possibly remove the drain.  The midline wound is clean and slowly improving.  Her biggest issues now have to do with how frail, malnourished, and deconditioned she is.    LOS: 14 days    JENNINGS,WILLARD 02/03/2021 Please see Amion

## 2021-02-03 NOTE — Progress Notes (Signed)
Physical Therapy Treatment Patient Details Name: April [name] MRN: [id number] DOB: 1955/12/07 Today's Date: 02/03/2021    History of Present Illness April [name] is a 65 y.o. female admitted on 01/01/2021 secondary to acute encephalopathy with associated hypotension in setting of intraabdominal fluid collection and recent intraabdominal surgery. General surgery and IR consulted for management.  Found to have C. Difficile.  Noted pt's with open abdominal wound. Recently in [facility] for new onset afib from 12/16/20-01/14/21 , DC to SNF. H/O Left hip and wrist fx 11/2019.  Pt with several recent admissions - review PT notes and PATIENT WAS AMBULATORY prior to 01/02/21.  PMH of atrial flutter s/p DCCV, chronic respiratory failure on 2 lpm of oxygen, asthma, possible OSA, CKD stage IIIa, ESRD s/p renal transplant, diastolic heart failure, diabetes, Raynaud's phenomenon.    PT Comments    The patient is very drowsy, minimal interaction. Patient  Did intermittently assist with AAROM exercises. Will attempt sitting bedside next visit if aroused and able to actively participate. No family present.  Follow Up Recommendations  SNF     Equipment Recommendations  Wheelchair (measurements PT);Wheelchair cushion (measurements PT);3in1 (PT);Hospital bed    Recommendations for Other Services       Precautions / Restrictions Precautions Precautions: Fall Precaution Comments: colostomy, open abd wound, left drain    Mobility  Bed Mobility               General bed mobility comments: NT    Transfers                    Ambulation/Gait                 Stairs             Wheelchair Mobility    Modified Rankin (Stroke Patients Only)       Balance                                            Cognition Arousal/Alertness: Lethargic Behavior During Therapy: Flat affect Overall Cognitive Status: No family/caregiver present to determine baseline  cognitive functioning                                 General Comments: patient mostly dozing during exercises, aroused when stimulated to participate in AAROM.      Exercises      General Comments        Pertinent Vitals/Pain Faces Pain Scale: Hurts even more Pain Location: buttocks, back Pain Descriptors / Indicators: Grimacing;Discomfort Pain Intervention(s): Monitored during session;Limited activity within patient's tolerance    Home Living                      Prior Function            PT Goals (current goals can now be found in the care plan section) Progress towards PT goals: Not progressing toward goals - comment (remains very frail,)    Frequency    Min 2X/week      PT Plan Current plan remains appropriate    Co-evaluation              AM-PAC PT "6 Clicks" Mobility   Outcome Measure  Help needed turning from your back to your side while in  a flat bed without using bedrails?: Total Help needed moving from lying on your back to sitting on the side of a flat bed without using bedrails?: Total Help needed moving to and from a bed to a chair (including a wheelchair)?: Total Help needed standing up from a chair using your arms (e.g., wheelchair or bedside chair)?: Total Help needed to walk in hospital room?: Total Help needed climbing 3-5 steps with a railing? : Total 6 Click Score: 6    End of Session Equipment Utilized During Treatment: Oxygen Activity Tolerance: Patient limited by fatigue Patient left: in bed;with call bell/phone within reach Nurse Communication: Mobility status;Need for lift equipment PT Visit Diagnosis: Muscle weakness (generalized) (M62.81);Pain;Unsteadiness on feet (R26.81)     Time: 9012-2241 PT Time Calculation (min) (ACUTE ONLY): 19 min  Charges:  $Therapeutic Exercise: 8-22 mins                    .   Claretha Cooper 02/03/2021, 1:26 PM

## 2021-02-04 DIAGNOSIS — G9341 Metabolic encephalopathy: Secondary | ICD-10-CM | POA: Diagnosis not present

## 2021-02-04 LAB — PREALBUMIN: Prealbumin: 21.3 mg/dL (ref 18–38)

## 2021-02-04 LAB — SEDIMENTATION RATE: Sed Rate: 29 mm/hr — ABNORMAL HIGH (ref 0–22)

## 2021-02-04 LAB — CBC
HCT: 34.3 % — ABNORMAL LOW (ref 36.0–46.0)
Hemoglobin: 9.8 g/dL — ABNORMAL LOW (ref 12.0–15.0)
MCH: 32.2 pg (ref 26.0–34.0)
MCHC: 28.6 g/dL — ABNORMAL LOW (ref 30.0–36.0)
MCV: 112.8 fL — ABNORMAL HIGH (ref 80.0–100.0)
Platelets: 72 K/uL — ABNORMAL LOW (ref 150–400)
RBC: 3.04 MIL/uL — ABNORMAL LOW (ref 3.87–5.11)
RDW: 24.6 % — ABNORMAL HIGH (ref 11.5–15.5)
WBC: 7.6 K/uL (ref 4.0–10.5)
nRBC: 0 % (ref 0.0–0.2)

## 2021-02-04 LAB — BASIC METABOLIC PANEL
Anion gap: 10 (ref 5–15)
BUN: 48 mg/dL — ABNORMAL HIGH (ref 8–23)
CO2: 19 mmol/L — ABNORMAL LOW (ref 22–32)
Calcium: 8.9 mg/dL (ref 8.9–10.3)
Chloride: 111 mmol/L (ref 98–111)
Creatinine, Ser: 1.95 mg/dL — ABNORMAL HIGH (ref 0.44–1.00)
GFR, Estimated: 28 mL/min — ABNORMAL LOW (ref >60)
Glucose, Bld: 120 mg/dL — ABNORMAL HIGH (ref 70–99)
Potassium: 3.5 mmol/L (ref 3.5–5.1)
Sodium: 140 mmol/L (ref 135–145)

## 2021-02-04 LAB — GLUCOSE, CAPILLARY
Glucose-Capillary: 113 mg/dL — ABNORMAL HIGH (ref 70–99)
Glucose-Capillary: 162 mg/dL — ABNORMAL HIGH (ref 70–99)
Glucose-Capillary: 197 mg/dL — ABNORMAL HIGH (ref 70–99)
Glucose-Capillary: 190 mg/dL — ABNORMAL HIGH (ref 70–99)

## 2021-02-04 LAB — C-REACTIVE PROTEIN: CRP: <0.5 mg/dL (ref <1.0)

## 2021-02-04 MED ORDER — AMOXICILLIN-POT CLAVULANATE 500-125 MG PO TABS
1.0000 | ORAL_TABLET | Freq: Two times a day (BID) | ORAL | Status: DC
  Administered 2021-02-04 (×2): 500 mg via ORAL
  Filled 2021-02-04 (×7): qty 1

## 2021-02-04 NOTE — Progress Notes (Signed)
Nutrition Follow-up  INTERVENTION:   -Continue to encourage PO intakes  - continue Ensure Enlive BID, Juven BID, Prosource Plus BID. - Magic Cup TID with meals, each supplement provides 290 kcal and 9 grams of protein. - monitor for [place] decisions.   NUTRITION DIAGNOSIS:   Increased nutrient needs related to acute illness,wound healing as evidenced by estimated needs.  Ongoing.  GOAL:   Patient will meet greater than or equal to 90% of their needs  Not meeting.  MONITOR:   PO intake,Supplement acceptance,Labs,Weight trends  ASSESSMENT:   65 y.o. 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 She was recently hospitalized for perforated diverticulitis and septic shock and underwent ex lap and colostomy. She presented to the ED after being found unresponsive at SNF with O2 sat of 74% on 4L O2. CT chest and abdomen/pelvis showed possible post-op seroma vs developing abscess. General Surgery consulted.  Patient currently consuming 0-25% of meals. Consumed 1/3 of some soup last night. Is not accepting Juven or Prosource supplements despite education from nursing. Pt is accepting at least 1 Ensure.  Per palliative care note, pt does not want a feeding tube.  Will keep encouraging POs.  Admission weight: 157 lbs. Current weight: 159 lbs.  Medications: Marinol, Multivitamin with minerals daily   Labs reviewed: CBGs: 113-178  Diet Order:   Diet Order            DIET DYS 3 Room service appropriate? Yes with Assist; Fluid consistency: Thin  Diet effective now                 EDUCATION NEEDS:   Not appropriate for education at this time  Skin:  Skin Integrity Issues:: Stage II,Other (Comment) Stage II: sacrum Other: MASD bilateral buttocks  Last BM:  3/7 (type 6 x1)  Height:   Ht  Readings from Last 1 Encounters:  12/31/2020 5\' 1"  (1.549 m)    Weight:   Wt Readings from Last 1 Encounters:  01/31/21 72.2 kg   BMI:  Body mass index is 30.08 kg/m.  Estimated Nutritional Needs:   Kcal:  1800-2000 kcal  Protein:  85-100 grams  Fluid:  >/= 1.5 L/day  Clayton Bibles, MS, RD, LDN Inpatient Clinical Dietitian Contact information available via Amion

## 2021-02-04 NOTE — Progress Notes (Signed)
OT Cancellation Note  Patient Details Name: April [name] MRN: [id number] DOB: 09/05/1956   Cancelled Treatment:    Reason Eval/Treat Not Completed: Fatigue/lethargy limiting ability to participate. Caregiver present at bedside states patient received morphine for dressing change prior to this therapists entry. Will check back as time allows.   Gloris Manchester OTR/L Supplemental OT, Department of rehab services [phone]  Lynwood Kubisiak R H. 02/04/2021, 1:13 PM

## 2021-02-04 NOTE — Progress Notes (Signed)
PROGRESS NOTE    April [name]  OVZ:[id number] DOB: 1956/05/13 DOA: 01/04/2021 PCP: [name], MD   Chief Complaint  Patient presents with  . Hypotension  . hypoxia   Brief Narrative: 65 year old female with medical history of A flutter status post, chronic respiratory failure on 2 L oxygen, asthma, OSA, CKD stage IIIa, ESRD post transplant, diastolic heart failure, diabetes presented secondary to acute encephalopathy with associated hypotension in setting of intraabdominal fluid collection and recent intraabdominal surgery. General surgery and IR was consulted for management. Patient managed empirically on antibiotics. Found to have C. Difficile and started on Vancomycin PO.  Status post IR drain placement 3/4-being managed by IR, general surgery.  Patient has colostomy and lower abdominal wall wound and managed by dressing changes. Patient remains very weak deconditioned with poor oral intake.  She is at risk of decompensation.  Palliative care on board she is DNR.  Subjective: Seen and examined this morning.  Patient complains of nausea has not been eating well remains deconditioned and easily frail. Afebrile overnight, lab work overall is stable  Assessment & Plan: Postop intra-abdominal fluid collection with abscess needing IR drain 3/4 after recent perforated diverticulitis for which she had exploratory laparoscopy and colostomy on 12/29/2020 Dr Marciano Sequin Repeat CT abdomen 3/11 interval resolution of the left lower quadrant collection.  Drain remain in place IR advised to keep the drain to suction and discontinue flushing the drain and continue to document output each shift and change the dressing as needed I have plan to perform a repeat injection in approximately 1 month Appreciate ID input antibiotic changed to Augmentin 875 twice daily x10 days and monitor output of fistula to see if need to extend. Will get er/crp.  Continue the wound dressing as per surgery  recommendation. Continue augment diet  C. difficile colitis positive antigen/toxin 3/1: Mild symptoms, ID has advised to keep vancomycin x14 days.   Acute on chronic diastolic CHF/Anasarca:Volume status is stable.  Off Lasix due to AKI 01/31/21.  Hold off on Lasix unless She has edema or any respiratory issues Net IO Since Admission: -5,471.79 mL [02/04/21 1136]  Polycystic kidney disease ESRD hx s/p renal transplant 2013 on immunosuppressant and chronic steroid AKI on CKD stage III: Creatinine was uptrending 2.1 BUT now down to 1.9 BUN 48 with IV fluids.  Lasix remains on hold.Continue her Prograf, cellcept,and prednisone-and will need her to follow-up with her nephrology as per outpatient. Recent Labs  Lab 01/31/21 0555 02/01/21 0546 02/02/21 0825 02/03/21 0512 02/04/21 0545  BUN 62* 59* 55* 53* 48*  CREATININE 2.06* 2.15* 2.14* 2.00* 1.95*   History of adrenal insufficiency on her prednisone 7.5 mg.  Acute on chronic respiratory failure with hypoxia: Doing well on 2 L nasal cannula.  Encourage PT OT incentive spirometry but patient still has been very deconditioned and bedbound   Acute metabolic encephalopathy: Patient appears alert awake and oriented,but very deconditioned and frail.   Hypotension blood pressure is stable.   Acute urine retention: now Voiding, purewick in place  Acute on chronic anemia/Normocytic anemia/Iron deficiency anemia Large hematoma Left arm drained and dressed Anemia in the setting of recent abdominal surgery left arm hematoma.  Hemoglobin remained stable 9.8 g.  Monitor Recent Labs  Lab 01/31/21 0555 02/01/21 0546 02/02/21 0825 02/03/21 0512 02/04/21 0545  HGB 9.0* 9.3* 9.1* 8.5* 9.8*  HCT 30.9* 31.1* 31.5* 29.4* 34.3*   Typical atrial flutter, TEE/cardioversion 2/6 and in sinus and has been on Eliquis- was on IV heparin  but now back on Eliquis.  Continue metoprolol twice daily.   T2DM: Blood sugar w/ a episode of hypoglycemia 66 on 3/9  am-CBG was low due to poor intake and we have   discontinued Lantus altogether 3/12 keep on SSI for now while oral intake remains marginal. Recent Labs  Lab 02/03/21 0804 02/03/21 1215 02/03/21 1709 02/03/21 2109 02/04/21 0732  GLUCAP 84 121* 143* 106* 269*   Metabolic acidosis with low bicarb.  Resolved. Depression continue her Prozac appears depressed and deconditioned  Cont her allopurinol,  Goals of care: DNR.Palliative care following.  Given ocmplex comorbidities, prognosis appears guarded, at risk of decompensation. she is very deconditioned and frail and with poor appttite-SNF has been recommended but patient and family at this time wanting to return home.  I am afraid she may need home with hospice eventually down the line given her overall frailty.  MOST form was completed by palliative care and per patient's wishes sees DNR, limited additional intervention and antibiotics if indicated but no feeding tube.  Diet Order            DIET DYS 3 Room service appropriate? Yes with Assist; Fluid consistency: Thin  Diet effective now                 Nutrition Problem: Increased nutrient needs Etiology: acute illness,wound healing Signs/Symptoms: estimated needs Interventions: Juven,Magic cup,Ensure Enlive (each supplement provides 350kcal and 20 grams of protein),Prostat Patient's Body mass index is 30.08 kg/m.  DVT prophylaxis: Heprain iv Code Status:   Code Status: DNR Family Communication: plan of care discussed with patient. Daughter aware about overall condition and guarded prognosis  Status is: Inpatient Remains inpatient appropriate because:IV treatments appropriate due to intensity of illness or inability to take PO and Inpatient level of care appropriate due to severity of illness  Dispo: The patient is from: Home              Anticipated d/c is to: Home with [facility]              Patient currently is not medically stable to d/c.   Difficult to place patient No    Unresulted Labs (From admission, onward)          Start     Ordered   02/10/21 0000  Prealbumin  Weekly,   R     Question:  Specimen collection method  Answer:  Lab=Lab collect   02/04/21 0745   02/03/21 0500  CBC  Daily,   R     Question:  Specimen collection method  Answer:  Lab=Lab collect   02/02/21 0746   02/03/21 4854  Basic metabolic panel  Daily,   R     Question:  Specimen collection method  Answer:  Lab=Lab collect   02/02/21 0746         Medications reviewed:  Scheduled Meds: . (feeding supplement) PROSource Plus  30 mL Oral BID BM  . allopurinol  100 mg Oral Daily  . amoxicillin-clavulanate  1 tablet Oral BID  . apixaban  5 mg Oral BID  . brimonidine  1 drop Both Eyes BID  . chlorhexidine  15 mL Mouth Rinse BID  . cycloSPORINE  1 drop Both Eyes BID  . dorzolamide  1 drop Both Eyes Daily  . dronabinol  2.5 mg Oral BID AC  . feeding supplement  237 mL Oral BID BM  . FLUoxetine  40 mg Oral Daily  . hydrocortisone   Rectal BID  .  insulin aspart  0-5 Units Subcutaneous QHS  . insulin aspart  0-9 Units Subcutaneous TID WC  . latanoprost  1 drop Both Eyes QHS  . mouth rinse  15 mL Mouth Rinse q12n4p  . metoprolol tartrate  25 mg Oral BID  . multivitamin with minerals  1 tablet Oral Daily  . mycophenolate  180 mg Oral BID  . nutrition supplement (JUVEN)  1 packet Oral BID BM  . predniSONE  7.5 mg Oral Q breakfast  . sodium chloride flush  3 mL Intravenous Q12H  . tacrolimus  1 mg Oral BID  . vancomycin  125 mg Oral Q12H   Continuous Infusions: . sodium chloride 10 mL/hr at 01/28/21 0832  . sodium chloride Stopped (01/30/21 0105)  . promethazine (PHENERGAN) injection      Consultants:see note  Procedures:see note  Antimicrobials: Anti-infectives (From admission, onward)   Start     Dose/Rate Route Frequency Ordered Stop   02/04/21 1000  amoxicillin-clavulanate (AUGMENTIN) 500-125 MG per tablet 500 mg        1 tablet Oral 2 times daily 02/04/21 0815  02/14/21 0959   02/03/21 2200  vancomycin (VANCOCIN) 50 mg/mL oral solution 125 mg        125 mg Oral Every 12 hours 02/03/21 1548 02/18/21 0959   01/21/21 2200  vancomycin (VANCOCIN) 50 mg/mL oral solution 125 mg        125 mg Oral 4 times daily 01/21/21 2028 01/31/21 2159   01/21/21 1800  ceFEPIme (MAXIPIME) 2 g in sodium chloride 0.9 % 100 mL IVPB  Status:  Discontinued        2 g 200 mL/hr over 30 Minutes Intravenous Every 24 hours 12/25/2020 1742 02/04/21 0815   01/21/21 0000  metroNIDAZOLE (FLAGYL) IVPB 500 mg  Status:  Discontinued        500 mg 100 mL/hr over 60 Minutes Intravenous Every 8 hours 01/13/2021 1821 02/04/21 0815   01/09/2021 1800  vancomycin (VANCOCIN) IVPB 1000 mg/200 mL premix  Status:  Discontinued        1,000 mg 200 mL/hr over 60 Minutes Intravenous Every 48 hours 12/27/2020 1742 01/22/21 1053   12/30/2020 1545  ceFEPIme (MAXIPIME) 2 g in sodium chloride 0.9 % 100 mL IVPB        2 g 200 mL/hr over 30 Minutes Intravenous  Once 01/14/2021 1544 12/30/2020 1625   01/16/2021 1545  metroNIDAZOLE (FLAGYL) IVPB 500 mg        500 mg 100 mL/hr over 60 Minutes Intravenous  Once 01/13/2021 1544 01/08/2021 1730   01/03/2021 1545  vancomycin (VANCOCIN) IVPB 1000 mg/200 mL premix  Status:  Discontinued        1,000 mg 200 mL/hr over 60 Minutes Intravenous  Once 12/24/2020 1544 12/30/2020 1847     Culture/Microbiology    Component Value Date/Time   SDES  01/24/2021 1308    ABSCESS Performed at [facility], [place] [address], [place], [place] [address]    SPECREQUEST  01/24/2021 1308    NONE Performed at [facility], [place] [address] [place], [place] [address]    CULT (A) 01/24/2021 1308    MULTIPLE ORGANISMS PRESENT, NONE PREDOMINANT NO ANAEROBES ISOLATED Performed at [facility], [place] [address] [place], [place] [address]    REPTSTATUS 01/30/2021 FINAL 01/24/2021 1308    Other culture-see note  Objective: Vitals: Today's Vitals   02/03/21  2000 02/03/21 2106 02/04/21 0509 02/04/21 0800  BP:  (!) 150/77 125/69  Pulse:  82 78   Resp:  16 16   Temp:  (!) 97.5 F (36.4 C) (!) 97.4 F (36.3 C)   TempSrc:  Oral Oral   SpO2:  100% 100%   Weight:      Height:      PainSc: 0-No pain   0-No pain    Intake/Output Summary (Last 24 hours) at 02/04/2021 1136 Last data filed at 02/04/2021 0521 Gross per 24 hour  Intake 120 ml  Output 301 ml  Net -181 ml   Filed Weights   01/29/21 0500 01/30/21 0433 01/31/21 0624  Weight: 70.3 kg 72 kg 72.2 kg   Weight change:   Intake/Output from previous day: 03/14 0701 - 03/15 0700 In: 120 [P.O.:120] Out: 1101 [Urine:1075; Stool:26] Intake/Output this shift: No intake/output data recorded. Filed Weights   01/29/21 0500 01/30/21 0433 01/31/21 0624  Weight: 70.3 kg 72 kg 72.2 kg    Examination:  General exam: Alert awake but appears ill, older than his stated age, on [place] HEENT:Oral mucosa moist, Ear/Nose WNL grossly, dentition normal. Respiratory system: bilaterally diminishedd,no wheezing or crackles,no use of accessory muscle Cardiovascular system: S1 & S2 +, No JVD,. Gastrointestinal system: Abdomen soft, wound with dressing intact and lower abdomen, colostomy in place with stool output, left lower quadrant JP drain in PLACE, bs+, mild tenderness Nervous System:Alert, awake, moving extremities and grossly nonfocal but deconditioned very weak Extremities: Trace edema, distal peripheral pulses palpable.  Skin: No rashes,no icterus. MSK: thin muscle bulk,tone, power  Data Reviewed: I have personally reviewed following labs and imaging studies CBC: Recent Labs  Lab 01/31/21 0555 02/01/21 0546 02/02/21 0825 02/03/21 0512 02/04/21 0545  WBC 6.5 7.4 7.0 4.8 7.6  HGB 9.0* 9.3* 9.1* 8.5* 9.8*  HCT 30.9* 31.1* 31.5* 29.4* 34.3*  MCV 108.4* 107.6* 112.1* 112.2* 112.8*  PLT 110* 93* 90* 80* 72*   Basic Metabolic Panel: Recent Labs  Lab 01/31/21 0555 02/01/21 0546  02/02/21 0825 02/03/21 0512 02/04/21 0545  NA 140 139 140 142 140  K 3.3* 3.8 3.3* 3.5 3.5  CL 107 107 112* 113* 111  CO2 22 20* 18* 17* 19*  GLUCOSE 89 77 86 110* 120*  BUN 62* 59* 55* 53* 48*  CREATININE 2.06* 2.15* 2.14* 2.00* 1.95*  CALCIUM 8.6* 8.5* 8.7* 8.7* 8.9   GFR: Estimated Creatinine Clearance: 26.9 mL/min (A) (by C-G formula based on SCr of 1.95 mg/dL (H)). Liver Function Tests: No results for input(s): AST, ALT, ALKPHOS, BILITOT, PROT, ALBUMIN in the last 168 hours. No results for input(s): LIPASE, AMYLASE in the last 168 hours. No results for input(s): AMMONIA in the last 168 hours. Coagulation Profile: No results for input(s): INR, PROTIME in the last 168 hours. Cardiac Enzymes: No results for input(s): CKTOTAL, CKMB, CKMBINDEX, TROPONINI in the last 168 hours. BNP (last 3 results) No results for input(s): PROBNP in the last 8760 hours. HbA1C: No results for input(s): HGBA1C in the last 72 hours. CBG: Recent Labs  Lab 02/03/21 0804 02/03/21 1215 02/03/21 1709 02/03/21 2109 02/04/21 0732  GLUCAP 84 121* 143* 178* 113*   Lipid Profile: No results for input(s): CHOL, HDL, LDLCALC, TRIG, CHOLHDL, LDLDIRECT in the last 72 hours. Thyroid Function Tests: No results for input(s): TSH, T4TOTAL, FREET4, T3FREE, THYROIDAB in the last 72 hours. Anemia Panel: No results for input(s): VITAMINB12, FOLATE, FERRITIN, TIBC, IRON, RETICCTPCT in the last 72 hours. Sepsis Labs: No results for input(s): PROCALCITON, LATICACIDVEN in the last 168 hours.  No results found  for this or any previous visit (from the past 240 hour(s)).   Radiology Studies: IR Sinus/Fist Tube Chk-Non GI  Result Date: 02/03/2021 CLINICAL DATA:  65 year old female with history of diverticular abscess and left lower quadrant pigtail drainage catheter placement on 01/24/2021. EXAM: SINUS TRACT INJECTION/FISTULOGRAM COMPARISON:  01/24/2021 CONTRAST:  10 mL Omnipaque 300-administered via the existing  percutaneous drain. FLUOROSCOPY TIME:  42 seconds, 5 mGy TECHNIQUE: The patient was positioned supine on the fluoroscopy table. A preprocedural spot fluoroscopic image was obtained of the left lower quadrant and the existing percutaneous drainage catheter. Multiple spot fluoroscopic and radiographic images were obtained following the injection of a small amount of contrast via the existing percutaneous drainage catheter. FINDINGS: Free flow of contrast material into the left pericolic gutter as well as tracking medially and inferiorly in the pelvis. Delayed images demonstrate opacification of the sigmoid colon and rectum. 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 PLAN: Keep drainage catheter to bulb suction. IR will continue to follow while inpatient. Plan to follow up with repeat drain injection in 1 month. Ruthann Cancer, MD [facility] Electronically Signed   By: Ruthann Cancer MD   On: 02/03/2021 12:05     LOS: 59 days   Antonieta Pert, MD [facility]  02/04/2021, 11:36 AM

## 2021-02-04 NOTE — Progress Notes (Signed)
PT Cancellation Note  Patient Details Name: NOORAH GIAMMONA MRN: [id number] DOB: 1956-02-16   Cancelled Treatment:    Reason Eval/Treat Not Completed: Fatigue/lethargy limiting ability to participate. Patient recently recieved morphine after dressing change. Will check back another time.   Claretha Cooper 02/04/2021, 1:18 PM [place] Pager [phone] Office [phone]

## 2021-02-04 NOTE — Progress Notes (Addendum)
Patient refusing to eat. Tried to give marinol and patient spit medication out. Patient refused to be turned and wanted to lay flat. Patient is also refusing her prosource and juven. Will drink approximately one ensure a day.

## 2021-02-05 DIAGNOSIS — R531 Weakness: Secondary | ICD-10-CM

## 2021-02-05 DIAGNOSIS — Z7189 Other specified counseling: Secondary | ICD-10-CM | POA: Diagnosis not present

## 2021-02-05 DIAGNOSIS — G9341 Metabolic encephalopathy: Secondary | ICD-10-CM | POA: Diagnosis not present

## 2021-02-05 DIAGNOSIS — Z515 Encounter for palliative care: Secondary | ICD-10-CM | POA: Diagnosis not present

## 2021-02-05 LAB — GLUCOSE, CAPILLARY: Glucose-Capillary: 105 mg/dL — ABNORMAL HIGH (ref 70–99)

## 2021-02-05 MED ORDER — LORAZEPAM 2 MG/ML IJ SOLN
0.5000 mg | INTRAMUSCULAR | Status: DC | PRN
  Administered 2021-02-06 – 2021-02-07 (×3): 0.5 mg via INTRAVENOUS
  Filled 2021-02-05 (×3): qty 1

## 2021-02-05 MED ORDER — HYDROMORPHONE HCL 1 MG/ML IJ SOLN
0.5000 mg | INTRAMUSCULAR | Status: DC | PRN
  Administered 2021-02-06 – 2021-02-07 (×2): 1 mg via INTRAVENOUS
  Filled 2021-02-05 (×3): qty 1

## 2021-02-05 NOTE — Progress Notes (Signed)
Pt refusing wound care on her sacrum and coccyx.  Pt states , " I do not want any more medicines.  I do not want my dressings changed".  Daughter is expressing comfort care at this point.. Notified MD and will call palliative to come see family and patient.

## 2021-02-05 NOTE — Progress Notes (Signed)
OT Cancellation Note  Patient Details Name: ISABELLAMARIE RANDA MRN: [id number] DOB: March 13, 1956   Cancelled Treatment:    Reason Eval/Treat Not Completed: Other (comment). Rn reports switch to comfort care and that patient will refuse. OT will sign off. If patient's GOC change please re-order as needed.  Beila Purdie L Philomena Buttermore 02/05/2021, 12:13 PM

## 2021-02-05 NOTE — Progress Notes (Signed)
PROGRESS NOTE    April [name]  YTK:[id number] DOB: 1956/05/09 DOA: 12/26/2020 PCP: [name], MD   Chief Complaint  Patient presents with  . Hypotension  . hypoxia   Brief Narrative: 65 year old female with medical history of A flutter status post, chronic respiratory failure on 2 L oxygen, asthma, OSA, CKD stage IIIa, ESRD post transplant, diastolic heart failure, diabetes presented secondary to acute encephalopathy with associated hypotension in setting of intraabdominal fluid collection and recent intraabdominal surgery. General surgery and IR was consulted for management. Patient managed empirically on antibiotics. Found to have C. Difficile and started on Vancomycin PO.  Status post IR drain placement 3/4-being managed by IR, general surgery.  Patient has colostomy and lower abdominal wall wound and managed by dressing changes. Patient remains very weak deconditioned with poor oral intake.  She is at risk of decompensation.  Palliative care on board she is DNR.  Subjective: Seen and examined this am Patient appears much more weak frail and deconditioned barely eating anything, refusing labs/ po. Daughter at bedside, tearful  Assessment & Plan: Postop intra-abdominal fluid collection with abscess needing IR drain 3/4 after recent perforated diverticulitis for which she had exploratory laparoscopy and colostomy on 12/29/2020 Dr Marciano Sequin Repeat CT abdomen 3/11 interval resolution of the left lower quadrant collection.  Drain remain in place IR advised to keep the drain to suction and discontinue flushing the drain and continue to document output each shift and change the dressing as needed  IR plans to perform a repeat injection in approximately 1 month Appreciate ID input antibiotic changed to Augmentin 875 twice daily x10 days and monitor output of fistula to see if need to extend. Esr/crp stable.  Continue dressing care wound care  Failure to thrive with deconditioning/Poor  appetite/very poor intake: Has been drinking Ensure but at times minimal daughter reports, she was able to drink 2 ensures yesterday with son at the bedside.  Barely eating anything today.  Patient remains at high risk of decompensation.  She has voiced not to have any kind of tube feeding with palliative.  Daughter reports ever since her extubation she has not perked up.  C. difficile colitis positive antigen/toxin 3/1:ID has advised to keep vancomycin x14 days.  Semi-solid stool in the colostomy.   Acute on chronic diastolic CHF/Anasarca:Volume status is stable.  Off Lasix due to AKI 01/31/21.  Hold off on Lasix unless She has edema or any respiratory issues.  Positional edema mostly on the left upper extremity. Net IO Since Admission: -5,621.79 mL [02/05/21 1325]  Acute on chronic respiratory failure with hypoxia: Doing well on 2 L nasal cannula.  Encourage PT OT incentive spirometry but patient still has been very deconditioned and bedbound   Acute metabolic encephalopathy: Patient appears very lethargic weak and frail this morning.   AKI on CKD stage IIIa/Metabolic acodisis- baseline creatinine variable 0.9-1.8 in feb: ESRD hx s/p renal transplant 2013 on immunosuppressant and chronic steroid/PCKD Creatinine ~1.9. BUN 62>48.Off lasix. Poor po intake, I am afraid she will head into AKI sooner or later if not taking po well.She is on Prograf, mycophenolate,and prednisone.Tacrolimus level 6.0 on 12/27/20. Recent Labs  Lab 01/31/21 0555 02/01/21 0546 02/02/21 0825 02/03/21 0512 02/04/21 0545  BUN 62* 59* 55* 53* 48*  CREATININE 2.06* 2.15* 2.14* 2.00* 1.95*   History of adrenal insufficiency on her prednisone 7.5 mg.  Hypotension blood pressure is stable currently.    Acute urine retention: now Voiding, purewick in place  Thrombocytopenia: In the setting  of acute illness,?  Etiology, platelet downtrending.  No evidence of bleeding. Recent Labs  Lab 01/31/21 0555 02/01/21 0546  02/02/21 0825 02/03/21 0512 02/04/21 0545  PLT 110* 93* 90* 80* 72*   Acute on chronic anemia/Normocytic anemia/Iron deficiency anemia Large hematoma Left arm drained and dressed Anemia in the setting of recent abdominal surgery left arm hematoma.  Overall stable.  Recent Labs  Lab 01/31/21 0555 02/01/21 0546 02/02/21 0825 02/03/21 0512 02/04/21 0545  HGB 9.0* 9.3* 9.1* 8.5* 9.8*  HCT 30.9* 31.1* 31.5* 29.4* 34.3*   Typical atrial flutter, TEE/cardioversion 2/6 and in sinus and has been on Eliquis- was on IV heparin but now back on Eliquis, on metoprolol.  T2DM: Blood sugar w/ a episode of hypoglycemia 65 on 3/9 am-CBG was low due to poor intake and Lantus was discontinued altogether 3/12, keep on SSI for now while oral intake remains marginal. Recent Labs  Lab 02/04/21 0732 02/04/21 1158 02/04/21 1702 02/04/21 2017 02/05/21 0730  GLUCAP 113* 162* 197* 696* 295*   Metabolic acidosis with low bicarb.  Resolved. Depression continue her Prozac appears depressed and deconditioned Raynad's Possible OSA Gout on allopurinol here. On colchicine as OP too.  Goals of care:DNR.Palliative care following.  Given complex comorbidities, prognosis appears guarded, at risk of decompensation.  Appears more confused weak frail lethargic today.  Refusing labs p.o. intake and medication.  Notified palliative care patient's daughter voices interest in hospice and will have palliative care engage again for further course.MOST form was completed -DNR, limited additional intervention and antibiotics if indicated but no feeding tube.  Diet Order            DIET DYS 3 Room service appropriate? Yes with Assist; Fluid consistency: Thin  Diet effective now                 Nutrition Problem: Increased nutrient needs Etiology: acute illness,wound healing Signs/Symptoms: estimated needs Interventions: Juven,Magic cup,Ensure Enlive (each supplement provides 350kcal and 20 grams of  protein),Prostat Patient's Body mass index is 30.08 kg/m.  DVT prophylaxis: Heprain iv Code Status:   Code Status: DNR Family Communication: plan of care discussed with patient. Discussed with the daughter at the bedside.  Status is: Inpatient Remains inpatient appropriate because:IV treatments appropriate due to intensity of illness or inability to take PO and Inpatient level of care appropriate due to severity of illness  Dispo: The patient is from: Home              Anticipated d/c is to: To be decided,              Patient currently is not medically stable to d/c.   Difficult to place patient No   Unresulted Labs (From admission, onward)          Start     Ordered   02/10/21 0000  Prealbumin  Weekly,   R     Question:  Specimen collection method  Answer:  Lab=Lab collect   02/04/21 0745         Medications reviewed:  Scheduled Meds: . (feeding supplement) PROSource Plus  30 mL Oral BID BM  . allopurinol  100 mg Oral Daily  . amoxicillin-clavulanate  1 tablet Oral BID  . apixaban  5 mg Oral BID  . brimonidine  1 drop Both Eyes BID  . chlorhexidine  15 mL Mouth Rinse BID  . cycloSPORINE  1 drop Both Eyes BID  . dorzolamide  1 drop Both Eyes Daily  .  dronabinol  2.5 mg Oral BID AC  . feeding supplement  237 mL Oral BID BM  . FLUoxetine  40 mg Oral Daily  . hydrocortisone   Rectal BID  . insulin aspart  0-5 Units Subcutaneous QHS  . insulin aspart  0-9 Units Subcutaneous TID WC  . latanoprost  1 drop Both Eyes QHS  . mouth rinse  15 mL Mouth Rinse q12n4p  . metoprolol tartrate  25 mg Oral BID  . multivitamin with minerals  1 tablet Oral Daily  . mycophenolate  180 mg Oral BID  . nutrition supplement (JUVEN)  1 packet Oral BID BM  . predniSONE  7.5 mg Oral Q breakfast  . sodium chloride flush  3 mL Intravenous Q12H  . tacrolimus  1 mg Oral BID  . vancomycin  125 mg Oral Q12H   Continuous Infusions: . sodium chloride 10 mL/hr at 01/28/21 0832  . sodium chloride  Stopped (01/30/21 0105)  . promethazine (PHENERGAN) injection      Consultants:see note  Procedures:see note  Antimicrobials: Anti-infectives (From admission, onward)   Start     Dose/Rate Route Frequency Ordered Stop   02/04/21 1000  amoxicillin-clavulanate (AUGMENTIN) 500-125 MG per tablet 500 mg        1 tablet Oral 2 times daily 02/04/21 0815 02/14/21 0959   02/03/21 2200  vancomycin (VANCOCIN) 50 mg/mL oral solution 125 mg        125 mg Oral Every 12 hours 02/03/21 1548 02/18/21 0959   01/21/21 2200  vancomycin (VANCOCIN) 50 mg/mL oral solution 125 mg        125 mg Oral 4 times daily 01/21/21 2028 01/31/21 2159   01/21/21 1800  ceFEPIme (MAXIPIME) 2 g in sodium chloride 0.9 % 100 mL IVPB  Status:  Discontinued        2 g 200 mL/hr over 30 Minutes Intravenous Every 24 hours 01/09/2021 1742 02/04/21 0815   01/21/21 0000  metroNIDAZOLE (FLAGYL) IVPB 500 mg  Status:  Discontinued        500 mg 100 mL/hr over 60 Minutes Intravenous Every 8 hours 12/25/2020 1821 02/04/21 0815   01/11/2021 1800  vancomycin (VANCOCIN) IVPB 1000 mg/200 mL premix  Status:  Discontinued        1,000 mg 200 mL/hr over 60 Minutes Intravenous Every 48 hours 12/26/2020 1742 01/22/21 1053   01/11/2021 1545  ceFEPIme (MAXIPIME) 2 g in sodium chloride 0.9 % 100 mL IVPB        2 g 200 mL/hr over 30 Minutes Intravenous  Once 01/05/2021 1544 12/26/2020 1625   12/24/2020 1545  metroNIDAZOLE (FLAGYL) IVPB 500 mg        500 mg 100 mL/hr over 60 Minutes Intravenous  Once 12/24/2020 1544 12/31/2020 1730   01/15/2021 1545  vancomycin (VANCOCIN) IVPB 1000 mg/200 mL premix  Status:  Discontinued        1,000 mg 200 mL/hr over 60 Minutes Intravenous  Once 01/01/2021 1544 01/01/2021 1847     Culture/Microbiology    Component Value Date/Time   SDES  01/24/2021 1308    ABSCESS Performed at [facility], [place] [address], [place], [place] [address]    SPECREQUEST  01/24/2021 1308    NONE Performed at [facility], [place] [address], [place], [place] [address]    CULT (A) 01/24/2021 1308    MULTIPLE ORGANISMS PRESENT, NONE PREDOMINANT NO ANAEROBES ISOLATED Performed at [facility], [place] [address] [place], [place] [address]  REPTSTATUS 01/30/2021 FINAL 01/24/2021 1308    Other culture-see note  Objective: Vitals: Today's Vitals   02/04/21 2015 02/04/21 2311 02/05/21 0526 02/05/21 0822  BP: 135/88  (!) 164/67 140/90  Pulse: 89  73 90  Resp: 16  14   Temp: 98.3 F (36.8 C)  97.7 F (36.5 C) (!) 97.5 F (36.4 C)  TempSrc: Oral  Oral Axillary  SpO2: 99%  100% 98%  Weight:      Height:      PainSc:  0-No pain      Intake/Output Summary (Last 24 hours) at 02/05/2021 1325 Last data filed at 02/04/2021 1816 Gross per 24 hour  Intake --  Output 150 ml  Net -150 ml   Filed Weights   01/29/21 0500 01/30/21 0433 01/31/21 0624  Weight: 70.3 kg 72 kg 72.2 kg   Weight change:   Intake/Output from previous day: 03/15 0701 - 03/16 0700 In: -  Out: 150 [Stool:150] Intake/Output this shift: No intake/output data recorded. Filed Weights   01/29/21 0500 01/30/21 0433 01/31/21 0624  Weight: 70.3 kg 72 kg 72.2 kg    Examination: General exam:weak frail lethargic. HEENT:Oral mucosa moist, Ear/Nose WNL grossly, dentition normal. Respiratory system: bilaterally diminishedd,no wheezing or crackles,no use of accessory muscle. Cardiovascular system: S1 & S2 +, No JVD. Gastrointestinal system: Abdomen soft, NT,ND, BS+ Nervous System:weak frail and lethargic today.  Extremities: lue edematous,distal peripheral pulses palpable.  Skin: No rashes,no icterus. MSK: thin muscle bulk,tone, power.  Data Reviewed: I have personally reviewed following labs and imaging studies CBC: Recent Labs  Lab 01/31/21 0555 02/01/21 0546 02/02/21 0825 02/03/21 0512 02/04/21 0545  WBC 6.5 7.4 7.0 4.8 7.6  HGB 9.0* 9.3* 9.1* 8.5* 9.8*  HCT 30.9* 31.1* 31.5* 29.4* 34.3*  MCV 108.4* 107.6*  112.1* 112.2* 112.8*  PLT 110* 93* 90* 80* 72*   Basic Metabolic Panel: Recent Labs  Lab 01/31/21 0555 02/01/21 0546 02/02/21 0825 02/03/21 0512 02/04/21 0545  NA 140 139 140 142 140  K 3.3* 3.8 3.3* 3.5 3.5  CL 107 107 112* 113* 111  CO2 22 20* 18* 17* 19*  GLUCOSE 89 77 86 110* 120*  BUN 62* 59* 55* 53* 48*  CREATININE 2.06* 2.15* 2.14* 2.00* 1.95*  CALCIUM 8.6* 8.5* 8.7* 8.7* 8.9   GFR: Estimated Creatinine Clearance: 26.9 mL/min (A) (by C-G formula based on SCr of 1.95 mg/dL (H)). Liver Function Tests: No results for input(s): AST, ALT, ALKPHOS, BILITOT, PROT, ALBUMIN in the last 168 hours. No results for input(s): LIPASE, AMYLASE in the last 168 hours. No results for input(s): AMMONIA in the last 168 hours. Coagulation Profile: No results for input(s): INR, PROTIME in the last 168 hours. Cardiac Enzymes: No results for input(s): CKTOTAL, CKMB, CKMBINDEX, TROPONINI in the last 168 hours. BNP (last 3 results) No results for input(s): PROBNP in the last 8760 hours. HbA1C: No results for input(s): HGBA1C in the last 72 hours. CBG: Recent Labs  Lab 02/04/21 0732 02/04/21 1158 02/04/21 1702 02/04/21 2017 02/05/21 0730  GLUCAP 113* 162* 197* 190* 105*   Lipid Profile: No results for input(s): CHOL, HDL, LDLCALC, TRIG, CHOLHDL, LDLDIRECT in the last 72 hours. Thyroid Function Tests: No results for input(s): TSH, T4TOTAL, FREET4, T3FREE, THYROIDAB in the last 72 hours. Anemia Panel: No results for input(s): VITAMINB12, FOLATE, FERRITIN, TIBC, IRON, RETICCTPCT in the last 72 hours. Sepsis Labs: No results for input(s): PROCALCITON, LATICACIDVEN in the last 168 hours.  No results found for this or any previous  visit (from the past 240 hour(s)).   Radiology Studies: No results found.   LOS: 16 days   Antonieta Pert, MD [facility]  02/05/2021, 1:25 PM

## 2021-02-05 NOTE — Progress Notes (Signed)
Physical Therapy Discharge Patient Details Name: April [name] MRN: [id number] DOB: 1956-04-10 Today's Date: 02/05/2021 Time:  -     Patient discharged from PT services secondary to medical decline - will need to re-order PT to resume therapy services.daughter requesting comfort care measures.  Please see latest therapy progress note for current level of functioning and progress toward goals.      GP     Claretha Cooper 02/05/2021, 12:14 PM [place] Pager [phone] Office [phone]

## 2021-02-05 NOTE — Consult Note (Signed)
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 This was complicated by intra-abdominal diverticular abscess s/p LLQ pigtail catheter placement, as well as development of colonic fistula and C diff infection. She has had a lengthy hospital course with IV antibiotics and aggressive wound care. Palliative care team is familiar with her. Previously goal was to get home with home health to continue wound care and managing her infections with antibiotics. However, she has become progressively weaker and has had minimal PO intake over the last several days. Palliative care was re-consulted to discuss with patient and family, as April [name]' [place] have now changed.   We went to see April [name] at bedside. She was accompanied by her daughter, April [name], who helps make April [name]'s healthcare decisions.  We re-introduced palliative care Palliative medicine as specialized medical care for people living with serious illness. It focuses on providing relief from the symptoms and stress of a serious illness. The goal is to improve quality of life for both the patient and the family.   When we asked April [name] how she was doing, she replied "I'm ready to die." The remainder of the conversation was primarily through April [name]. She noticed a significant change in her mother's demeanor and level of alertness as of this morning. She notes there has been a steady decline over the last few days, and she has not had hardly any PO intake.   We discussed what is  entailed with switching to comfort care, including discontinuing antibiotics, labs, and any medications not contributing to symptom control. April [name] and her family are in agreement with this. We also discussed hospice, including residential and home hospice options. They will discuss this further with family, but based on [name]'s care needs they are leaning towards residential.    Contacts/Participants in Discussion: Primary Decision Maker: April [name]   HCPOA: yes    Code Status/Advance Care Planning:  DNR  Symptom Management:   Dilaudid 0.5-1 mg q 2 hours prn pain  Ativan 0.5 mg q 4 hours prn anxiety   Continue current anti-emetics   Additional Recommendations (Limitations, Scope, Preferences):  [name] has elected to switch to comfort care. We have changed orders to reflect this. Will also liberate visitor policy to up to 4.  Psycho-social/Spiritual:   Support System: daughter and husband at bedside   Prognosis: < 2 weeks based on infection severity and PO intake.   Discharge Planning:  To Be Determined, family considering home with hospice vs residential hospice.          Primary Diagnoses  Present on Admission: . Hypotension . Acute on chronic diastolic CHF (congestive heart failure) ([place]) . Acute renal failure superimposed on stage 3b chronic kidney disease ([place]) . Acute respiratory failure with hypoxia ([place]) . Depression . Chronic obstructive pulmonary disease ([place]) 1.   Palliative Review of Systems: She is currently having pain, shortness of breath, and nausea with dry heaving.   I have reviewed the medical record, interviewed the patient and family, and examined the patient. The following aspects are pertinent.  Past Medical  History:  Diagnosis Date  . Arthritis   . Asthma   . CHF (congestive heart failure) ([place])   . COPD (chronic obstructive pulmonary disease) ([place])   . Depression   . Diabetes mellitus type 2, uncomplicated ([place]) 11/28/1094   . Essential hypertension 02/08/2020  . GERD (gastroesophageal reflux disease)   . Hyperlipidemia   . Hyperparathyroidism   . Polycystic kidney   . PONV (postoperative nausea and vomiting)    Social History   Socioeconomic History  . Marital status: Married    Spouse name: Not on file  . Number of children: Not on file  . Years of education: Not on file  . Highest education level: Not on file  Occupational History  . Occupation: retired  Tobacco Use  . Smoking status: Former Smoker    Packs/day: 0.25    Years: 15.00    Pack years: 3.75    Types: Cigarettes    Quit date: 03/22/1998    Years since quitting: 22.8  . Smokeless tobacco: Never Used  Vaping Use  . Vaping Use: Never used  Substance and Sexual Activity  . Alcohol use: No  . Drug use: No  . Sexual activity: Not on file  Other Topics Concern  . Not on file  Social History Narrative   Married, lives with spouse, son and mother   2 children > son and daughter   OCCUPATION: retired Education officer, museum   Social Determinants of Radio broadcast assistant Strain: Not on [employer] Insecurity: Not on file  Transportation Needs: Not on file  Physical Activity: Not on file  Stress: Not on file  Social Connections: Not on file   Family History  Problem Relation Age of Onset  . Hypertension Mother   . Heart disease Father        CABG history  . Polycystic kidney disease Father   . Polycystic kidney disease Brother    Scheduled Meds: . (feeding supplement) PROSource Plus  30 mL Oral BID BM  . allopurinol  100 mg Oral Daily  . amoxicillin-clavulanate  1 tablet Oral BID  . apixaban  5 mg Oral BID  . brimonidine  1 drop Both Eyes BID  . chlorhexidine  15 mL Mouth Rinse BID  . cycloSPORINE  1 drop Both Eyes BID  . dorzolamide  1 drop Both Eyes Daily  . dronabinol  2.5 mg Oral BID AC  . feeding supplement  237 mL Oral BID BM  . FLUoxetine  40 mg Oral Daily  . hydrocortisone   Rectal BID  . insulin aspart  0-5 Units  Subcutaneous QHS  . insulin aspart  0-9 Units Subcutaneous TID WC  . latanoprost  1 drop Both Eyes QHS  . mouth rinse  15 mL Mouth Rinse q12n4p  . metoprolol tartrate  25 mg Oral BID  . multivitamin with minerals  1 tablet Oral Daily  . mycophenolate  180 mg Oral BID  . nutrition supplement (JUVEN)  1 packet Oral BID BM  . predniSONE  7.5 mg Oral Q breakfast  . sodium chloride flush  3 mL Intravenous Q12H  . tacrolimus  1 mg Oral BID  . vancomycin  125 mg Oral Q12H   Continuous Infusions: . sodium chloride 10 mL/hr at 01/28/21 0832  . sodium chloride Stopped (01/30/21 0105)  . promethazine (PHENERGAN) injection     PRN Meds:.sodium chloride, sodium chloride, acetaminophen **OR** acetaminophen, alum & mag hydroxide-simeth, HYDROcodone-acetaminophen, morphine injection, ondansetron **OR** ondansetron (ZOFRAN) IV, prochlorperazine,  promethazine (PHENERGAN) injection Medications Prior to Admission:  Prior to Admission medications   Medication Sig Start Date End Date Taking? Authorizing Provider  acetaminophen (TYLENOL) 325 MG tablet Take 2 tablets (650 mg total) by mouth every 6 (six) hours as needed for mild pain (or Fever >/= 101). 05/22/20  Yes Elgergawy, Silver Huguenin, MD  allopurinol (ZYLOPRIM) 100 MG tablet Take 100 mg by mouth daily. 11/29/20  Yes [provider]  amLODipine (NORVASC) 5 MG tablet Take 1 tablet (5 mg total) by mouth daily. 01/15/21  Yes Mercy Riding, MD  apixaban (ELIQUIS) 5 MG TABS tablet Take 1 tablet (5 mg total) by mouth 2 (two) times daily. 01/14/21  Yes Mercy Riding, MD  brimonidine (ALPHAGAN P) 0.1 % SOLN Place 1 drop into both eyes 2 (two) times daily.    Yes [provider]  Budeson-Glycopyrrol-Formoterol (BREZTRI AEROSPHERE) 160-9-4.8 MCG/ACT AERO Inhale 2 puffs into the lungs 2 (two) times daily. 08/20/20  Yes Martyn Ehrich, NP  cholecalciferol (VITAMIN D3) 25 MCG (1000 UNIT) tablet Take 1 tablet (1,000 Units total) by mouth daily. 01/04/20  Yes  Angiulli, Lavon Paganini, PA-C  colchicine 0.6 MG tablet Take 0.5 tablets (0.3 mg total) by mouth daily. 01/15/21  Yes Mercy Riding, MD  cyanocobalamin 1000 MCG tablet Take 1,000 mcg by mouth daily.   Yes [provider]  cycloSPORINE (RESTASIS) 0.05 % ophthalmic emulsion Place 1 drop into both eyes 2 (two) times daily. 01/04/20  Yes Angiulli, Lavon Paganini, PA-C  dorzolamide (TRUSOPT) 2 % ophthalmic solution Place 1 drop into both eyes daily. 06/25/19  Yes [provider]  ferrous sulfate 325 (65 FE) MG tablet Take 325 mg by mouth daily with breakfast.   Yes [provider]  FLUoxetine (PROZAC) 40 MG capsule Take 1 capsule (40 mg total) by mouth daily. 01/04/20  Yes Angiulli, Lavon Paganini, PA-C  furosemide (LASIX) 40 MG tablet Take 1 tablet (40 mg total) by mouth 2 (two) times daily. 01/14/21  Yes Mercy Riding, MD  gabapentin (NEURONTIN) 100 MG capsule Take 200 mg by mouth 2 (two) times daily.   Yes [provider]  Glucerna (GLUCERNA) LIQD Take 237 mLs by mouth 2 (two) times daily between meals.   Yes [provider]  guaiFENesin (MUCINEX) 600 MG 12 hr tablet Take 600 mg by mouth 2 (two) times daily.   Yes [provider]  HYDROcodone-acetaminophen (NORCO/VICODIN) 5-325 MG tablet Take 1 tablet by mouth every 12 (twelve) hours as needed for moderate pain.   Yes [provider]  insulin aspart (NOVOLOG) 100 UNIT/ML injection Inject 0-9 Units into the skin 3 (three) times daily with meals. CBG 70 - 120: 0 units CBG 121 - 150: 1 unit CBG 151 - 200: 2 units CBG 201 - 250: 3 units CBG 251 - 300: 5 units CBG 301 - 350: 7 units CBG 351 - 400: 9 units 01/14/21  Yes Gonfa, Taye T, MD  insulin glargine (LANTUS) 100 UNIT/ML injection Inject 0.05 mLs (5 Units total) into the skin 2 (two) times daily. 01/14/21  Yes Mercy Riding, MD  metoprolol tartrate (LOPRESSOR) 25 MG tablet Take 1 tablet (25 mg total) by mouth 2 (two) times daily. 01/14/21  Yes Mercy Riding, MD   mycophenolate (MYFORTIC) 180 MG EC tablet Take 180 mg by mouth 2 (two) times daily.   Yes [provider]  Nutritional Supplements (,FEEDING SUPPLEMENT, PROSOURCE PLUS) liquid Take 30 mLs by mouth 2 (two) times daily  between meals. 01/14/21  Yes Mercy Riding, MD  ondansetron (ZOFRAN) 4 MG tablet Take 1 tablet (4 mg total) by mouth every 8 (eight) hours as needed for nausea or vomiting. 01/14/21 01/14/22 Yes Mercy Riding, MD  pantoprazole (PROTONIX) 40 MG tablet Take 1 tablet (40 mg total) by mouth daily. 01/15/21  Yes Mercy Riding, MD  predniSONE (DELTASONE) 2.5 MG tablet Take 3 tablets (7.5 mg total) by mouth daily with breakfast. 01/15/21  Yes Gonfa, Taye T, MD  PROGRAF 1 MG capsule Take 1 mg by mouth 2 (two) times daily.  03/22/19  Yes [provider]  sodium hypochlorite (DAKIN'S 1/4 STRENGTH) 0.125 % SOLN Irrigate with as directed 2 (two) times daily. 01/14/21  Yes Gonfa, Charlesetta Ivory, MD  TRAVATAN Z 0.004 % SOLN ophthalmic solution Place 1 drop into both eyes at bedtime. 07/03/16  Yes [provider]  valACYclovir (VALTREX) 500 MG tablet Take 500 mg by mouth 2 (two) times daily.   Yes [provider]  albuterol (VENTOLIN HFA) 108 (90 Base) MCG/ACT inhaler Inhale 2 puffs into the lungs every 4 (four) hours as needed for wheezing or shortness of breath. 01/04/20   Angiulli, Lavon Paganini, PA-C  ipratropium-albuterol (DUONEB) 0.5-2.5 (3) MG/3ML SOLN Take 3 mLs by nebulization every 6 (six) hours as needed (sob/wheezing).  08/24/20   [provider]  promethazine (PHENERGAN) 25 MG tablet Take 1 tablet (25 mg total) by mouth every 8 (eight) hours as needed for up to 15 doses for nausea or vomiting. 09/02/20   Curatolo, Adam, DO   Allergies  Allergen Reactions  . Infed [Iron Dextran] Other (See Comments)    Chest tightness  . Pentamidine Itching, Shortness Of Breath and Swelling  . Budesonide-Formoterol Fumarate     Other reaction(s): hoarseness  . Bupropion      Other reaction(s): exacerbated depression  . Crestor [Rosuvastatin]     Other reaction(s): body aches, swelling  . Duloxetine Hcl     Other reaction(s): GI SE, weepy, worsening fatigue, foggy  . Erythromycin [Erythromycin] Other (See Comments)    Mouth Ulcers  . Iohexol Other (See Comments)    Per patient "she has had a kidney transplant and should never have contrast"  . Oxycodone Nausea Only and Nausea And Vomiting  . Pravastatin Sodium     Other reaction(s): myalgias  . Erythromycin Rash    Causes breakout in mouth  . Ultram [Tramadol Hcl] Anxiety   CBC:    Component Value Date/Time   WBC 7.6 02/04/2021 0545   HGB 9.8 (L) 02/04/2021 0545   HGB 9.0 (L) 11/04/2020 0952   HGB 10.6 (L) 12/29/2010 1522   HCT 34.3 (L) 02/04/2021 0545   HCT 31.2 (L) 12/29/2010 1522   PLT 72 (L) 02/04/2021 0545   PLT 241 11/04/2020 0952   PLT 205 12/29/2010 1522   MCV 112.8 (H) 02/04/2021 0545   MCV 94 12/29/2010 1522   NEUTROABS 3.5 01/22/2021 0258   NEUTROABS 3.9 12/29/2010 1522   LYMPHSABS 0.4 (L) 01/22/2021 0258   LYMPHSABS 1.1 12/29/2010 1522   MONOABS 0.1 01/22/2021 0258   EOSABS 0.0 01/22/2021 0258   EOSABS 0.2 12/29/2010 1522   BASOSABS 0.0 01/22/2021 0258   BASOSABS 0.0 12/29/2010 1522   Comprehensive Metabolic Panel:    Component Value Date/Time   NA 140 02/04/2021 0545   K 3.5 02/04/2021 0545   CL 111 02/04/2021 0545   CO2 19 (L) 02/04/2021 0545   BUN 48 (H) 02/04/2021 0545  CREATININE 1.95 (H) 02/04/2021 0545   CREATININE 1.28 (H) 11/04/2020 0952   GLUCOSE 120 (H) 02/04/2021 0545   CALCIUM 8.9 02/04/2021 0545   CALCIUM 10.8 (H) 10/13/2011 1405   AST 20 01/22/2021 0258   AST 13 (L) 11/04/2020 0952   ALT 9 01/22/2021 0258   ALT 12 11/04/2020 0952   ALKPHOS 80 01/22/2021 0258   BILITOT 1.1 01/22/2021 0258   BILITOT 0.6 11/04/2020 0952   PROT 5.7 (L) 01/22/2021 0258   ALBUMIN 2.1 (L) 01/22/2021 0258    Physical Exam: Vital Signs: BP 140/90 (BP Location: Left Leg)    Pulse 90   Temp (!) 97.5 F (36.4 C) (Axillary)   Resp 14   Ht 5\' 1"  (1.549 m)   Wt 72.2 kg   SpO2 98%   BMI 30.08 kg/m  SpO2: SpO2: 98 % O2 Device: O2 Device: Nasal Cannula O2 Flow Rate: O2 Flow Rate (L/min): 4 L/min Intake/output summary:   Intake/Output Summary (Last 24 hours) at 02/05/2021 1515 Last data filed at 02/04/2021 1816 Gross per 24 hour  Intake -  Output 150 ml  Net -150 ml   LBM: Last BM Date: 02/05/21 Baseline Weight: Weight: 71.4 kg Most recent weight: Weight: 72.2 kg  Exam Findings:  General: ill-appearing, in mild distress Pulm: diaphragmatic breathing, tachypneic  Skin: mottled  Neuro: drowsy, answers questions with one word responses          Palliative Performance Scale: 20%           Additional Data Reviewed: Recent Labs    02/03/21 0512 02/04/21 0545  WBC 4.8 7.6  HGB 8.5* 9.8*  PLT 80* 72*  NA 142 140  BUN 53* 48*  CREATININE 2.00* 1.95*     Time In: 1500 Time Out: 1540 Time Total: 40 min.  Greater than 50%  of this time was spent counseling and coordinating care related to the above assessment and plan.  Modena Nunnery D, DO  02/05/2021, 3:15 PM  Please contact Palliative Medicine Team phone at [phone] for questions and concerns.   PMT Attending Addendum: Patient seen and examined, discussed with patient and daughter, rest as above. Patient states, "I'm ready to die." Patient given the space and opportunity to reflect on her current and past illnesses and her overall health. Patient states that the burden of her suffering is great, she wishes to focus on comfort and end of life care.  Discussed with daughter at bedside who concurs with patient's wishes.  Med list revised Seek chaplain assistance Liberalize visitor status PMT to follow.  Loistine Chance MD [facility] [facility].

## 2021-02-06 DIAGNOSIS — G9341 Metabolic encephalopathy: Secondary | ICD-10-CM | POA: Diagnosis not present

## 2021-02-06 DIAGNOSIS — Z515 Encounter for palliative care: Secondary | ICD-10-CM | POA: Diagnosis not present

## 2021-02-06 DIAGNOSIS — Z7189 Other specified counseling: Secondary | ICD-10-CM | POA: Diagnosis not present

## 2021-02-06 NOTE — Progress Notes (Signed)
   Daily Progress Note   Patient Name: April [name]       Date: 02/06/2021 DOB: 12/25/1955  Age: 65 y.o. MRN#: [id number] Attending Physician: Antonieta Pert, MD Primary Care Physician: [name], MD Admit Date: 01/14/2021  Reason for Consultation/Follow-up: Establishing goals of care  Subjective: Patient is resting in bed.  At present does not appear to have nonverbal gestures of distress or discomfort, daughter [name] present at bedside.  We reviewed about scope of comfort measures.  More family coming in to visit this afternoon.  Patient's daughter talks about balancing adequate symptom management with adequate awakeness and alertness so that the patient can reasonably interact with friends/family that is coming in to visit.  We discussed about end-of-life signs and symptoms.   Palliative performance scale 20%  Length of Stay: 17 days  Vital Signs: BP 133/78 (BP Location: Right Arm)   Pulse (!) 112   Temp 98.2 F (36.8 C) (Oral)   Resp 16   Ht 5\' 1"  (1.549 m)   Wt (!) 147.6 kg   SpO2 98%   BMI 61.48 kg/m  SpO2: SpO2: 98 % O2 Device: O2 Device: Nasal Cannula O2 Flow Rate: O2 Flow Rate (L/min): 4 L/min  Physical Exam: Appears chronically ill Appears with generalized weakness Shallow regular respirations Has some increasing peripheral edema S1-S2            Palliative Care Assessment & Plan    Code Status:  DNR  Goals of Care/Recommendations:  DNR/DNI  Now on comfort measures Anticipated hospital death, prognosis likely limited to few days, continue to support the patient and family.    Prognosis: few days  Discharge Planning: anticipated hospital death.   Thank you for allowing the Palliative Medicine Team to assist in the care of this patient.  Time Total: 25 min.   Greater than 50%  of this time was spent counseling and coordinating care related to the above assessment and plan.  Loistine Chance, MD [place] Team [phone]

## 2021-02-06 NOTE — Progress Notes (Signed)
Referring Physician(s): Earnstine Regal ([place])  Supervising Physician: Jacqulynn Cadet  Patient Status:  [facility] - In-pt  Chief Complaint: LLQ drain follow-up  Subjective:  History of perforated diverticulitis s/p ex lap with sigmoid colectomy and end colostomy in OR 2/0/9470; complicated by development of intra-abdominal fluid collection concerning for abscess s/p LLQ drain placement in IR 01/24/2021. Patient laying in bed resting comfortably. Appears somnolent, does not open eyes to voice or painful stimuli. Two family members at bedside. LLQ drain site c/d/i.   Allergies: Infed [iron dextran], Pentamidine, Budesonide-formoterol fumarate, Bupropion, Crestor [rosuvastatin], Duloxetine hcl, Erythromycin [erythromycin], Iohexol, Oxycodone, Pravastatin sodium, Erythromycin, and Ultram [tramadol hcl]  Medications: Prior to Admission medications   Medication Sig Start Date End Date Taking? Authorizing Provider  acetaminophen (TYLENOL) 325 MG tablet Take 2 tablets (650 mg total) by mouth every 6 (six) hours as needed for mild pain (or Fever >/= 101). 05/22/20  Yes Elgergawy, Silver Huguenin, MD  allopurinol (ZYLOPRIM) 100 MG tablet Take 100 mg by mouth daily. 11/29/20  Yes [provider]  amLODipine (NORVASC) 5 MG tablet Take 1 tablet (5 mg total) by mouth daily. 01/15/21  Yes Mercy Riding, MD  apixaban (ELIQUIS) 5 MG TABS tablet Take 1 tablet (5 mg total) by mouth 2 (two) times daily. 01/14/21  Yes Mercy Riding, MD  brimonidine (ALPHAGAN P) 0.1 % SOLN Place 1 drop into both eyes 2 (two) times daily.    Yes [provider]  Budeson-Glycopyrrol-Formoterol (BREZTRI AEROSPHERE) 160-9-4.8 MCG/ACT AERO Inhale 2 puffs into the lungs 2 (two) times daily. 08/20/20  Yes Martyn Ehrich, NP  cholecalciferol (VITAMIN D3) 25 MCG (1000 UNIT) tablet Take 1 tablet (1,000 Units total) by mouth daily. 01/04/20  Yes Angiulli, Lavon Paganini, PA-C  colchicine 0.6 MG tablet Take 0.5 tablets (0.3 mg total)  by mouth daily. 01/15/21  Yes Mercy Riding, MD  cyanocobalamin 1000 MCG tablet Take 1,000 mcg by mouth daily.   Yes [provider]  cycloSPORINE (RESTASIS) 0.05 % ophthalmic emulsion Place 1 drop into both eyes 2 (two) times daily. 01/04/20  Yes Angiulli, Lavon Paganini, PA-C  dorzolamide (TRUSOPT) 2 % ophthalmic solution Place 1 drop into both eyes daily. 06/25/19  Yes [provider]  ferrous sulfate 325 (65 FE) MG tablet Take 325 mg by mouth daily with breakfast.   Yes [provider]  FLUoxetine (PROZAC) 40 MG capsule Take 1 capsule (40 mg total) by mouth daily. 01/04/20  Yes Angiulli, Lavon Paganini, PA-C  furosemide (LASIX) 40 MG tablet Take 1 tablet (40 mg total) by mouth 2 (two) times daily. 01/14/21  Yes Mercy Riding, MD  gabapentin (NEURONTIN) 100 MG capsule Take 200 mg by mouth 2 (two) times daily.   Yes [provider]  Glucerna (GLUCERNA) LIQD Take 237 mLs by mouth 2 (two) times daily between meals.   Yes [provider]  guaiFENesin (MUCINEX) 600 MG 12 hr tablet Take 600 mg by mouth 2 (two) times daily.   Yes [provider]  HYDROcodone-acetaminophen (NORCO/VICODIN) 5-325 MG tablet Take 1 tablet by mouth every 12 (twelve) hours as needed for moderate pain.   Yes [provider]  insulin aspart (NOVOLOG) 100 UNIT/ML injection Inject 0-9 Units into the skin 3 (three) times daily with meals. CBG 70 - 120: 0 units CBG 121 - 150: 1 unit CBG 151 - 200: 2 units CBG 201 - 250: 3 units CBG 251 - 300: 5 units CBG 301 - 350: 7 units  CBG 351 - 400: 9 units 01/14/21  Yes Gonfa, Taye T, MD  insulin glargine (LANTUS) 100 UNIT/ML injection Inject 0.05 mLs (5 Units total) into the skin 2 (two) times daily. 01/14/21  Yes Mercy Riding, MD  metoprolol tartrate (LOPRESSOR) 25 MG tablet Take 1 tablet (25 mg total) by mouth 2 (two) times daily. 01/14/21  Yes Mercy Riding, MD  mycophenolate (MYFORTIC) 180 MG EC tablet Take 180 mg by mouth 2 (two) times daily.    Yes [provider]  Nutritional Supplements (,FEEDING SUPPLEMENT, PROSOURCE PLUS) liquid Take 30 mLs by mouth 2 (two) times daily between meals. 01/14/21  Yes Mercy Riding, MD  ondansetron (ZOFRAN) 4 MG tablet Take 1 tablet (4 mg total) by mouth every 8 (eight) hours as needed for nausea or vomiting. 01/14/21 01/14/22 Yes Mercy Riding, MD  pantoprazole (PROTONIX) 40 MG tablet Take 1 tablet (40 mg total) by mouth daily. 01/15/21  Yes Mercy Riding, MD  predniSONE (DELTASONE) 2.5 MG tablet Take 3 tablets (7.5 mg total) by mouth daily with breakfast. 01/15/21  Yes Gonfa, Taye T, MD  PROGRAF 1 MG capsule Take 1 mg by mouth 2 (two) times daily.  03/22/19  Yes [provider]  sodium hypochlorite (DAKIN'S 1/4 STRENGTH) 0.125 % SOLN Irrigate with as directed 2 (two) times daily. 01/14/21  Yes Gonfa, Charlesetta Ivory, MD  TRAVATAN Z 0.004 % SOLN ophthalmic solution Place 1 drop into both eyes at bedtime. 07/03/16  Yes [provider]  valACYclovir (VALTREX) 500 MG tablet Take 500 mg by mouth 2 (two) times daily.   Yes [provider]  albuterol (VENTOLIN HFA) 108 (90 Base) MCG/ACT inhaler Inhale 2 puffs into the lungs every 4 (four) hours as needed for wheezing or shortness of breath. 01/04/20   Angiulli, Lavon Paganini, PA-C  ipratropium-albuterol (DUONEB) 0.5-2.5 (3) MG/3ML SOLN Take 3 mLs by nebulization every 6 (six) hours as needed (sob/wheezing).  08/24/20   [provider]  promethazine (PHENERGAN) 25 MG tablet Take 1 tablet (25 mg total) by mouth every 8 (eight) hours as needed for up to 15 doses for nausea or vomiting. 09/02/20   Curatolo, Adam, DO     Vital Signs: BP 133/78 (BP Location: Right Arm)   Pulse (!) 112   Temp 98.2 F (36.8 C) (Oral)   Resp 16   Ht 5\' 1"  (1.549 m)   Wt (!) 325 lb 6.4 oz (147.6 kg)   SpO2 98%   BMI 61.48 kg/m   Physical Exam Constitutional:      General: She is not in acute distress.    Comments: Somnolent.  Pulmonary:     Effort:  Pulmonary effort is normal. No respiratory distress.  Abdominal:     Comments: (+) colostomy. LLQ drain without erythema, drainage, or active bleeding; approximately 25 cc feculent output in suction bulb.  Skin:    General: Skin is warm and dry.  Neurological:     Comments: Somnolent- does not open eyes to voice or painful stimuli.     Imaging: IR Sinus/Fist Tube Chk-Non GI  Result Date: 02/03/2021 CLINICAL DATA:  65 year old female with history of diverticular abscess and left lower quadrant pigtail drainage catheter placement on 01/24/2021. EXAM: SINUS TRACT INJECTION/FISTULOGRAM COMPARISON:  01/24/2021 CONTRAST:  10 mL Omnipaque 300-administered via the existing percutaneous drain. FLUOROSCOPY TIME:  42 seconds, 5 mGy TECHNIQUE: The patient was positioned supine on the fluoroscopy table. A preprocedural spot fluoroscopic image was obtained of the left lower  quadrant and the existing percutaneous drainage catheter. Multiple spot fluoroscopic and radiographic images were obtained following the injection of a small amount of contrast via the existing percutaneous drainage catheter. FINDINGS: Free flow of contrast material into the left pericolic gutter as well as tracking medially and inferiorly in the pelvis. Delayed images demonstrate opacification of the sigmoid colon and rectum. 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 PLAN: Keep drainage catheter to bulb suction. IR will continue to follow while inpatient. Plan to follow up with repeat drain injection in 1 month. Ruthann Cancer, MD [facility] Electronically Signed   By: Ruthann Cancer MD   On: 02/03/2021 12:05    Labs:  CBC: Recent Labs    02/01/21 0546 02/02/21 0825 02/03/21 0512 02/04/21 0545  WBC 7.4 7.0 4.8 7.6  HGB 9.3* 9.1* 8.5* 9.8*  HCT 31.1* 31.5* 29.4* 34.3*  PLT 93* 90* 80* 72*    COAGS: Recent  Labs    01/21/21 0239 01/21/21 1000 01/22/21 0258 01/23/21 0248 01/23/21 1304 01/24/21 0252 01/25/21 0523 01/25/21 1458 01/26/21 0145  INR 3.1*  --  2.2* 1.6*  --  1.9*  --   --   --   APTT 133*   < > 68* 57*   < > 61* 56* 56* 105*   < > = values in this interval not displayed.    BMP: Recent Labs    07/01/20 2334 07/02/20 0030 07/03/20 0350 07/18/20 1545 08/05/20 1523 09/02/20 2049 02/01/21 0546 02/02/21 0825 02/03/21 0512 02/04/21 0545  NA 135   < > 139 131* 140   < > 139 140 142 140  K 4.4   < > 5.1 4.7 4.5   < > 3.8 3.3* 3.5 3.5  CL 102   < > 108 95* 106   < > 107 112* 113* 111  CO2 25  --  20* 24 27   < > 20* 18* 17* 19*  GLUCOSE 135*   < > 187* 187* 189*   < > 77 86 110* 120*  BUN 28*   < > 42* 30* 45*   < > 59* 55* 53* 48*  CALCIUM 9.7  --  9.0 9.4 10.1   < > 8.5* 8.7* 8.7* 8.9  CREATININE 1.02*   < > 1.17* 1.39* 1.30*   < > 2.15* 2.14* 2.00* 1.95*  GFRNONAA 58*  --  50* 40* 44*   < > 25* 25* 28* 28*  GFRAA >60  --  57* 47* 51*  --   --   --   --   --    < > = values in this interval not displayed.    LIVER FUNCTION TESTS: Recent Labs    01/03/21 0300 01/04/21 0556 01/08/21 1021 01/09/21 0511 01/12/21 0250 01/13/21 0049 01/05/2021 1339 01/22/21 0258  BILITOT 1.4*  --  1.5*  --   --   --  1.2 1.1  AST 11*  --  10*  --   --   --  25 20  ALT 11  --  8  --   --   --  8 9  ALKPHOS 53  --  62  --   --   --  94 80  PROT 5.6*  --  5.4*  --   --   --  6.0* 5.7*  ALBUMIN 2.3*   < > 2.3*   < > 1.9* 2.0* 1.9* 2.1*   < > =  values in this interval not displayed.    Assessment and Plan:  History of perforated diverticulitis s/p ex lap with sigmoid colectomy and end colostomy in OR 06/23/1030; complicated by development of intra-abdominal fluid collection concerning for abscess s/p LLQ drain placement in IR 01/24/2021. LLQ drain stable with approximately 25 cc feculent output in suction bulb. 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 Continue current drain management- continue with Qshift monitor of output. Do NOT flush drain at this time as patient has fistula. Further plans per [facility]- appreciate and agree with management. IR to follow.   Electronically Signed: Earley Abide, PA-C 02/06/2021, 10:57 AM   I spent a total of 15 Minutes at the the patient's bedside AND on the patient's hospital floor or unit, greater than 50% of which was counseling/coordinating care for intra-abdominal fluid collection s/p LLQ drain placement.

## 2021-02-06 NOTE — Progress Notes (Signed)
PROGRESS NOTE    April [name]  GYK:[id number] DOB: [birth year] DOA: 01/15/2021 PCP: [name], MD   Chief Complaint  Patient presents with  . Hypotension  . hypoxia   Brief Narrative: 65 year old female with medical history of A flutter status post, chronic respiratory failure on 2 L oxygen, asthma, OSA, CKD stage IIIa, ESRD post transplant, diastolic heart failure, diabetes presented secondary to acute encephalopathy with associated hypotension in setting of intraabdominal fluid collection and recent intraabdominal surgery. General surgery and IR was consulted for management. Patient managed empirically on antibiotics. Found to have C. Difficile and started on Vancomycin PO.  Status post IR drain placement 3/4-being managed by IR, general surgery.  Patient has colostomy and lower abdominal wall wound and managed by dressing changes. Patient remains very weak deconditioned with poor oral intake.  She is at risk of decompensation.  Palliative care on board,she is DNR. Patient continued to worsen has not been eating or drinking deconditioned frail essentially bedbound. Patient wanted to stop the treatment and with family at the bedside decided on transition to comfort measures 3/16  Subjective: Seen and examined this morning. patient family including son is at the bedside-  Family wanting patient to be here in hospital on comfort measure. Patient appears calm comfortable sleeping. At times erratic breathing.  Assessment & Plan: Postop intra-abdominal fluid collection with abscess needing IR drain 3/4 after recent perforated diverticulitis for which she had exploratory laparoscopy and colostomy on 12/29/2020 Dr Marciano Sequin Repeat CT abdomen 3/11 interval resolution of the left lower quadrant collection.  Has a drain in place.  ID had switched to oral Augmentin and oral vancomycin.  At this time patient is not taking anything by mouth  Goals of care/end-of-life care:DNR.Palliative care  following.  Given complex comorbidities, prognosis appears guarded, at risk of decompensation.  Palliative meeting with family 3/16, patient elected to switch to comfort care and medication has been adjusted by palliative care. Daughter/family in agreement with patient;s decision.  Failure to thrive with deconditioning/Poor appetite/very poor intake: With ongoing poor oral intake deconditioning failure to thrive.  No artificial tube feeding per patient.Daughter reports ever since her extubation she has not perked up.  After family meeting with palliative care was transitioned to comfort measures  C. difficile colitis positive antigen/toxin 3/1 was being treated with vancomycin, colostomy with stool output  Acute on chronic diastolic CHF/Anasarca:status post Lasix and stopped due to AKI. Net IO Since Admission: -5,601.79 mL [02/06/21 1305]  Acute on chronic respiratory failure with hypoxia: Continue nasal cannula oxygen, continue supportive measures for comfort  Acute metabolic encephalopathy: Lethargic frail.    AKI on CKD stage IIIa/Metabolic acodisis- baseline creatinine variable 0.9-1.8 in feb: ESRD hx s/p renal transplant 2013 on immunosuppressant and chronic steroid/PCKD Creatinine ~1.9. BUN 62>48.Off lasix. At risk for AKI with poor oral intake.  She has been on Prograf, mycophenolate,and prednisone.Tacrolimus level 6.0 on 12/27/20. Recent Labs  Lab 01/31/21 0555 02/01/21 0546 02/02/21 0825 02/03/21 0512 02/04/21 0545  BUN 62* 59* 55* 53* 48*  CREATININE 2.06* 2.15* 2.14* 2.00* 1.95*   History of adrenal insufficiency was on prednisone 7.5 mg.  Hypotension:BP is controlled.  Acute urine retention: now Voiding,purewick in place.  Thrombocytopenia: In the setting of acute illness,?Etiology,platelet was at 84. No more labs Recent Labs  Lab 01/31/21 0555 02/01/21 0546 02/02/21 0825 02/03/21 0512 02/04/21 0545  PLT 110* 93* 90* 80* 72*   Acute on chronic anemia/Normocytic  anemia/Iron deficiency anemia Large hematoma Left arm drained and dressed  Anemia in the setting of recent abdominal surgery left arm hematoma.  Overall stable.  Recent Labs  Lab 01/31/21 0555 02/01/21 0546 02/02/21 0825 02/03/21 0512 02/04/21 0545  HGB 9.0* 9.3* 9.1* 8.5* 9.8*  HCT 30.9* 31.1* 31.5* 29.4* 34.3*   Typical atrial flutter, TEE/cardioversion 2/6 and in sinus and has been on Eliquis- was on IV heparin prior to transition.  T2DM: Blood sugar w/ a episode of hypoglycemia 65 on 3/9 am-CBG was low due to poor intake and Lantus was discontinued altogether 3/12. No more blood draws now Recent Labs  Lab 02/04/21 0732 02/04/21 1158 02/04/21 1702 02/04/21 2017 02/05/21 0730  GLUCAP 113* 162* 197* 742* 595*   Metabolic acidosis with low bicarb;Resolved. Depression Raynad's Possible OSA Gout  Diet Order            DIET DYS 3 Room service appropriate? Yes with Assist; Fluid consistency: Thin  Diet effective now                 Nutrition Problem: Increased nutrient needs Etiology: acute illness,wound healing Signs/Symptoms: estimated needs Interventions: Juven,Magic cup,Ensure Enlive (each supplement provides 350kcal and 20 grams of protein),Prostat Patient's Body mass index is 61.48 kg/m.  DVT prophylaxis: Heprain iv Code Status:   Code Status: DNR Family Communication: plan of care discussed with patient's family at bedside.  Status is: Inpatient Remains inpatient appropriate because:IV treatments appropriate due to intensity of illness or inability to take PO and Inpatient level of care appropriate due to severity of illness  Dispo: The patient is from: Home              Anticipated d/c is to: TBD- hospice              Patient currently is not medically stable to d/c.   Difficult to place patient No  Unresulted Labs (From admission, onward)         None     Medications reviewed:  Scheduled Meds: . (feeding supplement) PROSource Plus  30 mL Oral BID  BM  . amoxicillin-clavulanate  1 tablet Oral BID  . brimonidine  1 drop Both Eyes BID  . chlorhexidine  15 mL Mouth Rinse BID  . cycloSPORINE  1 drop Both Eyes BID  . dorzolamide  1 drop Both Eyes Daily  . feeding supplement  237 mL Oral BID BM  . FLUoxetine  40 mg Oral Daily  . hydrocortisone   Rectal BID  . latanoprost  1 drop Both Eyes QHS  . mouth rinse  15 mL Mouth Rinse q12n4p  . metoprolol tartrate  25 mg Oral BID  . predniSONE  7.5 mg Oral Q breakfast  . sodium chloride flush  3 mL Intravenous Q12H   Continuous Infusions: . sodium chloride 10 mL/hr at 01/28/21 0832  . sodium chloride Stopped (01/30/21 0105)  . promethazine (PHENERGAN) injection      Consultants:see note  Procedures:see note  Antimicrobials: Anti-infectives (From admission, onward)   Start     Dose/Rate Route Frequency Ordered Stop   02/04/21 1000  amoxicillin-clavulanate (AUGMENTIN) 500-125 MG per tablet 500 mg        1 tablet Oral 2 times daily 02/04/21 0815 02/14/21 0959   02/03/21 2200  vancomycin (VANCOCIN) 50 mg/mL oral solution 125 mg  Status:  Discontinued        125 mg Oral Every 12 hours 02/03/21 1548 02/05/21 1517   01/21/21 2200  vancomycin (VANCOCIN) 50 mg/mL oral solution 125 mg  125 mg Oral 4 times daily 01/21/21 2028 01/31/21 2159   01/21/21 1800  ceFEPIme (MAXIPIME) 2 g in sodium chloride 0.9 % 100 mL IVPB  Status:  Discontinued        2 g 200 mL/hr over 30 Minutes Intravenous Every 24 hours 01/17/2021 1742 02/04/21 0815   01/21/21 0000  metroNIDAZOLE (FLAGYL) IVPB 500 mg  Status:  Discontinued        500 mg 100 mL/hr over 60 Minutes Intravenous Every 8 hours 01/13/2021 1821 02/04/21 0815   01/10/2021 1800  vancomycin (VANCOCIN) IVPB 1000 mg/200 mL premix  Status:  Discontinued        1,000 mg 200 mL/hr over 60 Minutes Intravenous Every 48 hours 01/03/2021 1742 01/22/21 1053   01/05/2021 1545  ceFEPIme (MAXIPIME) 2 g in sodium chloride 0.9 % 100 mL IVPB        2 g 200 mL/hr over 30  Minutes Intravenous  Once 12/31/2020 1544 01/01/2021 1625   01/14/2021 1545  metroNIDAZOLE (FLAGYL) IVPB 500 mg        500 mg 100 mL/hr over 60 Minutes Intravenous  Once 01/11/2021 1544 01/16/2021 1730   01/05/2021 1545  vancomycin (VANCOCIN) IVPB 1000 mg/200 mL premix  Status:  Discontinued        1,000 mg 200 mL/hr over 60 Minutes Intravenous  Once 12/27/2020 1544 01/16/2021 1847     Culture/Microbiology    Component Value Date/Time   SDES  01/24/2021 1308    ABSCESS Performed at [facility], [place] [address] [place], [place] [address]    SPECREQUEST  01/24/2021 1308    NONE Performed at [facility], [place] [address], [place], [place] [address]    CULT (A) 01/24/2021 1308    MULTIPLE ORGANISMS PRESENT, NONE PREDOMINANT NO ANAEROBES ISOLATED Performed at [facility], [place] [address], [place], [place] [address]    REPTSTATUS 01/30/2021 FINAL 01/24/2021 1308    Other culture-see note  Objective: Vitals: Today's Vitals   02/05/21 1920 02/05/21 2052 02/05/21 2100 02/06/21 0958  BP:  133/78    Pulse:  76  (!) 112  Resp:    16  Temp:  98.2 F (36.8 C)    TempSrc:  Oral    SpO2:  98%    Weight:  (!) 147.6 kg    Height:      PainSc: 5   Asleep Asleep    Intake/Output Summary (Last 24 hours) at 02/06/2021 1305 Last data filed at 02/06/2021 0740 Gross per 24 hour  Intake 20 ml  Output --  Net 20 ml   Filed Weights   01/30/21 0433 01/31/21 0624 02/05/21 2052  Weight: 72 kg 72.2 kg (!) 147.6 kg   Weight change:   Intake/Output from previous day: No intake/output data recorded. Intake/Output this shift: Total I/O In: 20 [I.V.:20] Out: -  Filed Weights   01/30/21 0433 01/31/21 0624 02/05/21 2052  Weight: 72 kg 72.2 kg (!) 147.6 kg    Examination: General exam: Sleeping,not in distress, sick looking HEENT:Oral mucosa moist, Ear/Nose WNL grossly, dentition normal. Respiratory system: bilaterally diminishedd,no wheezing or crackles,no  use of accessory muscle Cardiovascular system: S1 & S2 +, No JVD,. Gastrointestinal system: Abdomen soft,jp drain +, colostomy +, wound with dressing in place. Nervous System: Sleepy/sedated.   Extremities: UE edema, distal peripheral pulses palpable.  Skin: No rashes,no icterus. MSK: thin muscle bulk,tone, power  Data Reviewed: I have personally reviewed following labs and imaging studies CBC: Recent Labs  Lab 01/31/21 0555 02/01/21 0546 02/02/21 0825 02/03/21 0512 02/04/21 0545  WBC 6.5 7.4 7.0 4.8 7.6  HGB 9.0* 9.3* 9.1* 8.5* 9.8*  HCT 30.9* 31.1* 31.5* 29.4* 34.3*  MCV 108.4* 107.6* 112.1* 112.2* 112.8*  PLT 110* 93* 90* 80* 72*   Basic Metabolic Panel: Recent Labs  Lab 01/31/21 0555 02/01/21 0546 02/02/21 0825 02/03/21 0512 02/04/21 0545  NA 140 139 140 142 140  K 3.3* 3.8 3.3* 3.5 3.5  CL 107 107 112* 113* 111  CO2 22 20* 18* 17* 19*  GLUCOSE 89 77 86 110* 120*  BUN 62* 59* 55* 53* 48*  CREATININE 2.06* 2.15* 2.14* 2.00* 1.95*  CALCIUM 8.6* 8.5* 8.7* 8.7* 8.9   GFR: Estimated Creatinine Clearance: 40.9 mL/min (A) (by C-G formula based on SCr of 1.95 mg/dL (H)). Liver Function Tests: No results for input(s): AST, ALT, ALKPHOS, BILITOT, PROT, ALBUMIN in the last 168 hours. No results for input(s): LIPASE, AMYLASE in the last 168 hours. No results for input(s): AMMONIA in the last 168 hours. Coagulation Profile: No results for input(s): INR, PROTIME in the last 168 hours. Cardiac Enzymes: No results for input(s): CKTOTAL, CKMB, CKMBINDEX, TROPONINI in the last 168 hours. BNP (last 3 results) No results for input(s): PROBNP in the last 8760 hours. HbA1C: No results for input(s): HGBA1C in the last 72 hours. CBG: Recent Labs  Lab 02/04/21 0732 02/04/21 1158 02/04/21 1702 02/04/21 2017 02/05/21 0730  GLUCAP 113* 162* 197* 190* 105*   Lipid Profile: No results for input(s): CHOL, HDL, LDLCALC, TRIG, CHOLHDL, LDLDIRECT in the last 72 hours. Thyroid  Function Tests: No results for input(s): TSH, T4TOTAL, FREET4, T3FREE, THYROIDAB in the last 72 hours. Anemia Panel: No results for input(s): VITAMINB12, FOLATE, FERRITIN, TIBC, IRON, RETICCTPCT in the last 72 hours. Sepsis Labs: No results for input(s): PROCALCITON, LATICACIDVEN in the last 168 hours.  No results found for this or any previous visit (from the past 240 hour(s)).   Radiology Studies: No results found.   LOS: 22 days   Antonieta Pert, MD [facility]  02/06/2021, 1:05 PM

## 2021-02-06 NOTE — Care Management Important Message (Signed)
Important Message  Patient Details IM Letter given to the Patient. Name: April [name] MRN: [id number] Date of Birth: 1956/03/16   Medicare Important Message Given:  Yes     Kerin Salen 02/06/2021, 10:13 AM

## 2021-02-06 NOTE — Progress Notes (Signed)
Chaplain engaged in a follow-up visit with April [name], her daughter April [name], and son April [name].  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  They stated [name] has always been very caring, sacrificial, and loving towards others.  April [name] recounted the ways in which her mom has done so much for them and held grief around not being able to repay her mom.  Chaplain also learned that [name] really loves the beach.  They played a scene of an ocean on their IPAD for her to watch.  They enjoyed yearly vacations at the beach with their mom.  [name] and her husband met in college and have been married about 64 years.    Chaplain worked to get to know [name] through her children.  Chaplain provided support as they grieved over losing their mom.    Chaplain is available to follow-up.    02/06/21 1200  Clinical Encounter Type  Visited With Patient and family together  Visit Type Follow-up;Patient actively dying;Spiritual support

## 2021-02-06 NOTE — Progress Notes (Signed)
Aware of decision to transition to comfort care.  Can continue dressing changes for comfort. Will defer on drain to stay vs remove for comfort.  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  We are available if needed, otherwise we will sign off at this time.  Henreitta Cea 7:40 AM 02/06/2021

## 2021-02-07 DIAGNOSIS — Z515 Encounter for palliative care: Secondary | ICD-10-CM | POA: Diagnosis not present

## 2021-02-07 DIAGNOSIS — Z7189 Other specified counseling: Secondary | ICD-10-CM | POA: Diagnosis not present

## 2021-02-07 DIAGNOSIS — R531 Weakness: Secondary | ICD-10-CM

## 2021-02-07 DIAGNOSIS — G9341 Metabolic encephalopathy: Secondary | ICD-10-CM | POA: Diagnosis not present

## 2021-02-07 MED ORDER — HYDROMORPHONE BOLUS VIA INFUSION
0.5000 mg | INTRAVENOUS | Status: DC | PRN
  Filled 2021-02-07: qty 1

## 2021-02-07 MED ORDER — SODIUM CHLORIDE 0.9 % IV SOLN
0.5000 mg/h | INTRAVENOUS | Status: DC
  Administered 2021-02-07: 0.5 mg/h via INTRAVENOUS
  Filled 2021-02-07: qty 5

## 2021-02-07 NOTE — Progress Notes (Signed)
   Daily Progress Note   Patient Name: April [name]       Date: 02/07/2021 DOB: Dec 17, 1955  Age: 65 y.o. MRN#: [id number] Attending Physician: Antonieta Pert, MD Primary Care Physician: [name], MD Admit Date: 01/01/2021  Reason for Consultation/Follow-up: Establishing goals of care  Subjective: Patient is resting in bed.  At present does not appear to have nonverbal gestures of distress or discomfort, son and another family member  present at bedside.  We reviewed about scope of comfort measures.  Family at bedside states that additional family came in to visit with the patient yesterday on 02-06-21.    We discussed again about end-of-life signs and symptoms.  Patient being awake and alert and corresponding with her visitors is very important to the patient's family.  Discussed gently but compassionately about balancing that with adequate pain and on pain symptom management with the patient being on comfort measures and having markedly limited prognosis.  Continue to support patient and family as a unit.  Discussed with bedside RN.  Palliative performance scale 20%  Length of Stay: 18 days  Vital Signs: BP 133/78 (BP Location: Right Arm)   Pulse (!) 112   Temp 98.2 F (36.8 C) (Oral)   Resp 16   Ht 5\' 1"  (1.549 m)   Wt (!) 147.6 kg   SpO2 98%   BMI 61.48 kg/m  SpO2: SpO2: 98 % O2 Device: O2 Device: Nasal Cannula O2 Flow Rate: O2 Flow Rate (L/min): 4 L/min  Physical Exam: Appears chronically ill Appears with generalized weakness Shallow regular respirations Has some increasing peripheral edema S1-S2            Palliative Care Assessment & Plan    Code Status:  DNR  Goals of Care/Recommendations:  DNR/DNI  Now on comfort measures Anticipated hospital death, prognosis likely limited to few days, continue to support the patient and family.    Prognosis: few days End-of-life signs and symptoms discussed with family at bedside.  Discussed about judicious use of  opioids, discontinue p.o. medications patient is not awake alert enough to safely swallow, discussed with family about patient likely needing continuous infusion if noted to be in distress or if noted to have required several as needed's within the next few hours.  Continue to monitor. Discharge Planning: anticipated hospital death.   Thank you for allowing the Palliative Medicine Team to assist in the care of this patient.  Time Total: 35 min.   Greater than 50%  of this time was spent counseling and coordinating care related to the above assessment and plan.  Loistine Chance, MD [place] Team [phone]

## 2021-02-07 NOTE — TOC Transition Note (Signed)
Transition of Care [facility]) - CM/SW Discharge Note   Patient Details  Name: YOANNA JURCZYK MRN: [id number] Date of Birth: [birth year]  Transition of Care [facility]) CM/SW Contact:  Lynnell Catalan, RN Phone Number: 02/07/2021, 1:10 PM   Clinical Narrative:    Per PMT plan now is for hospital death. TOC will support as needed.     Barriers to Discharge: Continued Medical Work up              Social Determinants of Health (SDOH) Interventions     Readmission Risk Interventions Readmission Risk Prevention Plan 05/15/2020 04/19/2020 02/09/2020  Transportation Screening Complete Complete Complete  PCP or Specialist Appt within 3-5 Days - - -  Not Complete comments - - -  HRI or [place] or Home Care Consult comments - - -  Social Work Consult for [place] consult not completed comments - - -  Palliative Care Screening - - -  Medication Review ([place]) Referral to Pharmacy Complete Referral to Pharmacy  PCP or Specialist appointment within 3-5 days of discharge Complete Complete Complete  HRI or Home Care Consult Complete Complete Complete  SW Recovery Care/Counseling Consult Complete Complete -  Palliative Care Screening Not Applicable Not Applicable Complete  Skilled Nursing Facility Complete Complete Patient Refused  Some recent data might be hidden

## 2021-02-07 NOTE — Progress Notes (Signed)
PROGRESS NOTE    April [name]  EVO:[id number] DOB: [birth year] DOA: 12/26/2020 PCP: [name], MD   Chief Complaint  Patient presents with  . Hypotension  . hypoxia   Brief Narrative: 65 year old female with medical history of A flutter status post, chronic respiratory failure on 2 L oxygen, asthma, OSA, CKD stage IIIa, ESRD post transplant, diastolic heart failure, diabetes presented secondary to acute encephalopathy with associated hypotension in setting of intraabdominal fluid collection and recent intraabdominal surgery. General surgery and IR was consulted for management. Patient managed empirically on antibiotics. Found to have C. Difficile and started on Vancomycin PO.  Status post IR drain placement 3/4-being managed by IR, general surgery.  Patient has colostomy and lower abdominal wall wound and managed by dressing changes. Patient remains very weak deconditioned with poor oral intake.  She is at risk of decompensation.  Palliative care on board,she is DNR. Patient continued to worsen has not been eating or drinking deconditioned frail essentially bedbound.  Patient completed a MOST form and does not desire to have feeding tube/aggressive measures etcs. Patient wanted to stop the treatment and with family at the bedside decided on transition to comfort measures 3/16.  Subjective: Seen and examined this morning. Husband and son at the bedside. Daughter had just left.   Patient has been anxious in pain earlier and currently resting comfortably after getting her PRN meds half an hour ago  Assessment & Plan: Postop intra-abdominal fluid collection with abscess needing IR drain 3/4 after recent perforated diverticulitis for which she had exploratory laparoscopy and colostomy on 12/29/2020 Dr Marciano Sequin CT abdomen 3/11 interval resolution of the left lower quadrant collection-has drain in place.  Antibiotic versus to Augmentin and vancomycin by ID. Now on comfort and not taking  po.  Goals of care/end-of-life care:DNR.Palliative care following.  Given complex comorbidities, failure to thrive with extremely poor oral intake-patient completed MOST form does not want any artificial tube feeding.  Further palliative care meeting held-patient elected to transition to comfort measures.  Continue as needed Dilaudid and Ativan and nausea medication to provide comfort.  Continue further plan of care end-of-life care as per palliative care team. Family not decided abt drip yet-defer to palliative, family waiting to d/w palliative today-daughter had just stepped this am arounf 9  to get some sleep and will be back.  Failure to thrive with deconditioning/Poor appetite/very poor intake: With ongoing poor oral intake deconditioning failure to thrive.  No artificial tube feeding per patient.Daughter reports ever since her extubation she has not perked up.  Now transition to comfort measures  C. difficile colitis positive antigen/toxin-was being treated with vancomycin,colostomy with stool output +  Acute on chronic diastolic CHF/Anasarca:status post Lasix and stopped due to AKI. Net IO Since Admission: -5,996.79 mL [02/07/21 1124]  Acute on chronic respiratory failure with hypoxia: Continue nasal cannula supplementation oxygen.   Acute metabolic encephalopathy: now on comfort  measures  AKI on CKD stage IIIa/Metabolic acodisis- baseline creatinine variable 0.9-1.8 in feb: ESRD hx s/p renal transplant 2013 on immunosuppressant and chronic steroid/PCKD Creatinine ~1.9. BUN 62>48.stop Lasix due to AKI and also poor oral intake at risk for AKI.She has been on Prograf, mycophenolate,and prednisone for her renal transplant in 2013.Tacrolimus level 6.0 on 12/27/20. Recent Labs  Lab 02/01/21 0546 02/02/21 0825 02/03/21 0512 02/04/21 0545  BUN 59* 55* 53* 48*  CREATININE 2.15* 2.14* 2.00* 1.95*   History of adrenal insufficiency was on prednisone 7.5 mg. Hypotension:BP stable Acute urine  retention: cont purewick  Thrombocytopenia: In the setting of acute illness,?Etiology,last platelet was at 72.  Recent Labs  Lab 02/01/21 0546 02/02/21 0825 02/03/21 0512 02/04/21 0545  PLT 93* 90* 80* 72*   Acute on chronic anemia/Normocytic anemia/Iron deficiency anemia/Large hematoma:Left arm drained and dressed: Anemia in the setting of recent abdominal surgery left arm hematoma.  Typical atrial flutter, TEE/cardioversion 2/6 and in sinus and has been on Eliquis- was on IV heparin prior to transition. Not taking po- cont comfort measures . T2DM: Blood sugar w/ a episode of hypoglycemia 65 on 3/9 am-CBG was low due to poor intake and Lantus was discontinued altogether 3/12. Recent Labs  Lab 02/04/21 0732 02/04/21 1158 02/04/21 1702 02/04/21 2017 02/05/21 0730  GLUCAP 113* 162* 197* 657* 846*   Metabolic acidosis with low bicarb;Resolved. Depression Raynad's Possible OSA Gout  Diet Order            DIET DYS 3 Room service appropriate? Yes with Assist; Fluid consistency: Thin  Diet effective now                 Nutrition Problem: Increased nutrient needs Etiology: acute illness,wound healing Signs/Symptoms: estimated needs Interventions: Juven,Magic cup,Ensure Enlive (each supplement provides 350kcal and 20 grams of protein),Prostat Patient's Body mass index is 61.48 kg/m.  DVT prophylaxis: Heprain iv Code Status:   Code Status: DNR Family Communication: plan of care discussed with patient's family at bedside.  Status is: Inpatient Remains inpatient appropriate because:IV treatments appropriate due to intensity of illness or inability to take PO and Inpatient level of care appropriate due to severity of illness  Dispo: The patient is from: Home              Anticipated d/c is to: TBD              Patient currently is not medically stable to d/c.   Difficult to place patient No  Unresulted Labs (From admission, onward)         None     Medications  reviewed:  Scheduled Meds: . (feeding supplement) PROSource Plus  30 mL Oral BID BM  . amoxicillin-clavulanate  1 tablet Oral BID  . brimonidine  1 drop Both Eyes BID  . chlorhexidine  15 mL Mouth Rinse BID  . cycloSPORINE  1 drop Both Eyes BID  . dorzolamide  1 drop Both Eyes Daily  . feeding supplement  237 mL Oral BID BM  . FLUoxetine  40 mg Oral Daily  . hydrocortisone   Rectal BID  . latanoprost  1 drop Both Eyes QHS  . mouth rinse  15 mL Mouth Rinse q12n4p  . metoprolol tartrate  25 mg Oral BID  . predniSONE  7.5 mg Oral Q breakfast  . sodium chloride flush  3 mL Intravenous Q12H   Continuous Infusions: . sodium chloride 10 mL/hr at 01/28/21 0832  . sodium chloride Stopped (01/30/21 0105)  . promethazine (PHENERGAN) injection      Consultants:see note  Procedures:see note  Antimicrobials: Anti-infectives (From admission, onward)   Start     Dose/Rate Route Frequency Ordered Stop   02/04/21 1000  amoxicillin-clavulanate (AUGMENTIN) 500-125 MG per tablet 500 mg        1 tablet Oral 2 times daily 02/04/21 0815 02/14/21 0959   02/03/21 2200  vancomycin (VANCOCIN) 50 mg/mL oral solution 125 mg  Status:  Discontinued        125 mg Oral Every 12 hours 02/03/21 1548 02/05/21 1517   01/21/21 2200  vancomycin (VANCOCIN)  50 mg/mL oral solution 125 mg        125 mg Oral 4 times daily 01/21/21 2028 01/31/21 2159   01/21/21 1800  ceFEPIme (MAXIPIME) 2 g in sodium chloride 0.9 % 100 mL IVPB  Status:  Discontinued        2 g 200 mL/hr over 30 Minutes Intravenous Every 24 hours 12/29/2020 1742 02/04/21 0815   01/21/21 0000  metroNIDAZOLE (FLAGYL) IVPB 500 mg  Status:  Discontinued        500 mg 100 mL/hr over 60 Minutes Intravenous Every 8 hours 01/17/2021 1821 02/04/21 0815   01/14/2021 1800  vancomycin (VANCOCIN) IVPB 1000 mg/200 mL premix  Status:  Discontinued        1,000 mg 200 mL/hr over 60 Minutes Intravenous Every 48 hours 12/27/2020 1742 01/22/21 1053   01/09/2021 1545  ceFEPIme  (MAXIPIME) 2 g in sodium chloride 0.9 % 100 mL IVPB        2 g 200 mL/hr over 30 Minutes Intravenous  Once 01/01/2021 1544 01/03/2021 1625   01/14/2021 1545  metroNIDAZOLE (FLAGYL) IVPB 500 mg        500 mg 100 mL/hr over 60 Minutes Intravenous  Once 12/31/2020 1544 01/03/2021 1730   01/18/2021 1545  vancomycin (VANCOCIN) IVPB 1000 mg/200 mL premix  Status:  Discontinued        1,000 mg 200 mL/hr over 60 Minutes Intravenous  Once 12/25/2020 1544 01/07/2021 1847     Culture/Microbiology    Component Value Date/Time   SDES  01/24/2021 1308    ABSCESS Performed at [facility], [place] [address] [place], [place] [address]    SPECREQUEST  01/24/2021 1308    NONE Performed at [facility], [place] [address] [place], [place] [address]    CULT (A) 01/24/2021 1308    MULTIPLE ORGANISMS PRESENT, NONE PREDOMINANT NO ANAEROBES ISOLATED Performed at [facility], [place] [address] [place], [place] [address]    REPTSTATUS 01/30/2021 FINAL 01/24/2021 1308    Other culture-see note  Objective: Vitals: Today's Vitals   02/06/21 0958 02/06/21 1609 02/07/21 0000 02/07/21 0730  BP:      Pulse: (!) 112     Resp: 16     Temp:      TempSrc:      SpO2:      Weight:      Height:      PainSc: Asleep Asleep Asleep 10-Worst pain ever    Intake/Output Summary (Last 24 hours) at 02/07/2021 1124 Last data filed at 02/07/2021 2025 Gross per 24 hour  Intake 5 ml  Output 400 ml  Net -395 ml   Filed Weights   01/30/21 0433 01/31/21 0624 02/05/21 2052  Weight: 72 kg 72.2 kg (!) 147.6 kg   Weight change:   Intake/Output from previous day: 03/17 0701 - 03/18 0700 In: 25 [I.V.:20] Out: 400 [Urine:400] Intake/Output this shift: No intake/output data recorded. Filed Weights   01/30/21 0433 01/31/21 0624 02/05/21 2052  Weight: 72 kg 72.2 kg (!) 147.6 kg    Examination: General exam: Sleeping/resting medicated 30 minutes ago did not disturb her wake her up.Husband and  son at the bedside.  HEENT:Oral mucosa dry, Ear/Nose WNL grossly. Respiratory system: bilaterally diminishedd,no wheezing or crackles,no use of accessory muscle Cardiovascular system: S1 & S2 +, No JVD,. Gastrointestinal system: Abdomen with colostomy in place, JP drain +.  Abdominal wound w/ dressing intact Nervous System: Sedated/sleeping  Extremities: edema +, distal peripheral pulses palpable.  Skin: No rashes,no icterus. MSK: Normal muscle bulk,tone, power  Data Reviewed: I have personally reviewed following labs and imaging studies CBC: Recent Labs  Lab 02/01/21 0546 02/02/21 0825 02/03/21 0512 02/04/21 0545  WBC 7.4 7.0 4.8 7.6  HGB 9.3* 9.1* 8.5* 9.8*  HCT 31.1* 31.5* 29.4* 34.3*  MCV 107.6* 112.1* 112.2* 112.8*  PLT 93* 90* 80* 72*   Basic Metabolic Panel: Recent Labs  Lab 02/01/21 0546 02/02/21 0825 02/03/21 0512 02/04/21 0545  NA 139 140 142 140  K 3.8 3.3* 3.5 3.5  CL 107 112* 113* 111  CO2 20* 18* 17* 19*  GLUCOSE 77 86 110* 120*  BUN 59* 55* 53* 48*  CREATININE 2.15* 2.14* 2.00* 1.95*  CALCIUM 8.5* 8.7* 8.7* 8.9   GFR: Estimated Creatinine Clearance: 40.9 mL/min (A) (by C-G formula based on SCr of 1.95 mg/dL (H)). Liver Function Tests: No results for input(s): AST, ALT, ALKPHOS, BILITOT, PROT, ALBUMIN in the last 168 hours. No results for input(s): LIPASE, AMYLASE in the last 168 hours. No results for input(s): AMMONIA in the last 168 hours. Coagulation Profile: No results for input(s): INR, PROTIME in the last 168 hours. Cardiac Enzymes: No results for input(s): CKTOTAL, CKMB, CKMBINDEX, TROPONINI in the last 168 hours. BNP (last 3 results) No results for input(s): PROBNP in the last 8760 hours. HbA1C: No results for input(s): HGBA1C in the last 72 hours. CBG: Recent Labs  Lab 02/04/21 0732 02/04/21 1158 02/04/21 1702 02/04/21 2017 02/05/21 0730  GLUCAP 113* 162* 197* 190* 105*   Lipid Profile: No results for input(s): CHOL, HDL,  LDLCALC, TRIG, CHOLHDL, LDLDIRECT in the last 72 hours. Thyroid Function Tests: No results for input(s): TSH, T4TOTAL, FREET4, T3FREE, THYROIDAB in the last 72 hours. Anemia Panel: No results for input(s): VITAMINB12, FOLATE, FERRITIN, TIBC, IRON, RETICCTPCT in the last 72 hours. Sepsis Labs: No results for input(s): PROCALCITON, LATICACIDVEN in the last 168 hours.  No results found for this or any previous visit (from the past 240 hour(s)).   Radiology Studies: No results found.   LOS: 18 days   Antonieta Pert, MD [facility]  02/07/2021, 11:24 AM

## 2021-02-08 DIAGNOSIS — G9341 Metabolic encephalopathy: Secondary | ICD-10-CM | POA: Diagnosis not present

## 2021-02-08 DIAGNOSIS — Z515 Encounter for palliative care: Secondary | ICD-10-CM | POA: Diagnosis not present

## 2021-02-08 DIAGNOSIS — R531 Weakness: Secondary | ICD-10-CM | POA: Diagnosis not present

## 2021-02-21 NOTE — Progress Notes (Signed)
Time of death 2305 verified by second RN. Family at bedside. Jeannette Corpus notified. Organ donor called.

## 2021-02-21 NOTE — Death Summary Note (Signed)
DEATH SUMMARY   Patient Details  Name: April [name] MRN: [id number] DOB: 29-May-1956  Admission/Discharge Information   Admit Date:  02-13-2021  Date of Death: Date of Death: March 04, 2021  Time of Death: Time of Death: 12-22-03  Length of Stay: 01/05/2024  Referring Physician: Gweneth Dimitri, MD   Reason(s) for Hospitalization  Altered mental status.  Diagnoses  Preliminary cause of death: Failure to thrive Secondary Diagnoses (including complications and co-morbidities):  Postop intra-abdominal fluid collection with abscess needing IR drain 3/4 Recent exploratory laparoscopy and colostomy on 12/29/2020 Dr Blackmann:CT abdomen 3/11 interval resolution of the left lower quadrant collection- drain in place. not taking po.   C. difficile colitis treated w/ vancomycin Acute metabolic encephalopathy-mentation had improved but patient started to get lethargic weak  Acute on chronic diastolic CHF/Anasarca:status post Lasix and stopped due to AKI. Acute on chronic respiratory failure with hypoxia/COPD/Severe persistent asthma : normally on 2l [place] AKI on CKD stage IIIa/Metabolic acodisis- baseline creatinine variable 0.9-1.8 in WUJ:[id number] ~1.9. BUN 62>48. off Lasix due to AKI. ESRD hx/PCKD-s/p renal transplant 2011/12/22 on immunosuppressant-Prograf, mycophenolate,and prednisone.she has been on  for her renal transplant in 12-22-2011.Tacrolimus level 6.0 on 12/27/20. History of adrenal insufficiency was on prednisone 7.5 mg. Hypotension:BP stable Acute urine retention: cont purewick Thrombocytopenia: In the setting of acute illness,?Etiology,last platelet was at 72.  Acute on chronic anemia/Normocytic anemia/Iron deficiency anemia/Large hematoma:Left arm drained and dressed: Anemia in the setting of recent abdominal surgery left arm hematoma and chronic disease. Typical atrial flutter, TEE/cardioversion 2/6 and in sinus and has been on Eliquis. T2DM: Blood sugar w/ a episode of hypoglycemia 65 on 3/9 am-CBG was low  due to poor intake and Lantus was discontinued altogether 3/12 Metabolic acidosis with low bicarb;Resolved. Depression Raynad's Possible OSA Gout Recent prolonged hospitalization 12/16/20-01/14/21 for hypoxic resp failure. New onset a fib rvr, diverticulitis with perforation s/p lapratomy w/ hartman's procedure with post op hypotension/septic shock- needing pressors, volume overload, blood loss anemia-then d/c to SNF.  Failure to thrive with deconditioning/Poor appetite/very poor intake  Goals of care/end-of-life care:DNR.Palliative care following.Daughter reports ever since her extubation she has not perked up.patient was managed to follow maximal support but continued to do do poorly.Given complex comorbidities, failure to thrive with extremely poor oral intake-patient completed MOST form and did not want any artificial tube feeding.  Further palliative care meeting held-patient elected to transition to comfort measures.Palliative care following and managing was placed on Dilaudid drip and patient has been comfortable.  Family is at the bedside.   Brief Hospital Course (including significant findings, care, treatment, and services provided and events leading to death)  April [name] is a 65 y.o. year old female who 65 year old female with medical history of A flutter status post, chronic respiratory failure on 2 L oxygen, asthma, OSA, CKD stage IIIa, ESRD post transplant, chronic diastolic heart failure, diabetes presented secondary to acute encephalopathy with associated hypotension in setting of intraabdominal fluid collection and recent intraabdominal surgery. General surgery and IR was consulted for management. Patient managed empirically on antibiotics. Found to have C. Difficile and started on Vancomycin PO.  Status post IR drain placement 3/4 managed by IR, general surgery.  Patient has colostomy and lower abdominal wall wound and managed by dressing changes. Patient remained very weak  deconditioned with poor oral intake.Palliative care on board, She continued to worsen has not been eating or drinking deconditioned frail and essentially bedbound.  Nursing facility assessed by PT OT but patient family declined-family wanted to take  her home.  Patient remained at risk of decompensation/high risk of readmission.she was being monitored very closely.  Patient completed a MOST form and did not desire to have feeding tube/aggressive measures etcs.  Continue to decompensate was not eating and was having failure to thrive.Patient and family decided on transition to comfort measures 3/16.She remains on comfort measures. She peacefully passed away with family at bedside on 02/13/2021  Pertinent Labs and Studies  Significant Diagnostic Studies CT ABDOMEN PELVIS WO CONTRAST  Result Date: 02/01/2021 CLINICAL DATA:  65 year old female with a history of left lower quadrant diverticular abscess status post percutaneous drain placement on 01/24/2021. EXAM: CT ABDOMEN AND PELVIS WITHOUT CONTRAST TECHNIQUE: Multidetector CT imaging of the abdomen and pelvis was performed following the standard protocol without IV contrast. COMPARISON:  Prior CT abdomen/pelvis February 17, 2021 FINDINGS: Lower chest: Small bilateral pleural effusions with associated subsegmental atelectasis. The intracardiac blood pool is hypodense relative to the adjacent myocardium consistent with anemia. No pericardial effusion. Atherosclerotic calcifications visualized along the coronary arteries. Hepatobiliary: Numerous circumscribed low-attenuation liver lesions, similar compared to prior imaging and likely reflecting simple cysts. High attenuation material layers dependently within the gallbladder lumen consistent with sludge and/or small stones. No biliary ductal dilatation. Pancreas: Unremarkable. No pancreatic ductal dilatation or surrounding inflammatory changes. Spleen: Normal in size without focal abnormality. Adrenals/Urinary Tract:  Unremarkable adrenal glands. Innumerable circumscribed low-attenuation cysts throughout both kidneys. 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 Unremarkable ureters and bladder. Transplant kidney in the right lower quadrant without evidence of hydronephrosis. Stomach/Bowel: Colonic diverticular disease without CT evidence of active inflammation. Surgical changes of prior partial left colectomy with Hartmann's pouch and left lower quadrant end colostomy. A percutaneous drainage catheter is present adjacent to the stump of the Hartmann's pouch. Vascular/Lymphatic: Limited evaluation in the absence of intravenous contrast. No evidence of aneurysm. Atherosclerotic calcifications present throughout the abdominal aorta. No suspicious lymphadenopathy. Reproductive: Uterus and bilateral adnexa are unremarkable. Other: Interval resolution of fluid collection from within the left lower quadrant adjacent to the Hartmann's pouch stump. Small volume ascites in the right pericolic gutter. Open midline incision. No evidence of hernia. Musculoskeletal: No acute osseous abnormality. Surgical changes of prior left total hip arthroplasty. Dependent body wall edema. IMPRESSION: 1. Interval resolution of left lower quadrant abscess collection with well-positioned percutaneous drainage catheter. 2. Small bilateral pleural effusions with associated atelectasis. 3. Small volume free fluid in the right pericolic gutter. 4. The intracardiac blood pool is hypodense relative to the adjacent myocardium consistent with anemia. 5. Additional ancillary findings as above without significant interval change. Electronically Signed   By: Malachy Moan M.D.   On: 02/01/2021 11:00   CT ABDOMEN PELVIS WO CONTRAST  Result Date: 02/17/21 CLINICAL DATA:  Respiratory failure, history of kidney transplant, recent abdominal surgery with drainage EXAM: CT CHEST, ABDOMEN AND PELVIS WITHOUT CONTRAST TECHNIQUE:  Multidetector CT imaging of the chest, abdomen and pelvis was performed following the standard protocol without IV contrast. Unenhanced CT was performed per clinician order. Lack of IV contrast limits sensitivity and specificity, especially for evaluation of abdominal/pelvic solid viscera. COMPARISON:  05/14/2020, 12/29/2020 FINDINGS: CT CHEST FINDINGS Cardiovascular: Unenhanced imaging of the heart and great vessels demonstrates no pericardial effusion. Normal caliber of the thoracic aorta. Minimal atherosclerosis. Incidental note is made of prominent vascular structures in the left upper extremity consistent with previous AV fistula compatible with history of renal failure. Mediastinum/Nodes: No enlarged mediastinal, hilar, or axillary lymph nodes. Thyroid gland, trachea, and esophagus demonstrate no  significant findings. Lungs/Pleura: There are small bilateral pleural effusions, right greater than left. Bilateral lower lobe atelectasis greatest in the right lower lobe. Patchy lingular consolidation may reflect hypoventilatory change versus early infection. No pneumothorax. Central airways are patent. Musculoskeletal: Prior healed bilateral rib fractures. No acute or destructive bony lesions. Reconstructed images demonstrate no additional findings. CT ABDOMEN PELVIS FINDINGS Hepatobiliary: Multiple hepatic cysts are again noted. Otherwise unremarkable unenhanced evaluation of the liver. Small gallstones are identified within the gallbladder with no evidence of acute cholecystitis. Pancreas: Unremarkable. No pancreatic ductal dilatation or surrounding inflammatory changes. Spleen: Mild splenomegaly unchanged.  No focal abnormalities. Adrenals/Urinary Tract: Numerous cysts are seen throughout the native kidneys, unchanged. Right lower quadrant renal transplant is unremarkable. No urinary tract calculi or obstructive uropathy. Bladder is unremarkable without filling defect. The adrenals are normal. Stomach/Bowel: No  bowel obstruction or ileus. Diverting colostomy left lower quadrant is noted. Minimal diverticulosis of the remaining sigmoid colon. There is a fluid collection in the left lower quadrant measuring 3.3 x 2.8 cm on image 92/7, and extending approximately 5.3 cm in craniocaudal direction image 127/4. Fluid collection abuts the enterotomy staple line in the left lower quadrant, and could reflect postoperative seroma or abscess. Evaluation is limited without IV and oral contrast. No bowel wall thickening.  Normal appendix right lower quadrant. Vascular/Lymphatic: Aortic atherosclerosis. No enlarged abdominal or pelvic lymph nodes. Reproductive: Uterus and bilateral adnexa are unremarkable. Other: Postsurgical changes are seen from midline laparotomy, with residual surgical defect and packing. No subcutaneous fluid collection. There is a left inguinal hernia containing a small amount of fluid, though decreased since prior study. No evidence of bowel herniation. Aside from the left lower quadrant fluid collection described above, no additional areas of intra-abdominal fluid or free gas. Musculoskeletal: Postsurgical changes from left hip arthroplasty. There are no acute or destructive bony lesions. Reconstructed images demonstrate no additional findings. IMPRESSION: 1. Fluid collection left lower quadrant abutting the staple line of the sigmoid colon. Findings could reflect postoperative seroma or abscess. Evaluation limited without IV and oral contrast. 2. Small bilateral pleural effusions and bilateral lower lobe atelectasis, right greater than left. 3. Patchy non dependent consolidation within the lingula, which could reflect hypoventilatory change or early infection. 4. Sequela of end-stage renal disease, with unremarkable transplant kidney right lower quadrant. 5. Postsurgical changes from midline laparotomy and left lower quadrant colostomy. Surgical defect from the midline laparotomy without fluid collection or  abdominal wall abscess. 6. Stable splenomegaly. 7.  Aortic Atherosclerosis (ICD10-I70.0). Electronically Signed   By: Sharlet Salina M.D.   On: 02-14-21 15:14   DG Elbow 2 Views Right  Result Date: 01/12/2021 CLINICAL DATA:  Right elbow pain. EXAM: RIGHT ELBOW - 2 VIEW COMPARISON:  None. FINDINGS: No gross fracture or dislocation on this portable study. Exam is limited by positioning and assessment for joint effusion is limited. No worrisome lytic or sclerotic osseous abnormality. IMPRESSION: Limited study due to positioning. No gross fracture or dislocation. Nondisplaced radial neck fracture a concern. Repeat dedicated elbow films recommended when the patient is better able to participate with positioning. Electronically Signed   By: Kennith Center M.D.   On: 01/12/2021 13:47   CT Chest Wo Contrast  Result Date: 02/14/21 CLINICAL DATA:  Respiratory failure, history of kidney transplant, recent abdominal surgery with drainage EXAM: CT CHEST, ABDOMEN AND PELVIS WITHOUT CONTRAST TECHNIQUE: Multidetector CT imaging of the chest, abdomen and pelvis was performed following the standard protocol without IV contrast. Unenhanced CT was performed per  clinician order. Lack of IV contrast limits sensitivity and specificity, especially for evaluation of abdominal/pelvic solid viscera. COMPARISON:  05/14/2020, 12/29/2020 FINDINGS: CT CHEST FINDINGS Cardiovascular: Unenhanced imaging of the heart and great vessels demonstrates no pericardial effusion. Normal caliber of the thoracic aorta. Minimal atherosclerosis. Incidental note is made of prominent vascular structures in the left upper extremity consistent with previous AV fistula compatible with history of renal failure. Mediastinum/Nodes: No enlarged mediastinal, hilar, or axillary lymph nodes. Thyroid gland, trachea, and esophagus demonstrate no significant findings. Lungs/Pleura: There are small bilateral pleural effusions, right greater than left. Bilateral lower  lobe atelectasis greatest in the right lower lobe. Patchy lingular consolidation may reflect hypoventilatory change versus early infection. No pneumothorax. Central airways are patent. Musculoskeletal: Prior healed bilateral rib fractures. No acute or destructive bony lesions. Reconstructed images demonstrate no additional findings. CT ABDOMEN PELVIS FINDINGS Hepatobiliary: Multiple hepatic cysts are again noted. Otherwise unremarkable unenhanced evaluation of the liver. Small gallstones are identified within the gallbladder with no evidence of acute cholecystitis. Pancreas: Unremarkable. No pancreatic ductal dilatation or surrounding inflammatory changes. Spleen: Mild splenomegaly unchanged.  No focal abnormalities. Adrenals/Urinary Tract: Numerous cysts are seen throughout the native kidneys, unchanged. Right lower quadrant renal transplant is unremarkable. No urinary tract calculi or obstructive uropathy. Bladder is unremarkable without filling defect. The adrenals are normal. Stomach/Bowel: No bowel obstruction or ileus. Diverting colostomy left lower quadrant is noted. Minimal diverticulosis of the remaining sigmoid colon. There is a fluid collection in the left lower quadrant measuring 3.3 x 2.8 cm on image 92/7, and extending approximately 5.3 cm in craniocaudal direction image 127/4. Fluid collection abuts the enterotomy staple line in the left lower quadrant, and could reflect postoperative seroma or abscess. Evaluation is limited without IV and oral contrast. No bowel wall thickening.  Normal appendix right lower quadrant. Vascular/Lymphatic: Aortic atherosclerosis. No enlarged abdominal or pelvic lymph nodes. Reproductive: Uterus and bilateral adnexa are unremarkable. Other: Postsurgical changes are seen from midline laparotomy, with residual surgical defect and packing. No subcutaneous fluid collection. There is a left inguinal hernia containing a small amount of fluid, though decreased since prior  study. No evidence of bowel herniation. Aside from the left lower quadrant fluid collection described above, no additional areas of intra-abdominal fluid or free gas. Musculoskeletal: Postsurgical changes from left hip arthroplasty. There are no acute or destructive bony lesions. Reconstructed images demonstrate no additional findings. IMPRESSION: 1. Fluid collection left lower quadrant abutting the staple line of the sigmoid colon. Findings could reflect postoperative seroma or abscess. Evaluation limited without IV and oral contrast. 2. Small bilateral pleural effusions and bilateral lower lobe atelectasis, right greater than left. 3. Patchy non dependent consolidation within the lingula, which could reflect hypoventilatory change or early infection. 4. Sequela of end-stage renal disease, with unremarkable transplant kidney right lower quadrant. 5. Postsurgical changes from midline laparotomy and left lower quadrant colostomy. Surgical defect from the midline laparotomy without fluid collection or abdominal wall abscess. 6. Stable splenomegaly. 7.  Aortic Atherosclerosis (ICD10-I70.0). Electronically Signed   By: Sharlet Salina M.D.   On: 01/20/2021 15:14   IR Sinus/Fist Tube Chk-Non GI  Result Date: 02/03/2021 CLINICAL DATA:  65 year old female with history of diverticular abscess and left lower quadrant pigtail drainage catheter placement on 01/24/2021. EXAM: SINUS TRACT INJECTION/FISTULOGRAM COMPARISON:  01/24/2021 CONTRAST:  10 mL Omnipaque 300-administered via the existing percutaneous drain. FLUOROSCOPY TIME:  42 seconds, 5 mGy TECHNIQUE: The patient was positioned supine on the fluoroscopy table. A preprocedural spot fluoroscopic image  was obtained of the left lower quadrant and the existing percutaneous drainage catheter. Multiple spot fluoroscopic and radiographic images were obtained following the injection of a small amount of contrast via the existing percutaneous drainage catheter. FINDINGS: Free  flow of contrast material into the left pericolic gutter as well as tracking medially and inferiorly in the pelvis. Delayed images demonstrate opacification of the sigmoid colon and rectum. 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 PLAN: Keep drainage catheter to bulb suction. IR will continue to follow while inpatient. Plan to follow up with repeat drain injection in 1 month. Marliss Coots, MD [facility] Electronically Signed   By: Marliss Coots MD   On: 02/03/2021 12:05   DG Chest Portable 1 View  Result Date: 01/26/21 CLINICAL DATA:  Hypoxia EXAM: PORTABLE CHEST 1 VIEW COMPARISON:  01/01/2021 FINDINGS: Central line is been removed since the prior study. Improved aeration in the bases with residual mild bibasilar airspace disease likely atelectasis. Negative for edema or effusion. IMPRESSION: Mild bibasilar atelectasis Electronically Signed   By: Marlan Palau M.D.   On: 26-Jan-2021 14:04   CT IMAGE GUIDED DRAINAGE BY PERCUTANEOUS CATHETER  Result Date: 01/24/2021 INDICATION: 65 year old woman with left lower quadrant abscess presents to interventional radiology for CT-guided drain placement. EXAM: CT GUIDED DRAINAGE OF LEFT LOWER QUADRANT ABSCESS MEDICATIONS: The patient is currently admitted to the hospital and receiving intravenous antibiotics. The antibiotics were administered within an appropriate time frame prior to the initiation of the procedure. ANESTHESIA/SEDATION: 1 mg IV Versed 50 mcg IV Fentanyl Moderate Sedation Time:  11 minutes The patient was continuously monitored during the procedure by the interventional radiology nurse under my direct supervision. COMPLICATIONS: None immediate. TECHNIQUE: Informed written consent was obtained from the patient after a thorough discussion of the procedural risks, benefits and alternatives. All questions were addressed. Maximal  Sterile Barrier Technique was utilized including caps, mask, sterile gowns, sterile gloves, sterile drape, hand hygiene and skin antiseptic. A timeout was performed prior to the initiation of the procedure. PROCEDURE: Patient positioned supine on the procedure table. The left lower quadrant anterior abdominal wall skin was prepped with Chlorhexidine in a sterile fashion, and a sterile drape was applied covering the operative field. A sterile gown and sterile gloves were used for the procedure. Local anesthesia was provided with 1% Lidocaine. Following local lidocaine administration, 18 gauge needle was advanced into the left lower quadrant collection utilizing CT guidance. Purulent material was noted to return from the needle. The needle was removed over 0.035 inch guidewire. 10.2 [place] multipurpose pigtail drain inserted over the guidewire. 30 mL of purulent material aspirated. Samples sent for Gram stain and culture. Drain secured to skin with suture and connected to bulb suction. FINDINGS: Left lower quadrant abscess IMPRESSION: 10.2 [place] multipurpose pigtail drain placed in left lower quadrant abscess utilizing CT guidance. Electronically Signed   By: Acquanetta Belling M.D.   On: 01/24/2021 14:31    Microbiology No results found for this or any previous visit (from the past 240 hour(s)).  Lab Basic Metabolic Panel: Recent Labs  Lab 02/04/21 0545  NA 140  K 3.5  CL 111  CO2 19*  GLUCOSE 120*  BUN 48*  CREATININE 1.95*  CALCIUM 8.9   Liver Function Tests: No results for input(s): AST, ALT, ALKPHOS, BILITOT, PROT, ALBUMIN in the last 168 hours. No results for input(s): LIPASE, AMYLASE in the last 168 hours. No results for input(s): AMMONIA  in the last 168 hours. CBC: Recent Labs  Lab 02/04/21 0545  WBC 7.6  HGB 9.8*  HCT 34.3*  MCV 112.8*  PLT 72*   Cardiac Enzymes: No results for input(s): CKTOTAL, CKMB, CKMBINDEX, TROPONINI in the last 168 hours. Sepsis Labs: Recent Labs  Lab  02/04/21 0545  WBC 7.6    Procedures/Operations    Taquita Demby 02/10/2021, 5:22 PM

## 2021-02-21 NOTE — Progress Notes (Signed)
PROGRESS NOTE    April [name]  IRC:[id number] DOB: 06-Feb-1956 DOA: 01/08/2021 PCP: [name], MD   Chief Complaint  Patient presents with  . Hypotension  . hypoxia   Brief Narrative: 65 year old female with medical history of A flutter status post, chronic respiratory failure on 2 L oxygen, asthma, OSA, CKD stage IIIa, ESRD post transplant, chronic diastolic heart failure, diabetes presented secondary to acute encephalopathy with associated hypotension in setting of intraabdominal fluid collection and recent intraabdominal surgery. General surgery and IR was consulted for management. Patient managed empirically on antibiotics. Found to have C. Difficile and started on Vancomycin PO.  Status post IR drain placement 3/4 managed by IR, general surgery.  Patient has colostomy and lower abdominal wall wound and managed by dressing changes. Patient remained very weak deconditioned with poor oral intake.Palliative care on board, She continued to worsen has not been eating or drinking deconditioned frail and essentially bedbound.  Nursing facility assessed by PT OT but patient family declined-family wanted to take her home.  Patient remained at risk of decompensation/high risk of readmission.Patient completed a MOST form and did not desire to have feeding tube/aggressive measures etcs. Patient family decided on transition to comfort measures 3/16.She remains on comfort measures.  Subjective: family at bedside maintaining vigil Patient appears comfortable on Dilaudid drip.   No other questions  Assessment & Plan: Postop intra-abdominal fluid collection with abscess needing IR drain 3/4 Recent exploratory laparoscopy and colostomy on 12/29/2020 Dr Blackmann:CT abdomen 3/11 interval resolution of the left lower quadrant collection- drain in place. not taking po.   C. difficile colitis treated w/ vancomycin Acute metabolic encephalopathy-mentation had improved but patient started to get  lethargic weak  Acute on chronic diastolic CHF/Anasarca:status post Lasix and stopped due to AKI. Acute on chronic respiratory failure with hypoxia, normally on 2l [place] AKI on CKD stage IIIa/Metabolic acodisis- baseline creatinine variable 0.9-1.8 in PZW:CHENIDPOEU ~1.9. BUN 62>48. off Lasix due to AKI. ESRD hx/PCKD-s/p renal transplant 2013 on immunosuppressant-Prograf, mycophenolate,and prednisone nd also poor oral intake at risk for AKI.She has been on  for her renal transplant in 2013.Tacrolimus level 6.0 on 12/27/20. History of adrenal insufficiency was on prednisone 7.5 mg. Hypotension:BP stable Acute urine retention: cont purewick Thrombocytopenia: In the setting of acute illness,?Etiology,last platelet was at 72.  Acute on chronic anemia/Normocytic anemia/Iron deficiency anemia/Large hematoma:Left arm drained and dressed: Anemia in the setting of recent abdominal surgery left arm hematoma and chronic disease. Typical atrial flutter, TEE/cardioversion 2/6 and in sinus and has been on Eliquis. T2DM: Blood sugar w/ a episode of hypoglycemia 66 on 3/9 am-CBG was low due to poor intake and Lantus was discontinued altogether 2/35 Metabolic acidosis with low bicarb;Resolved. Depression Raynad's Possible OSA Gout Failure to thrive with deconditioning/Poor appetite/very poor intake:  Goals of care/end-of-life care:DNR.Palliative care following..Daughter reports ever since her extubation she has not perked up.patient was managed to follow maximal support but continued to do do poorly.Given complex comorbidities, failure to thrive with extremely poor oral intake-patient completed MOST form and did not want any artificial tube feeding.  Further palliative care meeting held-patient elected to transition to comfort measures.  Palliative care following and managing was placed on Dilaudid drip yesterday and patient has been comfortable.  Family is at the bedside.   DVT prophylaxis: Heprain iv Code Status:    Code Status: DNR Family Communication: plan of care discussed with patient's family at bedside.  Status is: Inpatient Remains inpatient appropriate because of end-of-life care Dispo: The patient  is from: Home              Anticipated d/c is to: Anticipate hospital death  Unresulted Labs (From admission, onward)         None     Medications reviewed:  Scheduled Meds: . brimonidine  1 drop Both Eyes BID  . chlorhexidine  15 mL Mouth Rinse BID  . cycloSPORINE  1 drop Both Eyes BID  . dorzolamide  1 drop Both Eyes Daily  . hydrocortisone   Rectal BID  . latanoprost  1 drop Both Eyes QHS  . predniSONE  7.5 mg Oral Q breakfast  . sodium chloride flush  3 mL Intravenous Q12H   Continuous Infusions: . sodium chloride 10 mL/hr at 01/28/21 0832  . sodium chloride Stopped (01/30/21 0105)  . HYDROmorphone 0.5 mg/hr (02/21/21 0321)  . promethazine (PHENERGAN) injection      Consultants:see note  Procedures:see note  Antimicrobials: Anti-infectives (From admission, onward)   Start     Dose/Rate Route Frequency Ordered Stop   02/04/21 1000  amoxicillin-clavulanate (AUGMENTIN) 500-125 MG per tablet 500 mg  Status:  Discontinued        1 tablet Oral 2 times daily 02/04/21 0815 02/07/21 1214   02/03/21 2200  vancomycin (VANCOCIN) 50 mg/mL oral solution 125 mg  Status:  Discontinued        125 mg Oral Every 12 hours 02/03/21 1548 02/05/21 1517   01/21/21 2200  vancomycin (VANCOCIN) 50 mg/mL oral solution 125 mg        125 mg Oral 4 times daily 01/21/21 2028 01/31/21 2159   01/21/21 1800  ceFEPIme (MAXIPIME) 2 g in sodium chloride 0.9 % 100 mL IVPB  Status:  Discontinued        2 g 200 mL/hr over 30 Minutes Intravenous Every 24 hours 01/06/2021 1742 02/04/21 0815   01/21/21 0000  metroNIDAZOLE (FLAGYL) IVPB 500 mg  Status:  Discontinued        500 mg 100 mL/hr over 60 Minutes Intravenous Every 8 hours 12/28/2020 1821 02/04/21 0815   12/30/2020 1800  vancomycin (VANCOCIN) IVPB 1000 mg/200 mL  premix  Status:  Discontinued        1,000 mg 200 mL/hr over 60 Minutes Intravenous Every 48 hours 12/31/2020 1742 01/22/21 1053   01/10/2021 1545  ceFEPIme (MAXIPIME) 2 g in sodium chloride 0.9 % 100 mL IVPB        2 g 200 mL/hr over 30 Minutes Intravenous  Once 01/04/2021 1544 01/16/2021 1625   01/15/2021 1545  metroNIDAZOLE (FLAGYL) IVPB 500 mg        500 mg 100 mL/hr over 60 Minutes Intravenous  Once 01/16/2021 1544 01/16/2021 1730   12/30/2020 1545  vancomycin (VANCOCIN) IVPB 1000 mg/200 mL premix  Status:  Discontinued        1,000 mg 200 mL/hr over 60 Minutes Intravenous  Once 01/14/2021 1544 01/01/2021 1847     Culture/Microbiology    Component Value Date/Time   SDES  01/24/2021 1308    ABSCESS Performed at [facility], [place] [address], [place], [place] [address]    SPECREQUEST  01/24/2021 1308    NONE Performed at [facility], [place] [address] [place], [place] [address]    CULT (A) 01/24/2021 1308    MULTIPLE ORGANISMS PRESENT, NONE PREDOMINANT NO ANAEROBES ISOLATED Performed at [facility], [place] [address] [place], [place] [address]    REPTSTATUS 01/30/2021 FINAL 01/24/2021 1308    Other culture-see  note  Objective: Vitals: Today's Vitals   02/07/21 1401 22-Feb-2021 0000 February 22, 2021 0442 02-22-2021 1042  BP: (!) 117/98  (!) 82/42   Pulse: (!) 114  (!) 132   Resp:   14   Temp:   100 F (37.8 C)   TempSrc:   Oral   SpO2:   91%   Weight:      Height:      PainSc:  1   Asleep    Intake/Output Summary (Last 24 hours) at February 22, 2021 1338 Last data filed at 02/22/2021 0321 Gross per 24 hour  Intake 11.9 ml  Output 0 ml  Net 11.9 ml   Filed Weights   01/30/21 0433 01/31/21 0624 02/05/21 2052  Weight: 72 kg 72.2 kg (!) 147.6 kg   Weight change:   Intake/Output from previous day: 03/18 0701 - 03/19 0700 In: 11.9 [I.V.:11.9] Out: 0  Intake/Output this shift: No intake/output data recorded. Filed Weights   01/30/21 0433 01/31/21 0624  02/05/21 2052  Weight: 72 kg 72.2 kg (!) 147.6 kg    Examination: General exam: Appears chronically ill  Respiratory system: Breathing spontaneously, shallow breathing, at times apneic pauses  Cardiovascular system: Tachycardia, S1/S2  Gastrointestinal system: Abdomen soft, colostomy in place, dressing intact  Nervous System:resting/lExtremities: No edema, distal peripheral pulses palpable.  Skin: Peripheral edema present   Data Reviewed: I have personally reviewed following labs and imaging studies CBC: Recent Labs  Lab 02/02/21 0825 02/03/21 0512 02/04/21 0545  WBC 7.0 4.8 7.6  HGB 9.1* 8.5* 9.8*  HCT 31.5* 29.4* 34.3*  MCV 112.1* 112.2* 112.8*  PLT 90* 80* 72*   Basic Metabolic Panel: Recent Labs  Lab 02/02/21 0825 02/03/21 0512 02/04/21 0545  NA 140 142 140  K 3.3* 3.5 3.5  CL 112* 113* 111  CO2 18* 17* 19*  GLUCOSE 86 110* 120*  BUN 55* 53* 48*  CREATININE 2.14* 2.00* 1.95*  CALCIUM 8.7* 8.7* 8.9   GFR: Estimated Creatinine Clearance: 40.9 mL/min (A) (by C-G formula based on SCr of 1.95 mg/dL (H)). Liver Function Tests: No results for input(s): AST, ALT, ALKPHOS, BILITOT, PROT, ALBUMIN in the last 168 hours. No results for input(s): LIPASE, AMYLASE in the last 168 hours. No results for input(s): AMMONIA in the last 168 hours. Coagulation Profile: No results for input(s): INR, PROTIME in the last 168 hours. Cardiac Enzymes: No results for input(s): CKTOTAL, CKMB, CKMBINDEX, TROPONINI in the last 168 hours. BNP (last 3 results) No results for input(s): PROBNP in the last 8760 hours. HbA1C: No results for input(s): HGBA1C in the last 72 hours. CBG: Recent Labs  Lab 02/04/21 0732 02/04/21 1158 02/04/21 1702 02/04/21 2017 02/05/21 0730  GLUCAP 113* 162* 197* 190* 105*   Lipid Profile: No results for input(s): CHOL, HDL, LDLCALC, TRIG, CHOLHDL, LDLDIRECT in the last 72 hours. Thyroid Function Tests: No results for input(s): TSH, T4TOTAL, FREET4,  T3FREE, THYROIDAB in the last 72 hours. Anemia Panel: No results for input(s): VITAMINB12, FOLATE, FERRITIN, TIBC, IRON, RETICCTPCT in the last 72 hours. Sepsis Labs: No results for input(s): PROCALCITON, LATICACIDVEN in the last 168 hours.  No results found for this or any previous visit (from the past 240 hour(s)).   Radiology Studies: No results found.   LOS: 19 days   Antonieta Pert, MD [facility]  22-Feb-2021, 1:38 PM

## 2021-02-21 NOTE — Progress Notes (Signed)
Speech Language Pathology Discharge Patient Details Name: April [name] MRN: [id number] DOB: October 20, 1956 Today's Date: 02/15/21 Time:  -     Patient discharged from SLP services secondary to medical decline - will need to re-order SLP to resume therapy services.  Per notes, anticipate hospital death and pt comfort care.   Please see latest therapy progress note for current level of functioning and progress toward goals.    Progress and discharge plan discussed with patient and/or caregiver: Patient unable to participate in discharge planning and no caregivers available  GO   Kathleen Lime, MS [place] Office [phone] Pager [phone]    [name] 02-15-21, 4:56 PM

## 2021-02-21 NOTE — Progress Notes (Signed)
   Daily Progress Note   Patient Name: GENESSIS FLANARY       Date: 2021-03-07 DOB: [birth year]  Age: 65 y.o. MRN#: [id number] Attending Physician: Antonieta Pert, MD Primary Care Physician: [name], MD Admit Date: 01/10/2021  Reason for Consultation/Follow-up: Establishing goals of care  Subjective: Patient is resting in bed.  Shallow breathing, apneic pauses noted, family holding vigil at bedside. Overall, patient appears comfortable, now on Dilaudid drip.    Palliative performance scale 20%  Length of Stay: 19 days  Vital Signs: BP (!) 82/42 (BP Location: Right Arm)   Pulse (!) 132   Temp 100 F (37.8 C) (Oral)   Resp 14   Ht 5\' 1"  (1.549 m)   Wt (!) 147.6 kg   SpO2 91%   BMI 61.48 kg/m  SpO2: SpO2: 91 % O2 Device: O2 Device: Nasal Cannula O2 Flow Rate: O2 Flow Rate (L/min): 4 L/min  Physical Exam: Appears chronically ill Appears with generalized weakness Shallow respirations with apneic spells noted.  Has some increasing peripheral edema S1-S2            Palliative Care Assessment & Plan    Code Status:  DNR  Goals of Care/Recommendations:  DNR/DNI  Now on comfort measures Anticipated hospital death, prognosis likely limited to few days, continue to support the patient and family.    Prognosis: hours to some very limited number of days.  End-of-life signs and symptoms discussed with family at bedside. Offered support.   Discharge Planning: anticipated hospital death.   Thank you for allowing the Palliative Medicine Team to assist in the care of this patient.  Time Total: 25 min.   Greater than 50%  of this time was spent counseling and coordinating care related to the above assessment and plan.  Loistine Chance, MD [place] Team [phone]

## 2021-02-21 DEATH — deceased

## 2021-03-14 ENCOUNTER — Ambulatory Visit: Payer: Medicare Other | Admitting: Cardiovascular Disease

## 2021-03-28 NOTE — Telephone Encounter (Signed)
error 

## 2021-05-12 ENCOUNTER — Other Ambulatory Visit: Payer: Medicare Other

## 2021-05-12 ENCOUNTER — Ambulatory Visit: Payer: Medicare Other | Admitting: Hematology and Oncology

## 2021-06-12 IMAGING — CR DG WRIST COMPLETE 3+V*L*
4 series · 4 of 4 positions shown · non-contrast
Comparison: None.

CLINICAL DATA: Fall.  Left wrist pain and swelling.  Deformity.

EXAM:
LEFT WRIST - COMPLETE 3+ VIEW

[x wrist pa left]
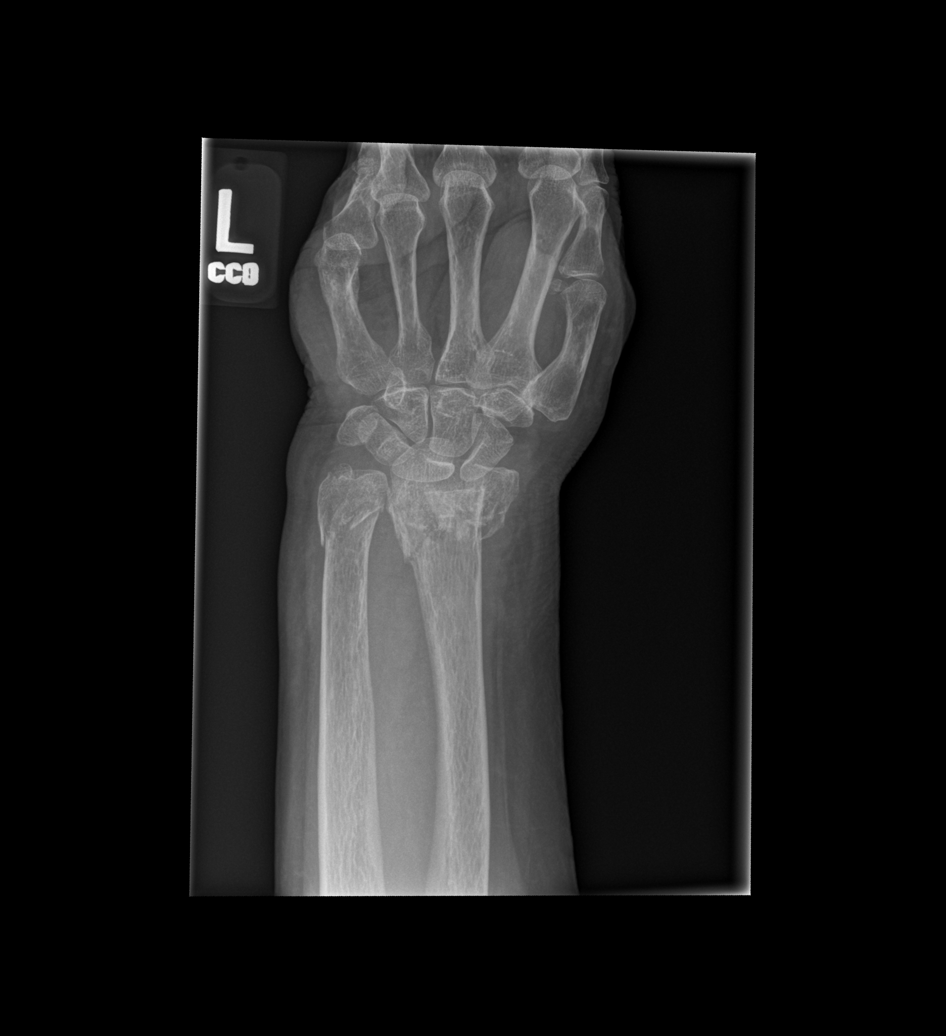

[x wrist obl left (1 of 2)]
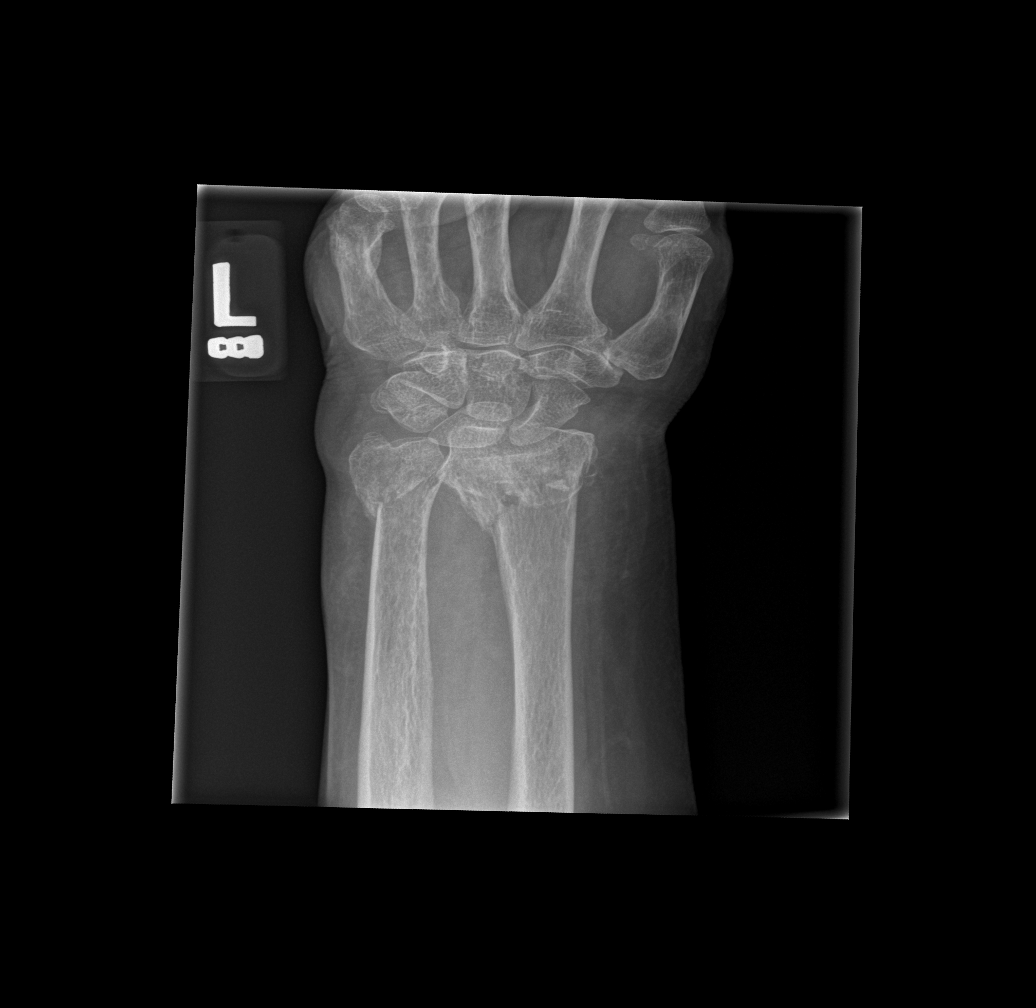

[x wrist obl left (2 of 2)]
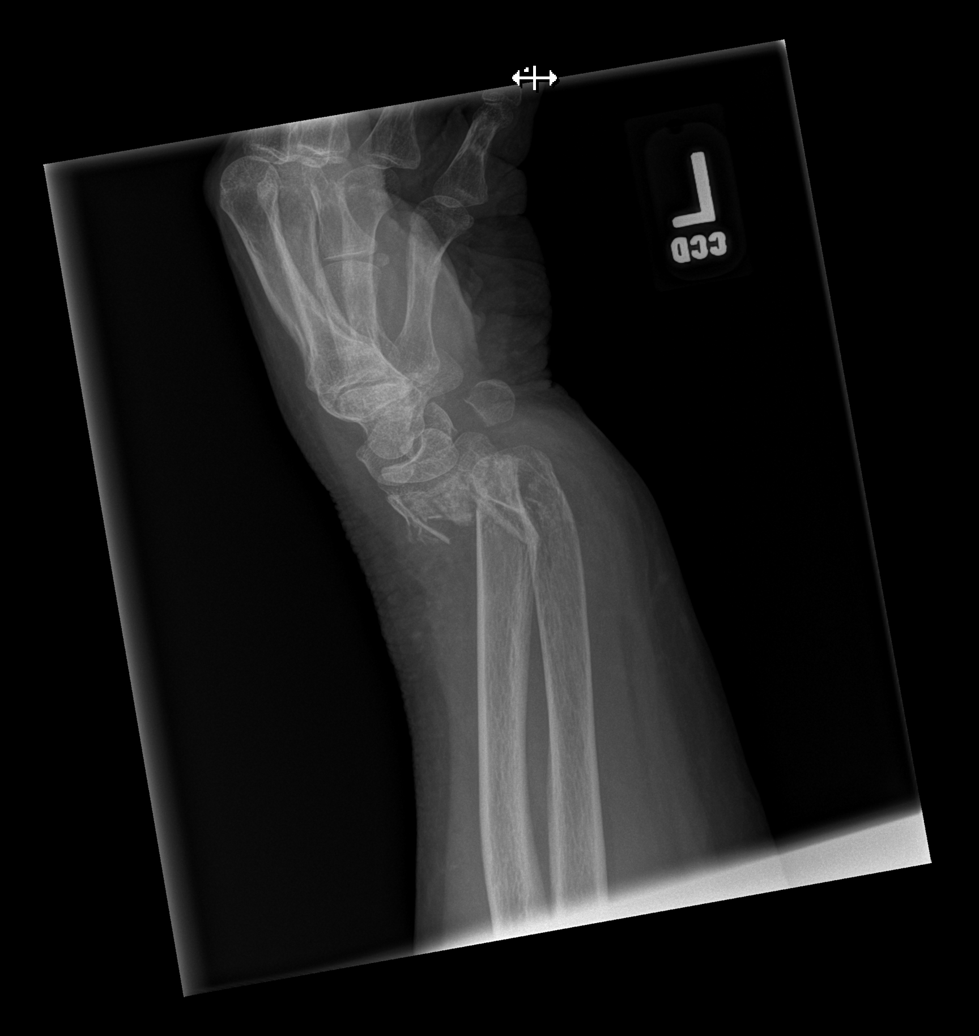

[x wrist navicular view left]
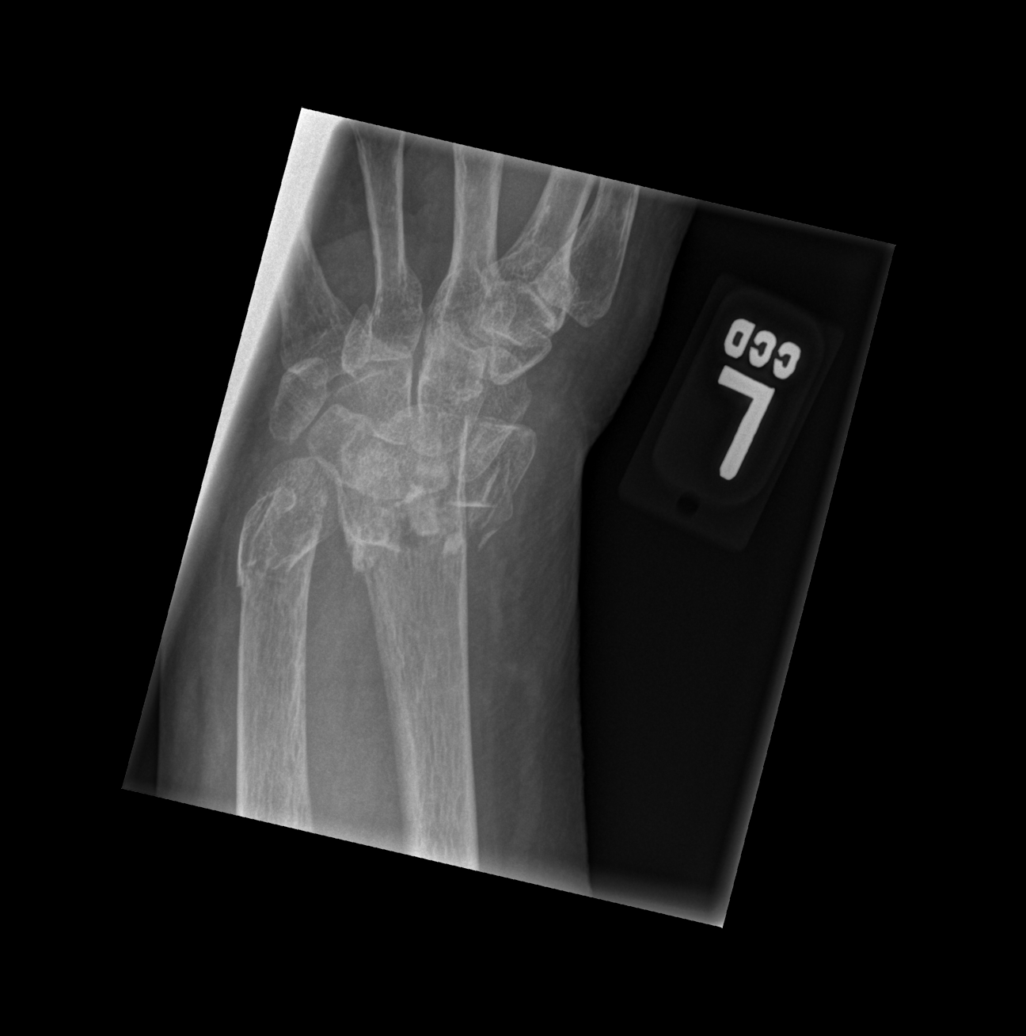

[4 of 4 positions shown; findings below may reference images not displayed]

FINDINGS: There is a comminuted, impacted and dorsally displaced fracture of
the distal radius, with dorsal displacement approximately 1.5 cm.
Apparent intra-articular component of the fracture. The dorsal
articular surface is also angulated dorsally approximately 20
degrees.

There is an associated fracture of the distal ulna, mildly
comminuted, also somewhat impacted and dorsally angulated.

The carpus displaces with the distal radial fracture component.
There is no dislocation.

No other fractures.

Skeletal structures are demineralized.

There is surrounding soft tissue swelling.
IMPRESSION: 1. Comminuted, dorsally displaced and dorsally angulated fracture of
the distal left radius with an intra-articular component, associated
with a mildly comminuted, dorsally angulated fracture of the distal
ulna. No dislocation.

## 2021-06-12 IMAGING — CR DG HIP (WITH OR WITHOUT PELVIS) 2-3V*L*
4 series · 4 of 4 positions shown · non-contrast
Comparison: None.

CLINICAL DATA: Fall.  Left hip pain.

EXAM:
DG HIP (WITH OR WITHOUT PELVIS) 2-3V LEFT

[x pelvis]
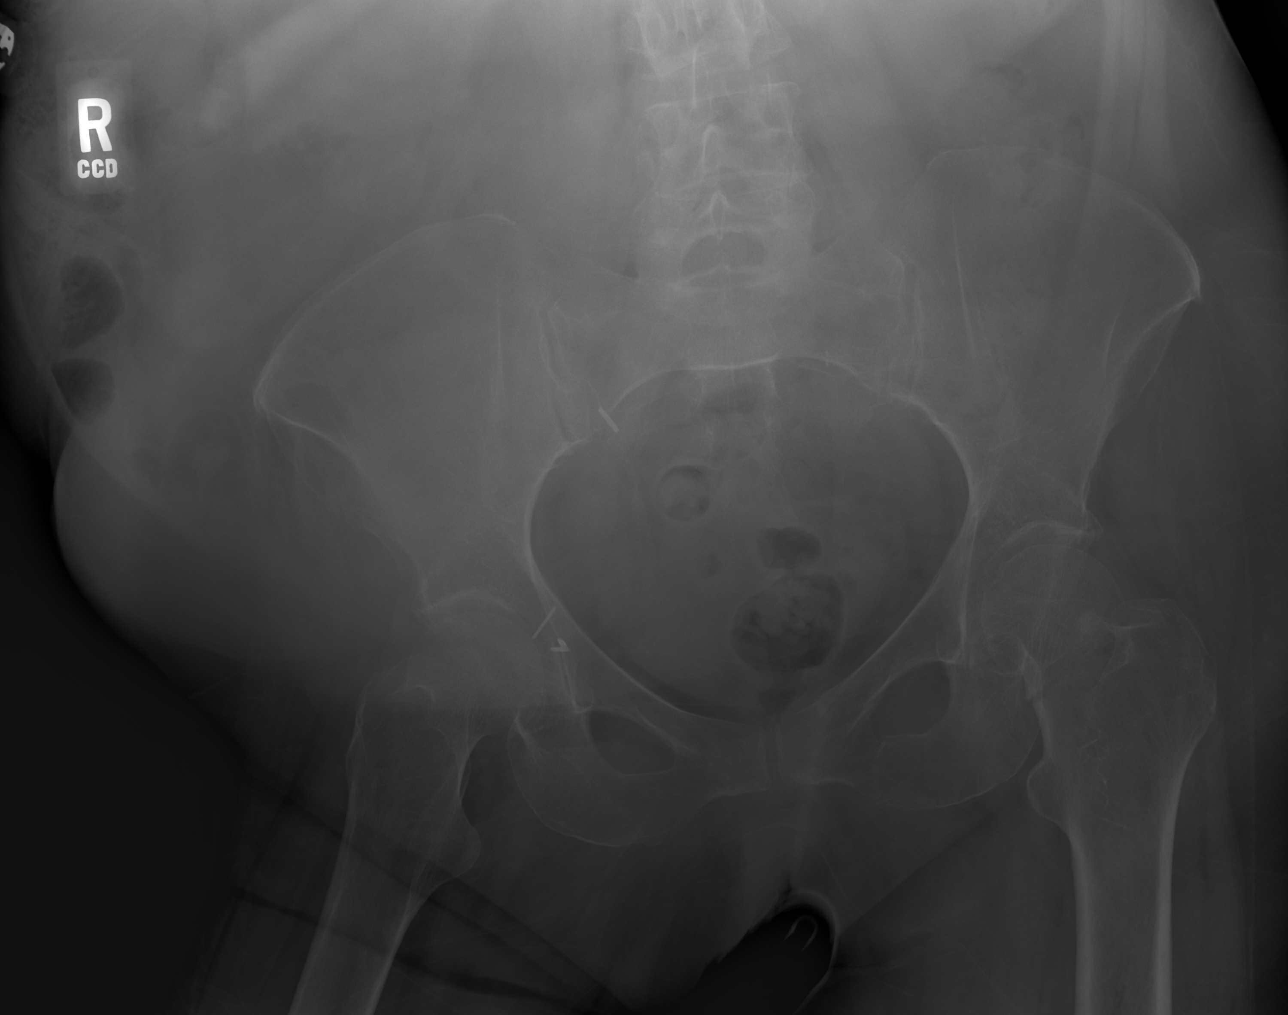

[x hip ap left]
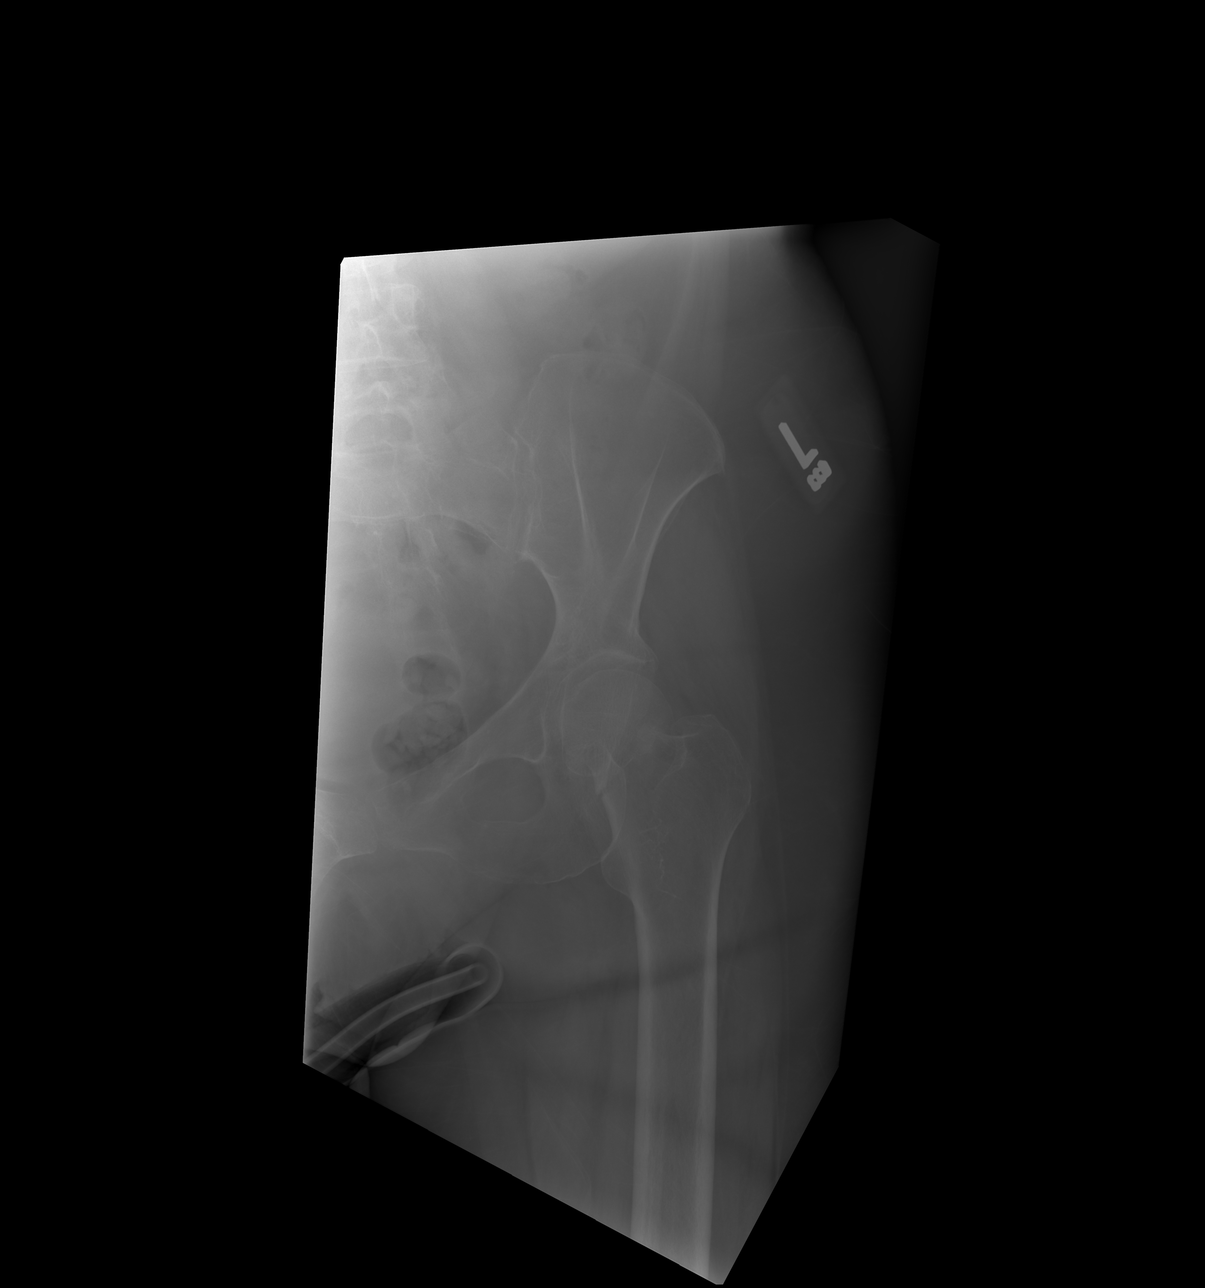

[w hip lat left (1 of 2)]
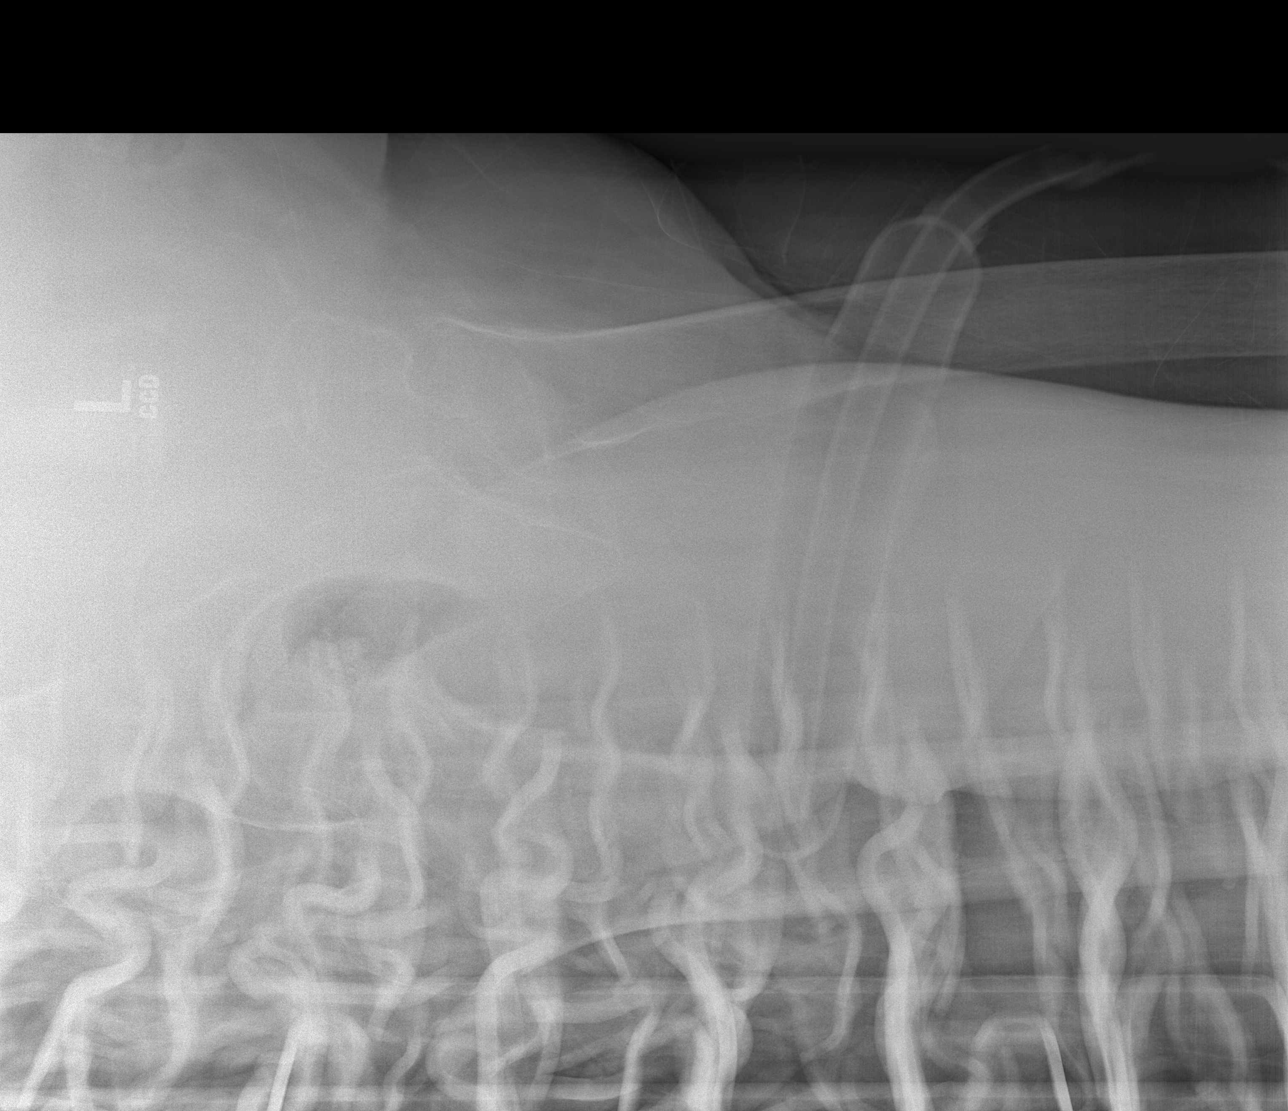

[w hip lat left (2 of 2)]
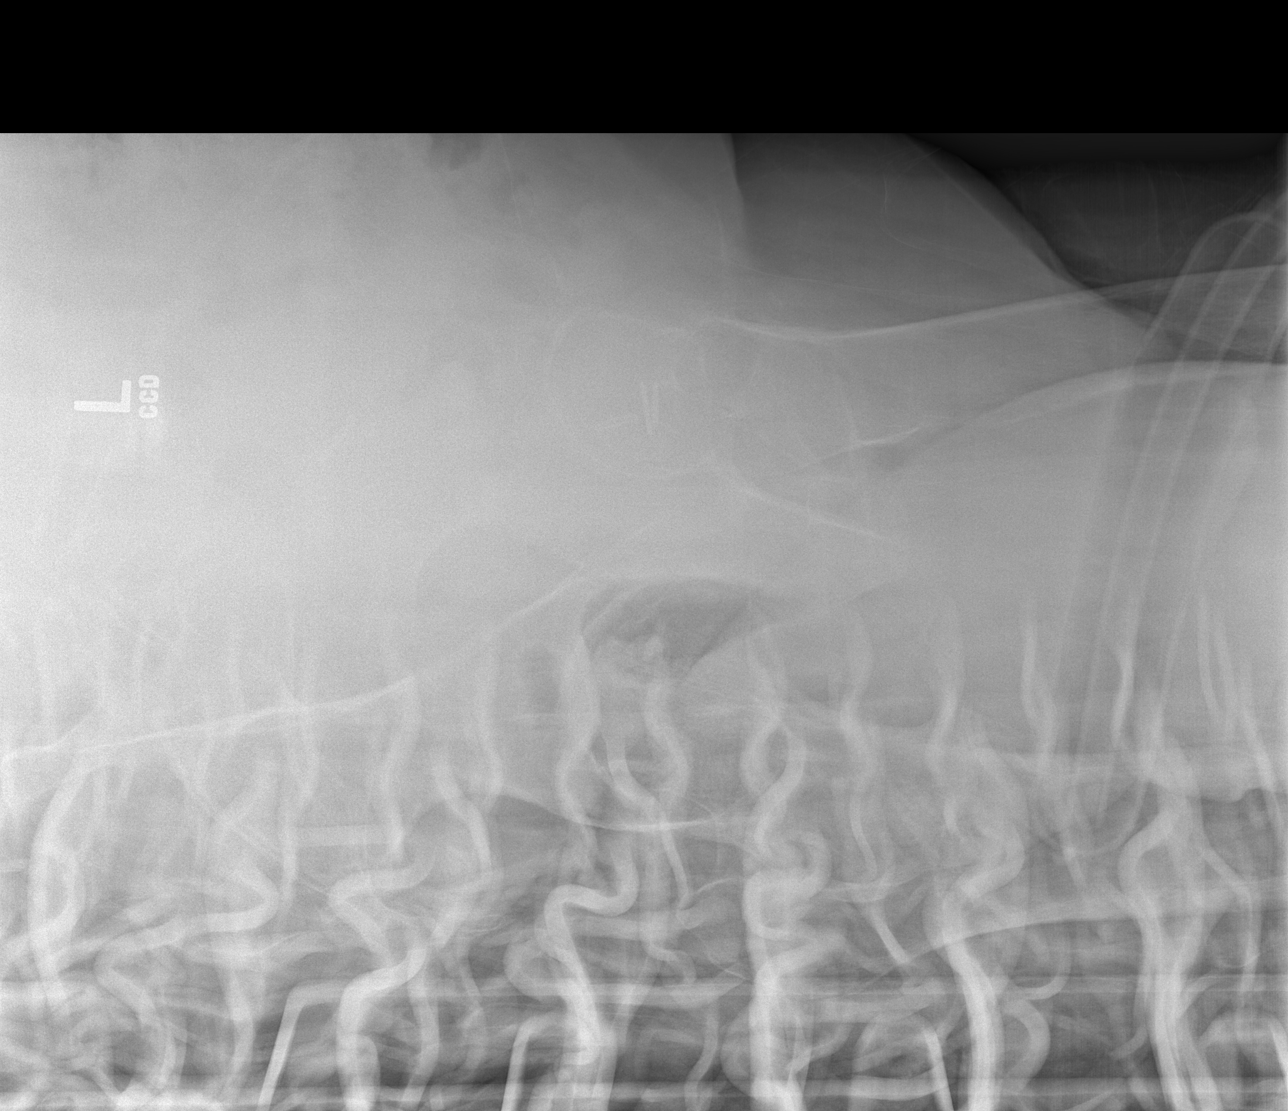

[4 of 4 positions shown; findings below may reference images not displayed]

FINDINGS: Nondisplaced, non comminuted fracture across the mid left femoral
neck. Mild valgus angulation.

No other fractures.

Hip joints, SI joints and symphysis pubis are normally aligned.

Skeletal structures are demineralized.

Soft tissues are unremarkable.
IMPRESSION: 1. Nondisplaced, non comminuted fracture of the left mid femoral
neck with mild valgus angulation. No dislocation.

## 2021-06-12 IMAGING — DX DG WRIST 2V*L*
2 series · 2 of 2 positions shown · non-contrast
Comparison: 12/06/2018

CLINICAL DATA: Post reduction

EXAM:
LEFT WRIST - 2 VIEW

[wrist ap]
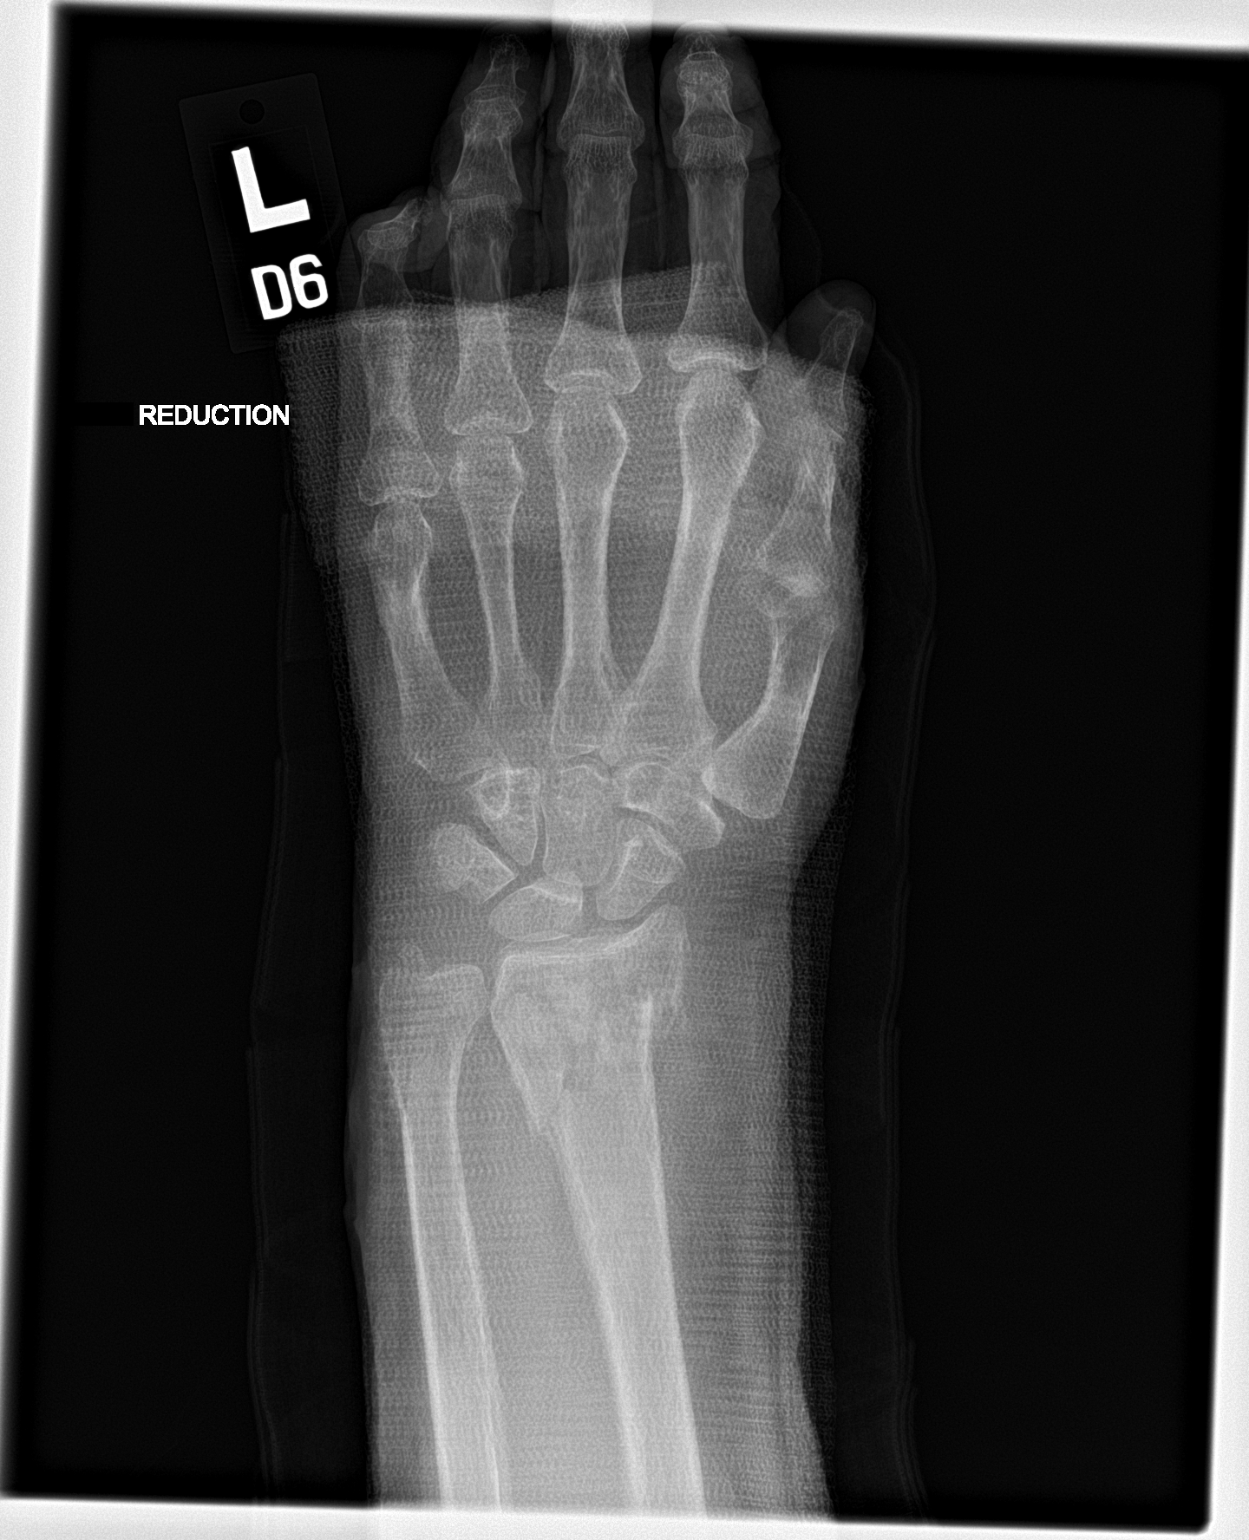

[wrist lat]
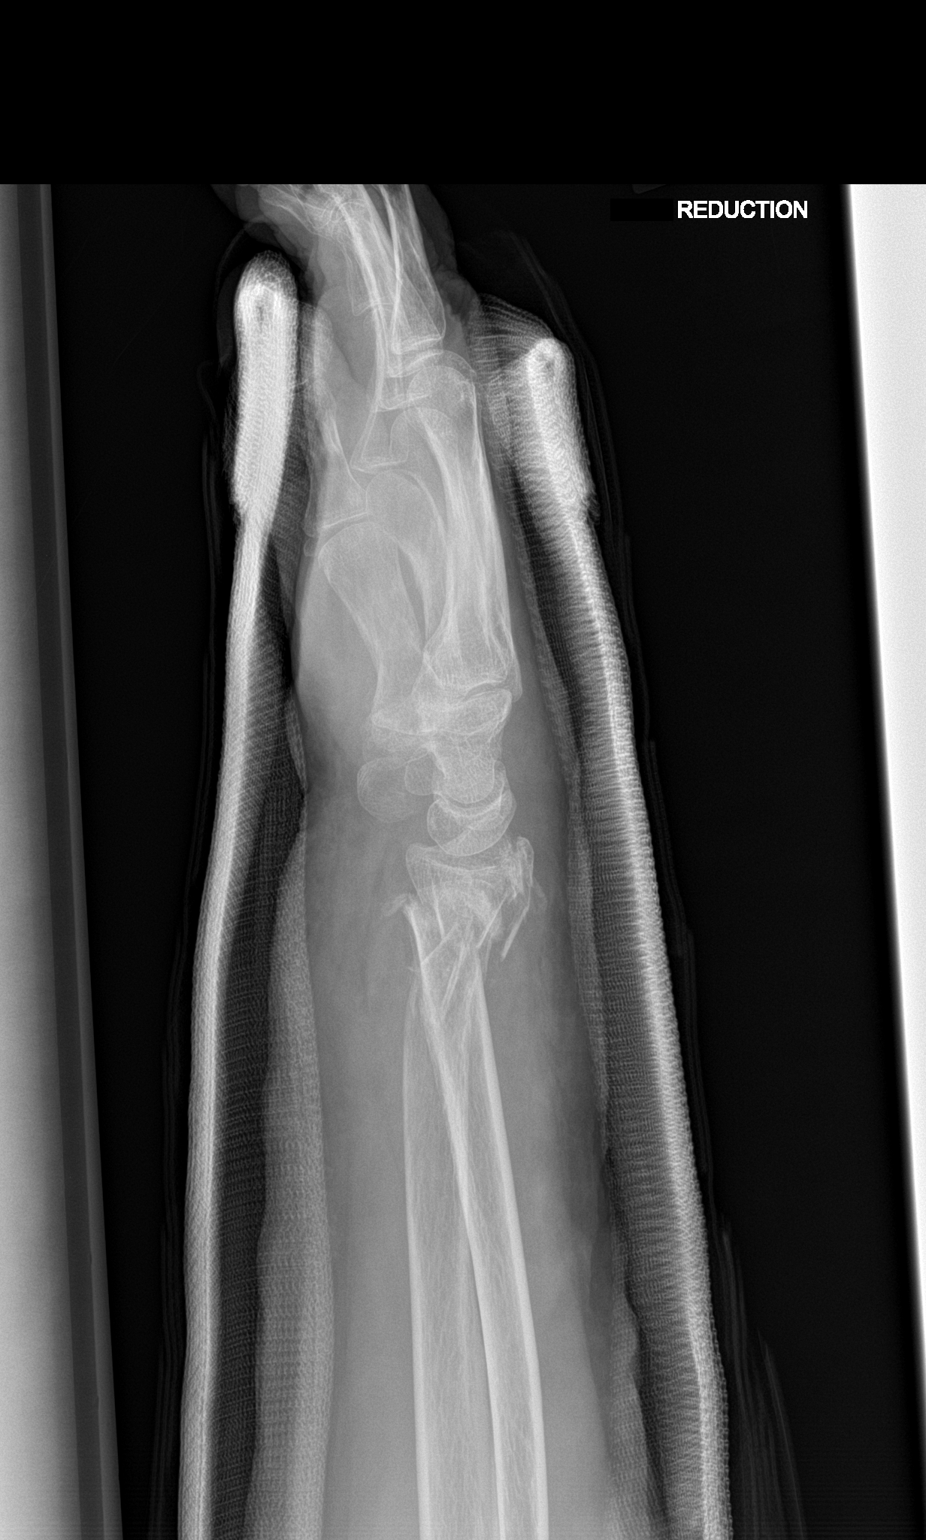

[2 of 2 positions shown; findings below may reference images not displayed]

FINDINGS: Interval casting material limits bone detail. Acute markedly
comminuted intra-articular distal radius fracture with residual [DATE]
shaft diameter of dorsal displacement of distal fracture fragment,
though improved alignment compared to previous. Decreased angulation
and impaction. Acute comminuted distal ulna fracture, also with
decreased angulation and impaction.
IMPRESSION: Acute comminuted intra-articular distal radius fracture with
decreased impaction and displacement. Acute comminuted distal ulna
fracture, also with decreased impaction and angulation

## 2021-06-13 IMAGING — DX DG PORTABLE PELVIS
1 series · 1 of 1 positions shown · non-contrast
Comparison: 12/07/2019

CLINICAL DATA: Status post hip replacement

EXAM:
PORTABLE PELVIS 1-2 VIEWS

[pelvis ap]
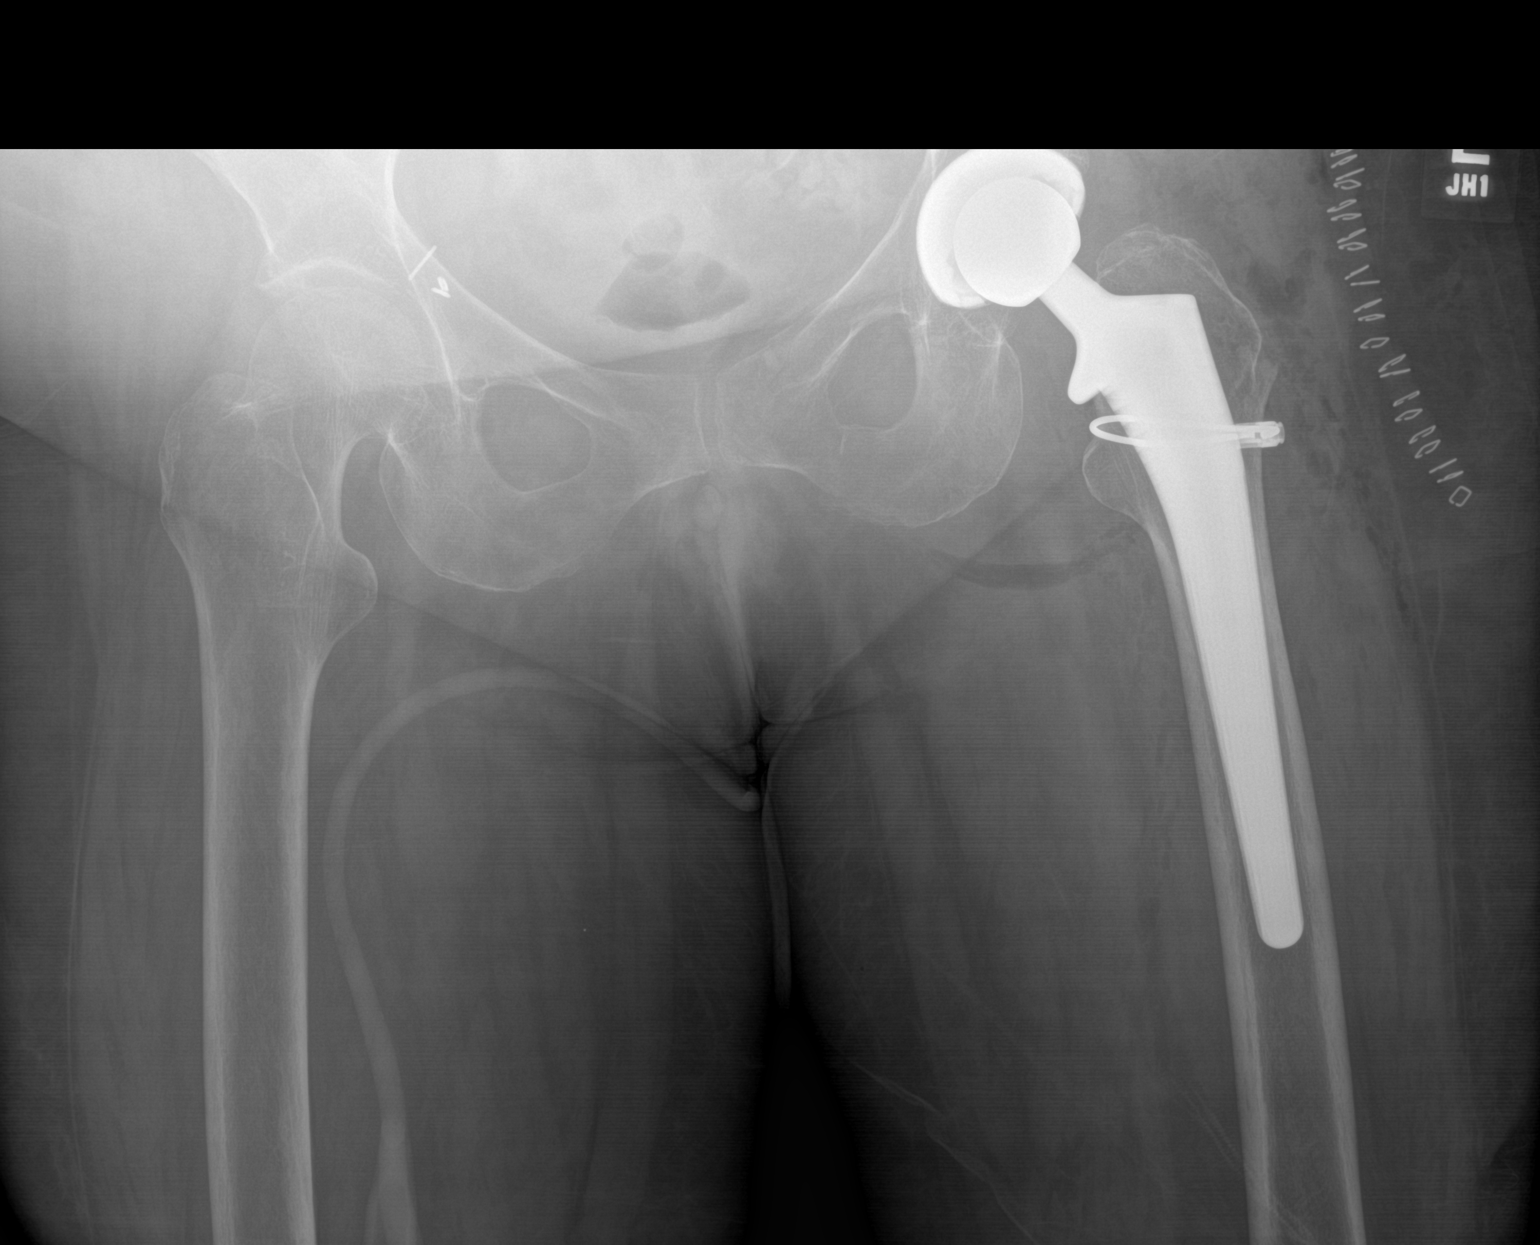

[1 of 1 positions shown; findings below may reference images not displayed]

FINDINGS: Interval left hip replacement with normal alignment. Apparent
cortical fracture deformity at the inferior aspect of the greater
trochanter. Gas in the soft tissues along with cutaneous staples.
Possible acute left superior pubic ramus fracture.
IMPRESSION: 1. Interval left hip replacement with intact hardware and normal
alignment. Expected postsurgical changes overlying left hip.
2. Cortex deformity at the inferior aspect of the greater trochanter
on the left, suspect for fracture. Possible acute left superior
pubic ramus fracture as well.

## 2021-06-13 IMAGING — RF DG HIP (WITH PELVIS) OPERATIVE*L*
1 series · 2 of 2 positions shown · non-contrast
Comparison: 12/07/2019.

CLINICAL DATA: Total left hip replacement.

EXAM:
OPERATIVE LEFT HIP (WITH PELVIS IF PERFORMED) 2 VIEWS
TECHNIQUE: Fluoroscopic spot image(s) were submitted for interpretation
post-operatively.

[Series 1: unknown protocol · 0.20mm/px · 2 of 2 slices shown]
[im 1/2]
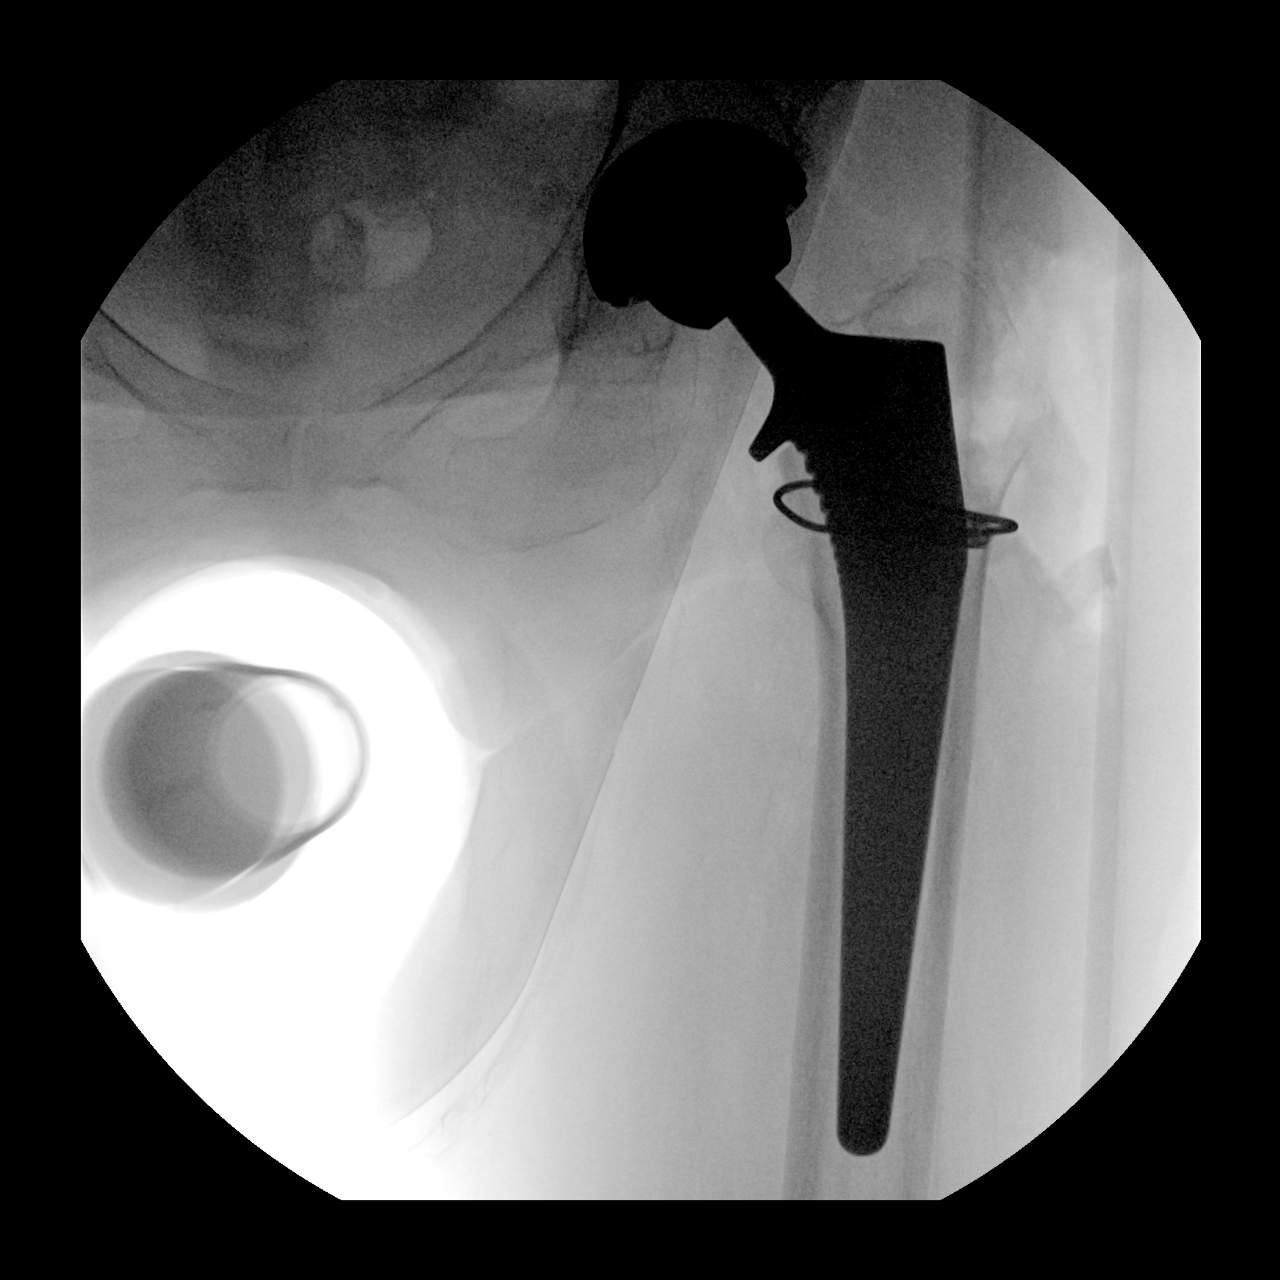
[im 2/2]
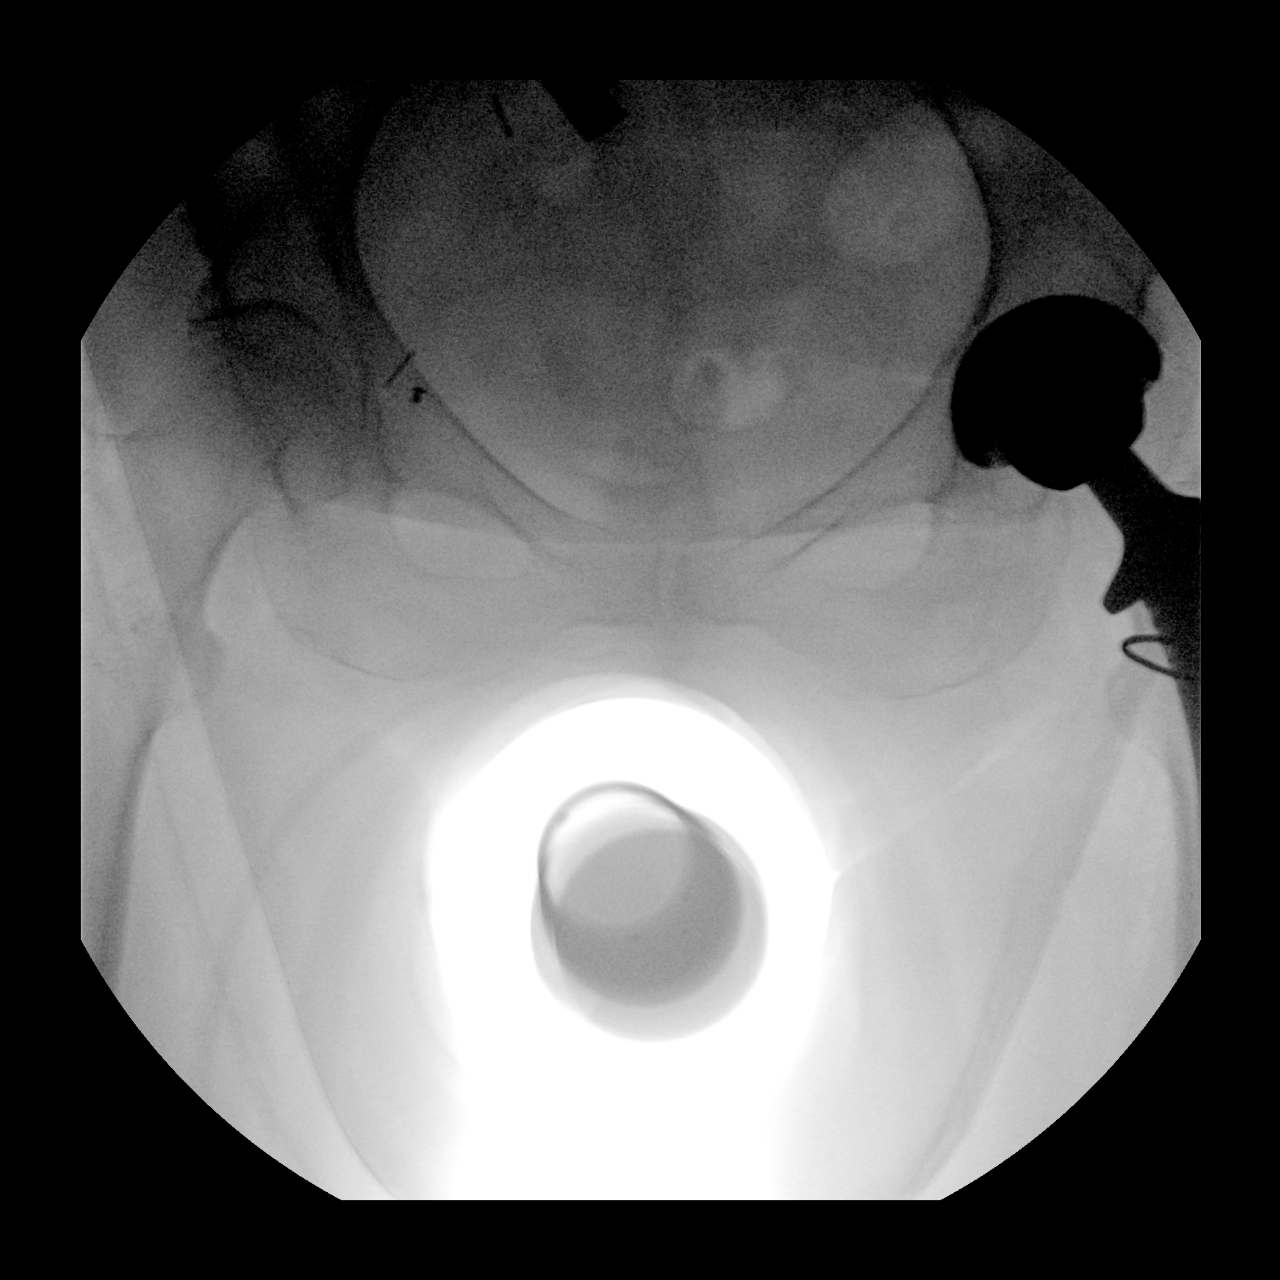

[2 of 2 positions shown; findings below may reference images not displayed]

FINDINGS: Total left hip replacement. Hardware intact. Anatomic alignment.
Pelvic calcifications consistent phleboliths.
IMPRESSION: Total left hip replacement with anatomic alignment.

## 2021-06-24 IMAGING — CR DG PELVIS 1-2V
1 series · 1 of 1 positions shown · non-contrast
Comparison: 12/08/2019

CLINICAL DATA: Low back pain, fracture of left superior pubic ramus

EXAM:
PELVIS - 1-2 VIEW

[pelvis ap]
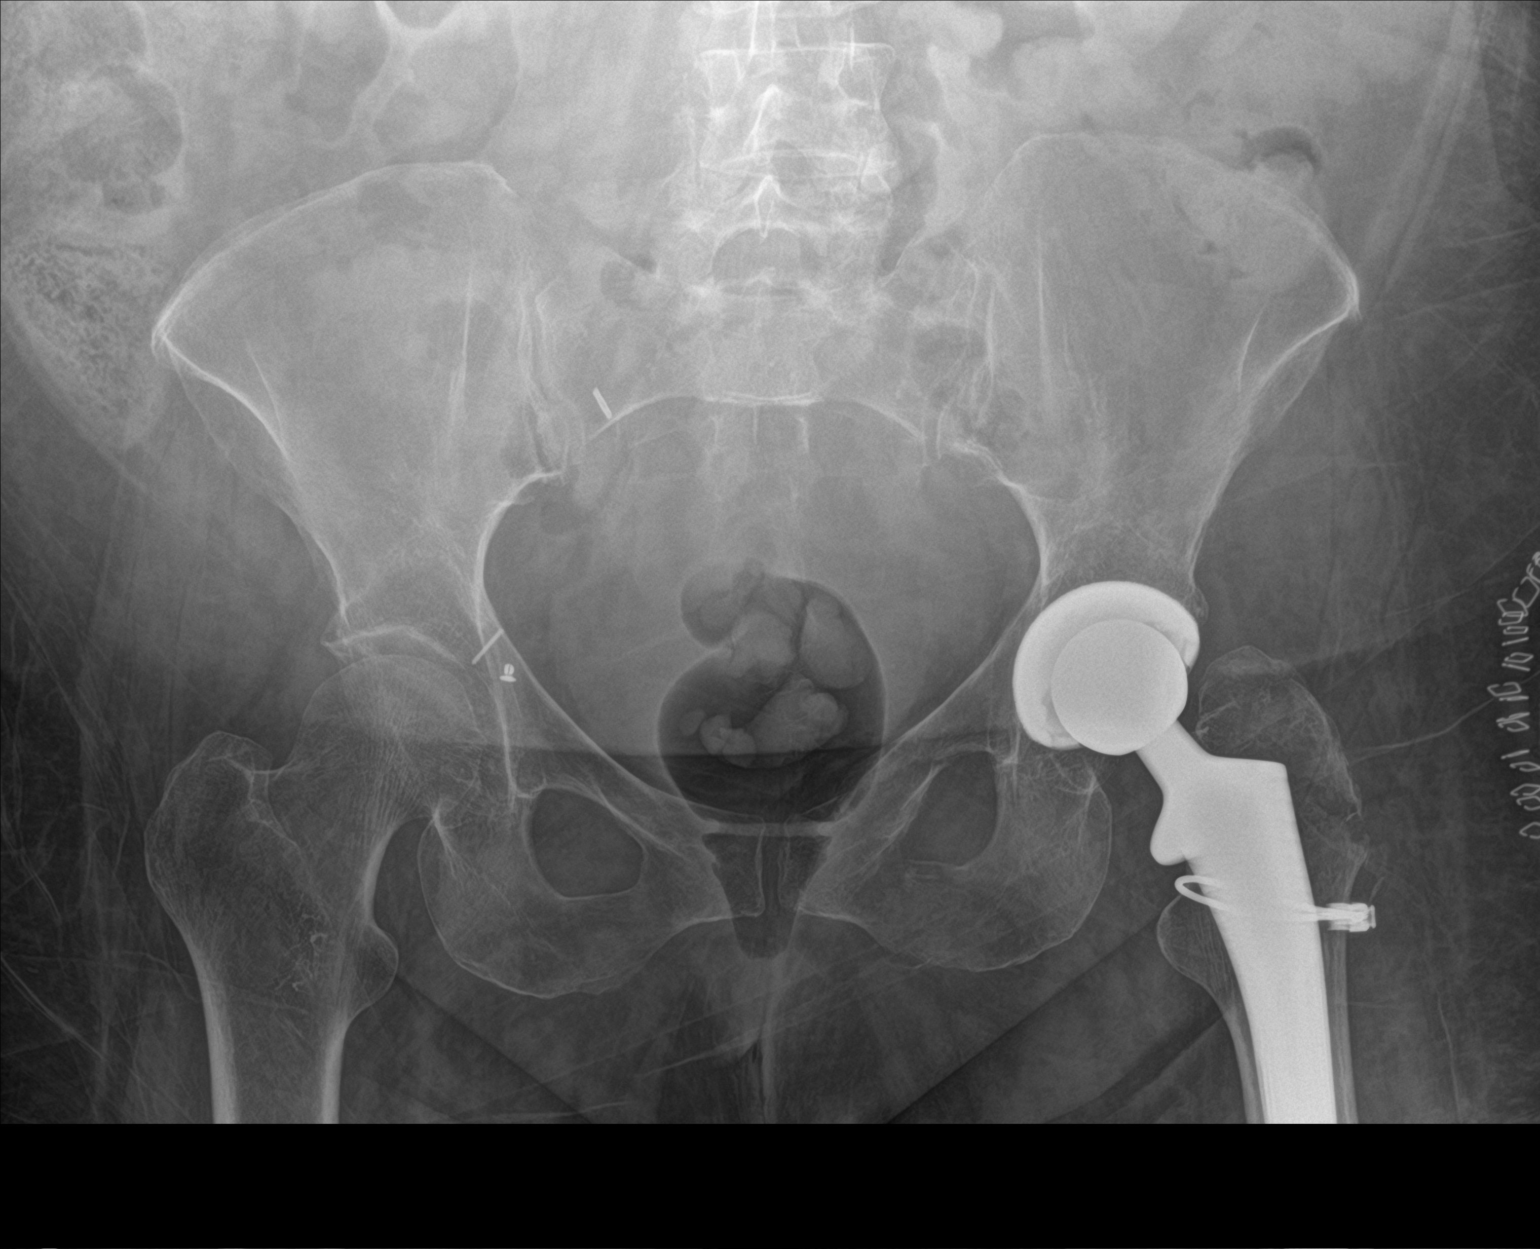

[1 of 1 positions shown; findings below may reference images not displayed]

FINDINGS: Generalized osteopenia. Non displaced fracture of the left superior
pubic ramus. Left hip arthroplasty. No pelvic bone lesions are seen.
IMPRESSION: Nondisplaced fracture of the left superior pubic ramus.

## 2021-06-25 IMAGING — CT CT PELVIS W/O CM
2 of 5 series · 15 of 46 positions shown, 17 images · non-contrast
Comparison: 03/31/2019

CLINICAL DATA: Follow-up x-ray to evaluate possible pelvic
fracture.

EXAM:
CT PELVIS WITHOUT CONTRAST
TECHNIQUE: Multidetector CT imaging of the pelvis was performed following the
standard protocol without intravenous contrast.

[Series 3: pelvis w/o 5.0 (person_name) · axial · non-contrast · 0.98mm/px · z∈[-1195,-990]mm · 12 of 49 slices shown, 14 images]
[im 4/49  soft-tissue]
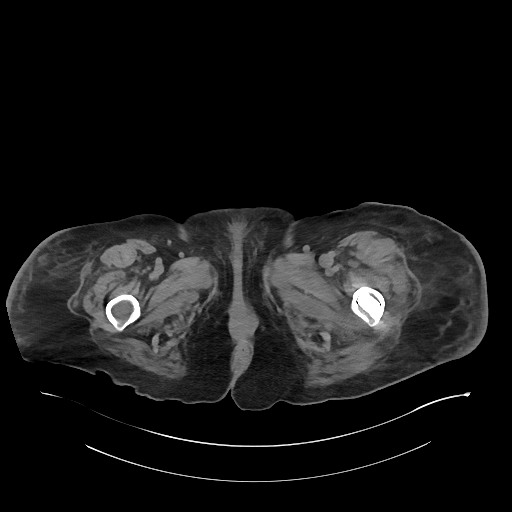
[im 4/49  bone]
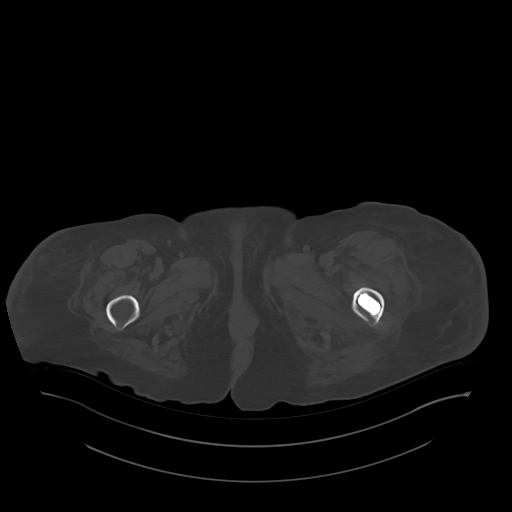
[im 7/49  soft-tissue]
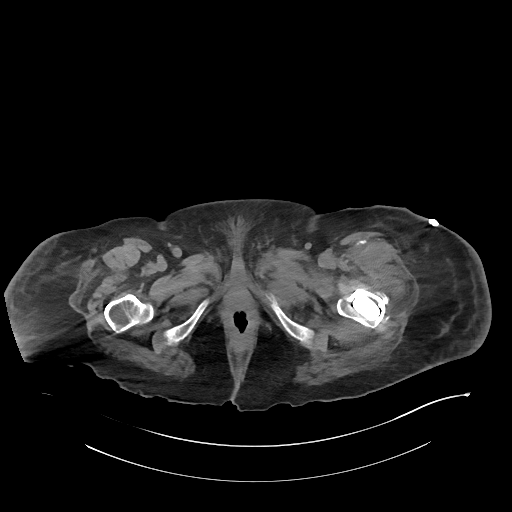
[im 11/49  soft-tissue]
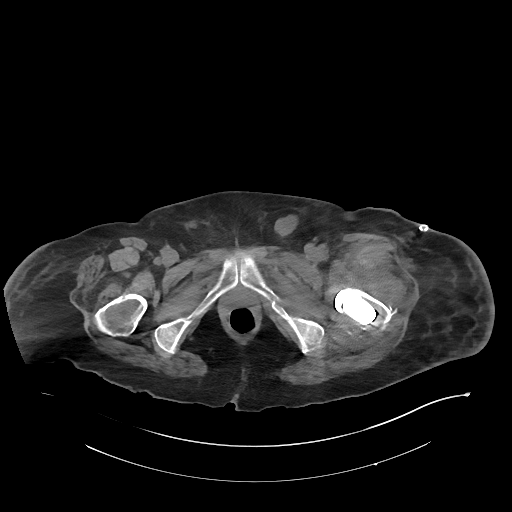
[im 14/49  soft-tissue]
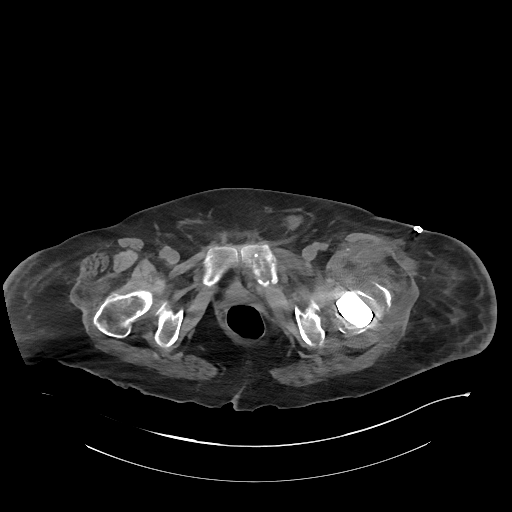
[im 19/49  soft-tissue]
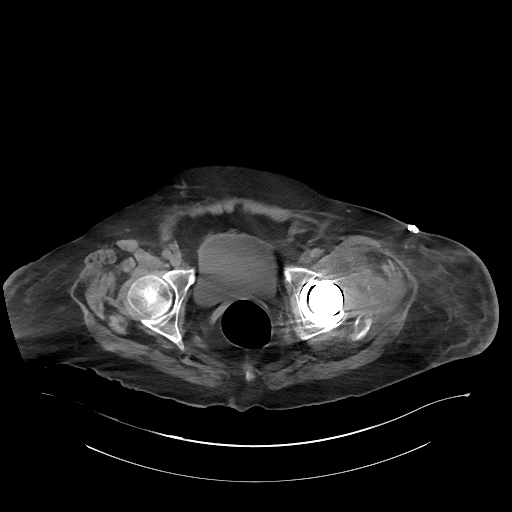
[im 22/49  soft-tissue]
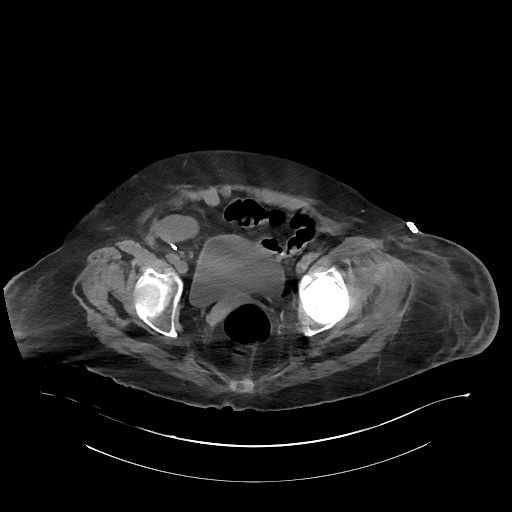
[im 27/49  soft-tissue]
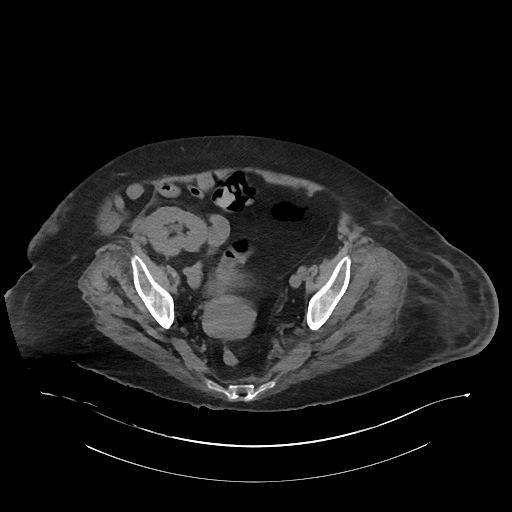
[im 30/49  soft-tissue]
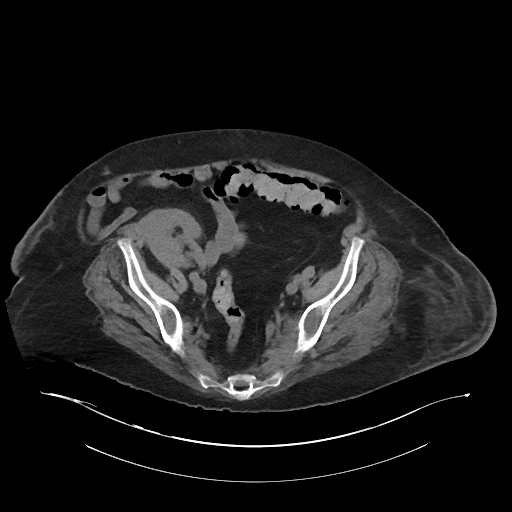
[im 35/49  soft-tissue]
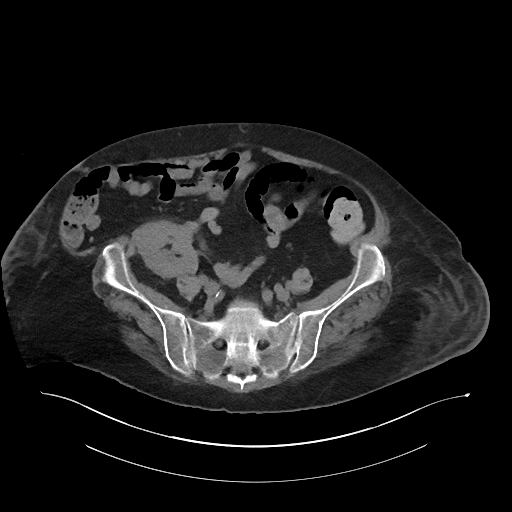
[im 35/49  bone]
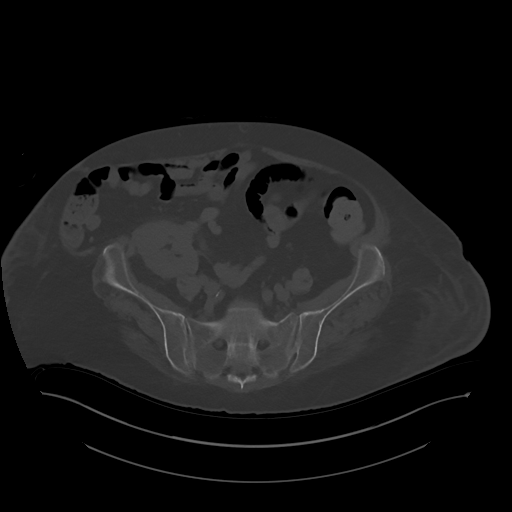
[im 38/49  soft-tissue]
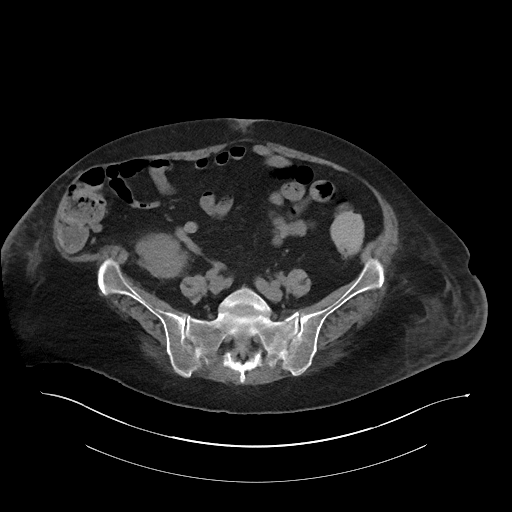
[im 42/49  soft-tissue]
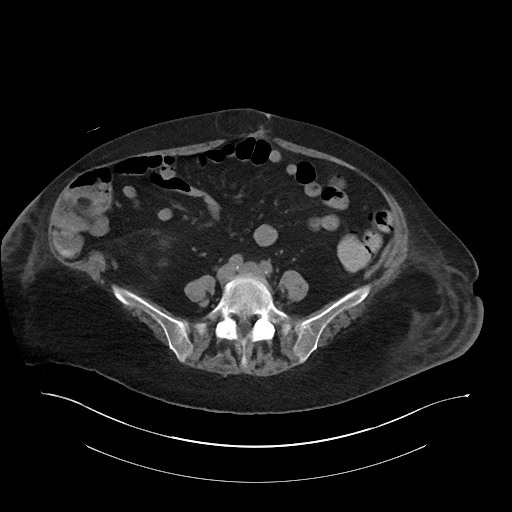
[im 45/49  soft-tissue]
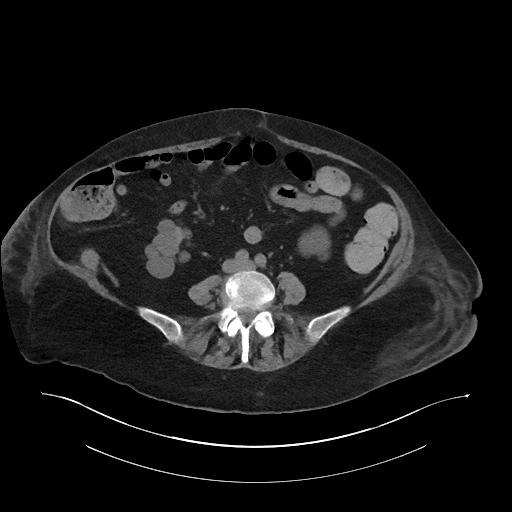

[Series 5: pelvis w/o 2.0 cor · coronal · non-contrast · 0.48mm/px · 3 of 151 slices shown]
[im 51/151  soft-tissue]
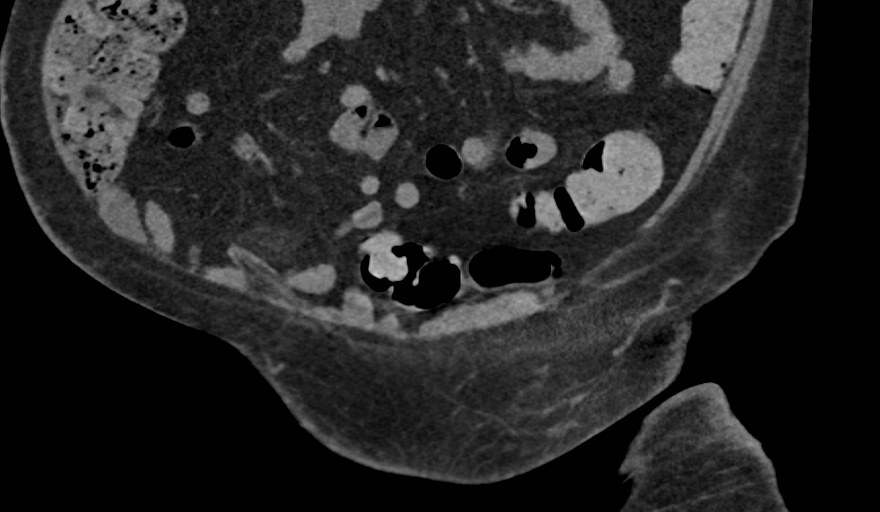
[im 76/151  soft-tissue]
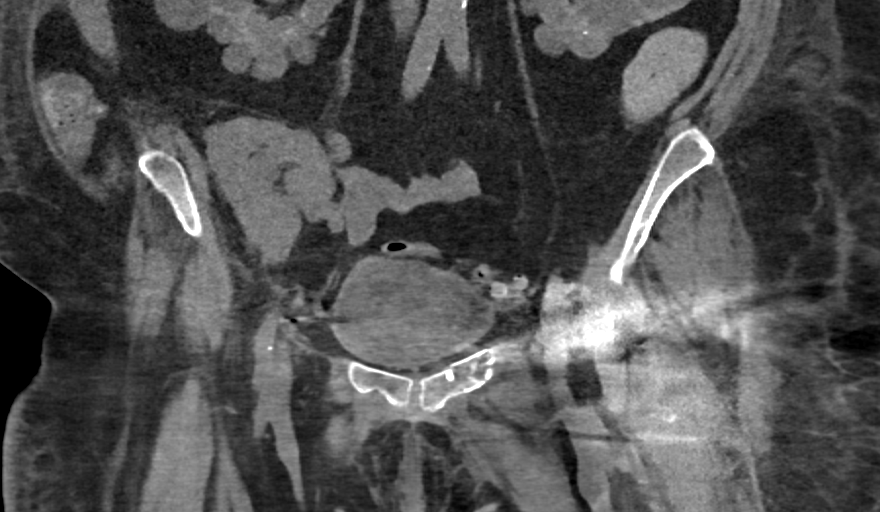
[im 101/151  soft-tissue]
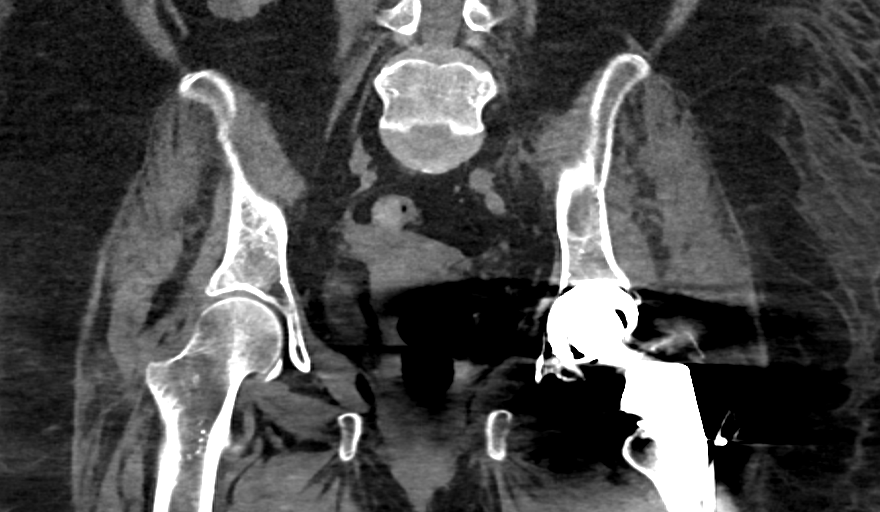

[15 of 46 positions shown; findings below may reference images not displayed]

FINDINGS: Urinary Tract: Partially visualized multi-cystic kidneys likely
autosomal dominant polycystic kidney disease. Transplant kidney over
the right lower quadrant/upper pelvis unchanged. Bladder is normal.

Bowel: Visualized large and small bowel are normal. Appendix is
normal.

Vascular/Lymphatic: Normal.

Reproductive:  Normal.

Other: Small umbilical hernia containing only peritoneal fat
unchanged.

Musculoskeletal: Evidence of patient's recent left hip arthroplasty
intact. Subtle fracture involving the left side of the sacrum
adjacent the sacroiliac joint. There is a mildly displaced acute
fracture of the midportion of the left inferior pubic ramus. There
is also a minimally displaced fracture of the left superior pubic
ramus extending towards the symphysis. Fragmentation of the left
greater trochanter which may be part patient's recent acute left hip
fracture versus acute re-injury. Soft tissue swelling over the left
hip.
IMPRESSION: 1. Minimally displaced acute fractures of the left superior and
inferior pubic rami.

2. Acute minimally displaced fracture of the left sacrum adjacent
the sacroiliac joint.

3. Fragmentation of the left greater trochanter which may be part of
patient's known recent acute left femoral neck fracture versus acute
re-injury. Recent left hip arthroplasty otherwise intact.

## 2021-07-11 IMAGING — DX DG WRIST 2V*L*
2 series · 2 of 2 positions shown · non-contrast
Comparison: 12/07/2019

CLINICAL DATA: Follow-up wrist fracture.

EXAM:
LEFT WRIST - 2 VIEW

[wrist pa]
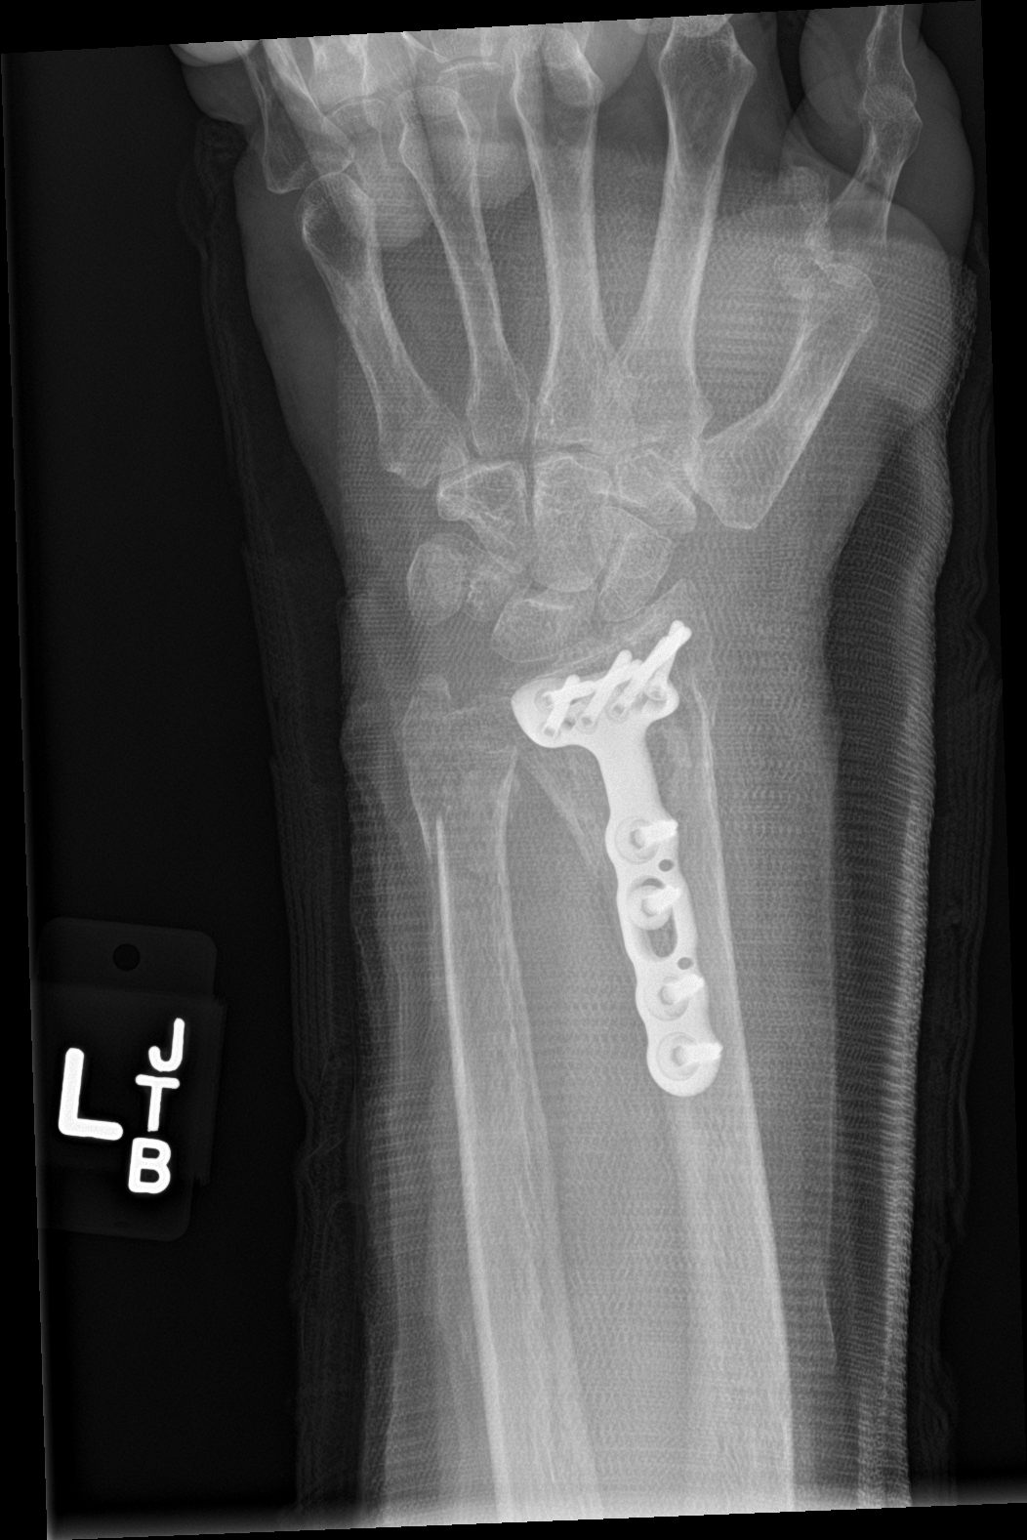

[wrist lat]
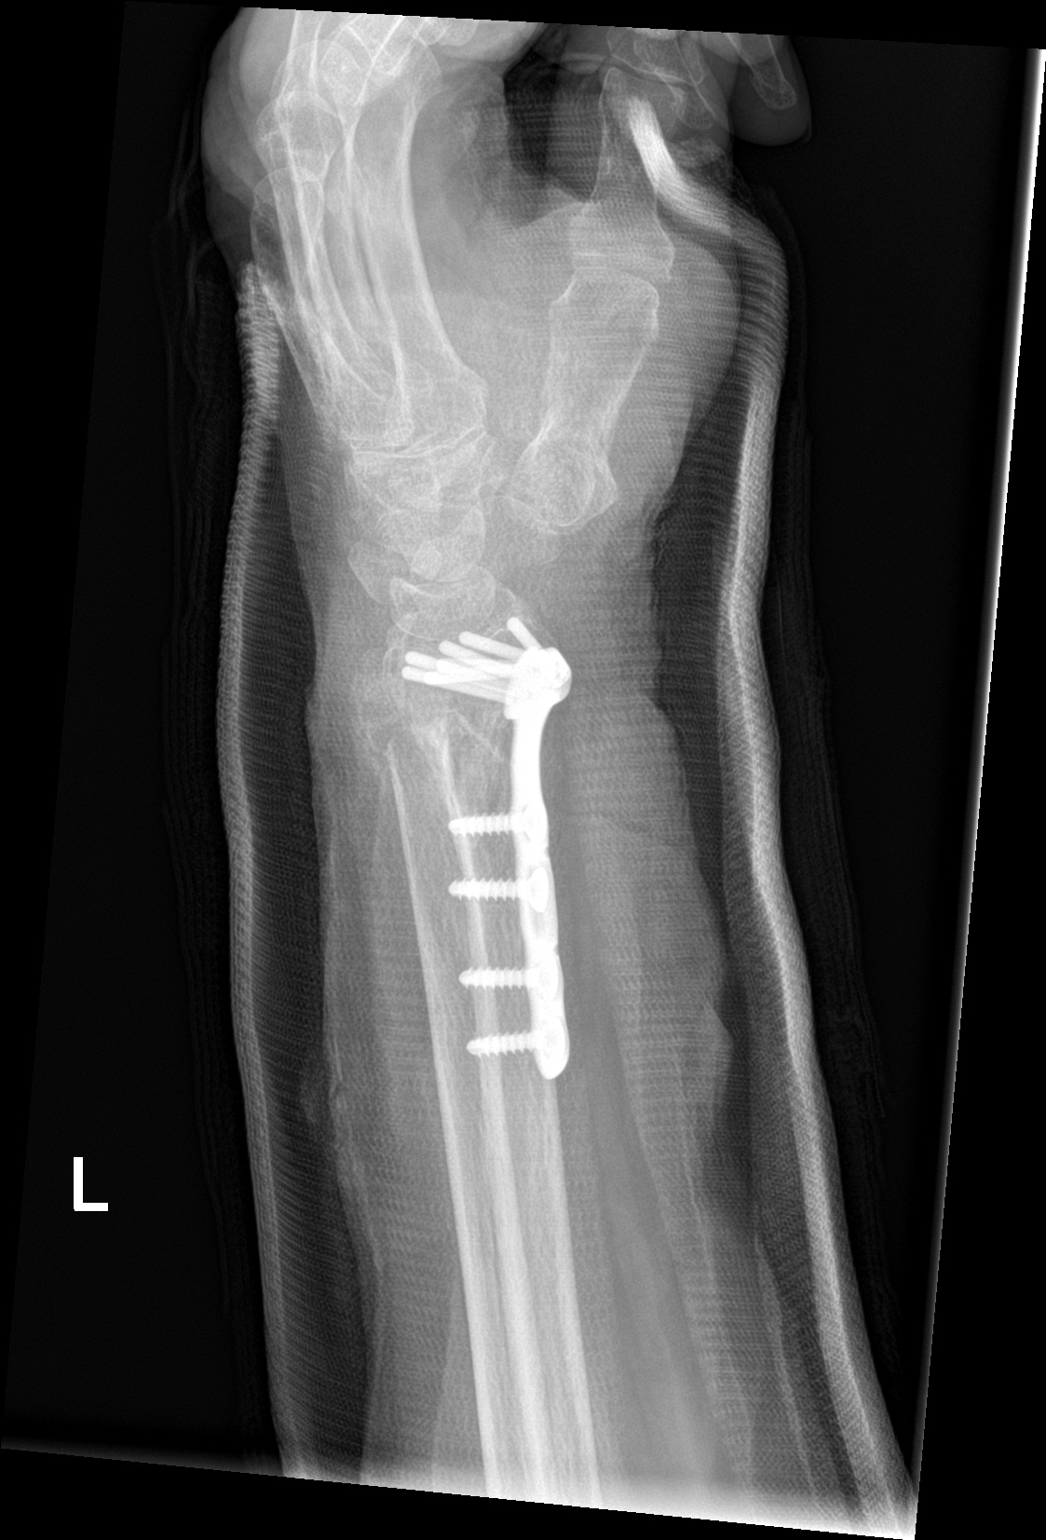

[2 of 2 positions shown; findings below may reference images not displayed]

FINDINGS: There is a volar plate and multiple screws transfixing the complex
comminuted distal radius fracture. Good position and alignment
without complicating features. The distal ulnar shaft fracture and
ulnar styloid avulsion fracture are in good position without
complicating features.

The carpal and metacarpal bones appear normal and stable.
IMPRESSION: Good position and alignment of the distal radius and ulnar fractures
with fixating hardware on the radius. No complicating features.

## 2021-07-13 IMAGING — DX DG CHEST 1V PORT
1 series · 1 of 1 positions shown · non-contrast
Comparison: 12/09/2019

CLINICAL DATA: Shortness of breath for 2 days

EXAM:
PORTABLE CHEST 1 VIEW

[chest ap]
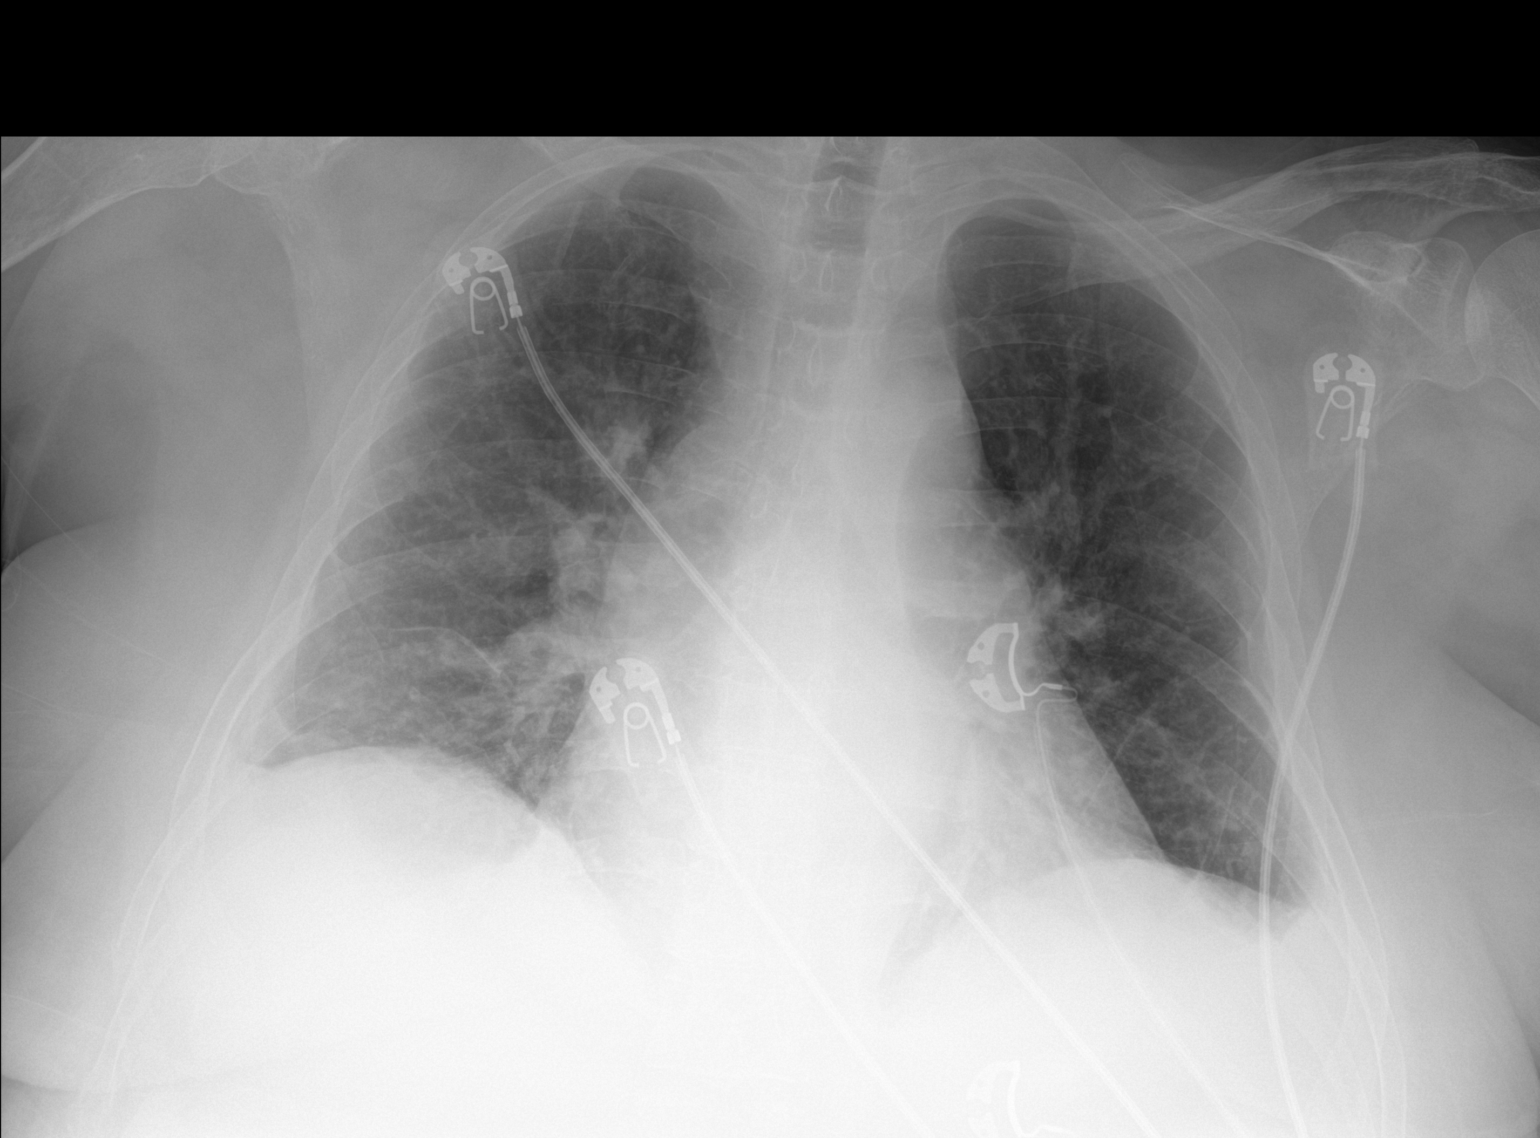

[1 of 1 positions shown; findings below may reference images not displayed]

FINDINGS: Cardiac shadow is mildly enlarged but stable. The lungs are well
aerated bilaterally. Minimal blunting of the costophrenic angles is
noted bilaterally. No acute abnormality is noted.
IMPRESSION: No acute abnormality seen.

## 2021-08-06 IMAGING — DX DG CHEST 1V PORT
1 series · 1 of 1 positions shown · non-contrast
Comparison: January 07, 2020.

CLINICAL DATA: Dyspnea.

EXAM:
PORTABLE CHEST 1 VIEW

[chest ap]
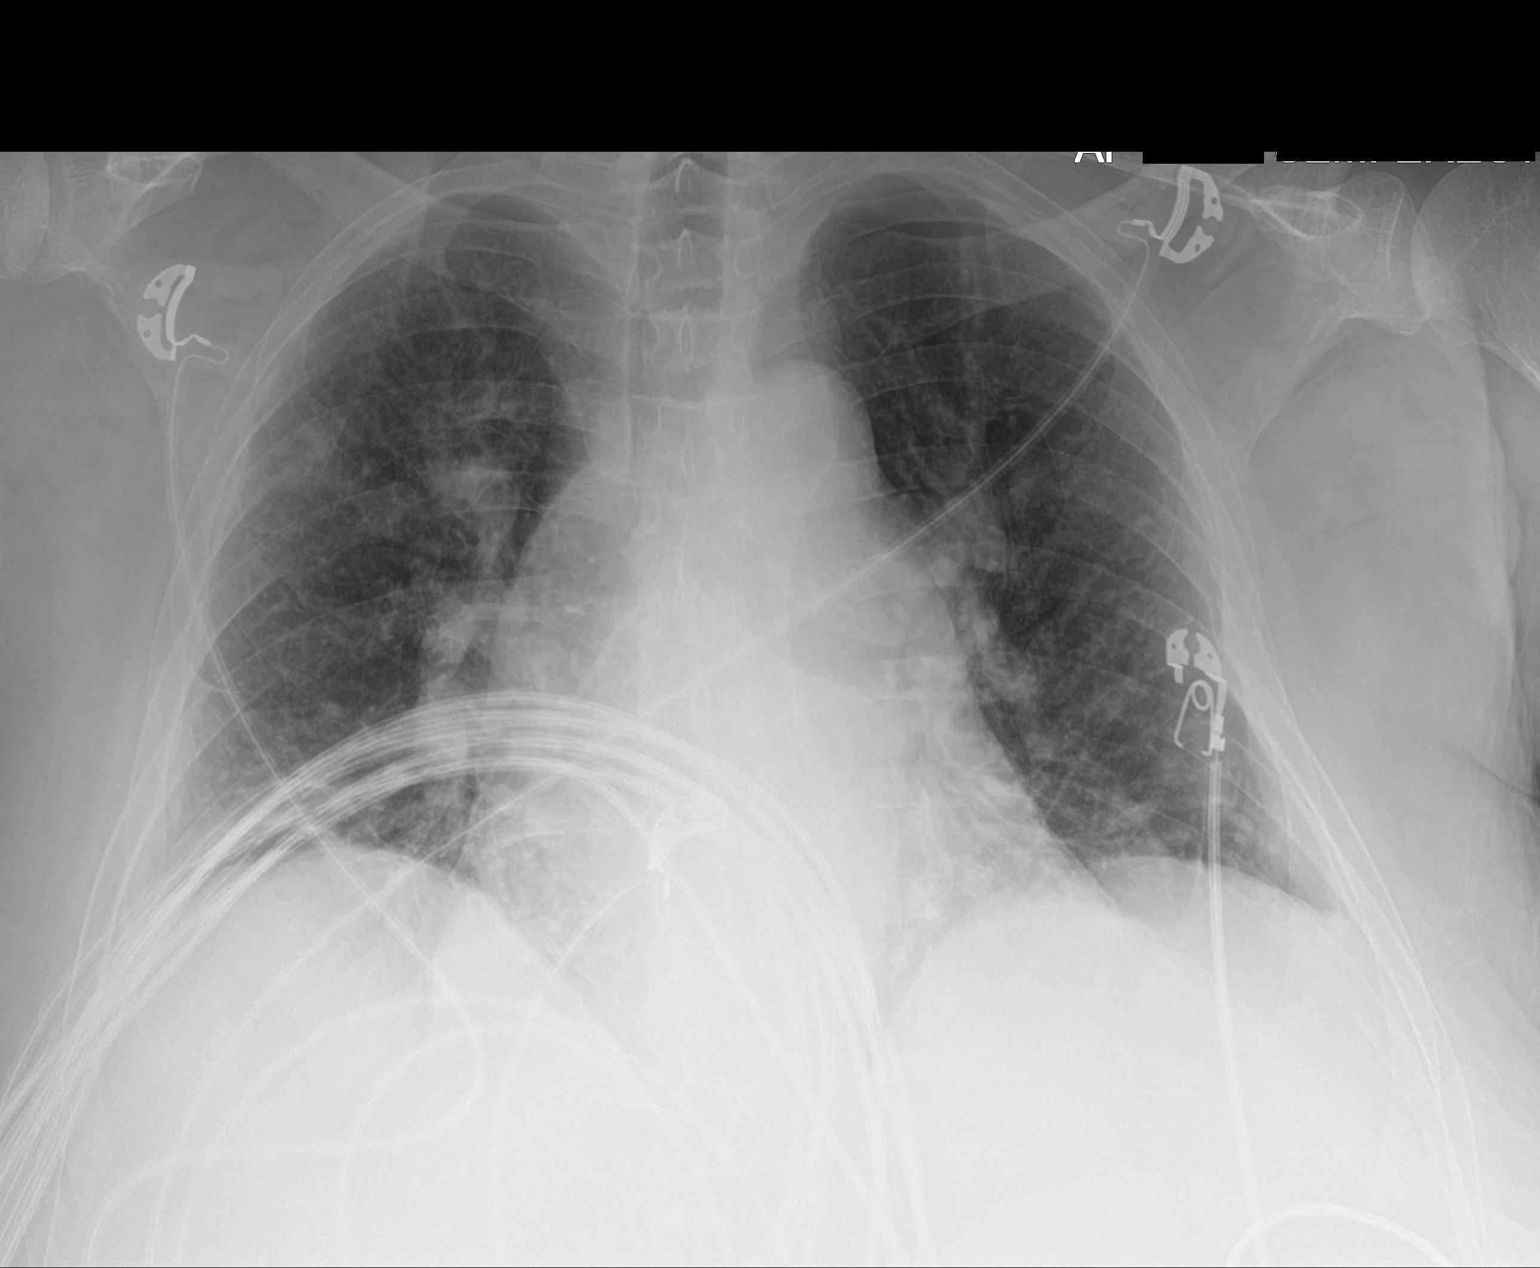

[1 of 1 positions shown; findings below may reference images not displayed]

FINDINGS: Inspiration remains shallow. There is stable cardiomegaly with
central pulmonary vascular congestion, trace left pleural effusion
and mild pulmonary edema. Telemetry leads preclude evaluation of the
right lateral costophrenic angle for any residual right pleural
effusion. Trace fluid continues to layer along the minor fissure.
Bilateral infrahilar streaky consolidation probably represents
bronchitis. There is no convincing evidence for pneumonia. There is
no pneumothorax. Diffuse bone demineralization and mild skeletal
degenerative changes are present. There are telemetry leads.
IMPRESSION: Shallow inspiration with stable cardiomegaly, mild pulmonary edema
and trace bilateral pleural effusion.

Streaky infrahilar consolidation, probably a nonspecific bronchitis.
No pneumonia.

## 2021-08-07 IMAGING — CR DG HIP (WITH OR WITHOUT PELVIS) 2-3V*L*
3 series · 3 of 3 positions shown · non-contrast
Comparison: 12/21/2019

CLINICAL DATA: Left hip pain for 1 day, no known injury, initial
encounter

EXAM:
DG HIP (WITH OR WITHOUT PELVIS) 3V LEFT

[pelvis ap]
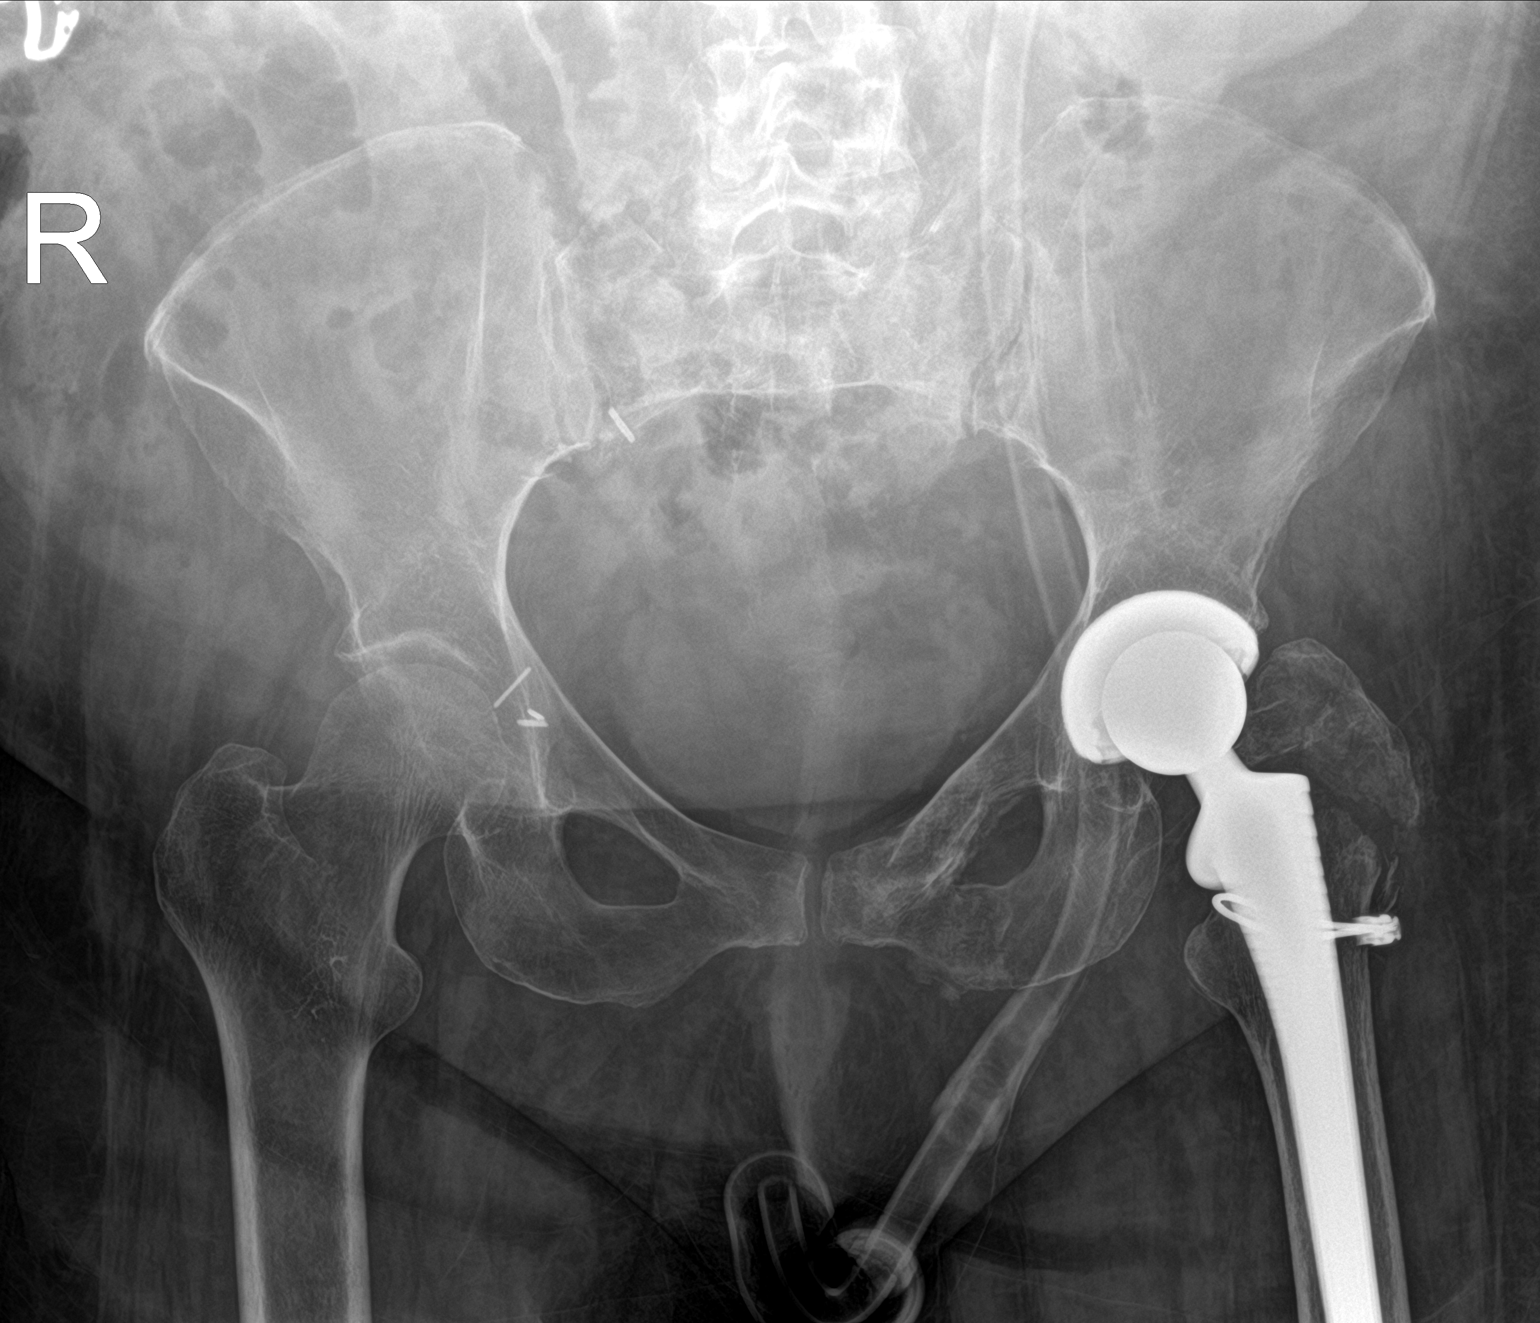

[hip ap]
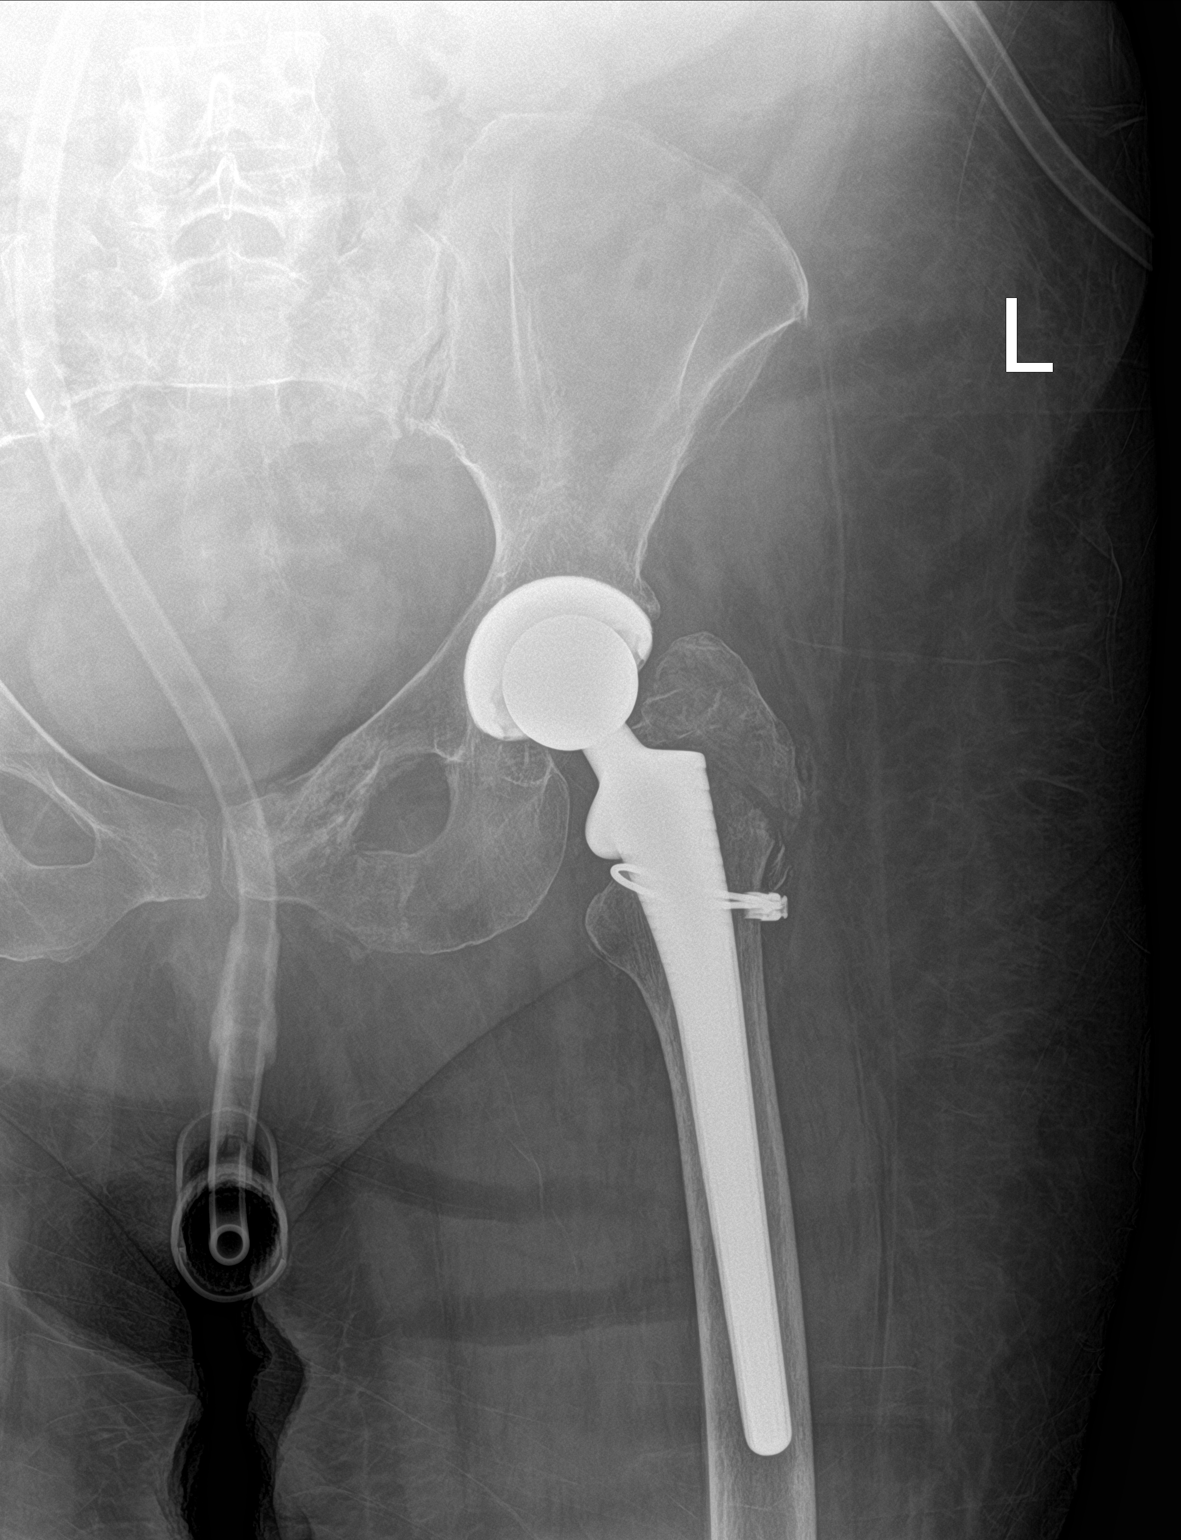

[hip lat]
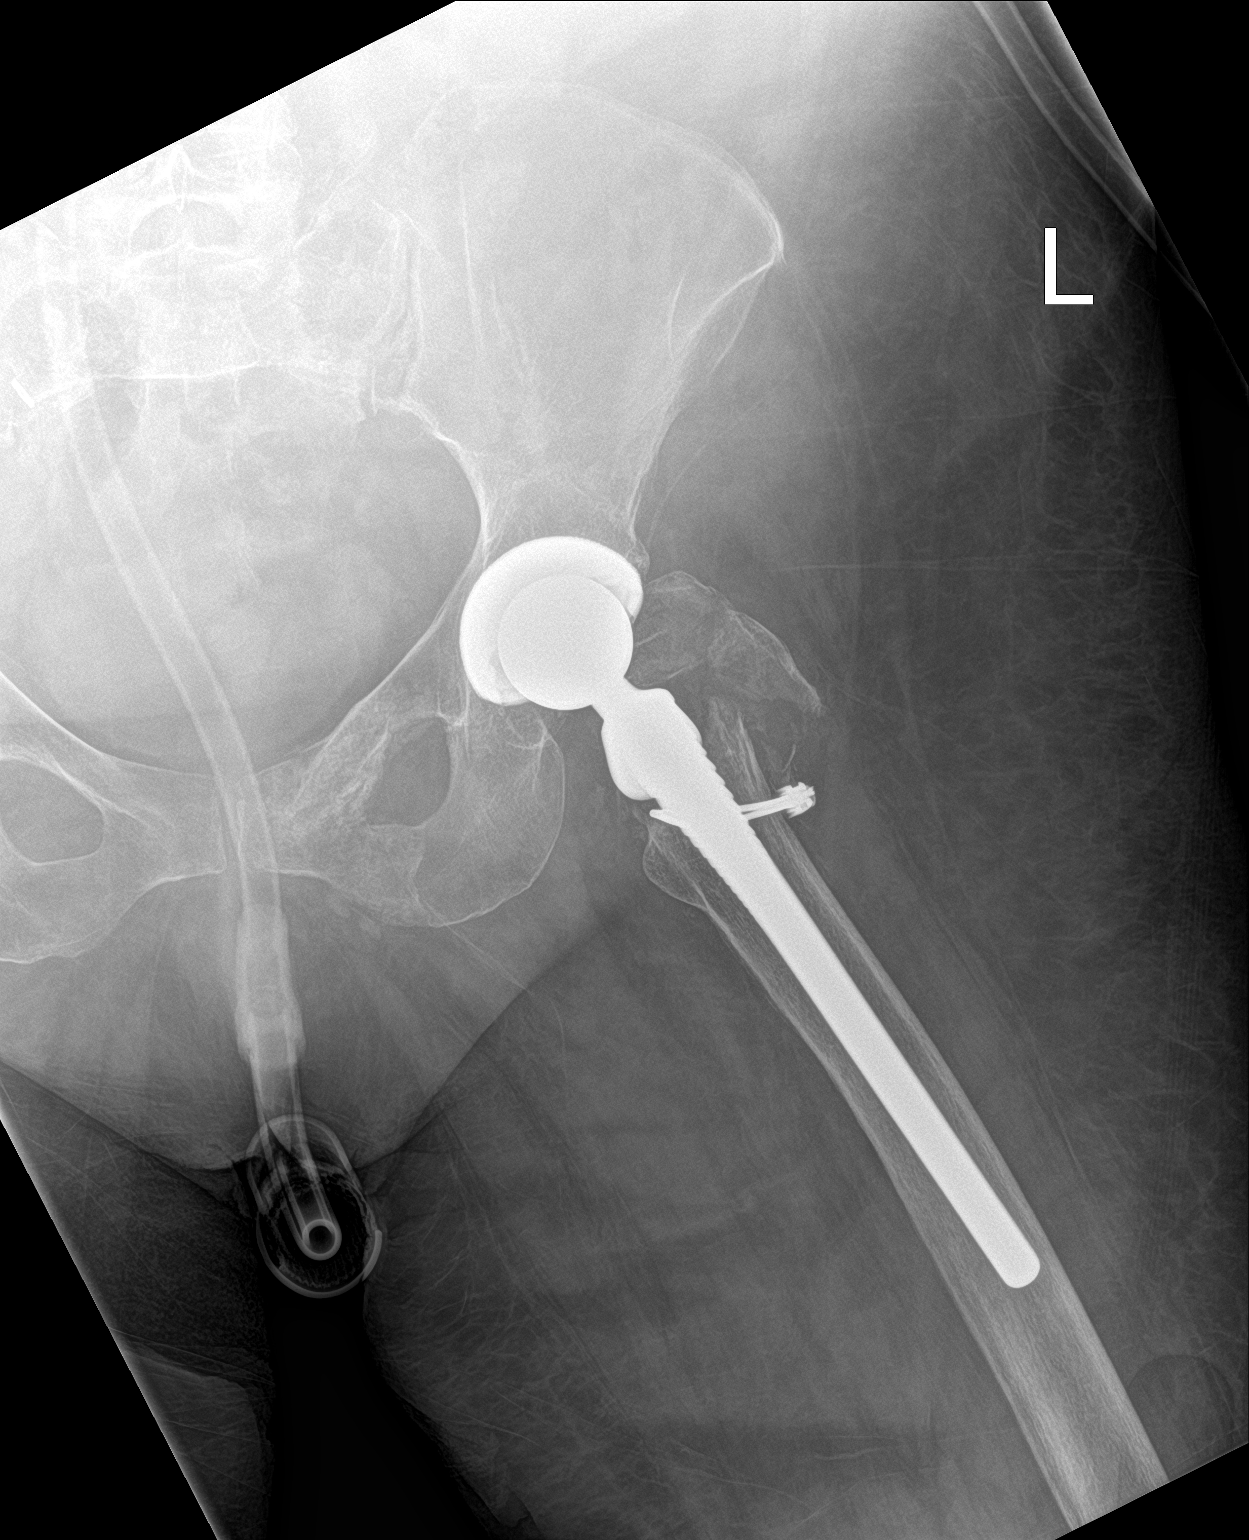

[3 of 3 positions shown; findings below may reference images not displayed]

FINDINGS: Left hip replacement is again seen and stable. The greater
trochanter fracture fragment is again seen and slightly displaced
when compare with the prior exam. Prior left pubic rami fractures
are again noted. Callus formation in these fractures. No soft tissue
abnormality is seen.
IMPRESSION: Changes consistent with prior left hip replacement.

Healing pubic rami fractures on the left are noted.

Increased fragmentation of the greater trochanter fragment seen on
the prior exam.

## 2021-08-08 IMAGING — NM NM PULMONARY PERF PARTICULATE
8 series · 8 of 8 positions shown · non-contrast
Comparison: Chest radiograph January 31, 2020

CLINICAL DATA: Shortness of breath

EXAM:
NUCLEAR MEDICINE PERFUSION LUNG SCAN
TECHNIQUE: Perfusion images were obtained in multiple projections after
intravenous injection of radiopharmaceutical.
Ventilation scans intentionally deferred if perfusion scan and chest
x-ray adequate for interpretation during COVID 19 epidemic.
Views: Anterior, posterior, left lateral, right lateral, RPO, LPO,
RAO, LAO
RADIOPHARMACEUTICALS:  1.55 mCi Qc-JJm MAA IV

[Series 1: perfusion 4x2 at 3_12_(id) 11_40_02 am · 4.14mm/px · 1 of 1 slices shown (1 of 8)]
[im 1/1]
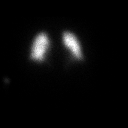

[Series 1: perfusion 4x2 at 3_12_(id) 11_40_02 am · 4.14mm/px · 1 of 1 slices shown (2 of 8)]
[im 1/1]
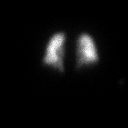

[Series 2: perfusion 4x2 at 3_12_(id) 11_40_02 am · 4.14mm/px · 1 of 1 slices shown (3 of 8)]
[im 1/1]
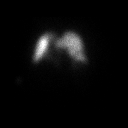

[Series 2: perfusion 4x2 at 3_12_(id) 11_40_02 am · 4.14mm/px · 1 of 1 slices shown (4 of 8)]
[im 1/1]
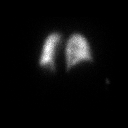

[Series 3: perfusion 4x2 at 3_12_(id) 11_40_02 am · 4.14mm/px · 1 of 1 slices shown (5 of 8)]
[im 1/1]
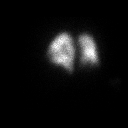

[Series 3: perfusion 4x2 at 3_12_(id) 11_40_02 am · 4.14mm/px · 1 of 1 slices shown (6 of 8)]
[im 1/1]
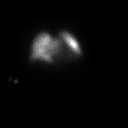

[Series 4: perfusion 4x2 at 3_12_(id) 11_40_02 am · 4.14mm/px · 1 of 1 slices shown (7 of 8)]
[im 1/1]
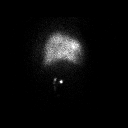

[Series 4: perfusion 4x2 at 3_12_(id) 11_40_02 am · 4.14mm/px · 1 of 1 slices shown (8 of 8)]
[im 1/1]
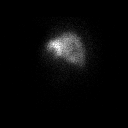

[8 of 8 positions shown; findings below may reference images not displayed]

FINDINGS: Radiotracer uptake bilaterally is homogeneous and symmetric. No
focal perfusion defects evident.
IMPRESSION: No perfusion defects evident. Very low probability of pulmonary
embolus.

## 2021-08-08 IMAGING — DX DG CHEST 2V
2 series · 2 of 2 positions shown · non-contrast
Comparison: January 31, 2020.

CLINICAL DATA: Dyspnea.

EXAM:
CHEST - 2 VIEW

[chest lat]
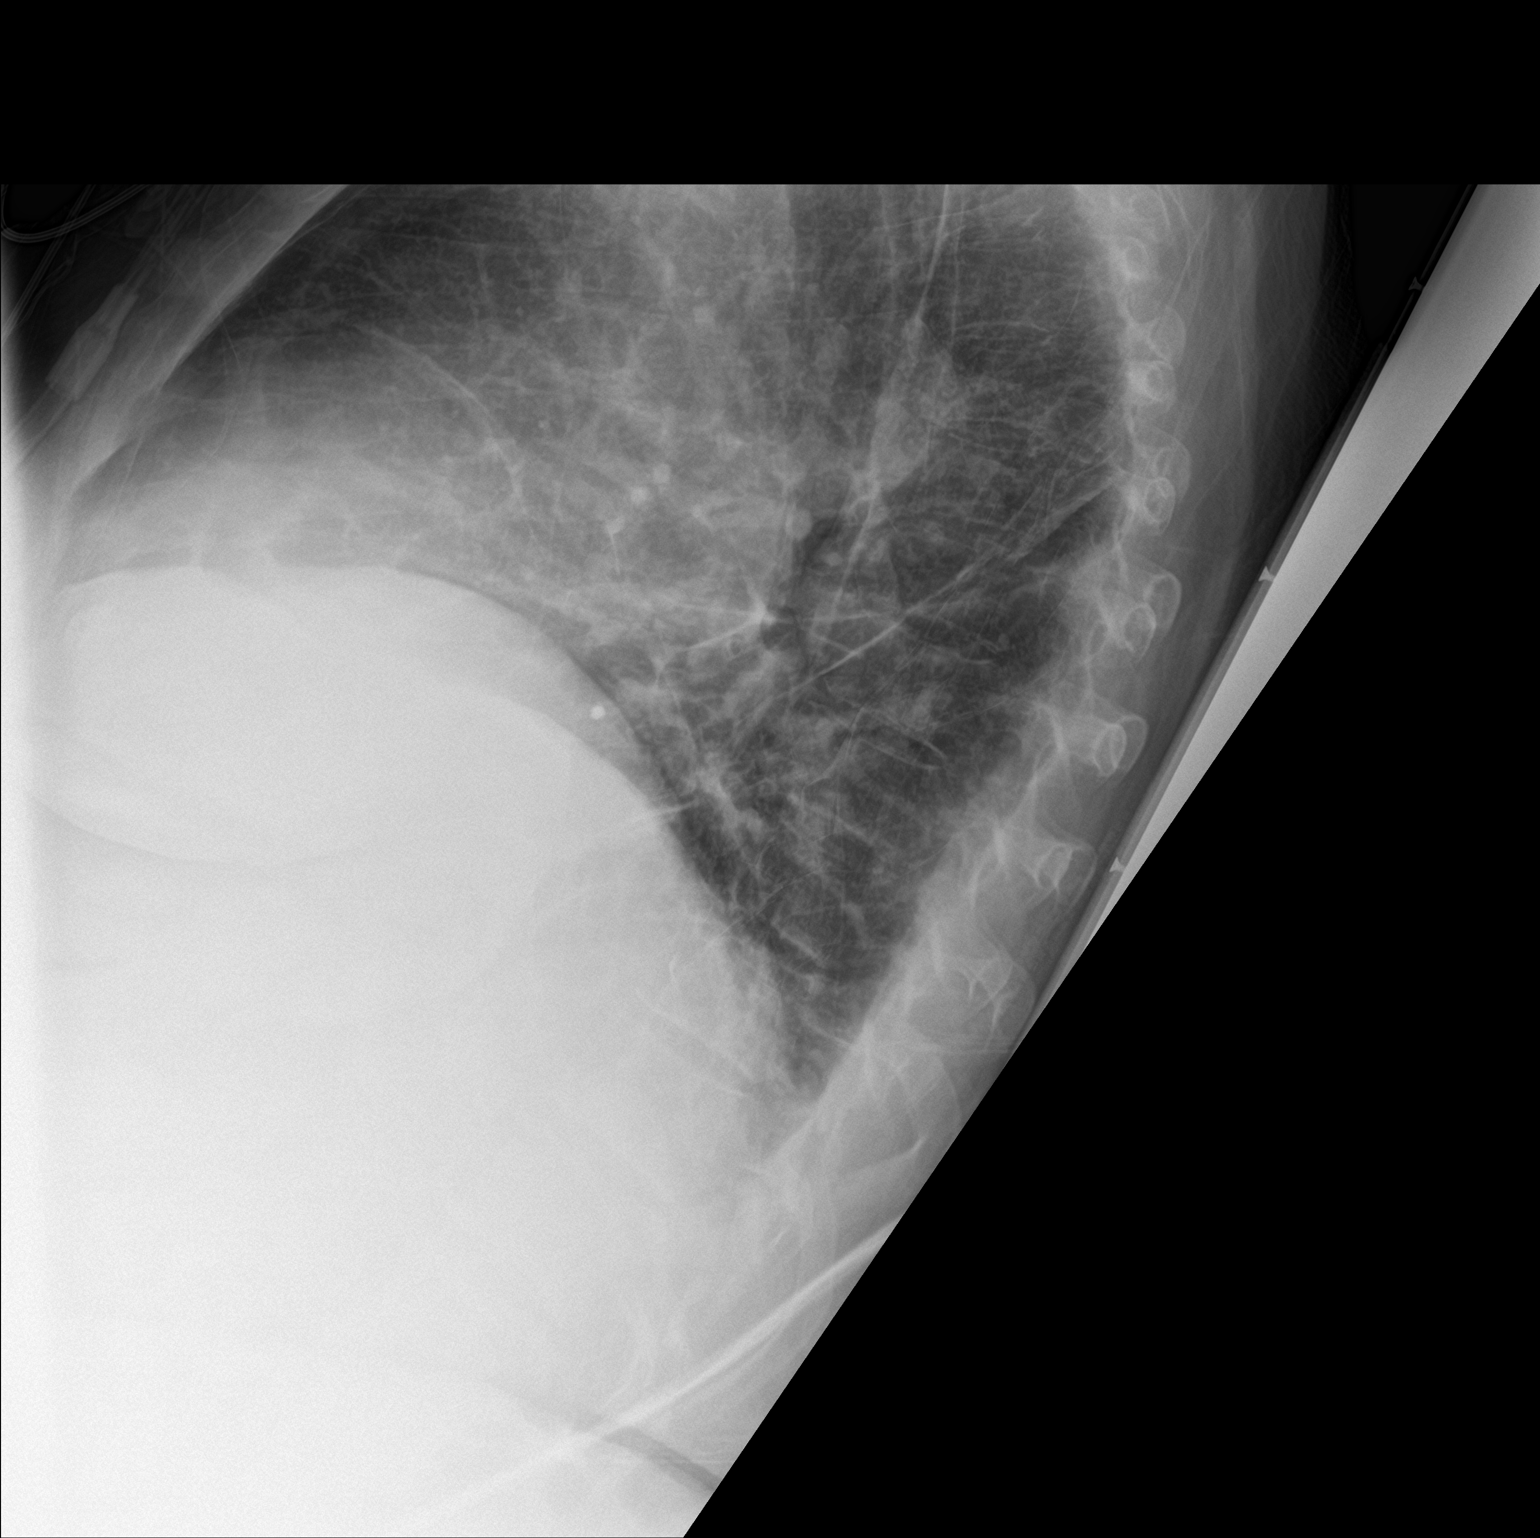

[chest ap]
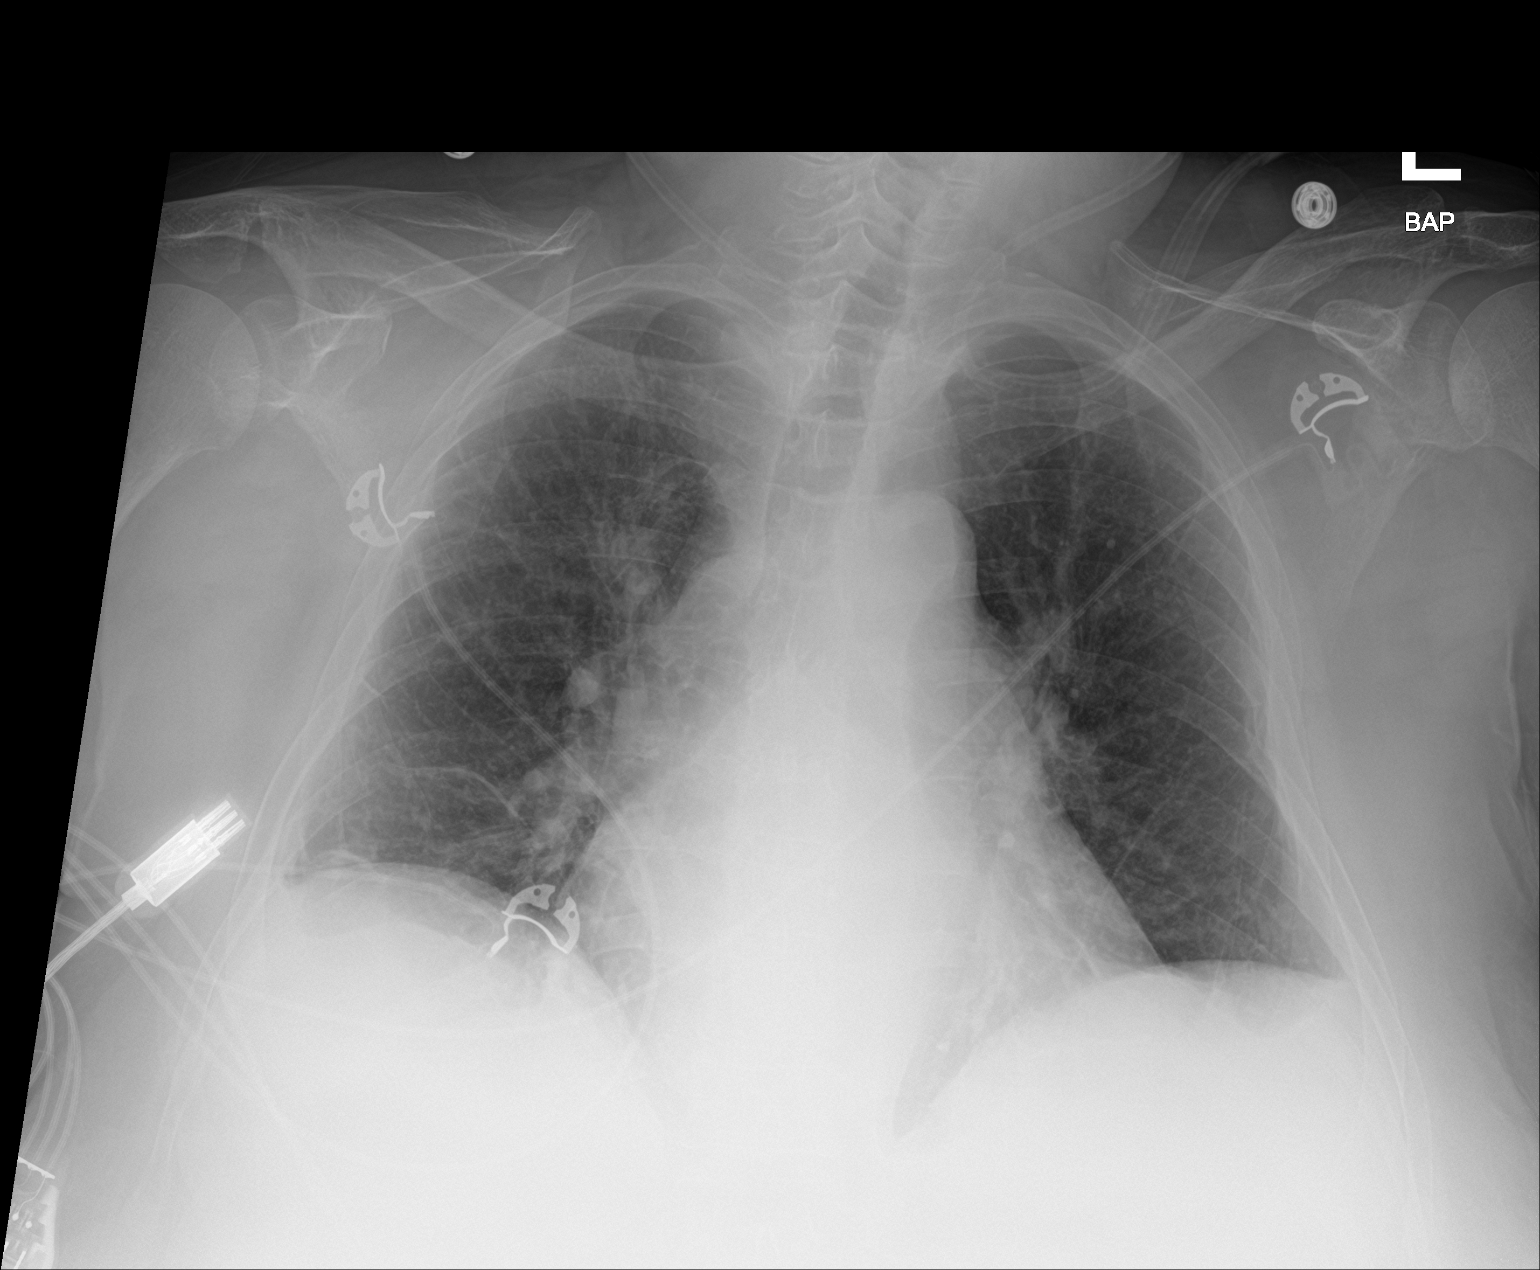

[2 of 2 positions shown; findings below may reference images not displayed]

FINDINGS: Stable cardiomediastinal silhouette. No pneumothorax or pleural
effusion is noted. Left lung is clear. Minimal right basilar
subsegmental atelectasis is noted. The visualized skeletal
structures are unremarkable.
IMPRESSION: Minimal right basilar subsegmental atelectasis.

## 2021-08-09 IMAGING — DX DG CHEST 1V PORT
1 series · 1 of 1 positions shown · non-contrast
Comparison: 02/02/2020

CLINICAL DATA: Shortness of breath

EXAM:
PORTABLE CHEST 1 VIEW

[chest ap]
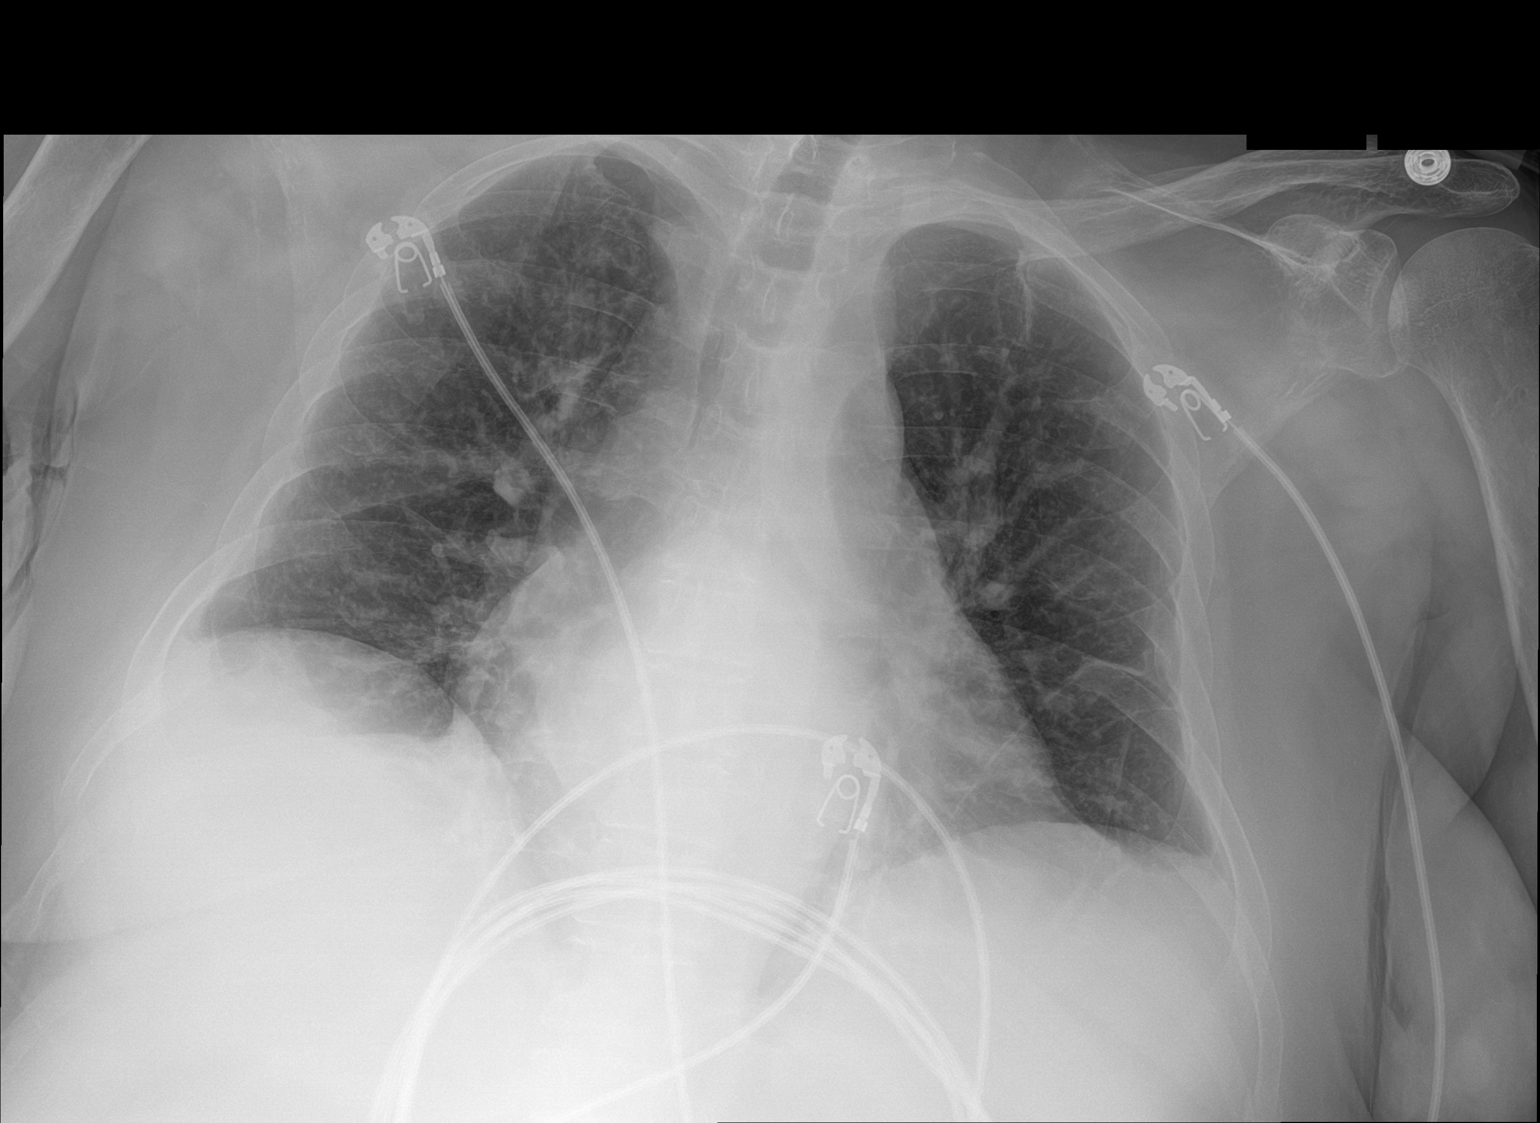

[1 of 1 positions shown; findings below may reference images not displayed]

FINDINGS: Mild right basilar opacity, likely atelectasis. Lungs otherwise
clear. No pleural effusion or pneumothorax.

Mild cardiomegaly.
IMPRESSION: No evidence of acute cardiopulmonary disease.

## 2021-08-10 IMAGING — DX DG CHEST 1V PORT
1 series · 1 of 1 positions shown · non-contrast
Comparison: 02/03/2020 and older exams.

CLINICAL DATA: Respiratory distress.  Short of breath.

EXAM:
PORTABLE CHEST 1 VIEW

[chest ap]
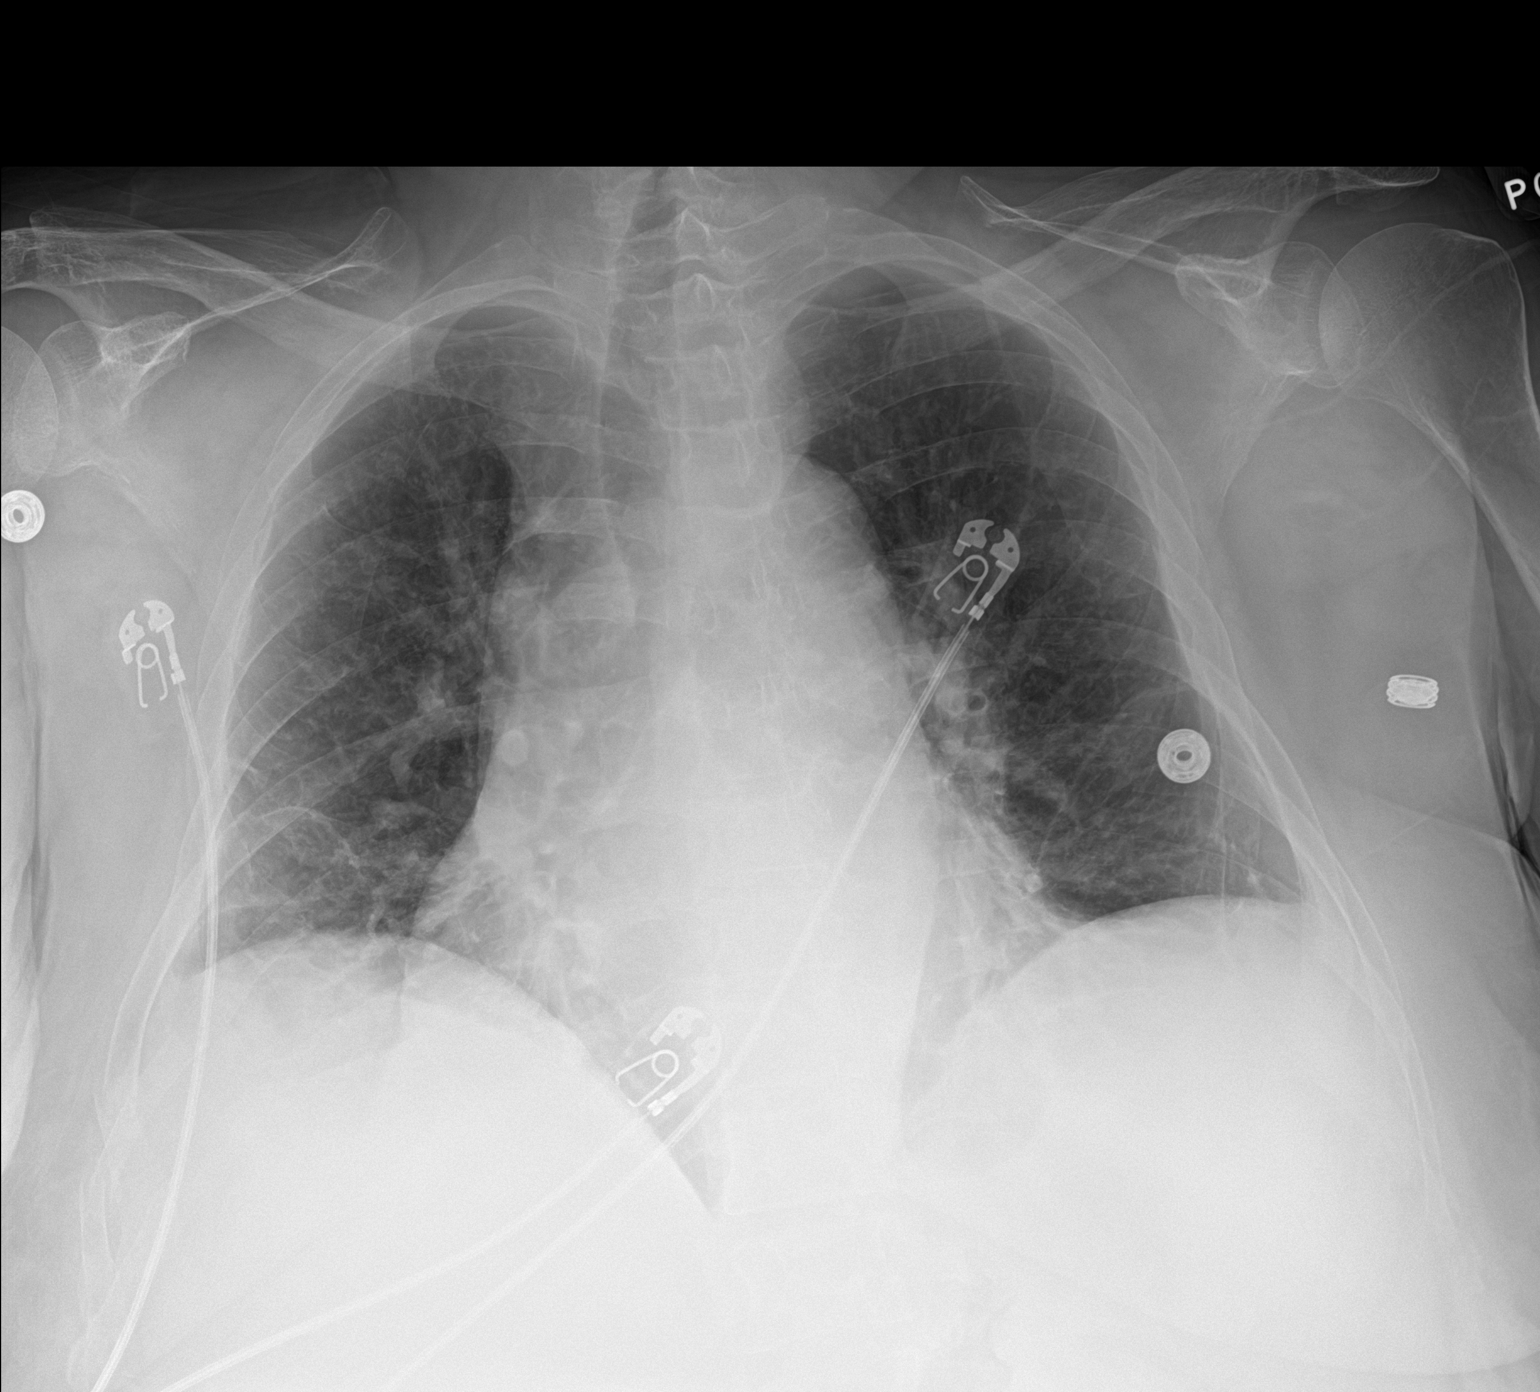

[1 of 1 positions shown; findings below may reference images not displayed]

FINDINGS: Cardiac silhouette normal in size.  No mediastinal or hilar masses.

Linear opacities are noted at the lung bases consistent with
scarring or atelectasis, stable. Remainder of the lungs is clear.

No convincing pleural effusion.  No pneumothorax.

Skeletal structures are demineralized but grossly intact.
IMPRESSION: 1. No acute cardiopulmonary disease and no change from the previous
day's exam.

## 2021-08-11 IMAGING — DX DG CHEST 1V PORT
1 series · 1 of 1 positions shown · non-contrast
Comparison: Chest x-ray 02/04/2020.

CLINICAL DATA: 63-year-old female with history of shortness of
breath.

EXAM:
PORTABLE CHEST 1 VIEW

[chest ap]
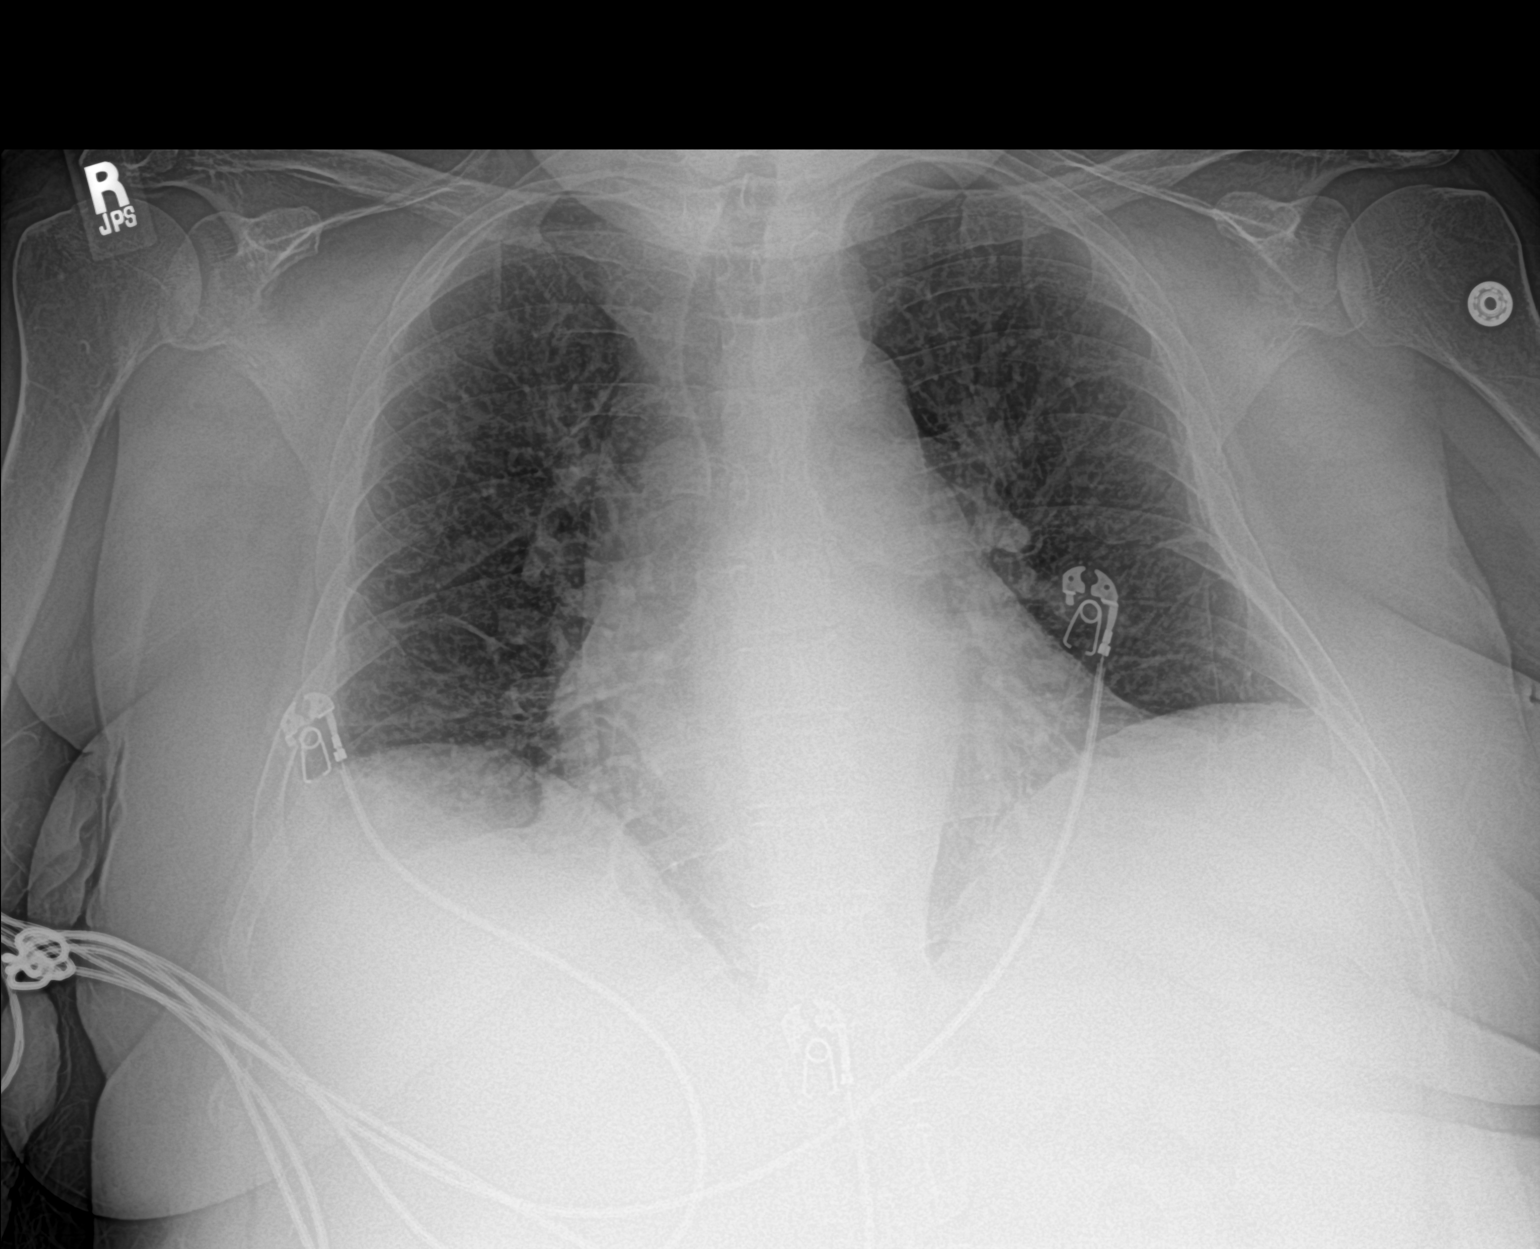

[1 of 1 positions shown; findings below may reference images not displayed]

FINDINGS: Lung volumes are low. No consolidative airspace disease. No pleural
effusions. No evidence of pulmonary edema. No pneumothorax. Heart
size is mildly enlarged. Upper mediastinal contours are within
normal limits.
IMPRESSION: 1. No radiographic evidence of acute cardiopulmonary disease.
2. Mild cardiomegaly.

## 2021-08-14 IMAGING — DX DG CHEST 1V PORT
1 series · 1 of 1 positions shown · non-contrast
Comparison: Three days ago

CLINICAL DATA: Fever

EXAM:
PORTABLE CHEST 1 VIEW

[chest ap]
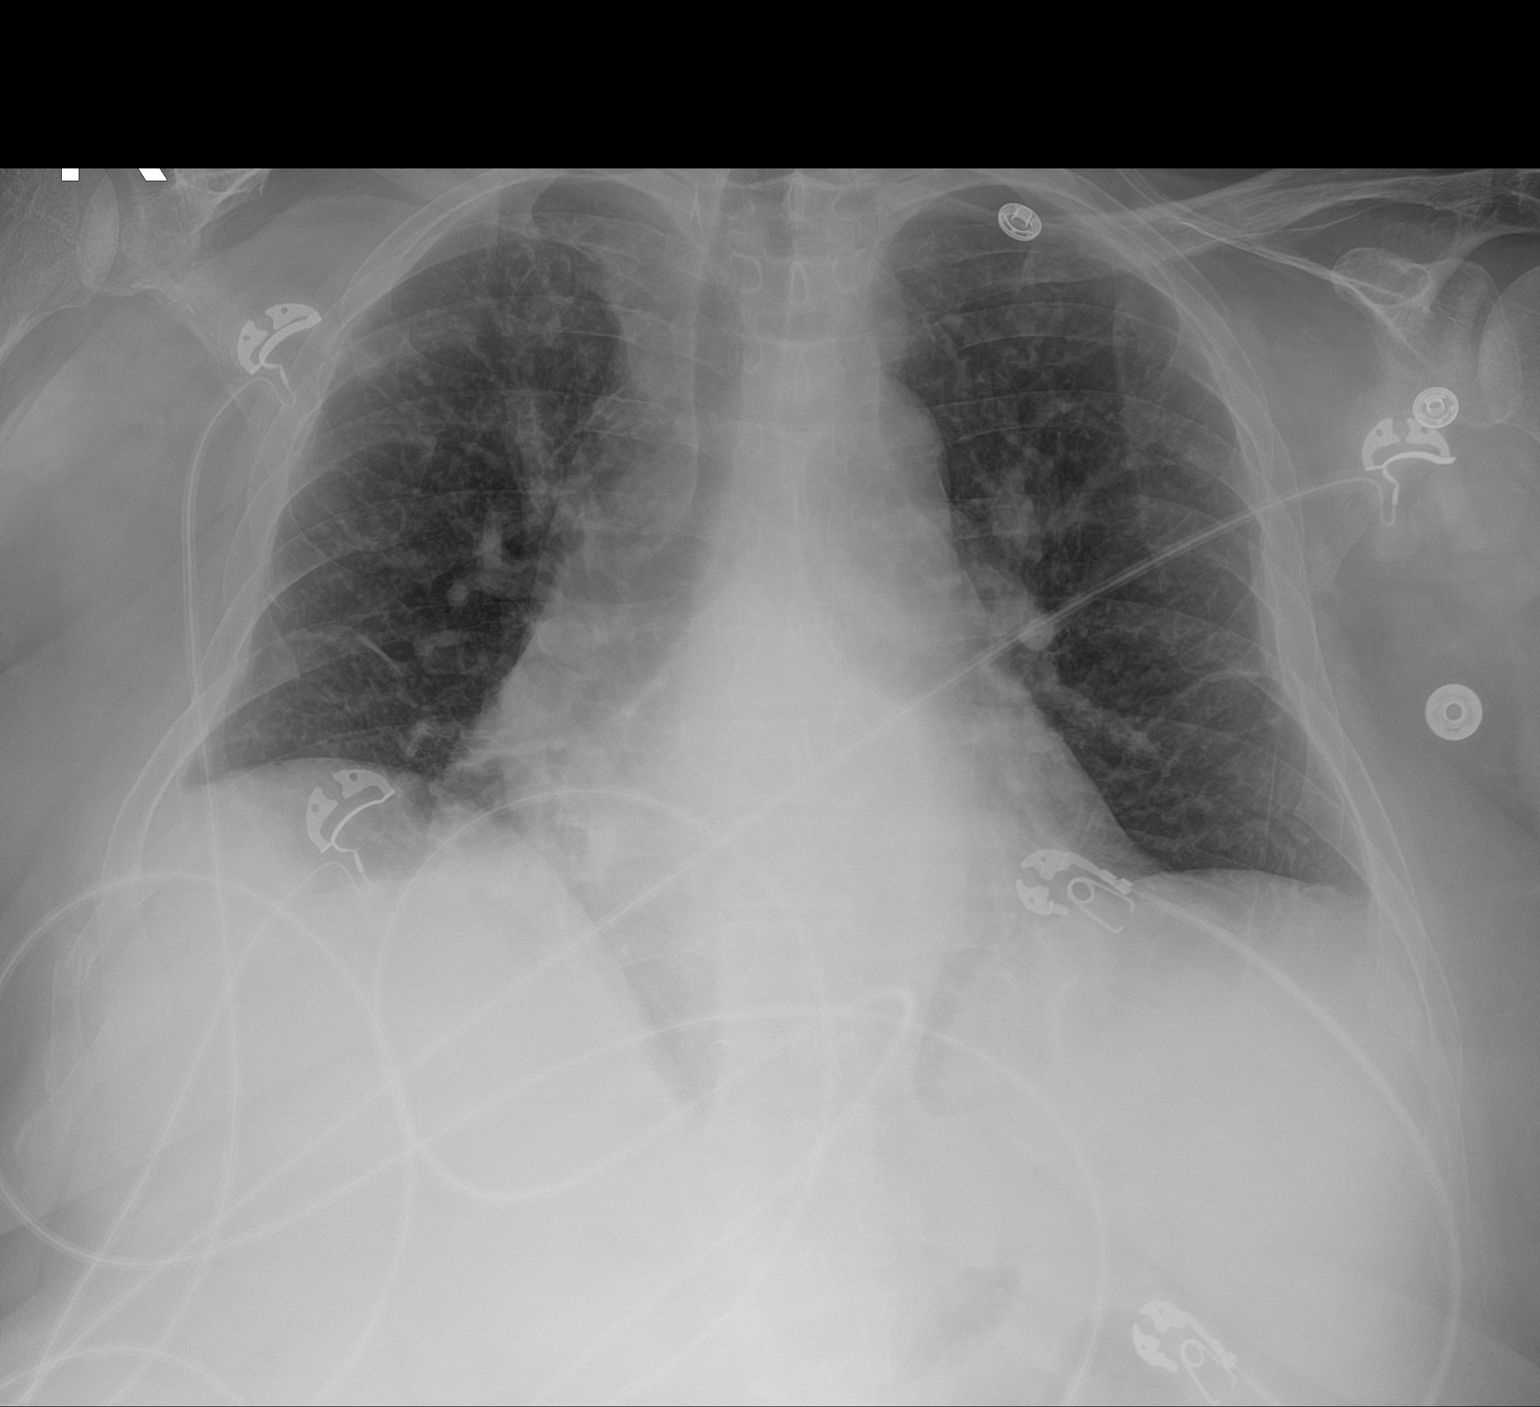

[1 of 1 positions shown; findings below may reference images not displayed]

FINDINGS: Minimal linear opacity that is stable and compatible with scarring.
Stable low lung volumes. Normal heart size. There is no edema,
consolidation, effusion, or pneumothorax.
IMPRESSION: Stable from 3 days ago.  No focal pneumonia.

## 2021-08-17 IMAGING — DX DG CHEST 1V PORT
1 series · 1 of 1 positions shown · non-contrast
Comparison: February 08, 2020

CLINICAL DATA: Sepsis.  Shortness of breath.

EXAM:
PORTABLE CHEST 1 VIEW

[chest]
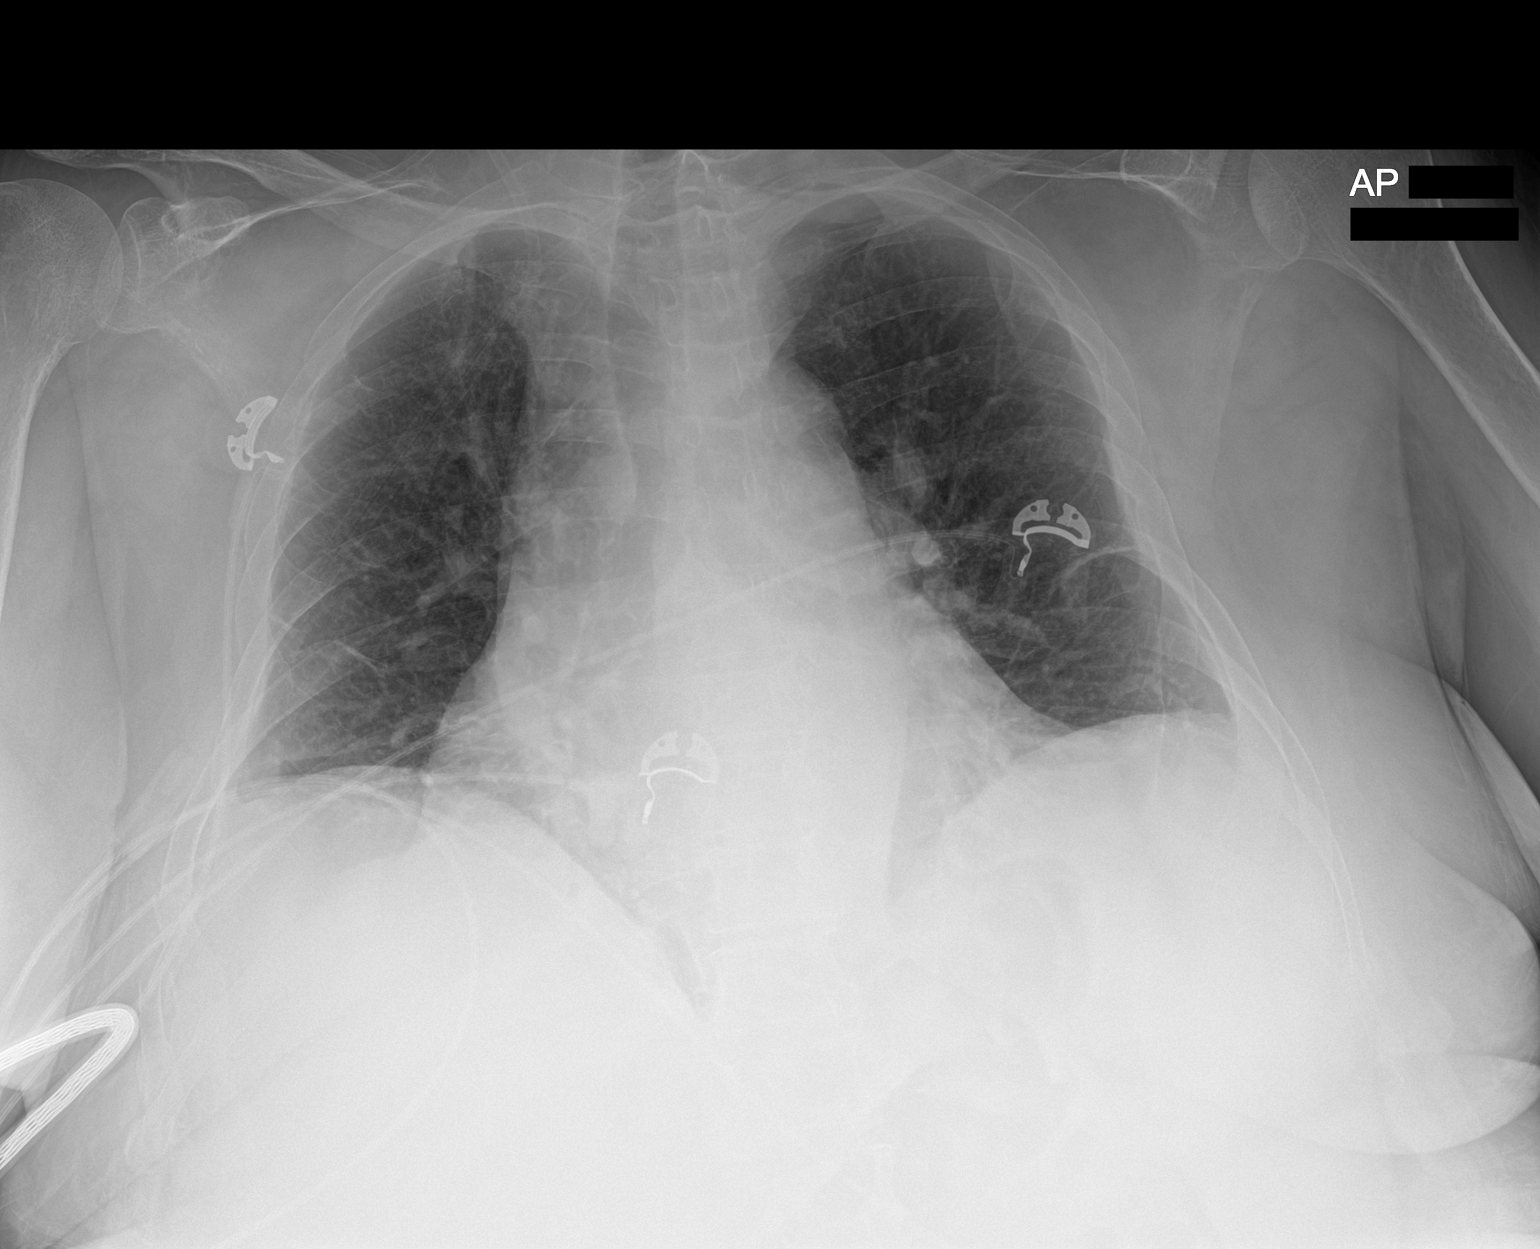

[1 of 1 positions shown; findings below may reference images not displayed]

FINDINGS: Stable cardiomegaly. No pneumothorax. No nodules or masses. No focal
infiltrates.
IMPRESSION: No active disease.

## 2021-08-18 IMAGING — DX DG CHEST 1V PORT
1 series · 1 of 1 positions shown · non-contrast
Comparison: 02/11/2020

CLINICAL DATA: Polycystic kidney disease status post renal
transplant, short of breath, nausea, cough

EXAM:
PORTABLE CHEST 1 VIEW

[chest ap]
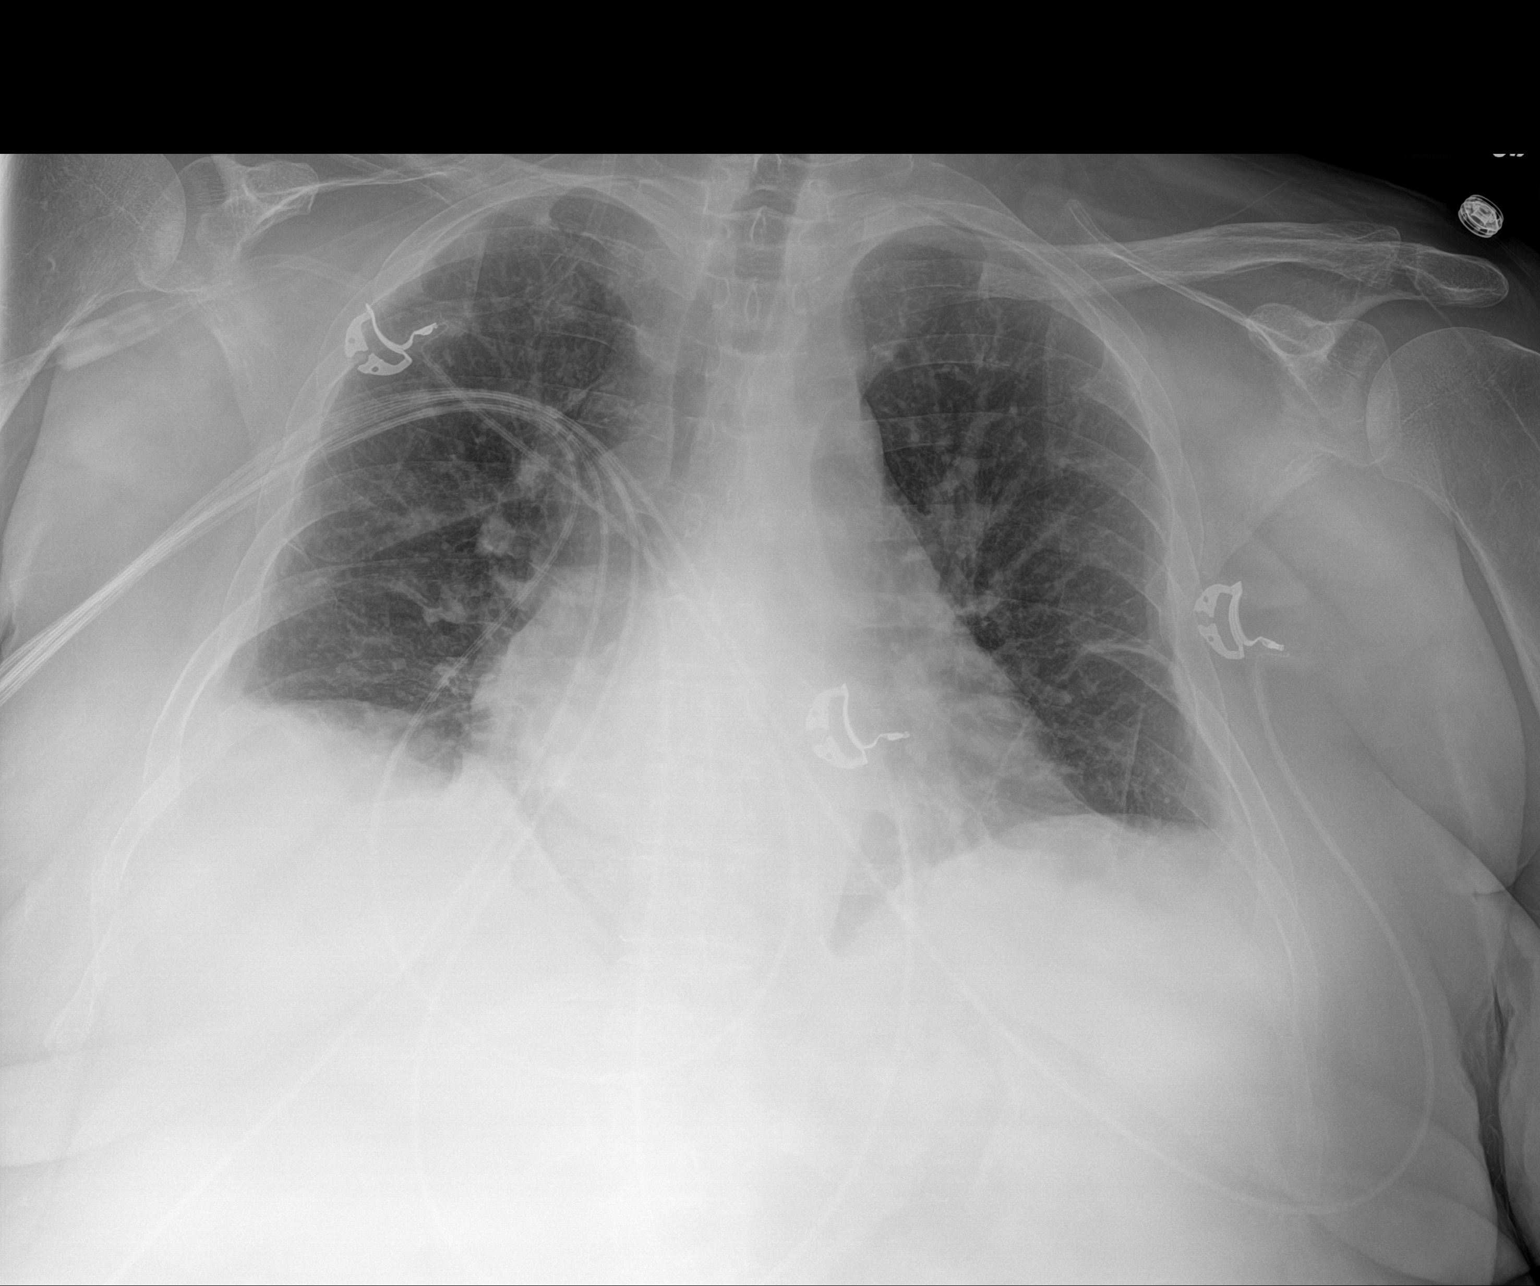

[1 of 1 positions shown; findings below may reference images not displayed]

FINDINGS: Single frontal view of the chest demonstrates a stable cardiac
silhouette. Small right pleural effusion has developed in the
interim. No airspace disease or pneumothorax. Mild central vascular
congestion unchanged. No acute bony abnormalities.
IMPRESSION: 1. Chronic central vascular congestion.
2. Trace right pleural effusion.

## 2021-08-19 IMAGING — NM NM PULMONARY PERF PARTICULATE
8 series · 8 of 8 positions shown · non-contrast
Comparison: Chest radiograph 04/19/2019

CLINICAL DATA: PE suspected.  Positive D-dimer.

EXAM:
NUCLEAR MEDICINE PERFUSION LUNG SCAN
TECHNIQUE: Perfusion images were obtained in multiple projections after
intravenous injection of radiopharmaceutical.
Ventilation scans intentionally deferred if perfusion scan and chest
x-ray adequate for interpretation during COVID 19 epidemic.
RADIOPHARMACEUTICALS:  1.7 mCi Kc-ZZm MAA IV

[Series 1: ant/post perf · 4.14mm/px · 1 of 1 slices shown (1 of 2)]
[im 1/1]
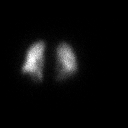

[Series 1: ant/post perf · 4.14mm/px · 1 of 1 slices shown (2 of 2)]
[im 1/1]
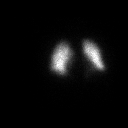

[Series 2: lao/rpo perf · 4.14mm/px · 1 of 1 slices shown (1 of 2)]
[im 1/1]
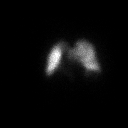

[Series 2: lao/rpo perf · 4.14mm/px · 1 of 1 slices shown (2 of 2)]
[im 1/1]
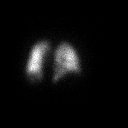

[Series 3: lpo/rao perf · 4.14mm/px · 1 of 1 slices shown (1 of 2)]
[im 1/1]
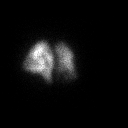

[Series 3: lpo/rao perf · 4.14mm/px · 1 of 1 slices shown (2 of 2)]
[im 1/1]
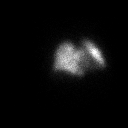

[Series 4: lt lat/rt lat perf · 4.14mm/px · 1 of 1 slices shown (1 of 2)]
[im 1/1]
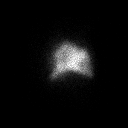

[Series 4: lt lat/rt lat perf · 4.14mm/px · 1 of 1 slices shown (2 of 2)]
[im 1/1]
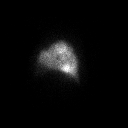

[8 of 8 positions shown; findings below may reference images not displayed]

FINDINGS: No wedge-shaped peripheral perfusion defects. No segmental defects.
No evidence acute pulmonary embolism.
IMPRESSION: No evidence of acute pulmonary embolism.

## 2021-08-20 IMAGING — DX DG CHEST 1V PORT
1 series · 1 of 1 positions shown · non-contrast
Comparison: February 12, 2020.

CLINICAL DATA: Wheezing

EXAM:
PORTABLE CHEST 1 VIEW

[chest]
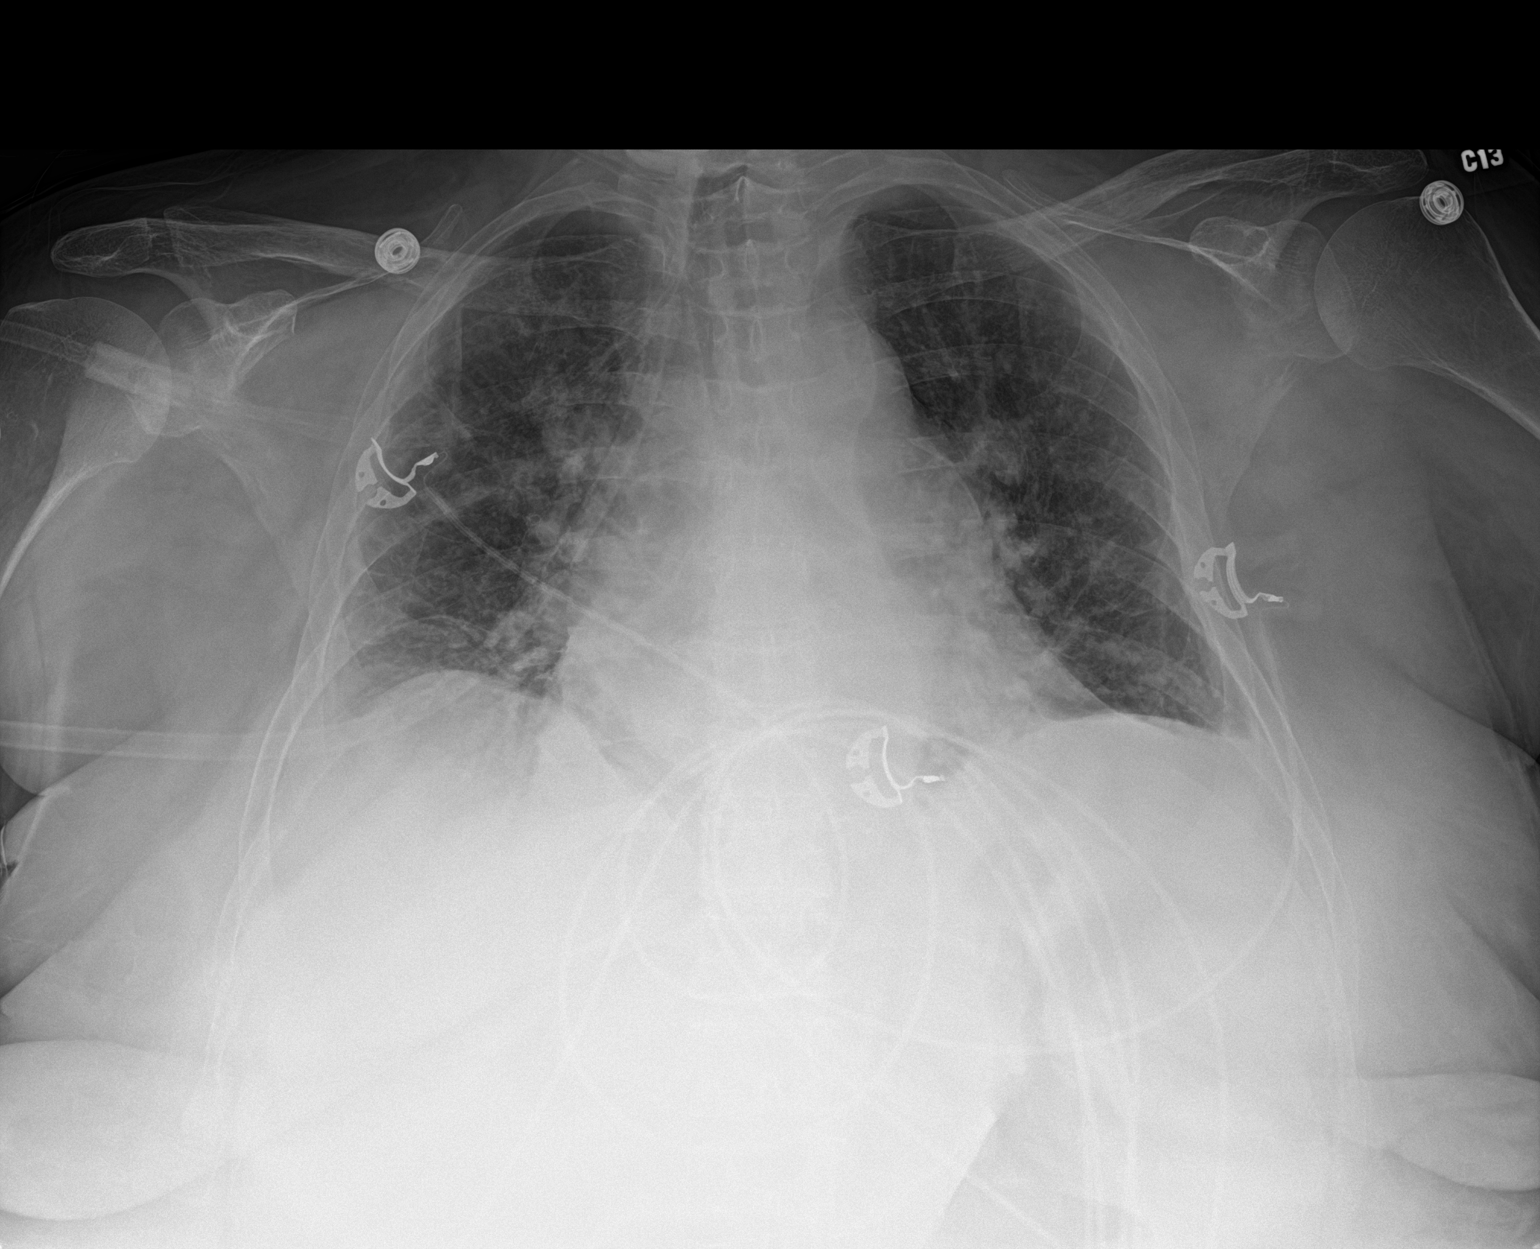

[1 of 1 positions shown; findings below may reference images not displayed]

FINDINGS: There is no evident edema or airspace opacity. Suspected small right
pleural effusion. Heart size and pulmonary vascularity are normal.
No adenopathy. No bone lesions.
IMPRESSION: Small right pleural effusion, similar to recent study. Lungs
otherwise clear. Cardiac silhouette within normal limits.

## 2021-08-23 IMAGING — DX DG CHEST 1V PORT
1 series · 1 of 1 positions shown · non-contrast
Comparison: 02/14/2020

CLINICAL DATA: Shortness of breath

EXAM:
PORTABLE CHEST 1 VIEW

[chest]
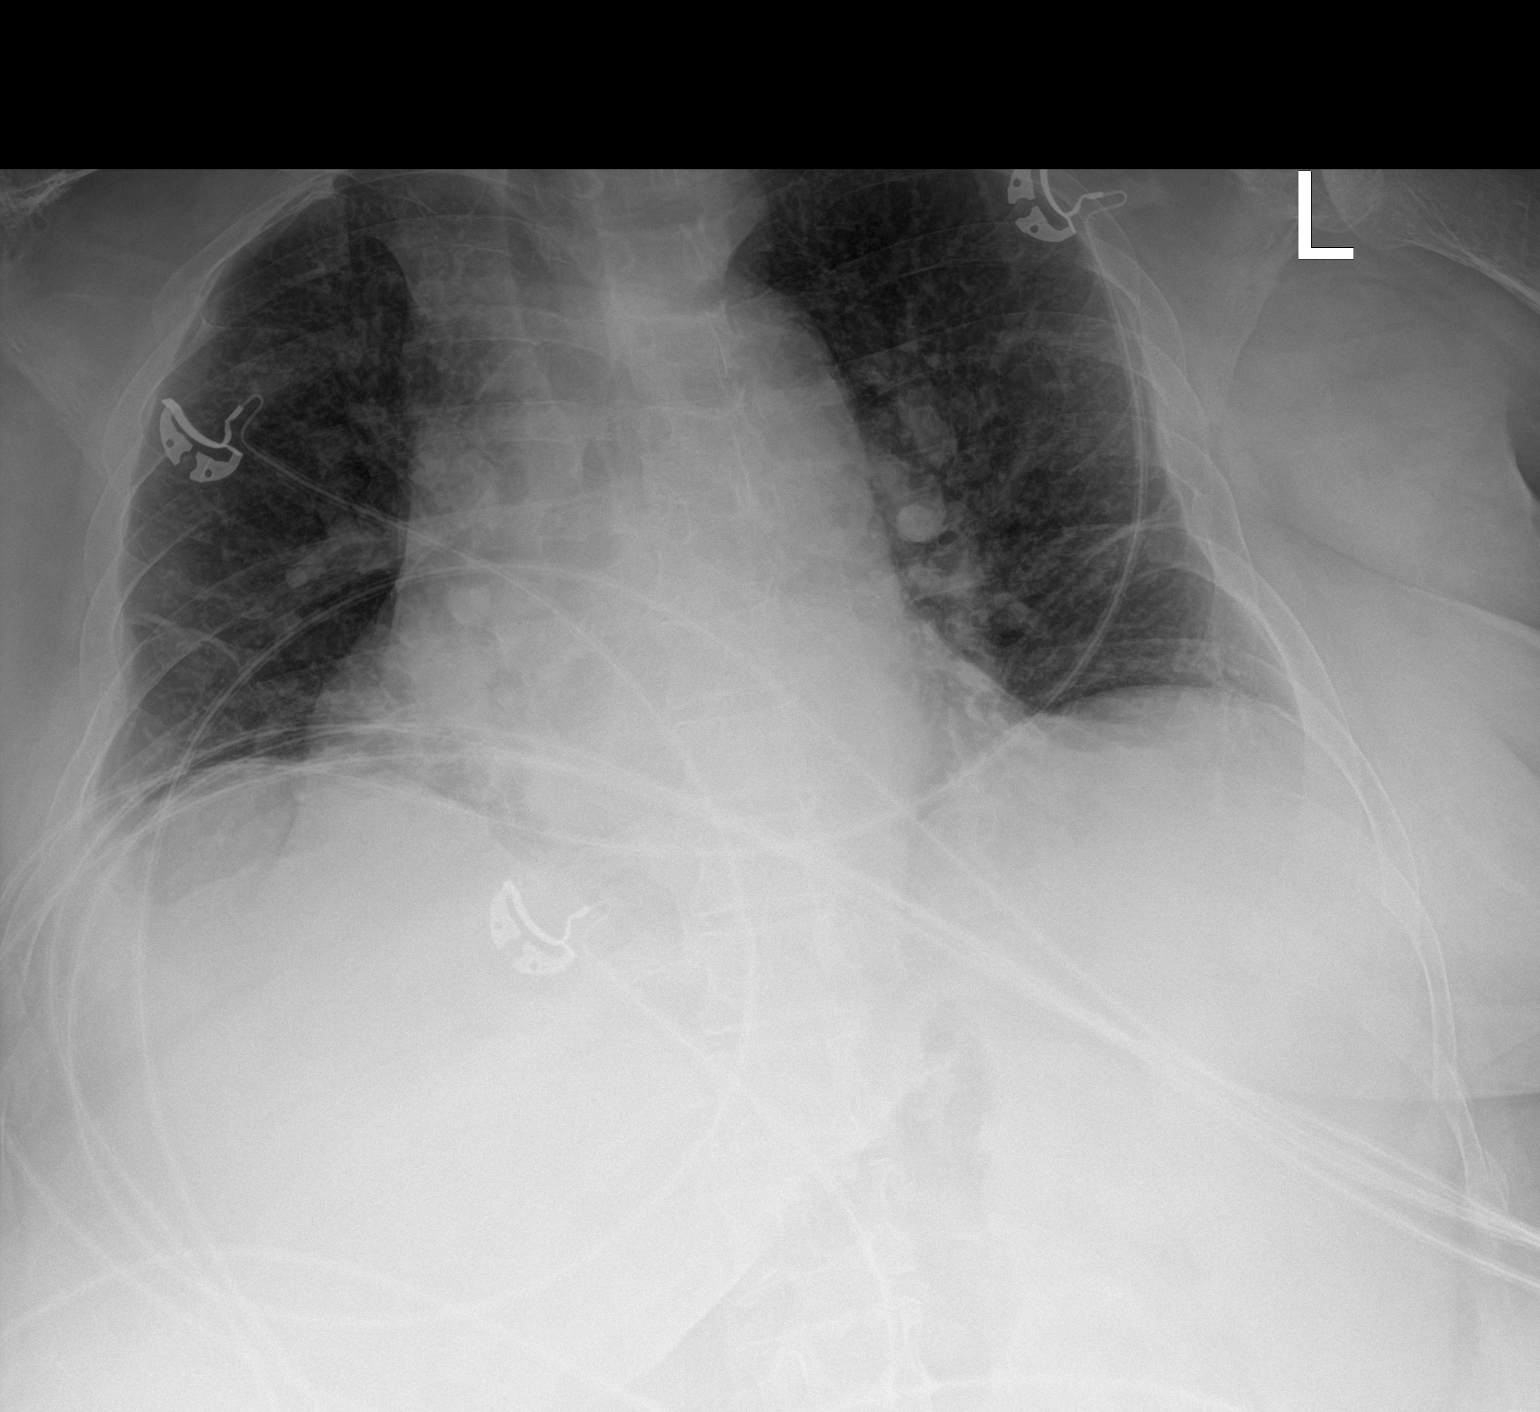

[1 of 1 positions shown; findings below may reference images not displayed]

FINDINGS: Bilateral mild interstitial thickening. No pleural effusion or
pneumothorax. Stable cardiomegaly. No aggressive osseous lesion.
IMPRESSION: Cardiomegaly with mild pulmonary vascular congestion.

## 2021-09-17 IMAGING — DX DG CHEST 1V PORT
1 series · 1 of 1 positions shown · non-contrast
Comparison: 03/08/2020

CLINICAL DATA: Shortness of breath, cough and hypoxia today.
Weakness.

EXAM:
PORTABLE CHEST 1 VIEW

[chest]
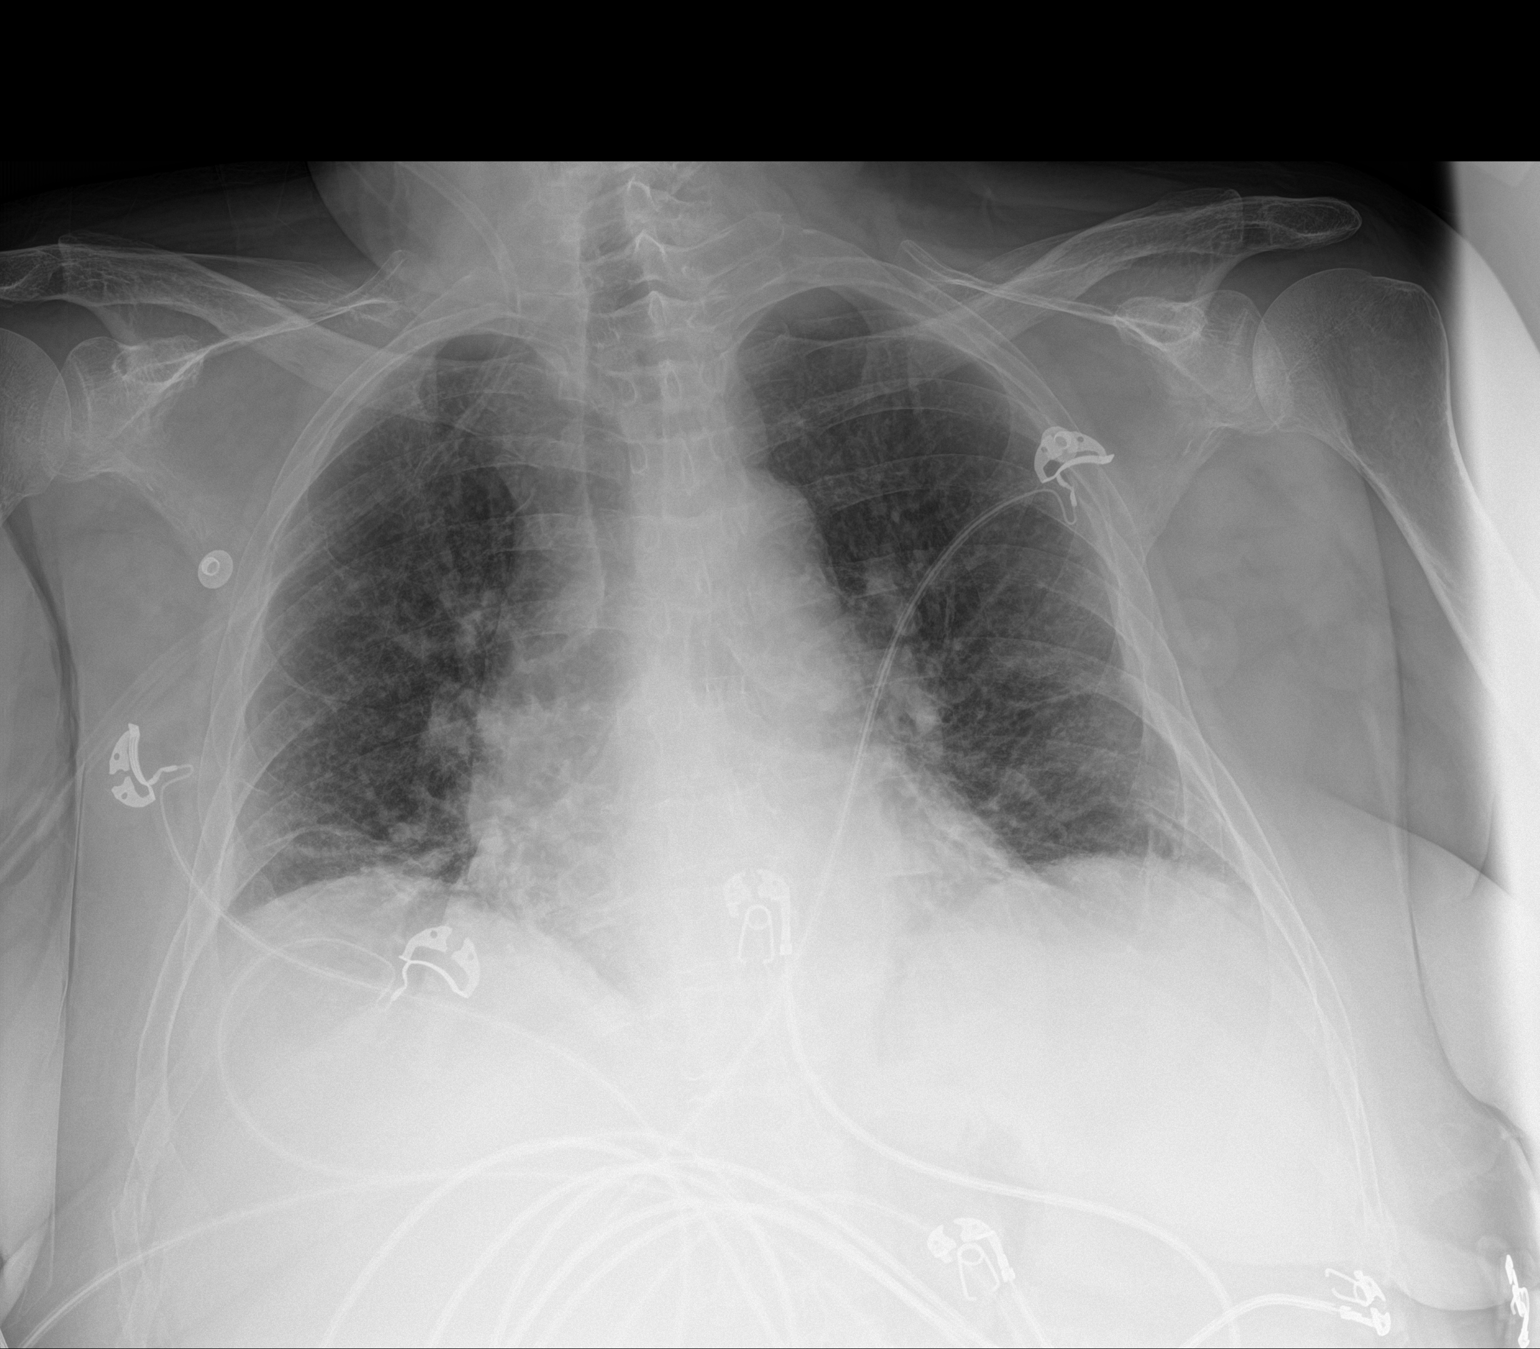

[1 of 1 positions shown; findings below may reference images not displayed]

FINDINGS: Cardiac silhouette is top-normal in size. No mediastinal or hilar
masses.

Lung volumes are relatively low. Mild linear opacities noted at the
bases consistent with atelectasis. Lungs otherwise clear.

No convincing pleural effusion.  No pneumothorax.

Skeletal structures are demineralized. There are rib fractures with
associated callus formation on the right, lateral seventh and eighth
ribs, which were present on the prior study.
IMPRESSION: No acute cardiopulmonary disease.

## 2021-09-18 IMAGING — DX DG CHEST 1V PORT
1 series · 1 of 1 positions shown · non-contrast
Comparison: 03/13/2020.  02/17/2020.

CLINICAL DATA: Pneumonia.

EXAM:
PORTABLE CHEST 1 VIEW

[chest ap]
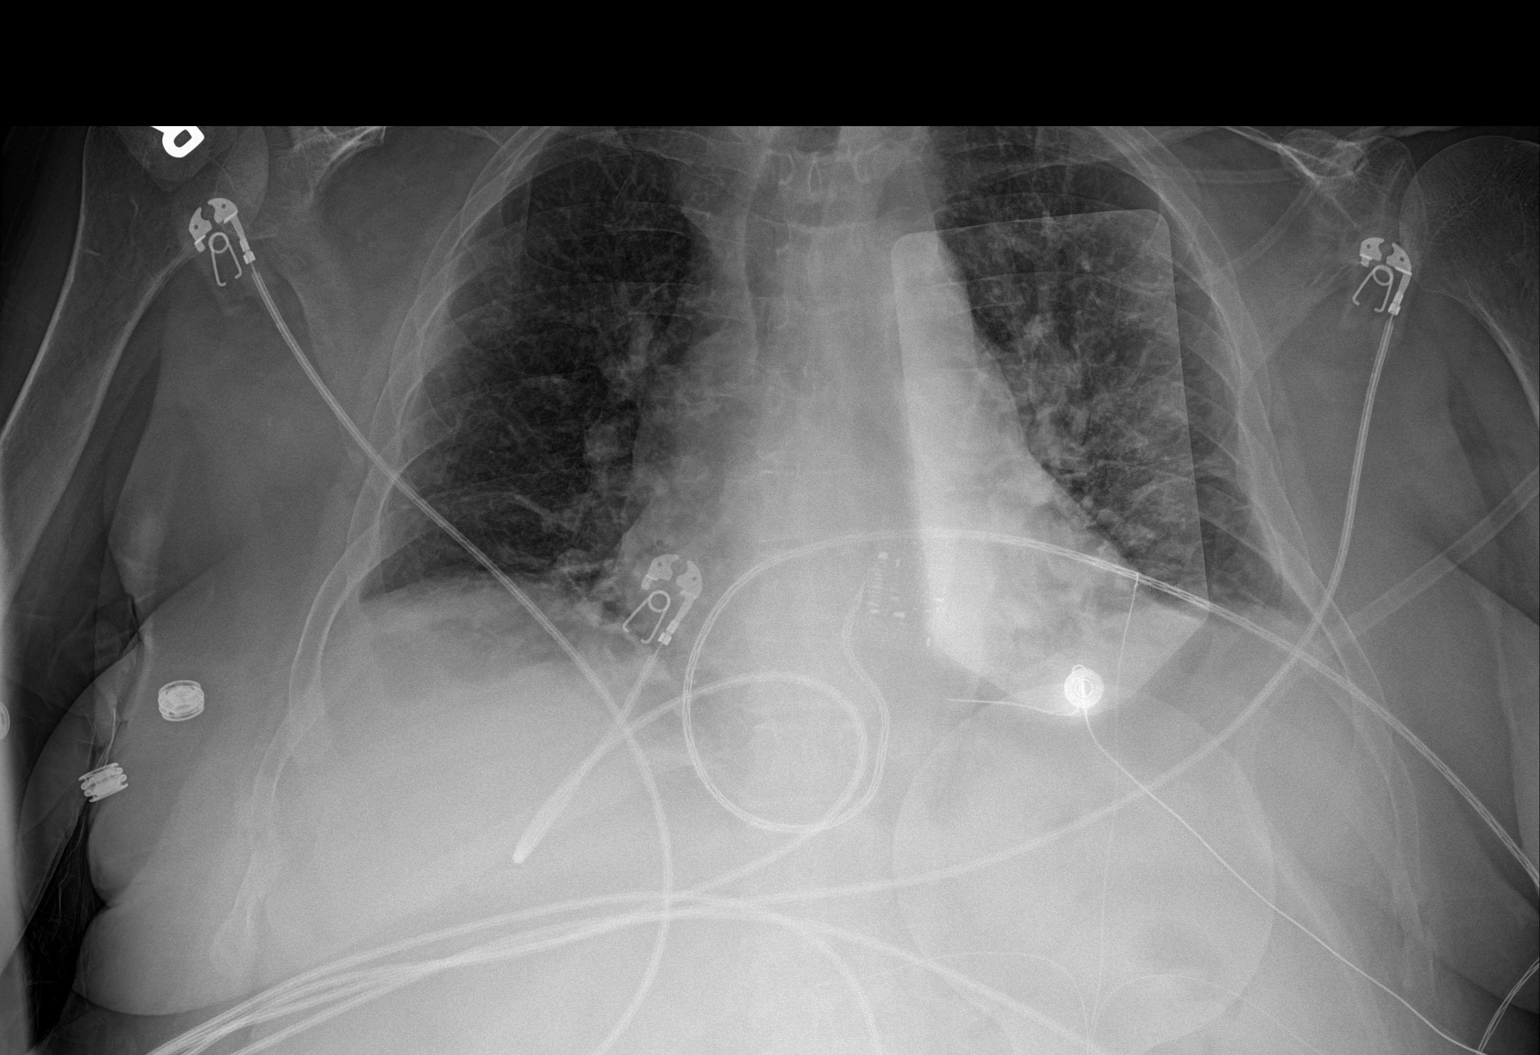

[1 of 1 positions shown; findings below may reference images not displayed]

FINDINGS: Persistent low lung volumes with bibasilar atelectasis. Similar
findings noted on prior exams. No pleural effusion or pneumothorax.
Heart size stable. No acute bony sclerotic that bony structures
appear stable.
IMPRESSION: Persistent low lung volumes with bibasilar atelectasis. Similar
findings noted on prior exams.

## 2021-10-12 IMAGING — DX DG CHEST 2V
2 series · 2 of 2 positions shown · non-contrast
Comparison: Chest x-rays dated 03/14/2020 and 02/29/2020.

CLINICAL DATA: Chest pain, lethargy.

EXAM:
CHEST - 2 VIEW

[chest lat]
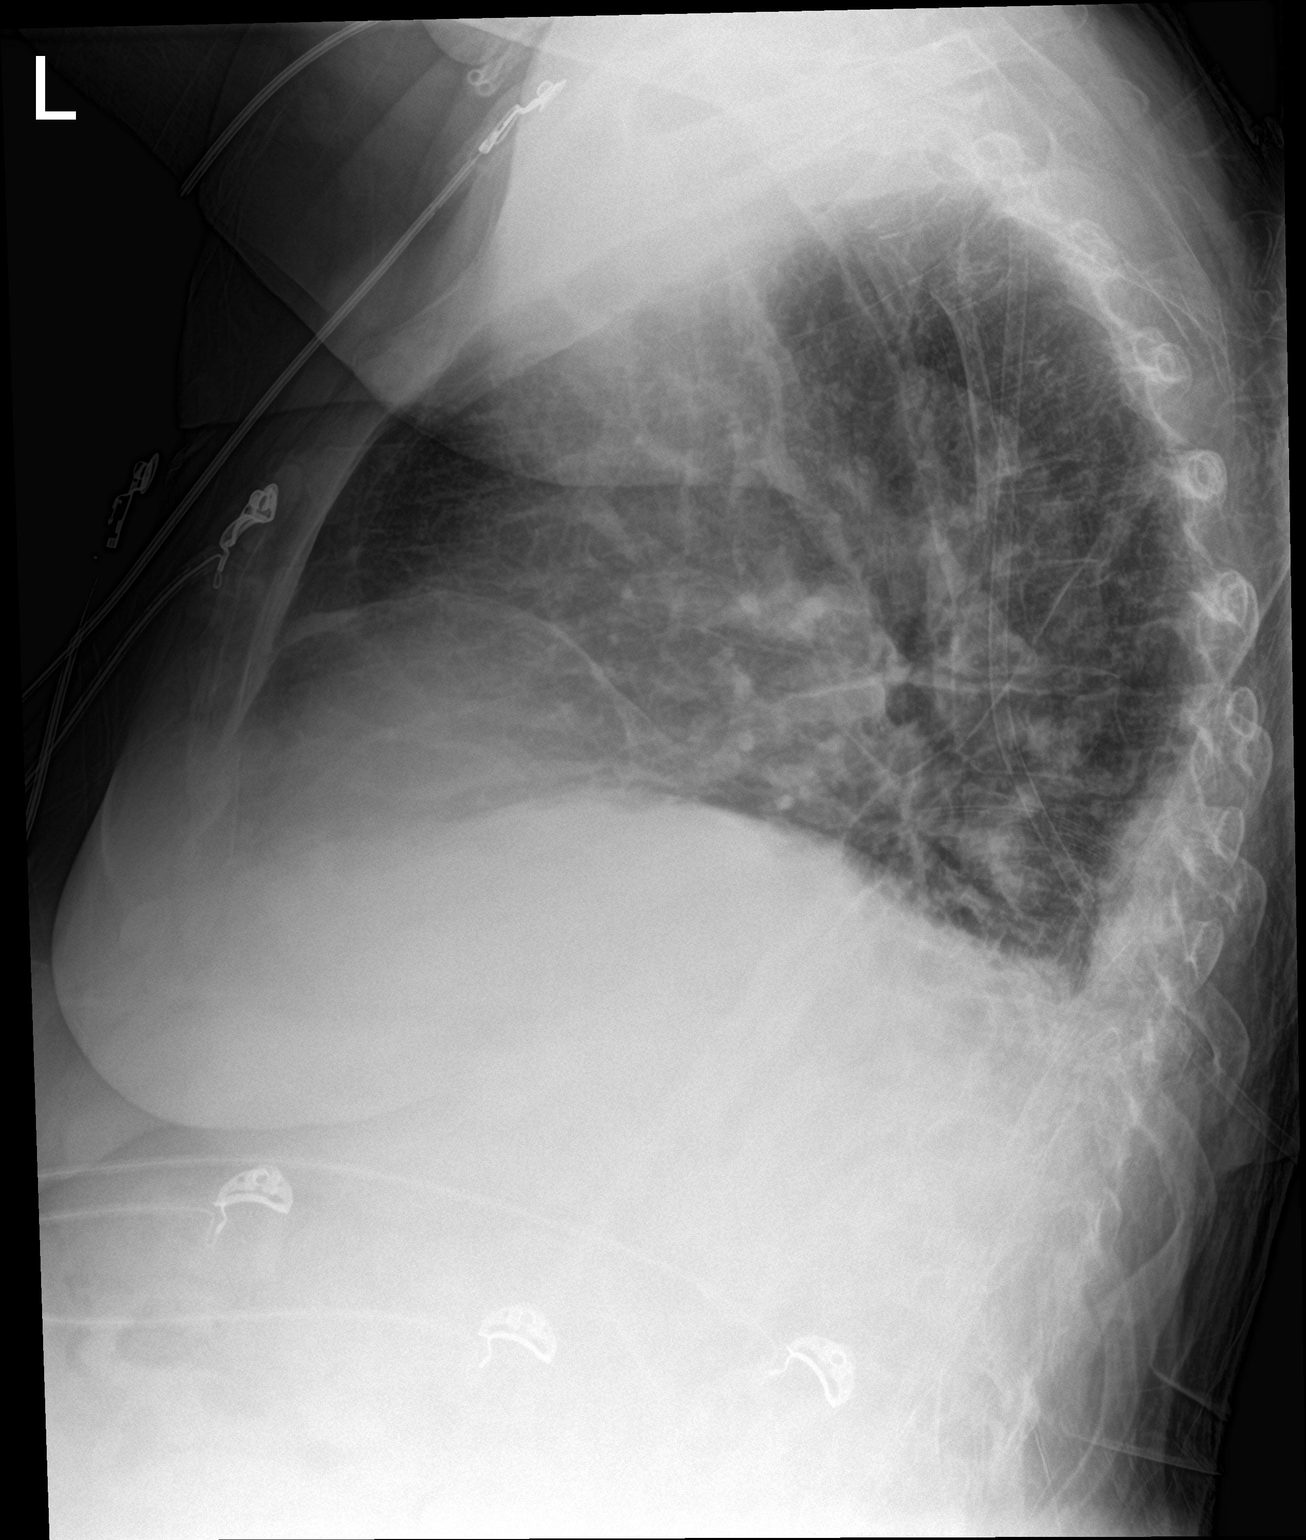

[chest ap]
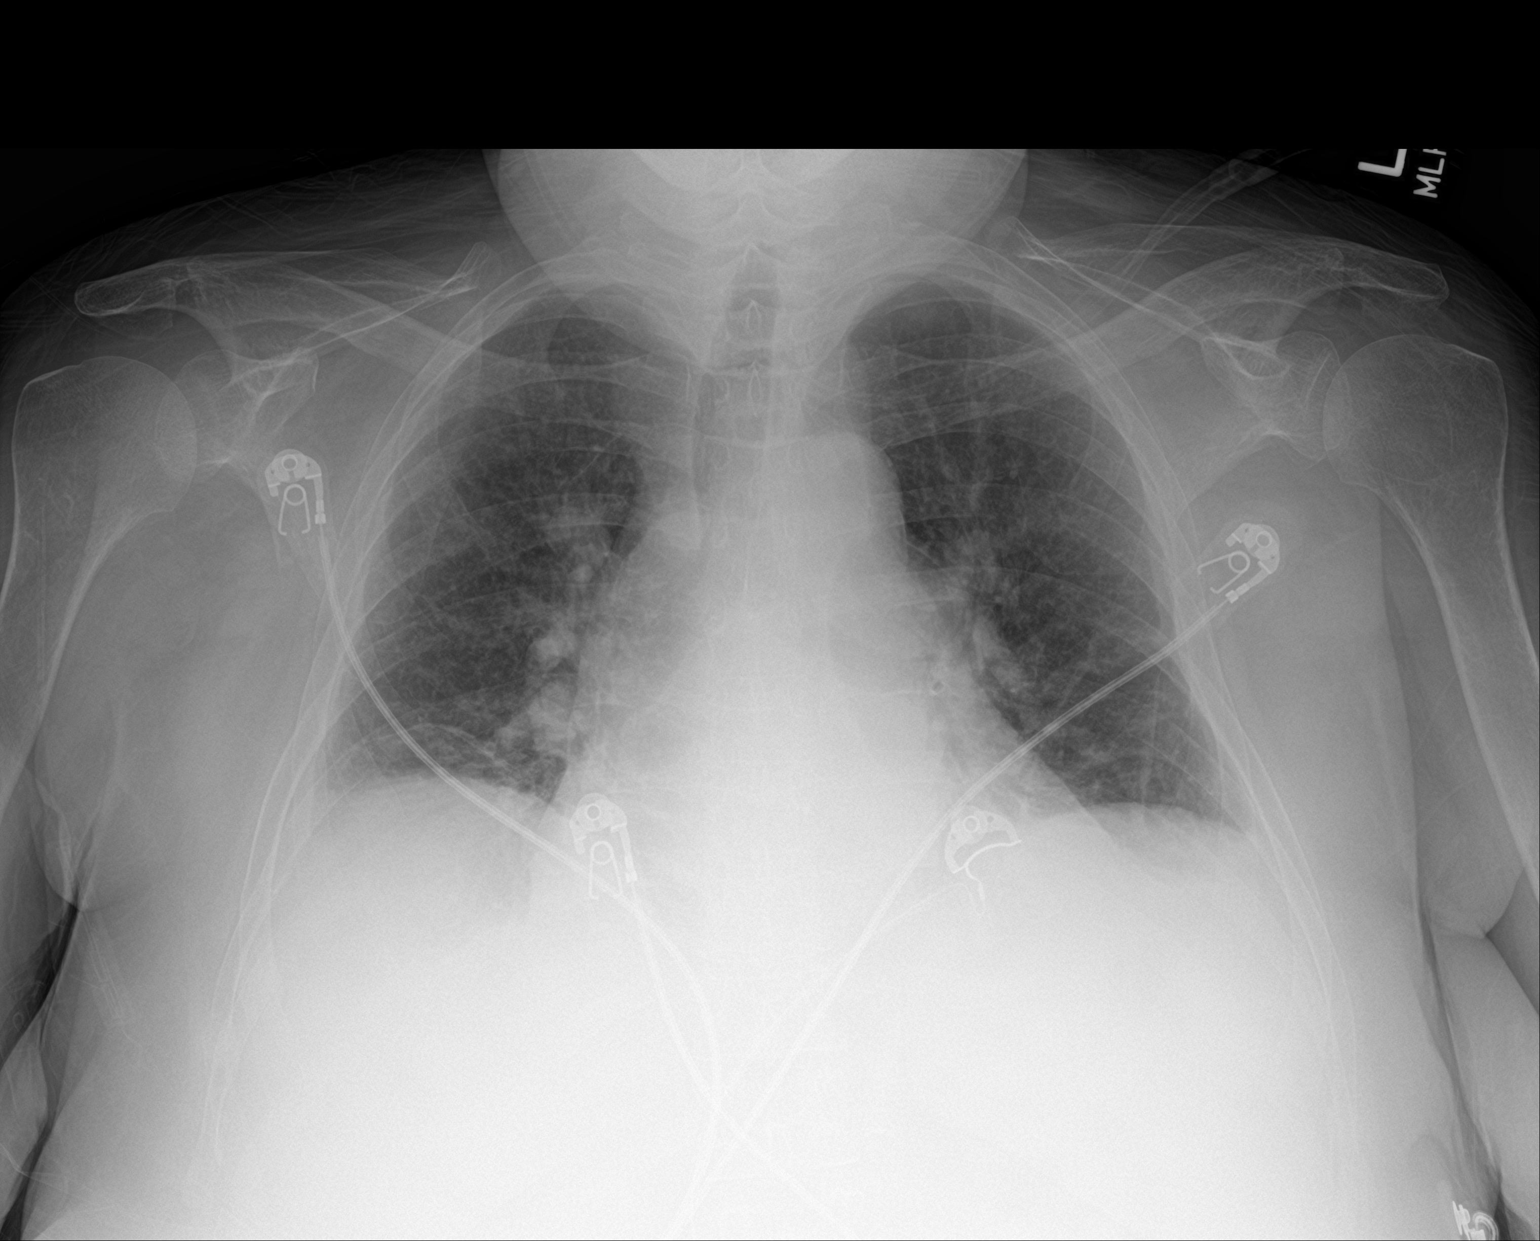

[2 of 2 positions shown; findings below may reference images not displayed]

FINDINGS: Stable mild cardiomegaly. No confluent opacity to suggest
consolidating pneumonia. No pleural effusion or pneumothorax is
seen. Osseous structures about the chest are unremarkable.
IMPRESSION: No active cardiopulmonary disease. No evidence of pneumonia or
pulmonary edema.

## 2021-10-20 IMAGING — DX DG CHEST 1V PORT
1 series · 1 of 1 positions shown · non-contrast
Comparison: Chest x-ray 04/07/2020.

CLINICAL DATA: 63-year-old female with history of trauma from a
fall complaining of left-sided rib pain.

EXAM:
PORTABLE CHEST 1 VIEW

[chest]
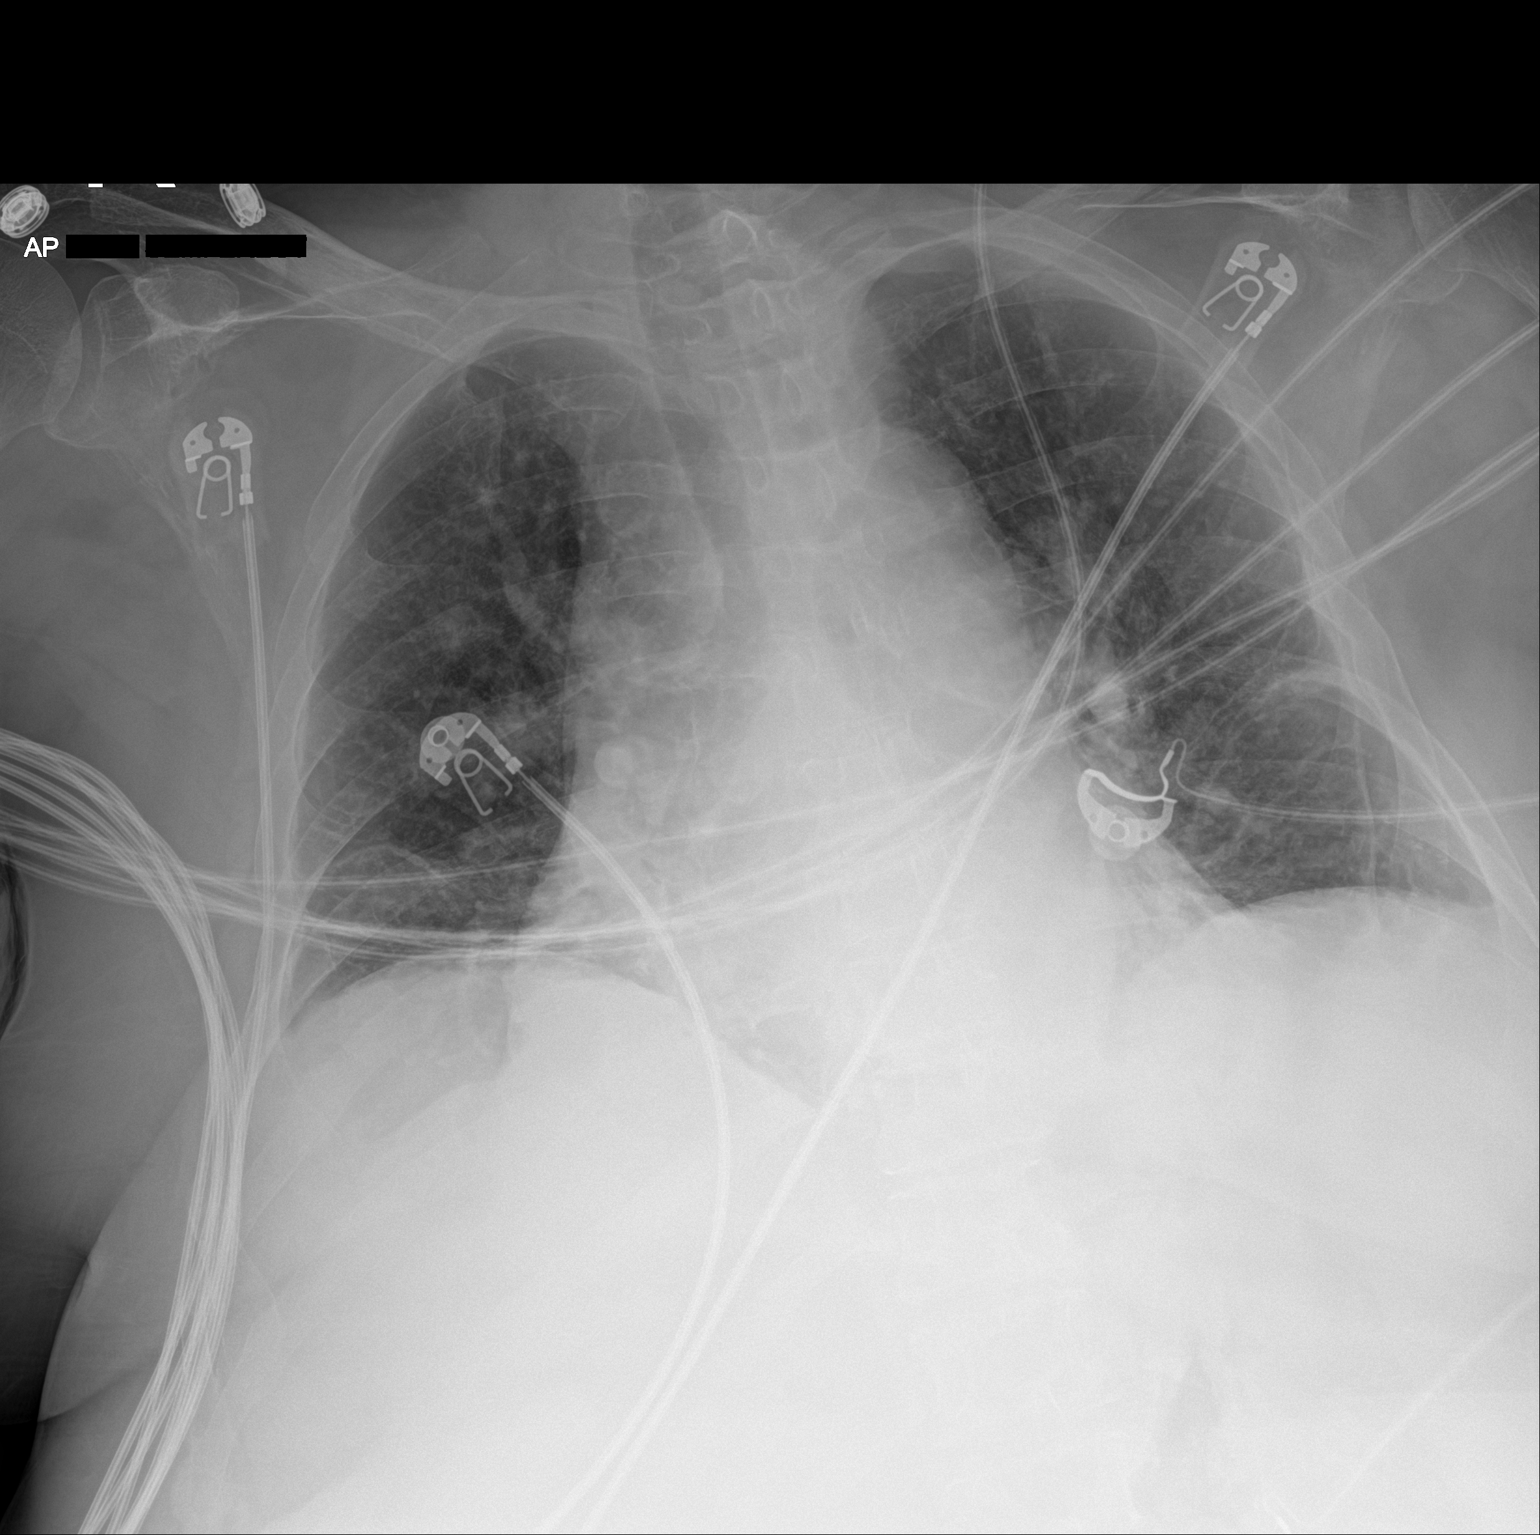

[1 of 1 positions shown; findings below may reference images not displayed]

FINDINGS: Lung volumes are low. Linear opacity in the left mid lung, new
compared to the recent prior study, presumably a focus of
subsegmental atelectasis. No acute consolidative airspace disease.
No pleural effusions. Cephalization of the pulmonary vasculature,
without frank pulmonary edema. Heart size is mildly enlarged. Upper
mediastinal contours are within normal limits. An old healed
left-sided rib fractures are noted. No definite acute displaced rib
fractures are identified.
IMPRESSION: 1. Low lung volumes with new area of subsegmental atelectasis in the
left mid lung.
2. Cardiomegaly with pulmonary venous congestion, but no frank
pulmonary edema.
3. Multiple old healed left-sided rib fractures, similar to prior
chest CT 07/27/2014.

## 2021-10-20 IMAGING — CT CT ABD-PELV W/O CM
1 series · 1 of 1 positions shown · non-contrast
Comparison: Radiograph earlier this day. Most recent chest CT
07/27/2014, most recent abdominal CT 03/31/2019

CLINICAL DATA: Worsening shortness of breath. Increased nausea and
vomiting. Fever. Patient reports with recent fall with left-sided
rib pain.

EXAM:
CT CHEST, ABDOMEN AND PELVIS WITHOUT CONTRAST
TECHNIQUE: Multidetector CT imaging of the chest, abdomen and pelvis was
performed following the standard protocol without IV contrast.

[Series 2: topogram 1.0 tr20 · coronal · 2.00mm/px · 1 of 1 slices shown]
[im 1/1]
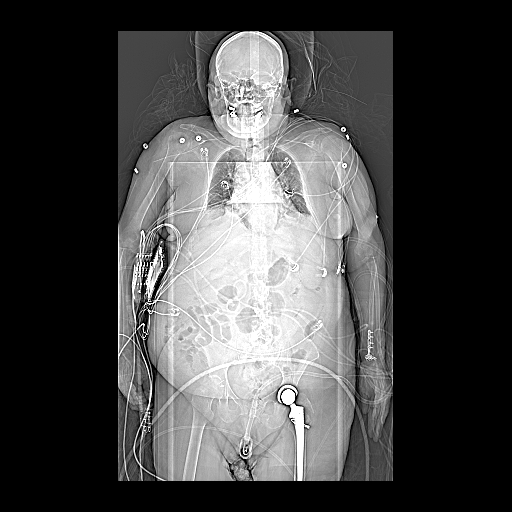

[1 of 1 positions shown; findings below may reference images not displayed]

FINDINGS: CT CHEST FINDINGS

Cardiovascular: No periaortic stranding to suggest injury. Dilated
main pulmonary artery at 4.1 cm. Small circumferential pericardial
effusion. Multi chamber cardiomegaly. There are prominent venous
collaterals anterior to the left axilla and in the included left
upper extremity. Decreased density of the blood pool suggesting
anemia.

Mediastinum/Nodes: No mediastinal hematoma. No pneumomediastinum.
Few scattered mediastinal lymph nodes that are not enlarged by size
criteria. Limited assessment for hilar adenopathy in the absence of
IV contrast. No esophageal wall thickening. Subcentimeter nodule in
the left lobe of the thyroid gland, does not need further evaluation
based on size.

Lungs/Pleura: Small bilateral pleural effusions. Compressive
atelectasis most prominent in the lower lobes. Additional
subsegmental atelectasis in the lingula and left upper lobe. No
pneumothorax. No evidence of pulmonary contusion. No pulmonary mass.
There is mild central bronchial thickening.

Musculoskeletal: Remote left anterior first and second rib
fractures. Remote anterior eighth, ninth, and tenth rib fractures
with callus. Remote posterior tenth rib fracture. No evidence of
acute rib fracture. Remote fractures of right anterior/lateral
second through ninth ribs. No acute right rib fracture. No sternal
fracture. No fracture of the thoracic spine, included clavicles or
shoulder girdles.

CT ABDOMEN PELVIS FINDINGS

Hepatobiliary: Multiple hepatic cysts. No evidence of [place]
injury or perihepatic hematoma. Layering hyperdensity in the
gallbladder is likely sludge. No pericholecystic inflammation. No
biliary dilatation.

Pancreas: Pancreas is unremarkable. No peripancreatic inflammation.
No evidence of pancreatic injury or pancreatic cyst.

Spleen: Splenomegaly with spleen spanning 15.4 cm cranial caudal. No
perisplenic hematoma or focal abnormality.

Adrenals/Urinary Tract: No adrenal nodule. Polycystic kidneys which
are enlarged with innumerable cysts of varying sizes. Transplant
kidney in the right iliac fossa. There is no transplant
hydronephrosis. No evidence of renal injury. Urinary bladder is only
minimally distended, partially obscured by streak artifact from left
hip arthroplasty. No obvious focal bladder abnormality.

Stomach/Bowel: Sigmoid colonic diverticulosis without
diverticulitis. Mild colonic tortuosity. Small volume of colonic
stool. Appendix is normal. Stomach and small bowel are unremarkable.
No obstruction, inflammatory change, or evidence of injury.

Vascular/Lymphatic: Aorto bi-iliac atherosclerosis. No aortic
aneurysm. No retroperitoneal fluid. No adenopathy.

Reproductive: Uterus partially obscured by streak artifact from left
hip arthroplasty. No suspicious adnexal mass.

Other: Small amount of fluid in the left inguinal canal is unchanged
from 12/20/2019 CT. No ascites or free fluid in the abdomen. No free
air. There is a small fat containing umbilical hernia.

Musculoskeletal: Left hip arthroplasty. The bones are under
mineralized. Remote left superior and inferior pubic rami fractures.
Remote bilateral sacral insufficiency fractures. No evidence of
acute fracture.
IMPRESSION: 1. No evidence of acute traumatic injury in the chest, abdomen, or
pelvis.
2. Small bilateral pleural effusions with compressive atelectasis.
3. Multi chamber cardiomegaly. Small circumferential pericardial
effusion. Dilated main pulmonary artery suggesting pulmonary
arterial hypertension.
4. No acute findings in the abdomen/pelvis.
5. Polycystic kidneys and liver. Transplant kidney in the right
iliac fossa without transplant hydronephrosis.
6. Splenomegaly.
7. Colonic diverticulosis without diverticulitis.
8. Remote bilateral rib fractures.  No acute rib fracture.

Aortic Atherosclerosis ([id number]).

## 2021-10-20 IMAGING — CT CT HEAD W/O CM
4 series · 17 of 47 positions shown, 19 images · non-contrast
Comparison: April 19, 2019

CLINICAL DATA: Status post trauma.

EXAM:
CT HEAD WITHOUT CONTRAST
TECHNIQUE: Contiguous axial images were obtained from the base of the skull
through the vertex without intravenous contrast.

[Series 1: head wo · axial · 0.40mm/px · z∈[-62,+58]mm · 7 of 33 slices shown, 9 images]
[im 5/33  brain]
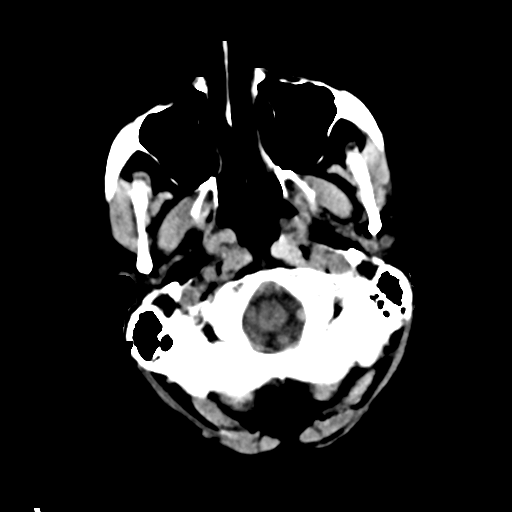
[im 5/33  bone]
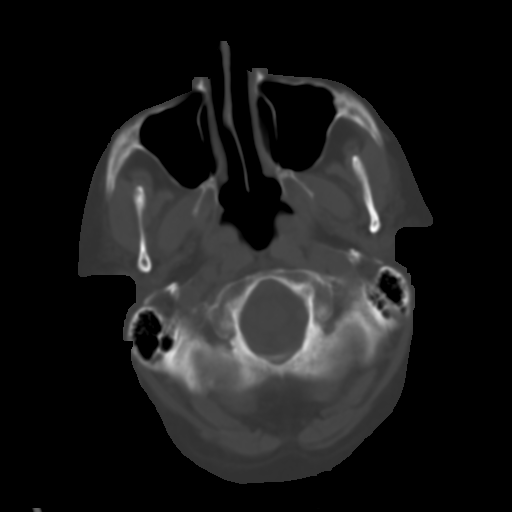
[im 9/33  brain]
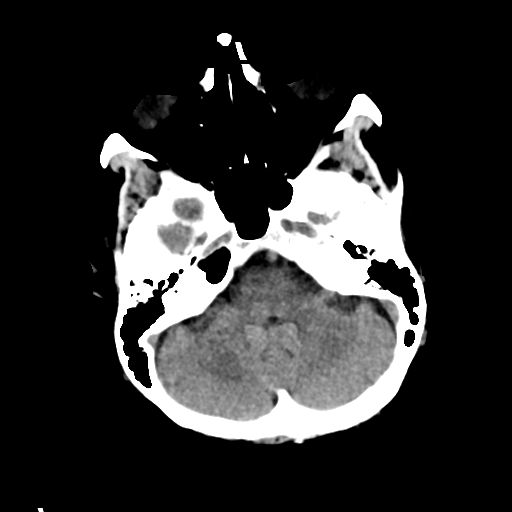
[im 13/33  brain]
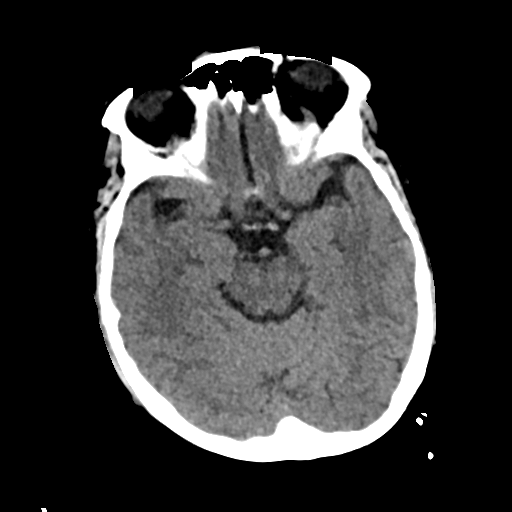
[im 17/33  brain]
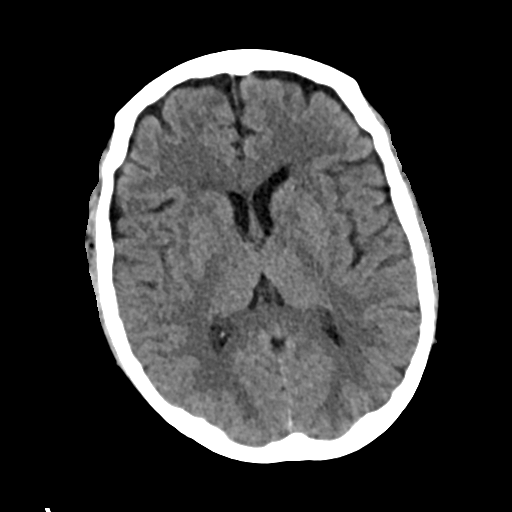
[im 21/33  brain]
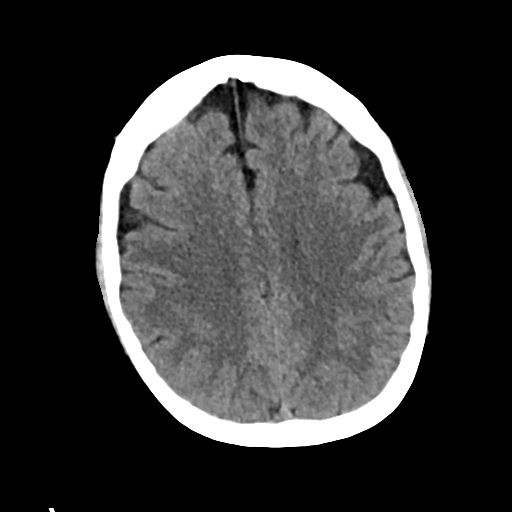
[im 21/33  bone]
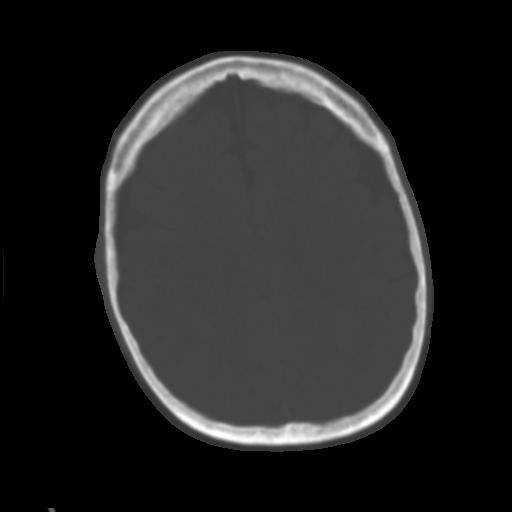
[im 25/33  brain]
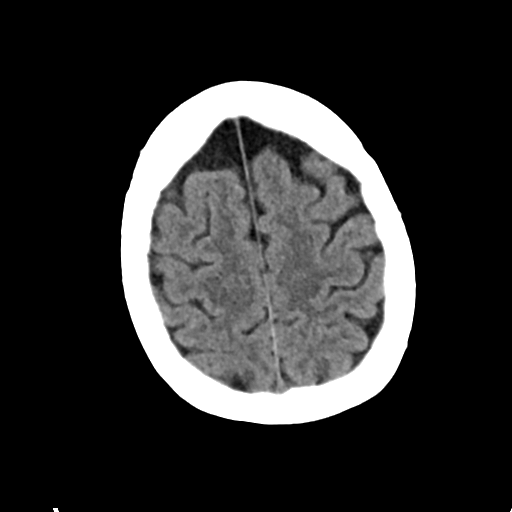
[im 29/33  brain]
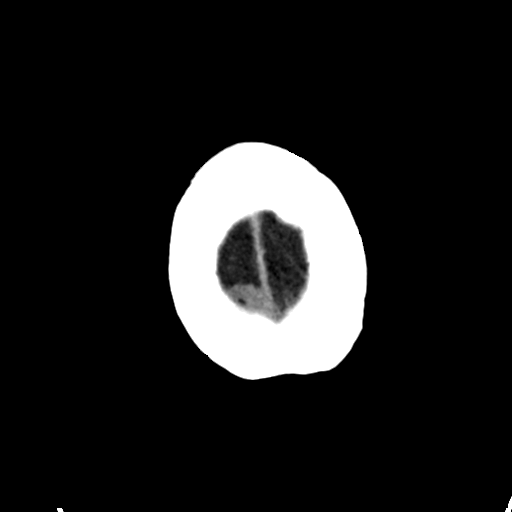

[Series 4: head bone · axial · 0.40mm/px · z∈[-66,-10]mm · 4 of 81 slices shown]
[im 9/81  bone]
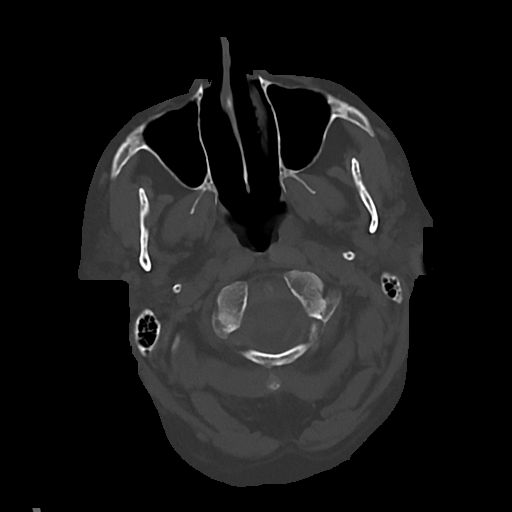
[im 17/81  bone]
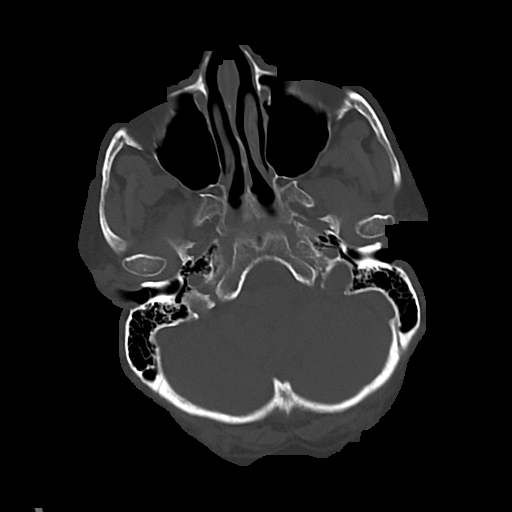
[im 25/81  bone]
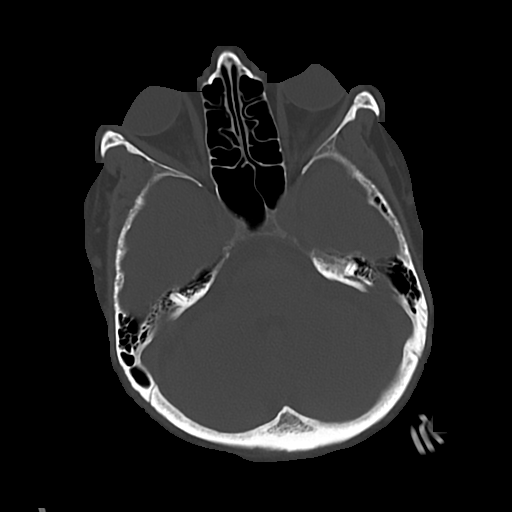
[im 37/81  bone]
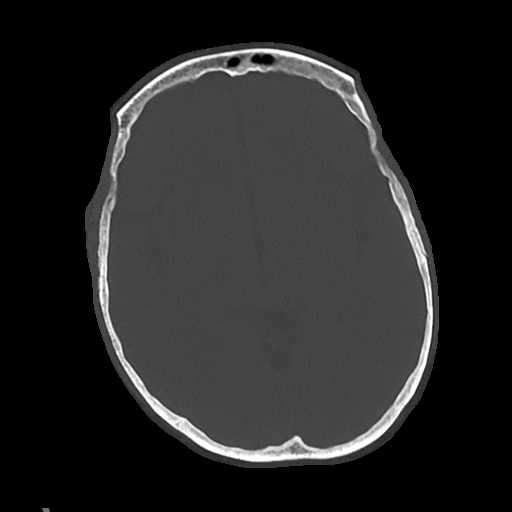

[Series 5: cor soft · coronal · 0.31mm/px · 3 of 69 slices shown]
[im 23/69  brain]
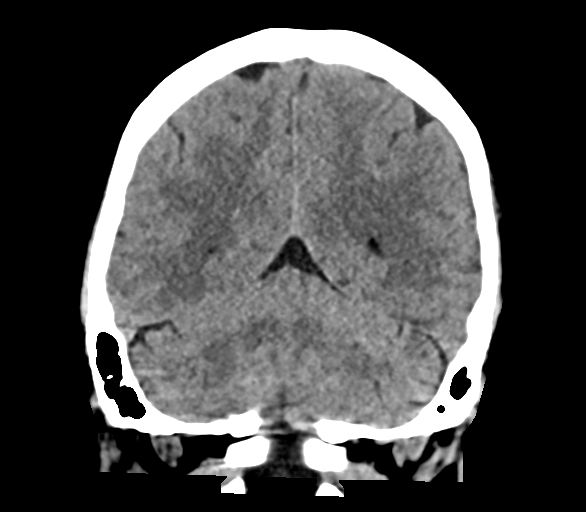
[im 31/69  brain]
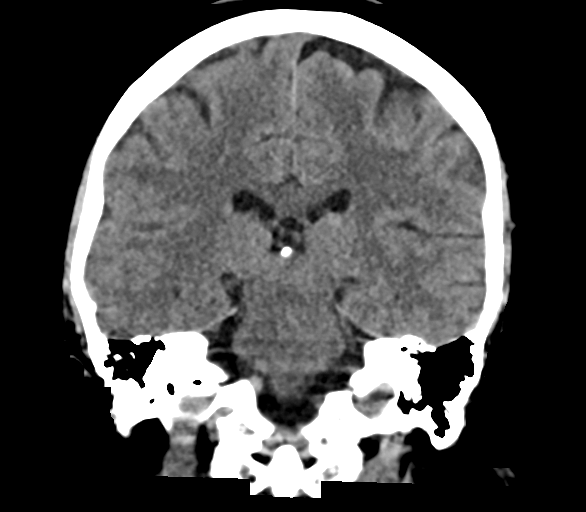
[im 38/69  brain]
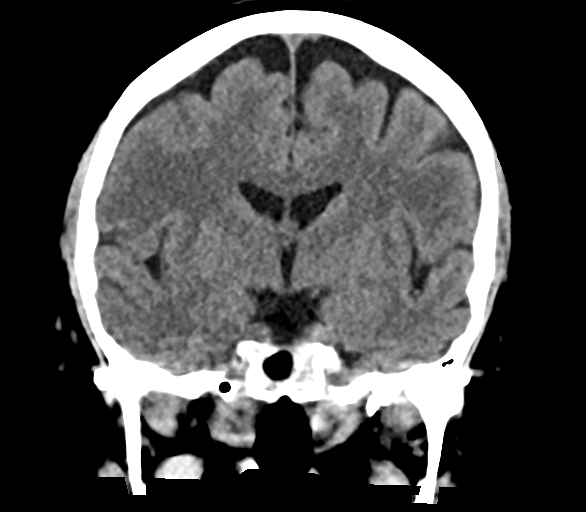

[Series 6: sag soft · sagittal · 0.31mm/px · 3 of 60 slices shown]
[im 20/60  brain]
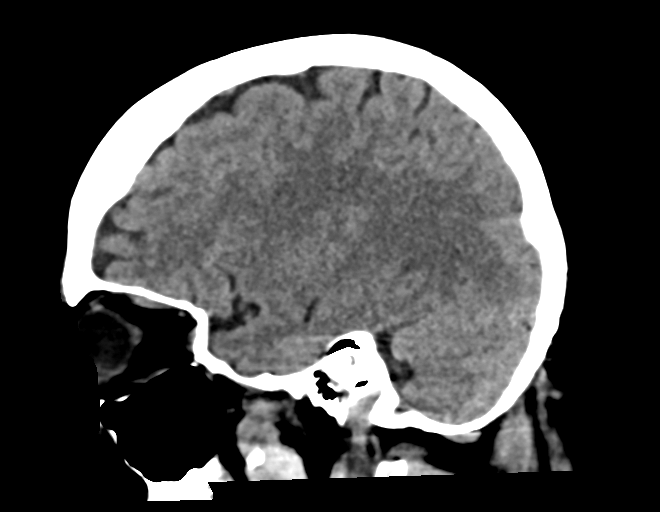
[im 30/60  brain]
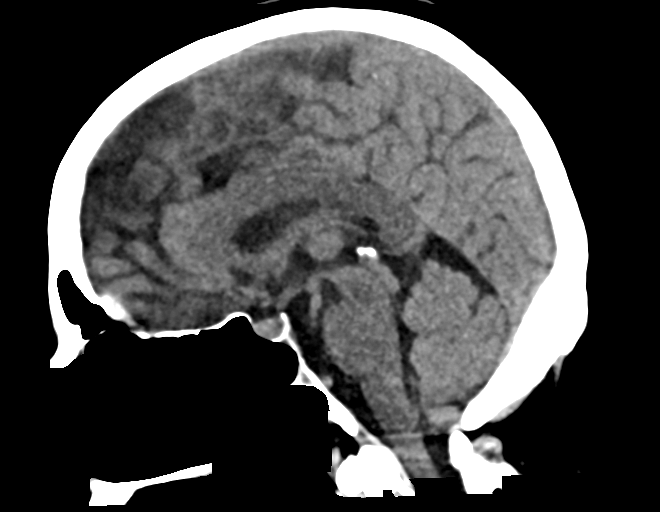
[im 40/60  brain]
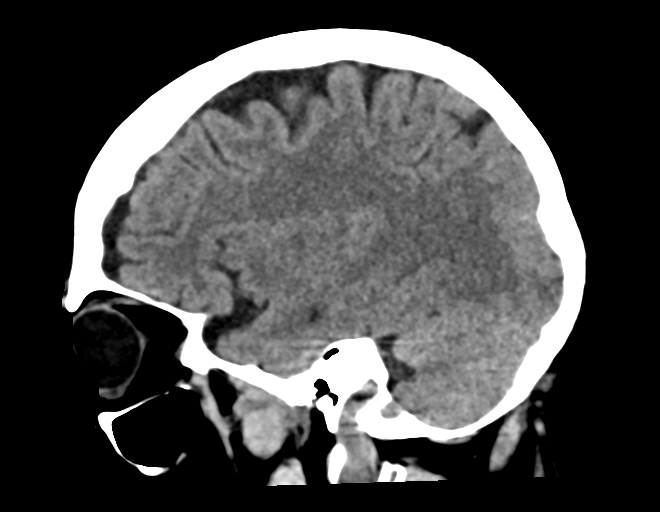

[17 of 47 positions shown; findings below may reference images not displayed]

FINDINGS: Brain: No evidence of acute infarction, hemorrhage, hydrocephalus,
extra-axial collection or mass lesion/mass effect.

Vascular: No hyperdense vessel or unexpected calcification.

Skull: Normal. Negative for fracture or focal lesion.

Sinuses/Orbits: No acute finding.

Other: None.
IMPRESSION: No acute intracranial pathology.

## 2021-10-20 IMAGING — CT CT L SPINE W/O CM
3 series · 12 of 33 positions shown, 14 images · non-contrast
Comparison: Lumbar spine radiographs 12/19/2019. CT pelvis
12/20/2019.

CLINICAL DATA: Back pain. Recent fall.

EXAM:
CT LUMBAR SPINE WITHOUT CONTRAST
TECHNIQUE: Multidetector CT imaging of the lumbar spine was performed without
intravenous contrast administration. Multiplanar CT image
reconstructions were also generated.

[Series 1: l spine axial · axial · 0.29mm/px · z∈[-577,-419]mm · 4 of 115 slices shown, 5 images]
[im 18/115  soft-tissue]
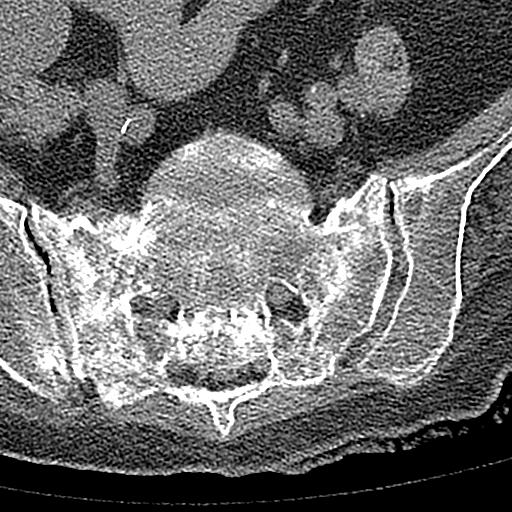
[im 18/115  bone]
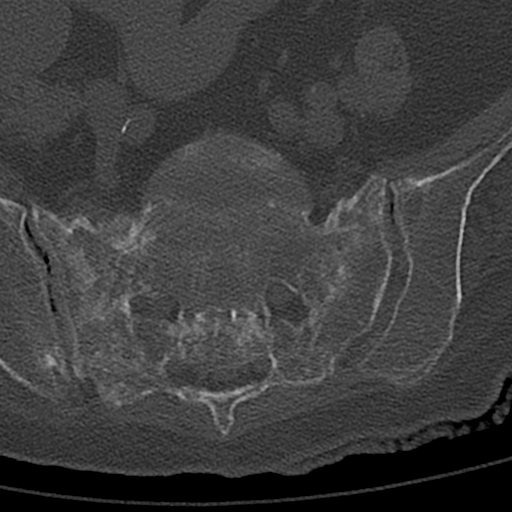
[im 44/115  bone]
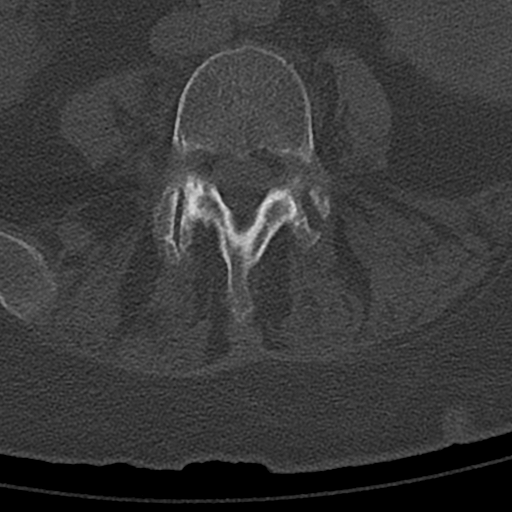
[im 71/115  bone]
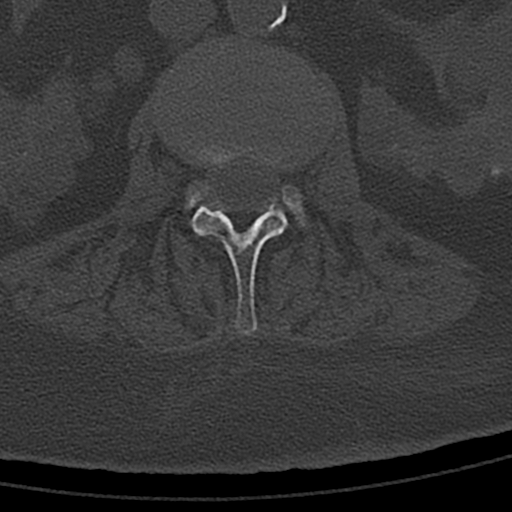
[im 97/115  bone]
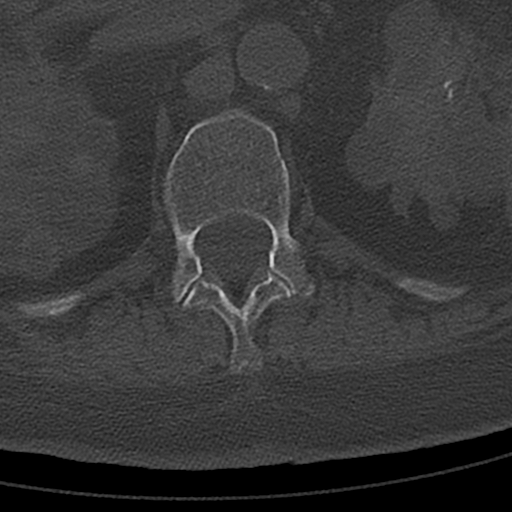

[Series 4: l spine cor · coronal · 0.34mm/px · 3 of 85 slices shown]
[im 17/85  bone]
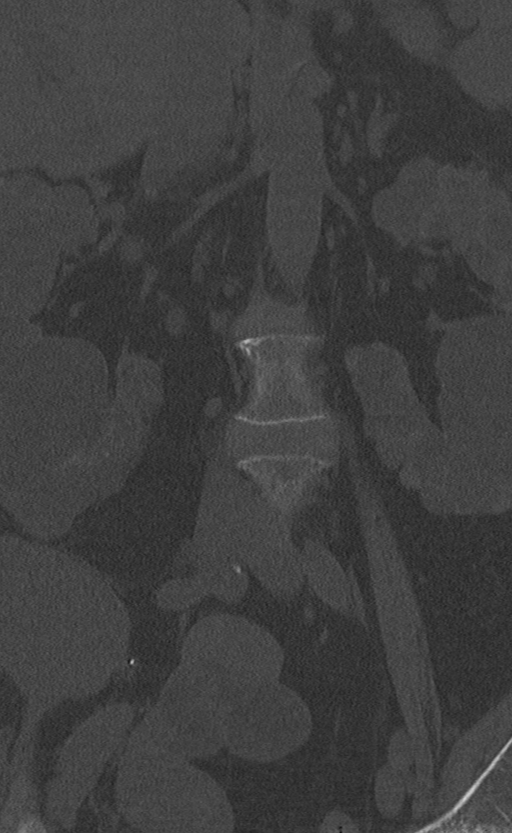
[im 34/85  bone]
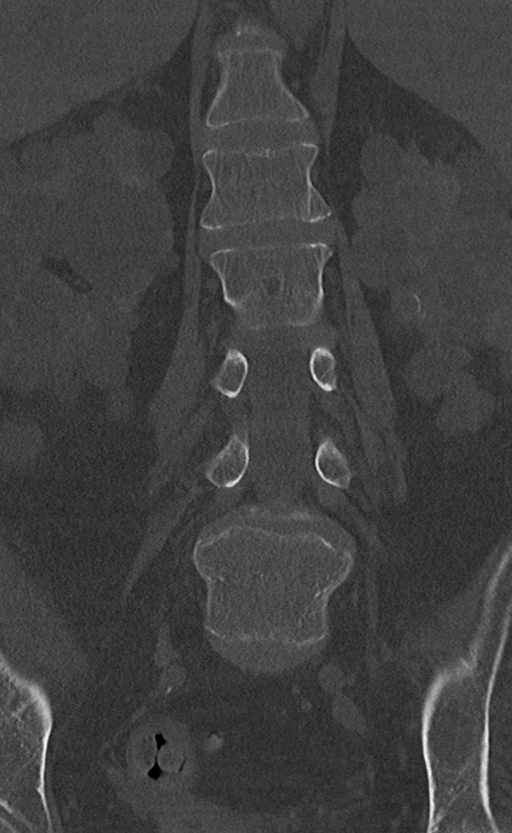
[im 51/85  bone]
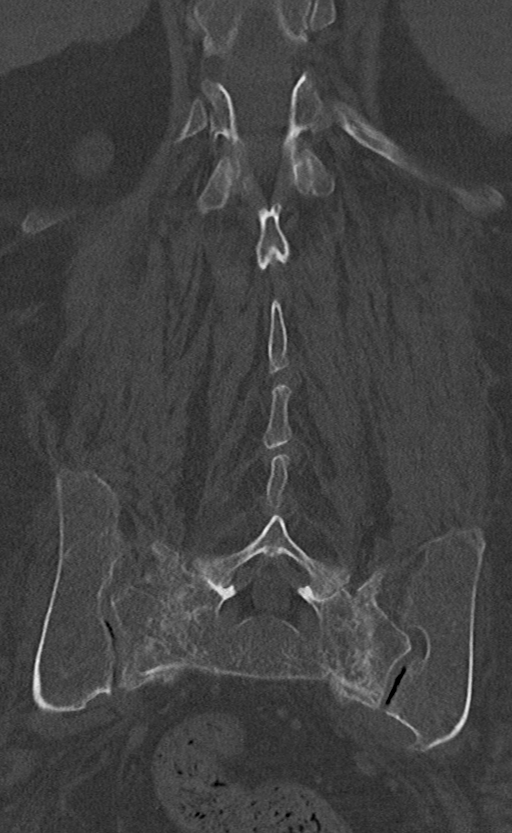

[Series 5: l spine sag · sagittal · 0.33mm/px · 5 of 80 slices shown, 6 images]
[im 27/80  bone]
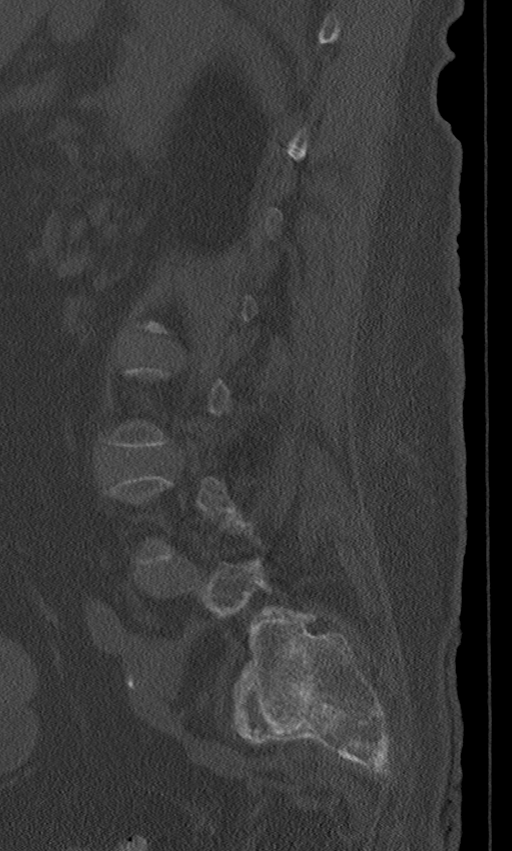
[im 33/80  bone]
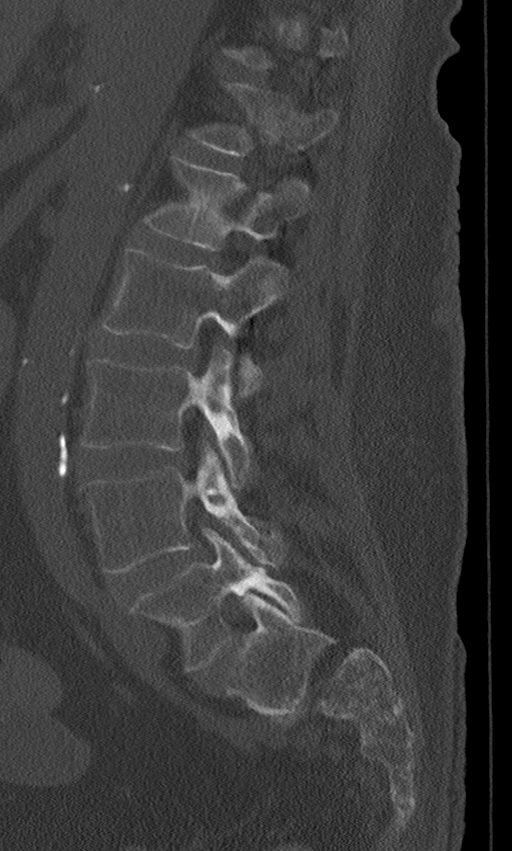
[im 40/80  soft-tissue]
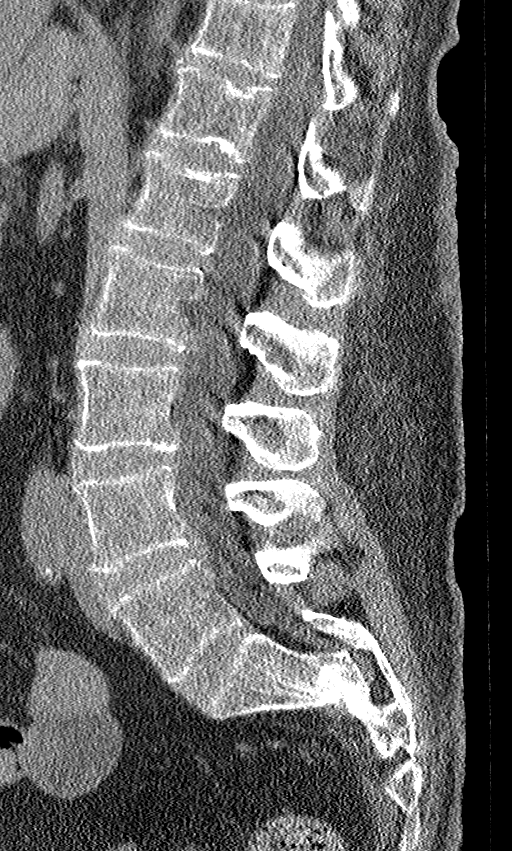
[im 40/80  bone]
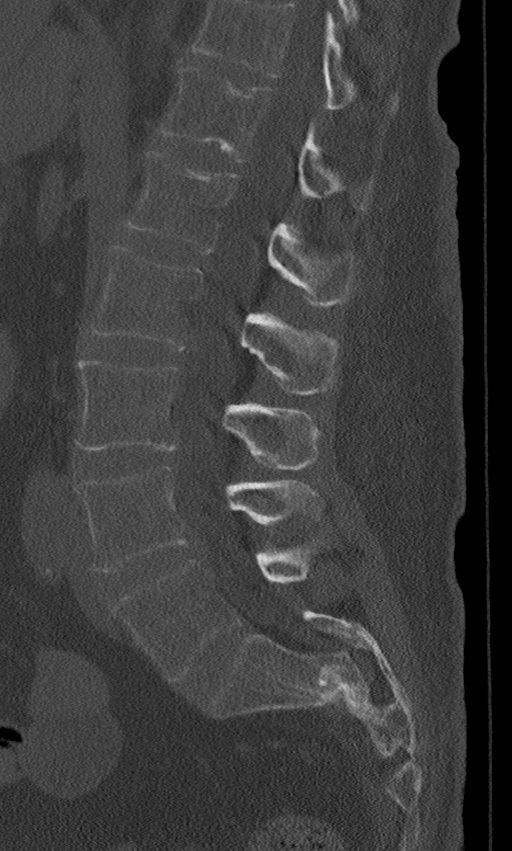
[im 47/80  bone]
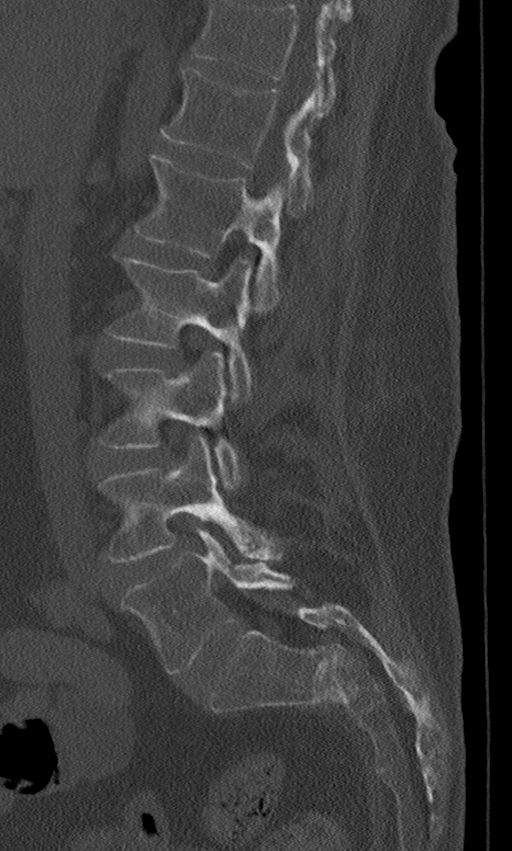
[im 53/80  bone]
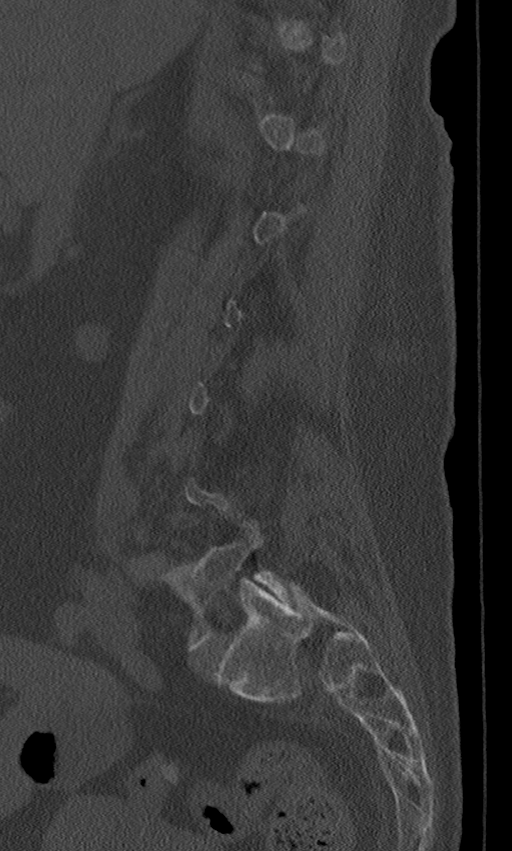

[12 of 33 positions shown; findings below may reference images not displayed]

FINDINGS: Segmentation: 5 lumbar type vertebrae.

Alignment: Normal.

Vertebrae: Chronic bilateral sacral fractures with sclerosis and
angular deformity of the sacrum anteriorly at the S2 level. Chronic
bilateral L5 transverse process fractures. No acute lumbar spine
fracture. Small Schmorl's nodes at T11-12 and T12-L1.

Paraspinal and other soft tissues: No acute paraspinal soft tissue
abnormality identified. Intra-abdominal and pelvic contents reported
separately.

Disc levels: Intervertebral disc space heights are preserved in the
lumbar spine. Disc bulging and severe facet hypertrophy at L4-5
result in mild right greater than left neural foraminal stenosis
without evidence of significant spinal stenosis. There is mild disc
bulging at L3-4 with minimal to mild right neural foraminal
stenosis.
IMPRESSION: 1. No acute osseous abnormality identified in the lumbar spine.
2. Chronic bilateral sacral fractures and chronic bilateral L5
transverse process fractures.
3. Severe L4-5 facet hypertrophy with mild right greater than left
neural foraminal stenosis.

## 2021-10-20 IMAGING — CR DG HIP (WITH OR WITHOUT PELVIS) 2-3V*L*
3 series · 3 of 3 positions shown · non-contrast
Comparison: February 01, 2020

CLINICAL DATA: Status post fall 3 days ago.

EXAM:
DG HIP (WITH OR WITHOUT PELVIS) 2-3V LEFT

[pelvis ap]
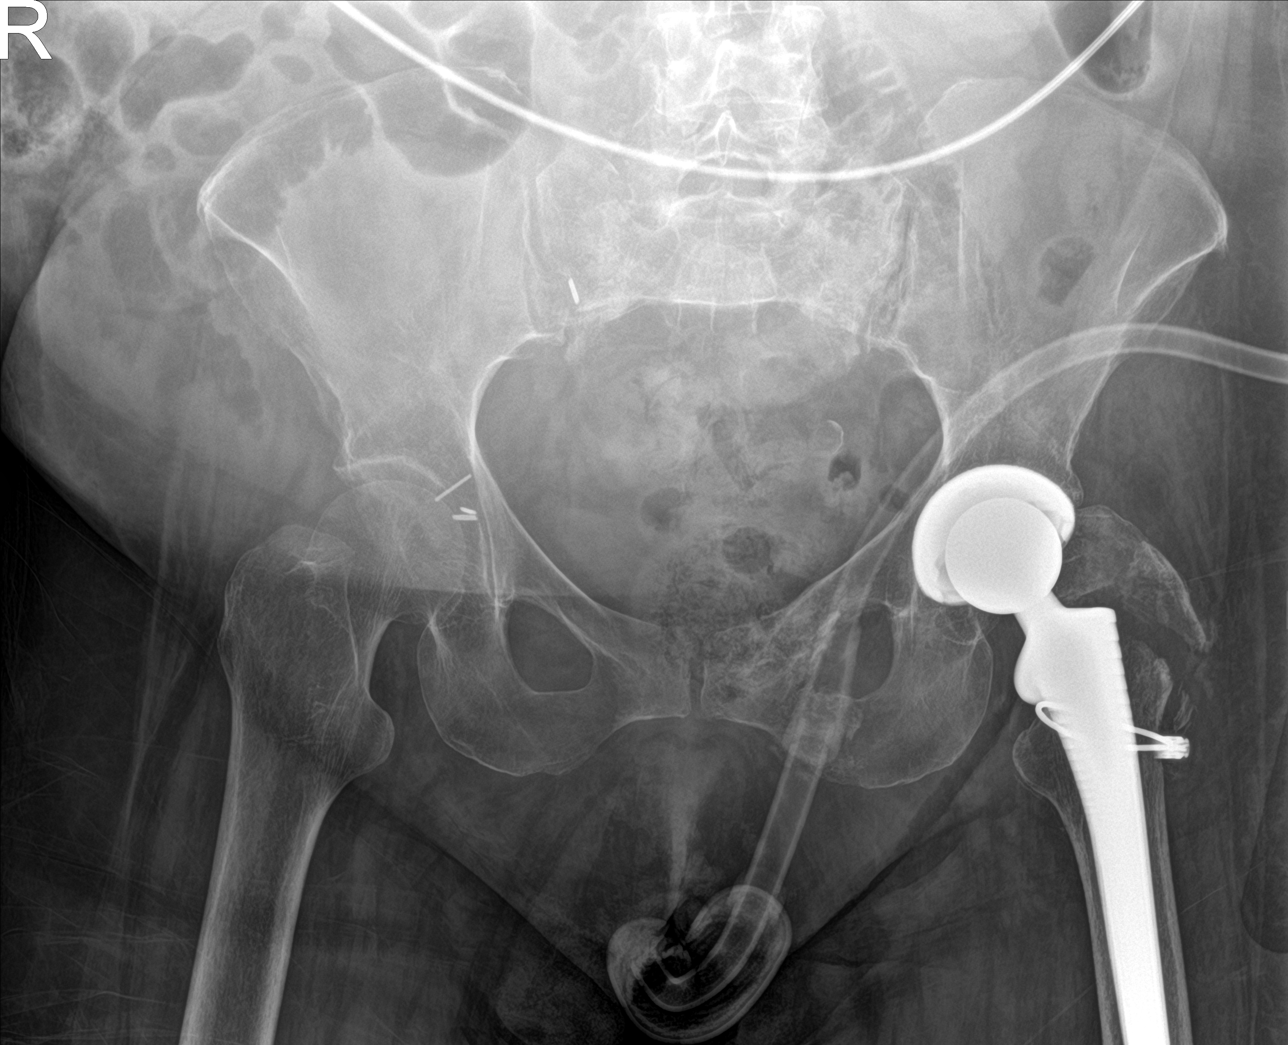

[hip ap]
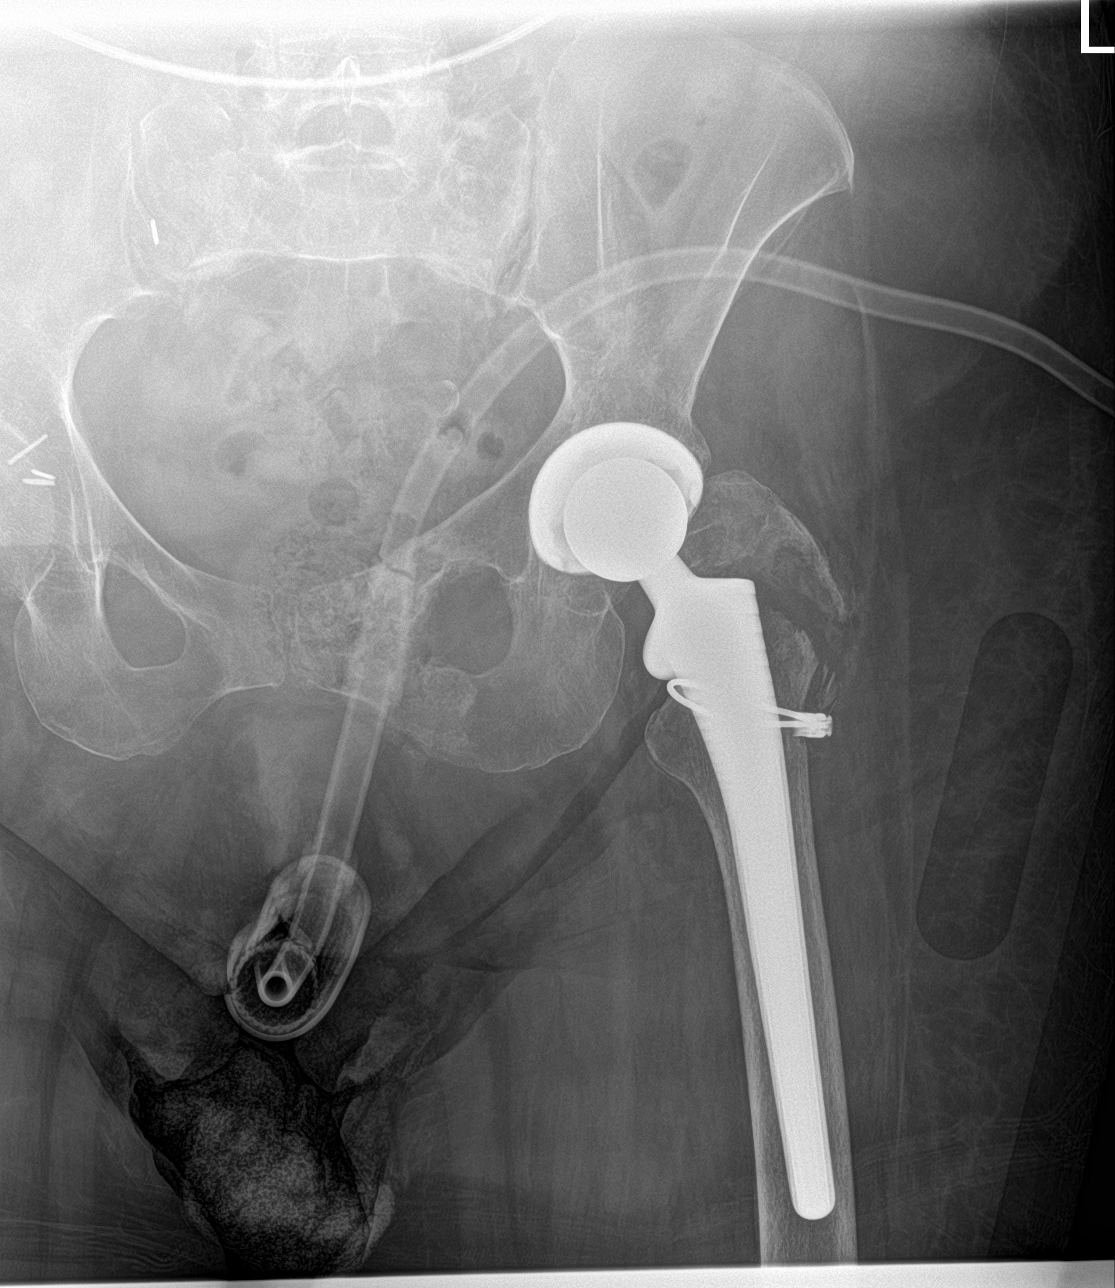

[hip lat]
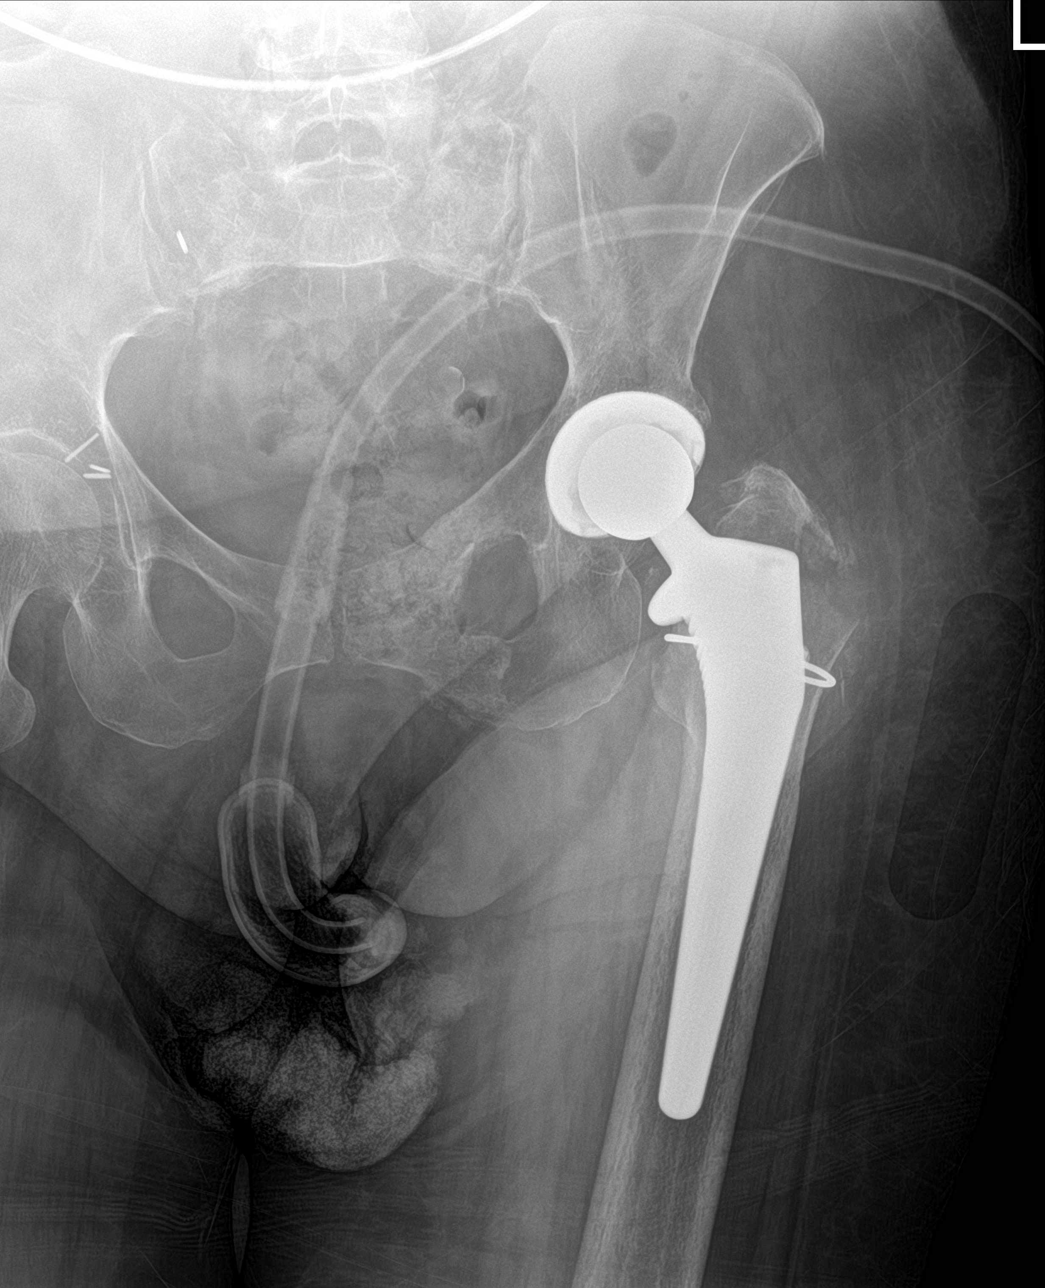

[3 of 3 positions shown; findings below may reference images not displayed]

FINDINGS: There is no evidence of an acute hip fracture or dislocation.
Chronic fractures of the left superior and left inferior pubic rami
are present. Fragmentation of the left greater trochanter is again
noted. A total left hip replacement is seen. There is no evidence of
surrounding lucency to suggest the presence of hardware loosening or
infection. There is no evidence of arthropathy or other focal bone
abnormality. Radiopaque surgical clips are seen overlying the right
acetabulum and inferior aspect of the sacrum on the right.
IMPRESSION: 1. No evidence of an acute osseous abnormality.
2. Prior total left hip replacement without evidence of hardware
complication.

## 2021-10-20 IMAGING — CT CT CHEST W/O CM
2 of 4 series · 13 of 36 positions shown, 16 images · non-contrast
Comparison: Radiograph earlier this day. Most recent chest CT
07/27/2014, most recent abdominal CT 03/31/2019

CLINICAL DATA: Worsening shortness of breath. Increased nausea and
vomiting. Fever. Patient reports with recent fall with left-sided
rib pain.

EXAM:
CT CHEST, ABDOMEN AND PELVIS WITHOUT CONTRAST
TECHNIQUE: Multidetector CT imaging of the chest, abdomen and pelvis was
performed following the standard protocol without IV contrast.

[Series 3: cap without · axial · non-contrast · 0.78mm/px · z∈[-711,-181]mm · 10 of 126 slices shown, 13 images]
[im 10/126  mediastinal]
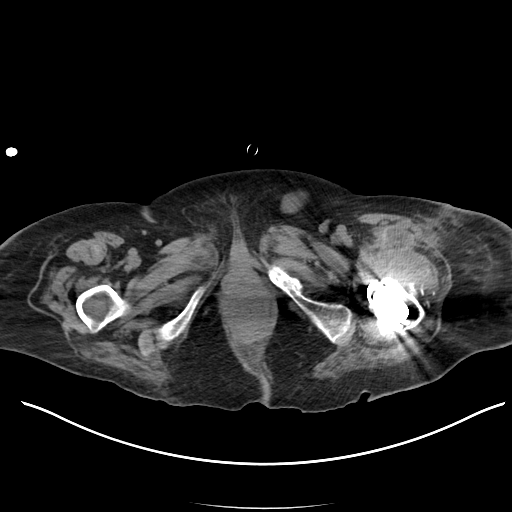
[im 10/126  lung]
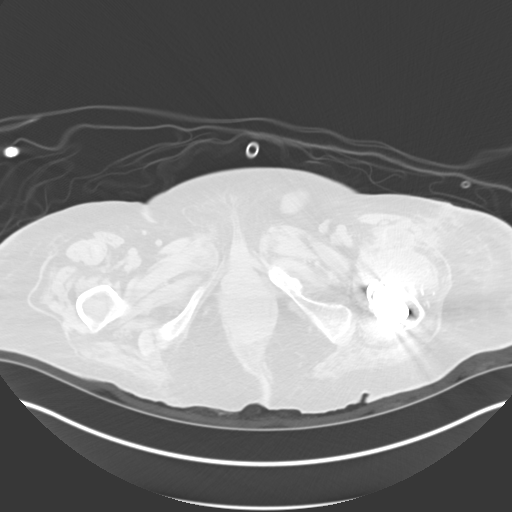
[im 20/126  lung]
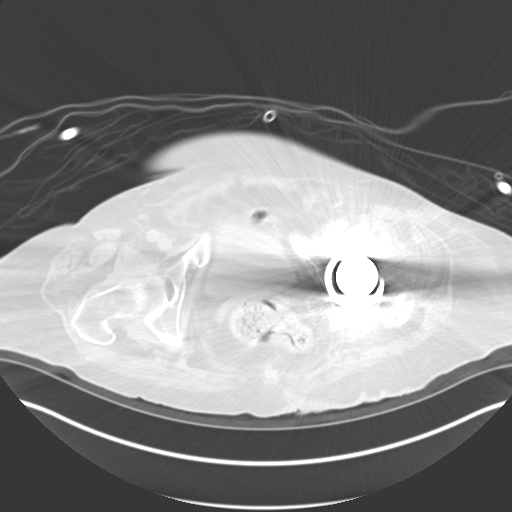
[im 39/126  lung]
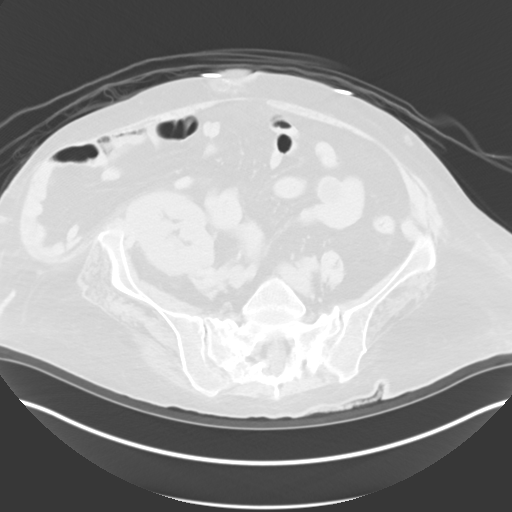
[im 49/126  lung]
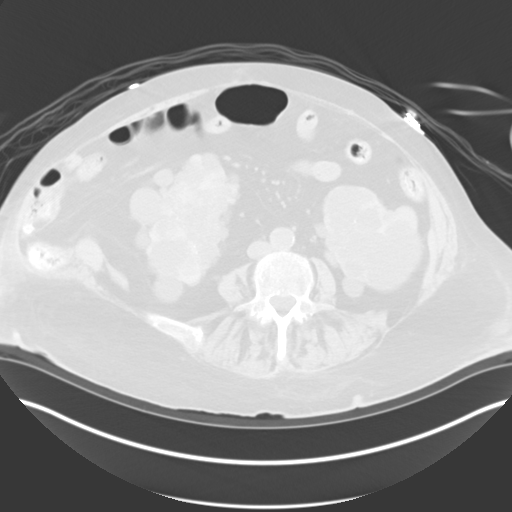
[im 58/126  mediastinal]
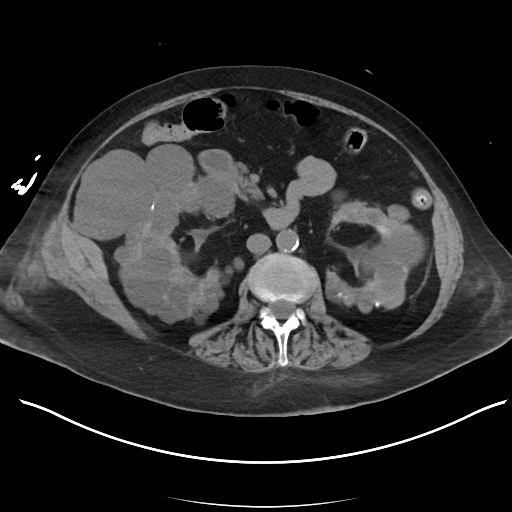
[im 58/126  lung]
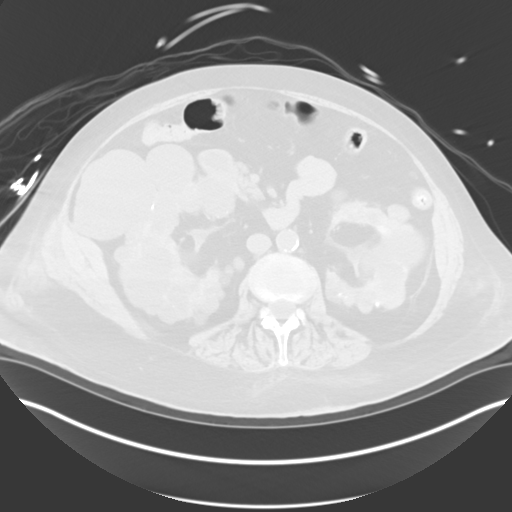
[im 68/126  lung]
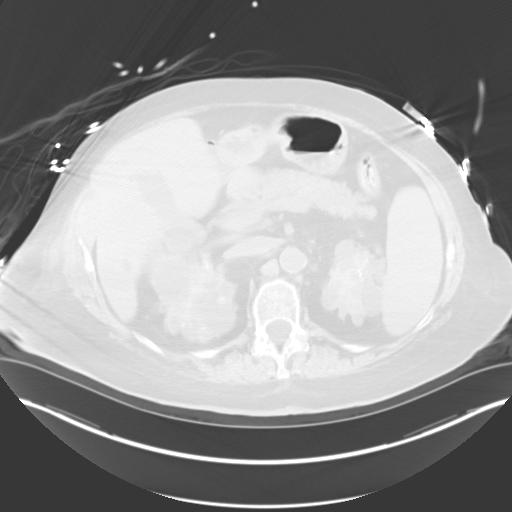
[im 77/126  lung]
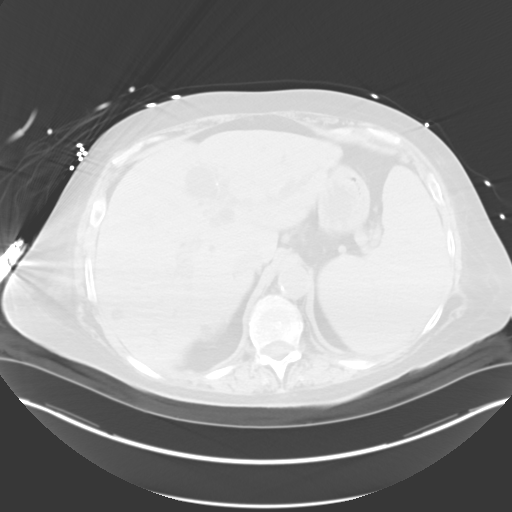
[im 97/126  lung]
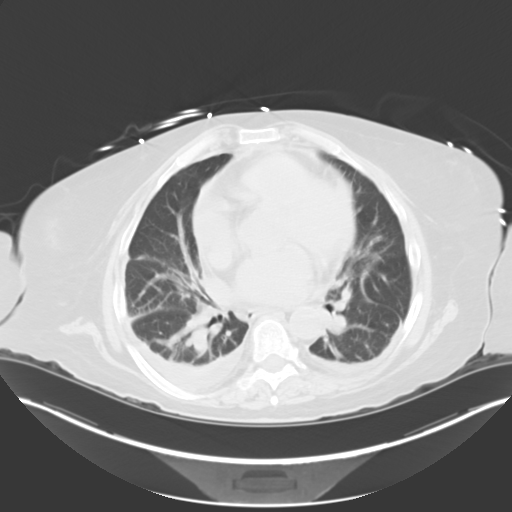
[im 106/126  mediastinal]
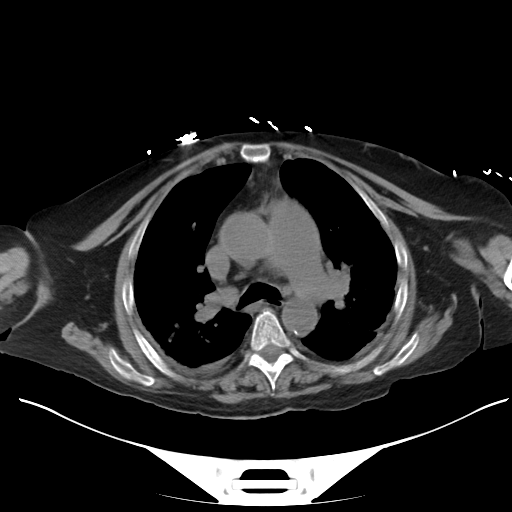
[im 106/126  lung]
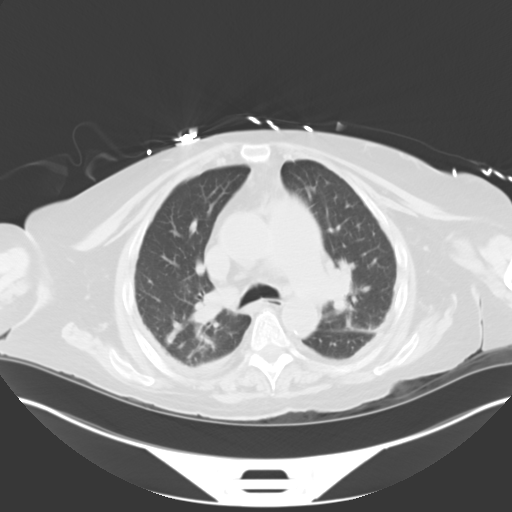
[im 116/126  lung]
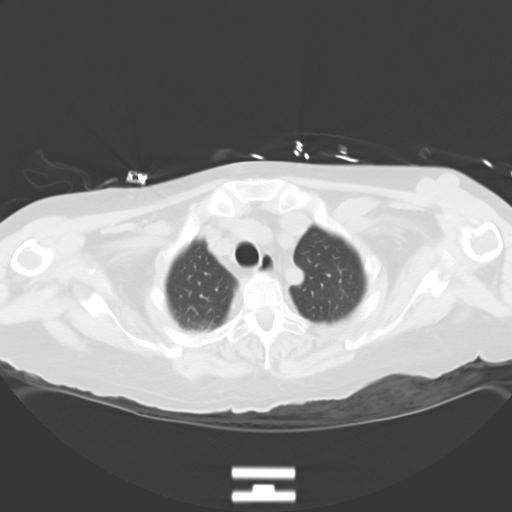

[Series 6: cor · coronal · 0.94mm/px · 3 of 105 slices shown]
[im 21/105  lung]
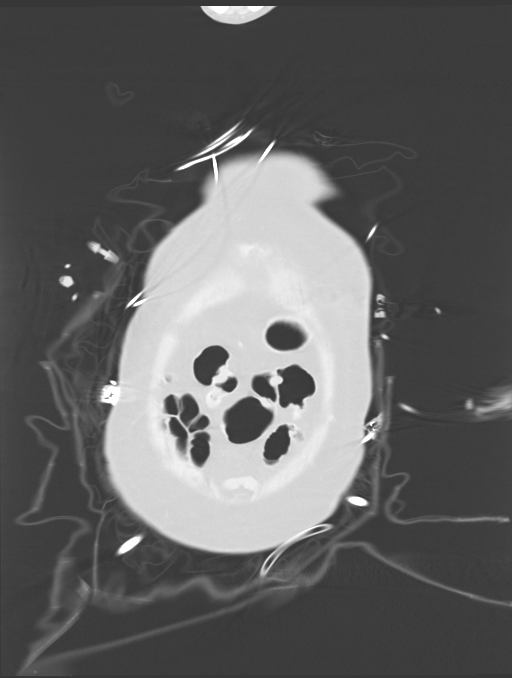
[im 42/105  lung]
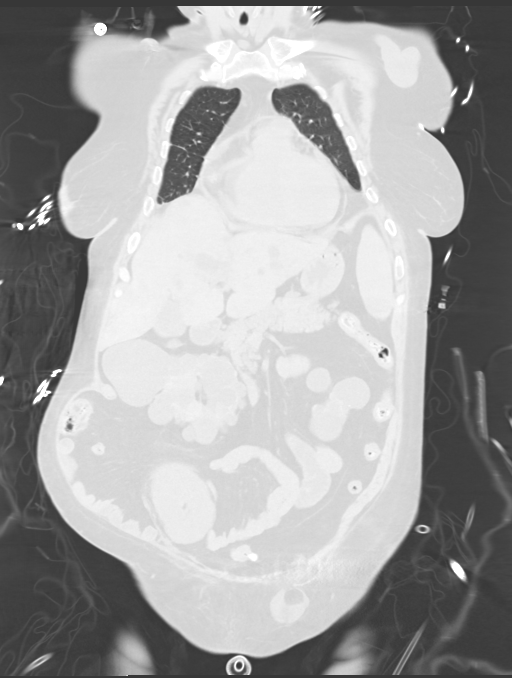
[im 63/105  lung]
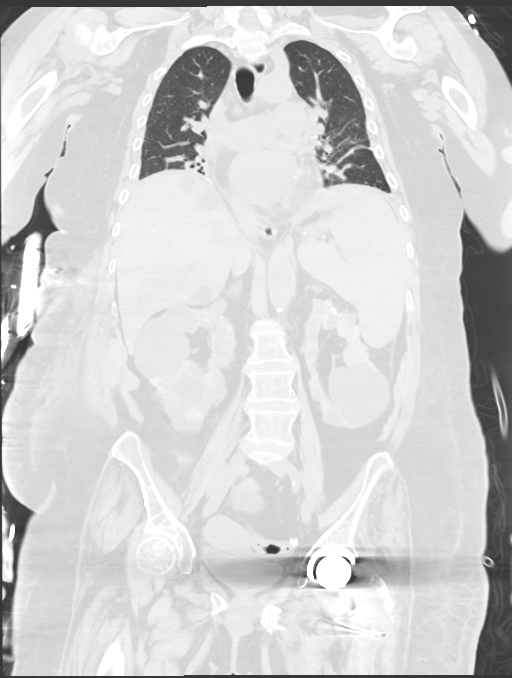

[13 of 36 positions shown; findings below may reference images not displayed]

FINDINGS: CT CHEST FINDINGS

Cardiovascular: No periaortic stranding to suggest injury. Dilated
main pulmonary artery at 4.1 cm. Small circumferential pericardial
effusion. Multi chamber cardiomegaly. There are prominent venous
collaterals anterior to the left axilla and in the included left
upper extremity. Decreased density of the blood pool suggesting
anemia.

Mediastinum/Nodes: No mediastinal hematoma. No pneumomediastinum.
Few scattered mediastinal lymph nodes that are not enlarged by size
criteria. Limited assessment for hilar adenopathy in the absence of
IV contrast. No esophageal wall thickening. Subcentimeter nodule in
the left lobe of the thyroid gland, does not need further evaluation
based on size.

Lungs/Pleura: Small bilateral pleural effusions. Compressive
atelectasis most prominent in the lower lobes. Additional
subsegmental atelectasis in the lingula and left upper lobe. No
pneumothorax. No evidence of pulmonary contusion. No pulmonary mass.
There is mild central bronchial thickening.

Musculoskeletal: Remote left anterior first and second rib
fractures. Remote anterior eighth, ninth, and tenth rib fractures
with callus. Remote posterior tenth rib fracture. No evidence of
acute rib fracture. Remote fractures of right anterior/lateral
second through ninth ribs. No acute right rib fracture. No sternal
fracture. No fracture of the thoracic spine, included clavicles or
shoulder girdles.

CT ABDOMEN PELVIS FINDINGS

Hepatobiliary: Multiple hepatic cysts. No evidence of [place]
injury or perihepatic hematoma. Layering hyperdensity in the
gallbladder is likely sludge. No pericholecystic inflammation. No
biliary dilatation.

Pancreas: Pancreas is unremarkable. No peripancreatic inflammation.
No evidence of pancreatic injury or pancreatic cyst.

Spleen: Splenomegaly with spleen spanning 15.4 cm cranial caudal. No
perisplenic hematoma or focal abnormality.

Adrenals/Urinary Tract: No adrenal nodule. Polycystic kidneys which
are enlarged with innumerable cysts of varying sizes. Transplant
kidney in the right iliac fossa. There is no transplant
hydronephrosis. No evidence of renal injury. Urinary bladder is only
minimally distended, partially obscured by streak artifact from left
hip arthroplasty. No obvious focal bladder abnormality.

Stomach/Bowel: Sigmoid colonic diverticulosis without
diverticulitis. Mild colonic tortuosity. Small volume of colonic
stool. Appendix is normal. Stomach and small bowel are unremarkable.
No obstruction, inflammatory change, or evidence of injury.

Vascular/Lymphatic: Aorto bi-iliac atherosclerosis. No aortic
aneurysm. No retroperitoneal fluid. No adenopathy.

Reproductive: Uterus partially obscured by streak artifact from left
hip arthroplasty. No suspicious adnexal mass.

Other: Small amount of fluid in the left inguinal canal is unchanged
from 12/20/2019 CT. No ascites or free fluid in the abdomen. No free
air. There is a small fat containing umbilical hernia.

Musculoskeletal: Left hip arthroplasty. The bones are under
mineralized. Remote left superior and inferior pubic rami fractures.
Remote bilateral sacral insufficiency fractures. No evidence of
acute fracture.
IMPRESSION: 1. No evidence of acute traumatic injury in the chest, abdomen, or
pelvis.
2. Small bilateral pleural effusions with compressive atelectasis.
3. Multi chamber cardiomegaly. Small circumferential pericardial
effusion. Dilated main pulmonary artery suggesting pulmonary
arterial hypertension.
4. No acute findings in the abdomen/pelvis.
5. Polycystic kidneys and liver. Transplant kidney in the right
iliac fossa without transplant hydronephrosis.
6. Splenomegaly.
7. Colonic diverticulosis without diverticulitis.
8. Remote bilateral rib fractures.  No acute rib fracture.

Aortic Atherosclerosis ([id number]).

## 2021-10-20 IMAGING — CT CT CERVICAL SPINE W/O CM
3 of 4 series · 12 of 33 positions shown, 14 images · non-contrast
Comparison: None.

CLINICAL DATA: Status post trauma.

EXAM:
CT CERVICAL SPINE WITHOUT CONTRAST
TECHNIQUE: Multidetector CT imaging of the cervical spine was performed without
intravenous contrast. Multiplanar CT image reconstructions were also
generated.

[Series 8: sag bone · sagittal · 0.24mm/px · 5 of 56 slices shown, 6 images]
[im 19/56  bone]
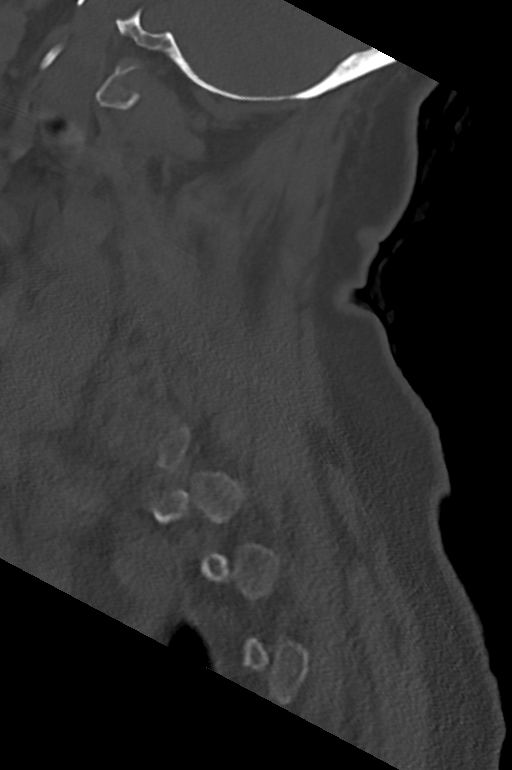
[im 23/56  bone]
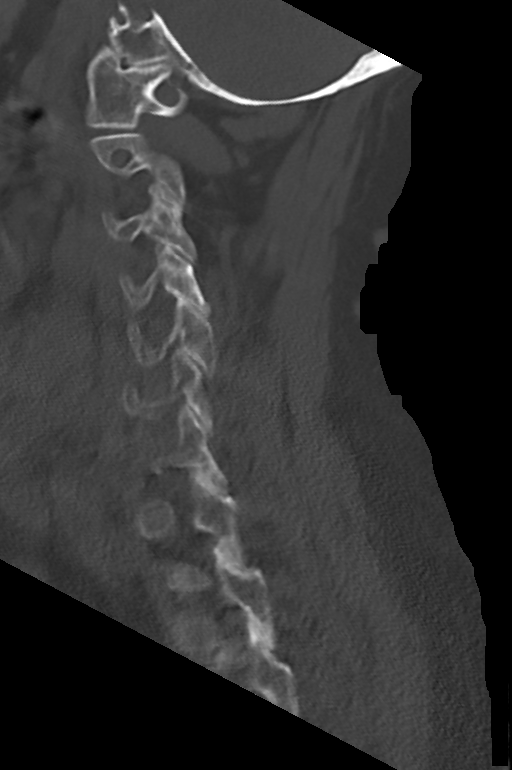
[im 28/56  soft-tissue]
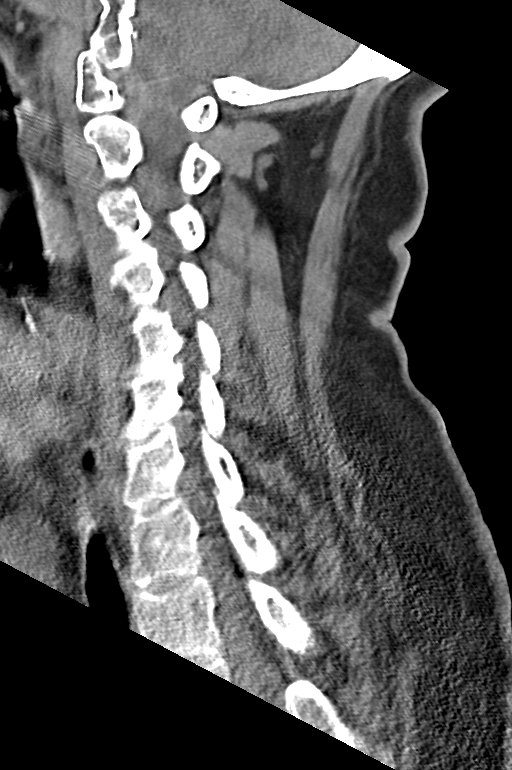
[im 28/56  bone]
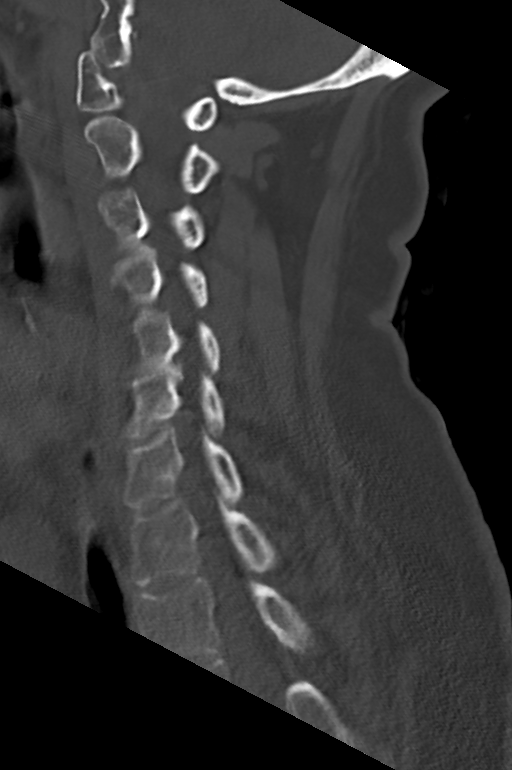
[im 33/56  bone]
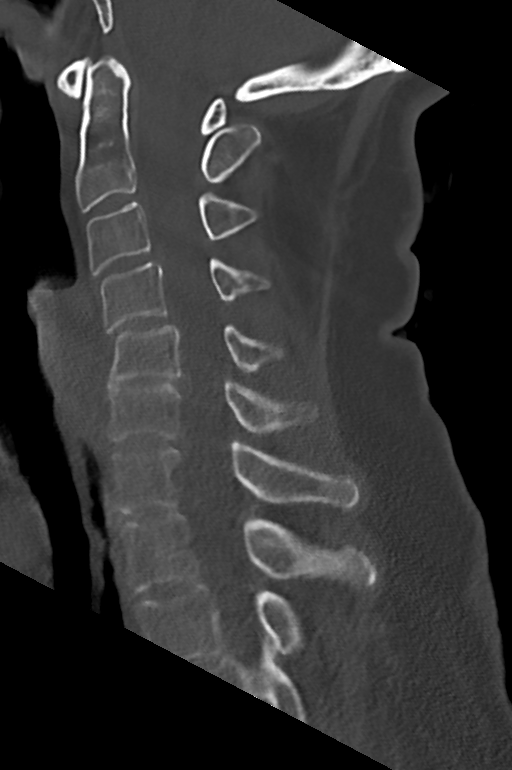
[im 37/56  bone]
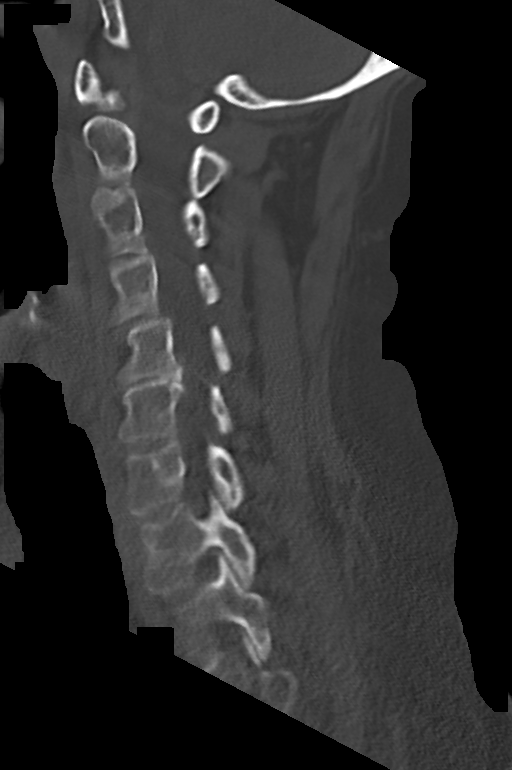

[Series 9: cor bone · coronal · 0.28mm/px · 3 of 61 slices shown]
[im 17/61  bone]
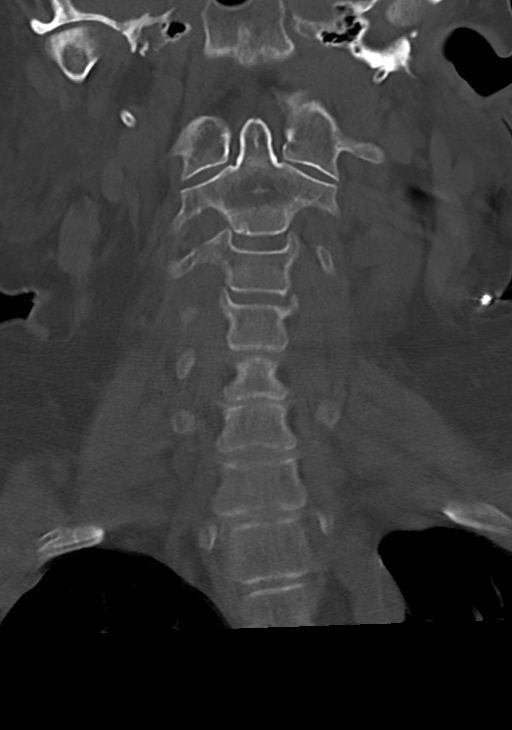
[im 26/61  bone]
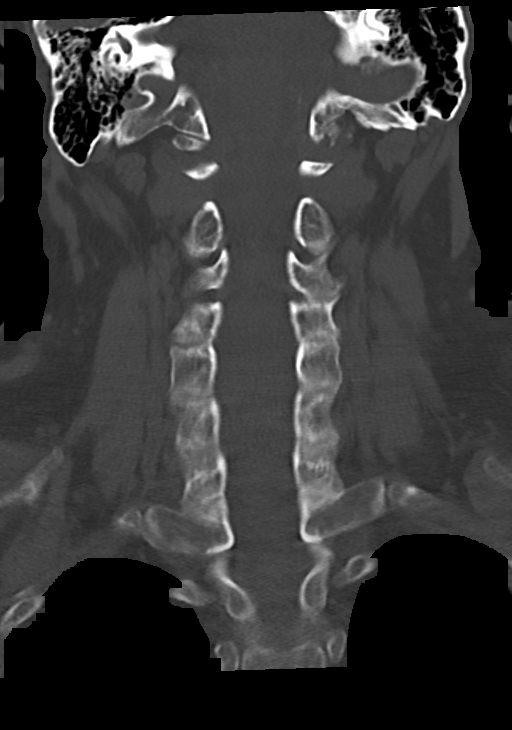
[im 35/61  bone]
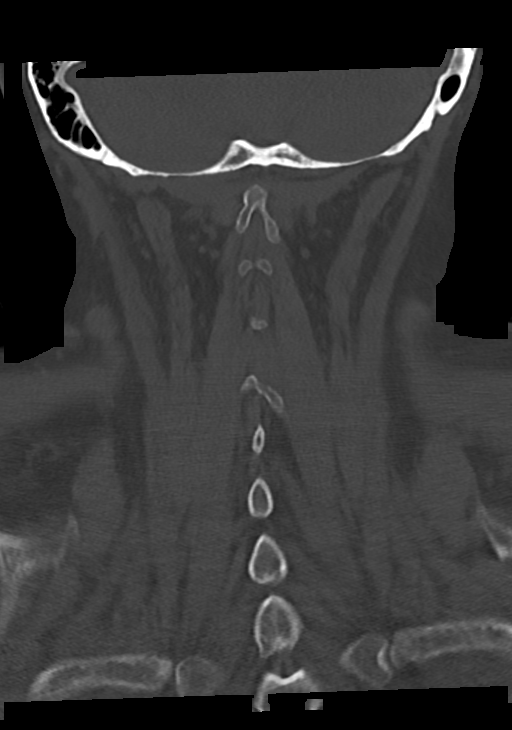

[Series 10: orthogonal axials · axial · 0.21mm/px · z∈[-187,-93]mm · 4 of 83 slices shown, 5 images]
[im 14/83  soft-tissue]
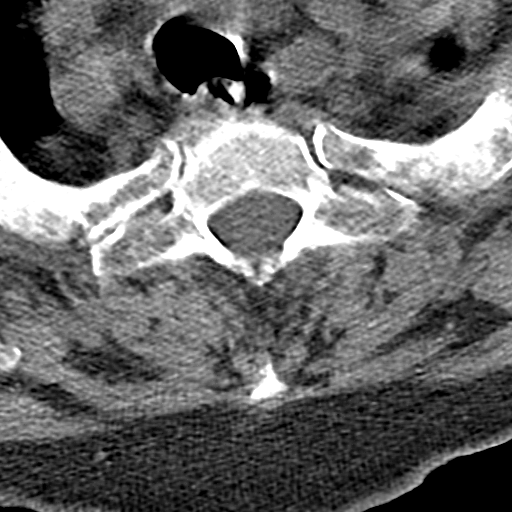
[im 14/83  bone]
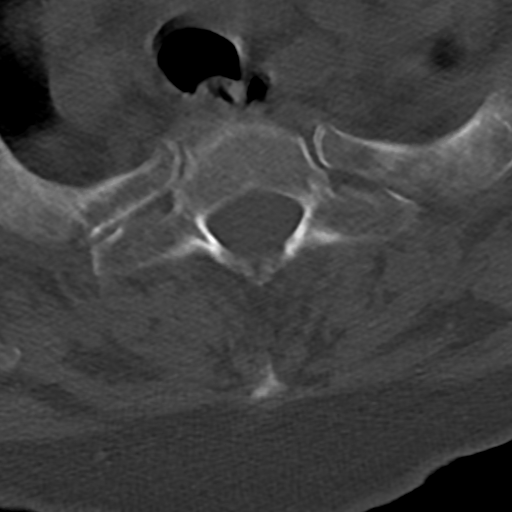
[im 28/83  bone]
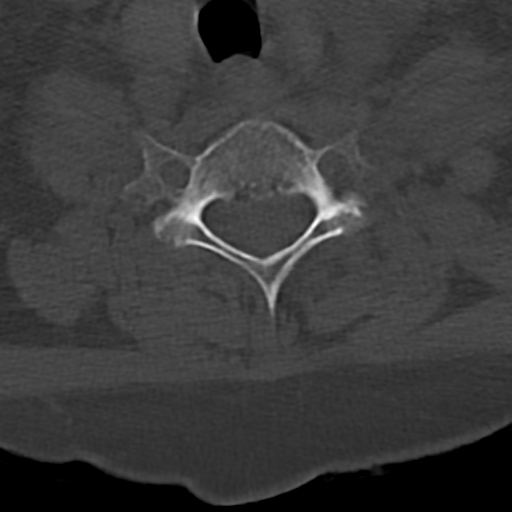
[im 55/83  bone]
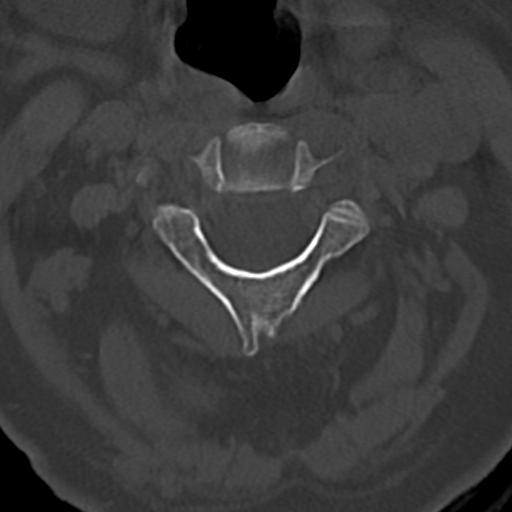
[im 69/83  bone]
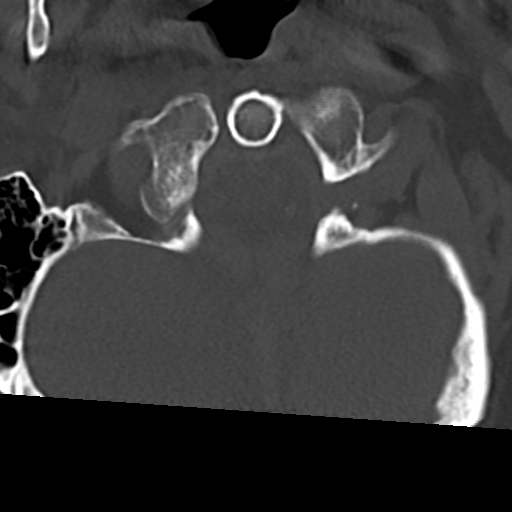

[12 of 33 positions shown; findings below may reference images not displayed]

FINDINGS: Alignment: Approximately 1 mm to 2 mm anterolisthesis of the C4
vertebral body is seen on C5.

Skull base and vertebrae: No acute fracture. No primary bone lesion
or focal pathologic process.

Soft tissues and spinal canal: No prevertebral fluid or swelling. No
visible canal hematoma.

Disc levels: Mild endplate sclerosis is seen at the level of C5-C6.
Moderate severity intervertebral disc space narrowing is also seen
at this level.

Upper chest: Negative.

Other: None.
IMPRESSION: 1. No acute osseous abnormality.
2. Approximately 1 mm to 2 mm anterolisthesis of the C4 vertebral
body on C5.
3. Moderate severity degenerative changes at the level of C5-C6.

## 2021-10-21 IMAGING — DX DG CHEST 1V
1 series · 1 of 1 positions shown · non-contrast
Comparison: CT chest 04/15/2020

CLINICAL DATA: Shortness of breath

EXAM:
CHEST  1 VIEW

[chest ap]
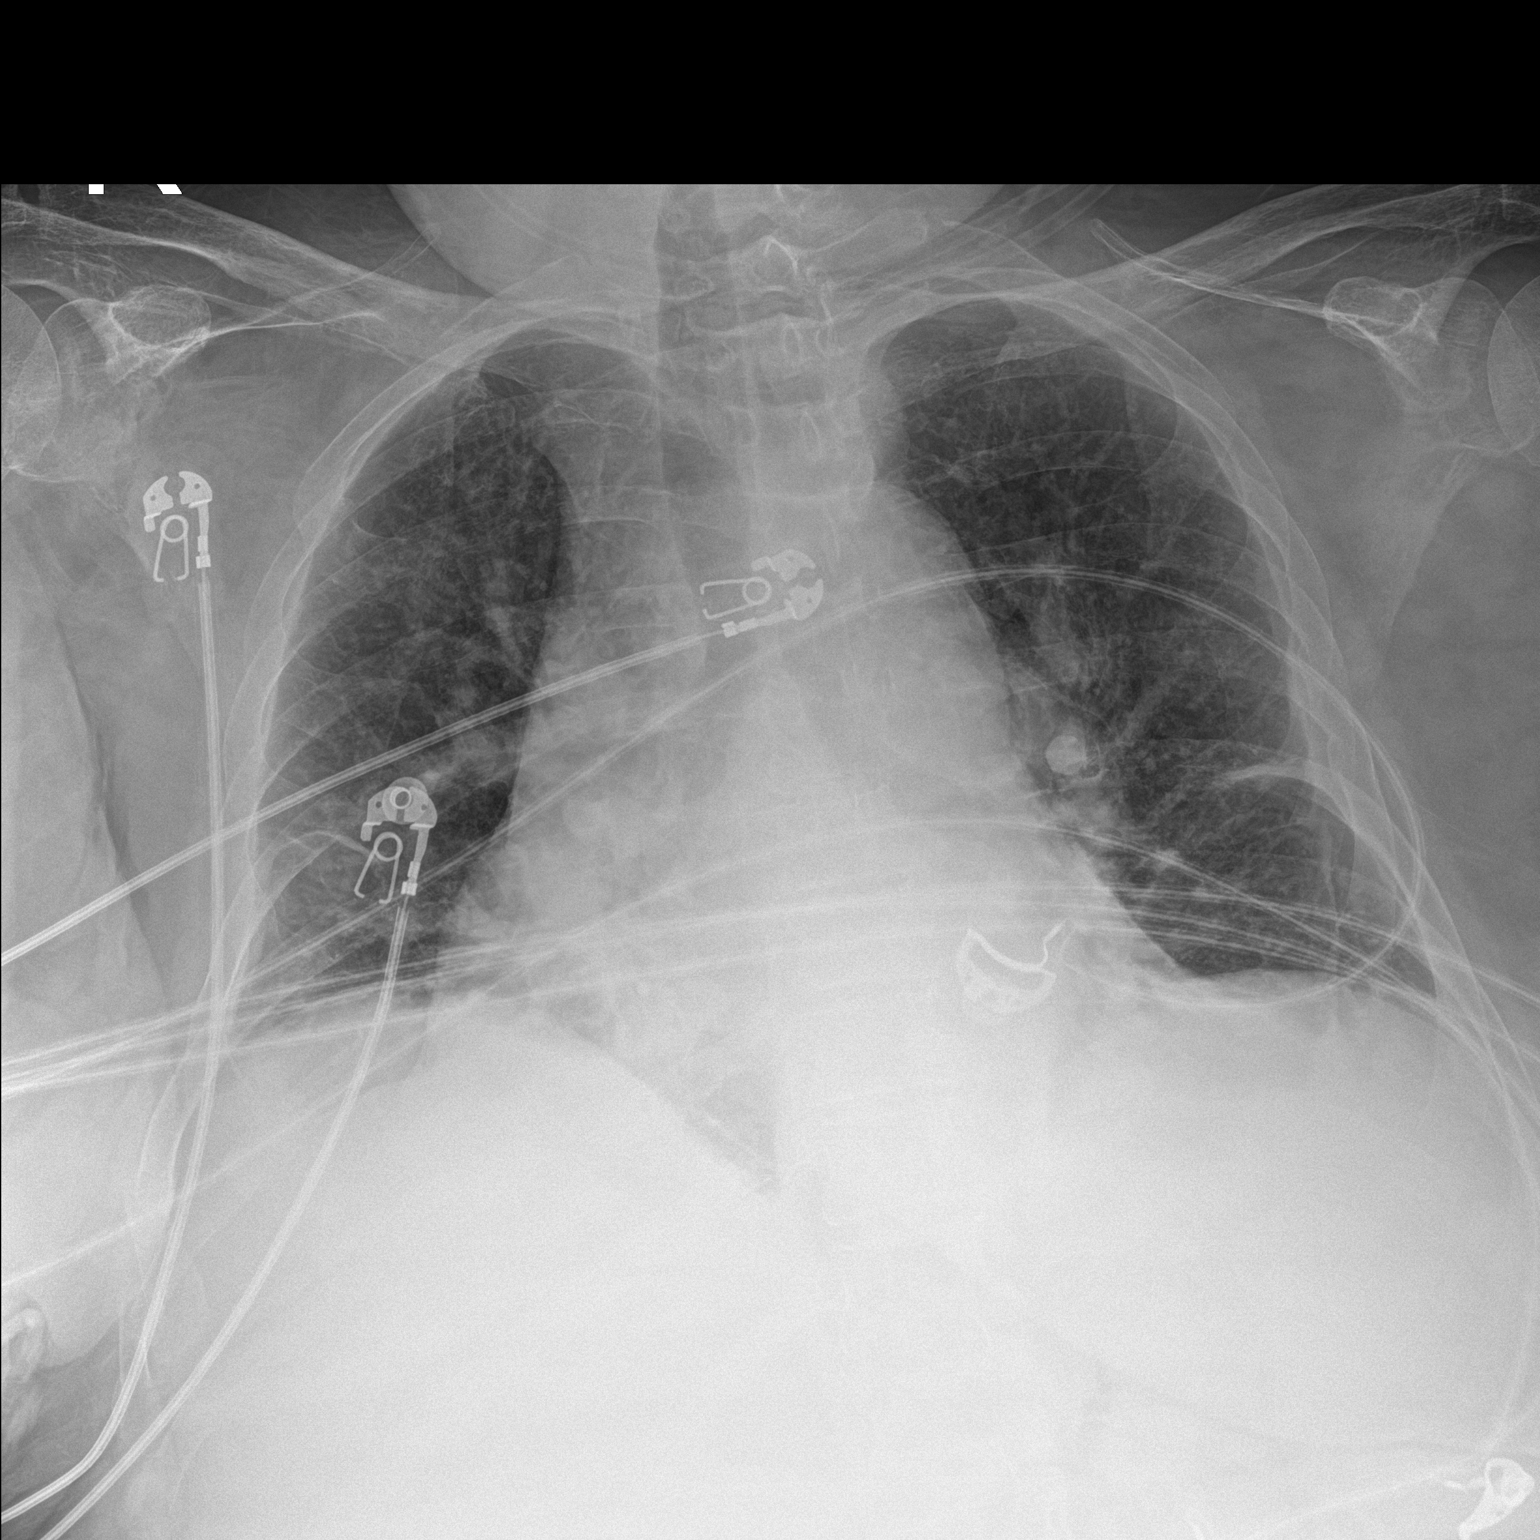

[1 of 1 positions shown; findings below may reference images not displayed]

FINDINGS: Bilateral effusions are less well visualized on today's examination.
There is interstitial opacity throughout the lungs with a perihilar
and basilar predominance with some fissural and septal thickening as
well. The heart is enlarged. Pulmonary vascularity is cephalized
indistinct with prominence of the central pulmonary arteries is
again noted, better seen on comparison. No acute osseous or soft
tissue abnormality. Telemetry leads overlie the chest.
IMPRESSION: Findings highly suspicious for congestive heart failure with
cardiomegaly, vascular congestion and bilateral effusions with
interstitial opacities suggesting some developing interstitial
edema.

## 2021-11-03 IMAGING — DX DG CHEST 1V PORT
1 series · 1 of 1 positions shown · non-contrast
Comparison: April 16, 2020

CLINICAL DATA: Shortness of breath

EXAM:
PORTABLE CHEST 1 VIEW

[chest]
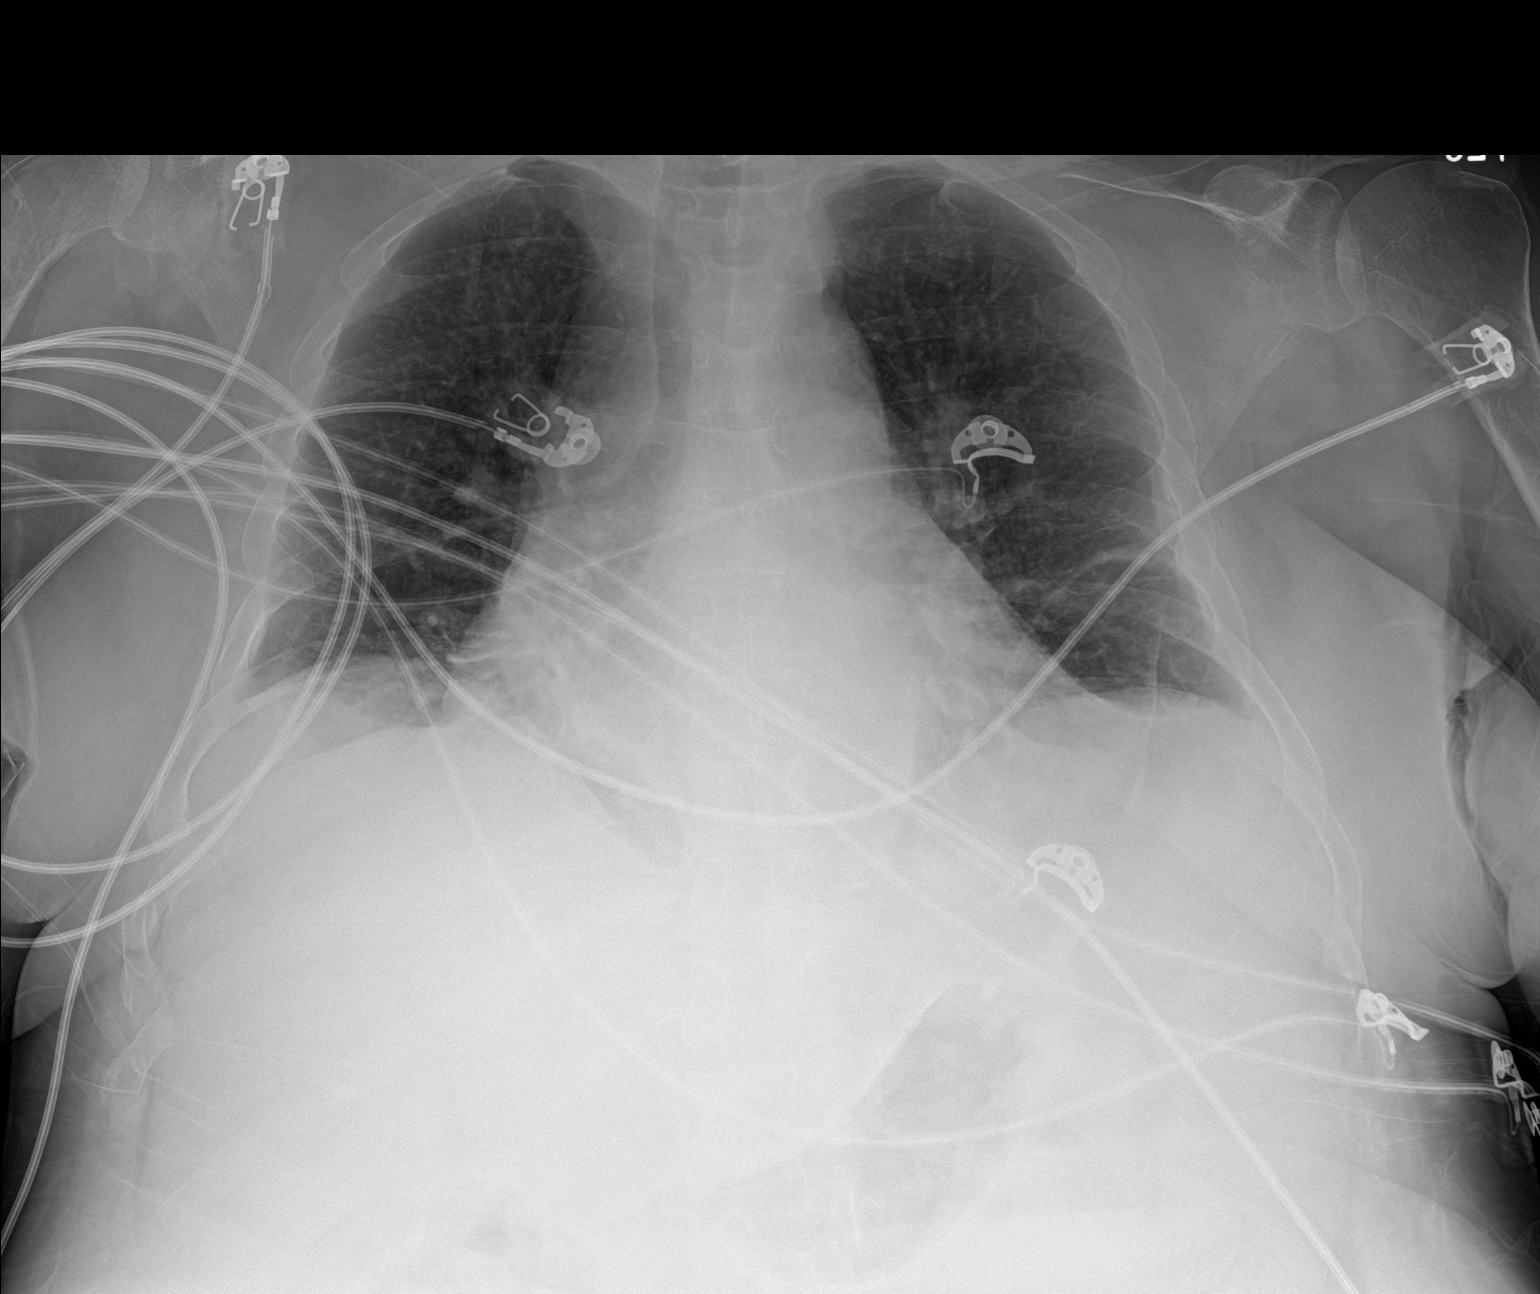

[1 of 1 positions shown; findings below may reference images not displayed]

FINDINGS: There is atelectatic change in the lower lung zones bilaterally,
stable. No edema or airspace opacity. Heart is mildly enlarged. The
pulmonary vascularity is within normal limits. No adenopathy. No
bone lesions.
IMPRESSION: Mild bibasilar atelectasis. No edema or airspace opacity. Heart
mildly enlarged with pulmonary vascularity within normal limits. No
evident adenopathy.

## 2021-11-03 IMAGING — CT CT CHEST W/O CM
2 of 4 series · 15 of 36 positions shown, 18 images · non-contrast
Comparison: Current chest radiograph.  CT chest dated 04/15/2020.

CLINICAL DATA: Pneumonia. Pleural effusion or abscess suspected.
Follow-up to current chest radiograph.

EXAM:
CT CHEST WITHOUT CONTRAST
TECHNIQUE: Multidetector CT imaging of the chest was performed following the
standard protocol without IV contrast.

[Series 3: chest w/o 2mm st · axial · non-contrast · 0.92mm/px · z∈[+1152,+1446]mm · 12 of 171 slices shown, 15 images]
[im 12/171  mediastinal]
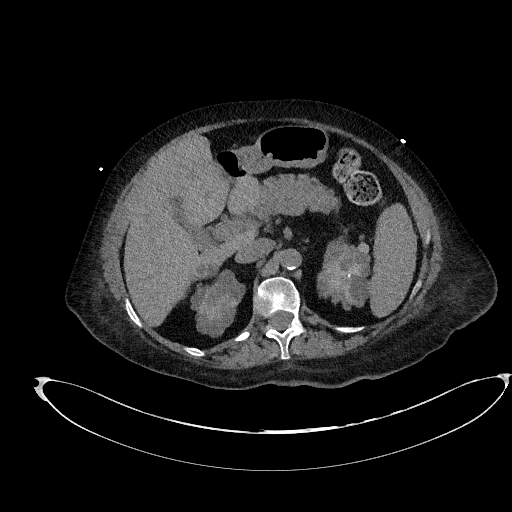
[im 12/171  lung]
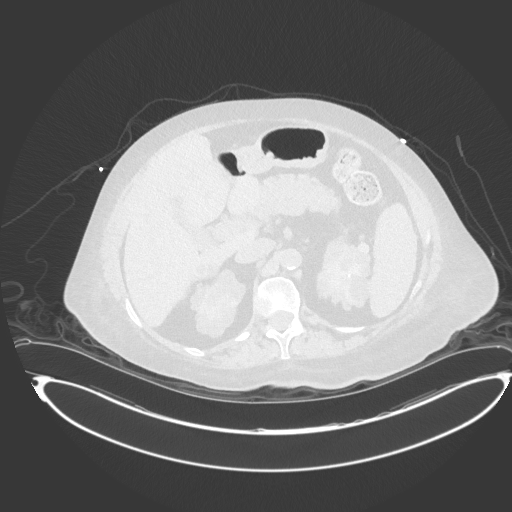
[im 23/171  lung]
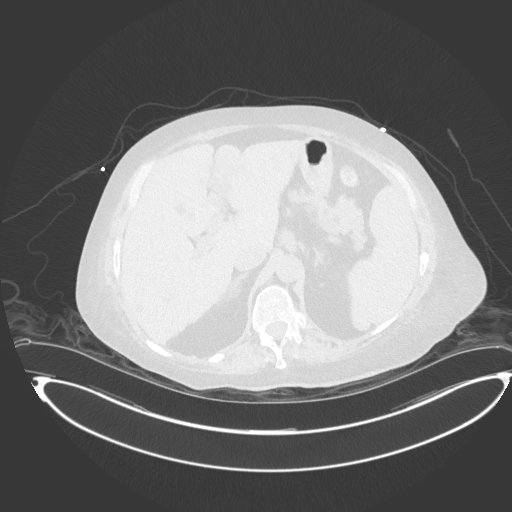
[im 35/171  lung]
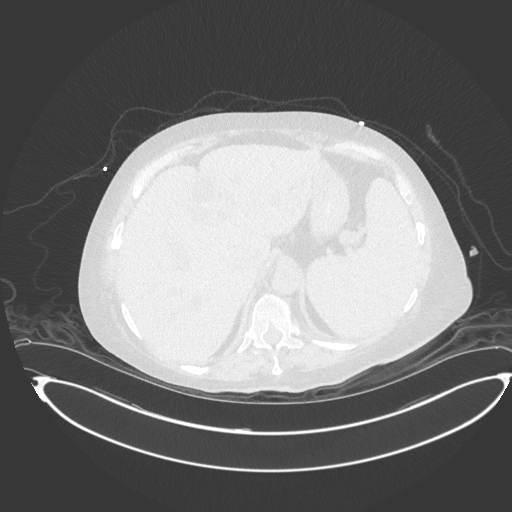
[im 57/171  lung]
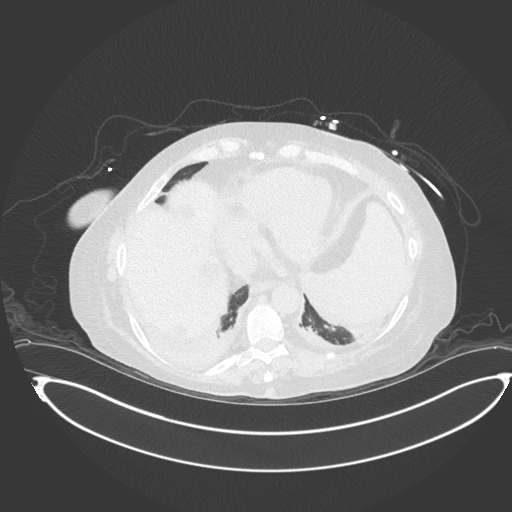
[im 69/171  mediastinal]
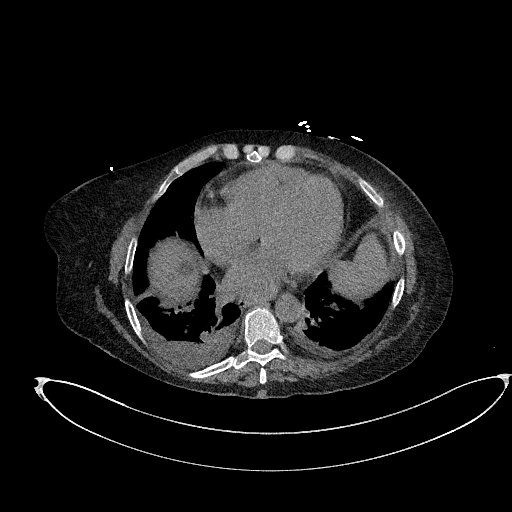
[im 69/171  lung]
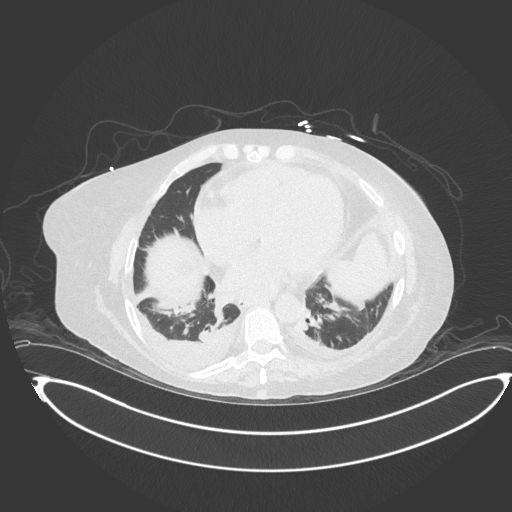
[im 80/171  lung]
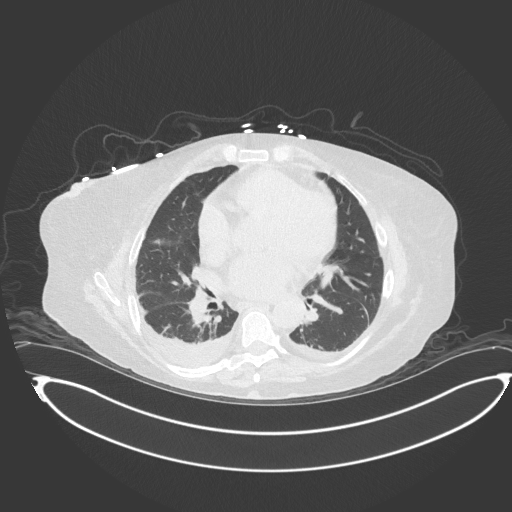
[im 91/171  lung]
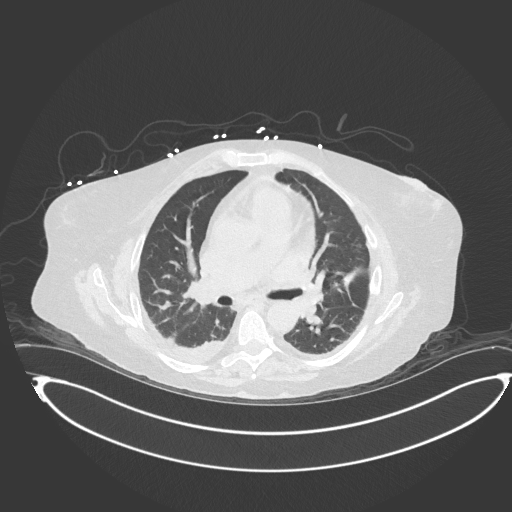
[im 103/171  lung]
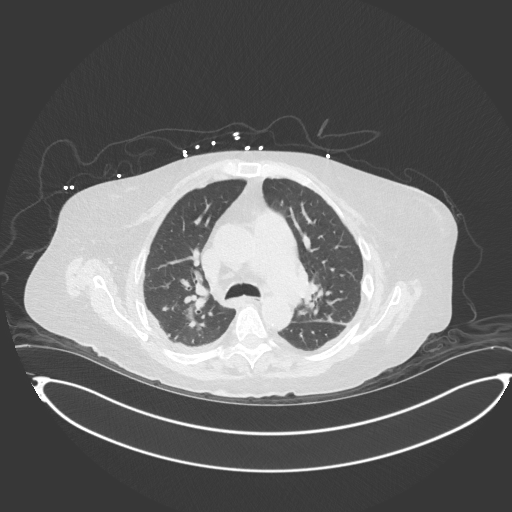
[im 114/171  mediastinal]
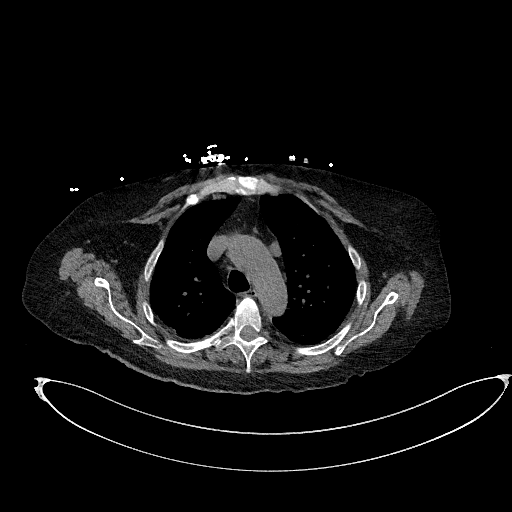
[im 114/171  lung]
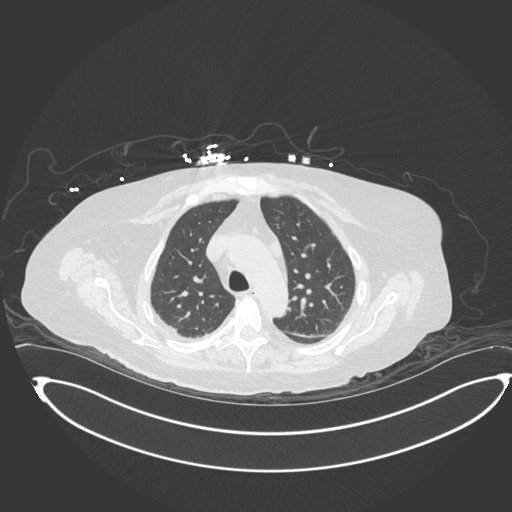
[im 137/171  lung]
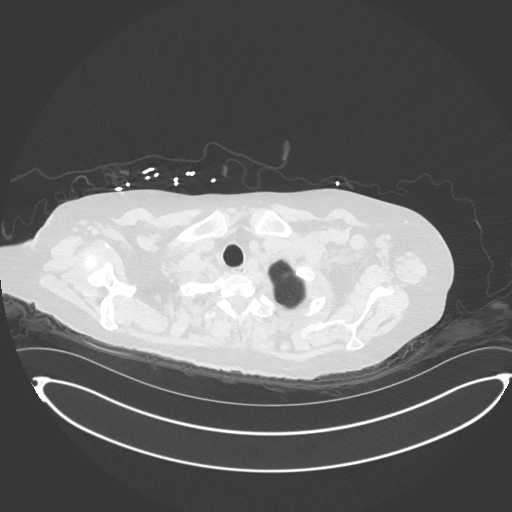
[im 148/171  lung]
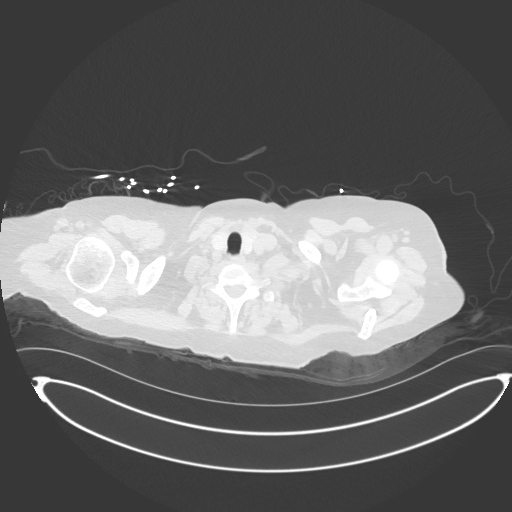
[im 159/171  lung]
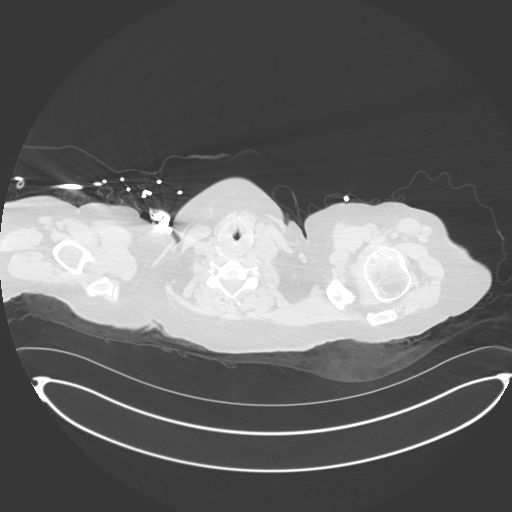

[Series 6: chest w/o 2mm st cor · coronal · non-contrast · 0.76mm/px · 3 of 153 slices shown]
[im 31/153  lung]
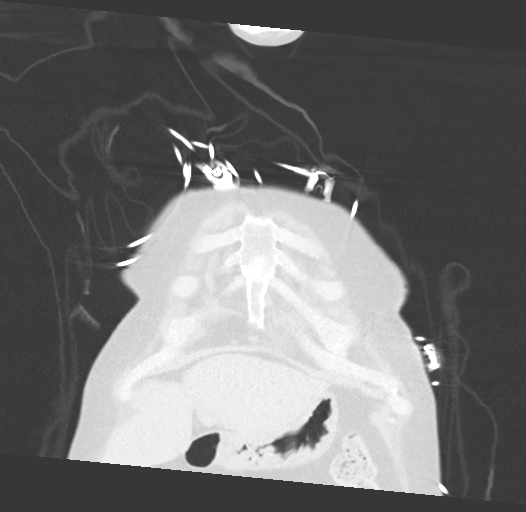
[im 61/153  lung]
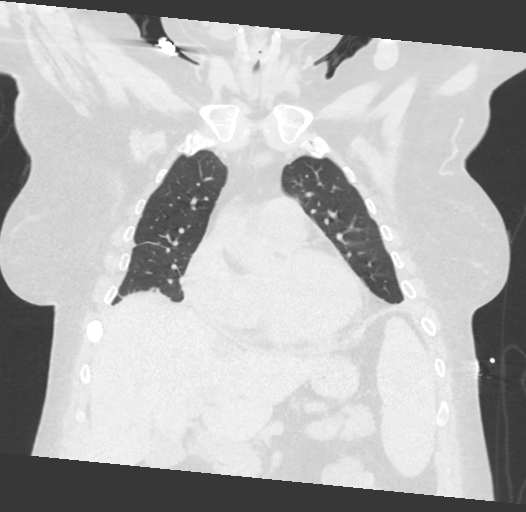
[im 92/153  lung]
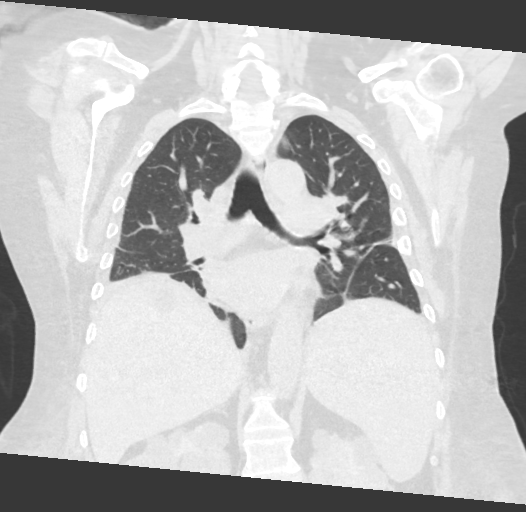

[15 of 36 positions shown; findings below may reference images not displayed]

FINDINGS: Cardiovascular: Heart is mildly enlarged. No pericardial effusion.
Minimal left coronary artery calcifications. Aorta is normal in
caliber. Minor atherosclerotic calcifications along the descending
portion. Main pulmonary artery is dilated to 3.8 cm.

Mediastinum/Nodes: No neck base, mediastinal or hilar masses or
enlarged lymph nodes. Trachea and esophagus are unremarkable.

Lungs/Pleura: Small right and trace left pleural effusions.

Mild dependent atelectasis noted in both lower lobes and minimally
at the diaphragmatic bases of the left upper lobe lingula right
middle lobe and dependent upper lobes. Minor linear atelectasis in
the left upper lobe.

No convincing pneumonia and no evidence of pulmonary edema. No mass
or suspicious nodule. No pneumothorax.

Upper Abdomen: Multiple liver and bilateral renal cysts consistent
with polycystic kidney disease. Enlarged spleen, 14.5 cm greatest
transverse dimension. No acute findings in the visualized upper
abdomen and no change from the prior CT.

Musculoskeletal: Multiple old bilateral rib fractures. No acute
fractures. No osteoblastic or osteolytic lesions.
IMPRESSION: 1. Small right and trace left pleural effusions. There is associated
atelectasis, but no convincing pneumonia and no evidence of
pulmonary edema. No abscess. The lung appearance is similar to the
previous chest CT.
2. Mild cardiomegaly.
3. Enlarged main pulmonary artery suggesting pulmonary artery
hypertension.
4. Stable changes polycystic kidney disease including multiple liver
cysts. Stable splenomegaly.

Aortic Atherosclerosis ([id number]).

## 2021-11-17 IMAGING — DX DG CHEST 1V PORT
1 series · 1 of 1 positions shown · non-contrast
Comparison: 04/29/2020

CLINICAL DATA: Short of breath chest pain

EXAM:
PORTABLE CHEST 1 VIEW

[chest ap]
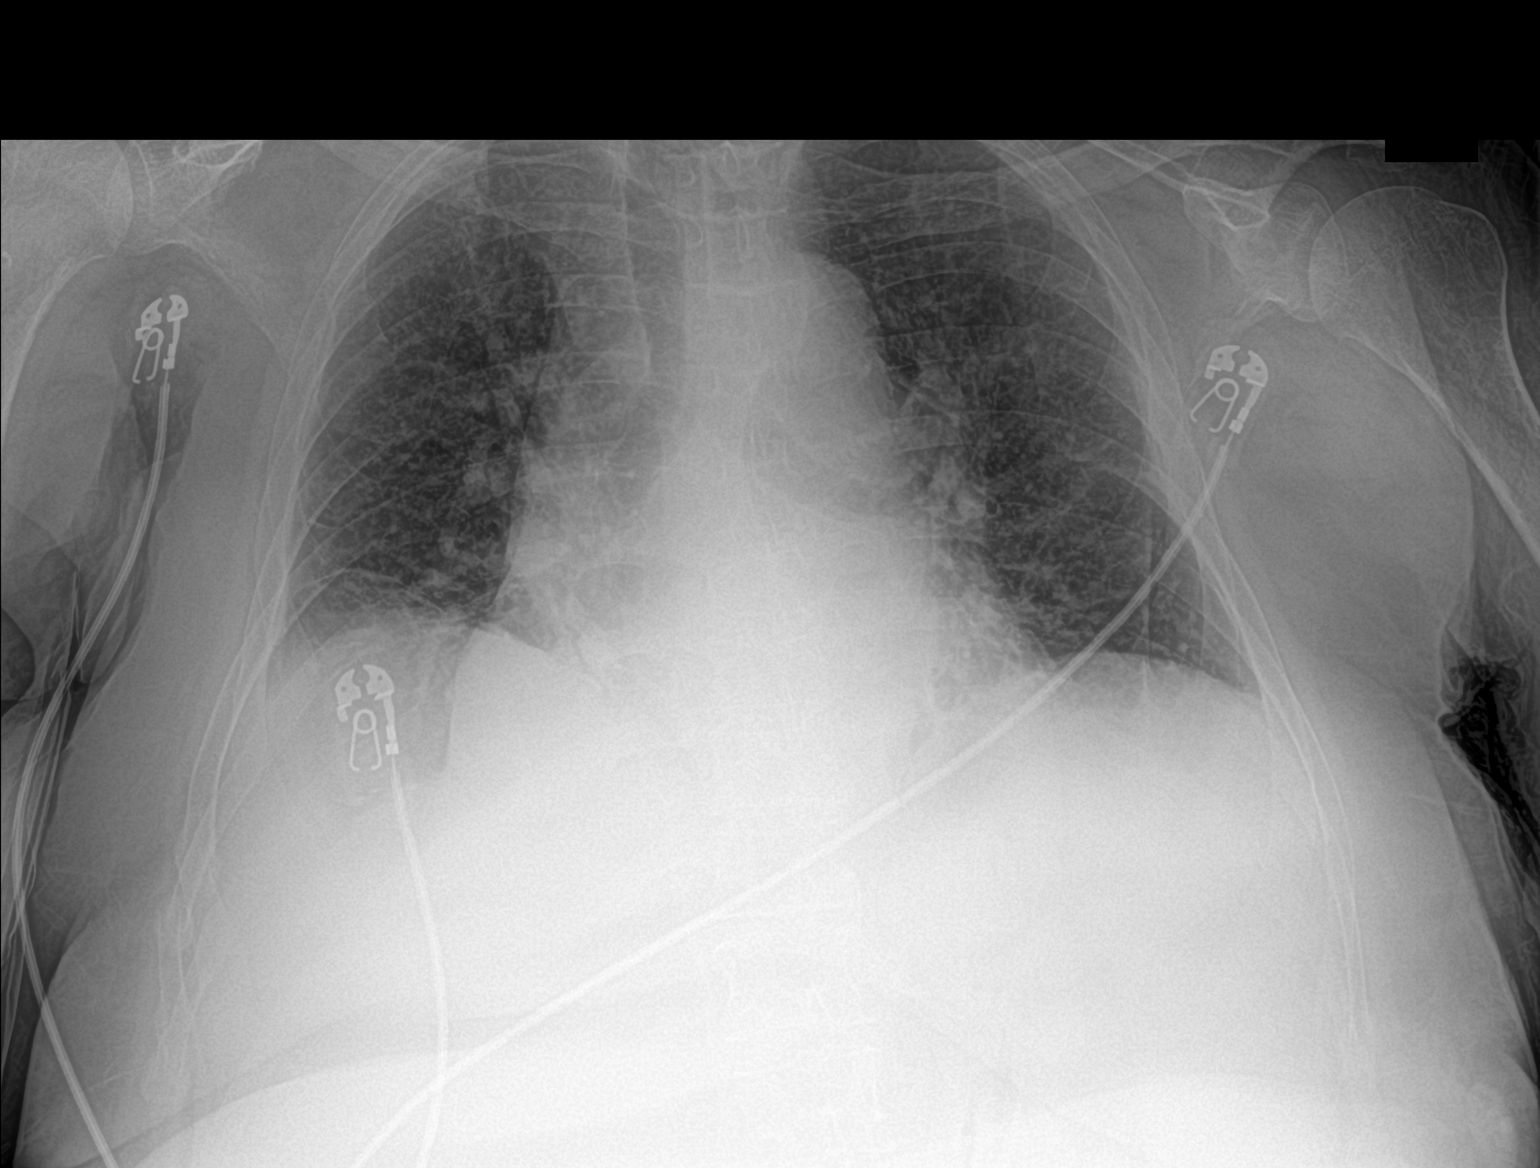

[1 of 1 positions shown; findings below may reference images not displayed]

FINDINGS: Hypoventilation with decreased lung volume. Mild bibasilar
atelectasis similar to the prior study

Negative for heart failure or pneumonia. Small right pleural
effusion.
IMPRESSION: Hypoventilation with bibasilar atelectasis unchanged from the prior
study

## 2021-11-18 IMAGING — CT CT CHEST W/O CM
2 of 3 series · 14 of 36 positions shown, 17 images · non-contrast
Comparison: Chest radiograph dated 04/29/2020.

CLINICAL DATA: 63-year-old female with shortness of breath and
chest pain.

EXAM:
CT CHEST WITHOUT CONTRAST
TECHNIQUE: Multidetector CT imaging of the chest was performed following the
standard protocol without IV contrast.

[Series 3: chest w/o 2mm st · axial · non-contrast · 0.85mm/px · z∈[+612,+818]mm · 11 of 123 slices shown, 14 images]
[im 10/123  mediastinal]
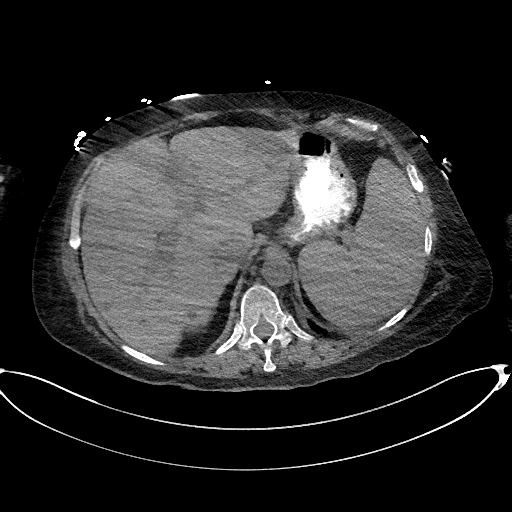
[im 10/123  lung]
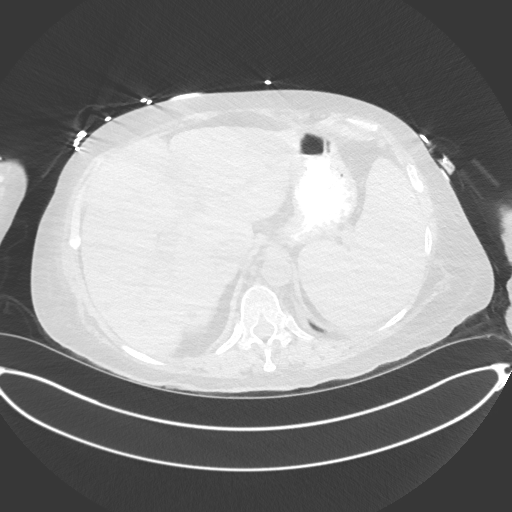
[im 19/123  lung]
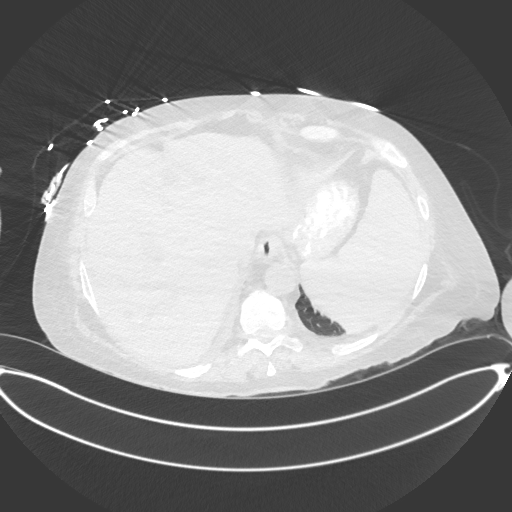
[im 28/123  lung]
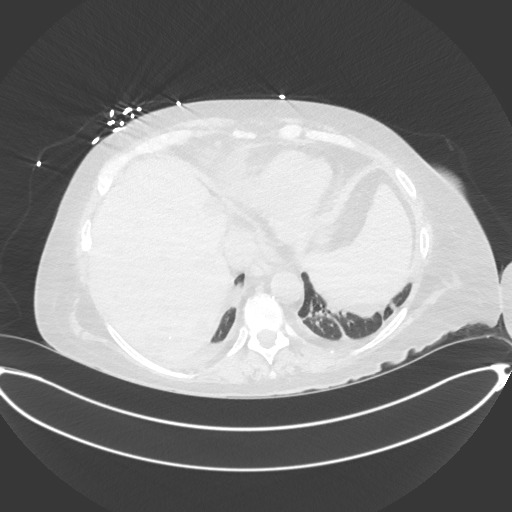
[im 41/123  lung]
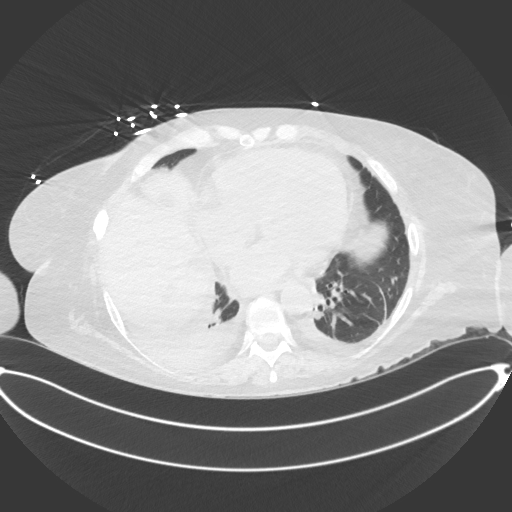
[im 50/123  mediastinal]
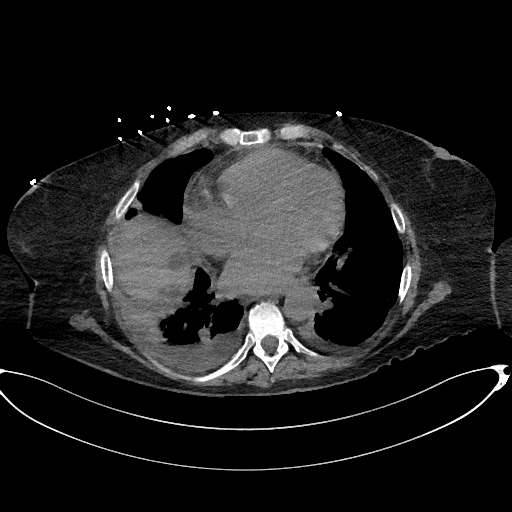
[im 50/123  lung]
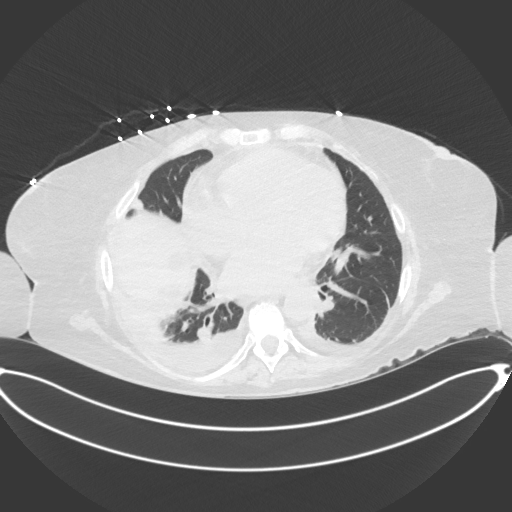
[im 64/123  lung]
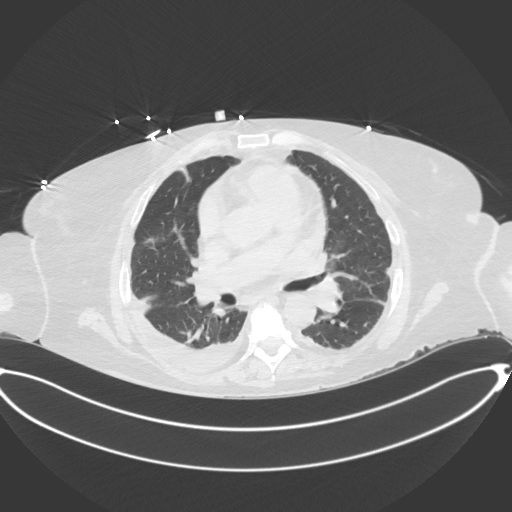
[im 73/123  lung]
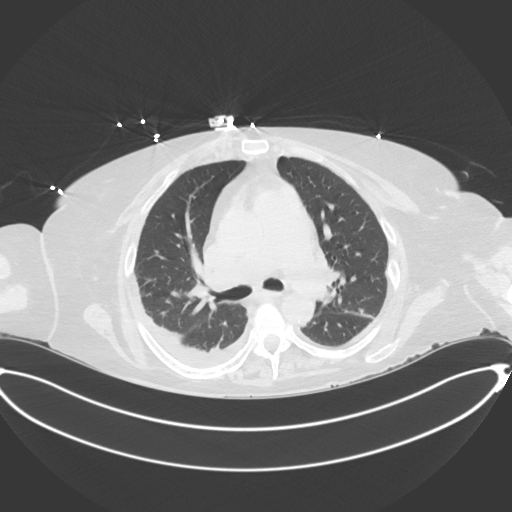
[im 82/123  lung]
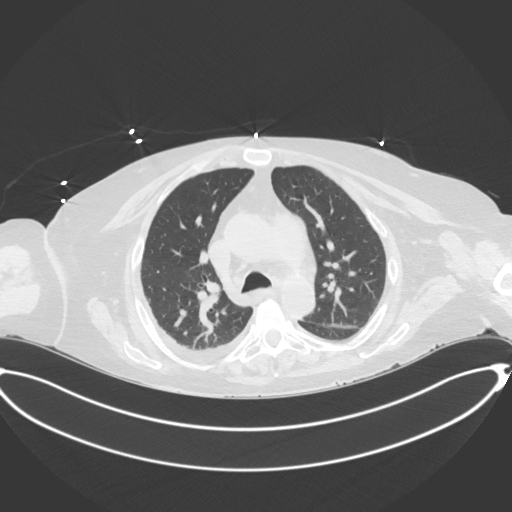
[im 95/123  mediastinal]
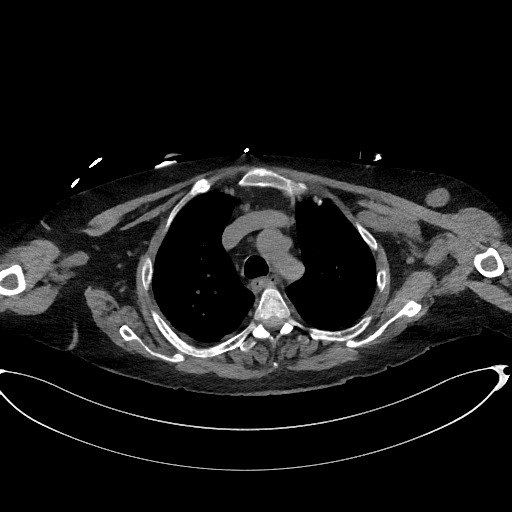
[im 95/123  lung]
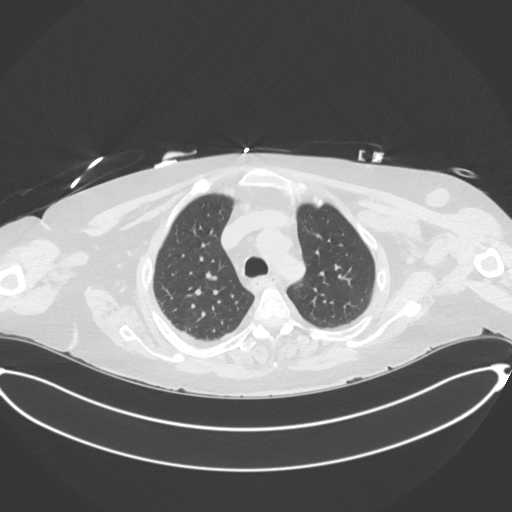
[im 104/123  lung]
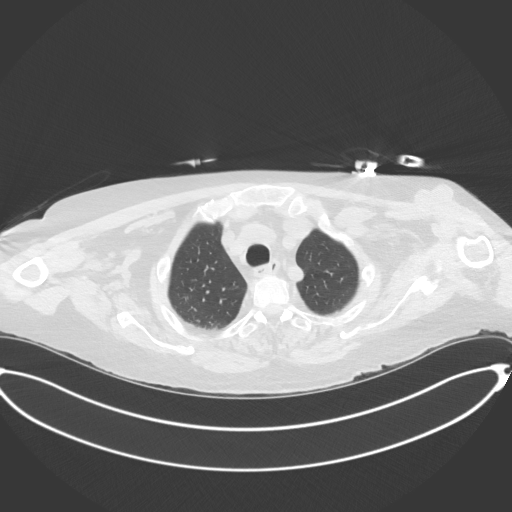
[im 113/123  lung]
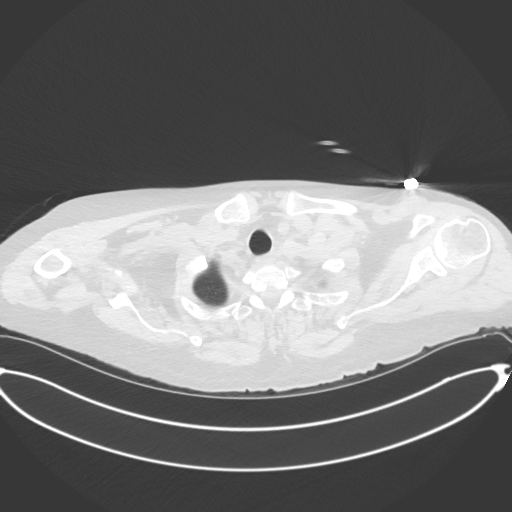

[Series 5: chest w/o 2mm st cor · coronal · non-contrast · 0.48mm/px · 3 of 150 slices shown]
[im 30/150  lung]
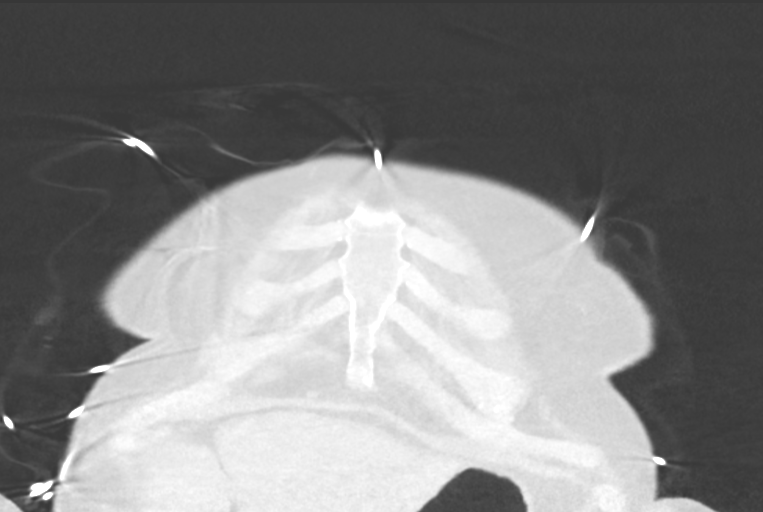
[im 60/150  lung]
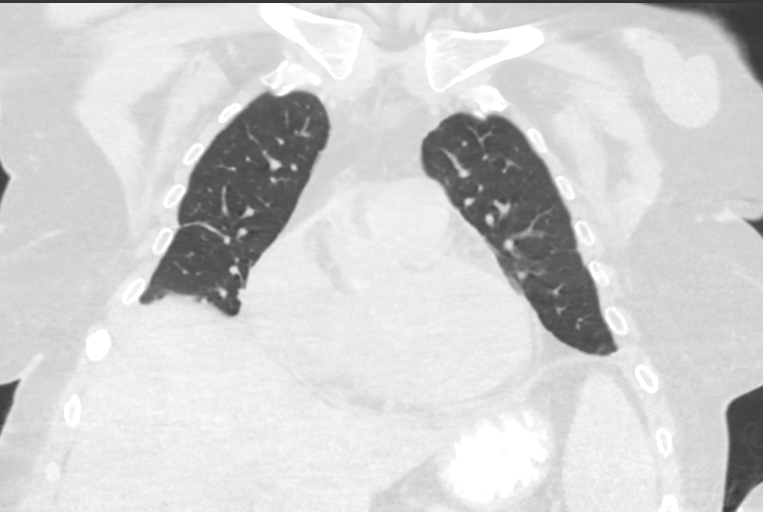
[im 90/150  lung]
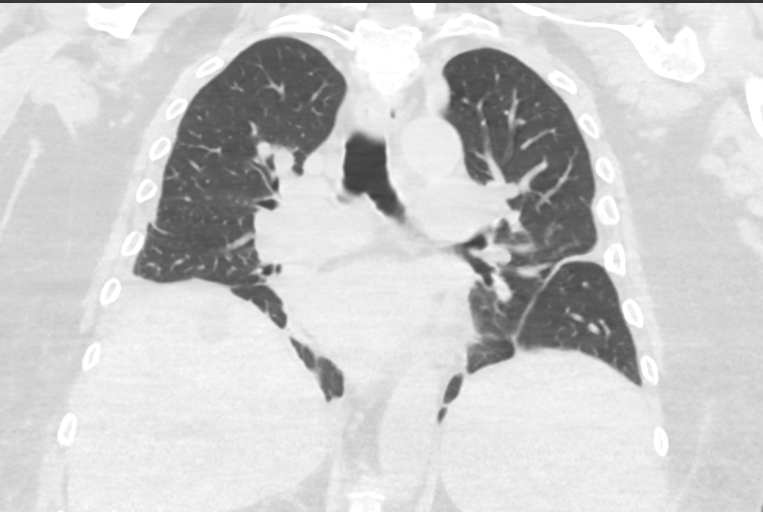

[14 of 36 positions shown; findings below may reference images not displayed]

FINDINGS: Evaluation of this exam is limited in the absence of intravenous
contrast.

Cardiovascular: There is mild cardiomegaly. No pericardial effusion.
Coronary vascular calcification. There is mild atherosclerotic
calcification of the thoracic aorta. There is mild dilatation of the
main pulmonary trunk suggestive of pulmonary hypertension. Clinical
correlation is recommended. Evaluation of the vasculature is limited
in the absence of intravenous contrast.

Mediastinum/Nodes: No hilar or mediastinal adenopathy. The esophagus
and the thyroid gland are grossly unremarkable. No mediastinal fluid
collection.

Lungs/Pleura: Small bilateral pleural effusions similar to prior CT
with associated partial compressive atelectasis of the lower lobes.
Pneumonia is not excluded. Clinical correlation is recommended.
There are bibasilar linear atelectasis/scarring. No pneumothorax.
The central airways are patent.

Upper Abdomen: Please see the report for the accompanying CT abdomen
pelvis.

Musculoskeletal: No chest wall mass or suspicious bone lesions
identified.
IMPRESSION: 1. Small bilateral pleural effusions with associated partial
compressive atelectasis of the lower lobes. Pneumonia is not
excluded. Clinical correlation is recommended.
2. Mild cardiomegaly with coronary vascular calcification.
3. Mildly dilated main pulmonary trunk suggestive of pulmonary
hypertension. Clinical correlation is recommended.
4. Aortic Atherosclerosis (E311C-V79.9).

## 2021-11-18 IMAGING — CT CT ABD-PELV W/ CM
2 of 5 series · 16 of 46 positions shown, 18 images · IV contrast (omnipaque)
Comparison: CT abdomen pelvis dated 04/15/2020.

CLINICAL DATA: 63-year-old female with abdominal pain.

EXAM:
CT ABDOMEN AND PELVIS WITH CONTRAST
TECHNIQUE: Multidetector CT imaging of the abdomen and pelvis was performed
using the standard protocol following bolus administration of
intravenous contrast.
CONTRAST:  100mL OMNIPAQUE IOHEXOL 300 MG/ML  SOLN

[Series 3: a/p w/ 5mm (person_name) · axial · 0.98mm/px · z∈[+271,+691]mm · 13 of 98 slices shown, 15 images]
[im 7/98  soft-tissue]
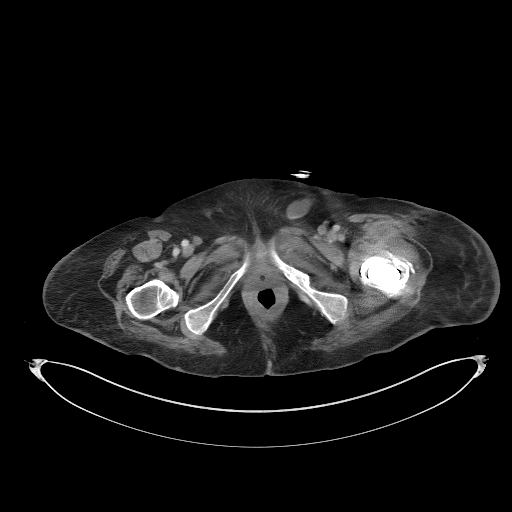
[im 7/98  bone]
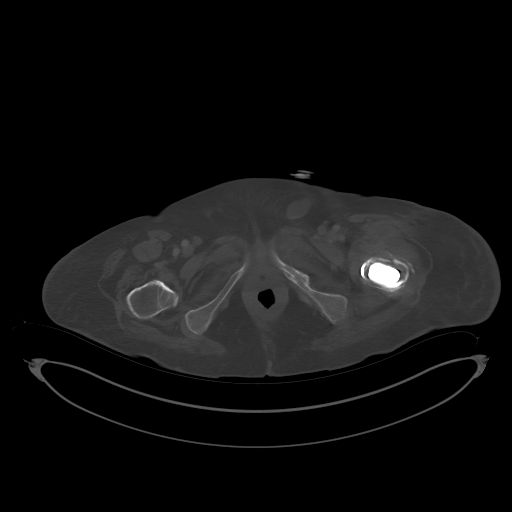
[im 13/98  soft-tissue]
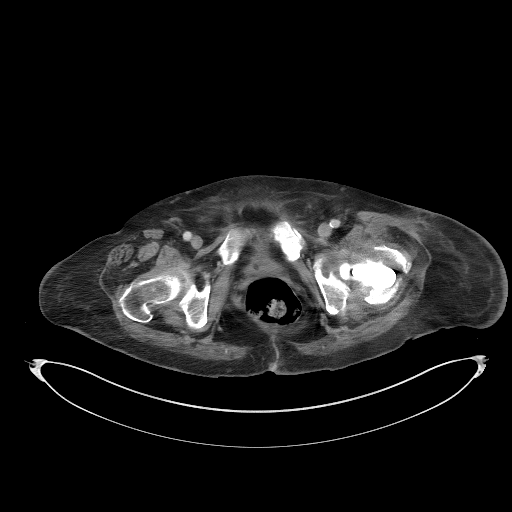
[im 19/98  soft-tissue]
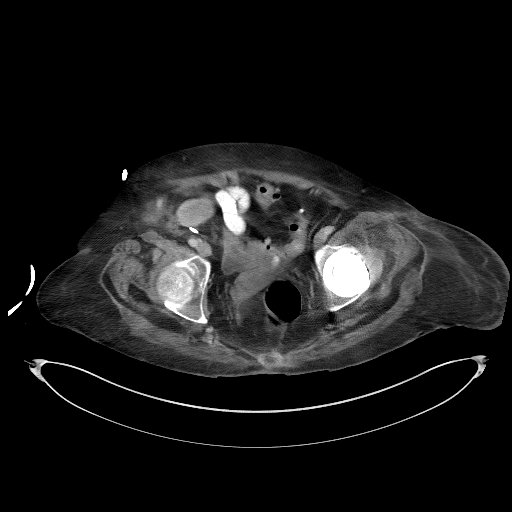
[im 31/98  soft-tissue]
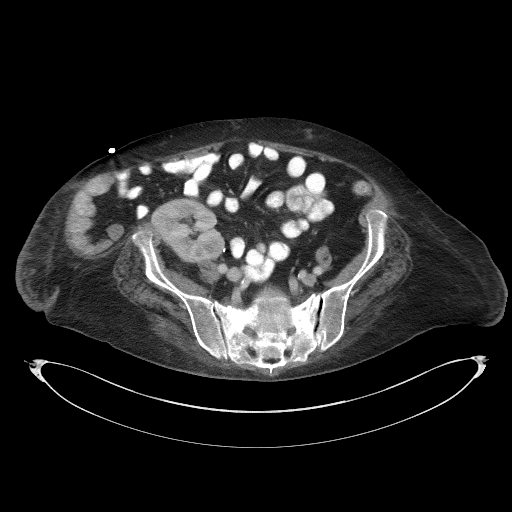
[im 37/98  soft-tissue]
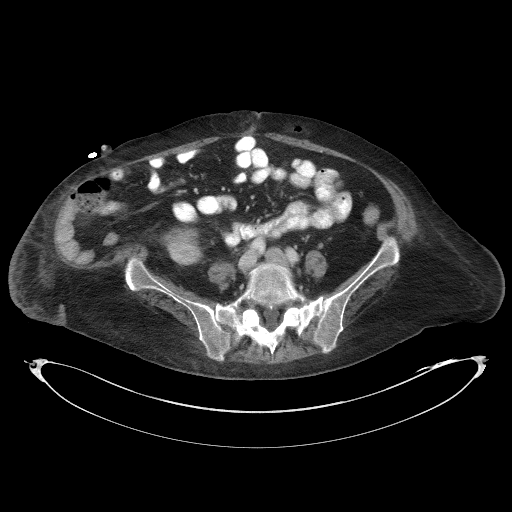
[im 43/98  soft-tissue]
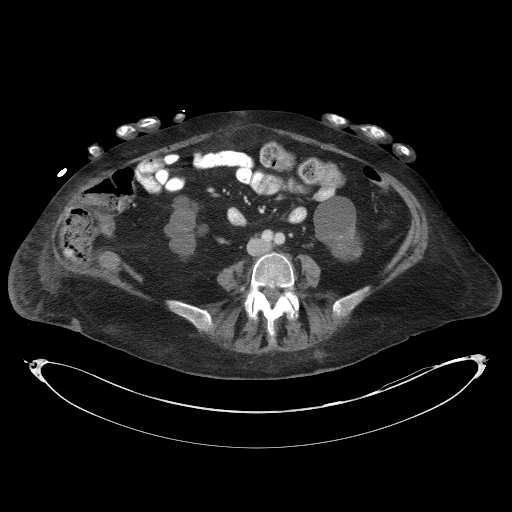
[im 49/98  soft-tissue]
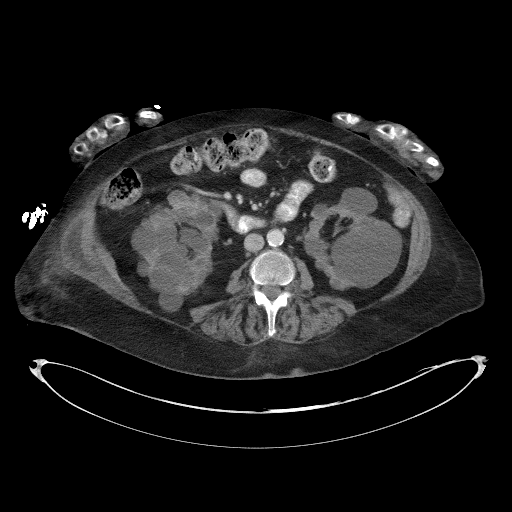
[im 55/98  soft-tissue]
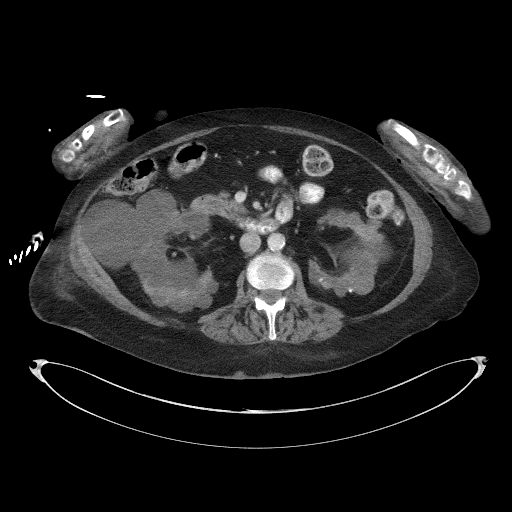
[im 61/98  soft-tissue]
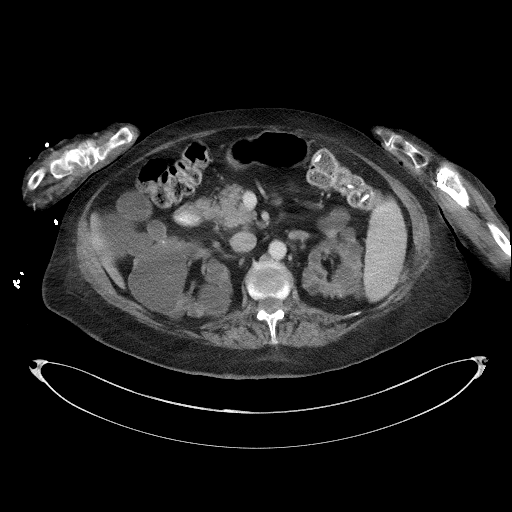
[im 61/98  bone]
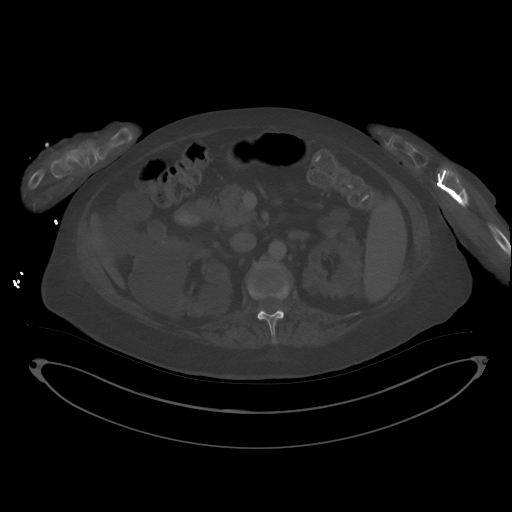
[im 67/98  soft-tissue]
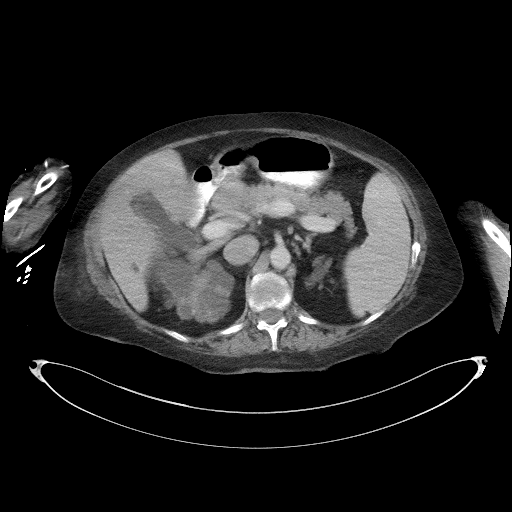
[im 79/98  soft-tissue]
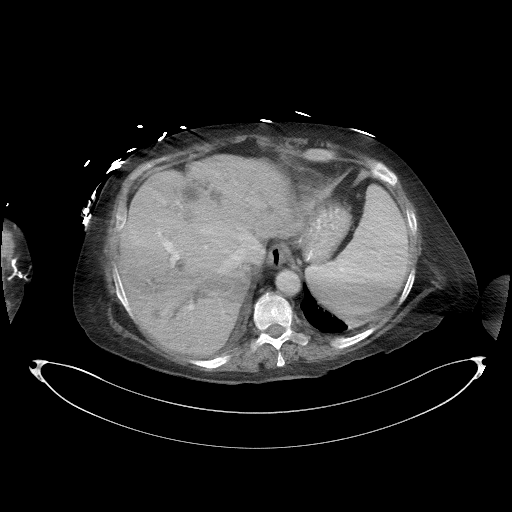
[im 85/98  soft-tissue]
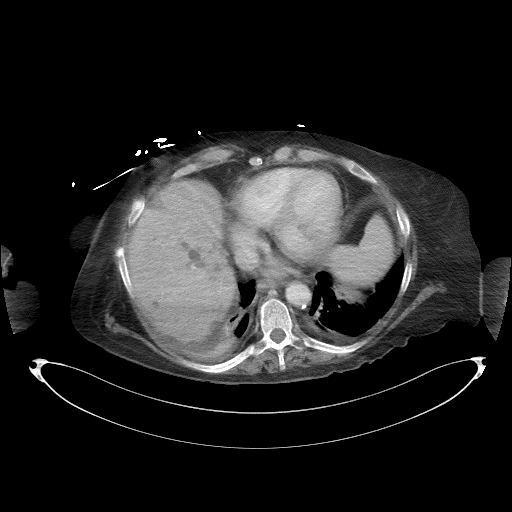
[im 91/98  soft-tissue]
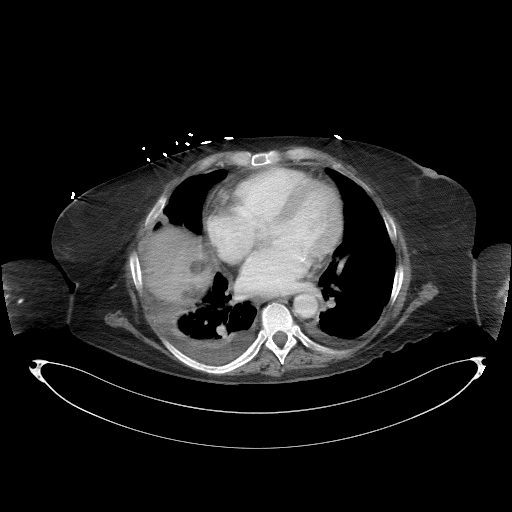

[Series 6: a/p w/ cor · coronal · 0.96mm/px · 3 of 163 slices shown]
[im 55/163  soft-tissue]
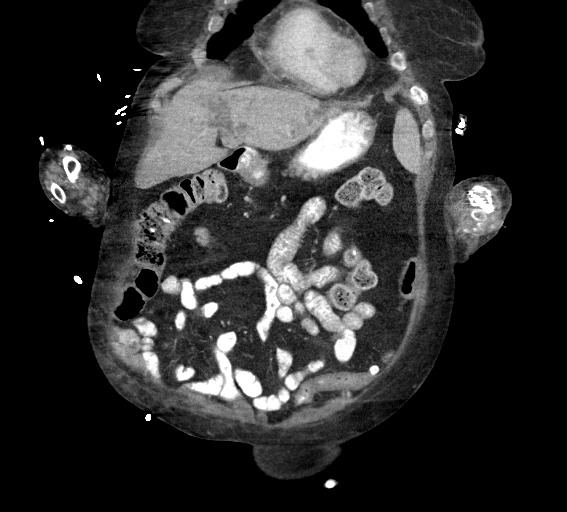
[im 73/163  soft-tissue]
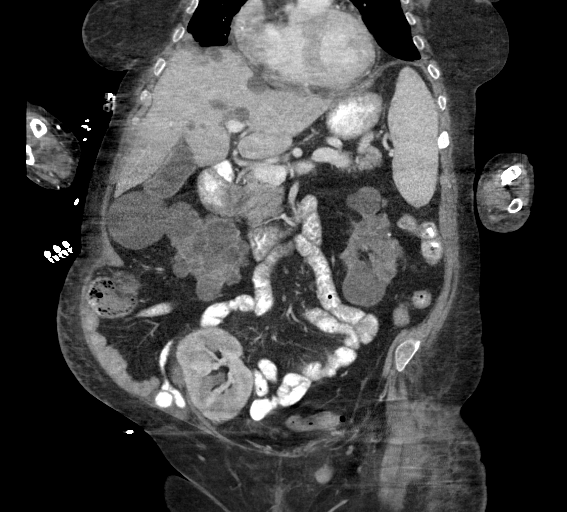
[im 91/163  soft-tissue]
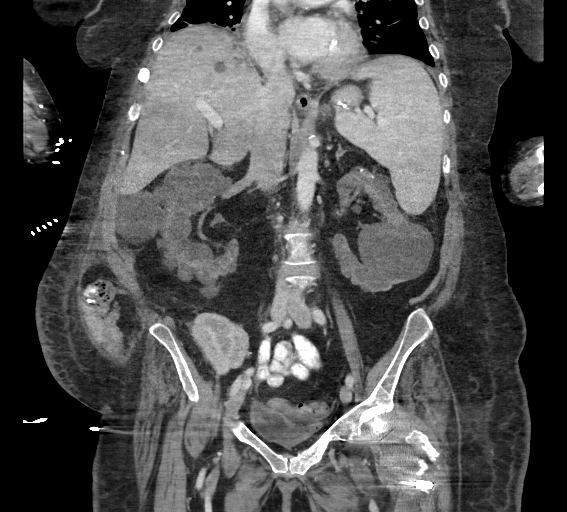

[16 of 46 positions shown; findings below may reference images not displayed]

FINDINGS: Lower chest: Partially visualized small bilateral pleural effusions
with associated bibasilar atelectasis. Please see report for chest
CT.

No intra-abdominal free air or free fluid.

Hepatobiliary: Multiple hepatic hypodense lesions measure up to 3
cm. The larger lesions demonstrate fluid attenuation most consistent
with cysts and smaller lesions are not well characterized. No
calcified gallstone. There is a small pericholecystic fluid.

Pancreas: Unremarkable. No pancreatic ductal dilatation or
surrounding inflammatory changes.

Spleen: Top-normal spleen size measuring up to 14 cm in craniocaudal
length.

Adrenals/Urinary Tract: The adrenal glands unremarkable. Innumerable
bilateral renal cysts essentially replacing the renal parenchyma
consistent with polycystic kidney disease. There is no
hydronephrosis on either side. There is a right lower quadrant renal
transplant. There is no hydronephrosis or nephrolithiasis of the
transplant kidney. No peritransplant fluid collection. The urinary
bladder is decompressed around a Foley catheter.

Stomach/Bowel: There is moderate stool throughout the colon. There
is no bowel obstruction or active inflammation. The appendix is
normal.

Vascular/Lymphatic: Mild aortoiliac atherosclerotic disease. The IVC
is unremarkable. No portal venous gas. There is no adenopathy.

Reproductive: The uterus is retroflexed. No adnexal masses.

Other: Small fat containing umbilical hernia. Mild diffuse
subcutaneous edema.

Musculoskeletal: Osteopenia with degenerative changes of the spine.
Total left hip arthroplasty. Old left pubic bone fracture as well as
old sacral fractures. No acute osseous pathology.
IMPRESSION: 1. No acute intra-abdominal or pelvic pathology.
2. Polycystic kidney disease with a right lower quadrant renal
transplant. No hydronephrosis or nephrolithiasis.
3. No bowel obstruction. Normal appendix.
4. Small bilateral pleural effusions with associated bibasilar
atelectasis. Please see report for chest CT.
5. Aortic Atherosclerosis ([id number]).

## 2021-11-18 IMAGING — DX DG CHEST 1V PORT
1 series · 1 of 1 positions shown · non-contrast
Comparison: 04/23/2020

CLINICAL DATA: Shortness of breath

EXAM:
PORTABLE CHEST 1 VIEW

[chest ap]
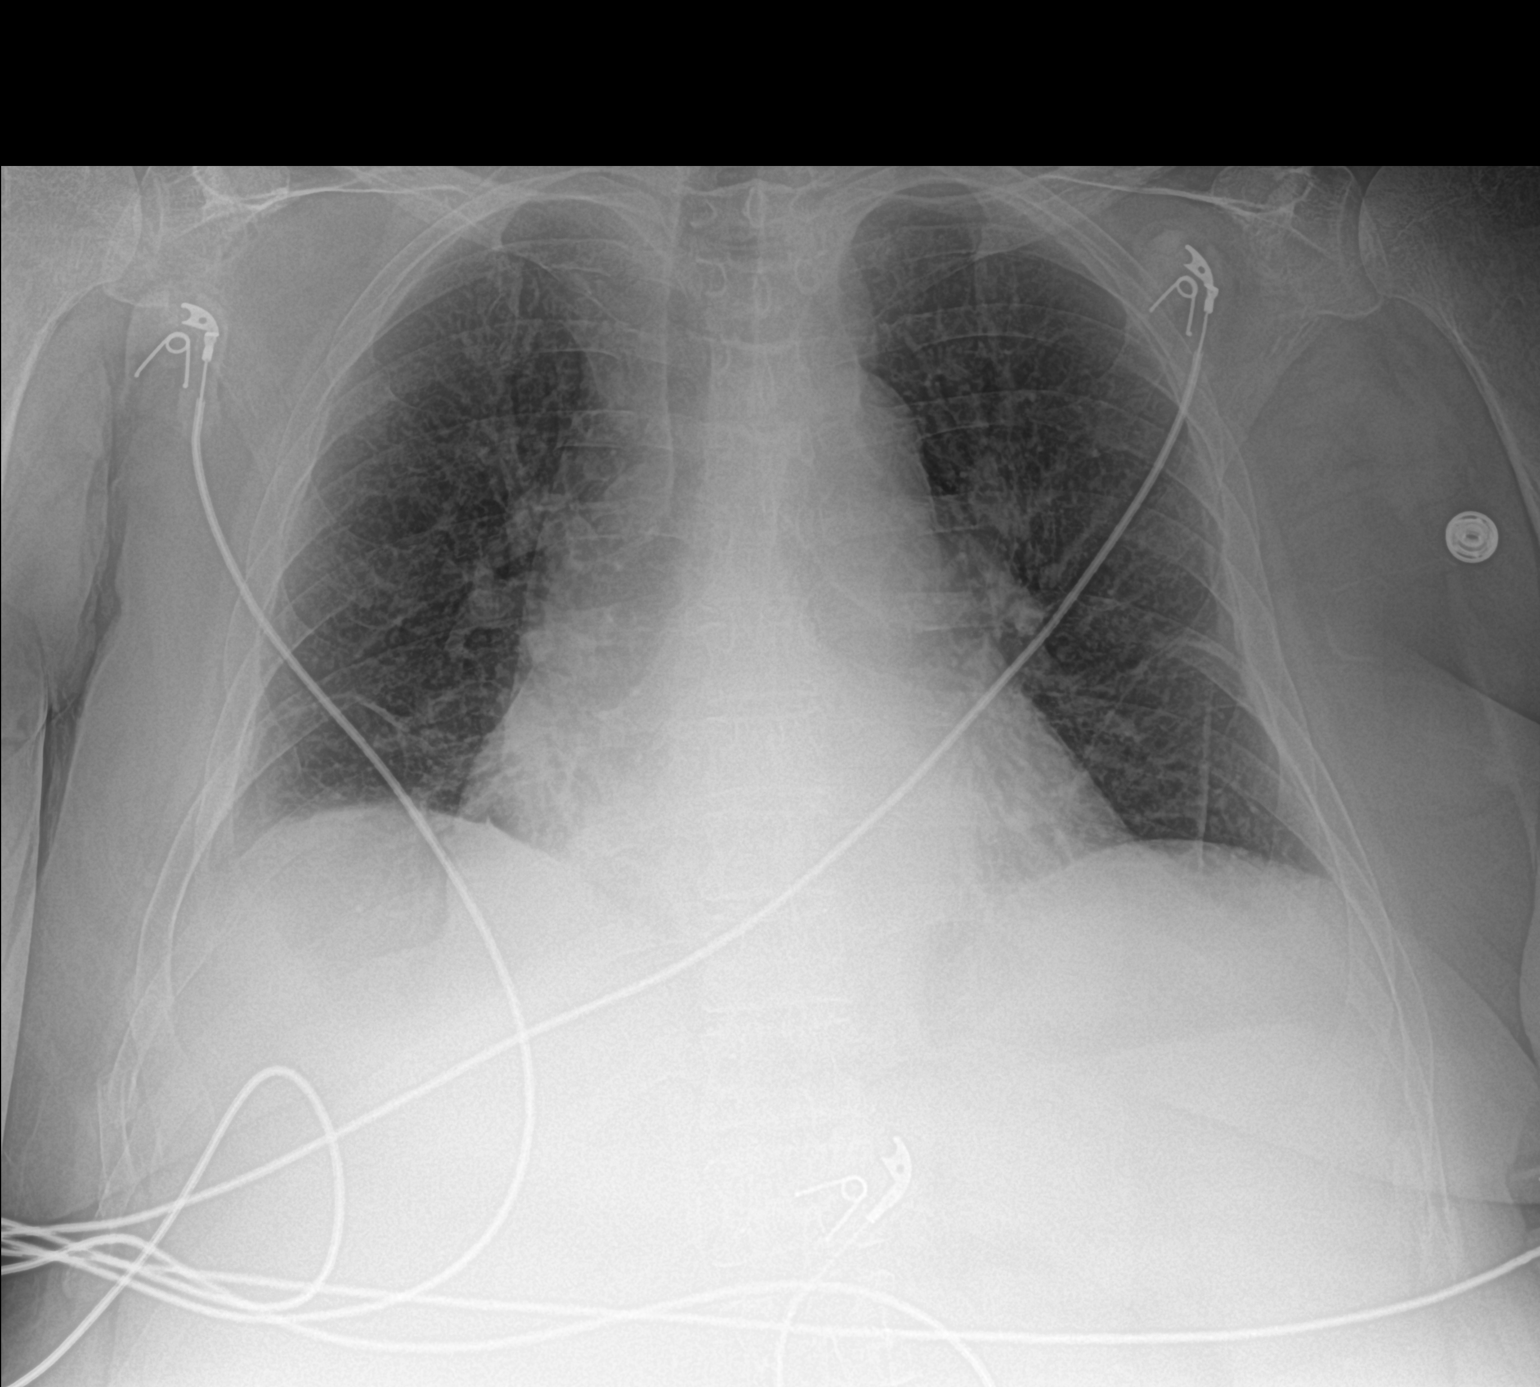

[1 of 1 positions shown; findings below may reference images not displayed]

FINDINGS: Cardiac shadow is mildly prominent but stable accentuated by the
portable technique. Overall inspiratory effort is again poor
although previously seen atelectasis has improved. No focal
confluent infiltrate or sizable effusion is noted. No bony
abnormality is seen.
IMPRESSION: No acute abnormality noted.

## 2021-11-20 IMAGING — DX DG CHEST 1V PORT
1 series · 1 of 1 positions shown · non-contrast
Comparison: One-view chest x-ray 05/14/2020. CT chest without
contrast 05/14/2020.

CLINICAL DATA: Shortness of breath.

EXAM:
PORTABLE CHEST 1 VIEW

[chest]
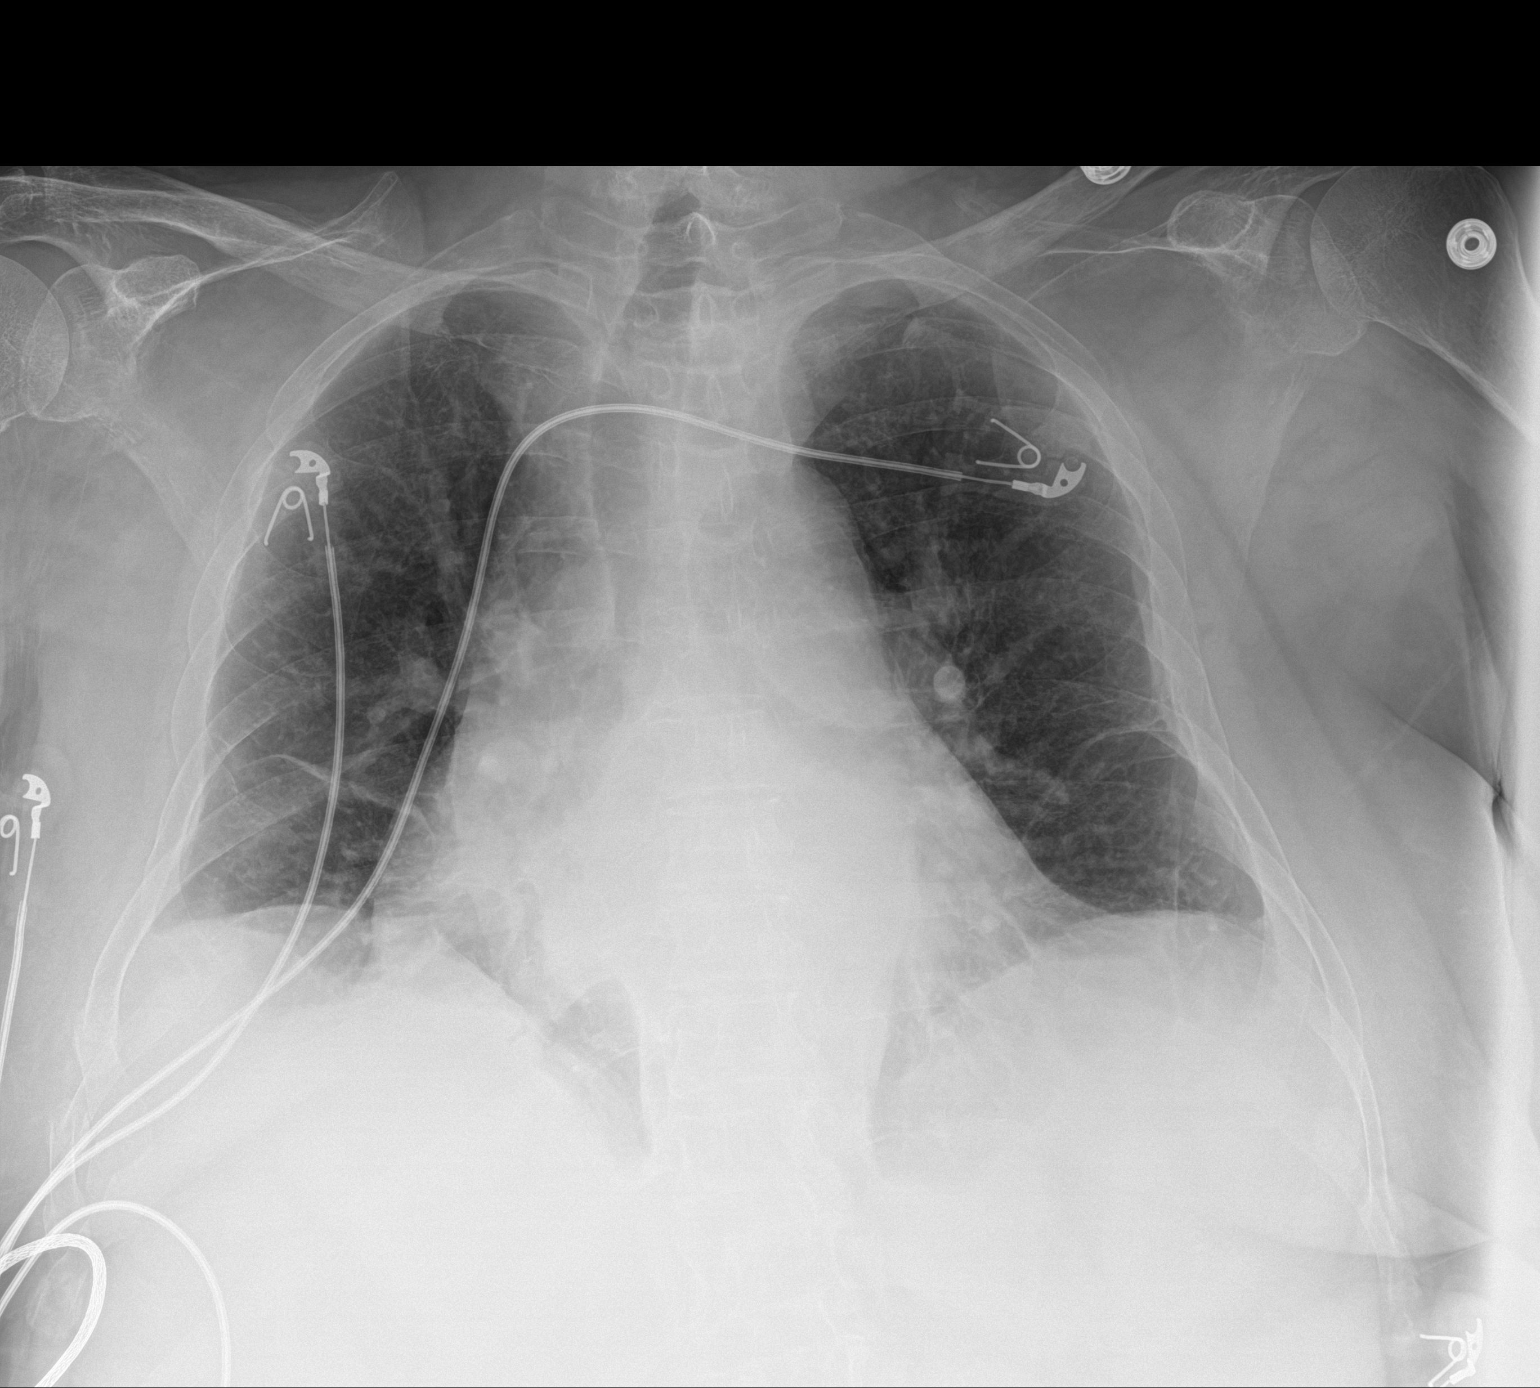

[1 of 1 positions shown; findings below may reference images not displayed]

FINDINGS: The a heart size is normal. Mild pulmonary vascular congestion is
noted. Small right pleural effusion and atelectasis is again noted.
No new airspace disease is present.
IMPRESSION: 1. Stable appearance of small right pleural effusion and
atelectasis.
2. Mild pulmonary vascular congestion.

## 2021-12-01 IMAGING — DX DG CHEST 1V PORT
1 series · 1 of 1 positions shown · non-contrast
Comparison: May 16, 2020

CLINICAL DATA: Chest pain and shortness of breath

EXAM:
PORTABLE CHEST 1 VIEW

[chest]
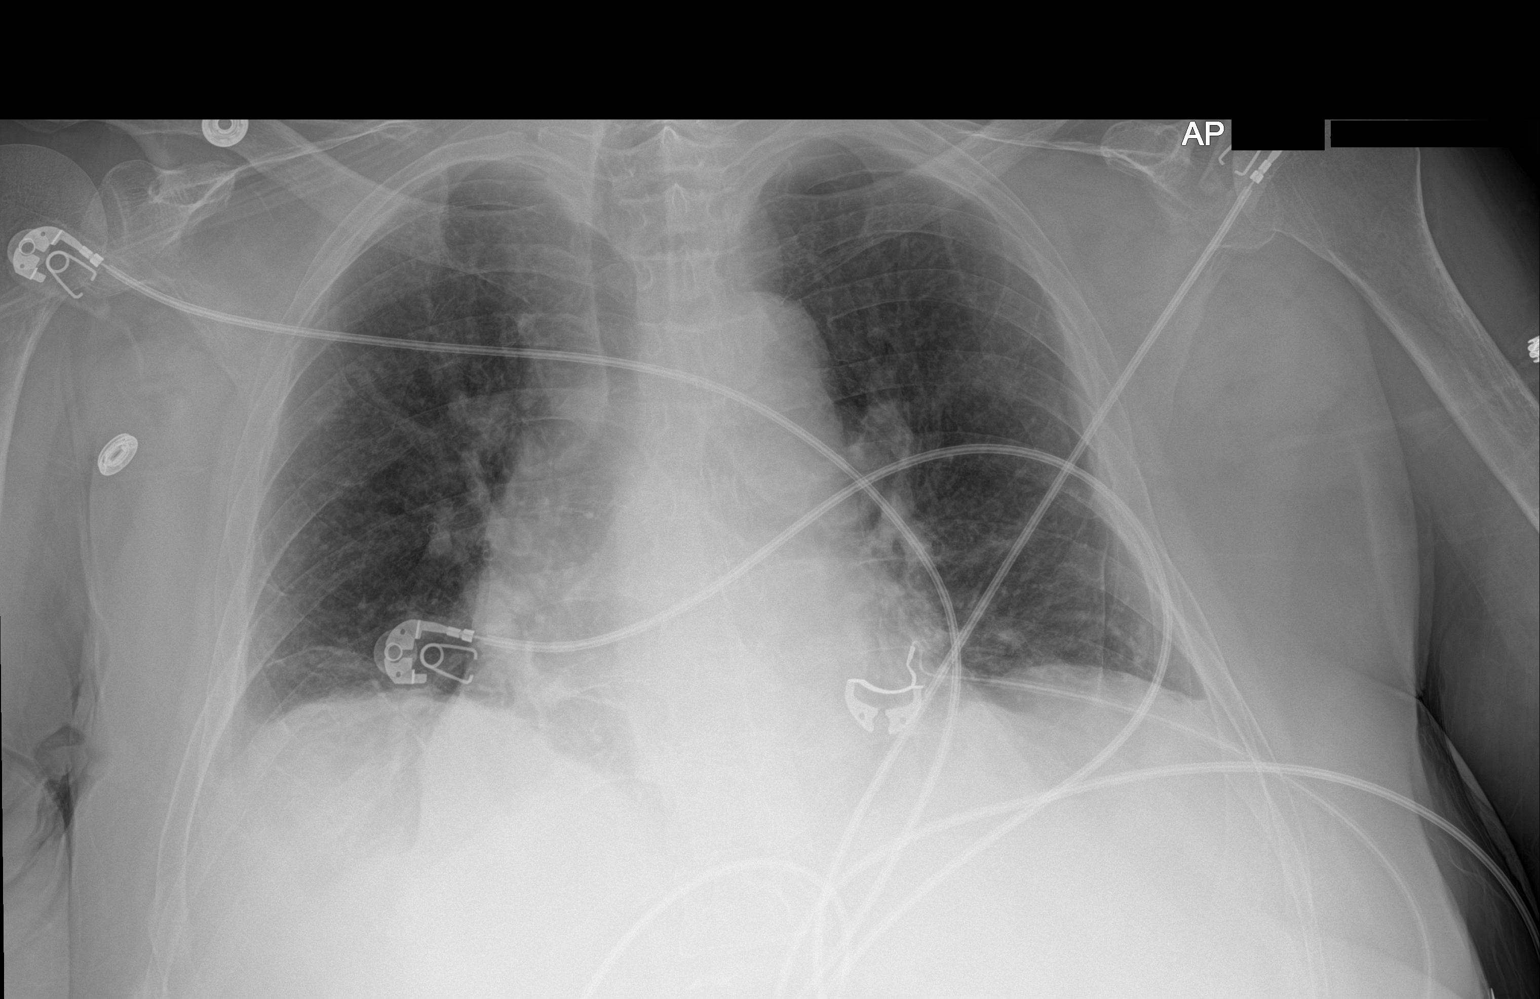

[1 of 1 positions shown; findings below may reference images not displayed]

FINDINGS: The heart size and mediastinal contours are stable. The heart size
is enlarged. Both lungs are clear. The visualized skeletal
structures are unremarkable.
IMPRESSION: No active cardiopulmonary disease.

## 2021-12-05 IMAGING — CT CT BIOPSY BONE MARROW
1 of 2 series · 15 of 32 positions shown, 19 images · non-contrast
Comparison: none

CLINICAL DATA: Renal failure.  Fever of unknown origin.

EXAM:
CT GUIDED DEEP ILIAC BONE ASPIRATION AND CORE BIOPSY
TECHNIQUE: Patient was placed prone on the CT gantry and limited axial scans
through the pelvis were obtained. Appropriate skin entry site was
identified. Skin site was marked, prepped with chlorhexidine, draped
in usual sterile fashion, and infiltrated locally with 1% lidocaine.

[Series 2: i-spiral 5.0 b40f · axial · 0.87mm/px · z∈[-590,-411]mm · 15 of 57 slices shown, 19 images]
[im 4/57  soft-tissue]
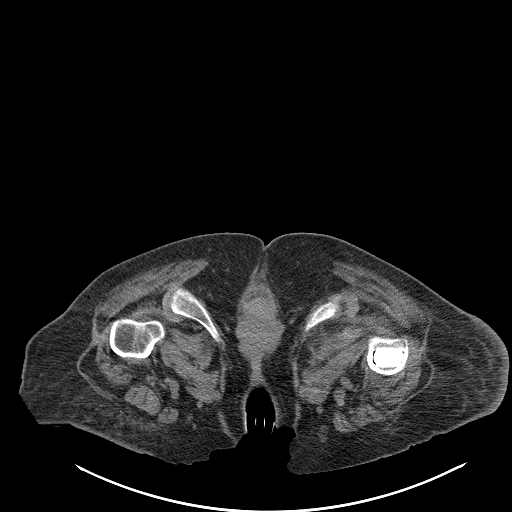
[im 4/57  bone]
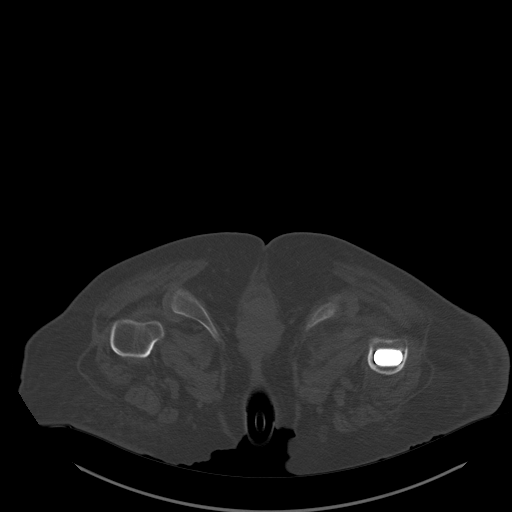
[im 8/57  soft-tissue]
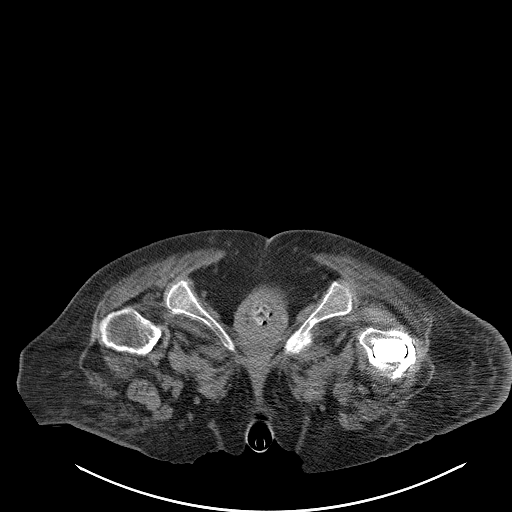
[im 12/57  soft-tissue]
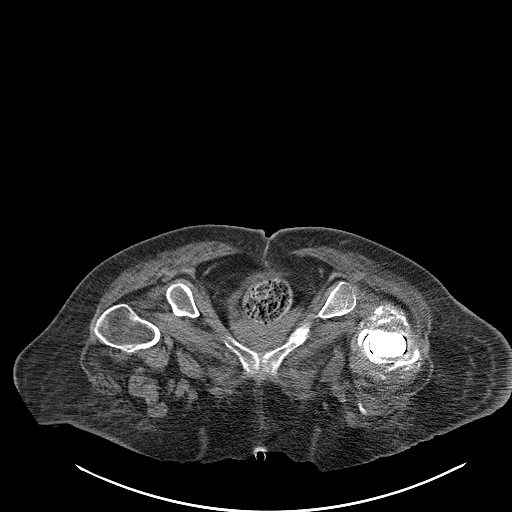
[im 16/57  soft-tissue]
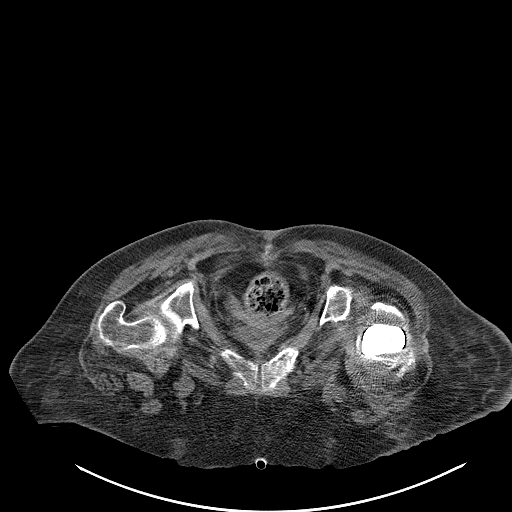
[im 20/57  soft-tissue]
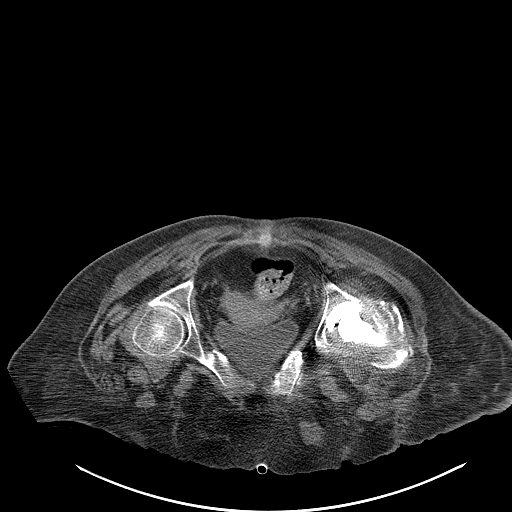
[im 24/57  soft-tissue]
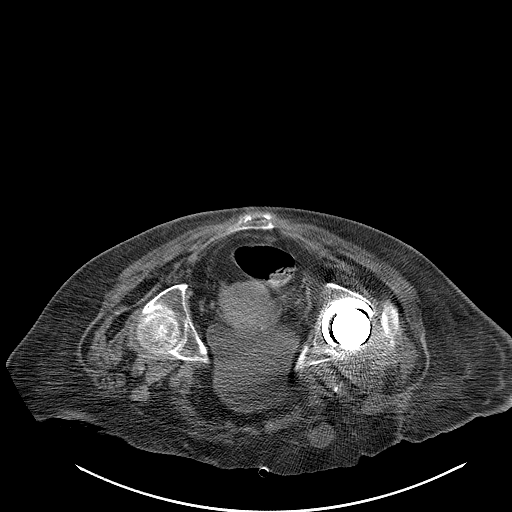
[im 29/57  soft-tissue]
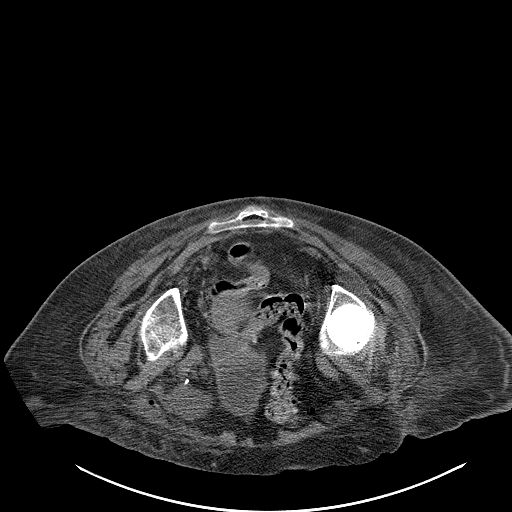
[im 33/57  soft-tissue]
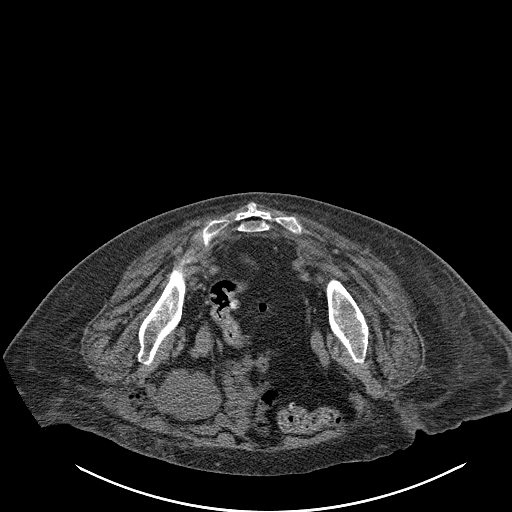
[im 37/57  soft-tissue]
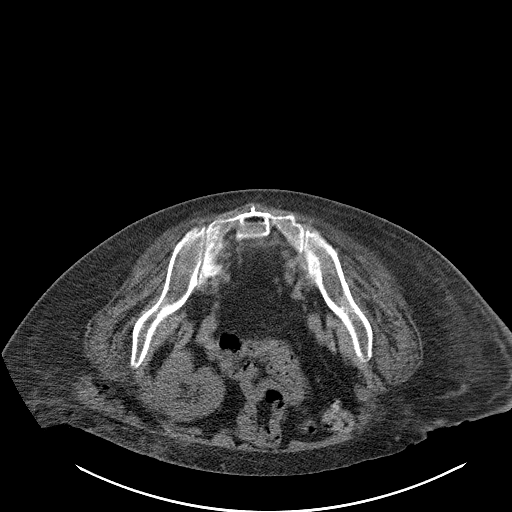
[im 37/57  bone]
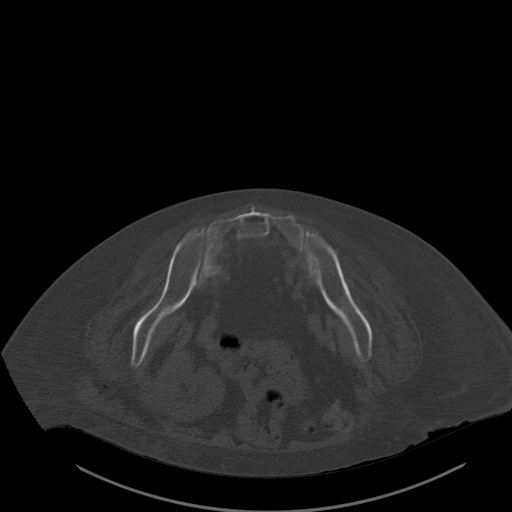
[im 41/57  soft-tissue]
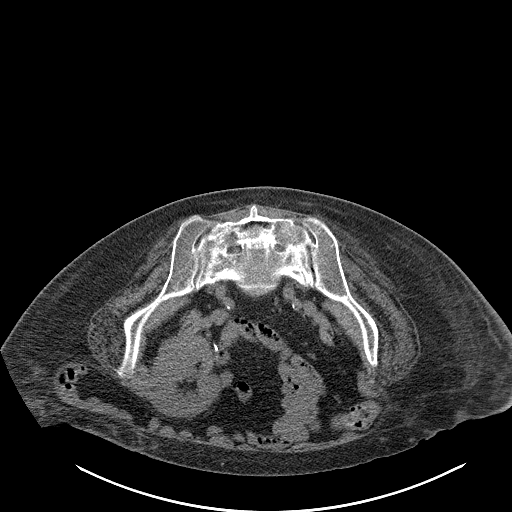
[im 45/57  soft-tissue]
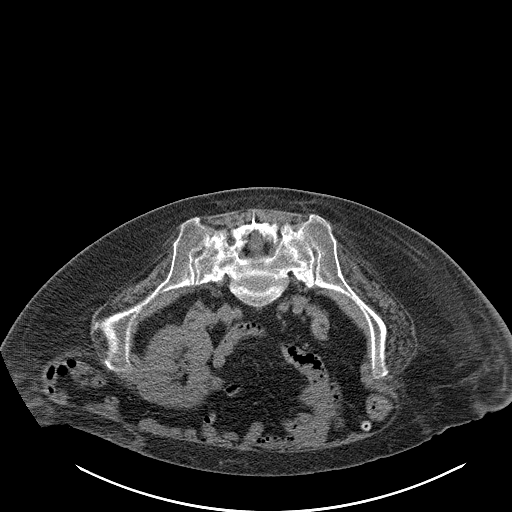
[im 49/57  soft-tissue]
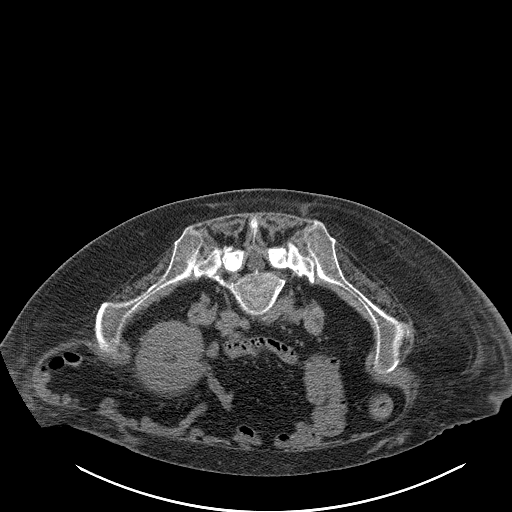
[im 49/57  lung]
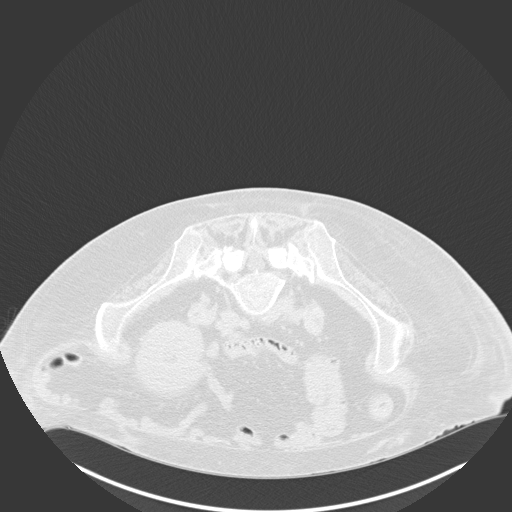
[im 51/57  lung]
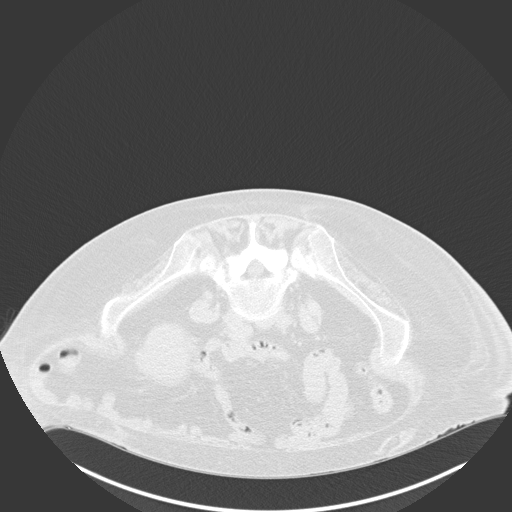
[im 53/57  soft-tissue]
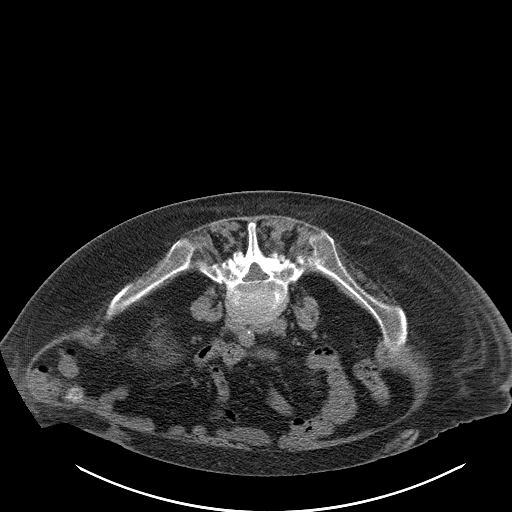
[im 53/57  lung]
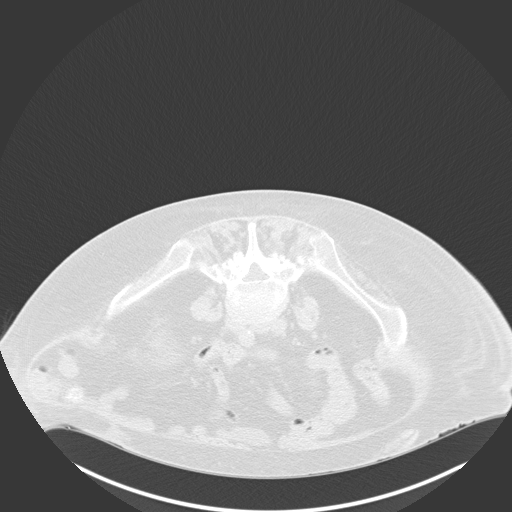
[im 55/57  lung]
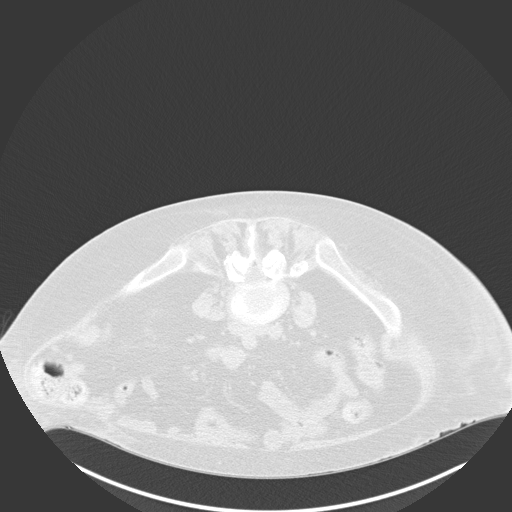

[15 of 32 positions shown; findings below may reference images not displayed]

Intravenous Fentanyl 94mcg and Versed 1.5mg were administered as
conscious sedation during continuous monitoring of the patient's
level of consciousness and physiological / cardiorespiratory status
by the radiology RN, with a total moderate sedation time of 13
minutes.

Under CT fluoroscopic guidance an 11-gauge Cook trocar bone needle
was advanced into the right iliac bone just lateral to the
sacroiliac joint. Once needle tip position was confirmed, core and
aspiration samples were obtained, submitted to pathology for
approval. Additional core was sent for requested microbiologic
analysis. Patient tolerated procedure well.

COMPLICATIONS:
COMPLICATIONS
none
IMPRESSION: 1. Technically successful CT guided right iliac bone core and
aspiration biopsy.

## 2021-12-10 IMAGING — CR DG CHEST 2V
2 series · 2 of 2 positions shown · non-contrast
Comparison: 05/27/2020

CLINICAL DATA: Chest tightness for several days

EXAM:
CHEST - 2 VIEW

[chest lat]
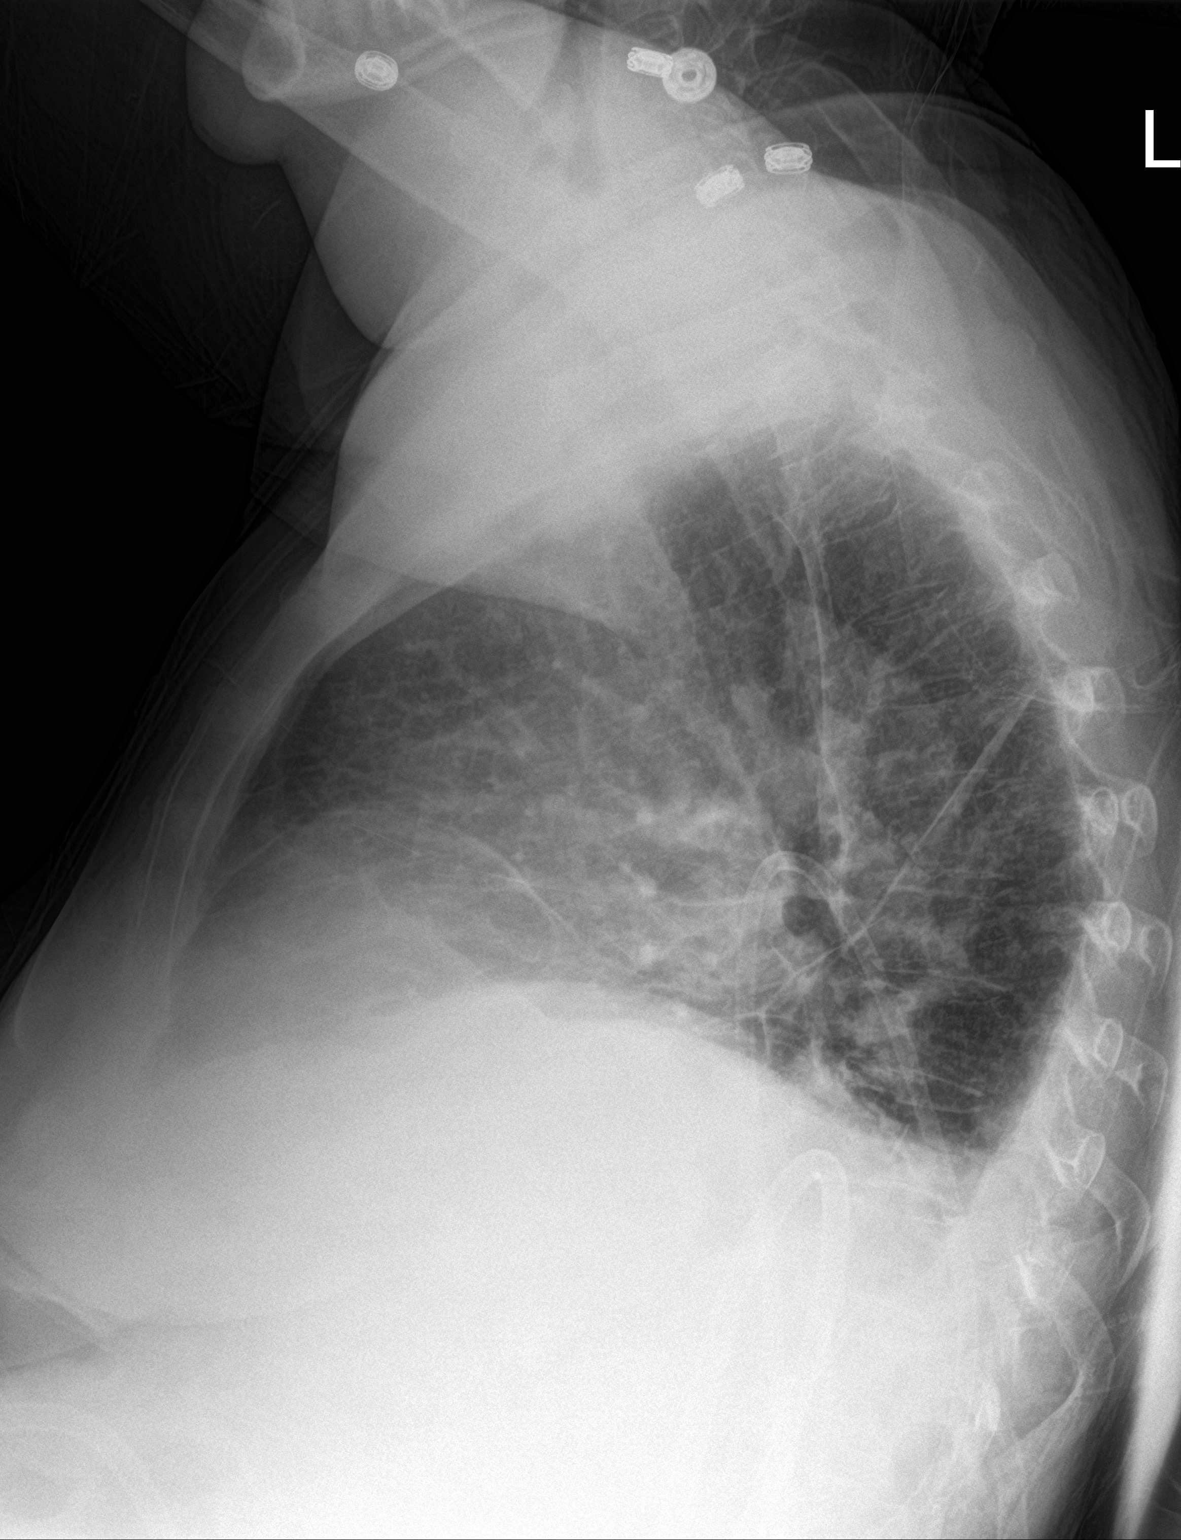

[chest ap]
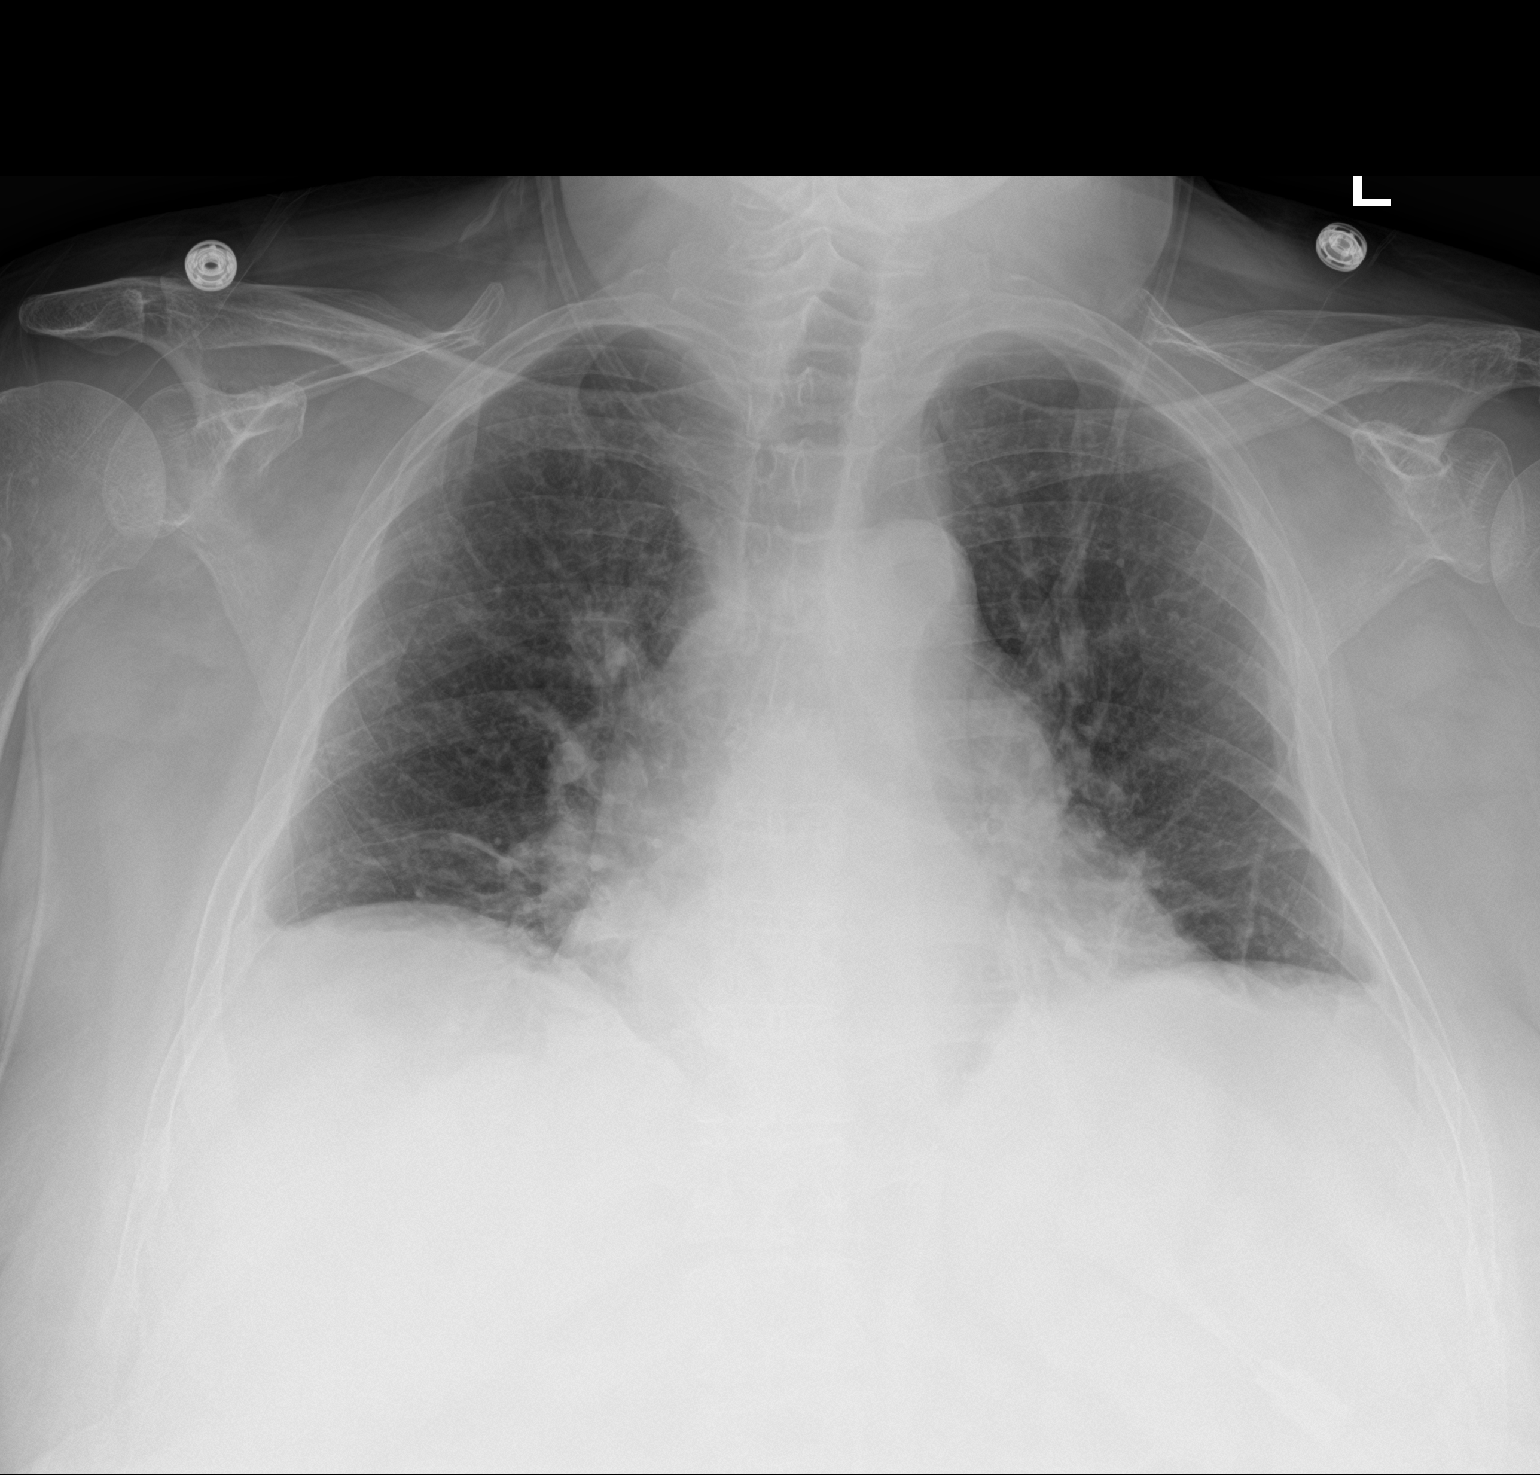

[2 of 2 positions shown; findings below may reference images not displayed]

FINDINGS: Frontal and lateral views of the chest demonstrate an unremarkable
cardiac silhouette. Lung volumes are diminished with scattered areas
of bibasilar atelectasis. Trace bilateral pleural effusions are
noted. No pneumothorax.
IMPRESSION: 1. Trace bilateral pleural effusions.
2. Hypoventilatory changes.  No acute airspace disease.

## 2021-12-10 IMAGING — NM NM PULMONARY PERF PARTICULATE
8 series · 8 of 8 positions shown · non-contrast
Comparison: 06/05/2020

CLINICAL DATA: Hypoxemia, chest tightness for several days

EXAM:
NUCLEAR MEDICINE PERFUSION LUNG SCAN
TECHNIQUE: Perfusion images were obtained in multiple projections after
intravenous injection of radiopharmaceutical.
Ventilation scans intentionally deferred if perfusion scan and chest
x-ray adequate for interpretation during COVID 19 epidemic.
RADIOPHARMACEUTICALS:  3.9 mCi Vc-IIm MAA IV

[Series 1: ant/post perf · 4.14mm/px · 1 of 1 slices shown (1 of 2)]
[im 1/1]
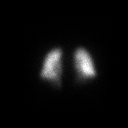

[Series 1: ant/post perf · 4.14mm/px · 1 of 1 slices shown (2 of 2)]
[im 1/1]
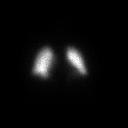

[Series 2: lao/rpo perf · 4.14mm/px · 1 of 1 slices shown (1 of 2)]
[im 1/1]
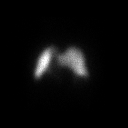

[Series 2: lao/rpo perf · 4.14mm/px · 1 of 1 slices shown (2 of 2)]
[im 1/1]
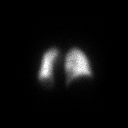

[Series 3: lpo/rao perf · 4.14mm/px · 1 of 1 slices shown (1 of 2)]
[im 1/1]
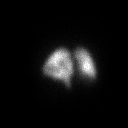

[Series 3: lpo/rao perf · 4.14mm/px · 1 of 1 slices shown (2 of 2)]
[im 1/1]
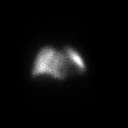

[Series 4: lt lat/rt lat perf · 4.14mm/px · 1 of 1 slices shown (1 of 2)]
[im 1/1]
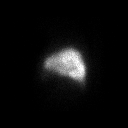

[Series 4: lt lat/rt lat perf · 4.14mm/px · 1 of 1 slices shown (2 of 2)]
[im 1/1]
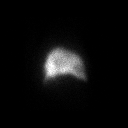

[8 of 8 positions shown; findings below may reference images not displayed]

FINDINGS: Planar images of the chest are obtained during performance of the
perfusion exam. There are no perfusion defects. Normal symmetrical
radiotracer distribution throughout the lungs.
IMPRESSION: 1. Normal perfusion exam.  No evidence of pulmonary embolus.

## 2022-01-05 IMAGING — DX DG CHEST 1V PORT
1 series · 1 of 1 positions shown · non-contrast
Comparison: June 06, 2019

CLINICAL DATA: Worsening shortness of breath.

EXAM:
PORTABLE CHEST 1 VIEW

[chest ap]
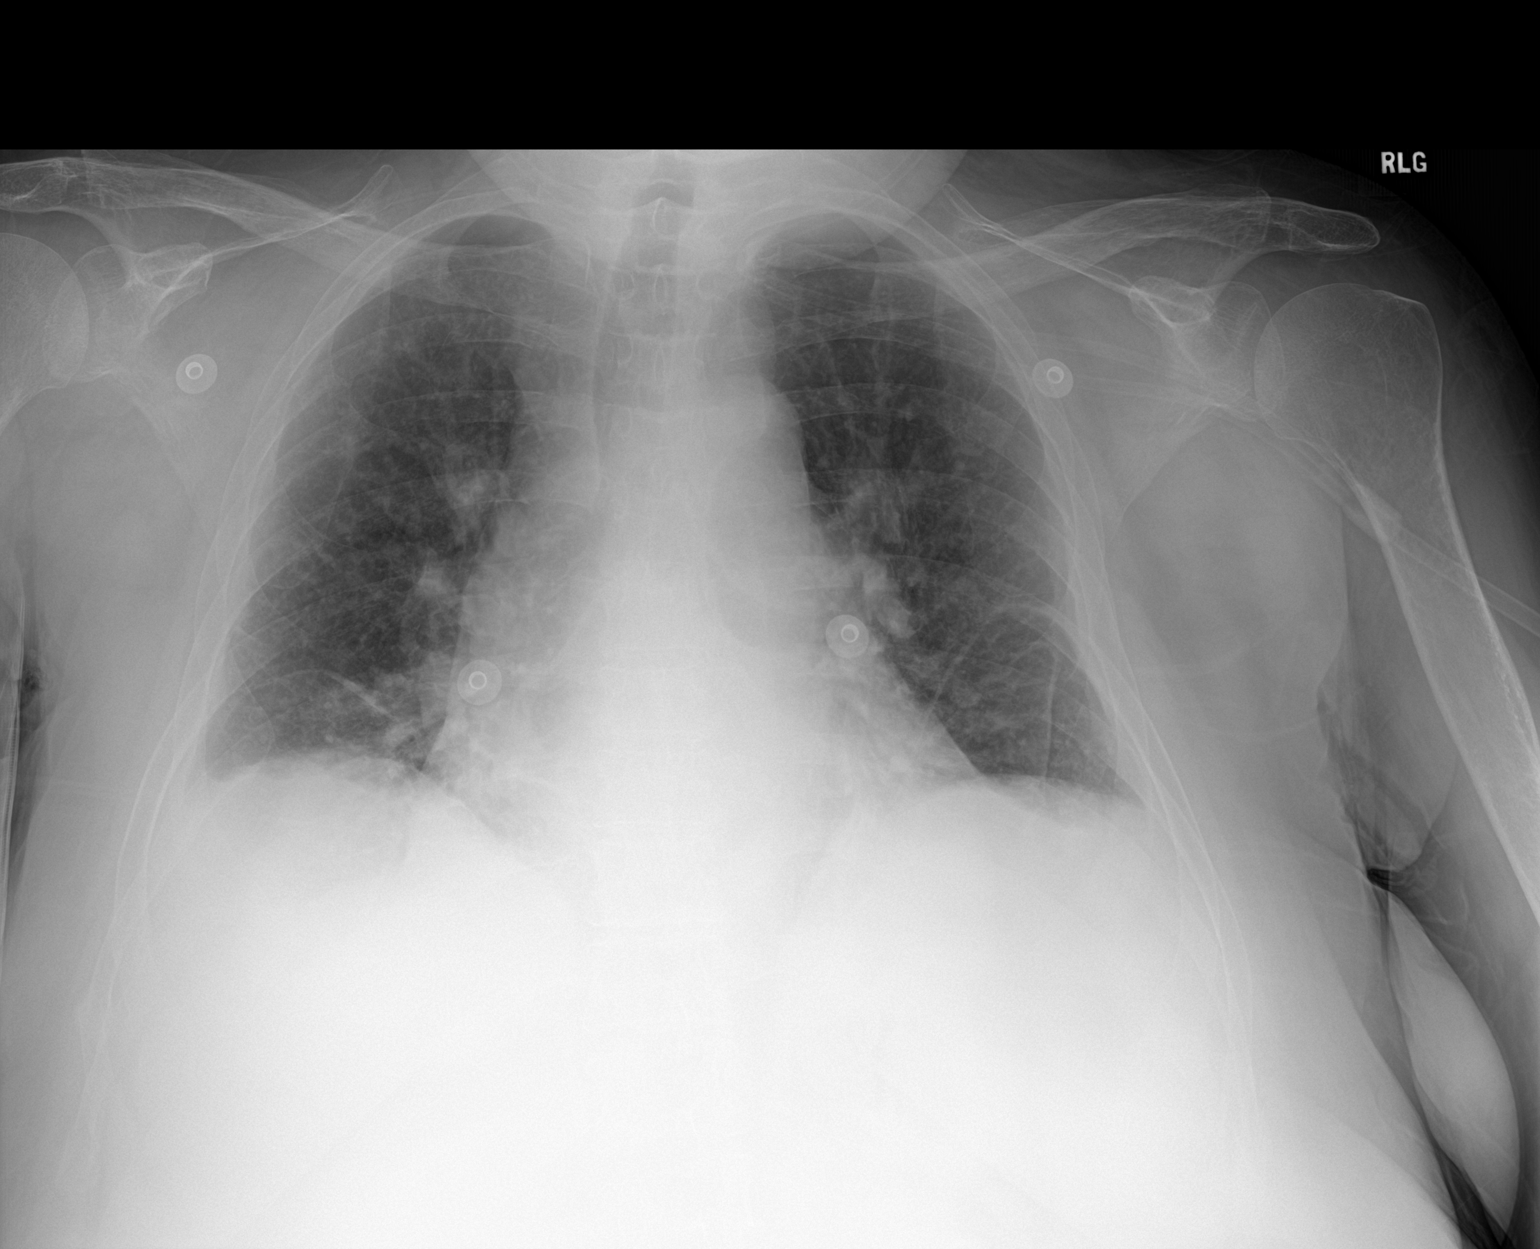

[1 of 1 positions shown; findings below may reference images not displayed]

FINDINGS: The lung volumes are low. There is persistent blunting of the
costophrenic angles bilaterally. The heart size is stable but mildly
enlarged. There is vascular congestion. Stable bibasilar airspace
disease is noted and favored to represent atelectasis. There is no
pneumothorax. There is no acute osseous abnormality.
IMPRESSION: 1. Persistent small bilateral pleural effusions with bibasilar
airspace disease, favored to represent atelectasis.
2. Stable cardiac enlargement with vascular congestion.

## 2022-01-22 IMAGING — CR DG CHEST 2V
2 series · 2 of 2 positions shown · non-contrast
Comparison: Single-view of the chest 07/01/2020 and 05/27/2020.

CLINICAL DATA: Increasing shortness of breath over the past 5 days.

EXAM:
CHEST - 2 VIEW

[w chest lat]
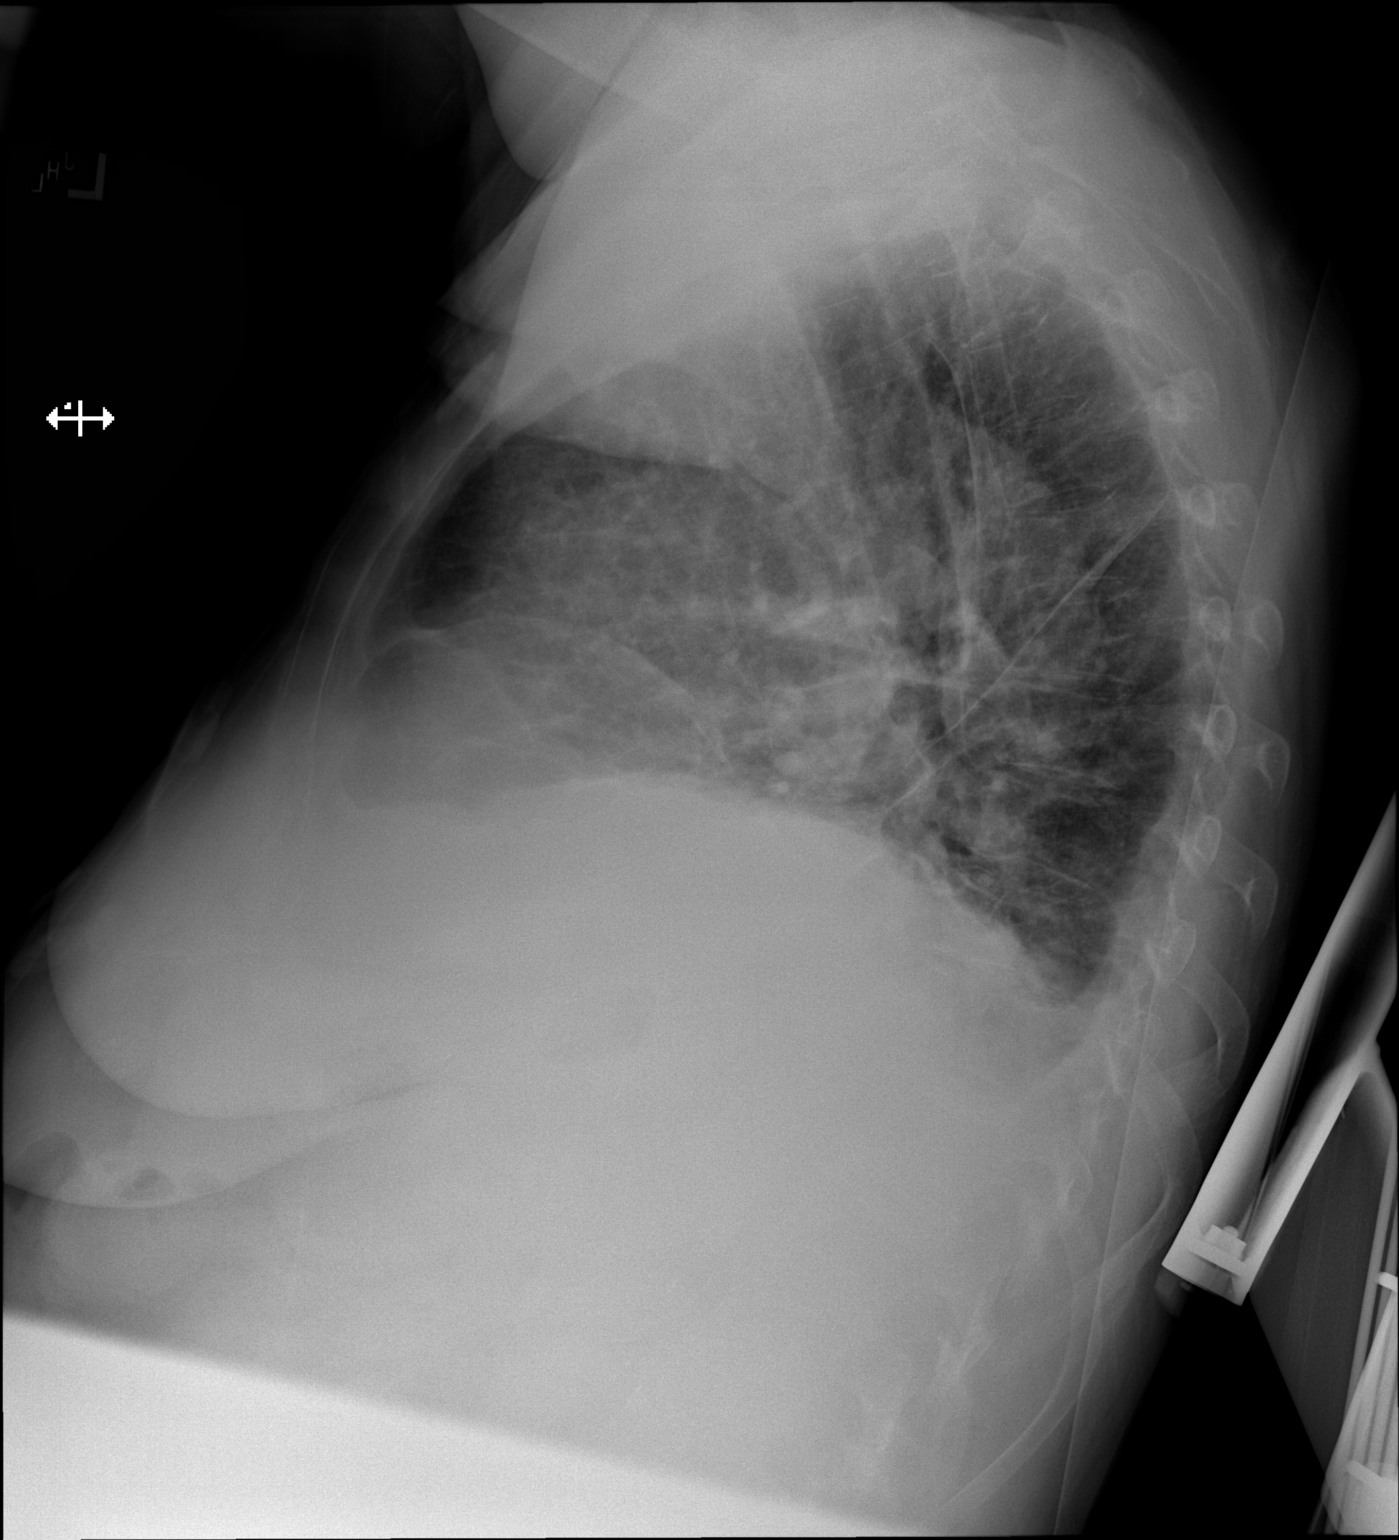

[x chest ap]
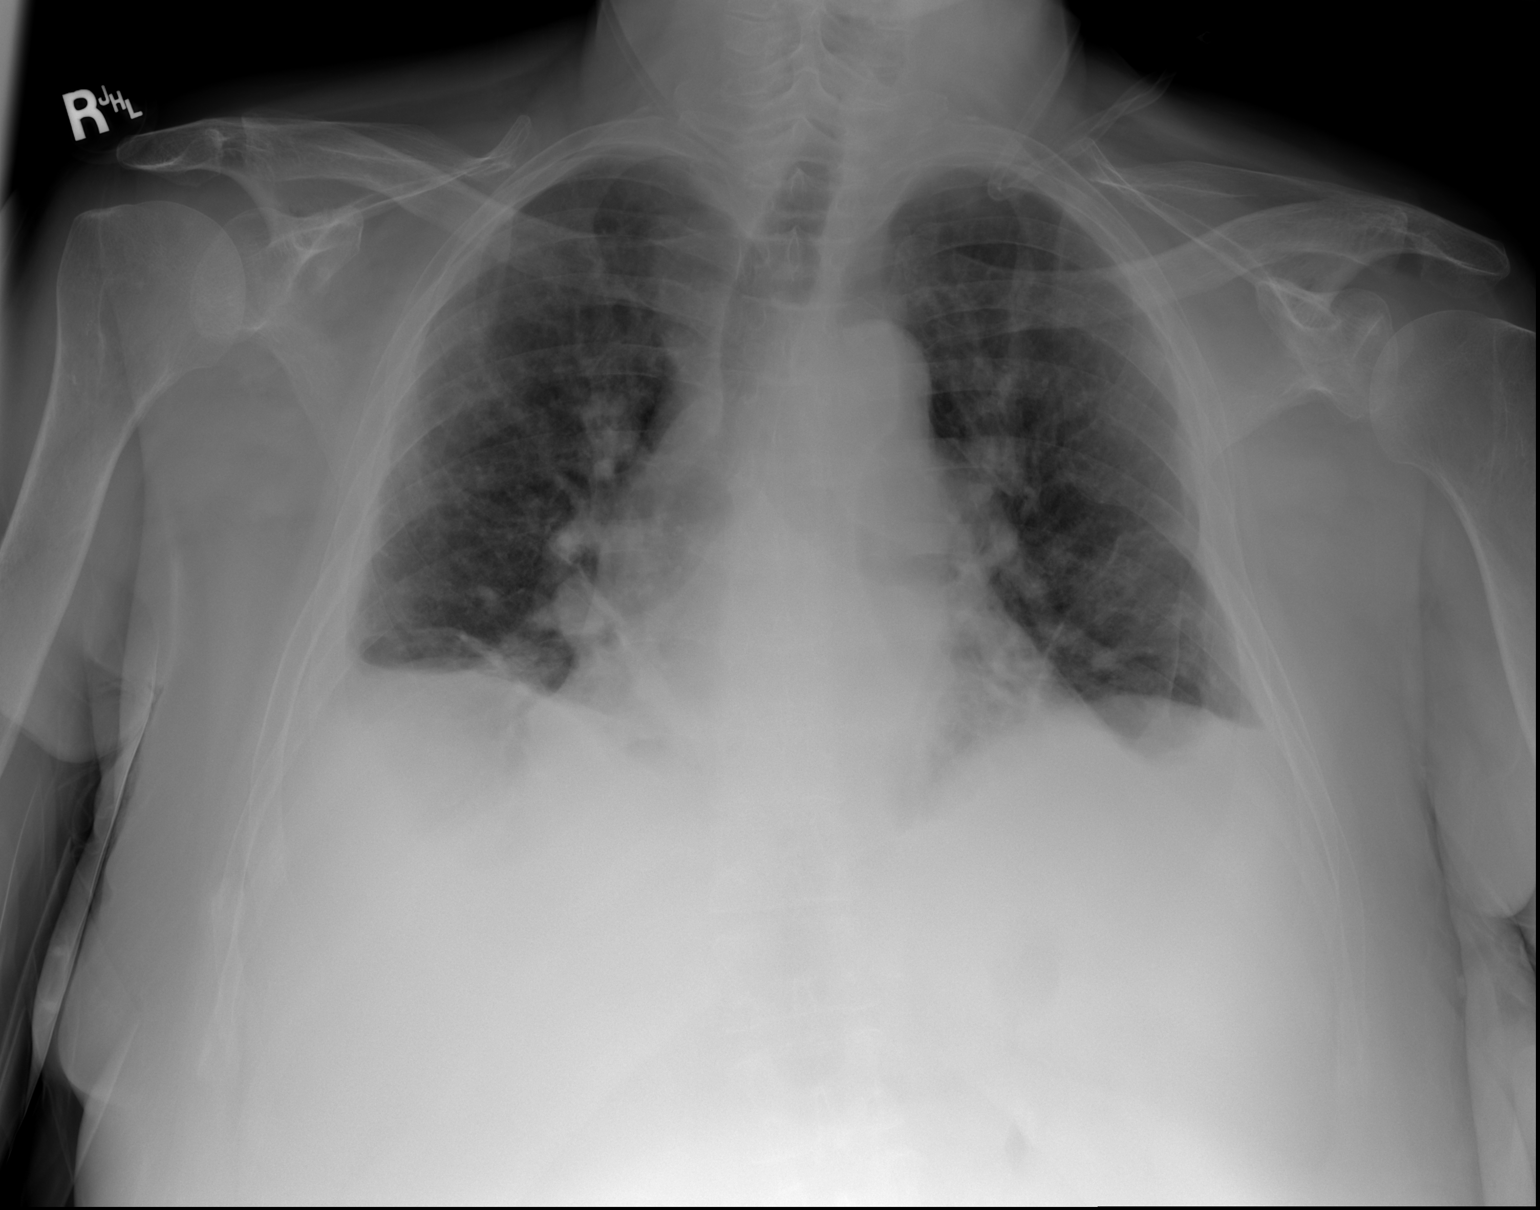

[2 of 2 positions shown; findings below may reference images not displayed]

FINDINGS: The patient has small bilateral pleural effusions, larger on the
right. There is cardiomegaly and mild interstitial edema. No
pneumothorax. No acute bony abnormality.
IMPRESSION: Mild interstitial edema with associated small pleural effusions,
larger on the right.

## 2022-02-05 IMAGING — PT NM PET IMAGE RESTAGE (PS) WHOLE BODY
2 series · 19 of 25 positions shown · non-contrast
Comparison: 04/23/2009 [address] CT scan from 05/14/2020

CLINICAL DATA: Initial treatment strategy for monoclonal
gammopathy.

EXAM:
NUCLEAR MEDICINE PET WHOLE BODY
TECHNIQUE: 8.8 mCi F-18 FDG was injected intravenously. Full-ring PET imaging
was performed from the head to foot after the radiotracer. CT data
was obtained and used for attenuation correction and anatomic
localization.
Fasting blood glucose: 162 mg/dl

[Series 603: range-ct wb 5.0 hd_fov-cor-<alpha range> · 14 of 94 slices shown]
[im 1/94]
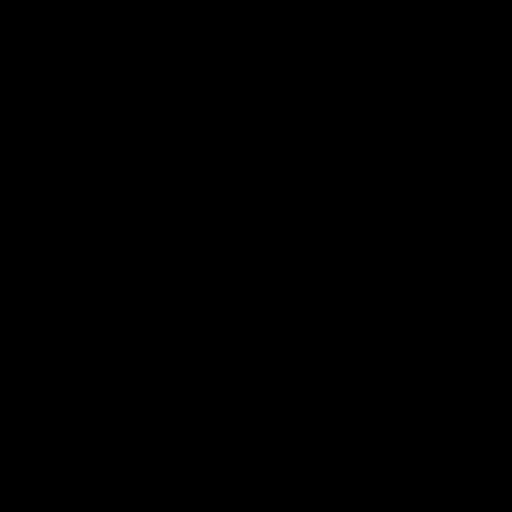
[im 6/94]
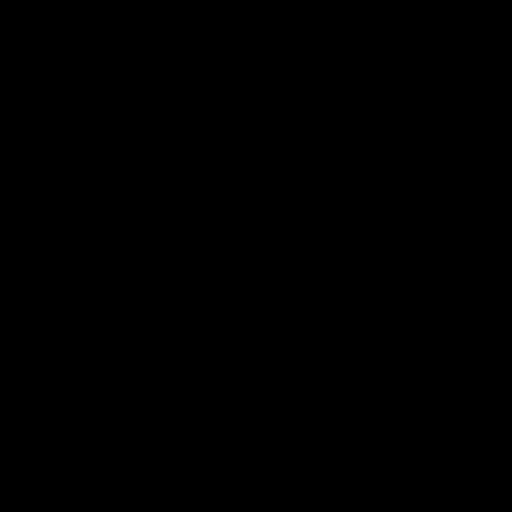
[im 16/94]
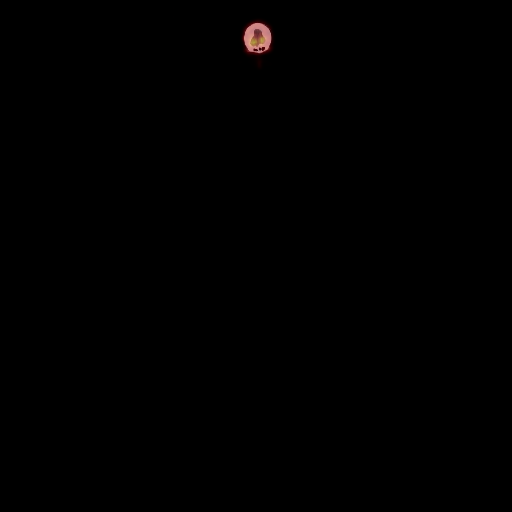
[im 21/94]
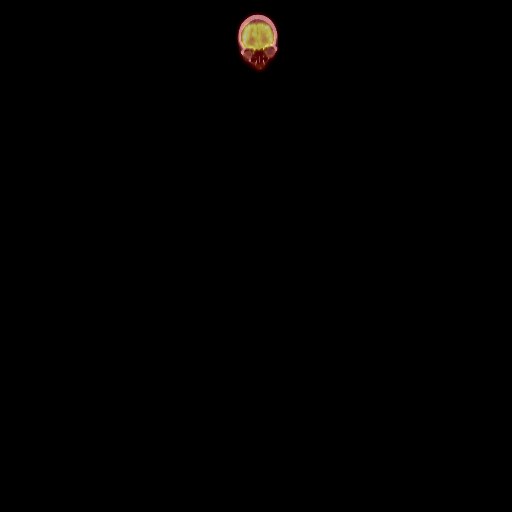
[im 26/94]
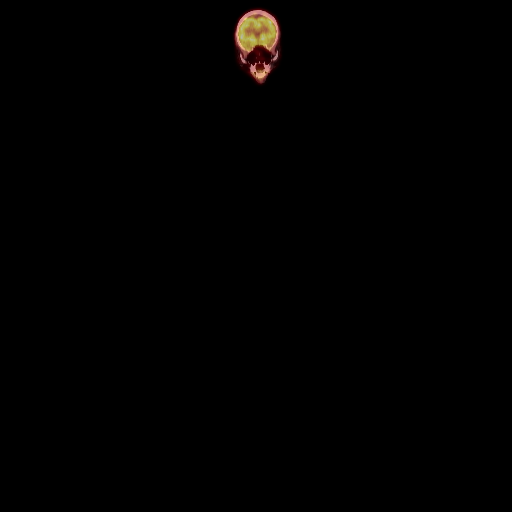
[im 37/94]
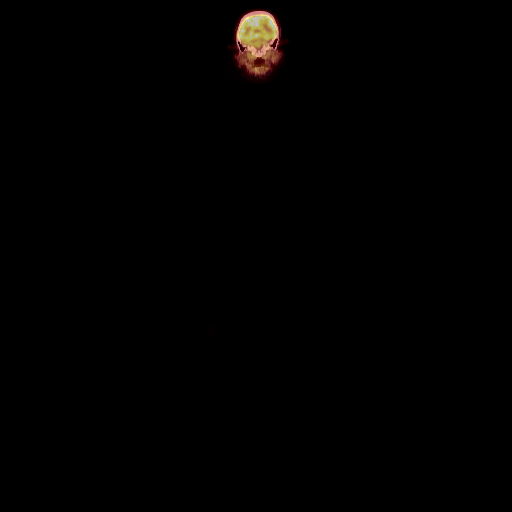
[im 42/94]
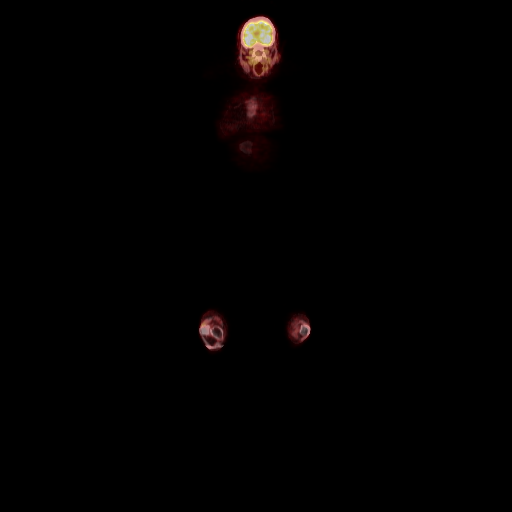
[im 47/94]
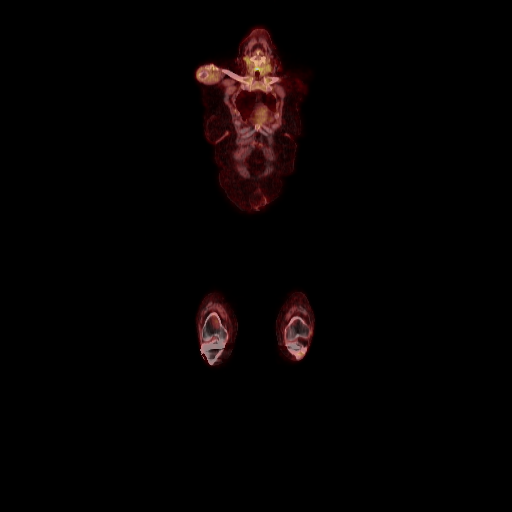
[im 57/94]
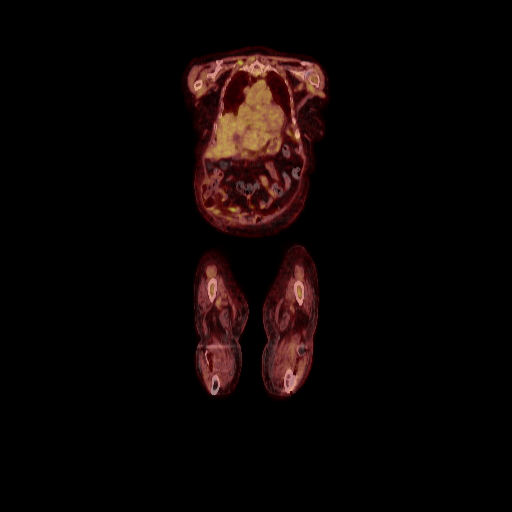
[im 63/94]
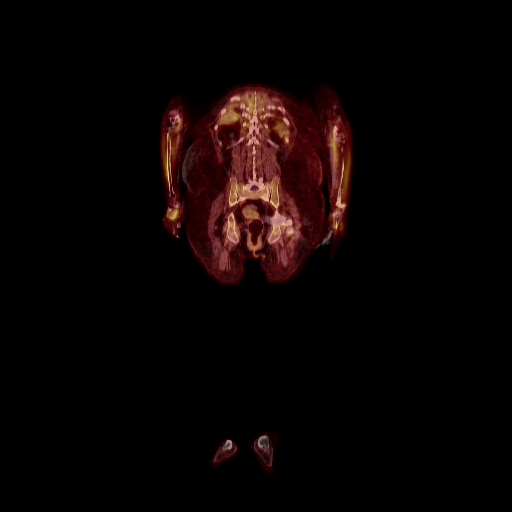
[im 68/94]
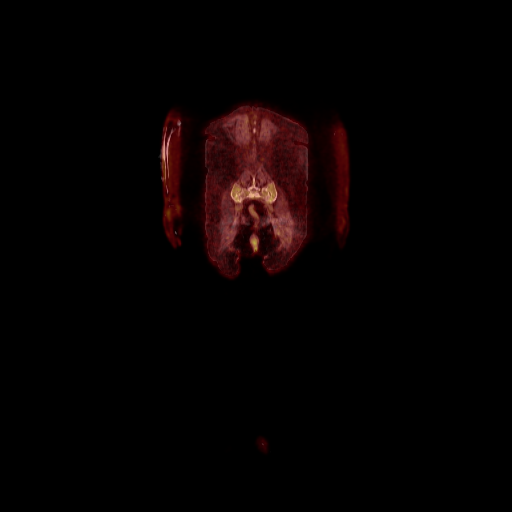
[im 78/94]
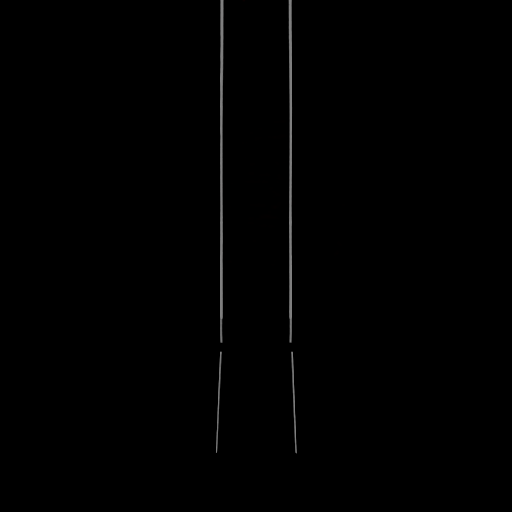
[im 83/94]
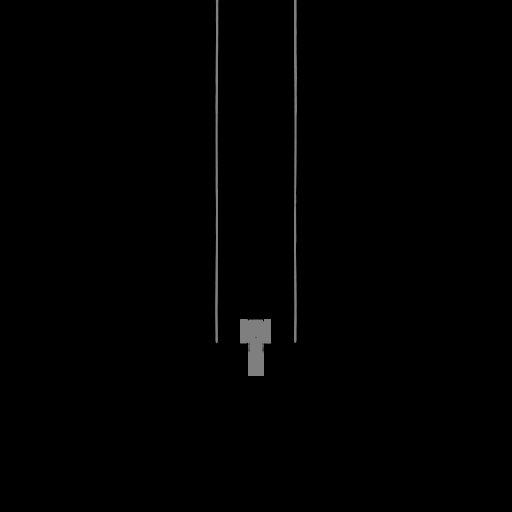
[im 88/94]
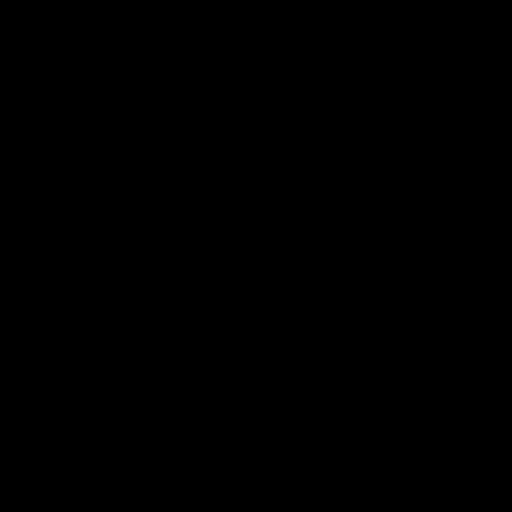

[Series 604: mip range 5 · coronal · 3.24mm/px · 5 of 32 slices shown]
[im 1/32]
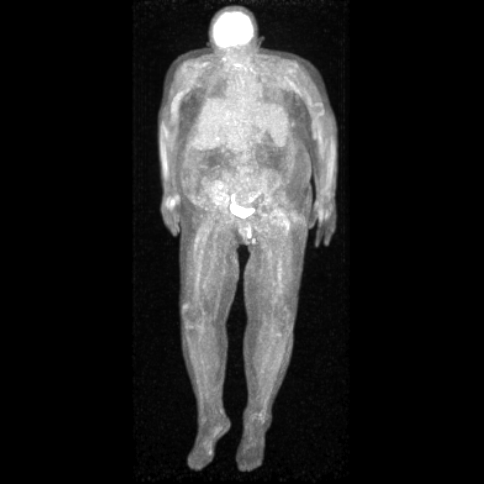
[im 7/32]
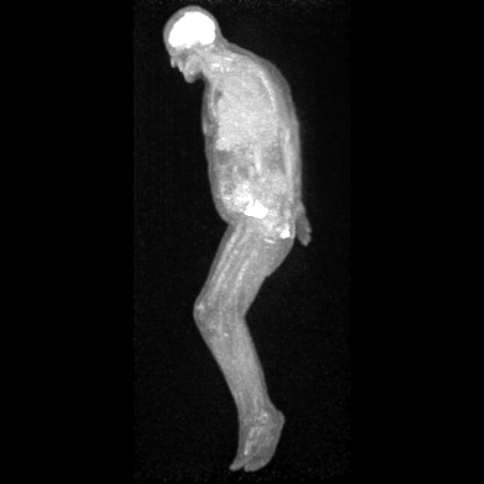
[im 13/32]
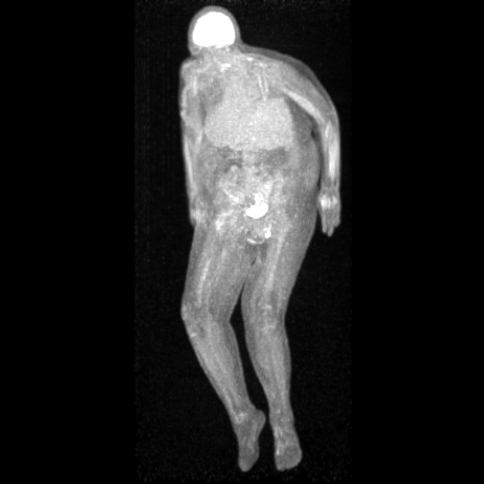
[im 25/32]
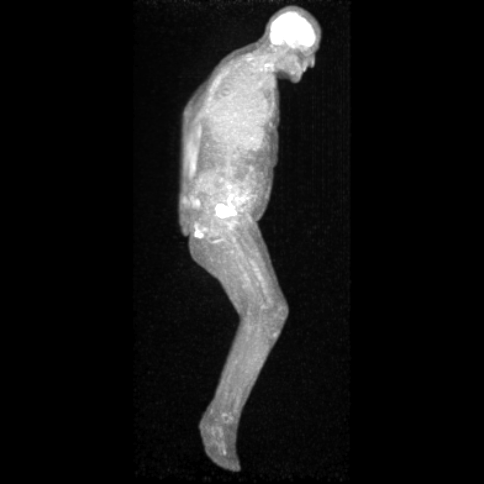
[im 32/32]
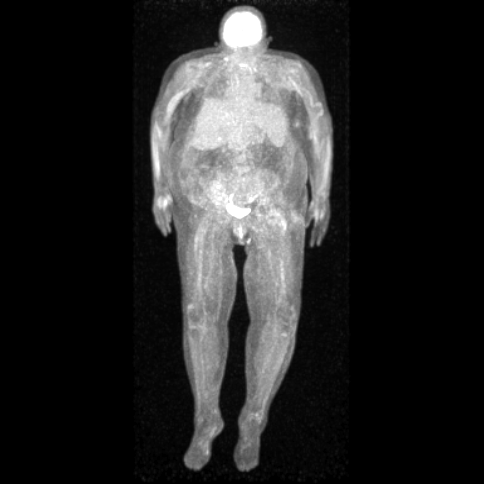

[19 of 25 positions shown; findings below may reference images not displayed]

FINDINGS: Mediastinal blood pool activity: SUV max

HEAD/NECK: Minimal glottic activity thought to be physiologic. No
significant abnormal activity in the head/neck.

Incidental CT findings: none

CHEST: No significant abnormal hypermetabolic activity in this
region.

Incidental CT findings: Atherosclerotic calcification of the aortic
arch. Mild mitral valve calcifications. Mild cardiomegaly. Small
right pleural effusion. No pathologic or hypermetabolic adenopathy.
Dilated venous structures along the left supraclavicular and left
shoulder region. Mild atelectasis in both lung bases.

ABDOMEN/PELVIS: No significant abnormal hypermetabolic activity in
this region.

Incidental CT findings: Innumerable cysts of varying complexity
throughout both kidneys without accentuated metabolic activity.
Scattered photopenic lesions throughout the liver favoring cysts.
The spleen measures 12.6 by 7.5 by 14.1 cm (volume = 700 cm^3),
compatible splenomegaly. Aortoiliac atherosclerotic vascular
disease. Laxity of the right anterior abdominal wall. Small
umbilical hernia contains adipose tissue and a small amount of
chronic fat necrosis. Transplant kidney along the right iliac fossa.
Sigmoid colon diverticulosis.

SKELETON: No significant abnormal hypermetabolic activity in this
region.

Incidental CT findings: Chronic/healed bilateral anterior rib
fractures. Sclerosis compatible with prior pelvic fractures
including the sacral ala and the left pubic rami. Left hip
prosthesis. Bilateral proximal tibial plate and screw fixators.

EXTREMITIES: No significant abnormal hypermetabolic activity in this
region.

Incidental CT findings: Evidence of av graft in the left upper
extremity. Subcutaneous edema along the calves, left greater than
right.
IMPRESSION: 1. No findings of active myeloma. No significant abnormal
hypermetabolic activity.
2. Polycystic kidneys with polycystic liver disease. Transplant
kidney in the right iliac fossa.
3. Other imaging findings of potential clinical significance: Aortic
Atherosclerosis ([id number]). Mitral valve calcifications. Mild
cardiomegaly. Small right pleural effusion. Small umbilical hernia
containing adipose tissue. Sigmoid colon diverticulosis. Prior
pelvic and rib fractures.

## 2022-03-09 IMAGING — CT CT ABD-PELV W/O CM
2 of 4 series · 15 of 46 positions shown, 17 images · non-contrast
Comparison: CT abdomen pelvis dated 05/14/2020.

CLINICAL DATA: 64-year-old female with concern for bowel
obstruction secondary to umbilical hernia.

EXAM:
CT ABDOMEN AND PELVIS WITHOUT CONTRAST
TECHNIQUE: Multidetector CT imaging of the abdomen and pelvis was performed
following the standard protocol without IV contrast.

[Series 2: axial st · axial · 0.79mm/px · z∈[-442,-37]mm · 12 of 93 slices shown, 14 images]
[im 8/93  soft-tissue]
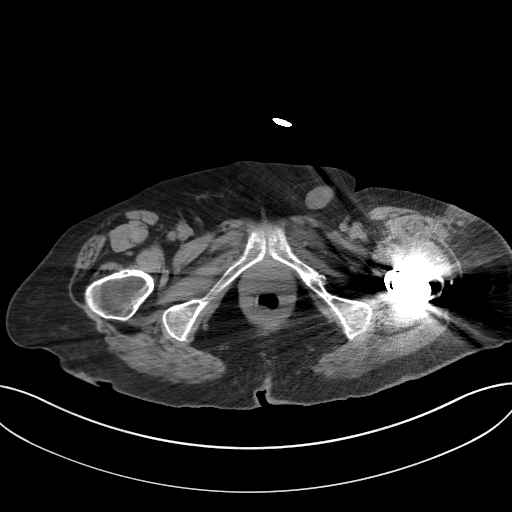
[im 8/93  bone]
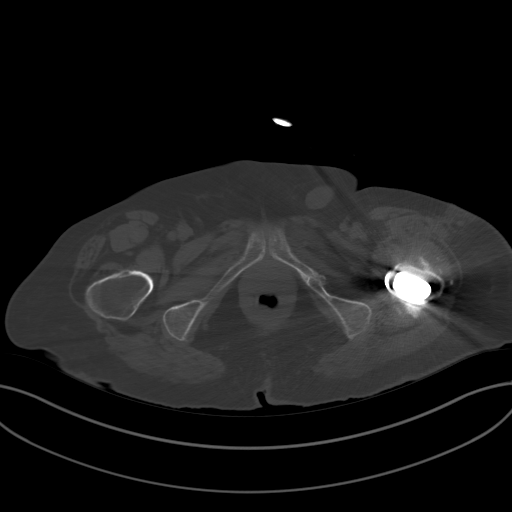
[im 15/93  soft-tissue]
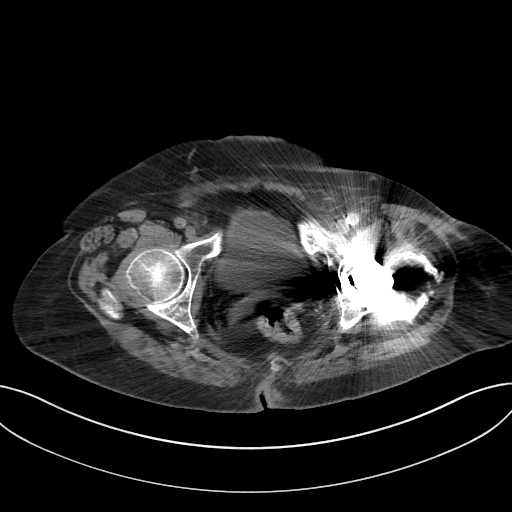
[im 23/93  soft-tissue]
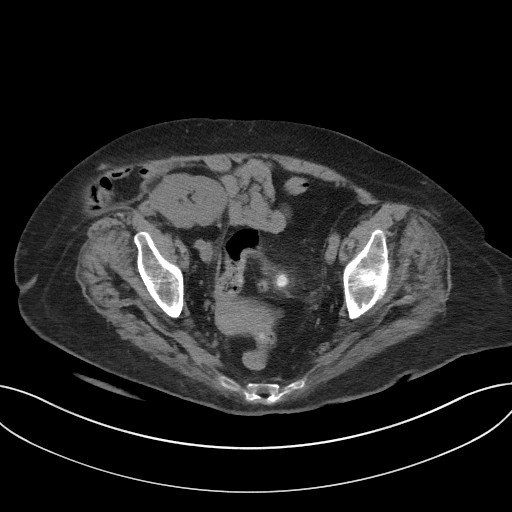
[im 30/93  soft-tissue]
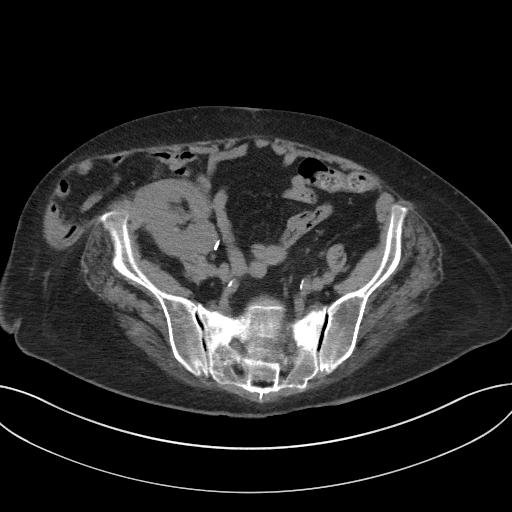
[im 37/93  soft-tissue]
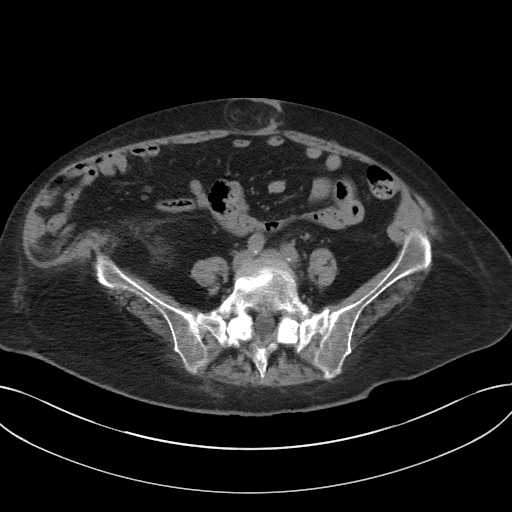
[im 45/93  soft-tissue]
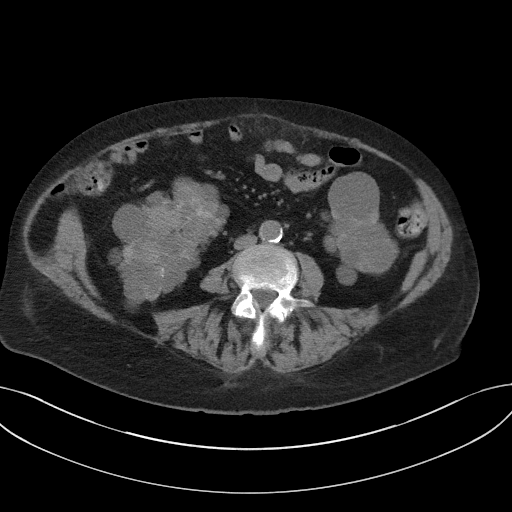
[im 52/93  soft-tissue]
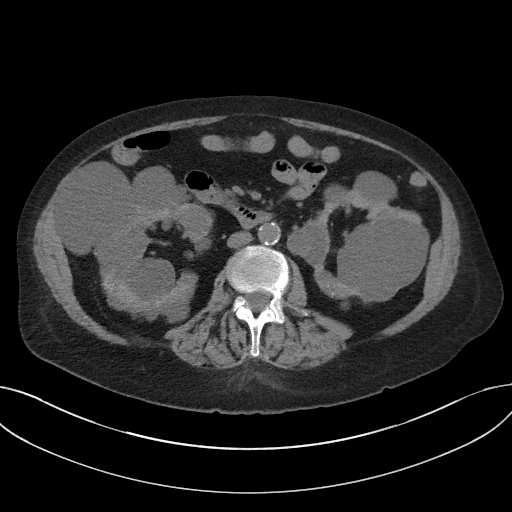
[im 59/93  soft-tissue]
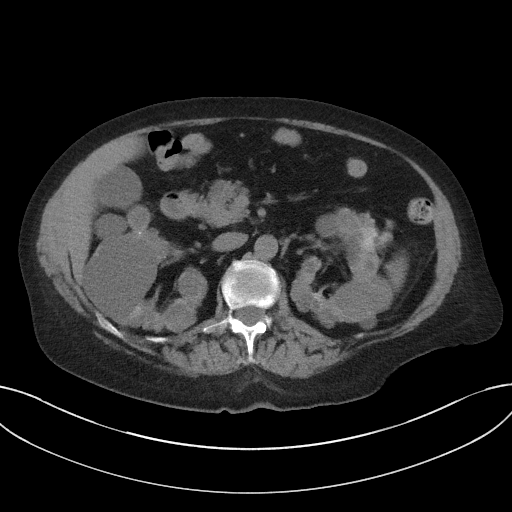
[im 67/93  soft-tissue]
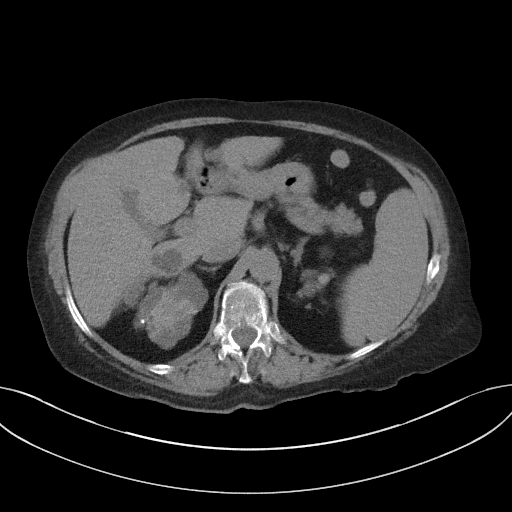
[im 67/93  bone]
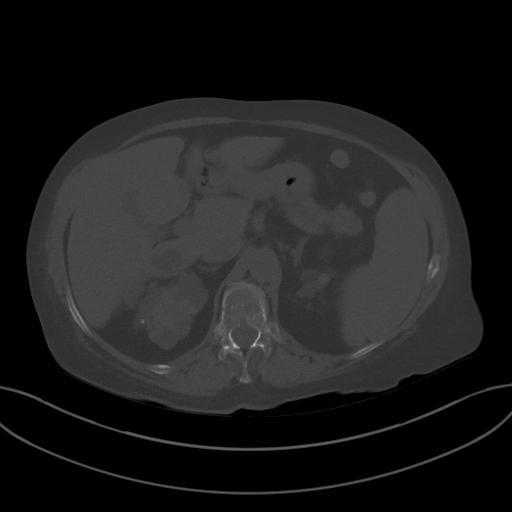
[im 74/93  soft-tissue]
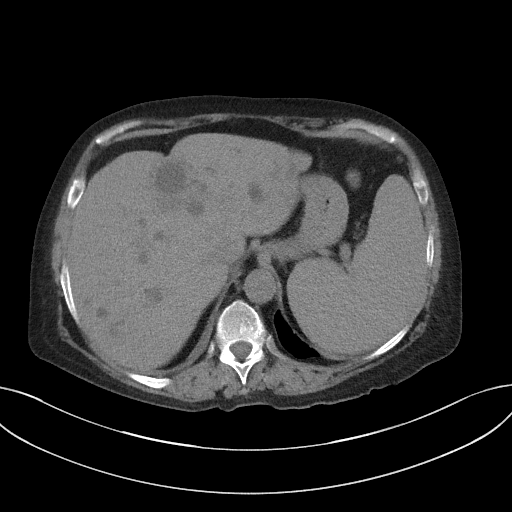
[im 81/93  soft-tissue]
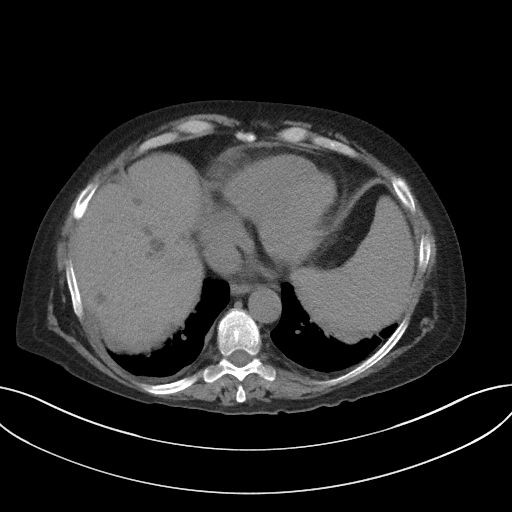
[im 89/93  soft-tissue]
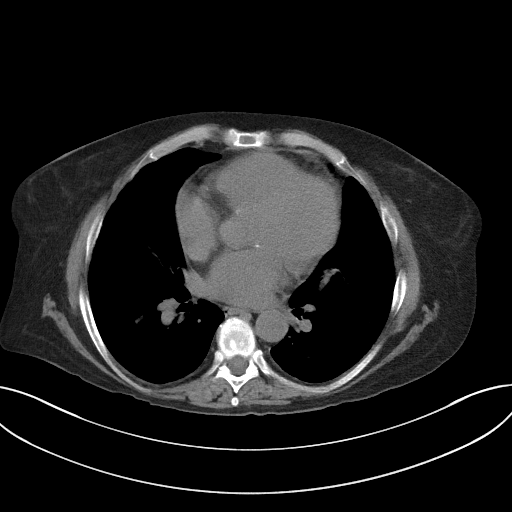

[Series 5: coronal st · coronal · 0.74mm/px · 3 of 96 slices shown]
[im 32/96  soft-tissue]
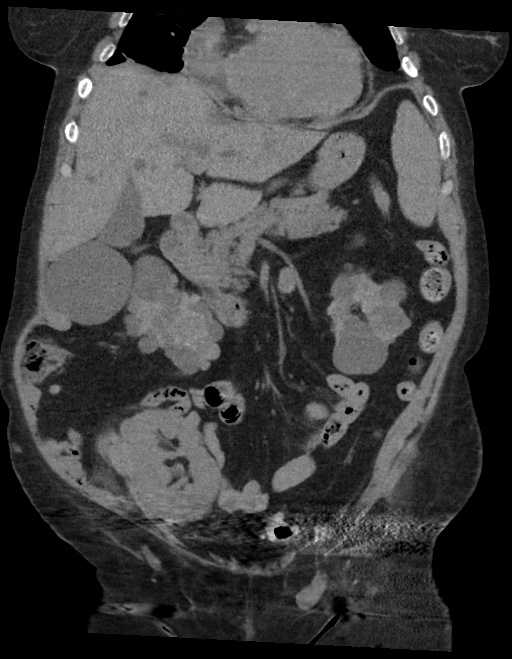
[im 43/96  soft-tissue]
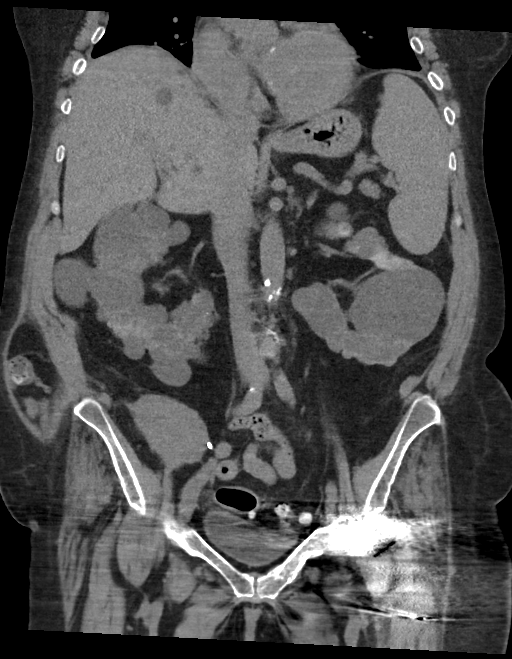
[im 53/96  soft-tissue]
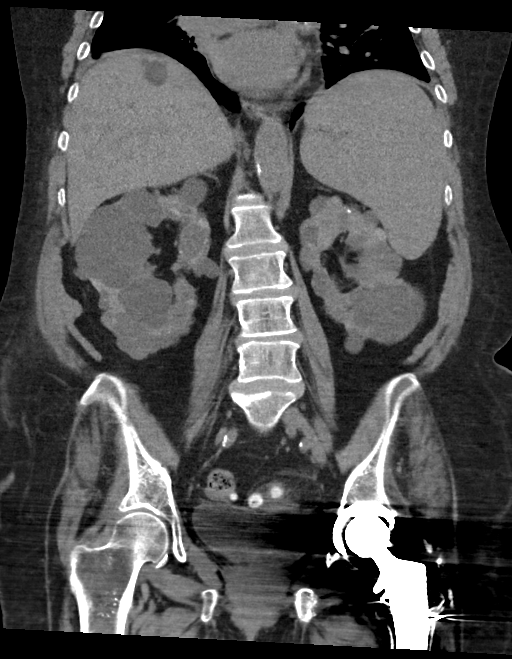

[15 of 46 positions shown; findings below may reference images not displayed]

FINDINGS: Evaluation of this exam is limited in the absence of intravenous
contrast.

Lower chest: There are bibasilar linear atelectasis/scarring. There
is trace right pleural effusion. Coronary vascular calcifications
noted.

No intra-abdominal free air or free fluid.

Hepatobiliary: Multiple liver cysts as seen on the prior CT. No
intrahepatic biliary ductal dilatation. There is sludge in the
gallbladder. No pericholecystic fluid.

Pancreas: Unremarkable. No pancreatic ductal dilatation or
surrounding inflammatory changes.

Spleen: Normal in size without focal abnormality.

Adrenals/Urinary Tract: The adrenal glands are unremarkable.
Polycystic kidneys with replaced renal parenchyma by innumerable
cysts. Scattered parenchymal calcifications noted. There is no
hydronephrosis or obstructing stone.

There is a right lower quadrant renal transplant. There is no
hydronephrosis or nephrolithiasis of the transplant kidney. No
peritransplant fluid collection. The urinary bladder is
unremarkable.

Stomach/Bowel: There is sigmoid diverticulosis. There is
inflammatory changes centered at a sigmoid diverticula consistent
with acute diverticulitis. There is no bowel obstruction. Normal
appendix.

Vascular/Lymphatic: Moderate aortoiliac atherosclerotic disease. The
IVC is unremarkable. No portal venous gas. There is no adenopathy.

Reproductive: The uterus is retroflexed. No adnexal masses.

Other: There is a small fat containing umbilical hernia. There is
mild stranding of the herniated fat, likely chronic. Correlation
with clinical exam and point tenderness recommended to exclude
strangulation/incarceration. No fluid collection.

Musculoskeletal: Degenerative changes of the spine. Old left pubic
bone fracture. Left hip arthroplasty. No acute osseous pathology.
Bilateral sacral al a insufficiency fractures similar to prior CT.
IMPRESSION: 1. Sigmoid diverticulitis.  No diverticular abscess or perforation.
2. No bowel obstruction. Normal appendix.
3. Small fat containing umbilical hernia with mild stranding of the
herniated fat, likely chronic. Correlation with clinical exam and
point tenderness recommended to exclude strangulation/incarceration.
4. Polycystic kidneys and liver.
5. Aortic Atherosclerosis ([id number]).

## 2022-03-11 IMAGING — CT CT HEAD W/O CM
3 series · 16 of 47 positions shown, 19 images · non-contrast
Comparison: Head CT 04/15/2020.

CLINICAL DATA: Delirium.

EXAM:
CT HEAD WITHOUT CONTRAST
TECHNIQUE: Contiguous axial images were obtained from the base of the skull
through the vertex without intravenous contrast.

[Series 2: head wo · axial · 0.42mm/px · z∈[+1524,+1659]mm · 10 of 33 slices shown, 13 images]
[im 3/33  brain]
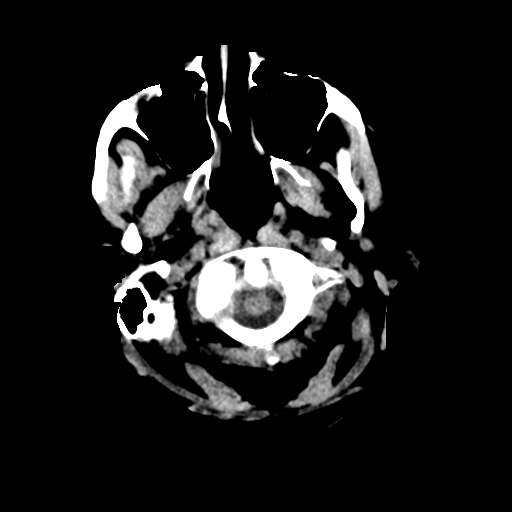
[im 3/33  bone]
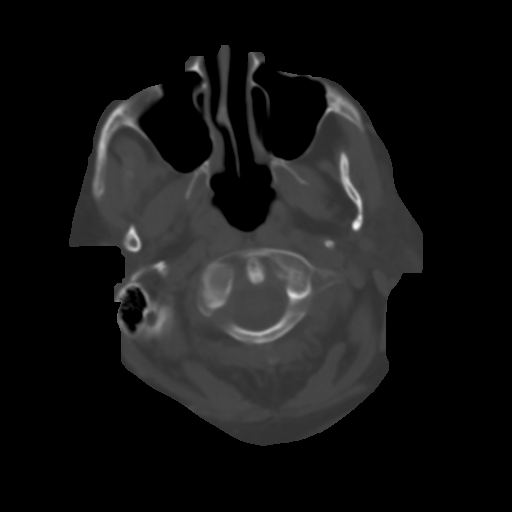
[im 6/33  brain]
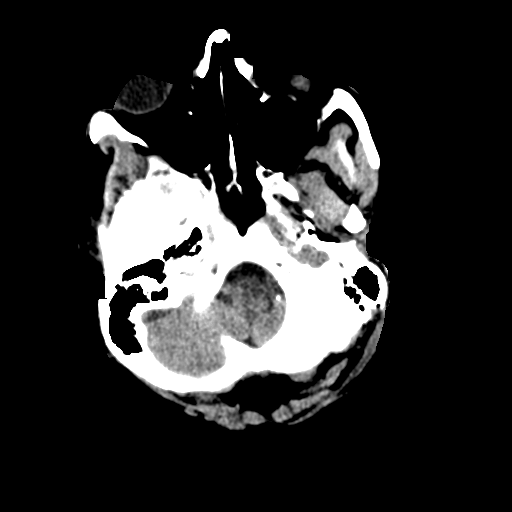
[im 9/33  brain]
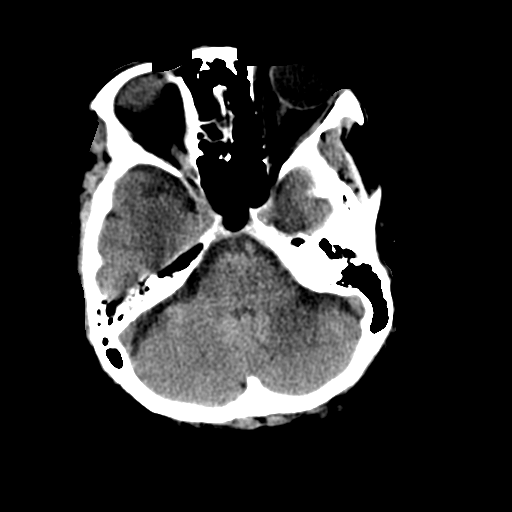
[im 12/33  brain]
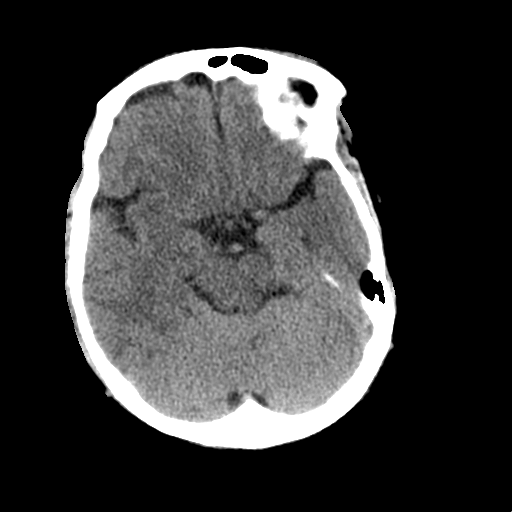
[im 15/33  brain]
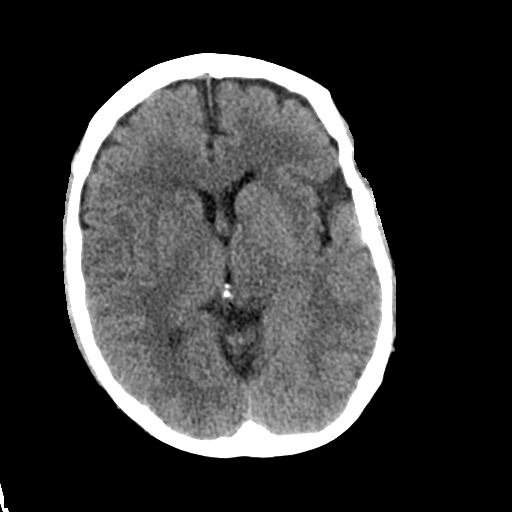
[im 15/33  bone]
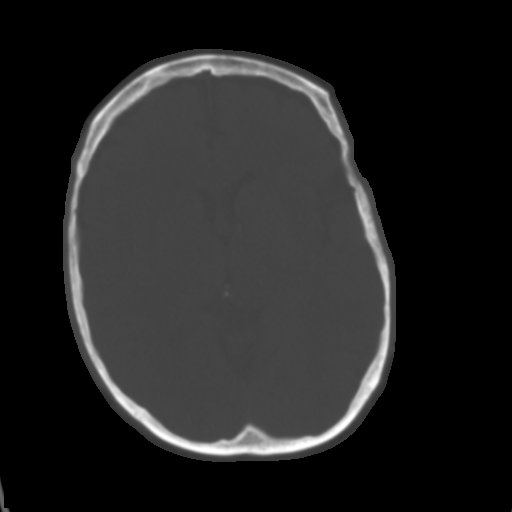
[im 18/33  brain]
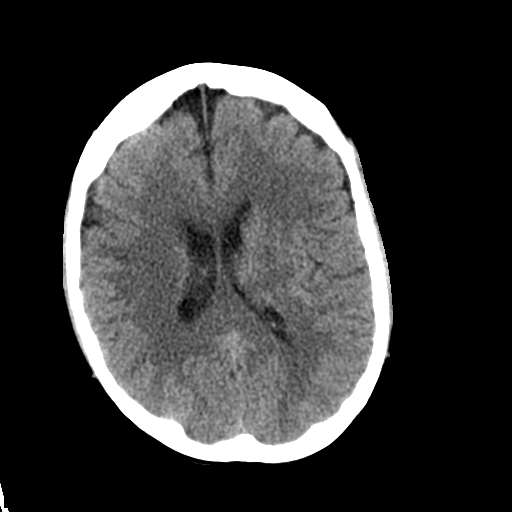
[im 21/33  brain]
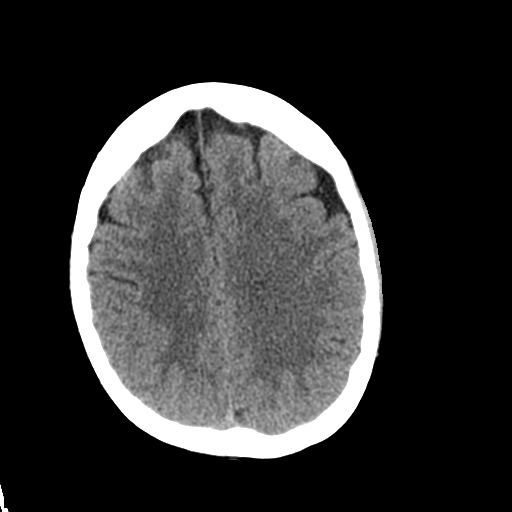
[im 25/33  brain]
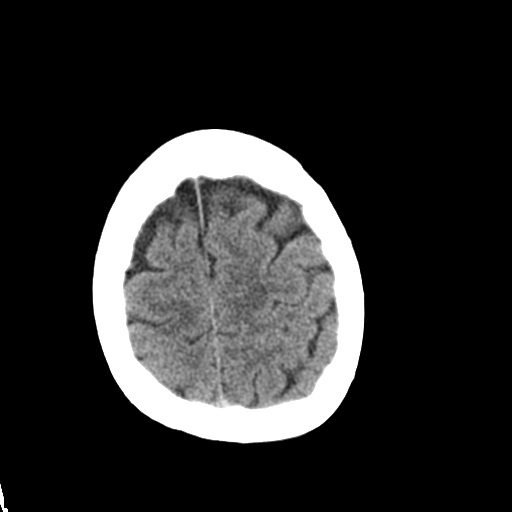
[im 27/33  brain]
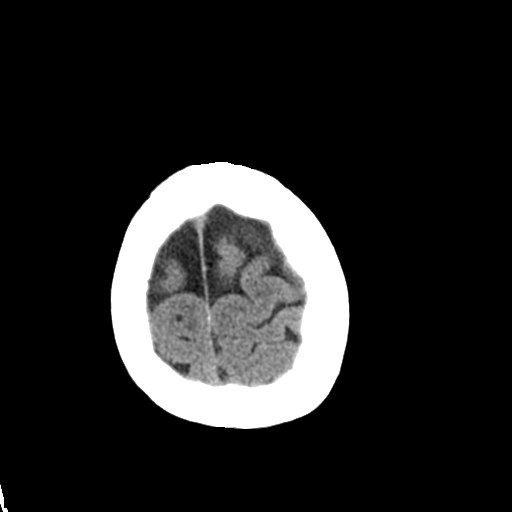
[im 27/33  bone]
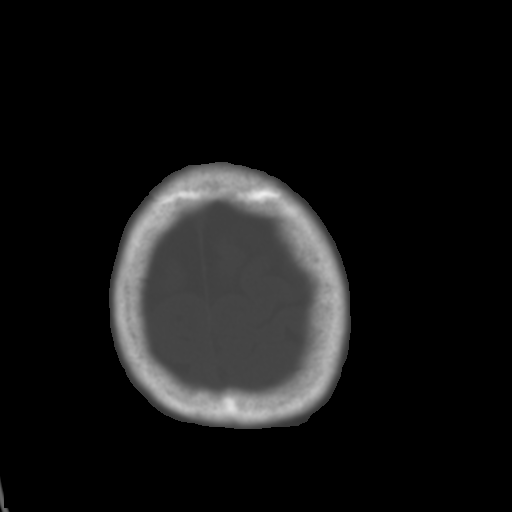
[im 30/33  brain]
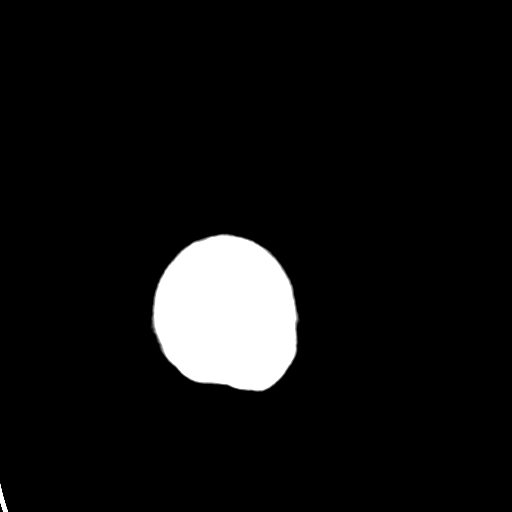

[Series 5: coronal soft tissue · coronal · 0.32mm/px · 3 of 70 slices shown]
[im 24/70  brain]
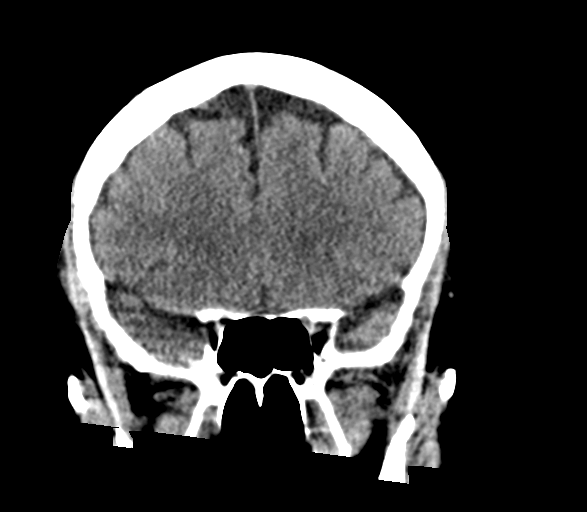
[im 31/70  brain]
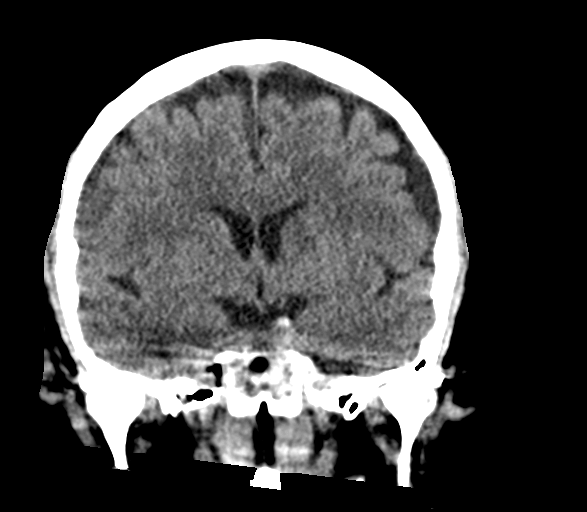
[im 39/70  brain]
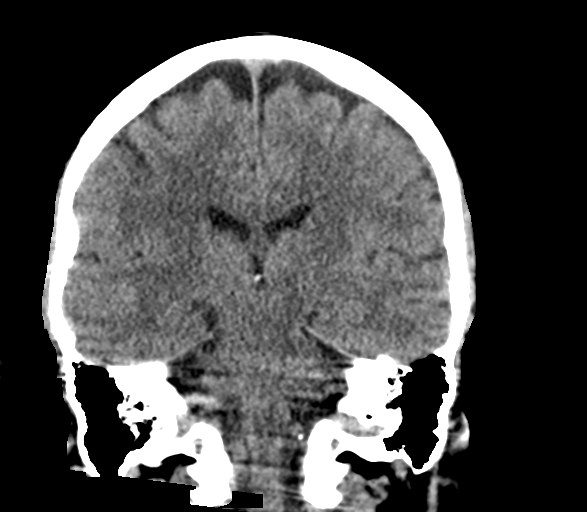

[Series 6: sagittal soft tissue · sagittal · 0.35mm/px · 3 of 59 slices shown]
[im 24/59  brain]
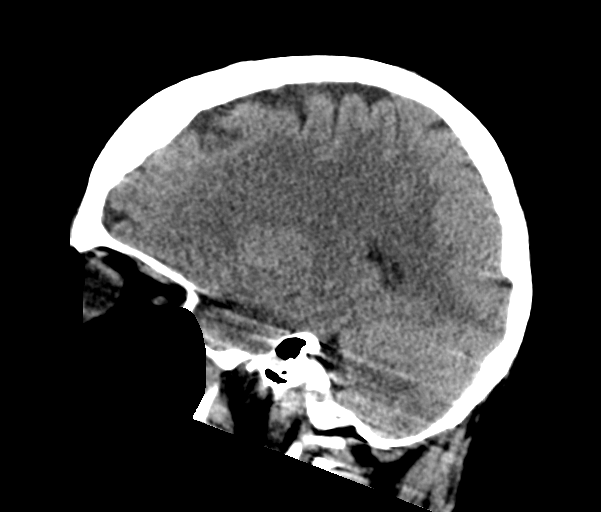
[im 31/59  brain]
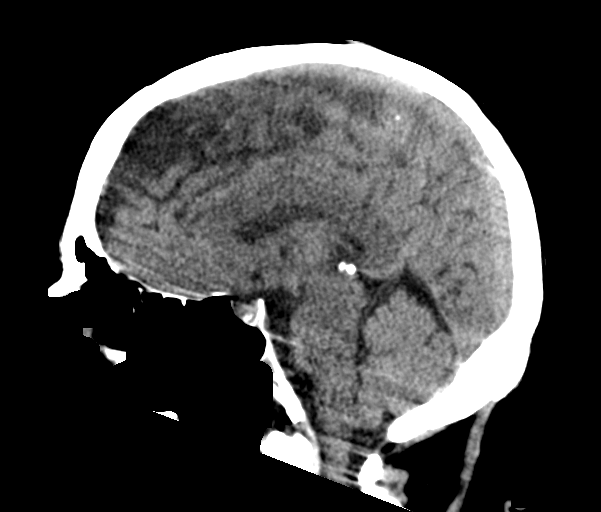
[im 39/59  brain]
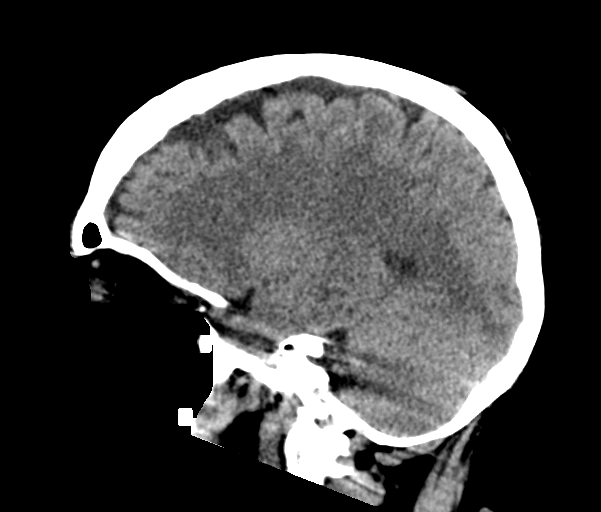

[16 of 47 positions shown; findings below may reference images not displayed]

FINDINGS: Brain:

There is no acute intracranial hemorrhage.

No demarcated cortical infarct.

No extra-axial fluid collection.

No evidence of intracranial mass.

No midline shift.

Vascular: No hyperdense vessel.

Skull: Normal. Negative for fracture or focal lesion.

Sinuses/Orbits: Visualized orbits show no acute finding. No
significant paranasal sinus disease or mastoid effusion at the
imaged levels.
IMPRESSION: No evidence of acute intracranial abnormality.

## 2022-03-11 IMAGING — CT CT ABD-PELV W/O CM
2 of 4 series · 15 of 46 positions shown, 17 images · non-contrast
Comparison: 09/02/2020 CT abdomen/pelvis.

CLINICAL DATA: Left lower quadrant abdominal pain.  Dyspnea.

EXAM:
CT ABDOMEN AND PELVIS WITHOUT CONTRAST
TECHNIQUE: Multidetector CT imaging of the abdomen and pelvis was performed
following the standard protocol without IV contrast.

[Series 3: axial st · axial · 0.98mm/px · z∈[+808,+1268]mm · 12 of 104 slices shown, 14 images]
[im 6/104  soft-tissue]
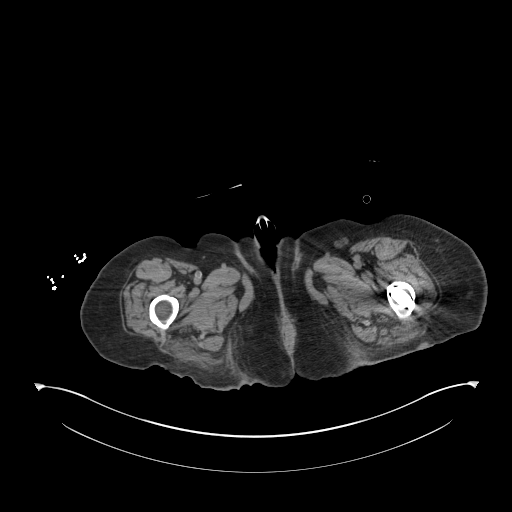
[im 6/104  bone]
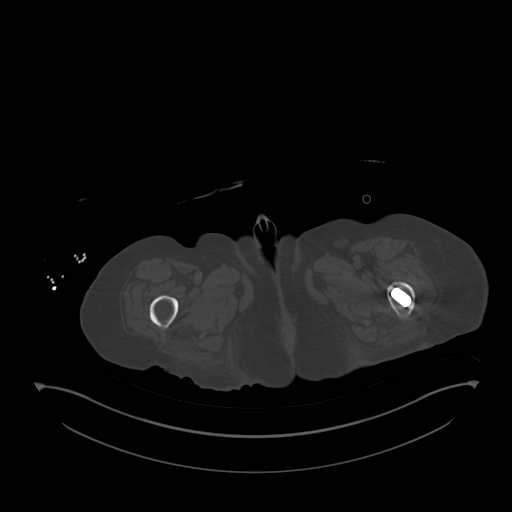
[im 17/104  soft-tissue]
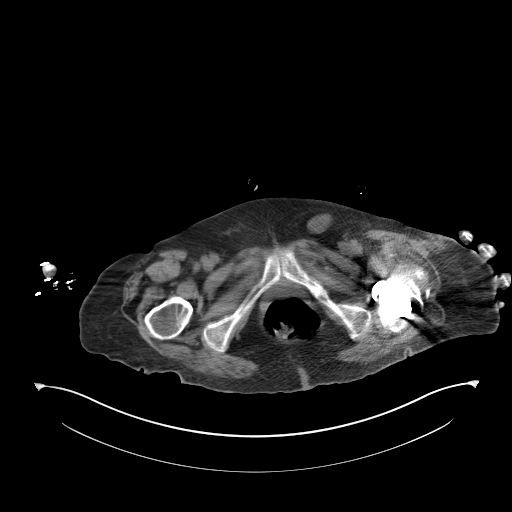
[im 22/104  soft-tissue]
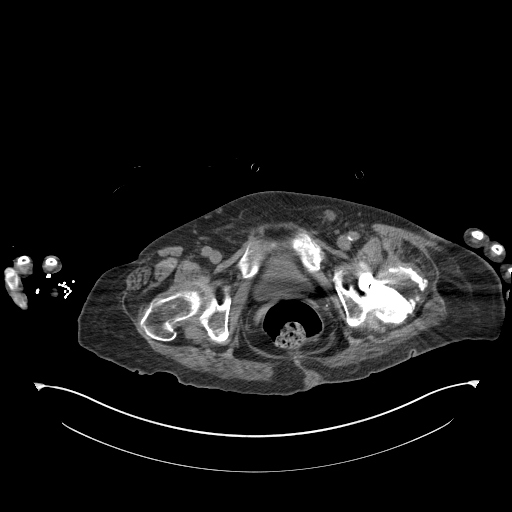
[im 33/104  soft-tissue]
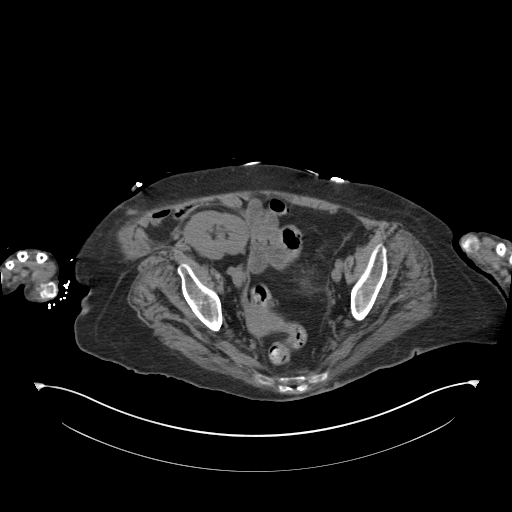
[im 38/104  soft-tissue]
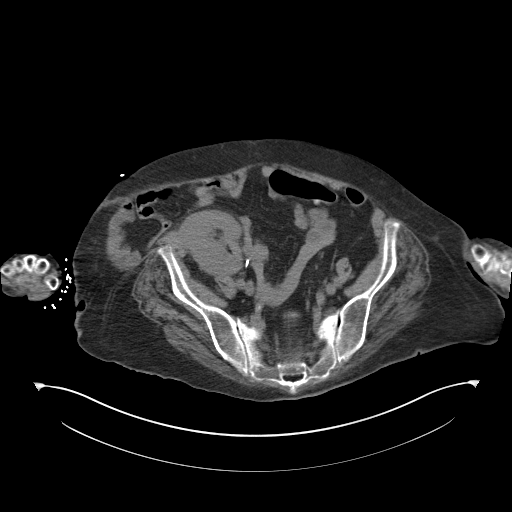
[im 49/104  soft-tissue]
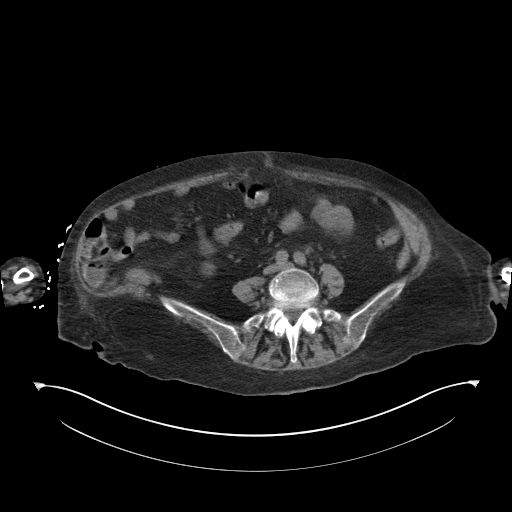
[im 55/104  soft-tissue]
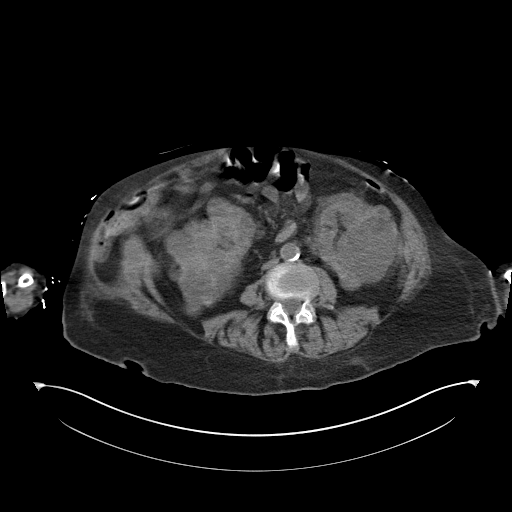
[im 66/104  soft-tissue]
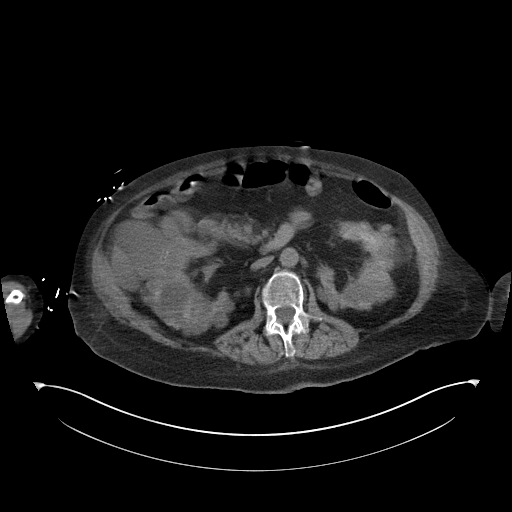
[im 71/104  soft-tissue]
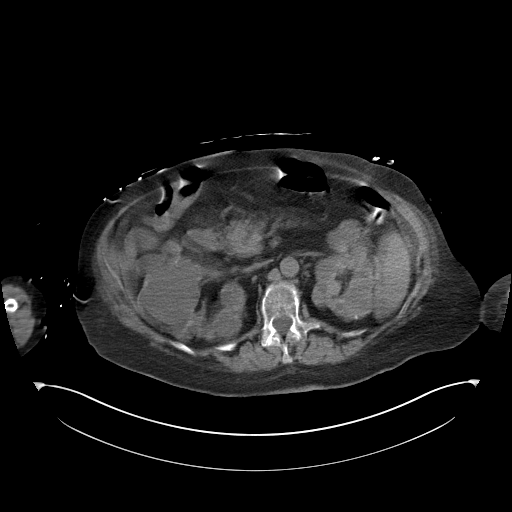
[im 71/104  bone]
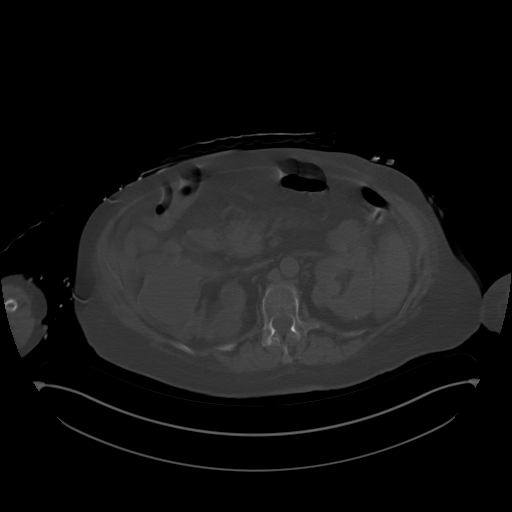
[im 82/104  soft-tissue]
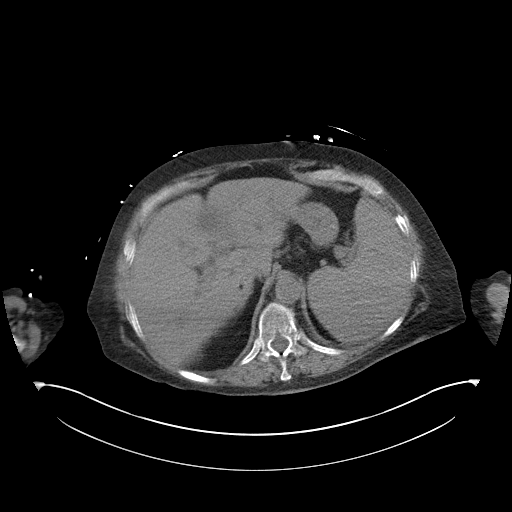
[im 87/104  soft-tissue]
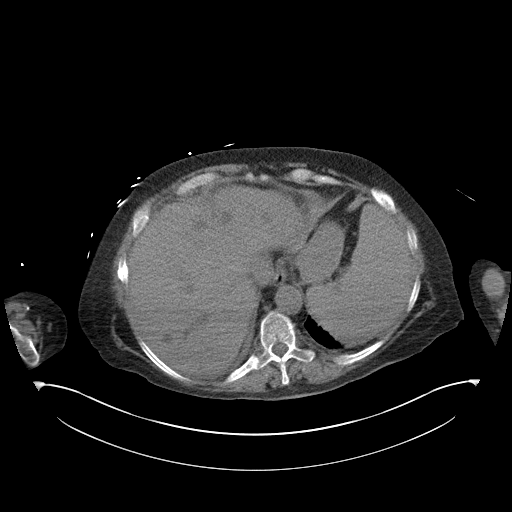
[im 98/104  soft-tissue]
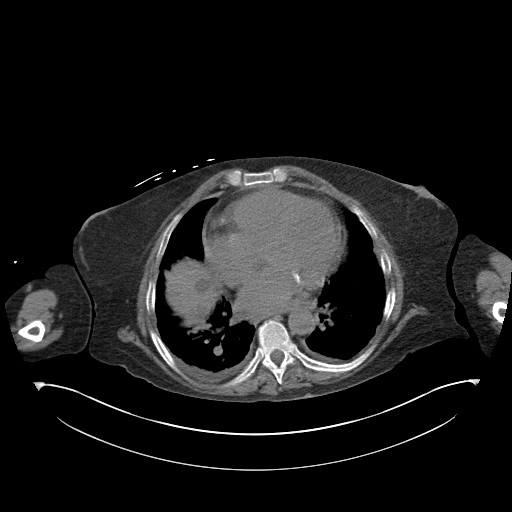

[Series 6: coronal st · coronal · 1.01mm/px · 3 of 111 slices shown]
[im 37/111  soft-tissue]
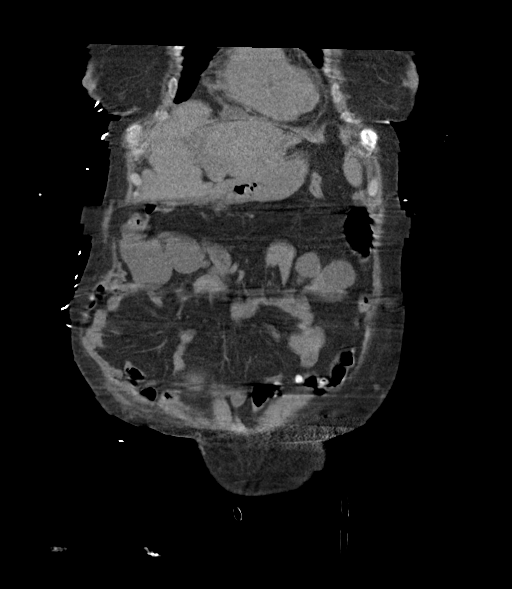
[im 49/111  soft-tissue]
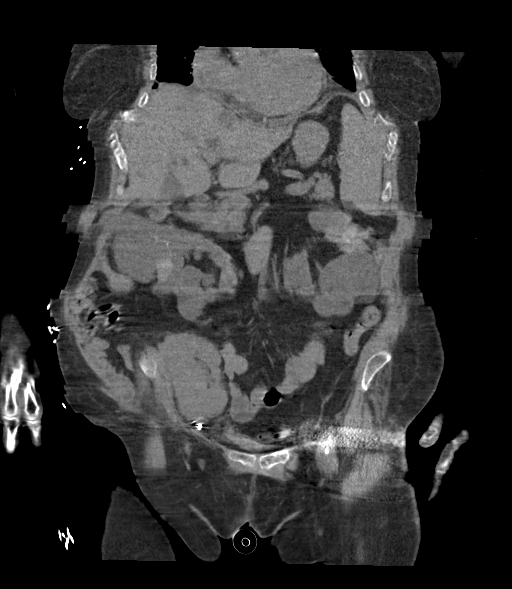
[im 62/111  soft-tissue]
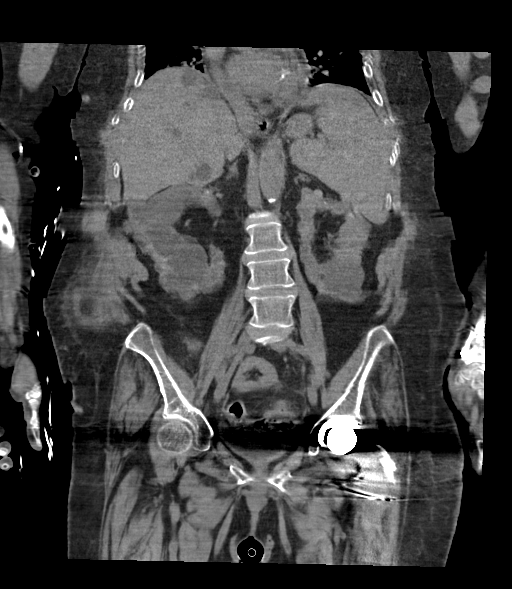

[15 of 46 positions shown; findings below may reference images not displayed]

FINDINGS: Limited motion degraded scan.

Lower chest: Trace dependent bilateral pleural effusions with
mild-to-moderate bibasilar atelectasis, increased. Cardiomegaly.
Trace pericardial effusion/thickening, unchanged.

Hepatobiliary: Normal liver size. Numerous simple cysts scattered
throughout the liver, largest 3.4 cm in the left liver lobe.
Numerous subcentimeter hypodense lesions scattered throughout the
liver are too small to characterize and are not appreciably changed.
No appreciable new liver lesions. Normal gallbladder with no
radiopaque cholelithiasis. No biliary ductal dilatation.

Pancreas: Normal, with no mass or duct dilation.

Spleen: Mild splenomegaly. Craniocaudal splenic length 13.6 cm,
stable. No splenic mass.

Adrenals/Urinary Tract: Normal adrenals. Prominently enlarged
polycystic kidneys bilaterally. No hydronephrosis. No renal stones.
Several of the renal cysts demonstrate mural calcifications in the
kidneys bilaterally, unchanged. Dominant exophytic 7.1 cm lateral
interpolar right renal cyst and dominant 7.6 cm lower left renal
cyst. No overtly suspicious renal masses on this noncontrast scan.
Right lower quadrant transplant kidney demonstrates no
hydronephrosis, no renal stones, no contour deforming renal masses
and no perinephric collections. Bladder is nondistended and is
obscured by streak artifact from left hip hardware with no gross
bladder abnormality.

Stomach/Bowel: Normal non-distended stomach. Normal caliber small
bowel with no small bowel wall thickening. Normal appendix. Moderate
sigmoid diverticulosis. Mild wall thickening with associated mild
pericolonic fat stranding in the mid sigmoid colon compatible with
acute sigmoid diverticulitis, slightly improved compared to the CT
study performed 2 days prior. No pericolonic free air or abscess.

Vascular/Lymphatic: Atherosclerotic nonaneurysmal abdominal aorta.
No pathologically enlarged lymph nodes in the abdomen or pelvis.

Reproductive: Grossly normal uterus.  No adnexal mass.

Other: No pneumoperitoneum. Small simple 2.5 x 1.8 cm cystic focus
associated with an apparent small left inguinal hernia, unchanged
(series 3/image 87). Small to moderate fat containing right
periumbilical hernia is unchanged.

Musculoskeletal: No aggressive appearing focal osseous lesions. Left
total hip arthroplasty. Healed deformities in the left pubic rami.
Mild thoracolumbar spondylosis.
IMPRESSION: 1. Acute sigmoid diverticulitis, slightly improved compared to the
CT study performed 2 days prior. No free air or abscess.
2. Trace dependent bilateral pleural effusions with mild-to-moderate
bibasilar atelectasis, increased.
3. Cardiomegaly. Trace pericardial effusion/thickening, unchanged.
4. Prominently enlarged polycystic kidneys bilaterally, unchanged.
No hydronephrosis. Right lower quadrant transplant kidney is
unremarkable.
5. Stable mild splenomegaly.
6. Stable small simple 2.5 cm cystic focus associated with an
apparent small left inguinal hernia.
7. Stable small to moderate fat containing right periumbilical
hernia.
8. Aortic Atherosclerosis ([id number]).

## 2022-03-12 IMAGING — DX DG CHEST 1V PORT
1 series · 1 of 1 positions shown · non-contrast
Comparison: Chest radiograph from one day prior.

CLINICAL DATA: Dyspnea, COPD, CHF

EXAM:
PORTABLE CHEST 1 VIEW

[chest ap]
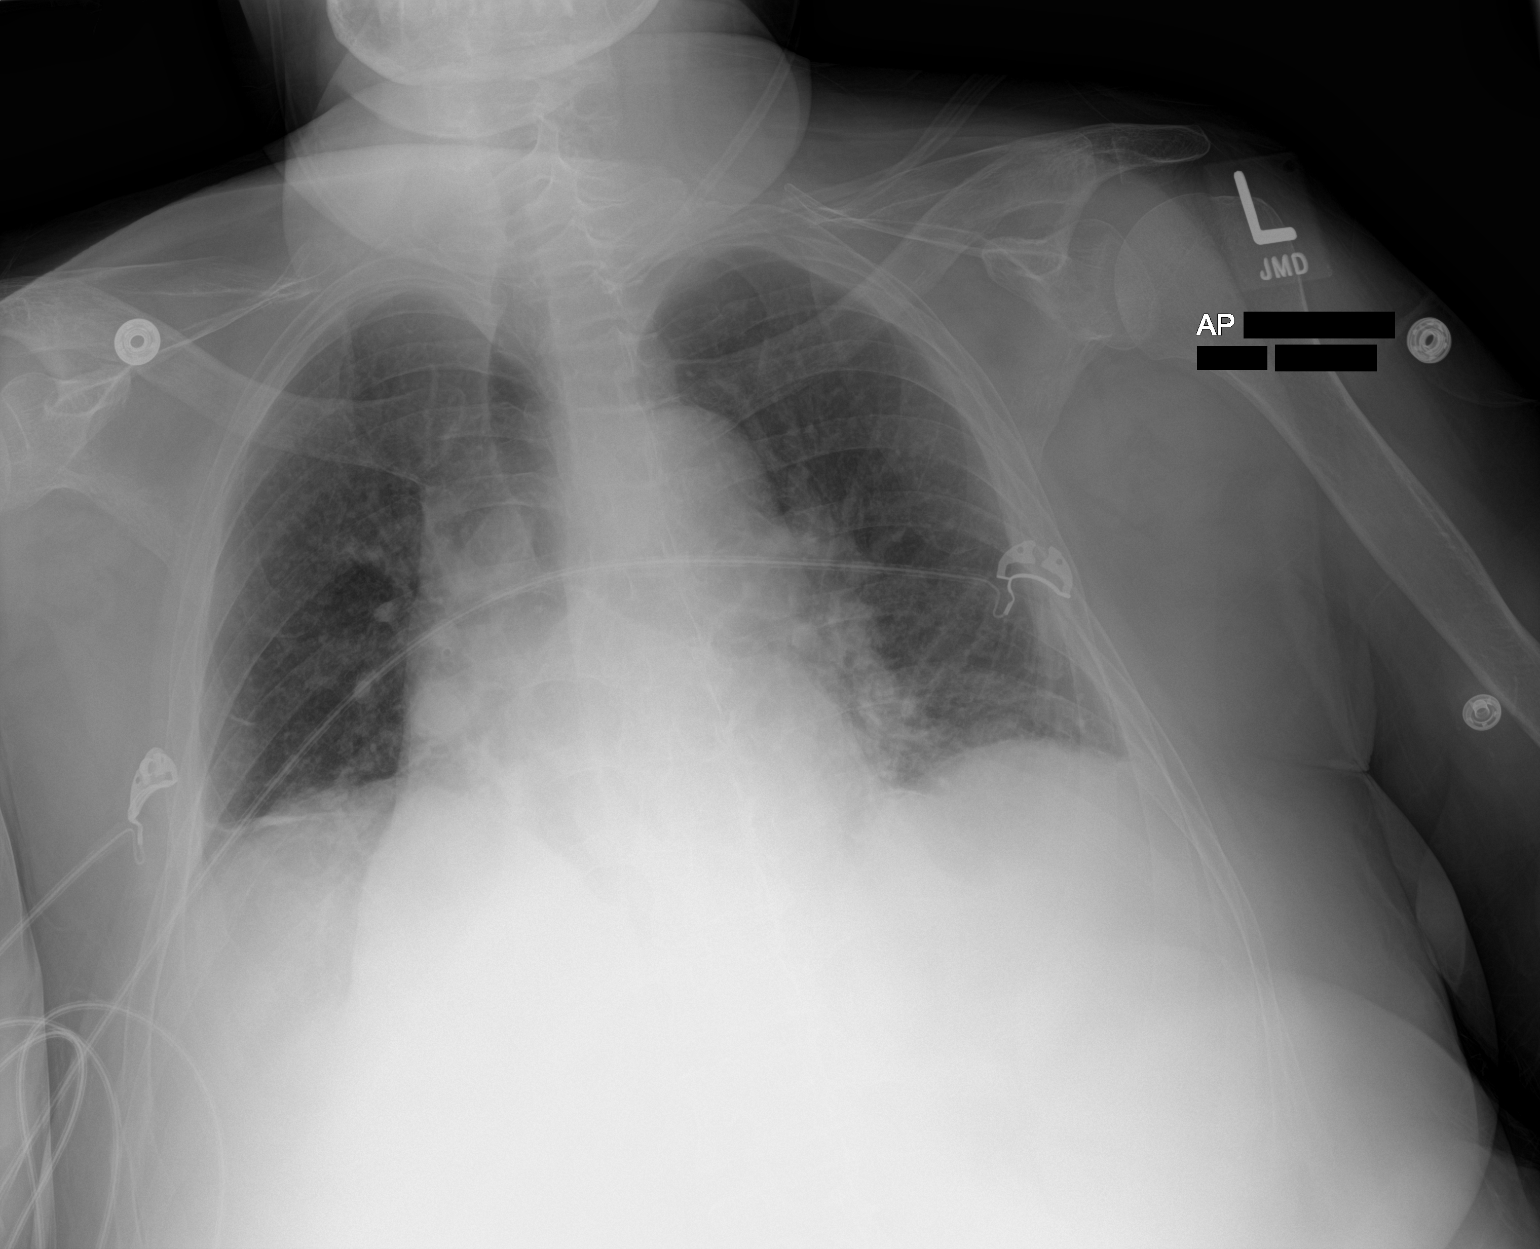

[1 of 1 positions shown; findings below may reference images not displayed]

FINDINGS: Stable cardiomediastinal silhouette with mild cardiomegaly. No
pneumothorax. No pleural effusion. No overt pulmonary edema. Mild
bibasilar scarring versus atelectasis, similar.
IMPRESSION: 1. Stable mild cardiomegaly without overt pulmonary edema.
2. Stable mild bibasilar scarring versus atelectasis.

## 2022-03-15 IMAGING — US US RENAL TRANSPLANT
1 series · 13 of 25 positions shown · non-contrast
Comparison: None.

CLINICAL DATA: 64-year-old female with acute renal insufficiency.
Renal transplant in 6006.

EXAM:
ULTRASOUND OF RENAL TRANSPLANT WITH RENAL DOPPLER ULTRASOUND
TECHNIQUE: Ultrasound examination of the renal transplant was performed with
gray-scale, color and duplex doppler evaluation.

[Series 1: us renal transplant · 13 of 32 slices shown]
[im 1/32]
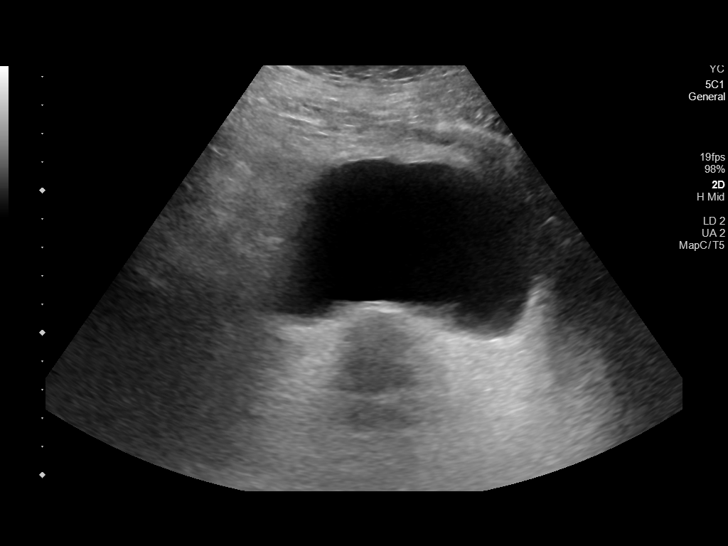
[im 3/32]
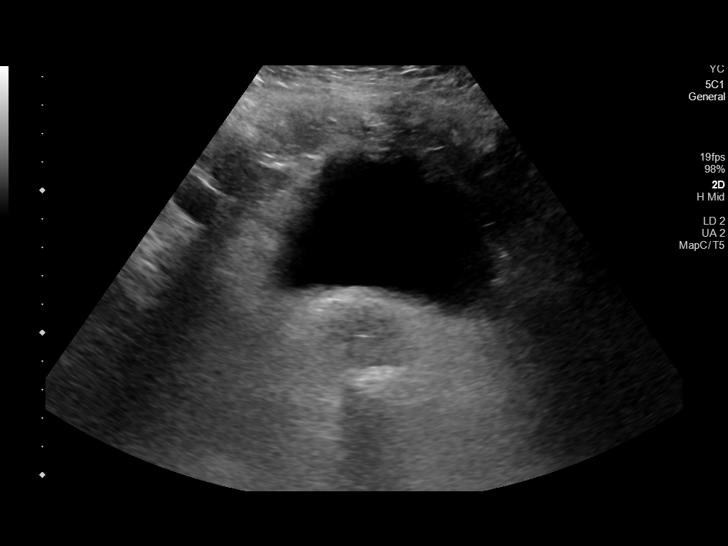
[im 6/32]
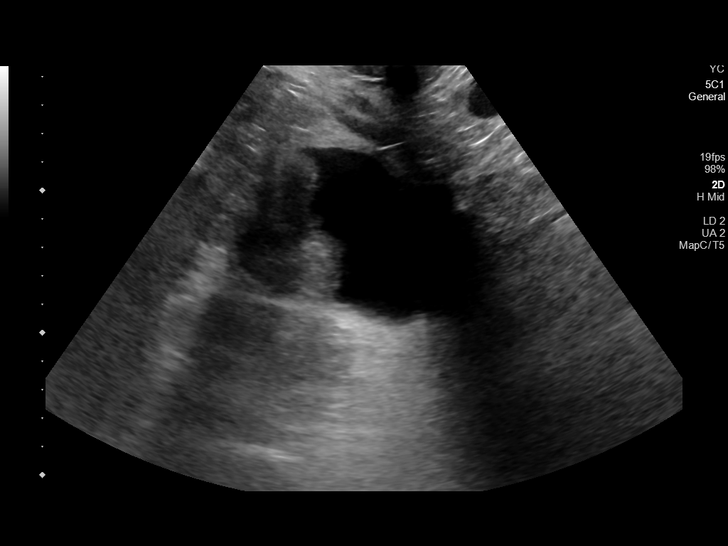
[im 8/32]
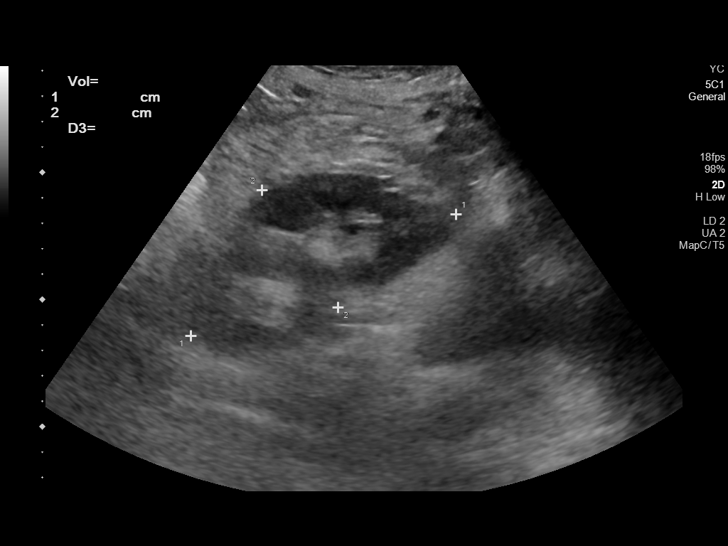
[im 11/32]
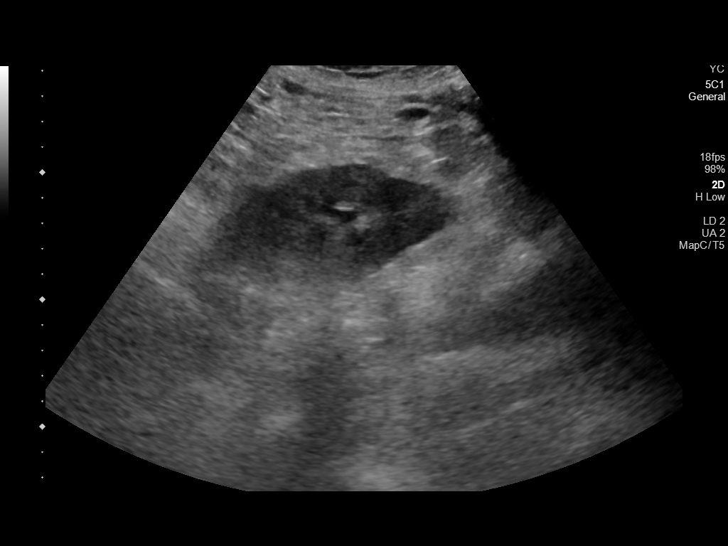
[im 13/32]
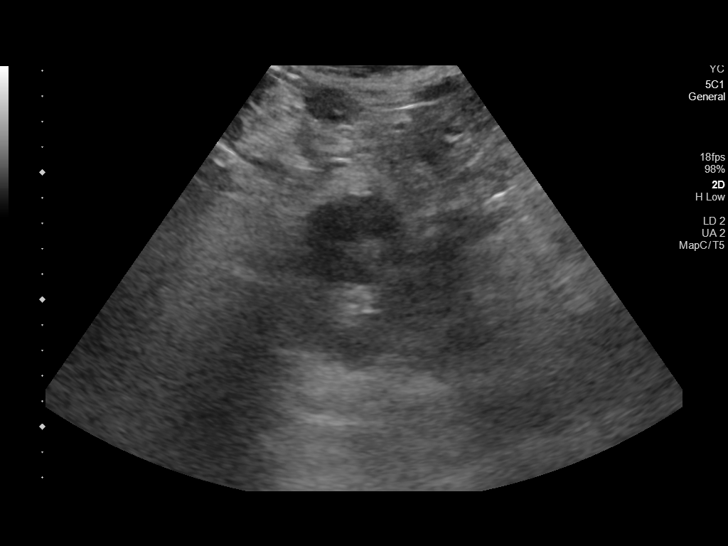
[im 16/32]
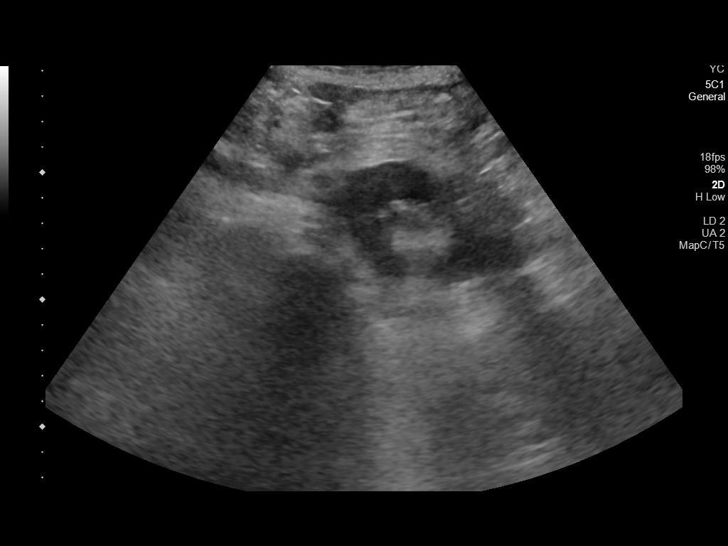
[im 19/32]
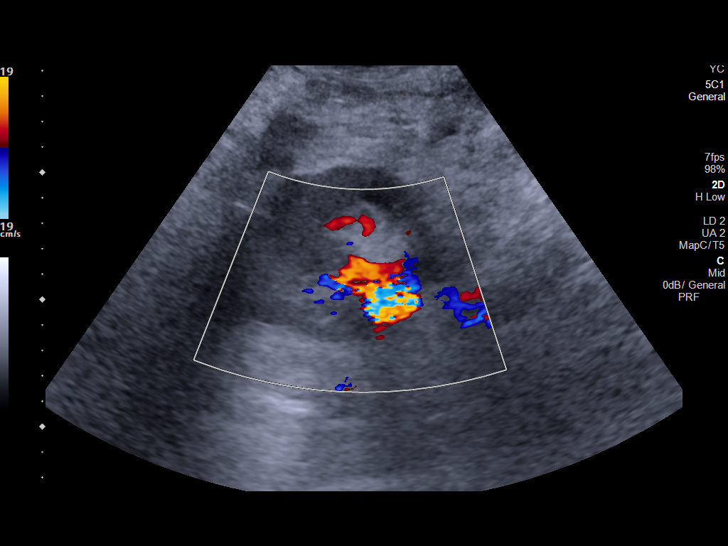
[im 21/32]
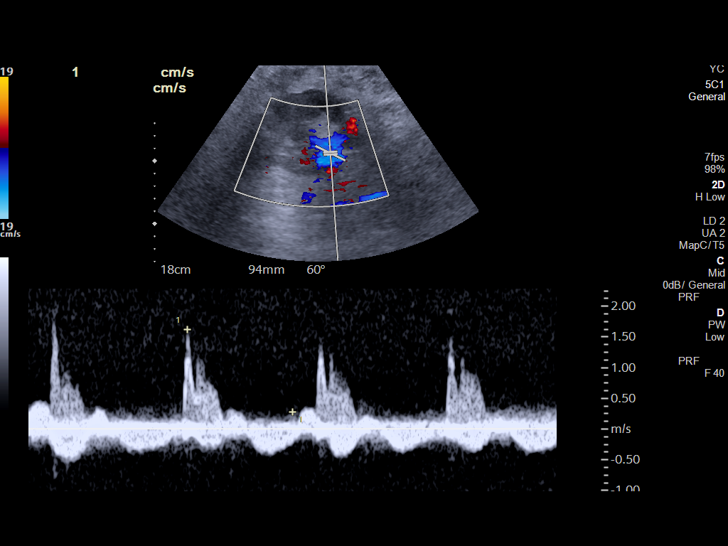
[im 24/32]
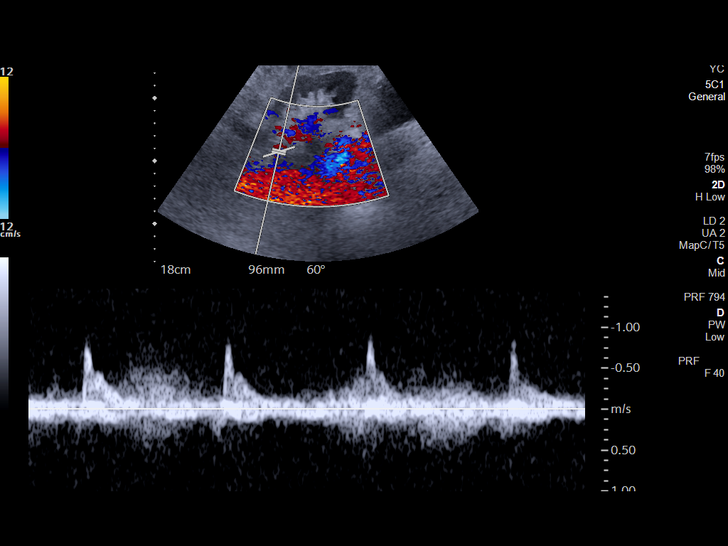
[im 26/32]
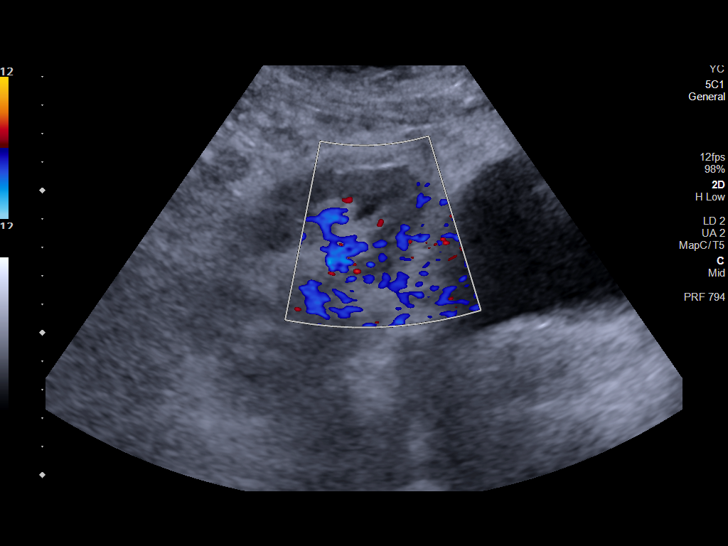
[im 29/32]
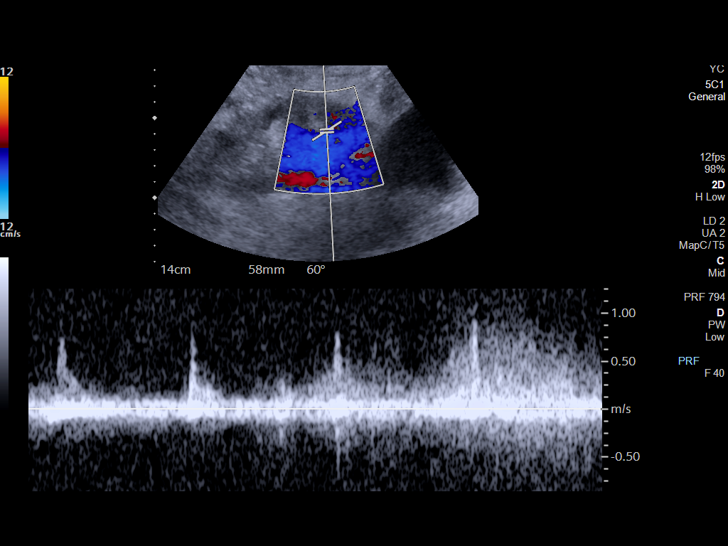
[im 32/32]
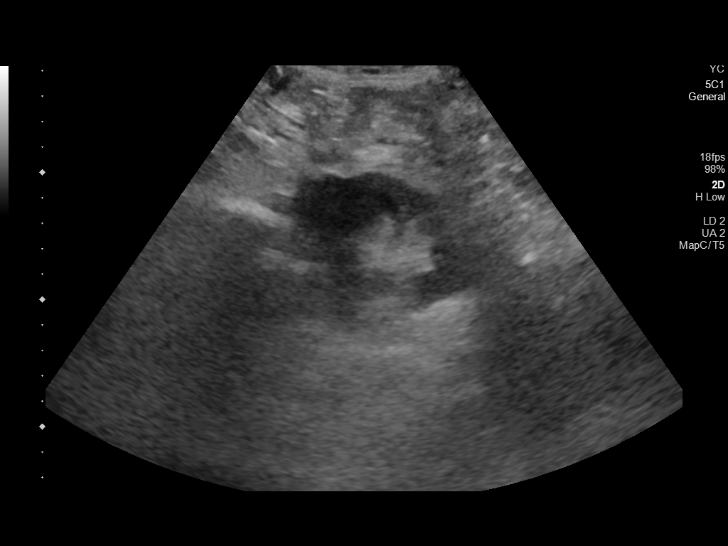

[13 of 25 positions shown; findings below may reference images not displayed]

FINDINGS: Transplant kidney location: Right lower quadrant

Transplant Kidney:

Renal measurements: 11.5 x 5.5 x 7.0 cm = volume: 230mL. Normal in
size and parenchymal echogenicity. No evidence of mass or
hydronephrosis. No peri-transplant fluid collection seen.

Color flow in the main renal artery:  Yes

Color flow in the main renal vein:  Yes

Duplex Doppler Evaluation:

Main Renal Artery Velocity: 162 cm/sec

Main Renal Artery Resistive Index:

Venous waveform in main renal vein:  Present

Intrarenal resistive index in upper pole:

(normal 0.6-0.8; equivocal 0.8-0.9; abnormal >= 0.9)

Intrarenal resistive index in lower pole:

(normal 0.6-0.8; equivocal 0.8-0.9; abnormal >= 0.9)

Bladder: Normal for degree of bladder distention.

Other findings:  None.
IMPRESSION: 1. No hydronephrosis or nephrolithiasis of the renal transplant. No
peritransplant fluid collection.
2. Renal transplant resistive indices within upper limits of normal
range.

## 2022-04-29 IMAGING — DX DG ABDOMEN 1V
1 series · 1 of 1 positions shown · non-contrast
Comparison: 04/19/2019

CLINICAL DATA: Weakness and fevers

EXAM:
ABDOMEN - 1 VIEW

[abdomen kub]
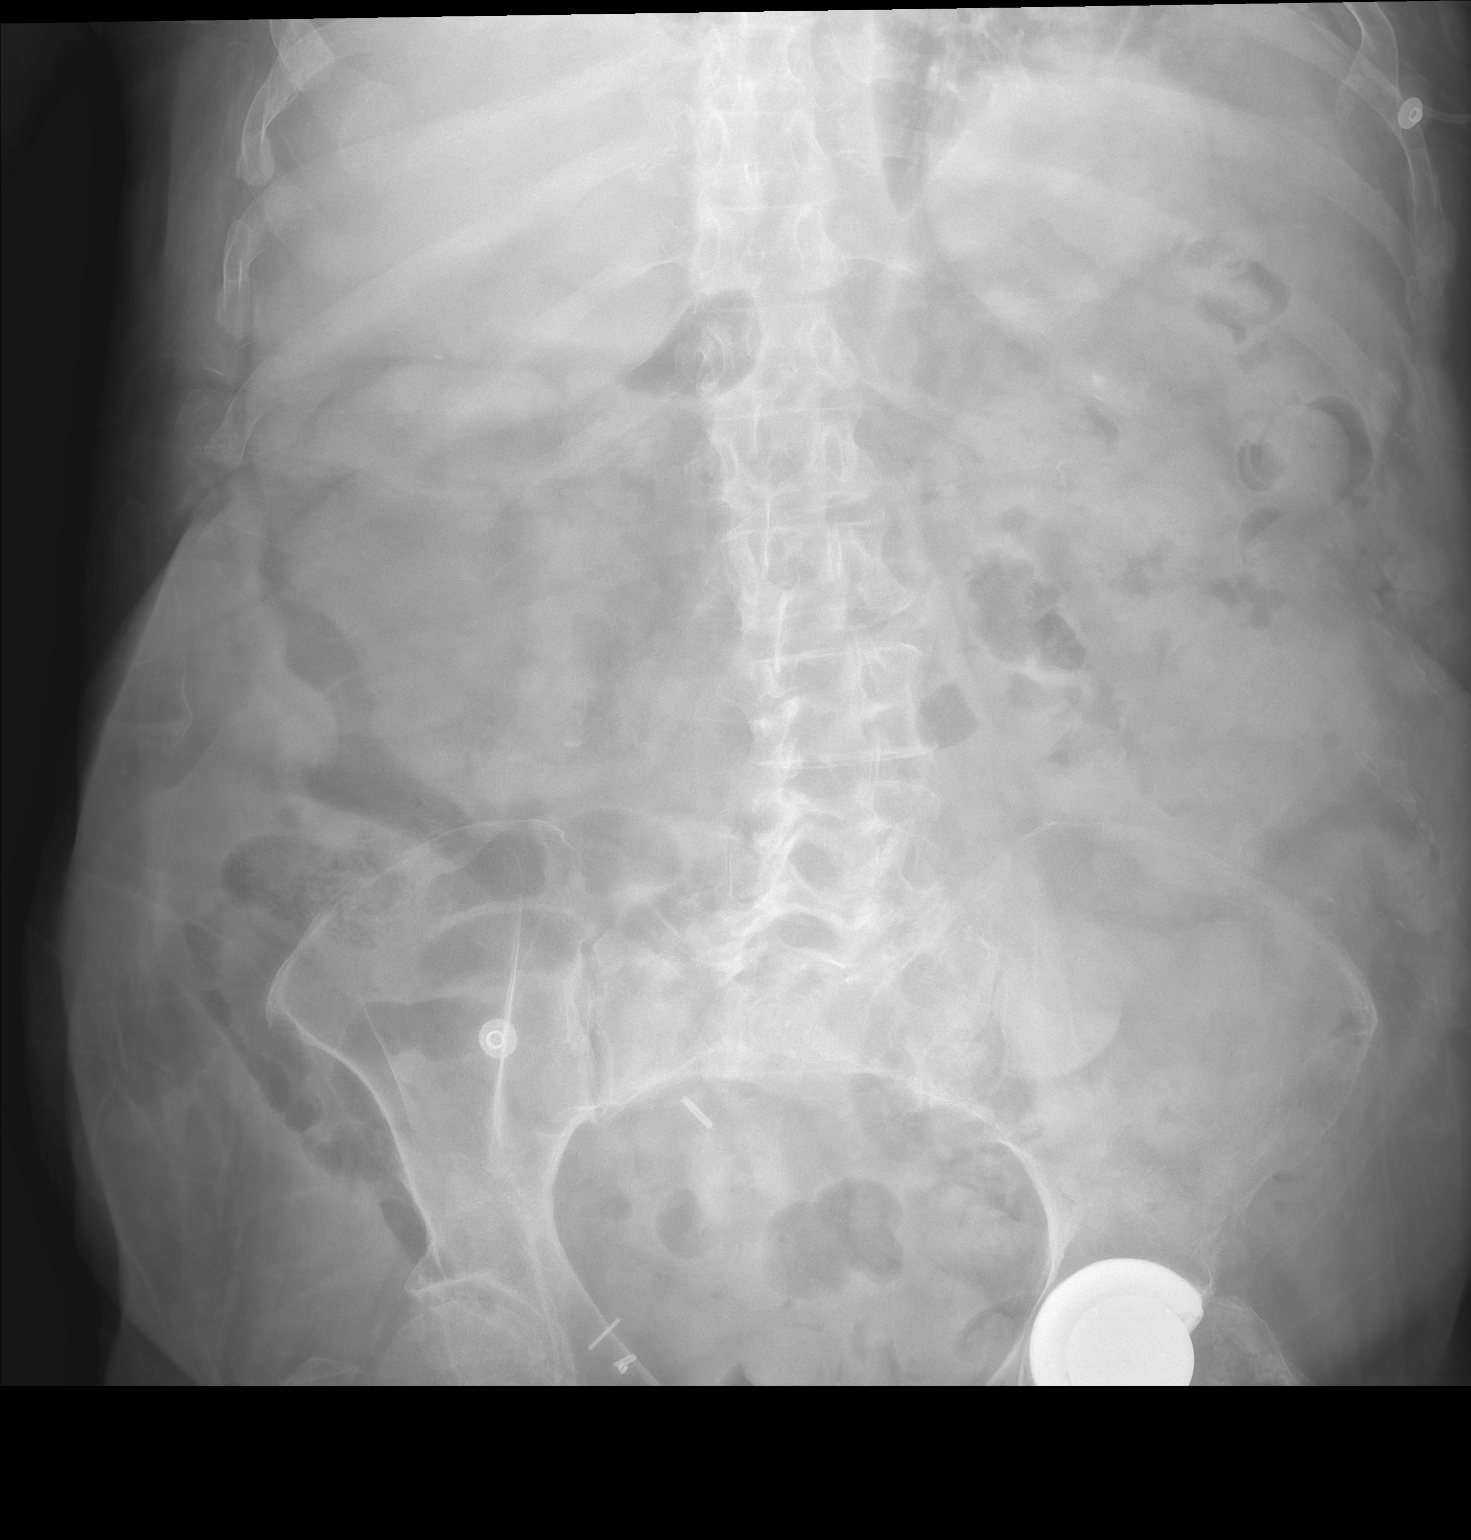

[1 of 1 positions shown; findings below may reference images not displayed]

FINDINGS: Scattered large and small bowel gas is noted. No abnormal mass or
abnormal calcifications are seen. Left hip replacement is now noted.
Mild degenerative changes of lumbar spine are seen.
IMPRESSION: No acute abnormality noted.

## 2022-04-30 IMAGING — CT CT ABD-PELV W/O CM
2 of 4 series · 16 of 46 positions shown, 18 images · non-contrast
Comparison: 09/04/2020

CLINICAL DATA: Abdominal pain.

EXAM:
CT ABDOMEN AND PELVIS WITHOUT CONTRAST
TECHNIQUE: Multidetector CT imaging of the abdomen and pelvis was performed
following the standard protocol without IV contrast.

[Series 2: axial st · axial · 0.87mm/px · z∈[-486,-36]mm · 13 of 104 slices shown, 15 images]
[im 7/104  soft-tissue]
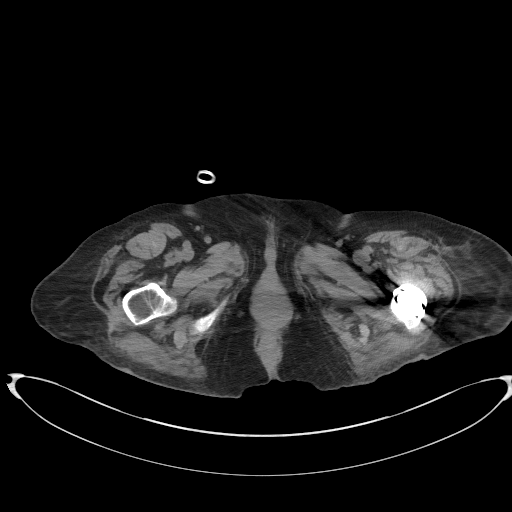
[im 7/104  bone]
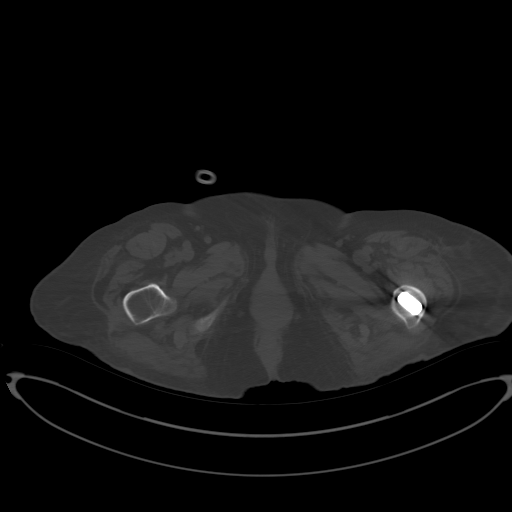
[im 13/104  soft-tissue]
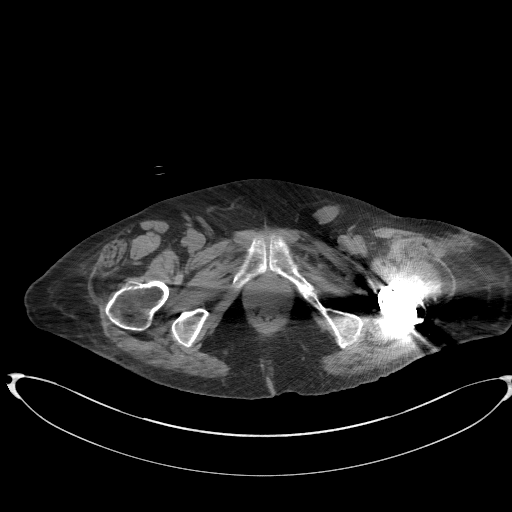
[im 25/104  soft-tissue]
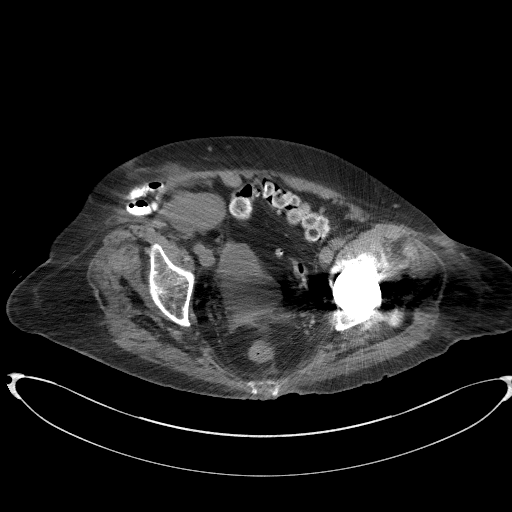
[im 31/104  soft-tissue]
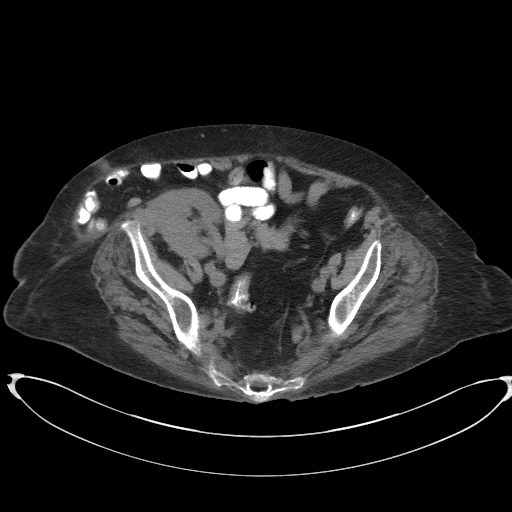
[im 37/104  soft-tissue]
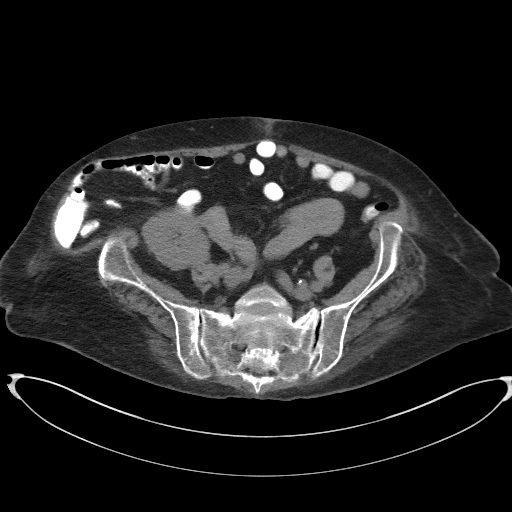
[im 43/104  soft-tissue]
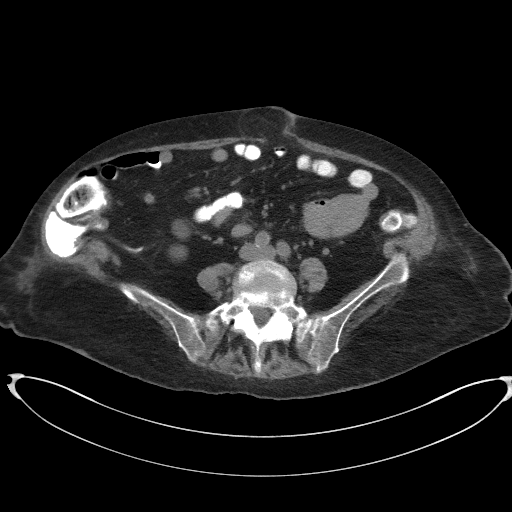
[im 55/104  soft-tissue]
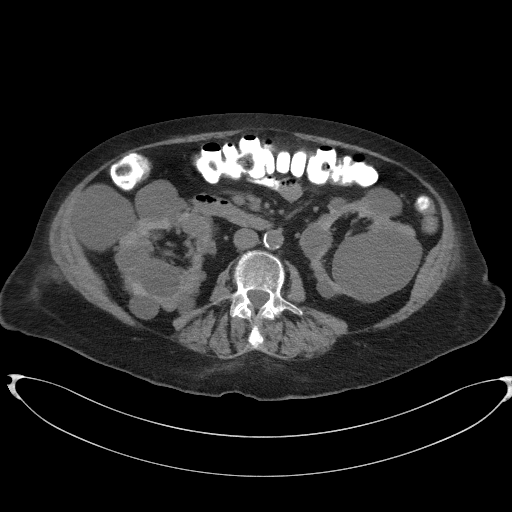
[im 61/104  soft-tissue]
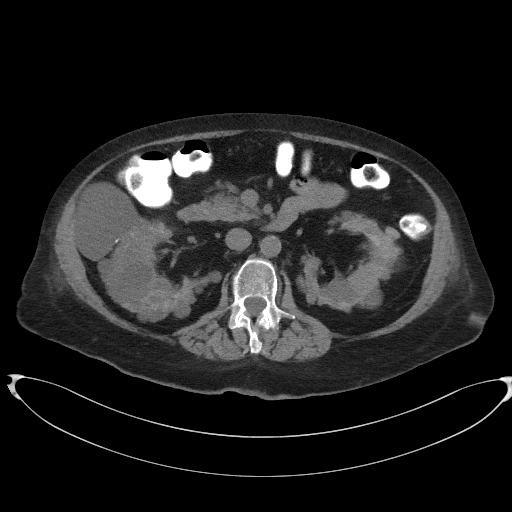
[im 67/104  soft-tissue]
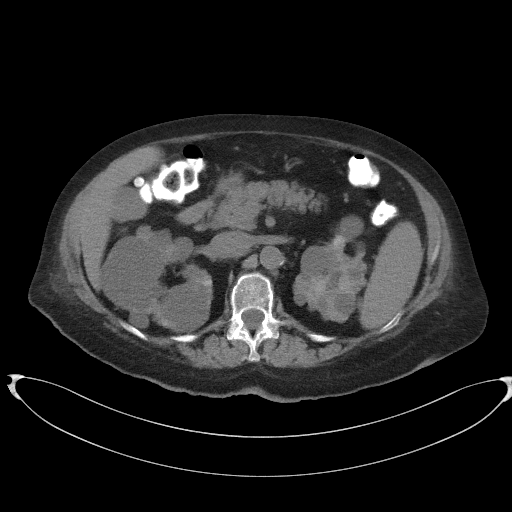
[im 67/104  bone]
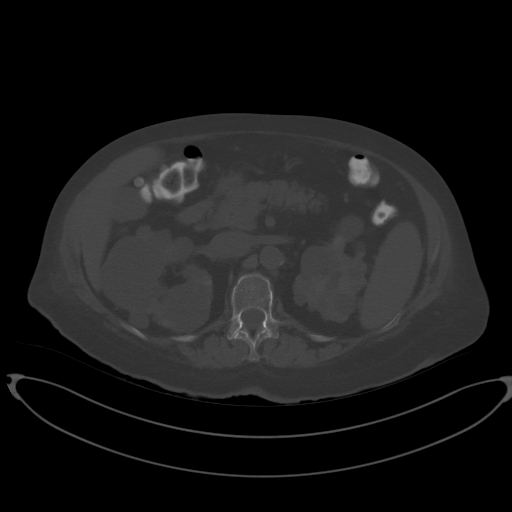
[im 73/104  soft-tissue]
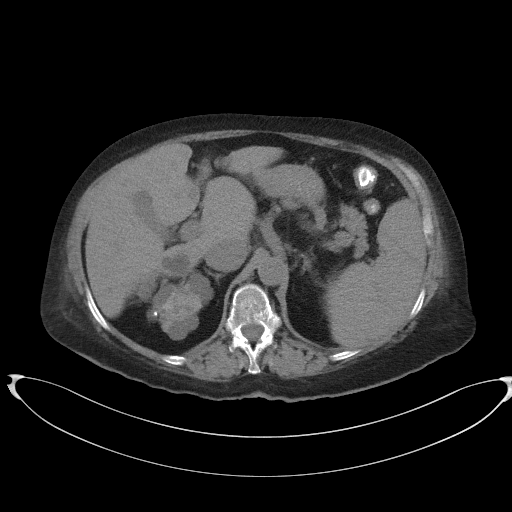
[im 79/104  soft-tissue]
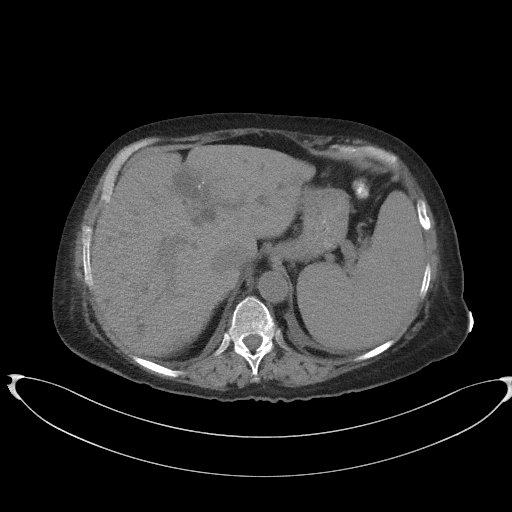
[im 91/104  soft-tissue]
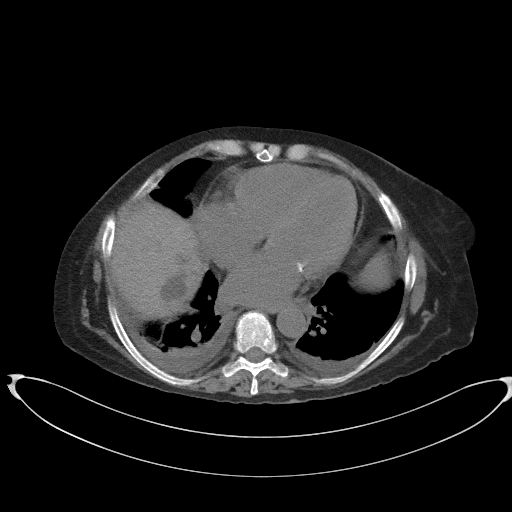
[im 97/104  soft-tissue]
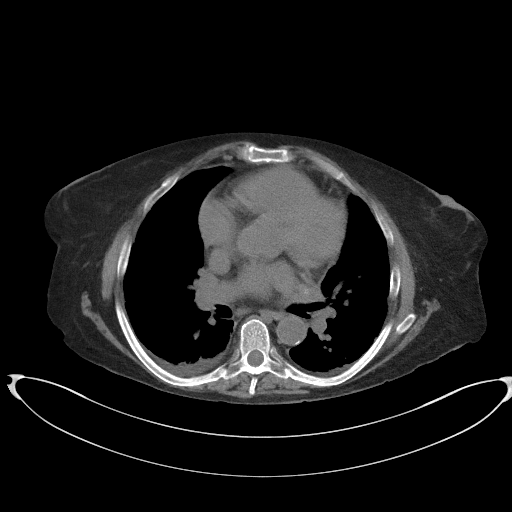

[Series 4: coronal st · coronal · 1.16mm/px · 3 of 83 slices shown]
[im 28/83  soft-tissue]
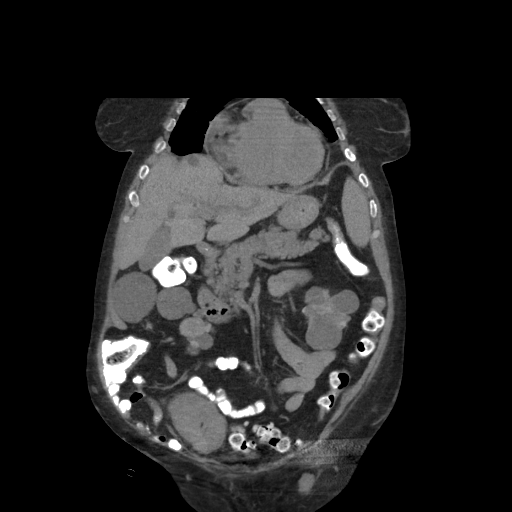
[im 37/83  soft-tissue]
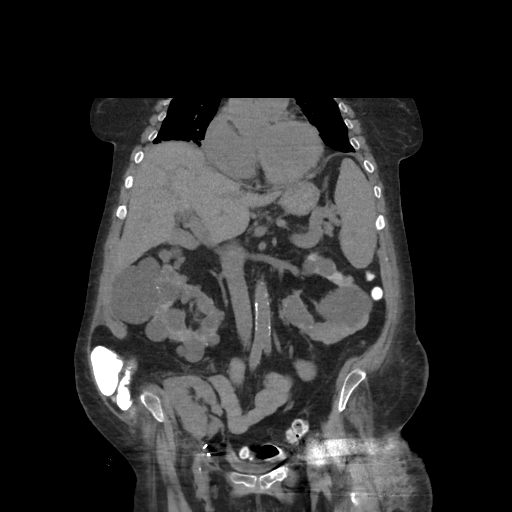
[im 46/83  soft-tissue]
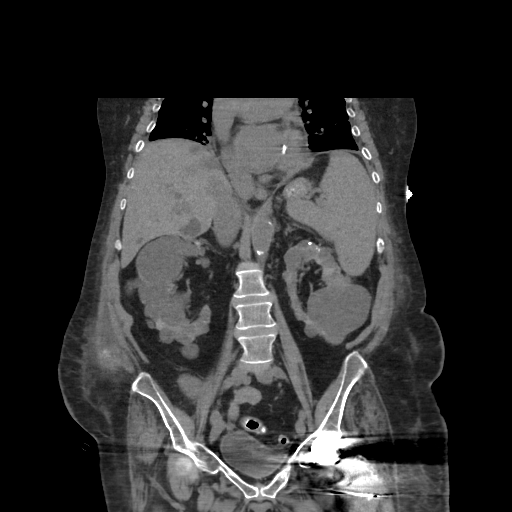

[16 of 46 positions shown; findings below may reference images not displayed]

FINDINGS: Lower chest: Mild cardiomegaly. Small effusions. Lung base opacities
likely atelectasis. Findings similar to the prior study.

Hepatobiliary: Liver normal in size and overall attenuation. There
are multiple circumscribed low-density masses consistent with
numerous cysts. An area less well-defined hypoattenuation lies in
the left lobe adjacent to the falciform ligament, similar to the
prior study, which may reflect focal fat infiltration. Subtle small
density suggested in the dependent lower gallbladder segment which
may reflect a stone. Gallbladder otherwise unremarkable. No bile
duct dilation.

Pancreas: Unremarkable. No pancreatic ductal dilatation or
surrounding inflammatory changes.

Spleen: Enlarged spleen, 14 cm in greatest dimension, unchanged. No
splenic mass.

Adrenals/Urinary Tract: No adrenal masses.

Numerous bilateral renal masses, the majority of which are
hypoattenuating, with areas of thin intervening renal parenchyma.
Overall diffuse parenchymal thinning. Findings are stable from the
prior exam consist polycystic kidney disease. No convincing
collecting system stones. No hydronephrosis. Normal ureters. Bladder
mildly distended and partly obscured by left hip arthroplasty
artifact. Bladder otherwise unremarkable.

Right pelvic renal transplant kidney is normal in size. No mass or
hydronephrosis and no change.

Stomach/Bowel: Stomach is within normal limits. Appendix appears
normal. No evidence of bowel wall thickening, distention, or
inflammatory changes.

Vascular/Lymphatic: Aortic atherosclerosis. No enlarged lymph nodes.

Reproductive: Unremarkable.

Other: Small fat containing paraumbilical hernia. Oval area of soft
tissue attenuation along the left inguinal canal, which may reflect
localized fluid, but is nonspecific. It is unchanged from the prior
CT.

Musculoskeletal: Old right rib fractures. No acute fractures. No
osteoblastic or osteolytic lesions.
IMPRESSION: 1. No acute findings within the abdomen or pelvis.
2. Lung bases demonstrate mild cardiomegaly, small effusions and
lung base opacities consistent with atelectasis, without significant
change from the prior CT.
3. Findings of polycystic kidney disease including multiple liver
cysts, also unchanged.
4. Mild stable splenomegaly.
5. Possible gallstone.  No acute cholecystitis.
6. Normal appearance of the right pelvic renal transplant.
7. Aortic atherosclerosis. Small fat containing paraumbilical
hernia.

## 2022-05-01 IMAGING — DX DG CHEST 1V PORT
1 series · 1 of 1 positions shown · non-contrast
Comparison: Chest x-ray 10/23/2020.  Chest CT 05/14/2020.

CLINICAL DATA: Shortness of breath.

EXAM:
PORTABLE CHEST 1 VIEW

[chest ap]
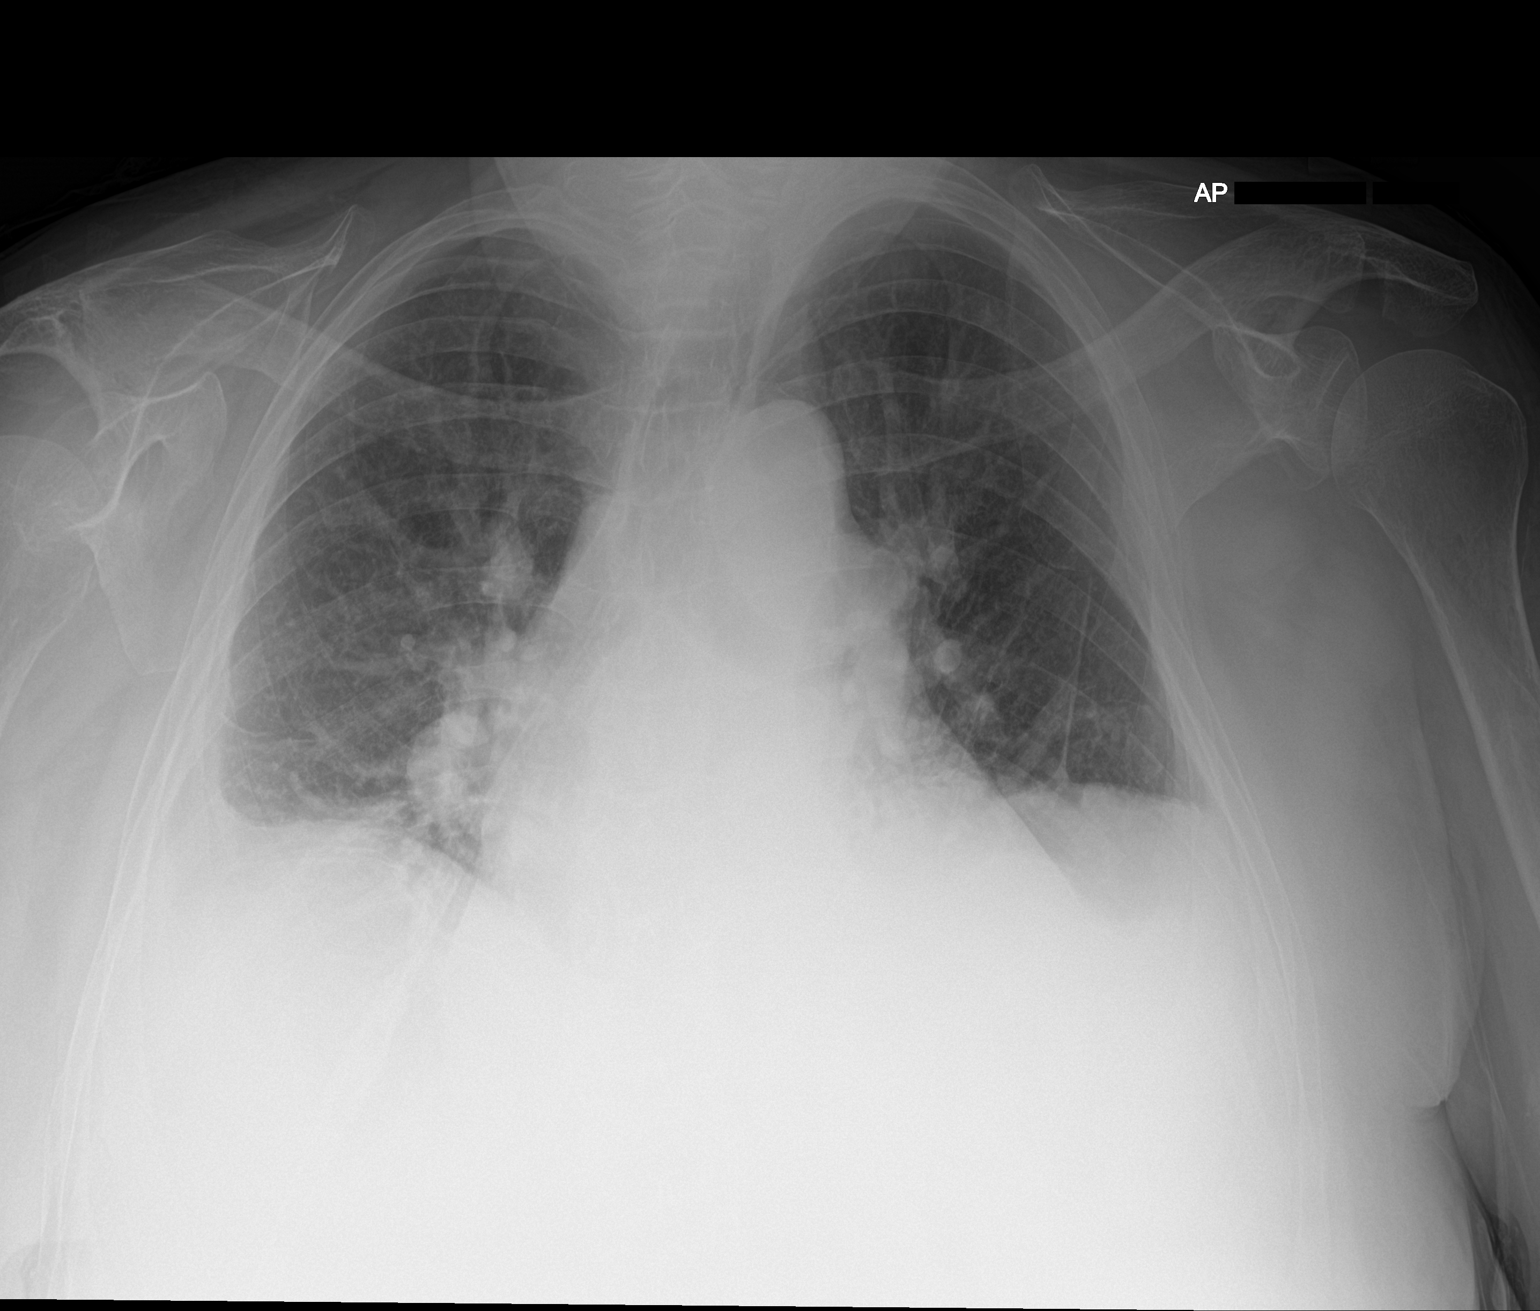

[1 of 1 positions shown; findings below may reference images not displayed]

FINDINGS: Mediastinum and hilar structures are stable. Mild cardiomegaly and
pulmonary venous congestion. Progressive bibasilar atelectasis. Mild
left base infiltrate cannot be excluded. Small bilateral pleural
effusions noted. Stable bibasilar pleural-parenchymal thickening
consistent scarring. No pneumothorax.
IMPRESSION: 1. Mild cardiomegaly and pulmonary venous congestion.
2. Progressive bibasilar atelectasis. Mild left base infiltrate
cannot be excluded. Small bilateral pleural effusions noted.

## 2022-05-01 IMAGING — US US RENAL
1 series · 14 of 25 positions shown · non-contrast
Comparison: October 25, 2019

CLINICAL DATA: Acute kidney injury

EXAM:
RENAL / URINARY TRACT ULTRASOUND COMPLETE

[Series 1: us renal · 14 of 61 slices shown]
[im 1/61]
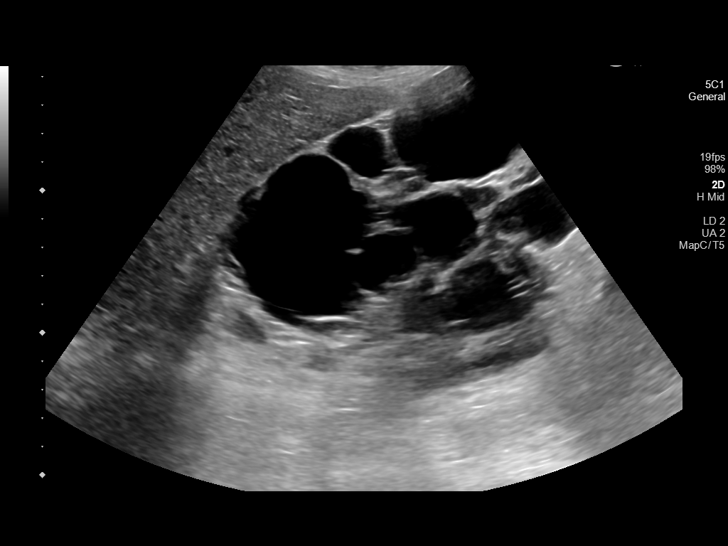
[im 6/61]
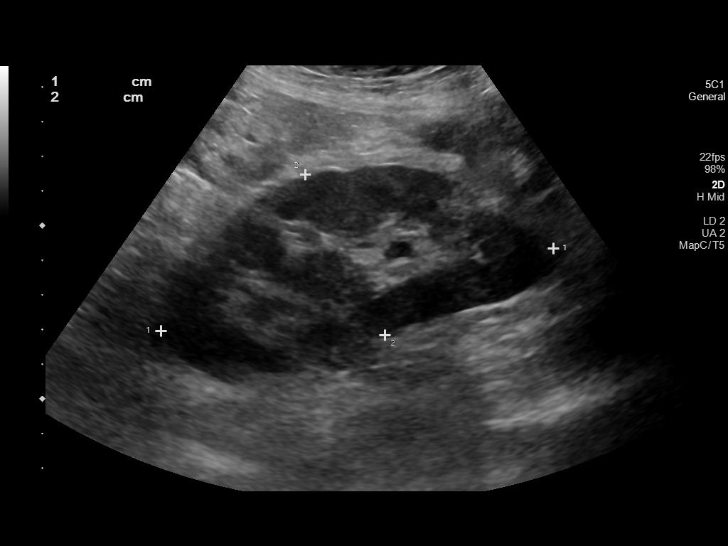
[im 11/61]
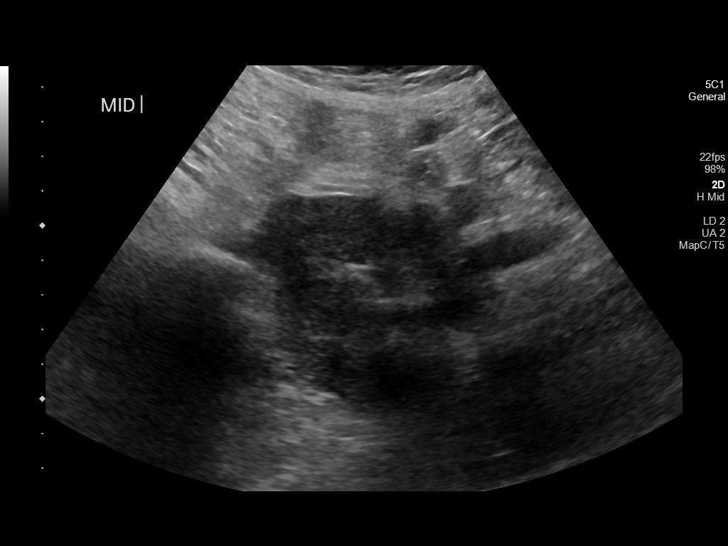
[im 16/61]
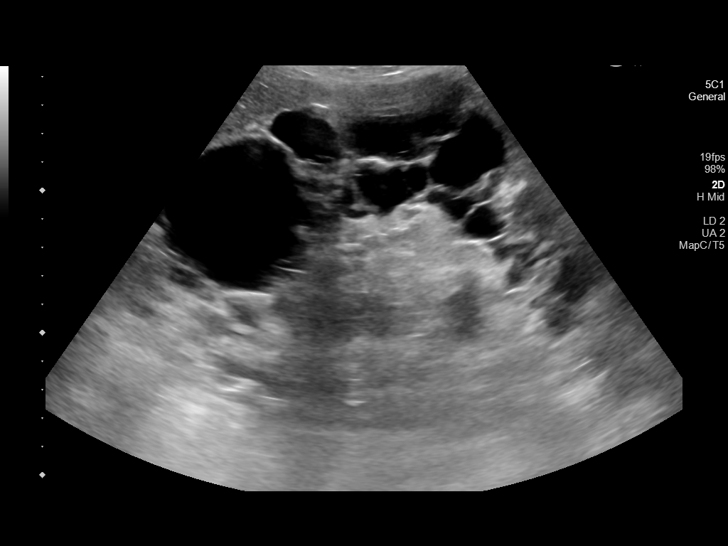
[im 21/61]
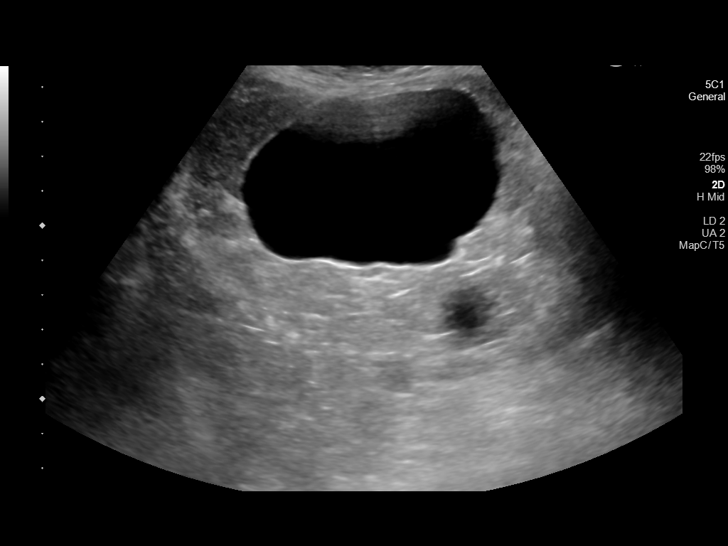
[im 23/61]
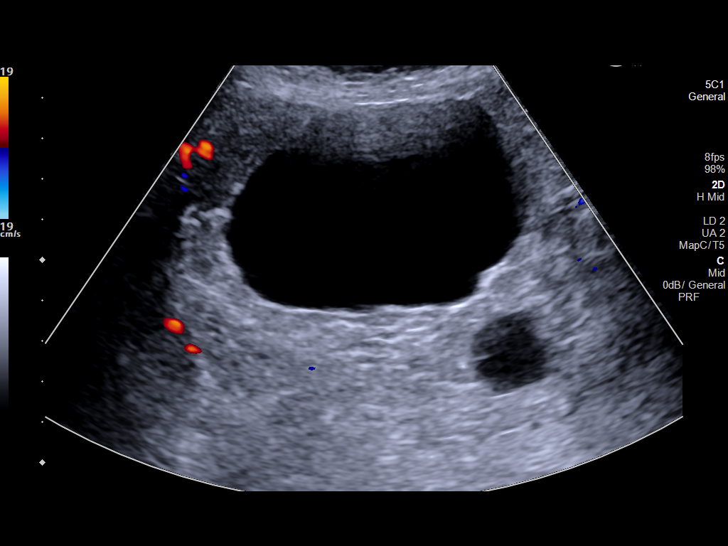
[im 28/61]
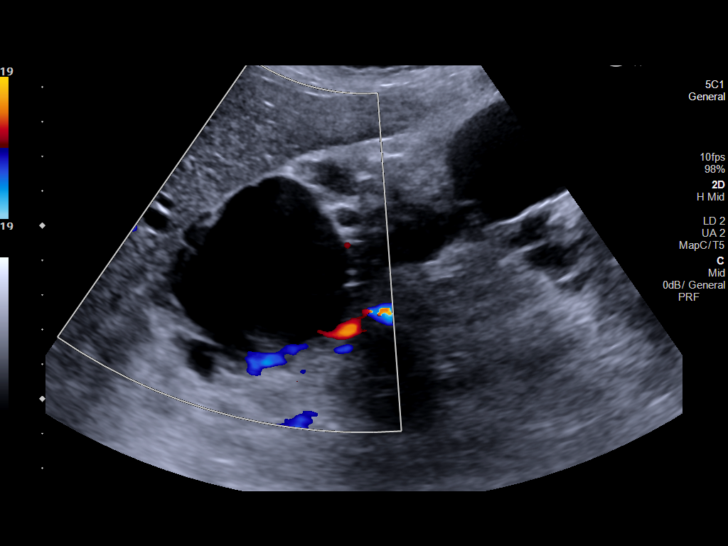
[im 33/61]
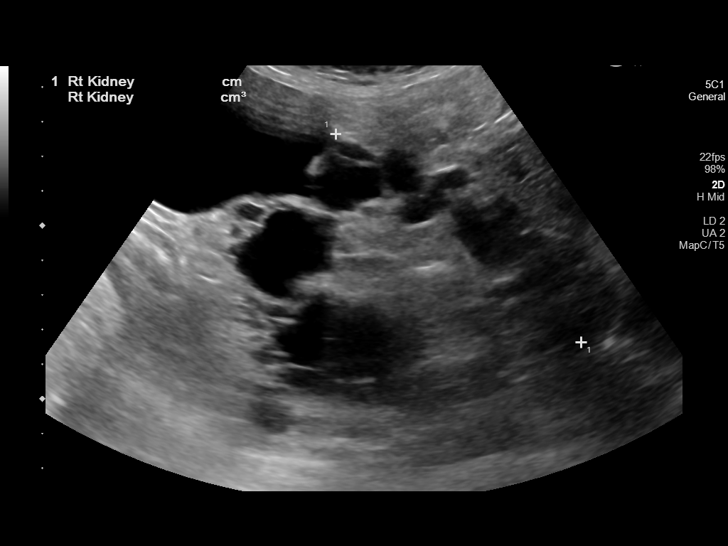
[im 38/61]
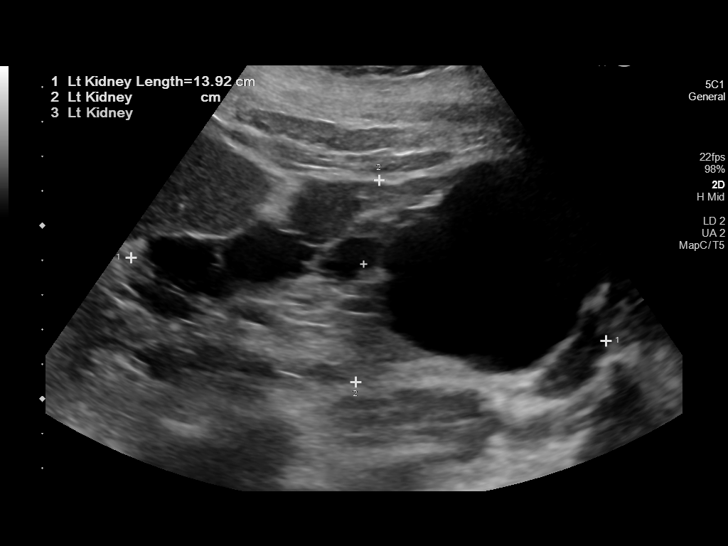
[im 41/61]
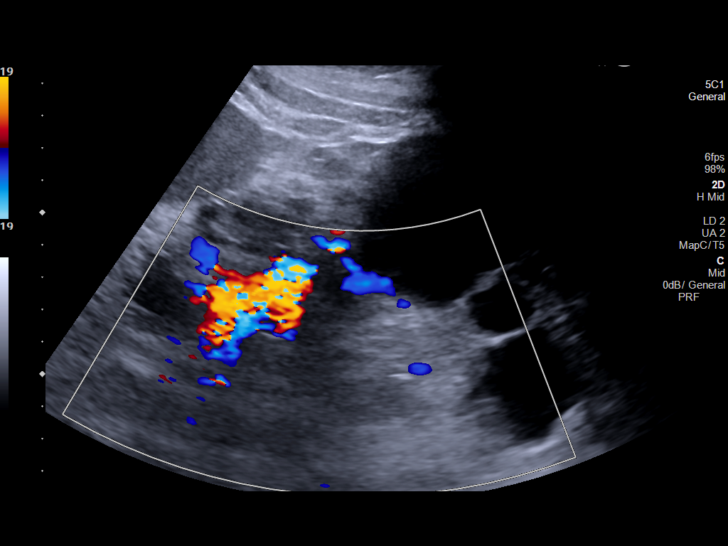
[im 46/61]
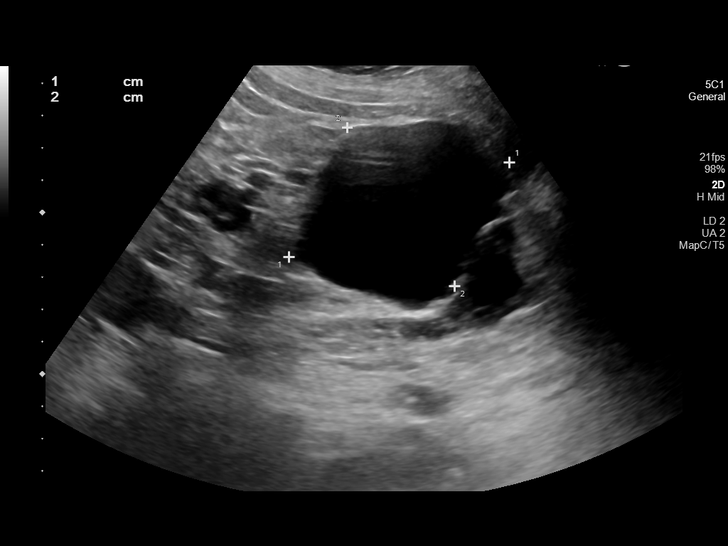
[im 51/61]
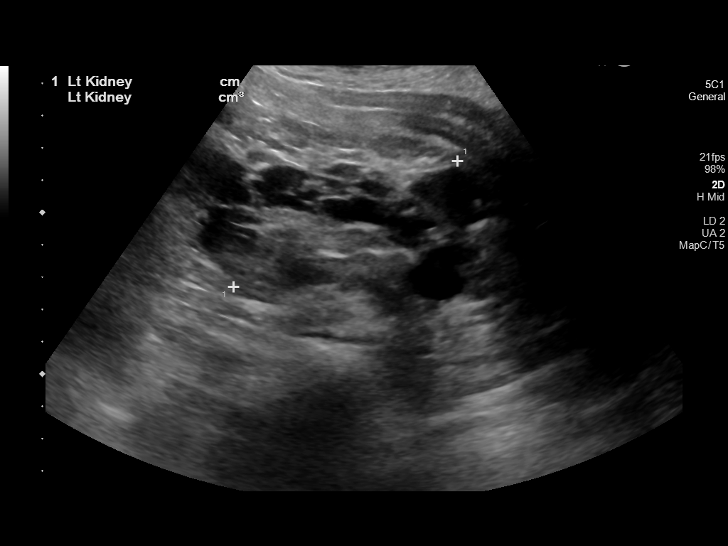
[im 56/61]
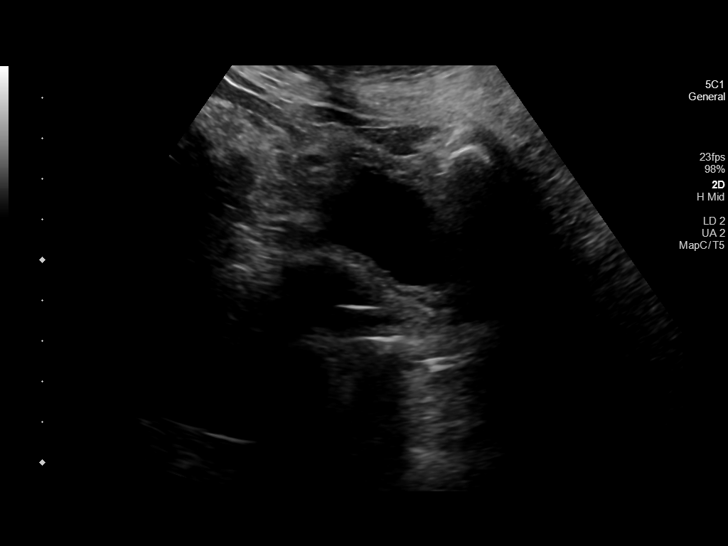
[im 61/61]
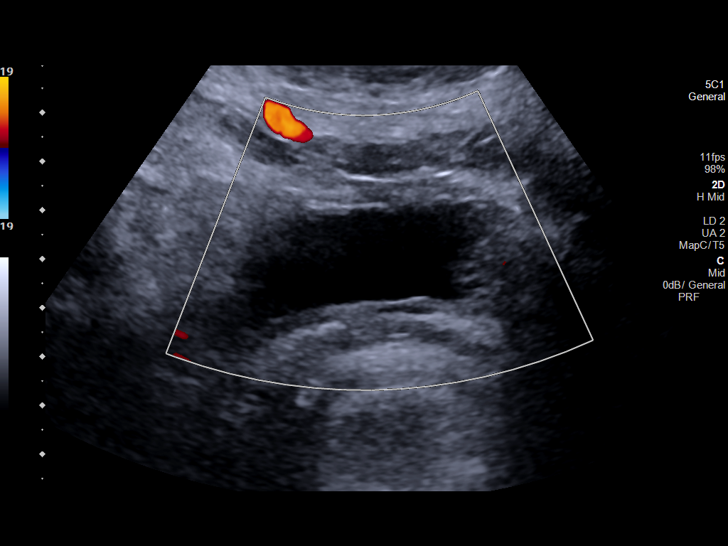

[14 of 25 positions shown; findings below may reference images not displayed]

FINDINGS: Right Kidney:

Renal measurements: 15.1 x 8.5 x 9.3 cm = volume: 622 mL.
Innumerable cysts are noted throughout the right kidney.

Left Kidney:

Renal measurements: 13.9 x 5.9 x 8 cm = volume: 340 mL. Innumerable
cysts are noted throughout the left kidney.

There is a right lower quadrant pelvic transplant kidney without
evidence for hydronephrosis.

Bladder:

Appears normal for degree of bladder distention.

Other:

None.
IMPRESSION: 1. Findings consistent with polycystic kidney disease.
2. Unremarkable appearance of the right pelvic transplant kidney.

## 2022-05-20 IMAGING — DX DG CHEST 1V PORT
1 series · 1 of 1 positions shown · non-contrast
Comparison: Radiographs 10/25/2020 and 10/23/2020.  CT 05/14/2020.

CLINICAL DATA: Bilateral lower extremity swelling. History of
congestive heart failure and COPD.

EXAM:
PORTABLE CHEST 1 VIEW

[chest ap]
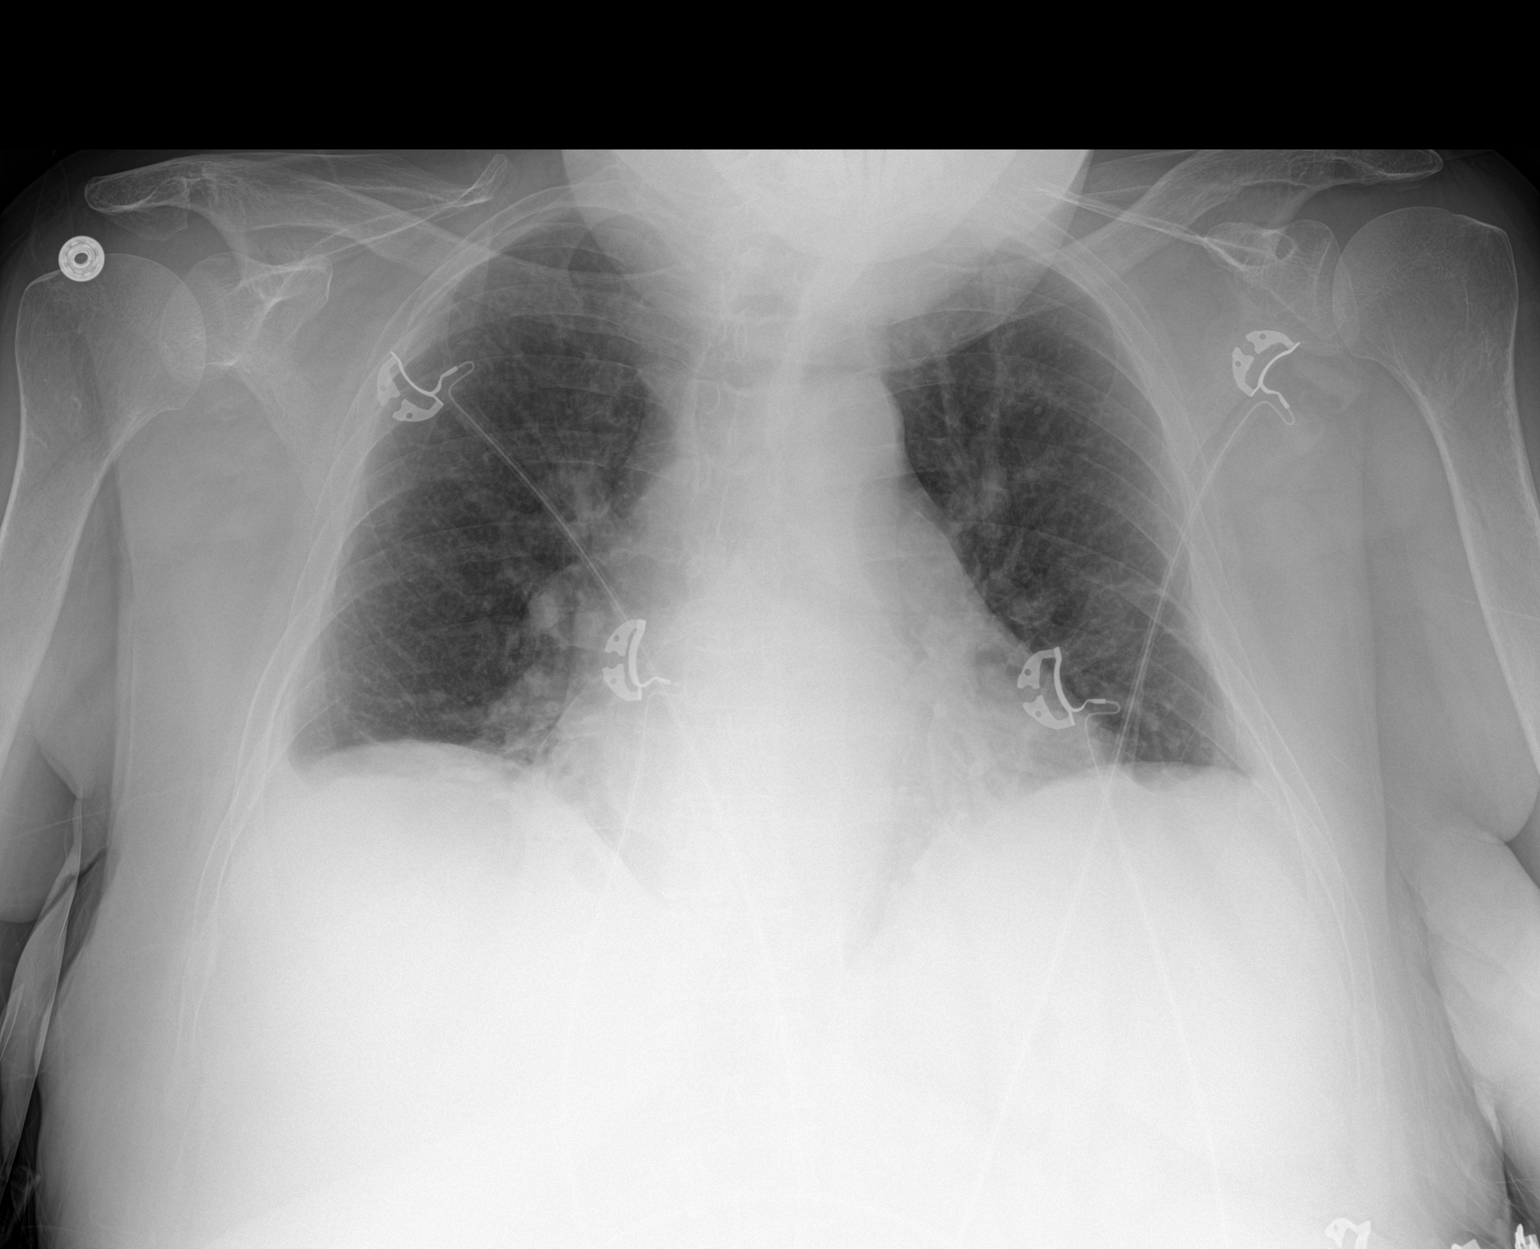

[1 of 1 positions shown; findings below may reference images not displayed]

FINDINGS: 5755 hours. Stable low lung volumes with mild chronic bibasilar
atelectasis. The heart is enlarged. The pulmonary vascularity is
normal, and there is no edema. There is mild blunting of the right
costophrenic angle which could reflect a small right pleural
effusion. No pneumothorax. The bones appear unremarkable. Telemetry
leads overlie the chest.
IMPRESSION: Stable cardiomegaly and possible small right pleural effusion. No
edema. Mild bibasilar atelectasis.

## 2022-06-20 IMAGING — CT CT L SPINE W/O CM
3 series · 13 of 33 positions shown, 16 images · non-contrast
Comparison: 09/04/2020

CLINICAL DATA: Fever, flank pain, status post renal
transplantation. Low back pain.

EXAM:
CT ABDOMEN AND PELVIS WITHOUT CONTRAST
CT LUMBAR SPINE WITHOUT CONTRAST
TECHNIQUE: Multidetector CT imaging of the abdomen and pelvis was performed
following the standard protocol without IV contrast. Subsequently,
dedicated CT imaging of the lumbar spine was performed without
contrast administration. Coronal and sagittal reformats were
created. CT dose reduction techniques including automated exposure
control and iterative reconstruction were utilized where possible.

[Series 2: axial st l-spine · axial · 0.30mm/px · z∈[-294,-141]mm · 5 of 75 slices shown, 7 images]
[im 12/75  soft-tissue]
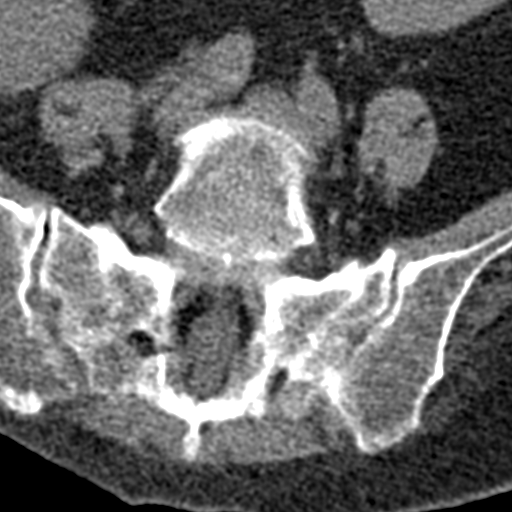
[im 12/75  bone]
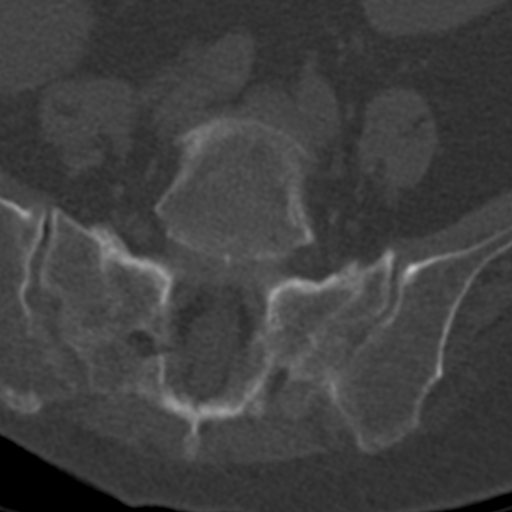
[im 23/75  bone]
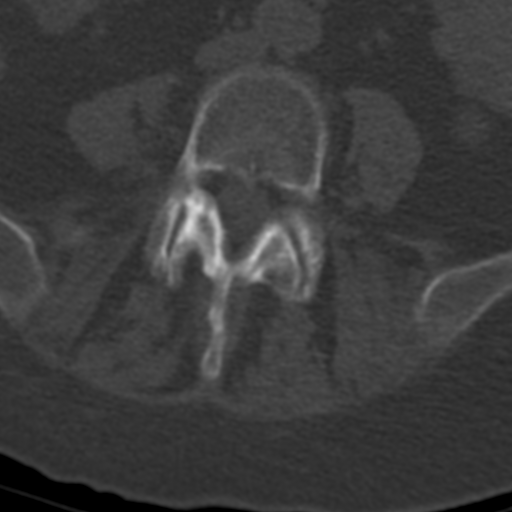
[im 40/75  bone]
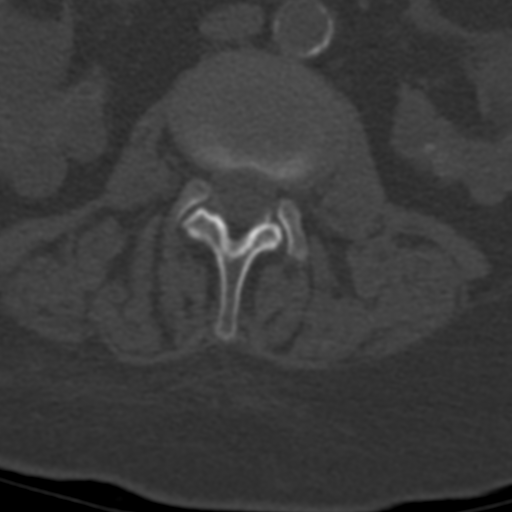
[im 52/75  bone]
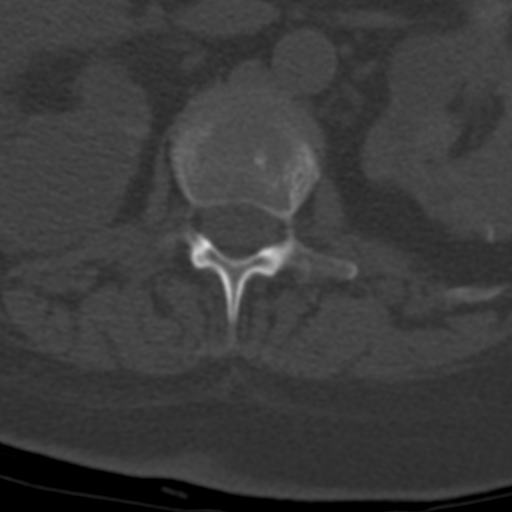
[im 63/75  soft-tissue]
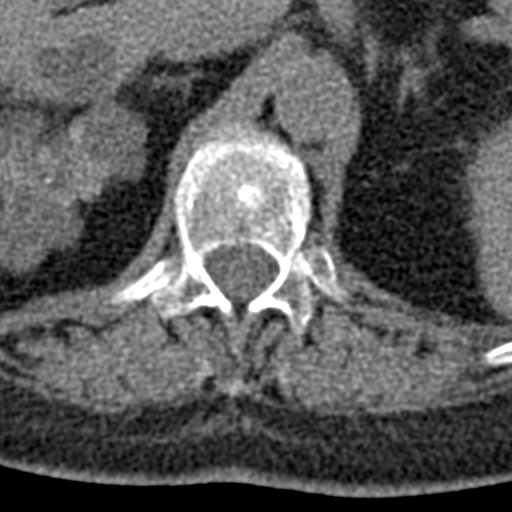
[im 63/75  bone]
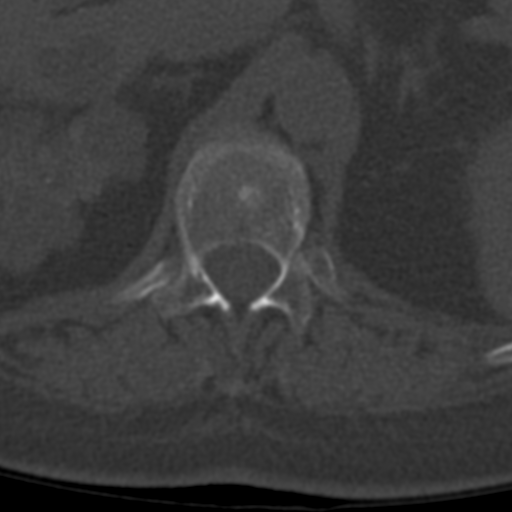

[Series 503: coronal l-spine · coronal · 0.44mm/px · 3 of 54 slices shown]
[im 11/54  bone]
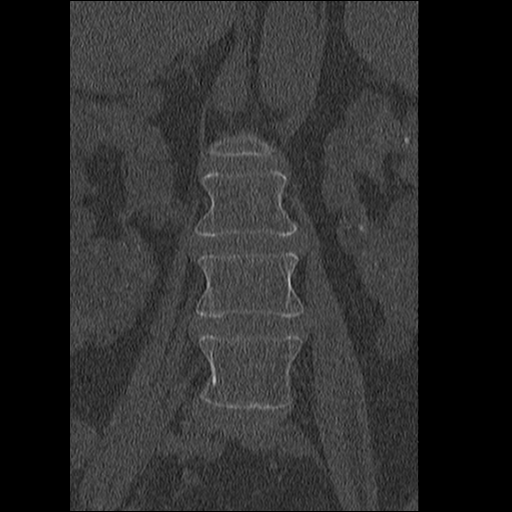
[im 22/54  bone]
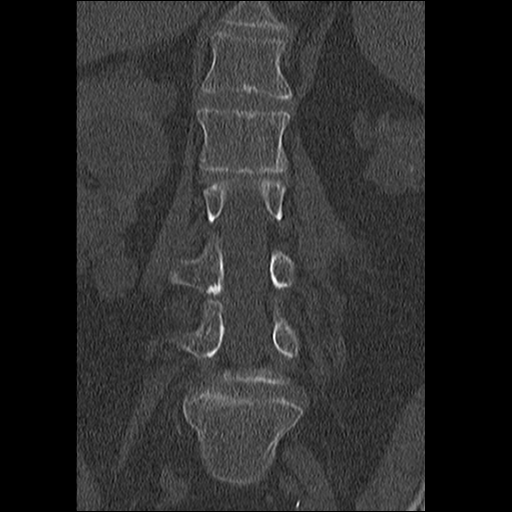
[im 32/54  bone]
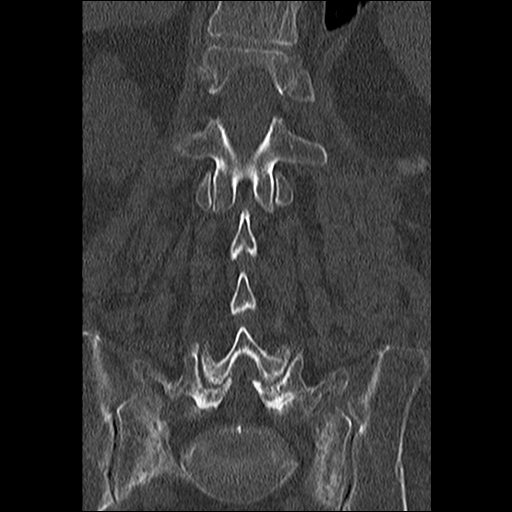

[Series 504: sagital l-spine · sagittal · 0.44mm/px · 5 of 37 slices shown, 6 images]
[im 13/37  bone]
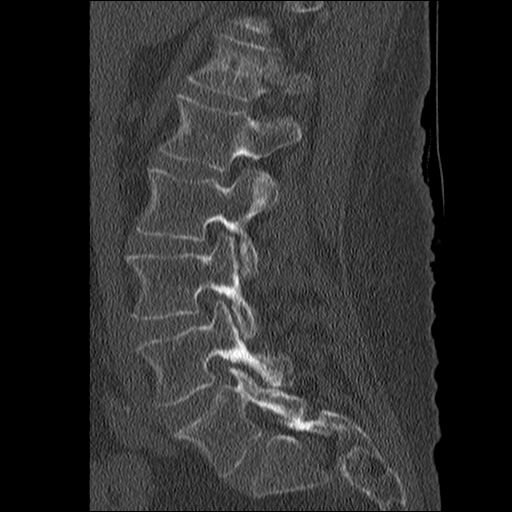
[im 16/37  bone]
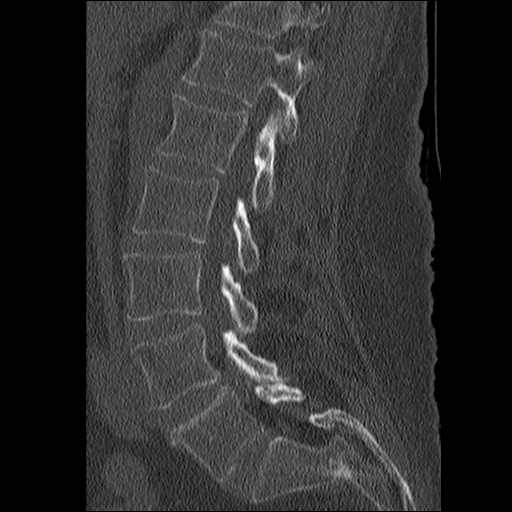
[im 19/37  soft-tissue]
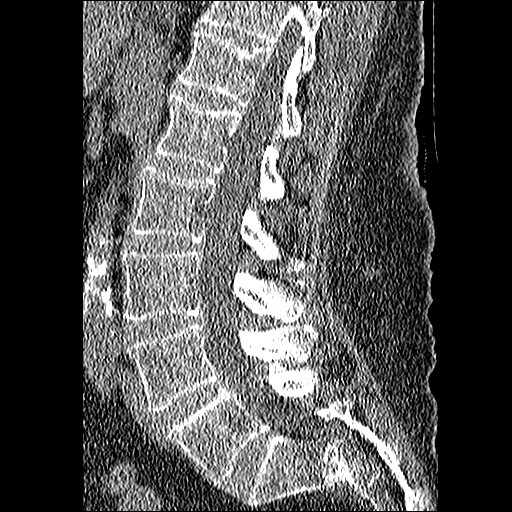
[im 19/37  bone]
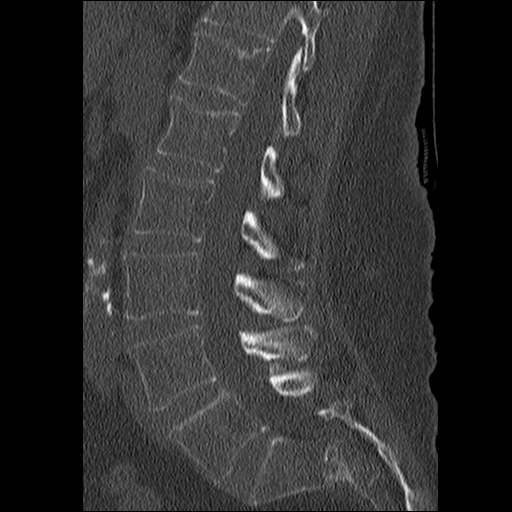
[im 22/37  bone]
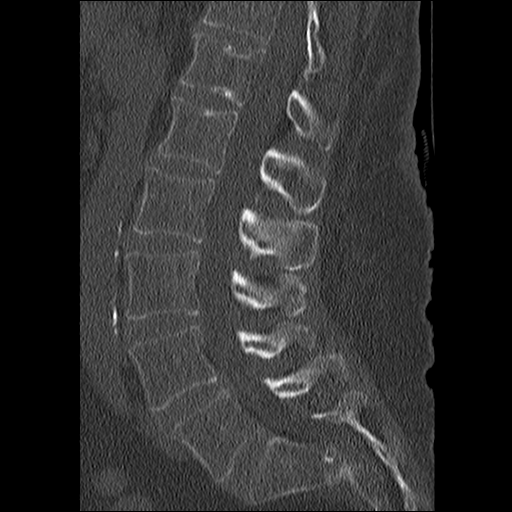
[im 25/37  bone]
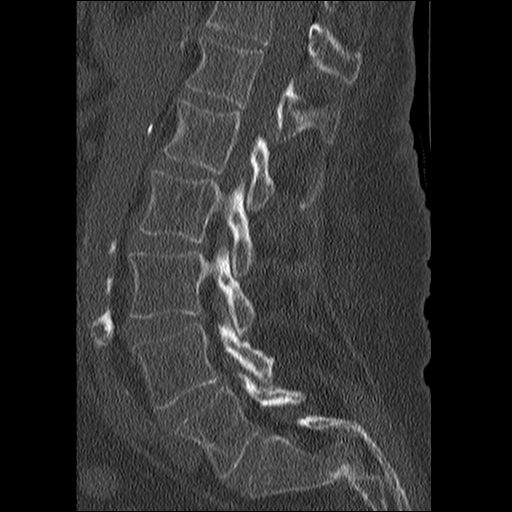

[13 of 33 positions shown; findings below may reference images not displayed]

FINDINGS: Lower chest: Mild bibasilar atelectasis. Trace right pleural
effusion. Cardiac size within normal limits. Trace pericardial fluid
is likely physiologic.

Hepatobiliary: Multiple simple cysts are seen scattered throughout
the liver in keeping with autosomal dominant polycystic kidney
disease. Dominant cyst within the left hepatic lobe is unchanged
measuring 3.2 cm. Liver size within normal limits. Progressive mild
intrahepatic biliary ductal dilation within segment 3 of the liver
likely related to multiple central cysts within the left hepatic
lobe. Stable mild peripheral biliary ductal dilation within segment
7 of the liver. No extrahepatic biliary ductal dilation. Gallbladder
unremarkable.

Pancreas: Unremarkable

Spleen: Unremarkable

Adrenals/Urinary Tract: The adrenal glands are unremarkable. The
native kidneys are normal in position and are enlarged with
innumerable cortical cysts identified in keeping with changes of
polycystic kidney disease. More solid-appearing 3.2 cm rounded
lesion within the lower pole of the right kidney, axial image #
50/2, appears smaller than on prior examination and likely
represents a hyperdense renal cyst. No hydronephrosis.

Transplant kidney identified within the right iliac fossa. Normal
size and cortical thickness. No hydronephrosis. No intrarenal
calcifications. Evaluation of the pelvis is limited by streak
artifact from left total hip arthroplasty. The visualized bladder is
decompressed. A a punctate focus of nondependent gas within the
bladder lumen is nonspecific, possibly related to recent
catheterization.

Stomach/Bowel: Moderate to severe sigmoid diverticulosis. There is
superimposed pericolonic inflammatory change identified within the
mid sigmoid colon in keeping with changes of mild sigmoid
diverticulitis. No free intraperitoneal gas or loculated
intra-abdominal fluid collections. No evidence of obstruction. The
stomach, small bowel, and large bowel are otherwise unremarkable.
Appendix normal. No free intraperitoneal fluid.

Vascular/Lymphatic: Moderate aortoiliac atherosclerotic
calcification. No aortic aneurysm. No pathologic adenopathy within
the abdomen and pelvis.

Reproductive: Uterus and bilateral adnexa are unremarkable.

Other: Small fat containing umbilical hernia. Broad-based right
flank incisional hernia containing unremarkable loops of large and
small bowel as well as the appendix. 2.6 cm left inguinal soft
tissue nodule is again identified and is unchanged from prior
examination, indeterminate.

Musculoskeletal: Left total hip arthroplasty has been performed.
Chronic fractures of the sacral ala bilaterally are again
identified. No acute bone abnormality.

Sagittal and coronal reformats of the lumbar spine demonstrate
normal lumbar lordosis. No acute fracture or listhesis of the lumbar
spine. Chronic angulated fracture deformity of S2 is again seen,
unchanged from prior CT examination. Chronic bilateral sacral ale
are fractures identified, also unchanged from prior examination.
Vertebral body height and intervertebral disc heights are preserved.

Review of the axial images demonstrates:

T12-L1: Intervertebral disc height preserved. No significant canal
stenosis. No neural foraminal narrowing. No significant facet
arthrosis.

L1-2: Disc height preserved. No significant canal stenosis. No
neural foraminal narrowing. No significant facet arthrosis.

L2-3: Disc height preserved. Spinal canal is widely patent. No
significant neural foraminal narrowing. No significant facet
arthrosis.

L3-4: Disc height preserved. Mild broad-based posterior disc bulge.
Mild hypertrophy of the lamina propria. Mild resultant central canal
stenosis. No significant neural foraminal narrowing.

L4-5: Disc height preserved. Mild broad-based posterior disc bulge.
Moderate bilateral facet arthrosis. Mild hypertrophy of the lamina
propria. Moderate resultant central canal stenosis. No significant
neural foraminal narrowing. Possible impingement of the traversing
L5 nerve roots bilaterally within the lateral recess. This is not
well assessed on this examination.

L5-S1: Disc height preserved. Spinal canal widely patent. No
significant neural foraminal narrowing. Mild bilateral facet
arthrosis.
IMPRESSION: Innumerable bilateral renal cortical cysts and multiple hepatic
cysts in keeping with age is of autosomal dominant polycystic kidney
disease. Normal appearance of the a right lower quadrant transplant
kidney.

Mild, uncomplicated sigmoid diverticulitis. Background moderate to
severe sigmoid diverticulosis.

Chronic bilateral sacral ala and angulated S2 fractures. No acute
fracture or listhesis of the lumbar spine.

Aortic Atherosclerosis ([id number]-2ZD.D).

## 2022-06-20 IMAGING — CT CT ABD-PELV W/O CM
2 of 4 series · 13 of 46 positions shown, 15 images · non-contrast
Comparison: 09/04/2020

CLINICAL DATA: Fever, flank pain, status post renal
transplantation. Low back pain.

EXAM:
CT ABDOMEN AND PELVIS WITHOUT CONTRAST
CT LUMBAR SPINE WITHOUT CONTRAST
TECHNIQUE: Multidetector CT imaging of the abdomen and pelvis was performed
following the standard protocol without IV contrast. Subsequently,
dedicated CT imaging of the lumbar spine was performed without
contrast administration. Coronal and sagittal reformats were
created. CT dose reduction techniques including automated exposure
control and iterative reconstruction were utilized where possible.

[Series 2: axial st · axial · 0.86mm/px · z∈[-444,-24]mm · 10 of 96 slices shown, 12 images]
[im 6/96  soft-tissue]
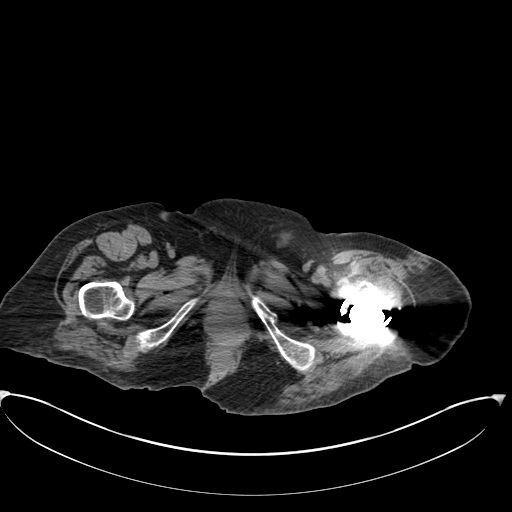
[im 6/96  bone]
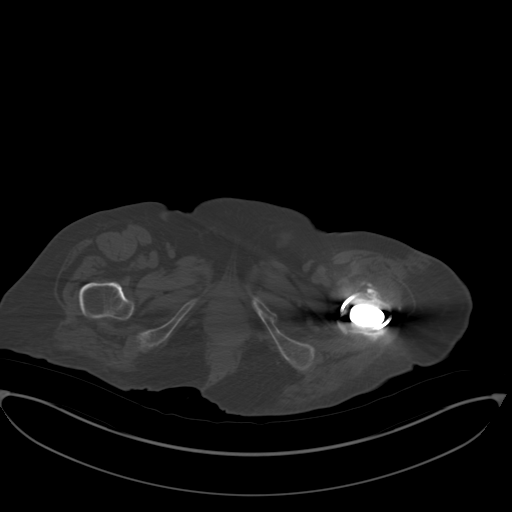
[im 16/96  soft-tissue]
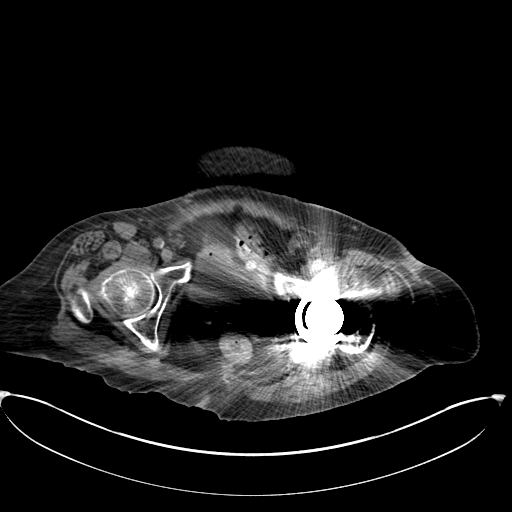
[im 27/96  soft-tissue]
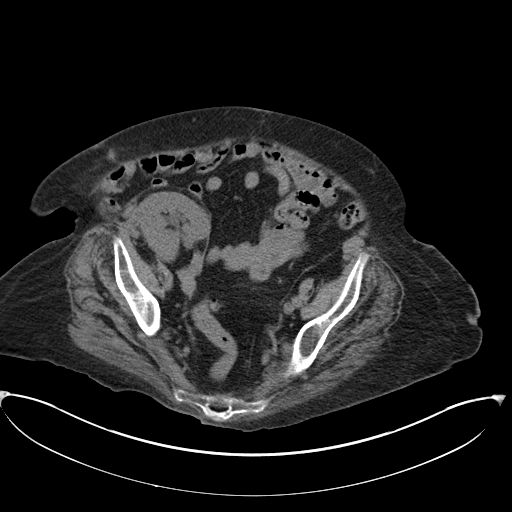
[im 32/96  soft-tissue]
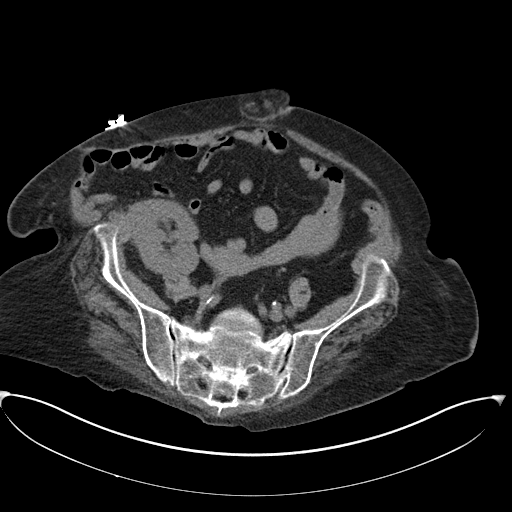
[im 43/96  soft-tissue]
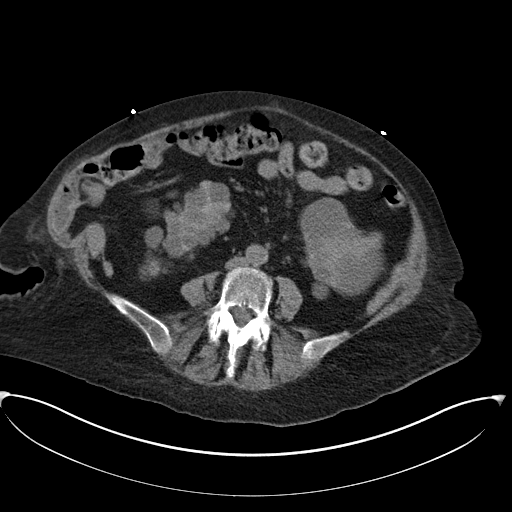
[im 53/96  soft-tissue]
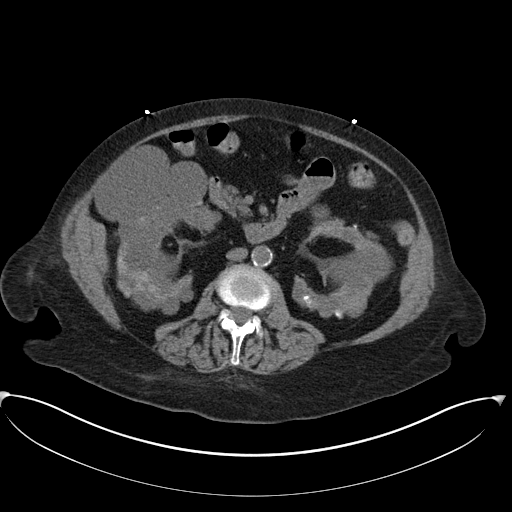
[im 64/96  soft-tissue]
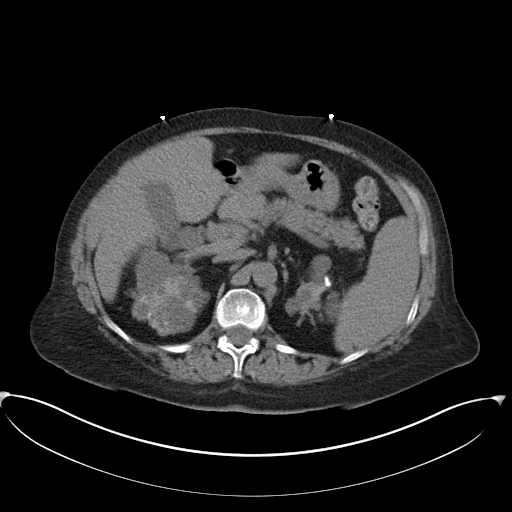
[im 69/96  soft-tissue]
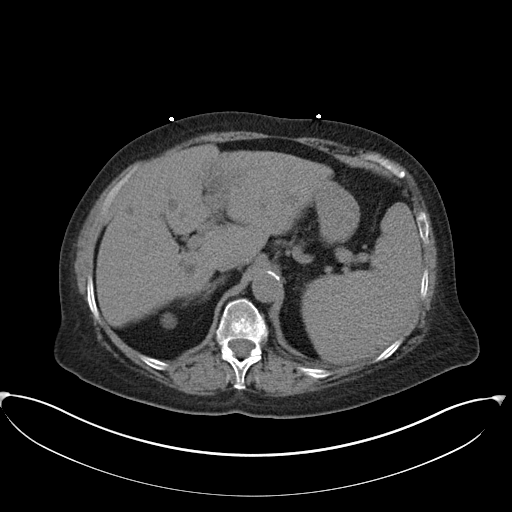
[im 80/96  soft-tissue]
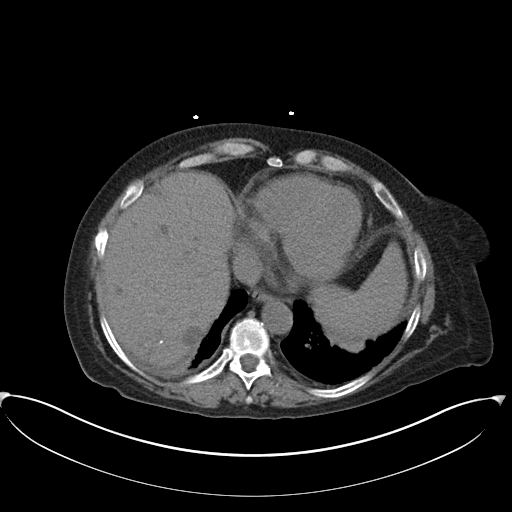
[im 80/96  bone]
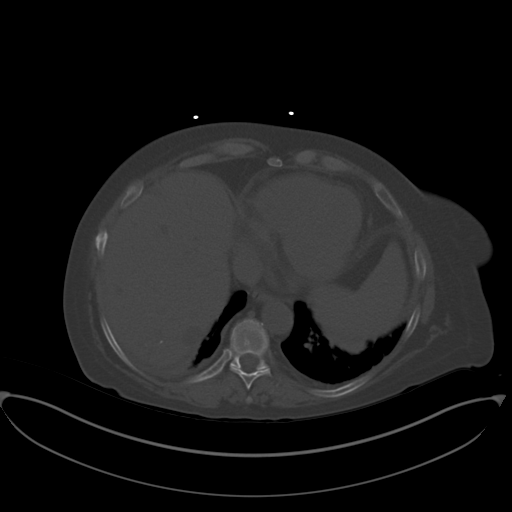
[im 90/96  soft-tissue]
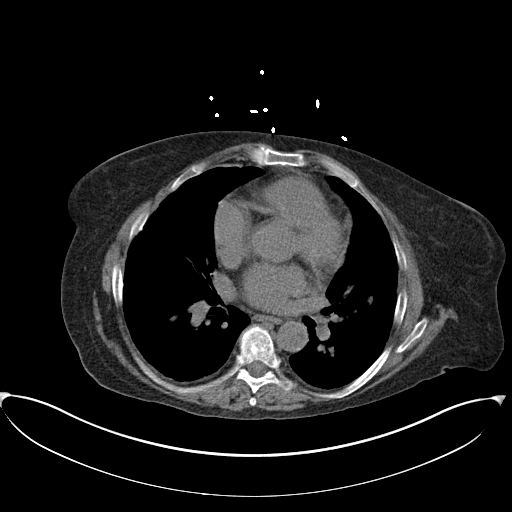

[Series 5: coronal st · coronal · 0.83mm/px · 3 of 168 slices shown]
[im 56/168  soft-tissue]
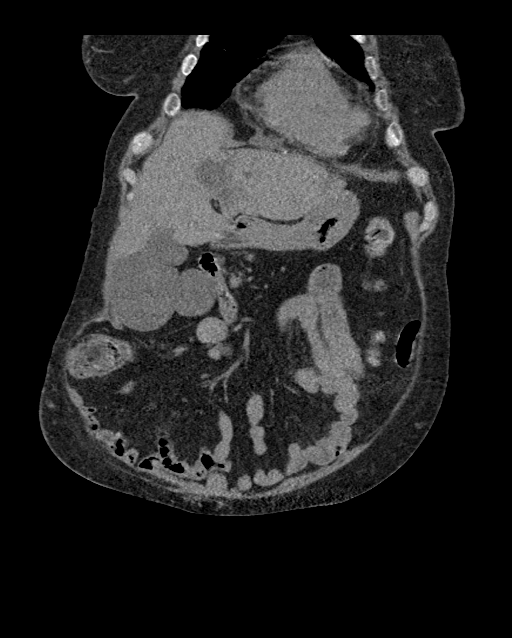
[im 75/168  soft-tissue]
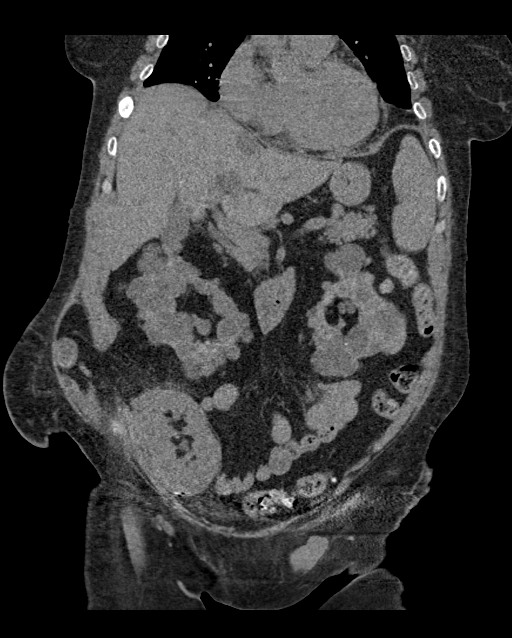
[im 93/168  soft-tissue]
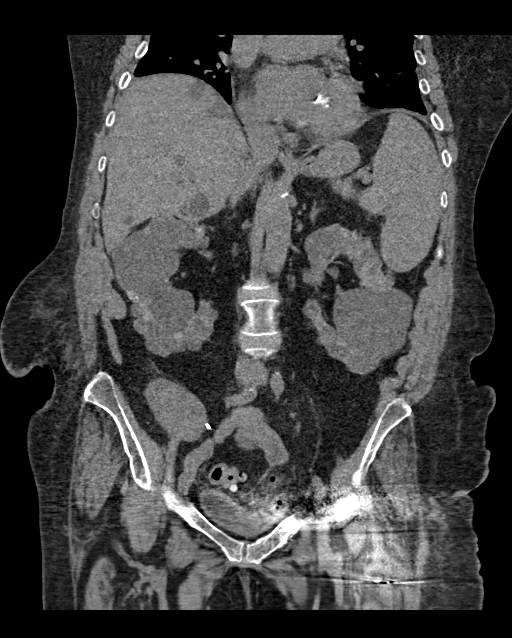

[13 of 46 positions shown; findings below may reference images not displayed]

FINDINGS: Lower chest: Mild bibasilar atelectasis. Trace right pleural
effusion. Cardiac size within normal limits. Trace pericardial fluid
is likely physiologic.

Hepatobiliary: Multiple simple cysts are seen scattered throughout
the liver in keeping with autosomal dominant polycystic kidney
disease. Dominant cyst within the left hepatic lobe is unchanged
measuring 3.2 cm. Liver size within normal limits. Progressive mild
intrahepatic biliary ductal dilation within segment 3 of the liver
likely related to multiple central cysts within the left hepatic
lobe. Stable mild peripheral biliary ductal dilation within segment
7 of the liver. No extrahepatic biliary ductal dilation. Gallbladder
unremarkable.

Pancreas: Unremarkable

Spleen: Unremarkable

Adrenals/Urinary Tract: The adrenal glands are unremarkable. The
native kidneys are normal in position and are enlarged with
innumerable cortical cysts identified in keeping with changes of
polycystic kidney disease. More solid-appearing 3.2 cm rounded
lesion within the lower pole of the right kidney, axial image #
50/2, appears smaller than on prior examination and likely
represents a hyperdense renal cyst. No hydronephrosis.

Transplant kidney identified within the right iliac fossa. Normal
size and cortical thickness. No hydronephrosis. No intrarenal
calcifications. Evaluation of the pelvis is limited by streak
artifact from left total hip arthroplasty. The visualized bladder is
decompressed. A a punctate focus of nondependent gas within the
bladder lumen is nonspecific, possibly related to recent
catheterization.

Stomach/Bowel: Moderate to severe sigmoid diverticulosis. There is
superimposed pericolonic inflammatory change identified within the
mid sigmoid colon in keeping with changes of mild sigmoid
diverticulitis. No free intraperitoneal gas or loculated
intra-abdominal fluid collections. No evidence of obstruction. The
stomach, small bowel, and large bowel are otherwise unremarkable.
Appendix normal. No free intraperitoneal fluid.

Vascular/Lymphatic: Moderate aortoiliac atherosclerotic
calcification. No aortic aneurysm. No pathologic adenopathy within
the abdomen and pelvis.

Reproductive: Uterus and bilateral adnexa are unremarkable.

Other: Small fat containing umbilical hernia. Broad-based right
flank incisional hernia containing unremarkable loops of large and
small bowel as well as the appendix. 2.6 cm left inguinal soft
tissue nodule is again identified and is unchanged from prior
examination, indeterminate.

Musculoskeletal: Left total hip arthroplasty has been performed.
Chronic fractures of the sacral ala bilaterally are again
identified. No acute bone abnormality.

Sagittal and coronal reformats of the lumbar spine demonstrate
normal lumbar lordosis. No acute fracture or listhesis of the lumbar
spine. Chronic angulated fracture deformity of S2 is again seen,
unchanged from prior CT examination. Chronic bilateral sacral ale
are fractures identified, also unchanged from prior examination.
Vertebral body height and intervertebral disc heights are preserved.

Review of the axial images demonstrates:

T12-L1: Intervertebral disc height preserved. No significant canal
stenosis. No neural foraminal narrowing. No significant facet
arthrosis.

L1-2: Disc height preserved. No significant canal stenosis. No
neural foraminal narrowing. No significant facet arthrosis.

L2-3: Disc height preserved. Spinal canal is widely patent. No
significant neural foraminal narrowing. No significant facet
arthrosis.

L3-4: Disc height preserved. Mild broad-based posterior disc bulge.
Mild hypertrophy of the lamina propria. Mild resultant central canal
stenosis. No significant neural foraminal narrowing.

L4-5: Disc height preserved. Mild broad-based posterior disc bulge.
Moderate bilateral facet arthrosis. Mild hypertrophy of the lamina
propria. Moderate resultant central canal stenosis. No significant
neural foraminal narrowing. Possible impingement of the traversing
L5 nerve roots bilaterally within the lateral recess. This is not
well assessed on this examination.

L5-S1: Disc height preserved. Spinal canal widely patent. No
significant neural foraminal narrowing. Mild bilateral facet
arthrosis.
IMPRESSION: Innumerable bilateral renal cortical cysts and multiple hepatic
cysts in keeping with age is of autosomal dominant polycystic kidney
disease. Normal appearance of the a right lower quadrant transplant
kidney.

Mild, uncomplicated sigmoid diverticulitis. Background moderate to
severe sigmoid diverticulosis.

Chronic bilateral sacral ala and angulated S2 fractures. No acute
fracture or listhesis of the lumbar spine.

Aortic Atherosclerosis ([id number]-2ZD.D).

## 2022-06-22 IMAGING — DX DG CHEST 1V PORT
1 series · 1 of 1 positions shown · non-contrast
Comparison: Portable exam 4364 hours compared to 12/14/2020

CLINICAL DATA: Weakness, pleural effusions, LEFT lower quadrant
abdominal pain, nausea, dry heaving, pleural effusions by prior
radiograph

EXAM:
PORTABLE CHEST 1 VIEW

[chest ap]
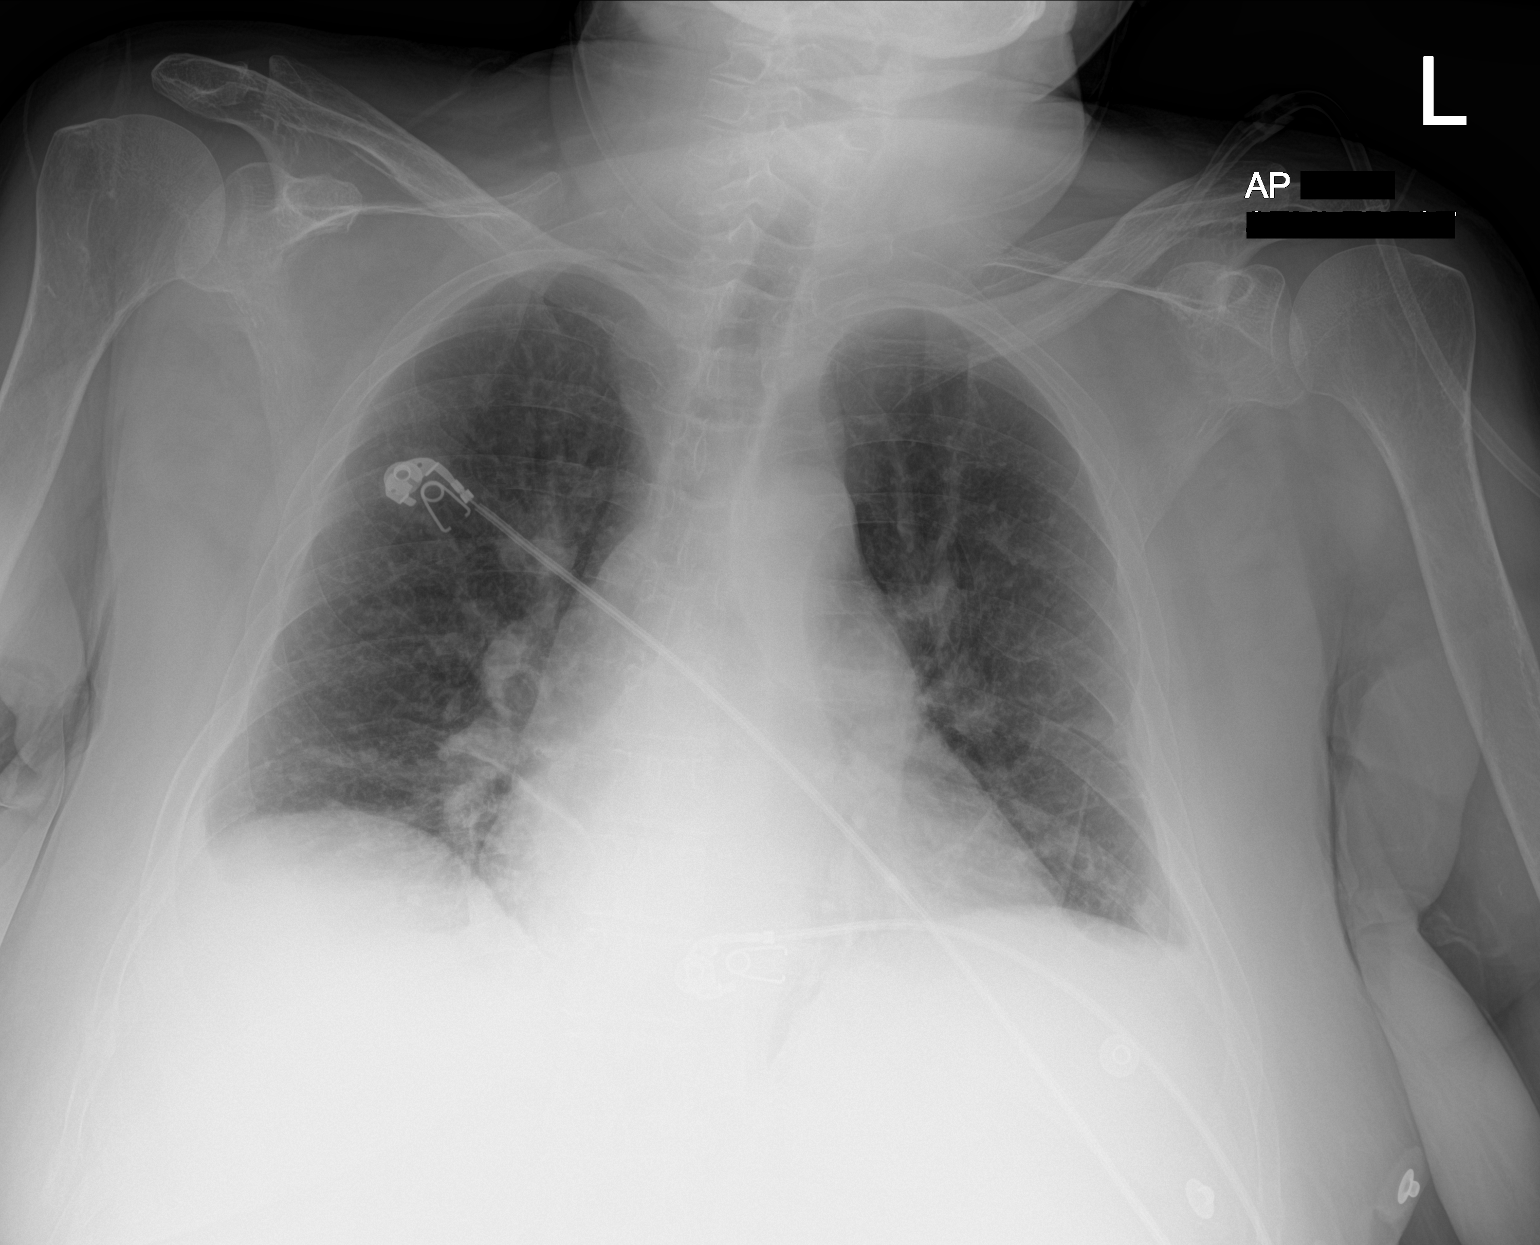

[1 of 1 positions shown; findings below may reference images not displayed]

FINDINGS: Borderline enlargement of cardiac silhouette with slight vascular
congestion.

Mediastinal contours normal.

Bibasilar atelectasis and tiny pleural effusions.

No acute infiltrate/edema or pneumothorax.

Bones demineralized.
IMPRESSION: Enlargement of cardiac silhouette with pulmonary vascular
congestion.

Bibasilar atelectasis and tiny pleural effusions.

## 2022-06-22 IMAGING — CT CT ABD-PELV W/O CM
2 of 4 series · 15 of 46 positions shown, 17 images · non-contrast
Comparison: CT dated December 14, 2020.

CLINICAL DATA: Diverticulitis suspected. Abdominal pain in the left
lower quadrant.

EXAM:
CT ABDOMEN AND PELVIS WITHOUT CONTRAST
TECHNIQUE: Multidetector CT imaging of the abdomen and pelvis was performed
following the standard protocol without IV contrast.

[Series 2: axial st · axial · 0.94mm/px · z∈[+929,+1339]mm · 12 of 94 slices shown, 14 images]
[im 6/94  soft-tissue]
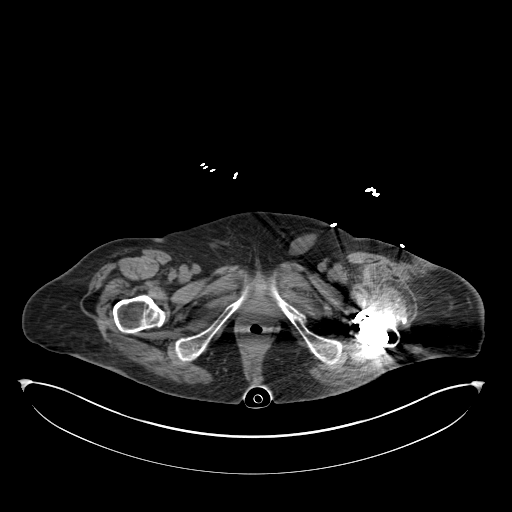
[im 6/94  bone]
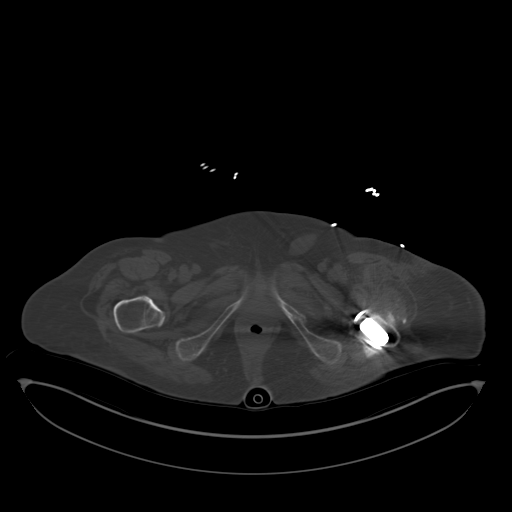
[im 16/94  soft-tissue]
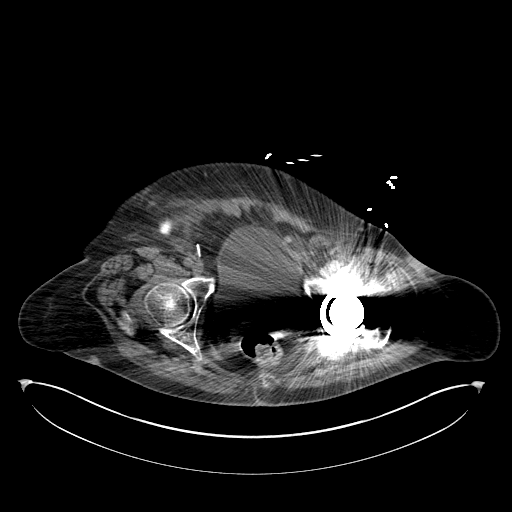
[im 21/94  soft-tissue]
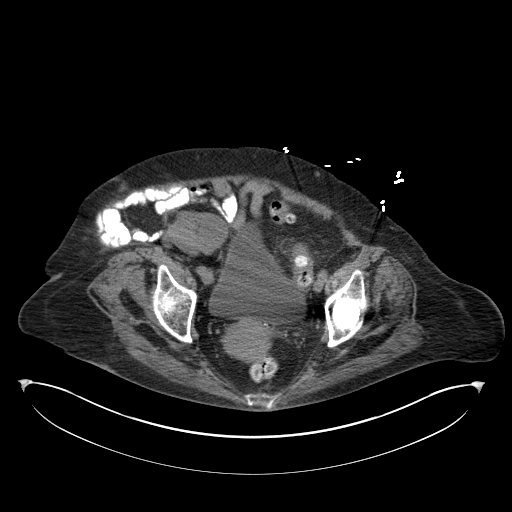
[im 26/94  soft-tissue]
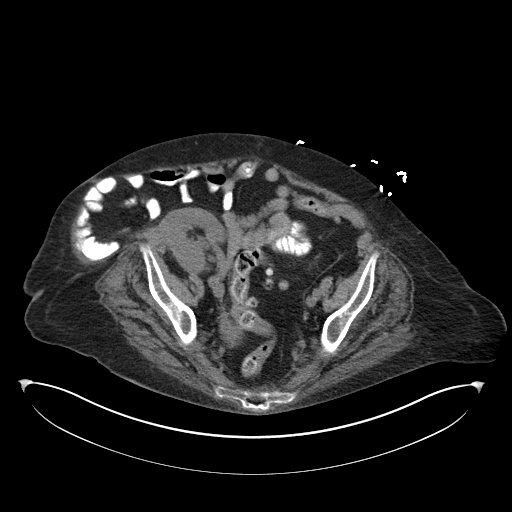
[im 37/94  soft-tissue]
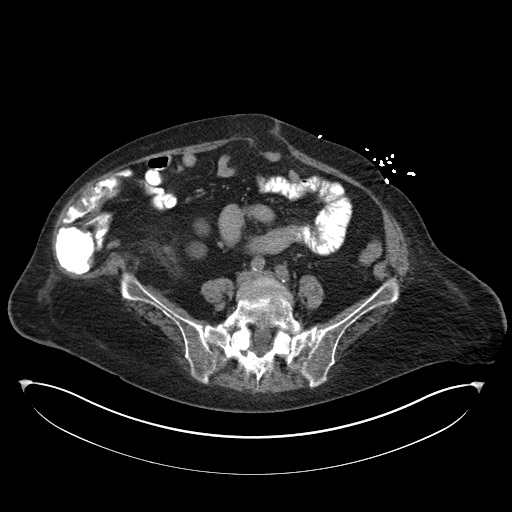
[im 42/94  soft-tissue]
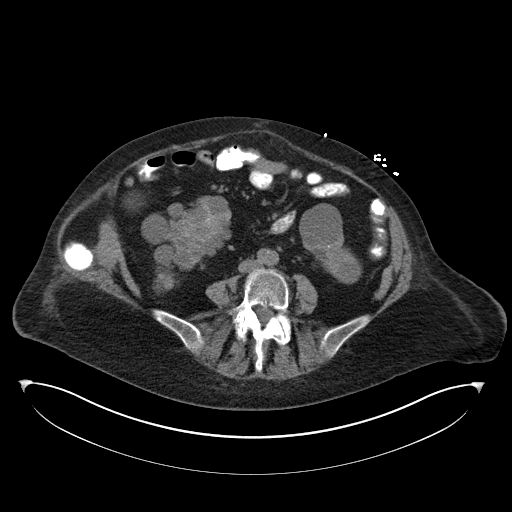
[im 52/94  soft-tissue]
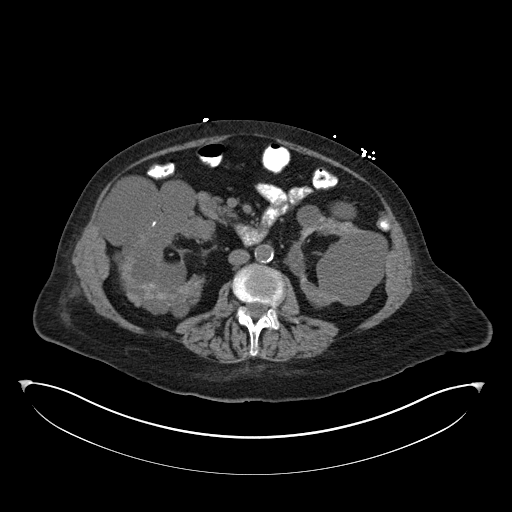
[im 57/94  soft-tissue]
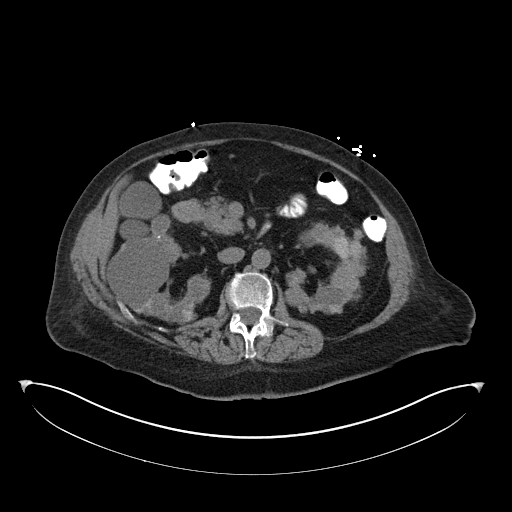
[im 68/94  soft-tissue]
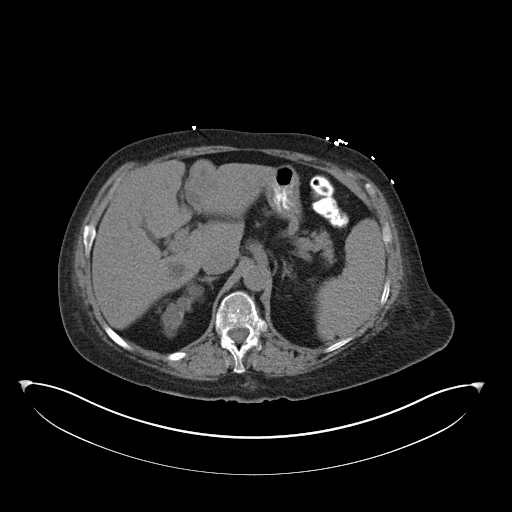
[im 68/94  bone]
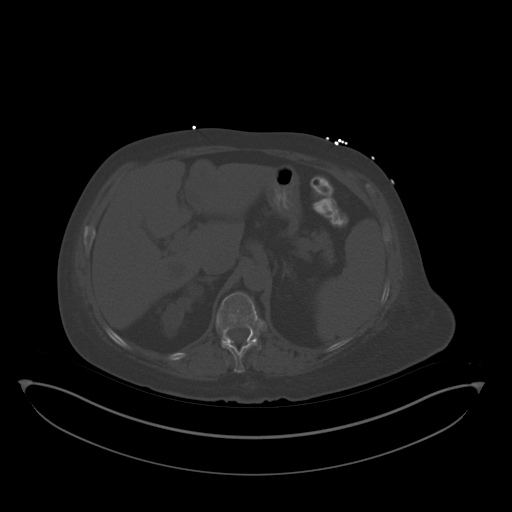
[im 73/94  soft-tissue]
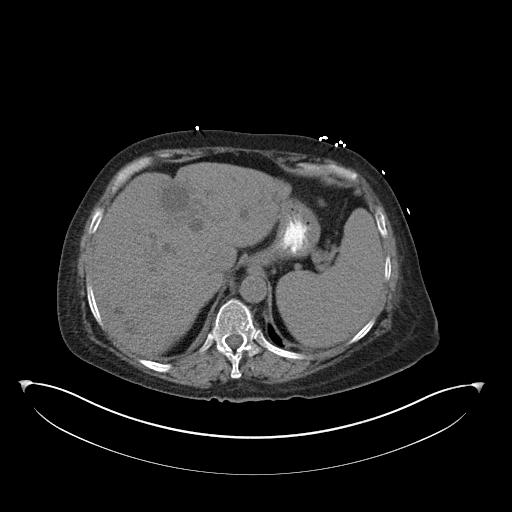
[im 78/94  soft-tissue]
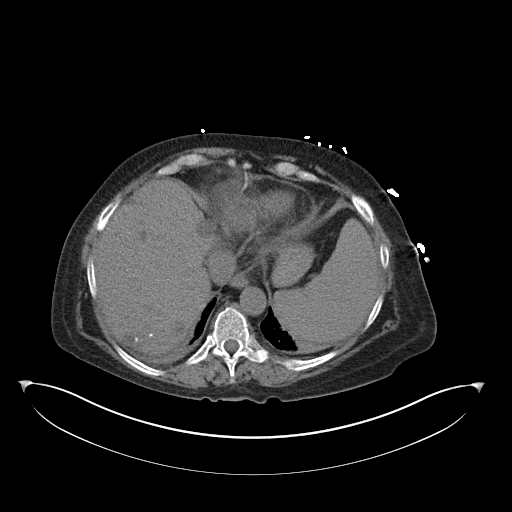
[im 88/94  soft-tissue]
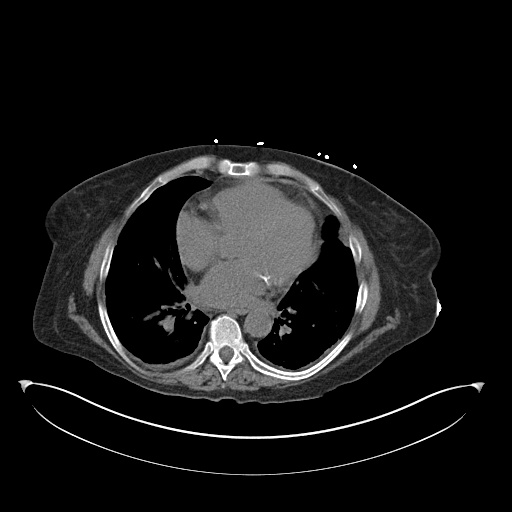

[Series 5: coronal st · coronal · 0.79mm/px · 3 of 150 slices shown]
[im 50/150  soft-tissue]
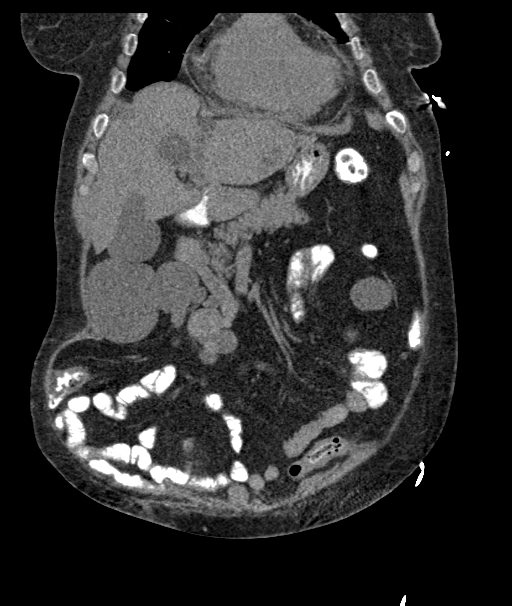
[im 67/150  soft-tissue]
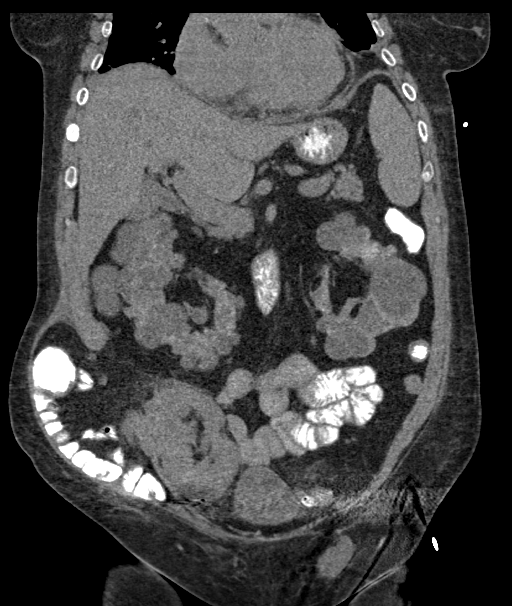
[im 83/150  soft-tissue]
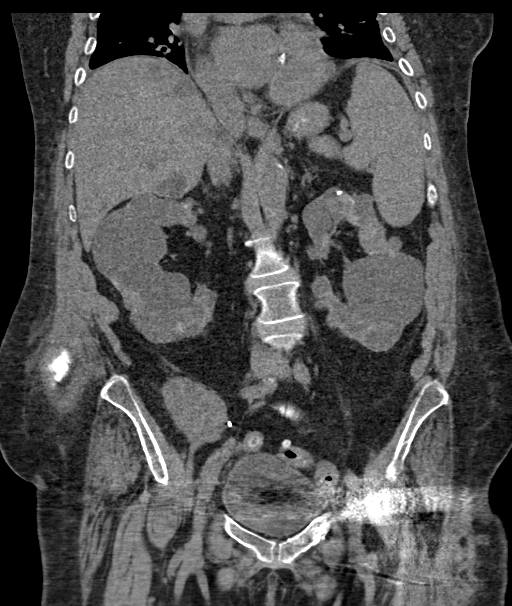

[15 of 46 positions shown; findings below may reference images not displayed]

FINDINGS: Lower chest: There are linear bands of scarring at the lung bases
bilaterally. There is a trace right-sided pleural effusion.The heart
size is mildly enlarged.

Hepatobiliary: Innumerable cysts are noted throughout the patient's
liver. Normal gallbladder.There is no biliary ductal dilation.

Pancreas: Normal contours without ductal dilatation. No
peripancreatic fluid collection.

Spleen: Unremarkable.

Adrenals/Urinary Tract:

--Adrenal glands: Unremarkable.

--Right kidney/ureter: The right kidney is enlarged with multiple
cysts.

--Left kidney/ureter: The left kidney is enlarged with multiple
cysts. There is a right pelvic transplant kidney in place that is
grossly unremarkable on this noncontrast examination.

--Urinary bladder: Unremarkable.

Stomach/Bowel:

--Stomach/Duodenum: No hiatal hernia or other gastric abnormality.
Normal duodenal course and caliber.

--Small bowel: Unremarkable.

--Colon: Again noted are findings of mild uncomplicated sigmoid
diverticulitis (axial series 2, image 74). Innumerable diverticula
are noted throughout the remaining portions of the colon.

--Appendix: Normal.

Vascular/Lymphatic: Atherosclerotic calcification is present within
the non-aneurysmal abdominal aorta, without hemodynamically
significant stenosis.

--No retroperitoneal lymphadenopathy.

--No mesenteric lymphadenopathy.

--No pelvic or inguinal lymphadenopathy.

Reproductive: Unremarkable

Other: No ascites or free air. There is a fat containing umbilical
hernia.

Musculoskeletal. There is no acute fracture. There is an old healed
fracture of the sacrum. The patient is status post prior total hip
arthroplasty on the left. There is an old healed fracture of the
left inferior pubic ramus. There is an old healed fracture of the
superior pubic ramus on the left. There are old healed bilateral rib
fractures. There are old bilateral sacral insufficiency fractures.
IMPRESSION: 1. Persistent findings of mild uncomplicated sigmoid diverticulitis.
2. Polycystic kidney and liver disease as before. Unremarkable
appearance of the right pelvic transplant kidney.
3. Fat containing umbilical hernia.
4. Trace right-sided pleural effusion.
5. Chronic osseous findings as detailed above.

Aortic Atherosclerosis ([id number]).

## 2022-06-23 IMAGING — NM NM PULMONARY PERF PARTICULATE
8 series · 8 of 8 positions shown · non-contrast
Comparison: Chest x-ray 12/16/2020.

CLINICAL DATA: Positive D-dimer.

EXAM:
NUCLEAR MEDICINE PERFUSION LUNG SCAN
TECHNIQUE: Perfusion images were obtained in multiple projections after
intravenous injection of radiopharmaceutical.
Ventilation scans intentionally deferred if perfusion scan and chest
x-ray adequate for interpretation during COVID 19 epidemic.
RADIOPHARMACEUTICALS:  4.0 mCi Tc-FFm MAA IV

[Series 1: ant-post perf · 2.07mm/px · 1 of 1 slices shown (1 of 2)]
[im 1/1]
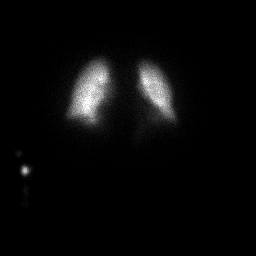

[Series 1: ant-post perf · 2.07mm/px · 1 of 1 slices shown (2 of 2)]
[im 1/1]
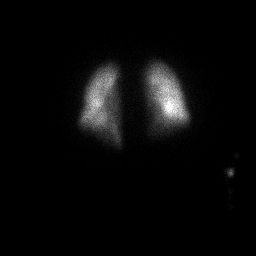

[Series 2: lao-rpo perf · 2.07mm/px · 1 of 1 slices shown (1 of 2)]
[im 1/1]
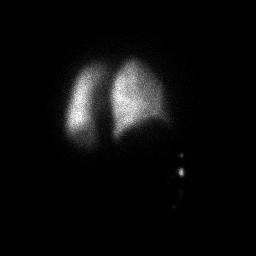

[Series 2: lao-rpo perf · 2.07mm/px · 1 of 1 slices shown (2 of 2)]
[im 1/1]
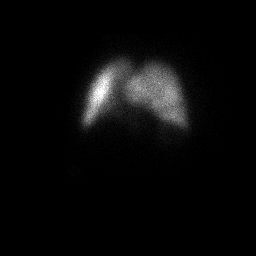

[Series 3: rao-lpo perf · 2.07mm/px · 1 of 1 slices shown (1 of 2)]
[im 1/1]
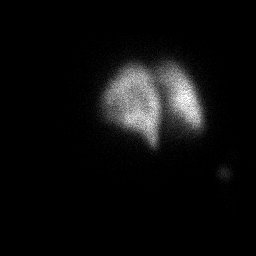

[Series 3: rao-lpo perf · 2.07mm/px · 1 of 1 slices shown (2 of 2)]
[im 1/1]
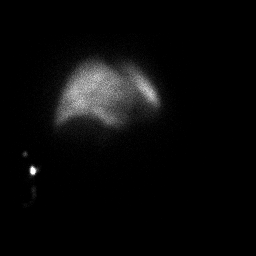

[Series 4: rlat-llat perf · 2.07mm/px · 1 of 1 slices shown (1 of 2)]
[im 1/1]
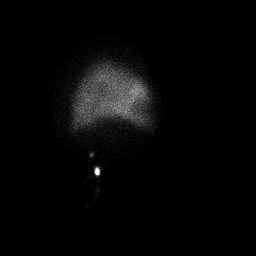

[Series 4: rlat-llat perf · 2.07mm/px · 1 of 1 slices shown (2 of 2)]
[im 1/1]
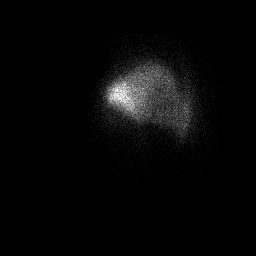

[8 of 8 positions shown; findings below may reference images not displayed]

FINDINGS: Perfusion study only performed. No prominent segmental defect noted
to suggest pulmonary embolus. Low probability pulmonary embolus.
IMPRESSION: Low probability pulmonary embolus.

## 2022-06-24 IMAGING — DX DG CHEST 1V PORT
1 series · 1 of 1 positions shown · non-contrast
Comparison: Radiographs 12/16/2020 and 12/14/2020.  CT 05/14/2020.

CLINICAL DATA: Shortness of breath.

EXAM:
PORTABLE CHEST 1 VIEW

[chest ap]
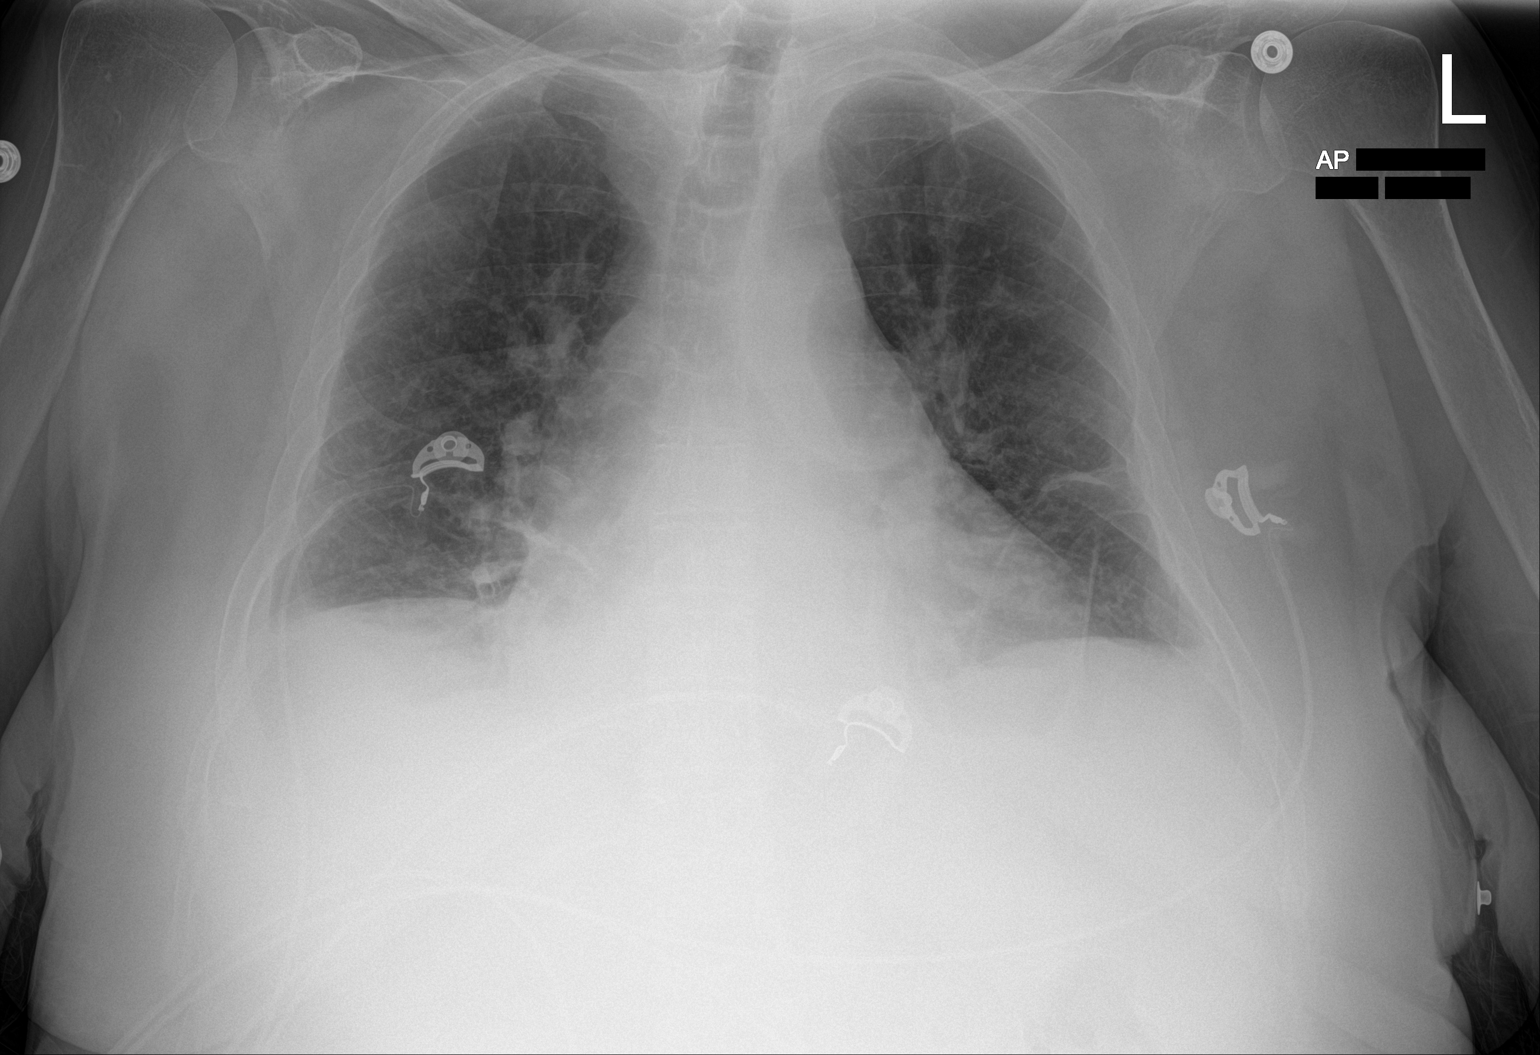

[1 of 1 positions shown; findings below may reference images not displayed]

FINDINGS: 1657 hours. The heart size is stable at the upper limits of normal
for portable technique. There is stable vascular congestion,
bibasilar atelectasis and trace bilateral pleural effusions. No
changes are seen compared with the recent prior studies. The bones
appear unremarkable. Telemetry leads overlie the chest.
IMPRESSION: Stable chest. Vascular congestion, bibasilar atelectasis and trace
bilateral pleural effusions appear unchanged.

## 2022-07-01 IMAGING — DX DG CHEST 1V PORT
1 series · 1 of 1 positions shown · non-contrast
Comparison: Chest x-ray 12/18/2020

CLINICAL DATA: Abnormal respirations.

EXAM:
PORTABLE CHEST 1 VIEW

[chest ap]
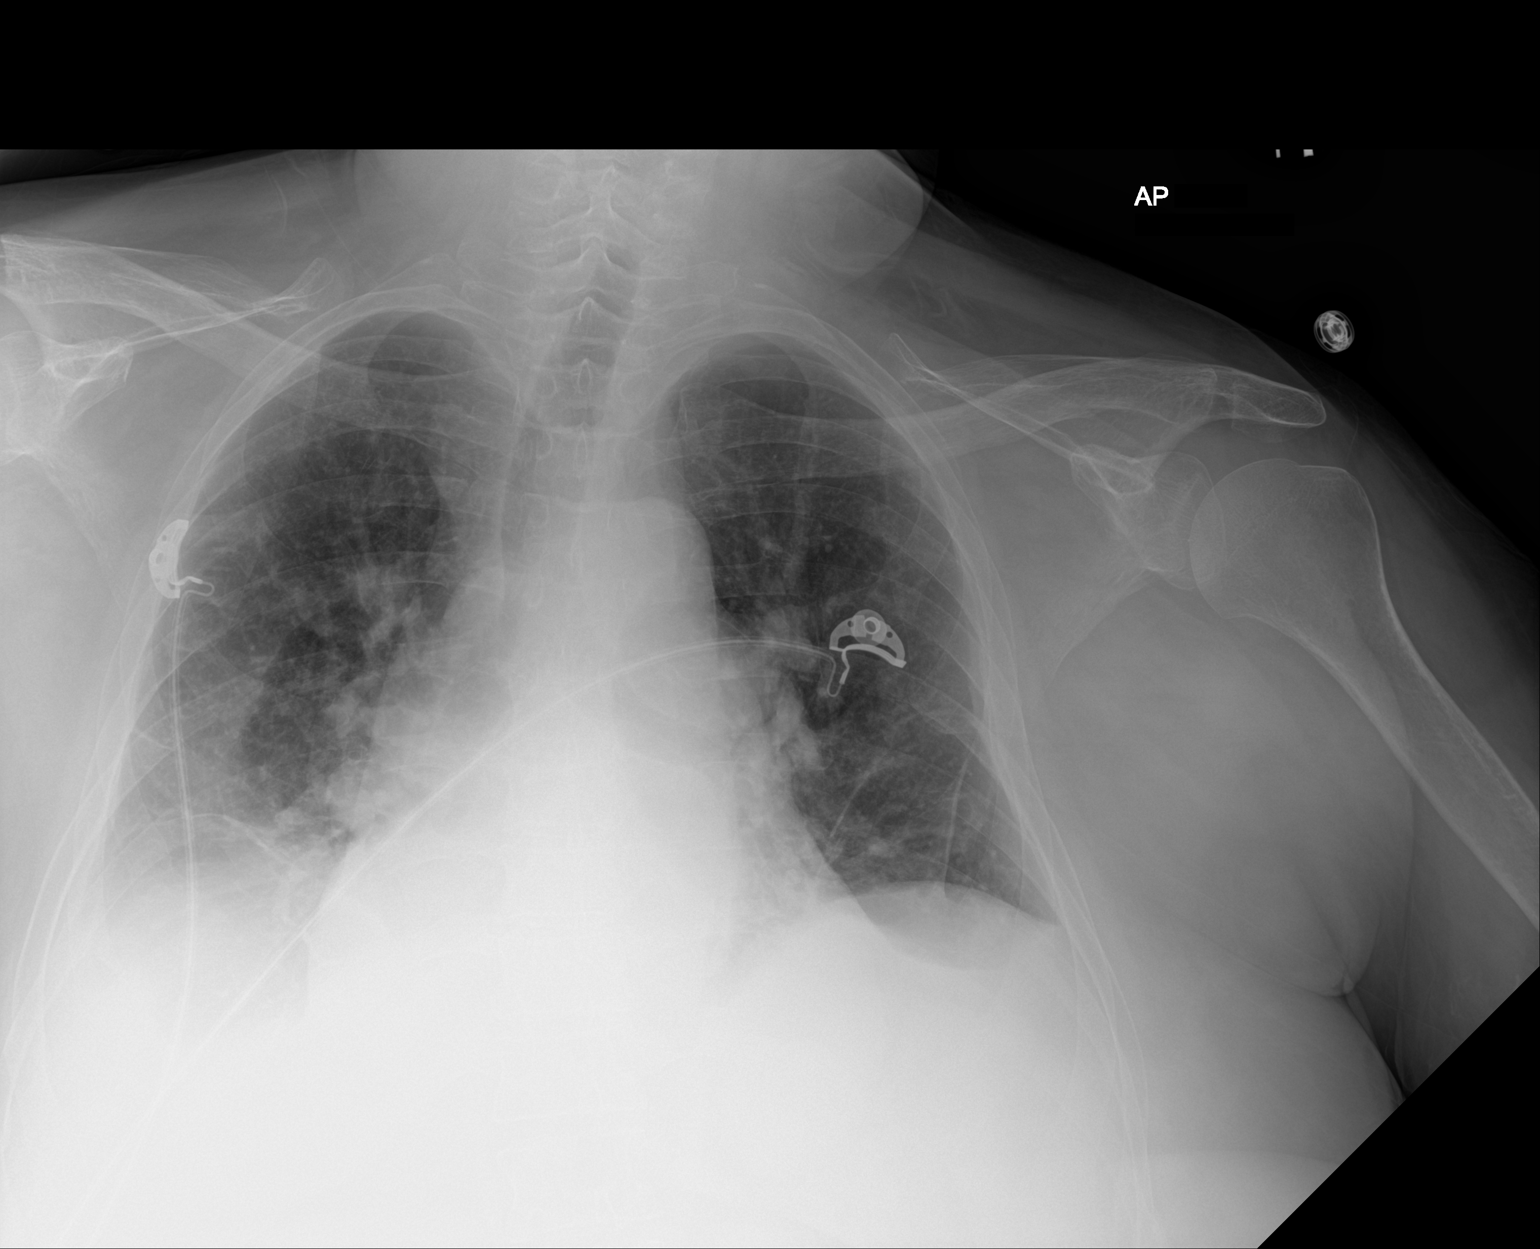

[1 of 1 positions shown; findings below may reference images not displayed]

FINDINGS: The heart size and mediastinal contours are unchanged.
Redemonstration of prominent hilar vasculature.

Left base linear atelectasis. No focal consolidation within the
aerated lungs. No pulmonary edema. Interval increase in trace to
small volume right pleural effusion. Persistent trace left pleural
effusion. No pneumothorax.

No acute osseous abnormality.
IMPRESSION: 1. Slight interval increase of a right trace to small volume pleural
effusion.
2. Grossly stable left trace pleural effusion.
3. Vascular congestion.

## 2022-07-05 IMAGING — CT CT ABD-PELV W/O CM
2 of 4 series · 16 of 46 positions shown, 18 images · non-contrast
Comparison: CT of the abdomen and pelvis without contrast
12/16/2020.

CLINICAL DATA: Increasing abdominal pain. Reason complication of
diverticulitis. Renal transplant.

EXAM:
CT ABDOMEN AND PELVIS WITHOUT CONTRAST
TECHNIQUE: Multidetector CT imaging of the abdomen and pelvis was performed
following the standard protocol without IV contrast.

[Series 2: axial st · axial · 0.91mm/px · z∈[-436,+4]mm · 13 of 100 slices shown, 15 images]
[im 6/100  soft-tissue]
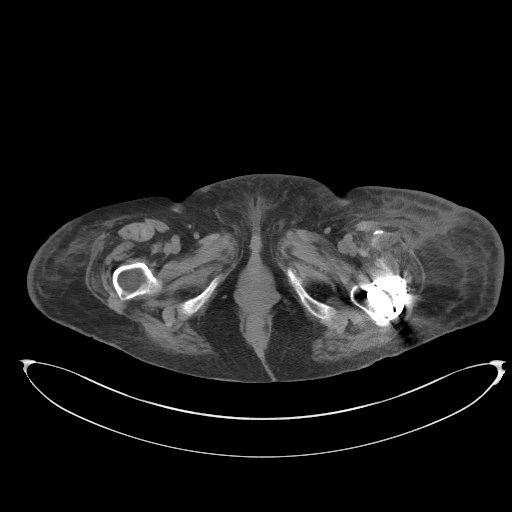
[im 6/100  bone]
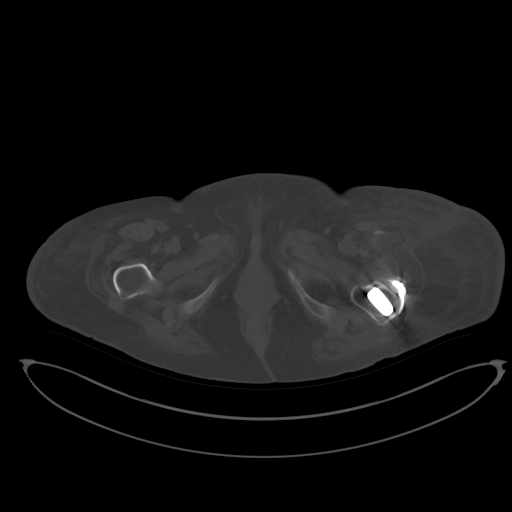
[im 12/100  soft-tissue]
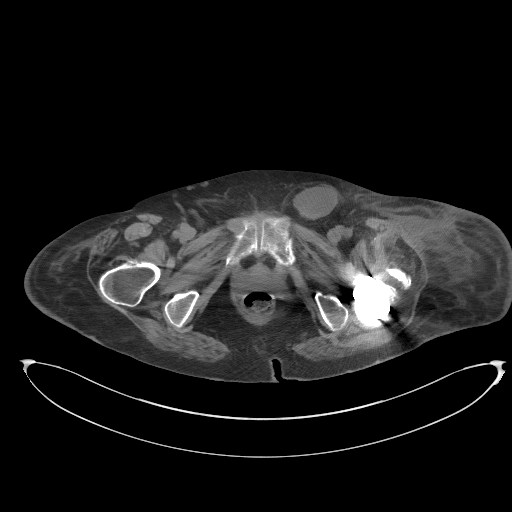
[im 23/100  soft-tissue]
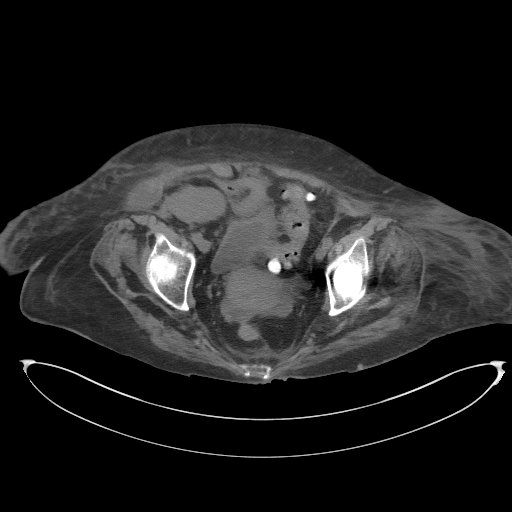
[im 28/100  soft-tissue]
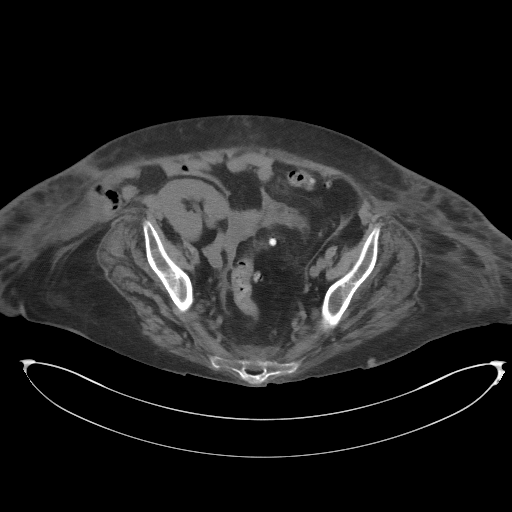
[im 34/100  soft-tissue]
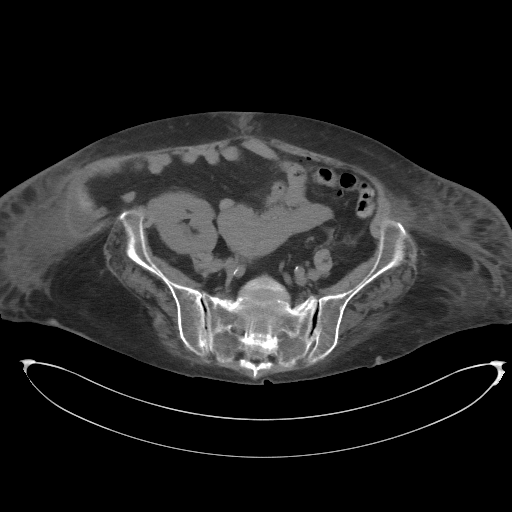
[im 45/100  soft-tissue]
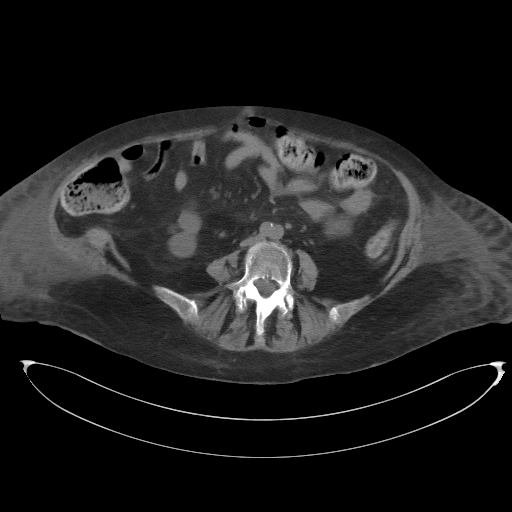
[im 50/100  soft-tissue]
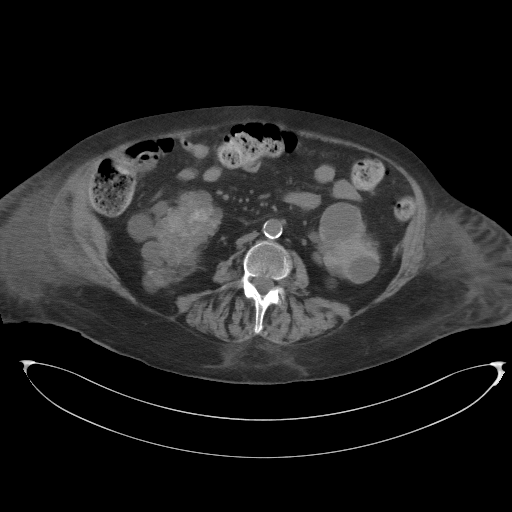
[im 56/100  soft-tissue]
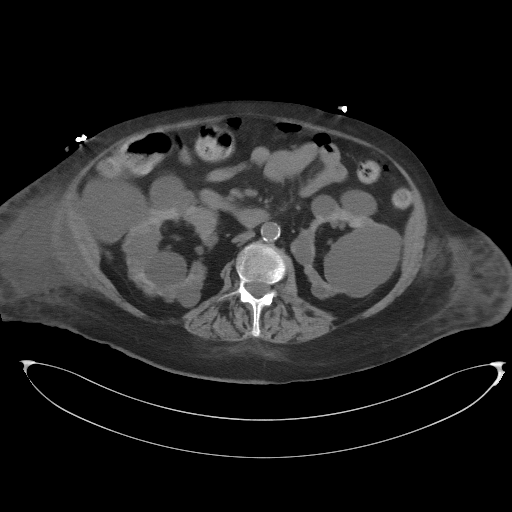
[im 67/100  soft-tissue]
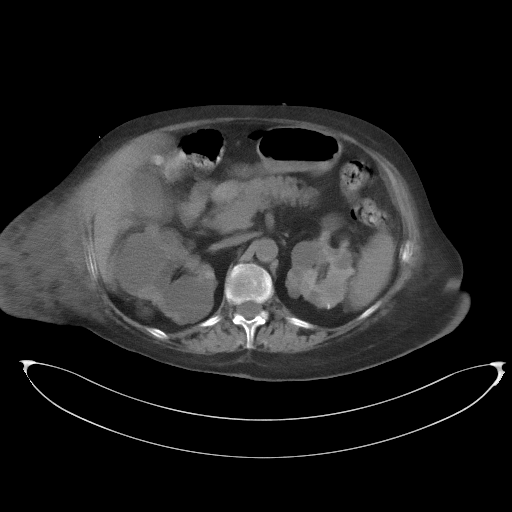
[im 67/100  bone]
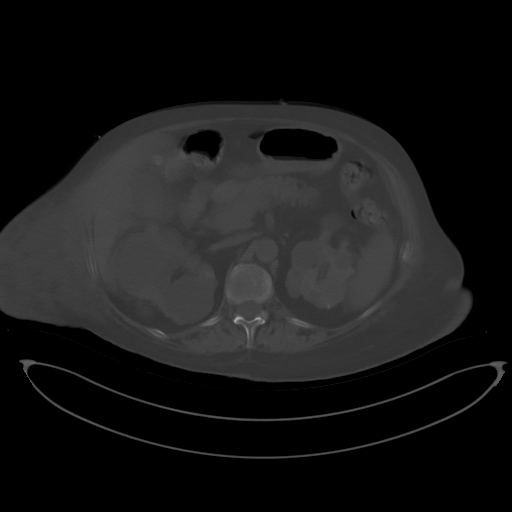
[im 72/100  soft-tissue]
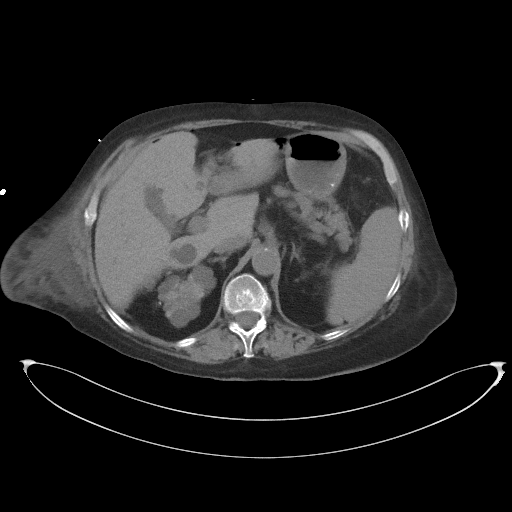
[im 78/100  soft-tissue]
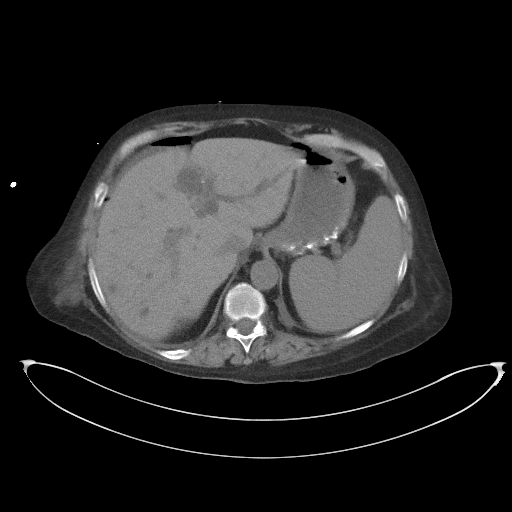
[im 89/100  soft-tissue]
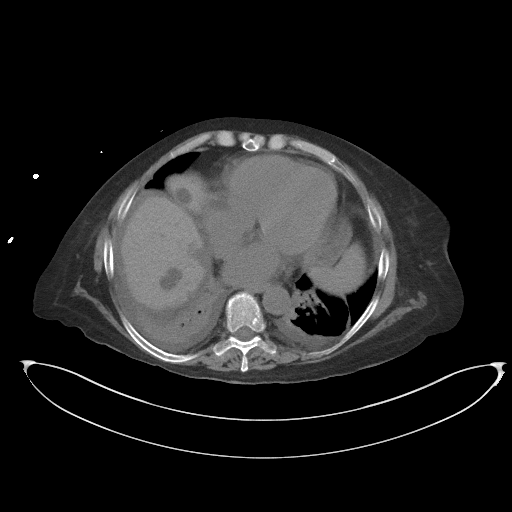
[im 94/100  soft-tissue]
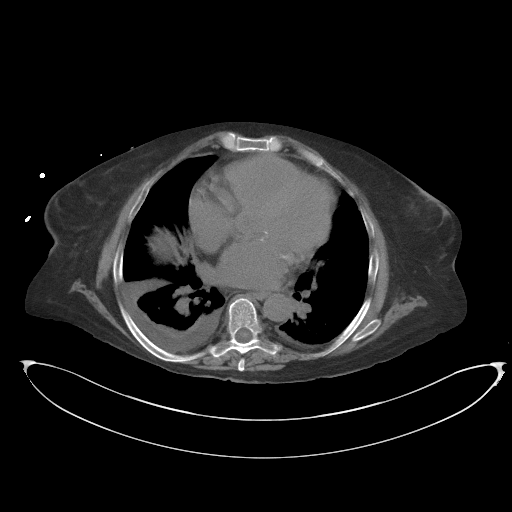

[Series 5: coronal st · coronal · 1.06mm/px · 3 of 101 slices shown]
[im 34/101  soft-tissue]
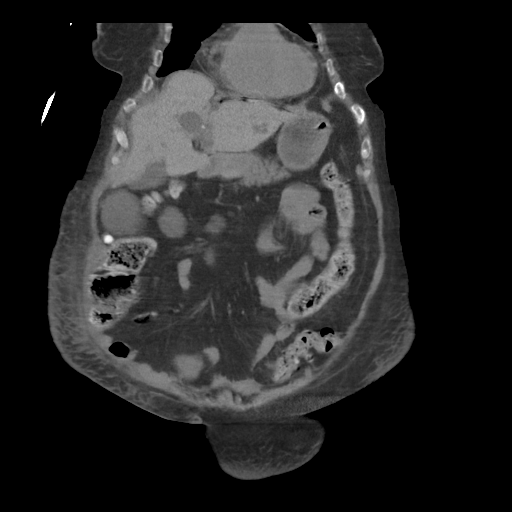
[im 45/101  soft-tissue]
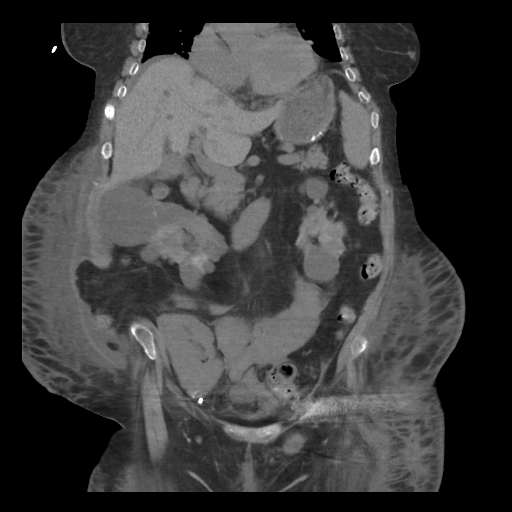
[im 56/101  soft-tissue]
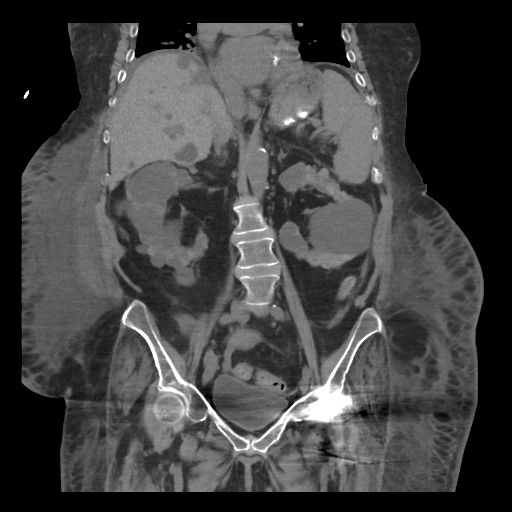

[16 of 46 positions shown; findings below may reference images not displayed]

FINDINGS: Lower chest: Right greater than left pleural effusions are new.
There is partial collapse of the right lower lobe. Mild dependent
atelectasis is present bilaterally. The heart is enlarged. Coronary
artery calcifications are present.

Hepatobiliary: Multiple hepatic cysts are again seen. There is some
fluid under the diaphragm. No new lesions are present. Gallbladder
and common bile duct are within normal limits.

Pancreas: Unremarkable. No pancreatic ductal dilatation or
surrounding inflammatory changes.

Spleen: Stable splenomegaly.  No discrete lesion.

Adrenals/Urinary Tract: Bilateral renal cystic disease is stable. No
new lesions are present. Right lower quadrant renal transplant is
unremarkable. Urinary bladder is within normal limits.

Stomach/Bowel: The stomach and duodenum are within normal limits.
Small bowel is unremarkable. Terminal ileum is within normal limits.
The ascending and transverse colon are unremarkable. Descending
colon is within normal limits. Persistent inflammatory changes are
evident.

New intraperitoneal air is present over the upper abdomen. A fluid
collection is present anterior to the urinary bladder, adjacent to
the inflamed bowel. The collection measures 4.1 x 2.9 x 2.8 cm.

Vascular/Lymphatic: Atherosclerotic calcifications are present in
the abdominal aorta and branch vessels without aneurysm.

Reproductive: Uterus and bilateral adnexa are unremarkable.

Other: Fluid extends into left inguinal hernia. The paraumbilical
hernia is again noted. There is gas within the hernia.

Musculoskeletal: Advanced degenerative changes are present in the
lumbar spine. Degenerative changes are present in the SI joints
bilaterally. Left total hip arthroplasty noted. Remote left pubic
symphysis fractures noted.
IMPRESSION: 1. Pneumoperitoneum compatible with bowel perforation.
2. New fluid collection anterior to the urinary bladder, adjacent to
the inflamed bowel. This is concerning for a developing abscess
associated with the sigmoid diverticulitis.
3. Persistent inflammatory changes the sigmoid colon compatible with
diverticulitis.
4. New bilateral pleural effusions, right greater than left.
5. Cardiomegaly without failure.
6. Coronary artery disease.
7. Stable splenomegaly.
8. Aortic Atherosclerosis ([id number]-X06.6).

## 2022-07-07 IMAGING — DX DG CHEST 1V PORT
1 series · 1 of 1 positions shown · non-contrast
Comparison: 12/29/2020

CLINICAL DATA: Respiratory failure.

EXAM:
PORTABLE CHEST 1 VIEW

[chest ap]
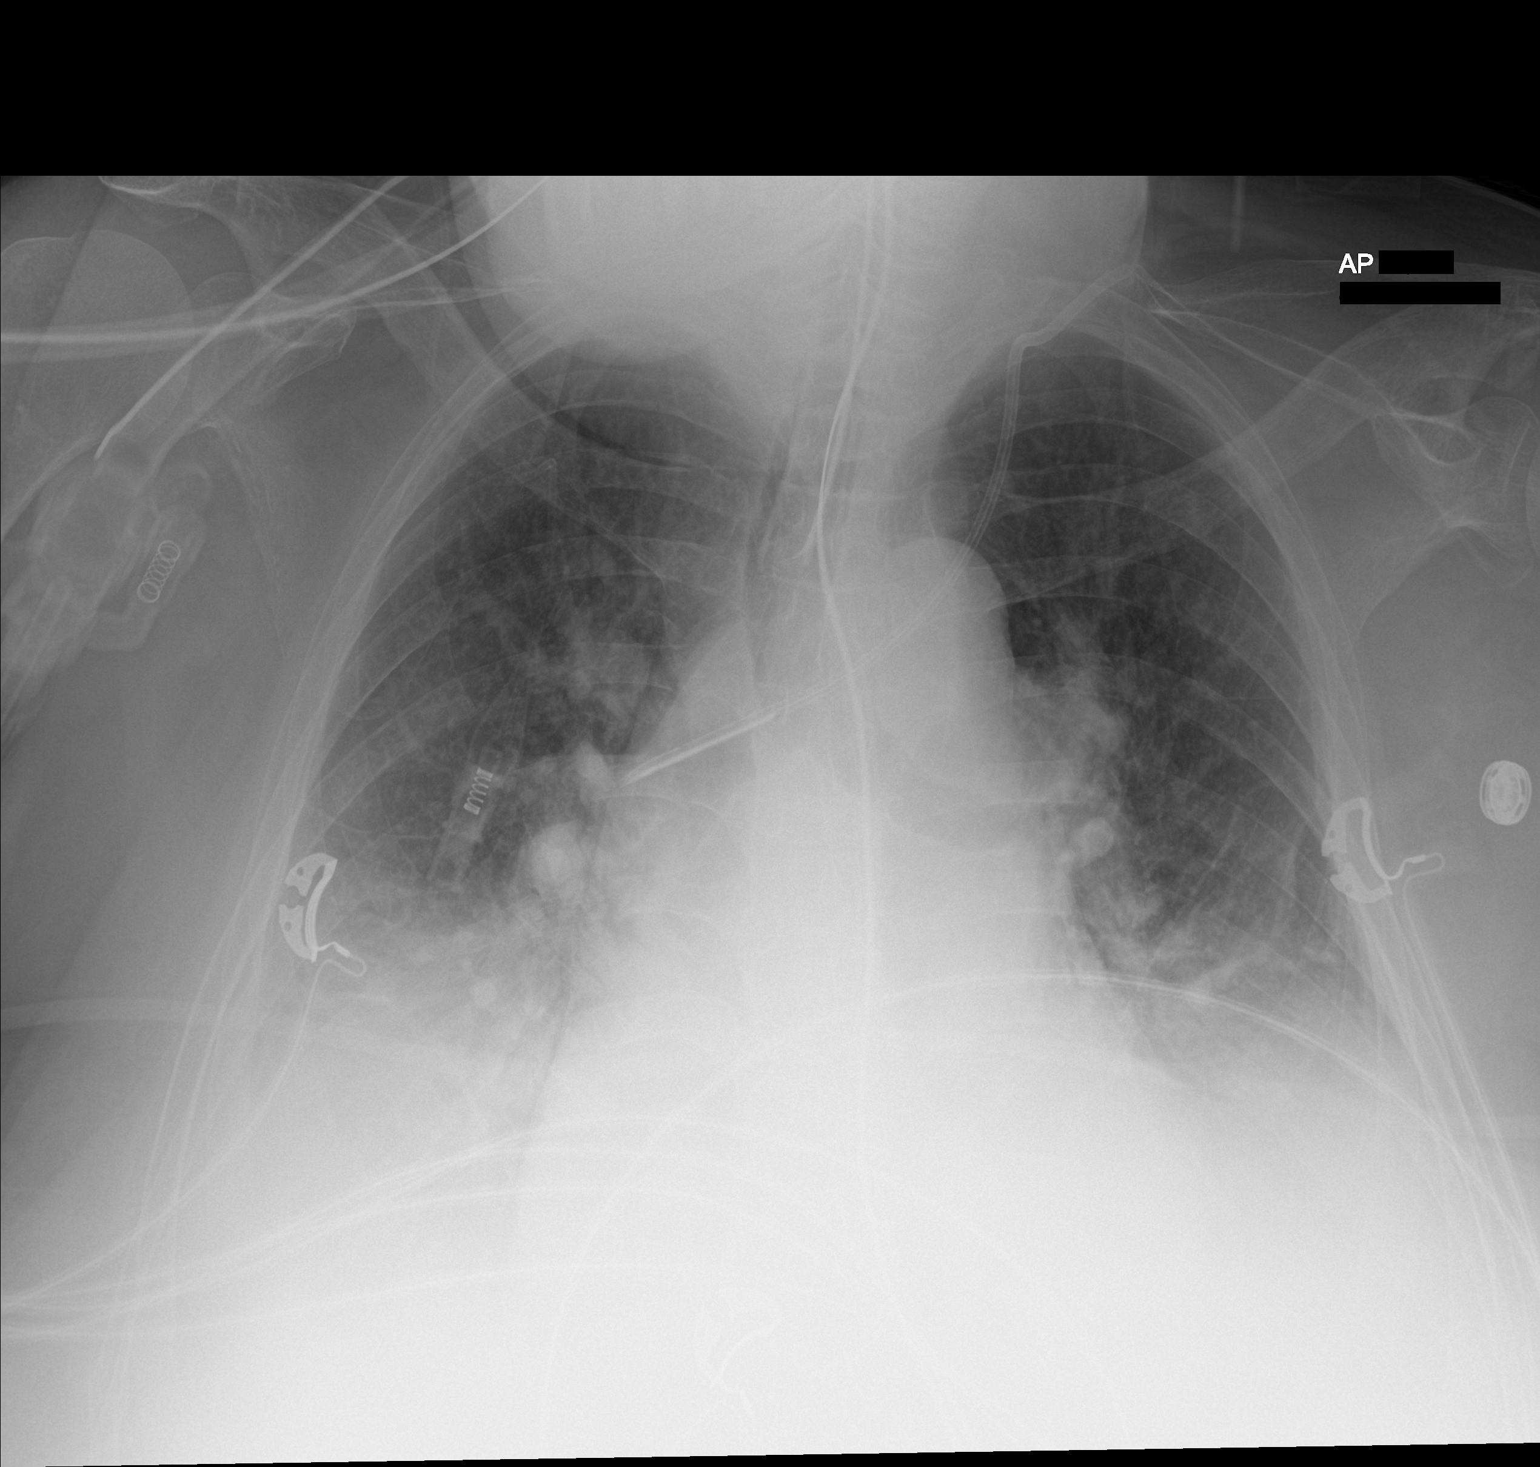

[1 of 1 positions shown; findings below may reference images not displayed]

FINDINGS: u 3855 hours. Endotracheal tube tip is 4.4 cm above the base of the
carina. The NG tube passes into the stomach although the distal tip
position is not included on the film. Left IJ central line tip
overlies the innominate vein confluence, stable. The cardio
pericardial silhouette is enlarged. There is pulmonary vascular
congestion without overt pulmonary edema. Bibasilar
atelectasis/infiltrate again noted with probable small bilateral
pleural effusions.
IMPRESSION: Slight progression of bibasilar atelectasis/infiltrate with small
layering pleural effusions visible on the current study.

Cardiomegaly with vascular congestion.

## 2022-07-08 IMAGING — DX DG CHEST 1V PORT
1 series · 1 of 1 positions shown · non-contrast
Comparison: Portable chest 12/31/2020 and earlier.

CLINICAL DATA: 64-year-old female with respiratory failure. Renal
transplant. Recent suspected bowel perforation on CT Abdomen and
Pelvis.

EXAM:
PORTABLE CHEST 1 VIEW

[chest ap]
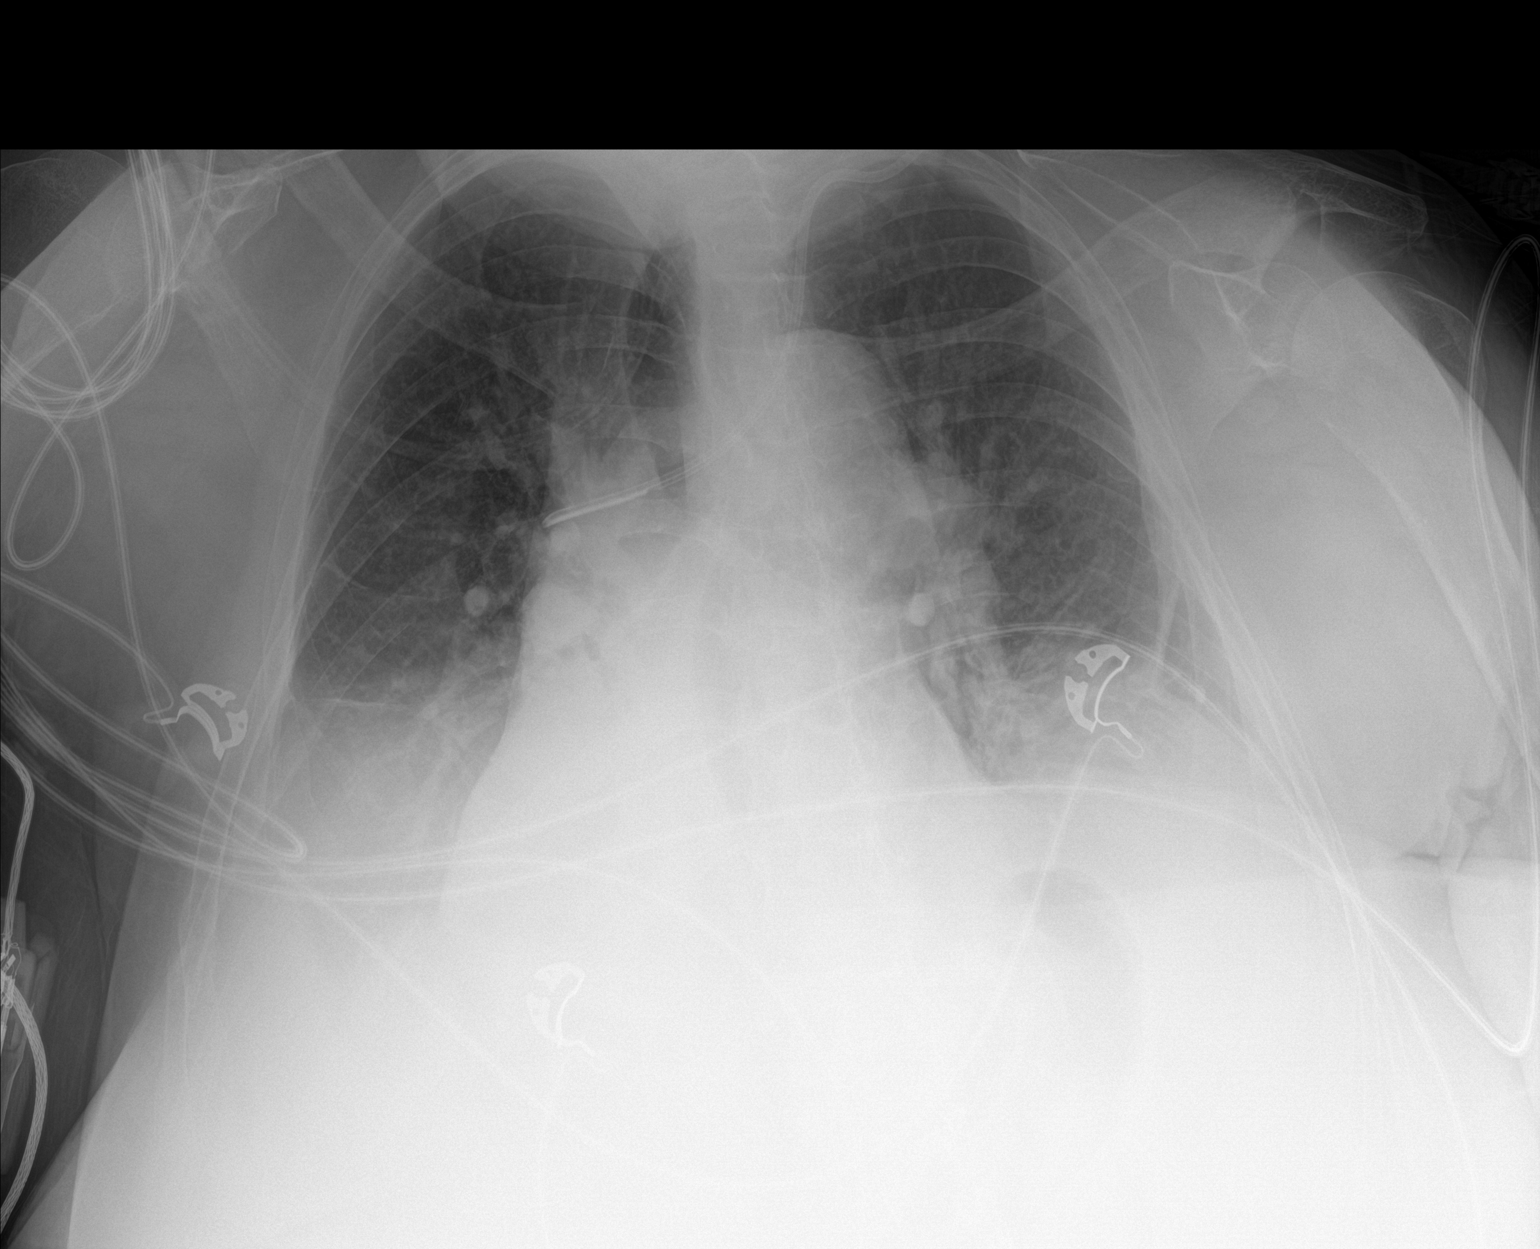

[1 of 1 positions shown; findings below may reference images not displayed]

FINDINGS: Portable AP semi upright view at 5620 hours. Extubated. Enteric tube
removed. Stable left IJ central line. The patient is slightly more
rotated to the right. Stable cardiac size and mediastinal contours.
Veiling bilateral pulmonary opacity, suspicious for increased
pleural effusions since the CT on 12/29/2020. Associated dense lung
base opacity. No pneumothorax. Upper lung pulmonary vascularity
appears normal. Paucity of bowel gas in the upper abdomen.
IMPRESSION: 1. Extubated and enteric tube removed. Stable left IJ central line.
2. Veiling and confluent bilateral lung base opacity suspicious for
increased pleural effusions and lower lobe collapse or consolidation
since the abdomen CT on 12/29/2020.
[DATE]. No overt pulmonary edema.

## 2022-07-19 IMAGING — DX DG ELBOW 2V*R*
2 series · 2 of 2 positions shown · non-contrast
Comparison: None.

CLINICAL DATA: Right elbow pain.

EXAM:
RIGHT ELBOW - 2 VIEW

[elbow ap]
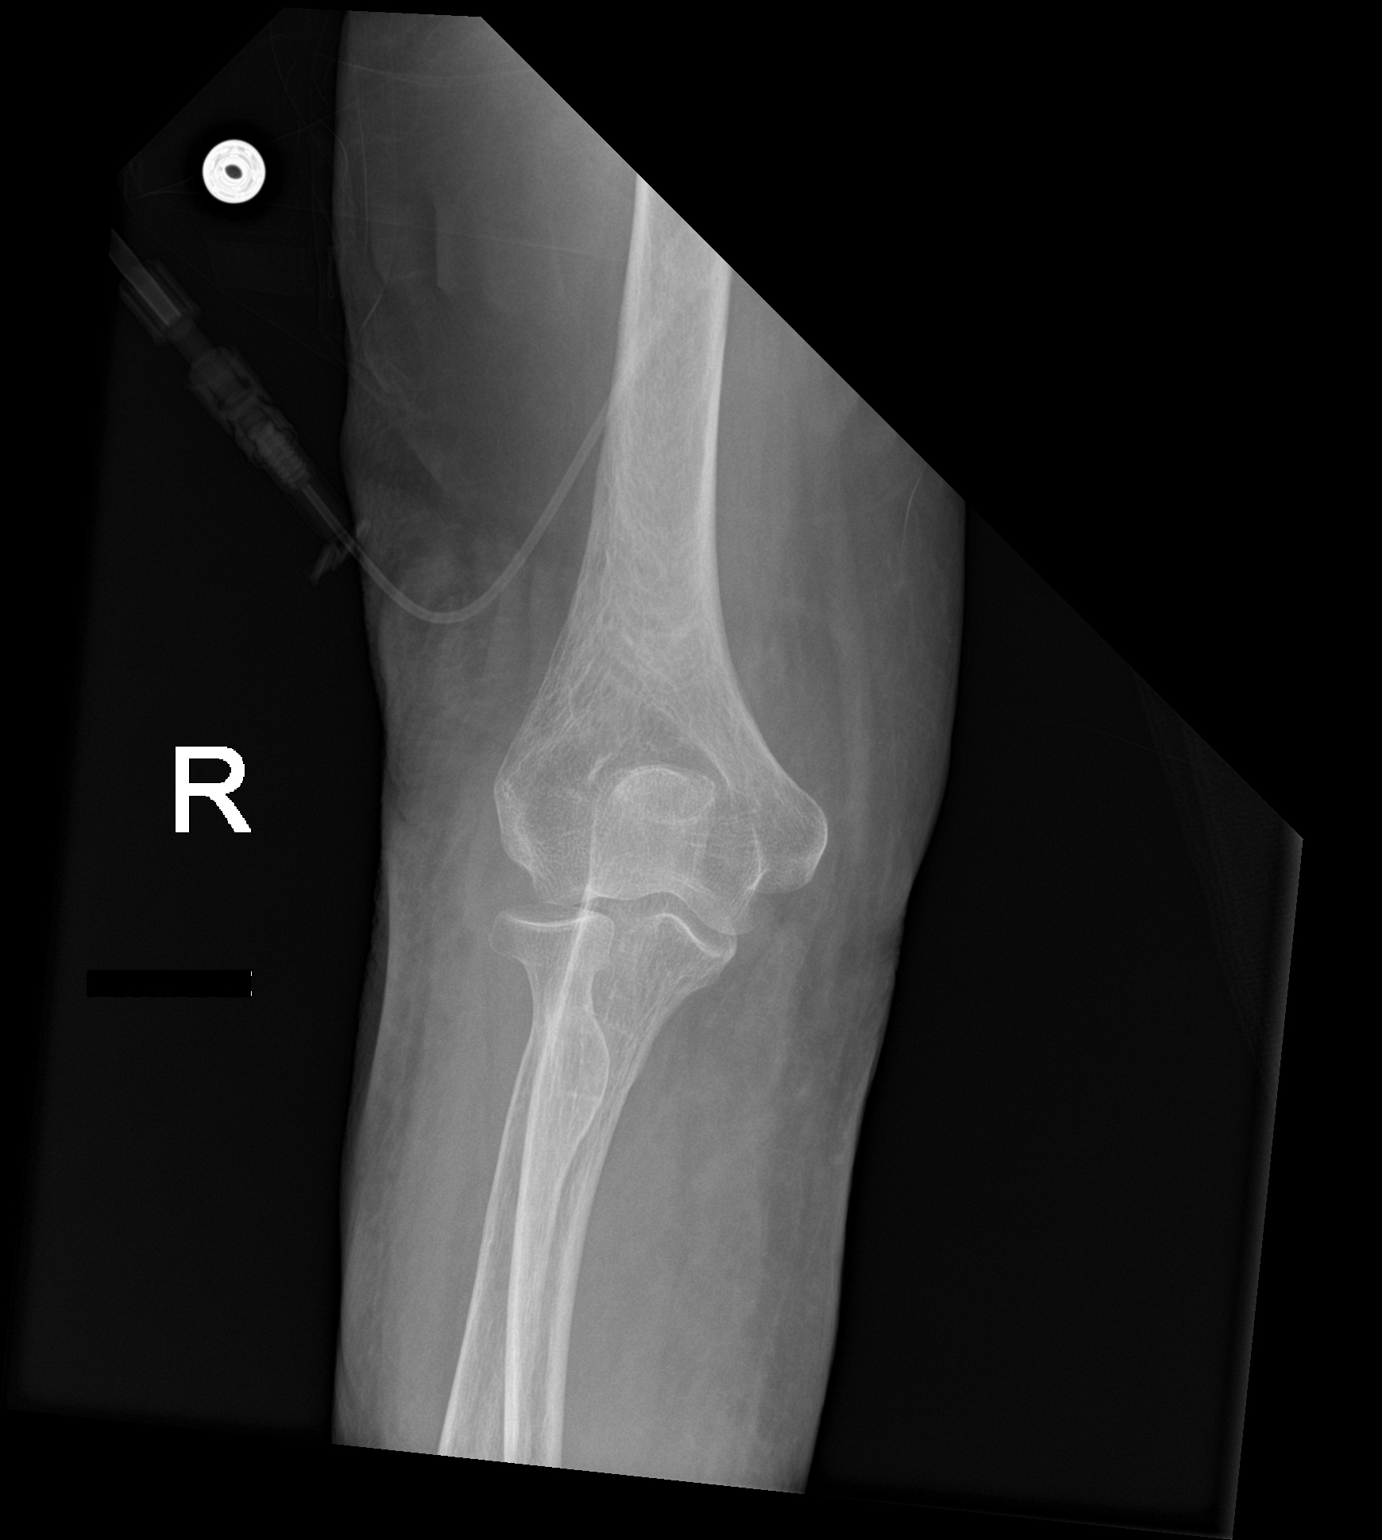

[elbow lat]
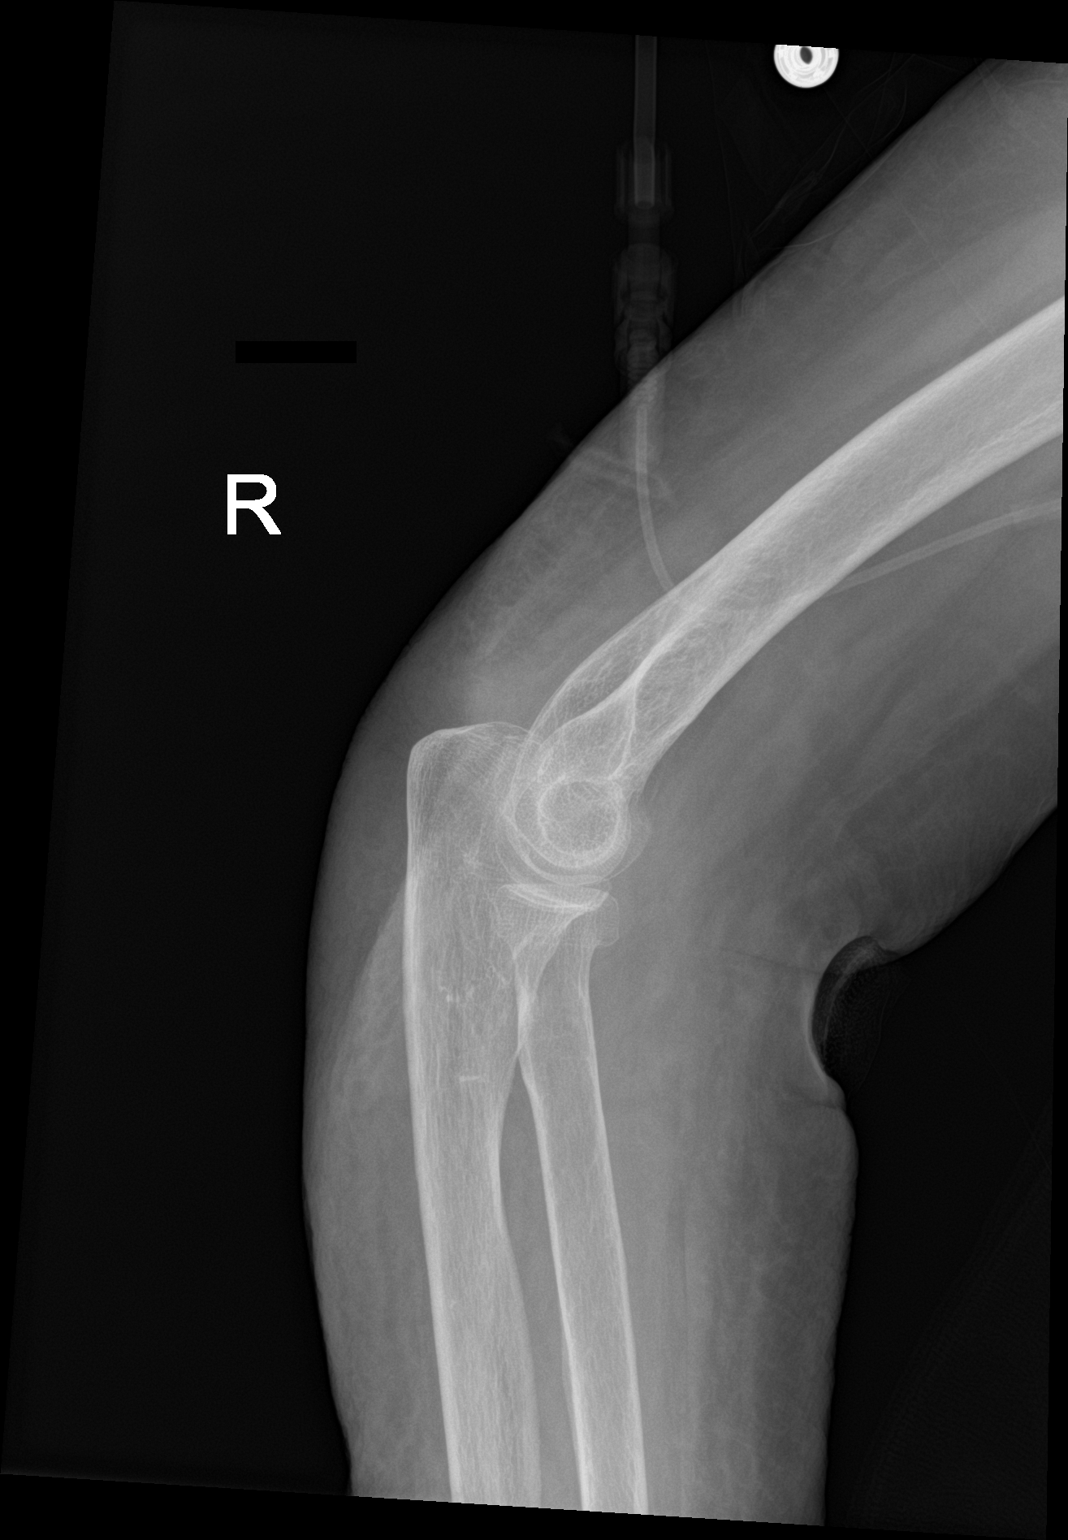

[2 of 2 positions shown; findings below may reference images not displayed]

FINDINGS: No gross fracture or dislocation on this portable study. Exam is
limited by positioning and assessment for joint effusion is limited.
No worrisome lytic or sclerotic osseous abnormality.
IMPRESSION: Limited study due to positioning. No gross fracture or dislocation.
Nondisplaced radial neck fracture a concern. Repeat dedicated elbow
films recommended when the patient is better able to participate
with positioning.

## 2022-07-27 IMAGING — CT CT CHEST W/O CM
2 of 4 series · 13 of 36 positions shown, 16 images · non-contrast
Comparison: 05/14/2020, 12/29/2020

CLINICAL DATA: Respiratory failure, history of kidney transplant,
recent abdominal surgery with drainage

EXAM:
CT CHEST, ABDOMEN AND PELVIS WITHOUT CONTRAST
TECHNIQUE: Multidetector CT imaging of the chest, abdomen and pelvis was
performed following the standard protocol without IV contrast.
Unenhanced CT was performed per clinician order. Lack of IV contrast
limits sensitivity and specificity, especially for evaluation of
abdominal/pelvic solid viscera.

[Series 2: cap w/o · axial · non-contrast · 0.74mm/px · z∈[+1210,+1705]mm · 10 of 117 slices shown, 13 images]
[im 9/117  mediastinal]
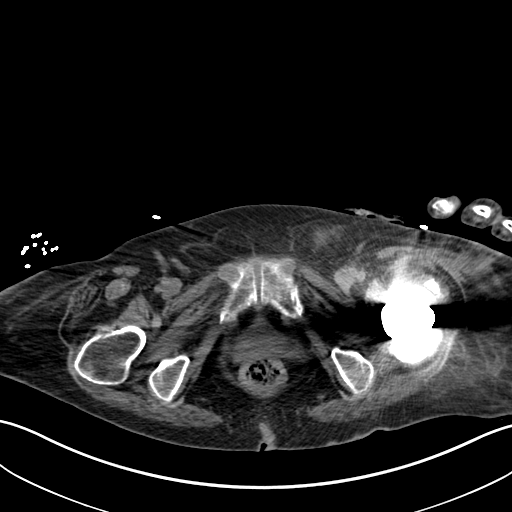
[im 9/117  lung]
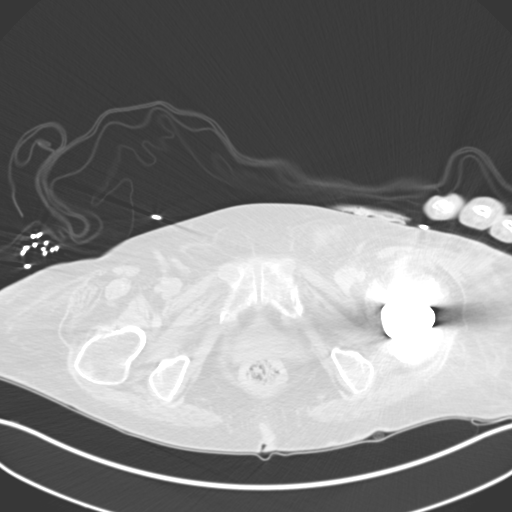
[im 18/117  lung]
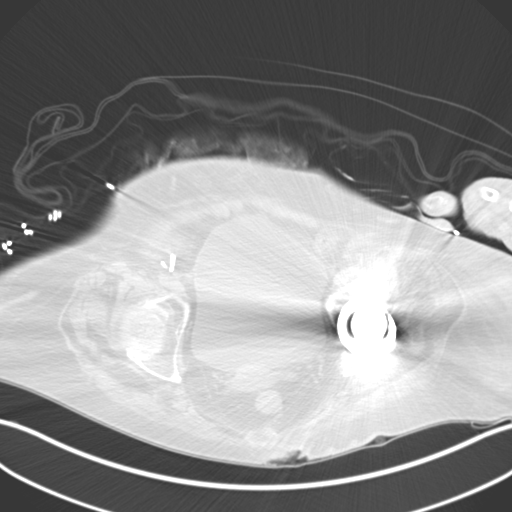
[im 36/117  lung]
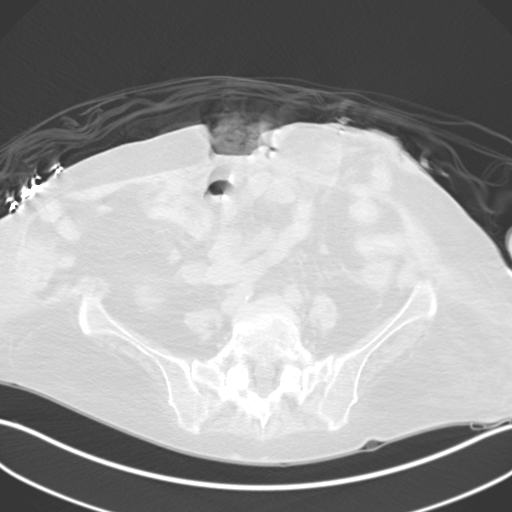
[im 45/117  lung]
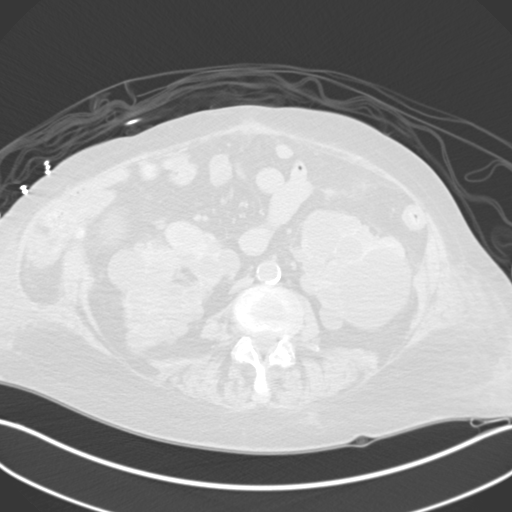
[im 54/117  mediastinal]
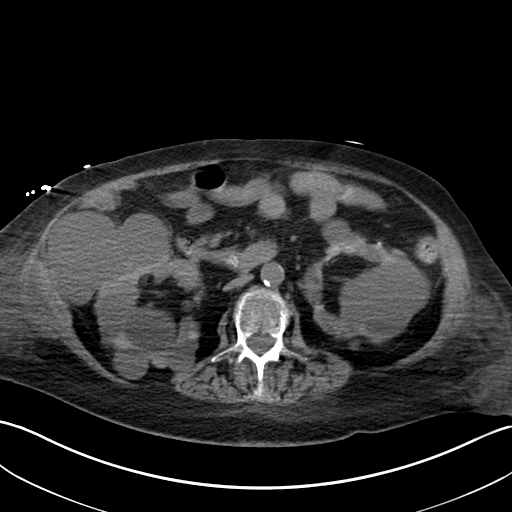
[im 54/117  lung]
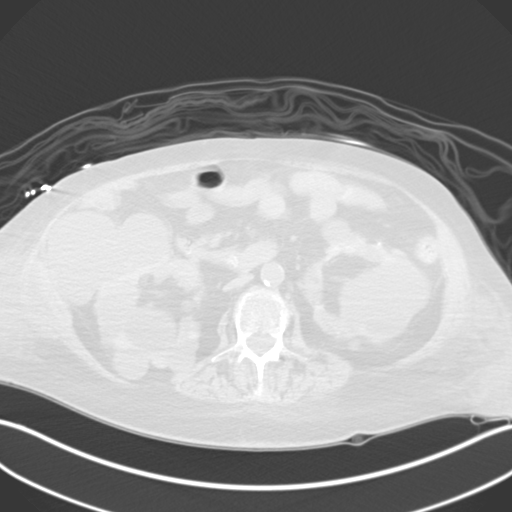
[im 63/117  lung]
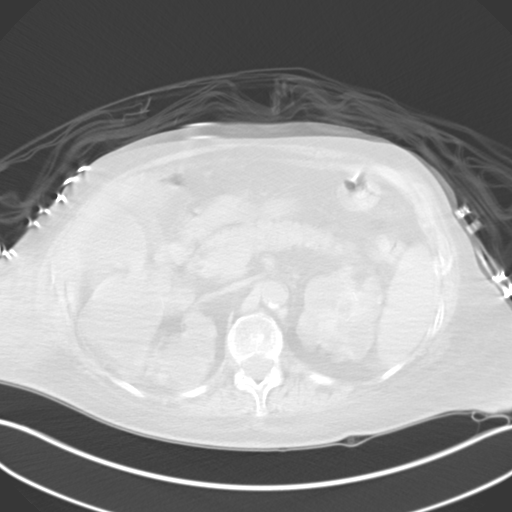
[im 72/117  lung]
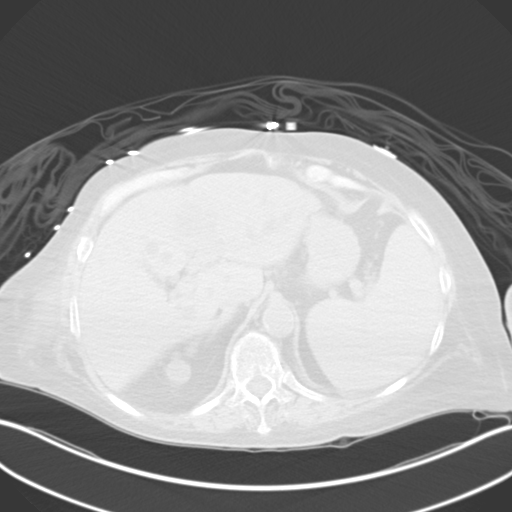
[im 90/117  lung]
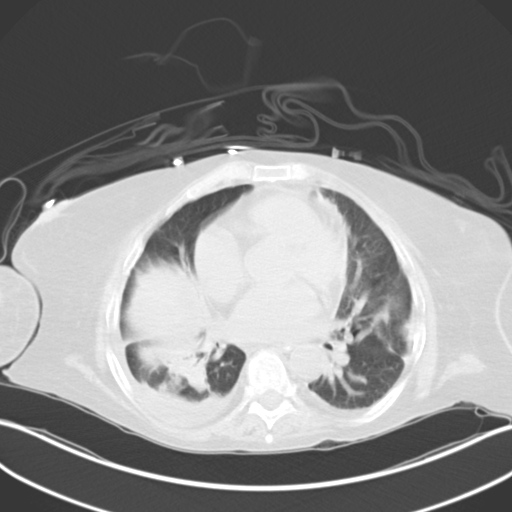
[im 99/117  mediastinal]
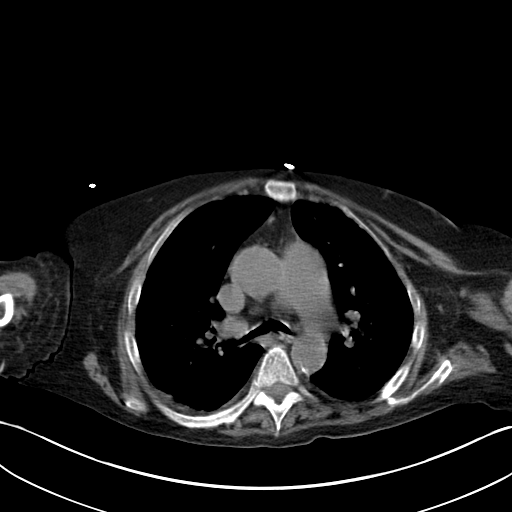
[im 99/117  lung]
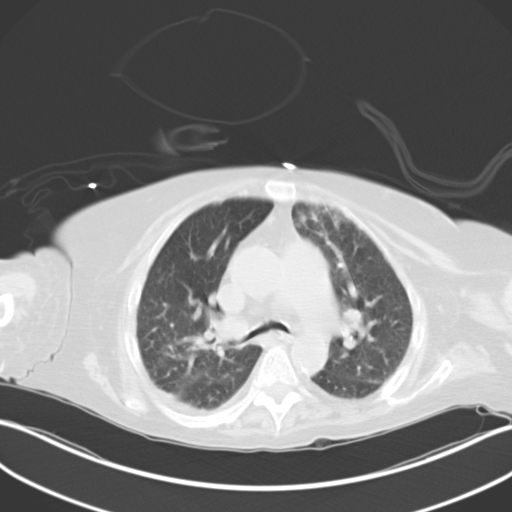
[im 108/117  lung]
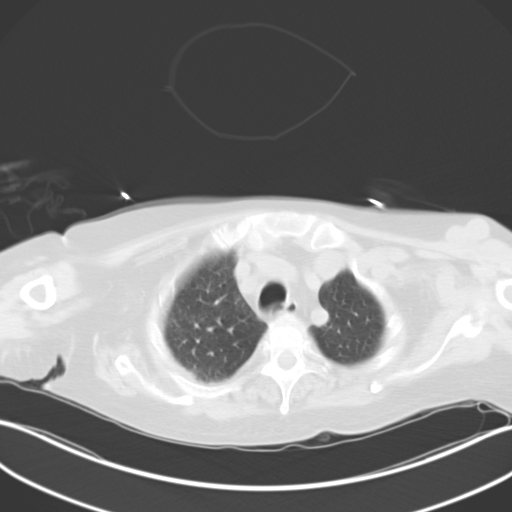

[Series 3: coronals · coronal · 0.91mm/px · 3 of 129 slices shown]
[im 26/129  lung]
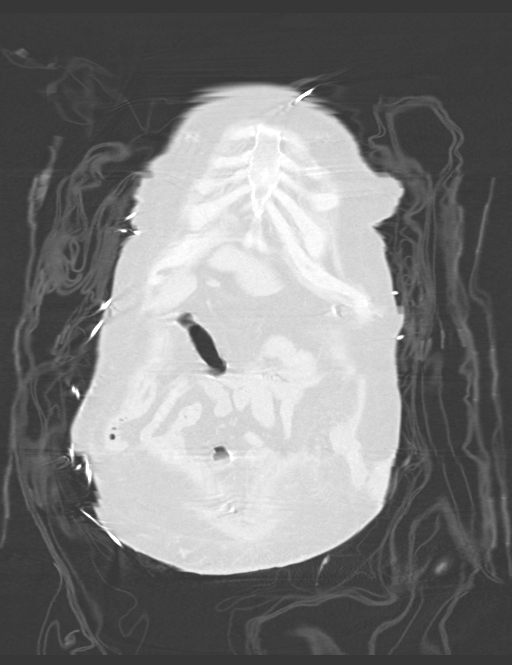
[im 52/129  lung]
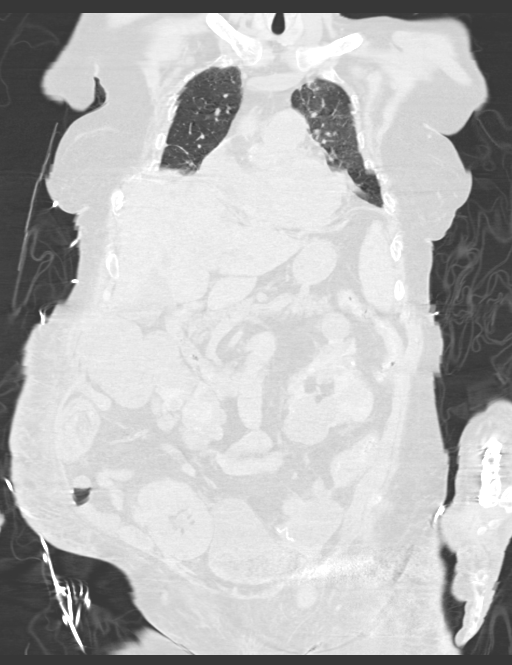
[im 77/129  lung]
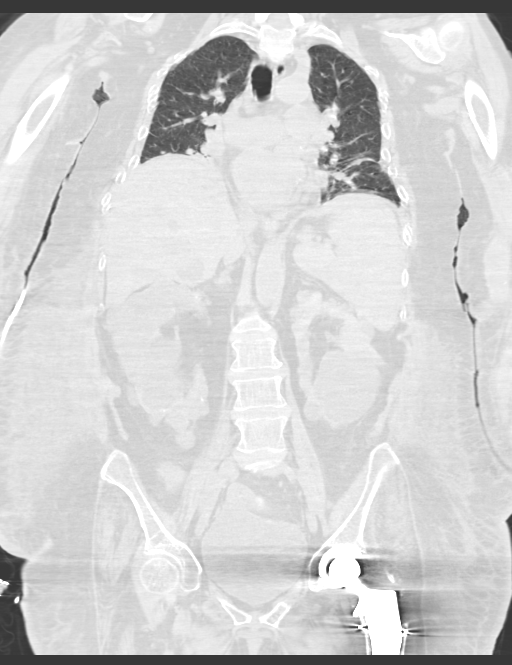

[13 of 36 positions shown; findings below may reference images not displayed]

FINDINGS: CT CHEST FINDINGS

Cardiovascular: Unenhanced imaging of the heart and great vessels
demonstrates no pericardial effusion. Normal caliber of the thoracic
aorta. Minimal atherosclerosis.

Incidental note is made of prominent vascular structures in the left
upper extremity consistent with previous AV fistula compatible with
history of renal failure.

Mediastinum/Nodes: No enlarged mediastinal, hilar, or axillary lymph
nodes. Thyroid gland, trachea, and esophagus demonstrate no
significant findings.

Lungs/Pleura: There are small bilateral pleural effusions, right
greater than left. Bilateral lower lobe atelectasis greatest in the
right lower lobe. Patchy lingular consolidation may reflect
hypoventilatory change versus early infection. No pneumothorax.
Central airways are patent.

Musculoskeletal: Prior healed bilateral rib fractures. No acute or
destructive bony lesions. Reconstructed images demonstrate no
additional findings.

CT ABDOMEN PELVIS FINDINGS

Hepatobiliary: Multiple hepatic cysts are again noted. Otherwise
unremarkable unenhanced evaluation of the liver. Small gallstones
are identified within the gallbladder with no evidence of acute
cholecystitis.

Pancreas: Unremarkable. No pancreatic ductal dilatation or
surrounding inflammatory changes.

Spleen: Mild splenomegaly unchanged.  No focal abnormalities.

Adrenals/Urinary Tract: Numerous cysts are seen throughout the
native kidneys, unchanged. Right lower quadrant renal transplant is
unremarkable. No urinary tract calculi or obstructive uropathy.
Bladder is unremarkable without filling defect. The adrenals are
normal.

Stomach/Bowel: No bowel obstruction or ileus. Diverting colostomy
left lower quadrant is noted. Minimal diverticulosis of the
remaining sigmoid colon. There is a fluid collection in the left
lower quadrant measuring 3.3 x 2.8 cm on image 92/7, and extending
approximately 5.3 cm in craniocaudal direction image 127/4. Fluid
collection abuts the enterotomy staple line in the left lower
quadrant, and could reflect postoperative seroma or abscess.
Evaluation is limited without IV and oral contrast.

No bowel wall thickening.  Normal appendix right lower quadrant.

Vascular/Lymphatic: Aortic atherosclerosis. No enlarged abdominal or
pelvic lymph nodes.

Reproductive: Uterus and bilateral adnexa are unremarkable.

Other: Postsurgical changes are seen from midline laparotomy, with
residual surgical defect and packing. No subcutaneous fluid
collection.

There is a left inguinal hernia containing a small amount of fluid,
though decreased since prior study. No evidence of bowel herniation.

Aside from the left lower quadrant fluid collection described above,
no additional areas of intra-abdominal fluid or free gas.

Musculoskeletal: Postsurgical changes from left hip arthroplasty.
There are no acute or destructive bony lesions. Reconstructed images
demonstrate no additional findings.
IMPRESSION: 1. Fluid collection left lower quadrant abutting the staple line of
the sigmoid colon. Findings could reflect postoperative seroma or
abscess. Evaluation limited without IV and oral contrast.
2. Small bilateral pleural effusions and bilateral lower lobe
atelectasis, right greater than left.
3. Patchy non dependent consolidation within the lingula, which
could reflect hypoventilatory change or early infection.
4. Sequela of end-stage renal disease, with unremarkable transplant
kidney right lower quadrant.
5. Postsurgical changes from midline laparotomy and left lower
quadrant colostomy. Surgical defect from the midline laparotomy
without fluid collection or abdominal wall abscess.
6. Stable splenomegaly.
7.  Aortic Atherosclerosis ([id number]).

## 2022-07-27 IMAGING — DX DG CHEST 1V PORT
1 series · 1 of 1 positions shown · non-contrast
Comparison: 01/01/2021

CLINICAL DATA: Hypoxia

EXAM:
PORTABLE CHEST 1 VIEW

[chest ap]
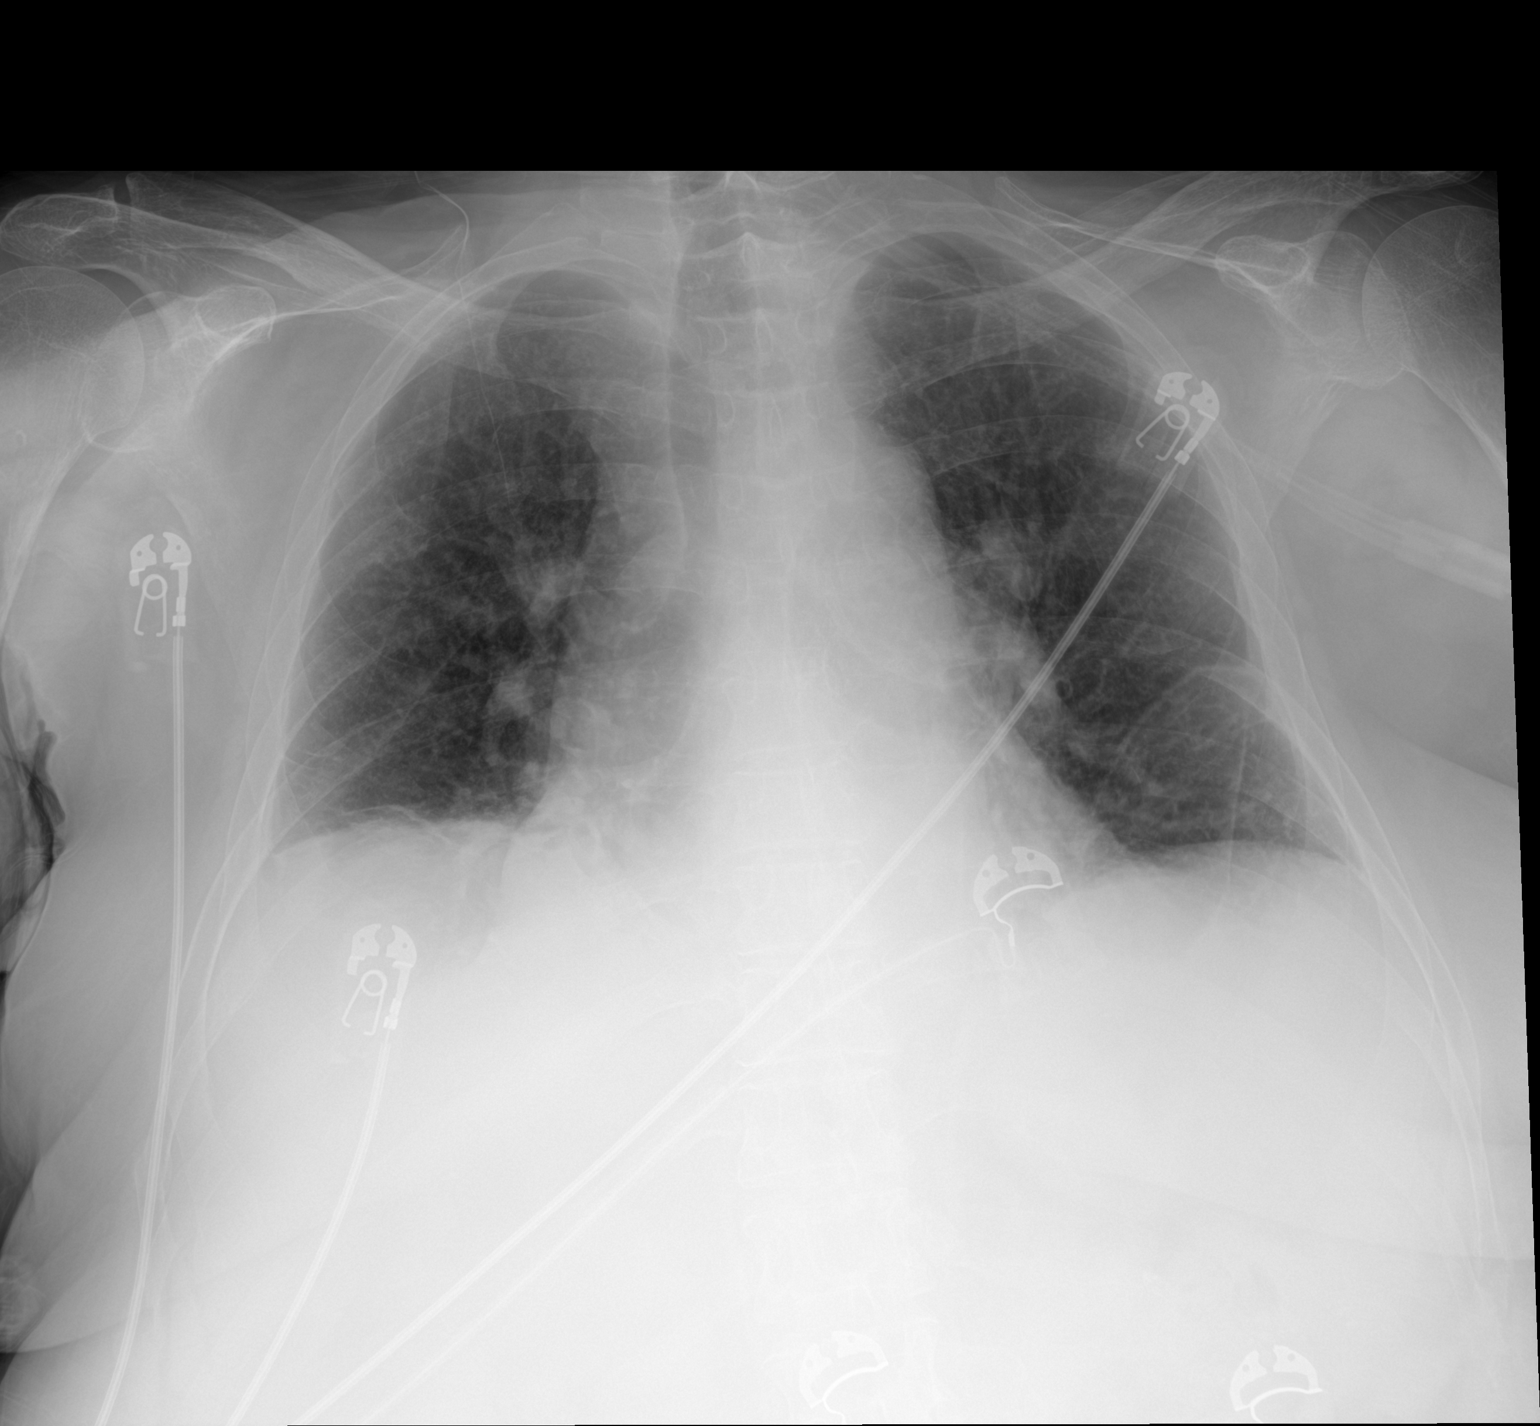

[1 of 1 positions shown; findings below may reference images not displayed]

FINDINGS: Central line is been removed since the prior study. Improved
aeration in the bases with residual mild bibasilar airspace disease
likely atelectasis. Negative for edema or effusion.
IMPRESSION: Mild bibasilar atelectasis

## 2022-07-31 IMAGING — CT CT IMAGE GUIDED DRAINAGE BY PERCUTANEOUS CATHETER
2 of 4 series · 12 of 32 positions shown, 18 images · non-contrast
Comparison: none

INDICATION: 64-year-old woman with left lower quadrant abscess presents to
interventional radiology for CT-guided drain placement.
TECHNIQUE: Informed written consent was obtained from the patient after a
thorough discussion of the procedural risks, benefits and
alternatives. All questions were addressed. Maximal Sterile Barrier
Technique was utilized including caps, mask, sterile gowns, sterile
gloves, sterile drape, hand hygiene and skin antiseptic. A timeout
was performed prior to the initiation of the procedure.

[Series 2: i-spiral 5.0 bf37 · axial · 0.98mm/px · z∈[+1157,+1283]mm · 10 of 45 slices shown, 16 images (1 of 2)]
[im 5/45  soft-tissue]
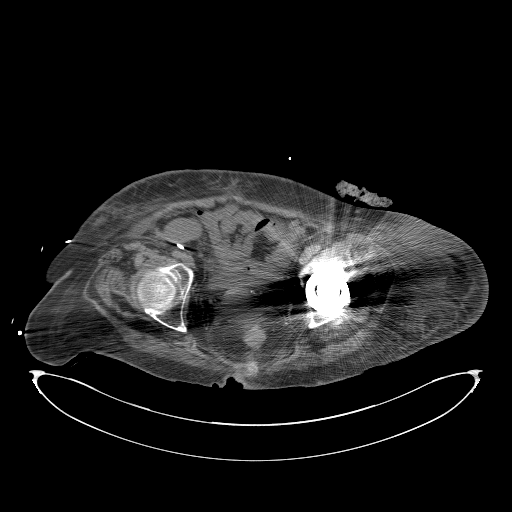
[im 5/45  bone]
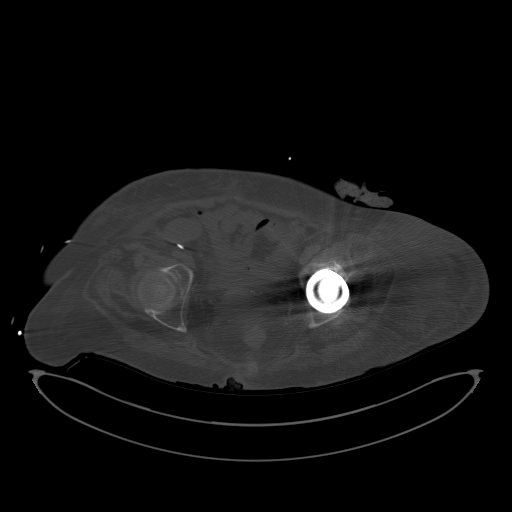
[im 9/45  soft-tissue]
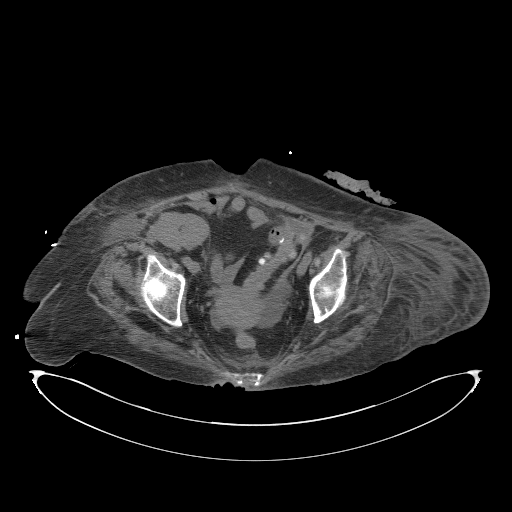
[im 13/45  soft-tissue]
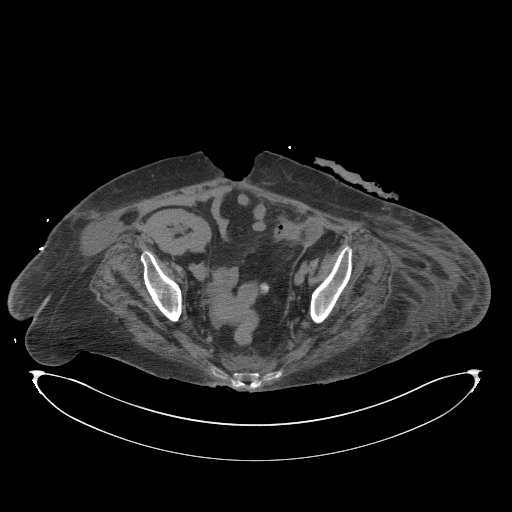
[im 17/45  soft-tissue]
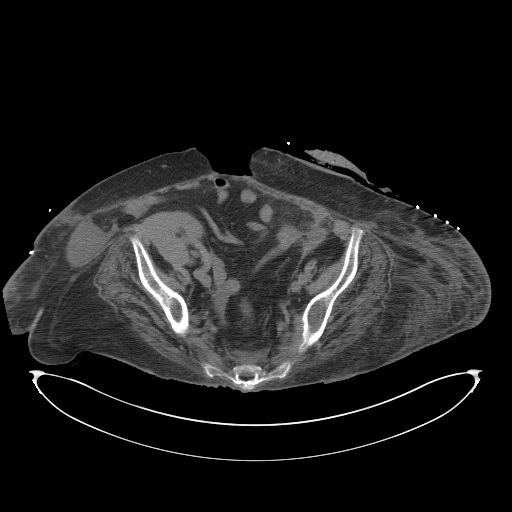
[im 21/45  soft-tissue]
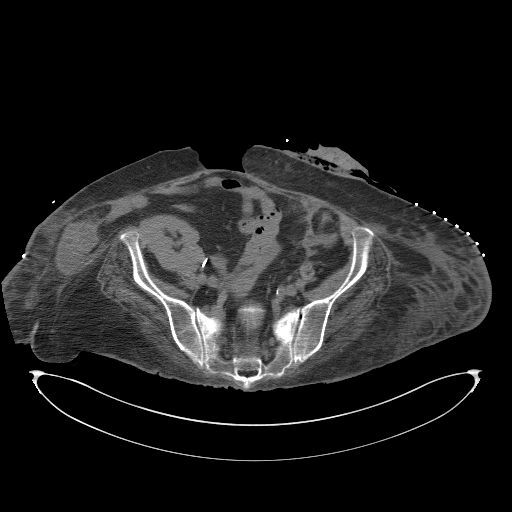
[im 25/45  soft-tissue]
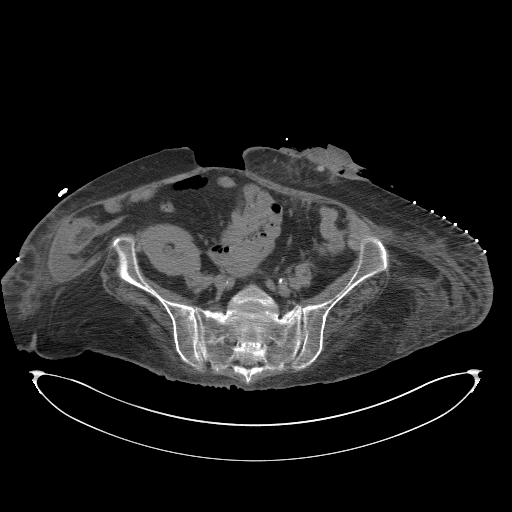
[im 29/45  soft-tissue]
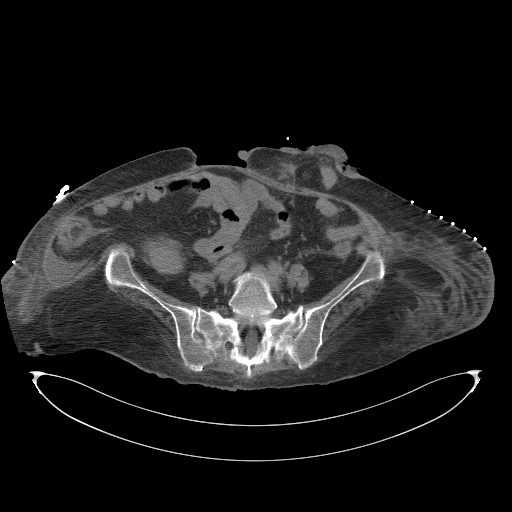
[im 29/45  lung]
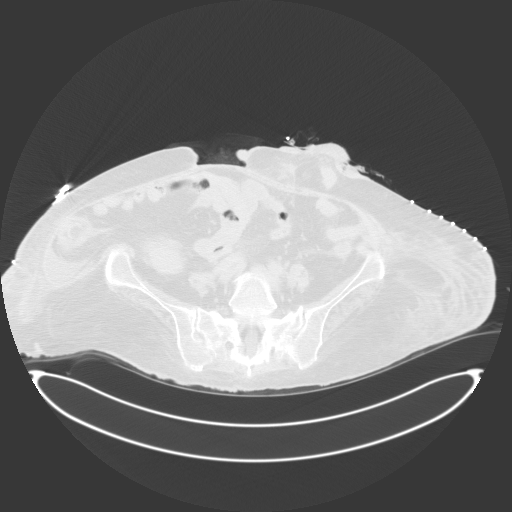
[im 33/45  soft-tissue]
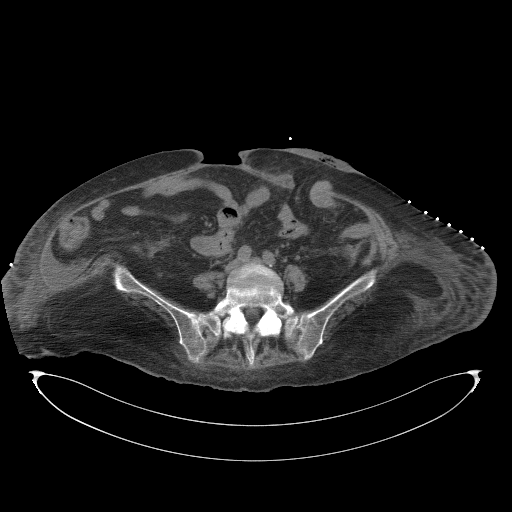
[im 33/45  lung]
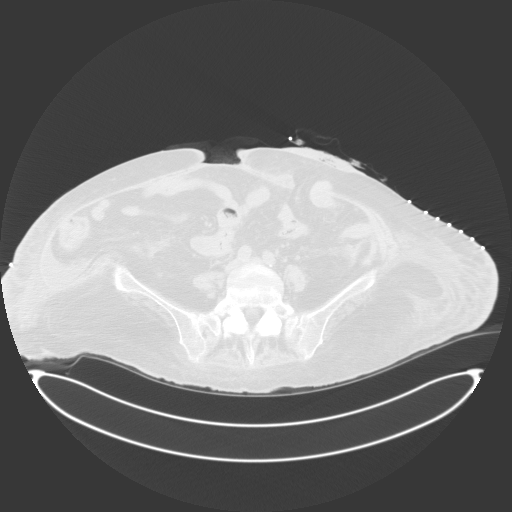
[im 37/45  soft-tissue]
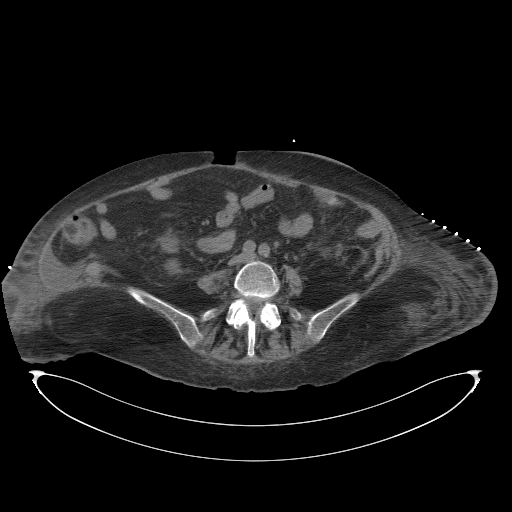
[im 37/45  lung]
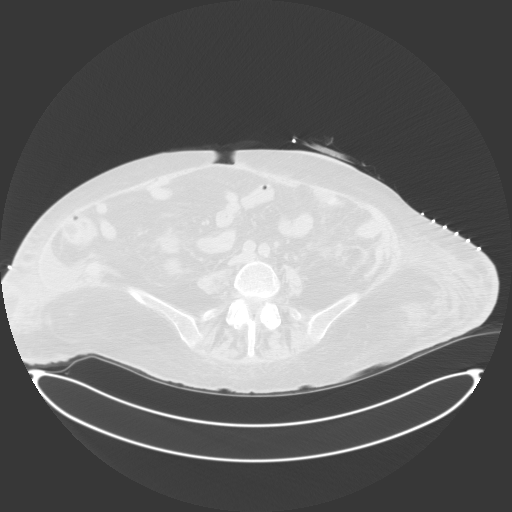
[im 37/45  bone]
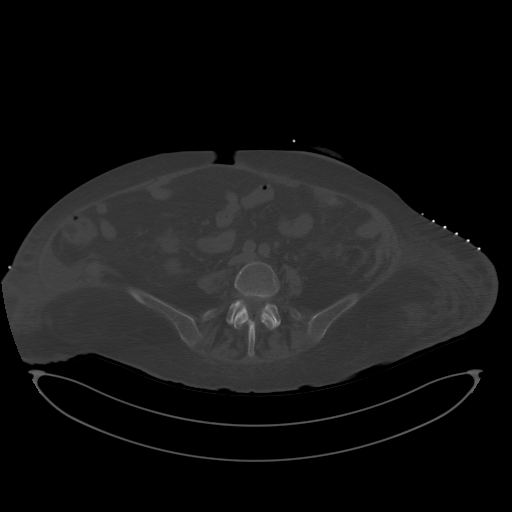
[im 41/45  soft-tissue]
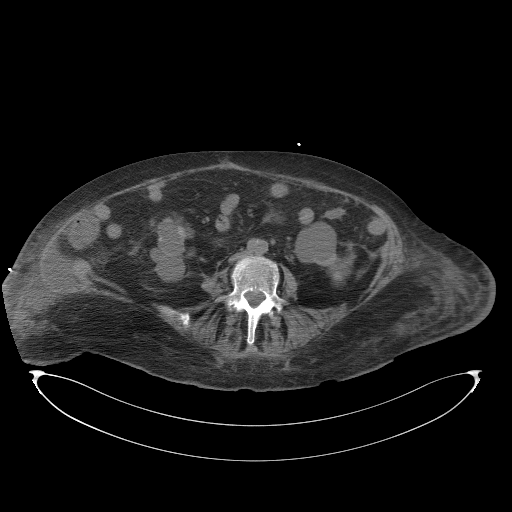
[im 41/45  lung]
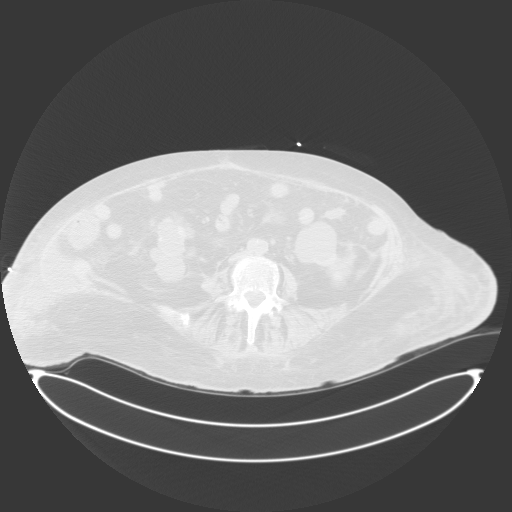

[Series 5: i-spiral 5.0 bf37 · axial · 0.98mm/px · z∈[+1141,+1155]mm · 2 of 27 slices shown (2 of 2)]
[im 4/27  soft-tissue]
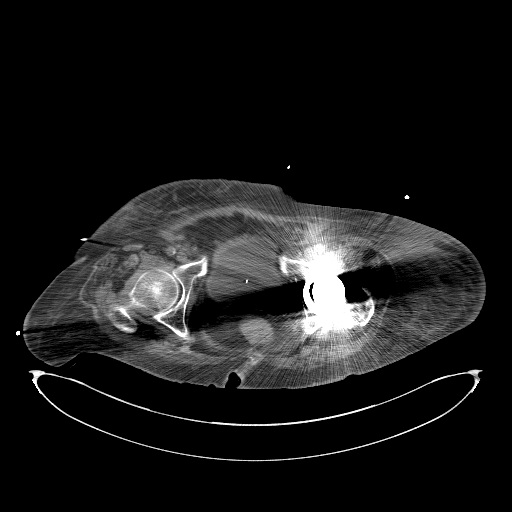
[im 8/27  soft-tissue]
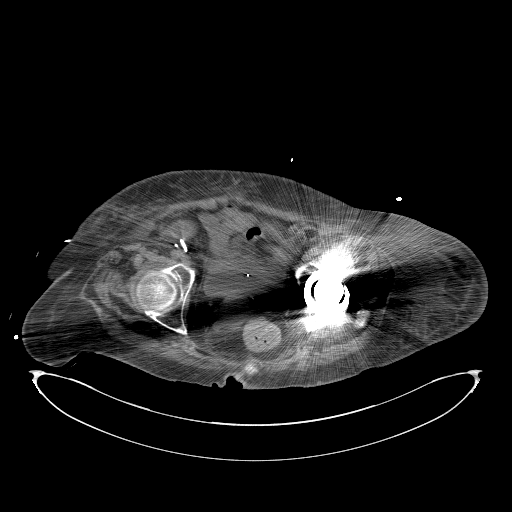

[12 of 32 positions shown; findings below may reference images not displayed]

EXAM:
CT GUIDED DRAINAGE OF LEFT LOWER QUADRANT ABSCESS

MEDICATIONS:
The patient is currently admitted to the hospital and receiving
intravenous antibiotics. The antibiotics were administered within an
appropriate time frame prior to the initiation of the procedure.

ANESTHESIA/SEDATION:
1 mg IV Versed 50 mcg IV Fentanyl

Moderate Sedation Time:  11 minutes

The patient was continuously monitored during the procedure by the
interventional radiology nurse under my direct supervision.

COMPLICATIONS:
None immediate.
PROCEDURE:
Patient positioned supine on the procedure table. The left lower
quadrant anterior abdominal wall skin was prepped with Chlorhexidine
in a sterile fashion, and a sterile drape was applied covering the
operative field. A sterile gown and sterile gloves were used for the
procedure. Local anesthesia was provided with 1% Lidocaine.

Following local lidocaine administration, 18 gauge needle was
advanced into the left lower quadrant collection utilizing CT
guidance.

Purulent material was noted to return from the needle. The needle
was removed over 0.035 inch guidewire. 10.2 French multipurpose
pigtail drain inserted over the guidewire. 30 mL of purulent
material aspirated. Samples sent for Gram stain and culture.

Drain secured to skin with suture and connected to bulb suction.
FINDINGS: Left lower quadrant abscess
IMPRESSION: 10.2 French multipurpose pigtail drain placed in left lower quadrant
abscess utilizing CT guidance.

## 2022-08-07 IMAGING — CT CT ABD-PELV W/O CM
2 of 4 series · 16 of 46 positions shown, 18 images · non-contrast
Comparison: Prior CT abdomen/pelvis 01/20/2021

CLINICAL DATA: 64-year-old female with a history of left lower
quadrant diverticular abscess status post percutaneous drain
placement on 01/24/2021.

EXAM:
CT ABDOMEN AND PELVIS WITHOUT CONTRAST
TECHNIQUE: Multidetector CT imaging of the abdomen and pelvis was performed
following the standard protocol without IV contrast.

[Series 2: axial st · axial · 0.97mm/px · z∈[-264,+166]mm · 13 of 98 slices shown, 15 images]
[im 6/98  soft-tissue]
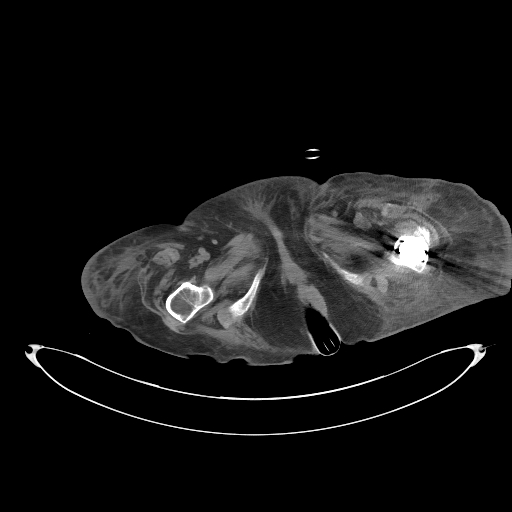
[im 6/98  bone]
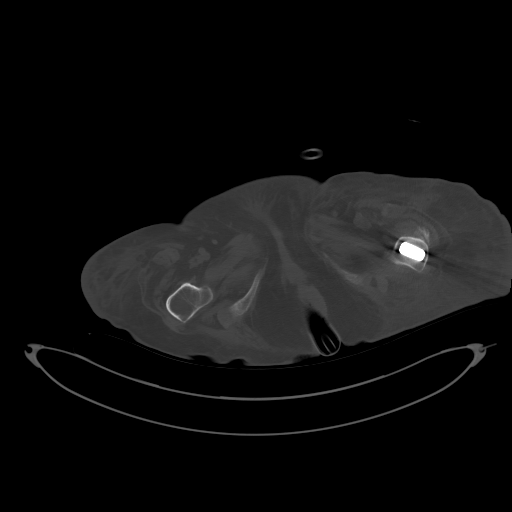
[im 16/98  soft-tissue]
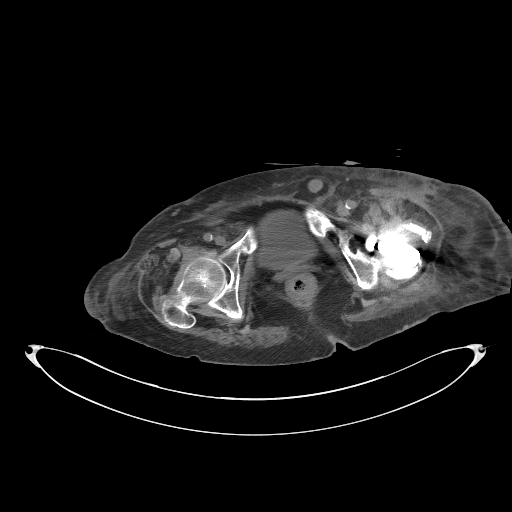
[im 21/98  soft-tissue]
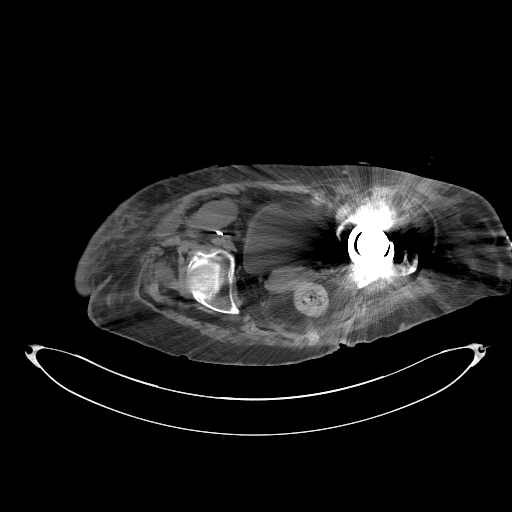
[im 26/98  soft-tissue]
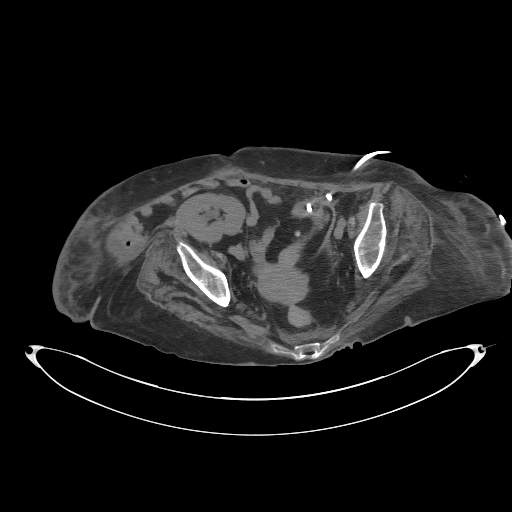
[im 36/98  soft-tissue]
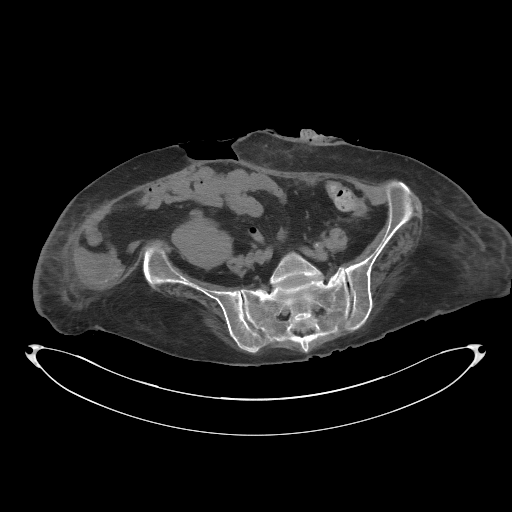
[im 41/98  soft-tissue]
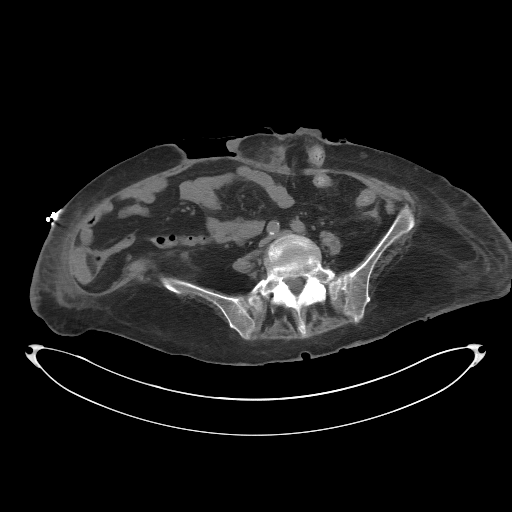
[im 52/98  soft-tissue]
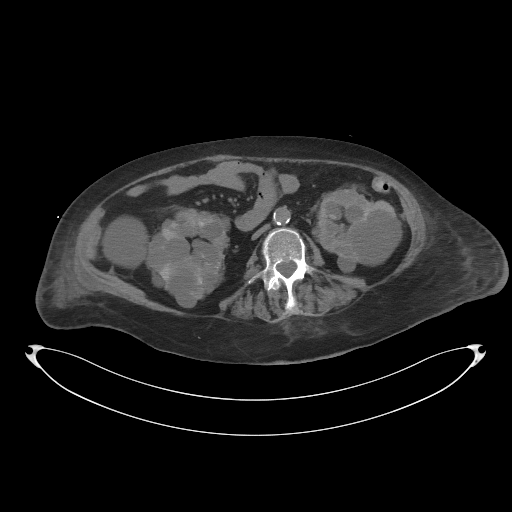
[im 57/98  soft-tissue]
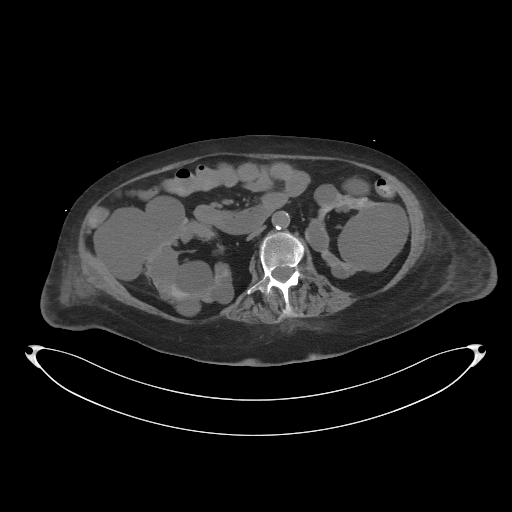
[im 62/98  soft-tissue]
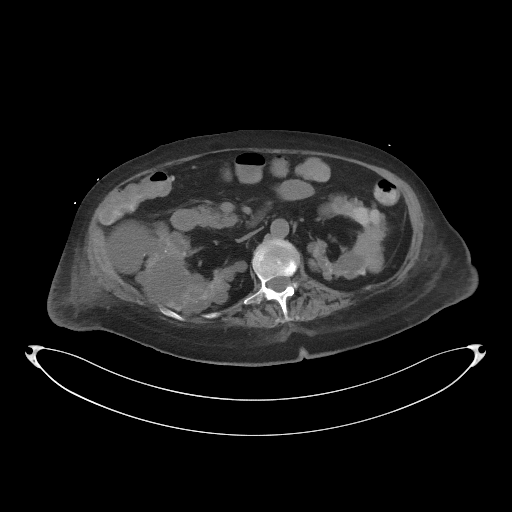
[im 62/98  bone]
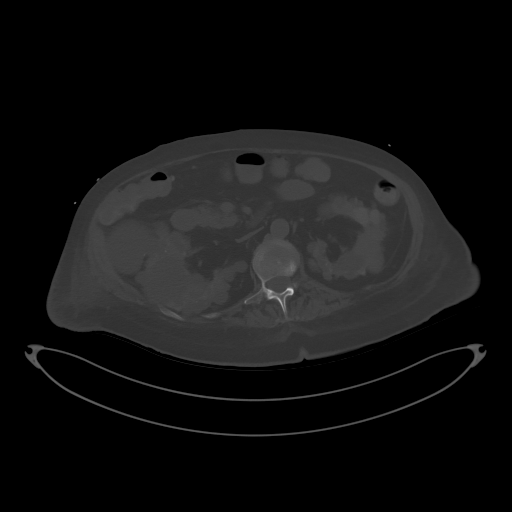
[im 72/98  soft-tissue]
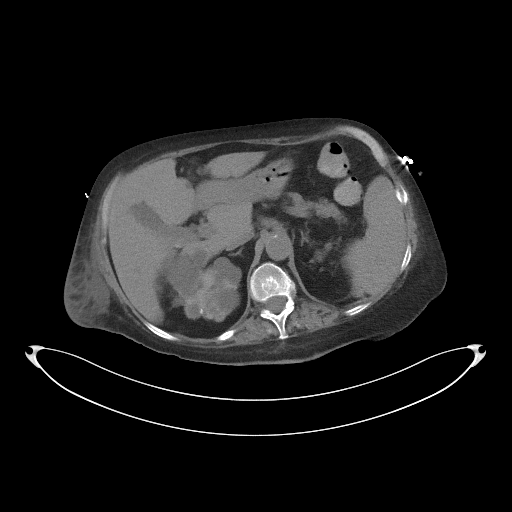
[im 77/98  soft-tissue]
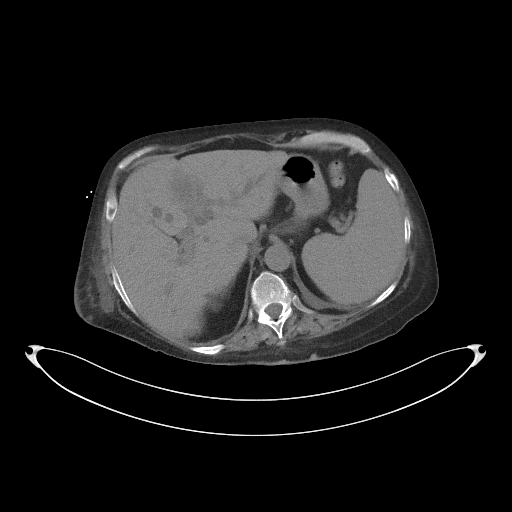
[im 82/98  soft-tissue]
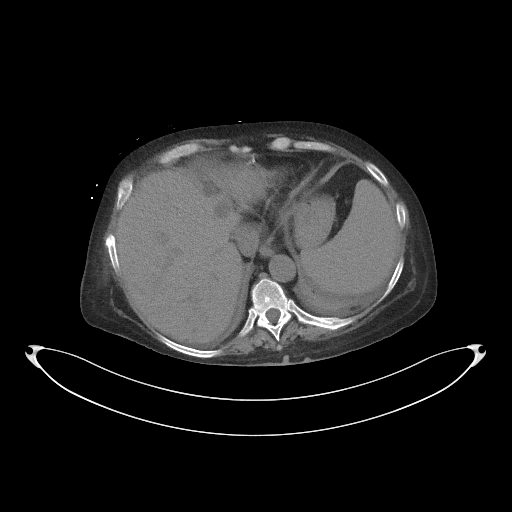
[im 92/98  soft-tissue]
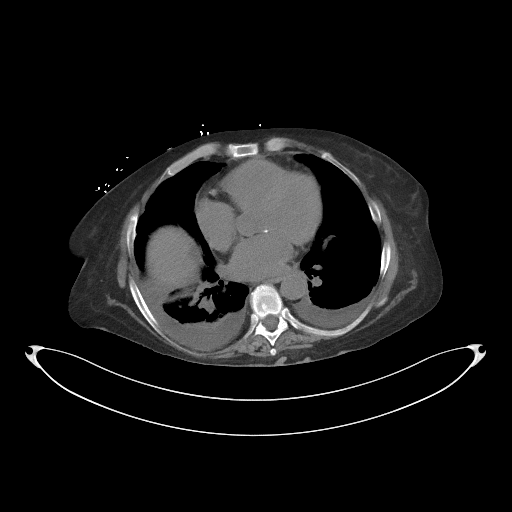

[Series 4: coronal st · coronal · 1.09mm/px · 3 of 77 slices shown]
[im 26/77  soft-tissue]
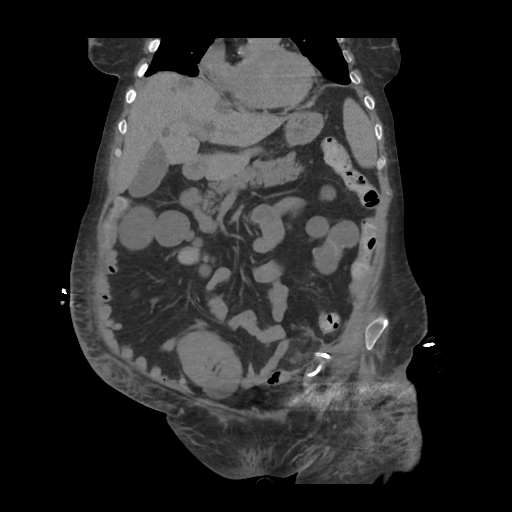
[im 34/77  soft-tissue]
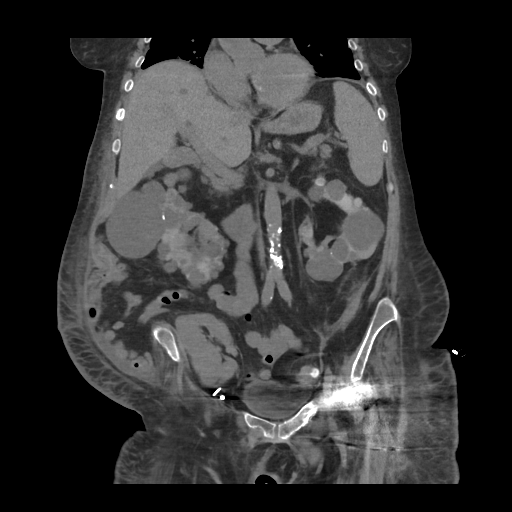
[im 43/77  soft-tissue]
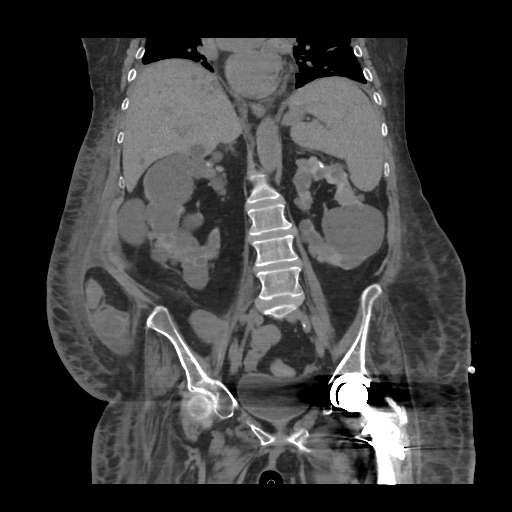

[16 of 46 positions shown; findings below may reference images not displayed]

FINDINGS: Lower chest: Small bilateral pleural effusions with associated
subsegmental atelectasis. The intracardiac blood pool is hypodense
relative to the adjacent myocardium consistent with anemia. No
pericardial effusion. Atherosclerotic calcifications visualized
along the coronary arteries.

Hepatobiliary: Numerous circumscribed low-attenuation liver lesions,
similar compared to prior imaging and likely reflecting simple
cysts. High attenuation material layers dependently within the
gallbladder lumen consistent with sludge and/or small stones. No
biliary ductal dilatation.

Pancreas: Unremarkable. No pancreatic ductal dilatation or
surrounding inflammatory changes.

Spleen: Normal in size without focal abnormality.

Adrenals/Urinary Tract: Unremarkable adrenal glands. Innumerable
circumscribed low-attenuation cysts throughout both kidneys. The
appearance is consistent with autosomal dominant polycystic kidney
disease. Unremarkable ureters and bladder. Transplant kidney in the
right lower quadrant without evidence of hydronephrosis.

Stomach/Bowel: Colonic diverticular disease without CT evidence of
active inflammation. Surgical changes of prior partial left
colectomy with Hartmann's pouch and left lower quadrant end
colostomy. A percutaneous drainage catheter is present adjacent to
the stump of the Hartmann's pouch.

Vascular/Lymphatic: Limited evaluation in the absence of intravenous
contrast. No evidence of aneurysm. Atherosclerotic calcifications
present throughout the abdominal aorta. No suspicious
lymphadenopathy.

Reproductive: Uterus and bilateral adnexa are unremarkable.

Other: Interval resolution of fluid collection from within the left
lower quadrant adjacent to the Hartmann's pouch stump. Small volume
ascites in the right pericolic gutter. Open midline incision. No
evidence of hernia.

Musculoskeletal: No acute osseous abnormality. Surgical changes of
prior left total hip arthroplasty. Dependent body wall edema.
IMPRESSION: 1. Interval resolution of left lower quadrant abscess collection
with well-positioned percutaneous drainage catheter.
2. Small bilateral pleural effusions with associated atelectasis.
3. Small volume free fluid in the right pericolic gutter.
4. The intracardiac blood pool is hypodense relative to the adjacent
myocardium consistent with anemia.
5. Additional ancillary findings as above without significant
interval change.

## 2022-08-10 IMAGING — XA IR FISTULA/SINUS TRACT
2 series · 7 of 7 positions shown · non-contrast
Comparison: 01/24/2021

CLINICAL DATA: 64-year-old female with history of diverticular
abscess and left lower quadrant pigtail drainage catheter placement
on 01/24/2021.

EXAM:
SINUS TRACT INJECTION/FISTULOGRAM
TECHNIQUE: The patient was positioned supine on the fluoroscopy table.
A preprocedural spot fluoroscopic image was obtained of the left
lower quadrant and the existing percutaneous drainage catheter.
Multiple spot fluoroscopic and radiographic images were obtained
following the injection of a small amount of contrast via the
existing percutaneous drainage catheter.

[Series 1: fl - angio · 4 of 190 frames shown]
[frame 12/190]
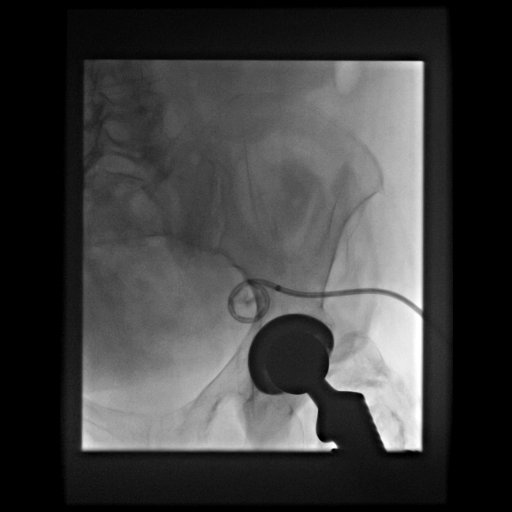
[frame 29/190]
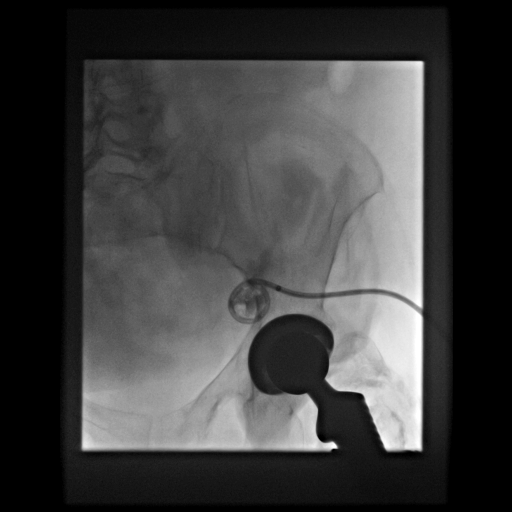
[frame 96/190]
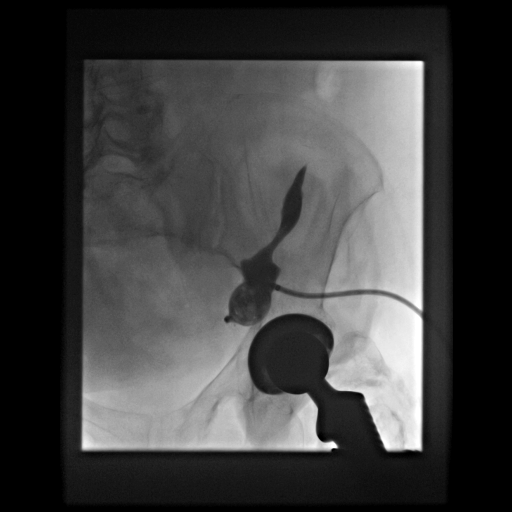
[frame 162/190]
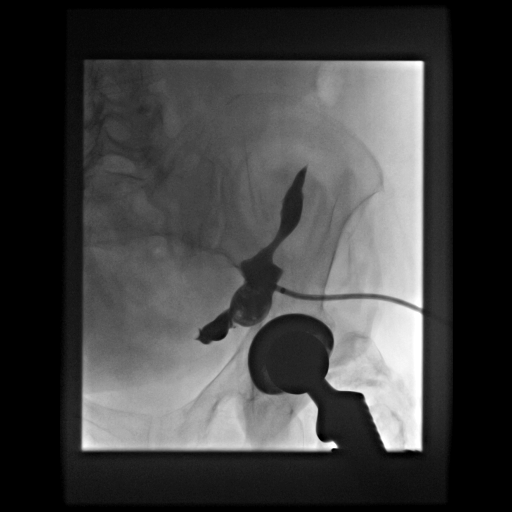

[Series 300: ir sinus/fist tube chk-non gi · 3 of 3 slices shown]
[im 1/3]
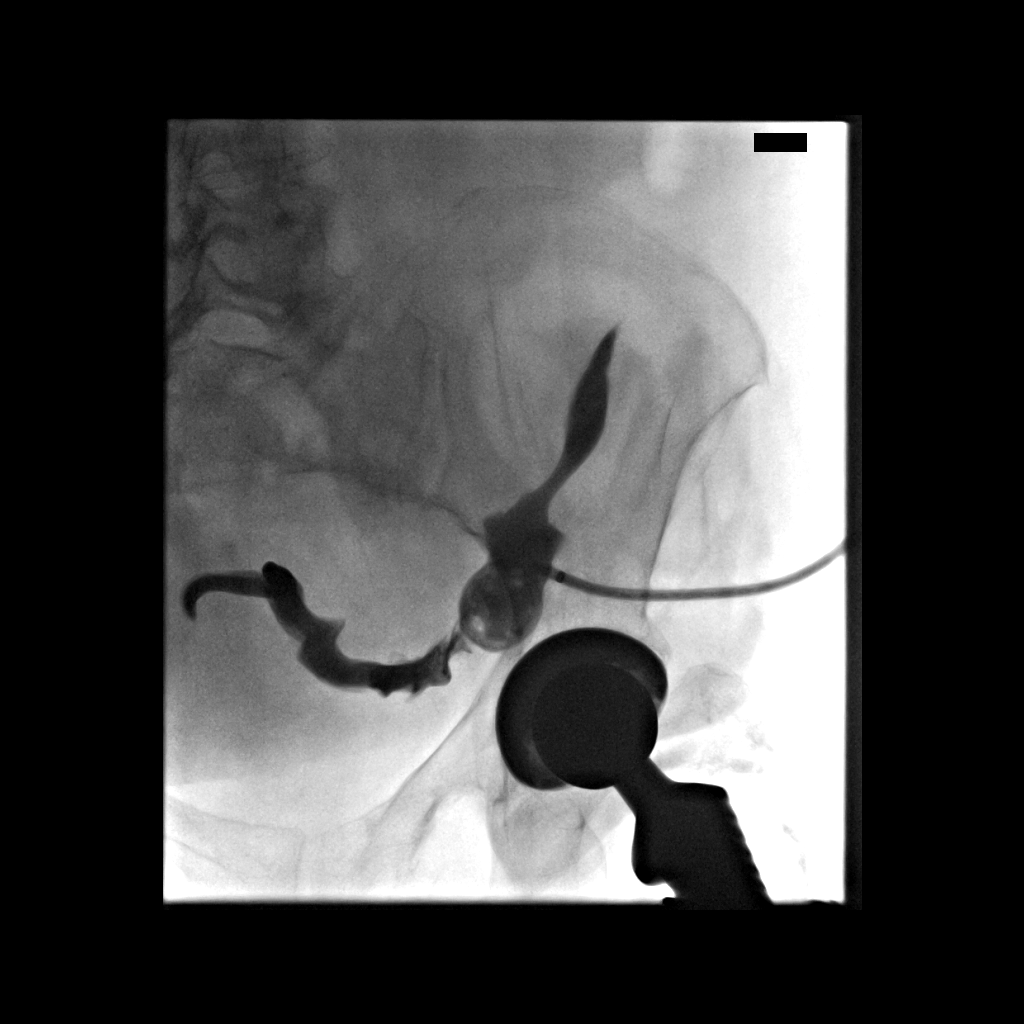
[im 2/3]
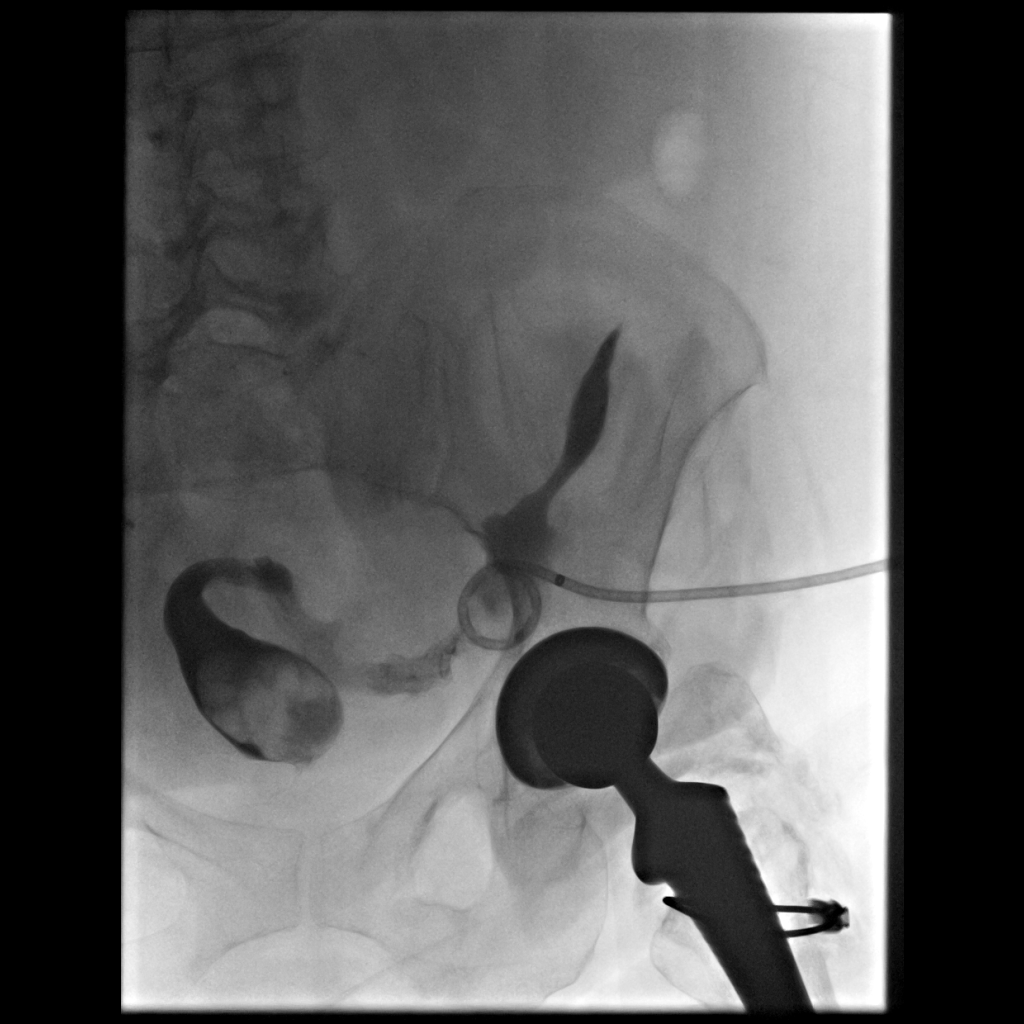
[im 3/3]
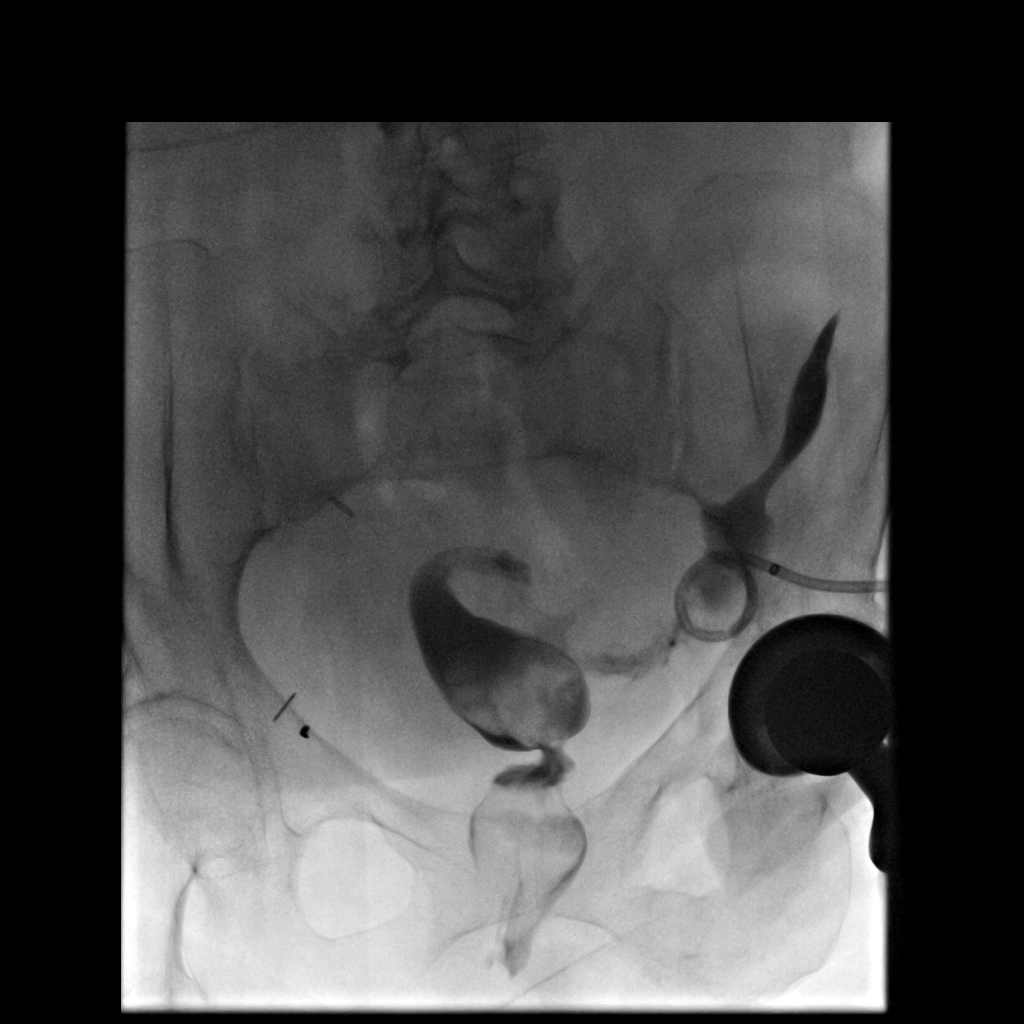

[7 of 7 positions shown; findings below may reference images not displayed]

CONTRAST:  10 mL Omnipaque 300-administered via the existing
percutaneous drain.

FLUOROSCOPY TIME:  42 seconds, 5 mGy
FINDINGS: Free flow of contrast material into the left pericolic gutter as
well as tracking medially and inferiorly in the pelvis. Delayed
images demonstrate opacification of the sigmoid colon and rectum.
IMPRESSION: Colonic fistula in communication with indwelling left lower quadrant
pigtail drainage catheter.

PLAN:
Keep drainage catheter to bulb suction. IR will continue to follow
while inpatient. Plan to follow up with repeat drain injection in 1
month.

## 2023-07-07 NOTE — Telephone Encounter (Signed)
Done
